# Patient Record
Sex: Male | Born: 1935
Health system: Southern US, Community
[De-identification: ages and names within clinical notes are randomized; demographics above are authoritative.]

## PROBLEM LIST (undated history)

## (undated) ENCOUNTER — Emergency Department (HOSPITAL_COMMUNITY): Admission: EM | Payer: Medicare Other

## (undated) DIAGNOSIS — F32A Depression, unspecified: Secondary | ICD-10-CM

## (undated) DIAGNOSIS — K219 Gastro-esophageal reflux disease without esophagitis: Secondary | ICD-10-CM

## (undated) DIAGNOSIS — J189 Pneumonia, unspecified organism: Secondary | ICD-10-CM

## (undated) DIAGNOSIS — Z466 Encounter for fitting and adjustment of urinary device: Secondary | ICD-10-CM

## (undated) DIAGNOSIS — Z9981 Dependence on supplemental oxygen: Secondary | ICD-10-CM

## (undated) DIAGNOSIS — F419 Anxiety disorder, unspecified: Secondary | ICD-10-CM

## (undated) DIAGNOSIS — J45909 Unspecified asthma, uncomplicated: Secondary | ICD-10-CM

## (undated) DIAGNOSIS — I4510 Unspecified right bundle-branch block: Secondary | ICD-10-CM

## (undated) DIAGNOSIS — K8020 Calculus of gallbladder without cholecystitis without obstruction: Secondary | ICD-10-CM

## (undated) DIAGNOSIS — M1990 Unspecified osteoarthritis, unspecified site: Secondary | ICD-10-CM

## (undated) DIAGNOSIS — I6529 Occlusion and stenosis of unspecified carotid artery: Secondary | ICD-10-CM

## (undated) DIAGNOSIS — K838 Other specified diseases of biliary tract: Secondary | ICD-10-CM

## (undated) DIAGNOSIS — J9610 Chronic respiratory failure, unspecified whether with hypoxia or hypercapnia: Secondary | ICD-10-CM

## (undated) DIAGNOSIS — R7989 Other specified abnormal findings of blood chemistry: Secondary | ICD-10-CM

## (undated) DIAGNOSIS — C4491 Basal cell carcinoma of skin, unspecified: Secondary | ICD-10-CM

## (undated) DIAGNOSIS — I10 Essential (primary) hypertension: Secondary | ICD-10-CM

## (undated) DIAGNOSIS — E785 Hyperlipidemia, unspecified: Secondary | ICD-10-CM

## (undated) DIAGNOSIS — J310 Chronic rhinitis: Secondary | ICD-10-CM

## (undated) DIAGNOSIS — D4959 Neoplasm of unspecified behavior of other genitourinary organ: Secondary | ICD-10-CM

## (undated) DIAGNOSIS — I2510 Atherosclerotic heart disease of native coronary artery without angina pectoris: Secondary | ICD-10-CM

## (undated) DIAGNOSIS — J449 Chronic obstructive pulmonary disease, unspecified: Secondary | ICD-10-CM

## (undated) DIAGNOSIS — I739 Peripheral vascular disease, unspecified: Secondary | ICD-10-CM

## (undated) DIAGNOSIS — I779 Disorder of arteries and arterioles, unspecified: Secondary | ICD-10-CM

## (undated) DIAGNOSIS — R55 Syncope and collapse: Secondary | ICD-10-CM

## (undated) DIAGNOSIS — Z9289 Personal history of other medical treatment: Secondary | ICD-10-CM

## (undated) DIAGNOSIS — I4891 Unspecified atrial fibrillation: Secondary | ICD-10-CM

## (undated) DIAGNOSIS — Z87442 Personal history of urinary calculi: Secondary | ICD-10-CM

## (undated) DIAGNOSIS — N400 Enlarged prostate without lower urinary tract symptoms: Secondary | ICD-10-CM

## (undated) DIAGNOSIS — I5030 Unspecified diastolic (congestive) heart failure: Secondary | ICD-10-CM

## (undated) HISTORY — DX: Chronic respiratory failure, unspecified whether with hypoxia or hypercapnia: J96.10

## (undated) HISTORY — PX: BASAL CELL CARCINOMA EXCISION: SHX1214

## (undated) HISTORY — DX: Peripheral vascular disease, unspecified: I73.9

## (undated) HISTORY — DX: Other specified diseases of biliary tract: K83.8

## (undated) HISTORY — PX: CARDIAC CATHETERIZATION: SHX172

## (undated) HISTORY — PX: LITHOTRIPSY: SUR834

## (undated) HISTORY — DX: Hyperlipidemia, unspecified: E78.5

## (undated) HISTORY — DX: Calculus of gallbladder without cholecystitis without obstruction: K80.20

## (undated) HISTORY — DX: Disorder of arteries and arterioles, unspecified: I77.9

## (undated) HISTORY — DX: Unspecified right bundle-branch block: I45.10

## (undated) HISTORY — DX: Occlusion and stenosis of unspecified carotid artery: I65.29

## (undated) HISTORY — PX: CATARACT EXTRACTION W/ INTRAOCULAR LENS  IMPLANT, BILATERAL: SHX1307

## (undated) HISTORY — DX: Chronic rhinitis: J31.0

## (undated) HISTORY — PX: TONSILLECTOMY: SUR1361

---

## 1999-12-27 ENCOUNTER — Ambulatory Visit (HOSPITAL_COMMUNITY): Admission: RE | Admit: 1999-12-27 | Discharge: 1999-12-27 | Payer: Self-pay

## 2000-05-09 ENCOUNTER — Other Ambulatory Visit: Admission: RE | Admit: 2000-05-09 | Discharge: 2000-05-09 | Payer: Self-pay | Admitting: Urology

## 2000-05-09 ENCOUNTER — Encounter (INDEPENDENT_AMBULATORY_CARE_PROVIDER_SITE_OTHER): Payer: Self-pay | Admitting: Specialist

## 2000-05-28 ENCOUNTER — Encounter: Payer: Self-pay | Admitting: Urology

## 2000-05-31 ENCOUNTER — Ambulatory Visit (HOSPITAL_COMMUNITY): Admission: RE | Admit: 2000-05-31 | Discharge: 2000-05-31 | Payer: Self-pay | Admitting: Urology

## 2000-05-31 ENCOUNTER — Encounter: Payer: Self-pay | Admitting: Urology

## 2000-11-05 ENCOUNTER — Ambulatory Visit (HOSPITAL_COMMUNITY): Admission: RE | Admit: 2000-11-05 | Discharge: 2000-11-05 | Payer: Self-pay | Admitting: Urology

## 2000-11-05 ENCOUNTER — Encounter: Payer: Self-pay | Admitting: Urology

## 2001-09-12 ENCOUNTER — Inpatient Hospital Stay (HOSPITAL_COMMUNITY): Admission: AD | Admit: 2001-09-12 | Discharge: 2001-09-23 | Payer: Self-pay | Admitting: Interventional Cardiology

## 2001-09-13 ENCOUNTER — Encounter: Payer: Self-pay | Admitting: Cardiothoracic Surgery

## 2001-09-14 ENCOUNTER — Encounter: Payer: Self-pay | Admitting: Cardiothoracic Surgery

## 2001-09-15 ENCOUNTER — Encounter: Payer: Self-pay | Admitting: Surgery

## 2001-09-17 ENCOUNTER — Encounter: Payer: Self-pay | Admitting: Cardiothoracic Surgery

## 2001-09-19 ENCOUNTER — Encounter: Payer: Self-pay | Admitting: Cardiology

## 2001-09-20 ENCOUNTER — Encounter: Payer: Self-pay | Admitting: Cardiothoracic Surgery

## 2001-09-22 ENCOUNTER — Encounter: Payer: Self-pay | Admitting: Cardiothoracic Surgery

## 2001-10-09 HISTORY — PX: CORONARY ARTERY BYPASS GRAFT: SHX141

## 2001-10-12 ENCOUNTER — Encounter (HOSPITAL_COMMUNITY): Admission: RE | Admit: 2001-10-12 | Discharge: 2002-01-10 | Payer: Self-pay | Admitting: Interventional Cardiology

## 2002-01-11 ENCOUNTER — Encounter (HOSPITAL_COMMUNITY): Admission: RE | Admit: 2002-01-11 | Discharge: 2002-01-20 | Payer: Self-pay | Admitting: Interventional Cardiology

## 2002-01-21 ENCOUNTER — Encounter (HOSPITAL_COMMUNITY): Admission: RE | Admit: 2002-01-21 | Discharge: 2002-04-21 | Payer: Self-pay | Admitting: Interventional Cardiology

## 2005-02-27 ENCOUNTER — Ambulatory Visit (HOSPITAL_COMMUNITY): Admission: RE | Admit: 2005-02-27 | Discharge: 2005-02-27 | Payer: Self-pay | Admitting: Interventional Cardiology

## 2005-03-23 ENCOUNTER — Ambulatory Visit: Payer: Self-pay | Admitting: Internal Medicine

## 2005-05-04 ENCOUNTER — Ambulatory Visit: Payer: Self-pay | Admitting: Internal Medicine

## 2005-05-22 ENCOUNTER — Ambulatory Visit: Payer: Self-pay | Admitting: Pulmonary Disease

## 2006-05-29 ENCOUNTER — Ambulatory Visit: Payer: Self-pay | Admitting: Pulmonary Disease

## 2006-05-29 ENCOUNTER — Ambulatory Visit: Payer: Self-pay | Admitting: Internal Medicine

## 2006-06-12 ENCOUNTER — Ambulatory Visit: Payer: Self-pay | Admitting: Internal Medicine

## 2006-06-27 ENCOUNTER — Ambulatory Visit: Payer: Self-pay | Admitting: Vascular Surgery

## 2007-01-28 DIAGNOSIS — E785 Hyperlipidemia, unspecified: Secondary | ICD-10-CM | POA: Insufficient documentation

## 2007-01-29 ENCOUNTER — Ambulatory Visit: Payer: Self-pay | Admitting: Internal Medicine

## 2007-01-29 DIAGNOSIS — J9612 Chronic respiratory failure with hypercapnia: Secondary | ICD-10-CM

## 2007-01-29 DIAGNOSIS — J310 Chronic rhinitis: Secondary | ICD-10-CM | POA: Insufficient documentation

## 2007-01-29 DIAGNOSIS — J449 Chronic obstructive pulmonary disease, unspecified: Secondary | ICD-10-CM | POA: Insufficient documentation

## 2007-01-29 DIAGNOSIS — J9611 Chronic respiratory failure with hypoxia: Secondary | ICD-10-CM | POA: Insufficient documentation

## 2007-07-09 ENCOUNTER — Ambulatory Visit: Payer: Self-pay | Admitting: Vascular Surgery

## 2007-08-29 ENCOUNTER — Ambulatory Visit: Payer: Self-pay | Admitting: Internal Medicine

## 2007-09-02 ENCOUNTER — Encounter: Admission: RE | Admit: 2007-09-02 | Discharge: 2007-09-02 | Payer: Self-pay | Admitting: Family Medicine

## 2007-09-30 ENCOUNTER — Encounter: Payer: Self-pay | Admitting: Internal Medicine

## 2007-10-01 ENCOUNTER — Ambulatory Visit: Payer: Self-pay | Admitting: Internal Medicine

## 2007-10-10 ENCOUNTER — Ambulatory Visit: Payer: Self-pay

## 2007-10-10 ENCOUNTER — Encounter: Payer: Self-pay | Admitting: Internal Medicine

## 2007-11-16 ENCOUNTER — Encounter: Payer: Self-pay | Admitting: Internal Medicine

## 2007-12-17 ENCOUNTER — Telehealth (INDEPENDENT_AMBULATORY_CARE_PROVIDER_SITE_OTHER): Payer: Self-pay | Admitting: *Deleted

## 2007-12-18 ENCOUNTER — Ambulatory Visit (HOSPITAL_BASED_OUTPATIENT_CLINIC_OR_DEPARTMENT_OTHER): Admission: RE | Admit: 2007-12-18 | Discharge: 2007-12-18 | Payer: Self-pay | Admitting: Internal Medicine

## 2007-12-18 ENCOUNTER — Encounter: Payer: Self-pay | Admitting: Internal Medicine

## 2007-12-24 ENCOUNTER — Ambulatory Visit: Payer: Self-pay | Admitting: Pulmonary Disease

## 2008-02-11 ENCOUNTER — Ambulatory Visit: Payer: Self-pay | Admitting: Internal Medicine

## 2008-02-11 DIAGNOSIS — G4733 Obstructive sleep apnea (adult) (pediatric): Secondary | ICD-10-CM | POA: Insufficient documentation

## 2008-02-24 ENCOUNTER — Inpatient Hospital Stay (HOSPITAL_COMMUNITY): Admission: EM | Admit: 2008-02-24 | Discharge: 2008-02-25 | Payer: Self-pay | Admitting: Emergency Medicine

## 2008-02-24 ENCOUNTER — Ambulatory Visit: Payer: Self-pay | Admitting: Internal Medicine

## 2008-03-26 ENCOUNTER — Ambulatory Visit: Payer: Self-pay | Admitting: Internal Medicine

## 2008-04-16 ENCOUNTER — Encounter: Admission: RE | Admit: 2008-04-16 | Discharge: 2008-07-08 | Payer: Self-pay | Admitting: Family Medicine

## 2008-06-05 ENCOUNTER — Ambulatory Visit: Payer: Self-pay | Admitting: Pulmonary Disease

## 2008-06-05 DIAGNOSIS — J209 Acute bronchitis, unspecified: Secondary | ICD-10-CM | POA: Insufficient documentation

## 2008-06-23 ENCOUNTER — Ambulatory Visit: Payer: Self-pay | Admitting: Vascular Surgery

## 2008-06-30 ENCOUNTER — Ambulatory Visit: Payer: Self-pay | Admitting: Internal Medicine

## 2008-10-15 ENCOUNTER — Ambulatory Visit: Payer: Self-pay | Admitting: Internal Medicine

## 2008-10-15 DIAGNOSIS — I10 Essential (primary) hypertension: Secondary | ICD-10-CM | POA: Insufficient documentation

## 2008-11-06 ENCOUNTER — Ambulatory Visit: Payer: Self-pay | Admitting: Internal Medicine

## 2008-11-18 ENCOUNTER — Telehealth (INDEPENDENT_AMBULATORY_CARE_PROVIDER_SITE_OTHER): Payer: Self-pay | Admitting: *Deleted

## 2008-11-19 ENCOUNTER — Ambulatory Visit: Payer: Self-pay | Admitting: Internal Medicine

## 2008-11-26 ENCOUNTER — Encounter (HOSPITAL_COMMUNITY): Admission: RE | Admit: 2008-11-26 | Discharge: 2009-01-08 | Payer: Self-pay | Admitting: Internal Medicine

## 2008-12-22 ENCOUNTER — Ambulatory Visit: Payer: Self-pay | Admitting: Internal Medicine

## 2008-12-23 LAB — CONVERTED CEMR LAB
BUN: 14 mg/dL (ref 6–23)
CO2: 29 meq/L (ref 19–32)
Calcium: 9.7 mg/dL (ref 8.4–10.5)
Chloride: 106 meq/L (ref 96–112)
Creatinine, Ser: 1 mg/dL (ref 0.4–1.5)
GFR calc non Af Amer: 76.93 mL/min (ref >60)
Glucose, Bld: 97 mg/dL (ref 70–99)
Potassium: 5.2 meq/L — ABNORMAL HIGH (ref 3.5–5.1)
Pro B Natriuretic peptide (BNP): 157 pg/mL — ABNORMAL HIGH (ref 0.0–100.0)
Sodium: 143 meq/L (ref 135–145)

## 2009-01-09 ENCOUNTER — Encounter (HOSPITAL_COMMUNITY): Admission: RE | Admit: 2009-01-09 | Discharge: 2009-03-15 | Payer: Self-pay | Admitting: Internal Medicine

## 2009-01-21 ENCOUNTER — Ambulatory Visit: Payer: Self-pay | Admitting: Internal Medicine

## 2009-01-21 DIAGNOSIS — R609 Edema, unspecified: Secondary | ICD-10-CM | POA: Insufficient documentation

## 2009-03-04 ENCOUNTER — Ambulatory Visit: Payer: Self-pay | Admitting: Internal Medicine

## 2009-05-03 ENCOUNTER — Ambulatory Visit: Payer: Self-pay | Admitting: Internal Medicine

## 2009-05-04 LAB — CONVERTED CEMR LAB
ALT: 20 units/L (ref 0–53)
AST: 26 units/L (ref 0–37)
Albumin: 4.6 g/dL (ref 3.5–5.2)
Alkaline Phosphatase: 80 units/L (ref 39–117)
BUN: 13 mg/dL (ref 6–23)
Bilirubin, Direct: 0.2 mg/dL (ref 0.0–0.3)
CO2: 31 meq/L (ref 19–32)
Calcium: 9.7 mg/dL (ref 8.4–10.5)
Chloride: 102 meq/L (ref 96–112)
Creatinine, Ser: 1.1 mg/dL (ref 0.4–1.5)
GFR calc non Af Amer: 68.86 mL/min (ref >60)
Glucose, Bld: 89 mg/dL (ref 70–99)
Potassium: 5.1 meq/L (ref 3.5–5.1)
Sodium: 141 meq/L (ref 135–145)
TSH: 2.76 microintl units/mL (ref 0.35–5.50)
Total Bilirubin: 1 mg/dL (ref 0.3–1.2)
Total Protein: 6.6 g/dL (ref 6.0–8.3)

## 2009-06-12 ENCOUNTER — Telehealth: Payer: Self-pay | Admitting: Internal Medicine

## 2009-06-17 ENCOUNTER — Telehealth (INDEPENDENT_AMBULATORY_CARE_PROVIDER_SITE_OTHER): Payer: Self-pay | Admitting: *Deleted

## 2009-06-24 ENCOUNTER — Ambulatory Visit: Payer: Self-pay | Admitting: Vascular Surgery

## 2009-08-19 ENCOUNTER — Telehealth (INDEPENDENT_AMBULATORY_CARE_PROVIDER_SITE_OTHER): Payer: Self-pay | Admitting: *Deleted

## 2009-08-19 ENCOUNTER — Ambulatory Visit: Payer: Self-pay | Admitting: Internal Medicine

## 2009-08-19 DIAGNOSIS — J984 Other disorders of lung: Secondary | ICD-10-CM | POA: Insufficient documentation

## 2009-09-02 ENCOUNTER — Ambulatory Visit: Payer: Self-pay | Admitting: Internal Medicine

## 2009-11-04 ENCOUNTER — Ambulatory Visit: Payer: Self-pay | Admitting: Internal Medicine

## 2009-11-08 ENCOUNTER — Telehealth: Payer: Self-pay | Admitting: Internal Medicine

## 2009-11-22 ENCOUNTER — Ambulatory Visit: Payer: Self-pay | Admitting: Internal Medicine

## 2009-11-23 ENCOUNTER — Telehealth (INDEPENDENT_AMBULATORY_CARE_PROVIDER_SITE_OTHER): Payer: Self-pay | Admitting: *Deleted

## 2009-12-15 ENCOUNTER — Telehealth: Payer: Self-pay | Admitting: Internal Medicine

## 2009-12-16 ENCOUNTER — Encounter (HOSPITAL_COMMUNITY)
Admission: RE | Admit: 2009-12-16 | Discharge: 2010-02-08 | Payer: Self-pay | Source: Home / Self Care | Attending: Internal Medicine | Admitting: Internal Medicine

## 2009-12-20 ENCOUNTER — Encounter: Payer: Self-pay | Admitting: Internal Medicine

## 2010-01-12 ENCOUNTER — Telehealth (INDEPENDENT_AMBULATORY_CARE_PROVIDER_SITE_OTHER): Payer: Self-pay | Admitting: *Deleted

## 2010-01-28 ENCOUNTER — Telehealth (INDEPENDENT_AMBULATORY_CARE_PROVIDER_SITE_OTHER): Payer: Self-pay | Admitting: *Deleted

## 2010-02-08 NOTE — Progress Notes (Signed)
Summary: neb machine rx - hold for MW to sign script  Phone Note Call from Patient Call back at Home Phone [phone]   Caller: Patient Call For: LKGM Reason for Call: Talk to Nurse Summary of Call: While patient was out of town in [place] he was started on a nebulizer (that he is renting).  Calling to ask Dr. Sherene Sires for rx for a nebulizer, so he can purchase one here. Initial call taken by: Lehman Prom,  November 08, 2009 12:12 PM  Follow-up for Phone Call        called spoke with pt who states he was [place] last month and began having SOB and wheezing while there and was started on ipratropium and xopenex while there and would like to know if MW will write an rx for this.  states he forgot to mention this at the last ov.  would like the rx for machine WRITTEN OUT rather than sent to a dme company.  refills of the nebs meds can go to the [facility] on [address].  pt stated that he could not get to his medications while i was on the phone to update the strengths.  please advise if okay for the rx's thanks!  *pt aware MW out of the office this afternoon.  okay with call back tomorrow. Follow-up by: Boone Master CNA/MA,  November 08, 2009 2:56 PM  Additional Follow-up for Phone Call Additional follow up Details #1::        the plan was to take care of all this when he returns to do med rec with tammy - if will run out before ov needs duoneb but just given enough to make it to ov, not a year's supply Additional Follow-up by: Nyoka Cowden MD,  November 08, 2009 3:16 PM    Additional Follow-up for Phone Call Additional follow up Details #2::    i'm sorry Dr. Sherene Sires, but may pt have the rx for the machine?  he stated that the rental agreement on it runs out tomorrow.  thanks!   ~ pt okay with this being done tomorrow. Boone Master CNA/MA  November 08, 2009 3:28 PM  ok Follow-up by: Nyoka Cowden MD,  November 08, 2009 3:58 PM  Additional Follow-up for Phone Call Additional follow up  Details #3:: Details for Additional Follow-up Action Taken: neb machine added to pt's med list and printed off for MW to sign.  leslie is aware.   Boone Master CNA/MA  November 08, 2009 4:33 PM   Rx for neb machine was signed by MW.  Unsure if he wants to pick this up or have it mailed.  Will place in envelope in triage.  LMOMTCB [name]  November 09, 2009 2:09 PM  pt wants to pick up rx. placed at front.pt aware.Carron Curie CMA  November 09, 2009 4:09 PM   New/Updated Medications: * NEBULIZER MACHINE OF CHOICE use as directed for breathing treatments Prescriptions: NEBULIZER MACHINE OF CHOICE use as directed for breathing treatments  #1 x 0   Entered by:   Boone Master CNA/MA   Authorized by:   Nyoka Cowden MD   Signed by:   Boone Master CNA/MA on 11/08/2009   Method used:   Print then Give to Patient   RxID:   [id number]

## 2010-02-08 NOTE — Progress Notes (Signed)
Summary: new rx / xopenex > ok  Phone Note Call from Patient Call back at [facility] Phone [phone]   Caller: Patient Call For: [name] Summary of Call: pt needs a new rx for xopenex. pt says that this was previously prescribed by another dr "[place] of state". [facility] [address] pt states that he usually gets 60 days rx.  call pt to confirm.  Initial call taken by: Tivis Ringer, CNA,  December 15, 2009 2:58 PM  Follow-up for Phone Call        called spoke with patient who verified that he is requesting that MW begin filling his xopenex 1.25mg  neb solution.  Dr. Sherene Sires, are you okay with filling this med? Boone Master CNA/MA  December 15, 2009 4:03 PM  ok  Follow-up by: Nyoka Cowden MD,  December 15, 2009 7:29 PM  Additional Follow-up for Phone Call Additional follow up Details #1::        rx sent. pt aware.Carron Curie CMA  December 16, 2009 9:07 AM     Prescriptions: XOPENEX 1.25 MG/3ML NEBU (LEVALBUTEROL HCL) inhale 1 vial via HHN two times a day  #60 x 6   Entered by:   Carron Curie CMA   Authorized by:   Nyoka Cowden MD   Signed by:   Carron Curie CMA on 12/16/2009   Method used:   Electronically to        [facility]. [phone]* (retail)       [address]       [place], [place]  [address]       Ph: [phone]       Fax: [phone]   RxID:   [id number]

## 2010-02-08 NOTE — Miscellaneous (Signed)
Summary: Orders Update pft charges  Clinical Lists Changes  Orders: Added new Service order of Carbon Monoxide diffusing w/capacity (94720) - Signed Added new Service order of Lung Volumes (94240) - Signed Added new Service order of Spirometry (Pre & Post) (94060) - Signed 

## 2010-02-08 NOTE — Assessment & Plan Note (Signed)
Summary: Pulmonary/ ext f/u ov for copd   Visit Type:  extended fu Primary Provider/Referring Provider:  Theresia [name]  CC:  3 month followup.  Pt c/o increased sob since this am.  He states that he gets sob on exertion- he states that he can get sob just walking short distances..  History of Present Illness: 77 yowm last smoked 1983  severe COPD> FEV1 of only 36% April 2007 with a 29% improvement after bronchodilators consistent with an asthmatic component.   Baseline activity level--walks 1 mile daily with oxygen on. Maintained on Symbicort 160/4.80mcg 1 puff two times a day.   October 01, 2007  doe x 300 ft slt incline without 02 walking to shed has to stop, some worse x 6 mo rec spiriva and as needed xopenox, some better with xopenex   February 11, 2008 ov never took spiriva consistently.  No variability to doe or sign cough. rec spiriva but w/in one week cp > MCH with neg cardiac wu, still having occ cp , positional, better with rubbing chest and relaxing.  March 26, 2008 doe x 30 min mile on 02, in retrospect better on spiriva.no cough. no itching sneezing or wheezing. Try spiriva every other for several weeks then take daily, think of it like high octane fuel for your car - stop spiriva if you don't feel it's helping you ex performance or if you have any side effects Please schedule a follow-up appointment in 3 months > never took   June 30, 2008 ov no longer walking on 02, does walk to shed and back without 02 and without stopping but confused with instructions on how to take rescue rx, having variable doe minimal cough no noct co'ls. Pt denies any significant sore throat, nasal congestion or excess secretions, fever, chills, sweats, unintended wt loss, pleuritic or exertional cp, orthopnea pnd or leg swelling.  Pt also denies any obvious fluctuation in symptoms with weather or environmental change or other alleviating or aggravating factors except worse in heat.  Current Medications  (verified): 1)  Toprol Xl 25 Mg  Tb24 (Metoprolol Succinate) .... Once Daily 2)  Lipitor 20 Mg  Tabs (Atorvastatin Calcium) .... Once Daily 3)  Ascriptin 325 Mg Tabs (Aspirin Buf(Alhyd-Mghyd-Cacar)) .... Once Daily 4)  Atacand 8 Mg  Tabs (Candesartan Cilexetil) .... Once Daily 5)  Multivitamins   Tabs (Multiple Vitamin) .... Once Daily 6)  Oxygen 2 Liters Qhs and Prn 7)  Symbicort 160-4.5 Mcg/act  Aero (Budesonide-Formoterol Fumarate) .Marland Kitchen.. 1-2 Puffs Every 12 Hours 8)  Xopenex Hfa 45 Mcg/act Aero (Levalbuterol Tartrate) .... 2 Every 4 Hours If Needed  Allergies (verified): 1)  ! * Omnicef 2)  Sulfa  Past History:  Past Medical History: CHRONIC RHINITIS (ICD-472.0) CHRONIC RESPIRATORY FAILURE ([id number]-782.95) COPD UNSPECIFIED (ICD-496)   - PFTs 05/04/05 FEV1 36% ratio 28% with 29% improvement after bronchodilators    DLCO 48%   - PFT's 03/26/08          30% ratio 34      with 14% improvement after bronchodilators  DLC0 38 %   - Start noct 02 at 2lpm 2008   - [facility] June 30, 2008 100%   - Rechallenge with spiriva June 30, 2008  ISCHEMIC HEART DISEASE (ICD-414.9) HYPERTENSION (ICD-401.9) HYPERLIPIDEMIA (ICD-272.4)    Vital Signs:  Patient profile:   77 year old male Weight:      160 pounds O2 Sat:      97 % on Room air Temp:  98.2 degrees F oral Pulse rate:   75 / minute BP sitting:   160 / 82  (left arm)  Vitals Entered By: [name] (June 30, 2008 11:26 AM)  O2 Flow:  Room air  Physical Exam  Additional Exam:  relatively robust  elderly white male in no acute distress but quite anxious wt  159 October 01, 2007  > wt 164 February 11, 2008 > 159 March 26, 2008 > 160 June 30, 2008  HEENT mild turbinate edema.  Oropharynx no thrush or excess pnd or cobblestoning.  No JVD or cervical adenopathy. Mild accessory muscle hypertrophy. Trachea midline, nl thryroid. Chest was hyperinflated by percussion with diminished breath sounds and marked increased exp time without wheeze.  Hoover sign positive onset of inspiration. Regular rate and rhythm without murmur gallop or rub or increase P2. Decrease s1s2  No edema Abd: no hsm, nl excursion. Ext warm without cyanosis or clubbing.   Impression & Recommendations:  Problem # 1:  COPD UNSPECIFIED (ICD-496)  the pt has severe emphysema with asthmatic component.  he has not really had a fair trial of aggressive maint rx.  I had an extended discussion with the patient today lasting 15 to 20 minutes of a 25 minute visit on the following issues:   try spiriva again. See instructions for specific recommendations   I spent extra time with the patient today explaining optimal mdi  technique.  This improved from  90-100%   Each maintenance medication was reviewed in detail including most importantly the difference between maintenance and as needed and under what circumstances the prns are to be used. See instructions for specific recommendations   Problem # 2:  HYPERTENSION (ICD-401.9) BP not great and needs more selective Beta blocker based on documented Beta agonist responsiveness  The following medications were removed from the medication list:    Toprol Xl 25 Mg Tb24 (Metoprolol succinate) ..... Once daily His updated medication list for this problem includes:    Atacand 8 Mg Tabs (Candesartan cilexetil) ..... Once daily    Bystolic 5 Mg Tabs (Nebivolol hcl) ..... One tablet daily  Medications Added to Medication List This Visit: 1)  Oxygen 2 Liters At Bedtime and With Exertion  2)  Symbicort 160-4.5 Mcg/act Aero (Budesonide-formoterol fumarate) .... 2 puffs first thing  in am and 2 puffs again in pm about 12 hours later 3)  Spiriva Handihaler 18 Mcg Caps (Tiotropium bromide monohydrate) .... Two puffs in handihaler daily 4)  Bystolic 5 Mg Tabs (Nebivolol hcl) .... One tablet daily  Complete Medication List: 1)  Lipitor 20 Mg Tabs (Atorvastatin calcium) .... Once daily 2)  Ascriptin 325 Mg Tabs (Aspirin  buf(alhyd-mghyd-cacar)) .... Once daily 3)  Atacand 8 Mg Tabs (Candesartan cilexetil) .... Once daily 4)  Multivitamins Tabs (Multiple vitamin) .... Once daily 5)  Oxygen 2 Liters At Bedtime and With Exertion  6)  Symbicort 160-4.5 Mcg/act Aero (Budesonide-formoterol fumarate) .... 2 puffs first thing  in am and 2 puffs again in pm about 12 hours later 7)  Xopenex Hfa 45 Mcg/act Aero (Levalbuterol tartrate) .... 2 every 4 hours if needed 8)  Spiriva Handihaler 18 Mcg Caps (Tiotropium bromide monohydrate) .... Two puffs in handihaler daily 9)  Bystolic 5 Mg Tabs (Nebivolol hcl) .... One tablet daily  Other Orders: Est. Patient Level IV (16109)  Patient Instructions: 1)  stop toprol and start bystolic 5 mg daily 2)  when you walk for exercise wear the oxygen 3)  continue symbicort  160 2 puffs first thing  in am and 2 puffs again in pm about 12 hours later 4)  Try spiriva every other for several weeks then take daily, think of it like high octane fuel for your car - stop spiriva if you don't feel it's helping you ex performance or if you have any side effects 5)  Please schedule a follow-up appointment in 1 month

## 2010-02-08 NOTE — Assessment & Plan Note (Signed)
Summary: Pulmonary/ fu copd/pft's/rechallenge with spiriva   Primary Provider/Referring Provider:  [name]  CC:  fu visit after pft...stopped spiriva due to complications...coughing in AM.  History of Present Illness: 77 yowm last smoked 1983  severe COPD with an FEV1 of only 36% April 2007 with a 29% improvement after bronchodilators consistent with an asthmatic component.   Baseline activity level--walks 1 mile daily with oxygen on. Maintained on Symbicort 160/4.45mcg 1 puff two times a day.   October 01, 2007  doe x 300 ft slt incline without 02 walking to shed has to stop, some worse x 6 mo rec spiriva and as needed xopenox, some better with xopenex    February 11, 2008 ov never took spiriva consistently.  No variability to doe or sign cough. rec spiriva but w/in one week cp > MCH with neg cardiac wu, still having occ cp , positional, better with rubbing chest and relaxing.  March 26, 2008 doe x 30 min mile on 02, in retrospect better on spiriva.no cough. no itching sneezing or wheezing. Pt denies any significant sore throat, nasal congestion or excess secretions, fever, chills, sweats, unintended wt loss, pleuritic or exertional cp, orthopnea pnd or leg swelling.  Pt also denies any obvious fluctuation in symptoms with weather or environmental change or other alleviating or aggravating factors.     Pt denies any increase in rescue therapy over baseline, denies waking up needing it or having early am exacerbations of coughing/wheezing/ or dyspnea   Current Medications (verified): 1)  Toprol Xl 25 Mg  Tb24 (Metoprolol Succinate) .... Once Daily 2)  Lipitor 20 Mg  Tabs (Atorvastatin Calcium) .... Once Daily 3)  Ascriptin 325 Mg Tabs (Aspirin Buf(Alhyd-Mghyd-Cacar)) .... Once Daily 4)  Atacand 8 Mg  Tabs (Candesartan Cilexetil) .... Once Daily 5)  Multivitamins   Tabs (Multiple Vitamin) .... Once Daily 6)  Oxygen 2 Liters Qhs and Prn 7)  Symbicort 160-4.5 Mcg/act  Aero  (Budesonide-Formoterol Fumarate) .Marland Kitchen.. 1-2 Puffs Every 12 Hours 8)  Xopenex Hfa 45 Mcg/act Aero (Levalbuterol Tartrate) .... 2 Every 4 Hours If Needed  Allergies (verified): 1)  Sulfa  Past History:  Past Medical History:    CHRONIC RHINITIS (ICD-472.0)    CHRONIC RESPIRATORY FAILURE (ZOX-096.04)    COPD UNSPECIFIED (ICD-496)      - PFTs 05/04/05 FEV1 36% ratio 28% with 29% improvement after bronchodilators    DLCO 48%      - PFT's 03/26/08          30% ratio 34      with 14% improvement after bronchodilators  DLC0 38 %      - Start noct 02 at 2lpm 2008    ISCHEMIC HEART DISEASE (ICD-414.9)    HYPERTENSION (ICD-401.9)    HYPERLIPIDEMIA (ICD-272.4)       Family History:    Reviewed history from 01/29/2007 and no changes required:       allergies and brother son heart disease in mother father two brothers  Social History:    Reviewed history from 01/29/2007 and no changes required:        Quit smoking 1983  Review of Systems  The patient denies shortness of breath at rest, productive cough, non-productive cough, coughing up blood, chest pain, irregular heartbeats, acid heartburn, indigestion, loss of appetite, weight change, abdominal pain, difficulty swallowing, sore throat, tooth/dental problems, headaches, nasal congestion/difficulty breathing through nose, sneezing, itching, ear ache, anxiety, depression, hand/feet swelling, joint stiffness or pain, rash, change in color of mucus, and  fever.    Vital Signs:  Patient profile:   77 year old male Height:      76 inches Weight:      159 pounds BMI:     19.42 O2 Sat:      93 % Temp:     98.1 degrees F oral Pulse rate:   75 / minute BP sitting:   110 / 60  (left arm) Cuff size:   regular  Vitals Entered By: Clarise Cruz Duncan Dull) (March 26, 2008 12:13 PM)  O2 Sat at Rest %:  93 O2 Flow:  room air  Physical Exam  Additional Exam:  relatively robust  elderly white male in no acute distress wt  159 October 01, 2007  > wt  164 February 11, 2008 > 159 March 26, 2008  HEENT mild turbinate edema.  Oropharynx no thrush or excess pnd or cobblestoning.  No JVD or cervical adenopathy. Mild accessory muscle hypertrophy. Trachea midline, nl thryroid. Chest was hyperinflated by percussion with diminished breath sounds and marked increased exp time without wheeze. Hoover sign positive onset of inspiration. Regular rate and rhythm without murmur gallop or rub or increase P2. Decrease s1s2 Abd: no hsm, nl excursion. Ext warm without C,C or E.    CXR  Procedure date:  02/24/2008  Findings:      copd only  Pulmonary Function Test Height (in.): 76 Gender: Male  Impression & Recommendations:  Problem # 1:  COPD UNSPECIFIED (ICD-496) I had an extended discussion with the patient today lasting 15 to 20 minutes of a 25 minute visit on the following issues:   I spent extra time with the patient today explaining optimal mdi  technique.  This improved from  90-100%  Pft's reviewed with pt showing minimal change since last study, classic COPD with AB and emphysema components, feel he needs  both symbicort and spiriva.  Reasonable to rechallenge with spiriva and if can't tolerate it change to combivent as "rescue"  Complete Medication List: 1)  Toprol Xl 25 Mg Tb24 (Metoprolol succinate) .... Once daily 2)  Lipitor 20 Mg Tabs (Atorvastatin calcium) .... Once daily 3)  Ascriptin 325 Mg Tabs (Aspirin buf(alhyd-mghyd-cacar)) .... Once daily 4)  Atacand 8 Mg Tabs (Candesartan cilexetil) .... Once daily 5)  Multivitamins Tabs (Multiple vitamin) .... Once daily 6)  Oxygen 2 Liters Qhs and Prn  7)  Symbicort 160-4.5 Mcg/act Aero (Budesonide-formoterol fumarate) .Marland Kitchen.. 1-2 puffs every 12 hours 8)  Xopenex Hfa 45 Mcg/act Aero (Levalbuterol tartrate) .... 2 every 4 hours if needed  Other Orders: Est. Patient Level IV (04540)  Patient Instructions: 1)  Try spiriva every other for several weeks then take daily, think of it like high  octane fuel for your car - stop spiriva if you don't feel it's helping you ex performance or if you have any side effects 2)  Please schedule a follow-up appointment in 3 months.  Appended Document: Pulmonary/ fu copd/pft's/rechallenge with spiriva copy to [facility]

## 2010-02-08 NOTE — Assessment & Plan Note (Signed)
Summary: Pulmonary/ f/u ov try short course prednisone   Primary Provider/Referring Provider:  [name]  CC:  1 month followup.  Pt states that cough has resolved.  He still increased SOB with exertion.  [name]  History of Present Illness: 35 yowm last smoked 1983  severe COPD> FEV1 of only 36% April 2007 with a 29% improvement after bronchodilators consistent with an asthmatic component.   Baseline activity level--walks 1 mile daily with oxygen on. Maintained on Symbicort 160/4.43mcg 1 puff two times a day.    February 11, 2008 ov never took spiriva consistently.  No variability to doe or sign cough. rec spiriva but w/in one week cp > MCH with neg cardiac wu, still having occ cp , positional, better with rubbing chest and relaxing.  March 26, 2008 doe x 30 min mile on 02, in retrospect better on spiriva.no cough. no itching sneezing or wheezing. Try spiriva every other for several weeks then take daily, think of it like high octane fuel for your car - stop spiriva if you don't feel it's helping you ex performance or if you have any side effects Please schedule a follow-up appointment in 3 months > never took   June 30, 2008 ov no longer walking on 02, does walk to shed and back without 02 and without stopping but confused with instructions on how to take rescue rx, having variable doe minimal cough no noct c/o's.  rec trial of spiriva.  October 15, 2008 rov follow up, about the same  did not get along well with spiriva , discont. taking caused dry mouth. still definitely benefits from as needed xopenex.    November 19, 2008--Presents for follow up. was switched from metoprolol to bystolic at last OV.  states was feeling tired, "beat up" and lethargic so began the metoprolol again 2wks ago but did not feel better.  would like to know if he may switch back to the bystolic now, and also the spiriva. to help with lung fnx.    December 22, 2008-Presents for an acute office visit. Complains of c/o  cough-clearx 1 wk increased sob, no wheezing or fcs,nasal congestion-clear. He requests to change back to metoprol , does not like bystolic b/c b/p up and down. Retaining fluids, feels more DOE.   rec doxy and mucinex dm  January 21, 2009 1 month followup.  ok on spiriva. Pt states that cough has resolved.  He still increased SOB with exertion which is reversed by xopenex but only for an hour or so and is afraid to take more due to palpitations. concerned about leg swelling but concerned furosemide might be too strong so doesn't take it often.  Pt denies any significant sore throat, dysphagia, itching, sneezing,  nasal congestion or excess secretions,  fever, chills, sweats, unintended wt loss, pleuritic or exertional cp, hempoptysis, change in activity tolerance  orthopnea pnd or leg swelling Pt also denies any obvious fluctuation in symptoms with weather or environmental change or other alleviating or aggravating factors.     Pt denies any increase in rescue therapy over baseline, denies waking up needing it or having early am exacerbations of coughing/wheezing/ or dyspnea    Current Medications (verified): 1)  Lipitor 20 Mg  Tabs (Atorvastatin Calcium) .... Once Daily 2)  Ascriptin 325 Mg Tabs (Aspirin Buf(Alhyd-Mghyd-Cacar)) .... Once Daily 3)  Atacand 8 Mg  Tabs (Candesartan Cilexetil) .... Once Daily 4)  Multivitamins   Tabs (Multiple Vitamin) .... Once Daily 5)  Oxygen 2 Liters  At Bedtime and With Exertion 6)  Symbicort 160-4.5 Mcg/act  Aero (Budesonide-Formoterol Fumarate) .... 2 Puffs First Thing  in Am and 2 Puffs Again in Pm About 12 Hours Later 7)  Xopenex Hfa 45 Mcg/act Aero (Levalbuterol Tartrate) .... 2 Every 4 Hours If Needed 8)  Spiriva Handihaler 18 Mcg Caps (Tiotropium Bromide Monohydrate) .[name].. 1 Puff Once Daily 9)  Furosemide 20 Mg Tabs (Furosemide) .[name].. 1 By Mouth Once Daily As Needed Leg Swelling 10)  Toprol Xl 25 Mg Xr24h-Tab (Metoprolol Succinate) .[name].. 1 Once  Daily  Allergies (verified): 1)  ! Sulfa 2)  ! Truman Hayward  Past History:  Past Medical History: CHRONIC RHINITIS (ICD-472.0) CHRONIC RESPIRATORY FAILURE (EAV-409.81) COPD UNSPECIFIED (ICD-496).................................................................Travis KitchenWert   - PFTs 05/04/05 FEV1 36% ratio 28% with 29% improvement after bronchodilators    DLCO 48%   - PFT's 03/26/08          30% ratio 34      with 14% improvement after bronchodilators  DLC0 38 %   - Start noct 02 at 2lpm 2008   - [facility] June 30, 2008 100%   - Rechallenge with spiriva June 30, 2008 >  rechallenged Dec 2011 ISCHEMIC HEART DISEASE (ICD-414.9) HYPERTENSION (ICD-401.9) HYPERLIPIDEMIA (ICD-272.4)    Vital Signs:  Patient profile:   77 year old male Weight:      158.50 pounds O2 Sat:      97 % on Room air Temp:     97.0 degrees F oral Pulse rate:   60 / minute BP sitting:   140 / 70  (left arm)  Vitals Entered By: [name] (January 21, 2009 11:30 AM)  O2 Flow:  Room air  Physical Exam  Additional Exam:  relatively robust  elderly white male in no acute distress wt  159 October 01, 2007  > wt 164 February 11, 2008 > 159 March 26, 2008 > 158  January 21, 2009  HEENT mild turbinate edema.  Oropharynx no thrush or excess pnd or cobblestoning.  No JVD or cervical adenopathy. Mild accessory muscle hypertrophy. Trachea midline, nl thryroid. Chest was hyperinflated by percussion with diminished breath sounds and marked increased exp time without wheeze. Hoover sign positive onset of inspiration. Regular rate and rhythm without murmur gallop or rub or increase P2. Decrease s1s2 Abd: no hsm, nl excursion. Ext warm without cyanosis or clubbing, 1+ edema    Impression & Recommendations:  Problem # 1:  COPD UNSPECIFIED (ICD-496)  DDX of  difficult airways managment all start with A and  include Adherence, Ace Inhibitors, Acid Reflux, Active Sinus Disease, Alpha 1 Antitripsin deficiency, Anxiety masquerading as Airways  dz,  ABPA,  allergy(esp in young), Aspiration (esp in elderly), Adverse effects of DPI,  Active smokers, plus one B  = Beta blocker use..    Adherence is an issue because he can't really take deep breaths on symbicort when he's air trapping and declines using xopenex more aggressively.  rec short course prednisone to see if we can achieve better "high water mark" then maintain it on symbicort with the other option : change to Brovana and Budesonide, the equivalent of Symbicort, per neb.    Each maintenance medication was reviewed in detail including most importantly the difference between maintenance and as needed and under what circumstances the prns are to be used. See instructions for specific recommendations   Problem # 2:  EDEMA (ICD-782.3)  likely element of cor pulmonale, reviewed approp rx with as needed furosemide and 02  Orders: Est. Patient Level IV ([address])  Medications Added to Medication List This Visit: 1)  Toprol Xl 25 Mg Xr24h-tab (Metoprolol succinate) .[name].. 1 once daily 2)  Prednisone 10 Mg Tabs (Prednisone) .... 4 each am x 2days, 2x2days, 1x2days and stop  Patient Instructions: 1)  Prednisone 10 mg  4 each am x 2days, 2x2days, 1x2days and stop 2)  Please schedule a follow-up appointment in 6 weeks, sooner if needed   Prescriptions: PREDNISONE 10 MG  TABS (PREDNISONE) 4 each am x 2days, 2x2days, 1x2days and stop  #14 x 0   Entered and Authorized by:   Nyoka Cowden MD   Signed by:   Nyoka Cowden MD on 01/21/2009   Method used:   Electronically to        [facility]. [phone]* (retail)       [address]       [place], [place]  [address]       Ph: [phone]       Fax: [phone]   RxID:   [id number]

## 2010-02-08 NOTE — Assessment & Plan Note (Signed)
Summary: sob/jd   Primary Provider/Referring Provider:  [name]  CC:  c/o cough-clearx 1 wk., increased sob, no wheezing or fcs, and nasal congestion-clear.  History of Present Illness: 77 yowm last smoked 1983  severe COPD> FEV1 of only 36% April 2007 with a 29% improvement after bronchodilators consistent with an asthmatic component.   Baseline activity level--walks 1 mile daily with oxygen on. Maintained on Symbicort 160/4.60mcg 1 puff two times a day.    February 11, 2008 ov never took spiriva consistently.  No variability to doe or sign cough. rec spiriva but w/in one week cp > MCH with neg cardiac wu, still having occ cp , positional, better with rubbing chest and relaxing.  March 26, 2008 doe x 30 min mile on 02, in retrospect better on spiriva.no cough. no itching sneezing or wheezing. Try spiriva every other for several weeks then take daily, think of it like high octane fuel for your car - stop spiriva if you don't feel it's helping you ex performance or if you have any side effects Please schedule a follow-up appointment in 3 months > never took   June 30, 2008 ov no longer walking on 02, does walk to shed and back without 02 and without stopping but confused with instructions on how to take rescue rx, having variable doe minimal cough no noct c/o's.  rec trial of spiriva.  October 15, 2008 rov follow up, about the same  did not get along well with spiriva , discont. taking caused dry mouth. still definitely benefits from as needed xopenex.    November 19, 2008--Presents for follow up. was switched from metoprolol to bystolic at last OV.  states was feeling tired, "beat up" and lethargic so began the metoprolol again 2wks ago but did not feel better.  would like to know if he may switch back to the bystolic now, and also the spiriva. to help with lung fnx.    December 22, 2008-Presents for an acute office visit. Complains of c/o cough-clearx 1 wk.,increased sob, no wheezing or  fcs,nasal congestion-clear. He requests to change back to metoprol , does not like bystolic b/c b/p up and down. Retaining fluids, feels more DOE. Denies chest pain, orthopnea, hemoptysis, fever, n/v/d/.  Current Medications (verified): 1)  Lipitor 20 Mg  Tabs (Atorvastatin Calcium) .... Once Daily 2)  Ascriptin 325 Mg Tabs (Aspirin Buf(Alhyd-Mghyd-Cacar)) .... Once Daily 3)  Atacand 8 Mg  Tabs (Candesartan Cilexetil) .... Once Daily 4)  Multivitamins   Tabs (Multiple Vitamin) .... Once Daily 5)  Oxygen 2 Liters At Bedtime and With Exertion 6)  Symbicort 160-4.5 Mcg/act  Aero (Budesonide-Formoterol Fumarate) .... 2 Puffs First Thing  in Am and 2 Puffs Again in Pm About 12 Hours Later 7)  Xopenex Hfa 45 Mcg/act Aero (Levalbuterol Tartrate) .... 2 Every 4 Hours If Needed 8)  Bystolic 5 Mg  Tabs (Nebivolol Hcl) .... One Tablet Daily 9)  Spiriva Handihaler 18 Mcg Caps (Tiotropium Bromide Monohydrate) .Marland Kitchen.. 1 Puff Once Daily  Allergies (verified): 1)  ! Sulfa 2)  ! Truman Hayward  Past History:  Past Medical History: Last updated: 10/15/2008 CHRONIC RHINITIS (ICD-472.0) CHRONIC RESPIRATORY FAILURE (ICD-518.83) COPD UNSPECIFIED (ICD-496)................................................................[name]   - PFTs 05/04/05 FEV1 36% ratio 28% with 29% improvement after bronchodilators    DLCO 48%   - PFT's 03/26/08          30% ratio 34      with 14% improvement after bronchodilators  DLC0 38 %   -  Start noct 02 at 2lpm 2008   - [facility] June 30, 2008 100%   - Rechallenge with spiriva June 30, 2008 > dry mouth couldn't tolerate ISCHEMIC HEART DISEASE (ICD-414.9) HYPERTENSION (ICD-401.9) HYPERLIPIDEMIA (ICD-272.4)    Family History: Last updated: 11/19/2008 heart disease - mother father two brothers cancer - mother(breast) stroke - older brother x3, still living at 9 allergies - brother  Social History: Last updated: 11/19/2008  Quit smoking 1983 - x40yrs, 1.5-2ppd. 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  Risk Factors: Smoking Status: quit (01/29/2007)  Review of Systems      See HPI  Vital Signs:  Patient profile:   77 year old male Height:      69 inches Weight:      163.13 pounds O2 Sat:      96 % on Room air Temp:     96.8 degrees F oral Pulse rate:   66 / minute BP sitting:   158 / 90  (left arm) Cuff size:   regular  Vitals Entered By: Elray Buba RN (December 22, 2008 11:46 AM)  O2 Flow:  Room air CC: c/o cough-clearx 1 wk.,increased sob, no wheezing or fcs,nasal congestion-clear Is Patient Diabetic? No Comments Medications reviewed with patient Elray Buba RN  December 22, 2008 11:47 AM    Physical Exam  Additional Exam:  relatively robust  elderly white male in no acute distress wt  159 October 01, 2007  > wt 164 February 11, 2008 > 159 March 26, 2008 > 160 June 30, 2008 > 158 October 15, 2008 >>157>>163 12/22/08 HEENT mild turbinate edema.  Oropharynx no thrush or excess pnd or cobblestoning.  No JVD or cervical adenopathy. Mild accessory muscle hypertrophy. Trachea midline, nl thryroid. Chest was hyperinflated by percussion with diminished breath sounds and marked increased exp time without wheeze. Hoover sign positive onset of inspiration. Regular rate and rhythm without murmur gallop or rub or increase P2. Decrease s1s2 Abd: no hsm, nl excursion. Ext warm without cyanosis or clubbing, 1+ edema    Impression & Recommendations:  Problem # 1:  HYPERTENSION, BENIGN (ICD-401.1) Intolerance to meds w/ labile b/p at home 120-160 systolic.  will check labs and get pt back to primary to manage b/p issues mild fluid retention today on exam, use lasix short term.  REC:  Can change back to  Metoprolol 25mg  once daily  follow up with your family doctor concerning your blood pressure.  Lasix 20mg  once daily for 2 days then daily as needed leg  swelling labs pending.    Please contact office for sooner follow up if symptoms do not improve or worsen   Problem # 2:  BRONCHITIS, ACUTE (ICD-466.0) xray pending. labs w/ bnp/bmet pending.  REC:  Doxcycline 100mg  two times a day for 7 days  Mucinex DM two times a day as needed cough/congestion  follow up 1  months Dr. Sherene Sires  Please contact office for sooner follow up if symptoms do not improve or worsen  His updated medication list for this problem includes:    Symbicort 160-4.5 Mcg/act Aero (Budesonide-formoterol fumarate) .Marland Kitchen... 2 puffs first thing  in am and 2 puffs again in pm about 12 hours later    Xopenex Hfa 45 Mcg/act Aero (Levalbuterol tartrate) .Marland Kitchen... 2 every 4 hours if needed    Spiriva Handihaler 18 Mcg Caps (Tiotropium bromide monohydrate) .Marland Kitchen... 1 puff once daily  Doxycycline Hyclate 100 Mg Caps (Doxycycline hyclate) .Marland Kitchen... 1 by mouth two times a day  Orders: Nebulizer Tx (78469) T-2 View CXR (71020TC) Est. Patient Level IV (62952)  Medications Added to Medication List This Visit: 1)  Doxycycline Hyclate 100 Mg Caps (Doxycycline hyclate) .Marland Kitchen.. 1 by mouth two times a day 2)  Furosemide 20 Mg Tabs (Furosemide) .Marland Kitchen.. 1 by mouth once daily as needed leg swelling  Complete Medication List: 1)  Lipitor 20 Mg Tabs (Atorvastatin calcium) .... Once daily 2)  Ascriptin 325 Mg Tabs (Aspirin buf(alhyd-mghyd-cacar)) .... Once daily 3)  Atacand 8 Mg Tabs (Candesartan cilexetil) .... Once daily 4)  Multivitamins Tabs (Multiple vitamin) .... Once daily 5)  Oxygen 2 Liters At Bedtime and With Exertion  6)  Symbicort 160-4.5 Mcg/act Aero (Budesonide-formoterol fumarate) .... 2 puffs first thing  in am and 2 puffs again in pm about 12 hours later 7)  Xopenex Hfa 45 Mcg/act Aero (Levalbuterol tartrate) .... 2 every 4 hours if needed 8)  Bystolic 5 Mg Tabs (Nebivolol hcl) .... One tablet daily 9)  Spiriva Handihaler 18 Mcg Caps (Tiotropium bromide monohydrate) .Marland Kitchen.. 1 puff once daily 10)   Doxycycline Hyclate 100 Mg Caps (Doxycycline hyclate) .Marland Kitchen.. 1 by mouth two times a day 11)  Furosemide 20 Mg Tabs (Furosemide) .Marland Kitchen.. 1 by mouth once daily as needed leg swelling  Other Orders: TLB-BMP (Basic Metabolic Panel-BMET) (80048-METABOL) TLB-BNP (B-Natriuretic Peptide) (83880-BNPR)  Patient Instructions: 1)   Doxcycline 100mg  two times a day for 7 days  2)  Mucinex DM two times a day as needed cough/congestion  3)  Can change back to  Metoprolol 25mg  once daily  4)  follow up with your family doctor concerning your blood pressure.  5)  Lasix 20mg  once daily for 2 days then daily as needed leg swelling 6)  I will call with labs.  7)  follow up 1  months Dr. Sherene Sires  8)  Please contact office for sooner follow up if symptoms do not improve or worsen  Prescriptions: FUROSEMIDE 20 MG TABS (FUROSEMIDE) 1 by mouth once daily as needed leg swelling  #30 x 3   Entered and Authorized by:   Rubye Oaks NP   Signed by:   Rubye Oaks NP on 12/22/2008   Method used:   Electronically to        [facility]. [phone]* (retail)       [address]       [place], [place]  [address]       Ph: [phone]       Fax: [phone]   RxID:   [id number] DOXYCYCLINE HYCLATE 100 MG CAPS (DOXYCYCLINE HYCLATE) 1 by mouth two times a day  #14 x 0   Entered and Authorized by:   Rubye Oaks NP   Signed by:   Rubye Oaks NP on 12/22/2008   Method used:   Electronically to        [facility]. [phone]* (retail)       [address]       [place], [place]  [address]       Ph: [phone]       Fax: [phone]   RxID:   [id number]      Medication Administration  Medication # 1:    Medication: Xopenex 1.25mg     Diagnosis: BRONCHITIS, ACUTE (ICD-466.0)    Dose: 1.25mg   Route: inhaled    Exp Date: 06-11    Lot #: [id number]    Mfr: SEPRACOR    Patient tolerated medication without complications    Given by: Elray Buba RN (December 22, 2008 12:19 PM)  Orders Added: 1)  Nebulizer Tx [phone] 2)  T-2 View CXR [71020TC] 3)  TLB-BMP (Basic Metabolic Panel-BMET) [80048-METABOL] 4)  TLB-BNP (B-Natriuretic Peptide) [83880-BNPR] 5)  Est. Patient Level IV [78295]

## 2010-02-08 NOTE — Progress Notes (Signed)
Summary: rx question  Phone Note Call from Patient Call back at Home Phone [phone]   Caller: Patient Call For: wert Summary of Call: pt is calling back with another question about his predisone Initial call taken by: Lacinda Axon,  August 19, 2009 2:07 PM  Follow-up for Phone Call        Rx for prednisone was not sent at [facility] so I sent to pharm for pt.  Follow-up by: [name],  August 19, 2009 2:13 PM    New/Updated Medications: PREDNISONE 10 MG TABS (PREDNISONE) 4 each am x 3 days, 3 each am x 3 days, 2 each am x 3 days, 1 each am x 3 days, then stop Prescriptions: PREDNISONE 10 MG TABS (PREDNISONE) 4 each am x 3 days, 3 each am x 3 days, 2 each am x 3 days, 1 each am x 3 days, then stop  #30 x 0   Entered by:   [name]   Authorized by:   Nyoka Cowden MD   Signed by:   [name] on 08/19/2009   Method used:   Electronically to        [facility]. [phone]* (retail)       [address]       [place], [place]  [address]       Ph: [phone]       Fax: [phone]   RxID:   [id number]

## 2010-02-08 NOTE — Assessment & Plan Note (Signed)
Summary: Pulmonary/ fu copd, mild osa   PCP:  [name]  Chief Complaint:  Followup on PSG results.  He states that his breathing is about the same no better or worse.  No new complaints.  History of Present Illness: 22 yowm last smoked 1983  severe COPD with an FEV1 of only 36% predicted documented April 2007 with a 29% improvement after bronchodilators consistent with an asthmatic component.   Baseline activity level--walks 1 mile daily with oxygen on. Maintained on Symbicort 160/4.62mcg 1 puff two times a day.    August 29, 2007 -- 6 month follow up Since last visit doing well without significant change in activity tolerance. Gets stuffy nose at night with O2.  October 01, 2007  doe x 300 ft slt incline without 02 walking shed has to stop, some worse x 6 mo rec spiriva and as needed xopenox, some better with xopenex    February 11, 2008 ov never took spiriva consistently.  No variability to doe or sign cough. Pt denies any significant sore throat, nasal congestion or excess secretions, fever, chills, sweats, unintended wt loss, pleuritic or exertional cp, orthopnea pnd or leg swelling.  Pt also denies any obvious fluctuation in symptoms with weather or environmental change or other alleviating or aggravating factors.     Pt denies any increase in rescue therapy over baseline, denies waking up needing it or having early am exacerbations of coughing/wheezing/ or dyspnea       Updated Prior Medication List: TOPROL XL 25 MG  TB24 (METOPROLOL SUCCINATE) once daily LIPITOR 20 MG  TABS (ATORVASTATIN CALCIUM) once daily ADULT ASPIRIN LOW STRENGTH 81 MG  TBDP (ASPIRIN) once daily ATACAND 8 MG  TABS (CANDESARTAN CILEXETIL) once daily MULTIVITAMINS   TABS (MULTIPLE VITAMIN) once daily * OXYGEN 2 LITERS QHS AND PRN  SYMBICORT 160-4.5 MCG/ACT  AERO (BUDESONIDE-FORMOTEROL FUMARATE) 2 puffs every 12 hours XOPENEX HFA 45 MCG/ACT AERO (LEVALBUTEROL TARTRATE) 2 every 4 hours if needed  Current  Allergies (reviewed today): SULFA  Past Medical History:    CHRONIC RHINITIS (ICD-472.0)    CHRONIC RESPIRATORY FAILURE (ZOX-096.04)    COPD UNSPECIFIED (ICD-496)      - PFTs 05/04/05 FEV1 36% ratio 28% with 29% improvement after bronchodilators and DLCO 48%      - Start noct 02 at 2lpm 2008    ISCHEMIC HEART DISEASE (ICD-414.9)    HYPERTENSION (ICD-401.9)    HYPERLIPIDEMIA (ICD-272.4)        Family History:    Reviewed history from 01/29/2007 and no changes required:       allergies and brother son heart disease in mother father two brothers  Social History:    Reviewed history from 01/29/2007 and no changes required:        Quit smoking 1983    Review of Systems  The patient denies anorexia, fever, weight loss, weight gain, vision loss, decreased hearing, hoarseness, chest pain, syncope, peripheral edema, prolonged cough, headaches, hemoptysis, abdominal pain, melena, hematochezia, severe indigestion/heartburn, hematuria, incontinence, muscle weakness, suspicious skin lesions, transient blindness, difficulty walking, depression, unusual weight change, abnormal bleeding, and enlarged lymph nodes.     Vital Signs:  Patient Profile:   77 Years Old Male Weight:      164 pounds O2 Sat:      94 % O2 treatment:    Room Air Temp:     97.3 degrees F oral Pulse rate:   79 / minute BP sitting:   130 / 70  (left arm)  Vitals Entered By: [name] (February 11, 2008 10:08 AM)                 Physical Exam  relatively robust  elderly white male in no acute distress wt 1 159 October 01, 2007  > wt 164 February 11, 2008  HEENT mild turbinate edema.  Oropharynx no thrush or excess pnd or cobblestoning.  No JVD or cervical adenopathy. Mild accessory muscle hypertrophy. Trachea midline, nl thryroid. Chest was hyperinflated by percussion with diminished breath sounds and marked increased exp time without wheeze. Hoover sign positive onset of inspiration. Regular rate and rhythm  without murmur gallop or rub or increase P2. Decrease s1s2 Abd: no hsm, nl excursion. Ext warm without C,C or E.       Problem # 1:  COPD UNSPECIFIED (ICD-496) I had an extended discussion with the patient today lasting 15 to 20 minutes of a 25 minute visit on the following issues:   Although severe, he does have evidence of reversibility by previous PFT's.    I spent extra time with the patient today explaining optimal mdi  and dpi technique.  This improved from  75 - 100%   Each maintenance medication was reviewed in detail including most importantly the difference between maintenance and as needed and under what circumstances the prns are to be used. See instructions for specific recommendations    His updated medication list for this problem includes:    Symbicort 160-4.5 Mcg/act Aero (Budesonide-formoterol fumarate) .Marland Kitchen... 1-2 puffs every 12 hours    Xopenex Hfa 45 Mcg/act Aero (Levalbuterol tartrate) .Marland Kitchen... 2 every 4 hours if needed    Spiriva Handihaler 18 Mcg Caps (Tiotropium bromide monohydrate) .Marland Kitchen..Marland Kitchen Two puffs in handihaler daily   Problem # 2:  OBSTRUCTIVE SLEEP APNEA (ICD-327.23) study reviewed, quite mild, no excess daytime hypersomnolence.  did document needs to wear 02 at hs, reinforced  Medications Added to Medication List This Visit: 1)  Spiriva Handihaler 18 Mcg Caps (Tiotropium bromide monohydrate) .... Two puffs in handihaler daily  Complete Medication List: 1)  Toprol Xl 25 Mg Tb24 (Metoprolol succinate) .... Once daily 2)  Lipitor 20 Mg Tabs (Atorvastatin calcium) .... Once daily 3)  Adult Aspirin Low Strength 81 Mg Tbdp (Aspirin) .... Once daily 4)  Atacand 8 Mg Tabs (Candesartan cilexetil) .... Once daily 5)  Multivitamins Tabs (Multiple vitamin) .... Once daily 6)  Oxygen 2 Liters Qhs and Prn  7)  Symbicort 160-4.5 Mcg/act Aero (Budesonide-formoterol fumarate) .Marland Kitchen.. 1-2 puffs every 12 hours 8)  Xopenex Hfa 45 Mcg/act Aero (Levalbuterol tartrate) .... 2 every  4 hours if needed 9)  Spiriva Handihaler 18 Mcg Caps (Tiotropium bromide monohydrate) .... Two puffs in handihaler daily   Patient Instructions: 1)  restart spiriva each am to see if it improves your activity tolerance 2)  continue symbicort 160 2 every 12 hours 3)  Please schedule a follow-up appointment in 6 weeks with PFT's

## 2010-02-08 NOTE — Progress Notes (Signed)
Summary: OXYGEN  Phone Note Call from Patient Call back at [phone]   Caller: Patient Call For: WERT Summary of Call: PT IS HAVING SLEEP STUDY DONE ON TOMORROW AND HE WANT TO KNOW IF HE SHOULD USE HIS OXYGEN DURING THE STUDY. Initial call taken by: Rickard Patience,  December 17, 2007 3:57 PM  Follow-up for Phone Call        Yes if pt wears o2 at bedtime he does need to use during sleep study.  Spoke with pt and made aware. Follow-up by: [name],  December 17, 2007 4:01 PM

## 2010-02-08 NOTE — Assessment & Plan Note (Signed)
Summary: discuss bp med/sob/lmr   Primary Provider/Referring Provider:  [name]  CC:  was switched from metoprolol to bystolic at last OV.  states was feeling tired, "beat up" and lethargic so began the metoprolol again 2wks ago but did not feel better.  would like to know if he may switch back to the bystolic now, and and also the spiriva..  History of Present Illness: 36 yowm last smoked 1983  severe COPD> FEV1 of only 36% April 2007 with a 29% improvement after bronchodilators consistent with an asthmatic component.   Baseline activity level--walks 1 mile daily with oxygen on. Maintained on Symbicort 160/4.83mcg 1 puff two times a day.   October 01, 2007  doe x 300 ft slt incline without 02 walking to shed has to stop, some worse x 6 mo rec spiriva and as needed xopenox, some better with xopenex   February 11, 2008 ov never took spiriva consistently.  No variability to doe or sign cough. rec spiriva but w/in one week cp > MCH with neg cardiac wu, still having occ cp , positional, better with rubbing chest and relaxing.  March 26, 2008 doe x 30 min mile on 02, in retrospect better on spiriva.no cough. no itching sneezing or wheezing. Try spiriva every other for several weeks then take daily, think of it like high octane fuel for your car - stop spiriva if you don't feel it's helping you ex performance or if you have any side effects Please schedule a follow-up appointment in 3 months > never took   June 30, 2008 ov no longer walking on 02, does walk to shed and back without 02 and without stopping but confused with instructions on how to take rescue rx, having variable doe minimal cough no noct c/o's.  rec trial of spiriva.  October 15, 2008 rov follow up, about the same  did not get along well with spiriva , discont. taking caused dry mouth. still definitely benefits from as needed xopenex.    November 19, 2008--Presents for follow up. was switched from metoprolol to bystolic at last OV.   states was feeling tired, "beat up" and lethargic so began the metoprolol again 2wks ago but did not feel better.  would like to know if he may switch back to the bystolic now, and also the spiriva. to help with lung fnx. Denies chest pain,  orthopnea, hemoptysis, fever, n/v/d, edema, headache.   Medications Prior to Update: 1)  Lipitor 20 Mg  Tabs (Atorvastatin Calcium) .... Once Daily 2)  Ascriptin 325 Mg Tabs (Aspirin Buf(Alhyd-Mghyd-Cacar)) .... Once Daily 3)  Atacand 8 Mg  Tabs (Candesartan Cilexetil) .... Once Daily 4)  Multivitamins   Tabs (Multiple Vitamin) .... Once Daily 5)  Oxygen 2 Liters At Bedtime and With Exertion 6)  Symbicort 160-4.5 Mcg/act  Aero (Budesonide-Formoterol Fumarate) .... 2 Puffs First Thing  in Am and 2 Puffs Again in Pm About 12 Hours Later 7)  Xopenex Hfa 45 Mcg/act Aero (Levalbuterol Tartrate) .... 2 Every 4 Hours If Needed 8)  Bystolic 5 Mg  Tabs (Nebivolol Hcl) .... One Tablet Daily  Current Medications (verified): 1)  Lipitor 20 Mg  Tabs (Atorvastatin Calcium) .... Once Daily 2)  Ascriptin 325 Mg Tabs (Aspirin Buf(Alhyd-Mghyd-Cacar)) .... Once Daily 3)  Atacand 8 Mg  Tabs (Candesartan Cilexetil) .... Once Daily 4)  Multivitamins   Tabs (Multiple Vitamin) .... Once Daily 5)  Oxygen 2 Liters At Bedtime and With Exertion 6)  Symbicort 160-4.5 Mcg/act  Aero (  Budesonide-Formoterol Fumarate) .... 2 Puffs First Thing  in Am and 2 Puffs Again in Pm About 12 Hours Later 7)  Xopenex Hfa 45 Mcg/act Aero (Levalbuterol Tartrate) .... 2 Every 4 Hours If Needed 8)  Bystolic 5 Mg  Tabs (Nebivolol Hcl) .... One Tablet Daily  Allergies (verified): 1)  ! * Omnicef 2)  Sulfa  Past History:  Past Medical History: Last updated: 10/15/2008 CHRONIC RHINITIS (ICD-472.0) CHRONIC RESPIRATORY FAILURE (ICD-518.83) COPD UNSPECIFIED (ICD-496)................................................................[name]   - PFTs 05/04/05 FEV1 36% ratio 28% with 29% improvement after  bronchodilators    DLCO 48%   - PFT's 03/26/08          30% ratio 34      with 14% improvement after bronchodilators  DLC0 38 %   - Start noct 02 at 2lpm 2008   - [facility] June 30, 2008 100%   - Rechallenge with spiriva June 30, 2008 > dry mouth couldn't tolerate ISCHEMIC HEART DISEASE (ICD-414.9) HYPERTENSION (ICD-401.9) HYPERLIPIDEMIA (ICD-272.4)    Family History: Last updated: 11/19/2008 heart disease - mother father two brothers cancer - mother(breast) stroke - older brother x3, still living at 51 allergies - brother  Social History: Last updated: 11/19/2008  Quit smoking 1983 - x72yrs, 1.5-2ppd. 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  Risk Factors: Smoking Status: quit (01/29/2007)  Family History: heart disease - mother father two brothers cancer - mother(breast) stroke - older brother x3, still living at 49 allergies - brother  Social History:  Quit smoking 1983 - x80yrs, 1.5-2ppd. 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  Vital Signs:  Patient profile:   77 year old male Height:      76 inches Weight:      157.25 pounds BMI:     19.21 O2 Sat:      97 % on Room air Temp:     97.1 degrees F oral Pulse rate:   68 / minute BP sitting:   146 / 68  (left arm) Cuff size:   regular  Vitals Entered By: Boone Master CNA (November 19, 2008 4:25 PM)  O2 Flow:  Room air CC: was switched from metoprolol to bystolic at last OV.  states was feeling tired, "beat up" and lethargic so began the metoprolol again 2wks ago but did not feel better.  would like to know if he may switch back to the bystolic now, and also the spiriva. Is Patient Diabetic? No Comments Medications reviewed with patient Daytime contact number verified with patient. Boone Master CNA  November 19, 2008 4:26 PM      Physical Exam  Additional Exam:  relatively robust  elderly white male in no acute distress wt  159 October 01, 2007  > wt 164 February 11, 2008 > 159 March 26, 2008 > 160 June 30, 2008 > 158 October 15, 2008 >>157 HEENT mild turbinate edema.  Oropharynx no thrush or excess pnd or cobblestoning.  No JVD or cervical adenopathy. Mild accessory muscle hypertrophy. Trachea midline, nl thryroid. Chest was hyperinflated by percussion with diminished breath sounds and marked increased exp time without wheeze. Hoover sign positive onset of inspiration. Regular rate and rhythm without murmur gallop or rub or increase P2. Decrease s1s2  No edema Abd: no hsm, nl excursion. Ext warm without cyanosis or clubbing.   Impression & Recommendations:  Problem # 1:  COPD UNSPECIFIED (ICD-496)  Severe COPD , will add back in spiriva in hopes to improve activity tolerance and decrease exacerbations begin pulmoanry rehab follow up 3 months Dr. Sherene Sires  His updated medication list for this problem includes:    Symbicort 160-4.5 Mcg/act Aero (Budesonide-formoterol fumarate) .Marland Kitchen... 2 puffs first thing  in am and 2 puffs again in pm about 12 hours later    Xopenex Hfa 45 Mcg/act Aero (Levalbuterol tartrate) .Marland Kitchen... 2 every 4 hours if needed    Spiriva Handihaler 18 Mcg Caps (Tiotropium bromide monohydrate) .Marland Kitchen... 1 puff once daily  Orders: Est. Patient Level III (25956)  Medications Added to Medication List This Visit: 1)  Spiriva Handihaler 18 Mcg Caps (Tiotropium bromide monohydrate) .Marland Kitchen.. 1 puff once daily  Complete Medication List: 1)  Lipitor 20 Mg Tabs (Atorvastatin calcium) .... Once daily 2)  Ascriptin 325 Mg Tabs (Aspirin buf(alhyd-mghyd-cacar)) .... Once daily 3)  Atacand 8 Mg Tabs (Candesartan cilexetil) .... Once daily 4)  Multivitamins Tabs (Multiple vitamin) .... Once daily 5)  Oxygen 2 Liters At Bedtime and With Exertion  6)  Symbicort 160-4.5 Mcg/act Aero (Budesonide-formoterol fumarate) .... 2  puffs first thing  in am and 2 puffs again in pm about 12 hours later 7)  Xopenex Hfa 45 Mcg/act Aero (Levalbuterol tartrate) .... 2 every 4 hours if needed 8)  Bystolic 5 Mg Tabs (Nebivolol hcl) .... One tablet daily 9)  Spiriva Handihaler 18 Mcg Caps (Tiotropium bromide monohydrate) .Marland Kitchen.. 1 puff once daily  Patient Instructions: 1)  Change back to bystolic 5mg  once daily  2)  Restart Spriiva once daily  3)  follow up 3 months Dr. Sherene Sires  4)  Pulmonary rehab as scheduled.  Prescriptions: SPIRIVA HANDIHALER 18 MCG CAPS (TIOTROPIUM BROMIDE MONOHYDRATE) 1 puff once daily  #1 x 5   Entered and Authorized by:   Rubye Oaks NP   Signed by:   Rubye Oaks NP on 11/19/2008   Method used:   Electronically to        [facility]. [phone]* (retail)       [address]       [place], [place]  [address]       Ph: [phone]       Fax: [phone]   RxID:   [id number] BYSTOLIC 5 MG  TABS (NEBIVOLOL HCL) One tablet daily  #30 x 5   Entered and Authorized by:   Rubye Oaks NP   Signed by:   Rubye Oaks NP on 11/19/2008   Method used:   Electronically to        [facility]. [phone]* (retail)       [address]       [place], [place]  [address]       Ph: [phone]       Fax: [phone]   RxID:   [id number]

## 2010-02-08 NOTE — Progress Notes (Signed)
Summary: Needs 02 set up for travel to [facility]  Phone Note Call from Patient   Caller: Patient Call For: [name] Summary of Call: need prescript for oxygen to have filled as needed mailed  to home Initial call taken by: Rickard Patience,  June 17, 2009 1:59 PM  Follow-up for Phone Call        pt states he is going to be traveling ti [address] and [place] and states he needs rx to take with him for his o2--he states mw gave him one back in 2006 but needs a new one--the rx should state 2lpm with exertion and 2lpm while sleeping so he can give to a home care company there if need be Follow-up by: Philipp Deputy CMA,  June 17, 2009 2:36 PM  Additional Follow-up for Phone Call Additional follow up Details #1::        last notes indicate we justified his need for 02 with ex and at hs but this was not recent. we can give the info we have but what we normally do is work out travel o2 with his company here, not there. let libby see if she can sort out what he needs Additional Follow-up by: Nyoka Cowden MD,  June 17, 2009 4:42 PM    Additional Follow-up for Phone Call Additional follow up Details #2::    dr [name] i need you to write a script just like a presription for this pts 02 he is with [facility] and they are not a national company so pt will need to take an rx with him for the dme he will have to use out there Follow-up by: Oneita Jolly,  June 17, 2009 4:53 PM  Additional Follow-up for Phone Call Additional follow up Details #3:: Details for Additional Follow-up Action Taken: Fill it out and I'll sign it, thanks Additional Follow-up by: Nyoka Cowden MD,  June 18, 2009 8:59 AM

## 2010-02-08 NOTE — Assessment & Plan Note (Signed)
Summary: Pulmonary/ copd   Visit Type:  extended summary fu PCP:  [name]  Chief Complaint:  Pt returns for followup.  He c/o increased sob x several wks.  He states he only gets sob on exertion.[name]  History of Present Illness:  77 year old white male remote smoker with a diagnosis of severe COPD with an FEV1 of only 36% predicted documented April 2007 with a 29% improvement after bronchodilators consistent with an asthmatic component.   Baseline activity level--walks 1 mile daily with oxygen on. Maintained on Symbicort 160/4.64mcg 1 puff two times a day.   05/29/06 ov with  desaturation after two less on room air and recommended oxygen with activity  August 29, 2007 --returns 6 month follow up Since last visit doing well without significant change in activity tolerance. Gets stuffy nose at night with O2.  October 01, 2007  doe x 300 ft slt incline without 02 walking shed has to stop, some worse x 6 mo. Pt denies any significant sore throat, nasal congestion or excess secretions, fever, chills, sweats, unintended wt loss, pleuritic or exertional cp, orthopnea pnd or leg swelling.  Pt also denies any obvious fluctuation in symptoms with weather or environmental change or other alleviating or aggravating factors.         Serial Vital Signs/Assessments:  Comments: Ambulatory Pulse Oximetry  Resting; HR_74____    02 Sat_96____  Lap1 (185 feet)   HR_86____   02 Sat_94____ Lap2 (185 feet)   HR_105____   02 Sat_90____    Lap3 (185 feet)   HR_88____   02 Sat__88___  ___Test Completed without Difficulty ___Test Stopped due to: Clarise Cruz Duncan Dull)  October 01, 2007 12:11 PM   By: Clarise Cruz [facility])      Current Allergies (reviewed today): SULFA  Past Medical History:    CHRONIC RESPIRATORY FAILURE (ICD-518.83)    CHRONIC RHINITIS (ICD-472.0)    CHRONIC RESPIRATORY FAILURE (ICD-518.83)    COPD UNSPECIFIED (ICD-496)      - PFTs 05/04/05 FEV1 36% ratio 28% with 29%  improvement after bronchodilators and DLCO 48%    ISCHEMIC HEART DISEASE (ICD-414.9)    HYPERTENSION (ICD-401.9)    HYPERLIPIDEMIA (ICD-272.4)        Family History:    Reviewed history from 01/29/2007 and no changes required:       allergies and brother son heart disease in mother father two brothers  Social History:    Reviewed history from 01/29/2007 and no changes required:        Quit smoking 1983    Review of Systems  The patient denies anorexia, fever, weight loss, weight gain, vision loss, decreased hearing, hoarseness, chest pain, syncope, peripheral edema, prolonged cough, headaches, hemoptysis, abdominal pain, melena, hematochezia, severe indigestion/heartburn, hematuria, incontinence, muscle weakness, suspicious skin lesions, transient blindness, difficulty walking, depression, unusual weight change, abnormal bleeding, and enlarged lymph nodes.     Vital Signs:  Patient Profile:   77 Years Old Male Weight:      159.50 pounds O2 Sat:      94 % O2 treatment:    Room Air Temp:     97.9 degrees F oral Pulse rate:   81 / minute BP sitting:   130 / 68  (left arm)  Vitals Entered By: [name] (October 01, 2007 11:23 AM)                 Physical Exam  relatively robust but elderly white male in no acute distress wt 159 ->  159  October 01, 2007  HEENT mild turbinate edema.  Oropharynx no thrush or excess pnd or cobblestoning.  No JVD or cervical adenopathy. Mild accessory muscle hypertrophy. Trachea midline, nl thryroid. Chest was hyperinflated by percussion with diminished breath sounds and marked increased exp time without wheeze. Hoover sign positive onset of inspiration. Regular rate and rhythm without murmur gallop or rub or increase P2. Decrease s1s2 Abd: no hsm, nl excursion. Ext warm without C,C or E.       Impression & Recommendations:  Problem # 1:  COPD UNSPECIFIED (ICD-496) I spent extra time with the patient today explaining optimal mdi   technique.  This improved from  90 -100%  I had an extended discussion with the patient today lasting 15 to 20 minutes of a 25 minute visit on the following issues:   previous PFTs indicate gold stage III COPD with a low enough diffusing capacity to recommend chronic oxygen therapy indefinitely.  Recent outside CT scan dated August 2009 showing no evidence of thromboembolism.  I think he would benefit therefore from a trial of cerebra and also because he shows significant reversibility I showed him how to use Xopenex immediately before activity. Echo for rv dysfunction also should be done at this point if for no other reason but to reinforce appropriate use of 02 and sleep study on 02 to ro osa   Each maintenance medication was reviewed in detail including most importantly the difference between maintenance and as needed and under what circumstances the prns are to be used.  His updated medication list for this problem includes:    Symbicort 160-4.5 Mcg/act Aero (Budesonide-formoterol fumarate) .[name]... 1-2 puffs every 12 hours    Spiriva Handihaler 18 Mcg Caps (Tiotropium bromide monohydrate) .[name]..[name] Two puffs in handihaler daily    Xopenex Hfa 45 Mcg/act Aero (Levalbuterol tartrate) .[name]... 2 every 4 hours if needed  Orders: Est. Patient Level IV (45409)   Medications Added to Medication List This Visit: 1)  Spiriva Handihaler 18 Mcg Caps (Tiotropium bromide monohydrate) .... Two puffs in handihaler daily 2)  Xopenex Hfa 45 Mcg/act Aero (Levalbuterol tartrate) .... 2 every 4 hours if needed  Complete Medication List: 1)  Toprol Xl 25 Mg Tb24 (Metoprolol succinate) .... Once daily 2)  Lipitor 20 Mg Tabs (Atorvastatin calcium) .... Once daily 3)  Adult Aspirin Low Strength 81 Mg Tbdp (Aspirin) .... Once daily 4)  Atacand 8 Mg Tabs (Candesartan cilexetil) .... Once daily 5)  Multivitamins Tabs (Multiple vitamin) .... Once daily 6)  Oxygen 2 Liters Qhs and Prn  7)  Symbicort 160-4.5 Mcg/act Aero  (Budesonide-formoterol fumarate) .[name].. 1-2 puffs every 12 hours 8)  Spiriva Handihaler 18 Mcg Caps (Tiotropium bromide monohydrate) .... Two puffs in handihaler daily 9)  Xopenex Hfa 45 Mcg/act Aero (Levalbuterol tartrate) .... 2 every 4 hours if needed   Patient Instructions: 1)  use xopenex 2 puffs immediately before activity 2)  Please schedule a follow-up appointment in 6 weeks for PFT's  Flu Vaccine Consent Questions     Do you have a history of severe allergic reactions to this vaccine? no    Any prior history of allergic reactions to egg and/or gelatin? no    Do you have a sensitivity to the preservative Thimersol? no    Do you have a past history of Guillan-Barre Syndrome? no    Do you currently have an acute febrile illness? no    Have you ever had a severe reaction to latex? no  Vaccine information given and explained to patient? yes    Are you currently pregnant? no    Lot Number:[id number]   Site Given  Left Deltoid IM [name]  October 01, 2007 12:17 PM         Prescriptions: SPIRIVA HANDIHALER 18 MCG  CAPS (TIOTROPIUM BROMIDE MONOHYDRATE) Two puffs in handihaler daily  #34 x 11   Entered and Authorized by:   Nyoka Cowden MD   Signed by:   Nyoka Cowden MD on 10/01/2007   Method used:   Print then Give to Patient   RxID:   [id number] JYNWGNF HFA 45 MCG/ACT AERO (LEVALBUTEROL TARTRATE) 2 every 4 hours if needed  #1 x 11   Entered and Authorized by:   Nyoka Cowden MD   Signed by:   Nyoka Cowden MD on 10/01/2007   Method used:   Print then Give to Patient   RxID:   [id number] SPIRIVA HANDIHALER 18 MCG  CAPS (TIOTROPIUM BROMIDE MONOHYDRATE) Two puffs in handihaler daily  #1 x 11   Entered and Authorized by:   Nyoka Cowden MD   Signed by:   Nyoka Cowden MD on 10/01/2007   Method used:   Electronically to        [facility]. [phone]* (retail)       [address]       [place], [place]  [address]        Ph: [phone]       Fax: [phone]   RxID:   [id number]  ]

## 2010-02-08 NOTE — Assessment & Plan Note (Signed)
Summary: cough,cong,hoarse/apc  Medications Added SYMBICORT 160-4.5 MCG/ACT  AERO (BUDESONIDE-FORMOTEROL FUMARATE) 1-2 puffs every 12 hours ASTELIN 137 MCG/SPRAY  SOLN (AZELASTINE HCL) 2 puffs at bedtime then brush teeth  and every 6 hours if needed        Visit Type:  Follow-up extended PCP:  [name]  Chief Complaint:  follow-up.  History of Present Illness: [id number]    77 year old white male remote smoker with a diagnosis of severe COPD with an FEV1 of only 36% predicted documented April 2007 with a 29% improvement after bronchodilators consistent with an asthmatic component.  He returns today stating that he is discouraged that he still requires oxygen when he tries to do his treadmill.  His habit is to work for about the first 15 minutes off of oxygen until his 02 sat level drops then start oxygen and continue at the same pacing grade.  He has not noticed much difference between his good days and bad days nor any nocturnal symptoms.  He is also bothered by difficulty with dyspnea whenever uses his arms for instance playing with his dogs.  He denies any exertional chest pain orthopnea PND or significant cough sinus or reflux symptoms    Current Allergies: SULFA   Family History:    allergies and brother son heart disease in mother father two brothers  Social History:     Quit smoking 1983   Risk Factors:  Tobacco use:  quit    Vital Signs:  Patient Profile:   77 Years Old Male Weight:      166 pounds O2 Sat:      95 % O2 treatment:    Room Air Temp:     98.0 degrees F oral Pulse rate:   60 / minute BP sitting:   114 / 72  (left arm)  Vitals Entered By: Michel Bickers CMA (January 29, 2007 10:13 AM)             Comments Pt states he is still SOB w/ exertion. Medications reviewed.        Problem # 1:  COPD UNSPECIFIED (ICD-496) I had an extended discussion with the patient today lasting 15 to 20 minutes of a 25 minute visit on the following issues:    he actually has COPD with asthmatic component but did not tolerate Advair well and I had been reluctant to push Symbicort to maximum levels because of previous intolerance to Advair consisting of palpitations.  However, bleeding would benefit in terms of activity tolerance if you at times Symbicort to be used 15 minutes before his main activity today.  I would also like to see him push harder to achieve aerobic capacity by wearing oxygen throughout the entire treadmill exercise and exercise always a level where he is a bit short of breath but never out of breath for 30 minutes daily.  This should help his conditioning overall and perhaps improve his ability to play with his dogs using his arms, which I believe makes him short of breath because of the use of accessory muscles in the neck and shoulders.   I spent extra time with the patient today explaining optimal mdi  technique.  This improved from  75 -90% His updated medication list for this problem includes:    Symbicort 160-4.5 Mcg/act Aero (Budesonide-formoterol fumarate) .Marland Kitchen... 1-2 puffs every 12 hours   Problem # 2:  CHRONIC RHINITIS (ICD-472.0) Previously felt to be ace vs gerd, now more likely to be nonspecific rhinitis some better with clarinex  or nasonex but still aggravating him ' Try Symbicort out through nose and astelin at bedtime and as needed  plus humidify 02   Try Astelin  Problem # 3:  CHRONIC RESPIRATORY FAILURE (ICD-518.83) Needs to wear 02 at bedtime and with any planned exertion. Otherwise not getting as much aerobic activity as desired.  Medications Added to Medication List This Visit: 1)  Symbicort 160-4.5 Mcg/act Aero (Budesonide-formoterol fumarate) .Marland Kitchen.. 1-2 puffs every 12 hours 2)  Astelin 137 Mcg/spray Soln (Azelastine hcl) .... 2 puffs at bedtime then brush teeth  and every 6 hours if needed   Patient Instructions: 1)  use astelin 2 puffs at bedtime and evey 6 hours as needed 2)  Try to time 2 puffs of  Symbicort w/in 2 hours of planned activity and breath out through nose    Prescriptions: ASTELIN 137 MCG/SPRAY  SOLN (AZELASTINE HCL) 2 puffs at bedtime then brush teeth  and every 6 hours if needed  #1 x 11   Entered and Authorized by:   Nyoka Cowden MD   Signed by:   Nyoka Cowden MD on 01/29/2007   Method used:   Electronically sent to ...       [facility] [address] [phone]*       [address]       [place], [place]  [address]       Ph: [phone]       Fax: [phone]   RxID:   [id number]  ]

## 2010-02-08 NOTE — Letter (Signed)
Summary: letter from son about condition/[facility]  letter from son about condition/[facility]   Imported By: Lester Holly Pond 10/21/2007 09:12:30  _____________________________________________________________________  External Attachment:    Type:   Image     Comment:   External Document

## 2010-02-08 NOTE — Assessment & Plan Note (Signed)
Summary: acute sick visit for acute bronchitis   Primary Provider/Referring Provider:  [name]  CC:  Pt is here for an acute sick visit.  Pt is a pt of Dr. Thurston Hole.  Pt c/o  "cold" x 8 days.  Pt c/o chest congestion but difficulty coughing up sputum.  Pt also c/o increased sob and mild chest discomfort in the center of chest.  Pt denied fever.  Marland Kitchen  History of Present Illness: the patient is a 77 year old male with severe emphysema and superimposed asthmatic component, who comes in today for an acute sick visit. He is normally followed by Dr. Sherene Sires on a consistent basis. He gives a one-week history of increasing chest congestion with rattling and increased cough. He is not able to bring up mucus currently, but feels that he is developing an acute respiratory infection. He has noticed increasing shortness of breath with some wheezing. He denies any fevers, chills, or sweats.  Medications Prior to Update: 1)  Toprol Xl 25 Mg  Tb24 (Metoprolol Succinate) .... Once Daily 2)  Lipitor 20 Mg  Tabs (Atorvastatin Calcium) .... Once Daily 3)  Ascriptin 325 Mg Tabs (Aspirin Buf(Alhyd-Mghyd-Cacar)) .... Once Daily 4)  Atacand 8 Mg  Tabs (Candesartan Cilexetil) .... Once Daily 5)  Multivitamins   Tabs (Multiple Vitamin) .... Once Daily 6)  Oxygen 2 Liters Qhs and Prn 7)  Symbicort 160-4.5 Mcg/act  Aero (Budesonide-Formoterol Fumarate) .Marland Kitchen.. 1-2 Puffs Every 12 Hours 8)  Xopenex Hfa 45 Mcg/act Aero (Levalbuterol Tartrate) .... 2 Every 4 Hours If Needed  Allergies (verified): 1)  ! * Omnicef 2)  Sulfa  Review of Systems       The patient complains of shortness of breath with activity, non-productive cough, and chest pain.  The patient denies shortness of breath at rest, productive cough, coughing up blood, irregular heartbeats, acid heartburn, indigestion, loss of appetite, weight change, abdominal pain, difficulty swallowing, sore throat, tooth/dental problems, headaches, nasal congestion/difficulty breathing  through nose, sneezing, itching, ear ache, anxiety, depression, hand/feet swelling, joint stiffness or pain, rash, change in color of mucus, and fever.    Vital Signs:  Patient profile:   77 year old male Weight:      160.25 pounds O2 Sat:      95 % on Room air Temp:     97.5 degrees F oral Pulse rate:   85 / minute BP sitting:   132 / 70  (right arm) Cuff size:   regular  Vitals Entered By: Arman Filter LPN (Jun 05, 2008 10:54 AM)  O2 Flow:  Room air CC: Pt is here for an acute sick visit.  Pt is a pt of Dr. Thurston Hole.  Pt c/o  "cold" x 8 days.  Pt c/o chest congestion but difficulty coughing up sputum.  Pt also c/o increased sob and mild chest discomfort in the center of chest.  Pt denied fever.   Comments .Medications reviewed with patient Arman Filter LPN  Jun 05, 2008 10:58 AM    Physical Exam  General:  thin male in nad Nose:  no purulence in nares Mouth:  clear  Lungs:  very decreased bs throughout, no wheezing Heart:  rrr, 2/6 sem Extremities:  no LE edema Neurologic:  alert and oriented, moves all 4   Impression & Recommendations:  Problem # 1:  BRONCHITIS, ACUTE (ICD-466.0) the patient is developing increased chest congestion and cough, and feels this is leading to an acute pulmonary infection. I suspect he is developing acute bronchitis, and that we  need to treat with a short course of an antibiotic given his severe disease and poor pulmonary reserve.  Problem # 2:  COPD UNSPECIFIED (ICD-496) the pt has severe emphysema with asthmatic component. He is not wheezing currently, but does have worsening shortness of breath. He is planning on leaving town, and therefore I will give him a prescription for prednisone to hold in the event that he develops worsening shortness of breath and bronchospasm.  Medications Added to Medication List This Visit: 1)  Prednisone 10 Mg Tabs (Prednisone) .... Take 4 each day for 2 days, then 2 each day for 2 days, then one each day for 2  days, then stop 2)  Omnicef 300 Mg Caps (Cefdinir) .... Two tablets (600 mg) by mouth daily 3)  Avelox 400 Mg Tabs (Moxifloxacin hcl) .... By mouth daily  Other Orders: Est. Patient Level IV (42595)  Patient Instructions: 1)  will treat with avelox 400mg  one each day for 5 days. 2)  will give you a prescription for prednisone to hold.  Fill and take as directed if having persistent or worsening breathing issues. 3)  mucinex dm 2 tabs in am and pm with large glass of water until better.  This will help thin mucus 4)  follow up with Dr. Sherene Sires if not improving.  Prescriptions: AVELOX 400 MG  TABS (MOXIFLOXACIN HCL) By mouth daily  #5 x 0   Entered and Authorized by:   Barbaraann Share MD   Signed by:   Barbaraann Share MD on 06/05/2008   Method used:   Print then Give to Patient   RxID:   [id number] OMNICEF 300 MG  CAPS (CEFDINIR) Two tablets (600 mg) by mouth daily  #10 x 0   Entered and Authorized by:   Barbaraann Share MD   Signed by:   Barbaraann Share MD on 06/05/2008   Method used:   Print then Give to Patient   RxID:   [id number] PREDNISONE 10 MG  TABS (PREDNISONE) take 4 each day for 2 days, then 2 each day for 2 days, then one each day for 2 days, then stop  #qs x 0   Entered and Authorized by:   Barbaraann Share MD   Signed by:   Barbaraann Share MD on 06/05/2008   Method used:   Print then Give to Patient   RxID:   [id number]

## 2010-02-08 NOTE — Assessment & Plan Note (Signed)
Summary: follow up/ [facility]   PCP:  [name]  Chief Complaint:  6 month follow up - req symbicort refill.  History of Present Illness:  77 year old white male remote smoker with a diagnosis of severe COPD with an FEV1 of only 36% predicted documented April 2007 with a 29% improvement after bronchodilators consistent with an asthmatic component.   Baseline activity level--walks 1 mile daily with oxygen on. Maintained on Symbicort 160/4.73mcg 1 puff two times a day.   August 29, 2007 --returns 6 month follow up Since last visit doing well without significant change in activity tolerance. Gets stuffy nose at night with O2. Denies chest pain,  orthopnea, hemoptysis, fever, n/v/d, edema, wt loss. 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      Updated Prior Medication List: TOPROL XL 25 MG  TB24 (METOPROLOL SUCCINATE) once daily LIPITOR 20 MG  TABS (ATORVASTATIN CALCIUM) once daily ADULT ASPIRIN LOW STRENGTH 81 MG  TBDP (ASPIRIN) once daily ATACAND 8 MG  TABS (CANDESARTAN CILEXETIL) once daily MULTIVITAMINS   TABS (MULTIPLE VITAMIN) once daily * OXYGEN 2 LITERS QHS AND PRN  SYMBICORT 160-4.5 MCG/ACT  AERO (BUDESONIDE-FORMOTEROL FUMARATE) 1-2 puffs every 12 hours  Current Allergies (reviewed today): SULFA  Past Medical History:    Reviewed history from 01/28/2007 and no changes required:       CHRONIC RESPIRATORY FAILURE (ICD-518.83)       CHRONIC RHINITIS (ICD-472.0)       CHRONIC RESPIRATORY FAILURE (ICD-518.83)       COPD UNSPECIFIED (ICD-496)       ISCHEMIC HEART DISEASE (ICD-414.9)       HYPERTENSION (ICD-401.9)       HYPERLIPIDEMIA (ICD-272.4)           Family History:    Reviewed history from 01/29/2007 and no changes required:       allergies and brother son heart disease in mother father two brothers  Social History:    Reviewed history from 01/29/2007 and no changes required:        Quit smoking 1983   Risk Factors: Tobacco use:  quit    Year quit:   1983    Pack-years:  2yrs, 1.5ppd   Review of Systems      See HPI   Vital Signs:  Patient Profile:   77 Years Old Male Weight:      160.50 pounds O2 Sat:      95 % O2 treatment:    Room Air Temp:     97.1 degrees F oral Pulse rate:   64 / minute BP sitting:   130 / 74  (left arm) Cuff size:   regular  Vitals Entered By: Boone Master CNA (August 29, 2007 3:08 PM)             Is Patient Diabetic? No Comments Medications reviewed with patient Boone Master CNA  August 29, 2007 3:08 PM      Physical Exam  GENERAL:  A/Ox3; pleasant & cooperative.NAD HEENT:  [place]/AT, EOM-wnl, PERRLA, EACs-clear, TMs-wnl, NOSE-clear, THROAT-clear & wnl. NECK:  Supple w/ fair ROM; no JVD; normal carotid impulses w/o bruits; no thyromegaly or nodules palpated; no lymphadenopathy. CHEST:  Decreased BS in bases HEART:  RRR, no m/r/g  heard ABDOMEN:  Soft & nt; nml bowel sounds; no organomegaly or masses detected. EXT: Warm bilat,  no calf pain, edema, clubbing, pulses intact Skin: no rash/lesion       Impression & Recommendations:  Problem # 1:  COPD UNSPECIFIED (ICD-496)  Severe COPD with chronic nocturnal desaturation and exertional desat.  Continue on Symbicort 1 puff two times a day (may need to increase to 2 puffs if flare of sx) O2 at bedtime and with actiivity.  follow up 6mon.   Orders: Est. Patient Level III (09811)  His updated medication list for this problem includes:    Symbicort 160-4.5 Mcg/act Aero (Budesonide-formoterol fumarate) .Marland Kitchen... 1-2 puffs every 12 hours   Complete Medication List: 1)  Toprol Xl 25 Mg Tb24 (Metoprolol succinate) .... Once daily 2)  Lipitor 20 Mg Tabs (Atorvastatin calcium) .... Once daily 3)  Adult Aspirin Low Strength 81 Mg Tbdp (Aspirin) .... Once daily 4)  Atacand 8 Mg Tabs (Candesartan cilexetil) .... Once daily 5)  Multivitamins Tabs (Multiple vitamin) .... Once daily 6)  Oxygen 2 Liters Qhs and Prn  7)  Symbicort 160-4.5 Mcg/act Aero  (Budesonide-formoterol fumarate) .Marland Kitchen.. 1-2 puffs every 12 hours   Patient Instructions: 1)  Use saline nasal spray 2 puffs / nostrils as needed  2)  Wear oxygen at bedtime, and with activity 3)  Please contact office for sooner follow up if symptoms do not improve or worsen  4)  follow up 6 months.    ]

## 2010-02-08 NOTE — Assessment & Plan Note (Signed)
Summary: Pulmonary/ aecopd with dpi teaching 90% effective   Primary Provider/Referring Provider:  [name]  CC:  Acute visit.  Pt c/o wheezing and cough x 5 days. Cough is prod with frothy white sputum.  He also c/o increased SOB.  He has been using o2 all the time since symptoms started. [name]  History of Present Illness: 77 yowm last smoked 1983  severe COPD  FEV1 of only 36% April 2007 with a 29% improvement after bronchodilators consistent with an asthmatic component.   Baseline activity level--walk 1 mile daily with oxygen on. Maintained on Symbicort 160/4.47mcg 1 puff two times a day.    February 11, 2008 ov never took spiriva consistently.  No variability to doe or sign cough. rec spiriva but w/in one week cp > MCH with neg cardiac wu, still having occ cp , positional, better with rubbing chest and relaxing.  March 26, 2008 doe x 30 min mile on 02, in retrospect better on spiriva.no cough. no itching sneezing or wheezing. Try spiriva every other for several weeks then take daily, think of it like high octane fuel for your car - stop spiriva if you don't feel it's helping you ex performance or if you have any side effects Please schedule a follow-up appointment in 3 months > never took   June 30, 2008 ov no longer walking on 02, does walk to shed and back without 02 and without stopping but confused with instructions on how to take rescue rx, having variable doe minimal cough no noct c/o's.  rec trial of spiriva.  October 15, 2008 rov follow up, about the same  did not get along well with spiriva , discont. taking caused dry mouth. still definitely benefits from as needed xopenex.    November 19, 2008--Presents for follow up. was switched from metoprolol to bystolic at last OV.  states was feeling tired, "beat up" and lethargic so began the metoprolol again  but did not feel better.  would like to know if he may switch back to the bystolic , and also the spiriva. to help with breathing. rec  resume spiriva > stopped due to urinary retention while on a trip to [facility]  May 03, 2009    c/o breathing worsening over the past 2-3 wk, gets SOB with any activity.  He also c/o leg swelling getting worse- lasix is not helping and he c/o low BP when uses lasix. no cough.. better on 02, p saba  page 2       August 19, 2009 Acute visit.  Pt c/o wheezing and cough x 5 days. Cough is prod with frothy white sputum.  He also c/o increased SOB.  He has been using o2 all the time since symptoms started.   comfortable at rest on room air and ok lying down on 2 lpm but otherwise sob.  no cp. no purulent sputum.  mod nasal congestion ? related to 02.  Pt denies any significant sore throat, dysphagia, itching, sneezing,   fever, chills, sweats, unintended wt loss, pleuritic or exertional cp, hempoptysis, change in activity tolerance  orthopnea pnd or leg swelling   Current Medications (verified): 1)  Lipitor 20 Mg  Tabs (Atorvastatin Calcium) .... Once Daily 2)  Ascriptin 325 Mg Tabs (Aspirin Buf(Alhyd-Mghyd-Cacar)) .... Once Daily 3)  Atacand 8 Mg  Tabs (Candesartan Cilexetil) .... Once Daily 4)  Multivitamins   Tabs (Multiple Vitamin) .... Once Daily 5)  Oxygen 2 Liters At Bedtime and With Exertion .... Wear  Automatically At Bedtime and With Any Activity Other Than Walking Room To Room 6)  Toprol Xl 25 Mg Xr24h-Tab (Metoprolol Succinate) .[name].. 1 Once Daily 7)  Symbicort 160-4.5 Mcg/act  Aero (Budesonide-Formoterol Fumarate) .... 2 Puffs First Thing  in Am and 2 Puffs Again in Pm About 12 Hours Later 8)  Xopenex Hfa 45 Mcg/act Aero (Levalbuterol Tartrate) .... 2 Every 4 Hours If Needed 9)  Furosemide 20 Mg Tabs (Furosemide) .[name].. 1 By Mouth Once Daily As Needed Leg Swelling 10)  Nasonex 50 Mcg/act  Susp (Mometasone Furoate) .... Two Puffs Each Nostril Daily As Needed  Allergies (verified): 1)  ! Sulfa 2)  ! Truman Hayward  Past History:  Past Medical History: CHRONIC RHINITIS (ICD-472.0) CHRONIC  RESPIRATORY FAILURE (ICD-518.83)      - 02 rx since 2008 COPD UNSPECIFIED (ICD-496)....................................................................Travis KitchenWert   - PFTs 05/04/05 FEV1 36% ratio 28% with 29% improvement after bronchodilators    DLCO 48%   - PFT's 03/26/08          30% ratio 34      with 14% improvement after bronchodilators  DLC0 38 %   - Start noct 02 at 2lpm 2008 and on 03/04/09 desat @ > 185 ft so rec wear with activtiy > rm to rm   - [employer] June 30, 2008 100% > confirmed March 04, 2009  ISCHEMIC HEART DISEASE (ICD-414.9) HYPERTENSION (ICD-401.9) HYPERLIPIDEMIA (ICD-272.4) SPN RUL by cxr August 19, 2009     Vital Signs:  Patient profile:   77 year old male Weight:      158.56 pounds BMI:     23.50 O2 Sat:      99 % on 2 L/min Temp:     97.9 degrees F Pulse rate:   80 / minute BP sitting:   146 / 82  (left arm)  Vitals Entered By: [name] (August 19, 2009 10:06 AM)  O2 Flow:  2 L/min O2 Sat on room air at rest %:  95  Physical Exam  Additional Exam:  chronically ill but ambulatory elderly white male in no acute distress wt  159 October 01, 2007  > 159 March 04, 2009>  159 May 03, 2009 > 159 May 03, 2009 > 158 August 19, 2009  HEENT mild turbinate edema.  Oropharynx no thrush or excess pnd or cobblestoning.  No JVD or cervical adenopathy. Mild accessory muscle hypertrophy. Trachea midline, nl thryroid. Chest was hyperinflated by percussion with diminished breath sounds and marked increased exp time without wheeze. Hoover sign positive onset of inspiration. Regular rate and rhythm without murmur gallop or rub or increase P2. Decrease s1s2 Abd: no hsm, nl excursion. Ext warm without cyanosis or clubbing,  trace bilateral  edema    Impression & Recommendations:  Problem # 1:  COPD UNSPECIFIED (ICD-496) GOLD IV  nearly endstage if not there already with very poor cough mechanics.    I spent extra time with the patient today explaining optimal  DIP    technique.  This improved from  75-95% and will rechallenge with spiriva at this point and look for obst symtoms  See instructions for specific recommendations   Problem # 2:  CHRONIC RHINITIS (ICD-472.0) See instructions for specific recommendations for use of Afrin acutely  Problem # 3:  PULMONARY NODULE (ICD-518.89) RUL apically, barely viz and very unlikely to add to what is already a very morbid condition so follow conservatively for now - placed in tickle file for recall by 3 months  Other Orders:  Est. Patient Level IV (16109) HFA Instruction [phone]) T-2 View CXR ([id number])  Patient Instructions: 1)  I emphasized that nasal steroids have no immediate benefit in terms of improving symptoms.  To help them reached the target tissue, the patient should use Afrin two puffs every 12 hours applied one min before using the nasal steroids.  Afrin should be stopped after no more than 5 days.  If the symptoms worsen, Afrin can be restarted after 5 days off of therapy to prevent rebound congestion from overuse of Afrin.  I also emphasized that in no way are nasal steroids a concern in terms of "addiction". 2)  Prednisone 10 mg 4 each am x 3 days,  3 x 3 days, 2x3 days, and 1x3 days 3)  Restart Spiriva  4)  Start Flutter valve 5)  See Tammy NP w/in 2 weeks with all your medications, even over the counter meds, separated in two separate bags, the ones you take no matter what vs the ones you stop once you feel better and take only as needed.  She will generate for you a new user friendly medication calendar that will put [place] all on the same page re: your medication use.  6)     Appended Document: Pulmonary/ aecopd with dpi teaching 90% effective see attached cxr report

## 2010-02-08 NOTE — Progress Notes (Signed)
Summary: medication  Phone Note Call from Patient   Caller: Patient Call For: wert Summary of Call: pt need to talk to nurse about changing medication.  Initial call taken by: Rickard Patience,  November 18, 2008 1:38 PM  Follow-up for Phone Call        Spoke with pt.  He states breathing no better. He states that bystolic made him very tired and "groggy", so he switched back to toprol and the problem still remains.  OV sched with TP for tommorrow at 4 pm. Follow-up by: [name],  November 18, 2008 2:54 PM

## 2010-02-08 NOTE — Progress Notes (Signed)
Summary: rehab referr > done  Phone Note Call from [name] Call back at Home Phone [phone]   Caller: [name] Call For: tammy Summary of Call: pt forgot to tell you something from yesterdays appt he had discussed pulm rehab at last visit he needs a new referrall to rehab Initial call taken by: Lacinda Axon,  November 23, 2009 1:40 PM  Follow-up for Phone Call        Dr Sherene Sires, is it okay to send order to [facility] for pulmonary rehab. Thanks! [name]  November 23, 2009 2:18 PM Done  Follow-up by: Nyoka Cowden MD,  November 23, 2009 2:57 PM  Additional Follow-up for Phone Call Additional follow up Details #1::        called spoke with [name], informed him that the order has been placed and that he will be contacted by rehab to set up a start date.  pt verbalized his understanding. Additional Follow-up by: Boone Master CNA/MA,  November 23, 2009 3:02 PM

## 2010-02-08 NOTE — Progress Notes (Signed)
Summary: Pulmonary Nodule reported  Phone Note From Other Clinic Call back at [phone]   Caller: Dr. Kyung Rudd - Radiology - [facility]. Call For: Wert Request: Talk with Nurse Summary of Call: Dr. Kyung Rudd needs to speak to [facility] re: this pt's cxr.  Said there was a new nodule on cxr - want to make someone aware.  No one answered and Dr. Kyung Rudd just asked that nurse call her back. Initial call taken by: Eugene Gavia,  August 19, 2009 11:26 AM  Follow-up for Phone Call        Spoke with Dr Kyung Rudd, she states that there is a new nodular density, f/u ct chest rec.  Will forward to Dr Sherene Sires. Follow-up by: [name],  August 19, 2009 11:35 AM  Additional Follow-up for Phone Call Additional follow up Details #1::        aware, placed in tickle file Additional Follow-up by: Nyoka Cowden MD,  August 19, 2009 12:07 PM

## 2010-02-08 NOTE — Assessment & Plan Note (Signed)
Summary: Pulmonary/  ext ov 100% HFA,  desat walking > 1 lap   Primary Provider/Referring Provider:  [name]  CC:  6 wk followup.  Pt states that his breathing is the same.  He states that he is unusually tired for the past 2 months.  .  History of Present Illness: 43 yowm last smoked 1983  severe COPD  FEV1 of only 36% April 2007 with a 29% improvement after bronchodilators consistent with an asthmatic component.   Baseline activity level--walk 1 mile daily with oxygen on. Maintained on Symbicort 160/4.58mcg 1 puff two times a day.    February 11, 2008 ov never took spiriva consistently.  No variability to doe or sign cough. rec spiriva but w/in one week cp > MCH with neg cardiac wu, still having occ cp , positional, better with rubbing chest and relaxing.  March 26, 2008 doe x 30 min mile on 02, in retrospect better on spiriva.no cough. no itching sneezing or wheezing. Try spiriva every other for several weeks then take daily, think of it like high octane fuel for your car - stop spiriva if you don't feel it's helping you ex performance or if you have any side effects Please schedule a follow-up appointment in 3 months > never took   June 30, 2008 ov no longer walking on 02, does walk to shed and back without 02 and without stopping but confused with instructions on how to take rescue rx, having variable doe minimal cough no noct c/o's.  rec trial of spiriva.  October 15, 2008 rov follow up, about the same  did not get along well with spiriva , discont. taking caused dry mouth. still definitely benefits from as needed xopenex.    November 19, 2008--Presents for follow up. was switched from metoprolol to bystolic at last OV.  states was feeling tired, "beat up" and lethargic so began the metoprolol again  but did not feel better.  would like to know if he may switch back to the bystolic , and also the spiriva. to help with breathing.     December 22, 2008-Presents for an acute office visit.  Complains of c/o cough-clearx 1 wk increased sob, no wheezing or fcs,nasal congestion-clear. He requests to change back to metoprol , does not like bystolic b/c b/p up and down. Retaining fluids, feels more DOE.   rec doxy and mucinex dm  January 21, 2009 1 month followup.  ok on spiriva. Pt states that cough has resolved.  He still increased SOB with exertion which is reversed by xopenex but only for an hour or so and is afraid to take more due to palpitations. concerned about leg swelling but concerned furosemide might be too strong so doesn't take it often.   rec increase xopenex before activity and use prednisone x 6 days > better   March 04, 2009 ov cc  breathing is the same.  He states that he is unusually tired for the past 2 months.  doe x more than slow walk at [employer]. minimal use of xopenex. no nocturnal excac or early am c/o's.  Pt denies any significant sore throat, dysphagia, itching, sneezing,  nasal congestion or excess secretions,  fever, chills, sweats, unintended wt loss, pleuritic or exertional cp, hempoptysis, orthopnea pnd or leg swelling. Pt also denies any obvious fluctuation in symptoms with weather or environmental change or other alleviating or aggravating factors.        Current Medications (verified): 1)  Lipitor 20 Mg  Tabs (Atorvastatin Calcium) .... Once Daily 2)  Ascriptin 325 Mg Tabs (Aspirin Buf(Alhyd-Mghyd-Cacar)) .... Once Daily 3)  Atacand 8 Mg  Tabs (Candesartan Cilexetil) .... Once Daily 4)  Multivitamins   Tabs (Multiple Vitamin) .... Once Daily 5)  Oxygen 2 Liters At Bedtime and With Exertion 6)  Symbicort 160-4.5 Mcg/act  Aero (Budesonide-Formoterol Fumarate) .... 2 Puffs First Thing  in Am and 2 Puffs Again in Pm About 12 Hours Later 7)  Xopenex Hfa 45 Mcg/act Aero (Levalbuterol Tartrate) .... 2 Every 4 Hours If Needed 8)  Spiriva Handihaler 18 Mcg Caps (Tiotropium Bromide Monohydrate) .Marland Kitchen.. 1 Puff Once Daily 9)  Furosemide 20 Mg Tabs (Furosemide)  .Marland Kitchen.. 1 By Mouth Once Daily As Needed Leg Swelling 10)  Toprol Xl 25 Mg Xr24h-Tab (Metoprolol Succinate) .Marland Kitchen.. 1 Once Daily 11)  Vitamin D (Ergocalciferol) 50000 Unit Caps (Ergocalciferol) .Marland Kitchen.. 1 Per Wk As Directed  Allergies (verified): 1)  ! Sulfa 2)  ! Truman Hayward  Past History:  Past Medical History: CHRONIC RHINITIS (ICD-472.0) CHRONIC RESPIRATORY FAILURE (ZOX-096.04) COPD UNSPECIFIED (ICD-496).................................................................[name]   - PFTs 05/04/05 FEV1 36% ratio 28% with 29% improvement after bronchodilators    DLCO 48%   - PFT's 03/26/08          30% ratio 34      with 14% improvement after bronchodilators  DLC0 38 %   - Start noct 02 at 2lpm 2008   - [facility] June 30, 2008 100% > confirmed March 04, 2009    - Rechallenge with spiriva June 30, 2008 >  rechallenged Dec 2011 ISCHEMIC HEART DISEASE (ICD-414.9) HYPERTENSION (ICD-401.9) HYPERLIPIDEMIA (ICD-272.4)    Vital Signs:  Patient profile:   77 year old male Weight:      159.38 pounds O2 Sat:      95 % on Room air Temp:     97.4 degrees F oral Pulse rate:   81 / minute BP sitting:   118 / 62  (left arm)  Vitals Entered By: [name] (March 04, 2009 10:17 AM)  O2 Flow:  Room air  Serial Vital Signs/Assessments:  Comments: 11:00 AM Ambulatory Pulse Oximetry  Resting; HR_81____    02 Sat__94___  Lap1 (185 feet)   HR_108____   02 Sat_87____ Lap2 (185 feet)   HR_____   02 Sat_____    Lap3 (185 feet)   HR_____   02 Sat_____  ___Test Completed without Difficulty x__Test Stopped due to: sat. dropped with 1st lap, returned to room sat. returned to 90%,HR 102.  Elray Buba RN  March 04, 2009 11:03 AM  By: Elray Buba RN    Physical Exam  Additional Exam:  relatively robust  elderly white male in no acute distress wt  159 October 01, 2007  > wt 164 February 11, 2008 > 159 March 26, 2008 > 158  January 21, 2009 > 159 March 04, 2009  HEENT mild turbinate edema.   Oropharynx no thrush or excess pnd or cobblestoning.  No JVD or cervical adenopathy. Mild accessory muscle hypertrophy. Trachea midline, nl thryroid. Chest was hyperinflated by percussion with diminished breath sounds and marked increased exp time without wheeze. Hoover sign positive onset of inspiration. Regular rate and rhythm without murmur gallop or rub or increase P2. Decrease s1s2 Abd: no hsm, nl excursion. Ext warm without cyanosis or clubbing,  trace bilateral  edema    CXR  Procedure date:  12/22/2008  Findings:       Comparison: February 24, 2008   Findings: The  cardiac silhouette, mediastinum, pulmonary vasculature are within normal limits.  Both lungs are hyperexpanded, consistent with an element of COPD.  No focal infiltrates or effusions are identified.  The bones are osteopenic.   IMPRESSION: Stable chest x-ray with evidence of chronic obstructive diseas  Impression & Recommendations:  Problem # 1:  COPD UNSPECIFIED (ICD-496) GOLD IV COPD with worsening ex tol but def response in past to short courses of steroids sytemically  DDX of  difficult airways managment all start with A and  include Adherence, Ace Inhibitors, Acid Reflux, Active Sinus Disease, Alpha 1 Antitripsin deficiency, Anxiety masquerading as Airways dz,  ABPA,  allergy(esp in young), Aspiration (esp in elderly), Adverse effects of DPI,  Active smokers, plus one B  = Beta blocker use..    Adherence is an issue because he can't really take deep breaths on symbicort when he's air trapping and declines using xopenex more aggressively.  rec short course prednisone to see if we can achieve better "high water mark" then maintain it on symbicort which he's doing better with in terms of optimal HFA today  Acid reflux also in ddx, try ppi and diet x 6 week trial.    Each maintenance medication was reviewed in detail including most importantly the difference between maintenance and as needed and under what  circumstances the prns are to be used. See instructions for specific recommendations   Problem # 2:  CHRONIC RESPIRATORY FAILURE (ICD-518.83)  Orders: Pulse Oximetry, Ambulatory (16109)  Does desat with exercise so will consider placing on 02 next ov if not better and checking ex tol/02 sats on 02 2lpm  Medications Added to Medication List This Visit: 1)  Vitamin D (ergocalciferol) 50000 Unit Caps (Ergocalciferol) .Marland Kitchen.. 1 per wk as directed 2)  Prednisone 10 Mg Tabs (Prednisone) .... 4 each am x 2days, 2x2days, 1x2days and stop  Other Orders: Est. Patient Level IV (60454)  Clinical Reports Reviewed:  CXR:  02/24/2008: CXR Results:  copd only   Patient Instructions: 1)  Prednsione 10 mg 4 each am x 2days, 2x2days, 1x2days and stop  2)  GERD (REFLUX)  is a common cause of respiratory symptoms. It commonly presents without heartburn and can be treated with medication, but also with lifestyle changes including avoidance of late meals, excessive alcohol, smoking cessation, and avoid fatty foods, chocolate, peppermint, colas, red wine, and acidic juices such as orange juice. NO MINT OR MENTHOL PRODUCTS SO NO COUGH DROPS  3)  USE SUGARLESS CANDY INSTEAD (jolley ranchers)  4)  NO OIL BASED VITAMINS  5)  Prilosec 20 mg Take  one 30-60 min before first meal of the day until next office visit in 6 weeks 6)  for cough mucinex 1-2 every 12 hours  Prescriptions: PREDNISONE 10 MG  TABS (PREDNISONE) 4 each am x 2days, 2x2days, 1x2days and stop  #14 x 0   Entered and Authorized by:   Nyoka Cowden MD   Signed by:   Nyoka Cowden MD on 03/04/2009   Method used:   Electronically to        [facility]. [phone]* (retail)       [address]       [place], [place]  [address]       Ph: [phone]       Fax: [phone]   RxID:   [id number]

## 2010-02-08 NOTE — Letter (Signed)
Summary: [facility] oxygen/Adv Svc  [facility] oxygen/Adv Svc   Imported By: Lester [place] 11/21/2007 10:44:01  _____________________________________________________________________  External Attachment:    Type:   Image     Comment:   External Document

## 2010-02-08 NOTE — Assessment & Plan Note (Signed)
Summary: Pulmonary/ acute ext f/u ov   Primary Provider/Referring Provider:  [name]  CC:  rov follow up, about the same  did not get along well with spiriva , and discont. taking caused dry mouth.  History of Present Illness: 9 yowm last smoked 1983  severe COPD> FEV1 of only 36% April 2007 with a 29% improvement after bronchodilators consistent with an asthmatic component.   Baseline activity level--walks 1 mile daily with oxygen on. Maintained on Symbicort 160/4.23mcg 1 puff two times a day.   October 01, 2007  doe x 300 ft slt incline without 02 walking to shed has to stop, some worse x 6 mo rec spiriva and as needed xopenox, some better with xopenex   February 11, 2008 ov never took spiriva consistently.  No variability to doe or sign cough. rec spiriva but w/in one week cp > MCH with neg cardiac wu, still having occ cp , positional, better with rubbing chest and relaxing.  March 26, 2008 doe x 30 min mile on 02, in retrospect better on spiriva.no cough. no itching sneezing or wheezing. Try spiriva every other for several weeks then take daily, think of it like high octane fuel for your car - stop spiriva if you don't feel it's helping you ex performance or if you have any side effects Please schedule a follow-up appointment in 3 months > never took   June 30, 2008 ov no longer walking on 02, does walk to shed and back without 02 and without stopping but confused with instructions on how to take rescue rx, having variable doe minimal cough no noct c/o's.  rec trial of spiriva.  October 15, 2008 rov follow up, about the same  did not get along well with spiriva , discont. taking caused dry mouth. still definitely benefits from as needed xopenex.  Pt denies any significant sore throat, dyphagia, itching, sneezing,  nasal congestion or excess secretions or cough,  fever, chills, sweats, unintended wt loss, pleuritic or exertional cp, change in activity tolerance  orthopnea pnd or leg  swelling. Pt also denies any obvious fluctuation in symptoms with weather or environmental change or other alleviating or aggravating factors.       Current Medications (verified): 1)  Lipitor 20 Mg  Tabs (Atorvastatin Calcium) .... Once Daily 2)  Ascriptin 325 Mg Tabs (Aspirin Buf(Alhyd-Mghyd-Cacar)) .... Once Daily 3)  Atacand 8 Mg  Tabs (Candesartan Cilexetil) .... Once Daily 4)  Multivitamins   Tabs (Multiple Vitamin) .... Once Daily 5)  Oxygen 2 Liters At Bedtime and With Exertion 6)  Symbicort 160-4.5 Mcg/act  Aero (Budesonide-Formoterol Fumarate) .... 2 Puffs First Thing  in Am and 2 Puffs Again in Pm About 12 Hours Later 7)  Xopenex Hfa 45 Mcg/act Aero (Levalbuterol Tartrate) .... 2 Every 4 Hours If Needed 8)  Toprol Xl 25 Mg Xr24h-Tab (Metoprolol Succinate) .Marland Kitchen.. 1 Once Daily  Allergies (verified): 1)  ! * Omnicef 2)  Sulfa  Past History:  Past Medical History: CHRONIC RHINITIS (ICD-472.0) CHRONIC RESPIRATORY FAILURE (EAV-409.81) COPD UNSPECIFIED (ICD-496)................................................................[name]   - PFTs 05/04/05 FEV1 36% ratio 28% with 29% improvement after bronchodilators    DLCO 48%   - PFT's 03/26/08          30% ratio 34      with 14% improvement after bronchodilators  DLC0 38 %   - Start noct 02 at 2lpm 2008   - [facility] June 30, 2008 100%   - Rechallenge with spiriva June 30, 2008 >  dry mouth couldn't tolerate ISCHEMIC HEART DISEASE (ICD-414.9) HYPERTENSION (ICD-401.9) HYPERLIPIDEMIA (ICD-272.4)    Family History: Reviewed history from 01/29/2007 and no changes required. allergies and brother son heart disease in mother father two brothers  Social History: Reviewed history from 01/29/2007 and no changes required.  Quit smoking 1983  Vital Signs:  Patient profile:   77 year old male Weight:      158.2 pounds O2 Sat:      95 % on Room air Temp:     97.4 degrees F oral Pulse rate:   74 / minute BP sitting:   112 / 58  (left arm) Cuff  size:   regular  Vitals Entered By: Flo Shanks, MSA  O2 Flow:  Room air  Physical Exam  Additional Exam:  relatively robust  elderly white male in no acute distress wt  159 October 01, 2007  > wt 164 February 11, 2008 > 159 March 26, 2008 > 160 June 30, 2008 > 158 October 15, 2008  HEENT mild turbinate edema.  Oropharynx no thrush or excess pnd or cobblestoning.  No JVD or cervical adenopathy. Mild accessory muscle hypertrophy. Trachea midline, nl thryroid. Chest was hyperinflated by percussion with diminished breath sounds and marked increased exp time without wheeze. Hoover sign positive onset of inspiration. Regular rate and rhythm without murmur gallop or rub or increase P2. Decrease s1s2  No edema Abd: no hsm, nl excursion. Ext warm without cyanosis or clubbing.   Impression & Recommendations:  Problem # 1:  COPD UNSPECIFIED (ICD-496) GOLD III/IV not able to tolerate spiriva but better on xopenex.  will try change toprol to bystolic though doubt this will help much, not really anything else to offer at this point other than rehab.  I spent extra time with the patient today explaining optimal mdi  technique.  This improved from  50-75%   Each maintenance medication was reviewed in detail including most importantly the difference between maintenance and as needed and under what circumstances the prns are to be used.  See instructions for specific recommendations   Problem # 2:  HYPERTENSION, BENIGN (ICD-401.1)  The following medications were removed from the medication list:    Toprol Xl 25 Mg Xr24h-tab (Metoprolol succinate) .Marland Kitchen... 1 once daily His updated medication list for this problem includes:    Atacand 8 Mg Tabs (Candesartan cilexetil) ..... Once daily    Bystolic 5 Mg Tabs (Nebivolol hcl) ..... One tablet daily  Medications Added to Medication List This Visit: 1)  Toprol Xl 25 Mg Xr24h-tab (Metoprolol succinate) .Marland Kitchen.. 1 once daily 2)  Bystolic 5 Mg Tabs (Nebivolol hcl)  .... One tablet daily  Other Orders: Rehabilitation Referral (Rehab) Est. Patient Level IV (04540)  Patient Instructions: 1)  See Patient Care Coordinator before leaving for rehab referral 2)  try off toprol and restart Bystolic 5 mg daily 3)  Please schedule a follow-up appointment in 3 months.

## 2010-02-08 NOTE — Assessment & Plan Note (Signed)
Summary: Pulmonary/ acute ext ov, not using med calendar   Primary Provider/Referring Provider:  [name]  CC:  Dyspnea- getting worse.  History of Present Illness: 7  yowm last smoked 1983  severe COPD  FEV1 of only 36% April 2007 with a 29% improvement after bronchodilators consistent with an asthmatic component.   Baseline activity level--walk 1 mile daily with oxygen on. Maintained on Symbicort 160/4.18mcg 1 puff two times a day.    February 11, 2008 ov never took spiriva consistently.  No variability to doe or sign cough. rec spiriva but w/in one week cp > MCH with neg cardiac wu, still having occ cp , positional, better with rubbing chest and relaxing.  March 26, 2008 doe x 30 min mile on 02, in retrospect better on spiriva.no cough. no itching sneezing or wheezing. Try spiriva every other for several weeks then take daily, think of it like high octane fuel for your car - stop spiriva if you don't feel it's helping you ex performance or if you have any side effects Please schedule a follow-up appointment in 3 months > never took   June 30, 2008 ov no longer walking on 02, does walk to shed and back without 02 and without stopping but confused with instructions on how to take rescue rx, having variable doe minimal cough no noct c/o's.  rec trial of spiriva.  October 15, 2008 rov follow up, about the same  did not get along well with spiriva , discont. taking caused dry mouth. still definitely benefits from as needed xopenex.    November 19, 2008--Presents for follow up. was switched from metoprolol to bystolic at last OV.  states was feeling tired, "beat up" and lethargic so began the metoprolol again  but did not feel better.  would like to know if he may switch back to the bystolic , and also the spiriva. to help with breathing. rec resume spiriva > stopped due to urinary retention while on a trip to [facility]  May 03, 2009    c/o breathing worsening over the past 2-3 wk, gets SOB with  any activity.  He also c/o leg swelling getting worse- lasix is not helping and he c/o low BP when uses lasix. no cough.. better on 02, p saba  page 2       August 19, 2009 Acute visit.  Pt c/o wheezing and cough x 5 days. Cough is prod with frothy white sputum.  He also c/o increased SOB.  He has been using o2 all the time since symptoms started.   comfortable at rest on room air and ok lying down on 2 lpm but otherwise sob.  no cp. no purulent sputum.  mod nasal congestion ? related to 02.    September 02, 2009 --Presents for follow up and med reivew. Unfortunately  pt did not bring not any meds with him today.   He was given rechallenged with Spriiva last vist. He does not have any nocticeable improvement in breathing on Spriva and it causes worsening of urinary problems/dribbling. We reviewed his med list on MAR. Continue Symbicort 2 puffs two times a day  stop Spiriva > urinary symptoms resolved    November 04, 2009 ov cc doe x 1 month rx with nebulizer by Doctor in [place] with abrupt onset 10/25 of yellow mucus and gen chest tightness with coughing but nothing lateralizing, mild nasal congestion and watery nasal discharge also.  confused with meds, did not bring calendar. Pt denies  any significant sore throat, dysphagia, itching, sneezing,     fever, chills, sweats, unintended wt loss, pleuritic or exertional cp, hempoptysis,  orthopnea pnd or leg swelling. Pt also denies any obvious fluctuation in symptoms with weather or environmental change or other alleviating or aggravating factors.       Current Medications (verified): 1)  Lipitor 20 Mg  Tabs (Atorvastatin Calcium) .... Once Daily 2)  Ascriptin 325 Mg Tabs (Aspirin Buf(Alhyd-Mghyd-Cacar)) .... Once Daily 3)  Atacand 8 Mg  Tabs (Candesartan Cilexetil) .... Once Daily 4)  Multivitamins   Tabs (Multiple Vitamin) .... Once Daily 5)  Toprol Xl 25 Mg Xr24h-Tab (Metoprolol Succinate) .Marland Kitchen.. 1 Once Daily 6)  Symbicort 160-4.5 Mcg/act  Aero  (Budesonide-Formoterol Fumarate) .... 2 Puffs First Thing  in Am and 2 Puffs Again in Pm About 12 Hours Later 7)  Vitamin D3 1000 Unit Tabs (Cholecalciferol) .... Take 1 Tablet By Mouth Once A Day 8)  Oxygen 2 Liters At Bedtime and With Exertion .... Wear Automatically At Bedtime and With Any Activity Other Than Walking Room To Room 9)  Xopenex Hfa 45 Mcg/act Aero (Levalbuterol Tartrate) .... 2 Every 4 Hours If Needed 10)  Furosemide 20 Mg Tabs (Furosemide) .Marland Kitchen.. 1 By Mouth Once Daily As Needed Leg Swelling 11)  Nasonex 50 Mcg/act  Susp (Mometasone Furoate) .... Two Puffs Each Nostril Daily As Needed 12)  Ipratropium Neb (? Strength) .Marland Kitchen.. 1 Vial in Nebulizer Two Times A Day 13)  Xopenex Neb (? Strength) .Marland Kitchen.. 1 Vial in Nebulizer Two Times A Day  Allergies (verified): 1)  ! Sulfa 2)  ! * Omnicef  Vital Signs:  Patient profile:   77 year old male Weight:      159 pounds O2 Sat:      99 % on Room air Temp:     97.6 degrees F oral Pulse rate:   80 / minute BP sitting:   138 / 70  (left arm)  Vitals Entered By: [name] (November 04, 2009 10:03 AM)  O2 Flow:  Room air  Physical Exam  Additional Exam:  chronically ill but ambulatory elderly white male in no acute distress wt  159 October 01, 2007  > 159 March 04, 2009>    > 158 August 19, 2009 >>159 November 04, 2009  HEENT mild turbinate edema.  Oropharynx no thrush or excess pnd or cobblestoning.  No JVD or cervical adenopathy. Mild accessory muscle hypertrophy. Trachea midline, nl thryroid. Chest was hyperinflated by percussion with diminished breath sounds and marked increased exp time without wheeze. Hoover sign positive onset of inspiration. Regular rate and rhythm without murmur gallop or rub or increase P2. Decrease s1s2 Abd: no hsm, nl excursion. Ext warm without cyanosis or clubbing,  trace bilateral  edema    Impression & Recommendations:  Problem # 1:  COPD UNSPECIFIED (ICD-496)   DDX of  difficult airways managment  all start with A and  include Adherence, Ace Inhibitors, Acid Reflux, Active Sinus Disease, Alpha 1 Antitripsin deficiency, Anxiety masquerading as Airways dz,  ABPA,  allergy(esp in young), Aspiration (esp in elderly), Adverse effects of DPI,  Active smokers, plus one B  = Beta blocker use..    Adherence problematic, not using med calendar. try again  ? active sinus dz:  mostly just rhinitis despite nasonex, ? try astepro next ov once regroup with meds  See instructions for specific recommendations   Problem # 2:  CHRONIC RESPIRATORY FAILURE (ICD-518.83) well comp on present rx  Medications  Added to Medication List This Visit: 1)  Ipratropium Neb (? Strength)  .Marland Kitchen.. 1 vial in nebulizer two times a day 2)  Xopenex Neb (? Strength)  .Marland Kitchen.. 1 vial in nebulizer two times a day 3)  Prednisone 10 Mg Tabs (Prednisone) .... 4 each am x 2days, 2x2days, 1x2days and stop  Other Orders: Est. Patient Level IV (16109) Prescription Created Electronically [phone])  Patient Instructions: 1)  See Tammy NP w/in 2 weeks with all your medications and your pill box, even over the counter meds, separated in two separate bags, the ones you take no matter what vs the ones you stop once you feel better and take only as needed.  She will generate for you a new user friendly medication calendar that will put [place] all on the same page re: your medication use.  2)  Finish Zpak, the mucus should turn back to clear if not call us 3)  Use nebulizer up to every 4 hours as needed for short of breath 4)  Mucinex dm 1200mg  twice daily as needed as cough  5)  Prenisone 10 mg 4 each am x 2days, 2x2days, 1x2days and stop  Prescriptions: PREDNISONE 10 MG  TABS (PREDNISONE) 4 each am x 2days, 2x2days, 1x2days and stop  #14 x 0   Entered and Authorized by:   Nyoka Cowden MD   Signed by:   Nyoka Cowden MD on 11/04/2009   Method used:   Electronically to        [facility]. [phone]* (retail)       [address]       [place], [place]  [address]       Ph: [phone]       Fax: [phone]   RxID:   [id number]

## 2010-02-08 NOTE — Assessment & Plan Note (Signed)
Summary: nneds flu shot forgot it at his appt/cb   Nurse Visit   Allergies: 1)  ! * Omnicef 2)  Sulfa  Orders Added: 1)  Flu Vaccine 7yrs + [90658] 2)  Administration Flu vaccine - MCR [G0008]  Flu Vaccine Consent Questions     Do you have a history of severe allergic reactions to this vaccine? no    Any prior history of allergic reactions to egg and/or gelatin? no    Do you have a sensitivity to the preservative Thimersol? no    Do you have a past history of Guillan-Barre Syndrome? no    Do you currently have an acute febrile illness? no    Have you ever had a severe reaction to latex? no    Vaccine information given and explained to patient? yes    Are you currently pregnant? no    Lot Number:[id number]   Exp Date:07/08/2009   Site Given  Left Deltoid IM Tammy Scott  November 06, 2008 3:46 PM

## 2010-02-08 NOTE — Assessment & Plan Note (Signed)
Summary: NP follow up - med calendar   Primary Provider/Referring Provider:  [name]  CC:  new med calendar - pt brought all meds with him today.  states breathing is doing well.  no new complaints.  History of Present Illness: 77  yowm last smoked 1983  severe COPD  FEV1 of only 36% April 2007 with a 29% improvement after bronchodilators consistent with an asthmatic component.   Baseline activity level--walk 1 mile daily with oxygen on. Maintained on Symbicort 160/4.10mcg 1 puff two times a day.    February 11, 2008 ov never took spiriva consistently.  No variability to doe or sign cough. rec spiriva but w/in one week cp > MCH with neg cardiac wu, still having occ cp , positional, better with rubbing chest and relaxing.  March 26, 2008 doe x 30 min mile on 02, in retrospect better on spiriva.no cough. no itching sneezing or wheezing. Try spiriva every other for several weeks then take daily, think of it like high octane fuel for your car - stop spiriva if you don't feel it's helping you ex performance or if you have any side effects Please schedule a follow-up appointment in 3 months > never took   June 30, 2008 ov no longer walking on 02, does walk to shed and back without 02 and without stopping but confused with instructions on how to take rescue rx, having variable doe minimal cough no noct c/o's.  rec trial of spiriva.  October 15, 2008 rov follow up, about the same  did not get along well with spiriva , discont. taking caused dry mouth. still definitely benefits from as needed xopenex.    November 19, 2008--Presents for follow up. was switched from metoprolol to bystolic at last OV.  states was feeling tired, "beat up" and lethargic so began the metoprolol again  but did not feel better.  would like to know if he may switch back to the bystolic , and also the spiriva. to help with breathing. rec resume spiriva > stopped due to urinary retention while on a trip to [facility]  May 03, 2009     c/o breathing worsening over the past 2-3 wk, gets SOB with any activity.  He also c/o leg swelling getting worse- lasix is not helping and he c/o low BP when uses lasix. no cough.. better on 02, p saba  page 2       August 19, 2009 Acute visit.  Pt c/o wheezing and cough x 5 days. Cough is prod with frothy white sputum.  He also c/o increased SOB.  He has been using o2 all the time since symptoms started.   comfortable at rest on room air and ok lying down on 2 lpm but otherwise sob.  no cp. no purulent sputum.  mod nasal congestion ? related to 02.    September 02, 2009 --Presents for follow up and med reivew. Unfortunately  pt did not bring not any meds with him today.   He was given rechallenged with Spriiva last vist. He does not have any nocticeable improvement in breathing on Spriva and it causes worsening of urinary problems/dribbling. We reviewed his med list on MAR. Continue Symbicort 2 puffs two times a day  stop Spiriva > urinary symptoms resolved   November 04, 2009 ov cc doe x 1 month rx with nebulizer by Doctor in [place] with abrupt onset 10/25 of yellow mucus and gen chest tightness with coughing but nothing lateralizing, mild nasal congestion  and watery nasal discharge also.  confused with meds, did not bring calendar. >>rec to finish zpack and take steroid pack.  11/22/09--Presents for a follow up and med review.  Had bronchitic flare last ov , was given a steroid taper. He is feeling better and is back to his baseline. We reviewed all his meds and organized them into a med calendar. Denies chest pain,  orthopnea, hemoptysis, fever, n/v/d, edema, headache.   Medications Prior to Update: 1)  Atacand 8 Mg  Tabs (Candesartan Cilexetil) .... Once Daily 2)  Toprol Xl 25 Mg Xr24h-Tab (Metoprolol Succinate) .Marland Kitchen.. 1 Once Daily 3)  Lipitor 20 Mg  Tabs (Atorvastatin Calcium) .... Once Daily 4)  Multivitamins   Tabs (Multiple Vitamin) .... Once Daily 5)  Ascriptin 325 Mg Tabs (Aspirin  Buf(Alhyd-Mghyd-Cacar)) .... Once Daily 6)  Vitamin D3 1000 Unit Tabs (Cholecalciferol) .... Take 1 Tablet By Mouth Once A Day 7)  Symbicort 160-4.5 Mcg/act  Aero (Budesonide-Formoterol Fumarate) .... 2 Puffs First Thing  in Am and 2 Puffs Again in Pm About 12 Hours Later 8)  Ipratropium Neb (? Strength) .Marland Kitchen.. 1 Vial in Nebulizer Two Times A Day 9)  Xopenex Neb (? Strength) .Marland Kitchen.. 1 Vial in Nebulizer Two Times A Day 10)  Oxygen 2 Liters At Bedtime and With Exertion .... Wear Automatically At Bedtime and With Any Activity Other Than Walking Room To Room 11)  Furosemide 20 Mg Tabs (Furosemide) .Marland Kitchen.. 1 By Mouth Once Daily As Needed Leg Swelling 12)  Xopenex Hfa 45 Mcg/act Aero (Levalbuterol Tartrate) .... 2 Every 4 Hours If Needed 13)  Nasonex 50 Mcg/act  Susp (Mometasone Furoate) .... Two Puffs Each Nostril Daily As Needed 14)  Prednisone 10 Mg  Tabs (Prednisone) .... 4 Each Am X 2days, 2x2days, 1x2days and Stop 15)  Nebulizer Machine of Choice .... Use As Directed For Breathing Treatments  Current Medications (verified): 1)  Atacand 8 Mg  Tabs (Candesartan Cilexetil) .... Once Daily 2)  Toprol Xl 25 Mg Xr24h-Tab (Metoprolol Succinate) .Marland Kitchen.. 1 Once Daily 3)  Lipitor 20 Mg  Tabs (Atorvastatin Calcium) .... Take 1 Tab By Mouth At Bedtime 4)  Multivitamins   Tabs (Multiple Vitamin) .... Once Daily 5)  Ascriptin 325 Mg Tabs (Aspirin Buf(Alhyd-Mghyd-Cacar)) .... Once Daily 6)  Vitamin D3 1000 Unit Tabs (Cholecalciferol) .... Take 1 Tablet By Mouth Once A Day 7)  Symbicort 160-4.5 Mcg/act  Aero (Budesonide-Formoterol Fumarate) .... 2 Puffs First Thing  in Am and 2 Puffs Again in Pm About 12 Hours Later 8)  Ipratropium Bromide 0.02 % Soln (Ipratropium Bromide) .... Inhale 1 Vial Via Hhn Two Times A Day 9)  Xopenex 1.25 Mg/51ml Nebu (Levalbuterol Hcl) .... Inhale 1 Vial Via Hhn Two Times A Day 10)  Oxygen 2l/min .... Wear At Bedtime 11)  Claritin 10 Mg Tabs (Loratadine) .... Take 1 Tablet By Mouth Once A Day  As Needed 12)  Furosemide 20 Mg Tabs (Furosemide) .Marland Kitchen.. 1 By Mouth Once Daily As Needed Leg Swelling 13)  Mucinex 600 Mg Xr12h-Tab (Guaifenesin) .... Take 1-2 Tablets Every 12 Hours As Needed 14)  Xopenex Hfa 45 Mcg/act Aero (Levalbuterol Tartrate) .... 2 Every 4 Hours If Needed 15)  Xopenex 1.25 Mg/41ml Nebu (Levalbuterol Hcl) .... Inhale 1 Vial Via Hhn Every 4 Hours As Needed 16)  Oxygen 2l/min .... Wear As Needed For Shortness of Breath 17)  Zantac 150 Mg Tabs (Ranitidine Hcl) .... Per Box As Needed 18)  Nebulizer Machine of Choice .... Use As Directed For  Breathing Treatments  Allergies (verified): 1)  ! Sulfa 2)  ! Truman Hayward  Past History:  Family History: Last updated: 11/19/2008 heart disease - mother father two brothers cancer - mother(breast) stroke - older brother x3, still living at 75 allergies - brother  Social History: Last updated: 11/19/2008  Quit smoking 1983 - x33yrs, 1.5-2ppd. 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  Risk Factors: Smoking Status: quit (01/29/2007)  Past Medical History: CHRONIC RHINITIS (ICD-472.0) CHRONIC RESPIRATORY FAILURE (ICD-518.83)      - 02 rx since 2008 COPD UNSPECIFIED (ICD-496)....................................................................[name]   - PFTs 05/04/05 FEV1 36% ratio 28% with 29% improvement after bronchodilators    DLCO 48%   - PFT's 03/26/08          30% ratio 34      with 14% improvement after bronchodilators  DLC0 38 %   - Start noct 02 at 2lpm 2008 and on 03/04/09 desat @ > 185 ft so rec wear with activtiy > rm to rm   - [facility] June 30, 2008 100% > confirmed March 04, 2009  ISCHEMIC HEART DISEASE (ICD-414.9) HYPERTENSION (ICD-401.9) HYPERLIPIDEMIA (ICD-272.4) SPN RUL by cxr August 19, 2009  COMPLEX MED REGIMEN-Meds reviewed with pt education and computerized med calendar 11/22/09     Review of Systems      See  HPI  Vital Signs:  Patient profile:   77 year old male Height:      69 inches Weight:      156.50 pounds BMI:     23.19 O2 Sat:      96 % on Room air Temp:     97.6 degrees F oral Pulse rate:   71 / minute BP sitting:   134 / 76  (left arm) Cuff size:   regular  Vitals Entered By: Boone Master CNA/MA (November 22, 2009 3:11 PM)  O2 Flow:  Room air CC: new med calendar - pt brought all meds with him today.  states breathing is doing well.  no new complaints Is Patient Diabetic? No Comments Medications reviewed with patient Daytime contact number verified with patient. Boone Master CNA/MA  November 22, 2009 3:11 PM    Physical Exam  Additional Exam:  chronically ill but ambulatory elderly white male in no acute distress wt  159 October 01, 2007  > 159 March 04, 2009>    > 158 August 19, 2009 >>159 November 04, 2009  HEENT mild turbinate edema.  Oropharynx no thrush or excess pnd or cobblestoning.  No JVD or cervical adenopathy. Mild accessory muscle hypertrophy. Trachea midline, nl thryroid. Chest was hyperinflated by percussion with diminished breath sounds and marked increased exp time without wheeze. Hoover sign positive onset of inspiration. Regular rate and rhythm without murmur gallop or rub or increase P2. Decrease s1s2 Abd: no hsm, nl excursion. Ext warm without cyanosis or clubbing,  trace bilateral  edema    Impression & Recommendations:  Problem # 1:  COPD UNSPECIFIED (ICD-496) Recent flare, now resolved.   Meds reviewed with pt education and computerized med calendar completed/adjusted.   Plan:   Follow med calendar closely and bring to each visit.  Change tagamet to zantac for as needed heartburn.  follow up Dr. Sherene Sires in 6-8 weeks and as needed  Please contact office for sooner follow up if symptoms do not improve or worsen   Medications Added to Medication List This Visit: 1)  Lipitor 20  Mg Tabs (Atorvastatin calcium) .... Take 1 tab by mouth at  bedtime 2)  Ipratropium Bromide 0.02 % Soln (Ipratropium bromide) .... Inhale 1 vial via hhn two times a day 3)  Xopenex 1.25 Mg/30ml Nebu (Levalbuterol hcl) .... Inhale 1 vial via hhn two times a day 4)  Oxygen 2l/min  .... Wear at bedtime 5)  Claritin 10 Mg Tabs (Loratadine) .... Take 1 tablet by mouth once a day as needed 6)  Mucinex 600 Mg Xr12h-tab (Guaifenesin) .... Take 1-2 tablets every 12 hours as needed 7)  Xopenex 1.25 Mg/27ml Nebu (Levalbuterol hcl) .... Inhale 1 vial via hhn every 4 hours as needed 8)  Oxygen 2l/min  .... Wear as needed for shortness of breath 9)  Zantac 150 Mg Tabs (Ranitidine hcl) .... Per box as needed  Complete Medication List: 1)  Atacand 8 Mg Tabs (Candesartan cilexetil) .... Once daily 2)  Toprol Xl 25 Mg Xr24h-tab (Metoprolol succinate) .Marland Kitchen.. 1 once daily 3)  Lipitor 20 Mg Tabs (Atorvastatin calcium) .... Take 1 tab by mouth at bedtime 4)  Multivitamins Tabs (Multiple vitamin) .... Once daily 5)  Ascriptin 325 Mg Tabs (Aspirin buf(alhyd-mghyd-cacar)) .... Once daily 6)  Vitamin D3 1000 Unit Tabs (Cholecalciferol) .... Take 1 tablet by mouth once a day 7)  Symbicort 160-4.5 Mcg/act Aero (Budesonide-formoterol fumarate) .... 2 puffs first thing  in am and 2 puffs again in pm about 12 hours later 8)  Ipratropium Bromide 0.02 % Soln (Ipratropium bromide) .... Inhale 1 vial via hhn two times a day 9)  Xopenex 1.25 Mg/45ml Nebu (Levalbuterol hcl) .... Inhale 1 vial via hhn two times a day 10)  Oxygen 2l/min  .... Wear at bedtime 11)  Claritin 10 Mg Tabs (Loratadine) .... Take 1 tablet by mouth once a day as needed 12)  Furosemide 20 Mg Tabs (Furosemide) .Marland Kitchen.. 1 by mouth once daily as needed leg swelling 13)  Mucinex 600 Mg Xr12h-tab (Guaifenesin) .... Take 1-2 tablets every 12 hours as needed 14)  Xopenex Hfa 45 Mcg/act Aero (Levalbuterol tartrate) .... 2 every 4 hours if needed 15)  Xopenex 1.25 Mg/45ml Nebu (Levalbuterol hcl) .... Inhale 1 vial via hhn every 4  hours as needed 16)  Oxygen 2l/min  .... Wear as needed for shortness of breath 17)  Zantac 150 Mg Tabs (Ranitidine hcl) .... Per box as needed 18)  Nebulizer Machine of Choice  .... Use as directed for breathing treatments  Other Orders: Est. Patient Level III (16109)  Patient Instructions: 1)  Glad you are feeling better.  2)  Follow med calendar closely and bring to each visit.  3)  Change tagamet to zantac for as needed heartburn.  4)  follow up Dr. Sherene Sires in 6-8 weeks and as needed  5)  Please contact office for sooner follow up if symptoms do not improve or worsen  Prescriptions: ATACAND 8 MG  TABS (CANDESARTAN CILEXETIL) once daily  #30 Each x 6   Entered and Authorized by:   Rubye Oaks NP   Signed by:   Rubye Oaks NP on 11/22/2009   Method used:   Electronically to        [facility]. [phone]* (retail)       [address]       [place], [place]  [address]       Ph: [phone]       Fax: [phone]   RxID:   [id number]

## 2010-02-08 NOTE — Assessment & Plan Note (Signed)
Summary: NP follow up - med calendar   Primary Provider/Referring Provider:  [name]  CC:  new med calendar - pt did not bring not any meds with him today.  states his breathing has improved but the cough and wheezing and SOB still linger.  History of Present Illness: 33 yowm last smoked 1983  severe COPD  FEV1 of only 36% April 2007 with a 29% improvement after bronchodilators consistent with an asthmatic component.   Baseline activity level--walk 1 mile daily with oxygen on. Maintained on Symbicort 160/4.67mcg 1 puff two times a day.    February 11, 2008 ov never took spiriva consistently.  No variability to doe or sign cough. rec spiriva but w/in one week cp > MCH with neg cardiac wu, still having occ cp , positional, better with rubbing chest and relaxing.  March 26, 2008 doe x 30 min mile on 02, in retrospect better on spiriva.no cough. no itching sneezing or wheezing. Try spiriva every other for several weeks then take daily, think of it like high octane fuel for your car - stop spiriva if you don't feel it's helping you ex performance or if you have any side effects Please schedule a follow-up appointment in 3 months > never took   June 30, 2008 ov no longer walking on 02, does walk to shed and back without 02 and without stopping but confused with instructions on how to take rescue rx, having variable doe minimal cough no noct c/o's.  rec trial of spiriva.  October 15, 2008 rov follow up, about the same  did not get along well with spiriva , discont. taking caused dry mouth. still definitely benefits from as needed xopenex.    November 19, 2008--Presents for follow up. was switched from metoprolol to bystolic at last OV.  states was feeling tired, "beat up" and lethargic so began the metoprolol again  but did not feel better.  would like to know if he may switch back to the bystolic , and also the spiriva. to help with breathing. rec resume spiriva > stopped due to urinary retention while  on a trip to [facility] [place] [facility]  May 03, 2009    c/o breathing worsening over the past 2-3 wk, gets SOB with any activity.  He also c/o leg swelling getting worse- lasix is not helping and he c/o low BP when uses lasix. no cough.. better on 02, p saba  page 2       August 19, 2009 Acute visit.  Pt c/o wheezing and cough x 5 days. Cough is prod with frothy white sputum.  He also c/o increased SOB.  He has been using o2 all the time since symptoms started.   comfortable at rest on room air and ok lying down on 2 lpm but otherwise sob.  no cp. no purulent sputum.  mod nasal congestion ? related to 02.    September 02, 2009 --Presents for follow up and med reivew. Unfortunately  pt did not bring not any meds with him today.   He was given rechallenged with Spriiva last vist. He does not have any nocticeable improvement in breathing on Spriva and it causes worsening of urinary problems/dribbling. We reviewed his med list on MAR. He is breathing is slightly better than last visit. Still wears out easily. Denies chest pain, dyspnea, orthopnea, hemoptysis, fever, n/v/d, edema, headache.  Medications Prior to Update: 1)  Lipitor 20 Mg  Tabs (Atorvastatin Calcium) .... Once Daily 2)  Ascriptin 325 Mg  Tabs (Aspirin Buf(Alhyd-Mghyd-Cacar)) .... Once Daily 3)  Atacand 8 Mg  Tabs (Candesartan Cilexetil) .... Once Daily 4)  Multivitamins   Tabs (Multiple Vitamin) .... Once Daily 5)  Toprol Xl 25 Mg Xr24h-Tab (Metoprolol Succinate) .Marland Kitchen.. 1 Once Daily 6)  Symbicort 160-4.5 Mcg/act  Aero (Budesonide-Formoterol Fumarate) .... 2 Puffs First Thing  in Am and 2 Puffs Again in Pm About 12 Hours Later 7)  Oxygen 2 Liters At Bedtime and With Exertion .... Wear Automatically At Bedtime and With Any Activity Other Than Walking Room To Room 8)  Xopenex Hfa 45 Mcg/act Aero (Levalbuterol Tartrate) .... 2 Every 4 Hours If Needed 9)  Furosemide 20 Mg Tabs (Furosemide) .Marland Kitchen.. 1 By Mouth Once Daily As Needed Leg Swelling 10)  Prednisone 10  Mg Tabs (Prednisone) .... 4 Each Am X 3 Days, 3 Each Am X 3 Days, 2 Each Am X 3 Days, 1 Each Am X 3 Days, Then Stop 11)  Nasonex 50 Mcg/act  Susp (Mometasone Furoate) .... Two Puffs Each Nostril Daily As Needed  Allergies (verified): 1)  ! Sulfa 2)  ! Truman Hayward  Past History:  Past Medical History: Last updated: 08/19/2009 CHRONIC RHINITIS (ICD-472.0) CHRONIC RESPIRATORY FAILURE (ICD-518.83)      - 02 rx since 2008 COPD UNSPECIFIED (ICD-496)....................................................................[name]   - PFTs 05/04/05 FEV1 36% ratio 28% with 29% improvement after bronchodilators    DLCO 48%   - PFT's 03/26/08          30% ratio 34      with 14% improvement after bronchodilators  DLC0 38 %   - Start noct 02 at 2lpm 2008 and on 03/04/09 desat @ > 185 ft so rec wear with activtiy > rm to rm   - [facility] June 30, 2008 100% > confirmed March 04, 2009  ISCHEMIC HEART DISEASE (ICD-414.9) HYPERTENSION (ICD-401.9) HYPERLIPIDEMIA (ICD-272.4) SPN RUL by cxr August 19, 2009     Family History: Last updated: 11/19/2008 heart disease - mother father two brothers cancer - mother(breast) stroke - older brother x3, still living at 39 allergies - brother  Social History: Last updated: 11/19/2008  Quit smoking 1983 - x10yrs, 1.5-2ppd. 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  Risk Factors: Smoking Status: quit (01/29/2007)  Vital Signs:  Patient profile:   77 year old male Height:      69 inches Weight:      158 pounds BMI:     23.42 O2 Sat:      90 % on Room air Temp:     97.3 degrees F oral Pulse rate:   79 / minute BP sitting:   130 / 78  (left arm) Cuff size:   regular  Vitals Entered By: Boone Master CNA/MA (September 02, 2009 2:11 PM)  O2 Flow:  Room air CC: new med calendar - pt did not bring not any meds with him today.  states his breathing has improved but the cough,  wheezing and SOB still linger Is Patient Diabetic? No Comments Medications reviewed with patient Daytime contact number verified with patient. Boone Master CNA/MA  September 02, 2009 2:16 PM    Physical Exam  Additional Exam:  chronically ill but ambulatory elderly white male in no acute distress wt  159 October 01, 2007  > 159 March 04, 2009>  159 May 03, 2009 > 159 May 03, 2009 > 158 August 19, 2009 >>158 8/25 HEENT mild turbinate edema.  Oropharynx no thrush or excess pnd or cobblestoning.  No JVD or cervical adenopathy. Mild accessory muscle hypertrophy. Trachea midline, nl thryroid. Chest was hyperinflated by percussion with diminished breath sounds and marked increased exp time without wheeze. Hoover sign positive onset of inspiration. Regular rate and rhythm without murmur gallop or rub or increase P2. Decrease s1s2 Abd: no hsm, nl excursion. Ext warm without cyanosis or clubbing,  trace bilateral  edema    Impression & Recommendations:  Problem # 1:  COPD UNSPECIFIED (ICD-496)  Recent flare-slowly improving.  will hold on Spiriva for now.  Plan:  Continue Symbicort 2 puffs two times a day  May stop Spiriva.  May restart pulmonary rehab.  follow up Dr. Sherene Sires in 3 months and as needed  Please contact office for sooner follow up if needed.   Orders: Est. Patient Level II (04540)  Medications Added to Medication List This Visit: 1)  Vitamin D3 1000 Unit Tabs (Cholecalciferol) .... Take 1 tablet by mouth once a day  Complete Medication List: 1)  Lipitor 20 Mg Tabs (Atorvastatin calcium) .... Once daily 2)  Ascriptin 325 Mg Tabs (Aspirin buf(alhyd-mghyd-cacar)) .... Once daily 3)  Atacand 8 Mg Tabs (Candesartan cilexetil) .... Once daily 4)  Multivitamins Tabs (Multiple vitamin) .... Once daily 5)  Toprol Xl 25 Mg Xr24h-tab (Metoprolol succinate) .Marland Kitchen.. 1 once daily 6)  Symbicort 160-4.5 Mcg/act Aero (Budesonide-formoterol fumarate) .... 2 puffs first thing  in am and 2  puffs again in pm about 12 hours later 7)  Vitamin D3 1000 Unit Tabs (Cholecalciferol) .... Take 1 tablet by mouth once a day 8)  Oxygen 2 Liters At Bedtime and With Exertion  .... Wear automatically at bedtime and with any activity other than walking room to room 9)  Xopenex Hfa 45 Mcg/act Aero (Levalbuterol tartrate) .... 2 every 4 hours if needed 10)  Furosemide 20 Mg Tabs (Furosemide) .Marland Kitchen.. 1 by mouth once daily as needed leg swelling 11)  Nasonex 50 Mcg/act Susp (Mometasone furoate) .... Two puffs each nostril daily as needed  Patient Instructions: 1)  Continue Symbicort 2 puffs two times a day  2)  May stop Spiriva.  3)  May restart pulmonary rehab.  4)  follow up Dr. Sherene Sires in 3 months and as needed  5)  Please contact office for sooner follow up if needed.

## 2010-02-08 NOTE — Assessment & Plan Note (Signed)
Summary: Pulmonary/ ext ov eval doe   Primary Provider/Referring Provider:  [name]  CC:  6 wk followup.  Pt c/o breathing worsening over the past 2-3 wks.  States that she gets SOB with any activity.  He also c/o leg swelling getting worse- lasix is not helping and he c/o low BP when uses lasix.Marland Kitchen  History of Present Illness: 70 yowm last smoked 1983  severe COPD  FEV1 of only 36% April 2007 with a 29% improvement after bronchodilators consistent with an asthmatic component.   Baseline activity level--walk 1 mile daily with oxygen on. Maintained on Symbicort 160/4.68mcg 1 puff two times a day.    February 11, 2008 ov never took spiriva consistently.  No variability to doe or sign cough. rec spiriva but w/in one week cp > MCH with neg cardiac wu, still having occ cp , positional, better with rubbing chest and relaxing.  March 26, 2008 doe x 30 min mile on 02, in retrospect better on spiriva.no cough. no itching sneezing or wheezing. Try spiriva every other for several weeks then take daily, think of it like high octane fuel for your car - stop spiriva if you don't feel it's helping you ex performance or if you have any side effects Please schedule a follow-up appointment in 3 months > never took   June 30, 2008 ov no longer walking on 02, does walk to shed and back without 02 and without stopping but confused with instructions on how to take rescue rx, having variable doe minimal cough no noct c/o's.  rec trial of spiriva.  October 15, 2008 rov follow up, about the same  did not get along well with spiriva , discont. taking caused dry mouth. still definitely benefits from as needed xopenex.    November 19, 2008--Presents for follow up. was switched from metoprolol to bystolic at last OV.  states was feeling tired, "beat up" and lethargic so began the metoprolol again  but did not feel better.  would like to know if he may switch back to the bystolic , and also the spiriva. to help with breathing.  rec resume spiriva  May 03, 2009    c/o breathing worsening over the past 2-3 wk, gets SOB with any activity.  He also c/o leg swelling getting worse- lasix is not helping and he c/o low BP when uses lasix. no cough. Pt denies any significant sore throat, dysphagia, itching, sneezing,  nasal congestion or excess secretions,  fever, chills, sweats. better on 02, p saba      Current Medications (verified): 1)  Lipitor 20 Mg  Tabs (Atorvastatin Calcium) .... Once Daily 2)  Ascriptin 325 Mg Tabs (Aspirin Buf(Alhyd-Mghyd-Cacar)) .... Once Daily 3)  Atacand 8 Mg  Tabs (Candesartan Cilexetil) .... Once Daily 4)  Multivitamins   Tabs (Multiple Vitamin) .... Once Daily 5)  Oxygen 2 Liters At Bedtime and With Exertion 6)  Toprol Xl 25 Mg Xr24h-Tab (Metoprolol Succinate) .Marland Kitchen.. 1 Once Daily 7)  Symbicort 160-4.5 Mcg/act  Aero (Budesonide-Formoterol Fumarate) .... 2 Puffs First Thing  in Am and 2 Puffs Again in Pm About 12 Hours Later 8)  Spiriva Handihaler 18 Mcg Caps (Tiotropium Bromide Monohydrate) .Marland Kitchen.. 1 Puff Once Daily 9)  Xopenex Hfa 45 Mcg/act Aero (Levalbuterol Tartrate) .... 2 Every 4 Hours If Needed 10)  Furosemide 20 Mg Tabs (Furosemide) .Marland Kitchen.. 1 By Mouth Once Daily As Needed Leg Swelling  Allergies (verified): 1)  ! Sulfa 2)  ! Truman Hayward  Past  History:  Past Medical History: CHRONIC RHINITIS (ICD-472.0) CHRONIC RESPIRATORY FAILURE (QMV-784.69) COPD UNSPECIFIED (ICD-496)..................................................................[name]   - PFTs 05/04/05 FEV1 36% ratio 28% with 29% improvement after bronchodilators    DLCO 48%   - PFT's 03/26/08          30% ratio 34      with 14% improvement after bronchodilators  DLC0 38 %   - Start noct 02 at 2lpm 2008 and on 03/04/09 desat @ > 185 ft so rec wear with activtiy > rm to rm   - [facility] June 30, 2008 100% > confirmed March 04, 2009  ISCHEMIC HEART DISEASE (ICD-414.9) HYPERTENSION (ICD-401.9) HYPERLIPIDEMIA (ICD-272.4)    Vital  Signs:  Patient profile:   77 year old male Weight:      159.50 pounds O2 Sat:      97 % on Room air Temp:     97.4 degrees F oral Pulse rate:   78 / minute BP sitting:   134 / 70  (left arm)  Vitals Entered By: [name] (May 03, 2009 2:47 PM)  O2 Flow:  Room air  Physical Exam  Additional Exam:  relatively robust  elderly white male in no acute distress wt  159 October 01, 2007  >158  January 21, 2009 > 159 March 04, 2009>  159 May 03, 2009 > 159 May 03, 2009  HEENT mild turbinate edema.  Oropharynx no thrush or excess pnd or cobblestoning.  No JVD or cervical adenopathy. Mild accessory muscle hypertrophy. Trachea midline, nl thryroid. Chest was hyperinflated by percussion with diminished breath sounds and marked increased exp time without wheeze. Hoover sign positive onset of inspiration. Regular rate and rhythm without murmur gallop or rub or increase P2. Decrease s1s2 Abd: no hsm, nl excursion. Ext warm without cyanosis or clubbing,  trace bilateral  edema    Total Bilirubin           1.0 mg/dL                   6.2-9.5   Direct Bilirubin          0.2 mg/dL                   2.8-4.1   Alkaline Phosphatase      80 U/L                      39-117   AST                       26 U/L                      0-37   ALT                       20 U/L                      0-53   Total Protein             6.6 g/dL                    3.2-4.4   Albumin                   4.6 g/dL                    0.1-0.2  Tests: (2)  TSH (TSH)   FastTSH                   2.76 uIU/mL                 0.35-5.50  Tests: (3) BMP (METABOL)   Sodium                    141 mEq/L                   135-145   Potassium                 5.1 mEq/L                   3.5-5.1   Chloride                  102 mEq/L                   96-112   Carbon Dioxide            31 mEq/L                    19-32   Glucose                   89 mg/dL                    81-19   BUN                       13 mg/dL                     1-47   Creatinine                1.1 mg/dL                   8.2-9.5   Calcium                   9.7 mg/dL                   6.2-13.0   GFR                       68.86 mL/min                >60  Impression & Recommendations:  Problem # 1:  COPD UNSPECIFIED (ICD-496) GOLD IV COPD with worsening ex tol but def response in past to short courses of steroids sytemically  DDX of  difficult airways managment all start with A and  include Adherence, Ace Inhibitors, Acid Reflux, Active Sinus Disease, Alpha 1 Antitripsin deficiency, Anxiety masquerading as Airways dz,  ABPA,  allergy(esp in young), Aspiration (esp in elderly), Adverse effects of DPI,  Active smokers, plus one B  = Beta blocker use..    Adherence is an issue because he can't really take deep breaths on symbicort when he's air trapping and declines using xopenex more aggressively.  rec short course prednisone to see if we can achieve better "high water mark" then maintain it on symbicort which he's doing better with in terms of optimal HFA today    Each maintenance medication was reviewed in detail including most importantly the difference between maintenance and as needed and under what circumstances the prns are to be used. See instructions for specific  recommendations   Problem # 2:  CHRONIC RESPIRATORY FAILURE (ICD-518.83) clearly better when uses 02   Problem # 3:  EDEMA (ICD-782.3) c/w cor pulmonale with no other obvious concern so just needs to wear 02 more.  Note K on high side on atacand so not a candidate for aldactone unless off atacand and even then might be difficult to use this, defer to Dr Katrinka Blazing.  Each maintenance medication was reviewed in detail including most importantly the difference between maintenance prns and under what circumstances the prns are to be used.  In addition, these two groups (for which the patient should keep up with refills) were distinguished from a third group :  meds that are used only short  term with the intent to complete a course of therapy and then not refill them.  The med list was then fully reconciled and reorganized to reflect this important distinction.   Medications Added to Medication List This Visit: 1)  Oxygen 2 Liters At Bedtime and With Exertion  .... Wear automatically at bedtime and with any activity other than walking room to room 2)  Nasonex 50 Mcg/act Susp (Mometasone furoate) .... Two puffs each nostril daily as needed  Complete Medication List: 1)  Lipitor 20 Mg Tabs (Atorvastatin calcium) .... Once daily 2)  Ascriptin 325 Mg Tabs (Aspirin buf(alhyd-mghyd-cacar)) .... Once daily 3)  Atacand 8 Mg Tabs (Candesartan cilexetil) .... Once daily 4)  Multivitamins Tabs (Multiple vitamin) .... Once daily 5)  Oxygen 2 Liters At Bedtime and With Exertion  .... Wear automatically at bedtime and with any activity other than walking room to room 6)  Toprol Xl 25 Mg Xr24h-tab (Metoprolol succinate) .Marland Kitchen.. 1 once daily 7)  Symbicort 160-4.5 Mcg/act Aero (Budesonide-formoterol fumarate) .... 2 puffs first thing  in am and 2 puffs again in pm about 12 hours later 8)  Spiriva Handihaler 18 Mcg Caps (Tiotropium bromide monohydrate) .Marland Kitchen.. 1 puff once daily 9)  Xopenex Hfa 45 Mcg/act Aero (Levalbuterol tartrate) .... 2 every 4 hours if needed 10)  Furosemide 20 Mg Tabs (Furosemide) .Marland Kitchen.. 1 by mouth once daily as needed leg swelling 11)  Nasonex 50 Mcg/act Susp (Mometasone furoate) .... Two puffs each nostril daily as needed  Other Orders: TLB-Hepatic/Liver Function Pnl (80076-HEPATIC) TLB-TSH (Thyroid Stimulating Hormone) (84443-TSH) TLB-BMP (Basic Metabolic Panel-BMET) (80048-METABOL) Prescription Created Electronically [phone]) Est. Patient Level IV (74259)  Patient Instructions: 1)  Place 02 on at bedtime and with any activity other than sitting or walking room to room this will help your breathing as well as your tendency to retain fluid  2)  Return to office in 3 months,  sooner if needed  3)  DEFER DIURETIC RX TO DR Katrinka Blazing AS k IS 5.1 ON ATACAND Prescriptions: NASONEX 50 MCG/ACT  SUSP (MOMETASONE FUROATE) Two puffs each nostril daily as needed  #1 x 11   Entered and Authorized by:   Nyoka Cowden MD   Signed by:   Nyoka Cowden MD on 05/03/2009   Method used:   Electronically to        [facility]. [phone]* (retail)       [address]       [place], [place]  [address]       Ph: [phone]       Fax: [phone]   RxID:   [id number]

## 2010-02-08 NOTE — Progress Notes (Signed)
Summary: Took extra spiriva  Phone Note Call from Patient   Caller: Patient Summary of Call: took extra dose of spiriva this am by accident ,  no symptoms sev hours later so reassured should be ok, call if severe blurred vz or urinary retention Initial call taken by: Nyoka Cowden MD,  June 12, 2009 2:53 PM

## 2010-02-10 ENCOUNTER — Encounter (HOSPITAL_COMMUNITY): Payer: Medicare Other | Attending: Internal Medicine

## 2010-02-10 DIAGNOSIS — J4489 Other specified chronic obstructive pulmonary disease: Secondary | ICD-10-CM | POA: Insufficient documentation

## 2010-02-10 DIAGNOSIS — G4733 Obstructive sleep apnea (adult) (pediatric): Secondary | ICD-10-CM | POA: Insufficient documentation

## 2010-02-10 DIAGNOSIS — J449 Chronic obstructive pulmonary disease, unspecified: Secondary | ICD-10-CM | POA: Insufficient documentation

## 2010-02-10 DIAGNOSIS — I10 Essential (primary) hypertension: Secondary | ICD-10-CM | POA: Insufficient documentation

## 2010-02-10 DIAGNOSIS — E785 Hyperlipidemia, unspecified: Secondary | ICD-10-CM | POA: Insufficient documentation

## 2010-02-10 DIAGNOSIS — J984 Other disorders of lung: Secondary | ICD-10-CM | POA: Insufficient documentation

## 2010-02-10 DIAGNOSIS — I259 Chronic ischemic heart disease, unspecified: Secondary | ICD-10-CM | POA: Insufficient documentation

## 2010-02-10 DIAGNOSIS — Z5189 Encounter for other specified aftercare: Secondary | ICD-10-CM | POA: Insufficient documentation

## 2010-02-10 DIAGNOSIS — J9610 Chronic respiratory failure, unspecified whether with hypoxia or hypercapnia: Secondary | ICD-10-CM | POA: Insufficient documentation

## 2010-02-10 DIAGNOSIS — J310 Chronic rhinitis: Secondary | ICD-10-CM | POA: Insufficient documentation

## 2010-02-10 NOTE — Miscellaneous (Signed)
Summary: Request for PFT record/[place]   Request for PFT record/[place]   Imported By: Sherian Rein 12/24/2009 14:58:15  _____________________________________________________________________  External Attachment:    Type:   Image     Comment:   External Document

## 2010-02-10 NOTE — Progress Notes (Signed)
Summary: Change Atacand > diovan 160-pt declined new rx prefers atacand  Phone Note Outgoing Call   Call placed by: Michel Bickers CMA,  January 28, 2010 10:15 AM Call placed to: [facility] [phone] Summary of Call: [facility] [address] requesting PA for Atacand 8mg  tabs.  Per insurance, the preferred medications are:  Diovan, Micardis or Benicar.  Would one of the alternatives be appropriate for this patient? Please advise.  [facility] [address]  [phone] Initial call taken by: Michel Bickers CMA,  January 28, 2010 10:18 AM  Follow-up for Phone Call        diovan 160 one daily Follow-up by: Nyoka Cowden MD,  January 28, 2010 1:37 PM  Additional Follow-up for Phone Call Additional follow up Details #1::        New RX phoned to pharmacy. LMOMTCB for pt.Michel Bickers CMA  January 28, 2010 2:12 PM  Pt returned call from triage. Tivis Ringer, CNA  January 28, 2010 3:24 PM    Additional Follow-up for Phone Call Additional follow up Details #2::    Spoke with pt and advised of the above.  He states that he is happy with atacand and does not want to change.  He states that he is fine with paying out of pocket for this.  I advised will call and cancel rx for diovan. Spoke with Thayer Ohm at [place] and made aware of this and he verbalized understanding Follow-up by: [name],  January 28, 2010 4:08 PM  New/Updated Medications: DIOVAN 160 MG TABS (VALSARTAN) 1 by mouth daily Prescriptions: DIOVAN 160 MG TABS (VALSARTAN) 1 by mouth daily  #30 x 6   Entered by:   Michel Bickers CMA   Authorized by:   Nyoka Cowden MD   Signed by:   Michel Bickers CMA on 01/28/2010   Method used:   Telephoned to ...       [facility] [address] [phone]* (retail)       [address]       [place], [place]  [address]       Ph: [phone]       Fax: [phone]   RxID:   [id number]

## 2010-02-10 NOTE — Progress Notes (Signed)
Summary: ipratropium refill  Phone Note Call from Patient Call back at Home Phone [phone]   Caller: Patient Call For: DR. Sherene Sires Summary of Call: Patient phoned he needs a prescription for Ipratropium 0.02 called into [facility] at [address] and [address]. Patient can be reached at [phone] Initial call taken by: Vedia Coffer,  January 12, 2010 3:24 PM  Follow-up for Phone Call        per 10.27.11 ov w/ MW, pt was prescribed this med by a physician in [place].  called spoke with patient who verified this.  med never refilled by this office.  will forward to MW to get okay to begin filling this med.  pt aware MW not in the office until tomorrow morning.  [facility] [place] / Veterinary surgeon.  Dr. Sherene Sires, can we start filling pt's ipratropium? Follow-up by: Boone Master CNA/MA,  January 12, 2010 4:49 PM  Additional Follow-up for Phone Call Additional follow up Details #1::        ok Additional Follow-up by: Nyoka Cowden MD,  January 12, 2010 4:58 PM    Additional Follow-up for Phone Call Additional follow up Details #2::    rx sent to [facility].  pt is aware. Boone Master CNA/MA  January 12, 2010 5:07 PM   Prescriptions: IPRATROPIUM BROMIDE 0.02 % SOLN (IPRATROPIUM BROMIDE) inhale 1 vial via [facility] two times a day  #60 x 5   Entered by:   Boone Master CNA/MA   Authorized by:   Nyoka Cowden MD   Signed by:   Boone Master CNA/MA on 01/12/2010   Method used:   Electronically to        [facility]. [phone]* (retail)       [address]       [place], [place]  [address]       Ph: [phone]       Fax: [phone]   RxID:   [id number]

## 2010-02-15 ENCOUNTER — Encounter (HOSPITAL_COMMUNITY): Payer: Medicare Other

## 2010-02-17 ENCOUNTER — Encounter (HOSPITAL_COMMUNITY): Payer: Medicare Other

## 2010-02-18 ENCOUNTER — Ambulatory Visit (INDEPENDENT_AMBULATORY_CARE_PROVIDER_SITE_OTHER): Payer: Medicare Other | Admitting: Adult Health

## 2010-02-18 ENCOUNTER — Encounter: Payer: Self-pay | Admitting: Adult Health

## 2010-02-18 DIAGNOSIS — J4489 Other specified chronic obstructive pulmonary disease: Secondary | ICD-10-CM

## 2010-02-18 DIAGNOSIS — J209 Acute bronchitis, unspecified: Secondary | ICD-10-CM

## 2010-02-18 DIAGNOSIS — J449 Chronic obstructive pulmonary disease, unspecified: Secondary | ICD-10-CM

## 2010-02-21 ENCOUNTER — Telehealth (INDEPENDENT_AMBULATORY_CARE_PROVIDER_SITE_OTHER): Payer: Self-pay | Admitting: *Deleted

## 2010-02-22 ENCOUNTER — Encounter (HOSPITAL_COMMUNITY): Payer: Medicare Other

## 2010-02-22 ENCOUNTER — Emergency Department (HOSPITAL_COMMUNITY): Payer: Medicare Other

## 2010-02-22 ENCOUNTER — Inpatient Hospital Stay (HOSPITAL_COMMUNITY)
Admission: EM | Admit: 2010-02-22 | Discharge: 2010-02-25 | DRG: 190 | Disposition: A | Discharge status: Home / Self Care | Payer: Medicare Other | Attending: Internal Medicine | Admitting: Internal Medicine

## 2010-02-22 DIAGNOSIS — I6529 Occlusion and stenosis of unspecified carotid artery: Secondary | ICD-10-CM | POA: Diagnosis present

## 2010-02-22 DIAGNOSIS — I2510 Atherosclerotic heart disease of native coronary artery without angina pectoris: Secondary | ICD-10-CM | POA: Diagnosis present

## 2010-02-22 DIAGNOSIS — J441 Chronic obstructive pulmonary disease with (acute) exacerbation: Principal | ICD-10-CM | POA: Diagnosis present

## 2010-02-22 DIAGNOSIS — R0789 Other chest pain: Secondary | ICD-10-CM | POA: Diagnosis present

## 2010-02-22 DIAGNOSIS — J9620 Acute and chronic respiratory failure, unspecified whether with hypoxia or hypercapnia: Secondary | ICD-10-CM | POA: Diagnosis present

## 2010-02-22 DIAGNOSIS — Z7982 Long term (current) use of aspirin: Secondary | ICD-10-CM

## 2010-02-22 DIAGNOSIS — I10 Essential (primary) hypertension: Secondary | ICD-10-CM | POA: Diagnosis present

## 2010-02-22 DIAGNOSIS — Z951 Presence of aortocoronary bypass graft: Secondary | ICD-10-CM

## 2010-02-22 DIAGNOSIS — I509 Heart failure, unspecified: Secondary | ICD-10-CM | POA: Diagnosis present

## 2010-02-22 DIAGNOSIS — I739 Peripheral vascular disease, unspecified: Secondary | ICD-10-CM | POA: Diagnosis present

## 2010-02-22 HISTORY — DX: Chronic obstructive pulmonary disease, unspecified: J44.9

## 2010-02-22 HISTORY — DX: Atherosclerotic heart disease of native coronary artery without angina pectoris: I25.10

## 2010-02-22 HISTORY — DX: Essential (primary) hypertension: I10

## 2010-02-22 LAB — DIFFERENTIAL
Basophils Absolute: 0 10*3/uL (ref 0.0–0.1)
Basophils Relative: 0 % (ref 0–1)
Eosinophils Absolute: 0 10*3/uL (ref 0.0–0.7)
Eosinophils Relative: 0 % (ref 0–5)
Lymphocytes Relative: 4 % — ABNORMAL LOW (ref 12–46)
Lymphs Abs: 0.4 10*3/uL — ABNORMAL LOW (ref 0.7–4.0)
Monocytes Absolute: 0.4 10*3/uL (ref 0.1–1.0)
Monocytes Relative: 4 % (ref 3–12)
Neutro Abs: 8.5 10*3/uL — ABNORMAL HIGH (ref 1.7–7.7)
Neutrophils Relative %: 92 % — ABNORMAL HIGH (ref 43–77)

## 2010-02-22 LAB — POCT I-STAT 3, ART BLOOD GAS (G3+)
Acid-Base Excess: 3 mmol/L — ABNORMAL HIGH (ref 0.0–2.0)
Bicarbonate: 28.4 mEq/L — ABNORMAL HIGH (ref 20.0–24.0)
O2 Saturation: 100 %
Patient temperature: 98.6
TCO2: 30 mmol/L (ref 0–100)
pCO2 arterial: 47.1 mmHg — ABNORMAL HIGH (ref 35.0–45.0)
pH, Arterial: 7.389 (ref 7.350–7.450)
pO2, Arterial: 235 mmHg — ABNORMAL HIGH (ref 80.0–100.0)

## 2010-02-22 LAB — BASIC METABOLIC PANEL
BUN: 17 mg/dL (ref 6–23)
CO2: 26 mEq/L (ref 19–32)
Calcium: 9.5 mg/dL (ref 8.4–10.5)
Chloride: 95 mEq/L — ABNORMAL LOW (ref 96–112)
Creatinine, Ser: 1.06 mg/dL (ref 0.4–1.5)
GFR calc Af Amer: >60 mL/min (ref >60)
GFR calc non Af Amer: >60 mL/min (ref >60)
Glucose, Bld: 138 mg/dL — ABNORMAL HIGH (ref 70–99)
Potassium: 4.2 mEq/L (ref 3.5–5.1)
Sodium: 130 mEq/L — ABNORMAL LOW (ref 135–145)

## 2010-02-22 LAB — CBC
HCT: 36.7 % — ABNORMAL LOW (ref 39.0–52.0)
Hemoglobin: 12.9 g/dL — ABNORMAL LOW (ref 13.0–17.0)
MCH: 32.6 pg (ref 26.0–34.0)
MCHC: 35.1 g/dL (ref 30.0–36.0)
MCV: 92.7 fL (ref 78.0–100.0)
Platelets: 202 10*3/uL (ref 150–400)
RBC: 3.96 MIL/uL — ABNORMAL LOW (ref 4.22–5.81)
RDW: 12.3 % (ref 11.5–15.5)
WBC: 9.2 10*3/uL (ref 4.0–10.5)

## 2010-02-22 LAB — D-DIMER, QUANTITATIVE: D-Dimer, Quant: 0.68 ug/mL-FEU — ABNORMAL HIGH (ref 0.00–0.48)

## 2010-02-23 ENCOUNTER — Encounter (HOSPITAL_COMMUNITY): Payer: Self-pay | Admitting: Radiology

## 2010-02-23 ENCOUNTER — Telehealth: Payer: Self-pay | Admitting: Internal Medicine

## 2010-02-23 LAB — TSH: TSH: 3.906 u[IU]/mL (ref 0.350–4.500)

## 2010-02-23 LAB — CK TOTAL AND CKMB (NOT AT ARMC)
CK, MB: 6 ng/mL — ABNORMAL HIGH (ref 0.3–4.0)
Relative Index: INVALID (ref 0.0–2.5)
Total CK: 96 U/L (ref 7–232)

## 2010-02-23 LAB — TROPONIN I: Troponin I: 0.01 ng/mL (ref 0.00–0.06)

## 2010-02-23 LAB — HEMOGLOBIN A1C
Hgb A1c MFr Bld: 5.7 % — ABNORMAL HIGH (ref <5.7)
Mean Plasma Glucose: 117 mg/dL — ABNORMAL HIGH (ref <117)

## 2010-02-23 LAB — GLUCOSE, CAPILLARY: Glucose-Capillary: 99 mg/dL (ref 70–99)

## 2010-02-23 MED ORDER — IOHEXOL 350 MG/ML SOLN
100.0000 mL | Freq: Once | INTRAVENOUS | Status: AC | PRN
  Administered 2010-02-23: 100 mL via INTRAVENOUS

## 2010-02-24 ENCOUNTER — Encounter (HOSPITAL_COMMUNITY): Payer: Medicare Other

## 2010-02-24 LAB — COMPREHENSIVE METABOLIC PANEL
ALT: 22 U/L (ref 0–53)
AST: 29 U/L (ref 0–37)
Albumin: 3.7 g/dL (ref 3.5–5.2)
Alkaline Phosphatase: 66 U/L (ref 39–117)
BUN: 9 mg/dL (ref 6–23)
CO2: 30 mEq/L (ref 19–32)
Calcium: 9.4 mg/dL (ref 8.4–10.5)
Chloride: 102 mEq/L (ref 96–112)
Creatinine, Ser: 1.07 mg/dL (ref 0.4–1.5)
GFR calc Af Amer: >60 mL/min (ref >60)
GFR calc non Af Amer: >60 mL/min (ref >60)
Glucose, Bld: 130 mg/dL — ABNORMAL HIGH (ref 70–99)
Potassium: 4.8 mEq/L (ref 3.5–5.1)
Sodium: 139 mEq/L (ref 135–145)
Total Bilirubin: 0.8 mg/dL (ref 0.3–1.2)
Total Protein: 5.9 g/dL — ABNORMAL LOW (ref 6.0–8.3)

## 2010-02-24 LAB — GLUCOSE, CAPILLARY
Glucose-Capillary: 169 mg/dL — ABNORMAL HIGH (ref 70–99)
Glucose-Capillary: 101 mg/dL — ABNORMAL HIGH (ref 70–99)
Glucose-Capillary: 108 mg/dL — ABNORMAL HIGH (ref 70–99)
Glucose-Capillary: 137 mg/dL — ABNORMAL HIGH (ref 70–99)

## 2010-02-24 LAB — CARDIAC PANEL(CRET KIN+CKTOT+MB+TROPI)
CK, MB: 7.5 ng/mL (ref 0.3–4.0)
CK, MB: 6.7 ng/mL (ref 0.3–4.0)
Relative Index: 5.7 — ABNORMAL HIGH (ref 0.0–2.5)
Relative Index: 5.9 — ABNORMAL HIGH (ref 0.0–2.5)
Total CK: 131 U/L (ref 7–232)
Total CK: 113 U/L (ref 7–232)
Troponin I: 0.01 ng/mL (ref 0.00–0.06)
Troponin I: <0.01 ng/mL (ref 0.00–0.06)

## 2010-02-24 LAB — CBC
HCT: 34.5 % — ABNORMAL LOW (ref 39.0–52.0)
Hemoglobin: 11.8 g/dL — ABNORMAL LOW (ref 13.0–17.0)
MCH: 31.7 pg (ref 26.0–34.0)
MCHC: 34.2 g/dL (ref 30.0–36.0)
MCV: 92.7 fL (ref 78.0–100.0)
Platelets: 185 10*3/uL (ref 150–400)
RBC: 3.72 MIL/uL — ABNORMAL LOW (ref 4.22–5.81)
RDW: 12.4 % (ref 11.5–15.5)
WBC: 5.9 10*3/uL (ref 4.0–10.5)

## 2010-02-25 ENCOUNTER — Telehealth (INDEPENDENT_AMBULATORY_CARE_PROVIDER_SITE_OTHER): Payer: Self-pay | Admitting: *Deleted

## 2010-02-25 LAB — GLUCOSE, CAPILLARY
Glucose-Capillary: 139 mg/dL — ABNORMAL HIGH (ref 70–99)
Glucose-Capillary: 88 mg/dL (ref 70–99)
Glucose-Capillary: 120 mg/dL — ABNORMAL HIGH (ref 70–99)

## 2010-02-25 LAB — CARDIAC PANEL(CRET KIN+CKTOT+MB+TROPI)
CK, MB: 7.1 ng/mL (ref 0.3–4.0)
CK, MB: 5.9 ng/mL — ABNORMAL HIGH (ref 0.3–4.0)
CK, MB: 7.1 ng/mL (ref 0.3–4.0)
Relative Index: 5.7 — ABNORMAL HIGH (ref 0.0–2.5)
Relative Index: 5.6 — ABNORMAL HIGH (ref 0.0–2.5)
Relative Index: 5.9 — ABNORMAL HIGH (ref 0.0–2.5)
Total CK: 125 U/L (ref 7–232)
Total CK: 106 U/L (ref 7–232)
Total CK: 121 U/L (ref 7–232)
Troponin I: <0.01 ng/mL (ref 0.00–0.06)
Troponin I: <0.01 ng/mL (ref 0.00–0.06)
Troponin I: 0.01 ng/mL (ref 0.00–0.06)

## 2010-03-01 ENCOUNTER — Encounter (HOSPITAL_COMMUNITY): Payer: Medicare Other

## 2010-03-02 ENCOUNTER — Telehealth (INDEPENDENT_AMBULATORY_CARE_PROVIDER_SITE_OTHER): Payer: Self-pay | Admitting: *Deleted

## 2010-03-02 NOTE — Progress Notes (Signed)
Summary: [facility] admitted [facility]  Phone Note Call from Patient   Caller: Daughter Alysa Call For: Julissa Browning Summary of Call: daughter wanted dr Jenasia Dolinar to know that pt [facility] - room 6735- for SOB. she was also concerned re: blood thinner meds. i advised her to address any concerns with nurse at hospital. daughter's cell [phone] Initial call taken by: Tivis Ringer, CNA,  February 23, 2010 4:08 PM  Follow-up for Phone Call        Spoke with pts daughter-aware that I will send this message to MW as an Lorain Childes and to have hospital address any concerns or questions while patient is admitted.Reynaldo Minium CMA  February 23, 2010 4:21 PM  aware Follow-up by: Nyoka Cowden MD,  February 24, 2010 9:03 AM

## 2010-03-02 NOTE — Progress Notes (Signed)
Summary: patient sob is worse and now he has diaherrheaLMTCBx1  Phone Note Call from Patient Call back at Home Phone [phone]   Caller: Patient Call For: WERT Summary of Call: Patient was seen on Friday and he doesnt think that he is any better he has now developed diarrhea this morning and his shortness of breath seems to be getting worse instead of better. Patient can be reached at [phone] Initial call taken by: Vedia Coffer,  February 21, 2010 2:58 PM  Follow-up for Phone Call        PT CALLED BACK AGAIN RE: SAME. DIDN'T WANT "TO BE FORGOTTEN TODAY". Tivis Ringer, CNA  February 21, 2010 4:50 PM  LMTCBx1. Carron Curie CMA  February 21, 2010 5:25 PM   Additional Follow-up for Phone Call Additional follow up Details #1::        Pt has 2 days ieft of Doxy and is in middle of Pred taper.  Stil c/o prod cough(green) and SOB with activity.  Diarrhea has now stopped.  Encouraged pt to finish abx, increase fluids, and get plenty of rest and if not better by end of week call back and we would re evaluate his symptoms. Abigail Miyamoto RN  February 22, 2010 12:31 PM

## 2010-03-02 NOTE — Assessment & Plan Note (Signed)
Summary: Acute NP office visit - bronchitis   Primary Provider/Referring Provider:  [name]  CC:  prod cough with green/white mucus, wheezing, and SOB x5days.  History of Present Illness: 77  yowm last smoked 1983  severe COPD  FEV1 of only 36% April 2007 with a 29% improvement after bronchodilators consistent with an asthmatic component.   Baseline activity level--walk 1 mile daily with oxygen on. Maintained on Symbicort 160/4.53mcg 1 puff two times a day.    February 11, 2008 ov never took spiriva consistently.  No variability to doe or sign cough. rec spiriva but w/in one week cp > M[facility] with neg cardiac wu, still having occ cp , positional, better with rubbing chest and relaxing.  March 26, 2008 doe x 30 min mile on 02, in retrospect better on spiriva.no cough. no itching sneezing or wheezing. Try spiriva every other for several weeks then take daily, think of it like high octane fuel for your car - stop spiriva if you don't feel it's helping you ex performance or if you have any side effects Please schedule a follow-up appointment in 3 months > never took   June 30, 2008 ov no longer walking on 02, does walk to shed and back without 02 and without stopping but confused with instructions on how to take rescue rx, having variable doe minimal cough no noct c/o's.  rec trial of spiriva.  October 15, 2008 rov follow up, about the same  did not get along well with spiriva , discont. taking caused dry mouth. still definitely benefits from as needed xopenex.    November 19, 2008--Presents for follow up. was switched from metoprolol to bystolic at last OV.  states was feeling tired, "beat up" and lethargic so began the metoprolol again  but did not feel better.  would like to know if he may switch back to the bystolic , and also the spiriva. to help with breathing. rec resume spiriva > stopped due to urinary retention while on a trip to [facility]  May 03, 2009    c/o breathing worsening over the  past 2-3 wk, gets SOB with any activity.  He also c/o leg swelling getting worse- lasix is not helping and he c/o low BP when uses lasix. no cough.. better on 02, p saba  page 2       August 19, 2009 Acute visit.  Pt c/o wheezing and cough x 5 days. Cough is prod with frothy white sputum.  He also c/o increased SOB.  He has been using o2 all the time since symptoms started.   comfortable at rest on room air and ok lying down on 2 lpm but otherwise sob.  no cp. no purulent sputum.  mod nasal congestion ? related to 02.    September 02, 2009 --Presents for follow up and med reivew. Unfortunately  pt did not bring not any meds with him today.   He was given rechallenged with Spriiva last vist. He does not have any nocticeable improvement in breathing on Spriva and it causes worsening of urinary problems/dribbling. We reviewed his med list on MAR. Continue Symbicort 2 puffs two times a day  stop Spiriva > urinary symptoms resolved   November 04, 2009 ov cc doe x 1 month rx with nebulizer by Doctor in [place] with abrupt onset 10/25 of yellow mucus and gen chest tightness with coughing but nothing lateralizing, mild nasal congestion and watery nasal discharge also.  confused with meds, did not bring  calendar. >>rec to finish zpack and take steroid pack.  11/22/09--Presents for a follow up and med review.  Had bronchitic flare last ov , was given a steroid taper. He is feeling better and is back to his baseline. We reviewed all his meds and organized them into a med calendar. Denies chest pain,  orthopnea, hemoptysis, fever, n/v/d, edema, headache.   February 18, 2010 --Presents for an acute office visit. Complains of prod cough with green/white mucus, wheezing, SOB x5days. OTC not helping. Cough is worse at night, keeping him up at times. Wheezing started yesterday. Denies chest pain, orthopnea, hemoptysis, fever, n/v/d, edema, headache.  Medications Prior to Update: 1)  Toprol Xl 25 Mg Xr24h-Tab  (Metoprolol Succinate) .Marland Kitchen.. 1 Once Daily 2)  Lipitor 20 Mg  Tabs (Atorvastatin Calcium) .... Take 1 Tab By Mouth At Bedtime 3)  Multivitamins   Tabs (Multiple Vitamin) .... Once Daily 4)  Ascriptin 325 Mg Tabs (Aspirin Buf(Alhyd-Mghyd-Cacar)) .... Once Daily 5)  Vitamin D3 1000 Unit Tabs (Cholecalciferol) .... Take 1 Tablet By Mouth Once A Day 6)  Symbicort 160-4.5 Mcg/act  Aero (Budesonide-Formoterol Fumarate) .... 2 Puffs First Thing  in Am and 2 Puffs Again in Pm About 12 Hours Later 7)  Ipratropium Bromide 0.02 % Soln (Ipratropium Bromide) .... Inhale 1 Vial Via Hhn Two Times A Day 8)  Xopenex 1.25 Mg/80ml Nebu (Levalbuterol Hcl) .... Inhale 1 Vial Via Hhn Two Times A Day 9)  Oxygen 2l/min .... Wear At Bedtime 10)  Claritin 10 Mg Tabs (Loratadine) .... Take 1 Tablet By Mouth Once A Day As Needed 11)  Furosemide 20 Mg Tabs (Furosemide) .Marland Kitchen.. 1 By Mouth Once Daily As Needed Leg Swelling 12)  Mucinex 600 Mg Xr12h-Tab (Guaifenesin) .... Take 1-2 Tablets Every 12 Hours As Needed 13)  Xopenex Hfa 45 Mcg/act Aero (Levalbuterol Tartrate) .... 2 Every 4 Hours If Needed 14)  Xopenex 1.25 Mg/61ml Nebu (Levalbuterol Hcl) .... Inhale 1 Vial Via Hhn Every 4 Hours As Needed 15)  Oxygen 2l/min .... Wear As Needed For Shortness of Breath 16)  Zantac 150 Mg Tabs (Ranitidine Hcl) .... Per Box As Needed 17)  Nebulizer Machine of Choice .... Use As Directed For Breathing Treatments 18)  Diovan 160 Mg Tabs (Valsartan) .Marland Kitchen.. 1 By Mouth Daily  Current Medications (verified): 1)  Toprol Xl 25 Mg Xr24h-Tab (Metoprolol Succinate) .Marland Kitchen.. 1 Once Daily 2)  Lipitor 20 Mg  Tabs (Atorvastatin Calcium) .... Take 1 Tab By Mouth At Bedtime 3)  Multivitamins   Tabs (Multiple Vitamin) .... Once Daily 4)  Ascriptin 325 Mg Tabs (Aspirin Buf(Alhyd-Mghyd-Cacar)) .... Once Daily 5)  Vitamin D3 1000 Unit Tabs (Cholecalciferol) .... Take 1 Tablet By Mouth Once A Day 6)  Symbicort 160-4.5 Mcg/act  Aero (Budesonide-Formoterol Fumarate)  .... 2 Puffs First Thing  in Am and 2 Puffs Again in Pm About 12 Hours Later 7)  Ipratropium Bromide 0.02 % Soln (Ipratropium Bromide) .... Inhale 1 Vial Via Hhn Two Times A Day 8)  Xopenex 1.25 Mg/73ml Nebu (Levalbuterol Hcl) .... Inhale 1 Vial Via Hhn Two Times A Day 9)  Oxygen 2l/min .... Wear At Bedtime 10)  Claritin 10 Mg Tabs (Loratadine) .... Take 1 Tablet By Mouth Once A Day As Needed 11)  Furosemide 20 Mg Tabs (Furosemide) .Marland Kitchen.. 1 By Mouth Once Daily As Needed Leg Swelling 12)  Mucinex 600 Mg Xr12h-Tab (Guaifenesin) .... Take 1-2 Tablets Every 12 Hours As Needed 13)  Xopenex Hfa 45 Mcg/act Aero (Levalbuterol Tartrate) .... 2 Every  4 Hours If Needed 14)  Xopenex 1.25 Mg/37ml Nebu (Levalbuterol Hcl) .... Inhale 1 Vial Via Hhn Every 4 Hours As Needed 15)  Oxygen 2l/min .... Wear As Needed For Shortness of Breath 16)  Zantac 150 Mg Tabs (Ranitidine Hcl) .... Per Box As Needed 17)  Nebulizer Machine of Choice .... Use As Directed For Breathing Treatments 18)  Diovan 160 Mg Tabs (Valsartan) .Marland Kitchen.. 1 By Mouth Daily 19)  Nasonex 50 Mcg/act Susp (Mometasone Furoate) .Marland Kitchen.. 1 Spray Each Nostril Two Times A Day As Needed  Allergies (verified): 1)  ! Sulfa 2)  ! Truman Hayward  Past History:  Past Medical History: Last updated: 11/22/2009 CHRONIC RHINITIS (ICD-472.0) CHRONIC RESPIRATORY FAILURE (ICD-518.83)      - 02 rx since 2008 COPD UNSPECIFIED (ICD-496)....................................................................[name]   - PFTs 05/04/05 FEV1 36% ratio 28% with 29% improvement after bronchodilators    DLCO 48%   - PFT's 03/26/08          30% ratio 34      with 14% improvement after bronchodilators  DLC0 38 %   - Start noct 02 at 2lpm 2008 and on 03/04/09 desat @ > 185 ft so rec wear with activtiy > rm to rm   - [facility] June 30, 2008 100% > confirmed March 04, 2009  ISCHEMIC HEART DISEASE (ICD-414.9) HYPERTENSION (ICD-401.9) HYPERLIPIDEMIA (ICD-272.4) SPN RUL by cxr August 19, 2009  COMPLEX  MED REGIMEN-Meds reviewed with pt education and computerized med calendar 11/22/09     Review of Systems      See HPI  Vital Signs:  Patient profile:   77 year old male Height:      69 inches Weight:      159.19 pounds BMI:     23.59 O2 Sat:      93 % on Room air Temp:     97.5 degrees F oral Pulse rate:   89 / minute BP sitting:   112 / 56  (left arm) Cuff size:   regular  Vitals Entered By: Boone Master CNA/MA (February 18, 2010 12:19 PM)  O2 Flow:  Room air  Physical Exam  Additional Exam:  chronically ill but ambulatory elderly white male in no acute distress wt  159 October 01, 2007  > 159 March 04, 2009>    > 158 August 19, 2009 >>159 November 04, 2009 >159 2/101/2 HEENT mild turbinate edema.  Oropharynx no thrush or excess pnd or cobblestoning.  No JVD or cervical adenopathy. Mild accessory muscle hypertrophy. Trachea midline, nl thryroid. Chest : coarse BS w/ few exp wheezes .  Regular rate and rhythm without murmur gallop or rub or increase P2. Decrease s1s2 Abd: no hsm, nl excursion. Ext warm without cyanosis or clubbing,  trace bilateral  edema    Impression & Recommendations:  Problem # 1:  COPD UNSPECIFIED (ICD-496)  Exacerbation Plan:  Doxcycline 100mg  two times a day for 7days with food  Mucinex DM two times a day as needed cough/congestion Prednisone taper over  next week. increase fluids and rest  Please contact office for sooner follow up if symptoms do not improve or worsen  follow up Dr. Sherene Sires in 4-6 weeks  and as needed   Orders: Est. Patient Level IV ([address]109)  Medications Added to Medication List This Visit: 1)  Nasonex 50 Mcg/act Susp (Mometasone furoate) .Marland Kitchen.. 1 spray each nostril two times a day as needed 2)  Doxycycline Hyclate 100 Mg Caps (Doxycycline hyclate) .Marland Kitchen.. 1 by mouth two times a day  3)  Prednisone 10 Mg Tabs (Prednisone) .... 4 tabs for 2 days, then 3 tabs for 2 days, 2 tabs for 2 days, then 1 tab for 2 days, then stop  Patient  Instructions: 1)  Doxcycline 100mg  two times a day for 7days with food  2)  Mucinex DM two times a day as needed cough/congestion 3)  Prednisone taper over  next week. 4)  increase fluids and rest  5)  Please contact office for sooner follow up if symptoms do not improve or worsen  6)  follow up Dr. Sherene Sires in 4-6 weeks  and as needed  Prescriptions: PREDNISONE 10 MG TABS (PREDNISONE) 4 tabs for 2 days, then 3 tabs for 2 days, 2 tabs for 2 days, then 1 tab for 2 days, then stop  #20 x 0   Entered and Authorized by:   Rubye Oaks NP   Signed by:   Rubye Oaks NP on 02/18/2010   Method used:   Electronically to        [facility]. [phone]* (retail)       [address]       [place], [place]  [address]       Ph: [phone]       Fax: [phone]   RxID:   [id number] DOXYCYCLINE HYCLATE 100 MG CAPS (DOXYCYCLINE HYCLATE) 1 by mouth two times a day  #14 x 0   Entered and Authorized by:   Rubye Oaks NP   Signed by:   Rubye Oaks NP on 02/18/2010   Method used:   Electronically to        [facility]. [phone]* (retail)       [address]       [place], [place]  [address]       Ph: [phone]       Fax: [phone]   RxID:   [id number]    Immunization History:  Influenza Immunization History:    Influenza:  historical (11/09/2009)

## 2010-03-03 ENCOUNTER — Encounter: Payer: Self-pay | Admitting: Internal Medicine

## 2010-03-03 ENCOUNTER — Encounter (HOSPITAL_COMMUNITY): Payer: Medicare Other

## 2010-03-03 ENCOUNTER — Ambulatory Visit (INDEPENDENT_AMBULATORY_CARE_PROVIDER_SITE_OTHER)
Admission: RE | Admit: 2010-03-03 | Discharge: 2010-03-03 | Disposition: A | Discharge status: Home / Self Care | Payer: Medicare Other | Source: Ambulatory Visit | Attending: Internal Medicine | Admitting: Internal Medicine

## 2010-03-03 ENCOUNTER — Ambulatory Visit (INDEPENDENT_AMBULATORY_CARE_PROVIDER_SITE_OTHER): Payer: Medicare Other | Admitting: Internal Medicine

## 2010-03-03 ENCOUNTER — Other Ambulatory Visit: Payer: Self-pay | Admitting: Internal Medicine

## 2010-03-03 DIAGNOSIS — R05 Cough: Secondary | ICD-10-CM

## 2010-03-03 DIAGNOSIS — J449 Chronic obstructive pulmonary disease, unspecified: Secondary | ICD-10-CM

## 2010-03-03 DIAGNOSIS — R059 Cough, unspecified: Secondary | ICD-10-CM

## 2010-03-07 NOTE — H&P (Signed)
NAME:  [name], [name] NO.:  [id number]  MEDICAL RECORD NO.:  [id number]           PATIENT TYPE:  E  LOCATION:  [facility]                         FACILITY:  [facility]  PHYSICIAN:  Eduard Clos, MDDATE OF BIRTH:  1929-06-28  DATE OF ADMISSION:  02/22/2010 DATE OF DISCHARGE:                             HISTORY & PHYSICAL   PRIMARY CARE PHYSICIAN:  At [place] at [facility].  PRIMARY PULMONOLOGIST:  Charlaine Dalton. Sherene Sires, MD, FCCP of [place].  CHIEF COMPLAINT:  Shortness of breath.  HISTORY OF PRESENT ILLNESS:  A 77 year old male with known history of COPD on home oxygen, coronary artery disease status post CABG, who presents with complaint of shortness of breath.  The patient has been having shortness of breath over the last 1 week which is worsening.  The patient did go to the pulmonologist who had given prednisone and antibiotics, doxycycline, despite taking which, shortness of breath had still worsened.  Denies any fever or chills.  Has been having some cough with productive sputum.  Denies any nausea, vomiting, or dizziness.  Has some pleuritic type of chest pain.  In the ER, the patient had a D-dimer which is high.  CT angio chest was done which is negative for anything acute.  The patient is admitted for COPD exacerbation.  The patient denies any abdominal pain, dysuria, discharge, dizziness, loss of conscious, or any focal deficit.  PAST MEDICAL HISTORY: 1. History of COPD on home oxygen. 2. Coronary artery disease status post CABG in 2003. 3. Carotid artery stenosis. 4. Peripheral artery disease.  ALLERGIES:  SULFA.  MEDICATIONS PRIOR TO ADMISSION:  The patient is on: 1. Aspirin 325 p.o. daily. 2. Recently started on prednisone and doxycycline. 3. Nasonex nasal spray. 4. Zantac 150 mg daily. 5. Xopenex nebulizer. 6. Atrovent nebulizer. 7. Furosemide 20 mg daily/ 8. Claritin 10 mg p.o. daily. 9. Symbicort 160/4.5 two puffs twice daily.  FAMILY  HISTORY:  Significant for coronary artery disease in his father and mother having breast cancer.  SOCIAL HISTORY:  The patient is retired, is married.  Quit smoking in 1983.  Drinks wine every day.  Denies any drug abuse.  REVIEW OF SYSTEMS:  As stated in history of present illness, nothing else significant.  PHYSICAL EXAMINATION:  GENERAL:  The patient examined at bedside, not in acute distress. VITAL SIGNS:  Blood pressure is 136/78, pulse is 69 per minute, temperature 98.1, respiration 20 per minute, O2 sat is 97%. HEENT:  Anicteric.  No pallor.  No discharge from ears, eyes, nose, or mouth. CHEST:  Bilateral entry present.  Bilaterally expiratory wheeze heard. No crepitation. HEART:  S1, S2 heard. ABDOMEN:  Soft, nontender.  Bowel sounds heard. CNS:  Alert, awake, oriented to time, place, and person.  Moves upper and lower extremities 5/5. EXTREMITIES:  Peripheral pulses felt.  No edema.  LABORATORY DATA:  Chest x-ray shows no evidence of pneumonia.  CT angio chest shows negative for pulmonary embolism, no acute disease, emphysema.  CBC; WBC is 9.2, hemoglobin is 12.9, hematocrit is 36.7, platelets 202.  D-dimer is 0.68.  Basic metabolic panel; sodium  130, potassium 4.2, chloride 95, carbon dioxide 26, glucose 138, BUN 30, creatinine 1, calcium 9.5.  ASSESSMENT: 1. Chronic obstructive pulmonary disease exacerbation. 2. Pleuritic type of chest pain. 3. History of coronary artery disease status post coronary artery     bypass graft. 4. History of carotid artery stenosis. 5. Previous history of cigarette smoking, quit in 1983. 6. History of hyperlipidemia. 7. History of hypertension.  PLAN: 1. At this time, we will admit the patient to telemetry. 2. For his COPD, the patient will be on nebulizer, antibiotics, and     steroids. 3. For his pleuritic type of chest pain, we will cycle cardiac     markers, get EKG. 4. For his hypertension, we will continue his present  medication. 5. Further recommendation as condition evolves.     Eduard Clos, MD     ANK/MEDQ  D:  02/23/2010  T:  02/23/2010  Job:  [id number]  cc:   Trude Mcburney B. Sherene Sires, MD, [facility]  Electronically Signed by Midge Minium MD on 03/07/2010 04:57:20 PM

## 2010-03-08 ENCOUNTER — Encounter (HOSPITAL_COMMUNITY): Payer: Medicare Other

## 2010-03-08 ENCOUNTER — Telehealth: Payer: Self-pay | Admitting: Internal Medicine

## 2010-03-08 NOTE — Progress Notes (Signed)
Summary: patient wants to know if he needs appt or if script can be calle  Phone Note Call from Patient Call back at Home Phone [phone]   Caller: Patient Call For: wert Summary of Call: patient phoned stated that he was just discharged on last Friday from [facility] and his mucas cleared up while he was in the hospital but now he is coughing up a yellowish mucas and also has a yellowish nasal discharge. He wants to know if he needs to be seen or if an antibiotic can be called into [facility] at [address] and [address]. He also has only one day left on his prednison taper. He just doesnt want to end up back in the hospital. Patient can be reached at [phone] Initial call taken by: Vedia Coffer,  March 02, 2010 2:53 PM  Follow-up for Phone Call        Spoke with pt.  He states that his cough never got better, now worse with large amounts of yellow sputum.  OV with [facility] sched for 1:30 pm tommorrow.  Follow-up by: [name],  March 02, 2010 4:05 PM

## 2010-03-08 NOTE — Discharge Summary (Signed)
NAME:  [name], [name] NO.:  [id number]  MEDICAL RECORD NO.:  [id number]           PATIENT TYPE:  I  LOCATION:  6736                         FACILITY:  [facility]  PHYSICIAN:  Erick Blinks, MD     DATE OF BIRTH:  August 10, 1929  DATE OF ADMISSION:  02/22/2010 DATE OF DISCHARGE:  02/25/2010                              DISCHARGE SUMMARY   PRIMARY CARDIOLOGIST:  Lyn Records, M.D.  PULMONOLOGIST:  Charlaine Dalton. Sherene Sires, MD, FCCP.  PRIMARY CARE PHYSICIAN:  Dr. Simone Curia.  DISCHARGE DIAGNOSES: 1. Acute on chronic respiratory failure, improving. 2. Exacerbation of chronic obstructive pulmonary disease. 3. Atypical chest pain. 4. History of coronary artery disease status post coronary artery     bypass graft. 5. Carotid artery stenosis. 6. Peripheral arterial disease.  DISCHARGE MEDICATIONS: 1. Prednisone taper. 2. Azithromycin 500 mg p.o. daily. 3. Nitroglycerin 0.4 mg tablets sublingual q.5 minutes p.r.n. x3 for     chest pain. 4. Toprol XL 25 mg 1 tablet p.o. daily. 5. Lipitor 20 mg 1 tablet p.o. at bedtime. 6. Multivitamins therapeutic 1 tablet p.o. daily. 7. Atacand 8 mg 2 tablets p.o. daily. 8. Aspirin 325 mg p.o. daily. 9. Vitamin D3, 1000 units 1 tablet p.o. daily. 10.Symbicort 160/4.5 mcg 2 puffs inhaled b.i.d. 11.Ipratropium nebulization one vial inhaled q.i.d. 12.Xopenex nebulization 1.25 mg one vial inhaled q.i.d. and q.4 h.     p.r.n. 13.Claritin 10 mg p.o. daily. 14.Lasix 20 mg p.o. daily p.r.n. for leg swelling. 15.Mucinex 600 mg 1-2 tablets p.o. q.12 h. p.r.n. 16.Xopenex inhaler 1 puff inhaled q.4 h. p.r.n. 17.Zantac 150 mg p.o. daily p.r.n. 18.Nasonex 50 mcg 1 puff nasal b.i.d. p.r.n.  ADMISSION HISTORY:  This is a 77 year old male with history of coronary artery disease who presented to the emergency room with complaints of shortness of breath.  The patient was having shortness of breath for 1 week prior to admission, which was progressively  worsening.  He had seen his pulmonologist and was given a course of prednisone as well as doxycycline, which did not help with symptoms.  His symptoms continued to get worse, and he presented to the ER.  He also had some pleuritic- type chest pain.  The patient was subsequently admitted for COPD exacerbation.  For further details, please refer to the history and physical dictated by Dr. Toniann Fail on February 15.  HOSPITAL COURSE: 1. Acute exacerbation of COPD.  The patient was placed on steroids as     well as scheduled bronchodilators.  He was placed on incentive     spirometry, mucolytics, and continued on oxygen therapy.  His     breathing has significantly improved.  He still desaturates on     ambulation, but maintain his saturations in the low 90s at rest.     He has already been using p.r.n. oxygen at home as well as at     night.  We will continue with continuous oxygen for now until he     sees his pulmonologist in followup next week.  He will be placed on     a prednisone taper.  He is also on azithromycin for antibiotic     coverage. 2. Chest pain.  The patient has had atypical chest pain since his     arrival.  All of his EKGs and cardiac enzymes have been negative.     He has also had a negative CT scan to rule out pulmonary embolism.     Due to his significant cardiac history, the case was discussed with     his primary cardiologist, Dr. Katrinka Blazing, and it was felt appropriate to     discharge the patient and have him follow up with Dr. Katrinka Blazing in the     office. 3. Hypertension.  The patient has had uncontrolled hypertension since     he has been here in the hospital.  We have increased his Atacand,     and further titration can be done as an outpatient.  PROCEDURES:  None.  DIAGNOSTIC IMAGING: 1. Chest x-ray from February 14 shows no evidence of pneumonia. 2. CT angio of the chest on February 15 shows negative pulmonary     embolism, no acute disease or  emphysema.  DISCHARGE INSTRUCTIONS:  The patient should continue on a low-salt diet. He should conduct his activity as tolerated and continue on continuous oxygen at 2 L/min.  He will need to follow up with Dr. Sherene Sires next week as well as Dr. Katrinka Blazing next week.  He should follow up with his primary care physician as previously scheduled.     Erick Blinks, MD     JM/MEDQ  D:  02/25/2010  T:  02/26/2010  Job:  [id number]  cc:   Lyn Records, M.D. Charlaine Dalton. Sherene Sires, MD, FCCP Simone Curia  Electronically Signed by Erick Blinks  on 03/08/2010 05:21:34 PM

## 2010-03-08 NOTE — Progress Notes (Signed)
Summary: hosp discharge pending - appt scheduled  Phone Note Call from Patient Call back at Home Phone [phone]   Caller: Patient Call For: wert Summary of Call: pt is going to be discharged from hospital today (although he hasn't been yet). he wants to know when he needs to have a HFU w/ dr wert. pt doesn't need a call back now. says someone can call him monday at his home.  Initial call taken by: Tivis Ringer, CNA,  February 25, 2010 2:34 PM  Follow-up for Phone Call        time frame for hosp discharge appt will be discussed when dc'd from hosp.  however, per echart pt has already been discharged.  was f/u discussed? (hosp discharg not dictated in echart)  LMOM TCB x1. Boone Master CNA/MA  February 25, 2010 5:25 PM   Additional Follow-up for Phone Call Additional follow up Details #1::        Pt is sch for HFU w/ MW on 03/17/2010. He was instructed to cont all meds as RX'd at discharge and to call if he has any problems or questions before appt date. Additional Follow-up by: Michel Bickers CMA,  February 28, 2010 1:59 PM

## 2010-03-08 NOTE — Assessment & Plan Note (Signed)
Summary: Pulmonary/ ext acute ov for refractory bronchitis   Primary Provider/Referring Provider:  [name]  CC:  Cough and dyspnea- worse.  History of Present Illness: 77  yowm last smoked 1983  severe COPD  FEV1 of only 36% April 2007 with a 29% improvement after bronchodilators consistent with an asthmatic component.   Baseline activity level--walk 1 mile daily with oxygen on. Maintained on Symbicort 160/4.60mcg 1 puff two times a day.    February 11, 2008 ov never took spiriva consistently.  No variability to doe or sign cough. rec spiriva but w/in one week cp > MCH with neg cardiac wu, still having occ cp , positional, better with rubbing chest and relaxing.  March 26, 2008 doe x 30 min mile on 02, in retrospect better on spiriva.no cough. no itching sneezing or wheezing. Try spiriva every other for several weeks then take daily, think of it like high octane fuel for your car - stop spiriva if you don't feel it's helping you ex performance or if you have any side effects Please schedule a follow-up appointment in 3 months > never took   June 30, 2008 ov no longer walking on 02, does walk to shed and back without 02 and without stopping but confused with instructions on how to take rescue rx, having variable doe minimal cough no noct c/o's.  rec trial of spiriva.  October 15, 2008 rov follow up, about the same  did not get along well with spiriva , discont. taking caused dry mouth. still definitely benefits from as needed xopenex.    November 19, 2008--Presents for follow up. was switched from metoprolol to bystolic at last OV.  states was feeling tired, "beat up" and lethargic so began the metoprolol again  but did not feel better.  would like to know if he may switch back to the bystolic , and also the spiriva. to help with breathing. rec resume spiriva > stopped due to urinary retention while on a trip to [facility]  May 03, 2009    c/o breathing worsening over the past 2-3 wk, gets SOB  with any activity.  He also c/o leg swelling getting worse- lasix is not helping and he c/o low BP when uses lasix. no cough.. better on 02, p saba  page 2       August 19, 2009 Acute visit.  Pt c/o wheezing and cough x 5 days. Cough is prod with frothy white sputum.  He also c/o increased SOB.  He has been using o2 all the time since symptoms started.   comfortable at rest on room air and ok lying down on 2 lpm but otherwise sob.  no cp. no purulent sputum.  mod nasal congestion ? related to 02.    September 02, 2009 --Presents for follow up and med reivew. Unfortunately  pt did not bring not any meds with him today.   He was given rechallenged with Spriiva last vist. He does not have any nocticeable improvement in breathing on Spriva and it causes worsening of urinary problems/dribbling. We reviewed his med list on MAR. Continue Symbicort 2 puffs two times a day  stop Spiriva > urinary symptoms resolved   November 04, 2009 ov cc doe x 1 month rx with nebulizer by Doctor in [place] with abrupt onset 10/25 of yellow mucus and gen chest tightness with coughing but nothing lateralizing, mild nasal congestion and watery nasal discharge also.  confused with meds, did not bring calendar. >>rec to finish  zpack and take steroid pack.  11/22/09--Presents for a follow up and med review.  Had bronchitic flare last ov , was given a steroid taper. He is feeling better and is back to his baseline. We reviewed all his meds and organized them into a med calendar. Denies chest pain,  orthopnea, hemoptysis, fever, n/v/d, edema, headache.   February 18, 2010 -- ov cc cough with green/white mucus, wheezing, SOB x5days. OTC not helping. Cough is worse at night, keeping him up at times. Wheezing started yesterday.  rex doxy, prednisone then admit 2/14 to [facility] and d/c 2/17 with ct c/w emphysema > d/c on zmax and prednisone tapered off as of March 03, 2010   March 03, 2010 ov cc minimally improved sob and cough with  green sputum and nasal congestion esp in am .. doex > slow adls  Pt denies any significant sore throat, dysphagia, itching, sneezing,  nasal congestion or excess secretions,  fever, chills, sweats, unintended wt loss, pleuritic or exertional cp, hempoptysis, change in activity tolerance  orthopnea pnd or leg swelling Pt also denies any obvious fluctuation in symptoms with weather or environmental change or other alleviating or aggravating factors.     minimally better p nebs  Current Medications (verified): 1)  Toprol Xl 25 Mg Xr24h-Tab (Metoprolol Succinate) .Marland Kitchen.. 1 Once Daily 2)  Lipitor 20 Mg  Tabs (Atorvastatin Calcium) .... Take 1 Tab By Mouth At Bedtime 3)  Multivitamins   Tabs (Multiple Vitamin) .... Once Daily 4)  Ascriptin 325 Mg Tabs (Aspirin Buf(Alhyd-Mghyd-Cacar)) .... Once Daily 5)  Vitamin D3 1000 Unit Tabs (Cholecalciferol) .... Take 1 Tablet By Mouth Once A Day 6)  Symbicort 160-4.5 Mcg/act  Aero (Budesonide-Formoterol Fumarate) .... 2 Puffs First Thing  in Am and 2 Puffs Again in Pm About 12 Hours Later 7)  Ipratropium Bromide 0.02 % Soln (Ipratropium Bromide) .... Inhale 1 Vial Via Hhn Two Times A Day 8)  Xopenex 1.25 Mg/53ml Nebu (Levalbuterol Hcl) .... Inhale 1 Vial Via Hhn Two Times A Day 9)  Oxygen 2l/min .... Wear At Bedtime 10)  Claritin 10 Mg Tabs (Loratadine) .... Take 1 Tablet By Mouth Once A Day As Needed 11)  Furosemide 20 Mg Tabs (Furosemide) .Marland Kitchen.. 1 By Mouth Once Daily As Needed Leg Swelling 12)  Mucinex 600 Mg Xr12h-Tab (Guaifenesin) .... Take 1-2 Tablets Every 12 Hours As Needed 13)  Xopenex Hfa 45 Mcg/act Aero (Levalbuterol Tartrate) .... 2 Every 4 Hours If Needed 14)  Xopenex 1.25 Mg/46ml Nebu (Levalbuterol Hcl) .... Inhale 1 Vial Via Hhn Every 4 Hours As Needed 15)  Oxygen 2l/min .... Wear As Needed For Shortness of Breath 16)  Zantac 150 Mg Tabs (Ranitidine Hcl) .... Per Box As Needed 17)  Nebulizer Machine of Choice .... Use As Directed For Breathing  Treatments 18)  Diovan 160 Mg Tabs (Valsartan) .Marland Kitchen.. 1 By Mouth Daily 19)  Nasonex 50 Mcg/act Susp (Mometasone Furoate) .Marland Kitchen.. 1 Spray Each Nostril Two Times A Day As Needed  Allergies (verified): 1)  ! Sulfa 2)  ! Truman Hayward  Past History:  Past Medical History: CHRONIC RHINITIS (ICD-472.0)     - Sinus ct March 03, 2010 >  thickening only, no acute changes CHRONIC RESPIRATORY FAILURE (ICD-518.83)      - 02 rx since 2008 COPD UNSPECIFIED (ICD-496)....................................................................[name]   - PFTs 05/04/05 FEV1 36% ratio 28% with 29% improvement after bronchodilators    DLCO 48%   - PFT's 03/26/08  30% ratio 34      with 14% improvement after bronchodilators  DLC0 38 %   - Start noct 02 at 2lpm 2008 and on 03/04/09 desat @ > 185 ft so rec wear with activtiy > rm to rm   - [facility] June 30, 2008 100% > confirmed March 04, 2009  ISCHEMIC HEART DISEASE (ICD-414.9) HYPERTENSION (ICD-401.9) HYPERLIPIDEMIA (ICD-272.4) SPN RUL by cxr August 19, 2009  COMPLEX MED REGIMEN-Meds reviewed with pt education and computerized med calendar 11/22/09     Vital Signs:  Patient profile:   77 year old male Weight:      157.38 pounds O2 Sat:      94 % on Room air Temp:     97.5 degrees F oral Pulse rate:   105 / minute BP sitting:   138 / 72  (left arm)  Vitals Entered By: [name] (March 03, 2010 1:46 PM)  O2 Flow:  Room air  Physical Exam  Additional Exam:  chronically ill but ambulatory elderly white male in no acute distress wt  159 October 01, 2007  >  159 November 04, 2009 > 157 March 03, 2010  HEENT moderate turbinate edema with mp secretions R > L .  Oropharynx no thrush or excess pnd or cobblestoning.  No JVD or cervical adenopathy. Mild accessory muscle hypertrophy. Trachea midline, nl thryroid. Chest : coarse BS w/ few exp wheezes .  Regular rate and rhythm without murmur gallop or rub or increase P2. Decrease s1s2 Abd: no hsm, nl  excursion. Ext warm without cyanosis or clubbing,  trace bilateral  edema    Impression & Recommendations:  Problem # 1:  COPD UNSPECIFIED (ICD-496)   DDX of  difficult airways managment all start with A and  include Adherence, Ace Inhibitors, Acid Reflux, Active Sinus Disease, Alpha 1 Antitripsin deficiency, Anxiety masquerading as Airways dz,  ABPA,  allergy(esp in young), Aspiration (esp in elderly), Adverse effects of DPI,  Active smokers, plus two Bs  = Bronchiectasis and Beta blocker use..and one C= CHF    Adherence seems very good    Active sinusitis:  CT shows thickening only but would benefit from at least 10 days of augmentin plus continued aggressive nasal hygiene, reviewed  Acid reflux:  See instructions for specific recommendations    Each maintenance medication was reviewed in detail including most importantly the difference between maintenance and as needed and under what circumstances the prns are to be used. See instructions for specific recommendations   Medications Added to Medication List This Visit: 1)  Augmentin 875-125 Mg Tabs (Amoxicillin-pot clavulanate) .... By mouth twice daily with large glass of water 2)  Prednisone 10 Mg Tabs (Prednisone) .... 2 each am until better then 1 daily with breakfast 3)  Prilosec Otc 20 Mg Tbec (Omeprazole magnesium) .... Take  one 30-60 min before first meal of the day  Other Orders: Prescription Created Electronically [phone]) Est. Patient Level IV (62130) Misc. Referral (Misc. Ref)  Patient Instructions: 1)  See Patient Care Coordinator before leaving for sinus CT 2)  Prilosec otc 20mg  30 min before bfast and zantac 150  at bedtime as long as coughing ( reflux is to cough what oxygen is to fire) 3)  Augmentin 875 mg twice daily with water and eat yogurt for lunch 4)  Prednisone 10mg   2 daily back to normal then 1 daily until return 5)  Please schedule a follow-up appointmentTuesday the 6th March sooner if needed  6)  Prescriptions: PREDNISONE 10 MG  TABS (PREDNISONE) 2 each am until better then 1 daily with breakfast  #30 x 0   Entered and Authorized by:   Nyoka Cowden MD   Signed by:   Nyoka Cowden MD on 03/03/2010   Method used:   Electronically to        [facility]. [phone]* (retail)       [address]       [place], [place]  [address]       Ph: [phone]       Fax: [phone]   RxID:   [id number] AUGMENTIN 875-125 MG  TABS (AMOXICILLIN-POT CLAVULANATE) By mouth twice daily with large glass of water  #20 x 0   Entered and Authorized by:   Nyoka Cowden MD   Signed by:   Nyoka Cowden MD on 03/03/2010   Method used:   Electronically to        [facility]. [phone]* (retail)       [address]       [place], [place]  [address]       Ph: [phone]       Fax: [phone]   RxID:   [id number]

## 2010-03-10 ENCOUNTER — Encounter (HOSPITAL_COMMUNITY): Payer: Medicare Other

## 2010-03-15 ENCOUNTER — Encounter (HOSPITAL_COMMUNITY): Payer: Medicare Other

## 2010-03-15 ENCOUNTER — Ambulatory Visit: Payer: Medicare Other | Admitting: Internal Medicine

## 2010-03-16 ENCOUNTER — Telehealth: Payer: Self-pay | Admitting: Internal Medicine

## 2010-03-17 ENCOUNTER — Encounter: Payer: Self-pay | Admitting: Internal Medicine

## 2010-03-17 ENCOUNTER — Ambulatory Visit (INDEPENDENT_AMBULATORY_CARE_PROVIDER_SITE_OTHER): Payer: Medicare Other | Admitting: Internal Medicine

## 2010-03-17 ENCOUNTER — Encounter (HOSPITAL_COMMUNITY): Payer: Medicare Other

## 2010-03-17 DIAGNOSIS — J449 Chronic obstructive pulmonary disease, unspecified: Secondary | ICD-10-CM

## 2010-03-17 NOTE — Progress Notes (Signed)
Summary: antibiotic is causing diaherra > try immodium  Phone Note Call from Patient   Caller: Patient Call For: [name] Summary of Call: Patient phoned stated that he is having a problem that he needs some advice on, he is taking an antibiotic called Amoxaclav and stated that Dr Sherene Sires told him to drink a large glass of water and eat yougart with this to prevent diarrhea but this hasnt worked and he wants to know if something can be prescribed or what he can take for the diarrhea. He can be reached at [phone] Initial call taken by: Vedia Coffer,  March 08, 2010 2:19 PM  Follow-up for Phone Call        [facility] Yetta Barre RN  March 08, 2010 3:57 PM  Pt returned call.  States he is taking augmentin with a full glass of water and eating yogurt but still having diarrhea with this medication.  Requesting any further recs or if med can be called in to help with this.  Advised Dr. Sherene Sires out of office until tomorrow am -- pt ok with this.  Dr. Sherene Sires, pls advise.  Thanks!  Allergies - verified:  ! SULFA ! * OMNICEF  [facility] [address] Follow-up by: Gweneth Dimitri RN,  March 08, 2010 4:44 PM  Additional Follow-up for Phone Call Additional follow up Details #1::        can use immodium otc as long as no fever or abd pain with the diarrhea, if not responding to otcs needs to stop the augmentin. avoid dairy products and fresh veggies and salads for now Additional Follow-up by: Nyoka Cowden MD,  March 08, 2010 8:39 PM    Additional Follow-up for Phone Call Additional follow up Details #2::    I informed pt of MW recs, pt denied fever and abd pain, will try OTC and call if worse. Zackery Barefoot CMA  March 09, 2010 10:23 AM

## 2010-03-22 ENCOUNTER — Encounter (HOSPITAL_COMMUNITY): Payer: Medicare Other

## 2010-03-22 NOTE — Progress Notes (Signed)
Summary: nos appt  Phone Note Call from Patient   Caller: [email or web address]  Call For: Arica Bevilacqua Summary of Call: In ref to nos from 3/6 pt has appt on 3/8. Initial call taken by: Darletta Moll,  March 16, 2010 10:41 AM

## 2010-03-22 NOTE — Assessment & Plan Note (Signed)
Summary: Pulmonary/ f/u ov try astepro for pnds   Primary Provider/Referring Provider:  [name]  CC:  Cough and Dyspnea- some better.  History of Present Illness: 77  yowm last smoked 1983  severe COPD  FEV1 of only 36% April 2007 with a 29% improvement after bronchodilators consistent with an asthmatic component.   Baseline activity level--walk 1 mile daily with oxygen on. Maintained on Symbicort 160/4.15mcg 1 puff two times a day.    February 11, 2008 ov never took spiriva consistently.  No variability to doe or sign cough. rec spiriva but w/in one week cp > MCH with neg cardiac wu, still having occ cp , positional, better with rubbing chest and relaxing.  March 26, 2008 doe x 30 min mile on 02, in retrospect better on spiriva.no cough. no itching sneezing or wheezing. Try spiriva every other for several weeks then take daily, think of it like high octane fuel for your car - stop spiriva if you don't feel it's helping you ex performance or if you have any side effects Please schedule a follow-up appointment in 3 months > never took   June 30, 2008 ov no longer walking on 02, does walk to shed and back without 02 and without stopping but confused with instructions on how to take rescue rx, having variable doe minimal cough no noct c/o's.  rec trial of spiriva.  October 15, 2008 rov follow up, about the same  did not get along well with spiriva , discont. taking caused dry mouth. still definitely benefits from as needed xopenex.    November 19, 2008--Presents for follow up. was switched from metoprolol to bystolic at last OV.  states was feeling tired, "beat up" and lethargic so began the metoprolol again  but did not feel better.  would like to know if he may switch back to the bystolic , and also the spiriva. to help with breathing. rec resume spiriva > stopped due to urinary retention while on a trip to [facility]  May 03, 2009    c/o breathing worsening over the past 2-3 wk, gets SOB with  any activity.  He also c/o leg swelling getting worse- lasix is not helping and he c/o low BP when uses lasix. no cough.. better on 02, p saba  page 2       August 19, 2009 Acute visit.  Pt c/o wheezing and cough x 75 days. Cough is prod with frothy white sputum.  He also c/o increased SOB.  He has been using o2 all the time since symptoms started.   comfortable at rest on room air and ok lying down on 2 lpm but otherwise sob.  no cp. no purulent sputum.  mod nasal congestion ? related to 02.    September 02, 2009 --Presents for follow up and med reivew. Unfortunately  pt did not bring not any meds with him today.   He was given rechallenged with Spriiva last vist. He does not have any nocticeable improvement in breathing on Spriva and it causes worsening of urinary problems/dribbling. We reviewed his med list on MAR. Continue Symbicort 2 puffs two times a day  stop Spiriva > urinary symptoms resolved   November 04, 2009 ov cc doe x 1 month rx with nebulizer by Doctor in [place] with abrupt onset 10/25 of yellow mucus and gen chest tightness with coughing but nothing lateralizing, mild nasal congestion and watery nasal discharge also.  confused with meds, did not bring calendar. >>rec to  finish zpack and take steroid pack.  11/22/09--Presents for a follow up and med review.  Had bronchitic flare last ov , was given a steroid taper. He is feeling better and is back to his baseline. We reviewed all his meds and organized them into a med calendar. Denies chest pain,  orthopnea, hemoptysis, fever, n/v/d, edema, headache.   February 18, 2010 -- ov cc cough with green/white mucus, wheezing, SOB x5days. OTC not helping. Cough is worse at night, keeping him up at times. Wheezing started yesterday.  rex doxy, prednisone then admit 2/14 to [facility] and d/c 2/17 with ct c/w emphysema > d/c on zmax and prednisone tapered off as of March 03, 2010   March 03, 2010 ov cc minimally improved sob and cough with green  sputum and nasal congestion esp in am .. doex > slow adls    minimally better p nebs  See Patient Care Coordinator before leaving for sinus CT Prilosec otc 20mg  30 min before bfast and zantac 150  at bedtime as long as coughing ( reflux is to cough what oxygen is to fire) Augmentin 875 mg twice daily with water and eat yogurt for lunch Prednisone 10mg   2 daily back to normal then 1 daily until return.  March 17, 2010 ov Cough and Dyspnea- some better,  mucus is now white and less volume worse lying down, less need for rescue. Pt denies any significant sore throat, dysphagia, itching, sneezing,  nasal congestion or excess secretions,  fever, chills, sweats, unintended wt loss, pleuritic or exertional cp, hempoptysis, change in activity tolerance  orthopnea pnd or leg swelling.  Pt also denies any obvious fluctuation in symptoms with weather or environmental change or other alleviating or aggravating factors.       Current Medications (verified): 1)  Toprol Xl 25 Mg Xr24h-Tab (Metoprolol Succinate) .Marland Kitchen.. 1 Once Daily 2)  Lipitor 20 Mg  Tabs (Atorvastatin Calcium) .... Take 1 Tab By Mouth At Bedtime 3)  Multivitamins   Tabs (Multiple Vitamin) .... Once Daily 4)  Ascriptin 325 Mg Tabs (Aspirin Buf(Alhyd-Mghyd-Cacar)) .... Once Daily 5)  Vitamin D3 1000 Unit Tabs (Cholecalciferol) .... Take 1 Tablet By Mouth Once A Day 6)  Symbicort 160-4.5 Mcg/act  Aero (Budesonide-Formoterol Fumarate) .... 2 Puffs First Thing  in Am and 2 Puffs Again in Pm About 12 Hours Later 7)  Ipratropium Bromide 0.02 % Soln (Ipratropium Bromide) .... Inhale 1 Vial Via Hhn Two Times A Day 8)  Xopenex 1.25 Mg/17ml Nebu (Levalbuterol Hcl) .... Inhale 1 Vial Via Hhn Two Times A Day 9)  Oxygen 2l/min .... Wear At Bedtime 10)  Claritin 10 Mg Tabs (Loratadine) .... Take 1 Tablet By Mouth Once A Day As Needed 11)  Furosemide 20 Mg Tabs (Furosemide) .Marland Kitchen.. 1 By Mouth Once Daily As Needed Leg Swelling 12)  Mucinex 600 Mg Xr12h-Tab  (Guaifenesin) .... Take 1-2 Tablets Every 12 Hours As Needed 13)  Xopenex Hfa 45 Mcg/act Aero (Levalbuterol Tartrate) .... 2 Every 4 Hours If Needed 14)  Xopenex 1.25 Mg/62ml Nebu (Levalbuterol Hcl) .... Inhale 1 Vial Via Hhn Every 4 Hours As Needed 15)  Oxygen 2l/min .... Wear As Needed For Shortness of Breath 16)  Zantac 150 Mg Tabs (Ranitidine Hcl) .... Per Box As Needed 17)  Nebulizer Machine of Choice .... Use As Directed For Breathing Treatments 18)  Atacand 8 Mg Tabs (Candesartan Cilexetil) .Marland Kitchen.. 1 Once Daily 19)  Nasonex 50 Mcg/act Susp (Mometasone Furoate) .Marland Kitchen.. 1 Spray Each Nostril Two Times  A Day As Needed 20)  Prednisone 10 Mg  Tabs (Prednisone) .... 2 Each Am Until Better Then 1 Daily With Breakfast-Stopped Taking 21)  Prilosec Otc 20 Mg Tbec (Omeprazole Magnesium) .... Take  One 30-60 Min Before First Meal of The Day  Allergies (verified): 1)  ! Sulfa 2)  ! * Omnicef  Vital Signs:  Patient profile:   77 year old male Weight:      153 pounds O2 Sat:      97 % on Room air Temp:     97.9 degrees F oral Pulse rate:   89 / minute BP sitting:   138 / 72  (left arm) BP standing:   134 / 70  (left arm)  Vitals Entered By: [name] (March 17, 2010 11:47 AM)  O2 Flow:  Room air   Impression & Recommendations:  Problem # 1:  COPD UNSPECIFIED (ICD-496) DDX of  difficult airways managment all start with A and  include Adherence, Ace Inhibitors, Acid Reflux, Active Sinus Disease, Alpha 1 Antitripsin deficiency, Anxiety masquerading as Airways dz,  ABPA,  allergy(esp in young), Aspiration (esp in elderly), Adverse effects of DPI,  Active smokers, plus two Bs  = Bronchiectasis and Beta blocker use..and one C= CHF    Adherence seems very good    Active sinusitis:  CT shows thickening only but   purulent drainage better p  10 days of augmentin plus continued aggressive nasal hygiene, reviewed cr rx   Acid reflux:  See instructions for specific recommendations    Each  maintenance medication was reviewed in detail including most importantly the difference between maintenance and as needed and under what circumstances the prns are to be used. See instructions for specific recommendations   Medications Added to Medication List This Visit: 1)  Atacand 8 Mg Tabs (Candesartan cilexetil) .Marland Kitchen.. 1 once daily 2)  Prednisone 10 Mg Tabs (Prednisone) .... 2 each am until better then 1 daily with breakfast-stopped taking 3)  Astepro 0.15 % Soln (Azelastine hcl) .... 2 puffs at bedtime and every 6 hours as needed  Other Orders: Est. Patient Level III (04540)  Patient Instructions: 1)  Try astepro 2 puffs at bedtime and every 6 hours if needed for nasal symptoms and try off nasonex 2)  Ok to restart prednisone at 10 mg daily if worsen off it 3)  Please schedule a follow-up appointment in 6 weeks, sooner if needed

## 2010-03-23 ENCOUNTER — Ambulatory Visit: Payer: Medicare Other | Admitting: Internal Medicine

## 2010-03-24 ENCOUNTER — Encounter (HOSPITAL_COMMUNITY): Payer: Medicare Other

## 2010-03-24 ENCOUNTER — Ambulatory Visit: Payer: Medicare Other | Admitting: Internal Medicine

## 2010-03-29 ENCOUNTER — Encounter (HOSPITAL_COMMUNITY): Payer: Medicare Other

## 2010-03-31 ENCOUNTER — Encounter (HOSPITAL_COMMUNITY): Payer: Medicare Other

## 2010-04-05 ENCOUNTER — Encounter (HOSPITAL_COMMUNITY): Payer: Medicare Other

## 2010-04-07 ENCOUNTER — Encounter (HOSPITAL_COMMUNITY): Payer: Medicare Other

## 2010-04-12 ENCOUNTER — Encounter (HOSPITAL_COMMUNITY): Payer: Medicare Other

## 2010-04-14 ENCOUNTER — Encounter (HOSPITAL_COMMUNITY): Payer: Medicare Other

## 2010-04-19 ENCOUNTER — Encounter (HOSPITAL_COMMUNITY): Payer: Medicare Other

## 2010-04-21 ENCOUNTER — Encounter (HOSPITAL_COMMUNITY): Payer: Medicare Other

## 2010-04-26 ENCOUNTER — Encounter: Payer: Self-pay | Admitting: Internal Medicine

## 2010-04-26 ENCOUNTER — Encounter (HOSPITAL_COMMUNITY): Payer: Medicare Other

## 2010-04-26 LAB — DIFFERENTIAL
Basophils Absolute: 0.1 10*3/uL (ref 0.0–0.1)
Basophils Relative: 1 % (ref 0–1)
Eosinophils Absolute: 0.1 10*3/uL (ref 0.0–0.7)
Eosinophils Relative: 2 % (ref 0–5)
Lymphocytes Relative: 33 % (ref 12–46)
Lymphs Abs: 2.3 10*3/uL (ref 0.7–4.0)
Monocytes Absolute: 0.7 10*3/uL (ref 0.1–1.0)
Monocytes Relative: 10 % (ref 3–12)
Neutro Abs: 3.8 10*3/uL (ref 1.7–7.7)
Neutrophils Relative %: 55 % (ref 43–77)

## 2010-04-26 LAB — BASIC METABOLIC PANEL
BUN: 14 mg/dL (ref 6–23)
CO2: 27 mEq/L (ref 19–32)
Calcium: 9.4 mg/dL (ref 8.4–10.5)
Chloride: 99 mEq/L (ref 96–112)
Creatinine, Ser: 1.08 mg/dL (ref 0.4–1.5)
GFR calc Af Amer: >60 mL/min (ref >60)
GFR calc non Af Amer: >60 mL/min (ref >60)
Glucose, Bld: 117 mg/dL — ABNORMAL HIGH (ref 70–99)
Potassium: 3.9 mEq/L (ref 3.5–5.1)
Sodium: 135 mEq/L (ref 135–145)

## 2010-04-26 LAB — CBC
HCT: 44.2 % (ref 39.0–52.0)
HCT: 37.3 % — ABNORMAL LOW (ref 39.0–52.0)
Hemoglobin: 15.4 g/dL (ref 13.0–17.0)
Hemoglobin: 13 g/dL (ref 13.0–17.0)
MCHC: 34.8 g/dL (ref 30.0–36.0)
MCHC: 35 g/dL (ref 30.0–36.0)
MCV: 97.6 fL (ref 78.0–100.0)
MCV: 96.9 fL (ref 78.0–100.0)
Platelets: 214 10*3/uL (ref 150–400)
Platelets: 177 10*3/uL (ref 150–400)
RBC: 4.53 MIL/uL (ref 4.22–5.81)
RBC: 3.85 MIL/uL — ABNORMAL LOW (ref 4.22–5.81)
RDW: 13 % (ref 11.5–15.5)
RDW: 12.9 % (ref 11.5–15.5)
WBC: 6.9 10*3/uL (ref 4.0–10.5)
WBC: 7.2 10*3/uL (ref 4.0–10.5)

## 2010-04-26 LAB — LIPID PANEL
Cholesterol: 130 mg/dL (ref 0–200)
HDL: 61 mg/dL (ref >39)
LDL Cholesterol: 62 mg/dL (ref 0–99)
Total CHOL/HDL Ratio: 2.1 RATIO
Triglycerides: 35 mg/dL (ref <150)
VLDL: 7 mg/dL (ref 0–40)

## 2010-04-26 LAB — HEPARIN LEVEL (UNFRACTIONATED)
Heparin Unfractionated: 0.83 IU/mL — ABNORMAL HIGH (ref 0.30–0.70)
Heparin Unfractionated: 0.87 IU/mL — ABNORMAL HIGH (ref 0.30–0.70)
Heparin Unfractionated: 0.56 IU/mL (ref 0.30–0.70)

## 2010-04-26 LAB — CARDIAC PANEL(CRET KIN+CKTOT+MB+TROPI)
CK, MB: 1.2 ng/mL (ref 0.3–4.0)
CK, MB: 1.2 ng/mL (ref 0.3–4.0)
Relative Index: INVALID (ref 0.0–2.5)
Relative Index: INVALID (ref 0.0–2.5)
Total CK: 65 U/L (ref 7–232)
Total CK: 89 U/L (ref 7–232)
Troponin I: <0.01 ng/mL (ref 0.00–0.06)
Troponin I: <0.01 ng/mL (ref 0.00–0.06)

## 2010-04-26 LAB — POCT CARDIAC MARKERS
CKMB, poc: 1.1 ng/mL (ref 1.0–8.0)
CKMB, poc: <1 ng/mL — ABNORMAL LOW (ref 1.0–8.0)
Myoglobin, poc: 39.8 ng/mL (ref 12–200)
Myoglobin, poc: 53.7 ng/mL (ref 12–200)
Troponin i, poc: <0.05 ng/mL (ref 0.00–0.09)
Troponin i, poc: <0.05 ng/mL (ref 0.00–0.09)

## 2010-04-26 LAB — CK TOTAL AND CKMB (NOT AT ARMC)
CK, MB: 1.6 ng/mL (ref 0.3–4.0)
Relative Index: 1.5 (ref 0.0–2.5)
Total CK: 106 U/L (ref 7–232)

## 2010-04-26 LAB — PROTIME-INR
INR: 1 (ref 0.00–1.49)
Prothrombin Time: 13.2 seconds (ref 11.6–15.2)

## 2010-04-26 LAB — TSH: TSH: 10.281 u[IU]/mL — ABNORMAL HIGH (ref 0.350–4.500)

## 2010-04-26 LAB — T4, FREE: Free T4: 1.14 ng/dL (ref 0.89–1.80)

## 2010-04-26 LAB — BRAIN NATRIURETIC PEPTIDE: Pro B Natriuretic peptide (BNP): 134 pg/mL — ABNORMAL HIGH (ref 0.0–100.0)

## 2010-04-26 LAB — T3: T3, Total: 84.7 ng/dl (ref 80.0–204.0)

## 2010-04-26 LAB — TROPONIN I: Troponin I: <0.01 ng/mL (ref 0.00–0.06)

## 2010-04-28 ENCOUNTER — Encounter (HOSPITAL_COMMUNITY): Payer: Medicare Other

## 2010-04-28 ENCOUNTER — Ambulatory Visit (INDEPENDENT_AMBULATORY_CARE_PROVIDER_SITE_OTHER): Payer: Medicare Other | Admitting: Internal Medicine

## 2010-04-28 ENCOUNTER — Encounter: Payer: Self-pay | Admitting: Internal Medicine

## 2010-04-28 DIAGNOSIS — J449 Chronic obstructive pulmonary disease, unspecified: Secondary | ICD-10-CM

## 2010-04-28 DIAGNOSIS — J310 Chronic rhinitis: Secondary | ICD-10-CM

## 2010-04-28 NOTE — Progress Notes (Signed)
Subjective:    Patient ID: [name], male    DOB: 1929-09-23, 77 y.o.   MRN: [id number]  HPI Summary: Pulmonary/ f/u ov try astepro for pnds   Primary Provider/Referring Provider: Threasa Heads yowm last smoked 1983 severe COPD FEV1 of only 36% April 2007 with a 29% improvement after bronchodilators consistent with an asthmatic component. Baseline activity level--walk 1 mile daily with oxygen on. Maintained on Symbicort 160/4.16mcg 1 puff two times a day.   February 11, 2008 ov never took spiriva consistently. No variability to doe or sign cough. rec spiriva but w/in one week cp > MCH with neg cardiac wu, still having occ cp , positional, better with rubbing chest and relaxing.  Retry spiriva   October 15, 2008 rov follow up, about the same did not get along well with spiriva , discont. taking caused dry mouth. still definitely benefits from as needed xopenex.    February 18, 2010 -- ov cc cough with green/white mucus, wheezing, SOB x5days. OTC not helping. Cough is worse at night, keeping him up at times. Wheezing started yesterday. rex doxy, prednisone then admit 2/14 to [facility] and d/c 2/17 with ct c/w emphysema > d/c on zmax and prednisone tapered off as of March 03, 2010   March 03, 2010 ov cc  minimally improved sob and cough with green sputum and nasal congestion esp in am .. doex > slow adls  minimally better p nebs rec  See Patient Care Coordinator before leaving for sinus CT  Thickening only  Prilosec otc 20mg  30 min before bfast and zantac 150 at bedtime as long as coughing ( reflux is to cough what oxygen is to fire)  Augmentin 875 mg twice daily with water and eat yogurt for lunch  Prednisone 10mg  2 daily back to normal then 1 daily until return.   March 17, 2010 ov Cough and Dyspnea- some better, mucus is now white and less volume worse lying down, less need for rescue.  rec taper off prednisone >  5 days off  04/28/2010 ov/[name] no worse off prednisone,  Rare astepro, no  need for rescue or even using the atrovent/xop more than twice dailly - overall much better.  Pt denies any significant sore throat, dysphagia, itching, sneezing,  nasal congestion or excess/ purulent secretions,  fever, chills, sweats, unintended wt loss, pleuritic or exertional cp, hempoptysis, orthopnea pnd or leg swelling.    Also denies any obvious fluctuation of symptoms with weather or environmental changes or other aggravating or alleviating factors.     Past Medical History:  CHRONIC RHINITIS (ICD-472.0)  - Sinus ct March 03, 2010 > thickening only, no acute changes  CHRONIC RESPIRATORY FAILURE (ICD-518.83)  - 02 rx since 2008  COPD UNSPECIFIED (ICD-496)....................................................................[name]  - PFTs 05/04/05 FEV1 36% ratio 28% with 29% improvement after bronchodilators DLCO 48%  - PFT's 03/26/08 30% ratio 34 with 14% improvement after bronchodilators DLC0 38 %  - Start noct 02 at 2lpm 2008 and on 03/04/09 desat @ > 185 ft so rec wear with activtiy > rm to rm  - [facility] June 30, 2008 100% > confirmed March 04, 2009  ISCHEMIC HEART DISEASE (ICD-414.9)  HYPERTENSION (ICD-401.9)  HYPERLIPIDEMIA (ICD-272.4)  SPN RUL by cxr August 19, 2009  COMPLEX MED REGIMEN-Meds reviewed with pt education and computerized med calendar 11/22/09               Review of Systems     Objective:   Physical Exam  amb wm nad wt 159 04/28/2010  HEENT mild turbinate edema.  Oropharynx no thrush or excess pnd or cobblestoning.  No JVD or cervical adenopathy. Mild accessory muscle hypertrophy. Trachea midline, nl thryroid. Chest was hyperinflated by percussion with diminished breath sounds and moderate increased exp time without wheeze. Hoover sign positive at mid inspiration. Regular rate and rhythm without murmur gallop or rub or increase P2 or edema.  Abd: no hsm, nl excursion. Ext warm without cyanosis or clubbing.         Assessment & Plan:

## 2010-04-28 NOTE — Assessment & Plan Note (Signed)
Optimal functional status despite GOLD IV copd.    Each maintenance medication was reviewed in detail including most importantly the difference between maintenance and as needed and under what circumstances the prns are to be used.  Please see instructions for details which were reviewed in writing and the patient given a copy.  See instructions for specific recommendations which were reviewed directly with the patient who was given a copy with highlighter outlining the key components.

## 2010-04-28 NOTE — Patient Instructions (Addendum)
Ipatropium / xopenex are twice regulary with ability to increase to 4 x daily if breathing worsens   Please schedule a follow up visit in 3 months but call sooner if needed

## 2010-05-03 ENCOUNTER — Encounter (HOSPITAL_COMMUNITY): Payer: Medicare Other

## 2010-05-04 ENCOUNTER — Telehealth: Payer: Self-pay | Admitting: Internal Medicine

## 2010-05-04 MED ORDER — DOXYCYCLINE HYCLATE 100 MG PO TABS: 100.0000 mg | ORAL_TABLET | Freq: Two times a day (BID) | ORAL | Status: AC

## 2010-05-04 NOTE — Telephone Encounter (Signed)
Pt was seen by MW on 04/28/10 and was told to : Ipatropium / xopenex are twice regulary with ability to increase to 4 x daily if breathing worsens  Please schedule a follow up visit in 3 months but call sooner if needed   Called, spoke with pt.  He c/o increased SOB, slight wheezing, scratchy throat, and increased mucus production x 2 days.  Mucus is clear.  He states he hasn't increased nebs yet but is going to do that today.  States the last time he had these sxs he ended up in the hospital with an exacerbation.  He is requesting "to head things off before they get worse" and requesting z pak and prednisone.  There are no openings tomorrow and pt stated he really did not want to come in.  MW out of office this evening - will forward message to doc of the day to address.  Dr. Shelle Iron, pls advise.  Thanks!

## 2010-05-04 NOTE — Telephone Encounter (Signed)
Tell him to increase his neb treatments to 4x a day until he is better as directed by Dr. Sherene Sires. Ok to call in doxycycline 100mg  bid for 5 days, no fills Would hold off on prednisone unless he is worsening further..consider ov with Glade Lloyd?

## 2010-05-04 NOTE — Telephone Encounter (Signed)
lmomtcb x1 

## 2010-05-05 ENCOUNTER — Encounter (HOSPITAL_COMMUNITY): Payer: Medicare Other

## 2010-05-05 NOTE — Telephone Encounter (Signed)
Pt states he will try KC's recs and see if he feels better. Pt is aware doxy was sent to pharmacy. Pt is scheduled to see MW Monday at 11:30. Nothing further was needed

## 2010-05-05 NOTE — Telephone Encounter (Signed)
lmomtcb x 2  

## 2010-05-05 NOTE — Telephone Encounter (Signed)
PT RETURNED CALL FROM FROM TRIAGE. [name]

## 2010-05-06 ENCOUNTER — Encounter: Payer: Self-pay | Admitting: Internal Medicine

## 2010-05-09 ENCOUNTER — Ambulatory Visit: Payer: Medicare Other | Admitting: Internal Medicine

## 2010-05-10 ENCOUNTER — Encounter (HOSPITAL_COMMUNITY): Payer: Medicare Other

## 2010-05-12 ENCOUNTER — Encounter (HOSPITAL_COMMUNITY): Payer: Medicare Other

## 2010-05-17 ENCOUNTER — Encounter (HOSPITAL_COMMUNITY): Payer: Medicare Other

## 2010-05-19 ENCOUNTER — Encounter (HOSPITAL_COMMUNITY): Payer: Medicare Other

## 2010-05-20 ENCOUNTER — Other Ambulatory Visit (INDEPENDENT_AMBULATORY_CARE_PROVIDER_SITE_OTHER): Payer: Medicare Other

## 2010-05-20 DIAGNOSIS — I6529 Occlusion and stenosis of unspecified carotid artery: Secondary | ICD-10-CM

## 2010-05-24 ENCOUNTER — Encounter (HOSPITAL_COMMUNITY): Payer: Medicare Other

## 2010-05-24 NOTE — Discharge Summary (Signed)
[name], Travis NO.:  [id number]   MEDICAL RECORD NO.:  [id number]          PATIENT TYPE:  INP   LOCATION:  2927                         FACILITY:  [facility]   PHYSICIAN:  Lyn Records, M.D.   DATE OF BIRTH:  03-09-1929   DATE OF ADMISSION:  02/24/2008  DATE OF DISCHARGE:  02/25/2008                               DISCHARGE SUMMARY   DISCHARGE DIAGNOSES:  1. Palpitations, resolved.  2. Chest pain, resolved.  3. Chronic obstructive pulmonary disease, on home oxygen.  4. Coronary artery disease, history of coronary artery bypass graft in      2003.  5. Carotid artery stenosis.  6. Peripheral artery disease.  7. Allergy to SULFA.  8. Long-term medication use.   [name] is a 77 year old male patient with known coronary artery  disease, who presented to the emergency room with a chief complaint of  palpitations for 1 day.  He states he felt several large stumps in his  chest and this was associated with pauses afterwards.  He thought his  heart might stop.  He also has had chest pain on and off for 2 days.  He  exercises regularly and experienced no chest pain on exertion within the  previous week.  In the emergency room, his EKG showed lateral ST-segment  depressions, and the chest pain continued.  He was admitted to the  hospital and laboratory studies showed total cholesterol 130,  triglycerides 35, HDL 61, and LDL 62.  Hemoglobin 13.0, hematocrit 37.3,  white count 7.2, and platelets 177.  Cardiac enzymes were normal.  TSH  is 10.281.  BNP 134.   He was watched for the next 24-48 hours.  He had intermittent chest pain  with occasional PVCs.  Because of the chest pain, he was put on IV  nitroglycerin, but on February 25, 2008, he wanted to go home.  He did  not want to have a heart catheterization.  We let him ambulate, he did  this without difficulty, and we are now discharging him to home with  plans to return to the office in the near future to see how  he is doing  and to hopefully schedule cardiac catheterization.   We are discharging him to home in stable and improved condition on the  following medications:  1. Enteric-coated aspirin 325 mg a day.  2. Atacand 8 mg a day.  3. Lipitor 20 mg a day.  4. Toprol-XL 25 mg a day.  5. Sublingual nitroglycerin p.r.n. chest pain.   Remain on a low-sodium heart-healthy diet.  Increase activity slowly.  Follow up with Dr. Katrinka Blazing on March 10, 2008, at 1 p.m.      Guy Franco, P.A.      Lyn Records, M.D.  Electronically Signed    LB/MEDQ  D:  02/25/2008  T:  02/25/2008  Job:  [id number]

## 2010-05-24 NOTE — Procedures (Signed)
CAROTID DUPLEX EXAM   INDICATION:  Carotid artery disease.   HISTORY:  Diabetes:  No.  Cardiac:  CABG.  Hypertension:  Yes.  Smoking:  No.  Previous Surgery:  No.  CV History:  Asymptomatic.  Amaurosis Fugax No, Paresthesias No, Hemiparesis No.                                       RIGHT             LEFT  Brachial systolic pressure:         156               158  Brachial Doppler waveforms:         Normal            Normal  Vertebral direction of flow:        Antegrade         Antegrade  DUPLEX VELOCITIES (cm/sec)  CCA peak systolic                   90                109  ECA peak systolic                   97                105  ICA peak systolic                   159               148  ICA end diastolic                   40                39  PLAQUE MORPHOLOGY:                  Mixed             Mixed  PLAQUE AMOUNT:                      Moderate          Moderate  PLAQUE LOCATION:                    ICA/ECA           ICA/ECA   IMPRESSION:  1. 40-59% stenosis of the bilateral internal carotid arteries.  2. No significant change noted when compared to the previous      examination on 07/09/07.       ___________________________________________  [name]. Edilia Bo, M.D.   CH/MEDQ  D:  06/23/2008  T:  06/23/2008  Job:  [id number]

## 2010-05-24 NOTE — Procedures (Signed)
CAROTID DUPLEX EXAM   INDICATION:  Follow up carotid artery disease.   HISTORY:  Diabetes:  No.  Cardiac:  CABG.  Hypertension:  Yes.  Smoking:  No.  Previous Surgery:  No.  CV History:  No.  Amaurosis Fugax No, Paresthesias No, Hemiparesis No.                                       RIGHT             LEFT  Brachial systolic pressure:         118               122  Brachial Doppler waveforms:         WNL               WNL  Vertebral direction of flow:        Antegrade         Antegrade  DUPLEX VELOCITIES (cm/sec)  CCA peak systolic                   83                97  ECA peak systolic                   96                153  ICA peak systolic                   199               131  ICA end diastolic                   62                42  PLAQUE MORPHOLOGY:                  Heterogenous      Calcific  PLAQUE AMOUNT:                      Moderate          Mild  PLAQUE LOCATION:                    Bulb, ICA         ICA, ECA   IMPRESSION:  1. Right internal carotid artery suggests 60% to 79% stenosis.  2. Left internal carotid artery suggests 40% to 59% stenosis.  3. Antegrade flow in bilateral vertebrals.         ___________________________________________  [name]. Edilia Bo, M.D.   CB/MEDQ  D:  06/24/2009  T:  06/24/2009  Job:  [id number]

## 2010-05-24 NOTE — H&P (Signed)
NAME:  [name], [name] NO.:  [id number]   MEDICAL RECORD NO.:  [id number]          PATIENT TYPE:  INP   LOCATION:  1825                         FACILITY:  [facility]   PHYSICIAN:  Wendi Snipes, MD DATE OF BIRTH:  04-09-1929   DATE OF ADMISSION:  02/24/2008  DATE OF DISCHARGE:                              HISTORY & PHYSICAL   CARDIOLOGIST:  Dr. Verdis Prime.   CHIEF COMPLAINTS:  Palpitations.   HISTORY OF PRESENT ILLNESS:  This is a 77 year old white male with a  history of coronary disease status post coronary artery bypass graft who  is here with chief complaint of palpitations for approximately 1 day.  He states he felt several large thumps in his chest, and this was  associated with pauses afterwards.  His was afraid his heart would stop.  Here, he endorses chest pain for 2 days.  This is on and off, though he  states that it feels like it is musculoskeletal.  He states that he  exercises regularly and experienced no chest pain on exertion in the  previous week.  He has otherwise been in good health.  However, today he  states that he took a day off of exercise because of feeling poorly.  In  the ED, his EKG showed lateral ST depressions, and his chest pain  continued.  Otherwise, he denies syncope, presyncope, increased lower  extremity edema, paroxysmal nocturnal dyspnea, orthopnea.   PAST MEDICAL HISTORY:  1. COPD on home oxygen.  2. Coronary disease status post CABG x5 September 13, 2001.  He      reportedly had a negative Cardiolite in Dr. Michaelle Copas office in May      2009.  3. Carotid artery stenosis.  4. Peripheral artery disease.   ALLERGIES:  To SULFA.   MEDICATIONS ON ADMISSION:  1. Aspirin 81 mg daily.  2. Atacand 8 mg daily.  3. Lipitor 20 mg daily.  4. Toprol-XL 25 mg daily.   SOCIAL HISTORY:  He is retired.  He is married.  He quit smoking in  1983.   FAMILY HISTORY:  Is significant for early coronary disease.   REVIEW OF SYSTEMS:   All 14 systems were reviewed and were negative  except as mentioned in detail in the HPI.   PHYSICAL EXAMINATION:  Blood pressure is 167/89, respiratory rate 16,  pulse 92, saturating 98% on 2 liters nasal cannula.  GENERAL:  He is a 77 year old white male appearing stated age in no  acute distress.  HEENT:  Moist mucous membranes.  Pupils equal, round, react to light and  accommodation.  Anicteric sclera.  NECK:  No jugular venous distention.  No thyromegaly.  CARDIOVASCULAR:  Regular rate and rhythm.  No murmurs, rubs or gallops.  LUNGS:  Clear to auscultation bilaterally.  ABDOMEN:  Nontender, nondistended.  Positive bowel sounds.  No masses.  EXTREMITIES:  2+ pulses throughout.  No clubbing, cyanosis, or edema.  NEUROLOGIC:  Alert and oriented x3.  Cranial nerves II-XII grossly  intact.  No focal neurologic deficits.   RADIOLOGY:  Chest x-ray is pending.  EKG shows normal sinus rhythm at  rate of 91 beats per minute with some very mild lateral ST depressions.   LABORATORY DATA:  White blood cell count is 7, his hematocrit is 44, his  platelets 214.  His troponin is undetectable.   ASSESSMENT:  This is a 77 year old with a history of coronary disease  here with palpitations, though endorsing chest pain on further  questioning, with EKG changes.   1. Chest pain.  Will treat this as unstable angina, though his current      symptoms are somewhat atypical.  His EKG changes suggest that he      may be having active ischemia, and he may benefit from      catheterization in the context of his recent symptoms.  We will      rule out myocardial infarction.  2. Palpitations.  He had ectopy on telemetry.  He may need further      outpatient monitoring to ascertain if there is any atrial      arrhythmias causing his symptoms.  However, there are likely      symptomatic PVCs and PVCs.  Will consider increasing his beta-      blocker if they continue to be symptomatic.      Wendi Snipes, MD  Electronically Signed     BHH/MEDQ  D:  02/24/2008  T:  02/24/2008  Job:  [id number]

## 2010-05-24 NOTE — Assessment & Plan Note (Signed)
OFFICE VISIT   [name], Travis L  DOB:  09/16/1929                                       06/27/2006  CHART#:[id number]   I saw the patient in the office today for continued followup of his  carotid disease.  Since I saw him last a year ago, he has had no history  of stroke, TIAs, expressive or receptive aphasia, or amaurosis fugax.   REVIEW OF SYSTEMS:  He does admit to dyspnea on exertion.  He has been  running oxygen when he is on the treadmill.  He also notes that his  claudication symptoms in his calves have improved since he has been on  oxygen, and he can walk further before experiencing symptoms.  He has  had no recent bronchitis, asthma, or wheezing.   PHYSICAL EXAMINATION:  Vital signs:  Blood pressure is 144/73, heart  rate is 64.  Neck:  I did not detect any carotid bruits.  Lungs:  Clear  bilaterally to auscultation.  Cardiac:  He has a regular rate and  rhythm.  Neurologic:  Nonfocal.   Carotid Duplex scan shows a 40-59% right carotid stenosis, with a less  than 39% left carotid stenosis.  These velocities on both sides have not  changed significantly compared with the study a year ago.   We will see him back in one year for follow-up duplex scan.  He  understands we would not consider carotid endarterectomy unless the  stenosis progressed to greater than 80% or he developed new neurologic  symptoms.  He knows to call sooner if he has problems.  In the  meantime, he knows to continue taking his aspirin.  We have also been  following his peripheral vascular disease, which remains stable, and he  is due for follow-up ABIs in 6 months also.   [name]. Edilia Bo, M.D.  Electronically Signed   CSD/MEDQ  D:  06/27/2006  T:  06/28/2006  Job:  [id number]

## 2010-05-24 NOTE — Assessment & Plan Note (Signed)
[facility]                             PULMONARY OFFICE NOTE   CHAUN, UEMURA                         MRN:          [id number]  DATE:06/12/2006                            DOB:          1929/03/28    EXTENDED FOLLOW UP OFFICE VISIT   A 77 year old white male remote smoker with severe COPD with a definite  improvement after bronchodilators, documented in April of 2007 for which  I recommended he be maintained on Advair at a dose of 100/50 b.i.d. He  found he could not tolerate this after a few days because it made him  too jittery and he stopped it. He only took it for a few days says he  does not really remember it helping his breathing.   He did not return for followup as requested a year ago and comes back  now having lost ground since January in terms of his best day  performance on a treadmill. He states typically he was able to walk 2  miles an hour flat, now can only walk 1.8 miles an hour for 30 minutes,  even after being initiated on oxygen.   He denies any real variability with weather, environmental change or  previous use of rescue inhalers. He denies any excessive sputum  production, fevers, chills, sweats, chest pain, or leg swelling.   On physical examination, he is a pleasant, ambulatory white male in no  acute distress. He is afebrile, stable vital signs.  HEENT: Unremarkable. Pharynx clear.  LUNG FIELDS: Reveal distant wheeze bilaterally with marked increase in  expiratory time especially with FVC maneuver and better with pursed-lip  maneuver.  There is a regular rate and rhythm without murmur, gallop, rub. No  increase in P2.  ABDOMEN: Soft and benign with no palpable organomegaly, mass, or  tenderness.  EXTREMITIES: Warm without calf tenderness, cyanosis, clubbing, or edema.   Recent chart was reviewed and it indicates the patient desaturated to  85% after walking 2 laps around the office (approximately 400 feet). He  also desaturated nocturnally for over 11 minutes at less than 89% with  the lowest level at 81%.   IMPRESSION:  New onset hypoxic respiratory failure that occurs both with  exertion and nocturnally in a patient with severe chronic obstructive  pulmonary disease. He does have a diffusing capacity of 48% already  documented in 2007 which correlates with the exercise hypoxemia. That is  the bad news. The good news is that he had a 29% improvement after  bronchodilators, thus the previous recommendation to use Advair.   In an effort to optimized the asthmatic component I recommended the  patient try on Symbicort 160/4.5 one puffs b.i.d. (realizing this is an  unconventional approach because he could not take Advair and also tells  me Spiriva locked up his bladder and so there are very limited options  here). If he can tolerate the 1 puff b.i.d. dosing, I have recommended  increasing to 2 puffs b.i.d. If he can not tolerate the 1 puff b.i.d., I  have recommended 1 puff q.a.m.  I emphasized the approach should be that the punishment needs to fit  the crime in terms of trying to get the patient back to his original  baseline on the minimum number of medicines possible. Hopefully  Symbicort will fit this bill.   In terms of oxygen therapy, I spent extra time explaining to him that he  desaturates at night asymptomatically, and therefore he can not use the  oxygen p.r.n. He should use the oxygen at night automatically and then  p.r.n. with exertion during the day (noting that his resting sats are  typically in the mid 90s and so he would only need it when he actually  goes out to exercise not for slow ADLs).   Follow up here will be in 1 month to check the response to Symbicort,  sooner if needed.     Charlaine Dalton. Sherene Sires, MD, [facility]  Electronically Signed    MBW/MedQ  DD: 06/12/2006  DT: 06/12/2006  Job #: [id number]   cc:   Vikki Ports, M.D.

## 2010-05-24 NOTE — Procedures (Signed)
LOWER EXTREMITY ARTERIAL EVALUATION-SINGLE LEVEL   INDICATION:  Followup of known peripheral vascular disease.  Patient  denies bilateral claudication and rest pain.   HISTORY:  Diabetes:  No.  Cardiac:  CABG x5 on 09/13/01.  Hypertension:  Yes.  Smoking:  No.  Previous Surgery:  Cardiac.   RESTING SYSTOLIC PRESSURES: (ABI)                          RIGHT                LEFT  Brachial:               170                  176  Anterior tibial:        114                  94  Posterior tibial:       119 (0.68)           100 (0.57)  Peroneal:  DOPPLER WAVEFORM ANALYSIS:  Anterior tibial:        Biphasic             Biphasic  Posterior tibial:       Biphasic             Biphasic  Peroneal:   PREVIOUS ABI'S:  Date: 07/14/04  RIGHT:  0.54  LEFT:  0.56   IMPRESSION:  1. Mild increase in right ankle brachial index.  2. Stable left ankle brachial index.   ___________________________________________  [name]. Edilia Bo, M.D.   PB/MEDQ  D:  07/09/2007  T:  07/09/2007  Job:  [id number]

## 2010-05-24 NOTE — Procedures (Signed)
CAROTID DUPLEX EXAM   INDICATION:  Followup of known carotid artery disease.  Patient had one  full syncopal event and several partial syncopal events upon standing  for approximately three months, which he states his doctor told him was  due to low blood pressure.   HISTORY:  Diabetes:  No.  Cardiac:  CABG x5 on 09/13/01.  Hypertension:  Yes.  Smoking:  No.  Previous Surgery:  Cardiac.  CV History:  Amaurosis Fugax No, Paresthesias No, Hemiparesis No.                                       RIGHT             LEFT  Brachial systolic pressure:         170               176  Brachial Doppler waveforms:         WNL               WNL  Vertebral direction of flow:        Antegrade         Antegrade  DUPLEX VELOCITIES (cm/sec)  CCA peak systolic                   50                60  ECA peak systolic                   92                99  ICA peak systolic                   186 (O)           115  ICA end diastolic                   50                31  PLAQUE MORPHOLOGY:                  Calcific          Calcific  PLAQUE AMOUNT:                      Moderate          Mild-moderate  PLAQUE LOCATION:                    DCCA, ICA         DCCA, ICA   IMPRESSION:  1. Right 40-59% internal carotid artery stenosis (upper end of range).  2. Left 40-59% internal carotid artery stenosis (low end of range).  3. Bilateral internal carotid artery velocities essentially unchanged      from that on 06/27/06.   ___________________________________________  [name]. Edilia Bo, M.D.   PB/MEDQ  D:  07/09/2007  T:  07/09/2007  Job:  [id number]

## 2010-05-24 NOTE — Assessment & Plan Note (Signed)
[facility]                             PULMONARY OFFICE NOTE   DABID, GODOWN                         MRN:          [id number]  DATE:05/29/2006                            DOB:          December 29, 1929    HISTORY OF PRESENT ILLNESS:  The patient is a 77 year old white male  patient of Dr. Sherene Sires who has a known history of severe emphysematous COPD  with an asthmatic component, who presents today for followup.  The  patient has not been seen in greater than a year, reporting that he  continues to have shortness of breath with activity.  The patient does  walk a mile a day.  However, reports that he has to push himself to be  able to complete this.  The patient is leaving on vacation in 2 weeks to  [place] and is concerned that he may have some difficulty with the high  altitudes.  The patient's previous FEV1 on PFTs last year showed an FEV1  of 0.97L, which was 36% of the predicted.  The patient had previously  been placed on Advair.  However, reports he did stop this due to him  feeling somewhat jittery.  The patient denies any chest pain,  palpitations, orthopnea, PND, or leg swelling.  The patient has been a  previous smoker in the past and quit in 1983.   PAST MEDICAL HISTORY:  Reviewed.   CURRENT MEDICATIONS:  Reviewed.   PHYSICAL EXAM:  The patient is a pleasant male in no acute distress.  He is afebrile with stable vital signs.  O2 saturation is 96% on room  air.  HEENT:  Unremarkable.  NECK:  Supple without cervical adenopathy.  No JVD.  LUNGS:  Sounds are diminished in the bases.  Otherwise, clear.  CARDIAC:  Regular rate.  ABDOMEN:  Soft and nontender.  EXTREMITIES:  Warm without any calf tenderness, cyanosis, clubbing, or  edema.   DATA:  In-office spirometry reveals an FEV1 of 0.98L, which is 31% of  predicted, and an ambulatory pulse ox shows O2 saturations decreased at  lap 2 at 85%.   IMPRESSION AND PLAN:  Severe emphysematous  chronic obstructive pulmonary  disease now with exertional hypoxemia.  The patient had previously been  intolerant to Spiriva due to urinary retention.  I have recommended the  patient restart Advair 100/50 twice daily.  If intolerant, may be a  candidate for Symbicort.  Optimal DPI technique was reviewed.   The chest x-ray is pending at the time of this dictation.   The patient also has been set up with oxygen to be used at 2L with  activity, and will be checked on overnight oxymetry.  He will recheck  here in 2 weeks with Dr. Sherene Sires or sooner if needed.      Rubye Oaks, NP  Electronically Signed      Charlaine Dalton. Sherene Sires, MD, [facility]  Electronically Signed   TP/MedQ  DD: 05/29/2006  DT: 05/29/2006  Job #: [id number]

## 2010-05-24 NOTE — Procedures (Signed)
NAME:  [name], [name] NO.:  [id number]   MEDICAL RECORD NO.:  [id number]          PATIENT TYPE:  OUT   LOCATION:  SLEEP CENTER                 FACILITY:  [facility]   PHYSICIAN:  Barbaraann Share, MD,FCCPDATE OF BIRTH:  10[birth year]09-1931   DATE OF STUDY:  12/18/2007                            NOCTURNAL POLYSOMNOGRAM   REFERRING PHYSICIAN:  Casimiro Needle B. Sherene Sires, MD, [facility]   REFERRING PHYSICIAN:  Charlaine Dalton. Wert, MD, FCCP   INDICATION FOR STUDY:  Hypersomnia with possible sleep apnea.   EPWORTH SLEEPINESS SCORE:  11.   MEDICATIONS:   SLEEP ARCHITECTURE:  The patient had a total sleep time of 277 minutes  with no slow wave sleep and only 55 minutes of REM.  Sleep onset latency  was mildly prolonged at 35 minutes, and REM onset was very prolonged at  178 minutes.  Sleep efficiency was poor at 70%.   RESPIRATORY DATA:  The patient was found to have 7 obstructive apneas  and 8 obstructive hypopneas for an AHI of only 3 events per hour.  However, he was also noted to have 24 respiratory effort related  arousals, giving him a RDI of 8 events per hour.  The events were  clearly worse in the supine position, and there was moderate-to-loud  snoring noted throughout.   OXYGEN DATA:  The patient had O2 desaturation as low as 81% with his  obstructive events, but only spent 2 minutes the entire night less than  88%.   CARDIAC DATA:  The patient had an occasional episode of bigeminy noted,  but no clinically significant arrhythmia seen.   MOVEMENT-PARASOMNIA:  The patient was found to have 68 periodic leg  movements with less than 1 per hour resulting in arousal or awakening.   IMPRESSIONS-RECOMMENDATIONS:  1. Very mild obstructive sleep apnea with an apnea-hypopnea index of 3      events per hour and a respiratory disturbance index of 8 events per      hour.  There was oxygen desaturation as low as 81%.  Treatment for      this degree of sleep apnea can include a trial of weight loss  alone      if applicable,  upper airway surgery, oral appliance, and also      continuous positive airway pressure.  Clinical correlation is      suggested.  However, the decision to treat this mild degree of      sleep apnea should solely be based on how symptomatic the patient      is on      presentation.  2. Occasional episodes of bigeminy with no clinically significant      arrhythmia seen.      Barbaraann Share, MD,FCCP  Diplomate, American Board of Sleep  Medicine  Electronically Signed     [facility]/MEDQ  D:  12/24/2007 16:21:49  T:  12/25/2007 09:20:13  Job:  [id number]

## 2010-05-24 NOTE — Assessment & Plan Note (Signed)
OFFICE VISIT   [name], [name]  DOB:  01-11-1929                                       07/09/2007  CHART#:[id number]   I saw the patient in the office today for continued followup of his  carotid disease and peripheral vascular disease.  Since I saw him last a  year ago he has had no history of stroke, TIAs, expressive or receptive  aphasia or amaurosis fugax.  He did have one episode where he briefly  passed out but has had no further episodes.  He is able to walk for  approximately 30-40 minutes without experiencing any significant leg  pain.  He has had no rest pain and no history of nonhealing ulcers.  The  only significant change in his medical history is he does use oxygen  when exercising some.   REVIEW OF SYSTEMS:  PULMONARY:  On review of systems he admits to  dyspnea on exertion.  He has had no recent bronchitis, asthma or  wheezing.  He quit tobacco 25 years ago.  CARDIAC:  He has had no chest pain, chest pressure, palpitations or  arrhythmias.   PHYSICAL EXAMINATION:  General:  On physical examination this is a  pleasant 77 year old gentleman who appears his stated age.  Vital signs:  Blood pressure is 156/83, heart rate is 55.  I do not detect any carotid  bruits.  Lungs:  Are clear bilaterally to auscultation.  Cardiac:  He  has a regular rate rhythm.  Neurologic:  Exam is nonfocal.  Both feet  are warm and well perfused.   Arterial Doppler study in our office today shows an ABI of 68% on the  right and 57% on the left.  These are stable.  He has biphasic Doppler  signals in both feet.  Carotid duplex scan shows a bilateral 40-59%  carotid stenoses.  Both vertebral arteries are patent with normally  directed flow and arm pressures are essentially equal.   With respect to his carotid disease I have recommend a followup duplex  scan in 1 year.  We will see him back at that time.  He understands we  generally would not consider carotid  endarterectomy unless the stenosis  progressed to greater than 80%.  With respect to his peripheral vascular  disease his ABIs are stable and as he is essentially asymptomatic we  will not obtain followup ABIs unless he develops any recurrent symptoms.   [name]. Edilia Bo, M.D.  Electronically Signed   CSD/MEDQ  D:  07/09/2007  T:  07/10/2007  Job:  [id number]

## 2010-05-26 NOTE — Procedures (Unsigned)
CAROTID DUPLEX EXAM  INDICATION:  Followup carotid disease.  HISTORY: Diabetes:  No. Cardiac:  Yes. Hypertension:  Yes. Smoking:  Previous. Previous Surgery:  No. CV History: Amaurosis Fugax No, Paresthesias No, Hemiparesis No                                      RIGHT             LEFT Brachial systolic pressure:         158               130 Brachial Doppler waveforms:         WNL               WNL Vertebral direction of flow:        Antegrade         Antegrade DUPLEX VELOCITIES (cm/sec) CCA peak systolic                   67                73 ECA peak systolic                   80                86 ICA peak systolic                   163               109 ICA end diastolic                   56                38 PLAQUE MORPHOLOGY:                  Heterogeneous     Heterogeneous PLAQUE AMOUNT:                      Moderate          Mild PLAQUE LOCATION:                    CCA/ICA           CCA/ICA  IMPRESSION: 1. 40%-59% right internal carotid artery stenosis. 2. 1%-39% left internal carotid artery stenosis. 3. Bilateral vertebral arteries are within normal limits. 4. Although this represents a downgrade in disease category     bilaterally, velocities are relatively stable compared to previous     exam. 5. Of note, a brachial blood pressure gradient was observed without     evidence of subclavian stenosis.  The blood pressure was taken     multiple times.  ___________________________________________ [name]. Edilia Bo, M.D.  LT/MEDQ  D:  05/20/2010  T:  05/20/2010  Job:  [id number]

## 2010-05-27 NOTE — Op Note (Signed)
NAME:  [name], [name]                            ACCOUNT NO.:  [id number]   MEDICAL RECORD NO.:  [id number]                   PATIENT TYPE:  INP   LOCATION:  2304                                 FACILITY:  [facility]   PHYSICIAN:  Gwenith Daily. Tyrone Sage, M.D.            DATE OF BIRTH:  January 04, 1930   DATE OF PROCEDURE:  09/13/2001  DATE OF DISCHARGE:                                 OPERATIVE REPORT   PREOPERATIVE DIAGNOSIS:  Coronary occlusive disease with unstable angina.   POSTOPERATIVE DIAGNOSIS:  Coronary occlusive disease with unstable angina.   PROCEDURES:  1. Coronary artery bypass grafting x 5 with the left internal mammary to the     left anterior descending coronary artery.  2. Sequential reverse saphenous vein graft to the first and second obtuse     marginal.  3. Sequential reverse saphenous vein graft to the distal right coronary     artery and the distal posterior descending coronary  artery using     endoscopically harvested vein.   SURGEON:  Gwenith Daily. Tyrone Sage, M.D.   FIRST ASSISTANT:  Levin Erp. Steward, P.A.   BRIEF HISTORY:  The patient is a  77 year old male with new onset of angina  and was found to have significant three vessel disease by cardiac  catheterization as outlined in the consult note. The planned coronary artery  bypass grafting was recommended to the patient who agreed and signed  informed consent.   DESCRIPTION OF PROCEDURE:  With  Swann-Ganz and arterial line monitors  placed, the patient underwent general endotracheal anesthesia without  incident. The skin of the  chest and legs was prepped with Betadine and  draped in the usual sterile manner. Using the [employer] endo vein harvesting  system, a segment of right thigh vein was harvested and was of excellent  quality and caliber.   A median  sternotomy was performed and the left internal mammary was  dissected down to its pedicle graft and this artery was divided and had good  free flow. The pericardium  was opened. The overall ventricular function  appeared preserved. The patient was systemically heparinized. The ascending  aorta and the right atrium were cannulated in the aortic root and then a  cardioplegia needle was introduced into the ascending aorta.   The patient was  placed on cardiopulmonary bypass, 2.4 liters/minute per m2.  The sites of anastomosis were selected and dissected out of the  epicardium.  The patient's body temperature was cooled to 30 degrees. The aortic cross  clamp was applied and 500 cc of cold blood potassium cardioplegia was  administered with resulting diastolic arrest of the heart.  Myocardial  septal temperature was monitored throughout the cross clamp.   Attention was turned first to the OM1 and OM2 vessels. Using a short,  natural wire, an end-to-side anastomosis was carried out with the first  obtuse marginal. Both of these vessels were  diffusely diseased and  anastomoses were made fairly far distally on the vessels. The distal extent  of the  same vein was then carried to the second obtuse marginal which was  opened, and was approximately 1.4 cm in  size and also diffusely diseased.  Using a running 7-0 Prolene a distal anastomosis was performed.   Attention was then turned to the very distal right coronary artery and the  proximal posterior descending. The distal right coronary was opened and  admitted a 1.5 mm probe proximally and distally, a short distance into the  posterior descending, but in the midportion of the  posterior descending was  obvious further plaque. Using a longitudinal side-to-side, a tight  anastomosis was carried out with the distal right coronary artery and the  distal extent of the  same vein was then carried onto the distal posterior  descending coronary artery which was opened and was a small vessel but  admitted a 1 mm probe. Using a running 7-0 Prolene, a distal  anastomosis  was performed.   Attention was then turned to  the left anterior descending coronary artery,  which was opened in the midportion of the  vessel using a running 8-0  Prolene. The left internal mammary artery was anastomosed to the left  anterior descending coronary. With the release of the bulldog on the mammary  artery there was appropriate rise in myocardial septal temperature. The  aortic cross clamp was removed.  Total cross clamp time of 61 minutes.  The  patient required electric defibrillation to return to a sinus rhythm.   A partial occlusion clamp was placed on the ascending aorta. Two punch  aortotomies were performed, each of the  two vein grafts were anastomosed to  the ascending aorta. Air was evacuated from the grafts and the partial  occlusion clamp was removed.  The sites of anastomosis were inspected and  were free of bleeding.   The patient was ventilated and weaned from the cardiopulmonary bypass  without difficulty. He remained hemodynamically stable. He was  decannulated  in the usual fashion. Protamine sulfate was administered with the operative  field hemostatic. Two atrial tube ventricular pacing wires were applied.  Graft marks were applied. The left pleural tube, two mediastinal tubes were  left in place.   The sternum was closed with #6 stainless steel wire. The fascia was closed  with interrupted 0 Vicryl and running 3-0 Vicryl for the subcutaneous tissue  for a subcuticular stitch. Dry dressings were applied.   Sponge and needle counts were reported as correct at the completion of the  procedure.  The patient tolerated the procedure without obvious complication  and was transferred to the surgical the intensive care unit for further  postoperative care.                                               Gwenith Daily Tyrone Sage, M.D.    Tyson Babinski  D:  09/15/2001  T:  09/16/2001  Job:  [id number]   cc:   Lesleigh Noe, M.D.  [address]  [address]  [place]  [place] [address]  Fax: [phone]

## 2010-05-27 NOTE — Op Note (Signed)
   NAME:  [name], [name]                            ACCOUNT NO.:  [id number]   MEDICAL RECORD NO.:  [id number]                   PATIENT TYPE:  INP   LOCATION:  2304                                 FACILITY:  [facility]   PHYSICIAN:  Gwenith Daily. Tyrone Sage, M.D.            DATE OF BIRTH:  01/08/1930   DATE OF PROCEDURE:  09/13/2001  DATE OF DISCHARGE:                                 OPERATIVE REPORT   PREOPERATIVE DIAGNOSIS:  Bleeding, status post coronary artery bypass  grafting.   POSTOPERATIVE DIAGNOSIS:  Bleeding, status post coronary artery bypass  grafting.   PROCEDURE:  Mediastinal exploration for postoperative bleeding.   SURGEON:  Gwenith Daily. Tyrone Sage, M.D.   BRIEF HISTORY:  The patient is a 76 year old male who earlier in the day had  undergone coronary artery bypass grafting.  Postoperatively he in spite of  collected coagulation studies continued to bleed approximately 200 cc/hr.  from his mediastinal tubes.  Because of the concern of continued bleeding,  early re-exploration was recommended and discussed with the patient's wife,  and he was taken back to the operating room still sedated from his initial  surgery.   DESCRIPTION OF PROCEDURE:  The skin of the chest and legs was prepped with  Betadine and draped in the usual sterile manner.  The previous incision was  opened.  A small amount of blood clot under the sternum was removed.  The  patient had diffuse oozing on bony edges and from the areas of mediastinal  fat.  There was very little blood actually in the pericardial space.  The  pericardium was reopened and the grafts were each inspected and were free of  any bleeding sites, and all the grafts appeared to be patent.  With the  operative field hemostat at the end, the pericardium was loosely  reapproximated.  A left pleural tube and two mediastinal tubes were left in  place.  The sternum was closed with #6 stainless steel wire, the fascia  closed with interrupted 0  Vicryl, running 3-0 Vicryl for the subcutaneous  tissue, 4-0 subcuticular stitch in the skin edges.  Dry dressings applied.  Sponge and needle count was reported as correct at completion of the  procedure.  The patient tolerated the procedure without obvious complication  and was transferred to the surgical intensive care unit for further  postoperative care.                                               Gwenith Daily Tyrone Sage, M.D.    Tyson Babinski  D:  09/15/2001  T:  09/16/2001  Job:  [id number]   cc:   Lesleigh Noe, M.D.  [address]  [address]  [place]  [place] [address]  Fax: [phone]

## 2010-05-27 NOTE — Cardiovascular Report (Signed)
NAME:  [name], [name] NO.:  [id number]   MEDICAL RECORD NO.:  [id number]                   PATIENT TYPE:  OIB   LOCATION:  2899                                 FACILITY:  [facility]   PHYSICIAN:  Lesleigh Noe, M.D.            DATE OF BIRTH:  May 13, 1929   DATE OF PROCEDURE:  09/12/2001  DATE OF DISCHARGE:                              CARDIAC CATHETERIZATION   INDICATION:  Recent angina, early positive stress Cardiolite showing  anterior ischemia.   PROCEDURE PERFORMED:  1. Left heart catheterization.  2. Selective coronary angiogram.  3. Left ventriculography.   DESCRIPTION OF PROCEDURE:  After informed consent, a 6 French sheath was  placed in the right femoral artery using a modified Seldinger technique. A 6  French A2 multipurpose catheter was used for hemodynamic recordings, left  ventriculography, and selective left coronary angiography. A JR4 was used  for right coronary angiography (6 [place]). At completion of the procedure,  hemostasis was achieved by sheath pull. The patient tolerated the procedure  without complications, although he did have several episodes of chest  discomfort that eventually required IV Lopressor and IV nitroglycerin for  control. He leaves the room pain-free.   RESULTS:  I:  HEMODYNAMIC DATA:  a.  Aortic pressure 169/62.  b.  Left ventricular pressure 167/15.   II:  LEFT VENTRICULOGRAPHY:  Overall LV function is normal.  Mild suggestion  of mid inferior wall hypokinesis. EF 60%.   III:  CORONARY ANGIOGRAPHY:  a.  Left main coronary artery:  Widely patent.  b.  Left anterior descending coronary:  The LAD is calcified. There is a  proximal 95-99% stenosis that I am sure is probably the culprit for most of  the patient's recent symptoms and the abnormality noted on Cardiolite. The  mid vessel beyond the first diagonal contains a segmental calcified 85%  stenosis.  c.  Circumflex artery:  Gives origin to two  dominant obtuse marginal  branches. Each limb of this bifurcation contains at least 85% stenosis. The  mid circumflex also has 85% at this bifurcation.  d.  Right coronary:  The right coronary is heavily calcified. A range of 70-  90% stenoses are noted throughout the midportion of the vessel. The PDA  contains 95% stenosis proximally and distally.   CONCLUSION:  1. Severe diffuse three-vessel coronary artery disease involving the left     anterior descending, circumflex and right coronary. The coronaries are     heavily calcified.  2. Overall normal left ventricular function.    PLAN:  1. Keep in the hospital until bypass surgery.  2. Consult CVTS for surgery to be done hopefully within the next 24 hours.  Lesleigh Noe, M.D.    HWS/MEDQ  D:  09/12/2001  T:  09/13/2001  Job:  [id number]   cc:   Kirkland Hun. Pail, M.D.   Gwenith Daily Tyrone Sage, M.D.  [address]  [place]  [place] [address]  Fax: [phone]

## 2010-05-27 NOTE — Consult Note (Signed)
NAME:  [name], [name]                            ACCOUNT NO.:  [id number]   MEDICAL RECORD NO.:  [id number]                   PATIENT TYPE:  INP   LOCATION:  2304                                 FACILITY:  [facility]   PHYSICIAN:  Gwenith Daily. Tyrone Sage, M.D.            DATE OF BIRTH:  1929/04/19   DATE OF CONSULTATION:  DATE OF DISCHARGE:                                   CONSULTATION   PHYSICIANS:  1. Requesting physician, Dr. Katrinka Blazing.  2. Primary care physician  Dr. Annice Pih.   REASON FOR CONSULTATION:  Coronary occlusive disease with new onset of  angina.   HISTORY OF PRESENT ILLNESS:  The patient is a 77 year old male with known  peripheral vascular disease, who notes approximately one month prior to  admission while watching a movie, onset of substernal pain without nausea or  radiation, spontaneously  resolved. Over this several months he has had  increasing episodes, occasionally nocturnal, associated with mild shortness  of breath and chest tightness.  Because of these symptoms  a stress test was  recommended which was positive, and the patient was referred for cardiac  catheterization.   PAST CARDIAC HISTORY:  The patient has had no previous history of myocardial  infarction and no prior history of cardiac surgery. A Cardiolite stress test  in 2000 was interpreted as normal. Cardiac risk factors, the patient has  borderline hypertension, has known hyperlipidemia. Denies diabetes. He does  have a positive family history. His father died at age 15 of MI. Mother was  known to have hypertension. The patient has a remote history of smoking, 30  to 40 pack years, having  quit in 1983. He has one brother who has bypass  surgery. He has no previous history of stroke. He does have known  peripheral vascular disease, followed by Dr. Edilia Bo. He has no renal  insufficiency.   PAST MEDICAL HISTORY:  Significant for:  1. History of kidney stones with lithotripsy x 2.  2. Benign prostatic  hypertrophy.  3. No previous surgery.   SOCIAL HISTORY:  The patient is married, has two children, one son is a  Marine scientist in [place]. The patient is retired.   MEDICATIONS:  Medications at the time of admission included:  1. Lipitor.  2. Aspirin.  3. Vitamin E.  4. Multivitamins.   ALLERGIES:  DRUG ALLERGIES  INCLUDE SULFA; APPROXIMATELY 30 YEARS AGO HE  NOTED FACE AND LIP EDEMA.   REVIEW OF SYMPTOMS:  CARDIAC:  Significant for chest pain, resting shortness  of breath, palpitations and exertion with shortness of breath. Denies lower  extremity edema, syncope, presyncope or orthopnea.  GENERAL: The patient has  had no constitutional symptoms such as fever or chills.  RESPIRATORY: Has a  history of allergic.  GASTROINTESTINAL:  Was  noted to have peptic ulcer  disease in the 1970s but no symptoms lately. He had an episode of severe  diarrhea for two months after a course of antibiotic therapy. Question of C.  difficile.  Denies any TIAs or strokes. MUSCULOSKELETAL:  Notes painful  joints, especially in his hips and low back pain. Also neck and hand  arthritis. GU: History of kidney stones and dysuria,  followed by Dr.  Aldean Ast.  IMMUNE:  No recent infections. The patient does note easy  bruising. Denies any other symptoms of endocrine.  PSYCHIATRIC:  He has a  history of mild depression.  VASCULAR:  He does have peripheral vascular disease with a history of  bilateral claudication which is improved with recent walking.   PHYSICAL EXAMINATION:  VITAL SIGNS:  Blood pressure 130/65, pulse 58,  respiratory rate 18, O2 saturations 98%.  GENERAL:  He is 5 feet, 10 inches, 156 pounds.  In general the patient  appears his stated age in no distress.  HEENT:  Pupils equally round and reactive to light.  NECK:  Without carotid bruits.  CHEST:  Slight decrease in breath sounds but without active wheezing.  CARDIAC:  Regular rate and rhythm without murmur or gallop.  ABDOMEN:  Benign  without palpable masses.  EXTREMITIES:  The patient does have evidence of distal peripheral vascular  disease with slight thinning of the  skin. He has decreased pulses with just  +1 DP and PT pulses bilaterally.  LYMPH:  He has no palpable lymph nodes in the supraclavicular, axillary or  inguinal area.   LABORATORY DATA:  Hemoglobin 16.4, hematocrit 47, platelet count 189,000.  Potassium 5.3, creatinine 0.9.   An EKG shows incomplete bundle branch block with poor R-wave progression. A  chest x-ray is consistent with COPD. A cardiac catheterization was performed  with an ejection fraction of approximately 55%. The LAD has a proximal 98%  and a mid 85%. The circumflex has greater than 80% in the first and second  obtuse marginal. The right coronary artery has diffuse disease with 70% to  90% stenosis and distal 95% stenosis in the PD.   IMPRESSION:  A 77 year old male with new onset of exertional and at times  rest angina, including on the day of admission. Because of the progressive  nature of the patient's symptoms and his pain on admission prior to  outpatient catheterization, I agree with recommendation for coronary artery  bypass grafting with the patient's significant three vessel disease. I have  discussed with the patient the risks of surgery including the risks of  death, infection,  stroke, myocardial infarction, bleeding and blood  transfusions. He is willing to proceed and will plan to proceed with  coronary artery bypass grafting on September 13, 2001.                                               Gwenith Daily Tyrone Sage, M.D.    Tyson Babinski  D:  09/15/2001  T:  09/17/2001  Job:  [id number]   cc:   Lyn Records III, M.D.  [address]  [address]  [place]  [place] [address]  Fax: [phone]   Dr. Annice Pih

## 2010-05-27 NOTE — Discharge Summary (Signed)
NAME:  [name], [name]                            ACCOUNT NO.:  [id number]   MEDICAL RECORD NO.:  [id number]                   PATIENT TYPE:  INP   LOCATION:  2033                                 FACILITY:  [facility]   PHYSICIAN:  Gwenith Daily. Tyrone Sage, M.D.            DATE OF BIRTH:  1929/07/10   DATE OF ADMISSION:  09/12/2001  DATE OF DISCHARGE:  09/23/2001                                 DISCHARGE SUMMARY   DISCHARGE DIAGNOSES:  1. Atherosclerotic coronary artery disease with unstable angina.  2. Acute blood loss anemia postoperatively requiring transfusion one pack of     platelets, 1 unit of fresh frozen plasma, and 1 unit of packed red blood     cells intraoperatively, 1 further unit of packed red blood cells on     postoperative day number two.  3. Volume overload some 15 pounds, improving with continued persistent     diuresis.  4. Mild verbal apraxia postoperatively with neurologic consult postoperative     day number seven.  Started on aspirin and Plavix.  Symptoms resolving by     postoperative days eight and nine.  A modified barium swallow was ordered     which was normal.   SECONDARY DIAGNOSES:  1. Infrainguinal arterial occlusive disease.  Ankle brachial indexes on     September 9 were on the right 0.65, on the left 0.74.  2. Extracranial cerebrovascular occlusive disease with a 50-79% right     internal carotid artery stenosis, a 50% left internal carotid artery     stenosis.  3. Hypercholesterolemia.  4. History of nephrolithiasis, lithotripsy x2.  5. Benign prostatic hypertrophy.  6. Family history of coronary artery disease.   PROCEDURE:  1. September 12, 2001:  Left heart catheterization, left ventriculogram, and     coronary angiography by Dr. Verdis Prime.  The study shows an ejection     fraction of 50%.  A left anterior descending coronary artery had a     proximal 98%, mid point 85%.  The left circumflex has a greater than 80%     stenosis in the first and  second obtuse marginal.  The right coronary     artery has diffuse disease with 70-90% stenosis and a distal 95% stenosis     in the posterior descending.  2. September 12, 2001:  Carotid duplex ultrasound showing a right 60-80%     internal carotid artery stenosis and a left 40-60% internal carotid     artery stenosis.  Ankle brachial indexes on the right 0.65, on the left     0.74.  3. Coronary artery bypass graft surgery x5 on September 13, 2001 and this     procedure by Dr. Tyrone Sage.  The left internal mammary artery was     connected in end-to-side fashion to the left anterior descending coronary     artery.  A reversed sequential saphenous vein  graft was fashioned from     the aorta to the distal right coronary artery and then to the distal     posterior descending.  A sequential reverse saphenous vein graft was     fashioned from the aorta to the first obtuse marginal, then to the second     obtuse marginal.  The patient was transferred in stable, satisfactory     condition to intensive care unit.  However, patient was brought back to     the operating room to re-explore for bleeding the same day.  No disposing     cause could be found for his bleeding.  He was returned to intensive care     unit.   The patient was extubated on postoperative day number one.  He was found to  be volume overloaded, hypotensive, maintained on intravenous dopamine.  On  postoperative day number two he was transfused with 1 units of packed red  blood cells for a hemoglobin of 8.4.  His hemoglobin subsequently rose to  9.5 and has been stable since.  Due to hypotension postoperative beta  blocker and Lasix were held until postoperative day number four with his  blood pressure normalized.  At that time diuresis was started and continued  throughout his postoperative course with accelerating diuresis and finally a  good auto diuresis occurring postoperative days number seven, eight, and  nine.   Postoperatively he did have some mild verbal apraxia.  He was unable  to pronounce clearly blood pressure, sometimes had trouble finding words.  A  neurologic evaluation was obtained.  The patient did not have any focal  deficits.  No history of TIAs.  It is suspected that he had a lacunar  infarct in the pons or the internal capsule.  There was a recommendation of  observation and starting on Plavix and aspirin which was done.  If symptoms  persisted he would undergo MRI or MRA.  He also had difficulty swallowing  postoperatively and on postoperative day number seven a speech pathology  consult was obtained as well.  A modified barium swallow was done which was  within normal limits.  He was placed, however, on a soft diet with  aspiration precautions and has tolerated that well.  Throughout the  postoperative period he has been ambulating well, although he has  desaturated.  His chest x-ray showed cephalization and a left pleural  effusion.  The cephalization has improved as diuresis has continued and he  was removed of all supplemental oxygen by postoperative day number nine.  He  does feel subjectively as if he is having some suffocating symptoms at that  time, however, his oxygen saturations are about 93%, his heart rate is in  the 80s-90s.  The patient's incisions are healing nicely in the  postoperative period.  There is no evidence of erythema or drainage.  His  pain is controlled well with Percocet which he seems to avoid taking, but  does well with this when he undertakes to maintain his pain analgesia.   DISCHARGE MEDICATIONS:  1. Percocet 5/325 one to two tablets p.o. q.4-6h. p.r.n. pain.  2. Folic acid 1 mg daily.  3. Lipitor 10 mg daily at bedtime.  4. Toprol XL 25 mg daily.  5. Lasix 40 mg daily.  6. Potassium chloride 20 mEq daily.  7. Enteric-coated aspirin 81 mg daily.  8. Plavix 75 mg daily.  9. Protonix 30 mg daily.  DISCHARGE ACTIVITY:  Walk daily to keep up  his  strength.  He is asked not to  drive nor lift any weight more than 10 pounds for the next six weeks.   DISCHARGE DIET:  Low sodium, low cholesterol diet.   WOUND CARE:  The patient may shower daily.   FOLLOW UP:  He has an office visit to follow-up with Dr. Katrinka Blazing, his  cardiologist, September 29 at 2:45.  He will see Dr. Tyrone Sage in the office  in three weeks bringing his chest x-ray with him taken at the cardiologists'  office.  This will be arranged.   BRIEF HISTORY:  The patient is a 77 year old male with known peripheral  vascular disease.  One month ago he had onset of substernal chest pain while  watching a movie.  There was no nausea or radiation and the pain  spontaneously resolved.  He has since had increasing episodes, occasionally  nocturnal.  These are associated with mild shortness of breath and chest  tightness.  A stress test was recommended.  This was positive.  The patient  was referred for cardiac catheterization.   HOSPITAL COURSE:  The patient presented to [facility] on September  4.  He underwent left heart catheterization at that time.  The study showed  severe three vessel atherosclerotic coronary artery disease with preserved  left ventricular function.  Dr. Sheliah Plane was consulted.  He  recommended revascularization as best therapy to avoid risk of sudden death  or myocardial infarction with damage to the heart.  The patient understood  the risks and benefits and elected to undergo the procedure.  Prior to  surgery carotid Doppler ultrasound was taken as well as ankle brachial  indexes.  These studies have been dictated above.  The patient underwent  surgery on September 5.  Five bypasses were placed.  He was taken back to  the operating room the same day for reexploration for persistent bleeding.  His postoperative course has lasted nine days and has been described in the  discharge disposition.  The patient is discharged improved and stable.   He  has not had any cardiac dysrhythmias postoperatively and has not really had  pulmonary decompensation.  He has been seen by neurologist, Dr. Sharene Skeans, in  consultation and was recommended to be started on Plavix as well as his  postoperative aspirin.  He also underwent an abbreviated study for sleep  apnea.  He had pulse oximetry applied overnight with hourly readings to  document if he indeed desaturated when asleep.  The results of this study  are pending and if significant they will be forwarded for further study.     Maple Mirza, P.A.                    Gwenith Daily Tyrone Sage, M.D.    GM/MEDQ  D:  09/22/2001  T:  09/23/2001  Job:  [id number]   cc:   Lesleigh Noe, MD  [address]  [address]  [place]  [place] [address]  Fax: [phone]   Kirkland Hun. Tery Sanfilippo, M.D.  [address] [place]  [place] [address]  Fax: [phone]   [name]. Edilia Bo, M.D.  [address]  [place]  [place] [address]  Fax: [phone]

## 2010-05-27 NOTE — Consult Note (Signed)
NAME:  [name], [name]                            ACCOUNT NO.:  [id number]   MEDICAL RECORD NO.:  [id number]                   PATIENT TYPE:  INP   LOCATION:  2033                                 FACILITY:  [facility]   PHYSICIAN:  Deanna Artis. Sharene Skeans, M.D.           DATE OF BIRTH:  Aug 07, 1929   DATE OF CONSULTATION:  DATE OF DISCHARGE:                                   CONSULTATION   CHIEF COMPLAINT:  Dysphagia.   HISTORY OF THE CONDITION:  The patient is a 77 year old gentleman seen with  coronary occlusive disease with new onset of angina.  The patient had a  substernal pain for a month prior to admission and several months history of  increasing nocturnal dyspnea and chest tightness.  The patient had a  positive stress test and had an abnormal cardiac catheterization.  He  underwent a coronary artery bypass graft times five and had to go back to  the operating room (because of bleeding) treated.  The patient, thus was  intubated twice.  In the aftermath, the patient's voice has been hoarse and  he has had significant dysphagia particularly for swallowing pills.   He was seen by speech therapy today who noted that he had some difficulty  with initiating his syringeal swallow and a mild residual was in his  valleculae with pudding and cracker which cleared with multiple swallows.  The patient did not show definite aspiration or penetration of laryngeal  vestibule.   This afternoon, the medical student [employer], who evaluated this patient  for me felt that the patient was having difficulty speaking certain phrases,  that he knew what he wanted to say, but could not find the right words.  He  said that he could not eat and breathe at the same time.  His soft palate  and tongue felt full.  As the day has gone on, the patient has seemed to do  better and I was unable to appreciate any evidence of an expressive language  disorder.   PAST MEDICAL HISTORY:  Remarkable for unstable  angina as noted above,  claudication of his legs from severe peripheral vascular disease,  hypercholesterolemia, benign prostatic hypertrophy, kidney stones, mild  depression, chronic tinnitus with left hearing loss, and easy bruisability.   REVIEW OF SYSTEMS:  Positive for arthritis in his neck, hands, hips, and low  back.  Review is otherwise negative except as noted above.   PAST SURGICAL HISTORY:  None.  The patient has had lithotripsy times 2 for  kidney stones.   MEDICATIONS:  Lipitor, Aspirin, Vitamin E, Multivitamins.   DRUG ALLERGIES:  SULFA WHICH CAUSED FACE AND LIP EDEMA 30 YEARS AGO.   FAMILY HISTORY:  Positive for a death in his father of myocardial infarction  at age 30, mother had hypertension.  Maternal grandfather died early of a  possible heart attack.  The patient's brother has had bypass  surgery,  another brother had a stroke in his 79's.   SOCIAL HISTORY:  The patient is married with two children.  He quit smoking  in 1983.  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  He has a total of a 30 pack-year  history.  He uses alcohol on a steady, but fairly limited intake.   PHYSICAL EXAMINATION:  GENERAL:  A well-developed, well-nourished, pleasant,  right-handed gentleman in no acute distress.  VITAL SIGNS:  Temperature 98.1, blood pressure 146/70, resting pulse 85,  respirations 21, pulse oximetry 92% on room air.  HEAD, EYES, EARS, NOSE, AND THROAT:  No signs of infection.  The patient has  soft bruits, right more so than left.  No signs of  meningismus.  LUNGS:  Clear to auscultation.  HEART:  No murmurs.  He has midline sternotomy scar that is healing quite  well.  ABDOMEN:  Soft, nontender.  Bowel sounds normal.  EXTREMITIES:  Showed slight pitting edema in the lower extremities, right  greater than left.  SKIN:  Shows increasing bruising on his arms.  He has a graft site of the  right calf that is healing well.  NEUROLOGIC:  The patient is awake, alert, and kind of  appropriate.  No  dysphasia, dyspraxia.  He does not seem to have word finding problems.  He  has no difficulty following commands, repeating phrases, naming objects.  Cranial nerves, round reactive pupils, visual fields full to double  simultaneous stimuli.  Extraocular movements full and constant.  _____________ responses equal bilaterally.  Symmetric sensation and  strength.  Good bite, tongue, and uvula.  Air conduction is greater than  bone conduction, but he does seem to hear better in the right ear than the  left.  Motor examination:  Normal strength, tone and mass.  Good fine motor  movements.  No pronator drift.  He opposes thumb and fingers quite well.  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  Stereognosis was intact.  Gait was slow,  minimally broad based.  He can get up on his toes and heels right.  Romberg  was negative.  Deep tendon reflexes are absent.   IMPRESSION:  1. Dystonia, mild dysarthria 784.5.  2. Dysphagia 781.3.  I do not see signs of aphasia.   The patient may have had a lacunar infarction in the subcortical.  It is  also possible that he has some damage to his vocal cords, although, that  would not have anything to do with his swallowing.   I do not think that we are going to find a clear path etiology with his CT  imaging.  The patient is extremely claustrophobic and will not tolerate  anything other than being unconscious during the procedure, which would  require Versed.  I think we should get further from his operation before  that is done and really cannot schedule it until Monday anyhow.  I will  discuss this with Dr. Tyrone Sage and we will set this up if the patient  continues to have difficulty.  I feel good about the speech therapy  evaluation and believe that if there is a problem, that it is a small one and nearly probably has no treatment other than continuing Aspirin and  Plavix  and speech therapy.  I appreciate the opportunity to see the patient,  if you have questions or if I can be of assistance, do not hesitate to  contact  me.                                                   Deanna Artis. Sharene Skeans, M.D.    [facility]  D:  09/20/2001  T:  09/23/2001  Job:  [id number]   cc:   Gwenith Daily. Tyrone Sage, M.D.  [address]  [place]  [place] [address]  Fax: [phone]

## 2010-05-27 NOTE — Consult Note (Signed)
NAME:  [name], [name]                            ACCOUNT NO.:  [id number]   MEDICAL RECORD NO.:  [id number]                   PATIENT TYPE:  OIB   LOCATION:  4736                                 FACILITY:  [facility]   PHYSICIAN:  [name]. Edilia Bo, M.D.        DATE OF BIRTH:  25-Jul-1929   DATE OF CONSULTATION:  09/12/2001  DATE OF DISCHARGE:                                   CONSULTATION   REASON FOR CONSULTATION:  Bilateral carotid disease and peripheral vascular  disease.   HISTORY OF PRESENT ILLNESS:  This is a pleasant 77 year old gentleman who is  well-known to me.  I had been following him in the office with claudication.  His symptoms had improved with tobacco cessation and a structured walking  program.  In addition, I had been following moderate carotid disease.  He  had been having chest pain over the last month and underwent a cardiac  catheterization today which showed multivessel coronary artery disease.  He  is admitted now for coronary revascularization.  His preoperative Doppler  showed a 50 to 79% right carotid stenosis and an approximately 50% left  carotid stenosis.  In addition, he had monophasic Doppler signals in both  feet with an ABI of 65% on the right and 74% on the left.  Vascular surgery  was consulted preoperatively for further recommendations.   The patient denies any previous history of stroke, TIAs, expressive or  receptive aphasia, or amaurosis fugax.  With respect to his claudication, he  had had a history of bilateral calf claudication, but his symptoms had  improved with a structured walking program.  He is now walking approximately  two miles a day without any significant pain.  He only experiences symptoms  when he walks up a grade.  He has had no rest pain and no history of  nonhealing ulcers.   PAST MEDICAL HISTORY:  Significant for hypercholesterolemia.  He denies any  history of hypertension or diabetes.  He has had no previous history  of  myocardial infarction or congestive heart failure.   SOCIAL HISTORY:  He is married and has two children.  He quit smoking in  1983.   FAMILY HISTORY:  His father had a heart attack at age 48.  There is no other  history of premature cardiovascular disease.   REVIEW OF SYSTEMS:  He does admit to dyspnea on exertion.  He has had no  PND.  He has had chest pain over the last month.  He has had no significant  palpitations.  He does have a history of prostatitis and a history of  arthritis.  Review of systems is otherwise negative.   PHYSICAL EXAMINATION:  VITAL SIGNS:  Blood pressure 126/80, heart rate 68.  NECK:  I do not detect any carotid bruits.  There is no cervical  lymphadenopathy.  LUNGS:  Clear bilaterally to auscultation.  HEART:  He has a regular  rate and rhythm.  ABDOMEN:  Soft and nontender.  EXTREMITIES:  He has palpable femoral pulses and a weakly palpable left  popliteal pulse.  I cannot palpate a right popliteal pulse.  He has  monophasic Doppler signals in both feet.  There are no ischemic ulcerations.  He has no significant lower extremity swelling.  NEUROLOGY:  Nonfocal.   I have explained to the patient and his family that as he has only mild  carotid disease bilaterally, I do not think he is at increased risk of  stroke around the time of his carotid surgery.  He does understand that  there is a small risk of stroke with coronary revascularization even with  normal carotid arteries.  Although, he has diminished ABIs, he should heal  his wounds well, although, I would try to avoid incisions in the distal legs  if at all possible.  I will get him back on his q.6 month carotid Duplex  scan protocol and am going to see him back in the office.  He does know to  continue taking his aspirin.   Thanks for allowing me to participate in this nice patient's care.                                               [name]. Edilia Bo, M.D.    CSD/MEDQ  D:   09/12/2001  T:  09/14/2001  Job:  [phone]

## 2010-06-27 ENCOUNTER — Telehealth: Payer: Self-pay | Admitting: Internal Medicine

## 2010-06-27 MED ORDER — CANDESARTAN CILEXETIL 8 MG PO TABS: 8.0000 mg | ORAL_TABLET | Freq: Every day | ORAL | Status: DC

## 2010-06-27 NOTE — Telephone Encounter (Signed)
Called spoke with patient who reports his insurance will not pay for his atacand - notice sent to MW, pt was unaware of this .  Pt wants to take / pay for atacand out of pocket rather than take the alternative.  Pt states that [facility] is "confused" and is having difficulty dispensing his atacand for the full 30days.  Verified dose and directions = atacand 8mg  1 po qd.  [facility], spoke with pharmacist Thayer Ohm who verifies that yes, pt was given 15days of his atacand on 6.13.12 (last refill) but was unable to explain why.  i advised Thayer Ohm that pt needs the full 30days and that pt is aware insurance will not pay for this and will pay out of pocket.  1 year's worth of refills given over the phone as well.  Thayer Ohm verbalized his understanding.  Called spoke with patient, he is aware.  Insurance will also not pay for the xopenex hfa - would like alternative for this medication.  Pt is okay to wait until Wednesday for MW to return to the office as he has enough xopenex to last until then.  Dr Sherene Sires please advise if patient may be changed to albuterol rescue inhaler, thanks.

## 2010-06-29 NOTE — Telephone Encounter (Signed)
Ok to use atacand if he wants but there should be other alternatives on his preferred list we could review at next ov  Change xopenex to proaire hfa 2 puffs every 4 hours if needed

## 2010-06-29 NOTE — Telephone Encounter (Signed)
Pt returned call.  Advised pt of MW's recs.  Pt requests that the proair not be filled by the pharmacy but placed on hold.  This has been telephoned to the pharmacy with the note as requested by patient.  Will sign off on message.

## 2010-06-29 NOTE — Telephone Encounter (Signed)
[email or web address].Jaymen Fetch, CMA  

## 2010-07-08 ENCOUNTER — Other Ambulatory Visit: Payer: Self-pay | Admitting: Internal Medicine

## 2010-07-21 ENCOUNTER — Other Ambulatory Visit: Payer: Self-pay | Admitting: Internal Medicine

## 2010-10-14 ENCOUNTER — Ambulatory Visit (INDEPENDENT_AMBULATORY_CARE_PROVIDER_SITE_OTHER): Payer: Medicare Other | Admitting: Internal Medicine

## 2010-10-14 ENCOUNTER — Encounter: Payer: Self-pay | Admitting: Internal Medicine

## 2010-10-14 VITALS — BP 140/82 | HR 80 | Temp 97.6°F | Ht 68.0 in | Wt 154.6 lb

## 2010-10-14 DIAGNOSIS — J449 Chronic obstructive pulmonary disease, unspecified: Secondary | ICD-10-CM

## 2010-10-14 MED ORDER — PREDNISONE (PAK) 10 MG PO TABS: ORAL_TABLET | ORAL | Status: AC

## 2010-10-14 NOTE — Progress Notes (Signed)
Subjective:    Patient ID: [name], male    DOB: [birth year], 77 y.o.   MRN: [id number]  HPI Summary: Pulmonary/ f/u ov try astepro for pnds   Primary Provider/Referring Provider: [name]  yowm last smoked 1983 severe COPD FEV1 of only 36% April 2007 with a 29% improvement after bronchodilators consistent with an asthmatic component. Baseline activity level--walk 1 mile daily with oxygen on. Maintained on Symbicort 160/4.29mcg 1 puff two times a day.   February 11, 2008 ov never took spiriva consistently. No variability to doe or sign cough. rec spiriva but w/in one week cp > MCH with neg cardiac wu, still having occ cp , positional, better with rubbing chest and relaxing.  Retry spiriva   October 15, 2008 rov follow up, about the same did not get along well with spiriva , discont. taking caused dry mouth. still definitely benefits from as needed xopenex.    February 18, 2010 -- ov cc cough with green/white mucus, wheezing, SOB x5days. OTC not helping. Cough is worse at night, keeping him up at times. Wheezing started yesterday. rex doxy, prednisone then admit 2/14 to [facility] and d/c 2/17 with ct c/w emphysema > d/c on zmax and prednisone tapered off as of March 03, 2010   March 03, 2010 ov cc  minimally improved sob and cough with green sputum and nasal congestion esp in am .. doex > slow adls  minimally better p nebs rec  See Patient Care Coordinator before leaving for sinus CT  Thickening only  Prilosec otc 20mg  30 min before bfast and zantac 150 at bedtime as long as coughing ( reflux is to cough what oxygen is to fire)  Augmentin 875 mg twice daily with water and eat yogurt for lunch  Prednisone 10mg  2 daily back to normal then 1 daily until return.   March 17, 2010 ov Cough and Dyspnea- some better, mucus is now white and less volume worse lying down, less need for rescue.  rec taper off prednisone >  5 days off  04/28/2010 ov/Revin Corker no worse off prednisone,  Rare astepro, no  need for rescue or even using the atrovent/xop more than twice dailly - overall much better.   rec Ipatropium / xopenex are twice regulary with ability to increase to 4 x daily if breathing worsens   10/14/2010 f/u ov/Tawyna Pellot cc Pt states breathing worse with exertion x 1 month. Still has no cough but lots of mucus esp in am with  No purulent sputum. ent wants to try ipatropium and scopalamine to dry up his drippy nose - no longer following med calendar for action plan at bottom   Sleeping ok without nocturnal  or early am exacerbation  of respiratory  c/o's or need for noct saba. Also denies any obvious fluctuation of symptoms with weather or environmental changes or other aggravating or alleviating factors except as outlined above   Pt denies any significant sore throat, dysphagia, itching, sneezing,  nasal congestion or excess/ purulent secretions,  fever, chills, sweats, unintended wt loss, pleuritic or exertional cp, hempoptysis, orthopnea pnd or leg swelling.        Past Medical History:  CHRONIC RHINITIS (ICD-472.0)  - Sinus ct March 03, 2010 > thickening only, no acute changes  CHRONIC RESPIRATORY FAILURE (ICD-518.83)  - 02 rx since 2008  COPD UNSPECIFIED (ICD-496)....................................................................[name]  - PFTs 05/04/05 FEV1 36% ratio 28% with 29% improvement after bronchodilators DLCO 48%  - PFT's 03/26/08 30% ratio 34 with 14%  improvement after bronchodilators DLC0 38 %  - Start noct 02 at 2lpm 2008 and on 03/04/09 desat @ > 185 ft so rec wear with activtiy > rm to rm  - [facility] June 30, 2008 100% > confirmed March 04, 2009  ISCHEMIC HEART DISEASE (ICD-414.9)  HYPERTENSION (ICD-401.9)  HYPERLIPIDEMIA (ICD-272.4)  SPN RUL by cxr August 19, 2009  COMPLEX MED REGIMEN-Meds reviewed with pt education and computerized med calendar 11/22/09               Review of Systems     Objective:   Physical Exam  amb wm nad wt 159 04/28/2010  >  10/14/2010  154  HEENT mild turbinate edema.  Oropharynx no thrush or excess pnd or cobblestoning.  No JVD or cervical adenopathy. Mild accessory muscle hypertrophy. Trachea midline, nl thryroid. Chest was hyperinflated by percussion with diminished breath sounds and moderate increased exp time without wheeze. Hoover sign positive at mid inspiration. Regular rate and rhythm without murmur gallop or rub or increase P2 or edema.  Abd: no hsm, nl excursion. Ext warm without cyanosis or clubbing.         Assessment & Plan:

## 2010-10-14 NOTE — Patient Instructions (Signed)
Change ipatropium to 4 x daily and just use Only use your xopenex as a rescue medication to be used if you can't catch your breath by resting or doing a relaxed purse lip breathing pattern. The less you use it, the better it will work when you need it.   Prednisone 10 mg take  4 each am x 2 days,   2 each am x 2 days,  1 each am x2days and stop   Please schedule a follow up office visit in 6 weeks, call sooner if needed but bring the medication calendar with you

## 2010-10-15 ENCOUNTER — Encounter: Payer: Self-pay | Admitting: Internal Medicine

## 2010-10-15 NOTE — Assessment & Plan Note (Addendum)
GOLD IV/ 02 dep with Symptoms  markedly disproportionate to objective findings and not clear this is a lung problem but pt does appear to have difficult airway management issues. DDX of  difficult airways managment all start with A and  include Adherence, Ace Inhibitors, Acid Reflux, Active Sinus Disease, Alpha 1 Antitripsin deficiency, Anxiety masquerading as Airways dz,  ABPA,  allergy(esp in young), Aspiration (esp in elderly), Adverse effects of DPI,  Active smokers, plus two Bs  = Bronchiectasis and Beta blocker use..and one C= CHF   Adherence is always the initial "prime suspect" and is a multilayered concern that requires a "trust but verify" approach in every patient - starting with knowing how to use medications, especially inhalers, correctly, keeping up with refills and understanding the fundamental difference between maintenance and prns vs those medications only taken for a very short course and then stopped and not refilled.   - no longer following med calendar  - The proper method of use, as well as anticipated side effects, of this metered-dose inhaler are discussed and demonstrated to the patient. Improved to 90% with coaching.  ? Active sinus dz > ro'd by ent, agree with trial of nasal ipatropium with scopalmine as last resort  He may be underusing atrovent and over using xopenex at this time so rec change the former to qid and only use the latter prn See instructions for specific recommendations which were reviewed directly with the patient who was given a copy with highlighter outlining the key components.

## 2010-10-17 ENCOUNTER — Other Ambulatory Visit: Payer: Self-pay | Admitting: Internal Medicine

## 2010-11-07 ENCOUNTER — Other Ambulatory Visit: Payer: Self-pay | Admitting: Adult Health

## 2010-11-07 ENCOUNTER — Other Ambulatory Visit: Payer: Self-pay | Admitting: Internal Medicine

## 2010-11-08 ENCOUNTER — Telehealth: Payer: Self-pay | Admitting: Internal Medicine

## 2010-11-08 NOTE — Telephone Encounter (Signed)
proventil is fine

## 2010-11-08 NOTE — Telephone Encounter (Signed)
Called the pharm to refill xopenex inhaler and was advised that med needs PA- other alternatives are proventil or ventolin. I called and let the pt know and he states does not recall having tried proventil or ventolin before. Dr Sherene Sires, are you okay with calling in one of these? Please advise, thanks!

## 2010-11-09 MED ORDER — ALBUTEROL SULFATE HFA 108 (90 BASE) MCG/ACT IN AERS: 2.0000 | INHALATION_SPRAY | RESPIRATORY_TRACT | Status: DC | PRN

## 2010-11-09 NOTE — Telephone Encounter (Signed)
Pt is aware of his xopenex inhaler being changed to proventil. Rx has been sent to the pharmacy and nothing further was needed

## 2010-11-18 ENCOUNTER — Ambulatory Visit (INDEPENDENT_AMBULATORY_CARE_PROVIDER_SITE_OTHER): Payer: Medicare Other | Admitting: Internal Medicine

## 2010-11-18 ENCOUNTER — Encounter: Payer: Self-pay | Admitting: Internal Medicine

## 2010-11-18 VITALS — BP 110/70 | HR 79 | Ht 69.0 in | Wt 155.2 lb

## 2010-11-18 DIAGNOSIS — G4710 Hypersomnia, unspecified: Secondary | ICD-10-CM

## 2010-11-18 DIAGNOSIS — G4733 Obstructive sleep apnea (adult) (pediatric): Secondary | ICD-10-CM

## 2010-11-18 DIAGNOSIS — G4711 Idiopathic hypersomnia with long sleep time: Secondary | ICD-10-CM | POA: Insufficient documentation

## 2010-11-18 NOTE — Progress Notes (Signed)
11/18/10- 77 year old male former heavy smoker referred courtesy of Dr. Tenny Craw for sleep evaluation with complaint of daytime fatigue. He complains he wakes each morning tired and falls asleep easily through the day at his computer. He has a feeling that he is not "focused".  Bedtime midnight to 1 AM, sleep latency 5-10 minutes, bathroom wakes him 2 or 3 times during the night with short return to sleep before finally getting up between 9:30 and 10 AM. He wears oxygen at 2 L per minute for sleep and with any activity. Caffeine 2 cups in the morning and 1, with dinner. He says he has no trouble staying awake driving but gets drowsy if he sits and is an active. He is not aware of sleep disturbance and has not been told that he moves his sleep. He used to snore more in the past than he does now. NPSG 12/18/07 WNL AHI 3/hr. NPSG 03/12/05- AHI 0.57/hr. Medical problems include atrial fibrillation, COPD, rhinitis, CAD/CBG 2003, kidney stones.  ROS-see HPI Constitutional:   No-   weight loss, night sweats, fevers, chills,    +fatigue, lassitude. HEENT:   No-  headaches, difficulty swallowing, tooth/dental problems, sore throat,       No-  sneezing, itching, ear ache, nasal congestion, post nasal drip,  CV:  No-   chest pain, orthopnea, PND, swelling in lower extremities, anasarca,                                  dizziness, palpitations Resp: No-   shortness of breath with exertion or at rest.              No-   productive cough,  No non-productive cough,  No- coughing up of blood.              No-   change in color of mucus.  No- wheezing.   Skin: No-   rash or lesions. GI:  No-   heartburn, indigestion, abdominal pain, nausea, vomiting, diarrhea,                 change in bowel habits, loss of appetite GU: No-   dysuria, change in color of urine, no urgency or frequency.  No- flank pain. MS:  No-   joint pain or swelling.  No- decreased range of motion.  No- back pain. Neuro-     nothing unusual Psych:  No-  change in mood or affect. No depression or anxiety.  No memory loss.  OBJ General- Alert, Oriented, Affect-appropriate, Distress- none acute; trim Skin- rash-none, lesions- none, excoriation- none Lymphadenopathy- none Head- atraumatic            Eyes- Gross vision intact, PERRLA, conjunctivae clear secretions            Ears- Hearing, canals-normal            Nose- Clear, no-Septal dev, mucus, polyps, erosion, perforation             Throat- Mallampati II , mucosa clear , drainage- none, tonsils- atrophic; hoarse Neck- flexible , trachea midline, no stridor , thyroid nl, carotid no bruit Chest - symmetrical excursion , unlabored           Heart/CV- RRR , no murmur , no gallop  , no rub, nl s1 s2                           -  JVD- none , edema- none, stasis changes- none, varices- none           Lung- clear to P&A, wheeze- none, cough- none , dullness-none, rub- none           Chest wall-  Abd- tender-no, distended-no, bowel sounds-present, HSM- no Br/ Gen/ Rectal- Not done, not indicated Extrem- cyanosis- none, clubbing, none, atrophy- none, strength- nl Neuro- grossly intact to observation

## 2010-11-18 NOTE — Patient Instructions (Signed)
Sample Nuvigil 150 mg-    Try 1/2 or 1 tab each morning as needed for alertness  Please call as needed

## 2010-11-22 NOTE — Assessment & Plan Note (Signed)
He has not demonstrated obstructive sleep apnea or other organic sleep disturbance as the basis for his sense of daytime sleepiness and lack of focus. We have discussed sleep hygiene and intervention choices. We're going to let him try samples of Nuvgil. This may address his sense of fatigue without being too aggressive for his coronary artery history. If this is not helpful, we might try theophylline as a stimulating bronchodilator. We may decide the best diagnosis is idiopathic hypersomnia. We considered getting a multiple sleep latency test, but will wait to assess medication effect.

## 2010-11-25 ENCOUNTER — Ambulatory Visit (INDEPENDENT_AMBULATORY_CARE_PROVIDER_SITE_OTHER): Payer: Medicare Other | Admitting: Internal Medicine

## 2010-11-25 ENCOUNTER — Encounter: Payer: Self-pay | Admitting: Internal Medicine

## 2010-11-25 DIAGNOSIS — J9610 Chronic respiratory failure, unspecified whether with hypoxia or hypercapnia: Secondary | ICD-10-CM

## 2010-11-25 DIAGNOSIS — J449 Chronic obstructive pulmonary disease, unspecified: Secondary | ICD-10-CM

## 2010-11-25 NOTE — Patient Instructions (Signed)

## 2010-11-25 NOTE — Progress Notes (Signed)
Subjective:    Patient ID: [name], male    DOB: Nov 29, 1929, 77 y.o.   MRN: [id number]  HPI Summary: Pulmonary/ f/u ov try astepro for pnds   Primary Provider/Referring Provider: [name]  yowm last smoked 1983 severe COPD FEV1 of only 36% April 2007 with a 29% improvement after bronchodilators consistent with an asthmatic component. Baseline activity level--walk 1 mile daily with oxygen on. Maintained on Symbicort 160/4.56mcg 1 puff two times a day.   February 11, 2008 ov never took spiriva consistently. No variability to doe or sign cough. rec spiriva but w/in one week cp > MCH with neg cardiac wu, still having occ cp , positional, better with rubbing chest and relaxing.  Retry spiriva   October 15, 2008 rov follow up, about the same did not get along well with spiriva , discont. taking caused dry mouth. still definitely benefits from as needed xopenex.    February 18, 2010 -- ov cc cough with green/white mucus, wheezing, SOB x5days. OTC not helping. Cough is worse at night, keeping him up at times. Wheezing started yesterday. rex doxy, prednisone then admit 2/14 to [facility] and d/c 2/17 with ct c/w emphysema > d/c on zmax and prednisone tapered off as of March 03, 2010   March 03, 2010 ov cc  minimally improved sob and cough with green sputum and nasal congestion esp in am .. doex > slow adls  minimally better p nebs rec  See Patient Care Coordinator before leaving for sinus CT  Thickening only  Prilosec otc 20mg  30 min before bfast and zantac 150 at bedtime as long as coughing ( reflux is to cough what oxygen is to fire)  Augmentin 875 mg twice daily with water and eat yogurt for lunch  Prednisone 10mg  2 daily back to normal then 1 daily until return.   March 17, 2010 ov Cough and Dyspnea- some better, mucus is now white and less volume worse lying down, less need for rescue.  rec taper off prednisone >  5 days off  04/28/2010 ov/Claudia Greenley no worse off prednisone,  Rare astepro, no  need for rescue or even using the atrovent/xop more than twice dailly - overall much better.   rec Ipatropium / xopenex are twice regulary with ability to increase to 4 x daily if breathing worsens   10/14/2010 f/u ov/Nadege Carriger cc Pt states breathing worse with exertion x 1 month. Still has no cough but lots of mucus esp in am with  No purulent sputum. ent wants to try ipatropium and scopalamine to dry up his drippy nose - no longer following med calendar for action plan at bottom rec Change ipatropium to 4 x daily and just use Only use your xopenex as a rescue medication to be used if you can't catch your breath by resting or doing a relaxed purse lip breathing pattern. The less you use it, the better it will work when you need it.   Prednisone 10 mg take  4 each am x 2 days,   2 each am x 2 days,  1 each am x2days and stop   Please schedule a follow up office visit in 6 weeks, call sooner if needed but bring the medication calendar with you   11/25/2010 f/u ov/Chet Greenley cc breathing better, nasal drainage better.minimal cough. Doe at bas[place].minimal use of rescue    Sleeping better on 2lpm without nocturnal  or early am exacerbation  of respiratory  c/o's or need for noct saba.  Also denies any obvious fluctuation of symptoms with weather or environmental changes or other aggravating or alleviating factors except as outlined above   Pt denies any significant sore throat, dysphagia, itching, sneezing,  nasal congestion or excess/ purulent secretions,  fever, chills, sweats, unintended wt loss, pleuritic or exertional cp, hempoptysis, orthopnea pnd or leg swelling.        Past Medical History:  CHRONIC RHINITIS (ICD-472.0)  - Sinus ct March 03, 2010 > thickening only, no acute changes  CHRONIC RESPIRATORY FAILURE (ICD-518.83)  - 02 rx since 2008  COPD UNSPECIFIED (ICD-496)....................................................................[name]  - PFTs 05/04/05 FEV1 36% ratio 28% with 29%  improvement after bronchodilators DLCO 48%  - PFT's 03/26/08 30% ratio 34 with 14% improvement after bronchodilators DLC0 38 %  - Start noct 02 at 2lpm 2008 and on 03/04/09 desat @ > 185 ft so rec wear with activtiy > rm to rm  - [facility] June 30, 2008 100% > confirmed March 04, 2009  ISCHEMIC HEART DISEASE (ICD-414.9)  HYPERTENSION (ICD-401.9)  HYPERLIPIDEMIA (ICD-272.4)  SPN RUL by cxr August 19, 2009  COMPLEX MED REGIMEN-Meds reviewed with pt education and computerized med calendar 11/22/09                    Objective:   Physical Exam  amb wm nad wt 159 04/28/2010  > 10/14/2010  154 > 11/25/2010  155 HEENT mild turbinate edema.  Oropharynx no thrush or excess pnd or cobblestoning.  No JVD or cervical adenopathy. Mild accessory muscle hypertrophy. Trachea midline, nl thryroid. Chest was hyperinflated by percussion with diminished breath sounds and moderate increased exp time without wheeze. Hoover sign positive at mid inspiration. Regular rate and rhythm without murmur gallop or rub or increase P2 or edema.  Abd: no hsm, nl excursion. Ext warm without cyanosis or clubbing.         Assessment & Plan:

## 2010-11-26 NOTE — Assessment & Plan Note (Signed)
-   02 2lpm at hs and with activity > room to room  Adequate control on present rx, reviewed

## 2010-11-26 NOTE — Assessment & Plan Note (Signed)
Better with control now of rhinitis.    Each maintenance medication was reviewed in detail including most importantly the difference between maintenance and as needed and under what circumstances the prns are to be used.  Please see instructions for details which were reviewed in writing and the patient given a copy.

## 2010-11-30 ENCOUNTER — Encounter: Payer: Self-pay | Admitting: Internal Medicine

## 2010-12-22 ENCOUNTER — Ambulatory Visit: Payer: Medicare Other | Admitting: Internal Medicine

## 2010-12-26 ENCOUNTER — Other Ambulatory Visit: Payer: Self-pay | Admitting: Internal Medicine

## 2010-12-27 ENCOUNTER — Telehealth: Payer: Self-pay | Admitting: Internal Medicine

## 2010-12-27 NOTE — Telephone Encounter (Signed)
LMOMTCB x 1 for Kim @ [facility].

## 2010-12-28 NOTE — Telephone Encounter (Signed)
Spoke with [facility]-handwritten Rx by Almyra Free was faxed without DX code; I had them fax to [place] and placed DX's on there. I faxed back-phone note complete.

## 2010-12-28 NOTE — Telephone Encounter (Signed)
Pt called back. Needs order and diagnosis faxed 'ASAP" to [place] because pt is waiting now. Pt # [id number]. [name]

## 2010-12-28 NOTE — Telephone Encounter (Signed)
Lm for kim to call back

## 2011-01-20 ENCOUNTER — Telehealth: Payer: Self-pay | Admitting: Internal Medicine

## 2011-01-20 MED ORDER — FUROSEMIDE 20 MG PO TABS: 20.0000 mg | ORAL_TABLET | Freq: Every day | ORAL | Status: DC | PRN

## 2011-01-20 NOTE — Telephone Encounter (Signed)
RX for lasix has been sent and pt is aware

## 2011-01-23 DIAGNOSIS — J449 Chronic obstructive pulmonary disease, unspecified: Secondary | ICD-10-CM | POA: Diagnosis not present

## 2011-01-23 DIAGNOSIS — R911 Solitary pulmonary nodule: Secondary | ICD-10-CM | POA: Diagnosis not present

## 2011-01-23 DIAGNOSIS — J441 Chronic obstructive pulmonary disease with (acute) exacerbation: Secondary | ICD-10-CM | POA: Diagnosis not present

## 2011-01-23 DIAGNOSIS — J438 Other emphysema: Secondary | ICD-10-CM | POA: Diagnosis not present

## 2011-01-26 ENCOUNTER — Other Ambulatory Visit: Payer: Self-pay | Admitting: Internal Medicine

## 2011-01-27 DIAGNOSIS — R05 Cough: Secondary | ICD-10-CM | POA: Diagnosis not present

## 2011-01-27 DIAGNOSIS — R059 Cough, unspecified: Secondary | ICD-10-CM | POA: Diagnosis not present

## 2011-01-27 DIAGNOSIS — R0982 Postnasal drip: Secondary | ICD-10-CM | POA: Diagnosis not present

## 2011-01-30 DIAGNOSIS — J441 Chronic obstructive pulmonary disease with (acute) exacerbation: Secondary | ICD-10-CM | POA: Diagnosis not present

## 2011-02-01 DIAGNOSIS — R05 Cough: Secondary | ICD-10-CM | POA: Diagnosis not present

## 2011-02-01 DIAGNOSIS — J329 Chronic sinusitis, unspecified: Secondary | ICD-10-CM | POA: Diagnosis not present

## 2011-02-01 DIAGNOSIS — R059 Cough, unspecified: Secondary | ICD-10-CM | POA: Diagnosis not present

## 2011-02-06 DIAGNOSIS — R059 Cough, unspecified: Secondary | ICD-10-CM | POA: Diagnosis not present

## 2011-02-06 DIAGNOSIS — K219 Gastro-esophageal reflux disease without esophagitis: Secondary | ICD-10-CM | POA: Diagnosis not present

## 2011-02-06 DIAGNOSIS — R05 Cough: Secondary | ICD-10-CM | POA: Diagnosis not present

## 2011-02-06 DIAGNOSIS — K449 Diaphragmatic hernia without obstruction or gangrene: Secondary | ICD-10-CM | POA: Diagnosis not present

## 2011-02-12 ENCOUNTER — Other Ambulatory Visit: Payer: Self-pay | Admitting: Internal Medicine

## 2011-02-15 ENCOUNTER — Encounter: Payer: Self-pay | Admitting: Internal Medicine

## 2011-02-15 ENCOUNTER — Ambulatory Visit (INDEPENDENT_AMBULATORY_CARE_PROVIDER_SITE_OTHER): Payer: Medicare Other | Admitting: Internal Medicine

## 2011-02-15 DIAGNOSIS — J9610 Chronic respiratory failure, unspecified whether with hypoxia or hypercapnia: Secondary | ICD-10-CM

## 2011-02-15 DIAGNOSIS — J449 Chronic obstructive pulmonary disease, unspecified: Secondary | ICD-10-CM

## 2011-02-15 NOTE — Progress Notes (Signed)
Subjective:    Patient ID: [name], male    DOB: [birth year], 78 y.o.   MRN: [id number]  HPI Summary: Pulmonary/ f/u ov try astepro for pnds   Primary Provider/Referring Provider: [name]  yowm last smoked 1983 severe COPD FEV1 of only 36% April 2007 with a 29% improvement after bronchodilators consistent with an asthmatic component. Baseline activity level--walk 1 mile daily with oxygen on. Maintained on Symbicort 160/4.28mcg 1 puff two times a day.   February 11, 2008 ov never took spiriva consistently. No variability to doe or sign cough. rec spiriva but w/in one week cp > MCH with neg cardiac wu, still having occ cp , positional, better with rubbing chest and relaxing.  Retry spiriva   October 15, 2008 rov follow up, about the same did not get along well with spiriva , discont. taking caused dry mouth. still definitely benefits from as needed xopenex.    February 18, 2010 -- ov cc cough with green/white mucus, wheezing, SOB x5days. OTC not helping. Cough is worse at night, keeping him up at times. Wheezing started yesterday. rex doxy, prednisone then admit 2/14 to [facility] and d/c 2/17 with ct c/w emphysema > d/c on zmax and prednisone tapered off as of March 03, 2010   March 03, 2010 ov cc  minimally improved sob and cough with green sputum and nasal congestion esp in am .. doex > slow adls  minimally better p nebs rec  See Patient Care Coordinator before leaving for sinus CT  Thickening only  Prilosec otc 20mg  30 min before bfast and zantac 150 at bedtime as long as coughing ( reflux is to cough what oxygen is to fire)  Augmentin 875 mg twice daily with water and eat yogurt for lunch  Prednisone 10mg  2 daily back to normal then 1 daily until return.   March 17, 2010 ov Cough and Dyspnea- some better, mucus is now white and less volume worse lying down, less need for rescue.  rec taper off prednisone >  5 days off  04/28/2010 ov/Aryiah Monterosso no worse off prednisone,  Rare astepro, no  need for rescue or even using the atrovent/xop more than twice dailly - overall much better.   rec Ipatropium / xopenex are twice regulary with ability to increase to 4 x daily if breathing worsens   10/14/2010 f/u ov/Crews Mccollam cc Pt states breathing worse with exertion x 1 month. Still has no cough but lots of mucus esp in am with  No purulent sputum. ent wants to try ipatropium and scopalamine to dry up his drippy nose - no longer following med calendar for action plan at bottom rec Change ipatropium to 4 x daily and just use Only use your xopenex as a rescue medication to be used if you can't catch your breath by resting or doing a relaxed purse lip breathing pattern. The less you use it, the better it will work when you need it.   Prednisone 10 mg take  4 each am x 2 days,   2 each am x 2 days,  1 each am x2days and stop   Please schedule a follow up office visit in 6 weeks, call sooner if needed but bring the medication calendar with you   11/25/2010 f/u ov/Shanterica Biehler cc breathing better, nasal drainage better.minimal cough. Doe at [place].minimal use of rescue   rec Follow med calendar  02/15/2011 f/u ov/Cherrill Scrima took last prednisone one week prior to OV (two courses from [place] pulmonary doc  since last ov here)  Chest now Feels clear no cough but still sob x 50 ft. Using xopenex neb qid instead of prn  Sleeping better on 2lpm without nocturnal  or early am exacerbation  of respiratory  c/o's or need for noct saba. Also denies any obvious fluctuation of symptoms with weather or environmental changes or other aggravating or alleviating factors except as outlined above.  Pt denies any significant sore throat, dysphagia, itching, sneezing,  nasal congestion or excess/ purulent secretions,  fever, chills, sweats, unintended wt loss, pleuritic or exertional cp, hempoptysis, orthopnea pnd or leg swelling.    ROS  At present neg for  any significant sore throat, dysphagia, itching, sneezing,  nasal  congestion or excess/ purulent secretions,  fever, chills, sweats, unintended wt loss, pleuritic or exertional cp, hempoptysis, orthopnea pnd or leg swelling.  Also denies presyncope, palpitations, heartburn, abdominal pain, nausea, vomiting, diarrhea  or change in bowel or urinary habits, dysuria,hematuria,  rash, arthralgias, visual complaints, headache, numbness weakness or ataxia.         Past Medical History:  CHRONIC RHINITIS (ICD-472.0)  - Sinus ct March 03, 2010 > thickening only, no acute changes  CHRONIC RESPIRATORY FAILURE (ICD-518.83)  - 02 rx since 2008  COPD UNSPECIFIED (ICD-496)....................................................................[name]  - PFTs 05/04/05 FEV1 36% ratio 28% with 29% improvement after bronchodilators DLCO 48%  - PFT's 03/26/08 30% ratio 34 with 14% improvement after bronchodilators DLC0 38 %  - Start noct 02 at 2lpm 2008 and on 03/04/09 desat @ > 185 ft so rec wear with activtiy > rm to rm  - [facility] June 30, 2008 100% > confirmed March 04, 2009  ISCHEMIC HEART DISEASE (ICD-414.9)  HYPERTENSION (ICD-401.9)  HYPERLIPIDEMIA (ICD-272.4)  SPN RUL by cxr August 19, 2009  COMPLEX MED REGIMEN-Meds reviewed with pt education and computerized med calendar 11/22/09                    Objective:   Physical Exam  amb wm nad wt 159 04/28/2010  > 10/14/2010  154 > 11/25/2010  155 > 02/15/2011  153 HEENT mild turbinate edema.  Oropharynx no thrush or excess pnd or cobblestoning.  No JVD or cervical adenopathy. Mild accessory muscle hypertrophy. Trachea midline, nl thryroid. Chest was hyperinflated by percussion with diminished breath sounds and moderate increased exp time without wheeze. Hoover sign positive at mid inspiration. Regular rate and rhythm without murmur gallop or rub or increase P2 or edema.  Abd: no hsm, nl excursion. Ext warm without cyanosis or clubbing.         Assessment & Plan:

## 2011-02-15 NOTE — Assessment & Plan Note (Addendum)
-   02 2lpm at hs and with activity > room to room    - 02/15/2011   [facility] RA x one lap @ 185 stopped due to  desat > corrected on 2lpm Adequate control on present rx, reviewed need to wear 02 for anything more than room to room

## 2011-02-15 NOTE — Patient Instructions (Signed)
Only use your levoalbuterol(xopenex)  as a rescue medication(plan b xepenex inhaler, plan c is the nebulizer) to be used if you can't catch your breath by resting or doing a relaxed purse lip breathing pattern lup to every 4 hours.  The less you use it, the better it will work when you need it.   Zantac 150 mg twice daily  See Tammy NP w/in 2 weeks with all your medications, even over the counter meds, separated in two separate bags, the ones you take no matter what vs the ones you stop once you feel better and take only as needed when you feel you need them.   Tammy  will generate for you a new user friendly medication calendar that will put [place] all on the same page re: your medication use.     Without this process, it simply isn't possible to assure that we are providing  your outpatient care  with  the attention to detail we feel you deserve.   If we cannot assure that you're getting that kind of care,  then we cannot manage your problem effectively from this clinic.  Once you have seen Tammy and we are sure that we're all on the same page with your medication use she will arrange follow up with me.

## 2011-02-15 NOTE — Assessment & Plan Note (Signed)
-   PFTs 05/04/05 FEV1 36% ratio 28% with 29% improvement after bronchodilators DLCO 48%  - PFT's 03/26/08 30% ratio 34 with 14% improvement after bronchodilators DLC0 38 %  - Start noct 02 at 2lpm 2008 and on 03/04/09 desat @ > 185 ft so rec wear with activtiy > rm to rm  - HFA 90% 10/14/2010   GOLD III/IV 02 dep at hs and with activity > room to room s/p two recent exac requiring steroids but off x one week  DDX of  difficult airways managment all start with A and  include Adherence, Ace Inhibitors, Acid Reflux, Active Sinus Disease, Alpha 1 Antitripsin deficiency, Anxiety masquerading as Airways dz,  ABPA,  allergy(esp in young), Aspiration (esp in elderly), Adverse effects of DPI,  Active smokers, plus two Bs  = Bronchiectasis and Beta blocker use..and one C= CHF  Adherence is always the initial "prime suspect" and is a multilayered concern that requires a "trust but verify" approach in every patient - starting with knowing how to use medications, especially inhalers, correctly, keeping up with refills and understanding the fundamental difference between maintenance and prns vs those medications only taken for a very short course and then stopped and not refilled.   Each maintenance medication was reviewed in detail including most importantly the difference between maintenance and as needed and under what circumstances the prns are to be used. This was done in the context of a medication calendar review which provided the patient with a user-friendly unambiguous mechanism for medication administration and reconciliation and provides an action plan for all active problems. It is critical that this be shown to every doctor  for modification during the office visit if necessary so the patient can use it as a working document.      ? Acid reflux > add zantac 150 bid for now (UGI done in [place] results requested may add ppi next)

## 2011-02-23 ENCOUNTER — Ambulatory Visit: Payer: Medicare Other | Admitting: Internal Medicine

## 2011-03-02 ENCOUNTER — Encounter: Payer: Medicare Other | Admitting: Adult Health

## 2011-03-10 ENCOUNTER — Ambulatory Visit (INDEPENDENT_AMBULATORY_CARE_PROVIDER_SITE_OTHER): Payer: Medicare Other | Admitting: Adult Health

## 2011-03-10 ENCOUNTER — Encounter: Payer: Self-pay | Admitting: Adult Health

## 2011-03-10 DIAGNOSIS — J449 Chronic obstructive pulmonary disease, unspecified: Secondary | ICD-10-CM | POA: Diagnosis not present

## 2011-03-10 NOTE — Patient Instructions (Signed)
Follow med calendar closely and bring to each visit.  Continue on current regimen.  Take Ipratropium NEB Four times a day   Continue on Symbicort 2 puffs Twice daily  -brush/rinse and gargle after use.  Please contact office for sooner follow up if symptoms do not improve or worsen or seek emergency care   follow up Dr. Sherene Sires  In 6-8 weeks and As needed

## 2011-03-10 NOTE — Progress Notes (Signed)
Subjective:    Patient ID: [name], male    DOB: September 23, 1929, 78 y.o.   MRN: [id number]  HPI Summary: Pulmonary/ f/u ov try astepro for pnds   Primary Provider/Referring Provider: [name]  yowm last smoked 1983 severe COPD FEV1 of only 36% April 2007 with a 29% improvement after bronchodilators consistent with an asthmatic component. Baseline activity level--walk 1 mile daily with oxygen on. Maintained on Symbicort 160/4.63mcg 1 puff two times a day.   February 11, 2008 ov never took spiriva consistently. No variability to doe or sign cough. rec spiriva but w/in one week cp > MCH with neg cardiac wu, still having occ cp , positional, better with rubbing chest and relaxing.  Retry spiriva   October 15, 2008 rov follow up, about the same did not get along well with spiriva , discont. taking caused dry mouth. still definitely benefits from as needed xopenex.    February 18, 2010 -- ov cc cough with green/white mucus, wheezing, SOB x5days. OTC not helping. Cough is worse at night, keeping him up at times. Wheezing started yesterday. rex doxy, prednisone then admit 2/14 to [facility] and d/c 2/17 with ct c/w emphysema > d/c on zmax and prednisone tapered off as of March 03, 2010   March 03, 2010 ov cc  minimally improved sob and cough with green sputum and nasal congestion esp in am .. doex > slow adls  minimally better p nebs rec  See Patient Care Coordinator before leaving for sinus CT  Thickening only  Prilosec otc 20mg  30 min before bfast and zantac 150 at bedtime as long as coughing ( reflux is to cough what oxygen is to fire)  Augmentin 875 mg twice daily with water and eat yogurt for lunch  Prednisone 10mg  2 daily back to normal then 1 daily until return.   March 17, 2010 ov Cough and Dyspnea- some better, mucus is now white and less volume worse lying down, less need for rescue.  rec taper off prednisone >  5 days off  04/28/2010 ov/Wert no worse off prednisone,  Rare astepro, no  need for rescue or even using the atrovent/xop more than twice dailly - overall much better.   rec Ipatropium / xopenex are twice regulary with ability to increase to 4 x daily if breathing worsens   10/14/2010 f/u ov/Wert cc Pt states breathing worse with exertion x 1 month. Still has no cough but lots of mucus esp in am with  No purulent sputum. ent wants to try ipatropium and scopalamine to dry up his drippy nose - no longer following med calendar for action plan at bottom rec Change ipatropium to 4 x daily and just use Only use your xopenex as a rescue medication to be used if you can't catch your breath by resting or doing a relaxed purse lip breathing pattern. The less you use it, the better it will work when you need it.   Prednisone 10 mg take  4 each am x 2 days,   2 each am x 2 days,  1 each am x2days and stop   Please schedule a follow up office visit in 6 weeks, call sooner if needed but bring the medication calendar with you   11/25/2010 f/u ov/Wert cc breathing better, nasal drainage better.minimal cough. Doe at [place].minimal use of rescue   rec Follow med calendar  02/15/2011 f/u ov/Wert took last prednisone one week prior to OV (two courses from [place] pulmonary doc  since last ov here)  Chest now Feels clear no cough but still sob x 50 ft. Using xopenex neb qid instead of prn  Sleeping better on 2lpm without nocturnal  or early am exacerbation  of respiratory  c/o's or need for noct saba. Also denies any obvious fluctuation of symptoms with weather or environmental changes or other aggravating or alleviating factors except as outlined above. >>Zantac Twice daily    03/10/2011 Follow up and Med review  Returns for follow up and med review. We reviewed his meds and organized them into a med calendar with pt educaiton.  Since last ov feels he is at his baseline with no flare in cough or dyspnea.          Past Medical History:  CHRONIC RHINITIS (ICD-472.0)  - Sinus  ct March 03, 2010 > thickening only, no acute changes  CHRONIC RESPIRATORY FAILURE (ICD-518.83)  - 02 rx since 2008  COPD UNSPECIFIED (ICD-496)....................................................................[name]  - PFTs 05/04/05 FEV1 36% ratio 28% with 29% improvement after bronchodilators DLCO 48%  - PFT's 03/26/08 30% ratio 34 with 14% improvement after bronchodilators DLC0 38 %  - Start noct 02 at 2lpm 2008 and on 03/04/09 desat @ > 185 ft so rec wear with activtiy > rm to rm  - [facility] June 30, 2008 100% > confirmed March 04, 2009  ISCHEMIC HEART DISEASE (ICD-414.9)  HYPERTENSION (ICD-401.9)  HYPERLIPIDEMIA (ICD-272.4)  SPN RUL by cxr August 19, 2009  COMPLEX MED REGIMEN-Meds reviewed with pt education and computerized med calendar 11/22/09 03/10/2011                     Objective:   Physical Exam  amb wm nad wt 159 04/28/2010  > 10/14/2010  154 > 11/25/2010  155 > 02/15/2011  153 HEENT mild turbinate edema.  Oropharynx no thrush or excess pnd or cobblestoning.  No JVD or cervical adenopathy. Mild accessory muscle hypertrophy. Trachea midline, nl thryroid. Chest was hyperinflated by percussion with diminished breath sounds and moderate increased exp time without wheeze. Hoover sign positive at mid inspiration. Regular rate and rhythm without murmur gallop or rub or increase P2 or edema.  Abd: no hsm, nl excursion. Ext warm without cyanosis or clubbing.         Assessment & Plan:

## 2011-03-14 MED ORDER — VITAMIN D3 25 MCG (1000 UT) PO CAPS: 1.0000 | ORAL_CAPSULE | Freq: Every day | ORAL | Status: DC

## 2011-03-14 MED ORDER — LEVALBUTEROL HCL 1.25 MG/3ML IN NEBU: 1.2500 mg | INHALATION_SOLUTION | RESPIRATORY_TRACT | Status: DC | PRN

## 2011-03-14 MED ORDER — LEVALBUTEROL TARTRATE 45 MCG/ACT IN AERO: 2.0000 | INHALATION_SPRAY | RESPIRATORY_TRACT | Status: DC | PRN

## 2011-03-14 MED ORDER — IPRATROPIUM BROMIDE 0.02 % IN SOLN: 500.0000 ug | Freq: Four times a day (QID) | RESPIRATORY_TRACT | Status: DC

## 2011-03-14 MED ORDER — RANITIDINE HCL 150 MG PO TABS: 150.0000 mg | ORAL_TABLET | Freq: Two times a day (BID) | ORAL | Status: DC

## 2011-03-17 DIAGNOSIS — J449 Chronic obstructive pulmonary disease, unspecified: Secondary | ICD-10-CM | POA: Diagnosis not present

## 2011-03-17 NOTE — Assessment & Plan Note (Signed)
Compensated on present regimen.  Patient's medications were reviewed today and patient education was given. Computerized medication calendar was adjusted/completed Plan:  Follow med calendar closely and bring to each visit.  Continue on current regimen.  Take Ipratropium NEB Four times a day   Continue on Symbicort 2 puffs Twice daily  -brush/rinse and gargle after use.  Please contact office for sooner follow up if symptoms do not improve or worsen or seek emergency care   follow up Dr. Sherene Sires  In 6-8 weeks and As needed

## 2011-03-31 ENCOUNTER — Other Ambulatory Visit: Payer: Self-pay | Admitting: Internal Medicine

## 2011-04-11 DIAGNOSIS — E782 Mixed hyperlipidemia: Secondary | ICD-10-CM | POA: Diagnosis not present

## 2011-04-11 DIAGNOSIS — Z79899 Other long term (current) drug therapy: Secondary | ICD-10-CM | POA: Diagnosis not present

## 2011-04-12 DIAGNOSIS — I10 Essential (primary) hypertension: Secondary | ICD-10-CM | POA: Diagnosis not present

## 2011-04-12 DIAGNOSIS — R0602 Shortness of breath: Secondary | ICD-10-CM | POA: Diagnosis not present

## 2011-04-12 DIAGNOSIS — I25810 Atherosclerosis of coronary artery bypass graft(s) without angina pectoris: Secondary | ICD-10-CM | POA: Diagnosis not present

## 2011-04-18 DIAGNOSIS — M6530 Trigger finger, unspecified finger: Secondary | ICD-10-CM | POA: Diagnosis not present

## 2011-04-18 DIAGNOSIS — R5381 Other malaise: Secondary | ICD-10-CM | POA: Diagnosis not present

## 2011-04-18 DIAGNOSIS — R5383 Other fatigue: Secondary | ICD-10-CM | POA: Diagnosis not present

## 2011-04-18 DIAGNOSIS — Z79899 Other long term (current) drug therapy: Secondary | ICD-10-CM | POA: Diagnosis not present

## 2011-04-18 DIAGNOSIS — R51 Headache: Secondary | ICD-10-CM | POA: Diagnosis not present

## 2011-04-18 DIAGNOSIS — E559 Vitamin D deficiency, unspecified: Secondary | ICD-10-CM | POA: Diagnosis not present

## 2011-04-18 DIAGNOSIS — R972 Elevated prostate specific antigen [PSA]: Secondary | ICD-10-CM | POA: Diagnosis not present

## 2011-04-25 DIAGNOSIS — N401 Enlarged prostate with lower urinary tract symptoms: Secondary | ICD-10-CM | POA: Diagnosis not present

## 2011-04-25 DIAGNOSIS — R972 Elevated prostate specific antigen [PSA]: Secondary | ICD-10-CM | POA: Diagnosis not present

## 2011-05-01 ENCOUNTER — Ambulatory Visit (INDEPENDENT_AMBULATORY_CARE_PROVIDER_SITE_OTHER): Payer: Medicare Other | Admitting: Internal Medicine

## 2011-05-01 ENCOUNTER — Encounter: Payer: Self-pay | Admitting: Internal Medicine

## 2011-05-01 VITALS — BP 140/68 | HR 88 | Temp 97.7°F | Ht 69.0 in | Wt 154.8 lb

## 2011-05-01 DIAGNOSIS — J449 Chronic obstructive pulmonary disease, unspecified: Secondary | ICD-10-CM | POA: Diagnosis not present

## 2011-05-01 DIAGNOSIS — J4489 Other specified chronic obstructive pulmonary disease: Secondary | ICD-10-CM

## 2011-05-01 DIAGNOSIS — J9610 Chronic respiratory failure, unspecified whether with hypoxia or hypercapnia: Secondary | ICD-10-CM

## 2011-05-01 MED ORDER — ACLIDINIUM BROMIDE 400 MCG/ACT IN AEPB: 1.0000 | INHALATION_SPRAY | Freq: Two times a day (BID) | RESPIRATORY_TRACT | Status: DC

## 2011-05-01 NOTE — Progress Notes (Signed)
Subjective:    Patient ID: [name], male    DOB: [birth year], 78 y.o.   MRN: [id number]  HPI Summary: Pulmonary/ f/u ov try astepro for pnds   Primary Provider/Referring Provider: [name]  yowm last smoked 1983 severe COPD FEV1 of only 36% April 2007 with a 29% improvement after bronchodilators consistent with an asthmatic component. Baseline activity level--walk 1 mile daily with oxygen on. Maintained on Symbicort 160/4.41mcg 1 puff two times a day.   February 11, 2008 ov never took spiriva consistently. No variability to doe or sign cough. rec spiriva but w/in one week cp > MCH with neg cardiac wu, still having occ cp , positional, better with rubbing chest and relaxing.  Retry spiriva   October 15, 2008 rov follow up, about the same did not get along well with spiriva , discont. taking caused dry mouth. still definitely benefits from as needed xopenex.    February 18, 2010 -- ov cc cough with green/white mucus, wheezing, SOB x5days. OTC not helping. Cough is worse at night, keeping him up at times. Wheezing started yesterday. rex doxy, prednisone then admit 2/14 to [facility] and d/c 2/17 with ct c/w emphysema > d/c on zmax and prednisone tapered off as of March 03, 2010   March 03, 2010 ov cc  minimally improved sob and cough with green sputum and nasal congestion esp in am .. doex > slow adls  minimally better p nebs rec  See Patient Care Coordinator before leaving for sinus CT  Thickening only  Prilosec otc 20mg  30 min before bfast and zantac 150 at bedtime as long as coughing ( reflux is to cough what oxygen is to fire)  Augmentin 875 mg twice daily with water and eat yogurt for lunch  Prednisone 10mg  2 daily back to normal then 1 daily until return.   March 17, 2010 ov Cough and Dyspnea- some better, mucus is now white and less volume worse lying down, less need for rescue.  rec taper off prednisone >  5 days off  04/28/2010 ov/[name] no worse off prednisone,  Rare astepro, no  need for rescue or even using the atrovent/xop more than twice dailly - overall much better.   rec Ipatropium / xopenex are twice regulary with ability to increase to 4 x daily if breathing worsens   10/14/2010 f/u ov/[name] cc Pt states breathing worse with exertion x 1 month. Still has no cough but lots of mucus esp in am with  No purulent sputum. ent wants to try ipatropium and scopalamine to dry up his drippy nose - no longer following med calendar for action plan at bottom rec Change ipatropium to 4 x daily and just use Only use your xopenex as a rescue medication to be used if you can't catch your breath by resting or doing a relaxed purse lip breathing pattern. The less you use it, the better it will work when you need it.   Prednisone 10 mg take  4 each am x 2 days,   2 each am x 2 days,  1 each am x2days and stop   Please schedule a follow up office visit in 6 weeks, call sooner if needed but bring the medication calendar with you   11/25/2010 f/u ov/[name] cc breathing better, nasal drainage better.minimal cough. Doe at [place].minimal use of rescue   rec Follow med calendar  02/15/2011 f/u ov/[name] took last prednisone one week prior to OV (two courses from [place] pulmonary doc  since last ov here)  Chest now Feels clear no cough but still sob x 50 ft. Using xopenex neb qid instead of prn  Sleeping better on 2lpm without nocturnal  or early am exacerbation  of respiratory  c/o's or need for noct saba. Also denies any obvious fluctuation of symptoms with weather or environmental changes or other aggravating or alleviating factors except as outlined above. >>Zantac Twice daily    03/10/2011 Follow up and Med review  Returns for follow up and med review. We reviewed his meds and organized them into a med calendar with pt educaiton.   rec  05/01/2011 f/u ov/Brandi Tomlinson cc 50 ft outside, one aisle at [employer], whole store with 02. No cough and minimal need for saba  Seen at [facility] rec change ipatropium  neb  to atrovent > no change doe.  Sleeping ok without nocturnal  or early am exacerbation  of respiratory  c/o's or need for noct saba. Also denies any obvious fluctuation of symptoms with weather or environmental changes or other aggravating or alleviating factors except as outlined above   ROS  At present neg for  any significant sore throat, dysphagia, dental problems, itching, sneezing,  nasal congestion or excess/ purulent secretions, ear ache,   fever, chills, sweats, unintended wt loss, pleuritic or exertional cp, hemoptysis, palpitations, orthopnea pnd or leg swelling.  Also denies presyncope, palpitations, heartburn, abdominal pain, anorexia, nausea, vomiting, diarrhea  or change in bowel or urinary habits, change in stools or urine, dysuria,hematuria,  rash, arthralgias, visual complaints, headache, numbness weakness or ataxia or problems with walking or coordination. No noted change in mood/affect or memory.                           Past Medical History:  CHRONIC RHINITIS (ICD-472.0)  - Sinus ct March 03, 2010 > thickening only, no acute changes  CHRONIC RESPIRATORY FAILURE (ICD-518.83)  - 02 rx since 2008  COPD UNSPECIFIED (ICD-496)....................................................................[name]  - PFTs 05/04/05 FEV1 36% ratio 28% with 29% improvement after bronchodilators DLCO 48%  - PFT's 03/26/08 30% ratio 34 with 14% improvement after bronchodilators DLC0 38 %  - Start noct 02 at 2lpm 2008 and on 03/04/09 desat @ > 185 ft so rec wear with activtiy > rm to rm  - [facility] June 30, 2008 100% > confirmed March 04, 2009  ISCHEMIC HEART DISEASE (ICD-414.9)  HYPERTENSION (ICD-401.9)  HYPERLIPIDEMIA (ICD-272.4)  SPN RUL by cxr August 19, 2009  COMPLEX MED REGIMEN-Meds reviewed with pt education and computerized med calendar 11/22/09 03/10/2011                     Objective:   Physical Exam  amb wm nad wt 159 04/28/2010  > 10/14/2010  154 > 11/25/2010   155 > 02/15/2011  153> 05/01/2011 154 HEENT mild turbinate edema.  Oropharynx no thrush or excess pnd or cobblestoning.  No JVD or cervical adenopathy. Mild accessory muscle hypertrophy. Trachea midline, nl thryroid. Chest was hyperinflated by percussion with diminished breath sounds and moderate increased exp time without wheeze. Hoover sign positive at mid inspiration. Regular rate and rhythm without murmur gallop or rub or increase P2 or edema.  Abd: no hsm, nl excursion. Ext warm without cyanosis or clubbing.         Assessment & Plan:

## 2011-05-01 NOTE — Patient Instructions (Signed)
Try tudorza one puff twice daily and stop atrovent and see how it effects your activity performance, if not benefit ok to change back to atrovent   Please schedule a follow up visit in 3 months but call sooner if needed

## 2011-05-02 NOTE — Assessment & Plan Note (Signed)
-   PFTs 05/04/05 FEV1 36% ratio 28% with 29% improvement after bronchodilators DLCO 48%  - PFT's 03/26/08 30% ratio 34 with 14% improvement after bronchodilators DLC0 38 %  - Start noct 02 at 2lpm 2008 and on 03/04/09 desat @ > 185 ft so rec wear with activtiy > rm to rm  - HFA 90% 05/01/11  GOLD IV and moderately symptomatic but no tendency to aecopd.  He would like to try tudorza instead of qid atrovent  The proper method of use, as well as anticipated side effects, of a metered-dose inhaler are discussed and demonstrated to the patient. Improved effectiveness after extensive coaching during this visit to a level of approximately  90%

## 2011-05-02 NOTE — Assessment & Plan Note (Signed)
-   02 2lpm at hs and with activity > room to room    - 02/15/2011   [facility] RA x one lap @ 185 stopped due to  desat > corrected on 2lpm  Adequate control on present rx, reviewed need to use 02 with more than room to room activities to maintain conditioning (presently avoiding exertion because he doesn't like to wear his 02)

## 2011-05-03 ENCOUNTER — Telehealth: Payer: Self-pay | Admitting: *Deleted

## 2011-05-03 DIAGNOSIS — M720 Palmar fascial fibromatosis [Dupuytren]: Secondary | ICD-10-CM | POA: Diagnosis not present

## 2011-05-03 DIAGNOSIS — M6530 Trigger finger, unspecified finger: Secondary | ICD-10-CM | POA: Diagnosis not present

## 2011-05-03 NOTE — Telephone Encounter (Signed)
LMTCB so can inform him of recs per MW that are stated below

## 2011-05-03 NOTE — Telephone Encounter (Signed)
Message copied by Christen Butter on Wed May 03, 2011 11:31 AM ------      Message from: Nyoka Cowden      Created: Tue May 02, 2011  6:48 AM       Call and remind him my rec was to use 02 with more than room to room walking so he can increase outside activities and maintain conditioning and let us know if his portable 02 is not working to his satisfaction and we can have Libbly troubleshoot other options

## 2011-05-03 NOTE — Telephone Encounter (Signed)
Spoke with pt and notified of recs per MW Pt verbalized understanding and denied any questions 

## 2011-05-03 NOTE — Telephone Encounter (Signed)
Pt returned call. [name]  

## 2011-05-05 ENCOUNTER — Telehealth: Payer: Self-pay | Admitting: Internal Medicine

## 2011-05-05 NOTE — Telephone Encounter (Signed)
Spoke with pt and advised to use tudorza in the am and then 12 hours later.

## 2011-05-09 ENCOUNTER — Telehealth: Payer: Self-pay | Admitting: Internal Medicine

## 2011-05-10 DIAGNOSIS — M6530 Trigger finger, unspecified finger: Secondary | ICD-10-CM | POA: Diagnosis not present

## 2011-05-10 NOTE — Telephone Encounter (Signed)
Done, and faxed .Kandice Hams

## 2011-05-30 ENCOUNTER — Encounter: Payer: Self-pay | Admitting: Neurosurgery

## 2011-05-30 ENCOUNTER — Telehealth: Payer: Self-pay | Admitting: Internal Medicine

## 2011-05-30 NOTE — Telephone Encounter (Signed)
Ok to stop [place] and resume atrovent - would use it in the neb at home qid and only use the hfa if he's away from home when the rx is due> if not better we need to see him by end of week

## 2011-05-30 NOTE — Telephone Encounter (Signed)
I spoke with pt and is aware of MW recs. He voiced his understanding and had no questions

## 2011-05-30 NOTE — Telephone Encounter (Signed)
Spoke with pt. He states that since starting tudorza, his breathing seems to be worse. He is asking if needs to start back on just atrovent, or atrovent and nebs. Please advise, thanks!

## 2011-05-31 ENCOUNTER — Ambulatory Visit (INDEPENDENT_AMBULATORY_CARE_PROVIDER_SITE_OTHER): Payer: Medicare Other | Admitting: Vascular Surgery

## 2011-05-31 ENCOUNTER — Ambulatory Visit (INDEPENDENT_AMBULATORY_CARE_PROVIDER_SITE_OTHER): Payer: Medicare Other | Admitting: Neurosurgery

## 2011-05-31 ENCOUNTER — Telehealth: Payer: Self-pay | Admitting: Internal Medicine

## 2011-05-31 ENCOUNTER — Encounter: Payer: Self-pay | Admitting: Neurosurgery

## 2011-05-31 VITALS — BP 142/70 | HR 58 | Resp 16 | Ht 69.0 in | Wt 151.6 lb

## 2011-05-31 DIAGNOSIS — I6529 Occlusion and stenosis of unspecified carotid artery: Secondary | ICD-10-CM

## 2011-05-31 NOTE — Telephone Encounter (Signed)
Form placed in MW lookat to sign

## 2011-05-31 NOTE — Progress Notes (Signed)
[facility] [place] HISTORY AND PHYSICAL   CC: Annual carotid duplex for known stenosis Referring Physician: Edilia Bo  History of Present Illness: 78 year old male patient of Dr. Adele Dan seen for annual carotid duplex for known stenosis. Patient has had no carotid intervention. Patient reports no signs or symptoms of CVA, TIA, amaurosis fugax or word finding difficulty. Patient does complain of some weakness in his arms or legs, mostly his hands and some dizziness on arising which is being investigated by his primary care.  Past Medical History  Diagnosis Date  . Hypertension   . S/P CABG x 4   . CAD (coronary artery disease)   . COPD (chronic obstructive pulmonary disease)   . Hyperlipidemia   . Chronic rhinitis   . Chronic respiratory failure   . Peripheral vascular disease     ROS: [x]  Positive   [ ]  Denies    General: [ ]  Weight loss, [ ]  Fever, [ ]  chills Neurologic: [x ] Dizziness, [ ]  Blackouts, [ ]  Seizure [ ]  Stroke, [ ]  "Mini stroke", [ ]  Slurred speech, [ ]  Temporary blindness; [x ] weakness in arms or legs, [ ]  Hoarseness Cardiac: [ ]  Chest pain/pressure, [ ]  Shortness of breath at rest [ ]  Shortness of breath with exertion, [ ]  Atrial fibrillation or irregular heartbeat Vascular: [ ]  Pain in legs with walking, [ ]  Pain in legs at rest, [ ]  Pain in legs at night,  [ ]  Non-healing ulcer, [ ]  Blood clot in vein/DVT,   Pulmonary: [ ]  Home oxygen, [ ]  Productive cough, [ ]  Coughing up blood, [ ]  Asthma,  [ ]  Wheezing Musculoskeletal:  [ ]  Arthritis, [ ]  Low back pain, [ ]  Joint pain Hematologic: [ ]  Easy Bruising, [ ]  Anemia; [ ]  Hepatitis Gastrointestinal: [ ]  Blood in stool, [ ]  Gastroesophageal Reflux/heartburn, [ ]  Trouble swallowing Urinary: [ ]  chronic Kidney disease, [ ]  on HD - [ ]  MWF or [ ]  TTHS, [ ]  Burning with urination, [ ]  Difficulty urinating Skin: [ ]  Rashes, [ ]  Wounds Psychological: [ ]  Anxiety, [ ]  Depression   Social  History History  Substance Use Topics  . Smoking status: Former Smoker -- 2.0 packs/day for 33 years    Types: Cigarettes    Quit date: 01/09/1981  . Smokeless tobacco: Not on file  . Alcohol Use: Yes     1-2 glasses of bourbon and wine daily with dinner    Family History Family History  Problem Relation Age of Onset  . Heart disease Mother   . Breast cancer Mother   . Cancer Mother     Breast cancer  . Hypertension Mother   . Other Mother     AAA  . Heart disease Father   . Heart attack Father   . Heart disease Brother   . Hyperlipidemia Brother   . Hypertension Brother   . Heart disease Brother   . Stroke Brother     x3, still living   . Peripheral vascular disease Brother   . Allergies Brother     Allergies  Allergen Reactions  . Cefdinir     REACTION: diarrhea  . Sulfonamide Derivatives     REACTION: facial/tongue swelling    Current Outpatient Prescriptions  Medication Sig Dispense Refill  . Aspirin Buf,AlHyd-MgHyd-CaCar, (ASCRIPTIN) 325 MG TABS Take 1 tablet by mouth daily.        Marland Kitchen atorvastatin (LIPITOR) 20 MG tablet Take 20 mg by mouth at  bedtime.        . candesartan (ATACAND) 8 MG tablet Take 1 tablet (8 mg total) by mouth daily.  30 tablet  11  . Cholecalciferol (VITAMIN D3) 1000 UNITS CAPS Take 1 capsule (1,000 Units total) by mouth daily.      . furosemide (LASIX) 20 MG tablet Take 1 tablet (20 mg total) by mouth daily as needed.  30 tablet  3  . guaiFENesin (MUCINEX) 600 MG 12 hr tablet 1-2 tablets every 12 hours as needed        . ipratropium (ATROVENT) 0.02 % nebulizer solution Take 2.5 mLs (500 mcg total) by nebulization 4 (four) times daily.      Marland Kitchen levalbuterol (XOPENEX HFA) 45 MCG/ACT inhaler Inhale 2 puffs into the lungs every 4 (four) hours as needed for wheezing or shortness of breath (PLAN B).      Marland Kitchen levalbuterol (XOPENEX) 1.25 MG/3ML nebulizer solution Take 1.25 mg by nebulization as needed.      . loratadine (CLARITIN) 10 MG tablet Take 10  mg by mouth daily as needed.        . metoprolol succinate (TOPROL-XL) 25 MG 24 hr tablet Take 25 mg by mouth daily.        . Multiple Vitamin (MULTIVITAMIN) tablet Take 1 tablet by mouth daily.        . ranitidine (ZANTAC) 150 MG tablet Take 1 tablet (150 mg total) by mouth 2 (two) times daily.      . SYMBICORT 160-4.5 MCG/ACT inhaler INHALE 2 PUFFS BY MOUTH TWICE DAILY  10.2 g  1  . Tamsulosin HCl (FLOMAX) 0.4 MG CAPS Take 0.4 mg by mouth Daily.      Marland Kitchen DISCONTD: levalbuterol (XOPENEX) 1.25 MG/3ML nebulizer solution Take 1.25 mg by nebulization every 4 (four) hours as needed for wheezing or shortness of breath (PLAN B).      . Aclidinium Bromide (TUDORZA PRESSAIR) 400 MCG/ACT AEPB Inhale 1 puff into the lungs 2 (two) times daily.  1 each  11  . ipratropium (ATROVENT HFA) 17 MCG/ACT inhaler Inhale 2 puffs into the lungs 4 (four) times daily.        Physical Examination  Filed Vitals:   05/31/11 1413  BP: 142/70  Pulse: 58  Resp: 16    Body mass index is 22.39 kg/(m^2).  General:  WDWN in NAD Gait: Normal HEENT: WNL Eyes: Pupils equal Pulmonary: normal non-labored breathing , without Rales, rhonchi,  wheezing Cardiac: RRR, without  Murmurs, rubs or gallops; Abdomen: soft, NT, no masses Skin: no rashes, ulcers noted  Vascular Exam Pulses: 2+ radial pulses bilaterally Carotid bruits: Carotid pulses to auscultation no bruits are heard Extremities without ischemic changes, no Gangrene , no cellulitis; no open wounds;  Musculoskeletal: no muscle wasting or atrophy   Neurologic: A&O X 3; Appropriate Affect ; SENSATION: normal; MOTOR FUNCTION:  moving all extremities equally. Speech is fluent/normal  Non-Invasive Vascular Imaging CAROTID DUPLEX 05/31/2011  Right ICA 40 - 59 % stenosis Left ICA 20 - 39 % stenosis   ASSESSMENT/PLAN: Asymptomatic carotid stenosis, we will have the patient return in one year for repeat carotid duplex. His questions were encouraged and answered, he is  in agreement with this plan.  Lauree Chandler ANP   Clinic MD: Edilia Bo

## 2011-06-01 NOTE — Telephone Encounter (Signed)
Pt aware and form mailed back to him

## 2011-06-01 NOTE — Telephone Encounter (Signed)
done

## 2011-06-03 ENCOUNTER — Other Ambulatory Visit: Payer: Self-pay | Admitting: Internal Medicine

## 2011-06-06 ENCOUNTER — Telehealth: Payer: Self-pay | Admitting: Internal Medicine

## 2011-06-06 NOTE — Telephone Encounter (Signed)
Pt aware. Ja Pistole, CMA  

## 2011-06-06 NOTE — Telephone Encounter (Signed)
Pt stated this has been happening for the past 4 months & doesn't understand what is going on.  Pt stated that he is now in need a refill as well & is completely out.  Would like to see if this can get straightened out.  Antionette Fairy

## 2011-06-06 NOTE — Telephone Encounter (Signed)
RX was sent on 06-03-11 with 3 refills- I called and confirmed with pharmacy. LMTCB to let patient know that he has RX at drugstore waiting as well as 3 refills but needs to make and keep OV with MW in July. Can get year RX if MW agrees then.

## 2011-06-07 NOTE — Procedures (Unsigned)
CAROTID DUPLEX EXAM  INDICATION:  Carotid stenosis.  HISTORY: Diabetes:  No. Cardiac:  CABG. Hypertension:  Yes. Smoking:  Previous. Previous Surgery:  No carotid intervention. CV History:  Currently experiencing overall weakness and dizziness when going from a sitting position to standing. Amaurosis Fugax No, Paresthesias Yes, Hemiparesis No                                      RIGHT             LEFT Brachial systolic pressure:         142               146 Brachial Doppler waveforms:         WNL               WNL Vertebral direction of flow:        Antegrade         Antegrade DUPLEX VELOCITIES (cm/sec) CCA peak systolic                   69                78 ECA peak systolic                   76                89 ICA peak systolic                   172               113 ICA end diastolic                   41                32 PLAQUE MORPHOLOGY:                  Heterogenous      Heterogenous PLAQUE AMOUNT:                      Moderate          Mild to moderate PLAQUE LOCATION:                    CCA/ICA           CCA/ICA  IMPRESSION: 1. Right internal carotid artery stenosis present in the 40-59% range. 2. Bilateral external carotid arteries appear patent. 3. Left internal carotid artery stenosis present in the 1-39% range     (high end of range). 4. Bilateral vertebral arteries are patent and antegrade. 5. Bilateral brachial pressure gradient could not be reproduced on     today's exam. 6. Essentially unchanged since previous study on 05/20/2010.  ___________________________________________ [name]. Edilia Bo, M.D.  SH/MEDQ  D:  05/31/2011  T:  05/31/2011  Job:  [id number]

## 2011-06-28 DIAGNOSIS — H1130 Conjunctival hemorrhage, unspecified eye: Secondary | ICD-10-CM | POA: Diagnosis not present

## 2011-06-28 DIAGNOSIS — H2510 Age-related nuclear cataract, unspecified eye: Secondary | ICD-10-CM | POA: Diagnosis not present

## 2011-06-28 DIAGNOSIS — H04129 Dry eye syndrome of unspecified lacrimal gland: Secondary | ICD-10-CM | POA: Diagnosis not present

## 2011-06-28 DIAGNOSIS — H01009 Unspecified blepharitis unspecified eye, unspecified eyelid: Secondary | ICD-10-CM | POA: Diagnosis not present

## 2011-07-05 ENCOUNTER — Other Ambulatory Visit: Payer: Self-pay | Admitting: Internal Medicine

## 2011-07-06 ENCOUNTER — Telehealth: Payer: Self-pay | Admitting: Internal Medicine

## 2011-07-06 NOTE — Telephone Encounter (Signed)
Per pharmacist, [facility] now has a generic and this is what the insurance will pay for. Verbal given to change to the generic. Called to notify the pt of this and he says he does not want the generic and will continue to pay out of pocket for the BRAND [facility]. Pharmacist made aware to fill BRAND NAME ONLY for the pt.

## 2011-09-03 ENCOUNTER — Other Ambulatory Visit: Payer: Self-pay | Admitting: Internal Medicine

## 2011-09-27 DIAGNOSIS — H0230 Blepharochalasis unspecified eye, unspecified eyelid: Secondary | ICD-10-CM | POA: Diagnosis not present

## 2011-09-27 DIAGNOSIS — H25049 Posterior subcapsular polar age-related cataract, unspecified eye: Secondary | ICD-10-CM | POA: Diagnosis not present

## 2011-09-27 DIAGNOSIS — H04129 Dry eye syndrome of unspecified lacrimal gland: Secondary | ICD-10-CM | POA: Diagnosis not present

## 2011-09-29 ENCOUNTER — Other Ambulatory Visit: Payer: Self-pay | Admitting: Internal Medicine

## 2011-10-04 ENCOUNTER — Ambulatory Visit (INDEPENDENT_AMBULATORY_CARE_PROVIDER_SITE_OTHER): Payer: Medicare Other | Admitting: Internal Medicine

## 2011-10-04 ENCOUNTER — Encounter: Payer: Self-pay | Admitting: Internal Medicine

## 2011-10-04 VITALS — BP 142/60 | HR 84 | Temp 97.8°F | Ht 69.0 in | Wt 150.0 lb

## 2011-10-04 DIAGNOSIS — J449 Chronic obstructive pulmonary disease, unspecified: Secondary | ICD-10-CM

## 2011-10-04 DIAGNOSIS — Z23 Encounter for immunization: Secondary | ICD-10-CM | POA: Diagnosis not present

## 2011-10-04 DIAGNOSIS — J310 Chronic rhinitis: Secondary | ICD-10-CM | POA: Diagnosis not present

## 2011-10-04 DIAGNOSIS — J9610 Chronic respiratory failure, unspecified whether with hypoxia or hypercapnia: Secondary | ICD-10-CM

## 2011-10-04 NOTE — Assessment & Plan Note (Signed)
-   rec ipatropium nasal spray since seemed to work well in past

## 2011-10-04 NOTE — Patient Instructions (Addendum)
Plan A = use automatically no matter what Symbicort followed by ATrovent each am and symbiocort again in 12 hours plus continue the atrovent though the day in between about every 4-6 hours  Only use your albuterol (xopenex hfa -puffer-  is Plan B,  Nebulizer is plan C) as a rescue medication to be used if you can't catch your breath by resting or doing a relaxed purse lip breathing pattern. The less you use it, the better it will work when you need it.   Please schedule a follow up visit in 3 months but call sooner if needed   Late add: Struggling a bit with implementation of action plans, need to review med calendar next ov

## 2011-10-04 NOTE — Assessment & Plan Note (Signed)
-   02 2lpm at hs and with activity > room to room    - 02/15/2011   [facility] RA x one lap @ 185 stopped due to  desat > corrected on 2lpm  Adequate control on present rx, reviewed rx

## 2011-10-04 NOTE — Assessment & Plan Note (Addendum)
-   PFTs 05/04/05 FEV1 36% ratio 28% with 29% improvement after bronchodilators DLCO 48%  - PFT's 03/26/08 30% ratio 34 with 14% improvement after bronchodilators DLC0 38 %  - Start noct 02 at 2lpm 2008 and on 03/04/09 desat @ > 185 ft so rec wear with activtiy > rm to rm  - HFA 90% 05/01/11 - Trial of tudorza 05/02/2011 > no better, changed back to atrovent neb (hfa when out)  The dpi's seem to aggravate his throat more than they help the upper airway  I had an extended discussion with the patient today lasting 15 to 20 minutes of a 25 minute visit on the following issues:     Each maintenance medication was reviewed in detail including most importantly the difference between maintenance and as needed and under what circumstances the prns are to be used.  Please see instructions for details which were reviewed in writing and the patient given a copy including a specific action plan A through C

## 2011-10-04 NOTE — Progress Notes (Signed)
Subjective:    Patient ID: [name], male    DOB: May 21, 1929, 78 y.o.   MRN: [id number]  HPI Summary: Pulmonary/ f/u ov try astepro for pnds   Primary Provider/Referring Provider: Theresia [name]   82 yowm last smoked 1983 severe COPD FEV1 of only 36% April 2007 with a 29% improvement after bronchodilators consistent with an asthmatic component. Baseline activity level--walk 1 mile daily with oxygen on. Maintained on Symbicort 160/4.13mcg 1 puff two times a day.   February 11, 2008 ov never took spiriva consistently. No variability to doe or sign cough. rec spiriva but w/in one week cp > MCH with neg cardiac wu, still having occ cp , positional, better with rubbing chest and relaxing.  Retry spiriva   October 15, 2008 rov follow up, about the same did not get along well with spiriva , discont. taking caused dry mouth. still definitely benefits from as needed xopenex.    February 18, 2010 -- ov cc cough with green/white mucus, wheezing, SOB x5days. OTC not helping. Cough is worse at night, keeping him up at times. Wheezing started yesterday. rex doxy, prednisone then admit 2/14 to [facility] and d/c 2/17 with ct c/w emphysema > d/c on zmax and prednisone tapered off as of March 03, 2010   March 03, 2010 ov cc  minimally improved sob and cough with green sputum and nasal congestion esp in am .. doex > slow adls  minimally better p nebs rec  See Patient Care Coordinator before leaving for sinus CT  Thickening only  Prilosec otc 20mg  30 min before bfast and zantac 150 at bedtime as long as coughing ( reflux is to cough what oxygen is to fire)  Augmentin 875 mg twice daily with water and eat yogurt for lunch  Prednisone 10mg  2 daily back to normal then 1 daily until return.   March 17, 2010 ov Cough and Dyspnea- some better, mucus is now white and less volume worse lying down, less need for rescue.  rec taper off prednisone >  5 days off  04/28/2010 ov/[name] no worse off prednisone,  Rare astepro, no  need for rescue or even using the atrovent/xop more than twice dailly - overall much better.   rec Ipatropium / xopenex are twice regulary with ability to increase to 4 x daily if breathing worsens   10/14/2010 f/u ov/[name] cc Pt states breathing worse with exertion x 1 month. Still has no cough but lots of mucus esp in am with  No purulent sputum. ent wants to try ipatropium and scopalamine to dry up his drippy nose - no longer following med calendar for action plan at bottom rec Change ipatropium to 4 x daily and just use Only use your xopenex as a rescue medication to be used if you can't catch your breath by resting or doing a relaxed purse lip breathing pattern. The less you use it, the better it will work when you need it.   Prednisone 10 mg take  4 each am x 2 days,   2 each am x 2 days,  1 each am x2days and stop   Please schedule a follow up office visit in 6 weeks, call sooner if needed but bring the medication calendar with you   11/25/2010 f/u ov/[name] cc breathing better, nasal drainage better.minimal cough. Doe at baselline.minimal use of rescue   rec Follow med calendar  03/10/2011 Follow up and Med review  Returns for follow up and med review. We reviewed his meds  and organized them into a med calendar with pt educaiton.   rec  05/01/2011 f/u ov/[name] cc 50 ft outside, one aisle at [facility], whole store with 02. No cough and minimal need for saba  Se[facility] at [facility] rec change ipatropium neb  to atrovent > no change doe. rec ry tudorza one puff twice daily and stop atrovent and see how it effects your activity performance, if not benefit ok to change back to atrovent   10/04/2011 f/u ov/[name] did not bring med calendar cc  Loosing ground on tudorza so went to atrovent neb and seemed to help. No obvious daytime variabilty or assoc chronic cough or cp or chest tightness, subjective wheeze overt sinus or hb symptoms. No unusual exp hx    Sleeping ok without nocturnal  or early am exacerbation   of respiratory  c/o's or need for noct saba. Also denies any obvious fluctuation of symptoms with weather or environmental changes or other aggravating or alleviating factors except as outlined above   ROS  The following are not active complaints unless bolded sore throat, dysphagia, dental problems, itching, sneezing,  nasal congestion or excess/ purulent secretions, ear ache,   fever, chills, sweats, unintended wt loss, pleuritic or exertional cp, hemoptysis,  orthopnea pnd or leg swelling, presyncope, palpitations, heartburn, abdominal pain, anorexia, nausea, vomiting, diarrhea  or change in bowel or urinary habits, change in stools or urine, dysuria,hematuria,  rash, arthralgias, visual complaints, headache, numbness weakness or ataxia or problems with walking or coordination,  change in mood/affect or memory.        Past Medical History:  CHRONIC RHINITIS (ICD-472.0)  - Sinus ct March 03, 2010 > thickening only, no acute changes  CHRONIC RESPIRATORY FAILURE (ICD-518.83)  - 02 rx since 2008  COPD UNSPECIFIED (ICD-496)....................................................................[name]  - PFTs 05/04/05 FEV1 36% ratio 28% with 29% improvement after bronchodilators DLCO 48%  - PFT's 03/26/08 30% ratio 34 with 14% improvement after bronchodilators DLC0 38 %  - Start noct 02 at 2lpm 2008 and on 03/04/09 desat @ > 185 ft so rec wear with activtiy > rm to rm  - [facility] June 30, 2008 100% > confirmed March 04, 2009  ISCHEMIC HEART DISEASE (ICD-414.9)  HYPERTENSION (ICD-401.9)  HYPERLIPIDEMIA (ICD-272.4)  SPN RUL by cxr August 19, 2009  COMPLEX MED REGIMEN-Meds reviewed with pt education and computerized med calendar 11/22/09 03/10/2011                     Objective:   Physical Exam  amb wm nad  wt 159 04/28/2010  > 10/14/2010  154 > 11/25/2010  155 > 05/01/2011 154> 10/04/2011 150 HEENT mild turbinate edema.  Oropharynx no thrush or excess pnd or cobblestoning.  No JVD or cervical  adenopathy. Mild accessory muscle hypertrophy. Trachea midline, nl thryroid. Chest was hyperinflated by percussion with diminished breath sounds and moderate increased exp time without wheeze. Hoover sign positive at mid inspiration. Regular rate and rhythm without murmur gallop or rub or increase P2 or edema.  Abd: no hsm, nl excursion. Ext warm without cyanosis or clubbing.         Assessment & Plan:

## 2011-10-10 DIAGNOSIS — N400 Enlarged prostate without lower urinary tract symptoms: Secondary | ICD-10-CM | POA: Diagnosis not present

## 2011-10-17 DIAGNOSIS — N401 Enlarged prostate with lower urinary tract symptoms: Secondary | ICD-10-CM | POA: Diagnosis not present

## 2011-10-17 DIAGNOSIS — R972 Elevated prostate specific antigen [PSA]: Secondary | ICD-10-CM | POA: Diagnosis not present

## 2011-10-20 DIAGNOSIS — R5381 Other malaise: Secondary | ICD-10-CM | POA: Diagnosis not present

## 2011-10-20 DIAGNOSIS — J309 Allergic rhinitis, unspecified: Secondary | ICD-10-CM | POA: Diagnosis not present

## 2011-10-20 DIAGNOSIS — R5383 Other fatigue: Secondary | ICD-10-CM | POA: Diagnosis not present

## 2011-10-27 DIAGNOSIS — R609 Edema, unspecified: Secondary | ICD-10-CM | POA: Diagnosis not present

## 2011-10-27 DIAGNOSIS — E782 Mixed hyperlipidemia: Secondary | ICD-10-CM | POA: Diagnosis not present

## 2011-10-27 DIAGNOSIS — J449 Chronic obstructive pulmonary disease, unspecified: Secondary | ICD-10-CM | POA: Diagnosis not present

## 2011-10-27 DIAGNOSIS — I2510 Atherosclerotic heart disease of native coronary artery without angina pectoris: Secondary | ICD-10-CM | POA: Diagnosis not present

## 2011-10-27 DIAGNOSIS — I10 Essential (primary) hypertension: Secondary | ICD-10-CM | POA: Diagnosis not present

## 2011-11-20 DIAGNOSIS — H2510 Age-related nuclear cataract, unspecified eye: Secondary | ICD-10-CM | POA: Diagnosis not present

## 2011-11-20 DIAGNOSIS — H2181 Floppy iris syndrome: Secondary | ICD-10-CM | POA: Diagnosis not present

## 2011-11-20 DIAGNOSIS — H25049 Posterior subcapsular polar age-related cataract, unspecified eye: Secondary | ICD-10-CM | POA: Diagnosis not present

## 2011-11-20 DIAGNOSIS — H18519 Endothelial corneal dystrophy, unspecified eye: Secondary | ICD-10-CM | POA: Diagnosis not present

## 2011-12-15 DIAGNOSIS — H10509 Unspecified blepharoconjunctivitis, unspecified eye: Secondary | ICD-10-CM | POA: Diagnosis not present

## 2011-12-15 DIAGNOSIS — H169 Unspecified keratitis: Secondary | ICD-10-CM | POA: Diagnosis not present

## 2011-12-15 DIAGNOSIS — Z961 Presence of intraocular lens: Secondary | ICD-10-CM | POA: Diagnosis not present

## 2011-12-18 ENCOUNTER — Ambulatory Visit: Payer: Medicare Other | Admitting: Internal Medicine

## 2011-12-18 DIAGNOSIS — H10509 Unspecified blepharoconjunctivitis, unspecified eye: Secondary | ICD-10-CM | POA: Diagnosis not present

## 2011-12-18 DIAGNOSIS — Z961 Presence of intraocular lens: Secondary | ICD-10-CM | POA: Diagnosis not present

## 2011-12-18 DIAGNOSIS — H169 Unspecified keratitis: Secondary | ICD-10-CM | POA: Diagnosis not present

## 2011-12-21 ENCOUNTER — Encounter: Payer: Self-pay | Admitting: Internal Medicine

## 2011-12-21 ENCOUNTER — Ambulatory Visit (INDEPENDENT_AMBULATORY_CARE_PROVIDER_SITE_OTHER): Payer: Medicare Other | Admitting: Internal Medicine

## 2011-12-21 VITALS — BP 154/64 | HR 67 | Temp 97.8°F | Ht 69.0 in | Wt 150.6 lb

## 2011-12-21 DIAGNOSIS — J310 Chronic rhinitis: Secondary | ICD-10-CM

## 2011-12-21 DIAGNOSIS — J449 Chronic obstructive pulmonary disease, unspecified: Secondary | ICD-10-CM | POA: Diagnosis not present

## 2011-12-21 DIAGNOSIS — J4489 Other specified chronic obstructive pulmonary disease: Secondary | ICD-10-CM

## 2011-12-21 DIAGNOSIS — J9610 Chronic respiratory failure, unspecified whether with hypoxia or hypercapnia: Secondary | ICD-10-CM | POA: Diagnosis not present

## 2011-12-21 NOTE — Progress Notes (Signed)
Subjective:    Patient ID: [name], male    DOB: 1929/03/03, 78 y.o.   MRN: [id number]  HPI Summary: Pulmonary/ f/u ov try astepro for pnds   Primary Provider/Referring Provider: [name]   82 yowm last smoked 1983 severe COPD FEV1 of only 36% April 2007 with a 29% improvement after bronchodilators consistent with an asthmatic component. Baseline activity level--walk 1 mile daily with oxygen on. Maintained on Symbicort 160/4.11mcg 1 puff two times a day.   February 11, 2008 ov never took spiriva consistently. No variability to doe or sign cough. rec spiriva but w/in one week cp > MCH with neg cardiac wu, still having occ cp , positional, better with rubbing chest and relaxing.  Retry spiriva   October 15, 2008 rov follow up, about the same did not get along well with spiriva , discont. taking caused dry mouth. still definitely benefits from as needed xopenex.    February 18, 2010 -- ov cc cough with green/white mucus, wheezing, SOB x5days. OTC not helping. Cough is worse at night, keeping him up at times. Wheezing started yesterday. rex doxy, prednisone then admit 2/14 to [facility] and d/c 2/17 with ct c/w emphysema > d/c on zmax and prednisone tapered off as of March 03, 2010   March 03, 2010 ov cc  minimally improved sob and cough with green sputum and nasal congestion esp in am .. doex > slow adls  minimally better p nebs rec  See Patient Care Coordinator before leaving for sinus CT  Thickening only  Prilosec otc 20mg  30 min before bfast and zantac 150 at bedtime as long as coughing ( reflux is to cough what oxygen is to fire)  Augmentin 875 mg twice daily with water and eat yogurt for lunch  Prednisone 10mg  2 daily back to normal then 1 daily until return.   March 17, 2010 ov Cough and Dyspnea- some better, mucus is now white and less volume worse lying down, less need for rescue.  rec taper off prednisone >  5 days off  04/28/2010 ov/Fallen Crisostomo no worse off prednisone,  Rare astepro, no  need for rescue or even using the atrovent/xop more than twice dailly - overall much better.   rec Ipatropium / xopenex are twice regulary with ability to increase to 4 x daily if breathing worsens   10/14/2010 f/u ov/Kaloni Bisaillon cc Pt states breathing worse with exertion x 1 month. Still has no cough but lots of mucus esp in am with  No purulent sputum. ent wants to try ipatropium and scopalamine to dry up his drippy nose - no longer following med calendar for action plan at bottom rec Change ipatropium to 4 x daily and just use Only use your xopenex as a rescue medication to be used if you can't catch your breath by resting or doing a relaxed purse lip breathing pattern. The less you use it, the better it will work when you need it.   Prednisone 10 mg take  4 each am x 2 days,   2 each am x 2 days,  1 each am x2days and stop   Please schedule a follow up office visit in 6 weeks, call sooner if needed but bring the medication calendar with you   11/25/2010 f/u ov/Trapper Meech cc breathing better, nasal drainage better.minimal cough. Doe at baselline.minimal use of rescue   rec Follow med calendar  03/10/2011 Follow up and Med review  Returns for follow up and med review. We reviewed his meds  and organized them into a med calendar with pt educaiton.   rec  05/01/2011 f/u ov/Chaitanya Amedee cc 50 ft outside, one aisle at [facility], whole store with 02. No cough and minimal need for saba  Seen at [facility] rec change ipatropium neb  to atrovent > no change doe. rec ry tudorza one puff twice daily and stop atrovent and see how it effects your activity performance, if not benefit ok to change back to atrovent   10/04/2011 f/u ov/Marybella Ethier did not bring med calendar cc  Loosing ground on tudorza so went to atrovent neb and seemed to help. rec Plan A = use automatically no matter what Symbicort followed by Atrovent each am and symbiocort again in 12 hours plus continue the atrovent though the day in between about every 4-6 hours  Only  use your albuterol (xopenex hfa -puffer-  is Plan B,  Nebulizer is plan C) as a rescue medication to be used if you can't catch your breath by resting or doing a relaxed purse lip breathing pattern. The less you use it, the better it will work when you need it.      Late add: Struggling a bit with implementation of action plans, need to review med calendar next ov   12/21/2011 f/u ov/Kosta Schnitzler cc doe loosing ground = one aisle slow off 02,  Does ok on 02 at grocery, but even on 02 on treadmill at usual grade (flat) and speed (1.4 mph) on 02 x 20 min where used to to 1.5 x 30 min.  qnasal helped nasal discharge but increased bp so stopped it and drainage came back.   No obvious daytime variabilty or assoc chronic cough or cp or chest tightness, subjective wheeze overt sinus or hb symptoms. No unusual exp hx    Sleeping ok without nocturnal  or early am exacerbation  of respiratory  c/o's or need for noct saba. Also denies any obvious fluctuation of symptoms with weather or environmental changes or other aggravating or alleviating factors except as outlined above   ROS  The following are not active complaints unless bolded sore throat, dysphagia, dental problems, itching, sneezing,  nasal congestion or excess/ purulent secretions, ear ache,   fever, chills, sweats, unintended wt loss, pleuritic or exertional cp, hemoptysis,  orthopnea pnd or leg swelling, presyncope, palpitations, heartburn, abdominal pain, anorexia, nausea, vomiting, diarrhea  or change in bowel or urinary habits, change in stools or urine, dysuria,hematuria,  rash, arthralgias, visual complaints, headache, numbness weakness or ataxia or problems with walking or coordination,  change in mood/affect or memory.        Past Medical History:  CHRONIC RHINITIS (ICD-472.0)  - Sinus ct March 03, 2010 > thickening only, no acute changes  CHRONIC RESPIRATORY FAILURE (ICD-518.83)  - 02 rx since 2008  COPD UNSPECIFIED  (ICD-496)....................................................................[name]  - PFTs 05/04/05 FEV1 36% ratio 28% with 29% improvement after bronchodilators DLCO 48%  - PFT's 03/26/08 30% ratio 34 with 14% improvement after bronchodilators DLC0 38 %  - Start noct 02 at 2lpm 2008 and on 03/04/09 desat @ > 185 ft so rec wear with activtiy > rm to rm  - [facility] June 30, 2008 100% > confirmed March 04, 2009  ISCHEMIC HEART DISEASE (ICD-414.9)  HYPERTENSION (ICD-401.9)  HYPERLIPIDEMIA (ICD-272.4)  SPN RUL by cxr August 19, 2009  COMPLEX MED REGIMEN-Meds reviewed with pt education and computerized med calendar 11/22/09 03/10/2011  Objective:   Physical Exam  amb wm nad  wt 159 04/28/2010  > 10/14/2010 154  > 10/04/2011 150> 12/21/2011  150 HEENT mild turbinate edema.  Oropharynx no thrush or excess pnd or cobblestoning.  No JVD or cervical adenopathy. Mild accessory muscle hypertrophy. Trachea midline, nl thryroid. Chest was hyperinflated by percussion with diminished breath sounds and moderate increased exp time without wheeze. Hoover sign positive at mid inspiration. Regular rate and rhythm without murmur gallop or rub or increase P2 or edema.  Abd: no hsm, nl excursion. Ext warm without cyanosis or clubbing.         Assessment & Plan:

## 2011-12-21 NOTE — Patient Instructions (Addendum)
Restart qnasal and monitor your blood pressure   Always wear 02 2lpm at bedtime and when walking more than room to room at home  See calendar for specific medication instructions and bring it back for each and every office visit for every healthcare provider you see.  Without it,  you may not receive the best quality medical care that we feel you deserve.  You will note that the calendar groups together  your maintenance  medications that are timed at particular times of the day.  Think of this as your checklist for what your doctor has instructed you to do until your next evaluation to see what benefit  there is  to staying on a consistent group of medications intended to keep you well.  The other group at the bottom is entirely up to you to use as you see fit  for specific symptoms that may arise between visits that require you to treat them on an as needed basis.  Think of this as your action plan or "what if" list.   Separating the top medications from the bottom group is fundamental to providing you adequate care going forward.     Please schedule a follow up office visit in 6 weeks, call sooner if needed

## 2011-12-22 NOTE — Assessment & Plan Note (Signed)
PFTs 05/04/05 FEV1 36% ratio 28% with 29% improvement after bronchodilators DLCO 48%  - PFT's 03/26/08 30% ratio 34 with 14% improvement after bronchodilators DLC0 38 %  - Start noct 02 at 2lpm 2008 and on 03/04/09 desat @ > 185 ft so rec wear with activtiy > rm to rm  - HFA 90% 05/01/11 - Trial of tudorza 05/02/2011 > no better, changed back to atrovent neb (hfa when out)   GOLD III/IV with minimal decline in activity tol on 02, much worse off 02 but explained / reviewed prev rec he wear 02 with any activity > room to room walking, which he failed to follow.    Each maintenance medication was reviewed in detail including most importantly the difference between maintenance and as needed and under what circumstances the prns are to be used.  Please see instructions for details which were reviewed in writing and the patient given a copy.

## 2011-12-22 NOTE — Assessment & Plan Note (Signed)
-   02 2lpm at hs and with activity > room to room    - 02/15/2011   Walked RA x one lap @ 185 stopped due to  desat > corrected on 2lpm  Adequate control on present rx, reviewed  

## 2011-12-22 NOTE — Assessment & Plan Note (Signed)
-   rec rechallenge with qnasal 12/21/11

## 2011-12-25 DIAGNOSIS — H169 Unspecified keratitis: Secondary | ICD-10-CM | POA: Diagnosis not present

## 2012-01-01 DIAGNOSIS — H169 Unspecified keratitis: Secondary | ICD-10-CM | POA: Diagnosis not present

## 2012-01-01 DIAGNOSIS — H04129 Dry eye syndrome of unspecified lacrimal gland: Secondary | ICD-10-CM | POA: Diagnosis not present

## 2012-01-01 DIAGNOSIS — H10509 Unspecified blepharoconjunctivitis, unspecified eye: Secondary | ICD-10-CM | POA: Diagnosis not present

## 2012-01-29 ENCOUNTER — Encounter: Payer: Self-pay | Admitting: Internal Medicine

## 2012-01-29 ENCOUNTER — Ambulatory Visit (INDEPENDENT_AMBULATORY_CARE_PROVIDER_SITE_OTHER): Payer: Medicare Other | Admitting: Internal Medicine

## 2012-01-29 VITALS — BP 120/60 | HR 84 | Temp 97.0°F | Ht 69.0 in | Wt 149.0 lb

## 2012-01-29 DIAGNOSIS — J449 Chronic obstructive pulmonary disease, unspecified: Secondary | ICD-10-CM

## 2012-01-29 DIAGNOSIS — J310 Chronic rhinitis: Secondary | ICD-10-CM | POA: Diagnosis not present

## 2012-01-29 DIAGNOSIS — J9610 Chronic respiratory failure, unspecified whether with hypoxia or hypercapnia: Secondary | ICD-10-CM | POA: Diagnosis not present

## 2012-01-29 NOTE — Progress Notes (Signed)
Subjective:    Patient ID: [name], male    DOB: May 21, 1929, 79 y.o.   MRN: [id number]  HPI Summary: Pulmonary/ f/u ov try astepro for pnds   Primary Provider/Referring Provider: [name]   82 yowm last smoked 1983 severe COPD FEV1 of only 36% April 2007 with a 29% improvement after bronchodilators consistent with an asthmatic component. Baseline activity level--walk 1 mile daily with oxygen on. Maintained on Symbicort 160/4.13mcg 1 puff two times a day.   February 11, 2008 ov never took spiriva consistently. No variability to doe or sign cough. rec spiriva but w/in one week cp > MCH with neg cardiac wu, still having occ cp , positional, better with rubbing chest and relaxing.  Retry spiriva   October 15, 2008 rov follow up, about the same did not get along well with spiriva , discont. taking caused dry mouth. still definitely benefits from as needed xopenex.    February 18, 2010 -- ov cc cough with green/white mucus, wheezing, SOB x5days. OTC not helping. Cough is worse at night, keeping him up at times. Wheezing started yesterday. rex doxy, prednisone then admit 2/14 to [facility] and d/c 2/17 with ct c/w emphysema > d/c on zmax and prednisone tapered off as of March 03, 2010   March 03, 2010 ov cc  minimally improved sob and cough with green sputum and nasal congestion esp in am .. doex > slow adls  minimally better p nebs rec  See Patient Care Coordinator before leaving for sinus CT  Thickening only  Prilosec otc 20mg  30 min before bfast and zantac 150 at bedtime as long as coughing ( reflux is to cough what oxygen is to fire)  Augmentin 875 mg twice daily with water and eat yogurt for lunch  Prednisone 10mg  2 daily back to normal then 1 daily until return.   March 17, 2010 ov Cough and Dyspnea- some better, mucus is now white and less volume worse lying down, less need for rescue.  rec taper off prednisone >  5 days off  04/28/2010 ov/[name] no worse off prednisone,  Rare astepro, no  need for rescue or even using the atrovent/xop more than twice dailly - overall much better.   rec Ipatropium / xopenex are twice regulary with ability to increase to 4 x daily if breathing worsens   10/14/2010 f/u ov/[name] cc Pt states breathing worse with exertion x 1 month. Still has no cough but lots of mucus esp in am with  No purulent sputum. ent wants to try ipatropium and scopalamine to dry up his drippy nose - no longer following med calendar for action plan at bottom rec Change ipatropium to 4 x daily and just use Only use your xopenex as a rescue medication to be used if you can't catch your breath by resting or doing a relaxed purse lip breathing pattern. The less you use it, the better it will work when you need it.   Prednisone 10 mg take  4 each am x 2 days,   2 each am x 2 days,  1 each am x2days and stop   Please schedule a follow up office visit in 6 weeks, call sooner if needed but bring the medication calendar with you   11/25/2010 f/u ov/[name] cc breathing better, nasal drainage better.minimal cough. Doe at [place].minimal use of rescue   rec Follow med calendar  03/10/2011 Follow up and Med review  Returns for follow up and med review. We reviewed his meds  and organized them into a med calendar with pt educaiton.   rec  05/01/2011 f/u ov/[name] cc 50 ft outside, one aisle at [facility], whole store with 02. No cough and minimal need for saba  Seen at [facility] rec change ipatropium neb  to atrovent > no change doe. rec ry tudorza one puff twice daily and stop atrovent and see how it effects your activity performance, if not benefit ok to change back to atrovent   10/04/2011 f/u ov/[name] did not bring med calendar cc  Loosing ground on tudorza so went to atrovent neb and seemed to help. rec Plan A = use automatically no matter what Symbicort followed by Atrovent each am and symbiocort again in 12 hours plus continue the atrovent though the day in between about every 4-6 hours  Only  use your albuterol (xopenex hfa -puffer-  is Plan B,  Nebulizer is plan C) as a rescue medication to be used if you can't catch your breath by resting or doing a relaxed purse lip breathing pattern. The less you use it, the better it will work when you need it.      Late add: Struggling a bit with implementation of action plans, need to review med calendar next ov   12/21/2011 f/u ov/[name] cc doe loosing ground = one aisle slow off 02,  Does ok on 02 at grocery, but even on 02 on treadmill at usual grade (flat) and speed (1.4 mph) on 02 x 20 min where used to to 1.5 x 30 min.  qnasal helped nasal discharge but increased bp so stopped it and drainage came back. rec Restart qnasal and monitor your blood pressure  Always wear 02 2lpm at bedtime and when walking more than room to room at home    01/29/2012 f/u ov/[name] cc slt better doe, fought off cold ok x sev weeks prior to OV  and back to rare if ever use xopenex  maint complaint is persistent x years watery rhinitis worse with cold air exp and with eating, no better on qnasal.     No obvious daytime variabilty or assoc chronic cough or cp or chest tightness, subjective wheeze overt sinus or hb symptoms. No unusual exp hx    Sleeping ok without nocturnal  or early am exacerbation  of respiratory  c/o's or need for noct saba. Also denies any obvious fluctuation of symptoms with weather or environmental changes or other aggravating or alleviating factors except as outlined above   ROS  The following are not active complaints unless bolded sore throat, dysphagia, dental problems, itching, sneezing,  nasal congestion or excess watery nasal discharg/ purulent secretions, ear ache,   fever, chills, sweats, unintended wt loss, pleuritic or exertional cp, hemoptysis,  orthopnea pnd or leg swelling, presyncope, palpitations, heartburn, abdominal pain, anorexia, nausea, vomiting, diarrhea  or change in bowel or urinary habits, change in stools or urine,  dysuria,hematuria,  rash, arthralgias, visual complaints, headache, numbness weakness or ataxia or problems with walking or coordination,  change in mood/affect or memory.        Past Medical History:  CHRONIC RHINITIS (ICD-472.0)  - Sinus ct March 03, 2010 > thickening only, no acute changes  CHRONIC RESPIRATORY FAILURE (ICD-518.83)  - 02 rx since 2008  COPD UNSPECIFIED (ICD-496)....................................................................[name]  - PFTs 05/04/05 FEV1 36% ratio 28% with 29% improvement after bronchodilators DLCO 48%  - PFT's 03/26/08 30% ratio 34 with 14% improvement after bronchodilators DLC0 38 %  - Start noct 02 at 2lpm  2008 and on 03/04/09 desat @ > 185 ft so rec wear with activtiy > rm to rm  - [facility] June 30, 2008 100% > confirmed March 04, 2009  ISCHEMIC HEART DISEASE (ICD-414.9)  HYPERTENSION (ICD-401.9)  HYPERLIPIDEMIA (ICD-272.4)  SPN RUL by cxr August 19, 2009  COMPLEX MED REGIMEN-Meds reviewed with pt education and computerized med calendar 11/22/09 03/10/2011                     Objective:   Physical Exam  amb wm nad  wt 159 04/28/2010  > 10/14/2010 154  > 10/04/2011 150> 12/21/2011  150 > 149 01/29/2012  HEENT mild turbinate edema.  Oropharynx no thrush or excess pnd or cobblestoning.  No JVD or cervical adenopathy. Mild accessory muscle hypertrophy. Trachea midline, nl thryroid. Chest was hyperinflated by percussion with diminished breath sounds and moderate increased exp time without wheeze. Hoover sign positive at mid inspiration. Regular rate and rhythm without murmur gallop or rub or increase P2 or edema.  Abd: no hsm, nl excursion. Ext warm without cyanosis or clubbing.         Assessment & Plan:

## 2012-01-29 NOTE — Patient Instructions (Addendum)
For drippy nose try chlortrimeton 4 mg every 6 hours as needed instead of clariton (though may cause drowsiness)  See calendar for specific medication instructions and bring it back for each and every office visit for every healthcare provider you see.  Without it,  you may not receive the best quality medical care that we feel you deserve.  You will note that the calendar groups together  your maintenance  medications that are timed at particular times of the day.  Think of this as your checklist for what your doctor has instructed you to do until your next evaluation to see what benefit  there is  to staying on a consistent group of medications intended to keep you well.  The other group at the bottom is entirely up to you to use as you see fit  for specific symptoms that may arise between visits that require you to treat them on an as needed basis.  Think of this as your action plan or "what if" list.   Separating the top medications from the bottom group is fundamental to providing you adequate care going forward.     Please schedule a follow up visit in 3 months but call sooner if needed

## 2012-01-30 ENCOUNTER — Other Ambulatory Visit: Payer: Self-pay | Admitting: Internal Medicine

## 2012-01-30 ENCOUNTER — Other Ambulatory Visit: Payer: Self-pay | Admitting: Adult Health

## 2012-02-04 NOTE — Assessment & Plan Note (Signed)
No better with topical rx, will try h1 per guidelines but warned about systemic side effects

## 2012-02-04 NOTE — Assessment & Plan Note (Addendum)
-   PFTs 05/04/05 FEV1 36% ratio 28% with 29% improvement after bronchodilators DLCO 48%  - PFT's 03/26/08 30% ratio 34 with 14% improvement after bronchodilators DLC0 38 %  - Start noct 02 at 2lpm 2008 and on 03/04/09 desat @ > 185 ft so rec wear with activtiy > rm to rm  - HFA 90% 05/01/11 - Trial of tudorza 05/02/2011 > no better, changed back to atrovent neb (hfa when out)   Adequate control on present rx, reviewed no other options at this point     Each maintenance medication was reviewed in detail including most importantly the difference between maintenance and as needed and under what circumstances the prns are to be used. This was done in the context of a medication calendar review which provided the patient with a user-friendly unambiguous mechanism for medication administration and reconciliation and provides an action plan for all active problems. It is critical that this be shown to every doctor  for modification during the office visit if necessary so the patient can use it as a working document.

## 2012-02-04 NOTE — Assessment & Plan Note (Signed)
-   02 2lpm at hs and with activity > room to room    - 02/15/2011   [facility] RA x one lap @ 185 stopped due to  desat > corrected on 2lpm  Adequate control on present rx, reviewed

## 2012-02-28 DIAGNOSIS — D485 Neoplasm of uncertain behavior of skin: Secondary | ICD-10-CM | POA: Diagnosis not present

## 2012-02-28 DIAGNOSIS — L570 Actinic keratosis: Secondary | ICD-10-CM | POA: Diagnosis not present

## 2012-02-28 DIAGNOSIS — L408 Other psoriasis: Secondary | ICD-10-CM | POA: Diagnosis not present

## 2012-04-01 ENCOUNTER — Ambulatory Visit: Payer: Medicare Other | Admitting: Internal Medicine

## 2012-04-04 ENCOUNTER — Ambulatory Visit: Payer: Medicare Other | Admitting: Adult Health

## 2012-04-05 ENCOUNTER — Ambulatory Visit (INDEPENDENT_AMBULATORY_CARE_PROVIDER_SITE_OTHER): Payer: Medicare Other | Admitting: Adult Health

## 2012-04-05 ENCOUNTER — Encounter: Payer: Self-pay | Admitting: Adult Health

## 2012-04-05 ENCOUNTER — Other Ambulatory Visit (INDEPENDENT_AMBULATORY_CARE_PROVIDER_SITE_OTHER): Payer: Medicare Other

## 2012-04-05 ENCOUNTER — Ambulatory Visit (INDEPENDENT_AMBULATORY_CARE_PROVIDER_SITE_OTHER)
Admission: RE | Admit: 2012-04-05 | Discharge: 2012-04-05 | Disposition: A | Discharge status: Home / Self Care | Payer: Medicare Other | Source: Ambulatory Visit | Attending: Adult Health | Admitting: Adult Health

## 2012-04-05 VITALS — BP 140/80 | HR 59 | Temp 97.8°F | Ht 69.0 in | Wt 154.6 lb

## 2012-04-05 DIAGNOSIS — R0989 Other specified symptoms and signs involving the circulatory and respiratory systems: Secondary | ICD-10-CM | POA: Diagnosis not present

## 2012-04-05 DIAGNOSIS — R0609 Other forms of dyspnea: Secondary | ICD-10-CM | POA: Diagnosis not present

## 2012-04-05 DIAGNOSIS — J449 Chronic obstructive pulmonary disease, unspecified: Secondary | ICD-10-CM

## 2012-04-05 DIAGNOSIS — R609 Edema, unspecified: Secondary | ICD-10-CM

## 2012-04-05 DIAGNOSIS — R0600 Dyspnea, unspecified: Secondary | ICD-10-CM

## 2012-04-05 LAB — BASIC METABOLIC PANEL
BUN: 21 mg/dL (ref 6–23)
CO2: 31 mEq/L (ref 19–32)
Calcium: 9 mg/dL (ref 8.4–10.5)
Chloride: 102 mEq/L (ref 96–112)
Creatinine, Ser: 0.9 mg/dL (ref 0.4–1.5)
GFR: 83.99 mL/min (ref >60.00)
Glucose, Bld: 90 mg/dL (ref 70–99)
Potassium: 4.4 mEq/L (ref 3.5–5.1)
Sodium: 138 mEq/L (ref 135–145)

## 2012-04-05 LAB — BRAIN NATRIURETIC PEPTIDE: Pro B Natriuretic peptide (BNP): 95 pg/mL (ref 0.0–100.0)

## 2012-04-05 MED ORDER — FUROSEMIDE 20 MG PO TABS: 20.0000 mg | ORAL_TABLET | Freq: Every day | ORAL | Status: DC | PRN

## 2012-04-05 NOTE — Patient Instructions (Addendum)
Lasix 20mg   2 tabs daily for 3 days then As needed  For leg swelling.  I will call with labs and xray results.  follow up Dr. Sherene Sires  In 6 weeks and As needed   Please contact office for sooner follow up if symptoms do not improve or worsen or seek emergency care

## 2012-04-05 NOTE — Assessment & Plan Note (Addendum)
Exacerbation, with suspected fluid overload.  Check xray and labs with bnp   Plan  Lasix 20mg   2 tabs daily for 3 days then As needed  For leg swelling.  I will call with labs and xray results.  follow up Dr. Sherene Sires  In 6 weeks and As needed   Please contact office for sooner follow up if symptoms do not improve or worsen or seek emergency care

## 2012-04-05 NOTE — Addendum Note (Signed)
Addended by: Boone Master E on: 04/05/2012 05:17 PM   Modules accepted: Orders

## 2012-04-05 NOTE — Progress Notes (Signed)
Subjective:    Patient ID: [name], male    DOB: 1929/05/22, 79 y.o.   MRN: [id number]  HPI Summary: Pulmonary/ f/u ov try astepro for pnds   Primary Provider/Referring Provider: [name]   82 yowm last smoked 1983 severe COPD FEV1 of only 36% April 2007 with a 29% improvement after bronchodilators consistent with an asthmatic component. Baseline activity level--walk 1 mile daily with oxygen on. Maintained on Symbicort 160/4.48mcg 1 puff two times a day.   February 11, 2008 ov never took spiriva consistently. No variability to doe or sign cough. rec spiriva but w/in one week cp > MCH with neg cardiac wu, still having occ cp , positional, better with rubbing chest and relaxing.  Retry spiriva   October 15, 2008 rov follow up, about the same did not get along well with spiriva , discont. taking caused dry mouth. still definitely benefits from as needed xopenex.    February 18, 2010 -- ov cc cough with green/white mucus, wheezing, SOB x5days. OTC not helping. Cough is worse at night, keeping him up at times. Wheezing started yesterday. rex doxy, prednisone then admit 2/14 to [facility] and d/c 2/17 with ct c/w emphysema > d/c on zmax and prednisone tapered off as of March 03, 2010   March 03, 2010 ov cc  minimally improved sob and cough with green sputum and nasal congestion esp in am .. doex > slow adls  minimally better p nebs rec  See Patient Care Coordinator before leaving for sinus CT  Thickening only  Prilosec otc 20mg  30 min before bfast and zantac 150 at bedtime as long as coughing ( reflux is to cough what oxygen is to fire)  Augmentin 875 mg twice daily with water and eat yogurt for lunch  Prednisone 10mg  2 daily back to normal then 1 daily until return.   March 17, 2010 ov Cough and Dyspnea- some better, mucus is now white and less volume worse lying down, less need for rescue.  rec taper off prednisone >  5 days off  04/28/2010 ov/Wert no worse off prednisone,  Rare astepro, no  need for rescue or even using the atrovent/xop more than twice dailly - overall much better.   rec Ipatropium / xopenex are twice regulary with ability to increase to 4 x daily if breathing worsens   10/14/2010 f/u ov/Wert cc Pt states breathing worse with exertion x 1 month. Still has no cough but lots of mucus esp in am with  No purulent sputum. ent wants to try ipatropium and scopalamine to dry up his drippy nose - no longer following med calendar for action plan at bottom rec Change ipatropium to 4 x daily and just use Only use your xopenex as a rescue medication to be used if you can't catch your breath by resting or doing a relaxed purse lip breathing pattern. The less you use it, the better it will work when you need it.   Prednisone 10 mg take  4 each am x 2 days,   2 each am x 2 days,  1 each am x2days and stop   Please schedule a follow up office visit in 6 weeks, call sooner if needed but bring the medication calendar with you   11/25/2010 f/u ov/Wert cc breathing better, nasal drainage better.minimal cough. Doe at [place].minimal use of rescue   rec Follow med calendar  03/10/2011 Follow up and Med review  Returns for follow up and med review. We reviewed his meds  and organized them into a med calendar with pt educaiton.   rec  05/01/2011 f/u ov/Wert cc 50 ft outside, one aisle at [facility], whole store with 02. No cough and minimal need for saba  Seen at [facility] rec change ipatropium neb  to atrovent > no change doe. rec ry tudorza one puff twice daily and stop atrovent and see how it effects your activity performance, if not benefit ok to change back to atrovent   10/04/2011 f/u ov/Wert did not bring med calendar cc  Loosing ground on tudorza so went to atrovent neb and seemed to help. rec Plan A = use automatically no matter what Symbicort followed by Atrovent each am and symbiocort again in 12 hours plus continue the atrovent though the day in between about every 4-6 hours  Only  use your albuterol (xopenex hfa -puffer-  is Plan B,  Nebulizer is plan C) as a rescue medication to be used if you can't catch your breath by resting or doing a relaxed purse lip breathing pattern. The less you use it, the better it will work when you need it.      Late add: Struggling a bit with implementation of action plans, need to review med calendar next ov   12/21/2011 f/u ov/Wert cc doe loosing ground = one aisle slow off 02,  Does ok on 02 at grocery, but even on 02 on treadmill at usual grade (flat) and speed (1.4 mph) on 02 x 20 min where used to to 1.5 x 30 min.  qnasal helped nasal discharge but increased bp so stopped it and drainage came back. rec Restart qnasal and monitor your blood pressure  Always wear 02 2lpm at bedtime and when walking more than room to room at home    01/29/2012 f/u ov/Wert cc slt better doe, fought off cold ok x sev weeks prior to OV  and back to rare if ever use xopenex  maint complaint is persistent x years watery rhinitis worse with cold air exp and with eating, no better on qnasal.     No obvious daytime variabilty or assoc chronic cough or cp or chest tightness, subjective wheeze overt sinus or hb symptoms. No unusual exp hx  >chlortrimeton As needed    04/05/2012 Acute OV  Complains of  increased SOB, some wheezing and tightness in chest x3 weeks. Patient denies any discolored mucus, or fever, chest pain, orthopnea. Has had increased lower extremity swelling. Has been worse over the last few weeks. Did try Lasix 20 mg daily, but did not see much improvement. Patient denies any calf pain, hemoptysis, or known injury. Has had quite a bit of nasal congestion, and drainage. He denies sinus pain or pressure.     Past Medical History:  CHRONIC RHINITIS (ICD-472.0)  - Sinus ct March 03, 2010 > thickening only, no acute changes  CHRONIC RESPIRATORY FAILURE (ICD-518.83)  - 02 rx since 2008  COPD UNSPECIFIED  (ICD-496)....................................................................[name]  - PFTs 05/04/05 FEV1 36% ratio 28% with 29% improvement after bronchodilators DLCO 48%  - PFT's 03/26/08 30% ratio 34 with 14% improvement after bronchodilators DLC0 38 %  - Start noct 02 at 2lpm 2008 and on 03/04/09 desat @ > 185 ft so rec wear with activtiy > rm to rm  - [facility] June 30, 2008 100% > confirmed March 04, 2009  ISCHEMIC HEART DISEASE (ICD-414.9)  HYPERTENSION (ICD-401.9)  HYPERLIPIDEMIA (ICD-272.4)  SPN RUL by cxr August 19, 2009  COMPLEX MED REGIMEN-Meds reviewed with pt education  and computerized med calendar 11/22/09 03/10/2011             ROS Constitutional:   No  weight loss, night sweats,  Fevers, chills, fatigue, or  lassitude.  HEENT:   No headaches,  Difficulty swallowing,  Tooth/dental problems, or  Sore throat,                No sneezing, itching, ear ache,  +nasal congestion, post nasal drip,   CV:  No chest pain,  Orthopnea, PND, swelling in lower extremities, anasarca, dizziness, palpitations, syncope.   GI  No heartburn, indigestion, abdominal pain, nausea, vomiting, diarrhea, change in bowel habits, loss of appetite, bloody stools.   Resp: no chest wall deformity  Skin: no rash or lesions.  GU: no dysuria, change in color of urine, no urgency or frequency.  No flank pain, no hematuria   MS:  No joint pain or swelling.  No decreased range of motion.  No back pain.  Psych:  No change in mood or affect. No depression or anxiety.  No memory loss.             Objective:   Physical Exam  amb wm nad  wt 159 04/28/2010  > 10/14/2010 154  > 10/04/2011 150> 12/21/2011  150 > 149 01/29/2012 >154 04/05/2012  HEENT mild turbinate edema.  Oropharynx no thrush or excess pnd or cobblestoning.  No JVD or cervical adenopathy. Mild accessory muscle hypertrophy. Trachea midline, nl thryroid. Diminished BS in bases, no wheezing Regular rate and rhythm without murmur gallop or rub ,  1+edema .  Abd: no hsm, nl excursion. Ext warm without cyanosis or clubbing.         Assessment & Plan:

## 2012-04-10 DIAGNOSIS — K3189 Other diseases of stomach and duodenum: Secondary | ICD-10-CM | POA: Diagnosis not present

## 2012-04-10 DIAGNOSIS — I951 Orthostatic hypotension: Secondary | ICD-10-CM | POA: Diagnosis not present

## 2012-04-10 DIAGNOSIS — R1013 Epigastric pain: Secondary | ICD-10-CM | POA: Diagnosis not present

## 2012-04-10 DIAGNOSIS — R609 Edema, unspecified: Secondary | ICD-10-CM | POA: Diagnosis not present

## 2012-04-10 NOTE — Progress Notes (Signed)
Quick Note:  LMOM TCB x1. ______ 

## 2012-04-10 NOTE — Progress Notes (Signed)
Quick Note:  Patient returned call. Advised of lab results / recs as stated by TP. Pt verbalized understanding and denied any questions. ______ 

## 2012-04-10 NOTE — Progress Notes (Signed)
Quick Note:  Patient returned call. Advised of cxr results / recs as stated by TP. Pt verbalized understanding and denied any questions. ______ 

## 2012-05-09 ENCOUNTER — Other Ambulatory Visit: Payer: Self-pay | Admitting: *Deleted

## 2012-05-09 DIAGNOSIS — R269 Unspecified abnormalities of gait and mobility: Secondary | ICD-10-CM | POA: Diagnosis not present

## 2012-05-10 ENCOUNTER — Other Ambulatory Visit (INDEPENDENT_AMBULATORY_CARE_PROVIDER_SITE_OTHER): Payer: Medicare Other | Admitting: Vascular Surgery

## 2012-05-10 DIAGNOSIS — I6529 Occlusion and stenosis of unspecified carotid artery: Secondary | ICD-10-CM | POA: Diagnosis not present

## 2012-05-13 ENCOUNTER — Other Ambulatory Visit: Payer: Self-pay | Admitting: *Deleted

## 2012-05-15 ENCOUNTER — Encounter: Payer: Self-pay | Admitting: Surgery

## 2012-05-15 DIAGNOSIS — I25810 Atherosclerosis of coronary artery bypass graft(s) without angina pectoris: Secondary | ICD-10-CM | POA: Diagnosis not present

## 2012-05-15 DIAGNOSIS — I959 Hypotension, unspecified: Secondary | ICD-10-CM | POA: Diagnosis not present

## 2012-05-17 ENCOUNTER — Ambulatory Visit: Payer: Medicare Other | Admitting: Internal Medicine

## 2012-05-22 ENCOUNTER — Encounter: Payer: Self-pay | Admitting: Internal Medicine

## 2012-05-22 ENCOUNTER — Ambulatory Visit (INDEPENDENT_AMBULATORY_CARE_PROVIDER_SITE_OTHER): Payer: Medicare Other | Admitting: Internal Medicine

## 2012-05-22 VITALS — BP 150/82 | HR 81 | Temp 97.9°F | Ht 67.0 in | Wt 148.0 lb

## 2012-05-22 DIAGNOSIS — J310 Chronic rhinitis: Secondary | ICD-10-CM | POA: Diagnosis not present

## 2012-05-22 DIAGNOSIS — J9610 Chronic respiratory failure, unspecified whether with hypoxia or hypercapnia: Secondary | ICD-10-CM

## 2012-05-22 DIAGNOSIS — J449 Chronic obstructive pulmonary disease, unspecified: Secondary | ICD-10-CM

## 2012-05-22 NOTE — Progress Notes (Signed)
Subjective:    Patient ID: [name], male    DOB: 1929-10-23, 79 y.o.   MRN: [id number]  HPI Summary: Pulmonary/ f/u ov try astepro for pnds   Primary Provider/Referring Provider: [name]   82 yowm last smoked 1983 severe COPD FEV1 of only 36% April 2007 with a 29% improvement after bronchodilators consistent with an asthmatic component. Baseline activity level--walk 1 mile daily with oxygen on. Maintained on Symbicort 160/4.15mcg 1 puff two times a day.   February 11, 2008 ov never took spiriva consistently. No variability to doe or sign cough. rec spiriva but w/in one week cp > MCH with neg cardiac wu, still having occ cp , positional, better with rubbing chest and relaxing.  Retry spiriva   October 15, 2008 rov follow up, about the same did not get along well with spiriva , discont. taking caused dry mouth. still definitely benefits from as needed xopenex.    February 18, 2010 -- ov cc cough with green/white mucus, wheezing, SOB x5days. OTC not helping. Cough is worse at night, keeping him up at times. Wheezing started yesterday. rex doxy, prednisone then admit 2/14 to [facility] and d/c 2/17 with ct c/w emphysema > d/c on zmax and prednisone tapered off as of March 03, 2010   March 03, 2010 ov cc  minimally improved sob and cough with green sputum and nasal congestion esp in am .. doex > slow adls  minimally better p nebs rec  See Patient Care Coordinator before leaving for sinus CT  Thickening only  Prilosec otc 20mg  30 min before bfast and zantac 150 at bedtime as long as coughing ( reflux is to cough what oxygen is to fire)  Augmentin 875 mg twice daily with water and eat yogurt for lunch  Prednisone 10mg  2 daily back to normal then 1 daily until return.   March 17, 2010 ov Cough and Dyspnea- some better, mucus is now white and less volume worse lying down, less need for rescue.  rec taper off prednisone >  5 days off  04/28/2010 ov/[name] no worse off prednisone,  Rare astepro, no  need for rescue or even using the atrovent/xop more than twice dailly - overall much better.   rec Ipatropium / xopenex are twice regulary with ability to increase to 4 x daily if breathing worsens   10/14/2010 f/u ov/[name] cc Pt states breathing worse with exertion x 1 month. Still has no cough but lots of mucus esp in am with  No purulent sputum. ent wants to try ipatropium and scopalamine to dry up his drippy nose - no longer following med calendar for action plan at bottom rec Change ipatropium to 4 x daily and just use Only use your xopenex as a rescue medication to be used if you can't catch your breath by resting or doing a relaxed purse lip breathing pattern. The less you use it, the better it will work when you need it.   Prednisone 10 mg take  4 each am x 2 days,   2 each am x 2 days,  1 each am x2days and stop   Please schedule a follow up office visit in 6 weeks, call sooner if needed but bring the medication calendar with you   11/25/2010 f/u ov/[name] cc breathing better, nasal drainage better.minimal cough. Doe at [place].minimal use of rescue   rec Follow med calendar  03/10/2011 Follow up and Med review  Returns for follow up and med review. We reviewed his meds  and organized them into a med calendar with pt educaiton.   rec  05/01/2011 f/u ov/[name] cc 50 ft outside, one aisle at [facility], whole store with 02. No cough and minimal need for saba  Seen at [facility] rec change ipatropium neb  to atrovent > no change doe. rec ry tudorza one puff twice daily and stop atrovent and see how it effects your activity performance, if not benefit ok to change back to atrovent   10/04/2011 f/u ov/[name] did not bring med calendar cc  Loosing ground on tudorza so went to atrovent neb and seemed to help. rec Plan A = use automatically no matter what Symbicort followed by Atrovent each am and symbiocort again in 12 hours plus continue the atrovent though the day in between about every 4-6 hours  Only  use your albuterol (xopenex hfa -puffer-  is Plan B,  Nebulizer is plan C) as a rescue medication to be used if you can't catch your breath by resting or doing a relaxed purse lip breathing pattern. The less you use it, the better it will work when you need it.      Late add: Struggling a bit with implementation of action plans, need to review med calendar next ov   12/21/2011 f/u ov/[name] cc doe loosing ground = one aisle slow off 02,  Does ok on 02 at grocery, but even on 02 on treadmill at usual grade (flat) and speed (1.4 mph) on 02 x 20 min where used to to 1.5 x 30 min.  qnasal helped nasal discharge but increased bp so stopped it and drainage came back. rec Restart qnasal and monitor your blood pressure  Always wear 02 2lpm at bedtime and when walking more than room to room at home    01/29/2012 f/u ov/[name] cc slt better doe, fought off cold ok x sev weeks prior to OV  and back to rare if ever use xopenex  maint complaint is persistent x years watery rhinitis worse with cold air exp and with eating, no better on qnasal.     No obvious daytime variabilty or assoc chronic cough or cp or chest tightness, subjective wheeze overt sinus or hb symptoms. No unusual exp hx  >chlortrimeton As needed  > did not try   04/05/2012 Acute OV  Complains of  increased SOB, some wheezing and tightness in chest x3 weeks. Patient denies any discolored mucus, or fever, chest pain, orthopnea. Has had increased lower extremity swelling. Has been worse over the last few weeks. Did try Lasix 20 mg daily, but did not see much improvement. Patient denies any calf pain, hemoptysis, or known injury. Has had quite a bit of nasal congestion, and drainage. He denies sinus pain or pressure. rec Lasix 20mg   2 tabs daily for 3 days then As needed  For leg swelling.      05/22/2012 f/u ov/[name] copd/ resp failure on 02 2lpm sleeping and with activity Chief Complaint  Patient presents with  . Follow-up    Breathing  improved since the last visit, not back at his baseline yet. No new co's today.     Sense of pnds worst in am never expectorates, better for only 5 min p "does his trick" to clear his throat.  Rare use of xopenex hfa daytime, never needs neb saba  No obvious daytime variabilty or assoc excess mucus production or cp or chest tightness, subjective wheeze overt sinus or hb symptoms. No unusual exp hx or h/o childhood pna/  asthma or premature birth to his knowledge.   Sleeping ok without nocturnal  or early am exacerbation  of respiratory  c/o's or need for noct saba. Also denies any obvious fluctuation of symptoms with weather or environmental changes or other aggravating or alleviating factors except as outlined above   Current Medications, Allergies, Past Medical History, Past Surgical History, Family History, and Social History were reviewed in [employer] record.  ROS  The following are not active complaints unless bolded sore throat, dysphagia, dental problems, itching, sneezing,  nasal congestion or excess/ purulent secretions, ear ache,   fever, chills, sweats, unintended wt loss, pleuritic or exertional cp, hemoptysis,  orthopnea pnd or leg swelling, presyncope, palpitations, heartburn, abdominal pain, anorexia, nausea, vomiting, diarrhea  or change in bowel or urinary habits, change in stools or urine, dysuria,hematuria,  rash, arthralgias, visual complaints, headache, numbness weakness or ataxia or problems with walking or coordination,  change in mood/affect or memory.        Past Medical History:  CHRONIC RHINITIS (ICD-472.0)  - Sinus ct March 03, 2010 > thickening only, no acute changes  CHRONIC RESPIRATORY FAILURE (ICD-518.83)  - 02 rx since 2008  COPD UNSPECIFIED (ICD-496)....................................................................[name]  - PFTs 05/04/05 FEV1 36% ratio 28% with 29% improvement after bronchodilators DLCO 48%  - PFT's 03/26/08 30%  ratio 34 with 14% improvement after bronchodilators DLC0 38 %  - Start noct 02 at 2lpm 2008 and on 03/04/09 desat @ > 185 ft so rec wear with activtiy > rm to rm  - [facility] June 30, 2008 100% > confirmed March 04, 2009  ISCHEMIC HEART DISEASE (ICD-414.9)  HYPERTENSION (ICD-401.9)  HYPERLIPIDEMIA (ICD-272.4)  SPN RUL by cxr August 19, 2009  COMPLEX MED REGIMEN-Meds reviewed with pt education and computerized med calendar 11/22/09 03/10/2011                            Objective:   Physical Exam  amb wm nad with nl vitals though bp a bit high wt 159 04/28/2010  > 10/14/2010 154  > 10/04/2011 150> 12/21/2011  150 > 149 01/29/2012 >154 04/05/2012 > 05/22/2012 148  HEENT mild turbinate edema.  Oropharynx no thrush or excess pnd or cobblestoning.  No JVD or cervical adenopathy. Mild accessory muscle hypertrophy. Trachea midline, nl thryroid. Diminished BS in bases, no wheezing Regular rate and rhythm without murmur gallop or rub , 1+edema .  Abd: no hsm, nl excursion. Ext warm without cyanosis or clubbing.       04/08/12 cxr  No active disease. No significant change. Mild hyperinflation.    Assessment & Plan:

## 2012-05-22 NOTE — Patient Instructions (Addendum)
Take zantac 150 mg and chlortrimeton 4 mg at bedtime  To see what effect if any it has on your am congestion in your throat and nose  Please schedule a follow up visit in 3 months but call sooner if needed

## 2012-05-23 NOTE — Assessment & Plan Note (Signed)
-   02/15/2011   Walked RA x one lap @ 185 stopped due to  desat > corrected on 2lpm   05/23/2012  rx =  02 2lpm at hs and with activity > room to room    Each maintenance medication was reviewed in detail including most importantly the difference between maintenance and as needed and under what circumstances the prns are to be used.  Please see instructions for details which were reviewed in writing and the patient given a copy.

## 2012-05-23 NOTE — Assessment & Plan Note (Signed)
-   rec rechallenge with qnasal 12/21/11  - Trial of 1st gen h1 and hs h2 rec 05/23/2012

## 2012-05-23 NOTE — Assessment & Plan Note (Signed)
-   PFTs 05/04/05 FEV1 36% ratio 28% with 29% improvement after bronchodilators DLCO 48%  - PFT's 03/26/08 30% ratio 34 with 14% improvement after bronchodilators DLC0 38 %  - Start noct 02 at 2lpm 2008 and on 03/04/09 desat @ > 185 ft so rec wear with activtiy > rm to rm  - HFA 90% 05/01/11 - Trial of tudorza 05/02/2011 > no better, changed back to atrovent neb (hfa when out)   Adequate control on present rx, reviewed

## 2012-05-24 DIAGNOSIS — Z961 Presence of intraocular lens: Secondary | ICD-10-CM | POA: Diagnosis not present

## 2012-05-24 DIAGNOSIS — H18599 Other hereditary corneal dystrophies, unspecified eye: Secondary | ICD-10-CM | POA: Diagnosis not present

## 2012-05-24 DIAGNOSIS — H2510 Age-related nuclear cataract, unspecified eye: Secondary | ICD-10-CM | POA: Diagnosis not present

## 2012-05-28 ENCOUNTER — Ambulatory Visit: Payer: Medicare Other | Admitting: Neurology

## 2012-05-29 DIAGNOSIS — I25810 Atherosclerosis of coronary artery bypass graft(s) without angina pectoris: Secondary | ICD-10-CM | POA: Diagnosis not present

## 2012-05-29 DIAGNOSIS — I959 Hypotension, unspecified: Secondary | ICD-10-CM | POA: Diagnosis not present

## 2012-05-29 DIAGNOSIS — I10 Essential (primary) hypertension: Secondary | ICD-10-CM | POA: Diagnosis not present

## 2012-05-30 ENCOUNTER — Other Ambulatory Visit: Payer: Medicare Other

## 2012-05-30 ENCOUNTER — Ambulatory Visit: Payer: Medicare Other | Admitting: Neurosurgery

## 2012-06-04 ENCOUNTER — Telehealth: Payer: Self-pay | Admitting: Neurology

## 2012-06-04 DIAGNOSIS — I25810 Atherosclerosis of coronary artery bypass graft(s) without angina pectoris: Secondary | ICD-10-CM | POA: Diagnosis not present

## 2012-06-07 ENCOUNTER — Ambulatory Visit: Payer: Medicare Other | Admitting: Neurology

## 2012-06-13 ENCOUNTER — Ambulatory Visit: Payer: Medicare Other | Admitting: Neurology

## 2012-06-14 DIAGNOSIS — I959 Hypotension, unspecified: Secondary | ICD-10-CM | POA: Diagnosis not present

## 2012-06-16 ENCOUNTER — Other Ambulatory Visit: Payer: Self-pay | Admitting: Internal Medicine

## 2012-06-20 DIAGNOSIS — I959 Hypotension, unspecified: Secondary | ICD-10-CM | POA: Diagnosis not present

## 2012-06-28 DIAGNOSIS — R5381 Other malaise: Secondary | ICD-10-CM | POA: Diagnosis not present

## 2012-06-28 DIAGNOSIS — D649 Anemia, unspecified: Secondary | ICD-10-CM | POA: Diagnosis not present

## 2012-06-28 DIAGNOSIS — R609 Edema, unspecified: Secondary | ICD-10-CM | POA: Diagnosis not present

## 2012-06-28 DIAGNOSIS — I951 Orthostatic hypotension: Secondary | ICD-10-CM | POA: Diagnosis not present

## 2012-06-28 DIAGNOSIS — R5383 Other fatigue: Secondary | ICD-10-CM | POA: Diagnosis not present

## 2012-07-02 ENCOUNTER — Encounter: Payer: Self-pay | Admitting: Neurology

## 2012-07-02 ENCOUNTER — Ambulatory Visit (INDEPENDENT_AMBULATORY_CARE_PROVIDER_SITE_OTHER): Payer: Medicare Other | Admitting: Neurology

## 2012-07-02 VITALS — BP 142/78 | HR 78 | Temp 97.0°F | Ht 68.0 in | Wt 150.0 lb

## 2012-07-02 DIAGNOSIS — I951 Orthostatic hypotension: Secondary | ICD-10-CM

## 2012-07-02 DIAGNOSIS — R55 Syncope and collapse: Secondary | ICD-10-CM | POA: Diagnosis not present

## 2012-07-02 DIAGNOSIS — R269 Unspecified abnormalities of gait and mobility: Secondary | ICD-10-CM

## 2012-07-02 NOTE — Patient Instructions (Addendum)
I do want to suggest a few things today:  Remember to drink plenty of fluid, eat healthy meals and do not skip any meals. Try to eat protein with a every meal and eat a healthy snack such as fruit or nuts in between meals. Try to keep a regular sleep-wake schedule and try to exercise daily, particularly in the form of walking, 20-30 minutes a day, if you can.   As far as your medications are concerned, I would like to suggest no changes.    As far as diagnostic testing: MRA and MRI brain  I would like to see you back in 3 months, sooner if we need to. Please call us with any interim questions, concerns, problems, updates or refill requests.  Please also call us for any test results so we can go over those with you on the phone. Brett Canales is my clinical assistant and will answer any of your questions and relay your messages to me and also relay most of my messages to you.  Our phone number is [phone]. We also have an after hours call service for urgent matters and there is a physician on-call for urgent questions. For any emergencies you know to call 911 or go to the nearest emergency room.

## 2012-07-02 NOTE — Progress Notes (Signed)
Subjective:    Patient ID: [name] is a 79 y.o. male.  HPI  Huston Foley, MD, PhD [facility] [address] [address] [place], [place] [address]  Dear Dr. Tenny Craw,   I saw your patient, [name], upon your kind request in my neurologic clinic today for initial consultation of his gait disorder. The patient is unaccompanied today. As you know, [name] is a very pleasant 79 year old right-handed gentleman with an underlying medical history of heart disease, status post bypass surgery in September 2003, hypertension, COPD, hyperlipidemia, peripheral vascular disease, cerebrovascular disease, bipolar affective disorder and carotid disease, who has been experiencing unsteadiness while walking for the past few months. Thankfully he has not fallen recently, but did have a black out spell some 3 years ago, falling backwards in the dining room, landing on the carpet with brief LOC, but no head injury. His current medications are Atacand, aspirin, vitamin D, Claritin, multivitamin, Symbicort, Nasonex, Toprol-XL, nitroglycerin as needed, Lipitor.  He has had a decrease in his toprol XL to 12.5 mg once daily and Atacand in 4 mg daily from 8 mg.  He reports having had quite low BP values at times first thing in the morning. He was seen by ENT, endo, and cardiology. He was told, that his adrenal function was normal. He had a sleep study in the past and was told he has no OSA.  There is no family Hx of AD, or PD, but his older brother, age 31 has had some confusion.  The patient is the youngest of 4 boys and his oldest brother disappeared at age 28, his other brother past away 18 mo ago at 66 with a brain tumor. He has been exercising regularly on the treadmill but lately has been coming back for fear of falling. He has also reduced his strengthening and weight lifting exercises d/t feeling of exhaustion.  His Past Medical History Is Significant For: Past Medical History   Diagnosis Date  . Hypertension   . S/P CABG x 4   . CAD (coronary artery disease)   . COPD (chronic obstructive pulmonary disease)   . Hyperlipidemia   . Chronic rhinitis   . Chronic respiratory failure   . Peripheral vascular disease     His Past Surgical History Is Significant For: Past Surgical History  Procedure Laterality Date  . Coronary artery bypass graft  2003  . Cataract extraction  11-20-11    His Family History Is Significant For: Family History  Problem Relation Age of Onset  . Heart disease Mother   . Breast cancer Mother   . Cancer Mother     Breast cancer  . Hypertension Mother   . Other Mother     AAA  . Heart disease Father   . Heart attack Father   . Heart disease Brother   . Hyperlipidemia Brother   . Hypertension Brother   . Heart disease Brother   . Stroke Brother     x3, still living   . Peripheral vascular disease Brother   . Allergies Brother     His Social History Is Significant For: History   Social History  . Marital Status: Married    Spouse Name: N/A    Number of Children: 2  . Years of Education: N/A   Occupational History  . 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 Smoking status: Former Smoker -- 2.00 packs/day for  33 years    Types: Cigarettes    Quit date: 01/09/1981  . Smokeless tobacco: Never Used  . Alcohol Use: Yes     Comment: 1.5-3 oz daily  . Drug Use: No  . Sexually Active: None   Other Topics Concern  . None   Social History Narrative   Pt lives at home with his spouse.          His Allergies Are:  Allergies  Allergen Reactions  . Cefdinir     REACTION: diarrhea  . Sulfonamide Derivatives     REACTION: facial/tongue swelling  :   His Current Medications Are:  Outpatient Encounter Prescriptions as of 07/02/2012  Medication Sig Dispense Refill  . Aspirin Buf,AlHyd-MgHyd-CaCar, (ASCRIPTIN) 325 MG TABS Take 1 tablet by mouth daily.        Marland Kitchen  atorvastatin (LIPITOR) 20 MG tablet Take 20 mg by mouth at bedtime.        . ATROVENT HFA 17 MCG/ACT inhaler INHALE 2 PUFFS INTO THE LUNGS 4 TIMES A DAY AS NEEDED FOR WHEEZING  12.9 g  3  . candesartan (ATACAND) 4 MG tablet Take 4 mg by mouth daily.       . Cholecalciferol (VITAMIN D3) 1000 UNITS CAPS Take 1 capsule (1,000 Units total) by mouth daily.      . furosemide (LASIX) 20 MG tablet Take 1 tablet (20 mg total) by mouth daily as needed.  30 tablet  3  . guaiFENesin (MUCINEX) 600 MG 12 hr tablet 1-2 tablets every 12 hours as needed        . ipratropium (ATROVENT) 0.02 % nebulizer solution Take 2.5 mLs (500 mcg total) by nebulization 4 (four) times daily.      Marland Kitchen levalbuterol (XOPENEX HFA) 45 MCG/ACT inhaler Inhale 2 puffs into the lungs every 4 (four) hours as needed for wheezing or shortness of breath.  15 g  11  . levalbuterol (XOPENEX) 1.25 MG/3ML nebulizer solution Take 1.25 mg by nebulization as needed.      . loratadine (CLARITIN) 10 MG tablet Take 10 mg by mouth daily as needed.        . metoprolol succinate (TOPROL-XL) 25 MG 24 hr tablet Take 25 mg by mouth daily. 1/2 tab daily      . Multiple Vitamin (MULTIVITAMIN) tablet Take 1 tablet by mouth daily.        Marland Kitchen NASONEX 50 MCG/ACT nasal spray INSTILL 2 SPRAYS IN EACH NOSTRIL DAILY AS NEEDED  17 g  5  . ranitidine (ZANTAC) 150 MG tablet Take 150 mg by mouth 2 (two) times daily as needed.      . SYMBICORT 160-4.5 MCG/ACT inhaler INHALE 2 PUFFS BY MOUTH TWICE DAILY  10.2 g  11  . Tamsulosin HCl (FLOMAX) 0.4 MG CAPS Take 0.4 mg by mouth Daily.       No facility-administered encounter medications on file as of 07/02/2012.   Review of Systems  HENT:       Ringing in ears  Eyes:       Blurred vision  Respiratory: Positive for shortness of breath.   Cardiovascular: Positive for leg swelling.  Gastrointestinal: Positive for constipation.  Endocrine: Positive for cold intolerance.  Musculoskeletal: Positive for myalgias and arthralgias.   Allergic/Immunologic: Positive for environmental allergies.  Neurological: Positive for dizziness, weakness and numbness.       Sleepiness  Hematological: Bruises/bleeds easily.  Psychiatric/Behavioral:       Anxiety, decreased energy    Objective:  Neurologic Exam  Physical Exam Physical Examination:   Filed Vitals:   07/02/12 1321  BP: 168/77  Pulse: 74  Temp: 97 F (36.1 C)   He does significant blood pressure drop upon standing.  General Examination: The patient is a very pleasant 79 y.o. male in no acute distress. He appears well-developed and well-nourished and very well groomed.   HEENT: Normocephalic, atraumatic, pupils are equal, round and reactive to light and accommodation. Funduscopic exam is normal with sharp disc margins noted. Status post cataract repair on the right is noted. I do have difficulty visualizing the left fundus to to cataract. Extraocular tracking is good without limitation to gaze excursion or nystagmus noted. Normal smooth pursuit is noted. Hearing is grossly intact. Tympanic membranes are clear bilaterally. Face is symmetric with normal facial animation and normal facial sensation. Speech is clear with no dysarthria noted. There is no hypophonia. There is no lip, neck/head, jaw or voice tremor. Neck is supple with full range of passive and active motion. There are no carotid bruits on auscultation. Oropharynx exam reveals: mild mouth dryness, adequate dental hygiene and no significant airway crowding. Mallampati is class II. Tongue protrudes centrally and palate elevates symmetrically.   Chest: Clear to auscultation without wheezing, rhonchi or crackles noted.  Heart: S1+S2+0, regular and normal without murmurs, rubs or gallops noted.   Abdomen: Soft, non-tender and non-distended with normal bowel sounds appreciated on auscultation.  Extremities: There is 1+ pitting edema in the distal lower extremities bilaterally around both ankles .   Skin: Warm  and dry without trophic changes noted. There are no varicose veins.  Musculoskeletal: exam reveals no obvious joint deformities, tenderness or joint swelling or erythema.   Neurologically:  Mental status: The patient is awake, alert and oriented in all 4 spheres. His memory, attention, language and knowledge are appropriate. There is no aphasia, agnosia, apraxia or anomia. Speech is clear with normal prosody and enunciation. Thought process is linear. Mood is congruent and affect is normal.  Cranial nerves are as described above under HEENT exam. In addition, shoulder shrug is normal with equal shoulder height noted. Motor exam: Normal bulk, strength and tone is noted. There is no drift, tremor or rebound. Romberg is negative. Reflexes are 2+ throughout, but absent in the ankles. Fine motor skills are intact with normal finger taps, normal hand movements, normal rapid alternating patting, normal foot taps and normal foot agility.  Cerebellar testing shows no dysmetria or intention tremor on finger to nose testing. There is no truncal or gait ataxia.  Sensory exam is intact to light touch, pinprick, vibration, temperature sense and proprioception in the upper and lower extremities.  Gait, station and balance are unremarkable. No veering to one side is noted. No leaning to one side is noted. Posture is age-appropriate and stance is narrow based. He walks cautiously but no limp is noted. He does have a slight giveaway initially on the right side and reports problem with his right hip. No problems turning are noted. He turns en bloc. Tandem walk is unremarkable. Intact toe and heel stance is noted.               Assessment and Plan:   Assessment and Plan:   In summary, MACY LINGENFELTER is a very pleasant 79 y.o.-year old male with a history of unsteadiness of his gait which most likely is a multifactorial problem, due to advancing age. Fluctuating blood pressure values, lower back pain which by the way he  has been reporting  for the past 4-5 months and given his history of heart disease and peripheral vascular disease there is the possibility of atherosclerotic gait disorder as well. I would like to proceed with a brain MRI, particularly also because there is a family history brain tumor. I will do the MRI without contrast for screening and also add a brain MRA. He recently had carotid Doppler studies on 05/10/2012 which showed less than 40% stenosis in the left internal carotid artery and 40-59% stenosis of the right internal carotid artery, not enough to explain his symptoms. Also this was not much changed from May 2013. I advised him of this. I would like to see him back in 3 months for recheck. We will call him with his MRI and MRA results in the interim. He has had some changes in his blood pressure medication regimen which may still take time to take full effect. He is encouraged to continue exercising but I would like for him to use a stationary bike rather than the treadmill for safety. He was in agreement. I asked him to call with any interim questions, concerns, problems or updates and test results.   Thank you very much for allowing me to participate in the care of this nice patient. If I can be of any further assistance to you please do not hesitate to call me at [phone].  Sincerely,   Huston Foley, MD, PhD

## 2012-07-04 DIAGNOSIS — R109 Unspecified abdominal pain: Secondary | ICD-10-CM | POA: Diagnosis not present

## 2012-07-05 ENCOUNTER — Other Ambulatory Visit: Payer: Self-pay | Admitting: Family Medicine

## 2012-07-05 DIAGNOSIS — R1011 Right upper quadrant pain: Secondary | ICD-10-CM

## 2012-07-09 ENCOUNTER — Ambulatory Visit
Admission: RE | Admit: 2012-07-09 | Discharge: 2012-07-09 | Disposition: A | Discharge status: Home / Self Care | Payer: Medicare Other | Source: Ambulatory Visit | Attending: Family Medicine | Admitting: Family Medicine

## 2012-07-09 DIAGNOSIS — R1011 Right upper quadrant pain: Secondary | ICD-10-CM

## 2012-07-09 DIAGNOSIS — N200 Calculus of kidney: Secondary | ICD-10-CM | POA: Diagnosis not present

## 2012-07-09 DIAGNOSIS — D649 Anemia, unspecified: Secondary | ICD-10-CM | POA: Diagnosis not present

## 2012-07-13 ENCOUNTER — Ambulatory Visit
Admission: RE | Admit: 2012-07-13 | Discharge: 2012-07-13 | Disposition: A | Discharge status: Home / Self Care | Payer: Medicare Other | Source: Ambulatory Visit | Attending: Neurology | Admitting: Neurology

## 2012-07-13 ENCOUNTER — Ambulatory Visit
Admission: RE | Admit: 2012-07-13 | Discharge: 2012-07-13 | Disposition: A | Discharge status: Home / Self Care | Payer: Managed Care, Other (non HMO) | Source: Ambulatory Visit | Attending: Neurology | Admitting: Neurology

## 2012-07-13 DIAGNOSIS — R269 Unspecified abnormalities of gait and mobility: Secondary | ICD-10-CM

## 2012-07-13 DIAGNOSIS — I951 Orthostatic hypotension: Secondary | ICD-10-CM

## 2012-07-13 DIAGNOSIS — R55 Syncope and collapse: Secondary | ICD-10-CM

## 2012-07-15 ENCOUNTER — Other Ambulatory Visit: Payer: Self-pay | Admitting: Internal Medicine

## 2012-07-16 ENCOUNTER — Telehealth: Payer: Self-pay | Admitting: Internal Medicine

## 2012-07-16 NOTE — Telephone Encounter (Signed)
Fine with me

## 2012-07-16 NOTE — Progress Notes (Signed)
Quick Note:  Please call and advise patient that his brain MRA showed no significant tightening of his intracranial arteries. No further action is required on this test at this time. Thanks, Huston Foley, MD, PhD [facility] ([facility]) ______

## 2012-07-16 NOTE — Telephone Encounter (Signed)
Called spoke with patient, advised MW Dorette Grate rx Pt would like to pick this up today and reported that he will be here before the office closes Rx hand-written on rx pad for:  Oxygen 2lpm with exertion and at bedtime Rx placed on MW's desk for signature  **will need copy of rx for scan

## 2012-07-16 NOTE — Telephone Encounter (Signed)
Done

## 2012-07-16 NOTE — Progress Notes (Signed)
Quick Note:  Please call and advise patient that there is no significant change reported of his brain MRI. Findings were compared to a MRI from 2009 and they show stable atrophy, that is some volume loss or shrinkage. Thankfully, findings have been stable over the past for 5 years. No further action is required at this time. Thanks, Huston Foley, MD, PhD [facility] ______

## 2012-07-16 NOTE — Telephone Encounter (Signed)
Called, spoke with pt.  He is leaving tomorrow am to go on a trip.  He uses o2 at 2 lpm with exertion and qhs.  Pt uses [facility], but there are no [facility] facilities near where pt will be traveling.  Pt is requesting written rx for o2 to take with him incase something happens to the equipment that [facility] has given him to travel with.  He would like to pick rx up today.  Dr. Sherene Sires, are you ok with this?  Pls advise.  Thank you.

## 2012-07-17 NOTE — Progress Notes (Signed)
Quick Note:  Spoke with patient and relayed the results of their MRI and MRA as well as Dr Teofilo Pod advise or recommendations. The patient was also reminded of any future appointments. The patient understood and had no questions.  ______

## 2012-07-30 ENCOUNTER — Telehealth: Payer: Self-pay | Admitting: Internal Medicine

## 2012-07-30 MED ORDER — PREDNISONE 10 MG PO TABS: ORAL_TABLET | ORAL | Status: DC

## 2012-07-30 NOTE — Telephone Encounter (Signed)
Prednisone 10 mg take  4 each am x 2 days,   2 each am x 2 days,  1 each am x 2 days and stop  Ok to use up to 2 puffs q 4 h prn

## 2012-07-30 NOTE — Telephone Encounter (Signed)
Last OV on 05-22-12. Pt is currently in [place] on vacation. He states over the last several day she has been having increased SOB with minimal activity such as walking room to room and this is with using 2 liters oxygen. Pt states he has not been having any congestion, cough, wheezing, fever, only SOB. Pt denies any sob at rest. He states he has not had to use his xopenex in a while but over the last 3 days he has used it once each day to "get through a rough spot." Pt is wanting recs on what to do. Please advise. Carron Curie, CMA Allergies  Allergen Reactions  . Cefdinir     REACTION: diarrhea  . Sulfonamide Derivatives     REACTION: facial/tongue swelling

## 2012-07-30 NOTE — Telephone Encounter (Signed)
Spoke with pt and notified of recs per MW He verbalized understanding and rx was sent to [facility]

## 2012-08-12 ENCOUNTER — Other Ambulatory Visit: Payer: Self-pay | Admitting: Internal Medicine

## 2012-08-13 ENCOUNTER — Telehealth: Payer: Self-pay | Admitting: Neurology

## 2012-08-15 DIAGNOSIS — Z881 Allergy status to other antibiotic agents status: Secondary | ICD-10-CM | POA: Diagnosis not present

## 2012-08-15 DIAGNOSIS — J441 Chronic obstructive pulmonary disease with (acute) exacerbation: Secondary | ICD-10-CM | POA: Diagnosis not present

## 2012-08-15 DIAGNOSIS — R0602 Shortness of breath: Secondary | ICD-10-CM | POA: Diagnosis not present

## 2012-08-15 DIAGNOSIS — R0989 Other specified symptoms and signs involving the circulatory and respiratory systems: Secondary | ICD-10-CM | POA: Diagnosis not present

## 2012-08-15 DIAGNOSIS — Z79899 Other long term (current) drug therapy: Secondary | ICD-10-CM | POA: Diagnosis not present

## 2012-08-15 DIAGNOSIS — R0609 Other forms of dyspnea: Secondary | ICD-10-CM | POA: Diagnosis not present

## 2012-08-15 DIAGNOSIS — Z951 Presence of aortocoronary bypass graft: Secondary | ICD-10-CM | POA: Diagnosis not present

## 2012-08-15 DIAGNOSIS — Z882 Allergy status to sulfonamides status: Secondary | ICD-10-CM | POA: Diagnosis not present

## 2012-08-15 DIAGNOSIS — J449 Chronic obstructive pulmonary disease, unspecified: Secondary | ICD-10-CM | POA: Diagnosis not present

## 2012-08-15 DIAGNOSIS — Z87891 Personal history of nicotine dependence: Secondary | ICD-10-CM | POA: Diagnosis not present

## 2012-08-15 DIAGNOSIS — J069 Acute upper respiratory infection, unspecified: Secondary | ICD-10-CM | POA: Diagnosis not present

## 2012-08-15 DIAGNOSIS — Z7982 Long term (current) use of aspirin: Secondary | ICD-10-CM | POA: Diagnosis not present

## 2012-08-17 ENCOUNTER — Other Ambulatory Visit: Payer: Self-pay | Admitting: Internal Medicine

## 2012-08-22 ENCOUNTER — Ambulatory Visit: Payer: Medicare Other | Admitting: Internal Medicine

## 2012-08-22 DIAGNOSIS — R059 Cough, unspecified: Secondary | ICD-10-CM | POA: Diagnosis not present

## 2012-08-22 DIAGNOSIS — J441 Chronic obstructive pulmonary disease with (acute) exacerbation: Secondary | ICD-10-CM | POA: Diagnosis not present

## 2012-08-22 DIAGNOSIS — R0989 Other specified symptoms and signs involving the circulatory and respiratory systems: Secondary | ICD-10-CM | POA: Diagnosis not present

## 2012-08-22 DIAGNOSIS — R05 Cough: Secondary | ICD-10-CM | POA: Diagnosis not present

## 2012-08-22 DIAGNOSIS — R0609 Other forms of dyspnea: Secondary | ICD-10-CM | POA: Diagnosis not present

## 2012-08-23 DIAGNOSIS — J398 Other specified diseases of upper respiratory tract: Secondary | ICD-10-CM | POA: Diagnosis not present

## 2012-08-23 DIAGNOSIS — J438 Other emphysema: Secondary | ICD-10-CM | POA: Diagnosis not present

## 2012-08-23 DIAGNOSIS — J988 Other specified respiratory disorders: Secondary | ICD-10-CM | POA: Diagnosis not present

## 2012-08-23 DIAGNOSIS — Z87891 Personal history of nicotine dependence: Secondary | ICD-10-CM | POA: Diagnosis not present

## 2012-08-23 DIAGNOSIS — R0602 Shortness of breath: Secondary | ICD-10-CM | POA: Diagnosis not present

## 2012-08-23 DIAGNOSIS — R911 Solitary pulmonary nodule: Secondary | ICD-10-CM | POA: Diagnosis not present

## 2012-09-02 ENCOUNTER — Ambulatory Visit: Payer: Medicare Other | Admitting: Internal Medicine

## 2012-09-11 DIAGNOSIS — R1310 Dysphagia, unspecified: Secondary | ICD-10-CM | POA: Diagnosis not present

## 2012-09-11 DIAGNOSIS — R5381 Other malaise: Secondary | ICD-10-CM | POA: Diagnosis not present

## 2012-09-11 DIAGNOSIS — R5383 Other fatigue: Secondary | ICD-10-CM | POA: Diagnosis not present

## 2012-09-11 DIAGNOSIS — J449 Chronic obstructive pulmonary disease, unspecified: Secondary | ICD-10-CM | POA: Diagnosis not present

## 2012-09-12 DIAGNOSIS — J449 Chronic obstructive pulmonary disease, unspecified: Secondary | ICD-10-CM | POA: Diagnosis not present

## 2012-09-12 DIAGNOSIS — J479 Bronchiectasis, uncomplicated: Secondary | ICD-10-CM | POA: Diagnosis not present

## 2012-09-12 DIAGNOSIS — R05 Cough: Secondary | ICD-10-CM | POA: Diagnosis not present

## 2012-09-12 DIAGNOSIS — F329 Major depressive disorder, single episode, unspecified: Secondary | ICD-10-CM | POA: Diagnosis not present

## 2012-09-12 DIAGNOSIS — R059 Cough, unspecified: Secondary | ICD-10-CM | POA: Diagnosis not present

## 2012-09-12 DIAGNOSIS — R0609 Other forms of dyspnea: Secondary | ICD-10-CM | POA: Diagnosis not present

## 2012-09-12 DIAGNOSIS — J40 Bronchitis, not specified as acute or chronic: Secondary | ICD-10-CM | POA: Diagnosis not present

## 2012-09-12 DIAGNOSIS — R0989 Other specified symptoms and signs involving the circulatory and respiratory systems: Secondary | ICD-10-CM | POA: Diagnosis not present

## 2012-09-12 DIAGNOSIS — R5381 Other malaise: Secondary | ICD-10-CM | POA: Diagnosis not present

## 2012-09-12 DIAGNOSIS — R5383 Other fatigue: Secondary | ICD-10-CM | POA: Diagnosis not present

## 2012-09-13 ENCOUNTER — Ambulatory Visit: Payer: Medicare Other | Admitting: Internal Medicine

## 2012-09-13 DIAGNOSIS — J449 Chronic obstructive pulmonary disease, unspecified: Secondary | ICD-10-CM | POA: Diagnosis not present

## 2012-09-16 ENCOUNTER — Ambulatory Visit: Payer: Medicare Other | Admitting: Neurology

## 2012-09-20 ENCOUNTER — Other Ambulatory Visit: Payer: Self-pay | Admitting: *Deleted

## 2012-09-20 MED ORDER — LEVALBUTEROL HCL 1.25 MG/3ML IN NEBU: INHALATION_SOLUTION | RESPIRATORY_TRACT | Status: DC

## 2012-09-20 NOTE — Telephone Encounter (Signed)
Received faxed refill request from [facility] requesting a 1 month supply of medication.

## 2012-09-24 ENCOUNTER — Ambulatory Visit: Payer: Medicare Other | Admitting: Internal Medicine

## 2012-09-30 ENCOUNTER — Other Ambulatory Visit (HOSPITAL_COMMUNITY): Payer: Self-pay | Admitting: Urology

## 2012-09-30 ENCOUNTER — Ambulatory Visit (INDEPENDENT_AMBULATORY_CARE_PROVIDER_SITE_OTHER): Payer: Medicare Other | Admitting: Internal Medicine

## 2012-09-30 ENCOUNTER — Encounter: Payer: Self-pay | Admitting: Internal Medicine

## 2012-09-30 VITALS — BP 140/90 | HR 76 | Temp 97.0°F | Ht 68.0 in | Wt 146.0 lb

## 2012-09-30 DIAGNOSIS — J449 Chronic obstructive pulmonary disease, unspecified: Secondary | ICD-10-CM | POA: Diagnosis not present

## 2012-09-30 DIAGNOSIS — J9610 Chronic respiratory failure, unspecified whether with hypoxia or hypercapnia: Secondary | ICD-10-CM | POA: Diagnosis not present

## 2012-09-30 DIAGNOSIS — N401 Enlarged prostate with lower urinary tract symptoms: Secondary | ICD-10-CM | POA: Diagnosis not present

## 2012-09-30 DIAGNOSIS — R972 Elevated prostate specific antigen [PSA]: Secondary | ICD-10-CM

## 2012-09-30 DIAGNOSIS — Z23 Encounter for immunization: Secondary | ICD-10-CM

## 2012-09-30 DIAGNOSIS — N402 Nodular prostate without lower urinary tract symptoms: Secondary | ICD-10-CM | POA: Diagnosis not present

## 2012-09-30 MED ORDER — PREDNISONE (PAK) 10 MG PO TABS: ORAL_TABLET | ORAL | Status: DC

## 2012-09-30 NOTE — Progress Notes (Signed)
Spoke with pt and notified of recs per MW He verbalized understanding and rx was sent to pharm  Subjective:    Patient ID: [name], male    DOB: December 18, 1929   MRN: [id number]     Summary: Pulmonary/ f/u ov try astepro for pnds   Primary Provider/Referring Provider: [name]    Brief patient profile:  83   yowm last smoked 1983 severe COPD FEV1 of only 36% April 2007 with a 29% improvement after bronchodilators consistent with an asthmatic component. Baseline activity level--walk 1 mile daily with oxygen on. Maintained on Symbicort 160/4.84mcg 1 puff two times a day.    History of Present Illness  February 11, 2008 ov never took spiriva consistently. No variability to doe or sign cough. rec spiriva but w/in one week cp > [facility] with neg cardiac wu, still having occ cp , positional, better with rubbing chest and relaxing.  Retry spiriva   October 15, 2008 rov follow up, about the same did not get along well with spiriva , discont. taking caused dry mouth. still definitely benefits from as needed xopenex.    February 18, 2010 -- ov cc cough with green/white mucus, wheezing, SOB x5days. OTC not helping. Cough is worse at night, keeping him up at times. Wheezing started yesterday. rex doxy, prednisone then admit 2/14 to [facility] and d/c 2/17 with ct c/w emphysema > d/c on zmax and prednisone tapered off as of March 03, 2010   March 03, 2010 ov cc  minimally improved sob and cough with green sputum and nasal congestion esp in am .. doex > slow adls  minimally better p nebs rec  See Patient Care Coordinator before leaving for sinus CT  Thickening only  Prilosec otc 20mg  30 min before bfast and zantac 150 at bedtime as long as coughing ( reflux is to cough what oxygen is to fire)  Augmentin 875 mg twice daily with water and eat yogurt for lunch  Prednisone 10mg  2 daily back to normal then 1 daily until return.   March 17, 2010 ov Cough and Dyspnea- some better, mucus is now white and less volume  worse lying down, less need for rescue.  rec taper off prednisone >  5 days off  04/28/2010 ov/[name] no worse off prednisone,  Rare astepro, no need for rescue or even using the atrovent/xop more than twice dailly - overall much better.   rec Ipatropium / xopenex are twice regulary with ability to increase to 4 x daily if breathing worsens   10/14/2010 f/u ov/Abid Bolla cc Pt states breathing worse with exertion x 1 month. Still has no cough but lots of mucus esp in am with  No purulent sputum. ent wants to try ipatropium and scopalamine to dry up his drippy nose - no longer following med calendar for action plan at bottom rec Change ipatropium to 4 x daily and just use Only use your xopenex as a rescue medication to be used if you can't catch your breath by resting or doing a relaxed purse lip breathing pattern. The less you use it, the better it will work when you need it.   Prednisone 10 mg take  4 each am x 2 days,   2 each am x 2 days,  1 each am x2days and stop   Please schedule a follow up office visit in 6 weeks, call sooner if needed but bring the medication calendar with you   11/25/2010 f/u ov/[name] cc breathing better, nasal drainage better.minimal  cough. Doe at baselline.minimal use of rescue   rec Follow med calendar  03/10/2011 Follow up and Med review  Returns for follow up and med review. We reviewed his meds and organized them into a med calendar with pt educaiton.   rec  05/01/2011 f/u ov/[name] cc 50 ft outside, one aisle at [employer], whole store with 02. No cough and minimal need for saba  Seen at [facility] rec change ipatropium neb  to atrovent > no change doe. rec ry tudorza one puff twice daily and stop atrovent and see how it effects your activity performance, if not benefit ok to change back to atrovent   10/04/2011 f/u ov/[name] did not bring med calendar cc  Loosing ground on tudorza so went to atrovent neb and seemed to help. rec Plan A = use automatically no matter what Symbicort  followed by Atrovent each am and symbiocort again in 12 hours plus continue the atrovent though the day in between about every 4-6 hours  Only use your albuterol (xopenex hfa -puffer-  is Plan B,  Nebulizer is plan C) as a rescue medication to be used if you can't catch your breath by resting or doing a relaxed purse lip breathing pattern. The less you use it, the better it will work when you need it.      Late add: Struggling a bit with implementation of action plans, need to review med calendar next ov   12/21/2011 f/u ov/[name] cc doe loosing ground = one aisle slow off 02,  Does ok on 02 at grocery, but even on 02 on treadmill at usual grade (flat) and speed (1.4 mph) on 02 x 20 min where used to to 1.5 x 30 min.  qnasal helped nasal discharge but increased bp so stopped it and drainage came back. rec Restart qnasal and monitor your blood pressure  Always wear 02 2lpm at bedtime and when walking more than room to room at home    01/29/2012 f/u ov/[name] cc slt better doe, fought off cold ok x sev weeks prior to OV  and back to rare if ever use xopenex  maint complaint is persistent x years watery rhinitis worse with cold air exp and with eating, no better on qnasal.     No obvious daytime variabilty or assoc chronic cough or cp or chest tightness, subjective wheeze overt sinus or hb symptoms. No unusual exp hx  >chlortrimeton As needed  > did not try   04/05/2012 Acute OV  Complains of  increased SOB, some wheezing and tightness in chest x3 weeks. Patient denies any discolored mucus, or fever, chest pain, orthopnea. Has had increased lower extremity swelling. Has been worse over the last few weeks. Did try Lasix 20 mg daily, but did not see much improvement. Patient denies any calf pain, hemoptysis, or known injury. Has had quite a bit of nasal congestion, and drainage. He denies sinus pain or pressure. rec Lasix 20mg   2 tabs daily for 3 days then As needed  For leg swelling.       05/22/2012 f/u ov/[name] copd/ resp failure on 02 2lpm sleeping and with activity Chief Complaint  Patient presents with  . Follow-up    Breathing improved since the last visit, not back at his baseline yet. No new co's today.   Sense of pnds worst in am never expectorates, better for only 5 min p "does his trick" to clear his throat. Rare use of xopenex hfa daytime, never needs neb saba rec  Take zantac 150 mg and chlortrimeton 4 mg at bedtime  To see what effect if any it has on your am congestion in your throat and nose   09/30/2012 f/u ov/Tekela Garguilo re: copd/02 dep  Chief Complaint  Patient presents with  . Follow-up    Pt states breathing worse for the past 5 wks. He states that he gets SOB and dizzy with minimal exertion.    dizzy ? Since started Zmax at rec of [facility]. Doe x 100 ft even on 02, less cough on rx Confused with action plan meds even though read him med caledar back to me correctly   No obvious daytime variabilty or assoc excess mucus production or cp or chest tightness, subjective wheeze overt sinus or hb symptoms. No unusual exp hx or h/o childhood pna/ asthma or premature birth to his knowledge.   Sleeping ok without nocturnal  or early am exacerbation  of respiratory  c/o's or need for noct saba. Also denies any obvious fluctuation of symptoms with weather or environmental changes or other aggravating or alleviating factors except as outlined above   Current Medications, Allergies, Past Medical History, Past Surgical History, Family History, and Social History were reviewed in [employer] record.  ROS  The following are not active complaints unless bolded sore throat, dysphagia, dental problems, itching, sneezing,  nasal congestion or excess/ purulent secretions, ear ache,   fever, chills, sweats, unintended wt loss, pleuritic or exertional cp, hemoptysis,  orthopnea pnd or leg swelling, presyncope, palpitations, heartburn, abdominal  pain, anorexia, nausea, vomiting, diarrhea  or change in bowel or urinary habits, change in stools or urine, dysuria,hematuria,  rash, arthralgias, visual complaints, headache, numbness weakness or ataxia or problems with walking or coordination,  change in mood/affect or memory.        Past Medical History:  CHRONIC RHINITIS (ICD-472.0)  - Sinus ct March 03, 2010 > thickening only, no acute changes  CHRONIC RESPIRATORY FAILURE (ICD-518.83)  - 02 rx since 2008  COPD UNSPECIFIED (ICD-496)....................................................................[name]  - PFTs 05/04/05 FEV1 36% ratio 28% with 29% improvement after bronchodilators DLCO 48%  - PFT's 03/26/08 30% ratio 34 with 14% improvement after bronchodilators DLC0 38 %  - Start noct 02 at 2lpm 2008 and on 03/04/09 desat @ > 185 ft so rec wear with activtiy > rm to rm  - [facility] June 30, 2008 100% > confirmed March 04, 2009  ISCHEMIC HEART DISEASE (ICD-414.9)  HYPERTENSION (ICD-401.9)  HYPERLIPIDEMIA (ICD-272.4)  SPN RUL by cxr August 19, 2009  COMPLEX MED REGIMEN-Meds reviewed with pt education and computerized med calendar 11/22/09 03/10/2011                            Objective:   Physical Exam  amb wm nad with nl vitals  wt 159 04/28/2010  > 10/14/2010 154  > 10/04/2011 150> 12/21/2011  150 > 149 01/29/2012 >154 04/05/2012 > 05/22/2012 148 > 09/30/2012  146  HEENT mild turbinate edema.  Oropharynx no thrush or excess pnd or cobblestoning.  No JVD or cervical adenopathy. Mild accessory muscle hypertrophy. Trachea midline, nl thryroid. Diminished BS in bases, no wheezing Regular rate and rhythm without murmur gallop or rub , 1+edema .  Abd: no hsm, nl excursion. Ext warm without cyanosis or clubbing.       04/08/12 cxr  No active disease. No significant change. Mild hyperinflation.    Assessment & Plan:

## 2012-09-30 NOTE — Patient Instructions (Addendum)
Prednisone 10 mg take  4 each am x 2 days,   2 each am x 2 days,  1 each am x 2 days and stop   Stop zithromax  Plan A symbiocort and Ipatropium  Plan B = backup > xopenex 2 every 4 if needed  Plan C = xopenex neb every 4 hours if needed  See Tammy NP w/in 2 weeks with all your medications, even over the counter meds, separated in two separate bags, the ones you take no matter what vs the ones you stop once you feel better and take only as needed when you feel you need them.   Tammy  will generate for you a new user friendly medication calendar that will put [place] all on the same page re: your medication use.     Without this process, it simply isn't possible to assure that we are providing  your outpatient care  with  the attention to detail we feel you deserve.   If we cannot assure that you're getting that kind of care,  then we cannot manage your problem effectively from this clinic.  Once you have seen Tammy and we are sure that we're all on the same page with your medication use she will arrange follow up with me.

## 2012-10-01 ENCOUNTER — Telehealth: Payer: Self-pay | Admitting: Internal Medicine

## 2012-10-01 NOTE — Telephone Encounter (Signed)
Pt seen yesterday and was given pred taper. Yesterday pt was c/o having dizziness and sob. Today he called stating the SOB is not worse but the dizziness and fatigue. I advised that he needs to continue the pred taper since SOB is not worse and to contact his PCP because other symptoms may be due to other issues. He states he has an appt with them tomorrow morning. Carron Curie, CMA

## 2012-10-01 NOTE — Assessment & Plan Note (Signed)
-   PFTs 05/04/05 FEV1 36% ratio 28% with 29% improvement after bronchodilators DLCO 48%  - PFT's 03/26/08 30% ratio 34 with 14% improvement after bronchodilators DLC0 38 %  - Start noct 02 at 2lpm 2008 and on 03/04/09 desat @ > 185 ft so rec wear with activtiy > rm to rm  - HFA 90% 05/01/11 - Trial of tudorza 05/02/2011 > no better, changed back to atrovent neb    Unfortunately no much reversible here, worse on zmax ?dizzy from it    Each maintenance medication was reviewed in detail including most importantly the difference between maintenance and as needed and under what circumstances the prns are to be used. This was done in the context of a medication calendar review which provided the patient with a user-friendly unambiguous mechanism for medication administration and reconciliation and provides an action plan for all active problems. It is critical that this be shown to every doctor  for modification during the office visit if necessary so the patient can use it as a working document.

## 2012-10-01 NOTE — Assessment & Plan Note (Addendum)
-   02/15/2011   Walked RA x one lap @ 185 stopped due to  desat > corrected on 2lpm   05/23/2012  rx =  02 2lpm at hs and with activity > room to room  Adequate control on present rx, reviewed > no change in rx needed

## 2012-10-02 ENCOUNTER — Ambulatory Visit: Payer: Medicare Other | Admitting: Neurology

## 2012-10-02 DIAGNOSIS — J309 Allergic rhinitis, unspecified: Secondary | ICD-10-CM | POA: Diagnosis not present

## 2012-10-02 DIAGNOSIS — R5381 Other malaise: Secondary | ICD-10-CM | POA: Diagnosis not present

## 2012-10-02 DIAGNOSIS — R42 Dizziness and giddiness: Secondary | ICD-10-CM | POA: Diagnosis not present

## 2012-10-02 DIAGNOSIS — R5383 Other fatigue: Secondary | ICD-10-CM | POA: Diagnosis not present

## 2012-10-02 DIAGNOSIS — J449 Chronic obstructive pulmonary disease, unspecified: Secondary | ICD-10-CM | POA: Diagnosis not present

## 2012-10-02 DIAGNOSIS — R002 Palpitations: Secondary | ICD-10-CM | POA: Diagnosis not present

## 2012-10-09 ENCOUNTER — Ambulatory Visit (HOSPITAL_COMMUNITY): Admission: RE | Admit: 2012-10-09 | Payer: Managed Care, Other (non HMO) | Source: Ambulatory Visit

## 2012-10-09 HISTORY — PX: PROSTATE BIOPSY: SHX241

## 2012-10-15 ENCOUNTER — Encounter: Payer: Self-pay | Admitting: Adult Health

## 2012-10-15 ENCOUNTER — Ambulatory Visit (INDEPENDENT_AMBULATORY_CARE_PROVIDER_SITE_OTHER): Payer: Medicare Other | Admitting: Adult Health

## 2012-10-15 VITALS — BP 144/74 | HR 74 | Temp 97.4°F | Ht 68.0 in | Wt 144.6 lb

## 2012-10-15 DIAGNOSIS — J310 Chronic rhinitis: Secondary | ICD-10-CM | POA: Diagnosis not present

## 2012-10-15 DIAGNOSIS — J449 Chronic obstructive pulmonary disease, unspecified: Secondary | ICD-10-CM | POA: Diagnosis not present

## 2012-10-15 DIAGNOSIS — J4489 Other specified chronic obstructive pulmonary disease: Secondary | ICD-10-CM

## 2012-10-15 NOTE — Progress Notes (Signed)
Spoke with pt and notified of recs per MW He verbalized understanding and rx was sent to pharm  Subjective:    Patient ID: [name], male    DOB: December 18, 1929   MRN: [id number]     Summary: Pulmonary/ f/u ov try astepro for pnds   Primary Provider/Referring Provider: [name]    Brief patient profile:  83   yowm last smoked 1983 severe COPD FEV1 of only 36% April 2007 with a 29% improvement after bronchodilators consistent with an asthmatic component. Baseline activity level--walk 1 mile daily with oxygen on. Maintained on Symbicort 160/4.84mcg 1 puff two times a day.    History of Present Illness  February 11, 2008 ov never took spiriva consistently. No variability to doe or sign cough. rec spiriva but w/in one week cp > [facility] with neg cardiac wu, still having occ cp , positional, better with rubbing chest and relaxing.  Retry spiriva   October 15, 2008 rov follow up, about the same did not get along well with spiriva , discont. taking caused dry mouth. still definitely benefits from as needed xopenex.    February 18, 2010 -- ov cc cough with green/white mucus, wheezing, SOB x5days. OTC not helping. Cough is worse at night, keeping him up at times. Wheezing started yesterday. rex doxy, prednisone then admit 2/14 to [facility] and d/c 2/17 with ct c/w emphysema > d/c on zmax and prednisone tapered off as of March 03, 2010   March 03, 2010 ov cc  minimally improved sob and cough with green sputum and nasal congestion esp in am .. doex > slow adls  minimally better p nebs rec  See Patient Care Coordinator before leaving for sinus CT  Thickening only  Prilosec otc 20mg  30 min before bfast and zantac 150 at bedtime as long as coughing ( reflux is to cough what oxygen is to fire)  Augmentin 875 mg twice daily with water and eat yogurt for lunch  Prednisone 10mg  2 daily back to normal then 1 daily until return.   March 17, 2010 ov Cough and Dyspnea- some better, mucus is now white and less volume  worse lying down, less need for rescue.  rec taper off prednisone >  5 days off  04/28/2010 ov/Wert no worse off prednisone,  Rare astepro, no need for rescue or even using the atrovent/xop more than twice dailly - overall much better.   rec Ipatropium / xopenex are twice regulary with ability to increase to 4 x daily if breathing worsens   10/14/2010 f/u ov/Wert cc Pt states breathing worse with exertion x 1 month. Still has no cough but lots of mucus esp in am with  No purulent sputum. ent wants to try ipatropium and scopalamine to dry up his drippy nose - no longer following med calendar for action plan at bottom rec Change ipatropium to 4 x daily and just use Only use your xopenex as a rescue medication to be used if you can't catch your breath by resting or doing a relaxed purse lip breathing pattern. The less you use it, the better it will work when you need it.   Prednisone 10 mg take  4 each am x 2 days,   2 each am x 2 days,  1 each am x2days and stop   Please schedule a follow up office visit in 6 weeks, call sooner if needed but bring the medication calendar with you   11/25/2010 f/u ov/Wert cc breathing better, nasal drainage better.minimal  cough. Doe at baselline.minimal use of rescue   rec Follow med calendar  03/10/2011 Follow up and Med review  Returns for follow up and med review. We reviewed his meds and organized them into a med calendar with pt educaiton.   rec  05/01/2011 f/u ov/Wert cc 50 ft outside, one aisle at [employer], whole store with 02. No cough and minimal need for saba  Seen at [facility] rec change ipatropium neb  to atrovent > no change doe. rec ry tudorza one puff twice daily and stop atrovent and see how it effects your activity performance, if not benefit ok to change back to atrovent   10/04/2011 f/u ov/Wert did not bring med calendar cc  Loosing ground on tudorza so went to atrovent neb and seemed to help. rec Plan A = use automatically no matter what Symbicort  followed by Atrovent each am and symbiocort again in 12 hours plus continue the atrovent though the day in between about every 4-6 hours  Only use your albuterol (xopenex hfa -puffer-  is Plan B,  Nebulizer is plan C) as a rescue medication to be used if you can't catch your breath by resting or doing a relaxed purse lip breathing pattern. The less you use it, the better it will work when you need it.      Late add: Struggling a bit with implementation of action plans, need to review med calendar next ov   12/21/2011 f/u ov/Wert cc doe loosing ground = one aisle slow off 02,  Does ok on 02 at grocery, but even on 02 on treadmill at usual grade (flat) and speed (1.4 mph) on 02 x 20 min where used to to 1.5 x 30 min.  qnasal helped nasal discharge but increased bp so stopped it and drainage came back. rec Restart qnasal and monitor your blood pressure  Always wear 02 2lpm at bedtime and when walking more than room to room at home    01/29/2012 f/u ov/Wert cc slt better doe, fought off cold ok x sev weeks prior to OV  and back to rare if ever use xopenex  maint complaint is persistent x years watery rhinitis worse with cold air exp and with eating, no better on qnasal.     No obvious daytime variabilty or assoc chronic cough or cp or chest tightness, subjective wheeze overt sinus or hb symptoms. No unusual exp hx  >chlortrimeton As needed  > did not try   04/05/2012 Acute OV  Complains of  increased SOB, some wheezing and tightness in chest x3 weeks. Patient denies any discolored mucus, or fever, chest pain, orthopnea. Has had increased lower extremity swelling. Has been worse over the last few weeks. Did try Lasix 20 mg daily, but did not see much improvement. Patient denies any calf pain, hemoptysis, or known injury. Has had quite a bit of nasal congestion, and drainage. He denies sinus pain or pressure. rec Lasix 20mg   2 tabs daily for 3 days then As needed  For leg swelling.       05/22/2012 f/u ov/Wert copd/ resp failure on 02 2lpm sleeping and with activity Chief Complaint  Patient presents with  . Follow-up    Breathing improved since the last visit, not back at his baseline yet. No new co's today.   Sense of pnds worst in am never expectorates, better for only 5 min p "does his trick" to clear his throat. Rare use of xopenex hfa daytime, never needs neb saba rec  Take zantac 150 mg and chlortrimeton 4 mg at bedtime  To see what effect if any it has on your am congestion in your throat and nose   09/30/2012 f/u ov/Wert re: copd/02 dep  Chief Complaint  Patient presents with  . Follow-up    Pt states breathing worse for the past 5 wks. He states that he gets SOB and dizzy with minimal exertion.    dizzy ? Since started Zmax at rec of [facility]. Doe x 100 ft even on 02, less cough on rx Confused with action plan meds even though read him med caledar back to me correctly   >>pred taper   10/15/2012 Follow up and Med review  Patient returns for a followup and medication review. Reviewed all his medications and organized them into a medication calendar with patient education. Appears the patient is taking his medications correctly. Patient requests a referral to allergist. Today. He complains of persistent nasal congestion, and drainage. We discussed using saline nasal rinses and saline gel to help with his nasal dryness. Due to oxygen. Use. Patient denies any hemoptysis, orthopnea, PND, or leg swelling. Last visit. Patient was having a flare. COPD, was treated with a prednisone taper, which is now resolved. Patient's breathing is back to baseline.  Current Medications, Allergies, Past Medical History, Past Surgical History, Family History, and Social History were reviewed in [employer] record.  ROS  The following are not active complaints unless bolded sore throat, dysphagia, dental problems, itching, sneezing,  nasal  congestion or excess/ purulent secretions, ear ache,   fever, chills, sweats, unintended wt loss, pleuritic or exertional cp, hemoptysis,  orthopnea pnd or leg swelling, presyncope, palpitations, heartburn, abdominal pain, anorexia, nausea, vomiting, diarrhea  or change in bowel or urinary habits, change in stools or urine, dysuria,hematuria,  rash, arthralgias, visual complaints, headache, numbness weakness or ataxia or problems with walking or coordination,  change in mood/affect or memory.        Past Medical History:  CHRONIC RHINITIS (ICD-472.0)  - Sinus ct March 03, 2010 > thickening only, no acute changes  CHRONIC RESPIRATORY FAILURE (ICD-518.83)  - 02 rx since 2008  COPD UNSPECIFIED (ICD-496)....................................................................[name]  - PFTs 05/04/05 FEV1 36% ratio 28% with 29% improvement after bronchodilators DLCO 48%  - PFT's 03/26/08 30% ratio 34 with 14% improvement after bronchodilators DLC0 38 %  - Start noct 02 at 2lpm 2008 and on 03/04/09 desat @ > 185 ft so rec wear with activtiy > rm to rm  - [facility] June 30, 2008 100% > confirmed March 04, 2009  ISCHEMIC HEART DISEASE (ICD-414.9)  HYPERTENSION (ICD-401.9)  HYPERLIPIDEMIA (ICD-272.4)  SPN RUL by cxr August 19, 2009  COMPLEX MED REGIMEN-Meds reviewed with pt education and computerized med calendar 11/22/09 03/10/2011  , 10/15/2012                           Objective:   Physical Exam  amb wm nad with nl vitals  wt 159 04/28/2010  > 10/14/2010 154  > 10/04/2011 150> 12/21/2011  150 > 149 01/29/2012 >154 04/05/2012 > 05/22/2012 148 > 09/30/2012  146 >>144 10/15/2012  HEENT mild turbinate edema.  Oropharynx no thrush or excess pnd or cobblestoning.  No JVD or cervical adenopathy. Mild accessory muscle hypertrophy. Trachea midline, nl thryroid. Diminished BS in bases, no wheezing Regular rate and rhythm without murmur gallop or rub , tr+edema .  Abd: no hsm, nl excursion. Ext warm without  cyanosis or clubbing.       04/08/12 cxr  No active disease. No significant change. Mild hyperinflation.    Assessment & Plan:

## 2012-10-15 NOTE — Assessment & Plan Note (Signed)
Recent flare now resolved Patient's medications were reviewed today and patient education was given. Computerized medication calendar was adjusted/completed   Plan  Follow med calendar closely and bring to each visit.  Continue on current regimen.  May use saline nasal spray and gel As needed  For congestion .   Please contact office for sooner follow up if symptoms do not improve or worsen or seek emergency care   follow up Dr. Sherene Sires  In 8 weeks and As needed

## 2012-10-15 NOTE — Patient Instructions (Addendum)
Follow med calendar closely and bring to each visit.  Continue on current regimen.  May use saline nasal spray and gel As needed  For congestion .   Please contact office for sooner follow up if symptoms do not improve or worsen or seek emergency care   follow up Dr. Sherene Sires  In 8 weeks and As needed

## 2012-10-16 DIAGNOSIS — H43819 Vitreous degeneration, unspecified eye: Secondary | ICD-10-CM | POA: Diagnosis not present

## 2012-10-16 DIAGNOSIS — R42 Dizziness and giddiness: Secondary | ICD-10-CM | POA: Diagnosis not present

## 2012-10-16 DIAGNOSIS — H04129 Dry eye syndrome of unspecified lacrimal gland: Secondary | ICD-10-CM | POA: Diagnosis not present

## 2012-10-16 DIAGNOSIS — H2589 Other age-related cataract: Secondary | ICD-10-CM | POA: Diagnosis not present

## 2012-10-16 DIAGNOSIS — H53149 Visual discomfort, unspecified: Secondary | ICD-10-CM | POA: Diagnosis not present

## 2012-10-16 DIAGNOSIS — H5330 Unspecified disorder of binocular vision: Secondary | ICD-10-CM | POA: Diagnosis not present

## 2012-10-16 DIAGNOSIS — Z961 Presence of intraocular lens: Secondary | ICD-10-CM | POA: Diagnosis not present

## 2012-10-18 ENCOUNTER — Other Ambulatory Visit: Payer: Self-pay | Admitting: Internal Medicine

## 2012-10-19 ENCOUNTER — Other Ambulatory Visit: Payer: Self-pay | Admitting: Internal Medicine

## 2012-10-22 DIAGNOSIS — K3189 Other diseases of stomach and duodenum: Secondary | ICD-10-CM | POA: Diagnosis not present

## 2012-10-22 DIAGNOSIS — I2510 Atherosclerotic heart disease of native coronary artery without angina pectoris: Secondary | ICD-10-CM | POA: Diagnosis not present

## 2012-10-22 DIAGNOSIS — R1011 Right upper quadrant pain: Secondary | ICD-10-CM | POA: Diagnosis not present

## 2012-10-23 ENCOUNTER — Other Ambulatory Visit: Payer: Self-pay | Admitting: Family Medicine

## 2012-10-23 ENCOUNTER — Telehealth: Payer: Self-pay

## 2012-10-23 DIAGNOSIS — R1011 Right upper quadrant pain: Secondary | ICD-10-CM

## 2012-10-24 MED ORDER — METOPROLOL SUCCINATE ER 25 MG PO TB24: 25.0000 mg | ORAL_TABLET | Freq: Every day | ORAL | Status: DC

## 2012-10-24 NOTE — Telephone Encounter (Signed)
Done

## 2012-10-25 ENCOUNTER — Ambulatory Visit
Admission: RE | Admit: 2012-10-25 | Discharge: 2012-10-25 | Disposition: A | Discharge status: Home / Self Care | Payer: Medicare Other | Source: Ambulatory Visit | Attending: Family Medicine | Admitting: Family Medicine

## 2012-10-25 DIAGNOSIS — R9389 Abnormal findings on diagnostic imaging of other specified body structures: Secondary | ICD-10-CM | POA: Diagnosis not present

## 2012-10-25 DIAGNOSIS — R1011 Right upper quadrant pain: Secondary | ICD-10-CM

## 2012-10-28 ENCOUNTER — Encounter: Payer: Self-pay | Admitting: Cardiology

## 2012-10-28 DIAGNOSIS — R5383 Other fatigue: Secondary | ICD-10-CM | POA: Insufficient documentation

## 2012-10-28 DIAGNOSIS — I739 Peripheral vascular disease, unspecified: Secondary | ICD-10-CM | POA: Diagnosis not present

## 2012-10-28 DIAGNOSIS — I25709 Atherosclerosis of coronary artery bypass graft(s), unspecified, with unspecified angina pectoris: Secondary | ICD-10-CM | POA: Insufficient documentation

## 2012-10-28 DIAGNOSIS — I10 Essential (primary) hypertension: Secondary | ICD-10-CM | POA: Diagnosis not present

## 2012-10-28 DIAGNOSIS — R0602 Shortness of breath: Secondary | ICD-10-CM | POA: Diagnosis not present

## 2012-10-28 DIAGNOSIS — J441 Chronic obstructive pulmonary disease with (acute) exacerbation: Secondary | ICD-10-CM | POA: Diagnosis not present

## 2012-10-28 DIAGNOSIS — I2510 Atherosclerotic heart disease of native coronary artery without angina pectoris: Secondary | ICD-10-CM | POA: Diagnosis not present

## 2012-10-28 DIAGNOSIS — E785 Hyperlipidemia, unspecified: Secondary | ICD-10-CM | POA: Diagnosis not present

## 2012-10-28 NOTE — Progress Notes (Signed)
Patient ID: [name], male   DOB: December 22, 1929, 79 y.o.   MRN: [id number]   [name], [name]    Date of visit:  10/28/2012 DOB:  Aug 26, 1929    Age:  79 yrs. Medical record number:  [id number]   Account number:  [id number] Primary Care Provider: ROSS, C. ALAN ____________________________ CURRENT DIAGNOSES  1. Fatigue/malaise  2. Dyspnea  3. CAD,Native  4. Hypertension,Essential (Benign)  5. Hyperlipidemia  6. Peripheral Vascular Disease  7. COPD ____________________________ ALLERGIES  Cefdinir, Intolerance-unknown  Sulfa (Sulfonamides), Intolerance-unknown ____________________________ MEDICATIONS  1. aspirin 325 mg tablet, 1 p.o. daily  2. atorvastatin 20 mg tablet, 1 p.o. daily  3. Vitamin D3 1,000 unit capsule, 1 p.o. daily  4. furosemide 20 mg tablet, PRN  5. guaifenesin 600 mg tablet,extended release biphasic 12 hr, PRN  6. Atrovent HFA 17 mcg/actuation aerosol inhaler, prn  7. Xopenex HFA 45 mcg/actuation aerosol inhaler, PRN  8. metoprolol succinate ER 25 mg tablet,extended release 24 hr, 1 p.o. daily  9. Nasonex 50 mcg/actuation Spray, prn  10. multivitamin tablet, 1 p.o. daily  11. ranitidine 150 mg tablet, bid prn  12. Symbicort 160 mcg-4.5 mcg/actuation HFA aerosol inhaler, 2 puff BID ____________________________ CHIEF COMPLAINTS  Exhaustion ____________________________ HISTORY OF PRESENT ILLNESS  This very nice 79 year old male is seen at the request of Dr. Tenny Craw for evaluation of excessive fatigue as well as worsening dyspnea. The patient has a heart history of coronary artery disease and because of worsening angina and bypass grafting for severe three-vessel coronary disease in 2003. He has not had angina since then but has had significant COPD and has to wear oxygen now. Less than a year ago he was in reasonably good health was able to travel and do normal activities but then has developed progressive fatigue since then. He now has become severely short of breath than 10  not do his normal activities and has complained of severe fatigue. He has some left upper chest discomfort that occurs when he is nervous that is relieved if he takes and shakes his chest with his finger. It is not necessarily exertionally related. He does not have PND or orthopnea. He has had some peripheral edema and does have varicose veins. He evidently had some episodes of dizziness as well as low blood pressure and one of his blood pressure medicines was discontinued that helped to stop this. He evidently had tried to contact Dr. Katrinka Blazing to be seen by him but was not able to get an appointment with him for evaluation and an appointment was made here. He later discovered that he had an appointment for his annual physical with Dr. Katrinka Blazing in about 2 days. The patient denies exertional angina. He does not have significant palpitations. He attributes his illness when he developed an upper respiratory infection up in a visit to the [place] when he became ill and had to take several courses of antibiotics. ____________________________ PAST HISTORY  Past Medical Illnesses:  hypertension, hyperlipidemia, COPD, gait disorder, chronic venous insufficiency, kidney stones;  Cardiovascular Illnesses:  CAD, Carotid artery disease, peripheral vascular disease;  Surgical Procedures:  CABG, cataract extraction, lithotripsy;  Cardiology Procedures-Invasive:  cardiac cath (left), CABG with LIMA to LAD, SVG to OM1-2, SVG to RCA and PDA 09/13/2001 Dr. Tyrone Sage;  Cardiology Procedures-Noninvasive:  treadmill, adenosine cardiolite;  Cardiac Cath Results:  95% stenosis LAD, 80% stenosis mid LAD, 90% stenosis proximal OM 1 OM 2, 95% stenosis mid RCA, 90% stenosis proximal PDA;  LVEF not documented,   ____________________________  CARDIO-PULMONARY TEST DATES EKG Date:  10/28/2012;  CABG: 09/13/2001;  Chest Xray Date: 04/05/2012;   ____________________________ FAMILY HISTORY Brother -- Brother dead, CVA Brother -- Coronary Artery  Disease, Hypertension Father -- Myocardial infarction, Father dead Mother -- Mother dead, Coronary Artery Disease, Hypertension ____________________________ SOCIAL HISTORY Alcohol Use:  no alcohol use;  Smoking:  used to smoke but quit 1983;  Diet:  regular diet;  Lifestyle:  married;  Exercise:  walking for approximately 15 minutes 4 days per week;  Occupation:  retired, Forensic psychologist guardian;  Residence:  lives with wife;   ____________________________ REVIEW OF SYSTEMS General:  weight loss of approximately 10 lbs, malaise and fatigue  Integumentary:no rashes or new skin lesions. Eyes: wears eye glasses/contact lenses, cataracts, cataract extraction Ears, Nose, Throat, Mouth:  denies any hearing loss, epistaxis, hoarseness or difficulty speaking. Respiratory: denies dyspnea, cough, wheezing or hemoptysis. Cardiovascular:  please review HPI Abdominal: dyspepsia Genitourinary-Male: frequency, nocturia  Musculoskeletal:  denies arthritis, venous insufficiency, or muscle weakness. Neurological:  denies headaches, stroke, or TIA,. Psychiatric:  denies depession or anxiety.  ____________________________ PHYSICAL EXAMINATION VITAL SIGNS  Blood Pressure:  154/60 Sitting, Left arm, regular cuff  , 146/60 Standing, Left arm and regular cuff   Pulse:  100/min. Weight:  144.00 lbs. Height:  68"BMI: 22  Constitutional:  pleasant white male in no acute distress wearing oxygen Skin:  warm and dry to touch, no apparent skin lesions, or masses noted. Head:  normocephalic, normal hair pattern, no masses or tenderness Eyes:  EOMS Intact, PERRLA, C and S clear, Funduscopic exam not done. ENT:  ears, nose and throat reveal no gross abnormalities.  Dentition good. Neck:  supple, without massess. No JVD, thyromegaly or carotid bruits. Carotid upstroke normal. Chest:  clear to auscaltation, healed median sternotomy scar Cardiac:  regular rhythm, normal S1 and S2, No S3 or S4, no murmurs, gallops or rubs  detected. Abdomen:  abdomen soft,non-tender, no masses, no hepatospenomegaly, or aneurysm noted Peripheral Pulses:  femoral pulse(s) 2+, pulses below the femoral arteries are diminished, bilateral femoral bruits present Extremities & Back:  well healed saphenous vein donor site RLE, bilateral lower extremity venous varicosities, 1+ edema Neurological:  no gross motor or sensory deficits noted, affect appropriate, oriented x3 ____________________________ IMPRESSIONS/PLAN  1. Progressive fatigue and malaise of uncertain cause 2. Dyspnea on exertion likely multifactorial with recent normal BNP 3. Coronary artery disease with previous bypass grafting 4. COPD with oxygen dependency 5. Hypertension 6. Hyperlipidemia 7. Chronic venous insufficiency with varicose veins  Recommendations:  Some labs from Dr. Charlott Rakes office was reviewed.  At this time he has seen Dr. Katrinka Blazing for over 11 years and is due to see him for an appointment in 2 days. His major frustration I think was with not been able to see Dr. Katrinka Blazing recently when he had some issues with fatigue, dyspnea and some orthostasis. I told him that I would be happy to do his cardiology care but suggested that he first keep his appointment with Dr. Katrinka Blazing and discuss the recent events with him as he has been satisfied with his care previously. I think his fatigue is likely multifactorial and at the minimum I think he should have a echocardiogram to assess his right heart function especially since he has some edema and he may or may not need to have a nuclear stress test to be sure his grafts are not causing ischemia.  What may be happening here may simply be the effects of aging and progression  of his underlying pulmonary disease.  In terms of his chronic venous insufficiency I recommended the use of support stockings, horse chestnut seed extract and to continue furosemide as needed. I told him that I would be happy to do the workup if he desired and he  was grateful for this but he will keep his appointment with Dr. Katrinka Blazing in 2 days and take things from there. ____________________________ TODAYS ORDERS  1. 12 Lead EKG: Today                       ____________________________ Cardiology Physician:  Darden Palmer MD [facility]

## 2012-10-30 ENCOUNTER — Ambulatory Visit (INDEPENDENT_AMBULATORY_CARE_PROVIDER_SITE_OTHER): Payer: Medicare Other | Admitting: Interventional Cardiology

## 2012-10-30 ENCOUNTER — Encounter: Payer: Self-pay | Admitting: Interventional Cardiology

## 2012-10-30 VITALS — BP 160/70 | HR 79 | Wt 145.0 lb

## 2012-10-30 DIAGNOSIS — G4733 Obstructive sleep apnea (adult) (pediatric): Secondary | ICD-10-CM

## 2012-10-30 DIAGNOSIS — J9610 Chronic respiratory failure, unspecified whether with hypoxia or hypercapnia: Secondary | ICD-10-CM

## 2012-10-30 DIAGNOSIS — I2510 Atherosclerotic heart disease of native coronary artery without angina pectoris: Secondary | ICD-10-CM | POA: Diagnosis not present

## 2012-10-30 DIAGNOSIS — I10 Essential (primary) hypertension: Secondary | ICD-10-CM | POA: Diagnosis not present

## 2012-10-30 NOTE — Progress Notes (Signed)
Patient ID: [name], male   DOB: [birth year], 79 y.o.   MRN: [id number]    [address], [address] [place], [place]  [address] Phone: [phone] Fax:  [phone]  Date:  10/30/2012   ID:  [name], DOB 1929/11/13, MRN [id number]  PCP:   Duane Lope, MD   ASSESSMENT:  1. Dizzy and lethargic , most likely multifactorial related to intracranial cerebrovascular disease, hypoxia, and age.  2.Dyspnea, multifactorial with COPD being the major contributor  3. CAD without angina  PLAN:  1. No specific cardiac evaluation is needed    SUBJECTIVE: [name] is a 79 y.o. male who has significant comorbidities of COPD (O2 requiring) and diabetes coronary and cerebrovascular atherosclerosis. His complaints are a vaguely of lethargy, weakness, weight loss, and a "foggy" sensorium. This feeling is worse in the morning than later in the day. There are no exertional complaints other than dyspnea. This is not significantly changed. He has not needed nitroglycerin. He has had no focal neurological complaints. He does have bilateral lower extremity edema slightly worse now than previous. There is no orthopnea or PND. He denies palpitations.   Wt Readings from Last 3 Encounters:  10/30/12 145 lb (65.772 kg)  10/15/12 144 lb 9.6 oz (65.59 kg)  09/30/12 146 lb (66.225 kg)     Past Medical History  Diagnosis Date  . Hypertension   . S/P CABG x 4   . CAD (coronary artery disease)   . COPD (chronic obstructive pulmonary disease)   . Hyperlipidemia   . Chronic rhinitis   . Chronic respiratory failure   . Peripheral vascular disease     Current Outpatient Prescriptions  Medication Sig Dispense Refill  . Aspirin Buf,AlHyd-MgHyd-CaCar, (ASCRIPTIN) 325 MG TABS Take 1 tablet by mouth daily.        Marland Kitchen atorvastatin (LIPITOR) 20 MG tablet Take 20 mg by mouth at bedtime.        . ATROVENT HFA 17 MCG/ACT inhaler INHALE 2 PUFFS BY MOUTH FOUR TIMES DAILY AS NEEDED FOR WHEEZING  12.9 g  1  .  Cholecalciferol (VITAMIN D3) 1000 UNITS CAPS Take 1 capsule (1,000 Units total) by mouth daily.      . furosemide (LASIX) 20 MG tablet Take 1 tablet (20 mg total) by mouth daily as needed.  30 tablet  3  . guaiFENesin (MUCINEX) 600 MG 12 hr tablet 1-2 tablets every 12 hours as needed        . ipratropium (ATROVENT) 0.02 % nebulizer solution INHALE 1 VIAL VIA NEBULIZER TWICE DAILY  180 mL  5  . levalbuterol (XOPENEX) 1.25 MG/3ML nebulizer solution Take 1.25 mg by nebulization as needed.      . loratadine (CLARITIN) 10 MG tablet Take 10 mg by mouth daily as needed.        . metoprolol succinate (TOPROL-XL) 25 MG 24 hr tablet Take 1 tablet (25 mg total) by mouth daily. 1 tab daily  30 tablet  3  . Multiple Vitamin (MULTIVITAMIN) tablet Take 1 tablet by mouth daily.        Marland Kitchen NASONEX 50 MCG/ACT nasal spray INSTILL 2 SPRAYS IN EACH NOSTRIL DAILY AS NEEDED  17 g  5  . ranitidine (ZANTAC) 150 MG tablet Take 150 mg by mouth 2 (two) times daily as needed.      . SYMBICORT 160-4.5 MCG/ACT inhaler INHALE 2 PUFFS BY MOUTH TWICE DAILY  10.2 g  11   No current facility-administered medications for  this visit.    Allergies:    Allergies  Allergen Reactions  . Cefdinir     REACTION: diarrhea  . Sulfonamide Derivatives     REACTION: facial/tongue swelling    Social History:  The patient  reports that he quit smoking about 31 years ago. His smoking use included Cigarettes. He has a 66 pack-year smoking history. He has never used smokeless tobacco. He reports that he drinks alcohol. He reports that he does not use illicit drugs.   ROS:  Please see the history of present illness.   Loosing weight and appetite is good.   All other systems reviewed and negative.   OBJECTIVE: VS:  BP 160/70  Pulse 79  Wt 145 lb (65.772 kg)  BMI 22.05 kg/m2 Well nourished, well developed, in no acute distress HEENT: normal Neck: JVD flat with the patient at 30. Carotid bruit absent  Cardiac:  normal S1, S2; RRR; no  murmur Lungs:  clear to auscultation bilaterally, no wheezing, rhonchi or rales Abd: soft, nontender, no hepatomegaly Ext: Edema 2+ bilateral ankle. Pulses palpable Skin: warm and dry Neuro:  CNs 2-12 intact, no focal abnormalities noted  EKG:  NSR with RAD and NSSTA       Signed, Darci Needle III, MD 10/30/2012 2:45 PM

## 2012-10-30 NOTE — Patient Instructions (Signed)
Your physician recommends that you continue on your current medications as directed. Please refer to the Current Medication list given to you today.  Your physician wants you to follow-up in: 1 year. You will receive a reminder letter in the mail two months in advance. If you don't receive a letter, please call our office to schedule the follow-up appointment.  

## 2012-11-04 ENCOUNTER — Encounter: Payer: Self-pay | Admitting: Neurology

## 2012-11-04 ENCOUNTER — Ambulatory Visit (INDEPENDENT_AMBULATORY_CARE_PROVIDER_SITE_OTHER): Payer: Medicare Other | Admitting: Neurology

## 2012-11-04 VITALS — BP 173/80 | HR 80 | Temp 97.7°F | Ht 68.0 in | Wt 144.0 lb

## 2012-11-04 DIAGNOSIS — F329 Major depressive disorder, single episode, unspecified: Secondary | ICD-10-CM | POA: Diagnosis not present

## 2012-11-04 DIAGNOSIS — I951 Orthostatic hypotension: Secondary | ICD-10-CM

## 2012-11-04 DIAGNOSIS — R5381 Other malaise: Secondary | ICD-10-CM

## 2012-11-04 DIAGNOSIS — R55 Syncope and collapse: Secondary | ICD-10-CM | POA: Diagnosis not present

## 2012-11-04 DIAGNOSIS — R269 Unspecified abnormalities of gait and mobility: Secondary | ICD-10-CM | POA: Diagnosis not present

## 2012-11-04 DIAGNOSIS — F32A Depression, unspecified: Secondary | ICD-10-CM

## 2012-11-04 DIAGNOSIS — R5383 Other fatigue: Secondary | ICD-10-CM

## 2012-11-04 NOTE — Progress Notes (Signed)
Subjective:    Patient ID: [name] is a 79 y.o. male.  HPI  Interim history:   [name] is a very pleasant 79 year old right-handed gentleman with an underlying medical history of heart disease, status post bypass surgery in September 2003, hypertension, COPD, hyperlipidemia, peripheral vascular disease, cerebrovascular disease, bipolar affective disorder and carotid artery disease, who presents for followup consultation of his gait disorder. He is accompanied by his wife today. I first met him on 07/02/2012, at which time I felt that he had a most likely multifactorial gait problem, due to advancing age, fluctuating blood pressure values, lower back pain, and atheroclerotic disease. I suggested a brain MRI, particularly also because there is a family history brain tumor and a brain MRA. He recently had carotid Doppler studies on 05/10/2012 which showed less than 40% stenosis in the left internal carotid artery and 40-59% stenosis of the right internal carotid artery, not enough to explain his symptoms. He feels better regarding his BP values. He has not fallen. He feels exhausted. He has lost weight. He has a prostate nodule and is scheduled for a prostate Bx. He has occasional dizziness. He has less stamina. He briefly tried zoloft for 4 days, but stopped it. He wants to try it again. This was prescribed by a geriatric specialist he saw in [place]. He will see another geriatric specialist at [facility].  He had a brain MRI and MRA on 07/13/2012 and I reviewed the test results: Abnormal MRI scan of the brain showing mild changes of generalized cerebral atrophy. Overall no significant changes compared to MRI dated 06/12/2007. Normal MRA of the brain without evidence of significant stenosis of the large and medium-sized intracranial vessels. 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   He had been experiencing unsteadiness while walking for the last year. Thankfully he has  not fallen recently, but did have a black out spell some 3 1/2 years ago, falling backwards in the dining room, landing on the carpet with brief LOC, but no head injury.  He reported having had quite low BP values at times first thing in the morning. He was seen by ENT, endo, and cardiology. He was told, that his adrenal function was normal. He had a sleep study in the past and was told he has no OSA.  There is no family Hx of AD, or PD, but his older brother, age 79 has had some issues with confusion.  The patient is the youngest of 4 boys and his oldest brother disappeared at age 67, his other brother past away 18 mo ago at 74 with a brain tumor. He has been exercising regularly on the treadmill but lately has been coming back for fear of falling. He has also reduced his strengthening and weight lifting exercises d/t feeling of exhaustion.   His Past Medical History Is Significant For: Past Medical History  Diagnosis Date  . Hypertension   . S/P CABG x 4   . CAD (coronary artery disease)   . COPD (chronic obstructive pulmonary disease)   . Hyperlipidemia   . Chronic rhinitis   . Chronic respiratory failure   . Peripheral vascular disease   . Upper respiratory infection 08/2012    His Past Surgical History Is Significant For: Past Surgical History  Procedure Laterality Date  . Coronary artery bypass graft  2003  . Cataract extraction  11-20-11    His Family History Is Significant For: Family History  Problem Relation Age of Onset  .  Heart disease Mother   . Breast cancer Mother   . Cancer Mother     Breast cancer  . Hypertension Mother   . Other Mother     AAA  . Heart disease Father   . Heart attack Father   . Heart disease Brother   . Hyperlipidemia Brother   . Hypertension Brother   . Heart disease Brother   . Stroke Brother     x3, still living   . Peripheral vascular disease Brother   . Allergies Brother     His Social History Is Significant For: History    Social History  . Marital Status: Married    Spouse Name: N/A    Number of Children: 2  . Years of Education: N/A   Occupational History  . 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 Smoking status: Former Smoker -- 2.00 packs/day for 33 years    Types: Cigarettes    Quit date: 01/09/1981  . Smokeless tobacco: Never Used  . Alcohol Use: Yes     Comment: 1.5-3 oz daily  . Drug Use: No  . Sexual Activity: None   Other Topics Concern  . None   Social History Narrative   Pt lives at home with his spouse.          His Allergies Are:  Allergies  Allergen Reactions  . Cefdinir     REACTION: diarrhea  . Sulfonamide Derivatives     REACTION: facial/tongue swelling  :   His Current Medications Are:  Outpatient Encounter Prescriptions as of 11/04/2012  Medication Sig Dispense Refill  . Aspirin Buf,AlHyd-MgHyd-CaCar, (ASCRIPTIN) 325 MG TABS Take 1 tablet by mouth daily.        Marland Kitchen atorvastatin (LIPITOR) 20 MG tablet Take 20 mg by mouth at bedtime.        . ATROVENT HFA 17 MCG/ACT inhaler INHALE 2 PUFFS BY MOUTH FOUR TIMES DAILY AS NEEDED FOR WHEEZING  12.9 g  1  . Cholecalciferol (VITAMIN D3) 1000 UNITS CAPS Take 1 capsule (1,000 Units total) by mouth daily.      . furosemide (LASIX) 20 MG tablet Take 1 tablet (20 mg total) by mouth daily as needed.  30 tablet  3  . guaiFENesin (MUCINEX) 600 MG 12 hr tablet 1-2 tablets every 12 hours as needed        . ipratropium (ATROVENT) 0.02 % nebulizer solution INHALE 1 VIAL VIA NEBULIZER FOUR TIMES DAILY      . levalbuterol (XOPENEX) 1.25 MG/3ML nebulizer solution Take 1.25 mg by nebulization as needed.      . loratadine (CLARITIN) 10 MG tablet Take 10 mg by mouth daily as needed.        . metoprolol succinate (TOPROL-XL) 25 MG 24 hr tablet Take 1 tablet (25 mg total) by mouth daily. 1 tab daily  30 tablet  3  . Multiple Vitamin (MULTIVITAMIN) tablet Take 1 tablet by mouth daily.         Marland Kitchen NASONEX 50 MCG/ACT nasal spray INSTILL 2 SPRAYS IN EACH NOSTRIL DAILY AS NEEDED  17 g  5  . ranitidine (ZANTAC) 150 MG tablet Take 150 mg by mouth 2 (two) times daily as needed.      . SYMBICORT 160-4.5 MCG/ACT inhaler INHALE 2 PUFFS BY MOUTH TWICE DAILY  10.2 g  11  . [DISCONTINUED] ipratropium (ATROVENT) 0.02 % nebulizer solution INHALE 1 VIAL VIA NEBULIZER TWICE DAILY  180 mL  5   No facility-administered encounter medications on file as of 11/04/2012.  :  Review of Systems:  Out of a complete 14 point review of systems, all are reviewed and negative with the exception of these symptoms as listed below:  Review of Systems  Constitutional: Positive for fatigue and unexpected weight change (loss).  HENT: Positive for tinnitus.   Eyes: Positive for visual disturbance (blurred).  Respiratory: Positive for shortness of breath.   Cardiovascular: Positive for leg swelling.  Gastrointestinal: Positive for constipation.  Endocrine: Positive for cold intolerance.  Genitourinary: Positive for difficulty urinating.  Musculoskeletal: Positive for joint swelling and myalgias.  Skin: Negative.   Allergic/Immunologic: Positive for environmental allergies.  Neurological: Positive for dizziness.  Hematological: Bruises/bleeds easily.  Psychiatric/Behavioral: The patient is nervous/anxious.        Sleepiness    Objective:  Neurologic Exam  Physical Exam Physical Examination:   Filed Vitals:   11/04/12 1503  BP: 173/80  Pulse: 80  Temp: 97.7 F (36.5 C)   General Examination: The patient is a very pleasant 79 y.o. male in no acute distress. He appears somewhat frail appearing.   HEENT: Normocephalic, atraumatic, pupils are equal, round and reactive to light and accommodation. Status post cataract repair on the right is noted. I do have difficulty visualizing the left fundus to to cataract. Extraocular tracking is good without limitation to gaze excursion or nystagmus noted. Normal  smooth pursuit is noted. Hearing is grossly intact. Tympanic membranes are clear bilaterally. Face is symmetric with normal facial animation and normal facial sensation. Speech is clear with no dysarthria noted. There is no hypophonia. There is no lip, neck/head, jaw or voice tremor. Neck is supple with full range of passive and active motion. There are no carotid bruits on auscultation. Oropharynx exam reveals: mild mouth dryness, adequate dental hygiene and no significant airway crowding. Mallampati is class II. Tongue protrudes centrally and palate elevates symmetrically.   Chest: Clear to auscultation without wheezing, rhonchi or crackles noted.  Heart: S1+S2+0, regular and normal without murmurs, rubs or gallops noted.   Abdomen: Soft, non-tender and non-distended with normal bowel sounds appreciated on auscultation.  Extremities: There is 2+ pitting edema in the distal lower extremities bilaterally around both ankles.  Skin: Warm and dry without trophic changes noted. There are no varicose veins.  Musculoskeletal: exam reveals no obvious joint deformities, tenderness or joint swelling or erythema.   Neurologically:  Mental status: The patient is awake, alert and oriented in all 4 spheres. His memory, attention, language and knowledge are appropriate. There is no aphasia, agnosia, apraxia or anomia. Speech is clear with normal prosody and enunciation. Thought process is linear. Mood is congruent and affect is normal.  Cranial nerves are as described above under HEENT exam. In addition, shoulder shrug is normal with equal shoulder height noted. Motor exam: Normal bulk, strength and tone is noted. There is no drift, tremor or rebound. Romberg is negative. Reflexes are 2+ throughout, but absent in the ankles. Fine motor skills are intact with normal finger taps, normal hand movements, normal rapid alternating patting, normal foot taps and normal foot agility.  Cerebellar testing shows no  dysmetria or intention tremor on finger to nose testing. There is no truncal or gait ataxia.  Sensory exam is intact to light touch, pinprick, vibration, temperature sense and proprioception in the upper and lower extremities.  Gait, station and balance are unremarkable. No veering to one side is noted. No leaning to one side is noted.  Posture is age-appropriate and stance is narrow based. He walks cautiously but no limp is noted. No problems turning are noted. He turns en bloc. He does not have a tendency to look down while walking.  Assessment and Plan:   In summary, [name] is a very pleasant 79 year old male with a history of unsteadiness of his gait which most likely is a multifactorial problem, due to advancing age, fluctuating blood pressure values, lower back pain, fatigue from severe COPD, heart disease and peripheral vascular disease. His exam has remained stable from a but he feels worse with respect to fatigue and just overall not feeling well. There may be a component of depression. I would like to refer him to physical therapy at this juncture. I've asked him to start using a cane. He has a appointments with 80 reactive specialist at [facility] in December but they are requesting a sooner appointment. To that and I have entered a referral to a local geriatric specialist. I've asked him to drink more fluid. I've asked him to change positions slowly. He is encouraged to continue exercising but I would like for him to use a stationary bike rather than the treadmill for safety. He was in agreement. I will see him back routinely in 6 months, sooner if the need arises. I asked him to call with any interim questions, concerns, problems or updates and test results.

## 2012-11-04 NOTE — Patient Instructions (Addendum)
   I believe you have a gait disorder, which likely is due to a combination of things: normal aging, excessive alcohol use, degenerative arthritis of your back, and possible atherosclerosis of the blood vessels in your brain.   Remember to drink plenty of fluid, eat healthy meals and do not skip any meals. Try to eat protein with a every meal and eat a healthy snack such as fruit or nuts in between meals. Try to keep a regular sleep-wake schedule and try to exercise daily, particularly in the form of walking, 20-30 minutes a day, if you can. Change positions slowly and you should start using a cane.   As far as your medications are concerned, I would like to suggest no new medications. We will do physical therapy. I will refer you to a geriatric specialist.    As far as diagnostic testing: no new test at this time.

## 2012-11-05 ENCOUNTER — Telehealth: Payer: Self-pay | Admitting: Internal Medicine

## 2012-11-05 DIAGNOSIS — N200 Calculus of kidney: Secondary | ICD-10-CM | POA: Diagnosis not present

## 2012-11-05 MED ORDER — IPRATROPIUM BROMIDE 0.02 % IN SOLN: RESPIRATORY_TRACT | Status: DC

## 2012-11-05 MED ORDER — LEVALBUTEROL HCL 1.25 MG/3ML IN NEBU: 1.2500 mg | INHALATION_SOLUTION | RESPIRATORY_TRACT | Status: DC | PRN

## 2012-11-05 NOTE — Telephone Encounter (Signed)
I spoke with pt. He is aware will send in RX's for his nebulizer medications. Nothing further needed

## 2012-11-06 ENCOUNTER — Telehealth: Payer: Self-pay | Admitting: Family Medicine

## 2012-11-06 ENCOUNTER — Other Ambulatory Visit (HOSPITAL_COMMUNITY): Payer: Medicare Other

## 2012-11-06 NOTE — Addendum Note (Signed)
Addended by: Boone Master E on: 11/06/2012 05:02 PM   Modules accepted: Orders

## 2012-11-11 ENCOUNTER — Ambulatory Visit: Payer: Medicare Other | Attending: Neurology

## 2012-11-11 DIAGNOSIS — R5381 Other malaise: Secondary | ICD-10-CM | POA: Diagnosis not present

## 2012-11-11 DIAGNOSIS — IMO0001 Reserved for inherently not codable concepts without codable children: Secondary | ICD-10-CM | POA: Diagnosis not present

## 2012-11-12 ENCOUNTER — Telehealth: Payer: Self-pay | Admitting: Internal Medicine

## 2012-11-12 NOTE — Telephone Encounter (Signed)
Called the number above for PA on pt's Xopenex neb soln, spoke with rep [name].  Per [name], this medication does not require a prior authorization.  It is too soon to refill >> next time to refill is 11.9.14 >> gets a 25-day supply at a time, but is a PRN medication.  [name] that we were advised that a prior auth needs to be back-dated to 9.19.14.  Per [name], she did not see where anything regarding a PA was needed on this medication.  [name] for her help; nothing further needed at this time.  Called [facility] at the number above.  Spoke with pharmacist [name] who reported that the last refill needs to be back-dated for 9.19.14.  When I advised [name] of the above conversation and he stated that he will contact the PA department and see if they can fax over the info needed for this to the triage fax machine.  Will forward to my inbasket and check on tomorrow.

## 2012-11-15 ENCOUNTER — Ambulatory Visit: Payer: Medicare Other | Admitting: Rehabilitative and Restorative Service Providers"

## 2012-11-15 NOTE — Telephone Encounter (Signed)
Called [facility], spoke with [facility] pharmacist again He stated he had called again but no one could give him any details or answers on how to back date pt's xopenex Keko stated that he will try again over the weekend and call back on Monday Will continue to hold and follow up on

## 2012-11-15 NOTE — Telephone Encounter (Signed)
I spoke [facility] and [name] does not come in until later this afternoon. They will have him give [place] a call.

## 2012-11-19 ENCOUNTER — Ambulatory Visit: Payer: Medicare Other | Admitting: Rehabilitative and Restorative Service Providers"

## 2012-11-20 ENCOUNTER — Observation Stay (HOSPITAL_COMMUNITY)
Admission: EM | Admit: 2012-11-20 | Discharge: 2012-11-22 | Disposition: A | Discharge status: Home / Self Care | Payer: Medicare Other | Attending: Urology | Admitting: Urology

## 2012-11-20 ENCOUNTER — Encounter (HOSPITAL_COMMUNITY): Payer: Self-pay | Admitting: Emergency Medicine

## 2012-11-20 DIAGNOSIS — J9610 Chronic respiratory failure, unspecified whether with hypoxia or hypercapnia: Secondary | ICD-10-CM

## 2012-11-20 DIAGNOSIS — R310 Gross hematuria: Principal | ICD-10-CM | POA: Insufficient documentation

## 2012-11-20 DIAGNOSIS — J4489 Other specified chronic obstructive pulmonary disease: Secondary | ICD-10-CM

## 2012-11-20 DIAGNOSIS — IMO0002 Reserved for concepts with insufficient information to code with codable children: Secondary | ICD-10-CM | POA: Diagnosis not present

## 2012-11-20 DIAGNOSIS — Z9889 Other specified postprocedural states: Secondary | ICD-10-CM | POA: Diagnosis not present

## 2012-11-20 DIAGNOSIS — R972 Elevated prostate specific antigen [PSA]: Secondary | ICD-10-CM | POA: Diagnosis not present

## 2012-11-20 DIAGNOSIS — N138 Other obstructive and reflux uropathy: Secondary | ICD-10-CM | POA: Diagnosis not present

## 2012-11-20 DIAGNOSIS — E785 Hyperlipidemia, unspecified: Secondary | ICD-10-CM | POA: Diagnosis not present

## 2012-11-20 DIAGNOSIS — Z951 Presence of aortocoronary bypass graft: Secondary | ICD-10-CM | POA: Diagnosis not present

## 2012-11-20 DIAGNOSIS — I2510 Atherosclerotic heart disease of native coronary artery without angina pectoris: Secondary | ICD-10-CM

## 2012-11-20 DIAGNOSIS — R42 Dizziness and giddiness: Secondary | ICD-10-CM | POA: Insufficient documentation

## 2012-11-20 DIAGNOSIS — Z79899 Other long term (current) drug therapy: Secondary | ICD-10-CM | POA: Insufficient documentation

## 2012-11-20 DIAGNOSIS — J449 Chronic obstructive pulmonary disease, unspecified: Secondary | ICD-10-CM | POA: Diagnosis not present

## 2012-11-20 DIAGNOSIS — N402 Nodular prostate without lower urinary tract symptoms: Secondary | ICD-10-CM | POA: Insufficient documentation

## 2012-11-20 DIAGNOSIS — I10 Essential (primary) hypertension: Secondary | ICD-10-CM

## 2012-11-20 DIAGNOSIS — N400 Enlarged prostate without lower urinary tract symptoms: Secondary | ICD-10-CM | POA: Diagnosis not present

## 2012-11-20 DIAGNOSIS — R0602 Shortness of breath: Secondary | ICD-10-CM | POA: Diagnosis not present

## 2012-11-20 DIAGNOSIS — N401 Enlarged prostate with lower urinary tract symptoms: Secondary | ICD-10-CM | POA: Diagnosis not present

## 2012-11-20 DIAGNOSIS — Z7982 Long term (current) use of aspirin: Secondary | ICD-10-CM | POA: Insufficient documentation

## 2012-11-20 MED ORDER — IPRATROPIUM BROMIDE 0.02 % IN SOLN
0.5000 mg | Freq: Once | RESPIRATORY_TRACT | Status: AC
  Administered 2012-11-20: 0.5 mg via RESPIRATORY_TRACT
  Filled 2012-11-20: qty 2.5

## 2012-11-20 MED ORDER — ALBUTEROL SULFATE (5 MG/ML) 0.5% IN NEBU
5.0000 mg | INHALATION_SOLUTION | Freq: Once | RESPIRATORY_TRACT | Status: DC
  Filled 2012-11-20: qty 1

## 2012-11-20 NOTE — ED Notes (Signed)
Pt had a prostate biopsy this afternoon and since he's had blood in his urine and the urge to urinate, he states that he hasn't urinated normally since the procedure

## 2012-11-20 NOTE — ED Provider Notes (Signed)
CSN: [id number]     Arrival date & time 11/20/12  2021 History   First MD Initiated Contact with Patient 11/20/12 2040     Chief Complaint  Patient presents with  . Hematuria    HPI  KHAIR CHASTEEN is a 79 y.o. male with a PMH of CAD s/p CABG, COPD, HTN, HLD, chronic respiratory failure, and PVD who presents to the ED for evaluation of hematuria.  History was provided by the patient and his wife.  Patient had a prostate biopsy this afternoon at 4:00 PM with Dr. Mena Goes.  He states he had some difficulty urinating at the office before discharge, but felt well enough to go home.  He states that he has not been able to urinate since 6:00 PM this evening.  He has had a few episodes of gross hematuria with small blood clots present.  He complains of bladder pressure and burning with urination.  He denies any abdominal pain, nausea, emesis, diarrhea, or constipation.  He denies any chest pain or change in SOB (at baseline).  He is on home O2 as needed for COPD.  He states he is very anxious about his inability to urinate and significant urge.  No fevers.  No rectal pain or bleeding.  He takes a daily aspirin but is not on anti-coagulation (warfarin/other).      Past Medical History  Diagnosis Date  . Hypertension   . S/P CABG x 4   . CAD (coronary artery disease)   . COPD (chronic obstructive pulmonary disease)   . Hyperlipidemia   . Chronic rhinitis   . Chronic respiratory failure   . Peripheral vascular disease   . Upper respiratory infection 08/2012   Past Surgical History  Procedure Laterality Date  . Coronary artery bypass graft  2003  . Cataract extraction  11-20-11   Family History  Problem Relation Age of Onset  . Heart disease Mother   . Breast cancer Mother   . Cancer Mother     Breast cancer  . Hypertension Mother   . Other Mother     AAA  . Heart disease Father   . Heart attack Father   . Heart disease Brother   . Hyperlipidemia Brother   . Hypertension Brother   .  Heart disease Brother   . Stroke Brother     x3, still living   . Peripheral vascular disease Brother   . Allergies Brother    History  Substance Use Topics  . Smoking status: Former Smoker -- 2.00 packs/day for 33 years    Types: Cigarettes    Quit date: 01/09/1981  . Smokeless tobacco: Never Used  . Alcohol Use: Yes     Comment: 1.5-3 oz daily    Review of Systems  Constitutional: Negative for fever, chills, activity change, appetite change and fatigue.  Respiratory: Positive for shortness of breath (at baseline). Negative for cough.   Cardiovascular: Negative for chest pain and leg swelling.  Gastrointestinal: Negative for nausea, vomiting, abdominal pain, diarrhea, constipation and rectal pain.  Genitourinary: Positive for dysuria, urgency, hematuria and difficulty urinating. Negative for frequency, flank pain, discharge, penile swelling, scrotal swelling, genital sores, penile pain and testicular pain.  Musculoskeletal: Negative for back pain.  Skin: Negative for color change and wound.  Neurological: Negative for dizziness, weakness, light-headedness and headaches.    Allergies  Cefdinir and Sulfonamide derivatives  Home Medications   Current Outpatient Rx  Name  Route  Sig  Dispense  Refill  .  aspirin 325 MG tablet   Oral   Take 325 mg by mouth every morning.         Marland Kitchen atorvastatin (LIPITOR) 20 MG tablet   Oral   Take 20 mg by mouth at bedtime.           . Cholecalciferol (VITAMIN D3) 1000 UNITS CAPS   Oral   Take 1 capsule (1,000 Units total) by mouth daily.         . furosemide (LASIX) 20 MG tablet   Oral   Take 1 tablet (20 mg total) by mouth daily as needed.   30 tablet   3   . guaiFENesin (MUCINEX) 600 MG 12 hr tablet      1-2 tablets every 12 hours as needed           . ipratropium (ATROVENT) 0.02 % nebulizer solution      INHALE 1 VIAL VIA NEBULIZER FOUR TIMES DAILY. DX 496   300 mL   6   . levalbuterol (XOPENEX HFA) 45 MCG/ACT  inhaler   Inhalation   Inhale 2 puffs into the lungs every 4 (four) hours as needed for wheezing or shortness of breath (((PLAN A))).         . levalbuterol (XOPENEX) 1.25 MG/3ML nebulizer solution   Nebulization   Take 1.25 mg by nebulization every 4 (four) hours as needed for wheezing or shortness of breath (((PLAN B))). Dx 496         . loratadine (CLARITIN) 10 MG tablet   Oral   Take 10 mg by mouth daily as needed.           . metoprolol succinate (TOPROL-XL) 25 MG 24 hr tablet   Oral   Take 1 tablet (25 mg total) by mouth daily. 1 tab daily   30 tablet   3   . Multiple Vitamin (MULTIVITAMIN) tablet   Oral   Take 1 tablet by mouth daily.           . ranitidine (ZANTAC) 150 MG tablet   Oral   Take 150 mg by mouth 2 (two) times daily.          . Sodium Chloride-Sodium Bicarb (AYR SALINE NASAL RINSE NA)      as needed.         . SYMBICORT 160-4.5 MCG/ACT inhaler      INHALE 2 PUFFS BY MOUTH TWICE DAILY   10.2 g   11    BP 181/104  Pulse 111  Temp(Src) 98.1 F (36.7 C) (Oral)  Resp 20  SpO2 100%  Filed Vitals:   11/20/12 2038 11/20/12 2100 11/20/12 2337  BP: 181/104 133/79   Pulse: 111 95   Temp: 98.1 F (36.7 C)    TempSrc: Oral    Resp: 20 21   SpO2: 100% 96% 98%    Physical Exam  Nursing note and vitals reviewed. Constitutional: He is oriented to person, place, and time. He appears well-developed and well-nourished. No distress.  HENT:  Head: Normocephalic and atraumatic.  Right Ear: External ear normal.  Left Ear: External ear normal.  Nose: Nose normal.  Eyes: Conjunctivae are normal. Right eye exhibits no discharge. Left eye exhibits no discharge.  Neck: Normal range of motion. Neck supple.  Cardiovascular: Normal rate, regular rhythm and normal heart sounds.  Exam reveals no gallop and no friction rub.   No murmur heard. Pulmonary/Chest: Effort normal and breath sounds normal. No respiratory distress. He has no wheezes. He has  no  rales. He exhibits no tenderness.  Abdominal: Soft. Bowel sounds are normal. He exhibits no distension and no mass. There is no tenderness. There is no rebound and no guarding.  Genitourinary:  No penile tenderness.  No penile discharge/bleeding/edema.  No testicular tenderness.    Musculoskeletal: Normal range of motion. He exhibits no edema and no tenderness.  No leg edema bilaterally  Neurological: He is alert and oriented to person, place, and time.  Skin: Skin is warm and dry. He is not diaphoretic.    ED Course  Procedures (including critical care time) Labs Review Labs Reviewed - No data to display Imaging Review No results found.  EKG Interpretation   None      Results for orders placed during the hospital encounter of 11/20/12  CBC WITH DIFFERENTIAL      Result Value Range   WBC 9.9  4.0 - 10.5 K/uL   RBC 3.67 (*) 4.22 - 5.81 MIL/uL   Hemoglobin 11.5 (*) 13.0 - 17.0 g/dL   HCT 57.8 (*) 46.9 - 62.9 %   MCV 90.2  78.0 - 100.0 fL   MCH 31.3  26.0 - 34.0 pg   MCHC 34.7  30.0 - 36.0 g/dL   RDW 52.8  41.3 - 24.4 %   Platelets 180  150 - 400 K/uL   Neutrophils Relative % 82 (*) 43 - 77 %   Neutro Abs 8.1 (*) 1.7 - 7.7 K/uL   Lymphocytes Relative 9 (*) 12 - 46 %   Lymphs Abs 0.9  0.7 - 4.0 K/uL   Monocytes Relative 9  3 - 12 %   Monocytes Absolute 0.9  0.1 - 1.0 K/uL   Eosinophils Relative 0  0 - 5 %   Eosinophils Absolute 0.0  0.0 - 0.7 K/uL   Basophils Relative 0  0 - 1 %   Basophils Absolute 0.0  0.0 - 0.1 K/uL  CBC      Result Value Range   WBC 9.9  4.0 - 10.5 K/uL   RBC 3.84 (*) 4.22 - 5.81 MIL/uL   Hemoglobin 12.2 (*) 13.0 - 17.0 g/dL   HCT 01.0 (*) 27.2 - 53.6 %   MCV 90.4  78.0 - 100.0 fL   MCH 31.8  26.0 - 34.0 pg   MCHC 35.2  30.0 - 36.0 g/dL   RDW 64.4  03.4 - 74.2 %   Platelets 188  150 - 400 K/uL  BASIC METABOLIC PANEL      Result Value Range   Sodium 132 (*) 135 - 145 mEq/L   Potassium 3.8  3.5 - 5.1 mEq/L   Chloride 97  96 - 112 mEq/L   CO2  25  19 - 32 mEq/L   Glucose, Bld 96  70 - 99 mg/dL   BUN 14  6 - 23 mg/dL   Creatinine, Ser 5.95  0.50 - 1.35 mg/dL   Calcium 9.3  8.4 - 63.8 mg/dL   GFR calc non Af Amer 76 (*) >90 mL/min   GFR calc Af Amer 88 (*) >90 mL/min  PROTIME-INR      Result Value Range   Prothrombin Time 15.0  11.6 - 15.2 seconds   INR 1.21  0.00 - 1.49  APTT      Result Value Range   aPTT 33  24 - 37 seconds   MDM   PHILLP DOLORES is a 79 y.o. male with a PMH of CAD s/p CABG, COPD, HTN, HLD, chronic respiratory  failure, and PVD who presents to the ED for evaluation of hematuria.   Rechecks  9:30 PM = Will insert foley per urology.   12:26 AM = Patient's urine was clearing until another blood clot loosened.  Gross blood since then.  Patient has been flushed with over 600 cc's of fluid this far per RN staff.  Will let sit for another 15 minutes.  If urine not clearing plan is to admit.  Patient states he has mild discomfort but does not want anything for pain right now.  Patient offered pain medications.   1:00 AM = Patient still passing large clots.  Spoke with patient about admission.  Patient is in agreement with admission overnight.    Consults  9:30 PM = Dr. Jeraldine Loots spoke with urology who states to place a foley catheter and likely discharge home after flushing.   01:15 AM = Spoke with urology Dr. Sherron Monday who states to admit under Dr. Mena Goes who will see him in the morning.  Temporary admit orders put in.  Foley flushing PRN orders for RN staff as well.      Patient seen in the ED for dysuria and hematuria s/p prostate biopsy.  A foley catheter was placed in the ED with passage of multiple clots.  Patient's foley was flushed multiple times by RN staff due to continued clot formation.  Patient had gross hematuria which did not clear after foley irritation.  Patient was admitted for continued foley care.  Temporary admit orders were placed.  Patient will be seen by urology tomorrow morning.  Patient and  family in agreement with admission and plan.   Final impressions: 1. CAD (coronary artery disease)   2. Chronic airway obstruction, not elsewhere classified   3. Essential hypertension   4. Chronic respiratory failure   5. Hematuria    Luiz Iron PA-C   This patient was discussed with Dr. Vinetta Bergamo, PA-C 11/22/12 [id number]

## 2012-11-21 DIAGNOSIS — J449 Chronic obstructive pulmonary disease, unspecified: Secondary | ICD-10-CM

## 2012-11-21 DIAGNOSIS — N138 Other obstructive and reflux uropathy: Secondary | ICD-10-CM | POA: Diagnosis not present

## 2012-11-21 DIAGNOSIS — N401 Enlarged prostate with lower urinary tract symptoms: Secondary | ICD-10-CM | POA: Diagnosis not present

## 2012-11-21 DIAGNOSIS — I2510 Atherosclerotic heart disease of native coronary artery without angina pectoris: Secondary | ICD-10-CM

## 2012-11-21 DIAGNOSIS — R310 Gross hematuria: Secondary | ICD-10-CM | POA: Diagnosis not present

## 2012-11-21 DIAGNOSIS — I10 Essential (primary) hypertension: Secondary | ICD-10-CM | POA: Diagnosis not present

## 2012-11-21 DIAGNOSIS — R972 Elevated prostate specific antigen [PSA]: Secondary | ICD-10-CM | POA: Diagnosis not present

## 2012-11-21 LAB — CBC WITH DIFFERENTIAL/PLATELET
Basophils Absolute: 0 10*3/uL (ref 0.0–0.1)
Basophils Relative: 0 % (ref 0–1)
Eosinophils Absolute: 0 10*3/uL (ref 0.0–0.7)
Eosinophils Relative: 0 % (ref 0–5)
HCT: 33.1 % — ABNORMAL LOW (ref 39.0–52.0)
Hemoglobin: 11.5 g/dL — ABNORMAL LOW (ref 13.0–17.0)
Lymphocytes Relative: 9 % — ABNORMAL LOW (ref 12–46)
Lymphs Abs: 0.9 10*3/uL (ref 0.7–4.0)
MCH: 31.3 pg (ref 26.0–34.0)
MCHC: 34.7 g/dL (ref 30.0–36.0)
MCV: 90.2 fL (ref 78.0–100.0)
Monocytes Absolute: 0.9 10*3/uL (ref 0.1–1.0)
Monocytes Relative: 9 % (ref 3–12)
Neutro Abs: 8.1 10*3/uL — ABNORMAL HIGH (ref 1.7–7.7)
Neutrophils Relative %: 82 % — ABNORMAL HIGH (ref 43–77)
Platelets: 180 10*3/uL (ref 150–400)
RBC: 3.67 MIL/uL — ABNORMAL LOW (ref 4.22–5.81)
RDW: 12.3 % (ref 11.5–15.5)
WBC: 9.9 10*3/uL (ref 4.0–10.5)

## 2012-11-21 LAB — BASIC METABOLIC PANEL
BUN: 14 mg/dL (ref 6–23)
CO2: 25 mEq/L (ref 19–32)
Calcium: 9.3 mg/dL (ref 8.4–10.5)
Chloride: 97 mEq/L (ref 96–112)
Creatinine, Ser: 0.94 mg/dL (ref 0.50–1.35)
GFR calc Af Amer: 88 mL/min — ABNORMAL LOW (ref >90)
GFR calc non Af Amer: 76 mL/min — ABNORMAL LOW (ref >90)
Glucose, Bld: 96 mg/dL (ref 70–99)
Potassium: 3.8 mEq/L (ref 3.5–5.1)
Sodium: 132 mEq/L — ABNORMAL LOW (ref 135–145)

## 2012-11-21 LAB — CBC
HCT: 34.7 % — ABNORMAL LOW (ref 39.0–52.0)
Hemoglobin: 12.2 g/dL — ABNORMAL LOW (ref 13.0–17.0)
MCH: 31.8 pg (ref 26.0–34.0)
MCHC: 35.2 g/dL (ref 30.0–36.0)
MCV: 90.4 fL (ref 78.0–100.0)
Platelets: 188 10*3/uL (ref 150–400)
RBC: 3.84 MIL/uL — ABNORMAL LOW (ref 4.22–5.81)
RDW: 12.3 % (ref 11.5–15.5)
WBC: 9.9 10*3/uL (ref 4.0–10.5)

## 2012-11-21 LAB — PROTIME-INR
INR: 1.21 (ref 0.00–1.49)
Prothrombin Time: 15 seconds (ref 11.6–15.2)

## 2012-11-21 LAB — APTT: aPTT: 33 seconds (ref 24–37)

## 2012-11-21 MED ORDER — IPRATROPIUM BROMIDE 0.02 % IN SOLN
0.5000 mg | Freq: Four times a day (QID) | RESPIRATORY_TRACT | Status: DC
  Administered 2012-11-21 – 2012-11-22 (×3): 0.5 mg via RESPIRATORY_TRACT
  Filled 2012-11-21 (×3): qty 2.5

## 2012-11-21 MED ORDER — ALBUTEROL SULFATE (5 MG/ML) 0.5% IN NEBU: 2.5000 mg | INHALATION_SOLUTION | Freq: Four times a day (QID) | RESPIRATORY_TRACT | Status: DC

## 2012-11-21 MED ORDER — TAMSULOSIN HCL 0.4 MG PO CAPS
0.4000 mg | ORAL_CAPSULE | Freq: Every day | ORAL | Status: DC
  Administered 2012-11-21: 0.4 mg via ORAL
  Filled 2012-11-21 (×2): qty 1

## 2012-11-21 MED ORDER — ACETAMINOPHEN 325 MG PO TABS: 650.0000 mg | ORAL_TABLET | ORAL | Status: DC | PRN

## 2012-11-21 MED ORDER — ATORVASTATIN CALCIUM 20 MG PO TABS
20.0000 mg | ORAL_TABLET | Freq: Every day | ORAL | Status: DC
  Administered 2012-11-21: 20 mg via ORAL
  Filled 2012-11-21 (×2): qty 1

## 2012-11-21 MED ORDER — HYDROCODONE-ACETAMINOPHEN 5-325 MG PO TABS: 1.0000 | ORAL_TABLET | ORAL | Status: DC | PRN

## 2012-11-21 MED ORDER — FAMOTIDINE 20 MG PO TABS
20.0000 mg | ORAL_TABLET | Freq: Every day | ORAL | Status: DC
  Administered 2012-11-21 – 2012-11-22 (×2): 20 mg via ORAL
  Filled 2012-11-21 (×2): qty 1

## 2012-11-21 MED ORDER — CIPROFLOXACIN HCL 500 MG PO TABS: 500.0000 mg | ORAL_TABLET | Freq: Two times a day (BID) | ORAL | Status: DC

## 2012-11-21 MED ORDER — IPRATROPIUM BROMIDE 0.02 % IN SOLN: 0.5000 mg | Freq: Four times a day (QID) | RESPIRATORY_TRACT | Status: DC

## 2012-11-21 MED ORDER — METOPROLOL SUCCINATE ER 25 MG PO TB24
25.0000 mg | ORAL_TABLET | Freq: Every day | ORAL | Status: DC
  Administered 2012-11-21: 25 mg via ORAL
  Filled 2012-11-21 (×2): qty 1

## 2012-11-21 MED ORDER — BUDESONIDE-FORMOTEROL FUMARATE 160-4.5 MCG/ACT IN AERO
2.0000 | INHALATION_SPRAY | Freq: Two times a day (BID) | RESPIRATORY_TRACT | Status: DC
  Administered 2012-11-21 – 2012-11-22 (×2): 2 via RESPIRATORY_TRACT
  Filled 2012-11-21: qty 6

## 2012-11-21 MED ORDER — ALBUTEROL SULFATE (5 MG/ML) 0.5% IN NEBU: 5.0000 mg | INHALATION_SOLUTION | Freq: Four times a day (QID) | RESPIRATORY_TRACT | Status: DC

## 2012-11-21 MED ORDER — ALBUTEROL SULFATE (5 MG/ML) 0.5% IN NEBU: 2.5000 mg | INHALATION_SOLUTION | RESPIRATORY_TRACT | Status: DC | PRN

## 2012-11-21 MED ORDER — ALBUTEROL SULFATE (5 MG/ML) 0.5% IN NEBU
5.0000 mg | INHALATION_SOLUTION | Freq: Four times a day (QID) | RESPIRATORY_TRACT | Status: DC
  Administered 2012-11-21: 2.5 mg via RESPIRATORY_TRACT
  Filled 2012-11-21: qty 1

## 2012-11-21 MED ORDER — METOPROLOL SUCCINATE ER 25 MG PO TB24
25.0000 mg | ORAL_TABLET | Freq: Every day | ORAL | Status: DC
  Filled 2012-11-21: qty 1

## 2012-11-21 MED ORDER — LORATADINE 10 MG PO TABS
10.0000 mg | ORAL_TABLET | Freq: Every day | ORAL | Status: DC | PRN
  Administered 2012-11-21: 10 mg via ORAL
  Filled 2012-11-21 (×2): qty 1

## 2012-11-21 MED ORDER — CIPROFLOXACIN HCL 500 MG PO TABS
500.0000 mg | ORAL_TABLET | Freq: Two times a day (BID) | ORAL | Status: DC
  Administered 2012-11-21 – 2012-11-22 (×3): 500 mg via ORAL
  Filled 2012-11-21 (×4): qty 1

## 2012-11-21 MED ORDER — IPRATROPIUM BROMIDE 0.02 % IN SOLN
0.5000 mg | Freq: Four times a day (QID) | RESPIRATORY_TRACT | Status: DC
  Administered 2012-11-21 (×2): 0.5 mg via RESPIRATORY_TRACT
  Filled 2012-11-21 (×2): qty 2.5

## 2012-11-21 NOTE — Progress Notes (Signed)
Irrigated foley x 2 secondary to occluded foley(leaking at insertion site) , numerous clots noted.  Foley patent at present, draining hematuria

## 2012-11-21 NOTE — Telephone Encounter (Signed)
Called spoke with [facility] at [facility] since I had not heard anything Per Maia Breslow he has had extensive dealings with [employer] and they will have to file a manual claim to have this medication covered Per Maia Breslow, nothing is needed from our standpoint and he will call if this changes Will sign off.

## 2012-11-21 NOTE — Progress Notes (Signed)
Patient ID: [name], male   DOB: May 29, 1929, 79 y.o.   MRN: [id number]   Urine much clearer this evening. Light pink in tube and one small clot. He's been ambulating in room.   PE: NAD Abd soft, ND, NT Urine as above Ext - no CCE, no calf pain or swelling  Imp - gross hematuria Abnormal dre bph Elevated psa  Plan - Restart tamsulosin Void trial/foley removal in AM and D/c to home

## 2012-11-21 NOTE — Progress Notes (Signed)
Utilization review completed.  

## 2012-11-21 NOTE — Consult Note (Signed)
[facility] Medical Consultation  KALA GASSMANN ZOX:[id number] DOB: 1929/08/04 DOA: 11/20/2012 PCP:  Duane Lope, MD   Requesting physician: DR Mena Goes Date of consultation: 11/13 Reason for consultation: shortness of breath.  Impression/Recommendations    1. COPD: - Not in exacerbation. Resume home nebs and symbicort. Continue with oxygen as needed.   2. CAD, S/P CABG: pt denies any chest  Pain, palpitations or dizziness.   3. Vertigo: possibly BPPV, in view of his history. Vestibular eval with PT.   4. Hypertension: controlled. Resume home medications.   5. Anemia; stable  6. Gross hematuria: possibly from the retained clot. S/p prostate biopsy.   I will followup again tomorrow. Please contact me if I can be of assistance in the meanwhile. Thank you for this consultation.  Chief Complaint: Came in for gross hematuria last night.   HPI:  79 year old gentleman with h/o COPD, CAD s/p cabg ,hypertension, vertigo, bph, came in for gross hematuria. As per the patient he denies, chest pain, or dizziness, or palpitations. He has a h/o vestibular neuritis, but currently denies any tinnitus, or vertigo. He was admitted by Dr Mena Goes and was starteD on IV fluids. Pt reports  Some sob this am, also reports that he missed neb treatments yesterday. His sob resolved after albuterol treatment. No other complaints.   Review of Systems: see HPI  Past Medical History  Diagnosis Date  . Hypertension   . S/P CABG x 4   . CAD (coronary artery disease)   . COPD (chronic obstructive pulmonary disease)   . Hyperlipidemia   . Chronic rhinitis   . Chronic respiratory failure   . Peripheral vascular disease   . Upper respiratory infection 08/2012   Past Surgical History  Procedure Laterality Date  . Coronary artery bypass graft  2003  . Cataract extraction  11-20-11   Social History:  reports that he quit smoking about 31 years ago. His smoking use included Cigarettes. He has a 66  pack-year smoking history. He has never used smokeless tobacco. He reports that he drinks alcohol. He reports that he does not use illicit drugs.  Allergies  Allergen Reactions  . Cefdinir     REACTION: diarrhea  . Sulfonamide Derivatives     REACTION: facial/tongue swelling   Family History  Problem Relation Age of Onset  . Heart disease Mother   . Breast cancer Mother   . Cancer Mother     Breast cancer  . Hypertension Mother   . Other Mother     AAA  . Heart disease Father   . Heart attack Father   . Heart disease Brother   . Hyperlipidemia Brother   . Hypertension Brother   . Heart disease Brother   . Stroke Brother     x3, still living   . Peripheral vascular disease Brother   . Allergies Brother     Prior to Admission medications   Medication Sig Start Date End Date Taking? Authorizing Provider  aspirin 325 MG tablet Take 325 mg by mouth every morning.    Historical Provider, MD  atorvastatin (LIPITOR) 20 MG tablet Take 20 mg by mouth at bedtime.      Historical Provider, MD  Cholecalciferol (VITAMIN D3) 1000 UNITS CAPS Take 1 capsule (1,000 Units total) by mouth daily. 03/14/11   Tammy S Parrett, NP  ciprofloxacin (CIPRO) 500 MG tablet Take 500 mg by mouth 2 (two) times daily. 11/20/12   Historical Provider, MD  furosemide (LASIX) 20 MG  tablet Take 1 tablet (20 mg total) by mouth daily as needed. 04/05/12 03/26/14  Tammy S Parrett, NP  guaiFENesin (MUCINEX) 600 MG 12 hr tablet 1-2 tablets every 12 hours as needed      Historical Provider, MD  ipratropium (ATROVENT) 0.02 % nebulizer solution INHALE 1 VIAL VIA NEBULIZER FOUR TIMES DAILY. DX 496 11/05/12   Nyoka Cowden, MD  levalbuterol [facility] HFA) 45 MCG/ACT inhaler Inhale 2 puffs into the lungs every 4 (four) hours as needed for wheezing or shortness of breath (((PLAN A))).    Historical Provider, MD  levalbuterol (XOPENEX) 1.25 MG/3ML nebulizer solution Take 1.25 mg by nebulization every 4 (four) hours as needed for  wheezing or shortness of breath (((PLAN B))). Dx 496 11/05/12 11/28/14  Nyoka Cowden, MD  loratadine (CLARITIN) 10 MG tablet Take 10 mg by mouth daily as needed.      Historical Provider, MD  metoprolol succinate (TOPROL-XL) 25 MG 24 hr tablet Take 1 tablet (25 mg total) by mouth daily. 1 tab daily 10/24/12   Lyn Records III, MD  Multiple Vitamin (MULTIVITAMIN) tablet Take 1 tablet by mouth daily.      Historical Provider, MD  ranitidine (ZANTAC) 150 MG tablet Take 150 mg by mouth 2 (two) times daily.  03/14/11   Tammy S Parrett, NP  Sodium Chloride-Sodium Bicarb (AYR SALINE NASAL RINSE NA) as needed.    Historical Provider, MD  SYMBICORT 160-4.5 MCG/ACT inhaler INHALE 2 PUFFS BY MOUTH TWICE DAILY 01/30/12   Nyoka Cowden, MD   Physical Exam: Blood pressure 142/73, pulse 77, temperature 97.8 F (36.6 C), temperature source Oral, resp. rate 18, height 5\' 9"  (1.753 m), weight 65.091 kg (143 lb 8 oz), SpO2 98.00%. Filed Vitals:   11/21/12 0600  BP: 142/73  Pulse: 77  Temp: 97.8 F (36.6 C)  Resp: 18    Constitutional: Vital signs reviewed.  Patient is a well-developed and well-nourished in no acute distress and cooperative with exam. Alert and oriented x3.  Head: Normocephalic and atraumatic Mouth: no erythema or exudates, MMM Eyes: PERRL, EOMI, conjunctivae normal, No scleral icterus.  Neck: Supple, Trachea midline normal ROM, No JVD, mass, thyromegaly, or carotid bruit present.  Cardiovascular: RRR, S1 normal, S2 normal, no MRG, pulses symmetric and intact bilaterally Pulmonary/Chest: normal respiratory effort, CTAB, no wheezes, rales, or rhonchi Abdominal: Soft. Non-tender, non-distended, bowel sounds are normal, no masses, organomegaly, or guarding present.  Musculoskeletal: No joint deformities, erythema, or stiffness, ROM full and no nontender Neurological: A&O x3, Strength is normal and symmetric bilaterally,no focal motor deficit, sensory intact to light touch bilaterally. speech  normal.  Skin: Warm, dry and intact. No rash, cyanosis, or clubbing.  Psychiatric: Normal mood and affect. speech and behavior is normal.    Labs on Admission:  Basic Metabolic Panel:  Recent Labs Lab 11/21/12 0912  NA 132*  K 3.8  CL 97  CO2 25  GLUCOSE 96  BUN 14  CREATININE 0.94  CALCIUM 9.3   Liver Function Tests: No results found for this basename: AST, ALT, ALKPHOS, BILITOT, PROT, ALBUMIN,  in the last 168 hours No results found for this basename: LIPASE, AMYLASE,  in the last 168 hours No results found for this basename: AMMONIA,  in the last 168 hours CBC:  Recent Labs Lab 11/21/12 0330 11/21/12 0912  WBC 9.9 9.9  NEUTROABS 8.1*  --   HGB 11.5* 12.2*  HCT 33.1* 34.7*  MCV 90.2 90.4  PLT 180 188  Cardiac Enzymes: No results found for this basename: CKTOTAL, CKMB, CKMBINDEX, TROPONINI,  in the last 168 hours BNP: No components found with this basename: POCBNP,  CBG: No results found for this basename: GLUCAP,  in the last 168 hours  Radiological Exams on Admission: No results found.    Time spent: 45  min  Shevy Yaney [facility] Pager [phone]  If 7PM-7AM, please contact night-coverage [email or web address] Password [email or web address] 11/21/2012, 11:26 AM

## 2012-11-21 NOTE — H&P (Addendum)
H&P:  History of Present Illness: 79 yo h/o BPH, abnormal prostate exam and PSA 23. He underwent prostate biopsy yesterday afternoon in the office to rule out advanced/high risk prostate cancer.  He tolerated the procedure well but was a bit lightheaded when he stood up.  He was observed in the office and noted to have stable vital signs.  He voided and then was allowed to go home.  He presented to the emergency room last night with gross hematuria and possible clot retention.  A Foley catheter was placed and he was admitted to the hospital.  He denies CP, but says he is mildly short of breath. He is on O2 per John Day at baseline due to COPD. He also has h/o CAD s/p CABG, COPD, HTN, HLD, chronic respiratory failure, and PVD.    Past Medical History  Diagnosis Date  . Hypertension   . S/P CABG x 4   . CAD (coronary artery disease)   . COPD (chronic obstructive pulmonary disease)   . Hyperlipidemia   . Chronic rhinitis   . Chronic respiratory failure   . Peripheral vascular disease   . Upper respiratory infection 08/2012   Past Surgical History  Procedure Laterality Date  . Coronary artery bypass graft  2003  . Cataract extraction  11-20-11    Home Medications:  Prescriptions prior to admission  Medication Sig Dispense Refill  . aspirin 325 MG tablet Take 325 mg by mouth every morning.      Marland Kitchen atorvastatin (LIPITOR) 20 MG tablet Take 20 mg by mouth at bedtime.        . Cholecalciferol (VITAMIN D3) 1000 UNITS CAPS Take 1 capsule (1,000 Units total) by mouth daily.      . ciprofloxacin (CIPRO) 500 MG tablet Take 500 mg by mouth 2 (two) times daily.      . furosemide (LASIX) 20 MG tablet Take 1 tablet (20 mg total) by mouth daily as needed.  30 tablet  3  . guaiFENesin (MUCINEX) 600 MG 12 hr tablet 1-2 tablets every 12 hours as needed        . ipratropium (ATROVENT) 0.02 % nebulizer solution INHALE 1 VIAL VIA NEBULIZER FOUR TIMES DAILY. DX 496  300 mL  6  . levalbuterol (XOPENEX HFA) 45  MCG/ACT inhaler Inhale 2 puffs into the lungs every 4 (four) hours as needed for wheezing or shortness of breath (((PLAN A))).      . levalbuterol (XOPENEX) 1.25 MG/3ML nebulizer solution Take 1.25 mg by nebulization every 4 (four) hours as needed for wheezing or shortness of breath (((PLAN B))). Dx 496      . loratadine (CLARITIN) 10 MG tablet Take 10 mg by mouth daily as needed.        . metoprolol succinate (TOPROL-XL) 25 MG 24 hr tablet Take 1 tablet (25 mg total) by mouth daily. 1 tab daily  30 tablet  3  . Multiple Vitamin (MULTIVITAMIN) tablet Take 1 tablet by mouth daily.        . ranitidine (ZANTAC) 150 MG tablet Take 150 mg by mouth 2 (two) times daily.       . Sodium Chloride-Sodium Bicarb (AYR SALINE NASAL RINSE NA) as needed.      . SYMBICORT 160-4.5 MCG/ACT inhaler INHALE 2 PUFFS BY MOUTH TWICE DAILY  10.2 g  11   Allergies:  Allergies  Allergen Reactions  . Cefdinir     REACTION: diarrhea  . Sulfonamide Derivatives     REACTION: facial/tongue swelling  Family History  Problem Relation Age of Onset  . Heart disease Mother   . Breast cancer Mother   . Cancer Mother     Breast cancer  . Hypertension Mother   . Other Mother     AAA  . Heart disease Father   . Heart attack Father   . Heart disease Brother   . Hyperlipidemia Brother   . Hypertension Brother   . Heart disease Brother   . Stroke Brother     x3, still living   . Peripheral vascular disease Brother   . Allergies Brother    Social History:  reports that he quit smoking about 31 years ago. His smoking use included Cigarettes. He has a 66 pack-year smoking history. He has never used smokeless tobacco. He reports that he drinks alcohol. He reports that he does not use illicit drugs.  ROS: A complete review of systems was performed.  All systems are negative except for pertinent findings as noted. Review of Systems  Respiratory: Positive for shortness of breath.   All other systems reviewed and are  negative.     Physical Exam:  Vital signs in last 24 hours: Temp:  [97.8 F (36.6 C)-98.1 F (36.7 C)] 97.8 F (36.6 C) (11/13 0600) Pulse Rate:  [77-111] 77 (11/13 0600) Resp:  [18-21] 18 (11/13 0600) BP: (133-181)/(69-104) 142/73 mmHg (11/13 0600) SpO2:  [96 %-100 %] 98 % (11/13 0600) Weight:  [65.091 kg (143 lb 8 oz)] 65.091 kg (143 lb 8 oz) (11/13 0215) General:  Alert and oriented, No acute distress HEENT: Normocephalic, atraumatic Neck: No JVD or lymphadenopathy Cardiovascular: Regular rate and rhythm Lungs: Regular rate and effort Abdomen: Soft, nontender, nondistended, no abdominal masses Back: No CVA tenderness Extremities: No edema Neurologic: Grossly intact GU: urine dark red with some small clots.   Procedure: foley irrigation - I irrigated the foley and got back about 60 cc of clot. I initially had equal return then it stopped irrigating. I tried to move the catheter and it did not feel mobile. I deflated the balloon, advanced and got urine return. I infalted the balloon and seated it at the bladder neck. Catheter now irrigated normally and quickly cleared with no further clots. Foley left to gravity drain. I got equal return and on final exam abd soft, NT. SP area not distended or tender.    Laboratory Data:  Results for orders placed during the hospital encounter of 11/20/12 (from the past 24 hour(s))  CBC WITH DIFFERENTIAL     Status: Abnormal   Collection Time    11/21/12  3:30 AM      Result Value Range   WBC 9.9  4.0 - 10.5 K/uL   RBC 3.67 (*) 4.22 - 5.81 MIL/uL   Hemoglobin 11.5 (*) 13.0 - 17.0 g/dL   HCT 16.1 (*) 09.6 - 04.5 %   MCV 90.2  78.0 - 100.0 fL   MCH 31.3  26.0 - 34.0 pg   MCHC 34.7  30.0 - 36.0 g/dL   RDW 40.9  81.1 - 91.4 %   Platelets 180  150 - 400 K/uL   Neutrophils Relative % 82 (*) 43 - 77 %   Neutro Abs 8.1 (*) 1.7 - 7.7 K/uL   Lymphocytes Relative 9 (*) 12 - 46 %   Lymphs Abs 0.9  0.7 - 4.0 K/uL   Monocytes Relative 9  3 - 12 %    Monocytes Absolute 0.9  0.1 - 1.0 K/uL   Eosinophils  Relative 0  0 - 5 %   Eosinophils Absolute 0.0  0.0 - 0.7 K/uL   Basophils Relative 0  0 - 1 %   Basophils Absolute 0.0  0.0 - 0.1 K/uL   No results found for this or any previous visit (from the past 240 hour(s)). Creatinine: No results found for this basename: CREATININE,  in the last 168 hours  Impression/Assessment/Plan: -Gross hematuria - bleeding seems to have subsided or is very light. Send CBC, BMP, PT/PTT. Regular diet.  -BPH poss PCa - continue foley -CAD, COPD - will have hospitalist assess and monitor - seems to be at baseline.   Antony Haste 11/21/2012, 8:28 AM

## 2012-11-21 NOTE — Progress Notes (Signed)
Patient was leaking around catheter.  Catheter irrigated, with numerous small blood clots.  Philomena Doheny RN

## 2012-11-22 DIAGNOSIS — J9610 Chronic respiratory failure, unspecified whether with hypoxia or hypercapnia: Secondary | ICD-10-CM | POA: Diagnosis not present

## 2012-11-22 DIAGNOSIS — N138 Other obstructive and reflux uropathy: Secondary | ICD-10-CM | POA: Diagnosis not present

## 2012-11-22 DIAGNOSIS — R310 Gross hematuria: Secondary | ICD-10-CM | POA: Diagnosis not present

## 2012-11-22 DIAGNOSIS — N401 Enlarged prostate with lower urinary tract symptoms: Secondary | ICD-10-CM | POA: Diagnosis not present

## 2012-11-22 DIAGNOSIS — J449 Chronic obstructive pulmonary disease, unspecified: Secondary | ICD-10-CM | POA: Diagnosis not present

## 2012-11-22 DIAGNOSIS — R972 Elevated prostate specific antigen [PSA]: Secondary | ICD-10-CM | POA: Diagnosis not present

## 2012-11-22 DIAGNOSIS — I2510 Atherosclerotic heart disease of native coronary artery without angina pectoris: Secondary | ICD-10-CM | POA: Diagnosis not present

## 2012-11-22 MED ORDER — TAMSULOSIN HCL 0.4 MG PO CAPS: 0.4000 mg | ORAL_CAPSULE | Freq: Every day | ORAL | Status: DC

## 2012-11-22 MED ORDER — ASPIRIN 325 MG PO TABS: 325.0000 mg | ORAL_TABLET | Freq: Every morning | ORAL | Status: DC

## 2012-11-22 NOTE — Progress Notes (Signed)
Patient discharged in stable medical condition.  Patient agreeable to discharge plan in place, and verbalized an understanding of all instructions.  Philomena Doheny RN

## 2012-11-22 NOTE — Progress Notes (Signed)
Patient ID: [name], male   DOB: 1929-03-24, 80 y.o.   MRN: [id number] [facility] PROGRESS NOTE  [name]:[id number] DOB: Oct 20, 1929 DOA: 11/20/2012 PCP:  Duane Lope, MD  Brief narrative: 79 yo h/o BPH, abnormal prostate exam and PSA 23. He underwent prostate biopsy 11/19/2012 to rule out advanced/high risk prostate cancer. He tolerated the procedure well but was a bit lightheaded when he stood up. He went home and presented to ED with hematuria and possible clot retention. [facility] consulted for shortness of breath.  Active Problems:  COPD - clinically stable and no wheezing on exam this AM - pt maintaining oxygen saturations at target range - continue home nebs and symbicort  CAD - S/P CABG: pt denies any chest pain, palpitations or dizziness  Vertigo - possibly BPPV, in view of his history - Vestibular eval with PT this AM, will follow up on results   Hypertension - controlled with reasonable inpatient  - continue home medical regimen with Metoprolol and Lasix    Anemia - stable with Hg and Hct within normal limits this AM  Gross hematuria - per primary team   Consultants:  [facility] is consulting team   Procedures/Studies:  prostate biopsy 11/19/2012  Antibiotics:  Ciprofloxacin 11/13  Code Status: Full Family Communication: Pt at bedside Disposition Plan: Home today per primary team   HPI/Subjective: No events overnight.   Objective: Filed Vitals:   11/21/12 2126 11/21/12 2130 11/22/12 0528 11/22/12 0739  BP: 109/60  120/71   Pulse: 80  76   Temp: 99 F (37.2 C)  97.6 F (36.4 C)   TempSrc: Oral  Oral   Resp: 18  18   Height:      Weight:      SpO2: 90% 95% 99% 100%    Intake/Output Summary (Last 24 hours) at 11/22/12 0934 Last data filed at 11/22/12 0528  Gross per 24 hour  Intake    480 ml  Output   4480 ml  Net  -4000 ml    Exam:   General:  Pt is alert, follows commands appropriately, not in acute  distress  Cardiovascular: Regular rate and rhythm, S1/S2, no murmurs, no rubs, no gallops  Respiratory: Clear to auscultation bilaterally, no wheezing, no crackles, no rhonchi  Abdomen: Soft, non tender, non distended, bowel sounds present, no guarding  Extremities: No edema, pulses DP and PT palpable bilaterally  Data Reviewed: Basic Metabolic Panel:  Recent Labs Lab 11/21/12 0912  NA 132*  K 3.8  CL 97  CO2 25  GLUCOSE 96  BUN 14  CREATININE 0.94  CALCIUM 9.3   CBC:  Recent Labs Lab 11/21/12 0330 11/21/12 0912  WBC 9.9 9.9  NEUTROABS 8.1*  --   HGB 11.5* 12.2*  HCT 33.1* 34.7*  MCV 90.2 90.4  PLT 180 188   Scheduled Meds: . atorvastatin  20 mg Oral QHS  . budesonide-formoterol  2 puff Inhalation BID  . ciprofloxacin  500 mg Oral BID  . famotidine  20 mg Oral Daily  . ipratropium  0.5 mg Nebulization QID  . metoprolol succinate  25 mg Oral Daily  . tamsulosin  0.4 mg Oral QPC supper   Continuous Infusions:    Debbora Presto, MD  [facility] Pager [phone]  If 7PM-7AM, please contact night-coverage [email or web address] Password [email or web address] 11/22/2012, 9:34 AM   LOS: 2 days

## 2012-11-22 NOTE — ED Provider Notes (Signed)
  This was a shared visit with a mid-level provided (NP or PA).  Throughout the patient's course I was available for consultation/collaboration.  This gentleman presents to family as prostate biopsy, with hematuria, decreased urine production, and concern for urinary retention.  After placement of Foley catheter, the patient had a brief period of urine production, but then again was unable to produce urine after irrigation.  The patient was admitted for further evaluation and management.     Gerhard Munch, MD 11/22/12 [phone]

## 2012-11-22 NOTE — Evaluation (Signed)
I have reviewed this note and agree with all findings. Kati Yurianna Tusing, PT, DPT Pager: [phone]   

## 2012-11-22 NOTE — Discharge Summary (Signed)
Physician Discharge Summary  Patient ID: [name] MRN: [id number] DOB/AGE: 1929/03/25 79 y.o.  Admit date: 11/20/2012 Discharge date: 11/22/2012  Admission Diagnoses: Gross hematuria Prostate hard area/nodule Elevated PSA BPH  Discharge Diagnoses:  Active Problems:   * No active hospital problems. * Prostate hard area/nodule Elevated PSA BPH  Discharged Condition: fair/stable  Hospital Course: Admit for gross hematuria following prostate biopsy Nov 20, 2012. Bleeding subsided. Urine clear this AM. Pt ambulating around room at baseline. Bladder filled with 150 ml and foley d/c'd by me. He will void prior to discharge. Home on Cipro, tamsulosin, prior home meds.  Consults: hospitalist  Significant Diagnostic Studies: none   Treatments: Foley catheter placement  Discharge Exam: Blood pressure 120/71, pulse 76, temperature 97.6 F (36.4 C), temperature source Oral, resp. rate 18, height 5\' 9"  (1.753 m), weight 65.091 kg (143 lb 8 oz), SpO2 100.00%. NAD Ambulating around room A&Ox3 Abd - soft, NT, ND Urine - clear - foley removed Ext - no calf pain or edema/swelling  Disposition: 01-Home or Self Care   Future Appointments Provider Department Dept Phone   11/26/2012 2:00 PM Berneice Heinrich, PT [facility] [phone]   11/29/2012 1:15 PM Berneice Heinrich, PT [facility] [phone]   12/02/2012 2:00 PM Berneice Heinrich, PT [facility] [phone]   12/03/2012 2:00 PM Berneice Heinrich, PT [facility] [phone]   12/03/2012 3:00 PM Waymon Budge, MD [facility] [phone]   05/05/2013 2:30 PM Huston Foley, MD [facility] [phone]   05/14/2013 1:00 PM [facility] [address] [phone]   05/14/2013 2:15 PM Chuck Hint, MD [facility] [phone]   06/06/2013 8:20 AM [facility] [phone]       Medication List         aspirin 325 MG tablet  Take 1 tablet (325 mg total) by mouth every morning.  Start taking on:  11/24/2012     atorvastatin 20 MG tablet  Commonly known as:  LIPITOR  Take 20 mg by mouth at bedtime.     budesonide-formoterol 160-4.5 MCG/ACT inhaler  Commonly known as:  SYMBICORT  Inhale 2 puffs into the lungs 2 (two) times daily.     ciprofloxacin 500 MG tablet  Commonly known as:  CIPRO  Take 500 mg by mouth 2 (two) times daily.     furosemide 20 MG tablet  Commonly known as:  LASIX  Take 1 tablet (20 mg total) by mouth daily as needed.     guaiFENesin 600 MG 12 hr tablet  Commonly known as:  MUCINEX  Take 600 mg by mouth 2 (two) times daily. As needed for chest congestion     ipratropium 0.02 % nebulizer solution  Commonly known as:  ATROVENT  INHALE 1 VIAL VIA NEBULIZER FOUR TIMES DAILY. DX 496     levalbuterol 1.25 MG/3ML nebulizer solution  Commonly known as:  XOPENEX  Take 1.25 mg by nebulization every 4 (four) hours as needed for wheezing or shortness of breath (((PLAN B))). Dx 496     loratadine 10 MG tablet  Commonly known as:  CLARITIN  Take 10 mg by mouth daily as needed for allergies.     metoprolol succinate 25 MG 24 hr tablet  Commonly known as:  TOPROL-XL  Take 1 tablet (25 mg total) by mouth daily. 1 tab daily  ranitidine 150 MG tablet  Commonly known as:  ZANTAC  Take 150 mg by mouth 2 (two) times daily.     tamsulosin 0.4 MG Caps capsule  Commonly known as:  FLOMAX  Take 1 capsule (0.4 mg total) by mouth daily after supper.     Vitamin D3 1000 UNITS Caps  Take 1 capsule (1,000 Units total) by mouth daily.     XOPENEX HFA 45 MCG/ACT inhaler  Generic drug:  levalbuterol  Inhale 2 puffs into the lungs every 4 (four) hours as needed for wheezing or shortness of  breath (((PLAN A))).           Follow-up Information   Follow up with Mena Goes Lowella Petties, MD In 1 week.   Specialty:  Urology   Contact information:   [address] [place] [address] [phone]       Signed: Antony Haste 11/22/2012, 8:43 AM

## 2012-11-22 NOTE — Evaluation (Signed)
Physical Therapy One Time Evaluation Patient Details Name: [name] MRN: [id number] DOB: May 02, 1929 Today's Date: 11/22/2012 Time: 4696-2952 PT Time Calculation (min): 29 min  PT Assessment / Plan / Recommendation History of Present Illness  Pt is an 79 y.o. male with a PMH of CAD s/p CABG, COPD, HTN, HLD, chronic respiratory failure, and PVD who presents to the ED for evaluation of hematuria.  History was provided by the patient and his wife.  Patient had a prostate biopsy this afternoon at 4:00 PM with Dr. Mena Goes.  He states he had some difficulty urinating at the office before discharge, but felt well enough to go home.  He states that he has not been able to urinate since 6:00 PM this evening.  He has had a few episodes of gross hematuria with small blood clots present.  He complains of bladder pressure and burning with urination.  He denies any abdominal pain, nausea, emesis, diarrhea, or constipation. He denies any chest pain or change in SOB (at baseline).  He is on home O2 as needed for COPD.  He states he is very anxious about his inability to urinate and significant urge.  Pt with history of dizziness upon ambulation, evaluate for BPPV.  Clinical Impression  Pt admitted for above. Pt evaluated by Physical Therapy with no further acute PT needs identified. All education has been completed and the pt has no further questions. Pt reports "disconnected" and lightheaded feeling during all movements, with difficulty focusing noted during saccades, smooth pursuits, head shake, and head thrust, however, no nystagmus observed.  Pt reports he has been having symptoms for the last 5 months, with no reports of falls or injury.  Pt reports he does not feel as though the room is spinning during "disconnected" feeling and that symptoms occur during head movements, especially during ambulation and the symptoms cease once he stops moving his head. No nystagmus noted during Dix-Hallpike or horizontal canal  testing with pt reporting "disconnected" feeling during all tests.  Vertebral artery insufficiency test negative bil.  PT educated on moving eyes first, then head, then body during all mobility to decrease symptoms, with pt reporting improvement in symptoms.  PT recommends OPPT-neuro for vestibular eval, pt seems agreeable and reports he has been to neurologist which sent him to [facility] 2 weeks for vestibular eval.  See below for PT follow-up. PT is signing off. Thank you for this referral.    PT Assessment  All further PT needs can be met in the next venue of care    Follow Up Recommendations  Outpatient PT (OPPT-neurorehab for vestibular eval)    Does the patient have the potential to tolerate intense rehabilitation      Barriers to Discharge        Equipment Recommendations  None recommended by PT    Recommendations for Other Services     Frequency      Precautions / Restrictions Precautions Precautions: Fall Restrictions Weight Bearing Restrictions: No   Pertinent Vitals/Pain No c/o pain/SOB during session.      Mobility  Bed Mobility Bed Mobility: Rolling Right;Right Sidelying to Sit;Sit to Supine;Sitting - Scoot to [place] of Bed Rolling Right: 5: Supervision Right Sidelying to Sit: 5: Supervision Sitting - Scoot to Edge of Bed: 6: Modified independent (Device/Increase time) Sit to Supine: 6: Modified independent (Device/Increase time) Details for Bed Mobility Assistance: supervision to ensure safety during rolling R and sitting up due to reported symptoms of "disconnected" and dizziness after BPPV tests.  MOD I for sit to supine as pt demonstrates safe technique with increased time. Transfers Transfers: Sit to Stand;Stand to Sit Sit to Stand: 5: Supervision;From bed;With upper extremity assist Stand to Sit: 5: Supervision;To bed;With upper extremity assist Details for Transfer Assistance: supervision to ensure safety. Ambulation/Gait Ambulation/Gait Assistance:  4: Min guard Ambulation Distance (Feet): 100 Feet Assistive device: None Ambulation/Gait Assistance Details: min guard to ensure safety as pt reports lightheadedness and "disconnected" feeling during ambulation, with increase symptoms with head turns.  pt required standing rest break halfway through ambulation due to symptoms and fatigue. Gait Pattern: Step-through pattern;Decreased trunk rotation Gait velocity: decreased General Gait Details: PT educated pt on moving eyes first in direction he is moving, then head and then body to decrease symptoms during all mobility.     Exercises     PT Diagnosis: Difficulty walking;Other (comment) (vertigo)  PT Problem List: Decreased activity tolerance;Decreased mobility;Other (comment) (vertigo with head movements) PT Treatment Interventions:       PT Goals(Current goals can be found in the care plan section) Acute Rehab PT Goals PT Goal Formulation: No goals set, d/c therapy  Visit Information  Last PT Received On: 11/22/12 Assistance Needed: +1 History of Present Illness: Pt is an 79 y.o. male with a PMH of CAD s/p CABG, COPD, HTN, HLD, chronic respiratory failure, and PVD who presents to the ED for evaluation of hematuria.  History was provided by the patient and his wife.  Patient had a prostate biopsy this afternoon at 4:00 PM with Dr. Mena Goes.  He states he had some difficulty urinating at the office before discharge, but felt well enough to go home.  He states that he has not been able to urinate since 6:00 PM this evening.  He has had a few episodes of gross hematuria with small blood clots present.  He complains of bladder pressure and burning with urination.  He denies any abdominal pain, nausea, emesis, diarrhea, or constipation. He denies any chest pain or change in SOB (at baseline).  He is on home O2 as needed for COPD.  He states he is very anxious about his inability to urinate and significant urge.  Pt with history of dizziness upon  ambulation, evaluate for BPPV.       Prior Functioning  Home Living Family/patient expects to be discharged to:: Private residence Living Arrangements: Spouse/significant other Available Help at Discharge: Family;Available 24 hours/day Type of Home: House Home Access: Stairs to enter [employer] of Steps: "a few" Home Layout: Multi-level Alternate Level Stairs-Number of Steps: 12 Alternate Level Stairs-Rails: Right Home Equipment: Cane - single point Prior Function Level of Independence: Independent Comments: pt reports he is independent with all ADLs and ambulation. Communication Communication: No difficulties    Cognition  Cognition Arousal/Alertness: Awake/alert Behavior During Therapy: WFL for tasks assessed/performed Overall Cognitive Status: Within Functional Limits for tasks assessed    Extremity/Trunk Assessment Lower Extremity Assessment Lower Extremity Assessment: Overall WFL for tasks assessed   Balance Balance Balance Assessed: Yes Static Standing Balance Static Standing - Balance Support: No upper extremity supported Static Standing - Comment/# of Minutes: pt able to perform SLS with min guard to ensure safety, 10 seconds on each LE until PT told pt to stop with no LOB episodes. Single Leg Stance - Right Leg: 10 Single Leg Stance - Left Leg: 10  End of Session PT - End of Session Equipment Utilized During Treatment: Oxygen Activity Tolerance: Other (comment) (limited by "disconnected" feeling and lightheadedness during mobility.)  Patient left: in chair;with call bell/phone within reach  GP Functional Assessment Tool Used: clinical judgement, dix hallpike testing Functional Limitation: Mobility: Walking and moving around Mobility: Walking and Moving Around Current Status (W0981): At least 1 percent but less than 20 percent impaired, limited or restricted Mobility: Walking and Moving Around Goal Status [phone]): At least 1 percent but less than 20  percent impaired, limited or restricted Mobility: Walking and Moving Around Discharge Status [phone]): At least 1 percent but less than 20 percent impaired, limited or restricted   Sol Blazing 11/22/2012, 10:48 AM

## 2012-11-26 ENCOUNTER — Ambulatory Visit: Payer: Medicare Other | Admitting: Rehabilitative and Restorative Service Providers"

## 2012-11-29 ENCOUNTER — Ambulatory Visit: Payer: Medicare Other | Admitting: Rehabilitative and Restorative Service Providers"

## 2012-12-02 ENCOUNTER — Ambulatory Visit: Payer: Medicare Other | Admitting: Rehabilitative and Restorative Service Providers"

## 2012-12-03 ENCOUNTER — Ambulatory Visit (INDEPENDENT_AMBULATORY_CARE_PROVIDER_SITE_OTHER): Payer: Medicare Other | Admitting: Internal Medicine

## 2012-12-03 ENCOUNTER — Other Ambulatory Visit: Payer: Medicare Other

## 2012-12-03 ENCOUNTER — Ambulatory Visit: Payer: Medicare Other | Admitting: Rehabilitative and Restorative Service Providers"

## 2012-12-03 ENCOUNTER — Encounter: Payer: Self-pay | Admitting: Internal Medicine

## 2012-12-03 VITALS — BP 132/68 | HR 78 | Ht 69.0 in | Wt 146.8 lb

## 2012-12-03 DIAGNOSIS — J310 Chronic rhinitis: Secondary | ICD-10-CM | POA: Diagnosis not present

## 2012-12-03 MED ORDER — MONTELUKAST SODIUM 10 MG PO TABS: 10.0000 mg | ORAL_TABLET | Freq: Every day | ORAL | Status: DC

## 2012-12-03 NOTE — Patient Instructions (Addendum)
Script for singulair- airway anti-inflammatory sent  Sample Dymista nasal spray   1-2 puffs each nostril once daily at bedtime  Place saline nasal gel in nose once or twice daily as needed  Order- lab- Allergy profile, Food IgE allergy profile

## 2012-12-03 NOTE — Progress Notes (Signed)
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 Dr Sherene Sires follows for COPD. PCP Dr Danella Maiers. Complains of chronic rhinitis and nasal congestion gradually worse over your but always present. Temporary help from nasal sprays but no lasting benefit from Nasacort, Nasonex, ipratropium or Astelin. Using daily Claritin. Feels constant running stuffy nose. Has used oxygen for 4 years. Symptoms never seasonal. Brother is similar. Has known nasal septal deviation. Past history of sinusitis but not recent. Has severe COPD, oxygen 2 L/Advanced. No history of ENT surgery.   Prior to Admission medications   Medication Sig Start Date End Date Taking? Authorizing Provider  aspirin 325 MG tablet Take 1 tablet (325 mg total) by mouth every morning. 11/24/12  Yes Antony Haste, MD  budesonide-formoterol [facility]) 160-4.5 MCG/ACT inhaler Inhale 2 puffs into the lungs 2 (two) times daily.   Yes Historical Provider, MD  Cholecalciferol (VITAMIN D3) 1000 UNITS CAPS Take 1 capsule (1,000 Units total) by mouth daily. 03/14/11  Yes Tammy S Parrett, NP  guaiFENesin (MUCINEX) 600 MG 12 hr tablet Take 600 mg by mouth 2 (two) times daily. As needed for chest congestion   Yes Historical Provider, MD  levalbuterol (XOPENEX HFA) 45 MCG/ACT inhaler Inhale 2 puffs into the lungs every 4 (four) hours as needed for wheezing or shortness of breath (((PLAN A))).   Yes Historical Provider, MD  levalbuterol (XOPENEX) 1.25 MG/3ML nebulizer solution Take 1.25 mg by nebulization every 4 (four) hours as needed for wheezing or shortness of breath (((PLAN B))). Dx 496 11/05/12 11/28/14 Yes Nyoka Cowden, MD  loratadine (CLARITIN) 10 MG tablet Take 10 mg by mouth daily as needed for allergies.   Yes Historical Provider, MD  metoprolol succinate (TOPROL-XL) 25 MG 24 hr tablet Take 1 tablet (25 mg total) by mouth daily. 1 tab daily 10/24/12  Yes Lyn Records III, MD  ranitidine  (ZANTAC) 150 MG tablet Take 150 mg by mouth 2 (two) times daily.  03/14/11  Yes Tammy S Parrett, NP  atorvastatin (LIPITOR) 20 MG tablet TAKE ONE TABLET BY MOUTH ONCE DAILY 12/10/12   Lyn Records III, MD  Azelastine-Fluticasone [facility]) 137-50 MCG/ACT SUSP Place 1-2 puffs into both nostrils at bedtime. 12/16/12   Waymon Budge, MD  finasteride (PROSCAR) 5 MG tablet Take 5 mg by mouth daily.    Historical Provider, MD  furosemide (LASIX) 20 MG tablet Take 1 tablet (20 mg total) by mouth daily as needed. 04/05/12 03/26/14  Tammy S Parrett, NP  montelukast (SINGULAIR) 10 MG tablet Take 1 tablet (10 mg total) by mouth at bedtime. 12/03/12   Waymon Budge, MD   HPI- Charted and reviewed Past Medical History  Diagnosis Date  . Hypertension   . S/P CABG x 4   . CAD (coronary artery disease)   . COPD (chronic obstructive pulmonary disease)   . Hyperlipidemia   . Chronic rhinitis   . Chronic respiratory failure   . Peripheral vascular disease   . Upper respiratory infection 08/2012   Past Surgical History  Procedure Laterality Date  . Coronary artery bypass graft  2003  . Cataract extraction  11-20-11   Family History  Problem Relation Age of Onset  . Heart disease Mother   . Breast cancer Mother   . Cancer Mother     Breast cancer  . Hypertension Mother   . Other Mother     AAA  . Heart disease Father   .  Heart attack Father   . Heart disease Brother   . Hyperlipidemia Brother   . Hypertension Brother   . Heart disease Brother   . Stroke Brother     x3, still living   . Peripheral vascular disease Brother   . Allergies Brother    History   Social History  . Marital Status: Married    Spouse Name: N/A    Number of Children: 2  . Years of Education: N/A   Occupational History  . 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 Smoking status: Former Smoker -- 2.00 packs/day for 33 years    Types: Cigarettes    Quit date:  01/09/1981  . Smokeless tobacco: Never Used  . Alcohol Use: Yes     Comment: 1.5-3 oz daily  . Drug Use: No  . Sexual Activity: Not on file   Other Topics Concern  . Not on file   Social History Narrative   Pt lives at home with his spouse.         ROS-see HPI Constitutional:   No-   weight loss, night sweats, fevers, chills, fatigue, lassitude. HEENT:   No-  headaches, difficulty swallowing, tooth/dental problems, sore throat,       + sneezing, itching, nasal congestion, post nasal drip,  CV:  No-   chest pain, orthopnea, PND, swelling in lower extremities, anasarca,  dizziness, palpitations Resp: + shortness of breath with exertion or at rest.              No-   productive cough,  No non-productive cough,  No- coughing up of blood.              No-   change in color of mucus.  No- wheezing.   Skin: No-   rash or lesions. GI:  No-   heartburn, indigestion, abdominal pain, nausea, vomiting, diarrhea,                 change in bowel habits, loss of appetite GU: No-   dysuria, change in color of urine, no urgency or frequency.  No- flank pain. MS:  No-   joint pain or swelling.  No- decreased range of motion.  No- back pain. Neuro-     nothing unusual Psych:  No- change in mood or affect. No depression or anxiety.  No memory loss.  OBJ- Physical Exam General- Alert, Oriented, Affect-appropriate, Distress- none acute, trim. O2 2L Skin- rash-none, lesions- none, excoriation- none Lymphadenopathy- none Head- atraumatic            Eyes- Gross vision intact, PERRLA, conjunctivae and secretions clear            Ears- Hearing, canals-normal            Nose- + Repeated sniffing,Clear, no-Septal dev, mucus, polyps, erosion, perforation             Throat- Mallampati II , mucosa atrophic/ pale , drainage- none, tonsils- atrophic Neck- flexible , trachea midline, no stridor , thyroid nl, carotid no bruit Chest - symmetrical excursion , unlabored           Heart/CV- RRR , no murmur , no  gallop  , no rub, nl s1 s2                           - JVD- none , edema- none, stasis changes- none, varices- none  Lung- clear to P&A, wheeze- none, cough- none , dullness-none, rub- none           Chest wall-  Abd- tender-no, distended-no, bowel sounds-present, HSM- no Br/ Gen/ Rectal- Not done, not indicated Extrem- cyanosis- none, clubbing, none, atrophy- none, strength- nl Neuro- grossly intact to observation                                 Assessment & Plan:

## 2012-12-04 LAB — ALLERGY FULL PROFILE
Allergen, D pternoyssinus,d7: <0.1 kU/L
Allergen,Goose feathers, e70: <0.1 kU/L
Alternaria Alternata: <0.1 kU/L
Aspergillus fumigatus, m3: <0.1 kU/L
Bahia Grass: <0.1 kU/L
Bermuda Grass: <0.1 kU/L
Box Elder IgE: <0.1 kU/L
Candida Albicans: <0.1 kU/L
Cat Dander: <0.1 kU/L
Common Ragweed: <0.1 kU/L
Curvularia lunata: <0.1 kU/L
D. farinae: <0.1 kU/L
Dog Dander: <0.1 kU/L
Elm IgE: <0.1 kU/L
Fescue: <0.1 kU/L
G005 Rye, Perennial: <0.1 kU/L
G009 Red Top: <0.1 kU/L
Goldenrod: <0.1 kU/L
Helminthosporium halodes: <0.1 kU/L
House Dust Hollister: <0.1 kU/L
Lamb's Quarters: <0.1 kU/L
Oak: <0.1 kU/L
Plantain: <0.1 kU/L
Stemphylium Botryosum: <0.1 kU/L
Sycamore Tree: <0.1 kU/L
Timothy Grass: <0.1 kU/L

## 2012-12-04 LAB — ALLERGEN FOOD PROFILE SPECIFIC IGE
Apple: <0.1 kU/L
Chicken IgE: <0.1 kU/L
Corn: <0.1 kU/L
Egg White IgE: <0.1 kU/L
Fish Cod: <0.1 kU/L
IgE (Immunoglobulin E), Serum: 94.8 IU/mL (ref 0.0–180.0)
Milk IgE: <0.1 kU/L
Orange: <0.1 kU/L
Peanut IgE: <0.1 kU/L
Shrimp IgE: <0.1 kU/L
Soybean IgE: <0.1 kU/L
Tomato IgE: <0.1 kU/L
Tuna IgE: <0.1 kU/L
Wheat IgE: <0.1 kU/L

## 2012-12-10 ENCOUNTER — Other Ambulatory Visit: Payer: Self-pay | Admitting: Interventional Cardiology

## 2012-12-11 ENCOUNTER — Ambulatory Visit: Payer: Medicare Other | Attending: Neurology | Admitting: Rehabilitative and Restorative Service Providers"

## 2012-12-11 DIAGNOSIS — IMO0001 Reserved for inherently not codable concepts without codable children: Secondary | ICD-10-CM | POA: Diagnosis not present

## 2012-12-11 DIAGNOSIS — J4489 Other specified chronic obstructive pulmonary disease: Secondary | ICD-10-CM | POA: Insufficient documentation

## 2012-12-11 DIAGNOSIS — R5381 Other malaise: Secondary | ICD-10-CM | POA: Insufficient documentation

## 2012-12-11 DIAGNOSIS — R269 Unspecified abnormalities of gait and mobility: Secondary | ICD-10-CM | POA: Insufficient documentation

## 2012-12-11 DIAGNOSIS — Z9981 Dependence on supplemental oxygen: Secondary | ICD-10-CM | POA: Insufficient documentation

## 2012-12-11 DIAGNOSIS — J449 Chronic obstructive pulmonary disease, unspecified: Secondary | ICD-10-CM | POA: Insufficient documentation

## 2012-12-14 ENCOUNTER — Other Ambulatory Visit: Payer: Self-pay | Admitting: Interventional Cardiology

## 2012-12-16 ENCOUNTER — Telehealth: Payer: Self-pay | Admitting: Internal Medicine

## 2012-12-16 MED ORDER — AZELASTINE-FLUTICASONE 137-50 MCG/ACT NA SUSP: 1.0000 | Freq: Every day | NASAL | Status: DC

## 2012-12-16 NOTE — Telephone Encounter (Signed)
Pt returned triage's call.  Holly D Pryor ° °

## 2012-12-16 NOTE — Telephone Encounter (Signed)
lmomtcb for pt 

## 2012-12-16 NOTE — Telephone Encounter (Signed)
Called, spoke with pt -  reports Dymista worked well for him.  He would like rx sent to [facility] Rx has been sent -- pt aware.  (ok'ed per Florentina Addison).

## 2012-12-18 ENCOUNTER — Ambulatory Visit (INDEPENDENT_AMBULATORY_CARE_PROVIDER_SITE_OTHER): Payer: Medicare Other | Admitting: Internal Medicine

## 2012-12-18 ENCOUNTER — Encounter: Payer: Self-pay | Admitting: Internal Medicine

## 2012-12-18 VITALS — BP 150/80 | HR 79 | Temp 98.0°F | Ht 69.0 in | Wt 145.4 lb

## 2012-12-18 DIAGNOSIS — J4489 Other specified chronic obstructive pulmonary disease: Secondary | ICD-10-CM

## 2012-12-18 DIAGNOSIS — J449 Chronic obstructive pulmonary disease, unspecified: Secondary | ICD-10-CM

## 2012-12-18 MED ORDER — ACLIDINIUM BROMIDE 400 MCG/ACT IN AEPB: 1.0000 | INHALATION_SPRAY | Freq: Two times a day (BID) | RESPIRATORY_TRACT | Status: DC

## 2012-12-18 NOTE — Progress Notes (Signed)
Spoke with pt and notified of recs per MW He verbalized understanding and rx was sent to pharm  Subjective:    Patient ID: [name], male    DOB: December 18, 1929   MRN: [id number]     Summary: Pulmonary/ f/u ov try astepro for pnds   Primary Provider/Referring Provider: [name]    Brief patient profile:  83   yowm last smoked 1983 severe COPD FEV1 of only 36% April 2007 with a 29% improvement after bronchodilators consistent with an asthmatic component. Baseline activity level--walk 1 mile daily with oxygen on. Maintained on Symbicort 160/4.84mcg 1 puff two times a day.    History of Present Illness  February 11, 2008 ov never took spiriva consistently. No variability to doe or sign cough. rec spiriva but w/in one week cp > [facility] with neg cardiac wu, still having occ cp , positional, better with rubbing chest and relaxing.  Retry spiriva   October 15, 2008 rov follow up, about the same did not get along well with spiriva , discont. taking caused dry mouth. still definitely benefits from as needed xopenex.    February 18, 2010 -- ov cc cough with green/white mucus, wheezing, SOB x5days. OTC not helping. Cough is worse at night, keeping him up at times. Wheezing started yesterday. rex doxy, prednisone then admit 2/14 to [facility] and d/c 2/17 with ct c/w emphysema > d/c on zmax and prednisone tapered off as of March 03, 2010   March 03, 2010 ov cc  minimally improved sob and cough with green sputum and nasal congestion esp in am .. doex > slow adls  minimally better p nebs rec  See Patient Care Coordinator before leaving for sinus CT  Thickening only  Prilosec otc 20mg  30 min before bfast and zantac 150 at bedtime as long as coughing ( reflux is to cough what oxygen is to fire)  Augmentin 875 mg twice daily with water and eat yogurt for lunch  Prednisone 10mg  2 daily back to normal then 1 daily until return.   March 17, 2010 ov Cough and Dyspnea- some better, mucus is now white and less volume  worse lying down, less need for rescue.  rec taper off prednisone >  5 days off  04/28/2010 ov/[name] no worse off prednisone,  Rare astepro, no need for rescue or even using the atrovent/xop more than twice dailly - overall much better.   rec Ipatropium / xopenex are twice regulary with ability to increase to 4 x daily if breathing worsens   10/14/2010 f/u ov/Kalib Bhagat cc Pt states breathing worse with exertion x 1 month. Still has no cough but lots of mucus esp in am with  No purulent sputum. ent wants to try ipatropium and scopalamine to dry up his drippy nose - no longer following med calendar for action plan at bottom rec Change ipatropium to 4 x daily and just use Only use your xopenex as a rescue medication to be used if you can't catch your breath by resting or doing a relaxed purse lip breathing pattern. The less you use it, the better it will work when you need it.   Prednisone 10 mg take  4 each am x 2 days,   2 each am x 2 days,  1 each am x2days and stop   Please schedule a follow up office visit in 6 weeks, call sooner if needed but bring the medication calendar with you   11/25/2010 f/u ov/Timea Breed cc breathing better, nasal drainage better.minimal  cough. Doe at [place].minimal use of rescue   rec Follow med calendar  03/10/2011 Follow up and Med review  Returns for follow up and med review. We reviewed his meds and organized them into a med calendar with pt educaiton.   rec  05/01/2011 f/u ov/[name] cc 50 ft outside, one aisle at [employer], whole store with 02. No cough and minimal need for saba  Seen at [facility] rec change ipatropium neb  to atrovent > no change doe. rec ry tudorza one puff twice daily and stop atrovent and see how it effects your activity performance, if not benefit ok to change back to atrovent   10/04/2011 f/u ov/[name] did not bring med calendar cc  Loosing ground on tudorza so went to atrovent neb and seemed to help. rec Plan A = use automatically no matter what Symbicort  followed by Atrovent each am and symbiocort again in 12 hours plus continue the atrovent though the day in between about every 4-6 hours  Only use your albuterol (xopenex hfa -puffer-  is Plan B,  Nebulizer is plan C) as a rescue medication to be used if you can't catch your breath by resting or doing a relaxed purse lip breathing pattern. The less you use it, the better it will work when you need it.      Late add: Struggling a bit with implementation of action plans, need to review med calendar next ov   12/21/2011 f/u ov/[name] cc doe loosing ground = one aisle slow off 02,  Does ok on 02 at grocery, but even on 02 on treadmill at usual grade (flat) and speed (1.4 mph) on 02 x 20 min where used to to 1.5 x 30 min.  qnasal helped nasal discharge but increased bp so stopped it and drainage came back. rec Restart qnasal and monitor your blood pressure  Always wear 02 2lpm at bedtime and when walking more than room to room at home    01/29/2012 f/u ov/[name] cc slt better doe, fought off cold ok x sev weeks prior to OV  and back to rare if ever use xopenex  maint complaint is persistent x years watery rhinitis worse with cold air exp and with eating, no better on qnasal.     No obvious daytime variabilty or assoc chronic cough or cp or chest tightness, subjective wheeze overt sinus or hb symptoms. No unusual exp hx  >chlortrimeton As needed  > did not try   04/05/2012 Acute OV  Complains of  increased SOB, some wheezing and tightness in chest x3 weeks. Patient denies any discolored mucus, or fever, chest pain, orthopnea. Has had increased lower extremity swelling. Has been worse over the last few weeks. Did try Lasix 20 mg daily, but did not see much improvement. Patient denies any calf pain, hemoptysis, or known injury. Has had quite a bit of nasal congestion, and drainage. He denies sinus pain or pressure. rec Lasix 20mg   2 tabs daily for 3 days then As needed  For leg swelling.       05/22/2012 f/u ov/[name] copd/ resp failure on 02 2lpm sleeping and with activity Chief Complaint  Patient presents with  . Follow-up    Breathing improved since the last visit, not back at his baseline yet. No new co's today.   Sense of pnds worst in am never expectorates, better for only 5 min p "does his trick" to clear his throat. Rare use of xopenex hfa daytime, never needs neb saba rec  Take zantac 150 mg and chlortrimeton 4 mg at bedtime  To see what effect if any it has on your am congestion in your throat and nose   09/30/2012 f/u ov/[name] re: copd/02 dep  Chief Complaint  Patient presents with  . Follow-up    Pt states breathing worse for the past 5 wks. He states that he gets SOB and dizzy with minimal exertion.    dizzy ? Since started Zmax at rec of [facility]. Doe x 100 ft even on 02, less cough on rx Confused with action plan meds even though read him med caledar back to me correctly   >>pred taper   10/15/2012 Follow up and Med review  Patient returns for a followup and medication review. Reviewed all his medications and organized them into a medication calendar with patient education. Appears the patient is taking his medications correctly. Patient requests a referral to allergist. Today. He complains of persistent nasal congestion, and drainage. We discussed using saline nasal rinses and saline gel to help with his nasal dryness. Due to oxygen. Use. Patient denies any hemoptysis, orthopnea, PND, or leg swelling. Last visit. Patient was having a flare. COPD, was treated with a prednisone taper, which is now resolved. Patient's breathing is back to baseline. rec Prednisone 10 mg take  4 each am x 2 days,   2 each am x 2 days,  1 each am x 2 days and stop   Stop zithromax  Plan A symbiocort and Ipatropium  Plan B = backup > xopenex 2 every 4 if needed  Plan C = xopenex neb every 4 hours if needed   12/18/2012 f/u ov/[name] re: copd/ 02  at bedtime and with  exertion  Chief Complaint  Patient presents with  . Follow-up    Increased sob at all times x1 month.  using O2 more frequently   actually no sob at rest or at hs, wears 02 with activity and when he does wear it the sats are fine even when he has to stop due to sob  No obvious day to day or daytime variabilty or assoc chronic cough or cp or chest tightness, subjective wheeze overt sinus or hb symptoms. No unusual exp hx or h/o childhood pna/ asthma or knowledge of premature birth.  Sleeping ok without nocturnal  or early am exacerbation  of respiratory  c/o's or need for noct saba. Also denies any obvious fluctuation of symptoms with weather or environmental changes or other aggravating or alleviating factors except as outlined above   Current Medications, Allergies, Complete Past Medical History, Past Surgical History, Family History, and Social History were reviewed in [employer] record.  ROS  The following are not active complaints unless bolded sore throat, dysphagia, dental problems, itching, sneezing,  nasal congestion or excess/ purulent secretions, ear ache,   fever, chills, sweats, unintended wt loss, pleuritic or exertional cp, hemoptysis,  orthopnea pnd or leg swelling, presyncope, palpitations, heartburn, abdominal pain, anorexia, nausea, vomiting, diarrhea  or change in bowel or urinary habits, change in stools or urine, dysuria,hematuria,  rash, arthralgias, visual complaints, headache, numbness weakness or ataxia or problems with walking or coordination,  change in mood/affect or memory.                Past Medical History:  CHRONIC RHINITIS (ICD-472.0)  - Sinus ct March 03, 2010 > thickening only, no acute changes  CHRONIC RESPIRATORY FAILURE (ICD-518.83)  - 02 rx since 2008  COPD UNSPECIFIED (ICD-496)....................................................................[name]  -  PFTs 05/04/05 FEV1 36% ratio 28% with 29% improvement after  bronchodilators DLCO 48%  - PFT's 03/26/08 30% ratio 34 with 14% improvement after bronchodilators DLC0 38 %  - Start noct 02 at 2lpm 2008 and on 03/04/09 desat @ > 185 ft so rec wear with activtiy > rm to rm  - [facility] June 30, 2008 100% > confirmed March 04, 2009  ISCHEMIC HEART DISEASE (ICD-414.9)  HYPERTENSION (ICD-401.9)  HYPERLIPIDEMIA (ICD-272.4)  SPN RUL by cxr August 19, 2009  COMPLEX MED REGIMEN-Meds reviewed with pt education and computerized med calendar 11/22/09 03/10/2011  , 10/15/2012                           Objective:   Physical Exam  amb wm nad with nl vitals  wt 159 04/28/2010  > 10/14/2010 154  > 10/04/2011 150> 12/21/2011  150 > 149 01/29/2012 >154 04/05/2012 > 05/22/2012 148 > 09/30/2012  146 >>144 10/15/2012 > 12/18/2012  145   HEENT mild turbinate edema.  Oropharynx no thrush or excess pnd or cobblestoning.  No JVD or cervical adenopathy. Mild accessory muscle hypertrophy. Trachea midline, nl thryroid. Chest was hyperinflated by percussion with diminished breath sounds and moderate increased exp time without wheeze. Hoover sign positive at mid inspiration. Regular rate and rhythm without murmur gallop or rub or increase P2 or edema.  Abd: no hsm, nl excursion. Ext warm without cyanosis or clubbing.       04/08/12 cxr  No active disease. No significant change. Mild hyperinflation.    Assessment & Plan:

## 2012-12-18 NOTE — Patient Instructions (Addendum)
Plan A= automatic  symbiocort and tudorza twice daily and ok to use your 02 more consistently with activity that you know makes you short of breath  Plan B = backup > xopenex 2 every 4 if needed if can't catch your breath  Plan C = xopenex neb every 4 hours if plan B not working, always try plan B first   Please see patient coordinator before you leave today  to schedule rehab  See Tammy NP  In 4  weeks with all your medications, even over the counter meds, separated in two separate bags, the ones you take no matter what vs the ones you stop once you feel better and take only as needed when you feel you need them.   Tammy  will generate for you a new user friendly medication calendar that will put [place] all on the same page re: your medication use.     Without this process, it simply isn't possible to assure that we are providing  your outpatient care  with  the attention to detail we feel you deserve.   If we cannot assure that you're getting that kind of care,  then we cannot manage your problem effectively from this clinic.  Once you have seen Tammy and we are sure that we're all on the same page with your medication use she will arrange follow up with me.

## 2012-12-19 NOTE — Assessment & Plan Note (Addendum)
-   PFTs 05/04/05 FEV1 36% ratio 28% with 29% improvement after bronchodilators DLCO 48%  - PFT's 03/26/08 30% ratio 34 with 14% improvement after bronchodilators DLC0 38 %  - Start noct 02 at 2lpm 2008 and on 03/04/09 desat @ > 185 ft so rec wear with activtiy > rm to rm   - Trial of tudorza 05/02/2011 > no better, changed back to atrovent neb  > rechallenge  The proper method of use, as well as anticipated side effects, of a dry powder inhaler are discussed and demonstrated to the patient. Improved effectiveness after extensive coaching during this visit to a level of approximately  90%   Needs rehab> referred   Next step is med reconciliation >  To keep things simple, I have asked the patient to first separate medicines that are perceived as maintenance, that is to be taken daily "no matter what", from those medicines that are taken on only on an as-needed basis and I have given the patient examples of both, and then return to see our NP to generate a  detailed  medication calendar which should be followed until the next physician sees the patient and updates it.

## 2012-12-20 ENCOUNTER — Telehealth: Payer: Self-pay | Admitting: Internal Medicine

## 2012-12-20 NOTE — Telephone Encounter (Signed)
Ok to dc and just use atrovent in neb again - when returns we'll regroup

## 2012-12-20 NOTE — Telephone Encounter (Signed)
I called and spoke with pt. He reports he started the [place]. He feels like it makes him feel dizzy, nervous, slight body discomort, and perspire more. Please advise MW thanks  Allergies  Allergen Reactions  . Cefdinir     REACTION: diarrhea  . Sulfonamide Derivatives     REACTION: facial/tongue swelling

## 2012-12-20 NOTE — Telephone Encounter (Signed)
Pt is aware. Nothing further needed 

## 2012-12-21 NOTE — Assessment & Plan Note (Signed)
This is probably atrophic rhinitis, nonallergic, and probably aggravated by his nasal oxygen. He wants to know from me, whether there is an allergy component. We discussed symptomatic management options. Plan-try Singulair plus sample Dymista. Lab for allergy profiles. Try nasal saline gel

## 2012-12-25 ENCOUNTER — Ambulatory Visit: Payer: Medicare Other | Admitting: Rehabilitative and Restorative Service Providers"

## 2012-12-26 ENCOUNTER — Encounter (HOSPITAL_COMMUNITY): Payer: Self-pay

## 2012-12-26 ENCOUNTER — Encounter (HOSPITAL_COMMUNITY)
Admission: RE | Admit: 2012-12-26 | Discharge: 2012-12-26 | Disposition: A | Discharge status: Home / Self Care | Payer: Medicare Other | Source: Ambulatory Visit | Attending: Internal Medicine | Admitting: Internal Medicine

## 2012-12-26 DIAGNOSIS — I10 Essential (primary) hypertension: Secondary | ICD-10-CM | POA: Insufficient documentation

## 2012-12-26 DIAGNOSIS — I6529 Occlusion and stenosis of unspecified carotid artery: Secondary | ICD-10-CM | POA: Insufficient documentation

## 2012-12-26 DIAGNOSIS — J9610 Chronic respiratory failure, unspecified whether with hypoxia or hypercapnia: Secondary | ICD-10-CM | POA: Insufficient documentation

## 2012-12-26 DIAGNOSIS — Z7982 Long term (current) use of aspirin: Secondary | ICD-10-CM | POA: Insufficient documentation

## 2012-12-26 DIAGNOSIS — Z951 Presence of aortocoronary bypass graft: Secondary | ICD-10-CM | POA: Insufficient documentation

## 2012-12-26 DIAGNOSIS — I509 Heart failure, unspecified: Secondary | ICD-10-CM | POA: Insufficient documentation

## 2012-12-26 DIAGNOSIS — J449 Chronic obstructive pulmonary disease, unspecified: Secondary | ICD-10-CM | POA: Insufficient documentation

## 2012-12-26 DIAGNOSIS — Z5189 Encounter for other specified aftercare: Secondary | ICD-10-CM | POA: Insufficient documentation

## 2012-12-26 DIAGNOSIS — I2510 Atherosclerotic heart disease of native coronary artery without angina pectoris: Secondary | ICD-10-CM | POA: Insufficient documentation

## 2012-12-26 DIAGNOSIS — J4489 Other specified chronic obstructive pulmonary disease: Secondary | ICD-10-CM | POA: Insufficient documentation

## 2012-12-26 DIAGNOSIS — I739 Peripheral vascular disease, unspecified: Secondary | ICD-10-CM | POA: Insufficient documentation

## 2012-12-26 DIAGNOSIS — J984 Other disorders of lung: Secondary | ICD-10-CM | POA: Insufficient documentation

## 2012-12-26 DIAGNOSIS — G4733 Obstructive sleep apnea (adult) (pediatric): Secondary | ICD-10-CM | POA: Insufficient documentation

## 2012-12-26 HISTORY — DX: Syncope and collapse: R55

## 2012-12-26 HISTORY — DX: Unspecified asthma, uncomplicated: J45.909

## 2012-12-26 NOTE — Progress Notes (Signed)
[name] arrived today for orientation to Pulmonary Rehab.  He was transported via wheelchair from [employer] entrance.  Well dressed pleasant gentleman using portable oxygen @ 2L /min.  Oriented x 3, color good, skin warm and dry.  Medications and medical history reviewed with patient.  Nurse assessment done.  Grips equal and strong, balance good.  He does have mild pitting edema in lower extremities, left greater than right.  Faint distal pulses.  He is followed by [facility] for this and also carotid arteries.  He is s/p cabg  And is followed by Dr. Katrinka Blazing.  He reports he has not had any chest pains since his surgery several years ago.  He still carries his NTG with him and keeps renewals current.  Demonstration and practice of PLB using pulse oximeter.  Patient able to return demonstration satisfactorily. Safety and hand hygiene in the exercise area reviewed with patient.  Patient voices understanding.  6 min walk test done, oxygen saturations stayed above 95% during test.  He plans to begin exercise on 12/30/12.  We look forward to working with this nice gentleman.

## 2012-12-26 NOTE — Progress Notes (Signed)
[name] 79 y.o. male  Initial Psychosocial Assessment  Pt psychosocial assessment reveals pt lives with his wife.. Pt is currently retired. Worked mostly as a Psychologist, occupational. Pt hobbies has been photography in the past and now he is organizing 14,000 pictures on his computer.  His stress level is moderate, mostly due to decreased ability to do exercise since illness in late spring and hospitalization after that for urinary and prostate problems. .  Pt does not exhibit  signs of depression. He does admit to feeling down occasionally because he knows his health is declining due to his 45 years.  His wife is very supportive and also a son and daughter that are supportive as well.  He is looking forward to attending Pulmonary Rehab in hopes of gaining strength and decreasing his shortness of breath.  He demonstrates good coping skills. Goal(s): Improved management of anxiety due to recognizing his increased age Improved coping skills Help patient work toward returning to meaningful activities that improve patient's QOL and are attainable with patient's lung disease   12/26/2012 4:02 PM

## 2012-12-31 ENCOUNTER — Other Ambulatory Visit: Payer: Self-pay

## 2012-12-31 MED ORDER — AZELASTINE HCL 0.1 % NA SOLN: 2.0000 | Freq: Every day | NASAL | Status: DC

## 2012-12-31 MED ORDER — FLUTICASONE PROPIONATE 50 MCG/ACT NA SUSP: 2.0000 | Freq: Every day | NASAL | Status: DC

## 2012-12-31 NOTE — Telephone Encounter (Signed)
[facility] pharmacy, will cb this afternoon.  Dymista was not covered for pt, per CY we are changing medications to Astelin #1 1-2 puffs each nostril at bedtime, and Flonase #1 1-2 puffs each nostril at bedtime, refill prn.

## 2012-12-31 NOTE — Telephone Encounter (Signed)
Called in meds to pharmacy, cancelled the dymista.  Spoke with pt, he is aware.  Nothing further needed at this time. [name]

## 2013-01-07 ENCOUNTER — Encounter (HOSPITAL_COMMUNITY)
Admission: RE | Admit: 2013-01-07 | Discharge: 2013-01-07 | Disposition: A | Discharge status: Home / Self Care | Payer: Medicare Other | Source: Ambulatory Visit | Attending: Internal Medicine | Admitting: Internal Medicine

## 2013-01-07 DIAGNOSIS — J9610 Chronic respiratory failure, unspecified whether with hypoxia or hypercapnia: Secondary | ICD-10-CM | POA: Diagnosis not present

## 2013-01-07 DIAGNOSIS — Z7982 Long term (current) use of aspirin: Secondary | ICD-10-CM | POA: Diagnosis not present

## 2013-01-07 DIAGNOSIS — Z5189 Encounter for other specified aftercare: Secondary | ICD-10-CM | POA: Diagnosis not present

## 2013-01-07 DIAGNOSIS — I509 Heart failure, unspecified: Secondary | ICD-10-CM | POA: Diagnosis not present

## 2013-01-07 DIAGNOSIS — Z951 Presence of aortocoronary bypass graft: Secondary | ICD-10-CM | POA: Diagnosis not present

## 2013-01-07 DIAGNOSIS — I10 Essential (primary) hypertension: Secondary | ICD-10-CM | POA: Diagnosis not present

## 2013-01-07 DIAGNOSIS — I2510 Atherosclerotic heart disease of native coronary artery without angina pectoris: Secondary | ICD-10-CM | POA: Diagnosis not present

## 2013-01-07 DIAGNOSIS — J984 Other disorders of lung: Secondary | ICD-10-CM | POA: Diagnosis not present

## 2013-01-07 DIAGNOSIS — I6529 Occlusion and stenosis of unspecified carotid artery: Secondary | ICD-10-CM | POA: Diagnosis not present

## 2013-01-07 DIAGNOSIS — J449 Chronic obstructive pulmonary disease, unspecified: Secondary | ICD-10-CM | POA: Diagnosis not present

## 2013-01-07 DIAGNOSIS — G4733 Obstructive sleep apnea (adult) (pediatric): Secondary | ICD-10-CM | POA: Diagnosis not present

## 2013-01-07 DIAGNOSIS — I739 Peripheral vascular disease, unspecified: Secondary | ICD-10-CM | POA: Diagnosis not present

## 2013-01-13 ENCOUNTER — Encounter (HOSPITAL_COMMUNITY): Payer: Self-pay

## 2013-01-13 NOTE — Progress Notes (Signed)
[facility] [place] Pulmonary Rehabilitation Baseline Outcomes Assessment   Anthropometrics:    Height (inches): 69   Weight (kg): 65.4   Grip strength was measured using a Dynamometer.  The patient's highest score was a 34.  Functional Status/Exercise Capacity:   [name] had a resting heart rate of 75 BPM, a resting blood pressure of 140/64, and an oxygen saturation of 100 % on 2 liters of O2.  [name] performed a 6-minute walk test on 12/26/12.  The patient completed 772 feet in 6 minutes with 0 rest breaks.  This quantifies 2.15 METS.   Dyspnea Measures:   The [facility] is a simple and standardized method of classifying disability in patients with COPD.  The assessment correlates disability and dyspnea.  At entrance the patient scored a 2. The scale is provided below.   0= I only get breathless with strenuous exercise. 1= I get short of breath when hurrying on level ground or walking up a slight incline. 2= On level ground, I walk slower than people of the same age because of breathlessness, or have to stop for breath when walking at my own pace. 3= I stop for breath after walking 100 yards or after a few minutes on level ground. 4=I am too breathless to leave the house or I am breathless when dressing.     The patient completed the [place] ([facility] [place]).  This questionnaire relates activities of daily living and shortness of breath.  The score ranges from 0-120, a higher score relates to severe shortness of breath during activities of daily living. The patient's score at entrance was 64.  Quality of Life:   [name] and Powers Quality of Life Index Pulmonary Version is used to assess the patients satisfaction in different domains of their life; health and functioning, socioeconomic, psychological/spiritual, and family. The overall score is recorded out of 30 points.  The patient's goal is to achieve an overall score of 21 or  higher.  [name] received a 18.36 at entrance.    The Patient Health Questionnaire (PHQ-2) is a first step approach for the screening of depression.  If the patient scores positive on the PHQ-2 the patient should be further assessed with the PHQ-9.  The Patient Health Questionnaire (PHQ-9) assesses the degree of depression.  Depression is important to monitor and track in pulmonary patients due to its prevalence in the population.  If the patient advances to the PHQ-9 the goal is to score less than 4 on this assessment.  [name] scored a 2 on the PHQ-2 and a 5 on the PHQ-9 at entrance.  Clinical Assessment Tools:   The COPD Assessment Test (CAT) is a measurement tool to quantify how much of an impact the disease has on the patient's life.  This assessment aids the Pulmonary Rehab Team in designing the patients individualized treatment plan.  A CAT score ranges from 0-40.  A score of 10 or below indicates that COPD has a low impact on the patient's life whereas a score of 30 or higher indicates a severe impact. The patient's goal is a decrease of 1 point from entrance to discharge.  [name] had a CAT score of 23 at entrance.  Nutrition:   The "Rate My Plate" is a dietary assessment that quantifies the balance of a patient's diet.  This tool allows the Pulmonary Rehab Team to key in on the areas of the patient's diet that needs improving.  The team  can then focus their nutritional education on those areas.  If the patient scores 24-40, this means there are many ways they can make their eating habits healthier, 41-57 states that there are some ways they can make their eating habits healthier and a score of 58-72 states that they are making many healthy choices.  The patient's goal is to achieve a score of 49 or higher on this assessment.  [name] scored a 16 at entrance.  Oxygen Compliance:   Patient is currently on 2 liters at rest, 2 liters at night, and 2 liters for exercise.  [name] is not currently cpap/bipap at  night.  The patient is currently compliant with oxygen use. The patient states that they do not have barriers that keep them from using their oxygen.    Education:   [name] will attend education classes during the course of Pulmonary Rehab.  Education classes that will be offered to the patient are Activities of Daily Living and Energy Conservation, Pursed Lip Breathing and Diaphragmatic Breathing, Nutrition, Exercise for the Pulmonary Patient, Warning Signs of Infection, Chronic Lung Disease, Advanced Directives, Medications, and Stress and Meditation.  The patient completed an assessment at the entrance of the program and will complete it again upon discharge to demonstrate the level of understanding provided by the educational classes.  This assessment includes 14 questions regarding all of the education topics above.  [name] achieved a score of 12/14 at entrance.  Smoking Cessation:  The patient is not currently smoking.  Exercise:   [name] will be provided with an individualized Home Exercise Prescription (HEP) at the entrance of the program.  The patient will be followed by the Pulmonary Exercise Physiologist throughout the program to assist with the progression of the frequency, intensity, time, and type of exercise. The patient's long-term goal is to be exercising 30-60 minutes, 3-5 days per week. At entrance, the patient was exercising 3 days a week for 15 minutes at home.

## 2013-01-14 ENCOUNTER — Encounter: Payer: Medicare Other | Admitting: Adult Health

## 2013-01-15 DIAGNOSIS — R5383 Other fatigue: Secondary | ICD-10-CM | POA: Diagnosis not present

## 2013-01-15 DIAGNOSIS — E785 Hyperlipidemia, unspecified: Secondary | ICD-10-CM | POA: Diagnosis not present

## 2013-01-15 DIAGNOSIS — I739 Peripheral vascular disease, unspecified: Secondary | ICD-10-CM | POA: Diagnosis not present

## 2013-01-15 DIAGNOSIS — R0989 Other specified symptoms and signs involving the circulatory and respiratory systems: Secondary | ICD-10-CM | POA: Diagnosis not present

## 2013-01-15 DIAGNOSIS — R5381 Other malaise: Secondary | ICD-10-CM | POA: Diagnosis not present

## 2013-01-15 DIAGNOSIS — R0602 Shortness of breath: Secondary | ICD-10-CM | POA: Diagnosis not present

## 2013-01-15 DIAGNOSIS — J441 Chronic obstructive pulmonary disease with (acute) exacerbation: Secondary | ICD-10-CM | POA: Diagnosis not present

## 2013-01-15 DIAGNOSIS — I10 Essential (primary) hypertension: Secondary | ICD-10-CM | POA: Diagnosis not present

## 2013-01-15 DIAGNOSIS — I2510 Atherosclerotic heart disease of native coronary artery without angina pectoris: Secondary | ICD-10-CM | POA: Diagnosis not present

## 2013-01-15 DIAGNOSIS — R0609 Other forms of dyspnea: Secondary | ICD-10-CM | POA: Diagnosis not present

## 2013-01-16 ENCOUNTER — Ambulatory Visit (INDEPENDENT_AMBULATORY_CARE_PROVIDER_SITE_OTHER): Payer: Medicare Other | Admitting: Internal Medicine

## 2013-01-16 ENCOUNTER — Encounter: Payer: Self-pay | Admitting: Internal Medicine

## 2013-01-16 VITALS — BP 110/62 | HR 77 | Ht 69.0 in | Wt 146.0 lb

## 2013-01-16 DIAGNOSIS — J310 Chronic rhinitis: Secondary | ICD-10-CM

## 2013-01-16 NOTE — Progress Notes (Signed)
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 Dr [name] follows for  GOLD IV COPD. PCP Dr Amalia Hailey. Complains of chronic rhinitis and nasal congestion gradually worse over year but always present. Temporary help from nasal sprays but no lasting benefit from Nasacort, Nasonex, ipratropium or Astelin. Using daily Claritin. Feels constant running stuffy nose. Has used oxygen for 4 years. Symptoms never seasonal. Brother is similar. Has known nasal septal deviation. Past history of sinusitis but not recent. Has severe COPD, oxygen 2 L/Advanced. No history of ENT surgery.   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 Dr [name] follows for  GOLD IV COPD. PCP Dr Amalia Hailey FOLLOWS FOR: Nasal congestion is unchanged. States that he was given a medication at his last time OV that worked well but his insurance will not cover it. Was given 2 other medications, which are working but seem to make his throat sore. He is using Astelin and Flonase instead of Dymista because of insurance coverage. Complains still of nasal stuffiness and postnasal drip. He did not try Singulair. Allergy profile 12/03/2012-negative-IgE 94.8 Food IgE profile-negative  ROS-see HPI Constitutional:   No-   weight loss, night sweats, fevers, chills, fatigue, lassitude. HEENT:   No-  headaches, difficulty swallowing, tooth/dental problems, sore throat,       + sneezing, itching, +nasal congestion, +post nasal drip,  CV:  No-   chest pain, orthopnea, PND, swelling in lower extremities, anasarca,  dizziness, palpitations Resp: + shortness of breath with exertion or at rest.              No-   productive cough,  No non-productive cough,  No- coughing up of blood.              No-   change in color of mucus.  No- wheezing.   Skin: No-   rash or lesions. GI:  No-   heartburn, indigestion, abdominal pain, nausea, vomiting,  GU:  MS:   No-   joint pain or swelling.   Neuro-     nothing unusual Psych:  No- change in mood or affect. No depression or anxiety.  No memory loss.  OBJ- Physical Exam General- Alert, Oriented, Affect-appropriate, Distress- none acute, trim. O2 2L Skin- rash-none, lesions- none, excoriation- none Lymphadenopathy- none Head- atraumatic            Eyes- Gross vision intact, PERRLA, conjunctivae and secretions clear            Ears- Hearing, canals-normal            Nose- + Repeated sniffing,Clear, no-Septal dev, mucus, polyps, erosion, perforation             Throat- Mallampati II , mucosa atrophic/ pale , drainage- none, tonsils- atrophic, +mild                         hoarseness Neck- flexible , trachea midline, no stridor , thyroid nl, carotid no bruit Chest - symmetrical excursion , unlabored           Heart/CV- RRR , no murmur , no gallop  , no rub, nl s1 s2                           - JVD- none , edema- none, stasis changes- none, varices- none  Lung- clear to P&A, wheeze- none, cough- none , dullness-none, rub- none           Chest wall-  Abd-  Br/ Gen/ Rectal- Not done, not indicated Extrem- cyanosis- none, clubbing, none, atrophy- none, strength- nl Neuro- grossly intact to observation  Assessment & Plan:

## 2013-01-16 NOTE — Patient Instructions (Signed)
Try using Azelastine/ Astelin antihistamine spray first at bedtime, 1-2 puffs each nostril   Then, 5 minutes later, use the fluticasone/ Flonase anti-inflammatory steroid spray at bedtime, 1-2 puffs each nostril  Rinse your mouth after using these sprays, with a swishy of water or mouthwash.  Please call as needed  Your lab tests did not show signs of allergy. Most likely the nasal drainage is from a non-allergic irritation called vasomotor rhinitis.

## 2013-01-17 ENCOUNTER — Encounter: Payer: Medicare Other | Admitting: Adult Health

## 2013-01-21 ENCOUNTER — Encounter (HOSPITAL_COMMUNITY)
Admission: RE | Admit: 2013-01-21 | Discharge: 2013-01-21 | Disposition: A | Discharge status: Home / Self Care | Payer: Medicare Other | Source: Ambulatory Visit | Attending: Internal Medicine | Admitting: Internal Medicine

## 2013-01-21 DIAGNOSIS — Z951 Presence of aortocoronary bypass graft: Secondary | ICD-10-CM | POA: Diagnosis not present

## 2013-01-21 DIAGNOSIS — G4733 Obstructive sleep apnea (adult) (pediatric): Secondary | ICD-10-CM | POA: Diagnosis not present

## 2013-01-21 DIAGNOSIS — I739 Peripheral vascular disease, unspecified: Secondary | ICD-10-CM | POA: Diagnosis not present

## 2013-01-21 DIAGNOSIS — I2510 Atherosclerotic heart disease of native coronary artery without angina pectoris: Secondary | ICD-10-CM | POA: Insufficient documentation

## 2013-01-21 DIAGNOSIS — I509 Heart failure, unspecified: Secondary | ICD-10-CM | POA: Insufficient documentation

## 2013-01-21 DIAGNOSIS — J9610 Chronic respiratory failure, unspecified whether with hypoxia or hypercapnia: Secondary | ICD-10-CM | POA: Diagnosis not present

## 2013-01-21 DIAGNOSIS — I10 Essential (primary) hypertension: Secondary | ICD-10-CM | POA: Diagnosis not present

## 2013-01-21 DIAGNOSIS — I6529 Occlusion and stenosis of unspecified carotid artery: Secondary | ICD-10-CM | POA: Diagnosis not present

## 2013-01-21 DIAGNOSIS — J4489 Other specified chronic obstructive pulmonary disease: Secondary | ICD-10-CM | POA: Insufficient documentation

## 2013-01-21 DIAGNOSIS — Z7982 Long term (current) use of aspirin: Secondary | ICD-10-CM | POA: Insufficient documentation

## 2013-01-21 DIAGNOSIS — J449 Chronic obstructive pulmonary disease, unspecified: Secondary | ICD-10-CM | POA: Diagnosis not present

## 2013-01-21 DIAGNOSIS — Z5189 Encounter for other specified aftercare: Secondary | ICD-10-CM | POA: Diagnosis not present

## 2013-01-21 DIAGNOSIS — J984 Other disorders of lung: Secondary | ICD-10-CM | POA: Insufficient documentation

## 2013-01-23 ENCOUNTER — Encounter (HOSPITAL_COMMUNITY): Payer: Self-pay

## 2013-01-23 ENCOUNTER — Encounter (HOSPITAL_COMMUNITY)
Admission: RE | Admit: 2013-01-23 | Discharge: 2013-01-23 | Disposition: A | Discharge status: Home / Self Care | Payer: Medicare Other | Source: Ambulatory Visit | Attending: Internal Medicine | Admitting: Internal Medicine

## 2013-01-23 DIAGNOSIS — Z5189 Encounter for other specified aftercare: Secondary | ICD-10-CM | POA: Diagnosis not present

## 2013-01-23 DIAGNOSIS — I509 Heart failure, unspecified: Secondary | ICD-10-CM | POA: Diagnosis not present

## 2013-01-23 DIAGNOSIS — G4733 Obstructive sleep apnea (adult) (pediatric): Secondary | ICD-10-CM | POA: Diagnosis not present

## 2013-01-23 DIAGNOSIS — J9610 Chronic respiratory failure, unspecified whether with hypoxia or hypercapnia: Secondary | ICD-10-CM | POA: Diagnosis not present

## 2013-01-23 DIAGNOSIS — J449 Chronic obstructive pulmonary disease, unspecified: Secondary | ICD-10-CM | POA: Diagnosis not present

## 2013-01-23 DIAGNOSIS — Z951 Presence of aortocoronary bypass graft: Secondary | ICD-10-CM | POA: Diagnosis not present

## 2013-01-23 NOTE — Progress Notes (Signed)
Nutrition Note Spoke with pt briefly. Pt reports recent h/o wt loss. Pt wt has declined 8 lbs over the past 10 months, which is a 5.2% decrease. Pt BMI 21.6. Pt reports he is trying to combat wt loss with "a snack every now and then." Supplements discussed. Pt does not want to drink Ensure or Boost due to "the chemicals that are in them." Risk of low body wt with lung disease reviewed with pt. Pt willing to consider a whey protein supplement. Pt encouraged to check out weight gain supplements at any health food store (e.g. [employer], [employer],etc.). Pt willing to entertain the idea. Continue client-centered nutrition education by RD as part of interdisciplinary care.  Monitor and evaluate progress toward nutrition goal with team.  Derek Mound, M.Ed, RD, LDN, CDE 01/23/2013 2:57 PM

## 2013-01-23 NOTE — Progress Notes (Signed)
I have reviewed a Home Exercise Prescription with [name] . [name] is currently exercising at home. He is currently performing his stretches in the morning, walking up and down his steps 4-5 times a day, doing 2 sets of 10 reps with his 3 pound hand weights, and walking on his treadmill 3-4 times a week for 15 minutes at a speed of 1.2. The patient was advised to walk at least 3 days a week for 20 minutes.  He was also asked to perform 3 sets of 12 reps with his 3 pound weights.  [name] and I discussed how to progress their exercise prescription.  The patient stated that their goals were to be able to do things around the house, walk longer distances without getting so short of breath, and live longer.  The patient stated that they understand the exercise prescription.  We reviewed exercise guidelines, target heart rate during exercise, oxygen use, weather, home pulse oximeter, endpoints for exercise, and goals.  Patient is encouraged to come to me with any questions. I will continue to follow up with the patient to assist them with progression and safety.

## 2013-01-28 ENCOUNTER — Encounter (HOSPITAL_COMMUNITY)
Admission: RE | Admit: 2013-01-28 | Discharge: 2013-01-28 | Disposition: A | Discharge status: Home / Self Care | Payer: Medicare Other | Source: Ambulatory Visit | Attending: Internal Medicine | Admitting: Internal Medicine

## 2013-01-28 DIAGNOSIS — Z5189 Encounter for other specified aftercare: Secondary | ICD-10-CM | POA: Diagnosis not present

## 2013-01-28 DIAGNOSIS — G4733 Obstructive sleep apnea (adult) (pediatric): Secondary | ICD-10-CM | POA: Diagnosis not present

## 2013-01-28 DIAGNOSIS — I509 Heart failure, unspecified: Secondary | ICD-10-CM | POA: Diagnosis not present

## 2013-01-28 DIAGNOSIS — J9610 Chronic respiratory failure, unspecified whether with hypoxia or hypercapnia: Secondary | ICD-10-CM | POA: Diagnosis not present

## 2013-01-28 DIAGNOSIS — J449 Chronic obstructive pulmonary disease, unspecified: Secondary | ICD-10-CM | POA: Diagnosis not present

## 2013-01-28 DIAGNOSIS — Z951 Presence of aortocoronary bypass graft: Secondary | ICD-10-CM | POA: Diagnosis not present

## 2013-01-28 NOTE — Psychosocial Assessment (Signed)
[name] 80 y.o. male  30 day Psychosocial Note  Patient psychosocial assessment reveals one  barrier to participation in Pulmonary Rehab. Psychosocial area that has affected patient's rehab experience in the first 30 days is his attendance.  He has only attended 4 exercise sessions as of today and his first day of exercise was 01/07/2013.  He became sick with an upper respiratory infection just as he was to begin the exercise component of the program, and due to the [employer] holiday our program was closed on another of his exercise sessions.  Because of this patient has not seen a dramatic change in his stamina and endurance.   Patient does continue to exhibit positive coping skills to deal with his psychosocial concerns. Offered emotional support and reassurance. Patient does not feel he has made profound  progress toward Pulmonary Rehab goals, again due to his attendance.  Patient did participate in the physician lecture session on January 23, 2013 and stated he learned more about his pulmonary diagnosis in that 1 hour than ever before.   Patient states family/friends have not noticed changes in his activity or mood. Patient reports feeling positive about current and projected progression in Pulmonary Rehab. After reviewing the patient's treatment plan, the patient is making progress toward Pulmonary Rehab goals. Patient's rate of progress toward rehab goals is fair. Plan of action to help patient continue to work towards rehab goals include increasing workloads as appropriate and teach purse-lip breathing. Will continue to monitor and evaluate progress toward psychosocial goal(s).  Goal(s) in progress: Continue in teaching patient purse-lip breathing Continue to improve management of anxiety due to increased age by improving endurance and stamina Improve coping skills Help patient work toward returning to meaningful activities that improve patient's QOL and are attainable with patient's lung  disease

## 2013-01-28 NOTE — Psychosocial Assessment (Signed)
reviweed  Dr. Brand Males, M.D., [facility].C.P Pulmonary and Critical Care Medicine Staff Physician [place] [facility] Pager: [phone], If no answer or between  15:00h - 7:00h: call [phone]  01/28/2013 7:32 PM

## 2013-01-30 ENCOUNTER — Encounter (HOSPITAL_COMMUNITY)
Admission: RE | Admit: 2013-01-30 | Discharge: 2013-01-30 | Disposition: A | Discharge status: Home / Self Care | Payer: Medicare Other | Source: Ambulatory Visit | Attending: Internal Medicine | Admitting: Internal Medicine

## 2013-01-30 NOTE — Progress Notes (Signed)
[name] 80 y.o. male Nutrition Note Spoke with pt. Pt is at a normal weight. Pt reports his UBW was "165 or more for years." Pt states he weighed 157 lb in 04/2012. According to EMR pt wt was 154.6 lb on 04/05/12 and is now 143.9 lb. Pt's current wt is down 10.7 lb over the past 10 months.  Pt's Rate Your Plate results reviewed with pt. Pt educated re: High Calorie, High Protein diet. Pt reports difficulty with bowel regularity, which is resolved with increasing fiber and fluids when needed. Pt states he does not regularly take any medications for his bowels. Pt avoids salty food; does not use canned/ convenience food.  Pt does not add salt to food.  The role of sodium in lung disease reviewed with pt. Pt expressed understanding of the information reviewed.   Nutrition Diagnosis   Food-and nutrition-related knowledge deficit related to lack of exposure to information as related to diagnosis of pulmonary disease   Increased energy expenditure related to increased energy requirements during COPD exacerbation as evidenced by recent h/o wt loss of 6.9% over the past 10 months.  Nutrition Rx/Est. Daily Nutrition Needs for: ? wt gain 2500-3000 Kcal  100-130 gm protein   1500 mg or less sodium      Nutrition Intervention   Pt's individual nutrition plan and goals reviewed with pt.   Benefits of adopting healthy eating habits discussed when pt's Rate Your Plate reviewed.   Handouts given for: High Calorie, High Protein diet and recipes, and Suggestions for increasing calories and protein   Pt to attend the Nutrition and Lung Disease class   Continual client-centered nutrition education by RD, as part of interdisciplinary care. Goal(s) 1. The pt will recognize symptoms that can interfere with adequate oral intake, such as shortness of breath, N/V, early satiety, fatigue, ability to secure and prepare food, taste and smell changes, chewing/swallowing difficulties, and/ or pain when eating. 2. The pt will  consume high-energy, high-nutrient dense beverages when necessary to compensate for decreased oral intake of solid foods. 3. Identify food quantities necessary to achieve wt gain of  -2# per week to a goal wt of 68.1-72.7 kg (150-160 lb) at graduation from pulmonary rehab. Monitor and Evaluate progress toward nutrition goal with team.   Derek Mound, M.Ed, RD, LDN, CDE 01/30/2013 2:47 PM

## 2013-02-04 ENCOUNTER — Encounter (HOSPITAL_COMMUNITY)
Admission: RE | Admit: 2013-02-04 | Discharge: 2013-02-04 | Disposition: A | Discharge status: Home / Self Care | Payer: Medicare Other | Source: Ambulatory Visit | Attending: Internal Medicine | Admitting: Internal Medicine

## 2013-02-04 DIAGNOSIS — I509 Heart failure, unspecified: Secondary | ICD-10-CM | POA: Diagnosis not present

## 2013-02-04 DIAGNOSIS — G4733 Obstructive sleep apnea (adult) (pediatric): Secondary | ICD-10-CM | POA: Diagnosis not present

## 2013-02-04 DIAGNOSIS — J9610 Chronic respiratory failure, unspecified whether with hypoxia or hypercapnia: Secondary | ICD-10-CM | POA: Diagnosis not present

## 2013-02-04 DIAGNOSIS — J449 Chronic obstructive pulmonary disease, unspecified: Secondary | ICD-10-CM | POA: Diagnosis not present

## 2013-02-04 DIAGNOSIS — Z951 Presence of aortocoronary bypass graft: Secondary | ICD-10-CM | POA: Diagnosis not present

## 2013-02-04 DIAGNOSIS — Z5189 Encounter for other specified aftercare: Secondary | ICD-10-CM | POA: Diagnosis not present

## 2013-02-06 ENCOUNTER — Encounter (HOSPITAL_COMMUNITY)
Admission: RE | Admit: 2013-02-06 | Discharge: 2013-02-06 | Disposition: A | Discharge status: Home / Self Care | Payer: Medicare Other | Source: Ambulatory Visit | Attending: Internal Medicine | Admitting: Internal Medicine

## 2013-02-06 DIAGNOSIS — J9610 Chronic respiratory failure, unspecified whether with hypoxia or hypercapnia: Secondary | ICD-10-CM | POA: Diagnosis not present

## 2013-02-06 DIAGNOSIS — Z5189 Encounter for other specified aftercare: Secondary | ICD-10-CM | POA: Diagnosis not present

## 2013-02-06 DIAGNOSIS — I509 Heart failure, unspecified: Secondary | ICD-10-CM | POA: Diagnosis not present

## 2013-02-06 DIAGNOSIS — J449 Chronic obstructive pulmonary disease, unspecified: Secondary | ICD-10-CM | POA: Diagnosis not present

## 2013-02-06 DIAGNOSIS — G4733 Obstructive sleep apnea (adult) (pediatric): Secondary | ICD-10-CM | POA: Diagnosis not present

## 2013-02-06 DIAGNOSIS — Z951 Presence of aortocoronary bypass graft: Secondary | ICD-10-CM | POA: Diagnosis not present

## 2013-02-09 NOTE — Assessment & Plan Note (Signed)
Allergy profile 12/03/2012-negative-IgE 94.8  Food IgE profile-negative Nonallergic rhinitis, probably vasomotor and irritant. He gets some symptomatic benefit from his nasal sprays and was educated again on proper use.

## 2013-02-11 ENCOUNTER — Encounter (HOSPITAL_COMMUNITY): Payer: Medicare Other

## 2013-02-12 ENCOUNTER — Encounter: Payer: Self-pay | Admitting: Cardiology

## 2013-02-12 DIAGNOSIS — R002 Palpitations: Secondary | ICD-10-CM | POA: Diagnosis not present

## 2013-02-12 DIAGNOSIS — R5381 Other malaise: Secondary | ICD-10-CM | POA: Diagnosis not present

## 2013-02-12 DIAGNOSIS — E785 Hyperlipidemia, unspecified: Secondary | ICD-10-CM | POA: Diagnosis not present

## 2013-02-12 DIAGNOSIS — I119 Hypertensive heart disease without heart failure: Secondary | ICD-10-CM | POA: Diagnosis not present

## 2013-02-12 DIAGNOSIS — R5383 Other fatigue: Secondary | ICD-10-CM | POA: Diagnosis not present

## 2013-02-12 DIAGNOSIS — I2510 Atherosclerotic heart disease of native coronary artery without angina pectoris: Secondary | ICD-10-CM | POA: Diagnosis not present

## 2013-02-12 DIAGNOSIS — R0989 Other specified symptoms and signs involving the circulatory and respiratory systems: Secondary | ICD-10-CM | POA: Diagnosis not present

## 2013-02-12 DIAGNOSIS — R0609 Other forms of dyspnea: Secondary | ICD-10-CM | POA: Diagnosis not present

## 2013-02-12 DIAGNOSIS — J441 Chronic obstructive pulmonary disease with (acute) exacerbation: Secondary | ICD-10-CM | POA: Diagnosis not present

## 2013-02-12 DIAGNOSIS — I739 Peripheral vascular disease, unspecified: Secondary | ICD-10-CM | POA: Diagnosis not present

## 2013-02-12 NOTE — Progress Notes (Signed)
Patient ID: [name], male   DOB: 09/12/1929, 80 y.o.   MRN: [id number]   [name], [name]    Date of visit:  02/12/2013 DOB:  05/30/1929    Age:  80 yrs. Medical record number:  [id number]     Account number:  [id number] Primary Care Provider: ROSS, C. ALAN ____________________________ CURRENT DIAGNOSES  1. Fatigue/malaise  2. Palpitations  3. Dyspnea  4. CAD,Native  5. Hypertensive Heart Disease-Benign without CHF  6. Hyperlipidemia  7. Peripheral Vascular Disease  8. COPD ____________________________ ALLERGIES  Cefdinir, Intolerance-unknown  Sulfa (Sulfonamides), Intolerance-unknown ____________________________ MEDICATIONS  1. aspirin 325 mg tablet, 1 p.o. daily  2. atorvastatin 20 mg tablet, 1 p.o. daily  3. Vitamin D3 1,000 unit capsule, 1 p.o. daily  4. furosemide 20 mg tablet, PRN  5. guaifenesin 600 mg tablet,extended release biphasic 12 hr, PRN  6. Xopenex HFA 45 mcg/actuation aerosol inhaler, PRN  7. metoprolol succinate ER 25 mg tablet,extended release 24 hr, 1 p.o. daily  8. Nasonex 50 mcg/actuation Spray, prn  9. multivitamin tablet, 1 p.o. daily  10. ranitidine 150 mg tablet, bid prn  11. Symbicort 160 mcg-4.5 mcg/actuation HFA aerosol inhaler, 2 puff BID  12. Atrovent HFA 17 mcg/actuation aerosol inhaler, qid ____________________________ CHIEF COMPLAINTS  Palpitations ____________________________ HISTORY OF PRESENT ILLNESS Patient seen earlier for evaluation of dizziness and palpitations. He has been using more of his inhalers recently and feels as if he has been more short of breath. He awoke yesterday morning and while standing in preparing breakfast he became somewhat lightheaded. This does not entirely unusual for him but he then noted some occasional skips and took his pulse and would notice that he would feel as if his pulse would miss a beat. He became anxious about this but then later took an inhaler and felt better. He later noted some dizziness as well as  skipping later on today and contemplated going to the emergency room but did not go and eventually decided to be seen here. This morning he complained of occasional dizziness. He mainly notes that his pulse is irregular when he is checking it. He has not had syncope. He in addition complains of chest pain if he turns his head to the side or lifts up his arm he describes it more as muscular pain. He does not have any anginal type pains. ____________________________ PAST HISTORY  Past Medical Illnesses:  hypertension, hyperlipidemia, COPD, gait disorder, chronic venous insufficiency, kidney stones;  Cardiovascular Illnesses:  CAD, Carotid artery disease, peripheral vascular disease;  Surgical Procedures:  CABG, cataract extraction, lithotripsy;  Cardiology Procedures-Invasive:  cardiac cath (left), CABG with LIMA to LAD, SVG to OM1-2, SVG to RCA and PDA 09/13/2001 Dr. Servando Snare;  Cardiology Procedures-Noninvasive:  treadmill, adenosine cardiolite, echocardiogram January 2015;  Cardiac Cath Results:  95% stenosis LAD, 80% stenosis mid LAD, 90% stenosis proximal OM 1 OM 2, 95% stenosis mid RCA, 90% stenosis proximal PDA;  LVEF of 55% documented via echocardiogram on 01/15/2013,   ____________________________ CARDIO-PULMONARY TEST DATES EKG Date:  02/12/2013;  CABG: 09/13/2001;  Echocardiography Date: 01/15/2013;  Chest Xray Date: 04/05/2012;   ____________________________ FAMILY HISTORY Brother -- Brother dead, CVA Brother -- Coronary Artery Disease, Hypertension Father -- Father dead, Myocardial infarction Mother -- Mother dead, Coronary Artery Disease, Hypertension ____________________________ SOCIAL HISTORY Alcohol Use:  no alcohol use;  Smoking:  used to smoke but quit 1983;  Diet:  regular diet;  Lifestyle:  married;  Exercise:  walking for approximately 15 minutes 4 days per  week;  Occupation:  retired, Higher education careers adviser guardian;  Residence:  lives with wife;   ____________________________ REVIEW OF  SYSTEMS General:  malaise and fatigue Eyes: wears eye glasses/contact lenses, cataracts, cataract extraction Respiratory: dyspnea with exertion Cardiovascular:  please review HPI Abdominal: dyspepsia Genitourinary-Male: frequency, nocturia  Musculoskeletal:  denies arthritis, venous insufficiency, or muscle weakness. Psychiatric:  denies depession or anxiety.  ____________________________ PHYSICAL EXAMINATION VITAL SIGNS  Blood Pressure:  142/60 Sitting, Left arm, regular cuff  , 140/64 Standing, Left arm and regular cuff   Pulse:  76/min. Weight:  145.00 lbs. Height:  68"BMI: 22  Constitutional:  pleasant white male in no acute distress Skin:  warm and dry to touch, no apparent skin lesions, or masses noted. Head:  normocephalic, normal hair pattern, no masses or tenderness Neck:  supple, without massess. No JVD, thyromegaly or carotid bruits. Carotid upstroke normal. Chest:  clear to auscaltation, healed median sternotomy scar Cardiac:  regular rhythm, normal S1 and S2, No S3 or S4, no murmurs, gallops or rubs detected., frequent premature beats Peripheral Pulses:  femoral pulse(s) 2+, pulses below the femoral arteries are diminished, bilateral femoral bruits present Extremities & Back:  well healed saphenous vein donor site RLE, bilateral lower extremity venous varicosities, 1+ edema Neurological:  no gross motor or sensory deficits noted, affect appropriate, oriented x3. ____________________________ MOST RECENT LIPID PANEL 06/04/12  CHOL TOTL 184 mg/dl, LDL 103 NM, HDL 66 mg/dl, TRIGLYCER 62 mg/dl and CHOL/HDL 2.8 (Calc) ____________________________ IMPRESSIONS/PLAN  1. Episodic palpitations that may be premature atrial contractions. We need to be sure he is not having atrial fibrillation. The palpitations may be worsened by more frequent use of his inhalers 2. Atypical chest pain 3. Episodic dizziness 4. Coronary artery disease with previous bypass grafting  Recommendations:  His  EKG shows sinus rhythm with PACs. He is not having any ischemic changes. I would like for him to wear a cardiac event monitor and will see him following completion of this. In addition because of worsening SOB, I would like him to see his pulmonologist again soon. ____________________________ TODAYS ORDERS  1. 12 Lead EKG: Today  2. [place]: Today  3. Return Visit: 1 month                       ____________________________ Cardiology Physician:  Kerry Hough MD [facility]

## 2013-02-13 ENCOUNTER — Encounter (HOSPITAL_COMMUNITY)
Admission: RE | Admit: 2013-02-13 | Discharge: 2013-02-13 | Disposition: A | Discharge status: Home / Self Care | Payer: Medicare Other | Source: Ambulatory Visit | Attending: Internal Medicine | Admitting: Internal Medicine

## 2013-02-13 ENCOUNTER — Other Ambulatory Visit: Payer: Self-pay | Admitting: Vascular Surgery

## 2013-02-13 DIAGNOSIS — J984 Other disorders of lung: Secondary | ICD-10-CM | POA: Insufficient documentation

## 2013-02-13 DIAGNOSIS — I509 Heart failure, unspecified: Secondary | ICD-10-CM | POA: Diagnosis not present

## 2013-02-13 DIAGNOSIS — G4733 Obstructive sleep apnea (adult) (pediatric): Secondary | ICD-10-CM | POA: Insufficient documentation

## 2013-02-13 DIAGNOSIS — Z951 Presence of aortocoronary bypass graft: Secondary | ICD-10-CM | POA: Insufficient documentation

## 2013-02-13 DIAGNOSIS — I6529 Occlusion and stenosis of unspecified carotid artery: Secondary | ICD-10-CM | POA: Diagnosis not present

## 2013-02-13 DIAGNOSIS — I2510 Atherosclerotic heart disease of native coronary artery without angina pectoris: Secondary | ICD-10-CM | POA: Insufficient documentation

## 2013-02-13 DIAGNOSIS — R002 Palpitations: Secondary | ICD-10-CM | POA: Diagnosis not present

## 2013-02-13 DIAGNOSIS — J9610 Chronic respiratory failure, unspecified whether with hypoxia or hypercapnia: Secondary | ICD-10-CM | POA: Insufficient documentation

## 2013-02-13 DIAGNOSIS — J4489 Other specified chronic obstructive pulmonary disease: Secondary | ICD-10-CM | POA: Insufficient documentation

## 2013-02-13 DIAGNOSIS — Z7982 Long term (current) use of aspirin: Secondary | ICD-10-CM | POA: Insufficient documentation

## 2013-02-13 DIAGNOSIS — Z5189 Encounter for other specified aftercare: Secondary | ICD-10-CM | POA: Diagnosis not present

## 2013-02-13 DIAGNOSIS — I119 Hypertensive heart disease without heart failure: Secondary | ICD-10-CM | POA: Diagnosis not present

## 2013-02-13 DIAGNOSIS — I739 Peripheral vascular disease, unspecified: Secondary | ICD-10-CM | POA: Insufficient documentation

## 2013-02-13 DIAGNOSIS — J449 Chronic obstructive pulmonary disease, unspecified: Secondary | ICD-10-CM | POA: Insufficient documentation

## 2013-02-13 DIAGNOSIS — I10 Essential (primary) hypertension: Secondary | ICD-10-CM | POA: Insufficient documentation

## 2013-02-13 DIAGNOSIS — R5381 Other malaise: Secondary | ICD-10-CM | POA: Diagnosis not present

## 2013-02-13 DIAGNOSIS — R0609 Other forms of dyspnea: Secondary | ICD-10-CM | POA: Diagnosis not present

## 2013-02-13 DIAGNOSIS — J441 Chronic obstructive pulmonary disease with (acute) exacerbation: Secondary | ICD-10-CM | POA: Diagnosis not present

## 2013-02-13 DIAGNOSIS — R0989 Other specified symptoms and signs involving the circulatory and respiratory systems: Secondary | ICD-10-CM | POA: Diagnosis not present

## 2013-02-13 DIAGNOSIS — E785 Hyperlipidemia, unspecified: Secondary | ICD-10-CM | POA: Diagnosis not present

## 2013-02-13 DIAGNOSIS — R5383 Other fatigue: Secondary | ICD-10-CM | POA: Diagnosis not present

## 2013-02-14 ENCOUNTER — Ambulatory Visit (INDEPENDENT_AMBULATORY_CARE_PROVIDER_SITE_OTHER): Payer: Medicare Other | Admitting: Internal Medicine

## 2013-02-14 ENCOUNTER — Encounter: Payer: Self-pay | Admitting: Internal Medicine

## 2013-02-14 VITALS — BP 128/78 | HR 73 | Temp 97.5°F | Ht 69.0 in | Wt 148.2 lb

## 2013-02-14 DIAGNOSIS — J9610 Chronic respiratory failure, unspecified whether with hypoxia or hypercapnia: Secondary | ICD-10-CM

## 2013-02-14 DIAGNOSIS — R002 Palpitations: Secondary | ICD-10-CM | POA: Diagnosis not present

## 2013-02-14 DIAGNOSIS — J449 Chronic obstructive pulmonary disease, unspecified: Secondary | ICD-10-CM

## 2013-02-14 MED ORDER — TIOTROPIUM BROMIDE MONOHYDRATE 2.5 MCG/ACT IN AERS: INHALATION_SPRAY | RESPIRATORY_TRACT | Status: DC

## 2013-02-14 NOTE — Progress Notes (Signed)
Spoke with pt and notified of recs per MW He verbalized understanding and rx was sent to pharm  Subjective:    Patient ID: [name], male    DOB: 1929/04/20   MRN: [id number]     Summary: Pulmonary/ f/u ov try astepro for pnds   Primary Provider/Referring Provider: [name]    Brief patient profile:  83   yowm last smoked 1983 severe COPD FEV1 of only 36% April 2007 with a 29% improvement after bronchodilators consistent with an asthmatic component. Baseline activity level--walk 1 mile daily with oxygen on. Maintained on Symbicort 160/4.50mcg 1 puff two times a day.    History of Present Illness  February 11, 2008 ov never took spiriva consistently. No variability to doe or sign cough. rec spiriva but w/in one week cp > M[facility] with neg cardiac wu, still having occ cp , positional, better with rubbing chest and relaxing.  Retry spiriva   October 15, 2008 rov follow up, about the same did not get along well with spiriva , discont. taking caused dry mouth. still definitely benefits from as needed xopenex.    February 18, 2010 -- ov cc cough with green/white mucus, wheezing, SOB x5days. OTC not helping. Cough is worse at night, keeping him up at times. Wheezing started yesterday. rex doxy, prednisone then admit 2/14 to [facility] and d/c 2/17 with ct c/w emphysema > d/c on zmax and prednisone tapered off as of March 03, 2010   March 03, 2010 ov cc  minimally improved sob and cough with green sputum and nasal congestion esp in am .. doex > slow adls  minimally better p nebs rec  See Patient Care Coordinator before leaving for sinus CT  Thickening only  Prilosec otc 20mg  30 min before bfast and zantac 150 at bedtime as long as coughing ( reflux is to cough what oxygen is to fire)  Augmentin 875 mg twice daily with water and eat yogurt for lunch  Prednisone 10mg  2 daily back to normal then 1 daily until return.   March 17, 2010 ov Cough and Dyspnea- some better, mucus is now white and less volume  worse lying down, less need for rescue.  rec taper off prednisone >  5 days off  04/28/2010 ov/Andriy Sherk no worse off prednisone,  Rare astepro, no need for rescue or even using the atrovent/xop more than twice dailly - overall much better.   rec Ipatropium / xopenex are twice regulary with ability to increase to 4 x daily if breathing worsens   10/14/2010 f/u ov/Shenae Bonanno cc Pt states breathing worse with exertion x 1 month. Still has no cough but lots of mucus esp in am with  No purulent sputum. ent wants to try ipatropium and scopalamine to dry up his drippy nose - no longer following med calendar for action plan at bottom rec Change ipatropium to 4 x daily and just use Only use your xopenex as a rescue medication to be used if you can't catch your breath by resting or doing a relaxed purse lip breathing pattern. The less you use it, the better it will work when you need it.   Prednisone 10 mg take  4 each am x 2 days,   2 each am x 2 days,  1 each am x2days and stop   Please schedule a follow up office visit in 6 weeks, call sooner if needed but bring the medication calendar with you   11/25/2010 f/u ov/Yaniah Thiemann cc breathing better, nasal drainage better.minimal  cough. Doe at [place].minimal use of rescue   rec Follow med calendar  03/10/2011 Follow up and Med review  Returns for follow up and med review. We reviewed his meds and organized them into a med calendar with pt educaiton.   rec  05/01/2011 f/u ov/Carder Yin cc 50 ft outside, one aisle at [employer], whole store with 02. No cough and minimal need for saba  Seen at [facility] rec change ipatropium neb  to atrovent > no change doe. rec ry tudorza one puff twice daily and stop atrovent and see how it effects your activity performance, if not benefit ok to change back to atrovent   10/04/2011 f/u ov/Dawsyn Zurn did not bring med calendar cc  Loosing ground on tudorza so went to atrovent neb and seemed to help. rec Plan A = use automatically no matter what Symbicort  followed by Atrovent each am and symbiocort again in 12 hours plus continue the atrovent though the day in between about every 4-6 hours  Only use your albuterol (xopenex hfa -puffer-  is Plan B,  Nebulizer is plan C) as a rescue medication to be used if you can't catch your breath by resting or doing a relaxed purse lip breathing pattern. The less you use it, the better it will work when you need it.      Late add: Struggling a bit with implementation of action plans, need to review med calendar next ov   12/21/2011 f/u ov/Jadian Karman cc doe loosing ground = one aisle slow off 02,  Does ok on 02 at grocery, but even on 02 on treadmill at usual grade (flat) and speed (1.4 mph) on 02 x 20 min where used to to 1.5 x 30 min.  qnasal helped nasal discharge but increased bp so stopped it and drainage came back. rec Restart qnasal and monitor your blood pressure  Always wear 02 2lpm at bedtime and when walking more than room to room at home    01/29/2012 f/u ov/Merica Prell cc slt better doe, fought off cold ok x sev weeks prior to OV  and back to rare if ever use xopenex  maint complaint is persistent x years watery rhinitis worse with cold air exp and with eating, no better on qnasal.     No obvious daytime variabilty or assoc chronic cough or cp or chest tightness, subjective wheeze overt sinus or hb symptoms. No unusual exp hx  >chlortrimeton As needed  > did not try   04/05/2012 Acute OV  Complains of  increased SOB, some wheezing and tightness in chest x3 weeks. Patient denies any discolored mucus, or fever, chest pain, orthopnea. Has had increased lower extremity swelling. Has been worse over the last few weeks. Did try Lasix 20 mg daily, but did not see much improvement. Patient denies any calf pain, hemoptysis, or known injury. Has had quite a bit of nasal congestion, and drainage. He denies sinus pain or pressure. rec Lasix 20mg   2 tabs daily for 3 days then As needed  For leg swelling.       05/22/2012 f/u ov/Eddie Payette copd/ resp failure on 02 2lpm sleeping and with activity Chief Complaint  Patient presents with  . Follow-up    Breathing improved since the last visit, not back at his baseline yet. No new co's today.   Sense of pnds worst in am never expectorates, better for only 5 min p "does his trick" to clear his throat. Rare use of xopenex hfa daytime, never needs neb saba rec  Take zantac 150 mg and chlortrimeton 4 mg at bedtime  To see what effect if any it has on your am congestion in your throat and nose   09/30/2012 f/u ov/Pauleen Goleman re: copd/02 dep  Chief Complaint  Patient presents with  . Follow-up    Pt states breathing worse for the past 5 wks. He states that he gets SOB and dizzy with minimal exertion.    dizzy ? Since started Zmax at [facility]. Doe x 100 ft even on 02, less cough on rx Confused with action plan meds even though read him med caledar back to me correctly   >>pred taper   10/15/2012 Follow up and Med review  Patient returns for a followup and medication review. Reviewed all his medications and organized them into a medication calendar with patient education. Appears the patient is taking his medications correctly. Patient requests a referral to allergist. Today. He complains of persistent nasal congestion, and drainage. We discussed using saline nasal rinses and saline gel to help with his nasal dryness. Due to oxygen. Use. Patient denies any hemoptysis, orthopnea, PND, or leg swelling. Last visit. Patient was having a flare. COPD, was treated with a prednisone taper, which is now resolved. Patient's breathing is back to baseline. rec Prednisone 10 mg take  4 each am x 2 days,   2 each am x 2 days,  1 each am x 2 days and stop  Stop zithromax Plan A symbiocort and Ipatropium Plan B = backup > xopenex 2 every 4 if needed Plan C = xopenex neb every 4 hours if needed   12/18/2012 f/u ov/Teasha Murrillo re: copd/ 02  at bedtime and with  exertion  Chief Complaint  Patient presents with  . Follow-up    Increased sob at all times x1 month.  using O2 more frequently   actually no sob at rest or at hs, wears 02 with activity and when he does wear it the sats are fine even when he has to stop due to sob rec Plan A= automatic  symbiocort and tudorza twice daily and ok to use your 02 more consistently with activity that you know makes you short of breath Plan B = backup > xopenex 2 every 4 if needed if can't catch your breath Plan C = xopenex neb every 4 hours if plan B not working, always try plan B first  Please see patient coordinator before you leave today  to schedule rehab  02/14/2013 f/u ov/Mckayla Mulcahey re:  Chief Complaint  Patient presents with  . Follow-up    Wants to make sure his medications are correct.  Pt c/o increased SOB with exertion.  easily confused with details of care, only using hfa xopenex not the neb xopenex, times right before he goes to rehab and feels he's making slow progress.   No obvious day to day or daytime variabilty or assoc chronic cough or cp or chest tightness, subjective wheeze overt sinus or hb symptoms. No unusual exp hx or h/o childhood pna/ asthma or knowledge of premature birth.  Sleeping ok without nocturnal  or early am exacerbation  of respiratory  c/o's or need for noct saba. Also denies any obvious fluctuation of symptoms with weather or environmental changes or other aggravating or alleviating factors except as outlined above   Current Medications, Allergies, Complete Past Medical History, Past Surgical History, Family History, and Social History were reviewed in [employer] record.  ROS  The following are not active  complaints unless bolded sore throat, dysphagia, dental problems, itching, sneezing,  nasal congestion or excess/ purulent secretions, ear ache,   fever, chills, sweats, unintended wt loss, pleuritic or exertional cp, hemoptysis,  orthopnea pnd or leg  swelling, presyncope, palpitations, heartburn, abdominal pain, anorexia, nausea, vomiting, diarrhea  or change in bowel or urinary habits, change in stools or urine, dysuria,hematuria,  rash, arthralgias, visual complaints, headache, numbness weakness or ataxia or problems with walking or coordination,  change in mood/affect or memory.                Past Medical History:  CHRONIC RHINITIS (ICD-472.0)  - Sinus ct March 03, 2010 > thickening only, no acute changes  CHRONIC RESPIRATORY FAILURE (ICD-518.83)  - 02 rx since 2008  COPD UNSPECIFIED (ICD-496)....................................................................[name]  - PFTs 05/04/05 FEV1 36% ratio 28% with 29% improvement after bronchodilators DLCO 48%  - PFT's 03/26/08 30% ratio 34 with 14% improvement after bronchodilators DLC0 38 %  - Start noct 02 at 2lpm 2008 and on 03/04/09 desat @ > 185 ft so rec wear with activtiy > rm to rm  - [place] June 30, 2008 100% > confirmed March 04, 2009  ISCHEMIC HEART DISEASE (ICD-414.9)  HYPERTENSION (ICD-401.9)  HYPERLIPIDEMIA (ICD-272.4)  SPN RUL by cxr August 19, 2009  COMPLEX MED REGIMEN-Meds reviewed with pt education and computerized med calendar 11/22/09 03/10/2011  , 10/15/2012                           Objective:   Physical Exam  amb wm nad with nl vitals  wt 159 04/28/2010  > 10/14/2010 154  > 10/04/2011 150> 12/21/2011  150 > 149 01/29/2012 >154 04/05/2012 > 05/22/2012 148 > 09/30/2012  146 >>144 10/15/2012 > 12/18/2012  145 > 148 02/14/2013   HEENT mild turbinate edema.  Oropharynx no thrush or excess pnd or cobblestoning.  No JVD or cervical adenopathy. Mild accessory muscle hypertrophy. Trachea midline, nl thryroid. Chest was hyperinflated by percussion with diminished breath sounds and moderate increased exp time without wheeze. Hoover sign positive at mid inspiration. Regular rate and rhythm without murmur gallop or rub or increase P2 or edema.  Abd: no hsm, nl excursion.  Ext warm without cyanosis or clubbing.       04/08/12 cxr  No active disease. No significant change. Mild hyperinflation.    Assessment & Plan:

## 2013-02-14 NOTE — Patient Instructions (Addendum)
Plan A = Dulera 200 2 puffs each  AM and spiriva each am and then dulera 200 2 puffs in pm  Plan B = Only use your levoalbuterol as a rescue medication to be used if you can't catch your breath by resting or doing a relaxed purse lip breathing pattern.  - The less you use it, the better it will work when you need it. - Ok to use up to 2 puffs  every 4 hours if you must but call for immediate appointment if use goes up over your usual need - Don't leave home without it !!  (think of it like the spare tire for your car)    Plan C = xopenex neb every 4 hours as needed  Stop ipatropium/atrovent    See Tammy NP w/in 4  weeks with all your medications, even over the counter meds, separated in two separate bags, the ones you take no matter what vs the ones you stop once you feel better and take only as needed when you feel you need them.   Tammy  will generate for you a new user friendly medication calendar that will put [place] all on the same page re: your medication use.     Without this process, it simply isn't possible to assure that we are providing  your outpatient care  with  the attention to detail we feel you deserve.   If we cannot assure that you're getting that kind of care,  then we cannot manage your problem effectively from this clinic.  Once you have seen Tammy and we are sure that we're all on the same page with your medication use she will arrange follow up with me.  If not successful consider laba/ICS neb with ipatropium bid and then prn ipatropium vs spiriva

## 2013-02-14 NOTE — Assessment & Plan Note (Addendum)
-   PFTs 05/04/05 FEV1 36% ratio 28% with 29% improvement after bronchodilators DLCO 48%  - PFT's 03/26/08 30% ratio 34 with 14% improvement after bronchodilators DLC0 38 %  - Referred to rehab 12/19/2012    I had an extended discussion with the patient today lasting 15 to 20 minutes of a 25 minute visit on the following issues:  - changed to spiriva respimat trial basis x 4 week sample plus continue dulera 200 2bid (insurance restriction on symbicort) - other option change to laba/lama next ov   The proper method of use, as well as anticipated side effects, of a metered-dose inhaler are discussed and demonstrated to the patient. Improved effectiveness after extensive coaching during this visit to a level of approximately  90%     Each maintenance medication was reviewed in detail including most importantly the difference between maintenance and as needed and under what circumstances the prns are to be used.  Please see instructions for details which were reviewed in writing and the patient given a copy.

## 2013-02-14 NOTE — Assessment & Plan Note (Signed)
-   Start noct 02 at 2lpm 2008 and on 03/04/09 desat @ > 185 ft so rec wear with activtiy > rm to rm  - 02/15/2011   Walked RA x one lap @ 185 stopped due to  desat > corrected on 2lpm   05/23/2012  rx =  02 2lpm at hs and with activity > room to room  Adequate control on present rx, reviewed > no change in rx needed   

## 2013-02-16 ENCOUNTER — Other Ambulatory Visit: Payer: Self-pay | Admitting: Adult Health

## 2013-02-16 ENCOUNTER — Other Ambulatory Visit: Payer: Self-pay | Admitting: Interventional Cardiology

## 2013-02-17 NOTE — Telephone Encounter (Signed)
Not on med list. Ok to refill? Thanks, MI 

## 2013-02-18 ENCOUNTER — Other Ambulatory Visit: Payer: Self-pay

## 2013-02-18 ENCOUNTER — Encounter (HOSPITAL_COMMUNITY)
Admission: RE | Admit: 2013-02-18 | Discharge: 2013-02-18 | Disposition: A | Discharge status: Home / Self Care | Payer: Medicare Other | Source: Ambulatory Visit | Attending: Internal Medicine | Admitting: Internal Medicine

## 2013-02-18 DIAGNOSIS — J449 Chronic obstructive pulmonary disease, unspecified: Secondary | ICD-10-CM | POA: Diagnosis not present

## 2013-02-18 DIAGNOSIS — Z951 Presence of aortocoronary bypass graft: Secondary | ICD-10-CM | POA: Diagnosis not present

## 2013-02-18 DIAGNOSIS — Z5189 Encounter for other specified aftercare: Secondary | ICD-10-CM | POA: Diagnosis not present

## 2013-02-18 DIAGNOSIS — J9610 Chronic respiratory failure, unspecified whether with hypoxia or hypercapnia: Secondary | ICD-10-CM | POA: Diagnosis not present

## 2013-02-18 DIAGNOSIS — I509 Heart failure, unspecified: Secondary | ICD-10-CM | POA: Diagnosis not present

## 2013-02-18 DIAGNOSIS — G4733 Obstructive sleep apnea (adult) (pediatric): Secondary | ICD-10-CM | POA: Diagnosis not present

## 2013-02-19 DIAGNOSIS — R1013 Epigastric pain: Secondary | ICD-10-CM | POA: Diagnosis not present

## 2013-02-19 DIAGNOSIS — I10 Essential (primary) hypertension: Secondary | ICD-10-CM | POA: Diagnosis not present

## 2013-02-19 DIAGNOSIS — J449 Chronic obstructive pulmonary disease, unspecified: Secondary | ICD-10-CM | POA: Diagnosis not present

## 2013-02-20 ENCOUNTER — Encounter (HOSPITAL_COMMUNITY)
Admission: RE | Admit: 2013-02-20 | Discharge: 2013-02-20 | Disposition: A | Discharge status: Home / Self Care | Payer: Medicare Other | Source: Ambulatory Visit | Attending: Internal Medicine | Admitting: Internal Medicine

## 2013-02-20 NOTE — Progress Notes (Signed)
[name] came to exercise in pulmonary rehab today with elevated blood pressure of 178/68.  Patient had been sitting for 30 minutes and this is not his normal blood pressure.  When questioned patient states he took all of his normal medicines.  His blood pressure normally is 120's-154/54-70.  Otherwise he feels fine, but is otherwise irritated that he is having to wear a telemetry monitor.  Patient was rechecked and blood pressure was down to 164/70.  He was not allowed to exercise and patient is to check blood pressure at home today and tomorrow and will report to his cardiologist if blood pressure remains elevated.

## 2013-02-21 DIAGNOSIS — N401 Enlarged prostate with lower urinary tract symptoms: Secondary | ICD-10-CM | POA: Diagnosis not present

## 2013-02-21 DIAGNOSIS — N139 Obstructive and reflux uropathy, unspecified: Secondary | ICD-10-CM | POA: Diagnosis not present

## 2013-02-25 ENCOUNTER — Encounter (HOSPITAL_COMMUNITY): Payer: Medicare Other

## 2013-02-27 ENCOUNTER — Encounter (HOSPITAL_COMMUNITY): Payer: Medicare Other

## 2013-02-28 ENCOUNTER — Telehealth: Payer: Self-pay | Admitting: Internal Medicine

## 2013-02-28 MED ORDER — MOMETASONE FURO-FORMOTEROL FUM 200-5 MCG/ACT IN AERO: 2.0000 | INHALATION_SPRAY | Freq: Two times a day (BID) | RESPIRATORY_TRACT | Status: DC

## 2013-02-28 MED ORDER — TIOTROPIUM BROMIDE MONOHYDRATE 2.5 MCG/ACT IN AERS: 2.0000 | INHALATION_SPRAY | Freq: Every day | RESPIRATORY_TRACT | Status: DC

## 2013-02-28 NOTE — Telephone Encounter (Signed)
Rx's have been called in. Pt is aware.

## 2013-03-04 ENCOUNTER — Encounter (HOSPITAL_COMMUNITY)
Admission: RE | Admit: 2013-03-04 | Discharge: 2013-03-04 | Disposition: A | Discharge status: Home / Self Care | Payer: Medicare Other | Source: Ambulatory Visit | Attending: Internal Medicine | Admitting: Internal Medicine

## 2013-03-04 DIAGNOSIS — J9610 Chronic respiratory failure, unspecified whether with hypoxia or hypercapnia: Secondary | ICD-10-CM | POA: Diagnosis not present

## 2013-03-04 DIAGNOSIS — G4733 Obstructive sleep apnea (adult) (pediatric): Secondary | ICD-10-CM | POA: Diagnosis not present

## 2013-03-04 DIAGNOSIS — J449 Chronic obstructive pulmonary disease, unspecified: Secondary | ICD-10-CM | POA: Diagnosis not present

## 2013-03-04 DIAGNOSIS — Z951 Presence of aortocoronary bypass graft: Secondary | ICD-10-CM | POA: Diagnosis not present

## 2013-03-04 DIAGNOSIS — Z5189 Encounter for other specified aftercare: Secondary | ICD-10-CM | POA: Diagnosis not present

## 2013-03-04 DIAGNOSIS — I509 Heart failure, unspecified: Secondary | ICD-10-CM | POA: Diagnosis not present

## 2013-03-05 NOTE — Psychosocial Assessment (Signed)
[name] 80 y.o. male  46 day  Psychosocial Note  Patient psychosocial assessment reveals no barriers to participation in Pulmonary Rehab.  Patient does continue to exhibit positive coping skills to deal with his psychosocial concerns. Offered emotional support and reassurance. Patient does feel he is making progress toward Pulmonary Rehab goals. Patient reports his health and activity level has improved in the past 30 days as evidenced by patient's report of increased ability to do more chores at home.  The weather has been very cold and has limited patient from walking outside at home, but he hopes to do this when the weather warms up.   Patient states family/friends have noticed changes in his activity or mood.  He was placed on a telemetry monitor by his physician to wear at home for a few weeks and this has worried him somewhat.   Patient reports  feeling positive about current and projected progression in Pulmonary Rehab. After reviewing the patient's treatment plan, the patient is making progress toward Pulmonary Rehab goals. Patient's rate of progress toward rehab goals is good. Plan of action to help patient continue to work towards rehab goals include increasing patient's workloads as appropriate.  Will continue to monitor and evaluate progress toward psychosocial goal(s).  Goal(s) in progress: Improved management of anxiety Improved coping skills Help patient work toward returning to meaningful activities that improve patient's QOL and are attainable with patient's lung disease

## 2013-03-06 ENCOUNTER — Encounter (HOSPITAL_COMMUNITY)
Admission: RE | Admit: 2013-03-06 | Discharge: 2013-03-06 | Disposition: A | Discharge status: Home / Self Care | Payer: Medicare Other | Source: Ambulatory Visit | Attending: Internal Medicine | Admitting: Internal Medicine

## 2013-03-10 NOTE — Psychosocial Assessment (Signed)
Staff MD, Rehab Director Note/Attestation  Reviewed pshyosocial assessment. Agree with goal  Laid out by the staff of rehab  Dr. Brand Males, M.D., [facility].C.P Pulmonary and Critical Care Medicine Staff Physician and Director of [facility] Pager: [phone], If no answer or between  15:00h - 7:00h: call [phone]  03/10/2013 1:54 PM

## 2013-03-11 ENCOUNTER — Telehealth: Payer: Self-pay | Admitting: Adult Health

## 2013-03-11 ENCOUNTER — Encounter (HOSPITAL_COMMUNITY)
Admission: RE | Admit: 2013-03-11 | Discharge: 2013-03-11 | Disposition: A | Discharge status: Home / Self Care | Payer: Medicare Other | Source: Ambulatory Visit | Attending: Internal Medicine | Admitting: Internal Medicine

## 2013-03-11 DIAGNOSIS — I10 Essential (primary) hypertension: Secondary | ICD-10-CM | POA: Diagnosis not present

## 2013-03-11 DIAGNOSIS — J984 Other disorders of lung: Secondary | ICD-10-CM | POA: Diagnosis not present

## 2013-03-11 DIAGNOSIS — G4733 Obstructive sleep apnea (adult) (pediatric): Secondary | ICD-10-CM | POA: Diagnosis not present

## 2013-03-11 DIAGNOSIS — J9610 Chronic respiratory failure, unspecified whether with hypoxia or hypercapnia: Secondary | ICD-10-CM | POA: Diagnosis not present

## 2013-03-11 DIAGNOSIS — Z7982 Long term (current) use of aspirin: Secondary | ICD-10-CM | POA: Insufficient documentation

## 2013-03-11 DIAGNOSIS — J449 Chronic obstructive pulmonary disease, unspecified: Secondary | ICD-10-CM | POA: Insufficient documentation

## 2013-03-11 DIAGNOSIS — I6529 Occlusion and stenosis of unspecified carotid artery: Secondary | ICD-10-CM | POA: Insufficient documentation

## 2013-03-11 DIAGNOSIS — I2510 Atherosclerotic heart disease of native coronary artery without angina pectoris: Secondary | ICD-10-CM | POA: Diagnosis not present

## 2013-03-11 DIAGNOSIS — J4489 Other specified chronic obstructive pulmonary disease: Secondary | ICD-10-CM | POA: Insufficient documentation

## 2013-03-11 DIAGNOSIS — I739 Peripheral vascular disease, unspecified: Secondary | ICD-10-CM | POA: Insufficient documentation

## 2013-03-11 DIAGNOSIS — Z5189 Encounter for other specified aftercare: Secondary | ICD-10-CM | POA: Diagnosis not present

## 2013-03-11 DIAGNOSIS — I509 Heart failure, unspecified: Secondary | ICD-10-CM | POA: Insufficient documentation

## 2013-03-11 DIAGNOSIS — Z951 Presence of aortocoronary bypass graft: Secondary | ICD-10-CM | POA: Diagnosis not present

## 2013-03-11 NOTE — Telephone Encounter (Signed)
Called spoke with patient who reported that his Symbicort has recently been changed to [place] ([employer]) and Spiriva Respimat added to his regimen at 2.6.15 ov.  Pt stated that within a few days of beginning the Spiriva his BP has been elevated (unable to supply specific values).  Pt stated that this was also elevated at Pulmonary Rehab and was unable to attend x2 days.  Pt is asking for any recommendations and/or thoughts regarding this prior to his ov w/ TP on 3.6.15.  Tammy please advise, thank you.

## 2013-03-12 NOTE — Telephone Encounter (Signed)
Called spoke with patient, advised of TP's recs as stated below.  Pt verbalized his understanding and reported that he is not taking any decongestants or NSAIDS.  Pt will call his PCP regarding his BP issues.  Nothing further needed at this time; will sign off.

## 2013-03-12 NOTE — Telephone Encounter (Signed)
These should not raise b/p  Make sure not decongestants ie sudafed or NSAIDS -advil, aleve, ibuprofen, etc  Would call PCP for b/p issues

## 2013-03-13 ENCOUNTER — Encounter (HOSPITAL_COMMUNITY)
Admission: RE | Admit: 2013-03-13 | Discharge: 2013-03-13 | Disposition: A | Discharge status: Home / Self Care | Payer: Medicare Other | Source: Ambulatory Visit | Attending: Internal Medicine | Admitting: Internal Medicine

## 2013-03-13 ENCOUNTER — Telehealth (HOSPITAL_COMMUNITY): Payer: Self-pay | Admitting: *Deleted

## 2013-03-14 ENCOUNTER — Ambulatory Visit (INDEPENDENT_AMBULATORY_CARE_PROVIDER_SITE_OTHER): Payer: Medicare Other | Admitting: Adult Health

## 2013-03-14 ENCOUNTER — Encounter: Payer: Self-pay | Admitting: Adult Health

## 2013-03-14 VITALS — BP 136/78 | HR 78 | Temp 97.8°F | Ht 69.0 in | Wt 148.6 lb

## 2013-03-14 DIAGNOSIS — J9610 Chronic respiratory failure, unspecified whether with hypoxia or hypercapnia: Secondary | ICD-10-CM | POA: Diagnosis not present

## 2013-03-14 DIAGNOSIS — J449 Chronic obstructive pulmonary disease, unspecified: Secondary | ICD-10-CM | POA: Diagnosis not present

## 2013-03-14 DIAGNOSIS — J4489 Other specified chronic obstructive pulmonary disease: Secondary | ICD-10-CM

## 2013-03-14 NOTE — Progress Notes (Signed)
Spoke with pt and notified of recs per MW He verbalized understanding and rx was sent to pharm  Subjective:    Patient ID: [name], male    DOB: 1929/04/20   MRN: [id number]     Summary: Pulmonary/ f/u ov try astepro for pnds   Primary Provider/Referring Provider: [name]    Brief patient profile:  83   yowm last smoked 1983 severe COPD FEV1 of only 36% April 2007 with a 29% improvement after bronchodilators consistent with an asthmatic component. Baseline activity level--walk 1 mile daily with oxygen on. Maintained on Symbicort 160/4.50mcg 1 puff two times a day.    History of Present Illness  February 11, 2008 ov never took spiriva consistently. No variability to doe or sign cough. rec spiriva but w/in one week cp > MCH with neg cardiac wu, still having occ cp , positional, better with rubbing chest and relaxing.  Retry spiriva   October 15, 2008 rov follow up, about the same did not get along well with spiriva , discont. taking caused dry mouth. still definitely benefits from as needed xopenex.    February 18, 2010 -- ov cc cough with green/white mucus, wheezing, SOB x5days. OTC not helping. Cough is worse at night, keeping him up at times. Wheezing started yesterday. rex doxy, prednisone then admit 2/14 to [facility] and d/c 2/17 with ct c/w emphysema > d/c on zmax and prednisone tapered off as of March 03, 2010   March 03, 2010 ov cc  minimally improved sob and cough with green sputum and nasal congestion esp in am .. doex > slow adls  minimally better p nebs rec  See Patient Care Coordinator before leaving for sinus CT  Thickening only  Prilosec otc 20mg  30 min before bfast and zantac 150 at bedtime as long as coughing ( reflux is to cough what oxygen is to fire)  Augmentin 875 mg twice daily with water and eat yogurt for lunch  Prednisone 10mg  2 daily back to normal then 1 daily until return.   March 17, 2010 ov Cough and Dyspnea- some better, mucus is now white and less volume  worse lying down, less need for rescue.  rec taper off prednisone >  5 days off  04/28/2010 ov/Wert no worse off prednisone,  Rare astepro, no need for rescue or even using the atrovent/xop more than twice dailly - overall much better.   rec Ipatropium / xopenex are twice regulary with ability to increase to 4 x daily if breathing worsens   10/14/2010 f/u ov/Wert cc Pt states breathing worse with exertion x 1 month. Still has no cough but lots of mucus esp in am with  No purulent sputum. ent wants to try ipatropium and scopalamine to dry up his drippy nose - no longer following med calendar for action plan at bottom rec Change ipatropium to 4 x daily and just use Only use your xopenex as a rescue medication to be used if you can't catch your breath by resting or doing a relaxed purse lip breathing pattern. The less you use it, the better it will work when you need it.   Prednisone 10 mg take  4 each am x 2 days,   2 each am x 2 days,  1 each am x2days and stop   Please schedule a follow up office visit in 6 weeks, call sooner if needed but bring the medication calendar with you   11/25/2010 f/u ov/Wert cc breathing better, nasal drainage better.minimal  cough. Doe at [place].minimal use of rescue   rec Follow med calendar  03/10/2011 Follow up and Med review  Returns for follow up and med review. We reviewed his meds and organized them into a med calendar with pt educaiton.   rec  05/01/2011 f/u ov/Wert cc 50 ft outside, one aisle at [employer], whole store with 02. No cough and minimal need for saba  Seen at [facility] rec change ipatropium neb  to atrovent > no change doe. rec ry tudorza one puff twice daily and stop atrovent and see how it effects your activity performance, if not benefit ok to change back to atrovent   10/04/2011 f/u ov/Wert did not bring med calendar cc  Loosing ground on tudorza so went to atrovent neb and seemed to help. rec Plan A = use automatically no matter what Symbicort  followed by Atrovent each am and symbiocort again in 12 hours plus continue the atrovent though the day in between about every 4-6 hours  Only use your albuterol (xopenex hfa -puffer-  is Plan B,  Nebulizer is plan C) as a rescue medication to be used if you can't catch your breath by resting or doing a relaxed purse lip breathing pattern. The less you use it, the better it will work when you need it.      Late add: Struggling a bit with implementation of action plans, need to review med calendar next ov   12/21/2011 f/u ov/Wert cc doe loosing ground = one aisle slow off 02,  Does ok on 02 at grocery, but even on 02 on treadmill at usual grade (flat) and speed (1.4 mph) on 02 x 20 min where used to to 1.5 x 30 min.  qnasal helped nasal discharge but increased bp so stopped it and drainage came back. rec Restart qnasal and monitor your blood pressure  Always wear 02 2lpm at bedtime and when walking more than room to room at home    01/29/2012 f/u ov/Wert cc slt better doe, fought off cold ok x sev weeks prior to OV  and back to rare if ever use xopenex  maint complaint is persistent x years watery rhinitis worse with cold air exp and with eating, no better on qnasal.     No obvious daytime variabilty or assoc chronic cough or cp or chest tightness, subjective wheeze overt sinus or hb symptoms. No unusual exp hx  >chlortrimeton As needed  > did not try   04/05/2012 Acute OV  Complains of  increased SOB, some wheezing and tightness in chest x3 weeks. Patient denies any discolored mucus, or fever, chest pain, orthopnea. Has had increased lower extremity swelling. Has been worse over the last few weeks. Did try Lasix 20 mg daily, but did not see much improvement. Patient denies any calf pain, hemoptysis, or known injury. Has had quite a bit of nasal congestion, and drainage. He denies sinus pain or pressure. rec Lasix 20mg   2 tabs daily for 3 days then As needed  For leg swelling.       05/22/2012 f/u ov/Wert copd/ resp failure on 02 2lpm sleeping and with activity Chief Complaint  Patient presents with  . Follow-up    Breathing improved since the last visit, not back at his baseline yet. No new co's today.   Sense of pnds worst in am never expectorates, better for only 5 min p "does his trick" to clear his throat. Rare use of xopenex hfa daytime, never needs neb saba rec  Take zantac 150 mg and chlortrimeton 4 mg at bedtime  To see what effect if any it has on your am congestion in your throat and nose   09/30/2012 f/u ov/Wert re: copd/02 dep  Chief Complaint  Patient presents with  . Follow-up    Pt states breathing worse for the past 5 wks. He states that he gets SOB and dizzy with minimal exertion.    dizzy ? Since started Zmax at [facility]. Doe x 100 ft even on 02, less cough on rx Confused with action plan meds even though read him med caledar back to me correctly   >>pred taper   10/15/2012 Follow up and Med review  Patient returns for a followup and medication review. Reviewed all his medications and organized them into a medication calendar with patient education. Appears the patient is taking his medications correctly. Patient requests a referral to allergist. Today. He complains of persistent nasal congestion, and drainage. We discussed using saline nasal rinses and saline gel to help with his nasal dryness. Due to oxygen. Use. Patient denies any hemoptysis, orthopnea, PND, or leg swelling. Last visit. Patient was having a flare. COPD, was treated with a prednisone taper, which is now resolved. Patient's breathing is back to baseline. rec Prednisone 10 mg take  4 each am x 2 days,   2 each am x 2 days,  1 each am x 2 days and stop  Stop zithromax Plan A symbiocort and Ipatropium Plan B = backup > xopenex 2 every 4 if needed Plan C = xopenex neb every 4 hours if needed   12/18/2012 f/u ov/Wert re: copd/ 02  at bedtime and with  exertion  Chief Complaint  Patient presents with  . Follow-up    Increased sob at all times x1 month.  using O2 more frequently   actually no sob at rest or at hs, wears 02 with activity and when he does wear it the sats are fine even when he has to stop due to sob rec Plan A= automatic  symbiocort and tudorza twice daily and ok to use your 02 more consistently with activity that you know makes you short of breath Plan B = backup > xopenex 2 every 4 if needed if can't catch your breath Plan C = xopenex neb every 4 hours if plan B not working, always try plan B first  Please see patient coordinator before you leave today  to schedule rehab  02/14/2013 f/u ov/Wert re:  Chief Complaint  Patient presents with  . Follow-up    Wants to make sure his medications are correct.  Pt c/o increased SOB with exertion.  easily confused with details of care, only using hfa xopenex not the neb xopenex, times right before he goes to rehab and feels he's making slow progress.  >>changed Symbicort to [facility] and atrovent neb to spiriva respimat  03/14/2013 Follow up  Returns for a followup for COPD. Last visit. Patient was changed over from Symbicort to [facility]   and he was also changed over from Atrovent nebulizers to Spiriva. Denies any wheezing, tightness, f/c/s.  Does feel that he has good and bad days.  He has started pulmonary rehabilitation classes. He denies a flare of cough or shortness of breath.   Current Medications, Allergies, Complete Past Medical History, Past Surgical History, Family History, and Social History were reviewed in [employer] record.  ROS  The following are not active complaints unless bolded sore throat,  dysphagia, dental problems, itching, sneezing,  nasal congestion or excess/ purulent secretions, ear ache,   fever, chills, sweats, unintended wt loss, pleuritic or exertional cp, hemoptysis,  orthopnea pnd or leg swelling, presyncope, palpitations,  heartburn, abdominal pain, anorexia, nausea, vomiting, diarrhea  or change in bowel or urinary habits, change in stools or urine, dysuria,hematuria,  rash, arthralgias, visual complaints, headache, numbness weakness or ataxia or problems with walking or coordination,  change in mood/affect or memory.        Past Medical History:  CHRONIC RHINITIS (ICD-472.0)  - Sinus ct March 03, 2010 > thickening only, no acute changes  CHRONIC RESPIRATORY FAILURE (ICD-518.83)  - 02 rx since 2008  COPD UNSPECIFIED (ICD-496)....................................................................[name]  - PFTs 05/04/05 FEV1 36% ratio 28% with 29% improvement after bronchodilators DLCO 48%  - PFT's 03/26/08 30% ratio 34 with 14% improvement after bronchodilators DLC0 38 %  - Start noct 02 at 2lpm 2008 and on 03/04/09 desat @ > 185 ft so rec wear with activtiy > rm to rm  - [facility] June 30, 2008 100% > confirmed March 04, 2009  ISCHEMIC HEART DISEASE (ICD-414.9)  HYPERTENSION (ICD-401.9)  HYPERLIPIDEMIA (ICD-272.4)  SPN RUL by cxr August 19, 2009  COMPLEX MED REGIMEN-Meds reviewed with pt education and computerized med calendar 11/22/09 03/10/2011  , 10/15/2012           Objective:   Physical Exam  amb wm nad with nl vitals  wt 159 04/28/2010  > 10/14/2010 154  > 10/04/2011 150> 12/21/2011  150 > 149 01/29/2012 >154 04/05/2012 > 05/22/2012 148 > 09/30/2012  146 >>144> 10/15/2012 > 12/18/2012  145 > 148 02/14/2013 >148 03/14/2013   HEENT mild turbinate edema.  Oropharynx no thrush or excess pnd or cobblestoning.  No JVD or cervical adenopathy. Mild accessory muscle hypertrophy. Trachea midline, nl thryroid. Chest was hyperinflated by percussion with diminished breath sounds and moderate increased exp time without wheeze.   Regular rate and rhythm without murmur gallop or rub or increase P2 or edema.  Abd: no hsm, nl excursion. Ext warm without cyanosis or clubbing.       04/08/12 cxr  No active disease. No significant  change. Mild hyperinflation.    Assessment & Plan:

## 2013-03-14 NOTE — Assessment & Plan Note (Signed)
Compensated on present regimen. Patient's continue with pulmonary rehab Followup in 3 months and as needed

## 2013-03-14 NOTE — Patient Instructions (Signed)
Continue on current regimen   Follow up with Dr. Wert in 3 months and as needed.       

## 2013-03-14 NOTE — Assessment & Plan Note (Signed)
Continue on oxygen 

## 2013-03-14 NOTE — Addendum Note (Signed)
Addended by: Parke Poisson E on: 03/14/2013 04:05 PM   Modules accepted: Orders

## 2013-03-18 ENCOUNTER — Encounter (HOSPITAL_COMMUNITY)
Admission: RE | Admit: 2013-03-18 | Discharge: 2013-03-18 | Disposition: A | Discharge status: Home / Self Care | Payer: Medicare Other | Source: Ambulatory Visit | Attending: Internal Medicine | Admitting: Internal Medicine

## 2013-03-20 ENCOUNTER — Encounter (HOSPITAL_COMMUNITY)
Admission: RE | Admit: 2013-03-20 | Discharge: 2013-03-20 | Disposition: A | Discharge status: Home / Self Care | Payer: Medicare Other | Source: Ambulatory Visit | Attending: Internal Medicine | Admitting: Internal Medicine

## 2013-03-21 DIAGNOSIS — R0609 Other forms of dyspnea: Secondary | ICD-10-CM | POA: Diagnosis not present

## 2013-03-21 DIAGNOSIS — E785 Hyperlipidemia, unspecified: Secondary | ICD-10-CM | POA: Diagnosis not present

## 2013-03-21 DIAGNOSIS — I119 Hypertensive heart disease without heart failure: Secondary | ICD-10-CM | POA: Diagnosis not present

## 2013-03-21 DIAGNOSIS — J441 Chronic obstructive pulmonary disease with (acute) exacerbation: Secondary | ICD-10-CM | POA: Diagnosis not present

## 2013-03-21 DIAGNOSIS — I739 Peripheral vascular disease, unspecified: Secondary | ICD-10-CM | POA: Diagnosis not present

## 2013-03-21 DIAGNOSIS — R5383 Other fatigue: Secondary | ICD-10-CM | POA: Diagnosis not present

## 2013-03-21 DIAGNOSIS — R5381 Other malaise: Secondary | ICD-10-CM | POA: Diagnosis not present

## 2013-03-21 DIAGNOSIS — R0989 Other specified symptoms and signs involving the circulatory and respiratory systems: Secondary | ICD-10-CM | POA: Diagnosis not present

## 2013-03-21 DIAGNOSIS — I4949 Other premature depolarization: Secondary | ICD-10-CM | POA: Diagnosis not present

## 2013-03-21 DIAGNOSIS — I2510 Atherosclerotic heart disease of native coronary artery without angina pectoris: Secondary | ICD-10-CM | POA: Diagnosis not present

## 2013-03-21 MED ORDER — MOMETASONE FURO-FORMOTEROL FUM 200-5 MCG/ACT IN AERO: 2.0000 | INHALATION_SPRAY | Freq: Two times a day (BID) | RESPIRATORY_TRACT | Status: DC

## 2013-03-21 MED ORDER — TIOTROPIUM BROMIDE MONOHYDRATE 2.5 MCG/ACT IN AERS: 2.0000 | INHALATION_SPRAY | Freq: Every day | RESPIRATORY_TRACT | Status: DC

## 2013-03-21 NOTE — Addendum Note (Signed)
Addended by: Parke Poisson E on: 03/21/2013 03:38 PM   Modules accepted: Orders, Medications

## 2013-03-25 ENCOUNTER — Encounter (HOSPITAL_COMMUNITY): Payer: Self-pay

## 2013-03-25 ENCOUNTER — Encounter (HOSPITAL_COMMUNITY)
Admission: RE | Admit: 2013-03-25 | Discharge: 2013-03-25 | Disposition: A | Discharge status: Home / Self Care | Payer: Medicare Other | Source: Ambulatory Visit | Attending: Internal Medicine | Admitting: Internal Medicine

## 2013-03-25 NOTE — Progress Notes (Signed)
Today, [name] exercised at [employer].  The patient exercised for more than 31 minutes on different pieces of equipment. Oxygen saturation, heart rate, blood pressure, rate of perceived exertion, and shortness of breath were all monitored before, during, and after exercise. [name] presented with no problems at today's exercise session.    Dr. Brand Males, Medical Director Dr. Dyann Kief is immediately available during today's Pulmonary Rehab session for [name].

## 2013-03-25 NOTE — Psychosocial Assessment (Signed)
[name] 80 y.o. male  48 day  Psychosocial Note  Patient psychosocial assessment reveals barriers to participation in Pulmonary Rehab.  Patient does feel he is making progress toward Pulmonary Rehab goals. Patient reports his health and activity level has improved in the past 30 days as evidenced by patient's report of increased ability to do more chores at home and he feels "stronger". Patient states family/friends have not noticed changes in his activity or mood. Patient reports feeling positive about current and projected progression in Pulmonary Rehab. After reviewing the patient's treatment plan, the patient is making progress toward Pulmonary Rehab goals. Patient's rate of progress toward rehab goals is fair. Plan of action to help patient continue to work towards rehab goals include increasing workloads as appropriate to increase strength and endurance.  Also, encourage purse-lip breathing.   Will continue to monitor and evaluate progress toward psychosocial goal(s).  Patient has attended 16 exercise sessions since beginning the program.  Goal(s) in progress: Increase workloads as appropriate Encourage purse-lip breathing Help patient work toward returning to meaningful activities that improve patient's QOL and are attainable with patient's lung disease

## 2013-03-25 NOTE — Psychosocial Assessment (Signed)
Staff MD, Rehab Director Note/Attestation  Reviewed pshyosocial assessment. Agree with goal  Laid out by the staff of rehab  Dr. Raisha Brabender, M.D., F.C.C.P Pulmonary and Critical Care Medicine Staff Physician and Director of Pulmonary Rehabilitation Services [facility] Pager: [phone], If no answer or between  15:00h - 7:00h: call [phone]  03/25/2013 6:10 PM   

## 2013-03-27 ENCOUNTER — Encounter (HOSPITAL_COMMUNITY)
Admission: RE | Admit: 2013-03-27 | Discharge: 2013-03-27 | Disposition: A | Discharge status: Home / Self Care | Payer: Medicare Other | Source: Ambulatory Visit | Attending: Internal Medicine | Admitting: Internal Medicine

## 2013-03-27 ENCOUNTER — Encounter (HOSPITAL_COMMUNITY): Payer: Self-pay | Admitting: *Deleted

## 2013-03-27 NOTE — Progress Notes (Signed)
Today, [name] exercised at [employer].  The patient exercised for more than 31 minutes on different pieces of equipment. Oxygen saturation, heart rate, blood pressure, rate of perceived exertion, and shortness of breath were all monitored before, during, and after exercise. [name] presented with no problems at today's exercise session.   Patient attended MD lecture day with Dr. Alva Garnet. Dr. Brand Males, Medical Director Dr. Aileen Fass is immediately available during today's Pulmonary Rehab session for [name].

## 2013-03-27 NOTE — Progress Notes (Signed)
Patient actually did not attend the MD lecture today.

## 2013-04-01 ENCOUNTER — Encounter (HOSPITAL_COMMUNITY)
Admission: RE | Admit: 2013-04-01 | Discharge: 2013-04-01 | Disposition: A | Discharge status: Home / Self Care | Payer: Medicare Other | Source: Ambulatory Visit | Attending: Internal Medicine | Admitting: Internal Medicine

## 2013-04-01 NOTE — Progress Notes (Signed)
Today, [name] exercised at [employer]. Service time was from 1400 to 1530.  The patient exercised for more than 31 minutes on different pieces of equipment. [name] also performed strength training for 10 minutes.  Oxygen saturation, heart rate, blood pressure, rate of perceived exertion, and shortness of breath were all monitored before, during, and after exercise. [name] presented with no problems at today's exercise session.   Pre-exercise vitals:   Weight kg: 66.7   Liters of O2: 2   SpO2: 99   HR: 64   BP: 128/58   CGB: NA  Exercise vitals:   Highest heartrate:  76   Lowest oxygen saturation: 92   Highest blood pressure: 136/60   Liters of O2: 2  Post-exercise vitals:   SpO2: 100   HR: 65   BP: 100/64   CGB: NA Dr. Brand Males, Medical Director Dr. Aileen Fass is immediately available during today's Pulmonary Rehab session for [name].

## 2013-04-03 ENCOUNTER — Encounter (HOSPITAL_COMMUNITY)
Admission: RE | Admit: 2013-04-03 | Discharge: 2013-04-03 | Disposition: A | Discharge status: Home / Self Care | Payer: Medicare Other | Source: Ambulatory Visit | Attending: Internal Medicine | Admitting: Internal Medicine

## 2013-04-03 ENCOUNTER — Encounter (HOSPITAL_COMMUNITY): Payer: Self-pay | Admitting: *Deleted

## 2013-04-03 NOTE — Progress Notes (Signed)
Today, [name] exercised at [employer]. Service time was from 1330 to 1530.  The patient exercised for more than 31 minutes on different pieces of equipment. [name] also performed strength training for 10 minutes.  Oxygen saturation, heart rate, blood pressure, rate of perceived exertion, and shortness of breath were all monitored before, during, and after exercise. [name] presented with no problems at today's exercise session. Patient attended nutrition lecture.  Pre-exercise vitals:   Weight kg: 66.5   Liters of O2: 2   SpO2: 98   HR: 74   BP: 128/52   CGB: NA  Exercise vitals:   Highest heartrate:  82   Lowest oxygen saturation: 97   Highest blood pressure: 128/52   Liters of O2: 2  Post-exercise vitals:   SpO2: 100   HR: 60   BP: 120/60   CGB: NA Dr. Brand Males, Medical Director Dr. Ree Kida is immediately available during today's Pulmonary Rehab session for [name].

## 2013-04-08 ENCOUNTER — Encounter (HOSPITAL_COMMUNITY): Payer: Self-pay

## 2013-04-08 ENCOUNTER — Encounter (HOSPITAL_COMMUNITY)
Admission: RE | Admit: 2013-04-08 | Discharge: 2013-04-08 | Disposition: A | Discharge status: Home / Self Care | Payer: Medicare Other | Source: Ambulatory Visit | Attending: Internal Medicine | Admitting: Internal Medicine

## 2013-04-08 NOTE — Progress Notes (Signed)
Today, [name] exercised at [employer]. Service time was from 1:30pm to 3:30pm.  The patient exercised for more than 31 minutes performing aerobic, strengthening, and stretching exercises. Oxygen saturation, heart rate, blood pressure, rate of perceived exertion, and shortness of breath were all monitored before, during, and after exercise. [name] presented with no problems at today's exercise session.   Pre-exercise vitals:   Weight kg: 66.8   Liters of O2: 2   SpO2: 96   HR: 72   BP: 120/52   CBG: na  Exercise vitals:   Highest heartrate:  78   Lowest oxygen saturation: 97   Highest blood pressure: 110/58   Liters of 02: 2  Post-exercise vitals:   SpO2: 97   HR: 59   BP: 116/64   Liters of O2: 2   CBG: na  Dr. Brand Males, Medical Director Dr. Ree Kida is immediately available during today's Pulmonary Rehab session for [name] on 04/08/13 at 1:30pm class time.

## 2013-04-10 ENCOUNTER — Encounter (HOSPITAL_COMMUNITY)
Admission: RE | Admit: 2013-04-10 | Discharge: 2013-04-10 | Disposition: A | Discharge status: Home / Self Care | Payer: Medicare Other | Source: Ambulatory Visit | Attending: Internal Medicine | Admitting: Internal Medicine

## 2013-04-10 ENCOUNTER — Encounter (HOSPITAL_COMMUNITY): Payer: Self-pay

## 2013-04-10 DIAGNOSIS — J449 Chronic obstructive pulmonary disease, unspecified: Secondary | ICD-10-CM | POA: Insufficient documentation

## 2013-04-10 DIAGNOSIS — I509 Heart failure, unspecified: Secondary | ICD-10-CM | POA: Diagnosis not present

## 2013-04-10 DIAGNOSIS — I739 Peripheral vascular disease, unspecified: Secondary | ICD-10-CM | POA: Insufficient documentation

## 2013-04-10 DIAGNOSIS — I2510 Atherosclerotic heart disease of native coronary artery without angina pectoris: Secondary | ICD-10-CM | POA: Diagnosis not present

## 2013-04-10 DIAGNOSIS — Z951 Presence of aortocoronary bypass graft: Secondary | ICD-10-CM | POA: Diagnosis not present

## 2013-04-10 DIAGNOSIS — Z7982 Long term (current) use of aspirin: Secondary | ICD-10-CM | POA: Diagnosis not present

## 2013-04-10 DIAGNOSIS — J4489 Other specified chronic obstructive pulmonary disease: Secondary | ICD-10-CM | POA: Insufficient documentation

## 2013-04-10 DIAGNOSIS — J9610 Chronic respiratory failure, unspecified whether with hypoxia or hypercapnia: Secondary | ICD-10-CM | POA: Insufficient documentation

## 2013-04-10 DIAGNOSIS — I6529 Occlusion and stenosis of unspecified carotid artery: Secondary | ICD-10-CM | POA: Diagnosis not present

## 2013-04-10 DIAGNOSIS — J984 Other disorders of lung: Secondary | ICD-10-CM | POA: Insufficient documentation

## 2013-04-10 DIAGNOSIS — G4733 Obstructive sleep apnea (adult) (pediatric): Secondary | ICD-10-CM | POA: Diagnosis not present

## 2013-04-10 DIAGNOSIS — I10 Essential (primary) hypertension: Secondary | ICD-10-CM | POA: Insufficient documentation

## 2013-04-10 DIAGNOSIS — Z5189 Encounter for other specified aftercare: Secondary | ICD-10-CM | POA: Diagnosis not present

## 2013-04-10 NOTE — Telephone Encounter (Signed)
Closing encounter

## 2013-04-10 NOTE — Progress Notes (Signed)
Today, [name] exercised at [employer]. Service time was from 1:30 to 3:30.  The patient exercised for more than 31 minutes performing aerobic, strengthening, and stretching exercises. Oxygen saturation, heart rate, blood pressure, rate of perceived exertion, and shortness of breath were all monitored before, during, and after exercise. [name] presented with no problems at today's exercise session.   There was no workload change during today's exercise session.  Pre-exercise vitals:   Weight kg: 67.0   Liters of O2: 2   SpO2: 98   HR: 67   BP: 124/64   CBG: na  Exercise vitals:   Highest heartrate:  80   Lowest oxygen saturation: 98   Highest blood pressure: 122/70   Liters of 02: 2  Post-exercise vitals:   SpO2: 100   HR: 62   BP: 116/56   Liters of O2: 2   CBG: na  Dr. Brand Males, Medical Director Dr. Aileen Fass is immediately available during today's Pulmonary Rehab session for [name] on 04/10/13 at 1:30pm class time.

## 2013-04-15 ENCOUNTER — Encounter (HOSPITAL_COMMUNITY)
Admission: RE | Admit: 2013-04-15 | Discharge: 2013-04-15 | Disposition: A | Discharge status: Home / Self Care | Payer: Medicare Other | Source: Ambulatory Visit | Attending: Internal Medicine | Admitting: Internal Medicine

## 2013-04-15 ENCOUNTER — Encounter (HOSPITAL_COMMUNITY): Payer: Self-pay | Admitting: *Deleted

## 2013-04-15 NOTE — Progress Notes (Signed)
Today, [name] exercised at [employer]. Service time was from 1330 to 1530.  The patient exercised for more than 31 minutes performing aerobic, strengthening, and stretching exercises. Oxygen saturation, heart rate, blood pressure, rate of perceived exertion, and shortness of breath were all monitored before, during, and after exercise. [name] presented with no problems at today's exercise session.   There was a workload change during today's exercise session.  Pre-exercise vitals:   Weight kg: 67.8   Liters of O2: 2   SpO2: 98   HR: 68   BP: 142/60   CBG: NA  Exercise vitals:   Highest heartrate:  80   Lowest oxygen saturation: 94   Highest blood pressure: 142/60   Liters of 02: 2  Post-exercise vitals:   SpO2: 98   HR: 62   BP: 140/60   Liters of O2: 2   CBG: NA Dr. Brand Males, Medical Director Dr. Aileen Fass is immediately available during today's Pulmonary Rehab session for [name] on 04/15/2013 at 1:30 pm class time.

## 2013-04-17 ENCOUNTER — Other Ambulatory Visit: Payer: Self-pay | Admitting: Interventional Cardiology

## 2013-04-17 ENCOUNTER — Encounter (HOSPITAL_COMMUNITY): Payer: Self-pay

## 2013-04-17 ENCOUNTER — Encounter (HOSPITAL_COMMUNITY)
Admission: RE | Admit: 2013-04-17 | Discharge: 2013-04-17 | Disposition: A | Discharge status: Home / Self Care | Payer: Medicare Other | Source: Ambulatory Visit | Attending: Internal Medicine | Admitting: Internal Medicine

## 2013-04-17 NOTE — Progress Notes (Signed)
Today, [name] exercised at [employer]. Service time was from 1:30 to 3:30.  The patient exercised for more than 31 minutes performing aerobic, strengthening, and stretching exercises. Oxygen saturation, heart rate, blood pressure, rate of perceived exertion, and shortness of breath were all monitored before, during, and after exercise. [name] presented with no problems at today's exercise session. The patient attended "Advanced Directives" today with [name].  There was no workload change during today's exercise session.  Pre-exercise vitals:   Weight kg: 67.3   Liters of O2: 2   SpO2: 97   HR: 79   BP: 144/60   CBG: na  Exercise vitals:   Highest heartrate:  80   Lowest oxygen saturation: 95   Highest blood pressure: 130/66   Liters of 02: 2  Post-exercise vitals:   SpO2: 98   HR: 63   BP: 109/67   Liters of O2: 2   CBG: na  Dr. Brand Males, Medical Director Dr. Dyann Kief is immediately available during today's Pulmonary Rehab session for [name] on 04/17/13 at 1:30 class time.

## 2013-04-18 DIAGNOSIS — K3189 Other diseases of stomach and duodenum: Secondary | ICD-10-CM | POA: Diagnosis not present

## 2013-04-18 DIAGNOSIS — R1013 Epigastric pain: Secondary | ICD-10-CM | POA: Diagnosis not present

## 2013-04-21 NOTE — Psychosocial Assessment (Signed)
[name] 80 y.o. male  120 day and Final Psychosocial Note  Patient psychosocial assessment reveals no barriers to participation in Pulmonary Rehab.  Patient does not feel he is and has made great progress toward Pulmonary Rehab goals.  He still has shortness of breath with activity, but the purse-lip breathing has helped him recover quicker.  His endurance and strength have not improved over the last 30 days.  We have not been able to greatly increase his workloads on the exercise equipment during the 3 1/2 months in the program due to him having bad breathing days.  When asked if he is ready to advance his workloads he states he is doing all he can tolerate.  This has hindered his advancement in the program.  He has attended many of the education sessions and he seems to have increased his knowledge of COPD and when to call the doctor. He has learned how to use a pulse oximeter and what is a normal oxygen saturation level.  Patient reports  feeling positive about his progression in Pulmonary Rehab. After reviewing the patient's treatment plan, the patient has made progress toward Pulmonary Rehab goals. Patient's rate of progress toward rehab goals is fair. Patient has 2 more exercise session and at that time he will graduate from our program.  We will give him an exercise plan for home upon graduating.    Goal(s) Accomplished: Patient has gained his strength back from a recent hospitalization Patient is unclear at this point what his exercise plans will be upon graduation More knowledge regarding COPD, when to call the doctor, purse-lip and diaphragmatic breathing

## 2013-04-22 ENCOUNTER — Encounter (HOSPITAL_COMMUNITY)
Admission: RE | Admit: 2013-04-22 | Discharge: 2013-04-22 | Disposition: A | Discharge status: Home / Self Care | Payer: Medicare Other | Source: Ambulatory Visit | Attending: Internal Medicine | Admitting: Internal Medicine

## 2013-04-22 ENCOUNTER — Encounter (HOSPITAL_COMMUNITY): Payer: Self-pay

## 2013-04-22 NOTE — Progress Notes (Signed)
Today, [name] exercised at [employer]. [facility]. Service time was from 1:30 to 3:30.  The patient exercised for more than 31 minutes performing aerobic, strengthening, and stretching exercises. Oxygen saturation, heart rate, blood pressure, rate of perceived exertion, and shortness of breath were all monitored before, during, and after exercise. [name] presented with no problems at today's exercise session.   There was no workload change during today's exercise session.  Pre-exercise vitals:   Weight kg: 67.3   Liters of O2: 2   SpO2: 97   HR: 89   BP: 130/68   CBG: na  Exercise vitals:   Highest heartrate:  77   Lowest oxygen saturation: 97   Highest blood pressure: 140/80   Liters of 02: 2  Post-exercise vitals:   SpO2: 99   HR: 63   BP: 118/78   Liters of O2: 2   CBG: na  Dr. Brand Males, Medical Director Dr. Dyann Kief is immediately available during today's Pulmonary Rehab session for [name] on 04/22/13 at 1:30pm class time.

## 2013-04-23 DIAGNOSIS — D485 Neoplasm of uncertain behavior of skin: Secondary | ICD-10-CM | POA: Diagnosis not present

## 2013-04-23 DIAGNOSIS — C4492 Squamous cell carcinoma of skin, unspecified: Secondary | ICD-10-CM

## 2013-04-23 DIAGNOSIS — L570 Actinic keratosis: Secondary | ICD-10-CM | POA: Diagnosis not present

## 2013-04-23 DIAGNOSIS — D045 Carcinoma in situ of skin of trunk: Secondary | ICD-10-CM | POA: Diagnosis not present

## 2013-04-23 HISTORY — DX: Squamous cell carcinoma of skin, unspecified: C44.92

## 2013-04-23 NOTE — Psychosocial Assessment (Signed)
Staff MD, Rehab Director Note/Attestation  Reviewed pshyosocial assessment. Agree with goal  Laid out by the staff of rehab. End of program and much improved  Dr. Brand Males, M.D., [facility].C.P Pulmonary and Critical Care Medicine Staff Physician and Director of [place] Pager: [phone], If no answer or between  15:00h - 7:00h: call [phone]  04/23/2013 8:44 AM

## 2013-04-24 ENCOUNTER — Encounter (HOSPITAL_COMMUNITY): Payer: Self-pay

## 2013-04-24 ENCOUNTER — Encounter (HOSPITAL_COMMUNITY)
Admission: RE | Admit: 2013-04-24 | Discharge: 2013-04-24 | Disposition: A | Discharge status: Home / Self Care | Payer: Medicare Other | Source: Ambulatory Visit | Attending: Internal Medicine | Admitting: Internal Medicine

## 2013-04-24 NOTE — Progress Notes (Signed)
Today, [name] exercised at [employer]. [facility]. Service time was from 1:30 to 3:30.  The patient exercised for more than 31 minutes performing aerobic, strengthening, and stretching exercises. Oxygen saturation, heart rate, blood pressure, rate of perceived exertion, and shortness of breath were all monitored before, during, and after exercise. [name] presented with no problems at today's exercise session.   There was no workload change during today's exercise session.  Pre-exercise vitals:   Weight kg: 66.7   Liters of O2: 2   SpO2: 98   HR: 77   BP: 154/68   CBG: na  Exercise vitals:   Highest heartrate:  75   Lowest oxygen saturation: 96   Highest blood pressure: 124/60   Liters of 02: 2  Post-exercise vitals:   SpO2: 100   HR: 61   BP: 112/56   Liters of O2: 2   CBG: na  Dr. Brand Males, Medical Director Dr. Aileen Fass is immediately available during today's Pulmonary Rehab session for [name] on 04/24/13 at 1:30pm class time.

## 2013-04-25 ENCOUNTER — Encounter: Payer: Self-pay | Admitting: Internal Medicine

## 2013-04-25 ENCOUNTER — Ambulatory Visit (INDEPENDENT_AMBULATORY_CARE_PROVIDER_SITE_OTHER): Payer: Medicare Other | Admitting: Internal Medicine

## 2013-04-25 ENCOUNTER — Ambulatory Visit (INDEPENDENT_AMBULATORY_CARE_PROVIDER_SITE_OTHER)
Admission: RE | Admit: 2013-04-25 | Discharge: 2013-04-25 | Disposition: A | Discharge status: Home / Self Care | Payer: Medicare Other | Source: Ambulatory Visit | Attending: Internal Medicine | Admitting: Internal Medicine

## 2013-04-25 VITALS — BP 130/62 | HR 75 | Temp 97.9°F | Ht 69.0 in | Wt 148.0 lb

## 2013-04-25 DIAGNOSIS — J9610 Chronic respiratory failure, unspecified whether with hypoxia or hypercapnia: Secondary | ICD-10-CM | POA: Diagnosis not present

## 2013-04-25 DIAGNOSIS — J449 Chronic obstructive pulmonary disease, unspecified: Secondary | ICD-10-CM

## 2013-04-25 DIAGNOSIS — I6529 Occlusion and stenosis of unspecified carotid artery: Secondary | ICD-10-CM

## 2013-04-25 DIAGNOSIS — J4489 Other specified chronic obstructive pulmonary disease: Secondary | ICD-10-CM

## 2013-04-25 MED ORDER — PREDNISONE 10 MG PO TABS: ORAL_TABLET | ORAL | Status: DC

## 2013-04-25 NOTE — Progress Notes (Addendum)
Spoke with pt and notified of recs per MW He verbalized understanding and rx was sent to pharm  Subjective:    Patient ID: [name], male    DOB: December 18, 1929   MRN: [id number]     Summary: Pulmonary/ f/u ov try astepro for pnds   Primary Provider/Referring Provider: [name]    Brief patient profile:  83   yowm last smoked 1983 severe COPD FEV1 of only 36% April 2007 with a 29% improvement after bronchodilators consistent with an asthmatic component. Baseline activity level--walk 1 mile daily with oxygen on. Maintained on Symbicort 160/4.84mcg 1 puff two times a day.    History of Present Illness  February 11, 2008 ov never took spiriva consistently. No variability to doe or sign cough. rec spiriva but w/in one week cp > [facility] with neg cardiac wu, still having occ cp , positional, better with rubbing chest and relaxing.  Retry spiriva   October 15, 2008 rov follow up, about the same did not get along well with spiriva , discont. taking caused dry mouth. still definitely benefits from as needed xopenex.    February 18, 2010 -- ov cc cough with green/white mucus, wheezing, SOB x5days. OTC not helping. Cough is worse at night, keeping him up at times. Wheezing started yesterday. rex doxy, prednisone then admit 2/14 to [facility] and d/c 2/17 with ct c/w emphysema > d/c on zmax and prednisone tapered off as of March 03, 2010   March 03, 2010 ov cc  minimally improved sob and cough with green sputum and nasal congestion esp in am .. doex > slow adls  minimally better p nebs rec  See Patient Care Coordinator before leaving for sinus CT  Thickening only  Prilosec otc 20mg  30 min before bfast and zantac 150 at bedtime as long as coughing ( reflux is to cough what oxygen is to fire)  Augmentin 875 mg twice daily with water and eat yogurt for lunch  Prednisone 10mg  2 daily back to normal then 1 daily until return.   March 17, 2010 ov Cough and Dyspnea- some better, mucus is now white and less volume  worse lying down, less need for rescue.  rec taper off prednisone >  5 days off  04/28/2010 ov/[name] no worse off prednisone,  Rare astepro, no need for rescue or even using the atrovent/xop more than twice dailly - overall much better.   rec Ipatropium / xopenex are twice regulary with ability to increase to 4 x daily if breathing worsens   10/14/2010 f/u ov/[name] cc Pt states breathing worse with exertion x 1 month. Still has no cough but lots of mucus esp in am with  No purulent sputum. ent wants to try ipatropium and scopalamine to dry up his drippy nose - no longer following med calendar for action plan at bottom rec Change ipatropium to 4 x daily and just use Only use your xopenex as a rescue medication to be used if you can't catch your breath by resting or doing a relaxed purse lip breathing pattern. The less you use it, the better it will work when you need it.   Prednisone 10 mg take  4 each am x 2 days,   2 each am x 2 days,  1 each am x2days and stop   Please schedule a follow up office visit in 6 weeks, call sooner if needed but bring the medication calendar with you   11/25/2010 f/u ov/[name] cc breathing better, nasal drainage better.minimal  cough. Doe at [place].minimal use of rescue   rec Follow med calendar  03/10/2011 Follow up and Med review  Returns for follow up and med review. We reviewed his meds and organized them into a med calendar with pt educaiton.   rec  05/01/2011 f/u ov/[name] cc 50 ft outside, one aisle at [employer], whole store with 02. No cough and minimal need for saba  Seen at [facility] rec change ipatropium neb  to atrovent > no change doe. rec ry tudorza one puff twice daily and stop atrovent and see how it effects your activity performance, if not benefit ok to change back to atrovent   10/04/2011 f/u ov/[name] did not bring med calendar cc  Loosing ground on tudorza so went to atrovent neb and seemed to help. rec Plan A = use automatically no matter what Symbicort  followed by Atrovent each am and symbiocort again in 12 hours plus continue the atrovent though the day in between about every 4-6 hours  Only use your albuterol (xopenex hfa -puffer-  is Plan B,  Nebulizer is plan C) as a rescue medication to be used if you can't catch your breath by resting or doing a relaxed purse lip breathing pattern. The less you use it, the better it will work when you need it.      Late add: Struggling a bit with implementation of action plans, need to review med calendar next ov   12/21/2011 f/u ov/[name] cc doe loosing ground = one aisle slow off 02,  Does ok on 02 at grocery, but even on 02 on treadmill at usual grade (flat) and speed (1.4 mph) on 02 x 20 min where used to to 1.5 x 30 min.  qnasal helped nasal discharge but increased bp so stopped it and drainage came back. rec Restart qnasal and monitor your blood pressure  Always wear 02 2lpm at bedtime and when walking more than room to room at home    01/29/2012 f/u ov/[name] cc slt better doe, fought off cold ok x sev weeks prior to OV  and back to rare if ever use xopenex  maint complaint is persistent x years watery rhinitis worse with cold air exp and with eating, no better on qnasal.     No obvious daytime variabilty or assoc chronic cough or cp or chest tightness, subjective wheeze overt sinus or hb symptoms. No unusual exp hx  >chlortrimeton As needed  > did not try   04/05/2012 Acute OV  Complains of  increased SOB, some wheezing and tightness in chest x3 weeks. Patient denies any discolored mucus, or fever, chest pain, orthopnea. Has had increased lower extremity swelling. Has been worse over the last few weeks. Did try Lasix 20 mg daily, but did not see much improvement. Patient denies any calf pain, hemoptysis, or known injury. Has had quite a bit of nasal congestion, and drainage. He denies sinus pain or pressure. rec Lasix 20mg   2 tabs daily for 3 days then As needed  For leg swelling.       05/22/2012 f/u ov/[name] copd/ resp failure on 02 2lpm sleeping and with activity Chief Complaint  Patient presents with  . Follow-up    Breathing improved since the last visit, not back at his baseline yet. No new co's today.   Sense of pnds worst in am never expectorates, better for only 5 min p "does his trick" to clear his throat. Rare use of xopenex hfa daytime, never needs neb saba rec  Take zantac 150 mg and chlortrimeton 4 mg at bedtime  To see what effect if any it has on your am congestion in your throat and nose   09/30/2012 f/u ov/[name] re: copd/02 dep  Chief Complaint  Patient presents with  . Follow-up    Pt states breathing worse for the past 5 wks. He states that he gets SOB and dizzy with minimal exertion.    dizzy ? Since started Zmax at [facility]. Doe x 100 ft even on 02, less cough on rx Confused with action plan meds even though read him med caledar back to me correctly   >>pred taper   10/15/2012 Follow up and Med review  Patient returns for a followup and medication review. Reviewed all his medications and organized them into a medication calendar with patient education. Appears the patient is taking his medications correctly. Patient requests a referral to allergist. Today. He complains of persistent nasal congestion, and drainage. We discussed using saline nasal rinses and saline gel to help with his nasal dryness. Due to oxygen. Use. Patient denies any hemoptysis, orthopnea, PND, or leg swelling. Last visit. Patient was having a flare. COPD, was treated with a prednisone taper, which is now resolved. Patient's breathing is back to baseline. rec Prednisone 10 mg take  4 each am x 2 days,   2 each am x 2 days,  1 each am x 2 days and stop  Stop zithromax Plan A symbiocort and Ipatropium Plan B = backup > xopenex 2 every 4 if needed Plan C = xopenex neb every 4 hours if needed   12/18/2012 f/u ov/[name] re: copd/ 02  at bedtime and with  exertion  Chief Complaint  Patient presents with  . Follow-up    Increased sob at all times x1 month.  using O2 more frequently   actually no sob at rest or at hs, wears 02 with activity and when he does wear it the sats are fine even when he has to stop due to sob rec Plan A= automatic  symbiocort and tudorza twice daily and ok to use your 02 more consistently with activity that you know makes you short of breath Plan B = backup > xopenex 2 every 4 if needed if can't catch your breath Plan C = xopenex neb every 4 hours if plan B not working, always try plan B first  Please see patient coordinator before you leave today  to schedule rehab  02/14/2013 f/u ov/[name] re:  Chief Complaint  Patient presents with  . Follow-up    Wants to make sure his medications are correct.  Pt c/o increased SOB with exertion.  easily confused with details of care, only using hfa xopenex not the neb xopenex, times right before he goes to rehab and feels he's making slow progress.  >>changed Symbicort to [facility] and atrovent neb to spiriva respimat  03/14/2013 Follow up  Returns for a followup for COPD. Last visit. Patient was changed over from Symbicort to [facility]   and he was also changed over from Atrovent nebulizers to Spiriva. Denies any wheezing, tightness, f/c/s.  Does feel that he has good and bad days.  He has started pulmonary rehabilitation classes. He denies a flare of cough or shortness of breath. rec  no change rx   04/25/2013 f/u ov/[name] re:  Chief Complaint  Patient presents with  . Acute Visit    Pt c/o increased DOE for the past 3 wks- worse for the past  4-5 days. He states that he is SOB with any exertion at all such as just raising up his arms. He also c/o nasal congestion for the past 3 months. He is using xopenex HFA at least twice per day.    incxrease doe assoc with prod of more mucus and nasal congestion  but better overnight and worse as day goes on, occ green mucus but mostly  white No desats walking on 02 at rehab 2lpm   Current Medications, Allergies, Complete Past Medical History, Past Surgical History, Family History, and Social History were reviewed in [employer] record.  ROS  The following are not active complaints unless bolded sore throat, dysphagia, dental problems, itching, sneezing,  nasal congestion or excess/ purulent secretions, ear ache,   fever, chills, sweats, unintended wt loss, pleuritic or exertional cp, hemoptysis,  orthopnea pnd or leg swelling, presyncope, palpitations, heartburn, abdominal pain, anorexia, nausea, vomiting, diarrhea  or change in bowel or urinary habits, change in stools or urine, dysuria,hematuria,  rash, arthralgias, visual complaints, headache, numbness weakness or ataxia or problems with walking or coordination,  change in mood/affect or memory.        Past Medical History:  CHRONIC RHINITIS (ICD-472.0)  - Sinus ct March 03, 2010 > thickening only, no acute changes  CHRONIC RESPIRATORY FAILURE (ICD-518.83)  - 02 rx since 2008  COPD UNSPECIFIED (ICD-496)....................................................................[name]  - PFTs 05/04/05 FEV1 36% ratio 28% with 29% improvement after bronchodilators DLCO 48%  - PFT's 03/26/08 30% ratio 34 with 14% improvement after bronchodilators DLC0 38 %  - Start noct 02 at 2lpm 2008 and on 03/04/09 desat @ > 185 ft so rec wear with activtiy > rm to rm  - [facility] June 30, 2008 100% > confirmed March 04, 2009  ISCHEMIC HEART DISEASE (ICD-414.9)  HYPERTENSION (ICD-401.9)  HYPERLIPIDEMIA (ICD-272.4)  SPN RUL by cxr August 19, 2009  COMPLEX MED REGIMEN-Meds reviewed with pt education and computerized med calendar 11/22/09 03/10/2011  , 10/15/2012           Objective:   Physical Exam  amb wm nad with nl vitals  wt 159 04/28/2010  > 10/14/2010 154  > 10/04/2011 150> 12/21/2011  150 > 149 01/29/2012 >154 04/05/2012 > 05/22/2012 148 > 09/30/2012  146 >>144> 10/15/2012  > 12/18/2012  145 > 148 02/14/2013 >148 03/14/2013 > 04/25/2013 148   HEENT mild to mod nonspecific turbinate edema.  Oropharynx no thrush or excess pnd or cobblestoning.  No JVD or cervical adenopathy. Mild accessory muscle hypertrophy. Trachea midline, nl thryroid. Chest was hyperinflated by percussion with diminished breath sounds and moderate increased exp time without wheeze.   Regular rate and rhythm without murmur gallop or rub or increase P2 or edema.  Abd: no hsm, nl excursion. Ext warm without cyanosis or clubbing.       CXR  04/25/2013 :  COPD without acute abnormality.     Assessment & Plan:

## 2013-04-25 NOTE — Assessment & Plan Note (Signed)
-   Start noct 02 at 2lpm 2008 and on 03/04/09 desat @ > 185 ft so rec wear with activtiy > rm to rm  - 02/15/2011   Walked RA x one lap @ 185 stopped due to  desat > corrected on 2lpm   05/23/2012  rx =  02 2lpm at hs and with activity > room to room  Adequate control on present rx, reviewed > no change in rx needed   

## 2013-04-25 NOTE — Assessment & Plan Note (Addendum)
-   PFTs 05/04/05 FEV1 36% ratio 28% with 29% improvement after bronchodilators DLCO 48%  - PFT's 03/26/08 30% ratio 34 with 14% improvement after bronchodilators DLC0 38 %  - Referred to rehab 12/19/2012  - changed to spiriva respimat 02/14/2013  - 02/14/2013 p extensive coaching HFA effectiveness =    90%  Adherence is always the initial "prime suspect" and is a multilayered concern that requires a "trust but verify" approach in every patient - starting with knowing how to use medications, especially inhalers, correctly, keeping up with refills and understanding the fundamental difference between maintenance and prns vs those medications only taken for a very short course and then stopped and not refilled.     Each maintenance medication was reviewed in detail including most importantly the difference between maintenance and as needed and under what circumstances the prns are to be used. This was done in the context of a medication calendar review which provided the patient with a user-friendly unambiguous mechanism for medication administration and reconciliation and provides an action plan for all active problems. It is critical that this be shown to every doctor  for modification during the office visit if necessary so the patient can use it as a working document.       ? Allergy/ rhinitis > rec humdify 02,  I reviewed both graphic and text formatted material regarding the diagnosis and management of rhinitis including how to use topical steroids effectively and why it is necessary to treat nasal obstruction and inflammation and infection all at  the same time to eradicate the cycle of inflammation causing obstruction causing an infection causing inflammation and so forth and so on....  Rx Prednisone 10 mg take  4 each am x 2 days,   2 each am x 2 days,  1 each am x 2 days and stop to see what difference if any this makes

## 2013-04-25 NOTE — Patient Instructions (Addendum)
Prednisone 10 mg take  4 each am x 2 days,   2 each am x 2 days,  1 each am x 2 days and stop   nasonex one twice daily - if nose stuffy use afrin immediately before nasonex for up to 5 days  Add humidity to your oxygen for now   Please remember to go to the  x-ray department downstairs for your tests - we will call you with the results when they are available.     See calendar for specific medication instructions and bring it back for each and every office visit for every healthcare provider you see.  Without it,  you may not receive the best quality medical care that we feel you deserve.  You will note that the calendar groups together  your maintenance  medications that are timed at particular times of the day.  Think of this as your checklist for what your doctor has instructed you to do until your next evaluation to see what benefit  there is  to staying on a consistent group of medications intended to keep you well.  The other group at the bottom is entirely up to you to use as you see fit  for specific symptoms that may arise between visits that require you to treat them on an as needed basis.  Think of this as your action plan or "what if" list.   Separating the top medications from the bottom group is fundamental to providing you adequate care going forward.    Please schedule a follow up visit in 3 months but call sooner if needed  Ok to rx Prevnar per Dr Melinda Crutch

## 2013-04-29 ENCOUNTER — Encounter (HOSPITAL_COMMUNITY)
Admission: RE | Admit: 2013-04-29 | Discharge: 2013-04-29 | Disposition: A | Discharge status: Home / Self Care | Payer: Medicare Other | Source: Ambulatory Visit | Attending: Internal Medicine | Admitting: Internal Medicine

## 2013-04-29 ENCOUNTER — Encounter (HOSPITAL_COMMUNITY): Payer: Medicare Other

## 2013-04-29 NOTE — Progress Notes (Signed)
[name] completed a Six-Minute Walk Test on 04/29/13 . [name] walked 841 feet with 0 breaks.  The patient's lowest oxygen saturation was 93 , highest heart rate was 90 , and highest blood pressure was 142/64. The patient was on 2 liters. [name] stated that shortness of breath hindered their walk test.

## 2013-05-01 ENCOUNTER — Encounter (HOSPITAL_COMMUNITY): Payer: Medicare Other

## 2013-05-02 ENCOUNTER — Encounter (HOSPITAL_COMMUNITY): Payer: Self-pay

## 2013-05-02 NOTE — Outcomes Assessment (Signed)
[facility] [place] Pulmonary Rehabilitation Final/Discharge Outcome Results  Anthropometrics:   Height (inches): 69   Weight (kg): 66.7   This is a change of +1.3kg from entrance.   Grip strength was measured using a Dynamometer.  The patient's discharge score was a 32.     This is a change of -2 from entrance.  Functional Status/Exercise Capacity:   [name] had a resting heart rate of 80 BPM, a resting blood pressure of 118/64, and an oxygen saturation of 98 % on 2 liters of O2.  [name] performed a discharge 6-minute walk test on 05/01/13.  The patient completed 841 feet in 6 minutes with 0 rest breaks.  This quantifies 2.15 METS. The patient's goal is to add 82 feet onto the baseline 6MWT.     The patient increased their 6-minute walk test distance by 69 feet.  [name]:   The [name] is a simple and standardized method of classifying disability in patients with COPD.  The assessment correlates disability and Travis.  Upon discharge the patients resting score was 2. The scale is provided below.    This is a change of +1 from entrance.  0= I only get breathless with strenuous exercise. 1= I get short of breath when hurrying on level ground or walking up a slight incline. 2= On level ground, I walk slower than people of the same age because of breathlessness, or have to stop for breath when walking at my own pace. 3= I stop for breath after walking 100 yards or after a few minutes on level ground. 4=I am too breathless to leave the house or I am breathless when dressing.     The patient completed the [place] ([facility]).  This questionnaire relates activities of daily living and shortness of breath.  The score ranges from 0-120, a higher score relates to severe shortness of breath during activities of daily living. The patient's score at discharge was 54.   This is a change of -10 from entrance.  Quality of  Life:   [name] Pulmonary Version is used to assess the patients satisfaction in different domains of their life; health and functioning, socioeconomic, psychological/spiritual, and family. The overall score is recorded out of 30 points.  The patient's goal is to achieve an overall score of 21 or higher.  [name] received a 18.69 upon discharge.    This is a change of +0.33 from entrance.  Clinical Assessment Tools:   The COPD Assessment Test (CAT) is a measurement tool to quantify how much of an impact the disease has on the patient's life.  This assessment aids the Pulmonary Rehab Team in designing the patients individualized treatment plan.  A CAT score ranges from 0-40.  A score of 10 or below indicates that COPD has a low impact on the patient's life whereas a score of 30 or higher indicates a severe impact. The patient's goal is a decrease of 1 point from entrance to discharge.  [name] had a CAT score of 19 upon discharge.     This is a change of -4 from entrance.  Nutrition:   The "Rate My Plate" is a dietary assessment that quantifies the balance of a patient's diet.  This tool allows the Pulmonary Rehab Team to key in on the areas of the patient's diet that needs improving.  The team can then focus their nutritional education on those areas.  If the patient scores 24-40, this means there are many ways they can make their eating habits healthier, 41-57 states that there are some ways they can make their eating habits healthier and a score of 58-72 states that they are making many healthy choices.  The patient's goal is to achieve a score of 49 or higher on this assessment.  [name] scored a 41 upon discharge.     This is a change of +6 from entrance.  Oxygen Compliance:   Patient is currently on 2 liters at rest, 2 liters at night, and 2 liters for exercise.  [name] is not currently using a cpap.  The patient is currently compliant.  The patient states that they do not  have barriers that keep them from using their oxygen.    This is a no change result from entrance.    Education:   [name] attended all education classes.  [name] completed a discharge educational assessment and achieved a score of 13/14.    This is a change of +1 from entrance.   Smoking Cessation:  The patient is not currently smoking.  Exercise:   [name] was provided with an individualized Home Exercise Prescription (HEP) at the entrance of the program.  The patient's goal is to be exercising 30-60 minutes, 3-5 days per week. Upon discharge the patient is exercising 4 days at home.  This is a change of 1 day from entrance.  After graduation from Pulmonary Rehab, [name] will continue exercising at home.

## 2013-05-05 ENCOUNTER — Ambulatory Visit: Payer: Medicare Other | Admitting: Neurology

## 2013-05-06 ENCOUNTER — Encounter (HOSPITAL_COMMUNITY): Payer: Medicare Other

## 2013-05-06 DIAGNOSIS — R0982 Postnasal drip: Secondary | ICD-10-CM | POA: Diagnosis not present

## 2013-05-06 DIAGNOSIS — K219 Gastro-esophageal reflux disease without esophagitis: Secondary | ICD-10-CM | POA: Diagnosis not present

## 2013-05-06 DIAGNOSIS — J449 Chronic obstructive pulmonary disease, unspecified: Secondary | ICD-10-CM | POA: Diagnosis not present

## 2013-05-13 ENCOUNTER — Encounter: Payer: Self-pay | Admitting: Family

## 2013-05-13 DIAGNOSIS — N139 Obstructive and reflux uropathy, unspecified: Secondary | ICD-10-CM | POA: Diagnosis not present

## 2013-05-13 DIAGNOSIS — R300 Dysuria: Secondary | ICD-10-CM | POA: Diagnosis not present

## 2013-05-14 ENCOUNTER — Ambulatory Visit (INDEPENDENT_AMBULATORY_CARE_PROVIDER_SITE_OTHER): Payer: Medicare Other | Admitting: Family

## 2013-05-14 ENCOUNTER — Encounter: Payer: Self-pay | Admitting: Family

## 2013-05-14 ENCOUNTER — Ambulatory Visit (HOSPITAL_COMMUNITY)
Admission: RE | Admit: 2013-05-14 | Discharge: 2013-05-14 | Disposition: A | Discharge status: Home / Self Care | Payer: Medicare Other | Source: Ambulatory Visit | Attending: Family | Admitting: Family

## 2013-05-14 VITALS — BP 110/56 | HR 71 | Resp 16 | Ht 69.0 in | Wt 147.0 lb

## 2013-05-14 DIAGNOSIS — I6529 Occlusion and stenosis of unspecified carotid artery: Secondary | ICD-10-CM | POA: Diagnosis not present

## 2013-05-14 NOTE — Patient Instructions (Signed)
Stroke Prevention Some medical conditions and behaviors are associated with an increased chance of having a stroke. You may prevent a stroke by making healthy choices and managing medical conditions. HOW CAN I REDUCE MY RISK OF HAVING A STROKE?   Stay physically active. Get at least 30 minutes of activity on most or all days.  Do not smoke. It may also be helpful to avoid exposure to secondhand smoke.  Limit alcohol use. Moderate alcohol use is considered to be:  No more than 2 drinks per day for men.  No more than 1 drink per day for nonpregnant women.  Eat healthy foods. This involves  Eating 5 or more servings of fruits and vegetables a day.  Following a diet that addresses high blood pressure (hypertension), high cholesterol, diabetes, or obesity.  Manage your cholesterol levels.  A diet low in saturated fat, trans fat, and cholesterol and high in fiber may control cholesterol levels.  Take any prescribed medicines to control cholesterol as directed by your health care provider.  Manage your diabetes.  A controlled-carbohydrate, controlled-sugar diet is recommended to manage diabetes.  Take any prescribed medicines to control diabetes as directed by your health care provider.  Control your hypertension.  A low-salt (sodium), low-saturated fat, low-trans fat, and low-cholesterol diet is recommended to manage hypertension.  Take any prescribed medicines to control hypertension as directed by your health care provider.  Maintain a healthy weight.  A reduced-calorie, low-sodium, low-saturated fat, low-trans fat, low-cholesterol diet is recommended to manage weight.  Stop drug abuse.  Avoid taking birth control pills.  Talk to your health care provider about the risks of taking birth control pills if you are over 35 years old, smoke, get migraines, or have ever had a blood clot.  Get evaluated for sleep disorders (sleep apnea).  Talk to your health care provider about  getting a sleep evaluation if you snore a lot or have excessive sleepiness.  Take medicines as directed by your health care provider.  For some people, aspirin or blood thinners (anticoagulants) are helpful in reducing the risk of forming abnormal blood clots that can lead to stroke. If you have the irregular heart rhythm of atrial fibrillation, you should be on a blood thinner unless there is a good reason you cannot take them.  Understand all your medicine instructions.  Make sure that other other conditions (such as anemia or atherosclerosis) are addressed. SEEK IMMEDIATE MEDICAL CARE IF:   You have sudden weakness or numbness of the face, arm, or leg, especially on one side of the body.  Your face or eyelid droops to one side.  You have sudden confusion.  You have trouble speaking (aphasia) or understanding.  You have sudden trouble seeing in one or both eyes.  You have sudden trouble walking.  You have dizziness.  You have a loss of balance or coordination.  You have a sudden, severe headache with no known cause.  You have new chest pain or an irregular heartbeat. Any of these symptoms may represent a serious problem that is an emergency. Do not wait to see if the symptoms will go away. Get medical help at once. Call your local emergency services  (911 in [place] Do not drive yourself to the hospital. Document Released: 02/03/2004 Document Revised: 10/16/2012 Document Reviewed: 06/28/2012 [employer] Patient Information 2014 [employer].  

## 2013-05-14 NOTE — Progress Notes (Signed)
Established Carotid Patient   History of Present Illness  [name] is a 80 y.o. male patient of Dr. Nicole Cella seen for annual carotid duplex for known stenosis. He was initially referred by his cardiologist for monitoring of his ICA's. Pt had a 2003 CABG x 4 vessels. He uses 2 liters portable O2 when walking and sleeping, has severe COPD. Pt spoke with his PCP re intermittent RUQ pain in the last 6 months or so, ranitidine helps but does not resolve this. This happens only after eating, but only occasionally; he states that he still has his gall bladder.  Patient has not had previous carotid artery intervention.  Patient has Negative history of TIA or stroke symptom.  The patient denies amaurosis fugax or monocular blindness.  The patient  denies facial drooping.  Pt. denies hemiplegia.  The patient denies receptive or expressive aphasia.  Arms feel weak, burning, and tingling in the last few weeks, more so than usual. He does not know of any c-spine issues.   Pt reports New Medical or Surgical History: benign prostate bx and UTI that was treated. Pt denies claudication symptoms in his legs with walking; he walks 20 minutes on his treadmill daily, is limited by dyspnea. He has seen a neurologist for dizziness, states he was diagnosed with paroxysmal vertigo. He has ankle swelling later in the day, left worse than right, no ankle swelling in the morning, he has knee high compression hose, advised to donn before he gets OOB.  Pt Diabetic: No Pt smoker: former smoker, quit in 1983  Pt meds include: Statin : Yes ASA: Yes Other anticoagulants/antiplatelets: no   Past Medical History  Diagnosis Date  . Hypertension   . S/P CABG x 4   . CAD (coronary artery disease)   . COPD (chronic obstructive pulmonary disease)   . Hyperlipidemia   . Chronic rhinitis   . Chronic respiratory failure   . Peripheral vascular disease   . Upper respiratory infection 08/2012  . Asthma   .  Syncope and collapse     Social History History  Substance Use Topics  . Smoking status: Former Smoker -- 2.00 packs/day for 33 years    Types: Cigarettes    Quit date: 01/09/1982  . Smokeless tobacco: Never Used  . Alcohol Use: Yes     Comment: 1.5-3 oz daily    Family History Family History  Problem Relation Age of Onset  . Heart disease Mother   . Breast cancer Mother   . Cancer Mother     Breast cancer  . Hypertension Mother   . Other Mother     AAA  . Heart disease Father   . Heart attack Father   . Heart disease Brother   . Hyperlipidemia Brother   . Hypertension Brother   . Heart disease Brother   . Stroke Brother     x3, still living   . Peripheral vascular disease Brother   . Allergies Brother     Surgical History Past Surgical History  Procedure Laterality Date  . Coronary artery bypass graft  2003  . Cataract extraction  11-20-11  . Lithotripsy      Allergies  Allergen Reactions  . Cefdinir     REACTION: diarrhea  . Sulfonamide Derivatives     REACTION: facial/tongue swelling    Current Outpatient Prescriptions  Medication Sig Dispense Refill  . aspirin 325 MG tablet Take 1 tablet (325 mg total) by mouth every morning.      Marland Kitchen  atorvastatin (LIPITOR) 20 MG tablet TAKE ONE TABLET BY MOUTH ONCE DAILY  30 tablet  6  . Cholecalciferol (VITAMIN D3) 1000 UNITS CAPS Take 1 capsule (1,000 Units total) by mouth daily.      Marland Kitchen doxycycline (VIBRAMYCIN) 100 MG capsule daily.      . furosemide (LASIX) 20 MG tablet Take 20 mg by mouth daily as needed for fluid.      Marland Kitchen guaiFENesin (MUCINEX) 600 MG 12 hr tablet 1-2 every 12 hours as needed for chest congestion      . levalbuterol (XOPENEX) 1.25 MG/3ML nebulizer solution Take 1.25 mg by nebulization every 4 (four) hours as needed for wheezing or shortness of breath (((PLAN B))). Dx 496      . loratadine (CLARITIN) 10 MG tablet Take 10 mg by mouth daily as needed for allergies.      Marland Kitchen LOSARTAN POTASSIUM PO Take 1  tablet by mouth daily. Pt unsure of strength- rx per Dr Wynonia Lawman      . mometasone-formoterol (DULERA) 200-5 MCG/ACT AERO Inhale 2 puffs into the lungs 2 (two) times daily.  13 g  5  . Multiple Vitamin (MULTIVITAMIN) tablet Take 1 tablet by mouth daily.      Marland Kitchen NASONEX 50 MCG/ACT nasal spray as needed.      Marland Kitchen NITROSTAT 0.4 MG SL tablet TAKE 1 TABLET SUBLINGUALLY AS DIRECTED AS NEEDED FOR CHEST PAIN  25 tablet  3  . predniSONE (DELTASONE) 10 MG tablet Take  4 each am x 2 days,   2 each am x 2 days,  1 each am x 2 days and stop  14 tablet  0  . ranitidine (ZANTAC) 300 MG tablet Take 1 tablet by mouth 2 (two) times daily.      . sertraline (ZOLOFT) 25 MG tablet Take 25 mg by mouth daily.      . Sodium Chloride-Sodium Bicarb (AYR SALINE NASAL RINSE) 1.57 G PACK May use rinses/gel as needed for nasal congestion      . Tiotropium Bromide Monohydrate (SPIRIVA RESPIMAT) 2.5 MCG/ACT AERS Inhale 2 puffs into the lungs daily.  1 Inhaler  5  . TOPROL XL 25 MG 24 hr tablet TAKE 1 TABLET BY MOUTH EVERY DAY  30 tablet  10  . levalbuterol (XOPENEX HFA) 45 MCG/ACT inhaler INHLAE 2 PUFFS INTO THE LUNGS EVERY 4 HOURS AS NEEDED FOR WHEEZING AND SHORTNESS OF BREATH (((PLAN A)))       No current facility-administered medications for this visit.    Review of Systems : See HPI for pertinent positives and negatives.  Physical Examination  Filed Vitals:   05/14/13 1401 05/14/13 1404  BP: 108/61 110/56  Pulse: 69 71  Resp:  16  Height:  5\' 9"  (1.753 m)  Weight:  147 lb (66.679 kg)  SpO2:  95%  Body mass index is 21.7 kg/(m^2).  General: WDWN male in NAD GAIT: normal Eyes: PERRLA Pulmonary:  Non-labored, CTAB, Negative  Rales, Negative rhonchi, & Negative wheezing.  Cardiac: regular Rhythm ,  Negative detected murmur.  VASCULAR EXAM Carotid Bruits Left Right   Negative Negative   Radial pulses are 2+ palpable and equal.  Gastrointestinal: soft, nontender, BS WNL, no r/g,  negative masses.  Musculoskeletal: Negative muscle atrophy/wasting. M/S 5/5 throughout, Extremities without ischemic changes  Neurologic: A&O X 3; Appropriate Affect ; SENSATION ;normal;  Speech is normal CN 2-12 intact except has some hearing loss, Motor exam as listed above.   Non-Invasive Vascular Imaging CAROTID DUPLEX 05/14/2013   CEREBROVASCULAR DUPLEX EVALUATION    INDICATION: Carotid stenosis    PREVIOUS INTERVENTION(S): N/A    DUPLEX EXAM:     RIGHT  LEFT  Peak Systolic Velocities (cm/s) End Diastolic Velocities (cm/s) Plaque LOCATION Peak Systolic Velocities (cm/s) End Diastolic Velocities (cm/s) Plaque  98 8  CCA PROXIMAL 142 16   85 11  CCA MID 97 9   84 9 HT CCA DISTAL 77 13 HT  98 6 HT ECA 152 13 HT  227 46 HT ICA PROXIMAL 140 25 HT  91 14  ICA MID 129 19   64 13  ICA DISTAL 70 17     2.67 ICA / CCA Ratio (PSV) 1.44  Antegrade Vertebral Flow Antegrade  998 Brachial Systolic Pressure (mmHg) 338  Triphasic Brachial Artery Waveforms Triphasic    Plaque Morphology:  HM = Homogeneous, HT = Heterogeneous, CP = Calcific Plaque, SP = Smooth Plaque, IP = Irregular Plaque     ADDITIONAL FINDINGS:     IMPRESSION: Right internal carotid artery stenosis present in the 40%-59% range. Left internal carotid artery stenosis present of less than 40%.    Compared to the previous exam:  Unchanged since previous study on 05/10/2012.    Assessment: [name] is a 80 y.o. male who is seen for annual carotid duplex for known stenosis. Right internal carotid artery stenosis present in the 40%-59% range. Left internal carotid artery stenosis present of less than 40%. Unchanged since previous study on 05/10/2012. If he has continued post prandial pain and this does not seem to be relieved by PPI's, and if he continues to have weight loss, will consider adding mesenteric artery Duplex at next  visit. He lost 4 pounds in the last year.  Plan: Follow-up in 1 year with Carotid Duplex scan.   I discussed in depth with the patient the nature of atherosclerosis, and emphasized the importance of maximal medical management including strict control of blood pressure, blood glucose, and lipid levels, obtaining regular exercise, and continued cessation of smoking.  The patient is aware that without maximal medical management the underlying atherosclerotic disease process will progress, limiting the benefit of any interventions. The patient was given information about stroke prevention and what symptoms should prompt the patient to seek immediate medical care. Thank you for allowing [place] to participate in this patient's care.  Clemon Chambers, RN, MSN, FNP-C [facility] Office: [phone]  Clinic Physician: Scot Dock  05/14/2013 1:56 PM

## 2013-05-14 NOTE — Addendum Note (Signed)
Addended by: Mena Goes on: 05/14/2013 03:24 PM   Modules accepted: Orders

## 2013-05-19 DIAGNOSIS — L570 Actinic keratosis: Secondary | ICD-10-CM | POA: Diagnosis not present

## 2013-05-19 DIAGNOSIS — D485 Neoplasm of uncertain behavior of skin: Secondary | ICD-10-CM | POA: Diagnosis not present

## 2013-05-20 DIAGNOSIS — N419 Inflammatory disease of prostate, unspecified: Secondary | ICD-10-CM | POA: Diagnosis not present

## 2013-05-20 DIAGNOSIS — N401 Enlarged prostate with lower urinary tract symptoms: Secondary | ICD-10-CM | POA: Diagnosis not present

## 2013-05-21 ENCOUNTER — Telehealth: Payer: Self-pay | Admitting: Emergency Medicine

## 2013-05-21 NOTE — Telephone Encounter (Signed)
Pt called to report that he unintentionally took his xopenex 4 puffs instead of 2 puffs. He doesn't seem to be having any significant side effects - he does experience some tachycardia normally, today is no different. Possibly some mild jitteriness, but otherwise feels fine. I reassured him that I did not think he was in any danger, as ked him to call me back if he changed in any way.

## 2013-05-27 ENCOUNTER — Other Ambulatory Visit: Payer: Self-pay | Admitting: *Deleted

## 2013-05-27 DIAGNOSIS — E782 Mixed hyperlipidemia: Secondary | ICD-10-CM

## 2013-06-03 DIAGNOSIS — N139 Obstructive and reflux uropathy, unspecified: Secondary | ICD-10-CM | POA: Diagnosis not present

## 2013-06-03 DIAGNOSIS — R109 Unspecified abdominal pain: Secondary | ICD-10-CM | POA: Diagnosis not present

## 2013-06-03 DIAGNOSIS — N400 Enlarged prostate without lower urinary tract symptoms: Secondary | ICD-10-CM | POA: Diagnosis not present

## 2013-06-03 DIAGNOSIS — N401 Enlarged prostate with lower urinary tract symptoms: Secondary | ICD-10-CM | POA: Diagnosis not present

## 2013-06-03 DIAGNOSIS — N200 Calculus of kidney: Secondary | ICD-10-CM | POA: Diagnosis not present

## 2013-06-06 ENCOUNTER — Other Ambulatory Visit (INDEPENDENT_AMBULATORY_CARE_PROVIDER_SITE_OTHER): Payer: Medicare Other

## 2013-06-06 DIAGNOSIS — E782 Mixed hyperlipidemia: Secondary | ICD-10-CM

## 2013-06-06 LAB — HEPATIC FUNCTION PANEL
ALT: 15 U/L (ref 0–53)
AST: 23 U/L (ref 0–37)
Albumin: 3.9 g/dL (ref 3.5–5.2)
Alkaline Phosphatase: 79 U/L (ref 39–117)
Bilirubin, Direct: 0.1 mg/dL (ref 0.0–0.3)
Total Bilirubin: 0.6 mg/dL (ref 0.2–1.2)
Total Protein: 6.5 g/dL (ref 6.0–8.3)

## 2013-06-06 LAB — LIPID PANEL
Cholesterol: 131 mg/dL (ref 0–200)
HDL: 62.4 mg/dL (ref >39.00)
LDL Cholesterol: 58 mg/dL (ref 0–99)
Total CHOL/HDL Ratio: 2
Triglycerides: 55 mg/dL (ref 0.0–149.0)
VLDL: 11 mg/dL (ref 0.0–40.0)

## 2013-06-16 ENCOUNTER — Telehealth: Payer: Self-pay

## 2013-06-16 NOTE — Telephone Encounter (Signed)
pt given lab results.Excellent. Recheck in 1 year. pt rqst copy be mailed.done.

## 2013-06-16 NOTE — Telephone Encounter (Signed)
Message copied by Lamar Laundry on Mon Jun 16, 2013 10:13 AM ------      Message from: Daneen Schick      Created: Wed Jun 11, 2013  5:28 PM       Excellent. Recheck in 1 year ------

## 2013-06-18 DIAGNOSIS — L905 Scar conditions and fibrosis of skin: Secondary | ICD-10-CM | POA: Diagnosis not present

## 2013-06-18 DIAGNOSIS — D485 Neoplasm of uncertain behavior of skin: Secondary | ICD-10-CM | POA: Diagnosis not present

## 2013-06-18 DIAGNOSIS — L918 Other hypertrophic disorders of the skin: Secondary | ICD-10-CM | POA: Diagnosis not present

## 2013-06-19 ENCOUNTER — Encounter: Payer: Self-pay | Admitting: Internal Medicine

## 2013-06-19 ENCOUNTER — Ambulatory Visit (INDEPENDENT_AMBULATORY_CARE_PROVIDER_SITE_OTHER): Payer: Medicare Other | Admitting: Internal Medicine

## 2013-06-19 VITALS — BP 110/58 | HR 71 | Temp 97.7°F | Ht 69.0 in | Wt 150.0 lb

## 2013-06-19 DIAGNOSIS — J449 Chronic obstructive pulmonary disease, unspecified: Secondary | ICD-10-CM

## 2013-06-19 DIAGNOSIS — J9610 Chronic respiratory failure, unspecified whether with hypoxia or hypercapnia: Secondary | ICD-10-CM

## 2013-06-19 DIAGNOSIS — I6529 Occlusion and stenosis of unspecified carotid artery: Secondary | ICD-10-CM | POA: Diagnosis not present

## 2013-06-19 MED ORDER — BUDESONIDE-FORMOTEROL FUMARATE 160-4.5 MCG/ACT IN AERO: INHALATION_SPRAY | RESPIRATORY_TRACT | Status: DC

## 2013-06-19 MED ORDER — PREDNISONE 10 MG PO TABS: ORAL_TABLET | ORAL | Status: DC

## 2013-06-19 NOTE — Progress Notes (Signed)
Spoke with pt and notified of recs per MW He verbalized understanding and rx was sent to pharm  Subjective:    Patient ID: [name], male    DOB: 08-22-1929   MRN: [id number]     Summary: Pulmonary/ f/u ov try astepro for pnds   Primary Provider/Referring Provider: [name]    Brief patient profile:  32   yowm last smoked 1983 with GOLD III COPD with some reversibility and 02 dep at hs and with activity    History of Present Illness     05/01/2011 f/u ov/Amiera Herzberg cc 50 ft outside, one aisle at HT, whole store with 02. No cough and minimal need for saba Seen at [facility] rec change ipatropium neb  to atrovent > no change doe. rec ry tudorza one puff twice daily and stop atrovent and see how it effects your activity performance, if not benefit ok to change back to atrovent   10/04/2011 f/u ov/Loribeth Katich did not bring med calendar cc  Loosing ground on tudorza so went to atrovent neb and seemed to help. rec Plan A = use automatically no matter what Symbicort followed by Atrovent each am and symbiocort again in 12 hours plus continue the atrovent though the day in between about every 4-6 hours Only use your albuterol (xopenex hfa -puffer-  is Plan B,  Nebulizer is plan C) .      12/18/2012 f/u ov/Tobby Fawcett re: copd/ 02  at bedtime and with exertion  Chief Complaint  Patient presents with  . Follow-up    Increased sob at all times x1 month.  using O2 more frequently   actually no sob at rest or at hs, wears 02 with activity and when he does wear it the sats are fine even when he has to stop due to sob rec Plan A= automatic  symbiocort and tudorza twice daily and ok to use your 02 more consistently with activity that you know makes you short of breath Plan B = backup > xopenex 2 every 4 if needed if can't catch your breath Plan C = xopenex neb every 4 hours if plan B not working, always try plan B first  Please see patient coordinator before you leave today  to schedule rehab  02/14/2013 f/u ov/Lawana Hartzell  re:  Chief Complaint  Patient presents with  . Follow-up    Wants to make sure his medications are correct.  Pt c/o increased SOB with exertion.  easily confused with details of care, only using hfa xopenex not the neb xopenex, times right before he goes to rehab and feels he's making slow progress.  >>changed Symbicort to [facility] and atrovent neb to spiriva respimat     04/25/2013 f/u ov/Laiya Wisby re:  Chief Complaint  Patient presents with  . Acute Visit    Pt c/o increased DOE for the past 3 wks- worse for the past 4-5 days. He states that he is SOB with any exertion at all such as just raising up his arms. He also c/o nasal congestion for the past 3 months. He is using xopenex HFA at least twice per day.    incxrease doe assoc with prod of more mucus and nasal congestion  but better overnight and worse as day goes on, occ green mucus but mostly white No desats walking on 02 at rehab 2lpm rec Prednisone 10 mg take  4 each am x 2 days,   2 each am x 2 days,  1 each am x 2 days and stop  Follow med calendar    06/19/2013 f/u ov/Mellanie Bejarano re: GOLD III COPD with some reversibility on dulera 200 2bid and spriva respimat  Chief Complaint  Patient presents with  . Follow-up    Pt c/o increased DOE for the past month. He states he gets SOB with walking approx 50 ft.    using xopenex qid never neb Did get def transient  benefit from prednisone    No obvious day to day or daytime variabilty or assoc chronic cough or cp or chest tightness, subjective wheeze overt sinus or hb symptoms. No unusual exp hx or h/o childhood pna/ asthma or knowledge of premature birth.  Sleeping ok without nocturnal  or early am exacerbation  of respiratory  c/o's or need for noct saba. Also denies any obvious fluctuation of symptoms with weather or environmental changes or other aggravating or alleviating factors except as outlined above   Current Medications, Allergies, Complete Past Medical History, Past Surgical History,  Family History, and Social History were reviewed in [employer] record.  ROS  The following are not active complaints unless bolded sore throat, dysphagia, dental problems, itching, sneezing,  nasal congestion or excess/ purulent secretions, ear ache,   fever, chills, sweats, unintended wt loss, pleuritic or exertional cp, hemoptysis,  orthopnea pnd or leg swelling, presyncope, palpitations, heartburn, abdominal pain, anorexia, nausea, vomiting, diarrhea  or change in bowel or urinary habits, change in stools or urine, dysuria,hematuria,  rash, arthralgias, visual complaints, headache, numbness weakness or ataxia or problems with walking or coordination,  change in mood/affect or memory.             Past Medical History:  CHRONIC RHINITIS (ICD-472.0)  - Sinus ct March 03, 2010 > thickening only, no acute changes  CHRONIC RESPIRATORY FAILURE (ICD-518.83)  - 02 rx since 2008  COPD UNSPECIFIED (ICD-496)....................................................................[name]  - PFTs 05/04/05 FEV1 36% ratio 28% with 29% improvement after bronchodilators DLCO 48%  - PFT's 03/26/08 30% ratio 34 with 14% improvement after bronchodilators DLC0 38 %  - Start noct 02 at 2lpm 2008 and on 03/04/09 desat @ > 185 ft so rec wear with activtiy > rm to rm  - [facility] June 30, 2008 100% > confirmed March 04, 2009  ISCHEMIC HEART DISEASE (ICD-414.9)  HYPERTENSION (ICD-401.9)  HYPERLIPIDEMIA (ICD-272.4)  SPN RUL by cxr August 19, 2009  COMPLEX MED REGIMEN-Meds reviewed with pt education and computerized med calendar 11/22/09 03/10/2011  , 10/15/2012           Objective:   Physical Exam  amb wm nad with nl vitals   wt 159 04/28/2010  > 10/14/2010 154  > 10/04/2011 150> 12/21/2011  150 > 149 01/29/2012 >154 04/05/2012 > 05/22/2012 148 > 09/30/2012  146 >>144> 10/15/2012 > 12/18/2012  145 > 148 02/14/2013 >148 03/14/2013 > 04/25/2013 148 > 06/19/2013 150   HEENT mild to mod nonspecific turbinate  edema.  Oropharynx no thrush or excess pnd or cobblestoning.  No JVD or cervical adenopathy. Mild accessory muscle hypertrophy. Trachea midline, nl thryroid. Chest was hyperinflated by percussion with diminished breath sounds and moderate increased exp time without wheeze.   Regular rate and rhythm without murmur gallop or rub or increase P2 or edema.  Abd: no hsm, nl excursion. Ext warm without cyanosis or clubbing.       CXR  04/25/2013 :  COPD without acute abnormality.     Assessment & Plan:

## 2013-06-19 NOTE — Patient Instructions (Addendum)
Prednisone 10 mg take 2  each am until better and need xopenex only maybe twice daily, then reduce to 10 mg one daily   See Tammy NP  2 weeks with all your medications, even over the counter meds, separated in two separate bags, the ones you take no matter what vs the ones you stop once you feel better and take only as needed when you feel you need them.   Tammy  will generate for you a new user friendly medication calendar that will put [place] all on the same page re: your medication use.     Without this process, it simply isn't possible to assure that we are providing  your outpatient care  with  the attention to detail we feel you deserve.   If we cannot assure that you're getting that kind of care,  then we cannot manage your problem effectively from this clinic.  Once you have seen Tammy and we are sure that we're all on the same page with your medication use she will arrange follow up with me.  Late add check to see if he attended rehab and if not strongly rec he do so

## 2013-06-22 NOTE — Assessment & Plan Note (Signed)
-   Start noct 02 at 2lpm 2008 and on 03/04/09 desat @ > 185 ft so rec wear with activtiy > rm to rm  - 02/15/2011   Walked RA x one lap @ 185 stopped due to  desat > corrected on 2lpm   05/23/2012  rx =  02 2lpm at hs and with activity > room to room  Adequate control on present rx, reviewed > no change in rx needed

## 2013-06-22 NOTE — Assessment & Plan Note (Signed)
-   PFTs 05/04/05 FEV1 36% ratio 28% with 29% improvement after bronchodilators DLCO 48%  - PFT's 03/26/08 30% ratio 34 with 14% improvement after bronchodilators DLC0 38 %  - Referred to rehab 12/19/2012  - changed to spiriva respimat 02/14/2013  - 02/14/2013 p extensive coaching HFA effectiveness =    90%   DDX of  difficult airways management all start with A and  include Adherence, Ace Inhibitors, Acid Reflux, Active Sinus Disease, Alpha 1 Antitripsin deficiency, Anxiety masquerading as Airways dz,  ABPA,  allergy(esp in young), Aspiration (esp in elderly), Adverse effects of DPI,  Active smokers, plus two Bs  = Bronchiectasis and Beta blocker use..and one C= CHF   Adherence is always the initial "prime suspect" and is a multilayered concern that requires a "trust but verify" approach in every patient - starting with knowing how to use medications, especially inhalers, correctly, keeping up with refills and understanding the fundamental difference between maintenance and prns vs those medications only taken for a very short course and then stopped and not refilled.  - confirmed adequate hfa technique  ? Allergy/ asthmatic component suggested by response to prednisone. The goal with a chronic steroid dependent illness is always arriving at the lowest effective dose that controls the disease/symptoms and not accepting a set "formula" which is based on statistics or guidelines that don't always take into account patient  variability or the natural hx of the dz in every individual patient, which may well vary over time.  For now therefore I recommend the patient maintain  A ceiling of 20 mg and a floor of 10 mg daily for now

## 2013-06-24 DIAGNOSIS — I4949 Other premature depolarization: Secondary | ICD-10-CM | POA: Diagnosis not present

## 2013-06-24 DIAGNOSIS — I739 Peripheral vascular disease, unspecified: Secondary | ICD-10-CM | POA: Diagnosis not present

## 2013-06-24 DIAGNOSIS — J441 Chronic obstructive pulmonary disease with (acute) exacerbation: Secondary | ICD-10-CM | POA: Diagnosis not present

## 2013-06-24 DIAGNOSIS — I119 Hypertensive heart disease without heart failure: Secondary | ICD-10-CM | POA: Diagnosis not present

## 2013-06-24 DIAGNOSIS — E785 Hyperlipidemia, unspecified: Secondary | ICD-10-CM | POA: Diagnosis not present

## 2013-06-24 DIAGNOSIS — R5381 Other malaise: Secondary | ICD-10-CM | POA: Diagnosis not present

## 2013-06-24 DIAGNOSIS — R0609 Other forms of dyspnea: Secondary | ICD-10-CM | POA: Diagnosis not present

## 2013-06-24 DIAGNOSIS — R0989 Other specified symptoms and signs involving the circulatory and respiratory systems: Secondary | ICD-10-CM | POA: Diagnosis not present

## 2013-06-24 DIAGNOSIS — I2510 Atherosclerotic heart disease of native coronary artery without angina pectoris: Secondary | ICD-10-CM | POA: Diagnosis not present

## 2013-06-24 DIAGNOSIS — R5383 Other fatigue: Secondary | ICD-10-CM | POA: Diagnosis not present

## 2013-06-29 ENCOUNTER — Other Ambulatory Visit: Payer: Self-pay | Admitting: Interventional Cardiology

## 2013-06-30 NOTE — Telephone Encounter (Signed)
Disregard message

## 2013-07-07 DIAGNOSIS — R972 Elevated prostate specific antigen [PSA]: Secondary | ICD-10-CM | POA: Diagnosis not present

## 2013-07-10 DIAGNOSIS — R972 Elevated prostate specific antigen [PSA]: Secondary | ICD-10-CM | POA: Diagnosis not present

## 2013-07-17 ENCOUNTER — Encounter: Payer: Self-pay | Admitting: Adult Health

## 2013-07-17 ENCOUNTER — Ambulatory Visit (INDEPENDENT_AMBULATORY_CARE_PROVIDER_SITE_OTHER): Payer: Medicare Other | Admitting: Adult Health

## 2013-07-17 VITALS — BP 120/68 | HR 78 | Temp 98.2°F | Ht 69.0 in | Wt 148.2 lb

## 2013-07-17 DIAGNOSIS — J449 Chronic obstructive pulmonary disease, unspecified: Secondary | ICD-10-CM

## 2013-07-17 NOTE — Patient Instructions (Addendum)
Follow med calendar closely and bring to each visit.  Decrease prednisone 10mg  1/2 daily for 1 week then 1/2 every other day for 1 week and then stop.  May use saline nasal spray and gel As needed  For congestion .  Please contact office for sooner follow up if symptoms do not improve or worsen or seek emergency care   Follow up Dr. [name]  In 6-8  weeks and As needed.

## 2013-07-17 NOTE — Assessment & Plan Note (Signed)
Recent exacerbation, now improving, near baseline. We'll try to taper off the steroids slowly.  Plan  Follow med calendar closely and bring to each visit.  Decrease prednisone 10mg  1/2 daily for 1 week then 1/2 every other day for 1 week and then stop.  May use saline nasal spray and gel As needed  For congestion .  Please contact office for sooner follow up if symptoms do not improve or worsen or seek emergency care   Follow up Dr. [name]  In 6-8  weeks and As needed.

## 2013-07-17 NOTE — Progress Notes (Signed)
Spoke with pt and notified of recs per MW He verbalized understanding and rx was sent to pharm  Subjective:    Patient ID: [name], male    DOB: September 10, 1929   MRN: [id number]     Summary: Pulmonary/ f/u ov try astepro for pnds   Primary Provider/Referring Provider: [name]    Brief patient profile:  66   yowm last smoked 1983 with GOLD III COPD with some reversibility and 02 dep at hs and with activity    History of Present Illness     05/01/2011 f/u ov/Wert cc 50 ft outside, one aisle at [facility], whole store with 02. No cough and minimal need for saba Seen at [facility] rec change ipatropium neb  to atrovent > no change doe. rec ry tudorza one puff twice daily and stop atrovent and see how it effects your activity performance, if not benefit ok to change back to atrovent   10/04/2011 f/u ov/Wert did not bring med calendar cc  Loosing ground on tudorza so went to atrovent neb and seemed to help. rec Plan A = use automatically no matter what Symbicort followed by Atrovent each am and symbiocort again in 12 hours plus continue the atrovent though the day in between about every 4-6 hours Only use your albuterol (xopenex hfa -puffer-  is Plan B,  Nebulizer is plan C) .      12/18/2012 f/u ov/Wert re: copd/ 02  at bedtime and with exertion  Chief Complaint  Patient presents with  . Follow-up    Increased sob at all times x1 month.  using O2 more frequently   actually no sob at rest or at hs, wears 02 with activity and when he does wear it the sats are fine even when he has to stop due to sob rec Plan A= automatic  symbiocort and tudorza twice daily and ok to use your 02 more consistently with activity that you know makes you short of breath Plan B = backup > xopenex 2 every 4 if needed if can't catch your breath Plan C = xopenex neb every 4 hours if plan B not working, always try plan B first  Please see patient coordinator before you leave today  to schedule rehab  02/14/2013 f/u ov/Wert  re:  Chief Complaint  Patient presents with  . Follow-up    Wants to make sure his medications are correct.  Pt c/o increased SOB with exertion.  easily confused with details of care, only using hfa xopenex not the neb xopenex, times right before he goes to rehab and feels he's making slow progress.  >>changed Symbicort to [facility] and atrovent neb to spiriva respimat     04/25/2013 f/u ov/Wert re:  Chief Complaint  Patient presents with  . Acute Visit    Pt c/o increased DOE for the past 3 wks- worse for the past 4-5 days. He states that he is SOB with any exertion at all such as just raising up his arms. He also c/o nasal congestion for the past 3 months. He is using xopenex HFA at least twice per day.    incxrease doe assoc with prod of more mucus and nasal congestion  but better overnight and worse as day goes on, occ green mucus but mostly white No desats walking on 02 at rehab 2lpm rec Prednisone 10 mg take  4 each am x 2 days,   2 each am x 2 days,  1 each am x 2 days and stop  Follow med calendar    06/19/2013 f/u ov/Wert re: GOLD III COPD with some reversibility on dulera 200 2bid and spriva respimat  Chief Complaint  Patient presents with  . Follow-up    Pt c/o increased DOE for the past month. He states he gets SOB with walking approx 50 ft.    using xopenex qid never neb Did get def transient  benefit from prednisone  >steroid challenge   07/17/2013 Follow up and Med review  Returns for follow up and med review . We reviewed  All his meds and organized them into a med calendar w/ pt education.  Last visit was started on prednisone 20 mg with taper down to 10 mg and hold. Patient says that his breathing has improved. We discussed steroid use, and he wishes to taper off of this due to potential side effects. No flare in cough , wheezing or fever.  Patient denies any hemoptysis, orthopnea, PND, or leg swelling.  Current Medications, Allergies, Complete Past Medical History,  Past Surgical History, Family History, and Social History were reviewed in [employer] record.  ROS  The following are not active complaints unless bolded sore throat, dysphagia, dental problems, itching, sneezing,  nasal congestion or excess/ purulent secretions, ear ache,   fever, chills, sweats, unintended wt loss, pleuritic or exertional cp, hemoptysis,  orthopnea pnd or leg swelling, presyncope, palpitations, heartburn, abdominal pain, anorexia, nausea, vomiting, diarrhea  or change in bowel or urinary habits, change in stools or urine, dysuria,hematuria,  rash, arthralgias, visual complaints, headache, numbness weakness or ataxia or problems with walking or coordination,  change in mood/affect or memory.             Past Medical History:  CHRONIC RHINITIS (ICD-472.0)  - Sinus ct March 03, 2010 > thickening only, no acute changes  CHRONIC RESPIRATORY FAILURE (ICD-518.83)  - 02 rx since 2008  COPD UNSPECIFIED (ICD-496)....................................................................[name]  - PFTs 05/04/05 FEV1 36% ratio 28% with 29% improvement after bronchodilators DLCO 48%  - PFT's 03/26/08 30% ratio 34 with 14% improvement after bronchodilators DLC0 38 %  - Start noct 02 at 2lpm 2008 and on 03/04/09 desat @ > 185 ft so rec wear with activtiy > rm to rm  - [facility] June 30, 2008 100% > confirmed March 04, 2009  ISCHEMIC HEART DISEASE (ICD-414.9)  HYPERTENSION (ICD-401.9)  HYPERLIPIDEMIA (ICD-272.4)  SPN RUL by cxr August 19, 2009  COMPLEX MED REGIMEN-Meds reviewed with pt education and computerized med calendar 11/22/09 03/10/2011  , 10/15/2012  07/17/2013           Objective:   Physical Exam  amb wm nad with nl vitals   wt 159 04/28/2010  > 10/14/2010 154  > 10/04/2011 150> 12/21/2011  150 > 149 01/29/2012 >154 04/05/2012 > 05/22/2012 148 > 09/30/2012  146 >>144> 10/15/2012 > 12/18/2012  145 > 148 02/14/2013 >148 03/14/2013 > 04/25/2013 148 > 06/19/2013 150 >>07/17/2013  >148  HEENT mild to mod nonspecific turbinate edema.  Oropharynx no thrush or excess pnd or cobblestoning.  No JVD or cervical adenopathy. Mild accessory muscle hypertrophy. Trachea midline, nl thryroid. Chest was hyperinflated by percussion with diminished breath sounds and moderate increased exp time without wheeze.   Regular rate and rhythm without murmur gallop or rub or increase P2 or edema.  Abd: no hsm, nl excursion. Ext warm without cyanosis or clubbing.       CXR  04/25/2013 :  COPD without acute abnormality.     Assessment & Plan:

## 2013-07-25 NOTE — Addendum Note (Signed)
Addended by: Parke Poisson E on: 07/25/2013 12:00 PM   Modules accepted: Orders

## 2013-07-27 ENCOUNTER — Other Ambulatory Visit: Payer: Self-pay | Admitting: Internal Medicine

## 2013-07-31 ENCOUNTER — Other Ambulatory Visit: Payer: Self-pay

## 2013-07-31 MED ORDER — METOPROLOL SUCCINATE ER 25 MG PO TB24: ORAL_TABLET | ORAL | Status: DC

## 2013-08-11 ENCOUNTER — Other Ambulatory Visit: Payer: Self-pay | Admitting: Internal Medicine

## 2013-08-12 DIAGNOSIS — K3189 Other diseases of stomach and duodenum: Secondary | ICD-10-CM | POA: Diagnosis not present

## 2013-08-12 DIAGNOSIS — M542 Cervicalgia: Secondary | ICD-10-CM | POA: Diagnosis not present

## 2013-08-12 DIAGNOSIS — I10 Essential (primary) hypertension: Secondary | ICD-10-CM | POA: Diagnosis not present

## 2013-08-12 DIAGNOSIS — R5383 Other fatigue: Secondary | ICD-10-CM | POA: Diagnosis not present

## 2013-08-12 DIAGNOSIS — R1011 Right upper quadrant pain: Secondary | ICD-10-CM | POA: Diagnosis not present

## 2013-08-12 DIAGNOSIS — R5381 Other malaise: Secondary | ICD-10-CM | POA: Diagnosis not present

## 2013-08-13 ENCOUNTER — Other Ambulatory Visit: Payer: Self-pay | Admitting: Family Medicine

## 2013-08-13 DIAGNOSIS — R1013 Epigastric pain: Secondary | ICD-10-CM

## 2013-08-13 DIAGNOSIS — R1011 Right upper quadrant pain: Secondary | ICD-10-CM

## 2013-08-14 ENCOUNTER — Ambulatory Visit: Payer: Medicare Other | Attending: Family Medicine

## 2013-08-14 DIAGNOSIS — IMO0001 Reserved for inherently not codable concepts without codable children: Secondary | ICD-10-CM | POA: Insufficient documentation

## 2013-08-14 DIAGNOSIS — J449 Chronic obstructive pulmonary disease, unspecified: Secondary | ICD-10-CM | POA: Diagnosis not present

## 2013-08-14 DIAGNOSIS — Z9981 Dependence on supplemental oxygen: Secondary | ICD-10-CM | POA: Insufficient documentation

## 2013-08-14 DIAGNOSIS — M2560 Stiffness of unspecified joint, not elsewhere classified: Secondary | ICD-10-CM | POA: Diagnosis not present

## 2013-08-14 DIAGNOSIS — Z951 Presence of aortocoronary bypass graft: Secondary | ICD-10-CM | POA: Insufficient documentation

## 2013-08-14 DIAGNOSIS — J4489 Other specified chronic obstructive pulmonary disease: Secondary | ICD-10-CM | POA: Insufficient documentation

## 2013-08-14 DIAGNOSIS — M542 Cervicalgia: Secondary | ICD-10-CM | POA: Insufficient documentation

## 2013-08-15 ENCOUNTER — Ambulatory Visit
Admission: RE | Admit: 2013-08-15 | Discharge: 2013-08-15 | Disposition: A | Discharge status: Home / Self Care | Payer: Medicare Other | Source: Ambulatory Visit | Attending: Family Medicine | Admitting: Family Medicine

## 2013-08-15 DIAGNOSIS — N200 Calculus of kidney: Secondary | ICD-10-CM | POA: Diagnosis not present

## 2013-08-15 DIAGNOSIS — R1011 Right upper quadrant pain: Secondary | ICD-10-CM

## 2013-08-15 DIAGNOSIS — R1013 Epigastric pain: Secondary | ICD-10-CM

## 2013-08-20 DIAGNOSIS — H2510 Age-related nuclear cataract, unspecified eye: Secondary | ICD-10-CM | POA: Diagnosis not present

## 2013-08-20 DIAGNOSIS — Z961 Presence of intraocular lens: Secondary | ICD-10-CM | POA: Diagnosis not present

## 2013-08-27 ENCOUNTER — Encounter: Payer: Self-pay | Admitting: Internal Medicine

## 2013-08-27 ENCOUNTER — Ambulatory Visit (INDEPENDENT_AMBULATORY_CARE_PROVIDER_SITE_OTHER): Payer: Medicare Other | Admitting: Internal Medicine

## 2013-08-27 VITALS — BP 140/62 | HR 74 | Ht 69.0 in | Wt 147.6 lb

## 2013-08-27 DIAGNOSIS — J449 Chronic obstructive pulmonary disease, unspecified: Secondary | ICD-10-CM | POA: Diagnosis not present

## 2013-08-27 DIAGNOSIS — J9610 Chronic respiratory failure, unspecified whether with hypoxia or hypercapnia: Secondary | ICD-10-CM

## 2013-08-27 DIAGNOSIS — J9611 Chronic respiratory failure with hypoxia: Secondary | ICD-10-CM

## 2013-08-27 DIAGNOSIS — R0902 Hypoxemia: Secondary | ICD-10-CM | POA: Diagnosis not present

## 2013-08-27 DIAGNOSIS — I6529 Occlusion and stenosis of unspecified carotid artery: Secondary | ICD-10-CM | POA: Diagnosis not present

## 2013-08-27 DIAGNOSIS — Z23 Encounter for immunization: Secondary | ICD-10-CM

## 2013-08-27 MED ORDER — PREDNISONE 10 MG PO TABS: ORAL_TABLET | ORAL | Status: DC

## 2013-08-27 MED ORDER — AZITHROMYCIN 250 MG PO TABS: ORAL_TABLET | ORAL | Status: DC

## 2013-08-27 NOTE — Patient Instructions (Addendum)
Try using your xopenex hfa  before planned walk on your treadmill to see what difference if any this makes  prevnar 13 given today  Resume prednisone 10 mg x 2 daily when xopenx is not lasting 4 hours, then 1 daily x 5 days and stop   Please schedule a follow up visit in 3 months but call sooner if needed with [facility]

## 2013-08-27 NOTE — Assessment & Plan Note (Signed)
-   PFTs 05/04/05 FEV1 36% ratio 28% with 29% improvement after bronchodilators DLCO 48%  - PFT's 03/26/08 30% ratio 34 with 14% improvement after bronchodilators DLC0 38 %  - Referred to rehab 12/19/2012 > completed March 2015  - changed to spiriva respimat 02/14/2013  - 02/14/2013 p extensive coaching HFA effectiveness =    90%  - started daily prednisone 06/19/13 > improved only a little> tapered off mid July 2015 > ok to change to prn prednisone - see instructions    Each maintenance medication was reviewed in detail including most importantly the difference between maintenance and as needed and under what circumstances the prns are to be used. This was done in the context of a medication calendar review which provided the patient with a user-friendly unambiguous mechanism for medication administration and reconciliation and provides an action plan for all active problems. It is critical that this be shown to every doctor  for modification during the office visit if necessary so the patient can use it as a working document.

## 2013-08-27 NOTE — Assessment & Plan Note (Signed)
-   Start noct 02 at 2lpm 2008 and on 03/04/09 desat @ > 185 ft so rec wear with activtiy > rm to rm  - 02/15/2011   Walked RA x one lap @ 185 stopped due to  desat > corrected on 2lpm> pt reports sats never less than 90% on this rx    05/23/2012  rx =  02 2lpm at hs and with activity > room to room  Needs to learn to pace better and use xopenex before ex  See instructions for specific recommendations which were reviewed directly with the patient who was given a copy with highlighter outlining the key components.

## 2013-08-27 NOTE — Progress Notes (Signed)
Spoke with pt and notified of recs per MW He verbalized understanding and rx was sent to pharm  Subjective:    Patient ID: [name], male    DOB: 24-Oct-1929   MRN: [id number]     Summary: Pulmonary/ f/u ov try astepro for pnds   Primary Provider/Referring Provider: [name]    Brief patient profile:  35   yowm last smoked 1983 with GOLD III COPD with some reversibility and 02 dep at hs and with activity    History of Present Illness   05/01/2011 f/u ov/[name] cc 50 ft outside, one aisle at [facility], whole store with 02. No cough and minimal need for saba Seen at [name] rec change ipatropium neb  to atrovent > no change doe. rec ry tudorza one puff twice daily and stop atrovent and see how it effects your activity performance, if not benefit ok to change back to atrovent   10/04/2011 f/u ov/[name] did not bring med calendar cc  Loosing ground on tudorza so went to atrovent neb and seemed to help. rec Plan A = use automatically no matter what Symbicort followed by Atrovent each am and symbiocort again in 12 hours plus continue the atrovent though the day in between about every 4-6 hours Only use your albuterol (xopenex hfa -puffer-  is Plan B,  Nebulizer is plan C) .      12/18/2012 f/u ov/[name] re: copd/ 02  at bedtime and with exertion  Chief Complaint  Patient presents with  . Follow-up    Increased sob at all times x1 month.  using O2 more frequently   actually no sob at rest or at hs, wears 02 with activity and when he does wear it the sats are fine even when he has to stop due to sob rec Plan A= automatic  symbiocort and tudorza twice daily and ok to use your 02 more consistently with activity that you know makes you short of breath Plan B = backup > xopenex 2 every 4 if needed if can't catch your breath Plan C = xopenex neb every 4 hours if plan B not working, always try plan B first  Please see patient coordinator before you leave today  to schedule rehab  02/14/2013 f/u ov/[name] re:   Chief Complaint  Patient presents with  . Follow-up    Wants to make sure his medications are correct.  Pt c/o increased SOB with exertion.  easily confused with details of care, only using hfa xopenex not the neb xopenex, times right before he goes to rehab and feels he's making slow progress.  >>changed Symbicort to [facility] and atrovent neb to spiriva respimat     04/25/2013 f/u ov/[name] re:  Chief Complaint  Patient presents with  . Acute Visit    Pt c/o increased DOE for the past 3 wks- worse for the past 4-5 days. He states that he is SOB with any exertion at all such as just raising up his arms. He also c/o nasal congestion for the past 3 months. He is using xopenex HFA at least twice per day.    incxrease doe assoc with prod of more mucus and nasal congestion  but better overnight and worse as day goes on, occ green mucus but mostly white No desats walking on 02 at rehab 2lpm rec Prednisone 10 mg take  4 each am x 2 days,   2 each am x 2 days,  1 each am x 2 days and stop  Follow  med calendar    06/19/2013 f/u ov/[name] re: GOLD III COPD with some reversibility on dulera 200 2bid and spriva respimat  Chief Complaint  Patient presents with  . Follow-up    Pt c/o increased DOE for the past month. He states he gets SOB with walking approx 50 ft.    using xopenex qid never neb Did get def transient  benefit from prednisone  >steroid challenge   07/17/2013 Follow up and Med review  Returns for follow up and med review . We reviewed  All his meds and organized them into a med calendar w/ pt education.  Last visit was started on prednisone 20 mg with taper down to 10 mg and hold. Patient says that his breathing has improved. We discussed steroid use, and he wishes to taper off of this due to potential side effects.  rec Follow med calendar closely and bring to each visit.  Decrease prednisone 10mg  1/2 daily for 1 week then 1/2 every other day for 1 week and then stop.  May use saline  nasal spray and gel As needed  For congestion .    08/27/2013 f/u ov/[name] re: copd/ 02 dep with sleep/ ex / on symbicort and spiriva  Chief Complaint  Patient presents with  . Follow-up    ROV : Pt c/o incr SOB and states that he has had a recent decrease in activity--does not feel like his stamina/endurance is where it needs to be.   Variable doe  seemed some better on prednisone - never sob at rest or sleeping  Wears 02 when sleeps at 2 and exercises at 2lpm on treadmill at 1.2 mph level, goes 5 min rest 1 min Has not tried pre treating with xopenex before he does the treadmill, never tried slow setting, never uses neb xopenex        No obvious day to day or daytime variabilty or assoc chronic cough or cp or chest tightness, subjective wheeze overt sinus or hb symptoms. No unusual exp hx or h/o childhood pna/ asthma or knowledge of premature birth.  Sleeping ok without nocturnal  or early am exacerbation  of respiratory  c/o's or need for noct saba. Also denies any obvious fluctuation of symptoms with weather or environmental changes or other aggravating or alleviating factors except as outlined above   Current Medications, Allergies, Complete Past Medical History, Past Surgical History, Family History, and Social History were reviewed in [employer] record.  ROS  The following are not active complaints unless bolded sore throat, dysphagia, dental problems, itching, sneezing,  nasal congestion or excess/ purulent secretions, ear ache,   fever, chills, sweats, unintended wt loss, pleuritic or exertional cp, hemoptysis,  orthopnea pnd or leg swelling, presyncope, palpitations, heartburn, abdominal pain, anorexia, nausea, vomiting, diarrhea  or change in bowel or urinary habits, change in stools or urine, dysuria,hematuria,  rash, arthralgias, visual complaints, headache, numbness weakness or ataxia or problems with walking or coordination,  change in mood/affect or  memory.             Past Medical History:  CHRONIC RHINITIS (ICD-472.0)  - Sinus ct March 03, 2010 > thickening only, no acute changes  CHRONIC RESPIRATORY FAILURE (ICD-518.83)  - 02 rx since 2008  COPD UNSPECIFIED (ICD-496)....................................................................[name]  - PFTs 05/04/05 FEV1 36% ratio 28% with 29% improvement after bronchodilators DLCO 48%  - PFT's 03/26/08 30% ratio 34 with 14% improvement after bronchodilators DLC0 38 %  - Start noct 02 at 2lpm 2008  and on 03/04/09 desat @ > 185 ft so rec wear with activtiy > rm to rm  - [facility] June 30, 2008 100% > confirmed March 04, 2009  ISCHEMIC HEART DISEASE (ICD-414.9)  HYPERTENSION (ICD-401.9)  HYPERLIPIDEMIA (ICD-272.4)  SPN RUL by cxr August 19, 2009  COMPLEX MED REGIMEN-Meds reviewed with pt education and computerized med calendar 11/22/09 03/10/2011  , 10/15/2012  07/17/2013           Objective:   Physical Exam  amb wm nad with nl vitals   wt 159 04/28/2010  > 10/14/2010 154  > 10/04/2011 150> 12/21/2011  150 > 149 01/29/2012 >154 04/05/2012 > 05/22/2012 148 > 09/30/2012  146 >>144> 10/15/2012 > 12/18/2012  145 > 148 02/14/2013 >148 03/14/2013 > 04/25/2013 148 > 06/19/2013 150 >>07/17/2013 > 08/27/2013 148   HEENT mild to mod nonspecific turbinate edema.  Oropharynx no thrush or excess pnd or cobblestoning.  No JVD or cervical adenopathy. Mild accessory muscle hypertrophy. Trachea midline, nl thryroid. Chest was hyperinflated by percussion with diminished breath sounds and moderate increased exp time without wheeze.   Regular rate and rhythm without murmur gallop or rub or increase P2 or edema.  Abd: no hsm, nl excursion. Ext warm without cyanosis or clubbing.       CXR  04/25/2013 : COPD without acute abnormality.     Assessment & Plan:

## 2013-08-29 ENCOUNTER — Telehealth: Payer: Self-pay | Admitting: Internal Medicine

## 2013-08-29 NOTE — Telephone Encounter (Signed)
I filled out this form from [place]. Pl ensure that such calls are taken care of during routine office hours

## 2013-08-29 NOTE — Telephone Encounter (Signed)
Forms have been faxed over for this pt.  MW will need to sign these asap and these will need to be faxed back.  These were not faxed to the triage fax.  Will forward to elise to follow up on. thanks

## 2013-08-30 NOTE — Telephone Encounter (Signed)
Please look into what [employer] is insisting on submitting/ processing  paperwork for chronic 02 late on a Friday afternoon and identify what the problem - this should never happen

## 2013-09-01 ENCOUNTER — Other Ambulatory Visit: Payer: Self-pay | Admitting: Emergency Medicine

## 2013-09-01 NOTE — Telephone Encounter (Signed)
lmtcb x 1 for [facility]) Need to know if anything further is needed from our office.  Need to ensure that all requested supplies given to patient. Need to follow-up on and inquire why this was addressed late on a Friday afternoon for chronic O2 issues.

## 2013-09-02 NOTE — Telephone Encounter (Signed)
Will forward to [employer] [place] to follow up on.

## 2013-09-02 NOTE — Telephone Encounter (Signed)
LMTCB for [employer]

## 2013-09-09 ENCOUNTER — Telehealth: Payer: Self-pay | Admitting: Internal Medicine

## 2013-09-09 NOTE — Telephone Encounter (Signed)
Called made pt aware of recs. Nothing further needed 

## 2013-09-09 NOTE — Telephone Encounter (Signed)
Won't hurt to use spiriva now and just resume daily dosing in am but can always use the xopenex every 4 hours prn if can't get comfortable at rest regardless of reason

## 2013-09-09 NOTE — Telephone Encounter (Signed)
Pt states he cannot remember if he took his spiriva respimat this AM or not. He states he is feeling like he is having some increased SOB this afternoon. He wanted to know can he take it now? Will it be harmful to take a double dose if he did take it this morning? Or should he use xopenex instead and restart spiriva in the morning? Please advise. [name], CMA

## 2013-09-19 ENCOUNTER — Other Ambulatory Visit: Payer: Self-pay | Admitting: Adult Health

## 2013-10-01 DIAGNOSIS — Z961 Presence of intraocular lens: Secondary | ICD-10-CM | POA: Diagnosis not present

## 2013-10-01 DIAGNOSIS — H2589 Other age-related cataract: Secondary | ICD-10-CM | POA: Diagnosis not present

## 2013-10-13 ENCOUNTER — Other Ambulatory Visit (HOSPITAL_COMMUNITY): Payer: Self-pay | Admitting: Family Medicine

## 2013-10-13 DIAGNOSIS — R109 Unspecified abdominal pain: Secondary | ICD-10-CM

## 2013-10-13 DIAGNOSIS — E559 Vitamin D deficiency, unspecified: Secondary | ICD-10-CM

## 2013-10-14 DIAGNOSIS — Z23 Encounter for immunization: Secondary | ICD-10-CM | POA: Diagnosis not present

## 2013-10-14 DIAGNOSIS — R1013 Epigastric pain: Secondary | ICD-10-CM | POA: Diagnosis not present

## 2013-10-14 DIAGNOSIS — R109 Unspecified abdominal pain: Secondary | ICD-10-CM | POA: Diagnosis not present

## 2013-10-21 DIAGNOSIS — H2512 Age-related nuclear cataract, left eye: Secondary | ICD-10-CM | POA: Diagnosis not present

## 2013-10-23 ENCOUNTER — Encounter: Payer: Self-pay | Admitting: Internal Medicine

## 2013-10-23 ENCOUNTER — Ambulatory Visit (INDEPENDENT_AMBULATORY_CARE_PROVIDER_SITE_OTHER): Payer: Medicare Other | Admitting: Internal Medicine

## 2013-10-23 VITALS — BP 128/78 | HR 68 | Temp 97.1°F | Ht 69.0 in | Wt 141.6 lb

## 2013-10-23 DIAGNOSIS — J9611 Chronic respiratory failure with hypoxia: Secondary | ICD-10-CM

## 2013-10-23 DIAGNOSIS — I6529 Occlusion and stenosis of unspecified carotid artery: Secondary | ICD-10-CM

## 2013-10-23 DIAGNOSIS — J449 Chronic obstructive pulmonary disease, unspecified: Secondary | ICD-10-CM | POA: Diagnosis not present

## 2013-10-23 NOTE — Progress Notes (Signed)
Spoke with pt and notified of recs per MW He verbalized understanding and rx was sent to pharm  Subjective:    Patient ID: [name], male    DOB: 1929/11/10   MRN: [id number]     Summary: Pulmonary/ f/u ov try astepro for pnds   Primary Provider/Referring Provider: [name]    Brief patient profile:  57   yowm last smoked 1983 with GOLD III COPD with some reversibility and 02 dep at hs and with activity    History of Present Illness   05/01/2011 f/u ov/Sherlon Nied cc 50 ft outside, one aisle at [facility], whole store with 02. No cough and minimal need for saba Seen at [facility] rec change ipatropium neb  to atrovent > no change doe. rec ry tudorza one puff twice daily and stop atrovent and see how it effects your activity performance, if not benefit ok to change back to atrovent   10/04/2011 f/u ov/Trellis Vanoverbeke did not bring med calendar cc  Loosing ground on tudorza so went to atrovent neb and seemed to help. rec Plan A = use automatically no matter what Symbicort followed by Atrovent each am and symbiocort again in 12 hours plus continue the atrovent though the day in between about every 4-6 hours Only use your albuterol (xopenex hfa -puffer-  is Plan B,  Nebulizer is plan C) .      12/18/2012 f/u ov/Lurleen Soltero re: copd/ 02  at bedtime and with exertion  Chief Complaint  Patient presents with  . Follow-up    Increased sob at all times x1 month.  using O2 more frequently   actually no sob at rest or at hs, wears 02 with activity and when he does wear it the sats are fine even when he has to stop due to sob rec Plan A= automatic  symbiocort and tudorza twice daily and ok to use your 02 more consistently with activity that you know makes you short of breath Plan B = backup > xopenex 2 every 4 if needed if can't catch your breath Plan C = xopenex neb every 4 hours if plan B not working, always try plan B first  Please see patient coordinator before you leave today  to schedule rehab  02/14/2013 f/u ov/Logyn Dedominicis re:   Chief Complaint  Patient presents with  . Follow-up    Wants to make sure his medications are correct.  Pt c/o increased SOB with exertion.  easily confused with details of care, only using hfa xopenex not the neb xopenex, times right before he goes to rehab and feels he's making slow progress.  >>changed Symbicort to [facility] and atrovent neb to spiriva respimat     04/25/2013 f/u ov/Samad Thon re:  Chief Complaint  Patient presents with  . Acute Visit    Pt c/o increased DOE for the past 3 wks- worse for the past 4-5 days. He states that he is SOB with any exertion at all such as just raising up his arms. He also c/o nasal congestion for the past 3 months. He is using xopenex HFA at least twice per day.    incxrease doe assoc with prod of more mucus and nasal congestion  but better overnight and worse as day goes on, occ green mucus but mostly white No desats walking on 02 at rehab 2lpm rec Prednisone 10 mg take  4 each am x 2 days,   2 each am x 2 days,  1 each am x 2 days and stop  Follow  med calendar    06/19/2013 f/u ov/Dianah Pruett re: GOLD III COPD with some reversibility on dulera 200 2bid and spriva respimat  Chief Complaint  Patient presents with  . Follow-up    Pt c/o increased DOE for the past month. He states he gets SOB with walking approx 50 ft.    using xopenex qid never neb Did get def transient  benefit from prednisone  >steroid challenge   07/17/2013 Follow up and Med review  Returns for follow up and med review . We reviewed  All his meds and organized them into a med calendar w/ pt education.  Last visit was started on prednisone 20 mg with taper down to 10 mg and hold. Patient says that his breathing has improved. We discussed steroid use, and he wishes to taper off of this due to potential side effects.  rec Follow med calendar closely and bring to each visit.  Decrease prednisone 10mg  1/2 daily for 1 week then 1/2 every other day for 1 week and then stop.  May use saline  nasal spray and gel As needed  For congestion .    08/27/2013 f/u ov/Chen Holzman re: copd/ 02 dep with sleep/ ex / on symbicort and spiriva  Chief Complaint  Patient presents with  . Follow-up    ROV : Pt c/o incr SOB and states that he has had a recent decrease in activity--does not feel like his stamina/endurance is where it needs to be.   Variable doe  seemed some better on prednisone - never sob at rest or sleeping  Wears 02 when sleeps at 2 and exercises at 2lpm on treadmill at 1.2 mph level, goes 5 min rest 1 min Has not tried pre treating with xopenex before he does the treadmill, never tried slow setting, never uses neb xopenex rec Try using your xopenex hfa  before planned walk on your treadmill to see what difference if any this makes Prevnar 13 given today Resume prednisone 10 mg x 2 daily when xopenx is not lasting 4 hours, then 1 daily x 5 days and stop      10/23/2013 f/u ov/Apostolos Blagg re: copd/02 dep on symb/spiriva/ xopenex prn plus back up pred short course Chief Complaint  Patient presents with  . Follow-up    SOB   feels prednisone helped a lot with sob and less need for saba Able do treadmill x 15 min x 1 mph no grade   No obvious day to day or daytime variabilty or assoc chronic cough or cp or chest tightness, subjective wheeze overt sinus or hb symptoms. No unusual exp hx or h/o childhood pna/ asthma or knowledge of premature birth.  Sleeping ok without nocturnal  or early am exacerbation  of respiratory  c/o's or need for noct saba. Also denies any obvious fluctuation of symptoms with weather or environmental changes or other aggravating or alleviating factors except as outlined above   Current Medications, Allergies, Complete Past Medical History, Past Surgical History, Family History, and Social History were reviewed in [employer] record.  ROS  The following are not active complaints unless bolded sore throat, dysphagia, dental problems, itching,  sneezing,  nasal congestion or excess/ purulent secretions, ear ache,   fever, chills, sweats, unintended wt loss, pleuritic or exertional cp, hemoptysis,  orthopnea pnd or leg swelling, presyncope, palpitations, heartburn, abdominal pain, anorexia, nausea, vomiting, diarrhea  or change in bowel or urinary habits, change in stools or urine, dysuria,hematuria,  rash, arthralgias, visual complaints, headache,  numbness weakness or ataxia or problems with walking or coordination,  change in mood/affect or memory.             Past Medical History:  CHRONIC RHINITIS (ICD-472.0)  - Sinus ct March 03, 2010 > thickening only, no acute changes  CHRONIC RESPIRATORY FAILURE (ICD-518.83)  - 02 rx since 2008  COPD UNSPECIFIED (ICD-496)....................................................................[name]  - PFTs 05/04/05 FEV1 36% ratio 28% with 29% improvement after bronchodilators DLCO 48%  - PFT's 03/26/08 30% ratio 34 with 14% improvement after bronchodilators DLC0 38 %  - Start noct 02 at 2lpm 2008 and on 03/04/09 desat @ > 185 ft so rec wear with activtiy > rm to rm  - [facility] June 30, 2008 100% > confirmed March 04, 2009  ISCHEMIC HEART DISEASE (ICD-414.9)  HYPERTENSION (ICD-401.9)  HYPERLIPIDEMIA (ICD-272.4)  SPN RUL by cxr August 19, 2009  COMPLEX MED REGIMEN-Meds reviewed with pt education and computerized med calendar 11/22/09 03/10/2011  , 10/15/2012  07/17/2013           Objective:   Physical Exam  amb wm nad with nl vitals   wt 159 04/28/2010  > 10/14/2010 154  > 10/04/2011 150> 12/21/2011  150 > 149 01/29/2012 >154 04/05/2012 > 05/22/2012 148 > 09/30/2012  146 >>144> 10/15/2012 > 12/18/2012  145 > 148 02/14/2013 >148 03/14/2013 > 04/25/2013 148 > 06/19/2013 150 >>07/17/2013 > 08/27/2013 148 > 10/23/2013  148   HEENT mild to mod nonspecific turbinate edema.  Oropharynx no thrush or excess pnd or cobblestoning.  No JVD or cervical adenopathy. Mild accessory muscle hypertrophy. Trachea midline, nl thryroid.  Chest was hyperinflated by percussion with diminished breath sounds and moderate increased exp time without wheeze.   Regular rate and rhythm without murmur gallop or rub or increase P2 or edema.  Abd: no hsm, nl excursion. Ext warm without cyanosis or clubbing.       CXR  04/25/2013 : COPD without acute abnormality.     Assessment & Plan:

## 2013-10-23 NOTE — Assessment & Plan Note (Signed)
-   PFTs 05/04/05 FEV1 36% ratio 28% with 29% improvement after bronchodilators DLCO 48%  - PFT's 03/26/08 30% ratio 34 with 14% improvement after bronchodilators DLC0 38 %  - Referred to rehab 12/19/2012 > completed March 2015  - changed to spiriva respimat 02/14/2013  - 02/14/2013 p extensive coaching HFA effectiveness =    90%  - started daily prednisone 06/19/13 > improved only a little> tapered off mid July 2015 > ok to change to prn prednisone 08/27/2013     Each maintenance medication was reviewed in detail including most importantly the difference between maintenance and as needed and under what circumstances the prns are to be used. This was done in the context of a medication calendar review which provided the patient with a user-friendly unambiguous mechanism for medication administration and reconciliation and provides an action plan for all active problems. It is critical that this be shown to every doctor  for modification during the office visit if necessary so the patient can use it as a working document.

## 2013-10-23 NOTE — Assessment & Plan Note (Signed)
-   Start noct 02 at 2lpm 2008 and on 03/04/09 desat @ > 185 ft so rec wear with activtiy > rm to rm  - 02/15/2011   Walked RA x one lap @ 185 stopped due to  desat > corrected on 2lpm   05/23/2012  rx =  02 2lpm at hs and with activity > room to room  10/23/2013 ok to self titrate to over 90%

## 2013-10-23 NOTE — Patient Instructions (Addendum)
See calendar for specific medication instructions and bring it back for each and every office visit for every healthcare provider you see.  Without it,  you may not receive the best quality medical care that we feel you deserve.  You will note that the calendar groups together  your maintenance  medications that are timed at particular times of the day.  Think of this as your checklist for what your doctor has instructed you to do until your next evaluation to see what benefit  there is  to staying on a consistent group of medications intended to keep you well.  The other group at the bottom is entirely up to you to use as you see fit  for specific symptoms that may arise between visits that require you to treat them on an as needed basis.  Think of this as your action plan or "what if" list.   Separating the top medications from the bottom group is fundamental to providing you adequate care going forward.    See Tammy NP  6 weeks with all your medications, even over the counter meds, separated in two separate bags, the ones you take no matter what vs the ones you stop once you feel better and take only as needed when you feel you need them.   Tammy  will generate for you a new user friendly medication calendar that will put [place] all on the same page re: your medication use.     Without this process, it simply isn't possible to assure that we are providing  your outpatient care  with  the attention to detail we feel you deserve.   If we cannot assure that you're getting that kind of care,  then we cannot manage your problem effectively from this clinic.  Once you have seen Tammy and we are sure that we're all on the same page with your medication use she will arrange follow up with me.

## 2013-10-27 DIAGNOSIS — H2512 Age-related nuclear cataract, left eye: Secondary | ICD-10-CM | POA: Diagnosis not present

## 2013-10-27 DIAGNOSIS — H25042 Posterior subcapsular polar age-related cataract, left eye: Secondary | ICD-10-CM | POA: Diagnosis not present

## 2013-10-27 DIAGNOSIS — H25011 Cortical age-related cataract, right eye: Secondary | ICD-10-CM | POA: Diagnosis not present

## 2013-11-04 DIAGNOSIS — R972 Elevated prostate specific antigen [PSA]: Secondary | ICD-10-CM | POA: Diagnosis not present

## 2013-11-11 DIAGNOSIS — N403 Nodular prostate with lower urinary tract symptoms: Secondary | ICD-10-CM | POA: Diagnosis not present

## 2013-11-11 DIAGNOSIS — R3915 Urgency of urination: Secondary | ICD-10-CM | POA: Diagnosis not present

## 2013-11-11 DIAGNOSIS — R972 Elevated prostate specific antigen [PSA]: Secondary | ICD-10-CM | POA: Diagnosis not present

## 2013-11-18 ENCOUNTER — Other Ambulatory Visit: Payer: Self-pay | Admitting: Interventional Cardiology

## 2013-11-18 ENCOUNTER — Other Ambulatory Visit: Payer: Self-pay | Admitting: Internal Medicine

## 2013-12-09 ENCOUNTER — Ambulatory Visit (INDEPENDENT_AMBULATORY_CARE_PROVIDER_SITE_OTHER): Payer: Medicare Other | Admitting: Adult Health

## 2013-12-09 ENCOUNTER — Encounter: Payer: Self-pay | Admitting: Adult Health

## 2013-12-09 VITALS — BP 134/74 | HR 76 | Temp 97.0°F | Ht 69.0 in | Wt 153.0 lb

## 2013-12-09 DIAGNOSIS — J310 Chronic rhinitis: Secondary | ICD-10-CM

## 2013-12-09 DIAGNOSIS — J9611 Chronic respiratory failure with hypoxia: Secondary | ICD-10-CM | POA: Diagnosis not present

## 2013-12-09 DIAGNOSIS — J449 Chronic obstructive pulmonary disease, unspecified: Secondary | ICD-10-CM

## 2013-12-09 NOTE — Progress Notes (Signed)
Spoke with pt and notified of recs per MW He verbalized understanding and rx was sent to pharm  Subjective:    Patient ID: [name], male    DOB: 05/11/1929   MRN: [id number]     Summary: Pulmonary/ f/u ov try astepro for pnds   Primary Provider/Referring Provider: [name]    Brief patient profile:  43   yowm last smoked 1983 with GOLD III COPD with some reversibility and 02 dep at hs and with activity    History of Present Illness   05/01/2011 f/u ov/Wert cc 50 ft outside, one aisle at [facility], whole store with 02. No cough and minimal need for saba Seen at [facility] rec change ipatropium neb  to atrovent > no change doe. rec ry tudorza one puff twice daily and stop atrovent and see how it effects your activity performance, if not benefit ok to change back to atrovent   10/04/2011 f/u ov/Wert did not bring med calendar cc  Loosing ground on tudorza so went to atrovent neb and seemed to help. rec Plan A = use automatically no matter what Symbicort followed by Atrovent each am and symbiocort again in 12 hours plus continue the atrovent though the day in between about every 4-6 hours Only use your albuterol (xopenex hfa -puffer-  is Plan B,  Nebulizer is plan C) .      12/18/2012 f/u ov/Wert re: copd/ 02  at bedtime and with exertion  Chief Complaint  Patient presents with  . Follow-up    Increased sob at all times x1 month.  using O2 more frequently   actually no sob at rest or at hs, wears 02 with activity and when he does wear it the sats are fine even when he has to stop due to sob rec Plan A= automatic  symbiocort and tudorza twice daily and ok to use your 02 more consistently with activity that you know makes you short of breath Plan B = backup > xopenex 2 every 4 if needed if can't catch your breath Plan C = xopenex neb every 4 hours if plan B not working, always try plan B first  Please see patient coordinator before you leave today  to schedule rehab  02/14/2013 f/u ov/Wert re:   Chief Complaint  Patient presents with  . Follow-up    Wants to make sure his medications are correct.  Pt c/o increased SOB with exertion.  easily confused with details of care, only using hfa xopenex not the neb xopenex, times right before he goes to rehab and feels he's making slow progress.  >>changed Symbicort to [facility] and atrovent neb to spiriva respimat     04/25/2013 f/u ov/Wert re:  Chief Complaint  Patient presents with  . Acute Visit    Pt c/o increased DOE for the past 3 wks- worse for the past 4-5 days. He states that he is SOB with any exertion at all such as just raising up his arms. He also c/o nasal congestion for the past 3 months. He is using xopenex HFA at least twice per day.    incxrease doe assoc with prod of more mucus and nasal congestion  but better overnight and worse as day goes on, occ green mucus but mostly white No desats walking on 02 at rehab 2lpm rec Prednisone 10 mg take  4 each am x 2 days,   2 each am x 2 days,  1 each am x 2 days and stop  Follow  med calendar    06/19/2013 f/u ov/Wert re: GOLD III COPD with some reversibility on dulera 200 2bid and spriva respimat  Chief Complaint  Patient presents with  . Follow-up    Pt c/o increased DOE for the past month. He states he gets SOB with walking approx 50 ft.    using xopenex qid never neb Did get def transient  benefit from prednisone  >steroid challenge   07/17/2013 Follow up and Med review  Returns for follow up and med review . We reviewed  All his meds and organized them into a med calendar w/ pt education.  Last visit was started on prednisone 20 mg with taper down to 10 mg and hold. Patient says that his breathing has improved. We discussed steroid use, and he wishes to taper off of this due to potential side effects.  rec Follow med calendar closely and bring to each visit.  Decrease prednisone 10mg  1/2 daily for 1 week then 1/2 every other day for 1 week and then stop.  May use saline  nasal spray and gel As needed  For congestion .    08/27/2013 f/u ov/Wert re: copd/ 02 dep with sleep/ ex / on symbicort and spiriva  Chief Complaint  Patient presents with  . Follow-up    ROV : Pt c/o incr SOB and states that he has had a recent decrease in activity--does not feel like his stamina/endurance is where it needs to be.   Variable doe  seemed some better on prednisone - never sob at rest or sleeping  Wears 02 when sleeps at 2 and exercises at 2lpm on treadmill at 1.2 mph level, goes 5 min rest 1 min Has not tried pre treating with xopenex before he does the treadmill, never tried slow setting, never uses neb xopenex rec Try using your xopenex hfa  before planned walk on your treadmill to see what difference if any this makes Prevnar 13 given today Resume prednisone 10 mg x 2 daily when xopenx is not lasting 4 hours, then 1 daily x 5 days and stop      10/23/2013 f/u ov/Wert re: copd/02 dep on symb/spiriva/ xopenex prn plus back up pred short course Chief Complaint  Patient presents with  . Follow-up    SOB   feels prednisone helped a lot with sob and less need for saba Able do treadmill x 15 min x 1 mph no gradeive wheeze overt sinus or hb symptoms. No unusual exp hx or h/o childhood pna/ asthma or knowledge of premature birth.  12/09/2013 Follow up and Med Review  Patient returns for a follow-up and medication review. We Reviewed all his medications. Organized them into a medication calendar with pt education.  Appears be taking his medications correctly.  Patient states overall he is doing okay. Has noticed some intermittent increased dyspnea on exertion over the last few weeks. He denies any chest pain, orthopnea, PND, leg swelling, wheezing, tightness or discolored mucus.   Current Medications, Allergies, Complete Past Medical History, Past Surgical History, Family History, and Social History were reviewed in [employer] record.  ROS  The  following are not active complaints unless bolded sore throat, dysphagia, dental problems, itching, sneezing,  nasal congestion or excess/ purulent secretions, ear ache,   fever, chills, sweats, unintended wt loss, pleuritic or exertional cp, hemoptysis,  orthopnea pnd or leg swelling, presyncope, palpitations, heartburn, abdominal pain, anorexia, nausea, vomiting, diarrhea  or change in bowel or urinary habits, change in  stools or urine, dysuria,hematuria,  rash, arthralgias, visual complaints, headache, numbness weakness or ataxia or problems with walking or coordination,  change in mood/affect or memory.             Past Medical History:  CHRONIC RHINITIS (ICD-472.0)  - Sinus ct March 03, 2010 > thickening only, no acute changes  CHRONIC RESPIRATORY FAILURE (ICD-518.83)  - 02 rx since 2008  COPD UNSPECIFIED (ICD-496)....................................................................[name]  - PFTs 05/04/05 FEV1 36% ratio 28% with 29% improvement after bronchodilators DLCO 48%  - PFT's 03/26/08 30% ratio 34 with 14% improvement after bronchodilators DLC0 38 %  - Start noct 02 at 2lpm 2008 and on 03/04/09 desat @ > 185 ft so rec wear with activtiy > rm to rm  - [facility] June 30, 2008 100% > confirmed March 04, 2009  ISCHEMIC HEART DISEASE (ICD-414.9)  HYPERTENSION (ICD-401.9)  HYPERLIPIDEMIA (ICD-272.4)  SPN RUL by cxr August 19, 2009  COMPLEX MED REGIMEN-Meds reviewed with pt education and computerized med calendar 11/22/09 03/10/2011  , 10/15/2012  07/17/2013 , 12/09/2013           Objective:   Physical Exam  amb wm nad with nl vitals   wt 159 04/28/2010  > 10/14/2010 154  > 10/04/2011 150> 12/21/2011  150 > 149 01/29/2012 >154 04/05/2012 > 05/22/2012 148 > 09/30/2012  146 >>144> 10/15/2012 > 12/18/2012  145 > 148 02/14/2013 >148 03/14/2013 > 04/25/2013 148 > 06/19/2013 150 >>07/17/2013 > 08/27/2013 148 > 10/23/2013  148 >153 12/09/2013  HEENT mild to mod nonspecific turbinate edema.  Oropharynx no thrush or  excess pnd or cobblestoning.  No JVD or cervical adenopathy. Mild accessory muscle hypertrophy. Trachea midline, nl thryroid. Chest was hyperinflated by percussion with diminished breath sounds and moderate increased exp time without wheeze.   Regular rate and rhythm without murmur gallop or rub or increase P2 or edema.  Abd: no hsm, nl excursion. Ext warm without cyanosis or clubbing.       CXR  04/25/2013 : COPD without acute abnormality.     Assessment & Plan:

## 2013-12-09 NOTE — Assessment & Plan Note (Signed)
Controlled on rx   

## 2013-12-09 NOTE — Assessment & Plan Note (Signed)
Cont on O2 2l/m At bedtime  And with act  Follow med calendar closely and bring to each visit.  Please contact office for sooner follow up if symptoms do not improve or worsen or seek emergency care   Follow up Dr. [name]  In 3 months and As needed.

## 2013-12-09 NOTE — Patient Instructions (Signed)
Follow med calendar closely and bring to each visit.  Please contact office for sooner follow up if symptoms do not improve or worsen or seek emergency care   Follow up Dr. Wert  In 3 months and As needed.  

## 2013-12-09 NOTE — Assessment & Plan Note (Signed)
Compensated on present regimen  Patient's medications were reviewed today and patient education was given. Computerized medication calendar was adjusted/completed   Plan  Follow med calendar closely and bring to each visit.  Please contact office for sooner follow up if symptoms do not improve or worsen or seek emergency care   Follow up Dr. [name]  In 3 months and As needed.

## 2013-12-11 NOTE — Progress Notes (Signed)
Patient ID: [name], male   DOB: 05-Jul-1929, 80 y.o.   MRN: [id number]    [address], [address], [place]  [address] Phone: [phone] Fax:  [phone]  Date:  12/11/2013   ID:  [name], DOB Mar 02, 1929, MRN [id number]  PCP:   Melinda Crutch, MD   ASSESSMENT:  1. CAD with prior coronary bypass grafting 2. COPD with hypoxia 3. Hyperlipidemia 4. Essential hypertension  5. Carotid obstructive disease   PLAN:  1. Wean metoprolol to 12.5 mg per day 2. Aerobic activity as tolerated 3. Clinical follow-up in one year 4. Wear oxygen continuously   SUBJECTIVE: [name] is a 80 y.o. male is doing relatively well. He complains of increasing lower extremity swelling. He is unable use diuretic therapy because of orthostasis. He wears oxygen intermittently. He denies angina. He has occasional palpitations. He has a history of PACs.   Wt Readings from Last 3 Encounters:  12/09/13 153 lb (69.4 kg)  10/23/13 141 lb 9.6 oz (64.229 kg)  08/27/13 147 lb 9.6 oz (66.951 kg)     Past Medical History  Diagnosis Date  . Hypertension   . S/P CABG x 4   . CAD (coronary artery disease)   . COPD (chronic obstructive pulmonary disease)   . Hyperlipidemia   . Chronic rhinitis   . Chronic respiratory failure   . Peripheral vascular disease   . Upper respiratory infection 08/2012  . Asthma   . Syncope and collapse   . Carotid artery occlusion     Current Outpatient Prescriptions  Medication Sig Dispense Refill  . aspirin 325 MG tablet Take 1 tablet (325 mg total) by mouth every morning.    Marland Kitchen atorvastatin (LIPITOR) 20 MG tablet TAKE 1 TABLET BY MOUTH DAILY 30 tablet 0  . azithromycin (ZITHROMAX) 250 MG tablet Take 2 on day one then 1 daily x 4 days 6 tablet 11  . budesonide-formoterol (SYMBICORT) 160-4.5 MCG/ACT inhaler Take 2 puffs first thing in am and then another 2 puffs about 12 hours later. 1 Inhaler 11  . Cholecalciferol (VITAMIN D3) 1000 UNITS CAPS Take 1 capsule  (1,000 Units total) by mouth daily.    . finasteride (PROSCAR) 5 MG tablet Take 5 mg by mouth every morning.    . furosemide (LASIX) 20 MG tablet Take 20 mg by mouth daily as needed for fluid.    Marland Kitchen guaiFENesin (MUCINEX) 600 MG 12 hr tablet 1-2 every 12 hours as needed for chest congestion    . levalbuterol (XOPENEX HFA) 45 MCG/ACT inhaler INHLAE 2 PUFFS INTO THE LUNGS EVERY 4 HOURS AS NEEDED FOR WHEEZING AND SHORTNESS OF BREATH (((PLAN A)))    . levalbuterol (XOPENEX) 1.25 MG/3ML nebulizer solution Take 1.25 mg by nebulization every 4 (four) hours as needed for wheezing or shortness of breath (((PLAN B))). Dx 496    . loratadine (CLARITIN) 10 MG tablet Take 10 mg by mouth daily as needed for allergies.    . metoprolol succinate (TOPROL XL) 25 MG 24 hr tablet TAKE 1 TABLET BY MOUTH EVERY DAY 30 tablet 10  . mometasone-formoterol (DULERA) 200-5 MCG/ACT AERO Inhale 2 puffs into the lungs 2 (two) times daily. 13 g 5  . Multiple Vitamin (MULTIVITAMIN) tablet Take 1 tablet by mouth daily.    Marland Kitchen NASONEX 50 MCG/ACT nasal spray INSTILL 2 SPRAYS IN EACH NOSTRIL DAILY AS NEEDED 17 g 5  . NITROSTAT 0.4 MG SL tablet TAKE 1 TABLET SUBLINGUALLY AS DIRECTED  AS NEEDED FOR CHEST PAIN 25 tablet 3  . predniSONE (DELTASONE) 10 MG tablet Take  2 each am until better then 1 each am 100 tablet 0  . ranitidine (ZANTAC) 300 MG tablet Take 1 tablet by mouth 2 (two) times daily.    . Sodium Chloride-Sodium Bicarb (AYR SALINE NASAL RINSE) 1.57 G PACK May use rinses/gel as needed for nasal congestion    . SPIRIVA RESPIMAT 2.5 MCG/ACT AERS USE 2 PUFFS EVERY DAY 4 g 5  . XOPENEX HFA 45 MCG/ACT inhaler INHALE 2 PUFFS INTO THE LUNGS EVERY 4 HOURS AS NEEDED FOR WHEEZING OR SHORTNESS OF BREATH 15 g 1   No current facility-administered medications for this visit.    Allergies:    Allergies  Allergen Reactions  . Cefdinir     REACTION: diarrhea  . Sulfonamide Derivatives     REACTION: facial/tongue swelling    Social  History:  The patient  reports that he quit smoking about 31 years ago. His smoking use included Cigarettes. He has a 66 pack-year smoking history. He has never used smokeless tobacco. He reports that he drinks alcohol. He reports that he does not use illicit drugs.   ROS:  Please see the history of present illness.   Appetite is been stable. Weight is been stable.   All other systems reviewed and negative.   OBJECTIVE: VS:  There were no vitals taken for this visit. Well nourished, well developed, in no acute distress, appears stated age 52: normal Neck: JVD flat. Carotid bruit absent  Cardiac:  normal S1, S2; RRR; no murmur Lungs:  clear to auscultation bilaterally, no wheezing, rhonchi or rales Abd: soft, nontender, no hepatomegaly Ext: Edema absent. Pulses 2+ Skin: warm and dry Neuro:  CNs 2-12 intact, no focal abnormalities noted  EKG:  Normal sinus rhythm right bundle, right axis deviation, PA-C       Signed, Illene Labrador III, MD 12/11/2013 7:16 PM

## 2013-12-12 ENCOUNTER — Ambulatory Visit (INDEPENDENT_AMBULATORY_CARE_PROVIDER_SITE_OTHER): Payer: Medicare Other | Admitting: Interventional Cardiology

## 2013-12-12 ENCOUNTER — Ambulatory Visit: Payer: Medicare Other | Admitting: Interventional Cardiology

## 2013-12-12 ENCOUNTER — Encounter: Payer: Self-pay | Admitting: Interventional Cardiology

## 2013-12-12 DIAGNOSIS — G4733 Obstructive sleep apnea (adult) (pediatric): Secondary | ICD-10-CM

## 2013-12-12 DIAGNOSIS — I10 Essential (primary) hypertension: Secondary | ICD-10-CM | POA: Diagnosis not present

## 2013-12-12 DIAGNOSIS — E785 Hyperlipidemia, unspecified: Secondary | ICD-10-CM | POA: Diagnosis not present

## 2013-12-12 DIAGNOSIS — J432 Centrilobular emphysema: Secondary | ICD-10-CM

## 2013-12-12 DIAGNOSIS — J9611 Chronic respiratory failure with hypoxia: Secondary | ICD-10-CM

## 2013-12-12 DIAGNOSIS — I25810 Atherosclerosis of coronary artery bypass graft(s) without angina pectoris: Secondary | ICD-10-CM

## 2013-12-12 DIAGNOSIS — I6529 Occlusion and stenosis of unspecified carotid artery: Secondary | ICD-10-CM | POA: Diagnosis not present

## 2013-12-12 MED ORDER — METOPROLOL SUCCINATE ER 25 MG PO TB24: 12.5000 mg | ORAL_TABLET | Freq: Every day | ORAL | Status: DC

## 2013-12-12 NOTE — Patient Instructions (Signed)
Your physician has recommended you make the following change in your medication:  1) DECREASE Metoprolol to 1/2 tablet (12.5mg ) daily  Take all other medications as described  Your physician wants you to follow-up in: 1 year with Dr.Smith You will receive a reminder letter in the mail two months in advance. If you don't receive a letter, please call our office to schedule the follow-up appointment.

## 2013-12-16 ENCOUNTER — Telehealth: Payer: Self-pay | Admitting: Internal Medicine

## 2013-12-16 NOTE — Telephone Encounter (Signed)
Dr [name], I do not see where you have prescribed lasix for him before, but it is on current MAR  Please advise if okay to refill, thanks!

## 2013-12-16 NOTE — Telephone Encounter (Signed)
Ok

## 2013-12-16 NOTE — Telephone Encounter (Signed)
ATC PT line rang numerous times but no answer and no VM WCB

## 2013-12-17 MED ORDER — FUROSEMIDE 20 MG PO TABS: 20.0000 mg | ORAL_TABLET | Freq: Every day | ORAL | Status: DC | PRN

## 2013-12-17 NOTE — Telephone Encounter (Signed)
Pt is aware of refill sent to pharmacy that was [facility] by MW. Nothing more needed at this time.

## 2013-12-23 DIAGNOSIS — I4949 Other premature depolarization: Secondary | ICD-10-CM | POA: Diagnosis not present

## 2013-12-23 DIAGNOSIS — I2781 Cor pulmonale (chronic): Secondary | ICD-10-CM | POA: Diagnosis not present

## 2013-12-23 DIAGNOSIS — I119 Hypertensive heart disease without heart failure: Secondary | ICD-10-CM | POA: Diagnosis not present

## 2013-12-23 DIAGNOSIS — R0600 Dyspnea, unspecified: Secondary | ICD-10-CM | POA: Diagnosis not present

## 2013-12-23 DIAGNOSIS — E784 Other hyperlipidemia: Secondary | ICD-10-CM | POA: Diagnosis not present

## 2013-12-23 DIAGNOSIS — I872 Venous insufficiency (chronic) (peripheral): Secondary | ICD-10-CM | POA: Diagnosis not present

## 2013-12-23 DIAGNOSIS — R5381 Other malaise: Secondary | ICD-10-CM | POA: Diagnosis not present

## 2013-12-23 DIAGNOSIS — I2510 Atherosclerotic heart disease of native coronary artery without angina pectoris: Secondary | ICD-10-CM | POA: Diagnosis not present

## 2013-12-29 DIAGNOSIS — R0609 Other forms of dyspnea: Secondary | ICD-10-CM | POA: Diagnosis not present

## 2013-12-30 ENCOUNTER — Other Ambulatory Visit: Payer: Self-pay | Admitting: Interventional Cardiology

## 2013-12-30 NOTE — Addendum Note (Signed)
Addended by: Parke Poisson E on: 12/30/2013 01:19 PM   Modules accepted: Orders, Medications

## 2014-01-15 ENCOUNTER — Other Ambulatory Visit: Payer: Self-pay | Admitting: Internal Medicine

## 2014-01-20 ENCOUNTER — Telehealth: Payer: Self-pay | Admitting: Internal Medicine

## 2014-01-20 NOTE — Telephone Encounter (Signed)
Prefer to use proair but if he declines then do the former but let him know his insurance may still not pay for it and he can always pay cash

## 2014-01-20 NOTE — Telephone Encounter (Signed)
Called and spoke to pt. Informed pt of the recs per MW. Pt verbalized understanding and stated he will pay out of pocket for the xopenex for continuity of therapy. Pt stated he will call back when he is needing a refill. Nothing further needed at this time.

## 2014-01-20 NOTE — Telephone Encounter (Signed)
Called [facility] [phone] to initiate PA Patient ID [id number] Spoke with Amy- Medical is not covered under patient insurance plan. There are 2 different things to do: (1) We will need to do a formulary exception: fax a letter if medical necessity to FAX# [phone] with all patient information(DOB, address, phone number) and MD information (NPI, phone number, fax number, address) included in letter stating that Xopenex is necessity and why pt cannot use Albuterol base product(Proair). They will then either approve medication once letter received or will fax back a form for exception and ask a list of questions for coverage.  (2) OR the medication can be changed to Proair hfa (has to be Proair specifically for coverage)   Please advise Dr [name] which you would like to do for patient. Thanks. (please reply to triage)

## 2014-02-18 DIAGNOSIS — F419 Anxiety disorder, unspecified: Secondary | ICD-10-CM | POA: Diagnosis not present

## 2014-02-18 DIAGNOSIS — R531 Weakness: Secondary | ICD-10-CM | POA: Diagnosis not present

## 2014-02-18 DIAGNOSIS — R109 Unspecified abdominal pain: Secondary | ICD-10-CM | POA: Diagnosis not present

## 2014-02-18 DIAGNOSIS — R0602 Shortness of breath: Secondary | ICD-10-CM | POA: Diagnosis not present

## 2014-02-18 DIAGNOSIS — I10 Essential (primary) hypertension: Secondary | ICD-10-CM | POA: Diagnosis not present

## 2014-02-18 DIAGNOSIS — Z0001 Encounter for general adult medical examination with abnormal findings: Secondary | ICD-10-CM | POA: Diagnosis not present

## 2014-02-18 DIAGNOSIS — E782 Mixed hyperlipidemia: Secondary | ICD-10-CM | POA: Diagnosis not present

## 2014-02-18 DIAGNOSIS — E559 Vitamin D deficiency, unspecified: Secondary | ICD-10-CM | POA: Diagnosis not present

## 2014-02-19 ENCOUNTER — Other Ambulatory Visit (HOSPITAL_COMMUNITY): Payer: Self-pay | Admitting: Family Medicine

## 2014-02-19 DIAGNOSIS — R109 Unspecified abdominal pain: Secondary | ICD-10-CM

## 2014-02-19 DIAGNOSIS — E559 Vitamin D deficiency, unspecified: Secondary | ICD-10-CM

## 2014-02-26 ENCOUNTER — Ambulatory Visit (INDEPENDENT_AMBULATORY_CARE_PROVIDER_SITE_OTHER)
Admission: RE | Admit: 2014-02-26 | Discharge: 2014-02-26 | Disposition: A | Discharge status: Home / Self Care | Payer: Medicare Other | Source: Ambulatory Visit | Attending: Internal Medicine | Admitting: Internal Medicine

## 2014-02-26 ENCOUNTER — Ambulatory Visit (INDEPENDENT_AMBULATORY_CARE_PROVIDER_SITE_OTHER): Payer: Medicare Other | Admitting: Internal Medicine

## 2014-02-26 ENCOUNTER — Encounter: Payer: Self-pay | Admitting: Internal Medicine

## 2014-02-26 VITALS — BP 172/82 | HR 91 | Ht 69.0 in | Wt 153.0 lb

## 2014-02-26 DIAGNOSIS — J449 Chronic obstructive pulmonary disease, unspecified: Secondary | ICD-10-CM

## 2014-02-26 DIAGNOSIS — J9611 Chronic respiratory failure with hypoxia: Secondary | ICD-10-CM | POA: Diagnosis not present

## 2014-02-26 NOTE — Progress Notes (Signed)
Subjective:    Patient ID: [name], male    DOB: 11/23/1929   MRN: [id number]     Summary: Pulmonary/ f/u ov try astepro for pnds   Primary Provider/Referring Provider: [name]    Brief patient profile:  71   yowm last smoked 1983 with GOLD III COPD with some reversibility and 02 dep at hs and with activity    History of Present Illness   05/01/2011 f/u ov/[name] cc 50 ft outside, one aisle at [facility] whole store with 02. No cough and minimal need for saba Seen at [facility] rec change ipatropium neb  to atrovent > no change doe. rec ry tudorza one puff twice daily and stop atrovent and see how it effects your activity performance, if not benefit ok to change back to atrovent   10/04/2011 f/u ov/[name] did not bring med calendar cc  Loosing ground on tudorza so went to atrovent neb and seemed to help. rec Plan A = use automatically no matter what Symbicort followed by Atrovent each am and symbiocort again in 12 hours plus continue the atrovent though the day in between about every 4-6 hours Only use your albuterol (xopenex hfa -puffer-  is Plan B,  Nebulizer is plan C) .      12/18/2012 f/u ov/[name] re: copd/ 02  at bedtime and with exertion  Chief Complaint  Patient presents with  . Follow-up    Increased sob at all times x1 month.  using O2 more frequently   actually no sob at rest or at hs, wears 02 with activity and when he does wear it the sats are fine even when he has to stop due to sob rec Plan A= automatic  symbiocort and tudorza twice daily and ok to use your 02 more consistently with activity that you know makes you short of breath Plan B = backup > xopenex 2 every 4 if needed if can't catch your breath Plan C = xopenex neb every 4 hours if plan B not working, always try plan B first  Please see patient coordinator before you leave today  to schedule rehab  02/14/2013 f/u ov/[name] re:  Chief Complaint  Patient presents with  . Follow-up    Wants to make sure his medications  are correct.  Pt c/o increased SOB with exertion.  easily confused with details of care, only using hfa xopenex not the neb xopenex, times right before he goes to rehab and feels he's making slow progress.  >>changed Symbicort to [facility] and atrovent neb to spiriva respimat     04/25/2013 f/u ov/[name] re:  Chief Complaint  Patient presents with  . Acute Visit    Pt c/o increased DOE for the past 3 wks- worse for the past 4-5 days. He states that he is SOB with any exertion at all such as just raising up his arms. He also c/o nasal congestion for the past 3 months. He is using xopenex HFA at least twice per day.    incxrease doe assoc with prod of more mucus and nasal congestion  but better overnight and worse as day goes on, occ green mucus but mostly white No desats walking on 02 at rehab 2lpm rec Prednisone 10 mg take  4 each am x 2 days,   2 each am x 2 days,  1 each am x 2 days and stop  Follow med calendar    06/19/2013 f/u ov/[name] re: GOLD III COPD with some reversibility on dulera 200 2bid  and spriva respimat  Chief Complaint  Patient presents with  . Follow-up    Pt c/o increased DOE for the past month. He states he gets SOB with walking approx 50 ft.    using xopenex qid never neb Did get def transient  benefit from prednisone  >steroid challenge   07/17/2013 Follow up and Med review  Returns for follow up and med review . We reviewed  All his meds and organized them into a med calendar w/ pt education.  Last visit was started on prednisone 20 mg with taper down to 10 mg and hold. Patient says that his breathing has improved. We discussed steroid use, and he wishes to taper off of this due to potential side effects.  rec Follow med calendar closely and bring to each visit.  Decrease prednisone 10mg  1/2 daily for 1 week then 1/2 every other day for 1 week and then stop.  May use saline nasal spray and gel As needed  For congestion .    08/27/2013 f/u ov/[name] re: copd/ 02 dep  with sleep/ ex / on symbicort and spiriva  Chief Complaint  Patient presents with  . Follow-up    ROV : Pt c/o incr SOB and states that he has had a recent decrease in activity--does not feel like his stamina/endurance is where it needs to be.   Variable doe  seemed some better on prednisone - never sob at rest or sleeping  Wears 02 when sleeps at 2 and exercises at 2lpm on treadmill at 1.2 mph level, goes 5 min rest 1 min Has not tried pre treating with xopenex before he does the treadmill, never tried slow setting, never uses neb xopenex rec Try using your xopenex hfa  before planned walk on your treadmill to see what difference if any this makes Prevnar 13 given today Resume prednisone 10 mg x 2 daily when xopenx is not lasting 4 hours, then 1 daily x 5 days and stop      10/23/2013 f/u ov/[name] re: copd/02 dep on symb/spiriva/ xopenex prn plus back up pred short course Chief Complaint  Patient presents with  . Follow-up    SOB   feels prednisone helped a lot with sob and less need for saba Able do treadmill x 15 min x 1 mph no grade no  wheeze overt sinus or hb symptoms.   rec Try using your xopenex hfa  before planned walk on your treadmill to see what difference if any this makes prevnar 13 given today Resume prednisone 10 mg x 2 daily when xopenx is not lasting 4 hours, then 1 daily x 5 days and stop   02/26/2014 f/u ov/[name] re: GOLD III/ confused with action plan portion of med calendar  Chief Complaint  Patient presents with  . Follow-up    Pt c/o increased cough and SOB for the past 3 wks. His cough is worse in the am and is prod with white to light green sputum. He is using xopenex inhaler about 3-4 x per day and has not used neb.    treadmill x 15 min s stopping  x 1.2 flat/ on 02 2lpm with sats no lower than 93   Cough is day > noct, not flaring in am, no purulent sputum  All symptoms improve on prednisone, he just doesn't understand to concept of using it as a  prn  No obvious day to day or daytime variabilty or assoc  cp or chest tightness, subjective wheeze overt  sinus or hb symptoms. No unusual exp hx or h/o childhood pna/ asthma or knowledge of premature birth.  Sleeping ok without nocturnal  or early am exacerbation  of respiratory  c/o's or need for noct saba. Also denies any obvious fluctuation of symptoms with weather or environmental changes or other aggravating or alleviating factors except as outlined above   Current Medications, Allergies, Complete Past Medical History, Past Surgical History, Family History, and Social History were reviewed in [employer] record.  ROS  The following are not active complaints unless bolded sore throat, dysphagia, dental problems, itching, sneezing,  nasal congestion or excess/ purulent secretions, ear ache,   fever, chills, sweats, unintended wt loss, pleuritic or exertional cp, hemoptysis,  orthopnea pnd or leg swelling, presyncope, palpitations, heartburn, abdominal pain, anorexia, nausea, vomiting, diarrhea  or change in bowel or urinary habits, change in stools or urine, dysuria,hematuria,  rash, arthralgias, visual complaints, headache, numbness weakness or ataxia or problems with walking or coordination,  change in mood/affect or memory.             Past Medical History:  CHRONIC RHINITIS (ICD-472.0)  - Sinus ct March 03, 2010 > thickening only, no acute changes  CHRONIC RESPIRATORY FAILURE (ICD-518.83)  - 02 rx since 2008  COPD UNSPECIFIED (ICD-496)....................................................................[name]  - PFTs 05/04/05 FEV1 36% ratio 28% with 29% improvement after bronchodilators DLCO 48%  - PFT's 03/26/08 30% ratio 34 with 14% improvement after bronchodilators DLC0 38 %  - Start noct 02 at 2lpm 2008 and on 03/04/09 desat @ > 185 ft so rec wear with activtiy > rm to rm  - [facility] June 30, 2008 100% > confirmed March 04, 2009  ISCHEMIC HEART DISEASE (ICD-414.9)   HYPERTENSION (ICD-401.9)  HYPERLIPIDEMIA (ICD-272.4)  SPN RUL by cxr August 19, 2009  COMPLEX MED REGIMEN-Meds reviewed with pt education and computerized med calendar 11/22/09 03/10/2011  , 10/15/2012  07/17/2013 , 12/09/2013           Objective:   Physical Exam  amb wm nad with nl vitals   wt 159 04/28/2010  > 10/14/2010 154  > 10/04/2011 150> 12/21/2011  150 > 149 01/29/2012 >154 04/05/2012 > 05/22/2012 148 > 09/30/2012  146 >>144> 10/15/2012 > 12/18/2012  145 > 148 02/14/2013 >148 03/14/2013 > 04/25/2013 148 > 06/19/2013 150 >>07/17/2013 > 08/27/2013 148 > 10/23/2013  148 >153 12/09/2013 > 02/26/2014  153  HEENT mild to mod nonspecific turbinate edema.  Oropharynx no thrush or excess pnd or cobblestoning.  No JVD or cervical adenopathy. Mild accessory muscle hypertrophy. Trachea midline, nl thryroid. Chest was hyperinflated by percussion with diminished breath sounds and moderate increased exp time without wheeze.   Regular rate and rhythm without murmur gallop or rub or increase P2 or edema.  Abd: no hsm, nl excursion. Ext warm without cyanosis or clubbing.       CXR PA and Lateral:   02/26/2014 :     I personally reviewed images and agree with radiology impression as follows:    COPD. There is no evidence of pneumonia, CHF, nor other active cardiopulmonary disease.    Assessment & Plan:

## 2014-02-26 NOTE — Patient Instructions (Signed)
Please remember to go to the  x-ray department downstairs for your tests - we will call you with the results when they are available.  Make sure you keep sats over 90% with activity    See calendar for specific medication instructions and bring it back for each and every office visit for every healthcare provider you see.  Without it,  you may not receive the best quality medical care that we feel you deserve.  You will note that the calendar groups together  your maintenance  medications that are timed at particular times of the day.  Think of this as your checklist for what your doctor has instructed you to do until your next evaluation to see what benefit  there is  to staying on a consistent group of medications intended to keep you well.  The other group at the bottom is entirely up to you to use as you see fit  for specific symptoms that may arise between visits that require you to treat them on an as needed basis.  Think of this as your action plan or "what if" list.   Separating the top medications from the bottom group is fundamental to providing you adequate care going forward.

## 2014-03-01 ENCOUNTER — Encounter: Payer: Self-pay | Admitting: Internal Medicine

## 2014-03-01 NOTE — Assessment & Plan Note (Signed)
-   PFTs 05/04/05 FEV1 36% ratio 28% with 29% improvement after bronchodilators DLCO 48%  - PFT's 03/26/08 30% ratio 34 with 14% improvement after bronchodilators DLC0 38 %  - Referred to rehab 12/19/2012 > completed March 2015  - changed to spiriva respimat 02/14/2013  - 02/14/2013 p extensive coaching HFA effectiveness =    90%  - started daily prednisone 06/19/13 > improved only a little> tapered off mid July 2015 > ok to change to prn prednisone 08/27/2013   I had an extended discussion with the patient reviewing all relevant studies completed to date and  lasting 15 to 20 minutes of a 25 minute visit on the following ongoing concerns:  See calendar for specific medication instructions and bring it back for each and every office visit for every healthcare provider you see.  Without it,  you may not receive the best quality medical care that we feel you deserve.  You will note that the calendar groups together  your maintenance  medications that are timed at particular times of the day.  Think of this as your checklist for what your doctor has instructed you to do until your next evaluation to see what benefit  there is  to staying on a consistent group of medications intended to keep you well.  The other group at the bottom is entirely up to you to use as you see fit  for specific symptoms that may arise between visits that require you to treat them on an as needed basis.  Think of this as your action plan or "what if" list.   Separating the top medications from the bottom group is fundamental to providing you adequate care going forward.    When need for xopenex goes above baseline, he is to take pred 10 x 2 until better then one daily x 5 days and stop

## 2014-03-12 ENCOUNTER — Other Ambulatory Visit: Payer: Self-pay | Admitting: Adult Health

## 2014-03-19 ENCOUNTER — Other Ambulatory Visit: Payer: Self-pay | Admitting: Internal Medicine

## 2014-03-19 ENCOUNTER — Other Ambulatory Visit: Payer: Self-pay | Admitting: Interventional Cardiology

## 2014-03-23 ENCOUNTER — Telehealth: Payer: Self-pay | Admitting: *Deleted

## 2014-03-23 MED ORDER — ALBUTEROL SULFATE HFA 108 (90 BASE) MCG/ACT IN AERS: 2.0000 | INHALATION_SPRAY | Freq: Four times a day (QID) | RESPIRATORY_TRACT | Status: DC | PRN

## 2014-03-23 NOTE — Telephone Encounter (Signed)
Ok to change to proair  

## 2014-03-23 NOTE — Telephone Encounter (Signed)
Received PA for pt xopenex HFA Called [phone] ID # [id number] Was advised pt must first try and fail proair/ventolin/proventil inhaler. Please advise MW thanks

## 2014-03-23 NOTE — Telephone Encounter (Signed)
Spoke with pt. He does not want his medication switched. He will pay out of pocket for Xopenex.

## 2014-03-30 ENCOUNTER — Other Ambulatory Visit: Payer: Self-pay | Admitting: Interventional Cardiology

## 2014-03-31 ENCOUNTER — Other Ambulatory Visit: Payer: Self-pay

## 2014-03-31 ENCOUNTER — Other Ambulatory Visit (HOSPITAL_COMMUNITY): Payer: Self-pay | Admitting: Family Medicine

## 2014-03-31 DIAGNOSIS — R109 Unspecified abdominal pain: Secondary | ICD-10-CM

## 2014-03-31 MED ORDER — METOPROLOL SUCCINATE ER 25 MG PO TB24: 12.5000 mg | ORAL_TABLET | Freq: Every day | ORAL | Status: DC

## 2014-04-10 ENCOUNTER — Other Ambulatory Visit: Payer: Self-pay | Admitting: Adult Health

## 2014-04-15 DIAGNOSIS — D485 Neoplasm of uncertain behavior of skin: Secondary | ICD-10-CM | POA: Diagnosis not present

## 2014-04-15 DIAGNOSIS — L409 Psoriasis, unspecified: Secondary | ICD-10-CM | POA: Diagnosis not present

## 2014-04-15 DIAGNOSIS — L570 Actinic keratosis: Secondary | ICD-10-CM | POA: Diagnosis not present

## 2014-04-15 DIAGNOSIS — L821 Other seborrheic keratosis: Secondary | ICD-10-CM | POA: Diagnosis not present

## 2014-04-20 ENCOUNTER — Encounter (HOSPITAL_COMMUNITY)
Admission: RE | Admit: 2014-04-20 | Discharge: 2014-04-20 | Disposition: A | Discharge status: Home / Self Care | Payer: Medicare Other | Source: Ambulatory Visit | Attending: Family Medicine | Admitting: Family Medicine

## 2014-04-20 DIAGNOSIS — R109 Unspecified abdominal pain: Secondary | ICD-10-CM | POA: Insufficient documentation

## 2014-04-20 MED ORDER — SINCALIDE 5 MCG IJ SOLR
INTRAMUSCULAR | Status: AC
  Filled 2014-04-20: qty 5

## 2014-04-20 MED ORDER — TECHNETIUM TC 99M MEBROFENIN IV KIT
5.0000 | PACK | Freq: Once | INTRAVENOUS | Status: AC | PRN
  Administered 2014-04-20: 5 via INTRAVENOUS

## 2014-04-20 MED ORDER — SINCALIDE 5 MCG IJ SOLR
0.0200 ug/kg | Freq: Once | INTRAMUSCULAR | Status: AC
  Administered 2014-04-20: 1.39 ug via INTRAVENOUS

## 2014-04-24 ENCOUNTER — Other Ambulatory Visit: Payer: Self-pay | Admitting: Gastroenterology

## 2014-04-24 ENCOUNTER — Telehealth: Payer: Self-pay | Admitting: *Deleted

## 2014-04-24 DIAGNOSIS — R1011 Right upper quadrant pain: Secondary | ICD-10-CM

## 2014-04-24 DIAGNOSIS — K5901 Slow transit constipation: Secondary | ICD-10-CM | POA: Diagnosis not present

## 2014-04-24 NOTE — Telephone Encounter (Signed)
Patient called and stated that he and Dr Tamala Julian had discussed at his last office visit that he would take the metoprolol 12.5mg  qd short term only and then go back to 25mg  qd dose. I do not see any documentation of this. He is now back on the 25mg  qd dose and would like an rx for this dose. Please advise. Thanks, MI

## 2014-04-24 NOTE — Telephone Encounter (Signed)
Please refill metoprolol succ 25 mg one daily as he is currently taking. Update med list.

## 2014-04-27 ENCOUNTER — Other Ambulatory Visit: Payer: Self-pay | Admitting: *Deleted

## 2014-04-27 MED ORDER — METOPROLOL SUCCINATE ER 25 MG PO TB24: 25.0000 mg | ORAL_TABLET | Freq: Every day | ORAL | Status: DC

## 2014-04-27 NOTE — Telephone Encounter (Signed)
Rx sent in. Med list updated. 

## 2014-04-28 ENCOUNTER — Other Ambulatory Visit: Payer: Self-pay | Admitting: Internal Medicine

## 2014-04-28 ENCOUNTER — Telehealth: Payer: Self-pay | Admitting: Internal Medicine

## 2014-04-28 ENCOUNTER — Ambulatory Visit
Admission: RE | Admit: 2014-04-28 | Discharge: 2014-04-28 | Disposition: A | Discharge status: Home / Self Care | Payer: Medicare Other | Source: Ambulatory Visit | Attending: Gastroenterology | Admitting: Gastroenterology

## 2014-04-28 DIAGNOSIS — R1011 Right upper quadrant pain: Secondary | ICD-10-CM

## 2014-04-28 DIAGNOSIS — K219 Gastro-esophageal reflux disease without esophagitis: Secondary | ICD-10-CM | POA: Diagnosis not present

## 2014-04-28 DIAGNOSIS — N200 Calculus of kidney: Secondary | ICD-10-CM | POA: Diagnosis not present

## 2014-04-28 MED ORDER — MOMETASONE FUROATE 50 MCG/ACT NA SUSP: 2.0000 | Freq: Every day | NASAL | Status: DC

## 2014-04-28 NOTE — Telephone Encounter (Signed)
Spoke with the pt  He needs refill on nasonex  Rx was sent and nothing further needed

## 2014-04-30 DIAGNOSIS — H04123 Dry eye syndrome of bilateral lacrimal glands: Secondary | ICD-10-CM | POA: Diagnosis not present

## 2014-04-30 DIAGNOSIS — H26493 Other secondary cataract, bilateral: Secondary | ICD-10-CM | POA: Diagnosis not present

## 2014-04-30 DIAGNOSIS — H1859 Other hereditary corneal dystrophies: Secondary | ICD-10-CM | POA: Diagnosis not present

## 2014-04-30 DIAGNOSIS — H10413 Chronic giant papillary conjunctivitis, bilateral: Secondary | ICD-10-CM | POA: Diagnosis not present

## 2014-04-30 DIAGNOSIS — Z961 Presence of intraocular lens: Secondary | ICD-10-CM | POA: Diagnosis not present

## 2014-05-06 DIAGNOSIS — R972 Elevated prostate specific antigen [PSA]: Secondary | ICD-10-CM | POA: Diagnosis not present

## 2014-05-11 DIAGNOSIS — R1011 Right upper quadrant pain: Secondary | ICD-10-CM | POA: Diagnosis not present

## 2014-05-13 DIAGNOSIS — N401 Enlarged prostate with lower urinary tract symptoms: Secondary | ICD-10-CM | POA: Diagnosis not present

## 2014-05-13 DIAGNOSIS — R3915 Urgency of urination: Secondary | ICD-10-CM | POA: Diagnosis not present

## 2014-05-18 ENCOUNTER — Other Ambulatory Visit: Payer: Self-pay | Admitting: Internal Medicine

## 2014-05-20 ENCOUNTER — Other Ambulatory Visit (HOSPITAL_COMMUNITY): Payer: Medicare Other

## 2014-05-20 ENCOUNTER — Ambulatory Visit: Payer: Medicare Other | Admitting: Family

## 2014-05-20 ENCOUNTER — Telehealth: Payer: Self-pay

## 2014-05-20 DIAGNOSIS — R209 Unspecified disturbances of skin sensation: Secondary | ICD-10-CM

## 2014-05-20 DIAGNOSIS — R200 Anesthesia of skin: Secondary | ICD-10-CM

## 2014-05-20 DIAGNOSIS — I70219 Atherosclerosis of native arteries of extremities with intermittent claudication, unspecified extremity: Secondary | ICD-10-CM

## 2014-05-20 NOTE — Telephone Encounter (Signed)
RE: need recommendation  Received: Today    Sharmon Leyden Nickel, NP  Denman George, RN  Cc: Sharmon Leyden Nickel, NP           Arbie Cookey,  Yes, it appears that ABI's are warranted.  Indication is intermittent claudication in lower extremities.  Thank you,  Vinnie Level

## 2014-05-20 NOTE — Telephone Encounter (Addendum)
Phone call from pt.  Is requesting to have his legs evaluated at the appt. in June, when he follows up for his Carotid Artery Disease surveillance.  Reported bilateral cramping in calves with walking on the treadmill.  Stated this eases up with rest.  Reported "a little numbness in legs and feet that has slowly progressed over last year."   Reported intermittent cold sensation in feet.  Denies open sores of lower extremities or feet.  Denies rest pain.  Will discuss with nurse practitioner and notify pt. of plan.  Agrees.

## 2014-06-01 ENCOUNTER — Ambulatory Visit (INDEPENDENT_AMBULATORY_CARE_PROVIDER_SITE_OTHER): Payer: Medicare Other | Admitting: Internal Medicine

## 2014-06-01 ENCOUNTER — Encounter: Payer: Self-pay | Admitting: Internal Medicine

## 2014-06-01 VITALS — BP 140/70 | HR 60 | Ht 69.0 in | Wt 151.8 lb

## 2014-06-01 DIAGNOSIS — J9611 Chronic respiratory failure with hypoxia: Secondary | ICD-10-CM | POA: Diagnosis not present

## 2014-06-01 DIAGNOSIS — J449 Chronic obstructive pulmonary disease, unspecified: Secondary | ICD-10-CM | POA: Diagnosis not present

## 2014-06-01 DIAGNOSIS — J310 Chronic rhinitis: Secondary | ICD-10-CM | POA: Diagnosis not present

## 2014-06-01 DIAGNOSIS — I70219 Atherosclerosis of native arteries of extremities with intermittent claudication, unspecified extremity: Secondary | ICD-10-CM | POA: Diagnosis not present

## 2014-06-01 NOTE — Patient Instructions (Addendum)
Omeprazole should be Take 30- 60 min before your first and last meals of the day   Instead of clariton > try For drainage take chlortrimeton (chlorpheniramine) 4 mg every 4 hours available over the counter (may cause drowsiness)   Try toprol 25 mg one half daily with furosemide 20 mg daily until you return   See Tammy NP  2 weeks with all your medications, even over the counter meds, separated in two separate bags, the ones you take no matter what vs the ones you stop once you feel better and take only as needed when you feel you need them.   Tammy  will generate for you a new user friendly medication calendar that will put [place] all on the same page re: your medication use.     Without this process, it simply isn't possible to assure that we are providing  your outpatient care  with  the attention to detail we feel you deserve.   If we cannot assure that you're getting that kind of care,  then we cannot manage your problem effectively from this clinic.  Once you have seen Tammy and we are sure that we're all on the same page with your medication use she will arrange follow up with me.

## 2014-06-01 NOTE — Progress Notes (Signed)
Subjective:    Patient ID: [name], male    DOB: 1930/01/02   MRN: [id number]     Summary: Pulmonary/ f/u ov try astepro for pnds   Primary Provider/Referring Provider: [name]    Brief patient profile:  68   yowm last smoked 1983 with GOLD III COPD with some reversibility and 02 dep at hs and with activity    History of Present Illness   05/01/2011 f/u ov/[name] cc 50 ft outside, one aisle at [facility], whole store with 02. No cough and minimal need for saba Seen at [facility] rec change ipatropium neb  to atrovent > no change doe. rec ry tudorza one puff twice daily and stop atrovent and see how it effects your activity performance, if not benefit ok to change back to atrovent   10/04/2011 f/u ov/[name] did not bring med calendar cc  Loosing ground on tudorza so went to atrovent neb and seemed to help. rec Plan A = use automatically no matter what Symbicort followed by Atrovent each am and symbiocort again in 12 hours plus continue the atrovent though the day in between about every 4-6 hours Only use your albuterol (xopenex hfa -puffer-  is Plan B,  Nebulizer is plan C) .      12/18/2012 f/u ov/[name] re: copd/ 02  at bedtime and with exertion  Chief Complaint  Patient presents with  . Follow-up    Increased sob at all times x1 month.  using O2 more frequently   actually no sob at rest or at hs, wears 02 with activity and when he does wear it the sats are fine even when he has to stop due to sob rec Plan A= automatic  symbiocort and tudorza twice daily and ok to use your 02 more consistently with activity that you know makes you short of breath Plan B = backup > xopenex 2 every 4 if needed if can't catch your breath Plan C = xopenex neb every 4 hours if plan B not working, always try plan B first  Please see patient coordinator before you leave today  to schedule rehab  02/14/2013 f/u ov/[name] re:  Chief Complaint  Patient presents with  . Follow-up    Wants to make sure his medications  are correct.  Pt c/o increased SOB with exertion.  easily confused with details of care, only using hfa xopenex not the neb xopenex, times right before he goes to rehab and feels he's making slow progress.  >>changed Symbicort to [facility] and atrovent neb to spiriva respimat     04/25/2013 f/u ov/[name] re:  Chief Complaint  Patient presents with  . Acute Visit    Pt c/o increased DOE for the past 3 wks- worse for the past 4-5 days. He states that he is SOB with any exertion at all such as just raising up his arms. He also c/o nasal congestion for the past 3 months. He is using xopenex HFA at least twice per day.    incxrease doe assoc with prod of more mucus and nasal congestion  but better overnight and worse as day goes on, occ green mucus but mostly white No desats walking on 02 at rehab 2lpm rec Prednisone 10 mg take  4 each am x 2 days,   2 each am x 2 days,  1 each am x 2 days and stop  Follow med calendar    06/19/2013 f/u ov/[name] re: GOLD III COPD with some reversibility on dulera 200 2bid  and spriva respimat  Chief Complaint  Patient presents with  . Follow-up    Pt c/o increased DOE for the past month. He states he gets SOB with walking approx 50 ft.    using xopenex qid never neb Did get def transient  benefit from prednisone  >steroid challenge   07/17/2013 Follow up and Med review  Returns for follow up and med review . We reviewed  All his meds and organized them into a med calendar w/ pt education.  Last visit was started on prednisone 20 mg with taper down to 10 mg and hold. Patient says that his breathing has improved. We discussed steroid use, and he wishes to taper off of this due to potential side effects.  rec Follow med calendar closely and bring to each visit.  Decrease prednisone 10mg  1/2 daily for 1 week then 1/2 every other day for 1 week and then stop.  May use saline nasal spray and gel As needed  For congestion .    08/27/2013 f/u ov/[name] re: copd/ 02 dep  with sleep/ ex / on symbicort and spiriva  Chief Complaint  Patient presents with  . Follow-up    ROV : Pt c/o incr SOB and states that he has had a recent decrease in activity--does not feel like his stamina/endurance is where it needs to be.   Variable doe  seemed some better on prednisone - never sob at rest or sleeping  Wears 02 when sleeps at 2 and exercises at 2lpm on treadmill at 1.2 mph level, goes 5 min rest 1 min Has not tried pre treating with xopenex before he does the treadmill, never tried slow setting, never uses neb xopenex rec Try using your xopenex hfa  before planned walk on your treadmill to see what difference if any this makes Prevnar 13 given today Resume prednisone 10 mg x 2 daily when xopenx is not lasting 4 hours, then 1 daily x 5 days and stop      10/23/2013 f/u ov/[name] re: copd/02 dep on symb/spiriva/ xopenex prn plus back up pred short course Chief Complaint  Patient presents with  . Follow-up    SOB   feels prednisone helped a lot with sob and less need for saba Able do treadmill x 15 min x 1 mph no grade no  wheeze overt sinus or hb symptoms.   rec Try using your xopenex hfa  before planned walk on your treadmill to see what difference if any this makes prevnar 13 given today Resume prednisone 10 mg x 2 daily when xopenx is not lasting 4 hours, then 1 daily x 5 days and stop   02/26/2014 f/u ov/[name] re: GOLD III/ confused with action plan portion of med calendar  Chief Complaint  Patient presents with  . Follow-up    Pt c/o increased cough and SOB for the past 3 wks. His cough is worse in the am and is prod with white to light green sputum. He is using xopenex inhaler about 3-4 x per day and has not used neb.    treadmill x 15 min s stopping  x 1.2 mph flat/ on 02 2lpm with sats no lower than 93  Cough is day > noct, not flaring in am, no purulent sputum rec Goal is to keep sats over 90% with activity     06/01/2014 f/u ov/[name] re: COPD GOLD III/  symb/ spiriva respimat prednsione ? Dose / did not bring med calendar Chief Complaint  Patient  presents with  . Follow-up    Pt states his breathing is progressively worse since the last visit.    increased to 18 m on 02  1.2 mph, no tilt s stopping p 10 min to rest on 2lpm with adequate sats reported    some days  Prednisone 10 mg tapered but confused with instructions Drippy nose no better with flonase / clariton > has not tried 1st gen H1  Not much change using saba before ex   No obvious patterns in day to day or daytime variabilty or assoc excess/ purulent sputum or  cp or chest tightness, subjective wheeze overt  hb symptoms. No unusual exp hx or h/o childhood pna/ asthma or knowledge of premature birth.  Sleeping ok without nocturnal  or early am exacerbation  of respiratory  c/o's or need for noct saba. Also denies any obvious fluctuation of symptoms with weather or environmental changes or other aggravating or alleviating factors except as outlined above   Current Medications, Allergies, Complete Past Medical History, Past Surgical History, Family History, and Social History were reviewed in [employer] record.  ROS  The following are not active complaints unless bolded sore throat, dysphagia, dental problems, itching, sneezing,  nasal congestion or excess/ purulent secretions, ear ache,   fever, chills, sweats, unintended wt loss, pleuritic or exertional cp, hemoptysis,  orthopnea pnd or leg swelling, presyncope, palpitations, heartburn, abdominal pain, anorexia, nausea, vomiting, diarrhea  or change in bowel or urinary habits, change in stools or urine, dysuria,hematuria,  rash, arthralgias, visual complaints, headache, numbness weakness or ataxia or problems with walking or coordination,  change in mood/affect or memory.             Past Medical History:  CHRONIC RHINITIS (ICD-472.0)  - Sinus ct March 03, 2010 > thickening only, no acute changes  CHRONIC  RESPIRATORY FAILURE (ICD-518.83)  - 02 rx since 2008  COPD UNSPECIFIED (ICD-496)....................................................................[name]  - PFTs 05/04/05 FEV1 36% ratio 28% with 29% improvement after bronchodilators DLCO 48%  - PFT's 03/26/08 30% ratio 34 with 14% improvement after bronchodilators DLC0 38 %  - Start noct 02 at 2lpm 2008 and on 03/04/09 desat @ > 185 ft so rec wear with activtiy > rm to rm  - [facility] June 30, 2008 100% > confirmed March 04, 2009  ISCHEMIC HEART DISEASE (ICD-414.9)  HYPERTENSION (ICD-401.9)  HYPERLIPIDEMIA (ICD-272.4)  SPN RUL by cxr August 19, 2009  COMPLEX MED REGIMEN-Meds reviewed with pt education and computerized med calendar 11/22/09 03/10/2011  , 10/15/2012  07/17/2013 , 12/09/2013           Objective:   Physical Exam  amb wm nad with nl vitals   wt 159 04/28/2010  > 10/14/2010 154  > 10/04/2011 150> 12/21/2011  150 > 149 01/29/2012 >154 04/05/2012 > 05/22/2012 148 > 09/30/2012  146 >>144> 10/15/2012 > 12/18/2012  145 > 148 02/14/2013 >148 03/14/2013 > 04/25/2013 148 > 06/19/2013 150 >>07/17/2013 > 08/27/2013 148 > 10/23/2013  148 >153 12/09/2013 > 02/26/2014  153 > 06/01/2014  152   HEENT mild to mod nonspecific turbinate edema.  Oropharynx no thrush or excess pnd or cobblestoning.  No JVD or cervical adenopathy. Mild accessory muscle hypertrophy. Trachea midline, nl thryroid. Chest was hyperinflated by percussion with diminished breath sounds and moderate increased exp time without wheeze.   Regular rate and rhythm without murmur gallop or rub or increase P2 - 1-2 Plus pitting bilateral lower ext edema.   Abd: no hsm, nl  excursion. Ext warm without cyanosis or clubbing.       CXR PA and Lateral:   02/26/2014 :     I personally reviewed images and agree with radiology impression as follows:    COPD. There is no evidence of pneumonia, CHF, nor other active cardiopulmonary disease.    Assessment & Plan:

## 2014-06-02 ENCOUNTER — Encounter: Payer: Self-pay | Admitting: Internal Medicine

## 2014-06-02 NOTE — Assessment & Plan Note (Signed)
-   PFTs 05/04/05 FEV1 36% ratio 28% with 29% improvement after bronchodilators DLCO 48%  - PFT's 03/26/08 30% ratio 34 with 14% improvement after bronchodilators DLC0 38 %  - Referred to rehab 12/19/2012 > completed March 2015  - changed to spiriva respimat 02/14/2013  - 02/14/2013 p extensive coaching HFA effectiveness =    90%  - started daily prednisone 06/19/13 > improved only a little> tapered off mid July 2015 > ok to change to prn prednisone 08/27/2013   I had an extended discussion with the patient reviewing all relevant studies completed to date and  lasting 15 to 20 minutes of a 25 minute visit on the following ongoing concerns:   1) confused with action plan portion of med calendar and not keeping up with revisions made at last ov - I reminded him we make these in different colors so that the changes"pop" out of the calendar and he needs to keep looking at it to take advantage of the most recent recs  2) for now plan is to stop prednisone after taper, not maintain it  3) ? Developing cor pulmonale > encouraged to use lasix as per med calendar  4)   Each maintenance medication was reviewed in detail including most importantly the difference between maintenance and as needed and under what circumstances the prns are to be used. This was done in the context of a medication calendar review which provided the patient with a user-friendly unambiguous mechanism for medication administration and reconciliation and provides an action plan for all active problems. It is critical that this be shown to every doctor  for modification during the office visit if necessary so the patient can use it as a working document.

## 2014-06-02 NOTE — Assessment & Plan Note (Signed)
-   Start noct 02 at 2lpm 2008 and on 03/04/09 desat @ > 185 ft so rec wear with activtiy > rm to rm  - 02/15/2011   Walked RA x one lap @ 185 stopped due to  desat > corrected on 2lpm - 02/26/2014  Walked 2lpm  2 laps @ 185 ft each stopped due to  Sob/ sats 88% at nl pace   Reviewed 02 rx, rec 3lpm with activity   best to monitor with 02 sat monitor and adjust 02  for target > 90% sat if needed

## 2014-06-02 NOTE — Assessment & Plan Note (Signed)
Sinus CT 03/03/10 chronic changes only  - rec rechallenge with qnasal 12/21/11  - Trial of 1st gen h1 and hs h2 rec 05/23/2012  - Allergy profile 12/03/2012-negative-IgE 94.8     Food IgE profile-negative  Note dymista worked the best but can't afford it on his plan so best option is c rechallnge with 1st gen H1 - See instructions for specific recommendations which were reviewed directly with the patient who was given a copy with highlighter outlining the key components.

## 2014-06-09 DIAGNOSIS — K5901 Slow transit constipation: Secondary | ICD-10-CM | POA: Diagnosis not present

## 2014-06-09 DIAGNOSIS — R109 Unspecified abdominal pain: Secondary | ICD-10-CM | POA: Diagnosis not present

## 2014-06-15 ENCOUNTER — Encounter: Payer: Self-pay | Admitting: Family

## 2014-06-15 ENCOUNTER — Encounter: Payer: Self-pay | Admitting: Adult Health

## 2014-06-15 ENCOUNTER — Ambulatory Visit (INDEPENDENT_AMBULATORY_CARE_PROVIDER_SITE_OTHER): Payer: Medicare Other | Admitting: Adult Health

## 2014-06-15 VITALS — BP 130/70 | HR 68 | Ht 69.0 in | Wt 157.0 lb

## 2014-06-15 DIAGNOSIS — J9611 Chronic respiratory failure with hypoxia: Secondary | ICD-10-CM | POA: Diagnosis not present

## 2014-06-15 DIAGNOSIS — J449 Chronic obstructive pulmonary disease, unspecified: Secondary | ICD-10-CM | POA: Diagnosis not present

## 2014-06-15 MED ORDER — TIOTROPIUM BROMIDE MONOHYDRATE 2.5 MCG/ACT IN AERS: 2.0000 | INHALATION_SPRAY | Freq: Every day | RESPIRATORY_TRACT | Status: DC

## 2014-06-15 NOTE — Assessment & Plan Note (Signed)
Cont w/ o2 with act and At bedtime

## 2014-06-15 NOTE — Assessment & Plan Note (Signed)
Recent flare now resolving  Patient's medications were reviewed today and patient education was given. Computerized medication calendar was adjusted/completed   Plan  Follow med calendar closely and bring to each visit.  Please contact office for sooner follow up if symptoms do not improve or worsen or seek emergency care   Follow up Dr. [name]  In 3 months and As needed.

## 2014-06-15 NOTE — Progress Notes (Signed)
Subjective:    Patient ID: [name], male    DOB: [birth year]   MRN: [id number]     Summary: Pulmonary/ f/u ov try astepro for pnds   Primary Provider/Referring Provider: [name]    Brief patient profile:  75   yowm last smoked 1983 with GOLD III COPD with some reversibility and 02 dep at hs and with activity    History of Present Illness   05/01/2011 f/u ov/Wert cc 50 ft outside, one aisle at [facility], whole store with 02. No cough and minimal need for saba Seen at [facility] rec change ipatropium neb  to atrovent > no change doe. rec ry tudorza one puff twice daily and stop atrovent and see how it effects your activity performance, if not benefit ok to change back to atrovent   10/04/2011 f/u ov/Wert did not bring med calendar cc  Loosing ground on tudorza so went to atrovent neb and seemed to help. rec Plan A = use automatically no matter what Symbicort followed by Atrovent each am and symbiocort again in 12 hours plus continue the atrovent though the day in between about every 4-6 hours Only use your albuterol (xopenex hfa -puffer-  is Plan B,  Nebulizer is plan C) .      12/18/2012 f/u ov/Wert re: copd/ 02  at bedtime and with exertion  Chief Complaint  Patient presents with  . Follow-up    Increased sob at all times x1 month.  using O2 more frequently   actually no sob at rest or at hs, wears 02 with activity and when he does wear it the sats are fine even when he has to stop due to sob rec Plan A= automatic  symbiocort and tudorza twice daily and ok to use your 02 more consistently with activity that you know makes you short of breath Plan B = backup > xopenex 2 every 4 if needed if can't catch your breath Plan C = xopenex neb every 4 hours if plan B not working, always try plan B first  Please see patient coordinator before you leave today  to schedule rehab  02/14/2013 f/u ov/Wert re:  Chief Complaint  Patient presents with  . Follow-up    Wants to make sure his medications  are correct.  Pt c/o increased SOB with exertion.  easily confused with details of care, only using hfa xopenex not the neb xopenex, times right before he goes to rehab and feels he's making slow progress.  >>changed Symbicort to [facility] and atrovent neb to spiriva respimat     04/25/2013 f/u ov/Wert re:  Chief Complaint  Patient presents with  . Acute Visit    Pt c/o increased DOE for the past 3 wks- worse for the past 4-5 days. He states that he is SOB with any exertion at all such as just raising up his arms. He also c/o nasal congestion for the past 3 months. He is using xopenex HFA at least twice per day.    incxrease doe assoc with prod of more mucus and nasal congestion  but better overnight and worse as day goes on, occ green mucus but mostly white No desats walking on 02 at rehab 2lpm rec Prednisone 10 mg take  4 each am x 2 days,   2 each am x 2 days,  1 each am x 2 days and stop  Follow med calendar    06/19/2013 f/u ov/Wert re: GOLD III COPD with some reversibility on dulera 200 2bid  and spriva respimat  Chief Complaint  Patient presents with  . Follow-up    Pt c/o increased DOE for the past month. He states he gets SOB with walking approx 50 ft.    using xopenex qid never neb Did get def transient  benefit from prednisone  >steroid challenge   07/17/2013 Follow up and Med review  Returns for follow up and med review . We reviewed  All his meds and organized them into a med calendar w/ pt education.  Last visit was started on prednisone 20 mg with taper down to 10 mg and hold. Patient says that his breathing has improved. We discussed steroid use, and he wishes to taper off of this due to potential side effects.  rec Follow med calendar closely and bring to each visit.  Decrease prednisone 10mg  1/2 daily for 1 week then 1/2 every other day for 1 week and then stop.  May use saline nasal spray and gel As needed  For congestion .    08/27/2013 f/u ov/Wert re: copd/ 02 dep  with sleep/ ex / on symbicort and spiriva  Chief Complaint  Patient presents with  . Follow-up    ROV : Pt c/o incr SOB and states that he has had a recent decrease in activity--does not feel like his stamina/endurance is where it needs to be.   Variable doe  seemed some better on prednisone - never sob at rest or sleeping  Wears 02 when sleeps at 2 and exercises at 2lpm on treadmill at 1.2 mph level, goes 5 min rest 1 min Has not tried pre treating with xopenex before he does the treadmill, never tried slow setting, never uses neb xopenex rec Try using your xopenex hfa  before planned walk on your treadmill to see what difference if any this makes Prevnar 13 given today Resume prednisone 10 mg x 2 daily when xopenx is not lasting 4 hours, then 1 daily x 5 days and stop      10/23/2013 f/u ov/Wert re: copd/02 dep on symb/spiriva/ xopenex prn plus back up pred short course Chief Complaint  Patient presents with  . Follow-up    SOB   feels prednisone helped a lot with sob and less need for saba Able do treadmill x 15 min x 1 mph no grade no  wheeze overt sinus or hb symptoms.   rec Try using your xopenex hfa  before planned walk on your treadmill to see what difference if any this makes prevnar 13 given today Resume prednisone 10 mg x 2 daily when xopenx is not lasting 4 hours, then 1 daily x 5 days and stop   02/26/2014 f/u ov/Wert re: GOLD III/ confused with action plan portion of med calendar  Chief Complaint  Patient presents with  . Follow-up    Pt c/o increased cough and SOB for the past 3 wks. His cough is worse in the am and is prod with white to light green sputum. He is using xopenex inhaler about 3-4 x per day and has not used neb.    treadmill x 15 min s stopping  x 1.2 mph flat/ on 02 2lpm with sats no lower than 93  Cough is day > noct, not flaring in am, no purulent sputum rec Goal is to keep sats over 90% with activity     06/01/2014 f/u ov/Wert re: COPD GOLD III/  symb/ spiriva respimat prednsione ? Dose / did not bring med calendar Chief Complaint  Patient  presents with  . Follow-up    Pt states his breathing is progressively worse since the last visit.    increased to 46 m on 02  1.2 mph, no tilt s stopping p 10 min to rest on 2lpm with adequate sats reported    some days  Prednisone 10 mg tapered but confused with instructions Drippy nose no better with flonase / clariton > has not tried 1st gen H1  Not much change using saba before ex  >>change claritin to chlortrimeton   06/15/2014 Follow up and Med review : COPD GOLD III/  Pt returns for follow up and med review  We reviewed all his meds and organized them into a med calendar with pt education  Appears to be taking meds correctly.  Last visit with copd flare , changed claritin to chlortabs but unable to tolerate during daytime Due to sleepiness.  Also to take 1/2 toprol for weakness, says he never had any episodes so went back on 1 whole tablet of toprol.  He denies fever, chest pain , orthopnea, increased edema or hemoptysis.  He is starting to feel better and near his baseline.   Current Medications, Allergies, Complete Past Medical History, Past Surgical History, Family History, and Social History were reviewed in [employer] record.  ROS  The following are not active complaints unless bolded sore throat, dysphagia, dental problems, itching, sneezing,  nasal congestion or excess/ purulent secretions, ear ache,   fever, chills, sweats, unintended wt loss, pleuritic or exertional cp, hemoptysis,  orthopnea pnd or leg swelling, presyncope, palpitations, heartburn, abdominal pain, anorexia, nausea, vomiting, diarrhea  or change in bowel or urinary habits, change in stools or urine, dysuria,hematuria,  rash, arthralgias, visual complaints, headache, numbness weakness or ataxia or problems with walking or coordination,  change in mood/affect or memory.             Past  Medical History:  CHRONIC RHINITIS (ICD-472.0)  - Sinus ct March 03, 2010 > thickening only, no acute changes  CHRONIC RESPIRATORY FAILURE (ICD-518.83)  - 02 rx since 2008  COPD UNSPECIFIED (ICD-496)....................................................................[name]  - PFTs 05/04/05 FEV1 36% ratio 28% with 29% improvement after bronchodilators DLCO 48%  - PFT's 03/26/08 30% ratio 34 with 14% improvement after bronchodilators DLC0 38 %  - Start noct 02 at 2lpm 2008 and on 03/04/09 desat @ > 185 ft so rec wear with activtiy > rm to rm  - [facility] June 30, 2008 100% > confirmed March 04, 2009  ISCHEMIC HEART DISEASE (ICD-414.9)  HYPERTENSION (ICD-401.9)  HYPERLIPIDEMIA (ICD-272.4)  SPN RUL by cxr August 19, 2009  COMPLEX MED REGIMEN-Meds reviewed with pt education and computerized med calendar 11/22/09 03/10/2011  , 10/15/2012  07/17/2013 , 12/09/2013 , 06/15/2014           Objective:   Physical Exam  amb wm nad with nl vitals   wt 159 04/28/2010  > 10/14/2010 154  > 10/04/2011 150> 12/21/2011  150 > 149 01/29/2012 >154 04/05/2012 > 05/22/2012 148 > 09/30/2012  146 >>144> 10/15/2012 > 12/18/2012  145 > 148 02/14/2013 >148 03/14/2013 > 04/25/2013 148 > 06/19/2013 150 >>07/17/2013 > 08/27/2013 148 > 10/23/2013  148 >153 12/09/2013 > 02/26/2014  153 > 06/01/2014  152 > 157 06/15/2014   HEENT mild to mod nonspecific turbinate edema.  Oropharynx no thrush or excess pnd or cobblestoning.  No JVD or cervical adenopathy. Mild accessory muscle hypertrophy. Trachea midline, nl thryroid. Chest was hyperinflated by percussion with diminished breath sounds  and moderate increased exp time without wheeze.   Regular rate and rhythm without murmur gallop or rub or increase P2 - 1-2 Plus pitting bilateral lower ext edema.   Abd: no hsm, nl excursion. Ext warm without cyanosis or clubbing.       CXR PA and Lateral:   02/26/2014 :       COPD. There is no evidence of pneumonia, CHF, nor other active cardiopulmonary  disease.    Assessment & Plan:

## 2014-06-15 NOTE — Patient Instructions (Signed)
Follow med calendar closely and bring to each visit.  Please contact office for sooner follow up if symptoms do not improve or worsen or seek emergency care   Follow up Dr. [name]  In 3 months and As needed.

## 2014-06-16 NOTE — Progress Notes (Signed)
Chart and office note reviewed in detail  > agree with a/p as outlined    

## 2014-06-17 ENCOUNTER — Encounter: Payer: Self-pay | Admitting: Family

## 2014-06-18 ENCOUNTER — Ambulatory Visit (INDEPENDENT_AMBULATORY_CARE_PROVIDER_SITE_OTHER): Payer: Medicare Other | Admitting: Family

## 2014-06-18 ENCOUNTER — Encounter: Payer: Self-pay | Admitting: Family

## 2014-06-18 ENCOUNTER — Ambulatory Visit (HOSPITAL_COMMUNITY)
Admission: RE | Admit: 2014-06-18 | Discharge: 2014-06-18 | Disposition: A | Discharge status: Home / Self Care | Payer: Medicare Other | Source: Ambulatory Visit | Attending: Family | Admitting: Family

## 2014-06-18 ENCOUNTER — Ambulatory Visit (INDEPENDENT_AMBULATORY_CARE_PROVIDER_SITE_OTHER)
Admission: RE | Admit: 2014-06-18 | Discharge: 2014-06-18 | Disposition: A | Discharge status: Home / Self Care | Payer: Medicare Other | Source: Ambulatory Visit | Attending: Family | Admitting: Family

## 2014-06-18 VITALS — BP 134/74 | HR 61 | Resp 16 | Ht 69.0 in | Wt 146.0 lb

## 2014-06-18 DIAGNOSIS — R209 Unspecified disturbances of skin sensation: Secondary | ICD-10-CM

## 2014-06-18 DIAGNOSIS — Z87891 Personal history of nicotine dependence: Secondary | ICD-10-CM

## 2014-06-18 DIAGNOSIS — R208 Other disturbances of skin sensation: Secondary | ICD-10-CM | POA: Diagnosis not present

## 2014-06-18 DIAGNOSIS — I779 Disorder of arteries and arterioles, unspecified: Secondary | ICD-10-CM | POA: Diagnosis not present

## 2014-06-18 DIAGNOSIS — I6523 Occlusion and stenosis of bilateral carotid arteries: Secondary | ICD-10-CM | POA: Insufficient documentation

## 2014-06-18 DIAGNOSIS — I70219 Atherosclerosis of native arteries of extremities with intermittent claudication, unspecified extremity: Secondary | ICD-10-CM | POA: Insufficient documentation

## 2014-06-18 DIAGNOSIS — M79661 Pain in right lower leg: Secondary | ICD-10-CM | POA: Insufficient documentation

## 2014-06-18 DIAGNOSIS — R200 Anesthesia of skin: Secondary | ICD-10-CM

## 2014-06-18 NOTE — Progress Notes (Signed)
Established Carotid Patient   History of Present Illness  [name] is a 81 y.o. male patient of Dr. Scot Dock seen for annual carotid duplex for known stenosis. He was initially referred by his cardiologist for monitoring of his ICA's. Pt had a 2003 CABG x 4 vessels. He uses 2 liters portable O2 when walking and sleeping, has severe COPD. Pt was evaluated by a gastroenterologist re RUQ intermittent pain, Prilosec has helped.   Patient has not had previous carotid artery intervention.  Patient has Negative history of TIA or stroke symptom. The patient denies amaurosis fugax or monocular blindness. The patient denies facial drooping. Pt. denies hemiplegia. The patient denies receptive or expressive aphasia.  Arms feel weak, burning, and tingling; this is ongoing. He does not know of any c-spine issues.  Pt reports New Medical or Surgical History: denies  Pt reports sudden onset right calf cramping only with walking on the treadmill after about 10 minutes; this started about a month ago, is actually improving to the point in which it is not noticeable; he walks 20 minutes on his treadmill daily, is limited by dyspnea. He has seen a neurologist for dizziness, states he was diagnosed with paroxysmal vertigo. He has ankle swelling later in the day, left worse than right, no ankle swelling in the morning, he has knee high compression hose, advised to donn before he gets OOB.  Weight on 05/14/13 was 147 lb, 146 lb today.  Pt Diabetic: No Pt smoker: former smoker, quit in 1983  Pt meds include: Statin : Yes ASA: Yes Other anticoagulants/antiplatelets: no   Past Medical History  Diagnosis Date  . Hypertension   . S/P CABG x 4   . CAD (coronary artery disease)   . COPD (chronic obstructive pulmonary disease)   . Hyperlipidemia   . Chronic rhinitis   . Chronic respiratory failure   . Peripheral vascular disease   . Upper respiratory infection 08/2012  . Asthma   . Syncope and  collapse   . Carotid artery occlusion     Social History History  Substance Use Topics  . Smoking status: Former Smoker -- 2.00 packs/day for 33 years    Types: Cigarettes    Quit date: 01/09/1982  . Smokeless tobacco: Never Used  . Alcohol Use: Yes     Comment: 1.5-3 oz daily    Family History Family History  Problem Relation Age of Onset  . Heart disease Mother   . Breast cancer Mother   . Cancer Mother     Breast cancer  . Hypertension Mother   . Other Mother     AAA  and   Amputation  . Heart disease Father     Before age 10  . Heart attack Father   . Heart disease Brother   . Hyperlipidemia Brother   . Hypertension Brother   . Heart disease Brother   . Stroke Brother     x3, still living   . Peripheral vascular disease Brother   . Allergies Brother     Surgical History Past Surgical History  Procedure Laterality Date  . Coronary artery bypass graft  2003  . Cataract extraction  11-20-11  . Lithotripsy    . Prostate biopsy  Oct. 2014    Allergies  Allergen Reactions  . Cefdinir     REACTION: diarrhea  . Sulfonamide Derivatives     REACTION: facial/tongue swelling    Current Outpatient Prescriptions  Medication Sig Dispense Refill  . ALPRAZolam Duanne Moron)  0.25 MG tablet Take 0.25 mg by mouth 2 (two) times daily as needed for anxiety.    Marland Kitchen aspirin 325 MG tablet Take 1 tablet (325 mg total) by mouth every morning.    Marland Kitchen atorvastatin (LIPITOR) 20 MG tablet TAKE 1 TABLET BY MOUTH EVERY DAY 30 tablet 10  . azithromycin (ZITHROMAX Z-PAK) 250 MG tablet Take as directed    . budesonide-formoterol (SYMBICORT) 160-4.5 MCG/ACT inhaler Take 2 puffs first thing in am and then another 2 puffs about 12 hours later. 1 Inhaler 11  . Cholecalciferol (VITAMIN D3) 1000 UNITS CAPS Take 1 capsule (1,000 Units total) by mouth daily.    . finasteride (PROSCAR) 5 MG tablet Take 5 mg by mouth every morning.    . furosemide (LASIX) 20 MG tablet Take 1 tablet (20 mg total) by mouth  daily as needed for fluid. 30 tablet 1  . guaiFENesin (MUCINEX) 600 MG 12 hr tablet 1-2 every 12 hours as needed for chest congestion    . levalbuterol (XOPENEX) 1.25 MG/3ML nebulizer solution Take 1.25 mg by nebulization every 4 (four) hours as needed for wheezing or shortness of breath (((PLAN B))). Dx 496    . loratadine (CLARITIN) 10 MG tablet Take 10 mg by mouth daily as needed for allergies.    . metoprolol succinate (TOPROL XL) 25 MG 24 hr tablet Take 1 tablet (25 mg total) by mouth daily. 30 tablet 6  . mometasone (NASONEX) 50 MCG/ACT nasal spray Place 2 sprays into the nose daily. 17 g 5  . Multiple Vitamin (MULTIVITAMIN) tablet Take 1 tablet by mouth daily.    Marland Kitchen NITROSTAT 0.4 MG SL tablet DISSOLVE 1 TABLET UNDER TONGUE AS DIRECTED AND AS NEEDED FOR CHEST PAIN 25 tablet 0  . omeprazole (PRILOSEC) 40 MG capsule Take 1 capsule by mouth 2 (two) times daily.    . predniSONE (DELTASONE) 10 MG tablet Take  2 each am until better then 1 each am (Patient taking differently: 1/2 each am) 100 tablet 0  . Sodium Chloride-Sodium Bicarb (AYR SALINE NASAL RINSE) 1.57 G PACK May use rinses/gel as needed for nasal congestion    . SPIRIVA RESPIMAT 2.5 MCG/ACT AERS INHALE 2 PUFFS BY MOUTH EVERY DAY 4 g 6  . Tiotropium Bromide Monohydrate (SPIRIVA RESPIMAT) 2.5 MCG/ACT AERS Inhale 2 puffs into the lungs daily. 4 g 0  . XOPENEX HFA 45 MCG/ACT inhaler INHALE 2 PUFFS BY MOUTH EVERY 4 HOURS AS NEEDED FOR WHEEZING OR SHORTNESS OF BREATH 15 g 3   No current facility-administered medications for this visit.    Review of Systems : See HPI for pertinent positives and negatives.  Physical Examination  Filed Vitals:   06/18/14 1538 06/18/14 1543  BP: 148/73 134/74  Pulse: 61 61  Resp:  16  Height:  5\' 9"  (1.753 m)  Weight:  146 lb (66.225 kg)  SpO2:  99%   Body mass index is 21.55 kg/(m^2).   General: WDWN male in NAD GAIT: normal Eyes: PERRLA Pulmonary: Non-labored, limited air movement in all  fields, no rales, no rhonchi, & no wheezing. He is using his portable liquid oxygen dispenser, 2 L/[place]  Cardiac: regular Rhythm, no detected murmur.  VASCULAR EXAM  Left Right  Carotid Bruits Negative Negative  RADIAL PULSE 2+ palpable 2+ palpable  AORTA Not palpable N/A  FEMORAL 1+ palpable Not palpable  POPLITEAL Not palpable Not palpable  POSTERIOR TIBIAL Not palpable Not palpable  DORSALIS PEDIS Not palpable Not palpable  Gastrointestinal: soft, nontender, BS WNL, no r/g, no palpable masses.  Musculoskeletal: Age appropriate muscle atrophy/wasting. M/S 4/5 throughout, Extremities without ischemic changes. Pitting edema in both ankles: right: 1+, Left: 2+  Neurologic: A&O X 3; Appropriate Affect, Speech is normal CN 2-12 intact except has some hearing loss, Motor exam as listed above.          Non-Invasive Vascular Imaging CAROTID DUPLEX 06/18/2014   CEREBROVASCULAR DUPLEX EVALUATION    INDICATION: Carotid disease    PREVIOUS INTERVENTION(S):     DUPLEX EXAM:     RIGHT  LEFT  Peak Systolic Velocities (cm/s) End Diastolic Velocities (cm/s) Plaque LOCATION Peak Systolic Velocities (cm/s) End Diastolic Velocities (cm/s) Plaque  78 15  CCA PROXIMAL 84 14   59 11  CCA MID 71 15 HT  49 9 HT CCA DISTAL 59 13 HT  87 6 HT ECA 84 0 HT  160 46 HT ICA PROXIMAL 98 25 HT  66 17  ICA MID 93 21   59 20  ICA DISTAL 57 19     3.3 ICA / CCA Ratio (PSV) 1.7  Antegrade Vertebral Flow Antegrade  086 Brachial Systolic Pressure (mmHg) 578  Multiphasic (subclavian artery) Brachial Artery Waveforms Multiphasic (subclavian artery)    Plaque Morphology:  HM = Homogeneous, HT = Heterogeneous, CP = Calcific Plaque, SP = Smooth Plaque, IP = Irregular Plaque  ADDITIONAL FINDINGS: No significant stenosis of the bilateral external or common carotid arteries.    IMPRESSION:  Doppler velocities suggest a 50-69% right proximal internal carotid artery stenosis and a 1-49% left proximal internal carotid artery stenosis.    Compared to the previous exam:  No significant change noted when compared to the previous exam on 05/14/13.      ABI (Date: 06/18/2014)  R: 0.79 (review of old records: 0.72, 12/27/1999), DP: monophasic, PT: monophasic, TBI: 0.31, absolute toe pressure: 52  L: 0.73 (0.73), DP: monophasic, PT: monophasic, TBI: 0.37, absolute toe pressure: 62    Assessment: [name] is a 81 y.o. male who has no history of stroke or TIA. Today's carotid Duplex suggests 50-69% right proximal internal carotid artery stenosis and a 1-49% left proximal internal carotid artery stenosis. No significant change noted when compared to the previous exam on 05/14/13.   He developed sudden right calf cramping while walking on his treadmill about a month ago; this has improved to almost resolved. He has no signs of ischemia in his lower legs or feet.  Bilateral ABI's today indicate moderate arterial occlusive disease, actually about the same compared to 2001 ABI's, slightly improved in right leg. His right calf cramping was therefore not likely to be related to stable lower extremity moderate arterial occlusive disease. He has occasional pain in his right hip that radiates distally at his lateral thigh which seems consistent with radiculopathy. He also takes furosemide prn for pitting edema in his ankles and does not take potassium replacement. He is encouraged to continue his graduated walking program which will encourage collateral arterial perfusion and support his lung function.    Plan: Follow-up in 1 year with Carotid Duplex and ABI's.   I discussed in depth with the patient the nature of atherosclerosis, and emphasized the importance of maximal medical management including strict control of blood pressure, blood glucose, and lipid levels, obtaining regular exercise,  and continued cessation of smoking.  The patient is aware that without maximal medical management the underlying atherosclerotic disease process will progress, limiting the benefit of any interventions.  The patient was given information about stroke prevention and what symptoms should prompt the patient to seek immediate medical care. Thank you for allowing [place] to participate in this patient's care.  Clemon Chambers, RN, MSN, FNP-C [facility] Office: [facility] Physician: Oneida Alar  06/18/2014 3:28 PM

## 2014-06-18 NOTE — Patient Instructions (Addendum)

## 2014-06-23 NOTE — Addendum Note (Signed)
Addended by: Inge Rise on: 06/23/2014 03:55 PM   Modules accepted: Orders, Medications

## 2014-06-30 DIAGNOSIS — E784 Other hyperlipidemia: Secondary | ICD-10-CM | POA: Diagnosis not present

## 2014-06-30 DIAGNOSIS — R0600 Dyspnea, unspecified: Secondary | ICD-10-CM | POA: Diagnosis not present

## 2014-06-30 DIAGNOSIS — R0602 Shortness of breath: Secondary | ICD-10-CM | POA: Diagnosis not present

## 2014-06-30 DIAGNOSIS — I872 Venous insufficiency (chronic) (peripheral): Secondary | ICD-10-CM | POA: Diagnosis not present

## 2014-06-30 DIAGNOSIS — I2510 Atherosclerotic heart disease of native coronary artery without angina pectoris: Secondary | ICD-10-CM | POA: Diagnosis not present

## 2014-06-30 DIAGNOSIS — I6523 Occlusion and stenosis of bilateral carotid arteries: Secondary | ICD-10-CM | POA: Diagnosis not present

## 2014-06-30 DIAGNOSIS — J449 Chronic obstructive pulmonary disease, unspecified: Secondary | ICD-10-CM | POA: Diagnosis not present

## 2014-06-30 DIAGNOSIS — I119 Hypertensive heart disease without heart failure: Secondary | ICD-10-CM | POA: Diagnosis not present

## 2014-06-30 DIAGNOSIS — I2781 Cor pulmonale (chronic): Secondary | ICD-10-CM | POA: Diagnosis not present

## 2014-06-30 DIAGNOSIS — I739 Peripheral vascular disease, unspecified: Secondary | ICD-10-CM | POA: Diagnosis not present

## 2014-06-30 DIAGNOSIS — I4949 Other premature depolarization: Secondary | ICD-10-CM | POA: Diagnosis not present

## 2014-06-30 DIAGNOSIS — E785 Hyperlipidemia, unspecified: Secondary | ICD-10-CM | POA: Diagnosis not present

## 2014-07-07 ENCOUNTER — Other Ambulatory Visit: Payer: Self-pay | Admitting: Internal Medicine

## 2014-07-08 ENCOUNTER — Other Ambulatory Visit: Payer: Self-pay | Admitting: Internal Medicine

## 2014-07-16 ENCOUNTER — Telehealth: Payer: Self-pay | Admitting: Internal Medicine

## 2014-07-16 DIAGNOSIS — Z961 Presence of intraocular lens: Secondary | ICD-10-CM | POA: Diagnosis not present

## 2014-07-16 DIAGNOSIS — H26491 Other secondary cataract, right eye: Secondary | ICD-10-CM | POA: Diagnosis not present

## 2014-07-16 NOTE — Telephone Encounter (Signed)
Pt aware of recs.  Nothing further needed. 

## 2014-07-16 NOTE — Telephone Encounter (Signed)
Spoke with pt, states that he is taking maintenance prednisone 20mg  since July 2nd.  Since 07/13/14 c/o feeling "hot and puffy", bp running 170's/100's.  States his bp normally runs 120's/70's.   Pt states his bp is rising at night, and this is when his s/s are worse.  Pt is concerned that this is from the prednisone.   MW please advise on recs.  Thanks!

## 2014-07-16 NOTE — Telephone Encounter (Signed)
Likely it is so would reduce the prednisone as per the med calendar instructions and let his primary know if not feeling better as her tapers

## 2014-07-20 ENCOUNTER — Telehealth: Payer: Self-pay | Admitting: Internal Medicine

## 2014-07-20 DIAGNOSIS — J449 Chronic obstructive pulmonary disease, unspecified: Secondary | ICD-10-CM

## 2014-07-20 NOTE — Telephone Encounter (Signed)
Patient needs a written rx for oxygen.  Patient will be traveling and needs a written rx to take with him. Patient using 2L at night and daily with exertion.  Patient leaving Wednesday morning. Would like rx left at front desk to pick up on his way out of town.  MW - ok to write rx?

## 2014-07-20 NOTE — Telephone Encounter (Signed)
Fine with me but needs to be sure his company knows where he's going in case runs into any problem with equipment

## 2014-07-20 NOTE — Telephone Encounter (Signed)
rx printed, stamped and placed at front for patient to take with him out of town Nothing further needed.

## 2014-07-22 ENCOUNTER — Telehealth: Payer: Self-pay | Admitting: Internal Medicine

## 2014-07-22 NOTE — Telephone Encounter (Signed)
Spoke with pt. He spoke with spouse and she did pick up paperwork. Nothing further needed

## 2014-09-09 DIAGNOSIS — R1011 Right upper quadrant pain: Secondary | ICD-10-CM | POA: Diagnosis not present

## 2014-09-19 ENCOUNTER — Emergency Department (HOSPITAL_COMMUNITY)
Admission: EM | Admit: 2014-09-19 | Discharge: 2014-09-19 | Disposition: A | Discharge status: Home / Self Care | Payer: Medicare Other | Attending: Emergency Medicine | Admitting: Emergency Medicine

## 2014-09-19 ENCOUNTER — Encounter (HOSPITAL_COMMUNITY): Payer: Self-pay | Admitting: Emergency Medicine

## 2014-09-19 DIAGNOSIS — Z7982 Long term (current) use of aspirin: Secondary | ICD-10-CM | POA: Diagnosis not present

## 2014-09-19 DIAGNOSIS — Z79899 Other long term (current) drug therapy: Secondary | ICD-10-CM | POA: Insufficient documentation

## 2014-09-19 DIAGNOSIS — R040 Epistaxis: Secondary | ICD-10-CM | POA: Insufficient documentation

## 2014-09-19 DIAGNOSIS — Z951 Presence of aortocoronary bypass graft: Secondary | ICD-10-CM | POA: Diagnosis not present

## 2014-09-19 DIAGNOSIS — I10 Essential (primary) hypertension: Secondary | ICD-10-CM | POA: Insufficient documentation

## 2014-09-19 DIAGNOSIS — E785 Hyperlipidemia, unspecified: Secondary | ICD-10-CM | POA: Insufficient documentation

## 2014-09-19 DIAGNOSIS — Z87891 Personal history of nicotine dependence: Secondary | ICD-10-CM | POA: Insufficient documentation

## 2014-09-19 DIAGNOSIS — I2510 Atherosclerotic heart disease of native coronary artery without angina pectoris: Secondary | ICD-10-CM | POA: Diagnosis not present

## 2014-09-19 DIAGNOSIS — J449 Chronic obstructive pulmonary disease, unspecified: Secondary | ICD-10-CM | POA: Insufficient documentation

## 2014-09-19 MED ORDER — METOPROLOL SUCCINATE ER 25 MG PO TB24
25.0000 mg | ORAL_TABLET | Freq: Every day | ORAL | Status: DC
  Administered 2014-09-19: 25 mg via ORAL
  Filled 2014-09-19: qty 1

## 2014-09-19 MED ORDER — OXYMETAZOLINE HCL 0.05 % NA SOLN
1.0000 | Freq: Once | NASAL | Status: AC
  Administered 2014-09-19: 1 via NASAL
  Filled 2014-09-19: qty 15

## 2014-09-19 NOTE — Discharge Instructions (Signed)

## 2014-09-19 NOTE — ED Provider Notes (Signed)
CSN: [id number]     Arrival date & time 09/19/14  1554 History   First MD Initiated Contact with Patient 09/19/14 1630     Chief Complaint  Patient presents with  . Epistaxis     (Consider location/radiation/quality/duration/timing/severity/associated sxs/prior Treatment) HPI Comments: 81 year old male with extensive past medical history including COPD on home oxygen, CAD status post CABG, hypertension, PVD, allergic rhinitis presents with epistaxis. The patient states that at 1:30 today, he began having a nosebleed that was primarily out of his right naris and was persistent despite holding pressure on his nose. He has occasionally had mild nosebleeds in the past but never this bad her for this long. He does note that he stop using nasal next approximately 2 weeks ago. He uses nasal saline once daily. He does not use a humidifier for his oxygen at night because water builds up in the line. He has not yet taken his blood pressure medication today which she normally takes around 1 PM. No trauma to his nose or history of nasal/sinus surgery.  Patient is a 81 y.o. male presenting with nosebleeds. The history is provided by the patient.  Epistaxis   Past Medical History  Diagnosis Date  . Hypertension   . S/P CABG x 4   . CAD (coronary artery disease)   . COPD (chronic obstructive pulmonary disease)   . Hyperlipidemia   . Chronic rhinitis   . Chronic respiratory failure   . Peripheral vascular disease   . Upper respiratory infection 08/2012  . Asthma   . Syncope and collapse   . Carotid artery occlusion    Past Surgical History  Procedure Laterality Date  . Coronary artery bypass graft  2003  . Cataract extraction  11-20-11  . Lithotripsy    . Prostate biopsy  Oct. 2014   Family History  Problem Relation Age of Onset  . Heart disease Mother   . Breast cancer Mother   . Cancer Mother     Breast cancer  . Hypertension Mother   . Other Mother     AAA  and   Amputation  . Heart  disease Father     Before age 26  . Heart attack Father   . Heart disease Brother   . Hyperlipidemia Brother   . Hypertension Brother   . Heart disease Brother   . Stroke Brother     x3, still living   . Peripheral vascular disease Brother   . Allergies Brother    Social History  Substance Use Topics  . Smoking status: Former Smoker -- 2.00 packs/day for 33 years    Types: Cigarettes    Quit date: 01/09/1982  . Smokeless tobacco: Never Used  . Alcohol Use: Yes     Comment: 1.5-3 oz daily    Review of Systems  HENT: Positive for nosebleeds.    10 Systems reviewed and are negative for acute change except as noted in the HPI.    Allergies  Sulfonamide derivatives and Cefdinir  Home Medications   Prior to Admission medications   Medication Sig Start Date End Date Taking? Authorizing Provider  ALPRAZolam (XANAX) 0.25 MG tablet Take 0.25 mg by mouth every 8 (eight) hours as needed for anxiety.    Yes Historical Provider, MD  aspirin 325 MG tablet Take 1 tablet (325 mg total) by mouth every morning. 11/24/12  Yes Festus Aloe, MD  atorvastatin (LIPITOR) 20 MG tablet TAKE 1 TABLET BY MOUTH EVERY DAY Patient taking differently: TAKE 20  MG  BY MOUTH EVERY DAY at bedtime 12/30/13  Yes Belva Crome, MD  Cholecalciferol (VITAMIN D3) 1000 UNITS CAPS Take 1 capsule (1,000 Units total) by mouth daily. Patient taking differently: Take 1,000 Units by mouth every morning.  03/14/11  Yes Tammy S Parrett, NP  finasteride (PROSCAR) 5 MG tablet Take 5 mg by mouth every morning.   Yes Historical Provider, MD  furosemide (LASIX) 20 MG tablet Take 1 tablet (20 mg total) by mouth daily as needed for fluid. Patient taking differently: Take 20 mg by mouth as needed for fluid.  12/17/13  Yes Tanda Rockers, MD  loratadine (CLARITIN) 10 MG tablet Take 10 mg by mouth daily as needed for allergies.   Yes Historical Provider, MD  metoprolol succinate (TOPROL XL) 25 MG 24 hr tablet Take 1 tablet (25 mg  total) by mouth daily. 04/27/14  Yes Belva Crome, MD  mometasone (NASONEX) 50 MCG/ACT nasal spray Place 2 sprays into the nose daily. Patient taking differently: Place 2 sprays into the nose every morning.  04/28/14  Yes Tanda Rockers, MD  Multiple Vitamin (MULTIVITAMIN) tablet Take 1 tablet by mouth daily.   Yes Historical Provider, MD  NITROSTAT 0.4 MG SL tablet DISSOLVE 1 TABLET UNDER TONGUE AS DIRECTED AND AS NEEDED FOR CHEST PAIN 03/20/14  Yes Belva Crome, MD  omeprazole (PRILOSEC) 40 MG capsule Take 1 capsule by mouth 2 (two) times daily before a meal.  04/24/14  Yes Historical Provider, MD  OXYGEN 2 l/m at bedtime and as needed   Yes Historical Provider, MD  predniSONE (DELTASONE) 10 MG tablet TAKE 2 TABLETS BY MOUTH EACH MORNING UNTIL BETTER, THEN TAKE 1 TABLET EACH MORNING Patient taking differently: TAKE 20 MG BY MOUTH DAILY AS NEEDED WHEN FEELING BAD 07/09/14  Yes Tanda Rockers, MD  SYMBICORT 160-4.5 MCG/ACT inhaler TAKE 2 PUFFS EVERY MORNING AND THEN TAKE ANOTHER 2 PUFFS ABOUT 12 HOURS LATER 07/07/14  Yes Tanda Rockers, MD  Tiotropium Bromide Monohydrate (SPIRIVA RESPIMAT) 2.5 MCG/ACT AERS Inhale 2 puffs into the lungs daily. 06/15/14 09/19/14 Yes Tammy S Parrett, NP  XOPENEX HFA 45 MCG/ACT inhaler INHALE 2 PUFFS BY MOUTH EVERY 4 HOURS AS NEEDED FOR WHEEZING OR SHORTNESS OF BREATH 05/18/14  Yes Tanda Rockers, MD   BP 169/83 mmHg  Pulse 83  Temp(Src) 98 F (36.7 C) (Oral)  Resp 19  SpO2 97% Physical Exam  Constitutional: He is oriented to person, place, and time. He appears well-developed and well-nourished. No distress.  HENT:  Head: Normocephalic and atraumatic.  Moist mucous membranes, small amount dark clotted blood in R naris, dried blood L naris; small amount BRB in posterior oropharynx, no active bleeding  Eyes: Conjunctivae are normal. Pupils are equal, round, and reactive to light.  Neck: Neck supple.  Pulmonary/Chest: No stridor.  Neurological: He is alert and oriented to  person, place, and time.  Skin: Skin is warm and dry. No rash noted.  Psychiatric: He has a normal mood and affect. Judgment and thought content normal.  Nursing note and vitals reviewed.   ED Course  Procedures (including critical care time) Labs Review Labs Reviewed - No data to display   MDM   Final diagnoses:  Epistaxis   81 year old male with COPD on home oxygen who presents with epistaxis that began around 1:30 and has persisted despite nose pressure. At presentation, the patient was awake, alert, and in no acute distress. Vital signs notable for hypertension at 169/83. No  respiratory distress and no active bleeding on exam. He had just recently spit out a large blood clot and his nose stopped bleeding just prior to walking into the ED. Administered Afrin in bilateral nares and patient held pressure for 15-20 minutes continuously. Gave the patient his home dose of metoprolol.  On reexamination approximately 3 hours after arrival to the ED, the patient remains well-appearing with no further episodes of epistaxis.  I instructed on supportive care at home including increasing daily nasal saline, using humidifier at night, and restarting his usual nasonex. Also instructed on care at home for epistaxis including Afrin and continuous pressure for 20 minutes. He voiced understanding of plan as well as return precautions. Patient discharged in satisfactory condition.    Sharlett Iles, MD 09/19/14 [phone]

## 2014-09-19 NOTE — ED Notes (Signed)
Pt states he has had a nose bleed since about 1330. Pt states he does have nose bleeds in the past but they normally stop but this one hasn't.

## 2014-09-21 ENCOUNTER — Encounter: Payer: Self-pay | Admitting: Internal Medicine

## 2014-09-21 ENCOUNTER — Ambulatory Visit (INDEPENDENT_AMBULATORY_CARE_PROVIDER_SITE_OTHER): Payer: Medicare Other | Admitting: Internal Medicine

## 2014-09-21 VITALS — BP 138/70 | HR 74 | Ht 69.0 in | Wt 146.0 lb

## 2014-09-21 DIAGNOSIS — I779 Disorder of arteries and arterioles, unspecified: Secondary | ICD-10-CM

## 2014-09-21 DIAGNOSIS — J449 Chronic obstructive pulmonary disease, unspecified: Secondary | ICD-10-CM | POA: Diagnosis not present

## 2014-09-21 DIAGNOSIS — J9611 Chronic respiratory failure with hypoxia: Secondary | ICD-10-CM | POA: Diagnosis not present

## 2014-09-21 MED ORDER — DYMISTA 137-50 MCG/ACT NA SUSP: 1.0000 | Freq: Two times a day (BID) | NASAL | Status: DC

## 2014-09-21 NOTE — Progress Notes (Signed)
Subjective:    Patient ID: [name], male    DOB: [birth year]   MRN: [id number]     Summary: Pulmonary/ f/u ov try astepro for pnds   Primary Provider/Referring Provider: Maylon [name]    Brief patient profile:  75   yowm last smoked 1983 with GOLD III COPD with some reversibility and 02 dep at hs and with activity    History of Present Illness   05/01/2011 f/u ov/[name] cc 50 ft outside, one aisle at [facility], whole store with 02. No cough and minimal need for saba Seen at [facility] rec change ipatropium neb  to atrovent > no change doe. rec ry tudorza one puff twice daily and stop atrovent and see how it effects your activity performance, if not benefit ok to change back to atrovent   10/04/2011 f/u ov/[name] did not bring med calendar cc  Loosing ground on tudorza so went to atrovent neb and seemed to help. rec Plan A = use automatically no matter what Symbicort followed by Atrovent each am and symbiocort again in 12 hours plus continue the atrovent though the day in between about every 4-6 hours Only use your albuterol (xopenex hfa -puffer-  is Plan B,  Nebulizer is plan C) .      12/18/2012 f/u ov/[name] re: copd/ 02  at bedtime and with exertion  Chief Complaint  Patient presents with  . Follow-up    Increased sob at all times x1 month.  using O2 more frequently   actually no sob at rest or at hs, wears 02 with activity and when he does wear it the sats are fine even when he has to stop due to sob rec Plan A= automatic  symbiocort and tudorza twice daily and ok to use your 02 more consistently with activity that you know makes you short of breath Plan B = backup > xopenex 2 every 4 if needed if can't catch your breath Plan C = xopenex neb every 4 hours if plan B not working, always try plan B first  Please see patient coordinator before you leave today  to schedule rehab  02/14/2013 f/u ov/[name] re:  Chief Complaint  Patient presents with  . Follow-up    Wants to make sure his medications  are correct.  Pt c/o increased SOB with exertion.  easily confused with details of care, only using hfa xopenex not the neb xopenex, times right before he goes to rehab and feels he's making slow progress.  >>changed Symbicort to [facility] and atrovent neb to spiriva respimat     04/25/2013 f/u ov/[name] re:  Chief Complaint  Patient presents with  . Acute Visit    Pt c/o increased DOE for the past 3 wks- worse for the past 4-5 days. He states that he is SOB with any exertion at all such as just raising up his arms. He also c/o nasal congestion for the past 3 months. He is using xopenex HFA at least twice per day.    incxrease doe assoc with prod of more mucus and nasal congestion  but better overnight and worse as day goes on, occ green mucus but mostly white No desats walking on 02 at rehab 2lpm rec Prednisone 10 mg take  4 each am x 2 days,   2 each am x 2 days,  1 each am x 2 days and stop  Follow med calendar    06/19/2013 f/u ov/[name] re: GOLD III COPD with some reversibility on dulera 200 2bid  and spriva respimat  Chief Complaint  Patient presents with  . Follow-up    Pt c/o increased DOE for the past month. He states he gets SOB with walking approx 50 ft.    using xopenex qid never neb Did get def transient  benefit from prednisone  >steroid challenge   07/17/2013 Follow up and Med review  Returns for follow up and med review . We reviewed  All his meds and organized them into a med calendar w/ pt education.  Last visit was started on prednisone 20 mg with taper down to 10 mg and hold. Patient says that his breathing has improved. We discussed steroid use, and he wishes to taper off of this due to potential side effects.  rec Follow med calendar closely and bring to each visit.  Decrease prednisone 10mg  1/2 daily for 1 week then 1/2 every other day for 1 week and then stop.  May use saline nasal spray and gel As needed  For congestion .    08/27/2013 f/u ov/[name] re: copd/ 02 dep  with sleep/ ex / on symbicort and spiriva  Chief Complaint  Patient presents with  . Follow-up    ROV : Pt c/o incr SOB and states that he has had a recent decrease in activity--does not feel like his stamina/endurance is where it needs to be.   Variable doe  seemed some better on prednisone - never sob at rest or sleeping  Wears 02 when sleeps at 2 and exercises at 2lpm on treadmill at 1.2 mph level, goes 5 min rest 1 min Has not tried pre treating with xopenex before he does the treadmill, never tried slow setting, never uses neb xopenex rec Try using your xopenex hfa  before planned walk on your treadmill to see what difference if any this makes Prevnar 13 given today Resume prednisone 10 mg x 2 daily when xopenx is not lasting 4 hours, then 1 daily x 5 days and stop      10/23/2013 f/u ov/[name] re: copd/02 dep on symb/spiriva/ xopenex prn plus back up pred short course Chief Complaint  Patient presents with  . Follow-up    SOB   feels prednisone helped a lot with sob and less need for saba Able do treadmill x 15 min x 1 mph no grade no  wheeze overt sinus or hb symptoms.   rec Try using your xopenex hfa  before planned walk on your treadmill to see what difference if any this makes prevnar 13 given today Resume prednisone 10 mg x 2 daily when xopenx is not lasting 4 hours, then 1 daily x 5 days and stop   02/26/2014 f/u ov/[name] re: GOLD III/ confused with action plan portion of med calendar  Chief Complaint  Patient presents with  . Follow-up    Pt c/o increased cough and SOB for the past 3 wks. His cough is worse in the am and is prod with white to light green sputum. He is using xopenex inhaler about 3-4 x per day and has not used neb.    treadmill x 15 min s stopping  x 1.2 mph flat/ on 02 2lpm with sats no lower than 93  Cough is day > noct, not flaring in am, no purulent sputum rec Goal is to keep sats over 90% with activity     06/01/2014 f/u ov/[name] re: COPD GOLD III/  symb/ spiriva respimat prednsione ? Dose / did not bring med calendar Chief Complaint  Patient  presents with  . Follow-up    Pt states his breathing is progressively worse since the last visit.    increased to 45 m on 02  1.2 mph, no tilt s stopping p 10 min to rest on 2lpm with adequate sats reported    some days  Prednisone 10 mg tapered but confused with instructions Drippy nose no better with flonase / clariton > has not tried 1st gen H1  Not much change using saba before ex  >>change claritin to chlortrimeton   06/15/2014 Follow up and Med review : COPD GOLD III/  Pt returns for follow up and med review  We reviewed all his meds and organized them into a med calendar with pt education  Appears to be taking meds correctly.  Last visit with copd flare , changed claritin to chlortabs but unable to tolerate during daytime Due to sleepiness.  Also to take 1/2 toprol for weakness, says he never had any episodes so went back on 1 whole tablet of toprol.  rec Follow med calendar closely and bring to each visit.  Please contact office for sooner follow up if symptoms do not improve or worsen or seek emergency care   Follow up Dr. [name]  In 3 months and As needed.    09/21/2014 f/u ov/Matej Sappenfield re:  Chief Complaint  Patient presents with  . Follow-up    Pt c/o increased SOB with activity, occ chest tightness, prod cough early mornings with white mucus. Constant sinus drainage and nose bleed on 09/19/14.     Doe better until 3 weeks prior to OV  Then worse sob assoc with nasal congestion off nasonex  - note also stopped omeprazole "I don't have reflux" - see Dg  Es below   No obvious day to day or daytime variability or assoc purulent or excess mucus or cp or chest tightness, subjective wheeze or overt   hb symptoms. No unusual exp hx or h/o childhood pna/ asthma or knowledge of premature birth.  Sleeping ok without nocturnal  or early am exacerbation  of respiratory  c/o's or need for noct saba.  Also denies any obvious fluctuation of symptoms with weather or environmental changes or other aggravating or alleviating factors except as outlined above   Current Medications, Allergies, Complete Past Medical History, Past Surgical History, Family History, and Social History were reviewed in [employer] record.  ROS  The following are not active complaints unless bolded sore throat, dysphagia, dental problems, itching, sneezing,  nasal congestion or excess/ purulent secretions, ear ache,   fever, chills, sweats, unintended wt loss, classically pleuritic or exertional cp, hemoptysis,  orthopnea pnd or leg swelling, presyncope, palpitations, abdominal pain, anorexia, nausea, vomiting, diarrhea  or change in bowel or bladder habits, change in stools or urine, dysuria,hematuria,  rash, arthralgias, visual complaints, headache, numbness, weakness or ataxia or problems with walking or coordination,  change in mood/affect or memory.              Past Medical History:  CHRONIC RHINITIS (ICD-472.0)  - Sinus ct March 03, 2010 > thickening only, no acute changes  CHRONIC RESPIRATORY FAILURE (ICD-518.83)  - 02 rx since 2008  COPD UNSPECIFIED (ICD-496)....................................................................[name]  - PFTs 05/04/05 FEV1 36% ratio 28% with 29% improvement after bronchodilators DLCO 48%  - PFT's 03/26/08 30% ratio 34 with 14% improvement after bronchodilators DLC0 38 %  - Start noct 02 at 2lpm 2008 and on 03/04/09 desat @ > 185 ft so rec wear with  activtiy > rm to rm  - [facility] June 30, 2008 100% > confirmed March 04, 2009  ISCHEMIC HEART DISEASE (ICD-414.9)  HYPERTENSION (ICD-401.9)  HYPERLIPIDEMIA (ICD-272.4)  SPN RUL by cxr August 19, 2009  COMPLEX MED REGIMEN-Meds reviewed with pt education and computerized med calendar 11/22/09 03/10/2011  , 10/15/2012  07/17/2013 , 12/09/2013 , 06/15/2014           Objective:   Physical Exam  amb wm nad with nl vitals    wt 159 04/28/2010  > 10/14/2010 154  > 10/04/2011 150> 12/21/2011  150 > 149 01/29/2012 >154 04/05/2012 > 05/22/2012 148 > 09/30/2012  146 >>144> 10/15/2012 > 12/18/2012  145 > 148 02/14/2013 >148 03/14/2013 > 04/25/2013 148 > 06/19/2013 150 >>07/17/2013 > 08/27/2013 148 > 10/23/2013  148 >153 12/09/2013 > 02/26/2014  153 > 06/01/2014  152 > 157 06/15/2014 > 09/21/2014 146   HEENT  mod nonspecific turbinate edema.  Oropharynx no thrush or excess pnd or cobblestoning.  No JVD or cervical adenopathy. Mild accessory muscle hypertrophy. Trachea midline, nl thryroid. Chest was hyperinflated by percussion with diminished breath sounds and moderate increased exp time without wheeze.   Regular rate and rhythm without murmur gallop or rub or increase P2 - 1 + pitting bilateral lower ext edema.   Abd: no hsm, nl excursion. Ext warm without cyanosis or clubbing.        I personally reviewed images and agree with radiology impression as follows:  DgEs  04/28/14  Mild gastroesophageal reflux. No hiatal hernia.        Assessment & Plan:

## 2014-09-21 NOTE — Patient Instructions (Signed)
Resume omeprazole 40 mg Take 30- 60 min before your first and last meals of the day   Restart dymista one twice daily   Continue prednisone 20 mg daily until better  then taper   See calendar for specific medication instructions and bring it back for each and every office visit for every healthcare provider you see.  Without it,  you may not receive the best quality medical care that we feel you deserve.  You will note that the calendar groups together  your maintenance  medications that are timed at particular times of the day.  Think of this as your checklist for what your doctor has instructed you to do until your next evaluation to see what benefit  there is  to staying on a consistent group of medications intended to keep you well.  The other group at the bottom is entirely up to you to use as you see fit  for specific symptoms that may arise between visits that require you to treat them on an as needed basis.  Think of this as your action plan or "what if" list.   Separating the top medications from the bottom group is fundamental to providing you adequate care going forward.    Please schedule a follow up office visit in 4 weeks, sooner if needed

## 2014-09-23 ENCOUNTER — Encounter: Payer: Self-pay | Admitting: Internal Medicine

## 2014-09-23 NOTE — Assessment & Plan Note (Signed)
-   Start noct 02 at 2lpm 2008 and on 03/04/09 desat @ > 185 ft so rec wear with activtiy > rm to rm  - 02/15/2011   Walked RA x one lap @ 185 stopped due to  desat > corrected on 2lpm - 02/26/2014  Walked 2lpm  2 laps @ 185 ft each stopped due to  Sob/ sats 88% at nl pace   rx as of 09/21/2014 = 2lpm 24/7 unless monitoring 02 sats with goal of > 90% at all times

## 2014-09-23 NOTE — Assessment & Plan Note (Signed)
-   PFTs 05/04/05 FEV1 36% ratio 28% with 29% improvement after bronchodilators DLCO 48%  - PFT's 03/26/08 30% ratio 34 with 14% improvement after bronchodilators DLC0 38 %  - Referred to rehab 12/19/2012 > completed March 2015  - changed to spiriva respimat 02/14/2013  - 02/14/2013 p extensive coaching HFA effectiveness =    90%  - started daily prednisone 06/19/13 > improved only a little> tapered off mid July 2015 > ok to change to prn prednisone 08/27/2013  - flared off nasonex and gerd rx 09/21/2014 > resumed  DDX of  difficult airways management all start with A and  include Adherence, Ace Inhibitors, Acid Reflux, Active Sinus Disease, Alpha 1 Antitripsin deficiency, Anxiety masquerading as Airways dz,  ABPA,  allergy(esp in young), Aspiration (esp in elderly), Adverse effects of meds,  Active smokers, A bunch of PE's (a small clot burden can't cause this syndrome unless there is already severe underlying pulm or vascular dz with poor reserve) plus two Bs  = Bronchiectasis and Beta blocker use..and one C= CHF  Adherence is always the initial "prime suspect" and is a multilayered concern that requires a "trust but verify" approach in every patient - starting with knowing how to use medications, especially inhalers, correctly, keeping up with refills and understanding the fundamental difference between maintenance and prns vs those medications only taken for a very short course and then stopped and not refilled.  - emphasize he should not change his maint rx without HCP input  ? Acid (or non-acid) GERD > always difficult to exclude as up to 75% of pts in some series report no assoc GI/ Heartburn symptoms> rec resume  max (24h)  acid suppression and diet restrictions/ reviewed     ? Active sinus dz/ rhinitis worse off flonase, best on dymista > resume   I had an extended discussion with the patient reviewing all relevant studies completed to date and  lasting 15 to 20 minutes of a 25 minute visit    Each  maintenance medication was reviewed in detail including most importantly the difference between maintenance and prns and under what circumstances the prns are to be triggered using an action plan format that is not reflected in the computer generated alphabetically organized AVS.    Please see instructions for details which were reviewed in writing and the patient given a copy highlighting the part that I personally wrote and discussed at today's ov.

## 2014-09-28 ENCOUNTER — Telehealth: Payer: Self-pay | Admitting: Internal Medicine

## 2014-09-28 MED ORDER — ALPRAZOLAM 0.25 MG PO TABS: 0.2500 mg | ORAL_TABLET | Freq: Three times a day (TID) | ORAL | Status: DC | PRN

## 2014-09-28 MED ORDER — OMEPRAZOLE 40 MG PO CPDR: 40.0000 mg | DELAYED_RELEASE_CAPSULE | Freq: Two times a day (BID) | ORAL | Status: DC

## 2014-09-28 NOTE — Telephone Encounter (Signed)
Spoke with pt. He needs refills on Omeprazole and Alprazolam. These have been sent in/called in. Nothing further was needed.

## 2014-10-03 ENCOUNTER — Other Ambulatory Visit: Payer: Self-pay | Admitting: Internal Medicine

## 2014-10-17 ENCOUNTER — Other Ambulatory Visit: Payer: Self-pay | Admitting: Internal Medicine

## 2014-10-19 ENCOUNTER — Encounter: Payer: Self-pay | Admitting: Internal Medicine

## 2014-10-19 ENCOUNTER — Ambulatory Visit (INDEPENDENT_AMBULATORY_CARE_PROVIDER_SITE_OTHER): Payer: Medicare Other | Admitting: Internal Medicine

## 2014-10-19 VITALS — BP 138/70 | HR 84 | Ht 69.0 in | Wt 150.6 lb

## 2014-10-19 DIAGNOSIS — J310 Chronic rhinitis: Secondary | ICD-10-CM

## 2014-10-19 DIAGNOSIS — J9611 Chronic respiratory failure with hypoxia: Secondary | ICD-10-CM | POA: Diagnosis not present

## 2014-10-19 DIAGNOSIS — I779 Disorder of arteries and arterioles, unspecified: Secondary | ICD-10-CM | POA: Diagnosis not present

## 2014-10-19 DIAGNOSIS — Z23 Encounter for immunization: Secondary | ICD-10-CM | POA: Diagnosis not present

## 2014-10-19 DIAGNOSIS — J449 Chronic obstructive pulmonary disease, unspecified: Secondary | ICD-10-CM | POA: Diagnosis not present

## 2014-10-19 NOTE — Patient Instructions (Signed)
Change the prednisone to 20 mg per day until better then 10 mg daily x 5 days then new floor of 5 mg daily   Change back to clariton as needed for nasal drainage  Use afrin x 5 days before each nasonex dose for any nasal flares  See Tammy NP in 4 weeks with all your medications, even over the counter meds, separated in two separate bags, the ones you take no matter what vs the ones you stop once you feel better and take only as needed when you feel you need them.   Tammy  will generate for you a new user friendly medication calendar that will put [place] all on the same page re: your medication use.     Without this process, it simply isn't possible to assure that we are providing  your outpatient care  with  the attention to detail we feel you deserve.   If we cannot assure that you're getting that kind of care,  then we cannot manage your problem effectively from this clinic.  Once you have seen Tammy and we are sure that we're all on the same page with your medication use she will arrange follow up with me.

## 2014-10-19 NOTE — Progress Notes (Signed)
Subjective:    Patient ID: [name], male    DOB: [birth year]   MRN: [id number]     Summary: Pulmonary/ f/u ov try astepro for pnds   Primary Provider/Referring Provider: [name]    Brief patient profile:  75   yowm last smoked 1983 with GOLD III COPD with some reversibility and 02 dep at hs and with activity    History of Present Illness   05/01/2011 f/u ov/Jentri Aye cc 50 ft outside, one aisle at [facility], whole store with 02. No cough and minimal need for saba Seen at [facility] rec change ipatropium neb  to atrovent > no change doe. rec ry tudorza one puff twice daily and stop atrovent and see how it effects your activity performance, if not benefit ok to change back to atrovent   10/04/2011 f/u ov/Ewin Rehberg did not bring med calendar cc  Loosing ground on tudorza so went to atrovent neb and seemed to help. rec Plan A = use automatically no matter what Symbicort followed by Atrovent each am and symbiocort again in 12 hours plus continue the atrovent though the day in between about every 4-6 hours Only use your albuterol (xopenex hfa -puffer-  is Plan B,  Nebulizer is plan C) .      12/18/2012 f/u ov/Tanijah Morais re: copd/ 02  at bedtime and with exertion  Chief Complaint  Patient presents with  . Follow-up    Increased sob at all times x1 month.  using O2 more frequently   actually no sob at rest or at hs, wears 02 with activity and when he does wear it the sats are fine even when he has to stop due to sob rec Plan A= automatic  symbiocort and tudorza twice daily and ok to use your 02 more consistently with activity that you know makes you short of breath Plan B = backup > xopenex 2 every 4 if needed if can't catch your breath Plan C = xopenex neb every 4 hours if plan B not working, always try plan B first  Please see patient coordinator before you leave today  to schedule rehab  02/14/2013 f/u ov/Haleemah Buckalew re:  Chief Complaint  Patient presents with  . Follow-up    Wants to make sure his medications  are correct.  Pt c/o increased SOB with exertion.  easily confused with details of care, only using hfa xopenex not the neb xopenex, times right before he goes to rehab and feels he's making slow progress.  >>changed Symbicort to [facility] and atrovent neb to spiriva respimat     04/25/2013 f/u ov/Kempton Milne re:  Chief Complaint  Patient presents with  . Acute Visit    Pt c/o increased DOE for the past 3 wks- worse for the past 4-5 days. He states that he is SOB with any exertion at all such as just raising up his arms. He also c/o nasal congestion for the past 3 months. He is using xopenex HFA at least twice per day.    incxrease doe assoc with prod of more mucus and nasal congestion  but better overnight and worse as day goes on, occ green mucus but mostly white No desats walking on 02 at rehab 2lpm rec Prednisone 10 mg take  4 each am x 2 days,   2 each am x 2 days,  1 each am x 2 days and stop  Follow med calendar    06/19/2013 f/u ov/Karver Fadden re: GOLD III COPD with some reversibility on dulera 200 2bid  and spriva respimat  Chief Complaint  Patient presents with  . Follow-up    Pt c/o increased DOE for the past month. He states he gets SOB with walking approx 50 ft.    using xopenex qid never neb Did get def transient  benefit from prednisone  >steroid challenge   07/17/2013 Follow up and Med review  Returns for follow up and med review . We reviewed  All his meds and organized them into a med calendar w/ pt education.  Last visit was started on prednisone 20 mg with taper down to 10 mg and hold. Patient says that his breathing has improved. We discussed steroid use, and he wishes to taper off of this due to potential side effects.  rec Follow med calendar closely and bring to each visit.  Decrease prednisone 10mg  1/2 daily for 1 week then 1/2 every other day for 1 week and then stop.  May use saline nasal spray and gel As needed  For congestion .    08/27/2013 f/u ov/Nijee Heatwole re: copd/ 02 dep  with sleep/ ex / on symbicort and spiriva  Chief Complaint  Patient presents with  . Follow-up    ROV : Pt c/o incr SOB and states that he has had a recent decrease in activity--does not feel like his stamina/endurance is where it needs to be.   Variable doe  seemed some better on prednisone - never sob at rest or sleeping  Wears 02 when sleeps at 2 and exercises at 2lpm on treadmill at 1.2 mph level, goes 5 min rest 1 min Has not tried pre treating with xopenex before he does the treadmill, never tried slow setting, never uses neb xopenex rec Try using your xopenex hfa  before planned walk on your treadmill to see what difference if any this makes Prevnar 13 given today Resume prednisone 10 mg x 2 daily when xopenx is not lasting 4 hours, then 1 daily x 5 days and stop      10/23/2013 f/u ov/Maaliyah Adolph re: copd/02 dep on symb/spiriva/ xopenex prn plus back up pred short course Chief Complaint  Patient presents with  . Follow-up    SOB   feels prednisone helped a lot with sob and less need for saba Able do treadmill x 15 min x 1 mph no grade no  wheeze overt sinus or hb symptoms.   rec Try using your xopenex hfa  before planned walk on your treadmill to see what difference if any this makes prevnar 13 given today Resume prednisone 10 mg x 2 daily when xopenx is not lasting 4 hours, then 1 daily x 5 days and stop   02/26/2014 f/u ov/Shaunte Weissinger re: GOLD III/ confused with action plan portion of med calendar  Chief Complaint  Patient presents with  . Follow-up    Pt c/o increased cough and SOB for the past 3 wks. His cough is worse in the am and is prod with white to light green sputum. He is using xopenex inhaler about 3-4 x per day and has not used neb.    treadmill x 15 min s stopping  x 1.2 mph flat/ on 02 2lpm with sats no lower than 93  Cough is day > noct, not flaring in am, no purulent sputum rec Goal is to keep sats over 90% with activity     06/01/2014 f/u ov/Jammy Stlouis re: COPD GOLD III/  symb/ spiriva respimat prednsione ? Dose / did not bring med calendar Chief Complaint  Patient  presents with  . Follow-up    Pt states his breathing is progressively worse since the last visit.    increased to 31 m on 02  1.2 mph, no tilt s stopping p 10 min to rest on 2lpm with adequate sats reported    some days  Prednisone 10 mg tapered but confused with instructions Drippy nose no better with flonase / clariton > has not tried 1st gen H1  Not much change using saba before ex  >>change claritin to chlortrimeton   06/15/2014 Follow up and Med review : COPD GOLD III/  Pt returns for follow up and med review  We reviewed all his meds and organized them into a med calendar with pt education  Appears to be taking meds correctly.  Last visit with copd flare , changed claritin to chlortabs but unable to tolerate during daytime Due to sleepiness.  Also to take 1/2 toprol for weakness, says he never had any episodes so went back on 1 whole tablet of toprol.  rec Follow med calendar closely and bring to each visit.  Please contact office for sooner follow up if symptoms do not improve or worsen or seek emergency care   Follow up Dr. [name]  In 3 months and As needed.    09/21/2014 f/u ov/Nadiya Pieratt re: GOLD III  Chief Complaint  Patient presents with  . Follow-up    Pt c/o increased SOB with activity, occ chest tightness, prod cough early mornings with white mucus. Constant sinus drainage and nose bleed on 09/19/14.     Doe better until 3 weeks prior to OV  Then worse sob assoc with nasal congestion off nasonex  - note also stopped omeprazole "I don't have reflux" - see Dg  Es below rec Restart dymista one twice daily  Continue prednisone 20 mg daily until better  then taper  See calendar for specific medication instructions   10/19/2014  f/u ov/Aideen Fenster re: GOLD III / maint rx symbicort/ spiriva Chief Complaint  Patient presents with  . Follow-up    Pt c/o fatigue and increased chest congestion  for the past several wks. He has am cough with white sputum. He is using xopenex 2 x per day on average.    on 10/8 started pred 20 and zpak as per med calendar action plan  gxt flat x 85mph x 15 min and zero grade on 2lpm with sats mid 90s at home   No obvious day to day or daytime variability or assoc purulent   mucus or cp or chest tightness, subjective wheeze or overt   hb symptoms. No unusual exp hx or h/o childhood pna/ asthma or knowledge of premature birth.  Sleeping ok without nocturnal  or early am exacerbation  of respiratory  c/o's or need for noct saba. Also denies any obvious fluctuation of symptoms with weather or environmental changes or other aggravating or alleviating factors except as outlined above   Current Medications, Allergies, Complete Past Medical History, Past Surgical History, Family History, and Social History were reviewed in [employer] record.  ROS  The following are not active complaints unless bolded sore throat, dysphagia, dental problems, itching, sneezing,  nasal congestion or excess/ purulent secretions, ear ache,   fever, chills, sweats, unintended wt loss, classically pleuritic or exertional cp, hemoptysis,  orthopnea pnd or leg swelling, presyncope, palpitations, abdominal pain, anorexia, nausea, vomiting, diarrhea  or change in bowel or bladder habits, change in stools or urine, dysuria,hematuria,  rash, arthralgias, visual  complaints, headache, numbness, weakness or ataxia or problems with walking or coordination,  change in mood/affect or memory.              Past Medical History:  CHRONIC RHINITIS (ICD-472.0)  - Sinus ct March 03, 2010 > thickening only, no acute changes  CHRONIC RESPIRATORY FAILURE (ICD-518.83)  - 02 rx since 2008  COPD UNSPECIFIED (ICD-496)....................................................................[name]  - PFTs 05/04/05 FEV1 36% ratio 28% with 29% improvement after bronchodilators DLCO 48%  - PFT's  03/26/08 30% ratio 34 with 14% improvement after bronchodilators DLC0 38 %  - Start noct 02 at 2lpm 2008 and on 03/04/09 desat @ > 185 ft so rec wear with activtiy > rm to rm  - [facility] June 30, 2008 100% > confirmed March 04, 2009  ISCHEMIC HEART DISEASE (ICD-414.9)  HYPERTENSION (ICD-401.9)  HYPERLIPIDEMIA (ICD-272.4)  SPN RUL by cxr August 19, 2009  COMPLEX MED REGIMEN-Meds reviewed with pt education and computerized med calendar 11/22/09 03/10/2011  , 10/15/2012  07/17/2013 , 12/09/2013 , 06/15/2014           Objective:   Physical Exam  amb wm nad with nl vitals / hopeless affect   wt 159 04/28/2010  > 10/14/2010 154  > 10/04/2011 150> 12/21/2011  150 > 149 01/29/2012 >154 04/05/2012 > 05/22/2012 148 > 09/30/2012  146 >>144> 10/15/2012 > 12/18/2012  145 > 148 02/14/2013 >148 03/14/2013 > 04/25/2013 148 > 06/19/2013 150 >>07/17/2013 > 08/27/2013 148 > 10/23/2013  148 >153 12/09/2013 > 02/26/2014  153 > 06/01/2014  152 > 157 06/15/2014 > 09/21/2014 146 > 10/19/2014 151   HEENT  mod nonspecific turbinate edema.  Oropharynx no thrush or excess pnd or cobblestoning.  No JVD or cervical adenopathy. Mild accessory muscle hypertrophy. Trachea midline, nl thryroid. Chest was hyperinflated by percussion with diminished breath sounds and moderate increased exp time without wheeze.   Regular rate and rhythm without murmur gallop or rub or increase P2 - 1 + pitting bilateral lower ext edema.   Abd: no hsm, nl excursion. Ext warm without cyanosis or clubbing.        I personally reviewed images and agree with radiology impression as follows:  DgEs  04/28/14 Mild gastroesophageal reflux. No hiatal hernia.        Assessment & Plan:

## 2014-10-20 DIAGNOSIS — M542 Cervicalgia: Secondary | ICD-10-CM | POA: Diagnosis not present

## 2014-10-20 DIAGNOSIS — F419 Anxiety disorder, unspecified: Secondary | ICD-10-CM | POA: Diagnosis not present

## 2014-10-20 DIAGNOSIS — R609 Edema, unspecified: Secondary | ICD-10-CM | POA: Diagnosis not present

## 2014-10-20 DIAGNOSIS — M545 Low back pain: Secondary | ICD-10-CM | POA: Diagnosis not present

## 2014-10-20 NOTE — Assessment & Plan Note (Addendum)
-   PFTs 05/04/05 FEV1 36% ratio 28% with 29% improvement after bronchodilators DLCO 48%  - PFT's 03/26/08 30% ratio 34 with 14% improvement after bronchodilators DLC0 38 %  - Referred to rehab 12/19/2012 > completed March 2015  - changed to spiriva respimat 02/14/2013  - 02/14/2013 p extensive coaching HFA effectiveness =    90%  - started daily prednisone 06/19/13 > improved only a little> tapered off mid July 2015 > ok to change to prn prednisone 08/27/2013  - flared off nasonex and gerd rx 09/21/2014 > resumed - changed pred to ceiling of 20 / floor of 5 mg daily as of 10/19/2014    The proper method of use, as well as anticipated side effects, of a metered-dose inhaler are discussed and demonstrated to the patient. Improved effectiveness after extensive coaching during this visit to a level of approximately  90%   Note on his last PFTs in 2010 he was actually approaching GOLD IV status and I believe he's there now. He does better while he is on prednisone and has no adverse effects clinically so I believe it would be appropriate at this point to maintain him on a small dose of prednisone using a floor of 5 mg.  I had an extended discussion with the patient reviewing all relevant studies completed to date and  lasting 15 to 20 minutes of a 25 minute visit    Each maintenance medication was reviewed in detail including most importantly the difference between maintenance and prns and under what circumstances the prns are to be triggered using an action plan format that is not reflected in the computer generated alphabetically organized AVS.    Please see instructions for details which were reviewed in writing and the patient given a copy highlighting the part that I personally wrote and discussed at today's ov.

## 2014-10-20 NOTE — Assessment & Plan Note (Signed)
-   Start noct 02 at 2lpm 2008 and on 03/04/09 desat @ > 185 ft so rec wear with activtiy > rm to rm  - 02/15/2011   Walked RA x one lap @ 185 stopped due to  desat > corrected on 2lpm - 02/26/2014  Walked 2lpm  2 laps @ 185 ft each stopped due to  Sob/ sats 88% at nl pace  - 10/19/2014   Walked RA  2 laps @ 185 ft each stopped due to  End of study, slow pace,  sob  But no  desat    rx as of 10/19/2014  = 2lpm 24/7 unless monitoring 02 sats with goal of > 90% at all times

## 2014-10-20 NOTE — Assessment & Plan Note (Signed)
Sinus CT 03/03/10 chronic changes only  - rec rechallenge with qnasal 12/21/11  - Trial of 1st gen h1 and hs h2 rec 05/23/2012  - Allergy profile 12/03/2012-negative-IgE 94.8     Food IgE profile-negative - rec rechallnge with 1st gen H1 06/01/14 > could not tolerate either 1st gen H1   nor Zyrtec so recommended go back to Claritin prn

## 2014-10-22 ENCOUNTER — Telehealth: Payer: Self-pay | Admitting: Internal Medicine

## 2014-10-22 NOTE — Telephone Encounter (Signed)
Called spoke with pt. He saw MW on 10/10. He finished the zpak yesterday. He c/o excessive nasal d/c (white phlem), breathing still labored, slight wheezing/Chest tx, slight prod cough (white phlem). Please advise MW thanks

## 2014-10-22 NOTE — Telephone Encounter (Signed)
LVM for pt to return call

## 2014-10-22 NOTE — Telephone Encounter (Signed)
Nothing else needed for now / just follow all the action plans on his med calendar as reviewed at last ov

## 2014-10-22 NOTE — Telephone Encounter (Signed)
[phone] calling back

## 2014-10-22 NOTE — Telephone Encounter (Signed)
Made pt aware of below. Nothing further needed 

## 2014-11-06 ENCOUNTER — Other Ambulatory Visit: Payer: Self-pay | Admitting: Internal Medicine

## 2014-11-10 ENCOUNTER — Encounter: Payer: Self-pay | Admitting: Physical Therapy

## 2014-11-10 ENCOUNTER — Ambulatory Visit: Payer: Medicare Other | Attending: Family Medicine | Admitting: Physical Therapy

## 2014-11-10 DIAGNOSIS — M542 Cervicalgia: Secondary | ICD-10-CM | POA: Diagnosis not present

## 2014-11-10 DIAGNOSIS — M545 Low back pain, unspecified: Secondary | ICD-10-CM

## 2014-11-10 NOTE — Therapy (Signed)
[facility] [address], [place], [place], [address] Phone: [phone]   Fax:  [phone]  Physical Therapy Evaluation  Patient Details  Name: [name] MRN: [id number] Date of Birth: 09/07/1929 Referring Provider: Dr. Myriam Jacobson  Encounter Date: 11/10/2014      PT End of Session - 11/10/14 1611    Visit Number 1   Number of Visits 10  Medicare   Date for PT Re-Evaluation 01/05/15   PT Start Time 7858   PT Stop Time 1612   PT Time Calculation (min) 42 min   Activity Tolerance Patient tolerated treatment well   Behavior During Therapy [facility] for tasks assessed/performed      Past Medical History  Diagnosis Date  . Hypertension   . S/P CABG x 4   . CAD (coronary artery disease)   . COPD (chronic obstructive pulmonary disease) ([place])   . Hyperlipidemia   . Chronic rhinitis   . Chronic respiratory failure ([place])   . Peripheral vascular disease ([place])   . Upper respiratory infection 08/2012  . Asthma   . Syncope and collapse   . Carotid artery occlusion     Past Surgical History  Procedure Laterality Date  . Coronary artery bypass graft  2003  . Cataract extraction  11-20-11  . Lithotripsy    . Prostate biopsy  Oct. 2014    There were no vitals filed for this visit.  Visit Diagnosis:  Right-sided low back pain without sciatica - Plan: PT plan of care cert/re-cert  Cervical pain - Plan: PT plan of care cert/re-cert      Subjective Assessment - 11/10/14 1537    Subjective Patient reports his chronic back and neck pain has been bothering him for 15 years.  Patient reports his back is worse than the neck. Patient reports the past 3 weeks the back and neck have been worse.    How long can you sit comfortably? 1 hour with good posture   How long can you stand comfortably? 20 min.    How long can you walk comfortably? every day walks 25 min on treadmill   Patient Stated Goals reduce pain in back  and  neck is less   Currently in Pain? Yes   Pain Score 4   can get to 9/10   Pain Location Back   Pain Orientation Right   Pain Descriptors / Indicators Dull;Sharp;Shooting;Aching   Pain Type Chronic pain   Pain Radiating Towards goes down right leg   Pain Onset More than a month ago   Pain Frequency Constant  difficult pain is intermittent   Aggravating Factors  getting up in the morning; bend over to place light object away   Pain Relieving Factors uses a thin firm exercise  mat to make back straight and knees to ches   Multiple Pain Sites Yes   Pain Score 5  wake up 8/10   Pain Location Neck   Pain Orientation Right   Pain Descriptors / Indicators Dull   Pain Type Chronic pain   Pain Onset More than a month ago   Pain Frequency Constant   Aggravating Factors  wake up with it, turning head side to side, tipping head downward   Pain Relieving Factors stretch neck   Effect of Pain on Daily Activities None            [facility] PT Assessment - 11/10/14 0001    Assessment   Medical Diagnosis neck and  lower back pain   Referring Provider Dr. Myriam Jacobson   Onset Date/Surgical Date 10/10/14   Hand Dominance Right   Precautions   Precautions Other (comment)   Balance Screen   Has the patient fallen in the past 6 months No   Has the patient had a decrease in activity level because of a fear of falling?  No   Prior Function   Level of Independence Independent   Cognition   Overall Cognitive Status Within Functional Limits for tasks assessed   Observation/Other Assessments   Focus on Therapeutic Outcomes (FOTO)  48% limitation CK  goal is 40% limitation CK   ROM / Strength   AROM / PROM / Strength AROM;PROM;Strength   AROM   Cervical Extension decreased by 25%   Cervical - Right Side Bend decreased by 50%   Cervical - Left Side Bend full   Cervical - Right Rotation decreased by 25%   Cervical - Left Rotation full   Lumbar Flexion full   Lumbar Extension decreased by 75%    Lumbar - Right Side Bend decreased by 50%   Lumbar - Left Side Bend decreased by 50%   Palpation   Palpation comment tenderness located on right cervical paraspinals and right superior iliac crest                           PT Education - 11/10/14 1610    Education provided Yes   Education Details cervical ROM; SKC, trunk rotation to left   Person(s) Educated Patient   Methods Explanation;Demonstration;Handout;Verbal cues   Comprehension Verbalized understanding;Returned demonstration          PT Short Term Goals - 11/10/14 1659    PT SHORT TERM GOAL #1   Title indpendent with initial HEP   Time 4   Period Weeks   Status New   PT SHORT TERM GOAL #2   Title cervical pain with looking downward decreased by 25%.   Time 4   Period Weeks   Status New   PT SHORT TERM GOAL #3   Title lumbar pain with daily activities decreased >/= 25%   Time 4   Period Weeks   Status New   PT SHORT TERM GOAL #4   Title understand correct body mechanics with daily tasks to decrease strain on spine   Time 4   Period Weeks   Status New           PT Long Term Goals - 11/10/14 1700    PT LONG TERM GOAL #1   Title independent with HEP and how to progress himself   Time 8   Period Weeks   Status New   PT LONG TERM GOAL #2   Title pain in cervical with looking downward or turning his head decreased >/= 50%   Time 8   Period Weeks   Status New   PT LONG TERM GOAL #3   Title lumbar pain with daily tasks decreased >/= 50%   Time 8   Period Weeks   Status New   PT LONG TERM GOAL #4   Title understand how to demonstrate correct posture    Time 8   Period Weeks   Status New               Plan - 11/10/14 1612    Clinical Impression Statement Patient is a 81 year old male with diagnosis of neck and lumbar pain for the  past 3 weeks.  FOTO score is 52% limitaiton. Patient reports cervical pain is 5/10 intermittent with turning his head and looking downward.   Patient reports lumbar pain is 4/10 and worse when he bends forward.  When patient wakes up in the morning he has increase cervical and lumbar pain.  Cervical extension and right rotation decreased by 25% and right sidebending decreased  by 50%.  Lumbar ROM for extension decreased by 25% and bilateral sidebending decreased by  50%. Tender to palpatiion on righ tcervical paraspinals and right superior iliac crest.  Patient is only able to stand for 20 min and sit 1 hour. Patient will benefit form physical therapy to reduce pain and restore function.    Pt will benefit from skilled therapeutic intervention in order to improve on the following deficits Decreased activity tolerance;Decreased strength;Decreased mobility;Pain;Increased muscle spasms;Decreased range of motion   Rehab Potential Good   Clinical Impairments Affecting Rehab Potential None   PT Frequency 2x / week   PT Duration 8 weeks   PT Treatment/Interventions Electrical Stimulation;Cryotherapy;Moist Heat;Therapeutic exercise;Therapeutic activities;Ultrasound;Neuromuscular re-education;Patient/family education;Manual techniques;Traction;Passive range of motion   PT Next Visit Plan modalities as needed, soft tissue work, core stabilzation exercises, body mechanics and posture   PT Home Exercise Plan body mechanics and posture   Recommended Other Services None   Consulted and Agree with Plan of Care Patient          G-Codes - 2014/11/11 1531    Functional Assessment Tool Used FOTO score is 48% limitation CK  goal is 40% limitation CK   Functional Limitation Changing and maintaining body position   Changing and Maintaining Body Position Current Status (E2683) At least 40 percent but less than 60 percent impaired, limited or restricted   Changing and Maintaining Body Position Goal Status (M1962) At least 40 percent but less than 60 percent impaired, limited or restricted       Problem List Patient Active Problem List   Diagnosis Date  Noted  . Right calf pain 06/18/2014  . Fatigue 10/28/2012  . CAD (coronary artery disease)   . Peripheral vascular disease ([place])   . Hypersomnia, idiopathic 11/18/2010  . Essential hypertension   . Obstructive sleep apnea   . Rhinitis, nonallergic, chronic 01/29/2007  . COPD  GOLD III 01/29/2007  . Chronic respiratory failure with hypoxia ([place]) 01/29/2007  . Hyperlipidemia     Lluvia Gwynne,PT 11-Nov-2014, 5:09 PM  [facility] [address] [place], [place], [address] Phone: [phone]   Fax:  [phone]  Name: [name] MRN: [id number] Date of Birth: November 08, 1929

## 2014-11-10 NOTE — Patient Instructions (Signed)
Knee-to-Chest Stretch: Unilateral    With hand behind right knee, pull knee in to chest until a comfortable stretch is felt in lower back and buttocks. Keep back relaxed. Hold 15____ seconds. Repeat _2___ times per set. Do _1___ sets per session. Do ____ sessions per day. 1 http://orth.exer.us/126   Copyright  VHI. All rights reserved.  Lower Trunk Rotation Stretch    Keeping back flat and feet together, rotate knees to left side only. Hold _15___ seconds. Repeat __2__ times per set. Do __1__ sets per session. Do __1__ sessions per day.  http://orth.exer.us/122   Copyright  VHI. All rights reserved.  AROM: Lateral Neck Flexion    Slowly tilt head toward one shoulder, then the other. Hold each position _5___ seconds. Repeat __2__ times per set. Do _1___ sets per session. Do ___2_ sessions per day.  http://orth.exer.us/296   Copyright  VHI. All rights reserved.  AROM: Neck Rotation    Turn head slowly to look over one shoulder, then the other. Hold each position __5__ seconds. Repeat __2__ times per set. Do ___1_ sets per session. Do __2__ sessions per day.  http://orth.exer.us/294   Copyright  VHI. All rights reserved.  [facility] [address] [place], [place] [address] Phone # [phone] Fax [phone]

## 2014-11-13 DIAGNOSIS — H353131 Nonexudative age-related macular degeneration, bilateral, early dry stage: Secondary | ICD-10-CM | POA: Diagnosis not present

## 2014-11-13 DIAGNOSIS — Z961 Presence of intraocular lens: Secondary | ICD-10-CM | POA: Diagnosis not present

## 2014-11-13 DIAGNOSIS — H35373 Puckering of macula, bilateral: Secondary | ICD-10-CM | POA: Diagnosis not present

## 2014-11-13 DIAGNOSIS — H26493 Other secondary cataract, bilateral: Secondary | ICD-10-CM | POA: Diagnosis not present

## 2014-11-17 ENCOUNTER — Ambulatory Visit: Payer: Medicare Other | Admitting: Physical Therapy

## 2014-11-17 DIAGNOSIS — M545 Low back pain, unspecified: Secondary | ICD-10-CM

## 2014-11-17 DIAGNOSIS — M542 Cervicalgia: Secondary | ICD-10-CM

## 2014-11-17 NOTE — Therapy (Signed)
[facility] [address] [place], [place], [address] Phone: [phone]   Fax:  [phone]  Physical Therapy Treatment  Patient Details  Name: [name] MRN: [id number] Date of Birth: 10/11/1929 Referring Provider: Dr. Myriam Jacobson  Encounter Date: 11/17/2014      PT End of Session - 11/17/14 1645    Visit Number 2   Number of Visits 10   Date for PT Re-Evaluation 01/05/15   PT Start Time 1618   PT Stop Time 1659   PT Time Calculation (min) 41 min   Activity Tolerance Patient tolerated treatment well   Behavior During Therapy [facility] for tasks assessed/performed      Past Medical History  Diagnosis Date  . Hypertension   . S/P CABG x 4   . CAD (coronary artery disease)   . COPD (chronic obstructive pulmonary disease) ([place])   . Hyperlipidemia   . Chronic rhinitis   . Chronic respiratory failure ([place])   . Peripheral vascular disease ([place])   . Upper respiratory infection 08/2012  . Asthma   . Syncope and collapse   . Carotid artery occlusion     Past Surgical History  Procedure Laterality Date  . Coronary artery bypass graft  2003  . Cataract extraction  11-20-11  . Lithotripsy    . Prostate biopsy  Oct. 2014    There were no vitals filed for this visit.  Visit Diagnosis:  Right-sided low back pain without sciatica  Cervical pain                       [facility] Adult PT Treatment/Exercise - 11/17/14 0001    Bed Mobility   Bed Mobility --  Pt educated in lifting techniques and sitting posture   Exercises   Exercises Lumbar;Neck   Neck Exercises: Supine   Cervical Rotation Both;Other (comment)  x3 with 20sec hold   Lumbar Exercises: Stretches   Active Hamstring Stretch 3 reps;20 seconds  in sitting and supine using strap   Single Knee to Chest Stretch 3 reps;20 seconds  each leg   Lower Trunk Rotation 3 reps;20 seconds  bolsters on each side due to flexibility limitations   Pelvic Tilt 5 reps  x 3 practiced in sitting -needs tactile and verbal cues                PT Education - 11/17/14 1642    Education provided Yes   Education Details Hsmstring stretch in supine, SKC, BKC, LTR,  lifting techniques   Person(s) Educated Patient   Methods Explanation;Demonstration;Handout   Comprehension Verbalized understanding;Returned demonstration          PT Short Term Goals - 11/17/14 1654    PT SHORT TERM GOAL #1   Title indpendent with initial HEP   Time 4   Period Weeks   Status On-going   PT SHORT TERM GOAL #2   Title cervical pain with looking downward decreased by 25%.   Time 4   Period Weeks   Status On-going   PT SHORT TERM GOAL #3   Title lumbar pain with daily activities decreased >/= 25%   Time 4   Period Weeks   Status On-going   PT SHORT TERM GOAL #4   Title understand correct body mechanics with daily tasks to decrease strain on spine   Time 4   Period Weeks   Status On-going  PT Long Term Goals - 11/17/14 1655    PT LONG TERM GOAL #1   Title independent with HEP and how to progress himself   Time 8   Period Weeks   Status On-going   PT LONG TERM GOAL #2   Title pain in cervical with looking downward or turning his head decreased >/= 50%   Time 8   Period Weeks   Status On-going   PT LONG TERM GOAL #3   Title lumbar pain with daily tasks decreased >/= 50%   Time 8   Period Weeks   Status On-going   PT LONG TERM GOAL #4   Title understand how to demonstrate correct posture    Time 8   Period Weeks   Status On-going               Plan - 11/17/14 1646    Clinical Impression Statement Patient is a 49 old male with neck and lumbar pain, and restrriction due to tight muscles. Pt was able to perform stretching exercises in supine with some modifications due to tightness and stiffness. Pt is on 2Ltr )2.   Pt will benefit from skilled therapeutic intervention in order to improve on the following  deficits Decreased activity tolerance;Decreased strength;Decreased mobility;Pain;Increased muscle spasms;Decreased range of motion   Rehab Potential Good   PT Frequency 2x / week   PT Duration 8 weeks   PT Treatment/Interventions Electrical Stimulation;Cryotherapy;Moist Heat;Therapeutic exercise;Therapeutic activities;Ultrasound;Neuromuscular re-education;Patient/family education;Manual techniques;Traction;Passive range of motion   PT Next Visit Plan modalities as needed, soft tissue work, core stabilzation exercises, add pelvic tilts in sitting and supine   PT Home Exercise Plan body mechanics and posture   Consulted and Agree with Plan of Care Patient        Problem List Patient Active Problem List   Diagnosis Date Noted  . Right calf pain 06/18/2014  . Fatigue 10/28/2012  . CAD (coronary artery disease)   . Peripheral vascular disease ([place])   . Hypersomnia, idiopathic 11/18/2010  . Essential hypertension   . Obstructive sleep apnea   . Rhinitis, nonallergic, chronic 01/29/2007  . COPD  GOLD III 01/29/2007  . Chronic respiratory failure with hypoxia ([place]) 01/29/2007  . Hyperlipidemia     NAUMANN-HOUEGNIFIO,Platon Arocho PTA 11/17/2014, 5:02 PM  [facility] [address], [place], [place], [address] Phone: [phone]   Fax:  [phone]  Name: [name] MRN: [id number] Date of Birth: 05-21-1929

## 2014-11-17 NOTE — Patient Instructions (Signed)
Double Knee to Chest (Flexion)   Gently pull both knees toward chest. Feel stretch in lower back or buttock area. Breathing deeply, Hold _20- 30_ seconds. Repeat __3__ times. Do _2___ sessions per day.  http://gt2.exer.us/227   Copyright  VHI. All rights reserved.  Lower Trunk Rotation Stretch   Keeping back flat and feet together, rotate knees to left side. Hold ___30_ seconds. Repeat __3_ times per set. . Do __2__ sessions per day.  http://orth.exer.us/122   Copyright  VHI. All rights reserved.    Hamstring Step 2   Left foot relaxed, knee straight, other leg bent, foot flat. Raise straight leg further upward to maximal range. Hold 20 to_30__ seconds. Relax leg completely down. Repeat 3  times. Repeat on other leg Have your opposite leg bend  -not shown in picture   One Knee   Slide object up one thigh, and hold close at waist level with both hands before standing up.   Copyright  VHI. All rights reserved.  Lifting Principles .Maintain proper posture and head alignment. .Slide object as close as possible before lifting. .Move obstacles out of the way. .Test before lifting; ask for help if too heavy. .Tighten stomach muscles without holding breath. .Use smooth movements; do not jerk. .Use legs to do the work, and pivot with feet. .Distribute the work load symmetrically and close to the center of trunk. .Push instead of pull whenever possible.  Copyright  VHI. All rights reserved.  Deep Squat   Squat and lift with both arms held against upper trunk. Tighten stomach muscles without holding breath. Use smooth movements to avoid jerking.  Copyright  VHI. All rights reserved.  Low Shelf   Squat down, and bring item close to lift.   Copyright  VHI. All rights reserved.

## 2014-11-18 ENCOUNTER — Telehealth: Payer: Self-pay | Admitting: Internal Medicine

## 2014-11-18 ENCOUNTER — Other Ambulatory Visit: Payer: Self-pay | Admitting: Internal Medicine

## 2014-11-18 NOTE — Telephone Encounter (Signed)
Patient calling to get refill on Zpak.   Checked chart and refill has been sent by Magda Paganini today. Patient notified that Zpak has been sent to pharmacy. Nothing further needed. Closing encounter

## 2014-11-19 ENCOUNTER — Encounter: Payer: Self-pay | Admitting: Adult Health

## 2014-11-19 ENCOUNTER — Ambulatory Visit (INDEPENDENT_AMBULATORY_CARE_PROVIDER_SITE_OTHER): Payer: Medicare Other | Admitting: Adult Health

## 2014-11-19 VITALS — BP 130/88 | HR 83 | Temp 97.6°F | Ht 69.0 in | Wt 148.0 lb

## 2014-11-19 DIAGNOSIS — J9611 Chronic respiratory failure with hypoxia: Secondary | ICD-10-CM

## 2014-11-19 DIAGNOSIS — J449 Chronic obstructive pulmonary disease, unspecified: Secondary | ICD-10-CM | POA: Diagnosis not present

## 2014-11-19 NOTE — Progress Notes (Signed)
Subjective:    Patient ID: [name], male    DOB: [birth year]   MRN: [id number]     Summary: Pulmonary/ f/u ov try astepro for pnds   Primary Provider/Referring Provider: [name]    Brief patient profile:  75   yowm last smoked 1983 with GOLD III COPD with some reversibility and 02 dep at hs and with activity    History of Present Illness   05/01/2011 f/u ov/Wert cc 50 ft outside, one aisle at [facility], whole store with 02. No cough and minimal need for saba Seen at [facility] rec change ipatropium neb  to atrovent > no change doe. rec ry tudorza one puff twice daily and stop atrovent and see how it effects your activity performance, if not benefit ok to change back to atrovent   10/04/2011 f/u ov/Wert did not bring med calendar cc  Loosing ground on tudorza so went to atrovent neb and seemed to help. rec Plan A = use automatically no matter what Symbicort followed by Atrovent each am and symbiocort again in 12 hours plus continue the atrovent though the day in between about every 4-6 hours Only use your albuterol (xopenex hfa -puffer-  is Plan B,  Nebulizer is plan C) .      12/18/2012 f/u ov/Wert re: copd/ 02  at bedtime and with exertion  Chief Complaint  Patient presents with  . Follow-up    Increased sob at all times x1 month.  using O2 more frequently   actually no sob at rest or at hs, wears 02 with activity and when he does wear it the sats are fine even when he has to stop due to sob rec Plan A= automatic  symbiocort and tudorza twice daily and ok to use your 02 more consistently with activity that you know makes you short of breath Plan B = backup > xopenex 2 every 4 if needed if can't catch your breath Plan C = xopenex neb every 4 hours if plan B not working, always try plan B first  Please see patient coordinator before you leave today  to schedule rehab  02/14/2013 f/u ov/Wert re:  Chief Complaint  Patient presents with  . Follow-up    Wants to make sure his medications  are correct.  Pt c/o increased SOB with exertion.  easily confused with details of care, only using hfa xopenex not the neb xopenex, times right before he goes to rehab and feels he's making slow progress.  >>changed Symbicort to [facility] and atrovent neb to spiriva respimat     04/25/2013 f/u ov/Wert re:  Chief Complaint  Patient presents with  . Acute Visit    Pt c/o increased DOE for the past 3 wks- worse for the past 4-5 days. He states that he is SOB with any exertion at all such as just raising up his arms. He also c/o nasal congestion for the past 3 months. He is using xopenex HFA at least twice per day.    incxrease doe assoc with prod of more mucus and nasal congestion  but better overnight and worse as day goes on, occ green mucus but mostly white No desats walking on 02 at rehab 2lpm rec Prednisone 10 mg take  4 each am x 2 days,   2 each am x 2 days,  1 each am x 2 days and stop  Follow med calendar    06/19/2013 f/u ov/Wert re: GOLD III COPD with some reversibility on dulera 200 2bid  and spriva respimat  Chief Complaint  Patient presents with  . Follow-up    Pt c/o increased DOE for the past month. He states he gets SOB with walking approx 50 ft.    using xopenex qid never neb Did get def transient  benefit from prednisone  >steroid challenge   07/17/2013 Follow up and Med review  Returns for follow up and med review . We reviewed  All his meds and organized them into a med calendar w/ pt education.  Last visit was started on prednisone 20 mg with taper down to 10 mg and hold. Patient says that his breathing has improved. We discussed steroid use, and he wishes to taper off of this due to potential side effects.  rec Follow med calendar closely and bring to each visit.  Decrease prednisone 10mg  1/2 daily for 1 week then 1/2 every other day for 1 week and then stop.  May use saline nasal spray and gel As needed  For congestion .    08/27/2013 f/u ov/Wert re: copd/ 02 dep  with sleep/ ex / on symbicort and spiriva  Chief Complaint  Patient presents with  . Follow-up    ROV : Pt c/o incr SOB and states that he has had a recent decrease in activity--does not feel like his stamina/endurance is where it needs to be.   Variable doe  seemed some better on prednisone - never sob at rest or sleeping  Wears 02 when sleeps at 2 and exercises at 2lpm on treadmill at 1.2 mph level, goes 5 min rest 1 min Has not tried pre treating with xopenex before he does the treadmill, never tried slow setting, never uses neb xopenex rec Try using your xopenex hfa  before planned walk on your treadmill to see what difference if any this makes Prevnar 13 given today Resume prednisone 10 mg x 2 daily when xopenx is not lasting 4 hours, then 1 daily x 5 days and stop      10/23/2013 f/u ov/Wert re: copd/02 dep on symb/spiriva/ xopenex prn plus back up pred short course Chief Complaint  Patient presents with  . Follow-up    SOB   feels prednisone helped a lot with sob and less need for saba Able do treadmill x 15 min x 1 mph no grade no  wheeze overt sinus or hb symptoms.   rec Try using your xopenex hfa  before planned walk on your treadmill to see what difference if any this makes prevnar 13 given today Resume prednisone 10 mg x 2 daily when xopenx is not lasting 4 hours, then 1 daily x 5 days and stop   02/26/2014 f/u ov/Wert re: GOLD III/ confused with action plan portion of med calendar  Chief Complaint  Patient presents with  . Follow-up    Pt c/o increased cough and SOB for the past 3 wks. His cough is worse in the am and is prod with white to light green sputum. He is using xopenex inhaler about 3-4 x per day and has not used neb.    treadmill x 15 min s stopping  x 1.2 mph flat/ on 02 2lpm with sats no lower than 93  Cough is day > noct, not flaring in am, no purulent sputum rec Goal is to keep sats over 90% with activity     06/01/2014 f/u ov/Wert re: COPD GOLD III/  symb/ spiriva respimat prednsione ? Dose / did not bring med calendar Chief Complaint  Patient  presents with  . Follow-up    Pt states his breathing is progressively worse since the last visit.    increased to 32 m on 02  1.2 mph, no tilt s stopping p 10 min to rest on 2lpm with adequate sats reported    some days  Prednisone 10 mg tapered but confused with instructions Drippy nose no better with flonase / clariton > has not tried 1st gen H1  Not much change using saba before ex  >>change claritin to chlortrimeton   06/15/2014 Follow up and Med review : COPD GOLD III/  Pt returns for follow up and med review  We reviewed all his meds and organized them into a med calendar with pt education  Appears to be taking meds correctly.  Last visit with copd flare , changed claritin to chlortabs but unable to tolerate during daytime Due to sleepiness.  Also to take 1/2 toprol for weakness, says he never had any episodes so went back on 1 whole tablet of toprol.  rec Follow med calendar closely and bring to each visit.  Please contact office for sooner follow up if symptoms do not improve or worsen or seek emergency care   Follow up Dr. [name]  In 3 months and As needed.    09/21/2014 f/u ov/Wert re: GOLD III  Chief Complaint  Patient presents with  . Follow-up    Pt c/o increased SOB with activity, occ chest tightness, prod cough early mornings with white mucus. Constant sinus drainage and nose bleed on 09/19/14.     Doe better until 3 weeks prior to OV  Then worse sob assoc with nasal congestion off nasonex  - note also stopped omeprazole "I don't have reflux" - see Dg  Es below rec Restart dymista one twice daily  Continue prednisone 20 mg daily until better  then taper  See calendar for specific medication instructions   10/19/2014  f/u ov/Wert re: GOLD III / maint rx symbicort/ spiriva Chief Complaint  Patient presents with  . Follow-up    Pt c/o fatigue and increased chest congestion  for the past several wks. He has am cough with white sputum. He is using xopenex 2 x per day on average.    on 10/8 started pred 20 and zpak as per med calendar action plan  gxt flat x 19mph x 15 min and zero grade on 2lpm with sats mid 90s at home  >decrease pred 5mg    11/19/2014 Follow up : COPD GOLD III  Returns for follow up .  Says he had a flare 4 days ago with increased SOB, occasional dry cough, chest tightness. Sinus pressure and drainage. Denies fever, wheezing, nausea or vomiting. Started on Zpack and prednisone 20mg  yesterday .  Feels better today than yesterday.  He denies chest pain or hemoptysis.  PVX, Prevnar , and Flu are utd.  Reviewed meds with pt education and organized them into a med calendar  Pt denies chest pain, orthopnea, hemoptysis or fever.       Current Medications, Allergies, Complete Past Medical History, Past Surgical History, Family History, and Social History were reviewed in [employer] record.  ROS  The following are not active complaints unless bolded sore throat, dysphagia, dental problems, itching, sneezing,  nasal congestion or excess/ purulent secretions, ear ache,   fever, chills, sweats, unintended wt loss, classically pleuritic or exertional cp, hemoptysis,  orthopnea pnd or leg swelling, presyncope, palpitations, abdominal pain, anorexia, nausea, vomiting, diarrhea  or  change in bowel or bladder habits, change in stools or urine, dysuria,hematuria,  rash, arthralgias, visual complaints, headache, numbness, weakness or ataxia or problems with walking or coordination,  change in mood/affect or memory.              Past Medical History:  CHRONIC RHINITIS (ICD-472.0)  - Sinus ct March 03, 2010 > thickening only, no acute changes  CHRONIC RESPIRATORY FAILURE (ICD-518.83)  - 02 rx since 2008  COPD UNSPECIFIED (ICD-496)....................................................................[name]  - PFTs 05/04/05 FEV1 36% ratio  28% with 29% improvement after bronchodilators DLCO 48%  - PFT's 03/26/08 30% ratio 34 with 14% improvement after bronchodilators DLC0 38 %  - Start noct 02 at 2lpm 2008 and on 03/04/09 desat @ > 185 ft so rec wear with activtiy > rm to rm  - [facility] June 30, 2008 100% > confirmed March 04, 2009  ISCHEMIC HEART DISEASE (ICD-414.9)  HYPERTENSION (ICD-401.9)  HYPERLIPIDEMIA (ICD-272.4)  SPN RUL by cxr August 19, 2009  COMPLEX MED REGIMEN-Meds reviewed with pt education and computerized med calendar 11/22/09 03/10/2011  , 10/15/2012  07/17/2013 , 12/09/2013 , 06/15/2014 , 11/19/14           Objective:   Physical Exam  amb wm nad with nl vitals / hopeless affect   wt 159 04/28/2010  > 10/14/2010 154  > 10/04/2011 150> 12/21/2011  150 > 149 01/29/2012 >154 04/05/2012 > 05/22/2012 148 > 09/30/2012  146 >>144> 10/15/2012 > 12/18/2012  145 > 148 02/14/2013 >148 03/14/2013 > 04/25/2013 148 > 06/19/2013 150 >>07/17/2013 > 08/27/2013 148 > 10/23/2013  148 >153 12/09/2013 > 02/26/2014  153 > 06/01/2014  152 > 157 06/15/2014 > 09/21/2014 146 > 10/19/2014 151 >>148 11/19/14  HEENT  mod nonspecific turbinate edema.  Oropharynx no thrush or excess pnd or cobblestoning.  No JVD or cervical adenopathy. Mild accessory muscle hypertrophy. Trachea midline, nl thryroid. Chest was hyperinflated by percussion with diminished breath sounds and moderate increased exp time without wheeze.   Regular rate and rhythm without murmur gallop or rub or increase P2 - 1 + pitting bilateral lower ext edema.   Abd: no hsm, nl excursion. Ext warm without cyanosis or clubbing.         DgEs  04/28/14 Mild gastroesophageal reflux. No hiatal hernia.        Assessment & Plan:

## 2014-11-19 NOTE — Patient Instructions (Signed)
Follow med calendar closely and bring to each visit.  Finish Zpack as directed.  Taper prednisone and hold at 5mg  daily  Please contact office for sooner follow up if symptoms do not improve or worsen or seek emergency care   Follow up Dr. [name]  In 3 months and As needed.

## 2014-11-20 NOTE — Assessment & Plan Note (Signed)
Flare  Patient's medications were reviewed today and patient education was given. Computerized medication calendar was adjusted/completed   Plan  Follow med calendar closely and bring to each visit.  Finish Zpack as directed.  Taper prednisone and hold at 5mg  daily  Please contact office for sooner follow up if symptoms do not improve or worsen or seek emergency care   Follow up Dr. [name]  In 3 months and As needed.

## 2014-11-20 NOTE — Assessment & Plan Note (Signed)
Cont on O2  O2

## 2014-11-20 NOTE — Progress Notes (Signed)
Chart and office note reviewed in detail  > agree with a/p as outlined    

## 2014-11-24 ENCOUNTER — Telehealth: Payer: Self-pay | Admitting: Internal Medicine

## 2014-11-24 MED ORDER — LEVOFLOXACIN 500 MG PO TABS: 500.0000 mg | ORAL_TABLET | Freq: Every day | ORAL | Status: DC

## 2014-11-24 NOTE — Telephone Encounter (Signed)
levaquin 500 mg qd x 7 days   Leave prednisone 20 mg daily until better as per prev instructions

## 2014-11-24 NOTE — Addendum Note (Signed)
Addended by: Osa Craver on: 11/24/2014 12:29 PM   Modules accepted: Orders, Medications

## 2014-11-24 NOTE — Telephone Encounter (Signed)
Spoke with pt, aware of recs.  abx sent to pharmacy.  Nothing further needed.

## 2014-11-24 NOTE — Telephone Encounter (Signed)
Called and spoke with patien t  Pt states that he saw Tammy Parrett on 11-19-14 for flare of COPD and was given antibiotic and prednisone.  Pt was instructed at the visit to call office back if not feeling better.  Pt states that he feels that he has become worse.  Pt c/o nasal and chest congestion, SOB with exertion, PND, and wheezing.  Denies coughing, fever, n/v, diarrhea  Pt would like rec of MW of what he should do.   Dr [name], please advise. Thanks

## 2014-11-25 ENCOUNTER — Telehealth: Payer: Self-pay | Admitting: Internal Medicine

## 2014-11-25 NOTE — Telephone Encounter (Signed)
Spoke with the pt He states took 1 dose of levaquin 500 mg yesterday and approx 2 hours later developed redness and itching on the palms of his hands  He states that he did not take any more med and symptoms resolved He is not having any swelling, SOB or other symptoms  He is unsure if we need to replace levaquin with a different abx, b/c he is coughing less today, but nasal drainage still pretty bad  Please advise thanks

## 2014-11-25 NOTE — Telephone Encounter (Signed)
Sorry to hear about the reaction - no more abx indicated >> remember For drainage / throat tickle try take CHLORPHENIRAMINE  4 mg - take one every 4 hours as needed - available over the counter- may cause drowsiness so best to use when can afford to be sleepy

## 2014-11-25 NOTE — Telephone Encounter (Signed)
Called and spoke with pt and informed of MW rec Pt voiced understanding of rec  Nothing further is needed at this time

## 2014-11-26 ENCOUNTER — Other Ambulatory Visit: Payer: Self-pay | Admitting: Interventional Cardiology

## 2014-11-26 ENCOUNTER — Encounter: Payer: Medicare Other | Admitting: Physical Therapy

## 2014-11-30 ENCOUNTER — Encounter: Payer: Medicare Other | Admitting: Physical Therapy

## 2014-12-01 DIAGNOSIS — L570 Actinic keratosis: Secondary | ICD-10-CM | POA: Diagnosis not present

## 2014-12-01 DIAGNOSIS — D239 Other benign neoplasm of skin, unspecified: Secondary | ICD-10-CM | POA: Diagnosis not present

## 2014-12-01 DIAGNOSIS — D485 Neoplasm of uncertain behavior of skin: Secondary | ICD-10-CM | POA: Diagnosis not present

## 2014-12-01 DIAGNOSIS — L728 Other follicular cysts of the skin and subcutaneous tissue: Secondary | ICD-10-CM | POA: Diagnosis not present

## 2014-12-07 ENCOUNTER — Encounter: Payer: Self-pay | Admitting: Physical Therapy

## 2014-12-07 ENCOUNTER — Ambulatory Visit: Payer: Medicare Other | Admitting: Physical Therapy

## 2014-12-07 DIAGNOSIS — M545 Low back pain, unspecified: Secondary | ICD-10-CM

## 2014-12-07 DIAGNOSIS — M542 Cervicalgia: Secondary | ICD-10-CM | POA: Diagnosis not present

## 2014-12-07 NOTE — Therapy (Signed)
[facility] [address] [place], [place], [address] Phone: [phone]   Fax:  [phone]  Physical Therapy Treatment  Patient Details  Name: STILES ALPERS MRN: [id number] Date of Birth: [birth year] Referring Provider: Dr. Myriam Jacobson  Encounter Date: 12/07/2014      PT End of Session - 12/07/14 1556    Visit Number 3   Number of Visits 10   Date for PT Re-Evaluation 01/05/15   PT Start Time 1529   PT Stop Time 1615   PT Time Calculation (min) 46 min   Activity Tolerance Patient tolerated treatment well   Behavior During Therapy [facility] for tasks assessed/performed      Past Medical History  Diagnosis Date  . Hypertension   . S/P CABG x 4   . CAD (coronary artery disease)   . COPD (chronic obstructive pulmonary disease) ([place])   . Hyperlipidemia   . Chronic rhinitis   . Chronic respiratory failure ([place])   . Peripheral vascular disease ([place])   . Upper respiratory infection 08/2012  . Asthma   . Syncope and collapse   . Carotid artery occlusion     Past Surgical History  Procedure Laterality Date  . Coronary artery bypass graft  2003  . Cataract extraction  11-20-11  . Lithotripsy    . Prostate biopsy  Oct. 2014    There were no vitals filed for this visit.  Visit Diagnosis:  Right-sided low back pain without sciatica  Cervical pain      Subjective Assessment - 12/07/14 1540    Subjective Patient reports pain is today located in right low back.   Pertinent History Pt with chronic back and neck pain since 15 years.    Currently in Pain? Yes   Pain Score 4   up to 7-8/10 in the early morning or with some activities   Pain Location Back   Pain Orientation Right   Pain Descriptors / Indicators Dull;Sharp;Shooting;Aching   Pain Type Chronic pain   Pain Onset More than a month ago   Pain Frequency Constant   Multiple Pain Sites No                         [facility] Adult PT  Treatment/Exercise - 12/07/14 0001    Bed Mobility   Bed Mobility --  Pt is on 2 Ltr o2   Exercises   Exercises Lumbar;Neck   Neck Exercises: Supine   Cervical Rotation Both;Other (comment)  x 3 with 20sec hold in supine   Lumbar Exercises: Stretches   Active Hamstring Stretch 3 reps;20 seconds  in sitting and supine with strap   Single Knee to Chest Stretch 3 reps;20 seconds  each leg   Lower Trunk Rotation 3 reps;20 seconds  pt improved today, does not need bolster on the side   Pelvic Tilt --  in supine 2 x 10                  PT Short Term Goals - 12/07/14 1603    PT SHORT TERM GOAL #1   Title indpendent with initial HEP   Time 4   Period Weeks   Status On-going   PT SHORT TERM GOAL #2   Title cervical pain with looking downward decreased by 25%.   Time 4   Period Weeks   Status Achieved   PT SHORT TERM GOAL #3   Title lumbar pain with daily activities  decreased >/= 25%   Time 4   Period Weeks   Status On-going   PT SHORT TERM GOAL #4   Title understand correct body mechanics with daily tasks to decrease strain on spine   Time 4   Period Weeks   Status On-going           PT Long Term Goals - 11/17/14 1655    PT LONG TERM GOAL #1   Title independent with HEP and how to progress himself   Time 8   Period Weeks   Status On-going   PT LONG TERM GOAL #2   Title pain in cervical with looking downward or turning his head decreased >/= 50%   Time 8   Period Weeks   Status On-going   PT LONG TERM GOAL #3   Title lumbar pain with daily tasks decreased >/= 50%   Time 8   Period Weeks   Status On-going   PT LONG TERM GOAL #4   Title understand how to demonstrate correct posture    Time 8   Period Weeks   Status On-going               Plan - 12/07/14 1558    Clinical Impression Statement Patient is a 81 year old with neck and lumbar pain, and restriction due to tight muscles. Pt is on 2 Ltr. O2. Pt needs verbal cues to improve  technique.    Pt will benefit from skilled therapeutic intervention in order to improve on the following deficits Decreased activity tolerance;Decreased strength;Decreased mobility;Pain;Increased muscle spasms;Decreased range of motion   Rehab Potential Good   PT Frequency 2x / week   PT Duration 8 weeks   PT Treatment/Interventions Electrical Stimulation;Cryotherapy;Moist Heat;Therapeutic exercise;Therapeutic activities;Ultrasound;Neuromuscular re-education;Patient/family education;Manual techniques;Traction;Passive range of motion   PT Next Visit Plan modalities as needed, soft tissue work, core stabilzation exercises, add pelvic tilts in sitting and supine   PT Home Exercise Plan body mechanics and posture   Consulted and Agree with Plan of Care Patient        Problem List Patient Active Problem List   Diagnosis Date Noted  . Right calf pain 06/18/2014  . Fatigue 10/28/2012  . CAD (coronary artery disease)   . Peripheral vascular disease ([place])   . Hypersomnia, idiopathic 11/18/2010  . Essential hypertension   . Obstructive sleep apnea   . Rhinitis, nonallergic, chronic 01/29/2007  . COPD  GOLD III 01/29/2007  . Chronic respiratory failure with hypoxia ([place]) 01/29/2007  . Hyperlipidemia     NAUMANN-HOUEGNIFIO,Sharvil Hoey PTA 12/07/2014, 5:08 PM  [facility] [address], [place], [place], [address] Phone: [phone]   Fax:  [phone]  Name: CORSON AYBAR MRN: [id number] Date of Birth: Dec 01, 1929

## 2014-12-10 ENCOUNTER — Encounter: Payer: Self-pay | Admitting: Physical Therapy

## 2014-12-10 ENCOUNTER — Ambulatory Visit: Payer: Medicare Other | Attending: Family Medicine | Admitting: Physical Therapy

## 2014-12-10 DIAGNOSIS — M542 Cervicalgia: Secondary | ICD-10-CM

## 2014-12-10 DIAGNOSIS — M545 Low back pain, unspecified: Secondary | ICD-10-CM

## 2014-12-10 NOTE — Therapy (Signed)
[facility] [address] [place], [place], [address] Phone: [phone]   Fax:  [phone]  Physical Therapy Treatment  Patient Details  Name: [name] MRN: [id number] Date of Birth: Jul 04, 1929 Referring Provider: Dr. Myriam Jacobson  Encounter Date: 12/10/2014      PT End of Session - 12/10/14 1559    Visit Number 4   Number of Visits 10   Date for PT Re-Evaluation 01/05/15   PT Start Time 1542  pt arrived late   PT Stop Time 1615   PT Time Calculation (min) 33 min   Activity Tolerance Patient tolerated treatment well   Behavior During Therapy [facility] for tasks assessed/performed      Past Medical History  Diagnosis Date  . Hypertension   . S/P CABG x 4   . CAD (coronary artery disease)   . COPD (chronic obstructive pulmonary disease) ([place])   . Hyperlipidemia   . Chronic rhinitis   . Chronic respiratory failure ([place])   . Peripheral vascular disease ([place])   . Upper respiratory infection 08/2012  . Asthma   . Syncope and collapse   . Carotid artery occlusion     Past Surgical History  Procedure Laterality Date  . Coronary artery bypass graft  2003  . Cataract extraction  11-20-11  . Lithotripsy    . Prostate biopsy  Oct. 2014    There were no vitals filed for this visit.  Visit Diagnosis:  Right-sided low back pain without sciatica  Cervical pain      Subjective Assessment - 12/10/14 1545    Subjective pt reports compliance with initial HEP, and the stretches in the bed in the morning seems to help   Currently in Pain? Yes   Pain Score 5    Pain Location Back   Pain Orientation Right   Pain Descriptors / Indicators Dull;Sharp;Shooting;Aching   Pain Type Chronic pain   Pain Onset More than a month ago   Multiple Pain Sites No                         [facility] Adult PT Treatment/Exercise - 12/10/14 0001    Bed Mobility   Bed Mobility --  Pt is on 2Ltr 02   Exercises   Exercises Lumbar;Neck   Neck Exercises: Supine   Cervical Rotation Both;Other (comment)  x 3 with 20sec hold in supine   Lumbar Exercises: Stretches   Active Hamstring Stretch 3 reps;20 seconds  in sitting   Single Knee to Chest Stretch 3 reps;20 seconds  each leg   Double Knee to Chest Stretch 3 reps;20 seconds   Lower Trunk Rotation 3 reps;20 seconds   Pelvic Tilt Other (comment)  in supine   Modalities   Modalities Moist Heat   Moist Heat Therapy   Number Minutes Moist Heat 15 Minutes   Moist Heat Location Lumbar Spine;Cervical  with 3 pillows in hooklying                  PT Short Term Goals - 12/07/14 1603    PT SHORT TERM GOAL #1   Title indpendent with initial HEP   Time 4   Period Weeks   Status On-going   PT SHORT TERM GOAL #2   Title cervical pain with looking downward decreased by 25%.   Time 4   Period Weeks   Status Achieved   PT SHORT TERM GOAL #3   Title  lumbar pain with daily activities decreased >/= 25%   Time 4   Period Weeks   Status On-going   PT SHORT TERM GOAL #4   Title understand correct body mechanics with daily tasks to decrease strain on spine   Time 4   Period Weeks   Status On-going           PT Long Term Goals - 11/17/14 1655    PT LONG TERM GOAL #1   Title independent with HEP and how to progress himself   Time 8   Period Weeks   Status On-going   PT LONG TERM GOAL #2   Title pain in cervical with looking downward or turning his head decreased >/= 50%   Time 8   Period Weeks   Status On-going   PT LONG TERM GOAL #3   Title lumbar pain with daily tasks decreased >/= 50%   Time 8   Period Weeks   Status On-going   PT LONG TERM GOAL #4   Title understand how to demonstrate correct posture    Time 8   Period Weeks   Status On-going               Plan - 12/10/14 1601    Clinical Impression Statement Patient is a 81 y. o. with neck and lumbar pain, and restrictions due to tight muscles. Pt is on 2Ltr  O2. Pt will continue to benefit from skilled Pt   Pt will benefit from skilled therapeutic intervention in order to improve on the following deficits Decreased activity tolerance;Decreased strength;Decreased mobility;Pain;Increased muscle spasms;Decreased range of motion   Rehab Potential Good   PT Frequency 2x / week   PT Duration 8 weeks   PT Treatment/Interventions Electrical Stimulation;Cryotherapy;Moist Heat;Therapeutic exercise;Therapeutic activities;Ultrasound;Neuromuscular re-education;Patient/family education;Manual techniques;Traction;Passive range of motion   PT Next Visit Plan modalities as needed, soft tissue work, core stabilzation exercises, add pelvic tilts in sitting and supine   PT Home Exercise Plan body mechanics and posture   Consulted and Agree with Plan of Care Patient        Problem List Patient Active Problem List   Diagnosis Date Noted  . Right calf pain 06/18/2014  . Fatigue 10/28/2012  . CAD (coronary artery disease)   . Peripheral vascular disease ([place])   . Hypersomnia, idiopathic 11/18/2010  . Essential hypertension   . Obstructive sleep apnea   . Rhinitis, nonallergic, chronic 01/29/2007  . COPD  GOLD III 01/29/2007  . Chronic respiratory failure with hypoxia ([place]) 01/29/2007  . Hyperlipidemia     NAUMANN-HOUEGNIFIO,Zuha Dejonge PTA 12/10/2014, 5:03 PM  [facility] [address] [place], [place], [address] Phone: [phone]   Fax:  [phone]  Name: [name] MRN: [id number] Date of Birth: 10-05-1929

## 2014-12-13 ENCOUNTER — Other Ambulatory Visit: Payer: Self-pay | Admitting: Interventional Cardiology

## 2014-12-14 ENCOUNTER — Encounter: Payer: Self-pay | Admitting: Physical Therapy

## 2014-12-14 ENCOUNTER — Ambulatory Visit: Payer: Medicare Other | Admitting: Physical Therapy

## 2014-12-14 DIAGNOSIS — M545 Low back pain, unspecified: Secondary | ICD-10-CM

## 2014-12-14 DIAGNOSIS — M542 Cervicalgia: Secondary | ICD-10-CM

## 2014-12-14 NOTE — Therapy (Signed)
[facility] [address] [place], [place], [address] Phone: [phone]   Fax:  [phone]  Physical Therapy Treatment  Patient Details  Name: [name] MRN: [id number] Date of Birth: [birth year] Referring Provider: Dr. Myriam Jacobson  Encounter Date: 12/14/2014      PT End of Session - 12/14/14 1555    Visit Number 5   Number of Visits 10   Date for PT Re-Evaluation 01/05/15   PT Start Time 1544  pt arrived late   PT Stop Time 1630   PT Time Calculation (min) 46 min   Activity Tolerance Patient tolerated treatment well   Behavior During Therapy [facility] for tasks assessed/performed      Past Medical History  Diagnosis Date  . Hypertension   . S/P CABG x 4   . CAD (coronary artery disease)   . COPD (chronic obstructive pulmonary disease) ([place])   . Hyperlipidemia   . Chronic rhinitis   . Chronic respiratory failure ([place])   . Peripheral vascular disease ([place])   . Upper respiratory infection 08/2012  . Asthma   . Syncope and collapse   . Carotid artery occlusion     Past Surgical History  Procedure Laterality Date  . Coronary artery bypass graft  2003  . Cataract extraction  11-20-11  . Lithotripsy    . Prostate biopsy  Oct. 2014    There were no vitals filed for this visit.  Visit Diagnosis:  Right-sided low back pain without sciatica  Cervical pain      Subjective Assessment - 12/14/14 1549    Subjective Pt reports continues to perform stretches in the morning before going outof bed, what helps to reduce the stiffness   Currently in Pain? Yes   Pain Score 6    Pain Location Back   Pain Orientation Right   Pain Descriptors / Indicators Dull;Aching;Sharp;Shooting   Pain Onset More than a month ago   Pain Frequency Constant   Multiple Pain Sites Yes   Pain Score 5   Pain Location Neck   Pain Orientation Right   Pain Descriptors / Indicators Dull   Pain Type Chronic pain   Pain Onset More  than a month ago   Pain Frequency Constant                         [facility] Adult PT Treatment/Exercise - 12/14/14 0001    Bed Mobility   Bed Mobility --  Pt is on 2ltr O2   Exercises   Exercises Lumbar;Neck   Neck Exercises: Supine   Cervical Rotation Both;Other (comment)  each side x 3 with 20 sec hold in sitting & supine   Lumbar Exercises: Stretches   Active Hamstring Stretch 3 reps;20 seconds  in sitting   Single Knee to Chest Stretch 3 reps;20 seconds  each leg   Double Knee to Chest Stretch 3 reps;20 seconds   Lower Trunk Rotation 3 reps;20 seconds   Pelvic Tilt Other (comment)  in sitting   Lumbar Exercises: Supine   Bridge 10 reps;3 seconds   Modalities   Modalities Moist Heat   Moist Heat Therapy   Number Minutes Moist Heat 15 Minutes   Moist Heat Location Lumbar Spine;Cervical                  PT Short Term Goals - 12/14/14 1559    PT SHORT TERM GOAL #1   Title indpendent with initial  HEP   Time 4   Period Weeks   Status On-going   PT SHORT TERM GOAL #2   Title cervical pain with looking downward decreased by 25%.   Time 4   Period Weeks   Status Achieved   PT SHORT TERM GOAL #3   Title lumbar pain with daily activities decreased >/= 25%   Time 4   Period Weeks   Status On-going   PT SHORT TERM GOAL #4   Title understand correct body mechanics with daily tasks to decrease strain on spine   Time 4   Period Weeks   Status On-going           PT Long Term Goals - 11/17/14 1655    PT LONG TERM GOAL #1   Title independent with HEP and how to progress himself   Time 8   Period Weeks   Status On-going   PT LONG TERM GOAL #2   Title pain in cervical with looking downward or turning his head decreased >/= 50%   Time 8   Period Weeks   Status On-going   PT LONG TERM GOAL #3   Title lumbar pain with daily tasks decreased >/= 50%   Time 8   Period Weeks   Status On-going   PT LONG TERM GOAL #4   Title understand how to  demonstrate correct posture    Time 8   Period Weeks   Status On-going               Plan - 12/14/14 1556    Clinical Impression Statement Patient is a 81 year old male with neck and lumbar pain, and restrictions due to tight muscles. Pt is on 2Ltr. O2. Pt will continue to benefit from skilled PT to increase flexibility and strength.   Pt will benefit from skilled therapeutic intervention in order to improve on the following deficits Decreased activity tolerance;Decreased strength;Decreased mobility;Pain;Increased muscle spasms;Decreased range of motion   Rehab Potential Good   Clinical Impairments Affecting Rehab Potential None   PT Frequency 2x / week   PT Duration 8 weeks   PT Treatment/Interventions Electrical Stimulation;Cryotherapy;Moist Heat;Therapeutic exercise;Therapeutic activities;Ultrasound;Neuromuscular re-education;Patient/family education;Manual techniques;Traction;Passive range of motion   PT Next Visit Plan modalities as needed, soft tissue work, core stabilzation exercises, add pelvic tilts in sitting and supine   PT Home Exercise Plan current HEP   Consulted and Agree with Plan of Care Patient        Problem List Patient Active Problem List   Diagnosis Date Noted  . Right calf pain 06/18/2014  . Fatigue 10/28/2012  . CAD (coronary artery disease)   . Peripheral vascular disease ([place])   . Hypersomnia, idiopathic 11/18/2010  . Essential hypertension   . Obstructive sleep apnea   . Rhinitis, nonallergic, chronic 01/29/2007  . COPD  GOLD III 01/29/2007  . Chronic respiratory failure with hypoxia ([place]) 01/29/2007  . Hyperlipidemia     NAUMANN-HOUEGNIFIO,Eilene Voigt PTA 12/14/2014, 4:22 PM  [facility] [address] [place], [place], [address] Phone: [phone]   Fax:  [phone]  Name: [name] MRN: [id number] Date of Birth: 03-23-1929

## 2014-12-15 ENCOUNTER — Other Ambulatory Visit: Payer: Self-pay | Admitting: *Deleted

## 2014-12-15 DIAGNOSIS — E785 Hyperlipidemia, unspecified: Secondary | ICD-10-CM | POA: Diagnosis not present

## 2014-12-15 DIAGNOSIS — I2510 Atherosclerotic heart disease of native coronary artery without angina pectoris: Secondary | ICD-10-CM | POA: Diagnosis not present

## 2014-12-15 DIAGNOSIS — I2781 Cor pulmonale (chronic): Secondary | ICD-10-CM | POA: Diagnosis not present

## 2014-12-15 DIAGNOSIS — I872 Venous insufficiency (chronic) (peripheral): Secondary | ICD-10-CM | POA: Diagnosis not present

## 2014-12-15 DIAGNOSIS — R0602 Shortness of breath: Secondary | ICD-10-CM | POA: Diagnosis not present

## 2014-12-15 DIAGNOSIS — J449 Chronic obstructive pulmonary disease, unspecified: Secondary | ICD-10-CM | POA: Diagnosis not present

## 2014-12-15 DIAGNOSIS — I4949 Other premature depolarization: Secondary | ICD-10-CM | POA: Diagnosis not present

## 2014-12-15 DIAGNOSIS — I6523 Occlusion and stenosis of bilateral carotid arteries: Secondary | ICD-10-CM | POA: Diagnosis not present

## 2014-12-15 DIAGNOSIS — I739 Peripheral vascular disease, unspecified: Secondary | ICD-10-CM | POA: Diagnosis not present

## 2014-12-15 DIAGNOSIS — R0600 Dyspnea, unspecified: Secondary | ICD-10-CM | POA: Diagnosis not present

## 2014-12-15 DIAGNOSIS — I119 Hypertensive heart disease without heart failure: Secondary | ICD-10-CM | POA: Diagnosis not present

## 2014-12-15 MED ORDER — METOPROLOL SUCCINATE ER 25 MG PO TB24
25.0000 mg | ORAL_TABLET | Freq: Every day | ORAL | Status: DC
Start: 2014-12-15 — End: 2015-04-17

## 2014-12-17 ENCOUNTER — Ambulatory Visit: Payer: Medicare Other | Admitting: Physical Therapy

## 2014-12-17 ENCOUNTER — Encounter: Payer: Self-pay | Admitting: Physical Therapy

## 2014-12-17 DIAGNOSIS — M545 Low back pain, unspecified: Secondary | ICD-10-CM

## 2014-12-17 DIAGNOSIS — M542 Cervicalgia: Secondary | ICD-10-CM

## 2014-12-17 NOTE — Therapy (Signed)
[facility] [address], [place], [place], [address] Phone: [phone]   Fax:  [phone]  Physical Therapy Treatment  Patient Details  Name: [name] MRN: [id number] Date of Birth: 12-06-1929 Referring Provider: Dr. Myriam Jacobson  Encounter Date: 12/17/2014      PT End of Session - 12/17/14 1557    Visit Number 6   Number of Visits 10   Date for PT Re-Evaluation 01/05/15   PT Start Time [id number]   PT Stop Time 1629   PT Time Calculation (min) 59 min   Activity Tolerance Patient tolerated treatment well   Behavior During Therapy [facility] for tasks assessed/performed      Past Medical History  Diagnosis Date  . Hypertension   . S/P CABG x 4   . CAD (coronary artery disease)   . COPD (chronic obstructive pulmonary disease) ([place])   . Hyperlipidemia   . Chronic rhinitis   . Chronic respiratory failure ([place])   . Peripheral vascular disease ([place])   . Upper respiratory infection 08/2012  . Asthma   . Syncope and collapse   . Carotid artery occlusion     Past Surgical History  Procedure Laterality Date  . Coronary artery bypass graft  2003  . Cataract extraction  11-20-11  . Lithotripsy    . Prostate biopsy  Oct. 2014    There were no vitals filed for this visit.  Visit Diagnosis:  Right-sided low back pain without sciatica  Cervical pain      Subjective Assessment - 12/17/14 1539    Subjective Pt reports has now more motivation with HEP due to PT sessions. Pt notices improved flexibility with cervical and lumbar movements    Currently in Pain? Yes   Pain Score 6    Pain Location Back   Pain Descriptors / Indicators Aching;Dull   Pain Type Chronic pain   Pain Onset More than a month ago   Pain Frequency Constant   Multiple Pain Sites Yes   Pain Score 3   Pain Location Neck   Pain Orientation Right   Pain Descriptors / Indicators Dull   Pain Type Chronic pain   Pain Onset More than a month ago    Pain Frequency Constant                         [facility] Adult PT Treatment/Exercise - 12/17/14 0001    Bed Mobility   Bed Mobility --  Pt is on 2Ltr O2   Exercises   Exercises Lumbar;Neck   Neck Exercises: Supine   Capital Flexion Other (comment)  3 x 20 sec   Cervical Rotation Both;Other (comment)  each side in sitting and supine   Lateral Flexion Both  3 x 20 sec   Lumbar Exercises: Stretches   Active Hamstring Stretch 3 reps;20 seconds   Single Knee to Chest Stretch 3 reps;20 seconds  each leg on HMP   Double Knee to Chest Stretch 3 reps;20 seconds  on HMP   Lower Trunk Rotation 3 reps;20 seconds  on HMP   Pelvic Tilt Other (comment)  in sitting and supine   Lumbar Exercises: Supine   Bridge 10 reps;3 seconds   Modalities   Modalities Moist Heat   Moist Heat Therapy   Number Minutes Moist Heat 15 Minutes   Moist Heat Location Lumbar Spine;Cervical  with 3 pillow in hooklying  PT Short Term Goals - 12/14/14 1559    PT SHORT TERM GOAL #1   Title indpendent with initial HEP   Time 4   Period Weeks   Status On-going   PT SHORT TERM GOAL #2   Title cervical pain with looking downward decreased by 25%.   Time 4   Period Weeks   Status Achieved   PT SHORT TERM GOAL #3   Title lumbar pain with daily activities decreased >/= 25%   Time 4   Period Weeks   Status On-going   PT SHORT TERM GOAL #4   Title understand correct body mechanics with daily tasks to decrease strain on spine   Time 4   Period Weeks   Status On-going           PT Long Term Goals - 11/17/14 1655    PT LONG TERM GOAL #1   Title independent with HEP and how to progress himself   Time 8   Period Weeks   Status On-going   PT LONG TERM GOAL #2   Title pain in cervical with looking downward or turning his head decreased >/= 50%   Time 8   Period Weeks   Status On-going   PT LONG TERM GOAL #3   Title lumbar pain with daily tasks decreased >/=  50%   Time 8   Period Weeks   Status On-going   PT LONG TERM GOAL #4   Title understand how to demonstrate correct posture    Time 8   Period Weeks   Status On-going               Plan - 12/17/14 1558    Clinical Impression Statement Patient is a 81 y.o. male with neck and lumbar pain, since start of care patients ROM cervical improved and presents with improved lumbar flexibility as shown in stretching exercises   Pt will benefit from skilled therapeutic intervention in order to improve on the following deficits Decreased activity tolerance;Decreased strength;Decreased mobility;Pain;Increased muscle spasms;Decreased range of motion   Rehab Potential Good   PT Frequency 2x / week   PT Duration 8 weeks   PT Treatment/Interventions Electrical Stimulation;Cryotherapy;Moist Heat;Therapeutic exercise;Therapeutic activities;Ultrasound;Neuromuscular re-education;Patient/family education;Manual techniques;Traction;Passive range of motion   PT Next Visit Plan modalities as needed, soft tissue work, core stabilzation exercises, add pelvic tilts in sitting and supine   PT Home Exercise Plan current HEP   Consulted and Agree with Plan of Care Patient        Problem List Patient Active Problem List   Diagnosis Date Noted  . Right calf pain 06/18/2014  . Fatigue 10/28/2012  . CAD (coronary artery disease)   . Peripheral vascular disease ([place])   . Hypersomnia, idiopathic 11/18/2010  . Essential hypertension   . Obstructive sleep apnea   . Rhinitis, nonallergic, chronic 01/29/2007  . COPD  GOLD III 01/29/2007  . Chronic respiratory failure with hypoxia ([place]) 01/29/2007  . Hyperlipidemia     NAUMANN-HOUEGNIFIO,Pixie Burgener PTA 12/17/2014, 4:08 PM  [facility] [address] [place], [place], [address] Phone: [phone]   Fax:  [phone]  Name: [name] MRN: [id number] Date of Birth: 1929-04-12

## 2014-12-21 ENCOUNTER — Encounter: Payer: Medicare Other | Admitting: Physical Therapy

## 2014-12-21 DIAGNOSIS — I2510 Atherosclerotic heart disease of native coronary artery without angina pectoris: Secondary | ICD-10-CM | POA: Diagnosis not present

## 2014-12-21 DIAGNOSIS — I6523 Occlusion and stenosis of bilateral carotid arteries: Secondary | ICD-10-CM | POA: Diagnosis not present

## 2014-12-21 DIAGNOSIS — R0600 Dyspnea, unspecified: Secondary | ICD-10-CM | POA: Diagnosis not present

## 2014-12-21 DIAGNOSIS — I2781 Cor pulmonale (chronic): Secondary | ICD-10-CM | POA: Diagnosis not present

## 2014-12-21 DIAGNOSIS — I739 Peripheral vascular disease, unspecified: Secondary | ICD-10-CM | POA: Diagnosis not present

## 2014-12-21 DIAGNOSIS — R0602 Shortness of breath: Secondary | ICD-10-CM | POA: Diagnosis not present

## 2014-12-21 DIAGNOSIS — I4949 Other premature depolarization: Secondary | ICD-10-CM | POA: Diagnosis not present

## 2014-12-21 DIAGNOSIS — R0609 Other forms of dyspnea: Secondary | ICD-10-CM | POA: Diagnosis not present

## 2014-12-21 DIAGNOSIS — E785 Hyperlipidemia, unspecified: Secondary | ICD-10-CM | POA: Diagnosis not present

## 2014-12-21 DIAGNOSIS — I872 Venous insufficiency (chronic) (peripheral): Secondary | ICD-10-CM | POA: Diagnosis not present

## 2014-12-21 DIAGNOSIS — J449 Chronic obstructive pulmonary disease, unspecified: Secondary | ICD-10-CM | POA: Diagnosis not present

## 2014-12-21 DIAGNOSIS — I119 Hypertensive heart disease without heart failure: Secondary | ICD-10-CM | POA: Diagnosis not present

## 2014-12-24 ENCOUNTER — Ambulatory Visit: Payer: Medicare Other | Admitting: Physical Therapy

## 2014-12-24 DIAGNOSIS — M542 Cervicalgia: Secondary | ICD-10-CM

## 2014-12-24 DIAGNOSIS — M545 Low back pain, unspecified: Secondary | ICD-10-CM

## 2014-12-24 NOTE — Therapy (Signed)
[facility] [address], [place], [place], [address] Phone: [phone]   Fax:  [phone]  Physical Therapy Treatment  Patient Details  Name: [name] MRN: [id number] Date of Birth: 1929-09-08 Referring Provider: Dr. Myriam Jacobson  Encounter Date: 12/24/2014      PT End of Session - 12/24/14 1553    Visit Number 7   Number of Visits 10   Date for PT Re-Evaluation 01/05/15   PT Start Time 1536   PT Stop Time [id number]   PT Time Calculation (min) 58 min   Activity Tolerance Patient tolerated treatment well   Behavior During Therapy [facility] for tasks assessed/performed      Past Medical History  Diagnosis Date  . Hypertension   . S/P CABG x 4   . CAD (coronary artery disease)   . COPD (chronic obstructive pulmonary disease) ([place])   . Hyperlipidemia   . Chronic rhinitis   . Chronic respiratory failure ([place])   . Peripheral vascular disease ([place])   . Upper respiratory infection 08/2012  . Asthma   . Syncope and collapse   . Carotid artery occlusion     Past Surgical History  Procedure Laterality Date  . Coronary artery bypass graft  2003  . Cataract extraction  11-20-11  . Lithotripsy    . Prostate biopsy  Oct. 2014    There were no vitals filed for this visit.  Visit Diagnosis:  Right-sided low back pain without sciatica  Cervical pain      Subjective Assessment - 12/24/14 1550    Subjective Pt reports did not sleept well last night may be due to cold weather. Despite he is eager to participate in PT session   Pertinent History Pt with chronic back and neck pain since 15 years.    How long can you sit comfortably? 1 hour with good posture   How long can you stand comfortably? 20 min.    How long can you walk comfortably? every day walks 25 min on treadmill   Patient Stated Goals reduce pain in back  and neck is less   Pain Score 4   Pain Location Neck   Pain Orientation Right   Pain Descriptors /  Indicators Dull   Pain Type Chronic pain   Pain Onset More than a month ago   Pain Frequency Constant   Aggravating Factors  turning head to side, tipping head down, waking up with it   Pain Relieving Factors stretching neck   Effect of Pain on Daily Activities none                         [facility] Adult PT Treatment/Exercise - 12/24/14 0001    Bed Mobility   Bed Mobility --  Pt on 2Ltr 02   Exercises   Exercises Lumbar;Neck   Neck Exercises: Seated   Other Seated Exercise chestpress 15# 1x10  challenging for patient   Neck Exercises: Supine   Capital Flexion Other (comment)  3 x 20 sec    Cervical Rotation Both;Other (comment)  in sitting and supine   Lumbar Exercises: Stretches   Active Hamstring Stretch 3 reps;20 seconds  in sitting   Single Knee to Chest Stretch 3 reps;20 seconds  each leg while on HMP   Double Knee to Chest Stretch 3 reps;20 seconds  on HMP   Lower Trunk Rotation 3 reps;20 seconds  on HMP   Pelvic Tilt Other (comment)  in sitting and supine   Lumbar Exercises: Standing   Row Strengthening  3 x10, with focus on abdominal bracing   Lumbar Exercises: Supine   Bridge 10 reps;3 seconds   Modalities   Modalities Moist Heat   Moist Heat Therapy   Number Minutes Moist Heat 15 Minutes   Moist Heat Location Lumbar Spine;Cervical  with 3 pillows in hooklying                PT Education - 12/24/14 1616    Education provided Yes   Education Details bil horizontal abduction, D2 - yellow t-band for home use    Person(s) Educated Patient   Methods Explanation;Demonstration;Handout   Comprehension Verbalized understanding;Returned demonstration          PT Short Term Goals - 12/24/14 1601    PT SHORT TERM GOAL #1   Title indpendent with initial HEP   Time 4   Period Weeks   Status On-going   PT SHORT TERM GOAL #2   Title cervical pain with looking downward decreased by 25%.   Time 4   Status Achieved   PT SHORT TERM GOAL  #3   Title lumbar pain with daily activities decreased >/= 25%   Time 4   Period Weeks   Status On-going   PT SHORT TERM GOAL #4   Title understand correct body mechanics with daily tasks to decrease strain on spine   Time 4   Period Weeks   Status On-going           PT Long Term Goals - 11/17/14 1655    PT LONG TERM GOAL #1   Title independent with HEP and how to progress himself   Time 8   Period Weeks   Status On-going   PT LONG TERM GOAL #2   Title pain in cervical with looking downward or turning his head decreased >/= 50%   Time 8   Period Weeks   Status On-going   PT LONG TERM GOAL #3   Title lumbar pain with daily tasks decreased >/= 50%   Time 8   Period Weeks   Status On-going   PT LONG TERM GOAL #4   Title understand how to demonstrate correct posture    Time 8   Period Weeks   Status On-going               Plan - 12/24/14 1558    Clinical Impression Statement Pateint is a 81 y.o. male with neck and lumbar pain, pt able to perform gentle strengthening exercises with light weights. Pt will continue to benefit from skilled PT to help control pain, increase flexibility and incr ROM   Pt will benefit from skilled therapeutic intervention in order to improve on the following deficits Decreased activity tolerance;Decreased strength;Decreased mobility;Pain;Increased muscle spasms;Decreased range of motion   Rehab Potential Good   PT Frequency 2x / week   PT Duration 8 weeks   PT Treatment/Interventions Electrical Stimulation;Cryotherapy;Moist Heat;Therapeutic exercise;Therapeutic activities;Ultrasound;Neuromuscular re-education;Patient/family education;Manual techniques;Traction;Passive range of motion   PT Next Visit Plan modalities as needed, soft tissue work, core stabilzation exercises, add pelvic tilts in sitting and supine   PT Home Exercise Plan current HEP   Consulted and Agree with Plan of Care Patient        Problem List Patient Active  Problem List   Diagnosis Date Noted  . Right calf pain 06/18/2014  . Fatigue 10/28/2012  . CAD (coronary artery disease)   . Peripheral vascular  disease ([place])   . Hypersomnia, idiopathic 11/18/2010  . Essential hypertension   . Obstructive sleep apnea   . Rhinitis, nonallergic, chronic 01/29/2007  . COPD  GOLD III 01/29/2007  . Chronic respiratory failure with hypoxia ([place]) 01/29/2007  . Hyperlipidemia     NAUMANN-HOUEGNIFIO,Cam Dauphin PTA 12/24/2014, 5:11 PM  [facility] [address], [place], [place], [address] Phone: [phone]   Fax:  [phone]  Name: [name] MRN: [id number] Date of Birth: 1929-11-17

## 2014-12-24 NOTE — Patient Instructions (Signed)
PNF Strengthening: Resisted   Standing with resistive band around each hand, bring right arm up and away, thumb back. Do both sides. Repeat 10____ times per set. Do _1-3___ sets per session. Do ___1_ sessions per day.  http://orth.exer.us/918   Copyright  VHI. All rights reserved.  Strengthening: Chest Pull - Resisted   With resistive band looped around each hand, and arms straight out in front, stretch band across chest. Repeat __10__ times per set. Do 1-3____ sets per session. Do _1___ sessions per day.  http://orth.exer.us/926   Copyright  VHI. All rights reserved.   ALSO PULL BAND APART AND THEN RAISE OVERHEAD FROM WAIST. SAME REPS. 

## 2014-12-25 ENCOUNTER — Other Ambulatory Visit: Payer: Self-pay | Admitting: Internal Medicine

## 2014-12-25 NOTE — Telephone Encounter (Signed)
MW please advise on refill. Thanks. 

## 2014-12-28 ENCOUNTER — Ambulatory Visit: Payer: Medicare Other | Admitting: Physical Therapy

## 2014-12-28 ENCOUNTER — Encounter: Payer: Self-pay | Admitting: Physical Therapy

## 2014-12-28 DIAGNOSIS — M542 Cervicalgia: Secondary | ICD-10-CM | POA: Diagnosis not present

## 2014-12-28 DIAGNOSIS — M545 Low back pain, unspecified: Secondary | ICD-10-CM

## 2014-12-28 NOTE — Therapy (Signed)
[facility] [address] [place], [place], [address] Phone: [phone]   Fax:  [phone]  Physical Therapy Treatment  Patient Details  Name: [name] MRN: [id number] Date of Birth: 06/11/1929 Referring Provider: Dr. Myriam Jacobson  Encounter Date: 12/28/2014      PT End of Session - 12/28/14 1547    Visit Number 8   Number of Visits 10   Date for PT Re-Evaluation 01/05/15   PT Start Time 1545  15 min late   PT Stop Time 1617   PT Time Calculation (min) 32 min   Activity Tolerance Patient limited by lethargy  Hard to breathe    Behavior During Therapy [facility] for tasks assessed/performed      Past Medical History  Diagnosis Date  . Hypertension   . S/P CABG x 4   . CAD (coronary artery disease)   . COPD (chronic obstructive pulmonary disease) ([place])   . Hyperlipidemia   . Chronic rhinitis   . Chronic respiratory failure ([place])   . Peripheral vascular disease ([place])   . Upper respiratory infection 08/2012  . Asthma   . Syncope and collapse   . Carotid artery occlusion     Past Surgical History  Procedure Laterality Date  . Coronary artery bypass graft  2003  . Cataract extraction  11-20-11  . Lithotripsy    . Prostate biopsy  Oct. 2014    There were no vitals filed for this visit.  Visit Diagnosis:  Right-sided low back pain without sciatica  Cervical pain      Subjective Assessment - 12/28/14 1545    Subjective When I am dilligent with my stretches I feel pretty good. Today is a harder day for my breathing. Cold weather makes it difficult.    Currently in Pain? Yes   Pain Score 3    Pain Location Back   Pain Orientation Lower   Pain Descriptors / Indicators Dull   Aggravating Factors  Doing stretches   Pain Relieving Factors In the morning is hardest   Multiple Pain Sites No                         [facility] Adult PT Treatment/Exercise - 12/28/14 0001    Neck Exercises:  Machines for Strengthening   Cybex Chest Press 1 plate 2 [id number]   Neck Exercises: Seated   Cervical Rotation Both;10 reps   Lateral Flexion Both;10 reps   Lumbar Exercises: Stretches   Active Hamstring Stretch 2 reps;20 seconds   Single Knee to Chest Stretch 2 reps;20 seconds   Double Knee to Chest Stretch 2 reps;20 seconds   Lower Trunk Rotation 2 reps;20 seconds   Pelvic Tilt 10 seconds  10x   Lumbar Exercises: Supine   Bridge 10 reps;2 seconds   Other Supine Lumbar Exercises yellow band horizontal abd 10x2  VC on technique   Moist Heat Therapy   Number Minutes Moist Heat --  Throughout treatment   Moist Heat Location Lumbar Spine;Cervical  with 3 pillows in hooklying                  PT Short Term Goals - 12/28/14 1619    PT SHORT TERM GOAL #1   Title indpendent with initial HEP   Time 4   Period Weeks   Status Achieved   PT SHORT TERM GOAL #3   Title lumbar pain with daily activities decreased >/= 25%  Time 4   Period Weeks   Status Achieved  As long as he does his stretches.   PT SHORT TERM GOAL #4   Title understand correct body mechanics with daily tasks to decrease strain on spine   Time 4   Period Weeks   Status On-going           PT Long Term Goals - 11/17/14 1655    PT LONG TERM GOAL #1   Title independent with HEP and how to progress himself   Time 8   Period Weeks   Status On-going   PT LONG TERM GOAL #2   Title pain in cervical with looking downward or turning his head decreased >/= 50%   Time 8   Period Weeks   Status On-going   PT LONG TERM GOAL #3   Title lumbar pain with daily tasks decreased >/= 50%   Time 8   Period Weeks   Status On-going   PT LONG TERM GOAL #4   Title understand how to demonstrate correct posture    Time 8   Period Weeks   Status On-going               Plan - 12/28/14 1617    Clinical Impression Statement Pt was 15 min late today and presents with difficulty breathing. He just had to  transition a bit slower due to the cold he reports. He performed all exercises well, no pain and good movement. He is considering walkiing again on his treamill for exercise. He has not done this for some time.    Pt will benefit from skilled therapeutic intervention in order to improve on the following deficits Decreased activity tolerance;Decreased strength;Decreased mobility;Pain;Increased muscle spasms;Decreased range of motion   Rehab Potential Good   Clinical Impairments Affecting Rehab Potential None   PT Frequency 2x / week   PT Duration 8 weeks   PT Treatment/Interventions Electrical Stimulation;Cryotherapy;Moist Heat;Therapeutic exercise;Therapeutic activities;Ultrasound;Neuromuscular re-education;Patient/family education;Manual techniques;Traction;Passive range of motion   PT Next Visit Plan Level One core stabs in supine   Consulted and Agree with Plan of Care Patient        Problem List Patient Active Problem List   Diagnosis Date Noted  . Right calf pain 06/18/2014  . Fatigue 10/28/2012  . CAD (coronary artery disease)   . Peripheral vascular disease ([place])   . Hypersomnia, idiopathic 11/18/2010  . Essential hypertension   . Obstructive sleep apnea   . Rhinitis, nonallergic, chronic 01/29/2007  . COPD  GOLD III 01/29/2007  . Chronic respiratory failure with hypoxia ([place]) 01/29/2007  . Hyperlipidemia     [name], PTA 12/28/2014, 4:21 PM  [facility] [address] [place], [place], [address] Phone: [phone]   Fax:  [phone]  Name: [name] MRN: [id number] Date of Birth: [birth year]

## 2014-12-30 ENCOUNTER — Other Ambulatory Visit: Payer: Self-pay | Admitting: Interventional Cardiology

## 2014-12-31 ENCOUNTER — Ambulatory Visit: Payer: Medicare Other | Admitting: Physical Therapy

## 2014-12-31 ENCOUNTER — Encounter: Payer: Self-pay | Admitting: Physical Therapy

## 2014-12-31 DIAGNOSIS — M542 Cervicalgia: Secondary | ICD-10-CM

## 2014-12-31 DIAGNOSIS — M545 Low back pain, unspecified: Secondary | ICD-10-CM

## 2014-12-31 NOTE — Therapy (Signed)
[facility] [address] [place], [place], [address] Phone: [phone]   Fax:  [phone]  Physical Therapy Treatment  Patient Details  Name: [name] MRN: [id number] Date of Birth: [birth year] Referring Provider: Dr. Myriam Jacobson  Encounter Date: 12/31/2014      PT End of Session - 12/31/14 1614    Visit Number 9   Date for PT Re-Evaluation 01/05/15   PT Start Time 1536   PT Stop Time 1615   PT Time Calculation (min) 39 min   Activity Tolerance Patient limited by lethargy  Hard to breathe   Behavior During Therapy [facility] for tasks assessed/performed      Past Medical History  Diagnosis Date  . Hypertension   . S/P CABG x 4   . CAD (coronary artery disease)   . COPD (chronic obstructive pulmonary disease) ([place])   . Hyperlipidemia   . Chronic rhinitis   . Chronic respiratory failure ([place])   . Peripheral vascular disease ([place])   . Upper respiratory infection 08/2012  . Asthma   . Syncope and collapse   . Carotid artery occlusion     Past Surgical History  Procedure Laterality Date  . Coronary artery bypass graft  2003  . Cataract extraction  11-20-11  . Lithotripsy    . Prostate biopsy  Oct. 2014    There were no vitals filed for this visit.  Visit Diagnosis:  Right-sided low back pain without sciatica  Cervical pain      Subjective Assessment - 12/31/14 1545    Subjective I can turn my head to the right with 25% greater ease.  My back is 25% better.  The morning I have the worse pain.    Pertinent History Pt with chronic back and neck pain since 15 years.    How long can you sit comfortably? 1 hour with good posture   How long can you stand comfortably? 20 min.    How long can you walk comfortably? every day walks 25 min on treadmill   Patient Stated Goals reduce pain in back  and neck is less   Currently in Pain? Yes   Pain Score 4    Pain Location Back   Pain Orientation Lower    Pain Type Chronic pain   Pain Radiating Towards goes down right leg   Pain Onset More than a month ago   Pain Frequency Constant  intensity is intermittent   Aggravating Factors  siting too long, standing and reaching   Pain Relieving Factors morning   Multiple Pain Sites No   Pain Score 4   Pain Location Neck   Pain Orientation Right   Pain Descriptors / Indicators Dull   Pain Type Chronic pain   Pain Onset More than a month ago   Pain Frequency Constant   Aggravating Factors  tipping head down, AM   Pain Relieving Factors stretching, as the day goes on            [facility] PT Assessment - 12/31/14 0001    Assessment   Medical Diagnosis neck and lower back pain   Referring Provider Dr. Myriam Jacobson   Onset Date/Surgical Date 10/10/14   Precautions   Precautions Other (comment)   Precaution Comments on oxygen   Balance Screen   Has the patient fallen in the past 6 months No   Has the patient had a decrease in activity level because of a fear of falling?  No   Is the patient reluctant to leave their home because of a fear of falling?  No   Prior Function   Level of Independence Independent   Cognition   Overall Cognitive Status Within Functional Limits for tasks assessed   Observation/Other Assessments   Focus on Therapeutic Outcomes (FOTO)  51% better   AROM   Cervical Flexion full   Cervical Extension full   Cervical - Right Side Bend decreased by 25%   Cervical - Left Side Bend full   Cervical - Right Rotation decreased by 10%   Cervical - Left Rotation full   Lumbar Flexion full   Lumbar Extension decresaed by 25%   Lumbar - Right Side Bend decreased by 25%   Lumbar - Left Side Bend full                     [facility] Adult PT Treatment/Exercise - 12/31/14 0001    Exercises   Exercises Lumbar;Neck   Lumbar Exercises: Stretches   Active Hamstring Stretch 2 reps;20 seconds   Single Knee to Chest Stretch 2 reps;20 seconds   Double Knee to Chest Stretch  2 reps;20 seconds   Lower Trunk Rotation 2 reps;20 seconds   Pelvic Tilt 10 seconds  10x   Lumbar Exercises: Supine   Bridge 10 reps;2 seconds   Other Supine Lumbar Exercises yellow band horizontal abd 10x2  VC on technique   Moist Heat Therapy   Number Minutes Moist Heat --  throughout treatment   Moist Heat Location Lumbar Spine;Cervical  with 3 pillows in hooklying                PT Education - 12/31/14 1614    Education provided No          PT Short Term Goals - 12/31/14 1542    PT SHORT TERM GOAL #1   Title indpendent with initial HEP   Time 4   Period Weeks   Status Achieved   PT SHORT TERM GOAL #2   Title cervical pain with looking downward decreased by 25%.   Time 4   Period Weeks   Status Achieved   PT SHORT TERM GOAL #3   Title lumbar pain with daily activities decreased >/= 25%   Time 4   Period Weeks   Status Achieved   PT SHORT TERM GOAL #4   Title understand correct body mechanics with daily tasks to decrease strain on spine   Time 4   Period Weeks   Status Achieved           PT Long Term Goals - 12/31/14 1543    PT LONG TERM GOAL #1   Title independent with HEP and how to progress himself   Time 8   Period Weeks   Status Achieved   PT LONG TERM GOAL #2   Title pain in cervical with looking downward or turning his head decreased >/= 50%   Time 8   Period Weeks   Status Achieved   PT LONG TERM GOAL #3   Title lumbar pain with daily tasks decreased >/= 50%   Time 8   Period Weeks   Status Not Met  20% better   PT LONG TERM GOAL #4   Title understand how to demonstrate correct posture    Time 8   Period Weeks   Status Achieved               Plan - 12/31/14 1617  Clinical Impression Statement Patietn is a 81 year old male with cervical and back pain.  Patient FOTO score is 51% limitation.  Patient has improved cervical and lumbar ROM by 25%. Patient has to go slowly with exercise due to difficulty with breathing.   Patient wanted to be discharged due to hard to leave home with breathing.  Patient has met LTG # 1 and 4.  Patient has not met LTG# 2 and 3 due to slightn improvement in pain.  Patient reports pain is worse when he gets up.     Pt will benefit from skilled therapeutic intervention in order to improve on the following deficits Decreased activity tolerance;Decreased strength;Decreased mobility;Pain;Increased muscle spasms;Decreased range of motion   Rehab Potential Good   Clinical Impairments Affecting Rehab Potential None   PT Treatment/Interventions Electrical Stimulation;Cryotherapy;Moist Heat;Therapeutic exercise;Therapeutic activities;Ultrasound;Neuromuscular re-education;Patient/family education;Manual techniques;Traction;Passive range of motion   PT Next Visit Plan Discharge to HEP   PT Home Exercise Plan current HEP   Consulted and Agree with Plan of Care Patient          G-Codes - 12/31/14 1555    Functional Assessment Tool Used FOTO score is 51% limitation   Functional Limitation Changing and maintaining body position   Changing and Maintaining Body Position Goal Status (G8982) At least 40 percent but less than 60 percent impaired, limited or restricted   Changing and Maintaining Body Position Discharge Status (G8983) At least 40 percent but less than 60 percent impaired, limited or restricted      Problem List Patient Active Problem List   Diagnosis Date Noted  . Right calf pain 06/18/2014  . Fatigue 10/28/2012  . CAD (coronary artery disease)   . Peripheral vascular disease ([facility])   . Hypersomnia, idiopathic 11/18/2010  . Essential hypertension   . Obstructive sleep apnea   . Rhinitis, nonallergic, chronic 01/29/2007  . COPD  GOLD III 01/29/2007  . Chronic respiratory failure with hypoxia ([facility]) 01/29/2007  . Hyperlipidemia      , PT 12/31/2014 4:23 PM    [facility] [address], [address] , [place], [address] Phone: [phone]   Fax:  [phone]  Name: [name] MRN: [id number] Date of Birth: 10/30/1929   PHYSICAL THERAPY DISCHARGE SUMMARY  Visits from Start of Care: 9  Current functional level related to goals / functional outcomes: See above.    Remaining deficits: See above   Education / Equipment: HEP Plan: Patient agrees to discharge.  Patient goals were partially met. Patient is being discharged due to the patient's request. Thank you for the referral.  , PT 12/31/2014 4:23 PM   ?????          

## 2015-01-01 ENCOUNTER — Other Ambulatory Visit: Payer: Self-pay | Admitting: Internal Medicine

## 2015-01-27 DIAGNOSIS — J449 Chronic obstructive pulmonary disease, unspecified: Secondary | ICD-10-CM | POA: Diagnosis not present

## 2015-01-27 DIAGNOSIS — R0609 Other forms of dyspnea: Secondary | ICD-10-CM | POA: Diagnosis not present

## 2015-01-27 DIAGNOSIS — R002 Palpitations: Secondary | ICD-10-CM | POA: Diagnosis not present

## 2015-01-27 DIAGNOSIS — I739 Peripheral vascular disease, unspecified: Secondary | ICD-10-CM | POA: Diagnosis not present

## 2015-01-27 DIAGNOSIS — I6523 Occlusion and stenosis of bilateral carotid arteries: Secondary | ICD-10-CM | POA: Diagnosis not present

## 2015-01-27 DIAGNOSIS — E785 Hyperlipidemia, unspecified: Secondary | ICD-10-CM | POA: Diagnosis not present

## 2015-01-27 DIAGNOSIS — I2510 Atherosclerotic heart disease of native coronary artery without angina pectoris: Secondary | ICD-10-CM | POA: Diagnosis not present

## 2015-01-27 DIAGNOSIS — I2781 Cor pulmonale (chronic): Secondary | ICD-10-CM | POA: Diagnosis not present

## 2015-01-27 DIAGNOSIS — R0602 Shortness of breath: Secondary | ICD-10-CM | POA: Diagnosis not present

## 2015-01-27 DIAGNOSIS — I119 Hypertensive heart disease without heart failure: Secondary | ICD-10-CM | POA: Diagnosis not present

## 2015-01-27 DIAGNOSIS — I4949 Other premature depolarization: Secondary | ICD-10-CM | POA: Diagnosis not present

## 2015-01-27 DIAGNOSIS — I872 Venous insufficiency (chronic) (peripheral): Secondary | ICD-10-CM | POA: Diagnosis not present

## 2015-02-16 DIAGNOSIS — H1859 Other hereditary corneal dystrophies: Secondary | ICD-10-CM | POA: Diagnosis not present

## 2015-02-16 DIAGNOSIS — H04123 Dry eye syndrome of bilateral lacrimal glands: Secondary | ICD-10-CM | POA: Diagnosis not present

## 2015-02-16 DIAGNOSIS — Z961 Presence of intraocular lens: Secondary | ICD-10-CM | POA: Diagnosis not present

## 2015-02-16 DIAGNOSIS — H35372 Puckering of macula, left eye: Secondary | ICD-10-CM | POA: Diagnosis not present

## 2015-02-16 DIAGNOSIS — H353131 Nonexudative age-related macular degeneration, bilateral, early dry stage: Secondary | ICD-10-CM | POA: Diagnosis not present

## 2015-02-16 DIAGNOSIS — H26493 Other secondary cataract, bilateral: Secondary | ICD-10-CM | POA: Diagnosis not present

## 2015-02-19 ENCOUNTER — Ambulatory Visit (INDEPENDENT_AMBULATORY_CARE_PROVIDER_SITE_OTHER): Payer: Medicare Other | Admitting: Internal Medicine

## 2015-02-19 ENCOUNTER — Encounter: Payer: Self-pay | Admitting: Internal Medicine

## 2015-02-19 VITALS — BP 142/78 | HR 71 | Ht 69.0 in | Wt 148.0 lb

## 2015-02-19 DIAGNOSIS — J449 Chronic obstructive pulmonary disease, unspecified: Secondary | ICD-10-CM | POA: Diagnosis not present

## 2015-02-19 DIAGNOSIS — J9611 Chronic respiratory failure with hypoxia: Secondary | ICD-10-CM | POA: Diagnosis not present

## 2015-02-19 NOTE — Patient Instructions (Addendum)
For significant swelling take furosemide 20 mg one daily as need as per your med calendar   Please schedule a follow up visit in 3 months but call sooner if needed to see Tammy NP with all medications in hand for new med calendar  Late add:  Prednisone not listed as a maint on the med calendar and needs to be added next ov

## 2015-02-19 NOTE — Progress Notes (Signed)
Subjective:    Patient ID: [name], male    DOB: 1929-09-09   MRN: [id number]     Summary: Pulmonary/ f/u ov try astepro for pnds   Primary Provider/Referring Provider: [name]    Brief patient profile:  25  yowm last smoked 1983 with GOLD III COPD with some reversibility and 02 dep at hs and with activity    History of Present Illness   05/01/2011 f/u ov/[name] cc 50 ft outside, one aisle at [facility], whole store with 02. No cough and minimal need for saba Seen at [facility] rec change ipatropium neb  to atrovent > no change doe. rec ry tudorza one puff twice daily and stop atrovent and see how it effects your activity performance, if not benefit ok to change back to atrovent   10/04/2011 f/u ov/[name] did not bring med calendar cc  Loosing ground on tudorza so went to atrovent neb and seemed to help. rec Plan A = use automatically no matter what Symbicort followed by Atrovent each am and symbiocort again in 12 hours plus continue the atrovent though the day in between about every 4-6 hours Only use your albuterol (xopenex hfa -puffer-  is Plan B,  Nebulizer is plan C) .      12/18/2012 f/u ov/[name] re: copd/ 02  at bedtime and with exertion  Chief Complaint  Patient presents with  . Follow-up    Increased sob at all times x1 month.  using O2 more frequently   actually no sob at rest or at hs, wears 02 with activity and when he does wear it the sats are fine even when he has to stop due to sob rec Plan A= automatic  symbiocort and tudorza twice daily and ok to use your 02 more consistently with activity that you know makes you short of breath Plan B = backup > xopenex 2 every 4 if needed if can't catch your breath Plan C = xopenex neb every 4 hours if plan B not working, always try plan B first  Please see patient coordinator before you leave today  to schedule rehab  02/14/2013 f/u ov/[name] re:  Chief Complaint  Patient presents with  . Follow-up    Wants to make sure his medications  are correct.  Pt c/o increased SOB with exertion.  easily confused with details of care, only using hfa xopenex not the neb xopenex, times right before he goes to rehab and feels he's making slow progress.  >>changed Symbicort to [facility] and atrovent neb to spiriva respimat     04/25/2013 f/u ov/[name] re:  Chief Complaint  Patient presents with  . Acute Visit    Pt c/o increased DOE for the past 3 wks- worse for the past 4-5 days. He states that he is SOB with any exertion at all such as just raising up his arms. He also c/o nasal congestion for the past 3 months. He is using xopenex HFA at least twice per day.    incxrease doe assoc with prod of more mucus and nasal congestion  but better overnight and worse as day goes on, occ green mucus but mostly white No desats walking on 02 at rehab 2lpm rec Prednisone 10 mg take  4 each am x 2 days,   2 each am x 2 days,  1 each am x 2 days and stop  Follow med calendar    06/19/2013 f/u ov/[name] re: GOLD III COPD with some reversibility on dulera 200 2bid and  spriva respimat  Chief Complaint  Patient presents with  . Follow-up    Pt c/o increased DOE for the past month. He states he gets SOB with walking approx 50 ft.    using xopenex qid never neb Did get def transient  benefit from prednisone  >steroid challenge   07/17/2013 Follow up and Med review  Returns for follow up and med review . We reviewed  All his meds and organized them into a med calendar w/ pt education.  Last visit was started on prednisone 20 mg with taper down to 10 mg and hold. Patient says that his breathing has improved. We discussed steroid use, and he wishes to taper off of this due to potential side effects.  rec Follow med calendar closely and bring to each visit.  Decrease prednisone 10mg  1/2 daily for 1 week then 1/2 every other day for 1 week and then stop.  May use saline nasal spray and gel As needed  For congestion .    08/27/2013 f/u ov/[name] re: copd/ 02 dep  with sleep/ ex / on symbicort and spiriva  Chief Complaint  Patient presents with  . Follow-up    ROV : Pt c/o incr SOB and states that he has had a recent decrease in activity--does not feel like his stamina/endurance is where it needs to be.   Variable doe  seemed some better on prednisone - never sob at rest or sleeping  Wears 02 when sleeps at 2 and exercises at 2lpm on treadmill at 1.2 mph level, goes 5 min rest 1 min Has not tried pre treating with xopenex before he does the treadmill, never tried slow setting, never uses neb xopenex rec Try using your xopenex hfa  before planned walk on your treadmill to see what difference if any this makes Prevnar 13 given today Resume prednisone 10 mg x 2 daily when xopenx is not lasting 4 hours, then 1 daily x 5 days and stop      10/23/2013 f/u ov/[name] re: copd/02 dep on symb/spiriva/ xopenex prn plus back up pred short course Chief Complaint  Patient presents with  . Follow-up    SOB   feels prednisone helped a lot with sob and less need for saba Able do treadmill x 15 min x 1 mph no grade no  wheeze overt sinus or hb symptoms.   rec Try using your xopenex hfa  before planned walk on your treadmill to see what difference if any this makes prevnar 13 given today Resume prednisone 10 mg x 2 daily when xopenx is not lasting 4 hours, then 1 daily x 5 days and stop   02/26/2014 f/u ov/[name] re: GOLD III/ confused with action plan portion of med calendar  Chief Complaint  Patient presents with  . Follow-up    Pt c/o increased cough and SOB for the past 3 wks. His cough is worse in the am and is prod with white to light green sputum. He is using xopenex inhaler about 3-4 x per day and has not used neb.    treadmill x 15 min s stopping  x 1.2 mph flat/ on 02 2lpm with sats no lower than 93  Cough is day > noct, not flaring in am, no purulent sputum rec Goal is to keep sats over 90% with activity     06/01/2014 f/u ov/[name] re: COPD GOLD III/  symb/ spiriva respimat prednsione ? Dose / did not bring med calendar Chief Complaint  Patient presents  with  . Follow-up    Pt states his breathing is progressively worse since the last visit.    increased to 32 m on 02  1.2 mph, no tilt s stopping p 10 min to rest on 2lpm with adequate sats reported    some days  Prednisone 10 mg tapered but confused with instructions Drippy nose no better with flonase / clariton > has not tried 1st gen H1  Not much change using saba before ex  >>change claritin to chlortrimeton   06/15/2014 Follow up and Med review : COPD GOLD III/  Pt returns for follow up and med review  We reviewed all his meds and organized them into a med calendar with pt education  Appears to be taking meds correctly.  Last visit with copd flare , changed claritin to chlortabs but unable to tolerate during daytime Due to sleepiness.  Also to take 1/2 toprol for weakness, says he never had any episodes so went back on 1 whole tablet of toprol.  rec Follow med calendar closely and bring to each visit.  Please contact office for sooner follow up if symptoms do not improve or worsen or seek emergency care   Follow up Dr. [name]  In 3 months and As needed.    09/21/2014 f/u ov/Mahesh Sizemore re: GOLD III  Chief Complaint  Patient presents with  . Follow-up    Pt c/o increased SOB with activity, occ chest tightness, prod cough early mornings with white mucus. Constant sinus drainage and nose bleed on 09/19/14.    Doe better until 3 weeks prior to OV  Then worse sob assoc with nasal congestion off nasonex  - note also stopped omeprazole "I don't have reflux" - see Dg  Es below rec Restart dymista one twice daily  Continue prednisone 20 mg daily until better  then taper  See calendar for specific medication instructions   10/19/2014  f/u ov/Omesha Bowerman re: GOLD III / maint rx symbicort/ spiriva Chief Complaint  Patient presents with  . Follow-up    Pt c/o fatigue and increased chest congestion for  the past several wks. He has am cough with white sputum. He is using xopenex 2 x per day on average.    on 10/8 started pred 20 and zpak as per med calendar action plan  gxt flat x 49mph x 15 min and zero grade on 2lpm with sats mid 90s at home  >decrease pred 5mg    11/19/2014 NP Follow up : COPD GOLD III  Returns for follow up .  Says he had a flare 4 days ago with increased SOB, occasional dry cough, chest tightness. Sinus pressure and drainage. Denies fever, wheezing, nausea or vomiting. Started on Zpack and prednisone 20mg  yesterday .  Feels better   rec Follow med calendar closely and bring to each visit.  Finish Zpack as directed.  Taper prednisone and hold at 5mg  daily  Please contact office for sooner follow up if symptoms do not improve or worsen or seek emergency care     02/19/2015  f/u ov/Charlottie Peragine re: copd GOLD III symb/ spiriva  And 02 2lpm 24/7  Chief Complaint  Patient presents with  . Follow-up    Pt c/o increasing SOB even without exertion and some occasional cough. Pt is still on O2/2L. Pt also c/o fatigue, increased sleep, loss of appetite, irregular pulse. Pt states that when he wakes up he "doesn't feel like he will make it through the day".    on gxt x  2lpm continuous x 80mph on gxt x 15 m at flat grade   .No obvious day to day or daytime variability or assoc excess/ purulent sputum or mucus plugs or hemoptysis or cp or chest tightness, subjective wheeze or overt sinus or hb symptoms. No unusual exp hx or h/o childhood pna/ asthma or knowledge of premature birth.  Sleeping ok without nocturnal  or early am exacerbation  of respiratory  c/o's or need for noct saba. Also denies any obvious fluctuation of symptoms with weather or environmental changes or other aggravating or alleviating factors except as outlined above   Current Medications, Allergies, Complete Past Medical History, Past Surgical History, Family History, and Social History were reviewed in [facility] record.  ROS  The following are not active complaints unless bolded sore throat, dysphagia, dental problems, itching, sneezing,  nasal congestion or excess/ purulent secretions, ear ache,   fever, chills, sweats, unintended wt loss, classically pleuritic or exertional cp,  orthopnea pnd or leg swelling, presyncope, palpitations, abdominal pain, anorexia, nausea, vomiting, diarrhea  or change in bowel or bladder habits, change in stools or urine, dysuria,hematuria,  rash, arthralgias, visual complaints, headache, numbness, weakness or ataxia or problems with walking or coordination,  change in mood/affect or memory.                           Past Medical History:  CHRONIC RHINITIS (ICD-472.0)  - Sinus ct March 03, 2010 > thickening only, no acute changes  CHRONIC RESPIRATORY FAILURE (ICD-518.83)  - 02 rx since 2008  COPD UNSPECIFIED (ICD-496)....................................................................[name]  - PFTs 05/04/05 FEV1 36% ratio 28% with 29% improvement after bronchodilators DLCO 48%  - PFT's 03/26/08 30% ratio 34 with 14% improvement after bronchodilators DLC0 38 %  - Start noct 02 at 2lpm 2008 and on 03/04/09 desat @ > 185 ft so rec wear with activtiy > rm to rm  - [facility] June 30, 2008 100% > confirmed March 04, 2009  ISCHEMIC HEART DISEASE (ICD-414.9)  HYPERTENSION (ICD-401.9)  HYPERLIPIDEMIA (ICD-272.4)  SPN RUL by cxr August 19, 2009  COMPLEX MED REGIMEN-Meds reviewed with pt education and computerized med calendar 11/22/09 03/10/2011  , 10/15/2012  07/17/2013 , 12/09/2013 , 06/15/2014 , 11/19/14           Objective:   Physical Exam  amb wm nad with nl vitals / hopeless affect / vital signs reviewed   wt 159 04/28/2010  > 10/14/2010 154  > 10/04/2011 150> 12/21/2011  150 > 149 01/29/2012 >154 04/05/2012 > 05/22/2012 148 > 09/30/2012  146 >>144> 10/15/2012 > 12/18/2012  145 > 148 02/14/2013 >148 03/14/2013 > 04/25/2013 148 > 06/19/2013 150 >>07/17/2013 >  08/27/2013 148 > 10/23/2013  148 >153 12/09/2013 > 02/26/2014  153 > 06/01/2014  152 > 157 06/15/2014 > 09/21/2014 146 > 10/19/2014 151 >>148 11/19/14 > 02/24/2015 148  HEENT  mod nonspecific turbinate edema.  Oropharynx no thrush or excess pnd or cobblestoning.  No JVD or cervical adenopathy. Mild accessory muscle hypertrophy. Trachea midline, nl thryroid. Chest was hyperinflated by percussion with diminished breath sounds and moderate increased exp time without wheeze.   Regular rate and rhythm without murmur gallop or rub or increase P2 -  pitting bilateral lower ext edema R > L .   Abd: no hsm, nl excursion. Ext warm without cyanosis or clubbing.         DgEs  04/28/14 Mild gastroesophageal reflux. No hiatal hernia.  Assessment & Plan:

## 2015-02-24 ENCOUNTER — Encounter: Payer: Self-pay | Admitting: Internal Medicine

## 2015-02-24 DIAGNOSIS — R5381 Other malaise: Secondary | ICD-10-CM | POA: Diagnosis not present

## 2015-02-24 DIAGNOSIS — I10 Essential (primary) hypertension: Secondary | ICD-10-CM | POA: Diagnosis not present

## 2015-02-24 DIAGNOSIS — E559 Vitamin D deficiency, unspecified: Secondary | ICD-10-CM | POA: Diagnosis not present

## 2015-02-24 DIAGNOSIS — F419 Anxiety disorder, unspecified: Secondary | ICD-10-CM | POA: Diagnosis not present

## 2015-02-24 DIAGNOSIS — R1013 Epigastric pain: Secondary | ICD-10-CM | POA: Diagnosis not present

## 2015-02-24 DIAGNOSIS — R609 Edema, unspecified: Secondary | ICD-10-CM | POA: Diagnosis not present

## 2015-02-24 NOTE — Assessment & Plan Note (Signed)
-   Start noct 02 at 2lpm 2008 and on 03/04/09 desat @ > 185 ft so rec wear with activtiy > rm to rm  - 02/15/2011   Walked RA x one lap @ 185 stopped due to  desat > corrected on 2lpm - 02/26/2014  Walked 2lpm  2 laps @ 185 ft each stopped due to  Sob/ sats 88% at nl pace  - 10/19/2014   Walked RA  2 laps @ 185 ft each stopped due to  End of study, slow pace,  sob  But no  desat    rx as of 02/19/2015  = 2lpm 24/7 unless monitoring 02 sats with goal of > 90% at all times

## 2015-02-24 NOTE — Assessment & Plan Note (Signed)
-   PFTs 05/04/05 FEV1 36% ratio 28% with 29% improvement after bronchodilators DLCO 48%  - PFT's 03/26/08 30% ratio 34 with 14% improvement after bronchodilators DLC0 38 %  - Referred to rehab 12/19/2012 > completed March 2015  - changed to spiriva respimat 02/14/2013  - started daily prednisone 06/19/13 > improved only a little> tapered off mid July 2015 > ok to change to prn prednisone 08/27/2013  - flared off nasonex and gerd rx 09/21/2014 > resumed - 10/19/2014  p extensive coaching HFA effectiveness =    90%  - changed pred to ceiling of 20 / floor of 5 mg daily as of 10/19/2014   Still able to do treadmill abeit at slow rate/ he is relatively well compensated but quite severe copd approaching end stage as even daily prednisone not helping much   I had an extended discussion with the patient reviewing all relevant studies completed to date and  lasting 15 to 20 minutes of a 25 minute visit    Each maintenance medication was reviewed in detail including most importantly the difference between maintenance and prns and under what circumstances the prns are to be triggered using an action plan format that is not reflected in the computer generated alphabetically organized AVS but trather by a customized med calendar that reflects the AVS meds with confirmed 100% correlation.   Please see instructions for details which were reviewed in writing and the patient given a copy highlighting the part that I personally wrote and discussed at today's ov.

## 2015-03-01 DIAGNOSIS — R002 Palpitations: Secondary | ICD-10-CM | POA: Diagnosis not present

## 2015-03-01 DIAGNOSIS — I4949 Other premature depolarization: Secondary | ICD-10-CM | POA: Diagnosis not present

## 2015-03-01 DIAGNOSIS — I739 Peripheral vascular disease, unspecified: Secondary | ICD-10-CM | POA: Diagnosis not present

## 2015-03-01 DIAGNOSIS — I6523 Occlusion and stenosis of bilateral carotid arteries: Secondary | ICD-10-CM | POA: Diagnosis not present

## 2015-03-01 DIAGNOSIS — E785 Hyperlipidemia, unspecified: Secondary | ICD-10-CM | POA: Diagnosis not present

## 2015-03-01 DIAGNOSIS — I872 Venous insufficiency (chronic) (peripheral): Secondary | ICD-10-CM | POA: Diagnosis not present

## 2015-03-01 DIAGNOSIS — I119 Hypertensive heart disease without heart failure: Secondary | ICD-10-CM | POA: Diagnosis not present

## 2015-03-01 DIAGNOSIS — R0602 Shortness of breath: Secondary | ICD-10-CM | POA: Diagnosis not present

## 2015-03-01 DIAGNOSIS — I2510 Atherosclerotic heart disease of native coronary artery without angina pectoris: Secondary | ICD-10-CM | POA: Diagnosis not present

## 2015-03-01 DIAGNOSIS — I2781 Cor pulmonale (chronic): Secondary | ICD-10-CM | POA: Diagnosis not present

## 2015-03-01 DIAGNOSIS — J449 Chronic obstructive pulmonary disease, unspecified: Secondary | ICD-10-CM | POA: Diagnosis not present

## 2015-03-01 DIAGNOSIS — R0609 Other forms of dyspnea: Secondary | ICD-10-CM | POA: Diagnosis not present

## 2015-03-03 DIAGNOSIS — L409 Psoriasis, unspecified: Secondary | ICD-10-CM | POA: Diagnosis not present

## 2015-03-08 ENCOUNTER — Other Ambulatory Visit: Payer: Self-pay | Admitting: Internal Medicine

## 2015-03-17 ENCOUNTER — Other Ambulatory Visit: Payer: Self-pay | Admitting: Adult Health

## 2015-03-18 ENCOUNTER — Ambulatory Visit (INDEPENDENT_AMBULATORY_CARE_PROVIDER_SITE_OTHER): Payer: Medicare Other | Admitting: Interventional Cardiology

## 2015-03-18 ENCOUNTER — Encounter: Payer: Self-pay | Admitting: Interventional Cardiology

## 2015-03-18 VITALS — BP 146/84 | HR 73 | Ht 69.0 in | Wt 145.6 lb

## 2015-03-18 DIAGNOSIS — I2609 Other pulmonary embolism with acute cor pulmonale: Secondary | ICD-10-CM

## 2015-03-18 DIAGNOSIS — I25810 Atherosclerosis of coronary artery bypass graft(s) without angina pectoris: Secondary | ICD-10-CM

## 2015-03-18 DIAGNOSIS — E785 Hyperlipidemia, unspecified: Secondary | ICD-10-CM | POA: Diagnosis not present

## 2015-03-18 DIAGNOSIS — I10 Essential (primary) hypertension: Secondary | ICD-10-CM | POA: Diagnosis not present

## 2015-03-18 DIAGNOSIS — I70213 Atherosclerosis of native arteries of extremities with intermittent claudication, bilateral legs: Secondary | ICD-10-CM | POA: Diagnosis not present

## 2015-03-18 DIAGNOSIS — G4733 Obstructive sleep apnea (adult) (pediatric): Secondary | ICD-10-CM | POA: Diagnosis not present

## 2015-03-18 DIAGNOSIS — I509 Heart failure, unspecified: Secondary | ICD-10-CM | POA: Insufficient documentation

## 2015-03-18 NOTE — Patient Instructions (Signed)
Medication Instructions:  Your physician recommends that you continue on your current medications as directed. Please refer to the Current Medication list given to you today.   Labwork: None ordered  Testing/Procedures: None ordered  Follow-Up: Your physician wants you to follow-up in: 1 year You will receive a reminder letter in the mail two months in advance. If you don't receive a letter, please call our office to schedule the follow-up appointment.   Any Other Special Instructions Will Be Listed Below (If Applicable).     If you need a refill on your cardiac medications before your next appointment, please call your pharmacy.   

## 2015-03-18 NOTE — Progress Notes (Signed)
Cardiology Office Note   Date:  03/18/2015   ID:  [name], DOB 1929-02-27, MRN [id number]  PCP:   Melinda Crutch, MD  Cardiologist:  Sinclair Grooms, MD   Chief Complaint  Patient presents with  . Coronary Artery Disease      History of Present Illness: RIVER SHOULTS is a 82 y.o. male who presents for CAD with prior coronary bypass grafting, COPD with cor pulmonale, hyperlipidemia, hypertension, PAD, and history of carotid obstructive disease.  [place]son is doing well. He is on chronic O2 therapy. He has decreased energy and dyspnea on exertion. He denies chest pain. He has had no significant episodes of tachycardia. No episodes of hemoptysis or abdominal swelling. He does have or extremity swelling. O2 therapy has been chronic now for greater than a year. He does not smoke. He has not needed to use nitroglycerin.    Past Medical History  Diagnosis Date  . Hypertension   . S/P CABG x 4   . CAD (coronary artery disease)   . COPD (chronic obstructive pulmonary disease) ([place])   . Hyperlipidemia   . Chronic rhinitis   . Chronic respiratory failure ([place])   . Peripheral vascular disease ([place])   . Upper respiratory infection 08/2012  . Asthma   . Syncope and collapse   . Carotid artery occlusion     Past Surgical History  Procedure Laterality Date  . Coronary artery bypass graft  2003  . Cataract extraction  11-20-11  . Lithotripsy    . Prostate biopsy  Oct. 2014     Current Outpatient Prescriptions  Medication Sig Dispense Refill  . ALPRAZolam (XANAX) 0.25 MG tablet TAKE 1 TABLET BY MOUTH EVERY 8 HOURS AS NEEDED FOR ANXIETY 30 tablet 0  . aspirin 325 MG tablet Take 1 tablet (325 mg total) by mouth every morning.    Marland Kitchen atorvastatin (LIPITOR) 20 MG tablet TAKE 1 TABLET BY MOUTH EVERY DAY 30 tablet 3  . Cholecalciferol (VITAMIN D3) 1000 UNITS CAPS Take 1 capsule (1,000 Units total) by mouth daily. (Patient taking differently: Take 1,000 Units by mouth every morning. )    .  finasteride (PROSCAR) 5 MG tablet Take 5 mg by mouth every morning.    . furosemide (LASIX) 20 MG tablet Take 1 tablet (20 mg total) by mouth daily as needed for fluid. (Patient taking differently: Take 20 mg by mouth as needed (leg swelling). ) 30 tablet 1  . guaiFENesin (MUCINEX) 600 MG 12 hr tablet Take 1-2 every 12 hours as needed for cough/congestion    . loratadine (CLARITIN) 10 MG tablet Take 10 mg by mouth daily as needed (drippy nose and drainage).     . metoprolol succinate (TOPROL XL) 25 MG 24 hr tablet Take 1 tablet (25 mg total) by mouth daily. 30 tablet 3  . mometasone (NASONEX) 50 MCG/ACT nasal spray Place 2 sprays into the nose daily. (Patient taking differently: Place 2 sprays into the nose every morning. ) 17 g 5  . Multiple Vitamin (MULTIVITAMIN) tablet Take 1 tablet by mouth daily.    . nitroGLYCERIN (NITROSTAT) 0.4 MG SL tablet DISSOLVE 1 TABLET UNDER THE TONGUE AS DIRECTED AND AS NEEDED FOR CHEST PAIN 25 tablet 0  . omeprazole (PRILOSEC) 40 MG capsule Take 1 capsule (40 mg total) by mouth 2 (two) times daily before a meal. 60 capsule 5  . OXYGEN 2 l/m at bedtime and as needed for shortness of breath    . predniSONE (  DELTASONE) 10 MG tablet TAKE 1/2 TABLET BY MOUTH DAILY 45 tablet 5  . SPIRIVA RESPIMAT 2.5 MCG/ACT AERS INHALE 2 PUFFS BY MOUTH EVERY DAY (Patient taking differently: INHALE 2 PUFFS BY MOUTH EVERY MORNING) 4 g 11  . SYMBICORT 160-4.5 MCG/ACT inhaler TAKE 2 PUFFS EVERY MORNING AND THEN TAKE ANOTHER 2 PUFFS ABOUT 12 HOURS LATER 10.2 g 11  . XOPENEX HFA 45 MCG/ACT inhaler INHALE 2 PUFFS BY MOUTH EVERY 4 HOURS AS NEEDED FOR WHEEZING OR SHORTNESS OF BREATH 15 g 1   No current facility-administered medications for this visit.    Allergies:   Sulfonamide derivatives; Cefdinir; and Levaquin    Social History:  The patient  reports that he quit smoking about 33 years ago. His smoking use included Cigarettes. He has a 66 pack-year smoking history. He has never used  smokeless tobacco. He reports that he drinks alcohol. He reports that he does not use illicit drugs.   Family History:  The patient's family history includes Allergies in his brother; Breast cancer in his mother; Cancer in his mother; Heart attack in his father; Heart disease in his brother, brother, father, and mother; Hyperlipidemia in his brother; Hypertension in his brother and mother; Other in his mother; Peripheral vascular disease in his brother; Stroke in his brother.    ROS:  Please see the history of present illness.   Otherwise, review of systems are positive for Depression, back discomfort, easy bruising, abdominal pain, vision disturbance, leg swelling bilaterally, exertional fatigue, and irregular heartbeat. Also has anxiety disorder and frequent urination at night..   All other systems are reviewed and negative.    PHYSICAL EXAM: VS:  BP 146/84 mmHg  Pulse 73  Ht 5\' 9"  (1.753 m)  Wt 145 lb 9.6 oz (66.044 kg)  BMI 21.49 kg/m2 , BMI Body mass index is 21.49 kg/(m^2). GEN: Well nourished, well developed, in no acute distress HEENT: normal Neck: no JVD, carotid bruits, or masses Cardiac: RRR.  There is no murmur, rub, or gallop. There is no edema. Respiratory:  clear to auscultation bilaterally, normal work of breathing. GI: soft, nontender, nondistended, + BS MS: no deformity or atrophy Skin: warm and dry, no rash Neuro:  Strength and sensation are intact Psych: euthymic mood, full affect   EKG:  EKG is ordered today. The ekg reveals normal sinus rhythm, left atrial abnormality, right bundle, right axis, otherwise unremarkable.   Recent Labs: No results found for requested labs within last 365 days.    Lipid Panel    Component Value Date/Time   CHOL 131 06/06/2013 1217   TRIG 55.0 06/06/2013 1217   HDL 62.40 06/06/2013 1217   CHOLHDL 2 06/06/2013 1217   VLDL 11.0 06/06/2013 1217   LDLCALC 58 06/06/2013 1217      Wt Readings from Last 3 Encounters:  03/18/15  145 lb 9.6 oz (66.044 kg)  02/19/15 148 lb (67.132 kg)  11/19/14 148 lb (67.132 kg)      Other studies Reviewed: Additional studies/ records that were reviewed today include: Reviewed records. He has had cardiac evaluation by Dr. Wynonia Lawman because he couldn't get in to see me in a timely fashion. Apparently an echocardiogram was performed. He was told that his heart is enlarged.. The findings include unable to find the monitor or echocardiogram done by Dr. Wynonia Lawman..    ASSESSMENT AND PLAN:  1. Coronary artery disease involving coronary bypass graft of native heart without angina pectoris Stable without angina  2. COPD with cor pulmonale  Peripheral edema and abnormal echocardiogram suggesting right heart failure. I have not been able to review the data.  3. Hyperlipidemia On therapy  4. Essential hypertension Controlled  5. Atherosclerosis of native artery of both lower extremities with intermittent claudication (HCC) Exertional lower extremity weakness possibly claudication equivalent    Current medicines are reviewed at length with the patient today.  The patient has the following concerns regarding medicines: None.  The following changes/actions have been instituted:    No active coronary symptoms and therefore no specific evaluation or change in management as needed. Labs/ tests ordered today include:  No orders of the defined types were placed in this encounter.     Disposition:   FU with HS in 1 year  Signed, Sinclair Grooms, MD  03/18/2015 5:00 PM    [address], [place], [place]  [address] Phone: [phone]; Fax: [phone]

## 2015-03-29 ENCOUNTER — Telehealth: Payer: Self-pay | Admitting: Internal Medicine

## 2015-03-29 MED ORDER — PREDNISONE 10 MG PO TABS: ORAL_TABLET | ORAL | Status: DC

## 2015-03-29 NOTE — Telephone Encounter (Signed)
Spoke with the pt  He states needing new rx for pred to read 1 daily or as directed  When rx reads take 1/2 daily he only gets 15 tablets and is charged the whole copay  I have sent new rx and he is aware to just continue 5 mg daily  Nothing further needed

## 2015-04-07 ENCOUNTER — Telehealth: Payer: Self-pay

## 2015-04-07 NOTE — Telephone Encounter (Signed)
Left message for Dr.Tilley's nurse Juliann Pulse Pt was seen by Dr.Tilley previously, Dr.Smith would like to review a copy of an echo that was ordered by Dr.Tilley. Requested echo results be faxed to our office attn: Dr.Smith. Juliann Pulse is to call the office if any questions.

## 2015-04-15 ENCOUNTER — Encounter: Payer: Self-pay | Admitting: Internal Medicine

## 2015-04-15 ENCOUNTER — Ambulatory Visit: Payer: Medicare Other | Admitting: Internal Medicine

## 2015-04-15 ENCOUNTER — Other Ambulatory Visit (INDEPENDENT_AMBULATORY_CARE_PROVIDER_SITE_OTHER): Payer: Medicare Other

## 2015-04-15 ENCOUNTER — Ambulatory Visit (INDEPENDENT_AMBULATORY_CARE_PROVIDER_SITE_OTHER): Payer: Medicare Other | Admitting: Internal Medicine

## 2015-04-15 VITALS — BP 134/68 | HR 77 | Ht 69.0 in | Wt 144.6 lb

## 2015-04-15 DIAGNOSIS — I70213 Atherosclerosis of native arteries of extremities with intermittent claudication, bilateral legs: Secondary | ICD-10-CM

## 2015-04-15 DIAGNOSIS — J449 Chronic obstructive pulmonary disease, unspecified: Secondary | ICD-10-CM | POA: Diagnosis not present

## 2015-04-15 DIAGNOSIS — J310 Chronic rhinitis: Secondary | ICD-10-CM | POA: Diagnosis not present

## 2015-04-15 DIAGNOSIS — J9612 Chronic respiratory failure with hypercapnia: Secondary | ICD-10-CM

## 2015-04-15 DIAGNOSIS — R0600 Dyspnea, unspecified: Secondary | ICD-10-CM | POA: Diagnosis not present

## 2015-04-15 DIAGNOSIS — R0609 Other forms of dyspnea: Secondary | ICD-10-CM

## 2015-04-15 DIAGNOSIS — J9611 Chronic respiratory failure with hypoxia: Secondary | ICD-10-CM | POA: Diagnosis not present

## 2015-04-15 LAB — BASIC METABOLIC PANEL
BUN: 18 mg/dL (ref 6–23)
CO2: 35 mEq/L — ABNORMAL HIGH (ref 19–32)
Calcium: 10.3 mg/dL (ref 8.4–10.5)
Chloride: 101 mEq/L (ref 96–112)
Creatinine, Ser: 1.11 mg/dL (ref 0.40–1.50)
GFR: 67.13 mL/min (ref >60.00)
Glucose, Bld: 76 mg/dL (ref 70–99)
Potassium: 4.3 mEq/L (ref 3.5–5.1)
Sodium: 140 mEq/L (ref 135–145)

## 2015-04-15 LAB — CBC WITH DIFFERENTIAL/PLATELET
Basophils Absolute: 0 10*3/uL (ref 0.0–0.1)
Basophils Relative: 0.4 % (ref 0.0–3.0)
Eosinophils Absolute: 0.1 10*3/uL (ref 0.0–0.7)
Eosinophils Relative: 1.4 % (ref 0.0–5.0)
HCT: 39.1 % (ref 39.0–52.0)
Hemoglobin: 13.2 g/dL (ref 13.0–17.0)
Lymphocytes Relative: 13.5 % (ref 12.0–46.0)
Lymphs Abs: 1.2 10*3/uL (ref 0.7–4.0)
MCHC: 33.8 g/dL (ref 30.0–36.0)
MCV: 94.8 fl (ref 78.0–100.0)
Monocytes Absolute: 1.1 10*3/uL — ABNORMAL HIGH (ref 0.1–1.0)
Monocytes Relative: 12 % (ref 3.0–12.0)
Neutro Abs: 6.4 10*3/uL (ref 1.4–7.7)
Neutrophils Relative %: 72.7 % (ref 43.0–77.0)
Platelets: 224 10*3/uL (ref 150.0–400.0)
RBC: 4.13 Mil/uL — ABNORMAL LOW (ref 4.22–5.81)
RDW: 12.7 % (ref 11.5–15.5)
WBC: 8.8 10*3/uL (ref 4.0–10.5)

## 2015-04-15 LAB — MAGNESIUM: Magnesium: 2.1 mg/dL (ref 1.5–2.5)

## 2015-04-15 LAB — TSH: TSH: 3.44 u[IU]/mL (ref 0.35–4.50)

## 2015-04-15 LAB — BRAIN NATRIURETIC PEPTIDE: Pro B Natriuretic peptide (BNP): 71 pg/mL (ref 0.0–100.0)

## 2015-04-15 NOTE — Progress Notes (Signed)
Subjective:    Patient ID: [name], male    DOB: 1929-09-09   MRN: [id number]     Summary: Pulmonary/ f/u ov try astepro for pnds   Primary Provider/Referring Provider: [name]    Brief patient profile:  25  yowm last smoked 1983 with GOLD III COPD with some reversibility and 02 dep at hs and with activity    History of Present Illness   05/01/2011 f/u ov/Traci Gafford cc 50 ft outside, one aisle at [facility], whole store with 02. No cough and minimal need for saba Seen at [facility] rec change ipatropium neb  to atrovent > no change doe. rec ry tudorza one puff twice daily and stop atrovent and see how it effects your activity performance, if not benefit ok to change back to atrovent   10/04/2011 f/u ov/Ercole Georg did not bring med calendar cc  Loosing ground on tudorza so went to atrovent neb and seemed to help. rec Plan A = use automatically no matter what Symbicort followed by Atrovent each am and symbiocort again in 12 hours plus continue the atrovent though the day in between about every 4-6 hours Only use your albuterol (xopenex hfa -puffer-  is Plan B,  Nebulizer is plan C) .      12/18/2012 f/u ov/Damontae Loppnow re: copd/ 02  at bedtime and with exertion  Chief Complaint  Patient presents with  . Follow-up    Increased sob at all times x1 month.  using O2 more frequently   actually no sob at rest or at hs, wears 02 with activity and when he does wear it the sats are fine even when he has to stop due to sob rec Plan A= automatic  symbiocort and tudorza twice daily and ok to use your 02 more consistently with activity that you know makes you short of breath Plan B = backup > xopenex 2 every 4 if needed if can't catch your breath Plan C = xopenex neb every 4 hours if plan B not working, always try plan B first  Please see patient coordinator before you leave today  to schedule rehab  02/14/2013 f/u ov/Aariah Godette re:  Chief Complaint  Patient presents with  . Follow-up    Wants to make sure his medications  are correct.  Pt c/o increased SOB with exertion.  easily confused with details of care, only using hfa xopenex not the neb xopenex, times right before he goes to rehab and feels he's making slow progress.  >>changed Symbicort to [facility] and atrovent neb to spiriva respimat     04/25/2013 f/u ov/Varnika Butz re:  Chief Complaint  Patient presents with  . Acute Visit    Pt c/o increased DOE for the past 3 wks- worse for the past 4-5 days. He states that he is SOB with any exertion at all such as just raising up his arms. He also c/o nasal congestion for the past 3 months. He is using xopenex HFA at least twice per day.    incxrease doe assoc with prod of more mucus and nasal congestion  but better overnight and worse as day goes on, occ green mucus but mostly white No desats walking on 02 at rehab 2lpm rec Prednisone 10 mg take  4 each am x 2 days,   2 each am x 2 days,  1 each am x 2 days and stop  Follow med calendar    06/19/2013 f/u ov/Lejend Dalby re: GOLD III COPD with some reversibility on dulera 200 2bid and  spriva respimat  Chief Complaint  Patient presents with  . Follow-up    Pt c/o increased DOE for the past month. He states he gets SOB with walking approx 50 ft.    using xopenex qid never neb Did get def transient  benefit from prednisone  >steroid challenge   07/17/2013 Follow up and Med review  Returns for follow up and med review . We reviewed  All his meds and organized them into a med calendar w/ pt education.  Last visit was started on prednisone 20 mg with taper down to 10 mg and hold. Patient says that his breathing has improved. We discussed steroid use, and he wishes to taper off of this due to potential side effects.  rec Follow med calendar closely and bring to each visit.  Decrease prednisone 10mg  1/2 daily for 1 week then 1/2 every other day for 1 week and then stop.  May use saline nasal spray and gel As needed  For congestion .    08/27/2013 f/u ov/Jesusmanuel Erbes re: copd/ 02 dep  with sleep/ ex / on symbicort and spiriva  Chief Complaint  Patient presents with  . Follow-up    ROV : Pt c/o incr SOB and states that he has had a recent decrease in activity--does not feel like his stamina/endurance is where it needs to be.   Variable doe  seemed some better on prednisone - never sob at rest or sleeping  Wears 02 when sleeps at 2 and exercises at 2lpm on treadmill at 1.2 mph level, goes 5 min rest 1 min Has not tried pre treating with xopenex before he does the treadmill, never tried slow setting, never uses neb xopenex rec Try using your xopenex hfa  before planned walk on your treadmill to see what difference if any this makes Prevnar 13 given today Resume prednisone 10 mg x 2 daily when xopenx is not lasting 4 hours, then 1 daily x 5 days and stop      10/23/2013 f/u ov/Abiha Lukehart re: copd/02 dep on symb/spiriva/ xopenex prn plus back up pred short course Chief Complaint  Patient presents with  . Follow-up    SOB   feels prednisone helped a lot with sob and less need for saba Able do treadmill x 15 min x 1 mph no grade no  wheeze overt sinus or hb symptoms.   rec Try using your xopenex hfa  before planned walk on your treadmill to see what difference if any this makes prevnar 13 given today Resume prednisone 10 mg x 2 daily when xopenx is not lasting 4 hours, then 1 daily x 5 days and stop   02/26/2014 f/u ov/Yadriel Kerrigan re: GOLD III/ confused with action plan portion of med calendar  Chief Complaint  Patient presents with  . Follow-up    Pt c/o increased cough and SOB for the past 3 wks. His cough is worse in the am and is prod with white to light green sputum. He is using xopenex inhaler about 3-4 x per day and has not used neb.    treadmill x 15 min s stopping  x 1.2 mph flat/ on 02 2lpm with sats no lower than 93  Cough is day > noct, not flaring in am, no purulent sputum rec Goal is to keep sats over 90% with activity     06/01/2014 f/u ov/Anabell Swint re: COPD GOLD III/  symb/ spiriva respimat prednsione ? Dose / did not bring med calendar Chief Complaint  Patient presents  with  . Follow-up    Pt states his breathing is progressively worse since the last visit.    increased to 32 m on 02  1.2 mph, no tilt s stopping p 10 min to rest on 2lpm with adequate sats reported    some days  Prednisone 10 mg tapered but confused with instructions Drippy nose no better with flonase / clariton > has not tried 1st gen H1  Not much change using saba before ex  >>change claritin to chlortrimeton   06/15/2014 Follow up and Med review : COPD GOLD III/  Pt returns for follow up and med review  We reviewed all his meds and organized them into a med calendar with pt education  Appears to be taking meds correctly.  Last visit with copd flare , changed claritin to chlortabs but unable to tolerate during daytime Due to sleepiness.  Also to take 1/2 toprol for weakness, says he never had any episodes so went back on 1 whole tablet of toprol.  rec Follow med calendar closely and bring to each visit.  Please contact office for sooner follow up if symptoms do not improve or worsen or seek emergency care   Follow up Dr. [name]  In 3 months and As needed.    09/21/2014 f/u ov/Jonanthony Nahar re: GOLD III  Chief Complaint  Patient presents with  . Follow-up    Pt c/o increased SOB with activity, occ chest tightness, prod cough early mornings with white mucus. Constant sinus drainage and nose bleed on 09/19/14.    Doe better until 3 weeks prior to OV  Then worse sob assoc with nasal congestion off nasonex  - note also stopped omeprazole "I don't have reflux" - see Dg  Es below rec Restart dymista one twice daily  Continue prednisone 20 mg daily until better  then taper  See calendar for specific medication instructions   10/19/2014  f/u ov/Dyanna Seiter re: GOLD III / maint rx symbicort/ spiriva Chief Complaint  Patient presents with  . Follow-up    Pt c/o fatigue and increased chest congestion for  the past several wks. He has am cough with white sputum. He is using xopenex 2 x per day on average.    on 10/8 started pred 20 and zpak as per med calendar action plan  gxt flat x 49mph x 15 min and zero grade on 2lpm with sats mid 90s at home  >decrease pred 5mg    11/19/2014 NP Follow up : COPD GOLD III  Returns for follow up .  Says he had a flare 4 days ago with increased SOB, occasional dry cough, chest tightness. Sinus pressure and drainage. Denies fever, wheezing, nausea or vomiting. Started on Zpack and prednisone 20mg  yesterday .  Feels better   rec Follow med calendar closely and bring to each visit.  Finish Zpack as directed.  Taper prednisone and hold at 5mg  daily  Please contact office for sooner follow up if symptoms do not improve or worsen or seek emergency care     02/19/2015  f/u ov/Zaquan Duffner re: copd GOLD III symb/ spiriva  And 02 2lpm 24/7  Chief Complaint  Patient presents with  . Follow-up    Pt c/o increasing SOB even without exertion and some occasional cough. Pt is still on O2/2L. Pt also c/o fatigue, increased sleep, loss of appetite, irregular pulse. Pt states that when he wakes up he "doesn't feel like he will make it through the day".    on gxt x  2lpm continuous x 66mph on gxt x 15 m at flat grade s stopping  rec For significant swelling take furosemide 20 mg one daily as need as per your med calendar schedule    04/15/2015  f/u ov/Laurielle Selmon re: GOLD III/ hypercarbic/ maint rx symb and spiriva and pred floor of 10 mg and 02 lpm 24/7  Chief Complaint  Patient presents with  . Follow-up    Pt c/o increased nasal congestion and SOB x 2 wks. He is coughing more and producing minmal clear to white sputum.  He is using xopenex inhaler 2-3 x per day.        Wakes up feeling really lousy, mild ha and sluggish despite neg osa eval in past Not using flutter and quite congested in am as well  Doe = MMRC3 = can't walk 100 yards even at a slow pace at a flat grade s stopping due  to sob  Even on 02/ not using treadmill any more    .No obvious day to day or daytime variability or assoc excess/ purulent sputum or mucus plugs or hemoptysis or cp or chest tightness, subjective wheeze or overt sinus or hb symptoms. No unusual exp hx or h/o childhood pna/ asthma or knowledge of premature birth.  Sleeping ok without nocturnal  or early am exacerbation  of respiratory  c/o's or need for noct saba. Also denies any obvious fluctuation of symptoms with weather or environmental changes or other aggravating or alleviating factors except as outlined above   Current Medications, Allergies, Complete Past Medical History, Past Surgical History, Family History, and Social History were reviewed in [employer] record.  ROS  The following are not active complaints unless bolded sore throat, dysphagia, dental problems, itching, sneezing,  nasal congestion or excess/ purulent secretions, ear ache,   fever, chills, sweats, unintended wt loss, classically pleuritic or exertional cp,  orthopnea pnd or leg swelling, presyncope, palpitations, abdominal pain, anorexia, nausea, vomiting, diarrhea  or change in bowel or bladder habits, change in stools or urine, dysuria,hematuria,  rash, arthralgias, visual complaints, headache, numbness, weakness or ataxia or problems with walking or coordination,  change in mood/affect or memory.                           Past Medical History:  CHRONIC RHINITIS (ICD-472.0)  - Sinus ct March 03, 2010 > thickening only, no acute changes  CHRONIC RESPIRATORY FAILURE (ICD-518.83)  - 02 rx since 2008  COPD UNSPECIFIED (ICD-496)....................................................................[name]  - PFTs 05/04/05 FEV1 36% ratio 28% with 29% improvement after bronchodilators DLCO 48%  - PFT's 03/26/08 30% ratio 34 with 14% improvement after bronchodilators DLC0 38 %  - Start noct 02 at 2lpm 2008 and on 03/04/09 desat @ > 185 ft so rec wear  with activtiy > rm to rm  - [facility] June 30, 2008 100% > confirmed March 04, 2009  ISCHEMIC HEART DISEASE (ICD-414.9)  HYPERTENSION (ICD-401.9)  HYPERLIPIDEMIA (ICD-272.4)  SPN RUL by cxr August 19, 2009  COMPLEX MED REGIMEN-Meds reviewed with pt education and computerized med calendar 11/22/09 03/10/2011  , 10/15/2012  07/17/2013 , 12/09/2013 , 06/15/2014 , 11/19/14           Objective:   Physical Exam  amb wm nad with nl vitals / hopeless affect / vital signs reviewed / freq throat clearing   wt 159 04/28/2010  > 10/14/2010 154  > 10/04/2011 150> 12/21/2011  150 > 149 01/29/2012 >154 04/05/2012 >  05/22/2012 148 > 09/30/2012  146 >>144> 10/15/2012 > 12/18/2012  145 > 148 02/14/2013 >148 03/14/2013 > 04/25/2013 148 > 06/19/2013 150 >>07/17/2013 > 08/27/2013 148 > 10/23/2013  148 >153 12/09/2013 > 02/26/2014  153 > 06/01/2014  152 > 157 06/15/2014 > 09/21/2014 146 > 10/19/2014 151 >>148 11/19/14 > 02/24/2015 148  HEENT  mod nonspecific turbinate edema.  Oropharynx no thrush or excess pnd or cobblestoning.  No JVD or cervical adenopathy. Mild accessory muscle hypertrophy. Trachea midline, nl thryroid. Chest was hyperinflated by percussion with diminished breath sounds and moderate increased exp time without wheeze.   Regular rate and rhythm without murmur gallop or rub or increase P2 -  pitting bilateral lower ext edema R > L .   Abd: no hsm, nl excursion. Ext warm without cyanosis or clubbing.         DgEs  04/28/14 Mild gastroesophageal reflux. No hiatal hernia.   Labs ordered/ reviewed:      Chemistry      Component Value Date/Time   NA 140 04/15/2015 1118   K 4.3 04/15/2015 1118   CL 101 04/15/2015 1118   CO2 35* 04/15/2015 1118   BUN 18 04/15/2015 1118   CREATININE 1.11 04/15/2015 1118      Component Value Date/Time   CALCIUM 10.3 04/15/2015 1118   ALKPHOS 79 06/06/2013 1217   AST 23 06/06/2013 1217   ALT 15 06/06/2013 1217   BILITOT 0.6 06/06/2013 1217        Lab Results  Component Value Date    WBC 8.8 04/15/2015   HGB 13.2 04/15/2015   HCT 39.1 04/15/2015   MCV 94.8 04/15/2015   PLT 224.0 04/15/2015         Lab Results  Component Value Date   TSH 3.44 04/15/2015     Lab Results  Component Value Date   PROBNP 71.0 04/15/2015                 Assessment & Plan:

## 2015-04-15 NOTE — Progress Notes (Signed)
Quick Note:  Spoke with pt and notified of results per Dr. Wert. Pt verbalized understanding and denied any questions.  ______ 

## 2015-04-15 NOTE — Patient Instructions (Signed)
Always use nasonex at least one puff at bedtime   Use the flutter valve as much as possible   GERD (REFLUX)  is an extremely common cause of respiratory symptoms just like yours , many times with no obvious heartburn at all.    It can be treated with medication, but also with lifestyle changes including elevation of the head of your bed (ideally with 6 inch  bed blocks),  Smoking cessation, avoidance of late meals, excessive alcohol, and avoid fatty foods, chocolate, peppermint, colas, red wine, and acidic juices such as orange juice.  NO MINT OR MENTHOL PRODUCTS SO NO COUGH DROPS  USE SUGARLESS CANDY INSTEAD (Jolley ranchers or Stover's or Life Savers) or even ice chips will also do - the key is to swallow to prevent all throat clearing. NO OIL BASED VITAMINS - use powdered substitutes.   Please remember to go to the lab department downstairs for your tests - we will call you with the results when they are available.    Set appt to Dr Murlean Iba re your sleep issues

## 2015-04-16 NOTE — Assessment & Plan Note (Signed)
-   Start noct 02 at 2lpm 2008 and on 03/04/09 desat @ > 185 ft so rec wear with activtiy > rm to rm  - 02/15/2011   Walked RA x one lap @ 185 stopped due to  desat > corrected on 2lpm - 02/26/2014  Walked 2lpm  2 laps @ 185 ft each stopped due to  Sob/ sats 88% at nl pace  - 10/19/2014   Walked RA  2 laps @ 185 ft each stopped due to  End of study, slow pace,  sob  But no  desat   - HCO3  04/15/2015   = 35  rx as of 04/15/2015  = 2lpm 24/7 unless monitoring 02 sats with goal of > 90% at all times   Referred to Dr DeDios

## 2015-04-16 NOTE — Assessment & Plan Note (Signed)
-   rec rechallenge with qnasal 12/21/11  - Trial of 1st gen h1 and hs h2 rec 05/23/2012  - Allergy profile 12/03/2012-negative-IgE 94.8     Food IgE profile-negative - rec rechallnge with 1st gen H1 06/01/14 > could not tolerate either 1st gen H1   nor Zyrtec so recommended go back to Claritin prn 10/19/2014    rec continuing at least one dose nasonex as per med calendar as a "floor" with option to increase to 2 bid prn flare

## 2015-04-16 NOTE — Assessment & Plan Note (Signed)
-   PFTs 05/04/05 FEV1 36% ratio 28% with 29% improvement after bronchodilators DLCO 48%  - PFT's 03/26/08 30% ratio 34 with 14% improvement after bronchodilators DLC0 38 %  - Referred to rehab 12/19/2012 > completed March 2015  - changed to spiriva respimat 02/14/2013  - started daily prednisone 06/19/13 > improved only a little> tapered off mid July 2015 > ok to change to prn prednisone 08/27/2013  - flared off nasonex and gerd rx 09/21/2014 > resumed - 10/19/2014  p extensive coaching HFA effectiveness =    90%  - changed pred to ceiling of 20 / floor of 5 mg daily as of 10/19/2014   I had an extended final summary discussion with the patient reviewing all relevant studies completed to date and  lasting 15 to 20 minutes of a 25 minute visit on the following issues:    Really nothing else to offer in terms of meds /02 and feel he's approaching palliative care vs NIV at hs and needs second opinion re latter as he does not have osa by prev criteria but is hypercarbic now > refer to Dr DeDios who has experience with NIV    Each maintenance medication was reviewed in detail including most importantly the difference between maintenance and as needed and under what circumstances the prns are to be used. This was done in the context of a medication calendar review which provided the patient with a user-friendly unambiguous mechanism for medication administration and reconciliation and provides an action plan for all active problems. It is critical that this be shown to every doctor  for modification during the office visit if necessary so the patient can use it as a working document.

## 2015-04-16 NOTE — Assessment & Plan Note (Signed)
See copd/ no evidence chf/anemia/ thyroid dz but suspect anxiety may be also playing a role here

## 2015-04-17 ENCOUNTER — Other Ambulatory Visit: Payer: Self-pay | Admitting: Interventional Cardiology

## 2015-04-20 DIAGNOSIS — F419 Anxiety disorder, unspecified: Secondary | ICD-10-CM | POA: Diagnosis not present

## 2015-04-20 DIAGNOSIS — K30 Functional dyspepsia: Secondary | ICD-10-CM | POA: Diagnosis not present

## 2015-04-20 DIAGNOSIS — R079 Chest pain, unspecified: Secondary | ICD-10-CM | POA: Diagnosis not present

## 2015-04-22 ENCOUNTER — Telehealth: Payer: Self-pay | Admitting: Internal Medicine

## 2015-04-22 NOTE — Telephone Encounter (Signed)
[phone], pt cb

## 2015-04-22 NOTE — Telephone Encounter (Signed)
LVM for pt to return call

## 2015-04-22 NOTE — Telephone Encounter (Signed)
SPOKE WITH MW AND HE RECOMMENDS PT GO DIRECTLY TO ED FOR EVALUATION AND TREATMENT. PLEASE ADVISE CALLER.

## 2015-04-22 NOTE — Telephone Encounter (Signed)
Pt called back and i gave him the message to go directly to er and he said he would.[name]

## 2015-04-22 NOTE — Telephone Encounter (Signed)
LMTCB

## 2015-04-29 ENCOUNTER — Other Ambulatory Visit: Payer: Self-pay | Admitting: Interventional Cardiology

## 2015-04-29 ENCOUNTER — Other Ambulatory Visit: Payer: Self-pay | Admitting: Internal Medicine

## 2015-05-12 ENCOUNTER — Other Ambulatory Visit: Payer: Self-pay | Admitting: Interventional Cardiology

## 2015-05-20 ENCOUNTER — Encounter: Payer: Self-pay | Admitting: Adult Health

## 2015-05-20 ENCOUNTER — Ambulatory Visit (INDEPENDENT_AMBULATORY_CARE_PROVIDER_SITE_OTHER): Payer: Medicare Other | Admitting: Adult Health

## 2015-05-20 VITALS — BP 148/66 | HR 79 | Temp 97.9°F | Ht 69.0 in | Wt 143.0 lb

## 2015-05-20 DIAGNOSIS — Z23 Encounter for immunization: Secondary | ICD-10-CM | POA: Diagnosis not present

## 2015-05-20 DIAGNOSIS — J9611 Chronic respiratory failure with hypoxia: Secondary | ICD-10-CM

## 2015-05-20 DIAGNOSIS — J9612 Chronic respiratory failure with hypercapnia: Secondary | ICD-10-CM | POA: Diagnosis not present

## 2015-05-20 DIAGNOSIS — J449 Chronic obstructive pulmonary disease, unspecified: Secondary | ICD-10-CM

## 2015-05-20 NOTE — Patient Instructions (Addendum)
Follow med calendar closely and bring to each visit.  Please contact office for sooner follow up if symptoms do not improve or worsen or seek emergency care   Saline nasal rinses as needed.  Saline nasal gel At bedtime  As needed   Follow up Dr. [name]  In 3-4 months and As needed.  Pneumovax vaccine today .

## 2015-05-21 ENCOUNTER — Encounter: Payer: Self-pay | Admitting: Pulmonary Disease

## 2015-05-21 ENCOUNTER — Ambulatory Visit (INDEPENDENT_AMBULATORY_CARE_PROVIDER_SITE_OTHER): Payer: Medicare Other | Admitting: Pulmonary Disease

## 2015-05-21 VITALS — BP 158/72 | HR 86 | Ht 69.0 in | Wt 142.0 lb

## 2015-05-21 DIAGNOSIS — J9612 Chronic respiratory failure with hypercapnia: Secondary | ICD-10-CM

## 2015-05-21 DIAGNOSIS — J449 Chronic obstructive pulmonary disease, unspecified: Secondary | ICD-10-CM

## 2015-05-21 DIAGNOSIS — I70213 Atherosclerosis of native arteries of extremities with intermittent claudication, bilateral legs: Secondary | ICD-10-CM | POA: Diagnosis not present

## 2015-05-21 DIAGNOSIS — I25810 Atherosclerosis of coronary artery bypass graft(s) without angina pectoris: Secondary | ICD-10-CM

## 2015-05-21 DIAGNOSIS — J9611 Chronic respiratory failure with hypoxia: Secondary | ICD-10-CM | POA: Diagnosis not present

## 2015-05-21 NOTE — Patient Instructions (Signed)
1. We will schedule you for a breathing test and blood gas. 2. We will call you next week regarding setting up for a non invasive ventilator. 3. We will schedule follow-up after you receive her machine.

## 2015-05-21 NOTE — Assessment & Plan Note (Signed)
Patient has CAD. Sees Dr. Tamala Julian. Relatively stable.

## 2015-05-21 NOTE — Assessment & Plan Note (Signed)
Patient has severe/very severe emphysema. Last PFT with an FEV1 of 36%. Diffusion capacity of 48%. (05/04/2005) Patient currently is now on oxygen 2 L 24 7. The last 6 months, patient's COPD has been very difficult to control with frequent exacerbation requiring frequent office visits, prednisone, antibiotics. He is also now on oxygen 2 L 24/7. Prior to this, he was just on oxygen 2 L with exertion.  Patient has had unplanned office visits the last 6 months or so. Patient has had at least 2 or more exacerbations requiring increased medication the last 6 months or so.  Patient with more dyspnea, difficulty sleeping and staying asleep at night, dyspnea at night, waking up unrefreshed in the morning.  He has had 2 sleep studies in the past which were both negative for sleep apnea.  The patient has a history of life-threatening emergent events resulting from his severe COPD. This patient requires access to  ventilator therapy 24 hours a day. The patient will require daily use at night and for portions of the day. The severity of this patient's COPD is such that failure to provide noninvasive ventilator therapy on a daily basis will quickly lead to severe injury or death from the resulting CO2 retention without this level of respiratory support.  Plan for NIV. Likely Astral.  We will facilitate acquisition of astral.  Patient will need repeat PFTs and ABG prior to obtaining astral. We will touch base with the patient a week or so from obtaining the astral. Patient will also give [place] a call if he has in her back within the next week or so.

## 2015-05-21 NOTE — Assessment & Plan Note (Signed)
Patient's medications were reviewed today and patient education was given. Computerized medication calendar was adjusted/completed   Plan  follow med calendar closely and bring to each visit.  Please contact office for sooner follow up if symptoms do not improve or worsen or seek emergency care   Saline nasal rinses as needed.  Saline nasal gel At bedtime  As needed   Follow up Dr. [name]  In 3-4 months and As needed.  Pneumovax vaccine today .

## 2015-05-21 NOTE — Assessment & Plan Note (Signed)
Patient on oxygen 2 L 24 7. Prior to 6 months ago, he was just on 2 L when necessary. We'll need repeat ABG to qualify for astral. ABG done in 2012 showed CO2 of 48, unknown FiO2.

## 2015-05-21 NOTE — Assessment & Plan Note (Signed)
Continue on O2 

## 2015-05-21 NOTE — Progress Notes (Signed)
Subjective:    Patient ID: [name], male    DOB: May 08, 1929, 82 y.o.   MRN: [id number]  HPI  Pt is being seen regularly by MW for very severe/severe COPD, chronic hypoxemic resp failure.  Recently seen by MW abd was treated for aecopd. Was on higher pred but tapered down to usual dose.   Pt has had 2 sleep studies. Last study was 03/2005 and AHI was <1.   The last 6 mos, pt's COPD has been difficult to control. Pt with more frequent flare ups requiring higher prednisone +/- Abx. Pt has also been seeing/calling Dr. [name].   Also on 2L 24/7 the last 6 mos.   Gets 8-9hrs/night. No  No witnessed apneas. , gasping, choking.  Wakes up very tired.  Naps every now and then.  Occasional jerking of legs at sleep.   Follows with Cardiology. Sees Dr. Tamala Julian for CAD. CAD has been stable.    Review of Systems  Constitutional: Positive for unexpected weight change. Negative for fever.  HENT: Positive for congestion, nosebleeds, postnasal drip and sinus pressure. Negative for dental problem, ear pain, rhinorrhea, sneezing, sore throat and trouble swallowing.   Eyes: Negative.  Negative for redness and itching.  Respiratory: Positive for cough, chest tightness and shortness of breath. Negative for wheezing.   Cardiovascular: Positive for leg swelling. Negative for palpitations.  Gastrointestinal: Positive for nausea. Negative for vomiting.  Endocrine: Negative.   Genitourinary: Negative.  Negative for dysuria.  Musculoskeletal: Positive for myalgias and arthralgias. Negative for joint swelling.  Skin: Negative.  Negative for rash.  Allergic/Immunologic: Negative.   Neurological: Positive for light-headedness. Negative for headaches.  Hematological: Bruises/bleeds easily.  Psychiatric/Behavioral: Negative.  Negative for dysphoric mood. The patient is not nervous/anxious.    Past Medical History  Diagnosis Date  . Hypertension   . S/P CABG x 4   . CAD (coronary artery disease)   . COPD  (chronic obstructive pulmonary disease) ([place])   . Hyperlipidemia   . Chronic rhinitis   . Chronic respiratory failure ([place])   . Peripheral vascular disease ([place])   . Upper respiratory infection 08/2012  . Asthma   . Syncope and collapse   . Carotid artery occlusion    (-) CA, PVD   Family History  Problem Relation Age of Onset  . Heart disease Mother   . Breast cancer Mother   . Cancer Mother     Breast cancer  . Hypertension Mother   . Other Mother     AAA  and   Amputation  . Heart disease Father     Before age 27  . Heart attack Father   . Heart disease Brother   . Hyperlipidemia Brother   . Hypertension Brother   . Heart disease Brother   . Stroke Brother     x3, still living   . Peripheral vascular disease Brother   . Allergies Brother      Past Surgical History  Procedure Laterality Date  . Coronary artery bypass graft  2003  . Cataract extraction  11-20-11  . Lithotripsy    . Prostate biopsy  Oct. 2014    Social History   Social History  . Marital Status: Married    Spouse Name: N/A  . Number of Children: 2  . Years of Education: N/A   Occupational History  . 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 Smoking status: Former Smoker --  2.00 packs/day for 33 years    Types: Cigarettes    Quit date: 01/09/1982  . Smokeless tobacco: Never Used  . Alcohol Use: Yes     Comment: 1.5-3 oz daily  . Drug Use: No  . Sexual Activity: Not on file   Other Topics Concern  . Not on file   Social History Narrative   Pt lives at home with his spouse.           Allergies  Allergen Reactions  . Sulfonamide Derivatives Swelling    REACTION: facial/tongue swelling  . Cefdinir Diarrhea    REACTION: diarrhea  . Levaquin [Levofloxacin] Itching and Rash     Outpatient Prescriptions Prior to Visit  Medication Sig Dispense Refill  . ALPRAZolam (XANAX) 0.25 MG tablet TAKE 1 TABLET BY MOUTH EVERY 8 HOURS AS  NEEDED FOR ANXIETY 30 tablet 0  . aspirin 325 MG tablet Take 1 tablet (325 mg total) by mouth every morning.    Marland Kitchen atorvastatin (LIPITOR) 20 MG tablet TAKE 1 TABLET BY MOUTH EVERY DAY 90 tablet 3  . azithromycin (ZITHROMAX Z-PAK) 250 MG tablet Take 250 mg by mouth as directed. Reported on 04/15/2015    . azithromycin (ZITHROMAX) 250 MG tablet TAKE 2 TABLETS BY MOUTH ON DAY 1, THEN TAKE 1 TABLET DAILY FOR 4 DAYS 6 tablet 1  . Cholecalciferol (VITAMIN D3) 1000 UNITS CAPS Take 1 capsule (1,000 Units total) by mouth daily. (Patient taking differently: Take 1,000 Units by mouth every morning. )    . finasteride (PROSCAR) 5 MG tablet Take 5 mg by mouth every morning.    . furosemide (LASIX) 20 MG tablet Take 1 tablet (20 mg total) by mouth daily as needed for fluid. (Patient taking differently: Take 20 mg by mouth as needed (leg swelling). ) 30 tablet 1  . guaiFENesin (MUCINEX) 600 MG 12 hr tablet Take 1-2 every 12 hours as needed for cough/congestion    . loratadine (CLARITIN) 10 MG tablet Take 10 mg by mouth daily as needed (drippy nose and drainage).     . mometasone (NASONEX) 50 MCG/ACT nasal spray Place 2 sprays into the nose daily. (Patient taking differently: Place 2 sprays into the nose every morning. ) 17 g 5  . Multiple Vitamin (MULTIVITAMIN) tablet Take 1 tablet by mouth daily.    Marland Kitchen NITROSTAT 0.4 MG SL tablet DISSOLVE 1 TABLET UNDER THE TONGUE AS DIRECTED AND AS NEEDED FOR CHEST PAIN 25 tablet 5  . omeprazole (PRILOSEC) 40 MG capsule Take 1 capsule (40 mg total) by mouth 2 (two) times daily before a meal. 60 capsule 5  . OXYGEN 2 l/m at bedtime and as needed for shortness of breath    . predniSONE (DELTASONE) 10 MG tablet 1/2 to 1 tablet daily as directed 30 tablet 2  . SPIRIVA RESPIMAT 2.5 MCG/ACT AERS INHALE 2 PUFFS BY MOUTH EVERY DAY (Patient taking differently: INHALE 2 PUFFS BY MOUTH EVERY MORNING) 4 g 11  . SYMBICORT 160-4.5 MCG/ACT inhaler TAKE 2 PUFFS EVERY MORNING AND THEN TAKE ANOTHER 2  PUFFS ABOUT 12 HOURS LATER 10.2 g 11  . TOPROL XL 25 MG 24 hr tablet TAKE 1 TABLET(25 MG) BY MOUTH DAILY 30 tablet 10  . XOPENEX HFA 45 MCG/ACT inhaler INHALE 2 PUFFS BY MOUTH EVERY 4 HOURS AS NEEDED FOR WHEEZING OR SHORTNESS OF BREATH 15 g 1   No facility-administered medications prior to visit.   No orders of the defined types were placed in this encounter.  Objective:   Physical Exam   Vitals:  Filed Vitals:   05/21/15 1101  BP: 158/72  Pulse: 86  Height: 5\' 9"  (1.753 m)  Weight: 142 lb (64.411 kg)  SpO2: 95%    Constitutional/General:  Pleasant, well-nourished, well-developed, not in any distress,  Comfortably seating.  Well kempt chronically ill.   Body mass index is 20.96 kg/(m^2). Wt Readings from Last 3 Encounters:  05/21/15 142 lb (64.411 kg)  05/20/15 143 lb (64.864 kg)  04/15/15 144 lb 9.6 oz (65.59 kg)    Neck circumference: 15 in  HEENT: Pupils equal and reactive to light and accommodation. Anicteric sclerae. Normal nasal mucosa.   No oral  lesions,  mouth clear,  oropharynx clear, no postnasal drip. (-) Oral thrush. No dental caries.  Airway - Mallampati class III  Neck: No masses. Midline trachea. No JVD, (-) LAD. (-) bruits appreciated.  Respiratory/Chest: Grossly normal chest. (-) deformity. (-) Accessory muscle use.  Symmetric expansion. (-) Tenderness on palpation.  Resonant on percussion.  Diminished BS on both lower lung zones. (-) wheezing, crackles, rhonchi (-) egophony  Cardiovascular: Regular rate and  rhythm, heart sounds normal, no murmur or gallops, no peripheral edema  Gastrointestinal:  Normal bowel sounds. Soft, non-tender. No hepatosplenomegaly.  (-) masses.   Musculoskeletal:  Normal muscle tone. Normal gait.   Extremities: Grossly normal. (-) clubbing, cyanosis.  (-) edema  Skin: (-) rash,lesions seen.   Neurological/Psychiatric : alert, oriented to time, place, person. Normal mood and  affect           Assessment & Plan:  COPD  GOLD III Patient has severe/very severe emphysema. Last PFT with an FEV1 of 36%. Diffusion capacity of 48%. (05/04/2005) Patient currently is now on oxygen 2 L 24 7. The last 6 months, patient's COPD has been very difficult to control with frequent exacerbation requiring frequent office visits, prednisone, antibiotics. He is also now on oxygen 2 L 24/7. Prior to this, he was just on oxygen 2 L with exertion.  Patient has had unplanned office visits the last 6 months or so. Patient has had at least 2 or more exacerbations requiring increased medication the last 6 months or so.  Patient with more dyspnea, difficulty sleeping and staying asleep at night, dyspnea at night, waking up unrefreshed in the morning.  He has had 2 sleep studies in the past which were both negative for sleep apnea.  The patient has a history of life-threatening emergent events resulting from his severe COPD. This patient requires access to  ventilator therapy 24 hours a day. The patient will require daily use at night and for portions of the day. The severity of this patient's COPD is such that failure to provide noninvasive ventilator therapy on a daily basis will quickly lead to severe injury or death from the resulting CO2 retention without this level of respiratory support.  Plan for NIV. Likely Astral.  We will facilitate acquisition of astral.  Patient will need repeat PFTs and ABG prior to obtaining astral. We will touch base with the patient a week or so from obtaining the astral. Patient will also give [place] a call if he has in her back within the next week or so.  Chronic respiratory failure with hypoxia and hypercapnia (HCC) Patient on oxygen 2 L 24 7. Prior to 6 months ago, he was just on 2 L when necessary. We'll need repeat ABG to qualify for astral. ABG done in 2012 showed CO2 of 48, unknown FiO2.  CAD (coronary artery disease) Patient has CAD. Sees Dr.  Tamala Julian. Relatively stable.  Return to clinic in 3 mos >  He is scheduled to see Dr. [name] then. We'll touch base with him after he receives his noninvasive ventilator.     Monica Becton, MD 05/21/2015, 1:20 PM [facility] Pager [phone] After 3 pm or if no answer, call [phone]

## 2015-05-21 NOTE — Progress Notes (Signed)
Subjective:    Patient ID: [name], male    DOB: 07-18-1929   MRN: [id number]     Summary: Pulmonary/ f/u ov try astepro for pnds   Primary Provider/Referring Provider: [name]    Brief patient profile:  22  yowm last smoked 1983 with GOLD III COPD with some reversibility and 02 dep at hs and with activity    History of Present Illness   05/01/2011 f/u ov/Wert cc 50 ft outside, one aisle at [facility], whole store with 02. No cough and minimal need for saba Seen at [facility] rec change ipatropium neb  to atrovent > no change doe. rec ry tudorza one puff twice daily and stop atrovent and see how it effects your activity performance, if not benefit ok to change back to atrovent   10/04/2011 f/u ov/Wert did not bring med calendar cc  Loosing ground on tudorza so went to atrovent neb and seemed to help. rec Plan A = use automatically no matter what Symbicort followed by Atrovent each am and symbiocort again in 12 hours plus continue the atrovent though the day in between about every 4-6 hours Only use your albuterol (xopenex hfa -puffer-  is Plan B,  Nebulizer is plan C) .      12/18/2012 f/u ov/Wert re: copd/ 02  at bedtime and with exertion  Chief Complaint  Patient presents with  . Follow-up    Increased sob at all times x1 month.  using O2 more frequently   actually no sob at rest or at hs, wears 02 with activity and when he does wear it the sats are fine even when he has to stop due to sob rec Plan A= automatic  symbiocort and tudorza twice daily and ok to use your 02 more consistently with activity that you know makes you short of breath Plan B = backup > xopenex 2 every 4 if needed if can't catch your breath Plan C = xopenex neb every 4 hours if plan B not working, always try plan B first  Please see patient coordinator before you leave today  to schedule rehab  02/14/2013 f/u ov/Wert re:  Chief Complaint  Patient presents with  . Follow-up    Wants to make sure his medications  are correct.  Pt c/o increased SOB with exertion.  easily confused with details of care, only using hfa xopenex not the neb xopenex, times right before he goes to rehab and feels he's making slow progress.  >>changed Symbicort to [facility] and atrovent neb to spiriva respimat     04/25/2013 f/u ov/Wert re:  Chief Complaint  Patient presents with  . Acute Visit    Pt c/o increased DOE for the past 3 wks- worse for the past 4-5 days. He states that he is SOB with any exertion at all such as just raising up his arms. He also c/o nasal congestion for the past 3 months. He is using xopenex HFA at least twice per day.    incxrease doe assoc with prod of more mucus and nasal congestion  but better overnight and worse as day goes on, occ green mucus but mostly white No desats walking on 02 at rehab 2lpm rec Prednisone 10 mg take  4 each am x 2 days,   2 each am x 2 days,  1 each am x 2 days and stop  Follow med calendar    06/19/2013 f/u ov/Wert re: GOLD III COPD with some reversibility on dulera 200 2bid and  spriva respimat  Chief Complaint  Patient presents with  . Follow-up    Pt c/o increased DOE for the past month. He states he gets SOB with walking approx 50 ft.    using xopenex qid never neb Did get def transient  benefit from prednisone  >steroid challenge   07/17/2013 Follow up and Med review  Returns for follow up and med review . We reviewed  All his meds and organized them into a med calendar w/ pt education.  Last visit was started on prednisone 20 mg with taper down to 10 mg and hold. Patient says that his breathing has improved. We discussed steroid use, and he wishes to taper off of this due to potential side effects.  rec Follow med calendar closely and bring to each visit.  Decrease prednisone 10mg  1/2 daily for 1 week then 1/2 every other day for 1 week and then stop.  May use saline nasal spray and gel As needed  For congestion .    08/27/2013 f/u ov/Wert re: copd/ 02 dep  with sleep/ ex / on symbicort and spiriva  Chief Complaint  Patient presents with  . Follow-up    ROV : Pt c/o incr SOB and states that he has had a recent decrease in activity--does not feel like his stamina/endurance is where it needs to be.   Variable doe  seemed some better on prednisone - never sob at rest or sleeping  Wears 02 when sleeps at 2 and exercises at 2lpm on treadmill at 1.2 mph level, goes 5 min rest 1 min Has not tried pre treating with xopenex before he does the treadmill, never tried slow setting, never uses neb xopenex rec Try using your xopenex hfa  before planned walk on your treadmill to see what difference if any this makes Prevnar 13 given today Resume prednisone 10 mg x 2 daily when xopenx is not lasting 4 hours, then 1 daily x 5 days and stop      10/23/2013 f/u ov/Wert re: copd/02 dep on symb/spiriva/ xopenex prn plus back up pred short course Chief Complaint  Patient presents with  . Follow-up    SOB   feels prednisone helped a lot with sob and less need for saba Able do treadmill x 15 min x 1 mph no grade no  wheeze overt sinus or hb symptoms.   rec Try using your xopenex hfa  before planned walk on your treadmill to see what difference if any this makes prevnar 13 given today Resume prednisone 10 mg x 2 daily when xopenx is not lasting 4 hours, then 1 daily x 5 days and stop   02/26/2014 f/u ov/Wert re: GOLD III/ confused with action plan portion of med calendar  Chief Complaint  Patient presents with  . Follow-up    Pt c/o increased cough and SOB for the past 3 wks. His cough is worse in the am and is prod with white to light green sputum. He is using xopenex inhaler about 3-4 x per day and has not used neb.    treadmill x 15 min s stopping  x 1.2 mph flat/ on 02 2lpm with sats no lower than 93  Cough is day > noct, not flaring in am, no purulent sputum rec Goal is to keep sats over 90% with activity     06/01/2014 f/u ov/Wert re: COPD GOLD III/  symb/ spiriva respimat prednsione ? Dose / did not bring med calendar Chief Complaint  Patient presents  with  . Follow-up    Pt states his breathing is progressively worse since the last visit.    increased to 70 m on 02  1.2 mph, no tilt s stopping p 10 min to rest on 2lpm with adequate sats reported    some days  Prednisone 10 mg tapered but confused with instructions Drippy nose no better with flonase / clariton > has not tried 1st gen H1  Not much change using saba before ex  >>change claritin to chlortrimeton   06/15/2014 Follow up and Med review : COPD GOLD III/  Pt returns for follow up and med review  We reviewed all his meds and organized them into a med calendar with pt education  Appears to be taking meds correctly.  Last visit with copd flare , changed claritin to chlortabs but unable to tolerate during daytime Due to sleepiness.  Also to take 1/2 toprol for weakness, says he never had any episodes so went back on 1 whole tablet of toprol.  rec Follow med calendar closely and bring to each visit.  Please contact office for sooner follow up if symptoms do not improve or worsen or seek emergency care   Follow up Dr. [name]  In 3 months and As needed.    09/21/2014 f/u ov/Wert re: GOLD III  Chief Complaint  Patient presents with  . Follow-up    Pt c/o increased SOB with activity, occ chest tightness, prod cough early mornings with white mucus. Constant sinus drainage and nose bleed on 09/19/14.    Doe better until 3 weeks prior to OV  Then worse sob assoc with nasal congestion off nasonex  - note also stopped omeprazole "I don't have reflux" - see Dg  Es below rec Restart dymista one twice daily  Continue prednisone 20 mg daily until better  then taper  See calendar for specific medication instructions   10/19/2014  f/u ov/Wert re: GOLD III / maint rx symbicort/ spiriva Chief Complaint  Patient presents with  . Follow-up    Pt c/o fatigue and increased chest congestion for  the past several wks. He has am cough with white sputum. He is using xopenex 2 x per day on average.    on 10/8 started pred 20 and zpak as per med calendar action plan  gxt flat x 32mph x 15 min and zero grade on 2lpm with sats mid 90s at home  >decrease pred 5mg    11/19/2014 NP Follow up : COPD GOLD III  Returns for follow up .  Says he had a flare 4 days ago with increased SOB, occasional dry cough, chest tightness. Sinus pressure and drainage. Denies fever, wheezing, nausea or vomiting. Started on Zpack and prednisone 20mg  yesterday .  Feels better   rec Follow med calendar closely and bring to each visit.  Finish Zpack as directed.  Taper prednisone and hold at 5mg  daily  Please contact office for sooner follow up if symptoms do not improve or worsen or seek emergency care     02/19/2015  f/u ov/Wert re: copd GOLD III symb/ spiriva  And 02 2lpm 24/7  Chief Complaint  Patient presents with  . Follow-up    Pt c/o increasing SOB even without exertion and some occasional cough. Pt is still on O2/2L. Pt also c/o fatigue, increased sleep, loss of appetite, irregular pulse. Pt states that when he wakes up he "doesn't feel like he will make it through the day".    on gxt x  2lpm continuous x 91mph on gxt x 15 m at flat grade s stopping  rec For significant swelling take furosemide 20 mg one daily as need as per your med calendar schedule    04/15/2015  f/u ov/Wert re: GOLD III/ hypercarbic/ maint rx symb and spiriva and pred floor of 10 mg and 02 lpm 24/7  Chief Complaint  Patient presents with  . Follow-up    Pt c/o increased nasal congestion and SOB x 2 wks. He is coughing more and producing minmal clear to white sputum.  He is using xopenex inhaler 2-3 x per day.        Wakes up feeling really lousy, mild ha and sluggish despite neg osa eval in past Not using flutter and quite congested in am as well  Doe = MMRC3 = can't walk 100 yards even at a slow pace at a flat grade s stopping due  to sob  Even on 02/ not using treadmill any more  >>cont current regimen   05/20/15 Follow up : GOLD III COPD /Hypercarbic RF on O2, chronic steroids at pred 10mg   Patient returns for a one-month follow-up. He is accompanied by his wife. Last visit. Patient continued to have progressive symptoms of shortness of breath, weakness and fatigue. Lab work showed a normal TSH, chronic hypercarbia with bicarbonate on the left) with 35. Normal BNP. And CBC . We reviewed all his medications and organized them into a medication calendar with patient education  appears to be taking his medications correctly. Complains of nasal stuffiness and postnasal drip. He denies any hemoptysis, chest pain, orthopnea, PND, increased leg swelling or discolored mucus.     Current Medications, Allergies, Complete Past Medical History, Past Surgical History, Family History, and Social History were reviewed in [employer] record.  ROS  The following are not active complaints unless bolded sore throat, dysphagia, dental problems, itching, sneezing,  nasal congestion or excess/ purulent secretions, ear ache,   fever, chills, sweats, unintended wt loss, classically pleuritic or exertional cp,  orthopnea pnd or leg swelling, presyncope, palpitations, abdominal pain, anorexia, nausea, vomiting, diarrhea  or change in bowel or bladder habits, change in stools or urine, dysuria,hematuria,  rash, arthralgias, visual complaints, headache, numbness, weakness or ataxia or problems with walking or coordination,  change in mood/affect or memory.                           Past Medical History:  CHRONIC RHINITIS (ICD-472.0)  - Sinus ct March 03, 2010 > thickening only, no acute changes  CHRONIC RESPIRATORY FAILURE (ICD-518.83)  - 02 rx since 2008  COPD UNSPECIFIED (ICD-496)....................................................................[name]  - PFTs 05/04/05 FEV1 36% ratio 28% with 29% improvement after  bronchodilators DLCO 48%  - PFT's 03/26/08 30% ratio 34 with 14% improvement after bronchodilators DLC0 38 %  - Start noct 02 at 2lpm 2008 and on 03/04/09 desat @ > 185 ft so rec wear with activtiy > rm to rm  - [facility] June 30, 2008 100% > confirmed March 04, 2009  ISCHEMIC HEART DISEASE (ICD-414.9)  HYPERTENSION (ICD-401.9)  HYPERLIPIDEMIA (ICD-272.4)  SPN RUL by cxr August 19, 2009  COMPLEX MED REGIMEN-Meds reviewed with pt education and computerized med calendar 11/22/09 03/10/2011  , 10/15/2012  07/17/2013 , 12/09/2013 , 06/15/2014 , 11/19/14 , 5.11.17          Objective:   Physical Exam  amb wm nad with nl vitals / vital signs reviewed  Filed Vitals:   05/20/15 1625  BP: 148/66  Pulse: 79  Temp: 97.9 F (36.6 C)  TempSrc: Oral  Height: 5\' 9"  (1.753 m)  Weight: 143 lb (64.864 kg)  SpO2: 98%     wt 159 04/28/2010  > 10/14/2010 154  > 10/04/2011 150> 12/21/2011  150 > 149 01/29/2012 >154 04/05/2012 > 05/22/2012 148 > 09/30/2012  146 >>144> 10/15/2012 > 12/18/2012  145 > 148 02/14/2013 >148 03/14/2013 > 04/25/2013 148 > 06/19/2013 150 >>07/17/2013 > 08/27/2013 148 > 10/23/2013  148 >153 12/09/2013 > 02/26/2014  153 > 06/01/2014  152 > 157 06/15/2014 > 09/21/2014 146 > 10/19/2014 151 >>148 11/19/14 > 02/24/2015 148  HEENT  mod nonspecific turbinate edema.  Oropharynx no thrush or excess pnd or cobblestoning.  No JVD or cervical adenopathy. Mild accessory muscle hypertrophy. Trachea midline, nl thryroid. Chest was hyperinflated by percussion with diminished breath sounds and moderate increased exp time without wheeze.   Regular rate and rhythm without murmur gallop or rub or increase P2 -  pitting bilateral lower ext edema R > L .   Abd: no hsm, nl excursion. Ext warm without cyanosis or clubbing.         DgEs  04/28/14 Mild gastroesophageal reflux. No hiatal hernia.   Labs ordered/ reviewed:      Chemistry      Component Value Date/Time   NA 140 04/15/2015 1118   K 4.3 04/15/2015 1118   CL 101  04/15/2015 1118   CO2 35* 04/15/2015 1118   BUN 18 04/15/2015 1118   CREATININE 1.11 04/15/2015 1118      Component Value Date/Time   CALCIUM 10.3 04/15/2015 1118   ALKPHOS 79 06/06/2013 1217   AST 23 06/06/2013 1217   ALT 15 06/06/2013 1217   BILITOT 0.6 06/06/2013 1217        Lab Results  Component Value Date   WBC 8.8 04/15/2015   HGB 13.2 04/15/2015   HCT 39.1 04/15/2015   MCV 94.8 04/15/2015   PLT 224.0 04/15/2015         Lab Results  Component Value Date   TSH 3.44 04/15/2015     Lab Results  Component Value Date   PROBNP 71.0 04/15/2015               Travis Derasmo NP-C  [facility]  05/20/15

## 2015-05-22 NOTE — Progress Notes (Signed)
Chart and office note reviewed in detail  > agree with a/p as outlined    

## 2015-05-24 NOTE — Addendum Note (Signed)
Addended by: Osa Craver on: 05/24/2015 03:56 PM   Modules accepted: Orders, Medications

## 2015-05-25 ENCOUNTER — Telehealth: Payer: Self-pay | Admitting: Pulmonary Disease

## 2015-05-25 NOTE — Telephone Encounter (Signed)
Sheena --  Any update on NIV/Astral on this pt we ordered from [facility] in [place]?  Thanks   AD

## 2015-05-26 NOTE — Telephone Encounter (Signed)
Per pt's referral note:  Referral Notes     Type Date User   General 05/21/2015 12:05 PM MCMICHAEL, SHERRY P        Note   Order will need to be faxed to [facility] at [phone] once we get results from ABG & PFT. Left vm for Baxter Flattery to call & sched with pt.       Pt has not scheduled yet. Will follow up with PCC's.

## 2015-05-26 NOTE — Telephone Encounter (Signed)
Ok. pls let the pt know that NIV is contingent on the PFTs. I was not able to stress that when I saw him. The sooner we get pfts, the sooner he gets his NIV.   Thanks!  AD

## 2015-05-27 ENCOUNTER — Other Ambulatory Visit: Payer: Self-pay | Admitting: Internal Medicine

## 2015-06-02 ENCOUNTER — Encounter (HOSPITAL_COMMUNITY): Payer: Medicare Other

## 2015-06-08 ENCOUNTER — Ambulatory Visit (HOSPITAL_COMMUNITY)
Admission: RE | Admit: 2015-06-08 | Discharge: 2015-06-08 | Disposition: A | Discharge status: Home / Self Care | Payer: Medicare Other | Source: Ambulatory Visit | Attending: Pulmonary Disease | Admitting: Pulmonary Disease

## 2015-06-08 DIAGNOSIS — J449 Chronic obstructive pulmonary disease, unspecified: Secondary | ICD-10-CM | POA: Diagnosis not present

## 2015-06-08 LAB — PULMONARY FUNCTION TEST
DL/VA % pred: 27 %
DL/VA: 1.24 ml/min/mmHg/L
DLCO cor % pred: 9 %
DLCO cor: 2.94 ml/min/mmHg
DLCO unc % pred: 9 %
DLCO unc: 2.79 ml/min/mmHg
FEF 25-75 Post: 0.2 L/sec
FEF 25-75 Pre: 0.17 L/sec
FEF2575-%Change-Post: 17 %
FEF2575-%Pred-Post: 11 %
FEF2575-%Pred-Pre: 10 %
FEV1-%Change-Post: 8 %
FEV1-%Pred-Post: 22 %
FEV1-%Pred-Pre: 20 %
FEV1-Post: 0.58 L
FEV1-Pre: 0.54 L
FEV1FVC-%Change-Post: -4 %
FEV1FVC-%Pred-Pre: 40 %
FEV6-%Change-Post: 9 %
FEV6-%Pred-Post: 44 %
FEV6-%Pred-Pre: 40 %
FEV6-Post: 1.52 L
FEV6-Pre: 1.39 L
FEV6FVC-%Change-Post: -3 %
FEV6FVC-%Pred-Post: 76 %
FEV6FVC-%Pred-Pre: 79 %
FVC-%Change-Post: 13 %
FVC-%Pred-Post: 58 %
FVC-%Pred-Pre: 51 %
FVC-Post: 2.15 L
FVC-Pre: 1.9 L
Post FEV1/FVC ratio: 27 %
Post FEV6/FVC ratio: 71 %
Pre FEV1/FVC ratio: 28 %
Pre FEV6/FVC Ratio: 73 %
RV % pred: 171 %
RV: 4.59 L
TLC % pred: 99 %
TLC: 6.87 L

## 2015-06-08 LAB — BLOOD GAS, ARTERIAL
Acid-Base Excess: 4.7 mmol/L — ABNORMAL HIGH (ref 0.0–2.0)
Bicarbonate: 29.9 mEq/L — ABNORMAL HIGH (ref 20.0–24.0)
Drawn by: 276051
O2 Content: 2 L/min
O2 Saturation: 96.4 %
Patient temperature: 98.6
TCO2: 26.7 mmol/L (ref 0–100)
pCO2 arterial: 48.6 mmHg — ABNORMAL HIGH (ref 35.0–45.0)
pH, Arterial: 7.406 (ref 7.350–7.450)
pO2, Arterial: 85.3 mmHg (ref 80.0–100.0)

## 2015-06-08 MED ORDER — ALBUTEROL SULFATE (2.5 MG/3ML) 0.083% IN NEBU
2.5000 mg | INHALATION_SOLUTION | Freq: Once | RESPIRATORY_TRACT | Status: AC
  Administered 2015-06-08: 2.5 mg via RESPIRATORY_TRACT

## 2015-06-10 DIAGNOSIS — R972 Elevated prostate specific antigen [PSA]: Secondary | ICD-10-CM | POA: Diagnosis not present

## 2015-06-10 DIAGNOSIS — R310 Gross hematuria: Secondary | ICD-10-CM | POA: Diagnosis not present

## 2015-06-16 ENCOUNTER — Other Ambulatory Visit: Payer: Self-pay | Admitting: *Deleted

## 2015-06-16 DIAGNOSIS — R310 Gross hematuria: Secondary | ICD-10-CM | POA: Diagnosis not present

## 2015-06-16 DIAGNOSIS — N200 Calculus of kidney: Secondary | ICD-10-CM | POA: Diagnosis not present

## 2015-06-16 DIAGNOSIS — I739 Peripheral vascular disease, unspecified: Secondary | ICD-10-CM

## 2015-06-16 DIAGNOSIS — I6523 Occlusion and stenosis of bilateral carotid arteries: Secondary | ICD-10-CM

## 2015-06-17 ENCOUNTER — Institutional Professional Consult (permissible substitution): Payer: Medicare Other | Admitting: Pulmonary Disease

## 2015-06-19 ENCOUNTER — Other Ambulatory Visit: Payer: Self-pay | Admitting: Internal Medicine

## 2015-06-21 ENCOUNTER — Encounter: Payer: Self-pay | Admitting: Family

## 2015-06-23 ENCOUNTER — Encounter: Payer: Self-pay | Admitting: Family

## 2015-06-23 ENCOUNTER — Ambulatory Visit (INDEPENDENT_AMBULATORY_CARE_PROVIDER_SITE_OTHER): Payer: Medicare Other | Admitting: Family

## 2015-06-23 ENCOUNTER — Ambulatory Visit (HOSPITAL_COMMUNITY)
Admission: RE | Admit: 2015-06-23 | Discharge: 2015-06-23 | Disposition: A | Discharge status: Home / Self Care | Payer: Medicare Other | Source: Ambulatory Visit | Attending: Family | Admitting: Family

## 2015-06-23 ENCOUNTER — Ambulatory Visit (INDEPENDENT_AMBULATORY_CARE_PROVIDER_SITE_OTHER)
Admission: RE | Admit: 2015-06-23 | Discharge: 2015-06-23 | Disposition: A | Discharge status: Home / Self Care | Payer: Medicare Other | Source: Ambulatory Visit | Attending: Family | Admitting: Family

## 2015-06-23 VITALS — BP 124/67 | HR 81 | Temp 98.3°F | Resp 18 | Ht 69.0 in | Wt 139.0 lb

## 2015-06-23 DIAGNOSIS — R938 Abnormal findings on diagnostic imaging of other specified body structures: Secondary | ICD-10-CM | POA: Diagnosis not present

## 2015-06-23 DIAGNOSIS — I6523 Occlusion and stenosis of bilateral carotid arteries: Secondary | ICD-10-CM

## 2015-06-23 DIAGNOSIS — I2510 Atherosclerotic heart disease of native coronary artery without angina pectoris: Secondary | ICD-10-CM | POA: Insufficient documentation

## 2015-06-23 DIAGNOSIS — I739 Peripheral vascular disease, unspecified: Secondary | ICD-10-CM

## 2015-06-23 DIAGNOSIS — Z87891 Personal history of nicotine dependence: Secondary | ICD-10-CM | POA: Diagnosis not present

## 2015-06-23 DIAGNOSIS — I10 Essential (primary) hypertension: Secondary | ICD-10-CM | POA: Insufficient documentation

## 2015-06-23 DIAGNOSIS — E785 Hyperlipidemia, unspecified: Secondary | ICD-10-CM | POA: Diagnosis not present

## 2015-06-23 DIAGNOSIS — R0989 Other specified symptoms and signs involving the circulatory and respiratory systems: Secondary | ICD-10-CM | POA: Diagnosis present

## 2015-06-23 DIAGNOSIS — I779 Disorder of arteries and arterioles, unspecified: Secondary | ICD-10-CM | POA: Diagnosis not present

## 2015-06-23 NOTE — Patient Instructions (Signed)
Peripheral Vascular Disease Peripheral vascular disease (PVD) is a disease of the blood vessels that are not part of your heart and brain. A simple term for PVD is poor circulation. In most cases, PVD narrows the blood vessels that carry blood from your heart to the rest of your body. This can result in a decreased supply of blood to your arms, legs, and internal organs, like your stomach or kidneys. However, it most often affects a person's lower legs and feet. There are two types of PVD.  Organic PVD. This is the more common type. It is caused by damage to the structure of blood vessels.  Functional PVD. This is caused by conditions that make blood vessels contract and tighten (spasm). Without treatment, PVD tends to get worse over time. PVD can also lead to acute ischemic limb. This is when an arm or limb suddenly has trouble getting enough blood. This is a medical emergency. CAUSES Each type of PVD has many different causes. The most common cause of PVD is buildup of a fatty material (plaque) inside of your arteries (atherosclerosis). Small amounts of plaque can break off from the walls of the blood vessels and become lodged in a smaller artery. This blocks blood flow and can cause acute ischemic limb. Other common causes of PVD include:  Blood clots that form inside of blood vessels.  Injuries to blood vessels.  Diseases that cause inflammation of blood vessels or cause blood vessel spasms.  Health behaviors and health history that increase your risk of developing PVD. RISK FACTORS  You may have a greater risk of PVD if you:  Have a family history of PVD.  Have certain medical conditions, including:  High cholesterol.  Diabetes.  High blood pressure (hypertension).  Coronary heart disease.  Past problems with blood clots.  Past injury, such as burns or a broken bone. These may have damaged blood vessels in your limbs.  Buerger disease. This is caused by inflamed blood  vessels in your hands and feet.  Some forms of arthritis.  Rare birth defects that affect the arteries in your legs.  Use tobacco.  Do not get enough exercise.  Are obese.  Are age 50 or older. SIGNS AND SYMPTOMS  PVD may cause many different symptoms. Your symptoms depend on what part of your body is not getting enough blood. Some common signs and symptoms include:  Cramps in your lower legs. This may be a symptom of poor leg circulation (claudication).  Pain and weakness in your legs while you are physically active that goes away when you rest (intermittent claudication).  Leg pain when at rest.  Leg numbness, tingling, or weakness.  Coldness in a leg or foot, especially when compared with the other leg.  Skin or hair changes. These can include:  Hair loss.  Shiny skin.  Pale or bluish skin.  Thick toenails.  Inability to get or maintain an erection (erectile dysfunction). People with PVD are more prone to developing ulcers and sores on their toes, feet, or legs. These may take longer than normal to heal. DIAGNOSIS Your health care provider may diagnose PVD from your signs and symptoms. The health care provider will also do a physical exam. You may have tests to find out what is causing your PVD and determine its severity. Tests may include:  Blood pressure recordings from your arms and legs and measurements of the strength of your pulses (pulse volume recordings).  Imaging studies using sound waves to take pictures of   the blood flow through your blood vessels (Doppler ultrasound).  Injecting a dye into your blood vessels before having imaging studies using:  X-rays (angiogram or arteriogram).  Computer-generated X-rays (CT angiogram).  A powerful electromagnetic field and a computer (magnetic resonance angiogram or MRA). TREATMENT Treatment for PVD depends on the cause of your condition and the severity of your symptoms. It also depends on your age. Underlying  causes need to be treated and controlled. These include long-lasting (chronic) conditions, such as diabetes, high cholesterol, and high blood pressure. You may need to first try making lifestyle changes and taking medicines. Surgery may be needed if these do not work. Lifestyle changes may include:  Quitting smoking.  Exercising regularly.  Following a low-fat, low-cholesterol diet. Medicines may include:  Blood thinners to prevent blood clots.  Medicines to improve blood flow.  Medicines to improve your blood cholesterol levels. Surgical procedures may include:  A procedure that uses an inflated balloon to open a blocked artery and improve blood flow (angioplasty).  A procedure to put in a tube (stent) to keep a blocked artery open (stent implant).  Surgery to reroute blood flow around a blocked artery (peripheral bypass surgery).  Surgery to remove dead tissue from an infected wound on the affected limb.  Amputation. This is surgical removal of the affected limb. This may be necessary in cases of acute ischemic limb that are not improved through medical or surgical treatments. HOME CARE INSTRUCTIONS  Take medicines only as directed by your health care provider.  Do not use any tobacco products, including cigarettes, chewing tobacco, or electronic cigarettes. If you need help quitting, ask your health care provider.  Lose weight if you are overweight, and maintain a healthy weight as directed by your health care provider.  Eat a diet that is low in fat and cholesterol. If you need help, ask your health care provider.  Exercise regularly. Ask your health care provider to suggest some good activities for you.  Use compression stockings or other mechanical devices as directed by your health care provider.  Take good care of your feet.  Wear comfortable shoes that fit well.  Check your feet often for any cuts or sores. SEEK MEDICAL CARE IF:  You have cramps in your legs  while walking.  You have leg pain when you are at rest.  You have coldness in a leg or foot.  Your skin changes.  You have erectile dysfunction.  You have cuts or sores on your feet that are not healing. SEEK IMMEDIATE MEDICAL CARE IF:  Your arm or leg turns cold and blue.  Your arms or legs become red, warm, swollen, painful, or numb.  You have chest pain or trouble breathing.  You suddenly have weakness in your face, arm, or leg.  You become very confused or lose the ability to speak.  You suddenly have a very bad headache or lose your vision.   This information is not intended to replace advice given to you by your health care provider. Make sure you discuss any questions you have with your health care provider.   Document Released: 02/03/2004 Document Revised: 01/16/2014 Document Reviewed: 06/05/2013 Elsevier Interactive Patient Education 2016 [employer]  

## 2015-06-23 NOTE — Progress Notes (Signed)
[facility] [place] HISTORY AND PHYSICAL   MRN : [id number]  History of Present Illness:   [name] is a 82 y.o. male patient of Dr. Scot Dock seen for annual carotid duplex for known stenosis. He was initially referred by his cardiologist for monitoring of his ICA's. Pt had a 2003 CABG x 4 vessels. He uses 2 liters portable O2 when walking and sleeping, has severe COPD. Pt was evaluated by a gastroenterologist re RUQ intermittent pain, Prilosec has helped.   Patient has not had previous carotid artery intervention.  Patient has Negative history of TIA or stroke symptom. The patient denies amaurosis fugax or monocular blindness. The patient denies facial drooping. Pt. denies hemiplegia. The patient denies receptive or expressive aphasia.  Arms feel weak, burning, and tingling; this is ongoing. He does not know of any c-spine issues.  Pt reports New Medical or Surgical History: denies  He walks most days of the week on his treadmill for 18 minutes, then his whole body feels tired, his legs do not feel any more tired than the rest of his body. Pt reports sudden onset right calf cramping only with walking on the treadmill after about 10 minutes; this started about a month ago, is actually improving to the point in which it is not noticeable; he walks 20 minutes on his treadmill daily, is limited by dyspnea. He has seen a neurologist for dizziness, states he was diagnosed with paroxysmal vertigo. He has ankle swelling later in the day, left worse than right, no ankle swelling in the morning, he has knee high compression hose, advised to donn before he gets OOB.  Weight on 05/14/13 was 147 lb, 139 lb today.  Pt Diabetic: No Pt smoker: former smoker, quit in 1983  Pt meds include: Statin : Yes ASA: Yes Other anticoagulants/antiplatelets: no   Current Outpatient Prescriptions  Medication Sig Dispense Refill  . ALPRAZolam (XANAX) 0.25 MG tablet TAKE 1  TABLET BY MOUTH EVERY 8 HOURS AS NEEDED FOR ANXIETY 30 tablet 0  . aspirin 81 MG tablet Take 81 mg by mouth daily.    Marland Kitchen atorvastatin (LIPITOR) 20 MG tablet TAKE 1 TABLET BY MOUTH EVERY DAY (Patient taking differently: Take 1 tablet by mouth at bedtime) 90 tablet 3  . azithromycin (ZITHROMAX Z-PAK) 250 MG tablet Take 250 mg by mouth as directed. Reported on 04/15/2015    . Cholecalciferol (VITAMIN D3) 1000 UNITS CAPS Take 1 capsule (1,000 Units total) by mouth daily. (Patient taking differently: Take 1,000 Units by mouth every morning. )    . finasteride (PROSCAR) 5 MG tablet Take 5 mg by mouth every morning.    . furosemide (LASIX) 20 MG tablet Take 1 tablet (20 mg total) by mouth daily as needed for fluid. (Patient taking differently: Take 20 mg by mouth as needed (leg swelling). ) 30 tablet 1  . loratadine (CLARITIN) 10 MG tablet Take 10 mg by mouth daily as needed (drippy nose and drainage). Reported on 05/24/2015    . Multiple Vitamin (MULTIVITAMIN) tablet Take 1 tablet by mouth daily.    Marland Kitchen NASONEX 50 MCG/ACT nasal spray USE 2 SPRAYS NASALLY DAILY 17 g 11  . NITROSTAT 0.4 MG SL tablet DISSOLVE 1 TABLET UNDER THE TONGUE AS DIRECTED AND AS NEEDED FOR CHEST PAIN 25 tablet 5  . omeprazole (PRILOSEC) 40 MG capsule TAKE 1 CAPSULE(40 MG) BY MOUTH TWICE DAILY BEFORE A MEAL 60 capsule 5  . OXYGEN 2 l/m at bedtime and as needed  for shortness of breath    . predniSONE (DELTASONE) 10 MG tablet 1/2 to 1 tablet daily as directed (Patient taking differently: Take 2 daily until better. 1tab for 5 days, then 1/2 tab daily for flare of cough and wheezing) 30 tablet 2  . SPIRIVA RESPIMAT 2.5 MCG/ACT AERS INHALE 2 PUFFS BY MOUTH EVERY DAY (Patient taking differently: INHALE 2 PUFFS BY MOUTH EVERY MORNING) 4 g 11  . SYMBICORT 160-4.5 MCG/ACT inhaler INHALE 2 PUFFS BY MOUTH EVERY MORNING AND THEN ANOTHER 2 PUFFS ABOUT 12 HOURS LATER 10.2 g 5  . TOPROL XL 25 MG 24 hr tablet TAKE 1 TABLET(25 MG) BY MOUTH DAILY 30 tablet 10   . XOPENEX HFA 45 MCG/ACT inhaler INHALE 2 PUFFS BY MOUTH EVERY 4 HOURS AS NEEDED FOR WHEEZING OR SHORTNESS OF BREATH (Patient taking differently: INHALE 2 PUFFS BY MOUTH EVERY 4 HOURS AS NEEDED FOR WHEEZING OR SHORTNESS OF BREATH ((PLAN B))) 15 g 1  . XOPENEX HFA 45 MCG/ACT inhaler INHALE 2 PUFFS BY MOUTH EVERY 4 HOURS AS NEEDED FOR WHEEZING OR SHORTNESS OF BREATH 15 g 2   No current facility-administered medications for this visit.    Past Medical History  Diagnosis Date  . Hypertension   . S/P CABG x 4   . CAD (coronary artery disease)   . COPD (chronic obstructive pulmonary disease) ([place])   . Hyperlipidemia   . Chronic rhinitis   . Chronic respiratory failure ([place])   . Peripheral vascular disease ([place])   . Upper respiratory infection 08/2012  . Asthma   . Syncope and collapse   . Carotid artery occlusion     Social History Social History  Substance Use Topics  . Smoking status: Former Smoker -- 2.00 packs/day for 33 years    Types: Cigarettes    Quit date: 01/09/1982  . Smokeless tobacco: Never Used  . Alcohol Use: 0.0 oz/week    0 Standard drinks or equivalent per week     Comment: 1.5-3 oz daily    Family History Family History  Problem Relation Age of Onset  . Heart disease Mother   . Breast cancer Mother   . Cancer Mother     Breast cancer  . Hypertension Mother   . Other Mother     AAA  and   Amputation  . Heart disease Father     Before age 82  . Heart attack Father   . Heart disease Brother   . Hyperlipidemia Brother   . Hypertension Brother   . Heart disease Brother   . Stroke Brother     x3, still living   . Peripheral vascular disease Brother   . Allergies Brother     Surgical History Past Surgical History  Procedure Laterality Date  . Coronary artery bypass graft  2003  . Cataract extraction  11-20-11  . Lithotripsy    . Prostate biopsy  Oct. 2014    Allergies  Allergen Reactions  . Sulfonamide Derivatives Swelling    REACTION:  facial/tongue swelling  . Cefdinir Diarrhea    REACTION: diarrhea  . Levaquin [Levofloxacin] Itching and Rash    Current Outpatient Prescriptions  Medication Sig Dispense Refill  . ALPRAZolam (XANAX) 0.25 MG tablet TAKE 1 TABLET BY MOUTH EVERY 8 HOURS AS NEEDED FOR ANXIETY 30 tablet 0  . aspirin 81 MG tablet Take 81 mg by mouth daily.    Marland Kitchen atorvastatin (LIPITOR) 20 MG tablet TAKE 1 TABLET BY MOUTH EVERY DAY (Patient taking differently: Take  1 tablet by mouth at bedtime) 90 tablet 3  . azithromycin (ZITHROMAX Z-PAK) 250 MG tablet Take 250 mg by mouth as directed. Reported on 04/15/2015    . Cholecalciferol (VITAMIN D3) 1000 UNITS CAPS Take 1 capsule (1,000 Units total) by mouth daily. (Patient taking differently: Take 1,000 Units by mouth every morning. )    . finasteride (PROSCAR) 5 MG tablet Take 5 mg by mouth every morning.    . furosemide (LASIX) 20 MG tablet Take 1 tablet (20 mg total) by mouth daily as needed for fluid. (Patient taking differently: Take 20 mg by mouth as needed (leg swelling). ) 30 tablet 1  . loratadine (CLARITIN) 10 MG tablet Take 10 mg by mouth daily as needed (drippy nose and drainage). Reported on 05/24/2015    . Multiple Vitamin (MULTIVITAMIN) tablet Take 1 tablet by mouth daily.    Marland Kitchen NASONEX 50 MCG/ACT nasal spray USE 2 SPRAYS NASALLY DAILY 17 g 11  . NITROSTAT 0.4 MG SL tablet DISSOLVE 1 TABLET UNDER THE TONGUE AS DIRECTED AND AS NEEDED FOR CHEST PAIN 25 tablet 5  . omeprazole (PRILOSEC) 40 MG capsule TAKE 1 CAPSULE(40 MG) BY MOUTH TWICE DAILY BEFORE A MEAL 60 capsule 5  . OXYGEN 2 l/m at bedtime and as needed for shortness of breath    . predniSONE (DELTASONE) 10 MG tablet 1/2 to 1 tablet daily as directed (Patient taking differently: Take 2 daily until better. 1tab for 5 days, then 1/2 tab daily for flare of cough and wheezing) 30 tablet 2  . SPIRIVA RESPIMAT 2.5 MCG/ACT AERS INHALE 2 PUFFS BY MOUTH EVERY DAY (Patient taking differently: INHALE 2 PUFFS BY MOUTH  EVERY MORNING) 4 g 11  . SYMBICORT 160-4.5 MCG/ACT inhaler INHALE 2 PUFFS BY MOUTH EVERY MORNING AND THEN ANOTHER 2 PUFFS ABOUT 12 HOURS LATER 10.2 g 5  . TOPROL XL 25 MG 24 hr tablet TAKE 1 TABLET(25 MG) BY MOUTH DAILY 30 tablet 10  . XOPENEX HFA 45 MCG/ACT inhaler INHALE 2 PUFFS BY MOUTH EVERY 4 HOURS AS NEEDED FOR WHEEZING OR SHORTNESS OF BREATH (Patient taking differently: INHALE 2 PUFFS BY MOUTH EVERY 4 HOURS AS NEEDED FOR WHEEZING OR SHORTNESS OF BREATH ((PLAN B))) 15 g 1  . XOPENEX HFA 45 MCG/ACT inhaler INHALE 2 PUFFS BY MOUTH EVERY 4 HOURS AS NEEDED FOR WHEEZING OR SHORTNESS OF BREATH 15 g 2   No current facility-administered medications for this visit.     REVIEW OF SYSTEMS: See HPI for pertinent positives and negatives.  Physical Examination Filed Vitals:   06/23/15 1508 06/23/15 1510  BP: 131/62 124/67  Pulse: 81 81  Temp: 98.3 F (36.8 C)   Resp: 18   Height: 5\' 9"  (1.753 m)   Weight: 139 lb (63.05 kg)   SpO2: 97%    Body mass index is 20.52 kg/(m^2).  General: WDWN thin male in NAD GAIT: normal Eyes: PERRLA Pulmonary: Mildly-labored at rest, limited air movement in all fields, no rales, no rhonchi, & no wheezing. He is using his portable oxygen dispenser, 2 L/[place]  Cardiac: regular rhythm, no detected murmur.  VASCULAR EXAM  Left Right  Carotid Bruits Negative Negative  RADIAL PULSE 2+ palpable 2+ palpable  AORTA Not palpable N/A  FEMORAL 1+ palpable 1+ palpable  POPLITEAL Not palpable Not palpable  POSTERIOR TIBIAL Not palpable Not palpable  DORSALIS PEDIS Not palpable Not palpable     Gastrointestinal: soft, nontender, BS WNL, no r/g, no palpable masses.  Musculoskeletal: Age appropriate muscle  atrophy/wasting. M/S 4/5 throughout, Extremities without ischemic changes. 1+pitting edema in left ankle.  Neurologic: A&O X 3;  Appropriate Affect, Speech is normal CN 2-12 intact except has some hearing loss, Motor exam as listed above.               Non-Invasive Vascular Imaging (06/23/2015):  CEREBROVASCULAR DUPLEX EVALUATION    INDICATION: Carotid artery disease    PREVIOUS INTERVENTION(S): None    DUPLEX EXAM: Carotid duplex    RIGHT  LEFT  Peak Systolic Velocities (cm/s) End Diastolic Velocities (cm/s) Plaque LOCATION Peak Systolic Velocities (cm/s) End Diastolic Velocities (cm/s) Plaque  96 12  CCA PROXIMAL 130 15   77 9  CCA MID 107 16   71 12  CCA DISTAL 97 13   124 5  ECA 158 4   221 51  ICA PROXIMAL 141 30   149 17  ICA MID 105 14   62 19  ICA DISTAL 75 22     2.8 ICA / CCA Ratio (PSV) 1.3  Antegrade Vertebral Flow Antegrade  - Brachial Systolic Pressure (mmHg) -  Triphasic Brachial Artery Waveforms Triphasic    Plaque Morphology:  HM = Homogeneous, HT = Heterogeneous, CP = Calcific Plaque, SP = Smooth Plaque, IP = Irregular Plaque     ADDITIONAL FINDINGS: Multiphasic subclavian arteries    IMPRESSION: 1. 40 - 59% right internal carotid artery stenosis, higher end of range 2. Less than 40% left internal carotid artery stenosis    Compared to the previous exam:  No significant change since exam of 06/18/2014    ABI (Date: 06/23/2015)  R: [place] (0.79, 06/18/14), DP: monophasic, PT: mono, TBI: 0.25 (0.31  L: 0.61 (0.73), DP: mono, PT: mono, TBI: 0.28 (0.37)   ASSESSMENT:  [name] is a 82 y.o. male who has no history of stroke or TIA.  Today's carotid Duplex suggests 40 - 59% right internal carotid artery stenosis, higher end of range Less than 40% left internal carotid artery stenosis. No significant change since exam of 06/18/2014.  He has known peripheral artery occlusive disease. His walking seems limited by his dyspnea and overall lowered functional status. He does not seem to have claudication symptoms per se, he develops generalized weakness after walking 18 minutes on  his treadmill. He uses supplemental oxygen via nasal canula continuously. He has no signs of ischemia in his feet/legs. ABI's today indicate non compressible vessels in right ankle, left ankle with evidence of moderate arterial occlusive disease, all monophasic waveforms, both TBI's are decreased from a year ago.  Face to face time with patient was 25 minutes. Over 50% of this time was spent on counseling and coordination of care.   PLAN:   Based on today's exam and non-invasive vascular lab results, the patient will follow up in 6 months with the following tests: ABI's. Carotid duplex in a year. Daily seated leg exercises advised and demonstrated.  Referral to podiatrist as pt has trouble trimming his toenails and he has severe PAD. I discussed in depth with the patient the nature of atherosclerosis, and emphasized the importance of maximal medical management including strict control of blood pressure, blood glucose, and lipid levels, obtaining regular exercise, and cessation of smoking.  The patient is aware that without maximal medical management the underlying atherosclerotic disease process will progress, limiting the benefit of any interventions.  The patient was given information about stroke prevention and what symptoms should prompt the patient to seek immediate medical care.  The  patient was given information about PAD including signs, symptoms, treatment, what symptoms should prompt the patient to seek immediate medical care, and risk reduction measures to take. Thank you for allowing [place] to participate in this patient's care.  Clemon Chambers, RN, MSN, FNP-C [facility] Office: [phone]  Clinic MD: Scot Dock  06/23/2015 3:20 PM

## 2015-06-24 ENCOUNTER — Telehealth: Payer: Self-pay | Admitting: Family

## 2015-06-24 NOTE — Telephone Encounter (Signed)
Spoke with patient - podiatrist appt [facility] [phone] on 07/09/15 at 10:45 am with Dr. Prudence Davidson. Patient verbalized understanding.

## 2015-06-25 DIAGNOSIS — R310 Gross hematuria: Secondary | ICD-10-CM | POA: Diagnosis not present

## 2015-06-25 DIAGNOSIS — N200 Calculus of kidney: Secondary | ICD-10-CM | POA: Diagnosis not present

## 2015-06-25 DIAGNOSIS — R972 Elevated prostate specific antigen [PSA]: Secondary | ICD-10-CM | POA: Diagnosis not present

## 2015-06-28 ENCOUNTER — Telehealth: Payer: Self-pay | Admitting: Internal Medicine

## 2015-06-28 NOTE — Telephone Encounter (Signed)
Received call from [facility] at Dr. Lyndal Rainbow office - [facility] requesting surgical clearance. Left message on Pam's voicemail to call back.

## 2015-06-28 NOTE — Telephone Encounter (Signed)
Form is in AD's look-at folder. He will not be in office until 7/6. LM for [facility] @ [facility] to make her aware.

## 2015-06-28 NOTE — Telephone Encounter (Signed)
I received and am placing in Dr Tennis Must Dios's look at per [facility] request since he is seeing the pt now  Thanks

## 2015-06-28 NOTE — Telephone Encounter (Signed)
Pam-[phone] ext [phone] calling back

## 2015-06-28 NOTE — Telephone Encounter (Signed)
Pam will be faxing the clearance form she has not scheduled the surgery yet pt will also need clearance from Cardiology so she will fax the form [phone] Ext [phone]

## 2015-06-28 NOTE — Telephone Encounter (Signed)
Left message for Pam to call back.

## 2015-06-28 NOTE — Telephone Encounter (Signed)
Routing to [place] to follow up/make sure form received.

## 2015-06-29 DIAGNOSIS — R202 Paresthesia of skin: Secondary | ICD-10-CM | POA: Diagnosis not present

## 2015-06-29 DIAGNOSIS — R531 Weakness: Secondary | ICD-10-CM | POA: Diagnosis not present

## 2015-06-30 ENCOUNTER — Encounter: Payer: Self-pay | Admitting: Internal Medicine

## 2015-06-30 ENCOUNTER — Ambulatory Visit (INDEPENDENT_AMBULATORY_CARE_PROVIDER_SITE_OTHER): Payer: Medicare Other | Admitting: Internal Medicine

## 2015-06-30 VITALS — BP 124/76 | HR 87 | Ht 69.0 in | Wt 139.0 lb

## 2015-06-30 DIAGNOSIS — J9612 Chronic respiratory failure with hypercapnia: Secondary | ICD-10-CM | POA: Diagnosis not present

## 2015-06-30 DIAGNOSIS — J9611 Chronic respiratory failure with hypoxia: Secondary | ICD-10-CM

## 2015-06-30 DIAGNOSIS — J449 Chronic obstructive pulmonary disease, unspecified: Secondary | ICD-10-CM | POA: Diagnosis not present

## 2015-06-30 DIAGNOSIS — I779 Disorder of arteries and arterioles, unspecified: Secondary | ICD-10-CM | POA: Diagnosis not present

## 2015-06-30 NOTE — Progress Notes (Signed)
Subjective:    Patient ID: [name], male    DOB: 07-01-1929   MRN: [id number]     Summary: Pulmonary/ f/u ov try astepro for pnds   Primary Provider/Referring Provider: [name]    Brief patient profile:  53  yowm last smoked 1983 with GOLD III COPD with some reversibility and 02 dep at hs and with activity    History of Present Illness   05/01/2011 f/u ov/[name] cc 50 ft outside, one aisle at [facility], whole store with 02. No cough and minimal need for saba Seen at [facility] rec change ipatropium neb  to atrovent > no change doe. rec ry tudorza one puff twice daily and stop atrovent and see how it effects your activity performance, if not benefit ok to change back to atrovent   10/04/2011 f/u ov/[name] did not bring med calendar cc  Loosing ground on tudorza so went to atrovent neb and seemed to help. rec Plan A = use automatically no matter what Symbicort followed by Atrovent each am and symbiocort again in 12 hours plus continue the atrovent though the day in between about every 4-6 hours Only use your albuterol (xopenex hfa -puffer-  is Plan B,  Nebulizer is plan C) .      12/18/2012 f/u ov/[name] re: copd/ 02  at bedtime and with exertion  Chief Complaint  Patient presents with  . Follow-up    Increased sob at all times x1 month.  using O2 more frequently   actually no sob at rest or at hs, wears 02 with activity and when he does wear it the sats are fine even when he has to stop due to sob rec Plan A= automatic  symbiocort and tudorza twice daily and ok to use your 02 more consistently with activity that you know makes you short of breath Plan B = backup > xopenex 2 every 4 if needed if can't catch your breath Plan C = xopenex neb every 4 hours if plan B not working, always try plan B first  Please see patient coordinator before you leave today  to schedule rehab  02/14/2013 f/u ov/[name] re:  Chief Complaint  Patient presents with  . Follow-up    Wants to make sure his medications  are correct.  Pt c/o increased SOB with exertion.  easily confused with details of care, only using hfa xopenex not the neb xopenex, times right before he goes to rehab and feels he's making slow progress.  >>changed Symbicort to [facility] and atrovent neb to spiriva respimat     04/25/2013 f/u ov/[name] re:  Chief Complaint  Patient presents with  . Acute Visit    Pt c/o increased DOE for the past 3 wks- worse for the past 4-5 days. He states that he is SOB with any exertion at all such as just raising up his arms. He also c/o nasal congestion for the past 3 months. He is using xopenex HFA at least twice per day.    incxrease doe assoc with prod of more mucus and nasal congestion  but better overnight and worse as day goes on, occ green mucus but mostly white No desats walking on 02 at rehab 2lpm rec Prednisone 10 mg take  4 each am x 2 days,   2 each am x 2 days,  1 each am x 2 days and stop  Follow med calendar    06/19/2013 f/u ov/[name] re: GOLD III COPD with some reversibility on dulera 200 2bid and  spriva respimat  Chief Complaint  Patient presents with  . Follow-up    Pt c/o increased DOE for the past month. He states he gets SOB with walking approx 50 ft.    using xopenex qid never neb Did get def transient  benefit from prednisone  >steroid challenge   07/17/2013 Follow up and Med review  Returns for follow up and med review . We reviewed  All his meds and organized them into a med calendar w/ pt education.  Last visit was started on prednisone 20 mg with taper down to 10 mg and hold. Patient says that his breathing has improved. We discussed steroid use, and he wishes to taper off of this due to potential side effects.  rec Follow med calendar closely and bring to each visit.  Decrease prednisone 10mg  1/2 daily for 1 week then 1/2 every other day for 1 week and then stop.  May use saline nasal spray and gel As needed  For congestion .    08/27/2013 f/u ov/[name] re: copd/ 02 dep  with sleep/ ex / on symbicort and spiriva  Chief Complaint  Patient presents with  . Follow-up    ROV : Pt c/o incr SOB and states that he has had a recent decrease in activity--does not feel like his stamina/endurance is where it needs to be.   Variable doe  seemed some better on prednisone - never sob at rest or sleeping  Wears 02 when sleeps at 2 and exercises at 2lpm on treadmill at 1.2 mph level, goes 5 min rest 1 min Has not tried pre treating with xopenex before he does the treadmill, never tried slow setting, never uses neb xopenex rec Try using your xopenex hfa  before planned walk on your treadmill to see what difference if any this makes Prevnar 13 given today Resume prednisone 10 mg x 2 daily when xopenx is not lasting 4 hours, then 1 daily x 5 days and stop      10/23/2013 f/u ov/[name] re: copd/02 dep on symb/spiriva/ xopenex prn plus back up pred short course Chief Complaint  Patient presents with  . Follow-up    SOB   feels prednisone helped a lot with sob and less need for saba Able do treadmill x 15 min x 1 mph no grade no  wheeze overt sinus or hb symptoms.   rec Try using your xopenex hfa  before planned walk on your treadmill to see what difference if any this makes prevnar 13 given today Resume prednisone 10 mg x 2 daily when xopenx is not lasting 4 hours, then 1 daily x 5 days and stop   02/26/2014 f/u ov/[name] re: GOLD III/ confused with action plan portion of med calendar  Chief Complaint  Patient presents with  . Follow-up    Pt c/o increased cough and SOB for the past 3 wks. His cough is worse in the am and is prod with white to light green sputum. He is using xopenex inhaler about 3-4 x per day and has not used neb.    treadmill x 15 min s stopping  x 1.2 mph flat/ on 02 2lpm with sats no lower than 93  Cough is day > noct, not flaring in am, no purulent sputum rec Goal is to keep sats over 90% with activity     06/01/2014 f/u ov/[name] re: COPD GOLD III/  symb/ spiriva respimat prednsione ? Dose / did not bring med calendar Chief Complaint  Patient presents  with  . Follow-up    Pt states his breathing is progressively worse since the last visit.    increased to 32 m on 02  1.2 mph, no tilt s stopping p 10 min to rest on 2lpm with adequate sats reported    some days  Prednisone 10 mg tapered but confused with instructions Drippy nose no better with flonase / clariton > has not tried 1st gen H1  Not much change using saba before ex  >>change claritin to chlortrimeton   06/15/2014 Follow up and Med review : COPD GOLD III/  Pt returns for follow up and med review  We reviewed all his meds and organized them into a med calendar with pt education  Appears to be taking meds correctly.  Last visit with copd flare , changed claritin to chlortabs but unable to tolerate during daytime Due to sleepiness.  Also to take 1/2 toprol for weakness, says he never had any episodes so went back on 1 whole tablet of toprol.  rec Follow med calendar closely and bring to each visit.  Please contact office for sooner follow up if symptoms do not improve or worsen or seek emergency care   Follow up Dr. [name]  In 3 months and As needed.    09/21/2014 f/u ov/[place] Kestner re: GOLD III  Chief Complaint  Patient presents with  . Follow-up    Pt c/o increased SOB with activity, occ chest tightness, prod cough early mornings with white mucus. Constant sinus drainage and nose bleed on 09/19/14.    Doe better until 3 weeks prior to OV  Then worse sob assoc with nasal congestion off nasonex  - note also stopped omeprazole "I don't have reflux" - see Dg  Es below rec Restart dymista one twice daily  Continue prednisone 20 mg daily until better  then taper  See calendar for specific medication instructions   10/19/2014  f/u ov/[place] Derks re: GOLD III / maint rx symbicort/ spiriva Chief Complaint  Patient presents with  . Follow-up    Pt c/o fatigue and increased chest congestion for  the past several wks. He has am cough with white sputum. He is using xopenex 2 x per day on average.    on 10/8 started pred 20 and zpak as per med calendar action plan  gxt flat x 49mph x 15 min and zero grade on 2lpm with sats mid 90s at home  >decrease pred 5mg    11/19/2014 NP Follow up : COPD GOLD III  Returns for follow up .  Says he had a flare 4 days ago with increased SOB, occasional dry cough, chest tightness. Sinus pressure and drainage. Denies fever, wheezing, nausea or vomiting. Started on Zpack and prednisone 20mg  yesterday .  Feels better   rec Follow med calendar closely and bring to each visit.  Finish Zpack as directed.  Taper prednisone and hold at 5mg  daily  Please contact office for sooner follow up if symptoms do not improve or worsen or seek emergency care     02/19/2015  f/u ov/[name] re: copd GOLD III symb/ spiriva  And 02 2lpm 24/7  Chief Complaint  Patient presents with  . Follow-up    Pt c/o increasing SOB even without exertion and some occasional cough. Pt is still on O2/2L. Pt also c/o fatigue, increased sleep, loss of appetite, irregular pulse. Pt states that when he wakes up he "doesn't feel like he will make it through the day".    on gxt x  2lpm continuous x 68mph on gxt x 15 m at flat grade s stopping  rec For significant swelling take furosemide 20 mg one daily as need as per your med calendar schedule    04/15/2015  f/u ov/[name] re: GOLD III/ hypercarbic/ maint rx symb and spiriva and pred floor of 10 mg and 02 lpm 24/7  Chief Complaint  Patient presents with  . Follow-up    Pt c/o increased nasal congestion and SOB x 2 wks. He is coughing more and producing minmal clear to white sputum.  He is using xopenex inhaler 2-3 x per day.        Wakes up feeling really lousy, mild ha and sluggish despite neg osa eval in past Not using flutter and quite congested in am as well  Doe = MMRC3 = can't walk 100 yards even at a slow pace at a flat grade s stopping due  to sob  Even on 02/ not using treadmill any more  >>cont current regimen   05/20/15 NPFollow up : GOLD III COPD /Hypercarbic RF on O2, chronic steroids at pred 10mg   Patient returns for a one-month follow-up. He is accompanied by his wife. Last visit. Patient continued to have progressive symptoms of shortness of breath, weakness and fatigue. Lab work showed a normal TSH, chronic hypercarbia with bicarbonate on the left) with 35. Normal BNP. And CBC . We reviewed all his medications and organized them into a medication calendar with patient education  appears to be taking his medications correctly. Complains of nasal stuffiness and postnasal drip.   rec Always use nasonex at least one puff at bedtime  use the flutter valve as much as possible  GERD diet  05/21/15 Eval by Dr Dedio/ rec trial of NIV     06/30/2015  f/u ov/[name] re: GOLD IV copd/ 02 dep hypercarbic/hypoxemic on symb 160/spiriva Chief Complaint  Patient presents with  . Acute Visit    Pt c/o increased DOE for the past month. He is also c/o unintended wt loss over the past 3-4 months.    most of his problem is fatigue, not limiting sob, and has yet to be able to use NIV for more than a few min to maybe an hour/ very inactive  No obvious day to day or daytime variability or assoc excess/ purulent sputum or mucus plugs   or cp or chest tightness, subjective wheeze or overt sinus or hb symptoms. No unusual exp hx or h/o childhood pna/ asthma or knowledge of premature birth.  Sleeping ok without nocturnal  or early am exacerbation  of respiratory  c/o's or need for noct saba. Also denies any obvious fluctuation of symptoms with weather or environmental changes or other aggravating or alleviating factors except as outlined above   Current Medications, Allergies, Complete Past Medical History, Past Surgical History, Family History, and Social History were reviewed in [employer] record.  ROS  The following are  not active complaints unless bolded sore throat, dysphagia, dental problems, itching, sneezing,  nasal congestion or excess/ purulent secretions, ear ache,   fever, chills, sweats, unintended wt loss, classically pleuritic or exertional cp, hemoptysis,  orthopnea pnd or leg swelling, presyncope, palpitations, abdominal pain, anorexia, nausea, vomiting, diarrhea  or change in bowel or bladder habits, change in stools or urine, dysuria,hematuria,  rash, arthralgias, visual complaints, headache, numbness, weakness or ataxia or problems with walking or coordination,  change in mood/affect or memory.  Past Medical History:  CHRONIC RHINITIS (ICD-472.0)  - Sinus ct March 03, 2010 > thickening only, no acute changes  CHRONIC RESPIRATORY FAILURE (ICD-518.83)  - 02 rx since 2008  COPD UNSPECIFIED (ICD-496)....................................................................[name]  - PFTs 05/04/05 FEV1 36% ratio 28% with 29% improvement after bronchodilators DLCO 48%  - PFT's 03/26/08 30% ratio 34 with 14% improvement after bronchodilators DLC0 38 %  - Start noct 02 at 2lpm 2008 and on 03/04/09 desat @ > 185 ft so rec wear with activtiy > rm to rm  - [facility] June 30, 2008 100% > confirmed March 04, 2009  ISCHEMIC HEART DISEASE (ICD-414.9)  HYPERTENSION (ICD-401.9)  HYPERLIPIDEMIA (ICD-272.4)  SPN RUL by cxr August 19, 2009  COMPLEX MED REGIMEN-Meds reviewed with pt education and computerized med calendar 11/22/09 03/10/2011  , 10/15/2012  07/17/2013 , 12/09/2013 , 06/15/2014 , 11/19/14 , 5.11.17          Objective:   Physical Exam  amb wm nad  /vital signs reviewed    wt 159 04/28/2010  > 10/14/2010 154  > 10/04/2011 150> 12/21/2011  150 > 149 01/29/2012 >154 04/05/2012 > 05/22/2012 148 > 09/30/2012  146 >>144> 10/15/2012 > 12/18/2012  145 > 148 02/14/2013 >148 03/14/2013 > 04/25/2013 148 > 06/19/2013 150 >>07/17/2013 > 08/27/2013 148 > 10/23/2013  148 >153 12/09/2013 > 02/26/2014  153 > 06/01/2014  152  > 157 06/15/2014 > 09/21/2014 146 > 10/19/2014 151 >>148 11/19/14 > 02/24/2015 148 > 06/30/2015    140   HEENT  mod nonspecific turbinate edema.  Oropharynx no thrush or excess pnd or cobblestoning.  No JVD or cervical adenopathy. Mild accessory muscle hypertrophy. Trachea midline, nl thryroid. Chest was hyperinflated by percussion with diminished breath sounds and moderate increased exp time without wheeze.   Regular rate and rhythm without murmur gallop or rub or increase P2 -  trace pitting bilateral lower ext edema R > L .   Abd: no hsm, nl excursion. Ext warm without cyanosis or clubbing.       DgEs  04/28/14 Mild gastroesophageal reflux. No hiatal hernia.   Labs ordered/ reviewed:    Chemistry      Component Value Date/Time   NA 140 04/15/2015 1118   K 4.3 04/15/2015 1118   CL 101 04/15/2015 1118   CO2 35* 04/15/2015 1118   BUN 18 04/15/2015 1118   CREATININE 1.11 04/15/2015 1118      Component Value Date/Time   CALCIUM 10.3 04/15/2015 1118   ALKPHOS 79 06/06/2013 1217   AST 23 06/06/2013 1217   ALT 15 06/06/2013 1217   BILITOT 0.6 06/06/2013 1217        Lab Results  Component Value Date   WBC 8.8 04/15/2015   HGB 13.2 04/15/2015   HCT 39.1 04/15/2015   MCV 94.8 04/15/2015   PLT 224.0 04/15/2015         Lab Results  Component Value Date   TSH 3.44 04/15/2015     Lab Results  Component Value Date   PROBNP 71.0 04/15/2015     ABGs 06/08/15  2lpm >  7.41/49/85

## 2015-06-30 NOTE — Patient Instructions (Addendum)
Goal with the 02 is keep sats over 90%   When breathing worse go ahead and double prednisone dose as per the med calendar  See Dr DeDios w/in 6 weeks instead of me (cancel my visit and see him instead)

## 2015-07-01 ENCOUNTER — Encounter: Payer: Self-pay | Admitting: Internal Medicine

## 2015-07-01 NOTE — Assessment & Plan Note (Signed)
-   Start noct 02 at 2lpm 2008 and on 03/04/09 desat @ > 185 ft so rec wear with activtiy > rm to rm  - 02/15/2011   Walked RA x one lap @ 185 stopped due to  desat > corrected on 2lpm - 02/26/2014  Walked 2lpm  2 laps @ 185 ft each stopped due to  Sob/ sats 88% at nl pace  - 10/19/2014   Walked RA  2 laps @ 185 ft each stopped due to  End of study, slow pace,  sob  But no  desat   - HCO3  04/15/2015   = 35  rx as of 04/15/2015  = 2lpm 24/7  / NIV rec 06/2015 by Dr Corrie Dandy

## 2015-07-01 NOTE — Assessment & Plan Note (Signed)
-   PFTs 05/04/05 FEV1 36% ratio 28% with 29% improvement after bronchodilators DLCO 48%  - PFT's 03/26/08 30% ratio 34 with 14% improvement after bronchodilators DLC0 38 %  - Referred to rehab 12/19/2012 > completed March 2015  - changed to spiriva respimat 02/14/2013  - started daily prednisone 06/19/13 > improved only a little> tapered off mid July 2015 > ok to change to prn prednisone 08/27/2013  - flared off nasonex and gerd rx 09/21/2014 > resumed - 10/19/2014  p extensive coaching HFA effectiveness =    90%  - changed pred to ceiling of 20 / floor of 5 mg daily as of 10/19/2014  - PFT's  06/08/2015  FEV1 0.58 (22 % ) ratio 27  p No sign % improvement from saba p symb/spiriva prior to study with DLCO  9/9 % corrects to 27 % for alv volume     I had an extended discussion with the patient reviewing all relevant studies completed to date and  lasting 15 to 20 minutes of a 25 minute visit on the following ongoing concerns:   1) approaching endstage dz and if NIV doesn't help he needs to consider a palliative care/ hospice approach  2) in this setting he is really not a candidate for any form of surgery unless performed on a compassionate basis to relieve symptoms that can't be otherwise controlled  3) referred back to Dr DeDios so sort out NIV issues as not tol well at all    I had an extended discussion with the patient reviewing all relevant studies completed to date and  lasting 15 to 20 minutes of a 25 minute visit    Each maintenance medication was reviewed in detail including most importantly the difference between maintenance and prns and under what circumstances the prns are to be triggered using an action plan format that is not reflected in the computer generated alphabetically organized AVS but trather by a customized med calendar that reflects the AVS meds with confirmed 100% correlation.   Please see instructions for details which were reviewed in writing and the patient given a copy  highlighting the part that I personally wrote and discussed at today's ov.

## 2015-07-02 ENCOUNTER — Telehealth: Payer: Self-pay | Admitting: Pulmonary Disease

## 2015-07-02 DIAGNOSIS — J9611 Chronic respiratory failure with hypoxia: Secondary | ICD-10-CM

## 2015-07-02 NOTE — Telephone Encounter (Signed)
Pt needs an oxygen concentrator for travel purposes - pt states that he only needs a Rx stating that he needs gas/liquid O2 concentrator at 2 Liters/min to use at night and for exertion. Pt states that he will be taking the Rx to [facility] in [place] where he is traveling to. Pt states that he needs this Rx by Tuesday at the latest. Please advise Dr [name]. Thanks.

## 2015-07-03 NOTE — Telephone Encounter (Signed)
Fine with me unless traveling by air

## 2015-07-05 NOTE — Telephone Encounter (Signed)
Patients wife came by for prescription for oxygen.  Rx printed, signed and given to wife. Nothing further needed.

## 2015-07-05 NOTE — Telephone Encounter (Signed)
Called and spoke with Pam at [facility].  Advised her that Dr. Corrie Dandy will not be back until 7/6.  She said that it would be fine to wait until Dr. Corrie Dandy returns. Will send to [facility] for follow up.

## 2015-07-05 NOTE — Telephone Encounter (Signed)
Attempted to contact pt. No answer, no option to leave a message. Will try back.  

## 2015-07-05 NOTE — Telephone Encounter (Signed)
Patient wife in LOBBY to speak to nurse

## 2015-07-09 ENCOUNTER — Ambulatory Visit: Payer: Medicare Other | Admitting: Podiatry

## 2015-07-15 NOTE — Telephone Encounter (Signed)
I'm ok with this as long as Tennis Must DeDios is as will need likely  NIV periop

## 2015-07-15 NOTE — Telephone Encounter (Signed)
We have only seen this pt for sleep. He sees MW for COPD. Will give to MW to complete.   MW Juluis Rainier

## 2015-07-15 NOTE — Telephone Encounter (Signed)
[name] can we make sure that AD addresses this today? Thanks.

## 2015-07-19 NOTE — Telephone Encounter (Signed)
lmtcb x 1  Okay to schedule for 07/10/15 at 10:30 am.

## 2015-07-19 NOTE — Telephone Encounter (Signed)
  Can I see the pt either today, tues, or wed as an add on ? Thanks.  If pt cant make it, pls let me know.  AD

## 2015-07-19 NOTE — Telephone Encounter (Signed)
AD please advise. Thanks. 

## 2015-07-19 NOTE — Telephone Encounter (Signed)
Pt has already came in on 06/30/15

## 2015-07-20 ENCOUNTER — Telehealth: Payer: Self-pay | Admitting: Internal Medicine

## 2015-07-20 NOTE — Telephone Encounter (Signed)
LM for Pam at [facility] x 1

## 2015-07-20 NOTE — Telephone Encounter (Signed)
Spoke with Pam at [facility], clarified questions about Dr. [name] and Dr. Corrie Dandy that she had about patient's care.  Nothing further needed.

## 2015-07-28 ENCOUNTER — Other Ambulatory Visit: Payer: Self-pay | Admitting: Internal Medicine

## 2015-07-31 DIAGNOSIS — N200 Calculus of kidney: Secondary | ICD-10-CM | POA: Diagnosis not present

## 2015-07-31 DIAGNOSIS — I700 Atherosclerosis of aorta: Secondary | ICD-10-CM | POA: Diagnosis not present

## 2015-07-31 DIAGNOSIS — R609 Edema, unspecified: Secondary | ICD-10-CM | POA: Diagnosis not present

## 2015-07-31 DIAGNOSIS — J9811 Atelectasis: Secondary | ICD-10-CM | POA: Diagnosis not present

## 2015-07-31 DIAGNOSIS — J984 Other disorders of lung: Secondary | ICD-10-CM | POA: Diagnosis not present

## 2015-07-31 DIAGNOSIS — J449 Chronic obstructive pulmonary disease, unspecified: Secondary | ICD-10-CM | POA: Diagnosis not present

## 2015-07-31 DIAGNOSIS — R918 Other nonspecific abnormal finding of lung field: Secondary | ICD-10-CM | POA: Diagnosis not present

## 2015-07-31 DIAGNOSIS — J439 Emphysema, unspecified: Secondary | ICD-10-CM | POA: Diagnosis not present

## 2015-08-03 ENCOUNTER — Other Ambulatory Visit: Payer: Self-pay | Admitting: Urology

## 2015-08-03 DIAGNOSIS — R0981 Nasal congestion: Secondary | ICD-10-CM | POA: Diagnosis not present

## 2015-08-03 DIAGNOSIS — F419 Anxiety disorder, unspecified: Secondary | ICD-10-CM | POA: Diagnosis not present

## 2015-08-03 DIAGNOSIS — I10 Essential (primary) hypertension: Secondary | ICD-10-CM | POA: Diagnosis not present

## 2015-08-03 DIAGNOSIS — J449 Chronic obstructive pulmonary disease, unspecified: Secondary | ICD-10-CM | POA: Diagnosis not present

## 2015-08-03 DIAGNOSIS — F329 Major depressive disorder, single episode, unspecified: Secondary | ICD-10-CM | POA: Diagnosis not present

## 2015-08-03 DIAGNOSIS — N401 Enlarged prostate with lower urinary tract symptoms: Secondary | ICD-10-CM | POA: Diagnosis not present

## 2015-08-03 DIAGNOSIS — R3129 Other microscopic hematuria: Secondary | ICD-10-CM | POA: Diagnosis not present

## 2015-08-05 NOTE — Addendum Note (Signed)
Addended by: Reola Calkins on: 08/05/2015 03:27 PM   Modules accepted: Orders

## 2015-08-06 DIAGNOSIS — J9612 Chronic respiratory failure with hypercapnia: Secondary | ICD-10-CM | POA: Diagnosis not present

## 2015-08-06 DIAGNOSIS — J449 Chronic obstructive pulmonary disease, unspecified: Secondary | ICD-10-CM | POA: Diagnosis not present

## 2015-08-07 ENCOUNTER — Other Ambulatory Visit: Payer: Self-pay | Admitting: Internal Medicine

## 2015-08-09 ENCOUNTER — Encounter: Payer: Self-pay | Admitting: Internal Medicine

## 2015-08-09 DIAGNOSIS — D4959 Neoplasm of unspecified behavior of other genitourinary organ: Secondary | ICD-10-CM | POA: Diagnosis not present

## 2015-08-10 DIAGNOSIS — J449 Chronic obstructive pulmonary disease, unspecified: Secondary | ICD-10-CM | POA: Diagnosis not present

## 2015-08-10 DIAGNOSIS — D4959 Neoplasm of unspecified behavior of other genitourinary organ: Secondary | ICD-10-CM

## 2015-08-10 DIAGNOSIS — R319 Hematuria, unspecified: Secondary | ICD-10-CM | POA: Diagnosis not present

## 2015-08-10 DIAGNOSIS — F419 Anxiety disorder, unspecified: Secondary | ICD-10-CM | POA: Diagnosis not present

## 2015-08-10 DIAGNOSIS — I10 Essential (primary) hypertension: Secondary | ICD-10-CM | POA: Diagnosis not present

## 2015-08-10 DIAGNOSIS — H539 Unspecified visual disturbance: Secondary | ICD-10-CM | POA: Diagnosis not present

## 2015-08-10 HISTORY — DX: Neoplasm of unspecified behavior of other genitourinary organ: D49.59

## 2015-08-12 ENCOUNTER — Telehealth: Payer: Self-pay | Admitting: Interventional Cardiology

## 2015-08-12 DIAGNOSIS — H6123 Impacted cerumen, bilateral: Secondary | ICD-10-CM | POA: Diagnosis not present

## 2015-08-12 DIAGNOSIS — K228 Other specified diseases of esophagus: Secondary | ICD-10-CM | POA: Diagnosis not present

## 2015-08-12 DIAGNOSIS — R51 Headache: Secondary | ICD-10-CM | POA: Diagnosis not present

## 2015-08-12 NOTE — Telephone Encounter (Signed)
New message     Pt would like to know if he was dx w/aortic stenosis? Pt would also like to know if he can have general anethesia for an upcoming procedure? Please call.

## 2015-08-12 NOTE — Telephone Encounter (Signed)
F/u Message  Pt wife returning Rn call. Please call back to discuss

## 2015-08-12 NOTE — Telephone Encounter (Signed)
Left message for patient to call back  

## 2015-08-12 NOTE — Telephone Encounter (Signed)
Left message for patient that clearance was already sent to [facility]. Instructed him to call back if he has further questions or concerns.

## 2015-08-13 NOTE — Telephone Encounter (Signed)
Called patient back. Patient wanted to know what clearance note stated. Informed patient that it looks like he needs appointment with Dr. Tamala Julian. Patient thanked me for my time. Will forward to Dr. Thompson Caul nurse so she is aware.

## 2015-08-13 NOTE — Telephone Encounter (Signed)
Follow Up: ° ° ° °Returning your call from yesterday. °

## 2015-08-15 DIAGNOSIS — J320 Chronic maxillary sinusitis: Secondary | ICD-10-CM | POA: Diagnosis not present

## 2015-08-15 DIAGNOSIS — J342 Deviated nasal septum: Secondary | ICD-10-CM | POA: Diagnosis not present

## 2015-08-16 DIAGNOSIS — N135 Crossing vessel and stricture of ureter without hydronephrosis: Secondary | ICD-10-CM | POA: Diagnosis not present

## 2015-08-16 DIAGNOSIS — Z7982 Long term (current) use of aspirin: Secondary | ICD-10-CM | POA: Diagnosis not present

## 2015-08-16 DIAGNOSIS — Z951 Presence of aortocoronary bypass graft: Secondary | ICD-10-CM | POA: Diagnosis not present

## 2015-08-16 DIAGNOSIS — Z882 Allergy status to sulfonamides status: Secondary | ICD-10-CM | POA: Diagnosis not present

## 2015-08-16 DIAGNOSIS — N400 Enlarged prostate without lower urinary tract symptoms: Secondary | ICD-10-CM | POA: Diagnosis not present

## 2015-08-16 DIAGNOSIS — J439 Emphysema, unspecified: Secondary | ICD-10-CM | POA: Diagnosis not present

## 2015-08-16 DIAGNOSIS — R9341 Abnormal radiologic findings on diagnostic imaging of renal pelvis, ureter, or bladder: Secondary | ICD-10-CM | POA: Diagnosis not present

## 2015-08-16 DIAGNOSIS — I10 Essential (primary) hypertension: Secondary | ICD-10-CM | POA: Diagnosis not present

## 2015-08-16 DIAGNOSIS — D4959 Neoplasm of unspecified behavior of other genitourinary organ: Secondary | ICD-10-CM | POA: Diagnosis not present

## 2015-08-16 DIAGNOSIS — Z87891 Personal history of nicotine dependence: Secondary | ICD-10-CM | POA: Diagnosis not present

## 2015-08-16 DIAGNOSIS — N3289 Other specified disorders of bladder: Secondary | ICD-10-CM | POA: Diagnosis not present

## 2015-08-16 DIAGNOSIS — E7800 Pure hypercholesterolemia, unspecified: Secondary | ICD-10-CM | POA: Diagnosis not present

## 2015-08-16 DIAGNOSIS — Z883 Allergy status to other anti-infective agents status: Secondary | ICD-10-CM | POA: Diagnosis not present

## 2015-08-16 DIAGNOSIS — Z9981 Dependence on supplemental oxygen: Secondary | ICD-10-CM | POA: Diagnosis not present

## 2015-08-16 DIAGNOSIS — Z79899 Other long term (current) drug therapy: Secondary | ICD-10-CM | POA: Diagnosis not present

## 2015-08-17 DIAGNOSIS — N3289 Other specified disorders of bladder: Secondary | ICD-10-CM | POA: Diagnosis not present

## 2015-08-17 DIAGNOSIS — R9341 Abnormal radiologic findings on diagnostic imaging of renal pelvis, ureter, or bladder: Secondary | ICD-10-CM | POA: Diagnosis not present

## 2015-08-17 DIAGNOSIS — N400 Enlarged prostate without lower urinary tract symptoms: Secondary | ICD-10-CM | POA: Diagnosis not present

## 2015-08-17 DIAGNOSIS — I10 Essential (primary) hypertension: Secondary | ICD-10-CM | POA: Diagnosis not present

## 2015-08-17 DIAGNOSIS — J439 Emphysema, unspecified: Secondary | ICD-10-CM | POA: Diagnosis not present

## 2015-08-17 DIAGNOSIS — E7800 Pure hypercholesterolemia, unspecified: Secondary | ICD-10-CM | POA: Diagnosis not present

## 2015-08-18 NOTE — Telephone Encounter (Signed)
Lmom. Clearance request form scanned into patients chart is for pulmonology.  We have not received a cardiac clearance request. It looks like the pts sx is already scheduled for 8/18. He should contact the surgeons office asap to verify if the are needing cardiac clearance, if so the request should be fwd to our office asap.  Pt is to call back if any questions or if assistance is needed.

## 2015-08-19 ENCOUNTER — Other Ambulatory Visit: Payer: Self-pay | Admitting: Urology

## 2015-08-19 DIAGNOSIS — D4122 Neoplasm of uncertain behavior of left ureter: Secondary | ICD-10-CM

## 2015-08-20 ENCOUNTER — Ambulatory Visit: Payer: Medicare Other | Admitting: Internal Medicine

## 2015-08-23 NOTE — Progress Notes (Signed)
Ekg, lov dr h smith 3/17 epic, lov dr wert 6/17 EPIC

## 2015-08-23 NOTE — Patient Instructions (Signed)
[name]  08/23/2015   Your procedure is scheduled on: 09/10/15  Report to [facility] Main  Entrance take [facility]  elevators to 3rd floor to  [place] at 0900  AM.  Call this number if you have problems the morning of surgery [phone]   Remember: ONLY 1 PERSON MAY GO WITH YOU TO SHORT STAY TO GET  READY MORNING OF [place].  Do not eat food or drink liquids :After Midnight.     Take these medicines the morning of surgery with A SIP OF WATER: TOPROL, Finasteride, Omeprazole, Prednisone, Use SPIRIVIA, Nasonex May take Alproaolam, Claritin, Nitroglycerin, Use Xopenex if needed DO NOT TAKE ANY DIABETIC MEDICATIONS DAY OF [place] may not have any metal on your body including hair pins and              piercings  Do not wear jewelry, make-up, lotions, powders or perfumes, deodorant             Do not wear nail polish.  Do not shave  48 hours prior to surgery.              Men may shave face and neck.   Do not bring valuables to the hospital. [place].  Contacts, dentures or bridgework may not be worn into surgery.  Leave suitcase in the car. After surgery it may be brought to your room.     Patients discharged the day of surgery will not be allowed to drive home.  Name and phone number of your driver:  Special Instructions: N/A              Please read over the following fact sheets you were given: _____________________________________________________________________             [facility] - Preparing for Surgery Before surgery, you can play an important role.  Because skin is not sterile, your skin needs to be as free of germs as possible.  You can reduce the number of germs on your skin by washing with CHG (chlorahexidine gluconate) soap before surgery.  CHG is an antiseptic cleaner which kills germs and bonds with the skin to continue killing germs even  after washing. Please DO NOT use if you have an allergy to CHG or antibacterial soaps.  If your skin becomes reddened/irritated stop using the CHG and inform your nurse when you arrive at Short Stay. Do not shave (including legs and underarms) for at least 48 hours prior to the first CHG shower.  You may shave your face/neck. Please follow these instructions carefully:  1.  Shower with CHG Soap the night before surgery and the  morning of Surgery.  2.  If you choose to wash your hair, wash your hair first as usual with your  normal  shampoo.  3.  After you shampoo, rinse your hair and body thoroughly to remove the  shampoo.                           4.  Use CHG as you would any other  liquid soap.  You can apply chg directly  to the skin and wash                       Gently with a scrungie or clean washcloth.  5.  Apply the CHG Soap to your body ONLY FROM THE NECK DOWN.   Do not use on face/ open                           Wound or open sores. Avoid contact with eyes, ears mouth and genitals (private parts).                       Wash face,  Genitals (private parts) with your normal soap.             6.  Wash thoroughly, paying special attention to the area where your surgery  will be performed.  7.  Thoroughly rinse your body with warm water from the neck down.  8.  DO NOT shower/wash with your normal soap after using and rinsing off  the CHG Soap.                9.  Pat yourself dry with a clean towel.            10.  Wear clean pajamas.            11.  Place clean sheets on your bed the night of your first shower and do not  sleep with pets. Day of Surgery : Do not apply any lotions/deodorants the morning of surgery.  Please wear clean clothes to the hospital/surgery center.  FAILURE TO FOLLOW THESE INSTRUCTIONS MAY RESULT IN THE CANCELLATION OF YOUR SURGERY PATIENT SIGNATURE_________________________________  NURSE  SIGNATURE__________________________________  ________________________________________________________________________

## 2015-08-24 ENCOUNTER — Encounter (HOSPITAL_COMMUNITY)
Admission: RE | Admit: 2015-08-24 | Discharge: 2015-08-24 | Disposition: A | Discharge status: Home / Self Care | Payer: Medicare Other | Source: Ambulatory Visit | Attending: Urology | Admitting: Urology

## 2015-08-26 DIAGNOSIS — N400 Enlarged prostate without lower urinary tract symptoms: Secondary | ICD-10-CM | POA: Diagnosis not present

## 2015-08-26 DIAGNOSIS — N289 Disorder of kidney and ureter, unspecified: Secondary | ICD-10-CM | POA: Diagnosis not present

## 2015-08-30 ENCOUNTER — Telehealth (HOSPITAL_COMMUNITY): Payer: Self-pay | Admitting: Radiology

## 2015-08-30 NOTE — Telephone Encounter (Signed)
Received phone call from [facility] to cancel appointment for nephrostomy placement on 09/10/15.  Patient is not feeling well.

## 2015-09-06 DIAGNOSIS — R0600 Dyspnea, unspecified: Secondary | ICD-10-CM | POA: Diagnosis not present

## 2015-09-06 DIAGNOSIS — I119 Hypertensive heart disease without heart failure: Secondary | ICD-10-CM | POA: Diagnosis not present

## 2015-09-06 DIAGNOSIS — I739 Peripheral vascular disease, unspecified: Secondary | ICD-10-CM | POA: Diagnosis not present

## 2015-09-06 DIAGNOSIS — I2510 Atherosclerotic heart disease of native coronary artery without angina pectoris: Secondary | ICD-10-CM | POA: Diagnosis not present

## 2015-09-06 DIAGNOSIS — J449 Chronic obstructive pulmonary disease, unspecified: Secondary | ICD-10-CM | POA: Diagnosis not present

## 2015-09-06 DIAGNOSIS — I4949 Other premature depolarization: Secondary | ICD-10-CM | POA: Diagnosis not present

## 2015-09-06 DIAGNOSIS — R002 Palpitations: Secondary | ICD-10-CM | POA: Diagnosis not present

## 2015-09-06 DIAGNOSIS — I872 Venous insufficiency (chronic) (peripheral): Secondary | ICD-10-CM | POA: Diagnosis not present

## 2015-09-06 DIAGNOSIS — I6523 Occlusion and stenosis of bilateral carotid arteries: Secondary | ICD-10-CM | POA: Diagnosis not present

## 2015-09-06 DIAGNOSIS — R0609 Other forms of dyspnea: Secondary | ICD-10-CM | POA: Diagnosis not present

## 2015-09-06 DIAGNOSIS — R0602 Shortness of breath: Secondary | ICD-10-CM | POA: Diagnosis not present

## 2015-09-06 DIAGNOSIS — I2781 Cor pulmonale (chronic): Secondary | ICD-10-CM | POA: Diagnosis not present

## 2015-09-06 DIAGNOSIS — E785 Hyperlipidemia, unspecified: Secondary | ICD-10-CM | POA: Diagnosis not present

## 2015-09-07 ENCOUNTER — Other Ambulatory Visit: Payer: Self-pay | Admitting: Family Medicine

## 2015-09-07 DIAGNOSIS — Z23 Encounter for immunization: Secondary | ICD-10-CM | POA: Diagnosis not present

## 2015-09-07 DIAGNOSIS — F324 Major depressive disorder, single episode, in partial remission: Secondary | ICD-10-CM | POA: Diagnosis not present

## 2015-09-07 DIAGNOSIS — R109 Unspecified abdominal pain: Secondary | ICD-10-CM | POA: Diagnosis not present

## 2015-09-07 DIAGNOSIS — R634 Abnormal weight loss: Secondary | ICD-10-CM | POA: Diagnosis not present

## 2015-09-08 ENCOUNTER — Other Ambulatory Visit (HOSPITAL_COMMUNITY): Payer: Medicare Other

## 2015-09-09 ENCOUNTER — Encounter: Payer: Self-pay | Admitting: Pulmonary Disease

## 2015-09-09 ENCOUNTER — Ambulatory Visit (INDEPENDENT_AMBULATORY_CARE_PROVIDER_SITE_OTHER): Payer: Medicare Other | Admitting: Pulmonary Disease

## 2015-09-09 DIAGNOSIS — J449 Chronic obstructive pulmonary disease, unspecified: Secondary | ICD-10-CM | POA: Diagnosis not present

## 2015-09-09 DIAGNOSIS — J329 Chronic sinusitis, unspecified: Secondary | ICD-10-CM | POA: Insufficient documentation

## 2015-09-09 DIAGNOSIS — J9612 Chronic respiratory failure with hypercapnia: Secondary | ICD-10-CM

## 2015-09-09 DIAGNOSIS — J9611 Chronic respiratory failure with hypoxia: Secondary | ICD-10-CM | POA: Diagnosis not present

## 2015-09-09 DIAGNOSIS — J328 Other chronic sinusitis: Secondary | ICD-10-CM

## 2015-09-09 DIAGNOSIS — I779 Disorder of arteries and arterioles, unspecified: Secondary | ICD-10-CM

## 2015-09-09 NOTE — Assessment & Plan Note (Signed)
Start flonase. D/c nasonex.

## 2015-09-09 NOTE — Assessment & Plan Note (Signed)
Patient on oxygen 2 L 24 7. Prior to 8  months ago, he was just on 2 L when necessary. ABG done in 2012 showed CO2 of 48, unknown FiO2. ABG on 2L  (05/2015)  7.4/49/89 on 2L.  We checked his oxygen level on room air at sitting: 94%.  Suggested to try to keep his O2 saturation 88-90 percent at least. When sitting, he can turn off the oxygen as long as sats are acceptable. On walking and doing the treadmill, he can use oxygen 1 or 2 L to keep his saturation more than 88%.

## 2015-09-09 NOTE — Assessment & Plan Note (Addendum)
Patient has severe/very severe emphysema. Last PFT with an FEV1 of 36%. Diffusion capacity of 48%. (05/04/2005) Patient currently is now on oxygen 2 L 24 7. The last 8 months, patient's COPD has been very difficult to control with frequent exacerbation requiring frequent office visits, prednisone, antibiotics. He is also now on oxygen 2 L 24/7. Prior to this, he was just on oxygen 2 L with exertion.  We started NIV Astral on this patient back in June. Since starting it, he has had issues tolerating it because the pressures too much. He usually wakes up early morning with too much pressure, unable to breathe, suffocating, uncomfortable.  Plan : 1. [facility] will visit him in am. I will call the DME and mention regarding maybe trying Trilogy instead of astral and try a different mask. We also need to decrease the pressure to make it more tolerable for the patient. 2. Cont NIV for now with 2L o2. 3. If he does not tolerate NIV, plan to d/c NIV alltogether.  4. Currently he is on Symbicort, 160/4.5, 2 puffs twice a day and Spiriva respimat. I suggested maybe try Pulmicort nebulizer 0.5 twice a day and DuoNeb 4 times a day and see if the breathing gets better with that. I will need to touch base with Dr. [name] regarding this. 5. They also asked about daliresp. I don't think there is any indication for that at this point. Will reconsider in the future. 6. UTD with vaccines. Got flu vaccine.

## 2015-09-09 NOTE — Progress Notes (Signed)
Subjective:    Patient ID: [name], male    DOB: 07/13/1929, 82 y.o.   MRN: [id number]  HPI  Pt is being seen regularly by MW for very severe/severe COPD, chronic hypoxemic resp failure.  Recently seen by MW abd was treated for aecopd. Was on higher pred but tapered down to usual dose.   Pt has had 2 sleep studies. Last study was 03/2005 and AHI was <1.   The last 6 mos, pt's COPD has been difficult to control. Pt with more frequent flare ups requiring higher prednisone +/- Abx. Pt has also been seeing/calling Dr. [name].   Also on 2L 24/7 the last 6 mos.   Gets 8-9hrs/night. No  No witnessed apneas. , gasping, choking.  Wakes up very tired.  Naps every now and then.  Occasional jerking of legs at sleep.   Follows with Cardiology. Sees Dr. Tamala Julian for CAD. CAD has been stable.    ROV 09/09/15 Patient returns to the office as follow-up on his severe COPD, chronic hypoxemic respiratory failure, NIV use. He usually follows with Dr. [name]. Last time he saw her was back in June. Since I last saw him, he has been busy with another diagnosis. He was found to have urinary bladder tumor. At that time, he was visiting his son in [place]. He ended up getting a biopsy. It was a limited biopsy because of his comorbidities. The thought of doing surgery for the procedure but it was too risky and that has been held off. He subsequently also followed up with [facility] for the urinary bladder tumor. With regards to his breathing, its overall the same. He states he is able to use his inhalers. Regarding the ventilator, he has had issues since starting to use it. He feels the pressure is too much. He ends up waking up early morning, very comfortable, suffocating, unable to tolerate the pressure. He has called his DME [facility] and they're supposed to follow up on him tomorrow. He uses oxygen with exertion, O2 saturation runs mid 90s. Has not been on antibiotics since last seen. Has nasal congestion and  postnasal drip.   Review of Systems  Constitutional: Negative for fever and unexpected weight change.  HENT: Positive for congestion, postnasal drip and sinus pressure. Negative for dental problem, ear pain, nosebleeds, rhinorrhea, sneezing, sore throat and trouble swallowing.   Eyes: Negative.  Negative for redness and itching.  Respiratory: Positive for shortness of breath. Negative for cough, chest tightness and wheezing.   Cardiovascular: Negative for palpitations and leg swelling.  Gastrointestinal: Negative for nausea and vomiting.  Endocrine: Negative.   Genitourinary: Negative.  Negative for dysuria.  Musculoskeletal: Positive for arthralgias and myalgias. Negative for joint swelling.  Skin: Negative.  Negative for rash.  Allergic/Immunologic: Negative.   Neurological: Positive for light-headedness. Negative for headaches.  Hematological: Bruises/bleeds easily.  Psychiatric/Behavioral: Negative.  Negative for dysphoric mood. The patient is not nervous/anxious.      Objective:   Physical Exam   Vitals:  Vitals:   09/09/15 1129  BP: (!) 142/72  Pulse: 89  SpO2: 97%  Weight: 134 lb (60.8 kg)  Height: 5\' 9"  (1.753 m)  We checked his O2 saturation on room air sitting: 94%.  Constitutional/General:  Pleasant, well-nourished, well-developed, not in any distress,  Comfortably seating.  Well kempt chronically ill.   Body mass index is 19.79 kg/m. Wt Readings from Last 3 Encounters:  09/09/15 134 lb (60.8 kg)  06/30/15 139 lb (63 kg)  06/23/15 139 lb (63 kg)    Neck circumference: 15 in  HEENT: Pupils equal and reactive to light and accommodation. Anicteric sclerae. Normal nasal mucosa.   No oral  lesions,  mouth clear,  oropharynx clear, no postnasal drip. (-) Oral thrush. No dental caries.  Airway - Mallampati class III  Neck: No masses. Midline trachea. No JVD, (-) LAD. (-) bruits appreciated.  Respiratory/Chest: Grossly normal chest. (-) deformity. (-) Accessory  muscle use.  Symmetric expansion. (-) Tenderness on palpation.  Resonant on percussion.  Diminished BS on both lower lung zones. (-) wheezing, crackles, rhonchi (-) egophony  Cardiovascular: Regular rate and  rhythm, heart sounds normal, no murmur or gallops, no peripheral edema  Gastrointestinal:  Normal bowel sounds. Soft, non-tender. No hepatosplenomegaly.  (-) masses.   Musculoskeletal:  Normal muscle tone. Normal gait.   Extremities: Grossly normal. (-) clubbing, cyanosis.  (-) edema  Skin: (-) rash,lesions seen.   Neurological/Psychiatric : alert, oriented to time, place, person. Normal mood and affect           Assessment & Plan:  Chronic respiratory failure with hypoxia and hypercapnia (HCC) Patient on oxygen 2 L 24 7. Prior to 8  months ago, he was just on 2 L when necessary. ABG done in 2012 showed CO2 of 48, unknown FiO2. ABG on 2L  (05/2015)  7.4/49/89 on 2L.  We checked his oxygen level on room air at sitting: 94%.  Suggested to try to keep his O2 saturation 88-90 percent at least. When sitting, he can turn off the oxygen as long as sats are acceptable. On walking and doing the treadmill, he can use oxygen 1 or 2 L to keep his saturation more than 88%.  COPD GOLD IV/ 02 dep /steroid dep Patient has severe/very severe emphysema. Last PFT with an FEV1 of 36%. Diffusion capacity of 48%. (05/04/2005) Patient currently is now on oxygen 2 L 24 7. The last 8 months, patient's COPD has been very difficult to control with frequent exacerbation requiring frequent office visits, prednisone, antibiotics. He is also now on oxygen 2 L 24/7. Prior to this, he was just on oxygen 2 L with exertion.  We started NIV Astral on this patient back in June. Since starting it, he has had issues tolerating it because the pressures too much. He usually wakes up early morning with too much pressure, unable to breathe, suffocating, uncomfortable.  Plan : 1. [facility] will visit him  in am. I will call the DME and mention regarding maybe trying Trilogy instead of astral and try a different mask. We also need to decrease the pressure to make it more tolerable for the patient. 2. Cont NIV for now with 2L o2. 3. If he does not tolerate NIV, plan to d/c NIV alltogether.  4. Currently he is on Symbicort, 160/4.5, 2 puffs twice a day and Spiriva respimat. I suggested maybe try Pulmicort nebulizer 0.5 twice a day and DuoNeb 4 times a day and see if the breathing gets better with that. I will need to touch base with Dr. [name] regarding this. 5. They also asked about daliresp. I don't think there is any indication for that at this point. Will reconsider in the future. 6. UTD with vaccines. Got flu vaccine.     Sinusitis, chronic Start flonase. D/c nasonex.     Return to clinic in 3-4 mos.   I spent at least 25 minutes with the patient today and more than 50% was spent counseling the  patient regarding disease and management and facilitating labs and medications.         Monica Becton, MD 09/09/2015, 1:57 PM [facility] Pager [phone] After 3 pm or if no answer, call [phone]

## 2015-09-09 NOTE — Patient Instructions (Addendum)
It was a pleasure taking care of you today!  You are diagnosed with Chronic Obstructive Pulmonary Disease or COPD.  COPD is a preventable and treatable disease that makes it difficult to empty air out of the lungs (airflow obstruction).  This can lead to shortness of breath.   Sometimes, when you have a lung infection, this can make your breathing worse, and will cause you to have a COPD flare-up or an acute exacerbation of COPD. Please call your primary care doctor or the office if you are having a COPD flare-up.   Smoking makes COPD worse.   Continue using her noninvasive ventilator for now. We will touch base with Dr. [name] regarding medication suggestion.   Return to clinic in  3-4 months.

## 2015-09-10 ENCOUNTER — Other Ambulatory Visit (HOSPITAL_COMMUNITY): Payer: Medicare Other

## 2015-09-10 ENCOUNTER — Telehealth: Payer: Self-pay | Admitting: Pulmonary Disease

## 2015-09-10 ENCOUNTER — Encounter (HOSPITAL_COMMUNITY): Admission: RE | Payer: Self-pay | Source: Ambulatory Visit

## 2015-09-10 ENCOUNTER — Ambulatory Visit (HOSPITAL_COMMUNITY): Admission: RE | Admit: 2015-09-10 | Payer: Medicare Other | Source: Ambulatory Visit | Admitting: Urology

## 2015-09-10 ENCOUNTER — Ambulatory Visit (HOSPITAL_COMMUNITY): Payer: Medicare Other

## 2015-09-10 SURGERY — CYSTOURETEROSCOPY, WITH STENT INSERTION
Anesthesia: General | Laterality: Left

## 2015-09-10 MED ORDER — BUDESONIDE 0.5 MG/2ML IN SUSP: 0.5000 mg | Freq: Two times a day (BID) | RESPIRATORY_TRACT | 3 refills | Status: DC

## 2015-09-10 MED ORDER — IPRATROPIUM-ALBUTEROL 0.5-2.5 (3) MG/3ML IN SOLN: 3.0000 mL | Freq: Four times a day (QID) | RESPIRATORY_TRACT | 3 refills | Status: DC | PRN

## 2015-09-10 NOTE — Telephone Encounter (Signed)
Ok with me 

## 2015-09-10 NOTE — Telephone Encounter (Signed)
Spoke with Juliann Pulse at [facility]-states that she and AD spoke yesterday about needing OV notes for patient about a Trilogy-AD informed her that he had left the office and that he would have his nurse fax the [place] notes today. I have confirmed in the [place] note that AD requested [facility] to visit patient today about this.   Faxed OV notes to [phone]. Nothing more needed at this time.

## 2015-09-10 NOTE — Telephone Encounter (Signed)
   I spoke with Dr. [name] re: trying pt on neb meds rather than MDIs. Spoke to wife and updated her of plan.  Can we pls order: 1. Pulmicort neb 0.5 mg 2x/day # 60 with 3 refills. Rinse mouth with each use 2. Duoneb neb 4x/d # 120 with 3 refills?   Thanks. Pls order through [facility]. They get o2 from that DME.  I will be around til 3pm today at the office.   Monica Becton, MD 09/10/2015, 1:56 PM [facility] Pager [phone] After 3 pm or if no answer, call [phone]

## 2015-09-10 NOTE — Telephone Encounter (Signed)
Dr [name] is gone and is not here to sign Rx's - Pulmicort and Duoneb [facility] spoke with answering service. There is no one available to take the call and there is no way to reach the [employer]. The on call person at [facility] stated that I would have to speak to the on call pharmacist but there is no way to get in touch with them because they do not have a direct number to be reached and they cannot return my call d/t our phones being turned off.  Spoke with patient, aware of the issue and that we are going to have to send this his local pharmacy.  Pt requests [facility] on [address]. These have been sent. Nothing further needed.  Will send to Dr Corrie Dandy as Juluis Rainier of the issue.

## 2015-09-10 NOTE — Telephone Encounter (Signed)
AD is no longer in the office today. Will route message to DOD in his absence.   MW please advise if you would sign rx for nebs, thanks

## 2015-09-12 ENCOUNTER — Inpatient Hospital Stay (HOSPITAL_COMMUNITY)
Admission: EM | Admit: 2015-09-12 | Discharge: 2015-09-16 | DRG: 313 | Disposition: A | Discharge status: Home / Self Care | Payer: Medicare Other | Attending: Internal Medicine | Admitting: Internal Medicine

## 2015-09-12 ENCOUNTER — Emergency Department (HOSPITAL_COMMUNITY): Payer: Medicare Other

## 2015-09-12 ENCOUNTER — Encounter (HOSPITAL_COMMUNITY): Payer: Self-pay

## 2015-09-12 DIAGNOSIS — I2510 Atherosclerotic heart disease of native coronary artery without angina pectoris: Secondary | ICD-10-CM | POA: Diagnosis present

## 2015-09-12 DIAGNOSIS — J9611 Chronic respiratory failure with hypoxia: Secondary | ICD-10-CM | POA: Diagnosis present

## 2015-09-12 DIAGNOSIS — Z7952 Long term (current) use of systemic steroids: Secondary | ICD-10-CM

## 2015-09-12 DIAGNOSIS — I2781 Cor pulmonale (chronic): Secondary | ICD-10-CM | POA: Diagnosis not present

## 2015-09-12 DIAGNOSIS — Z8249 Family history of ischemic heart disease and other diseases of the circulatory system: Secondary | ICD-10-CM

## 2015-09-12 DIAGNOSIS — R002 Palpitations: Secondary | ICD-10-CM

## 2015-09-12 DIAGNOSIS — I25709 Atherosclerosis of coronary artery bypass graft(s), unspecified, with unspecified angina pectoris: Secondary | ICD-10-CM | POA: Diagnosis present

## 2015-09-12 DIAGNOSIS — Z7951 Long term (current) use of inhaled steroids: Secondary | ICD-10-CM

## 2015-09-12 DIAGNOSIS — R079 Chest pain, unspecified: Principal | ICD-10-CM | POA: Diagnosis present

## 2015-09-12 DIAGNOSIS — R42 Dizziness and giddiness: Secondary | ICD-10-CM | POA: Diagnosis not present

## 2015-09-12 DIAGNOSIS — Z9981 Dependence on supplemental oxygen: Secondary | ICD-10-CM | POA: Diagnosis not present

## 2015-09-12 DIAGNOSIS — I4510 Unspecified right bundle-branch block: Secondary | ICD-10-CM | POA: Diagnosis present

## 2015-09-12 DIAGNOSIS — N400 Enlarged prostate without lower urinary tract symptoms: Secondary | ICD-10-CM | POA: Diagnosis present

## 2015-09-12 DIAGNOSIS — J9612 Chronic respiratory failure with hypercapnia: Secondary | ICD-10-CM | POA: Diagnosis present

## 2015-09-12 DIAGNOSIS — R404 Transient alteration of awareness: Secondary | ICD-10-CM | POA: Diagnosis not present

## 2015-09-12 DIAGNOSIS — F419 Anxiety disorder, unspecified: Secondary | ICD-10-CM | POA: Diagnosis present

## 2015-09-12 DIAGNOSIS — R109 Unspecified abdominal pain: Secondary | ICD-10-CM

## 2015-09-12 DIAGNOSIS — I739 Peripheral vascular disease, unspecified: Secondary | ICD-10-CM | POA: Diagnosis present

## 2015-09-12 DIAGNOSIS — I951 Orthostatic hypotension: Secondary | ICD-10-CM | POA: Diagnosis present

## 2015-09-12 DIAGNOSIS — J449 Chronic obstructive pulmonary disease, unspecified: Secondary | ICD-10-CM | POA: Diagnosis present

## 2015-09-12 DIAGNOSIS — E785 Hyperlipidemia, unspecified: Secondary | ICD-10-CM | POA: Diagnosis present

## 2015-09-12 DIAGNOSIS — Z7982 Long term (current) use of aspirin: Secondary | ICD-10-CM

## 2015-09-12 DIAGNOSIS — I10 Essential (primary) hypertension: Secondary | ICD-10-CM | POA: Diagnosis present

## 2015-09-12 DIAGNOSIS — K219 Gastro-esophageal reflux disease without esophagitis: Secondary | ICD-10-CM | POA: Diagnosis present

## 2015-09-12 DIAGNOSIS — R0789 Other chest pain: Secondary | ICD-10-CM | POA: Diagnosis not present

## 2015-09-12 DIAGNOSIS — Z87891 Personal history of nicotine dependence: Secondary | ICD-10-CM

## 2015-09-12 DIAGNOSIS — Z951 Presence of aortocoronary bypass graft: Secondary | ICD-10-CM

## 2015-09-12 HISTORY — DX: Benign prostatic hyperplasia without lower urinary tract symptoms: N40.0

## 2015-09-12 HISTORY — DX: Neoplasm of unspecified behavior of other genitourinary organ: D49.59

## 2015-09-12 LAB — CBC
HCT: 40.6 % (ref 39.0–52.0)
Hemoglobin: 13.5 g/dL (ref 13.0–17.0)
MCH: 32.1 pg (ref 26.0–34.0)
MCHC: 33.3 g/dL (ref 30.0–36.0)
MCV: 96.4 fL (ref 78.0–100.0)
Platelets: 198 10*3/uL (ref 150–400)
RBC: 4.21 MIL/uL — ABNORMAL LOW (ref 4.22–5.81)
RDW: 12.3 % (ref 11.5–15.5)
WBC: 7.6 10*3/uL (ref 4.0–10.5)

## 2015-09-12 LAB — URINALYSIS, ROUTINE W REFLEX MICROSCOPIC
Bilirubin Urine: NEGATIVE
Glucose, UA: NEGATIVE mg/dL
Hgb urine dipstick: NEGATIVE
Ketones, ur: NEGATIVE mg/dL
Leukocytes, UA: NEGATIVE
Nitrite: NEGATIVE
Protein, ur: NEGATIVE mg/dL
Specific Gravity, Urine: 1.009 (ref 1.005–1.030)
pH: 7 (ref 5.0–8.0)

## 2015-09-12 LAB — COMPREHENSIVE METABOLIC PANEL
ALT: 18 U/L (ref 17–63)
AST: 24 U/L (ref 15–41)
Albumin: 4.3 g/dL (ref 3.5–5.0)
Alkaline Phosphatase: 60 U/L (ref 38–126)
Anion gap: 8 (ref 5–15)
BUN: 12 mg/dL (ref 6–20)
CO2: 29 mmol/L (ref 22–32)
Calcium: 9.8 mg/dL (ref 8.9–10.3)
Chloride: 98 mmol/L — ABNORMAL LOW (ref 101–111)
Creatinine, Ser: 0.95 mg/dL (ref 0.61–1.24)
GFR calc Af Amer: >60 mL/min (ref >60)
GFR calc non Af Amer: >60 mL/min (ref >60)
Glucose, Bld: 113 mg/dL — ABNORMAL HIGH (ref 65–99)
Potassium: 4.3 mmol/L (ref 3.5–5.1)
Sodium: 135 mmol/L (ref 135–145)
Total Bilirubin: 0.6 mg/dL (ref 0.3–1.2)
Total Protein: 6.4 g/dL — ABNORMAL LOW (ref 6.5–8.1)

## 2015-09-12 LAB — I-STAT TROPONIN, ED: Troponin i, poc: 0.02 ng/mL (ref 0.00–0.08)

## 2015-09-12 LAB — TROPONIN I: Troponin I: <0.03 ng/mL (ref <0.03)

## 2015-09-12 LAB — PROTIME-INR
INR: 1.11
Prothrombin Time: 14.3 seconds (ref 11.4–15.2)

## 2015-09-12 LAB — MAGNESIUM: Magnesium: 1.9 mg/dL (ref 1.7–2.4)

## 2015-09-12 MED ORDER — NITROGLYCERIN 0.4 MG SL SUBL: 0.4000 mg | SUBLINGUAL_TABLET | SUBLINGUAL | Status: DC | PRN

## 2015-09-12 MED ORDER — MOMETASONE FURO-FORMOTEROL FUM 200-5 MCG/ACT IN AERO
2.0000 | INHALATION_SPRAY | Freq: Two times a day (BID) | RESPIRATORY_TRACT | Status: DC
  Filled 2015-09-12 (×2): qty 8.8

## 2015-09-12 NOTE — ED Provider Notes (Signed)
[facility] DEPT Provider Note   CSN: [id number] Arrival date & time: 09/12/15  2052     History   Chief Complaint Chief Complaint  Patient presents with  . Irregular Heart Beat    HPI [name] is a 82 y.o. male.  Pt presents from home because of elevated BP and CP.  Last night, pt had low BP, with systolic in the [id number].  He did not take his Toprol XL because of that.  Today, his BP has been elevated with systolics in the [id number] range.  Pt has a hx of CAD and is s/p CABG.  He has not had any recent cardiac stress or cath.  He does follow with Dr. Tamala Julian for cardiology.  En route, he did have some episodes of NSVT.       Past Medical History:  Diagnosis Date  . Asthma   . CAD (coronary artery disease)   . Carotid artery occlusion   . Chronic respiratory failure ([place])   . Chronic rhinitis   . COPD (chronic obstructive pulmonary disease) ([place])   . Hyperlipidemia   . Hypertension   . Peripheral vascular disease ([place])   . S/P CABG x 4   . Syncope and collapse   . Upper respiratory infection 08/2012    Patient Active Problem List   Diagnosis Date Noted  . Sinusitis, chronic 09/09/2015  . Chronic hypoxemic respiratory failure ([place]) 05/21/2015  . Dyspnea 04/15/2015  . Acute cor pulmonale ([place]) 03/18/2015  . Right calf pain 06/18/2014  . Fatigue 10/28/2012  . CAD (coronary artery disease)   . Peripheral vascular disease ([place])   . Hypersomnia, idiopathic 11/18/2010  . Essential hypertension   . Obstructive sleep apnea   . Rhinitis, nonallergic, chronic 01/29/2007  . COPD GOLD IV/ 02 dep /steroid dep 01/29/2007  . Chronic respiratory failure with hypoxia and hypercapnia ([place]) 01/29/2007  . Hyperlipidemia     Past Surgical History:  Procedure Laterality Date  . CATARACT EXTRACTION  11-20-11  . CORONARY ARTERY BYPASS GRAFT  2003  . LITHOTRIPSY    . PROSTATE BIOPSY  Oct. 2014       Home Medications    Prior to Admission medications   Medication Sig Start Date  End Date Taking? Authorizing Provider  ALPRAZolam (XANAX) 0.25 MG tablet TAKE 1 TABLET BY MOUTH EVERY 8 HOURS AS NEEDED FOR ANXIETY Patient taking differently: take 0.5 tablet twice daily 12/25/14  Yes Tanda Rockers, MD  aspirin 81 MG tablet Take 81 mg by mouth daily.   Yes Historical Provider, MD  atorvastatin (LIPITOR) 20 MG tablet TAKE 1 TABLET BY MOUTH EVERY DAY Patient taking differently: Take 1 tablet by mouth at bedtime 04/29/15  Yes Belva Crome, MD  budesonide (PULMICORT) 0.5 MG/2ML nebulizer solution Take 2 mLs (0.5 mg total) by nebulization 2 (two) times daily. 09/10/15  Yes Jose Shirl Harris, MD  Cholecalciferol (VITAMIN D3) 1000 UNITS CAPS Take 1 capsule (1,000 Units total) by mouth daily. Patient taking differently: Take 4,000 Units by mouth every morning.  03/14/11  Yes Tammy S Parrett, NP  finasteride (PROSCAR) 5 MG tablet Take 5 mg by mouth every morning.   Yes Historical Provider, MD  fluticasone (FLONASE) 50 MCG/ACT nasal spray Place 2 sprays into both nostrils daily.   Yes Historical Provider, MD  furosemide (LASIX) 20 MG tablet Take 1 tablet (20 mg total) by mouth daily as needed for fluid. Patient taking differently: Take 20 mg by mouth as needed (leg  swelling).  12/17/13  Yes Tanda Rockers, MD  loratadine (CLARITIN) 10 MG tablet Take 10 mg by mouth daily as needed (drippy nose and drainage). Reported on 05/24/2015   Yes Historical Provider, MD  Multiple Vitamin (MULTIVITAMIN) tablet Take 1 tablet by mouth daily.   Yes Historical Provider, MD  NITROSTAT 0.4 MG SL tablet DISSOLVE 1 TABLET UNDER THE TONGUE AS DIRECTED AND AS NEEDED FOR CHEST PAIN 05/13/15  Yes Belva Crome, MD  omeprazole (PRILOSEC) 40 MG capsule TAKE 1 CAPSULE(40 MG) BY MOUTH TWICE DAILY BEFORE A MEAL 06/21/15  Yes Tanda Rockers, MD  OXYGEN 2 l/m at bedtime and as needed for shortness of breath   Yes Historical Provider, MD  predniSONE (DELTASONE) 10 MG tablet TAKE 1 TABLET BY MOUTH DAILY OR AS DIRECTED Patient  taking differently: take 0.5 tablet daily 08/09/15  Yes Tanda Rockers, MD  SPIRIVA RESPIMAT 2.5 MCG/ACT AERS INHALE 2 PUFFS BY MOUTH EVERY DAY Patient taking differently: INHALE 2 PUFFS BY MOUTH EVERY MORNING 11/06/14  Yes Tanda Rockers, MD  SYMBICORT 160-4.5 MCG/ACT inhaler INHALE 2 PUFFS BY MOUTH EVERY MORNING AND THEN ANOTHER 2 PUFFS ABOUT 12 HOURS LATER 06/21/15  Yes Tanda Rockers, MD  TOPROL XL 25 MG 24 hr tablet TAKE 1 TABLET(25 MG) BY MOUTH DAILY 04/20/15  Yes Belva Crome, MD  XOPENEX HFA 45 MCG/ACT inhaler INHALE 2 PUFFS BY MOUTH EVERY 4 HOURS AS NEEDED FOR WHEEZING OR SHORTNESS OF BREATH 05/28/15  Yes Tanda Rockers, MD    Family History Family History  Problem Relation Age of Onset  . Heart disease Mother   . Breast cancer Mother   . Cancer Mother     Breast cancer  . Hypertension Mother   . Other Mother     AAA  and   Amputation  . Heart disease Father     Before age 66  . Heart attack Father   . Stroke Brother     x3, still living   . Peripheral vascular disease Brother   . Heart disease Brother   . Hyperlipidemia Brother   . Hypertension Brother   . Heart disease Brother   . Allergies Brother     Social History Social History  Substance Use Topics  . Smoking status: Former Smoker    Packs/day: 2.00    Years: 33.00    Types: Cigarettes    Quit date: 01/09/1982  . Smokeless tobacco: Never Used  . Alcohol use 0.0 oz/week     Comment: 1.5-3 oz daily     Allergies   Sulfonamide derivatives; Cefdinir; Ciprofloxacin; Nitrofurantoin; and Levaquin [levofloxacin]   Review of Systems Review of Systems  Cardiovascular: Positive for chest pain.  All other systems reviewed and are negative.    Physical Exam Updated Vital Signs BP 155/85   Pulse 67   Resp 20   Ht 5\' 9"  (1.753 m)   Wt 135 lb (61.2 kg)   SpO2 (!) 78%   BMI 19.94 kg/m   Physical Exam  Constitutional: He is oriented to person, place, and time. He appears well-developed and well-nourished.    HENT:  Head: Normocephalic and atraumatic.  Right Ear: External ear normal.  Left Ear: External ear normal.  Nose: Nose normal.  Mouth/Throat: Oropharynx is clear and moist.  Eyes: Conjunctivae and EOM are normal. Pupils are equal, round, and reactive to light.  Neck: Normal range of motion. Neck supple.  Cardiovascular: Normal rate, regular rhythm, normal heart sounds  and intact distal pulses.   Pulmonary/Chest: Effort normal and breath sounds normal.  Abdominal: Soft. Bowel sounds are normal.  Musculoskeletal: Normal range of motion.  Neurological: He is alert and oriented to person, place, and time.  Skin: Skin is warm and dry.  Psychiatric: He has a normal mood and affect. His behavior is normal. Judgment and thought content normal.  Nursing note and vitals reviewed.    ED Treatments / Results  Labs (all labs ordered are listed, but only abnormal results are displayed) Labs Reviewed  CBC - Abnormal; Notable for the following:       Result Value   RBC 4.21 (*)    All other components within normal limits  COMPREHENSIVE METABOLIC PANEL - Abnormal; Notable for the following:    Chloride 98 (*)    Glucose, Bld 113 (*)    Total Protein 6.4 (*)    All other components within normal limits  PROTIME-INR  TROPONIN I  URINALYSIS, ROUTINE W REFLEX MICROSCOPIC (NOT AT [facility])  MAGNESIUM  TROPONIN I  TROPONIN I  I-STAT TROPOININ, ED    EKG  EKG Interpretation  Date/Time:  Sunday September 12 2015 22:17:52 EDT Ventricular Rate:  91 PR Interval:    QRS Duration: 133 QT Interval:  387 QTC Calculation: 477 R Axis:   150 Text Interpretation:  Sinus rhythm Right bundle branch block Confirmed by Mikyah Alamo MD, Ciara Kagan ([id number]) on 09/12/2015 10:31:20 PM       Radiology Dg Chest 2 View  Result Date: 09/12/2015 CLINICAL DATA:  Acute onset of intermittent hypertension and hypotension. Irregular heartbeat. Central chest pressure. Initial encounter. EXAM: CHEST  2 VIEW COMPARISON:  Chest  radiograph performed 02/26/2014 FINDINGS: The lungs are hyperexpanded, with flattening of the hemidiaphragms, compatible with COPD. There is no evidence of focal opacification, pleural effusion or pneumothorax. The heart is normal in size; the patient is status post median sternotomy. No acute osseous abnormalities are seen. IMPRESSION: Findings of COPD.  Lungs remain otherwise clear. Electronically Signed   By: Garald Balding M.D.   On: 09/12/2015 22:12    Procedures Procedures (including critical care time)  Medications Ordered in ED Medications  nitroGLYCERIN (NITROSTAT) SL tablet 0.4 mg (not administered)  mometasone-formoterol (DULERA) 200-5 MCG/ACT inhaler 2 puff (not administered)     Initial Impression / Assessment and Plan / ED Course  I have reviewed the triage vital signs and the nursing notes.  Pertinent labs & imaging results that were available during my care of the patient were reviewed by me and considered in my medical decision making (see chart for details).  Clinical Course   Due to the abnormal EKG strips by EMS, I spoke with Dr. Radford Pax (cardiology).  She will come and evaluate patient.  She does not think the EKG shows NSVT.  She said it was PACs with aberrancy and 1 ventricular couplet.  She recommends observation stay with hospitalist admit.  Pt d/w Dr. Lorin Mercy (triad) who will admit pt for obs.   Final Clinical Impressions(s) / ED Diagnoses   Final diagnoses:  Palpitations  Chest pain, unspecified chest pain type    New Prescriptions New Prescriptions   No medications on file     Isla Pence, MD 09/13/15 [phone]

## 2015-09-12 NOTE — ED Triage Notes (Signed)
Brought from home via EMS for c/o elevated BP.  Per patient blood pressure was in the 70's yesterday so he held his toprol XL.  Reports BP has been elevated today at 200/100 and his heartbeat has been irregular.  C/O chest pressure across center of chest.  Wears oxygen all the time at home for COPD.

## 2015-09-13 ENCOUNTER — Encounter (HOSPITAL_COMMUNITY): Payer: Self-pay | Admitting: Internal Medicine

## 2015-09-13 DIAGNOSIS — J9611 Chronic respiratory failure with hypoxia: Secondary | ICD-10-CM

## 2015-09-13 DIAGNOSIS — K219 Gastro-esophageal reflux disease without esophagitis: Secondary | ICD-10-CM | POA: Diagnosis present

## 2015-09-13 DIAGNOSIS — I10 Essential (primary) hypertension: Secondary | ICD-10-CM

## 2015-09-13 DIAGNOSIS — I25810 Atherosclerosis of coronary artery bypass graft(s) without angina pectoris: Secondary | ICD-10-CM | POA: Diagnosis not present

## 2015-09-13 DIAGNOSIS — J9612 Chronic respiratory failure with hypercapnia: Secondary | ICD-10-CM | POA: Diagnosis not present

## 2015-09-13 DIAGNOSIS — E785 Hyperlipidemia, unspecified: Secondary | ICD-10-CM | POA: Diagnosis not present

## 2015-09-13 DIAGNOSIS — N400 Enlarged prostate without lower urinary tract symptoms: Secondary | ICD-10-CM | POA: Diagnosis present

## 2015-09-13 DIAGNOSIS — F419 Anxiety disorder, unspecified: Secondary | ICD-10-CM

## 2015-09-13 DIAGNOSIS — Z8249 Family history of ischemic heart disease and other diseases of the circulatory system: Secondary | ICD-10-CM | POA: Diagnosis not present

## 2015-09-13 DIAGNOSIS — I2781 Cor pulmonale (chronic): Secondary | ICD-10-CM | POA: Diagnosis not present

## 2015-09-13 DIAGNOSIS — I2510 Atherosclerotic heart disease of native coronary artery without angina pectoris: Secondary | ICD-10-CM | POA: Diagnosis not present

## 2015-09-13 DIAGNOSIS — Z9981 Dependence on supplemental oxygen: Secondary | ICD-10-CM | POA: Diagnosis not present

## 2015-09-13 DIAGNOSIS — Z951 Presence of aortocoronary bypass graft: Secondary | ICD-10-CM | POA: Diagnosis not present

## 2015-09-13 DIAGNOSIS — R0789 Other chest pain: Secondary | ICD-10-CM | POA: Diagnosis not present

## 2015-09-13 DIAGNOSIS — J449 Chronic obstructive pulmonary disease, unspecified: Secondary | ICD-10-CM | POA: Diagnosis not present

## 2015-09-13 DIAGNOSIS — R079 Chest pain, unspecified: Principal | ICD-10-CM

## 2015-09-13 DIAGNOSIS — I25708 Atherosclerosis of coronary artery bypass graft(s), unspecified, with other forms of angina pectoris: Secondary | ICD-10-CM

## 2015-09-13 DIAGNOSIS — I739 Peripheral vascular disease, unspecified: Secondary | ICD-10-CM | POA: Diagnosis not present

## 2015-09-13 LAB — TSH: TSH: 3.067 u[IU]/mL (ref 0.350–4.500)

## 2015-09-13 LAB — LIPID PANEL
Cholesterol: 135 mg/dL (ref 0–200)
HDL: 74 mg/dL (ref >40)
LDL Cholesterol: 52 mg/dL (ref 0–99)
Total CHOL/HDL Ratio: 1.8 RATIO
Triglycerides: 45 mg/dL (ref <150)
VLDL: 9 mg/dL (ref 0–40)

## 2015-09-13 LAB — TROPONIN I
Troponin I: 0.05 ng/mL (ref <0.03)
Troponin I: 0.05 ng/mL (ref <0.03)
Troponin I: 0.03 ng/mL (ref <0.03)

## 2015-09-13 MED ORDER — ENOXAPARIN SODIUM 40 MG/0.4ML ~~LOC~~ SOLN
40.0000 mg | SUBCUTANEOUS | Status: DC
  Filled 2015-09-13 (×4): qty 0.4

## 2015-09-13 MED ORDER — HYDRALAZINE HCL 25 MG PO TABS
25.0000 mg | ORAL_TABLET | Freq: Three times a day (TID) | ORAL | Status: DC
  Filled 2015-09-13 (×3): qty 1

## 2015-09-13 MED ORDER — BUDESONIDE 0.5 MG/2ML IN SUSP
0.5000 mg | Freq: Two times a day (BID) | RESPIRATORY_TRACT | Status: DC
  Administered 2015-09-13: 0.5 mg via RESPIRATORY_TRACT
  Filled 2015-09-13 (×4): qty 2

## 2015-09-13 MED ORDER — GI COCKTAIL ~~LOC~~: 30.0000 mL | Freq: Four times a day (QID) | ORAL | Status: DC | PRN

## 2015-09-13 MED ORDER — HYDRALAZINE HCL 20 MG/ML IJ SOLN
10.0000 mg | Freq: Four times a day (QID) | INTRAMUSCULAR | Status: DC | PRN
  Filled 2015-09-13: qty 1

## 2015-09-13 MED ORDER — PREDNISONE 5 MG PO TABS
5.0000 mg | ORAL_TABLET | Freq: Every day | ORAL | Status: DC
  Administered 2015-09-13: 5 mg via ORAL
  Filled 2015-09-13: qty 1

## 2015-09-13 MED ORDER — ASPIRIN EC 325 MG PO TBEC
325.0000 mg | DELAYED_RELEASE_TABLET | Freq: Every day | ORAL | Status: DC
  Administered 2015-09-13 – 2015-09-14 (×2): 325 mg via ORAL
  Filled 2015-09-13 (×2): qty 1

## 2015-09-13 MED ORDER — METOPROLOL SUCCINATE ER 25 MG PO TB24
25.0000 mg | ORAL_TABLET | Freq: Every day | ORAL | Status: DC
  Administered 2015-09-13 – 2015-09-16 (×4): 25 mg via ORAL
  Filled 2015-09-13 (×4): qty 1

## 2015-09-13 MED ORDER — ACETAMINOPHEN 325 MG PO TABS: 650.0000 mg | ORAL_TABLET | ORAL | Status: DC | PRN

## 2015-09-13 MED ORDER — HYDRALAZINE HCL 50 MG PO TABS: 50.0000 mg | ORAL_TABLET | Freq: Three times a day (TID) | ORAL | Status: DC

## 2015-09-13 MED ORDER — FLUTICASONE PROPIONATE 50 MCG/ACT NA SUSP
2.0000 | Freq: Every day | NASAL | Status: DC
  Administered 2015-09-15: 2 via NASAL
  Filled 2015-09-13: qty 16

## 2015-09-13 MED ORDER — MORPHINE SULFATE (PF) 2 MG/ML IV SOLN: 2.0000 mg | INTRAVENOUS | Status: DC | PRN

## 2015-09-13 MED ORDER — HYDRALAZINE HCL 20 MG/ML IJ SOLN: 10.0000 mg | INTRAMUSCULAR | Status: DC | PRN

## 2015-09-13 MED ORDER — SUCRALFATE 1 GM/10ML PO SUSP
1.0000 g | Freq: Three times a day (TID) | ORAL | Status: DC
Start: 2015-09-13 — End: 2015-09-16
  Administered 2015-09-13 – 2015-09-16 (×11): 1 g via ORAL
  Filled 2015-09-13 (×13): qty 10

## 2015-09-13 MED ORDER — LEVALBUTEROL HCL 0.63 MG/3ML IN NEBU
0.6300 mg | INHALATION_SOLUTION | RESPIRATORY_TRACT | Status: DC | PRN
  Filled 2015-09-13: qty 3

## 2015-09-13 MED ORDER — LEVALBUTEROL HCL 1.25 MG/0.5ML IN NEBU
1.2500 mg | INHALATION_SOLUTION | Freq: Once | RESPIRATORY_TRACT | Status: DC
  Filled 2015-09-13: qty 0.5

## 2015-09-13 MED ORDER — TIOTROPIUM BROMIDE MONOHYDRATE 18 MCG IN CAPS
18.0000 ug | ORAL_CAPSULE | Freq: Every day | RESPIRATORY_TRACT | Status: DC
  Administered 2015-09-13 – 2015-09-15 (×3): 18 ug via RESPIRATORY_TRACT
  Filled 2015-09-13: qty 5

## 2015-09-13 MED ORDER — VITAMIN B-1 100 MG PO TABS
100.0000 mg | ORAL_TABLET | Freq: Every day | ORAL | Status: DC
  Administered 2015-09-13 – 2015-09-16 (×4): 100 mg via ORAL
  Filled 2015-09-13 (×3): qty 1

## 2015-09-13 MED ORDER — ATORVASTATIN CALCIUM 40 MG PO TABS
40.0000 mg | ORAL_TABLET | Freq: Every day | ORAL | Status: DC
  Administered 2015-09-13 – 2015-09-15 (×3): 40 mg via ORAL
  Filled 2015-09-13 (×3): qty 1

## 2015-09-13 MED ORDER — LORATADINE 10 MG PO TABS: 10.0000 mg | ORAL_TABLET | Freq: Every day | ORAL | Status: DC | PRN

## 2015-09-13 MED ORDER — AMLODIPINE BESYLATE 10 MG PO TABS
10.0000 mg | ORAL_TABLET | Freq: Every day | ORAL | Status: DC
  Administered 2015-09-13: 10 mg via ORAL
  Filled 2015-09-13: qty 1

## 2015-09-13 MED ORDER — PANTOPRAZOLE SODIUM 40 MG PO TBEC
40.0000 mg | DELAYED_RELEASE_TABLET | Freq: Two times a day (BID) | ORAL | Status: DC
  Administered 2015-09-13 – 2015-09-16 (×7): 40 mg via ORAL
  Filled 2015-09-13 (×7): qty 1

## 2015-09-13 MED ORDER — ALPRAZOLAM 0.5 MG PO TABS
0.5000 mg | ORAL_TABLET | Freq: Three times a day (TID) | ORAL | Status: DC | PRN
  Administered 2015-09-13: 0.5 mg via ORAL
  Administered 2015-09-14 (×2): 0.25 mg via ORAL
  Administered 2015-09-15: 0.5 mg via ORAL
  Administered 2015-09-15: 0.25 mg via ORAL
  Filled 2015-09-13 (×5): qty 1

## 2015-09-13 MED ORDER — FOLIC ACID 1 MG PO TABS
1.0000 mg | ORAL_TABLET | Freq: Every day | ORAL | Status: DC
  Administered 2015-09-13 – 2015-09-16 (×4): 1 mg via ORAL
  Filled 2015-09-13 (×4): qty 1

## 2015-09-13 MED ORDER — FINASTERIDE 5 MG PO TABS
5.0000 mg | ORAL_TABLET | Freq: Every morning | ORAL | Status: DC
  Administered 2015-09-13 – 2015-09-16 (×4): 5 mg via ORAL
  Filled 2015-09-13 (×4): qty 1

## 2015-09-13 MED ORDER — ONDANSETRON HCL 4 MG/2ML IJ SOLN: 4.0000 mg | Freq: Four times a day (QID) | INTRAMUSCULAR | Status: DC | PRN

## 2015-09-13 NOTE — Consult Note (Signed)
CARDIOLOGY CONSULT NOTE   Patient ID: [name] MRN: [id number], DOB/AGE: 1931/80/06   Admit date: 09/12/2015 Date of Consult: 09/13/2015   Primary Physician:  Melinda Crutch, MD Primary Cardiologist: Dr. Smith/Dr. Debara Pickett  Pt. Profile  HTN, HLD, PAD, carotid artery disease, COPD w/ cor pulmonale and CAD s/p 5v CABG 09/13/2001 with LIMA to LAD, reverse sequential SVG to OM1/OM 2, sequential SVG to distal RCA and PDA by Dr. Servando Snare presented with episodic hypotension, fatigue and bilateral chest/shoulder discomfort.   Problem List  Past Medical History:  Diagnosis Date  . Asthma   . BPH (benign prostatic hyperplasia)    severe; s/p multiple biopsies  . CAD (coronary artery disease)   . Carotid artery occlusion   . Chronic rhinitis   . COPD (chronic obstructive pulmonary disease) ([facility])    2L [place] O2  . Hyperlipidemia   . Hypertension   . Peripheral vascular disease ([place])   . 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 Syncope and collapse   . Ureteral tumor 08/2015   had endoscopic procedure for evaluation, unable to reach for biopsy    Past Surgical History:  Procedure Laterality Date  . CATARACT EXTRACTION  11-20-11  . CORONARY ARTERY BYPASS GRAFT  2003  . LITHOTRIPSY    . PROSTATE BIOPSY  Oct. 2014     Allergies  Allergies  Allergen Reactions  . Sulfonamide Derivatives Swelling    REACTION: facial/tongue swelling  . Cefdinir Diarrhea    REACTION: diarrhea  . Ciprofloxacin Itching    Red itchy hands  . Nitrofurantoin Swelling    Swollen hands  . Levaquin [Levofloxacin] Itching and Rash    HPI   [name] is a pleasant HTN, HLD, PAD, carotid artery disease, COPD w/ cor pulmonale and CAD s/p 5v CABG 09/13/2001 with LIMA to LAD, reverse sequential SVG to OM1/OM 2, sequential SVG to distal RCA and PDA by Dr. Servando Snare. Post CABG procedure was complicated by bleeding into the mediastinum require repeat exploration. Since then, he has not had any further angina and has not had another cardiac  catheterization. His previous angina was described as substernal chest pain radiating down the left arm. He continued to exercise on treadmill every other day. He says he does still walk only at 1 mile per hour since he has severe COPD requiring oxygen. He has not experienced any exertional symptom recently. His last echocardiogram obtained in 12/2014 showed mild concentric LVH, EF 50%, mild left atrial enlargement, mild MR/TR. He has severe uncontrolled COPD require essentially 24 7 home O2. He was also started on Astral noninvasive ventilator in the recent month. For the past 3-4 months, he has been placed on scheduled steroid for recurrent flare. He says his breathing has been fairly stable in the past several month. For the past 3 weeks, he has been complaining of significant fatigue. He denies any recent fever, chill, cough, lower extremity edema, orthopnea or PND.  On Saturday, he noticed his blood pressure was low in the 70s. He did take his Toprol-XL around 10:50 AM the day before. He did feel somewhat clammy, so he held his Toprol-XL on Saturday. When he woke up on Sunday, he noticed his blood pressure was very high around 216/110. He also noticed burning sensation near bilateral shoulder/upper chest area. He says the location of the burning sensation is different from the previous angina. It is also more noticeable on deep inspiration. Due to significantly elevated blood pressure, he eventually sought medical attention at  [facility]. Some mentions his heart rate was around 116 despite the fact that he was resting. Denies significant palpitation episodes. On arrival to [facility], his blood pressure was still high, however has came down since. He has been restarted on Toprol-XL 25 mg daily. Per report, he had nonsustained VT, this was reviewed by overnight fellow Dr. Radford Pax, it was felt was in fact PACs with aberrancy. Initial EKG on arrival showed right bundle branch block with heart  rate in the 90s, however repeat EKG on the following morning on 09/13/2015 shows narrrow QRS. I have reviewed this with Dr. Caryl Comes, who felt the right bundle branch block is likely driven by changing the heart rate. Cardiology has been consulted for chest pain.    Inpatient Medications  . aspirin EC  325 mg Oral Daily  . atorvastatin  40 mg Oral q1800  . budesonide  0.5 mg Nebulization BID  . enoxaparin (LOVENOX) injection  40 mg Subcutaneous Q24H  . finasteride  5 mg Oral q morning - 10a  . fluticasone  2 spray Each Nare Daily  . metoprolol succinate  25 mg Oral Daily  . mometasone-formoterol  2 puff Inhalation BID  . pantoprazole  40 mg Oral BID  . predniSONE  5 mg Oral Q breakfast  . sucralfate  1 g Oral TID WC & HS  . tiotropium  18 mcg Inhalation Daily    Family History Family History  Problem Relation Age of Onset  . Heart disease Mother   . Breast cancer Mother   . Cancer Mother     Breast cancer  . Hypertension Mother   . Other Mother     AAA  and   Amputation  . Heart disease Father     Before age 22  . Heart attack Father   . Stroke Brother     x3, still living   . Peripheral vascular disease Brother   . Heart disease Brother   . Hyperlipidemia Brother   . Hypertension Brother   . Heart disease Brother   . Allergies Brother      Social History Social History   Social History  . Marital status: Married    Spouse name: N/A  . Number of children: 2  . Years of education: N/A   Occupational History  . 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 Smoking status: Former Smoker    Packs/day: 2.00    Years: 33.00    Types: Cigarettes    Quit date: 01/09/1982  . Smokeless tobacco: Never Used  . Alcohol use 0.0 oz/week     Comment: 1.5-3 oz daily in the past, minimal use in the last few months  . Drug use: No  . Sexual activity: Not on file   Other Topics Concern  . Not on file   Social History Narrative     Pt lives at home with his spouse.           Review of Systems  General:  No chills, fever, night sweats or weight changes.  Cardiovascular:  No edema, orthopnea, palpitations, paroxysmal nocturnal dyspnea. +chest pain, chronic dyspnea Dermatological: No rash, lesions/masses Respiratory: No cough Urologic: No hematuria, dysuria Abdominal:   No nausea, vomiting, diarrhea, bright red blood per rectum, melena, or hematemesis Neurologic:  No visual changes, wkns, changes in mental status. All other systems reviewed and are otherwise negative except as noted above.  Physical Exam  Blood pressure 109/64, pulse 76, temperature 97.9 F (36.6 C), temperature source Oral, resp. rate 18, height 5\' 7"  (1.702 m), weight 130 lb 3.2 oz (59.1 kg), SpO2 98 %.  General: Pleasant, NAD Psych: Normal affect. Neuro: Alert and oriented X 3. Moves all extremities spontaneously. HEENT: Normal  Neck: Supple without bruits or JVD. Lungs:  Resp regular and unlabored. Decreased breath sound on bilaterally Heart: RRR no s3, s4. 1/6 systolic murmurs. Abdomen: Soft, non-tender, non-distended, BS + x 4.  Extremities: No clubbing, cyanosis or edema. DP/PT/Radials 2+ and equal bilaterally.  Labs   Recent Labs  09/12/15 2122 09/13/15 0133 09/13/15 0320  TROPONINI <0.03 0.05* 0.05*   Lab Results  Component Value Date   WBC 7.6 09/12/2015   HGB 13.5 09/12/2015   HCT 40.6 09/12/2015   MCV 96.4 09/12/2015   PLT 198 09/12/2015     Recent Labs Lab 09/12/15 2122  NA 135  K 4.3  CL 98*  CO2 29  BUN 12  CREATININE 0.95  CALCIUM 9.8  PROT 6.4*  BILITOT 0.6  ALKPHOS 60  ALT 18  AST 24  GLUCOSE 113*   Lab Results  Component Value Date   CHOL 135 09/13/2015   HDL 74 09/13/2015   LDLCALC 52 09/13/2015   TRIG 45 09/13/2015   Lab Results  Component Value Date   DDIMER (H) 02/22/2010    0.68        AT THE INHOUSE ESTABLISHED CUTOFF VALUE OF 0.48 ug/mL FEU, THIS ASSAY HAS BEEN  DOCUMENTED IN THE LITERATURE TO HAVE A SENSITIVITY AND NEGATIVE PREDICTIVE VALUE OF AT LEAST 98 TO 99%.  THE TEST RESULT SHOULD BE CORRELATED WITH AN ASSESSMENT OF THE CLINICAL PROBABILITY OF DVT / VTE.    Radiology/Studies  Dg Chest 2 View  Result Date: 09/12/2015 CLINICAL DATA:  Acute onset of intermittent hypertension and hypotension. Irregular heartbeat. Central chest pressure. Initial encounter. EXAM: CHEST  2 VIEW COMPARISON:  Chest radiograph performed 02/26/2014 FINDINGS: The lungs are hyperexpanded, with flattening of the hemidiaphragms, compatible with COPD. There is no evidence of focal opacification, pleural effusion or pneumothorax. The heart is normal in size; the patient is status post median sternotomy. No acute osseous abnormalities are seen. IMPRESSION: Findings of COPD.  Lungs remain otherwise clear. Electronically Signed   By: Garald Balding M.D.   On: 09/12/2015 22:12    ECG  2 EKG, EKG obtained yesterday showed sinus rhythm with right bundle-branch block, repeat EKG today showed sinus rhythm without significant bundle-branch block. No significant ST-T wave changes noted.  ASSESSMENT AND PLAN  1. Atypical chest pain: Serial troponin minimally elevated at 0.05, no clear-cut and was observed. Symptom very atypical. We'll discuss with M.D., likely consider outpatient Myoview or inpatient  2. Episodic hypotension with fatigue: Unclear if hypertension is truly related to fatigue, per patient he has felt fatigued for the past 3 weeks and only noted hypotension episodes since Saturday. May need to cut back his Toprol-XL to half the previous dose. Check TSH given severe fatigue for 3 weeks.  3. CAD with 5 vessel CABG, no obvious exertional symptoms recently. This echocardiogram obtained in December 2016 showed EF 50%. We'll not repeat echo at this time.  4. COPD w/ cor pulmonale: On chronic steroid at home 5 mg daily for the past 4 months. Also home O2, per pulmonology note,  started on Astral NIV. No obvious flare noted on physical exam  5. HTN: With episodic hypertension as mentioned above  6. HLD:  Fasting lipid panel this morning showed well-controlled cholesterol, triglyceride, HDL and LDL.  7. PAD: Last ultrasound June 2017, left ABI 0.61, monophasic waveform noted bilaterally, abnormal bilateral TBI as well. Followed by vascular surgery.  8. carotid artery disease: 40-59% right ICA, less than 40% left ICA stenosis on carotid ultrasound in June 2017.   Signed, Almyra Deforest, PA-C 09/13/2015, 9:11 AM   The patient was seen and examined, and I agree with the history, physical exam, assessment and plan as documented above which has been discussed with [name], with modifications as noted below. Pt with h/o CABG and severe COPD with aforementioned presentation (chest pain, variable BP's, fatigue). Troponins unremarkable and TSH normal. ECG as described above. Echo with relatively normal LVEF in 12/2014. Would recommend outpatient nuclear stress test. Patient and his wife in agreement with plan.  Kate Sable, MD, [facility]  09/13/2015 9:28 AM

## 2015-09-13 NOTE — ED Notes (Signed)
Patient stable for transport at this time.  Denies any pain.  EMT will transport on telemetry

## 2015-09-13 NOTE — Care Management Obs Status (Signed)
[facility] NOTIFICATION   Patient Details  Name: [name] MRN: [id number] Date of Birth: [birth year]   Medicare Observation Status Notification Given:  Yes    Bethena Roys, RN 09/13/2015, 12:48 PM

## 2015-09-13 NOTE — Progress Notes (Signed)
PROGRESS NOTE                                                                                                                                                                                                             Patient Demographics:    [name], is a 82 y.o. male, DOB - 11-13-1929, [id number]  Admit date - 09/12/2015   Admitting Physician Karmen Bongo, MD  Outpatient Primary MD for the patient is  Melinda Crutch, MD  LOS - 0  Chief Complaint  Patient presents with  . Irregular Heart Beat       Brief Narrative    [name] is a 82 y.o. male with medical history significant of CAD with remote h/o CABG, O2-dependent COPD, HTN, and HLD presenting with BP issues and chest tightness.     Subjective:    [name] today has, No headache, No chest pain, No abdominal pain - No Nausea, No new weakness tingling or numbness, No Cough - SOB.  Tonic abdominal fullness and bilateral shoulder pressure.   Assessment  & Plan :     1.Atypical chest pain likely due to uncontrolled hypertension. Chest pain resolved, ruled out for MI, troponin flat trend non-ACS pattern and borderline, EKG nonacute, seen by cardiology, blood pressure medications adjusted, increase activity if stable and symptom-free discharge in the morning.  2. Uncontrolled hypertension. Blood pressure medications adjusted will monitor, in the past has had issues with orthostasis as well so we will monitor closely.  3. Dyslipidemia. Continue home dose statin.  4. COPD with chronic hypoxic respiratory failure. Stable continue supportive care with home oxygen and nebulizer treatments as needed.  5. History of BPH and ureteral tremor. Follow with primary urologist outpatient as before.  6. GERD. Placed on Protonix and Carafate. Follow with GI outpatient if symptoms continue.  7. History of anxiety. Continue home dose Xanax.  8. HX alcohol abuse quit  several months ago. Counseled to continue to abstain. We will place on folic acid and thiamine.    Family Communication  :  Wife  Code Status :  Full  Diet : Heart Healthy  Disposition Plan  :  Home in am  Consults  :  Cards  Procedures  :    DVT Prophylaxis  :  Lovenox    Lab Results  Component Value  Date   PLT 198 09/12/2015    Inpatient Medications  Scheduled Meds: . amLODipine  10 mg Oral Daily  . aspirin EC  325 mg Oral Daily  . atorvastatin  40 mg Oral q1800  . budesonide  0.5 mg Nebulization BID  . enoxaparin (LOVENOX) injection  40 mg Subcutaneous Q24H  . finasteride  5 mg Oral q morning - 10a  . fluticasone  2 spray Each Nare Daily  . hydrALAZINE  25 mg Oral Q8H  . metoprolol succinate  25 mg Oral Daily  . mometasone-formoterol  2 puff Inhalation BID  . pantoprazole  40 mg Oral BID  . predniSONE  5 mg Oral Q breakfast  . sucralfate  1 g Oral TID WC & HS  . tiotropium  18 mcg Inhalation Daily   Continuous Infusions:  PRN Meds:.acetaminophen, ALPRAZolam, gi cocktail, hydrALAZINE, levalbuterol, loratadine, morphine injection, ondansetron (ZOFRAN) IV  Antibiotics  :    Anti-infectives    None         Objective:   Vitals:   09/13/15 0156 09/13/15 0521 09/13/15 0914 09/13/15 1048  BP: 130/79 109/64 (!) 161/87 (!) 180/86  Pulse: 76 76 63 71  Resp: 18 18 18    Temp: 97.6 F (36.4 C) 97.9 F (36.6 C) 97.6 F (36.4 C)   TempSrc: Oral Oral Oral   SpO2: 98% 98% 99% 100%  Weight: 59.1 kg (130 lb 3.2 oz)     Height: 5\' 7"  (1.702 m)       Wt Readings from Last 3 Encounters:  09/13/15 59.1 kg (130 lb 3.2 oz)  09/09/15 60.8 kg (134 lb)  06/30/15 63 kg (139 lb)     Intake/Output Summary (Last 24 hours) at 09/13/15 1108 Last data filed at 09/13/15 0930  Gross per 24 hour  Intake              360 ml  Output              300 ml  Net               60 ml     Physical Exam  Awake Alert, Oriented X 3, No new F.N deficits, Normal  affect [place].AT,PERRAL Supple Neck,No JVD, No cervical lymphadenopathy appriciated.  Symmetrical Chest wall movement, Good air movement bilaterally, CTAB RRR,No Gallops,Rubs or new Murmurs, No Parasternal Heave +ve B.Sounds, Abd Soft, No tenderness, No organomegaly appriciated, No rebound - guarding or rigidity. No Cyanosis, Clubbing or edema, No new Rash or bruise       Data Review:    CBC  Recent Labs Lab 09/12/15 2122  WBC 7.6  HGB 13.5  HCT 40.6  PLT 198  MCV 96.4  MCH 32.1  MCHC 33.3  RDW 12.3    Chemistries   Recent Labs Lab 09/12/15 2122  NA 135  K 4.3  CL 98*  CO2 29  GLUCOSE 113*  BUN 12  CREATININE 0.95  CALCIUM 9.8  MG 1.9  AST 24  ALT 18  ALKPHOS 60  BILITOT 0.6   ------------------------------------------------------------------------------------------------------------------  Recent Labs  09/13/15 0320  CHOL 135  HDL 74  LDLCALC 52  TRIG 45  CHOLHDL 1.8    ------------------------------------------------------------------------------------------------------------------  Recent Labs  09/13/15 0320  TSH 3.067   ------------------------------------------------------------------------------------------------------------------ No results for input(s): VITAMINB12, FOLATE, FERRITIN, TIBC, IRON, RETICCTPCT in the last 72 hours.  Coagulation profile  Recent Labs Lab 09/12/15 2122  INR 1.11    No results for input(s): DDIMER in the last  72 hours.  Cardiac Enzymes  Recent Labs Lab 09/13/15 0133 09/13/15 0320 09/13/15 0849  TROPONINI 0.05* 0.05* 0.03*   ------------------------------------------------------------------------------------------------------------------ No results found for: BNP  Micro Results No results found for this or any previous visit (from the past 240 hour(s)).  Radiology Reports Dg Chest 2 View  Result Date: 09/12/2015 CLINICAL DATA:  Acute onset of intermittent hypertension and hypotension. Irregular  heartbeat. Central chest pressure. Initial encounter. EXAM: CHEST  2 VIEW COMPARISON:  Chest radiograph performed 02/26/2014 FINDINGS: The lungs are hyperexpanded, with flattening of the hemidiaphragms, compatible with COPD. There is no evidence of focal opacification, pleural effusion or pneumothorax. The heart is normal in size; the patient is status post median sternotomy. No acute osseous abnormalities are seen. IMPRESSION: Findings of COPD.  Lungs remain otherwise clear. Electronically Signed   By: Garald Balding M.D.   On: 09/12/2015 22:12    Time Spent in minutes  30   Neko Boyajian K M.D on 09/13/2015 at 11:08 AM  Between 7am to 7pm - Pager - [phone]  After 7pm go to [email or web address] - password [facility]  [facility] -  Office  [phone]

## 2015-09-13 NOTE — H&P (Signed)
History and Physical    [name] [id number] DOB: Jun 06, 1929 DOA: 09/12/2015  PCP:  Melinda Crutch, MD Consultants:  [name] - pulmonology; Mallie Mussel Smith/[place] - cardiology; urology - [place] Patient coming from: home; lives with wife  Chief Complaint: BP derangement  HPI: [name] is a 82 y.o. male with medical history significant of CAD with remote h/o CABG, O2-dependent COPD, HTN, and HLD presenting with BP issues and chest tightness.   Patient reports that he has been feeling bad for 2 weeks - complained to both pulm and Dr. Wynonia Lawman recently.  Friday afternoon, he started having unexplained periods of low BP - as low as 80/40 (usually 130/60).  BP even worse yesterday - 77/45 when he got up Saturday AM.  Drank lots of water and checked BP throughout the day without improvement.  Significant orthostatic hypotension, as well - held his Toprol.  This AM, he woke up with BP in reasonable range but as the day progressed it went higher and higher - up to 216/100.  If sitting still and breathing carefully, it would improve.  He did experience chest tightness which he thinks might have been related to nerves.  The higher the BP, the more nervous he was and the more chest tightness he had.  He did have worsening SOB with chest tightness.  No diaphoresis.  Has severe COPD which is getting worse annually.  Wears 2L home O2.  Pulmonary thinks maybe he has CO2 retention in the mornings from overnight O2.  Sometimes SOB despite normal O2 sats.  Has had GI upset intermittently with occasional bouts of constipation for a month or more.  Walks daily on treadmill until 2 years ago.  Last stress test was about 2005.  No catheterization either.    Last echo was in 12/16.  ED Course: Per Dr. Gilford Raid: Due to the abnormal EKG strips by EMS, I spoke with Dr. Radford Pax (cardiology).  She will come and evaluate patient.  She does not think the EKG shows NSVT.  She said it was PACs with aberrancy and 1 ventricular  couplet.  She recommends observation stay with hospitalist admit.  Pt d/w Dr. Lorin Mercy ([facility]) who will admit pt for obs.  Review of Systems: As per HPI; otherwise 10 point review of systems reviewed and negative.   Ambulatory Status:  Ambulates without assistance  Past Medical History:  Diagnosis Date  . Asthma   . BPH (benign prostatic hyperplasia)    severe; s/p multiple biopsies  . CAD (coronary artery disease)   . Carotid artery occlusion   . Chronic rhinitis   . COPD (chronic obstructive pulmonary disease) (HCC)    2L [place] O2  . Hyperlipidemia   . Hypertension   . Peripheral vascular disease ([place])   . 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 Syncope and collapse   . Ureteral tumor 08/2015   had endoscopic procedure for evaluation, unable to reach for biopsy    Past Surgical History:  Procedure Laterality Date  . CATARACT EXTRACTION  11-20-11  . CORONARY ARTERY BYPASS GRAFT  2003  . LITHOTRIPSY    . PROSTATE BIOPSY  Oct. 2014    Social History   Social History  . Marital status: Married    Spouse name: N/A  . Number of children: 2  . Years of education: N/A   Occupational History  . 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 Smoking status: Former Smoker  Packs/day: 2.00    Years: 33.00    Types: Cigarettes    Quit date: 01/09/1982  . Smokeless tobacco: Never Used  . Alcohol use 0.0 oz/week     Comment: 1.5-3 oz daily in the past, minimal use in the last few months  . Drug use: No  . Sexual activity: Not on file   Other Topics Concern  . Not on file   Social History Narrative   Pt lives at home with his spouse.          Allergies  Allergen Reactions  . Sulfonamide Derivatives Swelling    REACTION: facial/tongue swelling  . Cefdinir Diarrhea    REACTION: diarrhea  . Ciprofloxacin Itching    Red itchy hands  . Nitrofurantoin Swelling    Swollen hands  . Levaquin [Levofloxacin] Itching and Rash    Family  History  Problem Relation Age of Onset  . Heart disease Mother   . Breast cancer Mother   . Cancer Mother     Breast cancer  . Hypertension Mother   . Other Mother     AAA  and   Amputation  . Heart disease Father     Before age 40  . Heart attack Father   . Stroke Brother     x3, still living   . Peripheral vascular disease Brother   . Heart disease Brother   . Hyperlipidemia Brother   . Hypertension Brother   . Heart disease Brother   . Allergies Brother     Prior to Admission medications   Medication Sig Start Date End Date Taking? Authorizing Provider  ALPRAZolam (XANAX) 0.25 MG tablet TAKE 1 TABLET BY MOUTH EVERY 8 HOURS AS NEEDED FOR ANXIETY Patient taking differently: take 0.5 tablet twice daily 12/25/14  Yes Tanda Rockers, MD  aspirin 81 MG tablet Take 81 mg by mouth daily.   Yes Historical Provider, MD  atorvastatin (LIPITOR) 20 MG tablet TAKE 1 TABLET BY MOUTH EVERY DAY Patient taking differently: Take 1 tablet by mouth at bedtime 04/29/15  Yes Belva Crome, MD  budesonide (PULMICORT) 0.5 MG/2ML nebulizer solution Take 2 mLs (0.5 mg total) by nebulization 2 (two) times daily. 09/10/15  Yes Jose Shirl Harris, MD  Cholecalciferol (VITAMIN D3) 1000 UNITS CAPS Take 1 capsule (1,000 Units total) by mouth daily. Patient taking differently: Take 4,000 Units by mouth every morning.  03/14/11  Yes Tammy S Parrett, NP  finasteride (PROSCAR) 5 MG tablet Take 5 mg by mouth every morning.   Yes Historical Provider, MD  fluticasone (FLONASE) 50 MCG/ACT nasal spray Place 2 sprays into both nostrils daily.   Yes Historical Provider, MD  furosemide (LASIX) 20 MG tablet Take 1 tablet (20 mg total) by mouth daily as needed for fluid. Patient taking differently: Take 20 mg by mouth as needed (leg swelling).  12/17/13  Yes Tanda Rockers, MD  loratadine (CLARITIN) 10 MG tablet Take 10 mg by mouth daily as needed (drippy nose and drainage). Reported on 05/24/2015   Yes Historical Provider, MD    Multiple Vitamin (MULTIVITAMIN) tablet Take 1 tablet by mouth daily.   Yes Historical Provider, MD  NITROSTAT 0.4 MG SL tablet DISSOLVE 1 TABLET UNDER THE TONGUE AS DIRECTED AND AS NEEDED FOR CHEST PAIN 05/13/15  Yes Belva Crome, MD  omeprazole (PRILOSEC) 40 MG capsule TAKE 1 CAPSULE(40 MG) BY MOUTH TWICE DAILY BEFORE A MEAL 06/21/15  Yes Tanda Rockers, MD  OXYGEN 2 l/m at  bedtime and as needed for shortness of breath   Yes Historical Provider, MD  predniSONE (DELTASONE) 10 MG tablet TAKE 1 TABLET BY MOUTH DAILY OR AS DIRECTED Patient taking differently: take 0.5 tablet daily 08/09/15  Yes Tanda Rockers, MD  SPIRIVA RESPIMAT 2.5 MCG/ACT AERS INHALE 2 PUFFS BY MOUTH EVERY DAY Patient taking differently: INHALE 2 PUFFS BY MOUTH EVERY MORNING 11/06/14  Yes Tanda Rockers, MD  SYMBICORT 160-4.5 MCG/ACT inhaler INHALE 2 PUFFS BY MOUTH EVERY MORNING AND THEN ANOTHER 2 PUFFS ABOUT 12 HOURS LATER 06/21/15  Yes Tanda Rockers, MD  TOPROL XL 25 MG 24 hr tablet TAKE 1 TABLET(25 MG) BY MOUTH DAILY 04/20/15  Yes Belva Crome, MD  XOPENEX HFA 45 MCG/ACT inhaler INHALE 2 PUFFS BY MOUTH EVERY 4 HOURS AS NEEDED FOR WHEEZING OR SHORTNESS OF BREATH 05/28/15  Yes Tanda Rockers, MD    Physical Exam: Vitals:   09/13/15 0015 09/13/15 0030 09/13/15 0045 09/13/15 0100  BP: 152/96 (!) 155/103 169/92 143/83  Pulse: 77 74 79 72  Resp: 15 20 17    SpO2: 98% 98% 97% 95%  Weight:      Height:         General: Appears calm and comfortable and is NAD; he is somewhat anxious appearing Eyes:  PERRL, EOMI, normal lids, iris ENT: extremely hard of hearing, normal lips & tongue, mmm Neck:  no LAD, masses or thyromegaly Cardiovascular:  RRR with occasional PVCs, no m/r/g. No LE edema.  Respiratory:  CTA bilaterally, no w/r/r. Normal respiratory effort. Abdomen:  soft, ntnd, NABS Skin:  no rash or induration seen on limited exam Musculoskeletal:  grossly normal tone BUE/BLE, good ROM, no bony abnormality Psychiatric:   grossly normal mood and affect, speech fluent and appropriate, AOx3 Neurologic:  CN 2-12 grossly intact, moves all extremities in coordinated fashion, sensation intact  Labs on Admission: I have personally reviewed following labs and imaging studies  CBC:  Recent Labs Lab 09/12/15 2122  WBC 7.6  HGB 13.5  HCT 40.6  MCV 96.4  PLT 99991111   Basic Metabolic Panel:  Recent Labs Lab 09/12/15 2122  NA 135  K 4.3  CL 98*  CO2 29  GLUCOSE 113*  BUN 12  CREATININE 0.95  CALCIUM 9.8  MG 1.9   GFR: Estimated Creatinine Clearance: 50.1 mL/min (by C-G formula based on SCr of 0.95 mg/dL). Liver Function Tests:  Recent Labs Lab 09/12/15 2122  AST 24  ALT 18  ALKPHOS 60  BILITOT 0.6  PROT 6.4*  ALBUMIN 4.3   No results for input(s): LIPASE, AMYLASE in the last 168 hours. No results for input(s): AMMONIA in the last 168 hours. Coagulation Profile:  Recent Labs Lab 09/12/15 2122  INR 1.11   Cardiac Enzymes:  Recent Labs Lab 09/12/15 2122  TROPONINI <0.03   BNP (last 3 results)  Recent Labs  04/15/15 1118  PROBNP 71.0   HbA1C: No results for input(s): HGBA1C in the last 72 hours. CBG: No results for input(s): GLUCAP in the last 168 hours. Lipid Profile: No results for input(s): CHOL, HDL, LDLCALC, TRIG, CHOLHDL, LDLDIRECT in the last 72 hours. Thyroid Function Tests: No results for input(s): TSH, T4TOTAL, FREET4, T3FREE, THYROIDAB in the last 72 hours. Anemia Panel: No results for input(s): VITAMINB12, FOLATE, FERRITIN, TIBC, IRON, RETICCTPCT in the last 72 hours. Urine analysis:    Component Value Date/Time   COLORURINE YELLOW 09/12/2015 2246   APPEARANCEUR CLEAR 09/12/2015 2246   LABSPEC 1.009 09/12/2015  [place] 7.0 09/12/2015 2246   GLUCOSEU NEGATIVE 09/12/2015 2246   HGBUR NEGATIVE 09/12/2015 2246   BILIRUBINUR NEGATIVE 09/12/2015 2246   KETONESUR NEGATIVE 09/12/2015 2246   PROTEINUR NEGATIVE 09/12/2015 2246   NITRITE NEGATIVE 09/12/2015  2246   LEUKOCYTESUR NEGATIVE 09/12/2015 2246    Creatinine Clearance: Estimated Creatinine Clearance: 50.1 mL/min (by C-G formula based on SCr of 0.95 mg/dL).  Sepsis Labs: @LABRCNTIP (procalcitonin:4,lacticidven:4) )No results found for this or any previous visit (from the past 240 hour(s)).   Radiological Exams on Admission: Dg Chest 2 View  Result Date: 09/12/2015 CLINICAL DATA:  Acute onset of intermittent hypertension and hypotension. Irregular heartbeat. Central chest pressure. Initial encounter. EXAM: CHEST  2 VIEW COMPARISON:  Chest radiograph performed 02/26/2014 FINDINGS: The lungs are hyperexpanded, with flattening of the hemidiaphragms, compatible with COPD. There is no evidence of focal opacification, pleural effusion or pneumothorax. The heart is normal in size; the patient is status post median sternotomy. No acute osseous abnormalities are seen. IMPRESSION: Findings of COPD.  Lungs remain otherwise clear. Electronically Signed   By: Garald Balding M.D.   On: 09/12/2015 22:12    EKG: Independently reviewed.  NSR with rate 91; RBBB with no evidence of acute ischemia  Assessment/Plan Principal Problem:   Chest pain Active Problems:   Hyperlipidemia   Essential hypertension   Chronic respiratory failure with hypoxia and hypercapnia (HCC)   CAD (coronary artery disease)   BPH (benign prostatic hyperplasia)   GERD (gastroesophageal reflux disease)   Anxiety   Chest pain -Patient with substernal chest tightness that worsened with elevated BP; he thought it was related to anxiety. -En route, EMS thought he has NSVT.  Cardiology reviewed strips and thinks it was an aberrant atrial rhythm. -CXR unremarkable.   -Initial cardiac enzymes negative.   -EKG not indicative of acute ischemia.   -TIMI risk score is 3; which predicts a 14 days risk of death, recurrent MI, or urgent revascularization of 13.2%.  -Will plan to place in observation status on telemetry to rule out ACS by  overnight observation.  -cycle cardiac enzymes q6h x 3 and repeat EKG in AM -ASA 325 mg PO daily -morphine given -On home Lipitor 20mg ; will increase to 40 mg for now and check FLP in AM -Will also check TSH -Cardiology consultation in AM - Dr. Tamala Julian or Wynonia Lawman notified via Inbox message to the [place] -Patient without stress test in years, likely to benefit from repeat.  Will defer to cardiology.  HLD -As above, increase Lipitor dose for now and recheck FLP  HTN -Uncertain why patient had initial low BP and orthostasis, but suspect that patient overcompensated by skipping Toprol, which resulted in current suboptimal control -Will continue home Toprol  -Add prn IV hydralazine for SBP >180  Chronic respiratory failure -Appears to be stable but chronically worsening over time -Followed by pulm -Continue home meds and [place] O2  BPH with urologic tumor -Has significantly enlarged prostate by pt report -Also with ureteral tumor -Undergoing evaluation, possibly malignant but COPD limits ability to biopsy/treat  GERD -Patient with c/o midepigastric pain on an ongoing basis -Take Prilosec BID currently -Will change to Protonix 40 mg BID -Will also add Carafate -May need outpatient GI evaluation for possible endoscopy   Anxiety -Acknowledges anxiety and does appear to be mildly anxious -Will continue home Xanax -May benefit from SSRI   DVT prophylaxis: Lovenox  Code Status:  Full - confirmed with patient Family Communication: None present  Disposition Plan:  Home once clinically improved Consults called: Cardiology in AM via cardsmaster inbox message  Admission status: Observation, telemetry    Karmen Bongo MD [facility]  If 7PM-7AM, please contact night-coverage [email or web address] Password [email or web address]  09/13/2015, 1:18 AM

## 2015-09-13 NOTE — Progress Notes (Signed)
Patient complains of a radiating warmth or glow from his shoulders down to his knees  Syd Newsome A Archivist, RN

## 2015-09-13 NOTE — Progress Notes (Signed)
Pt refuses neb treatments and MDI treatments. Pt states that he doesn't wish to take them. Pt is stable at this time no wheezing or increase WOB/SOB noted. Breath sounds clear and diminished

## 2015-09-14 ENCOUNTER — Observation Stay (HOSPITAL_COMMUNITY): Payer: Medicare Other

## 2015-09-14 DIAGNOSIS — N400 Enlarged prostate without lower urinary tract symptoms: Secondary | ICD-10-CM

## 2015-09-14 DIAGNOSIS — Z7951 Long term (current) use of inhaled steroids: Secondary | ICD-10-CM | POA: Diagnosis not present

## 2015-09-14 DIAGNOSIS — J9611 Chronic respiratory failure with hypoxia: Secondary | ICD-10-CM | POA: Diagnosis not present

## 2015-09-14 DIAGNOSIS — Z87891 Personal history of nicotine dependence: Secondary | ICD-10-CM | POA: Diagnosis not present

## 2015-09-14 DIAGNOSIS — F419 Anxiety disorder, unspecified: Secondary | ICD-10-CM | POA: Diagnosis present

## 2015-09-14 DIAGNOSIS — I10 Essential (primary) hypertension: Secondary | ICD-10-CM | POA: Diagnosis not present

## 2015-09-14 DIAGNOSIS — Z951 Presence of aortocoronary bypass graft: Secondary | ICD-10-CM | POA: Diagnosis not present

## 2015-09-14 DIAGNOSIS — Z8249 Family history of ischemic heart disease and other diseases of the circulatory system: Secondary | ICD-10-CM | POA: Diagnosis not present

## 2015-09-14 DIAGNOSIS — I2510 Atherosclerotic heart disease of native coronary artery without angina pectoris: Secondary | ICD-10-CM | POA: Diagnosis not present

## 2015-09-14 DIAGNOSIS — I951 Orthostatic hypotension: Secondary | ICD-10-CM | POA: Diagnosis present

## 2015-09-14 DIAGNOSIS — E785 Hyperlipidemia, unspecified: Secondary | ICD-10-CM | POA: Diagnosis present

## 2015-09-14 DIAGNOSIS — R079 Chest pain, unspecified: Secondary | ICD-10-CM | POA: Diagnosis present

## 2015-09-14 DIAGNOSIS — J9612 Chronic respiratory failure with hypercapnia: Secondary | ICD-10-CM | POA: Diagnosis present

## 2015-09-14 DIAGNOSIS — I4510 Unspecified right bundle-branch block: Secondary | ICD-10-CM | POA: Diagnosis present

## 2015-09-14 DIAGNOSIS — K219 Gastro-esophageal reflux disease without esophagitis: Secondary | ICD-10-CM | POA: Diagnosis not present

## 2015-09-14 DIAGNOSIS — I2781 Cor pulmonale (chronic): Secondary | ICD-10-CM | POA: Diagnosis present

## 2015-09-14 DIAGNOSIS — Z7952 Long term (current) use of systemic steroids: Secondary | ICD-10-CM | POA: Diagnosis not present

## 2015-09-14 DIAGNOSIS — Z7982 Long term (current) use of aspirin: Secondary | ICD-10-CM | POA: Diagnosis not present

## 2015-09-14 DIAGNOSIS — Z9981 Dependence on supplemental oxygen: Secondary | ICD-10-CM | POA: Diagnosis not present

## 2015-09-14 DIAGNOSIS — J449 Chronic obstructive pulmonary disease, unspecified: Secondary | ICD-10-CM | POA: Diagnosis present

## 2015-09-14 DIAGNOSIS — I739 Peripheral vascular disease, unspecified: Secondary | ICD-10-CM | POA: Diagnosis present

## 2015-09-14 DIAGNOSIS — R109 Unspecified abdominal pain: Secondary | ICD-10-CM | POA: Diagnosis not present

## 2015-09-14 MED ORDER — HYDROCORTISONE NA SUCCINATE PF 100 MG IJ SOLR
100.0000 mg | Freq: Once | INTRAMUSCULAR | Status: DC
  Filled 2015-09-14: qty 2

## 2015-09-14 MED ORDER — ASPIRIN EC 81 MG PO TBEC
81.0000 mg | DELAYED_RELEASE_TABLET | Freq: Every day | ORAL | Status: DC
  Administered 2015-09-15 – 2015-09-16 (×2): 81 mg via ORAL
  Filled 2015-09-14 (×2): qty 1

## 2015-09-14 MED ORDER — FLUDROCORTISONE 0.1 MG/ML ORAL SUSPENSION: 0.1000 mg | ORAL | Status: DC

## 2015-09-14 MED ORDER — FLUDROCORTISONE ACETATE 0.1 MG PO TABS
0.1000 mg | ORAL_TABLET | ORAL | Status: DC
  Administered 2015-09-15: 0.1 mg via ORAL
  Filled 2015-09-14 (×2): qty 1

## 2015-09-14 MED ORDER — SODIUM CHLORIDE 0.9 % IV BOLUS (SEPSIS)
500.0000 mL | Freq: Once | INTRAVENOUS | Status: AC
  Administered 2015-09-14: 500 mL via INTRAVENOUS

## 2015-09-14 MED ORDER — PREDNISONE 20 MG PO TABS
20.0000 mg | ORAL_TABLET | Freq: Every day | ORAL | Status: DC
  Administered 2015-09-15 – 2015-09-16 (×2): 20 mg via ORAL
  Filled 2015-09-14 (×2): qty 1

## 2015-09-14 MED ORDER — AMLODIPINE BESYLATE 5 MG PO TABS: 5.0000 mg | ORAL_TABLET | Freq: Every day | ORAL | Status: DC

## 2015-09-14 MED ORDER — HYDROCORTISONE NA SUCCINATE PF 100 MG IJ SOLR
100.0000 mg | Freq: Once | INTRAMUSCULAR | Status: AC
  Administered 2015-09-14: 100 mg via INTRAVENOUS
  Filled 2015-09-14: qty 2

## 2015-09-14 MED ORDER — COSYNTROPIN 0.25 MG IJ SOLR: 0.2500 mg | Freq: Once | INTRAMUSCULAR | Status: DC

## 2015-09-14 NOTE — Progress Notes (Signed)
PROGRESS NOTE                                                                                                                                                                                                             Patient Demographics:    [name], is a 82 y.o. male, DOB - May 20, 1929, [id number]  Admit date - 09/12/2015   Admitting Physician Karmen Bongo, MD  Outpatient Primary MD for the patient is  Melinda Crutch, MD  LOS - 0  Chief Complaint  Patient presents with  . Irregular Heart Beat       Brief Narrative    [name] is a 82 y.o. male with medical history significant of CAD with remote h/o CABG, O2-dependent COPD, HTN, and HLD presenting with BP issues and chest tightness.     Subjective:    [name] today has, No headache, No chest pain, No abdominal pain - No Nausea, No new weakness tingling or numbness, No Cough - SOB.  Tonic abdominal fullness and bilateral shoulder pressure.   Assessment  & Plan :     1.Atypical chest pain likely due to uncontrolled hypertension. Chest pain resolved, ruled out for MI, troponin flat trend non-ACS pattern and borderline, EKG nonacute, seen by cardiology, blood pressure medications adjusted, Further workup only if wanted by cardiology.  2. His supine blood pressures with history of orthostasis. Blood pressure medications adjusted , Norvasc was added to home regimen however he did get orthostatic again which she has done in the outpatient setting as well, since he is chronically on prednisone I think he has developed some adrenal insufficiency, discussed his case with endocrinologist Dr. Loanne Drilling, one dose of IV hydrocortisone, increase home dose prednisone, and Florinef, IV fluid bolus and monitor orthostatics. With chronic prednisone use no point in checking cosyntropin stim test per Dr. Loanne Drilling which I concur with.  3. Dyslipidemia. Continue home dose  statin.  4. COPD with chronic hypoxic respiratory failure. Stable continue supportive care with home oxygen and nebulizer treatments as needed.  5. History of BPH and ureteral tremor. Follow with primary urologist outpatient as before.  6. GERD. Placed on Protonix and Carafate. Follow with GI outpatient if symptoms continue.  7. History of anxiety. Continue home dose Xanax.  8. HX alcohol abuse quit several months ago. Counseled to continue to  abstain. Have placed him on folic acid and thiamine.    Family Communication  :  Wife  Code Status :  Full  Diet : Heart Healthy  Disposition Plan  :  Home in am  Consults  :  Cards  Procedures  :    DVT Prophylaxis  :  Lovenox    Lab Results  Component Value Date   PLT 198 09/12/2015    Inpatient Medications  Scheduled Meds: . amLODipine  10 mg Oral Daily  . aspirin EC  325 mg Oral Daily  . atorvastatin  40 mg Oral q1800  . budesonide  0.5 mg Nebulization BID  . enoxaparin (LOVENOX) injection  40 mg Subcutaneous Q24H  . finasteride  5 mg Oral q morning - 10a  . [START ON 09/15/2015] fludrocortisone  0.1 mg Oral Q M,W,F-HD  . fluticasone  2 spray Each Nare Daily  . folic acid  1 mg Oral Daily  . hydrocortisone sod succinate (SOLU-CORTEF) inj  100 mg Intravenous Once  . metoprolol succinate  25 mg Oral Daily  . mometasone-formoterol  2 puff Inhalation BID  . pantoprazole  40 mg Oral BID  . [START ON 09/15/2015] predniSONE  20 mg Oral Q breakfast  . sodium chloride  500 mL Intravenous Once  . sucralfate  1 g Oral TID WC & HS  . thiamine  100 mg Oral Daily  . tiotropium  18 mcg Inhalation Daily   Continuous Infusions:  PRN Meds:.acetaminophen, ALPRAZolam, gi cocktail, hydrALAZINE, levalbuterol, loratadine, morphine injection, ondansetron (ZOFRAN) IV  Antibiotics  :    Anti-infectives    None         Objective:   Vitals:   09/13/15 1954 09/13/15 2356 09/14/15 0533 09/14/15 1022  BP: 128/63 (!) 106/59 (!) 120/55  121/62  Pulse: 65 65 (!) 58 71  Resp: 20 16 18    Temp: 97.9 F (36.6 C) 97.9 F (36.6 C) 97.9 F (36.6 C)   TempSrc: Oral Oral Oral   SpO2: 97% 98% 99%   Weight:   57.3 kg (126 lb 6.4 oz)   Height:        Wt Readings from Last 3 Encounters:  09/14/15 57.3 kg (126 lb 6.4 oz)  09/09/15 60.8 kg (134 lb)  06/30/15 63 kg (139 lb)     Intake/Output Summary (Last 24 hours) at 09/14/15 1027 Last data filed at 09/14/15 0930  Gross per 24 hour  Intake              920 ml  Output             1575 ml  Net             -655 ml     Physical Exam  Awake Alert, Oriented X 3, No new F.N deficits, Normal affect [place].AT,PERRAL Supple Neck,No JVD, No cervical lymphadenopathy appriciated.  Symmetrical Chest wall movement, Good air movement bilaterally, CTAB RRR,No Gallops,Rubs or new Murmurs, No Parasternal Heave +ve B.Sounds, Abd Soft, No tenderness, No organomegaly appriciated, No rebound - guarding or rigidity. No Cyanosis, Clubbing or edema, No new Rash or bruise       Data Review:    CBC  Recent Labs Lab 09/12/15 2122  WBC 7.6  HGB 13.5  HCT 40.6  PLT 198  MCV 96.4  MCH 32.1  MCHC 33.3  RDW 12.3    Chemistries   Recent Labs Lab 09/12/15 2122  NA 135  K 4.3  CL 98*  CO2 29  GLUCOSE 113*  BUN 12  CREATININE 0.95  CALCIUM 9.8  MG 1.9  AST 24  ALT 18  ALKPHOS 60  BILITOT 0.6   ------------------------------------------------------------------------------------------------------------------  Recent Labs  09/13/15 0320  CHOL 135  HDL 74  LDLCALC 52  TRIG 45  CHOLHDL 1.8    ------------------------------------------------------------------------------------------------------------------  Recent Labs  09/13/15 0320  TSH 3.067   ------------------------------------------------------------------------------------------------------------------ No results for input(s): VITAMINB12, FOLATE, FERRITIN, TIBC, IRON, RETICCTPCT in the last 72  hours.  Coagulation profile  Recent Labs Lab 09/12/15 2122  INR 1.11    No results for input(s): DDIMER in the last 72 hours.  Cardiac Enzymes  Recent Labs Lab 09/13/15 0133 09/13/15 0320 09/13/15 0849  TROPONINI 0.05* 0.05* 0.03*   ------------------------------------------------------------------------------------------------------------------ No results found for: BNP  Micro Results No results found for this or any previous visit (from the past 240 hour(s)).  Radiology Reports Dg Chest 2 View  Result Date: 09/12/2015 CLINICAL DATA:  Acute onset of intermittent hypertension and hypotension. Irregular heartbeat. Central chest pressure. Initial encounter. EXAM: CHEST  2 VIEW COMPARISON:  Chest radiograph performed 02/26/2014 FINDINGS: The lungs are hyperexpanded, with flattening of the hemidiaphragms, compatible with COPD. There is no evidence of focal opacification, pleural effusion or pneumothorax. The heart is normal in size; the patient is status post median sternotomy. No acute osseous abnormalities are seen. IMPRESSION: Findings of COPD.  Lungs remain otherwise clear. Electronically Signed   By: Garald Balding M.D.   On: 09/12/2015 22:12   Dg Abd Portable 1v  Result Date: 09/14/2015 CLINICAL DATA:  Abdominal pain EXAM: PORTABLE ABDOMEN - 1 VIEW COMPARISON:  06/16/2015 FINDINGS: Nonobstructive bowel gas pattern. No free air organomegaly. Diffuse vascular calcifications. No acute bony abnormality. Visualized lung bases clear. IMPRESSION: No acute findings. Electronically Signed   By: Rolm Baptise M.D.   On: 09/14/2015 08:50    Time Spent in minutes  30   Yashika Mask K M.D on 09/14/2015 at 10:27 AM  Between 7am to 7pm - Pager - [phone]  After 7pm go to [email or web address] - password [facility]  [facility] -  Office  [phone]

## 2015-09-14 NOTE — Progress Notes (Deleted)
   09/14/15 1240 09/14/15 1243  Orthostatic Lying   BP- Lying 124/57 --   Pulse- Lying 58 --   Orthostatic Sitting  BP- Sitting 110/68 --   Pulse- Sitting 68 --   Orthostatic Standing at 0 minutes  BP- Standing at 0 minutes (!) 83/48 --   Pulse- Standing at 0 minutes 71 --   Orthostatic Standing at 3 minutes  BP- Standing at 3 minutes --  (!) 83/47  Pulse- Standing at 3 minutes --  74

## 2015-09-14 NOTE — Progress Notes (Signed)
Subjective:  Patient well-known to me and switched care over to me from his previous doctor.  He is quite anxious and is very concerned about his health.  He has significant COPD and limiting activities because of his COPD.  Normally not much in the way of chest pain.  Was seen last week with some orthostasis.  He then became concerned over the weekend and had some labile blood pressures and was admitted.  He had atypical chest pain.  Continues with some orthostasis.  Objective:  Vital Signs in the last 24 hours: BP 121/62 (BP Location: Left Arm)   Pulse 71   Temp 97.9 F (36.6 C) (Oral)   Resp 18   Ht 5\' 7"  (1.702 m)   Wt 57.3 kg (126 lb 6.4 oz) Comment: scale b  SpO2 99%   BMI 19.80 kg/m   Physical Exam: Orthostatic VS for the past 24 hrs:  BP- Lying Pulse- Lying BP- Sitting Pulse- Sitting BP- Standing at 0 minutes Pulse- Standing at 0 minutes  09/14/15 1240 124/57 58 110/68 68 (!) 83/48 71  09/14/15 0829 148/68 78 124/68 75 98/51 82   Pleasant male in no acute distress sitting up at bedside wearing oxygen Lungs:  Clear Cardiac:  Regular rhythm, normal S1 and S2, no S3 Extremities:  No edema present  Intake/Output from previous day: 09/04 0701 - 09/05 0700 In: 680 [P.O.:680] Out: 1575 [Urine:1575]  Weight Filed Weights   09/12/15 2123 09/13/15 0156 09/14/15 0533  Weight: 61.2 kg (135 lb) 59.1 kg (130 lb 3.2 oz) 57.3 kg (126 lb 6.4 oz)    Lab Results: Basic Metabolic Panel:  Recent Labs  09/12/15 2122  NA 135  K 4.3  CL 98*  CO2 29  GLUCOSE 113*  BUN 12  CREATININE 0.95   CBC:  Recent Labs  09/12/15 2122  WBC 7.6  HGB 13.5  HCT 40.6  MCV 96.4  PLT 198   Cardiac Enzymes: Troponin (Point of Care Test)  Recent Labs  09/12/15 2121  TROPIPOC 0.02   Cardiac Panel (last 3 results)  Recent Labs  09/13/15 0133 09/13/15 0320 09/13/15 0849  TROPONINI 0.05* 0.05* 0.03*    Telemetry: Sinus with right bundle branch block and occasional aberrant  beats  Assessment/Plan:  1.  His chest pain is atypical for ischemia-I don't see much value in a stress test and the patient agrees also 2.  Severe COPD with some element of cor pulmonale 3.  Significant orthostasis-I would recommend that his blood pressure medicines be dosed based on his standing blood pressures.  He also would benefit from wearing thigh-high support hose     W. Doristine Church  MD [facility]  09/14/2015, 1:02 PM

## 2015-09-14 NOTE — Progress Notes (Addendum)
   09/14/15 1240 09/14/15 1243  Orthostatic Lying   BP- Lying 124/57 --   Pulse- Lying 58 --   Orthostatic Sitting  BP- Sitting 110/68 --   Pulse- Sitting 68 --   Orthostatic Standing at 0 minutes  BP- Standing at 0 minutes (!) 83/48 --   Pulse- Standing at 0 minutes 71 --   Orthostatic Standing at 3 minutes  BP- Standing at 3 minutes --  (!) 83/47  Pulse- Standing at 3 minutes --  74   Dr. Candiss Norse informed of orthostatic vital signs above via text page

## 2015-09-14 NOTE — Progress Notes (Signed)
Patient and spouse asked for explanation of POC, MD Educated on new medication as well as Pharmacy Consult completed and Son which is a Tax adviser was updated per Md. Patient has decided to not to take medication at this time. D/t to improvement in blood pressure after 500 cc bolus.

## 2015-09-14 NOTE — Progress Notes (Addendum)
   09/14/15 0829  Orthostatic Lying   BP- Lying 148/68  Pulse- Lying 78  Orthostatic Sitting  BP- Sitting 124/68  Pulse- Sitting 75  Orthostatic Standing at 0 minutes  BP- Standing at 0 minutes 98/51  Pulse- Standing at 0 minutes 82  Orthostatic Standing at 3 minutes  BP- Standing at 3 minutes (!) 89/51  Pulse- Standing at 3 minutes 82   Dr. Candiss Norse informed of above orthostatic vital signs at 0830 and orders in progress

## 2015-09-15 ENCOUNTER — Telehealth: Payer: Self-pay | Admitting: Pulmonary Disease

## 2015-09-15 DIAGNOSIS — K219 Gastro-esophageal reflux disease without esophagitis: Secondary | ICD-10-CM

## 2015-09-15 MED ORDER — SODIUM CHLORIDE 0.9 % IV BOLUS (SEPSIS)
500.0000 mL | Freq: Once | INTRAVENOUS | Status: AC
  Administered 2015-09-15: 500 mL via INTRAVENOUS

## 2015-09-15 MED ORDER — HYDROCORTISONE NA SUCCINATE PF 100 MG IJ SOLR
100.0000 mg | Freq: Three times a day (TID) | INTRAMUSCULAR | Status: DC
  Administered 2015-09-15: 100 mg via INTRAVENOUS
  Filled 2015-09-15 (×2): qty 2

## 2015-09-15 MED ORDER — SODIUM CHLORIDE 0.9 % IV SOLN
INTRAVENOUS | Status: DC
  Administered 2015-09-15 (×2): via INTRAVENOUS

## 2015-09-15 MED ORDER — SALINE SPRAY 0.65 % NA SOLN
1.0000 | NASAL | Status: DC | PRN
  Filled 2015-09-15: qty 44

## 2015-09-15 NOTE — Final Progress Note (Signed)
Patient alert and oriented 2 l o2 dep at home with spouse at the bedside, Improvement in blood pressures walked in hallway with 1 assist. IV fluids to run for 24 hours then ortho b/p at [place] discharge in the am.

## 2015-09-15 NOTE — Telephone Encounter (Signed)
Patient calling to let Dr. Corrie Dandy know that he is in the hospital. He has been in the hospital for a couple of days due to his blood pressure.  His blood pressure was as low as 74/50 so he was taken to ER and admitted.  He was going to be released today, but they decided to keep him another night.  He is not sure if he will be released tomorrow or not.  He stated that he is not having any Pulmonary related issues but that he would contact Dr. Corrie Dandy if he has any pulmonary problems.  I informed patient that we have doctors from our office that do rounds at the hospital if he were to have any pulmonary related issues.  He was not aware and was grateful to know that our providers are at the hospital.  Advised patient to contact our office to let us know how he is doing when he is released.  FYI to Dr. Corrie Dandy

## 2015-09-15 NOTE — Progress Notes (Signed)
PROGRESS NOTE                                                                                                                                                                                                             Patient Demographics:    [name], is a 82 y.o. male, DOB - February 01, 1929, [id number]  Admit date - 09/12/2015   Admitting Physician Karmen Bongo, MD  Outpatient Primary MD for the patient is  Melinda Crutch, MD  LOS - 1  Chief Complaint  Patient presents with  . Irregular Heart Beat       Brief Narrative    [name] is a 82 y.o. male with medical history significant of CAD with remote h/o CABG, O2-dependent COPD, HTN, and HLD presenting with BP issues and chest tightness.     Subjective:    [name] today has, No headache, No chest pain, No abdominal pain - No Nausea, No new weakness tingling or numbness, No Cough - SOB.  Tonic abdominal fullness and bilateral shoulder pressure.   Assessment  & Plan :     1.Atypical chest pain likely due to uncontrolled hypertension. Chest pain resolved, ruled out for MI, troponin flat trend non-ACS pattern and borderline, EKG nonacute, seen by cardiology, blood pressure medications adjusted, Further workup only if wanted by cardiology.  2. His supine blood pressures with history of orthostasis. Blood pressure medications adjusted , Norvasc was added to home regimen however he did get orthostatic again which she has done in the outpatient setting as well, since he is chronically on prednisone I think he has developed some adrenal insufficiency, discussed his case with endocrinologist Dr. Loanne Drilling, place on IV hydrocortisone, increase home dose prednisone, add Florinef, IVF and monitor orthostatics. With chronic prednisone use no point in checking cosyntropin stim test per Dr. Loanne Drilling which I concur with.  3. Dyslipidemia. Continue home dose statin.  4. COPD with  chronic hypoxic respiratory failure. Stable continue supportive care with home oxygen and nebulizer treatments as needed.  5. History of BPH and ureteral tremor. Follow with primary urologist outpatient as before.  6. GERD. Placed on Protonix and Carafate. Follow with GI outpatient if symptoms continue.  7. History of anxiety. Continue home dose Xanax.  8. HX alcohol abuse quit several months ago. Counseled to continue to abstain. Have placed  him on folic acid and thiamine.    Family Communication  :  Wife  Code Status :  Full  Diet : Heart Healthy  Disposition Plan  :  Home in am  Consults  :  Cards  Procedures  :    DVT Prophylaxis  :  Lovenox    Lab Results  Component Value Date   PLT 198 09/12/2015    Inpatient Medications  Scheduled Meds: . aspirin EC  81 mg Oral Daily  . atorvastatin  40 mg Oral q1800  . budesonide  0.5 mg Nebulization BID  . enoxaparin (LOVENOX) injection  40 mg Subcutaneous Q24H  . finasteride  5 mg Oral q morning - 10a  . fludrocortisone  0.1 mg Oral Q M,W,F-HD  . fluticasone  2 spray Each Nare Daily  . folic acid  1 mg Oral Daily  . hydrocortisone sod succinate (SOLU-CORTEF) inj  100 mg Intravenous Q8H  . metoprolol succinate  25 mg Oral Daily  . mometasone-formoterol  2 puff Inhalation BID  . pantoprazole  40 mg Oral BID  . predniSONE  20 mg Oral Q breakfast  . sodium chloride  500 mL Intravenous Once  . sodium chloride  500 mL Intravenous Once  . sucralfate  1 g Oral TID WC & HS  . thiamine  100 mg Oral Daily  . tiotropium  18 mcg Inhalation Daily   Continuous Infusions:  PRN Meds:.acetaminophen, ALPRAZolam, gi cocktail, levalbuterol, loratadine, morphine injection, ondansetron (ZOFRAN) IV  Antibiotics  :    Anti-infectives    None         Objective:   Vitals:   09/14/15 2009 09/14/15 2100 09/15/15 0616 09/15/15 0826  BP: (!) 161/80 (!) 147/64 123/71   Pulse: 82 66 68   Resp: 18  18 20   Temp: 98.2 F (36.8 C)  97.8  F (36.6 C) 97.7 F (36.5 C)  TempSrc: Oral  Oral Oral  SpO2: 100%  99% 98%  Weight:   58.2 kg (128 lb 4.8 oz)   Height:        Wt Readings from Last 3 Encounters:  09/15/15 58.2 kg (128 lb 4.8 oz)  09/09/15 60.8 kg (134 lb)  06/30/15 63 kg (139 lb)     Intake/Output Summary (Last 24 hours) at 09/15/15 1000 Last data filed at 09/15/15 0600  Gross per 24 hour  Intake             1080 ml  Output             2200 ml  Net            -1120 ml     Physical Exam  Awake Alert, Oriented X 3, No new F.N deficits, Normal affect [place].AT,PERRAL Supple Neck,No JVD, No cervical lymphadenopathy appriciated.  Symmetrical Chest wall movement, Good air movement bilaterally, CTAB RRR,No Gallops,Rubs or new Murmurs, No Parasternal Heave +ve B.Sounds, Abd Soft, No tenderness, No organomegaly appriciated, No rebound - guarding or rigidity. No Cyanosis, Clubbing or edema, No new Rash or bruise       Data Review:    CBC  Recent Labs Lab 09/12/15 2122  WBC 7.6  HGB 13.5  HCT 40.6  PLT 198  MCV 96.4  MCH 32.1  MCHC 33.3  RDW 12.3    Chemistries   Recent Labs Lab 09/12/15 2122  NA 135  K 4.3  CL 98*  CO2 29  GLUCOSE 113*  BUN 12  CREATININE 0.95  CALCIUM 9.8  MG 1.9  AST 24  ALT 18  ALKPHOS 60  BILITOT 0.6   ------------------------------------------------------------------------------------------------------------------  Recent Labs  09/13/15 0320  CHOL 135  HDL 74  LDLCALC 52  TRIG 45  CHOLHDL 1.8    ------------------------------------------------------------------------------------------------------------------  Recent Labs  09/13/15 0320  TSH 3.067   ------------------------------------------------------------------------------------------------------------------ No results for input(s): VITAMINB12, FOLATE, FERRITIN, TIBC, IRON, RETICCTPCT in the last 72 hours.  Coagulation profile  Recent Labs Lab 09/12/15 2122  INR 1.11    No results for  input(s): DDIMER in the last 72 hours.  Cardiac Enzymes  Recent Labs Lab 09/13/15 0133 09/13/15 0320 09/13/15 0849  TROPONINI 0.05* 0.05* 0.03*   ------------------------------------------------------------------------------------------------------------------ No results found for: BNP  Micro Results No results found for this or any previous visit (from the past 240 hour(s)).  Radiology Reports Dg Chest 2 View  Result Date: 09/12/2015 CLINICAL DATA:  Acute onset of intermittent hypertension and hypotension. Irregular heartbeat. Central chest pressure. Initial encounter. EXAM: CHEST  2 VIEW COMPARISON:  Chest radiograph performed 02/26/2014 FINDINGS: The lungs are hyperexpanded, with flattening of the hemidiaphragms, compatible with COPD. There is no evidence of focal opacification, pleural effusion or pneumothorax. The heart is normal in size; the patient is status post median sternotomy. No acute osseous abnormalities are seen. IMPRESSION: Findings of COPD.  Lungs remain otherwise clear. Electronically Signed   By: Garald Balding M.D.   On: 09/12/2015 22:12   Dg Abd Portable 1v  Result Date: 09/14/2015 CLINICAL DATA:  Abdominal pain EXAM: PORTABLE ABDOMEN - 1 VIEW COMPARISON:  06/16/2015 FINDINGS: Nonobstructive bowel gas pattern. No free air organomegaly. Diffuse vascular calcifications. No acute bony abnormality. Visualized lung bases clear. IMPRESSION: No acute findings. Electronically Signed   By: Rolm Baptise M.D.   On: 09/14/2015 08:50    Time Spent in minutes  30   Rhyse Skowron K M.D on 09/15/2015 at 10:00 AM  Between 7am to 7pm - Pager - [phone]  After 7pm go to [email or web address] - password [facility]  [facility] -  Office  [phone]

## 2015-09-15 NOTE — Progress Notes (Signed)
Subjective:  Still has orthostatic drop this morning but standing blood pressure was 104 this morning.  Still quite anxious about his overall medical condition and situation.  No recurrent chest pain.  No shortness of breath.  Objective:  Vital Signs in the last 24 hours: BP 123/71 (BP Location: Right Arm)   Pulse 68   Temp 97.7 F (36.5 C) (Oral)   Resp 20   Ht 5\' 7"  (1.702 m)   Wt 58.2 kg (128 lb 4.8 oz) Comment: scale b  SpO2 98%   BMI 20.09 kg/m   Physical Exam: Orthostatic VS for the past 24 hrs:  BP- Lying Pulse- Lying BP- Sitting Pulse- Sitting BP- Standing at 0 minutes Pulse- Standing at 0 minutes  09/15/15 0826 129/62 74 116/58 75 104/55 74  09/14/15 1627 - - - - 121/61 83  09/14/15 1626 - - 121/67 68 - -  09/14/15 1624 132/68 66 - - - -  09/14/15 1240 124/57 58 110/68 68 (!) 83/48 69   Pleasant male in no acute distress sitting up at bedside wearing oxygen Lungs:  Clear Cardiac:  Regular rhythm, normal S1 and S2, no S3 Extremities:  No edema present  Intake/Output from previous day: 09/05 0701 - 09/06 0700 In: 1320 [P.O.:1320] Out: 2200 [Urine:2200]  Weight Filed Weights   09/13/15 0156 09/14/15 0533 09/15/15 0616  Weight: 59.1 kg (130 lb 3.2 oz) 57.3 kg (126 lb 6.4 oz) 58.2 kg (128 lb 4.8 oz)    Lab Results: Basic Metabolic Panel:  Recent Labs  09/12/15 2122  NA 135  K 4.3  CL 98*  CO2 29  GLUCOSE 113*  BUN 12  CREATININE 0.95   CBC:  Recent Labs  09/12/15 2122  WBC 7.6  HGB 13.5  HCT 40.6  MCV 96.4  PLT 198   Cardiac Enzymes: Troponin (Point of Care Test)  Recent Labs  09/12/15 2121  TROPIPOC 0.02   Cardiac Panel (last 3 results)  Recent Labs  09/13/15 0133 09/13/15 0320 09/13/15 0849  TROPONINI 0.05* 0.05* 0.03*    Telemetry: Sinus with right bundle branch block and occasional aberrant beats  Assessment/Plan:  1.  Severe COPD with some element of cor pulmonale 2.  Significant orthostasis-I would recommend that his  blood pressure medicines be dosed based on his standing blood pressures.  He also would benefit from wearing thigh-high support hose -he currently only has knee-high support hose on.  Recommendations:  1.  Important when he goes home that he drank about 16 ounces of fluid in the morning first thing 2.  Sleep with head of bed elevated at night 3.  Thigh-high moderate pressure support stockings to put on first thing in the morning and take when off when he goes to bed 4.  Florinef to help maintain his standing blood pressure greater than [id number] systolic 5.  Avoid additional antihypertensive medicines based on a supine blood pressure but instead use blood pressure medicines based on his standing blood pressure.  If he has significant supine hypertension we can address this later.      Kerry Hough  MD [facility]  09/15/2015, 8:50 AM  .

## 2015-09-16 ENCOUNTER — Other Ambulatory Visit: Payer: Self-pay | Admitting: Family Medicine

## 2015-09-16 DIAGNOSIS — R109 Unspecified abdominal pain: Secondary | ICD-10-CM

## 2015-09-16 MED ORDER — THIAMINE HCL 100 MG PO TABS: 100.0000 mg | ORAL_TABLET | Freq: Every day | ORAL | 0 refills | Status: DC

## 2015-09-16 MED ORDER — HYDRALAZINE HCL 25 MG PO TABS
25.0000 mg | ORAL_TABLET | Freq: Two times a day (BID) | ORAL | Status: DC
  Filled 2015-09-16: qty 1

## 2015-09-16 MED ORDER — FLUDROCORTISONE ACETATE 0.1 MG PO TABS: 0.1000 mg | ORAL_TABLET | Freq: Two times a day (BID) | ORAL | 0 refills | Status: DC

## 2015-09-16 MED ORDER — PREDNISONE 20 MG PO TABS: 20.0000 mg | ORAL_TABLET | Freq: Every day | ORAL | 0 refills | Status: DC

## 2015-09-16 MED ORDER — FOLIC ACID 1 MG PO TABS: 1.0000 mg | ORAL_TABLET | Freq: Every day | ORAL | 0 refills | Status: DC

## 2015-09-16 MED ORDER — HYDRALAZINE HCL 25 MG PO TABS: 25.0000 mg | ORAL_TABLET | Freq: Two times a day (BID) | ORAL | 0 refills | Status: DC

## 2015-09-16 NOTE — Care Management Note (Signed)
Case Management Note  Patient Details  Name: [name] MRN: [id number] Date of Birth: [birth year]  Subjective/Objective:     Chest pain, HTN, COPD               Action/Plan: Discharge Planning: AVS reviewed:  NCM spoke to pt and wife, [name] at bedside. Pt states he did bring portable oxygen for dc. Pt has home oxygen. States he was ambulatory with no DME needed at home. Wife at home to assist with his care. Can afford his medications. PT did not recommend [facility] PT.   PCP-Travis Ross MD  Expected Discharge Date:  09/16/2015            Expected Discharge Plan:  Home/Self Care  In-House Referral:  NA  Discharge planning Services  CM Consult  Post Acute Care Choice:  NA Choice offered to:  NA  DME Arranged:  N/A DME Agency:  NA  HH Arranged:  NA HH Agency:  NA  Status of Service:  Completed, signed off  If discussed at [place] of Stay Meetings, dates discussed:    Additional Comments:  Erenest Rasher, RN 09/16/2015, 11:07 AM

## 2015-09-16 NOTE — Progress Notes (Signed)
Pt has orders to be discharged. Discharge instructions given and pt has no additional questions at this time. Medication regimen reviewed and pt educated. Pt verbalized understanding and has no additional questions. Telemetry box removed. IV removed and site in good condition. Pt stable and waiting for transportation.  Allah Reason RN 

## 2015-09-16 NOTE — Discharge Summary (Signed)
[name] [id number] DOB: 05-17-1929 DOA: 09/12/2015  PCP:  Travis Crutch, MD  Admit date: 09/12/2015  Discharge date: 09/16/2015  Admitted From: Home   Disposition:  Home   Recommendations for Outpatient Follow-up:   Follow up with PCP in 1-2 weeks  PCP Please obtain BMP/CBC, 2 view CXR in 1week,  (see Discharge instructions)   PCP Please follow up on the following pending results: none   Home Health: None   Equipment/Devices: None  Consultations: Cards Discharge Condition: Stable   CODE STATUS: Full   Diet Recommendation: Heart Healthy   Chief Complaint  Patient presents with  . Irregular Heart Beat     Brief history of present illness from the day of admission and additional interim summary    [name] a 82 y.o.malewith medical history significant of CAD with remote h/o CABG, O2-dependent COPD, HTN, and HLD presenting with BP issues and chest tightness. He was seen by cardiology, chest pain was unremarkable and atypical, no further workup per cardiology, he was actually found to have severe orthostatic hypotension due to most likely prednisone induced adrenal insufficiency, he was started on higher than home dose prednisone along with Florinef, IV fluids and initially IV steroids with resolution of his orthostatic blood pressure.    Hospital issues addressed     1.Atypical chest pain likely due to uncontrolled hypertension. Chest pain resolved, ruled out for MI, troponin flat trend non-ACS pattern and borderline, EKG nonacute, seen by cardiology, blood pressure medications adjusted,  no further workup per cardiology.  2. Orthostatic hypotension with at times high supine blood pressures. He is chronically on prednisone 5 mg daily for COPD which I think had caused adrenal insufficiency, With  chronic prednisone use no point in checking cosyntropin stim test per Dr. Loanne [name] which I concur with. He was hydrated with IV fluids, placed on IV hydrocortisone along with oral Florinef with complete resolution of orthostatic blood pressure, for his supine blood pressures he will continue his beta blocker and we have added low-dose hydralazine. He is also been given thigh-high TED stockings and requested to wear it when he is out of the bed at all times, note patient and wife are severely noncompliant and frequently refuse medications and instructions.  3. Dyslipidemia. Continue home dose statin.  4. COPD with chronic hypoxic respiratory failure. Stable continue supportive care with home oxygen and nebulizer treatments as needed.  5. History of BPH and ureteral tremor. Follow with primary urologist outpatient as before.  6. GERD. Continue home regimen. Follow with GI outpatient if symptoms continue.  7. History of anxiety. Continue home dose Xanax.  8. HX alcohol abuse quit several months ago. Counseled to continue to abstain. Have placed him on folic acid and thiamine.     Discharge diagnosis     Principal Problem:   Chest pain Active Problems:   Hyperlipidemia   Essential hypertension   Chronic respiratory failure with hypoxia and hypercapnia (HCC)   CAD (coronary artery disease)   BPH (benign prostatic hyperplasia)  GERD (gastroesophageal reflux disease)   Anxiety    Discharge instructions    Discharge Instructions    Diet - low sodium heart healthy    Complete by:  As directed   Discharge instructions    Complete by:  As directed   Follow with Primary MD  Travis Crutch, MD in 3 days   Get CBC, CMP, 2 view Chest X ray checked  by Primary MD or SNF MD in 5-7 days ( we routinely change or add medications that can affect your baseline labs and fluid status, therefore we recommend that you get the mentioned basic workup next visit with your PCP, your PCP may decide not  to get them or add new tests based on their clinical decision)   Activity: As tolerated with Full fall precautions use walker/cane & assistance as needed   Disposition Home     Diet:   Heart Healthy     On your next visit with your primary care physician please Get Medicines reviewed and adjusted.   Please request your Prim.MD to go over all Hospital Tests and Procedure/Radiological results at the follow up, please get all Hospital records sent to your Prim MD by signing hospital release before you go home.   If you experience worsening of your admission symptoms, develop shortness of breath, life threatening emergency, suicidal or homicidal thoughts you must seek medical attention immediately by calling 911 or calling your MD immediately  if symptoms less severe.  You Must read complete instructions/literature along with all the possible adverse reactions/side effects for all the Medicines you take and that have been prescribed to you. Take any new Medicines after you have completely understood and accpet all the possible adverse reactions/side effects.   Do not drive, operate heavy machinery, perform activities at heights, swimming or participation in water activities or provide baby sitting services if your were admitted for syncope or siezures until you have seen by Primary MD or a Neurologist and advised to do so again.  Do not drive when taking Pain medications.    Do not take more than prescribed Pain, Sleep and Anxiety Medications  Special Instructions: If you have smoked or chewed Tobacco  in the last 2 yrs please stop smoking, stop any regular Alcohol  and or any Recreational drug use.  Wear Seat belts while driving.   Please note  You were cared for by a hospitalist during your hospital stay. If you have any questions about your discharge medications or the care you received while you were in the hospital after you are discharged, you can call the unit and asked to  speak with the hospitalist on call if the hospitalist that took care of you is not available. Once you are discharged, your primary care physician will handle any further medical issues. Please note that NO REFILLS for any discharge medications will be authorized once you are discharged, as it is imperative that you return to your primary care physician (or establish a relationship with a primary care physician if you do not have one) for your aftercare needs so that they can reassess your need for medications and monitor your lab values.   Increase activity slowly    Complete by:  As directed      Discharge Medications     Medication List    TAKE these medications   ALPRAZolam 0.25 MG tablet Commonly known as:  XANAX TAKE 1 TABLET BY MOUTH EVERY 8 HOURS AS NEEDED FOR ANXIETY What changed:  See the new instructions.   aspirin 81 MG tablet Take 81 mg by mouth daily.   atorvastatin 20 MG tablet Commonly known as:  LIPITOR TAKE 1 TABLET BY MOUTH EVERY DAY What changed:  See the new instructions.   budesonide 0.5 MG/2ML nebulizer solution Commonly known as:  PULMICORT Take 2 mLs (0.5 mg total) by nebulization 2 (two) times daily.   finasteride 5 MG tablet Commonly known as:  PROSCAR Take 5 mg by mouth every morning.   fludrocortisone 0.1 MG tablet Commonly known as:  FLORINEF Take 1 tablet (0.1 mg total) by mouth 2 (two) times daily.   fluticasone 50 MCG/ACT nasal spray Commonly known as:  FLONASE Place 2 sprays into both nostrils daily.   folic acid 1 MG tablet Commonly known as:  FOLVITE Take 1 tablet (1 mg total) by mouth daily.   furosemide 20 MG tablet Commonly known as:  LASIX Take 1 tablet (20 mg total) by mouth daily as needed for fluid. What changed:  when to take this  reasons to take this   hydrALAZINE 25 MG tablet Commonly known as:  APRESOLINE Take 1 tablet (25 mg total) by mouth 2 (two) times daily.   loratadine 10 MG tablet Commonly known as:   CLARITIN Take 10 mg by mouth daily as needed (drippy nose and drainage). Reported on 05/24/2015   multivitamin tablet Take 1 tablet by mouth daily.   NITROSTAT 0.4 MG SL tablet Generic drug:  nitroGLYCERIN DISSOLVE 1 TABLET UNDER THE TONGUE AS DIRECTED AND AS NEEDED FOR CHEST PAIN   omeprazole 40 MG capsule Commonly known as:  PRILOSEC TAKE 1 CAPSULE(40 MG) BY MOUTH TWICE DAILY BEFORE A MEAL   OXYGEN 2 l/m at bedtime and as needed for shortness of breath   predniSONE 20 MG tablet Commonly known as:  DELTASONE Take 1 tablet (20 mg total) by mouth daily with breakfast. What changed:  See the new instructions.   SPIRIVA RESPIMAT 2.5 MCG/ACT Aers Generic drug:  Tiotropium Bromide Monohydrate INHALE 2 PUFFS BY MOUTH EVERY DAY What changed:  See the new instructions.   SYMBICORT 160-4.5 MCG/ACT inhaler Generic drug:  budesonide-formoterol INHALE 2 PUFFS BY MOUTH EVERY MORNING AND THEN ANOTHER 2 PUFFS ABOUT 12 HOURS LATER   thiamine 100 MG tablet Take 1 tablet (100 mg total) by mouth daily.   TOPROL XL 25 MG 24 hr tablet Generic drug:  metoprolol succinate TAKE 1 TABLET(25 MG) BY MOUTH DAILY   Vitamin D3 1000 units Caps Take 1 capsule (1,000 Units total) by mouth daily. What changed:  how much to take  when to take this   XOPENEX HFA 45 MCG/ACT inhaler Generic drug:  levalbuterol INHALE 2 PUFFS BY MOUTH EVERY 4 HOURS AS NEEDED FOR WHEEZING OR SHORTNESS OF BREATH       Follow-up Information     Travis Crutch, MD. Schedule an appointment as soon as possible for a visit in 3 day(s).   Specialty:  Family Medicine Contact information: [place] [address] [phone]        Renato Shin, MD. Schedule an appointment as soon as possible for a visit in 2 day(s).   Specialty:  Endocrinology Why:  adrenal insufficiency Contact information: [address] [place] [address] [phone]           Major procedures and  Radiology Reports - PLEASE review detailed and final reports thoroughly  -        Dg Chest 2  View  Result Date: 09/12/2015 CLINICAL DATA:  Acute onset of intermittent hypertension and hypotension. Irregular heartbeat. Central chest pressure. Initial encounter. EXAM: CHEST  2 VIEW COMPARISON:  Chest radiograph performed 02/26/2014 FINDINGS: The lungs are hyperexpanded, with flattening of the hemidiaphragms, compatible with COPD. There is no evidence of focal opacification, pleural effusion or pneumothorax. The heart is normal in size; the patient is status post median sternotomy. No acute osseous abnormalities are seen. IMPRESSION: Findings of COPD.  Lungs remain otherwise clear. Electronically Signed   By: Garald Balding M.D.   On: 09/12/2015 22:12   Dg Abd Portable 1v  Result Date: 09/14/2015 CLINICAL DATA:  Abdominal pain EXAM: PORTABLE ABDOMEN - 1 VIEW COMPARISON:  06/16/2015 FINDINGS: Nonobstructive bowel gas pattern. No free air organomegaly. Diffuse vascular calcifications. No acute bony abnormality. Visualized lung bases clear. IMPRESSION: No acute findings. Electronically Signed   By: Rolm Baptise M.D.   On: 09/14/2015 08:50    Micro Results    No results found for this or any previous visit (from the past 240 hour(s)).  Today   Subjective    [name] today has no headache,no chest abdominal pain,no new weakness tingling or numbness, feels much better wants to go home today.     Objective   Blood pressure 137/73, pulse 67, temperature 98.3 F (36.8 C), temperature source Oral, resp. rate 18, height 5\' 7"  (1.702 m), weight 60.4 kg (133 lb 1.6 oz), SpO2 100 %.   Intake/Output Summary (Last 24 hours) at 09/16/15 0844 Last data filed at 09/16/15 0500  Gross per 24 hour  Intake             1460 ml  Output             1601 ml  Net             -141 ml    Exam Awake Alert, Oriented x 3, No new F.N deficits, Normal affect [place].AT,PERRAL Supple Neck,No JVD, No cervical  lymphadenopathy appriciated.  Symmetrical Chest wall movement, Good air movement bilaterally, CTAB RRR,No Gallops,Rubs or new Murmurs, No Parasternal Heave +ve B.Sounds, Abd Soft, Non tender, No organomegaly appriciated, No rebound -guarding or rigidity. No Cyanosis, Clubbing or edema, No new Rash or bruise   Data Review   CBC w Diff: Lab Results  Component Value Date   WBC 7.6 09/12/2015   HGB 13.5 09/12/2015   HCT 40.6 09/12/2015   PLT 198 09/12/2015   LYMPHOPCT 13.5 04/15/2015   MONOPCT 12.0 04/15/2015   EOSPCT 1.4 04/15/2015   BASOPCT 0.4 04/15/2015    CMP: Lab Results  Component Value Date   NA 135 09/12/2015   K 4.3 09/12/2015   CL 98 (L) 09/12/2015   CO2 29 09/12/2015   BUN 12 09/12/2015   CREATININE 0.95 09/12/2015   PROT 6.4 (L) 09/12/2015   ALBUMIN 4.3 09/12/2015   BILITOT 0.6 09/12/2015   ALKPHOS 60 09/12/2015   AST 24 09/12/2015   ALT 18 09/12/2015  .   Total Time in preparing paper work, data evaluation and todays exam - 35 minutes  Thurnell Lose M.D on 09/16/2015 at 8:44 AM  [facility]   Office  [phone]

## 2015-09-16 NOTE — Evaluation (Signed)
Physical Therapy Evaluation Patient Details Name: [name] MRN: [id number] DOB: 1929-02-08 Today's Date: 09/16/2015   History of Present Illness  82 y.o. male with medical history significant of CAD with remote h/o CABG, O2-dependent COPD, HTN, and HLD presenting with BP issues and chest tightness.    Clinical Impression  Pt demo mild deconditioning. He is mod I with bed mobility and transfers. Supervision provided for ambulation to assist with IV pole/port O2 management. No physical assist needed and no LOB noted during ambulation 150 feet without A.D. O2 sats at 88% after ambulation but returned to 96% following < 30 second seated rest break. Pt educated on energy conservation techniques and need for rest breaks when SOB. No f/u PT services indicated. PT signing off.    Follow Up Recommendations No PT follow up;Supervision - Intermittent    Equipment Recommendations  None recommended by PT    Recommendations for Other Services       Precautions / Restrictions Precautions Precautions: Other (comment) Precaution Comments: O2 Restrictions Weight Bearing Restrictions: No      Mobility  Bed Mobility Overal bed mobility: Modified Independent                Transfers Overall transfer level: Modified independent Equipment used: None                Ambulation/Gait Ambulation/Gait assistance: Supervision Ambulation Distance (Feet): 150 Feet Assistive device: None Gait Pattern/deviations: Step-through pattern;Decreased stride length Gait velocity: decreased Gait velocity interpretation: Below normal speed for age/gender General Gait Details: 2 L O2 during ambulation  Stairs            Wheelchair Mobility    Modified Rankin (Stroke Patients Only)       Balance Overall balance assessment: No apparent balance deficits (not formally assessed)                                           Pertinent Vitals/Pain Pain Assessment: No/denies  pain    Home Living Family/patient expects to be discharged to:: Private residence Living Arrangements: Spouse/significant other Available Help at Discharge: Family;Available 24 hours/day Type of Home: House Home Access: Stairs to enter Entrance Stairs-Rails: Psychiatric nurse of Steps: 4 Home Layout: Multi-level Home Equipment: Other (comment) (home O2)      Prior Function Level of Independence: Independent         Comments: Pt is on 2 L continuous O2 at home.     Hand Dominance        Extremity/Trunk Assessment   Upper Extremity Assessment: Generalized weakness           Lower Extremity Assessment: Generalized weakness      Cervical / Trunk Assessment: Kyphotic  Communication   Communication: No difficulties  Cognition Arousal/Alertness: Awake/alert Behavior During Therapy: WFL for tasks assessed/performed Overall Cognitive Status: Within Functional Limits for tasks assessed                      General Comments      Exercises        Assessment/Plan    PT Assessment Patent does not need any further PT services  PT Diagnosis Generalized weakness   PT Problem List    PT Treatment Interventions     PT Goals (Current goals can be found in the Care Plan section) Acute Rehab PT Goals Patient Stated  Goal: return to walking on the treadmill PT Goal Formulation: All assessment and education complete, DC therapy    Frequency     Barriers to discharge        Co-evaluation               End of Session Equipment Utilized During Treatment: Gait belt;Oxygen Activity Tolerance: Patient tolerated treatment well Patient left: in bed;with family/visitor present;with call bell/phone within reach Nurse Communication: Mobility status         Time: [id number] PT Time Calculation (min) (ACUTE ONLY): 24 min   Charges:   PT Evaluation $PT Eval Moderate Complexity: 1 Procedure PT Treatments $Gait Training: 8-22 mins    PT G Codes:        Lorriane Shire 09/16/2015, 10:46 AM

## 2015-09-16 NOTE — Discharge Instructions (Signed)
Follow with Primary MD  Melinda Crutch, MD in 3 days   Get CBC, CMP, 2 view Chest X ray checked  by Primary MD or SNF MD in 5-7 days ( we routinely change or add medications that can affect your baseline labs and fluid status, therefore we recommend that you get the mentioned basic workup next visit with your PCP, your PCP may decide not to get them or add new tests based on their clinical decision)   Activity: As tolerated with Full fall precautions use walker/cane & assistance as needed   Disposition Home     Diet:   Heart Healthy     On your next visit with your primary care physician please Get Medicines reviewed and adjusted.   Please request your Prim.MD to go over all Hospital Tests and Procedure/Radiological results at the follow up, please get all Hospital records sent to your Prim MD by signing hospital release before you go home.   If you experience worsening of your admission symptoms, develop shortness of breath, life threatening emergency, suicidal or homicidal thoughts you must seek medical attention immediately by calling 911 or calling your MD immediately  if symptoms less severe.  You Must read complete instructions/literature along with all the possible adverse reactions/side effects for all the Medicines you take and that have been prescribed to you. Take any new Medicines after you have completely understood and accpet all the possible adverse reactions/side effects.   Do not drive, operate heavy machinery, perform activities at heights, swimming or participation in water activities or provide baby sitting services if your were admitted for syncope or siezures until you have seen by Primary MD or a Neurologist and advised to do so again.  Do not drive when taking Pain medications.    Do not take more than prescribed Pain, Sleep and Anxiety Medications  Special Instructions: If you have smoked or chewed Tobacco  in the last 2 yrs please stop smoking, stop any regular  Alcohol  and or any Recreational drug use.  Wear Seat belts while driving.   Please note  You were cared for by a hospitalist during your hospital stay. If you have any questions about your discharge medications or the care you received while you were in the hospital after you are discharged, you can call the unit and asked to speak with the hospitalist on call if the hospitalist that took care of you is not available. Once you are discharged, your primary care physician will handle any further medical issues. Please note that NO REFILLS for any discharge medications will be authorized once you are discharged, as it is imperative that you return to your primary care physician (or establish a relationship with a primary care physician if you do not have one) for your aftercare needs so that they can reassess your need for medications and monitor your lab values.

## 2015-09-17 DIAGNOSIS — R609 Edema, unspecified: Secondary | ICD-10-CM | POA: Diagnosis not present

## 2015-09-17 DIAGNOSIS — J449 Chronic obstructive pulmonary disease, unspecified: Secondary | ICD-10-CM | POA: Diagnosis not present

## 2015-09-17 DIAGNOSIS — I10 Essential (primary) hypertension: Secondary | ICD-10-CM | POA: Diagnosis not present

## 2015-09-17 DIAGNOSIS — I951 Orthostatic hypotension: Secondary | ICD-10-CM | POA: Diagnosis not present

## 2015-09-21 ENCOUNTER — Telehealth: Payer: Self-pay | Admitting: Interventional Cardiology

## 2015-09-21 NOTE — Telephone Encounter (Signed)
Pt was in the hospital for BP issues. His BP   fluctuate up and down. Pt was D/C on 09/16/15. BP on D/C was 137/73. This morning's pt's BP prior taking his BP medicine was 182/73. An hour after taking Metoprolol Succinate 25 mg and Hydralazine 25 mg BP was 137/73.    Pt has an appointment with Dr. Tamala Julian on 9/19 pt would like to be seen sooner . An appointment was made with [name] NP on 09/22/15 at 11:30 AM. Pt is aware.

## 2015-09-21 NOTE — Telephone Encounter (Signed)
New Message:     Pt's blood pressure is up much,much higher than usual.Pt is very concerned,please call to advise.

## 2015-09-22 ENCOUNTER — Ambulatory Visit (INDEPENDENT_AMBULATORY_CARE_PROVIDER_SITE_OTHER): Payer: Medicare Other | Admitting: Nurse Practitioner

## 2015-09-22 ENCOUNTER — Encounter: Payer: Self-pay | Admitting: Nurse Practitioner

## 2015-09-22 VITALS — BP 170/81 | HR 87 | Ht 68.0 in | Wt 137.1 lb

## 2015-09-22 DIAGNOSIS — I10 Essential (primary) hypertension: Secondary | ICD-10-CM | POA: Diagnosis not present

## 2015-09-22 DIAGNOSIS — I779 Disorder of arteries and arterioles, unspecified: Secondary | ICD-10-CM

## 2015-09-22 MED ORDER — METOPROLOL SUCCINATE ER 25 MG PO TB24: ORAL_TABLET | ORAL | 10 refills | Status: DC

## 2015-09-22 NOTE — Progress Notes (Signed)
CARDIOLOGY OFFICE NOTE  Date:  09/22/2015    [name] Date of Birth: 1929/05/15 Medical Record [id number]  PCP:   Melinda Crutch, MD  Cardiologist:  Tamala Julian    Chief Complaint  Patient presents with  . Follow-up    Post hospital visit - seen for Dr. Tamala Julian    History of Present Illness: [name] is a 82 y.o. male who presents today for a work in visit. Seen for Dr. Tamala Julian.  He has a history of CAD with prior coronary bypass grafting x 5 in 2003 per [facility], COPD with cor pulmonale, hyperlipidemia, hypertension, PAD with carotid disease (followed by [facility]), alcohol abuse and history of carotid obstructive disease.  He was last seen back in March - felt to be doing ok. Was on chronic O2 therapy.   Phone call earlier this week - "Pt was in the hospital for BP issues. His BP   fluctuate up and down. Pt was D/C on 09/16/15. BP on D/C was 137/73. This morning's pt's BP prior taking his BP medicine was 182/73. An hour after taking Metoprolol Succinate 25 mg and Hydralazine 25 mg BP was 137/73.    Pt has an appointment with Dr. Tamala Julian on 9/19 pt would like to be seen sooner . An appointment was made with [name] NP on 09/22/15 at 11:30 AM. Pt is aware."  Noted that he was admitted earlier this month with chest tightness and BP issues. Chest pain was felt to be atypical and initially an outpatient Myovew was recommended but then this was felt that it would not give much value and so no further work up was recommended by cardiology (Dr. Wynonia Lawman). He was noted to have severe orthostatic hypotension felt to be due to prednisone induced adrenal insufficiency and was treated with steroids, Florinef and IVF. The record was noted that the patient and his wife are "severely noncompliant and frequently refuse medications and instructions".   Thus added to my schedule.   Comes in today. Here with his wife. Very complex situation. He is on oxygen.[name] Has been home for almost a week. He has a multitude of  complaints. He has lost weight. Still with chest tightness. This seems to come and go. Still thinks he does not need a stress test.  His legs are swelling. He remains short of breath with activity. He feels like he is depressed. Dizzy. Very tired. Thinks the Hydralazine makes him confused and has affected his urinary flow/frequency. He is not taking the Florinef. Notes that his BP has been up to 180 and 190 since he has been home. BP readings from today are ok - but he took 12 readings today so far. No other readings brought in but woke up with 174/92 yesterday morning. Notes he still feels fatigued and fuzzy. Feels "like crap" - seems this is a chronic issue - especially over the past 3 months - feels "frightful" especially in the mornings. He understands he is old but would like to get back to how he was feeling a few months ago. Has only been on steroid therapy for about 8 months. Dr. Harrington Challenger has cut his dose of Prednisone back to 10 mg and he has an appointment to see Dr. Dwyane Dee later this month. To see Dr. Tamala Julian next week.   Past Medical History:  Diagnosis Date  . Asthma   . BPH (benign prostatic hyperplasia)    severe; s/p multiple biopsies  . CAD (coronary artery disease)   .  Carotid artery occlusion   . Chronic rhinitis   . COPD (chronic obstructive pulmonary disease) (HCC)    2L [place] O2  . Hyperlipidemia   . Hypertension   . Peripheral vascular disease ([place])   . 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 Syncope and collapse   . Ureteral tumor 08/2015   had endoscopic procedure for evaluation, unable to reach for biopsy    Past Surgical History:  Procedure Laterality Date  . CATARACT EXTRACTION  11-20-11  . CORONARY ARTERY BYPASS GRAFT  2003  . LITHOTRIPSY    . PROSTATE BIOPSY  Oct. 2014     Medications: Current Outpatient Prescriptions  Medication Sig Dispense Refill  . ALPRAZolam (XANAX) 0.25 MG tablet Take 0.25 mg by mouth 2 (two) times daily as needed for anxiety.    [name] aspirin 81 MG tablet  Take 81 mg by mouth daily.    [name] atorvastatin (LIPITOR) 20 MG tablet Take 20 mg by mouth daily at 6 PM.    . Cholecalciferol (VITAMIN D3) 2000 units TABS Take 2,000 Units by mouth daily.    . finasteride (PROSCAR) 5 MG tablet Take 5 mg by mouth every morning.    . fluticasone (FLONASE) 50 MCG/ACT nasal spray Place 2 sprays into both nostrils daily.    . folic acid (FOLVITE) 1 MG tablet Take 1 tablet (1 mg total) by mouth daily. 30 tablet 0  . furosemide (LASIX) 20 MG tablet Take 20 mg by mouth as needed for edema.    . hydrALAZINE (APRESOLINE) 25 MG tablet Take 1 tablet (25 mg total) by mouth 2 (two) times daily. 60 tablet 0  . metoprolol succinate (TOPROL XL) 25 MG 24 hr tablet TAKE 1 TABLET(25 MG) BY MOUTH DAILY AT NIGHT 30 tablet 10  . Multiple Vitamin (MULTIVITAMIN) tablet Take 1 tablet by mouth daily.    [name] NITROSTAT 0.4 MG SL tablet DISSOLVE 1 TABLET UNDER THE TONGUE AS DIRECTED AND AS NEEDED FOR CHEST PAIN 25 tablet 5  . omeprazole (PRILOSEC) 40 MG capsule TAKE 1 CAPSULE(40 MG) BY MOUTH TWICE DAILY BEFORE A MEAL 60 capsule 5  . OXYGEN 2 l/m at bedtime and as needed for shortness of breath    . predniSONE (DELTASONE) 10 MG tablet Take 10 mg by mouth daily with breakfast.    . SYMBICORT 160-4.5 MCG/ACT inhaler INHALE 2 PUFFS BY MOUTH EVERY MORNING AND THEN ANOTHER 2 PUFFS ABOUT 12 HOURS LATER 10.2 g 5  . thiamine 100 MG tablet Take 1 tablet (100 mg total) by mouth daily. 30 tablet 0  . Tiotropium Bromide Monohydrate (SPIRIVA RESPIMAT) 2.5 MCG/ACT AERS Inhale 2 puffs into the lungs daily.    [name] HFA 45 MCG/ACT inhaler INHALE 2 PUFFS BY MOUTH EVERY 4 HOURS AS NEEDED FOR WHEEZING OR SHORTNESS OF BREATH 15 g 2  . budesonide (PULMICORT) 0.5 MG/2ML nebulizer solution Take 2 mLs (0.5 mg total) by nebulization 2 (two) times daily. (Patient not taking: Reported on 09/22/2015) 60 mL 3  . loratadine (CLARITIN) 10 MG tablet Take 10 mg by mouth daily as needed (drippy nose and drainage). Reported on  05/24/2015     No current facility-administered medications for this visit.     Allergies: Allergies  Allergen Reactions  . Sulfonamide Derivatives Swelling    REACTION: facial/tongue swelling  . Cefdinir Diarrhea    REACTION: diarrhea  . Ciprofloxacin Itching    Red itchy hands  . Nitrofurantoin Swelling    Swollen hands  . Levaquin [Levofloxacin] Itching  and Rash    Social History: The patient  reports that he quit smoking about 33 years ago. His smoking use included Cigarettes. He has a 66.00 pack-year smoking history. He has never used smokeless tobacco. He reports that he drinks alcohol. He reports that he does not use drugs.   Family History: The patient's family history includes Allergies in his brother; Breast cancer in his mother; Cancer in his mother; Heart attack in his father; Heart disease in his brother, brother, father, and mother; Hyperlipidemia in his brother; Hypertension in his brother and mother; Other in his mother; Peripheral vascular disease in his brother; Stroke in his brother.   Review of Systems: Please see the history of present illness.   Otherwise, the review of systems is positive for none.   All other systems are reviewed and negative.   Physical Exam: VS:  BP (!) 170/81 (BP Location: Left Arm, Cuff Size: Large)   Pulse 87   Ht 5\' 8"  (1.727 m)   Wt 137 lb 1.9 oz (62.2 kg)   SpO2 99% Comment: on 2 liters of 02  BMI 20.85 kg/m  .  BMI Body mass index is 20.85 kg/m.  Wt Readings from Last 3 Encounters:  09/22/15 137 lb 1.9 oz (62.2 kg)  09/16/15 133 lb 1.6 oz (60.4 kg)  09/09/15 134 lb (60.8 kg)    Orthostatics checked - he feels "fuzzy" at rest Lying BP today is 170/81 with HR of 87 Sitting BP is 148/70 with HR of 83 Standing BP is 132/73 with HR of 91   General: Elderly male who appears chronically ill but alert and in no acute distress.  Seems frail. HEENT: Normal but very hard of hearing.  Neck: Supple, no JVD, carotid bruits, or  masses noted.  Cardiac: Regular rate and rhythm. No murmurs, rubs, or gallops. No edema.  Respiratory:  Lungs are with decreased breath sounds but with normal work of breathing.  GI: Soft and nontender.  MS: No deformity or atrophy. Gait not tested.  Skin: Warm and dry. Color is normal.  Neuro:  Strength and sensation are intact and no gross focal deficits noted.  Psych: Alert, appropriate and with normal affect.   LABORATORY DATA:  EKG:  EKG is not ordered today.  Lab Results  Component Value Date   WBC 7.6 09/12/2015   HGB 13.5 09/12/2015   HCT 40.6 09/12/2015   PLT 198 09/12/2015   GLUCOSE 113 (H) 09/12/2015   CHOL 135 09/13/2015   TRIG 45 09/13/2015   HDL 74 09/13/2015   LDLCALC 52 09/13/2015   ALT 18 09/12/2015   AST 24 09/12/2015   NA 135 09/12/2015   K 4.3 09/12/2015   CL 98 (L) 09/12/2015   CREATININE 0.95 09/12/2015   BUN 12 09/12/2015   CO2 29 09/12/2015   TSH 3.067 09/13/2015   INR 1.11 09/12/2015   HGBA1C (H) 02/23/2010    5.7 (NOTE)                                                                       According to the ADA Clinical Practice Recommendations for 2011, when HbA1c is used as a screening test:   >=6.5%   Diagnostic of Diabetes Mellitus           (  if abnormal result  is confirmed)  5.7-6.4%   Increased risk of developing Diabetes Mellitus  References:Diagnosis and Classification of Diabetes Mellitus,Diabetes [id number] 1):S62-S69 and Standards of Medical Care in         Diabetes - 2011,Diabetes [id number]  (Suppl 1):S11-S61.    BNP (last 3 results) No results for input(s): BNP in the last 8760 hours.  ProBNP (last 3 results)  Recent Labs  04/15/15 1118  PROBNP 71.0     Other Studies Reviewed Today:   Assessment/Plan: 1.Atypical chest pain likely due to uncontrolled hypertension. Chest pain has continued but he does not wish to have any further testing at this time. He already ruled out for MI, troponin flat trend non-ACS  pattern and borderline, EKG nonacute - will continue with the current management.   2. Orthostatic hypotension with at times high supine blood pressures. He is not taking the Florinef due to his high AM readings. He remains orthostatic - dropping 50 points but to an acceptable range. Explained that we will treat his standing BPs. Check renal duplex to make sure he does not have RAS. He has only been on Prednisone for 8 months. He is agreeable to staying on his current regimen for now - but may want to get off Hydralazine. Will change dosing time of his beta blocker to night time. He will check his BP only 3 times a day - sitting and standing.   3. Dyslipidemia. Continue home dose statin.  4. COPD with chronic hypoxic respiratory failure. Stable continue supportive care with home oxygen and nebulizer treatments as needed.  5. History of BPH and ureteral tremor. Sees urology - has even been to [facility]. No actual kidney tumors reported.   6. GERD. Continue home regimen.   7. History of anxiety. Continue home dose Xanax.  8. HX alcohol abuse quit several months ago. Not discussed today  Current medicines are reviewed with the patient today.  The patient does not have concerns regarding medicines other than what has been noted above.  The following changes have been made:  See above.  Labs/ tests ordered today include:   No orders of the defined types were placed in this encounter.    Disposition:   FU with Dr. Tamala Julian next week as planned. Will try to get his renal duplex prior to that visit.    Patient is agreeable to this plan and will call if any problems develop in the interim.   Signed: Burtis Junes, RN, ANP-C 09/22/2015 12:31 PM  [facility] [address] [place], [place]  [address] Phone: [phone] Fax: [phone]

## 2015-09-22 NOTE — Patient Instructions (Addendum)
We will be checking the following labs today - NONE   Medication Instructions:    Continue with your current medicines. BUT  Change the Toprol to nighttime with the night time dose of Hydralazine    Testing/Procedures To Be Arranged:  Renal duplex  Follow-Up:   See Dr. Tamala Julian as planned next week   Other Special Instructions:   Continue to wear your support stockings  Do not take your diuretic - Lasix  Check your BP only 3 times a day - sitting and standing and keep a record    If you need a refill on your cardiac medications before your next appointment, please call your pharmacy.   Call the [place] office at [phone] if you have any questions, problems or concerns.

## 2015-09-23 ENCOUNTER — Inpatient Hospital Stay (HOSPITAL_COMMUNITY): Admission: RE | Admit: 2015-09-23 | Payer: Medicare Other | Source: Ambulatory Visit

## 2015-09-24 ENCOUNTER — Inpatient Hospital Stay (HOSPITAL_COMMUNITY)
Admission: EM | Admit: 2015-09-24 | Discharge: 2015-09-26 | DRG: 303 | Disposition: A | Discharge status: Home / Self Care | Payer: Medicare Other | Attending: Interventional Cardiology | Admitting: Interventional Cardiology

## 2015-09-24 ENCOUNTER — Inpatient Hospital Stay (HOSPITAL_COMMUNITY): Payer: Medicare Other

## 2015-09-24 ENCOUNTER — Encounter (HOSPITAL_COMMUNITY): Payer: Self-pay

## 2015-09-24 ENCOUNTER — Emergency Department (HOSPITAL_COMMUNITY): Payer: Medicare Other

## 2015-09-24 ENCOUNTER — Telehealth: Payer: Self-pay | Admitting: Nurse Practitioner

## 2015-09-24 DIAGNOSIS — I2089 Other forms of angina pectoris: Secondary | ICD-10-CM | POA: Diagnosis present

## 2015-09-24 DIAGNOSIS — Z888 Allergy status to other drugs, medicaments and biological substances status: Secondary | ICD-10-CM

## 2015-09-24 DIAGNOSIS — I471 Supraventricular tachycardia: Secondary | ICD-10-CM

## 2015-09-24 DIAGNOSIS — I739 Peripheral vascular disease, unspecified: Secondary | ICD-10-CM | POA: Diagnosis present

## 2015-09-24 DIAGNOSIS — N400 Enlarged prostate without lower urinary tract symptoms: Secondary | ICD-10-CM | POA: Diagnosis present

## 2015-09-24 DIAGNOSIS — I25118 Atherosclerotic heart disease of native coronary artery with other forms of angina pectoris: Secondary | ICD-10-CM | POA: Diagnosis not present

## 2015-09-24 DIAGNOSIS — I4719 Other supraventricular tachycardia: Secondary | ICD-10-CM

## 2015-09-24 DIAGNOSIS — G4733 Obstructive sleep apnea (adult) (pediatric): Secondary | ICD-10-CM | POA: Diagnosis present

## 2015-09-24 DIAGNOSIS — I208 Other forms of angina pectoris: Secondary | ICD-10-CM | POA: Diagnosis present

## 2015-09-24 DIAGNOSIS — I4510 Unspecified right bundle-branch block: Secondary | ICD-10-CM | POA: Diagnosis not present

## 2015-09-24 DIAGNOSIS — I951 Orthostatic hypotension: Secondary | ICD-10-CM | POA: Diagnosis present

## 2015-09-24 DIAGNOSIS — Z87891 Personal history of nicotine dependence: Secondary | ICD-10-CM | POA: Diagnosis not present

## 2015-09-24 DIAGNOSIS — I10 Essential (primary) hypertension: Secondary | ICD-10-CM | POA: Diagnosis not present

## 2015-09-24 DIAGNOSIS — J449 Chronic obstructive pulmonary disease, unspecified: Secondary | ICD-10-CM | POA: Diagnosis present

## 2015-09-24 DIAGNOSIS — I25119 Atherosclerotic heart disease of native coronary artery with unspecified angina pectoris: Secondary | ICD-10-CM | POA: Diagnosis not present

## 2015-09-24 DIAGNOSIS — R000 Tachycardia, unspecified: Secondary | ICD-10-CM | POA: Diagnosis not present

## 2015-09-24 DIAGNOSIS — Z823 Family history of stroke: Secondary | ICD-10-CM | POA: Diagnosis not present

## 2015-09-24 DIAGNOSIS — R079 Chest pain, unspecified: Secondary | ICD-10-CM

## 2015-09-24 DIAGNOSIS — R109 Unspecified abdominal pain: Secondary | ICD-10-CM | POA: Diagnosis not present

## 2015-09-24 DIAGNOSIS — Z881 Allergy status to other antibiotic agents status: Secondary | ICD-10-CM | POA: Diagnosis not present

## 2015-09-24 DIAGNOSIS — Z882 Allergy status to sulfonamides status: Secondary | ICD-10-CM | POA: Diagnosis not present

## 2015-09-24 DIAGNOSIS — E785 Hyperlipidemia, unspecified: Secondary | ICD-10-CM | POA: Diagnosis present

## 2015-09-24 DIAGNOSIS — Z7952 Long term (current) use of systemic steroids: Secondary | ICD-10-CM

## 2015-09-24 DIAGNOSIS — Z9981 Dependence on supplemental oxygen: Secondary | ICD-10-CM | POA: Diagnosis not present

## 2015-09-24 DIAGNOSIS — Z951 Presence of aortocoronary bypass graft: Secondary | ICD-10-CM

## 2015-09-24 DIAGNOSIS — R0602 Shortness of breath: Secondary | ICD-10-CM | POA: Diagnosis not present

## 2015-09-24 DIAGNOSIS — E2740 Unspecified adrenocortical insufficiency: Secondary | ICD-10-CM | POA: Diagnosis not present

## 2015-09-24 DIAGNOSIS — J9611 Chronic respiratory failure with hypoxia: Secondary | ICD-10-CM | POA: Diagnosis present

## 2015-09-24 DIAGNOSIS — Z7951 Long term (current) use of inhaled steroids: Secondary | ICD-10-CM | POA: Diagnosis not present

## 2015-09-24 DIAGNOSIS — R0989 Other specified symptoms and signs involving the circulatory and respiratory systems: Secondary | ICD-10-CM

## 2015-09-24 DIAGNOSIS — Z7982 Long term (current) use of aspirin: Secondary | ICD-10-CM

## 2015-09-24 DIAGNOSIS — J9612 Chronic respiratory failure with hypercapnia: Secondary | ICD-10-CM | POA: Diagnosis present

## 2015-09-24 DIAGNOSIS — J310 Chronic rhinitis: Secondary | ICD-10-CM | POA: Diagnosis present

## 2015-09-24 DIAGNOSIS — E271 Primary adrenocortical insufficiency: Secondary | ICD-10-CM

## 2015-09-24 DIAGNOSIS — I25709 Atherosclerosis of coronary artery bypass graft(s), unspecified, with unspecified angina pectoris: Secondary | ICD-10-CM | POA: Diagnosis present

## 2015-09-24 DIAGNOSIS — F419 Anxiety disorder, unspecified: Secondary | ICD-10-CM | POA: Diagnosis present

## 2015-09-24 DIAGNOSIS — Z803 Family history of malignant neoplasm of breast: Secondary | ICD-10-CM

## 2015-09-24 DIAGNOSIS — Z8249 Family history of ischemic heart disease and other diseases of the circulatory system: Secondary | ICD-10-CM | POA: Diagnosis not present

## 2015-09-24 LAB — BASIC METABOLIC PANEL
Anion gap: 8 (ref 5–15)
BUN: 12 mg/dL (ref 6–20)
CO2: 28 mmol/L (ref 22–32)
Calcium: 9.9 mg/dL (ref 8.9–10.3)
Chloride: 102 mmol/L (ref 101–111)
Creatinine, Ser: 1.07 mg/dL (ref 0.61–1.24)
GFR calc Af Amer: >60 mL/min (ref >60)
GFR calc non Af Amer: >60 mL/min (ref >60)
Glucose, Bld: 150 mg/dL — ABNORMAL HIGH (ref 65–99)
Potassium: 4.4 mmol/L (ref 3.5–5.1)
Sodium: 138 mmol/L (ref 135–145)

## 2015-09-24 LAB — CBC
HCT: 43.8 % (ref 39.0–52.0)
HCT: 41.3 % (ref 39.0–52.0)
Hemoglobin: 13.9 g/dL (ref 13.0–17.0)
Hemoglobin: 13.4 g/dL (ref 13.0–17.0)
MCH: 31 pg (ref 26.0–34.0)
MCH: 31.3 pg (ref 26.0–34.0)
MCHC: 31.7 g/dL (ref 30.0–36.0)
MCHC: 32.4 g/dL (ref 30.0–36.0)
MCV: 97.8 fL (ref 78.0–100.0)
MCV: 96.5 fL (ref 78.0–100.0)
Platelets: 220 10*3/uL (ref 150–400)
Platelets: 205 10*3/uL (ref 150–400)
RBC: 4.48 MIL/uL (ref 4.22–5.81)
RBC: 4.28 MIL/uL (ref 4.22–5.81)
RDW: 12.5 % (ref 11.5–15.5)
RDW: 12.5 % (ref 11.5–15.5)
WBC: 9.3 10*3/uL (ref 4.0–10.5)
WBC: 8.7 10*3/uL (ref 4.0–10.5)

## 2015-09-24 LAB — CREATININE, SERUM
Creatinine, Ser: 1.05 mg/dL (ref 0.61–1.24)
GFR calc Af Amer: >60 mL/min (ref >60)
GFR calc non Af Amer: >60 mL/min (ref >60)

## 2015-09-24 LAB — MAGNESIUM: Magnesium: 2 mg/dL (ref 1.7–2.4)

## 2015-09-24 LAB — I-STAT TROPONIN, ED: Troponin i, poc: 0.01 ng/mL (ref 0.00–0.08)

## 2015-09-24 LAB — TROPONIN I: Troponin I: <0.03 ng/mL (ref <0.03)

## 2015-09-24 MED ORDER — SODIUM CHLORIDE 0.9% FLUSH: 3.0000 mL | INTRAVENOUS | Status: DC | PRN

## 2015-09-24 MED ORDER — ALPRAZOLAM 0.25 MG PO TABS
0.2500 mg | ORAL_TABLET | Freq: Once | ORAL | Status: AC
  Administered 2015-09-24: 0.25 mg via ORAL
  Filled 2015-09-24: qty 1

## 2015-09-24 MED ORDER — MOMETASONE FURO-FORMOTEROL FUM 200-5 MCG/ACT IN AERO
2.0000 | INHALATION_SPRAY | Freq: Two times a day (BID) | RESPIRATORY_TRACT | Status: DC
  Filled 2015-09-24: qty 8.8

## 2015-09-24 MED ORDER — FLUTICASONE PROPIONATE 50 MCG/ACT NA SUSP
2.0000 | Freq: Every day | NASAL | Status: DC
  Filled 2015-09-24: qty 16

## 2015-09-24 MED ORDER — SODIUM CHLORIDE 0.9% FLUSH: 3.0000 mL | Freq: Two times a day (BID) | INTRAVENOUS | Status: DC

## 2015-09-24 MED ORDER — SODIUM CHLORIDE 0.9 % IV SOLN: 250.0000 mL | INTRAVENOUS | Status: DC | PRN

## 2015-09-24 MED ORDER — LORATADINE 10 MG PO TABS: 10.0000 mg | ORAL_TABLET | Freq: Every day | ORAL | Status: DC | PRN

## 2015-09-24 MED ORDER — VITAMIN D 1000 UNITS PO TABS
2000.0000 [IU] | ORAL_TABLET | Freq: Every day | ORAL | Status: DC
  Administered 2015-09-25 – 2015-09-26 (×2): 2000 [IU] via ORAL
  Filled 2015-09-24 (×2): qty 2

## 2015-09-24 MED ORDER — NITROGLYCERIN 0.4 MG SL SUBL: 0.4000 mg | SUBLINGUAL_TABLET | SUBLINGUAL | Status: DC | PRN

## 2015-09-24 MED ORDER — METOPROLOL SUCCINATE ER 25 MG PO TB24
25.0000 mg | ORAL_TABLET | Freq: Every day | ORAL | Status: DC
  Administered 2015-09-24 – 2015-09-25 (×2): 25 mg via ORAL
  Filled 2015-09-24 (×2): qty 1

## 2015-09-24 MED ORDER — ASPIRIN 81 MG PO CHEW
324.0000 mg | CHEWABLE_TABLET | Freq: Once | ORAL | Status: AC
  Administered 2015-09-24: 324 mg via ORAL
  Filled 2015-09-24: qty 4

## 2015-09-24 MED ORDER — AMLODIPINE BESYLATE 5 MG PO TABS
5.0000 mg | ORAL_TABLET | Freq: Every day | ORAL | Status: DC
  Administered 2015-09-24 – 2015-09-26 (×3): 5 mg via ORAL
  Filled 2015-09-24 (×3): qty 1

## 2015-09-24 MED ORDER — ONE-DAILY MULTI VITAMINS PO TABS: 1.0000 | ORAL_TABLET | Freq: Every day | ORAL | Status: DC

## 2015-09-24 MED ORDER — FINASTERIDE 5 MG PO TABS
5.0000 mg | ORAL_TABLET | Freq: Every morning | ORAL | Status: DC
  Administered 2015-09-25 – 2015-09-26 (×2): 5 mg via ORAL
  Filled 2015-09-24 (×2): qty 1

## 2015-09-24 MED ORDER — PANTOPRAZOLE SODIUM 40 MG PO TBEC
40.0000 mg | DELAYED_RELEASE_TABLET | Freq: Every day | ORAL | Status: DC
  Administered 2015-09-24 – 2015-09-26 (×3): 40 mg via ORAL
  Filled 2015-09-24 (×3): qty 1

## 2015-09-24 MED ORDER — TIOTROPIUM BROMIDE MONOHYDRATE 18 MCG IN CAPS
18.0000 ug | ORAL_CAPSULE | Freq: Every day | RESPIRATORY_TRACT | Status: DC
  Filled 2015-09-24: qty 5

## 2015-09-24 MED ORDER — LEVALBUTEROL HCL 0.63 MG/3ML IN NEBU: 0.6300 mg | INHALATION_SOLUTION | Freq: Four times a day (QID) | RESPIRATORY_TRACT | Status: DC | PRN

## 2015-09-24 MED ORDER — HYDRALAZINE HCL 25 MG PO TABS: 25.0000 mg | ORAL_TABLET | Freq: Three times a day (TID) | ORAL | Status: DC | PRN

## 2015-09-24 MED ORDER — VITAMIN D3 50 MCG (2000 UT) PO TABS: 2000.0000 [IU] | ORAL_TABLET | Freq: Every day | ORAL | Status: DC

## 2015-09-24 MED ORDER — VITAMIN B-1 100 MG PO TABS
100.0000 mg | ORAL_TABLET | Freq: Every day | ORAL | Status: DC
  Administered 2015-09-25 – 2015-09-26 (×2): 100 mg via ORAL
  Filled 2015-09-24 (×2): qty 1

## 2015-09-24 MED ORDER — LEVALBUTEROL TARTRATE 45 MCG/ACT IN AERO: 2.0000 | INHALATION_SPRAY | Freq: Four times a day (QID) | RESPIRATORY_TRACT | Status: DC | PRN

## 2015-09-24 MED ORDER — ADULT MULTIVITAMIN W/MINERALS CH
1.0000 | ORAL_TABLET | Freq: Every day | ORAL | Status: DC
  Administered 2015-09-25 – 2015-09-26 (×2): 1 via ORAL
  Filled 2015-09-24 (×2): qty 1

## 2015-09-24 MED ORDER — TIOTROPIUM BROMIDE MONOHYDRATE 2.5 MCG/ACT IN AERS: 2.0000 | INHALATION_SPRAY | Freq: Every day | RESPIRATORY_TRACT | Status: DC

## 2015-09-24 MED ORDER — ASPIRIN 81 MG PO TABS: 81.0000 mg | ORAL_TABLET | Freq: Every day | ORAL | Status: DC

## 2015-09-24 MED ORDER — METOPROLOL TARTRATE 5 MG/5ML IV SOLN
5.0000 mg | Freq: Once | INTRAVENOUS | Status: AC
  Administered 2015-09-24: 5 mg via INTRAVENOUS
  Filled 2015-09-24: qty 5

## 2015-09-24 MED ORDER — ALPRAZOLAM 0.25 MG PO TABS
0.2500 mg | ORAL_TABLET | Freq: Two times a day (BID) | ORAL | Status: DC | PRN
  Administered 2015-09-24 – 2015-09-25 (×3): 0.25 mg via ORAL
  Filled 2015-09-24 (×3): qty 1

## 2015-09-24 MED ORDER — FOLIC ACID 1 MG PO TABS
1.0000 mg | ORAL_TABLET | Freq: Every day | ORAL | Status: DC
  Administered 2015-09-25 – 2015-09-26 (×2): 1 mg via ORAL
  Filled 2015-09-24 (×2): qty 1

## 2015-09-24 MED ORDER — SODIUM CHLORIDE 0.9% FLUSH
3.0000 mL | Freq: Two times a day (BID) | INTRAVENOUS | Status: DC
Start: 2015-09-24 — End: 2015-09-26
  Administered 2015-09-24: 10 mL via INTRAVENOUS
  Administered 2015-09-25 (×2): 3 mL via INTRAVENOUS

## 2015-09-24 MED ORDER — ATORVASTATIN CALCIUM 20 MG PO TABS
20.0000 mg | ORAL_TABLET | Freq: Every day | ORAL | Status: DC
  Administered 2015-09-24 – 2015-09-25 (×2): 20 mg via ORAL
  Filled 2015-09-24 (×2): qty 1

## 2015-09-24 MED ORDER — PREDNISONE 20 MG PO TABS
20.0000 mg | ORAL_TABLET | Freq: Every day | ORAL | Status: DC
  Administered 2015-09-25 – 2015-09-26 (×2): 20 mg via ORAL
  Filled 2015-09-24 (×2): qty 1

## 2015-09-24 MED ORDER — BUDESONIDE 0.5 MG/2ML IN SUSP: 0.5000 mg | Freq: Two times a day (BID) | RESPIRATORY_TRACT | Status: DC | PRN

## 2015-09-24 MED ORDER — ASPIRIN EC 81 MG PO TBEC
81.0000 mg | DELAYED_RELEASE_TABLET | Freq: Every day | ORAL | Status: DC
  Administered 2015-09-25 – 2015-09-26 (×2): 81 mg via ORAL
  Filled 2015-09-24 (×2): qty 1

## 2015-09-24 MED ORDER — THIAMINE HCL 100 MG PO TABS: 100.0000 mg | ORAL_TABLET | Freq: Every day | ORAL | Status: DC

## 2015-09-24 MED ORDER — HEPARIN SODIUM (PORCINE) 5000 UNIT/ML IJ SOLN
5000.0000 [IU] | Freq: Three times a day (TID) | INTRAMUSCULAR | Status: DC
  Filled 2015-09-24: qty 1

## 2015-09-24 NOTE — ED Notes (Signed)
Patient states that left sided chest pain has returned, states it increased while using urinal, patient heart rate note to increase to 110s while using restroom.

## 2015-09-24 NOTE — ED Notes (Signed)
Attempted to call report

## 2015-09-24 NOTE — ED Notes (Addendum)
Patient using urinal

## 2015-09-24 NOTE — ED Provider Notes (Signed)
[facility] DEPT Provider Note   CSN: [id number] Arrival date & time: 09/24/15  1529     History   Chief Complaint Chief Complaint  Patient presents with  . Chest Pain    HPI [name] is a 82 y.o. male.  HPI Patient had a hospitalization approximately one week ago for chest pain and shortness of breath. He states that today his chest pain returned and his heart rate has been irregular. He has felt increasing shortness of breath. He has been experiencing weakness throughout the course of the week since his hospitalization. Patient reports he has been compliant with his prescribed medications. He reports that despite taking his medications, he is having increasing swelling in his legs. He reports since starting hydralazine he has been urinating a lot. He reports he had been instructed to take Florinef but that medication he is not taking. He has been having episodes of dizziness and weakness that been variable in intensity. No syncopal episodes. Past Medical History:  Diagnosis Date  . Asthma   . BPH (benign prostatic hyperplasia)    severe; s/p multiple biopsies  . CAD (coronary artery disease)   . Carotid artery occlusion   . Chronic rhinitis   . COPD (chronic obstructive pulmonary disease) (HCC)    2L [place] O2  . Hyperlipidemia   . Hypertension   . Peripheral vascular disease ([place])   . 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 Syncope and collapse   . Ureteral tumor 08/2015   had endoscopic procedure for evaluation, unable to reach for biopsy    Patient Active Problem List   Diagnosis Date Noted  . Chest pain 09/13/2015  . BPH (benign prostatic hyperplasia) 09/13/2015  . GERD (gastroesophageal reflux disease) 09/13/2015  . Anxiety 09/13/2015  . Sinusitis, chronic 09/09/2015  . Chronic hypoxemic respiratory failure ([place]) 05/21/2015  . Dyspnea 04/15/2015  . Acute cor pulmonale ([place]) 03/18/2015  . Right calf pain 06/18/2014  . Fatigue 10/28/2012  . CAD (coronary artery disease)     . Peripheral vascular disease ([place])   . Hypersomnia, idiopathic 11/18/2010  . Essential hypertension   . Obstructive sleep apnea   . Rhinitis, nonallergic, chronic 01/29/2007  . COPD GOLD IV/ 02 dep /steroid dep 01/29/2007  . Chronic respiratory failure with hypoxia and hypercapnia ([place]) 01/29/2007  . Hyperlipidemia     Past Surgical History:  Procedure Laterality Date  . CATARACT EXTRACTION  11-20-11  . CORONARY ARTERY BYPASS GRAFT  2003  . LITHOTRIPSY    . PROSTATE BIOPSY  Oct. 2014       Home Medications    Prior to Admission medications   Medication Sig Start Date End Date Taking? Authorizing Provider  ALPRAZolam (XANAX) 0.25 MG tablet Take 0.25 mg by mouth 2 (two) times daily as needed for anxiety.   Yes Historical Provider, MD  aspirin 81 MG tablet Take 81 mg by mouth daily.   Yes Historical Provider, MD  atorvastatin (LIPITOR) 20 MG tablet Take 20 mg by mouth daily at 6 PM.   Yes Historical Provider, MD  Cholecalciferol (VITAMIN D3) 2000 units TABS Take 2,000 Units by mouth daily.   Yes Historical Provider, MD  finasteride (PROSCAR) 5 MG tablet Take 5 mg by mouth every morning.   Yes Historical Provider, MD  fluticasone (FLONASE) 50 MCG/ACT nasal spray Place 2 sprays into both nostrils daily.   Yes Historical Provider, MD  folic acid (FOLVITE) 1 MG tablet Take 1 tablet (1 mg total) by mouth daily. 09/16/15  Yes Thurnell Lose, MD  furosemide (LASIX) 20 MG tablet Take 20 mg by mouth as needed for edema.   Yes Historical Provider, MD  hydrALAZINE (APRESOLINE) 25 MG tablet Take 1 tablet (25 mg total) by mouth 2 (two) times daily. 09/16/15  Yes Thurnell Lose, MD  loratadine (CLARITIN) 10 MG tablet Take 10 mg by mouth daily as needed (drippy nose and drainage). Reported on 05/24/2015   Yes Historical Provider, MD  metoprolol succinate (TOPROL XL) 25 MG 24 hr tablet TAKE 1 TABLET(25 MG) BY MOUTH DAILY AT NIGHT 09/22/15  Yes Burtis Junes, NP  Multiple Vitamin (MULTIVITAMIN)  tablet Take 1 tablet by mouth daily.   Yes Historical Provider, MD  NITROSTAT 0.4 MG SL tablet DISSOLVE 1 TABLET UNDER THE TONGUE AS DIRECTED AND AS NEEDED FOR CHEST PAIN 05/13/15  Yes Belva Crome, MD  omeprazole (PRILOSEC) 40 MG capsule TAKE 1 CAPSULE(40 MG) BY MOUTH TWICE DAILY BEFORE A MEAL 06/21/15  Yes Tanda Rockers, MD  OXYGEN Take 2 L by mouth daily as needed. 2 l/m at bedtime and as needed for shortness of breath   Yes Historical Provider, MD  predniSONE (DELTASONE) 10 MG tablet Take 10 mg by mouth daily with breakfast.   Yes Historical Provider, MD  SYMBICORT 160-4.5 MCG/ACT inhaler INHALE 2 PUFFS BY MOUTH EVERY MORNING AND THEN ANOTHER 2 PUFFS ABOUT 12 HOURS LATER 06/21/15  Yes Tanda Rockers, MD  thiamine 100 MG tablet Take 1 tablet (100 mg total) by mouth daily. 09/16/15  Yes Thurnell Lose, MD  Tiotropium Bromide Monohydrate (SPIRIVA RESPIMAT) 2.5 MCG/ACT AERS Inhale 2 puffs into the lungs daily.   Yes Historical Provider, MD  XOPENEX HFA 45 MCG/ACT inhaler INHALE 2 PUFFS BY MOUTH EVERY 4 HOURS AS NEEDED FOR WHEEZING OR SHORTNESS OF BREATH 05/28/15  Yes Tanda Rockers, MD  budesonide (PULMICORT) 0.5 MG/2ML nebulizer solution Take 2 mLs (0.5 mg total) by nebulization 2 (two) times daily. Patient not taking: Reported on 09/22/2015 09/10/15   Rush Landmark, MD    Family History Family History  Problem Relation Age of Onset  . Heart disease Mother   . Breast cancer Mother   . Cancer Mother     Breast cancer  . Hypertension Mother   . Other Mother     AAA  and   Amputation  . Heart disease Father     Before age 79  . Heart attack Father   . Stroke Brother     x3, still living   . Peripheral vascular disease Brother   . Heart disease Brother   . Hyperlipidemia Brother   . Hypertension Brother   . Heart disease Brother   . Allergies Brother     Social History Social History  Substance Use Topics  . Smoking status: Former Smoker    Packs/day: 2.00    Years: 33.00     Types: Cigarettes    Quit date: 01/09/1982  . Smokeless tobacco: Never Used  . Alcohol use 0.0 oz/week     Comment: 1.5-3 oz daily in the past, minimal use in the last few months     Allergies   Sulfonamide derivatives; Cefdinir; Ciprofloxacin; Nitrofurantoin; and Levaquin [levofloxacin]   Review of Systems Review of Systems 10 Systems reviewed and are negative for acute change except as noted in the HPI.   Physical Exam Updated Vital Signs BP (!) 169/111   Pulse 87   Temp 98 F (36.7 C) (  Oral)   Resp 15   Ht 5\' 8"  (1.727 m)   Wt 137 lb (62.1 kg)   SpO2 97%   BMI 20.83 kg/m   Physical Exam  Constitutional: He is oriented to person, place, and time. He appears well-developed and well-nourished.  Patient is in no acute distress. He is alert and appropriate.  HENT:  Head: Normocephalic and atraumatic.  Nose: Nose normal.  Mouth/Throat: Oropharynx is clear and moist.  Eyes: EOM are normal.  Neck: Neck supple.  Cardiovascular:  Tachycardia with occasional ectopic beats. No gross murmur.  Pulmonary/Chest: Effort normal.  Mild Rales at bases. Good air flow throughout.  Abdominal: Soft. He exhibits no distension. There is no tenderness.  Musculoskeletal: Normal range of motion.  Patient has approximately 1+ pitting bilateral lower extremities no calf tenderness.  Neurological: He is alert and oriented to person, place, and time. No cranial nerve deficit. He exhibits normal muscle tone. Coordination normal.  Skin: Skin is warm and dry.  Psychiatric: He has a normal mood and affect.     ED Treatments / Results  Labs (all labs ordered are listed, but only abnormal results are displayed) Labs Reviewed  BASIC METABOLIC PANEL - Abnormal; Notable for the following:       Result Value   Glucose, Bld 150 (*)    All other components within normal limits  CBC  MAGNESIUM  I-STAT TROPOININ, ED    EKG  EKG Interpretation  Date/Time:  Friday September 24 2015 15:35:46  EDT Ventricular Rate:  112 PR Interval:  144 QRS Duration: 124 QT Interval:  340 QTC Calculation: 464 R Axis:   162 Text Interpretation:  Sinus tachycardia with Premature atrial complexes Right atrial enlargement Right bundle branch block Abnormal ECG agree. new RBBB compared to old. Confirmed by Johnney Killian, MD, Jeannie Done [phone]) on 09/24/2015 3:42:26 PM       Radiology Dg Chest 2 View  Result Date: 09/24/2015 CLINICAL DATA:  82 year old male chest pain shortness of breath and abdominal pain. Recent hospitalization for similar symptoms. Initial encounter. EXAM: CHEST  2 VIEW COMPARISON:  09/12/2015 and earlier FINDINGS: Stable large lung volumes. Sequelae of CABG. Stable cardiac size and mediastinal contours. No cardiomegaly. Calcified aortic atherosclerosis. No pneumothorax, pulmonary edema, pleural effusion or confluent pulmonary opacity. Osteopenia. No acute osseous abnormality identified. Negative visible bowel gas pattern. IMPRESSION: Chronic pulmonary hyperinflation and Calcified aortic atherosclerosis. No acute cardiopulmonary abnormality. Electronically Signed   By: Genevie Ann M.D.   On: 09/24/2015 16:07    Procedures Procedures (including critical care time)  Medications Ordered in ED Medications  metoprolol (LOPRESSOR) injection 5 mg (5 mg Intravenous Given 09/24/15 1840)  aspirin chewable tablet 324 mg (324 mg Oral Given 09/24/15 1841)  ALPRAZolam (XANAX) tablet 0.25 mg (0.25 mg Oral Given 09/24/15 1841)     Initial Impression / Assessment and Plan / ED Course  I have reviewed the triage vital signs and the nursing notes.  Pertinent labs & imaging results that were available during my care of the patient were reviewed by me and considered in my medical decision making (see chart for details).  Clinical Course   Consult: (18:45) reviewed with cardiology, will see in the emergency department.  Final Clinical Impressions(s) / ED Diagnoses   Final diagnoses:  New right bundle  branch block  Chest pain, unspecified chest pain type  Labile hypertension  Atrial tachycardia (HCC)    New Prescriptions New Prescriptions   No medications on file     Charlesetta Shanks,  MD 09/28/15 2000

## 2015-09-24 NOTE — ED Notes (Signed)
Lab called to add on Magnesium  

## 2015-09-24 NOTE — ED Notes (Signed)
EDP to bedside, rhythm strip shown to provider, multifocal PVCs and bigimeny PVCs noted, no prior EKG with same rhythm noted

## 2015-09-24 NOTE — ED Notes (Addendum)
EDP at bedside, cardiology will come to see patient

## 2015-09-24 NOTE — ED Triage Notes (Signed)
Pt reports feeling bad for around a week, he reports he began to experience chest discomfort X2 hours. He denies cardiac hx but he has COPD and wears 2L of o2 at home. Pt reports some increased shortness of breath.

## 2015-09-24 NOTE — Consult Note (Deleted)
Reason for Consult: chest pain  Referring Physician:ER MD Dr. Johnney Killian  PCP:   Melinda Crutch, MD  Primary Cardiologist:Dr. Krithik Mapel is an 82 y.o. male.    Chief Complaint: Chest pain  HPI: 82 yr old male presents with chest pain.  He has a history of CAD with prior coronary bypass grafting x 5 in 2003 per [facility], COPD with cor pulmonale, hyperlipidemia, hypertension, PAD with carotid disease (followed by [facility]), alcohol abuse and history of carotid obstructive disease.  He was last seen back in March - felt to be doing ok. Was on chronic O2 therapy.   Phone call earlier this week - "Pt was in the hospital for BP issues. His BP fluctuate up and down. Pt was D/C on 09/16/15. BP on D/C was 137/73. This morning's pt's BP prior taking his BP medicine was 182/73. An hour after taking Metoprolol Succinate 25 mg and Hydralazine 25 mg BP was 137/73. Pt has an appointment with Dr. Tamala Julian on 9/19 pt would like to be seen sooner . An appointment was made with [name] NP on 09/22/15 at 11:30 AM. Pt is aware."  Noted that he was admitted earlier this month with chest tightness and BP issues. Chest pain was felt to be atypical and initially an outpatient Myovew was recommended but then this was felt that it would not give much value and so no further work up was recommended by cardiology (Dr. Wynonia Lawman). He was noted to have severe orthostatic hypotension felt to be due to prednisone induced adrenal insufficiency and was treated with steroids, Florinef and IVF. The record was noted that the patient and his wife are "severely noncompliant and frequently refuse medications and instructions".   Was seen in the office 09/22/15 has home O2.  He has ongoing chest pain and when seen in office did not wish to have more testing.  Current med therapy continued.   Ortho BPs on the 13th : Orthostatics checked - he feels "fuzzy" at rest Lying BP today is 170/81 with HR of 87 Sitting BP is  148/70 with HR of 83 Standing BP is 132/73 with HR of 91  Pt not on florinef due to elevated AM BP readings.  ? Hydralazine decrease   Lasix held, toprol changed to nighttime.  Now presented with "glowing discomfort in mid chest"  Pressing on chest sometimes help.  Today and yesterday occurred after BK.  None after lunch or dinner.  BP at those times usually good.  Later today BP was stable at 785 systolic,  Then after some time felt cloudy 160/90 BP at that time.  Then BP remained high.  These are more recent BP fluctuations.  The pressure comes and goes.  He is putting out more urine on hydralazine.  Lasix held.  Currently no chest pain.  Overall feels worse with higher BP.   Past Medical History:  Diagnosis Date  . Asthma   . BPH (benign prostatic hyperplasia)    severe; s/p multiple biopsies  . CAD (coronary artery disease)   . Carotid artery occlusion   . Chronic rhinitis   . COPD (chronic obstructive pulmonary disease) (HCC)    2L [place] O2  . Hyperlipidemia   . Hypertension   . Peripheral vascular disease ([place])   . 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 Syncope and collapse   . Ureteral tumor 08/2015   had endoscopic procedure for evaluation, unable to reach for biopsy  Past Surgical History:  Procedure Laterality Date  . CATARACT EXTRACTION  11-20-11  . CORONARY ARTERY BYPASS GRAFT  2003  . LITHOTRIPSY    . PROSTATE BIOPSY  Oct. 2014    Family History  Problem Relation Age of Onset  . Heart disease Mother   . Breast cancer Mother   . Cancer Mother     Breast cancer  . Hypertension Mother   . Other Mother     AAA  and   Amputation  . Heart disease Father     Before age 45  . Heart attack Father   . Stroke Brother     x3, still living   . Peripheral vascular disease Brother   . Heart disease Brother   . Hyperlipidemia Brother   . Hypertension Brother   . Heart disease Brother   . Allergies Brother    Social History:  reports that he quit smoking about 33 years ago.  His smoking use included Cigarettes. He has a 66.00 pack-year smoking history. He has never used smokeless tobacco. He reports that he drinks alcohol. He reports that he does not use drugs.  Allergies:  Allergies  Allergen Reactions  . Sulfonamide Derivatives Swelling    REACTION: facial/tongue swelling  . Cefdinir Diarrhea    REACTION: diarrhea  . Ciprofloxacin Itching and Rash    Red itchy hands  . Nitrofurantoin Swelling    Swollen hands  . Levaquin [Levofloxacin] Itching and Rash    OUTPATIENT MEDICATIONS: No current facility-administered medications on file prior to encounter.    Current Outpatient Prescriptions on File Prior to Encounter  Medication Sig Dispense Refill  . ALPRAZolam (XANAX) 0.25 MG tablet Take 0.25 mg by mouth 2 (two) times daily as needed for anxiety.    Marland Kitchen aspirin 81 MG tablet Take 81 mg by mouth daily.    Marland Kitchen atorvastatin (LIPITOR) 20 MG tablet Take 20 mg by mouth daily at 6 PM.    . Cholecalciferol (VITAMIN D3) 2000 units TABS Take 2,000 Units by mouth daily.    . finasteride (PROSCAR) 5 MG tablet Take 5 mg by mouth every morning.    . fluticasone (FLONASE) 50 MCG/ACT nasal spray Place 2 sprays into both nostrils daily.    . folic acid (FOLVITE) 1 MG tablet Take 1 tablet (1 mg total) by mouth daily. 30 tablet 0  . furosemide (LASIX) 20 MG tablet Take 20 mg by mouth as needed for edema.    . hydrALAZINE (APRESOLINE) 25 MG tablet Take 1 tablet (25 mg total) by mouth 2 (two) times daily. 60 tablet 0  . loratadine (CLARITIN) 10 MG tablet Take 10 mg by mouth daily as needed (drippy nose and drainage). Reported on 05/24/2015    . metoprolol succinate (TOPROL XL) 25 MG 24 hr tablet TAKE 1 TABLET(25 MG) BY MOUTH DAILY AT NIGHT 30 tablet 10  . Multiple Vitamin (MULTIVITAMIN) tablet Take 1 tablet by mouth daily.    Marland Kitchen NITROSTAT 0.4 MG SL tablet DISSOLVE 1 TABLET UNDER THE TONGUE AS DIRECTED AND AS NEEDED FOR CHEST PAIN 25 tablet 5  . omeprazole (PRILOSEC) 40 MG capsule  TAKE 1 CAPSULE(40 MG) BY MOUTH TWICE DAILY BEFORE A MEAL 60 capsule 5  . OXYGEN Take 2 L by mouth daily as needed. 2 l/m at bedtime and as needed for shortness of breath    . predniSONE (DELTASONE) 10 MG tablet Take 10 mg by mouth daily with breakfast.    . SYMBICORT 160-4.5 MCG/ACT inhaler INHALE 2  PUFFS BY MOUTH EVERY MORNING AND THEN ANOTHER 2 PUFFS ABOUT 12 HOURS LATER 10.2 g 5  . thiamine 100 MG tablet Take 1 tablet (100 mg total) by mouth daily. 30 tablet 0  . Tiotropium Bromide Monohydrate (SPIRIVA RESPIMAT) 2.5 MCG/ACT AERS Inhale 2 puffs into the lungs daily.    [name] HFA 45 MCG/ACT inhaler INHALE 2 PUFFS BY MOUTH EVERY 4 HOURS AS NEEDED FOR WHEEZING OR SHORTNESS OF BREATH 15 g 2  . budesonide (PULMICORT) 0.5 MG/2ML nebulizer solution Take 2 mLs (0.5 mg total) by nebulization 2 (two) times daily. (Patient not taking: Reported on 09/22/2015) 60 mL 3     Results for orders placed or performed during the hospital encounter of 09/24/15 (from the past 48 hour(s))  Basic metabolic panel     Status: Abnormal   Collection Time: 09/24/15  3:41 PM  Result Value Ref Range   Sodium 138 135 - 145 mmol/L   Potassium 4.4 3.5 - 5.1 mmol/L   Chloride 102 101 - 111 mmol/L   CO2 28 22 - 32 mmol/L   Glucose, Bld 150 (H) 65 - 99 mg/dL   BUN 12 6 - 20 mg/dL   Creatinine, Ser 1.07 0.61 - 1.24 mg/dL   Calcium 9.9 8.9 - 10.3 mg/dL   GFR calc non Af Amer >60 >60 mL/min   GFR calc Af Amer >60 >60 mL/min    Comment: (NOTE) The eGFR has been calculated using the CKD EPI equation. This calculation has not been validated in all clinical situations. eGFR's persistently <60 mL/min signify possible Chronic Kidney Disease.    Anion gap 8 5 - 15  CBC     Status: None   Collection Time: 09/24/15  3:41 PM  Result Value Ref Range   WBC 9.3 4.0 - 10.5 K/uL   RBC 4.48 4.22 - 5.81 MIL/uL   Hemoglobin 13.9 13.0 - 17.0 g/dL   HCT 43.8 39.0 - 52.0 %   MCV 97.8 78.0 - 100.0 fL   MCH 31.0 26.0 - 34.0 pg   MCHC  31.7 30.0 - 36.0 g/dL   RDW 12.5 11.5 - 15.5 %   Platelets 220 150 - 400 K/uL  Magnesium     Status: None   Collection Time: 09/24/15  3:41 PM  Result Value Ref Range   Magnesium 2.0 1.7 - 2.4 mg/dL  I-stat troponin, ED     Status: None   Collection Time: 09/24/15  3:52 PM  Result Value Ref Range   Troponin i, poc 0.01 0.00 - 0.08 ng/mL   Comment 3            Comment: Due to the release kinetics of cTnI, a negative result within the first hours of the onset of symptoms does not rule out myocardial infarction with certainty. If myocardial infarction is still suspected, repeat the test at appropriate intervals.    Dg Chest 2 View  Result Date: 09/24/2015 CLINICAL DATA:  82 year old male chest pain shortness of breath and abdominal pain. Recent hospitalization for similar symptoms. Initial encounter. EXAM: CHEST  2 VIEW COMPARISON:  09/12/2015 and earlier FINDINGS: Stable large lung volumes. Sequelae of CABG. Stable cardiac size and mediastinal contours. No cardiomegaly. Calcified aortic atherosclerosis. No pneumothorax, pulmonary edema, pleural effusion or confluent pulmonary opacity. Osteopenia. No acute osseous abnormality identified. Negative visible bowel gas pattern. IMPRESSION: Chronic pulmonary hyperinflation and Calcified aortic atherosclerosis. No acute cardiopulmonary abnormality. Electronically Signed   By: Genevie Ann M.D.   On: 09/24/2015 16:07  ROS: General:no colds or fevers, mild weight gain Skin:no rashes or ulcers HEENT:no blurred vision, no congestion CV:see HPI PUL:see HPI GI:no diarrhea constipation or melena, no indigestion GU:no hematuria, no dysuria MS:no joint pain, no claudication Neuro:no syncope, no lightheadedness Endo:no diabetes, no thyroid disease   Blood pressure (!) 169/111, pulse 87, temperature 98 F (36.7 C), temperature source Oral, resp. rate 15, height 5' 8"  (1.727 m), weight 137 lb (62.1 kg), SpO2 97 %.  Wt Readings from Last 3 Encounters:    09/24/15 137 lb (62.1 kg)  09/22/15 137 lb 1.9 oz (62.2 kg)  09/16/15 133 lb 1.6 oz (60.4 kg)    UG:QBVQXIH:WTUUEKCM affect, NAD Skin:Warm and dry, brisk capillary refill HEENT:normocephalic, sclera clear, mucus membranes moist Neck:supple, no JVD, no bruits  Heart:S1S2 RRR without murmur, gallup, rub or click Lungs:clear without rales, rhonchi, or wheezes KLK:JZPH, non tender, + BS, do not palpate liver spleen or masses Ext:no lower ext edema, 2+ pedal pulses, 2+ radial pulses Neuro:alert and oriented X 3, MAE, follows commands, + facial symmetry    Assessment/Plan Chest pain troponin neg,   HTN   Cecilie Kicks  Nurse Practitioner Certified [place] Pager [phone] or after 5pm or weekends call [phone] 09/24/2015, 7:18 PM

## 2015-09-24 NOTE — H&P (Signed)
HISTORY AND PHYSICAL     Referring Physician:ER MD: Dr. Johnney Killian  PCP: Melinda Crutch, MD  Primary Cardiologist: Dr. Tamala Julian   Chief Complaint: Chest pain and blood pressure fluctuation  HPI: 82 yr old male presents with chest pain and elevated blood pressure.  He has a history of CAD with prior coronary bypass grafting x 5 in 2003 per [facility], COPD with cor pulmonale, hyperlipidemia, hypertension, PAD with carotid disease (followed by [facility]), alcohol abuse and history of carotid obstructive disease.  Pt was in the hospital last week for BP issues. His BPfluctuate up and down. Pt was D/C on 09/16/15. BP on D/C was 137/73. Felt to be component of adrenal insufficiency with orthostasis, placed on higher dose oral steroids and antihypertensive regimen was adjusted. He did have atypical sounding chest pain at that time, was seen by cardiology, initially an outpatient Myovew was recommended but then this was felt that it would not give much value and so no further work up was recommended by cardiology (Dr. Wynonia Lawman). The record notes that the patient and his wife are "severely noncompliant and frequently refuse medications and instructions," but he reports taking his medications as instructed over the last week. Saw his PCP a day after getting out of the hospital at which time prednisone was decreased from 20 mg to 10 mg daily. He has been taking hydralazine and Toprol-XL regularly.  Was seen in the office 09/22/15 has home O2.  He has ongoing chest pain and when seen in office did not wish to have more testing.  Current med therapy continued.   Ortho BPs on the 13th : Orthostatics checked - he feels "fuzzy" at rest Lying BP today is 170/81 with HR of 87 Sitting BP is 148/70 with HR of 83 Standing BP is 132/73 with HR of 91  Pt not on florinef due to elevated AM BP readings but taking oral prednisone.   Lasix held, Toprol XL changed to nighttime.  Now presented with "glowing  discomfort in mid chest"  Pressing on chest sometimes help.  Today and yesterday occurred after breakfast.  None after lunch or dinner.  BP at those times usually good.  Later today BP was stable at 453 systolic, Then after some time felt cloudy 160/90 BP at that time.  Then BP remained high.  These are more recent BP fluctuations.  The pressure comes and goes.  He is putting out more urine on hydralazine. BP in ER 173/106.  Currently no chest pain.  Overall feels worse with higher BP.       Past Medical History:  Diagnosis Date  . Asthma   . BPH (benign prostatic hyperplasia)    severe; s/p multiple biopsies  . CAD (coronary artery disease)   . Carotid artery occlusion   . Chronic rhinitis   . COPD (chronic obstructive pulmonary disease) (HCC)    2L [place] O2  . Hyperlipidemia   . Hypertension   . Peripheral vascular disease ([place])   . 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 Syncope and collapse   . Ureteral tumor 08/2015   had endoscopic procedure for evaluation, unable to reach for biopsy         Past Surgical History:  Procedure Laterality Date  . CATARACT EXTRACTION  11-20-11  . CORONARY ARTERY BYPASS GRAFT  2003  . LITHOTRIPSY    . PROSTATE BIOPSY  Oct. 2014          Family History  Problem Relation Age  of Onset  . Heart disease Mother   . Breast cancer Mother   . Cancer Mother     Breast cancer  . Hypertension Mother   . Other Mother     AAA  and   Amputation  . Heart disease Father     Before age 48  . Heart attack Father   . Stroke Brother     x3, still living   . Peripheral vascular disease Brother   . Heart disease Brother   . Hyperlipidemia Brother   . Hypertension Brother   . Heart disease Brother   . Allergies Brother    Social History:  reports that he quit smoking about 33 years ago. His smoking use included Cigarettes. He has a 66.00 pack-year smoking history. He has never used smokeless tobacco. He reports that he  drinks alcohol. He reports that he does not use drugs.  Allergies:       Allergies  Allergen Reactions  . Sulfonamide Derivatives Swelling    REACTION: facial/tongue swelling  . Cefdinir Diarrhea    REACTION: diarrhea  . Ciprofloxacin Itching and Rash    Red itchy hands  . Nitrofurantoin Swelling    Swollen hands  . Levaquin [Levofloxacin] Itching and Rash    OUTPATIENT MEDICATIONS: No current facility-administered medications on file prior to encounter.          Current Outpatient Prescriptions on File Prior to Encounter  Medication Sig Dispense Refill  . ALPRAZolam (XANAX) 0.25 MG tablet Take 0.25 mg by mouth 2 (two) times daily as needed for anxiety.    Marland Kitchen aspirin 81 MG tablet Take 81 mg by mouth daily.    Marland Kitchen atorvastatin (LIPITOR) 20 MG tablet Take 20 mg by mouth daily at 6 PM.    . Cholecalciferol (VITAMIN D3) 2000 units TABS Take 2,000 Units by mouth daily.    . finasteride (PROSCAR) 5 MG tablet Take 5 mg by mouth every morning.    . fluticasone (FLONASE) 50 MCG/ACT nasal spray Place 2 sprays into both nostrils daily.    . folic acid (FOLVITE) 1 MG tablet Take 1 tablet (1 mg total) by mouth daily. 30 tablet 0  . furosemide (LASIX) 20 MG tablet Take 20 mg by mouth as needed for edema.    . hydrALAZINE (APRESOLINE) 25 MG tablet Take 1 tablet (25 mg total) by mouth 2 (two) times daily. 60 tablet 0  . loratadine (CLARITIN) 10 MG tablet Take 10 mg by mouth daily as needed (drippy nose and drainage). Reported on 05/24/2015    . metoprolol succinate (TOPROL XL) 25 MG 24 hr tablet TAKE 1 TABLET(25 MG) BY MOUTH DAILY AT NIGHT 30 tablet 10  . Multiple Vitamin (MULTIVITAMIN) tablet Take 1 tablet by mouth daily.    Marland Kitchen NITROSTAT 0.4 MG SL tablet DISSOLVE 1 TABLET UNDER THE TONGUE AS DIRECTED AND AS NEEDED FOR CHEST PAIN 25 tablet 5  . omeprazole (PRILOSEC) 40 MG capsule TAKE 1 CAPSULE(40 MG) BY MOUTH TWICE DAILY BEFORE A MEAL 60 capsule 5  . OXYGEN Take 2 L by  mouth daily as needed. 2 l/m at bedtime and as needed for shortness of breath    . predniSONE (DELTASONE) 10 MG tablet Take 10 mg by mouth daily with breakfast.    . SYMBICORT 160-4.5 MCG/ACT inhaler INHALE 2 PUFFS BY MOUTH EVERY MORNING AND THEN ANOTHER 2 PUFFS ABOUT 12 HOURS LATER 10.2 g 5  . thiamine 100 MG tablet Take 1 tablet (100 mg total) by mouth  daily. 30 tablet 0  . Tiotropium Bromide Monohydrate (SPIRIVA RESPIMAT) 2.5 MCG/ACT AERS Inhale 2 puffs into the lungs daily.    [name] HFA 45 MCG/ACT inhaler INHALE 2 PUFFS BY MOUTH EVERY 4 HOURS AS NEEDED FOR WHEEZING OR SHORTNESS OF BREATH 15 g 2  . budesonide (PULMICORT) 0.5 MG/2ML nebulizer solution Take 2 mLs (0.5 mg total) by nebulization 2 (two) times daily. (Patient not taking: Reported on 09/22/2015) 60 mL 3     Lab Results Last 48 Hours        Results for orders placed or performed during the hospital encounter of 09/24/15 (from the past 48 hour(s))  Basic metabolic panel     Status: Abnormal   Collection Time: 09/24/15  3:41 PM  Result Value Ref Range   Sodium 138 135 - 145 mmol/L   Potassium 4.4 3.5 - 5.1 mmol/L   Chloride 102 101 - 111 mmol/L   CO2 28 22 - 32 mmol/L   Glucose, Bld 150 (H) 65 - 99 mg/dL   BUN 12 6 - 20 mg/dL   Creatinine, Ser 1.07 0.61 - 1.24 mg/dL   Calcium 9.9 8.9 - 10.3 mg/dL   GFR calc non Af Amer >60 >60 mL/min   GFR calc Af Amer >60 >60 mL/min    Comment: (NOTE) The eGFR has been calculated using the CKD EPI equation. This calculation has not been validated in all clinical situations. eGFR's persistently <60 mL/min signify possible Chronic Kidney Disease.   Anion gap 8 5 - 15  CBC     Status: None   Collection Time: 09/24/15  3:41 PM  Result Value Ref Range   WBC 9.3 4.0 - 10.5 K/uL   RBC 4.48 4.22 - 5.81 MIL/uL   Hemoglobin 13.9 13.0 - 17.0 g/dL   HCT 43.8 39.0 - 52.0 %   MCV 97.8 78.0 - 100.0 fL   MCH 31.0 26.0 - 34.0 pg   MCHC 31.7 30.0 - 36.0 g/dL    RDW 12.5 11.5 - 15.5 %   Platelets 220 150 - 400 K/uL  Magnesium     Status: None   Collection Time: 09/24/15  3:41 PM  Result Value Ref Range   Magnesium 2.0 1.7 - 2.4 mg/dL  I-stat troponin, ED     Status: None   Collection Time: 09/24/15  3:52 PM  Result Value Ref Range   Troponin i, poc 0.01 0.00 - 0.08 ng/mL   Comment 3            Comment: Due to the release kinetics of cTnI, a negative result within the first hours of the onset of symptoms does not rule out myocardial infarction with certainty. If myocardial infarction is still suspected, repeat the test at appropriate intervals.      Imaging Results (Last 48 hours)  Dg Chest 2 View  Result Date: 09/24/2015 CLINICAL DATA:  82 year old male chest pain shortness of breath and abdominal pain. Recent hospitalization for similar symptoms. Initial encounter. EXAM: CHEST  2 VIEW COMPARISON:  09/12/2015 and earlier FINDINGS: Stable large lung volumes. Sequelae of CABG. Stable cardiac size and mediastinal contours. No cardiomegaly. Calcified aortic atherosclerosis. No pneumothorax, pulmonary edema, pleural effusion or confluent pulmonary opacity. Osteopenia. No acute osseous abnormality identified. Negative visible bowel gas pattern. IMPRESSION: Chronic pulmonary hyperinflation and Calcified aortic atherosclerosis. No acute cardiopulmonary abnormality. Electronically Signed   By: Genevie Ann M.D.   On: 09/24/2015 16:07     ROS: General:no colds or fevers, mild weight gain  Skin:no rashes or ulcers HEENT:no blurred vision, no congestion CV:see HPI PUL:see HPI GI:no diarrhea constipation or melena, no indigestion GU:no hematuria, no dysuria MS:no joint pain, no claudication Neuro:no syncope, no lightheadedness Endo:no diabetes, no thyroid disease   Blood pressure (!) 169/111, pulse 87, temperature 98 F (36.7 C), temperature source Oral, resp. rate 15, height '5\' 8"'$  (1.727 m), weight 137 lb (62.1 kg), SpO2 97 %.     Wt  Readings from Last 3 Encounters:  09/24/15 137 lb (62.1 kg)  09/22/15 137 lb 1.9 oz (62.2 kg)  09/16/15 133 lb 1.6 oz (60.4 kg)    GH:WEXHBZJ:IRCVELFY affect, NAD Skin:Warm and dry, brisk capillary refill HEENT:normocephalic, sclera clear, mucus membranes moist Neck:supple, no JVD, no bruits  Heart:S1S2 RRR without murmur, gallup, rub or click Lungs:clear without rales, rhonchi, or wheezes BOF:BPZW, non tender, + BS, do not palpate liver spleen or masses Ext:no lower ext edema, 2+ pedal pulses, 2+ radial pulses Neuro:alert and oriented X 3, MAE, follows commands, + facial symmetry    Assessment/Plan Chest pain, initial troponin neg. ECG with more complete right bundle-branch block pattern. No further hypoxia on standard supplemental oxygen. Chest x-ray without acute findings.  Fluctuating hypertension with recently documented orthostasis and concerns about adrenal insufficiency.  Cecilie Kicks  Nurse Practitioner Certified [facility] Pager [phone] or after 5pm or weekends call [phone] 09/24/2015, 7:18 PM   Attending note:  Patient seen and examined. Reviewed records including recent hospital course. [name] is followed by Dr. Tamala Julian, was seen recently in the office by Ms. Gerhardt NP. His history includes oxygen-dependent COPD (on prednisone 5 mg daily previously per Dr. [name]), CAD status post CABG in 2003, carotid artery disease, anxiety on Xanax, and hyperlipidemia. He has had fairly recent difficulties with significant orthostasis alternating with blood pressure elevations that are associated with a feeling of fullness in his head and intermittent chest "pressure." Chest pain not always described consistently, sometimes feels like more of a muscle tightness, improves when he rubs his left upper chest. During most recent hospital stay there were concerns about potential recurrent adrenal insufficiency and he was placed on prednisone 20 mg daily, continued  on Toprol-XL, and also started on hydralazine. This regimen at least per discharge summary did lead to improvement in blood pressure, however patient states that ever since discharge he continues to feel completely fatigued, lightheaded when he stands, and reports intermittent chest pain as outlined. He did see his PCP following discharge, prednisone was reduced to 10 mg daily, he was never started on Florinef. Patient states that he has been taking hydralazine and Toprol-XL as directed each day. Does not have therapeutic compression stockings, bought some over-the-counter.  On examination the ER blood pressure elevated in the 170s over 100s, heart rate in 80s to 90s in sinus rhythm with frequent PACs. Relatively thin elderly male in no distress. Lungs exhibit decreased breath sounds but no active wheezing. Cardiac exam with RRR and frequent ectopy, soft systolic murmur without gallop. He does not have any peripheral edema. No abdominal bruit. Lab work shows potassium 4.4, creatinine 1.0, troponin I 0.01, hemoglobin 13.9. Initial ECG showed sinus tachycardia with complete right bundle-branch block which was progressed compared to the prior tracing, possible right atrial enlargement and diffuse nonspecific ST-T wave abnormalities.  Patient presents with complaints of chest pain having both typical and atypical features in the setting of uncontrolled hypertension and recent problems with orthostasis that were felt as of last admission to be related  to adrenal insufficiency due to chronic low-dose steroids. He has felt symptomatically worse over the last week. Plan is admission to the hospital for further medication adjustments, cycle cardiac markers, but at this point not entirely clear what direction to take in terms of ischemic evaluation. He has not had any formal testing recently, and a Lexiscan Myoview might be reasonable to exclude any high degree of ischemic burden that could be contributing to an increased  stress state. Would continue Toprol-XL, stop hydralazine (can use IV doses as needed if systolic blood pressure greater than 707 and diastolic greater than 615), try Norvasc at 5 mg daily, and increase prednisone dose back to 20 mg daily for now. Cycle cardiac markers and follow-up ECG in the morning. We will also obtain an abdominal CT, he has actually already been scheduled for a renal artery duplex based on his most recent outpatient visit. He needs to have therapeutic compression stockings that are measured and fitted.  Satira Sark, M.D., F.A.C.C.

## 2015-09-24 NOTE — Telephone Encounter (Signed)
Received a call from patient.He stated he feels awful.Stated he is dizzy,chest pressure.Rates chest pressure # 4.Advised he needs to go to ER.Trish called.

## 2015-09-24 NOTE — Telephone Encounter (Signed)
New Message   Pt c/o BP issue: STAT if pt c/o blurred vision, one-sided weakness or slurred speech  1. What are your last 5 BP readings? 9/15 845am 106-59...11am 98/59...... 2pm 168/81  2. Are you having any other symptoms (ex. Dizziness, headache, blurred vision, passed out)? lethargic   3. What is your BP issue?  Pt call requesting  To speak with RN. Pt states he is concerned with making it through the weekend. Pt states at 8:45am this morning 9/15 his bp   106/59 higher in the morning.. And drops low when standing up. Please call back to discus   Feeling bad after eating breakfast  Standing it didn't drop much dizzy chest discomfort   New med hydrolozine

## 2015-09-24 NOTE — Progress Notes (Signed)
Attempted to receive report.  RN unavailable at this time.  Will call back.

## 2015-09-25 DIAGNOSIS — I208 Other forms of angina pectoris: Secondary | ICD-10-CM

## 2015-09-25 LAB — TROPONIN I
Troponin I: 0.03 ng/mL (ref <0.03)
Troponin I: <0.03 ng/mL (ref <0.03)

## 2015-09-25 LAB — BASIC METABOLIC PANEL
Anion gap: 7 (ref 5–15)
BUN: 10 mg/dL (ref 6–20)
CO2: 29 mmol/L (ref 22–32)
Calcium: 9.1 mg/dL (ref 8.9–10.3)
Chloride: 104 mmol/L (ref 101–111)
Creatinine, Ser: 0.95 mg/dL (ref 0.61–1.24)
GFR calc Af Amer: >60 mL/min (ref >60)
GFR calc non Af Amer: >60 mL/min (ref >60)
Glucose, Bld: 100 mg/dL — ABNORMAL HIGH (ref 65–99)
Potassium: 3.6 mmol/L (ref 3.5–5.1)
Sodium: 140 mmol/L (ref 135–145)

## 2015-09-25 LAB — MAGNESIUM: Magnesium: 2 mg/dL (ref 1.7–2.4)

## 2015-09-25 MED ORDER — BUDESONIDE-FORMOTEROL FUMARATE 160-4.5 MCG/ACT IN AERO
2.0000 | INHALATION_SPRAY | Freq: Two times a day (BID) | RESPIRATORY_TRACT | Status: DC
  Administered 2015-09-25 – 2015-09-26 (×4): 2 via RESPIRATORY_TRACT
  Filled 2015-09-25 (×2): qty 6

## 2015-09-25 MED ORDER — ALUM & MAG HYDROXIDE-SIMETH 200-200-20 MG/5ML PO SUSP: 30.0000 mL | Freq: Once | ORAL | Status: DC

## 2015-09-25 MED ORDER — TIOTROPIUM BROMIDE MONOHYDRATE 2.5 MCG/ACT IN AERS: 2.0000 | INHALATION_SPRAY | Freq: Every day | RESPIRATORY_TRACT | Status: DC

## 2015-09-25 MED ORDER — LEVALBUTEROL TARTRATE 45 MCG/ACT IN AERO: 2.0000 | INHALATION_SPRAY | Freq: Four times a day (QID) | RESPIRATORY_TRACT | Status: DC | PRN

## 2015-09-25 NOTE — Plan of Care (Signed)
Problem: Consults Goal: Chest Pain Patient Education (See Patient Education module for education specifics.) Outcome: Progressing Patient admitted with accelerated hypertension, orthostasis and chest pressure. Suspected adrenal mass ruled-out by CT imaging. BP on admission to floor 180/101; first dose of norvasc 5 mg PO and patient's home dose of  toprol XL 25 mg PO given per orders. Mild anxiety was also noted; medicated with alprazolam 0.25 mg PO with good response. Other vital signs were WDL for patient at the time.  BP was reassessed for response to medication at 04:27, showing significant reduction in both systolic and diastolic BP ([id number]). No changes noted in mental status; patient alert, oriented and symptom-free.  Will recheck BP and mental status again during patient hand-off this AM at 07:00.  Continuing to monitor.

## 2015-09-25 NOTE — Progress Notes (Addendum)
Patient Name: [name] Date of Encounter: 09/25/2015  Primary Cardiologist: Dr. Laurena Bering Problem List     Active Problems:   Angina at rest [facility])     Subjective   No significant chest pain. Has sensation of feeling worse with elevated blood pressure.  Previously described a glowing discomfort and mid chest when sometimes pressing on chest this seemed to help. He felt cloudy with blood pressure 160/90.  In the office setting on 09/22/15 orthostatics were abnormal with blood pressure 170 laying, 132 standing.   Previous hospitalization discharge on 9/7  felt to be component of adrenal insufficiency.  Inpatient Medications    . amLODipine  5 mg Oral Daily  . aspirin EC  81 mg Oral Daily  . atorvastatin  20 mg Oral q1800  . budesonide-formoterol  2 puff Inhalation BID  . cholecalciferol  2,000 Units Oral Daily  . finasteride  5 mg Oral q morning - 10a  . fluticasone  2 spray Each Nare Daily  . folic acid  1 mg Oral Daily  . heparin  5,000 Units Subcutaneous Q8H  . metoprolol succinate  25 mg Oral QHS  . multivitamin with minerals  1 tablet Oral Daily  . pantoprazole  40 mg Oral Daily  . predniSONE  20 mg Oral Q breakfast  . sodium chloride flush  3 mL Intravenous Q12H  . sodium chloride flush  3 mL Intravenous Q12H  . thiamine  100 mg Oral Daily  . Tiotropium Bromide Monohydrate  2 puff Inhalation Daily    Vital Signs    Vitals:   09/24/15 2237 09/25/15 0427 09/25/15 0714 09/25/15 0743  BP: (!) 180/101 115/60 110/60   Pulse: 64 75    Resp: 18 16    Temp: 98.2 F (36.8 C) 97.7 F (36.5 C)    TempSrc: Oral Oral    SpO2: 100% 100%  98%  Weight: 130 lb 9 oz (59.2 kg) 130 lb (59 kg)    Height: 5\' 9"  (1.753 m)       Intake/Output Summary (Last 24 hours) at 09/25/15 0810 Last data filed at 09/25/15 0800  Gross per 24 hour  Intake                0 ml  Output              900 ml  Net             -900 ml   Filed Weights   09/24/15 1535 09/24/15 2237  09/25/15 0427  Weight: 137 lb (62.1 kg) 130 lb 9 oz (59.2 kg) 130 lb (59 kg)    Physical Exam    GEN: Elderly Well nourished, well developed, in no acute distress.  HEENT: Grossly normal.  Neck: Supple, no JVD, carotid bruits, or masses. Cardiac: RRR, no murmurs, rubs, or gallops. No clubbing, cyanosis, edema.  Radials/DP/PT 2+ and equal bilaterally.  Respiratory:  Respirations regular and unlabored, clear to auscultation bilaterally. GI: Soft, nontender, nondistended, BS + x 4. MS: no deformity or atrophy. Skin: warm and dry, no rash. Neuro:  Strength and sensation are intact. Psych: AAOx3.  Normal affect.  Labs    CBC  Recent Labs  09/24/15 1541 09/24/15 2252  WBC 9.3 8.7  HGB 13.9 13.4  HCT 43.8 41.3  MCV 97.8 96.5  PLT 220 [id number]   Basic Metabolic Panel  Recent Labs  09/24/15 1541 09/24/15 2252 09/25/15 0433  NA 138  --  140  K 4.4  --  3.6  CL 102  --  104  CO2 28  --  29  GLUCOSE 150*  --  100*  BUN 12  --  10  CREATININE 1.07 1.05 0.95  CALCIUM 9.9  --  9.1  MG 2.0  --  2.0   Liver Function Tests No results for input(s): AST, ALT, ALKPHOS, BILITOT, PROT, ALBUMIN in the last 72 hours. No results for input(s): LIPASE, AMYLASE in the last 72 hours. Cardiac Enzymes  Recent Labs  09/24/15 2252 09/25/15 0433  TROPONINI <0.03 0.03*   BNP Invalid input(s): POCBNP D-Dimer No results for input(s): DDIMER in the last 72 hours. Hemoglobin A1C No results for input(s): HGBA1C in the last 72 hours. Fasting Lipid Panel No results for input(s): CHOL, HDL, LDLCALC, TRIG, CHOLHDL, LDLDIRECT in the last 72 hours. Thyroid Function Tests No results for input(s): TSH, T4TOTAL, T3FREE, THYROIDAB in the last 72 hours.  Invalid input(s): FREET3  Telemetry    No adverse arrhythmias, intermittent right bundle branch block noted, personally viewed  ECG    09/24/15-sinus tachycardia rate 115 with right bundle branch block morphology personally viewed  Radiology     Ct Abdomen Wo Contrast  Result Date: 09/24/2015 CLINICAL DATA:  Adrenal insufficiency. Right mid and abdominal pain. EXAM: CT ABDOMEN WITHOUT CONTRAST TECHNIQUE: Multidetector CT imaging of the abdomen was performed following the standard protocol without IV contrast. COMPARISON:  Heme anterior protocol CT 06/16/2015 FINDINGS: Lower chest: Emphysema.  Right basilar scarring.  No pleural fluid. Hepatobiliary: No evidence of focal hepatic lesion allowing for lack contrast. Gallbladder minimally distended, no calcified stone. No biliary dilatation. Pancreas: Grossly negative, suboptimally assessed without contrast and paucity of intra-abdominal fat. Spleen: Normal in size. Adrenals/Urinary Tract: No adrenal nodule allowing for motion. No adrenal thickening or hypertrophy. Nonobstructing stones in the right kidney largest in the lower pole measures 13 mm. Low-density left renal lesions on prior CT are not currently identified given lack of contrast. There is no hydronephrosis or perinephric edema. Stomach/Bowel: The stomach is decompressed. Air and stool throughout the colon were visualized. No small bowel dilatation were visualized. Vascular/Lymphatic: Aortic atherosclerosis without aneurysm. Atherosclerosis at the origin of both renal arteries. No evidence of adenopathy allowing for lack contrast and paucity of intra-abdominal fat. Other: No free fluid or free air. Musculoskeletal: Degenerative change in the visualized spine. IMPRESSION: 1. Normal CT appearance of the adrenal glands. No nodule or thickening. 2. Nonobstructing right nephrolithiasis.  No hydronephrosis. 3. Aortic atherosclerosis with atherosclerotic calcifications at the origin of the right and left renal artery. Electronically Signed   By: Jeb Levering M.D.   On: 09/24/2015 23:20   Dg Chest 2 View  Result Date: 09/24/2015 CLINICAL DATA:  82 year old male chest pain shortness of breath and abdominal pain. Recent hospitalization for similar  symptoms. Initial encounter. EXAM: CHEST  2 VIEW COMPARISON:  09/12/2015 and earlier FINDINGS: Stable large lung volumes. Sequelae of CABG. Stable cardiac size and mediastinal contours. No cardiomegaly. Calcified aortic atherosclerosis. No pneumothorax, pulmonary edema, pleural effusion or confluent pulmonary opacity. Osteopenia. No acute osseous abnormality identified. Negative visible bowel gas pattern. IMPRESSION: Chronic pulmonary hyperinflation and Calcified aortic atherosclerosis. No acute cardiopulmonary abnormality. Electronically Signed   By: Genevie Ann M.D.   On: 09/24/2015 16:07    Patient Profile     Echocardiogram 12/21/14 showed EF 50% with no wall motion abnormalities  Assessment & Plan    Chest pain  - Troponins have been normal. Reassuring.  -  Sometimes improves with rubbing chest. Difficult to describe. Intermittent chest pressure.  - ? COPD playing a role  - Nonetheless, he is feeling better today.  Orthostatic hypotension  - There have been concerns about potential adrenal insufficiency. Patient is seeking care from primary doctor regarding this.  - He was placed at last admission on prednisone 20 which did lead to improvement in blood pressure. Prednisone was reduced to 10 mg by PCP. He was never started on Florinef. He has been taking his hydralazine and Toprol-XL as directed with compression stockings but over-the-counter.  - Hydralazine was stopped during this hospitalization, using IV doses when necessary rated and [id number] systolic or diastolic greater than [id number].  - He is trying Norvasc 5 mg.  - Prednisone back up to 20 mg.  - Abdominal CT shows aortic atherosclerosis as well as atherosclerosis of the renal arteries bilaterally at the origins.  - Since he has been on chronic prednisone, Dr. Loanne Drilling stated that there was no utility in checking cosyntropin stim test according to previous discharge summary.  COPD  - Dr. [name], previously on prednisone, oxygen dependent  CAD  post CABG  - 2003  Carotid artery disease  -Stable  Anxiety  - Xanax  Let's watch again today and see how he does. He is under admission status because he is high risk for adverse events such as falls as we adjust his medications. Complex medical situation.  Signed, Candee Furbish, MD  09/25/2015, 8:10 AM

## 2015-09-25 NOTE — Progress Notes (Signed)
Offered assistance with bath. Pt stated he will take care of bath, but feels as he didn't need one as of now. Wife assisted Pt with applying fresh grown.

## 2015-09-26 DIAGNOSIS — I951 Orthostatic hypotension: Secondary | ICD-10-CM | POA: Diagnosis present

## 2015-09-26 MED ORDER — HYDRALAZINE HCL 25 MG PO TABS: 25.0000 mg | ORAL_TABLET | Freq: Two times a day (BID) | ORAL | Status: DC | PRN

## 2015-09-26 MED ORDER — PREDNISONE 20 MG PO TABS: 20.0000 mg | ORAL_TABLET | Freq: Every day | ORAL | 6 refills | Status: DC

## 2015-09-26 MED ORDER — AMLODIPINE BESYLATE 5 MG PO TABS: 5.0000 mg | ORAL_TABLET | Freq: Every day | ORAL | 6 refills | Status: DC

## 2015-09-26 NOTE — Progress Notes (Signed)
Patient Name: [name] Date of Encounter: 09/26/2015  Primary Cardiologist: Dr. Laurena Bering Problem List     Active Problems:   Angina at rest [facility])     Subjective   No significant chest pain. Has sensation of feeling worse with elevated blood pressure.Feels a hot flushed feeling when getting up to go to the bathroom for instance at home. He will take his blood pressure that time and it will be increased.  Yesterday however he had a good day. Felt comfortable with his blood pressures.  Previously described a growing discomfort and mid chest when sometimes pressing on chest this seemed to help. He felt cloudy with blood pressure 160/90.  In the office setting on 09/22/15 orthostatics were abnormal with blood pressure 170 laying, 132 standing.   Previous hospitalization discharge on 9/7  felt to be component of adrenal insufficiency.  Inpatient Medications    . alum & mag hydroxide-simeth  30 mL Oral Once  . amLODipine  5 mg Oral Daily  . aspirin EC  81 mg Oral Daily  . atorvastatin  20 mg Oral q1800  . budesonide-formoterol  2 puff Inhalation BID  . cholecalciferol  2,000 Units Oral Daily  . finasteride  5 mg Oral q morning - 10a  . fluticasone  2 spray Each Nare Daily  . folic acid  1 mg Oral Daily  . heparin  5,000 Units Subcutaneous Q8H  . metoprolol succinate  25 mg Oral QHS  . multivitamin with minerals  1 tablet Oral Daily  . pantoprazole  40 mg Oral Daily  . predniSONE  20 mg Oral Q breakfast  . sodium chloride flush  3 mL Intravenous Q12H  . sodium chloride flush  3 mL Intravenous Q12H  . thiamine  100 mg Oral Daily  . Tiotropium Bromide Monohydrate  2 puff Inhalation Daily    Vital Signs    Vitals:   09/25/15 1407 09/25/15 2028 09/25/15 2249 09/26/15 0449  BP: 139/67 (!) 143/73  (!) 117/47  Pulse: 83 81  65  Resp: 18 18  16   Temp: 98.2 F (36.8 C) 98.1 F (36.7 C)  97.9 F (36.6 C)  TempSrc: Oral Oral  Oral  SpO2: 100% 99% 98% 100%  Weight:     128 lb 14.4 oz (58.5 kg)  Height:        Intake/Output Summary (Last 24 hours) at 09/26/15 0818 Last data filed at 09/26/15 0450  Gross per 24 hour  Intake              660 ml  Output             1450 ml  Net             -790 ml   Filed Weights   09/24/15 2237 09/25/15 0427 09/26/15 0449  Weight: 130 lb 9 oz (59.2 kg) 130 lb (59 kg) 128 lb 14.4 oz (58.5 kg)    Physical Exam    GEN: Elderly Well nourished, well developed, in no acute distress.  HEENT: Grossly normal.  Neck: Supple, no JVD, carotid bruits, or masses. Cardiac: RRR, no murmurs, rubs, or gallops. No clubbing, cyanosis, edema.  Radials/DP/PT 2+ and equal bilaterally.  Respiratory:  Respirations regular and unlabored, clear to auscultation bilaterally. GI: Soft, nontender, nondistended, BS + x 4. MS: no deformity or atrophy. Skin: warm and dry, no rash. Neuro:  Strength and sensation are intact. Psych: AAOx3.  Normal affect.  Labs    CBC  Recent Labs  09/24/15 1541 09/24/15 2252  WBC 9.3 8.7  HGB 13.9 13.4  HCT 43.8 41.3  MCV 97.8 96.5  PLT 220 99991111   Basic Metabolic Panel  Recent Labs  09/24/15 1541 09/24/15 2252 09/25/15 0433  NA 138  --  140  K 4.4  --  3.6  CL 102  --  104  CO2 28  --  29  GLUCOSE 150*  --  100*  BUN 12  --  10  CREATININE 1.07 1.05 0.95  CALCIUM 9.9  --  9.1  MG 2.0  --  2.0   Liver Function Tests No results for input(s): AST, ALT, ALKPHOS, BILITOT, PROT, ALBUMIN in the last 72 hours. No results for input(s): LIPASE, AMYLASE in the last 72 hours. Cardiac Enzymes  Recent Labs  09/24/15 2252 09/25/15 0433 09/25/15 1027  TROPONINI <0.03 0.03* <0.03   BNP Invalid input(s): POCBNP D-Dimer No results for input(s): DDIMER in the last 72 hours. Hemoglobin A1C No results for input(s): HGBA1C in the last 72 hours. Fasting Lipid Panel No results for input(s): CHOL, HDL, LDLCALC, TRIG, CHOLHDL, LDLDIRECT in the last 72 hours. Thyroid Function Tests No results for  input(s): TSH, T4TOTAL, T3FREE, THYROIDAB in the last 72 hours.  Invalid input(s): FREET3  Telemetry    No adverse arrhythmias, intermittent right bundle branch block noted, personally viewed  ECG    09/24/15-sinus tachycardia rate 115 with right bundle branch block morphology personally viewed. Heart rate currently between 60-80  Radiology    Ct Abdomen Wo Contrast  Result Date: 09/24/2015 CLINICAL DATA:  Adrenal insufficiency. Right mid and abdominal pain. EXAM: CT ABDOMEN WITHOUT CONTRAST TECHNIQUE: Multidetector CT imaging of the abdomen was performed following the standard protocol without IV contrast. COMPARISON:  Heme anterior protocol CT 06/16/2015 FINDINGS: Lower chest: Emphysema.  Right basilar scarring.  No pleural fluid. Hepatobiliary: No evidence of focal hepatic lesion allowing for lack contrast. Gallbladder minimally distended, no calcified stone. No biliary dilatation. Pancreas: Grossly negative, suboptimally assessed without contrast and paucity of intra-abdominal fat. Spleen: Normal in size. Adrenals/Urinary Tract: No adrenal nodule allowing for motion. No adrenal thickening or hypertrophy. Nonobstructing stones in the right kidney largest in the lower pole measures 13 mm. Low-density left renal lesions on prior CT are not currently identified given lack of contrast. There is no hydronephrosis or perinephric edema. Stomach/Bowel: The stomach is decompressed. Air and stool throughout the colon were visualized. No small bowel dilatation were visualized. Vascular/Lymphatic: Aortic atherosclerosis without aneurysm. Atherosclerosis at the origin of both renal arteries. No evidence of adenopathy allowing for lack contrast and paucity of intra-abdominal fat. Other: No free fluid or free air. Musculoskeletal: Degenerative change in the visualized spine. IMPRESSION: 1. Normal CT appearance of the adrenal glands. No nodule or thickening. 2. Nonobstructing right nephrolithiasis.  No  hydronephrosis. 3. Aortic atherosclerosis with atherosclerotic calcifications at the origin of the right and left renal artery. Electronically Signed   By: Jeb Levering M.D.   On: 09/24/2015 23:20   Dg Chest 2 View  Result Date: 09/24/2015 CLINICAL DATA:  82 year old male chest pain shortness of breath and abdominal pain. Recent hospitalization for similar symptoms. Initial encounter. EXAM: CHEST  2 VIEW COMPARISON:  09/12/2015 and earlier FINDINGS: Stable large lung volumes. Sequelae of CABG. Stable cardiac size and mediastinal contours. No cardiomegaly. Calcified aortic atherosclerosis. No pneumothorax, pulmonary edema, pleural effusion or confluent pulmonary opacity. Osteopenia. No acute osseous abnormality identified. Negative visible bowel gas pattern. IMPRESSION: Chronic pulmonary hyperinflation and  Calcified aortic atherosclerosis. No acute cardiopulmonary abnormality. Electronically Signed   By: Genevie Ann M.D.   On: 09/24/2015 16:07    Patient Profile     Echocardiogram 12/21/14 showed EF 50% with no wall motion abnormalities  Assessment & Plan    Chest pain  - Troponins have been normal. Reassuring.  - Sometimes improves with rubbing chest. Difficult to describe. Intermittent chest pressure.  - ? COPD playing a role  - Nonetheless, he is feeling better today.  Orthostatic hypotension  - Challenging situation. He demonstrates significantly elevated blood pressures at 99991111 systolic for instance but then upon standing can severely decrease. Yesterday however he had a better day, smoother.  - There have been concerns about potential adrenal insufficiency. Patient is seeking care from primary doctor regarding this and will see Dr. Loanne Drilling of endocrinology later.  - He was placed at last admission on prednisone 20 which did lead to improvement in blood pressure. This has been resumed, increased back to 20 mg. Prednisone was reduced to 10 mg Recently as outpatient. He was never started on  Florinef. He has been taking his hydralazine and Toprol-XL as directed with compression stockings but over-the-counter.  - Hydralazine was stopped during this hospitalization, using IV doses when necessary rated and 123XX123 systolic or diastolic greater than A999333. At home, it would not be unreasonable if his blood pressures were in the 180 and above range to take one of his hydralazine tablets.  - He is trying Norvasc 5 mg. continue this for now.  - Prednisone back up to 20 mg.  - Abdominal CT shows aortic atherosclerosis as well as atherosclerosis of the renal arteries bilaterally at the origins.  - Since he has been on chronic prednisone, Dr. Loanne Drilling stated that there was no utility in checking cosyntropin stim test according to previous discharge summary.  COPD  - Dr. [name], previously on prednisone, oxygen dependent  CAD post CABG  - 2003  Carotid artery disease  -Stable  Anxiety  - Xanax   He is under admission status because he is high risk for adverse events such as falls as we adjust his medications. Complex medical situation.  His wife asked for a copy of a hip CT scan, perhaps abdominal CT scan on CD for doctors at [facility] as well as her son who is a Stage manager. Please help facilitate.  I'm comfortable with discharge today.  Signed, Candee Furbish, MD  09/26/2015, 8:18 AM

## 2015-09-26 NOTE — Discharge Summary (Signed)
Physician Discharge Summary       Patient ID: [name] MRN: [id number] DOB/AGE: 1929/08/27 82 y.o.  Admit date: 09/24/2015 Discharge date: 09/26/2015 Primary Cardiologist:Dr. Tamala Julian   Discharge Diagnoses:  Principal Problem:   Angina at rest [facility]) Active Problems:   Essential hypertension   COPD GOLD IV/ 02 dep /steroid dep   Chronic respiratory failure with hypoxia and hypercapnia (HCC)   CAD (coronary artery disease)   Chest pain   Orthostatic hypotension   Discharged Condition: good  Procedures: none  Hospital Course: 82 yr old male with hx of CAD with prior coronary bypass grafting x 5 in 2003 per [facility], COPD with cor pulmonale, hyperlipidemia, hypertension, PAD with carotid disease (followed by [facility]), alcohol abuse and history of carotid obstructive disease. Also recently with adrenal insuff. during a hospitalization.  To be seen by endocrinology as outpt.  His history includes oxygen-dependent COPD (on prednisone 5 mg daily previously per Dr. [name]), He has had fairly recent difficulties with significant orthostasis alternating with blood pressure elevations that are associated with a feeling of fullness in his head and intermittent chest "pressure." Chest pain not always described consistently, sometimes feels like more of a muscle tightness, improves when he rubs his left upper chest. During most recent hospital stay there were concerns about potential recurrent adrenal insufficiency and he was placed on prednisone 20 mg daily, continued on Toprol-XL, and also started on hydralazine. This regimen at least per discharge summary did lead to improvement in blood pressure, however patient states that ever since discharge he continues to feel completely fatigued, lightheaded when he stands, and reports intermittent chest pain as outlined. He did see his PCP following discharge, prednisone was reduced to 10 mg daily, he was never started on Florinef. Patient states that he has been  taking hydralazine and Toprol-XL as directed each day. Does not have therapeutic compression stockings, bought some over-the-counter.  On examination the ER blood pressure elevated in the 170s over 100s, heart rate in 80s to 90s in sinus rhythm with frequent PACs. Lab work shows potassium 4.4, creatinine 1.0, troponin I 0.01, hemoglobin 13.9. Initial ECG showed sinus tachycardia with complete right bundle-branch block which was progressed compared to the prior tracing, possible right atrial enlargement and diffuse nonspecific ST-T wave abnormalities.  Patient presented with complaints of chest pain having both typical and atypical features in the setting of uncontrolled hypertension and recent problems with orthostasis that were felt as of last admission to be related to adrenal insufficiency due to chronic low-dose steroids. He has felt symptomatically worse over the last week. Plan is admission to the hospital for further medication adjustments, cycle cardiac markers, but at this point not entirely clear what direction to take in terms of ischemic evaluation. He has not had any formal testing recently, and a Lexiscan Myoview might be reasonable to exclude any high degree of ischemic burden that could be contributing to an increased stress state. Would continue Toprol-XL, stop hydralazine (can use IV doses as needed if systolic blood pressure greater than 123XX123 and diastolic greater than A999333- but not needed this admit), try Norvasc at 5 mg daily, and increase prednisone dose back to 20 mg daily for now.   Cardiac markers are negative and and follow-up ECG on the 16th without change.  We obtained an abdominal CT, he has actually already been scheduled for a renal artery duplex based on his most recent outpatient visit. He needs to have therapeutic compression stockings that are measured and  fitted- we were not able to arrange this in the hospital- will do as outpt.    With medication changes pt felt better.   Orthostatic BPs on 09/25/15: Lying 158/74  P 72 Sitting 157/89  P 82 Standing  132/73  P 91 Standing at 3 min  99/65 P 89  BP not spiking in the AM as it was.  Will continue on current meds.  Pt seen and evaluated by Dr. Marlou Porch and found stable for discharge.  We obtained CD of CT of abd per wife's request for son who is radiologist and for [place].  Abdominal CT shows aortic atherosclerosis as well as atherosclerosis of the renal arteries bilaterally at the origins. Pt is for renal dopplers.   Neg MI, unsure if chest discomfort is from HTN, GI or COPD.  He are discharging on 20 mg of prednisone.  No florinef for now.  Since he has been on chronic prednisone, Dr. Loanne Drilling stated that there was no utility in checking cosyntropin stim test according to previous discharge summary  Consults: None  Significant Diagnostic Studies:  BMP Latest Ref Rng & Units 09/25/2015 09/24/2015 09/24/2015  Glucose 65 - 99 mg/dL 100(H) - 150(H)  BUN 6 - 20 mg/dL 10 - 12  Creatinine 0.61 - 1.24 mg/dL 0.95 1.05 1.07  Sodium 135 - 145 mmol/L 140 - 138  Potassium 3.5 - 5.1 mmol/L 3.6 - 4.4  Chloride 101 - 111 mmol/L 104 - 102  CO2 22 - 32 mmol/L 29 - 28  Calcium 8.9 - 10.3 mg/dL 9.1 - 9.9   CBC Latest Ref Rng & Units 09/24/2015 09/24/2015 09/12/2015  WBC 4.0 - 10.5 K/uL 8.7 9.3 7.6  Hemoglobin 13.0 - 17.0 g/dL 13.4 13.9 13.5  Hematocrit 39.0 - 52.0 % 41.3 43.8 40.6  Platelets 150 - 400 K/uL 205 220 198    Troponin 0.01; <0.03; 0.03; <0.03  CHEST  2 VIEW COMPARISON:  09/12/2015 and earlier FINDINGS: Stable large lung volumes. Sequelae of CABG. Stable cardiac size and mediastinal contours. No cardiomegaly. Calcified aortic atherosclerosis. No pneumothorax, pulmonary edema, pleural effusion or confluent pulmonary opacity. Osteopenia. No acute osseous abnormality identified. Negative visible bowel gas pattern. IMPRESSION: Chronic pulmonary hyperinflation and Calcified aortic atherosclerosis. No acute  cardiopulmonary abnormality.   CT ABDOMEN WITHOUT CONTRAST  TECHNIQUE: Multidetector CT imaging of the abdomen was performed following the standard protocol without IV contrast.  COMPARISON:  Heme anterior protocol CT 06/16/2015  FINDINGS: Lower chest: Emphysema.  Right basilar scarring.  No pleural fluid.  Hepatobiliary: No evidence of focal hepatic lesion allowing for lack contrast. Gallbladder minimally distended, no calcified stone. No biliary dilatation.  Pancreas: Grossly negative, suboptimally assessed without contrast and paucity of intra-abdominal fat.  Spleen: Normal in size.  Adrenals/Urinary Tract: No adrenal nodule allowing for motion. No adrenal thickening or hypertrophy. Nonobstructing stones in the right kidney largest in the lower pole measures 13 mm. Low-density left renal lesions on prior CT are not currently identified given lack of contrast. There is no hydronephrosis or perinephric edema.  Stomach/Bowel: The stomach is decompressed. Air and stool throughout the colon were visualized. No small bowel dilatation were visualized.  Vascular/Lymphatic: Aortic atherosclerosis without aneurysm. Atherosclerosis at the origin of both renal arteries. No evidence of adenopathy allowing for lack contrast and paucity of intra-abdominal fat.  Other: No free fluid or free air.  Musculoskeletal: Degenerative change in the visualized spine.  IMPRESSION: 1. Normal CT appearance of the adrenal glands. No nodule or thickening. 2. Nonobstructing right nephrolithiasis.  No hydronephrosis. 3. Aortic atherosclerosis with atherosclerotic calcifications at the origin of the right and left renal artery.   Discharge Exam: Blood pressure (!) 117/47, pulse 74, temperature 97.9 F (36.6 C), temperature source Oral, resp. rate 18, height 5\' 9"  (1.753 m), weight 128 lb 14.4 oz (58.5 kg), SpO2 96 %.    Disposition: 01-Home or Self Care     Medication List      STOP taking these medications   furosemide 20 MG tablet Commonly known as:  LASIX     TAKE these medications   ALPRAZolam 0.25 MG tablet Commonly known as:  XANAX Take 0.25 mg by mouth 2 (two) times daily as needed for anxiety.   amLODipine 5 MG tablet Commonly known as:  NORVASC Take 1 tablet (5 mg total) by mouth daily. Start taking on:  09/27/2015   aspirin 81 MG tablet Take 81 mg by mouth daily.   atorvastatin 20 MG tablet Commonly known as:  LIPITOR Take 20 mg by mouth daily at 6 PM.   budesonide 0.5 MG/2ML nebulizer solution Commonly known as:  PULMICORT Take 2 mLs (0.5 mg total) by nebulization 2 (two) times daily.   finasteride 5 MG tablet Commonly known as:  PROSCAR Take 5 mg by mouth every morning.   fluticasone 50 MCG/ACT nasal spray Commonly known as:  FLONASE Place 2 sprays into both nostrils daily.   folic acid 1 MG tablet Commonly known as:  FOLVITE Take 1 tablet (1 mg total) by mouth daily.   hydrALAZINE 25 MG tablet Commonly known as:  APRESOLINE Take 1 tablet (25 mg total) by mouth every 12 (twelve) hours as needed (for BP [id number] systolic or > 123XX123 diastolic). If your BP is 99991111 systolic you may take one dose of the hydralazine that you have at home.  But would not do this more than twice a day. What changed:  when to take this  reasons to take this  additional instructions   loratadine 10 MG tablet Commonly known as:  CLARITIN Take 10 mg by mouth daily as needed (drippy nose and drainage). Reported on 05/24/2015   metoprolol succinate 25 MG 24 hr tablet Commonly known as:  TOPROL XL TAKE 1 TABLET(25 MG) BY MOUTH DAILY AT NIGHT   multivitamin tablet Take 1 tablet by mouth daily.   NITROSTAT 0.4 MG SL tablet Generic drug:  nitroGLYCERIN DISSOLVE 1 TABLET UNDER THE TONGUE AS DIRECTED AND AS NEEDED FOR CHEST PAIN   omeprazole 40 MG capsule Commonly known as:  PRILOSEC TAKE 1 CAPSULE(40 MG) BY MOUTH TWICE DAILY BEFORE A MEAL    OXYGEN Take 2 L by mouth daily as needed. 2 l/m at bedtime and as needed for shortness of breath   predniSONE 20 MG tablet Commonly known as:  DELTASONE Take 1 tablet (20 mg total) by mouth daily with breakfast. Start taking on:  09/27/2015 What changed:  medication strength  how much to take   SPIRIVA RESPIMAT 2.5 MCG/ACT Aers Generic drug:  Tiotropium Bromide Monohydrate Inhale 2 puffs into the lungs daily.   SYMBICORT 160-4.5 MCG/ACT inhaler Generic drug:  budesonide-formoterol INHALE 2 PUFFS BY MOUTH EVERY MORNING AND THEN ANOTHER 2 PUFFS ABOUT 12 HOURS LATER   thiamine 100 MG tablet Take 1 tablet (100 mg total) by mouth daily.   Vitamin D3 2000 units Tabs Take 2,000 Units by mouth daily.   XOPENEX HFA 45 MCG/ACT inhaler Generic drug:  levalbuterol INHALE 2 PUFFS BY MOUTH EVERY 4 HOURS AS NEEDED FOR WHEEZING  OR SHORTNESS OF BREATH      Follow-up Information    Sinclair Grooms, MD Follow up on 09/28/2015.   Specialty:  Cardiology Why:  at 9:30 AM  please keep this  Contact information: [address] [address] [phone]            Discharge Instructions: Call the office if any questions.  We added amlodipine (Norvasc) and stopped the hydralazine.  IF your BP is greater than 99991111 systolic you may take one of your 25 mg of hydralazine up to every 12 hours.   But only if needed.  We increased the prednisone back to 20 mg daily.    Keep appointment with Dr. Tamala Julian Tuesday to evaluate your BP and if any issues.   Heart Healthy diet.  Keep appointment with Endocrinology  And for renal dopplers tomorrow if possible.     Signed: Cecilie Kicks Nurse Practitioner-Certified [facility]: [facility] 09/26/2015, 12:01 PM  Time spent on discharge : > 30 minutes.    Personally seen and examined. Agree with above.  82 year old male with markedly labile blood pressures, orthostatic hypotension, presumed adrenal  insufficiency from long-standing prednisone use secondary to COPD admitted with chest discomfort, strange sensation in the setting of hypertension.  Hydralazine was discontinued. Low-dose amlodipine was started. Prednisone was increased from 10 mg back up to 20 mg as was prescribed during previous hospitalization. No Florinef was added.  He does have an appointment upcoming with Dr. Loanne Drilling of endocrinology.  There is calcification/atherosclerosis noted at the origin of the renal arteries.  No edema, lungs are clear, heart without ectopy.  Explained to him autonomic dysfunction. Continue with compression hose. His condition can be challenging to control. Reassurance has been given with regards to troponin, EKG etc.  Candee Furbish, MD

## 2015-09-26 NOTE — Discharge Instructions (Signed)
Call the office if any questions.  We added amlodipine (Norvasc) and stopped the hydralazine.  IF your BP is greater than 99991111 systolic you may take one of your 25 mg of hydralazine up to every 12 hours.   But only if needed.  We increased the prednisone back to 20 mg daily.    Keep appointment with Dr. Tamala Julian Tuesday to evaluate your BP and if any issues.   Heart Healthy diet.   Keep appointment with Endocrinology  And for renal dopplers tomorrow if possible.

## 2015-09-26 NOTE — Care Management Note (Signed)
Case Management Note  Patient Details  Name: [name] MRN: [id number] Date of Birth: September 04, 1929  Subjective/Objective:                  Angina at rest [facility]); COPD  Action/Plan: CM spoke to patient and spouse at the bedside and they both deny any HH or DME needs. CM did refer to [facility] -  and explained program to patient and to anticipate call after discharge. They are planning to move into a once story home to prepare for possible future needs and changes with mobility. Patient has [place] oxygen already with advanced home care and has a portable tank with him. His spouse will be taking him home today. No further CM needs communicated at this time.  Expected Discharge Date:  09/26/15              Expected Discharge Plan:  Home/Self Care  In-House Referral:     Discharge planning Services  CM Consult  Post Acute Care Choice:    Choice offered to:  Patient  DME Arranged:    DME Agency:     HH Arranged:    [place] Agency:     Status of Service:  Completed, signed off  If discussed at [employer] of Stay Meetings, dates discussed:    Additional Comments:  Guido Sander, RN 09/26/2015, 12:38 PM

## 2015-09-27 ENCOUNTER — Ambulatory Visit (HOSPITAL_COMMUNITY)
Admission: RE | Admit: 2015-09-27 | Discharge: 2015-09-27 | Disposition: A | Discharge status: Home / Self Care | Payer: Medicare Other | Source: Ambulatory Visit | Attending: Nurse Practitioner | Admitting: Nurse Practitioner

## 2015-09-27 DIAGNOSIS — I10 Essential (primary) hypertension: Secondary | ICD-10-CM | POA: Insufficient documentation

## 2015-09-27 DIAGNOSIS — Z87891 Personal history of nicotine dependence: Secondary | ICD-10-CM | POA: Insufficient documentation

## 2015-09-27 DIAGNOSIS — I2510 Atherosclerotic heart disease of native coronary artery without angina pectoris: Secondary | ICD-10-CM | POA: Insufficient documentation

## 2015-09-27 DIAGNOSIS — I700 Atherosclerosis of aorta: Secondary | ICD-10-CM | POA: Diagnosis not present

## 2015-09-27 DIAGNOSIS — I739 Peripheral vascular disease, unspecified: Secondary | ICD-10-CM | POA: Diagnosis not present

## 2015-09-27 DIAGNOSIS — E785 Hyperlipidemia, unspecified: Secondary | ICD-10-CM | POA: Insufficient documentation

## 2015-09-28 ENCOUNTER — Ambulatory Visit (INDEPENDENT_AMBULATORY_CARE_PROVIDER_SITE_OTHER): Payer: Medicare Other | Admitting: Interventional Cardiology

## 2015-09-28 ENCOUNTER — Other Ambulatory Visit: Payer: Self-pay | Admitting: *Deleted

## 2015-09-28 ENCOUNTER — Encounter: Payer: Self-pay | Admitting: Interventional Cardiology

## 2015-09-28 VITALS — BP 122/68 | HR 78 | Ht 69.0 in | Wt 135.2 lb

## 2015-09-28 DIAGNOSIS — I951 Orthostatic hypotension: Secondary | ICD-10-CM

## 2015-09-28 DIAGNOSIS — J449 Chronic obstructive pulmonary disease, unspecified: Secondary | ICD-10-CM

## 2015-09-28 DIAGNOSIS — I10 Essential (primary) hypertension: Secondary | ICD-10-CM | POA: Diagnosis not present

## 2015-09-28 DIAGNOSIS — E785 Hyperlipidemia, unspecified: Secondary | ICD-10-CM

## 2015-09-28 DIAGNOSIS — I25118 Atherosclerotic heart disease of native coronary artery with other forms of angina pectoris: Secondary | ICD-10-CM

## 2015-09-28 DIAGNOSIS — I779 Disorder of arteries and arterioles, unspecified: Secondary | ICD-10-CM

## 2015-09-28 NOTE — Progress Notes (Signed)
Cardiology Office Note    Date:  09/28/2015   ID:  [name], DOB 1929/08/01, MRN [id number]  PCP:   Melinda Crutch, MD  Cardiologist: Sinclair Grooms, MD   Chief Complaint  Patient presents with  . Coronary Artery Disease    History of Present Illness:  [name] is a 82 y.o. male who presents for CAD with prior coronary bypass grafting, COPD with cor pulmonale, hyperlipidemia, hypertension, PAD, and history of carotid obstructive disease  Drug induced adrenal insufficiency. Responding to therapy. Has support stockings. Will see endocrine soon. Despite spells of severe orthostasis, no cardiac events have occurred. He has chronic SOB. There is no orthopnea.   Past Medical History:  Diagnosis Date  . Asthma   . BPH (benign prostatic hyperplasia)    severe; s/p multiple biopsies  . CAD (coronary artery disease)   . Carotid artery occlusion   . Chronic rhinitis   . COPD (chronic obstructive pulmonary disease) (HCC)    2L [place] O2  . Hyperlipidemia   . Hypertension   . Peripheral vascular disease ([place])   . 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 Syncope and collapse   . Ureteral tumor 08/2015   had endoscopic procedure for evaluation, unable to reach for biopsy    Past Surgical History:  Procedure Laterality Date  . CATARACT EXTRACTION  11-20-11  . CORONARY ARTERY BYPASS GRAFT  2003  . LITHOTRIPSY    . PROSTATE BIOPSY  Oct. 2014    Current Medications: Outpatient Medications Prior to Visit  Medication Sig Dispense Refill  . ALPRAZolam (XANAX) 0.25 MG tablet Take 0.25 mg by mouth 2 (two) times daily as needed for anxiety.    Marland Kitchen amLODipine (NORVASC) 5 MG tablet Take 1 tablet (5 mg total) by mouth daily. 30 tablet 6  . aspirin 81 MG tablet Take 81 mg by mouth daily.    Marland Kitchen atorvastatin (LIPITOR) 20 MG tablet Take 20 mg by mouth daily at 6 PM.    . Cholecalciferol (VITAMIN D3) 2000 units TABS Take 2,000 Units by mouth daily.    . finasteride (PROSCAR) 5 MG tablet Take 5 mg by mouth  every morning.    . fluticasone (FLONASE) 50 MCG/ACT nasal spray Place 2 sprays into both nostrils daily.    . folic acid (FOLVITE) 1 MG tablet Take 1 tablet (1 mg total) by mouth daily. 30 tablet 0  . hydrALAZINE (APRESOLINE) 25 MG tablet Take 1 tablet (25 mg total) by mouth every 12 (twelve) hours as needed (for BP [id number] systolic or > 123XX123 diastolic). If your BP is 99991111 systolic you may take one dose of the hydralazine that you have at home.  But would not do this more than twice a day.    . metoprolol succinate (TOPROL XL) 25 MG 24 hr tablet TAKE 1 TABLET(25 MG) BY MOUTH DAILY AT NIGHT 30 tablet 10  . Multiple Vitamin (MULTIVITAMIN) tablet Take 1 tablet by mouth daily.    Marland Kitchen NITROSTAT 0.4 MG SL tablet DISSOLVE 1 TABLET UNDER THE TONGUE AS DIRECTED AND AS NEEDED FOR CHEST PAIN 25 tablet 5  . omeprazole (PRILOSEC) 40 MG capsule TAKE 1 CAPSULE(40 MG) BY MOUTH TWICE DAILY BEFORE A MEAL 60 capsule 5  . OXYGEN Take 2 L by mouth daily as needed. 2 l/m at bedtime and as needed for shortness of breath    . predniSONE (DELTASONE) 20 MG tablet Take 1 tablet (20 mg total) by mouth daily with breakfast.  30 tablet 6  . SYMBICORT 160-4.5 MCG/ACT inhaler INHALE 2 PUFFS BY MOUTH EVERY MORNING AND THEN ANOTHER 2 PUFFS ABOUT 12 HOURS LATER 10.2 g 5  . thiamine 100 MG tablet Take 1 tablet (100 mg total) by mouth daily. 30 tablet 0  . Tiotropium Bromide Monohydrate (SPIRIVA RESPIMAT) 2.5 MCG/ACT AERS Inhale 2 puffs into the lungs daily.    [name] HFA 45 MCG/ACT inhaler INHALE 2 PUFFS BY MOUTH EVERY 4 HOURS AS NEEDED FOR WHEEZING OR SHORTNESS OF BREATH 15 g 2  . budesonide (PULMICORT) 0.5 MG/2ML nebulizer solution Take 2 mLs (0.5 mg total) by nebulization 2 (two) times daily. (Patient not taking: Reported on 09/28/2015) 60 mL 3  . loratadine (CLARITIN) 10 MG tablet Take 10 mg by mouth daily as needed (drippy nose and drainage). Reported on 05/24/2015     No facility-administered medications prior to visit.       Allergies:   Sulfonamide derivatives; Cefdinir; Ciprofloxacin; Nitrofurantoin; and Levaquin [levofloxacin]   Social History   Social History  . Marital status: Married    Spouse name: N/A  . Number of children: 2  . Years of education: N/A   Occupational History  . 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 Smoking status: Former Smoker    Packs/day: 2.00    Years: 33.00    Types: Cigarettes    Quit date: 01/09/1982  . Smokeless tobacco: Never Used  . Alcohol use 0.0 oz/week     Comment: 1.5-3 oz daily in the past, minimal use in the last few months  . Drug use: No  . Sexual activity: Not Asked   Other Topics Concern  . None   Social History Narrative   Pt lives at home with his spouse.           Family History:  The patient's family history includes Allergies in his brother; Breast cancer in his mother; Cancer in his mother; Heart attack in his father; Heart disease in his brother, brother, father, and mother; Hyperlipidemia in his brother; Hypertension in his brother and mother; Other in his mother; Peripheral vascular disease in his brother; Stroke in his brother.   ROS:   Please see the history of present illness.    No edema. Concerned about left kidney/ureteral mass that has not been characterized, No blood in urine or stool.  All other systems reviewed and are negative.   PHYSICAL EXAM:   VS:  BP 122/68   Pulse 78   Ht 5\' 9"  (1.753 m)   Wt 135 lb 3.2 oz (61.3 kg)   BMI 19.97 kg/m    GEN: Well nourished, well developed, in no acute distress  HEENT: normal  Neck: no JVD, carotid bruits, or masses Cardiac: RRR; no murmurs, rubs, or gallops,no edema  Respiratory:  clear to auscultation bilaterally, normal work of breathing GI: soft, nontender, nondistended, + BS MS: no deformity or atrophy  Skin: warm and dry, no rash Neuro:  Alert and Oriented x 3, Strength and sensation are intact Psych: euthymic mood,  full affect  Wt Readings from Last 3 Encounters:  09/28/15 135 lb 3.2 oz (61.3 kg)  09/26/15 128 lb 14.4 oz (58.5 kg)  09/22/15 137 lb 1.9 oz (62.2 kg)      Studies/Labs Reviewed:   EKG:  EKG  Not repeated  Recent Labs: 04/15/2015: Pro B Natriuretic peptide (BNP) 71.0 09/12/2015: ALT 18 09/13/2015: TSH 3.067 09/24/2015: Hemoglobin 13.4; Platelets  205 09/25/2015: BUN 10; Creatinine, Ser 0.95; Magnesium 2.0; Potassium 3.6; Sodium 140   Lipid Panel    Component Value Date/Time   CHOL 135 09/13/2015 0320   TRIG 45 09/13/2015 0320   HDL 74 09/13/2015 0320   CHOLHDL 1.8 09/13/2015 0320   VLDL 9 09/13/2015 0320   LDLCALC 52 09/13/2015 0320    Additional studies/ records that were reviewed today include:  No new data. Spent considerable time reviewing recent hospital stay, findings and recommendations.     ASSESSMENT:    1. Essential hypertension   2. Coronary artery disease involving native coronary artery with other forms of angina pectoris ([place])   3. Peripheral vascular disease ([place])   4. Orthostatic hypotension   5. Acute cor pulmonale (HCC)   6. Hyperlipidemia   7. COPD GOLD IV/ 02 dep /steroid dep      PLAN:  In order of problems listed above:  1. Has had difficulty with orthostasis after developing adrenal insufficiency due to exogenous corticosteroid use. He will be seeing endocrinology. 2. Stable without new complaints. 3. Support stockings and added salt diet. 4. Not addressed. 5. Stable. Continue chronic O2 therapy    Medication Adjustments/Labs and Tests Ordered: Current medicines are reviewed at length with the patient today.  Concerns regarding medicines are outlined above.  Medication changes, Labs and Tests ordered today are listed in the Patient Instructions below. There are no Patient Instructions on file for this visit.   Signed, Sinclair Grooms, MD  09/28/2015 9:56 AM    [facility] [address], [place], [place]   [id number] Phone: [phone]; Fax: [phone]

## 2015-09-28 NOTE — Patient Instructions (Signed)
Medication Instructions:  None  Labwork: None  Testing/Procedures: None  Follow-Up: Your physician wants you to follow-up in: 6 months with Dr. Tamala Julian. You will receive a reminder letter in the mail two months in advance. If you don't receive a letter, please call our office to schedule the follow-up appointment.   Any Other Special Instructions Will Be Listed Below (If Applicable).  .Your physician would like for you to wear support stockings as discussed at your appointment today.  Please call our of office if your systolic blood pressure is less than or equal to 100 when standing.   If you need a refill on your cardiac medications before your next appointment, please call your pharmacy.

## 2015-09-28 NOTE — Patient Outreach (Signed)
[name] [facility]) Care Management  09/28/2015  [name] [birth year] [id number]   Referral received from inpatient case manager, Marcy Panning, for additional outpatient care management services.  Member was admitted on 9/15 for unstable angina, discharged 9/17.  According to chart, member also has history of hypertension, coronary artery disease, COPD with home O2, peripheral vascular disease, hyperlipidemia, and BPH.  Call placed to member to initiate transition of care program.  Member state that this is not a good time to talk as he is preparing for a follow up with his cardiologist.  He state that he will return call.  Will await call back, if no call back, will make another attempt to contact within the next 2 days.  Valente Kani, [place], MSN [place] [phone]

## 2015-09-29 DIAGNOSIS — R310 Gross hematuria: Secondary | ICD-10-CM | POA: Diagnosis not present

## 2015-09-29 DIAGNOSIS — C669 Malignant neoplasm of unspecified ureter: Secondary | ICD-10-CM | POA: Diagnosis not present

## 2015-09-30 ENCOUNTER — Other Ambulatory Visit: Payer: Self-pay | Admitting: *Deleted

## 2015-09-30 ENCOUNTER — Other Ambulatory Visit: Payer: Self-pay | Admitting: Internal Medicine

## 2015-09-30 ENCOUNTER — Telehealth: Payer: Self-pay

## 2015-09-30 ENCOUNTER — Telehealth: Payer: Self-pay | Admitting: Interventional Cardiology

## 2015-09-30 NOTE — Telephone Encounter (Signed)
New Message  Pt call requesting to speak with RN. Pt states he spoke with Dr. Tamala Julian about what med he should take when bp increasing. Pt states he would like a call back to see what he would need to take if bp got to low. Pt states at 12 noon today 9/21 he stood up to check his bp and it was 86/55. Please call back to discuss

## 2015-09-30 NOTE — Telephone Encounter (Signed)
Per 09/09/15 OV w/ Dr. Tennis Must Dios: 4. Currently he is on Symbicort, 160/4.5, 2 puffs twice a day and Spiriva respimat. I suggested maybe try Pulmicort nebulizer 0.5 twice a day and DuoNeb 4 times a day and see if the breathing gets better with that. I will need to touch base with Dr. [name] regarding this ---  Called spoke with pt. He reports he has not started the neb medications just yet. He is very confused on what he is suppose to take and when to take this. Pt is scheduled to come in and see MW on Monday (per pt request) to discuss his medications. Pt is aware to bring all medications in hand with him. Nothing further needed

## 2015-09-30 NOTE — Telephone Encounter (Signed)
Returned pt call. Spoke with pt and gave him Dr.Smith's recommendation. When his BP is low he should remain sited, or try lying down, he should also push fluids.  Adv pt to let us know if no improvement.  Pt verbalized understanding and voiced appreciation for the call back.

## 2015-09-30 NOTE — Patient Outreach (Signed)
[facility]) Care Management  09/30/2015  BONHAM MASSARO [birth year] [id number]   Call placed member again to initiate transition of care program.  Identity verified, this care manager introduces self and purpose of call.  Member state that he feels he is "doing ok" independently, but prefer his wife make final decision.  He places wife on phone, she report that the member has been doing "fine" since his discharge, however she is concerned about his orthostatic hypotension.  He denies chest pain/discomfort at this time.  She state that they have received parameters for medication if his blood pressure is elevated, but not if he is experiencing hypotension.  She state that the member's blood pressure today was 80s/60s and that he has been diagnosed with adrenal insufficiency.  She report that she has placed a call to his cardiologist for further instructions, waiting for a call back.  Advised to have member rest as much as possible, drink fluids, and use cane for stabilization when mobile.  Member has follow up appointments next week with endocrinologist and pulmonologist.  Follow up with cardiologist already complete.  Patient was recently discharged from hospital and all medications have been reviewed.    Wife request not to have a home visit at this time, but does agree to weekly transition of care calls.  She discusses interest in finding assisted/senior living as their home has multiple stairs and she is concerned with their ability to remain in the current home.  She is made aware that social worker referral has been placed.   Will follow up next week with weekly transition of care calls.  Valente Tyree, [place], MSN [place] [phone]

## 2015-10-04 ENCOUNTER — Encounter: Payer: Self-pay | Admitting: Internal Medicine

## 2015-10-04 ENCOUNTER — Ambulatory Visit (INDEPENDENT_AMBULATORY_CARE_PROVIDER_SITE_OTHER): Payer: Medicare Other | Admitting: Internal Medicine

## 2015-10-04 ENCOUNTER — Encounter: Payer: Self-pay | Admitting: *Deleted

## 2015-10-04 VITALS — BP 144/84 | HR 77 | Ht 69.0 in | Wt 136.2 lb

## 2015-10-04 DIAGNOSIS — J449 Chronic obstructive pulmonary disease, unspecified: Secondary | ICD-10-CM

## 2015-10-04 DIAGNOSIS — I779 Disorder of arteries and arterioles, unspecified: Secondary | ICD-10-CM

## 2015-10-04 NOTE — Progress Notes (Signed)
Subjective:    Patient ID: [name], male    DOB: [birth year]   MRN: [id number]     Summary: Pulmonary/ f/u ov try astepro for pnds   Primary Provider/Referring Provider: Maylon [name]    Brief patient profile:  7  yowm last smoked 1983 with GOLD III COPD with some reversibility and 02 dep at hs and with activity    History of Present Illness   05/01/2011 f/u ov/[name] cc 50 ft outside, one aisle at [facility], whole store with 02. No cough and minimal need for saba Seen at [facility] rec change ipatropium neb  to atrovent > no change doe. rec ry tudorza one puff twice daily and stop atrovent and see how it effects your activity performance, if not benefit ok to change back to atrovent   10/04/2011 f/u ov/[name] did not bring med calendar cc  Loosing ground on tudorza so went to atrovent neb and seemed to help. rec Plan A = use automatically no matter what Symbicort followed by Atrovent each am and symbiocort again in 12 hours plus continue the atrovent though the day in between about every 4-6 hours Only use your albuterol (xopenex hfa -puffer-  is Plan B,  Nebulizer is plan C) .      12/18/2012 f/u ov/[name] re: copd/ 02  at bedtime and with exertion  Chief Complaint  Patient presents with  . Follow-up    Increased sob at all times x1 month.  using O2 more frequently   actually no sob at rest or at hs, wears 02 with activity and when he does wear it the sats are fine even when he has to stop due to sob rec Plan A= automatic  symbiocort and tudorza twice daily and ok to use your 02 more consistently with activity that you know makes you short of breath Plan B = backup > xopenex 2 every 4 if needed if can't catch your breath Plan C = xopenex neb every 4 hours if plan B not working, always try plan B first  Please see patient coordinator before you leave today  to schedule rehab  02/14/2013 f/u ov/[name] re:  Chief Complaint  Patient presents with  . Follow-up    Wants to make sure his medications  are correct.  Pt c/o increased SOB with exertion.  easily confused with details of care, only using hfa xopenex not the neb xopenex, times right before he goes to rehab and feels he's making slow progress.  >>changed Symbicort to [facility] and atrovent neb to spiriva respimat     04/25/2013 f/u ov/[name] re:  Chief Complaint  Patient presents with  . Acute Visit    Pt c/o increased DOE for the past 3 wks- worse for the past 4-5 days. He states that he is SOB with any exertion at all such as just raising up his arms. He also c/o nasal congestion for the past 3 months. He is using xopenex HFA at least twice per day.    incxrease doe assoc with prod of more mucus and nasal congestion  but better overnight and worse as day goes on, occ green mucus but mostly white No desats walking on 02 at rehab 2lpm rec Prednisone 10 mg take  4 each am x 2 days,   2 each am x 2 days,  1 each am x 2 days and stop  Follow med calendar    06/19/2013 f/u ov/[name] re: GOLD III COPD with some reversibility on dulera 200 2bid and  spriva respimat  Chief Complaint  Patient presents with  . Follow-up    Pt c/o increased DOE for the past month. He states he gets SOB with walking approx 50 ft.    using xopenex qid never neb Did get def transient  benefit from prednisone  >steroid challenge   07/17/2013 Follow up and Med review  Returns for follow up and med review . We reviewed  All his meds and organized them into a med calendar w/ pt education.  Last visit was started on prednisone 20 mg with taper down to 10 mg and hold. Patient says that his breathing has improved. We discussed steroid use, and he wishes to taper off of this due to potential side effects.  rec Follow med calendar closely and bring to each visit.  Decrease prednisone 10mg  1/2 daily for 1 week then 1/2 every other day for 1 week and then stop.  May use saline nasal spray and gel As needed  For congestion .    08/27/2013 f/u ov/[name] re: copd/ 02 dep  with sleep/ ex / on symbicort and spiriva  Chief Complaint  Patient presents with  . Follow-up    ROV : Pt c/o incr SOB and states that he has had a recent decrease in activity--does not feel like his stamina/endurance is where it needs to be.   Variable doe  seemed some better on prednisone - never sob at rest or sleeping  Wears 02 when sleeps at 2 and exercises at 2lpm on treadmill at 1.2 mph level, goes 5 min rest 1 min Has not tried pre treating with xopenex before he does the treadmill, never tried slow setting, never uses neb xopenex rec Try using your xopenex hfa  before planned walk on your treadmill to see what difference if any this makes Prevnar 13 given today Resume prednisone 10 mg x 2 daily when xopenx is not lasting 4 hours, then 1 daily x 5 days and stop      10/23/2013 f/u ov/[name] re: copd/02 dep on symb/spiriva/ xopenex prn plus back up pred short course Chief Complaint  Patient presents with  . Follow-up    SOB   feels prednisone helped a lot with sob and less need for saba Able do treadmill x 15 min x 1 mph no grade no  wheeze overt sinus or hb symptoms.   rec Try using your xopenex hfa  before planned walk on your treadmill to see what difference if any this makes prevnar 13 given today Resume prednisone 10 mg x 2 daily when xopenx is not lasting 4 hours, then 1 daily x 5 days and stop   02/26/2014 f/u ov/[name] re: GOLD III/ confused with action plan portion of med calendar  Chief Complaint  Patient presents with  . Follow-up    Pt c/o increased cough and SOB for the past 3 wks. His cough is worse in the am and is prod with white to light green sputum. He is using xopenex inhaler about 3-4 x per day and has not used neb.    treadmill x 15 min s stopping  x 1.2 mph flat/ on 02 2lpm with sats no lower than 93  Cough is day > noct, not flaring in am, no purulent sputum rec Goal is to keep sats over 90% with activity     06/01/2014 f/u ov/[name] re: COPD GOLD III/  symb/ spiriva respimat prednsione ? Dose / did not bring med calendar Chief Complaint  Patient presents  with  . Follow-up    Pt states his breathing is progressively worse since the last visit.    increased to 37 m on 02  1.2 mph, no tilt s stopping p 10 min to rest on 2lpm with adequate sats reported    some days  Prednisone 10 mg tapered but confused with instructions Drippy nose no better with flonase / clariton > has not tried 1st gen H1  Not much change using saba before ex  >>change claritin to chlortrimeton   06/15/2014 Follow up and Med review : COPD GOLD III/  Pt returns for follow up and med review  We reviewed all his meds and organized them into a med calendar with pt education  Appears to be taking meds correctly.  Last visit with copd flare , changed claritin to chlortabs but unable to tolerate during daytime Due to sleepiness.  Also to take 1/2 toprol for weakness, says he never had any episodes so went back on 1 whole tablet of toprol.  rec Follow med calendar closely and bring to each visit.  Please contact office for sooner follow up if symptoms do not improve or worsen or seek emergency care   Follow up Dr. [name]  In 3 months and As needed.    09/21/2014 f/u ov/[name] re: GOLD III  Chief Complaint  Patient presents with  . Follow-up    Pt c/o increased SOB with activity, occ chest tightness, prod cough early mornings with white mucus. Constant sinus drainage and nose bleed on 09/19/14.    Doe better until 3 weeks prior to OV  Then worse sob assoc with nasal congestion off nasonex  - note also stopped omeprazole "I don't have reflux" - see Dg  Es below rec Restart dymista one twice daily  Continue prednisone 20 mg daily until better  then taper  See calendar for specific medication instructions   10/19/2014  f/u ov/[name] re: GOLD III / maint rx symbicort/ spiriva Chief Complaint  Patient presents with  . Follow-up    Pt c/o fatigue and increased chest congestion for  the past several wks. He has am cough with white sputum. He is using xopenex 2 x per day on average.    on 10/8 started pred 20 and zpak as per med calendar action plan  gxt flat x 60mph x 15 min and zero grade on 2lpm with sats mid 90s at home  >decrease pred 5mg    11/19/2014 NP Follow up : COPD GOLD III  Returns for follow up .  Says he had a flare 4 days ago with increased SOB, occasional dry cough, chest tightness. Sinus pressure and drainage. Denies fever, wheezing, nausea or vomiting. Started on Zpack and prednisone 20mg  yesterday .  Feels better   rec Follow med calendar closely and bring to each visit.  Finish Zpack as directed.  Taper prednisone and hold at 5mg  daily  Please contact office for sooner follow up if symptoms do not improve or worsen or seek emergency care     02/19/2015  f/u ov/[name] re: copd GOLD III symb/ spiriva  And 02 2lpm 24/7  Chief Complaint  Patient presents with  . Follow-up    Pt c/o increasing SOB even without exertion and some occasional cough. Pt is still on O2/2L. Pt also c/o fatigue, increased sleep, loss of appetite, irregular pulse. Pt states that when he wakes up he "doesn't feel like he will make it through the day".    on gxt x  2lpm continuous x 66mph on gxt x 15 m at flat grade s stopping  rec For significant swelling take furosemide 20 mg one daily as need as per your med calendar schedule    04/15/2015  f/u ov/[name] re: GOLD III/ hypercarbic/ maint rx symb and spiriva and pred floor of 10 mg and 02 lpm 24/7  Chief Complaint  Patient presents with  . Follow-up    Pt c/o increased nasal congestion and SOB x 2 wks. He is coughing more and producing minmal clear to white sputum.  He is using xopenex inhaler 2-3 x per day.        Wakes up feeling really lousy, mild ha and sluggish despite neg osa eval in past Not using flutter and quite congested in am as well  Doe = MMRC3 = can't walk 100 yards even at a slow pace at a flat grade s stopping due  to sob  Even on 02/ not using treadmill any more  >>cont current regimen   05/20/15 NPFollow up : GOLD III COPD /Hypercarbic RF on O2, chronic steroids at pred 10mg   Patient returns for a one-month follow-up. He is accompanied by his wife. Last visit. Patient continued to have progressive symptoms of shortness of breath, weakness and fatigue. Lab work showed a normal TSH, chronic hypercarbia with bicarbonate on the left) with 35. Normal BNP. And CBC . We reviewed all his medications and organized them into a medication calendar with patient education  appears to be taking his medications correctly. Complains of nasal stuffiness and postnasal drip.   rec Always use nasonex at least one puff at bedtime  use the flutter valve as much as possible  GERD diet  05/21/15 Eval by Dr Dedio/ rec trial of NIV     06/30/2015  f/u ov/[name] re: GOLD IV copd/ 02 dep hypercarbic/hypoxemic on symb 160/spiriva Chief Complaint  Patient presents with  . Acute Visit    Pt c/o increased DOE for the past month. He is also c/o unintended wt loss over the past 3-4 months.    most of his problem is fatigue, not limiting sob, and has yet to be able to use NIV for more than a few min to maybe an hour/ very inactive rec Goal with the 02 is keep sats over 90%  When breathing worse go ahead and double prednisone dose as per the med calendar    Admit date: 09/24/2015 Discharge date: 09/26/2015 Primary Cardiologist:Dr. Tamala Julian   Discharge Diagnoses:  Principal Problem:   Angina at rest [facility]) Active Problems:   Essential hypertension   COPD GOLD IV/ 02 dep /steroid dep   Chronic respiratory failure with hypoxia and hypercapnia (HCC)   CAD (coronary artery disease)   Chest pain   Orthostatic hypotension not corrected on prednisone 20 mg daily    10/04/2015  Post hosp f/u ov/[name] re:  Prednisone 20 mg daily  Plus symb 160 2bid/ spiriva respimat / flutter valve/ has still not started astral noct vent / 02 2lpm 24/7   Chief Complaint  Patient presents with  . Follow-up    Pt. wants to discuss being on Tiotropium and Pulmicort, breathing has not been better,been in the hospital twice, some coughing in the morning   main complaint is feeling lousy when stands up, not sob/ while  maintaining per cards  on pred 20 mg daily while still on multiple bp meds for which he is getting confused in terms of how to use.  No improvement in  orthostasis on 20 mg per day (looks like also tried florinef prior to the being maint on higher "floor" prednisone dose.   Was waking up feeling flushed with high BP but apparently never happened on NIV as never used it "too much else going on"    No obvious day to day or daytime variability or assoc excess/ purulent sputum or mucus plugs or hemoptysis or cp or chest tightness, subjective wheeze or overt sinus or hb symptoms. No unusual exp hx or h/o childhood pna/ asthma or knowledge of premature birth.  Sleeping ok without nocturnal  or early am exacerbation  of respiratory  c/o's or need for noct saba. Also denies any obvious fluctuation of symptoms with weather or environmental changes or other aggravating or alleviating factors except as outlined above   Current Medications, Allergies, Complete Past Medical History, Past Surgical History, Family History, and Social History were reviewed in [employer] record.  ROS  The following are not active complaints unless bolded sore throat, dysphagia, dental problems, itching, sneezing,  nasal congestion or excess/ purulent secretions, ear ache,   fever, chills, sweats, unintended wt loss, classically pleuritic or exertional cp,  orthopnea pnd or leg swelling, presyncope, palpitations, abdominal pain, anorexia, nausea, vomiting, diarrhea  or change in bowel or bladder habits, change in stools or urine, dysuria,hematuria,  rash, arthralgias, visual complaints, headache, numbness, weakness or ataxia or problems with walking  when stands up tto quickly ) or coordination,  change in mood/affect or memory.                             Past Medical History:  CHRONIC RHINITIS (ICD-472.0)  - Sinus ct March 03, 2010 > thickening only, no acute changes  CHRONIC RESPIRATORY FAILURE (ICD-518.83)  - 02 rx since 2008  COPD UNSPECIFIED (ICD-496)....................................................................[name]  - PFTs 05/04/05 FEV1 36% ratio 28% with 29% improvement after bronchodilators DLCO 48%  - PFT's 03/26/08 30% ratio 34 with 14% improvement after bronchodilators DLC0 38 %  - Start noct 02 at 2lpm 2008 and on 03/04/09 desat @ > 185 ft so rec wear with activtiy > rm to rm  - [facility] June 30, 2008 100% > confirmed March 04, 2009  ISCHEMIC HEART DISEASE (ICD-414.9)  HYPERTENSION (ICD-401.9)  HYPERLIPIDEMIA (ICD-272.4)  SPN RUL by cxr August 19, 2009  COMPLEX MED REGIMEN-Meds reviewed with pt education and computerized med calendar 11/22/09 03/10/2011  , 10/15/2012  07/17/2013 , 12/09/2013 , 06/15/2014 , 11/19/14 , 5.11.17          Objective:   Physical Exam  amb wm nad - frail and baseline hoarse, dry cough   / vital signs reviewed    Note sats 93% on 2lpm on arrival   wt 159 04/28/2010  > 10/14/2010 154  > 10/04/2011 150> 12/21/2011  150 > 149 01/29/2012 >154 04/05/2012 > 05/22/2012 148 > 09/30/2012  146 >>144> 10/15/2012 > 12/18/2012  145 > 148 02/14/2013 >148 03/14/2013 > 04/25/2013 148 > 06/19/2013 150 >>07/17/2013 > 08/27/2013 148 > 10/23/2013  148 >153 12/09/2013 > 02/26/2014  153 > 06/01/2014  152 > 157 06/15/2014 > 09/21/2014 146 > 10/19/2014 151 >>148 11/19/14 > 02/24/2015 148 > 06/30/2015    140 >  10/04/2015  136    HEENT  mod nonspecific turbinate edema.  Oropharynx no thrush or excess pnd or cobblestoning.  No JVD or cervical adenopathy. Mild accessory muscle hypertrophy. Trachea midline, nl thryroid. Chest was hyperinflated by percussion  with diminished breath sounds and moderate increased exp time without wheeze.    Regular rate and rhythm without murmur gallop or rub or increase P2 -  trace  bilateral lower ext pitting edema R > L .   Abd: no hsm, nl excursion. Ext warm without cyanosis or clubbing.       DgEs  04/28/14 Mild gastroesophageal reflux. No hiatal hernia.

## 2015-10-04 NOTE — Patient Instructions (Signed)
Continue prednisone 20mg  daily until breathing better then 10 mg daily as per your med calendar   Put the new blood pressure medicines in pencil on your med calendar  Go ahead and use the Astral as much as you can until see Dr DeDios   See Lynelle Smoke NP in 2 weeks with all your medications, even over the counter meds, separated in two separate bags, the ones you take no matter what vs the ones you stop once you feel better and take only as needed when you feel you need them.   Tammy  will generate for you a new user friendly medication calendar that will put [place] all on the same page re: your medication use.     Without this process, it simply isn't possible to assure that we are providing  your outpatient care  with  the attention to detail we feel you deserve.   If we cannot assure that you're getting that kind of care,  then we cannot manage your problem effectively from this clinic.  Once you have seen Tammy and we are sure that we're all on the same page with your medication use she will arrange follow up with me.

## 2015-10-06 ENCOUNTER — Encounter: Payer: Self-pay | Admitting: *Deleted

## 2015-10-06 ENCOUNTER — Other Ambulatory Visit: Payer: Self-pay | Admitting: *Deleted

## 2015-10-06 ENCOUNTER — Encounter: Payer: Self-pay | Admitting: Internal Medicine

## 2015-10-06 ENCOUNTER — Ambulatory Visit (INDEPENDENT_AMBULATORY_CARE_PROVIDER_SITE_OTHER): Payer: Medicare Other | Admitting: Endocrinology

## 2015-10-06 ENCOUNTER — Encounter: Payer: Self-pay | Admitting: Endocrinology

## 2015-10-06 VITALS — BP 130/82 | HR 77 | Temp 98.0°F | Resp 16 | Ht 69.0 in | Wt 136.2 lb

## 2015-10-06 DIAGNOSIS — I779 Disorder of arteries and arterioles, unspecified: Secondary | ICD-10-CM | POA: Diagnosis not present

## 2015-10-06 DIAGNOSIS — I951 Orthostatic hypotension: Secondary | ICD-10-CM

## 2015-10-06 DIAGNOSIS — R5383 Other fatigue: Secondary | ICD-10-CM

## 2015-10-06 LAB — BASIC METABOLIC PANEL
BUN: 20 mg/dL (ref 6–23)
CO2: 36 mEq/L — ABNORMAL HIGH (ref 19–32)
Calcium: 9.5 mg/dL (ref 8.4–10.5)
Chloride: 98 mEq/L (ref 96–112)
Creatinine, Ser: 0.99 mg/dL (ref 0.40–1.50)
GFR: 76.51 mL/min (ref >60.00)
Glucose, Bld: 87 mg/dL (ref 70–99)
Potassium: 4.1 mEq/L (ref 3.5–5.1)
Sodium: 138 mEq/L (ref 135–145)

## 2015-10-06 LAB — T4, FREE: Free T4: 0.68 ng/dL (ref 0.60–1.60)

## 2015-10-06 MED ORDER — FLUDROCORTISONE ACETATE 0.1 MG PO TABS: ORAL_TABLET | ORAL | 3 refills | Status: DC

## 2015-10-06 NOTE — Assessment & Plan Note (Signed)
-   PFTs 05/04/05 FEV1 36% ratio 28% with 29% improvement after bronchodilators DLCO 48%  - PFT's 03/26/08 30% ratio 34 with 14% improvement after bronchodilators DLC0 38 %  - Referred to rehab 12/19/2012 > completed March 2015  - changed to spiriva respimat 02/14/2013  - started daily prednisone 06/19/13 > improved only a little> tapered off mid July 2015 > ok to change to prn prednisone 08/27/2013  - flared off nasonex and gerd rx 09/21/2014 > resumed - 10/19/2014  p extensive coaching HFA effectiveness =    90%  - changed pred to ceiling of 20 / floor of 5 mg daily as of 10/19/2014 > changed to floor of 10 mg daily 10/04/2015  - PFT's  06/08/2015  FEV1 0.58 (22 % ) ratio 27  p No sign % improvement from saba p symb/spiriva prior to study with DLCO  9/9 % corrects to 27 % for alv volume     On max rx with symb/ spiriva/ daily prednisone  The goal with a chronic steroid dependent illness is always arriving at the lowest effective dose that controls the disease/symptoms and not accepting a set "formula" which is based on statistics or guidelines that don't always take into account patient  variability or the natural hx of the dz in every individual patient, which may well vary over time.  For now therefore I recommend the patient maintain  A ceiling of 20 and a floor of 10 mg daily    I had an extended discussion with the patient reviewing all relevant studies completed to date and  lasting 15 to 20 minutes of a 25 minute visit    Each maintenance medication was reviewed in detail including most importantly the difference between maintenance and prns and under what circumstances the prns are to be triggered using an action plan format that is not reflected in the computer generated alphabetically organized AVS but trather by a customized med calendar that reflects the AVS meds with confirmed 100% correlation.   Please see instructions for details which were reviewed in writing and the patient given a copy  highlighting the part that I personally wrote and discussed at today's ov.

## 2015-10-06 NOTE — Patient Instructions (Addendum)
PREDNISONE dose:  Start alternating 20 mg with 15 mg every other day You can use your 10 mg tablets to make 15 mg On October 2: Start taking 15 mg daily On October 9: Start taking 15 mg alternating with 10 mg On October 16: Start taking 10 mg daily  On October 23: Start taking 7.5 mg daily, will need to give you 5 mg tablets for this  FLUDROCORTISONE: Take a half a tablet only on the mornings when the blood pressure is below 100 standing

## 2015-10-06 NOTE — Progress Notes (Signed)
Patient ID: [name], male   DOB: [birth year], 82 y.o.   MRN: [id number]              Chief complaint: ? Adrenal insufficiency   History of Present Illness:   The patient was felt to have adrenal insufficiency when he was admitted with orthostatic hypotension on 09/12/15. He thinks he had tendency to lightheadedness on standing up and not feeling good for about 2 weeks prior to this    Had no symptoms of  nausea,  decreased appetite or known electrolyte disturbance  Patient had been on 5 mg of prednisone for about 8 months prior to this admission for his COPD and had been regular with taking this. He also has had some difficulties with about 10-15 pounds of weight loss over the last few months for no apparent reason  During his hospitalization he was treated with prednisone 20 mg and discharged on this. However he had another admission in the hospital for various reasons and blood pressure was significantly higher at bedtime At that time he was taking hydralazine for hypertension He was again discharged on 20 mg of prednisone along with amlodipine 5 attention  Patient monitors the blood pressure at home with an Omron meter and he thinks that his blood pressure drops  30-40 mm when he stands up and he feels  lightheaded, groggy and disoriented when he stands up especially in the morning  He has also not been noticed to have any electrolyte changes recently  Currently has change in appetite, nausea, has stabilized his weight recently  Wt Readings from Last 3 Encounters:  10/06/15 136 lb 3.2 oz (61.8 kg)  10/04/15 136 lb 3.2 oz (61.8 kg)  09/28/15 135 lb 3.2 oz (61.3 kg)    Lab Results  Component Value Date   CREATININE 0.99 10/06/2015   BUN 20 10/06/2015   NA 138 10/06/2015   K 4.1 10/06/2015   CL 98 10/06/2015   CO2 36 (H) 10/06/2015      Past Medical History:  Diagnosis Date  . Asthma   . BPH (benign prostatic hyperplasia)    severe; s/p multiple biopsies  . CAD  (coronary artery disease)   . Carotid artery occlusion   . Chronic rhinitis   . COPD (chronic obstructive pulmonary disease) (HCC)    2L [place] O2  . Hyperlipidemia   . Hypertension   . Peripheral vascular disease ([facility])   . 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 Syncope and collapse   . Ureteral tumor 08/2015   had endoscopic procedure for evaluation, unable to reach for biopsy    Past Surgical History:  Procedure Laterality Date  . CATARACT EXTRACTION  11-20-11  . CORONARY ARTERY BYPASS GRAFT  2003  . LITHOTRIPSY    . PROSTATE BIOPSY  Oct. 2014    Family History  Problem Relation Age of Onset  . Heart disease Mother   . Breast cancer Mother   . Cancer Mother     Breast cancer  . Hypertension Mother   . Other Mother     AAA  and   Amputation  . Heart disease Father     Before age 79  . Heart attack Father   . Stroke Brother     x3, still living   . Peripheral vascular disease Brother   . Heart disease Brother   . Hyperlipidemia Brother   . Hypertension Brother   . Heart disease Brother   . Allergies Brother  Social History:  reports that he quit smoking about 33 years ago. His smoking use included Cigarettes. He has a 66.00 pack-year smoking history. He has never used smokeless tobacco. He reports that he drinks alcohol. He reports that he does not use drugs.  Allergies:  Allergies  Allergen Reactions  . Sulfonamide Derivatives Swelling    REACTION: facial/tongue swelling  . Cefdinir Diarrhea    REACTION: diarrhea  . Ciprofloxacin Itching and Rash    Red itchy hands  . Nitrofurantoin Swelling    Swollen hands  . Levaquin [Levofloxacin] Itching and Rash      Medication List       Accurate as of 10/06/15 11:36 AM. Always use your most recent med list.          ALPRAZolam 0.25 MG tablet Commonly known as:  XANAX Take 0.25 mg by mouth 2 (two) times daily as needed for anxiety.   amLODipine 5 MG tablet Commonly known as:  NORVASC Take 1 tablet (5 mg total) by  mouth daily.   aspirin 81 MG tablet Take 81 mg by mouth daily.   atorvastatin 20 MG tablet Commonly known as:  LIPITOR Take 20 mg by mouth daily at 6 PM.   finasteride 5 MG tablet Commonly known as:  PROSCAR Take 5 mg by mouth every morning.   fluticasone 50 MCG/ACT nasal spray Commonly known as:  FLONASE Place 2 sprays into both nostrils daily.   folic acid 1 MG tablet Commonly known as:  FOLVITE Take 1 tablet (1 mg total) by mouth daily.   hydrALAZINE 25 MG tablet Commonly known as:  APRESOLINE Take 1 tablet (25 mg total) by mouth every 12 (twelve) hours as needed (for BP >193 systolic or > 790 diastolic). If your BP is 240 systolic you may take one dose of the hydralazine that you have at home.  But would not do this more than twice a day.   metoprolol succinate 25 MG 24 hr tablet Commonly known as:  TOPROL XL TAKE 1 TABLET(25 MG) BY MOUTH DAILY AT NIGHT   multivitamin tablet Take 1 tablet by mouth daily.   NASONEX 50 MCG/ACT nasal spray Generic drug:  mometasone   NITROSTAT 0.4 MG SL tablet Generic drug:  nitroGLYCERIN DISSOLVE 1 TABLET UNDER THE TONGUE AS DIRECTED AND AS NEEDED FOR CHEST PAIN   omeprazole 40 MG capsule Commonly known as:  PRILOSEC TAKE 1 CAPSULE(40 MG) BY MOUTH TWICE DAILY BEFORE A MEAL   OXYGEN Take 2 L by mouth daily as needed. 2 l/m at bedtime and as needed for shortness of breath   predniSONE 20 MG tablet Commonly known as:  DELTASONE Take 1 tablet (20 mg total) by mouth daily with breakfast.   SPIRIVA RESPIMAT 2.5 MCG/ACT Aers Generic drug:  Tiotropium Bromide Monohydrate INHALE 2 PUFFS BY MOUTH EVERY DAY   SYMBICORT 160-4.5 MCG/ACT inhaler Generic drug:  budesonide-formoterol INHALE 2 PUFFS BY MOUTH EVERY MORNING AND THEN ANOTHER 2 PUFFS ABOUT 12 HOURS LATER   thiamine 100 MG tablet Take 1 tablet (100 mg total) by mouth daily.   Vitamin D3 2000 units Tabs Take 2,000 Units by mouth daily.   XOPENEX HFA 45 MCG/ACT  inhaler Generic drug:  levalbuterol INHALE 2 PUFFS BY MOUTH EVERY 4 HOURS AS NEEDED FOR WHEEZING OR SHORTNESS OF BREATH       LABS:  No visits with results within 1 Week(s) from this visit.  Latest known visit with results is:  Admission on 09/24/2015, Discharged on 09/26/2015  Component Date Value Ref Range Status  . Sodium 09/24/2015 138  135 - 145 mmol/L Final  . Potassium 09/24/2015 4.4  3.5 - 5.1 mmol/L Final  . Chloride 09/24/2015 102  101 - 111 mmol/L Final  . CO2 09/24/2015 28  22 - 32 mmol/L Final  . Glucose, Bld 09/24/2015 150* 65 - 99 mg/dL Final  . BUN 09/24/2015 12  6 - 20 mg/dL Final  . Creatinine, Ser 09/24/2015 1.07  0.61 - 1.24 mg/dL Final  . Calcium 09/24/2015 9.9  8.9 - 10.3 mg/dL Final  . GFR calc non Af Amer 09/24/2015 >60  >60 mL/min Final  . GFR calc Af Amer 09/24/2015 >60  >60 mL/min Final   Comment: (NOTE) The eGFR has been calculated using the CKD EPI equation. This calculation has not been validated in all clinical situations. eGFR's persistently <60 mL/min signify possible Chronic Kidney Disease.   . Anion gap 09/24/2015 8  5 - 15 Final  . WBC 09/24/2015 9.3  4.0 - 10.5 K/uL Final  . RBC 09/24/2015 4.48  4.22 - 5.81 MIL/uL Final  . Hemoglobin 09/24/2015 13.9  13.0 - 17.0 g/dL Final  . HCT 09/24/2015 43.8  39.0 - 52.0 % Final  . MCV 09/24/2015 97.8  78.0 - 100.0 fL Final  . MCH 09/24/2015 31.0  26.0 - 34.0 pg Final  . MCHC 09/24/2015 31.7  30.0 - 36.0 g/dL Final  . RDW 09/24/2015 12.5  11.5 - 15.5 % Final  . Platelets 09/24/2015 220  150 - 400 K/uL Final  . Troponin i, poc 09/24/2015 0.01  0.00 - 0.08 ng/mL Final  . Comment 3 09/24/2015          Final   Comment: Due to the release kinetics of cTnI, a negative result within the first hours of the onset of symptoms does not rule out myocardial infarction with certainty. If myocardial infarction is still suspected, repeat the test at appropriate intervals.   . Magnesium 09/24/2015 2.0  1.7 - 2.4  mg/dL Final  . WBC 09/24/2015 8.7  4.0 - 10.5 K/uL Final  . RBC 09/24/2015 4.28  4.22 - 5.81 MIL/uL Final  . Hemoglobin 09/24/2015 13.4  13.0 - 17.0 g/dL Final  . HCT 09/24/2015 41.3  39.0 - 52.0 % Final  . MCV 09/24/2015 96.5  78.0 - 100.0 fL Final  . MCH 09/24/2015 31.3  26.0 - 34.0 pg Final  . MCHC 09/24/2015 32.4  30.0 - 36.0 g/dL Final  . RDW 09/24/2015 12.5  11.5 - 15.5 % Final  . Platelets 09/24/2015 205  150 - 400 K/uL Final  . Creatinine, Ser 09/24/2015 1.05  0.61 - 1.24 mg/dL Final  . GFR calc non Af Amer 09/24/2015 >60  >60 mL/min Final  . GFR calc Af Amer 09/24/2015 >60  >60 mL/min Final   Comment: (NOTE) The eGFR has been calculated using the CKD EPI equation. This calculation has not been validated in all clinical situations. eGFR's persistently <60 mL/min signify possible Chronic Kidney Disease.   . Magnesium 09/25/2015 2.0  1.7 - 2.4 mg/dL Final  . Sodium 09/25/2015 140  135 - 145 mmol/L Final  . Potassium 09/25/2015 3.6  3.5 - 5.1 mmol/L Final  . Chloride 09/25/2015 104  101 - 111 mmol/L Final  . CO2 09/25/2015 29  22 - 32 mmol/L Final  . Glucose, Bld 09/25/2015 100* 65 - 99 mg/dL Final  . BUN 09/25/2015 10  6 - 20 mg/dL Final  . Creatinine, Ser 09/25/2015 0.95  0.61 - 1.24 mg/dL Final  . Calcium 09/25/2015 9.1  8.9 - 10.3 mg/dL Final  . GFR calc non Af Amer 09/25/2015 >60  >60 mL/min Final  . GFR calc Af Amer 09/25/2015 >60  >60 mL/min Final   Comment: (NOTE) The eGFR has been calculated using the CKD EPI equation. This calculation has not been validated in all clinical situations. eGFR's persistently <60 mL/min signify possible Chronic Kidney Disease.   . Anion gap 09/25/2015 7  5 - 15 Final  . Troponin I 09/24/2015 <0.03  <0.03 ng/mL Final  . Troponin I 09/25/2015 0.03* <0.03 ng/mL Final   Comment: CRITICAL RESULT CALLED TO, READ BACK BY AND VERIFIED WITH: RALPH C,RN 09/25/15 0543 WAYK   . Troponin I 09/25/2015 <0.03  <0.03 ng/mL Final     Review of  Systems  Constitutional: Positive for weight loss. Negative for reduced appetite.       10-15 lbs  Respiratory: Positive for shortness of breath.   Cardiovascular: Negative for palpitations and leg swelling.  Gastrointestinal: Negative for constipation.  Endocrine: Positive for fatigue and light-headedness.  Genitourinary: Negative for frequency.  Musculoskeletal: Negative for joint pain.  Neurological: Negative for weakness and numbness.     PHYSICAL EXAM:  BP 130/82   Pulse 77   Temp 98 F (36.7 C)   Resp 16   Ht _0  (1.753 m)   Wt 136 lb 3.2 oz (61.8 kg)   SpO2 98%   BMI 20.11 kg/m   GENERAL:  He is thinly built.  Using continuous oxygen He is very alert and conversing normally but very hard of hearing   No pallor, clubbing, lymphadenopathy or edema.   Skin:  no rash or pigmentation.  EYES:  Externally normal.  Fundii:  Exam not indicated  ENT: Oral mucosa and tongue normal.  THYROID:  Not palpable.  HEART:  Normal  S1 and S2; no murmur or click.  CHEST:  Hyperinflated especially lower part .  Lungs: Vescicular breath sounds heard equally.  No crepitations/ wheeze.  ABDOMEN:  No distention.  Liver is palpable about 4 cm below costal margin, smooth and firm and spleen not palpable.  No other mass or tenderness.  NEUROLOGICAL: .Reflexes are bilaterally normal at biceps.  No tremor present  JOINTS:  Normal.   ASSESSMENT:    History of orthostatic hypotension which is recent.  Etiology of this is unclear  Clinically not clear if she has had any adrenal insufficiency as he has not had any typical symptoms except orthostatic hypotension.  Also baseline evaluation has not been done with a Cortrosyn stimulation test  He probably has some degree of autonomic dysregulation causing his orthostasis although does not appear to have any other neurological syndrome  He has not been treated with any specific drug for his orthostasis; is on AMLODIPINE for  hypertension  His blood pressure appears to be well controlled today and does not have any orthostasis at all  He still reportedly tends to have orthostatic hypotension at home even with 20 mg prednisone although not sure if his home monitor is reliable or not  Currently getting excessive steroids with 20 mg prednisone anyway.  Fatigue and malaise, relatively chronic and unclear of the etiology  He has had long-standing severe COPD but currently does not appear to be symptomatic and his pulmonologist is not wanting to continue high-dose prednisone    PLAN:    Trial of Florinef for orthostatic hypotension.  Since his orthostatic drop in blood pressure  is appearing to be inconsistent and not present today he can take this only as needed on the days he is having  standing systolic blood pressure below 100.  He is fairly compliant with checking his blood pressure regularly with his Omron monitor  He will also be given a gradual tapering dose of his prednisone to final dose of 7.5 mg daily which is equivalent to  30 mg of hydrocortisone.  Further adjustment of prednisone dose will be dependent on his clinical response  Since he may possibly have HYPOPITUITARISM especially with his continued symptoms of fatigue and some cold intolerance will need to check his free T4 level also   Patient Instructions  PREDNISONE dose:  Start alternating 20 mg with 15 mg every other day You can use your 10 mg tablets to make 15 mg On October 2: Start taking 15 mg daily On October 9: Start taking 15 mg alternating with 10 mg On October 16: Start taking 10 mg daily  On October 23: Start taking 7.5 mg daily, will need to give you 5 mg tablets for this  FLUDROCORTISONE: Take a half a tablet only on the mornings when the blood pressure is below 100 standing       [name] 10/06/2015, 11:36 AM

## 2015-10-06 NOTE — Patient Outreach (Signed)
[name]) Care Management  10/06/2015  [name] [birth year] [id number]   CSW was able to make initial contact with patient today to perform phone assessment, as well as assess and assist with social work needs and services.  CSW introduced self, explained role and types of services provided through [facility] ([facility]).  CSW further explained to patient that CSW works with patient's RNCM, also with [facility], [name]. CSW then explained the reason for the call, indicating that [name] thought that patient would benefit from social work services and resources to assist with obtaining assisted living resources, as well as possible placement.  CSW obtained two HIPAA compliant identifiers from patient, which included patient's name and date of birth. CSW was able to speak with patient at length about various assisted living facilities that offer long-term care services.  Patient answered questions that were asked pertaining to cost, services provided, activities performed, etc.  CSW agreed to mail patient and patient's wife, [name] a complete listing of all assisted living facilities in [place].  CSW also agreed to mail patient and [name] a Tourist information centre manager so that they are able to view all assisted living facilities of interest to them.  CSW was able to ensure that [name] has the correct contact information for CSW, encouraging patient to contact CSW directly if additional assistance is needed with regard to placement, or if patient and [name] have additional questions.  Patient voiced understanding and was agreeable to this plan. CSW will perform a case closure on patient, as all goals of treatment have been met from social work standpoint and no additional social work needs have been identified at this time.  CSW will notify patient's RNCM with [facility], [name] of CSW's plans to close patient's case.  CSW will fax an update to patient's Primary Care Physician, [name]  to ensure that they are aware of CSW's involvement with patient's plan of care.  CSW will submit a case closure request to [name], Care Management Assistant with [facility], in the form of an [employer].  CSW will ensure that [name] is aware of [name], RNCM with [facility], continued involvement with patient's care. [name], BSW, MSW, LCSW  Licensed Education officer, [facility]  Mailing [place] [address] [place], [place] [address] Physical Address-[address], [place], [place] [address] Toll Free Main # [phone] Fax # [phone] Cell # [phone]  Fax # [phone]  [name]

## 2015-10-08 ENCOUNTER — Telehealth: Payer: Self-pay | Admitting: Endocrinology

## 2015-10-08 ENCOUNTER — Other Ambulatory Visit: Payer: Self-pay | Admitting: *Deleted

## 2015-10-08 NOTE — Patient Outreach (Signed)
[facility]) Care Management   10/08/2015  [name] June 22, 1929 [id number]  [name] is an 82 y.o. male  Subjective:   Member reports that his health has remained up and down since discharge, particularly speaking of his blood pressure.  He state that once he was up today, his blood pressure dropped to 79/56.  He report that he has been "taking it easy" and has felt a little better throughout the day.  He state that the endocrinologist couldn't perform extensive testing on his adrenal system due to the amount of steroids he has been taking.  He state that he has not started a taper, but was also provided with Florinef to take as needed for BP less than 100.  He state that he has not taken it today as he does not want to make his BP increase too much.  He is advised to take as instructed.  He denies complaints otherwise.  Objective:   ROS  Physical Exam  Encounter Medications:   Outpatient Encounter Prescriptions as of 10/08/2015  Medication Sig Note  . ALPRAZolam (XANAX) 0.25 MG tablet Take 0.25 mg by mouth 2 (two) times daily as needed for anxiety.   Marland Kitchen amLODipine (NORVASC) 5 MG tablet Take 1 tablet (5 mg total) by mouth daily.   Marland Kitchen aspirin 81 MG tablet Take 81 mg by mouth daily.   Marland Kitchen atorvastatin (LIPITOR) 20 MG tablet Take 20 mg by mouth daily at 6 PM.   . Cholecalciferol (VITAMIN D3) 2000 units TABS Take 2,000 Units by mouth daily.   . finasteride (PROSCAR) 5 MG tablet Take 5 mg by mouth every morning.   . fludrocortisone (FLORINEF) 0.1 MG tablet Take half a tablet when blood pressure is below 100 standing in the morning   . fluticasone (FLONASE) 50 MCG/ACT nasal spray Place 2 sprays into both nostrils daily.   . folic acid (FOLVITE) 1 MG tablet Take 1 tablet (1 mg total) by mouth daily.   . hydrALAZINE (APRESOLINE) 25 MG tablet Take 1 tablet (25 mg total) by mouth every 12 (twelve) hours as needed (for BP >643 systolic or > 329 diastolic). If your BP is 518  systolic you may take one dose of the hydralazine that you have at home.  But would not do this more than twice a day.   . metoprolol succinate (TOPROL XL) 25 MG 24 hr tablet TAKE 1 TABLET(25 MG) BY MOUTH DAILY AT NIGHT 09/24/2015: toprol was switched to nightime dose, started 9/14  . Multiple Vitamin (MULTIVITAMIN) tablet Take 1 tablet by mouth daily.   Marland Kitchen NASONEX 50 MCG/ACT nasal spray  10/06/2015: Received from: External Pharmacy  . NITROSTAT 0.4 MG SL tablet DISSOLVE 1 TABLET UNDER THE TONGUE AS DIRECTED AND AS NEEDED FOR CHEST PAIN   . omeprazole (PRILOSEC) 40 MG capsule TAKE 1 CAPSULE(40 MG) BY MOUTH TWICE DAILY BEFORE A MEAL   . OXYGEN Take 2 L by mouth daily as needed. 2 l/m at bedtime and as needed for shortness of breath   . predniSONE (DELTASONE) 20 MG tablet Take 1 tablet (20 mg total) by mouth daily with breakfast.   . SPIRIVA RESPIMAT 2.5 MCG/ACT AERS INHALE 2 PUFFS BY MOUTH EVERY DAY   . SYMBICORT 160-4.5 MCG/ACT inhaler INHALE 2 PUFFS BY MOUTH EVERY MORNING AND THEN ANOTHER 2 PUFFS ABOUT 12 HOURS LATER   . thiamine 100 MG tablet Take 1 tablet (100 mg total) by mouth daily.   [name] HFA 45 MCG/ACT  inhaler INHALE 2 PUFFS BY MOUTH EVERY 4 HOURS AS NEEDED FOR WHEEZING OR SHORTNESS OF BREATH    No facility-administered encounter medications on file as of 10/08/2015.     Functional Status:   In your present state of health, do you have any difficulty performing the following activities: 10/06/2015 09/24/2015  Hearing? [name]  Vision? N N  Difficulty concentrating or making decisions? [name]  Walking or climbing stairs? N N  Dressing or bathing? N N  Doing errands, shopping? N N  Preparing Food and eating ? Y -  Using the Toilet? N -  In the past six months, have you accidently leaked urine? Y -  Do you have problems with loss of bowel control? N -  Managing your Medications? Y -  Managing your Finances? Y -  Housekeeping or managing your Housekeeping? Y -  Some recent data might be  hidden    Fall/Depression Screening:    PHQ 2/9 Scores 10/06/2015 12/26/2012  PHQ - 2 Score 1 2  PHQ- 9 Score - 5   Fall Risk  10/06/2015 12/26/2012  Falls in the past year? Yes No  Number falls in past yr: 2 or more -  Injury with Fall? Yes -  Risk Factor Category  High Fall Risk -  Risk for fall due to : Impaired balance/gait;Impaired mobility;History of fall(s) Impaired balance/gait;Medication side effect  Follow up Education provided;Falls prevention discussed -    Assessment:    Member continue to deny home visit, stating all concerns can be addressed telephonically.  He and his wife has a good relationship with physicians and are aware of when to contact with concerns/questions.  The deny any further needs at this time.  Plan:   Will follow up with member next week.  [facility] CM Care Plan Problem One   Flowsheet Row Most Recent Value  Care Plan Problem One  Risk for readmission related to chest pain and hypotension as evidenced by recent hospitalization  Role Documenting the Problem One  Care Management [place] for Problem One  Active  [facility] Long Term Goal (31-90 days)  Member will not be readmitted to hospital within 31 days of hospital discharge  [place] Term Goal Start Date  09/30/15  Interventions for Problem One Long Term Goal  Reviewed with wife discharge instructions.  Discussed with wife the importance of following all discharge instructions as it aids in decreasing risk of readmission  [facility] CM Short Term Goal #1 (0-30 days)  Member/wife will check blood pressure daily and record readings over the next 4 weeks  [facility] CM Short Term Goal #1 Start Date  09/30/15  Interventions for Short Term Goal #1  Discussed with wife the importance of monitoring blood pressure, monitoring for hypotension.  Advised wife of interventions for hypotension, wife verbalizes understanding  [facility] CM Short Term Goal #2 (0-30 days)  Member will report keeping/attending follow up appointment  with endocrionologist next week  [facility] CM Short Term Goal #2 Start Date  09/30/15  [facility] CM Short Term Goal #2 Met Date  10/08/15  Interventions for Short Term Goal #2  Discussed with wife the importance of endocrinologist appointment.  Advised of relation between adrenal insufficiency and hypotension.     Valente Robert, [place], MSN [place] [phone]

## 2015-10-08 NOTE — Telephone Encounter (Signed)
Please see below and advise.

## 2015-10-08 NOTE — Telephone Encounter (Signed)
He can take them both together if the standing blood pressure is below 100.  If it is over 100 he takes only amlodipine

## 2015-10-08 NOTE — Telephone Encounter (Signed)
fluticortisone to take in AM when BP is below 100  He normally also takes med for high bp amlodipine  How is he supposed to alternate these meds or take these meds. He just wants to know how to manage them according to his BP readings.

## 2015-10-08 NOTE — Telephone Encounter (Signed)
I contacted the patient and advised of instructions. Patient voiced understanding and had no further questions at this time.  

## 2015-10-15 ENCOUNTER — Other Ambulatory Visit: Payer: Self-pay | Admitting: *Deleted

## 2015-10-15 NOTE — Patient Outreach (Signed)
[facility]) Care Management  10/15/2015  [name] 06-02-1929 [id number]   Weekly transition of care call placed to member.  He report that he is feeling "better" but still remain with low blood pressure often.  He report that today's reading was much better than previous days, unable to state exact numbers (wife assist with monitoring).  He state that he did have to take dose of Florinef yesterday, which helped, but he report that he feels confused after taking it.  He state that he will contact his cardiologist regarding the side effects, but will continue to take as needed.  He denies any chest pain or other discomfort.  He continues to deny the need of a home visit, stating that he and his wife manages his care well.  He denies any questions at this time, will follow up next week.  [name], [place], MSN [place] [phone]

## 2015-10-18 ENCOUNTER — Ambulatory Visit (INDEPENDENT_AMBULATORY_CARE_PROVIDER_SITE_OTHER): Payer: Medicare Other | Admitting: Adult Health

## 2015-10-18 ENCOUNTER — Encounter: Payer: Self-pay | Admitting: Adult Health

## 2015-10-18 DIAGNOSIS — J449 Chronic obstructive pulmonary disease, unspecified: Secondary | ICD-10-CM | POA: Diagnosis not present

## 2015-10-18 MED ORDER — AZITHROMYCIN 250 MG PO TABS: ORAL_TABLET | ORAL | 0 refills | Status: AC

## 2015-10-18 NOTE — Patient Instructions (Signed)
Zpack take as directed.  Mucinex DM Twice daily  As needed  Cough/congestion  Saline nasal rinses As needed   Taper prednisone as directed.  Follow up with Dr. [name]  In 2-3 months and As needed   Please contact office for sooner follow up if symptoms do not improve or worsen or seek emergency care

## 2015-10-18 NOTE — Progress Notes (Signed)
Subjective:    Patient ID: [name], male    DOB: 1929-09-09   MRN: [id number]     Summary: Pulmonary/ f/u ov try astepro for pnds   Primary Provider/Referring Provider: [name]    Brief patient profile:  25  yowm last smoked 1983 with GOLD III COPD with some reversibility and 02 dep at hs and with activity    History of Present Illness   05/01/2011 f/u ov/Wert cc 50 ft outside, one aisle at [facility], whole store with 02. No cough and minimal need for saba Seen at [facility] rec change ipatropium neb  to atrovent > no change doe. rec ry tudorza one puff twice daily and stop atrovent and see how it effects your activity performance, if not benefit ok to change back to atrovent   10/04/2011 f/u ov/Wert did not bring med calendar cc  Loosing ground on tudorza so went to atrovent neb and seemed to help. rec Plan A = use automatically no matter what Symbicort followed by Atrovent each am and symbiocort again in 12 hours plus continue the atrovent though the day in between about every 4-6 hours Only use your albuterol (xopenex hfa -puffer-  is Plan B,  Nebulizer is plan C) .      12/18/2012 f/u ov/Wert re: copd/ 02  at bedtime and with exertion  Chief Complaint  Patient presents with  . Follow-up    Increased sob at all times x1 month.  using O2 more frequently   actually no sob at rest or at hs, wears 02 with activity and when he does wear it the sats are fine even when he has to stop due to sob rec Plan A= automatic  symbiocort and tudorza twice daily and ok to use your 02 more consistently with activity that you know makes you short of breath Plan B = backup > xopenex 2 every 4 if needed if can't catch your breath Plan C = xopenex neb every 4 hours if plan B not working, always try plan B first  Please see patient coordinator before you leave today  to schedule rehab  02/14/2013 f/u ov/Wert re:  Chief Complaint  Patient presents with  . Follow-up    Wants to make sure his medications  are correct.  Pt c/o increased SOB with exertion.  easily confused with details of care, only using hfa xopenex not the neb xopenex, times right before he goes to rehab and feels he's making slow progress.  >>changed Symbicort to [facility] and atrovent neb to spiriva respimat     04/25/2013 f/u ov/Wert re:  Chief Complaint  Patient presents with  . Acute Visit    Pt c/o increased DOE for the past 3 wks- worse for the past 4-5 days. He states that he is SOB with any exertion at all such as just raising up his arms. He also c/o nasal congestion for the past 3 months. He is using xopenex HFA at least twice per day.    incxrease doe assoc with prod of more mucus and nasal congestion  but better overnight and worse as day goes on, occ green mucus but mostly white No desats walking on 02 at rehab 2lpm rec Prednisone 10 mg take  4 each am x 2 days,   2 each am x 2 days,  1 each am x 2 days and stop  Follow med calendar    06/19/2013 f/u ov/Wert re: GOLD III COPD with some reversibility on dulera 200 2bid and  spriva respimat  Chief Complaint  Patient presents with  . Follow-up    Pt c/o increased DOE for the past month. He states he gets SOB with walking approx 50 ft.    using xopenex qid never neb Did get def transient  benefit from prednisone  >steroid challenge   07/17/2013 Follow up and Med review  Returns for follow up and med review . We reviewed  All his meds and organized them into a med calendar w/ pt education.  Last visit was started on prednisone 20 mg with taper down to 10 mg and hold. Patient says that his breathing has improved. We discussed steroid use, and he wishes to taper off of this due to potential side effects.  rec Follow med calendar closely and bring to each visit.  Decrease prednisone 10mg  1/2 daily for 1 week then 1/2 every other day for 1 week and then stop.  May use saline nasal spray and gel As needed  For congestion .    08/27/2013 f/u ov/Wert re: copd/ 02 dep  with sleep/ ex / on symbicort and spiriva  Chief Complaint  Patient presents with  . Follow-up    ROV : Pt c/o incr SOB and states that he has had a recent decrease in activity--does not feel like his stamina/endurance is where it needs to be.   Variable doe  seemed some better on prednisone - never sob at rest or sleeping  Wears 02 when sleeps at 2 and exercises at 2lpm on treadmill at 1.2 mph level, goes 5 min rest 1 min Has not tried pre treating with xopenex before he does the treadmill, never tried slow setting, never uses neb xopenex rec Try using your xopenex hfa  before planned walk on your treadmill to see what difference if any this makes Prevnar 13 given today Resume prednisone 10 mg x 2 daily when xopenx is not lasting 4 hours, then 1 daily x 5 days and stop      10/23/2013 f/u ov/Wert re: copd/02 dep on symb/spiriva/ xopenex prn plus back up pred short course Chief Complaint  Patient presents with  . Follow-up    SOB   feels prednisone helped a lot with sob and less need for saba Able do treadmill x 15 min x 1 mph no grade no  wheeze overt sinus or hb symptoms.   rec Try using your xopenex hfa  before planned walk on your treadmill to see what difference if any this makes prevnar 13 given today Resume prednisone 10 mg x 2 daily when xopenx is not lasting 4 hours, then 1 daily x 5 days and stop   02/26/2014 f/u ov/Wert re: GOLD III/ confused with action plan portion of med calendar  Chief Complaint  Patient presents with  . Follow-up    Pt c/o increased cough and SOB for the past 3 wks. His cough is worse in the am and is prod with white to light green sputum. He is using xopenex inhaler about 3-4 x per day and has not used neb.    treadmill x 15 min s stopping  x 1.2 mph flat/ on 02 2lpm with sats no lower than 93  Cough is day > noct, not flaring in am, no purulent sputum rec Goal is to keep sats over 90% with activity     06/01/2014 f/u ov/Wert re: COPD GOLD III/  symb/ spiriva respimat prednsione ? Dose / did not bring med calendar Chief Complaint  Patient presents  with  . Follow-up    Pt states his breathing is progressively worse since the last visit.    increased to 30 m on 02  1.2 mph, no tilt s stopping p 10 min to rest on 2lpm with adequate sats reported    some days  Prednisone 10 mg tapered but confused with instructions Drippy nose no better with flonase / clariton > has not tried 1st gen H1  Not much change using saba before ex  >>change claritin to chlortrimeton   06/15/2014 Follow up and Med review : COPD GOLD III/  Pt returns for follow up and med review  We reviewed all his meds and organized them into a med calendar with pt education  Appears to be taking meds correctly.  Last visit with copd flare , changed claritin to chlortabs but unable to tolerate during daytime Due to sleepiness.  Also to take 1/2 toprol for weakness, says he never had any episodes so went back on 1 whole tablet of toprol.  rec Follow med calendar closely and bring to each visit.  Please contact office for sooner follow up if symptoms do not improve or worsen or seek emergency care   Follow up Dr. [name]  In 3 months and As needed.    09/21/2014 f/u ov/Wert re: GOLD III  Chief Complaint  Patient presents with  . Follow-up    Pt c/o increased SOB with activity, occ chest tightness, prod cough early mornings with white mucus. Constant sinus drainage and nose bleed on 09/19/14.    Doe better until 3 weeks prior to OV  Then worse sob assoc with nasal congestion off nasonex  - note also stopped omeprazole "I don't have reflux" - see Dg  Es below rec Restart dymista one twice daily  Continue prednisone 20 mg daily until better  then taper  See calendar for specific medication instructions   10/19/2014  f/u ov/Wert re: GOLD III / maint rx symbicort/ spiriva Chief Complaint  Patient presents with  . Follow-up    Pt c/o fatigue and increased chest congestion for  the past several wks. He has am cough with white sputum. He is using xopenex 2 x per day on average.    on 10/8 started pred 20 and zpak as per med calendar action plan  gxt flat x 35mph x 15 min and zero grade on 2lpm with sats mid 90s at home  >decrease pred 5mg    11/19/2014 NP Follow up : COPD GOLD III  Returns for follow up .  Says he had a flare 4 days ago with increased SOB, occasional dry cough, chest tightness. Sinus pressure and drainage. Denies fever, wheezing, nausea or vomiting. Started on Zpack and prednisone 20mg  yesterday .  Feels better   rec Follow med calendar closely and bring to each visit.  Finish Zpack as directed.  Taper prednisone and hold at 5mg  daily  Please contact office for sooner follow up if symptoms do not improve or worsen or seek emergency care     02/19/2015  f/u ov/Wert re: copd GOLD III symb/ spiriva  And 02 2lpm 24/7  Chief Complaint  Patient presents with  . Follow-up    Pt c/o increasing SOB even without exertion and some occasional cough. Pt is still on O2/2L. Pt also c/o fatigue, increased sleep, loss of appetite, irregular pulse. Pt states that when he wakes up he "doesn't feel like he will make it through the day".    on gxt x  2lpm continuous x 61mph on gxt x 15 m at flat grade s stopping  rec For significant swelling take furosemide 20 mg one daily as need as per your med calendar schedule    04/15/2015  f/u ov/Wert re: GOLD III/ hypercarbic/ maint rx symb and spiriva and pred floor of 10 mg and 02 lpm 24/7  Chief Complaint  Patient presents with  . Follow-up    Pt c/o increased nasal congestion and SOB x 2 wks. He is coughing more and producing minmal clear to white sputum.  He is using xopenex inhaler 2-3 x per day.        Wakes up feeling really lousy, mild ha and sluggish despite neg osa eval in past Not using flutter and quite congested in am as well  Doe = MMRC3 = can't walk 100 yards even at a slow pace at a flat grade s stopping due  to sob  Even on 02/ not using treadmill any more  >>cont current regimen   05/20/15 NPFollow up : GOLD III COPD /Hypercarbic RF on O2, chronic steroids at pred 10mg   Patient returns for a one-month follow-up. He is accompanied by his wife. Last visit. Patient continued to have progressive symptoms of shortness of breath, weakness and fatigue. Lab work showed a normal TSH, chronic hypercarbia with bicarbonate on the left) with 35. Normal BNP. And CBC . We reviewed all his medications and organized them into a medication calendar with patient education  appears to be taking his medications correctly. Complains of nasal stuffiness and postnasal drip.   rec Always use nasonex at least one puff at bedtime  use the flutter valve as much as possible  GERD diet  05/21/15 Eval by Dr Dedio/ rec trial of NIV     06/30/2015  f/u ov/Wert re: GOLD IV copd/ 02 dep hypercarbic/hypoxemic on symb 160/spiriva Chief Complaint  Patient presents with  . Acute Visit    Pt c/o increased DOE for the past month. He is also c/o unintended wt loss over the past 3-4 months.    most of his problem is fatigue, not limiting sob, and has yet to be able to use NIV for more than a few min to maybe an hour/ very inactive rec Goal with the 02 is keep sats over 90%  When breathing worse go ahead and double prednisone dose as per the med calendar    Admit date: 09/24/2015 Discharge date: 09/26/2015 Primary Cardiologist:Dr. Tamala Julian   Discharge Diagnoses:  Principal Problem:   Angina at rest [facility]) Active Problems:   Essential hypertension   COPD GOLD IV/ 02 dep /steroid dep   Chronic respiratory failure with hypoxia and hypercapnia (HCC)   CAD (coronary artery disease)   Chest pain   Orthostatic hypotension not corrected on prednisone 20 mg daily    10/04/2015  Post hosp f/u ov/Wert re:  Prednisone 20 mg daily  Plus symb 160 2bid/ spiriva respimat / flutter valve/ has still not started astral noct vent / 02 2lpm 24/7   Chief Complaint  Patient presents with  . Follow-up    Pt. wants to discuss being on Tiotropium and Pulmicort, breathing has not been better,been in the hospital twice, some coughing in the morning   main complaint is feeling lousy when stands up, not sob/ while  maintaining per cards  on pred 20 mg daily while still on multiple bp meds for which he is getting confused in terms of how to use.  No improvement in  orthostasis on 20 mg per day (looks like also tried florinef prior to the being maint on higher "floor" prednisone dose.   Was waking up feeling flushed with high BP but apparently never happened on NIV as never used it "too much else goingtlined above  >Prednisone taper .   10/18/2015 Acute OV :  GOLD IV copd/ 02 dep hypercarbic/hypoxemic Pt presents for an acute office visit. Complains of 1 week of sinus congestion, drainage , and sinus pressure .  No significant increased cough or wheezing . Is using flonase and sinus rinses As needed  .  Remains on Symbicort and Spiriva . On O2 at 2l/m .  Denies increased edema, chest pain, orthopnea, or fever.   He continues to have low energy and flucuations in b/p.  Seeing endocrinology , undergoing workup for possible  adrenal insufficieny . Has a very slow prednisone taper. Currently on Prednisone 15mg  alternating 10mg  daily  And florinef .     Current Medications, Allergies, Complete Past Medical History, Past Surgical History, Family History, and Social History were reviewed in [employer] record.  ROS  The following are not active complaints unless bolded sore throat, dysphagia, dental problems, itching, sneezing,  nasal congestion or excess/ purulent secretions, ear ache,   fever, chills, sweats, unintended wt loss, classically pleuritic or exertional cp,  orthopnea pnd or leg swelling, presyncope, palpitations, abdominal pain, anorexia, nausea, vomiting, diarrhea  or change in bowel or bladder habits, change in  stools or urine, dysuria,hematuria,  rash, arthralgias, visual complaints, headache, numbness, weakness ,  change in mood/affect or memory.                    Past Medical History:  CHRONIC RHINITIS (ICD-472.0)  - Sinus ct March 03, 2010 > thickening only, no acute changes  CHRONIC RESPIRATORY FAILURE (ICD-518.83)  - 02 rx since 2008  COPD UNSPECIFIED (ICD-496)....................................................................[name]  - PFTs 05/04/05 FEV1 36% ratio 28% with 29% improvement after bronchodilators DLCO 48%  - PFT's 03/26/08 30% ratio 34 with 14% improvement after bronchodilators DLC0 38 %  - Start noct 02 at 2lpm 2008 and on 03/04/09 desat @ > 185 ft so rec wear with activtiy > rm to rm  - [facility] June 30, 2008 100% > confirmed March 04, 2009  ISCHEMIC HEART DISEASE (ICD-414.9)  HYPERTENSION (ICD-401.9)  HYPERLIPIDEMIA (ICD-272.4)  SPN RUL by cxr August 19, 2009  COMPLEX MED REGIMEN-Meds reviewed with pt education and computerized med calendar 11/22/09 03/10/2011  , 10/15/2012  07/17/2013 , 12/09/2013 , 06/15/2014 , 11/19/14 , 5.11.17          Objective:   Physical Exam  amb wm nad - frail / vital signs reviewed    Vitals:   10/18/15 1408  BP: (!) 150/72  Pulse: 69  Temp: 97.7 F (36.5 C)  TempSrc: Oral  SpO2: 98%  Weight: 136 lb (61.7 kg)  Height: 5\' 9"  (1.753 m)     wt 159 04/28/2010  > 10/14/2010 154  > 10/04/2011 150> 12/21/2011  150 > 149 01/29/2012 >154 04/05/2012 > 05/22/2012 148 > 09/30/2012  146 >>144> 10/15/2012 > 12/18/2012  145 > 148 02/14/2013 >148 03/14/2013 > 04/25/2013 148 > 06/19/2013 150 >>07/17/2013 > 08/27/2013 148 > 10/23/2013  148 >153 12/09/2013 > 02/26/2014  153 > 06/01/2014  152 > 157 06/15/2014 > 09/21/2014 146 > 10/19/2014 151 >>148 11/19/14 > 02/24/2015 148 > 06/30/2015    140 >  10/04/2015  136    HEENT  mod  nonspecific turbinate edema.  Oropharynx no thrush or excess pnd or cobblestoning.  No JVD or cervical adenopathy. Mild accessory muscle hypertrophy.  Trachea midline, nl thryroid. Chest was hyperinflated by percussion with diminished breath sounds and moderate increased exp time without wheeze.   Regular rate and rhythm without murmur gallop or rub or increase P2 -  trace  bilateral lower ext pitting edema R > L .   Abd: no hsm, nl excursion. Ext warm without cyanosis or clubbing.       DgEs  04/28/14 Mild gastroesophageal reflux. No hiatal hernia.

## 2015-10-18 NOTE — Assessment & Plan Note (Signed)
Mild flare with URI/sinusitis   Plan  Patient Instructions  Zpack take as directed.  Mucinex DM Twice daily  As needed  Cough/congestion  Saline nasal rinses As needed   Taper prednisone as directed.  Follow up with Dr. [name]  In 2-3 months and As needed   Please contact office for sooner follow up if symptoms do not improve or worsen or seek emergency care

## 2015-10-19 ENCOUNTER — Telehealth: Payer: Self-pay | Admitting: Interventional Cardiology

## 2015-10-19 NOTE — Telephone Encounter (Signed)
Pt c/o BP issue: STAT if pt c/o blurred vision, one-sided weakness or slurred speech  1. What are your last 5 BP readings? 86/53 HR68  2. Are you having any other symptoms (ex. Dizziness, headache, blurred vision, passed out)?  yes  3. What is your BP issue? Below 100   Pt wants to know how he should take his medications now that's its falling below 100

## 2015-10-19 NOTE — Telephone Encounter (Signed)
Spoke with patient who called to ask if Dr. Tamala Julian is in agreement that he start Florinef 0.1 mg - take 1/2 tab when BP is below 123XX123 mmHg systolic.  These instructions were given by Dr. Dwyane Dee on 9/27.  He states when he saw Dr. Tamala Julian on 9/19 he just told him to call if BP was <100 mmHg.  He states he feels "horrible" - feels confused, light-headed, and fatigued.  He states he is staying well hydrated and has a great appetite and is eating sensible foods.  He states BP is usually low, around 123XX123 mmHg systolic; states he has only had one episode of elevated BP recently.  He thinks the amlodipine might be contributing to his lower BP.  I advised that I will discuss with Dr. Tamala Julian and call him back with his advice.

## 2015-10-19 NOTE — Progress Notes (Signed)
Chart and office note reviewed in detail  > agree with a/p as outlined Note pt is steroid dep at this point for his copd  regardless of adrenal status

## 2015-10-19 NOTE — Telephone Encounter (Signed)
I reviewed with Dr. Tamala Julian who advised patient to stop Amlodipine.  Dr. Tamala Julian states he is in agreement with Dr. Ronnie Derby instructions regarding taking florinef for BP < 123XX123 mmHg systolic.  He advises patient follow-up with Dr. Dwyane Dee for continued hypotension.  I reviewed Dr. Thompson Caul advice with patient who verbalized understanding.  He asked me what he should do if BP is elevated.  I reviewed the instructions for hydralazine use and advised him to be cautious in taking medication too quickly.  I advised him not to take the hydralazine for 1 episode of hypertension but rather for a pattern of high BP.  He verbalized understanding and agreement with plan and thanked me for the call.

## 2015-10-21 DIAGNOSIS — I998 Other disorder of circulatory system: Secondary | ICD-10-CM | POA: Diagnosis not present

## 2015-10-21 DIAGNOSIS — R109 Unspecified abdominal pain: Secondary | ICD-10-CM | POA: Diagnosis not present

## 2015-10-21 DIAGNOSIS — F419 Anxiety disorder, unspecified: Secondary | ICD-10-CM | POA: Diagnosis not present

## 2015-10-21 DIAGNOSIS — D494 Neoplasm of unspecified behavior of bladder: Secondary | ICD-10-CM | POA: Diagnosis not present

## 2015-10-22 ENCOUNTER — Other Ambulatory Visit: Payer: Self-pay | Admitting: *Deleted

## 2015-10-22 NOTE — Patient Outreach (Signed)
[facility]) Care Management  10/22/2015  [name] [birth year] [id number]   Weekly transition of care call placed to member.  He state that he is beginning to feel better, but still has some intermittent lightheadedness and dizziness.  He report that his blood pressure is slightly higher, systolic around 123XX123.  He has been told to stop taking Amlodipine, which he contributes to his more stable blood pressure.  He denies any chest pain or discomfort.  His next follow up is next week with the endocrinologist to further test his adrenal function.  He denies any questions or concerns at this time.  Will follow up next week.  [name], [place], MSN [name] [phone]

## 2015-10-27 ENCOUNTER — Encounter: Payer: Self-pay | Admitting: Endocrinology

## 2015-10-27 ENCOUNTER — Other Ambulatory Visit: Payer: Self-pay | Admitting: *Deleted

## 2015-10-27 ENCOUNTER — Ambulatory Visit (INDEPENDENT_AMBULATORY_CARE_PROVIDER_SITE_OTHER): Payer: Medicare Other | Admitting: Endocrinology

## 2015-10-27 VITALS — BP 112/58 | HR 77 | Temp 98.2°F | Resp 16 | Ht 67.0 in | Wt 137.0 lb

## 2015-10-27 DIAGNOSIS — R5383 Other fatigue: Secondary | ICD-10-CM

## 2015-10-27 DIAGNOSIS — I951 Orthostatic hypotension: Secondary | ICD-10-CM | POA: Diagnosis not present

## 2015-10-27 DIAGNOSIS — I779 Disorder of arteries and arterioles, unspecified: Secondary | ICD-10-CM | POA: Diagnosis not present

## 2015-10-27 LAB — BASIC METABOLIC PANEL
BUN: 19 mg/dL (ref 6–23)
CO2: 34 mEq/L — ABNORMAL HIGH (ref 19–32)
Calcium: 9.8 mg/dL (ref 8.4–10.5)
Chloride: 99 mEq/L (ref 96–112)
Creatinine, Ser: 0.95 mg/dL (ref 0.40–1.50)
GFR: 80.23 mL/min (ref >60.00)
Glucose, Bld: 101 mg/dL — ABNORMAL HIGH (ref 70–99)
Potassium: 4.2 mEq/L (ref 3.5–5.1)
Sodium: 139 mEq/L (ref 135–145)

## 2015-10-27 LAB — T4, FREE: Free T4: 0.8 ng/dL (ref 0.60–1.60)

## 2015-10-27 MED ORDER — PREDNISONE 5 MG PO TABS: ORAL_TABLET | ORAL | 3 refills | Status: DC

## 2015-10-27 NOTE — Patient Instructions (Signed)
Florinef 1/2 tab twice a week at nite on wednesdays and saturdays

## 2015-10-27 NOTE — Progress Notes (Signed)
Patient ID: [name], male   DOB: 1930-01-04, 82 y.o.   MRN: [id number]              Chief complaint: Follow-up of low blood pressure  History of Present Illness:   The patient was felt to have adrenal insufficiency when he was admitted with orthostatic hypotension on 09/12/15. Previous symptoms were lightheadedness on standing up and not feeling good for about 2 weeks prior to this  Had no symptoms of  nausea,  decreased appetite or known electrolyte disturbance but had lost about 10-15 pounds of weight loss over the last few months for no apparent reason Patient had been on 5 mg of prednisone for about 8 months prior to this admission for his COPD   During his hospitalization he was treated with fluids and IV steroids and then discharged on prednisone 20 mg without any specific evaluation for cortisol levels or a Cortrosyn test.  Recent history: On his initial consultation he was told to try Florinef and blood pressure was below 123XX123 systolic standing up Patient monitors the blood pressure at home with an [employer] meter  He says he does feel a little lightheaded and somewhat fuzzy headed especially when he is getting up out of bed in the morning Lowest blood pressure has been 88/45 but usually blood pressure is over 123XX123 systolic after he is up and about  Usual blood pressure is about 136/70 including when he is checking it in bed and highest blood pressure has been about 99991111 systolic Blood pressure has not been dropping lower which had been the problem previously  No recent weight loss or change in appetite He does complain of fatigue  Wt Readings from Last 3 Encounters:  10/27/15 137 lb (62.1 kg)  10/18/15 136 lb (61.7 kg)  10/06/15 136 lb 3.2 oz (61.8 kg)    Lab Results  Component Value Date   CREATININE 0.95 10/27/2015   BUN 19 10/27/2015   NA 139 10/27/2015   K 4.2 10/27/2015   CL 99 10/27/2015   CO2 34 (H) 10/27/2015      Past Medical History:  Diagnosis Date  .  Asthma   . BPH (benign prostatic hyperplasia)    severe; s/p multiple biopsies  . CAD (coronary artery disease)   . Carotid artery occlusion   . Chronic rhinitis   . COPD (chronic obstructive pulmonary disease) (HCC)    2L [place] O2  . Hyperlipidemia   . Hypertension   . Peripheral vascular disease ([place])   . 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 Syncope and collapse   . Ureteral tumor 08/2015   had endoscopic procedure for evaluation, unable to reach for biopsy    Past Surgical History:  Procedure Laterality Date  . CATARACT EXTRACTION  11-20-11  . CORONARY ARTERY BYPASS GRAFT  2003  . LITHOTRIPSY    . PROSTATE BIOPSY  Oct. 2014    Family History  Problem Relation Age of Onset  . Heart disease Mother   . Breast cancer Mother   . Cancer Mother     Breast cancer  . Hypertension Mother   . Other Mother     AAA  and   Amputation  . Heart disease Father     Before age 5  . Heart attack Father   . Stroke Brother     x3, still living   . Peripheral vascular disease Brother   . Heart disease Brother   . Hyperlipidemia Brother   .  Hypertension Brother   . Heart disease Brother   . Allergies Brother     Social History:  reports that he quit smoking about 33 years ago. His smoking use included Cigarettes. He has a 66.00 pack-year smoking history. He has never used smokeless tobacco. He reports that he drinks alcohol. He reports that he does not use drugs.  Allergies:  Allergies  Allergen Reactions  . Sulfonamide Derivatives Swelling    REACTION: facial/tongue swelling  . Cefdinir Diarrhea    REACTION: diarrhea  . Ciprofloxacin Itching and Rash    Red itchy hands  . Nitrofurantoin Swelling    Swollen hands  . Levaquin [Levofloxacin] Itching and Rash      Medication List       Accurate as of 10/27/15  9:33 PM. Always use your most recent med list.          ALPRAZolam 0.25 MG tablet Commonly known as:  XANAX Take 0.25 mg by mouth 2 (two) times daily as needed for anxiety.    aspirin 81 MG tablet Take 81 mg by mouth daily.   atorvastatin 20 MG tablet Commonly known as:  LIPITOR Take 20 mg by mouth daily at 6 PM.   finasteride 5 MG tablet Commonly known as:  PROSCAR Take 5 mg by mouth every morning.   fludrocortisone 0.1 MG tablet Commonly known as:  FLORINEF Take half a tablet when blood pressure is below 100 standing in the morning   fluticasone 50 MCG/ACT nasal spray Commonly known as:  FLONASE Place 2 sprays into both nostrils daily.   folic acid 1 MG tablet Commonly known as:  FOLVITE Take 1 tablet (1 mg total) by mouth daily.   hydrALAZINE 25 MG tablet Commonly known as:  APRESOLINE Take 1 tablet (25 mg total) by mouth every 12 (twelve) hours as needed (for BP XX123456 systolic or > 123XX123 diastolic). If your BP is 99991111 systolic you may take one dose of the hydralazine that you have at home.  But would not do this more than twice a day.   metoprolol succinate 25 MG 24 hr tablet Commonly known as:  TOPROL XL TAKE 1 TABLET(25 MG) BY MOUTH DAILY AT NIGHT   multivitamin tablet Take 1 tablet by mouth daily.   NASONEX 50 MCG/ACT nasal spray Generic drug:  mometasone   NITROSTAT 0.4 MG SL tablet Generic drug:  nitroGLYCERIN DISSOLVE 1 TABLET UNDER THE TONGUE AS DIRECTED AND AS NEEDED FOR CHEST PAIN   omeprazole 40 MG capsule Commonly known as:  PRILOSEC TAKE 1 CAPSULE(40 MG) BY MOUTH TWICE DAILY BEFORE A MEAL   OXYGEN Take 2 L by mouth daily as needed. 2 l/m at bedtime and as needed for shortness of breath   predniSONE 5 MG tablet Commonly known as:  DELTASONE Take 1 1/2 tablets daily   SPIRIVA RESPIMAT 2.5 MCG/ACT Aers Generic drug:  Tiotropium Bromide Monohydrate INHALE 2 PUFFS BY MOUTH EVERY DAY   SYMBICORT 160-4.5 MCG/ACT inhaler Generic drug:  budesonide-formoterol INHALE 2 PUFFS BY MOUTH EVERY MORNING AND THEN ANOTHER 2 PUFFS ABOUT 12 HOURS LATER   thiamine 100 MG tablet Take 1 tablet (100 mg total) by mouth daily.   Vitamin D3  2000 units Tabs Take 2,000 Units by mouth daily.   XOPENEX HFA 45 MCG/ACT inhaler Generic drug:  levalbuterol INHALE 2 PUFFS BY MOUTH EVERY 4 HOURS AS NEEDED FOR WHEEZING OR SHORTNESS OF BREATH       LABS:  Office Visit on 10/27/2015  Component Date  Value Ref Range Status  . Sodium 10/27/2015 139  135 - 145 mEq/L Final  . Potassium 10/27/2015 4.2  3.5 - 5.1 mEq/L Final  . Chloride 10/27/2015 99  96 - 112 mEq/L Final  . CO2 10/27/2015 34* 19 - 32 mEq/L Final  . Glucose, Bld 10/27/2015 101* 70 - 99 mg/dL Final  . BUN 10/27/2015 19  6 - 23 mg/dL Final  . Creatinine, Ser 10/27/2015 0.95  0.40 - 1.50 mg/dL Final  . Calcium 10/27/2015 9.8  8.4 - 10.5 mg/dL Final  . GFR 10/27/2015 80.23  >60.00 mL/min Final  . Free T4 10/27/2015 0.80  0.60 - 1.60 ng/dL Final   Comment: Specimens from patients who are undergoing biotin therapy and /or ingesting biotin supplements may contain high levels of biotin.  The higher biotin concentration in these specimens interferes with this Free T4 assay.  Specimens that contain high levels  of biotin may cause false high results for this Free T4 assay.  Please interpret results in light of the total clinical presentation of the patient.       Review of Systems  He feels fatigued and easily tired, he thinks his muscles are week  Lab Results  Component Value Date   TSH 3.067 09/13/2015     PHYSICAL EXAM:  BP (!) 112/58 (BP Location: Left Arm)   Pulse 77   Temp 98.2 F (36.8 C)   Resp 16   Ht 5\' 7"  (1.702 m)   Wt 137 lb (62.1 kg)   SpO2 98%   BMI 21.46 kg/m   No orthostatic hypotension found today   ASSESSMENT:   History of orthostatic hypotension  Etiology of this is unclear Clinically did not have any symptoms suggestive of adrenal insufficiency and no objective evidence of this was found. With tapering his prednisone down to 10 mg he appears to be doing clinically fairly well However he tends to have low normal blood pressure readings  at home especially in the mornings and nonspecific feeling of lightheadedness frequently He has not taken any Florinef that was recommended with his systolic blood pressure not staying persistently under 100   Fatigue and malaise, relatively chronic and unclear of the etiology.  He had a low normal free T4 level, previously normal TSH but this was soon after his hospitalization     PLAN:    Trial of Florinef low-dose with 0.05 mg twice a week for orthostatic hypotension. If he feels subjectively better this may be continued and recently dose increased  He will continue taper his prednisone down and will go to 7.5 mg in about a week  Will also check his free T4 again   Patient Instructions  Florinef 1/2 tab twice a week at nite on wednesdays and [place] 10/27/2015, 9:33 PM

## 2015-10-28 NOTE — Progress Notes (Signed)
Please let patient know that the lab result is normal and no further action needed

## 2015-10-29 ENCOUNTER — Other Ambulatory Visit: Payer: Self-pay | Admitting: *Deleted

## 2015-10-29 NOTE — Patient Outreach (Signed)
[facility]) Care Management  10/29/2015  [name] Dec 06, 1929 [id number]   Weekly transition of care call placed to member, no answer.  HIPAA compliant voice message left.  Will await call back.  If no call back, will follow up next week.  Valente Jaleal, [place], MSN [place] [phone]

## 2015-11-03 ENCOUNTER — Other Ambulatory Visit: Payer: Self-pay | Admitting: *Deleted

## 2015-11-03 NOTE — Patient Outreach (Signed)
[place] [facility]) Care Management  11/03/2015  TAELIN SCATENA 05/28/1929 [id number]   Call placed to member in attempt to complete transition of care program, no answer.  HIPAA compliant voice message left, will await call back.  If no call back, will follow up next week.  Valente Haywood, [place], MSN [place] [phone]

## 2015-11-04 ENCOUNTER — Other Ambulatory Visit: Payer: Self-pay

## 2015-11-04 NOTE — Patient Outreach (Signed)
Late entry   10/28/15  QUESHAWN [name] January 02, 1930 [id number]  Subjective: [name] is an 40 YOM referred to [facility] for complete medication review due to polypharmacy via [name] Lottie, RN. Quay [name], Travis Student interviewed the patient over the phone.  Patient reports taking BP daily (sometimes more) and BPs range in the 130s. [name] reports complete adherence with his medications and marks it down in his phone so he remembers. He reports his pulse being in the 80s, and used to be in the 70s. He does not report financial difficulties with his medications at this time and I informed him that we could look for patient assistance programs if the situation arose. He reports not taking his hydralazine since his hospitalization in September.    Objective: Outpatient Encounter Prescriptions as of 11/04/2015  Medication Sig Note  . ALPRAZolam (XANAX) 0.25 MG tablet Take 0.25 mg by mouth 2 (two) times daily as needed for anxiety.   Marland Kitchen aspirin 81 MG tablet Take 81 mg by mouth daily.   Marland Kitchen atorvastatin (LIPITOR) 20 MG tablet Take 20 mg by mouth daily at 6 PM.   . Cholecalciferol (VITAMIN D3) 2000 units TABS Take 2,000 Units by mouth daily.   . finasteride (PROSCAR) 5 MG tablet Take 5 mg by mouth every morning.   . fludrocortisone (FLORINEF) 0.1 MG tablet Take half a tablet when blood pressure is below 100 standing in the morning   . fluticasone (FLONASE) 50 MCG/ACT nasal spray Place 2 sprays into both nostrils daily.   . folic acid (FOLVITE) 1 MG tablet Take 1 tablet (1 mg total) by mouth daily.   . hydrALAZINE (APRESOLINE) 25 MG tablet Take 1 tablet (25 mg total) by mouth every 12 (twelve) hours as needed (for BP XX123456 systolic or > 123XX123 diastolic). If your BP is 99991111 systolic you may take one dose of the hydralazine that you have at home.  But would not do this more than twice a day.   . metoprolol succinate (TOPROL XL) 25 MG 24 hr tablet TAKE 1 TABLET(25 MG) BY MOUTH DAILY AT NIGHT  09/24/2015: toprol was switched to nightime dose, started 9/14  . Multiple Vitamin (MULTIVITAMIN) tablet Take 1 tablet by mouth daily.   Marland Kitchen NASONEX 50 MCG/ACT nasal spray  10/06/2015: Received from: External Pharmacy  . NITROSTAT 0.4 MG SL tablet DISSOLVE 1 TABLET UNDER THE TONGUE AS DIRECTED AND AS NEEDED FOR CHEST PAIN   . omeprazole (PRILOSEC) 40 MG capsule TAKE 1 CAPSULE(40 MG) BY MOUTH TWICE DAILY BEFORE A MEAL   . OXYGEN Take 2 L by mouth daily as needed. 2 l/m at bedtime and as needed for shortness of breath   . predniSONE (DELTASONE) 5 MG tablet Take 1 1/2 tablets daily   . SPIRIVA RESPIMAT 2.5 MCG/ACT AERS INHALE 2 PUFFS BY MOUTH EVERY DAY   . SYMBICORT 160-4.5 MCG/ACT inhaler INHALE 2 PUFFS BY MOUTH EVERY MORNING AND THEN ANOTHER 2 PUFFS ABOUT 12 HOURS LATER   . thiamine 100 MG tablet Take 1 tablet (100 mg total) by mouth daily.   [name] HFA 45 MCG/ACT inhaler INHALE 2 PUFFS BY MOUTH EVERY 4 HOURS AS NEEDED FOR WHEEZING OR SHORTNESS OF BREATH    No facility-administered encounter medications on file as of 11/04/2015.    Assessment:  Drugs sorted by system:  Neurologic/Psychologic: Alprazolam  Cardiovascular: Aspirin, Atorvastatin,Metoprolol succinate, Nitrostat, Fludrocortisone  Pulmonary/Allergy: Fluticasone, Nasonex, Spiriva, Symbicort, Xopenex, Prednisone  Gastrointestinal: Omeprazole   Genitourinary:  Finasteride  Endocrine: none reported  Renal: none reported  Topical: none reported  Pain/musculoskeletal: none reported  Ocular: none reported  Miscellaneous: Vit D3, B complex supplement with folic acid and thiamine  Plan: 1.  No problems or drug interactions noted.   2.  I will close his case to pharmacy since the medication reconciliation is completed and no issues were found.  I am happy to assist in the future if any pharmacy issues arise.   Deanne Coffer, PharmD, [place] [phone]

## 2015-11-08 ENCOUNTER — Other Ambulatory Visit: Payer: Self-pay | Admitting: *Deleted

## 2015-11-08 ENCOUNTER — Encounter: Payer: Self-pay | Admitting: *Deleted

## 2015-11-08 NOTE — Patient Outreach (Signed)
[place]) Care Management  11/08/2015  MORROW FISS Apr 27, 1929 [id number]   Call placed in attempt to complete transition of care program, no answer.  HIPAA compliant voice message left.  This is the 3rd consecutive unsuccessful attempt to contact member.  Will send outreach letter.  If no response in 10 days will close case.  Valente Tyjai, [place], MSN [place] [phone]

## 2015-11-11 ENCOUNTER — Other Ambulatory Visit: Payer: Self-pay | Admitting: Internal Medicine

## 2015-11-15 DIAGNOSIS — L570 Actinic keratosis: Secondary | ICD-10-CM | POA: Diagnosis not present

## 2015-11-15 DIAGNOSIS — L219 Seborrheic dermatitis, unspecified: Secondary | ICD-10-CM | POA: Diagnosis not present

## 2015-11-22 ENCOUNTER — Encounter: Payer: Self-pay | Admitting: *Deleted

## 2015-11-22 ENCOUNTER — Other Ambulatory Visit: Payer: Self-pay | Admitting: *Deleted

## 2015-11-22 DIAGNOSIS — Z029 Encounter for administrative examinations, unspecified: Secondary | ICD-10-CM | POA: Diagnosis not present

## 2015-11-22 NOTE — Patient Outreach (Signed)
[facility]) Care Management  11/22/2015  [name] 20-Jun-1929 [id number]   Outreach letter sent on 10/30, no response.  Will send member and primary MD case closure letters.  Will notify care management assistant of care closure.  Valente Terri, [place], MSN [place] [phone]

## 2015-11-26 ENCOUNTER — Telehealth: Payer: Self-pay | Admitting: Endocrinology

## 2015-11-26 ENCOUNTER — Ambulatory Visit: Payer: Medicare Other | Admitting: Endocrinology

## 2015-11-26 NOTE — Telephone Encounter (Signed)
Pt would like clarification on how to take his meds now that he will be off the prednisone taper tomorrow

## 2015-11-29 NOTE — Telephone Encounter (Signed)
Please advise thanks.

## 2015-11-30 NOTE — Telephone Encounter (Signed)
Continue 5 mg prednisone in the morning and half tablet in the late afternoon as before until next visit

## 2015-11-30 NOTE — Telephone Encounter (Signed)
I contacted the patient and advised of message via voicemail. Requested a call back if the pateint would like to discuss further.

## 2015-12-09 ENCOUNTER — Encounter: Payer: Self-pay | Admitting: Endocrinology

## 2015-12-09 ENCOUNTER — Telehealth: Payer: Self-pay | Admitting: Pulmonary Disease

## 2015-12-09 ENCOUNTER — Encounter: Payer: Self-pay | Admitting: Pulmonary Disease

## 2015-12-09 ENCOUNTER — Ambulatory Visit (INDEPENDENT_AMBULATORY_CARE_PROVIDER_SITE_OTHER): Payer: Medicare Other | Admitting: Pulmonary Disease

## 2015-12-09 ENCOUNTER — Ambulatory Visit (INDEPENDENT_AMBULATORY_CARE_PROVIDER_SITE_OTHER): Payer: Medicare Other | Admitting: Endocrinology

## 2015-12-09 VITALS — BP 152/70 | HR 83 | Wt 138.8 lb

## 2015-12-09 DIAGNOSIS — I779 Disorder of arteries and arterioles, unspecified: Secondary | ICD-10-CM

## 2015-12-09 DIAGNOSIS — J9611 Chronic respiratory failure with hypoxia: Secondary | ICD-10-CM

## 2015-12-09 DIAGNOSIS — J449 Chronic obstructive pulmonary disease, unspecified: Secondary | ICD-10-CM | POA: Diagnosis not present

## 2015-12-09 DIAGNOSIS — I951 Orthostatic hypotension: Secondary | ICD-10-CM

## 2015-12-09 DIAGNOSIS — J9612 Chronic respiratory failure with hypercapnia: Secondary | ICD-10-CM

## 2015-12-09 NOTE — Assessment & Plan Note (Signed)
Patient has severe/very severe emphysema. Last PFT with an FEV1 of 36%. Diffusion capacity of 48%. (05/04/2005) Patient currently is on oxygen 2 L 24 7. In 2017, patient's COPD has been very difficult to control with frequent exacerbation requiring frequent office visits, prednisone, antibiotics. He is also now on oxygen 2 L 24/7. Prior to this, he was just on oxygen 2 L with exertion.  We started NIV Astral on this patient back in June. Since starting it, he has had issues tolerating it because the pressures too much. He usually wakes up early morning with too much pressure, unable to breathe, suffocating, uncomfortable.   We switched his noninvasive ventilator from astral to Trilogy and he is tolerating that better. Pressures are better tolerated. He feels some better using it but not completely better as he would want to. He is having mask issues making him wake up several times during the night. He has a full face mask. There are nights that he is not able to use the Trilogy and he is able to sleep throughout the night without waking up.  Plan : 1. I called up his [place]. I suggested to the patient that we try FitLife Total Face mask (Respironics) or Amara mask. Switching to nasal pillows or nasal mask might also be another option but if he does so, we might need to cut down the pressure so that the pressure  will not be too much. If he is able to tolerate the Trilogy with a new mask,  plan to continue using it. I will also plan on getting a repeat ABG if he is tolerating the new mask so we know if we still need to tweak the NIV pressure or not. 2. Cont NIV for now with 2L o2. 3. If he does not tolerate NIV, plan to d/c NIV alltogether.  4. Currently he is on Symbicort, 160/4.5, 2 puffs twice a day and Spiriva respimat. He uses prn Duoneb.  5. UTD with vaccines. Got flu vaccine.

## 2015-12-09 NOTE — Patient Instructions (Signed)
It was a pleasure taking care of you today!  You are diagnosed with Chronic Obstructive Pulmonary Disease or COPD and chronic hypoxemic respiratory failure.  Continue using your  noninvasive ventilator (Trilogy). We will contact your DME company for you to have another mask fitting session. Please let us know if you continue to have issues with your Trilogy.    Return to clinic in February 2018.

## 2015-12-09 NOTE — Assessment & Plan Note (Signed)
Patient on oxygen 2 L 24 7. At the start of 2017, he was just on 2 L when necessary. ABG done in 2012 showed CO2 of 48, unknown FiO2. ABG on 2L  (05/2015)  7.4/49/89 on 2L.   Plan to keep O2 sats > 88%.  If he ends up keeping the NIV (trilogy), I want to get a noc ox on 2L and NIV.

## 2015-12-09 NOTE — Progress Notes (Signed)
Subjective:    Patient ID: [name], male    DOB: 19-Dec-1929, 83 y.o.   MRN: [id number]  HPI  Pt is being seen regularly by MW for very severe/severe COPD, chronic hypoxemic resp failure.  Recently seen by MW abd was treated for aecopd. Was on higher pred but tapered down to usual dose.   Pt has had 2 sleep studies. Last study was 03/2005 and AHI was <1.   The last 6 mos, pt's COPD has been difficult to control. Pt with more frequent flare ups requiring higher prednisone +/- Abx. Pt has also been seeing/calling Dr. [name].   Also on 2L 24/7 the last 6 mos.   Gets 8-9hrs/night. No  No witnessed apneas. , gasping, choking.  Wakes up very tired.  Naps every now and then.  Occasional jerking of legs at sleep.   Follows with Cardiology. Sees Dr. Tamala Julian for CAD. CAD has been stable.    ROV 09/09/15 Patient returns to the office as follow-up on his severe COPD, chronic hypoxemic respiratory failure, NIV use. He usually follows with Dr. [name]. Last time he saw her was back in June. Since I last saw him, he has been busy with another diagnosis. He was found to have urinary bladder tumor. At that time, he was visiting his son in [place]. He ended up getting a biopsy. It was a limited biopsy because of his comorbidities. The thought of doing surgery for the procedure but it was too risky and that has been held off. He subsequently also followed up with [facility] for the urinary bladder tumor. With regards to his breathing, its overall the same. He states he is able to use his inhalers. Regarding the ventilator, he has had issues since starting to use it. He feels the pressure is too much. He ends up waking up early morning, very comfortable, suffocating, unable to tolerate the pressure. He has called his DME [facility] and they're supposed to follow up on him tomorrow. He uses oxygen with exertion, O2 saturation runs mid 90s. Has not been on antibiotics since last seen. Has nasal congestion and  postnasal drip.  ROV  12/09/15 Patient returns to the office as follow-up on his chronic hypoxemic respiratory failure and COPD. He usually sees Dr. [name] for his end-stage COPD. Last seen by Dr. [name] on September 25. Prior to that, I saw him and suggested trying nebulizer medicines. Nebulizer machine did not work for him. We switched him from an astral to a trilogy and he started using trilogy sometime in October. He feels the pressure is adequate but the mask is not comfortable. He has a full face mask. The mask gets dislodged frequently in the middle of the night causing him to wake up. Because of that, he is not able to sleep straight through the night. There are nights that he does not use that trilogy and he sleeps straight. He probably is more winded and sleepy during the days . He did not use the trilogy. Has not been admitted nor been on antibiotics since last seen. He is having issues with his blood pressure. Cardiology and endocrinology are managing that. He states his blood pressure is low at night and elevated during the daytime.   Review of Systems  Constitutional: Negative for fever and unexpected weight change.  HENT: Positive for congestion, postnasal drip and sinus pressure. Negative for dental problem, ear pain, nosebleeds, rhinorrhea, sneezing, sore throat and trouble swallowing.   Eyes: Negative.  Negative for  redness and itching.  Respiratory: Positive for shortness of breath. Negative for cough, chest tightness and wheezing.   Cardiovascular: Negative for palpitations and leg swelling.  Gastrointestinal: Negative for nausea and vomiting.  Endocrine: Negative.   Genitourinary: Negative.  Negative for dysuria.  Musculoskeletal: Positive for arthralgias and myalgias. Negative for joint swelling.  Skin: Negative.  Negative for rash.  Allergic/Immunologic: Negative.   Neurological: Positive for light-headedness. Negative for headaches.  Hematological: Bruises/bleeds easily.    Psychiatric/Behavioral: Negative.  Negative for dysphoric mood. The patient is not nervous/anxious.      Objective:   Physical Exam   Vitals:  Vitals:   12/09/15 1145  BP: 124/76  Pulse: 75  SpO2: 96%  Weight: 138 lb 3.2 oz (62.7 kg)  Height: 5\' 9"  (1.753 m)  We checked his O2 saturation on room air sitting: 94%.  Constitutional/General:  Pleasant, well-nourished, well-developed, not in any distress,  Comfortably seating.  Well kempt chronically ill. With his 2 L o2.   Body mass index is 20.41 kg/m. Wt Readings from Last 3 Encounters:  12/09/15 138 lb 3.2 oz (62.7 kg)  10/27/15 137 lb (62.1 kg)  10/18/15 136 lb (61.7 kg)    Neck circumference: 15 in  HEENT: Pupils equal and reactive to light and accommodation. Anicteric sclerae. Normal nasal mucosa.   No oral  lesions,  mouth clear,  oropharynx clear, no postnasal drip. (-) Oral thrush. No dental caries.  Airway - Mallampati class III  Neck: No masses. Midline trachea. No JVD, (-) LAD. (-) bruits appreciated.  Respiratory/Chest: Grossly normal chest. (-) deformity. (-) Accessory muscle use.  Symmetric expansion. (-) Tenderness on palpation.  Resonant on percussion.  Diminished BS on both lung fields. Clear to auscultation. (-) wheezing, crackles, rhonchi (-) egophony  Cardiovascular: Regular rate and  rhythm, heart sounds normal, no murmur or gallops, no peripheral edema  Gastrointestinal:  Normal bowel sounds. Soft, non-tender. No hepatosplenomegaly.  (-) masses.   Musculoskeletal:  Normal muscle tone. Normal gait.   Extremities: Grossly normal. (-) clubbing, cyanosis.  (-) edema  Skin: (-) rash,lesions seen.   Neurological/Psychiatric : alert, oriented to time, place, person. Normal mood and affect           Assessment & Plan:  Chronic hypoxemic respiratory failure ([place]) Patient on oxygen 2 L 24 7. At the start of 2017, he was just on 2 L when necessary. ABG done in 2012 showed CO2 of 48,  unknown FiO2. ABG on 2L  (05/2015)  7.4/49/89 on 2L.   Plan to keep O2 sats > 88%.  If he ends up keeping the NIV (trilogy), I want to get a noc ox on 2L and NIV.  Chronic respiratory failure with hypoxia and hypercapnia (HCC) Patient has severe/very severe emphysema. Last PFT with an FEV1 of 36%. Diffusion capacity of 48%. (05/04/2005) Patient currently is on oxygen 2 L 24 7. In 2017, patient's COPD has been very difficult to control with frequent exacerbation requiring frequent office visits, prednisone, antibiotics. He is also now on oxygen 2 L 24/7. Prior to this, he was just on oxygen 2 L with exertion.  We started NIV Astral on this patient back in June. Since starting it, he has had issues tolerating it because the pressures too much. He usually wakes up early morning with too much pressure, unable to breathe, suffocating, uncomfortable.   We switched his noninvasive ventilator from astral to Trilogy and he is tolerating that better. Pressures are better tolerated. He feels some better  using it but not completely better as he would want to. He is having mask issues making him wake up several times during the night. He has a full face mask. There are nights that he is not able to use the Trilogy and he is able to sleep throughout the night without waking up.  Plan : 1. I called up his [facility]. I suggested to the patient that we try [employer] Total Face mask (Respironics) or Amara mask. Switching to nasal pillows or nasal mask might also be another option but if he does so, we might need to cut down the pressure so that the pressure  will not be too much. If he is able to tolerate the Trilogy with a new mask,  plan to continue using it. I will also plan on getting a repeat ABG if he is tolerating the new mask so we know if we still need to tweak the NIV pressure or not. 2. Cont NIV for now with 2L o2. 3. If he does not tolerate NIV, plan to d/c NIV alltogether.  4. Currently he  is on Symbicort, 160/4.5, 2 puffs twice a day and Spiriva respimat. He uses prn Duoneb.  5. UTD with vaccines. Got flu vaccine.     COPD GOLD IV/ 02 dep /steroid dep Patient has severe/very severe emphysema. Last PFT with an FEV1 of 36%. Diffusion capacity of 48%. (05/04/2005) Patient currently is now on oxygen 2 L 24 7.  Cont symbicort 160/4.5 2P BID, spiriva daily, duoneb prn.  Cont O2 2L 24/7. Cont NIV (Trilogy)  Return to clinic in February.   I spent at least 25 minutes with the patient today and more than 50% was spent counseling the patient regarding disease and management and facilitating labs and medications.         Monica Becton, MD 12/09/2015, 12:39 PM [facility] Pager [phone] After 3 pm or if no answer, call [phone]

## 2015-12-09 NOTE — Telephone Encounter (Signed)
   Jasmine, can you pls fax my note from today to pt's [facility] in [place]?   Fax number is [phone].   We are trying on a different mask.   Thanks.  Monica Becton, MD 12/09/2015, 12:43 PM [facility] Pager [phone] After 3 pm or if no answer, call [phone]

## 2015-12-09 NOTE — Patient Instructions (Signed)
Take [name] 1/2 tab twice a week  On 12/5 stop taking 1/2 prednisone in pm

## 2015-12-09 NOTE — Assessment & Plan Note (Signed)
Patient has severe/very severe emphysema. Last PFT with an FEV1 of 36%. Diffusion capacity of 48%. (05/04/2005) Patient currently is now on oxygen 2 L 24 7.  Cont symbicort 160/4.5 2P BID, spiriva daily, duoneb prn.  Cont O2 2L 24/7. Cont NIV (Trilogy)

## 2015-12-09 NOTE — Progress Notes (Signed)
Patient ID: [name], male   DOB: October 11, 1929, 82 y.o.   MRN: [id number]              Chief complaint: Follow-up of low blood pressure  History of Present Illness:   The patient was felt to have adrenal insufficiency when he was admitted with orthostatic hypotension on 09/12/15. Previous symptoms were lightheadedness on standing up and not feeling good for about 2 weeks prior to this  Had no symptoms of  nausea,  decreased appetite or known electrolyte disturbance but had lost about 10-15 pounds of weight loss over the last few months for no apparent reason Patient had been on 5 mg of prednisone for about 8 months prior to this admission for his COPD   During his hospitalization he was treated with fluids and IV steroids and then discharged on prednisone 20 mg without any specific evaluation for cortisol levels or a Cortrosyn test.  Recent history: Patient monitors the blood pressure at home daily with an Omron meter   On his initial consultation he was told to try Florinef and blood pressure was below 123XX123 systolic standing up He says he does feel a little lightheaded and somewhat fuzzy headed especially when he is getting up out of bed in the morning  Usual blood pressure is about 138/70 when he is checking it in bed and usually not higher than this otherwise Blood pressure may tend to drop relatively more in the mornings when he is trying to get up but mostly over 123XX123 systolic and only about 4 times has been around 88 systolic with transient lightheadedness Twice he has taken Florinef when he felt blood pressure was relatively low and not coming out right away, this improved his lightheadedness and blood pressure  PREDNISONE dose: He is currently on 5 mg the morning and 2. 5 in the evening He had COPD flare early November and took 20 mg until 11/16 He does complain of fatigue and sleepiness   Wt Readings from Last 3 Encounters:  12/09/15 138 lb 12.8 oz (63 kg)  12/09/15 138 lb 3.2  oz (62.7 kg)  10/27/15 137 lb (62.1 kg)    Lab Results  Component Value Date   CREATININE 0.95 10/27/2015   BUN 19 10/27/2015   NA 139 10/27/2015   K 4.2 10/27/2015   CL 99 10/27/2015   CO2 34 (H) 10/27/2015      Past Medical History:  Diagnosis Date  . Asthma   . BPH (benign prostatic hyperplasia)    severe; s/p multiple biopsies  . CAD (coronary artery disease)   . Carotid artery occlusion   . Chronic rhinitis   . COPD (chronic obstructive pulmonary disease) (HCC)    2L [place] O2  . Hyperlipidemia   . Hypertension   . Peripheral vascular disease ([place])   . 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 Syncope and collapse   . Ureteral tumor 08/2015   had endoscopic procedure for evaluation, unable to reach for biopsy    Past Surgical History:  Procedure Laterality Date  . CATARACT EXTRACTION  11-20-11  . CORONARY ARTERY BYPASS GRAFT  2003  . LITHOTRIPSY    . PROSTATE BIOPSY  Oct. 2014    Family History  Problem Relation Age of Onset  . Heart disease Mother   . Breast cancer Mother   . Cancer Mother     Breast cancer  . Hypertension Mother   . Other Mother     AAA  and  Amputation  . Heart disease Father     Before age 5  . Heart attack Father   . Stroke Brother     x3, still living   . Peripheral vascular disease Brother   . Heart disease Brother   . Hyperlipidemia Brother   . Hypertension Brother   . Heart disease Brother   . Allergies Brother     Social History:  reports that he quit smoking about 33 years ago. His smoking use included Cigarettes. He has a 66.00 pack-year smoking history. He has never used smokeless tobacco. He reports that he drinks alcohol. He reports that he does not use drugs.  Allergies:  Allergies  Allergen Reactions  . Sulfonamide Derivatives Swelling    REACTION: facial/tongue swelling  . Cefdinir Diarrhea    REACTION: diarrhea  . Ciprofloxacin Itching and Rash    Red itchy hands  . Nitrofurantoin Swelling    Swollen hands  . Levaquin  [Levofloxacin] Itching and Rash      Medication List       Accurate as of 12/09/15  2:48 PM. Always use your most recent med list.          ALPRAZolam 0.25 MG tablet Commonly known as:  XANAX Take 0.25 mg by mouth 2 (two) times daily as needed for anxiety.   aspirin 81 MG tablet Take 81 mg by mouth daily.   atorvastatin 20 MG tablet Commonly known as:  LIPITOR Take 20 mg by mouth daily at 6 PM.   finasteride 5 MG tablet Commonly known as:  PROSCAR Take 5 mg by mouth every morning.   fludrocortisone 0.1 MG tablet Commonly known as:  FLORINEF Take half a tablet when blood pressure is below 100 standing in the morning   fluticasone 50 MCG/ACT nasal spray Commonly known as:  FLONASE Place 2 sprays into both nostrils daily.   folic acid 1 MG tablet Commonly known as:  FOLVITE Take 1 tablet (1 mg total) by mouth daily.   hydrALAZINE 25 MG tablet Commonly known as:  APRESOLINE Take 1 tablet (25 mg total) by mouth every 12 (twelve) hours as needed (for BP [id number] systolic or > 123XX123 diastolic). If your BP is 99991111 systolic you may take one dose of the hydralazine that you have at home.  But would not do this more than twice a day.   metoprolol succinate 25 MG 24 hr tablet Commonly known as:  TOPROL XL TAKE 1 TABLET(25 MG) BY MOUTH DAILY AT NIGHT   multivitamin tablet Take 1 tablet by mouth daily.   NASONEX 50 MCG/ACT nasal spray Generic drug:  mometasone   NITROSTAT 0.4 MG SL tablet Generic drug:  nitroGLYCERIN DISSOLVE 1 TABLET UNDER THE TONGUE AS DIRECTED AND AS NEEDED FOR CHEST PAIN   omeprazole 40 MG capsule Commonly known as:  PRILOSEC TAKE 1 CAPSULE(40 MG) BY MOUTH TWICE DAILY BEFORE A MEAL   OXYGEN Take 2 L by mouth daily as needed. 2 l/m at bedtime and as needed for shortness of breath   predniSONE 5 MG tablet Commonly known as:  DELTASONE Take 1 1/2 tablets daily   SPIRIVA RESPIMAT 2.5 MCG/ACT Aers Generic drug:  Tiotropium Bromide Monohydrate INHALE 2  PUFFS BY MOUTH EVERY DAY   SYMBICORT 160-4.5 MCG/ACT inhaler Generic drug:  budesonide-formoterol INHALE 2 PUFFS BY MOUTH EVERY MORNING AND THEN ANOTHER 2 PUFFS ABOUT 12 HOURS LATER   thiamine 100 MG tablet Take 1 tablet (100 mg total) by mouth daily.   Vitamin D3  2000 units Tabs Take 2,000 Units by mouth daily.   XOPENEX HFA 45 MCG/ACT inhaler Generic drug:  levalbuterol INHALE 2 PUFFS BY MOUTH EVERY 4 HOURS AS NEEDED FOR WHEEZING OR SHORTNESS OF BREATH       LABS:  No visits with results within 1 Week(s) from this visit.  Latest known visit with results is:  Office Visit on 10/27/2015  Component Date Value Ref Range Status  . Sodium 10/27/2015 139  135 - 145 mEq/L Final  . Potassium 10/27/2015 4.2  3.5 - 5.1 mEq/L Final  . Chloride 10/27/2015 99  96 - 112 mEq/L Final  . CO2 10/27/2015 34* 19 - 32 mEq/L Final  . Glucose, Bld 10/27/2015 101* 70 - 99 mg/dL Final  . BUN 10/27/2015 19  6 - 23 mg/dL Final  . Creatinine, Ser 10/27/2015 0.95  0.40 - 1.50 mg/dL Final  . Calcium 10/27/2015 9.8  8.4 - 10.5 mg/dL Final  . GFR 10/27/2015 80.23  >60.00 mL/min Final  . Free T4 10/27/2015 0.80  0.60 - 1.60 ng/dL Final   Comment: Specimens from patients who are undergoing biotin therapy and /or ingesting biotin supplements may contain high levels of biotin.  The higher biotin concentration in these specimens interferes with this Free T4 assay.  Specimens that contain high levels  of biotin may cause false high results for this Free T4 assay.  Please interpret results in light of the total clinical presentation of the patient.       Review of Systems  He feels fatigued and easily tired, he thinks his muscles are week Has had normal thyroid levels  Lab Results  Component Value Date   TSH 3.067 09/13/2015   TSH 3.44 04/15/2015   TSH 3.906 02/23/2010   FREET4 0.80 10/27/2015   FREET4 0.68 10/06/2015   FREET4 1.14 02/25/2008      PHYSICAL EXAM:  BP (!) 152/70   Pulse 83   Wt  138 lb 12.8 oz (63 kg)   SpO2 (!) 89%   BMI 20.50 kg/m   Repeat standing blood pressure 132/72  No edema  ASSESSMENT:   History of orthostatic hypotension  Etiology of this is unclear  He has had some symptomatic lightheadedness although he thinks his blood pressure is only transiently low normal at times when he stands up  He has not taken  Florinef that was recommended except on 2 occasions  Fatigue and Daytime somnolence, relatively chronic and unclear of the etiology.   He will need to discuss with PCP and pulmonologist    PLAN:    Trial of Florinef low-dose with 0.05 mg twice a week for orthostatic hypotension.  He will take this regardless of his blood pressure   He will taper his prednisone down and will go to 5 mg in about a week with stopping the evening dosage   There are no Patient Instructions on file for this visit.   Jenefer Woerner 12/09/2015, 2:48 PM

## 2015-12-11 ENCOUNTER — Other Ambulatory Visit: Payer: Self-pay | Admitting: Internal Medicine

## 2015-12-13 NOTE — Telephone Encounter (Signed)
Notes faxed 12/13/2015. Nothing further needed at this time.

## 2015-12-20 ENCOUNTER — Ambulatory Visit (INDEPENDENT_AMBULATORY_CARE_PROVIDER_SITE_OTHER): Payer: Medicare Other | Admitting: Internal Medicine

## 2015-12-20 ENCOUNTER — Encounter: Payer: Self-pay | Admitting: Internal Medicine

## 2015-12-20 VITALS — BP 120/68 | HR 74 | Ht 69.0 in | Wt 141.6 lb

## 2015-12-20 DIAGNOSIS — J9612 Chronic respiratory failure with hypercapnia: Secondary | ICD-10-CM | POA: Diagnosis not present

## 2015-12-20 DIAGNOSIS — J449 Chronic obstructive pulmonary disease, unspecified: Secondary | ICD-10-CM | POA: Diagnosis not present

## 2015-12-20 DIAGNOSIS — J9611 Chronic respiratory failure with hypoxia: Secondary | ICD-10-CM | POA: Diagnosis not present

## 2015-12-20 DIAGNOSIS — I779 Disorder of arteries and arterioles, unspecified: Secondary | ICD-10-CM

## 2015-12-20 NOTE — Assessment & Plan Note (Signed)
-   Start noct 02 at 2lpm 2008 and on 03/04/09 desat @ > 185 ft so rec wear with activtiy > rm to rm  - 02/15/2011   Walked RA x one lap @ 185 stopped due to  desat > corrected on 2lpm - 02/26/2014  Walked 2lpm  2 laps @ 185 ft each stopped due to  Sob/ sats 88% at nl pace  - 10/19/2014   Walked RA  2 laps @ 185 ft each stopped due to  End of study, slow pace,  sob  But no  desat   - HCO3  10/27/15 = 34   rx as of 12/20/2015  = 2lpm 24/7  / NIV per  by Dr Corrie Dandy  See comments under copd re approp use of xanax prn as per action plan

## 2015-12-20 NOTE — Patient Instructions (Addendum)
No change in medications  - ok to increase alprazolam to 0.25 mg three times a day as per med calendar     See Tammy NP w/in 4 weeks with all your medications, even over the counter meds, separated in two separate bags, the ones you take no matter what vs the ones you stop once you feel better and take only as needed when you feel you need them.   Tammy  will generate for you a new user friendly medication calendar that will put [place] all on the same page re: your medication use.     Without this process, it simply isn't possible to assure that we are providing  your outpatient care  with  the attention to detail we feel you deserve.   If we cannot assure that you're getting that kind of care,  then we cannot manage your problem effectively from this clinic.  Once you have seen Tammy and we are sure that we're all on the same page with your medication use she will arrange follow up with me.

## 2015-12-20 NOTE — Assessment & Plan Note (Signed)
-   PFTs 05/04/05 FEV1 36% ratio 28% with 29% improvement after bronchodilators DLCO 48%  - PFT's 03/26/08 30% ratio 34 with 14% improvement after bronchodilators DLC0 38 %  - Referred to rehab 12/19/2012 > completed March 2015  - changed to spiriva respimat 02/14/2013  - started daily prednisone 06/19/13 > improved only a little> tapered off mid July 2015 > ok to change to prn prednisone 08/27/2013  - flared off nasonex and gerd rx 09/21/2014 > resumed - 10/19/2014  p extensive coaching HFA effectiveness =    90%  - changed pred to ceiling of 20 / floor of 5 mg daily as of 10/19/2014 > changed to floor of 10 mg daily 10/04/2015  - PFT's  06/08/2015  FEV1 0.58 (22 % ) ratio 27  p No sign % improvement from saba p symb/spiriva prior to study with DLCO  9/9 % corrects to 27 % for alv volume    He has near endstage dz and yet is fully ambulatory- not clear at all to me that he's benefiting from NIV but may not be sleeping as well on it and fine with me to use xanax prn day and noct - half a dose daytime and full dose noct  In terms of prednisone, The goal with a chronic steroid dependent illness is always arriving at the lowest effective dose that controls the disease/symptoms and not accepting a set "formula" which is based on statistics or guidelines that don't always take into account patient  variability or the natural hx of the dz in every individual patient, which may well vary over time.  For now therefore I recommend the patient maintain  20 mg ceiling and 10 mg per day floor.  I had an extended discussion with the patient reviewing all relevant studies completed to date and  lasting 15 to 20 minutes of a 25 minute visit    Each maintenance medication was reviewed in detail including most importantly the difference between maintenance and prns and under what circumstances the prns are to be triggered using an action plan format that is not reflected in the computer generated alphabetically organized AVS  but trather by a customized med calendar that reflects the AVS meds with confirmed 100% correlation.   In addition, Please see AVS for unique instructions that I personally wrote and verbalized to the the pt in detail and then reviewed with pt  by my nurse highlighting any  changes in therapy recommended at today's visit to their plan of care.

## 2015-12-20 NOTE — Progress Notes (Signed)
Subjective:    Patient ID: [name], male    DOB: 11-12-1929   MRN: [id number]     Summary: Pulmonary/ f/u ov try astepro for pnds   Primary Provider/Referring Provider: [name]    Brief patient profile:  38  yowm last smoked 1983 with GOLD III COPD with some reversibility and 02 dep at hs and with activity    History of Present Illness   05/01/2011 f/u ov/[name] cc 50 ft outside, one aisle at [facility], whole store with 02. No cough and minimal need for saba Seen at [facility] rec change ipatropium neb  to atrovent > no change doe. rec ry tudorza one puff twice daily and stop atrovent and see how it effects your activity performance, if not benefit ok to change back to atrovent   10/04/2011 f/u ov/[name] did not bring med calendar cc  Loosing ground on tudorza so went to atrovent neb and seemed to help. rec Plan A = use automatically no matter what Symbicort followed by Atrovent each am and symbiocort again in 12 hours plus continue the atrovent though the day in between about every 4-6 hours Only use your albuterol (xopenex hfa -puffer-  is Plan B,  Nebulizer is plan C) .      12/18/2012 f/u ov/[name] re: copd/ 02  at bedtime and with exertion  Chief Complaint  Patient presents with  . Follow-up    Increased sob at all times x1 month.  using O2 more frequently   actually no sob at rest or at hs, wears 02 with activity and when he does wear it the sats are fine even when he has to stop due to sob rec Plan A= automatic  symbiocort and tudorza twice daily and ok to use your 02 more consistently with activity that you know makes you short of breath Plan B = backup > xopenex 2 every 4 if needed if can't catch your breath Plan C = xopenex neb every 4 hours if plan B not working, always try plan B first  Please see patient coordinator before you leave today  to schedule rehab  02/14/2013 f/u ov/[name] re:  Chief Complaint  Patient presents with  . Follow-up    Wants to make sure his medications  are correct.  Pt c/o increased SOB with exertion.  easily confused with details of care, only using hfa xopenex not the neb xopenex, times right before he goes to rehab and feels he's making slow progress.  >>changed Symbicort to [facility] and atrovent neb to spiriva respimat     04/25/2013 f/u ov/[name] re:  Chief Complaint  Patient presents with  . Acute Visit    Pt c/o increased DOE for the past 3 wks- worse for the past 4-5 days. He states that he is SOB with any exertion at all such as just raising up his arms. He also c/o nasal congestion for the past 3 months. He is using xopenex HFA at least twice per day.    incxrease doe assoc with prod of more mucus and nasal congestion  but better overnight and worse as day goes on, occ green mucus but mostly white No desats walking on 02 at rehab 2lpm rec Prednisone 10 mg take  4 each am x 2 days,   2 each am x 2 days,  1 each am x 2 days and stop  Follow med calendar    06/19/2013 f/u ov/[name] re: GOLD III COPD with some reversibility on dulera 200 2bid and  spriva respimat  Chief Complaint  Patient presents with  . Follow-up    Pt c/o increased DOE for the past month. He states he gets SOB with walking approx 50 ft.    using xopenex qid never neb Did get def transient  benefit from prednisone  >steroid challenge   07/17/2013 Follow up and Med review  Returns for follow up and med review . We reviewed  All his meds and organized them into a med calendar w/ pt education.  Last visit was started on prednisone 20 mg with taper down to 10 mg and hold. Patient says that his breathing has improved. We discussed steroid use, and he wishes to taper off of this due to potential side effects.  rec Follow med calendar closely and bring to each visit.  Decrease prednisone 10mg  1/2 daily for 1 week then 1/2 every other day for 1 week and then stop.  May use saline nasal spray and gel As needed  For congestion .    08/27/2013 f/u ov/[name] re: copd/ 02 dep  with sleep/ ex / on symbicort and spiriva  Chief Complaint  Patient presents with  . Follow-up    ROV : Pt c/o incr SOB and states that he has had a recent decrease in activity--does not feel like his stamina/endurance is where it needs to be.   Variable doe  seemed some better on prednisone - never sob at rest or sleeping  Wears 02 when sleeps at 2 and exercises at 2lpm on treadmill at 1.2 mph level, goes 5 min rest 1 min Has not tried pre treating with xopenex before he does the treadmill, never tried slow setting, never uses neb xopenex rec Try using your xopenex hfa  before planned walk on your treadmill to see what difference if any this makes Prevnar 13 given today Resume prednisone 10 mg x 2 daily when xopenx is not lasting 4 hours, then 1 daily x 5 days and stop      10/23/2013 f/u ov/[name] re: copd/02 dep on symb/spiriva/ xopenex prn plus back up pred short course Chief Complaint  Patient presents with  . Follow-up    SOB   feels prednisone helped a lot with sob and less need for saba Able do treadmill x 15 min x 1 mph no grade no  wheeze overt sinus or hb symptoms.   rec Try using your xopenex hfa  before planned walk on your treadmill to see what difference if any this makes prevnar 13 given today Resume prednisone 10 mg x 2 daily when xopenx is not lasting 4 hours, then 1 daily x 5 days and stop   02/26/2014 f/u ov/[name] re: GOLD III/ confused with action plan portion of med calendar  Chief Complaint  Patient presents with  . Follow-up    Pt c/o increased cough and SOB for the past 3 wks. His cough is worse in the am and is prod with white to light green sputum. He is using xopenex inhaler about 3-4 x per day and has not used neb.    treadmill x 15 min s stopping  x 1.2 mph flat/ on 02 2lpm with sats no lower than 93  Cough is day > noct, not flaring in am, no purulent sputum rec Goal is to keep sats over 90% with activity     06/01/2014 f/u ov/[name] re: COPD GOLD III/  symb/ spiriva respimat prednsione ? Dose / did not bring med calendar Chief Complaint  Patient presents  with  . Follow-up    Pt states his breathing is progressively worse since the last visit.    increased to 35 m on 02  1.2 mph, no tilt s stopping p 10 min to rest on 2lpm with adequate sats reported    some days  Prednisone 10 mg tapered but confused with instructions Drippy nose no better with flonase / clariton > has not tried 1st gen H1  Not much change using saba before ex  >>change claritin to chlortrimeton   06/15/2014 Follow up and Med review : COPD GOLD III/  Pt returns for follow up and med review  We reviewed all his meds and organized them into a med calendar with pt education  Appears to be taking meds correctly.  Last visit with copd flare , changed claritin to chlortabs but unable to tolerate during daytime Due to sleepiness.  Also to take 1/2 toprol for weakness, says he never had any episodes so went back on 1 whole tablet of toprol.  rec Follow med calendar closely and bring to each visit.  Please contact office for sooner follow up if symptoms do not improve or worsen or seek emergency care   Follow up Dr. [name]  In 3 months and As needed.    09/21/2014 f/u ov/Zamarian Scarano re: GOLD III  Chief Complaint  Patient presents with  . Follow-up    Pt c/o increased SOB with activity, occ chest tightness, prod cough early mornings with white mucus. Constant sinus drainage and nose bleed on 09/19/14.    Doe better until 3 weeks prior to OV  Then worse sob assoc with nasal congestion off nasonex  - note also stopped omeprazole "I don't have reflux" - see Dg  Es below rec Restart dymista one twice daily  Continue prednisone 20 mg daily until better  then taper  See calendar for specific medication instructions   10/19/2014  f/u ov/Maniyah Moller re: GOLD III / maint rx symbicort/ spiriva Chief Complaint  Patient presents with  . Follow-up    Pt c/o fatigue and increased chest congestion for  the past several wks. He has am cough with white sputum. He is using xopenex 2 x per day on average.    on 10/8 started pred 20 and zpak as per med calendar action plan  gxt flat x 53mph x 15 min and zero grade on 2lpm with sats mid 90s at home  >decrease pred 5mg    11/19/2014 NP Follow up : COPD GOLD III  Returns for follow up .  Says he had a flare 4 days ago with increased SOB, occasional dry cough, chest tightness. Sinus pressure and drainage. Denies fever, wheezing, nausea or vomiting. Started on Zpack and prednisone 20mg  yesterday .  Feels better   rec Follow med calendar closely and bring to each visit.  Finish Zpack as directed.  Taper prednisone and hold at 5mg  daily  Please contact office for sooner follow up if symptoms do not improve or worsen or seek emergency care     02/19/2015  f/u ov/[name] re: copd GOLD III symb/ spiriva  And 02 2lpm 24/7  Chief Complaint  Patient presents with  . Follow-up    Pt c/o increasing SOB even without exertion and some occasional cough. Pt is still on O2/2L. Pt also c/o fatigue, increased sleep, loss of appetite, irregular pulse. Pt states that when he wakes up he "doesn't feel like he will make it through the day".    on gxt x  2lpm continuous x 66mph on gxt x 15 m at flat grade s stopping  rec For significant swelling take furosemide 20 mg one daily as need as per your med calendar schedule    04/15/2015  f/u ov/[name] re: GOLD III/ hypercarbic/ maint rx symb and spiriva and pred floor of 10 mg and 02 lpm 24/7  Chief Complaint  Patient presents with  . Follow-up    Pt c/o increased nasal congestion and SOB x 2 wks. He is coughing more and producing minmal clear to white sputum.  He is using xopenex inhaler 2-3 x per day.        Wakes up feeling really lousy, mild ha and sluggish despite neg osa eval in past Not using flutter and quite congested in am as well  Doe = MMRC3 = can't walk 100 yards even at a slow pace at a flat grade s stopping due  to sob  Even on 02/ not using treadmill any more  >>cont current regimen   05/20/15 NPFollow up : GOLD III COPD /Hypercarbic RF on O2, chronic steroids at pred 10mg   Patient returns for a one-month follow-up. He is accompanied by his wife. Last visit. Patient continued to have progressive symptoms of shortness of breath, weakness and fatigue. Lab work showed a normal TSH, chronic hypercarbia with bicarbonate on the left) with 35. Normal BNP. And CBC . We reviewed all his medications and organized them into a medication calendar with patient education  appears to be taking his medications correctly. Complains of nasal stuffiness and postnasal drip.   rec Always use nasonex at least one puff at bedtime  use the flutter valve as much as possible  GERD diet  05/21/15 Eval by Dr Dedio/ rec trial of NIV     06/30/2015  f/u ov/[name] re: GOLD IV copd/ 02 dep hypercarbic/hypoxemic on symb 160/spiriva Chief Complaint  Patient presents with  . Acute Visit    Pt c/o increased DOE for the past month. He is also c/o unintended wt loss over the past 3-4 months.    most of his problem is fatigue, not limiting sob, and has yet to be able to use NIV for more than a few min to maybe an hour/ very inactive rec Goal with the 02 is keep sats over 90%  When breathing worse go ahead and double prednisone dose as per the med calendar    Admit date: 09/24/2015 Discharge date: 09/26/2015 Primary Cardiologist:Dr. Tamala Julian   Discharge Diagnoses:  Principal Problem:   Angina at rest [facility]) Active Problems:   Essential hypertension   COPD GOLD IV/ 02 dep /steroid dep   Chronic respiratory failure with hypoxia and hypercapnia (HCC)   CAD (coronary artery disease)   Chest pain   Orthostatic hypotension not corrected on prednisone 20 mg daily    10/04/2015  Post hosp f/u ov/[name] re:  Prednisone 20 mg daily  Plus symb 160 2bid/ spiriva respimat / flutter valve/ has still not started astral noct vent / 02 2lpm 24/7   Chief Complaint  Patient presents with  . Follow-up    Pt. wants to discuss being on Tiotropium and Pulmicort, breathing has not been better,been in the hospital twice, some coughing in the morning   main complaint is feeling lousy when stands up, not sob/ while  maintaining per cards  on pred 20 mg daily while still on multiple bp meds for which he is getting confused in terms of how to use.  No improvement in  orthostasis on 20 mg per day (looks like also tried florinef prior to the being maint on higher "floor" prednisone dose.  Was waking up feeling flushed with high BP but apparently never happened on NIV as never used it "too much else goingtlined above  rec Continue prednisone 20mg  daily until breathing better then 10 mg daily as per your med calendar  Put the new blood pressure medicines in pencil on your med calendar Go ahead and use the Astral as much as you can until see Dr Murlean Iba    12/20/2015  f/u ov/[name] re: copd IV/ 02 dep / hypercarbic/hypoxemic/ not using med calendar/ maint rx symb/spiriva Chief Complaint  Patient presents with  . Follow-up    Having trouble with tolerating NIV. He has been able to use the machine 5 nights in a row. He feels that his breathing is progressively getting worse.    worse as tapered off xanax "afraid of getting hooked"  Note at most was on 0.25 mg bid  Not much better with saba Admits sleeps very poorly on NIV and wakes up just as foggy headed with it as without it  No obvious day to day or daytime variability or assoc excess/ purulent sputum or mucus plugs or hemoptysis or cp or chest tightness, subjective wheeze or overt sinus or hb symptoms. No unusual exp hx or h/o childhood pna/ asthma or knowledge of premature birth.    Also denies any obvious fluctuation of symptoms with weather or environmental changes or other aggravating or alleviating factors except as outlined above   Current Medications, Allergies, Complete Past Medical History,  Past Surgical History, Family History, and Social History were reviewed in [employer] record.  ROS  The following are not active complaints unless bolded sore throat, dysphagia, dental problems, itching, sneezing,  nasal congestion or excess/ purulent secretions, ear ache,   fever, chills, sweats, unintended wt loss, classically pleuritic or exertional cp,  orthopnea pnd or leg swelling, presyncope, palpitations, abdominal pain, anorexia, nausea, vomiting, diarrhea  or change in bowel or bladder habits, change in stools or urine, dysuria,hematuria,  rash, arthralgias, visual complaints, headache, numbness, weakness or ataxia or problems with walking or coordination,  change in mood/affect or memory.                                 Past Medical History:  CHRONIC RHINITIS (ICD-472.0)  - Sinus ct March 03, 2010 > thickening only, no acute changes  CHRONIC RESPIRATORY FAILURE (ICD-518.83)  - 02 rx since 2008  COPD UNSPECIFIED (ICD-496)....................................................................[name]  - PFTs 05/04/05 FEV1 36% ratio 28% with 29% improvement after bronchodilators DLCO 48%  - PFT's 03/26/08 30% ratio 34 with 14% improvement after bronchodilators DLC0 38 %  - Start noct 02 at 2lpm 2008 and on 03/04/09 desat @ > 185 ft so rec wear with activtiy > rm to rm  - [facility] June 30, 2008 100% > confirmed March 04, 2009  ISCHEMIC HEART DISEASE (ICD-414.9)  HYPERTENSION (ICD-401.9)  HYPERLIPIDEMIA (ICD-272.4)  SPN RUL by cxr August 19, 2009  COMPLEX MED REGIMEN-Meds reviewed with pt education and computerized med calendar 11/22/09 03/10/2011  , 10/15/2012  07/17/2013 , 12/09/2013 , 06/15/2014 , 11/19/14 , 5.11.17          Objective:   Physical Exam  amb wm nad - frail / vital signs reviewed  - Note on arrival 02 sats  96% on   2lpm  wt 159 04/28/2010  > 10/14/2010 154  > 10/04/2011 150> 12/21/2011  150 > 149 01/29/2012 >154 04/05/2012 > 05/22/2012 148  > 09/30/2012  146 >>144> 10/15/2012 > 12/18/2012  145 > 148 02/14/2013 >148 03/14/2013 > 04/25/2013 148 > 06/19/2013 150 >>07/17/2013 > 08/27/2013 148 > 10/23/2013  148 >153 12/09/2013 > 02/26/2014  153 > 06/01/2014  152 > 157 06/15/2014 > 09/21/2014 146 > 10/19/2014 151 >>148 11/19/14 > 02/24/2015 148 > 06/30/2015    140 >  10/04/2015  136 > 12/20/2015   142    HEENT  mod nonspecific turbinate edema.  Oropharynx no thrush or excess pnd or cobblestoning.  No JVD or cervical adenopathy. Mild accessory muscle hypertrophy. Trachea midline, nl thryroid. Chest was hyperinflated by percussion with diminished breath sounds and moderate increased exp time without wheeze bilaterally .   Regular rate and rhythm without murmur gallop or rub or increase P2 -  trace  bilateral lower ext pitting edema R > L .   Abd: no hsm, nl excursion. Ext warm without cyanosis or clubbing.          DgEs  04/28/14 Mild gastroesophageal reflux. No hiatal hernia.

## 2015-12-23 ENCOUNTER — Encounter: Payer: Self-pay | Admitting: Family

## 2015-12-29 ENCOUNTER — Encounter: Payer: Self-pay | Admitting: Family

## 2015-12-29 ENCOUNTER — Ambulatory Visit (HOSPITAL_COMMUNITY)
Admission: RE | Admit: 2015-12-29 | Discharge: 2015-12-29 | Disposition: A | Discharge status: Home / Self Care | Payer: Medicare Other | Source: Ambulatory Visit | Attending: Family | Admitting: Family

## 2015-12-29 ENCOUNTER — Ambulatory Visit (INDEPENDENT_AMBULATORY_CARE_PROVIDER_SITE_OTHER): Payer: Medicare Other | Admitting: Family

## 2015-12-29 VITALS — BP 114/62 | HR 72 | Temp 97.6°F | Resp 18 | Ht 69.0 in | Wt 138.2 lb

## 2015-12-29 DIAGNOSIS — J449 Chronic obstructive pulmonary disease, unspecified: Secondary | ICD-10-CM | POA: Diagnosis not present

## 2015-12-29 DIAGNOSIS — I779 Disorder of arteries and arterioles, unspecified: Secondary | ICD-10-CM

## 2015-12-29 DIAGNOSIS — Z87891 Personal history of nicotine dependence: Secondary | ICD-10-CM | POA: Diagnosis not present

## 2015-12-29 DIAGNOSIS — R252 Cramp and spasm: Secondary | ICD-10-CM | POA: Diagnosis not present

## 2015-12-29 DIAGNOSIS — R0981 Nasal congestion: Secondary | ICD-10-CM | POA: Diagnosis not present

## 2015-12-29 DIAGNOSIS — R1084 Generalized abdominal pain: Secondary | ICD-10-CM

## 2015-12-29 DIAGNOSIS — I7789 Other specified disorders of arteries and arterioles: Secondary | ICD-10-CM | POA: Insufficient documentation

## 2015-12-29 DIAGNOSIS — F419 Anxiety disorder, unspecified: Secondary | ICD-10-CM | POA: Diagnosis not present

## 2015-12-29 DIAGNOSIS — I6523 Occlusion and stenosis of bilateral carotid arteries: Secondary | ICD-10-CM

## 2015-12-29 NOTE — Progress Notes (Signed)
[facility] [place] HISTORY AND PHYSICAL   MRN : [id number]  History of Present Illness:   [name] is a 82 y.o. male patient of Dr. Scot Dock seen for follow up of known extracranial carotid artery stenosis and PAOD.  He was initially referred by his cardiologist for monitoring of his ICA's. Pt had a 2003 CABG x 4 vessels. He uses 2 liters portable O2 when walking and sleeping, has severe COPD. Pt was evaluated by a gastroenterologist re RUQ intermittent pain, Prilosec has helped.   He has not had previous carotid artery intervention.  He denies any history of TIA or stroke symptom. Specifically he denies a history of amaurosis fugax or monocular blindness, unilateral facial drooping, hemiplegia, or receptive or expressive aphasia.   Arms feel weak, burning, and tingling; this is ongoing. He does not know of any c-spine issues.  He feels tired and weak, this is getting worse, but his pulmonologist and cardiologist told him that his cardiac and lung status are stable.  He also adds that he has decreased his alprazolam use, and will then start Zoloft.   He walks most days of the week on his treadmill for 22 minutes, is limited by dyspnea. He has seen a neurologist for dizziness, states he was diagnosed with paroxysmal vertigo. He has ankle swelling later in the day, left worse than right, no ankle swelling in the morning, he has knee high compression hose, advised to donn before he gets OOB.  Weight on 05/14/13 was 147 lb, 138 lb today. He reports occasional post prandial abdominal pain.   Pt Diabetic: No Pt smoker: former smoker, quit in 1983  Pt meds include: Statin : Yes ASA: Yes Other anticoagulants/antiplatelets: no   Current Outpatient Prescriptions  Medication Sig Dispense Refill  . ALPRAZolam (XANAX) 0.25 MG tablet Take 0.25 mg by mouth 2 (two) times daily as needed for anxiety. Takes 1/2 tablet two times daily.    Marland Kitchen aspirin 81 MG tablet  Take 81 mg by mouth daily.    Marland Kitchen atorvastatin (LIPITOR) 20 MG tablet Take 20 mg by mouth daily at 6 PM.    . Cholecalciferol (VITAMIN D3) 2000 units TABS Take 2,000 Units by mouth daily.    . finasteride (PROSCAR) 5 MG tablet Take 5 mg by mouth every morning.    . fludrocortisone (FLORINEF) 0.1 MG tablet Take half a tablet when blood pressure is below 100 standing in the morning 15 tablet 3  . fluticasone (FLONASE) 50 MCG/ACT nasal spray Place 2 sprays into both nostrils daily.    . metoprolol succinate (TOPROL XL) 25 MG 24 hr tablet TAKE 1 TABLET(25 MG) BY MOUTH DAILY AT NIGHT 30 tablet 10  . Multiple Vitamin (MULTIVITAMIN) tablet Take 1 tablet by mouth daily.    Marland Kitchen NITROSTAT 0.4 MG SL tablet DISSOLVE 1 TABLET UNDER THE TONGUE AS DIRECTED AND AS NEEDED FOR CHEST PAIN 25 tablet 5  . omeprazole (PRILOSEC) 40 MG capsule TAKE 1 CAPSULE(40 MG) BY MOUTH TWICE DAILY BEFORE A MEAL (Patient taking differently: takes on prn basis) 60 capsule 5  . OXYGEN Take 2 L by mouth daily as needed. 2 l/m at bedtime and as needed for shortness of breath    . predniSONE (DELTASONE) 10 MG tablet Take 5 mg by mouth daily with breakfast.    . SPIRIVA RESPIMAT 2.5 MCG/ACT AERS INHALE 2 PUFFS BY MOUTH EVERY DAY 1 Inhaler 11  . SYMBICORT 160-4.5 MCG/ACT inhaler INHALE 2 PUFFS BY MOUTH EVERY  MORNING AND THEN ANOTHER 2 PUFFS ABOUT 12 HOURS LATER 10.2 g 11  . XOPENEX HFA 45 MCG/ACT inhaler INHALE 2 PUFFS BY MOUTH EVERY 4 HOURS AS NEEDED FOR WHEEZING OR SHORTNESS OF BREATH 15 g 2  . folic acid (FOLVITE) 1 MG tablet Take 1 tablet (1 mg total) by mouth daily. (Patient not taking: Reported on 12/29/2015) 30 tablet 0  . hydrALAZINE (APRESOLINE) 25 MG tablet Take 1 tablet (25 mg total) by mouth every 12 (twelve) hours as needed (for BP XX123456 systolic or > 123XX123 diastolic). If your BP is 99991111 systolic you may take one dose of the hydralazine that you have at home.  But would not do this more than twice a day. (Patient not taking: Reported on  12/29/2015)    . thiamine 100 MG tablet Take 1 tablet (100 mg total) by mouth daily. (Patient not taking: Reported on 12/29/2015) 30 tablet 0   No current facility-administered medications for this visit.     Past Medical History:  Diagnosis Date  . Asthma   . BPH (benign prostatic hyperplasia)    severe; s/p multiple biopsies  . CAD (coronary artery disease)   . Carotid artery occlusion   . Chronic rhinitis   . COPD (chronic obstructive pulmonary disease) (HCC)    2L [place] O2  . Hyperlipidemia   . Hypertension   . Peripheral vascular disease ([place])   . 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 Syncope and collapse   . Ureteral tumor 08/2015   had endoscopic procedure for evaluation, unable to reach for biopsy    Social History Social History  Substance Use Topics  . Smoking status: Former Smoker    Packs/day: 2.00    Years: 33.00    Types: Cigarettes    Quit date: 01/09/1982  . Smokeless tobacco: Never Used  . Alcohol use 0.0 oz/week     Comment: 1.5-3 oz daily in the past, minimal use in the last few months    Family History Family History  Problem Relation Age of Onset  . Heart disease Mother   . Breast cancer Mother   . Cancer Mother     Breast cancer  . Hypertension Mother   . Other Mother     AAA  and   Amputation  . Heart disease Father     Before age 46  . Heart attack Father   . Stroke Brother     x3, still living   . Peripheral vascular disease Brother   . Heart disease Brother   . Hyperlipidemia Brother   . Hypertension Brother   . Heart disease Brother   . Allergies Brother     Surgical History Past Surgical History:  Procedure Laterality Date  . CATARACT EXTRACTION  11-20-11  . CORONARY ARTERY BYPASS GRAFT  2003  . LITHOTRIPSY    . PROSTATE BIOPSY  Oct. 2014    Allergies  Allergen Reactions  . Sulfonamide Derivatives Swelling    REACTION: facial/tongue swelling  . Cefdinir Diarrhea    REACTION: diarrhea  . Ciprofloxacin Itching and Rash    Red itchy  hands  . Nitrofurantoin Swelling    Swollen hands  . Levaquin [Levofloxacin] Itching and Rash    Current Outpatient Prescriptions  Medication Sig Dispense Refill  . ALPRAZolam (XANAX) 0.25 MG tablet Take 0.25 mg by mouth 2 (two) times daily as needed for anxiety. Takes 1/2 tablet two times daily.    Marland Kitchen aspirin 81 MG tablet Take 81 mg by mouth  daily.    . atorvastatin (LIPITOR) 20 MG tablet Take 20 mg by mouth daily at 6 PM.    . Cholecalciferol (VITAMIN D3) 2000 units TABS Take 2,000 Units by mouth daily.    . finasteride (PROSCAR) 5 MG tablet Take 5 mg by mouth every morning.    . fludrocortisone (FLORINEF) 0.1 MG tablet Take half a tablet when blood pressure is below 100 standing in the morning 15 tablet 3  . fluticasone (FLONASE) 50 MCG/ACT nasal spray Place 2 sprays into both nostrils daily.    . metoprolol succinate (TOPROL XL) 25 MG 24 hr tablet TAKE 1 TABLET(25 MG) BY MOUTH DAILY AT NIGHT 30 tablet 10  . Multiple Vitamin (MULTIVITAMIN) tablet Take 1 tablet by mouth daily.    Marland Kitchen NITROSTAT 0.4 MG SL tablet DISSOLVE 1 TABLET UNDER THE TONGUE AS DIRECTED AND AS NEEDED FOR CHEST PAIN 25 tablet 5  . omeprazole (PRILOSEC) 40 MG capsule TAKE 1 CAPSULE(40 MG) BY MOUTH TWICE DAILY BEFORE A MEAL (Patient taking differently: takes on prn basis) 60 capsule 5  . OXYGEN Take 2 L by mouth daily as needed. 2 l/m at bedtime and as needed for shortness of breath    . predniSONE (DELTASONE) 10 MG tablet Take 5 mg by mouth daily with breakfast.    . SPIRIVA RESPIMAT 2.5 MCG/ACT AERS INHALE 2 PUFFS BY MOUTH EVERY DAY 1 Inhaler 11  . SYMBICORT 160-4.5 MCG/ACT inhaler INHALE 2 PUFFS BY MOUTH EVERY MORNING AND THEN ANOTHER 2 PUFFS ABOUT 12 HOURS LATER 10.2 g 11  . XOPENEX HFA 45 MCG/ACT inhaler INHALE 2 PUFFS BY MOUTH EVERY 4 HOURS AS NEEDED FOR WHEEZING OR SHORTNESS OF BREATH 15 g 2  . folic acid (FOLVITE) 1 MG tablet Take 1 tablet (1 mg total) by mouth daily. (Patient not taking: Reported on 12/29/2015) 30  tablet 0  . hydrALAZINE (APRESOLINE) 25 MG tablet Take 1 tablet (25 mg total) by mouth every 12 (twelve) hours as needed (for BP XX123456 systolic or > 123XX123 diastolic). If your BP is 99991111 systolic you may take one dose of the hydralazine that you have at home.  But would not do this more than twice a day. (Patient not taking: Reported on 12/29/2015)    . thiamine 100 MG tablet Take 1 tablet (100 mg total) by mouth daily. (Patient not taking: Reported on 12/29/2015) 30 tablet 0   No current facility-administered medications for this visit.      REVIEW OF SYSTEMS: See HPI for pertinent positives and negatives.  Physical Examination Vitals:   12/29/15 1225  BP: 114/62  Pulse: 72  Resp: 18  Temp: 97.6 F (36.4 C)  TempSrc: Oral  SpO2: 98%  Weight: 138 lb 3.2 oz (62.7 kg)  Height: 5\' 9"  (1.753 m)   Body mass index is 20.41 kg/m.  General: WDWN thin male in NAD GAIT: normal Eyes: PERRLA Pulmonary: Mildly-labored at rest, limited air movement in all fields, no rales, no rhonchi, & no wheezing. He is using his portable oxygen dispenser, 2 L/[place]  Cardiac: regular rhythm, no detected murmur.  VASCULAR EXAM  Left Right  Carotid Bruits Negative Negative  RADIAL PULSE 2+ palpable 2+ palpable  AORTA Not palpable N/A  FEMORAL 1+ palpable 1+ palpable  POPLITEAL Not palpable Not palpable  POSTERIOR TIBIAL Not palpable Not palpable  DORSALIS PEDIS Not palpable Not palpable     Gastrointestinal: soft, nontender, BS WNL, no r/g, no palpable masses.  Musculoskeletal: Age appropriate muscle atrophy/wasting, bu is  also thin. M/S 4/5 throughout, Extremities without ischemic changes. 1+pitting edema in right ankle, 2+ in left ankle.  Neurologic: A&O X 3; Appropriate Affect, Speech is normal CN 2-12 intact except has some hearing loss, Motor exam as listed  above     ASSESSMENT:  [name] is a 82 y.o. male who has no history of stroke or TIA.  He has known peripheral artery occlusive disease. His walking seems limited by his dyspnea and overall lowered functional status. He does not seem to have claudication symptoms per se, he develops generalized weakness after walking 18 minutes on his treadmill. He uses supplemental oxygen via nasal canula continuously. He has no signs of ischemia in his feet/legs.  He has had gradual weight loss over the last couple of years, 138# today, 147 # on 05/14/13. He has post prandial abdominal pain, see Plan.  DATA  ABI's today indicate non compressible vessels in right ankle with monophasic waveforms, TBI is 0.32, improved from 0.25 on 06-23-15. Left ankle with evidence of moderate arterial occlusive disease at 0.55 compared to 0.61 on 06-23-15, monophasic waveforms, TBI is 0.43 which has improved from 0.28 on 06-23-15.  06-23-15 carotid Duplex suggests 40 - 59% right internal carotid artery stenosis, higher end of range. Less than 40% left internal carotid artery stenosis. No significant change since exam of 06/18/2014.   PLAN:   Based on today's exam and non-invasive vascular lab results, the patient will follow up in 6 months with the following tests: ABI's, carotid duplex, and mesenteric artery duplex. I discussed in depth with the patient the nature of atherosclerosis, and emphasized the importance of maximal medical management including strict control of blood pressure, blood glucose, and lipid levels, obtaining regular exercise, and cessation of smoking.  The patient is aware that without maximal medical management the underlying atherosclerotic disease process will progress, limiting the benefit of any interventions.  The patient was given information about stroke prevention and what symptoms should prompt the patient to seek immediate medical care.  The patient was given information about PAD  including signs, symptoms, treatment, what symptoms should prompt the patient to seek immediate medical care, and risk reduction measures to take. Thank you for allowing [place] to participate in this patient's care.  Clemon Chambers, RN, MSN, FNP-C [facility] Office: [phone]  Clinic MD: [facility] 12/29/2015 12:48 PM

## 2015-12-29 NOTE — Patient Instructions (Addendum)
Peripheral Vascular Disease Peripheral vascular disease (PVD) is a disease of the blood vessels that are not part of your heart and brain. A simple term for PVD is poor circulation. In most cases, PVD narrows the blood vessels that carry blood from your heart to the rest of your body. This can result in a decreased supply of blood to your arms, legs, and internal organs, like your stomach or kidneys. However, it most often affects a person's lower legs and feet. There are two types of PVD.  Organic PVD. This is the more common type. It is caused by damage to the structure of blood vessels.  Functional PVD. This is caused by conditions that make blood vessels contract and tighten (spasm). Without treatment, PVD tends to get worse over time. PVD can also lead to acute ischemic limb. This is when an arm or limb suddenly has trouble getting enough blood. This is a medical emergency. Follow these instructions at home:  Take medicines only as told by your doctor.  Do not use any tobacco products, including cigarettes, chewing tobacco, or electronic cigarettes. If you need help quitting, ask your doctor.  Lose weight if you are overweight, and maintain a healthy weight as told by your doctor.  Eat a diet that is low in fat and cholesterol. If you need help, ask your doctor.  Exercise regularly. Ask your doctor for some good activities for you.  Take good care of your feet.  Wear comfortable shoes that fit well.  Check your feet often for any cuts or sores. Contact a doctor if:  You have cramps in your legs while walking.  You have leg pain when you are at rest.  You have coldness in a leg or foot.  Your skin changes.  You are unable to get or have an erection (erectile dysfunction).  You have cuts or sores on your feet that are not healing. Get help right away if:  Your arm or leg turns cold and blue.  Your arms or legs become red, warm, swollen, painful, or numb.  You have  chest pain or trouble breathing.  You suddenly have weakness in your face, arm, or leg.  You become very confused or you cannot speak.  You suddenly have a very bad headache.  You suddenly cannot see. This information is not intended to replace advice given to you by your health care provider. Make sure you discuss any questions you have with your health care provider. Document Released: 03/22/2009 Document Revised: 06/03/2015 Document Reviewed: 06/05/2013 Elsevier Interactive Patient Education  2017 [employer].      Stroke Prevention Some medical conditions and behaviors are associated with an increased chance of having a stroke. You may prevent a stroke by making healthy choices and managing medical conditions. How can I reduce my risk of having a stroke?  Stay physically active. Get at least 30 minutes of activity on most or all days.  Do not smoke. It may also be helpful to avoid exposure to secondhand smoke.  Limit alcohol use. Moderate alcohol use is considered to be:  No more than 2 drinks per day for men.  No more than 1 drink per day for nonpregnant women.  Eat healthy foods. This involves:  Eating 5 or more servings of fruits and vegetables a day.  Making dietary changes that address high blood pressure (hypertension), high cholesterol, diabetes, or obesity.  Manage your cholesterol levels.  Making food choices that are high in fiber and low in  saturated fat, trans fat, and cholesterol may control cholesterol levels.  Take any prescribed medicines to control cholesterol as directed by your health care provider.  Manage your diabetes.  Controlling your carbohydrate and sugar intake is recommended to manage diabetes.  Take any prescribed medicines to control diabetes as directed by your health care provider.  Control your hypertension.  Making food choices that are low in salt (sodium), saturated fat, trans fat, and cholesterol is recommended to manage  hypertension.  Ask your health care provider if you need treatment to lower your blood pressure. Take any prescribed medicines to control hypertension as directed by your health care provider.  If you are 55-48 years of age, have your blood pressure checked every 3-5 years. If you are 52 years of age or older, have your blood pressure checked every year.  Maintain a healthy weight.  Reducing calorie intake and making food choices that are low in sodium, saturated fat, trans fat, and cholesterol are recommended to manage weight.  Stop drug abuse.  Avoid taking birth control pills.  Talk to your health care provider about the risks of taking birth control pills if you are over 10 years old, smoke, get migraines, or have ever had a blood clot.  Get evaluated for sleep disorders (sleep apnea).  Talk to your health care provider about getting a sleep evaluation if you snore a lot or have excessive sleepiness.  Take medicines only as directed by your health care provider.  For some people, aspirin or blood thinners (anticoagulants) are helpful in reducing the risk of forming abnormal blood clots that can lead to stroke. If you have the irregular heart rhythm of atrial fibrillation, you should be on a blood thinner unless there is a good reason you cannot take them.  Understand all your medicine instructions.  Make sure that other conditions (such as anemia or atherosclerosis) are addressed. Get help right away if:  You have sudden weakness or numbness of the face, arm, or leg, especially on one side of the body.  Your face or eyelid droops to one side.  You have sudden confusion.  You have trouble speaking (aphasia) or understanding.  You have sudden trouble seeing in one or both eyes.  You have sudden trouble walking.  You have dizziness.  You have a loss of balance or coordination.  You have a sudden, severe headache with no known cause.  You have new chest pain or an  irregular heartbeat. Any of these symptoms may represent a serious problem that is an emergency. Do not wait to see if the symptoms will go away. Get medical help at once. Call your local emergency services (911 in [place] Do not drive yourself to the hospital.  This information is not intended to replace advice given to you by your health care provider. Make sure you discuss any questions you have with your health care provider. Document Released: 02/03/2004 Document Revised: 06/03/2015 Document Reviewed: 06/28/2012 Elsevier Interactive Patient Education  2017 [place].      Before your next abdominal ultrasound:  Take two Extra-Strength Gas-X capsules at bedtime the night before the test. Take another two Extra-Strength Gas-X capsules 3 hours before the test.

## 2016-01-07 DIAGNOSIS — H00021 Hordeolum internum right upper eyelid: Secondary | ICD-10-CM | POA: Diagnosis not present

## 2016-01-17 ENCOUNTER — Ambulatory Visit (INDEPENDENT_AMBULATORY_CARE_PROVIDER_SITE_OTHER): Payer: Medicare Other | Admitting: Adult Health

## 2016-01-17 ENCOUNTER — Encounter: Payer: Self-pay | Admitting: Adult Health

## 2016-01-17 DIAGNOSIS — J9611 Chronic respiratory failure with hypoxia: Secondary | ICD-10-CM

## 2016-01-17 DIAGNOSIS — J9612 Chronic respiratory failure with hypercapnia: Secondary | ICD-10-CM | POA: Diagnosis not present

## 2016-01-17 DIAGNOSIS — J449 Chronic obstructive pulmonary disease, unspecified: Secondary | ICD-10-CM | POA: Diagnosis not present

## 2016-01-17 NOTE — Progress Notes (Signed)
Chart and office note reviewed in detail  > agree with a/p as outlined    

## 2016-01-17 NOTE — Patient Instructions (Addendum)
Zpack take as directed.  Prednisone taper over next week.  Mucinex DM Twice daily  As needed  Cough/congestion  Saline nasal rinses As needed   Continue on Trilogy At bedtime  .  Follow med calendar closely and bring to each visit.  Follow up with Dr. [name]  In 3 months and As needed   Please contact office for sooner follow up if symptoms do not improve or worsen or seek emergency care

## 2016-01-17 NOTE — Assessment & Plan Note (Signed)
Cont on o2 2l/m  Cont on Trilogy At bedtime   follow up Dr. Corrie Dandy in 1 month and As needed

## 2016-01-17 NOTE — Progress Notes (Signed)
@Patient  ID: [name], male    DOB: November 28, 1929, 83 y.o.   MRN: [id number]  Chief Complaint  Patient presents with  . Follow-up    COPD /Med review     Referring provider: [name], MD  HPI: 83 yo male former smoker (1983) followed for GOLD III COPD w/ asthma component , O2 dependent RF with act and At bedtime     TEST /Events  PFTs 05/04/05 FEV1 36% ratio 28% with 29% improvement after bronchodilators DLCO 48%  - PFT's 03/26/08 30% ratio 34 with 14% improvement after bronchodilators DLC0 38 %  - Referred to rehab 12/19/2012 > completed March 2015  - changed to spiriva respimat 02/14/2013  - started daily prednisone 06/19/13 > improved only a little> tapered off mid July 2015 > ok to change to prn prednisone 08/27/2013  - flared off nasonex and gerd rx 09/21/2014 > resumed - 10/19/2014  p extensive coaching HFA effectiveness =    90%  - changed pred to ceiling of 20 / floor of 5 mg daily as of 10/19/2014 > changed to floor of 10 mg daily 10/04/2015  - PFT's  06/08/2015  FEV1 0.58 (22 % ) ratio 27  p No sign % improvement from saba p symb/spiriva prior to study with DLCO  9/9 % corrects to 27 % for alv volume    Start noct 02 at 2lpm 2008 and on 03/04/09 desat @ > 185 ft so rec wear with activtiy > rm to rm  - 02/15/2011   Walked RA x one lap @ 185 stopped due to  desat > corrected on 2lpm - 02/26/2014  Walked 2lpm  2 laps @ 185 ft each stopped due to  Sob/ sats 88% at nl pace  - 10/19/2014   Walked RA  2 laps @ 185 ft each stopped due to  End of study, slow pace,  sob  But no  desat   - HCO3  10/27/15 = 34  01/17/2016 Follow up : COPD /O2 dependent RF  Pt returns for 1 month follow up .  Says he has been having more sinus congestion , drainage , cough and congestion for last week.  Denies chest pain , orthopnea, fever.   We reviewed all his meds and organized them into a med calendar with pt education . Appears to be taking correctly .   Has anxiety , taking xanax 0.25mg  Twice  daily  . Did not increase dose last ov, did not want to take more.  Also has not started zoloft yet (started by PCP but wants to wait )   Has hypercarbic RF on Trilogy At bedtime  . Followed by Dr. Corrie Dandy . Says he wears it all night .  Has not noticed much change in breathing.   Allergies  Allergen Reactions  . Sulfonamide Derivatives Swelling    REACTION: facial/tongue swelling  . Cefdinir Diarrhea    REACTION: diarrhea  . Ciprofloxacin Itching and Rash    Red itchy hands  . Nitrofurantoin Swelling    Swollen hands  . Levaquin [Levofloxacin] Itching and Rash    Immunization History  Administered Date(s) Administered  . Influenza Split 10/10/2010, 10/04/2011, 10/14/2013  . Influenza Whole 10/01/2007, 11/06/2008, 11/09/2009  . Influenza,inj,Quad PF,36+ Mos 09/30/2012, 10/19/2014  . Influenza-Unspecified 09/07/2015  . Pneumococcal Conjugate-13 08/27/2013  . Pneumococcal Polysaccharide-23 12/11/2001, 05/20/2015    Past Medical History:  Diagnosis Date  . Asthma   . BPH (benign prostatic hyperplasia)  severe; s/p multiple biopsies  . CAD (coronary artery disease)   . Carotid artery occlusion   . Chronic rhinitis   . COPD (chronic obstructive pulmonary disease) (HCC)    2L [place] O2  . Hyperlipidemia   . Hypertension   . Peripheral vascular disease ([place])   . 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 Syncope and collapse   . Ureteral tumor 08/2015   had endoscopic procedure for evaluation, unable to reach for biopsy    Tobacco History: History  Smoking Status  . Former Smoker  . Packs/day: 2.00  . Years: 33.00  . Types: Cigarettes  . Quit date: 01/09/1982  Smokeless Tobacco  . Never Used   Counseling given: Not Answered   Outpatient Encounter Prescriptions as of 01/17/2016  Medication Sig  . ALPRAZolam (XANAX) 0.25 MG tablet Take 0.25 mg by mouth 2 (two) times daily as needed for anxiety. Takes 1/2 tablet two times daily.  Marland Kitchen aspirin 81 MG tablet Take 81 mg by mouth daily.  Marland Kitchen  atorvastatin (LIPITOR) 20 MG tablet Take 20 mg by mouth daily at 6 PM.  . Cholecalciferol (VITAMIN D3) 2000 units TABS Take 2,000 Units by mouth daily.  . finasteride (PROSCAR) 5 MG tablet Take 5 mg by mouth every morning.  . fludrocortisone (FLORINEF) 0.1 MG tablet Take half a tablet when blood pressure is below 100 standing in the morning  . fluticasone (FLONASE) 50 MCG/ACT nasal spray Place 2 sprays into both nostrils daily.  . folic acid (FOLVITE) 1 MG tablet Take 1 tablet (1 mg total) by mouth daily. (Patient not taking: Reported on 12/29/2015)  . hydrALAZINE (APRESOLINE) 25 MG tablet Take 1 tablet (25 mg total) by mouth every 12 (twelve) hours as needed (for BP XX123456 systolic or > 123XX123 diastolic). If your BP is 99991111 systolic you may take one dose of the hydralazine that you have at home.  But would not do this more than twice a day. (Patient not taking: Reported on 12/29/2015)  . metoprolol succinate (TOPROL XL) 25 MG 24 hr tablet TAKE 1 TABLET(25 MG) BY MOUTH DAILY AT NIGHT  . Multiple Vitamin (MULTIVITAMIN) tablet Take 1 tablet by mouth daily.  Marland Kitchen NITROSTAT 0.4 MG SL tablet DISSOLVE 1 TABLET UNDER THE TONGUE AS DIRECTED AND AS NEEDED FOR CHEST PAIN  . omeprazole (PRILOSEC) 40 MG capsule TAKE 1 CAPSULE(40 MG) BY MOUTH TWICE DAILY BEFORE A MEAL (Patient taking differently: takes on prn basis)  . OXYGEN Take 2 L by mouth daily as needed. 2 l/m at bedtime and as needed for shortness of breath  . predniSONE (DELTASONE) 10 MG tablet Take 5 mg by mouth daily with breakfast.  . SPIRIVA RESPIMAT 2.5 MCG/ACT AERS INHALE 2 PUFFS BY MOUTH EVERY DAY  . SYMBICORT 160-4.5 MCG/ACT inhaler INHALE 2 PUFFS BY MOUTH EVERY MORNING AND THEN ANOTHER 2 PUFFS ABOUT 12 HOURS LATER  . thiamine 100 MG tablet Take 1 tablet (100 mg total) by mouth daily. (Patient not taking: Reported on 12/29/2015)  . XOPENEX HFA 45 MCG/ACT inhaler INHALE 2 PUFFS BY MOUTH EVERY 4 HOURS AS NEEDED FOR WHEEZING OR SHORTNESS OF BREATH   No  facility-administered encounter medications on file as of 01/17/2016.      Review of Systems  Constitutional:   No  weight loss, night sweats,  Fevers, chills,  +fatigue, or  lassitude.  HEENT:   No headaches,  Difficulty swallowing,  Tooth/dental problems, or  Sore throat,  No sneezing, itching, ear ache,  +nasal congestion, post nasal drip,   CV:  No chest pain,  Orthopnea, PND, swelling in lower extremities, anasarca, dizziness, palpitations, syncope.   GI  No heartburn, indigestion, abdominal pain, nausea, vomiting, diarrhea, change in bowel habits, loss of appetite, bloody stools.   Resp:   No chest wall deformity  Skin: no rash or lesions.  GU: no dysuria, change in color of urine, no urgency or frequency.  No flank pain, no hematuria   MS:  No joint pain or swelling.  No decreased range of motion.  No back pain.    Physical Exam  BP 110/60   Pulse 94   Temp 97.7 F (36.5 C) (Oral)   Ht 5\' 9"  (1.753 m)   Wt 140 lb 3.2 oz (63.6 kg)   SpO2 96%   BMI 20.70 kg/m   GEN: A/Ox3; pleasant , NAD, elderly , chronically ill appearing on O2    HEENT:  [place]/AT,  EACs-clear, TMs-wnl, NOSE-clear drainage THROAT-clear, no lesions, no postnasal drip or exudate noted.   NECK:  Supple w/ fair ROM; no JVD; normal carotid impulses w/o bruits; no thyromegaly or nodules palpated; no lymphadenopathy.    RESP  Decreased BS in bases  w/o, wheezes/ rales/ or rhonchi. no accessory muscle use, no dullness to percussion  CARD:  RRR, no m/r/g, tr  peripheral edema, pulses intact, no cyanosis or clubbing.  GI:   Soft & nt; nml bowel sounds; no organomegaly or masses detected.   Musco: Warm bil, no deformities or joint swelling noted.   Neuro: alert, no focal deficits noted.    Skin: Warm, no lesions or rashes  Psych:  No change in mood or affect. No depression or anxiety.  No memory loss.  Lab Results:  CBC    Component Value Date/Time   WBC 8.7 09/24/2015 2252   RBC  4.28 09/24/2015 2252   HGB 13.4 09/24/2015 2252   HCT 41.3 09/24/2015 2252   PLT 205 09/24/2015 2252   MCV 96.5 09/24/2015 2252   MCH 31.3 09/24/2015 2252   MCHC 32.4 09/24/2015 2252   RDW 12.5 09/24/2015 2252   LYMPHSABS 1.2 04/15/2015 1118   MONOABS 1.1 (H) 04/15/2015 1118   EOSABS 0.1 04/15/2015 1118   BASOSABS 0.0 04/15/2015 1118    BMET    Component Value Date/Time   NA 139 10/27/2015 1415   K 4.2 10/27/2015 1415   CL 99 10/27/2015 1415   CO2 34 (H) 10/27/2015 1415   GLUCOSE 101 (H) 10/27/2015 1415   BUN 19 10/27/2015 1415   CREATININE 0.95 10/27/2015 1415   CALCIUM 9.8 10/27/2015 1415   GFRNONAA >60 09/25/2015 0433   GFRAA >60 09/25/2015 0433    BNP No results found for: BNP  ProBNP    Component Value Date/Time   PROBNP 71.0 04/15/2015 1118    Imaging: No results found.   Assessment & Plan:   No problem-specific Assessment & Plan notes found for this encounter.     Rexene Edison, NP 01/17/2016

## 2016-01-17 NOTE — Assessment & Plan Note (Signed)
Mild flare with URI/Bronchitis  Patient's medications were reviewed today and patient education was given. Computerized medication calendar was adjusted/completed   Plan  Patient Instructions  Zpack take as directed.  Prednisone taper over next week.  Mucinex DM Twice daily  As needed  Cough/congestion  Saline nasal rinses As needed   Continue on Trilogy At bedtime  .  Follow med calendar closely and bring to each visit.  Follow up with Dr. [name]  In 3 months and As needed   Please contact office for sooner follow up if symptoms do not improve or worsen or seek emergency care

## 2016-01-19 DIAGNOSIS — F419 Anxiety disorder, unspecified: Secondary | ICD-10-CM | POA: Diagnosis not present

## 2016-01-19 DIAGNOSIS — I10 Essential (primary) hypertension: Secondary | ICD-10-CM | POA: Diagnosis not present

## 2016-01-25 DIAGNOSIS — D494 Neoplasm of unspecified behavior of bladder: Secondary | ICD-10-CM | POA: Diagnosis not present

## 2016-01-25 DIAGNOSIS — H539 Unspecified visual disturbance: Secondary | ICD-10-CM | POA: Diagnosis not present

## 2016-01-25 DIAGNOSIS — R51 Headache: Secondary | ICD-10-CM | POA: Diagnosis not present

## 2016-01-25 DIAGNOSIS — R7309 Other abnormal glucose: Secondary | ICD-10-CM | POA: Diagnosis not present

## 2016-01-25 NOTE — Addendum Note (Signed)
Addended by: Doroteo Glassman D on: 01/25/2016 05:21 PM   Modules accepted: Orders

## 2016-01-28 ENCOUNTER — Other Ambulatory Visit: Payer: Self-pay | Admitting: Family Medicine

## 2016-01-28 DIAGNOSIS — G4489 Other headache syndrome: Secondary | ICD-10-CM

## 2016-02-01 DIAGNOSIS — Z79899 Other long term (current) drug therapy: Secondary | ICD-10-CM | POA: Diagnosis not present

## 2016-02-02 ENCOUNTER — Ambulatory Visit
Admission: RE | Admit: 2016-02-02 | Discharge: 2016-02-02 | Disposition: A | Discharge status: Home / Self Care | Payer: Medicare Other | Source: Ambulatory Visit | Attending: Family Medicine | Admitting: Family Medicine

## 2016-02-02 DIAGNOSIS — R51 Headache: Secondary | ICD-10-CM | POA: Diagnosis not present

## 2016-02-02 DIAGNOSIS — G4489 Other headache syndrome: Secondary | ICD-10-CM

## 2016-02-08 ENCOUNTER — Ambulatory Visit (INDEPENDENT_AMBULATORY_CARE_PROVIDER_SITE_OTHER): Payer: Medicare Other | Admitting: Endocrinology

## 2016-02-08 ENCOUNTER — Encounter: Payer: Self-pay | Admitting: Endocrinology

## 2016-02-08 VITALS — BP 160/68 | HR 70 | Ht 67.0 in | Wt 136.0 lb

## 2016-02-08 DIAGNOSIS — I951 Orthostatic hypotension: Secondary | ICD-10-CM | POA: Diagnosis not present

## 2016-02-08 MED ORDER — PREDNISONE 5 MG PO TABS: ORAL_TABLET | ORAL | 3 refills | Status: DC

## 2016-02-08 MED ORDER — FLUDROCORTISONE ACETATE 0.1 MG PO TABS: ORAL_TABLET | ORAL | 3 refills | Status: DC

## 2016-02-08 NOTE — Progress Notes (Signed)
Patient ID: [name], male   DOB: 10-25-1929, 83 y.o.   MRN: [id number]              Chief complaint: Follow-up of low blood pressure  History of Present Illness:   The patient was felt to have adrenal insufficiency when he was admitted with orthostatic hypotension on 09/12/15. Previous symptoms were lightheadedness on standing up and not feeling good for about 2 weeks prior to this  Had no symptoms of  nausea,  decreased appetite or known electrolyte disturbance but had lost about 10-15 pounds of weight loss over the last few months for no apparent reason Patient had been on 5 mg of prednisone for about 8 months prior to this admission for his COPD   During his hospitalization he was treated with fluids and IV steroids and then discharged on prednisone 20 mg without any specific evaluation for cortisol levels or a Cortrosyn test.  Recent history:  On his initial consultation he was told to try Florinef and blood pressure was below 123XX123 systolic standing up Patient monitors the blood pressure at home with an [employer] meter but he has not brought any record   On his last visit because of periodic low blood pressure episodes and symptoms of lightheadedness especially in the morning he was told to try taking Florinef regularly twice a week with half tablet However he did not look at the instructions and has not taken Florinef except on 2 occasions when he felt lightheaded and his systolic blood pressure was around 88 one time and 98 another time.  Last episode was this past Sunday After this he did not have any further episodes  He says his blood pressure is generally lower in the morning and better later in the day However regardless of all the blood pressure is a always feels difficult to get going in the morning and feels a little weak and fuzzy headed and getting out of bed   PREDNISONE dose: He is currently on 5 mg and the dose has been gradually tapered off He still has severe COPD but  not needing any extra doses of steroids  He does complain as before of fatigue, lethargy and sleepiness Apparently has not had any cause identified by his PCP   Wt Readings from Last 3 Encounters:  02/08/16 136 lb (61.7 kg)  01/17/16 140 lb 3.2 oz (63.6 kg)  12/29/15 138 lb 3.2 oz (62.7 kg)    Lab Results  Component Value Date   CREATININE 0.95 10/27/2015   BUN 19 10/27/2015   NA 139 10/27/2015   K 4.2 10/27/2015   CL 99 10/27/2015   CO2 34 (H) 10/27/2015      Past Medical History:  Diagnosis Date  . Asthma   . BPH (benign prostatic hyperplasia)    severe; s/p multiple biopsies  . CAD (coronary artery disease)   . Carotid artery occlusion   . Chronic rhinitis   . COPD (chronic obstructive pulmonary disease) (HCC)    2L [place] O2  . Hyperlipidemia   . Hypertension   . Peripheral vascular disease ([place])   . 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 Syncope and collapse   . Ureteral tumor 08/2015   had endoscopic procedure for evaluation, unable to reach for biopsy    Past Surgical History:  Procedure Laterality Date  . CATARACT EXTRACTION  11-20-11  . CORONARY ARTERY BYPASS GRAFT  2003  . LITHOTRIPSY    . PROSTATE BIOPSY  Oct. 2014  Family History  Problem Relation Age of Onset  . Heart disease Mother   . Breast cancer Mother   . Cancer Mother     Breast cancer  . Hypertension Mother   . Other Mother     AAA  and   Amputation  . Heart disease Father     Before age 86  . Heart attack Father   . Stroke Brother     x3, still living   . Peripheral vascular disease Brother   . Heart disease Brother   . Hyperlipidemia Brother   . Hypertension Brother   . Heart disease Brother   . Allergies Brother     Social History:  reports that he quit smoking about 34 years ago. His smoking use included Cigarettes. He has a 66.00 pack-year smoking history. He has never used smokeless tobacco. He reports that he drinks alcohol. He reports that he does not use drugs.  Allergies:    Allergies  Allergen Reactions  . Sulfonamide Derivatives Swelling    REACTION: facial/tongue swelling  . Cefdinir Diarrhea    REACTION: diarrhea  . Ciprofloxacin Itching and Rash    Red itchy hands  . Nitrofurantoin Swelling    Swollen hands  . Levaquin [Levofloxacin] Itching and Rash    Allergies as of 02/08/2016      Reactions   Sulfonamide Derivatives Swelling   REACTION: facial/tongue swelling   Cefdinir Diarrhea   REACTION: diarrhea   Ciprofloxacin Itching, Rash   Red itchy hands   Nitrofurantoin Swelling   Swollen hands   Levaquin [levofloxacin] Itching, Rash      Medication List       Accurate as of 02/08/16  3:06 PM. Always use your most recent med list.          ALPRAZolam 0.25 MG tablet Commonly known as:  XANAX Take 0.25 mg by mouth 2 (two) times daily as needed for anxiety. Takes 1/2 tablet two times daily.   aspirin 81 MG tablet Take 81 mg by mouth daily.   atorvastatin 20 MG tablet Commonly known as:  LIPITOR Take 20 mg by mouth daily at 6 PM.   azithromycin 250 MG tablet Commonly known as:  ZITHROMAX Take as directed for flare up   b complex vitamins tablet Take 1 tablet by mouth daily.   finasteride 5 MG tablet Commonly known as:  PROSCAR Take 5 mg by mouth every morning.   fludrocortisone 0.1 MG tablet Commonly known as:  FLORINEF Take half a tablet when blood pressure is below 100 standing in the morning   furosemide 20 MG tablet Commonly known as:  LASIX Take 20 mg by mouth daily as needed.   guaiFENesin 600 MG 12 hr tablet Commonly known as:  MUCINEX Take by mouth 2 (two) times daily as needed.   hydrALAZINE 25 MG tablet Commonly known as:  APRESOLINE Take 1 tablet (25 mg total) by mouth every 12 (twelve) hours as needed (for BP XX123456 systolic or > 123XX123 diastolic). If your BP is 99991111 systolic you may take one dose of the hydralazine that you have at home.  But would not do this more than twice a day.   loratadine 10 MG  tablet Commonly known as:  CLARITIN Take 10 mg by mouth daily as needed for allergies.   metoprolol succinate 25 MG 24 hr tablet Commonly known as:  TOPROL XL TAKE 1 TABLET(25 MG) BY MOUTH DAILY AT NIGHT   mometasone 50 MCG/ACT nasal spray Commonly known as:  NASONEX Place 2 sprays into the nose daily.   multivitamin tablet Take 1 tablet by mouth daily.   NITROSTAT 0.4 MG SL tablet Generic drug:  nitroGLYCERIN DISSOLVE 1 TABLET UNDER THE TONGUE AS DIRECTED AND AS NEEDED FOR CHEST PAIN   omeprazole 40 MG capsule Commonly known as:  PRILOSEC TAKE 1 CAPSULE(40 MG) BY MOUTH TWICE DAILY BEFORE A MEAL   OXYGEN Take 2 L by mouth daily as needed. 2 l/m at bedtime and as needed for shortness of breath   predniSONE 10 MG tablet Commonly known as:  DELTASONE Take 5 mg by mouth daily with breakfast.   SPIRIVA RESPIMAT 2.5 MCG/ACT Aers Generic drug:  Tiotropium Bromide Monohydrate INHALE 2 PUFFS BY MOUTH EVERY DAY   SYMBICORT 160-4.5 MCG/ACT inhaler Generic drug:  budesonide-formoterol INHALE 2 PUFFS BY MOUTH EVERY MORNING AND THEN ANOTHER 2 PUFFS ABOUT 12 HOURS LATER   Vitamin D3 2000 units Tabs Take 2,000 Units by mouth daily.   XOPENEX HFA 45 MCG/ACT inhaler Generic drug:  levalbuterol INHALE 2 PUFFS BY MOUTH EVERY 4 HOURS AS NEEDED FOR WHEEZING OR SHORTNESS OF BREATH       LABS:  No visits with results within 1 Week(s) from this visit.  Latest known visit with results is:  Office Visit on 10/27/2015  Component Date Value Ref Range Status  . Sodium 10/27/2015 139  135 - 145 mEq/L Final  . Potassium 10/27/2015 4.2  3.5 - 5.1 mEq/L Final  . Chloride 10/27/2015 99  96 - 112 mEq/L Final  . CO2 10/27/2015 34* 19 - 32 mEq/L Final  . Glucose, Bld 10/27/2015 101* 70 - 99 mg/dL Final  . BUN 10/27/2015 19  6 - 23 mg/dL Final  . Creatinine, Ser 10/27/2015 0.95  0.40 - 1.50 mg/dL Final  . Calcium 10/27/2015 9.8  8.4 - 10.5 mg/dL Final  . GFR 10/27/2015 80.23  >60.00 mL/min  Final  . Free T4 10/27/2015 0.80  0.60 - 1.60 ng/dL Final   Comment: Specimens from patients who are undergoing biotin therapy and /or ingesting biotin supplements may contain high levels of biotin.  The higher biotin concentration in these specimens interferes with this Free T4 assay.  Specimens that contain high levels  of biotin may cause false high results for this Free T4 assay.  Please interpret results in light of the total clinical presentation of the patient.       Review of Systems   PHYSICAL EXAM:  BP (!) 160/68   Pulse 70   Ht 5\' 7"  (1.702 m)   Wt 136 lb (61.7 kg)   SpO2 94%   BMI 21.30 kg/m   Repeat standing blood pressure left arm 144/66  No edema  ASSESSMENT:   History of orthostatic hypotension, likely to be some autonomic dysfunction He has had only occasional episodes more recently He has not taken Florinef that was recommended except on 2 occasions and does appear to be responding to even 0.05 mg doses  Clinically no evidence of adrenal insufficiency even with  tapering down his prednisone down to 5 mg  Fatigue and lethargy, relatively chronic and unclear of the etiology. He is asking about trial of Zoloft and will deferred this to his PCP      PLAN:    Trial of Florinef low-dose with 0.05 mg every Sunday.  He will try to take prednisone half tablet twice a day since he appears to be feeling more lethargic in the morning with lower blood pressure readings and better  after taking prednisone in the morning  Medication list was updated  Continue follow-up with PCP for various other issues   There are no Patient Instructions on file for this visit.   [name] 02/08/2016, 3:06 PM

## 2016-02-08 NOTE — Patient Instructions (Signed)
Prednisone 2.5mg  twice daily

## 2016-02-09 ENCOUNTER — Ambulatory Visit (INDEPENDENT_AMBULATORY_CARE_PROVIDER_SITE_OTHER): Payer: Medicare Other | Admitting: Adult Health

## 2016-02-09 ENCOUNTER — Ambulatory Visit (INDEPENDENT_AMBULATORY_CARE_PROVIDER_SITE_OTHER)
Admission: RE | Admit: 2016-02-09 | Discharge: 2016-02-09 | Disposition: A | Discharge status: Home / Self Care | Payer: Medicare Other | Source: Ambulatory Visit | Attending: Adult Health | Admitting: Adult Health

## 2016-02-09 ENCOUNTER — Encounter: Payer: Self-pay | Admitting: Adult Health

## 2016-02-09 VITALS — BP 136/60 | HR 89 | Temp 98.2°F | Ht 69.0 in | Wt 137.4 lb

## 2016-02-09 DIAGNOSIS — J441 Chronic obstructive pulmonary disease with (acute) exacerbation: Secondary | ICD-10-CM

## 2016-02-09 DIAGNOSIS — J449 Chronic obstructive pulmonary disease, unspecified: Secondary | ICD-10-CM

## 2016-02-09 DIAGNOSIS — J9612 Chronic respiratory failure with hypercapnia: Secondary | ICD-10-CM

## 2016-02-09 DIAGNOSIS — J9611 Chronic respiratory failure with hypoxia: Secondary | ICD-10-CM | POA: Diagnosis not present

## 2016-02-09 MED ORDER — PREDNISONE 10 MG PO TABS: 5.0000 mg | ORAL_TABLET | Freq: Two times a day (BID) | ORAL | 5 refills | Status: DC

## 2016-02-09 MED ORDER — LEVALBUTEROL HCL 0.63 MG/3ML IN NEBU: 0.6300 mg | INHALATION_SOLUTION | RESPIRATORY_TRACT | 12 refills | Status: DC | PRN

## 2016-02-09 MED ORDER — AZITHROMYCIN 250 MG PO TABS: ORAL_TABLET | ORAL | 0 refills | Status: AC

## 2016-02-09 NOTE — Patient Instructions (Signed)
Zpack take as directed.  Increase Prednisone 5mg  Twice daily  .  Mucinex DM Twice daily  As needed  Cough/congestion  Saline nasal rinses As needed   Continue on Trilogy At bedtime  .  May use Xopenex every 4hr as needed.  Follow up with Dr. [name]  In 2 months and As needed   Please contact office for sooner follow up if symptoms do not improve or worsen or seek emergency care

## 2016-02-09 NOTE — Progress Notes (Signed)
@Patient  ID: [name], male    DOB: February 27, 1929, 83 y.o.   MRN: [id number]  Chief Complaint  Patient presents with  . Acute Visit    COPD     Referring provider: [name], MD  HPI: 83 yo male former smoker (1983) followed for GOLD III COPD w/ asthma component , O2 dependent RF with act and At bedtime     TEST /Events  PFTs 05/04/05 FEV1 36% ratio 28% with 29% improvement after bronchodilators DLCO 48%  - PFT's 03/26/08 30% ratio 34 with 14% improvement after bronchodilators DLC0 38 %  - Referred to rehab 12/19/2012 >completed March 2015  - changed to spiriva respimat 02/14/2013  - started daily prednisone 06/19/13 >improved only a little>tapered off mid July 2015 >ok to change to prn prednisone 08/27/2013  - flared off nasonex and gerd rx 09/21/2014 > resumed - 10/19/2014 p extensive coaching HFA effectiveness = 90%  - changed pred to ceiling of 20 / floor of 5 mg daily as of 10/19/2014 >changed to floor of 10 mg daily 10/04/2015  - PFT's 06/08/2015 FEV1 0.58 (22 % ) ratio 27 p No sign % improvement from saba p symb/spiriva prior to study with DLCO 9/9 % corrects to 27 % for alv volume  Start noct 02 at 2lpm 2008 and on 03/04/09 desat @ >185 ft so rec wear with activtiy >rm to rm  - 2/6/2013Walked RA x one lap @ 185 stopped due to desat >corrected on 2lpm - 02/26/2014 Walked 2lpm 2 laps @ 185 ft each stopped due to Sob/ sats 88% at nl pace  - 10/10/2016Walked RA 2 laps @ 185 ft each stopped due to End of study, slow pace, sob But no desat  - HCO3 10/27/15 = 34  02/10/2016 Acute OV  : COPD/O2 dependent RF  Pt presents for an acute office visit. Complains that cough , congestion and dyspnea are worse for last 2 weeks. He was treated for flare 3-4 weeks ago, says he got better . But went back down hill 2 weeks ago. Says his mucus is very thick and hard to get up at times. Has a lot of nasal stuffiness and drainage. He denies fever or body aches. No  n/v/d.   He remains on Trilogy at bedtime for hypercarbic RF.  Has not seen much improvement in breathing since starting.     Allergies  Allergen Reactions  . Sulfonamide Derivatives Swelling    REACTION: facial/tongue swelling  . Cefdinir Diarrhea    REACTION: diarrhea  . Ciprofloxacin Itching and Rash    Red itchy hands  . Nitrofurantoin Swelling    Swollen hands  . Levaquin [Levofloxacin] Itching and Rash    Immunization History  Administered Date(s) Administered  . Influenza Split 10/10/2010, 10/04/2011, 10/14/2013  . Influenza Whole 10/01/2007, 11/06/2008, 11/09/2009  . Influenza,inj,Quad PF,36+ Mos 09/30/2012, 10/19/2014  . Influenza-Unspecified 09/07/2015  . Pneumococcal Conjugate-13 08/27/2013  . Pneumococcal Polysaccharide-23 12/11/2001, 05/20/2015    Past Medical History:  Diagnosis Date  . Asthma   . BPH (benign prostatic hyperplasia)    severe; s/p multiple biopsies  . CAD (coronary artery disease)   . Carotid artery occlusion   . Chronic rhinitis   . COPD (chronic obstructive pulmonary disease) (HCC)    2L [place] O2  . Hyperlipidemia   . Hypertension   . Peripheral vascular disease ([place])   . 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 Syncope and collapse   . Ureteral tumor 08/2015  had endoscopic procedure for evaluation, unable to reach for biopsy    Tobacco History: History  Smoking Status  . Former Smoker  . Packs/day: 2.00  . Years: 33.00  . Types: Cigarettes  . Quit date: 01/09/1982  Smokeless Tobacco  . Never Used   Counseling given: Not Answered   Outpatient Encounter Prescriptions as of 02/09/2016  Medication Sig  . ALPRAZolam (XANAX) 0.25 MG tablet Take 0.25 mg by mouth 2 (two) times daily as needed for anxiety. Takes 1/2 tablet two times daily.  Marland Kitchen aspirin 81 MG tablet Take 81 mg by mouth daily.  Marland Kitchen atorvastatin (LIPITOR) 20 MG tablet Take 20 mg by mouth daily at 6 PM.  . b complex vitamins tablet Take 1 tablet by mouth daily.  . Cholecalciferol  (VITAMIN D3) 2000 units TABS Take 2,000 Units by mouth daily.  . finasteride (PROSCAR) 5 MG tablet Take 5 mg by mouth every morning.  . fludrocortisone (FLORINEF) 0.1 MG tablet Take half a tablet on Sundays  . furosemide (LASIX) 20 MG tablet Take 20 mg by mouth daily as needed.  . hydrALAZINE (APRESOLINE) 25 MG tablet Take 1 tablet (25 mg total) by mouth every 12 (twelve) hours as needed (for BP [id number] systolic or > [id number] diastolic). If your BP is 99991111 systolic you may take one dose of the hydralazine that you have at home.  But would not do this more than twice a day.  . loratadine (CLARITIN) 10 MG tablet Take 10 mg by mouth daily as needed for allergies.  . metoprolol succinate (TOPROL XL) 25 MG 24 hr tablet TAKE 1 TABLET(25 MG) BY MOUTH DAILY AT NIGHT  . mometasone (NASONEX) 50 MCG/ACT nasal spray Place 2 sprays into the nose daily.  . Multiple Vitamin (MULTIVITAMIN) tablet Take 1 tablet by mouth daily.  Marland Kitchen NITROSTAT 0.4 MG SL tablet DISSOLVE 1 TABLET UNDER THE TONGUE AS DIRECTED AND AS NEEDED FOR CHEST PAIN  . omeprazole (PRILOSEC) 40 MG capsule TAKE 1 CAPSULE(40 MG) BY MOUTH TWICE DAILY BEFORE A MEAL (Patient taking differently: takes on prn basis)  . OXYGEN Take 2 L by mouth daily as needed. 2 l/m at bedtime and as needed for shortness of breath  . predniSONE (DELTASONE) 5 MG tablet 1/2 tab at Bfst and dinner  . SPIRIVA RESPIMAT 2.5 MCG/ACT AERS INHALE 2 PUFFS BY MOUTH EVERY DAY  . SYMBICORT 160-4.5 MCG/ACT inhaler INHALE 2 PUFFS BY MOUTH EVERY MORNING AND THEN ANOTHER 2 PUFFS ABOUT 12 HOURS LATER  . XOPENEX HFA 45 MCG/ACT inhaler INHALE 2 PUFFS BY MOUTH EVERY 4 HOURS AS NEEDED FOR WHEEZING OR SHORTNESS OF BREATH  . azithromycin (ZITHROMAX Z-PAK) 250 MG tablet Take 2 tablets (500 mg) on  Day 1,  followed by 1 tablet (250 mg) once daily on Days 2 through 5.  . azithromycin (ZITHROMAX) 250 MG tablet Take as directed for flare up  . guaiFENesin (MUCINEX) 600 MG 12 hr tablet Take by mouth 2 (two) times  daily as needed.  . levalbuterol (XOPENEX) 0.63 MG/3ML nebulizer solution Take 3 mLs (0.63 mg total) by nebulization every 4 (four) hours as needed for wheezing or shortness of breath.  . predniSONE (DELTASONE) 10 MG tablet Take 0.5 tablets (5 mg total) by mouth 2 (two) times daily with a meal.   No facility-administered encounter medications on file as of 02/09/2016.      Review of Systems  Constitutional:   No  weight loss, night sweats,  Fevers, chills, +fatigue, or  lassitude.  HEENT:   No headaches,  Difficulty swallowing,  Tooth/dental problems, or  Sore throat,                No sneezing, itching, ear ache, + nasal congestion, post nasal drip,   CV:  No chest pain,  Orthopnea, PND, swelling in lower extremities, anasarca, dizziness, palpitations, syncope.   GI  No heartburn, indigestion, abdominal pain, nausea, vomiting, diarrhea, change in bowel habits, loss of appetite, bloody stools.   Resp:    No wheezing.  No chest wall deformity  Skin: no rash or lesions.  GU: no dysuria, change in color of urine, no urgency or frequency.  No flank pain, no hematuria   MS:  No joint pain or swelling.  No decreased range of motion.  No back pain.    Physical Exam  BP 136/60 (BP Location: Left Arm, Cuff Size: Normal)   Pulse 89   Temp 98.2 F (36.8 C) (Oral)   Ht 5\' 9"  (1.753 m)   Wt 137 lb 6.4 oz (62.3 kg)   SpO2 98%   BMI 20.29 kg/m   GEN: A/Ox3; pleasant , NAD, elderly and chronically ill appearing on O2    HEENT:  Venturia/AT,  EACs-clear, TMs-wnl, NOSE-clear drainage , THROAT-clear, no lesions, no postnasal drip or exudate noted.   NECK:  Supple w/ fair ROM; no JVD; normal carotid impulses w/o bruits; no thyromegaly or nodules palpated; no lymphadenopathy.    RESP  Decreased BS in bases ,  no accessory muscle use, no dullness to percussion  CARD:  RRR, no m/r/g, tr peripheral edema, pulses intact, no cyanosis or clubbing.  GI:   Soft & nt; nml bowel sounds; no  organomegaly or masses detected.   Musco: Warm bil, no deformities or joint swelling noted.   Neuro: alert, no focal deficits noted.    Skin: Warm, no lesions or rashes  Psych:  No change in mood or affect. No depression or anxiety.  No memory loss.  Lab Results:  CBC    Component Value Date/Time   WBC 8.7 09/24/2015 2252   RBC 4.28 09/24/2015 2252   HGB 13.4 09/24/2015 2252   HCT 41.3 09/24/2015 2252   PLT 205 09/24/2015 2252   MCV 96.5 09/24/2015 2252   MCH 31.3 09/24/2015 2252   MCHC 32.4 09/24/2015 2252   RDW 12.5 09/24/2015 2252   LYMPHSABS 1.2 04/15/2015 1118   MONOABS 1.1 (H) 04/15/2015 1118   EOSABS 0.1 04/15/2015 1118   BASOSABS 0.0 04/15/2015 1118    BMET    Component Value Date/Time   NA 139 10/27/2015 1415   K 4.2 10/27/2015 1415   CL 99 10/27/2015 1415   CO2 34 (H) 10/27/2015 1415   GLUCOSE 101 (H) 10/27/2015 1415   BUN 19 10/27/2015 1415   CREATININE 0.95 10/27/2015 1415   CALCIUM 9.8 10/27/2015 1415   GFRNONAA >60 09/25/2015 0433   GFRAA >60 09/25/2015 0433    BNP No results found for: BNP  ProBNP    Component Value Date/Time   PROBNP 71.0 04/15/2015 1118    Imaging: Dg Chest 2 View  Result Date: 02/09/2016 CLINICAL DATA:  83 y/o M; history of COPD with worsening shortness of breath. EXAM: CHEST  2 VIEW COMPARISON:  09/24/2015 chest radiograph. FINDINGS: Stable cardiac silhouette within normal limits. Aortic atherosclerosis with calcification. Post median sternotomy with stable wires. No acute osseous abnormality. Hyperinflated lungs with flattened diaphragms compatible with COPD. Chronic blunted costal diaphragmatic angles. No focal consolidation. IMPRESSION:  No active cardiopulmonary disease.  COPD.  Aortic atherosclerosis. Electronically Signed   By: Kristine Garbe M.D.   On: 02/09/2016 15:15   Ct Head Wo Contrast  Result Date: 02/02/2016 CLINICAL DATA:  Headaches, dizziness, blurred vision. EXAM: CT HEAD WITHOUT CONTRAST  TECHNIQUE: Contiguous axial images were obtained from the base of the skull through the vertex without intravenous contrast. COMPARISON:  None. FINDINGS: Brain: Mild diffuse cortical atrophy is noted. No mass effect or midline shift is noted. Ventricular size is within normal limits. There is no evidence of mass lesion, hemorrhage or acute infarction. Vascular: Atherosclerosis of carotid siphons is noted. Skull: Normal. Negative for fracture or focal lesion. Sinuses/Orbits: No acute finding. Other: None. IMPRESSION: Mild diffuse cortical atrophy. No acute intracranial abnormality seen. Electronically Signed   By: Marijo Conception, M.D.   On: 02/02/2016 16:11     Assessment & Plan:   COPD GOLD IV/ 02 dep /steroid dep Recurrent flare   Plan  Patient Instructions  Zpack take as directed.  Increase Prednisone 5mg  Twice daily  .  Mucinex DM Twice daily  As needed  Cough/congestion  Saline nasal rinses As needed   Continue on Trilogy At bedtime  .  May use Xopenex every 4hr as needed.  Follow up with Dr. [name]  In 2 months and As needed   Please contact office for sooner follow up if symptoms do not improve or worsen or seek emergency care      Chronic respiratory failure with hypoxia and hypercapnia ([place]) Cont on O2  Continue on Trilogy At bedtime       Rexene Edison, NP 02/10/2016

## 2016-02-10 NOTE — Assessment & Plan Note (Signed)
Cont on O2  Continue on Trilogy At bedtime

## 2016-02-10 NOTE — Progress Notes (Signed)
Chart and office note reviewed in detail  > agree with a/p as outlined    

## 2016-02-10 NOTE — Assessment & Plan Note (Signed)
Recurrent flare   Plan  Patient Instructions  Zpack take as directed.  Increase Prednisone 5mg  Twice daily  .  Mucinex DM Twice daily  As needed  Cough/congestion  Saline nasal rinses As needed   Continue on Trilogy At bedtime  .  May use Xopenex every 4hr as needed.  Follow up with Dr. [name]  In 2 months and As needed   Please contact office for sooner follow up if symptoms do not improve or worsen or seek emergency care

## 2016-02-14 ENCOUNTER — Encounter: Payer: Self-pay | Admitting: Pulmonary Disease

## 2016-02-14 ENCOUNTER — Ambulatory Visit (INDEPENDENT_AMBULATORY_CARE_PROVIDER_SITE_OTHER): Payer: Medicare Other | Admitting: Pulmonary Disease

## 2016-02-14 ENCOUNTER — Telehealth: Payer: Self-pay | Admitting: Internal Medicine

## 2016-02-14 DIAGNOSIS — J9612 Chronic respiratory failure with hypercapnia: Secondary | ICD-10-CM

## 2016-02-14 DIAGNOSIS — J9611 Chronic respiratory failure with hypoxia: Secondary | ICD-10-CM | POA: Diagnosis not present

## 2016-02-14 DIAGNOSIS — J449 Chronic obstructive pulmonary disease, unspecified: Secondary | ICD-10-CM

## 2016-02-14 DIAGNOSIS — J328 Other chronic sinusitis: Secondary | ICD-10-CM | POA: Diagnosis not present

## 2016-02-14 MED ORDER — IPRATROPIUM BROMIDE 0.03 % NA SOLN: 2.0000 | Freq: Four times a day (QID) | NASAL | 1 refills | Status: DC

## 2016-02-14 NOTE — Patient Instructions (Signed)
  It was a pleasure taking care of you today!  You are diagnosed with chronic hypoxemic respiratory failure. Continue using your oxygen as instructed. 2 L, 24/7. Continue using your Trilogy machine with 2 L o2.   We will get an oxygen test to determine if you will need more O2 with your Trilogy.  We will get a blood test.   Try atrovent nasal spray, 2 squirts/nostril 4x/day.    Return to clinic in 3 mos with Dr. Corrie Dandy

## 2016-02-14 NOTE — Assessment & Plan Note (Signed)
Patient has severe/very severe emphysema. Last PFT with an FEV1 of 36%. Diffusion capacity of 48%. (05/04/2005) Patient currently is now on oxygen 2 L 24 7.  Cont symbicort 160/4.5 2P BID, spiriva daily, duoneb prn.  Cont O2 2L 24/7. Cont NIV (Trilogy)

## 2016-02-14 NOTE — Assessment & Plan Note (Addendum)
Patient on oxygen 2 L 24 7. At the start of 2017, he was just on 2 L when necessary. ABG done in 2012 showed CO2 of 48, unknown FiO2. ABG on 2L  (05/2015)  7.4/49/89 on 2L.   Plan to keep O2 sats > 88%.  Needs ONO on 2L and NIV. Needs ABG on 2L. Need to determine if we need to tweak his NIV settings.

## 2016-02-14 NOTE — Assessment & Plan Note (Addendum)
Patient has severe/very severe emphysema. Last PFT with an FEV1 of 36%. Diffusion capacity of 48%. (05/04/2005) Patient currently is on oxygen 2 L 24 7. In 2017, patient's COPD has been very difficult to control with frequent exacerbation requiring frequent office visits, prednisone, antibiotics. He is also now on oxygen 2 L 24/7. Prior to this, he was just on oxygen 2 L with exertion.  We started NIV Astral on this patient back in June 2017. Since starting it, he has had issues tolerating it because the pressures too much. He usually wakes up early morning with too much pressure, unable to breathe, suffocating, uncomfortable.   We switched his noninvasive ventilator from astral to Trilogy and he is tolerating that better. Pressures are better tolerated. His mask leak issues are better.  He is sleeping through the night with Trilogy.   He remains "fatigued" when he wakes up in the morning.   Plan : 1. Cont NIV for now with 2L o2. He feels better over all with its use.  Not as many COPD exacerbations since using it. Feels benefit of NIV.  2. Needs ONO on 2L and NIV.  Need to determine if he needs more o2 with his NIV.  3. Currently he is on Symbicort, 160/4.5, 2 puffs twice a day and Spiriva respimat. He uses prn Duoneb.  4. UTD with vaccines (PNA and Flu).

## 2016-02-14 NOTE — Telephone Encounter (Signed)
Error.Stanley A Dalton ° °

## 2016-02-14 NOTE — Progress Notes (Signed)
Subjective:    Patient ID: [name], male    DOB: 10-10-1929, 83 y.o.   MRN: [id number]  HPI  Pt is being seen regularly by MW for very severe/severe COPD, chronic hypoxemic resp failure.   Pt has had 2 sleep studies. Last study was 03/2005 and AHI was <1.   Pt's COPD has been difficult to control. Pt with more frequent flare ups requiring higher prednisone +/- Abx. Pt has also been seeing/calling Dr. [name].   Also on 2L 24/7 the last 6 mos.   Gets 8-9hrs/night. No  No witnessed apneas. , gasping, choking.  Wakes up very tired.  Naps every now and then.  Occasional jerking of legs at sleep.   Follows with Cardiology. Sees Dr. Tamala Julian for CAD. CAD has been stable.   NIV (trilogy) started and is helping some.    ROV 09/09/15 Patient returns to the office as follow-up on his severe COPD, chronic hypoxemic respiratory failure, NIV use. He usually follows with Dr. [name]. Last time he saw her was back in June. Since I last saw him, he has been busy with another diagnosis. He was found to have urinary bladder tumor. At that time, he was visiting his son in [place]. He ended up getting a biopsy. It was a limited biopsy because of his comorbidities. The thought of doing surgery for the procedure but it was too risky and that has been held off. He subsequently also followed up with [facility] for the urinary bladder tumor. With regards to his breathing, its overall the same. He states he is able to use his inhalers. Regarding the ventilator, he has had issues since starting to use it. He feels the pressure is too much. He ends up waking up early morning, very comfortable, suffocating, unable to tolerate the pressure. He has called his DME [facility] and they're supposed to follow up on him tomorrow. He uses oxygen with exertion, O2 saturation runs mid 90s. Has not been on antibiotics since last seen. Has nasal congestion and postnasal drip.  ROV  12/09/15 Patient returns to the office as follow-up on  his chronic hypoxemic respiratory failure and COPD. He usually sees Dr. [name] for his end-stage COPD. Last seen by Dr. [name] on September 25. Prior to that, I saw him and suggested trying nebulizer medicines. Nebulizer machine did not work for him. We switched him from an astral to a trilogy and he started using trilogy sometime in October. He feels the pressure is adequate but the mask is not comfortable. He has a full face mask. The mask gets dislodged frequently in the middle of the night causing him to wake up. Because of that, he is not able to sleep straight through the night. There are nights that he does not use that trilogy and he sleeps straight. He probably is more winded and sleepy during the days . He did not use the trilogy. Has not been admitted nor been on antibiotics since last seen. He is having issues with his blood pressure. Cardiology and endocrinology are managing that. He states his blood pressure is low at night and elevated during the daytime.  ROV 02/14/2016  Pt returns to the office as f/u on his chronic resp fx.  He was recently seen by TP for cough, congestion, wheezing, SOB.  He got Abx and prednisone. Sx are better except for almost continuous nasal drainage.  His NIV was switched from Astral to Trilogy and he loves his Trilogy!  Very quiet,  mask with minimal leak. Tolerating it well.  No issues with it.  He sleeps through the night.  His "fatigue" remains though when he wakes up.  It takes him several hours before he has the energy to start the day.  Remains on the same o2 requirement.    Review of Systems  Constitutional: Negative for fever and unexpected weight change.  HENT: Positive for congestion, postnasal drip and sinus pressure. Negative for dental problem, ear pain, nosebleeds, rhinorrhea, sneezing, sore throat and trouble swallowing.   Eyes: Negative.  Negative for redness and itching.  Respiratory: Positive for shortness of breath. Negative for cough, chest tightness  and wheezing.   Cardiovascular: Negative for palpitations and leg swelling.  Gastrointestinal: Negative for nausea and vomiting.  Endocrine: Negative.   Genitourinary: Negative.  Negative for dysuria.  Musculoskeletal: Positive for arthralgias and myalgias. Negative for joint swelling.  Skin: Negative.  Negative for rash.  Allergic/Immunologic: Negative.   Neurological: Positive for light-headedness. Negative for headaches.  Hematological: Bruises/bleeds easily.  Psychiatric/Behavioral: Negative.  Negative for dysphoric mood. The patient is not nervous/anxious.         Objective:   Physical Exam   Vitals:  Vitals:   02/14/16 1348  BP: (!) 156/84  Pulse: 96  SpO2: 93%  Weight: 138 lb (62.6 kg)  Height: 5\' 9"  (1.753 m)   Constitutional/General:  Pleasant, well-nourished, well-developed, not in any distress,  Comfortably seating.  Well kempt chronically ill. With his 2 L o2.   Body mass index is 20.38 kg/m. Wt Readings from Last 3 Encounters:  02/14/16 138 lb (62.6 kg)  02/09/16 137 lb 6.4 oz (62.3 kg)  02/08/16 136 lb (61.7 kg)    Neck circumference: 15 in  HEENT: Pupils equal and reactive to light and accommodation. Anicteric sclerae. Normal nasal mucosa.   No oral  lesions,  mouth clear,  oropharynx clear, no postnasal drip. (-) Oral thrush. No dental caries.  Airway - Mallampati class III  Neck: No masses. Midline trachea. No JVD, (-) LAD. (-) bruits appreciated.  Respiratory/Chest: Grossly normal chest. (-) deformity. (-) Accessory muscle use.  Symmetric expansion. (-) Tenderness on palpation.  Resonant on percussion.  Diminished BS on both lung fields. Clear to auscultation. (-) wheezing,  Rhonchi. Some crackles at bases.  (-) egophony  Cardiovascular: Regular rate and  rhythm, heart sounds normal, no murmur or gallops, trace  peripheral edema  Gastrointestinal:  Normal bowel sounds. Soft, non-tender. No hepatosplenomegaly.  (-) masses.    Musculoskeletal:  Normal muscle tone. Normal gait.   Extremities: Grossly normal. (-) clubbing, cyanosis.  trace edema  Skin: (-) rash,lesions seen.   Neurological/Psychiatric : alert, oriented to time, place, person. Normal mood and affect           Assessment & Plan:  COPD GOLD IV/ 02 dep /steroid dep Patient has severe/very severe emphysema. Last PFT with an FEV1 of 36%. Diffusion capacity of 48%. (05/04/2005) Patient currently is now on oxygen 2 L 24 7.  Cont symbicort 160/4.5 2P BID, spiriva daily, duoneb prn.  Cont O2 2L 24/7. Cont NIV (Trilogy)  Chronic respiratory failure with hypoxia and hypercapnia (HCC) Patient has severe/very severe emphysema. Last PFT with an FEV1 of 36%. Diffusion capacity of 48%. (05/04/2005) Patient currently is on oxygen 2 L 24 7. In 2017, patient's COPD has been very difficult to control with frequent exacerbation requiring frequent office visits, prednisone, antibiotics. He is also now on oxygen 2 L 24/7. Prior to this, he was just  on oxygen 2 L with exertion.  We started NIV Astral on this patient back in June 2017. Since starting it, he has had issues tolerating it because the pressures too much. He usually wakes up early morning with too much pressure, unable to breathe, suffocating, uncomfortable.   We switched his noninvasive ventilator from astral to Trilogy and he is tolerating that better. Pressures are better tolerated. His mask leak issues are better.  He is sleeping through the night with Trilogy.   He remains "fatigued" when he wakes up in the morning.   Plan : 1. Cont NIV for now with 2L o2. He feels better over all with its use.  Not as many COPD exacerbations since using it. Feels benefit of NIV.  2. Needs ONO on 2L and NIV.  Need to determine if he needs more o2 with his NIV.  3. Currently he is on Symbicort, 160/4.5, 2 puffs twice a day and Spiriva respimat. He uses prn Duoneb.  4. UTD with vaccines (PNA and Flu).      Chronic hypoxemic respiratory failure (HCC) Patient on oxygen 2 L 24 7. At the start of 2017, he was just on 2 L when necessary. ABG done in 2012 showed CO2 of 48, unknown FiO2. ABG on 2L  (05/2015)  7.4/49/89 on 2L.   Plan to keep O2 sats > 88%.  Needs ONO on 2L and NIV. Needs ABG on 2L. Need to determine if we need to tweak his NIV settings.   Sinusitis, chronic Increased nasal drip and congestion.  On flonase.  Trial with atrovent nasal spray.   Return to clinic in May  I spent at least 25 minutes with the patient today and more than 50% was spent counseling the patient regarding disease and management and facilitating labs and medications.         Monica Becton, MD 02/15/2016, 6:16 AM [facility] Pager [phone] After 3 pm or if no answer, call [phone]

## 2016-02-15 NOTE — Assessment & Plan Note (Signed)
Increased nasal drip and congestion.  On flonase.  Trial with atrovent nasal spray.

## 2016-02-18 ENCOUNTER — Ambulatory Visit (HOSPITAL_COMMUNITY)
Admission: RE | Admit: 2016-02-18 | Discharge: 2016-02-18 | Disposition: A | Discharge status: Home / Self Care | Payer: Medicare Other | Source: Ambulatory Visit | Attending: Pulmonary Disease | Admitting: Pulmonary Disease

## 2016-02-18 DIAGNOSIS — J449 Chronic obstructive pulmonary disease, unspecified: Secondary | ICD-10-CM | POA: Diagnosis not present

## 2016-02-18 LAB — BLOOD GAS, ARTERIAL
Acid-Base Excess: 2.2 mmol/L — ABNORMAL HIGH (ref 0.0–2.0)
Bicarbonate: 27.4 mmol/L (ref 20.0–28.0)
O2 Content: 2 L/min
O2 Saturation: 97.9 %
Patient temperature: 98.6
pCO2 arterial: 47.4 mmHg (ref 32.0–48.0)
pH, Arterial: 7.381 (ref 7.350–7.450)
pO2, Arterial: 110 mmHg — ABNORMAL HIGH (ref 83.0–108.0)

## 2016-02-23 DIAGNOSIS — H26493 Other secondary cataract, bilateral: Secondary | ICD-10-CM | POA: Diagnosis not present

## 2016-02-23 DIAGNOSIS — H1859 Other hereditary corneal dystrophies: Secondary | ICD-10-CM | POA: Diagnosis not present

## 2016-02-23 DIAGNOSIS — H353131 Nonexudative age-related macular degeneration, bilateral, early dry stage: Secondary | ICD-10-CM | POA: Diagnosis not present

## 2016-02-23 DIAGNOSIS — Z961 Presence of intraocular lens: Secondary | ICD-10-CM | POA: Diagnosis not present

## 2016-02-23 DIAGNOSIS — H35373 Puckering of macula, bilateral: Secondary | ICD-10-CM | POA: Diagnosis not present

## 2016-02-24 ENCOUNTER — Telehealth: Payer: Self-pay | Admitting: Pulmonary Disease

## 2016-02-24 NOTE — Telephone Encounter (Signed)
   ABG with CO2 better than baseline but still elevated.  From 91 prior to trilogy, his CO2 is now 23 on astral  I called [facility] and asked them to tweak settings to increase minute ventilation a little bit.   They will get back at me to discuss settings.    Monica Becton, MD 02/24/2016, 5:19 PM  [facility] Pager [phone] After 3 pm or if no answer, call [phone]

## 2016-02-25 NOTE — Progress Notes (Signed)
Spoke with patient and informed him of results and DME adjusting his setting if possible. Pt did not have any additional questions. Nothing further is needed.

## 2016-03-06 ENCOUNTER — Telehealth: Payer: Self-pay | Admitting: Internal Medicine

## 2016-03-06 NOTE — Telephone Encounter (Signed)
Spoke with pt. Advised him that he can use his Xopenex inhaler when not using his nebulizer. Nothing further was needed.

## 2016-03-09 DIAGNOSIS — I119 Hypertensive heart disease without heart failure: Secondary | ICD-10-CM | POA: Diagnosis not present

## 2016-03-09 DIAGNOSIS — I2510 Atherosclerotic heart disease of native coronary artery without angina pectoris: Secondary | ICD-10-CM | POA: Diagnosis not present

## 2016-03-09 DIAGNOSIS — I2781 Cor pulmonale (chronic): Secondary | ICD-10-CM | POA: Diagnosis not present

## 2016-03-09 DIAGNOSIS — R002 Palpitations: Secondary | ICD-10-CM | POA: Diagnosis not present

## 2016-03-09 DIAGNOSIS — J449 Chronic obstructive pulmonary disease, unspecified: Secondary | ICD-10-CM | POA: Diagnosis not present

## 2016-03-09 DIAGNOSIS — R0609 Other forms of dyspnea: Secondary | ICD-10-CM | POA: Diagnosis not present

## 2016-03-09 DIAGNOSIS — E785 Hyperlipidemia, unspecified: Secondary | ICD-10-CM | POA: Diagnosis not present

## 2016-03-09 DIAGNOSIS — I4949 Other premature depolarization: Secondary | ICD-10-CM | POA: Diagnosis not present

## 2016-03-09 DIAGNOSIS — I872 Venous insufficiency (chronic) (peripheral): Secondary | ICD-10-CM | POA: Diagnosis not present

## 2016-03-09 DIAGNOSIS — I6523 Occlusion and stenosis of bilateral carotid arteries: Secondary | ICD-10-CM | POA: Diagnosis not present

## 2016-03-09 DIAGNOSIS — I951 Orthostatic hypotension: Secondary | ICD-10-CM | POA: Diagnosis not present

## 2016-03-09 DIAGNOSIS — I739 Peripheral vascular disease, unspecified: Secondary | ICD-10-CM | POA: Diagnosis not present

## 2016-03-10 ENCOUNTER — Encounter: Payer: Self-pay | Admitting: Pulmonary Disease

## 2016-03-10 DIAGNOSIS — J9620 Acute and chronic respiratory failure, unspecified whether with hypoxia or hypercapnia: Secondary | ICD-10-CM | POA: Diagnosis not present

## 2016-03-10 DIAGNOSIS — J449 Chronic obstructive pulmonary disease, unspecified: Secondary | ICD-10-CM | POA: Diagnosis not present

## 2016-03-11 ENCOUNTER — Other Ambulatory Visit: Payer: Self-pay | Admitting: Internal Medicine

## 2016-03-13 DIAGNOSIS — R0609 Other forms of dyspnea: Secondary | ICD-10-CM | POA: Diagnosis not present

## 2016-03-16 ENCOUNTER — Telehealth: Payer: Self-pay | Admitting: Pulmonary Disease

## 2016-03-16 NOTE — Telephone Encounter (Signed)
   DME [facility]  visited patient to adjust his NIV machine.  RT increased his TV to 450 from 425 mls.  Tolerating OK.  His ONO on NIV and 2 L was acceptable.   Plan : 1. Cont NIV 2. Cont 2L o2 with NIV.   Jasmine : pls tell pt 2L O2 with NIV is OK.  Thanks.   Monica Becton, MD 03/16/2016, 1:06 PM [facility] Pager [phone] After 3 pm or if no answer, call [phone]

## 2016-03-20 NOTE — Telephone Encounter (Signed)
Spoke with pt and informed him of ADs message pt understood and had no further questions.Nothing further is needed at this time.

## 2016-03-21 ENCOUNTER — Encounter: Payer: Self-pay | Admitting: Interventional Cardiology

## 2016-03-26 NOTE — Progress Notes (Signed)
Cardiology Office Note    Date:  03/27/2016   ID:  [name], DOB [birth year], MRN [id number]  PCP:  Melinda Crutch, MD  Cardiologist: Sinclair Grooms, MD   Chief Complaint  Patient presents with  . Coronary Artery Disease    History of Present Illness:  [name] is a 83 y.o. male who presents for CAD with prior coronary bypass grafting, COPD with cor pulmonale, hyperlipidemia, hypertension, PAD, and history of carotid obstructive disease  Multiple nonspecific complaints. Feels groggy. Feels apprehensive and worried about his overall condition. He denies chest pain. No sustained tachycardia arrhythmias. Trace left greater than right ankle edema. No nitroglycerin use. Wears oxygen 24/7. Feels depressed.  Past Medical History:  Diagnosis Date  . Asthma   . BPH (benign prostatic hyperplasia)    severe; s/p multiple biopsies  . CAD (coronary artery disease)   . Carotid artery occlusion   . Chronic rhinitis   . COPD (chronic obstructive pulmonary disease) (HCC)    2L [place] O2  . Hyperlipidemia   . Hypertension   . Peripheral vascular disease ([place])   . 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 Syncope and collapse   . Ureteral tumor 08/2015   had endoscopic procedure for evaluation, unable to reach for biopsy    Past Surgical History:  Procedure Laterality Date  . CATARACT EXTRACTION  11-20-11  . CORONARY ARTERY BYPASS GRAFT  2003  . LITHOTRIPSY    . PROSTATE BIOPSY  Oct. 2014    Current Medications: Outpatient Medications Prior to Visit  Medication Sig Dispense Refill  . ALPRAZolam (XANAX) 0.25 MG tablet Take 0.25 mg by mouth 2 (two) times daily as needed for anxiety. Takes 1/2 tablet two times daily.    Marland Kitchen aspirin 81 MG tablet Take 81 mg by mouth daily.    Marland Kitchen atorvastatin (LIPITOR) 20 MG tablet Take 20 mg by mouth daily at 6 PM.    . azithromycin (ZITHROMAX) 250 MG tablet Take as directed for flare up    . b complex vitamins tablet Take 1 tablet by mouth daily.    . Cholecalciferol  (VITAMIN D3) 2000 units TABS Take 2,000 Units by mouth daily.    . finasteride (PROSCAR) 5 MG tablet Take 5 mg by mouth every morning.    . furosemide (LASIX) 20 MG tablet Take 20 mg by mouth daily as needed.    Marland Kitchen guaiFENesin (MUCINEX) 600 MG 12 hr tablet Take by mouth 2 (two) times daily as needed.    . hydrALAZINE (APRESOLINE) 25 MG tablet Take 1 tablet (25 mg total) by mouth every 12 (twelve) hours as needed (for BP >170 systolic or > 017 diastolic). If your BP is 494 systolic you may take one dose of the hydralazine that you have at home.  But would not do this more than twice a day.    . levalbuterol (XOPENEX) 0.63 MG/3ML nebulizer solution Take 3 mLs (0.63 mg total) by nebulization every 4 (four) hours as needed for wheezing or shortness of breath. 300 mL 12  . loratadine (CLARITIN) 10 MG tablet Take 10 mg by mouth daily as needed for allergies.    . metoprolol succinate (TOPROL XL) 25 MG 24 hr tablet TAKE 1 TABLET(25 MG) BY MOUTH DAILY AT NIGHT 30 tablet 10  . mometasone (NASONEX) 50 MCG/ACT nasal spray Place 2 sprays into the nose daily.    . Multiple Vitamin (MULTIVITAMIN) tablet Take 1 tablet by mouth daily.    Marland Kitchen  NITROSTAT 0.4 MG SL tablet DISSOLVE 1 TABLET UNDER THE TONGUE AS DIRECTED AND AS NEEDED FOR CHEST PAIN 25 tablet 5  . OXYGEN Take 2 L by mouth daily as needed. 2 l/m at bedtime and as needed for shortness of breath    . SPIRIVA RESPIMAT 2.5 MCG/ACT AERS INHALE 2 PUFFS BY MOUTH EVERY DAY 1 Inhaler 11  . SYMBICORT 160-4.5 MCG/ACT inhaler INHALE 2 PUFFS BY MOUTH EVERY MORNING AND THEN ANOTHER 2 PUFFS ABOUT 12 HOURS LATER 10.2 g 11  . XOPENEX HFA 45 MCG/ACT inhaler INHALE 2 PUFFS BY MOUTH EVERY 4 HOURS AS NEEDED FOR WHEEZING OR SHORTNESS OF BREATH 15 g 2  . fludrocortisone (FLORINEF) 0.1 MG tablet Take half a tablet on Sundays (Patient not taking: Reported on 03/27/2016) 15 tablet 3  . ipratropium (ATROVENT) 0.03 % nasal spray Place 2 sprays into both nostrils 4 (four) times daily.  (Patient not taking: Reported on 03/27/2016) 30 mL 1  . omeprazole (PRILOSEC) 40 MG capsule TAKE 1 CAPSULE(40 MG) BY MOUTH TWICE DAILY BEFORE A MEAL (Patient not taking: Reported on 03/27/2016) 60 capsule 5  . predniSONE (DELTASONE) 10 MG tablet Take 0.5 tablets (5 mg total) by mouth 2 (two) times daily with a meal. (Patient not taking: Reported on 02/14/2016) 30 tablet 5  . predniSONE (DELTASONE) 5 MG tablet 1/2 tab at Bfst and dinner (Patient not taking: Reported on 03/27/2016) 30 tablet 3  . XOPENEX HFA 45 MCG/ACT inhaler INHALE 2 PUFFS BY MOUTH EVERY 4 HOURS AS NEEDED FOR WHEEZING OR SHORTNESS OF BREATH (Patient not taking: Reported on 03/27/2016) 15 g 2   No facility-administered medications prior to visit.      Allergies:   Sulfonamide derivatives; Cefdinir; Ciprofloxacin; Nitrofurantoin; and Levaquin [levofloxacin]   Social History   Social History  . Marital status: Married    Spouse name: N/A  . Number of children: 2  . Years of education: N/A   Occupational History  . 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 Smoking status: Former Smoker    Packs/day: 2.00    Years: 33.00    Types: Cigarettes    Quit date: 01/09/1982  . Smokeless tobacco: Never Used  . Alcohol use 0.0 oz/week     Comment: 1.5-3 oz daily in the past, minimal use in the last few months  . Drug use: No  . Sexual activity: Not Asked   Other Topics Concern  . None   Social History Narrative   Pt lives at home with his spouse.           Family History:  The patient's family history includes Allergies in his brother; Breast cancer in his mother; Cancer in his mother; Heart attack in his father; Heart disease in his brother, brother, father, and mother; Hyperlipidemia in his brother; Hypertension in his brother and mother; Other in his mother; Peripheral vascular disease in his brother; Stroke in his brother.   ROS:   Please see the history of present illness.     Multiple complaints including chills, vision disturbance, abdominal discomfort, depression, back discomfort, dizziness, anxiety, chest pressure, and fatigue. He wears a Trilogy 100 to help his breathing.  All other systems reviewed and are negative.   PHYSICAL EXAM:   VS:  BP (!) 154/76 (BP Location: Left Arm)   Pulse 96   Ht 5\' 9"  (1.753 m)   Wt 134 lb 3.2 oz (60.9 kg)   BMI 19.82  kg/m    GEN: Well nourished, well developed, in no acute distress  HEENT: normal  Neck: no JVD, carotid bruits, or masses Cardiac: RRR; no murmurs, rubs, or gallops. Absent distal pulses. Respiratory:  clear to auscultation bilaterally, normal work of breathing GI: soft, nontender, nondistended, + BS MS: no deformity or atrophy  Skin: warm and dry, no rash Neuro:  Alert and Oriented x 3, Strength and sensation are intact Psych: euthymic mood, full affect  Wt Readings from Last 3 Encounters:  03/27/16 134 lb 3.2 oz (60.9 kg)  02/14/16 138 lb (62.6 kg)  02/09/16 137 lb 6.4 oz (62.3 kg)      Studies/Labs Reviewed:   EKG:  EKG  Not repeated.  Recent Labs: 04/15/2015: Pro B Natriuretic peptide (BNP) 71.0 09/12/2015: ALT 18 09/13/2015: TSH 3.067 09/24/2015: Hemoglobin 13.4; Platelets 205 09/25/2015: Magnesium 2.0 10/27/2015: BUN 19; Creatinine, Ser 0.95; Potassium 4.2; Sodium 139   Lipid Panel    Component Value Date/Time   CHOL 135 09/13/2015 0320   TRIG 45 09/13/2015 0320   HDL 74 09/13/2015 0320   CHOLHDL 1.8 09/13/2015 0320   VLDL 9 09/13/2015 0320   LDLCALC 52 09/13/2015 0320    Additional studies/ records that were reviewed today include:  Stable    ASSESSMENT:    1. Peripheral vascular disease ([place])   2. Essential hypertension   3. Coronary artery disease involving coronary bypass graft of native heart with angina pectoris ([place])   4. Chronic hypoxemic respiratory failure (HCC)   5. Chronic respiratory failure with hypoxia and hypercapnia (HCC)   6. Other hyperlipidemia       PLAN:  In order of problems listed above:  1. No complaints compatible with claudication. 2. Well-controlled blood pressure. 3. CAD stable with stable angina. Rare nitroglycerin use. 4. This is his major chronic problem. He is on 24 7 oxygen. 5. When last evaluated, lipids were adequate. Need to be repeated along with a liver panel in September 2018.  Overall, chronically ill with severe respiratory failure. Cardiac status is stable. Clinical follow-up 1 year.  Medication Adjustments/Labs and Tests Ordered: Current medicines are reviewed at length with the patient today.  Concerns regarding medicines are outlined above.  Medication changes, Labs and Tests ordered today are listed in the Patient Instructions below. There are no Patient Instructions on file for this visit.   Signed, Sinclair Grooms, MD  03/27/2016 3:30 PM    [address], [place], [place]  [address] Phone: [phone]; Fax: [phone]

## 2016-03-27 ENCOUNTER — Encounter: Payer: Self-pay | Admitting: Interventional Cardiology

## 2016-03-27 ENCOUNTER — Ambulatory Visit (INDEPENDENT_AMBULATORY_CARE_PROVIDER_SITE_OTHER): Payer: Medicare Other | Admitting: Interventional Cardiology

## 2016-03-27 VITALS — BP 154/76 | HR 96 | Ht 69.0 in | Wt 134.2 lb

## 2016-03-27 DIAGNOSIS — E784 Other hyperlipidemia: Secondary | ICD-10-CM

## 2016-03-27 DIAGNOSIS — I739 Peripheral vascular disease, unspecified: Secondary | ICD-10-CM | POA: Diagnosis not present

## 2016-03-27 DIAGNOSIS — I10 Essential (primary) hypertension: Secondary | ICD-10-CM

## 2016-03-27 DIAGNOSIS — J9611 Chronic respiratory failure with hypoxia: Secondary | ICD-10-CM | POA: Diagnosis not present

## 2016-03-27 DIAGNOSIS — I25709 Atherosclerosis of coronary artery bypass graft(s), unspecified, with unspecified angina pectoris: Secondary | ICD-10-CM

## 2016-03-27 DIAGNOSIS — E7849 Other hyperlipidemia: Secondary | ICD-10-CM

## 2016-03-27 DIAGNOSIS — I209 Angina pectoris, unspecified: Secondary | ICD-10-CM | POA: Diagnosis not present

## 2016-03-27 NOTE — Patient Instructions (Signed)
Medication Instructions:  None  Labwork: None  Testing/Procedures: None  Follow-Up: Your physician wants you to follow-up in: 1 year with Dr. Smith.  You will receive a reminder letter in the mail two months in advance. If you don't receive a letter, please call our office to schedule the follow-up appointment.   Any Other Special Instructions Will Be Listed Below (If Applicable).     If you need a refill on your cardiac medications before your next appointment, please call your pharmacy.   

## 2016-03-31 ENCOUNTER — Other Ambulatory Visit (INDEPENDENT_AMBULATORY_CARE_PROVIDER_SITE_OTHER): Payer: Medicare Other

## 2016-03-31 DIAGNOSIS — I951 Orthostatic hypotension: Secondary | ICD-10-CM

## 2016-03-31 LAB — BASIC METABOLIC PANEL
BUN: 16 mg/dL (ref 6–23)
CO2: 32 mEq/L (ref 19–32)
Calcium: 9.6 mg/dL (ref 8.4–10.5)
Chloride: 102 mEq/L (ref 96–112)
Creatinine, Ser: 1.02 mg/dL (ref 0.40–1.50)
GFR: 73.84 mL/min (ref >60.00)
Glucose, Bld: 94 mg/dL (ref 70–99)
Potassium: 4.7 mEq/L (ref 3.5–5.1)
Sodium: 140 mEq/L (ref 135–145)

## 2016-04-05 ENCOUNTER — Ambulatory Visit (INDEPENDENT_AMBULATORY_CARE_PROVIDER_SITE_OTHER): Payer: Medicare Other | Admitting: Endocrinology

## 2016-04-05 ENCOUNTER — Encounter: Payer: Self-pay | Admitting: Endocrinology

## 2016-04-05 VITALS — BP 118/58 | HR 81 | Ht 69.0 in | Wt 134.0 lb

## 2016-04-05 DIAGNOSIS — I25709 Atherosclerosis of coronary artery bypass graft(s), unspecified, with unspecified angina pectoris: Secondary | ICD-10-CM

## 2016-04-05 DIAGNOSIS — I951 Orthostatic hypotension: Secondary | ICD-10-CM

## 2016-04-05 NOTE — Patient Instructions (Signed)
Prednisone early am and 12 hrs later

## 2016-04-05 NOTE — Progress Notes (Signed)
Patient ID: [name], male   DOB: November 04, 1929, 83 y.o.   MRN: [id number]                  Chief complaint: Follow-up of low blood pressure  History of Present Illness:   The patient was felt to have adrenal insufficiency when he was admitted with orthostatic hypotension on 09/12/15. Previous symptoms were lightheadedness on standing up and not feeling good for about 2 weeks prior to this  Had no symptoms of  nausea,  decreased appetite or known electrolyte disturbance but had lost about 10-15 pounds of weight loss over a few months Patient had been on 5 mg of prednisone for about 8 months prior to this admission for his COPD   During his hospitalization he was treated with fluids and IV steroids and then discharged on prednisone 20 mg without any specific evaluation for cortisol levels or a Cortrosyn test.  Recent history:  On his initial consultation he was told to try Florinef and blood pressure was below 332 systolic standing up Patient monitors the blood pressure at home with an [employer] meter  On his last visit because of periodic low blood pressure episodes and symptoms of lightheadedness especially in the morning he was told to try taking Florinef regularly once a week with half tablet He has done this almost every week However he was also told to take prednisone 2.5 mg twice a day instead of 5 mg in the morning only He says that his blood pressure has not been low and the lowest reading he has seen his 99 standing Also has not had higher readings than normal also Again usually the blood pressures are relatively lower in the morning than later in the day   PREDNISONE dose: He is currently on 5 mg in divided doses He still has severe COPD but not needing any extra doses of steroids  He does complain as before of fatigue, lethargy and sleepiness and has a hard time getting started in the mornings but usually feels relatively better after breakfast Apparently has not had any  cause identified by his PCP He is asking about checking his blood sugar in the morning when he wakes up, has not had any documented hyperglycemia in the past   Wt Readings from Last 3 Encounters:  04/05/16 134 lb (60.8 kg)  03/27/16 134 lb 3.2 oz (60.9 kg)  02/14/16 138 lb (62.6 kg)    Lab Results  Component Value Date   CREATININE 1.02 03/31/2016   BUN 16 03/31/2016   NA 140 03/31/2016   K 4.7 03/31/2016   CL 102 03/31/2016   CO2 32 03/31/2016      Past Medical History:  Diagnosis Date  . Asthma   . BPH (benign prostatic hyperplasia)    severe; s/p multiple biopsies  . CAD (coronary artery disease)   . Carotid artery occlusion   . Chronic rhinitis   . COPD (chronic obstructive pulmonary disease) (HCC)    2L  O2  . Hyperlipidemia   . Hypertension   . Peripheral vascular disease ([place])   . 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 Syncope and collapse   . Ureteral tumor 08/2015   had endoscopic procedure for evaluation, unable to reach for biopsy    Past Surgical History:  Procedure Laterality Date  . CATARACT EXTRACTION  11-20-11  . CORONARY ARTERY BYPASS GRAFT  2003  . LITHOTRIPSY    . PROSTATE BIOPSY  Oct. 2014  Family History  Problem Relation Age of Onset  . Heart disease Mother   . Breast cancer Mother   . Cancer Mother     Breast cancer  . Hypertension Mother   . Other Mother     AAA  and   Amputation  . Heart disease Father     Before age 5  . Heart attack Father   . Stroke Brother     x3, still living   . Peripheral vascular disease Brother   . Heart disease Brother   . Hyperlipidemia Brother   . Hypertension Brother   . Heart disease Brother   . Allergies Brother     Social History:  reports that he quit smoking about 34 years ago. His smoking use included Cigarettes. He has a 66.00 pack-year smoking history. He has never used smokeless tobacco. He reports that he drinks alcohol. He reports that he does not use drugs.  Allergies:  Allergies    Allergen Reactions  . Sulfonamide Derivatives Swelling    REACTION: facial/tongue swelling  . Cefdinir Diarrhea    REACTION: diarrhea  . Ciprofloxacin Itching and Rash    Red itchy hands  . Nitrofurantoin Swelling    Swollen hands  . Levaquin [Levofloxacin] Itching and Rash    Allergies as of 04/05/2016      Reactions   Sulfonamide Derivatives Swelling   REACTION: facial/tongue swelling   Cefdinir Diarrhea   REACTION: diarrhea   Ciprofloxacin Itching, Rash   Red itchy hands   Nitrofurantoin Swelling   Swollen hands   Levaquin [levofloxacin] Itching, Rash      Medication List       Accurate as of 04/05/16  3:26 PM. Always use your most recent med list.          ALPRAZolam 0.25 MG tablet Commonly known as:  XANAX Take 0.25 mg by mouth 2 (two) times daily as needed for anxiety. Takes 1/2 tablet two times daily.   aspirin 81 MG tablet Take 81 mg by mouth daily.   atorvastatin 20 MG tablet Commonly known as:  LIPITOR Take 20 mg by mouth daily at 6 PM.   azithromycin 250 MG tablet Commonly known as:  ZITHROMAX Take as directed for flare up   b complex vitamins tablet Take 1 tablet by mouth daily.   finasteride 5 MG tablet Commonly known as:  PROSCAR Take 5 mg by mouth every morning.   fludrocortisone 0.1 MG tablet Commonly known as:  FLORINEF Take 0.1 mg by mouth daily as needed (BP < 100 STANDING).   furosemide 20 MG tablet Commonly known as:  LASIX Take 20 mg by mouth daily as needed.   guaiFENesin 600 MG 12 hr tablet Commonly known as:  MUCINEX Take by mouth 2 (two) times daily as needed.   hydrALAZINE 25 MG tablet Commonly known as:  APRESOLINE Take 1 tablet (25 mg total) by mouth every 12 (twelve) hours as needed (for BP >979 systolic or > 892 diastolic). If your BP is 119 systolic you may take one dose of the hydralazine that you have at home.  But would not do this more than twice a day.   levalbuterol 0.63 MG/3ML nebulizer solution Commonly  known as:  XOPENEX Take 3 mLs (0.63 mg total) by nebulization every 4 (four) hours as needed for wheezing or shortness of breath.   loratadine 10 MG tablet Commonly known as:  CLARITIN Take 10 mg by mouth daily as needed for allergies.   metoprolol succinate  25 MG 24 hr tablet Commonly known as:  TOPROL XL TAKE 1 TABLET(25 MG) BY MOUTH DAILY AT NIGHT   mometasone 50 MCG/ACT nasal spray Commonly known as:  NASONEX Place 2 sprays into the nose daily.   multivitamin tablet Take 1 tablet by mouth daily.   NITROSTAT 0.4 MG SL tablet Generic drug:  nitroGLYCERIN DISSOLVE 1 TABLET UNDER THE TONGUE AS DIRECTED AND AS NEEDED FOR CHEST PAIN   OXYGEN Take 2 L by mouth daily as needed. 2 l/m at bedtime and as needed for shortness of breath   predniSONE 5 MG tablet Commonly known as:  DELTASONE Take 2.5 mg by mouth 2 (two) times daily.   SPIRIVA RESPIMAT 2.5 MCG/ACT Aers Generic drug:  Tiotropium Bromide Monohydrate INHALE 2 PUFFS BY MOUTH EVERY DAY   SYMBICORT 160-4.5 MCG/ACT inhaler Generic drug:  budesonide-formoterol INHALE 2 PUFFS BY MOUTH EVERY MORNING AND THEN ANOTHER 2 PUFFS ABOUT 12 HOURS LATER   Vitamin D3 2000 units Tabs Take 2,000 Units by mouth daily.   XOPENEX HFA 45 MCG/ACT inhaler Generic drug:  levalbuterol INHALE 2 PUFFS BY MOUTH EVERY 4 HOURS AS NEEDED FOR WHEEZING OR SHORTNESS OF BREATH       LABS:  Lab on 03/31/2016  Component Date Value Ref Range Status  . Sodium 03/31/2016 140  135 - 145 mEq/L Final  . Potassium 03/31/2016 4.7  3.5 - 5.1 mEq/L Final  . Chloride 03/31/2016 102  96 - 112 mEq/L Final  . CO2 03/31/2016 32  19 - 32 mEq/L Final  . Glucose, Bld 03/31/2016 94  70 - 99 mg/dL Final  . BUN 03/31/2016 16  6 - 23 mg/dL Final  . Creatinine, Ser 03/31/2016 1.02  0.40 - 1.50 mg/dL Final  . Calcium 03/31/2016 9.6  8.4 - 10.5 mg/dL Final  . GFR 03/31/2016 73.84  >60.00 mL/min Final     Review of Systems   PHYSICAL EXAM:  BP 140/62   Pulse  81   Ht 5\' 9"  (1.753 m)   Wt 134 lb (60.8 kg)   SpO2 94%   BMI 19.79 kg/m   Repeat standing blood pressure left arm 144/66  No edema  ASSESSMENT:   History of orthostatic hypotension, likely to be some autonomic dysfunction He has had No episodes of low blood pressure Not clear this is related to splitting the prednisone to 2.5 mg twice a day instead of 5 mg in the morning only He is taking only minimal doses of Florinef and last Sunday did not even take it  Although he is concerned about possible low blood sugar when he feels sluggish and tired in the morning this is unlikely   PLAN:    Continue  Florinef low-dose with 0.05 mg every Sunday.  He will try to take a blood sugar in the morning when he feels lethargic and weak, he was given the contour meter with 10 test strips.  His wife is a . help her monitor this  He is still continue prednisone half tablet twice a day   Continue follow-up with PCP for various other issues and also discuss why he feels lethargic and weak   There are no Patient Instructions on file for this visit.   Krystn Dermody 04/05/2016, 3:26 PM

## 2016-04-17 ENCOUNTER — Ambulatory Visit (INDEPENDENT_AMBULATORY_CARE_PROVIDER_SITE_OTHER): Payer: Medicare Other | Admitting: Internal Medicine

## 2016-04-17 ENCOUNTER — Encounter: Payer: Self-pay | Admitting: Internal Medicine

## 2016-04-17 VITALS — BP 136/62 | HR 95 | Ht 69.0 in | Wt 135.4 lb

## 2016-04-17 DIAGNOSIS — J449 Chronic obstructive pulmonary disease, unspecified: Secondary | ICD-10-CM

## 2016-04-17 DIAGNOSIS — J9611 Chronic respiratory failure with hypoxia: Secondary | ICD-10-CM

## 2016-04-17 DIAGNOSIS — I25709 Atherosclerosis of coronary artery bypass graft(s), unspecified, with unspecified angina pectoris: Secondary | ICD-10-CM

## 2016-04-17 DIAGNOSIS — J9612 Chronic respiratory failure with hypercapnia: Secondary | ICD-10-CM | POA: Diagnosis not present

## 2016-04-17 DIAGNOSIS — J302 Other seasonal allergic rhinitis: Secondary | ICD-10-CM | POA: Diagnosis not present

## 2016-04-17 NOTE — Patient Instructions (Signed)
Nasal steroids like nasonex have no immediate benefit in terms of improving symptoms.  To help them reached the target tissue, you  should use Afrin two puffs every 12 hours applied one min before using the nasal steroids.  Afrin should be stopped after no more than 5 days.  If the symptoms worsen, Afrin can be restarted after 5 days off of therapy to prevent rebound congestion from overuse of Afrin.  I also emphasized that in no way are nasal steroids a concern in terms of "addiction".    See calendar for specific medication instructions and bring it back for each and every office visit for every healthcare provider you see.  Without it,  you may not receive the best quality medical care that we feel you deserve.  You will note that the calendar groups together  your maintenance  medications that are timed at particular times of the day.  Think of this as your checklist for what your doctor has instructed you to do until your next evaluation to see what benefit  there is  to staying on a consistent group of medications intended to keep you well.  The other group at the bottom is entirely up to you to use as you see fit  for specific symptoms that may arise between visits that require you to treat them on an as needed basis.  Think of this as your action plan or "what if" list.   Separating the top medications from the bottom group is fundamental to providing you adequate care going forward.    Please schedule a follow up visit in 3 months but call sooner if needed

## 2016-04-17 NOTE — Progress Notes (Signed)
Subjective:    Patient ID: KINDRED REIDINGER, male    DOB: 11/27/1929   MRN: [id number]     Summary: Pulmonary/ f/u ov try astepro for pnds   Primary Provider/Referring Provider: [name]    Brief patient profile:  56  yowm last smoked 1983 with GOLD III COPD with some reversibility and 02 dep at hs and with activity    History of Present Illness   10/19/2014  f/u ov/Zoha Spranger re: GOLD III / maint rx symbicort/ spiriva Chief Complaint  Patient presents with  . Follow-up    Pt c/o fatigue and increased chest congestion for the past several wks. He has am cough with white sputum. He is using xopenex 2 x per day on average.    on 10/8 started pred 20 and zpak as per med calendar action plan  gxt flat x 19mph x 15 min and zero grade on 2lpm with sats mid 90s at home  >decrease pred 5mg    11/19/2014 NP Follow up : COPD GOLD III  Returns for follow up .  Says he had a flare 4 days ago with increased SOB, occasional dry cough, chest tightness. Sinus pressure and drainage. Denies fever, wheezing, nausea or vomiting. Started on Zpack and prednisone 20mg  yesterday .  Feels better   rec Follow med calendar closely and bring to each visit.  Finish Zpack as directed.  Taper prednisone and hold at 5mg  daily  Please contact office for sooner follow up if symptoms do not improve or worsen or seek emergency care        04/15/2015  f/u ov/Tashona Calk re: GOLD III/ hypercarbic/ maint rx symb and spiriva and pred floor of 10 mg and 02 lpm 24/7  Chief Complaint  Patient presents with  . Follow-up    Pt c/o increased nasal congestion and SOB x 2 wks. He is coughing more and producing minmal clear to white sputum.  He is using xopenex inhaler 2-3 x per day.    Wakes up feeling really lousy, mild ha and sluggish despite neg osa eval in past Not using flutter and quite congested in am as well  Doe = MMRC3 = can't walk 100 yards even at a slow pace at a flat grade s stopping due to sob  Even on 02/ not using  treadmill any more  >>cont current regimen     05/21/15 Eval by Dr Dedio/ rec trial of NIV   06/30/2015  f/u ov/Lacreasha Hinds re: GOLD IV copd/ 02 dep hypercarbic/hypoxemic on symb 160/spiriva Chief Complaint  Patient presents with  . Acute Visit    Pt c/o increased DOE for the past month. He is also c/o unintended wt loss over the past 3-4 months.    most of his problem is fatigue, not limiting sob, and has yet to be able to use NIV for more than a few min to maybe an hour/ very inactive rec Goal with the 02 is keep sats over 90%  When breathing worse go ahead and double prednisone dose as per the med calendar      Admit date: 09/24/2015 Discharge date: 09/26/2015 Primary Cardiologist:Dr. Tamala Julian   Discharge Diagnoses:  Principal Problem:   Angina at rest [facility]) Active Problems:   Essential hypertension   COPD GOLD IV/ 02 dep /steroid dep   Chronic respiratory failure with hypoxia and hypercapnia (HCC)   CAD (coronary artery disease)   Chest pain   Orthostatic hypotension not corrected on prednisone 20 mg daily    10/04/2015  Post hosp f/u ov/Claire Bridge re:  Prednisone 20 mg daily  Plus symb 160 2bid/ spiriva respimat / flutter valve/ has still not started astral noct vent / 02 2lpm 24/7  Chief Complaint  Patient presents with  . Follow-up    Pt. wants to discuss being on Tiotropium and Pulmicort, breathing has not been better,been in the hospital twice, some coughing in the morning   main complaint is feeling lousy when stands up, not sob/ while  maintaining per cards  on pred 20 mg daily while still on multiple bp meds for which he is getting confused in terms of how to use.  No improvement in orthostasis on 20 mg per day (looks like also tried florinef prior to the being maint on higher "floor" prednisone dose.  Was waking up feeling flushed with high BP but apparently never happened on NIV as never used it "too much else goingtlined above  rec Continue prednisone 20mg  daily until breathing  better then 10 mg daily as per your med calendar  Put the new blood pressure medicines in pencil on your med calendar Go ahead and use the Astral as much as you can until see Dr Murlean Iba        04/17/2016  f/u ov/Corey Laski re:   COPD IV/ 02 dep /  maint  on symb/spiriva  Chief Complaint  Patient presents with  . Follow-up    COPD, more mucus production in his nasal passenges, hard to get air through the cannula, constantly out of breathe, he cant tell if the nebulizer medications ae helping him   still exercising daily x 10 min s stopping at 34mph flat but can't walk at [place] "I guess I walk too fast" sats are tu[oca;;u pl pm 2 lpm  Lots of nasal congestion, no using afrin or increase nasonex per action plan   No obvious day to day or daytime variability or assoc excess/ purulent sputum or mucus plugs or hemoptysis or cp or chest tightness, subjective wheeze or overt sinus or hb symptoms. No unusual exp hx or h/o childhood pna/ asthma or knowledge of premature birth.  Sleeping ok without nocturnal  or early am exacerbation  of respiratory  c/o's or need for noct saba. Also denies any obvious fluctuation of symptoms with weather or environmental changes or other aggravating or alleviating factors except as outlined above   Current Medications, Allergies, Complete Past Medical History, Past Surgical History, Family History, and Social History were reviewed in [employer] record.  ROS  The following are not active complaints unless bolded sore throat, dysphagia, dental problems, itching, sneezing,  nasal congestion or excess/ purulent secretions, ear ache,   fever, chills, sweats, unintended wt loss, classically pleuritic or exertional cp,  orthopnea pnd or leg swelling controlled on lasix prn, presyncope, palpitations, abdominal pain, anorexia, nausea, vomiting, diarrhea  or change in bowel or bladder habits, change in stools or urine, dysuria,hematuria,  rash, arthralgias, visual  complaints, headache, numbness, weakness or ataxia or problems with walking or coordination,  change in mood/affect or memory.                    Past Medical History:  CHRONIC RHINITIS (ICD-472.0)  - Sinus ct March 03, 2010 > thickening only, no acute changes  CHRONIC RESPIRATORY FAILURE (ICD-518.83)  - 02 rx since 2008  COPD UNSPECIFIED (ICD-496)....................................................................[name]  - PFTs 05/04/05 FEV1 36% ratio 28% with 29% improvement after bronchodilators DLCO 48%  - PFT's 03/26/08 30% ratio 34  with 14% improvement after bronchodilators DLC0 38 %  - Start noct 02 at 2lpm 2008 and on 03/04/09 desat @ > 185 ft so rec wear with activtiy > rm to rm  - [facility] June 30, 2008 100% > confirmed March 04, 2009  ISCHEMIC HEART DISEASE (ICD-414.9)  HYPERTENSION (ICD-401.9)  HYPERLIPIDEMIA (ICD-272.4)  SPN RUL by cxr August 19, 2009  COMPLEX MED REGIMEN-Meds reviewed with pt education and computerized med calendar 11/22/09 03/10/2011  , 10/15/2012  07/17/2013 , 12/09/2013 , 06/15/2014 , 11/19/14 , 5.11.17          Objective:   Physical Exam  amb wm nad - frail / vital signs reviewed  - Note on arrival 02 sats  97% on 2lpm       wt 159 04/28/2010  > 10/14/2010 154  > 10/04/2011 150> 12/21/2011  150 > 149 01/29/2012 >154 04/05/2012 > 05/22/2012 148 > 09/30/2012  146 >>144> 10/15/2012 > 12/18/2012  145 > 148 02/14/2013 >148 03/14/2013 > 04/25/2013 148 > 06/19/2013 150 >>07/17/2013 > 08/27/2013 148 > 10/23/2013  148 >153 12/09/2013 > 02/26/2014  153 > 06/01/2014  152 > 157 06/15/2014 > 09/21/2014 146 > 10/19/2014 151 >>148 11/19/14 > 02/24/2015 148 > 06/30/2015    140 >  10/04/2015  136 > 12/20/2015   142    HEENT  mod nonspecific turbinate edema.  Oropharynx no thrush or excess pnd or cobblestoning.  No JVD or cervical adenopathy. Mild accessory muscle hypertrophy. Trachea midline, nl thryroid. Chest was hyperinflated by percussion with diminished breath sounds and moderate increased  exp time without wheeze bilaterally .   Regular rate and rhythm without murmur gallop or rub or increase P2 -  trace  bilateral lower ext pitting edema R > L .   Abd: no hsm, nl excursion. Ext warm without cyanosis or clubbing.

## 2016-04-18 NOTE — Assessment & Plan Note (Signed)
-   Start noct 02 at 2lpm 2008 and on 03/04/09 desat @ > 185 ft so rec wear with activtiy > rm to rm  - 02/15/2011   Walked RA x one lap @ 185 stopped due to  desat > corrected on 2lpm - 02/26/2014  Walked 2lpm  2 laps @ 185 ft each stopped due to  Sob/ sats 88% at nl pace  - 10/19/2014   Walked RA  2 laps @ 185 ft each stopped due to  End of study, slow pace,  sob  But no  desat   - HCO3  10/27/15 = 34   rx as of 04/17/2016   = 2lpm 24/7  / NIV per  by Dr Corrie Dandy  There has been minimal discernible change on noct vent vs off so rec he try off x one week prior to next ov with Dr Corrie Dandy to decide whether change in rx needed

## 2016-04-18 NOTE — Assessment & Plan Note (Signed)
Sinus CT 03/03/10 chronic changes only  - rec rechallenge with qnasal 12/21/11  - Trial of 1st gen h1 and hs h2 rec 05/23/2012  - Allergy profile 12/03/2012-negative-IgE 94.8     Food IgE profile-negative - rec rechallnge with 1st gen H1 06/01/14 > could not tolerate either 1st gen H1   nor Zyrtec so recommended go back to Claritin prn 10/19/2014   I emphasized that nasal steroids have no immediate benefit in terms of improving symptoms.  To help them reached the target tissue, the patient should use Afrin two puffs every 12 hours applied one min before using the nasal steroids.  Afrin should be stopped after no more than 5 days.  If the symptoms worsen, Afrin can be restarted after 5 days off of therapy to prevent rebound congestion from overuse of Afrin.  I also emphasized that in no way are nasal steroids a concern in terms of "addiction".

## 2016-04-18 NOTE — Assessment & Plan Note (Signed)
-   PFTs 05/04/05 FEV1 36% ratio 28% with 29% improvement after bronchodilators DLCO 48%  - PFT's 03/26/08 30% ratio 34 with 14% improvement after bronchodilators DLC0 38 %  - Referred to rehab 12/19/2012 > completed March 2015  - changed to spiriva respimat 02/14/2013  - started daily prednisone 06/19/13 > improved only a little> tapered off mid July 2015 > ok to change to prn prednisone 08/27/2013  - flared off nasonex and gerd rx 09/21/2014 > resumed - 10/19/2014  p extensive coaching HFA effectiveness =    90%  - changed pred to ceiling of 20 / floor of 5 mg daily as of 10/19/2014 > changed to floor of 10 mg daily 10/04/2015  - PFT's  06/08/2015  FEV1 0.58 (22 % ) ratio 27  p No sign % improvement from saba p symb/spiriva prior to study with DLCO  9/9 % corrects to 27 % for alv volume     Main new problem is nasal congestion not a copd exac so rec no change in rx   I had an extended discussion with the patient reviewing all relevant studies completed to date and  lasting 15 to 20 minutes of a 25 minute visit    Each maintenance medication was reviewed in detail including most importantly the difference between maintenance and prns and under what circumstances the prns are to be triggered using an action plan format that is not reflected in the computer generated alphabetically organized AVS but trather by a customized med calendar that reflects the AVS meds with confirmed 100% correlation.   In addition, Please see AVS for unique instructions that I personally wrote and verbalized to the the pt in detail and then reviewed with pt  by my nurse highlighting any  changes in therapy recommended at today's visit to their plan of care.

## 2016-04-25 ENCOUNTER — Other Ambulatory Visit: Payer: Self-pay | Admitting: Interventional Cardiology

## 2016-04-26 DIAGNOSIS — J342 Deviated nasal septum: Secondary | ICD-10-CM | POA: Diagnosis not present

## 2016-04-26 DIAGNOSIS — J310 Chronic rhinitis: Secondary | ICD-10-CM | POA: Diagnosis not present

## 2016-04-26 DIAGNOSIS — H903 Sensorineural hearing loss, bilateral: Secondary | ICD-10-CM | POA: Diagnosis not present

## 2016-04-26 DIAGNOSIS — J343 Hypertrophy of nasal turbinates: Secondary | ICD-10-CM | POA: Diagnosis not present

## 2016-04-26 DIAGNOSIS — H838X3 Other specified diseases of inner ear, bilateral: Secondary | ICD-10-CM | POA: Diagnosis not present

## 2016-05-01 DIAGNOSIS — F419 Anxiety disorder, unspecified: Secondary | ICD-10-CM | POA: Diagnosis not present

## 2016-05-01 DIAGNOSIS — R531 Weakness: Secondary | ICD-10-CM | POA: Diagnosis not present

## 2016-05-03 ENCOUNTER — Ambulatory Visit: Payer: Medicare Other | Admitting: Internal Medicine

## 2016-05-03 DIAGNOSIS — C669 Malignant neoplasm of unspecified ureter: Secondary | ICD-10-CM | POA: Diagnosis not present

## 2016-05-03 DIAGNOSIS — R3915 Urgency of urination: Secondary | ICD-10-CM | POA: Diagnosis not present

## 2016-05-03 DIAGNOSIS — N401 Enlarged prostate with lower urinary tract symptoms: Secondary | ICD-10-CM | POA: Diagnosis not present

## 2016-05-10 DIAGNOSIS — C669 Malignant neoplasm of unspecified ureter: Secondary | ICD-10-CM | POA: Diagnosis not present

## 2016-05-10 DIAGNOSIS — C662 Malignant neoplasm of left ureter: Secondary | ICD-10-CM | POA: Diagnosis not present

## 2016-05-10 DIAGNOSIS — N200 Calculus of kidney: Secondary | ICD-10-CM | POA: Diagnosis not present

## 2016-05-22 DIAGNOSIS — R531 Weakness: Secondary | ICD-10-CM | POA: Diagnosis not present

## 2016-05-22 DIAGNOSIS — R634 Abnormal weight loss: Secondary | ICD-10-CM | POA: Diagnosis not present

## 2016-05-23 ENCOUNTER — Other Ambulatory Visit: Payer: Self-pay | Admitting: Interventional Cardiology

## 2016-05-30 ENCOUNTER — Ambulatory Visit (INDEPENDENT_AMBULATORY_CARE_PROVIDER_SITE_OTHER): Payer: Medicare Other | Admitting: Pulmonary Disease

## 2016-05-30 ENCOUNTER — Encounter: Payer: Self-pay | Admitting: Pulmonary Disease

## 2016-05-30 DIAGNOSIS — J9611 Chronic respiratory failure with hypoxia: Secondary | ICD-10-CM | POA: Diagnosis not present

## 2016-05-30 DIAGNOSIS — J449 Chronic obstructive pulmonary disease, unspecified: Secondary | ICD-10-CM | POA: Diagnosis not present

## 2016-05-30 DIAGNOSIS — I25709 Atherosclerosis of coronary artery bypass graft(s), unspecified, with unspecified angina pectoris: Secondary | ICD-10-CM | POA: Diagnosis not present

## 2016-05-30 DIAGNOSIS — J9612 Chronic respiratory failure with hypercapnia: Secondary | ICD-10-CM

## 2016-05-30 NOTE — Assessment & Plan Note (Addendum)
Patient has severe/very severe emphysema. Last PFT with an FEV1 of 36%. Diffusion capacity of 48%. (05/04/2005) Patient currently is on oxygen 2 L 24 7. In 2017, patient's COPD has been very difficult to control with frequent exacerbation requiring frequent office visits, prednisone, antibiotics. He is also now on oxygen 2 L 24/7. Prior to this, he was just on oxygen 2 L with exertion.  We started NIV Astral on this patient back in June 2017. Since starting it, he has had issues tolerating it because the pressures too much. He usually wakes up early morning with too much pressure, unable to breathe, suffocating, uncomfortable.   We switched his noninvasive ventilator from astral to Trilogy and he is tolerating that better. Pressures are better tolerated. His mask leak issues are better.  He is sleeping through the night with Trilogy.   He is now tolerating Trilogy well. He sleeps through the night.  We recently tweaked the machine. No issues with it.   Plan : 1. Cont NIV for now with 2L o2. He feels better over all with its use.  Not as many COPD exacerbations since using it. Feels benefit of NIV.  2. ONO on 2 L and NIV >> OK.  3. Currently he is on Symbicort, 160/4.5, 2 puffs twice a day and Spiriva respimat. He uses prn Duoneb.  4. UTD with vaccines (PNA and Flu).

## 2016-05-30 NOTE — Progress Notes (Signed)
Subjective:    Patient ID: [name], male    DOB: 10-10-1929, 83 y.o.   MRN: [id number]  HPI  Pt is being seen regularly by MW for very severe/severe COPD, chronic hypoxemic resp failure.   Pt has had 2 sleep studies. Last study was 03/2005 and AHI was <1.   Pt's COPD has been difficult to control. Pt with more frequent flare ups requiring higher prednisone +/- Abx. Pt has also been seeing/calling Dr. [name].   Also on 2L 24/7 the last 6 mos.   Gets 8-9hrs/night. No  No witnessed apneas. , gasping, choking.  Wakes up very tired.  Naps every now and then.  Occasional jerking of legs at sleep.   Follows with Cardiology. Sees Dr. Tamala Julian for CAD. CAD has been stable.   NIV (trilogy) started and is helping some.    ROV 09/09/15 Patient returns to the office as follow-up on his severe COPD, chronic hypoxemic respiratory failure, NIV use. He usually follows with Dr. [name]. Last time he saw her was back in June. Since I last saw him, he has been busy with another diagnosis. He was found to have urinary bladder tumor. At that time, he was visiting his son in [place]. He ended up getting a biopsy. It was a limited biopsy because of his comorbidities. The thought of doing surgery for the procedure but it was too risky and that has been held off. He subsequently also followed up with [facility] for the urinary bladder tumor. With regards to his breathing, its overall the same. He states he is able to use his inhalers. Regarding the ventilator, he has had issues since starting to use it. He feels the pressure is too much. He ends up waking up early morning, very comfortable, suffocating, unable to tolerate the pressure. He has called his DME med [facility] and they're supposed to follow up on him tomorrow. He uses oxygen with exertion, O2 saturation runs mid 90s. Has not been on antibiotics since last seen. Has nasal congestion and postnasal drip.  ROV  12/09/15 Patient returns to the office as follow-up on  his chronic hypoxemic respiratory failure and COPD. He usually sees Dr. [name] for his end-stage COPD. Last seen by Dr. [name] on September 25. Prior to that, I saw him and suggested trying nebulizer medicines. Nebulizer machine did not work for him. We switched him from an astral to a trilogy and he started using trilogy sometime in October. He feels the pressure is adequate but the mask is not comfortable. He has a full face mask. The mask gets dislodged frequently in the middle of the night causing him to wake up. Because of that, he is not able to sleep straight through the night. There are nights that he does not use that trilogy and he sleeps straight. He probably is more winded and sleepy during the days . He did not use the trilogy. Has not been admitted nor been on antibiotics since last seen. He is having issues with his blood pressure. Cardiology and endocrinology are managing that. He states his blood pressure is low at night and elevated during the daytime.  ROV 02/14/2016  Pt returns to the office as f/u on his chronic resp fx.  He was recently seen by TP for cough, congestion, wheezing, SOB.  He got Abx and prednisone. Sx are better except for almost continuous nasal drainage.  His NIV was switched from Astral to Trilogy and he loves his Trilogy!  Very quiet,  mask with minimal leak. Tolerating it well.  No issues with it.  He sleeps through the night.  His "fatigue" remains though when he wakes up.  It takes him several hours before he has the energy to start the day.  Remains on the same o2 requirement.   ROV 05/30/2016 patient returns to the office as follow-up on his chronic hypoxemic respiratory failure secondary to severe COPD. Since last seen, he has become tolerant to his Trilogy machine. He uses it every night. He is no longer aggravated by it. He is able to sleep with it throughout the night. Uses oxygen 2 L with his Trilogy. We recently adjusted the settings to make it more comfortable for  him.  Review of Systems  Constitutional: Negative for fever and unexpected weight change.  HENT: Positive for congestion, postnasal drip and sinus pressure. Negative for dental problem, ear pain, nosebleeds, rhinorrhea, sneezing, sore throat and trouble swallowing.   Eyes: Negative.  Negative for redness and itching.  Respiratory: Positive for shortness of breath. Negative for cough, chest tightness and wheezing.   Cardiovascular: Negative for palpitations and leg swelling.  Gastrointestinal: Negative for nausea and vomiting.  Endocrine: Negative.   Genitourinary: Negative.  Negative for dysuria.  Musculoskeletal: Positive for arthralgias and myalgias. Negative for joint swelling.  Skin: Negative.  Negative for rash.  Allergic/Immunologic: Negative.   Neurological: Positive for light-headedness. Negative for headaches.  Hematological: Bruises/bleeds easily.  Psychiatric/Behavioral: Negative.  Negative for dysphoric mood. The patient is not nervous/anxious.         Objective:   Physical Exam   Vitals:  Vitals:   05/30/16 1507  BP: 126/82  Pulse: 91  SpO2: 97%  Weight: 132 lb 6.4 oz (60.1 kg)  Height: 5\' 9"  (1.753 m)   Constitutional/General:  Pleasant, well-nourished, well-developed, not in any distress,  Comfortably seating.  Well kempt chronically ill. With his 2 L o2.   Body mass index is 19.55 kg/m. Wt Readings from Last 3 Encounters:  05/30/16 132 lb 6.4 oz (60.1 kg)  04/17/16 135 lb 6.4 oz (61.4 kg)  04/05/16 134 lb (60.8 kg)    Neck circumference: 15 in  HEENT: Pupils equal and reactive to light and accommodation. Anicteric sclerae. Normal nasal mucosa.   No oral  lesions,  mouth clear,  oropharynx clear, no postnasal drip. (-) Oral thrush. No dental caries.  Airway - Mallampati class III  Neck: No masses. Midline trachea. No JVD, (-) LAD. (-) bruits appreciated.  Respiratory/Chest: Grossly normal chest. (-) deformity. (-) Accessory muscle use.  Symmetric  expansion. (-) Tenderness on palpation.  Resonant on percussion.  Diminished BS on both lung fields. Clear to auscultation. (-) wheezing,  Rhonchi. Some crackles at bases.  (-) egophony  Cardiovascular: Regular rate and  rhythm, heart sounds normal, no murmur or gallops, trace  peripheral edema  Gastrointestinal:  Normal bowel sounds. Soft, non-tender. No hepatosplenomegaly.  (-) masses.   Musculoskeletal:  Normal muscle tone. Normal gait.   Extremities: Grossly normal. (-) clubbing, cyanosis.  trace edema  Skin: (-) rash,lesions seen.   Neurological/Psychiatric : alert, oriented to time, place, person. Normal mood and affect           Assessment & Plan:  Chronic respiratory failure with hypoxia and hypercapnia (HCC) Patient has severe/very severe emphysema. Last PFT with an FEV1 of 36%. Diffusion capacity of 48%. (05/04/2005) Patient currently is on oxygen 2 L 24 7. In 2017, patient's COPD has been very difficult to control with frequent exacerbation  requiring frequent office visits, prednisone, antibiotics. He is also now on oxygen 2 L 24/7. Prior to this, he was just on oxygen 2 L with exertion.  We started NIV Astral on this patient back in June 2017. Since starting it, he has had issues tolerating it because the pressures too much. He usually wakes up early morning with too much pressure, unable to breathe, suffocating, uncomfortable.   We switched his noninvasive ventilator from astral to Trilogy and he is tolerating that better. Pressures are better tolerated. His mask leak issues are better.  He is sleeping through the night with Trilogy.   He is now tolerating Trilogy well. He sleeps through the night.  We recently tweaked the machine. No issues with it.   Plan : 1. Cont NIV for now with 2L o2. He feels better over all with its use.  Not as many COPD exacerbations since using it. Feels benefit of NIV.  2. ONO on 2 L and NIV >> OK.  3. Currently he is on Symbicort,  160/4.5, 2 puffs twice a day and Spiriva respimat. He uses prn Duoneb.  4. UTD with vaccines (PNA and Flu).     COPD GOLD IV/ 02 dep /steroid dep Patient has severe/very severe emphysema. Last PFT with an FEV1 of 36%. Diffusion capacity of 48%. (05/04/2005) Patient currently is  on oxygen 2 L 24 7.  Cont symbicort 160/4.5 2P BID, spiriva daily, duoneb prn.  Cont O2 2L 24/7. Cont NIV (Trilogy)  Return to clinic to follow up with Dr. [name].        Monica Becton, MD 05/30/2016, 3:36 PM [facility] Pager [phone] After 3 pm or if no answer, call [phone]

## 2016-05-30 NOTE — Assessment & Plan Note (Addendum)
Patient has severe/very severe emphysema. Last PFT with an FEV1 of 36%. Diffusion capacity of 48%. (05/04/2005) Patient currently is  on oxygen 2 L 24 7.  Cont symbicort 160/4.5 2P BID, spiriva daily, duoneb prn.  Cont O2 2L 24/7. Cont NIV (Trilogy)

## 2016-05-30 NOTE — Patient Instructions (Signed)
It was a pleasure taking care of you today!  You are diagnosed with Chronic Obstructive Pulmonary Disease or COPD and chronic hypoxemic respiratory failure.  Continue using your trilogy with 2 L O2.    Return to clinic to see Dr. [name] as scheduled

## 2016-06-05 ENCOUNTER — Other Ambulatory Visit: Payer: Self-pay | Admitting: Internal Medicine

## 2016-06-07 DIAGNOSIS — J342 Deviated nasal septum: Secondary | ICD-10-CM | POA: Diagnosis not present

## 2016-06-07 DIAGNOSIS — J310 Chronic rhinitis: Secondary | ICD-10-CM | POA: Diagnosis not present

## 2016-06-07 DIAGNOSIS — J343 Hypertrophy of nasal turbinates: Secondary | ICD-10-CM | POA: Diagnosis not present

## 2016-06-13 ENCOUNTER — Ambulatory Visit (INDEPENDENT_AMBULATORY_CARE_PROVIDER_SITE_OTHER): Payer: Medicare Other | Admitting: Internal Medicine

## 2016-06-13 ENCOUNTER — Encounter: Payer: Self-pay | Admitting: Internal Medicine

## 2016-06-13 VITALS — BP 158/90 | HR 95 | Ht 69.0 in | Wt 132.0 lb

## 2016-06-13 DIAGNOSIS — I25709 Atherosclerosis of coronary artery bypass graft(s), unspecified, with unspecified angina pectoris: Secondary | ICD-10-CM

## 2016-06-13 DIAGNOSIS — J9612 Chronic respiratory failure with hypercapnia: Secondary | ICD-10-CM

## 2016-06-13 DIAGNOSIS — J449 Chronic obstructive pulmonary disease, unspecified: Secondary | ICD-10-CM | POA: Diagnosis not present

## 2016-06-13 DIAGNOSIS — J310 Chronic rhinitis: Secondary | ICD-10-CM

## 2016-06-13 DIAGNOSIS — J9611 Chronic respiratory failure with hypoxia: Secondary | ICD-10-CM

## 2016-06-13 NOTE — Patient Instructions (Signed)
As per instructions > prednisone is 20 mg daily until better then taper down to 5 mg daily   Change the afrin to where you take it 2 min before the nasonex but use it twice daily   See calendar for specific medication instructions and bring it back for each and every office visit for every healthcare provider you see.  Without it,  you may not receive the best quality medical care that we feel you deserve.  You will note that the calendar groups together  your maintenance  medications that are timed at particular times of the day.  Think of this as your checklist for what your doctor has instructed you to do until your next evaluation to see what benefit  there is  to staying on a consistent group of medications intended to keep you well.  The other group at the bottom is entirely up to you to use as you see fit  for specific symptoms that may arise between visits that require you to treat them on an as needed basis.  Think of this as your action plan or "what if" list.   Separating the top medications from the bottom group is fundamental to providing you adequate care going forward.     Please schedule a follow up visit in 3 months but call sooner if needed

## 2016-06-13 NOTE — Progress Notes (Signed)
Subjective:    Patient ID: [name], male    DOB: 11/27/1929   MRN: [id number]     Summary: Pulmonary/ f/u ov try astepro for pnds   Primary Provider/Referring Provider: [name]    Brief patient profile:  56  yowm last smoked 1983 with GOLD III COPD with some reversibility and 02 dep at hs and with activity    History of Present Illness   10/19/2014  f/u ov/Jihan Rudy re: GOLD III / maint rx symbicort/ spiriva Chief Complaint  Patient presents with  . Follow-up    Pt c/o fatigue and increased chest congestion for the past several wks. He has am cough with white sputum. He is using xopenex 2 x per day on average.    on 10/8 started pred 20 and zpak as per med calendar action plan  gxt flat x 19mph x 15 min and zero grade on 2lpm with sats mid 90s at home  >decrease pred 5mg    11/19/2014 NP Follow up : COPD GOLD III  Returns for follow up .  Says he had a flare 4 days ago with increased SOB, occasional dry cough, chest tightness. Sinus pressure and drainage. Denies fever, wheezing, nausea or vomiting. Started on Zpack and prednisone 20mg  yesterday .  Feels better   rec Follow med calendar closely and bring to each visit.  Finish Zpack as directed.  Taper prednisone and hold at 5mg  daily  Please contact office for sooner follow up if symptoms do not improve or worsen or seek emergency care        04/15/2015  f/u ov/Verley Pariseau re: GOLD III/ hypercarbic/ maint rx symb and spiriva and pred floor of 10 mg and 02 lpm 24/7  Chief Complaint  Patient presents with  . Follow-up    Pt c/o increased nasal congestion and SOB x 2 wks. He is coughing more and producing minmal clear to white sputum.  He is using xopenex inhaler 2-3 x per day.    Wakes up feeling really lousy, mild ha and sluggish despite neg osa eval in past Not using flutter and quite congested in am as well  Doe = MMRC3 = can't walk 100 yards even at a slow pace at a flat grade s stopping due to sob  Even on 02/ not using  treadmill any more  >>cont current regimen     05/21/15 Eval by Dr Dedio/ rec trial of NIV   06/30/2015  f/u ov/Brihany Butch re: GOLD IV copd/ 02 dep hypercarbic/hypoxemic on symb 160/spiriva Chief Complaint  Patient presents with  . Acute Visit    Pt c/o increased DOE for the past month. He is also c/o unintended wt loss over the past 3-4 months.    most of his problem is fatigue, not limiting sob, and has yet to be able to use NIV for more than a few min to maybe an hour/ very inactive rec Goal with the 02 is keep sats over 90%  When breathing worse go ahead and double prednisone dose as per the med calendar      Admit date: 09/24/2015 Discharge date: 09/26/2015 Primary Cardiologist:Dr. Tamala Julian   Discharge Diagnoses:  Principal Problem:   Angina at rest [facility]) Active Problems:   Essential hypertension   COPD GOLD IV/ 02 dep /steroid dep   Chronic respiratory failure with hypoxia and hypercapnia (HCC)   CAD (coronary artery disease)   Chest pain   Orthostatic hypotension not corrected on prednisone 20 mg daily    10/04/2015  Post hosp f/u ov/Farhan Jean re:  Prednisone 20 mg daily  Plus symb 160 2bid/ spiriva respimat / flutter valve/ has still not started astral noct vent / 02 2lpm 24/7  Chief Complaint  Patient presents with  . Follow-up    Pt. wants to discuss being on Tiotropium and Pulmicort, breathing has not been better,been in the hospital twice, some coughing in the morning   main complaint is feeling lousy when stands up, not sob/ while  maintaining per cards  on pred 20 mg daily while still on multiple bp meds for which he is getting confused in terms of how to use.  No improvement in orthostasis on 20 mg per day (looks like also tried florinef prior to the being maint on higher "floor" prednisone dose.  Was waking up feeling flushed with high BP but apparently never happened on NIV as never used it "too much else goingtlined above  rec Continue prednisone 20mg  daily until breathing  better then 10 mg daily as per your med calendar  Put the new blood pressure medicines in pencil on your med calendar Go ahead and use the Astral as much as you can until see Dr Murlean Iba        04/17/2016  f/u ov/Kassidy Frankson re:   COPD IV/ 02 dep /  maint  on symb/spiriva  Chief Complaint  Patient presents with  . Follow-up    COPD, more mucus production in his nasal passenges, hard to get air through the cannula, constantly out of breathe, he cant tell if the nebulizer medications ae helping him   still exercising daily x 10 min s stopping at 32mph flat but can't walk at [place] "I guess I walk too fast" sats are tu[oca;;u pl pm 2 lpm  Lots of nasal congestion, no using afrin or increase nasonex per action plan  rec Use afrin/ flonase to control rhinitis    06/13/2016  f/u ov/Jamonte Curfman re:   COPD GOLD IV/ 02 dep/ maint symb/ spiriva/ 02 2lpm and NIV / trelegy per [facility] Chief Complaint  Patient presents with  . Follow-up    Pt c/o feeling groggy and tired all of the time. He states having alot of nasal and sinus congestion.    walking on 2lpm on treadmill continuous flow x up to 10  min x 1 mph x no grade  Using afrin x 3 days chased but only using hs nasonex No correlation with daytime grogginess and time of day or whether uses NIV or not/ no am ha either way   No obvious day to day or daytime variability or assoc excess/ purulent sputum or mucus plugs or hemoptysis or cp or chest tightness, subjective wheeze or overt sinus or hb symptoms. No unusual exp hx or h/o childhood pna/ asthma or knowledge of premature birth.  Sleeping ok on or off NIV without nocturnal  or early am exacerbation  of respiratory  c/o's or need for noct saba. Also denies any obvious fluctuation of symptoms with weather or environmental changes or other aggravating or alleviating factors except as outlined above   Current Medications, Allergies, Complete Past Medical History, Past Surgical History, Family History, and Social  History were reviewed in [employer] record.  ROS  The following are not active complaints unless bolded sore throat, dysphagia, dental problems, itching, sneezing,  nasal congestion or excess/ purulent secretions, ear ache,   fever, chills, sweats, unintended wt loss, classically pleuritic or exertional cp,  orthopnea pnd or leg swelling, presyncope,  palpitations, abdominal pain, anorexia, nausea, vomiting, diarrhea  or change in bowel or bladder habits, change in stools or urine, dysuria,hematuria,  rash, arthralgias, visual complaints, headache, numbness, weakness or ataxia or problems with walking or coordination,  change in mood/affect or memory.                         Past Medical History:  CHRONIC RHINITIS (ICD-472.0)  - Sinus ct March 03, 2010 > thickening only, no acute changes  CHRONIC RESPIRATORY FAILURE (ICD-518.83)  - 02 rx since 2008  COPD UNSPECIFIED (ICD-496)....................................................................[name]  - PFTs 05/04/05 FEV1 36% ratio 28% with 29% improvement after bronchodilators DLCO 48%  - PFT's 03/26/08 30% ratio 34 with 14% improvement after bronchodilators DLC0 38 %  - Start noct 02 at 2lpm 2008 and on 03/04/09 desat @ > 185 ft so rec wear with activtiy > rm to rm  - [facility] June 30, 2008 100% > confirmed March 04, 2009  ISCHEMIC HEART DISEASE (ICD-414.9)  HYPERTENSION (ICD-401.9)  HYPERLIPIDEMIA (ICD-272.4)  SPN RUL by cxr August 19, 2009  COMPLEX MED REGIMEN-Meds reviewed with pt education and computerized med calendar 11/22/09 03/10/2011  , 10/15/2012  07/17/2013 , 12/09/2013 , 06/15/2014 , 11/19/14 , 5.11.17          Objective:   Physical Exam  amb wm nad - frail and both mildly  anxious and helpless/hopeless / vital signs reviewed   - - Note on arrival 02 sats  98% on 2.5 lpm pulsed         wt 159 04/28/2010  > 10/14/2010 154  > 10/04/2011 150> 12/21/2011  150 > 149 01/29/2012 >154 04/05/2012 > 05/22/2012 148 >  09/30/2012  146 >>144> 10/15/2012 > 12/18/2012  145 > 148 02/14/2013 >148 03/14/2013 > 04/25/2013 148 > 06/19/2013 150 >>07/17/2013 > 08/27/2013 148 > 10/23/2013  148 >153 12/09/2013 > 02/26/2014  153 > 06/01/2014  152 > 157 06/15/2014 > 09/21/2014 146 > 10/19/2014 151 >>148 11/19/14 > 02/24/2015 148 > 06/30/2015    140 >  10/04/2015  136 > 12/20/2015   142 > 06/13/2016  132    HEENT: nl dentition,   and oropharynx. Nl external ear canals without cough reflex - moderate bilateral non-specific turbinate edema     NECK :  without JVD/Nodes/TM/ nl carotid upstrokes bilaterally   LUNGS: no acc muscle use,  Barrel contour chest with  distant bs/ hyperresonant to percussion bilaterally   CV:  RRR  no s3 or murmur or increase in P2, and trace pitting ankle edema bilaterally   ABD:  soft and nontender with nl inspiratory excursion in the supine position. No bruits or organomegaly appreciated, bowel sounds nl  MS:  Nl gait/ ext warm without deformities, calf tenderness, cyanosis or clubbing No obvious joint restrictions   SKIN: warm and dry with bright red ring around R eye where his mask has been rubbing    NEURO:  Alert,  nl sensorium with  no motor or cerebellar deficits apparent.

## 2016-06-14 NOTE — Assessment & Plan Note (Signed)
-   PFTs 05/04/05 FEV1 36% ratio 28% with 29% improvement after bronchodilators DLCO 48%  - PFT's 03/26/08 30% ratio 34 with 14% improvement after bronchodilators DLC0 38 %  - Referred to rehab 12/19/2012 > completed March 2015  - changed to spiriva respimat 02/14/2013  - started daily prednisone 06/19/13 > improved only a little> tapered off mid July 2015 > ok to change to prn prednisone 08/27/2013  - flared off nasonex and gerd rx 09/21/2014 > resumed - 10/19/2014  p extensive coaching HFA effectiveness =    90%  - changed pred to ceiling of 20 / floor of 5 mg daily as of 10/19/2014 > changed to floor of 10 mg daily 10/04/2015  - PFT's  06/08/2015  FEV1 0.58 (22 % ) ratio 27  p No sign % improvement from saba p symb/spiriva prior to study with DLCO  9/9 % corrects to 27 % for alv volume    He has very severe dz and I'm not convinced the NIV has really helped him and fine with me to try several weeks off vs on.  Though somewhat paradoxic, when the lung fails to clear C02 properly and pC02 rises the lung then becomes a more efficient scavenger of C02 allowing lower work of breathing and  better C02 clearance albeit at a higher serum pC02 level - this is why pts can look a lot better than their ABG's would suggest and why it's so difficult to prognosticate endstage dz.  It's also why I strongly rec DNI status (ventilating pts down to a nl pC02 adversely affects this compensatory mechanism)   Otherwise nothing to add to rx other than to walk him thru approp use of action plan including increasing the pred to 20 mg when worse cough/ wheeeze/sob or need for saba over baseline  I had an extended discussion with the patient reviewing all relevant studies completed to date and  lasting 15 to 20 minutes of a 25 minute visit    Each maintenance medication was reviewed in detail including most importantly the difference between maintenance and prns and under what circumstances the prns are to be triggered using an  action plan format that is not reflected in the computer generated alphabetically organized AVS but trather by a customized med calendar that reflects the AVS meds with confirmed 100% correlation.   In addition, Please see AVS for unique instructions that I personally wrote and verbalized to the the pt in detail and then reviewed with pt  by my nurse highlighting any  changes in therapy recommended at today's visit to their plan of care.

## 2016-06-14 NOTE — Assessment & Plan Note (Signed)
Sinus CT 03/03/10 chronic changes only  - rec rechallenge with qnasal 12/21/11  - Trial of 1st gen h1 and hs h2 rec 05/23/2012  - Allergy profile 12/03/2012-negative-IgE 94.8     Food IgE profile-negative - rec rechallnge with 1st gen H1 06/01/14 > could not tolerate either 1st gen H1   nor Zyrtec so recommended go back to Claritin prn 10/19/2014   - reviewed approp use of nasal steroids/ afrin 06/13/2016

## 2016-06-14 NOTE — Assessment & Plan Note (Signed)
-   Start noct 02 at 2lpm 2008 and on 03/04/09 desat @ > 185 ft so rec wear with activtiy > rm to rm  - 02/15/2011   Walked RA x one lap @ 185 stopped due to  desat > corrected on 2lpm - 02/26/2014  Walked 2lpm  2 laps @ 185 ft each stopped due to  Sob/ sats 88% at nl pace  - 10/19/2014   Walked RA  2 laps @ 185 ft each stopped due to  End of study, slow pace,  sob  But no  desat   - HCO3  10/27/15 = 34   rx as of 06/13/2016  = 2lpm 24/7  / NIV trial off for up to 2 weeks to see what difference it makes

## 2016-06-19 DIAGNOSIS — Z681 Body mass index (BMI) 19 or less, adult: Secondary | ICD-10-CM | POA: Diagnosis not present

## 2016-06-19 DIAGNOSIS — R5383 Other fatigue: Secondary | ICD-10-CM | POA: Diagnosis not present

## 2016-06-19 DIAGNOSIS — R5381 Other malaise: Secondary | ICD-10-CM | POA: Diagnosis not present

## 2016-06-21 ENCOUNTER — Encounter: Payer: Self-pay | Admitting: Family

## 2016-06-22 ENCOUNTER — Other Ambulatory Visit: Payer: Self-pay | Admitting: Internal Medicine

## 2016-06-28 ENCOUNTER — Encounter (HOSPITAL_COMMUNITY): Payer: Medicare Other

## 2016-06-28 ENCOUNTER — Ambulatory Visit: Payer: Medicare Other | Admitting: Family

## 2016-07-01 ENCOUNTER — Other Ambulatory Visit: Payer: Self-pay | Admitting: Endocrinology

## 2016-07-01 DIAGNOSIS — I951 Orthostatic hypotension: Secondary | ICD-10-CM

## 2016-07-03 ENCOUNTER — Ambulatory Visit (INDEPENDENT_AMBULATORY_CARE_PROVIDER_SITE_OTHER)
Admission: RE | Admit: 2016-07-03 | Discharge: 2016-07-03 | Disposition: A | Discharge status: Home / Self Care | Payer: Medicare Other | Source: Ambulatory Visit | Attending: Family | Admitting: Family

## 2016-07-03 ENCOUNTER — Encounter (HOSPITAL_COMMUNITY): Payer: Medicare Other

## 2016-07-03 ENCOUNTER — Ambulatory Visit (HOSPITAL_COMMUNITY)
Admission: RE | Admit: 2016-07-03 | Discharge: 2016-07-03 | Disposition: A | Discharge status: Home / Self Care | Payer: Medicare Other | Source: Ambulatory Visit | Attending: Family | Admitting: Family

## 2016-07-03 ENCOUNTER — Ambulatory Visit: Payer: Medicare Other | Admitting: Family

## 2016-07-03 ENCOUNTER — Encounter: Payer: Self-pay | Admitting: Family

## 2016-07-03 ENCOUNTER — Ambulatory Visit (INDEPENDENT_AMBULATORY_CARE_PROVIDER_SITE_OTHER): Payer: Medicare Other | Admitting: Family

## 2016-07-03 ENCOUNTER — Ambulatory Visit: Payer: Medicare Other | Admitting: Internal Medicine

## 2016-07-03 VITALS — BP 169/79 | HR 72 | Temp 96.3°F | Resp 20 | Ht 69.0 in | Wt 136.0 lb

## 2016-07-03 DIAGNOSIS — I779 Disorder of arteries and arterioles, unspecified: Secondary | ICD-10-CM

## 2016-07-03 DIAGNOSIS — R0989 Other specified symptoms and signs involving the circulatory and respiratory systems: Secondary | ICD-10-CM | POA: Diagnosis present

## 2016-07-03 DIAGNOSIS — R938 Abnormal findings on diagnostic imaging of other specified body structures: Secondary | ICD-10-CM | POA: Insufficient documentation

## 2016-07-03 DIAGNOSIS — Z87891 Personal history of nicotine dependence: Secondary | ICD-10-CM | POA: Diagnosis not present

## 2016-07-03 DIAGNOSIS — I6523 Occlusion and stenosis of bilateral carotid arteries: Secondary | ICD-10-CM

## 2016-07-03 DIAGNOSIS — I739 Peripheral vascular disease, unspecified: Secondary | ICD-10-CM | POA: Insufficient documentation

## 2016-07-03 DIAGNOSIS — E785 Hyperlipidemia, unspecified: Secondary | ICD-10-CM | POA: Insufficient documentation

## 2016-07-03 DIAGNOSIS — R1084 Generalized abdominal pain: Secondary | ICD-10-CM | POA: Diagnosis not present

## 2016-07-03 NOTE — Patient Instructions (Signed)
Stroke Prevention Some medical conditions and behaviors are associated with an increased chance of having a stroke. You may prevent a stroke by making healthy choices and managing medical conditions. How can I reduce my risk of having a stroke?  Stay physically active. Get at least 30 minutes of activity on most or all days.  Do not smoke. It may also be helpful to avoid exposure to secondhand smoke.  Limit alcohol use. Moderate alcohol use is considered to be:  No more than 2 drinks per day for men.  No more than 1 drink per day for nonpregnant women.  Eat healthy foods. This involves:  Eating 5 or more servings of fruits and vegetables a day.  Making dietary changes that address high blood pressure (hypertension), high cholesterol, diabetes, or obesity.  Manage your cholesterol levels.  Making food choices that are high in fiber and low in saturated fat, trans fat, and cholesterol may control cholesterol levels.  Take any prescribed medicines to control cholesterol as directed by your health care provider.  Manage your diabetes.  Controlling your carbohydrate and sugar intake is recommended to manage diabetes.  Take any prescribed medicines to control diabetes as directed by your health care provider.  Control your hypertension.  Making food choices that are low in salt (sodium), saturated fat, trans fat, and cholesterol is recommended to manage hypertension.  Ask your health care provider if you need treatment to lower your blood pressure. Take any prescribed medicines to control hypertension as directed by your health care provider.  If you are 18-39 years of age, have your blood pressure checked every 3-5 years. If you are 40 years of age or older, have your blood pressure checked every year.  Maintain a healthy weight.  Reducing calorie intake and making food choices that are low in sodium, saturated fat, trans fat, and cholesterol are recommended to manage  weight.  Stop drug abuse.  Avoid taking birth control pills.  Talk to your health care provider about the risks of taking birth control pills if you are over 35 years old, smoke, get migraines, or have ever had a blood clot.  Get evaluated for sleep disorders (sleep apnea).  Talk to your health care provider about getting a sleep evaluation if you snore a lot or have excessive sleepiness.  Take medicines only as directed by your health care provider.  For some people, aspirin or blood thinners (anticoagulants) are helpful in reducing the risk of forming abnormal blood clots that can lead to stroke. If you have the irregular heart rhythm of atrial fibrillation, you should be on a blood thinner unless there is a good reason you cannot take them.  Understand all your medicine instructions.  Make sure that other conditions (such as anemia or atherosclerosis) are addressed. Get help right away if:  You have sudden weakness or numbness of the face, arm, or leg, especially on one side of the body.  Your face or eyelid droops to one side.  You have sudden confusion.  You have trouble speaking (aphasia) or understanding.  You have sudden trouble seeing in one or both eyes.  You have sudden trouble walking.  You have dizziness.  You have a loss of balance or coordination.  You have a sudden, severe headache with no known cause.  You have new chest pain or an irregular heartbeat. Any of these symptoms may represent a serious problem that is an emergency. Do not wait to see if the symptoms will go away.   Get medical help at once. Call your local emergency services (911 in [place] Do not drive yourself to the hospital.  This information is not intended to replace advice given to you by your health care provider. Make sure you discuss any questions you have with your health care provider. Document Released: 02/03/2004 Document Revised: 06/03/2015 Document Reviewed: 06/28/2012 Elsevier  Interactive Patient Education  2017 [employer] Inc.      Peripheral Vascular Disease Peripheral vascular disease (PVD) is a disease of the blood vessels that are not part of your heart and brain. A simple term for PVD is poor circulation. In most cases, PVD narrows the blood vessels that carry blood from your heart to the rest of your body. This can result in a decreased supply of blood to your arms, legs, and internal organs, like your stomach or kidneys. However, it most often affects a person's lower legs and feet. There are two types of PVD.  Organic PVD. This is the more common type. It is caused by damage to the structure of blood vessels.  Functional PVD. This is caused by conditions that make blood vessels contract and tighten (spasm). Without treatment, PVD tends to get worse over time. PVD can also lead to acute ischemic limb. This is when an arm or limb suddenly has trouble getting enough blood. This is a medical emergency. Follow these instructions at home:  Take medicines only as told by your doctor.  Do not use any tobacco products, including cigarettes, chewing tobacco, or electronic cigarettes. If you need help quitting, ask your doctor.  Lose weight if you are overweight, and maintain a healthy weight as told by your doctor.  Eat a diet that is low in fat and cholesterol. If you need help, ask your doctor.  Exercise regularly. Ask your doctor for some good activities for you.  Take good care of your feet.  Wear comfortable shoes that fit well.  Check your feet often for any cuts or sores. Contact a doctor if:  You have cramps in your legs while walking.  You have leg pain when you are at rest.  You have coldness in a leg or foot.  Your skin changes.  You are unable to get or have an erection (erectile dysfunction).  You have cuts or sores on your feet that are not healing. Get help right away if:  Your arm or leg turns cold and blue.  Your arms or legs  become red, warm, swollen, painful, or numb.  You have chest pain or trouble breathing.  You suddenly have weakness in your face, arm, or leg.  You become very confused or you cannot speak.  You suddenly have a very bad headache.  You suddenly cannot see. This information is not intended to replace advice given to you by your health care provider. Make sure you discuss any questions you have with your health care provider. Document Released: 03/22/2009 Document Revised: 06/03/2015 Document Reviewed: 06/05/2013 Elsevier Interactive Patient Education  2017 [employer] Inc.  

## 2016-07-03 NOTE — Progress Notes (Signed)
[facility] [place] HISTORY AND PHYSICAL   MRN : [id number]  History of Present Illness:   [name] is a 83 y.o. male patient of Dr. Scot Dock seen for follow up of known extracranial carotid artery stenosis and PAOD.  He was initially referred by his cardiologist for monitoring of his ICA's. Pt had a 2003 CABG x 4 vessels. He uses 2 liters portable O2 when walking and sleeping, has severe COPD. Pt was evaluated by a gastroenterologist re RUQ intermittent pain, Prilosec has helped.   He has not had previous carotid artery intervention.  He denies any history of TIA or stroke symptom. Specifically he denies a history of amaurosis fugax or monocular blindness, unilateral facial drooping, hemiplegia, or receptive or expressive aphasia.   Arms feel weak, burning, and tingling; this is ongoing. He does not know of any c-spine issues.  He feels tired and weak, but he reports that his pulmonologist and cardiologist told him that his cardiac and lung status are stable.  He also adds that he has decreased his alprazolam use, and will then start Zoloft.   He walks most days of the week on his treadmill for 15 minutes, is limited by dyspnea. He has seen a neurologist for dizziness, states he was diagnosed with paroxysmal vertigo. He has ankle swelling later in the day, left worse than right, no ankle swelling in the morning, he has knee high compression hose, advised to donn before he gets OOB.  He reports a hx of his blood pressure being labile since September 7681: from 75 systolic to 157; he states he was evaluated for 3 days in hospital, consulted by endocrinologist. He was placed on prednisone, just weaned off.  His renal arteries were evaluated by Dr. Rockey Situ in September 2017, renal duplex result on file shows normal bilateral renal arteries.   Weight on 05/14/13 was 147 lb, 138 lb on 12-29-15, 136 #today. He reports occasional post prandial abdominal pain.    Pt Diabetic: No Pt smoker: former smoker, quit in 1983  Pt meds include: Statin : Yes ASA: Yes Other anticoagulants/antiplatelets: no    Current Outpatient Prescriptions  Medication Sig Dispense Refill  . ALPRAZolam (XANAX) 0.25 MG tablet Take 0.25 mg by mouth 2 (two) times daily as needed for anxiety. Takes 1/2 tablet two times daily.    Marland Kitchen aspirin 81 MG tablet Take 81 mg by mouth daily.    Marland Kitchen atorvastatin (LIPITOR) 20 MG tablet TAKE 1 TABLET BY MOUTH EVERY DAY 90 tablet 2  . azithromycin (ZITHROMAX) 250 MG tablet Take as directed for flare up    . b complex vitamins tablet Take 1 tablet by mouth daily.    . Cholecalciferol (VITAMIN D3) 2000 units TABS Take 2,000 Units by mouth daily.    . finasteride (PROSCAR) 5 MG tablet Take 5 mg by mouth every morning.    . fludrocortisone (FLORINEF) 0.1 MG tablet Take 0.1 mg by mouth daily as needed (BP < 100 STANDING).    . furosemide (LASIX) 20 MG tablet Take 20 mg by mouth daily as needed.    Marland Kitchen guaiFENesin (MUCINEX) 600 MG 12 hr tablet Take by mouth 2 (two) times daily as needed.    . hydrALAZINE (APRESOLINE) 25 MG tablet Take 1 tablet (25 mg total) by mouth every 12 (twelve) hours as needed (for BP >262 systolic or > 035 diastolic). If your BP is 597 systolic you may take one dose of the hydralazine that you have at home.  But would not do this more than twice a day.    . levalbuterol (XOPENEX) 0.63 MG/3ML nebulizer solution Take 3 mLs (0.63 mg total) by nebulization every 4 (four) hours as needed for wheezing or shortness of breath. 300 mL 12  . loratadine (CLARITIN) 10 MG tablet Take 10 mg by mouth daily as needed for allergies.    . mometasone (NASONEX) 50 MCG/ACT nasal spray Place 2 sprays into the nose daily.    . mometasone (NASONEX) 50 MCG/ACT nasal spray SHAKE LIQUID AND USE 2 SPRAYS IN EACH NOSTRIL DAILY 17 g 5  . Multiple Vitamin (MULTIVITAMIN) tablet Take 1 tablet by mouth daily.    Marland Kitchen NITROSTAT 0.4 MG SL tablet DISSOLVE 1 TABLET  UNDER THE TONGUE AS DIRECTED AND AS NEEDED FOR CHEST PAIN 25 tablet 5  . OXYGEN Take 2 L by mouth daily as needed. 2 l/m at bedtime and as needed for shortness of breath    . predniSONE (DELTASONE) 5 MG tablet Take 2.5 mg by mouth 2 (two) times daily.  3  . SPIRIVA RESPIMAT 2.5 MCG/ACT AERS INHALE 2 PUFFS BY MOUTH EVERY DAY 1 Inhaler 11  . SYMBICORT 160-4.5 MCG/ACT inhaler INHALE 2 PUFFS BY MOUTH EVERY MORNING AND THEN ANOTHER 2 PUFFS ABOUT 12 HOURS LATER 10.2 g 11  . TOPROL XL 25 MG 24 hr tablet TAKE 1 TABLET BY MOUTH DAILY 30 tablet 11  . XOPENEX HFA 45 MCG/ACT inhaler INHALE 2 PUFFS BY MOUTH EVERY 4 HOURS AS NEEDED FOR WHEEZING OR SHORTNESS OF BREATH 15 g 2   No current facility-administered medications for this visit.     Past Medical History:  Diagnosis Date  . Asthma   . BPH (benign prostatic hyperplasia)    severe; s/p multiple biopsies  . CAD (coronary artery disease)   . Carotid artery occlusion   . Chronic rhinitis   . COPD (chronic obstructive pulmonary disease) (HCC)    2L [place] O2  . Hyperlipidemia   . Hypertension   . Peripheral vascular disease ([place])   . 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 Syncope and collapse   . Ureteral tumor 08/2015   had endoscopic procedure for evaluation, unable to reach for biopsy    Social History Social History  Substance Use Topics  . Smoking status: Former Smoker    Packs/day: 2.00    Years: 33.00    Types: Cigarettes    Quit date: 01/09/1982  . Smokeless tobacco: Never Used  . Alcohol use 0.0 oz/week     Comment: 1.5-3 oz daily in the past, minimal use in the last few months    Family History Family History  Problem Relation Age of Onset  . Heart disease Mother   . Breast cancer Mother   . Cancer Mother        Breast cancer  . Hypertension Mother   . Other Mother        AAA  and   Amputation  . Heart disease Father        Before age 59  . Heart attack Father   . Stroke Brother        x3, still living   . Peripheral vascular disease  Brother   . Heart disease Brother   . Hyperlipidemia Brother   . Hypertension Brother   . Heart disease Brother   . Allergies Brother     Surgical History Past Surgical History:  Procedure Laterality Date  . CATARACT EXTRACTION  11-20-11  . CORONARY  ARTERY BYPASS GRAFT  2003  . LITHOTRIPSY    . PROSTATE BIOPSY  Oct. 2014    Allergies  Allergen Reactions  . Sulfonamide Derivatives Swelling    REACTION: facial/tongue swelling  . Cefdinir Diarrhea    REACTION: diarrhea  . Ciprofloxacin Itching and Rash    Red itchy hands  . Nitrofurantoin Swelling    Swollen hands  . Levaquin [Levofloxacin] Itching and Rash    Current Outpatient Prescriptions  Medication Sig Dispense Refill  . ALPRAZolam (XANAX) 0.25 MG tablet Take 0.25 mg by mouth 2 (two) times daily as needed for anxiety. Takes 1/2 tablet two times daily.    Marland Kitchen aspirin 81 MG tablet Take 81 mg by mouth daily.    Marland Kitchen atorvastatin (LIPITOR) 20 MG tablet TAKE 1 TABLET BY MOUTH EVERY DAY 90 tablet 2  . azithromycin (ZITHROMAX) 250 MG tablet Take as directed for flare up    . b complex vitamins tablet Take 1 tablet by mouth daily.    . Cholecalciferol (VITAMIN D3) 2000 units TABS Take 2,000 Units by mouth daily.    . finasteride (PROSCAR) 5 MG tablet Take 5 mg by mouth every morning.    . fludrocortisone (FLORINEF) 0.1 MG tablet Take 0.1 mg by mouth daily as needed (BP < 100 STANDING).    . furosemide (LASIX) 20 MG tablet Take 20 mg by mouth daily as needed.    Marland Kitchen guaiFENesin (MUCINEX) 600 MG 12 hr tablet Take by mouth 2 (two) times daily as needed.    . hydrALAZINE (APRESOLINE) 25 MG tablet Take 1 tablet (25 mg total) by mouth every 12 (twelve) hours as needed (for BP >761 systolic or > 607 diastolic). If your BP is 371 systolic you may take one dose of the hydralazine that you have at home.  But would not do this more than twice a day.    . levalbuterol (XOPENEX) 0.63 MG/3ML nebulizer solution Take 3 mLs (0.63 mg total) by  nebulization every 4 (four) hours as needed for wheezing or shortness of breath. 300 mL 12  . loratadine (CLARITIN) 10 MG tablet Take 10 mg by mouth daily as needed for allergies.    . mometasone (NASONEX) 50 MCG/ACT nasal spray Place 2 sprays into the nose daily.    . mometasone (NASONEX) 50 MCG/ACT nasal spray SHAKE LIQUID AND USE 2 SPRAYS IN EACH NOSTRIL DAILY 17 g 5  . Multiple Vitamin (MULTIVITAMIN) tablet Take 1 tablet by mouth daily.    Marland Kitchen NITROSTAT 0.4 MG SL tablet DISSOLVE 1 TABLET UNDER THE TONGUE AS DIRECTED AND AS NEEDED FOR CHEST PAIN 25 tablet 5  . OXYGEN Take 2 L by mouth daily as needed. 2 l/m at bedtime and as needed for shortness of breath    . predniSONE (DELTASONE) 5 MG tablet Take 2.5 mg by mouth 2 (two) times daily.  3  . SPIRIVA RESPIMAT 2.5 MCG/ACT AERS INHALE 2 PUFFS BY MOUTH EVERY DAY 1 Inhaler 11  . SYMBICORT 160-4.5 MCG/ACT inhaler INHALE 2 PUFFS BY MOUTH EVERY MORNING AND THEN ANOTHER 2 PUFFS ABOUT 12 HOURS LATER 10.2 g 11  . TOPROL XL 25 MG 24 hr tablet TAKE 1 TABLET BY MOUTH DAILY 30 tablet 11  . XOPENEX HFA 45 MCG/ACT inhaler INHALE 2 PUFFS BY MOUTH EVERY 4 HOURS AS NEEDED FOR WHEEZING OR SHORTNESS OF BREATH 15 g 2   No current facility-administered medications for this visit.      REVIEW OF SYSTEMS: See HPI for pertinent positives and  negatives.  Physical Examination Vitals:   07/03/16 1119 07/03/16 1124 07/03/16 1125  BP: (!) 171/80 (!) 145/79 (!) 169/79  Pulse: 72    Resp: 20    Temp: (!) 96.3 F (35.7 C)    TempSrc: Oral    SpO2: 99%    Weight: 136 lb (61.7 kg)    Height: 5\' 9"  (1.753 m)     Body mass index is 20.08 kg/m.  General:  WDWN thin male in NAD GAIT:normal Eyes: PERRLA Pulmonary: Mildly-labored at rest, limited air movement in all fields, no rales, no rhonchi, &no wheezing. He is using his portable oxygen dispenser, 2 L/[place]  Cardiac: regular rhythm, no detected murmur.  VASCULAR EXAM  Left Right  Carotid Bruits  Negative Negative  RADIAL PULSE 2+ palpable 2+ palpable  AORTA Not palpable N/A  FEMORAL 1+ palpable 1+ palpable  POPLITEAL Not palpable Not palpable  POSTERIOR TIBIAL Not palpable Not palpable  DORSALIS PEDIS Not palpable Not palpable     Gastrointestinal: soft, nontender, BS WNL, no r/g, no palpable masses.  Musculoskeletal: Age appropriate muscle atrophy/wasting, bu is also thin. M/S 4/5 throughout, Extremities without ischemic changes. 1+pitting edema in right ankle, 2+ in left ankle.Thick long toenails.   Neurologic: A&O X 3; Appropriate Affect, Speech is normal CN 2-12 intact except has some hearing loss, Motor exam as listed above       ASSESSMENT:  [name] is a 83 y.o. male who has no history of stroke or TIA.  He has known peripheral artery occlusive disease. His walking seems limited by his dyspnea and overall lowered functional status. He does not seem to have claudication symptoms per se, he develops generalized weakness after walking 18 minutes on his treadmill. He uses supplemental oxygen via nasal canula continuously. He has no signs of ischemia in his feet/legs.  He has had gradual weight loss over the last couple of years: Weight on 05/14/13 was 147 lb, 138 lb on 12-29-15, 136 #today. He has post prandial abdominal pain; no significant stenosis on mesenteric artery duplex today.  DATA   Carotid Duplex (07/03/16): 40 -59% right internal carotid artery stenosis. Less than 40% left internal carotid artery stenosis.  Bilateral vertebral artery flow is antegrade.  Bilateral subclavian artery waveforms are normal.  No significant change since exams of 06/18/2014 and 06/23/15.    Mesenteric Duplex (07/03/16): Patent celiac and SMA with no increased velocity or plaque visualized; appears WNL.  No previous for comparison.    ABI  (Date: 07/03/2016)  R:   ABI: 0.85 (was [place] on 12-29-15),   PT: mono  DP: mono  TBI:  0.30 (was 0.32)  L:   ABI: 0.63 (was 0.55),   PT: mono  DP: mono  TBI: 0.38 (was 0.43)  Right ABI remains falsely elevated as monophasic waveforms do not correlate with 0.85. Left ABI remains stable with moderate arterial occlusive disease.  All monophasic waveforms.   PLAN:   Referral to podiatrist for regular trimming of toenails: indications are thick long toenails and PAD.  Continue graduated walking program as best he can due to dyspnea limitations.  Based on today's exam and non-invasive vascular lab results, the patient will follow up in 6 months with the following tests: ABI's, carotid duplex.  I discussed in depth with the patient the nature of atherosclerosis, and emphasized the importance of maximal medical management including strict control of blood pressure, blood glucose, and lipid levels, obtaining regular exercise, and cessation of smoking.  The patient  is aware that without maximal medical management the underlying atherosclerotic disease process will progress, limiting the benefit of any interventions.  The patient was given information about stroke prevention and what symptoms should prompt the patient to seek immediate medical care.  The patient was given information about PAD including signs, symptoms, treatment, what symptoms should prompt the patient to seek immediate medical care, and risk reduction measures to take.  Thank you for allowing [place] to participate in this patient's care.  Clemon Chambers, RN, MSN, FNP-C [facility] Office: [phone]  Clinic MD: Trula Slade 07/03/2016 11:26 AM

## 2016-07-04 NOTE — Addendum Note (Signed)
Addended by: Lianne Cure A on: 07/04/2016 02:15 PM   Modules accepted: Orders

## 2016-07-06 ENCOUNTER — Telehealth: Payer: Self-pay | Admitting: Family

## 2016-07-06 ENCOUNTER — Other Ambulatory Visit (INDEPENDENT_AMBULATORY_CARE_PROVIDER_SITE_OTHER): Payer: Medicare Other

## 2016-07-06 DIAGNOSIS — I951 Orthostatic hypotension: Secondary | ICD-10-CM | POA: Diagnosis not present

## 2016-07-06 LAB — COMPREHENSIVE METABOLIC PANEL
ALT: 20 U/L (ref 0–53)
AST: 23 U/L (ref 0–37)
Albumin: 4.2 g/dL (ref 3.5–5.2)
Alkaline Phosphatase: 58 U/L (ref 39–117)
BUN: 13 mg/dL (ref 6–23)
CO2: 32 mEq/L (ref 19–32)
Calcium: 9.5 mg/dL (ref 8.4–10.5)
Chloride: 101 mEq/L (ref 96–112)
Creatinine, Ser: 1.03 mg/dL (ref 0.40–1.50)
GFR: 72.96 mL/min (ref >60.00)
Glucose, Bld: 140 mg/dL — ABNORMAL HIGH (ref 70–99)
Potassium: 4.1 mEq/L (ref 3.5–5.1)
Sodium: 139 mEq/L (ref 135–145)
Total Bilirubin: 0.7 mg/dL (ref 0.2–1.2)
Total Protein: 6.2 g/dL (ref 6.0–8.3)

## 2016-07-06 NOTE — Telephone Encounter (Signed)
Per Ysidro Evert instructions on 07/03/16 I referred this patient to [facility] for trimming of his toenails. I faxed the referral and their office will contact the patient for an appt. [phone] [phone] I also left a message regarding the above info with the pt's wife. awt

## 2016-07-07 DIAGNOSIS — N401 Enlarged prostate with lower urinary tract symptoms: Secondary | ICD-10-CM | POA: Diagnosis not present

## 2016-07-07 DIAGNOSIS — R350 Frequency of micturition: Secondary | ICD-10-CM | POA: Diagnosis not present

## 2016-07-07 DIAGNOSIS — R3912 Poor urinary stream: Secondary | ICD-10-CM | POA: Diagnosis not present

## 2016-07-07 DIAGNOSIS — R3915 Urgency of urination: Secondary | ICD-10-CM | POA: Diagnosis not present

## 2016-07-10 ENCOUNTER — Ambulatory Visit: Payer: Medicare Other | Admitting: Endocrinology

## 2016-07-10 DIAGNOSIS — Z0289 Encounter for other administrative examinations: Secondary | ICD-10-CM

## 2016-07-10 DIAGNOSIS — F324 Major depressive disorder, single episode, in partial remission: Secondary | ICD-10-CM | POA: Diagnosis not present

## 2016-07-10 DIAGNOSIS — M549 Dorsalgia, unspecified: Secondary | ICD-10-CM | POA: Diagnosis not present

## 2016-07-10 DIAGNOSIS — I10 Essential (primary) hypertension: Secondary | ICD-10-CM | POA: Diagnosis not present

## 2016-07-10 DIAGNOSIS — F419 Anxiety disorder, unspecified: Secondary | ICD-10-CM | POA: Diagnosis not present

## 2016-07-13 DIAGNOSIS — E785 Hyperlipidemia, unspecified: Secondary | ICD-10-CM | POA: Diagnosis not present

## 2016-07-13 DIAGNOSIS — I2781 Cor pulmonale (chronic): Secondary | ICD-10-CM | POA: Diagnosis not present

## 2016-07-13 DIAGNOSIS — R0600 Dyspnea, unspecified: Secondary | ICD-10-CM | POA: Diagnosis not present

## 2016-07-13 DIAGNOSIS — I739 Peripheral vascular disease, unspecified: Secondary | ICD-10-CM | POA: Diagnosis not present

## 2016-07-13 DIAGNOSIS — I2510 Atherosclerotic heart disease of native coronary artery without angina pectoris: Secondary | ICD-10-CM | POA: Diagnosis not present

## 2016-07-13 DIAGNOSIS — I872 Venous insufficiency (chronic) (peripheral): Secondary | ICD-10-CM | POA: Diagnosis not present

## 2016-07-13 DIAGNOSIS — I6523 Occlusion and stenosis of bilateral carotid arteries: Secondary | ICD-10-CM | POA: Diagnosis not present

## 2016-07-13 DIAGNOSIS — R0609 Other forms of dyspnea: Secondary | ICD-10-CM | POA: Diagnosis not present

## 2016-07-13 DIAGNOSIS — I951 Orthostatic hypotension: Secondary | ICD-10-CM | POA: Diagnosis not present

## 2016-07-13 DIAGNOSIS — I119 Hypertensive heart disease without heart failure: Secondary | ICD-10-CM | POA: Diagnosis not present

## 2016-07-13 DIAGNOSIS — I4949 Other premature depolarization: Secondary | ICD-10-CM | POA: Diagnosis not present

## 2016-07-13 DIAGNOSIS — J449 Chronic obstructive pulmonary disease, unspecified: Secondary | ICD-10-CM | POA: Diagnosis not present

## 2016-07-19 ENCOUNTER — Telehealth: Payer: Self-pay | Admitting: Internal Medicine

## 2016-07-19 NOTE — Telephone Encounter (Signed)
Spoke with patient. He was seen on 06/13/16 by MW and was advised to continue prednisone 20mg  until he felt better. He states that he is not feeling better. Still has nasal congestion and increased SOB. These symptoms have gotten worse over the past 3 weeks.   He also wanted to know if being on prednisone for a long time would cause him to feel dizzy. He has been dizzy for the past week.    He wishes to use [facility] on [address]   MW, please advise. Thanks!

## 2016-07-19 NOTE — Telephone Encounter (Signed)
Pt aware of rec's per Dr [name].  Expressed understanding.  Nothing further needed.

## 2016-07-19 NOTE — Telephone Encounter (Signed)
Sorry to hear that but nothing else to offer, would reduce the prednisone back to 10 mg daily until seen

## 2016-07-21 ENCOUNTER — Telehealth: Payer: Self-pay | Admitting: Internal Medicine

## 2016-07-21 MED ORDER — AZITHROMYCIN 250 MG PO TABS: ORAL_TABLET | ORAL | 0 refills | Status: DC

## 2016-07-21 NOTE — Telephone Encounter (Signed)
[id number] 

## 2016-07-21 NOTE — Telephone Encounter (Signed)
MW  Please Advise-  Pt called in requesting azithromycin to be called into his pharmacy. Pt states he is experiencing sinus pressure,sinsus congestion,sinus drainage,slight chest tightness,states it is difficult to breath through his nasal cannula due to the congestion, Denies cough.   Pharmacy [facility] [address]   Allergies  Allergen Reactions  . Sulfonamide Derivatives Swelling    REACTION: facial/tongue swelling  . Cefdinir Diarrhea    REACTION: diarrhea  . Ciprofloxacin Itching and Rash    Red itchy hands  . Nitrofurantoin Swelling    Swollen hands  . Levaquin [Levofloxacin] Itching and Rash     Patient Instructions by Tanda Rockers, MD at 06/13/2016 11:45 AM   Author: Tanda Rockers, MD Author Type: Physician Filed: 06/13/2016 12:19 PM  Note Status: Signed Cosign: Cosign Not Required Encounter Date: 06/13/2016  Editor: Tanda Rockers, MD (Physician)    As per instructions > prednisone is 20 mg daily until better then taper down to 5 mg daily   Change the afrin to where you take it 2 min before the nasonex but use it twice daily   See calendar for specific medication instructions and bring it back for each and every office visit for every healthcare provider you see.  Without it,  you may not receive the best quality medical care that we feel you deserve.  You will note that the calendar groups together  your maintenance  medications that are timed at particular times of the day.  Think of this as your checklist for what your doctor has instructed you to do until your next evaluation to see what benefit  there is  to staying on a consistent group of medications intended to keep you well.  The other group at the bottom is entirely up to you to use as you see fit  for specific symptoms that may arise between visits that require you to treat them on an as needed basis.  Think of this as your action plan or "what if" list.   Separating the top medications from the  bottom group is fundamental to providing you adequate care going forward.     Please schedule a follow up visit in 3 months but call sooner if needed

## 2016-07-21 NOTE — Telephone Encounter (Signed)
Spoke with the pt and notified of recs per MW  He verbalized understanding  Rx was sent to pharm   

## 2016-07-24 DIAGNOSIS — R0981 Nasal congestion: Secondary | ICD-10-CM | POA: Diagnosis not present

## 2016-07-24 DIAGNOSIS — R609 Edema, unspecified: Secondary | ICD-10-CM | POA: Diagnosis not present

## 2016-07-24 DIAGNOSIS — F419 Anxiety disorder, unspecified: Secondary | ICD-10-CM | POA: Diagnosis not present

## 2016-07-25 DIAGNOSIS — M5441 Lumbago with sciatica, right side: Secondary | ICD-10-CM | POA: Diagnosis not present

## 2016-07-25 DIAGNOSIS — G8929 Other chronic pain: Secondary | ICD-10-CM | POA: Diagnosis not present

## 2016-07-25 DIAGNOSIS — M542 Cervicalgia: Secondary | ICD-10-CM | POA: Diagnosis not present

## 2016-07-30 ENCOUNTER — Other Ambulatory Visit: Payer: Self-pay | Admitting: Internal Medicine

## 2016-07-31 ENCOUNTER — Telehealth: Payer: Self-pay | Admitting: Internal Medicine

## 2016-07-31 DIAGNOSIS — J449 Chronic obstructive pulmonary disease, unspecified: Secondary | ICD-10-CM

## 2016-07-31 NOTE — Telephone Encounter (Signed)
Left message for patient to call back  

## 2016-08-01 NOTE — Telephone Encounter (Signed)
Fine with me

## 2016-08-01 NOTE — Telephone Encounter (Signed)
Pt is requesting an order for LIQUID O2 to be sent to [facility]- verified fax # listed below.  Pt states that he is traveling out of town and requests specifically liquid O2 for exertion and qhs while he is traveling.    MW ok to order?  Thanks!  06/13/16 AVS:   Patient Instructions   As per instructions > prednisone is 20 mg daily until better then taper down to 5 mg daily    Change the afrin to where you take it 2 min before the nasonex but use it twice daily    See calendar for specific medication instructions and bring it back for each and every office visit for every healthcare provider you see.  Without it,  you may not receive the best quality medical care that we feel you deserve.   You will note that the calendar groups together  your maintenance  medications that are timed at particular times of the day.  Think of this as your checklist for what your doctor has instructed you to do until your next evaluation to see what benefit  there is  to staying on a consistent group of medications intended to keep you well.  The other group at the bottom is entirely up to you to use as you see fit  for specific symptoms that may arise between visits that require you to treat them on an as needed basis.  Think of this as your action plan or "what if" list.    Separating the top medications from the bottom group is fundamental to providing you adequate care going forward.       Please schedule a follow up visit in 3 months but call sooner if needed

## 2016-08-01 NOTE — Telephone Encounter (Signed)
Spoke with pt. He is aware of MW's response. Order has been placed. Nothing further was needed.

## 2016-08-01 NOTE — Telephone Encounter (Signed)
Patient is returning phone call.  [phone]

## 2016-08-01 NOTE — Telephone Encounter (Signed)
lmtcb x2 for pt. 

## 2016-08-06 DIAGNOSIS — R0789 Other chest pain: Secondary | ICD-10-CM | POA: Diagnosis not present

## 2016-08-06 DIAGNOSIS — Z882 Allergy status to sulfonamides status: Secondary | ICD-10-CM | POA: Diagnosis not present

## 2016-08-06 DIAGNOSIS — Z79899 Other long term (current) drug therapy: Secondary | ICD-10-CM | POA: Diagnosis not present

## 2016-08-06 DIAGNOSIS — I10 Essential (primary) hypertension: Secondary | ICD-10-CM | POA: Diagnosis not present

## 2016-08-06 DIAGNOSIS — R2242 Localized swelling, mass and lump, left lower limb: Secondary | ICD-10-CM | POA: Diagnosis not present

## 2016-08-06 DIAGNOSIS — N400 Enlarged prostate without lower urinary tract symptoms: Secondary | ICD-10-CM | POA: Diagnosis not present

## 2016-08-06 DIAGNOSIS — J8410 Pulmonary fibrosis, unspecified: Secondary | ICD-10-CM | POA: Diagnosis not present

## 2016-08-06 DIAGNOSIS — J439 Emphysema, unspecified: Secondary | ICD-10-CM | POA: Diagnosis not present

## 2016-08-06 DIAGNOSIS — E7800 Pure hypercholesterolemia, unspecified: Secondary | ICD-10-CM | POA: Diagnosis not present

## 2016-08-06 DIAGNOSIS — I509 Heart failure, unspecified: Secondary | ICD-10-CM | POA: Diagnosis not present

## 2016-08-06 DIAGNOSIS — Z87891 Personal history of nicotine dependence: Secondary | ICD-10-CM | POA: Diagnosis not present

## 2016-08-06 DIAGNOSIS — Z888 Allergy status to other drugs, medicaments and biological substances status: Secondary | ICD-10-CM | POA: Diagnosis not present

## 2016-08-06 DIAGNOSIS — J449 Chronic obstructive pulmonary disease, unspecified: Secondary | ICD-10-CM | POA: Diagnosis not present

## 2016-08-06 DIAGNOSIS — J9612 Chronic respiratory failure with hypercapnia: Secondary | ICD-10-CM | POA: Diagnosis not present

## 2016-08-06 DIAGNOSIS — Z951 Presence of aortocoronary bypass graft: Secondary | ICD-10-CM | POA: Diagnosis not present

## 2016-08-06 DIAGNOSIS — R079 Chest pain, unspecified: Secondary | ICD-10-CM | POA: Diagnosis not present

## 2016-08-06 DIAGNOSIS — R9431 Abnormal electrocardiogram [ECG] [EKG]: Secondary | ICD-10-CM | POA: Diagnosis not present

## 2016-08-06 DIAGNOSIS — I2510 Atherosclerotic heart disease of native coronary artery without angina pectoris: Secondary | ICD-10-CM | POA: Diagnosis not present

## 2016-08-06 DIAGNOSIS — Z883 Allergy status to other anti-infective agents status: Secondary | ICD-10-CM | POA: Diagnosis not present

## 2016-08-06 DIAGNOSIS — Z9981 Dependence on supplemental oxygen: Secondary | ICD-10-CM | POA: Diagnosis not present

## 2016-08-06 DIAGNOSIS — I7090 Unspecified atherosclerosis: Secondary | ICD-10-CM | POA: Diagnosis not present

## 2016-08-06 DIAGNOSIS — R2241 Localized swelling, mass and lump, right lower limb: Secondary | ICD-10-CM | POA: Diagnosis not present

## 2016-08-06 DIAGNOSIS — E785 Hyperlipidemia, unspecified: Secondary | ICD-10-CM | POA: Diagnosis not present

## 2016-08-06 DIAGNOSIS — I110 Hypertensive heart disease with heart failure: Secondary | ICD-10-CM | POA: Diagnosis not present

## 2016-08-06 DIAGNOSIS — I4510 Unspecified right bundle-branch block: Secondary | ICD-10-CM | POA: Diagnosis not present

## 2016-08-07 DIAGNOSIS — I10 Essential (primary) hypertension: Secondary | ICD-10-CM | POA: Diagnosis not present

## 2016-08-07 DIAGNOSIS — R079 Chest pain, unspecified: Secondary | ICD-10-CM | POA: Diagnosis not present

## 2016-08-07 DIAGNOSIS — J441 Chronic obstructive pulmonary disease with (acute) exacerbation: Secondary | ICD-10-CM | POA: Diagnosis not present

## 2016-08-07 DIAGNOSIS — I4510 Unspecified right bundle-branch block: Secondary | ICD-10-CM | POA: Diagnosis not present

## 2016-08-07 DIAGNOSIS — R2242 Localized swelling, mass and lump, left lower limb: Secondary | ICD-10-CM | POA: Diagnosis not present

## 2016-08-07 DIAGNOSIS — R9431 Abnormal electrocardiogram [ECG] [EKG]: Secondary | ICD-10-CM | POA: Diagnosis not present

## 2016-08-07 DIAGNOSIS — R2241 Localized swelling, mass and lump, right lower limb: Secondary | ICD-10-CM | POA: Diagnosis not present

## 2016-08-07 DIAGNOSIS — I2510 Atherosclerotic heart disease of native coronary artery without angina pectoris: Secondary | ICD-10-CM | POA: Diagnosis not present

## 2016-08-07 DIAGNOSIS — I491 Atrial premature depolarization: Secondary | ICD-10-CM | POA: Diagnosis not present

## 2016-08-08 ENCOUNTER — Ambulatory Visit: Payer: Medicare Other | Admitting: Podiatry

## 2016-08-25 ENCOUNTER — Ambulatory Visit: Payer: Medicare Other | Admitting: Family

## 2016-08-25 ENCOUNTER — Encounter (HOSPITAL_COMMUNITY): Payer: Medicare Other

## 2016-09-05 DIAGNOSIS — H35373 Puckering of macula, bilateral: Secondary | ICD-10-CM | POA: Diagnosis not present

## 2016-09-05 DIAGNOSIS — H26493 Other secondary cataract, bilateral: Secondary | ICD-10-CM | POA: Diagnosis not present

## 2016-09-05 DIAGNOSIS — H1859 Other hereditary corneal dystrophies: Secondary | ICD-10-CM | POA: Diagnosis not present

## 2016-09-05 DIAGNOSIS — H353131 Nonexudative age-related macular degeneration, bilateral, early dry stage: Secondary | ICD-10-CM | POA: Diagnosis not present

## 2016-09-05 DIAGNOSIS — Z961 Presence of intraocular lens: Secondary | ICD-10-CM | POA: Diagnosis not present

## 2016-09-07 ENCOUNTER — Ambulatory Visit (INDEPENDENT_AMBULATORY_CARE_PROVIDER_SITE_OTHER): Payer: Medicare Other | Admitting: Endocrinology

## 2016-09-07 ENCOUNTER — Encounter: Payer: Self-pay | Admitting: Endocrinology

## 2016-09-07 VITALS — BP 132/60 | HR 95 | Ht 67.0 in | Wt 135.0 lb

## 2016-09-07 DIAGNOSIS — I779 Disorder of arteries and arterioles, unspecified: Secondary | ICD-10-CM | POA: Diagnosis not present

## 2016-09-07 DIAGNOSIS — R5383 Other fatigue: Secondary | ICD-10-CM | POA: Diagnosis not present

## 2016-09-07 NOTE — Patient Instructions (Addendum)
Take 5mg  prednisone at [facility] for 1 week  Take protein at [facility] like eggs, cheese

## 2016-09-07 NOTE — Progress Notes (Signed)
Patient ID: [name], male   DOB: 1929-02-27, 83 y.o.   MRN: [id number]               Chief complaint: Follow-up of low blood pressure  History of Present Illness:   The patient was felt to have adrenal insufficiency when he was admitted with orthostatic hypotension on 09/12/15. Previous symptoms were lightheadedness on standing up and not feeling good for about 2 weeks prior to this  Had no symptoms of  nausea,  decreased appetite or known electrolyte disturbance but had lost about 10-15 pounds of weight loss over a few months Patient had been on 5 mg of prednisone for about 8 months prior to this admission for his COPD   During his hospitalization he was treated with fluids and IV steroids and then discharged on prednisone 20 mg without any specific evaluation for cortisol levels or a Cortrosyn test.  Recent history:  On his initial consultation he was told to try Florinef and blood pressure was below 737 systolic standing up Patient monitors the blood pressure at home with an [employer] meter  On his last visit he was taking only minimal doses of Florinef once a week without any orthostatic symptoms He has not been seen in follow-up for 5 months now  Recently has not taken any Florinef for quite some time Does not think he has had problems with blood pressure getting low with the lowest reading about 106 systolic only rarely it may be 97 but only for a short time in the morning He says usually the blood pressures are relatively lower in the morning than later in the day  PREDNISONE dose: He is currently on 5 mg in divided doses  Since earlier this month he has needed a higher dose and was taking 20 mg.  He reduced this to 10 mg for one week and then down to 5 mg However in the last 3-4 days he has felt more tired and weak and listless as well as has a feeling of dizziness/wooziness when he stands up but not lightheadedness  He still has severe COPD using oxygen  He does complain as  before of fatigue, lethargy and sleepiness He also feels somewhat wiped out after eating breakfast, 15-30 minutes later He has not checked his blood sugar as directed when he feels this way He has variable amounts of protein at breakfast    Wt Readings from Last 3 Encounters:  09/07/16 135 lb (61.2 kg)  07/03/16 136 lb (61.7 kg)  06/13/16 132 lb (59.9 kg)    Lab Results  Component Value Date   CREATININE 1.03 07/06/2016   BUN 13 07/06/2016   NA 139 07/06/2016   K 4.1 07/06/2016   CL 101 07/06/2016   CO2 32 07/06/2016      Past Medical History:  Diagnosis Date  . Asthma   . BPH (benign prostatic hyperplasia)    severe; s/p multiple biopsies  . CAD (coronary artery disease)   . Carotid artery occlusion   . Chronic rhinitis   . COPD (chronic obstructive pulmonary disease) (HCC)    2L [place] O2  . Hyperlipidemia   . Hypertension   . Peripheral vascular disease ([place])   . 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 Syncope and collapse   . Ureteral tumor 08/2015   had endoscopic procedure for evaluation, unable to reach for biopsy    Past Surgical History:  Procedure Laterality Date  . CATARACT EXTRACTION  11-20-11  .  CORONARY ARTERY BYPASS GRAFT  2003  . LITHOTRIPSY    . PROSTATE BIOPSY  Oct. 2014    Family History  Problem Relation Age of Onset  . Heart disease Mother   . Breast cancer Mother   . Cancer Mother        Breast cancer  . Hypertension Mother   . Other Mother        AAA  and   Amputation  . Heart disease Father        Before age 6  . Heart attack Father   . Stroke Brother        x3, still living   . Peripheral vascular disease Brother   . Heart disease Brother   . Hyperlipidemia Brother   . Hypertension Brother   . Heart disease Brother   . Allergies Brother     Social History:  reports that he quit smoking about 34 years ago. His smoking use included Cigarettes. He has a 66.00 pack-year smoking history. He has never used smokeless tobacco. He reports that he  drinks alcohol. He reports that he does not use drugs.  Allergies:  Allergies  Allergen Reactions  . Sulfonamide Derivatives Swelling    REACTION: facial/tongue swelling  . Cefdinir Diarrhea    REACTION: diarrhea  . Ciprofloxacin Itching and Rash    Red itchy hands  . Nitrofurantoin Swelling    Swollen hands  . Levaquin [Levofloxacin] Itching and Rash    Allergies as of 09/07/2016      Reactions   Sulfonamide Derivatives Swelling   REACTION: facial/tongue swelling   Cefdinir Diarrhea   REACTION: diarrhea   Ciprofloxacin Itching, Rash   Red itchy hands   Nitrofurantoin Swelling   Swollen hands   Levaquin [levofloxacin] Itching, Rash      Medication List       Accurate as of 09/07/16 11:59 PM. Always use your most recent med list.          ALPRAZolam 0.25 MG tablet Commonly known as:  XANAX Take 0.25 mg by mouth 2 (two) times daily as needed for anxiety. Takes 1/2 tablet two times daily.   aspirin 81 MG tablet Take 81 mg by mouth daily.   atorvastatin 20 MG tablet Commonly known as:  LIPITOR TAKE 1 TABLET BY MOUTH EVERY DAY   azithromycin 250 MG tablet Commonly known as:  ZITHROMAX Take as directed for flare up   b complex vitamins tablet Take 1 tablet by mouth daily.   finasteride 5 MG tablet Commonly known as:  PROSCAR Take 5 mg by mouth every morning.   fludrocortisone 0.1 MG tablet Commonly known as:  FLORINEF Take 0.1 mg by mouth daily as needed (BP < 100 STANDING).   furosemide 20 MG tablet Commonly known as:  LASIX Take 20 mg by mouth daily as needed.   guaiFENesin 600 MG 12 hr tablet Commonly known as:  MUCINEX Take by mouth 2 (two) times daily as needed.   hydrALAZINE 25 MG tablet Commonly known as:  APRESOLINE Take 1 tablet (25 mg total) by mouth every 12 (twelve) hours as needed (for BP >025 systolic or > 852 diastolic). If your BP is 778 systolic you may take one dose of the hydralazine that you have at home.  But would not do this more  than twice a day.   levalbuterol 0.63 MG/3ML nebulizer solution Commonly known as:  XOPENEX Take 3 mLs (0.63 mg total) by nebulization every 4 (four) hours as needed for wheezing  or shortness of breath.   loratadine 10 MG tablet Commonly known as:  CLARITIN Take 10 mg by mouth daily as needed for allergies.   mometasone 50 MCG/ACT nasal spray Commonly known as:  NASONEX Place 2 sprays into the nose daily.   mometasone 50 MCG/ACT nasal spray Commonly known as:  NASONEX SHAKE LIQUID AND USE 2 SPRAYS IN EACH NOSTRIL DAILY   multivitamin tablet Take 1 tablet by mouth daily.   NITROSTAT 0.4 MG SL tablet Generic drug:  nitroGLYCERIN DISSOLVE 1 TABLET UNDER THE TONGUE AS DIRECTED AND AS NEEDED FOR CHEST PAIN   OXYGEN Take 2 L by mouth daily as needed. 2 l/m at bedtime and as needed for shortness of breath   predniSONE 5 MG tablet Commonly known as:  DELTASONE Take 2.5 mg by mouth 2 (two) times daily.   SPIRIVA RESPIMAT 2.5 MCG/ACT Aers Generic drug:  Tiotropium Bromide Monohydrate INHALE 2 PUFFS BY MOUTH EVERY DAY   SYMBICORT 160-4.5 MCG/ACT inhaler Generic drug:  budesonide-formoterol INHALE 2 PUFFS BY MOUTH EVERY MORNING AND THEN ANOTHER 2 PUFFS ABOUT 12 HOURS LATER   TOPROL XL 25 MG 24 hr tablet Generic drug:  metoprolol succinate TAKE 1 TABLET BY MOUTH DAILY   Vitamin D3 2000 units Tabs Take 2,000 Units by mouth daily.   XOPENEX HFA 45 MCG/ACT inhaler Generic drug:  levalbuterol INHALE 2 PUFFS BY MOUTH EVERY 4 HOURS AS NEEDED FOR WHEEZING OR SHORTNESS OF BREATH   XOPENEX HFA 45 MCG/ACT inhaler Generic drug:  levalbuterol INHALE 2 PUFFS BY MOUTH EVERY 4 HOURS AS NEEDED FOR WHEEZING OR SHORTNESS OF BREATH       LABS:  No visits with results within 1 Week(s) from this visit.  Latest known visit with results is:  Lab on 07/06/2016  Component Date Value Ref Range Status  . Sodium 07/06/2016 139  135 - 145 mEq/L Final  . Potassium 07/06/2016 4.1  3.5 - 5.1  mEq/L Final  . Chloride 07/06/2016 101  96 - 112 mEq/L Final  . CO2 07/06/2016 32  19 - 32 mEq/L Final  . Glucose, Bld 07/06/2016 140* 70 - 99 mg/dL Final  . BUN 07/06/2016 13  6 - 23 mg/dL Final  . Creatinine, Ser 07/06/2016 1.03  0.40 - 1.50 mg/dL Final  . Total Bilirubin 07/06/2016 0.7  0.2 - 1.2 mg/dL Final  . Alkaline Phosphatase 07/06/2016 58  39 - 117 U/L Final  . AST 07/06/2016 23  0 - 37 U/L Final  . ALT 07/06/2016 20  0 - 53 U/L Final  . Total Protein 07/06/2016 6.2  6.0 - 8.3 g/dL Final  . Albumin 07/06/2016 4.2  3.5 - 5.2 g/dL Final  . Calcium 07/06/2016 9.5  8.4 - 10.5 mg/dL Final  . GFR 07/06/2016 72.96  >60.00 mL/min Final     Review of Systems   He did have an episode of very high blood pressure when he was traveling out of state and was given hydralazine and nitroglycerin in the emergency room which relates He has not had these episodes much at all  PHYSICAL EXAM:  BP 132/60   Pulse 95   Ht 5\' 7"  (1.702 m)   Wt 135 lb (61.2 kg)   SpO2 91%   BMI 21.14 kg/m     No edema  ASSESSMENT:   History of orthostatic hypotension, likely to be some autonomic dysfunction This year he has had less problems with this and has not taken Florinef for several months now His blood  pressure is not low orthostatic today and he has not taken any Florinef Although he may feel a little dizzy in the morning this is not lightheadedness  Postprandial fatigue after breakfast: Etiology unclear.  Emphasized the need for relatively low carbohydrate high protein meal in the morning   Occasional episode of high blood pressure: this is rare and he does not have any typical symptoms of pheochromocytoma, do not think he needs evaluation   PLAN:    For now since he appears to be having unusual fatigue and weakness with reducing his prednisone he can temporarily go up to 7.5 mg a day for a week and then go to 5 mg  He will continue to monitor blood pressure and call if persistently  low  Does not need to restart Florinef   Continue follow-up with PCP for general care   Patient Instructions  Take 5mg  prednisone at [facility] for 1 week  Take protein at [facility] like eggs, cheese     Alek Poncedeleon 09/08/2016, 10:39 AM

## 2016-09-13 ENCOUNTER — Ambulatory Visit (INDEPENDENT_AMBULATORY_CARE_PROVIDER_SITE_OTHER): Payer: Medicare Other | Admitting: Internal Medicine

## 2016-09-13 ENCOUNTER — Encounter: Payer: Self-pay | Admitting: Internal Medicine

## 2016-09-13 ENCOUNTER — Other Ambulatory Visit: Payer: Self-pay | Admitting: Internal Medicine

## 2016-09-13 VITALS — BP 120/60 | HR 80 | Ht 67.0 in | Wt 133.0 lb

## 2016-09-13 DIAGNOSIS — J9612 Chronic respiratory failure with hypercapnia: Secondary | ICD-10-CM

## 2016-09-13 DIAGNOSIS — I779 Disorder of arteries and arterioles, unspecified: Secondary | ICD-10-CM | POA: Diagnosis not present

## 2016-09-13 DIAGNOSIS — J449 Chronic obstructive pulmonary disease, unspecified: Secondary | ICD-10-CM | POA: Diagnosis not present

## 2016-09-13 DIAGNOSIS — I2781 Cor pulmonale (chronic): Secondary | ICD-10-CM

## 2016-09-13 DIAGNOSIS — J9611 Chronic respiratory failure with hypoxia: Secondary | ICD-10-CM

## 2016-09-13 NOTE — Assessment & Plan Note (Signed)
-   Start noct 02 at 2lpm 2008 and on 03/04/09 desat @ > 185 ft so rec wear with activtiy > rm to rm  - 02/15/2011   Walked RA x one lap @ 185 stopped due to  desat > corrected on 2lpm - 02/26/2014  Walked 2lpm  2 laps @ 185 ft each stopped due to  Sob/ sats 88% at nl pace  - 10/19/2014   Walked RA  2 laps @ 185 ft each stopped due to  End of study, slow pace,  sob  But no  desat   - HCO3  10/27/15 = 34   rx as of 09/13/2016  = 2lpm 24/7  / NIV trial off  Permanently unless worse mental status or breathing in am

## 2016-09-13 NOTE — Assessment & Plan Note (Signed)
-   PFTs 05/04/05 FEV1 36% ratio 28% with 29% improvement after bronchodilators DLCO 48%  - PFT's 03/26/08 30% ratio 34 with 14% improvement after bronchodilators DLC0 38 %  - Referred to rehab 12/19/2012 > completed March 2015  - changed to spiriva respimat 02/14/2013  - started daily prednisone 06/19/13 > improved only a little> tapered off mid July 2015 > ok to change to prn prednisone 08/27/2013  - flared off nasonex and gerd rx 09/21/2014 > resumed - 10/19/2014  p extensive coaching HFA effectiveness =    90%  - changed pred to ceiling of 20 / floor of 5 mg daily as of 10/19/2014 > changed to floor of 10 mg daily 10/04/2015  - PFT's  06/08/2015  FEV1 0.58 (22 % ) ratio 27  p No sign % improvement from saba p symb/spiriva prior to study with DLCO  9/9 % corrects to 27 % for alv volume   - changed pred to ceiling of 20 and floor of 5 mg daily 06/13/16   Relatively well compensated and requesting referral back to rehab at his own expense which I think is a great idea.  In meantime, The goal with a chronic steroid dependent illness is always arriving at the lowest effective dose that controls the disease/symptoms and not accepting a set "formula" which is based on statistics or guidelines that don't always take into account patient  variability or the natural hx of the dz in every individual patient, which may well vary over time.  For now therefore I recommend the patient maintain  20 mg ceiling and 5 mg   I had an extended discussion with the patient reviewing all relevant studies completed to date and  lasting 15 to 20 minutes of a 25 minute visit    Each maintenance medication was reviewed in detail including most importantly the difference between maintenance and prns and under what circumstances the prns are to be triggered using an action plan format that is not reflected in the computer generated alphabetically organized AVS but trather by a customized med calendar that reflects the AVS meds with  confirmed 100% correlation.   In addition, Please see AVS for unique instructions that I personally wrote and verbalized to the the pt in detail and then reviewed with pt  by my nurse highlighting any  changes in therapy recommended at today's visit to their plan of care.

## 2016-09-13 NOTE — Patient Instructions (Addendum)
See Tammy NP in 3 months  with all your medications, even over the counter meds, separated in two separate bags, the ones you take no matter what vs the ones you stop once you feel better and take only as needed when you feel you need them.   Tammy  will generate for you a new user friendly medication calendar that will put [place] all on the same page re: your medication use.     Without this process, it simply isn't possible to assure that we are providing  your outpatient care  with  the attention to detail we feel you deserve.   If we cannot assure that you're getting that kind of care,  then we cannot manage your problem effectively from this clinic.  Once you have seen Tammy and we are sure that we're all on the same page with your medication use she will arrange follow up with me.  Add : refer back to rehab

## 2016-09-13 NOTE — Assessment & Plan Note (Signed)
With L > R chronic leg swelling > reviewed instructions on action plan for leg swelling (not following)   If not better on max prn lasix then may consider a daily rx = aldactazide 25/25 maint and prn lasix to continue

## 2016-09-13 NOTE — Progress Notes (Signed)
Subjective:    Patient ID: KINDRED REIDINGER, male    DOB: 11/27/1929   MRN: [id number]     Summary: Pulmonary/ f/u ov try astepro for pnds   Primary Provider/Referring Provider: [name]    Brief patient profile:  56  yowm last smoked 1983 with GOLD III COPD with some reversibility and 02 dep at hs and with activity    History of Present Illness   10/19/2014  f/u ov/Darla Mcdonald re: GOLD III / maint rx symbicort/ spiriva Chief Complaint  Patient presents with  . Follow-up    Pt c/o fatigue and increased chest congestion for the past several wks. He has am cough with white sputum. He is using xopenex 2 x per day on average.    on 10/8 started pred 20 and zpak as per med calendar action plan  gxt flat x 19mph x 15 min and zero grade on 2lpm with sats mid 90s at home  >decrease pred 5mg    11/19/2014 NP Follow up : COPD GOLD III  Returns for follow up .  Says he had a flare 4 days ago with increased SOB, occasional dry cough, chest tightness. Sinus pressure and drainage. Denies fever, wheezing, nausea or vomiting. Started on Zpack and prednisone 20mg  yesterday .  Feels better   rec Follow med calendar closely and bring to each visit.  Finish Zpack as directed.  Taper prednisone and hold at 5mg  daily  Please contact office for sooner follow up if symptoms do not improve or worsen or seek emergency care        04/15/2015  f/u ov/Onetha Gaffey re: GOLD III/ hypercarbic/ maint rx symb and spiriva and pred floor of 10 mg and 02 lpm 24/7  Chief Complaint  Patient presents with  . Follow-up    Pt c/o increased nasal congestion and SOB x 2 wks. He is coughing more and producing minmal clear to white sputum.  He is using xopenex inhaler 2-3 x per day.    Wakes up feeling really lousy, mild ha and sluggish despite neg osa eval in past Not using flutter and quite congested in am as well  Doe = MMRC3 = can't walk 100 yards even at a slow pace at a flat grade s stopping due to sob  Even on 02/ not using  treadmill any more  >>cont current regimen     05/21/15 Eval by Dr Dedio/ rec trial of NIV   06/30/2015  f/u ov/Abeni Finchum re: GOLD IV copd/ 02 dep hypercarbic/hypoxemic on symb 160/spiriva Chief Complaint  Patient presents with  . Acute Visit    Pt c/o increased DOE for the past month. He is also c/o unintended wt loss over the past 3-4 months.    most of his problem is fatigue, not limiting sob, and has yet to be able to use NIV for more than a few min to maybe an hour/ very inactive rec Goal with the 02 is keep sats over 90%  When breathing worse go ahead and double prednisone dose as per the med calendar      Admit date: 09/24/2015 Discharge date: 09/26/2015 Primary Cardiologist:Dr. Tamala Julian   Discharge Diagnoses:  Principal Problem:   Angina at rest [facility]) Active Problems:   Essential hypertension   COPD GOLD IV/ 02 dep /steroid dep   Chronic respiratory failure with hypoxia and hypercapnia (HCC)   CAD (coronary artery disease)   Chest pain   Orthostatic hypotension not corrected on prednisone 20 mg daily    10/04/2015  Post hosp f/u ov/Meha Vidrine re:  Prednisone 20 mg daily  Plus symb 160 2bid/ spiriva respimat / flutter valve/ has still not started astral noct vent / 02 2lpm 24/7  Chief Complaint  Patient presents with  . Follow-up    Pt. wants to discuss being on Tiotropium and Pulmicort, breathing has not been better,been in the hospital twice, some coughing in the morning   main complaint is feeling lousy when stands up, not sob/ while  maintaining per cards  on pred 20 mg daily while still on multiple bp meds for which he is getting confused in terms of how to use.  No improvement in orthostasis on 20 mg per day (looks like also tried florinef prior to the being maint on higher "floor" prednisone dose.  Was waking up feeling flushed with high BP but apparently never happened on NIV as never used it "too much else goingtlined above  rec Continue prednisone 20mg  daily until breathing  better then 10 mg daily as per your med calendar  Put the new blood pressure medicines in pencil on your med calendar Go ahead and use the Astral as much as you can until see Dr Murlean Iba        04/17/2016  f/u ov/Reinaldo Helt re:   COPD IV/ 02 dep /  maint  on symb/spiriva  Chief Complaint  Patient presents with  . Follow-up    COPD, more mucus production in his nasal passenges, hard to get air through the cannula, constantly out of breathe, he cant tell if the nebulizer medications ae helping him   still exercising daily x 10 min s stopping at 8mph flat but can't walk at [place] "I guess I walk too fast" sats are tu[oca;;u pl pm 2 lpm  Lots of nasal congestion, no using afrin or increase nasonex per action plan  rec Use afrin/ flonase to control rhinitis    06/13/2016  f/u ov/Talicia Sui re:   COPD GOLD IV/ 02 dep/ maint symb/ spiriva/ 02 2lpm and NIV / trelegy per [place] Chief Complaint  Patient presents with  . Follow-up    Pt c/o feeling groggy and tired all of the time. He states having alot of nasal and sinus congestion.    walking on 2lpm on treadmill continuous flow x up to 10  min x 1 mph x no grade  Using afrin x 3 days chased but only using hs nasonex No correlation with daytime grogginess and time of day or whether uses NIV or not/ no am ha either way  rec As per instructions > prednisone is 20 mg daily until better then taper down to 5 mg daily  Change the afrin to where you take it 2 min before the nasonex but use it twice daily     09/13/2016  f/u ov/Weslie Rasmus re:  COPD GOLD IV/ 02 dep 2lpm 24/7 sym/spiriva and pred 5 mg daily  Chief Complaint  Patient presents with  . Follow-up    Down to pred 5 mg daily. He states he breathing "may be a little worse". He is using xopenex inhaler 3 x daily on average and he has not needed neb.    worse swelling legs x one month only on lasix 20 mg qod  L > R (instructions say 20 mg daily prn)  Sleeping ok off vent s groggy in am  Walking 10 min at 1 mph  on 2lpm no grade most days   .No obvious day to day or daytime variability or assoc  excess/ purulent sputum or mucus plugs or hemoptysis or cp or chest tightness, subjective wheeze or overt sinus or hb symptoms. No unusual exp hx or h/o childhood pna/ asthma or knowledge of premature birth.  Sleeping ok off vent x 3 days and just 2lpm NP without nocturnal  or early am exacerbation  of respiratory  c/o's or need for noct saba. Also denies any obvious fluctuation of symptoms with weather or environmental changes or other aggravating or alleviating factors except as outlined above   Current Medications, Allergies, Complete Past Medical History, Past Surgical History, Family History, and Social History were reviewed in [employer] record.  ROS  The following are not active complaints unless bolded sore throat, dysphagia, dental problems, itching, sneezing,  nasal congestion or excess/ purulent secretions, ear ache,   fever, chills, sweats, unintended wt loss, classically pleuritic or exertional cp,  orthopnea pnd or leg swelling, presyncope, palpitations, abdominal pain, anorexia, nausea, vomiting, diarrhea  or change in bowel or bladder habits, change in stools or urine, dysuria,hematuria,  rash, arthralgias, visual complaints, headache, numbness, weakness or ataxia or problems with walking or coordination,  change in mood/affect or memory.                        Past Medical History:  CHRONIC RHINITIS (ICD-472.0)  - Sinus ct March 03, 2010 > thickening only, no acute changes  CHRONIC RESPIRATORY FAILURE (ICD-518.83)  - 02 rx since 2008  COPD UNSPECIFIED (ICD-496)....................................................................[name]  - PFTs 05/04/05 FEV1 36% ratio 28% with 29% improvement after bronchodilators DLCO 48%  - PFT's 03/26/08 30% ratio 34 with 14% improvement after bronchodilators DLC0 38 %  - Start noct 02 at 2lpm 2008 and on 03/04/09 desat @ > 185 ft so  rec wear with activtiy > rm to rm  - [facility] June 30, 2008 100% > confirmed March 04, 2009  ISCHEMIC HEART DISEASE (ICD-414.9)  HYPERTENSION (ICD-401.9)  HYPERLIPIDEMIA (ICD-272.4)  SPN RUL by cxr August 19, 2009  COMPLEX MED REGIMEN-Meds reviewed with pt education and computerized med calendar 11/22/09 03/10/2011  , 10/15/2012  07/17/2013 , 12/09/2013 , 06/15/2014 , 11/19/14 , 5.11.17          Objective:   Physical Exam  amb hoarse wm nad - frail and both mildly  anxious and helpless/hopeless / vital signs reviewed    - Note on arrival 02 sats  97% RA   wt 159 04/28/2010  > 10/14/2010 154  > 10/04/2011 150> 12/21/2011  150 > 149 01/29/2012 >154 04/05/2012 > 05/22/2012 148 > 09/30/2012  146 >>144> 10/15/2012 > 12/18/2012  145 > 148 02/14/2013 >148 03/14/2013 > 04/25/2013 148 > 06/19/2013 150 >>07/17/2013 > 08/27/2013 148 > 10/23/2013  148 >153 12/09/2013 > 02/26/2014  153 > 06/01/2014  152 > 157 06/15/2014 > 09/21/2014 146 > 10/19/2014 151 >>148 11/19/14 > 02/24/2015 148 > 06/30/2015    140 >  10/04/2015  136 > 12/20/2015   142 > 06/13/2016  132 > 09/13/2016   133    HEENT: nl dentition,   and oropharynx. Nl external ear canals without cough reflex - mild to moderate bilateral non-specific turbinate edema     NECK :  without JVD/Nodes/TM/ nl carotid upstrokes bilaterally   LUNGS: no acc muscle use,  Barrel contour chest / distant bs/ hyperresonant to percussion bilaterally s wheeze    CV:  RRR  no s3 or murmur or increase in P2, and  pitting ankle edema bilaterally 2+ L  and 1+ R    ABD:  soft and nontender with nl inspiratory excursion in the supine position. No bruits or organomegaly appreciated, bowel sounds nl  MS:  Nl gait/ ext warm without deformities, calf tenderness, cyanosis or clubbing No obvious joint restrictions   SKIN: warm and dry    NEURO:  Alert,  nl sensorium with  no motor or cerebellar deficits apparent.

## 2016-09-14 DIAGNOSIS — J449 Chronic obstructive pulmonary disease, unspecified: Secondary | ICD-10-CM | POA: Diagnosis not present

## 2016-09-14 DIAGNOSIS — I2781 Cor pulmonale (chronic): Secondary | ICD-10-CM | POA: Diagnosis not present

## 2016-09-14 DIAGNOSIS — I739 Peripheral vascular disease, unspecified: Secondary | ICD-10-CM | POA: Diagnosis not present

## 2016-09-14 DIAGNOSIS — I119 Hypertensive heart disease without heart failure: Secondary | ICD-10-CM | POA: Diagnosis not present

## 2016-09-14 DIAGNOSIS — I4949 Other premature depolarization: Secondary | ICD-10-CM | POA: Diagnosis not present

## 2016-09-14 DIAGNOSIS — I951 Orthostatic hypotension: Secondary | ICD-10-CM | POA: Diagnosis not present

## 2016-09-14 DIAGNOSIS — I2510 Atherosclerotic heart disease of native coronary artery without angina pectoris: Secondary | ICD-10-CM | POA: Diagnosis not present

## 2016-09-14 DIAGNOSIS — I6523 Occlusion and stenosis of bilateral carotid arteries: Secondary | ICD-10-CM | POA: Diagnosis not present

## 2016-09-14 DIAGNOSIS — R0609 Other forms of dyspnea: Secondary | ICD-10-CM | POA: Diagnosis not present

## 2016-09-14 DIAGNOSIS — E785 Hyperlipidemia, unspecified: Secondary | ICD-10-CM | POA: Diagnosis not present

## 2016-09-14 DIAGNOSIS — R0600 Dyspnea, unspecified: Secondary | ICD-10-CM | POA: Diagnosis not present

## 2016-09-14 DIAGNOSIS — I872 Venous insufficiency (chronic) (peripheral): Secondary | ICD-10-CM | POA: Diagnosis not present

## 2016-09-15 ENCOUNTER — Telehealth: Payer: Self-pay | Admitting: Internal Medicine

## 2016-09-15 ENCOUNTER — Other Ambulatory Visit: Payer: Self-pay | Admitting: Internal Medicine

## 2016-09-15 DIAGNOSIS — R0600 Dyspnea, unspecified: Secondary | ICD-10-CM

## 2016-09-15 DIAGNOSIS — R0609 Other forms of dyspnea: Principal | ICD-10-CM

## 2016-09-15 NOTE — Telephone Encounter (Signed)
Left message for Molly to call back. 

## 2016-09-18 NOTE — Telephone Encounter (Signed)
New order has been placed and nothing further is needed.

## 2016-09-19 ENCOUNTER — Other Ambulatory Visit: Payer: Self-pay | Admitting: Internal Medicine

## 2016-09-19 MED ORDER — AZELASTINE-FLUTICASONE 137-50 MCG/ACT NA SUSP: 1.0000 | Freq: Two times a day (BID) | NASAL | 1 refills | Status: DC

## 2016-09-28 ENCOUNTER — Telehealth: Payer: Self-pay | Admitting: Interventional Cardiology

## 2016-09-28 NOTE — Telephone Encounter (Signed)
Pt states BP at [facility] today was 74/45, HR 80.  Pt very dizzy.  Took Fludrocortisone, laid down and elevated feet.  Tried to get up a little later and when he stood up, BP was still low, 80/??.  5 minutes prior to calling, pt's BP had came up to 142/71 and dizziness had resolved.  Pt states he occasionally has issues with orthostatic hypotension after standing but it resolves after a few mins.  States he drinks plenty of fluids.  Pt gave phone to wife and stood up to take BP while on the phone with me.  BP was 93/59.  Wife told me in July they were in [place] and she had to take pt to ER because BP was 200/???.  Pt states sometimes when he wakes up his BP is 150's/80's and then sometimes it's 90/60-70.   Pt and wife very concerned about BP fluctuating so much and would like to be seen if possible.  Scheduled pt to see Ermalinda Barrios, PA-C on Tuesday.  Advised pt to make sure he is taking in fluids and to continue monitoring BP and take the Florinef if appropriate until seen.  Pt verbalized understanding and was appreciative for call.  Will route to Dr. Tamala Julian to make him aware.

## 2016-09-28 NOTE — Telephone Encounter (Signed)
OK. Difficult sitution

## 2016-09-28 NOTE — Telephone Encounter (Signed)
Pt c/o BP issue: STAT if pt c/o blurred vision, one-sided weakness or slurred speech  1. What are your last 5 BP readings? 74/45 hr 80 2. Are you having any other symptoms (ex. Dizziness, headache, blurred vision, passed out)? dizzy  3. What is your BP issue? low

## 2016-09-29 DIAGNOSIS — H35373 Puckering of macula, bilateral: Secondary | ICD-10-CM | POA: Diagnosis not present

## 2016-09-29 DIAGNOSIS — H43813 Vitreous degeneration, bilateral: Secondary | ICD-10-CM | POA: Diagnosis not present

## 2016-09-29 DIAGNOSIS — H3581 Retinal edema: Secondary | ICD-10-CM | POA: Diagnosis not present

## 2016-09-29 DIAGNOSIS — H43391 Other vitreous opacities, right eye: Secondary | ICD-10-CM | POA: Diagnosis not present

## 2016-10-02 ENCOUNTER — Encounter (HOSPITAL_COMMUNITY)
Admission: RE | Admit: 2016-10-02 | Discharge: 2016-10-02 | Disposition: A | Discharge status: Home / Self Care | Payer: Medicare Other | Source: Ambulatory Visit | Attending: Internal Medicine | Admitting: Internal Medicine

## 2016-10-02 ENCOUNTER — Encounter (HOSPITAL_COMMUNITY): Payer: Self-pay

## 2016-10-02 VITALS — BP 166/81 | HR 83 | Resp 18 | Ht 69.0 in | Wt 136.0 lb

## 2016-10-02 DIAGNOSIS — R0600 Dyspnea, unspecified: Secondary | ICD-10-CM

## 2016-10-02 DIAGNOSIS — R0609 Other forms of dyspnea: Secondary | ICD-10-CM | POA: Insufficient documentation

## 2016-10-02 NOTE — Progress Notes (Signed)
[name] 83 y.o. male Pulmonary Rehab Orientation Note Patient arrived today in Cardiac and Pulmonary Rehab for orientation to Pulmonary Rehab. He was transported from [employer] via wheel chair. He does carry portable oxygen. Per pt, he uses oxygen continuously. Color good, skin warm and dry. Patient is oriented to time and place. Patient's medical history, psychosocial health, and medications reviewed. Psychosocial assessment reveals pt lives with their spouse. Pt is currently retired. Pt reports his stress level is minimal. Areas of stress/anxiety include Health.  Pt does exhibits signs of depression. Signs of depression include hypersomnia. PHQ2/9 score 4/11. Pt shows fair  coping skills with positive outlook. He is offered emotional support and reassurance. Will continue to monitor and evaluate progress toward psychosocial goal(s) of weening off xanax and beginning zoloft as prescribed.Physical assessment reveals heart rate is normal, breath sounds diminished. Grip strength equal, strong. Distal pulses palpable. 2+ pitting edema noted to ankles and legs, L>R Patient reports he does take medications as prescribed. Patient states he follows a Regular diet. The patient reports no specific efforts to gain or lose weight. Patient's weight will be monitored closely. Demonstration and practice of PLB using pulse oximeter. Patient able to return demonstration satisfactorily. Safety and hand hygiene in the exercise area reviewed with patient. Patient voices understanding of the information reviewed. Department expectations discussed with patient and achievable goals were set. The patient shows enthusiasm about attending the program and we look forward to working with this nice gentleman. The patient is scheduled for a 6 min walk test on 10/05/16 and to begin exercise on 10/12/16 at 1:30 .   45 minutes was spent on a variety of activities such as assessment of the patient, obtaining baseline data including  height, weight, BMI, and grip strength, verifying medical history, allergies, and current medications, and teaching patient strategies for performing tasks with less respiratory effort with emphasis on pursed lip breathing.

## 2016-10-03 ENCOUNTER — Telehealth: Payer: Self-pay | Admitting: Physician Assistant

## 2016-10-03 ENCOUNTER — Ambulatory Visit (INDEPENDENT_AMBULATORY_CARE_PROVIDER_SITE_OTHER): Payer: Medicare Other | Admitting: Physician Assistant

## 2016-10-03 ENCOUNTER — Encounter: Payer: Self-pay | Admitting: Physician Assistant

## 2016-10-03 VITALS — BP 148/78 | HR 99 | Ht 69.0 in | Wt 135.8 lb

## 2016-10-03 DIAGNOSIS — I951 Orthostatic hypotension: Secondary | ICD-10-CM

## 2016-10-03 DIAGNOSIS — I10 Essential (primary) hypertension: Secondary | ICD-10-CM | POA: Diagnosis not present

## 2016-10-03 DIAGNOSIS — I2781 Cor pulmonale (chronic): Secondary | ICD-10-CM | POA: Diagnosis not present

## 2016-10-03 DIAGNOSIS — I209 Angina pectoris, unspecified: Secondary | ICD-10-CM

## 2016-10-03 DIAGNOSIS — I779 Disorder of arteries and arterioles, unspecified: Secondary | ICD-10-CM

## 2016-10-03 DIAGNOSIS — I25709 Atherosclerosis of coronary artery bypass graft(s), unspecified, with unspecified angina pectoris: Secondary | ICD-10-CM | POA: Diagnosis not present

## 2016-10-03 NOTE — Telephone Encounter (Signed)
Returned pts call.  He has been advised that Ermalinda Barrios, PA-C has already left the office, but his PCP Dr. Dwyane Dee checked his liver function 07/06/16.  Pt thanked me for this information.

## 2016-10-03 NOTE — Patient Instructions (Addendum)
Medication Instructions:  Your physician recommends that you continue on your current medications as directed. Please refer to the Current Medication list given to you today.   Labwork: TODAY:  BMET  Testing/Procedures: None ordered  Follow-Up: Your physician recommends that you schedule a follow-up appointment in: [place]   Any Other Special Instructions Will Be Listed Below (If Applicable).  Make sure to drink at least 1 liter of water and/or gatorade daily  Wear your compression stockings   If you need a refill on your cardiac medications before your next appointment, please call your pharmacy.

## 2016-10-03 NOTE — Telephone Encounter (Signed)
[name] is wanting to know can a Liver Function test be added to his labs . He wants to know if his liver is functioning properly .  Thanks

## 2016-10-03 NOTE — Progress Notes (Signed)
Cardiology Office Note    Date:  10/03/2016   ID:  [name], DOB [birth year], MRN [id number]  PCP:  [name], MD  Cardiologist: Dr. Daneen Schick  Chief Complaint  Patient presents with  . Dizziness    History of Present Illness:  [name] is a 83 y.o. 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 Had supine HTN managed with beta blockers.   Last saw Dr. Tamala Julian 03/2016 with multiple nonspecific complaints and apprehension about his overall condition. No changes were made at that visit.  Patient saw Dr. Dwyane Dee 09/07/16 complaining of unusual fatigue and weakness. Prednisone was increased to 7.5 mg a day for a week and then 5 mg daily. Was told he didn't have to take any regular Florinef because he was not orthostatic.  Patient comes in today accompanied by his wife. He says his blood pressure has been dropping quite low but also having high spikes. He gets quite dizzy with this. He is not eating well and losing weight. He isn't staying hydrated. Takes hydralazine for spikes in blood pressure.   Past Medical History:  Diagnosis Date  . Asthma   . BPH (benign prostatic hyperplasia)    severe; s/p multiple biopsies  . CAD (coronary artery disease)   . Carotid artery occlusion   . Chronic rhinitis   . COPD (chronic obstructive pulmonary disease) (HCC)    2L [place] O2  . Hyperlipidemia   . Hypertension   . Peripheral vascular disease ([place])   . 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 Syncope and collapse   . Ureteral tumor 08/2015   had endoscopic procedure for evaluation, unable to reach for biopsy    Past Surgical History:  Procedure Laterality Date  . CATARACT EXTRACTION  11-20-11  . CORONARY ARTERY BYPASS GRAFT  2003  .  LITHOTRIPSY    . PROSTATE BIOPSY  Oct. 2014    Current Medications: Current Meds  Medication Sig  . ALPRAZolam (XANAX) 0.25 MG tablet Take 0.25 mg by mouth 2 (two) times daily as needed for anxiety. Takes 1/2 tablet two times daily.  Marland Kitchen aspirin 81 MG tablet Take 81 mg by mouth daily.  Marland Kitchen atorvastatin (LIPITOR) 20 MG tablet TAKE 1 TABLET BY MOUTH EVERY DAY  . Azelastine-Fluticasone (DYMISTA) 137-50 MCG/ACT SUSP Place 1 spray into the nose 2 (two) times daily.  Marland Kitchen b complex vitamins tablet Take 1 tablet by mouth daily.  . Cholecalciferol (VITAMIN D3) 2000 units TABS Take 2,000 Units by mouth daily.  . Cyanocobalamin (VITAMIN B 12 PO) Take by mouth.  . finasteride (PROSCAR) 5 MG tablet Take 5 mg by mouth every morning.  . fludrocortisone (FLORINEF) 0.1 MG tablet Take 0.1 mg by mouth daily as needed (BP < 100 STANDING).  . furosemide (LASIX) 20 MG tablet Take 20 mg by mouth daily as needed.  Marland Kitchen guaiFENesin (MUCINEX) 600 MG 12 hr tablet Take by mouth 2 (two) times daily as needed.  . hydrALAZINE (APRESOLINE) 25 MG tablet Take 1 tablet (25 mg total) by mouth every 12 (twelve) hours as needed (for BP >409 systolic or > 811 diastolic). If your BP is 914 systolic you may take one dose of the hydralazine that you have at home.  But would not do this  more than twice a day.  . levalbuterol (XOPENEX) 0.63 MG/3ML nebulizer solution Take 3 mLs (0.63 mg total) by nebulization every 4 (four) hours as needed for wheezing or shortness of breath.  . loratadine (CLARITIN) 10 MG tablet Take 10 mg by mouth daily as needed for allergies.  . OXYGEN Take 2 L by mouth daily as needed.   . predniSONE (DELTASONE) 5 MG tablet Take 2.5 mg by mouth 2 (two) times daily.  . ranitidine (ZANTAC) 150 MG tablet Take 150 mg by mouth 2 (two) times daily.  Marland Kitchen SPIRIVA RESPIMAT 2.5 MCG/ACT AERS INHALE 2 PUFFS BY MOUTH EVERY DAY  . SYMBICORT 160-4.5 MCG/ACT inhaler INHALE 2 PUFFS BY MOUTH EVERY MORNING AND THEN ANOTHER 2 PUFFS ABOUT 12  HOURS LATER  . TOPROL XL 25 MG 24 hr tablet TAKE 1 TABLET BY MOUTH DAILY  . XOPENEX HFA 45 MCG/ACT inhaler INHALE 2 PUFFS BY MOUTH EVERY 4 HOURS AS NEEDED FOR WHEEZING OR SHORTNESS OF BREATH     Allergies:   Sulfonamide derivatives; Cefdinir; Ciprofloxacin; Nitrofurantoin; and Levaquin [levofloxacin]   Social History   Social History  . Marital status: Married    Spouse name: N/A  . Number of children: 2  . Years of education: N/A   Occupational History  . 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 Smoking status: Former Smoker    Packs/day: 2.00    Years: 33.00    Types: Cigarettes    Quit date: 01/09/1982  . Smokeless tobacco: Never Used  . Alcohol use 0.0 oz/week     Comment: 1.5-3 oz daily in the past, minimal use in the last few months  . Drug use: No  . Sexual activity: Not on file   Other Topics Concern  . Not on file   Social History Narrative   Pt lives at home with his spouse.           Family History:  The patient's family history includes Allergies in his brother; Breast cancer in his mother; Cancer in his mother; Heart attack in his father; Heart disease in his brother, brother, father, and mother; Hyperlipidemia in his brother; Hypertension in his brother and mother; Other in his mother; Peripheral vascular disease in his brother; Stroke in his brother.   ROS:   Please see the history of present illness.    Review of Systems  Constitution: Positive for malaise/fatigue.  HENT: Negative.   Cardiovascular: Positive for leg swelling.  Respiratory: Negative.   Endocrine: Negative.   Hematologic/Lymphatic: Negative.   Musculoskeletal: Negative.   Gastrointestinal: Negative.   Genitourinary: Negative.   Neurological: Negative.    All other systems reviewed and are negative.   PHYSICAL EXAM:   VS:  BP (!) 148/78   Pulse 99   Ht 5\' 9"  (1.753 m)   Wt 135 lb 12.8 oz (61.6 kg)   SpO2 95% Comment: O2 tank   BMI 20.05 kg/m   Physical Exam  GEN: Elderly, on oxygen, in no acute distress  Neck: no JVD, carotid bruits, or masses Cardiac:RRR; no murmurs, rubs, or gallops  Respiratory:  clear to auscultation bilaterally, normal work of breathing GI: soft, nontender, nondistended, + BS Ext: Trace of edema left greater than right, decreased distal pulses MS: no deformity or atrophy  Skin: warm and dry, no rash Neuro:  Alert and Oriented x 3 Psych: euthymic mood, full affect  Wt Readings from Last 3 Encounters:  10/03/16 135  lb 12.8 oz (61.6 kg)  10/02/16 136 lb 0.4 oz (61.7 kg)  09/13/16 133 lb (60.3 kg)      Studies/Labs Reviewed:   EKG:  EKG is not ordered today.    Recent Labs: 07/06/2016: ALT 20; BUN 13; Creatinine, Ser 1.03; Potassium 4.1; Sodium 139   Lipid Panel    Component Value Date/Time   CHOL 135 09/13/2015 0320   TRIG 45 09/13/2015 0320   HDL 74 09/13/2015 0320   CHOLHDL 1.8 09/13/2015 0320   VLDL 9 09/13/2015 0320   LDLCALC 52 09/13/2015 0320    Additional studies/ records that were reviewed today include:   No recent cardiac studies   ASSESSMENT:    1. Orthostatic hypotension   2. Essential hypertension   3. Coronary artery disease involving coronary bypass graft of native heart with angina pectoris ([place])   4. Cor pulmonale, chronic (HCC)/ clinical dx       PLAN:  In order of problems listed above:  Orthostatic hypotension patient is not orthostatic in the office today but has had a long-standing problem secondary to autonomic dysfunction. He appears dehydrated today. He only drinks 24 ounces of water daily. Increase hydration to 1 L per day, compression stockings, check renal function, Florinef as needed. Follow-up with Dr. Tamala Julian  Essential hypertension continue with hydralazine as needed for spikes in blood pressure  CAD without angina  Cor pulmonale on O2 followed by Dr. [name]    Medication Adjustments/Labs and Tests Ordered: Current medicines  are reviewed at length with the patient today.  Concerns regarding medicines are outlined above.  Medication changes, Labs and Tests ordered today are listed in the Patient Instructions below. Patient Instructions  Medication Instructions:  Your physician recommends that you continue on your current medications as directed. Please refer to the Current Medication list given to you today.   Labwork: TODAY:  BMET  Testing/Procedures: None ordered  Follow-Up: Your physician recommends that you schedule a follow-up appointment in: [place]   Any Other Special Instructions Will Be Listed Below (If Applicable).  Make sure to drink at least 1 liter of water and/or gatorade daily  Wear your compression stockings   If you need a refill on your cardiac medications before your next appointment, please call your pharmacy.      Signed, Ermalinda Barrios, PA-C  10/03/2016 3:10 PM    [facility] [place], [place], [place]  [address] Phone: [phone]; Fax: [phone]

## 2016-10-04 ENCOUNTER — Other Ambulatory Visit: Payer: Self-pay | Admitting: Internal Medicine

## 2016-10-04 ENCOUNTER — Telehealth: Payer: Self-pay | Admitting: Endocrinology

## 2016-10-04 LAB — BASIC METABOLIC PANEL
BUN/Creatinine Ratio: 18 (ref 10–24)
BUN: 16 mg/dL (ref 8–27)
CO2: 28 mmol/L (ref 20–29)
Calcium: 9.5 mg/dL (ref 8.6–10.2)
Chloride: 98 mmol/L (ref 96–106)
Creatinine, Ser: 0.89 mg/dL (ref 0.76–1.27)
GFR calc Af Amer: 90 mL/min/{1.73_m2} (ref >59)
GFR calc non Af Amer: 78 mL/min/{1.73_m2} (ref >59)
Glucose: 105 mg/dL — ABNORMAL HIGH (ref 65–99)
Potassium: 4.1 mmol/L (ref 3.5–5.2)
Sodium: 142 mmol/L (ref 134–144)

## 2016-10-05 ENCOUNTER — Telehealth: Payer: Self-pay | Admitting: Physician Assistant

## 2016-10-05 ENCOUNTER — Encounter (HOSPITAL_COMMUNITY)
Admission: RE | Admit: 2016-10-05 | Discharge: 2016-10-05 | Disposition: A | Discharge status: Home / Self Care | Payer: Medicare Other | Source: Ambulatory Visit | Attending: Internal Medicine | Admitting: Internal Medicine

## 2016-10-05 ENCOUNTER — Inpatient Hospital Stay (HOSPITAL_COMMUNITY)
Admission: RE | Admit: 2016-10-05 | Discharge: 2016-10-05 | Disposition: A | Discharge status: Home / Self Care | Payer: Medicare Other | Source: Ambulatory Visit

## 2016-10-05 DIAGNOSIS — R0609 Other forms of dyspnea: Principal | ICD-10-CM

## 2016-10-05 DIAGNOSIS — R0600 Dyspnea, unspecified: Secondary | ICD-10-CM

## 2016-10-05 NOTE — Telephone Encounter (Signed)
-----   Message from Imogene Burn, PA-C sent at 10/04/2016  7:55 AM EDT ----- Labs look good

## 2016-10-05 NOTE — Telephone Encounter (Signed)
New message    Pt is calling about his labs results.

## 2016-10-05 NOTE — Telephone Encounter (Signed)
Returned pts call and we discussed his lab results. See result note. 

## 2016-10-05 NOTE — Progress Notes (Signed)
Pulmonary Individual Treatment Plan  Patient Details  Name: [name] MRN: [id number] Date of Birth: [birth year] Referring Provider:     Pulmonary Rehab Walk Test from 10/05/2016 in [place]  Referring Provider  Dr. [name]      Initial Encounter Date:    Pulmonary Rehab Walk Test from 10/05/2016 in [place]  Date  10/05/16  Referring Provider  Dr. [name]      Visit Diagnosis: Dyspnea on exertion  Patient's Home Medications on Admission:   Current Outpatient Prescriptions:  .  ALPRAZolam (XANAX) 0.25 MG tablet, Take 0.25 mg by mouth 2 (two) times daily as needed for anxiety. Takes 1/2 tablet two times daily., Disp: , Rfl:  .  aspirin 81 MG tablet, Take 81 mg by mouth daily., Disp: , Rfl:  .  atorvastatin (LIPITOR) 20 MG tablet, TAKE 1 TABLET BY MOUTH EVERY DAY, Disp: 90 tablet, Rfl: 2 .  Azelastine-Fluticasone (DYMISTA) 137-50 MCG/ACT SUSP, Place 1 spray into the nose 2 (two) times daily., Disp: 1 Bottle, Rfl: 1 .  b complex vitamins tablet, Take 1 tablet by mouth daily., Disp: , Rfl:  .  Cholecalciferol (VITAMIN D3) 2000 units TABS, Take 2,000 Units by mouth daily., Disp: , Rfl:  .  Cyanocobalamin (VITAMIN B 12 PO), Take by mouth., Disp: , Rfl:  .  finasteride (PROSCAR) 5 MG tablet, Take 5 mg by mouth every morning., Disp: , Rfl:  .  fludrocortisone (FLORINEF) 0.1 MG tablet, Take 0.1 mg by mouth daily as needed (BP < 100 STANDING)., Disp: , Rfl:  .  furosemide (LASIX) 20 MG tablet, Take 20 mg by mouth daily as needed., Disp: , Rfl:  .  guaiFENesin (MUCINEX) 600 MG 12 hr tablet, Take by mouth 2 (two) times daily as needed., Disp: , Rfl:  .  hydrALAZINE (APRESOLINE) 25 MG tablet, Take 1 tablet (25 mg total) by mouth every 12 (twelve) hours as needed (for BP >096 systolic or > 283 diastolic). If your BP is 662 systolic you may take one dose of the hydralazine that you have at home.  But would not do this more than twice a  day., Disp: , Rfl:  .  levalbuterol (XOPENEX) 0.63 MG/3ML nebulizer solution, Take 3 mLs (0.63 mg total) by nebulization every 4 (four) hours as needed for wheezing or shortness of breath., Disp: 300 mL, Rfl: 12 .  loratadine (CLARITIN) 10 MG tablet, Take 10 mg by mouth daily as needed for allergies., Disp: , Rfl:  .  NITROSTAT 0.4 MG SL tablet, DISSOLVE 1 TABLET UNDER THE TONGUE AS DIRECTED AND AS NEEDED FOR CHEST PAIN (Patient not taking: Reported on 10/02/2016), Disp: 25 tablet, Rfl: 5 .  OXYGEN, Take 2 L by mouth daily as needed. , Disp: , Rfl:  .  predniSONE (DELTASONE) 5 MG tablet, Take 2.5 mg by mouth 2 (two) times daily., Disp: , Rfl: 3 .  ranitidine (ZANTAC) 150 MG tablet, Take 150 mg by mouth 2 (two) times daily., Disp: , Rfl:  .  sertraline (ZOLOFT) 25 MG tablet, TK SS T PO FOR 1 WEEK THEN ONE T PO QD STARTING AFTER Merrilee Jansky, Disp: , Rfl: 6 .  SPIRIVA RESPIMAT 2.5 MCG/ACT AERS, INHALE 2 PUFFS BY MOUTH EVERY DAY, Disp: 4 g, Rfl: 11 .  SYMBICORT 160-4.5 MCG/ACT inhaler, INHALE 2 PUFFS BY MOUTH EVERY MORNING AND THEN ANOTHER 2 PUFFS ABOUT 12 HOURS LATER, Disp: 10.2 g, Rfl: 11 .  TOPROL XL 25  MG 24 hr tablet, TAKE 1 TABLET BY MOUTH DAILY, Disp: 30 tablet, Rfl: 11 .  XOPENEX HFA 45 MCG/ACT inhaler, INHALE 2 PUFFS BY MOUTH EVERY 4 HOURS AS NEEDED FOR WHEEZING OR SHORTNESS OF BREATH, Disp: 15 g, Rfl: 2 .  XOPENEX HFA 45 MCG/ACT inhaler, INHALE 2 PUFFS BY MOUTH EVERY 4 HOURS AS NEEDED FOR WHEEZING OR SHORTNESS OF BREATH, Disp: 15 g, Rfl: 11  Past Medical History: Past Medical History:  Diagnosis Date  . Asthma   . BPH (benign prostatic hyperplasia)    severe; s/p multiple biopsies  . CAD (coronary artery disease)   . Carotid artery occlusion   . Chronic rhinitis   . COPD (chronic obstructive pulmonary disease) (HCC)    2L [place] O2  . Hyperlipidemia   . Hypertension   . Peripheral vascular disease ([place])   . 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 Syncope and collapse   . Ureteral tumor 08/2015    had endoscopic procedure for evaluation, unable to reach for biopsy    Tobacco Use: History  Smoking Status  . Former Smoker  . Packs/day: 2.00  . Years: 33.00  . Types: Cigarettes  . Quit date: 01/09/1982  Smokeless Tobacco  . Never Used    Labs: Recent Review Flowsheet Data    Labs for ITP Cardiac and Pulmonary Rehab Latest Ref Rng & Units 02/23/2010 06/06/2013 06/08/2015 09/13/2015 02/18/2016   Cholestrol 0 - 200 mg/dL - 131 - 135 -   LDLCALC 0 - 99 mg/dL - 58 - 52 -   HDL >40 mg/dL - 62.40 - 74 -   Trlycerides <150 mg/dL - 55.0 - 45 -   Hemoglobin A1c <5.7 % 5.7 (NOTE)                                                                       According to the ADA Clinical Practice Recommendations for 2011, when HbA1c is used as a screening test:   >=6.5%   Diagnostic of Diabetes Mellitus           (if abnormal result is confirmed)  5.7-6.4%   Increased risk of developing Diabetes Mellitus  References:Diagnosis and Classification of Diabetes Mellitus,Diabetes ZOXW,9604,54(UJWJX 1):S62-S69 and Standards of Medical Care in         Diabetes - 2011,Diabetes Care,2011,34 (Suppl 1):S11-S61.(H) - - - -   PHART 7.350 - 7.450 - - 7.406 - 7.381   PCO2ART 32.0 - 48.0 mmHg - - 48.6(H) - 47.4   HCO3 20.0 - 28.0 mmol/L - - 29.9(H) - 27.4   TCO2 0 - 100 mmol/L - - 26.7 - -   O2SAT % - - 96.4 - 97.9      Capillary Blood Glucose: Lab Results  Component Value Date   GLUCAP 120 (H) 02/25/2010   GLUCAP 88 02/25/2010   GLUCAP 139 (H) 02/24/2010   GLUCAP 137 (H) 02/24/2010   GLUCAP 108 (H) 02/24/2010     Pulmonary Assessment Scores:     Pulmonary Assessment Scores    Row Name 10/04/16 1238 10/05/16 1644       ADL UCSD   ADL Phase Entry Entry    SOB Score total 74  -      CAT Score  CAT Score 20  Entry  -      mMRC Score   mMRC Score  - 3       Pulmonary Function Assessment:     Pulmonary Function Assessment - 10/02/16 1504      Breath   Bilateral Breath Sounds Decreased    Shortness of Breath Yes;Limiting activity      Exercise Target Goals: Date: 10/05/16  Exercise Program Goal: Individual exercise prescription set with THRR, safety & activity barriers. Participant demonstrates ability to understand and report RPE using BORG scale, to self-measure pulse accurately, and to acknowledge the importance of the exercise prescription.  Exercise Prescription Goal: Starting with aerobic activity 30 plus minutes a day, 3 days per week for initial exercise prescription. Provide home exercise prescription and guidelines that participant acknowledges understanding prior to discharge.  Activity Barriers & Risk Stratification:     Activity Barriers & Cardiac Risk Stratification - 10/02/16 1431      Activity Barriers & Cardiac Risk Stratification   Activity Barriers Deconditioning;Muscular Weakness;Shortness of Breath;Arthritis  left shoulder      6 Minute Walk:     6 Minute Walk    Row Name 10/05/16 1637         6 Minute Walk   Phase Initial     Distance 800 feet     Walk Time 6 minutes     # of Rest Breaks 0     MPH 1.51     METS 2.15     RPE 13     Perceived Dyspnea  3     Symptoms Yes (comment)     Comments leg fatigue     Resting HR 106 bpm     Resting BP 138/69     Resting Oxygen Saturation  95 %     Exercise Oxygen Saturation  during 6 min walk 91 %     Max Ex. HR 106 bpm     Max Ex. BP 162/69     2 Minute Post BP 152/71       Interval HR   1 Minute HR 96     2 Minute HR 101     3 Minute HR 101     4 Minute HR 100     5 Minute HR 102     6 Minute HR 102     2 Minute Post HR 98     Interval Heart Rate? Yes       Interval Oxygen   Interval Oxygen? Yes     Baseline Oxygen Saturation % 95 %     1 Minute Oxygen Saturation % 96 %     1 Minute Liters of Oxygen 2 L     2 Minute Oxygen Saturation % 93 %     2 Minute Liters of Oxygen 2 L     3 Minute Oxygen Saturation % 93 %     3 Minute Liters of Oxygen 2 L     4 Minute Oxygen  Saturation % 92 %     4 Minute Liters of Oxygen 2 L     5 Minute Oxygen Saturation % 91 %     5 Minute Liters of Oxygen 2 L     6 Minute Oxygen Saturation % 91 %     6 Minute Liters of Oxygen 2 L     2 Minute Post Oxygen Saturation % 93 %     2 Minute Post Liters of Oxygen 2 L  Oxygen Initial Assessment:     Oxygen Initial Assessment - 10/05/16 1643      Initial 6 min Walk   Oxygen Used Continuous;E-Tanks   Liters per minute 2     Program Oxygen Prescription   Program Oxygen Prescription Continuous;E-Tanks   Liters per minute 2      Oxygen Re-Evaluation:   Oxygen Discharge (Final Oxygen Re-Evaluation):   Initial Exercise Prescription:     Initial Exercise Prescription - 10/05/16 1600      Date of Initial Exercise RX and Referring Provider   Date 10/05/16   Referring Provider Dr. [name]     Oxygen   Oxygen Continuous   Liters 2     Recumbant Bike   Level 1   Minutes 17     NuStep   Level 1   Minutes 17   METs 1.2     Track   Laps 3   Minutes 17     Prescription Details   Frequency (times per week) 2   Duration Progress to 45 minutes of aerobic exercise without signs/symptoms of physical distress     Intensity   THRR 40-80% of Max Heartrate 54-108   Ratings of Perceived Exertion 11-13   Perceived Dyspnea 0-4     Progression   Progression Continue progressive overload as per policy without signs/symptoms or physical distress.     Resistance Training   Training Prescription Yes   Weight orange bands   Reps 10-15      Perform Capillary Blood Glucose checks as needed.  Exercise Prescription Changes:   Exercise Comments:   Exercise Goals and Review:     Exercise Goals    Row Name 10/02/16 1435             Exercise Goals   Increase Physical Activity Yes       Intervention Provide advice, education, support and counseling about physical activity/exercise needs.;Develop an individualized exercise prescription for aerobic and  resistive training based on initial evaluation findings, risk stratification, comorbidities and participant's personal goals.       Expected Outcomes Achievement of increased cardiorespiratory fitness and enhanced flexibility, muscular endurance and strength shown through measurements of functional capacity and personal statement of participant.       Increase Strength and Stamina Yes       Intervention Provide advice, education, support and counseling about physical activity/exercise needs.;Develop an individualized exercise prescription for aerobic and resistive training based on initial evaluation findings, risk stratification, comorbidities and participant's personal goals.       Expected Outcomes Achievement of increased cardiorespiratory fitness and enhanced flexibility, muscular endurance and strength shown through measurements of functional capacity and personal statement of participant.       Able to understand and use rate of perceived exertion (RPE) scale Yes       Intervention Provide education and explanation on how to use RPE scale       Expected Outcomes Short Term: Able to use RPE daily in rehab to express subjective intensity level;Long Term:  Able to use RPE to guide intensity level when exercising independently       Able to understand and use Dyspnea scale Yes       Intervention Provide education and explanation on how to use Dyspnea scale       Expected Outcomes Short Term: Able to use Dyspnea scale daily in rehab to express subjective sense of shortness of breath during exertion;Long Term: Able to use Dyspnea scale to guide intensity  level when exercising independently       Knowledge and understanding of Target Heart Rate Range (THRR) Yes       Intervention Provide education and explanation of THRR including how the numbers were predicted and where they are located for reference       Expected Outcomes Short Term: Able to state/look up THRR;Long Term: Able to use THRR to govern  intensity when exercising independently;Short Term: Able to use daily as guideline for intensity in rehab       Understanding of Exercise Prescription Yes       Intervention Provide education, explanation, and written materials on patient's individual exercise prescription       Expected Outcomes Short Term: Able to explain program exercise prescription;Long Term: Able to explain home exercise prescription to exercise independently          Exercise Goals Re-Evaluation :   Discharge Exercise Prescription (Final Exercise Prescription Changes):   Nutrition:  Target Goals: Understanding of nutrition guidelines, daily intake of sodium 1500mg , cholesterol 200mg , calories 30% from fat and 7% or less from saturated fats, daily to have 5 or more servings of fruits and vegetables.  Biometrics:    Nutrition Therapy Plan and Nutrition Goals:   Nutrition Discharge: Rate Your Plate Scores:   Nutrition Goals Re-Evaluation:   Nutrition Goals Discharge (Final Nutrition Goals Re-Evaluation):   Psychosocial: Target Goals: Acknowledge presence or absence of significant depression and/or stress, maximize coping skills, provide positive support system. Participant is able to verbalize types and ability to use techniques and skills needed for reducing stress and depression.  Initial Review & Psychosocial Screening:     Initial Psych Review & Screening - 10/02/16 1505      Initial Review   Current issues with Current Sleep Concerns;Current Depression     Family Dynamics   Good Support System? Yes     Barriers   Psychosocial barriers to participate in program There are no identifiable barriers or psychosocial needs.     Screening Interventions   Interventions Encouraged to exercise      Quality of Life Scores:   PHQ-9: Recent Review Flowsheet Data    Depression screen [facility] 2/9 10/02/2016 10/06/2015 12/26/2012   Decreased Interest 1 1 1    Down, Depressed, Hopeless 3 0 1   PHQ - 2  Score 4 1 2    Altered sleeping 3 - 0   Tired, decreased energy 3 - 3   Change in appetite 0 - -   Feeling bad or failure about yourself  1 - 0   Trouble concentrating 0 - 0   Moving slowly or fidgety/restless 0 - 0   Suicidal thoughts 0 - 0   PHQ-9 Score 11 - 5   Difficult doing work/chores Somewhat difficult - -     Interpretation of Total Score  Total Score Depression Severity:  1-4 = Minimal depression, 5-9 = Mild depression, 10-14 = Moderate depression, 15-19 = Moderately severe depression, 20-27 = Severe depression   Psychosocial Evaluation and Intervention:     Psychosocial Evaluation - 10/02/16 1505      Psychosocial Evaluation & Interventions   Interventions Encouraged to exercise with the program and follow exercise prescription   Expected Outcomes patient will remain free from psychosocial barriers to participation.   Continue Psychosocial Services  Follow up required by staff      Psychosocial Re-Evaluation:   Psychosocial Discharge (Final Psychosocial Re-Evaluation):   Education: Education Goals: Education classes will be provided on a weekly  basis, covering required topics. Participant will state understanding/return demonstration of topics presented.  Learning Barriers/Preferences:     Learning Barriers/Preferences - 10/02/16 1504      Learning Barriers/Preferences   Learning Barriers Hearing   Learning Preferences Written Material;Group Instruction;Individual Instruction;Verbal Instruction      Education Topics: Risk Factor Reduction:  -Group instruction that is supported by a PowerPoint presentation. Instructor discusses the definition of a risk factor, different risk factors for pulmonary disease, and how the heart and lungs work together.     Nutrition for Pulmonary Patient:  -Group instruction provided by PowerPoint slides, verbal discussion, and written materials to support subject matter. The instructor gives an explanation and review of  healthy diet recommendations, which includes a discussion on weight management, recommendations for fruit and vegetable consumption, as well as protein, fluid, caffeine, fiber, sodium, sugar, and alcohol. Tips for eating when patients are short of breath are discussed.   Pursed Lip Breathing:  -Group instruction that is supported by demonstration and informational handouts. Instructor discusses the benefits of pursed lip and diaphragmatic breathing and detailed demonstration on how to preform both.     Oxygen Safety:  -Group instruction provided by PowerPoint, verbal discussion, and written material to support subject matter. There is an overview of "What is Oxygen" and "Why do we need it".  Instructor also reviews how to create a safe environment for oxygen use, the importance of using oxygen as prescribed, and the risks of noncompliance. There is a brief discussion on traveling with oxygen and resources the patient may utilize.   Oxygen Equipment:  -Group instruction provided by [facility] Staff utilizing handouts, written materials, and equipment demonstrations.   Signs and Symptoms:  -Group instruction provided by written material and verbal discussion to support subject matter. Warning signs and symptoms of infection, stroke, and heart attack are reviewed and when to call the physician/911 reinforced. Tips for preventing the spread of infection discussed.   Advanced Directives:  -Group instruction provided by verbal instruction and written material to support subject matter. Instructor reviews Advanced Directive laws and proper instruction for filling out document.   Pulmonary Video:  -Group video education that reviews the importance of medication and oxygen compliance, exercise, good nutrition, pulmonary hygiene, and pursed lip and diaphragmatic breathing for the pulmonary patient.   Exercise for the Pulmonary Patient:  -Group instruction that is supported by a PowerPoint  presentation. Instructor discusses benefits of exercise, core components of exercise, frequency, duration, and intensity of an exercise routine, importance of utilizing pulse oximetry during exercise, safety while exercising, and options of places to exercise outside of rehab.     Pulmonary Medications:  -Verbally interactive group education provided by instructor with focus on inhaled medications and proper administration.   Anatomy and Physiology of the Respiratory System and Intimacy:  -Group instruction provided by PowerPoint, verbal discussion, and written material to support subject matter. Instructor reviews respiratory cycle and anatomical components of the respiratory system and their functions. Instructor also reviews differences in obstructive and restrictive respiratory diseases with examples of each. Intimacy, Sex, and Sexuality differences are reviewed with a discussion on how relationships can change when diagnosed with pulmonary disease. Common sexual concerns are reviewed.   MD DAY -A group question and answer session with a medical doctor that allows participants to ask questions that relate to their pulmonary disease state.   OTHER EDUCATION -Group or individual verbal, written, or video instructions that support the educational goals of the pulmonary rehab program.  Knowledge Questionnaire Score:     Knowledge Questionnaire Score - 10/04/16 1238      Knowledge Questionnaire Score   Pre Score 10/13      Core Components/Risk Factors/Patient Goals at Admission:     Personal Goals and Risk Factors at Admission - 10/02/16 1504      Core Components/Risk Factors/Patient Goals on Admission   Improve shortness of breath with ADL's Yes   Intervention Provide education, individualized exercise plan and daily activity instruction to help decrease symptoms of SOB with activities of daily living.   Expected Outcomes Short Term: Achieves a reduction of symptoms when  performing activities of daily living.   Develop more efficient breathing techniques such as purse lipped breathing and diaphragmatic breathing; and practicing self-pacing with activity Yes   Intervention Provide education, demonstration and support about specific breathing techniuqes utilized for more efficient breathing. Include techniques such as pursed lipped breathing, diaphragmatic breathing and self-pacing activity.   Expected Outcomes Short Term: Participant will be able to demonstrate and use breathing techniques as needed throughout daily activities.   Increase knowledge of respiratory medications and ability to use respiratory devices properly  Yes   Intervention Provide education and demonstration as needed of appropriate use of medications, inhalers, and oxygen therapy.   Expected Outcomes Short Term: Achieves understanding of medications use. Understands that oxygen is a medication prescribed by physician. Demonstrates appropriate use of inhaler and oxygen therapy.   Hypertension Yes   Intervention Provide education on lifestyle modifcations including regular physical activity/exercise, weight management, moderate sodium restriction and increased consumption of fresh fruit, vegetables, and low fat dairy, alcohol moderation, and smoking cessation.;Monitor prescription use compliance.   Expected Outcomes Short Term: Continued assessment and intervention until BP is < 140/48mm HG in hypertensive participants. < 130/16mm HG in hypertensive participants with diabetes, heart failure or chronic kidney disease.;Long Term: Maintenance of blood pressure at goal levels.      Core Components/Risk Factors/Patient Goals Review:    Core Components/Risk Factors/Patient Goals at Discharge (Final Review):    ITP Comments:   Comments:

## 2016-10-06 NOTE — Telephone Encounter (Signed)
I have refused this medication per Dr. Dwyane Dee.

## 2016-10-06 NOTE — Telephone Encounter (Signed)
This has been prescribed from his historical provider. Please advise.

## 2016-10-06 NOTE — Telephone Encounter (Signed)
This needs to be prescribed by his PCP, he is not coming back now

## 2016-10-12 ENCOUNTER — Encounter (HOSPITAL_COMMUNITY)
Admission: RE | Admit: 2016-10-12 | Discharge: 2016-10-12 | Disposition: A | Discharge status: Home / Self Care | Payer: Medicare Other | Source: Ambulatory Visit | Attending: Internal Medicine | Admitting: Internal Medicine

## 2016-10-12 VITALS — Wt 138.0 lb

## 2016-10-12 DIAGNOSIS — R0609 Other forms of dyspnea: Secondary | ICD-10-CM | POA: Diagnosis not present

## 2016-10-12 DIAGNOSIS — R0600 Dyspnea, unspecified: Secondary | ICD-10-CM

## 2016-10-12 NOTE — Progress Notes (Signed)
Daily Session Note  Patient Details  Name: [name] MRN: [id number] Date of Birth: 10/31/1929 Referring Provider:     Pulmonary Rehab Walk Test from 10/05/2016 in [place]  Referring Provider  Dr. [name]      Encounter Date: 10/12/2016  Check In:     Session Check In - 10/12/16 1341      Check-In   Location [facility]   Staff Present Rodney Langton, RN;Molly diVincenzo, MS, ACSM RCEP, Exercise Physiologist   Supervising physician immediately available to respond to emergencies [facility] immediately available   Physician(s) Dr. Zigmund Daniel   Medication changes reported     No   Fall or balance concerns reported    No   Tobacco Cessation No Change   Warm-up and Cool-down Performed as group-led instruction   Resistance Training Performed Yes   VAD Patient? No     Pain Assessment   Currently in Pain? No/denies   Multiple Pain Sites No      Capillary Blood Glucose: No results found for this or any previous visit (from the past 24 hour(s)).      Exercise Prescription Changes - 10/12/16 1600      Response to Exercise   Blood Pressure (Admit) 152/74   Blood Pressure (Exercise) 170/72   Blood Pressure (Exit) 158/80   Heart Rate (Admit) 85 bpm   Heart Rate (Exercise) 94 bpm   Heart Rate (Exit) 82 bpm   Oxygen Saturation (Admit) 97 %   Oxygen Saturation (Exercise) 94 %   Oxygen Saturation (Exit) 99 %   Rating of Perceived Exertion (Exercise) 13   Perceived Dyspnea (Exercise) 4   Duration Progress to 45 minutes of aerobic exercise without signs/symptoms of physical distress   Intensity Other (comment)  40-80% of HRR     Progression   Progression Continue to progress workloads to maintain intensity without signs/symptoms of physical distress.     Resistance Training   Training Prescription Yes   Weight orange bands   Reps 10-15   Time 10 Minutes     Oxygen   Oxygen Continuous   Liters 2     Recumbant Bike    Level 1   Minutes 17     Track   Laps 8   Minutes 17      History  Smoking Status  . Former Smoker  . Packs/day: 2.00  . Years: 33.00  . Types: Cigarettes  . Quit date: 01/09/1982  Smokeless Tobacco  . Never Used    Goals Met:  No report of cardiac concerns or symptoms Strength training completed today  Goals Unmet:  Not Applicable  Comments: Service time is from 1330 to 1515    Dr. Rush Farmer is Medical Director for Pulmonary Rehab at [facility].

## 2016-10-17 ENCOUNTER — Encounter (HOSPITAL_COMMUNITY)
Admission: RE | Admit: 2016-10-17 | Discharge: 2016-10-17 | Disposition: A | Discharge status: Home / Self Care | Payer: Medicare Other | Source: Ambulatory Visit | Attending: Internal Medicine | Admitting: Internal Medicine

## 2016-10-17 VITALS — Wt 137.8 lb

## 2016-10-17 DIAGNOSIS — R0600 Dyspnea, unspecified: Secondary | ICD-10-CM

## 2016-10-17 DIAGNOSIS — R0609 Other forms of dyspnea: Principal | ICD-10-CM

## 2016-10-17 NOTE — Progress Notes (Signed)
Daily Session Note  Patient Details  Name: [name] MRN: [id number] Date of Birth: 1929/05/28 Referring Provider:     Pulmonary Rehab Walk Test from 10/05/2016 in [place]  Referring Provider  Dr. [name]      Encounter Date: 10/17/2016  Check In:     Session Check In - 10/17/16 1353      Check-In   Location [facility]   Staff Present Su Hilt, MS, ACSM RCEP, Exercise Physiologist;Portia Rollene Rotunda, RN, BSN   Supervising physician immediately available to respond to emergencies [facility] immediately available   Physician(s) Dr. Zigmund Daniel   Medication changes reported     No   Fall or balance concerns reported    No   Tobacco Cessation No Change   Warm-up and Cool-down Performed as group-led instruction   Resistance Training Performed Yes   VAD Patient? No     Pain Assessment   Currently in Pain? No/denies   Multiple Pain Sites No      Capillary Blood Glucose: No results found for this or any previous visit (from the past 24 hour(s)).      Exercise Prescription Changes - 10/17/16 1600      Response to Exercise   Blood Pressure (Admit) 134/76   Blood Pressure (Exercise) 154/72   Blood Pressure (Exit) 120/70   Heart Rate (Admit) 92 bpm   Heart Rate (Exercise) 94 bpm   Heart Rate (Exit) 76 bpm   Oxygen Saturation (Admit) 91 %   Oxygen Saturation (Exercise) 94 %   Oxygen Saturation (Exit) 99 %   Rating of Perceived Exertion (Exercise) 15   Perceived Dyspnea (Exercise) 3   Duration Progress to 45 minutes of aerobic exercise without signs/symptoms of physical distress   Intensity THRR unchanged     Progression   Progression Continue to progress workloads to maintain intensity without signs/symptoms of physical distress.     Resistance Training   Training Prescription Yes   Weight orange bands   Reps 10-15   Time 10 Minutes     Oxygen   Oxygen Continuous   Liters 2     Recumbant Bike   Level 1    Minutes 17     Track   Laps 6   Minutes 17      History  Smoking Status  . Former Smoker  . Packs/day: 2.00  . Years: 33.00  . Types: Cigarettes  . Quit date: 01/09/1982  Smokeless Tobacco  . Never Used    Goals Met:  No report of cardiac concerns or symptoms Strength training completed today  Goals Unmet:  Not Applicable  Comments: Service time is from 1330 to 1530    Dr. Rush Farmer is Medical Director for Pulmonary Rehab at [facility].

## 2016-10-19 ENCOUNTER — Encounter (HOSPITAL_COMMUNITY)
Admission: RE | Admit: 2016-10-19 | Discharge: 2016-10-19 | Disposition: A | Discharge status: Home / Self Care | Payer: Medicare Other | Source: Ambulatory Visit | Attending: Internal Medicine | Admitting: Internal Medicine

## 2016-10-19 DIAGNOSIS — R0600 Dyspnea, unspecified: Secondary | ICD-10-CM

## 2016-10-19 DIAGNOSIS — R0609 Other forms of dyspnea: Principal | ICD-10-CM

## 2016-10-19 NOTE — Progress Notes (Signed)
[name] 83 y.o. male  DOB: 1929/06/29 MRN: [id number]           Nutrition Brief Note Dx: Dyspnea on Exertion Past Medical History:  Diagnosis Date  . Asthma   . BPH (benign prostatic hyperplasia)    severe; s/p multiple biopsies  . CAD (coronary artery disease)   . Carotid artery occlusion   . Chronic rhinitis   . COPD (chronic obstructive pulmonary disease) (HCC)    2L [place] O2  . Hyperlipidemia   . Hypertension   . Peripheral vascular disease (Minor)   . 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 Syncope and collapse   . Ureteral tumor 08/2015   had endoscopic procedure for evaluation, unable to reach for biopsy   Meds reviewed. Deltasone noted  Ht: Ht Readings from Last 1 Encounters:  10/03/16 5\' 9"  (1.753 m)     Wt:  Wt Readings from Last 3 Encounters:  10/17/16 137 lb 12.6 oz (62.5 kg)  10/12/16 138 lb 0.1 oz (62.6 kg)  10/03/16 135 lb 12.8 oz (61.6 kg)     BMI: 20.4    Current tobacco use? No  Labs:  Lipid Panel     Component Value Date/Time   CHOL 135 09/13/2015 0320   TRIG 45 09/13/2015 0320   HDL 74 09/13/2015 0320   CHOLHDL 1.8 09/13/2015 0320   VLDL 9 09/13/2015 0320   LDLCALC 52 09/13/2015 0320   Nutrition Diagnosis ? Food-and nutrition-related knowledge deficit related to lack of exposure to information as related to diagnosis of pulmonary disease ? Increased energy expenditure related to increased energy requirements to breath as evidenced by slow, chronic wt loss. (Pt has maintained his wt around 132-141 lb over the past year. The previous year pt wt maintained around 146-150 lb. Pt wt is down 13 lb over the past 2 years; 8.7% decrease).  Goal(s) 1. The pt will recognize symptoms that can interfere with adequate oral intake, such as shortness of breath, N/V, early satiety, fatigue, ability to secure and prepare food, taste and smell changes, chewing/swallowing difficulties, and/ or pain when eating. 2. Identify food quantities necessary to prevent further wt loss/  promote wt gain at graduation from pulmonary rehab. Plan:  Pt to attend Pulmonary Nutrition class Will provide client-centered nutrition education as part of interdisciplinary care.   Monitor and evaluate progress toward nutrition goal with team.  Monitor and Evaluate progress toward nutrition goal with team.   Derek Mound, M.Ed, RD, LDN, CDE 10/19/2016 2:24 PM

## 2016-10-24 ENCOUNTER — Encounter (HOSPITAL_COMMUNITY): Payer: Medicare Other

## 2016-10-24 DIAGNOSIS — R5382 Chronic fatigue, unspecified: Secondary | ICD-10-CM | POA: Diagnosis not present

## 2016-10-24 DIAGNOSIS — R229 Localized swelling, mass and lump, unspecified: Secondary | ICD-10-CM | POA: Diagnosis not present

## 2016-10-24 DIAGNOSIS — Z9189 Other specified personal risk factors, not elsewhere classified: Secondary | ICD-10-CM | POA: Diagnosis not present

## 2016-10-24 DIAGNOSIS — I509 Heart failure, unspecified: Secondary | ICD-10-CM | POA: Diagnosis not present

## 2016-10-24 DIAGNOSIS — R6889 Other general symptoms and signs: Secondary | ICD-10-CM | POA: Diagnosis not present

## 2016-10-24 DIAGNOSIS — J441 Chronic obstructive pulmonary disease with (acute) exacerbation: Secondary | ICD-10-CM | POA: Diagnosis not present

## 2016-10-24 DIAGNOSIS — M7989 Other specified soft tissue disorders: Secondary | ICD-10-CM | POA: Diagnosis not present

## 2016-10-24 DIAGNOSIS — M791 Myalgia, unspecified site: Secondary | ICD-10-CM | POA: Diagnosis not present

## 2016-10-26 ENCOUNTER — Encounter (HOSPITAL_COMMUNITY): Admission: RE | Admit: 2016-10-26 | Payer: Medicare Other | Source: Ambulatory Visit

## 2016-10-30 NOTE — Progress Notes (Signed)
Pulmonary Individual Treatment Plan  Patient Details  Name: [name] MRN: [id number] Date of Birth: [birth year] Referring Provider:     Pulmonary Rehab Walk Test from 10/05/2016 in [place]  Referring Provider  Dr. [name]      Initial Encounter Date:    Pulmonary Rehab Walk Test from 10/05/2016 in [place]  Date  10/05/16  Referring Provider  Dr. [name]      Visit Diagnosis: Dyspnea on exertion  Patient's Home Medications on Admission:   Current Outpatient Prescriptions:  .  ALPRAZolam (XANAX) 0.25 MG tablet, Take 0.25 mg by mouth 2 (two) times daily as needed for anxiety. Takes 1/2 tablet two times daily., Disp: , Rfl:  .  aspirin 81 MG tablet, Take 81 mg by mouth daily., Disp: , Rfl:  .  atorvastatin (LIPITOR) 20 MG tablet, TAKE 1 TABLET BY MOUTH EVERY DAY, Disp: 90 tablet, Rfl: 2 .  Azelastine-Fluticasone (DYMISTA) 137-50 MCG/ACT SUSP, Place 1 spray into the nose 2 (two) times daily., Disp: 1 Bottle, Rfl: 1 .  b complex vitamins tablet, Take 1 tablet by mouth daily., Disp: , Rfl:  .  Cholecalciferol (VITAMIN D3) 2000 units TABS, Take 2,000 Units by mouth daily., Disp: , Rfl:  .  Cyanocobalamin (VITAMIN B 12 PO), Take by mouth., Disp: , Rfl:  .  finasteride (PROSCAR) 5 MG tablet, Take 5 mg by mouth every morning., Disp: , Rfl:  .  fludrocortisone (FLORINEF) 0.1 MG tablet, Take 0.1 mg by mouth daily as needed (BP < 100 STANDING)., Disp: , Rfl:  .  furosemide (LASIX) 20 MG tablet, Take 20 mg by mouth daily as needed., Disp: , Rfl:  .  guaiFENesin (MUCINEX) 600 MG 12 hr tablet, Take by mouth 2 (two) times daily as needed., Disp: , Rfl:  .  hydrALAZINE (APRESOLINE) 25 MG tablet, Take 1 tablet (25 mg total) by mouth every 12 (twelve) hours as needed (for BP >096 systolic or > 283 diastolic). If your BP is 662 systolic you may take one dose of the hydralazine that you have at home.  But would not do this more than twice a  day., Disp: , Rfl:  .  levalbuterol (XOPENEX) 0.63 MG/3ML nebulizer solution, Take 3 mLs (0.63 mg total) by nebulization every 4 (four) hours as needed for wheezing or shortness of breath., Disp: 300 mL, Rfl: 12 .  loratadine (CLARITIN) 10 MG tablet, Take 10 mg by mouth daily as needed for allergies., Disp: , Rfl:  .  NITROSTAT 0.4 MG SL tablet, DISSOLVE 1 TABLET UNDER THE TONGUE AS DIRECTED AND AS NEEDED FOR CHEST PAIN (Patient not taking: Reported on 10/02/2016), Disp: 25 tablet, Rfl: 5 .  OXYGEN, Take 2 L by mouth daily as needed. , Disp: , Rfl:  .  predniSONE (DELTASONE) 5 MG tablet, Take 2.5 mg by mouth 2 (two) times daily., Disp: , Rfl: 3 .  ranitidine (ZANTAC) 150 MG tablet, Take 150 mg by mouth 2 (two) times daily., Disp: , Rfl:  .  sertraline (ZOLOFT) 25 MG tablet, TK SS T PO FOR 1 WEEK THEN ONE T PO QD STARTING AFTER Merrilee Jansky, Disp: , Rfl: 6 .  SPIRIVA RESPIMAT 2.5 MCG/ACT AERS, INHALE 2 PUFFS BY MOUTH EVERY DAY, Disp: 4 g, Rfl: 11 .  SYMBICORT 160-4.5 MCG/ACT inhaler, INHALE 2 PUFFS BY MOUTH EVERY MORNING AND THEN ANOTHER 2 PUFFS ABOUT 12 HOURS LATER, Disp: 10.2 g, Rfl: 11 .  TOPROL XL 25  MG 24 hr tablet, TAKE 1 TABLET BY MOUTH DAILY, Disp: 30 tablet, Rfl: 11 .  XOPENEX HFA 45 MCG/ACT inhaler, INHALE 2 PUFFS BY MOUTH EVERY 4 HOURS AS NEEDED FOR WHEEZING OR SHORTNESS OF BREATH, Disp: 15 g, Rfl: 2 .  XOPENEX HFA 45 MCG/ACT inhaler, INHALE 2 PUFFS BY MOUTH EVERY 4 HOURS AS NEEDED FOR WHEEZING OR SHORTNESS OF BREATH, Disp: 15 g, Rfl: 11  Past Medical History: Past Medical History:  Diagnosis Date  . Asthma   . BPH (benign prostatic hyperplasia)    severe; s/p multiple biopsies  . CAD (coronary artery disease)   . Carotid artery occlusion   . Chronic rhinitis   . COPD (chronic obstructive pulmonary disease) (HCC)    2L [place] O2  . Hyperlipidemia   . Hypertension   . Peripheral vascular disease ([place])   . 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 Syncope and collapse   . Ureteral tumor 08/2015    had endoscopic procedure for evaluation, unable to reach for biopsy    Tobacco Use: History  Smoking Status  . Former Smoker  . Packs/day: 2.00  . Years: 33.00  . Types: Cigarettes  . Quit date: 01/09/1982  Smokeless Tobacco  . Never Used    Labs: Recent Review Flowsheet Data    Labs for ITP Cardiac and Pulmonary Rehab Latest Ref Rng & Units 02/23/2010 06/06/2013 06/08/2015 09/13/2015 02/18/2016   Cholestrol 0 - 200 mg/dL - 131 - 135 -   LDLCALC 0 - 99 mg/dL - 58 - 52 -   HDL >40 mg/dL - 62.40 - 74 -   Trlycerides <150 mg/dL - 55.0 - 45 -   Hemoglobin A1c <5.7 % 5.7 (NOTE)                                                                       According to the ADA Clinical Practice Recommendations for 2011, when HbA1c is used as a screening test:   >=6.5%   Diagnostic of Diabetes Mellitus           (if abnormal result is confirmed)  5.7-6.4%   Increased risk of developing Diabetes Mellitus  References:Diagnosis and Classification of Diabetes Mellitus,Diabetes ZOXW,9604,54(UJWJX 1):S62-S69 and Standards of Medical Care in         Diabetes - 2011,Diabetes Care,2011,34 (Suppl 1):S11-S61.(H) - - - -   PHART 7.350 - 7.450 - - 7.406 - 7.381   PCO2ART 32.0 - 48.0 mmHg - - 48.6(H) - 47.4   HCO3 20.0 - 28.0 mmol/L - - 29.9(H) - 27.4   TCO2 0 - 100 mmol/L - - 26.7 - -   O2SAT % - - 96.4 - 97.9      Capillary Blood Glucose: Lab Results  Component Value Date   GLUCAP 120 (H) 02/25/2010   GLUCAP 88 02/25/2010   GLUCAP 139 (H) 02/24/2010   GLUCAP 137 (H) 02/24/2010   GLUCAP 108 (H) 02/24/2010     Pulmonary Assessment Scores:     Pulmonary Assessment Scores    Row Name 10/04/16 1238 10/05/16 1644       ADL UCSD   ADL Phase Entry Entry    SOB Score total 74  -      CAT Score  CAT Score 20  Entry  -      mMRC Score   mMRC Score  - 3       Pulmonary Function Assessment:     Pulmonary Function Assessment - 10/02/16 1504      Breath   Bilateral Breath Sounds Decreased    Shortness of Breath Yes;Limiting activity      Exercise Target Goals:    Exercise Program Goal: Individual exercise prescription set with THRR, safety & activity barriers. Participant demonstrates ability to understand and report RPE using BORG scale, to self-measure pulse accurately, and to acknowledge the importance of the exercise prescription.  Exercise Prescription Goal: Starting with aerobic activity 30 plus minutes a day, 3 days per week for initial exercise prescription. Provide home exercise prescription and guidelines that participant acknowledges understanding prior to discharge.  Activity Barriers & Risk Stratification:     Activity Barriers & Cardiac Risk Stratification - 10/02/16 1431      Activity Barriers & Cardiac Risk Stratification   Activity Barriers Deconditioning;Muscular Weakness;Shortness of Breath;Arthritis  left shoulder      6 Minute Walk:     6 Minute Walk    Row Name 10/05/16 1637         6 Minute Walk   Phase Initial     Distance 800 feet     Walk Time 6 minutes     # of Rest Breaks 0     MPH 1.51     METS 2.15     RPE 13     Perceived Dyspnea  3     Symptoms Yes (comment)     Comments leg fatigue     Resting HR 106 bpm     Resting BP 138/69     Resting Oxygen Saturation  95 %     Exercise Oxygen Saturation  during 6 min walk 91 %     Max Ex. HR 106 bpm     Max Ex. BP 162/69     2 Minute Post BP 152/71       Interval HR   1 Minute HR 96     2 Minute HR 101     3 Minute HR 101     4 Minute HR 100     5 Minute HR 102     6 Minute HR 102     2 Minute Post HR 98     Interval Heart Rate? Yes       Interval Oxygen   Interval Oxygen? Yes     Baseline Oxygen Saturation % 95 %     1 Minute Oxygen Saturation % 96 %     1 Minute Liters of Oxygen 2 L     2 Minute Oxygen Saturation % 93 %     2 Minute Liters of Oxygen 2 L     3 Minute Oxygen Saturation % 93 %     3 Minute Liters of Oxygen 2 L     4 Minute Oxygen Saturation % 92 %      4 Minute Liters of Oxygen 2 L     5 Minute Oxygen Saturation % 91 %     5 Minute Liters of Oxygen 2 L     6 Minute Oxygen Saturation % 91 %     6 Minute Liters of Oxygen 2 L     2 Minute Post Oxygen Saturation % 93 %     2 Minute Post Liters of Oxygen 2 L  Oxygen Initial Assessment:     Oxygen Initial Assessment - 10/05/16 1643      Initial 6 min Walk   Oxygen Used Continuous;E-Tanks   Liters per minute 2     Program Oxygen Prescription   Program Oxygen Prescription Continuous;E-Tanks   Liters per minute 2      Oxygen Re-Evaluation:     Oxygen Re-Evaluation    Row Name 10/30/16 0755             Program Oxygen Prescription   Program Oxygen Prescription Continuous;E-Tanks       Liters per minute 2         Home Oxygen   Sleep Oxygen Prescription Continuous       Liters per minute 2       Home Exercise Oxygen Prescription Continuous       Liters per minute 2       Home at Rest Exercise Oxygen Prescription Continuous       Liters per minute 2       Compliance with Home Oxygen Use Yes         Goals/Expected Outcomes   Short Term Goals To learn and exhibit compliance with exercise, home and travel O2 prescription;To learn and understand importance of monitoring SPO2 with pulse oximeter and demonstrate accurate use of the pulse oximeter.;To learn and demonstrate proper pursed lip breathing techniques or other breathing techniques.;To learn and understand importance of maintaining oxygen saturations>88%;To learn and demonstrate proper use of respiratory medications       Long  Term Goals Exhibits compliance with exercise, home and travel O2 prescription;Maintenance of O2 saturations>88%;Compliance with respiratory medication;Verbalizes importance of monitoring SPO2 with pulse oximeter and return demonstration;Exhibits proper breathing techniques, such as pursed lip breathing or other method taught during program session;Demonstrates proper use of MDI's        Comments patient compliant with home O2 use. observed using his pulse oximeter to monitor his SPO2. He uses PLB without cueing          Oxygen Discharge (Final Oxygen Re-Evaluation):     Oxygen Re-Evaluation - 10/30/16 0755      Program Oxygen Prescription   Program Oxygen Prescription Continuous;E-Tanks   Liters per minute 2     Home Oxygen   Sleep Oxygen Prescription Continuous   Liters per minute 2   Home Exercise Oxygen Prescription Continuous   Liters per minute 2   Home at Rest Exercise Oxygen Prescription Continuous   Liters per minute 2   Compliance with Home Oxygen Use Yes     Goals/Expected Outcomes   Short Term Goals To learn and exhibit compliance with exercise, home and travel O2 prescription;To learn and understand importance of monitoring SPO2 with pulse oximeter and demonstrate accurate use of the pulse oximeter.;To learn and demonstrate proper pursed lip breathing techniques or other breathing techniques.;To learn and understand importance of maintaining oxygen saturations>88%;To learn and demonstrate proper use of respiratory medications   Long  Term Goals Exhibits compliance with exercise, home and travel O2 prescription;Maintenance of O2 saturations>88%;Compliance with respiratory medication;Verbalizes importance of monitoring SPO2 with pulse oximeter and return demonstration;Exhibits proper breathing techniques, such as pursed lip breathing or other method taught during program session;Demonstrates proper use of MDI's   Comments patient compliant with home O2 use. observed using his pulse oximeter to monitor his SPO2. He uses PLB without cueing      Initial Exercise Prescription:     Initial Exercise Prescription - 10/05/16 1600  Date of Initial Exercise RX and Referring Provider   Date 10/05/16   Referring Provider Dr. [name]     Oxygen   Oxygen Continuous   Liters 2     Recumbant Bike   Level 1   Minutes 17     NuStep   Level 1   Minutes 17    METs 1.2     Track   Laps 3   Minutes 17     Prescription Details   Frequency (times per week) 2   Duration Progress to 45 minutes of aerobic exercise without signs/symptoms of physical distress     Intensity   THRR 40-80% of Max Heartrate 54-108   Ratings of Perceived Exertion 11-13   Perceived Dyspnea 0-4     Progression   Progression Continue progressive overload as per policy without signs/symptoms or physical distress.     Resistance Training   Training Prescription Yes   Weight orange bands   Reps 10-15      Perform Capillary Blood Glucose checks as needed.  Exercise Prescription Changes:     Exercise Prescription Changes    Row Name 10/12/16 1600 10/17/16 1600           Response to Exercise   Blood Pressure (Admit) 152/74 134/76      Blood Pressure (Exercise) 170/72 154/72      Blood Pressure (Exit) 158/80 120/70      Heart Rate (Admit) 85 bpm 92 bpm      Heart Rate (Exercise) 94 bpm 94 bpm      Heart Rate (Exit) 82 bpm 76 bpm      Oxygen Saturation (Admit) 97 % 91 %      Oxygen Saturation (Exercise) 94 % 94 %      Oxygen Saturation (Exit) 99 % 99 %      Rating of Perceived Exertion (Exercise) 13 15      Perceived Dyspnea (Exercise) 4 3      Duration Progress to 45 minutes of aerobic exercise without signs/symptoms of physical distress Progress to 45 minutes of aerobic exercise without signs/symptoms of physical distress      Intensity Other (comment)  40-80% of HRR THRR unchanged        Progression   Progression Continue to progress workloads to maintain intensity without signs/symptoms of physical distress. Continue to progress workloads to maintain intensity without signs/symptoms of physical distress.        Resistance Training   Training Prescription Yes Yes      Weight orange bands orange bands      Reps 10-15 10-15      Time 10 Minutes 10 Minutes        Oxygen   Oxygen Continuous Continuous      Liters 2 2        Recumbant Bike    Level 1 1      Minutes 17 17        Track   Laps 8 6      Minutes 17 17         Exercise Comments:   Exercise Goals and Review:     Exercise Goals    Row Name 10/02/16 1435             Exercise Goals   Increase Physical Activity Yes       Intervention Provide advice, education, support and counseling about physical activity/exercise needs.;Develop an individualized exercise prescription for aerobic and resistive training based on initial evaluation  findings, risk stratification, comorbidities and participant's personal goals.       Expected Outcomes Achievement of increased cardiorespiratory fitness and enhanced flexibility, muscular endurance and strength shown through measurements of functional capacity and personal statement of participant.       Increase Strength and Stamina Yes       Intervention Provide advice, education, support and counseling about physical activity/exercise needs.;Develop an individualized exercise prescription for aerobic and resistive training based on initial evaluation findings, risk stratification, comorbidities and participant's personal goals.       Expected Outcomes Achievement of increased cardiorespiratory fitness and enhanced flexibility, muscular endurance and strength shown through measurements of functional capacity and personal statement of participant.       Able to understand and use rate of perceived exertion (RPE) scale Yes       Intervention Provide education and explanation on how to use RPE scale       Expected Outcomes Short Term: Able to use RPE daily in rehab to express subjective intensity level;Long Term:  Able to use RPE to guide intensity level when exercising independently       Able to understand and use Dyspnea scale Yes       Intervention Provide education and explanation on how to use Dyspnea scale       Expected Outcomes Short Term: Able to use Dyspnea scale daily in rehab to express subjective sense of shortness of breath  during exertion;Long Term: Able to use Dyspnea scale to guide intensity level when exercising independently       Knowledge and understanding of Target Heart Rate Range (THRR) Yes       Intervention Provide education and explanation of THRR including how the numbers were predicted and where they are located for reference       Expected Outcomes Short Term: Able to state/look up THRR;Long Term: Able to use THRR to govern intensity when exercising independently;Short Term: Able to use daily as guideline for intensity in rehab       Understanding of Exercise Prescription Yes       Intervention Provide education, explanation, and written materials on patient's individual exercise prescription       Expected Outcomes Short Term: Able to explain program exercise prescription;Long Term: Able to explain home exercise prescription to exercise independently          Exercise Goals Re-Evaluation :     Exercise Goals Re-Evaluation    Row Name 10/28/16 1340             Exercise Goal Re-Evaluation   Exercise Goals Review Increase Physical Activity;Increase Strength and Stamina;Able to understand and use rate of perceived exertion (RPE) scale;Knowledge and understanding of Target Heart Rate Range (THRR);Understanding of Exercise Prescription;Able to understand and use Dyspnea scale       Comments Patient has only attended two exercise sessions. Will cont. to monitor and progress as able.        Expected Outcomes Through exercising at rehab and at home, patient will increase physical strength and stamina and find ADL's easier to perform.           Discharge Exercise Prescription (Final Exercise Prescription Changes):     Exercise Prescription Changes - 10/17/16 1600      Response to Exercise   Blood Pressure (Admit) 134/76   Blood Pressure (Exercise) 154/72   Blood Pressure (Exit) 120/70   Heart Rate (Admit) 92 bpm   Heart Rate (Exercise) 94 bpm   Heart Rate (Exit) 76  bpm   Oxygen Saturation  (Admit) 91 %   Oxygen Saturation (Exercise) 94 %   Oxygen Saturation (Exit) 99 %   Rating of Perceived Exertion (Exercise) 15   Perceived Dyspnea (Exercise) 3   Duration Progress to 45 minutes of aerobic exercise without signs/symptoms of physical distress   Intensity THRR unchanged     Progression   Progression Continue to progress workloads to maintain intensity without signs/symptoms of physical distress.     Resistance Training   Training Prescription Yes   Weight orange bands   Reps 10-15   Time 10 Minutes     Oxygen   Oxygen Continuous   Liters 2     Recumbant Bike   Level 1   Minutes 17     Track   Laps 6   Minutes 17      Nutrition:  Target Goals: Understanding of nutrition guidelines, daily intake of sodium <1577m, cholesterol <2074m calories 30% from fat and 7% or less from saturated fats, daily to have 5 or more servings of fruits and vegetables.  Biometrics:    Nutrition Therapy Plan and Nutrition Goals:     Nutrition Therapy & Goals - 10/19/16 1435      Nutrition Therapy   Diet High Calorie, High Protein      Personal Nutrition Goals   Nutrition Goal The pt will recognize symptoms that can interfere with adequate oral intake, such as shortness of breath, N/V, early satiety, fatigue, ability to secure and prepare food, taste and smell changes, chewing/swallowing difficulties, and/ or pain when eating.   Personal Goal #2 Identify food quantities necessary to prevent further wt loss/ promote wt gain at graduation from pulmonary rehab.     Intervention Plan   Intervention Prescribe, educate and counsel regarding individualized specific dietary modifications aiming towards targeted core components such as weight, hypertension, lipid management, diabetes, heart failure and other comorbidities.   Expected Outcomes Short Term Goal: Understand basic principles of dietary content, such as calories, fat, sodium, cholesterol and nutrients.;Long Term Goal:  Adherence to prescribed nutrition plan.      Nutrition Discharge: Rate Your Plate Scores:     Nutrition Assessments - 10/06/16 1124      Rate Your Plate Scores   Post Score 58      Nutrition Goals Re-Evaluation:   Nutrition Goals Discharge (Final Nutrition Goals Re-Evaluation):   Psychosocial: Target Goals: Acknowledge presence or absence of significant depression and/or stress, maximize coping skills, provide positive support system. Participant is able to verbalize types and ability to use techniques and skills needed for reducing stress and depression.  Initial Review & Psychosocial Screening:     Initial Psych Review & Screening - 10/02/16 1505      Initial Review   Current issues with Current Sleep Concerns;Current Depression     Family Dynamics   Good Support System? Yes     Barriers   Psychosocial barriers to participate in program There are no identifiable barriers or psychosocial needs.     Screening Interventions   Interventions Encouraged to exercise      Quality of Life Scores:   PHQ-9: Recent Review Flowsheet Data    [facility]2016 10/06/2015 12/26/2012   Decreased Interest 1 1 1    Down, Depressed, Hopeless 3 0 1   PHQ - 2 Score 4 1 2    Altered sleeping 3 - 0   Tired, decreased energy 3 - 3   Change in appetite 0 - -  Feeling bad or failure about yourself  1 - 0   Trouble concentrating 0 - 0   Moving slowly or fidgety/restless 0 - 0   Suicidal thoughts 0 - 0   PHQ-9 Score 11 - 5   Difficult doing work/chores Somewhat difficult - -     Interpretation of Total Score  Total Score Depression Severity:  1-4 = Minimal depression, 5-9 = Mild depression, 10-14 = Moderate depression, 15-19 = Moderately severe depression, 20-27 = Severe depression   Psychosocial Evaluation and Intervention:     Psychosocial Evaluation - 10/02/16 1505      Psychosocial Evaluation & Interventions   Interventions Encouraged to exercise with  the program and follow exercise prescription   Expected Outcomes patient will remain free from psychosocial barriers to participation.   Continue Psychosocial Services  Follow up required by staff      Psychosocial Re-Evaluation:     Psychosocial Re-Evaluation    [place] Name 10/30/16 0804             Psychosocial Re-Evaluation   Current issues with Current Sleep Concerns;Current Depression       Comments patient continues to struggle with depression over his current illness       Expected Outcomes psychosocial barriers will not effect patients ability to participate in pulmonary rehab       Continue Psychosocial Services  Follow up required by staff          Psychosocial Discharge (Final Psychosocial Re-Evaluation):     Psychosocial Re-Evaluation - 10/30/16 0804      Psychosocial Re-Evaluation   Current issues with Current Sleep Concerns;Current Depression   Comments patient continues to struggle with depression over his current illness   Expected Outcomes psychosocial barriers will not effect patients ability to participate in pulmonary rehab   Continue Psychosocial Services  Follow up required by staff      Education: Education Goals: Education classes will be provided on a weekly basis, covering required topics. Participant will state understanding/return demonstration of topics presented.  Learning Barriers/Preferences:     Learning Barriers/Preferences - 10/02/16 1504      Learning Barriers/Preferences   Learning Barriers Hearing   Learning Preferences Written Material;Group Instruction;Individual Instruction;Verbal Instruction      Education Topics: Risk Factor Reduction:  -Group instruction that is supported by a PowerPoint presentation. Instructor discusses the definition of a risk factor, different risk factors for pulmonary disease, and how the heart and lungs work together.     Nutrition for Pulmonary Patient:  -Group instruction provided by PowerPoint  slides, verbal discussion, and written materials to support subject matter. The instructor gives an explanation and review of healthy diet recommendations, which includes a discussion on weight management, recommendations for fruit and vegetable consumption, as well as protein, fluid, caffeine, fiber, sodium, sugar, and alcohol. Tips for eating when patients are short of breath are discussed.   Pursed Lip Breathing:  -Group instruction that is supported by demonstration and informational handouts. Instructor discusses the benefits of pursed lip and diaphragmatic breathing and detailed demonstration on how to preform both.     Oxygen Safety:  -Group instruction provided by PowerPoint, verbal discussion, and written material to support subject matter. There is an overview of "What is Oxygen" and "Why do we need it".  Instructor also reviews how to create a safe environment for oxygen use, the importance of using oxygen as prescribed, and the risks of noncompliance. There is a brief discussion on traveling with oxygen and resources the  patient may utilize.   Oxygen Equipment:  -Group instruction provided by [facility] Staff utilizing handouts, written materials, and equipment demonstrations.   Signs and Symptoms:  -Group instruction provided by written material and verbal discussion to support subject matter. Warning signs and symptoms of infection, stroke, and heart attack are reviewed and when to call the physician/911 reinforced. Tips for preventing the spread of infection discussed.   Advanced Directives:  -Group instruction provided by verbal instruction and written material to support subject matter. Instructor reviews Advanced Directive laws and proper instruction for filling out document.   Pulmonary Video:  -Group video education that reviews the importance of medication and oxygen compliance, exercise, good nutrition, pulmonary hygiene, and pursed lip and diaphragmatic breathing for  the pulmonary patient.   Exercise for the Pulmonary Patient:  -Group instruction that is supported by a PowerPoint presentation. Instructor discusses benefits of exercise, core components of exercise, frequency, duration, and intensity of an exercise routine, importance of utilizing pulse oximetry during exercise, safety while exercising, and options of places to exercise outside of rehab.     Pulmonary Medications:  -Verbally interactive group education provided by instructor with focus on inhaled medications and proper administration.   Anatomy and Physiology of the Respiratory System and Intimacy:  -Group instruction provided by PowerPoint, verbal discussion, and written material to support subject matter. Instructor reviews respiratory cycle and anatomical components of the respiratory system and their functions. Instructor also reviews differences in obstructive and restrictive respiratory diseases with examples of each. Intimacy, Sex, and Sexuality differences are reviewed with a discussion on how relationships can change when diagnosed with pulmonary disease. Common sexual concerns are reviewed.   MD DAY -A group question and answer session with a medical doctor that allows participants to ask questions that relate to their pulmonary disease state.   PULMONARY REHAB OTHER RESPIRATORY from 10/17/2016 in [place]  Date  10/17/16  Educator  yacoub  Instruction Review Code  2- meets goals/outcomes      OTHER EDUCATION -Group or individual verbal, written, or video instructions that support the educational goals of the pulmonary rehab program.   Knowledge Questionnaire Score:     Knowledge Questionnaire Score - 10/04/16 1238      Knowledge Questionnaire Score   Pre Score 10/13      Core Components/Risk Factors/Patient Goals at Admission:     Personal Goals and Risk Factors at Admission - 10/02/16 1504      Core Components/Risk  Factors/Patient Goals on Admission   Improve shortness of breath with ADL's Yes   Intervention Provide education, individualized exercise plan and daily activity instruction to help decrease symptoms of SOB with activities of daily living.   Expected Outcomes Short Term: Achieves a reduction of symptoms when performing activities of daily living.   Develop more efficient breathing techniques such as purse lipped breathing and diaphragmatic breathing; and practicing self-pacing with activity Yes   Intervention Provide education, demonstration and support about specific breathing techniuqes utilized for more efficient breathing. Include techniques such as pursed lipped breathing, diaphragmatic breathing and self-pacing activity.   Expected Outcomes Short Term: Participant will be able to demonstrate and use breathing techniques as needed throughout daily activities.   Increase knowledge of respiratory medications and ability to use respiratory devices properly  Yes   Intervention Provide education and demonstration as needed of appropriate use of medications, inhalers, and oxygen therapy.   Expected Outcomes Short Term: Achieves understanding of medications use. Understands that  oxygen is a medication prescribed by physician. Demonstrates appropriate use of inhaler and oxygen therapy.   Hypertension Yes   Intervention Provide education on lifestyle modifcations including regular physical activity/exercise, weight management, moderate sodium restriction and increased consumption of fresh fruit, vegetables, and low fat dairy, alcohol moderation, and smoking cessation.;Monitor prescription use compliance.   Expected Outcomes Short Term: Continued assessment and intervention until BP is < 140/50m HG in hypertensive participants. < 130/893mHG in hypertensive participants with diabetes, heart failure or chronic kidney disease.;Long Term: Maintenance of blood pressure at goal levels.      Core  Components/Risk Factors/Patient Goals Review:      Goals and Risk Factor Review    Row Name 10/30/16 0756             Core Components/Risk Factors/Patient Goals Review   Personal Goals Review Develop more efficient breathing techniques such as purse lipped breathing and diaphragmatic breathing and practicing self-pacing with activity.;Improve shortness of breath with ADL's;Increase knowledge of respiratory medications and ability to use respiratory devices properly.;Hypertension       Review patient has only attended 2 exercise session since admission. at his last appointment he was extremely hypertensive. he voiced that he forgot to take his PRN hypertensive medicine. reinforced the importance of medication compliance. it is too soon to evaluate progress towards other pulmonary rehab goals          Core Components/Risk Factors/Patient Goals at Discharge (Final Review):      Goals and Risk Factor Review - 10/30/16 0756      Core Components/Risk Factors/Patient Goals Review   Personal Goals Review Develop more efficient breathing techniques such as purse lipped breathing and diaphragmatic breathing and practicing self-pacing with activity.;Improve shortness of breath with ADL's;Increase knowledge of respiratory medications and ability to use respiratory devices properly.;Hypertension   Review patient has only attended 2 exercise session since admission. at his last appointment he was extremely hypertensive. he voiced that he forgot to take his PRN hypertensive medicine. reinforced the importance of medication compliance. it is too soon to evaluate progress towards other pulmonary rehab goals      ITP Comments:   Comments: ITP REVIEW  Recommend continued exercise, life style modification, education, and utilization of breathing techniques to increase stamina and strength and decrease shortness of breath with exertion. Expect to see progress towards pulmonary rehab goals over the next 30  days.

## 2016-10-31 ENCOUNTER — Ambulatory Visit
Admission: RE | Admit: 2016-10-31 | Discharge: 2016-10-31 | Disposition: A | Discharge status: Home / Self Care | Payer: Medicare Other | Source: Ambulatory Visit | Attending: Cardiology | Admitting: Cardiology

## 2016-10-31 ENCOUNTER — Other Ambulatory Visit: Payer: Self-pay | Admitting: Cardiology

## 2016-10-31 ENCOUNTER — Encounter (HOSPITAL_COMMUNITY)
Admission: RE | Admit: 2016-10-31 | Discharge: 2016-10-31 | Disposition: A | Discharge status: Home / Self Care | Payer: Medicare Other | Source: Ambulatory Visit | Attending: Internal Medicine | Admitting: Internal Medicine

## 2016-10-31 DIAGNOSIS — R0600 Dyspnea, unspecified: Secondary | ICD-10-CM | POA: Diagnosis not present

## 2016-10-31 DIAGNOSIS — I4949 Other premature depolarization: Secondary | ICD-10-CM | POA: Diagnosis not present

## 2016-10-31 DIAGNOSIS — I6523 Occlusion and stenosis of bilateral carotid arteries: Secondary | ICD-10-CM | POA: Diagnosis not present

## 2016-10-31 DIAGNOSIS — R0609 Other forms of dyspnea: Principal | ICD-10-CM

## 2016-10-31 DIAGNOSIS — E785 Hyperlipidemia, unspecified: Secondary | ICD-10-CM | POA: Diagnosis not present

## 2016-10-31 DIAGNOSIS — J449 Chronic obstructive pulmonary disease, unspecified: Secondary | ICD-10-CM | POA: Diagnosis not present

## 2016-10-31 DIAGNOSIS — I119 Hypertensive heart disease without heart failure: Secondary | ICD-10-CM | POA: Diagnosis not present

## 2016-10-31 DIAGNOSIS — I872 Venous insufficiency (chronic) (peripheral): Secondary | ICD-10-CM | POA: Diagnosis not present

## 2016-10-31 DIAGNOSIS — I951 Orthostatic hypotension: Secondary | ICD-10-CM | POA: Diagnosis not present

## 2016-10-31 DIAGNOSIS — I2510 Atherosclerotic heart disease of native coronary artery without angina pectoris: Secondary | ICD-10-CM | POA: Diagnosis not present

## 2016-10-31 DIAGNOSIS — I2781 Cor pulmonale (chronic): Secondary | ICD-10-CM | POA: Diagnosis not present

## 2016-10-31 DIAGNOSIS — I739 Peripheral vascular disease, unspecified: Secondary | ICD-10-CM | POA: Diagnosis not present

## 2016-11-02 ENCOUNTER — Encounter (HOSPITAL_COMMUNITY)
Admission: RE | Admit: 2016-11-02 | Discharge: 2016-11-02 | Disposition: A | Discharge status: Home / Self Care | Payer: Medicare Other | Source: Ambulatory Visit | Attending: Internal Medicine | Admitting: Internal Medicine

## 2016-11-02 VITALS — Wt 136.0 lb

## 2016-11-02 DIAGNOSIS — R0609 Other forms of dyspnea: Secondary | ICD-10-CM | POA: Diagnosis not present

## 2016-11-02 DIAGNOSIS — R0600 Dyspnea, unspecified: Secondary | ICD-10-CM

## 2016-11-02 NOTE — Progress Notes (Signed)
Daily Session Note  Patient Details  Name: PAYTON PRINSEN MRN: [id number] Date of Birth: 1929/12/23 Referring Provider:     Pulmonary Rehab Walk Test from 10/05/2016 in [facility]  Referring Provider  Dr. [name]      Encounter Date: 11/02/2016  Check In:     Session Check In - 11/02/16 1330      Check-In   Location [facility]   Staff Present Rosebud Poles, RN, BSN;Josejuan Hoaglin, MS, ACSM RCEP, Exercise Physiologist;Portia Rollene Rotunda, RN, Roque Cash, RN   Supervising physician immediately available to respond to emergencies [facility] immediately available   Physician(s) Dr. Wendee Beavers   Medication changes reported     No   Fall or balance concerns reported    No   Tobacco Cessation No Change   Warm-up and Cool-down Performed as group-led instruction   Resistance Training Performed Yes   VAD Patient? No     Pain Assessment   Currently in Pain? No/denies   Multiple Pain Sites No      Capillary Blood Glucose: No results found for this or any previous visit (from the past 24 hour(s)).      Exercise Prescription Changes - 11/02/16 1500      Response to Exercise   Blood Pressure (Admit) 128/56   Blood Pressure (Exercise) 142/76   Blood Pressure (Exit) 126/64   Heart Rate (Admit) 75 bpm   Heart Rate (Exercise) 88 bpm   Heart Rate (Exit) 85 bpm   Oxygen Saturation (Admit) 94 %   Oxygen Saturation (Exercise) 97 %   Oxygen Saturation (Exit) 99 %   Rating of Perceived Exertion (Exercise) 13   Perceived Dyspnea (Exercise) 3   Duration Progress to 45 minutes of aerobic exercise without signs/symptoms of physical distress   Intensity THRR unchanged     Progression   Progression Continue to progress workloads to maintain intensity without signs/symptoms of physical distress.     Resistance Training   Training Prescription Yes   Weight orange bands   Reps 10-15   Time 10 Minutes     Oxygen   Oxygen Continuous   Liters  2     NuStep   Level 1   Minutes 17   METs 1.4     Track   Laps 7   Minutes 17      History  Smoking Status  . Former Smoker  . Packs/day: 2.00  . Years: 33.00  . Types: Cigarettes  . Quit date: 01/09/1982  Smokeless Tobacco  . Never Used    Goals Met:  Exercise tolerated well No report of cardiac concerns or symptoms Strength training completed today  Goals Unmet:  Not Applicable  Comments: Service time is from 1:30p to 3:15p    Dr. Rush Farmer is Medical Director for Pulmonary Rehab at [facility]

## 2016-11-06 ENCOUNTER — Telehealth: Payer: Self-pay | Admitting: Internal Medicine

## 2016-11-06 ENCOUNTER — Other Ambulatory Visit: Payer: Self-pay | Admitting: Adult Health

## 2016-11-06 ENCOUNTER — Ambulatory Visit: Payer: Medicare Other

## 2016-11-06 DIAGNOSIS — R350 Frequency of micturition: Secondary | ICD-10-CM | POA: Diagnosis not present

## 2016-11-06 DIAGNOSIS — C669 Malignant neoplasm of unspecified ureter: Secondary | ICD-10-CM | POA: Diagnosis not present

## 2016-11-06 DIAGNOSIS — N401 Enlarged prostate with lower urinary tract symptoms: Secondary | ICD-10-CM | POA: Diagnosis not present

## 2016-11-06 NOTE — Telephone Encounter (Signed)
Spoke with the pt  He is c/o weakness and increased SOB  OV with MW at 1:45 pm tomorrow  ED sooner If needed

## 2016-11-07 ENCOUNTER — Ambulatory Visit: Payer: Medicare Other

## 2016-11-07 ENCOUNTER — Encounter: Payer: Self-pay | Admitting: Internal Medicine

## 2016-11-07 ENCOUNTER — Ambulatory Visit (INDEPENDENT_AMBULATORY_CARE_PROVIDER_SITE_OTHER): Payer: Medicare Other | Admitting: Internal Medicine

## 2016-11-07 ENCOUNTER — Encounter (HOSPITAL_COMMUNITY): Payer: Medicare Other

## 2016-11-07 VITALS — BP 122/68 | HR 80 | Ht 69.0 in | Wt 139.0 lb

## 2016-11-07 DIAGNOSIS — I2781 Cor pulmonale (chronic): Secondary | ICD-10-CM | POA: Diagnosis not present

## 2016-11-07 DIAGNOSIS — J9612 Chronic respiratory failure with hypercapnia: Secondary | ICD-10-CM

## 2016-11-07 DIAGNOSIS — J9611 Chronic respiratory failure with hypoxia: Secondary | ICD-10-CM

## 2016-11-07 DIAGNOSIS — I779 Disorder of arteries and arterioles, unspecified: Secondary | ICD-10-CM

## 2016-11-07 DIAGNOSIS — J449 Chronic obstructive pulmonary disease, unspecified: Secondary | ICD-10-CM | POA: Diagnosis not present

## 2016-11-07 NOTE — Progress Notes (Signed)
Subjective:    Patient ID: JOH RAO, male    DOB: 05-31-1929   MRN: [id number]     Summary: Pulmonary/ f/u ov try astepro for pnds   Primary Provider/Referring Provider: [name]    Brief patient profile:  39  yowm last smoked 1983 with GOLD III COPD with some reversibility and 02 dep at hs and with activity    History of Present Illness   10/19/2014  f/u ov/Raudel Bazen re: GOLD III / maint rx symbicort/ spiriva Chief Complaint  Patient presents with  . Follow-up    Pt c/o fatigue and increased chest congestion for the past several wks. He has am cough with white sputum. He is using xopenex 2 x per day on average.    on 10/8 started pred 20 and zpak as per med calendar action plan  gxt flat x 62mph x 15 min and zero grade on 2lpm with sats mid 90s at home  >decrease pred 5mg    11/19/2014 NP Follow up : COPD GOLD III  Returns for follow up .  Says he had a flare 4 days ago with increased SOB, occasional dry cough, chest tightness. Sinus pressure and drainage. Denies fever, wheezing, nausea or vomiting. Started on Zpack and prednisone 20mg  yesterday .  Feels better   rec Follow med calendar closely and bring to each visit.  Finish Zpack as directed.  Taper prednisone and hold at 5mg  daily  Please contact office for sooner follow up if symptoms do not improve or worsen or seek emergency care        04/15/2015  f/u ov/Kori Goins re: GOLD III/ hypercarbic/ maint rx symb and spiriva and pred floor of 10 mg and 02 lpm 24/7  Chief Complaint  Patient presents with  . Follow-up    Pt c/o increased nasal congestion and SOB x 2 wks. He is coughing more and producing minmal clear to white sputum.  He is using xopenex inhaler 2-3 x per day.    Wakes up feeling really lousy, mild ha and sluggish despite neg osa eval in past Not using flutter and quite congested in am as well  Doe = MMRC3 = can't walk 100 yards even at a slow pace at a flat grade s stopping due to sob  Even on 02/ not using  treadmill any more  >>cont current regimen     05/21/15 Eval by Dr Dedio/ rec trial of NIV   06/30/2015  f/u ov/Jaelan Rasheed re: GOLD IV copd/ 02 dep hypercarbic/hypoxemic on symb 160/spiriva Chief Complaint  Patient presents with  . Acute Visit    Pt c/o increased DOE for the past month. He is also c/o unintended wt loss over the past 3-4 months.    most of his problem is fatigue, not limiting sob, and has yet to be able to use NIV for more than a few min to maybe an hour/ very inactive rec Goal with the 02 is keep sats over 90%  When breathing worse go ahead and double prednisone dose as per the med calendar      Admit date: 09/24/2015 Discharge date: 09/26/2015 Primary Cardiologist:Dr. Tamala Julian   Discharge Diagnoses:  Principal Problem:   Angina at rest [facility]) Active Problems:   Essential hypertension   COPD GOLD IV/ 02 dep /steroid dep   Chronic respiratory failure with hypoxia and hypercapnia (HCC)   CAD (coronary artery disease)   Chest pain   Orthostatic hypotension not corrected on prednisone 20 mg daily    10/04/2015  Post hosp f/u ov/Keneshia Tena re:  Prednisone 20 mg daily  Plus symb 160 2bid/ spiriva respimat / flutter valve/ has still not started astral noct vent / 02 2lpm 24/7  Chief Complaint  Patient presents with  . Follow-up    Pt. wants to discuss being on Tiotropium and Pulmicort, breathing has not been better,been in the hospital twice, some coughing in the morning   main complaint is feeling lousy when stands up, not sob/ while  maintaining per cards  on pred 20 mg daily while still on multiple bp meds for which he is getting confused in terms of how to use.  No improvement in orthostasis on 20 mg per day (looks like also tried florinef prior to the being maint on higher "floor" prednisone dose.  Was waking up feeling flushed with high BP but apparently never happened on NIV as never used it "too much else goingtlined above  rec Continue prednisone 20mg  daily until breathing  better then 10 mg daily as per your med calendar  Put the new blood pressure medicines in pencil on your med calendar Go ahead and use the Astral as much as you can until see Dr Murlean Iba        04/17/2016  f/u ov/Mai Longnecker re:   COPD IV/ 02 dep /  maint  on symb/spiriva  Chief Complaint  Patient presents with  . Follow-up    COPD, more mucus production in his nasal passenges, hard to get air through the cannula, constantly out of breathe, he cant tell if the nebulizer medications ae helping him   still exercising daily x 10 min s stopping at 64mph flat but can't walk at [place] "I guess I walk too fast" sats are tu[oca;;u pl pm 2 lpm  Lots of nasal congestion, no using afrin or increase nasonex per action plan  rec Use afrin/ flonase to control rhinitis    06/13/2016  f/u ov/Kelcee Bjorn re:   COPD GOLD IV/ 02 dep/ maint symb/ spiriva/ 02 2lpm and NIV / trelegy per [place] Chief Complaint  Patient presents with  . Follow-up    Pt c/o feeling groggy and tired all of the time. He states having alot of nasal and sinus congestion.    walking on 2lpm on treadmill continuous flow x up to 10  min x 1 mph x no grade  Using afrin x 3 days chased but only using hs nasonex No correlation with daytime grogginess and time of day or whether uses NIV or not/ no am ha either way  rec As per instructions > prednisone is 20 mg daily until better then taper down to 5 mg daily  Change the afrin to where you take it 2 min before the nasonex but use it twice daily     09/13/2016  f/u ov/Katrell Milhorn re:  COPD GOLD IV/ 02 dep 2lpm 24/7 sym/spiriva and pred 5 mg daily  Chief Complaint  Patient presents with  . Follow-up    Down to pred 5 mg daily. He states he breathing "may be a little worse". He is using xopenex inhaler 3 x daily on average and he has not needed neb.    worse swelling legs x one month only on lasix 20 mg qod  L > R (instructions say 20 mg daily prn)  Sleeping ok off vent s groggy in am  Walking 10 min at 1 mph  on 2lpm no grade most days  rec No change rx    11/07/2016 acute extended ov/Jacoria Keiffer  re: COPD GOLD IV/ 02  Chief Complaint  Patient presents with  . Acute Visit    Pt is having SOB with exertion, not feeling well. Pt is feeling cold all the time, possible chills, he is more tired than normal. Pt not breathing as well as normal, symptoms has worsened.  worse x 3 weeks prior to OV  When power went off things went downhill and never improved No vent x 4 days no change in am loc or daytime doe on vs off vent Sob x 10 ft now Not using med cal action plan correctly/ never tried doubling the pred dose as prev instructed for worse breathing but didn't use the saba correctly either and  hfa now poor.  No obvious day to day or daytime variability or assoc excess/ purulent sputum or mucus plugs or hemoptysis or cp or chest tightness, subjective wheeze or overt sinus or hb symptoms. No unusual exp hx or h/o childhood pna/ asthma or knowledge of premature birth.  Sleeping ok flat without nocturnal  or early am exacerbation  of respiratory  c/o's or need for noct saba. Also denies any obvious fluctuation of symptoms with weather or environmental changes or other aggravating or alleviating factors except as outlined above   Current Allergies, Complete Past Medical History, Past Surgical History, Family History, and Social History were reviewed in [employer] record.  ROS  The following are not active complaints unless bolded Hoarseness, sore throat, dysphagia, dental problems, itching, sneezing,  nasal congestion or discharge of excess mucus or purulent secretions, ear ache,   fever, chills, sweats, unintended wt loss or wt gain, classically pleuritic or exertional cp,  orthopnea pnd or leg swelling, presyncope, palpitations, abdominal pain, anorexia, nausea, vomiting, diarrhea  or change in bowel habits or change in bladder habits, change in stools or change in urine, dysuria, hematuria,   rash, arthralgias, visual complaints, headache, numbness, weakness or ataxia or problems with walking or coordination,  change in mood/affect or memory.        Current Meds  Medication Sig  . ALPRAZolam (XANAX) 0.25 MG tablet Take 0.25 mg by mouth 2 (two) times daily as needed for anxiety. Takes 1/2 tablet two times daily.  Marland Kitchen aspirin 81 MG tablet Take 81 mg by mouth daily.  Marland Kitchen atorvastatin (LIPITOR) 20 MG tablet TAKE 1 TABLET BY MOUTH EVERY DAY  . Azelastine-Fluticasone (DYMISTA) 137-50 MCG/ACT SUSP Place 1 spray into the nose 2 (two) times daily.  Marland Kitchen b complex vitamins tablet Take 1 tablet by mouth daily.  . Cholecalciferol (VITAMIN D3) 2000 units TABS Take 2,000 Units by mouth daily.  . Cyanocobalamin (VITAMIN B 12 PO) Take by mouth.  . finasteride (PROSCAR) 5 MG tablet Take 5 mg by mouth every morning.  . fludrocortisone (FLORINEF) 0.1 MG tablet Take 0.1 mg by mouth daily as needed (BP < 100 STANDING).  . furosemide (LASIX) 20 MG tablet Take 20 mg by mouth daily as needed.  Marland Kitchen guaiFENesin (MUCINEX) 600 MG 12 hr tablet Take by mouth 2 (two) times daily as needed.  . hydrALAZINE (APRESOLINE) 25 MG tablet Take 1 tablet (25 mg total) by mouth every 12 (twelve) hours as needed (for BP >413 systolic or > 244 diastolic). If your BP is 010 systolic you may take one dose of the hydralazine that you have at home.  But would not do this more than twice a day.  . levalbuterol (XOPENEX) 0.63 MG/3ML nebulizer solution Take 3 mLs (0.63 mg total) by  nebulization every 4 (four) hours as needed for wheezing or shortness of breath.  . loratadine (CLARITIN) 10 MG tablet Take 10 mg by mouth daily as needed for allergies.  Marland Kitchen NITROSTAT 0.4 MG SL tablet DISSOLVE 1 TABLET UNDER THE TONGUE AS DIRECTED AND AS NEEDED FOR CHEST PAIN  . OXYGEN Take 2 L by mouth daily as needed.   . predniSONE (DELTASONE) 5 MG tablet Take 2.5 mg by mouth 2 (two) times daily.  . ranitidine (ZANTAC) 150 MG tablet Take 150 mg by mouth 2 (two)  times daily.  . sertraline (ZOLOFT) 25 MG tablet TK SS T PO FOR 1 WEEK THEN ONE T PO QD STARTING AFTER Merrilee Jansky  . SPIRIVA RESPIMAT 2.5 MCG/ACT AERS INHALE 2 PUFFS BY MOUTH EVERY DAY  . SYMBICORT 160-4.5 MCG/ACT inhaler INHALE 2 PUFFS BY MOUTH EVERY MORNING AND THEN ANOTHER 2 PUFFS ABOUT 12 HOURS LATER  . TOPROL XL 25 MG 24 hr tablet TAKE 1 TABLET BY MOUTH DAILY  . XOPENEX HFA 45 MCG/ACT inhaler INHALE 2 PUFFS BY MOUTH EVERY 4 HOURS AS NEEDED FOR WHEEZING OR SHORTNESS OF BREATH  . XOPENEX HFA 45 MCG/ACT inhaler INHALE 2 PUFFS BY MOUTH EVERY 4 HOURS AS NEEDED FOR WHEEZING OR SHORTNESS OF BREATH                            Past Medical History:  CHRONIC RHINITIS (ICD-472.0)  - Sinus ct March 03, 2010 > thickening only, no acute changes  CHRONIC RESPIRATORY FAILURE (ICD-518.83)  - 02 rx since 2008  COPD UNSPECIFIED (ICD-496)....................................................................[name]  - PFTs 05/04/05 FEV1 36% ratio 28% with 29% improvement after bronchodilators DLCO 48%  - PFT's 03/26/08 30% ratio 34 with 14% improvement after bronchodilators DLC0 38 %  - Start noct 02 at 2lpm 2008 and on 03/04/09 desat @ > 185 ft so rec wear with activtiy > rm to rm  - [facility] June 30, 2008 100% > confirmed March 04, 2009  ISCHEMIC HEART DISEASE (ICD-414.9)  HYPERTENSION (ICD-401.9)  HYPERLIPIDEMIA (ICD-272.4)  SPN RUL by cxr August 19, 2009  COMPLEX MED REGIMEN-Meds reviewed with pt education and computerized med calendar 11/22/09 03/10/2011  , 10/15/2012  07/17/2013 , 12/09/2013 , 06/15/2014 , 11/19/14 , 5.11.17          Objective:   Physical Exam  amb hoarse wm nad -  vital signs reviewed    - Note on arrival 02 sats  97% RA   wt 159 04/28/2010  > 10/14/2010 154  > 10/04/2011 150> 12/21/2011  150 > 149 01/29/2012 >154 04/05/2012 > 05/22/2012 148 > 09/30/2012  146 >>144> 10/15/2012 > 12/18/2012  145 > 148 02/14/2013 >148 03/14/2013 > 04/25/2013 148 > 06/19/2013 150 >>07/17/2013 > 08/27/2013 148  > 10/23/2013  148 >153 12/09/2013 > 02/26/2014  153 > 06/01/2014  152 > 157 06/15/2014 > 09/21/2014 146 > 10/19/2014 151 >>148 11/19/14 > 02/24/2015 148 > 06/30/2015    140 >  10/04/2015  136 > 12/20/2015   142 > 06/13/2016  132 > 09/13/2016   133 >  11/07/2016  139    HEENT: nl dentition, and oropharynx. Nl external ear canals without cough reflex - mild to moderate bilateral non-specific turbinate edema     NECK :  without JVD/Nodes/TM/ nl carotid upstrokes bilaterally   LUNGS: no acc muscle use,  Barrel contour chest / distant bs/ hyperresonant to percussion bilaterally s wheeze    CV:  RRR  no s3  or murmur or increase in P2, and  pitting ankle edema bilaterally 2+ L and 1+ R    ABD:  soft and nontender with nl inspiratory excursion in the supine position. No bruits or organomegaly appreciated, bowel sounds nl  MS:  Nl gait/ ext warm without deformities, calf tenderness, cyanosis or clubbing No obvious joint restrictions   SKIN: warm and dry    NEURO:  Alert,  nl sensorium with  no motor or cerebellar deficits apparent.      I personally reviewed images and agree with radiology impression as follows:  CXR:   10/31/16 COPD without acute abnormality.

## 2016-11-07 NOTE — Patient Instructions (Addendum)
Plan A = Automatic = symbicort 160 x2 pffs and spiriva 2 pffus first thing in am then 12 hours later take the symb 160   Work on inhaler technique:  relax and gently blow all the way out then take a nice smooth deep breath back in, triggering the inhaler at same time you start breathing in.  Hold for up to 5 seconds if you can. Blow out thru nose. Rinse and gargle with water when done      Plan B = Backup Only use your albuterol as a rescue medication to be used if you can't catch your breath by resting or doing a relaxed purse lip breathing pattern.  - The less you use it, the better it will work when you need it. - Ok to use the inhaler up to 2 puffs  every 4 hours if you must but call for appointment if use goes up over your usual need - Don't leave home without it !!  (think of it like the spare tire for your car)   Plan C = Crisis - only use your albuterol nebulizer if you first try Plan B and it fails to help > ok to use the nebulizer up to every 4 hours but if start needing it regularly call for immediate appointment   Plan D = Deltasone = prednisone > take 2 x 10 mg pill daily until  Breathing  bettern then 1 daily thereafter = 10 mg daily    See Tammy NP 4  weeks with all your medications, even over the counter meds, separated in two separate bags, the ones you take no matter what vs the ones you stop once you feel better and take only as needed when you feel you need them.   Tammy  will generate for you a new user friendly medication calendar that will put [place] all on the same page re: your medication use.     Without this process, it simply isn't possible to assure that we are providing  your outpatient care  with  the attention to detail we feel you deserve.   If we cannot assure that you're getting that kind of care,  then we cannot manage your problem effectively from this clinic.  Once you have seen Tammy and we are sure that we're all on the same page with your medication use  she will arrange follow up with me.

## 2016-11-08 NOTE — Assessment & Plan Note (Addendum)
- PFTs 05/04/05 FEV1 36% ratio 28% with 29% improvement after bronchodilators DLCO 48%  - PFT's 03/26/08 30% ratio 34 with 14% improvement after bronchodilators DLC0 38 %  - Referred to rehab 12/19/2012 > completed March 2015  - changed to spiriva respimat 02/14/2013  - started daily prednisone 06/19/13 > improved only a little> tapered off mid July 2015 > ok to change to prn prednisone 08/27/2013  - flared off nasonex and gerd rx 09/21/2014 > resumed - 10/19/2014  p extensive coaching HFA effectiveness =    90%  - changed pred to ceiling of 20 / floor of 5 mg daily as of 10/19/2014 > changed to floor of 10 mg daily 10/04/2015  - PFT's  06/08/2015  FEV1 0.58 (22 % ) ratio 27  p No sign % improvement from saba p symb/spiriva prior to study with DLCO  9/9 % corrects to 27 % for alv volume   - changed pred to ceiling of 20 and floor of 5 mg daily 06/13/16  - referred back to rehab 09/13/2016 >>>   Gradually worse x sev weeks ? Why   DDX of  difficult airways management almost all start with A and  include Adherence, Ace Inhibitors, Acid Reflux, Active Sinus Disease, Alpha 1 Antitripsin deficiency, Anxiety masquerading as Airways dz,  ABPA,  Allergy(esp in young), Aspiration (esp in elderly), Adverse effects of meds,  Active smokers, A bunch of PE's (a small clot burden can't cause this syndrome unless there is already severe underlying pulm or vascular dz with poor reserve) plus two Bs  = Bronchiectasis and Beta blocker use..and one C= CHF  Adherence is always the initial "prime suspect" and is a multilayered concern that requires a "trust but verify" approach in every patient - starting with knowing how to use medications, especially inhalers, correctly, keeping up with refills and understanding the fundamental difference between maintenance and prns vs those medications only taken for a very short course and then stopped and not refilled.  - 11/07/2016  After extensive coaching HFA effectiveness =    75% from a  baseline of 25% so will need to trust/ verify approp use or change to neb laba/ics next ov - Each maintenance medication was reviewed in detail including most importantly the difference between maintenance and as needed and under what circumstances the prns are to be used. This was done in the context of a medication calendar review which provided the patient with a user-friendly unambiguous mechanism for medication administration and reconciliation and provides an action plan for all active problems. It is critical that this be shown to every doctor  for modification during the office visit if necessary so the patient can use it as a working document.   ? Anxiety > usually at the bottom of this list of usual suspects but should be much higher on this pt's based on H and P and note already on psychotropics .      ? Allergy/ asthma > pred 20 mg until better then back to 10 mg daily as baseline   ? A bunch of PE's > neg venous dopplers reported neg on the L (the worse swollen) by PCP 10/24/16 per care everwhere   ? Beta blocker effects > very unlikely on such low doses of toprol  ? chf > appears to have cor pulmonale clinically/ rx reviewed (see separate a/p)    I had an extended discussion with the patient /wife reviewing all relevant studies completed to date and  lasting 25 minutes of a 40  minute acute office visit addressing   severe non-specific but potentially very serious refractory respiratory symptoms of uncertain and potentially multiple  etiologies.   Each maintenance medication was reviewed in detail including most importantly the difference between maintenance and prns and under what circumstances the prns are to be triggered using an action plan format that is not reflected in the computer generated alphabetically organized AVS.    Please see AVS for specific instructions unique to this office visit that I personally wrote and verbalized to the the pt in detail and then reviewed with  pt  by my nurse highlighting any changes in therapy/plan of care  recommended at today's visit.      Marland Kitchen

## 2016-11-08 NOTE — Assessment & Plan Note (Addendum)
-   Start noct 02 at 2lpm 2008 and on 03/04/09 desat @ > 185 ft so rec wear with activtiy > rm to rm  - 02/15/2011   Walked RA x one lap @ 185 stopped due to  desat > corrected on 2lpm - 02/26/2014  Walked 2lpm  2 laps @ 185 ft each stopped due to  Sob/ sats 88% at nl pace  - 10/19/2014   Walked RA  2 laps @ 185 ft each stopped due to  End of study, slow pace,  sob  But no  desat   - HCO3  10/27/15 = 34  - HC03  10/06/16  = 28 ? On noct vent (non compliant   rx as of 11/07/2016  = rec noct vent x at least 3 straight nights to see what if any benefit to daytime symptoms  My strong sense is that he has not really benefited from nocturnal ventilation.  Though somewhat paradoxic, when the lung fails to clear C02 properly and pC02 rises the lung then becomes a more efficient scavenger of C02 allowing lower work of breathing and  better C02 clearance albeit at a higher serum pC02 level - this is why pts can look a lot better than their ABG's would suggest and why it's so difficult to prognosticate endstage dz.  It's also why I strongly rec DNI status (ventilating pts down to a nl pC02 adversely affects this compensatory mechanism)   It may well be therefore he would be better off just to leave off the ventilator completely at night and let his bicarbonate reequilibrated a higher level which would reduce his work of breathing and anxiety related to dyspnea (rather than to use it intermittently which I think is the feeding the purpose of nocturnal ventilation)

## 2016-11-08 NOTE — Assessment & Plan Note (Signed)
Already on diuretics both maint and prns/ needs to follow the med calendar instructions and use the compression hose when up

## 2016-11-09 ENCOUNTER — Encounter (HOSPITAL_COMMUNITY): Payer: Medicare Other

## 2016-11-13 ENCOUNTER — Other Ambulatory Visit: Payer: Self-pay | Admitting: Adult Health

## 2016-11-14 ENCOUNTER — Encounter (HOSPITAL_COMMUNITY): Payer: Medicare Other

## 2016-11-14 ENCOUNTER — Encounter: Payer: Self-pay | Admitting: Interventional Cardiology

## 2016-11-14 ENCOUNTER — Ambulatory Visit (INDEPENDENT_AMBULATORY_CARE_PROVIDER_SITE_OTHER): Payer: Medicare Other | Admitting: Interventional Cardiology

## 2016-11-14 VITALS — BP 160/82 | HR 93 | Ht 69.0 in | Wt 135.8 lb

## 2016-11-14 DIAGNOSIS — I25709 Atherosclerosis of coronary artery bypass graft(s), unspecified, with unspecified angina pectoris: Secondary | ICD-10-CM

## 2016-11-14 DIAGNOSIS — I2781 Cor pulmonale (chronic): Secondary | ICD-10-CM | POA: Diagnosis not present

## 2016-11-14 DIAGNOSIS — I209 Angina pectoris, unspecified: Secondary | ICD-10-CM

## 2016-11-14 DIAGNOSIS — I779 Disorder of arteries and arterioles, unspecified: Secondary | ICD-10-CM | POA: Diagnosis not present

## 2016-11-14 DIAGNOSIS — E7849 Other hyperlipidemia: Secondary | ICD-10-CM

## 2016-11-14 DIAGNOSIS — I951 Orthostatic hypotension: Secondary | ICD-10-CM | POA: Diagnosis not present

## 2016-11-14 DIAGNOSIS — I10 Essential (primary) hypertension: Secondary | ICD-10-CM

## 2016-11-14 DIAGNOSIS — I739 Peripheral vascular disease, unspecified: Secondary | ICD-10-CM

## 2016-11-14 NOTE — Progress Notes (Signed)
Cardiology Office Note    Date:  11/14/2016   ID:  [name], DOB 05-20-1929, MRN [id number]  PCP:  [name], MD  Cardiologist: Sinclair Grooms, MD   Chief Complaint  Patient presents with  . Coronary Artery Disease    History of Present Illness:  [name] is a 83 y.o. male who presents for CAD with prior coronary bypass grafting, COPD with cor pulmonale, hyperlipidemia, hypertension, PAD, and history of carotid obstructive disease   He is doing well.  He saw a geriatrician at [place] is told to discontinue atorvastatin as diagnosed with "heart failure".  He denies angina.  He has some lower extremity swelling.  Appetite is poor.  Oxygen is 24 7.  He has not had syncope.  He does have fluctuations in blood pressure that are significant.   Past Medical History:  Diagnosis Date  . Asthma   . BPH (benign prostatic hyperplasia)    severe; s/p multiple biopsies  . CAD (coronary artery disease)   . Carotid artery occlusion   . Chronic rhinitis   . COPD (chronic obstructive pulmonary disease) (HCC)    2L [place] O2  . Hyperlipidemia   . Hypertension   . Peripheral vascular disease ([place])   . 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 Syncope and collapse   . Ureteral tumor 08/2015   had endoscopic procedure for evaluation, unable to reach for biopsy    Past Surgical History:  Procedure Laterality Date  . CATARACT EXTRACTION  11-20-11  . CORONARY ARTERY BYPASS GRAFT  2003  . LITHOTRIPSY    . PROSTATE BIOPSY  Oct. 2014    Current Medications: Outpatient Medications Prior to Visit  Medication Sig Dispense Refill  . ALPRAZolam (XANAX) 0.25 MG tablet Take 0.25 mg by mouth 2 (two) times daily as needed for anxiety. Takes 1/2 tablet two times daily.    Marland Kitchen aspirin 81 MG tablet Take 81 mg by mouth daily.    Marland Kitchen atorvastatin (LIPITOR) 20 MG tablet TAKE 1 TABLET BY MOUTH EVERY DAY 90 tablet 2  . b complex vitamins tablet Take 1 tablet by mouth daily.    . Cholecalciferol (VITAMIN D3) 2000  units TABS Take 2,000 Units by mouth daily.    . Cyanocobalamin (VITAMIN B 12 PO) Take 1 tablet daily by mouth.     . finasteride (PROSCAR) 5 MG tablet Take 5 mg by mouth every morning.    . fludrocortisone (FLORINEF) 0.1 MG tablet Take 0.1 mg by mouth daily as needed (BP < 100 STANDING).    . furosemide (LASIX) 20 MG tablet Take 20 mg by mouth daily as needed.    Marland Kitchen guaiFENesin (MUCINEX) 600 MG 12 hr tablet Take by mouth 2 (two) times daily as needed.    . hydrALAZINE (APRESOLINE) 25 MG tablet Take 1 tablet (25 mg total) by mouth every 12 (twelve) hours as needed (for BP >062 systolic or > 694 diastolic). If your BP is 854 systolic you may take one dose of the hydralazine that you have at home.  But would not do this more than twice a day.    . levalbuterol (XOPENEX) 0.63 MG/3ML nebulizer solution Take 3 mLs (0.63 mg total) by nebulization every 4 (four) hours as needed for wheezing or shortness of breath. 300 mL 12  . loratadine (CLARITIN) 10 MG tablet Take 10 mg by mouth daily as needed for allergies.    . mometasone (NASONEX) 50 MCG/ACT nasal spray Place 1  spray 2 (two) times daily into the nose.  2  . NITROSTAT 0.4 MG SL tablet DISSOLVE 1 TABLET UNDER THE TONGUE AS DIRECTED AND AS NEEDED FOR CHEST PAIN 25 tablet 5  . OXYGEN Inhale 2 L continuous into the lungs.     . predniSONE (DELTASONE) 10 MG tablet TAKE 1/2 TABLET(5 MG) BY MOUTH TWICE DAILY WITH A MEAL 30 tablet 0  . ranitidine (ZANTAC) 150 MG tablet Take 150 mg by mouth 2 (two) times daily.    . Sodium Chloride, Hypertonic, (MURO 128 OP) Place 1 drop 4 (four) times daily into both eyes.    Marland Kitchen SPIRIVA RESPIMAT 2.5 MCG/ACT AERS INHALE 2 PUFFS BY MOUTH EVERY DAY 4 g 11  . SYMBICORT 160-4.5 MCG/ACT inhaler INHALE 2 PUFFS BY MOUTH EVERY MORNING AND THEN ANOTHER 2 PUFFS ABOUT 12 HOURS LATER 10.2 g 11  . TOPROL XL 25 MG 24 hr tablet TAKE 1 TABLET BY MOUTH DAILY 30 tablet 11  . XOPENEX HFA 45 MCG/ACT inhaler INHALE 2 PUFFS BY MOUTH EVERY 4 HOURS AS  NEEDED FOR WHEEZING OR SHORTNESS OF BREATH 15 g 11  . Azelastine-Fluticasone (DYMISTA) 137-50 MCG/ACT SUSP Place 1 spray into the nose 2 (two) times daily. (Patient not taking: Reported on 11/14/2016) 1 Bottle 1  . predniSONE (DELTASONE) 5 MG tablet Take 2.5 mg by mouth 2 (two) times daily.  3  . sertraline (ZOLOFT) 25 MG tablet TK SS T PO FOR 1 WEEK THEN ONE T PO QD STARTING AFTER FINISHING XANAX  6  . XOPENEX HFA 45 MCG/ACT inhaler INHALE 2 PUFFS BY MOUTH EVERY 4 HOURS AS NEEDED FOR WHEEZING OR SHORTNESS OF BREATH (Patient not taking: Reported on 11/14/2016) 15 g 2   No facility-administered medications prior to visit.      Allergies:   Sulfonamide derivatives; Cefdinir; Ciprofloxacin; Nitrofurantoin; and Levaquin [levofloxacin]   Social History   Socioeconomic History  . Marital status: Married    Spouse name: None  . Number of children: 2  . Years of education: None  . Highest education level: None  Social Needs  . Financial resource strain: None  . Food insecurity - worry: None  . Food insecurity - inability: None  . Transportation needs - medical: None  . Transportation needs - non-medical: None  Occupational History  . 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 Smoking status: Former Smoker    Packs/day: 2.00    Years: 33.00    Pack years: 66.00    Types: Cigarettes    Last attempt to quit: 01/09/1982    Years since quitting: 34.8  . Smokeless tobacco: Never Used  Substance and Sexual Activity  . Alcohol use: Yes    Alcohol/week: 0.0 oz    Comment: 1.5-3 oz daily in the past, minimal use in the last few months  . Drug use: No  . Sexual activity: None  Other Topics Concern  . None  Social History Narrative   Pt lives at home with his spouse.           Family History:  The patient's family history includes Allergies in his brother; Breast cancer in his mother; Cancer in his mother; Heart attack in his father; Heart disease  in his brother, brother, father, and mother; Hyperlipidemia in his brother; Hypertension in his brother and mother; Other in his mother; Peripheral vascular disease in his brother; Stroke in his brother.   ROS:   Please see the history of  present illness.    Recent chills, leg swelling, hearing loss, vision disturbance, shortness of breath, abdominal pain, depression, back pain, dizziness, easy bruising, anxiety, irregular heartbeat, chest pressure, and excessive fatigue. All other systems reviewed and are negative.   PHYSICAL EXAM:   VS:  BP (!) 160/82   Pulse 93   Ht 5\' 9"  (1.753 m)   Wt 135 lb 12.8 oz (61.6 kg)   BMI 20.05 kg/m    GEN: Well nourished, well developed, in no acute distress.  Frail-appearing. HEENT: normal  Neck: no JVD, carotid bruits, or masses Cardiac: RRR; no murmurs, rubs, or gallops,no edema  Respiratory:  clear to auscultation bilaterally, normal work of breathing GI: soft, nontender, nondistended, + BS MS: no deformity or atrophy  Skin: warm and dry, no rash Neuro:  Alert and Oriented x 3, Strength and sensation are intact Psych: euthymic mood, full affect  Wt Readings from Last 3 Encounters:  11/14/16 135 lb 12.8 oz (61.6 kg)  11/07/16 139 lb (63 kg)  11/02/16 136 lb 0.4 oz (61.7 kg)      Studies/Labs Reviewed:   EKG:  EKG right bundle branch block, sinus rhythm with PACs, when compared to prior tracings, no significant changes are noted.  Recent Labs: 07/06/2016: ALT 20 10/03/2016: BUN 16; Creatinine, Ser 0.89; Potassium 4.1; Sodium 142   Lipid Panel    Component Value Date/Time   CHOL 135 09/13/2015 0320   TRIG 45 09/13/2015 0320   HDL 74 09/13/2015 0320   CHOLHDL 1.8 09/13/2015 0320   VLDL 9 09/13/2015 0320   LDLCALC 52 09/13/2015 0320    Additional studies/ records that were reviewed today include:  None    ASSESSMENT:    1. Coronary artery disease involving coronary bypass graft of native heart with angina pectoris ([place])   2.  Cor pulmonale, chronic (HCC)/ clinical dx    3. Essential hypertension   4. Orthostatic hypotension   5. Peripheral vascular disease ([place])   6. Other hyperlipidemia      PLAN:  In order of problems listed above:  1. Stable without angina 2. Stable without gross fluid overload.  Discussed the importance of continuous oxygen therapy. 3. Labile blood pressures.  The initial 160/82 had decreased to 115/70 after resting.  No change in therapy. 4. Still a significant problem.  I have advised against taking either Florinef or hydralazine to frequently. 5. Not addressed 6. Not addressed other than I have given him permission to discontinue atorvastatin for 2-3 months to see if lethargy/cognitive impairment improve.    Medication Adjustments/Labs and Tests Ordered: Current medicines are reviewed at length with the patient today.  Concerns regarding medicines are outlined above.  Medication changes, Labs and Tests ordered today are listed in the Patient Instructions below. There are no Patient Instructions on file for this visit.   Signed, Sinclair Grooms, MD  11/14/2016 4:02 PM    [facility] [address], [place], [place]  [address] Phone: [phone]; Fax: [phone]

## 2016-11-14 NOTE — Patient Instructions (Signed)
Medication Instructions:  1) You may hold your Atorvastatin for a month to see if symptoms improve.  Please contact our office and let us know.    Labwork: None  Testing/Procedures: None  Follow-Up: Your physician wants you to follow-up in: 1 year with Dr. Tamala Julian.  You will receive a reminder letter in the mail two months in advance. If you don't receive a letter, please call our office to schedule the follow-up appointment.   Any Other Special Instructions Will Be Listed Below (If Applicable).     If you need a refill on your cardiac medications before your next appointment, please call your pharmacy.

## 2016-11-15 ENCOUNTER — Other Ambulatory Visit: Payer: Self-pay | Admitting: Internal Medicine

## 2016-11-16 ENCOUNTER — Encounter (HOSPITAL_COMMUNITY)
Admission: RE | Admit: 2016-11-16 | Discharge: 2016-11-16 | Disposition: A | Discharge status: Home / Self Care | Payer: Medicare Other | Source: Ambulatory Visit | Attending: Internal Medicine | Admitting: Internal Medicine

## 2016-11-16 VITALS — Wt 135.4 lb

## 2016-11-16 DIAGNOSIS — R0600 Dyspnea, unspecified: Secondary | ICD-10-CM

## 2016-11-16 DIAGNOSIS — R0609 Other forms of dyspnea: Secondary | ICD-10-CM | POA: Insufficient documentation

## 2016-11-16 NOTE — Progress Notes (Signed)
Daily Session Note  Patient Details  Name: [name] MRN: [id number] Date of Birth: 11-13-1929 Referring Provider:     Pulmonary Rehab Walk Test from 10/05/2016 in [place]  Referring Provider  Dr. [name]      Encounter Date: 11/16/2016  Check In: Session Check In - 11/16/16 1340      Check-In   Location  [facility]    Staff Present  Rosebud Poles, RN, BSN;Molly diVincenzo, MS, ACSM RCEP, Exercise Physiologist;Vladislav Axelson Rollene Rotunda, RN, BSN    Supervising physician immediately available to respond to emergencies  [facility] immediately available    Physician(s)  Dr. Quincy Simmonds    Medication changes reported      No    Fall or balance concerns reported     No    Tobacco Cessation  No Change    Warm-up and Cool-down  Performed as group-led instruction    Resistance Training Performed  Yes    VAD Patient?  No      Pain Assessment   Currently in Pain?  No/denies    Multiple Pain Sites  No       Capillary Blood Glucose: No results found for this or any previous visit (from the past 24 hour(s)).  Exercise Prescription Changes - 11/16/16 1552      Response to Exercise   Blood Pressure (Admit)  124/60    Blood Pressure (Exercise)  140/70    Blood Pressure (Exit)  122/60    Heart Rate (Admit)  74 bpm    Heart Rate (Exercise)  92 bpm    Heart Rate (Exit)  79 bpm    Oxygen Saturation (Admit)  94 %    Oxygen Saturation (Exercise)  94 %    Oxygen Saturation (Exit)  100 %    Rating of Perceived Exertion (Exercise)  15    Perceived Dyspnea (Exercise)  3    Duration  Progress to 45 minutes of aerobic exercise without signs/symptoms of physical distress    Intensity  THRR unchanged      Progression   Progression  Continue to progress workloads to maintain intensity without signs/symptoms of physical distress.      Resistance Training   Training Prescription  Yes    Weight  orange bands    Reps  10-15    Time  10 Minutes      Oxygen   Oxygen  Continuous    Liters  2      Recumbant Bike   Level  1    Minutes  17      Track   Laps  5    Minutes  17       Social History   Tobacco Use  Smoking Status Former Smoker  . Packs/day: 2.00  . Years: 33.00  . Pack years: 66.00  . Types: Cigarettes  . Last attempt to quit: 01/09/1982  . Years since quitting: 34.8  Smokeless Tobacco Never Used    Goals Met:  Exercise tolerated well Queuing for purse lip breathing No report of cardiac concerns or symptoms Strength training completed today  Goals Unmet:  Not Applicable  Comments: Service time is from 1330 to 1530    Dr. Rush Farmer is Medical Director for Pulmonary Rehab at [facility].

## 2016-11-21 ENCOUNTER — Encounter (HOSPITAL_COMMUNITY)
Admission: RE | Admit: 2016-11-21 | Discharge: 2016-11-21 | Disposition: A | Discharge status: Home / Self Care | Payer: Medicare Other | Source: Ambulatory Visit | Attending: Internal Medicine | Admitting: Internal Medicine

## 2016-11-21 VITALS — Wt 135.4 lb

## 2016-11-21 DIAGNOSIS — R0609 Other forms of dyspnea: Secondary | ICD-10-CM | POA: Diagnosis not present

## 2016-11-21 DIAGNOSIS — R0600 Dyspnea, unspecified: Secondary | ICD-10-CM

## 2016-11-21 NOTE — Progress Notes (Signed)
Daily Session Note  Patient Details  Name: [name] MRN: [id number] Date of Birth: [birth year] Referring Provider:     Pulmonary Rehab Walk Test from 10/05/2016 in [place]  Referring Provider  Dr. [name]      Encounter Date: 11/21/2016  Check In: Session Check In - 11/21/16 1330      Check-In   Location  [facility]    Staff Present  Rosebud Poles, RN, BSN;Molly diVincenzo, MS, ACSM RCEP, Exercise Physiologist;Yamilette Garretson Ysidro Evert, RN;Portia Rollene Rotunda, RN, BSN    Supervising physician immediately available to respond to emergencies  [facility] immediately available    Physician(s)  Dr. Maylene Roes    Medication changes reported      No    Fall or balance concerns reported     No    Tobacco Cessation  No Change    Warm-up and Cool-down  Performed as group-led instruction    Resistance Training Performed  Yes    VAD Patient?  No      Pain Assessment   Currently in Pain?  No/denies    Multiple Pain Sites  Yes       Capillary Blood Glucose: No results found for this or any previous visit (from the past 24 hour(s)).  Exercise Prescription Changes - 11/21/16 1500      Response to Exercise   Blood Pressure (Admit)  148/78    Blood Pressure (Exercise)  142/80    Blood Pressure (Exit)  140/70    Heart Rate (Admit)  83 bpm    Heart Rate (Exercise)  91 bpm    Heart Rate (Exit)  65 bpm    Oxygen Saturation (Admit)  93 %    Oxygen Saturation (Exercise)  89 %    Oxygen Saturation (Exit)  99 %    Rating of Perceived Exertion (Exercise)  15    Perceived Dyspnea (Exercise)  4    Duration  Progress to 45 minutes of aerobic exercise without signs/symptoms of physical distress    Intensity  THRR unchanged      Progression   Progression  Continue to progress workloads to maintain intensity without signs/symptoms of physical distress.      Resistance Training   Training Prescription  Yes    Weight  orange bands    Reps  10-15    Time  10  Minutes      Oxygen   Oxygen  Continuous    Liters  2      NuStep   Level  1    Minutes  34    METs  1.8      Track   Laps  4    Minutes  17       Social History   Tobacco Use  Smoking Status Former Smoker  . Packs/day: 2.00  . Years: 33.00  . Pack years: 66.00  . Types: Cigarettes  . Last attempt to quit: 01/09/1982  . Years since quitting: 34.8  Smokeless Tobacco Never Used    Goals Met:  No report of cardiac concerns or symptoms Strength training completed today  Goals Unmet:  Not Applicable  Comments: Service time is from 1330 to 1500    Dr. Rush Farmer is Medical Director for Pulmonary Rehab at [facility].

## 2016-11-23 ENCOUNTER — Encounter (HOSPITAL_COMMUNITY)
Admission: RE | Admit: 2016-11-23 | Discharge: 2016-11-23 | Disposition: A | Discharge status: Home / Self Care | Payer: Medicare Other | Source: Ambulatory Visit | Attending: Internal Medicine | Admitting: Internal Medicine

## 2016-11-23 ENCOUNTER — Encounter (HOSPITAL_COMMUNITY)
Admission: RE | Admit: 2016-11-23 | Discharge: 2016-11-23 | Disposition: A | Discharge status: Home / Self Care | Payer: Medicare Other | Source: Ambulatory Visit | Attending: Family Medicine | Admitting: Family Medicine

## 2016-11-23 DIAGNOSIS — R0609 Other forms of dyspnea: Secondary | ICD-10-CM | POA: Diagnosis not present

## 2016-11-23 DIAGNOSIS — R0600 Dyspnea, unspecified: Secondary | ICD-10-CM

## 2016-11-23 NOTE — Progress Notes (Signed)
Daily Session Note  Patient Details  Name: [name] MRN: [id number] Date of Birth: May 04, 1929 Referring Provider:     Pulmonary Rehab Walk Test from 10/05/2016 in [place]  Referring Provider  Dr. [name]      Encounter Date: 11/23/2016  Check In: Session Check In - 11/23/16 1350      Check-In   Location  [facility]    Staff Present  Rosebud Poles, RN, BSN;Molly diVincenzo, MS, ACSM RCEP, Exercise Physiologist    Supervising physician immediately available to respond to emergencies  [facility] immediately available    Physician(s)  Dr. Broadus John    Medication changes reported      No    Fall or balance concerns reported     No    Tobacco Cessation  No Change    Warm-up and Cool-down  Performed as group-led instruction    Resistance Training Performed  Yes    VAD Patient?  No      Pain Assessment   Currently in Pain?  No/denies    Multiple Pain Sites  No       Capillary Blood Glucose: No results found for this or any previous visit (from the past 24 hour(s)).  Exercise Prescription Changes - 11/23/16 1500      Response to Exercise   Blood Pressure (Admit)  144/70    Blood Pressure (Exercise)  128/54    Blood Pressure (Exit)  130/72    Heart Rate (Admit)  98 bpm    Heart Rate (Exercise)  95 bpm    Heart Rate (Exit)  79 bpm    Oxygen Saturation (Admit)  94 %    Oxygen Saturation (Exercise)  92 %    Oxygen Saturation (Exit)  100 %    Rating of Perceived Exertion (Exercise)  15    Perceived Dyspnea (Exercise)  2    Duration  Progress to 45 minutes of aerobic exercise without signs/symptoms of physical distress    Intensity  THRR unchanged      Progression   Progression  Continue to progress workloads to maintain intensity without signs/symptoms of physical distress.      Resistance Training   Training Prescription  Yes    Weight  orange bands    Reps  10-15    Time  10 Minutes      Oxygen   Oxygen   Continuous    Liters  2      NuStep   Level  1    Minutes  15    METs  1.1      Track   Laps  5    Minutes  17      Home Exercise Plan   Plans to continue exercise at  Home (comment)    Frequency  Add 2 additional days to program exercise sessions.       Social History   Tobacco Use  Smoking Status Former Smoker  . Packs/day: 2.00  . Years: 33.00  . Pack years: 66.00  . Types: Cigarettes  . Last attempt to quit: 01/09/1982  . Years since quitting: 34.8  Smokeless Tobacco Never Used    Goals Met:  Using PLB without cueing & demonstrates good technique Exercise tolerated well No report of cardiac concerns or symptoms Strength training completed today  Goals Unmet:  Not Applicable  Comments: Service time is from 1330 to 1500   Dr. Rush Farmer is Medical Director for Pulmonary  Rehab at [facility].

## 2016-11-23 NOTE — Progress Notes (Signed)
I have reviewed a Home Exercise Prescription with [name] . [name] is currently exercising at home.  The patient was advised to walk 2-3 days a week for 30 minutes.  [name] and I discussed how to progress their exercise prescription. The patient is mainly sedentary at home. We discussed how to more active around the house.  The patient stated that their goals were to increase strength, stamina, and endurance. [name] would also like to gain more independence. Currently, his wife keeps up the household.  The patient stated that they understand the exercise prescription.  We reviewed exercise guidelines, target heart rate during exercise, oxygen use, weather, home pulse oximeter, endpoints for exercise, and goals.  Patient is encouraged to come to me with any questions. I will continue to follow up with the patient to assist them with progression and safety.

## 2016-11-23 NOTE — Progress Notes (Signed)
Pulmonary Individual Treatment Plan  Patient Details  Name: [name] MRN: [id number] Date of Birth: 12/27/1929 Referring Provider:     Pulmonary Rehab Walk Test from 10/05/2016 in [facility]  Referring Provider  Dr. [name]      Initial Encounter Date:    Pulmonary Rehab Walk Test from 10/05/2016 in [facility]  Date  10/05/16  Referring Provider  Dr. [name]      Visit Diagnosis: Dyspnea on exertion  Patient's Home Medications on Admission:   Current Outpatient Medications:  .  ALPRAZolam (XANAX) 0.25 MG tablet, Take 0.25 mg by mouth 2 (two) times daily as needed for anxiety. Takes 1/2 tablet two times daily., Disp: , Rfl:  .  aspirin 81 MG tablet, Take 81 mg by mouth daily., Disp: , Rfl:  .  atorvastatin (LIPITOR) 20 MG tablet, TAKE 1 TABLET BY MOUTH EVERY DAY, Disp: 90 tablet, Rfl: 2 .  b complex vitamins tablet, Take 1 tablet by mouth daily., Disp: , Rfl:  .  Cholecalciferol (VITAMIN D3) 2000 units TABS, Take 2,000 Units by mouth daily., Disp: , Rfl:  .  Cyanocobalamin (VITAMIN B 12 PO), Take 1 tablet daily by mouth. , Disp: , Rfl:  .  finasteride (PROSCAR) 5 MG tablet, Take 5 mg by mouth every morning., Disp: , Rfl:  .  fludrocortisone (FLORINEF) 0.1 MG tablet, Take 0.1 mg by mouth daily as needed (BP < 100 STANDING)., Disp: , Rfl:  .  furosemide (LASIX) 20 MG tablet, Take 20 mg by mouth daily as needed., Disp: , Rfl:  .  guaiFENesin (MUCINEX) 600 MG 12 hr tablet, Take by mouth 2 (two) times daily as needed., Disp: , Rfl:  .  hydrALAZINE (APRESOLINE) 25 MG tablet, Take 1 tablet (25 mg total) by mouth every 12 (twelve) hours as needed (for BP >654 systolic or > 650 diastolic). If your BP is 354 systolic you may take one dose of the hydralazine that you have at home.  But would not do this more than twice a day., Disp: , Rfl:  .  levalbuterol (XOPENEX) 0.63 MG/3ML nebulizer solution, Take 3 mLs (0.63 mg total) by  nebulization every 4 (four) hours as needed for wheezing or shortness of breath., Disp: 300 mL, Rfl: 12 .  loratadine (CLARITIN) 10 MG tablet, Take 10 mg by mouth daily as needed for allergies., Disp: , Rfl:  .  mometasone (NASONEX) 50 MCG/ACT nasal spray, Place 1 spray 2 (two) times daily into the nose., Disp: , Rfl: 2 .  NITROSTAT 0.4 MG SL tablet, DISSOLVE 1 TABLET UNDER THE TONGUE AS DIRECTED AND AS NEEDED FOR CHEST PAIN, Disp: 25 tablet, Rfl: 5 .  OXYGEN, Inhale 2 L continuous into the lungs. , Disp: , Rfl:  .  predniSONE (DELTASONE) 10 MG tablet, TAKE 1/2 TABLET(5 MG) BY MOUTH TWICE DAILY WITH A MEAL, Disp: 30 tablet, Rfl: 0 .  ranitidine (ZANTAC) 150 MG tablet, Take 150 mg by mouth 2 (two) times daily., Disp: , Rfl:  .  Sodium Chloride, Hypertonic, (MURO 128 OP), Place 1 drop 4 (four) times daily into both eyes., Disp: , Rfl:  .  SPIRIVA RESPIMAT 2.5 MCG/ACT AERS, INHALE 2 PUFFS BY MOUTH EVERY DAY, Disp: 4 g, Rfl: 11 .  SYMBICORT 160-4.5 MCG/ACT inhaler, INHALE 2 PUFFS BY MOUTH EVERY MORNING AND THEN ANOTHER 2 PUFFS ABOUT 12 HOURS LATER, Disp: 1 Inhaler, Rfl: 6 .  TOPROL XL 25 MG 24 hr tablet, TAKE  1 TABLET BY MOUTH DAILY, Disp: 30 tablet, Rfl: 11 .  XOPENEX HFA 45 MCG/ACT inhaler, INHALE 2 PUFFS BY MOUTH EVERY 4 HOURS AS NEEDED FOR WHEEZING OR SHORTNESS OF BREATH, Disp: 15 g, Rfl: 11  Past Medical History: Past Medical History:  Diagnosis Date  . Asthma   . BPH (benign prostatic hyperplasia)    severe; s/p multiple biopsies  . CAD (coronary artery disease)   . Carotid artery occlusion   . Chronic rhinitis   . COPD (chronic obstructive pulmonary disease) (HCC)    2L  O2  . Hyperlipidemia   . Hypertension   . Peripheral vascular disease ([place])   . 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 Syncope and collapse   . Ureteral tumor 08/2015   had endoscopic procedure for evaluation, unable to reach for biopsy    Tobacco Use: Social History   Tobacco Use  Smoking Status Former Smoker  .  Packs/day: 2.00  . Years: 33.00  . Pack years: 66.00  . Types: Cigarettes  . Last attempt to quit: 01/09/1982  . Years since quitting: 34.8  Smokeless Tobacco Never Used    Labs: Recent Chemical engineer    Labs for ITP Cardiac and Pulmonary Rehab Latest Ref Rng & Units 02/23/2010 06/06/2013 06/08/2015 09/13/2015 02/18/2016   Cholestrol 0 - 200 mg/dL - 131 - 135 -   LDLCALC 0 - 99 mg/dL - 58 - 52 -   HDL >40 mg/dL - 62.40 - 74 -   Trlycerides <150 mg/dL - 55.0 - 45 -   Hemoglobin A1c <5.7 % 5.7 (NOTE)                                                                       According to the ADA Clinical Practice Recommendations for 2011, when HbA1c is used as a screening test:   >=6.5%   Diagnostic of Diabetes Mellitus           (if abnormal result is confirmed)  5.7-6.4%   Increased risk of developing Diabetes Mellitus  References:Diagnosis and Classification of Diabetes Mellitus,Diabetes GYBW,3893,73(SKAJG 1):S62-S69 and Standards of Medical Care in         Diabetes - 2011,Diabetes Care,2011,34 (Suppl 1):S11-S61.(H) - - - -   PHART 7.350 - 7.450 - - 7.406 - 7.381   PCO2ART 32.0 - 48.0 mmHg - - 48.6(H) - 47.4   HCO3 20.0 - 28.0 mmol/L - - 29.9(H) - 27.4   TCO2 0 - 100 mmol/L - - 26.7 - -   O2SAT % - - 96.4 - 97.9      Capillary Blood Glucose: Lab Results  Component Value Date   GLUCAP 120 (H) 02/25/2010   GLUCAP 88 02/25/2010   GLUCAP 139 (H) 02/24/2010   GLUCAP 137 (H) 02/24/2010   GLUCAP 108 (H) 02/24/2010     Pulmonary Assessment Scores: Pulmonary Assessment Scores    Row Name 10/04/16 1238 10/05/16 1644       ADL UCSD   ADL Phase  Entry  Entry    SOB Score total  74  -      CAT Score   CAT Score  20 Entry  -      mMRC Score   mMRC Score  -  3       Pulmonary Function Assessment: Pulmonary Function Assessment - 10/02/16 1504      Breath   Bilateral Breath Sounds  Decreased    Shortness of Breath  Yes;Limiting activity       Exercise Target Goals:     Exercise Program Goal: Individual exercise prescription set with THRR, safety & activity barriers. Participant demonstrates ability to understand and report RPE using BORG scale, to self-measure pulse accurately, and to acknowledge the importance of the exercise prescription.  Exercise Prescription Goal: Starting with aerobic activity 30 plus minutes a day, 3 days per week for initial exercise prescription. Provide home exercise prescription and guidelines that participant acknowledges understanding prior to discharge.  Activity Barriers & Risk Stratification: Activity Barriers & Cardiac Risk Stratification - 10/02/16 1431      Activity Barriers & Cardiac Risk Stratification   Activity Barriers  Deconditioning;Muscular Weakness;Shortness of Breath;Arthritis left shoulder       6 Minute Walk: 6 Minute Walk    Row Name 10/05/16 1637         6 Minute Walk   Phase  Initial     Distance  800 feet     Walk Time  6 minutes     # of Rest Breaks  0     MPH  1.51     METS  2.15     RPE  13     Perceived Dyspnea   3     Symptoms  Yes (comment)     Comments  leg fatigue     Resting HR  106 bpm     Resting BP  138/69     Resting Oxygen Saturation   95 %     Exercise Oxygen Saturation  during 6 min walk  91 %     Max Ex. HR  106 bpm     Max Ex. BP  162/69     2 Minute Post BP  152/71       Interval HR   1 Minute HR  96     2 Minute HR  101     3 Minute HR  101     4 Minute HR  100     5 Minute HR  102     6 Minute HR  102     2 Minute Post HR  98     Interval Heart Rate?  Yes       Interval Oxygen   Interval Oxygen?  Yes     Baseline Oxygen Saturation %  95 %     1 Minute Oxygen Saturation %  96 %     1 Minute Liters of Oxygen  2 L     2 Minute Oxygen Saturation %  93 %     2 Minute Liters of Oxygen  2 L     3 Minute Oxygen Saturation %  93 %     3 Minute Liters of Oxygen  2 L     4 Minute Oxygen Saturation %  92 %     4 Minute Liters of Oxygen  2 L     5 Minute  Oxygen Saturation %  91 %     5 Minute Liters of Oxygen  2 L     6 Minute Oxygen Saturation %  91 %     6 Minute Liters of Oxygen  2 L     2 Minute Post Oxygen Saturation %  93 %  2 Minute Post Liters of Oxygen  2 L        Oxygen Initial Assessment: Oxygen Initial Assessment - 10/05/16 1643      Initial 6 min Walk   Oxygen Used  Continuous;E-Tanks    Liters per minute  2      Program Oxygen Prescription   Program Oxygen Prescription  Continuous;E-Tanks    Liters per minute  2       Oxygen Re-Evaluation: Oxygen Re-Evaluation    Row Name 10/30/16 0755 11/21/16 1010           Program Oxygen Prescription   Program Oxygen Prescription  Continuous;E-Tanks  Continuous;E-Tanks      Liters per minute  2  2        Home Oxygen   Home Oxygen Device  -  Liquid Oxygen;Home Concentrator;Portable Concentrator      Sleep Oxygen Prescription  Continuous  Continuous      Liters per minute  2  2      Home Exercise Oxygen Prescription  Continuous  Continuous      Liters per minute  2  2      Home at Rest Exercise Oxygen Prescription  Continuous  Continuous      Liters per minute  2  2      Compliance with Home Oxygen Use  Yes  Yes        Goals/Expected Outcomes   Short Term Goals  To learn and exhibit compliance with exercise, home and travel O2 prescription;To learn and understand importance of monitoring SPO2 with pulse oximeter and demonstrate accurate use of the pulse oximeter.;To learn and demonstrate proper pursed lip breathing techniques or other breathing techniques.;To learn and understand importance of maintaining oxygen saturations>88%;To learn and demonstrate proper use of respiratory medications  To learn and exhibit compliance with exercise, home and travel O2 prescription;To learn and understand importance of monitoring SPO2 with pulse oximeter and demonstrate accurate use of the pulse oximeter.;To learn and demonstrate proper pursed lip breathing techniques or other  breathing techniques.;To learn and understand importance of maintaining oxygen saturations>88%;To learn and demonstrate proper use of respiratory medications      Long  Term Goals  Exhibits compliance with exercise, home and travel O2 prescription;Maintenance of O2 saturations>88%;Compliance with respiratory medication;Verbalizes importance of monitoring SPO2 with pulse oximeter and return demonstration;Exhibits proper breathing techniques, such as pursed lip breathing or other method taught during program session;Demonstrates proper use of MDI's  Exhibits compliance with exercise, home and travel O2 prescription;Maintenance of O2 saturations>88%;Compliance with respiratory medication;Verbalizes importance of monitoring SPO2 with pulse oximeter and return demonstration;Exhibits proper breathing techniques, such as pursed lip breathing or other method taught during program session;Demonstrates proper use of MDI's      Comments  patient compliant with home O2 use. observed using his pulse oximeter to monitor his SPO2. He uses PLB without cueing  patient compliant with home O2 use. observed using his pulse oximeter to monitor his SPO2. He uses PLB without cueing      Goals/Expected Outcomes  -  continued compliance with oxygen useage         Oxygen Discharge (Final Oxygen Re-Evaluation): Oxygen Re-Evaluation - 11/21/16 1010      Program Oxygen Prescription   Program Oxygen Prescription  Continuous;E-Tanks    Liters per minute  2      Home Oxygen   Home Oxygen Device  Liquid Oxygen;Home Concentrator;Portable Concentrator    Sleep Oxygen Prescription  Continuous    Liters  per minute  2    Home Exercise Oxygen Prescription  Continuous    Liters per minute  2    Home at Rest Exercise Oxygen Prescription  Continuous    Liters per minute  2    Compliance with Home Oxygen Use  Yes      Goals/Expected Outcomes   Short Term Goals  To learn and exhibit compliance with exercise, home and travel O2  prescription;To learn and understand importance of monitoring SPO2 with pulse oximeter and demonstrate accurate use of the pulse oximeter.;To learn and demonstrate proper pursed lip breathing techniques or other breathing techniques.;To learn and understand importance of maintaining oxygen saturations>88%;To learn and demonstrate proper use of respiratory medications    Long  Term Goals  Exhibits compliance with exercise, home and travel O2 prescription;Maintenance of O2 saturations>88%;Compliance with respiratory medication;Verbalizes importance of monitoring SPO2 with pulse oximeter and return demonstration;Exhibits proper breathing techniques, such as pursed lip breathing or other method taught during program session;Demonstrates proper use of MDI's    Comments  patient compliant with home O2 use. observed using his pulse oximeter to monitor his SPO2. He uses PLB without cueing    Goals/Expected Outcomes  continued compliance with oxygen useage       Initial Exercise Prescription: Initial Exercise Prescription - 10/05/16 1600      Date of Initial Exercise RX and Referring Provider   Date  10/05/16    Referring Provider  Dr. [name]      Oxygen   Oxygen  Continuous    Liters  2      Recumbant Bike   Level  1    Minutes  17      NuStep   Level  1    Minutes  17    METs  1.2      Track   Laps  3    Minutes  17      Prescription Details   Frequency (times per week)  2    Duration  Progress to 45 minutes of aerobic exercise without signs/symptoms of physical distress      Intensity   THRR 40-80% of Max Heartrate  54-108    Ratings of Perceived Exertion  11-13    Perceived Dyspnea  0-4      Progression   Progression  Continue progressive overload as per policy without signs/symptoms or physical distress.      Resistance Training   Training Prescription  Yes    Weight  orange bands    Reps  10-15       Perform Capillary Blood Glucose checks as needed.  Exercise  Prescription Changes: Exercise Prescription Changes    Row Name 10/12/16 1600 10/17/16 1600 11/02/16 1500 11/16/16 1552 11/21/16 1500     Response to Exercise   Blood Pressure (Admit)  152/74  134/76  128/56  124/60  148/78   Blood Pressure (Exercise)  170/72  154/72  142/76  140/70  142/80   Blood Pressure (Exit)  158/80  120/70  126/64  122/60  140/70   Heart Rate (Admit)  85 bpm  92 bpm  75 bpm  74 bpm  83 bpm   Heart Rate (Exercise)  94 bpm  94 bpm  88 bpm  92 bpm  91 bpm   Heart Rate (Exit)  82 bpm  76 bpm  85 bpm  79 bpm  65 bpm   Oxygen Saturation (Admit)  97 %  91 %  94 %  94 %  93 %   Oxygen Saturation (Exercise)  94 %  94 %  97 %  94 %  89 %   Oxygen Saturation (Exit)  99 %  99 %  99 %  100 %  99 %   Rating of Perceived Exertion (Exercise)  _0 Perceived Dyspnea (Exercise)  _1 Duration  Progress to 45 minutes of aerobic exercise without signs/symptoms of physical distress  Progress to 45 minutes of aerobic exercise without signs/symptoms of physical distress  Progress to 45 minutes of aerobic exercise without signs/symptoms of physical distress  Progress to 45 minutes of aerobic exercise without signs/symptoms of physical distress  Progress to 45 minutes of aerobic exercise without signs/symptoms of physical distress   Intensity  Other (comment) 40-80% of HRR  THRR unchanged  THRR unchanged  THRR unchanged  THRR unchanged     Progression   Progression  Continue to progress workloads to maintain intensity without signs/symptoms of physical distress.  Continue to progress workloads to maintain intensity without signs/symptoms of physical distress.  Continue to progress workloads to maintain intensity without signs/symptoms of physical distress.  Continue to progress workloads to maintain intensity without signs/symptoms of physical distress.  Continue to progress workloads to maintain intensity without signs/symptoms of physical distress.     Resistance  Training   Training Prescription  Yes  Yes  Yes  Yes  Yes   Weight  orange bands  orange bands  orange bands  orange bands  orange bands   Reps  10-15  10-15  10-15  10-15  10-15   Time  10 Minutes  10 Minutes  10 Minutes  10 Minutes  10 Minutes     Oxygen   Oxygen  Continuous  Continuous  Continuous  Continuous  Continuous   Liters  _2 Recumbant Bike   Level  1  1  -  1  -   Minutes  17  17  -  17  -     NuStep   Level  -  -  1  -  1   Minutes  -  -  17  -  34   METs  -  -  1.4  -  1.8     Track   Laps  _3 Minutes  _4 Home Exercise Plan   Plans to continue exercise at  -  -  -  -  Home (comment)   Frequency  -  -  -  -  Add 2 additional days to program exercise sessions.      Exercise Comments: Exercise Comments    Row Name 11/23/16 [phone]           Exercise Comments  Home exercise completed          Exercise Goals and Review: Exercise Goals    Row Name 10/02/16 1435             Exercise Goals   Increase Physical Activity  Yes       Intervention  Provide advice, education, support and counseling about physical activity/exercise needs.;Develop an individualized exercise prescription for aerobic and resistive training based on initial evaluation findings, risk stratification, comorbidities and  participant's personal goals.       Expected Outcomes  Achievement of increased cardiorespiratory fitness and enhanced flexibility, muscular endurance and strength shown through measurements of functional capacity and personal statement of participant.       Increase Strength and Stamina  Yes       Intervention  Provide advice, education, support and counseling about physical activity/exercise needs.;Develop an individualized exercise prescription for aerobic and resistive training based on initial evaluation findings, risk stratification, comorbidities and participant's personal goals.       Expected Outcomes  Achievement of  increased cardiorespiratory fitness and enhanced flexibility, muscular endurance and strength shown through measurements of functional capacity and personal statement of participant.       Able to understand and use rate of perceived exertion (RPE) scale  Yes       Intervention  Provide education and explanation on how to use RPE scale       Expected Outcomes  Short Term: Able to use RPE daily in rehab to express subjective intensity level;Long Term:  Able to use RPE to guide intensity level when exercising independently       Able to understand and use Dyspnea scale  Yes       Intervention  Provide education and explanation on how to use Dyspnea scale       Expected Outcomes  Short Term: Able to use Dyspnea scale daily in rehab to express subjective sense of shortness of breath during exertion;Long Term: Able to use Dyspnea scale to guide intensity level when exercising independently       Knowledge and understanding of Target Heart Rate Range (THRR)  Yes       Intervention  Provide education and explanation of THRR including how the numbers were predicted and where they are located for reference       Expected Outcomes  Short Term: Able to state/look up THRR;Long Term: Able to use THRR to govern intensity when exercising independently;Short Term: Able to use daily as guideline for intensity in rehab       Understanding of Exercise Prescription  Yes       Intervention  Provide education, explanation, and written materials on patient's individual exercise prescription       Expected Outcomes  Short Term: Able to explain program exercise prescription;Long Term: Able to explain home exercise prescription to exercise independently          Exercise Goals Re-Evaluation : Exercise Goals Re-Evaluation    Row Name 10/28/16 1340 11/20/16 1647           Exercise Goal Re-Evaluation   Exercise Goals Review  Increase Physical Activity;Increase Strength and Stamina;Able to understand and use rate of  perceived exertion (RPE) scale;Knowledge and understanding of Target Heart Rate Range (THRR);Understanding of Exercise Prescription;Able to understand and use Dyspnea scale  Increase Strength and Stamina;Increase Physical Activity;Able to understand and use Dyspnea scale;Able to understand and use rate of perceived exertion (RPE) scale;Knowledge and understanding of Target Heart Rate Range (THRR);Understanding of Exercise Prescription      Comments  Patient has only attended two exercise sessions. Will cont. to monitor and progress as able.   Patient is very deconditioned. Has a hard time getting to rehab. Has been out with URI. Has only attended 4 sessions. Will cont. to monitor and progress as able.       Expected Outcomes  Through exercising at rehab and at home, patient will increase physical strength and stamina and find ADL's easier to  perform.   Through exercise at rehab and at home, patient will increase strength and stamina and will find that ADL's are easier to preform.          Discharge Exercise Prescription (Final Exercise Prescription Changes): Exercise Prescription Changes - 11/21/16 1500      Response to Exercise   Blood Pressure (Admit)  148/78    Blood Pressure (Exercise)  142/80    Blood Pressure (Exit)  140/70    Heart Rate (Admit)  83 bpm    Heart Rate (Exercise)  91 bpm    Heart Rate (Exit)  65 bpm    Oxygen Saturation (Admit)  93 %    Oxygen Saturation (Exercise)  89 %    Oxygen Saturation (Exit)  99 %    Rating of Perceived Exertion (Exercise)  15    Perceived Dyspnea (Exercise)  4    Duration  Progress to 45 minutes of aerobic exercise without signs/symptoms of physical distress    Intensity  THRR unchanged      Progression   Progression  Continue to progress workloads to maintain intensity without signs/symptoms of physical distress.      Resistance Training   Training Prescription  Yes    Weight  orange bands    Reps  10-15    Time  10 Minutes      Oxygen    Oxygen  Continuous    Liters  2      NuStep   Level  1    Minutes  34    METs  1.8      Track   Laps  4    Minutes  17      Home Exercise Plan   Plans to continue exercise at  Home (comment)    Frequency  Add 2 additional days to program exercise sessions.       Nutrition:  Target Goals: Understanding of nutrition guidelines, daily intake of sodium <1573m, cholesterol <2076m calories 30% from fat and 7% or less from saturated fats, daily to have 5 or more servings of fruits and vegetables.  Biometrics:    Nutrition Therapy Plan and Nutrition Goals: Nutrition Therapy & Goals - 10/19/16 1435      Nutrition Therapy   Diet  High Calorie, High Protein       Personal Nutrition Goals   Nutrition Goal  The pt will recognize symptoms that can interfere with adequate oral intake, such as shortness of breath, N/V, early satiety, fatigue, ability to secure and prepare food, taste and smell changes, chewing/swallowing difficulties, and/ or pain when eating.    Personal Goal #2  Identify food quantities necessary to prevent further wt loss/ promote wt gain at graduation from pulmonary rehab.      Intervention Plan   Intervention  Prescribe, educate and counsel regarding individualized specific dietary modifications aiming towards targeted core components such as weight, hypertension, lipid management, diabetes, heart failure and other comorbidities.    Expected Outcomes  Short Term Goal: Understand basic principles of dietary content, such as calories, fat, sodium, cholesterol and nutrients.;Long Term Goal: Adherence to prescribed nutrition plan.       Nutrition Discharge: Rate Your Plate Scores: Nutrition Assessments - 10/06/16 1124      Rate Your Plate Scores   Post Score  58       Nutrition Goals Re-Evaluation:   Nutrition Goals Discharge (Final Nutrition Goals Re-Evaluation):   Psychosocial: Target Goals: Acknowledge presence or absence of significant depression  and/or stress, maximize coping skills, provide positive support system. Participant is able to verbalize types and ability to use techniques and skills needed for reducing stress and depression.  Initial Review & Psychosocial Screening: Initial Psych Review & Screening - 10/02/16 1505      Initial Review   Current issues with  Current Sleep Concerns;Current Depression      Family Dynamics   Good Support System?  Yes      Barriers   Psychosocial barriers to participate in program  There are no identifiable barriers or psychosocial needs.      Screening Interventions   Interventions  Encouraged to exercise       Quality of Life Scores:   PHQ-9: Recent Review Flowsheet Data    Depression screen [facility] 2/9 10/02/2016 10/06/2015 12/26/2012   Decreased Interest _0 Down, Depressed, Hopeless 3 0 1   PHQ - 2 Score _1 Altered sleeping 3 - 0   Tired, decreased energy 3 - 3   Change in appetite 0 - -   Feeling bad or failure about yourself  1 - 0   Trouble concentrating 0 - 0   Moving slowly or fidgety/restless 0 - 0   Suicidal thoughts 0 - 0   PHQ-9 Score 11 - 5   Difficult doing work/chores Somewhat difficult - -     Interpretation of Total Score  Total Score Depression Severity:  1-4 = Minimal depression, 5-9 = Mild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

## 2016-11-24 ENCOUNTER — Other Ambulatory Visit: Payer: Self-pay | Admitting: Family Medicine

## 2016-11-24 DIAGNOSIS — F324 Major depressive disorder, single episode, in partial remission: Secondary | ICD-10-CM | POA: Diagnosis not present

## 2016-11-24 DIAGNOSIS — R5383 Other fatigue: Secondary | ICD-10-CM | POA: Diagnosis not present

## 2016-11-24 DIAGNOSIS — R350 Frequency of micturition: Secondary | ICD-10-CM | POA: Diagnosis not present

## 2016-11-24 DIAGNOSIS — I10 Essential (primary) hypertension: Secondary | ICD-10-CM | POA: Diagnosis not present

## 2016-11-24 DIAGNOSIS — E559 Vitamin D deficiency, unspecified: Secondary | ICD-10-CM | POA: Diagnosis not present

## 2016-11-24 DIAGNOSIS — R1011 Right upper quadrant pain: Secondary | ICD-10-CM | POA: Diagnosis not present

## 2016-11-24 DIAGNOSIS — J449 Chronic obstructive pulmonary disease, unspecified: Secondary | ICD-10-CM | POA: Diagnosis not present

## 2016-11-28 ENCOUNTER — Encounter (HOSPITAL_COMMUNITY)
Admission: RE | Admit: 2016-11-28 | Discharge: 2016-11-28 | Disposition: A | Discharge status: Home / Self Care | Payer: Medicare Other | Source: Ambulatory Visit | Attending: Internal Medicine | Admitting: Internal Medicine

## 2016-11-28 VITALS — Wt 136.7 lb

## 2016-11-28 DIAGNOSIS — R0609 Other forms of dyspnea: Secondary | ICD-10-CM | POA: Diagnosis not present

## 2016-11-28 DIAGNOSIS — R0600 Dyspnea, unspecified: Secondary | ICD-10-CM

## 2016-11-28 NOTE — Progress Notes (Signed)
Daily Session Note  Patient Details  Name: [name] MRN: [id number] Date of Birth: 10/19/1929 Referring Provider:     Pulmonary Rehab Walk Test from 10/05/2016 in [place]  Referring Provider  Dr. [name]      Encounter Date: 11/28/2016  Check In: Session Check In - 11/28/16 1330      Check-In   Location  [facility]    Staff Present  Rosebud Poles, RN, BSN;Molly diVincenzo, MS, ACSM RCEP, Exercise Physiologist;Lisa Ysidro Evert, RN;Portia Rollene Rotunda, RN, BSN    Supervising physician immediately available to respond to emergencies  [facility] immediately available    Physician(s)  Dr. Maylene Roes    Medication changes reported      No    Fall or balance concerns reported     No    Tobacco Cessation  No Change    Warm-up and Cool-down  Performed as group-led instruction    Resistance Training Performed  Yes    VAD Patient?  No      Pain Assessment   Currently in Pain?  No/denies    Multiple Pain Sites  No       Capillary Blood Glucose: No results found for this or any previous visit (from the past 24 hour(s)).  Exercise Prescription Changes - 11/28/16 1500      Response to Exercise   Blood Pressure (Admit)  134/72    Blood Pressure (Exercise)  150/80    Blood Pressure (Exit)  132/70    Heart Rate (Admit)  79 bpm    Heart Rate (Exercise)  91 bpm    Heart Rate (Exit)  73 bpm    Oxygen Saturation (Admit)  97 %    Oxygen Saturation (Exercise)  96 %    Oxygen Saturation (Exit)  98 %    Rating of Perceived Exertion (Exercise)  13    Perceived Dyspnea (Exercise)  3    Duration  Progress to 45 minutes of aerobic exercise without signs/symptoms of physical distress    Intensity  THRR unchanged      Progression   Progression  Continue to progress workloads to maintain intensity without signs/symptoms of physical distress.      Resistance Training   Training Prescription  Yes    Weight  orange bands    Reps  10-15    Time  10  Minutes      Oxygen   Oxygen  Continuous    Liters  2      Recumbant Bike   Level  4    Minutes  17      NuStep   Level  2    Minutes  17    METs  1.2      Track   Laps  6    Minutes  17       Social History   Tobacco Use  Smoking Status Former Smoker  . Packs/day: 2.00  . Years: 33.00  . Pack years: 66.00  . Types: Cigarettes  . Last attempt to quit: 01/09/1982  . Years since quitting: 34.9  Smokeless Tobacco Never Used    Goals Met:  Exercise tolerated well Strength training completed today  Goals Unmet:  Not Applicable  Comments: Service time is from 1330 to 1500    Dr. Rush Farmer is Medical Director for Pulmonary Rehab at [facility].

## 2016-11-29 ENCOUNTER — Telehealth: Payer: Self-pay | Admitting: Internal Medicine

## 2016-11-29 DIAGNOSIS — J9611 Chronic respiratory failure with hypoxia: Secondary | ICD-10-CM

## 2016-11-29 DIAGNOSIS — G4733 Obstructive sleep apnea (adult) (pediatric): Secondary | ICD-10-CM

## 2016-11-29 DIAGNOSIS — J9612 Chronic respiratory failure with hypercapnia: Secondary | ICD-10-CM

## 2016-11-29 NOTE — Telephone Encounter (Signed)
Order has been placed to [facility] and requested for order to be faxed to [facility] at number provided.

## 2016-11-29 NOTE — Telephone Encounter (Signed)
Spoke with [employer]. She needs MW last note to state the pt is using and benefiting from NIV. I looked at his last note and it looks like MW wanted him to be completely off the vent. If so, can we place an order to discontinue? Then fax the d/c order to the number below, Attn[facility] [place]   Ph: [phone] Fax: [phone]

## 2016-11-29 NOTE — Telephone Encounter (Signed)
Pt was to try off vs on for a week at a time to see if he noted any improvement and if not then d/c it so if he says he could not tell a difference then ok to d/c

## 2016-12-01 ENCOUNTER — Other Ambulatory Visit: Payer: Self-pay | Admitting: Internal Medicine

## 2016-12-05 ENCOUNTER — Encounter (HOSPITAL_COMMUNITY)
Admission: RE | Admit: 2016-12-05 | Discharge: 2016-12-05 | Disposition: A | Discharge status: Home / Self Care | Payer: Medicare Other | Source: Ambulatory Visit | Attending: Internal Medicine | Admitting: Internal Medicine

## 2016-12-05 VITALS — Wt 136.9 lb

## 2016-12-05 DIAGNOSIS — R0600 Dyspnea, unspecified: Secondary | ICD-10-CM

## 2016-12-05 DIAGNOSIS — R0609 Other forms of dyspnea: Principal | ICD-10-CM

## 2016-12-05 NOTE — Progress Notes (Signed)
Daily Session Note  Patient Details  Name: [name] MRN: [id number] Date of Birth: 1930-01-01 Referring Provider:     Pulmonary Rehab Walk Test from 10/05/2016 in [place]  Referring Provider  Dr. [name]      Encounter Date: 12/05/2016  Check In: Session Check In - 12/05/16 1547      Check-In   Location  [facility]    Staff Present  Rosebud Poles, RN, BSN;Molly diVincenzo, MS, ACSM RCEP, Exercise Physiologist;Valene Villa Ysidro Evert, RN;Portia Rollene Rotunda, RN, BSN    Supervising physician immediately available to respond to emergencies  [facility] immediately available    Physician(s)  Dr. Maylene Roes    Medication changes reported      No    Fall or balance concerns reported     No    Tobacco Cessation  No Change    Warm-up and Cool-down  Performed as group-led instruction    Resistance Training Performed  Yes    VAD Patient?  No      Pain Assessment   Currently in Pain?  No/denies    Multiple Pain Sites  No       Capillary Blood Glucose: No results found for this or any previous visit (from the past 24 hour(s)).  Exercise Prescription Changes - 12/05/16 1500      Response to Exercise   Blood Pressure (Admit)  144/70    Blood Pressure (Exercise)  136/54    Blood Pressure (Exit)  110/66    Heart Rate (Admit)  86 bpm    Heart Rate (Exercise)  94 bpm    Heart Rate (Exit)  83 bpm    Oxygen Saturation (Admit)  96 %    Oxygen Saturation (Exercise)  93 %    Oxygen Saturation (Exit)  98 %    Rating of Perceived Exertion (Exercise)  17    Perceived Dyspnea (Exercise)  3    Duration  Progress to 45 minutes of aerobic exercise without signs/symptoms of physical distress    Intensity  THRR unchanged      Progression   Progression  Continue to progress workloads to maintain intensity without signs/symptoms of physical distress.      Resistance Training   Training Prescription  Yes    Weight  orange bands    Reps  10-15    Time  10  Minutes      Oxygen   Oxygen  Continuous    Liters  2      Recumbant Bike   Level  1    Minutes  17      NuStep   Level  2    Minutes  17    METs  1.3      Track   Laps  5    Minutes  17       Social History   Tobacco Use  Smoking Status Former Smoker  . Packs/day: 2.00  . Years: 33.00  . Pack years: 66.00  . Types: Cigarettes  . Last attempt to quit: 01/09/1982  . Years since quitting: 34.9  Smokeless Tobacco Never Used    Goals Met:  Exercise tolerated well No report of cardiac concerns or symptoms Strength training completed today  Goals Unmet:  Not Applicable  Comments: Service time is from 1330 to 1515    Dr. Rush Farmer is Medical Director for Pulmonary Rehab at [facility].

## 2016-12-06 ENCOUNTER — Ambulatory Visit
Admission: RE | Admit: 2016-12-06 | Discharge: 2016-12-06 | Disposition: A | Discharge status: Home / Self Care | Payer: Medicare Other | Source: Ambulatory Visit | Attending: Family Medicine | Admitting: Family Medicine

## 2016-12-06 ENCOUNTER — Telehealth: Payer: Self-pay | Admitting: Internal Medicine

## 2016-12-06 DIAGNOSIS — I10 Essential (primary) hypertension: Secondary | ICD-10-CM

## 2016-12-06 DIAGNOSIS — N200 Calculus of kidney: Secondary | ICD-10-CM | POA: Diagnosis not present

## 2016-12-06 MED ORDER — AZITHROMYCIN 250 MG PO TABS: ORAL_TABLET | ORAL | 11 refills | Status: DC

## 2016-12-06 NOTE — Telephone Encounter (Signed)
ATC pt, no answer. Left message for pt to call back. Left a detailed message letting him know I sent this in.

## 2016-12-06 NOTE — Addendum Note (Signed)
Addended by: Jannette Spanner on: 12/06/2016 05:30 PM   Modules accepted: Orders

## 2016-12-06 NOTE — Telephone Encounter (Signed)
Called and spoke with pt who states that he is having an infection or a cold and feels terrible. Stated his secretions have increased and are mostly white to yellow in color and states that he does not want this to go into a full exacerbation.  Pt requesting an Rx of Zpak.  Dr. [name], please advise.

## 2016-12-06 NOTE — Telephone Encounter (Signed)
Ok to refill x 11 as it's part of his action plan on his med calendar  ie if  he looks at his last rx he might find out the one he has still has refills

## 2016-12-07 ENCOUNTER — Encounter: Payer: Medicare Other | Admitting: Adult Health

## 2016-12-07 ENCOUNTER — Telehealth (HOSPITAL_COMMUNITY): Payer: Self-pay | Admitting: Family Medicine

## 2016-12-07 ENCOUNTER — Encounter (HOSPITAL_COMMUNITY): Payer: Medicare Other

## 2016-12-12 ENCOUNTER — Encounter (HOSPITAL_COMMUNITY)
Admission: RE | Admit: 2016-12-12 | Discharge: 2016-12-12 | Disposition: A | Discharge status: Home / Self Care | Payer: Medicare Other | Source: Ambulatory Visit | Attending: Internal Medicine | Admitting: Internal Medicine

## 2016-12-12 ENCOUNTER — Telehealth (HOSPITAL_COMMUNITY): Payer: Self-pay | Admitting: Family Medicine

## 2016-12-12 DIAGNOSIS — R0609 Other forms of dyspnea: Secondary | ICD-10-CM | POA: Insufficient documentation

## 2016-12-14 ENCOUNTER — Encounter (HOSPITAL_COMMUNITY)
Admission: RE | Admit: 2016-12-14 | Discharge: 2016-12-14 | Disposition: A | Discharge status: Home / Self Care | Payer: Medicare Other | Source: Ambulatory Visit | Attending: Internal Medicine | Admitting: Internal Medicine

## 2016-12-14 DIAGNOSIS — R0600 Dyspnea, unspecified: Secondary | ICD-10-CM

## 2016-12-14 DIAGNOSIS — R0609 Other forms of dyspnea: Principal | ICD-10-CM

## 2016-12-14 NOTE — Progress Notes (Signed)
Pulmonary Individual Treatment Plan  Patient Details  Name: [name] MRN: [id number] Date of Birth: 1929-02-04 Referring Provider:     Pulmonary Rehab Walk Test from 10/05/2016 in [place]  Referring Provider  Dr. [name]      Initial Encounter Date:    Pulmonary Rehab Walk Test from 10/05/2016 in [place]  Date  10/05/16  Referring Provider  Dr. [name]      Visit Diagnosis: Dyspnea on exertion  Patient's Home Medications on Admission:   Current Outpatient Medications:  .  ALPRAZolam (XANAX) 0.25 MG tablet, Take 0.25 mg by mouth 2 (two) times daily as needed for anxiety. Takes 1/2 tablet two times daily., Disp: , Rfl:  .  aspirin 81 MG tablet, Take 81 mg by mouth daily., Disp: , Rfl:  .  atorvastatin (LIPITOR) 20 MG tablet, TAKE 1 TABLET BY MOUTH EVERY DAY, Disp: 90 tablet, Rfl: 2 .  azithromycin (ZITHROMAX) 250 MG tablet, Take 2 pills today then one a day for 4 additional days, Disp: 6 tablet, Rfl: 11 .  b complex vitamins tablet, Take 1 tablet by mouth daily., Disp: , Rfl:  .  Cholecalciferol (VITAMIN D3) 2000 units TABS, Take 2,000 Units by mouth daily., Disp: , Rfl:  .  Cyanocobalamin (VITAMIN B 12 PO), Take 1 tablet daily by mouth. , Disp: , Rfl:  .  finasteride (PROSCAR) 5 MG tablet, Take 5 mg by mouth every morning., Disp: , Rfl:  .  fludrocortisone (FLORINEF) 0.1 MG tablet, Take 0.1 mg by mouth daily as needed (BP < 100 STANDING)., Disp: , Rfl:  .  furosemide (LASIX) 20 MG tablet, Take 20 mg by mouth daily as needed., Disp: , Rfl:  .  guaiFENesin (MUCINEX) 600 MG 12 hr tablet, Take by mouth 2 (two) times daily as needed., Disp: , Rfl:  .  hydrALAZINE (APRESOLINE) 25 MG tablet, Take 1 tablet (25 mg total) by mouth every 12 (twelve) hours as needed (for BP >956 systolic or > 213 diastolic). If your BP is 086 systolic you may take one dose of the hydralazine that you have at home.  But would not do this more than  twice a day., Disp: , Rfl:  .  levalbuterol (XOPENEX) 0.63 MG/3ML nebulizer solution, Take 3 mLs (0.63 mg total) by nebulization every 4 (four) hours as needed for wheezing or shortness of breath., Disp: 300 mL, Rfl: 12 .  loratadine (CLARITIN) 10 MG tablet, Take 10 mg by mouth daily as needed for allergies., Disp: , Rfl:  .  mometasone (NASONEX) 50 MCG/ACT nasal spray, Place 1 spray 2 (two) times daily into the nose., Disp: , Rfl: 2 .  NITROSTAT 0.4 MG SL tablet, DISSOLVE 1 TABLET UNDER THE TONGUE AS DIRECTED AND AS NEEDED FOR CHEST PAIN, Disp: 25 tablet, Rfl: 5 .  OXYGEN, Inhale 2 L continuous into the lungs. , Disp: , Rfl:  .  predniSONE (DELTASONE) 10 MG tablet, TAKE 1/2 TABLET(5 MG) BY MOUTH TWICE DAILY WITH A MEAL, Disp: 30 tablet, Rfl: 2 .  ranitidine (ZANTAC) 150 MG tablet, Take 150 mg by mouth 2 (two) times daily., Disp: , Rfl:  .  Sodium Chloride, Hypertonic, (MURO 128 OP), Place 1 drop 4 (four) times daily into both eyes., Disp: , Rfl:  .  SPIRIVA RESPIMAT 2.5 MCG/ACT AERS, INHALE 2 PUFFS BY MOUTH EVERY DAY, Disp: 4 g, Rfl: 11 .  SYMBICORT 160-4.5 MCG/ACT inhaler, INHALE 2 PUFFS BY MOUTH EVERY MORNING  AND THEN ANOTHER 2 PUFFS ABOUT 12 HOURS LATER, Disp: 1 Inhaler, Rfl: 6 .  TOPROL XL 25 MG 24 hr tablet, TAKE 1 TABLET BY MOUTH DAILY, Disp: 30 tablet, Rfl: 11 .  XOPENEX HFA 45 MCG/ACT inhaler, INHALE 2 PUFFS BY MOUTH EVERY 4 HOURS AS NEEDED FOR WHEEZING OR SHORTNESS OF BREATH, Disp: 15 g, Rfl: 11  Past Medical History: Past Medical History:  Diagnosis Date  . Asthma   . BPH (benign prostatic hyperplasia)    severe; s/p multiple biopsies  . CAD (coronary artery disease)   . Carotid artery occlusion   . Chronic rhinitis   . COPD (chronic obstructive pulmonary disease) (HCC)    2L  O2  . Hyperlipidemia   . Hypertension   . Peripheral vascular disease ([place])   . 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 Syncope and collapse   . Ureteral tumor 08/2015   had endoscopic procedure for evaluation,  unable to reach for biopsy    Tobacco Use: Social History   Tobacco Use  Smoking Status Former Smoker  . Packs/day: 2.00  . Years: 33.00  . Pack years: 66.00  . Types: Cigarettes  . Last attempt to quit: 01/09/1982  . Years since quitting: 34.9  Smokeless Tobacco Never Used    Labs: Recent Chemical engineer    Labs for ITP Cardiac and Pulmonary Rehab Latest Ref Rng & Units 02/23/2010 06/06/2013 06/08/2015 09/13/2015 02/18/2016   Cholestrol 0 - 200 mg/dL - 131 - 135 -   LDLCALC 0 - 99 mg/dL - 58 - 52 -   HDL >40 mg/dL - 62.40 - 74 -   Trlycerides <150 mg/dL - 55.0 - 45 -   Hemoglobin A1c <5.7 % 5.7 (NOTE)                                                                       According to the ADA Clinical Practice Recommendations for 2011, when HbA1c is used as a screening test:   >=6.5%   Diagnostic of Diabetes Mellitus           (if abnormal result is confirmed)  5.7-6.4%   Increased risk of developing Diabetes Mellitus  References:Diagnosis and Classification of Diabetes Mellitus,Diabetes HLKT,6256,38(LHTDS 1):S62-S69 and Standards of Medical Care in         Diabetes - 2011,Diabetes Care,2011,34 (Suppl 1):S11-S61.(H) - - - -   PHART 7.350 - 7.450 - - 7.406 - 7.381   PCO2ART 32.0 - 48.0 mmHg - - 48.6(H) - 47.4   HCO3 20.0 - 28.0 mmol/L - - 29.9(H) - 27.4   TCO2 0 - 100 mmol/L - - 26.7 - -   O2SAT % - - 96.4 - 97.9      Capillary Blood Glucose: Lab Results  Component Value Date   GLUCAP 120 (H) 02/25/2010   GLUCAP 88 02/25/2010   GLUCAP 139 (H) 02/24/2010   GLUCAP 137 (H) 02/24/2010   GLUCAP 108 (H) 02/24/2010     Pulmonary Assessment Scores: Pulmonary Assessment Scores    Row Name 10/04/16 1238 10/05/16 1644       ADL UCSD   ADL Phase  Entry  Entry    SOB Score total  74  -  CAT Score   CAT Score  20 Entry  -      mMRC Score   mMRC Score  -  3       Pulmonary Function Assessment: Pulmonary Function Assessment - 10/02/16 1504      Breath   Bilateral  Breath Sounds  Decreased    Shortness of Breath  Yes;Limiting activity       Exercise Target Goals:    Exercise Program Goal: Individual exercise prescription set with THRR, safety & activity barriers. Participant demonstrates ability to understand and report RPE using BORG scale, to self-measure pulse accurately, and to acknowledge the importance of the exercise prescription.  Exercise Prescription Goal: Starting with aerobic activity 30 plus minutes a day, 3 days per week for initial exercise prescription. Provide home exercise prescription and guidelines that participant acknowledges understanding prior to discharge.  Activity Barriers & Risk Stratification: Activity Barriers & Cardiac Risk Stratification - 10/02/16 1431      Activity Barriers & Cardiac Risk Stratification   Activity Barriers  Deconditioning;Muscular Weakness;Shortness of Breath;Arthritis left shoulder       6 Minute Walk: 6 Minute Walk    Row Name 10/05/16 1637         6 Minute Walk   Phase  Initial     Distance  800 feet     Walk Time  6 minutes     # of Rest Breaks  0     MPH  1.51     METS  2.15     RPE  13     Perceived Dyspnea   3     Symptoms  Yes (comment)     Comments  leg fatigue     Resting HR  106 bpm     Resting BP  138/69     Resting Oxygen Saturation   95 %     Exercise Oxygen Saturation  during 6 min walk  91 %     Max Ex. HR  106 bpm     Max Ex. BP  162/69     2 Minute Post BP  152/71       Interval HR   1 Minute HR  96     2 Minute HR  101     3 Minute HR  101     4 Minute HR  100     5 Minute HR  102     6 Minute HR  102     2 Minute Post HR  98     Interval Heart Rate?  Yes       Interval Oxygen   Interval Oxygen?  Yes     Baseline Oxygen Saturation %  95 %     1 Minute Oxygen Saturation %  96 %     1 Minute Liters of Oxygen  2 L     2 Minute Oxygen Saturation %  93 %     2 Minute Liters of Oxygen  2 L     3 Minute Oxygen Saturation %  93 %     3 Minute Liters of  Oxygen  2 L     4 Minute Oxygen Saturation %  92 %     4 Minute Liters of Oxygen  2 L     5 Minute Oxygen Saturation %  91 %     5 Minute Liters of Oxygen  2 L     6 Minute Oxygen Saturation %  91 %  6 Minute Liters of Oxygen  2 L     2 Minute Post Oxygen Saturation %  93 %     2 Minute Post Liters of Oxygen  2 L        Oxygen Initial Assessment: Oxygen Initial Assessment - 10/05/16 1643      Initial 6 min Walk   Oxygen Used  Continuous;E-Tanks    Liters per minute  2      Program Oxygen Prescription   Program Oxygen Prescription  Continuous;E-Tanks    Liters per minute  2       Oxygen Re-Evaluation: Oxygen Re-Evaluation    Row Name 10/30/16 0755 11/21/16 1010 12/14/16 0929         Program Oxygen Prescription   Program Oxygen Prescription  Continuous;E-Tanks  Continuous;E-Tanks  Continuous;E-Tanks     Liters per minute  2  2  2        Home Oxygen   Home Oxygen Device  -  Liquid Oxygen;Home Concentrator;Portable Concentrator  Liquid Oxygen;Home Concentrator;Portable Concentrator     Sleep Oxygen Prescription  Continuous  Continuous  Continuous     Liters per minute  2  2  2      Home Exercise Oxygen Prescription  Continuous  Continuous  Continuous     Liters per minute  2  2  2      Home at Rest Exercise Oxygen Prescription  Continuous  Continuous  Continuous     Liters per minute  2  2  2      Compliance with Home Oxygen Use  Yes  Yes  Yes       Goals/Expected Outcomes   Short Term Goals  To learn and exhibit compliance with exercise, home and travel O2 prescription;To learn and understand importance of monitoring SPO2 with pulse oximeter and demonstrate accurate use of the pulse oximeter.;To learn and demonstrate proper pursed lip breathing techniques or other breathing techniques.;To learn and understand importance of maintaining oxygen saturations>88%;To learn and demonstrate proper use of respiratory medications  To learn and exhibit compliance with exercise, home  and travel O2 prescription;To learn and understand importance of monitoring SPO2 with pulse oximeter and demonstrate accurate use of the pulse oximeter.;To learn and demonstrate proper pursed lip breathing techniques or other breathing techniques.;To learn and understand importance of maintaining oxygen saturations>88%;To learn and demonstrate proper use of respiratory medications  To learn and exhibit compliance with exercise, home and travel O2 prescription     Long  Term Goals  Exhibits compliance with exercise, home and travel O2 prescription;Maintenance of O2 saturations>88%;Compliance with respiratory medication;Verbalizes importance of monitoring SPO2 with pulse oximeter and return demonstration;Exhibits proper breathing techniques, such as pursed lip breathing or other method taught during program session;Demonstrates proper use of MDI's  Exhibits compliance with exercise, home and travel O2 prescription;Maintenance of O2 saturations>88%;Compliance with respiratory medication;Verbalizes importance of monitoring SPO2 with pulse oximeter and return demonstration;Exhibits proper breathing techniques, such as pursed lip breathing or other method taught during program session;Demonstrates proper use of MDI's  Exhibits compliance with exercise, home and travel O2 prescription     Comments  patient compliant with home O2 use. observed using his pulse oximeter to monitor his SPO2. He uses PLB without cueing  patient compliant with home O2 use. observed using his pulse oximeter to monitor his SPO2. He uses PLB without cueing  continues to be compliant with oxygen use     Goals/Expected Outcomes  -  continued compliance with oxygen useage  continued compliance  Oxygen Discharge (Final Oxygen Re-Evaluation): Oxygen Re-Evaluation - 12/14/16 0929      Program Oxygen Prescription   Program Oxygen Prescription  Continuous;E-Tanks    Liters per minute  2      Home Oxygen   Home Oxygen Device  Liquid  Oxygen;Home Concentrator;Portable Concentrator    Sleep Oxygen Prescription  Continuous    Liters per minute  2    Home Exercise Oxygen Prescription  Continuous    Liters per minute  2    Home at Rest Exercise Oxygen Prescription  Continuous    Liters per minute  2    Compliance with Home Oxygen Use  Yes      Goals/Expected Outcomes   Short Term Goals  To learn and exhibit compliance with exercise, home and travel O2 prescription    Long  Term Goals  Exhibits compliance with exercise, home and travel O2 prescription    Comments  continues to be compliant with oxygen use    Goals/Expected Outcomes  continued compliance       Initial Exercise Prescription: Initial Exercise Prescription - 10/05/16 1600      Date of Initial Exercise RX and Referring Provider   Date  10/05/16    Referring Provider  Dr. [name]      Oxygen   Oxygen  Continuous    Liters  2      Recumbant Bike   Level  1    Minutes  17      NuStep   Level  1    Minutes  17    METs  1.2      Track   Laps  3    Minutes  17      Prescription Details   Frequency (times per week)  2    Duration  Progress to 45 minutes of aerobic exercise without signs/symptoms of physical distress      Intensity   THRR 40-80% of Max Heartrate  54-108    Ratings of Perceived Exertion  11-13    Perceived Dyspnea  0-4      Progression   Progression  Continue progressive overload as per policy without signs/symptoms or physical distress.      Resistance Training   Training Prescription  Yes    Weight  orange bands    Reps  10-15       Perform Capillary Blood Glucose checks as needed.  Exercise Prescription Changes: Exercise Prescription Changes    Row Name 10/12/16 1600 10/17/16 1600 11/02/16 1500 11/16/16 1552 11/21/16 1500     Response to Exercise   Blood Pressure (Admit)  152/74  134/76  128/56  124/60  148/78   Blood Pressure (Exercise)  170/72  154/72  142/76  140/70  142/80   Blood Pressure (Exit)  158/80   120/70  126/64  122/60  140/70   Heart Rate (Admit)  85 bpm  92 bpm  75 bpm  74 bpm  83 bpm   Heart Rate (Exercise)  94 bpm  94 bpm  88 bpm  92 bpm  91 bpm   Heart Rate (Exit)  82 bpm  76 bpm  85 bpm  79 bpm  65 bpm   Oxygen Saturation (Admit)  97 %  91 %  94 %  94 %  93 %   Oxygen Saturation (Exercise)  94 %  94 %  97 %  94 %  89 %   Oxygen Saturation (Exit)  99 %  99 %  99 %  100 %  99 %   Rating of Perceived Exertion (Exercise)  13  15  13  15  15    Perceived Dyspnea (Exercise)  4  3  3  3  4    Duration  Progress to 45 minutes of aerobic exercise without signs/symptoms of physical distress  Progress to 45 minutes of aerobic exercise without signs/symptoms of physical distress  Progress to 45 minutes of aerobic exercise without signs/symptoms of physical distress  Progress to 45 minutes of aerobic exercise without signs/symptoms of physical distress  Progress to 45 minutes of aerobic exercise without signs/symptoms of physical distress   Intensity  Other (comment) 40-80% of HRR  THRR unchanged  THRR unchanged  THRR unchanged  THRR unchanged     Progression   Progression  Continue to progress workloads to maintain intensity without signs/symptoms of physical distress.  Continue to progress workloads to maintain intensity without signs/symptoms of physical distress.  Continue to progress workloads to maintain intensity without signs/symptoms of physical distress.  Continue to progress workloads to maintain intensity without signs/symptoms of physical distress.  Continue to progress workloads to maintain intensity without signs/symptoms of physical distress.     Resistance Training   Training Prescription  Yes  Yes  Yes  Yes  Yes   Weight  orange bands  orange bands  orange bands  orange bands  orange bands   Reps  10-15  10-15  10-15  10-15  10-15   Time  10 Minutes  10 Minutes  10 Minutes  10 Minutes  10 Minutes     Oxygen   Oxygen  Continuous  Continuous  Continuous  Continuous  Continuous    Liters  2  2  2  2  2      Recumbant Bike   Level  1  1  -  1  -   Minutes  17  17  -  17  -     NuStep   Level  -  -  1  -  1   Minutes  -  -  17  -  34   METs  -  -  1.4  -  1.8     Track   Laps  8  6  7  5  4    Minutes  17  17  17  17  17      Home Exercise Plan   Plans to continue exercise at  -  -  -  -  Home (comment)   Frequency  -  -  -  -  Add 2 additional days to program exercise sessions.   Row Name 11/23/16 1500 11/28/16 1500 12/05/16 1500         Response to Exercise   Blood Pressure (Admit)  144/70  134/72  144/70     Blood Pressure (Exercise)  128/54  150/80  136/54     Blood Pressure (Exit)  130/72  132/70  110/66     Heart Rate (Admit)  98 bpm  79 bpm  86 bpm     Heart Rate (Exercise)  95 bpm  91 bpm  94 bpm     Heart Rate (Exit)  79 bpm  73 bpm  83 bpm     Oxygen Saturation (Admit)  94 %  97 %  96 %     Oxygen Saturation (Exercise)  92 %  96 %  93 %     Oxygen Saturation (Exit)  100 %  98 %  98 %     Rating of Perceived Exertion (Exercise)  15  13  17      Perceived Dyspnea (Exercise)  2  3  3      Duration  Progress to 45 minutes of aerobic exercise without signs/symptoms of physical distress  Progress to 45 minutes of aerobic exercise without signs/symptoms of physical distress  Progress to 45 minutes of aerobic exercise without signs/symptoms of physical distress     Intensity  THRR unchanged  THRR unchanged  THRR unchanged       Progression   Progression  Continue to progress workloads to maintain intensity without signs/symptoms of physical distress.  Continue to progress workloads to maintain intensity without signs/symptoms of physical distress.  Continue to progress workloads to maintain intensity without signs/symptoms of physical distress.       Resistance Training   Training Prescription  Yes  Yes  Yes     Weight  orange bands  orange bands  orange bands     Reps  10-15  10-15  10-15     Time  10 Minutes  10 Minutes  10 Minutes       Oxygen    Oxygen  Continuous  Continuous  Continuous     Liters  2  2  2        Recumbant Bike   Level  -  4  1     Minutes  -  17  17       NuStep   Level  1  2  2      Minutes  15  17  17      METs  1.1  1.2  1.3       Track   Laps  5  6  5      Minutes  17  17  17        Home Exercise Plan   Plans to continue exercise at  Home (comment)  -  -     Frequency  Add 2 additional days to program exercise sessions.  -  -        Exercise Comments: Exercise Comments    Row Name 11/23/16 [phone]           Exercise Comments  Home exercise completed          Exercise Goals and Review: Exercise Goals    Row Name 10/02/16 1435             Exercise Goals   Increase Physical Activity  Yes       Intervention  Provide advice, education, support and counseling about physical activity/exercise needs.;Develop an individualized exercise prescription for aerobic and resistive training based on initial evaluation findings, risk stratification, comorbidities and participant's personal goals.       Expected Outcomes  Achievement of increased cardiorespiratory fitness and enhanced flexibility, muscular endurance and strength shown through measurements of functional capacity and personal statement of participant.       Increase Strength and Stamina  Yes       Intervention  Provide advice, education, support and counseling about physical activity/exercise needs.;Develop an individualized exercise prescription for aerobic and resistive training based on initial evaluation findings, risk stratification, comorbidities and participant's personal goals.       Expected Outcomes  Achievement of increased cardiorespiratory fitness and enhanced flexibility, muscular endurance and strength shown through measurements of functional capacity and personal statement of participant.       Able  to understand and use rate of perceived exertion (RPE) scale  Yes       Intervention  Provide education and explanation on how to use  RPE scale       Expected Outcomes  Short Term: Able to use RPE daily in rehab to express subjective intensity level;Long Term:  Able to use RPE to guide intensity level when exercising independently       Able to understand and use Dyspnea scale  Yes       Intervention  Provide education and explanation on how to use Dyspnea scale       Expected Outcomes  Short Term: Able to use Dyspnea scale daily in rehab to express subjective sense of shortness of breath during exertion;Long Term: Able to use Dyspnea scale to guide intensity level when exercising independently       Knowledge and understanding of Target Heart Rate Range (THRR)  Yes       Intervention  Provide education and explanation of THRR including how the numbers were predicted and where they are located for reference       Expected Outcomes  Short Term: Able to state/look up THRR;Long Term: Able to use THRR to govern intensity when exercising independently;Short Term: Able to use daily as guideline for intensity in rehab       Understanding of Exercise Prescription  Yes       Intervention  Provide education, explanation, and written materials on patient's individual exercise prescription       Expected Outcomes  Short Term: Able to explain program exercise prescription;Long Term: Able to explain home exercise prescription to exercise independently          Exercise Goals Re-Evaluation : Exercise Goals Re-Evaluation    Row Name 10/28/16 1340 11/20/16 1647 12/11/16 1526         Exercise Goal Re-Evaluation   Exercise Goals Review  Increase Physical Activity;Increase Strength and Stamina;Able to understand and use rate of perceived exertion (RPE) scale;Knowledge and understanding of Target Heart Rate Range (THRR);Understanding of Exercise Prescription;Able to understand and use Dyspnea scale  Increase Strength and Stamina;Increase Physical Activity;Able to understand and use Dyspnea scale;Able to understand and use rate of perceived  exertion (RPE) scale;Knowledge and understanding of Target Heart Rate Range (THRR);Understanding of Exercise Prescription  Increase Strength and Stamina;Increase Physical Activity;Able to understand and use Dyspnea scale;Able to understand and use rate of perceived exertion (RPE) scale;Knowledge and understanding of Target Heart Rate Range (THRR);Understanding of Exercise Prescription     Comments  Patient has only attended two exercise sessions. Will cont. to monitor and progress as able.   Patient is very deconditioned. Has a hard time getting to rehab. Has been out with URI. Has only attended 4 sessions. Will cont. to monitor and progress as able.   Patient is very deconditioned. Has a hard time getting to rehab. We have discussed his sedentary life. Wife preforms most of the ADL's for him. He states he understands the importance of staying active. Will cont. to progress and motivate as able.      Expected Outcomes  Through exercising at rehab and at home, patient will increase physical strength and stamina and find ADL's easier to perform.   Through exercise at rehab and at home, patient will increase strength and stamina and will find that ADL's are easier to preform.   Through exercise at rehab and at home, patient will increase strength and stamina and find that ADL's are easier to preform.  Discharge Exercise Prescription (Final Exercise Prescription Changes): Exercise Prescription Changes - 12/05/16 1500      Response to Exercise   Blood Pressure (Admit)  144/70    Blood Pressure (Exercise)  136/54    Blood Pressure (Exit)  110/66    Heart Rate (Admit)  86 bpm    Heart Rate (Exercise)  94 bpm    Heart Rate (Exit)  83 bpm    Oxygen Saturation (Admit)  96 %    Oxygen Saturation (Exercise)  93 %    Oxygen Saturation (Exit)  98 %    Rating of Perceived Exertion (Exercise)  17    Perceived Dyspnea (Exercise)  3    Duration  Progress to 45 minutes of aerobic exercise without  signs/symptoms of physical distress    Intensity  THRR unchanged      Progression   Progression  Continue to progress workloads to maintain intensity without signs/symptoms of physical distress.      Resistance Training   Training Prescription  Yes    Weight  orange bands    Reps  10-15    Time  10 Minutes      Oxygen   Oxygen  Continuous    Liters  2      Recumbant Bike   Level  1    Minutes  17      NuStep   Level  2    Minutes  17    METs  1.3      Track   Laps  5    Minutes  17       Nutrition:  Target Goals: Understanding of nutrition guidelines, daily intake of sodium <1585m, cholesterol <2062m calories 30% from fat and 7% or less from saturated fats, daily to have 5 or more servings of fruits and vegetables.  Biometrics:    Nutrition Therapy Plan and Nutrition Goals: Nutrition Therapy & Goals - 10/19/16 1435      Nutrition Therapy   Diet  High Calorie, High Protein       Personal Nutrition Goals   Nutrition Goal  The pt will recognize symptoms that can interfere with adequate oral intake, such as shortness of breath, N/V, early satiety, fatigue, ability to secure and prepare food,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

## 2016-12-15 ENCOUNTER — Ambulatory Visit (INDEPENDENT_AMBULATORY_CARE_PROVIDER_SITE_OTHER): Payer: Medicare Other | Admitting: Adult Health

## 2016-12-15 ENCOUNTER — Encounter: Payer: Self-pay | Admitting: Adult Health

## 2016-12-15 DIAGNOSIS — J9611 Chronic respiratory failure with hypoxia: Secondary | ICD-10-CM | POA: Diagnosis not present

## 2016-12-15 DIAGNOSIS — J449 Chronic obstructive pulmonary disease, unspecified: Secondary | ICD-10-CM

## 2016-12-15 DIAGNOSIS — J9612 Chronic respiratory failure with hypercapnia: Secondary | ICD-10-CM | POA: Diagnosis not present

## 2016-12-15 MED ORDER — TIOTROPIUM BROMIDE MONOHYDRATE 2.5 MCG/ACT IN AERS: 2.0000 | INHALATION_SPRAY | Freq: Every day | RESPIRATORY_TRACT | 0 refills | Status: DC

## 2016-12-15 MED ORDER — DM-GUAIFENESIN ER 30-600 MG PO TB12: 1.0000 | ORAL_TABLET | Freq: Two times a day (BID) | ORAL | 0 refills | Status: DC

## 2016-12-15 NOTE — Patient Instructions (Signed)
Mucinex DM Twice daily  As needed  Cough/congestion with flutter valve.  Saline nasal rinses As needed   Continue on Trilogy At bedtime.  May use Xopenex every 4hr as needed.  Follow up with Dr. [name]  In 2-3  months and As needed   Please contact office for sooner follow up if symptoms do not improve or worsen or seek emergency care

## 2016-12-15 NOTE — Progress Notes (Signed)
@Patient  ID: [name], male    DOB: November 10, 1929, 83 y.o.   MRN: [id number]  Chief Complaint  Patient presents with  . Follow-up    COPD     Referring provider: [name], MD  HPI: 83 yo male former smoker (1983) followed for GOLD III COPD w/ asthma component , O2 dependent RF with act and At bedtime    TEST /Events  PFTs 05/04/05 FEV1 36% ratio 28% with 29% improvement after bronchodilators DLCO 48%  - PFT's 03/26/08 30% ratio 34 with 14% improvement after bronchodilators DLC0 38 %  - Referred to rehab 12/19/2012 >completed March 2015  - changed to spiriva respimat 02/14/2013  - started daily prednisone 06/19/13 >improved only a little>tapered off mid July 2015 >ok to change to prn prednisone 08/27/2013  - flared off nasonex and gerd rx 09/21/2014 > resumed - 10/19/2014 p extensive coaching HFA effectiveness = 90%  - changed pred to ceiling of 20 / floor of 5 mg daily as of 10/19/2014 >changed to floor of 10 mg daily 10/04/2015  - PFT's 06/08/2015 FEV1 0.58 (22 % ) ratio 27 p No sign % improvement from saba p symb/spiriva prior to study with DLCO 9/9 % corrects to 27 % for alv volume  Start noct 02 at 2lpm 2008 and on 03/04/09 desat @ >185 ft so rec wear with activtiy >rm to rm  - 2/6/2013Walked RA x one lap @ 185 stopped due to desat >corrected on 2lpm - 02/26/2014 Walked 2lpm 2 laps @ 185 ft each stopped due to Sob/ sats 88% at nl pace  - 10/10/2016Walked RA 2 laps @ 185 ft each stopped due to End of study, slow pace, sob But no desat  - HCO3 10/27/15 = 34   12/15/2016 Follow up : COPD/O2 RF  Patient returns for a 6-week follow-up since last visit patient says he feels he is doing some better.  Last visit patient was having increased cough and shortness of breath.  Prednisone was increased to 20 mg for a brief time but then back to baseline at 10 mg.  Patient says he has some lingering congestion and sinus drainage but overall is better.   Patient had stopped using his trilogy machine at night.  But felt that it did help and so he has restarted this. He remains on oxygen 2 L.  He denies any hemoptysis chest pain orthopnea PND or increased leg swelling   Allergies  Allergen Reactions  . Sulfonamide Derivatives Swelling    REACTION: facial/tongue swelling  . Cefdinir Diarrhea    REACTION: diarrhea  . Ciprofloxacin Itching and Rash    Red itchy hands  . Nitrofurantoin Swelling    Swollen hands  . Levaquin [Levofloxacin] Itching and Rash    Immunization History  Administered Date(s) Administered  . Influenza Split 10/10/2010, 10/04/2011, 10/14/2013  . Influenza Whole 10/01/2007, 11/06/2008, 11/09/2009  . Influenza, High Dose Seasonal PF 11/07/2016  . Influenza,inj,Quad PF,6+ Mos 09/30/2012, 10/19/2014  . Influenza-Unspecified 09/07/2015  . Pneumococcal Conjugate-13 08/27/2013  . Pneumococcal Polysaccharide-23 12/11/2001, 05/20/2015    Past Medical History:  Diagnosis Date  . Asthma   . BPH (benign prostatic hyperplasia)    severe; s/p multiple biopsies  . CAD (coronary artery disease)   . Carotid artery occlusion   . Chronic rhinitis   . COPD (chronic obstructive pulmonary disease) (HCC)    2L [place] O2  . Hyperlipidemia   . Hypertension   . Peripheral vascular disease ([place])   .  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 Syncope and collapse   . Ureteral tumor 08/2015   had endoscopic procedure for evaluation, unable to reach for biopsy    Tobacco History: Social History   Tobacco Use  Smoking Status Former Smoker  . Packs/day: 2.00  . Years: 33.00  . Pack years: 66.00  . Types: Cigarettes  . Last attempt to quit: 01/09/1982  . Years since quitting: 34.9  Smokeless Tobacco Never Used   Counseling given: Not Answered   Outpatient Encounter Medications as of 12/15/2016  Medication Sig  . ALPRAZolam (XANAX) 0.25 MG tablet Take 0.25 mg by mouth 2 (two) times daily as needed for anxiety. Takes 1/2 tablet two times daily.    Marland Kitchen aspirin 81 MG tablet Take 81 mg by mouth daily.  Marland Kitchen atorvastatin (LIPITOR) 20 MG tablet TAKE 1 TABLET BY MOUTH EVERY DAY  . azithromycin (ZITHROMAX) 250 MG tablet Take 2 pills today then one a day for 4 additional days  . b complex vitamins tablet Take 1 tablet by mouth daily.  . Cholecalciferol (VITAMIN D3) 2000 units TABS Take 2,000 Units by mouth daily.  . Cyanocobalamin (VITAMIN B 12 PO) Take 1 tablet daily by mouth.   . finasteride (PROSCAR) 5 MG tablet Take 5 mg by mouth every morning.  . fludrocortisone (FLORINEF) 0.1 MG tablet Take 0.1 mg by mouth daily as needed (BP < 100 STANDING).  . furosemide (LASIX) 20 MG tablet Take 20 mg by mouth daily as needed.  Marland Kitchen guaiFENesin (MUCINEX) 600 MG 12 hr tablet Take by mouth 2 (two) times daily as needed.  . hydrALAZINE (APRESOLINE) 25 MG tablet Take 1 tablet (25 mg total) by mouth every 12 (twelve) hours as needed (for BP >782 systolic or > 956 diastolic). If your BP is 213 systolic you may take one dose of the hydralazine that you have at home.  But would not do this more than twice a day.  . levalbuterol (XOPENEX) 0.63 MG/3ML nebulizer solution Take 3 mLs (0.63 mg total) by nebulization every 4 (four) hours as needed for wheezing or shortness of breath.  . loratadine (CLARITIN) 10 MG tablet Take 10 mg by mouth daily as needed for allergies.  . mometasone (NASONEX) 50 MCG/ACT nasal spray Place 1 spray 2 (two) times daily into the nose.  Marland Kitchen NITROSTAT 0.4 MG SL tablet DISSOLVE 1 TABLET UNDER THE TONGUE AS DIRECTED AND AS NEEDED FOR CHEST PAIN  . OXYGEN Inhale 2 L continuous into the lungs.   . predniSONE (DELTASONE) 10 MG tablet TAKE 1/2 TABLET(5 MG) BY MOUTH TWICE DAILY WITH A MEAL  . ranitidine (ZANTAC) 150 MG tablet Take 150 mg by mouth 2 (two) times daily.  . Sodium Chloride, Hypertonic, (MURO 128 OP) Place 1 drop 4 (four) times daily into both eyes.  Marland Kitchen SPIRIVA RESPIMAT 2.5 MCG/ACT AERS INHALE 2 PUFFS BY MOUTH EVERY DAY  . SYMBICORT 160-4.5  MCG/ACT inhaler INHALE 2 PUFFS BY MOUTH EVERY MORNING AND THEN ANOTHER 2 PUFFS ABOUT 12 HOURS LATER  . TOPROL XL 25 MG 24 hr tablet TAKE 1 TABLET BY MOUTH DAILY  . XOPENEX HFA 45 MCG/ACT inhaler INHALE 2 PUFFS BY MOUTH EVERY 4 HOURS AS NEEDED FOR WHEEZING OR SHORTNESS OF BREATH   No facility-administered encounter medications on file as of 12/15/2016.      Review of Systems  Constitutional:   No  weight loss, night sweats,  Fevers, chills,  +fatigue, or  lassitude.  HEENT:   No headaches,  Difficulty swallowing,  Tooth/dental problems, or  Sore throat,                No sneezing, itching, ear ache, nasal congestion, post nasal drip,   CV:  No chest pain,  Orthopnea, PND, swelling in lower extremities, anasarca, dizziness, palpitations, syncope.   GI  No heartburn, indigestion, abdominal pain, nausea, vomiting, diarrhea, change in bowel habits, loss of appetite, bloody stools.   Resp:   No chest wall deformity  Skin: no rash or lesions.  GU: no dysuria, change in color of urine, no urgency or frequency.  No flank pain, no hematuria   MS:  No joint pain or swelling.  No decreased range of motion.  No back pain.    Physical Exam  BP 138/84 (BP Location: Left Arm, Cuff Size: Normal)   Pulse 95   Ht 5\' 9"  (1.753 m)   Wt 134 lb 3.2 oz (60.9 kg)   SpO2 92%   BMI 19.82 kg/m   GEN: A/Ox3; pleasant , NAD,  elderly on oxygen   HEENT:  [place]/AT,  EACs-clear, TMs-wnl, NOSE-clear, THROAT-clear, no lesions, no postnasal drip or exudate noted.   NECK:  Supple w/ fair ROM; no JVD; normal carotid impulses w/o bruits; no thyromegaly or nodules palpated; no lymphadenopathy.    RESP decreased breath sounds in the bases  no accessory muscle use, no dullness to percussion  CARD:  RRR, no m/r/g, no peripheral edema, pulses intact, no cyanosis or clubbing.  GI:   Soft & nt; nml bowel sounds; no organomegaly or masses detected.   Musco: Warm bil, no deformities or joint swelling noted.    Neuro: alert, no focal deficits noted.    Skin: Warm, no lesions or rashes    Lab Results:  CBC     Imaging: US Abdomen Complete  Result Date: 12/06/2016 CLINICAL DATA:  Right upper quadrant pain EXAM: ABDOMEN ULTRASOUND COMPLETE COMPARISON:  05/10/2016 CT FINDINGS: Gallbladder: No gallstones or wall thickening visualized. No sonographic Murphy sign noted by sonographer. Common bile duct: Diameter: 4.8 mm distally Liver: No focal lesion identified. Within normal limits in parenchymal echogenicity. Portal vein is patent on color Doppler imaging with normal direction of blood flow towards the liver. IVC: No abnormality visualized. Pancreas: Visualized portion unremarkable. Spleen: Size and appearance within normal limits. Right Kidney: Length: 10.2 cm. Normal echogenicity. No mass or hydronephrosis. Echogenic shadowing nonobstructing calculus in the lower pole measuring 1.4 cm Left Kidney: Length: 10.1 cm. Echogenicity within normal limits. No mass or hydronephrosis visualized. Abdominal aorta: Negative for aneurysm.  Atherosclerosis noted. Other findings: No ascites IMPRESSION: No acute finding by abdominal ultrasound Negative for gallstones or biliary dilatation 1.4 cm right kidney lower pole intrarenal calculus without obstruction Aortic atherosclerosis Electronically Signed   By: [name].  Shick M.D.   On: 12/06/2016 15:37     Assessment & Plan:   No problem-specific Assessment & Plan notes found for this encounter.     Rexene Edison, NP 12/15/2016

## 2016-12-19 ENCOUNTER — Encounter (HOSPITAL_COMMUNITY): Payer: Medicare Other

## 2016-12-19 NOTE — Assessment & Plan Note (Signed)
Continue on oxygen Continue on trilogy vent at bedtime.

## 2016-12-19 NOTE — Assessment & Plan Note (Signed)
Improved symptom control and appears to be back to his baseline   Plan  Patient Instructions  Mucinex DM Twice daily  As needed  Cough/congestion with flutter valve.  Saline nasal rinses As needed   Continue on Trilogy At bedtime.  May use Xopenex every 4hr as needed.  Follow up with Dr. [name]  In 2-3  months and As needed   Please contact office for sooner follow up if symptoms do not improve or worsen or seek emergency care

## 2016-12-20 DIAGNOSIS — H40053 Ocular hypertension, bilateral: Secondary | ICD-10-CM | POA: Diagnosis not present

## 2016-12-20 DIAGNOSIS — H1851 Endothelial corneal dystrophy: Secondary | ICD-10-CM | POA: Diagnosis not present

## 2016-12-20 DIAGNOSIS — H26493 Other secondary cataract, bilateral: Secondary | ICD-10-CM | POA: Diagnosis not present

## 2016-12-20 DIAGNOSIS — Z961 Presence of intraocular lens: Secondary | ICD-10-CM | POA: Diagnosis not present

## 2016-12-20 DIAGNOSIS — H17823 Peripheral opacity of cornea, bilateral: Secondary | ICD-10-CM | POA: Diagnosis not present

## 2016-12-21 ENCOUNTER — Encounter (HOSPITAL_COMMUNITY): Payer: Medicare Other

## 2016-12-21 NOTE — Addendum Note (Signed)
Addended by: Parke Poisson E on: 12/21/2016 04:43 PM   Modules accepted: Orders

## 2016-12-22 ENCOUNTER — Other Ambulatory Visit: Payer: Self-pay | Admitting: Internal Medicine

## 2016-12-26 ENCOUNTER — Encounter (HOSPITAL_COMMUNITY): Payer: Medicare Other

## 2016-12-28 ENCOUNTER — Encounter (HOSPITAL_COMMUNITY)
Admission: RE | Admit: 2016-12-28 | Discharge: 2016-12-28 | Disposition: A | Discharge status: Home / Self Care | Payer: Medicare Other | Source: Ambulatory Visit | Attending: Internal Medicine | Admitting: Internal Medicine

## 2016-12-29 ENCOUNTER — Telehealth (HOSPITAL_COMMUNITY): Payer: Self-pay | Admitting: Family Medicine

## 2017-01-04 ENCOUNTER — Telehealth (HOSPITAL_COMMUNITY): Payer: Self-pay | Admitting: *Deleted

## 2017-01-04 ENCOUNTER — Encounter (HOSPITAL_COMMUNITY)
Admission: RE | Admit: 2017-01-04 | Discharge: 2017-01-04 | Disposition: A | Discharge status: Home / Self Care | Payer: Medicare Other | Source: Ambulatory Visit | Attending: Internal Medicine | Admitting: Internal Medicine

## 2017-01-04 DIAGNOSIS — R0609 Other forms of dyspnea: Principal | ICD-10-CM

## 2017-01-04 DIAGNOSIS — R0600 Dyspnea, unspecified: Secondary | ICD-10-CM

## 2017-01-04 NOTE — Progress Notes (Signed)
Pulmonary Individual Treatment Plan  Patient Details  Name: [name] MRN: [id number] Date of Birth: 06/02/1929 Referring Provider:     Pulmonary Rehab Walk Test from 10/05/2016 in [place] [facility]  Referring Provider  Dr. [name]      Initial Encounter Date:    Pulmonary Rehab Walk Test from 10/05/2016 in [facility]  Date  10/05/16  Referring Provider  Dr. [name]      Visit Diagnosis: Dyspnea on exertion  Patient's Home Medications on Admission:   Current Outpatient Medications:  .  ALPRAZolam (XANAX) 0.25 MG tablet, Takes 1/2 tablet two times daily., Disp: , Rfl:  .  aspirin 81 MG tablet, Take 81 mg by mouth daily., Disp: , Rfl:  .  atorvastatin (LIPITOR) 20 MG tablet, TAKE 1 TABLET BY MOUTH EVERY DAY, Disp: 90 tablet, Rfl: 2 .  azithromycin (ZITHROMAX) 250 MG tablet, Take 2 pills today then one a day for 4 additional days, Disp: 6 tablet, Rfl: 11 .  b complex vitamins tablet, Take 1 tablet by mouth daily., Disp: , Rfl:  .  cetirizine (ZYRTEC) 10 MG tablet, Take 10 mg by mouth daily as needed for allergies., Disp: , Rfl:  .  Cholecalciferol (VITAMIN D3) 2000 units TABS, Take 2,000 Units by mouth daily., Disp: , Rfl:  .  Cyanocobalamin (VITAMIN B 12 PO), Take 1 tablet daily by mouth. , Disp: , Rfl:  .  finasteride (PROSCAR) 5 MG tablet, Take 5 mg by mouth every morning., Disp: , Rfl:  .  fludrocortisone (FLORINEF) 0.1 MG tablet, 1/2 tab by mouth as needed for low blood pressure <100, Disp: , Rfl:  .  furosemide (LASIX) 20 MG tablet, Take 20 mg by mouth daily as needed., Disp: , Rfl:  .  guaiFENesin (MUCINEX) 600 MG 12 hr tablet, Take by mouth 2 (two) times daily as needed., Disp: , Rfl:  .  hydrALAZINE (APRESOLINE) 25 MG tablet, 1 every 6 hours as needed for high blood pressure > 180, Disp: , Rfl:  .  mometasone (NASONEX) 50 MCG/ACT nasal spray, 2 puffs into the nose in the morning and 1-2 puffs at bedtime, Disp: , Rfl: 2 .   mometasone (NASONEX) 50 MCG/ACT nasal spray, SHAKE LIQUID AND USE 2 SPRAYS IN EACH NOSTRIL DAILY, Disp: 17 g, Rfl: 11 .  Multiple Vitamins-Minerals (PRESERVISION AREDS 2) CAPS, Take 1 capsule by mouth daily., Disp: , Rfl:  .  NITROSTAT 0.4 MG SL tablet, DISSOLVE 1 TABLET UNDER THE TONGUE AS DIRECTED AND AS NEEDED FOR CHEST PAIN, Disp: 25 tablet, Rfl: 5 .  OXYGEN, Inhale 2 L continuous into the lungs. , Disp: , Rfl:  .  predniSONE (DELTASONE) 10 MG tablet, Take 10 mg by mouth daily with breakfast. If having flare of cough/wheezing: May take 2 daily until better, then 1 daily, Disp: , Rfl:  .  ranitidine (ZANTAC) 150 MG tablet, Take 150 mg by mouth 2 (two) times daily., Disp: , Rfl:  .  Sodium Chloride, Hypertonic, (MURO 128 OP), Place 1 drop 4 (four) times daily into both eyes., Disp: , Rfl:  .  SPIRIVA RESPIMAT 2.5 MCG/ACT AERS, INHALE 2 PUFFS BY MOUTH EVERY DAY, Disp: 4 g, Rfl: 11 .  SYMBICORT 160-4.5 MCG/ACT inhaler, INHALE 2 PUFFS BY MOUTH EVERY MORNING AND THEN ANOTHER 2 PUFFS ABOUT 12 HOURS LATER, Disp: 1 Inhaler, Rfl: 6 .  TOPROL XL 25 MG 24 hr tablet, TAKE 1 TABLET BY MOUTH DAILY, Disp: 30 tablet, Rfl:  11 .  XOPENEX HFA 45 MCG/ACT inhaler, INHALE 2 PUFFS BY MOUTH EVERY 4 HOURS AS NEEDED FOR WHEEZING OR SHORTNESS OF BREATH, Disp: 15 g, Rfl: 11  Past Medical History: Past Medical History:  Diagnosis Date  . Asthma   . BPH (benign prostatic hyperplasia)    severe; s/p multiple biopsies  . CAD (coronary artery disease)   . Carotid artery occlusion   . Chronic rhinitis   . COPD (chronic obstructive pulmonary disease) (HCC)    2L [place] O2  . Hyperlipidemia   . Hypertension   . Peripheral vascular disease ([facility])   . 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 Syncope and collapse   . Ureteral tumor 08/2015   had endoscopic procedure for evaluation, unable to reach for biopsy    Tobacco Use: Social History   Tobacco Use  Smoking Status Former Smoker  . Packs/day: 2.00  . Years: 33.00  . Pack years:  66.00  . Types: Cigarettes  . Last attempt to quit: 01/09/1982  . Years since quitting: 35.0  Smokeless Tobacco Never Used    Labs: Recent Review Scientist, physiological    Labs for ITP Cardiac and Pulmonary Rehab Latest Ref Rng & Units 02/23/2010 06/06/2013 06/08/2015 09/13/2015 02/18/2016   Cholestrol 0 - 200 mg/dL - 131 - 135 -   LDLCALC 0 - 99 mg/dL - 58 - 52 -   HDL >40 mg/dL - 62.40 - 74 -   Trlycerides <150 mg/dL - 55.0 - 45 -   Hemoglobin A1c <5.7 % 5.7 (NOTE)                                                                       According to the ADA Clinical Practice Recommendations for 2011, when HbA1c is used as a screening test:   >=6.5%   Diagnostic of Diabetes Mellitus           (if abnormal result is confirmed)  5.7-6.4%   Increased risk of developing Diabetes Mellitus  References:Diagnosis and Classification of Diabetes Mellitus,Diabetes NWGN,5621,30(QMVHQ 1):S62-S69 and Standards of Medical Care in         Diabetes - 2011,Diabetes Care,2011,34 (Suppl 1):S11-S61.(H) - - - -   PHART 7.350 - 7.450 - - 7.406 - 7.381   PCO2ART 32.0 - 48.0 mmHg - - 48.6(H) - 47.4   HCO3 20.0 - 28.0 mmol/L - - 29.9(H) - 27.4   TCO2 0 - 100 mmol/L - - 26.7 - -   O2SAT % - - 96.4 - 97.9      Capillary Blood Glucose: Lab Results  Component Value Date   GLUCAP 120 (H) 02/25/2010   GLUCAP 88 02/25/2010   GLUCAP 139 (H) 02/24/2010   GLUCAP 137 (H) 02/24/2010   GLUCAP 108 (H) 02/24/2010     Pulmonary Assessment Scores: Pulmonary Assessment Scores    Row Name 10/04/16 1238 10/05/16 1644       ADL UCSD   ADL Phase  Entry  Entry    SOB Score total  74  -      CAT Score   CAT Score  20 Entry  -      mMRC Score   mMRC Score  -  3  Pulmonary Function Assessment: Pulmonary Function Assessment - 10/02/16 1504      Breath   Bilateral Breath Sounds  Decreased    Shortness of Breath  Yes;Limiting activity       Exercise Target Goals:    Exercise Program Goal: Individual exercise  prescription set with THRR, safety & activity barriers. Participant demonstrates ability to understand and report RPE using BORG scale, to self-measure pulse accurately, and to acknowledge the importance of the exercise prescription.  Exercise Prescription Goal: Starting with aerobic activity 30 plus minutes a day, 3 days per week for initial exercise prescription. Provide home exercise prescription and guidelines that participant acknowledges understanding prior to discharge.  Activity Barriers & Risk Stratification: Activity Barriers & Cardiac Risk Stratification - 10/02/16 1431      Activity Barriers & Cardiac Risk Stratification   Activity Barriers  Deconditioning;Muscular Weakness;Shortness of Breath;Arthritis left shoulder       6 Minute Walk: 6 Minute Walk    Row Name 10/05/16 1637         6 Minute Walk   Phase  Initial     Distance  800 feet     Walk Time  6 minutes     # of Rest Breaks  0     MPH  1.51     METS  2.15     RPE  13     Perceived Dyspnea   3     Symptoms  Yes (comment)     Comments  leg fatigue     Resting HR  106 bpm     Resting BP  138/69     Resting Oxygen Saturation   95 %     Exercise Oxygen Saturation  during 6 min walk  91 %     Max Ex. HR  106 bpm     Max Ex. BP  162/69     2 Minute Post BP  152/71       Interval HR   1 Minute HR  96     2 Minute HR  101     3 Minute HR  101     4 Minute HR  100     5 Minute HR  102     6 Minute HR  102     2 Minute Post HR  98     Interval Heart Rate?  Yes       Interval Oxygen   Interval Oxygen?  Yes     Baseline Oxygen Saturation %  95 %     1 Minute Oxygen Saturation %  96 %     1 Minute Liters of Oxygen  2 L     2 Minute Oxygen Saturation %  93 %     2 Minute Liters of Oxygen  2 L     3 Minute Oxygen Saturation %  93 %     3 Minute Liters of Oxygen  2 L     4 Minute Oxygen Saturation %  92 %     4 Minute Liters of Oxygen  2 L     5 Minute Oxygen Saturation %  91 %     5 Minute Liters of  Oxygen  2 L     6 Minute Oxygen Saturation %  91 %     6 Minute Liters of Oxygen  2 L     2 Minute Post Oxygen Saturation %  93 %     2 Minute  Post Liters of Oxygen  2 L        Oxygen Initial Assessment: Oxygen Initial Assessment - 10/05/16 1643      Initial 6 min Walk   Oxygen Used  Continuous;E-Tanks    Liters per minute  2      Program Oxygen Prescription   Program Oxygen Prescription  Continuous;E-Tanks    Liters per minute  2       Oxygen Re-Evaluation: Oxygen Re-Evaluation    Row Name 10/30/16 0755 11/21/16 1010 12/14/16 0929 12/29/16 0917       Program Oxygen Prescription   Program Oxygen Prescription  Continuous;E-Tanks  Continuous;E-Tanks  Continuous;E-Tanks  Continuous;E-Tanks    Liters per minute  2  2  2  2       Home Oxygen   Home Oxygen Device  -  Liquid Oxygen;Home Concentrator;Portable Concentrator  Liquid Oxygen;Home Concentrator;Portable Concentrator  Liquid Oxygen;Home Concentrator;Portable Concentrator    Sleep Oxygen Prescription  Continuous  Continuous  Continuous  Continuous    Liters per minute  2  2  2  2     Home Exercise Oxygen Prescription  Continuous  Continuous  Continuous  Continuous    Liters per minute  2  2  2  2     Home at Rest Exercise Oxygen Prescription  Continuous  Continuous  Continuous  Continuous    Liters per minute  2  2  2  2     Compliance with Home Oxygen Use  Yes  Yes  Yes  -      Goals/Expected Outcomes   Short Term Goals  To learn and exhibit compliance with exercise, home and travel O2 prescription;To learn and understand importance of monitoring SPO2 with pulse oximeter and demonstrate accurate use of the pulse oximeter.;To learn and demonstrate proper pursed lip breathing techniques or other breathing techniques.;To learn and understand importance of maintaining oxygen saturations>88%;To learn and demonstrate proper use of respiratory medications  To learn and exhibit compliance with exercise, home and travel O2  prescription;To learn and understand importance of monitoring SPO2 with pulse oximeter and demonstrate accurate use of the pulse oximeter.;To learn and demonstrate proper pursed lip breathing techniques or other breathing techniques.;To learn and understand importance of maintaining oxygen saturations>88%;To learn and demonstrate proper use of respiratory medications  To learn and exhibit compliance with exercise, home and travel O2 prescription  To learn and exhibit compliance with exercise, home and travel O2 prescription;To learn and understand importance of monitoring SPO2 with pulse oximeter and demonstrate accurate use of the pulse oximeter.;To learn and understand importance of maintaining oxygen saturations>88%;To learn and demonstrate proper pursed lip breathing techniques or other breathing techniques.;To learn and demonstrate proper use of respiratory medications    Long  Term Goals  Exhibits compliance with exercise, home and travel O2 prescription;Maintenance of O2 saturations>88%;Compliance with respiratory medication;Verbalizes importance of monitoring SPO2 with pulse oximeter and return demonstration;Exhibits proper breathing techniques, such as pursed lip breathing or other method taught during program session;Demonstrates proper use of MDI's  Exhibits compliance with exercise, home and travel O2 prescription;Maintenance of O2 saturations>88%;Compliance with respiratory medication;Verbalizes importance of monitoring SPO2 with pulse oximeter and return demonstration;Exhibits proper breathing techniques, such as pursed lip breathing or other method taught during program session;Demonstrates proper use of MDI's  Exhibits compliance with exercise, home and travel O2 prescription  Exhibits compliance with exercise, home and travel O2 prescription;Verbalizes importance of monitoring SPO2 with pulse oximeter and return demonstration;Maintenance of O2 saturations>88%;Exhibits proper breathing  techniques, such as  pursed lip breathing or other method taught during program session;Compliance with respiratory medication;Demonstrates proper use of MDI's    Comments  patient compliant with home O2 use. observed using his pulse oximeter to monitor his SPO2. He uses PLB without cueing  patient compliant with home O2 use. observed using his pulse oximeter to monitor his SPO2. He uses PLB without cueing  continues to be compliant with oxygen use  continues to be compliant with oxygen use    Goals/Expected Outcomes  -  continued compliance with oxygen useage  continued compliance  compliance       Oxygen Discharge (Final Oxygen Re-Evaluation): Oxygen Re-Evaluation - 12/29/16 0917      Program Oxygen Prescription   Program Oxygen Prescription  Continuous;E-Tanks    Liters per minute  2      Home Oxygen   Home Oxygen Device  Liquid Oxygen;Home Concentrator;Portable Concentrator    Sleep Oxygen Prescription  Continuous    Liters per minute  2    Home Exercise Oxygen Prescription  Continuous    Liters per minute  2    Home at Rest Exercise Oxygen Prescription  Continuous    Liters per minute  2      Goals/Expected Outcomes   Short Term Goals  To learn and exhibit compliance with exercise, home and travel O2 prescription;To learn and understand importance of monitoring SPO2 with pulse oximeter and demonstrate accurate use of the pulse oximeter.;To learn and understand importance of maintaining oxygen saturations>88%;To learn and demonstrate proper pursed lip breathing techniques or other breathing techniques.;To learn and demonstrate proper use of respiratory medications    Long  Term Goals  Exhibits compliance with exercise, home and travel O2 prescription;Verbalizes importance of monitoring SPO2 with pulse oximeter and return demonstration;Maintenance of O2 saturations>88%;Exhibits proper breathing techniques, such as pursed lip breathing or other method taught during program  session;Compliance with respiratory medication;Demonstrates proper use of MDI's    Comments  continues to be compliant with oxygen use    Goals/Expected Outcomes  compliance       Initial Exercise Prescription: Initial Exercise Prescription - 10/05/16 1600      Date of Initial Exercise RX and Referring Provider   Date  10/05/16    Referring Provider  Dr. [name]      Oxygen   Oxygen  Continuous    Liters  2      Recumbant Bike   Level  1    Minutes  17      NuStep   Level  1    Minutes  17    METs  1.2      Track   Laps  3    Minutes  17      Prescription Details   Frequency (times per week)  2    Duration  Progress to 45 minutes of aerobic exercise without signs/symptoms of physical distress      Intensity   THRR 40-80% of Max Heartrate  54-108    Ratings of Perceived Exertion  11-13    Perceived Dyspnea  0-4      Progression   Progression  Continue progressive overload as per policy without signs/symptoms or physical distress.      Resistance Training   Training Prescription  Yes    Weight  orange bands    Reps  10-15       Perform Capillary Blood Glucose checks as needed.  Exercise Prescription Changes: Exercise Prescription Changes    Row Name 10/12/16  1600 10/17/16 1600 11/02/16 1500 11/16/16 1552 11/21/16 1500     Response to Exercise   Blood Pressure (Admit)  152/74  134/76  128/56  124/60  148/78   Blood Pressure (Exercise)  170/72  154/72  142/76  140/70  142/80   Blood Pressure (Exit)  158/80  120/70  126/64  122/60  140/70   Heart Rate (Admit)  85 bpm  92 bpm  75 bpm  74 bpm  83 bpm   Heart Rate (Exercise)  94 bpm  94 bpm  88 bpm  92 bpm  91 bpm   Heart Rate (Exit)  82 bpm  76 bpm  85 bpm  79 bpm  65 bpm   Oxygen Saturation (Admit)  97 %  91 %  94 %  94 %  93 %   Oxygen Saturation (Exercise)  94 %  94 %  97 %  94 %  89 %   Oxygen Saturation (Exit)  99 %  99 %  99 %  100 %  99 %   Rating of Perceived Exertion (Exercise)  13  15  13  15  15     Perceived Dyspnea (Exercise)  4  3  3  3  4    Duration  Progress to 45 minutes of aerobic exercise without signs/symptoms of physical distress  Progress to 45 minutes of aerobic exercise without signs/symptoms of physical distress  Progress to 45 minutes of aerobic exercise without signs/symptoms of physical distress  Progress to 45 minutes of aerobic exercise without signs/symptoms of physical distress  Progress to 45 minutes of aerobic exercise without signs/symptoms of physical distress   Intensity  Other (comment) 40-80% of HRR  THRR unchanged  THRR unchanged  THRR unchanged  THRR unchanged     Progression   Progression  Continue to progress workloads to maintain intensity without signs/symptoms of physical distress.  Continue to progress workloads to maintain intensity without signs/symptoms of physical distress.  Continue to progress workloads to maintain intensity without signs/symptoms of physical distress.  Continue to progress workloads to maintain intensity without signs/symptoms of physical distress.  Continue to progress workloads to maintain intensity without signs/symptoms of physical distress.     Resistance Training   Training Prescription  Yes  Yes  Yes  Yes  Yes   Weight  orange bands  orange bands  orange bands  orange bands  orange bands   Reps  10-15  10-15  10-15  10-15  10-15   Time  10 Minutes  10 Minutes  10 Minutes  10 Minutes  10 Minutes     Oxygen   Oxygen  Continuous  Continuous  Continuous  Continuous  Continuous   Liters  2  2  2  2  2      Recumbant Bike   Level  1  1  -  1  -   Minutes  17  17  -  17  -     NuStep   Level  -  -  1  -  1   Minutes  -  -  17  -  34   METs  -  -  1.4  -  1.8     Track   Laps  8  6  7  5  4    Minutes  17  17  17  17  17      Home Exercise Plan   Plans to continue exercise at  -  -  -  -  Home (comment)   Frequency  -  -  -  -  Add 2 additional days to program exercise sessions.   Row Name 11/23/16 1500 11/28/16 1500  12/05/16 1500         Response to Exercise   Blood Pressure (Admit)  144/70  134/72  144/70     Blood Pressure (Exercise)  128/54  150/80  136/54     Blood Pressure (Exit)  130/72  132/70  110/66     Heart Rate (Admit)  98 bpm  79 bpm  86 bpm     Heart Rate (Exercise)  95 bpm  91 bpm  94 bpm     Heart Rate (Exit)  79 bpm  73 bpm  83 bpm     Oxygen Saturation (Admit)  94 %  97 %  96 %     Oxygen Saturation (Exercise)  92 %  96 %  93 %     Oxygen Saturation (Exit)  100 %  98 %  98 %     Rating of Perceived Exertion (Exercise)  15  13  17      Perceived Dyspnea (Exercise)  2  3  3      Duration  Progress to 45 minutes of aerobic exercise without signs/symptoms of physical distress  Progress to 45 minutes of aerobic exercise without signs/symptoms of physical distress  Progress to 45 minutes of aerobic exercise without signs/symptoms of physical distress     Intensity  THRR unchanged  THRR unchanged  THRR unchanged       Progression   Progression  Continue to progress workloads to maintain intensity without signs/symptoms of physical distress.  Continue to progress workloads to maintain intensity without signs/symptoms of physical distress.  Continue to progress workloads to maintain intensity without signs/symptoms of physical distress.       Resistance Training   Training Prescription  Yes  Yes  Yes     Weight  orange bands  orange bands  orange bands     Reps  10-15  10-15  10-15     Time  10 Minutes  10 Minutes  10 Minutes       Oxygen   Oxygen  Continuous  Continuous  Continuous     Liters  2  2  2        Recumbant Bike   Level  -  4  1     Minutes  -  17  17       NuStep   Level  1  2  2      Minutes  15  17  17      METs  1.1  1.2  1.3       Track   Laps  5  6  5      Minutes  17  17  17        Home Exercise Plan   Plans to continue exercise at  Home (comment)  -  -     Frequency  Add 2 additional days to program exercise sessions.  -  -        Exercise  Comments: Exercise Comments    Row Name 11/23/16 [phone]           Exercise Comments  Home exercise completed          Exercise Goals and Review: Exercise Goals    Row Name 10/02/16 1435             Exercise  Goals   Increase Physical Activity  Yes       Intervention  Provide advice, education, support and counseling about physical activity/exercise needs.;Develop an individualized exercise prescription for aerobic and resistive training based on initial evaluation findings, risk stratification, comorbidities and participant's personal goals.       Expected Outcomes  Achievement of increased cardiorespiratory fitness and enhanced flexibility, muscular endurance and strength shown through measurements of functional capacity and personal statement of participant.       Increase Strength and Stamina  Yes       Intervention  Provide advice, education, support and counseling about physical activity/exercise needs.;Develop an individualized exercise prescription for aerobic and resistive training based on initial evaluation findings, risk stratification, comorbidities and participant's personal goals.       Expected Outcomes  Achievement of increased cardiorespiratory fitness and enhanced flexibility, muscular endurance and strength shown through measurements of functional capacity and personal statement of participant.       Able to understand and use rate of perceived exertion (RPE) scale  Yes       Intervention  Provide education and explanation on how to use RPE scale       Expected Outcomes  Short Term: Able to use RPE daily in rehab to express subjective intensity level;Long Term:  Able to use RPE to guide intensity level when exercising independently       Able to understand and use Dyspnea scale  Yes       Intervention  Provide education and explanation on how to use Dyspnea scale       Expected Outcomes  Short Term: Able to use Dyspnea scale daily in rehab to express subjective sense of  shortness of breath during exertion;Long Term: Able to use Dyspnea scale to guide intensity level when exercising independently       Knowledge and understanding of Target Heart Rate Range (THRR)  Yes       Intervention  Provide education and explanation of THRR including how the numbers were predicted and where they are located for reference       Expected Outcomes  Short Term: Able to state/look up THRR;Long Term: Able to use THRR to govern intensity when exercising independently;Short Term: Able to use daily as guideline for intensity in rehab       Understanding of Exercise Prescription  Yes       Intervention  Provide education, explanation, and written materials on patient's individual exercise prescription       Expected Outcomes  Short Term: Able to explain program exercise prescription;Long Term: Able to explain home exercise prescription to exercise independently          Exercise Goals Re-Evaluation : Exercise Goals Re-Evaluation    Row Name 10/28/16 1340 11/20/16 1647 12/11/16 1526 12/28/16 0956       Exercise Goal Re-Evaluation   Exercise Goals Review  Increase Physical Activity;Increase Strength and Stamina;Able to understand and use rate of perceived exertion (RPE) scale;Knowledge and understanding of Target Heart Rate Range (THRR);Understanding of Exercise Prescription;Able to understand and use Dyspnea scale  Increase Strength and Stamina;Increase Physical Activity;Able to understand and use Dyspnea scale;Able to understand and use rate of perceived exertion (RPE) scale;Knowledge and understanding of Target Heart Rate Range (THRR);Understanding of Exercise Prescription  Increase Strength and Stamina;Increase Physical Activity;Able to understand and use Dyspnea scale;Able to understand and use rate of perceived exertion (RPE) scale;Knowledge and understanding of Target Heart Rate Range (THRR);Understanding of Exercise Prescription  Increase Strength and  Stamina;Able to understand and  use Dyspnea scale;Increase Physical Activity;Understanding of Exercise Prescription;Knowledge and understanding of Target Heart Rate Range (THRR)    Comments  Patient has only attended two exercise sessions. Will cont. to monitor and progress as able.   Patient is very deconditioned. Has a hard time getting to rehab. Has been out with URI. Has only attended 4 sessions. Will cont. to monitor and progress as able.   Patient is very deconditioned. Has a hard time getting to rehab. We have discussed his sedentary life. Wife preforms most of the ADL's for him. He states he understands the importance of staying active. Will cont. to progress and motivate as able.   Patient is very deconditioned. Has a hard time getting to rehab. We have discussed his sedentary life. Wife preforms most of the ADL's for him. He states he understands the importance of staying active. Will cont. to progress and motivate as able.     Expected Outcomes  Through exercising at rehab and at home, patient will increase physical strength and stamina and find ADL's easier to perform.   Through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

## 2017-01-08 DIAGNOSIS — C669 Malignant neoplasm of unspecified ureter: Secondary | ICD-10-CM | POA: Diagnosis not present

## 2017-01-08 DIAGNOSIS — R102 Pelvic and perineal pain: Secondary | ICD-10-CM | POA: Diagnosis not present

## 2017-01-08 DIAGNOSIS — N401 Enlarged prostate with lower urinary tract symptoms: Secondary | ICD-10-CM | POA: Diagnosis not present

## 2017-01-08 DIAGNOSIS — R3912 Poor urinary stream: Secondary | ICD-10-CM | POA: Diagnosis not present

## 2017-01-09 DIAGNOSIS — I5030 Unspecified diastolic (congestive) heart failure: Secondary | ICD-10-CM

## 2017-01-09 DIAGNOSIS — I5032 Chronic diastolic (congestive) heart failure: Secondary | ICD-10-CM

## 2017-01-09 DIAGNOSIS — J189 Pneumonia, unspecified organism: Secondary | ICD-10-CM

## 2017-01-09 HISTORY — DX: Unspecified diastolic (congestive) heart failure: I50.30

## 2017-01-09 HISTORY — DX: Pneumonia, unspecified organism: J18.9

## 2017-01-09 HISTORY — DX: Chronic diastolic (congestive) heart failure: I50.32

## 2017-01-10 ENCOUNTER — Encounter (HOSPITAL_COMMUNITY): Payer: Medicare Other

## 2017-01-10 ENCOUNTER — Ambulatory Visit: Payer: Medicare Other | Admitting: Family

## 2017-01-11 ENCOUNTER — Encounter: Payer: Self-pay | Admitting: Internal Medicine

## 2017-01-11 ENCOUNTER — Ambulatory Visit (INDEPENDENT_AMBULATORY_CARE_PROVIDER_SITE_OTHER): Payer: Medicare Other | Admitting: Internal Medicine

## 2017-01-11 ENCOUNTER — Encounter (HOSPITAL_COMMUNITY): Admission: RE | Admit: 2017-01-11 | Payer: Medicare Other | Source: Ambulatory Visit

## 2017-01-11 ENCOUNTER — Telehealth (HOSPITAL_COMMUNITY): Payer: Self-pay | Admitting: *Deleted

## 2017-01-11 ENCOUNTER — Ambulatory Visit (INDEPENDENT_AMBULATORY_CARE_PROVIDER_SITE_OTHER)
Admission: RE | Admit: 2017-01-11 | Discharge: 2017-01-11 | Disposition: A | Discharge status: Home / Self Care | Payer: Medicare Other | Source: Ambulatory Visit | Attending: Internal Medicine | Admitting: Internal Medicine

## 2017-01-11 VITALS — BP 114/70 | HR 94 | Temp 97.9°F | Ht 69.0 in | Wt 135.2 lb

## 2017-01-11 DIAGNOSIS — J9612 Chronic respiratory failure with hypercapnia: Secondary | ICD-10-CM | POA: Diagnosis not present

## 2017-01-11 DIAGNOSIS — J449 Chronic obstructive pulmonary disease, unspecified: Secondary | ICD-10-CM

## 2017-01-11 DIAGNOSIS — J9611 Chronic respiratory failure with hypoxia: Secondary | ICD-10-CM | POA: Diagnosis not present

## 2017-01-11 DIAGNOSIS — R0602 Shortness of breath: Secondary | ICD-10-CM | POA: Diagnosis not present

## 2017-01-11 MED ORDER — ALPRAZOLAM 0.25 MG PO TABS: 0.2500 mg | ORAL_TABLET | Freq: Three times a day (TID) | ORAL | 2 refills | Status: DC

## 2017-01-11 NOTE — Progress Notes (Signed)
Subjective:    Patient ID: [name], male    DOB: 1929-03-08   MRN: [id number]     Summary: Pulmonary/ f/u ov try astepro for pnds   Primary Provider/Referring Provider: Maylon [name]    Brief patient profile:  73  yowm last smoked 1983 with GOLD III COPD with some reversibility and 02 dep at hs and with activity    History of Present Illness   10/19/2014  f/u ov/[name] re: GOLD III / maint rx symbicort/ spiriva Chief Complaint  Patient presents with  . Follow-up    Pt c/o fatigue and increased chest congestion for the past several wks. He has am cough with white sputum. He is using xopenex 2 x per day on average.    on 10/8 started pred 20 and zpak as per med calendar action plan  gxt flat x 105mph x 15 min and zero grade on 2lpm with sats mid 90s at home  >decrease pred 5mg    11/19/2014 NP Follow up : COPD GOLD III  Returns for follow up .  Says he had a flare 4 days ago with increased SOB, occasional dry cough, chest tightness. Sinus pressure and drainage. Denies fever, wheezing, nausea or vomiting. Started on Zpack and prednisone 20mg  yesterday .  Feels better   rec Follow med calendar closely and bring to each visit.  Finish Zpack as directed.  Taper prednisone and hold at 5mg  daily  Please contact office for sooner follow up if symptoms do not improve or worsen or seek emergency care        04/15/2015  f/u ov/[name] re: GOLD III/ hypercarbic/ maint rx symb and spiriva and pred floor of 10 mg and 02 lpm 24/7  Chief Complaint  Patient presents with  . Follow-up    Pt c/o increased nasal congestion and SOB x 2 wks. He is coughing more and producing minmal clear to white sputum.  He is using xopenex inhaler 2-3 x per day.    Wakes up feeling really lousy, mild ha and sluggish despite neg osa eval in past Not using flutter and quite congested in am as well  Doe = MMRC3 = can't walk 100 yards even at a slow pace at a flat grade s stopping due to sob  Even on 02/ not using  treadmill any more  >>cont current regimen     05/21/15 Eval by [name]/ rec trial of NIV         Admit date: 09/24/2015 Discharge date: 09/26/2015 Primary Cardiologist:[name]   Discharge Diagnoses:  Principal Problem:   Angina at rest [facility]) Active Problems:   Essential hypertension   COPD GOLD IV/ 02 dep /steroid dep   Chronic respiratory failure with hypoxia and hypercapnia (HCC)   CAD (coronary artery disease)   Chest pain   Orthostatic hypotension not corrected on prednisone 20 mg daily    10/04/2015  Post hosp f/u ov/[name] re:  Prednisone 20 mg daily  Plus symb 160 2bid/ spiriva respimat / flutter valve/ has still not started astral noct vent / 02 2lpm 24/7  Chief Complaint  Patient presents with  . Follow-up    Pt. wants to discuss being on Tiotropium and Pulmicort, breathing has not been better,been in the hospital twice, some coughing in the morning   main complaint is feeling lousy when stands up, not sob/ while  maintaining per cards  on pred 20 mg daily while still on multiple bp meds for which he is getting confused in  terms of how to use.  No improvement in orthostasis on 20 mg per day (looks like also tried florinef prior to the being maint on higher "floor" prednisone dose.  Was waking up feeling flushed with high BP but apparently never happened on NIV as never used it "too much else goingtlined above  rec Continue prednisone 20mg  daily until breathing better then 10 mg daily as per your med calendar  Put the new blood pressure medicines in pencil on your med calendar Go ahead and use the Astral as much as you can until see [name]        04/17/2016  f/u ov/[name] re:   COPD IV/ 02 dep /  maint  on symb/spiriva  Chief Complaint  Patient presents with  . Follow-up    COPD, more mucus production in his nasal passenges, hard to get air through the cannula, constantly out of breathe, he cant tell if the nebulizer medications ae helping him   still exercising  daily x 10 min s stopping at 21mph flat but can't walk at [place] "I guess I walk too fast" sats are tu[oca;;u pl pm 2 lpm  Lots of nasal congestion, no using afrin or increase nasonex per action plan  rec Use afrin/ flonase to control rhinitis    06/13/2016  f/u ov/[name] re:   COPD GOLD IV/ 02 dep/ maint symb/ spiriva/ 02 2lpm and NIV / trelegy per [facility] Chief Complaint  Patient presents with  . Follow-up    Pt c/o feeling groggy and tired all of the time. He states having alot of nasal and sinus congestion.    walking on 2lpm on treadmill continuous flow x up to 10  min x 1 mph x no grade  Using afrin x 3 days chased but only using hs nasonex No correlation with daytime grogginess and time of day or whether uses NIV or not/ no am ha either way  rec As per instructions > prednisone is 20 mg daily until better then taper down to 5 mg daily  Change the afrin to where you take it 2 min before the nasonex but use it twice daily     09/13/2016  f/u ov/[name] re:  COPD GOLD IV/ 02 dep 2lpm 24/7 sym/spiriva and pred 5 mg daily  Chief Complaint  Patient presents with  . Follow-up    Down to pred 5 mg daily. He states he breathing "may be a little worse". He is using xopenex inhaler 3 x daily on average and he has not needed neb.    worse swelling legs x one month only on lasix 20 mg qod  L > R (instructions say 20 mg daily prn)  Sleeping ok off vent s groggy in am  Walking 10 min at 1 mph on 2lpm no grade most days  rec No change rx    11/07/2016 acute extended ov/[name] re: COPD GOLD IV/ 02  Chief Complaint  Patient presents with  . Acute Visit    Pt is having SOB with exertion, not feeling well. Pt is feeling cold all the time, possible chills, he is more tired than normal. Pt not breathing as well as normal, symptoms has worsened.  worse x 3 weeks prior to OV  When power went off things went downhill and never improved No vent x 4 days no change in am loc or daytime doe on vs off vent Sob  x 10 ft now Not using med cal action plan correctly/ never tried  doubling the pred dose as prev instructed for worse breathing but didn't use the saba correctly either and  hfa now poor. rec Plan A = Automatic = symbicort 160 x2 pffs and spiriva 2 pffus first thing in am then 12 hours later take the symb 160  Work on inhaler technique:  relax and gently blow all the way out then take a nice smooth deep breath back in, triggering the inhaler at same time you start breathing in.  Hold for up to 5 seconds if you can. Blow out thru nose. Rinse and gargle with water when done Plan B = Backup Only use your albuterol as a rescue medication  Plan C = Crisis - only use your albuterol nebulizer if you first try Plan B and it fails to help > ok to use the nebulizer up to every 4 hours but if start needing it regularly call for immediate appointment Plan D = Deltasone = prednisone > take 2 x 10 mg pill daily until  Breathing  bettern then 1 daily thereafter = 10 mg daily     01/11/2017  f/u ov/[name] re:  Chief Complaint  Patient presents with  . Acute Visit    Pt states that his breathing is worse and the slightest movement is making him more SOB. Pt is using his flutter valve which is helping to get mucus out. Also has complaints of CP.  DME: [facility], 2L pulse   on pred 20 mg per day x one week  02 2lpm 24/7 and with walking on gxt x 5 min still low 90's  Stopped rehab 4 weeks prior to OV  After "hit a wall" > labs ok per pt    No obvious day to day or daytime variability or assoc excess/ purulent sputum or mucus plugs or hemoptysis or cp or chest tightness, subjective wheeze or overt sinus or hb symptoms. No unusual exposure hx or h/o childhood pna/ asthma or knowledge of premature birth.  Sleeping ok flat with or without vent out nocturnal  or early am exacerbation  of respiratory  c/o's or need for noct saba. Also denies any obvious fluctuation of symptoms with weather or environmental changes or other  aggravating or alleviating factors except as outlined above   Current Allergies, Complete Past Medical History, Past Surgical History, Family History, and Social History were reviewed in [employer] record.  ROS  The following are not active complaints unless bolded Hoarseness, sore throat, dysphagia, dental problems, itching, sneezing,  nasal congestion or discharge of excess mucus or purulent secretions, ear ache,   fever, chills, sweats, unintended wt loss or wt gain, classically pleuritic or exertional cp,  orthopnea pnd or leg swelling, presyncope, palpitations, abdominal pain, anorexia, nausea, vomiting, diarrhea  or change in bowel habits or change in bladder habits, change in stools or change in urine, dysuria, hematuria,  rash, arthralgias, visual complaints, headache, numbness, weakness or ataxia or problems with walking or coordination,  change in mood/affect or memory.        Current Meds  Medication Sig  . ALPRAZolam (XANAX) 0.25 MG tablet Take 1 tablet (0.25 mg total) by mouth 3 (three) times daily. Takes 1/2 tablet two times daily.  Marland Kitchen aspirin 81 MG tablet Take 81 mg by mouth daily.  Marland Kitchen atorvastatin (LIPITOR) 20 MG tablet TAKE 1 TABLET BY MOUTH EVERY DAY  . azithromycin (ZITHROMAX) 250 MG tablet Take 2 pills today then one a day for 4 additional days  . b complex  vitamins tablet Take 1 tablet by mouth daily.  . Cholecalciferol (VITAMIN D3) 2000 units TABS Take 2,000 Units by mouth daily.  . Cyanocobalamin (VITAMIN B 12 PO) Take 1 tablet daily by mouth.   . finasteride (PROSCAR) 5 MG tablet Take 5 mg by mouth every morning.  . fludrocortisone (FLORINEF) 0.1 MG tablet 1/2 tab by mouth as needed for low blood pressure <100  . furosemide (LASIX) 20 MG tablet Take 20 mg by mouth daily as needed.  Marland Kitchen guaiFENesin (MUCINEX) 600 MG 12 hr tablet Take by mouth 2 (two) times daily as needed.  . hydrALAZINE (APRESOLINE) 25 MG tablet 1 every 6 hours as needed for high blood  pressure > 180  . mometasone (NASONEX) 50 MCG/ACT nasal spray 2 puffs into the nose in the morning and 1-2 puffs at bedtime  . mometasone (NASONEX) 50 MCG/ACT nasal spray SHAKE LIQUID AND USE 2 SPRAYS IN EACH NOSTRIL DAILY  . Multiple Vitamins-Minerals (PRESERVISION AREDS 2) CAPS Take 1 capsule by mouth daily.  Marland Kitchen NITROSTAT 0.4 MG SL tablet DISSOLVE 1 TABLET UNDER THE TONGUE AS DIRECTED AND AS NEEDED FOR CHEST PAIN  . OXYGEN Inhale 2 L continuous into the lungs.   . predniSONE (DELTASONE) 10 MG tablet Take 10 mg by mouth daily with breakfast. If having flare of cough/wheezing: May take 2 daily until better, then 1 daily  . ranitidine (ZANTAC) 150 MG tablet Take 150 mg by mouth 2 (two) times daily.  . Sodium Chloride, Hypertonic, (MURO 128 OP) Place 1 drop 4 (four) times daily into both eyes.  Marland Kitchen SPIRIVA RESPIMAT 2.5 MCG/ACT AERS INHALE 2 PUFFS BY MOUTH EVERY DAY  . SYMBICORT 160-4.5 MCG/ACT inhaler INHALE 2 PUFFS BY MOUTH EVERY MORNING AND THEN ANOTHER 2 PUFFS ABOUT 12 HOURS LATER  . TOPROL XL 25 MG 24 hr tablet TAKE 1 TABLET BY MOUTH DAILY  . XOPENEX HFA 45 MCG/ACT inhaler INHALE 2 PUFFS BY MOUTH EVERY 4 HOURS AS NEEDED FOR WHEEZING OR SHORTNESS OF BREATH  .   ALPRAZolam (XANAX) 0.25 MG tablet Takes 1/2 tablet two times daily.                                                                                                                                            Past Medical History:  CHRONIC RHINITIS (ICD-472.0)  - Sinus ct March 03, 2010 > thickening only, no acute changes  CHRONIC RESPIRATORY FAILURE (ICD-518.83)  - 02 rx since 2008  COPD UNSPECIFIED (ICD-496)....................................................................[name]  - PFTs 05/04/05 FEV1 36% ratio 28% with 29% improvement after bronchodilators DLCO 48%  - PFT's 03/26/08 30% ratio 34 with 14% improvement after bronchodilators DLC0 38 %  - Start noct 02 at 2lpm 2008 and on 03/04/09  desat @ > 185 ft so rec wear with activtiy > rm to rm  - [facility] June 30, 2008 100% > confirmed March 04, 2009  ISCHEMIC HEART DISEASE (ICD-414.9)  HYPERTENSION (ICD-401.9)  HYPERLIPIDEMIA (ICD-272.4)  SPN RUL by cxr August 19, 2009  COMPLEX MED REGIMEN-Meds reviewed with pt education and computerized med calendar 11/22/09 03/10/2011  , 10/15/2012  07/17/2013 , 12/09/2013 , 06/15/2014 , 11/19/14 , 5.11.17          Objective:   Physical Exam   hoarse amb thin wm nad/ Vital signs reviewed - Note on arrival 02 sats  94% on RA     wt 159 04/28/2010  > 10/14/2010 154  > 10/04/2011 150> 12/21/2011  150 > 149 01/29/2012 >154 04/05/2012 > 05/22/2012 148 > 09/30/2012  146 >>144> 10/15/2012 > 12/18/2012  145 > 148 02/14/2013 >148 03/14/2013 > 04/25/2013 148 > 06/19/2013 150 >>07/17/2013 > 08/27/2013 148 > 10/23/2013  148 >153 12/09/2013 > 02/26/2014  153 > 06/01/2014  152 > 157 06/15/2014 > 09/21/2014 146 > 10/19/2014 151 >>148 11/19/14 > 02/24/2015 148 > 06/30/2015    140 >  10/04/2015  136 > 12/20/2015   142 > 06/13/2016  132 > 09/13/2016   133 >  11/07/2016  139 > 01/11/2017   135    HEENT: nl dentition, turbinates bilaterally, and oropharynx. Nl external ear canals without cough reflex   NECK :  without JVD/Nodes/TM/ nl carotid upstrokes bilaterally   LUNGS: no acc muscle use,  Barrel chest, distant bs bilaterally with hyper - resonance to percussion s audible wheezes   CV:  RRR  no s3 or murmur or increase in P2, and  1+ pitting edema both ankles   ABD:  soft and nontender with nl inspiratory excursion in the supine position. No bruits or organomegaly appreciated, bowel sounds nl  MS:  Nl gait/ ext warm without deformities, calf tenderness, cyanosis or clubbing No obvious joint restrictions   SKIN: warm and dry without lesions    NEURO:  alert, approp, nl sensorium with  no motor or cerebellar deficits apparent.   CXR PA and Lateral:   01/11/2017 :    I personally reviewed images and agree with radiology impression as  follows:    Post CABG.  COPD changes without acute infiltrate

## 2017-01-11 NOTE — Patient Instructions (Addendum)
Stop the nocturnal ventilator  We will make sure your 02 is humidified 02  Change xanax to .25 mg on whole pill  three times a day as per the med calendar   Please remember to go to the  x-ray department downstairs in the basement  for your tests - we will call you with the results when they are available.   Please schedule a follow up visit in 3 months but call sooner if needed

## 2017-01-14 ENCOUNTER — Encounter: Payer: Self-pay | Admitting: Internal Medicine

## 2017-01-14 NOTE — Assessment & Plan Note (Signed)
-   Start noct 02 at 2lpm 2008 and on 03/04/09 desat @ > 185 ft so rec wear with activtiy > rm to rm  - 02/15/2011   Walked RA x one lap @ 185 stopped due to  desat > corrected on 2lpm - 02/26/2014  Walked 2lpm  2 laps @ 185 ft each stopped due to  Sob/ sats 88% at nl pace  - 10/19/2014   Walked RA  2 laps @ 185 ft each stopped due to  End of study, slow pace,  sob  But no  desat   05/21/15 Eval by Dr Dedio/ rec trial of NIV  - HCO3  10/27/15 = 34  - HC03  10/06/16  = 28 ? On noct vent (non compliant)  rx as of 01/11/2017  = rec d/c noct vent     Though somewhat paradoxic, when the lung fails to clear C02 properly and pC02 rises the lung then becomes a more efficient scavenger of C02 allowing lower work of breathing and  better C02 clearance albeit at a higher serum pC02 level - this is why pts can look a lot better than their ABG's would suggest and why it's so difficult to prognosticate endstage dz.  It's also why I strongly rec DNI status (ventilating pts down to a nl pC02 adversely affects this compensatory mechanism)   If worse am ha/ ams or daytime sob then ok to resume noct vent

## 2017-01-14 NOTE — Assessment & Plan Note (Signed)
-   PFTs  05/04/05 FEV1 36% ratio 28% with 29% improvement after bronchodilators DLCO 48%  - PFT's 03/26/08 30% ratio 34 with 14% improvement after bronchodilators DLC0 38 %  - Referred to rehab 12/19/2012 > completed March 2015  - changed to spiriva respimat 02/14/2013  - started daily prednisone 06/19/13 > improved only a little> tapered off mid July 2015 > ok to change to prn prednisone 08/27/2013  - flared off nasonex and gerd rx 09/21/2014 > resumed - 10/19/2014  p extensive coaching HFA effectiveness =    90%  - changed pred to ceiling of 20 / floor of 5 mg daily as of 10/19/2014 > changed to floor of 10 mg daily 10/04/2015  - PFT's  06/08/2015  FEV1 0.58 (22 % ) ratio 27  p No sign % improvement from saba p symb/spiriva prior to study with DLCO  9/9 % corrects to 27 % for alv volume   - changed pred to ceiling of 20 and floor of 5 mg daily 06/13/16  - referred back to rehab 09/13/2016    Very severe dz and only option left at this point is to gradually phase in palliative measures starting with titrating up xanax to max of 0.5 qid then consider clonazepam   Noct vent only serving to reducing his HC03 buffer at this point > try off if tolerates   I had an extended discussion with the patient and his wife reviewing all relevant studies completed to date and  lasting 15 to 20 minutes of a 25 minute visit    Each maintenance medication was reviewed in detail including most importantly the difference between maintenance and prns and under what circumstances the prns are to be triggered using an action plan format that is not reflected in the computer generated alphabetically organized AVS.    Please see AVS for specific instructions unique to this visit that I personally wrote and verbalized to the the pt in detail and then reviewed with pt  by my nurse highlighting any  changes in therapy recommended at today's visit to their plan of care.

## 2017-01-15 ENCOUNTER — Encounter (HOSPITAL_COMMUNITY): Payer: Medicare Other

## 2017-01-15 ENCOUNTER — Ambulatory Visit: Payer: Medicare Other | Admitting: Family

## 2017-01-16 ENCOUNTER — Encounter (HOSPITAL_COMMUNITY): Payer: Medicare Other

## 2017-01-16 DIAGNOSIS — J310 Chronic rhinitis: Secondary | ICD-10-CM | POA: Diagnosis not present

## 2017-01-16 DIAGNOSIS — H838X3 Other specified diseases of inner ear, bilateral: Secondary | ICD-10-CM | POA: Diagnosis not present

## 2017-01-16 DIAGNOSIS — H903 Sensorineural hearing loss, bilateral: Secondary | ICD-10-CM | POA: Diagnosis not present

## 2017-01-16 DIAGNOSIS — J342 Deviated nasal septum: Secondary | ICD-10-CM | POA: Diagnosis not present

## 2017-01-16 DIAGNOSIS — J343 Hypertrophy of nasal turbinates: Secondary | ICD-10-CM | POA: Diagnosis not present

## 2017-01-18 ENCOUNTER — Encounter (HOSPITAL_COMMUNITY): Payer: Medicare Other

## 2017-01-23 ENCOUNTER — Encounter (HOSPITAL_COMMUNITY): Payer: Medicare Other

## 2017-01-23 DIAGNOSIS — D0422 Carcinoma in situ of skin of left ear and external auricular canal: Secondary | ICD-10-CM | POA: Diagnosis not present

## 2017-01-23 DIAGNOSIS — L409 Psoriasis, unspecified: Secondary | ICD-10-CM | POA: Diagnosis not present

## 2017-01-25 ENCOUNTER — Encounter (HOSPITAL_COMMUNITY): Payer: Medicare Other

## 2017-01-30 ENCOUNTER — Encounter (HOSPITAL_COMMUNITY): Payer: Medicare Other

## 2017-02-02 DIAGNOSIS — I872 Venous insufficiency (chronic) (peripheral): Secondary | ICD-10-CM | POA: Diagnosis not present

## 2017-02-02 DIAGNOSIS — I4949 Other premature depolarization: Secondary | ICD-10-CM | POA: Diagnosis not present

## 2017-02-02 DIAGNOSIS — I119 Hypertensive heart disease without heart failure: Secondary | ICD-10-CM | POA: Diagnosis not present

## 2017-02-02 DIAGNOSIS — I739 Peripheral vascular disease, unspecified: Secondary | ICD-10-CM | POA: Diagnosis not present

## 2017-02-02 DIAGNOSIS — I951 Orthostatic hypotension: Secondary | ICD-10-CM | POA: Diagnosis not present

## 2017-02-02 DIAGNOSIS — E785 Hyperlipidemia, unspecified: Secondary | ICD-10-CM | POA: Diagnosis not present

## 2017-02-02 DIAGNOSIS — R0600 Dyspnea, unspecified: Secondary | ICD-10-CM | POA: Diagnosis not present

## 2017-02-02 DIAGNOSIS — I2781 Cor pulmonale (chronic): Secondary | ICD-10-CM | POA: Diagnosis not present

## 2017-02-02 DIAGNOSIS — J449 Chronic obstructive pulmonary disease, unspecified: Secondary | ICD-10-CM | POA: Diagnosis not present

## 2017-02-02 DIAGNOSIS — I2510 Atherosclerotic heart disease of native coronary artery without angina pectoris: Secondary | ICD-10-CM | POA: Diagnosis not present

## 2017-02-02 DIAGNOSIS — I6523 Occlusion and stenosis of bilateral carotid arteries: Secondary | ICD-10-CM | POA: Diagnosis not present

## 2017-02-02 DIAGNOSIS — R0609 Other forms of dyspnea: Secondary | ICD-10-CM | POA: Diagnosis not present

## 2017-02-02 NOTE — Addendum Note (Signed)
Encounter addended by: Jewel Baize, RD on: 02/02/2017 11:18 AM  Actions taken: Flowsheet data copied forward, Visit Navigator Flowsheet section accepted

## 2017-02-04 ENCOUNTER — Emergency Department (HOSPITAL_COMMUNITY): Payer: Medicare Other

## 2017-02-04 ENCOUNTER — Encounter (HOSPITAL_COMMUNITY): Payer: Self-pay | Admitting: *Deleted

## 2017-02-04 ENCOUNTER — Other Ambulatory Visit: Payer: Self-pay

## 2017-02-04 ENCOUNTER — Inpatient Hospital Stay (HOSPITAL_COMMUNITY)
Admission: EM | Admit: 2017-02-04 | Discharge: 2017-02-09 | DRG: 193 | Disposition: A | Discharge status: Home Health | Payer: Medicare Other | Attending: Internal Medicine | Admitting: Internal Medicine

## 2017-02-04 DIAGNOSIS — R1310 Dysphagia, unspecified: Secondary | ICD-10-CM | POA: Diagnosis present

## 2017-02-04 DIAGNOSIS — Z881 Allergy status to other antibiotic agents status: Secondary | ICD-10-CM

## 2017-02-04 DIAGNOSIS — E860 Dehydration: Secondary | ICD-10-CM | POA: Diagnosis not present

## 2017-02-04 DIAGNOSIS — J101 Influenza due to other identified influenza virus with other respiratory manifestations: Secondary | ICD-10-CM | POA: Diagnosis present

## 2017-02-04 DIAGNOSIS — J9621 Acute and chronic respiratory failure with hypoxia: Secondary | ICD-10-CM | POA: Diagnosis present

## 2017-02-04 DIAGNOSIS — E861 Hypovolemia: Secondary | ICD-10-CM | POA: Diagnosis not present

## 2017-02-04 DIAGNOSIS — I10 Essential (primary) hypertension: Secondary | ICD-10-CM | POA: Diagnosis present

## 2017-02-04 DIAGNOSIS — R059 Cough, unspecified: Secondary | ICD-10-CM

## 2017-02-04 DIAGNOSIS — J111 Influenza due to unidentified influenza virus with other respiratory manifestations: Secondary | ICD-10-CM

## 2017-02-04 DIAGNOSIS — J9612 Chronic respiratory failure with hypercapnia: Secondary | ICD-10-CM | POA: Diagnosis present

## 2017-02-04 DIAGNOSIS — I9589 Other hypotension: Secondary | ICD-10-CM | POA: Diagnosis not present

## 2017-02-04 DIAGNOSIS — I739 Peripheral vascular disease, unspecified: Secondary | ICD-10-CM | POA: Diagnosis present

## 2017-02-04 DIAGNOSIS — Z7982 Long term (current) use of aspirin: Secondary | ICD-10-CM

## 2017-02-04 DIAGNOSIS — R110 Nausea: Secondary | ICD-10-CM | POA: Diagnosis not present

## 2017-02-04 DIAGNOSIS — R42 Dizziness and giddiness: Secondary | ICD-10-CM | POA: Diagnosis not present

## 2017-02-04 DIAGNOSIS — J449 Chronic obstructive pulmonary disease, unspecified: Secondary | ICD-10-CM | POA: Diagnosis not present

## 2017-02-04 DIAGNOSIS — I25810 Atherosclerosis of coronary artery bypass graft(s) without angina pectoris: Secondary | ICD-10-CM

## 2017-02-04 DIAGNOSIS — N400 Enlarged prostate without lower urinary tract symptoms: Secondary | ICD-10-CM | POA: Diagnosis present

## 2017-02-04 DIAGNOSIS — J441 Chronic obstructive pulmonary disease with (acute) exacerbation: Secondary | ICD-10-CM | POA: Diagnosis present

## 2017-02-04 DIAGNOSIS — Z79899 Other long term (current) drug therapy: Secondary | ICD-10-CM

## 2017-02-04 DIAGNOSIS — R531 Weakness: Secondary | ICD-10-CM | POA: Diagnosis not present

## 2017-02-04 DIAGNOSIS — K219 Gastro-esophageal reflux disease without esophagitis: Secondary | ICD-10-CM | POA: Diagnosis present

## 2017-02-04 DIAGNOSIS — F419 Anxiety disorder, unspecified: Secondary | ICD-10-CM | POA: Diagnosis present

## 2017-02-04 DIAGNOSIS — E785 Hyperlipidemia, unspecified: Secondary | ICD-10-CM | POA: Diagnosis present

## 2017-02-04 DIAGNOSIS — Z888 Allergy status to other drugs, medicaments and biological substances status: Secondary | ICD-10-CM

## 2017-02-04 DIAGNOSIS — R0602 Shortness of breath: Secondary | ICD-10-CM | POA: Diagnosis not present

## 2017-02-04 DIAGNOSIS — R600 Localized edema: Secondary | ICD-10-CM | POA: Diagnosis not present

## 2017-02-04 DIAGNOSIS — Z882 Allergy status to sulfonamides status: Secondary | ICD-10-CM

## 2017-02-04 DIAGNOSIS — Z87891 Personal history of nicotine dependence: Secondary | ICD-10-CM

## 2017-02-04 DIAGNOSIS — Z951 Presence of aortocoronary bypass graft: Secondary | ICD-10-CM

## 2017-02-04 DIAGNOSIS — R05 Cough: Secondary | ICD-10-CM

## 2017-02-04 DIAGNOSIS — R54 Age-related physical debility: Secondary | ICD-10-CM | POA: Diagnosis present

## 2017-02-04 DIAGNOSIS — Z9981 Dependence on supplemental oxygen: Secondary | ICD-10-CM

## 2017-02-04 DIAGNOSIS — I2510 Atherosclerotic heart disease of native coronary artery without angina pectoris: Secondary | ICD-10-CM | POA: Diagnosis present

## 2017-02-04 DIAGNOSIS — Z7952 Long term (current) use of systemic steroids: Secondary | ICD-10-CM

## 2017-02-04 HISTORY — DX: Dependence on supplemental oxygen: Z99.81

## 2017-02-04 LAB — INFLUENZA PANEL BY PCR (TYPE A & B)
Influenza A By PCR: POSITIVE — AB
Influenza B By PCR: NEGATIVE

## 2017-02-04 LAB — CBC
HCT: 39.9 % (ref 39.0–52.0)
Hemoglobin: 13.2 g/dL (ref 13.0–17.0)
MCH: 32.6 pg (ref 26.0–34.0)
MCHC: 33.1 g/dL (ref 30.0–36.0)
MCV: 98.5 fL (ref 78.0–100.0)
Platelets: 160 10*3/uL (ref 150–400)
RBC: 4.05 MIL/uL — ABNORMAL LOW (ref 4.22–5.81)
RDW: 13.1 % (ref 11.5–15.5)
WBC: 9.9 10*3/uL (ref 4.0–10.5)

## 2017-02-04 LAB — I-STAT CG4 LACTIC ACID, ED
Lactic Acid, Venous: 1.12 mmol/L (ref 0.5–1.9)
Lactic Acid, Venous: 0.99 mmol/L (ref 0.5–1.9)

## 2017-02-04 LAB — CBC WITH DIFFERENTIAL/PLATELET
Basophils Absolute: 0 10*3/uL (ref 0.0–0.1)
Basophils Relative: 0 %
Eosinophils Absolute: 0 10*3/uL (ref 0.0–0.7)
Eosinophils Relative: 0 %
HCT: 41.2 % (ref 39.0–52.0)
Hemoglobin: 13.6 g/dL (ref 13.0–17.0)
Lymphocytes Relative: 7 %
Lymphs Abs: 0.7 10*3/uL (ref 0.7–4.0)
MCH: 32.5 pg (ref 26.0–34.0)
MCHC: 33 g/dL (ref 30.0–36.0)
MCV: 98.3 fL (ref 78.0–100.0)
Monocytes Absolute: 0.8 10*3/uL (ref 0.1–1.0)
Monocytes Relative: 8 %
Neutro Abs: 8.6 10*3/uL — ABNORMAL HIGH (ref 1.7–7.7)
Neutrophils Relative %: 85 %
Platelets: 158 10*3/uL (ref 150–400)
RBC: 4.19 MIL/uL — ABNORMAL LOW (ref 4.22–5.81)
RDW: 13.1 % (ref 11.5–15.5)
WBC: 10.1 10*3/uL (ref 4.0–10.5)

## 2017-02-04 LAB — BASIC METABOLIC PANEL
Anion gap: 11 (ref 5–15)
BUN: 15 mg/dL (ref 6–20)
CO2: 28 mmol/L (ref 22–32)
Calcium: 9.4 mg/dL (ref 8.9–10.3)
Chloride: 98 mmol/L — ABNORMAL LOW (ref 101–111)
Creatinine, Ser: 1.11 mg/dL (ref 0.61–1.24)
GFR calc Af Amer: >60 mL/min (ref >60)
GFR calc non Af Amer: 59 mL/min — ABNORMAL LOW (ref >60)
Glucose, Bld: 102 mg/dL — ABNORMAL HIGH (ref 65–99)
Potassium: 4.1 mmol/L (ref 3.5–5.1)
Sodium: 137 mmol/L (ref 135–145)

## 2017-02-04 LAB — I-STAT TROPONIN, ED: Troponin i, poc: 0.06 ng/mL (ref 0.00–0.08)

## 2017-02-04 MED ORDER — SODIUM CHLORIDE 0.9 % IV BOLUS (SEPSIS)
1000.0000 mL | Freq: Once | INTRAVENOUS | Status: AC
  Administered 2017-02-04: 1000 mL via INTRAVENOUS

## 2017-02-04 MED ORDER — SENNOSIDES-DOCUSATE SODIUM 8.6-50 MG PO TABS: 1.0000 | ORAL_TABLET | Freq: Every evening | ORAL | Status: DC | PRN

## 2017-02-04 MED ORDER — ATORVASTATIN CALCIUM 20 MG PO TABS
20.0000 mg | ORAL_TABLET | Freq: Every day | ORAL | Status: DC
  Administered 2017-02-05 – 2017-02-09 (×5): 20 mg via ORAL
  Filled 2017-02-04 (×5): qty 1

## 2017-02-04 MED ORDER — SODIUM CHLORIDE 0.9% FLUSH
3.0000 mL | Freq: Two times a day (BID) | INTRAVENOUS | Status: DC
  Administered 2017-02-04 – 2017-02-09 (×9): 3 mL via INTRAVENOUS

## 2017-02-04 MED ORDER — ASPIRIN 81 MG PO CHEW
81.0000 mg | CHEWABLE_TABLET | Freq: Every day | ORAL | Status: DC
  Administered 2017-02-05 – 2017-02-09 (×5): 81 mg via ORAL
  Filled 2017-02-04 (×5): qty 1

## 2017-02-04 MED ORDER — ACETAMINOPHEN 650 MG RE SUPP: 650.0000 mg | Freq: Four times a day (QID) | RECTAL | Status: DC | PRN

## 2017-02-04 MED ORDER — SODIUM CHLORIDE 0.9 % IV SOLN
INTRAVENOUS | Status: DC
  Administered 2017-02-04: 1 mL via INTRAVENOUS

## 2017-02-04 MED ORDER — ARFORMOTEROL TARTRATE 15 MCG/2ML IN NEBU
15.0000 ug | INHALATION_SOLUTION | Freq: Two times a day (BID) | RESPIRATORY_TRACT | Status: DC
  Filled 2017-02-04: qty 2

## 2017-02-04 MED ORDER — LEVALBUTEROL HCL 1.25 MG/0.5ML IN NEBU
1.2500 mg | INHALATION_SOLUTION | Freq: Four times a day (QID) | RESPIRATORY_TRACT | Status: DC | PRN
  Administered 2017-02-06: 1.25 mg via RESPIRATORY_TRACT
  Filled 2017-02-04 (×2): qty 0.5

## 2017-02-04 MED ORDER — DEXTROSE 5 % IV SOLN
500.0000 mg | Freq: Once | INTRAVENOUS | Status: AC
  Administered 2017-02-04: 500 mg via INTRAVENOUS
  Filled 2017-02-04: qty 500

## 2017-02-04 MED ORDER — ONDANSETRON HCL 4 MG/2ML IJ SOLN: 4.0000 mg | Freq: Four times a day (QID) | INTRAMUSCULAR | Status: DC | PRN

## 2017-02-04 MED ORDER — VITAMIN D3 50 MCG (2000 UT) PO TABS: 2000.0000 [IU] | ORAL_TABLET | Freq: Every day | ORAL | Status: DC

## 2017-02-04 MED ORDER — B COMPLEX-C PO TABS
1.0000 | ORAL_TABLET | Freq: Every day | ORAL | Status: DC
  Administered 2017-02-05 – 2017-02-09 (×5): 1 via ORAL
  Filled 2017-02-04 (×5): qty 1

## 2017-02-04 MED ORDER — ALPRAZOLAM 0.25 MG PO TABS
0.2500 mg | ORAL_TABLET | Freq: Three times a day (TID) | ORAL | Status: DC
  Administered 2017-02-05 – 2017-02-06 (×3): 0.25 mg via ORAL
  Filled 2017-02-04 (×6): qty 1

## 2017-02-04 MED ORDER — METHYLPREDNISOLONE SODIUM SUCC 125 MG IJ SOLR
125.0000 mg | Freq: Once | INTRAMUSCULAR | Status: AC
  Administered 2017-02-04: 125 mg via INTRAVENOUS
  Filled 2017-02-04: qty 2

## 2017-02-04 MED ORDER — FAMOTIDINE 20 MG PO TABS
20.0000 mg | ORAL_TABLET | Freq: Two times a day (BID) | ORAL | Status: DC
  Administered 2017-02-04 – 2017-02-09 (×10): 20 mg via ORAL
  Filled 2017-02-04 (×10): qty 1

## 2017-02-04 MED ORDER — METOPROLOL SUCCINATE ER 25 MG PO TB24: 25.0000 mg | ORAL_TABLET | Freq: Every day | ORAL | Status: DC

## 2017-02-04 MED ORDER — TIOTROPIUM BROMIDE MONOHYDRATE 18 MCG IN CAPS
18.0000 ug | ORAL_CAPSULE | Freq: Every day | RESPIRATORY_TRACT | Status: DC
  Administered 2017-02-05 – 2017-02-09 (×5): 18 ug via RESPIRATORY_TRACT
  Filled 2017-02-04: qty 5

## 2017-02-04 MED ORDER — VITAMIN D 1000 UNITS PO TABS
2000.0000 [IU] | ORAL_TABLET | Freq: Every day | ORAL | Status: DC
  Administered 2017-02-05 – 2017-02-09 (×5): 2000 [IU] via ORAL
  Filled 2017-02-04 (×5): qty 2

## 2017-02-04 MED ORDER — PREDNISONE 20 MG PO TABS
20.0000 mg | ORAL_TABLET | Freq: Every day | ORAL | Status: DC
  Administered 2017-02-05: 20 mg via ORAL
  Filled 2017-02-04 (×2): qty 1

## 2017-02-04 MED ORDER — BUDESONIDE 0.5 MG/2ML IN SUSP
0.5000 mg | Freq: Two times a day (BID) | RESPIRATORY_TRACT | Status: DC
  Filled 2017-02-04: qty 2

## 2017-02-04 MED ORDER — OCUVITE-LUTEIN PO CAPS
1.0000 | ORAL_CAPSULE | Freq: Every day | ORAL | Status: DC
  Filled 2017-02-04: qty 1

## 2017-02-04 MED ORDER — FLUTICASONE PROPIONATE 50 MCG/ACT NA SUSP
2.0000 | Freq: Two times a day (BID) | NASAL | Status: DC
  Administered 2017-02-05 – 2017-02-09 (×8): 2 via NASAL
  Filled 2017-02-04: qty 16

## 2017-02-04 MED ORDER — NITROGLYCERIN 0.4 MG SL SUBL: 0.4000 mg | SUBLINGUAL_TABLET | SUBLINGUAL | Status: DC | PRN

## 2017-02-04 MED ORDER — FINASTERIDE 5 MG PO TABS
5.0000 mg | ORAL_TABLET | Freq: Every morning | ORAL | Status: DC
  Administered 2017-02-05 – 2017-02-09 (×5): 5 mg via ORAL
  Filled 2017-02-04 (×5): qty 1

## 2017-02-04 MED ORDER — ACETAMINOPHEN 325 MG PO TABS
650.0000 mg | ORAL_TABLET | Freq: Once | ORAL | Status: AC
  Administered 2017-02-04: 650 mg via ORAL
  Filled 2017-02-04: qty 2

## 2017-02-04 MED ORDER — ACETAMINOPHEN 325 MG PO TABS: 650.0000 mg | ORAL_TABLET | Freq: Four times a day (QID) | ORAL | Status: DC | PRN

## 2017-02-04 MED ORDER — ASPIRIN 81 MG PO TABS: 81.0000 mg | ORAL_TABLET | Freq: Every day | ORAL | Status: DC

## 2017-02-04 MED ORDER — GUAIFENESIN ER 600 MG PO TB12
600.0000 mg | ORAL_TABLET | Freq: Two times a day (BID) | ORAL | Status: DC | PRN
  Administered 2017-02-06 – 2017-02-09 (×6): 600 mg via ORAL
  Filled 2017-02-04 (×6): qty 1

## 2017-02-04 MED ORDER — OSELTAMIVIR PHOSPHATE 75 MG PO CAPS
75.0000 mg | ORAL_CAPSULE | Freq: Once | ORAL | Status: AC
Start: 2017-02-04 — End: 2017-02-04
  Administered 2017-02-04: 75 mg via ORAL
  Filled 2017-02-04: qty 1

## 2017-02-04 MED ORDER — CEFTRIAXONE SODIUM 1 G IJ SOLR
1.0000 g | Freq: Once | INTRAMUSCULAR | Status: AC
  Administered 2017-02-04: 1 g via INTRAVENOUS
  Filled 2017-02-04: qty 10

## 2017-02-04 MED ORDER — ENOXAPARIN SODIUM 40 MG/0.4ML ~~LOC~~ SOLN
40.0000 mg | SUBCUTANEOUS | Status: DC
  Filled 2017-02-04: qty 0.4

## 2017-02-04 MED ORDER — ONDANSETRON HCL 4 MG PO TABS
4.0000 mg | ORAL_TABLET | Freq: Four times a day (QID) | ORAL | Status: DC | PRN
Start: 2017-02-04 — End: 2017-02-09

## 2017-02-04 MED ORDER — METOCLOPRAMIDE HCL 5 MG/ML IJ SOLN
5.0000 mg | Freq: Once | INTRAMUSCULAR | Status: AC
  Administered 2017-02-04: 5 mg via INTRAVENOUS
  Filled 2017-02-04: qty 2

## 2017-02-04 NOTE — ED Notes (Signed)
ED Provider at bedside. 

## 2017-02-04 NOTE — H&P (Signed)
History and Physical    [name] XNA:[id number] DOB: March 17, 1929  DOA: 02/04/2017 PCP: [name], MD  Patient coming from: Home   Chief Complaint: Weakness and SOB   HPI: [name] is a 84 y.o. male with medical history significant of COPD oxygen dependent, CAD status post CABG, BPH, hypertension and GERD presented to the emergency department complaining of generalized weakness and increase in short of breath.  Associated symptoms include headache, body aches, nonproductive cough and fever.  Patient reported that for the past 2 days he has progressively feeling weak and increasing difficulty breathing with exertion.  He notes some discomfort in his chest and use his inhaler without any significant relief.  Patient denies chest pain, palpitations, dizziness, nausea and vomiting.  ED Course: Found to be influenza A+, blood pressure was down to 90/50, fever of 100.7 and tachypneic up to 25.  Normal lactic acid, chest x-ray with no acute abnormalities. Breathing on nasal cannula to 100%. [facility] asked to admit for further observation  Review of Systems:   General:  See HPI HEENT: no blurry vision, hearing changes or sore throat Respiratory:  See HPI CV: no chest pain, no palpitations GI: no nausea, vomiting, abdominal pain, diarrhea, constipation GU: no dysuria, burning on urination, increased urinary frequency, hematuria  Ext:. No deformities,  Neuro: no unilateral weakness, numbness, or tingling. Positive for dysphagia  Skin: No rashes, lesions or wounds. MSK: No muscle spasm, no deformity, no limitation of range of movement in spin Heme: No easy bruising.  Travel history: No recent long distant travel.   Past Medical History:  Diagnosis Date  . Asthma   . BPH (benign prostatic hyperplasia)    severe; s/p multiple biopsies  . CAD (coronary artery disease)   . Carotid artery occlusion   . Chronic rhinitis   . COPD (chronic obstructive pulmonary disease) (HCC)    2L  [place] O2  . Hyperlipidemia   . Hypertension   . Peripheral vascular disease ([place])   . 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 Syncope and collapse   . Ureteral tumor 08/2015   had endoscopic procedure for evaluation, unable to reach for biopsy    Past Surgical History:  Procedure Laterality Date  . CATARACT EXTRACTION  11-20-11  . CORONARY ARTERY BYPASS GRAFT  2003  . LITHOTRIPSY    . PROSTATE BIOPSY  Oct. 2014     reports that he quit smoking about 35 years ago. His smoking use included cigarettes. He has a 66.00 pack-year smoking history. he has never used smokeless tobacco. He reports that he drinks alcohol. He reports that he does not use drugs.  Allergies  Allergen Reactions  . Sulfonamide Derivatives Swelling    REACTION: facial/tongue swelling  . Cefdinir Diarrhea    REACTION: diarrhea  . Ciprofloxacin Itching and Rash    Red itchy hands  . Nitrofurantoin Swelling    Swollen hands  . Levaquin [Levofloxacin] Itching and Rash    Family History  Problem Relation Age of Onset  . Heart disease Mother   . Breast cancer Mother   . Cancer Mother        Breast cancer  . Hypertension Mother   . Other Mother        AAA  and   Amputation  . Heart disease Father        Before age 53  . Heart attack Father   . Stroke Brother        x3, still  living   . Peripheral vascular disease Brother   . Heart disease Brother   . Hyperlipidemia Brother   . Hypertension Brother   . Heart disease Brother   . Allergies Brother    Family history reviewed and nonpertinent  Prior to Admission medications   Medication Sig Start Date End Date Taking? Authorizing Provider  ALPRAZolam (XANAX) 0.25 MG tablet Take 1 tablet (0.25 mg total) by mouth 3 (three) times daily. Takes 1/2 tablet two times daily. 01/11/17  Yes Tanda Rockers, MD  aspirin 81 MG tablet Take 81 mg by mouth daily.   Yes [provider]  atorvastatin (LIPITOR) 20 MG tablet TAKE 1 TABLET BY MOUTH EVERY DAY 05/24/16  Yes Belva Crome, MD  b complex vitamins tablet Take 1 tablet by mouth daily.   Yes [provider]  Cholecalciferol (VITAMIN D3) 2000 units TABS Take 2,000 Units by mouth daily.   Yes [provider]  Cyanocobalamin (VITAMIN B 12 PO) Take 1 tablet daily by mouth.    Yes [provider]  finasteride (PROSCAR) 5 MG tablet Take 5 mg by mouth every morning.   Yes [provider]  guaiFENesin (MUCINEX) 600 MG 12 hr tablet Take by mouth 2 (two) times daily as needed.   Yes [provider]  mometasone (NASONEX) 50 MCG/ACT nasal spray SHAKE LIQUID AND USE 2 SPRAYS IN EACH NOSTRIL DAILY 12/25/16  Yes Tanda Rockers, MD  Multiple Vitamins-Minerals (PRESERVISION AREDS 2) CAPS Take 1 capsule by mouth daily.   Yes [provider]  NITROSTAT 0.4 MG SL tablet DISSOLVE 1 TABLET UNDER THE TONGUE AS DIRECTED AND AS NEEDED FOR CHEST PAIN 05/13/15  Yes Belva Crome, MD  OXYGEN Inhale 2 L continuous into the lungs.    Yes [provider]  predniSONE (DELTASONE) 10 MG tablet Take 10 mg by mouth daily with breakfast. If having flare of cough/wheezing: May take 2 daily until better, then 1 daily   Yes [provider]  ranitidine (ZANTAC) 150 MG tablet Take 150 mg by mouth 2 (two) times daily.   Yes [provider]  Sodium Chloride, Hypertonic, (MURO 128 OP) Place 1 drop 4 (four) times daily into both eyes.   Yes [provider]  SPIRIVA RESPIMAT 2.5 MCG/ACT AERS INHALE 2 PUFFS BY MOUTH EVERY DAY 10/05/16  Yes Tanda Rockers, MD  SYMBICORT 160-4.5 MCG/ACT inhaler INHALE 2 PUFFS BY MOUTH EVERY MORNING AND THEN ANOTHER 2 PUFFS ABOUT 12 HOURS LATER 11/15/16  Yes Tanda Rockers, MD  TOPROL XL 25 MG 24 hr tablet TAKE 1 TABLET BY MOUTH DAILY 04/25/16  Yes Belva Crome, MD  XOPENEX HFA 45 MCG/ACT inhaler INHALE 2 PUFFS BY MOUTH EVERY 4 HOURS AS NEEDED FOR WHEEZING OR SHORTNESS OF BREATH 10/05/16  Yes Tanda Rockers, MD  azithromycin (ZITHROMAX) 250 MG  tablet Take 2 pills today then one a day for 4 additional days 12/06/16   Tanda Rockers, MD    Physical Exam: Vitals:   02/04/17 1659 02/04/17 1700 02/04/17 1730 02/04/17 1800  BP:  97/60 101/66 112/72  Pulse:  86 87 80  Resp:  15 17 (!) 24  Temp: 98.2 F (36.8 C)     TempSrc: Oral     SpO2:  95% 98% 98%     Constitutional: NAD, calm, comfortable Eyes: PERRL, lids and conjunctivae normal ENMT: Mucous membranes are dry. Posterior pharynx clear of any exudate or lesions. Neck: normal, supple, no  masses, no thyromegaly Respiratory: Good air entry, slight expiratory wheezing diffuse. O2 2 L nasal cannula Cardiovascular: Regular rate and rhythm, no murmurs / rubs / gallops. No extremity edema. 2+ pedal pulses. No carotid bruits.  Abdomen: no tenderness, no masses palpated. No hepatosplenomegaly. Bowel sounds positive.  Musculoskeletal: no clubbing / cyanosis. No joint deformity upper and lower extremities. Good ROM Skin: no rashes, lesions, ulcers. No induration Neurologic: CN 2-12 grossly intact. Strength 5/5 in all 4.  Psychiatric: Normal judgment and insight. Alert and oriented x 3. Normal mood.    Labs on Admission: I have personally reviewed following labs and imaging studies  CBC: Recent Labs  Lab 02/04/17 1429 02/04/17 1513  WBC 9.9 10.1  NEUTROABS  --  8.6*  HGB 13.2 13.6  HCT 39.9 41.2  MCV 98.5 98.3  PLT 160 846   Basic Metabolic Panel: Recent Labs  Lab 02/04/17 1429  NA 137  K 4.1  CL 98*  CO2 28  GLUCOSE 102*  BUN 15  CREATININE 1.11  CALCIUM 9.4   GFR: CrCl cannot be calculated (Unknown ideal weight.). Liver Function Tests: No results for input(s): AST, ALT, ALKPHOS, BILITOT, PROT, ALBUMIN in the last 168 hours. No results for input(s): LIPASE, AMYLASE in the last 168 hours. No results for input(s): AMMONIA in the last 168 hours. Coagulation Profile: No results for input(s): INR, PROTIME in the last 168 hours. Cardiac Enzymes: No results for  input(s): CKTOTAL, CKMB, CKMBINDEX, TROPONINI in the last 168 hours. BNP (last 3 results) No results for input(s): PROBNP in the last 8760 hours. HbA1C: No results for input(s): HGBA1C in the last 72 hours. CBG: No results for input(s): GLUCAP in the last 168 hours. Lipid Profile: No results for input(s): CHOL, HDL, LDLCALC, TRIG, CHOLHDL, LDLDIRECT in the last 72 hours. Thyroid Function Tests: No results for input(s): TSH, T4TOTAL, FREET4, T3FREE, THYROIDAB in the last 72 hours. Anemia Panel: No results for input(s): VITAMINB12, FOLATE, FERRITIN, TIBC, IRON, RETICCTPCT in the last 72 hours. Urine analysis:    Component Value Date/Time   COLORURINE YELLOW 09/12/2015 2246   APPEARANCEUR CLEAR 09/12/2015 2246   LABSPEC 1.009 09/12/2015 2246   PHURINE 7.0 09/12/2015 2246   GLUCOSEU NEGATIVE 09/12/2015 2246   HGBUR NEGATIVE 09/12/2015 2246   BILIRUBINUR NEGATIVE 09/12/2015 2246   KETONESUR NEGATIVE 09/12/2015 2246   PROTEINUR NEGATIVE 09/12/2015 2246   NITRITE NEGATIVE 09/12/2015 2246   LEUKOCYTESUR NEGATIVE 09/12/2015 2246   Sepsis Labs: !!!!!!!!!!!!!!!!!!!!!!!!!!!!!!!!!!!!!!!!!!!! @LABRCNTIP (procalcitonin:4,lacticidven:4) )No results found for this or any previous visit (from the past 240 hour(s)).   Radiological Exams on Admission: Dg Chest 2 View  Result Date: 02/04/2017 CLINICAL DATA:  Patient with history of COPD. EXAM: CHEST  2 VIEW COMPARISON:  Chest radiograph 01/11/2017 FINDINGS: Monitoring leads overlie the patient. Stable cardiac and mediastinal contours. No consolidative pulmonary opacities. Pulmonary hyperinflation. No pleural effusion or pneumothorax. Thoracic spine degenerative changes. Lateral view nondiagnostic due to overlapping soft tissues superiorly. IMPRESSION: No acute osseous abnormality. Electronically Signed   By: Lovey Newcomer M.D.   On: 02/04/2017 16:31    EKG: Ordered   Assessment/Plan Influenza A leading to mild COPD exacerbation  Will place in  Observation, given commodities and BP. Patient hemodynamically stable  Will start oseltamivir, prescription for prophylaxis called in for his wife Continue gentle hydration - monitor for fluid overload Was given Rocephin and Azithromycin in the ED, will d/c as etiology is viral.  Continue O2 supplementation, keep O2 sat between 88-92%  Will increase Prednisone to  20 mg during acute process, then reduce to home dose  Nebulizer treatment, Brovana, Pulmicor and Xopenex  Continue Spiriva    COPD oxygen dependent with mild exacerbation  See Above   Hypotension in setting of viral illness and dehydration  Hold BP medications  Patient received 2 NS bolus, with good response, will continue gentle hydration Monitor   Hx of HTN  See Above   CAD s/p CABG  Currently chest pain free Continue ASA  Continue Nitro SLG PRN  Monitor   BPH - stable  Continue Finasteride   Off note - patient report that has some chocking episode (Dysphagia) Will get SLP eval, to rule out micro or silent aspiration  DVT prophylaxis: Lovenox  Code Status: Full Code - discussed in details with patient and wife  Family Communication: Wife at bedside  Disposition Plan: Anticipate discharge to previous home environment.  Consults called: None  Admission status: Med-surg/Obs    Chipper Oman MD [facility] Pager: Text Page via [email or web address]  [phone]  If 7PM-7AM, please contact night-coverage [email or web address] Password [facility]  02/04/2017, 6:14 PM

## 2017-02-04 NOTE — ED Notes (Signed)
Patient transported to X-ray 

## 2017-02-04 NOTE — ED Notes (Signed)
Dr. Winfred Leeds notified of pt BP. Per EDP, will assess pt shortly

## 2017-02-04 NOTE — Progress Notes (Signed)
Patient refused Pulmicort and Brovana breathing treatments. Patient took his home med Symbicort instead. RN is aware. RT will monitor as needed.

## 2017-02-04 NOTE — ED Triage Notes (Addendum)
Pt reports hx of copd, wears home o2.reports chest pain and increase in sob since last night. Has increase in cough with white sputum. ekg done at triage. Skin w/d. Also reports recent abd pain.

## 2017-02-04 NOTE — ED Provider Notes (Signed)
[facility] EMERGENCY DEPARTMENT Provider Note   CSN: [id number] Arrival date & time: 02/04/17  1353     History   Chief Complaint Chief Complaint  Patient presents with  . Shortness of Breath  . Chest Pain    HPI [name] is a 84 y.o. male.  HPI complains of cough productive of white sputum and shortness of breath accompanied by nausea onset 24 hours ago.  He denies any chest pain he does admit to diffuse body aches.  Treated himself with Xopenex yesterday, without relief.  States he felt worse after treatment with Xopenex.  Other symptoms include generalized weakness no other associated symptoms.  Nothing patient does make symptoms better or worse.  Past Medical History:  Diagnosis Date  . Asthma   . BPH (benign prostatic hyperplasia)    severe; s/p multiple biopsies  . CAD (coronary artery disease)   . Carotid artery occlusion   . Chronic rhinitis   . COPD (chronic obstructive pulmonary disease) (HCC)    2L [place] O2  . Hyperlipidemia   . Hypertension   . Peripheral vascular disease ([place])   . 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 Syncope and collapse   . Ureteral tumor 08/2015   had endoscopic procedure for evaluation, unable to reach for biopsy    Patient Active Problem List   Diagnosis Date Noted  . Cor pulmonale, chronic (HCC)/ clinical dx  09/13/2016  . Orthostatic hypotension 09/26/2015  . Angina at rest [facility]) 09/24/2015  . BPH (benign prostatic hyperplasia) 09/13/2015  . GERD (gastroesophageal reflux disease) 09/13/2015  . Anxiety 09/13/2015  . Sinusitis, chronic 09/09/2015  . Dyspnea 04/15/2015  . Acute cor pulmonale ([place]) 03/18/2015  . Right calf pain 06/18/2014  . Coronary artery disease involving coronary bypass graft of native heart with angina pectoris ([place])   . Peripheral vascular disease ([place])   . Essential hypertension   . Obstructive sleep apnea   . Rhinitis, chronic 01/29/2007  . COPD GOLD IV/ 02 dep /steroid dep 01/29/2007  . Chronic  respiratory failure with hypoxia and hypercapnia ([place]) 01/29/2007  . Hyperlipidemia     Past Surgical History:  Procedure Laterality Date  . CATARACT EXTRACTION  11-20-11  . CORONARY ARTERY BYPASS GRAFT  2003  . LITHOTRIPSY    . PROSTATE BIOPSY  Oct. 2014       Home Medications    Prior to Admission medications   Medication Sig Start Date End Date Taking? Authorizing Provider  ALPRAZolam (XANAX) 0.25 MG tablet Take 1 tablet (0.25 mg total) by mouth 3 (three) times daily. Takes 1/2 tablet two times daily. 01/11/17   Tanda Rockers, MD  aspirin 81 MG tablet Take 81 mg by mouth daily.    [provider]  atorvastatin (LIPITOR) 20 MG tablet TAKE 1 TABLET BY MOUTH EVERY DAY 05/24/16   Belva Crome, MD  azithromycin [facility]) 250 MG tablet Take 2 pills today then one a day for 4 additional days 12/06/16   Tanda Rockers, MD  b complex vitamins tablet Take 1 tablet by mouth daily.    [provider]  Cholecalciferol (VITAMIN D3) 2000 units TABS Take 2,000 Units by mouth daily.    [provider]  Cyanocobalamin (VITAMIN B 12 PO) Take 1 tablet daily by mouth.     [provider]  finasteride (PROSCAR) 5 MG tablet Take 5 mg by mouth every morning.    [provider]  fludrocortisone (FLORINEF) 0.1 MG tablet  1/2 tab by mouth as needed for low blood pressure <100    [provider]  furosemide (LASIX) 20 MG tablet Take 20 mg by mouth daily as needed.    [provider]  guaiFENesin (MUCINEX) 600 MG 12 hr tablet Take by mouth 2 (two) times daily as needed.    [provider]  hydrALAZINE (APRESOLINE) 25 MG tablet 1 every 6 hours as needed for high blood pressure > 180    [provider]  mometasone (NASONEX) 50 MCG/ACT nasal spray 2 puffs into the nose in the morning and 1-2 puffs at bedtime 11/04/16   [provider]  mometasone (NASONEX) 50 MCG/ACT nasal spray SHAKE LIQUID AND USE 2 SPRAYS IN EACH  NOSTRIL DAILY 12/25/16   Tanda Rockers, MD  Multiple Vitamins-Minerals (PRESERVISION AREDS 2) CAPS Take 1 capsule by mouth daily.    [provider]  NITROSTAT 0.4 MG SL tablet DISSOLVE 1 TABLET UNDER THE TONGUE AS DIRECTED AND AS NEEDED FOR CHEST PAIN 05/13/15   Belva Crome, MD  OXYGEN Inhale 2 L continuous into the lungs.     [provider]  predniSONE (DELTASONE) 10 MG tablet Take 10 mg by mouth daily with breakfast. If having flare of cough/wheezing: May take 2 daily until better, then 1 daily    [provider]  ranitidine (ZANTAC) 150 MG tablet Take 150 mg by mouth 2 (two) times daily.    [provider]  Sodium Chloride, Hypertonic, (MURO 128 OP) Place 1 drop 4 (four) times daily into both eyes.    [provider]  SPIRIVA RESPIMAT 2.5 MCG/ACT AERS INHALE 2 PUFFS BY MOUTH EVERY DAY 10/05/16   Tanda Rockers, MD  SYMBICORT 160-4.5 MCG/ACT inhaler INHALE 2 PUFFS BY MOUTH EVERY MORNING AND THEN ANOTHER 2 PUFFS ABOUT 12 HOURS LATER 11/15/16   Tanda Rockers, MD  TOPROL XL 25 MG 24 hr tablet TAKE 1 TABLET BY MOUTH DAILY 04/25/16   Belva Crome, MD  XOPENEX HFA 45 MCG/ACT inhaler INHALE 2 PUFFS BY MOUTH EVERY 4 HOURS AS NEEDED FOR WHEEZING OR SHORTNESS OF BREATH 10/05/16   Tanda Rockers, MD  Oxygen 3 L nasal cannula Family History Family History  Problem Relation Age of Onset  . Heart disease Mother   . Breast cancer Mother   . Cancer Mother        Breast cancer  . Hypertension Mother   . Other Mother        AAA  and   Amputation  . Heart disease Father        Before age 3  . Heart attack Father   . Stroke Brother        x3, still living   . Peripheral vascular disease Brother   . Heart disease Brother   . Hyperlipidemia Brother   . Hypertension Brother   . Heart disease Brother   . Allergies Brother     Social History Social History   Tobacco Use  . Smoking status: Former Smoker    Packs/day: 2.00    Years: 33.00    Pack  years: 66.00    Types: Cigarettes    Last attempt to quit: 01/09/1982    Years since quitting: 35.0  . Smokeless tobacco: Never Used  Substance Use Topics  . Alcohol use: Yes    Alcohol/week: 0.0 oz    Comment: 1.5-3 oz daily in the past, minimal use in the last few months  .  Drug use: No     Allergies   Sulfonamide derivatives; Cefdinir; Ciprofloxacin; Nitrofurantoin; and Levaquin [levofloxacin]   Review of Systems Review of Systems  Respiratory: Positive for cough and shortness of breath.   Cardiovascular: Positive for leg swelling.       Chronic leg swelling, greater than 1 year  Gastrointestinal: Positive for nausea.  Musculoskeletal: Positive for myalgias.  Allergic/Immunologic: Positive for immunocompromised state.       Is chronically steroid-dependent  Neurological: Positive for weakness.  All other systems reviewed and are negative.    Physical Exam Updated Vital Signs BP 139/67 (BP Location: Right Arm)   Pulse 96   Temp (!) 100.7 F (38.2 C) (Oral)   Resp (!) 24   SpO2 97%   Physical Exam  Constitutional: He appears well-developed and well-nourished.  HENT:  Head: Normocephalic and atraumatic.  Eyes: Conjunctivae are normal. Pupils are equal, round, and reactive to light.  Neck: Neck supple. No tracheal deviation present. No thyromegaly present.  Cardiovascular: Regular rhythm.  No murmur heard. Tachycardic mildly  Pulmonary/Chest: Effort normal and breath sounds normal.  Abdominal: Soft. Bowel sounds are normal. He exhibits no distension. There is no tenderness.  Musculoskeletal: Normal range of motion. He exhibits edema. He exhibits no tenderness.  2+ leg edema bilaterally  Neurological: He is alert. Coordination normal.  Skin: Skin is warm and dry. No rash noted.  Psychiatric: He has a normal mood and affect.  Nursing note and vitals reviewed.    ED Treatments / Results  Labs (all labs ordered are listed, but only abnormal results are  displayed) Labs Reviewed  CBC - Abnormal; Notable for the following components:      Result Value   RBC 4.05 (*)    All other components within normal limits  CULTURE, BLOOD (ROUTINE X 2)  CULTURE, BLOOD (ROUTINE X 2)  BASIC METABOLIC PANEL  CBC WITH DIFFERENTIAL/PLATELET  INFLUENZA PANEL BY PCR (TYPE A & B)  I-STAT TROPONIN, ED  I-STAT CG4 LACTIC ACID, ED    EKG  EKG Interpretation  Date/Time:  Sunday February 04 2017 14:03:27 EST Ventricular Rate:  109 PR Interval:  146 QRS Duration: 126 QT Interval:  336 QTC Calculation: 452 R Axis:   106 Text Interpretation:  Sinus tachycardia Biatrial enlargement Right bundle branch block Septal infarct , age undetermined Abnormal ECG No significant change since last tracing Confirmed by Orlie Dakin [phone]) on 02/04/2017 3:11:10 PM       Radiology No results found.  Procedures Procedures (including critical care time)  Medications Ordered in ED Medications - No data to display  Chest x-ray viewed by me Results for orders placed or performed during the hospital encounter of 27/25/36  Basic metabolic panel  Result Value Ref Range   Sodium 137 135 - 145 mmol/L   Potassium 4.1 3.5 - 5.1 mmol/L   Chloride 98 (L) 101 - 111 mmol/L   CO2 28 22 - 32 mmol/L   Glucose, Bld 102 (H) 65 - 99 mg/dL   BUN 15 6 - 20 mg/dL   Creatinine, Ser 1.11 0.61 - 1.24 mg/dL   Calcium 9.4 8.9 - 10.3 mg/dL   GFR calc non Af Amer 59 (L) >60 mL/min   GFR calc Af Amer >60 >60 mL/min   Anion gap 11 5 - 15  CBC  Result Value Ref Range   WBC 9.9 4.0 - 10.5 K/uL   RBC 4.05 (L) 4.22 - 5.81 MIL/uL   Hemoglobin 13.2 13.0 - 17.0  g/dL   HCT 39.9 39.0 - 52.0 %   MCV 98.5 78.0 - 100.0 fL   MCH 32.6 26.0 - 34.0 pg   MCHC 33.1 30.0 - 36.0 g/dL   RDW 13.1 11.5 - 15.5 %   Platelets 160 150 - 400 K/uL  CBC WITH DIFFERENTIAL  Result Value Ref Range   WBC 10.1 4.0 - 10.5 K/uL   RBC 4.19 (L) 4.22 - 5.81 MIL/uL   Hemoglobin 13.6 13.0 - 17.0 g/dL   HCT 41.2  39.0 - 52.0 %   MCV 98.3 78.0 - 100.0 fL   MCH 32.5 26.0 - 34.0 pg   MCHC 33.0 30.0 - 36.0 g/dL   RDW 13.1 11.5 - 15.5 %   Platelets 158 150 - 400 K/uL   Neutrophils Relative % 85 %   Neutro Abs 8.6 (H) 1.7 - 7.7 K/uL   Lymphocytes Relative 7 %   Lymphs Abs 0.7 0.7 - 4.0 K/uL   Monocytes Relative 8 %   Monocytes Absolute 0.8 0.1 - 1.0 K/uL   Eosinophils Relative 0 %   Eosinophils Absolute 0.0 0.0 - 0.7 K/uL   Basophils Relative 0 %   Basophils Absolute 0.0 0.0 - 0.1 K/uL  Influenza panel by PCR (type A & B)  Result Value Ref Range   Influenza A By PCR POSITIVE (A) NEGATIVE   Influenza B By PCR NEGATIVE NEGATIVE  I-stat troponin, ED  Result Value Ref Range   Troponin i, poc 0.06 0.00 - 0.08 ng/mL   Comment 3          I-Stat CG4 Lactic Acid, ED  (not at  [facility])  Result Value Ref Range   Lactic Acid, Venous 1.12 0.5 - 1.9 mmol/L   Dg Chest 2 View  Result Date: 02/04/2017 CLINICAL DATA:  Patient with history of COPD. EXAM: CHEST  2 VIEW COMPARISON:  Chest radiograph 01/11/2017 FINDINGS: Monitoring leads overlie the patient. Stable cardiac and mediastinal contours. No consolidative pulmonary opacities. Pulmonary hyperinflation. No pleural effusion or pneumothorax. Thoracic spine degenerative changes. Lateral view nondiagnostic due to overlapping soft tissues superiorly. IMPRESSION: No acute osseous abnormality. Electronically Signed   By: Lovey Newcomer M.D.   On: 02/04/2017 16:31   Dg Chest 2 View  Result Date: 01/11/2017 CLINICAL DATA:  COPD, increased shortness of breath, history COPD, CABG, hypertension, former smoker, asthma EXAM: CHEST  2 VIEW COMPARISON:  10/31/2016 FINDINGS: Normal heart size post CABG. Mediastinal contours and pulmonary vascularity normal. Atherosclerotic calcification aorta. Emphysematous and bronchitic changes consistent with COPD. No acute infiltrate, pleural effusion, or pneumothorax. Bones demineralized. IMPRESSION: Post CABG. COPD changes without acute  infiltrate Electronically Signed   By: Lavonia Dana M.D.   On: 01/11/2017 16:44   Initial Impression / Assessment and Plan / ED Course  I have reviewed the triage vital signs and the nursing notes.  Pertinent labs & imaging results that were available during my care of the patient were reviewed by me and considered in my medical decision making (see chart for details).     Code sepsis called based on sirs criteria of fever, tachypnea, tachycardia.  Source of infection felt to be respiratory 4:05 PM nausea has resolved after treatment with intravenous Reglan  Chest x-ray viewed by me.  Patient is elderly, immunocompromised with hypotension.  I feel that his he should be observed overnight in light of chronic lung disease and mortality risk.  I consulted hospitalist who will arrange for overnight night stay Tamiflu ordered Final Clinical Impressions(s) /  ED Diagnoses  Diagnosis #1 influenza #2 hypotension Final diagnoses:  None    ED Discharge Orders    None       Orlie Dakin, MD 02/04/17 1740

## 2017-02-04 NOTE — Progress Notes (Signed)
Pt new admit from ED Dx Influenza on droplet precaution, alert and oriented x4, with 02nc 2 liters also he is using o2 from home, wife at the bedside.

## 2017-02-04 NOTE — ED Notes (Signed)
Dinner tray ordered.

## 2017-02-05 ENCOUNTER — Telehealth: Payer: Self-pay | Admitting: Internal Medicine

## 2017-02-05 DIAGNOSIS — R54 Age-related physical debility: Secondary | ICD-10-CM | POA: Diagnosis present

## 2017-02-05 DIAGNOSIS — J441 Chronic obstructive pulmonary disease with (acute) exacerbation: Secondary | ICD-10-CM | POA: Diagnosis not present

## 2017-02-05 DIAGNOSIS — I739 Peripheral vascular disease, unspecified: Secondary | ICD-10-CM | POA: Diagnosis present

## 2017-02-05 DIAGNOSIS — R1310 Dysphagia, unspecified: Secondary | ICD-10-CM | POA: Diagnosis present

## 2017-02-05 DIAGNOSIS — K219 Gastro-esophageal reflux disease without esophagitis: Secondary | ICD-10-CM | POA: Diagnosis present

## 2017-02-05 DIAGNOSIS — J9621 Acute and chronic respiratory failure with hypoxia: Secondary | ICD-10-CM | POA: Diagnosis not present

## 2017-02-05 DIAGNOSIS — J9611 Chronic respiratory failure with hypoxia: Secondary | ICD-10-CM | POA: Diagnosis not present

## 2017-02-05 DIAGNOSIS — Z888 Allergy status to other drugs, medicaments and biological substances status: Secondary | ICD-10-CM | POA: Diagnosis not present

## 2017-02-05 DIAGNOSIS — J9612 Chronic respiratory failure with hypercapnia: Secondary | ICD-10-CM | POA: Diagnosis not present

## 2017-02-05 DIAGNOSIS — E785 Hyperlipidemia, unspecified: Secondary | ICD-10-CM | POA: Diagnosis present

## 2017-02-05 DIAGNOSIS — I10 Essential (primary) hypertension: Secondary | ICD-10-CM | POA: Diagnosis present

## 2017-02-05 DIAGNOSIS — J189 Pneumonia, unspecified organism: Secondary | ICD-10-CM | POA: Diagnosis not present

## 2017-02-05 DIAGNOSIS — Z7982 Long term (current) use of aspirin: Secondary | ICD-10-CM | POA: Diagnosis not present

## 2017-02-05 DIAGNOSIS — Z79899 Other long term (current) drug therapy: Secondary | ICD-10-CM | POA: Diagnosis not present

## 2017-02-05 DIAGNOSIS — Z87891 Personal history of nicotine dependence: Secondary | ICD-10-CM | POA: Diagnosis not present

## 2017-02-05 DIAGNOSIS — I159 Secondary hypertension, unspecified: Secondary | ICD-10-CM | POA: Diagnosis not present

## 2017-02-05 DIAGNOSIS — J449 Chronic obstructive pulmonary disease, unspecified: Secondary | ICD-10-CM | POA: Diagnosis not present

## 2017-02-05 DIAGNOSIS — J101 Influenza due to other identified influenza virus with other respiratory manifestations: Secondary | ICD-10-CM | POA: Diagnosis not present

## 2017-02-05 DIAGNOSIS — Z881 Allergy status to other antibiotic agents status: Secondary | ICD-10-CM | POA: Diagnosis not present

## 2017-02-05 DIAGNOSIS — I2510 Atherosclerotic heart disease of native coronary artery without angina pectoris: Secondary | ICD-10-CM | POA: Diagnosis present

## 2017-02-05 DIAGNOSIS — F419 Anxiety disorder, unspecified: Secondary | ICD-10-CM | POA: Diagnosis present

## 2017-02-05 DIAGNOSIS — R600 Localized edema: Secondary | ICD-10-CM | POA: Diagnosis not present

## 2017-02-05 DIAGNOSIS — Z9981 Dependence on supplemental oxygen: Secondary | ICD-10-CM | POA: Diagnosis not present

## 2017-02-05 DIAGNOSIS — R05 Cough: Secondary | ICD-10-CM | POA: Diagnosis not present

## 2017-02-05 DIAGNOSIS — R42 Dizziness and giddiness: Secondary | ICD-10-CM | POA: Diagnosis not present

## 2017-02-05 DIAGNOSIS — I9589 Other hypotension: Secondary | ICD-10-CM | POA: Diagnosis present

## 2017-02-05 DIAGNOSIS — N400 Enlarged prostate without lower urinary tract symptoms: Secondary | ICD-10-CM | POA: Diagnosis present

## 2017-02-05 DIAGNOSIS — Z882 Allergy status to sulfonamides status: Secondary | ICD-10-CM | POA: Diagnosis not present

## 2017-02-05 DIAGNOSIS — E860 Dehydration: Secondary | ICD-10-CM | POA: Diagnosis present

## 2017-02-05 LAB — COMPREHENSIVE METABOLIC PANEL
ALT: 19 U/L (ref 17–63)
AST: 27 U/L (ref 15–41)
Albumin: 3 g/dL — ABNORMAL LOW (ref 3.5–5.0)
Alkaline Phosphatase: 87 U/L (ref 38–126)
Anion gap: 9 (ref 5–15)
BUN: 16 mg/dL (ref 6–20)
CO2: 26 mmol/L (ref 22–32)
Calcium: 8.5 mg/dL — ABNORMAL LOW (ref 8.9–10.3)
Chloride: 102 mmol/L (ref 101–111)
Creatinine, Ser: 0.89 mg/dL (ref 0.61–1.24)
GFR calc Af Amer: >60 mL/min (ref >60)
GFR calc non Af Amer: >60 mL/min (ref >60)
Glucose, Bld: 135 mg/dL — ABNORMAL HIGH (ref 65–99)
Potassium: 4.1 mmol/L (ref 3.5–5.1)
Sodium: 137 mmol/L (ref 135–145)
Total Bilirubin: 0.7 mg/dL (ref 0.3–1.2)
Total Protein: 4.9 g/dL — ABNORMAL LOW (ref 6.5–8.1)

## 2017-02-05 LAB — CBC
HCT: 35.6 % — ABNORMAL LOW (ref 39.0–52.0)
Hemoglobin: 11.7 g/dL — ABNORMAL LOW (ref 13.0–17.0)
MCH: 32.1 pg (ref 26.0–34.0)
MCHC: 32.9 g/dL (ref 30.0–36.0)
MCV: 97.5 fL (ref 78.0–100.0)
Platelets: 153 10*3/uL (ref 150–400)
RBC: 3.65 MIL/uL — ABNORMAL LOW (ref 4.22–5.81)
RDW: 13 % (ref 11.5–15.5)
WBC: 5.3 10*3/uL (ref 4.0–10.5)

## 2017-02-05 MED ORDER — OSELTAMIVIR PHOSPHATE 30 MG PO CAPS
30.0000 mg | ORAL_CAPSULE | Freq: Two times a day (BID) | ORAL | Status: AC
  Administered 2017-02-05 – 2017-02-08 (×8): 30 mg via ORAL
  Filled 2017-02-05 (×10): qty 1

## 2017-02-05 MED ORDER — ALPRAZOLAM 0.25 MG PO TABS
0.2500 mg | ORAL_TABLET | Freq: Once | ORAL | Status: AC
  Administered 2017-02-05: 0.25 mg via ORAL
  Filled 2017-02-05: qty 1

## 2017-02-05 MED ORDER — ORAL CARE MOUTH RINSE
15.0000 mL | Freq: Two times a day (BID) | OROMUCOSAL | Status: DC
  Administered 2017-02-05 – 2017-02-09 (×9): 15 mL via OROMUCOSAL

## 2017-02-05 MED ORDER — ALUM & MAG HYDROXIDE-SIMETH 200-200-20 MG/5ML PO SUSP
15.0000 mL | ORAL | Status: DC | PRN
  Administered 2017-02-05: 15 mL via ORAL
  Filled 2017-02-05: qty 30

## 2017-02-05 MED ORDER — PROSIGHT PO TABS
1.0000 | ORAL_TABLET | Freq: Every day | ORAL | Status: DC
  Administered 2017-02-05 – 2017-02-09 (×5): 1 via ORAL
  Filled 2017-02-05 (×5): qty 1

## 2017-02-05 MED ORDER — METOPROLOL SUCCINATE ER 25 MG PO TB24
25.0000 mg | ORAL_TABLET | Freq: Every day | ORAL | Status: DC
  Administered 2017-02-05 – 2017-02-09 (×5): 25 mg via ORAL
  Filled 2017-02-05 (×5): qty 1

## 2017-02-05 NOTE — Progress Notes (Signed)
PROGRESS NOTE    [name]  PJK:[id number] DOB: [birth year] DOA: 02/04/2017 PCP: [name], MD   Outpatient Specialists:     Brief Narrative:  [name] is a 84 y.o. male with medical history significant of COPD oxygen dependent, CAD status post CABG, BPH, hypertension and GERD presented to the emergency department complaining of generalized weakness and increase in short of breath.  Associated symptoms include headache, body aches, nonproductive cough and fever.  Patient reported that for the past 2 days he has progressively feeling weak and increasing difficulty breathing with exertion.  He notes some discomfort in his chest and use his inhaler without any significant relief.  Patient denies chest pain, palpitations, dizziness, nausea and vomiting.  ED Course: Found to be influenza A+, blood pressure was down to 90/50, fever of 100.7 and tachypneic up to 25.  Normal lactic acid, chest x-ray with no acute abnormalities. Breathing on nasal cannula to 100%. [facility] asked to admit for further observation     Assessment & Plan:   Active Problems:   Influenza A   Influenza A leading to mild COPD exacerbation  - oseltamivir, prescription for prophylaxis called in for his wife from the ER -Was given Rocephin and Azithromycin in the ED, will d/c as etiology is viral.  -Continue O2 supplementation-- wears chronically at home - increase Prednisone to 20 mg during acute process, then reduce to home dose  -continue home regimen  -not eating well-- still looks ill so will continue to watch today  COPD oxygen dependent with mild exacerbation  See Above   Hypotension in setting of viral illness and dehydration  Improved with IVF -resume home meds  Hx of HTN  -resume home meds   CAD s/p CABG  No chest pain -follow with Dr. Wynonia Lawman   BPH - stable  Continue Finasteride      DVT prophylaxis:  Lovenox   Code Status: Full Code   Family Communication: Wife at  bedside  Disposition Plan:  Home in AM if taking in PO better     Subjective: Worried about going home.  Not eating nor getting around well  Objective: Vitals:   02/04/17 2012 02/05/17 0413 02/05/17 1044 02/05/17 1045  BP: (!) 141/80 117/66 (!) 160/81 (!) 170/94  Pulse: 96 86 (!) 108   Resp: 17 17 18    Temp: 98.1 F (36.7 C) (!) 97.5 F (36.4 C) (!) 97.5 F (36.4 C)   TempSrc: Oral Oral Oral   SpO2: 97% 93% 98%   Weight: 57.4 kg (126 lb 8.7 oz)     Height: 5\' 9"  (1.753 m)       Intake/Output Summary (Last 24 hours) at 02/05/2017 1301 Last data filed at 02/05/2017 1100 Gross per 24 hour  Intake 3617.17 ml  Output -  Net 3617.17 ml   Filed Weights   02/04/17 2012  Weight: 57.4 kg (126 lb 8.7 oz)    Examination:  General exam: frail/ill appearing Respiratory system: diminished, not moving much air Cardiovascular system: mildy tachy Gastrointestinal system: +BS, soft Central nervous system: Alert- hard of hearing Skin: areas of bruising     Data Reviewed: I have personally reviewed following labs and imaging studies  CBC: Recent Labs  Lab 02/04/17 1429 02/04/17 1513 02/05/17 0732  WBC 9.9 10.1 5.3  NEUTROABS  --  8.6*  --   HGB 13.2 13.6 11.7*  HCT 39.9 41.2 35.6*  MCV 98.5 98.3 97.5  PLT 160 158 153   Basic  Metabolic Panel: Recent Labs  Lab 02/04/17 1429 02/05/17 0732  NA 137 137  K 4.1 4.1  CL 98* 102  CO2 28 26  GLUCOSE 102* 135*  BUN 15 16  CREATININE 1.11 0.89  CALCIUM 9.4 8.5*   GFR: Estimated Creatinine Clearance: 49.3 mL/min (by C-G formula based on SCr of 0.89 mg/dL). Liver Function Tests: Recent Labs  Lab 02/05/17 0732  AST 27  ALT 19  ALKPHOS 87  BILITOT 0.7  PROT 4.9*  ALBUMIN 3.0*   No results for input(s): LIPASE, AMYLASE in the last 168 hours. No results for input(s): AMMONIA in the last 168 hours. Coagulation Profile: No results for input(s): INR, PROTIME in the last 168 hours. Cardiac Enzymes: No results for  input(s): CKTOTAL, CKMB, CKMBINDEX, TROPONINI in the last 168 hours. BNP (last 3 results) No results for input(s): PROBNP in the last 8760 hours. HbA1C: No results for input(s): HGBA1C in the last 72 hours. CBG: No results for input(s): GLUCAP in the last 168 hours. Lipid Profile: No results for input(s): CHOL, HDL, LDLCALC, TRIG, CHOLHDL, LDLDIRECT in the last 72 hours. Thyroid Function Tests: No results for input(s): TSH, T4TOTAL, FREET4, T3FREE, THYROIDAB in the last 72 hours. Anemia Panel: No results for input(s): VITAMINB12, FOLATE, FERRITIN, TIBC, IRON, RETICCTPCT in the last 72 hours. Urine analysis:    Component Value Date/Time   COLORURINE YELLOW 09/12/2015 2246   APPEARANCEUR CLEAR 09/12/2015 2246   LABSPEC 1.009 09/12/2015 2246   PHURINE 7.0 09/12/2015 2246   GLUCOSEU NEGATIVE 09/12/2015 2246   HGBUR NEGATIVE 09/12/2015 2246   BILIRUBINUR NEGATIVE 09/12/2015 2246   KETONESUR NEGATIVE 09/12/2015 2246   PROTEINUR NEGATIVE 09/12/2015 2246   NITRITE NEGATIVE 09/12/2015 2246   LEUKOCYTESUR NEGATIVE 09/12/2015 2246     )No results found for this or any previous visit (from the past 240 hour(s)).    Anti-infectives (From admission, onward)   Start     Dose/Rate Route Frequency Ordered Stop   02/05/17 1100  oseltamivir (TAMIFLU) capsule 30 mg     30 mg Oral 2 times daily 02/05/17 1059 03/11/20 0959   02/04/17 1745  oseltamivir (TAMIFLU) capsule 75 mg     75 mg Oral  Once 02/04/17 1737 02/04/17 1832   02/04/17 1530  cefTRIAXone (ROCEPHIN) 1 g in dextrose 5 % 50 mL IVPB     1 g 100 mL/hr over 30 Minutes Intravenous  Once 02/04/17 1516 02/04/17 1659   02/04/17 1530  azithromycin (ZITHROMAX) 500 mg in dextrose 5 % 250 mL IVPB     500 mg 250 mL/hr over 60 Minutes Intravenous  Once 02/04/17 1516 02/04/17 1703       Radiology Studies: Dg Chest 2 View  Result Date: 02/04/2017 CLINICAL DATA:  Patient with history of COPD. EXAM: CHEST  2 VIEW COMPARISON:  Chest radiograph  01/11/2017 FINDINGS: Monitoring leads overlie the patient. Stable cardiac and mediastinal contours. No consolidative pulmonary opacities. Pulmonary hyperinflation. No pleural effusion or pneumothorax. Thoracic spine degenerative changes. Lateral view nondiagnostic due to overlapping soft tissues superiorly. IMPRESSION: No acute osseous abnormality. Electronically Signed   By: Lovey Newcomer M.D.   On: 02/04/2017 16:31        Scheduled Meds: . ALPRAZolam  0.25 mg Oral TID  . aspirin  81 mg Oral Daily  . atorvastatin  20 mg Oral Daily  . B-complex with vitamin C  1 tablet Oral Daily  . cholecalciferol  2,000 Units Oral Daily  . enoxaparin (LOVENOX) injection  40 mg Subcutaneous  Q24H  . famotidine  20 mg Oral BID  . finasteride  5 mg Oral q morning - 10a  . fluticasone  2 spray Each Nare BID  . mouth rinse  15 mL Mouth Rinse q12n4p  . metoprolol succinate  25 mg Oral Daily  . multivitamin  1 tablet Oral Daily  . oseltamivir  30 mg Oral BID  . predniSONE  20 mg Oral Q breakfast  . sodium chloride flush  3 mL Intravenous Q12H  . tiotropium  18 mcg Inhalation Daily   Continuous Infusions:   LOS: 0 days    Time spent: 25 min    Geradine Girt, DO [facility] Pager [phone]  If 7PM-7AM, please contact night-coverage [email or web address] Password [email or web address] 02/05/2017, 1:01 PM
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
## 2017-02-05 NOTE — Telephone Encounter (Signed)
lmtcb x1 for pt. 

## 2017-02-05 NOTE — Addendum Note (Signed)
Encounter addended by: Lance Morin, RN on: 02/05/2017 9:42 AM  Actions taken: Sign clinical note, Episode resolved

## 2017-02-05 NOTE — Evaluation (Signed)
Clinical/Bedside Swallow Evaluation Patient Details  Name: Travis TESCHNER MRN: [id number] Date of Birth: [birth year]  Today's Date: 02/05/2017 Time: SLP Start Time (ACUTE ONLY): 1029 SLP Stop Time (ACUTE ONLY): 1105 SLP Time Calculation (min) (ACUTE ONLY): 36 min  Past Medical History:  Past Medical History:  Diagnosis Date  . Asthma   . BPH (benign prostatic hyperplasia)    severe; s/p multiple biopsies  . CAD (coronary artery disease)   . Carotid artery occlusion   . Chronic rhinitis   . COPD (chronic obstructive pulmonary disease) (HCC)    2L [place] O2  . Hyperlipidemia   . Hypertension   . Peripheral vascular disease ([place])   . 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 Syncope and collapse   . Ureteral tumor 08/2015   had endoscopic procedure for evaluation, unable to reach for biopsy   Past Surgical History:  Past Surgical History:  Procedure Laterality Date  . CATARACT EXTRACTION  11-20-11  . CORONARY ARTERY BYPASS GRAFT  2003  . LITHOTRIPSY    . PROSTATE BIOPSY  Oct. 2014   HPI:  Pt is a 84 y.o.male who presented to ED with shortness of breath and chest pain. PMHx includes COPD, CAD, GERD, hypertension, chronic rhinitis, and hyperlipidemia. CXR (1/27) was clear for consolidative pulmonary opacities. Previous MBS completed post extubation in 2003 revealed functional swallow w/ no aspiration/penentration. Similar unremarkable swallowing mechanisim indicated in Upper GI imaging completed in 2016.   Assessment / Plan / Recommendation Clinical Impression   Pt noted to have one slight cough following 1/4 thin liquid trials; however, pt reported the cough was related to baseline congestion, and no other signs of aspiration or overt difficulty swallowing. Pt reports globus sensation with solids and infrequent delayed coughing after meals over the past months, possibly related to history of reflux. Recommend GI/esophageal work up if pt's symptoms persist/worsen. Recommend continued regular solid and thin  liquid diet with aspiration and esophageal precautions including small, slow bites, alternating liquids and solids, softer food options. SLP will briefly follow pt to monitor for increased coughing/signs of aspiration and diet tollerance.        SLP Visit Diagnosis: Dysphagia, unspecified (R13.10)    Aspiration Risk  Mild aspiration risk    Diet Recommendation Regular;Thin liquid   Liquid Administration via: Cup Medication Administration: Whole meds with liquid(as tolerated) Supervision: Patient able to self feed Compensations: Small sips/bites;Follow solids with liquid Postural Changes: Remain upright for at least 30 minutes after po intake    Other  Recommendations Recommended Consults: Consider GI evaluation;Consider esophageal assessment Oral Care Recommendations: Oral care BID   Follow up Recommendations None      Frequency and Duration min 2x/week  1 week       Prognosis        Swallow Study   General HPI: Pt is a 84 y.o.male who presented to ED with shortness of breath and chest pain. PMHx includes COPD, CAD, GERD, hypertension, chronic rhinitis, and hyperlipidemia. CXR (1/27) was clear for consolidative pulmonary opacities. Previous MBS completed post extubation in 2003 revealed functional swallow w/ no aspiration/penentration. Similar unremarkable swallowing mechanisim indicated in Upper GI imaging completed in 2016. Type of Study: Bedside Swallow Evaluation Previous Swallow Assessment: Refer to HPI Diet Prior to this Study: Regular;Thin liquids Temperature Spikes Noted: No Respiratory Status: Nasal cannula History of Recent Intubation: No Behavior/Cognition: Alert;Cooperative;Pleasant mood Oral Cavity Assessment: Within Functional Limits Oral Care Completed by SLP: No Oral Cavity - Dentition: Adequate natural dentition Vision: Functional  for self-feeding Self-Feeding Abilities: Able to feed self Patient Positioning: Upright in bed Baseline Vocal Quality:  Normal Volitional Cough: Congested;Strong Volitional Swallow: Able to elicit    Oral/Motor/Sensory Function Overall Oral Motor/Sensory Function: Within functional limits   Ice Chips Ice chips: Not tested   Thin Liquid Thin Liquid: Within functional limits Presentation: Cup(pt reports not liking straw)    Nectar Thick Nectar Thick Liquid: Not tested   Honey Thick Honey Thick Liquid: Not tested   Puree Puree: Within functional limits Presentation: Spoon;Self Fed   Solid   GO   Solid: Within functional limits Presentation: Self Fed        [name] 02/05/2017,11:48 AM   [name] SLP Student Clinician

## 2017-02-05 NOTE — Progress Notes (Signed)
Patient refused pulmicort and brovana treatments. Said he feels uncomfortable changing medications without speaking with his doctor. Patient's nasal cannula was also hooked up to room air and sats were 91%. Placed patient on oxygen, sats now 97%.

## 2017-02-06 ENCOUNTER — Inpatient Hospital Stay (HOSPITAL_COMMUNITY): Payer: Medicare Other

## 2017-02-06 DIAGNOSIS — J449 Chronic obstructive pulmonary disease, unspecified: Secondary | ICD-10-CM

## 2017-02-06 DIAGNOSIS — I159 Secondary hypertension, unspecified: Secondary | ICD-10-CM

## 2017-02-06 DIAGNOSIS — R42 Dizziness and giddiness: Secondary | ICD-10-CM

## 2017-02-06 MED ORDER — PREDNISONE 20 MG PO TABS
40.0000 mg | ORAL_TABLET | Freq: Every day | ORAL | Status: DC
  Administered 2017-02-06: 40 mg via ORAL
  Filled 2017-02-06 (×2): qty 2

## 2017-02-06 MED ORDER — ALPRAZOLAM 0.25 MG PO TABS
0.1250 mg | ORAL_TABLET | Freq: Three times a day (TID) | ORAL | Status: DC
  Administered 2017-02-06 – 2017-02-09 (×9): 0.125 mg via ORAL
  Filled 2017-02-06 (×9): qty 1

## 2017-02-06 NOTE — Telephone Encounter (Signed)
Called pt and advised message from the provider. Pt understood and verbalized understanding. He is still in the hospital and will call back if he is released today. Will hold open until he calls back.

## 2017-02-06 NOTE — Telephone Encounter (Signed)
Spoke with pt, he states he was placed on Pulmicort and Brovana twice a day by nebulizer instead of the Symbicort. He wanted to see if MW is ok with this. He wants to come in for a hospital follow up in the next week but there aren't any openings available. MW please advise.   Patient Instructions by Tanda Rockers, MD at 01/11/2017 11:00 AM   Author: Tanda Rockers, MD Author Type: Physician Filed: 01/11/2017 11:59 AM  Note Status: Addendum Cosign: Cosign Not Required Encounter Date: 01/11/2017  Editor: Tanda Rockers, MD (Physician)  Prior Versions: 1. Tanda Rockers, MD (Physician) at 01/11/2017 11:58 AM - Addendum   2. Tanda Rockers, MD (Physician) at 01/11/2017 11:54 AM - Signed    Stop the nocturnal ventilator  We will make sure your 02 is humidified 02  Change xanax to .25 mg on whole pill  three times a day as per the med calendar   Please remember to go to the  x-ray department downstairs in the basement  for your tests - we will call you with the results when they are available.   Please schedule a follow up visit in 3 months but call sooner if needed

## 2017-02-06 NOTE — Progress Notes (Signed)
I called x-ray dept said within 45 min they will do the chest x-ray.

## 2017-02-06 NOTE — Care Management Note (Addendum)
Case Management Note  Patient Details  Name: [name] MRN: [id number] Date of Birth: [birth year]  Subjective/Objective:                    Action/Plan:  Patient from home with home oxygen through [place] . Patient has portable tank at bedside, however it is empty, he will need one at discharge from [facility]     Expected Discharge Date:  02/06/17               Expected Discharge Plan:  Home/Self Care  In-House Referral:     Discharge planning Services     Post Acute Care Choice:  Durable Medical Equipment Choice offered to:  Patient, Spouse  DME Arranged:  N/A DME Agency:  NA  HH Arranged:  NA HH Agency:  NA  Status of Service:  Completed, signed off  If discussed at [place] of Stay Meetings, dates discussed:    Additional Comments:  Marilu Favre, RN 02/06/2017, 11:37 AM

## 2017-02-06 NOTE — Telephone Encounter (Signed)
Ok to add on

## 2017-02-06 NOTE — Progress Notes (Signed)
RT placed pt on home CPAP unit and is tolerating well at this time. RT will continue to monitor as needed.

## 2017-02-06 NOTE — Progress Notes (Addendum)
PROGRESS NOTE    [name]  ZTI:[id number] DOB: 08-16-1929 DOA: 02/04/2017 PCP: [name], MD   Outpatient Specialists:     Brief Narrative:  [name] is a 84 y.o. male with medical history significant of COPD oxygen dependent, CAD status post CABG, BPH, hypertension and GERD presented to the emergency department complaining of generalized weakness and increase in short of breath.  Associated symptoms include headache, body aches, nonproductive cough and fever.  Patient reported that for the past 2 days he has progressively feeling weak and increasing difficulty breathing with exertion.  He notes some discomfort in his chest and use his inhaler without any significant relief.  Patient denies chest pain, palpitations, dizziness, nausea and vomiting.  ED Course: Found to be influenza A+, blood pressure was down to 90/50, fever of 100.7 and tachypneic up to 25.  Normal lactic acid, chest x-ray with no acute abnormalities. Breathing on nasal cannula to 100%. [facility] asked to admit for further observation     Assessment & Plan:   Active Problems:   Influenza A   Influenza A leading to COPD exacerbation (end stage COPD) - oseltamivir, prescription for prophylaxis called in for his wife from the ER -Was given Rocephin and Azithromycin in the ED, will d/c as etiology is viral.  -Continue O2 supplementation-- wears chronically at home - increase Prednisone -- wean to home regimen -recheck x ray today after rehydration to r/o PNA  Dizziness -orthostatics -discussed with pharmacy- doubt tamiflu -suspect related to xanax (pateint was instructed by Pulm to increase xanax but did not, here we have been giving him the higher dose) -PT eval for BPPV  COPD oxygen dependent (end stage per pulm)/Acute on Chronic Hypoxic Respiratory Failure See Above   Hypotension in setting of viral illness and dehydration  Improved with IVF -resume home meds  Hx of HTN  -resume home meds     CAD s/p CABG  No chest pain -follow with Dr. Wynonia Lawman   BPH - stable  Continue Finasteride      DVT prophylaxis:  Lovenox   Code Status: Full Code   Family Communication: Wife at bedside  Disposition Plan:  ? D/c in AM     Subjective: Now c/o dizziness Seems anxious about going home  Objective: Vitals:   02/05/17 1300 02/05/17 2125 02/06/17 0413 02/06/17 0857  BP: 119/61 (!) 145/87 128/76   Pulse: 90 81 (!) 59   Resp: 18 16 17    Temp: 98 F (36.7 C) 97.6 F (36.4 C) 98.1 F (36.7 C)   TempSrc: Oral Oral    SpO2: 96% 98% 98% 96%  Weight:      Height:        Intake/Output Summary (Last 24 hours) at 02/06/2017 1319 Last data filed at 02/06/2017 1100 Gross per 24 hour  Intake 603 ml  Output 900 ml  Net -297 ml   Filed Weights   02/04/17 2012  Weight: 57.4 kg (126 lb 8.7 oz)    Examination:  General exam: frail appearing Respiratory system: forced expiratory wheezing Cardiovascular system: rrr, min LE edema Gastrointestinal system: +Bs, soft Central nervous system: alert- hard of hearing      Data Reviewed: I have personally reviewed following labs and imaging studies  CBC: Recent Labs  Lab 02/04/17 1429 02/04/17 1513 02/05/17 0732  WBC 9.9 10.1 5.3  NEUTROABS  --  8.6*  --   HGB 13.2 13.6 11.7*  HCT 39.9 41.2 35.6*  MCV 98.5 98.3 97.5  PLT 160 158 376   Basic Metabolic Panel: Recent Labs  Lab 02/04/17 1429 02/05/17 0732  NA 137 137  K 4.1 4.1  CL 98* 102  CO2 28 26  GLUCOSE 102* 135*  BUN 15 16  CREATININE 1.11 0.89  CALCIUM 9.4 8.5*   GFR: Estimated Creatinine Clearance: 49.3 mL/min (by C-G formula based on SCr of 0.89 mg/dL). Liver Function Tests: Recent Labs  Lab 02/05/17 0732  AST 27  ALT 19  ALKPHOS 87  BILITOT 0.7  PROT 4.9*  ALBUMIN 3.0*   No results for input(s): LIPASE, AMYLASE in the last 168 hours. No results for input(s): AMMONIA in the last 168 hours. Coagulation Profile: No results for  input(s): INR, PROTIME in the last 168 hours. Cardiac Enzymes: No results for input(s): CKTOTAL, CKMB, CKMBINDEX, TROPONINI in the last 168 hours. BNP (last 3 results) No results for input(s): PROBNP in the last 8760 hours. HbA1C: No results for input(s): HGBA1C in the last 72 hours. CBG: No results for input(s): GLUCAP in the last 168 hours. Lipid Profile: No results for input(s): CHOL, HDL, LDLCALC, TRIG, CHOLHDL, LDLDIRECT in the last 72 hours. Thyroid Function Tests: No results for input(s): TSH, T4TOTAL, FREET4, T3FREE, THYROIDAB in the last 72 hours. Anemia Panel: No results for input(s): VITAMINB12, FOLATE, FERRITIN, TIBC, IRON, RETICCTPCT in the last 72 hours. Urine analysis:    Component Value Date/Time   COLORURINE YELLOW 09/12/2015 2246   APPEARANCEUR CLEAR 09/12/2015 2246   LABSPEC 1.009 09/12/2015 2246   PHURINE 7.0 09/12/2015 2246   GLUCOSEU NEGATIVE 09/12/2015 2246   HGBUR NEGATIVE 09/12/2015 2246   BILIRUBINUR NEGATIVE 09/12/2015 2246   KETONESUR NEGATIVE 09/12/2015 2246   PROTEINUR NEGATIVE 09/12/2015 2246   NITRITE NEGATIVE 09/12/2015 2246   LEUKOCYTESUR NEGATIVE 09/12/2015 2246     ) Recent Results (from the past 240 hour(s))  Blood Culture (routine x 2)     Status: None (Preliminary result)   Collection Time: 02/04/17  3:22 PM  Result Value Ref Range Status   Specimen Description BLOOD RIGHT FOREARM  Final   Special Requests   Final    BOTTLES DRAWN AEROBIC AND ANAEROBIC Blood Culture adequate volume   Culture NO GROWTH 2 DAYS  Final   Report Status PENDING  Incomplete  Blood Culture (routine x 2)     Status: None (Preliminary result)   Collection Time: 02/04/17  3:31 PM  Result Value Ref Range Status   Specimen Description BLOOD LEFT WRIST  Final   Special Requests   Final    BOTTLES DRAWN AEROBIC AND ANAEROBIC Blood Culture adequate volume   Culture NO GROWTH 2 DAYS  Final   Report Status PENDING  Incomplete      Anti-infectives (From  admission, onward)   Start     Dose/Rate Route Frequency Ordered Stop   02/05/17 1100  oseltamivir (TAMIFLU) capsule 30 mg     30 mg Oral 2 times daily 02/05/17 1059 03/11/20 0959   02/04/17 1745  oseltamivir (TAMIFLU) capsule 75 mg     75 mg Oral  Once 02/04/17 1737 02/04/17 1832   02/04/17 1530  cefTRIAXone (ROCEPHIN) 1 g in dextrose 5 % 50 mL IVPB     1 g 100 mL/hr over 30 Minutes Intravenous  Once 02/04/17 1516 02/04/17 1659   02/04/17 1530  azithromycin (ZITHROMAX) 500 mg in dextrose 5 % 250 mL IVPB     500 mg 250 mL/hr over 60 Minutes Intravenous  Once 02/04/17 1516 02/04/17 1703  Radiology Studies: Dg Chest 2 View  Result Date: 02/04/2017 CLINICAL DATA:  Patient with history of COPD. EXAM: CHEST  2 VIEW COMPARISON:  Chest radiograph 01/11/2017 FINDINGS: Monitoring leads overlie the patient. Stable cardiac and mediastinal contours. No consolidative pulmonary opacities. Pulmonary hyperinflation. No pleural effusion or pneumothorax. Thoracic spine degenerative changes. Lateral view nondiagnostic due to overlapping soft tissues superiorly. IMPRESSION: No acute osseous abnormality. Electronically Signed   By: Lovey Newcomer M.D.   On: 02/04/2017 16:31        Scheduled Meds: . ALPRAZolam  0.125 mg Oral TID  . aspirin  81 mg Oral Daily  . atorvastatin  20 mg Oral Daily  . B-complex with vitamin C  1 tablet Oral Daily  . cholecalciferol  2,000 Units Oral Daily  . enoxaparin (LOVENOX) injection  40 mg Subcutaneous Q24H  . famotidine  20 mg Oral BID  . finasteride  5 mg Oral q morning - 10a  . fluticasone  2 spray Each Nare BID  . mouth rinse  15 mL Mouth Rinse q12n4p  . metoprolol succinate  25 mg Oral Daily  . multivitamin  1 tablet Oral Daily  . oseltamivir  30 mg Oral BID  . predniSONE  40 mg Oral Q breakfast  . sodium chloride flush  3 mL Intravenous Q12H  . tiotropium  18 mcg Inhalation Daily   Continuous Infusions:   LOS: 1 day    Time spent: 25  min    Geradine Girt, DO [facility] Pager [phone]  If 7PM-7AM, please contact night-coverage [email or web address] Password [email or web address] 02/06/2017, 1:19 PM

## 2017-02-07 ENCOUNTER — Telehealth: Payer: Self-pay | Admitting: Internal Medicine

## 2017-02-07 DIAGNOSIS — J101 Influenza due to other identified influenza virus with other respiratory manifestations: Principal | ICD-10-CM

## 2017-02-07 DIAGNOSIS — J441 Chronic obstructive pulmonary disease with (acute) exacerbation: Secondary | ICD-10-CM

## 2017-02-07 MED ORDER — AZITHROMYCIN 250 MG PO TABS: 500.0000 mg | ORAL_TABLET | Freq: Every day | ORAL | Status: DC

## 2017-02-07 MED ORDER — METHYLPREDNISOLONE SODIUM SUCC 40 MG IJ SOLR
40.0000 mg | Freq: Once | INTRAMUSCULAR | Status: AC
  Administered 2017-02-07: 40 mg via INTRAVENOUS
  Filled 2017-02-07: qty 1

## 2017-02-07 MED ORDER — DEXTROSE 5 % IV SOLN
1.0000 g | INTRAVENOUS | Status: DC
  Administered 2017-02-08: 1 g via INTRAVENOUS
  Filled 2017-02-07 (×3): qty 10

## 2017-02-07 MED ORDER — PREDNISONE 20 MG PO TABS
20.0000 mg | ORAL_TABLET | Freq: Every day | ORAL | Status: DC
  Administered 2017-02-07 – 2017-02-08 (×2): 20 mg via ORAL
  Filled 2017-02-07: qty 1

## 2017-02-07 NOTE — Care Management (Signed)
Ordered portable oxygen tank through [facility] to be delivered to room , in case discharge is today.   Magdalen Spatz RN BSN [phone]

## 2017-02-07 NOTE — Progress Notes (Signed)
Pt stated he was able to place self on CPAP tonight. RT made pt aware if he needed help to let nurse know and they will call RT. Rt will continue to monitor as needed.

## 2017-02-07 NOTE — Progress Notes (Signed)
  Speech Language Pathology Treatment: Dysphagia  Patient Details Name: [name] MRN: [id number] DOB: 1929-11-26 Today's Date: 02/07/2017 Time: 8264-1583 SLP Time Calculation (min) (ACUTE ONLY): 16 min  Assessment / Plan / Recommendation Clinical Impression  Pt subjectively reports feeling better about his swallowing, denying any difficulties with his meals which included a "deli sandwich" at lunch today. He has a baseline congested cough, but did not appear to cough immediately after any PO trials. He and his wife agree that although he has not noticed any increase in coughing during meals, sometimes he will have coughing that starts after he has finished a meal. Again, this along with his other concerns about solids getting "stuck" may be more representative of esophageal issues. Recommend continuing with current diet - SLP reviewed aspiration and esophageal precautions with pt and his wife. No further acute SLP needs identified, but encouraged pt to f/u with his PCP post-discharge should he notice any changes or persistent issues with his swallowing.   HPI HPI: Pt is a 84 y.o.male who presented to ED with shortness of breath and chest pain. PMHx includes COPD, CAD, GERD, hypertension, chronic rhinitis, and hyperlipidemia. CXR (1/27) was clear for consolidative pulmonary opacities. Previous MBS completed post extubation in 2003 revealed functional swallow w/ no aspiration/penentration. Similar unremarkable swallowing mechanisim indicated in Upper GI imaging completed in 2016.      SLP Plan  All goals met       Recommendations  Diet recommendations: Regular;Thin liquid Liquids provided via: Cup;Straw Medication Administration: Whole meds with liquid Supervision: Patient able to self feed;Intermittent supervision to cue for compensatory strategies Compensations: Small sips/bites;Follow solids with liquid Postural Changes and/or Swallow Maneuvers: Seated upright 90 degrees;Upright 30-60  min after meal                Oral Care Recommendations: Oral care BID Follow up Recommendations: None SLP Visit Diagnosis: Dysphagia, unspecified (R13.10) Plan: All goals met       GO                Germain Osgood 02/07/2017, 3:09 PM  Germain Osgood, M.A. CCC-SLP [phone]

## 2017-02-07 NOTE — Telephone Encounter (Signed)
Looked at pt's chart and as of today, pt is still in the hospital.

## 2017-02-07 NOTE — Telephone Encounter (Signed)
Attempted to contact [facility]. I did not get an answer and I could not leave a message. We do not have pt on Trelegy per his medication sheet.

## 2017-02-07 NOTE — Telephone Encounter (Signed)
Called and spoke with pt. Pt states he his currently admitted.  Pt is concerned about two medications that the physician has mentioned starting pt on.  Pt states the two medications are Rocephim & solu-medrol. Pt wanted to make sure that MW agreed with this treatment.  MW please advise. Thanks.

## 2017-02-07 NOTE — Evaluation (Signed)
Physical Therapy Evaluation Patient Details Name: [name] MRN: [id number] DOB: December 13, 1929 Today's Date: 02/07/2017   History of Present Illness  84 y.o. male admitted on 02/04/17 for weakness and SOB found to have the flu.  Pt with significant PMH of syncope, CABG, PVD, HTN, COPD, carotid artery occlusion, CAD, asthma, and chronic rhinitis.  Clinical Impression  Evaluation complete.  Pt does not show signs of vestibular dysfunction, orthostatic vitals were taken and were negative, he does have increased WOB (DOE 3/4 with gait) despite O2 sats 99-100% on 2 L O2 [place] during short, hallway ambulation.  He is generally weak and deconditioned with less activity tolerance than normal.  He was able to walk 50' down the hallway, sit and rest, and return back.  I would recommend HH therapies at discharge with transition to OP pulmonary rehab when he has gotten back to his baseline.   PT to follow acutely for deficits listed below.     Follow Up Recommendations Home health PT;Supervision for mobility/OOB    Equipment Recommendations  None recommended by PT    Recommendations for Other Services OT consult     Precautions / Restrictions Precautions Precautions: Other (comment) Precaution Comments: monitor O2 sats and DOE with gait      Mobility  Bed Mobility Overal bed mobility: Modified Independent                Transfers Overall transfer level: Needs assistance Equipment used: None Transfers: Sit to/from Stand Sit to Stand: Supervision         General transfer comment: supervision for safety.  Took orthostatics EOB  Ambulation/Gait Ambulation/Gait assistance: Supervision Ambulation Distance (Feet): 50 Feet(x2 with 5 min seated rest break ) Assistive device: None Gait Pattern/deviations: Step-through pattern;Staggering left;Staggering right Gait velocity: decreased Gait velocity interpretation: Below normal speed for age/gender General Gait Details: very mildly  staggering gait pattern, pt reports not much walking since being here in the hospital, and that he does better with his shoes on.  Supervision for safety and monitoring of vitals.       Balance Overall balance assessment: Needs assistance Sitting-balance support: Feet supported;No upper extremity supported Sitting balance-Leahy Scale: Good     Standing balance support: No upper extremity supported Standing balance-Leahy Scale: Good                               Pertinent Vitals/Pain Pain Assessment: No/denies pain    Home Living Family/patient expects to be discharged to:: Private residence Living Arrangements: Spouse/significant other Available Help at Discharge: Family;Available 24 hours/day Type of Home: House       Home Layout: Multi-level Home Equipment: Other (comment)(home oxygen, 2 L O2 [place] at baseline) Additional Comments: Pt reports he has a BP monitor and O2 sat reader at home.     Prior Function Level of Independence: Independent         Comments: Per pt report he walks on the treadmill daily for 5 mins at a time at 1.0 mph with a seated rest break every 5 mins until he works up to 15 mins.       Hand Dominance   Dominant Hand: Right    Extremity/Trunk Assessment   Upper Extremity Assessment Upper Extremity Assessment: Generalized weakness    Lower Extremity Assessment Lower Extremity Assessment: Generalized weakness(with chronic LE edema)    Cervical / Trunk Assessment Cervical / Trunk Assessment: Kyphotic  Communication   Communication: [facility]  Cognition Arousal/Alertness: Awake/alert Behavior During Therapy: WFL for tasks assessed/performed Overall Cognitive Status: Within Functional Limits for tasks assessed                                               Assessment/Plan    PT Assessment Patient needs continued PT services  PT Problem List Decreased strength;Decreased activity tolerance;Decreased  balance;Decreased mobility;Decreased knowledge of use of DME;Cardiopulmonary status limiting activity       PT Treatment Interventions DME instruction;Gait training;Stair training;Functional mobility training;Therapeutic activities;Therapeutic exercise;Patient/family education    PT Goals (Current goals can be found in the Care Plan section)  Acute Rehab PT Goals Patient Stated Goal: to get back to his baseline and try to return to OP pulmonary rehab when he can PT Goal Formulation: With patient/family Time For Goal Achievement: 02/21/17 Potential to Achieve Goals: Good    Frequency Min 3X/week   Barriers to discharge Inaccessible home environment Pt lives in a three level home.       AM-PAC PT "6 Clicks" Daily Activity  Outcome Measure Difficulty turning over in bed (including adjusting bedclothes, sheets and blankets)?: None Difficulty moving from lying on back to sitting on the side of the bed? : None Difficulty sitting down on and standing up from a chair with arms (e.g., wheelchair, bedside commode, etc,.)?: None Help needed moving to and from a bed to chair (including a wheelchair)?: None Help needed walking in hospital room?: None Help needed climbing 3-5 steps with a railing? : A Little 6 Click Score: 23    End of Session Equipment Utilized During Treatment: Oxygen(2 L O2 [place]) Activity Tolerance: Patient limited by fatigue;Other (comment)(limited by DOE) Patient left: in bed;Other (comment);with family/visitor present(seated EOB) Nurse Communication: Mobility status PT Visit Diagnosis: Unsteadiness on feet (R26.81);Muscle weakness (generalized) (M62.81)    Time: 5329-9242 PT Time Calculation (min) (ACUTE ONLY): 56 min   Charges:            Wells Guiles B. Addisen Chappelle, PT, DPT [phone]   PT Evaluation $PT Eval Moderate Complexity: 1 Mod PT Treatments $Gait Training: 8-22 mins $Self Care/Home Management: 8-22   02/07/2017, 1:18 PM

## 2017-02-07 NOTE — Care Management Note (Addendum)
Case Management Note  Patient Details  Name: [name] MRN: [id number] Date of Birth: 11-Sep-1929  Subjective/Objective:                    Action/Plan:   Expected Discharge Date:  02/06/17               Expected Discharge Plan:  [place]  In-House Referral:     Discharge planning Services  CM Consult  Post Acute Care Choice:  Durable Medical Equipment, Home Health Choice offered to:  Patient, Spouse  DME Arranged:  N/A DME Agency:  NA  HH Arranged:  RN, PT HH Agency:  [facility]  Status of Service:  Completed, signed off  If discussed at [place] of Stay Meetings, dates discussed:    Additional Comments:  Marilu Favre, RN 02/07/2017, 2:20 PM

## 2017-02-07 NOTE — Telephone Encounter (Signed)
Called pt letting him know the meds the physician at the hospital wanted to start him on would be fine for him to take along with the current meds he is taking.  Pt expressed understanding. Nothing further needed at this current time.

## 2017-02-07 NOTE — Progress Notes (Signed)
PROGRESS NOTE    [name]  OVF:[id number] DOB: [birth year] DOA: 02/04/2017 PCP: [name], MD   Outpatient Specialists:     Brief Narrative:  [name] is a 84 y.o. male with medical history significant of COPD oxygen dependent, CAD status post CABG, BPH, hypertension and GERD presented to the emergency department complaining of generalized weakness and increase in short of breath.  Associated symptoms include headache, body aches, nonproductive cough and fever.  Patient reported that for the past 2 days he has progressively feeling weak and increasing difficulty breathing with exertion.  He notes some discomfort in his chest and use his inhaler without any significant relief.  Patient denies chest pain, palpitations, dizziness, nausea and vomiting.  ED Course: Found to be influenza A+, blood pressure was down to 90/50, fever of 100.7 and tachypneic up to 25.  Normal lactic acid, chest x-ray with no acute abnormalities. Breathing on nasal cannula to 100%. [facility] asked to admit for further observation     Assessment & Plan:   Active Problems:   Influenza A   Influenza A leading to COPD exacerbation (end stage COPD) - oseltamivir, prescription for prophylaxis called in for his wife from the ER -Was given Rocephin and Azithromycin in the ED, will d/c as etiology is viral and no PNA on x ray -Continue O2 supplementation-- wears chronically at home - increase Prednisone -- wean to home regimen as tolerated (patient refused over 20 mg PO) -pulm consult per patient request and feelings of not improving.... Discussed with the state of his lung, would be weeks before he felt better  Dizziness -orthostatics negative -discussed with pharmacy- doubt tamiflu causing -suspect related to xanax (pateint was instructed by Pulm to increase xanax but did not, here we have been giving him the higher dose) -PT eval- home health  COPD oxygen dependent (end stage per pulm)/Acute on  Chronic Hypoxic Respiratory Failure See Above   Hypotension in setting of viral illness and dehydration  Improved with IVF -resume home meds  Hx of HTN  -resume home meds   CAD s/p CABG  No chest pain -follow with Dr. Wynonia Lawman   BPH - stable  Continue Finasteride      DVT prophylaxis:  Lovenox   Code Status: Full Code   Family Communication: Wife at bedside  Disposition Plan:       Subjective: C/o "foggy head"  Able to lift knee to chest while standing  Objective: Vitals:   02/06/17 2220 02/07/17 0546 02/07/17 0936 02/07/17 1219  BP:  (!) 106/57 126/75 137/75  Pulse: 90 64 80   Resp: 20 20 18    Temp:  97.6 F (36.4 C) 97.9 F (36.6 C)   TempSrc:  Oral Oral   SpO2: 99% 97% 98% 100%  Weight:      Height:        Intake/Output Summary (Last 24 hours) at 02/07/2017 1350 Last data filed at 02/07/2017 1020 Gross per 24 hour  Intake 600 ml  Output 625 ml  Net -25 ml   Filed Weights   02/04/17 2012  Weight: 57.4 kg (126 lb 8.7 oz)    Examination:  General exam: up walking in room-- on 2L (same as home) Respiratory system: diminished but no inspiratory wheezing Cardiovascular system: rrr, LE edema Gastrointestinal system: +BS, soft Central nervous system: hard of hearing      Data Reviewed: I have personally reviewed following labs and imaging studies  CBC: Recent Labs  Lab 02/04/17 1429  02/04/17 1513 02/05/17 0732  WBC 9.9 10.1 5.3  NEUTROABS  --  8.6*  --   HGB 13.2 13.6 11.7*  HCT 39.9 41.2 35.6*  MCV 98.5 98.3 97.5  PLT 160 158 737   Basic Metabolic Panel: Recent Labs  Lab 02/04/17 1429 02/05/17 0732  NA 137 137  K 4.1 4.1  CL 98* 102  CO2 28 26  GLUCOSE 102* 135*  BUN 15 16  CREATININE 1.11 0.89  CALCIUM 9.4 8.5*   GFR: Estimated Creatinine Clearance: 49.3 mL/min (by C-G formula based on SCr of 0.89 mg/dL). Liver Function Tests: Recent Labs  Lab 02/05/17 0732  AST 27  ALT 19  ALKPHOS 87  BILITOT 0.7  PROT  4.9*  ALBUMIN 3.0*   No results for input(s): LIPASE, AMYLASE in the last 168 hours. No results for input(s): AMMONIA in the last 168 hours. Coagulation Profile: No results for input(s): INR, PROTIME in the last 168 hours. Cardiac Enzymes: No results for input(s): CKTOTAL, CKMB, CKMBINDEX, TROPONINI in the last 168 hours. BNP (last 3 results) No results for input(s): PROBNP in the last 8760 hours. HbA1C: No results for input(s): HGBA1C in the last 72 hours. CBG: No results for input(s): GLUCAP in the last 168 hours. Lipid Profile: No results for input(s): CHOL, HDL, LDLCALC, TRIG, CHOLHDL, LDLDIRECT in the last 72 hours. Thyroid Function Tests: No results for input(s): TSH, T4TOTAL, FREET4, T3FREE, THYROIDAB in the last 72 hours. Anemia Panel: No results for input(s): VITAMINB12, FOLATE, FERRITIN, TIBC, IRON, RETICCTPCT in the last 72 hours. Urine analysis:    Component Value Date/Time   COLORURINE YELLOW 09/12/2015 2246   APPEARANCEUR CLEAR 09/12/2015 2246   LABSPEC 1.009 09/12/2015 2246   PHURINE 7.0 09/12/2015 2246   GLUCOSEU NEGATIVE 09/12/2015 2246   HGBUR NEGATIVE 09/12/2015 2246   [place] NEGATIVE 09/12/2015 2246   KETONESUR NEGATIVE 09/12/2015 2246   PROTEINUR NEGATIVE 09/12/2015 2246   NITRITE NEGATIVE 09/12/2015 2246   LEUKOCYTESUR NEGATIVE 09/12/2015 2246     ) Recent Results (from the past 240 hour(s))  Blood Culture (routine x 2)     Status: None (Preliminary result)   Collection Time: 02/04/17  3:22 PM  Result Value Ref Range Status   Specimen Description BLOOD RIGHT FOREARM  Final   Special Requests   Final    BOTTLES DRAWN AEROBIC AND ANAEROBIC Blood Culture adequate volume   Culture NO GROWTH 2 DAYS  Final   Report Status PENDING  Incomplete  Blood Culture (routine x 2)     Status: None (Preliminary result)   Collection Time: 02/04/17  3:31 PM  Result Value Ref Range Status   Specimen Description BLOOD LEFT WRIST  Final   Special Requests    Final    BOTTLES DRAWN AEROBIC AND ANAEROBIC Blood Culture adequate volume   Culture NO GROWTH 2 DAYS  Final   Report Status PENDING  Incomplete      Anti-infectives (From admission, onward)   Start     Dose/Rate Route Frequency Ordered Stop   02/07/17 1400  azithromycin (ZITHROMAX) tablet 500 mg     500 mg Oral Daily 02/07/17 1349 02/12/17 0959   02/05/17 1100  oseltamivir (TAMIFLU) capsule 30 mg     30 mg Oral 2 times daily 02/05/17 1059 03/11/20 0959   02/04/17 1745  oseltamivir (TAMIFLU) capsule 75 mg     75 mg Oral  Once 02/04/17 1737 02/04/17 1832   02/04/17 1530  cefTRIAXone (ROCEPHIN) 1 g in dextrose 5 %  50 mL IVPB     1 g 100 mL/hr over 30 Minutes Intravenous  Once 02/04/17 1516 02/04/17 1659   02/04/17 1530  azithromycin (ZITHROMAX) 500 mg in dextrose 5 % 250 mL IVPB     500 mg 250 mL/hr over 60 Minutes Intravenous  Once 02/04/17 1516 02/04/17 1703       Radiology Studies: Dg Chest 2 View  Result Date: 02/06/2017 CLINICAL DATA:  Cough. Diagnosed with flu on Saturday 02/03/2017. Fever back to normal on Sunday. Former smoker. EXAM: CHEST  2 VIEW COMPARISON:  02/04/2017 FINDINGS: Postoperative changes in the mediastinum. Normal heart size and pulmonary vascularity. Prominent emphysematous changes in the lungs. Peribronchial thickening with central interstitial changes consistent with chronic bronchitis. No airspace disease or consolidation in the lungs. No blunting of costophrenic angles. No pneumothorax. Mediastinal contours appear intact. Calcification of the aorta. Degenerative changes in the spine. IMPRESSION: Prominent emphysematous changes and chronic bronchitic changes in the lungs. No evidence of active pulmonary disease. Electronically Signed   By: Lucienne Capers M.D.   On: 02/06/2017 23:00        Scheduled Meds: . ALPRAZolam  0.125 mg Oral TID  . aspirin  81 mg Oral Daily  . atorvastatin  20 mg Oral Daily  . azithromycin  500 mg Oral Daily  . B-complex with  vitamin C  1 tablet Oral Daily  . cholecalciferol  2,000 Units Oral Daily  . enoxaparin (LOVENOX) injection  40 mg Subcutaneous Q24H  . famotidine  20 mg Oral BID  . finasteride  5 mg Oral q morning - 10a  . fluticasone  2 spray Each Nare BID  . mouth rinse  15 mL Mouth Rinse q12n4p  . metoprolol succinate  25 mg Oral Daily  . multivitamin  1 tablet Oral Daily  . oseltamivir  30 mg Oral BID  . predniSONE  20 mg Oral Q breakfast  . sodium chloride flush  3 mL Intravenous Q12H  . tiotropium  18 mcg Inhalation Daily   Continuous Infusions:   LOS: 2 days    Time spent: 25 min    Geradine Girt, DO [facility] Pager [phone]  If 7PM-7AM, please contact night-coverage [email or web address] Password [email or web address] 02/07/2017, 1:50 PM

## 2017-02-07 NOTE — Telephone Encounter (Signed)
They are fine in combination with each other and his other meds

## 2017-02-07 NOTE — Telephone Encounter (Signed)
Pt is still in the hospital and is concerned about the medication they are trying to give him. Would like to speak to a nurse about this. Cb is [phone].

## 2017-02-07 NOTE — Consult Note (Signed)
Name: [name] MRN: [id number] DOB: [birth year]    ADMISSION DATE:  02/04/2017 CONSULTATION DATE:  02/07/17  REFERRING MD :  Dr. Eliseo Squires / [facility]   CHIEF COMPLAINT:  Weakness, SOB   HISTORY OF PRESENT ILLNESS:  84 y/o M with CAD s/p CABG, chronic hypercapnic / hypoxic respiratory failure (baseline pCO2 48, last sleep study 03/2005 with AHI <1), COPD on 2L O2 and 10 mg prednisone at baseline who presented to [facility] on 1/27 with reports of malaise, weakness & SOB.  He reports at baseline, he normally can walk around without difficulty despite "advanced COPD".  He lives at home with his wife independently.  Work up was notable for influenza A+ and mild hypotension.  He was treated with tamiflu, rocephin and azithromycin.   Prednisone was increased to 40 mg but blood pressure became elevated and this was reduced.  Initially, felt as though he was getting better but got up to walk with PT 1/30 and felt his dyspnea was increased from normal.  He continues to cough up thick yellow sputum.    PCCM consulted for evaluation of shortness of breath.   Labs 1/28 - WBC 5.3, Hgb 11.7, platelets 153, Na 137, K 4.1, CO2 26, BUN 16, Sr Cr 0.89.  CXR 1/29 showed emphysematous changes without evidence of infiltrate / edema.    PAST MEDICAL HISTORY :   has a past medical history of Asthma, BPH (benign prostatic hyperplasia), CAD (coronary artery disease), Carotid artery occlusion, Chronic rhinitis, COPD (chronic obstructive pulmonary disease) ([place]), Hyperlipidemia, Hypertension, Peripheral vascular disease ([place]), S/P CABG x 4 (10/2001), Syncope and collapse, and Ureteral tumor (08/2015).   has a past surgical history that includes Coronary artery bypass graft (2003); Cataract extraction (11-20-11); Lithotripsy; and Prostate biopsy (Oct. 2014).  Prior to Admission medications   Medication Sig Start Date End Date Taking? Authorizing Provider  ALPRAZolam (XANAX) 0.25 MG tablet Take 1 tablet (0.25 mg total) by mouth 3  (three) times daily. Takes 1/2 tablet two times daily. 01/11/17  Yes Tanda Rockers, MD  aspirin 81 MG tablet Take 81 mg by mouth daily.   Yes [provider]  atorvastatin (LIPITOR) 20 MG tablet TAKE 1 TABLET BY MOUTH EVERY DAY 05/24/16  Yes Belva Crome, MD  b complex vitamins tablet Take 1 tablet by mouth daily.   Yes [provider]  Cholecalciferol (VITAMIN D3) 2000 units TABS Take 2,000 Units by mouth daily.   Yes [provider]  Cyanocobalamin (VITAMIN B 12 PO) Take 1 tablet daily by mouth.    Yes [provider]  finasteride (PROSCAR) 5 MG tablet Take 5 mg by mouth every morning.   Yes [provider]  guaiFENesin (MUCINEX) 600 MG 12 hr tablet Take by mouth 2 (two) times daily as needed.   Yes [provider]  mometasone (NASONEX) 50 MCG/ACT nasal spray SHAKE LIQUID AND USE 2 SPRAYS IN EACH NOSTRIL DAILY 12/25/16  Yes Tanda Rockers, MD  Multiple Vitamins-Minerals (PRESERVISION AREDS 2) CAPS Take 1 capsule by mouth daily.   Yes [provider]  NITROSTAT 0.4 MG SL tablet DISSOLVE 1 TABLET UNDER THE TONGUE AS DIRECTED AND AS NEEDED FOR CHEST PAIN 05/13/15  Yes Belva Crome, MD  OXYGEN Inhale 2 L continuous into the lungs.    Yes [provider]  predniSONE (DELTASONE) 10 MG tablet Take 10 mg by mouth daily with breakfast. If having flare of cough/wheezing: May take 2 daily until better, then 1  daily   Yes [provider]  ranitidine (ZANTAC) 150 MG tablet Take 150 mg by mouth 2 (two) times daily.   Yes [provider]  Sodium Chloride, Hypertonic, (MURO 128 OP) Place 1 drop 4 (four) times daily into both eyes.   Yes [provider]  SPIRIVA RESPIMAT 2.5 MCG/ACT AERS INHALE 2 PUFFS BY MOUTH EVERY DAY 10/05/16  Yes Tanda Rockers, MD  SYMBICORT 160-4.5 MCG/ACT inhaler INHALE 2 PUFFS BY MOUTH EVERY MORNING AND THEN ANOTHER 2 PUFFS ABOUT 12 HOURS LATER 11/15/16  Yes Tanda Rockers, MD  TOPROL XL  25 MG 24 hr tablet TAKE 1 TABLET BY MOUTH DAILY 04/25/16  Yes Belva Crome, MD  XOPENEX HFA 45 MCG/ACT inhaler INHALE 2 PUFFS BY MOUTH EVERY 4 HOURS AS NEEDED FOR WHEEZING OR SHORTNESS OF BREATH 10/05/16  Yes Tanda Rockers, MD  azithromycin (ZITHROMAX) 250 MG tablet Take 2 pills today then one a day for 4 additional days 12/06/16   Tanda Rockers, MD    Allergies  Allergen Reactions  . Sulfonamide Derivatives Swelling    REACTION: facial/tongue swelling  . Cefdinir Diarrhea    REACTION: diarrhea  . Ciprofloxacin Itching and Rash    Red itchy hands  . Nitrofurantoin Swelling    Swollen hands  . Levaquin [Levofloxacin] Itching and Rash    FAMILY HISTORY:  family history includes Allergies in his brother; Breast cancer in his mother; Cancer in his mother; Heart attack in his father; Heart disease in his brother, brother, father, and mother; Hyperlipidemia in his brother; Hypertension in his brother and mother; Other in his mother; Peripheral vascular disease in his brother; Stroke in his brother.  SOCIAL HISTORY:  reports that he quit smoking about 35 years ago. His smoking use included cigarettes. He has a 66.00 pack-year smoking history. he has never used smokeless tobacco. He reports that he drinks alcohol. He reports that he does not use drugs.  REVIEW OF SYSTEMS:  POSITIVES IN BOLD Constitutional: Negative for fever, chills, weight loss, malaise/fatigue and diaphoresis.  HENT: Negative for hearing loss, ear pain, nosebleeds, congestion, sore throat, neck pain, tinnitus and ear discharge.   Eyes: Negative for blurred vision, double vision, photophobia, pain, discharge and redness.  Respiratory: Negative for cough, hemoptysis, sputum production, shortness of breath, wheezing and stridor.   Cardiovascular: Negative for chest pain, palpitations, orthopnea, claudication, leg swelling and PND.  Gastrointestinal: Negative for heartburn, nausea, vomiting, abdominal pain, diarrhea,  constipation, blood in stool and melena.  Genitourinary: Negative for dysuria, urgency, frequency, hematuria and flank pain.  Musculoskeletal: Negative for myalgias, back pain, joint pain and falls.  Skin: Negative for itching and rash.  Neurological: Negative for dizziness, tingling, tremors, sensory change, speech change, focal weakness, seizures, loss of consciousness, weakness and headaches.  Endo/Heme/Allergies: Negative for environmental allergies and polydipsia. Does not bruise/bleed easily.  SUBJECTIVE:   VITAL SIGNS: Temp:  [97.6 F (36.4 C)-98.2 F (36.8 C)] 97.9 F (36.6 C) (01/30 0936) Pulse Rate:  [64-91] 80 (01/30 0936) Resp:  [18-22] 18 (01/30 0936) BP: (106-170)/(57-88) 137/75 (01/30 1219) SpO2:  [97 %-100 %] 100 % (01/30 1219)  PHYSICAL EXAMINATION: General: elderly male in NAD, sitting up at bedside  HEENT: MM pink/moist PSY: calm /appropriate  Neuro: AAOx4, speech clear, MAE CV: s1s2 rrr, no m/r/g PULM: even/non-labored, lungs bilaterally with end expiratory wheezing  DG:LOVF, non-tender, bsx4 active  Extremities: warm/dry, 1+ BLE edema up to knees  Skin: no rashes or lesions   Recent  Labs  Lab 02/04/17 1429 02/05/17 0732  NA 137 137  K 4.1 4.1  CL 98* 102  CO2 28 26  BUN 15 16  CREATININE 1.11 0.89  GLUCOSE 102* 135*    Recent Labs  Lab 02/04/17 1429 02/04/17 1513 02/05/17 0732  HGB 13.2 13.6 11.7*  HCT 39.9 41.2 35.6*  WBC 9.9 10.1 5.3  PLT 160 158 153    Dg Chest 2 View  Result Date: 02/06/2017 CLINICAL DATA:  Cough. Diagnosed with flu on Saturday 02/03/2017. Fever back to normal on Sunday. Former smoker. EXAM: CHEST  2 VIEW COMPARISON:  02/04/2017 FINDINGS: Postoperative changes in the mediastinum. Normal heart size and pulmonary vascularity. Prominent emphysematous changes in the lungs. Peribronchial thickening with central interstitial changes consistent with chronic bronchitis. No airspace disease or consolidation in the lungs. No  blunting of costophrenic angles. No pneumothorax. Mediastinal contours appear intact. Calcification of the aorta. Degenerative changes in the spine. IMPRESSION: Prominent emphysematous changes and chronic bronchitic changes in the lungs. No evidence of active pulmonary disease. Electronically Signed   By: Lucienne Capers M.D.   On: 02/06/2017 23:00      SIGNIFICANT EVENTS  1/27  Admit with Flu A+ 1/30  PCCM consulted for evaluation of dyspnea   STUDIES:    ASSESSMENT / PLAN:  Discussion:  84 y/o M admitted with dyspnea, malaise in the setting of influenza A+.  Advanced COPD on home O2, prednisone and Trilogy for hypercapnia.  Expect he will be slow to resolve given his advanced lung disease.  Concern will be monitoring for further decompensation / development of post-viral bacterial infection.    Influenza A Positive Acute on Chronic Hypoxemic / Hypercapnic Respiratory Failure  End-Stage COPD - on 2L O2, trilogy, 10 mg prednisone at baseline   Plan: Continue tamiflu  Cover with rocephin while inpatient and transition to doxycycline at home.  Plan for 7 days total abx for empiric coverage of post viral bacterial infection  Pulmonanry hygiene - IS, mobilize Follow intermittent CXR  Follow up with Pulmonary arranged > see discharge section  Continue prednisone with slow taper back to baseline 10 mg QD One time dose solumedrol now given bronchospasm  Continue PT efforts > likely will need at home as well Home Trilogy QHS & PRN daytime sleep  Return to baseline home pulmonary medications at discharge   Noe Gens, NP-C [place] Pgr: [phone] or if no answer [phone] 02/07/2017, 1:36 PM

## 2017-02-08 ENCOUNTER — Inpatient Hospital Stay (HOSPITAL_COMMUNITY): Payer: Medicare Other

## 2017-02-08 ENCOUNTER — Encounter (HOSPITAL_COMMUNITY): Payer: Self-pay | Admitting: General Practice

## 2017-02-08 DIAGNOSIS — R600 Localized edema: Secondary | ICD-10-CM

## 2017-02-08 MED ORDER — OXYMETAZOLINE HCL 0.05 % NA SOLN: 1.0000 | Freq: Two times a day (BID) | NASAL | Status: DC

## 2017-02-08 MED ORDER — PREDNISONE 20 MG PO TABS
40.0000 mg | ORAL_TABLET | Freq: Every day | ORAL | Status: DC
  Administered 2017-02-09: 40 mg via ORAL
  Filled 2017-02-08: qty 2

## 2017-02-08 MED ORDER — OXYMETAZOLINE HCL 0.05 % NA SOLN
1.0000 | Freq: Two times a day (BID) | NASAL | Status: DC
  Administered 2017-02-08 – 2017-02-09 (×2): 1 via NASAL
  Filled 2017-02-08 (×2): qty 15

## 2017-02-08 NOTE — Progress Notes (Signed)
Physical Therapy Treatment Patient Details Name: [name] MRN: [id number] DOB: November 30, 1929 Today's Date: 02/08/2017    History of Present Illness 84 y.o. male admitted on 02/04/17 for weakness and SOB found to have the flu.  Pt with significant PMH of syncope, CABG, PVD, HTN, COPD, carotid artery occlusion, CAD, asthma, and chronic rhinitis.    PT Comments    Pt able to ambulate further in the hallway today compared to PT session yesterday.  He was discouraged, because he reports he did better this morning walking without stopping.  Pt remains appropriate for home therapy at discharge.    Follow Up Recommendations  Home health PT;Supervision for mobility/OOB     Equipment Recommendations  None recommended by PT    Recommendations for Other Services   NA     Precautions / Restrictions Precautions Precautions: Other (comment) Precaution Comments: monitor O2 sats and DOE with gait Restrictions Weight Bearing Restrictions: No    Mobility  Bed Mobility Overal bed mobility: Modified Independent                Transfers Overall transfer level: Needs assistance Equipment used: None Transfers: Sit to/from Stand Sit to Stand: Supervision         General transfer comment: supervision for safety  Ambulation/Gait Ambulation/Gait assistance: Supervision Ambulation Distance (Feet): 115 Feet(with one seated rest break at 50') Assistive device: Rolling walker (2 wheeled) Gait Pattern/deviations: Step-through pattern;Staggering left;Staggering right Gait velocity: decreased Gait velocity interpretation: Below normal speed for age/gender General Gait Details: mildly staggering gait pattern, but able to catch himself.  He reports he walked this AM and did better then, not having to stop and rest.           Balance Overall balance assessment: Needs assistance Sitting-balance support: Feet supported;No upper extremity supported Sitting balance-Leahy Scale: Good      Standing balance support: No upper extremity supported Standing balance-Leahy Scale: Good                              Cognition Arousal/Alertness: Awake/alert Behavior During Therapy: WFL for tasks assessed/performed Overall Cognitive Status: Within Functional Limits for tasks assessed                                 General Comments: Very HOH      Exercises Other Exercises Other Exercises: flutter valve x 5    General Comments General comments (skin integrity, edema, etc.): Pt with 2-3/4 DOE during gait.  HR and O2 sats stable on 2 L O2 [place] during gait.       Pertinent Vitals/Pain Pain Assessment: No/denies pain    Home Living Family/patient expects to be discharged to:: Private residence Living Arrangements: Spouse/significant other Available Help at Discharge: Family;Available 24 hours/day Type of Home: House Home Access: Stairs to enter   Home Layout: Multi-level Home Equipment: Other (comment)(home O2, 2L) Additional Comments: Pt reports he has a BP monitor and O2 sat reader at home.     Prior Function Level of Independence: Independent      Comments: Per pt report he walks on the treadmill daily for 5 mins at a time at 1.0 mph with a seated rest break every 5 mins until he works up to 15 mins.     PT Goals (current goals can now be found in the care plan section) Acute Rehab PT Goals Patient  Stated Goal: get back to baseline Progress towards PT goals: Progressing toward goals    Frequency    Min 3X/week      PT Plan Current plan remains appropriate       AM-PAC PT "6 Clicks" Daily Activity  Outcome Measure  Difficulty turning over in bed (including adjusting bedclothes, sheets and blankets)?: None Difficulty moving from lying on back to sitting on the side of the bed? : None Difficulty sitting down on and standing up from a chair with arms (e.g., wheelchair, bedside commode, etc,.)?: None Help needed moving to and from a  bed to chair (including a wheelchair)?: None Help needed walking in hospital room?: None Help needed climbing 3-5 steps with a railing? : A Little 6 Click Score: 23    End of Session Equipment Utilized During Treatment: Oxygen(2 L O2 [place]) Activity Tolerance: Patient limited by fatigue Patient left: in bed;Other (comment)(seated EOB)   PT Visit Diagnosis: Unsteadiness on feet (R26.81);Muscle weakness (generalized) (M62.81)     Time: 3664-4034 PT Time Calculation (min) (ACUTE ONLY): 22 min  Charges:  $Gait Training: 8-22 mins          Azarion Hove B. [place], [place], DPT [phone]            02/08/2017, 5:28 PM

## 2017-02-08 NOTE — Progress Notes (Signed)
PROGRESS NOTE    [name]  BDZ:[id number] DOB: 07-04-1929 DOA: 02/04/2017 PCP: [name], MD   Outpatient Specialists:     Brief Narrative:  [name] is a 84 y.o. male with medical history significant of COPD oxygen dependent, CAD status post CABG, BPH, hypertension and GERD presented to the emergency department complaining of generalized weakness and increase in short of breath.  Associated symptoms include headache, body aches, nonproductive cough and fever.  Patient reported that for the past 2 days he has progressively feeling weak and increasing difficulty breathing with exertion.  He notes some discomfort in his chest and use his inhaler without any significant relief.  Patient denies chest pain, palpitations, dizziness, nausea and vomiting.  ED Course: Found to be influenza A+, blood pressure was down to 90/50, fever of 100.7 and tachypneic up to 25.  Normal lactic acid, chest x-ray with no acute abnormalities. Breathing on nasal cannula to 100%. [facility] asked to admit for further observation     Assessment & Plan:   Active Problems:   Influenza A   Influenza A leading to COPD exacerbation (end stage COPD) - oseltamivir x 5 days -Was given Rocephin and Azithromycin in the ED-- resume rocephin per pulm-- then d/c on doxy -Continue O2 supplementation-- wears chronically at home - increase Prednisone- per pulm-- patient had initially refused but now I think is agreeable -pulm consult apprecaited  Dizziness Seems resolved -discussed with pharmacy- doubt tamiflu causing -suspect related to xanax (pateint was instructed by Pulm to increase xanax but did not, here we have been giving him the higher dose) -PT eval- home health  COPD oxygen dependent (end stage per pulm)/Acute on Chronic Hypoxic Respiratory Failure See Above   Hypotension in setting of viral illness and dehydration  Improved with IVF -resume home meds  Hx of HTN  -resume home meds   CAD  s/p CABG  No chest pain -follow with Dr. Wynonia Lawman   BPH - stable  Continue Finasteride   LE edema -elevate legs -TED hose    DVT prophylaxis:  Lovenox   Code Status: Full Code   Family Communication: Wife at bedside  Disposition Plan:       Subjective: Now he thinks the abx are making him "foggy"  Objective: Vitals:   02/07/17 1219 02/07/17 2056 02/08/17 0500 02/08/17 0928  BP: 137/75 132/89 128/65   Pulse:  90 78   Resp:  19 16   Temp:  98.4 F (36.9 C) 98.1 F (36.7 C)   TempSrc:  Oral    SpO2: 100% 96% 95% 98%  Weight:      Height:        Intake/Output Summary (Last 24 hours) at 02/08/2017 1249 Last data filed at 02/08/2017 3419 Gross per 24 hour  Intake 463 ml  Output 200 ml  Net 263 ml   Filed Weights   02/04/17 2012  Weight: 57.4 kg (126 lb 8.7 oz)    Examination:  General exam: on 2L at 98% walking in room-- appears anxious Respiratory system: no wheezing, diminished but air moving Cardiovascular system: +LE edema Central nervous system: hard of hearing      Data Reviewed: I have personally reviewed following labs and imaging studies  CBC: Recent Labs  Lab 02/04/17 1429 02/04/17 1513 02/05/17 0732  WBC 9.9 10.1 5.3  NEUTROABS  --  8.6*  --   HGB 13.2 13.6 11.7*  HCT 39.9 41.2 35.6*  MCV 98.5 98.3 97.5  PLT 160  158 182   Basic Metabolic Panel: Recent Labs  Lab 02/04/17 1429 02/05/17 0732  NA 137 137  K 4.1 4.1  CL 98* 102  CO2 28 26  GLUCOSE 102* 135*  BUN 15 16  CREATININE 1.11 0.89  CALCIUM 9.4 8.5*   GFR: Estimated Creatinine Clearance: 49.3 mL/min (by C-G formula based on SCr of 0.89 mg/dL). Liver Function Tests: Recent Labs  Lab 02/05/17 0732  AST 27  ALT 19  ALKPHOS 87  BILITOT 0.7  PROT 4.9*  ALBUMIN 3.0*   No results for input(s): LIPASE, AMYLASE in the last 168 hours. No results for input(s): AMMONIA in the last 168 hours. Coagulation Profile: No results for input(s): INR, PROTIME in the  last 168 hours. Cardiac Enzymes: No results for input(s): CKTOTAL, CKMB, CKMBINDEX, TROPONINI in the last 168 hours. BNP (last 3 results) No results for input(s): PROBNP in the last 8760 hours. HbA1C: No results for input(s): HGBA1C in the last 72 hours. CBG: No results for input(s): GLUCAP in the last 168 hours. Lipid Profile: No results for input(s): CHOL, HDL, LDLCALC, TRIG, CHOLHDL, LDLDIRECT in the last 72 hours. Thyroid Function Tests: No results for input(s): TSH, T4TOTAL, FREET4, T3FREE, THYROIDAB in the last 72 hours. Anemia Panel: No results for input(s): VITAMINB12, FOLATE, FERRITIN, TIBC, IRON, RETICCTPCT in the last 72 hours. Urine analysis:    Component Value Date/Time   COLORURINE YELLOW 09/12/2015 2246   APPEARANCEUR CLEAR 09/12/2015 2246   LABSPEC 1.009 09/12/2015 2246   PHURINE 7.0 09/12/2015 2246   GLUCOSEU NEGATIVE 09/12/2015 2246   HGBUR NEGATIVE 09/12/2015 2246   BILIRUBINUR NEGATIVE 09/12/2015 2246   KETONESUR NEGATIVE 09/12/2015 2246   PROTEINUR NEGATIVE 09/12/2015 2246   NITRITE NEGATIVE 09/12/2015 2246   LEUKOCYTESUR NEGATIVE 09/12/2015 2246     ) Recent Results (from the past 240 hour(s))  Blood Culture (routine x 2)     Status: None (Preliminary result)   Collection Time: 02/04/17  3:22 PM  Result Value Ref Range Status   Specimen Description BLOOD RIGHT FOREARM  Final   Special Requests   Final    BOTTLES DRAWN AEROBIC AND ANAEROBIC Blood Culture adequate volume   Culture NO GROWTH 4 DAYS  Final   Report Status PENDING  Incomplete  Blood Culture (routine x 2)     Status: None (Preliminary result)   Collection Time: 02/04/17  3:31 PM  Result Value Ref Range Status   Specimen Description BLOOD LEFT WRIST  Final   Special Requests   Final    BOTTLES DRAWN AEROBIC AND ANAEROBIC Blood Culture adequate volume   Culture NO GROWTH 4 DAYS  Final   Report Status PENDING  Incomplete      Anti-infectives (From admission, onward)   Start      Dose/Rate Route Frequency Ordered Stop   02/07/17 1500  cefTRIAXone (ROCEPHIN) 1 g in dextrose 5 % 50 mL IVPB     1 g 100 mL/hr over 30 Minutes Intravenous Every 24 hours 02/07/17 1351     02/07/17 1400  azithromycin (ZITHROMAX) tablet 500 mg  Status:  Discontinued     500 mg Oral Daily 02/07/17 1349 02/07/17 1351   02/05/17 1100  oseltamivir (TAMIFLU) capsule 30 mg     30 mg Oral 2 times daily 02/05/17 1059 02/08/17 2359   02/04/17 1745  oseltamivir (TAMIFLU) capsule 75 mg     75 mg Oral  Once 02/04/17 1737 02/04/17 1832   02/04/17 1530  cefTRIAXone (ROCEPHIN) 1 g in  dextrose 5 % 50 mL IVPB     1 g 100 mL/hr over 30 Minutes Intravenous  Once 02/04/17 1516 02/04/17 1659   02/04/17 1530  azithromycin (ZITHROMAX) 500 mg in dextrose 5 % 250 mL IVPB     500 mg 250 mL/hr over 60 Minutes Intravenous  Once 02/04/17 1516 02/04/17 1703       Radiology Studies: Dg Chest 2 View  Result Date: 02/08/2017 CLINICAL DATA:  Influenza.  Productive cough.  Ex-smoker. EXAM: CHEST  2 VIEW COMPARISON:  02/06/2017 FINDINGS: Hyperinflation. Mild pectus excavatum deformity. Midline trachea. Normal heart size. Atherosclerosis in the transverse aorta. Prior median sternotomy. Left costophrenic angle blunting on the frontal is chronic and likely due to pleural thickening. There is also mild left apical pleural thickening. No lobar consolidation. IMPRESSION: Hyperinflation, without acute disease. Aortic Atherosclerosis (ICD10-I70.0). Electronically Signed   By: Abigail Miyamoto M.D.   On: 02/08/2017 09:06   Dg Chest 2 View  Result Date: 02/06/2017 CLINICAL DATA:  Cough. Diagnosed with flu on Saturday 02/03/2017. Fever back to normal on Sunday. Former smoker. EXAM: CHEST  2 VIEW COMPARISON:  02/04/2017 FINDINGS: Postoperative changes in the mediastinum. Normal heart size and pulmonary vascularity. Prominent emphysematous changes in the lungs. Peribronchial thickening with central interstitial changes consistent with  chronic bronchitis. No airspace disease or consolidation in the lungs. No blunting of costophrenic angles. No pneumothorax. Mediastinal contours appear intact. Calcification of the aorta. Degenerative changes in the spine. IMPRESSION: Prominent emphysematous changes and chronic bronchitic changes in the lungs. No evidence of active pulmonary disease. Electronically Signed   By: Lucienne Capers M.D.   On: 02/06/2017 23:00        Scheduled Meds: . ALPRAZolam  0.125 mg Oral TID  . aspirin  81 mg Oral Daily  . atorvastatin  20 mg Oral Daily  . B-complex with vitamin C  1 tablet Oral Daily  . cholecalciferol  2,000 Units Oral Daily  . enoxaparin (LOVENOX) injection  40 mg Subcutaneous Q24H  . famotidine  20 mg Oral BID  . finasteride  5 mg Oral q morning - 10a  . fluticasone  2 spray Each Nare BID  . mouth rinse  15 mL Mouth Rinse q12n4p  . metoprolol succinate  25 mg Oral Daily  . multivitamin  1 tablet Oral Daily  . oseltamivir  30 mg Oral BID  . [START ON 02/09/2017] predniSONE  40 mg Oral Q breakfast  . sodium chloride flush  3 mL Intravenous Q12H  . tiotropium  18 mcg Inhalation Daily   Continuous Infusions: . cefTRIAXone (ROCEPHIN)  IV Stopped (02/07/17 1512)     LOS: 3 days    Time spent: 25 min    Geradine Girt, DO [facility] Pager [phone]  If 7PM-7AM, please contact night-coverage [email or web address] Password [facility] 02/08/2017, 12:49 PM

## 2017-02-08 NOTE — Telephone Encounter (Signed)
Called and spoke with Tye [place] at [employer] - she supplies the vent and supplies and was instructed vent needs to be picked up as pt is not in need of it at this time She advised that she is needing last OV from [place] stating if pt is to con't vent or not And needs a letter stating pt is needing to remain on the vent treatment if needing to con't Per MW note on 01/11/17, pt needs to remain off the noc vent  Pt is currently in hospital, was advised by RA to remain on noc vent Called Cathy back to advise not pick up the noc vent until pt is seen in office on 02/14/2017 for hospital f/u Pt is scheduled with [facility] for hospital f/u on 02/14/2017  Need to know from this appt on 02/14/2017 if pt needs to remain on vent or not  Routing message to [place] for review

## 2017-02-08 NOTE — Telephone Encounter (Signed)
This will have to be determined at the [facility]. Dr. [name], any suggestions? Thanks

## 2017-02-08 NOTE — Telephone Encounter (Signed)
[facility] returning call. Cb is [phone]

## 2017-02-08 NOTE — Evaluation (Signed)
Occupational Therapy Evaluation Patient Details Name: [name] MRN: [id number] DOB: 10-09-1929 Today's Date: 02/08/2017    History of Present Illness 84 y.o. male admitted on 02/04/17 for weakness and SOB found to have the flu.  Pt with significant PMH of syncope, CABG, PVD, HTN, COPD, carotid artery occlusion, CAD, asthma, and chronic rhinitis.   Clinical Impression   Pt with decreased strength and endurance (generalized) for ADLs, ADL mobility and functional tasks. Pt moving well, however fatigues easily maintaining O2 SATs 97-100%. Pt educated on energy conservation techniques and provided handout    Follow Up Recommendations  No OT follow up;Supervision - Intermittent    Equipment Recommendations  None recommended by OT    Recommendations for Other Services       Precautions / Restrictions Precautions Precaution Comments: monitor O2 sats and DOE with gait Restrictions Weight Bearing Restrictions: No      Mobility Bed Mobility Overal bed mobility: Modified Independent                Transfers Overall transfer level: Needs assistance Equipment used: None Transfers: Sit to/from Stand Sit to Stand: Supervision         General transfer comment: sup for safety, O2 SATs remained 97-100% throughout on 2L O2    Balance Overall balance assessment: Needs assistance Sitting-balance support: Feet supported;No upper extremity supported Sitting balance-Leahy Scale: Good     Standing balance support: No upper extremity supported;During functional activity Standing balance-Leahy Scale: Good                             ADL either performed or assessed with clinical judgement   ADL Overall ADL's : Needs assistance/impaired Eating/Feeding: Sitting;Independent   Grooming: Standing;Supervision/safety;Set up;Wash/dry face;Wash/dry hands;Brushing hair   Upper Body Bathing: Set up;Supervision/ safety   Lower Body Bathing: Set up;Supervison/ safety    Upper Body Dressing : Set up;Supervision/safety   Lower Body Dressing: Set up;Supervision/safety   Toilet Transfer: Supervision/safety;Ambulation;Regular Museum/gallery exhibitions officer and Hygiene: Supervision/safety   Tub/ Banker: Supervision/safety;3 in 1   Functional mobility during ADLs: Supervision/safety       Vision Baseline Vision/History: Wears glasses Patient Visual Report: No change from baseline       Perception     Praxis      Pertinent Vitals/Pain Pain Assessment: No/denies pain     Hand Dominance Right   Extremity/Trunk Assessment Upper Extremity Assessment Upper Extremity Assessment: Generalized weakness   Lower Extremity Assessment Lower Extremity Assessment: Defer to PT evaluation   Cervical / Trunk Assessment Cervical / Trunk Assessment: Kyphotic   Communication Communication Communication: HOH(has hearing aid)   Cognition Arousal/Alertness: Awake/alert Behavior During Therapy: WFL for tasks assessed/performed Overall Cognitive Status: Within Functional Limits for tasks assessed                                     General Comments       Exercises     Shoulder Instructions      Home Living Family/patient expects to be discharged to:: Private residence Living Arrangements: Spouse/significant other Available Help at Discharge: Family;Available 24 hours/day Type of Home: House Home Access: Stairs to enter [employer] of Steps: 4   Home Layout: Multi-level Alternate Level Stairs-Number of Steps: 7 Alternate Level Stairs-Rails: Right Bathroom Shower/Tub: Teacher, early years/pre: Standard  Home Equipment: Other (comment)(home O2, 2L)   Additional Comments: Pt reports he has a BP monitor and O2 sat reader at home.       Prior Functioning/Environment Level of Independence: Independent        Comments: Per pt report he walks on the treadmill daily for 5 mins at a time  at 1.0 mph with a seated rest break every 5 mins until he works up to 15 mins.          OT Problem List: Decreased activity tolerance;Decreased strength;Decreased knowledge of use of DME or AE      OT Treatment/Interventions: Self-care/ADL training;DME and/or AE instruction;Therapeutic activities;Energy conservation;Patient/family education    OT Goals(Current goals can be found in the care plan section) Acute Rehab OT Goals Patient Stated Goal: get back to baseline OT Goal Formulation: With patient Time For Goal Achievement: 02/22/17 Potential to Achieve Goals: Good ADL Goals Pt Will Perform Grooming: with modified independence Pt Will Perform Lower Body Bathing: with modified independence Pt Will Perform Lower Body Dressing: with modified independence Pt Will Transfer to Toilet: ambulating;regular height toilet;grab bars Pt Will Perform Toileting - Clothing Manipulation and hygiene: with modified independence Pt Will Perform Tub/Shower Transfer: ambulating;shower seat;3 in 1;grab bars Additional ADL Goal #1: Pt will verbalize 3 energy conservation techniques for ADLs and ADL mobility  OT Frequency: Min 2X/week   Barriers to D/C:    no barriers       Co-evaluation              AM-PAC PT "6 Clicks" Daily Activity     Outcome Measure Help from another person eating meals?: None Help from another person taking care of personal grooming?: A Little Help from another person toileting, which includes using toliet, bedpan, or urinal?: A Little Help from another person bathing (including washing, rinsing, drying)?: A Little Help from another person to put on and taking off regular upper body clothing?: None Help from another person to put on and taking off regular lower body clothing?: A Little 6 Click Score: 20   End of Session Equipment Utilized During Treatment: Gait belt  Activity Tolerance: Patient tolerated treatment well Patient left: in bed(sitting EOB eating  lunch)  OT Visit Diagnosis: Muscle weakness (generalized) (M62.81)                Time: 1093-2355 OT Time Calculation (min): 26 min Charges:  OT General Charges $OT Visit: 1 Visit OT Evaluation $OT Eval Moderate Complexity: 1 Mod OT Treatments $Therapeutic Activity: 8-22 mins G-Codes: OT G-codes **NOT FOR INPATIENT CLASS** Functional Assessment Tool Used: AM-PAC 6 Clicks Daily Activity     Britt Bottom 02/08/2017, 2:21 PM

## 2017-02-08 NOTE — Progress Notes (Signed)
Pt wears home Trilogy.  Trilogy at bedside within patient reach no assistance needed.

## 2017-02-09 DIAGNOSIS — J9621 Acute and chronic respiratory failure with hypoxia: Secondary | ICD-10-CM

## 2017-02-09 DIAGNOSIS — J189 Pneumonia, unspecified organism: Secondary | ICD-10-CM

## 2017-02-09 DIAGNOSIS — J9611 Chronic respiratory failure with hypoxia: Secondary | ICD-10-CM

## 2017-02-09 DIAGNOSIS — J9612 Chronic respiratory failure with hypercapnia: Secondary | ICD-10-CM

## 2017-02-09 LAB — CULTURE, BLOOD (ROUTINE X 2)
Culture: NO GROWTH
Culture: NO GROWTH
Special Requests: ADEQUATE
Special Requests: ADEQUATE

## 2017-02-09 MED ORDER — DOXYCYCLINE HYCLATE 100 MG PO TABS: 100.0000 mg | ORAL_TABLET | Freq: Two times a day (BID) | ORAL | 0 refills | Status: DC

## 2017-02-09 MED ORDER — PREDNISONE 10 MG PO TABS: ORAL_TABLET | ORAL | 0 refills | Status: DC

## 2017-02-09 NOTE — Telephone Encounter (Signed)
Would leave on the trelegy for now based on recs of pulmonolgists most recent recs (Dr Elsworth Soho) and I will sort out on return

## 2017-02-09 NOTE — Progress Notes (Signed)
Name: [name] MRN: [id number] DOB: [birth year]    ADMISSION DATE:  02/04/2017 CONSULTATION DATE:  02/07/17  REFERRING MD :  Dr. Eliseo Squires / [facility]   CHIEF COMPLAINT:  Weakness, SOB   HISTORY OF PRESENT ILLNESS:  84 y/o M with CAD s/p CABG, chronic hypercapnic / hypoxic respiratory failure (baseline pCO2 48, last sleep study 03/2005 with AHI <1), COPD on 2L O2 and 10 mg prednisone at baseline who presented to [facility] on 1[facility] with reports of malaise, weakness & SOB.  He reports at baseline, he normally can walk around without difficulty despite "advanced COPD".  He lives at home with his wife independently.  Work up was notable for influenza A+ and mild hypotension.  He was treated with tamiflu, rocephin and azithromycin.   Prednisone was increased to 40 mg but blood pressure became elevated and this was reduced.  Initially, felt as though he was getting better but got up to walk with PT 1/30 and felt his dyspnea was increased from normal.  He continues to cough up thick yellow sputum.    PCCM consulted for evaluation of shortness of breath.   Labs 1/28 - WBC 5.3, Hgb 11.7, platelets 153, Na 137, K 4.1, CO2 26, BUN 16, Sr Cr 0.89.  CXR 1/29 showed emphysematous changes without evidence of infiltrate / edema.   SUBJECTIVE: Patient is very concerned about going home and PCCM was called to reassure patient  VITAL SIGNS: Temp:  [98 F (36.7 C)] 98 F (36.7 C) (02/01 0553) Pulse Rate:  [66-69] 66 (02/01 0553) Resp:  [17] 17 (02/01 0553) BP: (136)/(53-81) 136/53 (02/01 0553) SpO2:  [91 %-97 %] 97 % (02/01 1019)  PHYSICAL EXAMINATION: General: elderly male, nervous appearing HEENT: Patrick/AT, PERRL, EOM-I and MMM PSY: Anxious Neuro: Alert and oriented, moving all ext to command CV: RRR, Nl S1/S2 and -M/R/G. PULM: Decreased BS diffusely GI: Soft, NT, ND and +BS Extremities: -edema Skin: no rashes or lesions  Recent Labs  Lab 02/04/17 1429 02/05/17 0732  NA 137 137  K 4.1 4.1  CL 98* 102    CO2 28 26  BUN 15 16  CREATININE 1.11 0.89  GLUCOSE 102* 135*   Recent Labs  Lab 02/04/17 1429 02/04/17 1513 02/05/17 0732  HGB 13.2 13.6 11.7*  HCT 39.9 41.2 35.6*  WBC 9.9 10.1 5.3  PLT 160 158 153   Dg Chest 2 View  Result Date: 02/08/2017 CLINICAL DATA:  Influenza.  Productive cough.  Ex-smoker. EXAM: CHEST  2 VIEW COMPARISON:  02/06/2017 FINDINGS: Hyperinflation. Mild pectus excavatum deformity. Midline trachea. Normal heart size. Atherosclerosis in the transverse aorta. Prior median sternotomy. Left costophrenic angle blunting on the frontal is chronic and likely due to pleural thickening. There is also mild left apical pleural thickening. No lobar consolidation. IMPRESSION: Hyperinflation, without acute disease. Aortic Atherosclerosis (ICD10-I70.0). Electronically Signed   By: Abigail Miyamoto M.D.   On: 02/08/2017 09:06   SIGNIFICANT EVENTS  1/27  Admit with Flu A+ 1/30  PCCM consulted for evaluation of dyspnea   STUDIES:   I reviewed CXR myself, hyperinflation noted  ASSESSMENT / PLAN:  Discussion:  84 y/o M admitted with dyspnea, malaise in the setting of influenza A+.  Advanced COPD on home O2, prednisone and Trilogy for hypercapnia.  Expect he will be slow to resolve given his advanced lung disease.  Concern will be monitoring for further decompensation / development of post-viral bacterial infection.  Discussed with PCCM-NP and [facility]-MD.  Influenza A Positive:  -  Completed course of tamiflu  - Transition to doxy and discharge with 4 more days of doxy  Acute on Chronic Hypoxemic / Hypercapnic Respiratory Failure   - Pulmonary hygienes  - Recommend decrease prednisone dose to 10 mg daily (home dose) via taper 30, 20 then 10  End-Stage COPD - on 2L O2, trilogy, 10 mg prednisone at baseline   - Trelegy O2 as above  - Titrate O2 for sat of 88-92%  - Ambulated patient and sat dropped to 95% on 2L (home dose)  COPD:  - Prednisone to 10 mg daily as above  -  Bronchodilator as ordered   Anxiety:  - Per primary.  Discharge F/U with PCCM as arranged prior.  Rush Farmer, M.D. [facility] Pulmonary/Critical Care Medicine. Pager: [phone]. After hours pager: [phone].

## 2017-02-09 NOTE — Progress Notes (Signed)
1605 After additional assessment of Dr. Nelda Marseille, patient is cleared to be discharged.   Patient discharged to home. Verbalizes understanding of all discharge instructions including discharge medications and follow up MD visits. Wife present during discharge instructions. Patient transferred home, by wife, with portable O2 from [place].

## 2017-02-09 NOTE — Social Work (Signed)
CSW consulted by MD regarding pt wife having questions about Medicare guidelines.   CSW spoke with pt wife about the need for 3 night inpatient stay in the past 30 days (this stay is qualifying) should they elect to pursue SNF in the future. SNF is not recommended at the current time as pt is ambulating with supervision only and nursing needs can be met through [facility].   CSW also touched on Medicaid as a potential thing to look into should pt require more assistance long term.  CSW encouraged pt and pt wife to speak together and to plan for the future as a pair upon discharge. CSW also provided the SNF packet should pt and pt wife ever decide that may be something they need for pt support.   CSW signing off. Please consult if any additional needs arise.  Alexander Mt, [place] Work [phone]

## 2017-02-09 NOTE — Care Management Important Message (Signed)
Important Message  Patient Details  Name: [name] MRN: [id number] Date of Birth: Feb 22, 1929   Medicare Important Message Given:  Yes    Carles Collet, RN 02/09/2017, 10:51 AM

## 2017-02-09 NOTE — Discharge Instructions (Signed)
Please get your medications reviewed and adjusted by your Primary MD. ° °Please request your Primary MD to go over all Hospital Tests and Procedure/Radiological results at the follow up, please get all Hospital records sent to your Prim MD by signing hospital release before you go home. ° °If you had Pneumonia of Lung problems at the Hospital: °Please get a 2 view Chest X ray done in 6-8 weeks after hospital discharge or sooner if instructed by your Primary MD. ° °If you have Congestive Heart Failure: °Please call your Cardiologist or Primary MD anytime you have any of the following symptoms:  °1) 3 pound weight gain in 24 hours or 5 pounds in 1 week  °2) shortness of breath, with or without a dry hacking cough  °3) swelling in the hands, feet or stomach  °4) if you have to sleep on extra pillows at night in order to breathe ° °Follow cardiac low salt diet and 1.5 lit/day fluid restriction. ° °If you have diabetes °Accuchecks 4 times/day, Once in AM empty stomach and then before each meal. °Log in all results and show them to your primary doctor at your next visit. °If any glucose reading is under 80 or above 300 call your primary MD immediately. ° °If you have Seizure/Convulsions/Epilepsy: °Please do not drive, operate heavy machinery, participate in activities at heights or participate in high speed sports until you have seen by Primary MD or a Neurologist and advised to do so again. ° °If you had Gastrointestinal Bleeding: °Please ask your Primary MD to check a complete blood count within one week of discharge or at your next visit. Your endoscopic/colonoscopic biopsies that are pending at the time of discharge, will also need to followed by your Primary MD. ° °Get Medicines reviewed and adjusted. °Please take all your medications with you for your next visit with your Primary MD ° °Please request your Primary MD to go over all hospital tests and procedure/radiological results at the follow up, please ask your  Primary MD to get all Hospital records sent to his/her office. ° °If you experience worsening of your admission symptoms, develop shortness of breath, life threatening emergency, suicidal or homicidal thoughts you must seek medical attention immediately by calling 911 or calling your MD immediately  if symptoms less severe. ° °You must read complete instructions/literature along with all the possible adverse reactions/side effects for all the Medicines you take and that have been prescribed to you. Take any new Medicines after you have completely understood and accpet all the possible adverse reactions/side effects.  ° °Do not drive or operate heavy machinery when taking Pain medications.  ° °Do not take more than prescribed Pain, Sleep and Anxiety Medications ° °Special Instructions: If you have smoked or chewed Tobacco  in the last 2 yrs please stop smoking, stop any regular Alcohol  and or any Recreational drug use. ° °Wear Seat belts while driving. ° °Please note °You were cared for by a hospitalist during your hospital stay. If you have any questions about your discharge medications or the care you received while you were in the hospital after you are discharged, you can call the unit and asked to speak with the hospitalist on call if the hospitalist that took care of you is not available. Once you are discharged, your primary care physician will handle any further medical issues. Please note that NO REFILLS for any discharge medications will be authorized once you are discharged, as it is imperative that you   return to your primary care physician (or establish a relationship with a primary care physician if you do not have one) for your aftercare needs so that they can reassess your need for medications and monitor your lab values.  You can reach the hospitalist office at phone [phone] or fax [phone]   If you do not have a primary care physician, you can call [phone] for a physician  referral.   Influenza, Adult Influenza, more commonly known as the flu, is a viral infection that primarily affects the respiratory tract. The respiratory tract includes organs that help you breathe, such as the lungs, nose, and throat. The flu causes many common cold symptoms, as well as a high fever and body aches. The flu spreads easily from person to person (is contagious). Getting a flu shot (influenza vaccination) every year is the best way to prevent influenza. What are the causes? Influenza is caused by a virus. You can catch the virus by:  Breathing in droplets from an infected person's cough or sneeze.  Touching something that was recently contaminated with the virus and then touching your mouth, nose, or eyes.  What increases the risk? The following factors may make you more likely to get the flu:  Not cleaning your hands frequently with soap and water or alcohol-based hand sanitizer.  Having close contact with many people during cold and flu season.  Touching your mouth, eyes, or nose without washing or sanitizing your hands first.  Not drinking enough fluids or not eating a healthy diet.  Not getting enough sleep or exercise.  Being under a high amount of stress.  Not getting a yearly (annual) flu shot.  You may be at a higher risk of complications from the flu, such as a severe lung infection (pneumonia), if you:  Are over the age of 23.  Are pregnant.  Have a weakened disease-fighting system (immune system). You may have a weakened immune system if you: ? Have HIV or AIDS. ? Are undergoing chemotherapy. ? Aretaking medicines that reduce the activity of (suppress) the immune system.  Have a long-term (chronic) illness, such as heart disease, kidney disease, diabetes, or lung disease.  Have a liver disorder.  Are obese.  Have anemia.  What are the signs or symptoms? Symptoms of this condition typically last 4-10 days and may  include:  Fever.  Chills.  Headache, body aches, or muscle aches.  Sore throat.  Cough.  Runny or congested nose.  Chest discomfort and cough.  Poor appetite.  Weakness or tiredness (fatigue).  Dizziness.  Nausea or vomiting.  How is this diagnosed? This condition may be diagnosed based on your medical history and a physical exam. Your health care provider may do a nose or throat swab test to confirm the diagnosis. How is this treated? If influenza is detected early, you can be treated with antiviral medicine that can reduce the length of your illness and the severity of your symptoms. This medicine may be given by mouth (orally) or through an IV tube that is inserted in one of your veins. The goal of treatment is to relieve symptoms by taking care of yourself at home. This may include taking over-the-counter medicines, drinking plenty of fluids, and adding humidity to the air in your home. In some cases, influenza goes away on its own. Severe influenza or complications from influenza may be treated in a hospital. Follow these instructions at home:  Take over-the-counter and prescription medicines only as told by your health  care provider.  Use a cool mist humidifier to add humidity to the air in your home. This can make breathing easier.  Rest as needed.  Drink enough fluid to keep your urine clear or pale yellow.  Cover your mouth and nose when you cough or sneeze.  Wash your hands with soap and water often, especially after you cough or sneeze. If soap and water are not available, use hand sanitizer.  Stay home from work or school as told by your health care provider. Unless you are visiting your health care provider, try to avoid leaving home until your fever has been gone for 24 hours without the use of medicine.  Keep all follow-up visits as told by your health care provider. This is important. How is this prevented?  Getting an annual flu shot is the best way  to avoid getting the flu. You may get the flu shot in late summer, fall, or winter. Ask your health care provider when you should get your flu shot.  Wash your hands often or use hand sanitizer often.  Avoid contact with people who are sick during cold and flu season.  Eat a healthy diet, drink plenty of fluids, get enough sleep, and exercise regularly. Contact a health care provider if:  You develop new symptoms.  You have: ? Chest pain. ? Diarrhea. ? A fever.  Your cough gets worse.  You produce more mucus.  You feel nauseous or you vomit. Get help right away if:  You develop shortness of breath or difficulty breathing.  Your skin or nails turn a bluish color.  You have severe pain or stiffness in your neck.  You develop a sudden headache or sudden pain in your face or ear.  You cannot stop vomiting. This information is not intended to replace advice given to you by your health care provider. Make sure you discuss any questions you have with your health care provider. Document Released: 12/24/1999 Document Revised: 06/03/2015 Document Reviewed: 10/20/2014 Elsevier Interactive Patient Education  2017 [employer]  Chronic Obstructive Pulmonary Disease Exacerbation Chronic obstructive pulmonary disease (COPD) is a common lung problem. In COPD, the flow of air from the lungs is limited. COPD exacerbations are times that breathing gets worse and you need extra treatment. Without treatment they can be life threatening. If they happen often, your lungs can become more damaged. If your COPD gets worse, your doctor may treat you with:  Medicines.  Oxygen.  Different ways to clear your airway, such as using a mask.  Follow these instructions at home:  Do not smoke.  Avoid tobacco smoke and other things that bother your lungs.  If given, take your antibiotic medicine as told. Finish the medicine even if you start to feel better.  Only take medicines as told by your  doctor.  Drink enough fluids to keep your pee (urine) clear or pale yellow (unless your doctor has told you not to).  Use a cool mist machine (vaporizer).  If you use oxygen or a machine that turns liquid medicine into a mist (nebulizer), continue to use them as told.  Keep up with shots (vaccinations) as told by your doctor.  Exercise regularly.  Eat healthy foods.  Keep all doctor visits as told. Get help right away if:  You are very short of breath and it gets worse.  You have trouble talking.  You have bad chest pain.  You have blood in your spit (sputum).  You have a fever.  You  keep throwing up (vomiting).  You feel weak, or you pass out (faint).  You feel confused.  You keep getting worse. This information is not intended to replace advice given to you by your health care provider. Make sure you discuss any questions you have with your health care provider. Document Released: 12/15/2010 Document Revised: 06/03/2015 Document Reviewed: 08/30/2012 Elsevier Interactive Patient Education  2017 [employer].

## 2017-02-09 NOTE — Progress Notes (Signed)
Occupational Therapy Treatment Patient Details Name: KEELYN FJELSTAD MRN: [id number] DOB: 11/17/1929 Today's Date: 02/09/2017    History of present illness 84 y.o. male admitted on 02/04/17 for weakness and SOB found to have the flu.  Pt with significant PMH of syncope, CABG, PVD, HTN, COPD, carotid artery occlusion, CAD, asthma, and chronic rhinitis.   OT comments  Pt able to perform ADL and functional mobility today with supervision. DOE 3/4 with SpO2 in mid 90s throughout on supplemental O2. SpO2 dropped to 84% donning socks at EOB but quickly rebounded to mid 90s. Educated pt on energy conservation strategies and provided handout, also educated on use of flutter valve and pursed lip breathing. D/c plan remains appropriate. Will continued to follow acutely.    Follow Up Recommendations  No OT follow up;Supervision - Intermittent    Equipment Recommendations  None recommended by OT    Recommendations for Other Services      Precautions / Restrictions Precautions Precautions: Other (comment) Precaution Comments: monitor O2 sats and DOE with gait Restrictions Weight Bearing Restrictions: No       Mobility Bed Mobility               General bed mobility comments: Pt sitting EOB upon arrival  Transfers Overall transfer level: Needs assistance Equipment used: None Transfers: Sit to/from Stand Sit to Stand: Supervision         General transfer comment: supervision for safety    Balance Overall balance assessment: Needs assistance Sitting-balance support: Feet supported;No upper extremity supported Sitting balance-Leahy Scale: Normal     Standing balance support: No upper extremity supported;During functional activity Standing balance-Leahy Scale: Good                             ADL either performed or assessed with clinical judgement   ADL Overall ADL's : Needs assistance/impaired                     Lower Body Dressing:  Supervision/safety;Sit to/from stand   Toilet Transfer: Supervision/safety;Ambulation           Functional mobility during ADLs: Supervision/safety General ADL Comments: DOE 3/4 with mobiltiy in room. SpO2 in mid 90s throughout. Dropped to 84% on supplemental O2 with donning socks at EOB. Educated pt on energy conservation strategies, pursed lip breathing, and use of flutter valve. Provided pt with energy conservation handout.     Vision       Perception     Praxis      Cognition Arousal/Alertness: Awake/alert Behavior During Therapy: WFL for tasks assessed/performed Overall Cognitive Status: Within Functional Limits for tasks assessed                                          Exercises     Shoulder Instructions       General Comments      Pertinent Vitals/ Pain       Pain Assessment: No/denies pain  Home Living                                          Prior Functioning/Environment              Frequency  Min 2X/week  Progress Toward Goals  OT Goals(current goals can now be found in the care plan section)  Progress towards OT goals: Progressing toward goals  Acute Rehab OT Goals Patient Stated Goal: get back to baseline OT Goal Formulation: With patient  Plan Discharge plan remains appropriate    Co-evaluation                 AM-PAC PT "6 Clicks" Daily Activity     Outcome Measure   Help from another person eating meals?: None Help from another person taking care of personal grooming?: A Little Help from another person toileting, which includes using toliet, bedpan, or urinal?: A Little Help from another person bathing (including washing, rinsing, drying)?: A Little Help from another person to put on and taking off regular upper body clothing?: A Little Help from another person to put on and taking off regular lower body clothing?: None 6 Click Score: 20    End of Session Equipment Utilized  During Treatment: Oxygen  OT Visit Diagnosis: Muscle weakness (generalized) (M62.81)   Activity Tolerance Patient limited by fatigue   Patient Left with family/visitor present;with call bell/phone within reach;Other (comment)(sitting EOB)   Nurse Communication Mobility status;Other (comment)(SpO2 drops with activity at EOB, DOE 3/4)        Time: 0630-1601 OT Time Calculation (min): 19 min  Charges: OT General Charges $OT Visit: 1 Visit OT Treatments $Therapeutic Activity: 8-22 mins  Mabell Esguerra A. Ulice Brilliant, M.S., OTR/L Pager: [place] 02/09/2017, 3:19 PM

## 2017-02-09 NOTE — Telephone Encounter (Signed)
Spoke with Tye [place] and advised message from [facility]. She agreed and will follow up with [place] after his appt.

## 2017-02-09 NOTE — Consult Note (Signed)
[facility] CM Primary Care Navigator  02/09/2017  [name] 02/13/1929 [id number]   Met with patientand wife [name]) at the bedside to identify possible discharge needs. Patientreportshaving "shortness of breath, vertigo, extreme tiredness, chest and abdominal discomfort" which all led to this admission.  Patient endorses thatDr.Alan Harrington Challenger with [place] at [facility] as his primary care provider.Patient reports that his pulmonologist is Dr. Selinda Orion.   [facility] [address] [place] [facility] without difficulty.  Patientreports that he has beenmanaging hisown medications at home with use of "pill box" system filled once a week.  Patient's wifehas been providingtransportation to hisdoctors'appointments.  His wife is the primary caregiver at home as stated.  Discharge plan is home with home health services (PT) per therapy recommendation.   Patientand wife voiced understanding to call primary care provider's officewhenhereturns home,for a post discharge follow-upappointmentwithin1-2weeksor sooner if needs arise.Patient letter (with PCP's contact number) was provided asareminder.  Discussed withpatientand wife regardingTH[facility] CM services available for health managementat home and both expressed interest with it. COPD action plan explained to patient and wife. Patient voiced preference of opting andverbally agreeingtoEMMICOPD callsforfollow-up of hisrecovery at home.   Referral was made for [facility] COPDcalls after discharge.  Patientand wife voicedunderstanding of needto seekreferral from primary care provider to [facility] care management if deemed necessaryand appropriatefor further services in the near future.  [facility] care management information provided for future needs thathemay have.  . For additional questions please contact:  Kylen Schliep A. Gypsy Kellogg,  BSN, RN-BC [facility] PRIMARY CARE Navigator Cell: [phone]

## 2017-02-09 NOTE — Discharge Summary (Signed)
Physician Discharge Summary  [name] [name] DOB: March 21, 1929  PCP: Travis Cruel, MD  Admit date: 02/04/2017 Discharge date: 02/09/2017  Recommendations for Outpatient Follow-up:  1. Travis Form, NP/[name] on 02/14/17 at 3 PM. 2. Dr. Lawerance [name], PCP in 1 week with repeat labs (CBC & BMP).  Home Health: PT Equipment/Devices: None  Discharge Condition: Improved and stable CODE STATUS: Full Diet recommendation: Heart healthy diet.  Discharge Diagnoses:  Active Problems:   Influenza A   Brief Summary: 84 year old married male with PMH of severe COPD, oxygen dependent 2 L/min and on nocturnal ventilation, steroid dependent (prednisone 10 mg daily), CAD status post CABG, BPH, HTN, GERD, presented to ED with generalized weakness, worsening dyspnea, headache, body aches, nonproductive cough and fevers.  In ED found to be influenza A +, fever of 100.7, tachypneic, soft blood pressures and chest x-ray without acute findings.  Admitted for influenza A acute bronchitis and COPD exacerbation.  Assessment and plan:  1. Influenza A acute bronchitis with COPD exacerbation/end-stage COPD: Completed 5 days of Tamiflu.  Empirically treated with IV ceftriaxone and azithromycin given productive sputum.  As per [name], transitioned to oral doxycycline for additional 7 days at discharge.  Treated with oxygen, bronchodilator nebulizations, IV Solu-Medrol >prednisone and flutter valve.  Slowly improved.  As per [name], he was slow to resolve given his advanced lung disease. [name] has seen him again today, agree with current management and discharge home.  Has close outpatient follow-up with [name].  Improved. 2. Acute on chronic hypoxic respiratory failure: Back on home oxygen 2 L/min.  Continue Trelegy.  Outpatient follow-up with [name]. 3. Chronic steroids: As per patient and spouse's report, patient used to be on prednisone 5 mg daily which was  increased at 3-4 weeks ago to 10 mg daily.  They have been instructed regarding prednisone taper and eventually go back to prior dose of prednisone.  They verbalized understanding. 4. Dizziness: Resolved.  Unclear etiology.  Ambulated with [name] and saturating in the 90s on 2 L oxygen.  Therapies recommended home health PT, arranged. 5. Hypotension: In the setting of acute viral illness and dehydration.  Resolved. 6. Essential hypertension: Controlled. 7. CAD status post CABG: No report of chest pain.  Follows with Dr. Wynonia [name], Cardiology. 8. BPH: Continue finasteride. 9. Chronic bilateral leg edema: Continue TED hose. 10. Anxiety: As per prior home regimen. 11. Deconditioning: Secondary to advanced age, frail and multiple significant comorbidities.  Home health PT.   Consultations:  [name]  Procedures:  None   Discharge Instructions  Discharge Instructions    Call MD for:  difficulty breathing, headache or visual disturbances   Complete by:  As directed    Call MD for:  extreme fatigue   Complete by:  As directed    Call MD for:  persistant dizziness or light-headedness   Complete by:  As directed    Call MD for:  temperature >100.4   Complete by:  As directed    Diet - low sodium heart healthy   Complete by:  As directed    Increase activity slowly   Complete by:  As directed        Medication List    STOP taking these medications   azithromycin 250 MG tablet Commonly known as:  ZITHROMAX     TAKE these medications   ALPRAZolam 0.25 MG tablet Commonly known as:  XANAX Take 1 tablet (0.25 mg total) by mouth 3 (three) times daily. Takes 1/2 tablet two  times daily.   aspirin 81 MG tablet Take 81 mg by mouth daily.   atorvastatin 20 MG tablet Commonly known as:  LIPITOR TAKE 1 TABLET BY MOUTH EVERY DAY   b complex vitamins tablet Take 1 tablet by mouth daily.   doxycycline 100 MG tablet Commonly known as:  VIBRA-TABS Take 1 tablet (100 mg total)  by mouth 2 (two) times daily.   finasteride 5 MG tablet Commonly known as:  PROSCAR Take 5 mg by mouth every morning.   guaiFENesin 600 MG 12 hr tablet Commonly known as:  MUCINEX Take by mouth 2 (two) times daily as needed.   mometasone 50 MCG/ACT nasal spray Commonly known as:  NASONEX SHAKE LIQUID AND USE 2 SPRAYS IN EACH NOSTRIL DAILY   MURO 128 OP Place 1 drop 4 (four) times daily into both eyes.   NITROSTAT 0.4 MG SL tablet Generic drug:  nitroGLYCERIN DISSOLVE 1 TABLET UNDER THE TONGUE AS DIRECTED AND AS NEEDED FOR CHEST PAIN   OXYGEN Inhale 2 L continuous into the lungs.   predniSONE 10 MG tablet Commonly known as:  DELTASONE Take 4 tabs daily for 3 days, then 3 tabs daily for 3 days, then 2 tabs daily for 3 days, then continue prior dose of 1 tab daily which is your prior maintenance dose. Start taking on:  02/10/2017 What changed:    how much to take  how to take this  when to take this  additional instructions   PRESERVISION AREDS 2 Caps Take 1 capsule by mouth daily.   ranitidine 150 MG tablet Commonly known as:  ZANTAC Take 150 mg by mouth 2 (two) times daily.   SPIRIVA RESPIMAT 2.5 MCG/ACT Aers Generic drug:  Tiotropium Bromide Monohydrate INHALE 2 PUFFS BY MOUTH EVERY DAY   SYMBICORT 160-4.5 MCG/ACT inhaler Generic drug:  budesonide-formoterol INHALE 2 PUFFS BY MOUTH EVERY MORNING AND THEN ANOTHER 2 PUFFS ABOUT 12 HOURS LATER   TOPROL XL 25 MG 24 hr tablet Generic drug:  metoprolol succinate TAKE 1 TABLET BY MOUTH DAILY   VITAMIN B 12 PO Take 1 tablet daily by mouth.   Vitamin D3 2000 units Tabs Take 2,000 Units by mouth daily.   XOPENEX HFA 45 MCG/ACT inhaler Generic drug:  levalbuterol INHALE 2 PUFFS BY MOUTH EVERY 4 HOURS AS NEEDED FOR WHEEZING OR SHORTNESS OF BREATH      Follow-up Information    Travis Spatz, NP Follow up on 02/14/2017.   Specialty:  Pulmonary Disease Why:  Appt at 3:00 PM.  Please arrive at 2:45 PM for check  in.  Contact information: [address] [place] [address] [phone]        [facility] - Dme Follow up.   Why:  Provide home health services Contact information: [address] [place] [address] [phone]        Travis Cruel, MD. Schedule an appointment as soon as possible for a visit in 1 week(s).   Specialty:  Family Medicine Why:  To be seen with repeat labs (CBC & BMP). Contact information: [address] [place] [address] [phone]          Allergies  Allergen Reactions  . Sulfonamide Derivatives Swelling    REACTION: facial/tongue swelling  . Cefdinir Diarrhea    REACTION: diarrhea  . Ciprofloxacin Itching and Rash    Red itchy hands  . Nitrofurantoin Swelling    Swollen hands  . Levaquin [Levofloxacin] Itching and  Rash      Procedures/Studies: Dg Chest 2 View  Result Date: 02/08/2017 CLINICAL DATA:  Influenza.  Productive cough.  Ex-smoker. EXAM: CHEST  2 VIEW COMPARISON:  02/06/2017 FINDINGS: Hyperinflation. Mild pectus excavatum deformity. Midline trachea. Normal heart size. Atherosclerosis in the transverse aorta. Prior median sternotomy. Left costophrenic angle blunting on the frontal is chronic and likely due to pleural thickening. There is also mild left apical pleural thickening. No lobar consolidation. IMPRESSION: Hyperinflation, without acute disease. Aortic Atherosclerosis (ICD10-I70.0). Electronically Signed   By: Abigail Miyamoto M.D.   On: 02/08/2017 09:06   Dg Chest 2 View  Result Date: 02/06/2017 CLINICAL DATA:  Cough. Diagnosed with flu on Saturday 02/03/2017. Fever back to normal on Sunday. Former smoker. EXAM: CHEST  2 VIEW COMPARISON:  02/04/2017 FINDINGS: Postoperative changes in the mediastinum. Normal heart size and pulmonary vascularity. Prominent emphysematous changes in the lungs. Peribronchial thickening with central interstitial changes consistent with chronic bronchitis. No  airspace disease or consolidation in the lungs. No blunting of costophrenic angles. No pneumothorax. Mediastinal contours appear intact. Calcification of the aorta. Degenerative changes in the spine. IMPRESSION: Prominent emphysematous changes and chronic bronchitic changes in the lungs. No evidence of active pulmonary disease. Electronically Signed   By: Lucienne Capers M.D.   On: 02/06/2017 23:00   Dg Chest 2 View  Result Date: 02/04/2017 CLINICAL DATA:  Patient with history of COPD. EXAM: CHEST  2 VIEW COMPARISON:  Chest radiograph 01/11/2017 FINDINGS: Monitoring leads overlie the patient. Stable cardiac and mediastinal contours. No consolidative pulmonary opacities. Pulmonary hyperinflation. No pleural effusion or pneumothorax. Thoracic spine degenerative changes. Lateral view nondiagnostic due to overlapping soft tissues superiorly. IMPRESSION: No acute osseous abnormality. Electronically Signed   By: Lovey Newcomer M.D.   On: 02/04/2017 16:31     Subjective: Overall feels much better than on admission.  Still has some chest congestion and is unable to bring up sputum.  Has been using his flutter valve.  Intermittent mild wheezing.  Dyspnea significantly improved.  No chest pain or pain elsewhere.  No nausea, vomiting or diarrhea.  Discharge Exam:  Vitals:   02/08/17 1423 02/08/17 2221 02/09/17 0553 02/09/17 1019  BP: (!) 165/87 136/81 (!) 136/53   Pulse: 85 69 66   Resp: 18 17 17    Temp: 98.3 F (36.8 C) 98 F (36.7 C) 98 F (36.7 C)   TempSrc: Oral Oral    SpO2: 99% 91% 96% 97%  Weight:      Height:        General: Elderly male, moderately built, frail, chronically ill looking, sitting up comfortably in bed.  Did not appear in any distress. Cardiovascular: S1 & S2 heard, RRR, S1/S2 +. No murmurs, rubs, gallops or clicks. No JVD.  1+ pitting bilateral leg edema with bilateral leg compression stockings. Respiratory: Clear to auscultation posteriorly.  Occasional rhonchi left lower  anterior chest but otherwise clear anteriorly too.  No crackles heard.  No increased work of breathing.  Able to speak in full sentences. Abdominal:  Non distended, non tender & soft. No organomegaly or masses appreciated. Normal bowel sounds heard. CNS: Alert and oriented. No focal deficits. Extremities: no edema, no cyanosis    The results of significant diagnostics from this hospitalization (including imaging, microbiology, ancillary and laboratory) are listed below for reference.     Microbiology: Recent Results (from the past 240 hour(s))  Blood Culture (routine x 2)     Status: None (Preliminary result)   Collection Time: 02/04/17  3:22 PM  Result Value Ref Range Status   Specimen Description BLOOD RIGHT FOREARM  Final   Special Requests   Final    BOTTLES DRAWN AEROBIC AND ANAEROBIC Blood Culture adequate volume   Culture NO GROWTH 4 DAYS  Final   Report Status PENDING  Incomplete  Blood Culture (routine x 2)     Status: None (Preliminary result)   Collection Time: 02/04/17  3:31 PM  Result Value Ref Range Status   Specimen Description BLOOD LEFT WRIST  Final   Special Requests   Final    BOTTLES DRAWN AEROBIC AND ANAEROBIC Blood Culture adequate volume   Culture NO GROWTH 4 DAYS  Final   Report Status PENDING  Incomplete     Labs: CBC: Recent Labs  Lab 02/04/17 1429 02/04/17 1513 02/05/17 0732  WBC 9.9 10.1 5.3  NEUTROABS  --  8.6*  --   HGB 13.2 13.6 11.7*  HCT 39.9 41.2 35.6*  MCV 98.5 98.3 97.5  PLT 160 158 412   Basic Metabolic Panel: Recent Labs  Lab 02/04/17 1429 02/05/17 0732  NA 137 137  K 4.1 4.1  CL 98* 102  CO2 28 26  GLUCOSE 102* 135*  BUN 15 16  CREATININE 1.11 0.89  CALCIUM 9.4 8.5*   Liver Function Tests: Recent Labs  Lab 02/05/17 0732  AST 27  ALT 19  ALKPHOS 87  BILITOT 0.7  PROT 4.9*  ALBUMIN 3.0*    Discussed in detail with patient and spouse.  Updated care and answered questions.  Time coordinating discharge: Over 30  minutes  SIGNED:  Vernell Leep, MD, FACP, [facility]. [facility] Pager [phone]  If 7PM-7AM, please contact night-coverage [email or web address] Password [email or web address] 02/09/2017, 1:58 PM

## 2017-02-12 DIAGNOSIS — R609 Edema, unspecified: Secondary | ICD-10-CM | POA: Diagnosis not present

## 2017-02-12 DIAGNOSIS — J329 Chronic sinusitis, unspecified: Secondary | ICD-10-CM | POA: Diagnosis not present

## 2017-02-12 DIAGNOSIS — R899 Unspecified abnormal finding in specimens from other organs, systems and tissues: Secondary | ICD-10-CM | POA: Diagnosis not present

## 2017-02-13 DIAGNOSIS — N400 Enlarged prostate without lower urinary tract symptoms: Secondary | ICD-10-CM | POA: Diagnosis not present

## 2017-02-13 DIAGNOSIS — Z7952 Long term (current) use of systemic steroids: Secondary | ICD-10-CM | POA: Diagnosis not present

## 2017-02-13 DIAGNOSIS — J441 Chronic obstructive pulmonary disease with (acute) exacerbation: Secondary | ICD-10-CM | POA: Diagnosis not present

## 2017-02-13 DIAGNOSIS — Z7982 Long term (current) use of aspirin: Secondary | ICD-10-CM | POA: Diagnosis not present

## 2017-02-13 DIAGNOSIS — Z951 Presence of aortocoronary bypass graft: Secondary | ICD-10-CM | POA: Diagnosis not present

## 2017-02-13 DIAGNOSIS — I739 Peripheral vascular disease, unspecified: Secondary | ICD-10-CM | POA: Diagnosis not present

## 2017-02-13 DIAGNOSIS — I2510 Atherosclerotic heart disease of native coronary artery without angina pectoris: Secondary | ICD-10-CM | POA: Diagnosis not present

## 2017-02-13 DIAGNOSIS — Z7951 Long term (current) use of inhaled steroids: Secondary | ICD-10-CM | POA: Diagnosis not present

## 2017-02-13 DIAGNOSIS — J09X2 Influenza due to identified novel influenza A virus with other respiratory manifestations: Secondary | ICD-10-CM | POA: Diagnosis not present

## 2017-02-13 DIAGNOSIS — Z87891 Personal history of nicotine dependence: Secondary | ICD-10-CM | POA: Diagnosis not present

## 2017-02-13 DIAGNOSIS — E785 Hyperlipidemia, unspecified: Secondary | ICD-10-CM | POA: Diagnosis not present

## 2017-02-13 DIAGNOSIS — K219 Gastro-esophageal reflux disease without esophagitis: Secondary | ICD-10-CM | POA: Diagnosis not present

## 2017-02-13 DIAGNOSIS — J9611 Chronic respiratory failure with hypoxia: Secondary | ICD-10-CM | POA: Diagnosis not present

## 2017-02-13 DIAGNOSIS — J9612 Chronic respiratory failure with hypercapnia: Secondary | ICD-10-CM | POA: Diagnosis not present

## 2017-02-13 DIAGNOSIS — I10 Essential (primary) hypertension: Secondary | ICD-10-CM | POA: Diagnosis not present

## 2017-02-14 ENCOUNTER — Ambulatory Visit (INDEPENDENT_AMBULATORY_CARE_PROVIDER_SITE_OTHER): Payer: Medicare Other | Admitting: Acute Care

## 2017-02-14 ENCOUNTER — Other Ambulatory Visit: Payer: Self-pay | Admitting: *Deleted

## 2017-02-14 ENCOUNTER — Ambulatory Visit (INDEPENDENT_AMBULATORY_CARE_PROVIDER_SITE_OTHER)
Admission: RE | Admit: 2017-02-14 | Discharge: 2017-02-14 | Disposition: A | Discharge status: Home / Self Care | Payer: Medicare Other | Source: Ambulatory Visit | Attending: Acute Care | Admitting: Acute Care

## 2017-02-14 ENCOUNTER — Encounter: Payer: Self-pay | Admitting: Acute Care

## 2017-02-14 VITALS — BP 154/70 | HR 85 | Ht 69.0 in | Wt 138.0 lb

## 2017-02-14 DIAGNOSIS — R0602 Shortness of breath: Secondary | ICD-10-CM | POA: Diagnosis not present

## 2017-02-14 DIAGNOSIS — J9611 Chronic respiratory failure with hypoxia: Secondary | ICD-10-CM

## 2017-02-14 DIAGNOSIS — J328 Other chronic sinusitis: Secondary | ICD-10-CM | POA: Diagnosis not present

## 2017-02-14 DIAGNOSIS — J9612 Chronic respiratory failure with hypercapnia: Secondary | ICD-10-CM

## 2017-02-14 DIAGNOSIS — J449 Chronic obstructive pulmonary disease, unspecified: Secondary | ICD-10-CM

## 2017-02-14 NOTE — Assessment & Plan Note (Signed)
Continue Flonase and Nasonex as needed

## 2017-02-14 NOTE — Assessment & Plan Note (Addendum)
Recent flare triggered by positive influenza requiring hospitalization Plan CXR today We will continue Doxycycline until completed. Continue Prednisone taper as you have been doing, resume 10 mg daily once completed Continue your Symbicort abd Spiriva as you have been doing Try Biotene for mouth dryness. Work with physical therapy on conditioning. Follow up with Dr. [name] in 2 weeks If your leg swelling does not improve, follow up with Dr.Tilley Follow up re blood pressure which was a bit high today. Please contact office for sooner follow up if symptoms do not improve or worsen or seek emergency care  Continue wearing Trelegy at night until you discuss this with Dr. [name].

## 2017-02-14 NOTE — Progress Notes (Signed)
**Note DeTravis SparksIdentified via Obfuscation** History of Present Illness [name] is a 84 y.o. male with GOLD III COPD with some reversibility and 02 dep at rest and with activity . He is followed by Dr. [name].   Maintenance medication: GOLD III / maint rx symbicort/ spiriva Per Dr. Gustavus Bryant last assessment: Very severe dz and only option left at this point is to gradually phase in palliative measures starting with titrating up xanax to max of 0.5 qid then consider clonazepam   Hospital Admission Details:02/04/2017- 02/09/2017 84 year old married male with PMH of severe COPD, oxygen dependent 2 L/min and on nocturnal ventilation, steroid dependent (prednisone 10 mg daily), CAD status post CABG, BPH, HTN, GERD, presented to ED with generalized weakness, worsening dyspnea, headache, body aches, nonproductive cough and fevers.  In ED found to be influenza A +, fever of 100.7, tachypneic, soft blood pressures and chest xTravis Sparksray without acute findings.  Admitted for influenza A acute bronchitis and COPD exacerbation. He was treated with IV ceftriaxone and azithromycin, transitioned to oral doxycycline for an additional 7 days. He was also treated with oxygen, bronchodilator nebulizations, IV SoluTravis SparksMedrol >prednisone and flutter valve. He was slow to recover due to his advanced lung disease.He was discharged home on  home oxygen 2 L/min,  Trelegy ventilation, and home dose of prednisone.  He is very frail, and deconditioned.     02/14/2017 Hospital Follow  Pt. Presents for follow up. He states she is still weak. He has dry mouth, fatigue, edema in his lower extremities,  And nasal congestion. He was seen by his PCP 2 days ago. He had a CBC at that time, WBC was within normal limits at 5.3. He is on the prednisone taper prescribed at hospital discharge.  He is also completing his oral doxycycline dosing prescribed at discharge.  He is very deconditioned. He does not have fever. He is complaining of nasal congestion.  He states he does not have discolored  secretions coming from his nose.  He is taking Afrin and Nasonex to treat symptoms.  He is also using sinus irrigations.  He is concerned about how long it is taking him to recover from his hospitalization.  He will have physical therapy starting at home this next week to address the deconditioning.  He was evaluated by physical therapy at home this past week.  He is complaining of some visual changes, blood pressure is elevated today in the office.  He denies fever, chest pain, orthopnea, or hemoptysis.  He is using his trilogy bilevel machine at bedtime.  He states that comforts him at night.  We discussed the rationale behind using the machine.  He is complaining of some lower extremity edema.  He has been advised by his PCP to increase his Lasix dosage.  Patient is followed by Dr. Wynonia Lawman cardiology, and has not had a 2D echo in approximately 2 years per our records.  Patient did have an EKG recently, which the patient states was unremarkable   Test Results: 02/08/2017>> chest xTravis Sparksray  IMPRESSION: Hyperinflation, without acute disease.   TwelveTravis Sparkslead EKG 02/05/2017 Sinus tach, biatrial enlargement, right bundle branch block, septal infarct age undetermined, abnormal EKG    - PFTs  05/04/05 FEV1 36% ratio 28% with 29% improvement after bronchodilators DLCO 48%  - PFT's 03/26/08 30% ratio 34 with 14% improvement after bronchodilators DLC0 38 %  - Referred to rehab 12/19/2012 > completed March 2015  - changed to spiriva respimat 02/14/2013  - started daily prednisone 06/19/13 > improved only a little>  tapered off mid July 2015 > ok to change to prn prednisone 08/27/2013  - flared off nasonex and gerd rx 09/21/2014 > resumed - 10/19/2014  p extensive coaching HFA effectiveness =    90%  - changed pred to ceiling of 20 / floor of 5 mg daily as of 10/19/2014 > changed to floor of 10 mg daily 10/04/2015  - PFT's  06/08/2015  FEV1 0.58 (22 % ) ratio 27  p No sign % improvement from saba p symb/spiriva prior to  study with DLCO  9/9 % corrects to 27 % for alv volume   - changed pred to ceiling of 20 and floor of 5 mg daily 06/13/16  - referred back to rehab 09/13/2016   Start noct 02 at 2lpm 2008 and on 03/04/09 desat @ > 185 ft so rec wear with activtiy > rm to rm  - 02/15/2011   Walked RA x one lap @ 185 stopped due to  desat > corrected on 2lpm - 02/26/2014  Walked 2lpm  2 laps @ 185 ft each stopped due to  Sob/ sats 88% at nl pace  - 10/19/2014   Walked RA  2 laps @ 185 ft each stopped due to  End of study, slow pace,  sob  But no  desat   05/21/15 Eval by Dr Dedio/ rec trial of NIV  - HCO3  10/27/15 = 34  - HC03  10/06/16  = 28 ? On noct vent (non compliant)  rx as of 01/11/2017  = rec d/c noct vent    >> was resumed upon discharge from hospital 02/09/2017    CBC Latest Ref Rng & Units 02/05/2017 02/04/2017 02/04/2017  WBC 4.0 - 10.5 K/uL 5.3 10.1 9.9  Hemoglobin 13.0 - 17.0 g/dL 11.7(L) 13.6 13.2  Hematocrit 39.0 - 52.0 % 35.6(L) 41.2 39.9  Platelets 150 - 400 K/uL 153 158 160    BMP Latest Ref Rng & Units 02/05/2017 02/04/2017 10/03/2016  Glucose 65 - 99 mg/dL 135(H) 102(H) 105(H)  BUN 6 - 20 mg/dL 16 15 16   Creatinine 0.61 - 1.24 mg/dL 0.89 1.11 0.89  BUN/Creat Ratio 10 - 24 - - 18  Sodium 135 - 145 mmol/L 137 137 142  Potassium 3.5 - 5.1 mmol/L 4.1 4.1 4.1  Chloride 101 - 111 mmol/L 102 98(L) 98  CO2 22 - 32 mmol/L 26 28 28   Calcium 8.9 - 10.3 mg/dL 8.5(L) 9.4 9.5    BNP No results found for: BNP  ProBNP    Component Value Date/Time   PROBNP 71.0 04/15/2015 1118    PFT    Component Value Date/Time   FEV1PRE 0.54 06/08/2015 1448   FEV1POST 0.58 06/08/2015 1448   FVCPRE 1.90 06/08/2015 1448   FVCPOST 2.15 06/08/2015 1448   TLC 6.87 06/08/2015 1448   DLCOUNC 2.79 06/08/2015 1448   PREFEV1FVCRT 28 06/08/2015 1448   PSTFEV1FVCRT 27 06/08/2015 1448    Dg Chest 2 View  Result Date: 02/14/2017 CLINICAL DATA:  Shortness of breath.  COPD. EXAM: CHEST  2 VIEW COMPARISON:  02/08/2017  FINDINGS: The heart size and pulmonary vascularity are normal. CABG. Hyperinflated lungs with flattening of the diaphragm. No infiltrates or effusions. No acute bone abnormality. Aortic atherosclerosis IMPRESSION: No acute abnormality. Aortic Atherosclerosis (ICD10Travis SparksI70.0) and Emphysema (ICD10Travis SparksJ43.9). Electronically Signed   By: Lorriane Shire M.D.   On: 02/14/2017 16:12   Dg Chest 2 View  Result Date: 02/08/2017 CLINICAL DATA:  Influenza.  Productive cough.  ExTravis Sparkssmoker. EXAM: CHEST  2 VIEW COMPARISON:  02/06/2017 FINDINGS: Hyperinflation. Mild pectus excavatum deformity. Midline trachea. Normal heart size. Atherosclerosis in the transverse aorta. Prior median sternotomy. Left costophrenic angle blunting on the frontal is chronic and likely due to pleural thickening. There is also mild left apical pleural thickening. No lobar consolidation. IMPRESSION: Hyperinflation, without acute disease. Aortic Atherosclerosis (ICD10Travis SparksI70.0). Electronically Signed   By: Abigail Miyamoto M.D.   On: 02/08/2017 09:06   Dg Chest 2 View  Result Date: 02/06/2017 CLINICAL DATA:  Cough. Diagnosed with flu on Saturday 02/03/2017. Fever back to normal on Sunday. Former smoker. EXAM: CHEST  2 VIEW COMPARISON:  02/04/2017 FINDINGS: Postoperative changes in the mediastinum. Normal heart size and pulmonary vascularity. Prominent emphysematous changes in the lungs. Peribronchial thickening with central interstitial changes consistent with chronic bronchitis. No airspace disease or consolidation in the lungs. No blunting of costophrenic angles. No pneumothorax. Mediastinal contours appear intact. Calcification of the aorta. Degenerative changes in the spine. IMPRESSION: Prominent emphysematous changes and chronic bronchitic changes in the lungs. No evidence of active pulmonary disease. Electronically Signed   By: Lucienne Capers M.D.   On: 02/06/2017 23:00   Dg Chest 2 View  Result Date: 02/04/2017 CLINICAL DATA:  Patient with history of  COPD. EXAM: CHEST  2 VIEW COMPARISON:  Chest radiograph 01/11/2017 FINDINGS: Monitoring leads overlie the patient. Stable cardiac and mediastinal contours. No consolidative pulmonary opacities. Pulmonary hyperinflation. No pleural effusion or pneumothorax. Thoracic spine degenerative changes. Lateral view nondiagnostic due to overlapping soft tissues superiorly. IMPRESSION: No acute osseous abnormality. Electronically Signed   By: Lovey Newcomer M.D.   On: 02/04/2017 16:31     Past medical hx Past Medical History:  Diagnosis Date  . Asthma   . BPH (benign prostatic hyperplasia)    severe; s/p multiple biopsies  . CAD (coronary artery disease)   . Carotid artery occlusion   . Chronic rhinitis   . COPD (chronic obstructive pulmonary disease) (HCC)    2L [place] O2  . Hyperlipidemia   . Hypertension   . On home oxygen therapy    "~ 24/7" (02/08/2017)  . Peripheral vascular disease ([place])   . Syncope and collapse   . Ureteral tumor 08/2015   had endoscopic procedure for evaluation, unable to reach for biopsy     Social History   Tobacco Use  . Smoking status: Former Smoker    Packs/day: 2.00    Years: 33.00    Pack years: 66.00    Types: Cigarettes    Last attempt to quit: 01/09/1982    Years since quitting: 35.1  . Smokeless tobacco: Never Used  Substance Use Topics  . Alcohol use: Yes    Alcohol/week: 0.0 oz    Comment: 1.5[name]3 oz daily in the past, minimal use in the last few months  . Drug use: No    Mr.Valiente reports that he quit smoking about 35 years ago. His smoking use included cigarettes. He has a 66.00 packTravis Sparksyear smoking history. he has never used smokeless tobacco. He reports that he drinks alcohol. He reports that he does not use drugs.  Tobacco Cessation: Former smoker with a 66Travis SparkspackTravis Sparksyear smoking history quit January 1984  Past surgical hx, Family hx, Social hx all reviewed.  Current Outpatient Medications on File Prior to Visit  Medication Sig  . ALPRAZolam (XANAX) 0.25  MG tablet Take 1 tablet (0.25 mg total) by mouth 3 (three) times daily. Takes 1/2 tablet two times daily.  Marland Kitchen aspirin 81 MG tablet Take 81 mg  by mouth daily.  Marland Kitchen atorvastatin (LIPITOR) 20 MG tablet TAKE 1 TABLET BY MOUTH EVERY DAY  . b complex vitamins tablet Take 1 tablet by mouth daily.  . Cholecalciferol (VITAMIN D3) 2000 units TABS Take 2,000 Units by mouth daily.  . Cyanocobalamin (VITAMIN B 12 PO) Take 1 tablet daily by mouth.   . doxycycline (VIBRATravis SparksTABS) 100 MG tablet Take 1 tablet (100 mg total) by mouth 2 (two) times daily.  . finasteride (PROSCAR) 5 MG tablet Take 5 mg by mouth every morning.  Marland Kitchen guaiFENesin (MUCINEX) 600 MG 12 hr tablet Take by mouth 2 (two) times daily as needed.  . mometasone (NASONEX) 50 MCG/ACT nasal spray SHAKE LIQUID AND USE 2 SPRAYS IN EACH NOSTRIL DAILY  . Multiple VitaminsTravis SparksMinerals (PRESERVISION AREDS 2) CAPS Take 1 capsule by mouth daily.  Marland Kitchen NITROSTAT 0.4 MG SL tablet DISSOLVE 1 TABLET UNDER THE TONGUE AS DIRECTED AND AS NEEDED FOR CHEST PAIN  . OXYGEN Inhale 2 L continuous into the lungs.   . predniSONE (DELTASONE) 10 MG tablet Take 4 tabs daily for 3 days, then 3 tabs daily for 3 days, then 2 tabs daily for 3 days, then continue prior dose of 1 tab daily which is your prior maintenance dose.  . ranitidine (ZANTAC) 150 MG tablet Take 150 mg by mouth 2 (two) times daily.  . Sodium Chloride, Hypertonic, (MURO 128 OP) Place 1 drop 4 (four) times daily into both eyes.  Marland Kitchen SPIRIVA RESPIMAT 2.5 MCG/ACT AERS INHALE 2 PUFFS BY MOUTH EVERY DAY  . SYMBICORT 160[name]4.5 MCG/ACT inhaler INHALE 2 PUFFS BY MOUTH EVERY MORNING AND THEN ANOTHER 2 PUFFS ABOUT 12 HOURS LATER  . TOPROL XL 25 MG 24 hr tablet TAKE 1 TABLET BY MOUTH DAILY  . XOPENEX HFA 45 MCG/ACT inhaler INHALE 2 PUFFS BY MOUTH EVERY 4 HOURS AS NEEDED FOR WHEEZING OR SHORTNESS OF BREATH   No current facilityTravis Sparksadministered medications on file prior to visit.      Allergies  Allergen Reactions  . Sulfonamide  Derivatives Swelling    REACTION: facial/tongue swelling  . Cefdinir Diarrhea    REACTION: diarrhea  . Ciprofloxacin Itching and Rash    Red itchy hands  . Nitrofurantoin Swelling    Swollen hands  . Levaquin [Levofloxacin] Itching and Rash    Review Of Systems:  Constitutional:   No  weight loss, night sweats,  Fevers, chills, +fatigue, or  lassitude.  HEENT:   No headaches,  Difficulty swallowing,  Tooth/dental problems, or  Sore throat,                No sneezing, itching, ear ache,+ nasal congestion, +post nasal drip,+ dry mouth  CV:  No chest pain,  Orthopnea, PND, +swelling in lower extremities, anasarca, +dizziness, palpitations, syncope.   GI  No heartburn, indigestion, abdominal pain, nausea, vomiting, diarrhea, change in bowel habits, loss of appetite, bloody stools.   Resp: + Baseline shortness of breath with exertion or at rest.  + excess mucus, no productive cough,  No nonTravis Sparksproductive cough,  No coughing up of blood.  No change in color of mucus.  No wheezing.  No chest wall deformity  Skin: no rash or lesions.  GU: no dysuria, change in color of urine, no urgency or frequency.  No flank pain, no hematuria   MS:  No joint pain or swelling.  + decreased range of motion.  No back pain.  Psych:  No change in mood or affect. No depression or anxiety.  No memory  loss.   Vital Signs BP (!) 154/70 (BP Location: Left Arm, Cuff Size: Normal)   Pulse 85   Ht 5\' 9"  (1.753 m)   Wt 138 lb (62.6 kg)   SpO2 92%   BMI 20.38 kg/m    Physical Exam:  General- No distress,  A&Ox3, anxious elderly male wearing nasal cannula ENT: No sinus tenderness, TM edematous, pale nasal mucosa, no oral exudate,+ post nasal drip, no LAN Cardiac: S1, S2, regular rate and rhythm, no murmur Chest: No wheeze/ rales/ dullness; no accessory muscle use, no nasal flaring, no sternal retractions, prolonged expiratory phase, diminished per bases Abd.: Soft NonTravis Sparkstender, nondistended, bowel sounds  positive Ext: No clubbing cyanosis, , bilateral lower extremity 1+ edema Neuro: Very deconditioned at baseline, moving all extremities x4, alert and oriented x3, appropriate Skin: No rashes, warm and dry, thin and frail Psych: Anxious, does not understand why he is not getting better more quickly despite serious chronic health comorbidities and recent hospitalization   Assessment/Plan  COPD GOLD IV/ 02 dep /steroid dep Recent flare triggered by positive influenza requiring hospitalization Plan CXR today We will continue Doxycycline until completed. Continue Prednisone taper as you have been doing, resume 10 mg daily once completed Continue your Symbicort abd Spiriva as you have been doing Try Biotene for mouth dryness. Work with physical therapy on conditioning. Follow up with Dr. [name] in 2 weeks If your leg swelling does not improve, follow up with Dr.Tilley Follow up re blood pressure which was a bit high today. Please contact office for sooner follow up if symptoms do not improve or worsen or seek emergency care  Continue wearing Trelegy at night until you discuss this with Dr. [name].   Chronic respiratory failure with hypoxia and hypercapnia (HCC) Continue wearing oxygen at 2 L at rest, 3 L with exertion Oxygen saturation goal is 88[name]92%  Sinusitis, chronic Continue Flonase and Nasonex as needed    Magdalen Spatz, NP 02/14/2017  5:58 PM

## 2017-02-14 NOTE — Assessment & Plan Note (Signed)
Continue wearing oxygen at 2 L at rest, 3 L with exertion Oxygen saturation goal is 88-92%

## 2017-02-14 NOTE — Patient Instructions (Addendum)
It is nice to see you today. CXR today We will continue Doxycycline until completed. Continue Prednisone taper as you have been doing Continue your Symbicort abd Spiriva as you have been doing Try Biotene for mouth dryness. Work with physical therapy on conditioning. Follow up with Dr. [name] as scheduled If your leg swelling does not improve, follow up with Dr.Tilley Follow up re blood pressure which was a bit high today. Please contact office for sooner follow up if symptoms do not improve or worsen or seek emergency care  Continue wearing Trelegy at night until you discuss this with Dr. [name].

## 2017-02-14 NOTE — Patient Outreach (Addendum)
[facility]) Care Management  02/14/2017  EDDI HYMES 1929-02-16 [id number]  EMMI- COPD RED ON EMMI ALERT DAY#: 1 DATE: 02/13/17 RED ALERT: COPD  Outreach attempt #1 to patient. Family member requested a return phone call at a later time. Patient was unavailable. Family member stated, patient is in the MD's office and he is not doing well.   Plan: RN CM will contact patient within one week.    Lake Bells, RN, BSN, MHA/MSL, [place] Telephonic Care Manager Coordinator [facility] Direct Phone: [phone] Toll Free: [phone] Fax: [phone]

## 2017-02-15 ENCOUNTER — Ambulatory Visit: Payer: Self-pay | Admitting: *Deleted

## 2017-02-16 ENCOUNTER — Telehealth: Payer: Self-pay | Admitting: Internal Medicine

## 2017-02-16 ENCOUNTER — Ambulatory Visit: Payer: Medicare Other | Admitting: Internal Medicine

## 2017-02-16 NOTE — Telephone Encounter (Signed)
Called Manus Gunning to give her a verbal, nothing further is needed.

## 2017-02-16 NOTE — Telephone Encounter (Signed)
Ok

## 2017-02-16 NOTE — Telephone Encounter (Signed)
OV notes have been faxed. Nothing else needed at time of call.

## 2017-02-16 NOTE — Telephone Encounter (Signed)
Spoke with [name], she just needs an order for pallative care. MW please advise.

## 2017-02-16 NOTE — Telephone Encounter (Signed)
[name] is [phone] with [facility] calling stating she needs OV note and not telephone note from patient's visit where trelegy was discussed faxed to [phone].

## 2017-02-16 NOTE — Telephone Encounter (Signed)
Spoke with [employer]. Advised her that the patient was seen by SG on 02/14/17. Patient was advised to continue Triology until his follow up visit with MW. Patient has an appt with MW on 02/28/17.   I faxed over a telephone note from 02/08/17 that stated that the patient needed to use the machine until he is seen by MW to [place]. She will be sending over a RX for the machine so that patient will be able to keep the machine if he really needs it. Fax to triage was given.

## 2017-02-17 ENCOUNTER — Telehealth: Payer: Self-pay | Admitting: Pulmonary Disease

## 2017-02-17 MED ORDER — AZITHROMYCIN 250 MG PO TABS: ORAL_TABLET | ORAL | 0 refills | Status: DC

## 2017-02-17 NOTE — Telephone Encounter (Signed)
0He was in hospital recently for flu.  He finished doxycycline and is on tapering of prednisone.  Now with increased cough, yellow/green sputum, wheeze, more short of breath.  Advised him to increase prednisone from 20 to 30 mg daily.  Will send in script for Zpak.  Advised him to call office on 02/19/17 if not improving.

## 2017-02-19 ENCOUNTER — Other Ambulatory Visit: Payer: Self-pay | Admitting: *Deleted

## 2017-02-19 DIAGNOSIS — I10 Essential (primary) hypertension: Secondary | ICD-10-CM | POA: Diagnosis not present

## 2017-02-19 DIAGNOSIS — J9612 Chronic respiratory failure with hypercapnia: Secondary | ICD-10-CM | POA: Diagnosis not present

## 2017-02-19 DIAGNOSIS — J9611 Chronic respiratory failure with hypoxia: Secondary | ICD-10-CM | POA: Diagnosis not present

## 2017-02-19 DIAGNOSIS — I2510 Atherosclerotic heart disease of native coronary artery without angina pectoris: Secondary | ICD-10-CM | POA: Diagnosis not present

## 2017-02-19 DIAGNOSIS — J441 Chronic obstructive pulmonary disease with (acute) exacerbation: Secondary | ICD-10-CM | POA: Diagnosis not present

## 2017-02-19 DIAGNOSIS — J09X2 Influenza due to identified novel influenza A virus with other respiratory manifestations: Secondary | ICD-10-CM | POA: Diagnosis not present

## 2017-02-19 NOTE — Telephone Encounter (Signed)
He needs to come in for an ROV.

## 2017-02-19 NOTE — Telephone Encounter (Signed)
Beth from [facility], [place] assistant, [phone], calling to report patient is still not feeling well, has taken increased prednisone and furosemide 40 mg a day and zpack as Dr. Halford Chessman advised.  States ankles are still very swollen.  Patient complains of head congestion.  States vitals are good.  States a couple of months ago patient took RX nasal spray and he took that this am and it made him really dizzy.  States has done all of his med treatments except for nebulizer.  States if needs to see patient in the office, call patient's home number for wife to bring him in.   Vitals - BP sitting 140/81, P 104, O2 sats 98%, T 98.3

## 2017-02-19 NOTE — Telephone Encounter (Signed)
Spoke with Beth at [facility], states that pt is not improving since increasing prednisone and starting zpak.  C/o b/l ankle swelling, sinus congestion, sob, prod cough with yellow mucus.  Requesting additional recs.    VS please advise.  Thanks.

## 2017-02-19 NOTE — Telephone Encounter (Signed)
Spoke with Beth who is aware of recs. Called pt, scheduled to see MW tomorrow at 12:00.  Nothing further needed.

## 2017-02-19 NOTE — Patient Outreach (Signed)
[facility]) Care Management  02/16/2017  [name] [birth year] [id number]  Outgoing telephone call to patient. HIPAA identifiers X's 2 verified. Patient reported, he is not doing well. Patient verbalized having a history of chronic COPD. He uses a Trilogy machine at night, continuous Oxygen, and nebulizer. Patient reported, he was recently in the hospital for 5 days for the flu. After being discharged from the hospital, he has continued to be weak, tired, and not breathing good. He has copious amounts of congestion and productive secretions. He experiences shortness of breath with minimum exertion, which causes his head to as if it's going to "explode". He reported his Oxygen level has been in the upper 80's to low 90's. He would like to have his Oxygen level remain in the 90's. Patient stated, he's been prescribed several medications to help relieve the congestion and nothing seems to help. He had a follow-up discharge hospital appointment on 02/14/17. He takes his medications as prescribed, including Prednisone and Doxycycline. Patient stated, [place] (PT) will start services next week. Patient verbalized having a COPD action plan, which includes pursed lip breathing, Oxygen, Inhalers, and Nebulizer. [facility] services and benefits explained to patient. Patient declined a Charity fundraiser and agreed to a Consulting civil engineer.  Plan: RM CM will send successful outreach letter to patient.. RN CM advised patient to contact RNCM for any needs or concerns. RN CM advised patient to alert MD for any changes in conditions.  RN CM provided patient with [facility] 24hr Nurse Line contact info. RN CM will contact patient within one week.  RN CM will send patient EMMI educational materials.   Lake Bells, RN, BSN, MHA/MSL, [place] Telephonic Care Manager Coordinator [facility] Direct Phone: [phone] Cell Phone: [phone] Toll Free: [phone] Fax: [phone]

## 2017-02-20 ENCOUNTER — Telehealth: Payer: Self-pay | Admitting: Internal Medicine

## 2017-02-20 ENCOUNTER — Ambulatory Visit (INDEPENDENT_AMBULATORY_CARE_PROVIDER_SITE_OTHER): Payer: Medicare Other | Admitting: Internal Medicine

## 2017-02-20 ENCOUNTER — Encounter: Payer: Self-pay | Admitting: Internal Medicine

## 2017-02-20 VITALS — BP 120/84 | HR 94 | Ht 69.0 in | Wt 135.0 lb

## 2017-02-20 DIAGNOSIS — I2781 Cor pulmonale (chronic): Secondary | ICD-10-CM | POA: Diagnosis not present

## 2017-02-20 DIAGNOSIS — J449 Chronic obstructive pulmonary disease, unspecified: Secondary | ICD-10-CM | POA: Diagnosis not present

## 2017-02-20 DIAGNOSIS — J9612 Chronic respiratory failure with hypercapnia: Secondary | ICD-10-CM | POA: Diagnosis not present

## 2017-02-20 DIAGNOSIS — J9611 Chronic respiratory failure with hypoxia: Secondary | ICD-10-CM

## 2017-02-20 MED ORDER — SPIRONOLACTONE 50 MG PO TABS: 50.0000 mg | ORAL_TABLET | Freq: Every day | ORAL | 2 refills | Status: DC

## 2017-02-20 MED ORDER — PANTOPRAZOLE SODIUM 40 MG PO TBEC
40.0000 mg | DELAYED_RELEASE_TABLET | Freq: Every day | ORAL | 2 refills | Status: DC
Start: 2017-02-20 — End: 2017-03-08

## 2017-02-20 NOTE — Assessment & Plan Note (Signed)
-   Start noct 02 at 2lpm 2008 and on 03/04/09 desat @ > 185 ft so rec wear with activtiy > rm to rm  - 02/15/2011   Walked RA x one lap @ 185 stopped due to  desat > corrected on 2lpm - 02/26/2014  Walked 2lpm  2 laps @ 185 ft each stopped due to  Sob/ sats 88% at nl pace  - 10/19/2014   Walked RA  2 laps @ 185 ft each stopped due to  End of study, slow pace,  sob  But no  desat   05/21/15 Eval by Dr Dedio/ rec trial of NIV  - HCO3  10/27/15 = 34  - HC03  10/06/16  = 28 ? On noct vent (non compliant)  As of admit  02/04/17 back on noct vent/ 02 2lpm 24/7

## 2017-02-20 NOTE — Assessment & Plan Note (Signed)
Worse leg swelling/ cxr s chf so likely all R heart failure > rec aldactone 50/ furosemid 20 qd and another dose of furosemide prn

## 2017-02-20 NOTE — Assessment & Plan Note (Addendum)
- PFTs  05/04/05 FEV1 36% ratio 28% with 29% improvement after bronchodilators DLCO 48%  - PFT's 03/26/08 30% ratio 34 with 14% improvement after bronchodilators DLC0 38 %  - Referred to rehab 12/19/2012 > completed March 2015  - changed to spiriva respimat 02/14/2013  - started daily prednisone 06/19/13 > improved only a little> tapered off mid July 2015 > ok to change to prn prednisone 08/27/2013  - flared off nasonex and gerd rx 09/21/2014 > resumed - 10/19/2014  p extensive coaching HFA effectiveness =    90%  - changed pred to ceiling of 20 / floor of 5 mg daily as of 10/19/2014 > changed to floor of 10 mg daily 10/04/2015  - PFT's  06/08/2015  FEV1 0.58 (22 % ) ratio 27  p No sign % improvement from saba p symb/spiriva prior to study with DLCO  9/9 % corrects to 27 % for alv volume   - changed pred to ceiling of 20 and floor of 5 mg daily 06/13/16 > increased floor to 10 mg daily effective 02/20/2017  - referred back to rehab 09/13/2016  - try off noct vent and start titrating up xanax 01/11/2017 > no change but then admitted with flu and placed back on vent    DDX of  difficult airways management almost all start with A and  include Adherence, Ace Inhibitors, Acid Reflux, Active Sinus Disease, Alpha 1 Antitripsin deficiency, Anxiety masquerading as Airways dz,  ABPA,  Allergy(esp in young), Aspiration (esp in elderly), Adverse effects of meds,  Active smokers, A bunch of PE's (a small clot burden can't cause this syndrome unless there is already severe underlying pulm or vascular dz with poor reserve) plus two Bs  = Bronchiectasis and Beta blocker use..and one C= CHF  Adherence is always the initial "prime suspect" and is a multilayered concern that requires a "trust but verify" approach in every patient - starting with knowing how to use medications, especially inhalers, correctly, keeping up with refills and understanding the fundamental difference between maintenance and prns vs those medications only  taken for a very short course and then stopped and not refilled.  - - The proper method of use, as well as anticipated side effects, of a metered-dose inhaler are discussed and demonstrated to the patient. Improved effectiveness after extensive coaching during this visit to a level of approximately 90 % from a baseline of 75 % > continue hfa/smi - Each maintenance medication was reviewed in detail including most importantly the difference between maintenance and as needed and under what circumstances the prns are to be used. This was done in the context of a medication calendar review which provided the patient with a user-friendly unambiguous mechanism for medication administration and reconciliation and provides an action plan for all active problems. It is critical that this be shown to every doctor  for modification during the office visit if necessary so the patient can use it as a working document.     ? Acid (or non-acid) GERD > always difficult to exclude as up to 75% of pts in some series report no assoc GI/ Heartburn symptoms> rec max (24h)  acid suppression : change to pantoprazole 40 mg ac and zantac 150 mg hs   ? Allergy/asthma component >  The goal with a chronic steroid dependent illness is always arriving at the lowest effective dose that controls the disease/symptoms and not accepting a set "formula" which is based on statistics or guidelines that don't always take  into account patient  variability or the natural hx of the dz in every individual patient, which may well vary over time.  For now therefore I recommend the patient maintain  20 mg as ceiling and 10 mg as floor      ? Alpha one AT def > not checked in Epic > check next ov    ? Anxiety/depression > usually at the bottom of this list of usual suspects but should be much higher on this pt's based on H and P and note already on psychotropics .   ? Active sinus dz ?> reviewed afrin prior to nasal steroids strategy   ?  Bronchiectasis component >  Continue flutter valve as much as possible   ? chf > most likely cor pulmoale so rec change to  aldactone  50 mg each am/ lasix 20 mg each am and extra lasix prn    I had an extended discussion with the patient/wife  reviewing all relevant studies completed to date and  lasting 25 minutes of a 40  minute acute office visit     re  severe non-specific but potentially very serious refractory respiratory symptoms of uncertain and potentially multiple  etiologies.   Each maintenance medication was reviewed in detail including most importantly the difference between maintenance and prns and under what circumstances the prns are to be triggered using an action plan format that is not reflected in the computer generated alphabetically organized AVS.    Please see AVS for specific instructions unique to this office visit that I personally wrote and verbalized to the the pt in detail and then reviewed with pt  by my nurse highlighting any changes in therapy/plan of care  recommended at today's visit.

## 2017-02-20 NOTE — Telephone Encounter (Signed)
Patient is scheduled with TP on 2.21.19 nothing further needed.

## 2017-02-20 NOTE — Patient Instructions (Addendum)
Always chase the afrin with either nasonex or flonase  Change furosemide to where you  20 mg one daily automatically and then one extra as needed for leg swelling   Add aldactone (spironolactone) 50 mg one daily as per med calendar  Plan A = Automatic = symbicort 160 Take 2 puffs first thing in am and then another 2 puffs about 12 hours later and spiriva 2 pffs each am   Plan B = Backup Only use your levoalbuterol as a rescue medication to be used if you can't catch your breath by resting or doing a relaxed purse lip breathing pattern.  - The less you use it, the better it will work when you need it. - Ok to use the inhaler up to 2 puffs  every 4 hours if you must but call for appointment if use goes up over your usual need - Don't leave home without it !!  (think of it like the spare tire for your car)   Plan C = Crisis - only use your levoalbuterol nebulizer if you first try Plan B and it fails to help > ok to use the nebulizer up to every 4 hours but if start needing it regularly call for immediate appointment   Plan D = Deltasone 10 mg double it until better then one daily thereafter   Plan E = ER - go to ER or call 911 if all else fails    Change next appt to see tammy with all meds in hand  Add:   Check alpha one phenotype/ bmet next ov

## 2017-02-20 NOTE — Telephone Encounter (Signed)
Called patient, unable to reach LMTCB 

## 2017-02-20 NOTE — Progress Notes (Signed)
Subjective:    Patient ID: [name], male    DOB: 1929/05/02   MRN: [id number]     Summary: Pulmonary/ f/u ov try astepro for pnds   Primary Provider/Referring Provider: [name]    Brief patient profile:  75  yowm last smoked 1983 with GOLD III COPD with some reversibility and 02 dep at hs and with activity    History of Present Illness   10/19/2014  f/u ov/[name] re: GOLD III / maint rx symbicort/ spiriva Chief Complaint  Patient presents with  . Follow-up    Pt c/o fatigue and increased chest congestion for the past several wks. He has am cough with white sputum. He is using xopenex 2 x per day on average.    on 10/8 started pred 20 and zpak as per med calendar action plan  gxt flat x 5mph x 15 min and zero grade on 2lpm with sats mid 90s at home  >decrease pred 5mg    11/19/2014 NP Follow up : COPD GOLD III  Returns for follow up .  Says he had a flare 4 days ago with increased SOB, occasional dry cough, chest tightness. Sinus pressure and drainage. Denies fever, wheezing, nausea or vomiting. Started on Zpack and prednisone 20mg  yesterday .  Feels better   rec Follow med calendar closely and bring to each visit.  Finish Zpack as directed.  Taper prednisone and hold at 5mg  daily  Please contact office for sooner follow up if symptoms do not improve or worsen or seek emergency care        04/15/2015  f/u ov/[name] re: GOLD III/ hypercarbic/ maint rx symb and spiriva and pred floor of 10 mg and 02 lpm 24/7  Chief Complaint  Patient presents with  . Follow-up    Pt c/o increased nasal congestion and SOB x 2 wks. He is coughing more and producing minmal clear to white sputum.  He is using xopenex inhaler 2-3 x per day.    Wakes up feeling really lousy, mild ha and sluggish despite neg osa eval in past Not using flutter and quite congested in am as well  Doe = MMRC3 = can't walk 100 yards even at a slow pace at a flat grade s stopping due to sob  Even on 02/ not using  treadmill any more  >>cont current regimen     05/21/15 Eval by Dr Dedio/ rec trial of NIV         Admit date: 09/24/2015 Discharge date: 09/26/2015 Primary Cardiologist:Dr. Tamala Julian   Discharge Diagnoses:  Principal Problem:   Angina at rest [facility]) Active Problems:   Essential hypertension   COPD GOLD IV/ 02 dep /steroid dep   Chronic respiratory failure with hypoxia and hypercapnia (HCC)   CAD (coronary artery disease)   Chest pain   Orthostatic hypotension not corrected on prednisone 20 mg daily    10/04/2015  Post hosp f/u ov/[name] re:  Prednisone 20 mg daily  Plus symb 160 2bid/ spiriva respimat / flutter valve/ has still not started astral noct vent / 02 2lpm 24/7  Chief Complaint  Patient presents with  . Follow-up    Pt. wants to discuss being on Tiotropium and Pulmicort, breathing has not been better,been in the hospital twice, some coughing in the morning   main complaint is feeling lousy when stands up, not sob/ while  maintaining per cards  on pred 20 mg daily while still on multiple bp meds for which he is getting confused in  terms of how to use.  No improvement in orthostasis on 20 mg per day (looks like also tried florinef prior to the being maint on higher "floor" prednisone dose.  Was waking up feeling flushed with high BP but apparently never happened on NIV as never used it "too much else goingtlined above  rec Continue prednisone 20mg  daily until breathing better then 10 mg daily as per your med calendar  Put the new blood pressure medicines in pencil on your med calendar Go ahead and use the Astral as much as you can until see Dr Murlean Iba        04/17/2016  f/u ov/[name] re:   COPD IV/ 02 dep /  maint  on symb/spiriva  Chief Complaint  Patient presents with  . Follow-up    COPD, more mucus production in his nasal passenges, hard to get air through the cannula, constantly out of breathe, he cant tell if the nebulizer medications ae helping him   still exercising  daily x 10 min s stopping at 17mph flat but can't walk at [place] "I guess I walk too fast" sats are tu[oca;;u pl pm 2 lpm  Lots of nasal congestion, no using afrin or increase nasonex per action plan  rec Use afrin/ flonase to control rhinitis    06/13/2016  f/u ov/[name] re:   COPD GOLD IV/ 02 dep/ maint symb/ spiriva/ 02 2lpm and NIV / trelegy per [place] Chief Complaint  Patient presents with  . Follow-up    Pt c/o feeling groggy and tired all of the time. He states having alot of nasal and sinus congestion.    walking on 2lpm on treadmill continuous flow x up to 10  min x 1 mph x no grade  Using afrin x 3 days chased but only using hs nasonex No correlation with daytime grogginess and time of day or whether uses NIV or not/ no am ha either way  rec As per instructions > prednisone is 20 mg daily until better then taper down to 5 mg daily  Change the afrin to where you take it 2 min before the nasonex but use it twice daily     09/13/2016  f/u ov/[name] re:  COPD GOLD IV/ 02 dep 2lpm 24/7 sym/spiriva and pred 5 mg daily  Chief Complaint  Patient presents with  . Follow-up    Down to pred 5 mg daily. He states he breathing "may be a little worse". He is using xopenex inhaler 3 x daily on average and he has not needed neb.    worse swelling legs x one month only on lasix 20 mg qod  L > R (instructions say 20 mg daily prn)  Sleeping ok off vent s groggy in am  Walking 10 min at 1 mph on 2lpm no grade most days  rec No change rx    11/07/2016 acute extended ov/[name] re: COPD GOLD IV/ 02  Chief Complaint  Patient presents with  . Acute Visit    Pt is having SOB with exertion, not feeling well. Pt is feeling cold all the time, possible chills, he is more tired than normal. Pt not breathing as well as normal, symptoms has worsened.  worse x 3 weeks prior to OV  When power went off things went downhill and never improved No vent x 4 days no change in am loc or daytime doe on vs off vent Sob  x 10 ft now Not using med cal action plan correctly/ never tried  doubling the pred dose as prev instructed for worse breathing but didn't use the saba correctly either and  hfa now poor. rec Plan A = Automatic = symbicort 160 x2 pffs and spiriva 2 pffus first thing in am then 12 hours later take the symb 160  Work on inhaler technique:  relax and gently blow all the way out then take a nice smooth deep breath back in, triggering the inhaler at same time you start breathing in.  Hold for up to 5 seconds if you can. Blow out thru nose. Rinse and gargle with water when done Plan B = Backup Only use your albuterol as a rescue medication  Plan C = Crisis - only use your albuterol nebulizer if you first try Plan B and it fails to help > ok to use the nebulizer up to every 4 hours but if start needing it regularly call for immediate appointment Plan D = Deltasone = prednisone > take 2 x 10 mg pill daily until  Breathing  bettern then 1 daily thereafter = 10 mg daily     01/11/2017  f/u ov/Arvella Massingale re:  Chief Complaint  Patient presents with  . Acute Visit    Pt states that his breathing is worse and the slightest movement is making him more SOB. Pt is using his flutter valve which is helping to get mucus out. Also has complaints of CP.  DME: [facility], 2L pulse   on pred 20 mg per day x one week  02 2lpm 24/7 and with walking on gxt x 5 min still low 90's  Stopped rehab 4 weeks prior to OV  After "hit a wall" > labs ok per pt  rec Stop the nocturnal ventilator We will make sure your 02 is humidified 02 Change xanax to .25 mg on whole pill  three times a day as per the med calendar   02/20/2017 acute extended ov/Norm Wray re: transition of care/ re-establish re copd/ chronic resp failure/cor pulmonale Chief Complaint  Patient presents with  . Acute Visit    swelling in his legs has been worse since his last hospital stay.    doe = MMRC4  = sob if tries to leave home or while getting dressed   Congested  rattling cough not using flutter valve much//mucus dark > zpak turning white  xopenex use up so double prednisone no better sob so now on 30 mg daily vs floor of pred 10 g daily  Says sleeps better on trelegy but still wakes up feeling lousy   No obvious day to day or daytime variability or assoc ongoing  purulent sputum or mucus plugs or hemoptysis or cp or chest tightness, subjective wheeze or overt sinus or hb symptoms. No unusual exposure hx or h/o childhood pna/ asthma or knowledge of premature birth.  Sleeping ok on Vent/ 02 2lpm  without nocturnal  or early am exacerbation  of respiratory  c/o's or need for noct saba. Also denies any obvious fluctuation of symptoms with weather or environmental changes or other aggravating or alleviating factors except as outlined above   Current Allergies, Complete Past Medical History, Past Surgical History, Family History, and Social History were reviewed in [employer] record.  ROS  The following are not active complaints unless bolded Hoarseness, sore throat, dysphagia, dental problems, itching, sneezing,  nasal congestion or discharge of excess mucus or purulent secretions, ear ache,   fever, chills, sweats, unintended wt loss or wt gain, classically pleuritic or exertional  cp,  orthopnea pnd or leg swelling, presyncope, palpitations, abdominal pain, anorexia, nausea, vomiting, diarrhea  or change in bowel habits or change in bladder habits, change in stools or change in urine, dysuria, hematuria,  rash, arthralgias, visual complaints, headache, numbness, weakness or ataxia or problems with walking or coordination,  change in mood/affect or memory.        Current Meds  Medication Sig  . ALPRAZolam (XANAX) 0.25 MG tablet Take 1 tablet (0.25 mg total) by mouth 3 (three) times daily. Takes 1/2 tablet two times daily.  Marland Kitchen aspirin 81 MG tablet Take 81 mg by mouth daily.  Marland Kitchen atorvastatin (LIPITOR) 20 MG tablet TAKE 1 TABLET BY MOUTH  EVERY DAY  . azithromycin (ZITHROMAX) 250 MG tablet 2 pills on day 1, then 1 pill daily for next 4 days.  Marland Kitchen b complex vitamins tablet Take 1 tablet by mouth daily.  . Cholecalciferol (VITAMIN D3) 2000 units TABS Take 2,000 Units by mouth daily.  . Cyanocobalamin (VITAMIN B 12 PO) Take 1 tablet daily by mouth.   . finasteride (PROSCAR) 5 MG tablet Take 5 mg by mouth every morning.  . furosemide (LASIX) 20 MG tablet Take 20 mg by mouth daily.  Marland Kitchen guaiFENesin (MUCINEX) 600 MG 12 hr tablet Take by mouth 2 (two) times daily as needed.  . mometasone (NASONEX) 50 MCG/ACT nasal spray SHAKE LIQUID AND USE 2 SPRAYS IN EACH NOSTRIL DAILY  . Multiple Vitamins-Minerals (PRESERVISION AREDS 2) CAPS Take 1 capsule by mouth daily.  Marland Kitchen NITROSTAT 0.4 MG SL tablet DISSOLVE 1 TABLET UNDER THE TONGUE AS DIRECTED AND AS NEEDED FOR CHEST PAIN  . OXYGEN Inhale 2 L continuous into the lungs.   . predniSONE (DELTASONE) 10 MG tablet Take 4 tabs daily for 3 days, then 3 tabs daily for 3 days, then 2 tabs daily for 3 days, then continue prior dose of 1 tab daily which is your prior maintenance dose.  . ranitidine (ZANTAC) 150 MG tablet Take 150 mg by mouth 2 (two) times daily.  . Sodium Chloride, Hypertonic, (MURO 128 OP) Place 1 drop 4 (four) times daily into both eyes.  Marland Kitchen SPIRIVA RESPIMAT 2.5 MCG/ACT AERS INHALE 2 PUFFS BY MOUTH EVERY DAY  . SYMBICORT 160-4.5 MCG/ACT inhaler INHALE 2 PUFFS BY MOUTH EVERY MORNING AND THEN ANOTHER 2 PUFFS ABOUT 12 HOURS LATER  . TOPROL XL 25 MG 24 hr tablet TAKE 1 TABLET BY MOUTH DAILY  . XOPENEX HFA 45 MCG/ACT inhaler INHALE 2 PUFFS BY MOUTH EVERY 4 HOURS AS NEEDED FOR WHEEZING OR SHORTNESS OF BREATH                                                                                                                                                    Past Medical History:  CHRONIC RHINITIS (ICD-472.0)  - Sinus ct March 03, 2010 > thickening only,  no acute changes  CHRONIC RESPIRATORY FAILURE (ICD-518.83)  - 02 rx since 2008  COPD UNSPECIFIED (ICD-496)....................................................................[name]  - PFTs 05/04/05 FEV1 36% ratio 28% with 29% improvement after bronchodilators DLCO 48%  - PFT's 03/26/08 30% ratio 34 with 14% improvement after bronchodilators DLC0 38 %  - Start noct 02 at 2lpm 2008 and on 03/04/09 desat @ > 185 ft so rec wear with activtiy > rm to rm  - [facility] June 30, 2008 100% > confirmed March 04, 2009  ISCHEMIC HEART DISEASE (ICD-414.9)  HYPERTENSION (ICD-401.9)  HYPERLIPIDEMIA (ICD-272.4)  SPN RUL by cxr August 19, 2009  COMPLEX MED REGIMEN-Meds reviewed with pt education and computerized med calendar 11/22/09 03/10/2011  , 10/15/2012  07/17/2013 , 12/09/2013 , 06/15/2014 , 11/19/14 , 5.11.17          Objective:   Physical Exam  Chronically ill amb wm rattling cough hopeless helpless affect   Vital signs reviewed - Note on arrival 02 sats  95% on 2lpm pulsed       wt 159 04/28/2010  > 10/14/2010 154  > 10/04/2011 150> 12/21/2011  150 > 149 01/29/2012 >154 04/05/2012 > 05/22/2012 148 > 09/30/2012  146 >>144> 10/15/2012 > 12/18/2012  145 > 148 02/14/2013 >148 03/14/2013 > 04/25/2013 148 > 06/19/2013 150 >>07/17/2013 > 08/27/2013 148 > 10/23/2013  148 >153 12/09/2013 > 02/26/2014  153 > 06/01/2014  152 > 157 06/15/2014 > 09/21/2014 146 > 10/19/2014 151 >>148 11/19/14 > 02/24/2015 148 > 06/30/2015    140 >  10/04/2015  136 > 12/20/2015   142 > 06/13/2016  132 > 09/13/2016   133 >  11/07/2016  139 > 01/11/2017   135 > 02/20/2017  135    HEENT: nl dentition, turbinates bilaterally, and oropharynx. Nl external ear canals without cough reflex   NECK :  without JVD/Nodes/TM/ nl carotid upstrokes bilaterally   LUNGS: no acc muscle use, barrel  contour chest with junky mostly upper airway rhonchi no true wheeze and hyper resonant bilaterally to percussion   CV:  RRR  no s3 or murmur or increase in P2, and  2+ pitting  sym both  lower ext despite elastic hose   ABD:  soft and nontender with nl inspiratory excursion in the supine position. No bruits or organomegaly appreciated, bowel sounds nl  MS:  Nl gait/ ext warm without deformities, calf tenderness, cyanosis or clubbing No obvious joint restrictions   SKIN: warm and dry without lesions    NEURO:  alert, approp, nl sensorium with  no motor or cerebellar deficits apparent.          CXR PA and Lateral:   02/20/2017 :    I personally reviewed images and agree with radiology impression as follows:    No acute abnormality.

## 2017-02-20 NOTE — Telephone Encounter (Signed)
Left message for Roxanne, RN from [place] at phone [phone]

## 2017-02-21 ENCOUNTER — Telehealth: Payer: Self-pay | Admitting: Pharmacist

## 2017-02-21 NOTE — Telephone Encounter (Signed)
lmtcb x1 for [employer]

## 2017-02-21 NOTE — Telephone Encounter (Signed)
Called and spoke with Roxanne regarding instructions on AVS regarding furosemide and spironolactone.  Roxanne expressed understanding and stated to me she would call pt and verify the information with him.  Stated to Roxanne to call us if they had anymore questions.  Nothing further needed at this current time.

## 2017-02-21 NOTE — Telephone Encounter (Signed)
[name] returning call.

## 2017-02-21 NOTE — Patient Outreach (Signed)
[facility]) Care Management  [place]   02/21/2017  [name] [birth year] [id number]  84 y.o. year old male referred to [place] for Medication Management (Pharmacy telephone outreach)  PMH s/f: severe COPD (GOLD III vs IV) with some reversibility and steroid dependent, oxygen dependent 2 L/min and on nocturnal ventilation, CAD status post CABG, BPH, HTN, GERD, peripheral vascular disease, orthostatic hypotension, anxiety, LVEF 50%. Noted that patient was recently hospitalized for acute respiratory decompensation in setting of influenza A. Patient was treated with antibiotics and steroids were increased. Since discharge, he experienced worsening of sx and was again treated with antibiotics (zpak) and prednisone 20 mg daily.     It is noted that patient demonstrated poor inhaler technique and was found to be noncompliant with "med cal action plan" and previous instructions to double prednisone dose in pulmonology office visit yesterday. Yesterday, patient was instructed to finish zpak, taper prednisone to 5 mg daily, start spironolactone 50 mg daily, and increase lasix to 20 mg daily + extra 20 mg tab if edematous.   Patient with [place] medicare advantage plan.    Patient confirms identity with HIPAA-identifiers x2 and gives verbal consent to speak over the phone about medications. Patient does however deny need for pharmacy services from [facility] After discussion about benefits of multidisciplinary approach to medication management, he continues to deny offer for home visit or over-the-phone comprehensive pharmacotherapy assessment.   Subjective: Reports he has not started spironolactone yet but has the prescription filled. Inquires about safety of taking two diuretics together. After discussion, patient agreeable to taking spironolactone. Reports "no issues remembering to take" oral or inhaled medications. States that he has a pill box and phone app for adherence.  Denies any issues with affordability. Denies comprehensive medication review despite multiple offers. Reports that Dr. Oran Rein took ECHO more recently than 2016 and told him his heart function was "not much different". Patient unaware of objective data.   Objective:  02/05/17 labs: K 4.1, Scr 0.89 stable Last EF in chart : 50% (2016) 02/05/2016 eosinophils: 0   Encounter Medications: Outpatient Encounter Medications as of 02/21/2017  Medication Sig  . ALPRAZolam (XANAX) 0.25 MG tablet Take 1 tablet (0.25 mg total) by mouth 3 (three) times daily. Takes 1/2 tablet two times daily.  Marland Kitchen aspirin 81 MG tablet Take 81 mg by mouth daily.  Marland Kitchen atorvastatin (LIPITOR) 20 MG tablet TAKE 1 TABLET BY MOUTH EVERY DAY  . azithromycin (ZITHROMAX) 250 MG tablet 2 pills on day 1, then 1 pill daily for next 4 days.  Marland Kitchen b complex vitamins tablet Take 1 tablet by mouth daily.  . Cholecalciferol (VITAMIN D3) 2000 units TABS Take 2,000 Units by mouth daily.  . Cyanocobalamin (VITAMIN B 12 PO) Take 1 tablet daily by mouth.   . finasteride (PROSCAR) 5 MG tablet Take 5 mg by mouth every morning.  . furosemide (LASIX) 20 MG tablet Take 20 mg by mouth daily.  Marland Kitchen guaiFENesin (MUCINEX) 600 MG 12 hr tablet Take by mouth 2 (two) times daily as needed.  . mometasone (NASONEX) 50 MCG/ACT nasal spray SHAKE LIQUID AND USE 2 SPRAYS IN EACH NOSTRIL DAILY  . Multiple Vitamins-Minerals (PRESERVISION AREDS 2) CAPS Take 1 capsule by mouth daily.  Marland Kitchen NITROSTAT 0.4 MG SL tablet DISSOLVE 1 TABLET UNDER THE TONGUE AS DIRECTED AND AS NEEDED FOR CHEST PAIN  . OXYGEN Inhale 2 L continuous into the lungs.   . pantoprazole (PROTONIX) 40 MG tablet Take 1 tablet (40  mg total) by mouth daily. Take 30-60 min before first meal of the day  . predniSONE (DELTASONE) 10 MG tablet Take 4 tabs daily for 3 days, then 3 tabs daily for 3 days, then 2 tabs daily for 3 days, then continue prior dose of 1 tab daily which is your prior maintenance dose.  . ranitidine  (ZANTAC) 150 MG tablet Take 150 mg by mouth 2 (two) times daily.  . Sodium Chloride, Hypertonic, (MURO 128 OP) Place 1 drop 4 (four) times daily into both eyes.  Marland Kitchen SPIRIVA RESPIMAT 2.5 MCG/ACT AERS INHALE 2 PUFFS BY MOUTH EVERY DAY  . spironolactone (ALDACTONE) 50 MG tablet Take 1 tablet (50 mg total) by mouth daily.  . SYMBICORT 160-4.5 MCG/ACT inhaler INHALE 2 PUFFS BY MOUTH EVERY MORNING AND THEN ANOTHER 2 PUFFS ABOUT 12 HOURS LATER  . TOPROL XL 25 MG 24 hr tablet TAKE 1 TABLET BY MOUTH DAILY  . XOPENEX HFA 45 MCG/ACT inhaler INHALE 2 PUFFS BY MOUTH EVERY 4 HOURS AS NEEDED FOR WHEEZING OR SHORTNESS OF BREATH   No facility-administered encounter medications on file as of 02/21/2017.     Functional Status: In your present state of health, do you have any difficulty performing the following activities: 02/04/2017  Hearing? Y  Vision? Y  Difficulty concentrating or making decisions? N  Walking or climbing stairs? N  Dressing or bathing? N  Doing errands, shopping? N  Some recent data might be hidden    Fall/Depression Screening: Fall Risk  10/02/2016 10/06/2015 12/26/2012  Falls in the past year? No Yes No  Number falls in past yr: - 2 or more -  Injury with Fall? - Yes -  Risk Factor Category  - High Fall Risk -  Risk for fall due to : - Impaired balance/gait;Impaired mobility;History of fall(s) Impaired balance/gait;Medication side effect  Follow up - Education provided;Falls prevention discussed -   PHQ 2/9 Scores 10/02/2016 10/06/2015 12/26/2012  PHQ - 2 Score 4 1 2   PHQ- 9 Score 11 - 5    Assessment:  Unable to do comprehensive medication assessment due to patient's polite refusal. From med list currently in Epic, patient appears to be on guideline-directed medication therapy for COPD with LABA/LAMA/ICS. May be candidate for chronic macrolide therapy (azithromycin 250 mg daily x 1 year) given smoking history and frequency of exacerbations.  No issues with affordability at this  time. Denies medication adherence assessment.  Plan:  -Marked "taking" on medications that patient mentioned over the phone -Counseled on MOA of spironolactone and assured patient that medications are all safe in combination -Encouraged adherence to medications -Offered home visit to review all medications, take vitals, reinforce inhaler technique - pt declines -Offered comprehensive medication review over the phone - patient declines  -Will route this note and send inbasket message to Dr. [name] and Eric Form, NP to suggest chronic azithromycin therapy in a patient phasing in palliative care (02/14/2017 note) to help reduce risk of COPD exacerbation -Pharmacy signing off, happy to help in future if needed  [name], Pharm.D., BCPS PGY2 Ambulatory Care Pharmacy Resident Phone: [phone]

## 2017-02-21 NOTE — Telephone Encounter (Signed)
Roxanne returned call, CB [phone].  Asked to call and leave a message with any order changes and advise if we have contacted the patient directly.

## 2017-02-21 NOTE — Telephone Encounter (Signed)
Left a message for Roxanne to return call.   Always chase the afrin with either nasonex or flonase  Change furosemide to where you  20 mg one daily automatically and then one extra as needed for leg swelling   Add aldactone (spironolactone) 50 mg one daily as per med calendar  Plan A = Automatic = symbicort 160 Take 2 puffs first thing in am and then another 2 puffs about 12 hours later and spiriva 2 pffs each am   Plan B = Backup Only use your levoalbuterol as a rescue medication to be used if you can't catch your breath by resting or doing a relaxed purse lip breathing pattern.  - The less you use it, the better it will work when you need it. - Ok to use the inhaler up to 2 puffs  every 4 hours if you must but call for appointment if use goes up over your usual need - Don't leave home without it !!  (think of it like the spare tire for your car)   Plan C = Crisis - only use your levoalbuterol nebulizer if you first try Plan B and it fails to help > ok to use the nebulizer up to every 4 hours but if start needing it regularly call for immediate appointment

## 2017-02-22 ENCOUNTER — Ambulatory Visit: Payer: Self-pay | Admitting: *Deleted

## 2017-02-22 DIAGNOSIS — J9612 Chronic respiratory failure with hypercapnia: Secondary | ICD-10-CM | POA: Diagnosis not present

## 2017-02-22 DIAGNOSIS — J9611 Chronic respiratory failure with hypoxia: Secondary | ICD-10-CM | POA: Diagnosis not present

## 2017-02-22 DIAGNOSIS — J441 Chronic obstructive pulmonary disease with (acute) exacerbation: Secondary | ICD-10-CM | POA: Diagnosis not present

## 2017-02-22 DIAGNOSIS — I10 Essential (primary) hypertension: Secondary | ICD-10-CM | POA: Diagnosis not present

## 2017-02-22 DIAGNOSIS — J09X2 Influenza due to identified novel influenza A virus with other respiratory manifestations: Secondary | ICD-10-CM | POA: Diagnosis not present

## 2017-02-22 DIAGNOSIS — I2510 Atherosclerotic heart disease of native coronary artery without angina pectoris: Secondary | ICD-10-CM | POA: Diagnosis not present

## 2017-02-23 ENCOUNTER — Other Ambulatory Visit: Payer: Self-pay | Admitting: *Deleted

## 2017-02-23 NOTE — Patient Outreach (Signed)
[facility]) Care Management  02/23/2017  CONALL VANGORDER 30-Jan-1929 [id number]   EMMI- COPD RED ON EMMI ALERT DAY#: 1 DATE: 02/13/17 RED ALERT: Feeling worse overall? Yes More chest pain than yesterday? Yes Mucus change color? Yes # of times rescue inhaler used in past 24 hours? 4   Outreach attempt #1 to patient. Patient requested a return phone call at a later time. Patient was unavailable. He was just getting out of bed.  Plan: RN CM will contact patient within one week.    Lake Bells, RN, BSN, MHA/MSL, [place] Telephonic Care Manager Coordinator [facility] Direct Phone: [phone] Toll Free: [phone] Fax: [phone]

## 2017-02-26 ENCOUNTER — Other Ambulatory Visit: Payer: Self-pay | Admitting: *Deleted

## 2017-02-26 ENCOUNTER — Ambulatory Visit: Payer: Self-pay | Admitting: *Deleted

## 2017-02-26 NOTE — Patient Outreach (Signed)
[facility]) Care Management  02/26/2017  [name] 02-11-1929 [id number]  EMMI- COPD RED ON EMMI ALERT DAY#: 1 DATE: 02/13/17 RED ALERT: Feeling worse overall? Yes More chest pain than yesterday? Yes Mucus change color? Yes # of times rescue inhaler used in past 24 hours? 4  Outgoing telephone call to patient. HIPAA identifiers x's 2 verified. Patient reported, "I absolutely feel terrible". He continues to have copious amounts of secretions. He spoke about having congestion in his nasal area, various places, and having sinus complications. Patient verbalized feeling groggy and something about being extracted from his chair. He felt as if he was getting better a couple of weeks ago, however things have changed, per patient. Patient stated every medication the doctor has prescribed doesn't seem to work. Patient reported being prescribed Afrin for 3 days. He continued to have congestion and his doctor told him to use Afrin for 2 more days. Patient believes he is beginning to experience rebound congestion due to taking the Afrin longer than recommended. He spoke about a using cool mist vaporizer, which decreases him symptoms about "5. 5 minutes after he uses the vaporizer". Patient stated, he has tried everything. He is using oxygen continuous and a Trelegy machine at night.   Patient was recently in the hospital diagnosed with the Influenza A. Per EMR, patient was diagnosed with Influenza A, acute bronchitis with COPD exacerbation/end stage COPD.   Patient asked about an Allergist. RN CM plans to assist patient with researching Allergist within the area. Patient stated, he is prescribed Furosemide. He discussed having an allergy to Sulfa. Patient stated, he read somewhere that if you have an allergy to Sulfa; don't take Furosemide. RN CM advised patient to speak with [facility] Pharmacist about his question/concerns. Patient stated he had some paperwork from [employer] and he  hasn't had the opportunity to read the paperwork. Patient stated, he thought [facility] was a part of [place]. [facility] services and benefits explained to patient. Patient stated, he was busy in the beginning, however his spouse convinced patient to allow [facility] to make a home visit.   Plan: RN CM advised patient to contact RNCM for any needs or concerns. RN CM advised patient to alert MD for any changes in conditions.  RN CM will send referral to [facility] RN for further assessment of care needs and management of chronic conditions. RN CM plans to send patient information about Allergist   Lake Bells, RN, BSN, MHA/MSL, [place] Direct Phone: [phone] Cell Phone: [phone] Toll Free: [phone] Fax: [phone]

## 2017-02-27 ENCOUNTER — Telehealth: Payer: Self-pay | Admitting: Internal Medicine

## 2017-02-27 DIAGNOSIS — J09X2 Influenza due to identified novel influenza A virus with other respiratory manifestations: Secondary | ICD-10-CM | POA: Diagnosis not present

## 2017-02-27 DIAGNOSIS — J9612 Chronic respiratory failure with hypercapnia: Secondary | ICD-10-CM | POA: Diagnosis not present

## 2017-02-27 DIAGNOSIS — J441 Chronic obstructive pulmonary disease with (acute) exacerbation: Secondary | ICD-10-CM | POA: Diagnosis not present

## 2017-02-27 DIAGNOSIS — J9611 Chronic respiratory failure with hypoxia: Secondary | ICD-10-CM | POA: Diagnosis not present

## 2017-02-27 DIAGNOSIS — I10 Essential (primary) hypertension: Secondary | ICD-10-CM | POA: Diagnosis not present

## 2017-02-27 DIAGNOSIS — I2510 Atherosclerotic heart disease of native coronary artery without angina pectoris: Secondary | ICD-10-CM | POA: Diagnosis not present

## 2017-02-27 MED ORDER — FUROSEMIDE 20 MG PO TABS: 20.0000 mg | ORAL_TABLET | Freq: Every day | ORAL | 1 refills | Status: DC

## 2017-02-27 NOTE — Telephone Encounter (Signed)
Sent refill into pharmacy, per MW patient is to take one pill daily and one extra if needed for leg swelling.

## 2017-02-28 ENCOUNTER — Ambulatory Visit (HOSPITAL_COMMUNITY)
Admission: RE | Admit: 2017-02-28 | Discharge: 2017-02-28 | Disposition: A | Discharge status: Home / Self Care | Payer: Medicare Other | Source: Ambulatory Visit | Attending: Family | Admitting: Family

## 2017-02-28 ENCOUNTER — Ambulatory Visit (INDEPENDENT_AMBULATORY_CARE_PROVIDER_SITE_OTHER)
Admission: RE | Admit: 2017-02-28 | Discharge: 2017-02-28 | Disposition: A | Discharge status: Home / Self Care | Payer: Medicare Other | Source: Ambulatory Visit | Attending: Family | Admitting: Family

## 2017-02-28 ENCOUNTER — Other Ambulatory Visit: Payer: Self-pay | Admitting: *Deleted

## 2017-02-28 ENCOUNTER — Ambulatory Visit: Payer: Medicare Other | Admitting: Internal Medicine

## 2017-02-28 DIAGNOSIS — I779 Disorder of arteries and arterioles, unspecified: Secondary | ICD-10-CM | POA: Insufficient documentation

## 2017-02-28 DIAGNOSIS — I6523 Occlusion and stenosis of bilateral carotid arteries: Secondary | ICD-10-CM

## 2017-02-28 LAB — VAS US CAROTID
LEFT VERTEBRAL DIAS: 6 cm/s
Left CCA dist dias: 6 cm/s
Left CCA dist sys: 51 cm/s
Left CCA prox dias: 7 cm/s
Left CCA prox sys: 76 cm/s
Left ICA dist dias: -14 cm/s
Left ICA dist sys: -52 cm/s
Left ICA prox dias: 12 cm/s
Left ICA prox sys: 65 cm/s
RIGHT CCA MID DIAS: 9 cm/s
RIGHT VERTEBRAL DIAS: 6 cm/s
Right CCA prox dias: 10 cm/s
Right CCA prox sys: 80 cm/s
Right cca dist sys: -65 cm/s

## 2017-02-28 NOTE — Patient Outreach (Signed)
[facility]) Care Management  02/28/2017  GIL INGWERSEN [birth year] [id number]    Initial Outreach   RN spoke with pt briefly and introduced [facility] however pt on his way to a provider's appointment and requested a call back. Will attempt to call back over the next 1-2 days to inquire further on possible needs.  [name], RN Care Management Coordinator [place] Office [phone]

## 2017-03-01 ENCOUNTER — Other Ambulatory Visit (INDEPENDENT_AMBULATORY_CARE_PROVIDER_SITE_OTHER): Payer: Medicare Other

## 2017-03-01 ENCOUNTER — Other Ambulatory Visit: Payer: Self-pay | Admitting: Interventional Cardiology

## 2017-03-01 ENCOUNTER — Ambulatory Visit (INDEPENDENT_AMBULATORY_CARE_PROVIDER_SITE_OTHER): Payer: Medicare Other | Admitting: Adult Health

## 2017-03-01 ENCOUNTER — Encounter: Payer: Self-pay | Admitting: Adult Health

## 2017-03-01 ENCOUNTER — Other Ambulatory Visit: Payer: Self-pay | Admitting: *Deleted

## 2017-03-01 ENCOUNTER — Ambulatory Visit: Payer: Medicare Other | Admitting: Family

## 2017-03-01 ENCOUNTER — Encounter: Payer: Self-pay | Admitting: *Deleted

## 2017-03-01 VITALS — BP 122/78 | HR 98 | Ht 68.0 in | Wt 134.0 lb

## 2017-03-01 DIAGNOSIS — J9612 Chronic respiratory failure with hypercapnia: Secondary | ICD-10-CM | POA: Diagnosis not present

## 2017-03-01 DIAGNOSIS — J328 Other chronic sinusitis: Secondary | ICD-10-CM | POA: Diagnosis not present

## 2017-03-01 DIAGNOSIS — I779 Disorder of arteries and arterioles, unspecified: Secondary | ICD-10-CM | POA: Diagnosis not present

## 2017-03-01 DIAGNOSIS — J9611 Chronic respiratory failure with hypoxia: Secondary | ICD-10-CM | POA: Diagnosis not present

## 2017-03-01 DIAGNOSIS — J449 Chronic obstructive pulmonary disease, unspecified: Secondary | ICD-10-CM | POA: Diagnosis not present

## 2017-03-01 LAB — BASIC METABOLIC PANEL
BUN: 27 mg/dL — ABNORMAL HIGH (ref 6–23)
CO2: 34 mEq/L — ABNORMAL HIGH (ref 19–32)
Calcium: 9.6 mg/dL (ref 8.4–10.5)
Chloride: 97 mEq/L (ref 96–112)
Creatinine, Ser: 1.16 mg/dL (ref 0.40–1.50)
GFR: 63.51 mL/min (ref >60.00)
Glucose, Bld: 121 mg/dL — ABNORMAL HIGH (ref 70–99)
Potassium: 3.8 mEq/L (ref 3.5–5.1)
Sodium: 137 mEq/L (ref 135–145)

## 2017-03-01 MED ORDER — AZITHROMYCIN 250 MG PO TABS: ORAL_TABLET | ORAL | 0 refills | Status: AC

## 2017-03-01 NOTE — Assessment & Plan Note (Signed)
Continue on current regimen .   

## 2017-03-01 NOTE — Assessment & Plan Note (Signed)
Recurrent COPD exacerbation in patient with very severe COPD that is oxygen and steroid-dependent.  Patient is prone to frequent exacerbations.  Despite using Trilogy vent at bedtime for his chronic hypercarbia patient continues to have significant deconditioning and decreased activity tolerance. We will treat for an acute exacerbation with a Z-Pak today.  Could consider adding daily azithromycin however patient is on chronic steroids as well which may increase his risk for drug resistance going forward. His condition is complicated by diastolic heart failure and cor pulmonale.  We will try to add in Aldactone but at a lower dose along with his furosemide.  Labs including be met and alpha-1 testing is pending today  Plan  Patient Instructions  Zpack take as directed.  Mucinex DM Twice daily  As needed  Cough/congestion with flutter valve.  Taper prednisone to 43m daily as discussed.  Saline nasal rinses As needed   Continue on Furosemide 250mdaily .  Retry Aldactone but at 508m/2 daily .  Labs today .  Continue on Trilogy At bedtime.  May use Xopenex every 4hr as needed.  Follow up with Dr. WerMelvyn Novasn 3-4 weeks  and As needed   Please contact office for sooner follow up if symptoms do not improve or worsen or seek emergency care

## 2017-03-01 NOTE — Assessment & Plan Note (Signed)
Continue on oxygen to keep O2 saturations greater than 88-90%.  Continue on Trilogy vent At bedtime  .

## 2017-03-01 NOTE — Patient Instructions (Addendum)
Zpack take as directed.  Mucinex DM Twice daily  As needed  Cough/congestion with flutter valve.  Taper prednisone to 10mg  daily as discussed.  Saline nasal rinses As needed   Continue on Furosemide 20mg  daily .  Retry Aldactone but at 50mg  1/2 daily .  Labs today .  Continue on Trilogy At bedtime.  May use Xopenex every 4hr as needed.  Follow up with Dr. [name]  In 3-4 weeks  and As needed   Please contact office for sooner follow up if symptoms do not improve or worsen or seek emergency care

## 2017-03-01 NOTE — Progress Notes (Signed)
Patient called with results. I asked how his feet and legs are doing since bilateral TBI are 0.00, were better at his previous visit. He has not been walking much lately as he has been hospitalized with the flu. He has no discoloration of his toes or feet, no ulcers. He is taking prednisone for his breathing. His feet are more swollen.  We talked about feet elevation above his heart overnight and for 20 minutes 3-4x/day. This is a difficult position for him.  Since he has no rest pain and no signs of ischemia in his feet or legs, will schedule him to return in 3 months with ABI's and see Dr. Scot Dock (since I am not working on Wednesdays and that is when Dr. Scot Dock is usually in the office). I advised pt to notify our office if he develops rest pain or sores that don't heal in his feet or legs.   Schedulers: Please call pt to schedule him for ABI's and see Dr. Scot Dock in 3 months.   Dr. Scot Dock, If you want to see pt sooner or have different diagnostic studies for his legs, please let schedulers know.  Thank you, Vinnie Level

## 2017-03-01 NOTE — Patient Outreach (Signed)
[facility] [facility]) Care Management  03/01/2017  [name] 03/16/1929 [id number]    Transition of care D/C/ 02/09/2017 (Initial)  RN spoke with pt today and introduced the [facility] services and purpose for today's call. Pt receptive and was able to have this conversation. Pt states he is doing well and has had several appointments with his providers. Pt mentioned his primary. Pulmonologist and a FNP. Pt also confirms he is involved with [facility] for PT services twice weekly. States his only problem right now is his ongoing weakness and being very tired. States the therapy is helping "a lot". RN discussed some of his ongoing medical issues and inquired on pt's COPD. Pt states he uses his ongoing home O2 on liters and continues to have off and on some SOB which is expected during his activities. States his wife is very supportive and pt has permitted RN to speak with her in the future if he is not available concerning his ongoing his medical conditions. Reports some bilateral leg swelling but aware that this maybe due to his ongoing Prednisone therapy. RN encouraged pt to elevate throughout the day and if swelling continues to consult his provider. Pt states he has Lasix and permission to take an extra dose if needed. Pt also states he has orthostatic HTN  Since 2016 and is very careful using precautionary measures when this occurs. RN was able to completed the Transition of care template and further offered a home visit to possible address his needs further. Pt states he feels he is managing his medical conditions very well with support from his wife and opt to decline however appreciative but does not feel he needs direct home visits at this time. RN office to continue transition of care over the next several weeks and discussed a plan of care with goals and interventions related to hospital readmission prevention, post discharge medications and medical appointments adherence. Pt receptive  and agreed to this plan of care and RN scheduled the next call from next week.   Patient was recently discharged from hospital and all medications have been reviewed.  [name], RN Care Management Coordinator [place] Office [phone]

## 2017-03-01 NOTE — Progress Notes (Signed)
_0  ID: [name], male    DOB: 1929/09/06, 84 y.o.   MRN: [id number]  Chief Complaint  Patient presents with  . Follow-up    COPD     Referring provider: [name], MD  HPI: 84 yo male former smoker (1983) followed for GOLD III COPD w/ asthma component , O2 dependent RF with act and At bedtime    TEST /Events  PFTs 05/04/05 FEV1 36% ratio 28% with 29% improvement after bronchodilators DLCO 48%  - PFT's 03/26/08 30% ratio 34 with 14% improvement after bronchodilators DLC0 38 %  - Referred to rehab 12/19/2012 >completed March 2015  - changed to spiriva respimat 02/14/2013  - started daily prednisone 06/19/13 >improved only a little>tapered off mid July 2015 >ok to change to prn prednisone 08/27/2013  - flared off nasonex and gerd rx 09/21/2014 > resumed - 10/19/2014 p extensive coaching HFA effectiveness = 90%  - changed pred to ceiling of 20 / floor of 5 mg daily as of 10/19/2014 >changed to floor of 10 mg daily 10/04/2015  - PFT's 06/08/2015 FEV1 0.58 (22 % ) ratio 27 p No sign % improvement from saba p symb/spiriva prior to study with DLCO 9/9 % corrects to 27 % for alv volume  Start noct 02 at 2lpm 2008 and on 03/04/09 desat @ >185 ft so rec wear with activtiy >rm to rm  - 2/6/2013Walked RA x one lap @ 185 stopped due to desat >corrected on 2lpm - 02/26/2014 Walked 2lpm 2 laps @ 185 ft each stopped due to Sob/ sats 88% at nl pace  - 10/10/2016Walked RA 2 laps @ 185 ft each stopped due to End of study, slow pace, sob But no desat  - HCO3 10/27/15 = 34   03/01/2017 Follow up : COPD , O2RF  Patient returns for a week follow-up.  Patient was recently admitted earlier this month for influenza A with a COPD exacerbation.  Patient was seen last visit with suspected fluid overload.  He was started on Lasix daily and Aldactone daily.  His last visit patient is feeling only slightly better.  Says that cough is starting to increase with thick  yellow to green mucus.  Is been unable to decrease his prednisone back to baseline at 10 mg.  Patient only took Aldactone for a couple days said that it felt like that he made his dizziness worse.  He does feel that his leg swelling has gotten better on Lasix 20 mg daily. He remains on Symbicort and Spiriva.   He is on oxygen 2 L.  He uses trilogy vent at bedtime. PT is coming to home does feel that this is helping him some. His wife is with him today.  During last hospitalization palliative care was recommended for patient however he has declined. Denies any hemoptysis chest pain orthopnea PND     Allergies  Allergen Reactions  . Sulfonamide Derivatives Swelling    REACTION: facial/tongue swelling  . Cefdinir Diarrhea    REACTION: diarrhea  . Ciprofloxacin Itching and Rash    Red itchy hands  . Nitrofurantoin Swelling    Swollen hands  . Levaquin [Levofloxacin] Itching and Rash    Immunization History  Administered Date(s) Administered  . Influenza Split 10/10/2010, 10/04/2011, 10/14/2013  . Influenza Whole 10/01/2007, 11/06/2008, 11/09/2009  . Influenza, High Dose Seasonal PF 11/07/2016  . Influenza,inj,Quad PF,6+ Mos 09/30/2012, 10/19/2014  . Influenza-Unspecified 09/07/2015  . Pneumococcal Conjugate-13 08/27/2013  . Pneumococcal Polysaccharide-23 12/11/2001, 05/20/2015  Past Medical History:  Diagnosis Date  . Asthma   . BPH (benign prostatic hyperplasia)    severe; s/p multiple biopsies  . CAD (coronary artery disease)   . Carotid artery occlusion   . Chronic rhinitis   . COPD (chronic obstructive pulmonary disease) (HCC)    2L [place] O2  . Hyperlipidemia   . Hypertension   . On home oxygen therapy    "~ 24/7" (02/08/2017)  . Peripheral vascular disease ([place])   . Syncope and collapse   . Ureteral tumor 08/2015   had endoscopic procedure for evaluation, unable to reach for biopsy    Tobacco History: Social History   Tobacco Use  Smoking Status Former Smoker    . Packs/day: 2.00  . Years: 33.00  . Pack years: 66.00  . Types: Cigarettes  . Last attempt to quit: 01/09/1982  . Years since quitting: 35.1  Smokeless Tobacco Never Used   Counseling given: Not Answered   Outpatient Encounter Medications as of 03/01/2017  Medication Sig  . ALPRAZolam (XANAX) 0.25 MG tablet Take 1 tablet (0.25 mg total) by mouth 3 (three) times daily. Takes 1/2 tablet two times daily.  Marland Kitchen aspirin 81 MG tablet Take 81 mg by mouth daily.  Marland Kitchen atorvastatin (LIPITOR) 20 MG tablet TAKE 1 TABLET BY MOUTH EVERY DAY  . azithromycin (ZITHROMAX) 250 MG tablet 2 pills on day 1, then 1 pill daily for next 4 days.  Marland Kitchen b complex vitamins tablet Take 1 tablet by mouth daily.  . Cholecalciferol (VITAMIN D3) 2000 units TABS Take 2,000 Units by mouth daily.  . Cyanocobalamin (VITAMIN B 12 PO) Take 1 tablet daily by mouth.   . finasteride (PROSCAR) 5 MG tablet Take 5 mg by mouth every morning.  . furosemide (LASIX) 20 MG tablet Take 1 tablet (20 mg total) by mouth daily. Can take one additional pill if needed for swelling.  Marland Kitchen guaiFENesin (MUCINEX) 600 MG 12 hr tablet Take by mouth 2 (two) times daily as needed.  . mometasone (NASONEX) 50 MCG/ACT nasal spray SHAKE LIQUID AND USE 2 SPRAYS IN EACH NOSTRIL DAILY  . Multiple Vitamins-Minerals (PRESERVISION AREDS 2) CAPS Take 1 capsule by mouth daily.  Marland Kitchen NITROSTAT 0.4 MG SL tablet DISSOLVE 1 TABLET UNDER THE TONGUE AS DIRECTED AND AS NEEDED FOR CHEST PAIN  . OXYGEN Inhale 2 L continuous into the lungs.   . pantoprazole (PROTONIX) 40 MG tablet Take 1 tablet (40 mg total) by mouth daily. Take 30-60 min before first meal of the day  . predniSONE (DELTASONE) 10 MG tablet Take 4 tabs daily for 3 days, then 3 tabs daily for 3 days, then 2 tabs daily for 3 days, then continue prior dose of 1 tab daily which is your prior maintenance dose.  . ranitidine (ZANTAC) 150 MG tablet Take 150 mg by mouth 2 (two) times daily.  . Sodium Chloride, Hypertonic, (MURO  128 OP) Place 1 drop 4 (four) times daily into both eyes.  Marland Kitchen SPIRIVA RESPIMAT 2.5 MCG/ACT AERS INHALE 2 PUFFS BY MOUTH EVERY DAY  . spironolactone (ALDACTONE) 50 MG tablet Take 1 tablet (50 mg total) by mouth daily.  . SYMBICORT 160-4.5 MCG/ACT inhaler INHALE 2 PUFFS BY MOUTH EVERY MORNING AND THEN ANOTHER 2 PUFFS ABOUT 12 HOURS LATER  . TOPROL XL 25 MG 24 hr tablet TAKE 1 TABLET BY MOUTH DAILY  . XOPENEX HFA 45 MCG/ACT inhaler INHALE 2 PUFFS BY MOUTH EVERY 4 HOURS AS NEEDED FOR WHEEZING OR SHORTNESS OF BREATH  .  azithromycin (ZITHROMAX Z-PAK) 250 MG tablet Take 2 tablets (500 mg) on  Day 1,  followed by 1 tablet (250 mg) once daily on Days 2 through 5.   No facility-administered encounter medications on file as of 03/01/2017.      Review of Systems  Constitutional:   No  weight loss, night sweats,  Fevers, chills, + fatigue, or  lassitude.  HEENT:   No headaches,  Difficulty swallowing,  Tooth/dental problems, or  Sore throat,                No sneezing, itching, ear ache, nasal congestion, post nasal drip,   CV:  No chest pain,  Orthopnea, PND,   anasarca, dizziness, palpitations, syncope.   GI  No heartburn, indigestion, abdominal pain, nausea, vomiting, diarrhea, change in bowel habits, loss of appetite, bloody stools.   Resp:  No chest wall deformity  Skin: no rash or lesions.  GU: no dysuria, change in color of urine, no urgency or frequency.  No flank pain, no hematuria   MS:  No joint pain or swelling.  No decreased range of motion.  No back pain.    Physical Exam  BP 122/78 (BP Location: Left Arm, Cuff Size: Normal)   Pulse 98   Ht _0  (1.727 m)   Wt 134 lb (60.8 kg)   SpO2 99%   BMI 20.37 kg/m   GEN: A/Ox3; pleasant , NAD, frail and elderly on oxygen   HEENT:  [place]/AT,  EACs-clear, TMs-wnl, NOSE-clear, THROAT-clear, no lesions, no postnasal drip or exudate noted.   NECK:  Supple w/ fair ROM; no JVD; normal carotid impulses w/o bruits; no thyromegaly or nodules  palpated; no lymphadenopathy.    RESP scattered rhonchi  no accessory muscle use, no dullness to percussion  CARD:  RRR, no m/r/g, 1+ left greater than right peripheral edema, pulses intact, no cyanosis or clubbing.  GI:   Soft & nt; nml bowel sounds; no organomegaly or masses detected.   Musco: Warm bil, no deformities or joint swelling noted.   Neuro: alert, no focal deficits noted.    Skin: Warm, no lesions or rashes    Lab Results:  CBC    Component Value Date/Time   WBC 5.3 02/05/2017 0732   RBC 3.65 (L) 02/05/2017 0732   HGB 11.7 (L) 02/05/2017 0732   HCT 35.6 (L) 02/05/2017 0732   PLT 153 02/05/2017 0732   MCV 97.5 02/05/2017 0732   MCH 32.1 02/05/2017 0732   MCHC 32.9 02/05/2017 0732   RDW 13.0 02/05/2017 0732   LYMPHSABS 0.7 02/04/2017 1513   MONOABS 0.8 02/04/2017 1513   EOSABS 0.0 02/04/2017 1513   BASOSABS 0.0 02/04/2017 1513    BMET    Component Value Date/Time   NA 137 03/01/2017 1253   NA 142 10/03/2016 1417   K 3.8 03/01/2017 1253   CL 97 03/01/2017 1253   CO2 34 (H) 03/01/2017 1253   GLUCOSE 121 (H) 03/01/2017 1253   BUN 27 (H) 03/01/2017 1253   BUN 16 10/03/2016 1417   CREATININE 1.16 03/01/2017 1253   CALCIUM 9.6 03/01/2017 1253   GFRNONAA >60 02/05/2017 0732   GFRAA >60 02/05/2017 0732    BNP No results found for: BNP  ProBNP    Component Value Date/Time   PROBNP 71.0 04/15/2015 1118    Imaging: Dg Chest 2 View  Result Date: 02/14/2017 CLINICAL DATA:  Shortness of breath.  COPD. EXAM: CHEST  2 VIEW COMPARISON:  02/08/2017 FINDINGS: The  heart size and pulmonary vascularity are normal. CABG. Hyperinflated lungs with flattening of the diaphragm. No infiltrates or effusions. No acute bone abnormality. Aortic atherosclerosis IMPRESSION: No acute abnormality. Aortic Atherosclerosis (ICD10-I70.0) and Emphysema (ICD10-J43.9). Electronically Signed   By: Lorriane Shire M.D.   On: 02/14/2017 16:12   Dg Chest 2 View  Result Date:  02/08/2017 CLINICAL DATA:  Influenza.  Productive cough.  Ex-smoker. EXAM: CHEST  2 VIEW COMPARISON:  02/06/2017 FINDINGS: Hyperinflation. Mild pectus excavatum deformity. Midline trachea. Normal heart size. Atherosclerosis in the transverse aorta. Prior median sternotomy. Left costophrenic angle blunting on the frontal is chronic and likely due to pleural thickening. There is also mild left apical pleural thickening. No lobar consolidation. IMPRESSION: Hyperinflation, without acute disease. Aortic Atherosclerosis (ICD10-I70.0). Electronically Signed   By: Abigail Miyamoto M.D.   On: 02/08/2017 09:06   Dg Chest 2 View  Result Date: 02/06/2017 CLINICAL DATA:  Cough. Diagnosed with flu on Saturday 02/03/2017. Fever back to normal on Sunday. Former smoker. EXAM: CHEST  2 VIEW COMPARISON:  02/04/2017 FINDINGS: Postoperative changes in the mediastinum. Normal heart size and pulmonary vascularity. Prominent emphysematous changes in the lungs. Peribronchial thickening with central interstitial changes consistent with chronic bronchitis. No airspace disease or consolidation in the lungs. No blunting of costophrenic angles. No pneumothorax. Mediastinal contours appear intact. Calcification of the aorta. Degenerative changes in the spine. IMPRESSION: Prominent emphysematous changes and chronic bronchitic changes in the lungs. No evidence of active pulmonary disease. Electronically Signed   By: Lucienne Capers M.D.   On: 02/06/2017 23:00   Dg Chest 2 View  Result Date: 02/04/2017 CLINICAL DATA:  Patient with history of COPD. EXAM: CHEST  2 VIEW COMPARISON:  Chest radiograph 01/11/2017 FINDINGS: Monitoring leads overlie the patient. Stable cardiac and mediastinal contours. No consolidative pulmonary opacities. Pulmonary hyperinflation. No pleural effusion or pneumothorax. Thoracic spine degenerative changes. Lateral view nondiagnostic due to overlapping soft tissues superiorly. IMPRESSION: No acute osseous abnormality.  Electronically Signed   By: Lovey Newcomer M.D.   On: 02/04/2017 16:31     Assessment & Plan:   COPD GOLD IV/ 02 dep /steroid dep Recurrent COPD exacerbation in patient with very severe COPD that is oxygen and steroid-dependent.  Patient is prone to frequent exacerbations.  Despite using Trilogy vent at bedtime for his chronic hypercarbia patient continues to have significant deconditioning and decreased activity tolerance. We will treat for an acute exacerbation with a Z-Pak today.  Could consider adding daily azithromycin however patient is on chronic steroids as well which may increase his risk for drug resistance going forward. His condition is complicated by diastolic heart failure and cor pulmonale.  We will try to add in Aldactone but at a lower dose along with his furosemide.  Labs including be met and alpha-1 testing is pending today  Plan  Patient Instructions  Zpack take as directed.  Mucinex DM Twice daily  As needed  Cough/congestion with flutter valve.  Taper prednisone to 23m daily as discussed.  Saline nasal rinses As needed   Continue on Furosemide 275mdaily .  Retry Aldactone but at 5031m/2 daily .  Labs today .  Continue on Trilogy At bedtime.  May use Xopenex every 4hr as needed.  Follow up with Dr. WerMelvyn Novasn 3-4 weeks  and As needed   Please contact office for sooner follow up if symptoms do not improve or worsen or seek emergency care      Chronic respiratory failure with hypoxia and hypercapnia (  [place]) Continue on oxygen to keep O2 saturations greater than 88-90%.  Continue on Trilogy vent At bedtime  .    Sinusitis, chronic Continue on current regimen     Rexene Edison, NP 03/01/2017

## 2017-03-02 ENCOUNTER — Telehealth: Payer: Self-pay | Admitting: Vascular Surgery

## 2017-03-02 DIAGNOSIS — I10 Essential (primary) hypertension: Secondary | ICD-10-CM | POA: Diagnosis not present

## 2017-03-02 DIAGNOSIS — J09X2 Influenza due to identified novel influenza A virus with other respiratory manifestations: Secondary | ICD-10-CM | POA: Diagnosis not present

## 2017-03-02 DIAGNOSIS — J9612 Chronic respiratory failure with hypercapnia: Secondary | ICD-10-CM | POA: Diagnosis not present

## 2017-03-02 DIAGNOSIS — J9611 Chronic respiratory failure with hypoxia: Secondary | ICD-10-CM | POA: Diagnosis not present

## 2017-03-02 DIAGNOSIS — I2510 Atherosclerotic heart disease of native coronary artery without angina pectoris: Secondary | ICD-10-CM | POA: Diagnosis not present

## 2017-03-02 DIAGNOSIS — J441 Chronic obstructive pulmonary disease with (acute) exacerbation: Secondary | ICD-10-CM | POA: Diagnosis not present

## 2017-03-02 NOTE — Telephone Encounter (Signed)
Sched appt 05/30/17; lab at 2:00 and MD at 2:45. Lm on hm# to inform pt of appt.

## 2017-03-02 NOTE — Telephone Encounter (Signed)
-----   Message from Viann Fish, NP sent at 03/01/2017  6:18 PM EST ----- Regarding: bilateral TBI are 0.00, but asymptomatic Patient called with results. I asked how his feet and legs are doing since bilateral TBI are 0.00, were better at his previous visit. He has not been walking much lately as he has been hospitalized with the flu. He has no discoloration of his toes or feet, no ulcers. He is taking prednisone for his breathing. His feet are more swollen.  We talked about feet elevation above his heart overnight and for 20 minutes 3-4x/day. This is a difficult position for him.  Since he has no rest pain and no signs of ischemia in his feet or legs, will schedule him to return in 3 months with ABI's and see Dr. Scot Dock (since I am not working on Wednesdays and that is when Dr. Scot Dock is usually in the office). I advised pt to notify our office if he develops rest pain or sores that don't heal in his feet or legs.   Schedulers: Please call pt to schedule him for ABI's and see Dr. Scot Dock in 3 months.   Dr. Scot Dock, If you want to see pt sooner or have different diagnostic studies for his legs, please let schedulers know.  Thank you, Vinnie Level

## 2017-03-02 NOTE — Progress Notes (Signed)
Chart and office note reviewed in detail  > agree with a/p as outlined    

## 2017-03-05 ENCOUNTER — Other Ambulatory Visit: Payer: Self-pay

## 2017-03-05 DIAGNOSIS — J9611 Chronic respiratory failure with hypoxia: Secondary | ICD-10-CM | POA: Diagnosis not present

## 2017-03-05 DIAGNOSIS — I739 Peripheral vascular disease, unspecified: Secondary | ICD-10-CM

## 2017-03-05 DIAGNOSIS — I10 Essential (primary) hypertension: Secondary | ICD-10-CM | POA: Diagnosis not present

## 2017-03-05 DIAGNOSIS — J09X2 Influenza due to identified novel influenza A virus with other respiratory manifestations: Secondary | ICD-10-CM | POA: Diagnosis not present

## 2017-03-05 DIAGNOSIS — J9612 Chronic respiratory failure with hypercapnia: Secondary | ICD-10-CM | POA: Diagnosis not present

## 2017-03-05 DIAGNOSIS — J441 Chronic obstructive pulmonary disease with (acute) exacerbation: Secondary | ICD-10-CM | POA: Diagnosis not present

## 2017-03-05 DIAGNOSIS — I2510 Atherosclerotic heart disease of native coronary artery without angina pectoris: Secondary | ICD-10-CM | POA: Diagnosis not present

## 2017-03-06 ENCOUNTER — Telehealth: Payer: Self-pay | Admitting: Internal Medicine

## 2017-03-06 LAB — ALPHA-1 ANTITRYPSIN PHENOTYPE: A-1 Antitrypsin, Ser: 124 mg/dL (ref 83–199)

## 2017-03-06 NOTE — Telephone Encounter (Signed)
Left message for patient to call back  

## 2017-03-06 NOTE — Telephone Encounter (Signed)
Should hold if bp low standing (light headed) or < 95 systolic sitting or if no detectable fluid in legs

## 2017-03-06 NOTE — Telephone Encounter (Signed)
Pt was seen by TP 03/01/17.  I called pt to let him know the results of his recent labwork he had done and while speaking with pt, he stated to me he was concerned about the 20mg  lasix due to BP dropping and wanted to make sure it was okay for him to still take this due to his BP dropping.  Dr. [name], please advise if pt is still okay to take the 20mg  lasix with his BP dropping.  Thanks!

## 2017-03-07 ENCOUNTER — Telehealth: Payer: Self-pay | Admitting: Interventional Cardiology

## 2017-03-07 ENCOUNTER — Other Ambulatory Visit: Payer: Self-pay | Admitting: *Deleted

## 2017-03-07 NOTE — Telephone Encounter (Signed)
Pt states he had the flu last month and continues to have issues with weakness, fatigue and leg swelling.  No weight available.  Pt states he has Furosemide at home as needed for swelling.  States he took this for 3 straight days, last dose was Sunday.  Pt also c/o irregular heartbeat.  States he can feel 1-2-3 beats and then a pause.  States he will have 10 regular beats and then have the irregularity.  Has lightheadedness and dizziness off and on, usually associated with position changes.  Pt checked BP this AM and it was 136/80.  Took it after standing up and it dropped to 95/70. Denies near syncope.  Scheduled pt to see Cecilie Kicks, NP tomorrow.  Pt verbalized understanding and was in agreement with this plan.

## 2017-03-07 NOTE — Telephone Encounter (Signed)
Spoke with pt. He is aware of MW's message. Nothing further was needed.

## 2017-03-07 NOTE — Patient Outreach (Signed)
[facility]) Care Management  03/07/2017  [name] 1929/06/24 [id number]    Transition of care (2nd week)  RN spoke with the pt's primary caregiver today introduced the [facility] program and services. Spouse indicated the pt was sleeping but was very emotional in stating she felt "Nobody is helping me and he's getting worse". States [facility] is involved and pt has an appointment tomorrow with his CAD provider. Caregiver states pt getting worse and he has been lethargic. RN inquired if the pt's providers were aware. Caregiver indicated all his providers are aware but she feels no one is addressing his needs. She states I need a case manager to report to and communicate with her provider on a regular based with making appointments when needed. RN attempted to explained in detail that this can be done however [facility] services was not for urgent care however case manager can intervention and report findings to the pt's provider with home visits but this would not be long term and limited to how often pt would require such services that may require a different type of case management services for possible facility placement if pt needs such attention on a regular based or more in dept services with the involved [place] agency. Since caregiver did not wish to contact the provider concerning pt's needs. RN offered to alert the on call nursing staff with [facility] who could address his needs after hours if pt was in any distress. Caregiver spouse refused and indicated it was nothing pressing that could not wait until tomorrow and declined assist from this RN case manager. RN explained the to pt that a conversation has already taken place with the pt last week and he declined home visits with this case manager (she was not aware) however RN offered to completed a home visit based upon pt's appointment and scheduling. Caregiver very frustrated and requested a call back tomorrow. Therefore  the care plan was not addressed along with the interventions and goal that were are place. Will reiterated on the plan of care tomorrow however will attempt to speak with the pt to confirm [facility] services accordingly to his wishes for [facility] services. No other request or inquires at this time as the call ended. RN will scheduled a call back tomorrow.  [name], RN Care Management Coordinator [place] Office [phone]

## 2017-03-07 NOTE — Telephone Encounter (Signed)
New Message    Pt c/o swelling: STAT is pt has developed SOB within 24 hours  1) How much weight have you gained and in what time span? unknown  2) If swelling, where is the swelling located? legs  3) Are you currently taking a fluid pill? No, doctor stopped the lasix  4) Are you currently SOB? Yes, but he has severe copd   5) Do you have a log of your daily weights (if so, list)? No   6) Have you gained 3 pounds in a day or 5 pounds in a week? unknown  7) Have you traveled recently? No    STAT if HR is under 50 or over 120 (normal HR is 60-100 beats per minute)  1) What is your heart rate? 79-94, patient states that it varies.  2) Do you have a log of your heart rate readings (document readings)? No   3) Do you have any other symptoms? extermely tired and fatigue. Feels like his heart skips beats

## 2017-03-08 ENCOUNTER — Emergency Department (HOSPITAL_COMMUNITY): Payer: Medicare Other

## 2017-03-08 ENCOUNTER — Other Ambulatory Visit: Payer: Self-pay | Admitting: *Deleted

## 2017-03-08 ENCOUNTER — Inpatient Hospital Stay (HOSPITAL_COMMUNITY)
Admission: EM | Admit: 2017-03-08 | Discharge: 2017-03-12 | DRG: 281 | Disposition: A | Discharge status: Home Health | Payer: Medicare Other | Attending: Internal Medicine | Admitting: Internal Medicine

## 2017-03-08 ENCOUNTER — Encounter: Payer: Self-pay | Admitting: Cardiology

## 2017-03-08 ENCOUNTER — Other Ambulatory Visit: Payer: Self-pay

## 2017-03-08 ENCOUNTER — Ambulatory Visit (INDEPENDENT_AMBULATORY_CARE_PROVIDER_SITE_OTHER): Payer: Medicare Other | Admitting: Cardiology

## 2017-03-08 ENCOUNTER — Encounter: Payer: Self-pay | Admitting: *Deleted

## 2017-03-08 ENCOUNTER — Encounter (HOSPITAL_COMMUNITY): Payer: Self-pay | Admitting: Emergency Medicine

## 2017-03-08 VITALS — BP 122/70 | HR 102 | Ht 68.0 in | Wt 138.1 lb

## 2017-03-08 DIAGNOSIS — Z8249 Family history of ischemic heart disease and other diseases of the circulatory system: Secondary | ICD-10-CM

## 2017-03-08 DIAGNOSIS — I214 Non-ST elevation (NSTEMI) myocardial infarction: Secondary | ICD-10-CM | POA: Diagnosis not present

## 2017-03-08 DIAGNOSIS — I219 Acute myocardial infarction, unspecified: Secondary | ICD-10-CM

## 2017-03-08 DIAGNOSIS — I25119 Atherosclerotic heart disease of native coronary artery with unspecified angina pectoris: Secondary | ICD-10-CM | POA: Diagnosis not present

## 2017-03-08 DIAGNOSIS — J9611 Chronic respiratory failure with hypoxia: Secondary | ICD-10-CM | POA: Diagnosis not present

## 2017-03-08 DIAGNOSIS — R002 Palpitations: Secondary | ICD-10-CM | POA: Diagnosis not present

## 2017-03-08 DIAGNOSIS — I2781 Cor pulmonale (chronic): Secondary | ICD-10-CM | POA: Diagnosis present

## 2017-03-08 DIAGNOSIS — I248 Other forms of acute ischemic heart disease: Secondary | ICD-10-CM | POA: Diagnosis not present

## 2017-03-08 DIAGNOSIS — Z7951 Long term (current) use of inhaled steroids: Secondary | ICD-10-CM

## 2017-03-08 DIAGNOSIS — I951 Orthostatic hypotension: Secondary | ICD-10-CM

## 2017-03-08 DIAGNOSIS — K219 Gastro-esophageal reflux disease without esophagitis: Secondary | ICD-10-CM

## 2017-03-08 DIAGNOSIS — Z951 Presence of aortocoronary bypass graft: Secondary | ICD-10-CM

## 2017-03-08 DIAGNOSIS — I471 Supraventricular tachycardia: Secondary | ICD-10-CM | POA: Diagnosis present

## 2017-03-08 DIAGNOSIS — I110 Hypertensive heart disease with heart failure: Secondary | ICD-10-CM | POA: Diagnosis not present

## 2017-03-08 DIAGNOSIS — R000 Tachycardia, unspecified: Secondary | ICD-10-CM | POA: Diagnosis not present

## 2017-03-08 DIAGNOSIS — I700 Atherosclerosis of aorta: Secondary | ICD-10-CM | POA: Diagnosis not present

## 2017-03-08 DIAGNOSIS — R778 Other specified abnormalities of plasma proteins: Secondary | ICD-10-CM

## 2017-03-08 DIAGNOSIS — Z79899 Other long term (current) drug therapy: Secondary | ICD-10-CM

## 2017-03-08 DIAGNOSIS — J449 Chronic obstructive pulmonary disease, unspecified: Secondary | ICD-10-CM | POA: Diagnosis not present

## 2017-03-08 DIAGNOSIS — I509 Heart failure, unspecified: Secondary | ICD-10-CM

## 2017-03-08 DIAGNOSIS — Z7982 Long term (current) use of aspirin: Secondary | ICD-10-CM

## 2017-03-08 DIAGNOSIS — R531 Weakness: Secondary | ICD-10-CM

## 2017-03-08 DIAGNOSIS — I6529 Occlusion and stenosis of unspecified carotid artery: Secondary | ICD-10-CM | POA: Diagnosis present

## 2017-03-08 DIAGNOSIS — I071 Rheumatic tricuspid insufficiency: Secondary | ICD-10-CM | POA: Diagnosis present

## 2017-03-08 DIAGNOSIS — I2583 Coronary atherosclerosis due to lipid rich plaque: Secondary | ICD-10-CM | POA: Diagnosis present

## 2017-03-08 DIAGNOSIS — Z888 Allergy status to other drugs, medicaments and biological substances status: Secondary | ICD-10-CM

## 2017-03-08 DIAGNOSIS — R600 Localized edema: Secondary | ICD-10-CM | POA: Diagnosis present

## 2017-03-08 DIAGNOSIS — Z881 Allergy status to other antibiotic agents status: Secondary | ICD-10-CM

## 2017-03-08 DIAGNOSIS — I4510 Unspecified right bundle-branch block: Secondary | ICD-10-CM | POA: Diagnosis present

## 2017-03-08 DIAGNOSIS — T502X5A Adverse effect of carbonic-anhydrase inhibitors, benzothiadiazides and other diuretics, initial encounter: Secondary | ICD-10-CM | POA: Diagnosis present

## 2017-03-08 DIAGNOSIS — Z87891 Personal history of nicotine dependence: Secondary | ICD-10-CM

## 2017-03-08 DIAGNOSIS — I952 Hypotension due to drugs: Secondary | ICD-10-CM | POA: Diagnosis present

## 2017-03-08 DIAGNOSIS — Z9981 Dependence on supplemental oxygen: Secondary | ICD-10-CM

## 2017-03-08 DIAGNOSIS — R079 Chest pain, unspecified: Secondary | ICD-10-CM | POA: Diagnosis not present

## 2017-03-08 DIAGNOSIS — N1330 Unspecified hydronephrosis: Secondary | ICD-10-CM | POA: Diagnosis present

## 2017-03-08 DIAGNOSIS — I10 Essential (primary) hypertension: Secondary | ICD-10-CM

## 2017-03-08 DIAGNOSIS — F419 Anxiety disorder, unspecified: Secondary | ICD-10-CM | POA: Diagnosis present

## 2017-03-08 DIAGNOSIS — G4733 Obstructive sleep apnea (adult) (pediatric): Secondary | ICD-10-CM

## 2017-03-08 DIAGNOSIS — R0602 Shortness of breath: Secondary | ICD-10-CM

## 2017-03-08 DIAGNOSIS — Z882 Allergy status to sulfonamides status: Secondary | ICD-10-CM

## 2017-03-08 DIAGNOSIS — I25709 Atherosclerosis of coronary artery bypass graft(s), unspecified, with unspecified angina pectoris: Secondary | ICD-10-CM

## 2017-03-08 DIAGNOSIS — I2510 Atherosclerotic heart disease of native coronary artery without angina pectoris: Secondary | ICD-10-CM | POA: Diagnosis present

## 2017-03-08 DIAGNOSIS — R7989 Other specified abnormal findings of blood chemistry: Secondary | ICD-10-CM

## 2017-03-08 DIAGNOSIS — I739 Peripheral vascular disease, unspecified: Secondary | ICD-10-CM | POA: Diagnosis present

## 2017-03-08 DIAGNOSIS — Z7952 Long term (current) use of systemic steroids: Secondary | ICD-10-CM

## 2017-03-08 DIAGNOSIS — N400 Enlarged prostate without lower urinary tract symptoms: Secondary | ICD-10-CM | POA: Diagnosis present

## 2017-03-08 DIAGNOSIS — I5033 Acute on chronic diastolic (congestive) heart failure: Secondary | ICD-10-CM | POA: Diagnosis present

## 2017-03-08 DIAGNOSIS — E785 Hyperlipidemia, unspecified: Secondary | ICD-10-CM | POA: Diagnosis present

## 2017-03-08 HISTORY — DX: Other specified abnormal findings of blood chemistry: R79.89

## 2017-03-08 LAB — BASIC METABOLIC PANEL
Anion gap: 10 (ref 5–15)
BUN: 17 mg/dL (ref 6–20)
CO2: 28 mmol/L (ref 22–32)
Calcium: 9.2 mg/dL (ref 8.9–10.3)
Chloride: 97 mmol/L — ABNORMAL LOW (ref 101–111)
Creatinine, Ser: 1.13 mg/dL (ref 0.61–1.24)
GFR calc Af Amer: >60 mL/min (ref >60)
GFR calc non Af Amer: 57 mL/min — ABNORMAL LOW (ref >60)
Glucose, Bld: 193 mg/dL — ABNORMAL HIGH (ref 65–99)
Potassium: 4.5 mmol/L (ref 3.5–5.1)
Sodium: 135 mmol/L (ref 135–145)

## 2017-03-08 LAB — TSH: TSH: 1.851 u[IU]/mL (ref 0.350–4.500)

## 2017-03-08 LAB — CBC
HCT: 41.7 % (ref 39.0–52.0)
Hemoglobin: 13.7 g/dL (ref 13.0–17.0)
MCH: 32.2 pg (ref 26.0–34.0)
MCHC: 32.9 g/dL (ref 30.0–36.0)
MCV: 98.1 fL (ref 78.0–100.0)
Platelets: 199 10*3/uL (ref 150–400)
RBC: 4.25 MIL/uL (ref 4.22–5.81)
RDW: 12.9 % (ref 11.5–15.5)
WBC: 13.8 10*3/uL — ABNORMAL HIGH (ref 4.0–10.5)

## 2017-03-08 LAB — URINALYSIS, ROUTINE W REFLEX MICROSCOPIC
Bilirubin Urine: NEGATIVE
Glucose, UA: NEGATIVE mg/dL
Hgb urine dipstick: NEGATIVE
Ketones, ur: NEGATIVE mg/dL
Leukocytes, UA: NEGATIVE
Nitrite: NEGATIVE
Protein, ur: NEGATIVE mg/dL
Specific Gravity, Urine: 1.008 (ref 1.005–1.030)
pH: 8 (ref 5.0–8.0)

## 2017-03-08 LAB — I-STAT TROPONIN, ED: Troponin i, poc: 0.29 ng/mL (ref 0.00–0.08)

## 2017-03-08 LAB — TROPONIN I: Troponin I: 0.33 ng/mL (ref <0.03)

## 2017-03-08 LAB — BRAIN NATRIURETIC PEPTIDE: B Natriuretic Peptide: 237.5 pg/mL — ABNORMAL HIGH (ref 0.0–100.0)

## 2017-03-08 MED ORDER — ALPRAZOLAM 0.25 MG PO TABS
0.2500 mg | ORAL_TABLET | Freq: Three times a day (TID) | ORAL | Status: DC
  Administered 2017-03-08 – 2017-03-11 (×10): 0.25 mg via ORAL
  Administered 2017-03-12: 0.125 mg via ORAL
  Filled 2017-03-08 (×11): qty 1

## 2017-03-08 MED ORDER — LEVALBUTEROL HCL 1.25 MG/0.5ML IN NEBU: 1.2500 mg | INHALATION_SOLUTION | Freq: Four times a day (QID) | RESPIRATORY_TRACT | Status: DC | PRN

## 2017-03-08 MED ORDER — FLUTICASONE PROPIONATE 50 MCG/ACT NA SUSP
2.0000 | Freq: Every day | NASAL | Status: DC
  Administered 2017-03-08 – 2017-03-12 (×4): 2 via NASAL
  Filled 2017-03-08: qty 16

## 2017-03-08 MED ORDER — B COMPLEX-C PO TABS
1.0000 | ORAL_TABLET | Freq: Every day | ORAL | Status: DC
Start: 2017-03-09 — End: 2017-03-12
  Administered 2017-03-09 – 2017-03-12 (×4): 1 via ORAL
  Filled 2017-03-08 (×4): qty 1

## 2017-03-08 MED ORDER — ASPIRIN 81 MG PO CHEW: 324.0000 mg | CHEWABLE_TABLET | ORAL | Status: AC

## 2017-03-08 MED ORDER — ASPIRIN 300 MG RE SUPP
300.0000 mg | RECTAL | Status: AC
  Filled 2017-03-08: qty 1

## 2017-03-08 MED ORDER — ACETAMINOPHEN 325 MG PO TABS: 650.0000 mg | ORAL_TABLET | ORAL | Status: DC | PRN

## 2017-03-08 MED ORDER — FINASTERIDE 5 MG PO TABS
5.0000 mg | ORAL_TABLET | Freq: Every morning | ORAL | Status: DC
  Administered 2017-03-09 – 2017-03-12 (×4): 5 mg via ORAL
  Filled 2017-03-08 (×4): qty 1

## 2017-03-08 MED ORDER — VITAMIN D3 25 MCG (1000 UNIT) PO TABS
2000.0000 [IU] | ORAL_TABLET | Freq: Every day | ORAL | Status: DC
  Administered 2017-03-09 – 2017-03-12 (×4): 2000 [IU] via ORAL
  Filled 2017-03-08 (×8): qty 2

## 2017-03-08 MED ORDER — VITAMIN B 12 100 MCG PO LOZG: 1.0000 | LOZENGE | Freq: Every day | ORAL | Status: DC

## 2017-03-08 MED ORDER — HEPARIN (PORCINE) IN NACL 100-0.45 UNIT/ML-% IJ SOLN
750.0000 [IU]/h | INTRAMUSCULAR | Status: DC
  Administered 2017-03-08: 750 [IU]/h via INTRAVENOUS
  Administered 2017-03-11: 900 [IU]/h via INTRAVENOUS
  Filled 2017-03-08 (×3): qty 250

## 2017-03-08 MED ORDER — FUROSEMIDE 10 MG/ML IJ SOLN
40.0000 mg | Freq: Two times a day (BID) | INTRAMUSCULAR | Status: DC
  Administered 2017-03-08 – 2017-03-09 (×2): 40 mg via INTRAVENOUS
  Filled 2017-03-08 (×4): qty 4

## 2017-03-08 MED ORDER — NITROGLYCERIN 0.4 MG SL SUBL: 0.4000 mg | SUBLINGUAL_TABLET | SUBLINGUAL | Status: DC | PRN

## 2017-03-08 MED ORDER — IOPAMIDOL (ISOVUE-370) INJECTION 76%
INTRAVENOUS | Status: AC
  Administered 2017-03-08: 100 mL
  Filled 2017-03-08: qty 100

## 2017-03-08 MED ORDER — ASPIRIN 81 MG PO CHEW
324.0000 mg | CHEWABLE_TABLET | Freq: Once | ORAL | Status: AC
  Administered 2017-03-08: 324 mg via ORAL
  Filled 2017-03-08: qty 4

## 2017-03-08 MED ORDER — TIOTROPIUM BROMIDE MONOHYDRATE 18 MCG IN CAPS
1.0000 | ORAL_CAPSULE | Freq: Every morning | RESPIRATORY_TRACT | Status: DC
  Filled 2017-03-08 (×2): qty 5

## 2017-03-08 MED ORDER — ONDANSETRON HCL 4 MG/2ML IJ SOLN: 4.0000 mg | Freq: Four times a day (QID) | INTRAMUSCULAR | Status: DC | PRN

## 2017-03-08 MED ORDER — ASPIRIN EC 81 MG PO TBEC
81.0000 mg | DELAYED_RELEASE_TABLET | Freq: Every day | ORAL | Status: DC
  Administered 2017-03-09 – 2017-03-12 (×4): 81 mg via ORAL
  Filled 2017-03-08 (×4): qty 1

## 2017-03-08 MED ORDER — ASPIRIN EC 81 MG PO TBEC: 81.0000 mg | DELAYED_RELEASE_TABLET | Freq: Every day | ORAL | Status: DC

## 2017-03-08 MED ORDER — PREDNISONE 20 MG PO TABS
10.0000 mg | ORAL_TABLET | Freq: Every day | ORAL | Status: DC
  Administered 2017-03-09 – 2017-03-12 (×4): 10 mg via ORAL
  Filled 2017-03-08 (×4): qty 1

## 2017-03-08 MED ORDER — OCUVITE-LUTEIN PO CAPS
1.0000 | ORAL_CAPSULE | Freq: Every day | ORAL | Status: DC
  Filled 2017-03-08: qty 1

## 2017-03-08 MED ORDER — METOPROLOL SUCCINATE ER 25 MG PO TB24
25.0000 mg | ORAL_TABLET | Freq: Every day | ORAL | Status: DC
  Administered 2017-03-09 – 2017-03-12 (×4): 25 mg via ORAL
  Filled 2017-03-08 (×4): qty 1

## 2017-03-08 MED ORDER — MOMETASONE FURO-FORMOTEROL FUM 200-5 MCG/ACT IN AERO
2.0000 | INHALATION_SPRAY | Freq: Two times a day (BID) | RESPIRATORY_TRACT | Status: DC
  Filled 2017-03-08 (×2): qty 8.8

## 2017-03-08 MED ORDER — VITAMIN B-12 100 MCG PO TABS
100.0000 ug | ORAL_TABLET | Freq: Every day | ORAL | Status: DC
  Administered 2017-03-09 – 2017-03-12 (×4): 100 ug via ORAL
  Filled 2017-03-08 (×4): qty 1

## 2017-03-08 MED ORDER — LEVALBUTEROL TARTRATE 45 MCG/ACT IN AERO: 2.0000 | INHALATION_SPRAY | Freq: Four times a day (QID) | RESPIRATORY_TRACT | Status: DC | PRN

## 2017-03-08 MED ORDER — SODIUM CHLORIDE 0.9 % IV SOLN
INTRAVENOUS | Status: DC
  Administered 2017-03-08: 19:00:00 via INTRAVENOUS

## 2017-03-08 MED ORDER — LEVALBUTEROL HCL 0.63 MG/3ML IN NEBU
0.6300 mg | INHALATION_SOLUTION | Freq: Once | RESPIRATORY_TRACT | Status: AC
  Administered 2017-03-08: 0.63 mg via RESPIRATORY_TRACT
  Filled 2017-03-08: qty 3

## 2017-03-08 MED ORDER — ATORVASTATIN CALCIUM 20 MG PO TABS
20.0000 mg | ORAL_TABLET | Freq: Every day | ORAL | Status: DC
  Administered 2017-03-08 – 2017-03-12 (×5): 20 mg via ORAL
  Filled 2017-03-08 (×5): qty 1

## 2017-03-08 MED ORDER — FAMOTIDINE 20 MG PO TABS
20.0000 mg | ORAL_TABLET | Freq: Every day | ORAL | Status: DC
  Administered 2017-03-09 – 2017-03-12 (×4): 20 mg via ORAL
  Filled 2017-03-08 (×4): qty 1

## 2017-03-08 MED ORDER — HEPARIN BOLUS VIA INFUSION
3750.0000 [IU] | Freq: Once | INTRAVENOUS | Status: AC
  Administered 2017-03-08: 3750 [IU] via INTRAVENOUS
  Filled 2017-03-08: qty 3750

## 2017-03-08 MED ORDER — B COMPLEX PO TABS: 1.0000 | ORAL_TABLET | Freq: Every day | ORAL | Status: DC

## 2017-03-08 NOTE — Patient Instructions (Signed)
Medication Instructions:  Your physician recommends that you continue on your current medications as directed. Please refer to the Current Medication list given to you today.  Labwork: None  Testing/Procedures: None  Follow-Up: Your follow up will be based on your hospital stay.   Any Other Special Instructions Will Be Listed Below (If Applicable).    If you need a refill on your cardiac medications before your next appointment, please call your pharmacy.

## 2017-03-08 NOTE — ED Notes (Signed)
Critical lab value noted; troponin 0.33 called from lab Heparin infusing

## 2017-03-08 NOTE — Progress Notes (Signed)
Cardiology Office Note   Date:  03/08/2017   ID:  [name], DOB Sep 16, 1929, MRN [id number]  PCP:  [name], MD  Cardiologist:  Dr. Tamala Julian     Chief Complaint  Patient presents with  . Leg Swelling    weakness, palpatatins      History of Present Illness: [name] is a 84 y.o. male who presents for edema, after flu last month.  Also palpitations.   Also orthostatic hypotension.  After 3 days of lasix --though he has had this in the past.   Last seen by Dr. Tamala Julian 11/14/16    CAD with prior coronary bypass grafting, severe COPD with cor pulmonale, FEV1 0.58 in 2017, on 2 L oxygen and nocturnal ventilation, steroid dependent with home oxygen,uses trilogy machine,  hyperlipidemia, hypertension, PAD, and history of carotid obstructive disease, hx RBBB   Pt was hospitalized with influenza A,  ST with RBBB on EKG and troponin poc was neg.  When he came home he was doing well but this week he has felt very weak.  Can hardly get around.  He was taking his lasix but then BP would drop to 80 systolic, so he has not had since the 23rd.  His wt is up 3 lbs today.   Unsure why sudden change in condition.  His HR is rapid and with premature beats vs. PACs.  Some chest discomfort but with deep breaths and turning head back and forth it resolves.   He is not afraid to go home but is concerned but his wife is more afraid.    He is now off antibiotics, and did not tolerate the spironolactone.  Made him feel bad.  On oxygen 24/7 and triology at night.  He was on higher dose of steroids but now back to baseline.  Past Medical History:  Diagnosis Date  . Asthma   . BPH (benign prostatic hyperplasia)    severe; s/p multiple biopsies  . CAD (coronary artery disease)   . Carotid artery occlusion   . Chronic rhinitis   . COPD (chronic obstructive pulmonary disease) (HCC)    2L [place] O2  . Hyperlipidemia   . Hypertension   . On home oxygen therapy    "~ 24/7" (02/08/2017)  . Peripheral  vascular disease ([facility])   . Syncope and collapse   . Ureteral tumor 08/2015   had endoscopic procedure for evaluation, unable to reach for biopsy    Past Surgical History:  Procedure Laterality Date  . CATARACT EXTRACTION  11-20-11  . CORONARY ARTERY BYPASS GRAFT  10/2001   "CABG X4"  . LITHOTRIPSY    . PROSTATE BIOPSY  Oct. 2014     Current Outpatient Medications  Medication Sig Dispense Refill  . ALPRAZolam (XANAX) 0.25 MG tablet Take 1 tablet (0.25 mg total) by mouth 3 (three) times daily. Takes 1/2 tablet two times daily. 90 tablet 2  . aspirin 81 MG tablet Take 81 mg by mouth daily.    Marland Kitchen atorvastatin (LIPITOR) 20 MG tablet TAKE 1 TABLET BY MOUTH EVERY DAY 90 tablet 2  . b complex vitamins tablet Take 1 tablet by mouth daily.    . Cholecalciferol (VITAMIN D3) 2000 units TABS Take 2,000 Units by mouth daily.    . Cyanocobalamin (VITAMIN B 12 PO) Take 1 tablet daily by mouth.     . finasteride (PROSCAR) 5 MG tablet Take 5 mg by mouth every morning.    Marland Kitchen guaiFENesin (MUCINEX) 600 MG  12 hr tablet Take by mouth 2 (two) times daily as needed.    . mometasone (NASONEX) 50 MCG/ACT nasal spray SHAKE LIQUID AND USE 2 SPRAYS IN EACH NOSTRIL DAILY 17 g 11  . Multiple Vitamins-Minerals (PRESERVISION AREDS 2) CAPS Take 1 capsule by mouth daily.    . nitroGLYCERIN (NITROSTAT) 0.4 MG SL tablet DISSOLVE 1 TABLET UNDER THE TONGUE AS DIRECTED AND AS NEEDED FOR CHEST PAIN 25 tablet 4  . OXYGEN Inhale 2 L continuous into the lungs.     . predniSONE (DELTASONE) 10 MG tablet Take 10 mg by mouth daily with breakfast.    . ranitidine (ZANTAC) 150 MG tablet Take 150 mg by mouth 2 (two) times daily.    . Sodium Chloride, Hypertonic, (MURO 128 OP) Place 1 drop 4 (four) times daily into both eyes.    Marland Kitchen SPIRIVA RESPIMAT 2.5 MCG/ACT AERS INHALE 2 PUFFS BY MOUTH EVERY DAY 4 g 11  . SYMBICORT 160-4.5 MCG/ACT inhaler INHALE 2 PUFFS BY MOUTH EVERY MORNING AND THEN ANOTHER 2 PUFFS ABOUT 12 HOURS LATER 1 Inhaler 6    . TOPROL XL 25 MG 24 hr tablet TAKE 1 TABLET BY MOUTH DAILY 30 tablet 11  . XOPENEX HFA 45 MCG/ACT inhaler INHALE 2 PUFFS BY MOUTH EVERY 4 HOURS AS NEEDED FOR WHEEZING OR SHORTNESS OF BREATH 15 g 11  . furosemide (LASIX) 20 MG tablet Take 1 tablet (20 mg total) by mouth daily. Can take one additional pill if needed for swelling. (Patient not taking: Reported on 03/08/2017) 45 tablet 1  . spironolactone (ALDACTONE) 50 MG tablet Take 1 tablet (50 mg total) by mouth daily. (Patient not taking: Reported on 03/08/2017) 30 tablet 2   No current facility-administered medications for this visit.     Allergies:   Sulfonamide derivatives; Cefdinir; Ciprofloxacin; Nitrofurantoin; and Levaquin [levofloxacin]    Social History:  The patient  reports that he quit smoking about 35 years ago. His smoking use included cigarettes. He has a 66.00 pack-year smoking history. he has never used smokeless tobacco. He reports that he drinks alcohol. He reports that he does not use drugs.   Family History:  The patient's family history includes Allergies in his brother; Breast cancer in his mother; Cancer in his mother; Heart attack in his father; Heart disease in his brother, brother, father, and mother; Hyperlipidemia in his brother; Hypertension in his brother and mother; Other in his mother; Peripheral vascular disease in his brother; Stroke in his brother.    ROS:  General:no colds or fevers, + weight gain Skin:no rashes or ulcers HEENT:no blurred vision, no congestion, stable SOB CV:see HPI PUL:see HPI GI:no diarrhea constipation or melena, no indigestion GU:no hematuria, no dysuria MS:no joint pain, no claudication Neuro:no syncope, no lightheadedness, though so weak he feels he could pass out Endo:no diabetes, no thyroid disease  Wt Readings from Last 3 Encounters:  03/08/17 138 lb 1.9 oz (62.7 kg)  03/01/17 134 lb (60.8 kg)  02/20/17 135 lb (61.2 kg)     PHYSICAL EXAM: VS:  BP 122/70   Pulse (!)  102   Ht 5\' 8"  (1.727 m)   Wt 138 lb 1.9 oz (62.7 kg)   BMI 21.00 kg/m  , BMI Body mass index is 21 kg/m. General:Pleasant affect, NAD Skin:Warm and dry, brisk capillary refill HEENT:normocephalic, sclera clear, mucus membranes moist Neck:supple, no JVD, no bruits  Heart:S1S2 RRR and premature beats, without murmur, gallup, rub or click Lungs:clear to diminished breath sounds without rales, rhonchi,  or wheezes LTJ:QZES, non tender, + BS, do not palpate liver spleen or masses Ext:+ 2+ lower ext edema Lt> Rt ,  Greatest to above ankles then to knees 2+ radial pulses Neuro:alert and oriented X 3, MAE, follows commands, + facial symmetry    EKG:  EKG is ordered today. The ekg ordered today demonstrates ST with PACs runs of them or multifocal Atrial tach.     Recent Labs: 02/05/2017: ALT 19; Hemoglobin 11.7; Platelets 153 03/01/2017: BUN 27; Creatinine, Ser 1.16; Potassium 3.8; Sodium 137    Lipid Panel    Component Value Date/Time   CHOL 135 09/13/2015 0320   TRIG 45 09/13/2015 0320   HDL 74 09/13/2015 0320   CHOLHDL 1.8 09/13/2015 0320   VLDL 9 09/13/2015 0320   LDLCALC 52 09/13/2015 0320       Other studies Reviewed: Additional studies/ records that were reviewed today include:  Echo 2016 with Ef 50% mild MR, mild concentric LVH, mild TR.   ASSESSMENT AND PLAN:  1.  Volume overload - difficulty taking lasix with hypotension.  Will admit to OBS, and add IV lasix monitoring BP, check BNP as well.  Will check echo.  -  And eval for PE.    2.  Mild chest discomfort will check serial troponins   3.  CAD with hx CABG 2003 last echo 2016 with Ef 50%  4.   Severe COPD with home oxygen and trilogy at night.  5.   Chronic hypoxic respiratory failure on chronic steroids  6.  Chronic bil. Lower ext edema - increased today  7.   Anxiety chronic    Current medicines are reviewed with the patient today.  The patient Has no concerns regarding medicines.  The following  changes have been made:  See above Labs/ tests ordered today include:see above  Disposition:   FU:  see above  Signed, Cecilie Kicks, NP  03/08/2017 2:53 PM    [facility] [place], [place], [place], [place] Phone: [phone]; Fax: [phone]

## 2017-03-08 NOTE — ED Triage Notes (Signed)
Pt sent by cardiologist for evaluation of increased shortness of breath, and bilateral lower extremity swelling. Hx CHF, stopped taking lasix on Sunday due to decreased BP.

## 2017-03-08 NOTE — ED Notes (Signed)
Meal provided 

## 2017-03-08 NOTE — ED Notes (Signed)
Pt declines to have heparin started until he speaks to  EDP.  Dr. Ralene Bathe notified and is at bedside.

## 2017-03-08 NOTE — Patient Outreach (Signed)
[name]  03/08/2017  [name] January 31, 1929 [id number]    Transition of care (2nd week)  RN spoke with pt today and received permission to speak with his spouse [name]. RN again introduced the [facility] services and available programs. RN informed the spouse that RN has had a conversation with pt last week and the purpose for today's call related to ongoing transition of care contacts that pt agreed to participate in over the next few weeks. Spouse inquired if my if I was the nurse that is available 24hr for assistance for this pt with the [facility] agency. RN response I was not the available nurse but would be able to contact the agency if she needed them immediately to respond to pt's needs. Spouse indicated on yesterday she had called the agency to request a nurse and several hours had passed so when I call she thought I was from that agency and was upset with RN due to the delay. Once RN confirmed I was not from that agency just happen to call back during that wait period, she apologized. RN explained [facility] services once again and offered home visits if pt needs more of a one-on-one consult for his ongoing medical issues. Wife feels the ongoing transition of care calls are sufficient at this time. RN discussed the plan of care generated with the pt last week and reviewed the goals and interventions were adjusted according to pt's progress. Will continue to monitor pt on his progress and offered to continue with a follow up next week on pt's progress. Wife very appreciative and grateful for the call and RN provided a direct contact name and number if resources or issue arise prior to the next transition of care call.   [name], RN [name] Coordinator [place] Office [phone]

## 2017-03-08 NOTE — ED Notes (Signed)
Patient transported to X-ray 

## 2017-03-08 NOTE — ED Provider Notes (Signed)
[facility] Provider Note   CSN: [id number] Arrival date & time: 03/08/17  1535     History   Chief Complaint Chief Complaint  Patient presents with  . Shortness of Breath    HPI MANAS HICKLING is a 84 y.o. male.  The history is provided by the patient. No language interpreter was used.  Shortness of Breath    MARIOS GAISER is a 85 y.o. male who presents to the Emergency Department complaining of sob.  Resents for evaluation of shortness of breath and fatigue.  He was admitted to the hospital at the end of January for influenza.  Following discharge he was doing okay but over the last 3 days has not experienced increased palpitations with chest tightness with deep breaths as well as fatigue and malaise.  He noticed blood pressure was in the 80s on Sunday and he stopped taking his Lasix.  He does have associated increased lower extremity edema.  He has chronic bilateral lower extremity edema, left greater than right.  Symptoms are moderate, constant, worsening. Past Medical History:  Diagnosis Date  . Asthma   . BPH (benign prostatic hyperplasia)    severe; s/p multiple biopsies  . CAD (coronary artery disease)   . Carotid artery occlusion   . Chronic rhinitis   . COPD (chronic obstructive pulmonary disease) (HCC)    2L [place] O2  . Hyperlipidemia   . Hypertension   . On home oxygen therapy    "~ 24/7" (02/08/2017)  . Peripheral vascular disease ([place])   . Syncope and collapse   . Ureteral tumor 08/2015   had endoscopic procedure for evaluation, unable to reach for biopsy    Patient Active Problem List   Diagnosis Date Noted  . Tachycardia 03/08/2017  . Coronary artery disease involving native heart with angina pectoris ([place])   . Influenza A 02/04/2017  . Cor pulmonale, chronic (HCC)/ clinical dx  09/13/2016  . Orthostatic hypotension 09/26/2015  . Angina at rest [facility]) 09/24/2015  . BPH (benign prostatic hyperplasia) 09/13/2015  . GERD  (gastroesophageal reflux disease) 09/13/2015  . Anxiety 09/13/2015  . Sinusitis, chronic 09/09/2015  . Dyspnea 04/15/2015  . Acute heart failure ([place]) 03/18/2015  . Right calf pain 06/18/2014  . Coronary artery disease involving coronary bypass graft of native heart with angina pectoris ([place])   . Peripheral vascular disease ([place])   . Essential hypertension   . Obstructive sleep apnea   . Rhinitis, chronic 01/29/2007  . Chronic obstructive pulmonary disease ([place]) 01/29/2007  . Chronic respiratory failure with hypoxia and hypercapnia ([place]) 01/29/2007  . Hyperlipidemia     Past Surgical History:  Procedure Laterality Date  . CATARACT EXTRACTION  11-20-11  . CORONARY ARTERY BYPASS GRAFT  10/2001   "CABG X4"  . LITHOTRIPSY    . PROSTATE BIOPSY  Oct. 2014       Home Medications    Prior to Admission medications   Medication Sig Start Date End Date Taking? Authorizing Provider  ALPRAZolam (XANAX) 0.25 MG tablet Take 1 tablet (0.25 mg total) by mouth 3 (three) times daily. Takes 1/2 tablet two times daily. Patient taking differently: Take 0.25 mg by mouth 2 (two) times daily. Takes 1/2 tablet two times daily. 01/11/17  Yes Tanda Rockers, MD  aspirin 81 MG tablet Take 81 mg by mouth daily.   Yes [provider]  atorvastatin (LIPITOR) 20 MG tablet TAKE 1 TABLET BY MOUTH EVERY DAY Patient taking differently: TAKE  1 TABLET (20mg ) BY MOUTH EVERY DAY 05/24/16  Yes Belva Crome, MD  b complex vitamins tablet Take 1 tablet by mouth daily.   Yes [provider]  Cholecalciferol (VITAMIN D3) 2000 units TABS Take 2,000 Units by mouth daily.   Yes [provider]  Cyanocobalamin (VITAMIN B 12 PO) Take 1 tablet daily by mouth.    Yes [provider]  finasteride (PROSCAR) 5 MG tablet Take 5 mg by mouth every morning.   Yes [provider]  furosemide (LASIX) 20 MG tablet Take 1 tablet (20 mg total) by mouth daily. Can take one additional pill if  needed for swelling. 02/27/17  Yes Tanda Rockers, MD  guaiFENesin (MUCINEX) 600 MG 12 hr tablet Take 600 mg by mouth 2 (two) times daily as needed for cough.    Yes [provider]  mometasone (NASONEX) 50 MCG/ACT nasal spray SHAKE LIQUID AND USE 2 SPRAYS IN EACH NOSTRIL DAILY 12/25/16  Yes Tanda Rockers, MD  Multiple Vitamins-Minerals (PRESERVISION AREDS 2) CAPS Take 1 capsule by mouth daily.   Yes [provider]  nitroGLYCERIN (NITROSTAT) 0.4 MG SL tablet DISSOLVE 1 TABLET UNDER THE TONGUE AS DIRECTED AND AS NEEDED FOR CHEST PAIN 03/02/17  Yes Belva Crome, MD  OXYGEN Inhale 2 L continuous into the lungs.    Yes [provider]  predniSONE (DELTASONE) 10 MG tablet Take 10 mg by mouth daily with breakfast.   Yes [provider]  ranitidine (ZANTAC) 150 MG tablet Take 150 mg by mouth 2 (two) times daily.   Yes [provider]  Sodium Chloride, Hypertonic, (MURO 128 OP) Place 1 drop 4 (four) times daily into both eyes.   Yes [provider]  SPIRIVA RESPIMAT 2.5 MCG/ACT AERS INHALE 2 PUFFS BY MOUTH EVERY DAY 10/05/16  Yes Tanda Rockers, MD  SYMBICORT 160-4.5 MCG/ACT inhaler INHALE 2 PUFFS BY MOUTH EVERY MORNING AND THEN ANOTHER 2 PUFFS ABOUT 12 HOURS LATER 11/15/16  Yes Tanda Rockers, MD  TOPROL XL 25 MG 24 hr tablet TAKE 1 TABLET BY MOUTH DAILY Patient taking differently: TAKE 1 TABLET (25mg ) BY MOUTH DAILY 04/25/16  Yes Belva Crome, MD  XOPENEX HFA 45 MCG/ACT inhaler INHALE 2 PUFFS BY MOUTH EVERY 4 HOURS AS NEEDED FOR WHEEZING OR SHORTNESS OF BREATH 10/05/16  Yes Tanda Rockers, MD    Family History Family History  Problem Relation Age of Onset  . Heart disease Mother   . Breast cancer Mother   . Cancer Mother        Breast cancer  . Hypertension Mother   . Other Mother        AAA  and   Amputation  . Heart disease Father        Before age 66  . Heart attack Father   . Stroke Brother        x3, still living   . Peripheral  vascular disease Brother   . Heart disease Brother   . Hyperlipidemia Brother   . Hypertension Brother   . Heart disease Brother   . Allergies Brother     Social History Social History   Tobacco Use  . Smoking status: Former Smoker    Packs/day: 2.00    Years: 33.00    Pack years: 66.00    Types: Cigarettes    Last attempt to quit: 01/09/1982    Years since quitting: 35.1  . Smokeless tobacco: Never Used  Substance Use  Topics  . Alcohol use: Yes    Alcohol/week: 0.0 oz    Comment: 1.5-3 oz daily in the past, minimal use in the last few months  . Drug use: No     Allergies   Sulfonamide derivatives; Cefdinir; Ciprofloxacin; Nitrofurantoin; and Levaquin [levofloxacin]   Review of Systems Review of Systems  Respiratory: Positive for shortness of breath.   All other systems reviewed and are negative.    Physical Exam Updated Vital Signs BP 107/67 (BP Location: Left Arm)   Pulse 78   Temp 98.5 F (36.9 C) (Oral)   Resp 20   Ht 5\' 8"  (1.727 m)   Wt 59.1 kg (130 lb 6.4 oz)   SpO2 99%   BMI 19.83 kg/m   Physical Exam  Constitutional: He is oriented to person, place, and time. He appears well-developed and well-nourished.  HENT:  Head: Normocephalic and atraumatic.  Cardiovascular: Normal rate and regular rhythm.  No murmur heard. Pulmonary/Chest: Effort normal. No respiratory distress.  Decreased air movement bilaterally  Abdominal: Soft. There is no tenderness. There is no rebound and no guarding.  Musculoskeletal: He exhibits no tenderness.  3+ Pitting edema to LLE, 2+ pitting edema to RLE  Neurological: He is alert and oriented to person, place, and time.  Skin: Skin is warm and dry.  Psychiatric: He has a normal mood and affect. His behavior is normal.  Nursing note and vitals reviewed.    ED Treatments / Results  Labs (all labs ordered are listed, but only abnormal results are displayed) Labs Reviewed  BASIC METABOLIC PANEL - Abnormal; Notable for  the following components:      Result Value   Chloride 97 (*)    Glucose, Bld 193 (*)    GFR calc non Af Amer 57 (*)    All other components within normal limits  CBC - Abnormal; Notable for the following components:   WBC 13.8 (*)    All other components within normal limits  TROPONIN I - Abnormal; Notable for the following components:   Troponin I 0.33 (*)    All other components within normal limits  BRAIN NATRIURETIC PEPTIDE - Abnormal; Notable for the following components:   B Natriuretic Peptide 237.5 (*)    All other components within normal limits  URINALYSIS, ROUTINE W REFLEX MICROSCOPIC - Abnormal; Notable for the following components:   Color, Urine COLORLESS (*)    All other components within normal limits  I-STAT TROPONIN, ED - Abnormal; Notable for the following components:   Troponin i, poc 0.29 (*)    All other components within normal limits  TSH  TROPONIN I  TROPONIN I  TROPONIN I  MAGNESIUM  LIPID PANEL  PROTIME-INR  HEPARIN LEVEL (UNFRACTIONATED)  CBC  BASIC METABOLIC PANEL    EKG  EKG Interpretation  Date/Time:  Thursday March 08 2017 15:41:44 EST Ventricular Rate:  109 PR Interval:  154 QRS Duration: 142 QT Interval:  358 QTC Calculation: 482 R Axis:   154 Text Interpretation:  Sinus tachycardia with Premature atrial complexes Right bundle branch block Abnormal ECG Confirmed by Quintella Reichert [phone]) on 03/08/2017 4:26:26 PM       Radiology Dg Chest 2 View  Result Date: 03/08/2017 CLINICAL DATA:  Chest pain and shortness of breath. EXAM: CHEST  2 VIEW COMPARISON:  Chest x-ray dated February 14, 2017. FINDINGS: The heart size and mediastinal contours are within normal limits. Prior CABG. Normal pulmonary vascularity. Atherosclerotic calcification of the aortic arch. The lungs remain  hyperinflated. No focal consolidation, pleural effusion, or pneumothorax. No acute osseous abnormality. IMPRESSION: 1. COPD.  No active cardiopulmonary disease.  Electronically Signed   By: Titus Dubin M.D.   On: 03/08/2017 17:14   Ct Angio Chest Pe W/cm &/or Wo Cm  Result Date: 03/08/2017 CLINICAL DATA:  Intermittent chest pain. Palpitations. History of COPD, hypertension, hyperlipidemia. EXAM: CT ANGIOGRAPHY CHEST WITH CONTRAST TECHNIQUE: Multidetector CT imaging of the chest was performed using the standard protocol during bolus administration of intravenous contrast. Multiplanar CT image reconstructions and MIPs were obtained to evaluate the vascular anatomy. CONTRAST:  <See Chart> ISOVUE-370 IOPAMIDOL (ISOVUE-370) INJECTION 76% COMPARISON:  Chest radiograph March 08, 2017 FINDINGS: CARDIOVASCULAR: Adequate contrast opacification of the pulmonary artery's. Main pulmonary artery is not enlarged. Mild stenosis versus respiratory motion through pulmonary arterial origin. No pulmonary arterial filling defects to the level of the subsegmental branches. Heart size is normal, no right heart strain. No pericardial effusion. Thoracic aorta is normal course and caliber, moderate calcific atherosclerosis aortic arch. Severe coronary artery calcifications. MEDIASTINUM/NODES: No lymphadenopathy by CT size criteria. LUNGS/PLEURA: Tracheobronchial tree is patent, no pneumothorax. No pleural effusions, focal consolidations, pulmonary nodules or masses. Punctate calcified granulomas. Biapical pleuroparenchymal scarring. Mild RIGHT apical bullous changes. Moderate to severe centrilobular emphysema. Bibasilar atelectasis/scarring. UPPER ABDOMEN: Mild LEFT hydronephrosis. RIGHT extra renal pelvis. Punctate RIGHT nephrolithiasis. Mildly thickened adrenal glands associated with hyperplasia. MUSCULOSKELETAL: Old median sternotomy. Severe LEFT greater than RIGHT glenohumeral osteoarthrosis. Osteopenia. Multilevel Schmorl's nodes. Review of the MIP images confirms the above findings. IMPRESSION: 1. No acute pulmonary embolism. 2. Emphysema with bibasilar atelectasis/scarring. 3. Mild  LEFT hydronephrosis. Aortic Atherosclerosis (ICD10-I70.0) and Emphysema (ICD10-J43.9). Electronically Signed   By: Elon Alas M.D.   On: 03/08/2017 18:42    Procedures Procedures (including critical care time)  Medications Ordered in ED Medications  ALPRAZolam Duanne Moron) tablet 0.25 mg (0.25 mg Oral Given 03/08/17 2131)  aspirin EC tablet 81 mg (not administered)  atorvastatin (LIPITOR) tablet 20 mg (20 mg Oral Given 03/08/17 2131)  cholecalciferol (VITAMIN D) tablet 2,000 Units (not administered)  finasteride (PROSCAR) tablet 5 mg (not administered)  fluticasone (FLONASE) 50 MCG/ACT nasal spray 2 spray (2 sprays Each Nare Given 03/08/17 2131)  multivitamin-lutein (OCUVITE-LUTEIN) capsule 1 capsule (not administered)  nitroGLYCERIN (NITROSTAT) SL tablet 0.4 mg (not administered)  predniSONE (DELTASONE) tablet 10 mg (not administered)  famotidine (PEPCID) tablet 20 mg (not administered)  tiotropium (SPIRIVA) inhalation capsule 18 mcg (not administered)  mometasone-formoterol (DULERA) 200-5 MCG/ACT inhaler 2 puff (2 puffs Inhalation Not Given 03/08/17 2132)  metoprolol succinate (TOPROL-XL) 24 hr tablet 25 mg (not administered)  aspirin chewable tablet 324 mg (324 mg Oral Not Given 03/08/17 2056)    Or  aspirin suppository 300 mg ( Rectal See Alternative 03/08/17 2056)  acetaminophen (TYLENOL) tablet 650 mg (not administered)  ondansetron (ZOFRAN) injection 4 mg (not administered)  0.9 %  sodium chloride infusion ( Intravenous New Bag/Given 03/08/17 1848)  furosemide (LASIX) injection 40 mg (40 mg Intravenous Given 03/08/17 1736)  heparin ADULT infusion 100 units/mL (25000 units/257mL sodium chloride 0.45%) (750 Units/hr Intravenous New Bag/Given 03/08/17 1900)  B-complex with vitamin C tablet 1 tablet (not administered)  vitamin B-12 (CYANOCOBALAMIN) tablet 100 mcg (not administered)  levalbuterol (XOPENEX) nebulizer solution 1.25 mg (not administered)  aspirin chewable tablet 324 mg (324  mg Oral Given 03/08/17 1737)  levalbuterol (XOPENEX) nebulizer solution 0.63 mg (0.63 mg Nebulization Given 03/08/17 1736)  iopamidol (ISOVUE-370) 76 % injection (100 mLs  Contrast Given 03/08/17 1753)  heparin bolus via infusion 3,750 Units (3,750 Units Intravenous Bolus from Bag 03/08/17 1900)     Initial Impression / Assessment and Plan / ED Course  I have reviewed the triage vital signs and the nursing notes.  Pertinent labs & imaging results that were available during my care of the patient were reviewed by me and considered in my medical decision making (see chart for details).     Patient with history of COPD and coronary artery disease here for evaluation of pleuritic chest pain, shortness of breath.  Given asymmetric lower extremity edema, recent hospitalization CT PE study was obtained that was negative for acute PE.  Troponin is elevated compared to priors.  Cardiology will admit for ongoing management.    Final Clinical Impressions(s) / ED Diagnoses   Final diagnoses:  None    ED Discharge Orders    None       Quintella Reichert, MD 03/09/17 [phone]

## 2017-03-08 NOTE — H&P (Addendum)
HISTORY AND PHYSICAL    Date:  03/08/2017   ID:  [name], DOB 18-May-1929, MRN [id number]  PCP:  [name], MD           Cardiologist:  Dr. Tamala Julian         Chief Complaint  Patient presents with  . Leg Swelling    weakness, palpatatins      History of Present Illness: [name] is a 84 y.o. male who presents for edema, after flu last month.  Also palpitations.   Also orthostatic hypotension.  After 3 days of lasix --though he has had this in the past.   Last seen by Dr. Tamala Julian 11/14/16    CAD with prior coronary bypass grafting, severe COPD with cor pulmonale, FEV1 0.58 in 2017, on 2 L oxygen and nocturnal ventilation, steroid dependent with home oxygen,uses trilogy machine,  hyperlipidemia, hypertension, PAD, and history of carotid obstructive disease, hx RBBB  Pt was hospitalized with influenza A,  ST with RBBB on EKG and troponin poc was neg.  When he came home he was doing well but this week he has felt very weak.  Can hardly get around.  He was taking his lasix but then BP would drop to 80 systolic, so he has not had since the 23rd.  His wt is up 3 lbs today.   Unsure why sudden change in condition.  His HR is rapid and with premature beats vs. PACs.  Some chest discomfort but with deep breaths and turning head back and forth it resolves.   He is not afraid to go home but is concerned but his wife is more afraid.    He is now off antibiotics, and did not tolerate the spironolactone.  Made him feel bad.  On oxygen 24/7 and triology at night.  He was on higher dose of steroids but now back to baseline.      Past Medical History:  Diagnosis Date  . Asthma   . BPH (benign prostatic hyperplasia)    severe; s/p multiple biopsies  . CAD (coronary artery disease)   . Carotid artery occlusion   . Chronic rhinitis   . COPD (chronic obstructive pulmonary disease) (HCC)    2L [place] O2  . Hyperlipidemia   . Hypertension   . On home oxygen therapy     "~ 24/7" (02/08/2017)  . Peripheral vascular disease ([place])   . Syncope and collapse   . Ureteral tumor 08/2015   had endoscopic procedure for evaluation, unable to reach for biopsy         Past Surgical History:  Procedure Laterality Date  . CATARACT EXTRACTION  11-20-11  . CORONARY ARTERY BYPASS GRAFT  10/2001   "CABG X4"  . LITHOTRIPSY    . PROSTATE BIOPSY  Oct. 2014     Current Outpatient Medications  Medication Sig Dispense Refill  . ALPRAZolam (XANAX) 0.25 MG tablet Take 1 tablet (0.25 mg total) by mouth 3 (three) times daily. Takes 1/2 tablet two times daily. 90 tablet 2  . aspirin 81 MG tablet Take 81 mg by mouth daily.    Marland Kitchen atorvastatin (LIPITOR) 20 MG tablet TAKE 1 TABLET BY MOUTH EVERY DAY 90 tablet 2  . b complex vitamins tablet Take 1 tablet by mouth daily.    . Cholecalciferol (VITAMIN D3) 2000 units TABS Take 2,000 Units by mouth daily.    . Cyanocobalamin (VITAMIN B 12 PO) Take 1 tablet daily by mouth.     Marland Kitchen  finasteride (PROSCAR) 5 MG tablet Take 5 mg by mouth every morning.    Marland Kitchen guaiFENesin (MUCINEX) 600 MG 12 hr tablet Take by mouth 2 (two) times daily as needed.    . mometasone (NASONEX) 50 MCG/ACT nasal spray SHAKE LIQUID AND USE 2 SPRAYS IN EACH NOSTRIL DAILY 17 g 11  . Multiple Vitamins-Minerals (PRESERVISION AREDS 2) CAPS Take 1 capsule by mouth daily.    . nitroGLYCERIN (NITROSTAT) 0.4 MG SL tablet DISSOLVE 1 TABLET UNDER THE TONGUE AS DIRECTED AND AS NEEDED FOR CHEST PAIN 25 tablet 4  . OXYGEN Inhale 2 L continuous into the lungs.     . predniSONE (DELTASONE) 10 MG tablet Take 10 mg by mouth daily with breakfast.    . ranitidine (ZANTAC) 150 MG tablet Take 150 mg by mouth 2 (two) times daily.    . Sodium Chloride, Hypertonic, (MURO 128 OP) Place 1 drop 4 (four) times daily into both eyes.    Marland Kitchen SPIRIVA RESPIMAT 2.5 MCG/ACT AERS INHALE 2 PUFFS BY MOUTH EVERY DAY 4 g 11  . SYMBICORT 160-4.5 MCG/ACT inhaler INHALE 2 PUFFS  BY MOUTH EVERY MORNING AND THEN ANOTHER 2 PUFFS ABOUT 12 HOURS LATER 1 Inhaler 6  . TOPROL XL 25 MG 24 hr tablet TAKE 1 TABLET BY MOUTH DAILY 30 tablet 11  . XOPENEX HFA 45 MCG/ACT inhaler INHALE 2 PUFFS BY MOUTH EVERY 4 HOURS AS NEEDED FOR WHEEZING OR SHORTNESS OF BREATH 15 g 11  . furosemide (LASIX) 20 MG tablet Take 1 tablet (20 mg total) by mouth daily. Can take one additional pill if needed for swelling. (Patient not taking: Reported on 03/08/2017) 45 tablet 1  . spironolactone (ALDACTONE) 50 MG tablet Take 1 tablet (50 mg total) by mouth daily. (Patient not taking: Reported on 03/08/2017) 30 tablet 2   No current facility-administered medications for this visit.     Allergies:   Sulfonamide derivatives; Cefdinir; Ciprofloxacin; Nitrofurantoin; and Levaquin [levofloxacin]    Social History:  The patient  reports that he quit smoking about 35 years ago. His smoking use included cigarettes. He has a 66.00 pack-year smoking history. he has never used smokeless tobacco. He reports that he drinks alcohol. He reports that he does not use drugs.   Family History:  The patient's family history includes Allergies in his brother; Breast cancer in his mother; Cancer in his mother; Heart attack in his father; Heart disease in his brother, brother, father, and mother; Hyperlipidemia in his brother; Hypertension in his brother and mother; Other in his mother; Peripheral vascular disease in his brother; Stroke in his brother.    ROS:  General:no colds or fevers, + weight gain Skin:no rashes or ulcers HEENT:no blurred vision, no congestion, stable SOB CV:see HPI PUL:see HPI GI:no diarrhea constipation or melena, no indigestion GU:no hematuria, no dysuria MS:no joint pain, no claudication Neuro:no syncope, no lightheadedness, though so weak he feels he could pass out Endo:no diabetes, no thyroid disease     Wt Readings from Last 3 Encounters:  03/08/17 138 lb 1.9 oz (62.7 kg)  03/01/17 134  lb (60.8 kg)  02/20/17 135 lb (61.2 kg)     PHYSICAL EXAM: VS:  BP 122/70   Pulse (!) 102   Ht 5\' 8"  (1.727 m)   Wt 138 lb 1.9 oz (62.7 kg)   BMI 21.00 kg/m  , BMI Body mass index is 21 kg/m. General:Pleasant affect, NAD Skin:Warm and dry, brisk capillary refill HEENT:normocephalic, sclera clear, mucus membranes moist Neck:supple, no JVD,  no bruits  Heart:S1S2 RRR and premature beats, without murmur, gallup, rub or click Lungs:clear to diminished breath sounds without rales, rhonchi, or wheezes WUJ:WJXB, non tender, + BS, do not palpate liver spleen or masses Ext:+ 2+ lower ext edema Lt> Rt ,  Greatest to above ankles then to knees 2+ radial pulses Neuro:alert and oriented X 3, MAE, follows commands, + facial symmetry    EKG:  EKG is ordered today. The ekg ordered today demonstrates ST with PACs runs of them or multifocal Atrial tach.     Recent Labs: 02/05/2017: ALT 19; Hemoglobin 11.7; Platelets 153 03/01/2017: BUN 27; Creatinine, Ser 1.16; Potassium 3.8; Sodium 137    Lipid Panel Labs(Brief)          Component Value Date/Time   CHOL 135 09/13/2015 0320   TRIG 45 09/13/2015 0320   HDL 74 09/13/2015 0320   CHOLHDL 1.8 09/13/2015 0320   VLDL 9 09/13/2015 0320   LDLCALC 52 09/13/2015 0320         Other studies Reviewed: Additional studies/ records that were reviewed today include:  Echo 2016 with Ef 50% mild MR, mild concentric LVH, mild TR.   ASSESSMENT AND PLAN:  1.  Volume overload - difficulty taking lasix with hypotension.  Will admit to OBS, and add IV lasix monitoring BP, check BNP as well.  Will check echo.  -  And eval for PE.    2.  Mild chest discomfort will check serial troponins   3.  CAD with hx CABG 2003 last echo 2016 with Ef 50%  4.   Severe COPD with home oxygen and trilogy at night.  5.   Chronic hypoxic respiratory failure on chronic steroids  6.  Chronic bil. Lower ext edema - increased today  7.    Anxiety chronic    Current medicines are reviewed with the patient today.  The patient Has no concerns regarding medicines.  The following changes have been made:  See above Labs/ tests ordered today include:see above  Disposition:   FU:  see above  Signed, Cecilie Kicks, NP  03/08/2017 2:53 PM    [facility] [address] [place], [place] Phone: [phone]; Fax: [phone]  [phone]           I have seen and examined the patient and agree with the findings and plan, as documented in the PA/NP's note, with the following additions/changes.  Mr. Sedgwick is an 84 year old man with history of coronary artery disease status post remote CABG, advanced COPD on chronic oxygen, and recent hospitalization for influenza last month. He reports that he was feeling much better and gradually improving in strength for about 10 days after hospital discharge. He then abruptly noted increasing leg swelling, dyspnea, and fatigue. He also feels vague discomfort across his chest. He is concerned that his symptoms are related to stress. However, his wife, who accompanies him today, reports that Mr. Paxton has been progressively declining over the last week to 10 days. Of note, he discontinued furosemide 5 days ago due to hypotension with systolic readings in the 44W.  Physical exam is notable for a thin elderly man lying on the exam table. He is wearing supplemental oxygen. Breath sounds are diffusely diminished without wheezes or crackles. Heart sounds are irregularly irregular without murmurs. Abdomen is soft. There is 2+ pitting edema in both lower extremities despite compression stockings be an in place. There is  no significant JVD.  EKG, personally reviewed, demonstrates likely multifocal atrial tachycardia and right bundle branch block.  In summary, Mr. Cardinal is an 84 year old man with recent hospitalization for  influenza, presenting with progressive decline including increasing shortness of breath, edema, and atypical chest pain. There are several potential etiologies for his decline, including worsening heart failure in the setting of recent discontinuation of furosemide, acute coronary syndrome, and pulmonary embolism. Given the patient's age, frailty, and significant symptoms, he has been admitted for further workup. We will trend troponins and also check a CTA of the chest to exclude pulmonary embolism. Gentle diuresis, as blood pressure allows, should be undertaken. Repeat echocardiogram will be ordered; last LVEF was 50% in 2016.  Nelva Bush, MD [facility] Pager: [phone]

## 2017-03-08 NOTE — ED Notes (Signed)
Patient transported to CT 

## 2017-03-08 NOTE — ED Notes (Signed)
Heart healthy carb modified dinner tray ordered 

## 2017-03-08 NOTE — Progress Notes (Signed)
ANTICOAGULATION CONSULT NOTE - Initial Consult  Pharmacy Consult for heparin Indication: chest pain/ACS  Allergies  Allergen Reactions  . Sulfonamide Derivatives Swelling    REACTION: facial/tongue swelling  . Cefdinir Diarrhea    REACTION: diarrhea  . Ciprofloxacin Itching and Rash    Red itchy hands  . Nitrofurantoin Swelling    Swollen hands  . Levaquin [Levofloxacin] Itching and Rash    Patient Measurements: Weight: 138 lb (62.6 kg) Heparin Dosing Weight: 62.7 kg  Vital Signs: Temp: 97.7 F (36.5 C) (02/28 1543) Temp Source: Oral (02/28 1543) BP: 141/94 (02/28 1700) Pulse Rate: 85 (02/28 1700)  Labs: Recent Labs    03/08/17 1545  HGB 13.7  HCT 41.7  PLT 199  CREATININE 1.13    Estimated Creatinine Clearance: 42.3 mL/min (by C-G formula based on SCr of 1.13 mg/dL).   Medical History: Past Medical History:  Diagnosis Date  . Asthma   . BPH (benign prostatic hyperplasia)    severe; s/p multiple biopsies  . CAD (coronary artery disease)   . Carotid artery occlusion   . Chronic rhinitis   . COPD (chronic obstructive pulmonary disease) (HCC)    2L [place] O2  . Hyperlipidemia   . Hypertension   . On home oxygen therapy    "~ 24/7" (02/08/2017)  . Peripheral vascular disease ([place])   . Syncope and collapse   . Ureteral tumor 08/2015   had endoscopic procedure for evaluation, unable to reach for biopsy    Medications:  Scheduled:  . atorvastatin  20 mg Oral Daily  . b complex vitamins  1 tablet Oral Daily  . [START ON 03/09/2017] finasteride  5 mg Oral q morning - 10a  . furosemide  40 mg Intravenous BID  . heparin  3,750 Units Intravenous Once   Assessment: 75 yom presenting with increased SOB and bilateral lower extremity swelling with hx of EF 50%.  Pharmacy consulted to start heparin for ACS/STEMI.  No anticoagulants PTA. Hgb is 13.7, plt WNL. Trop is 0.29. No signs/symptoms of bleeding.   Goal of Therapy:  Heparin level 0.3-0.7 units/ml Monitor  platelets by anticoagulation protocol: Yes   Plan:  Give 3750 units bolus x 1 Start heparin infusion at 750 units/hr Check anti-Xa level in 8 hours and daily while on heparin Continue to monitor H&H and platelets  Doylene Canard, PharmD Clinical Pharmacist  Pager: [phone] Phone: [phone] 03/08/2017,6:27 PM

## 2017-03-09 ENCOUNTER — Encounter (HOSPITAL_COMMUNITY): Payer: Self-pay | Admitting: Physician Assistant

## 2017-03-09 ENCOUNTER — Observation Stay (HOSPITAL_BASED_OUTPATIENT_CLINIC_OR_DEPARTMENT_OTHER): Payer: Medicare Other

## 2017-03-09 DIAGNOSIS — R7989 Other specified abnormal findings of blood chemistry: Secondary | ICD-10-CM

## 2017-03-09 DIAGNOSIS — I214 Non-ST elevation (NSTEMI) myocardial infarction: Secondary | ICD-10-CM | POA: Diagnosis not present

## 2017-03-09 DIAGNOSIS — T502X5A Adverse effect of carbonic-anhydrase inhibitors, benzothiadiazides and other diuretics, initial encounter: Secondary | ICD-10-CM | POA: Diagnosis present

## 2017-03-09 DIAGNOSIS — I4510 Unspecified right bundle-branch block: Secondary | ICD-10-CM | POA: Diagnosis present

## 2017-03-09 DIAGNOSIS — I2583 Coronary atherosclerosis due to lipid rich plaque: Secondary | ICD-10-CM

## 2017-03-09 DIAGNOSIS — N1330 Unspecified hydronephrosis: Secondary | ICD-10-CM | POA: Diagnosis present

## 2017-03-09 DIAGNOSIS — E785 Hyperlipidemia, unspecified: Secondary | ICD-10-CM | POA: Diagnosis present

## 2017-03-09 DIAGNOSIS — Z9981 Dependence on supplemental oxygen: Secondary | ICD-10-CM | POA: Diagnosis not present

## 2017-03-09 DIAGNOSIS — I5041 Acute combined systolic (congestive) and diastolic (congestive) heart failure: Secondary | ICD-10-CM | POA: Diagnosis not present

## 2017-03-09 DIAGNOSIS — F419 Anxiety disorder, unspecified: Secondary | ICD-10-CM | POA: Diagnosis present

## 2017-03-09 DIAGNOSIS — I471 Supraventricular tachycardia: Secondary | ICD-10-CM | POA: Diagnosis present

## 2017-03-09 DIAGNOSIS — I2510 Atherosclerotic heart disease of native coronary artery without angina pectoris: Secondary | ICD-10-CM | POA: Diagnosis not present

## 2017-03-09 DIAGNOSIS — I2781 Cor pulmonale (chronic): Secondary | ICD-10-CM | POA: Diagnosis present

## 2017-03-09 DIAGNOSIS — R748 Abnormal levels of other serum enzymes: Secondary | ICD-10-CM | POA: Diagnosis not present

## 2017-03-09 DIAGNOSIS — R600 Localized edema: Secondary | ICD-10-CM | POA: Diagnosis present

## 2017-03-09 DIAGNOSIS — I248 Other forms of acute ischemic heart disease: Secondary | ICD-10-CM | POA: Diagnosis present

## 2017-03-09 DIAGNOSIS — I071 Rheumatic tricuspid insufficiency: Secondary | ICD-10-CM | POA: Diagnosis present

## 2017-03-09 DIAGNOSIS — J9611 Chronic respiratory failure with hypoxia: Secondary | ICD-10-CM | POA: Diagnosis not present

## 2017-03-09 DIAGNOSIS — I110 Hypertensive heart disease with heart failure: Secondary | ICD-10-CM | POA: Diagnosis not present

## 2017-03-09 DIAGNOSIS — I361 Nonrheumatic tricuspid (valve) insufficiency: Secondary | ICD-10-CM

## 2017-03-09 DIAGNOSIS — R079 Chest pain, unspecified: Secondary | ICD-10-CM | POA: Diagnosis not present

## 2017-03-09 DIAGNOSIS — I951 Orthostatic hypotension: Secondary | ICD-10-CM | POA: Diagnosis not present

## 2017-03-09 DIAGNOSIS — N400 Enlarged prostate without lower urinary tract symptoms: Secondary | ICD-10-CM | POA: Diagnosis present

## 2017-03-09 DIAGNOSIS — R778 Other specified abnormalities of plasma proteins: Secondary | ICD-10-CM

## 2017-03-09 DIAGNOSIS — Z7952 Long term (current) use of systemic steroids: Secondary | ICD-10-CM | POA: Diagnosis not present

## 2017-03-09 DIAGNOSIS — J438 Other emphysema: Secondary | ICD-10-CM | POA: Diagnosis not present

## 2017-03-09 DIAGNOSIS — Z7982 Long term (current) use of aspirin: Secondary | ICD-10-CM | POA: Diagnosis not present

## 2017-03-09 DIAGNOSIS — I6529 Occlusion and stenosis of unspecified carotid artery: Secondary | ICD-10-CM | POA: Diagnosis present

## 2017-03-09 DIAGNOSIS — I5033 Acute on chronic diastolic (congestive) heart failure: Secondary | ICD-10-CM

## 2017-03-09 DIAGNOSIS — J449 Chronic obstructive pulmonary disease, unspecified: Secondary | ICD-10-CM | POA: Diagnosis not present

## 2017-03-09 DIAGNOSIS — R000 Tachycardia, unspecified: Secondary | ICD-10-CM | POA: Diagnosis not present

## 2017-03-09 DIAGNOSIS — I739 Peripheral vascular disease, unspecified: Secondary | ICD-10-CM | POA: Diagnosis present

## 2017-03-09 DIAGNOSIS — I952 Hypotension due to drugs: Secondary | ICD-10-CM | POA: Diagnosis present

## 2017-03-09 HISTORY — DX: Other specified abnormalities of plasma proteins: R77.8

## 2017-03-09 HISTORY — DX: Other specified abnormal findings of blood chemistry: R79.89

## 2017-03-09 LAB — ECHOCARDIOGRAM COMPLETE
Area-P 1/2: 3.61 cm2
E decel time: 208 msec
E/e' ratio: 9.06
FS: 31 % (ref 28–44)
Height: 68 in
IVS/LV PW RATIO, ED: 0.97
LA ID, A-P, ES: 23 mm
LA diam end sys: 23 mm
LA diam index: 1.39 cm/m2
LV E/e' medial: 9.06
LV E/e'average: 9.06
LV PW d: 9.86 mm — AB (ref 0.6–1.1)
LV e' LATERAL: 6.31 cm/s
LVOT area: 3.46 cm2
LVOT diameter: 21 mm
MV Dec: 208
MV pk A vel: 65.5 m/s
MV pk E vel: 57.2 m/s
P 1/2 time: 61 ms
PV Reg grad dias: 5 mmHg
PV Reg vel dias: 115 cm/s
Reg peak vel: 202 cm/s
TDI e' lateral: 6.31
TDI e' medial: 7.07
TR max vel: 202 cm/s
Weight: 2041.6 oz

## 2017-03-09 LAB — MAGNESIUM: Magnesium: 2.2 mg/dL (ref 1.7–2.4)

## 2017-03-09 LAB — HEPARIN LEVEL (UNFRACTIONATED)
Heparin Unfractionated: 0.36 IU/mL (ref 0.30–0.70)
Heparin Unfractionated: 0.31 IU/mL (ref 0.30–0.70)

## 2017-03-09 LAB — CBC
HCT: 37.1 % — ABNORMAL LOW (ref 39.0–52.0)
Hemoglobin: 12.3 g/dL — ABNORMAL LOW (ref 13.0–17.0)
MCH: 32 pg (ref 26.0–34.0)
MCHC: 33.2 g/dL (ref 30.0–36.0)
MCV: 96.6 fL (ref 78.0–100.0)
Platelets: 180 10*3/uL (ref 150–400)
RBC: 3.84 MIL/uL — ABNORMAL LOW (ref 4.22–5.81)
RDW: 12.9 % (ref 11.5–15.5)
WBC: 9.2 10*3/uL (ref 4.0–10.5)

## 2017-03-09 LAB — PROTIME-INR
INR: 1.16
Prothrombin Time: 14.7 seconds (ref 11.4–15.2)

## 2017-03-09 LAB — LIPID PANEL
Cholesterol: 173 mg/dL (ref 0–200)
HDL: 81 mg/dL (ref >40)
LDL Cholesterol: 86 mg/dL (ref 0–99)
Total CHOL/HDL Ratio: 2.1 RATIO
Triglycerides: 32 mg/dL (ref <150)
VLDL: 6 mg/dL (ref 0–40)

## 2017-03-09 LAB — TROPONIN I
Troponin I: 0.52 ng/mL (ref <0.03)
Troponin I: 0.54 ng/mL (ref <0.03)

## 2017-03-09 LAB — GLUCOSE, CAPILLARY
Glucose-Capillary: 74 mg/dL (ref 65–99)
Glucose-Capillary: 116 mg/dL — ABNORMAL HIGH (ref 65–99)
Glucose-Capillary: 90 mg/dL (ref 65–99)

## 2017-03-09 LAB — BASIC METABOLIC PANEL
Anion gap: 10 (ref 5–15)
BUN: 17 mg/dL (ref 6–20)
CO2: 30 mmol/L (ref 22–32)
Calcium: 8.8 mg/dL — ABNORMAL LOW (ref 8.9–10.3)
Chloride: 98 mmol/L — ABNORMAL LOW (ref 101–111)
Creatinine, Ser: 1.15 mg/dL (ref 0.61–1.24)
GFR calc Af Amer: >60 mL/min (ref >60)
GFR calc non Af Amer: 56 mL/min — ABNORMAL LOW (ref >60)
Glucose, Bld: 106 mg/dL — ABNORMAL HIGH (ref 65–99)
Potassium: 3.9 mmol/L (ref 3.5–5.1)
Sodium: 138 mmol/L (ref 135–145)

## 2017-03-09 MED ORDER — PROSIGHT PO TABS
1.0000 | ORAL_TABLET | Freq: Every day | ORAL | Status: DC
  Administered 2017-03-09 – 2017-03-12 (×4): 1 via ORAL
  Filled 2017-03-09 (×4): qty 1

## 2017-03-09 NOTE — Consult Note (Signed)
   [facility] CM Inpatient Consult   03/09/2017  [name] 1929-06-04 [id number]  Patient is currently active with [facility] Care Management for chronic disease management services.  Patient has been engaged by a [employer] and CSW.  Our community based plan of care has focused on disease management and community resource support.  Patient will receive a post discharge transition of care call and will be evaluated for monthly home visits for assessments and disease process education.  Came by to see patient and patient is in the middle of a procedure. Made Inpatient Case Manager aware that [name] following. Of note, [facility] Care Management services does not replace or interfere with any services that are needed or arranged by inpatient case management or social work.  For additional questions or referrals please contact:  Natividad Brood, RN BSN [facility] Liaison  [phone] business mobile phone Toll free office [phone]

## 2017-03-09 NOTE — Progress Notes (Signed)
[place] for heparin Indication: chest pain/ACS  Allergies  Allergen Reactions  . Sulfonamide Derivatives Swelling    REACTION: facial/tongue swelling  . Cefdinir Diarrhea    REACTION: diarrhea  . Ciprofloxacin Itching and Rash    Red itchy hands  . Nitrofurantoin Swelling    Swollen hands  . Levaquin [Levofloxacin] Itching and Rash    Patient Measurements: Height: 5\' 8"  (172.7 cm) Weight: 130 lb 6.4 oz (59.1 kg) IBW/kg (Calculated) : 68.4 Heparin Dosing Weight: 62.7 kg  Vital Signs: Temp: 98.5 F (36.9 C) (03/01 0032) Temp Source: Oral (03/01 0032) BP: 107/67 (03/01 0032) Pulse Rate: 78 (03/01 0032)  Labs: Recent Labs    03/08/17 1545 03/08/17 1727 03/09/17 0139  HGB 13.7  --  12.3*  HCT 41.7  --  37.1*  PLT 199  --  180  LABPROT  --   --  14.7  INR  --   --  1.16  HEPARINUNFRC  --   --  0.36  CREATININE 1.13  --   --   TROPONINI  --  0.33*  --     Estimated Creatinine Clearance: 40 mL/min (by C-G formula based on SCr of 1.13 mg/dL).   Medical History: Past Medical History:  Diagnosis Date  . Asthma   . BPH (benign prostatic hyperplasia)    severe; s/p multiple biopsies  . CAD (coronary artery disease)   . Carotid artery occlusion   . Chronic rhinitis   . COPD (chronic obstructive pulmonary disease) (HCC)    2L [place] O2  . Hyperlipidemia   . Hypertension   . On home oxygen therapy    "~ 24/7" (02/08/2017)  . Peripheral vascular disease ([place])   . Syncope and collapse   . Ureteral tumor 08/2015   had endoscopic procedure for evaluation, unable to reach for biopsy    Medications:  Scheduled:  . ALPRAZolam  0.25 mg Oral TID  . aspirin  324 mg Oral NOW   Or  . aspirin  300 mg Rectal NOW  . aspirin EC  81 mg Oral Daily  . atorvastatin  20 mg Oral Daily  . B-complex with vitamin C  1 tablet Oral Daily  . cholecalciferol  2,000 Units Oral Daily  . famotidine  20 mg Oral Daily  . finasteride  5 mg Oral q  morning - 10a  . fluticasone  2 spray Each Nare Daily  . furosemide  40 mg Intravenous BID  . metoprolol succinate  25 mg Oral Daily  . mometasone-formoterol  2 puff Inhalation BID  . multivitamin-lutein  1 capsule Oral Daily  . predniSONE  10 mg Oral Q breakfast  . tiotropium  1 capsule Inhalation q morning - 10a  . vitamin B-12  100 mcg Oral Daily   Assessment: 71 yom presenting with increased SOB and bilateral lower extremity swelling with hx of EF 50%.  Pharmacy consulted to start heparin for ACS/STEMI.  No anticoagulants PTA. Hgb is 13.7, plt WNL. Trop is 0.29. No signs/symptoms of bleeding.   Initial heparin level at goal, hgb down slightly this am. Will check confirmatory heparin level later in am.  Goal of Therapy:  Heparin level 0.3-0.7 units/ml Monitor platelets by anticoagulation protocol: Yes   Plan:  Continue heparin infusion at 750 units/hr Check anti-Xa level in 8 hours and daily while on heparin Continue to monitor H&H and platelets  Erin Hearing PharmD., BCPS Clinical Pharmacist 03/09/2017 2:41 AM

## 2017-03-09 NOTE — Progress Notes (Signed)
Pt has his personal spirava and symbacot from home, don't want to use what is order here,  according to pt wife, she spoke to the MD this AM and she said it was okay for pt to use, wife wants to keep with her at bed side, RN called pharmacist and explain the situation. This RN did see the pt use his personal spiriva today.

## 2017-03-09 NOTE — Progress Notes (Signed)
[place] for heparin Indication: chest pain/ACS  Allergies  Allergen Reactions  . Sulfonamide Derivatives Swelling    REACTION: facial/tongue swelling  . Cefdinir Diarrhea    REACTION: diarrhea  . Ciprofloxacin Itching and Rash    Red itchy hands  . Nitrofurantoin Swelling    Swollen hands  . Levaquin [Levofloxacin] Itching and Rash    Patient Measurements: Height: 5\' 8"  (172.7 cm) Weight: 127 lb 9.6 oz (57.9 kg) IBW/kg (Calculated) : 68.4 Heparin Dosing Weight: 62.7 kg  Vital Signs: Temp: 97.8 F (36.6 C) (03/01 0807) Temp Source: Oral (03/01 0807) BP: 123/71 (03/01 0828) Pulse Rate: 85 (03/01 0828)  Labs: Recent Labs    03/08/17 1545 03/08/17 1727 03/09/17 0139 03/09/17 0832  HGB 13.7  --  12.3*  --   HCT 41.7  --  37.1*  --   PLT 199  --  180  --   LABPROT  --   --  14.7  --   INR  --   --  1.16  --   HEPARINUNFRC  --   --  0.36 0.31  CREATININE 1.13  --  1.15  --   TROPONINI  --  0.33* 0.52* 0.54*    Estimated Creatinine Clearance: 38.5 mL/min (by C-G formula based on SCr of 1.15 mg/dL).   Medical History: Past Medical History:  Diagnosis Date  . Asthma   . BPH (benign prostatic hyperplasia)    severe; s/p multiple biopsies  . CAD (coronary artery disease)   . Carotid artery occlusion   . Chronic rhinitis   . COPD (chronic obstructive pulmonary disease) (HCC)    2L [place] O2  . Elevated troponin 03/09/2017  . Hyperlipidemia   . Hypertension   . On home oxygen therapy    "~ 24/7" (02/08/2017)  . Peripheral vascular disease ([place])   . Syncope and collapse   . Ureteral tumor 08/2015   had endoscopic procedure for evaluation, unable to reach for biopsy    Medications:  Scheduled:  . ALPRAZolam  0.25 mg Oral TID  . aspirin  324 mg Oral NOW   Or  . aspirin  300 mg Rectal NOW  . aspirin EC  81 mg Oral Daily  . atorvastatin  20 mg Oral Daily  . B-complex with vitamin C  1 tablet Oral Daily  . cholecalciferol   2,000 Units Oral Daily  . famotidine  20 mg Oral Daily  . finasteride  5 mg Oral q morning - 10a  . fluticasone  2 spray Each Nare Daily  . furosemide  40 mg Intravenous BID  . metoprolol succinate  25 mg Oral Daily  . mometasone-formoterol  2 puff Inhalation BID  . multivitamin  1 tablet Oral Daily  . predniSONE  10 mg Oral Q breakfast  . tiotropium  1 capsule Inhalation q morning - 10a  . vitamin B-12  100 mcg Oral Daily   Assessment: 31 yom presenting with increased SOB and bilateral lower extremity swelling with hx of EF 50%.  Pharmacy to dose heparin for ACS. Echo pending -heparin level is at goal .  Goal of Therapy:  Heparin level 0.3-0.7 units/ml Monitor platelets by anticoagulation protocol: Yes   Plan:  -Continue heparin infusion at 750 units/hr -Heparin level in 6 hours and daily wth CBC daily  Hildred Laser, PharmD Clinical Pharmacist Clinical phone from 8:30-4:00 is [phone] After 4pm, please call Main Rx [phone]) for assistance. 03/09/2017 11:06 AM

## 2017-03-09 NOTE — Progress Notes (Signed)
Progress Note  Patient Name: [name] Date of Encounter: 03/09/2017  Primary Cardiologist:  Belva Crome III, MD  Subjective   No CP overnight, lots of urine output last pm, not that much overnight. Breathing better. Has problems w/ orthostatic hypotension, is concerned about BP being low-nl.  Inpatient Medications    Scheduled Meds: . ALPRAZolam  0.25 mg Oral TID  . aspirin  324 mg Oral NOW   Or  . aspirin  300 mg Rectal NOW  . aspirin EC  81 mg Oral Daily  . atorvastatin  20 mg Oral Daily  . B-complex with vitamin C  1 tablet Oral Daily  . cholecalciferol  2,000 Units Oral Daily  . famotidine  20 mg Oral Daily  . finasteride  5 mg Oral q morning - 10a  . fluticasone  2 spray Each Nare Daily  . furosemide  40 mg Intravenous BID  . metoprolol succinate  25 mg Oral Daily  . mometasone-formoterol  2 puff Inhalation BID  . multivitamin  1 tablet Oral Daily  . predniSONE  10 mg Oral Q breakfast  . tiotropium  1 capsule Inhalation q morning - 10a  . vitamin B-12  100 mcg Oral Daily   Continuous Infusions: . sodium chloride 10 mL/hr at 03/08/17 1848  . heparin 750 Units/hr (03/08/17 1900)   PRN Meds: acetaminophen, levalbuterol, nitroGLYCERIN, ondansetron (ZOFRAN) IV   Vital Signs    Vitals:   03/09/17 0500 03/09/17 0606 03/09/17 0807 03/09/17 0828  BP:  131/66 104/67 123/71  Pulse:   98 85  Resp:   20   Temp:   97.8 F (36.6 C)   TempSrc:   Oral   SpO2:   97%   Weight: 127 lb 9.6 oz (57.9 kg)     Height:        Intake/Output Summary (Last 24 hours) at 03/09/2017 0831 Last data filed at 03/09/2017 0554 Gross per 24 hour  Intake 415 ml  Output 2525 ml  Net -2110 ml   Filed Weights   03/08/17 1858 03/08/17 2010 03/09/17 0500  Weight: 138 lb (62.6 kg) 130 lb 6.4 oz (59.1 kg) 127 lb 9.6 oz (57.9 kg)    Telemetry    SR, PVCs and PACs - Personally Reviewed  ECG    03/01 SR, PACs, poor tracing, RBBB is old - Personally Reviewed  Physical Exam    General: Well developed, well nourished, male appearing in no acute distress. Head: Normocephalic, atraumatic.  Neck: Supple without bruits, JVD not elevated. Lungs:  Resp regular and unlabored, decreased BS bases, some fine rales Heart: slightly irregular R&R, S1, S2, no S3, S4, or murmur; no rub. Abdomen: Soft, non-tender, non-distended with normoactive bowel sounds. No hepatomegaly. No rebound/guarding. No obvious abdominal masses. Extremities: No clubbing, cyanosis, 1+ pedal edema. Distal pedal pulses are 2+ bilaterally. Neuro: Alert and oriented X 3. Moves all extremities spontaneously. Psych: Normal affect.  Labs    Hematology Recent Labs  Lab 03/08/17 1545 03/09/17 0139  WBC 13.8* 9.2  RBC 4.25 3.84*  HGB 13.7 12.3*  HCT 41.7 37.1*  MCV 98.1 96.6  MCH 32.2 32.0  MCHC 32.9 33.2  RDW 12.9 12.9  PLT 199 180    Chemistry Recent Labs  Lab 03/08/17 1545 03/09/17 0139  NA 135 138  K 4.5 3.9  CL 97* 98*  CO2 28 30  GLUCOSE 193* 106*  BUN 17 17  CREATININE 1.13 1.15  CALCIUM 9.2 8.8*  GFRNONAA 57*  56*  GFRAA >60 >60  ANIONGAP 10 10     Cardiac Enzymes Recent Labs  Lab 03/08/17 1727 03/09/17 0139  TROPONINI 0.33* 0.52*    Recent Labs  Lab 03/08/17 1608  TROPIPOC 0.29*     BNP Recent Labs  Lab 03/08/17 1727  BNP 237.5*     Radiology    Dg Chest 2 View  Result Date: 03/08/2017 CLINICAL DATA:  Chest pain and shortness of breath. EXAM: CHEST  2 VIEW COMPARISON:  Chest x-ray dated February 14, 2017. FINDINGS: The heart size and mediastinal contours are within normal limits. Prior CABG. Normal pulmonary vascularity. Atherosclerotic calcification of the aortic arch. The lungs remain hyperinflated. No focal consolidation, pleural effusion, or pneumothorax. No acute osseous abnormality. IMPRESSION: 1. COPD.  No active cardiopulmonary disease. Electronically Signed   By: Titus Dubin M.D.   On: 03/08/2017 17:14   Ct Angio Chest Pe W/cm &/or Wo  Cm  Result Date: 03/08/2017 CLINICAL DATA:  Intermittent chest pain. Palpitations. History of COPD, hypertension, hyperlipidemia. EXAM: CT ANGIOGRAPHY CHEST WITH CONTRAST TECHNIQUE: Multidetector CT imaging of the chest was performed using the standard protocol during bolus administration of intravenous contrast. Multiplanar CT image reconstructions and MIPs were obtained to evaluate the vascular anatomy. CONTRAST:  <See Chart> ISOVUE-370 IOPAMIDOL (ISOVUE-370) INJECTION 76% COMPARISON:  Chest radiograph March 08, 2017 FINDINGS: CARDIOVASCULAR: Adequate contrast opacification of the pulmonary artery's. Main pulmonary artery is not enlarged. Mild stenosis versus respiratory motion through pulmonary arterial origin. No pulmonary arterial filling defects to the level of the subsegmental branches. Heart size is normal, no right heart strain. No pericardial effusion. Thoracic aorta is normal course and caliber, moderate calcific atherosclerosis aortic arch. Severe coronary artery calcifications. MEDIASTINUM/NODES: No lymphadenopathy by CT size criteria. LUNGS/PLEURA: Tracheobronchial tree is patent, no pneumothorax. No pleural effusions, focal consolidations, pulmonary nodules or masses. Punctate calcified granulomas. Biapical pleuroparenchymal scarring. Mild RIGHT apical bullous changes. Moderate to severe centrilobular emphysema. Bibasilar atelectasis/scarring. UPPER ABDOMEN: Mild LEFT hydronephrosis. RIGHT extra renal pelvis. Punctate RIGHT nephrolithiasis. Mildly thickened adrenal glands associated with hyperplasia. MUSCULOSKELETAL: Old median sternotomy. Severe LEFT greater than RIGHT glenohumeral osteoarthrosis. Osteopenia. Multilevel Schmorl's nodes. Review of the MIP images confirms the above findings. IMPRESSION: 1. No acute pulmonary embolism. 2. Emphysema with bibasilar atelectasis/scarring. 3. Mild LEFT hydronephrosis. Aortic Atherosclerosis (ICD10-I70.0) and Emphysema (ICD10-J43.9). Electronically  Signed   By: Elon Alas M.D.   On: 03/08/2017 18:42     Cardiac Studies   ECHO: ordered    Patient Profile     84 y.o. male w/ hx CABG 2003,severe COPD with cor pulmonale, FEV1 0.58 in 2017, on 2 L oxygen and nocturnal ventilation, steroid dependent,uses trilogy machine,  HTN, HLD, PAD, and history of carotid obstructive disease, RBBB, was admitted 02/28 from the office with CHF.   Assessment & Plan     Principal Problem: 1.  Acute heart failure (HCC) - good UOP overnight, minimal volume overload on exam today - ck'd orthostatics, they were positive>>hold Lasix for now - CT did not show fluid, just emphysema, may not need more IV Lasix - f/u on echo, that may show pulm HTN  Active Problems: 2.  Chronic obstructive pulmonary disease (HCC) - home O2 at 2lpm and nocturnal ventilation - no wheezing  3.  Tachycardia - SR, frequent ectopy w/ PACs and PVCs, underlying RBBB - continue low-dose metoprolol  4. Elevated troponin - minor crescendo/decrescendo trend - no CP - f/u on echo, EF was 50% in 2016 - hx CABG 2003  LIMA to LAD, SVG to OM1-2, SVG to RCA and PDA, negative cardiolite 2009 & ?2015   Jonetta Speak , PA-C 8:31 AM 03/09/2017 Pager: [phone]

## 2017-03-09 NOTE — Progress Notes (Signed)
  Echocardiogram 2D Echocardiogram has been performed.  [name] [name] 03/09/2017, 12:22 PM

## 2017-03-10 DIAGNOSIS — R000 Tachycardia, unspecified: Secondary | ICD-10-CM

## 2017-03-10 DIAGNOSIS — I214 Non-ST elevation (NSTEMI) myocardial infarction: Secondary | ICD-10-CM

## 2017-03-10 DIAGNOSIS — I219 Acute myocardial infarction, unspecified: Secondary | ICD-10-CM

## 2017-03-10 LAB — CBC
HCT: 34.6 % — ABNORMAL LOW (ref 39.0–52.0)
Hemoglobin: 11.7 g/dL — ABNORMAL LOW (ref 13.0–17.0)
MCH: 33.3 pg (ref 26.0–34.0)
MCHC: 33.8 g/dL (ref 30.0–36.0)
MCV: 98.6 fL (ref 78.0–100.0)
Platelets: 177 10*3/uL (ref 150–400)
RBC: 3.51 MIL/uL — ABNORMAL LOW (ref 4.22–5.81)
RDW: 13.2 % (ref 11.5–15.5)
WBC: 8 10*3/uL (ref 4.0–10.5)

## 2017-03-10 LAB — HEPARIN LEVEL (UNFRACTIONATED)
Heparin Unfractionated: 0.26 IU/mL — ABNORMAL LOW (ref 0.30–0.70)
Heparin Unfractionated: 0.23 IU/mL — ABNORMAL LOW (ref 0.30–0.70)

## 2017-03-10 LAB — BASIC METABOLIC PANEL
Anion gap: 10 (ref 5–15)
BUN: 17 mg/dL (ref 6–20)
CO2: 30 mmol/L (ref 22–32)
Calcium: 9 mg/dL (ref 8.9–10.3)
Chloride: 94 mmol/L — ABNORMAL LOW (ref 101–111)
Creatinine, Ser: 1.13 mg/dL (ref 0.61–1.24)
GFR calc Af Amer: >60 mL/min (ref >60)
GFR calc non Af Amer: 57 mL/min — ABNORMAL LOW (ref >60)
Glucose, Bld: 121 mg/dL — ABNORMAL HIGH (ref 65–99)
Potassium: 3.6 mmol/L (ref 3.5–5.1)
Sodium: 134 mmol/L — ABNORMAL LOW (ref 135–145)

## 2017-03-10 MED ORDER — FUROSEMIDE 10 MG/ML IJ SOLN
20.0000 mg | Freq: Two times a day (BID) | INTRAMUSCULAR | Status: DC
  Administered 2017-03-10 – 2017-03-11 (×2): 20 mg via INTRAVENOUS
  Filled 2017-03-10 (×2): qty 2

## 2017-03-10 MED ORDER — ALUM & MAG HYDROXIDE-SIMETH 200-200-20 MG/5ML PO SUSP
30.0000 mL | ORAL | Status: DC | PRN
  Administered 2017-03-10 (×2): 30 mL via ORAL
  Filled 2017-03-10 (×2): qty 30

## 2017-03-10 NOTE — Progress Notes (Addendum)
[place] for heparin Indication: chest pain/ACS  Allergies  Allergen Reactions  . Sulfonamide Derivatives Swelling    REACTION: facial/tongue swelling  . Cefdinir Diarrhea    REACTION: diarrhea  . Ciprofloxacin Itching and Rash    Red itchy hands  . Nitrofurantoin Swelling    Swollen hands  . Levaquin [Levofloxacin] Itching and Rash    Patient Measurements: Height: 5\' 8"  (172.7 cm) Weight: 128 lb 12.8 oz (58.4 kg) IBW/kg (Calculated) : 68.4 Heparin Dosing Weight: 58.4 kg  Vital Signs: Temp: 97.6 F (36.4 C) (03/02 1300) Temp Source: Oral (03/02 1300) BP: 119/59 (03/02 1300) Pulse Rate: 78 (03/02 1300)  Labs: Recent Labs    03/08/17 1545 03/08/17 1727  03/09/17 0139 03/09/17 0832 03/10/17 0312 03/10/17 0951 03/10/17 1216  HGB 13.7  --   --  12.3*  --  11.7*  --   --   HCT 41.7  --   --  37.1*  --  34.6*  --   --   PLT 199  --   --  180  --  177  --   --   LABPROT  --   --   --  14.7  --   --   --   --   INR  --   --   --  1.16  --   --   --   --   HEPARINUNFRC  --   --    < > 0.36 0.31 0.26*  --  0.23*  CREATININE 1.13  --   --  1.15  --   --  1.13  --   TROPONINI  --  0.33*  --  0.52* 0.54*  --   --   --    < > = values in this interval not displayed.    Estimated Creatinine Clearance: 39.5 mL/min (by C-G formula based on SCr of 1.13 mg/dL).   Medical History: Past Medical History:  Diagnosis Date  . Asthma   . BPH (benign prostatic hyperplasia)    severe; s/p multiple biopsies  . CAD (coronary artery disease)   . Carotid artery occlusion   . Chronic rhinitis   . COPD (chronic obstructive pulmonary disease) (HCC)    2L [place] O2  . Elevated troponin 03/09/2017  . Hyperlipidemia   . Hypertension   . On home oxygen therapy    "~ 24/7" (02/08/2017)  . Peripheral vascular disease ([place])   . Syncope and collapse   . Ureteral tumor 08/2015   had endoscopic procedure for evaluation, unable to reach for biopsy     Assessment: 26 yom presenting with increased SOB and bilateral lower extremity swelling with hx of EF 50%.  Pharmacy to dose heparin for ACS. CTA negative for PE.  Heparin level remains below goal this morning at 0.23. CBC stable. No issues with drip or bleeding per RN.  Goal of Therapy:  Heparin level 0.3-0.7 units/ml Monitor platelets by anticoagulation protocol: Yes   Plan:  -Increase heparin infusion to 1000 units/hr -8h heparin level -Heparin level daily with CBC daily -F/u Cardiology plans for heparin  Elicia Lamp, PharmD, BCPS Clinical Pharmacist Clinical phone for 03/10/2017 until 3:30pm: [phone] If after 3:30pm, please call main pharmacy at: [phone] 03/10/2017 2:14 PM

## 2017-03-10 NOTE — Progress Notes (Signed)
Progress Note  Patient Name: [name] Date of Encounter: 03/10/2017  Primary Cardiologist:  Belva Crome III, MD  Subjective   No CP overnight, he urinated a lot after 5 pm lasix dose yesterday, hypotensive at 3 am.  Inpatient Medications    Scheduled Meds: . ALPRAZolam  0.25 mg Oral TID  . aspirin EC  81 mg Oral Daily  . atorvastatin  20 mg Oral Daily  . B-complex with vitamin C  1 tablet Oral Daily  . cholecalciferol  2,000 Units Oral Daily  . famotidine  20 mg Oral Daily  . finasteride  5 mg Oral q morning - 10a  . fluticasone  2 spray Each Nare Daily  . furosemide  40 mg Intravenous BID  . metoprolol succinate  25 mg Oral Daily  . mometasone-formoterol  2 puff Inhalation BID  . multivitamin  1 tablet Oral Daily  . predniSONE  10 mg Oral Q breakfast  . tiotropium  1 capsule Inhalation q morning - 10a  . vitamin B-12  100 mcg Oral Daily   Continuous Infusions: . sodium chloride 10 mL/hr at 03/08/17 1848  . heparin 850 Units/hr (03/10/17 0518)   PRN Meds: acetaminophen, levalbuterol, nitroGLYCERIN, ondansetron (ZOFRAN) IV   Vital Signs    Vitals:   03/10/17 0140 03/10/17 0330 03/10/17 0515 03/10/17 0901  BP: (!) 110/53 (!) 110/54 116/67 121/61  Pulse:   82 89  Resp:    18  Temp:   97.6 F (36.4 C)   TempSrc:   Oral   SpO2:   98%   Weight:   128 lb 12.8 oz (58.4 kg)   Height:        Intake/Output Summary (Last 24 hours) at 03/10/2017 0918 Last data filed at 03/10/2017 5188 Gross per 24 hour  Intake 480 ml  Output 1900 ml  Net -1420 ml   Filed Weights   03/08/17 2010 03/09/17 0500 03/10/17 0515  Weight: 130 lb 6.4 oz (59.1 kg) 127 lb 9.6 oz (57.9 kg) 128 lb 12.8 oz (58.4 kg)    Telemetry    SR, PVCs and PACs - Personally Reviewed  ECG    03/01 SR, PACs, poor tracing, RBBB is old - Personally Reviewed  Physical Exam   General: Well developed, well nourished, male appearing in no acute distress. Head: Normocephalic, atraumatic.  Neck: Supple  without bruits, JVD not elevated. Lungs:  Resp regular and unlabored, decreased BS bases, some fine rales Heart: slightly irregular R&R, S1, S2, no S3, S4, or murmur; no rub. Abdomen: Soft, non-tender, non-distended with normoactive bowel sounds. No hepatomegaly. No rebound/guarding. No obvious abdominal masses. Extremities: No clubbing, cyanosis, 1+ pedal edema. Distal pedal pulses are 2+ bilaterally. Neuro: Alert and oriented X 3. Moves all extremities spontaneously. Psych: Normal affect.  Labs    Hematology Recent Labs  Lab 03/08/17 1545 03/09/17 0139 03/10/17 0312  WBC 13.8* 9.2 8.0  RBC 4.25 3.84* 3.51*  HGB 13.7 12.3* 11.7*  HCT 41.7 37.1* 34.6*  MCV 98.1 96.6 98.6  MCH 32.2 32.0 33.3  MCHC 32.9 33.2 33.8  RDW 12.9 12.9 13.2  PLT 199 180 177    Chemistry Recent Labs  Lab 03/08/17 1545 03/09/17 0139  NA 135 138  K 4.5 3.9  CL 97* 98*  CO2 28 30  GLUCOSE 193* 106*  BUN 17 17  CREATININE 1.13 1.15  CALCIUM 9.2 8.8*  GFRNONAA 57* 56*  GFRAA >60 >60  ANIONGAP 10 10  Cardiac Enzymes Recent Labs  Lab 03/08/17 1727 03/09/17 0139 03/09/17 0832  TROPONINI 0.33* 0.52* 0.54*    Recent Labs  Lab 03/08/17 1608  TROPIPOC 0.29*     BNP Recent Labs  Lab 03/08/17 1727  BNP 237.5*     Radiology    Dg Chest 2 View  Result Date: 03/08/2017 CLINICAL DATA:  Chest pain and shortness of breath. EXAM: CHEST  2 VIEW COMPARISON:  Chest x-ray dated February 14, 2017. FINDINGS: The heart size and mediastinal contours are within normal limits. Prior CABG. Normal pulmonary vascularity. Atherosclerotic calcification of the aortic arch. The lungs remain hyperinflated. No focal consolidation, pleural effusion, or pneumothorax. No acute osseous abnormality. IMPRESSION: 1. COPD.  No active cardiopulmonary disease. Electronically Signed   By: Titus Dubin M.D.   On: 03/08/2017 17:14   Ct Angio Chest Pe W/cm &/or Wo Cm  Result Date: 03/08/2017 CLINICAL DATA:   Intermittent chest pain. Palpitations. History of COPD, hypertension, hyperlipidemia. EXAM: CT ANGIOGRAPHY CHEST WITH CONTRAST TECHNIQUE: Multidetector CT imaging of the chest was performed using the standard protocol during bolus administration of intravenous contrast. Multiplanar CT image reconstructions and MIPs were obtained to evaluate the vascular anatomy. CONTRAST:  <See Chart> ISOVUE-370 IOPAMIDOL (ISOVUE-370) INJECTION 76% COMPARISON:  Chest radiograph March 08, 2017 FINDINGS: CARDIOVASCULAR: Adequate contrast opacification of the pulmonary artery's. Main pulmonary artery is not enlarged. Mild stenosis versus respiratory motion through pulmonary arterial origin. No pulmonary arterial filling defects to the level of the subsegmental branches. Heart size is normal, no right heart strain. No pericardial effusion. Thoracic aorta is normal course and caliber, moderate calcific atherosclerosis aortic arch. Severe coronary artery calcifications. MEDIASTINUM/NODES: No lymphadenopathy by CT size criteria. LUNGS/PLEURA: Tracheobronchial tree is patent, no pneumothorax. No pleural effusions, focal consolidations, pulmonary nodules or masses. Punctate calcified granulomas. Biapical pleuroparenchymal scarring. Mild RIGHT apical bullous changes. Moderate to severe centrilobular emphysema. Bibasilar atelectasis/scarring. UPPER ABDOMEN: Mild LEFT hydronephrosis. RIGHT extra renal pelvis. Punctate RIGHT nephrolithiasis. Mildly thickened adrenal glands associated with hyperplasia. MUSCULOSKELETAL: Old median sternotomy. Severe LEFT greater than RIGHT glenohumeral osteoarthrosis. Osteopenia. Multilevel Schmorl's nodes. Review of the MIP images confirms the above findings. IMPRESSION: 1. No acute pulmonary embolism. 2. Emphysema with bibasilar atelectasis/scarring. 3. Mild LEFT hydronephrosis. Aortic Atherosclerosis (ICD10-I70.0) and Emphysema (ICD10-J43.9). Electronically Signed   By: Elon Alas M.D.   On:  03/08/2017 18:42     Cardiac Studies   ECHO: 03/09/2017  - Left ventricle: The cavity size was normal. Systolic function was   normal. The estimated ejection fraction was in the range of 60%   to 65%. Wall motion was normal; there were no regional wall   motion abnormalities. Doppler parameters are consistent with   abnormal left ventricular relaxation (grade 1 diastolic   dysfunction). - Aortic valve: Transvalvular velocity was within the normal range.   There was no stenosis. There was no regurgitation. - Mitral valve: Transvalvular velocity was within the normal range.   There was no evidence for stenosis. There was trivial   regurgitation. - Right ventricle: The cavity size was normal. Wall thickness was   normal. Systolic function was reduced. - Tricuspid valve: There was mild regurgitation. - Pulmonary arteries: Systolic pressure was within the normal   range. PA peak pressure: 24 mm Hg (S). - Pericardium, extracardiac: A trivial pericardial effusion was   identified.     Patient Profile     84 y.o. male w/ hx CABG 2003,severe COPD with cor pulmonale, FEV1 0.58 in 2017, on 2 L  oxygen and nocturnal ventilation, steroid dependent,uses trilogy machine,  HTN, HLD, PAD, and history of carotid obstructive disease, RBBB, was admitted 02/28 from the office with CHF.   Assessment & Plan     Principal Problem: 1.  Acute heart failure (HCC) - good UOP overnight, residual LE swelling on exam today - restart lasix 20 mg iv BID, check BMP this am - CT did not show fluid, just emphysema, may not need more IV Lasix - f/u on echo, that may show pulm HTN  Active Problems: 2.  Chronic obstructive pulmonary disease (HCC) - home O2 at 2lpm and nocturnal ventilation - no wheezing  3.  Tachycardia - SR, frequent ectopy w/ PACs and PVCs, underlying RBBB, now almost no ectopy - continue low-dose metoprolol  4. Elevated troponin - minor crescendo/decrescendo trend - no CP - f/u  echo showed LVEF 60-65% - hx CABG 2003 LIMA to LAD, SVG to OM1-2, SVG to RCA and PDA, negative cardiolite 2009 & ?[id number] , PA-C 9:18 AM 03/10/2017 Pager: [phone]

## 2017-03-10 NOTE — Progress Notes (Signed)
[facility] for heparin Indication: chest pain/ACS  Allergies  Allergen Reactions  . Sulfonamide Derivatives Swelling    REACTION: facial/tongue swelling  . Cefdinir Diarrhea    REACTION: diarrhea  . Ciprofloxacin Itching and Rash    Red itchy hands  . Nitrofurantoin Swelling    Swollen hands  . Levaquin [Levofloxacin] Itching and Rash    Patient Measurements: Height: 5\' 8"  (172.7 cm) Weight: 127 lb 9.6 oz (57.9 kg) IBW/kg (Calculated) : 68.4 Heparin Dosing Weight: 62.7 kg  Vital Signs: Temp: 97.7 F (36.5 C) (03/02 0129) Temp Source: Oral (03/02 0129) BP: 110/54 (03/02 0330) Pulse Rate: 63 (03/02 0129)  Labs: Recent Labs    03/08/17 1545 03/08/17 1727 03/09/17 0139 03/09/17 0832 03/10/17 0312  HGB 13.7  --  12.3*  --   --   HCT 41.7  --  37.1*  --   --   PLT 199  --  180  --   --   LABPROT  --   --  14.7  --   --   INR  --   --  1.16  --   --   HEPARINUNFRC  --   --  0.36 0.31 0.26*  CREATININE 1.13  --  1.15  --   --   TROPONINI  --  0.33* 0.52* 0.54*  --     Estimated Creatinine Clearance: 38.5 mL/min (by C-G formula based on SCr of 1.15 mg/dL).   Medical History: Past Medical History:  Diagnosis Date  . Asthma   . BPH (benign prostatic hyperplasia)    severe; s/p multiple biopsies  . CAD (coronary artery disease)   . Carotid artery occlusion   . Chronic rhinitis   . COPD (chronic obstructive pulmonary disease) (HCC)    2L [place] O2  . Elevated troponin 03/09/2017  . Hyperlipidemia   . Hypertension   . On home oxygen therapy    "~ 24/7" (02/08/2017)  . Peripheral vascular disease ([place])   . Syncope and collapse   . Ureteral tumor 08/2015   had endoscopic procedure for evaluation, unable to reach for biopsy    Assessment: 64 yom presenting with increased SOB and bilateral lower extremity swelling with hx of EF 50%.  Pharmacy to dose heparin for ACS.  Heparin level just below goal this morning at 0.26. CBC in  process. No issues noted overnight.   Goal of Therapy:  Heparin level 0.3-0.7 units/ml Monitor platelets by anticoagulation protocol: Yes   Plan:  -Increase heparin infusion to 850 units/hr -Heparin level daily wth CBC daily  Erin Hearing PharmD., BCPS Clinical Pharmacist 03/10/2017 4:59 AM

## 2017-03-10 NOTE — Progress Notes (Signed)
Pt family refusing IV lasix. Pt wife states she wants a BMP draw to see pt electrolytes, another chest xray and MD assessment. Pt wife states pt is back at his baseline weight and does not want lasix. Education provided to pt and pt family

## 2017-03-11 DIAGNOSIS — I5041 Acute combined systolic (congestive) and diastolic (congestive) heart failure: Secondary | ICD-10-CM

## 2017-03-11 LAB — CBC
HCT: 41.7 % (ref 39.0–52.0)
Hemoglobin: 13.7 g/dL (ref 13.0–17.0)
MCH: 32.6 pg (ref 26.0–34.0)
MCHC: 32.9 g/dL (ref 30.0–36.0)
MCV: 99.3 fL (ref 78.0–100.0)
Platelets: 197 10*3/uL (ref 150–400)
RBC: 4.2 MIL/uL — ABNORMAL LOW (ref 4.22–5.81)
RDW: 13 % (ref 11.5–15.5)
WBC: 8.3 10*3/uL (ref 4.0–10.5)

## 2017-03-11 LAB — BASIC METABOLIC PANEL
Anion gap: 10 (ref 5–15)
BUN: 19 mg/dL (ref 6–20)
CO2: 32 mmol/L (ref 22–32)
Calcium: 9.6 mg/dL (ref 8.9–10.3)
Chloride: 96 mmol/L — ABNORMAL LOW (ref 101–111)
Creatinine, Ser: 1.13 mg/dL (ref 0.61–1.24)
GFR calc Af Amer: >60 mL/min (ref >60)
GFR calc non Af Amer: 57 mL/min — ABNORMAL LOW (ref >60)
Glucose, Bld: 112 mg/dL — ABNORMAL HIGH (ref 65–99)
Potassium: 4.2 mmol/L (ref 3.5–5.1)
Sodium: 138 mmol/L (ref 135–145)

## 2017-03-11 LAB — HEPARIN LEVEL (UNFRACTIONATED)
Heparin Unfractionated: 0.58 IU/mL (ref 0.30–0.70)
Heparin Unfractionated: 0.71 IU/mL — ABNORMAL HIGH (ref 0.30–0.70)

## 2017-03-11 MED ORDER — POLYETHYLENE GLYCOL 3350 17 G PO PACK
17.0000 g | PACK | Freq: Every day | ORAL | Status: DC
  Administered 2017-03-11 – 2017-03-12 (×2): 17 g via ORAL
  Filled 2017-03-11 (×2): qty 1

## 2017-03-11 MED ORDER — FUROSEMIDE 10 MG/ML IJ SOLN
20.0000 mg | Freq: Three times a day (TID) | INTRAMUSCULAR | Status: DC
  Administered 2017-03-11 – 2017-03-12 (×3): 20 mg via INTRAVENOUS
  Filled 2017-03-11 (×3): qty 2

## 2017-03-11 NOTE — Progress Notes (Signed)
Pt sleeping quietly no signs distress to report.  Heparin @10  per order.  Pt stable

## 2017-03-11 NOTE — Progress Notes (Signed)
[facility] for heparin Indication: chest pain/ACS  Allergies  Allergen Reactions  . Sulfonamide Derivatives Swelling    REACTION: facial/tongue swelling  . Cefdinir Diarrhea    REACTION: diarrhea  . Ciprofloxacin Itching and Rash    Red itchy hands  . Nitrofurantoin Swelling    Swollen hands  . Levaquin [Levofloxacin] Itching and Rash    Patient Measurements: Height: 5\' 8"  (172.7 cm) Weight: 128 lb 12.8 oz (58.4 kg) IBW/kg (Calculated) : 68.4 Heparin Dosing Weight: 58.4 kg  Vital Signs: Temp: 98.2 F (36.8 C) (03/02 2020) Temp Source: Oral (03/02 2020) BP: 120/62 (03/02 2020) Pulse Rate: 89 (03/02 2020)  Labs: Recent Labs    03/08/17 1545 03/08/17 1727  03/09/17 0139 03/09/17 0832 03/10/17 0312 03/10/17 0951 03/10/17 1216 03/10/17 2358  HGB 13.7  --   --  12.3*  --  11.7*  --   --   --   HCT 41.7  --   --  37.1*  --  34.6*  --   --   --   PLT 199  --   --  180  --  177  --   --   --   LABPROT  --   --   --  14.7  --   --   --   --   --   INR  --   --   --  1.16  --   --   --   --   --   HEPARINUNFRC  --   --    < > 0.36 0.31 0.26*  --  0.23* 0.58  CREATININE 1.13  --   --  1.15  --   --  1.13  --   --   TROPONINI  --  0.33*  --  0.52* 0.54*  --   --   --   --    < > = values in this interval not displayed.    Estimated Creatinine Clearance: 39.5 mL/min (by C-G formula based on SCr of 1.13 mg/dL).   Medical History: Past Medical History:  Diagnosis Date  . Asthma   . BPH (benign prostatic hyperplasia)    severe; s/p multiple biopsies  . CAD (coronary artery disease)   . Carotid artery occlusion   . Chronic rhinitis   . COPD (chronic obstructive pulmonary disease) (HCC)    2L [place] O2  . Elevated troponin 03/09/2017  . Hyperlipidemia   . Hypertension   . On home oxygen therapy    "~ 24/7" (02/08/2017)  . Peripheral vascular disease ([place])   . Syncope and collapse   . Ureteral tumor 08/2015   had endoscopic procedure  for evaluation, unable to reach for biopsy    Assessment: 25 yom presenting with increased SOB and bilateral lower extremity swelling with hx of EF 50%.  Pharmacy to dose heparin for ACS. CTA negative for PE.  Heparin level at goal this morning at 0.5. CBC stable. No issues with drip or bleeding per RN.  Goal of Therapy:  Heparin level 0.3-0.7 units/ml Monitor platelets by anticoagulation protocol: Yes   Plan:  -Continue heparin infusion at 1000 units/hr -Heparin level daily with CBC daily -F/u Cardiology plans for heparin  Erin Hearing PharmD., BCPS Clinical Pharmacist 03/11/2017 1:23 AM

## 2017-03-11 NOTE — Progress Notes (Addendum)
Progress Note  Patient Name: [name] Date of Encounter: 03/11/2017  Primary Cardiologist:  Sinclair Grooms, MD  Subjective   No CP overnight, he feels more tired today. Worsening LE edema.   Inpatient Medications    Scheduled Meds: . ALPRAZolam  0.25 mg Oral TID  . aspirin EC  81 mg Oral Daily  . atorvastatin  20 mg Oral Daily  . B-complex with vitamin C  1 tablet Oral Daily  . cholecalciferol  2,000 Units Oral Daily  . famotidine  20 mg Oral Daily  . finasteride  5 mg Oral q morning - 10a  . fluticasone  2 spray Each Nare Daily  . furosemide  20 mg Intravenous BID  . metoprolol succinate  25 mg Oral Daily  . mometasone-formoterol  2 puff Inhalation BID  . multivitamin  1 tablet Oral Daily  . predniSONE  10 mg Oral Q breakfast  . tiotropium  1 capsule Inhalation q morning - 10a  . vitamin B-12  100 mcg Oral Daily   Continuous Infusions: . sodium chloride 10 mL/hr at 03/08/17 1848  . heparin 1,000 Units/hr (03/10/17 1431)   PRN Meds: acetaminophen, alum & mag hydroxide-simeth, levalbuterol, nitroGLYCERIN, ondansetron (ZOFRAN) IV   Vital Signs    Vitals:   03/10/17 0901 03/10/17 1300 03/10/17 2020 03/11/17 0501  BP: 121/61 (!) 119/59 120/62 119/71  Pulse: 89 78 89 70  Resp: 18 18 18 18   Temp:  97.6 F (36.4 C) 98.2 F (36.8 C) (!) 97.4 F (36.3 C)  TempSrc:  Oral Oral Oral  SpO2:  95% 93% 100%  Weight:    129 lb 3.2 oz (58.6 kg)  Height:        Intake/Output Summary (Last 24 hours) at 03/11/2017 1045 Last data filed at 03/11/2017 0900 Gross per 24 hour  Intake 1228.12 ml  Output 1600 ml  Net -371.88 ml   Filed Weights   03/09/17 0500 03/10/17 0515 03/11/17 0501  Weight: 127 lb 9.6 oz (57.9 kg) 128 lb 12.8 oz (58.4 kg) 129 lb 3.2 oz (58.6 kg)    Telemetry    SR, PVCs and PACs - Personally Reviewed  ECG    03/01 SR, PACs, poor tracing, RBBB is old - Personally Reviewed  Physical Exam   General: Well developed, well nourished, male appearing  in no acute distress. Head: Normocephalic, atraumatic.  Neck: Supple without bruits, JVD not elevated. Lungs:  Resp regular and unlabored, decreased BS bases, some fine rales Heart: slightly irregular R&R, S1, S2, no S3, S4, or murmur; no rub. Abdomen: Soft, non-tender, non-distended with normoactive bowel sounds. No hepatomegaly. No rebound/guarding. No obvious abdominal masses. Extremities: No clubbing, cyanosis, 1+ pedal edema. Distal pedal pulses are 2+ bilaterally. Neuro: Alert and oriented X 3. Moves all extremities spontaneously. Psych: Normal affect.  Labs    Hematology Recent Labs  Lab 03/09/17 0139 03/10/17 0312 03/11/17 0854  WBC 9.2 8.0 8.3  RBC 3.84* 3.51* 4.20*  HGB 12.3* 11.7* 13.7  HCT 37.1* 34.6* 41.7  MCV 96.6 98.6 99.3  MCH 32.0 33.3 32.6  MCHC 33.2 33.8 32.9  RDW 12.9 13.2 13.0  PLT 180 177 197    Chemistry Recent Labs  Lab 03/08/17 1545 03/09/17 0139 03/10/17 0951  NA 135 138 134*  K 4.5 3.9 3.6  CL 97* 98* 94*  CO2 28 30 30   GLUCOSE 193* 106* 121*  BUN 17 17 17   CREATININE 1.13 1.15 1.13  CALCIUM 9.2 8.8* 9.0  GFRNONAA 57* 56* 57*  GFRAA >60 >60 >60  ANIONGAP 10 10 10      Cardiac Enzymes Recent Labs  Lab 03/08/17 1727 03/09/17 0139 03/09/17 0832  TROPONINI 0.33* 0.52* 0.54*    Recent Labs  Lab 03/08/17 1608  TROPIPOC 0.29*     BNP Recent Labs  Lab 03/08/17 1727  BNP 237.5*     Radiology    Dg Chest 2 View  Result Date: 03/08/2017 CLINICAL DATA:  Chest pain and shortness of breath. EXAM: CHEST  2 VIEW COMPARISON:  Chest x-ray dated February 14, 2017. FINDINGS: The heart size and mediastinal contours are within normal limits. Prior CABG. Normal pulmonary vascularity. Atherosclerotic calcification of the aortic arch. The lungs remain hyperinflated. No focal consolidation, pleural effusion, or pneumothorax. No acute osseous abnormality. IMPRESSION: 1. COPD.  No active cardiopulmonary disease. Electronically Signed   By: Titus Dubin M.D.   On: 03/08/2017 17:14   Ct Angio Chest Pe W/cm &/or Wo Cm  Result Date: 03/08/2017 CLINICAL DATA:  Intermittent chest pain. Palpitations. History of COPD, hypertension, hyperlipidemia. EXAM: CT ANGIOGRAPHY CHEST WITH CONTRAST TECHNIQUE: Multidetector CT imaging of the chest was performed using the standard protocol during bolus administration of intravenous contrast. Multiplanar CT image reconstructions and MIPs were obtained to evaluate the vascular anatomy. CONTRAST:  <See Chart> ISOVUE-370 IOPAMIDOL (ISOVUE-370) INJECTION 76% COMPARISON:  Chest radiograph March 08, 2017 FINDINGS: CARDIOVASCULAR: Adequate contrast opacification of the pulmonary artery's. Main pulmonary artery is not enlarged. Mild stenosis versus respiratory motion through pulmonary arterial origin. No pulmonary arterial filling defects to the level of the subsegmental branches. Heart size is normal, no right heart strain. No pericardial effusion. Thoracic aorta is normal course and caliber, moderate calcific atherosclerosis aortic arch. Severe coronary artery calcifications. MEDIASTINUM/NODES: No lymphadenopathy by CT size criteria. LUNGS/PLEURA: Tracheobronchial tree is patent, no pneumothorax. No pleural effusions, focal consolidations, pulmonary nodules or masses. Punctate calcified granulomas. Biapical pleuroparenchymal scarring. Mild RIGHT apical bullous changes. Moderate to severe centrilobular emphysema. Bibasilar atelectasis/scarring. UPPER ABDOMEN: Mild LEFT hydronephrosis. RIGHT extra renal pelvis. Punctate RIGHT nephrolithiasis. Mildly thickened adrenal glands associated with hyperplasia. MUSCULOSKELETAL: Old median sternotomy. Severe LEFT greater than RIGHT glenohumeral osteoarthrosis. Osteopenia. Multilevel Schmorl's nodes. Review of the MIP images confirms the above findings. IMPRESSION: 1. No acute pulmonary embolism. 2. Emphysema with bibasilar atelectasis/scarring. 3. Mild LEFT hydronephrosis. Aortic  Atherosclerosis (ICD10-I70.0) and Emphysema (ICD10-J43.9). Electronically Signed   By: Elon Alas M.D.   On: 03/08/2017 18:42   Cardiac Studies   ECHO: 03/09/2017  - Left ventricle: The cavity size was normal. Systolic function was   normal. The estimated ejection fraction was in the range of 60%   to 65%. Wall motion was normal; there were no regional wall   motion abnormalities. Doppler parameters are consistent with   abnormal left ventricular relaxation (grade 1 diastolic   dysfunction). - Aortic valve: Transvalvular velocity was within the normal range.   There was no stenosis. There was no regurgitation. - Mitral valve: Transvalvular velocity was within the normal range.   There was no evidence for stenosis. There was trivial   regurgitation. - Right ventricle: The cavity size was normal. Wall thickness was   normal. Systolic function was reduced. - Tricuspid valve: There was mild regurgitation. - Pulmonary arteries: Systolic pressure was within the normal   range. PA peak pressure: 24 mm Hg (S). - Pericardium, extracardiac: A trivial pericardial effusion was   identified.    Patient Profile     84 y.o. male  w/ hx CABG 2003,severe COPD with cor pulmonale, FEV1 0.58 in 2017, on 2 L oxygen and nocturnal ventilation, steroid dependent,uses trilogy machine,  HTN, HLD, PAD, and history of carotid obstructive disease, RBBB, was admitted 02/28 from the office with CHF.   Assessment & Plan    Principal Problem: 1.  Acute heart failure (HCC) - good UOP overnight, residual LE swelling on exam today - restarted lasix 20 mg iv BID, stable Crea, missed 2 doses of lasix because of hypotension, now 2 lbs up and more LE edema despite compression socks, increase lasix to 20 mg iv Q8H - Echo showed LVEF 60-65%, grade 1 DD, no pulmonary hypertension  2. NSTEMI - 0.33->0.52->0.54 with profound fatigue as a presentation - no CP - f/u echo showed LVEF 60-65% - hx CABG 2003 [place] to  LAD, SVG to OM1-2, SVG to RCA and PDA, negative cardiolite 2009 & ?2015 - the patient is not feeling better, previously active and exercising, now no energy, I offered a cardiac cath, the patient is very reluctant, alternative would be Lexiscan nuclear stress test, he wants to talk to his son Dr Jessie Foot, I tried to reach him at [phone] but he didn't answer.  Active Problems: 3.  Chronic obstructive pulmonary disease (HCC) - home O2 at 2lpm and nocturnal ventilation - no wheezing  4.  Tachycardia - SR, frequent ectopy w/ PACs and PVCs, underlying RBBB, now almost no ectopy - continue low-dose metoprolol  Signed, Ena Dawley, MD  10:45 AM 03/11/2017 Pager: [phone]

## 2017-03-11 NOTE — Progress Notes (Signed)
    Discussed options with the patient, his wife, and daughter at the bedside regarding further ischemic evaluation as outlined by Dr. Meda Coffee earlier today. He is in agreement to undergo a [place] for initial assessment. Will schedule for tomorrow morning. NPO after midnight.  Signed, Erma Heritage, PA-C 03/11/2017, 3:34 PM Pager: [phone]

## 2017-03-11 NOTE — Plan of Care (Signed)
  Progressing Safety: Ability to remain free from injury will improve 03/11/2017 0352 - Progressing by Ardine Eng, RN

## 2017-03-11 NOTE — Progress Notes (Signed)
[place] for heparin Indication: chest pain/ACS  Allergies  Allergen Reactions  . Sulfonamide Derivatives Swelling    REACTION: facial/tongue swelling  . Cefdinir Diarrhea    REACTION: diarrhea  . Ciprofloxacin Itching and Rash    Red itchy hands  . Nitrofurantoin Swelling    Swollen hands  . Levaquin [Levofloxacin] Itching and Rash    Patient Measurements: Height: 5\' 8"  (172.7 cm) Weight: 129 lb 3.2 oz (58.6 kg) IBW/kg (Calculated) : 68.4 Heparin Dosing Weight: 58.4 kg  Vital Signs: Temp: 97.4 F (36.3 C) (03/03 0501) Temp Source: Oral (03/03 0501) BP: 119/71 (03/03 0501) Pulse Rate: 70 (03/03 0501)  Labs: Recent Labs    03/08/17 1545 03/08/17 1727  03/09/17 0139 03/09/17 0832 03/10/17 0312 03/10/17 0951 03/10/17 1216 03/10/17 2358 03/11/17 0854  HGB 13.7  --   --  12.3*  --  11.7*  --   --   --  13.7  HCT 41.7  --   --  37.1*  --  34.6*  --   --   --  41.7  PLT 199  --   --  180  --  177  --   --   --  197  LABPROT  --   --   --  14.7  --   --   --   --   --   --   INR  --   --   --  1.16  --   --   --   --   --   --   HEPARINUNFRC  --   --    < > 0.36 0.31 0.26*  --  0.23* 0.58 0.71*  CREATININE 1.13  --   --  1.15  --   --  1.13  --   --   --   TROPONINI  --  0.33*  --  0.52* 0.54*  --   --   --   --   --    < > = values in this interval not displayed.    Estimated Creatinine Clearance: 39.6 mL/min (by C-G formula based on SCr of 1.13 mg/dL).   Medical History: Past Medical History:  Diagnosis Date  . Asthma   . BPH (benign prostatic hyperplasia)    severe; s/p multiple biopsies  . CAD (coronary artery disease)   . Carotid artery occlusion   . Chronic rhinitis   . COPD (chronic obstructive pulmonary disease) (HCC)    2L [place] O2  . Elevated troponin 03/09/2017  . Hyperlipidemia   . Hypertension   . On home oxygen therapy    "~ 24/7" (02/08/2017)  . Peripheral vascular disease ([place])   . Syncope and collapse    . Ureteral tumor 08/2015   had endoscopic procedure for evaluation, unable to reach for biopsy    Assessment: 70 yom presenting with increased SOB and bilateral lower extremity swelling with hx of EF 50%.  Pharmacy to dose heparin for ACS. CTA negative for PE.  Heparin level slightly supratherapeutic this morning at 0.71. CBC stable. No issues with drip or bleeding per RN.  Goal of Therapy:  Heparin level 0.3-0.7 units/ml Monitor platelets by anticoagulation protocol: Yes   Plan:  -Decrease heparin infusion to 900 units/hr -Heparin level daily with CBC daily -F/u Cardiology plans  Elicia Lamp, PharmD, BCPS Clinical Pharmacist Clinical phone for 03/11/2017 until 3:30pm: [phone] If after 3:30pm, please call main pharmacy at: [phone] 03/11/2017 10:32 AM

## 2017-03-12 ENCOUNTER — Inpatient Hospital Stay (HOSPITAL_COMMUNITY): Payer: Medicare Other

## 2017-03-12 DIAGNOSIS — J438 Other emphysema: Secondary | ICD-10-CM

## 2017-03-12 DIAGNOSIS — R079 Chest pain, unspecified: Secondary | ICD-10-CM

## 2017-03-12 LAB — NM MYOCAR MULTI W/SPECT W/WALL MOTION / EF
Estimated workload: 1 METS
Exercise duration (min): 0 min
Exercise duration (sec): 0 s
MPHR: 135 {beats}/min
Peak HR: 118 {beats}/min
Percent HR: 87 %
RPE: 0
Rest HR: 88 {beats}/min

## 2017-03-12 LAB — CBC
HCT: 37.5 % — ABNORMAL LOW (ref 39.0–52.0)
Hemoglobin: 12.1 g/dL — ABNORMAL LOW (ref 13.0–17.0)
MCH: 31.8 pg (ref 26.0–34.0)
MCHC: 32.3 g/dL (ref 30.0–36.0)
MCV: 98.7 fL (ref 78.0–100.0)
Platelets: 184 10*3/uL (ref 150–400)
RBC: 3.8 MIL/uL — ABNORMAL LOW (ref 4.22–5.81)
RDW: 13.4 % (ref 11.5–15.5)
WBC: 8.3 10*3/uL (ref 4.0–10.5)

## 2017-03-12 LAB — BASIC METABOLIC PANEL
Anion gap: 11 (ref 5–15)
BUN: 24 mg/dL — ABNORMAL HIGH (ref 6–20)
CO2: 32 mmol/L (ref 22–32)
Calcium: 9.3 mg/dL (ref 8.9–10.3)
Chloride: 96 mmol/L — ABNORMAL LOW (ref 101–111)
Creatinine, Ser: 1.18 mg/dL (ref 0.61–1.24)
GFR calc Af Amer: >60 mL/min (ref >60)
GFR calc non Af Amer: 54 mL/min — ABNORMAL LOW (ref >60)
Glucose, Bld: 88 mg/dL (ref 65–99)
Potassium: 4 mmol/L (ref 3.5–5.1)
Sodium: 139 mmol/L (ref 135–145)

## 2017-03-12 LAB — HEPARIN LEVEL (UNFRACTIONATED)
Heparin Unfractionated: 0.75 IU/mL — ABNORMAL HIGH (ref 0.30–0.70)
Heparin Unfractionated: 0.47 IU/mL (ref 0.30–0.70)

## 2017-03-12 MED ORDER — TECHNETIUM TC 99M TETROFOSMIN IV KIT
10.0000 | PACK | Freq: Once | INTRAVENOUS | Status: AC | PRN
  Administered 2017-03-12: 10 via INTRAVENOUS

## 2017-03-12 MED ORDER — AMINOPHYLLINE 25 MG/ML IV (NUC MED)
75.0000 mg | Freq: Once | INTRAVENOUS | Status: AC
  Administered 2017-03-12: 75 mg via INTRAVENOUS

## 2017-03-12 MED ORDER — REGADENOSON 0.4 MG/5ML IV SOLN
INTRAVENOUS | Status: AC
  Administered 2017-03-12: 0.4 mg via INTRAVENOUS
  Filled 2017-03-12: qty 5

## 2017-03-12 MED ORDER — REGADENOSON 0.4 MG/5ML IV SOLN
0.4000 mg | Freq: Once | INTRAVENOUS | Status: AC
  Administered 2017-03-12: 0.4 mg via INTRAVENOUS
  Filled 2017-03-12: qty 5

## 2017-03-12 MED ORDER — TECHNETIUM TC 99M TETROFOSMIN IV KIT
30.0000 | PACK | Freq: Once | INTRAVENOUS | Status: AC | PRN
  Administered 2017-03-12: 30 via INTRAVENOUS

## 2017-03-12 MED ORDER — POLYETHYLENE GLYCOL 3350 17 G PO PACK: 17.0000 g | PACK | Freq: Every day | ORAL | 0 refills | Status: DC | PRN

## 2017-03-12 MED ORDER — HEPARIN (PORCINE) IN NACL 100-0.45 UNIT/ML-% IJ SOLN
850.0000 [IU]/h | INTRAMUSCULAR | Status: DC
  Administered 2017-03-12: 850 [IU]/h via INTRAVENOUS
  Filled 2017-03-12: qty 250

## 2017-03-12 MED ORDER — AMINOPHYLLINE 25 MG/ML IV SOLN
INTRAVENOUS | Status: AC
  Administered 2017-03-12: 75 mg via INTRAVENOUS
  Filled 2017-03-12: qty 10

## 2017-03-12 NOTE — Discharge Summary (Addendum)
Discharge Summary    Patient ID: [name],  MRN: [id number], DOB/AGE: 09/02/1929 84 y.o.  Admit date: 03/08/2017 Discharge date: 03/12/2017  Primary Care Provider: [name] Primary Cardiologist: Sinclair Grooms, MD  Discharge Diagnoses    Principal Problem:   Acute heart failure [facility]) Active Problems:   Chronic obstructive pulmonary disease ([place])   Tachycardia   Elevated troponin   Acute on chronic diastolic heart failure ([place])   Coronary artery disease due to lipid rich plaque   NSTEMI (non-ST elevated myocardial infarction) ([place])   Allergies Allergies  Allergen Reactions  . Sulfonamide Derivatives Swelling    REACTION: facial/tongue swelling  . Cefdinir Diarrhea    REACTION: diarrhea  . Ciprofloxacin Itching and Rash    Red itchy hands  . Nitrofurantoin Swelling    Swollen hands  . Levaquin [Levofloxacin] Itching and Rash    Diagnostic Studies/Procedures   ECHO 03/10/27  Study Conclusions  - Left ventricle: The cavity size was normal. Systolic function was   normal. The estimated ejection fraction was in the range of 60%   to 65%. Wall motion was normal; there were no regional wall   motion abnormalities. Doppler parameters are consistent with   abnormal left ventricular relaxation (grade 1 diastolic   dysfunction). - Aortic valve: Transvalvular velocity was within the normal range.   There was no stenosis. There was no regurgitation. - Mitral valve: Transvalvular velocity was within the normal range.   There was no evidence for stenosis. There was trivial   regurgitation. - Right ventricle: The cavity size was normal. Wall thickness was   normal. Systolic function was reduced. - Tricuspid valve: There was mild regurgitation. - Pulmonary arteries: Systolic pressure was within the normal   range. PA peak pressure: 24 mm Hg (S). - Pericardium, extracardiac: A trivial pericardial effusion was   identified.   03/12/17 nuc study    IMPRESSION: 1. Large infarct involving the inferior wall including the inferior apex. Difficult to exclude peri-infarct ischemia at the apex and recommend correlation with ECG testing.  2. Abnormal wall motion along the inferior wall base and septal wall as described.  3. Left ventricular ejection fraction is 48%.  4. Non invasive risk stratification*: Intermediate  _____________   History of Present Illness     84 y.o.malewho presents for edema, after flu last month. Also palpitations. Also orthostatic hypotension. After 3 days of lasix --though he has had this in the past.   Last seen by Dr. Tamala Julian 11/14/16  CAD with prior coronary bypass grafting,severeCOPD with cor pulmonale,FEV1 0.58 in 2017, on 2 L oxygen and nocturnal ventilation, steroid dependentwith home oxygen,uses trilogy machine,hyperlipidemia, hypertension, PAD, and history of carotid obstructive disease, hx RBBB  Pt was hospitalized with influenza A, ST with RBBB on EKG and troponin poc was neg. When he came home he was doing well but this week he has felt very weak. Can hardly get around. He was taking his lasix but then BP would drop to 80 systolic,so he has not had since the 23rd. His wt is up 3 lbs 03/08/17. Unsure why sudden change in condition. His HR was rapid and with premature beats vs. PACs. Some chest discomfort but with deep breaths and turning head back and forth it resolves. He is not afraid to go home but is concerned but his wife is more afraid. He is on oxygen 24/7 and triology at night.  Back on usual dose of steroids.  With his  lower ext edema and weakness Dr. Saunders Revel saw him with me in the office and was admitted.  His troponins were elevated so IV heparin given.  He has chronic RBBB.  Dr. Marlou Porch has seen and evaluated pt and found him stable for discharge.    Hospital Course     Consultants: None  He was placed on IV lasix with good diuresis, though BP lower so lasix  dose decreased.   His CTA was neg for PE.  He continued on oxygen.as at home.  With treatment for his HF his PACs improved.  Troponin pk at 0.54.    With more fatigue on the 3rd pt was scheduled for lexiscan myoview.  Pt and family did not wish for cath.  Echo 60-65% G1DD, no pulmonary HTN.  He is still weak, but wishes to go home. Will arrange home health PT.   He will remain on BB.    He is neg 5,418 and wt is 126 lbs at discharge.  He will be on lasix 20 mg daily but if wt > 3 lbs he will increase lasix.  To 20 mg BID for 3 days.  Close follow up with Dr. Tamala Julian and Dr. [name].  Dr. Marlou Porch talked to pt's son a radiologist.  If angina or anginal equivalent increases -not much room to add meds with BP, then cath may be possibility.       _____________  Discharge Vitals Blood pressure 112/68, pulse 91, temperature 97.8 F (36.6 C), temperature source Oral, resp. rate 20, height 5\' 8"  (1.727 m), weight 126 lb 9.6 oz (57.4 kg), SpO2 100 %.  Filed Weights   03/10/17 0515 03/11/17 0501 03/12/17 0504  Weight: 128 lb 12.8 oz (58.4 kg) 129 lb 3.2 oz (58.6 kg) 126 lb 9.6 oz (57.4 kg)    Labs & Radiologic Studies    CBC Recent Labs    03/11/17 0854 03/12/17 0421  WBC 8.3 8.3  HGB 13.7 12.1*  HCT 41.7 37.5*  MCV 99.3 98.7  PLT 197 354   Basic Metabolic Panel Recent Labs    03/11/17 0854 03/12/17 0421  NA 138 139  K 4.2 4.0  CL 96* 96*  CO2 32 32  GLUCOSE 112* 88  BUN 19 24*  CREATININE 1.13 1.18  CALCIUM 9.6 9.3   Liver Function Tests No results for input(s): AST, ALT, ALKPHOS, BILITOT, PROT, ALBUMIN in the last 72 hours. No results for input(s): LIPASE, AMYLASE in the last 72 hours. Cardiac Enzymes No results for input(s): CKTOTAL, CKMB, CKMBINDEX, TROPONINI in the last 72 hours. BNP Invalid input(s): POCBNP D-Dimer No results for input(s): DDIMER in the last 72 hours. Hemoglobin A1C No results for input(s): HGBA1C in the last 72 hours. Fasting Lipid Panel No results for  input(s): CHOL, HDL, LDLCALC, TRIG, CHOLHDL, LDLDIRECT in the last 72 hours. Thyroid Function Tests No results for input(s): TSH, T4TOTAL, T3FREE, THYROIDAB in the last 72 hours.  Invalid input(s): FREET3 _____________  Dg Chest 2 View  Result Date: 03/08/2017 CLINICAL DATA:  Chest pain and shortness of breath. EXAM: CHEST  2 VIEW COMPARISON:  Chest x-ray dated February 14, 2017. FINDINGS: The heart size and mediastinal contours are within normal limits. Prior CABG. Normal pulmonary vascularity. Atherosclerotic calcification of the aortic arch. The lungs remain hyperinflated. No focal consolidation, pleural effusion, or pneumothorax. No acute osseous abnormality. IMPRESSION: 1. COPD.  No active cardiopulmonary disease. Electronically Signed   By: Titus Dubin M.D.   On: 03/08/2017 17:14   Dg  Chest 2 View  Result Date: 02/14/2017 CLINICAL DATA:  Shortness of breath.  COPD. EXAM: CHEST  2 VIEW COMPARISON:  02/08/2017 FINDINGS: The heart size and pulmonary vascularity are normal. CABG. Hyperinflated lungs with flattening of the diaphragm. No infiltrates or effusions. No acute bone abnormality. Aortic atherosclerosis IMPRESSION: No acute abnormality. Aortic Atherosclerosis (ICD10-I70.0) and Emphysema (ICD10-J43.9). Electronically Signed   By: Lorriane Shire M.D.   On: 02/14/2017 16:12   Ct Angio Chest Pe W/cm &/or Wo Cm  Result Date: 03/08/2017 CLINICAL DATA:  Intermittent chest pain. Palpitations. History of COPD, hypertension, hyperlipidemia. EXAM: CT ANGIOGRAPHY CHEST WITH CONTRAST TECHNIQUE: Multidetector CT imaging of the chest was performed using the standard protocol during bolus administration of intravenous contrast. Multiplanar CT image reconstructions and MIPs were obtained to evaluate the vascular anatomy. CONTRAST:  <See Chart> ISOVUE-370 IOPAMIDOL (ISOVUE-370) INJECTION 76% COMPARISON:  Chest radiograph March 08, 2017 FINDINGS: CARDIOVASCULAR: Adequate contrast opacification of the  pulmonary artery's. Main pulmonary artery is not enlarged. Mild stenosis versus respiratory motion through pulmonary arterial origin. No pulmonary arterial filling defects to the level of the subsegmental branches. Heart size is normal, no right heart strain. No pericardial effusion. Thoracic aorta is normal course and caliber, moderate calcific atherosclerosis aortic arch. Severe coronary artery calcifications. MEDIASTINUM/NODES: No lymphadenopathy by CT size criteria. LUNGS/PLEURA: Tracheobronchial tree is patent, no pneumothorax. No pleural effusions, focal consolidations, pulmonary nodules or masses. Punctate calcified granulomas. Biapical pleuroparenchymal scarring. Mild RIGHT apical bullous changes. Moderate to severe centrilobular emphysema. Bibasilar atelectasis/scarring. UPPER ABDOMEN: Mild LEFT hydronephrosis. RIGHT extra renal pelvis. Punctate RIGHT nephrolithiasis. Mildly thickened adrenal glands associated with hyperplasia. MUSCULOSKELETAL: Old median sternotomy. Severe LEFT greater than RIGHT glenohumeral osteoarthrosis. Osteopenia. Multilevel Schmorl's nodes. Review of the MIP images confirms the above findings. IMPRESSION: 1. No acute pulmonary embolism. 2. Emphysema with bibasilar atelectasis/scarring. 3. Mild LEFT hydronephrosis. Aortic Atherosclerosis (ICD10-I70.0) and Emphysema (ICD10-J43.9). Electronically Signed   By: Elon Alas M.D.   On: 03/08/2017 18:42   Nm Myocar Multi W/spect W/wall Motion / Ef  Result Date: 03/12/2017 CLINICAL DATA:  84 year old with coronary artery disease evaluation, post revascularization. Chest pain. Prior CABG procedure. EXAM: MYOCARDIAL IMAGING WITH SPECT (REST AND PHARMACOLOGIC-STRESS) GATED LEFT VENTRICULAR WALL MOTION STUDY LEFT VENTRICULAR EJECTION FRACTION TECHNIQUE: Standard myocardial SPECT imaging was performed after resting intravenous injection of 10 mCi Tc-48m tetrofosmin. Subsequently, intravenous infusion of Lexiscan was performed under the  supervision of the Cardiology staff. At peak effect of the drug, 30 mCi Tc-38m tetrofosmin was injected intravenously and standard myocardial SPECT imaging was performed. Quantitative gated imaging was also performed to evaluate left ventricular wall motion, and estimate left ventricular ejection fraction. COMPARISON:  None. FINDINGS: Perfusion: Large infarct involving the inferior wall including the inferior apex. Difficult to exclude peri-infarct ischemia along the apex. No other areas are concerning for reversible ischemia. Wall Motion: Paradoxical motion along the base of the inferior wall. Hypokinesia along the septal wall which may be secondary to the CABG procedure. Left Ventricular Ejection Fraction: 46 % End diastolic volume 78 ml End systolic volume 43 ml IMPRESSION: 1. Large infarct involving the inferior wall including the inferior apex. Difficult to exclude peri-infarct ischemia at the apex and recommend correlation with ECG testing. 2. Abnormal wall motion along the inferior wall base and septal wall as described. 3. Left ventricular ejection fraction is 48%. 4. Non invasive risk stratification*: Intermediate *2012 Appropriate Use Criteria for Coronary Revascularization Focused Update: J Am Coll Cardiol. 8295;62(1):308-657. http://content.[email or web address].aspx?articleid=[email or web address] Electronically Signed   By: Quita Skye  Anselm Pancoast M.D.   On: 03/12/2017 16:21   Disposition   Pt is being discharged home today in improved condition.  Follow-up Plans & Appointments  Weigh daily and increase lasix to 20 mg twice a day for 3 days then back to 20 mg daily, if wt goes up 3 lbs do not do more than once a week, if weight continues to increase call Dr. Thompson Caul office.    We have ordered home physical therapy.    Heart Healthy low salt diet.  Wear support stockings.  Best to put on first thing in the morning before getting out of bed.    Keep follow up with Dr. [name].     Follow-up Information    Belva Crome, MD Follow up on 03/21/2017.   Specialty:  Cardiology Why:  at 2:00PM  Contact information: [address] [place] [address] [phone]        Tanda Rockers, MD Follow up on 03/22/2017.   Specialty:  Pulmonary Disease Why:  at 11:30 AM Contact information: [address] [place] [address] [phone]            Discharge Medications   Allergies as of 03/12/2017      Reactions   Sulfonamide Derivatives Swelling   REACTION: facial/tongue swelling   Cefdinir Diarrhea   REACTION: diarrhea   Ciprofloxacin Itching, Rash   Red itchy hands   Nitrofurantoin Swelling   Swollen hands   Levaquin [levofloxacin] Itching, Rash      Medication List    TAKE these medications   ALPRAZolam 0.25 MG tablet Commonly known as:  XANAX Take 1 tablet (0.25 mg total) by mouth 3 (three) times daily. Takes 1/2 tablet two times daily. What changed:    when to take this  additional instructions   aspirin 81 MG tablet Take 81 mg by mouth daily.   atorvastatin 20 MG tablet Commonly known as:  LIPITOR TAKE 1 TABLET BY MOUTH EVERY DAY What changed:    how much to take  how to take this  when to take this   b complex vitamins tablet Take 1 tablet by mouth daily.   finasteride 5 MG tablet Commonly known as:  PROSCAR Take 5 mg by mouth every morning.   furosemide 20 MG tablet Commonly known as:  LASIX Take 1 tablet (20 mg total) by mouth daily. Can take one additional pill if needed for swelling.   guaiFENesin 600 MG 12 hr tablet Commonly known as:  MUCINEX Take 600 mg by mouth 2 (two) times daily as needed for cough.   mometasone 50 MCG/ACT nasal spray Commonly known as:  NASONEX SHAKE LIQUID AND USE 2 SPRAYS IN EACH NOSTRIL DAILY   MURO 128 OP Place 1 drop 4 (four) times daily into both eyes.   nitroGLYCERIN 0.4 MG SL tablet Commonly known as:  NITROSTAT DISSOLVE 1 TABLET UNDER THE TONGUE AS DIRECTED AND AS NEEDED FOR CHEST  PAIN   OXYGEN Inhale 2 L continuous into the lungs.   polyethylene glycol packet Commonly known as:  MIRALAX / GLYCOLAX Take 17 g by mouth daily as needed.   predniSONE 10 MG tablet Commonly known as:  DELTASONE Take 10 mg by mouth daily with breakfast.   PRESERVISION AREDS 2 Caps Take 1 capsule by mouth daily.   ranitidine 150 MG tablet Commonly known as:  ZANTAC Take 150 mg by mouth 2 (two) times daily.   SPIRIVA RESPIMAT 2.5 MCG/ACT Aers Generic drug:  Tiotropium Bromide Monohydrate INHALE 2 PUFFS BY MOUTH EVERY DAY   SYMBICORT 160-4.5 MCG/ACT inhaler Generic drug:  budesonide-formoterol INHALE 2 PUFFS BY MOUTH EVERY MORNING AND THEN ANOTHER 2 PUFFS ABOUT 12 HOURS LATER   TOPROL XL 25 MG 24 hr tablet Generic drug:  metoprolol succinate TAKE 1 TABLET BY MOUTH DAILY What changed:    how much to take  how to take this  when to take this   VITAMIN B 12 PO Take 1 tablet daily by mouth.   Vitamin D3 2000 units Tabs Take 2,000 Units by mouth daily.   XOPENEX HFA 45 MCG/ACT inhaler Generic drug:  levalbuterol INHALE 2 PUFFS BY MOUTH EVERY 4 HOURS AS NEEDED FOR WHEEZING OR SHORTNESS OF BREATH          Outstanding Labs/Studies   BMP   Duration of Discharge Encounter   Greater than 30 minutes including physician time.  Signed, Cecilie Kicks NP 03/12/2017, 6:01 PM  Personally seen and examined. Agree with above.  Discussed care plan. NUC stress with inferior infarct pattern (mild peri-infarct ischemia),EF by NUC 48%. No active CP, has baseline SOB. Trying to get his feet under him after stress test he states. ECHO EF 60%.   Spoke with his Son, Stage manager, at length.   OK with DC home with home PT  - lasix 20 QD  - low dose metoprolol 25 QD  COPD tx  - daily pred  CAD with angina  - not much room for Imdur from BP perspective.   - could consider cath in future if angina is unrelenting. He would prefer not to.   Acute diastolic heart  failure  - lasix 20 PO QD at home with daily weights  - If > 3 pounds, increase lasix to 20 BID for 3 days  Close follow up.   Candee Furbish, MD

## 2017-03-12 NOTE — Progress Notes (Signed)
Pt is in bedside with peep breathing  device from home. Blood pressure low stable, NPO for stress in the am.

## 2017-03-12 NOTE — Consult Note (Signed)
   [facility] CM Inpatient Consult   03/12/2017  IVO MOGA June 19, 1929 [id number]  Follow up:  Met with the patient, wife [name], and daughter Yetta Flock at the bedside regarding ongoing [facility] services.  Patient states that the COPD EMMI calls were, "amusing and helpful to some degree but I don't think I need them going forward."  Wife states, "I was getting confused with all of the 'cooks' in the kitchen and didn't know who did what. We had a knock on the door with Palliative Care [[facility]] who also came out and left their information. We didn't sign anything but we have their folder."  Patient states, "This [[facility]] was too pushy for me, I had just signed up for my physical therapy and having palliative show up, was for me,  something I am definitely not ready for, yet."   Daughter states, "We have the information and I will be trying to convince them it is a good program to consider."  Wife states she needs resource information for podiatry and for osteopathic medicine.  Encouraged wife to speak with her primary care provider and that [facility] can assist her with the list of specialist in these areas and she would have to make the decision, as resources are given. They consent to ongoing [facility] follow up.  Desires no EMMI calls.  For questions, please contact:  Natividad Brood, RN BSN [facility] Liaison  [phone] business mobile phone Toll free office [phone]

## 2017-03-12 NOTE — Progress Notes (Addendum)
Progress Note  Patient Name: [name] Date of Encounter: 03/12/2017  Primary Cardiologist: Sinclair Grooms, MD   Subjective   No chest pain, SOB is stable, anxious with stress test  Inpatient Medications    Scheduled Meds: . ALPRAZolam  0.25 mg Oral TID  . aspirin EC  81 mg Oral Daily  . atorvastatin  20 mg Oral Daily  . B-complex with vitamin C  1 tablet Oral Daily  . cholecalciferol  2,000 Units Oral Daily  . famotidine  20 mg Oral Daily  . finasteride  5 mg Oral q morning - 10a  . fluticasone  2 spray Each Nare Daily  . furosemide  20 mg Intravenous Q8H  . metoprolol succinate  25 mg Oral Daily  . mometasone-formoterol  2 puff Inhalation BID  . multivitamin  1 tablet Oral Daily  . polyethylene glycol  17 g Oral Daily  . predniSONE  10 mg Oral Q breakfast  . regadenoson  0.4 mg Intravenous Once  . tiotropium  1 capsule Inhalation q morning - 10a  . vitamin B-12  100 mcg Oral Daily   Continuous Infusions: . sodium chloride 10 mL/hr at 03/08/17 1848  . heparin 900 Units/hr (03/11/17 1630)   PRN Meds: acetaminophen, alum & mag hydroxide-simeth, levalbuterol, nitroGLYCERIN, ondansetron (ZOFRAN) IV   Vital Signs    Vitals:   03/11/17 2110 03/12/17 0052 03/12/17 0227 03/12/17 0504  BP: 118/60 90/60 124/63 110/76  Pulse:    81  Resp:    20  Temp:    97.8 F (36.6 C)  TempSrc:    Oral  SpO2:    98%  Weight:    126 lb 9.6 oz (57.4 kg)  Height:        Intake/Output Summary (Last 24 hours) at 03/12/2017 0856 Last data filed at 03/12/2017 0836 Gross per 24 hour  Intake 871.43 ml  Output 2450 ml  Net -1578.57 ml   Filed Weights   03/10/17 0515 03/11/17 0501 03/12/17 0504  Weight: 128 lb 12.8 oz (58.4 kg) 129 lb 3.2 oz (58.6 kg) 126 lb 9.6 oz (57.4 kg)    Telemetry    SR with PACs  - Personally Reviewed  ECG    No new - Personally Reviewed  Physical Exam   GEN: No acute distress.   Neck: No JVD Cardiac: RRR with premature beats, no murmurs, rubs,  or gallops.  Respiratory: Clear to auscultation bilaterally. Though diminished  GI: Soft, nontender, non-distended  MS: No edema of lower ext except of feet.; No deformity. Neuro:  Nonfocal  Psych: Normal affect   Labs    Chemistry Recent Labs  Lab 03/10/17 0951 03/11/17 0854 03/12/17 0421  NA 134* 138 139  K 3.6 4.2 4.0  CL 94* 96* 96*  CO2 30 32 32  GLUCOSE 121* 112* 88  BUN 17 19 24*  CREATININE 1.13 1.13 1.18  CALCIUM 9.0 9.6 9.3  GFRNONAA 57* 57* 54*  GFRAA >60 >60 >60  ANIONGAP 10 10 11      Hematology Recent Labs  Lab 03/10/17 0312 03/11/17 0854 03/12/17 0421  WBC 8.0 8.3 8.3  RBC 3.51* 4.20* 3.80*  HGB 11.7* 13.7 12.1*  HCT 34.6* 41.7 37.5*  MCV 98.6 99.3 98.7  MCH 33.3 32.6 31.8  MCHC 33.8 32.9 32.3  RDW 13.2 13.0 13.4  PLT 177 197 184    Cardiac Enzymes Recent Labs  Lab 03/08/17 1727 03/09/17 0139 03/09/17 0832  TROPONINI 0.33* 0.52* 0.54*  Recent Labs  Lab 03/08/17 1608  TROPIPOC 0.29*     BNP Recent Labs  Lab 03/08/17 1727  BNP 237.5*     DDimer No results for input(s): DDIMER in the last 168 hours.   Radiology    No results found.  Cardiac Studies  Echo  03/09/17 ECHO: 03/09/2017  - Left ventricle: The cavity size was normal. Systolic function was normal. The estimated ejection fraction was in the range of 60% to 65%. Wall motion was normal; there were no regional wall motion abnormalities. Doppler parameters are consistent with abnormal left ventricular relaxation (grade 1 diastolic dysfunction). - Aortic valve: Transvalvular velocity was within the normal range. There was no stenosis. There was no regurgitation. - Mitral valve: Transvalvular velocity was within the normal range. There was no evidence for stenosis. There was trivial regurgitation. - Right ventricle: The cavity size was normal. Wall thickness was normal. Systolic function was reduced. - Tricuspid valve: There was mild  regurgitation. - Pulmonary arteries: Systolic pressure was within the normal range. PA peak pressure: 24 mm Hg (S). - Pericardium, extracardiac: A trivial pericardial effusion was identified.     Patient Profile     84 y.o. male w/ hx CABG 2003,severeCOPD with cor pulmonale,FEV1 0.58 in 2017, on 2 L oxygen and nocturnal ventilation, steroid dependent,uses trilogy machine,HTN, HLD, PAD, and history of carotid obstructive disease, RBBB, was admitted 02/28 from the office with CHF/fatigue and multifocal atrial tach vs. SR with freq PACs.      Assessment & Plan    Acute Heart Failure Echo showed LVEF 60-65%, grade 1 DD, no pulmonary hypertension --with lasix BP drops  --neg 4918 since admit and wt down from 130 lbs to 126 lbs. --Cr is stable at 1.13  NSTEMI--hx CABG 2003 [place] to LAD, SVG to OM1-2, SVG to RCA and PDA, negative cardiolite 2009 & ?2015 --troponin pk 0.54 with profound fatigue --offered cardiac cath by Dr. Meda Coffee but pt is reluctant - lexiscan myoview today  --no chest pain --on IV Heparin --neg CTA for PE  SR with freq PACs- most on admit  Underlying RBBB on low dose BB  COPD  --home 02 24/7 and nocturnal ventilation   On CTA of chest- mild Lt hydronephrosis   FLU/ PNA in Jan, when first at home energy increased felt well then increased fatigue.   Hypotension at times with diuretics        For questions or updates, please contact [place] Please consult [email or web address] for contact info under Cardiology/STEMI.      Signed, Cecilie Kicks, NP  03/12/2017, 8:56 AM    Personally seen and examined. Agree with above.  Discussed care plan. NUC stress with inferior infarct pattern (mild peri-infarct ischemia),EF by NUC 48%. No active CP, has baseline SOB. Trying to get his feet under him after stress test he states. ECHO EF 60%.   Spoke with his Son, Stage manager, at length.   OK with DC home with home PT  - lasix 20 QD  - low dose metoprolol 25  QD  COPD tx  - daily pred  CAD with angina  - not much room for Imdur from BP perspective.   - could consider cath in future if angina is unrelenting. He would prefer not to.   Acute diastolic heart failure  - lasix 20 PO QD at home with daily weights  - If > 3 pounds, increase lasix to 20 BID for 3 days  Close follow up.   Candee Furbish, MD

## 2017-03-12 NOTE — Progress Notes (Signed)
Reviewed discharge instructions/medication with pt and pt's wife. Answered their questions. Case manager came up and set up PT for patient. Pt is stable and ready for discharge.

## 2017-03-12 NOTE — Progress Notes (Signed)
ED CM met with patient and spouse at bedside to discuss recommendations for [facility] services. Patient is currently active with [facility] receiving PT.,patient may benefit from nursing services as well patient and family agrees. RN has contacted discharging provider to add nursing to the order. CM will fax in the the referral to [facility]  once orders have been updated

## 2017-03-12 NOTE — Progress Notes (Signed)
[place] for Heparin Indication: chest pain/ACS  Allergies  Allergen Reactions  . Sulfonamide Derivatives Swelling    REACTION: facial/tongue swelling  . Cefdinir Diarrhea    REACTION: diarrhea  . Ciprofloxacin Itching and Rash    Red itchy hands  . Nitrofurantoin Swelling    Swollen hands  . Levaquin [Levofloxacin] Itching and Rash    Patient Measurements: Height: 5\' 8"  (172.7 cm) Weight: 126 lb 9.6 oz (57.4 kg) IBW/kg (Calculated) : 68.4 Heparin Dosing Weight: 58.4 kg  Vital Signs: Temp: 97.8 F (36.6 C) (03/04 0504) Temp Source: Oral (03/04 0504) BP: 110/76 (03/04 0504) Pulse Rate: 81 (03/04 0504)  Labs: Recent Labs    03/09/17 4166  03/10/17 0630 03/10/17 0951  03/10/17 2358 03/11/17 0854 03/12/17 0421  HGB  --    < > 11.7*  --   --   --  13.7 12.1*  HCT  --   --  34.6*  --   --   --  41.7 37.5*  PLT  --   --  177  --   --   --  197 184  HEPARINUNFRC 0.31  --  0.26*  --    < > 0.58 0.71* 0.75*  CREATININE  --   --   --  1.13  --   --  1.13 1.18  TROPONINI 0.54*  --   --   --   --   --   --   --    < > = values in this interval not displayed.    Estimated Creatinine Clearance: 37.2 mL/min (by C-G formula based on SCr of 1.18 mg/dL).   Medical History: Past Medical History:  Diagnosis Date  . Asthma   . BPH (benign prostatic hyperplasia)    severe; s/p multiple biopsies  . CAD (coronary artery disease)   . Carotid artery occlusion   . Chronic rhinitis   . COPD (chronic obstructive pulmonary disease) (HCC)    2L [place] O2  . Elevated troponin 03/09/2017  . Hyperlipidemia   . Hypertension   . On home oxygen therapy    "~ 24/7" (02/08/2017)  . Peripheral vascular disease ([place])   . Syncope and collapse   . Ureteral tumor 08/2015   had endoscopic procedure for evaluation, unable to reach for biopsy    Assessment: 40 yom presenting with increased SOB and bilateral lower extremity swelling with hx of EF 50%.   Pharmacy to dose heparin for ACS. CTA negative for PE.  Heparin level slightly supratherapeutic this morning at 0.75  Goal of Therapy:  Heparin level 0.3-0.7 units/ml Monitor platelets by anticoagulation protocol: Yes   Plan:  -Decrease heparin infusion to 750 units/hr -1500 HL -Heparin level daily with CBC daily -F/u Cardiology plans  Narda Bonds, PharmD, [place] Pharmacist Phone: [phone]

## 2017-03-12 NOTE — Discharge Instructions (Signed)
Weigh daily and increase lasix to 20 mg twice a day for 3 days then back to 20 mg daily, if wt goes up 3 lbs do not do more than once a week, if weight continues to increase call Dr. Thompson Caul office.    We have ordered home physical therapy.    Heart Healthy low salt diet.  Wear support stockings.  Best to put on first thing in the morning before getting out of bed.    Keep follow up with Dr. [name].    Use device at night as well Triology as before

## 2017-03-12 NOTE — Progress Notes (Signed)
[place] for Heparin Indication: chest pain/ACS  Allergies  Allergen Reactions  . Sulfonamide Derivatives Swelling    REACTION: facial/tongue swelling  . Cefdinir Diarrhea    REACTION: diarrhea  . Ciprofloxacin Itching and Rash    Red itchy hands  . Nitrofurantoin Swelling    Swollen hands  . Levaquin [Levofloxacin] Itching and Rash    Patient Measurements: Height: 5\' 8"  (172.7 cm) Weight: 126 lb 9.6 oz (57.4 kg) IBW/kg (Calculated) : 68.4 Heparin Dosing Weight: 58.4 kg  Vital Signs: Temp: 97.8 F (36.6 C) (03/04 0504) Temp Source: Oral (03/04 0504) BP: 112/68 (03/04 1208) Pulse Rate: 91 (03/04 1208)  Labs: Recent Labs    03/10/17 0312 03/10/17 0951  03/11/17 0854 03/12/17 0421 03/12/17 1454  HGB 11.7*  --   --  13.7 12.1*  --   HCT 34.6*  --   --  41.7 37.5*  --   PLT 177  --   --  197 184  --   HEPARINUNFRC 0.26*  --    < > 0.71* 0.75* 0.47  CREATININE  --  1.13  --  1.13 1.18  --    < > = values in this interval not displayed.    Estimated Creatinine Clearance: 37.2 mL/min (by C-G formula based on SCr of 1.18 mg/dL).   Medical History: Past Medical History:  Diagnosis Date  . Asthma   . BPH (benign prostatic hyperplasia)    severe; s/p multiple biopsies  . CAD (coronary artery disease)   . Carotid artery occlusion   . Chronic rhinitis   . COPD (chronic obstructive pulmonary disease) (HCC)    2L [place] O2  . Elevated troponin 03/09/2017  . Hyperlipidemia   . Hypertension   . On home oxygen therapy    "~ 24/7" (02/08/2017)  . Peripheral vascular disease ([place])   . Syncope and collapse   . Ureteral tumor 08/2015   had endoscopic procedure for evaluation, unable to reach for biopsy    Assessment: 35 yom presented with increased SOB and bilateral lower extremity swelling with hx of EF 50%.  Pharmacy dosing IV heparin for ACS. CTA negative for PE.  Heparin level therapeutic at 0.47 but drawn only 2 hr after heparin  rate decreased to 750 units/h when patient returned to 3E from nuclear stress test this AM. Heparin was still infusing at 900 units/hr during stress test until 13:00 today. Therapeutic level reflects mix of both rates. No bleeding notted per RN's report.   Goal of Therapy:  Heparin level 0.3-0.7 units/ml Monitor platelets by anticoagulation protocol: Yes   Plan:  -Increase heparin infusion to 850 units/hr -6hour  HL -Heparin level daily with CBC daily -F/u Cardiology plans  Nicole Cella, RPh Clinical Pharmacist Pager: [phone] 03/12/2017 4:49 PM

## 2017-03-12 NOTE — Progress Notes (Signed)
PT Cancellation Note  Patient Details Name: [name] MRN: [id number] DOB: 12/06/1929   Cancelled Treatment:    Reason Eval/Treat Not Completed: Patient at procedure or test/unavailable Patient at procedure (nuclear medicine for stress test) this morning, also note recently high troponins without downtrend per review of labs. Awaiting response from attending MD regarding PT in face of high troponins as well. Hold for now, plan to attempt to return as/if able and when patient is able to safely participate in [place] PT, DPT, CBIS  Supplemental Physical Therapist [facility]   Pager [phone]

## 2017-03-13 ENCOUNTER — Encounter: Payer: Self-pay | Admitting: *Deleted

## 2017-03-13 ENCOUNTER — Other Ambulatory Visit: Payer: Self-pay | Admitting: *Deleted

## 2017-03-13 NOTE — Patient Outreach (Addendum)
[facility]) Care Management  03/13/2017  [name] August 15, 1929 [id number]    Transition of care  (Initial d/c 3/4)  RN spoke with the pt today and reintroduced the [facility] services and purpose for today's call. Pt very receptive and discussed his upcoming appointments and indicated he has all his medications but did not wish to review. Pt states he has his medication listed and organized for daily administration and takes his list of medications to his providers on every appointment (no issues and no changes upon his discharge). RN able to completed the transition of care template and discussed the previous plan of care along with the add plan with HF education goals and adjusted the interventions accordingly based upon his recent hospitalization. The plan of care would apply and pt receptive to this plan of care along with prevention measures for both the Flu and HF. Verified pt has [facility] that will resume with PT services. RN offered to assist further with possible home visits to address any of his medical issues more directly however pt indicates he is doing much better and would like to the opportunity to possible consider this option within the next week if he feels he needs a one-on-one. RN offered to continue transition of care weekly calls until he feels he need further engagement with managing his care. Pt very appreciative and agreed to the weekly contacts at this time. Will scheduled another follow up call next week and reiterated on the plan of care for pt's understanding. No further inquires or request at this time. Again pt declined the option to review his medications but indicates he is taking all that is ordered/prescribed.  [name], RN Care Management Coordinator [place] Office [phone]

## 2017-03-14 ENCOUNTER — Other Ambulatory Visit: Payer: Self-pay | Admitting: *Deleted

## 2017-03-14 DIAGNOSIS — J441 Chronic obstructive pulmonary disease with (acute) exacerbation: Secondary | ICD-10-CM | POA: Diagnosis not present

## 2017-03-14 DIAGNOSIS — I10 Essential (primary) hypertension: Secondary | ICD-10-CM | POA: Diagnosis not present

## 2017-03-14 DIAGNOSIS — J9612 Chronic respiratory failure with hypercapnia: Secondary | ICD-10-CM | POA: Diagnosis not present

## 2017-03-14 DIAGNOSIS — I2510 Atherosclerotic heart disease of native coronary artery without angina pectoris: Secondary | ICD-10-CM | POA: Diagnosis not present

## 2017-03-14 DIAGNOSIS — J9611 Chronic respiratory failure with hypoxia: Secondary | ICD-10-CM | POA: Diagnosis not present

## 2017-03-14 DIAGNOSIS — J09X2 Influenza due to identified novel influenza A virus with other respiratory manifestations: Secondary | ICD-10-CM | POA: Diagnosis not present

## 2017-03-14 NOTE — Patient Outreach (Signed)
[facility]) Care Management  03/14/2017  [name] 12/16/1929 [id number]   Care Coordination Call   RN received a voice message yesterday evening from the pt's wife requested RN to assist with finding her a podiatrist and a osteopathic provider. RN contacted pt's primary provider (Dr. Harrington Challenger) today and spoke with [name] with this information and informed the office that RN would contact the pt directly on providers who need to be in network with pt's insurance. RN also requested to provider's office to contact the pt and caregiver for details on this request.   RN contacted the pt who in turn provided RN permission to speak with his wife concerning this request. RN informed wife she needed to find a provider in network to avoid additional out of network fees if pt was referred to a provider not in the rim of networking providers. Wife indicated they would be a [facility] provider. RN requested wife to contact that provider's office and verify coverage with the pt's insurance prior to engaging in the referral process and verify this with Dr. Alan Ripper office prior to the official referral.  Wife verbalized an understanding and will follow up accordingly. No additional request as wife very grateful for the assistance provided today.  [name], RN Care Management Coordinator [place] Office [phone]

## 2017-03-15 ENCOUNTER — Telehealth: Payer: Self-pay | Admitting: Interventional Cardiology

## 2017-03-15 NOTE — Telephone Encounter (Addendum)
Dr. Tamala Julian recommended pt try a Nitro.  Called pt and he states that chest tightness has resolved for now but he was more concerned about his irregular heartbeat.  Pt states when he checks it manually it feels like there is a pause.  Spoke with pt about PAC's.  Advised for pt to try a Nitro if chest tightness returns.  Pt denies any issues at the current moment and feels better after information given.  Advised I would send message to Dr. Tamala Julian and if he has further recommendations I would call back, otherwise we would see pt next Wednesday as planned.

## 2017-03-15 NOTE — Telephone Encounter (Signed)
Spoke with patient who is reporting he has been having some chest tightness that comes and goes, off and on all day today since he got up this AM.  The tightness is in his left upper chest with no radiation. Nothing makes it any worse or better.  He is also having some SOB with activity. He is c/o an irregular heartbeat with a rate of around 92.  He denies any HX of At FI[facility] in the past, only some PACs.  He has been taking his medications as instructed since D/C and his wts have been stable but may be up a pound today.  Advised I will have Dr Tamala Julian review this information and c/b with further instructions.  Pt states understanding.

## 2017-03-15 NOTE — Telephone Encounter (Signed)
New message  Patient calling concerns of dizziness and irregular heartbeat. HR 92 today  Pt c/o of Chest Pain: STAT if CP now or developed within 24 hours  1. Are you having CP right now? YES  2. Are you experiencing any other symptoms (ex. SOB, nausea, vomiting, sweating)? DIZZINESS  3. How long have you been experiencing CP? 1 DAY  4. Is your CP continuous or coming and going? COMING AND GOING  5. Have you taken Nitroglycerin? NO     ?

## 2017-03-16 ENCOUNTER — Ambulatory Visit: Payer: Self-pay | Admitting: *Deleted

## 2017-03-16 DIAGNOSIS — J9612 Chronic respiratory failure with hypercapnia: Secondary | ICD-10-CM | POA: Diagnosis not present

## 2017-03-16 DIAGNOSIS — I10 Essential (primary) hypertension: Secondary | ICD-10-CM | POA: Diagnosis not present

## 2017-03-16 DIAGNOSIS — J9611 Chronic respiratory failure with hypoxia: Secondary | ICD-10-CM | POA: Diagnosis not present

## 2017-03-16 DIAGNOSIS — I2510 Atherosclerotic heart disease of native coronary artery without angina pectoris: Secondary | ICD-10-CM | POA: Diagnosis not present

## 2017-03-16 DIAGNOSIS — J441 Chronic obstructive pulmonary disease with (acute) exacerbation: Secondary | ICD-10-CM | POA: Diagnosis not present

## 2017-03-16 DIAGNOSIS — J09X2 Influenza due to identified novel influenza A virus with other respiratory manifestations: Secondary | ICD-10-CM | POA: Diagnosis not present

## 2017-03-19 ENCOUNTER — Other Ambulatory Visit: Payer: Self-pay | Admitting: *Deleted

## 2017-03-19 DIAGNOSIS — J9611 Chronic respiratory failure with hypoxia: Secondary | ICD-10-CM | POA: Diagnosis not present

## 2017-03-19 DIAGNOSIS — I10 Essential (primary) hypertension: Secondary | ICD-10-CM | POA: Diagnosis not present

## 2017-03-19 DIAGNOSIS — J09X2 Influenza due to identified novel influenza A virus with other respiratory manifestations: Secondary | ICD-10-CM | POA: Diagnosis not present

## 2017-03-19 DIAGNOSIS — J441 Chronic obstructive pulmonary disease with (acute) exacerbation: Secondary | ICD-10-CM | POA: Diagnosis not present

## 2017-03-19 DIAGNOSIS — J9612 Chronic respiratory failure with hypercapnia: Secondary | ICD-10-CM | POA: Diagnosis not present

## 2017-03-19 DIAGNOSIS — I2510 Atherosclerotic heart disease of native coronary artery without angina pectoris: Secondary | ICD-10-CM | POA: Diagnosis not present

## 2017-03-19 NOTE — Patient Outreach (Signed)
[facility]) Care Management  03/19/2017  [name] [birth year] [id number]    Transition of care (unsuccessful)  RN attempted to contact pt however unsuccessful and unable to leave a voice message. Will attempt another call back tomorrow for ongoing transition of care.   [name], RN Care Management Coordinator [place] Office [phone]

## 2017-03-20 ENCOUNTER — Other Ambulatory Visit: Payer: Self-pay | Admitting: *Deleted

## 2017-03-20 ENCOUNTER — Ambulatory Visit: Payer: Medicare Other | Admitting: *Deleted

## 2017-03-20 ENCOUNTER — Encounter: Payer: Self-pay | Admitting: *Deleted

## 2017-03-20 NOTE — Progress Notes (Signed)
Cardiology Office Note    Date:  03/21/2017   ID:  [name], DOB [birth year], MRN [id number]  PCP:  [name], MD  Cardiologist: Sinclair Grooms, MD   Chief Complaint  Patient presents with  . Congestive Heart Failure  . COPD    History of Present Illness:  [name] is a 84 y.o. male who presents for CAD with prior coronary bypass grafting, COPD with cor pulmonale, hyperlipidemia, hypertension, PAD, and history of carotid obstructive disease   Feels better since diuresis.  He is concerned about the size of the heart attack that he had.  They are concerned about whether he needs Lasix every day.  Generally speaking, his breathing is been better since diuresis.  He does feel weak and dizzy.  He feels weaker and more dizzy with standing.  He denies chest pain.  He has not needed nitroglycerin.   Past Medical History:  Diagnosis Date  . Asthma   . BPH (benign prostatic hyperplasia)    severe; s/p multiple biopsies  . CAD (coronary artery disease)   . Carotid artery occlusion   . Chronic rhinitis   . COPD (chronic obstructive pulmonary disease) (HCC)    2L [place] O2  . Elevated troponin 03/09/2017  . Hyperlipidemia   . Hypertension   . On home oxygen therapy    "~ 24/7" (02/08/2017)  . Peripheral vascular disease ([place])   . Syncope and collapse   . Ureteral tumor 08/2015   had endoscopic procedure for evaluation, unable to reach for biopsy    Past Surgical History:  Procedure Laterality Date  . CATARACT EXTRACTION  11-20-11  . CORONARY ARTERY BYPASS GRAFT  10/2001   "CABG X4"  . LITHOTRIPSY    . PROSTATE BIOPSY  Oct. 2014    Current Medications: Outpatient Medications Prior to Visit  Medication Sig Dispense Refill  . ALPRAZolam (XANAX) 0.25 MG tablet Take half (1/2) tablet by mouth three (3) times daily.    Marland Kitchen aspirin 81 MG tablet Take 81 mg by mouth daily.    Marland Kitchen atorvastatin (LIPITOR) 20 MG tablet TAKE 1 TABLET BY MOUTH EVERY DAY (Patient taking  differently: TAKE 1 TABLET (20mg ) BY MOUTH EVERY DAY) 90 tablet 2  . Cholecalciferol (VITAMIN D3) 2000 units TABS Take 2,000 Units by mouth daily.    . Cyanocobalamin (VITAMIN B 12 PO) Take 1 tablet daily by mouth.     . finasteride (PROSCAR) 5 MG tablet Take 5 mg by mouth every morning.    . furosemide (LASIX) 20 MG tablet Take 1 tablet (20 mg total) by mouth daily. Can take one additional pill if needed for swelling. 45 tablet 1  . guaiFENesin (MUCINEX) 600 MG 12 hr tablet Take 600 mg by mouth 2 (two) times daily as needed for cough.     . mometasone (NASONEX) 50 MCG/ACT nasal spray SHAKE LIQUID AND USE 2 SPRAYS IN EACH NOSTRIL DAILY 17 g 11  . Multiple Vitamins-Minerals (PRESERVISION AREDS 2) CAPS Take 1 capsule by mouth daily.    . nitroGLYCERIN (NITROSTAT) 0.4 MG SL tablet DISSOLVE 1 TABLET UNDER THE TONGUE AS DIRECTED AND AS NEEDED FOR CHEST PAIN 25 tablet 4  . OXYGEN Inhale 2 L continuous into the lungs.     . polyethylene glycol (MIRALAX / GLYCOLAX) packet Take 17 g by mouth daily as needed for mild constipation.    . predniSONE (DELTASONE) 10 MG tablet Take 10 mg by mouth daily with breakfast.    .  ranitidine (ZANTAC) 150 MG tablet Take 150 mg by mouth 2 (two) times daily.    . Sodium Chloride, Hypertonic, (MURO 128 OP) Place 1 drop 4 (four) times daily into both eyes.    Marland Kitchen SPIRIVA RESPIMAT 2.5 MCG/ACT AERS INHALE 2 PUFFS BY MOUTH EVERY DAY 4 g 11  . SYMBICORT 160-4.5 MCG/ACT inhaler INHALE 2 PUFFS BY MOUTH EVERY MORNING AND THEN ANOTHER 2 PUFFS ABOUT 12 HOURS LATER 1 Inhaler 6  . TOPROL XL 25 MG 24 hr tablet TAKE 1 TABLET BY MOUTH DAILY (Patient taking differently: TAKE 1 TABLET (25mg ) BY MOUTH DAILY) 30 tablet 11  . XOPENEX HFA 45 MCG/ACT inhaler INHALE 2 PUFFS BY MOUTH EVERY 4 HOURS AS NEEDED FOR WHEEZING OR SHORTNESS OF BREATH 15 g 11  . ALPRAZolam (XANAX) 0.25 MG tablet Take 1 tablet (0.25 mg total) by mouth 3 (three) times daily. Takes 1/2 tablet two times daily. (Patient not taking:  Reported on 03/21/2017) 90 tablet 2  . b complex vitamins tablet Take 1 tablet by mouth daily.    . polyethylene glycol (MIRALAX / GLYCOLAX) packet Take 17 g by mouth daily as needed. (Patient not taking: Reported on 03/21/2017) 14 each 0   No facility-administered medications prior to visit.      Allergies:   Sulfonamide derivatives; Cefdinir; Ciprofloxacin; Nitrofurantoin; and Levaquin [levofloxacin]   Social History   Socioeconomic History  . Marital status: Married    Spouse name: None  . Number of children: 2  . Years of education: None  . Highest education level: None  Social Needs  . Financial resource strain: None  . Food insecurity - worry: None  . Food insecurity - inability: None  . Transportation needs - medical: None  . Transportation needs - non-medical: None  Occupational History  . 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 Smoking status: Former Smoker    Packs/day: 2.00    Years: 33.00    Pack years: 66.00    Types: Cigarettes    Last attempt to quit: 01/09/1982    Years since quitting: 35.2  . Smokeless tobacco: Never Used  Substance and Sexual Activity  . Alcohol use: Yes    Alcohol/week: 0.0 oz    Comment: 1.5-3 oz daily in the past, minimal use in the last few months  . Drug use: No  . Sexual activity: None  Other Topics Concern  . None  Social History Narrative   Pt lives at home with his spouse.           Family History:  The patient's family history includes Allergies in his brother; Breast cancer in his mother; Cancer in his mother; Heart attack in his father; Heart disease in his brother, brother, father, and mother; Hyperlipidemia in his brother; Hypertension in his brother and mother; Other in his mother; Peripheral vascular disease in his brother; Stroke in his brother.   ROS:   Please see the history of present illness.    Multisystem positivity: Chest pain, leg swelling, vision disturbance,  shortness of breath, abdominal pain, depression, muscle pain, dizziness, easy bruising, difficulty with balance and walking, anxiety, irregular and skipped heartbeats but no documentation of atrial fibrillation, and excessive fatigue.  Chronic oxygen therapy. All other systems reviewed and are negative.   PHYSICAL EXAM:   VS:  BP 118/68   Pulse 92   Ht 5\' 9"  (1.753 m)   Wt 133 lb 1.9 oz (60.4 kg)   BMI 19.66  kg/m    GEN: Well nourished, well developed, in no acute distress.  Frail appearing.  Oxygen cannula in place. HEENT: normal  Neck: no JVD, carotid bruits, or masses Cardiac: RRR; no murmurs, rubs, or gallops,no edema  Respiratory:  clear to auscultation bilaterally, normal work of breathing GI: soft, nontender, nondistended, + BS MS: no deformity or atrophy  Skin: warm and dry, no rash Neuro:  Alert and Oriented x 3, Strength and sensation are intact Psych: euthymic mood, full affect  Orthostatic blood pressure 135/70 sitting decrease in the 120/68 mmHg.  Heart rate rose from 92 bpm to 105 bpm.  Wt Readings from Last 3 Encounters:  03/21/17 133 lb 1.9 oz (60.4 kg)  03/12/17 126 lb 9.6 oz (57.4 kg)  03/08/17 138 lb 1.9 oz (62.7 kg)      Studies/Labs Reviewed:   EKG:  EKG  None  Recent Labs: 02/05/2017: ALT 19 03/08/2017: B Natriuretic Peptide 237.5; TSH 1.851 03/09/2017: Magnesium 2.2 03/12/2017: BUN 24; Creatinine, Ser 1.18; Hemoglobin 12.1; Platelets 184; Potassium 4.0; Sodium 139   Lipid Panel    Component Value Date/Time   CHOL 173 03/09/2017 0139   TRIG 32 03/09/2017 0139   HDL 81 03/09/2017 0139   CHOLHDL 2.1 03/09/2017 0139   VLDL 6 03/09/2017 0139   LDLCALC 86 03/09/2017 0139    Additional studies/ records that were reviewed today include:  2D Doppler echocardiogram 03/09/2017:  Study Conclusions   - Left ventricle: The cavity size was normal. Systolic function was   normal. The estimated ejection fraction was in the range of 60%   to 65%. Wall motion  was normal; there were no regional wall   motion abnormalities. Doppler parameters are consistent with   abnormal left ventricular relaxation (grade 1 diastolic   dysfunction). - Aortic valve: Transvalvular velocity was within the normal range.   There was no stenosis. There was no regurgitation. - Mitral valve: Transvalvular velocity was within the normal range.   There was no evidence for stenosis. There was trivial   regurgitation. - Right ventricle: The cavity size was normal. Wall thickness was   normal. Systolic function was reduced. - Tricuspid valve: There was mild regurgitation. - Pulmonary arteries: Systolic pressure was within the normal   range. PA peak pressure: 24 mm Hg (S). - Pericardium, extracardiac: A trivial pericardial effusion was   identified.   ASSESSMENT:    1. Coronary artery disease involving coronary bypass graft of native heart with angina pectoris ([place])   2. Cor pulmonale, chronic (HCC)/ clinical dx    3. Acute on chronic diastolic heart failure ([place])   4. Chronic respiratory failure with hypoxia and hypercapnia (HCC)   5. Anxiety   6. Elevated troponin      PLAN:  In order of problems listed above:  1. The patient did not have a significant infarction during his recent hospital stay.  This was discussed with the patient.  His EF is greater than 60%.  He had enzyme leak due to stress. 2. This is his major cardiovascular/medical comorbidity. 3. Acute on chronic diastolic heart failure contributed to the recent admission for respiratory failure.  There was improvement in breathing with diuresis.  We will check a basic metabolic panel today to determine renal function and ascertain whether over diuresis is occurring.  For the time being we should continue furosemide as recommended. 4. Appropriate problem.  This is been an issue over time. 5. Not related to myocardial infarction but rather due to demand  ischemia in the setting of underlying coronary  disease and stress of hypoxia and hypercapnia when admitted.    Medication Adjustments/Labs and Tests Ordered: Current medicines are reviewed at length with the patient today.  Concerns regarding medicines are outlined above.  Medication changes, Labs and Tests ordered today are listed in the Patient Instructions below. Patient Instructions  Medication Instructions:  Your physician recommends that you continue on your current medications as directed. Please refer to the Current Medication list given to you today.  Labwork: BMET and Pro BNP today  Testing/Procedures: None  Follow-Up: Your physician recommends that you schedule a follow-up appointment in: 6 weeks with PA or NP.  Your physician wants you to follow-up in: 6 months with Dr. Tamala Julian.  You will receive a reminder letter in the mail two months in advance. If you don't receive a letter, please call our office to schedule the follow-up appointment.    Any Other Special Instructions Will Be Listed Below (If Applicable).     If you need a refill on your cardiac medications before your next appointment, please call your pharmacy.      Signed, Sinclair Grooms, MD  03/21/2017 2:44 PM    [facility] [address], [place], [place]  [address] Phone: [phone]; Fax: [phone]

## 2017-03-20 NOTE — Patient Outreach (Signed)
[facility] [facility]) Care Management  03/20/2017  [name] Nov 25, 1929 [id number]    Transition of care  (2nd outreach Unsuccessful)  RN attempted outreach call today however not pt not available but RN able to leave a HIPAA approved voice message requesting a call back for ongoing services. Will send outreach letter and follow up in 10 days for a response.  [name], RN Care Management Coordinator [place] Office [phone]

## 2017-03-21 ENCOUNTER — Encounter: Payer: Self-pay | Admitting: Interventional Cardiology

## 2017-03-21 ENCOUNTER — Ambulatory Visit (INDEPENDENT_AMBULATORY_CARE_PROVIDER_SITE_OTHER): Payer: Medicare Other | Admitting: Interventional Cardiology

## 2017-03-21 VITALS — BP 118/68 | HR 92 | Ht 69.0 in | Wt 133.1 lb

## 2017-03-21 DIAGNOSIS — I779 Disorder of arteries and arterioles, unspecified: Secondary | ICD-10-CM

## 2017-03-21 DIAGNOSIS — F419 Anxiety disorder, unspecified: Secondary | ICD-10-CM | POA: Diagnosis not present

## 2017-03-21 DIAGNOSIS — I25709 Atherosclerosis of coronary artery bypass graft(s), unspecified, with unspecified angina pectoris: Secondary | ICD-10-CM | POA: Diagnosis not present

## 2017-03-21 DIAGNOSIS — I2781 Cor pulmonale (chronic): Secondary | ICD-10-CM

## 2017-03-21 DIAGNOSIS — I5033 Acute on chronic diastolic (congestive) heart failure: Secondary | ICD-10-CM | POA: Diagnosis not present

## 2017-03-21 DIAGNOSIS — R748 Abnormal levels of other serum enzymes: Secondary | ICD-10-CM

## 2017-03-21 DIAGNOSIS — R7989 Other specified abnormal findings of blood chemistry: Secondary | ICD-10-CM

## 2017-03-21 DIAGNOSIS — R778 Other specified abnormalities of plasma proteins: Secondary | ICD-10-CM

## 2017-03-21 NOTE — Patient Instructions (Signed)
Medication Instructions:  Your physician recommends that you continue on your current medications as directed. Please refer to the Current Medication list given to you today.  Labwork: BMET and Pro BNP today  Testing/Procedures: None  Follow-Up: Your physician recommends that you schedule a follow-up appointment in: 6 weeks with PA or NP.  Your physician wants you to follow-up in: 6 months with Dr. Tamala Julian.  You will receive a reminder letter in the mail two months in advance. If you don't receive a letter, please call our office to schedule the follow-up appointment.    Any Other Special Instructions Will Be Listed Below (If Applicable).     If you need a refill on your cardiac medications before your next appointment, please call your pharmacy.

## 2017-03-22 ENCOUNTER — Telehealth: Payer: Self-pay | Admitting: *Deleted

## 2017-03-22 ENCOUNTER — Ambulatory Visit (INDEPENDENT_AMBULATORY_CARE_PROVIDER_SITE_OTHER): Payer: Medicare Other | Admitting: Internal Medicine

## 2017-03-22 ENCOUNTER — Encounter: Payer: Self-pay | Admitting: Internal Medicine

## 2017-03-22 VITALS — BP 106/60 | HR 88 | Ht 69.0 in | Wt 132.8 lb

## 2017-03-22 DIAGNOSIS — J9611 Chronic respiratory failure with hypoxia: Secondary | ICD-10-CM | POA: Diagnosis not present

## 2017-03-22 DIAGNOSIS — J9612 Chronic respiratory failure with hypercapnia: Secondary | ICD-10-CM

## 2017-03-22 DIAGNOSIS — J310 Chronic rhinitis: Secondary | ICD-10-CM | POA: Diagnosis not present

## 2017-03-22 DIAGNOSIS — J431 Panlobular emphysema: Secondary | ICD-10-CM | POA: Diagnosis not present

## 2017-03-22 DIAGNOSIS — I779 Disorder of arteries and arterioles, unspecified: Secondary | ICD-10-CM | POA: Diagnosis not present

## 2017-03-22 LAB — BASIC METABOLIC PANEL
BUN/Creatinine Ratio: 15 (ref 10–24)
BUN: 16 mg/dL (ref 8–27)
CO2: 31 mmol/L — ABNORMAL HIGH (ref 20–29)
Calcium: 9.4 mg/dL (ref 8.6–10.2)
Chloride: 96 mmol/L (ref 96–106)
Creatinine, Ser: 1.04 mg/dL (ref 0.76–1.27)
GFR calc Af Amer: 75 mL/min/{1.73_m2} (ref >59)
GFR calc non Af Amer: 65 mL/min/{1.73_m2} (ref >59)
Glucose: 100 mg/dL — ABNORMAL HIGH (ref 65–99)
Potassium: 4.1 mmol/L (ref 3.5–5.2)
Sodium: 141 mmol/L (ref 134–144)

## 2017-03-22 LAB — PRO B NATRIURETIC PEPTIDE: NT-Pro BNP: 1372 pg/mL — ABNORMAL HIGH (ref 0–486)

## 2017-03-22 NOTE — Progress Notes (Signed)
Subjective:    Patient ID: [name], male    DOB: 08-16-1929   MRN: [id number]     Summary: Pulmonary/ f/u ov try astepro for pnds   Primary Provider/Referring Provider: [name]    Brief patient profile:  15  yowm last smoked 1983 with GOLD III COPD with some reversibility and 02 dep at hs and with activity    History of Present Illness   10/19/2014  f/u ov/[name] re: GOLD III / maint rx symbicort/ spiriva Chief Complaint  Patient presents with  . Follow-up    Pt c/o fatigue and increased chest congestion for the past several wks. He has am cough with white sputum. He is using xopenex 2 x per day on average.    on 10/8 started pred 20 and zpak as per med calendar action plan  gxt flat x 79mph x 15 min and zero grade on 2lpm with sats mid 90s at home  >decrease pred 5mg           05/21/15 Eval by Dr Dedio/ rec trial of NIV         Admit date: 09/24/2015 Discharge date: 09/26/2015 Primary Cardiologist:Dr. Tamala Julian   Discharge Diagnoses:  Principal Problem:   Angina at rest [facility]) Active Problems:   Essential hypertension   COPD GOLD IV/ 02 dep /steroid dep   Chronic respiratory failure with hypoxia and hypercapnia (HCC)   CAD (coronary artery disease)   Chest pain   Orthostatic hypotension not corrected on prednisone 20 mg daily          02/20/2017 acute extended ov/[name] re: transition of care/ re-establish re copd/ chronic resp failure/cor pulmonale Chief Complaint  Patient presents with  . Acute Visit    swelling in his legs has been worse since his last hospital stay.    doe = MMRC4  = sob if tries to leave home or while getting dressed   Congested rattling cough not using flutter valve much//mucus dark > zpak turning white  xopenex use up so double prednisone no better sob so now on 30 mg daily vs floor of pred 10 g daily  Says sleeps better on trelegy but still wakes up feeling lousy  rec Always chase the afrin with either nasonex or flonase  Change  furosemide to where you  20 mg one daily automatically and then one extra as needed for leg swelling   Add aldactone (spironolactone) 50 mg one daily as per med calendar Plan A = Automatic = symbicort 160 Take 2 puffs first thing in am and then another 2 puffs about 12 hours later and spiriva 2 pffs each am  Plan B = Backup Only use your levoalbuterol as a rescue medication  Plan C = Crisis - only use your levoalbuterol nebulizer if you first try Plan B  Plan D = Deltasone 10 mg double it until better then one daily thereafter  Plan E = ER - go to ER or call 911 if all else fails      03/22/2017  f/u ov/[name] re:  Chief Complaint  Patient presents with  . Follow-up    admitted to [facility] 03/08/17-03/12/17 with heart failure. He states that he feels exhausted all the time and he is having alot of chest congestion.    Dyspnea:  MMRC4  = sob if tries to leave home or while getting dressed   Cough: rattling/ no purulent sputum assoc with lots of nasal congestion does improve with afrin  Sleep: on NIV ok  SABA use:  Doesn't seem to help much    No obvious day to day or daytime variability or assoc excess/ purulent sputum or mucus plugs or hemoptysis or cp or chest tightness, subjective wheeze or overt sinus or hb symptoms. No unusual exposure hx or h/o childhood pna/ asthma or knowledge of premature birth.  Sleeping ok flat without nocturnal  or early am exacerbation  of respiratory  c/o's or need for noct saba. Also denies any obvious fluctuation of symptoms with weather or environmental changes or other aggravating or alleviating factors except as outlined above   Current Allergies, Complete Past Medical History, Past Surgical History, Family History, and Social History were reviewed in [employer] record.  ROS  The following are not active complaints unless bolded Hoarseness, sore throat, dysphagia, dental problems, itching, sneezing,  nasal congestion or discharge of  excess mucus or purulent secretions, ear ache,   fever, chills, sweats, unintended wt loss or wt gain, classically pleuritic or exertional cp,  orthopnea pnd or leg swelling, presyncope, palpitations, abdominal pain, anorexia, nausea, vomiting, diarrhea  or change in bowel habits or change in bladder habits, change in stools or change in urine, dysuria, hematuria,  rash, arthralgias, visual complaints, headache, numbness, weakness or ataxia or problems with walking or coordination,  change in mood/affect or memory.        Current Meds  Medication Sig  . ALPRAZolam (XANAX) 0.25 MG tablet Take half (1/2) tablet by mouth three (3) times daily.  Marland Kitchen aspirin 81 MG tablet Take 81 mg by mouth daily.  Marland Kitchen atorvastatin (LIPITOR) 20 MG tablet TAKE 1 TABLET BY MOUTH EVERY DAY (Patient taking differently: TAKE 1 TABLET (20mg ) BY MOUTH EVERY DAY)  . Cholecalciferol (VITAMIN D3) 2000 units TABS Take 2,000 Units by mouth daily.  . Cyanocobalamin (VITAMIN B 12 PO) Take 1 tablet daily by mouth.   . finasteride (PROSCAR) 5 MG tablet Take 5 mg by mouth every morning.  . furosemide (LASIX) 20 MG tablet Take 1 tablet (20 mg total) by mouth daily. Can take one additional pill if needed for swelling.  Marland Kitchen guaiFENesin (MUCINEX) 600 MG 12 hr tablet Take 600 mg by mouth 2 (two) times daily as needed for cough.   . mometasone (NASONEX) 50 MCG/ACT nasal spray SHAKE LIQUID AND USE 2 SPRAYS IN EACH NOSTRIL DAILY  . Multiple Vitamins-Minerals (PRESERVISION AREDS 2) CAPS Take 1 capsule by mouth daily.  . nitroGLYCERIN (NITROSTAT) 0.4 MG SL tablet DISSOLVE 1 TABLET UNDER THE TONGUE AS DIRECTED AND AS NEEDED FOR CHEST PAIN  . OXYGEN Inhale 2 L continuous into the lungs.   . polyethylene glycol (MIRALAX / GLYCOLAX) packet Take 17 g by mouth daily as needed for mild constipation.  . predniSONE (DELTASONE) 10 MG tablet Take 10 mg by mouth daily with breakfast.  . ranitidine (ZANTAC) 150 MG tablet Take 150 mg by mouth 2 (two) times daily.   . Sodium Chloride, Hypertonic, (MURO 128 OP) Place 1 drop 4 (four) times daily into both eyes.  Marland Kitchen SPIRIVA RESPIMAT 2.5 MCG/ACT AERS INHALE 2 PUFFS BY MOUTH EVERY DAY  . SYMBICORT 160-4.5 MCG/ACT inhaler INHALE 2 PUFFS BY MOUTH EVERY MORNING AND THEN ANOTHER 2 PUFFS ABOUT 12 HOURS LATER  . TOPROL XL 25 MG 24 hr tablet TAKE 1 TABLET BY MOUTH DAILY (Patient taking differently: TAKE 1 TABLET (25mg ) BY MOUTH DAILY)  . XOPENEX HFA 45 MCG/ACT inhaler INHALE 2 PUFFS BY MOUTH EVERY 4 HOURS AS NEEDED FOR WHEEZING OR SHORTNESS  OF BREATH                        Past Medical History:  CHRONIC RHINITIS (ICD-472.0)  - Sinus ct March 03, 2010 > thickening only, no acute changes  CHRONIC RESPIRATORY FAILURE (ICD-518.83)  - 02 rx since 2008  COPD UNSPECIFIED (ICD-496)....................................................................[name]  - PFTs 05/04/05 FEV1 36% ratio 28% with 29% improvement after bronchodilators DLCO 48%  - PFT's 03/26/08 30% ratio 34 with 14% improvement after bronchodilators DLC0 38 %  - Start noct 02 at 2lpm 2008 and on 03/04/09 desat @ > 185 ft so rec wear with activtiy > rm to rm  - [facility] June 30, 2008 100% > confirmed March 04, 2009  ISCHEMIC HEART DISEASE (ICD-414.9)  HYPERTENSION (ICD-401.9)  HYPERLIPIDEMIA (ICD-272.4)  SPN RUL by cxr August 19, 2009  COMPLEX MED REGIMEN-Meds reviewed with pt education and computerized med calendar 11/22/09 03/10/2011  , 10/15/2012  07/17/2013 , 12/09/2013 , 06/15/2014 , 11/19/14 , 5.11.17          Objective:   Physical Exam    wt 159 04/28/2010  > 10/14/2010 154  > 10/04/2011 150> 12/21/2011  150 > 149 01/29/2012 >154 04/05/2012 > 05/22/2012 148 > 09/30/2012  146 >>144> 10/15/2012 > 12/18/2012  145 > 148 02/14/2013 >148 03/14/2013 > 04/25/2013 148 > 06/19/2013 150 >>07/17/2013 > 08/27/2013 148 > 10/23/2013  148 >153 12/09/2013 > 02/26/2014  153 > 06/01/2014  152 > 157 06/15/2014 > 09/21/2014 146 > 10/19/2014 151 >>148 11/19/14 > 02/24/2015 148 > 06/30/2015     140 >  10/04/2015  136 > 12/20/2015   142 > 06/13/2016  132 > 09/13/2016   133 >  11/07/2016  139 > 01/11/2017   135 > 02/20/2017  135 >  03/22/2017  132    Vital signs reviewed - Note on arrival 02 sats  92% on 2lpm pulsed     amb thin anxious wm nad    HEENT: nl dentition, turbinates bilaterally, and oropharynx. Nl external ear canals without cough reflex   NECK :  without JVD/Nodes/TM/ nl carotid upstrokes bilaterally   LUNGS: no acc muscle use,  barrel contour chest wall with bilateral  Distant bs w/o wheeze and  without cough on insp or exp maneuver and   Hyperresonant  to  percussion bilaterally    CV:  RRR  no s3 or murmur or increase in P2, and  1+ pitting both ankles    ABD:  soft and nontender with nl inspiratory excursion in the supine position. No bruits or organomegaly appreciated, bowel sounds nl  MS:  Nl gait/ ext warm without deformities, calf tenderness, cyanosis or clubbing No obvious joint restrictions   SKIN: warm and dry without lesions    NEURO:  alert, approp, nl sensorium with  no motor or cerebellar deficits apparent.         CXR PA and Lateral:   02/20/2017 :    I personally reviewed images and agree with radiology impression as follows:    No acute abnormality.

## 2017-03-22 NOTE — Patient Instructions (Signed)
Afrin x 3 days each side then switch sides always chase with nasonex  See Tammy NP w/in 4 weeks with all your medications, even over the counter meds, separated in two separate bags, the ones you take no matter what vs the ones you stop once you feel better and take only as needed when you feel you need them.   Tammy  will generate for you a new user friendly medication calendar that will put [place] all on the same page re: your medication use.     Without this process, it simply isn't possible to assure that we are providing  your outpatient care  with  the attention to detail we feel you deserve.   If we cannot assure that you're getting that kind of care,  then we cannot manage your problem effectively from this clinic.  Once you have seen Tammy and we are sure that we're all on the same page with your medication use she will arrange follow up with me.

## 2017-03-23 DIAGNOSIS — J441 Chronic obstructive pulmonary disease with (acute) exacerbation: Secondary | ICD-10-CM | POA: Diagnosis not present

## 2017-03-23 DIAGNOSIS — J09X2 Influenza due to identified novel influenza A virus with other respiratory manifestations: Secondary | ICD-10-CM | POA: Diagnosis not present

## 2017-03-23 DIAGNOSIS — I2510 Atherosclerotic heart disease of native coronary artery without angina pectoris: Secondary | ICD-10-CM | POA: Diagnosis not present

## 2017-03-23 DIAGNOSIS — J9612 Chronic respiratory failure with hypercapnia: Secondary | ICD-10-CM | POA: Diagnosis not present

## 2017-03-23 DIAGNOSIS — I10 Essential (primary) hypertension: Secondary | ICD-10-CM | POA: Diagnosis not present

## 2017-03-23 DIAGNOSIS — J9611 Chronic respiratory failure with hypoxia: Secondary | ICD-10-CM | POA: Diagnosis not present

## 2017-03-24 ENCOUNTER — Observation Stay (HOSPITAL_COMMUNITY)
Admission: EM | Admit: 2017-03-24 | Discharge: 2017-03-26 | Disposition: A | Discharge status: Home / Self Care | Payer: Medicare Other | Attending: Internal Medicine | Admitting: Internal Medicine

## 2017-03-24 ENCOUNTER — Encounter (HOSPITAL_COMMUNITY): Payer: Self-pay | Admitting: Family Medicine

## 2017-03-24 ENCOUNTER — Emergency Department (HOSPITAL_COMMUNITY): Payer: Medicare Other

## 2017-03-24 DIAGNOSIS — I5042 Chronic combined systolic (congestive) and diastolic (congestive) heart failure: Secondary | ICD-10-CM | POA: Diagnosis not present

## 2017-03-24 DIAGNOSIS — I10 Essential (primary) hypertension: Secondary | ICD-10-CM | POA: Diagnosis not present

## 2017-03-24 DIAGNOSIS — I5032 Chronic diastolic (congestive) heart failure: Secondary | ICD-10-CM | POA: Diagnosis present

## 2017-03-24 DIAGNOSIS — I2583 Coronary atherosclerosis due to lipid rich plaque: Secondary | ICD-10-CM | POA: Diagnosis not present

## 2017-03-24 DIAGNOSIS — I441 Atrioventricular block, second degree: Secondary | ICD-10-CM | POA: Diagnosis not present

## 2017-03-24 DIAGNOSIS — I200 Unstable angina: Secondary | ICD-10-CM | POA: Diagnosis not present

## 2017-03-24 DIAGNOSIS — J449 Chronic obstructive pulmonary disease, unspecified: Secondary | ICD-10-CM | POA: Diagnosis not present

## 2017-03-24 DIAGNOSIS — Z7982 Long term (current) use of aspirin: Secondary | ICD-10-CM | POA: Diagnosis not present

## 2017-03-24 DIAGNOSIS — I2510 Atherosclerotic heart disease of native coronary artery without angina pectoris: Secondary | ICD-10-CM | POA: Diagnosis not present

## 2017-03-24 DIAGNOSIS — R079 Chest pain, unspecified: Principal | ICD-10-CM | POA: Diagnosis present

## 2017-03-24 DIAGNOSIS — J9611 Chronic respiratory failure with hypoxia: Secondary | ICD-10-CM | POA: Diagnosis present

## 2017-03-24 DIAGNOSIS — Z87891 Personal history of nicotine dependence: Secondary | ICD-10-CM | POA: Insufficient documentation

## 2017-03-24 DIAGNOSIS — I110 Hypertensive heart disease with heart failure: Secondary | ICD-10-CM | POA: Diagnosis not present

## 2017-03-24 DIAGNOSIS — R0602 Shortness of breath: Secondary | ICD-10-CM | POA: Diagnosis not present

## 2017-03-24 DIAGNOSIS — R000 Tachycardia, unspecified: Secondary | ICD-10-CM | POA: Diagnosis not present

## 2017-03-24 LAB — CBC
HCT: 42.1 % (ref 39.0–52.0)
Hemoglobin: 13.5 g/dL (ref 13.0–17.0)
MCH: 31.8 pg (ref 26.0–34.0)
MCHC: 32.1 g/dL (ref 30.0–36.0)
MCV: 99.3 fL (ref 78.0–100.0)
Platelets: 261 10*3/uL (ref 150–400)
RBC: 4.24 MIL/uL (ref 4.22–5.81)
RDW: 13.1 % (ref 11.5–15.5)
WBC: 10.5 10*3/uL (ref 4.0–10.5)

## 2017-03-24 LAB — BASIC METABOLIC PANEL
Anion gap: 13 (ref 5–15)
BUN: 18 mg/dL (ref 6–20)
CO2: 28 mmol/L (ref 22–32)
Calcium: 9.3 mg/dL (ref 8.9–10.3)
Chloride: 92 mmol/L — ABNORMAL LOW (ref 101–111)
Creatinine, Ser: 0.99 mg/dL (ref 0.61–1.24)
GFR calc Af Amer: >60 mL/min (ref >60)
GFR calc non Af Amer: >60 mL/min (ref >60)
Glucose, Bld: 161 mg/dL — ABNORMAL HIGH (ref 65–99)
Potassium: 4.2 mmol/L (ref 3.5–5.1)
Sodium: 133 mmol/L — ABNORMAL LOW (ref 135–145)

## 2017-03-24 LAB — I-STAT TROPONIN, ED: Troponin i, poc: 0.03 ng/mL (ref 0.00–0.08)

## 2017-03-24 LAB — BRAIN NATRIURETIC PEPTIDE: B Natriuretic Peptide: 190.3 pg/mL — ABNORMAL HIGH (ref 0.0–100.0)

## 2017-03-24 MED ORDER — NITROGLYCERIN 2 % TD OINT
0.5000 [in_us] | TOPICAL_OINTMENT | Freq: Once | TRANSDERMAL | Status: AC
  Administered 2017-03-24: 0.5 [in_us] via TOPICAL
  Filled 2017-03-24: qty 1

## 2017-03-24 MED ORDER — ZOLPIDEM TARTRATE 5 MG PO TABS: 5.0000 mg | ORAL_TABLET | Freq: Every evening | ORAL | Status: DC | PRN

## 2017-03-24 MED ORDER — INSULIN ASPART 100 UNIT/ML ~~LOC~~ SOLN: 0.0000 [IU] | Freq: Three times a day (TID) | SUBCUTANEOUS | Status: DC

## 2017-03-24 MED ORDER — PREDNISONE 10 MG PO TABS
10.0000 mg | ORAL_TABLET | Freq: Every day | ORAL | Status: DC
  Administered 2017-03-25 – 2017-03-26 (×2): 10 mg via ORAL
  Filled 2017-03-24 (×2): qty 1

## 2017-03-24 MED ORDER — NITROGLYCERIN 0.4 MG SL SUBL
0.4000 mg | SUBLINGUAL_TABLET | SUBLINGUAL | Status: DC | PRN
  Administered 2017-03-24 (×2): 0.4 mg via SUBLINGUAL
  Filled 2017-03-24: qty 1

## 2017-03-24 MED ORDER — FAMOTIDINE 20 MG PO TABS
20.0000 mg | ORAL_TABLET | Freq: Two times a day (BID) | ORAL | Status: DC
  Administered 2017-03-25 – 2017-03-26 (×4): 20 mg via ORAL
  Filled 2017-03-24 (×4): qty 1

## 2017-03-24 MED ORDER — ONDANSETRON HCL 4 MG/2ML IJ SOLN: 4.0000 mg | Freq: Four times a day (QID) | INTRAMUSCULAR | Status: DC | PRN

## 2017-03-24 MED ORDER — POLYETHYLENE GLYCOL 3350 17 G PO PACK: 17.0000 g | PACK | Freq: Two times a day (BID) | ORAL | Status: DC | PRN

## 2017-03-24 MED ORDER — ALBUTEROL SULFATE (2.5 MG/3ML) 0.083% IN NEBU: 2.5000 mg | INHALATION_SOLUTION | RESPIRATORY_TRACT | Status: DC | PRN

## 2017-03-24 MED ORDER — FUROSEMIDE 20 MG PO TABS
20.0000 mg | ORAL_TABLET | Freq: Every day | ORAL | Status: DC
Start: 2017-03-25 — End: 2017-03-26
  Administered 2017-03-25 – 2017-03-26 (×2): 20 mg via ORAL
  Filled 2017-03-24 (×2): qty 1

## 2017-03-24 MED ORDER — ACETAMINOPHEN 325 MG PO TABS: 650.0000 mg | ORAL_TABLET | ORAL | Status: DC | PRN

## 2017-03-24 MED ORDER — MOMETASONE FURO-FORMOTEROL FUM 200-5 MCG/ACT IN AERO
2.0000 | INHALATION_SPRAY | Freq: Two times a day (BID) | RESPIRATORY_TRACT | Status: DC
  Filled 2017-03-24: qty 8.8

## 2017-03-24 MED ORDER — ASPIRIN EC 81 MG PO TBEC
81.0000 mg | DELAYED_RELEASE_TABLET | Freq: Every day | ORAL | Status: DC
  Administered 2017-03-25 – 2017-03-26 (×2): 81 mg via ORAL
  Filled 2017-03-24 (×2): qty 1

## 2017-03-24 MED ORDER — ALPRAZOLAM 0.25 MG PO TABS: 0.1250 mg | ORAL_TABLET | ORAL | Status: DC

## 2017-03-24 MED ORDER — TIOTROPIUM BROMIDE MONOHYDRATE 18 MCG IN CAPS
18.0000 ug | ORAL_CAPSULE | Freq: Every day | RESPIRATORY_TRACT | Status: DC
  Filled 2017-03-24: qty 5

## 2017-03-24 MED ORDER — FINASTERIDE 5 MG PO TABS
5.0000 mg | ORAL_TABLET | Freq: Every day | ORAL | Status: DC
  Administered 2017-03-25 – 2017-03-26 (×2): 5 mg via ORAL
  Filled 2017-03-24 (×3): qty 1

## 2017-03-24 MED ORDER — SODIUM CHLORIDE (HYPERTONIC) 2 % OP SOLN: 1.0000 [drp] | OPHTHALMIC | Status: DC | PRN

## 2017-03-24 MED ORDER — B COMPLEX-C PO TABS
1.0000 | ORAL_TABLET | Freq: Every day | ORAL | Status: DC
  Administered 2017-03-25 – 2017-03-26 (×2): 1 via ORAL
  Filled 2017-03-24 (×3): qty 1

## 2017-03-24 MED ORDER — NITROGLYCERIN 2 % TD OINT: 0.5000 [in_us] | TOPICAL_OINTMENT | Freq: Four times a day (QID) | TRANSDERMAL | Status: DC

## 2017-03-24 MED ORDER — ENOXAPARIN SODIUM 40 MG/0.4ML ~~LOC~~ SOLN: 40.0000 mg | SUBCUTANEOUS | Status: DC

## 2017-03-24 MED ORDER — FLUTICASONE PROPIONATE 50 MCG/ACT NA SUSP
2.0000 | Freq: Every day | NASAL | Status: DC
  Filled 2017-03-24: qty 16

## 2017-03-24 MED ORDER — ATORVASTATIN CALCIUM 20 MG PO TABS
20.0000 mg | ORAL_TABLET | Freq: Every day | ORAL | Status: DC
  Administered 2017-03-25: 20 mg via ORAL
  Filled 2017-03-24 (×2): qty 1

## 2017-03-24 MED ORDER — METOPROLOL SUCCINATE ER 25 MG PO TB24
25.0000 mg | ORAL_TABLET | Freq: Every day | ORAL | Status: DC
  Administered 2017-03-25 – 2017-03-26 (×2): 25 mg via ORAL
  Filled 2017-03-24 (×3): qty 1

## 2017-03-24 MED ORDER — PANTOPRAZOLE SODIUM 40 MG PO TBEC
40.0000 mg | DELAYED_RELEASE_TABLET | Freq: Every day | ORAL | Status: DC
  Filled 2017-03-24 (×2): qty 1

## 2017-03-24 NOTE — ED Triage Notes (Signed)
Patient presents to ED for assessment of generalized chest pain starting 1-2 hours ago, with SOB, light-headedness, dizziness.  Pt is on 2L  [place] at home for COPD, states he increased it to 2.5, 97% at triage

## 2017-03-24 NOTE — ED Provider Notes (Signed)
[place] EMERGENCY DEPARTMENT Provider Note   CSN: [id number] Arrival date & time: 03/24/17  2013     History   Chief Complaint Chief Complaint  Patient presents with  . Chest Pain    HPI [name] is a 84 y.o. male.  The history is provided by the patient.  Chest Pain   This is a recurrent problem. The current episode started more than 1 week ago. The problem occurs every several days. The problem has been gradually worsening. The pain is present in the substernal region. The pain is at a severity of 8/10. The pain is severe. The quality of the pain is described as heavy. The pain does not radiate. Duration of episode(s) is 7 hours. Associated symptoms include exertional chest pressure, nausea and shortness of breath. Pertinent negatives include no abdominal pain, no back pain, no cough, no diaphoresis, no fever, no headaches, no leg pain, no lower extremity edema, no palpitations, no syncope and no vomiting. He has tried nitroglycerin (aspirin) for the symptoms. The treatment provided mild relief.  His past medical history is significant for CAD, COPD and hypertension.    Past Medical History:  Diagnosis Date  . CAD (coronary artery disease)   . COPD (chronic obstructive pulmonary disease) ([place])   . HTN (hypertension)     Patient Active Problem List   Diagnosis Date Noted  . Chest pain 03/24/2017  . AV block, Mobitz 1 03/24/2017  . CAD (coronary artery disease) 03/24/2017  . Chronic diastolic CHF (congestive heart failure) ([place]) 03/24/2017  . COPD (chronic obstructive pulmonary disease) ([place]) 03/24/2017  . Chronic respiratory failure with hypoxia ([place]) 03/24/2017    History reviewed. No pertinent surgical history.     Home Medications    Prior to Admission medications   Medication Sig Start Date End Date Taking? Authorizing Provider  ALPRAZolam (XANAX) 0.25 MG tablet Take 0.125-0.25 mg by mouth See admin instructions. Take one tablet  (0.25mg ) three times daily and take one half tablet (0.125mg ) twice daily.   Yes [provider]  aspirin EC 81 MG tablet Take 81 mg by mouth daily.   Yes [provider]  atorvastatin (LIPITOR) 20 MG tablet Take 20 mg by mouth daily at 6 PM. 03/21/17  Yes [provider]  b complex vitamins capsule Take 1 capsule by mouth daily.   Yes [provider]  Cyanocobalamin (VITAMIN B-12 PO) Take 1 tablet by mouth daily.   Yes [provider]  finasteride (PROSCAR) 5 MG tablet Take 5 mg by mouth daily. 03/05/17  Yes [provider]  furosemide (LASIX) 20 MG tablet Take 20-40 mg by mouth See admin instructions. Take one tablet (20mg ) daily, may take one additional tablet (20mg ) as needed for leg swelling 02/27/17  Yes [provider]  mometasone (NASONEX) 50 MCG/ACT nasal spray Place 2 sprays into the nose daily. 03/05/17  Yes [provider]  Multiple Vitamins-Minerals (PRESERVISION AREDS 2 PO) Take 1 capsule by mouth daily.   Yes [provider]  nitroGLYCERIN (NITROSTAT) 0.4 MG SL tablet Place 0.4 mg under the tongue as needed for chest pain. 03/02/17  Yes [provider]  OXYGEN Inhale 2 L into the lungs continuous.   Yes [provider]  polyethylene glycol (MIRALAX / GLYCOLAX) packet Take 17 g by mouth 2 (two) times daily as needed for mild constipation.   Yes [provider]  predniSONE (DELTASONE) 10 MG tablet Take 10 mg by mouth every morning. 03/21/17  Yes [provider]  ranitidine (ZANTAC) 150 MG tablet Take 150 mg by mouth 2 (two) times daily. 03/21/17  Yes [provider]  sodium chloride (MURO 128) 2 % ophthalmic solution Place 1 drop into both eyes every 4 (four) hours as needed for eye irritation.   Yes [provider]  SPIRIVA RESPIMAT 2.5 MCG/ACT AERS Inhale 2 puffs into the lungs daily. 03/05/17  Yes [provider]  SYMBICORT 160-4.5 MCG/ACT inhaler  Inhale 2 puffs into the lungs every 12 (twelve) hours. 03/05/17  Yes [provider]  TOPROL XL 25 MG 24 hr tablet Take 25 mg by mouth daily. 03/22/17  Yes [provider]  XOPENEX HFA 45 MCG/ACT inhaler Inhale 2 puffs into the lungs every 4 (four) hours as needed for shortness of breath or wheezing. 03/21/17  Yes [provider]  pantoprazole (PROTONIX) 40 MG tablet Take 40 mg by mouth daily. 02/20/17   [provider]    Family History Family History  Problem Relation Age of Onset  . Heart disease Mother   . Heart disease Father     Social History Social History   Tobacco Use  . Smoking status: Former Smoker    Packs/day: 2.00    Years: 33.00    Pack years: 66.00    Types: Cigarettes    Last attempt to quit: 03/25/1982    Years since quitting: 35.0  . Smokeless tobacco: Former Network engineer Use Topics  . Alcohol use: Not on file  . Drug use: Not on file     Allergies   Sulfa antibiotics; Cefdinir; Nitrofurantoin; Ciprofloxacin; and Levaquin [levofloxacin]   Review of Systems Review of Systems  Constitutional: Negative for chills, diaphoresis and fever.  Eyes: Negative for pain and visual disturbance.  Respiratory: Positive for shortness of breath. Negative for cough and chest tightness.   Cardiovascular: Positive for chest pain. Negative for palpitations, leg swelling and syncope.  Gastrointestinal: Positive for nausea. Negative for abdominal pain, diarrhea and vomiting.  Genitourinary: Negative for dysuria.  Musculoskeletal: Negative for back pain.  Skin: Negative for rash.  Neurological: Positive for light-headedness. Negative for syncope and headaches.  All other systems reviewed and are negative.    Physical Exam Updated Vital Signs BP 108/60   Pulse 64   Temp 98 F (36.7 C) (Oral)   Resp 12   SpO2 99%   Physical Exam  Constitutional: He appears well-developed and well-nourished. No distress.  HENT:  Head:  Normocephalic and atraumatic.  Eyes: Conjunctivae are normal.  Neck: Neck supple.  Cardiovascular: Normal rate and regular rhythm.  Pulses:      Radial pulses are 1+ on the right side, and 1+ on the left side.  Pulmonary/Chest: Effort normal and breath sounds normal. No respiratory distress. He has no decreased breath sounds. He has no wheezes. He has no rales.  Abdominal: Soft. There is no tenderness.  Musculoskeletal: He exhibits no edema.       Right lower leg: He exhibits no edema.       Left lower leg: He exhibits no edema.  Neurological: He is alert.  Skin: Skin is warm and dry.  Psychiatric: He has a normal mood and affect.  Nursing note and vitals reviewed.    ED Treatments / Results  Labs (all labs ordered are listed, but only abnormal results are displayed) Labs Reviewed  BASIC METABOLIC PANEL - Abnormal; Notable for the following components:      Result Value   Sodium  133 (*)    Chloride 92 (*)    Glucose, Bld 161 (*)    All other components within normal limits  BRAIN NATRIURETIC PEPTIDE - Abnormal; Notable for the following components:   B Natriuretic Peptide 190.3 (*)    All other components within normal limits  CBC  TROPONIN I  TROPONIN I  TROPONIN I  I-STAT TROPONIN, ED     Radiology Dg Chest 2 View  Result Date: 03/24/2017 CLINICAL DATA:  Chest pain. EXAM: CHEST - 2 VIEW COMPARISON:  Radiographs and CT 03/08/2017 FINDINGS: Post median sternotomy. Chronic hyperinflation and emphysema. Normal heart size and mediastinal contours. No pulmonary edema, pleural effusion or pneumothorax. No focal airspace disease. Degenerative change of both shoulders. IMPRESSION: 1. No acute abnormality. 2. Emphysema and chronic hyperinflation, unchanged. Electronically Signed   By: Jeb Levering M.D.   On: 03/24/2017 21:46    Procedures Procedures (including critical care time)  Medications Ordered in ED Medications  ALPRAZolam (XANAX) tablet 0.125-0.25 mg (not  administered)  aspirin EC tablet 81 mg (not administered)  atorvastatin (LIPITOR) tablet 20 mg (not administered)  b complex vitamins capsule 1 capsule (not administered)  finasteride (PROSCAR) tablet 5 mg (not administered)  furosemide (LASIX) tablet 20 mg (not administered)  fluticasone (FLONASE) 50 MCG/ACT nasal spray 2 spray (not administered)  predniSONE (DELTASONE) tablet 10 mg (not administered)  polyethylene glycol (MIRALAX / GLYCOLAX) packet 17 g (not administered)  pantoprazole (PROTONIX) EC tablet 40 mg (not administered)  famotidine (PEPCID) tablet 20 mg (not administered)  sodium chloride (MURO 128) 2 % ophthalmic solution 1 drop (not administered)  Tiotropium Bromide Monohydrate AERS 2 puff (not administered)  mometasone-formoterol (DULERA) 200-5 MCG/ACT inhaler 2 puff (2 puffs Inhalation Not Given 03/24/17 2315)  metoprolol succinate (TOPROL-XL) 24 hr tablet 25 mg (not administered)  levalbuterol (XOPENEX HFA) inhaler 2 puff (not administered)  acetaminophen (TYLENOL) tablet 650 mg (not administered)  ondansetron (ZOFRAN) injection 4 mg (not administered)  enoxaparin (LOVENOX) injection 40 mg (not administered)  nitroGLYCERIN (NITROGLYN) 2 % ointment 0.5 inch (0 inches Topical Hold 03/25/17 0003)  zolpidem (AMBIEN) tablet 5 mg (not administered)  nitroGLYCERIN (NITROGLYN) 2 % ointment 0.5 inch (0.5 inches Topical Given 03/24/17 2159)     Initial Impression / Assessment and Plan / ED Course  I have reviewed the triage vital signs and the nursing notes.  Pertinent labs & imaging results that were available during my care of the patient were reviewed by me and considered in my medical decision making (see chart for details).     Patient is a 84 year old male with history of CAD status post CABG, COPD, hypertension who presents with worsening chest pain consistent with unstable angina.  Doubt dissection, PE, pneumonia.  He has had on and off chest pain over the past couple  weeks but today it became more more frequent and then constant.  He had associated lightheadedness and nausea.  Patient took a full dose aspirin prior to arrival and took 1 nitroglycerin with mild improvement.  EKG here shows sinus tachycardia, with a second-degree AV block Mobitz 1 with a incomplete right bundle branch block.  No significant ischemic changes.  First troponin is 0.03 remainder of his labs are grossly unremarkable.  Patient given 2 more doses of nitroglycerin with moderate improvement in pain.  Patient will be admitted to hospitalist for further management of unstable angina.  Final Clinical Impressions(s) / ED Diagnoses   Final diagnoses:  Unstable angina [facility])    ED Discharge Orders  None       Tobie Poet, DO 03/25/17 0013    Carmin Muskrat, MD 03/25/17 [phone]

## 2017-03-24 NOTE — H&P (Signed)
History and Physical    [name] JKD:[id number] DOB: [birth year] DOA: 03/24/2017  PCP: [name], MD   Patient coming from: Home  Chief Complaint: Chest pain   HPI: [name] is a 84 y.o. male with medical history significant for hypertension, coronary artery disease status post remote CABG, and COPD with chronic hypoxic respiratory failure and steroid dependence, now presenting to the emergency department for evaluation of chest pain.  Patient reports that he had not experienced any chest pain remote CABG until late last month when, after suffering from COPD exacerbation secondary to influenza, he developed chest pain and had elevated troponin that was attributed to demand ischemia.  He underwent stress test on 03/12/2017 with a large infarct in the inferior wall, and also underwent echocardiogram at that time with preserved EF and normal wall motion.  He had returned to his usual state and was doing fairly well at home until developing a recurrence in his chest discomfort recently.  This resolved spontaneously, but he has had increasingly frequent and more severe recurrences now, particularly for the past 1-2 hours prior to his arrival in the ED.  Denies any increase in his chronic dyspnea, reports that his bilateral lower extremity swelling has been improving, and denies fevers or chills.  He took 324 mg of aspirin and 1 nitroglycerin at home prior to coming in.  He reports some improvement with the nitroglycerin.  ED Course: Upon arrival to the ED, patient is found to be afebrile, saturating well on room air, and with vitals otherwise normal.  EKG features a sinus rhythm with possible AV nodal block and incomplete RBBB.  Chest x-ray features stable COPD changes.  Chemistry panel is notable for a mild hyponatremia.  CBC is unremarkable.  Initial troponin is within normal limits.  Patient was treated with additional nitroglycerin in the emergency department and reports significant  improvement with this.  He remains hemodynamically stable and will be observed in the stepdown unit for ongoing evaluation and management of chest pain with history of CAD.  Review of Systems:  All other systems reviewed and apart from HPI, are negative.  Past Medical History:  Diagnosis Date  . CAD (coronary artery disease)   . COPD (chronic obstructive pulmonary disease) ([place])   . HTN (hypertension)     History reviewed. No pertinent surgical history.   reports that he quit smoking about 35 years ago. His smoking use included cigarettes. He has a 66.00 pack-year smoking history. He has quit using smokeless tobacco. His alcohol and drug histories are not on file.  Allergies  Allergen Reactions  . Sulfa Antibiotics Anaphylaxis and Swelling  . Cefdinir Other (See Comments)    Severe Diarrhea  . Nitrofurantoin Swelling    Hand Swelling  . Ciprofloxacin Itching and Rash  . Levaquin [Levofloxacin] Itching and Rash    Family History  Problem Relation Age of Onset  . Heart disease Mother   . Heart disease Father      Prior to Admission medications   Medication Sig Start Date End Date Taking? Authorizing Provider  ALPRAZolam (XANAX) 0.25 MG tablet Take 0.125-0.25 mg by mouth See admin instructions. Take one tablet (0.25mg ) three times daily and take one half tablet (0.125mg ) twice daily.   Yes [provider]  aspirin EC 81 MG tablet Take 81 mg by mouth daily.   Yes [provider]  atorvastatin (LIPITOR) 20 MG tablet Take 20 mg by mouth daily at 6 PM. 03/21/17  Yes  [provider]  b complex vitamins capsule Take 1 capsule by mouth daily.   Yes [provider]  Cyanocobalamin (VITAMIN B-12 PO) Take 1 tablet by mouth daily.   Yes [provider]  finasteride (PROSCAR) 5 MG tablet Take 5 mg by mouth daily. 03/05/17  Yes [provider]  furosemide (LASIX) 20 MG tablet Take 20-40 mg by mouth See admin instructions. Take one tablet  (20mg ) daily, may take one additional tablet (20mg ) as needed for leg swelling 02/27/17  Yes [provider]  mometasone (NASONEX) 50 MCG/ACT nasal spray Place 2 sprays into the nose daily. 03/05/17  Yes [provider]  Multiple Vitamins-Minerals (PRESERVISION AREDS 2 PO) Take 1 capsule by mouth daily.   Yes [provider]  nitroGLYCERIN (NITROSTAT) 0.4 MG SL tablet Place 0.4 mg under the tongue as needed for chest pain. 03/02/17  Yes [provider]  OXYGEN Inhale 2 L into the lungs continuous.   Yes [provider]  polyethylene glycol (MIRALAX / GLYCOLAX) packet Take 17 g by mouth 2 (two) times daily as needed for mild constipation.   Yes [provider]  predniSONE (DELTASONE) 10 MG tablet Take 10 mg by mouth every morning. 03/21/17  Yes [provider]  ranitidine (ZANTAC) 150 MG tablet Take 150 mg by mouth 2 (two) times daily. 03/21/17  Yes [provider]  sodium chloride (MURO 128) 2 % ophthalmic solution Place 1 drop into both eyes every 4 (four) hours as needed for eye irritation.   Yes [provider]  SPIRIVA RESPIMAT 2.5 MCG/ACT AERS Inhale 2 puffs into the lungs daily. 03/05/17  Yes [provider]  SYMBICORT 160-4.5 MCG/ACT inhaler Inhale 2 puffs into the lungs every 12 (twelve) hours. 03/05/17  Yes [provider]  TOPROL XL 25 MG 24 hr tablet Take 25 mg by mouth daily. 03/22/17  Yes [provider]  XOPENEX HFA 45 MCG/ACT inhaler Inhale 2 puffs into the lungs every 4 (four) hours as needed for shortness of breath or wheezing. 03/21/17  Yes [provider]  pantoprazole (PROTONIX) 40 MG tablet Take 40 mg by mouth daily. 02/20/17   [provider]    Physical Exam: Vitals:   03/24/17 2100 03/24/17 2200 03/24/17 2230 03/24/17 2300  BP: 112/84 118/85 (!) 147/69 109/66  Pulse: 78 72 79 63  Resp: 17 18 (!) 23 19  Temp:      TempSrc:      SpO2: 100% 100% 100% 100%       Constitutional: NAD, calm, frail Eyes: PERTLA, lids and conjunctivae normal ENMT: Mucous membranes are moist. Posterior pharynx clear of any exudate or lesions.   Neck: normal, supple, no masses, no thyromegaly Respiratory: Breath sounds diminished bilaterally. Pursed-lip breathing. No wheezes. No rhonchi. No accessory muscle use.  Cardiovascular: S1 & S2 heard, regular rate and rhythm. 2+ pretibial edema bilaterally. Abdomen: No distension, no tenderness, no masses palpated. Bowel sounds normal.  Musculoskeletal: no clubbing / cyanosis. No joint deformity upper and lower extremities.   Skin: no significant rashes, lesions, ulcers. Warm, dry, well-perfused. Neurologic: CN 2-12 grossly intact. Sensation intact . Strength 5/5 in all 4 limbs.  Psychiatric: Alert and oriented x 3. Pleasant and cooperative.     Labs on Admission: I have personally reviewed following labs and imaging studies  CBC: Recent Labs  Lab 03/24/17 2029  WBC 10.5  HGB 13.5  HCT 42.1  MCV 99.3  PLT 573   Basic Metabolic Panel: Recent  Labs  Lab 03/24/17 2029  NA 133*  K 4.2  CL 92*  CO2 28  GLUCOSE 161*  BUN 18  CREATININE 0.99  CALCIUM 9.3   GFR: CrCl cannot be calculated (Unknown ideal weight.). Liver Function Tests: No results for input(s): AST, ALT, ALKPHOS, BILITOT, PROT, ALBUMIN in the last 168 hours. No results for input(s): LIPASE, AMYLASE in the last 168 hours. No results for input(s): AMMONIA in the last 168 hours. Coagulation Profile: No results for input(s): INR, PROTIME in the last 168 hours. Cardiac Enzymes: No results for input(s): CKTOTAL, CKMB, CKMBINDEX, TROPONINI in the last 168 hours. BNP (last 3 results) No results for input(s): PROBNP in the last 8760 hours. HbA1C: No results for input(s): HGBA1C in the last 72 hours. CBG: No results for input(s): GLUCAP in the last 168 hours. Lipid Profile: No results for input(s): CHOL, HDL, LDLCALC, TRIG, CHOLHDL, LDLDIRECT in  the last 72 hours. Thyroid Function Tests: No results for input(s): TSH, T4TOTAL, FREET4, T3FREE, THYROIDAB in the last 72 hours. Anemia Panel: No results for input(s): VITAMINB12, FOLATE, FERRITIN, TIBC, IRON, RETICCTPCT in the last 72 hours. Urine analysis: No results found for: COLORURINE, APPEARANCEUR, LABSPEC, PHURINE, GLUCOSEU, HGBUR, BILIRUBINUR, KETONESUR, PROTEINUR, UROBILINOGEN, NITRITE, LEUKOCYTESUR Sepsis Labs: @LABRCNTIP (procalcitonin:4,lacticidven:4) )No results found for this or any previous visit (from the past 240 hour(s)).   Radiological Exams on Admission: Dg Chest 2 View  Result Date: 03/24/2017 CLINICAL DATA:  Chest pain. EXAM: CHEST - 2 VIEW COMPARISON:  Radiographs and CT 03/08/2017 FINDINGS: Post median sternotomy. Chronic hyperinflation and emphysema. Normal heart size and mediastinal contours. No pulmonary edema, pleural effusion or pneumothorax. No focal airspace disease. Degenerative change of both shoulders. IMPRESSION: 1. No acute abnormality. 2. Emphysema and chronic hyperinflation, unchanged. Electronically Signed   By: Jeb Levering M.D.   On: 03/24/2017 21:46    EKG: Independently reviewed. Sinus rhythm with questionable AV block, incomplete RBBB.   Assessment/Plan   1. Chest pain; CAD  - Presents with chest pain, becoming increasingly frequent and severe over past couple days, particularly just prior to arrival  - Had remote CABG and reports this is first time he has had chest pain since  - Took ASA 324 and SL NTG x1 at home; improved some with NTG  - Pain improved further with 2 more NTG in ED  - EKG with non-specific changes similar to prior, CXR stable, initial troponin wnl  - He had stress test on 03/12/17 with large infarct involving inferior wall; echo earlier this month with preserved EF and normal wall-motion  - Patient hopes to avoid cath if at all possible  - Continue cardiac monitoring, obtain serial troponin measurements, repeat EKG,  continue ASA, beta-blocker, and statin, continue prn NTG    2. Steroid-dependent COPD; chronic hypoxic respiratory failure  - Stable on admission; baseline 2 Lpm supplemental O2 requirement - Continue supplemental O2, Spiriva, ICS/LABA, daily prednisone, prn Xoponex   3. Chronic diastolic CHF  - Appears roughly euvolemic; no JVD; peripheral edema has improved recently per patient report  - Echo earlier this month with EF 60-65%, normal wall-motion, grade 1 diastolic dysfunction, and mild TR  - Follow daily wts, continue Lasix, beta-blocker     DVT prophylaxis: Lovenox Code Status: Full  Family Communication: Wife updated at bedside Consults called: None Admission status: Observation    Vianne Bulls, MD [facility] Pager [phone]  If 7PM-7AM, please contact night-coverage [email or web address] Password [facility]  03/24/2017, 11:16 PM

## 2017-03-24 NOTE — ED Notes (Signed)
Nurse will draw labs. 

## 2017-03-24 NOTE — ED Notes (Signed)
Delay in lab draw,  Admitting MD at bedside. 

## 2017-03-25 ENCOUNTER — Encounter (HOSPITAL_COMMUNITY): Payer: Self-pay | Admitting: Physician Assistant

## 2017-03-25 ENCOUNTER — Encounter: Payer: Self-pay | Admitting: Internal Medicine

## 2017-03-25 DIAGNOSIS — R079 Chest pain, unspecified: Secondary | ICD-10-CM | POA: Diagnosis not present

## 2017-03-25 DIAGNOSIS — R072 Precordial pain: Secondary | ICD-10-CM

## 2017-03-25 DIAGNOSIS — I5042 Chronic combined systolic (congestive) and diastolic (congestive) heart failure: Secondary | ICD-10-CM

## 2017-03-25 DIAGNOSIS — J449 Chronic obstructive pulmonary disease, unspecified: Secondary | ICD-10-CM | POA: Diagnosis not present

## 2017-03-25 DIAGNOSIS — J9611 Chronic respiratory failure with hypoxia: Secondary | ICD-10-CM | POA: Diagnosis not present

## 2017-03-25 LAB — HEPARIN LEVEL (UNFRACTIONATED)
Heparin Unfractionated: 0.64 IU/mL (ref 0.30–0.70)
Heparin Unfractionated: 0.46 IU/mL (ref 0.30–0.70)

## 2017-03-25 LAB — BASIC METABOLIC PANEL
Anion gap: 7 (ref 5–15)
BUN: 13 mg/dL (ref 6–20)
CO2: 30 mmol/L (ref 22–32)
Calcium: 8.8 mg/dL — ABNORMAL LOW (ref 8.9–10.3)
Chloride: 99 mmol/L — ABNORMAL LOW (ref 101–111)
Creatinine, Ser: 0.91 mg/dL (ref 0.61–1.24)
GFR calc Af Amer: >60 mL/min (ref >60)
GFR calc non Af Amer: >60 mL/min (ref >60)
Glucose, Bld: 79 mg/dL (ref 65–99)
Potassium: 3.9 mmol/L (ref 3.5–5.1)
Sodium: 136 mmol/L (ref 135–145)

## 2017-03-25 LAB — TROPONIN I
Troponin I: 0.05 ng/mL (ref <0.03)
Troponin I: 0.05 ng/mL (ref <0.03)
Troponin I: 0.06 ng/mL (ref <0.03)

## 2017-03-25 LAB — MAGNESIUM: Magnesium: 2.2 mg/dL (ref 1.7–2.4)

## 2017-03-25 MED ORDER — ALPRAZOLAM 0.25 MG PO TABS
0.2500 mg | ORAL_TABLET | Freq: Three times a day (TID) | ORAL | Status: DC
  Filled 2017-03-25 (×3): qty 1

## 2017-03-25 MED ORDER — SODIUM CHLORIDE 0.9 % IV BOLUS (SEPSIS): 250.0000 mL | Freq: Once | INTRAVENOUS | Status: DC

## 2017-03-25 MED ORDER — HEPARIN (PORCINE) IN NACL 100-0.45 UNIT/ML-% IJ SOLN
800.0000 [IU]/h | INTRAMUSCULAR | Status: DC
  Administered 2017-03-25 – 2017-03-26 (×2): 800 [IU]/h via INTRAVENOUS
  Filled 2017-03-25 (×2): qty 250

## 2017-03-25 MED ORDER — OXYMETAZOLINE HCL 0.05 % NA SOLN
1.0000 | Freq: Two times a day (BID) | NASAL | Status: DC | PRN
  Administered 2017-03-26: 1 via NASAL
  Filled 2017-03-25: qty 15

## 2017-03-25 MED ORDER — ALUM & MAG HYDROXIDE-SIMETH 200-200-20 MG/5ML PO SUSP
30.0000 mL | ORAL | Status: DC | PRN
  Administered 2017-03-25: 30 mL via ORAL
  Filled 2017-03-25: qty 30

## 2017-03-25 MED ORDER — SODIUM CHLORIDE 0.9 % IV BOLUS (SEPSIS)
500.0000 mL | Freq: Once | INTRAVENOUS | Status: AC
  Administered 2017-03-25: 500 mL via INTRAVENOUS

## 2017-03-25 MED ORDER — ALPRAZOLAM 0.25 MG PO TABS
0.1250 mg | ORAL_TABLET | Freq: Two times a day (BID) | ORAL | Status: DC
  Administered 2017-03-25 – 2017-03-26 (×3): 0.125 mg via ORAL
  Filled 2017-03-25 (×3): qty 1

## 2017-03-25 MED ORDER — HEPARIN BOLUS VIA INFUSION
3500.0000 [IU] | Freq: Once | INTRAVENOUS | Status: AC
Start: 2017-03-25 — End: 2017-03-25
  Administered 2017-03-25: 3500 [IU] via INTRAVENOUS
  Filled 2017-03-25: qty 3500

## 2017-03-25 MED ORDER — MORPHINE SULFATE (PF) 4 MG/ML IV SOLN: 1.0000 mg | INTRAVENOUS | Status: DC | PRN

## 2017-03-25 MED ORDER — GI COCKTAIL ~~LOC~~
30.0000 mL | Freq: Three times a day (TID) | ORAL | Status: DC | PRN
  Administered 2017-03-25: 30 mL via ORAL
  Filled 2017-03-25 (×2): qty 30

## 2017-03-25 NOTE — Assessment & Plan Note (Signed)
-   PFTs  05/04/05 FEV1 36% ratio 28% with 29% improvement after bronchodilators DLCO 48%  - PFT's 03/26/08 30% ratio 34 with 14% improvement after bronchodilators DLC0 38 %  - Referred to rehab 12/19/2012 > completed March 2015  - changed to spiriva respimat 02/14/2013  - started daily prednisone 06/19/13 > improved only a little> tapered off mid July 2015 > ok to change to prn prednisone 08/27/2013  - flared off nasonex and gerd rx 09/21/2014 > resumed - 10/19/2014  p extensive coaching HFA effectiveness =    90%  - changed pred to ceiling of 20 / floor of 5 mg daily as of 10/19/2014 > changed to floor of 10 mg daily 10/04/2015  - PFT's  06/08/2015  FEV1 0.58 (22 % ) ratio 27  p No sign % improvement from saba p symb/spiriva prior to study with DLCO  9/9 % corrects to 27 % for alv volume   - changed pred to ceiling of 20 and floor of 5 mg daily 06/13/16 > increased floor to 10 mg daily effective 02/20/2017  - referred back to rehab 09/13/2016  - try off noct vent and start titrating up xanax 01/11/2017 > no change but then admitted with flu and placed back on vent  - alpha one AT 03/01/17  MM      Continues to fail to thrive with multiple non-specific complaints and remains pred dependent  The goal with a chronic steroid dependent illness is always arriving at the lowest effective dose that controls the disease/symptoms and not accepting a set "formula" which is based on statistics or guidelines that don't always take into account patient  variability or the natural hx of the dz in every individual patient, which may well vary over time.  For now therefore I recommend the patient maintain  10 mg floor and 20 mg ceiling / review.   I had an extended discussion with the patient reviewing all relevant studies completed to date and  lasting 15 to 20 minutes of a 25 minute visit    Each maintenance medication was reviewed in detail including most importantly the difference between maintenance and prns and under what  circumstances the prns are to be triggered using an action plan format that is not reflected in the computer generated alphabetically organized AVS but trather by a customized med calendar that reflects the AVS meds with confirmed 100% correlation.   In addition, Please see AVS for unique instructions that I personally wrote and verbalized to the the pt in detail and then reviewed with pt  by my nurse highlighting any  changes in therapy recommended at today's visit to their plan of care.

## 2017-03-25 NOTE — Progress Notes (Signed)
ANTICOAGULATION CONSULT NOTE - Initial Consult  Pharmacy Consult for Heparin  Indication: chest pain/ACS  Allergies  Allergen Reactions  . Sulfa Antibiotics Anaphylaxis and Swelling  . Cefdinir Other (See Comments)    Severe Diarrhea  . Nitrofurantoin Swelling    Hand Swelling  . Ciprofloxacin Itching and Rash  . Levaquin [Levofloxacin] Itching and Rash    Patient Measurements: ~60 kg  Vital Signs: Temp: 98 F (36.7 C) (03/16 2018) Temp Source: Oral (03/16 2018) BP: 123/63 (03/17 0030) Pulse Rate: 74 (03/17 0030)  Labs: Recent Labs    03/24/17 2029 03/24/17 2335  HGB 13.5  --   HCT 42.1  --   PLT 261  --   CREATININE 0.99  --   TROPONINI  --  0.05*    CrCl cannot be calculated (Unknown ideal weight.).   Medical History: Past Medical History:  Diagnosis Date  . CAD (coronary artery disease)   . COPD (chronic obstructive pulmonary disease) ([place])   . HTN (hypertension)    Assessment: 84 y/o M with progressive CP over the past few days, troponin 0.05, to start heparin, CBC good, renal function good, PTA meds reviewed.   Goal of Therapy:  Heparin level 0.3-0.7 units/ml Monitor platelets by anticoagulation protocol: Yes   Plan:  Heparin 3500 units BOLUS Start heparin drip at 800 units/hr 0900 HL Daily CBC/HL Monitor for bleeding   Narda Bonds 03/25/2017,12:49 AM

## 2017-03-25 NOTE — ED Notes (Signed)
Cardiologist at bedside.  

## 2017-03-25 NOTE — Consult Note (Addendum)
Cardiology Consultation:   Patient ID: YOUSUF [name]; [id number]; [birth year]   Admit date: 03/24/2017 Date of Consult: 03/25/2017  Primary Care Provider: [name], MD Primary Cardiologist: Dr Tamala Julian, 03/21/2017 Primary Electrophysiologist:  n/a   Patient Profile:   DETAVIOUS [name] is a 84 y.o. male with a hx of CABG 2003, HTN, COPD on steroids w/ chronic resp failure, cor pulmonale,FEV1 0.58 in 2017, on 2 L oxygen and nocturnal ventilation, uses trilogy machine,hyperlipidemia, PAD, history of carotid obstructive disease, hx RBBB, who is being seen today for the evaluation of chest pain at the request of Dr Myna Hidalgo.  History of Present Illness:   [name] was admitted 01/27-02/01/2017 for Flu A and respiratory issues. Admitted 02/28-03/04 with CHF exacerbation, COPD also a little worse, palpitations w/ PACs on telemetry, weakness. MV intermediate risk, med rx, wt 126 lbs.  Travis. [name] was seen by Dr Tamala Julian 03/13 and was doing better from a respiratory standpoint. Wt 133 lbs.  Yesterday, [name] developed chest pain, a tightness and tingling. It went all the way across his chest but settled on the L side. A little worse with deep inspiration. 6-7/10. His SOB was a little worse. No N&V, started sweating a little later on.   He took SL NTG x 1 at home, no relief, came to the ER and got 2 more NTG and got pain relief. He was put on nitro, but his BP dropped and the nitro was d/c'd. He got more discomfort but was given a GI cocktail and his sx resolved. They have not returned.  He gets a twinge of pain upper L chest with head movement in that direction, but it resolves with movements.  His breathing is ok, no worse than usual, although he has not fully recovered from the Flu in January.   No LE edema, no new orthopnea or PND.     **for additional information on PMH, PSH, FH, Soc Hx, Meds, etc, see MRN:  [id number]**  Past Medical History:  Diagnosis Date  . CAD (coronary artery disease)     . Chronic respiratory failure ([place])   . COPD (chronic obstructive pulmonary disease) ([place])   . HTN (hypertension)     Past Surgical History:  Procedure Laterality Date  . CORONARY ARTERY BYPASS GRAFT       Prior to Admission medications   Medication Sig Start Date End Date Taking? Authorizing Provider  ALPRAZolam (XANAX) 0.25 MG tablet Take 0.125-0.25 mg by mouth See admin instructions. Take one tablet (0.25mg ) three times daily and take one half tablet (0.125mg ) twice daily.   Yes [provider]  aspirin EC 81 MG tablet Take 81 mg by mouth daily.   Yes [provider]  atorvastatin (LIPITOR) 20 MG tablet Take 20 mg by mouth daily at 6 PM. 03/21/17  Yes [provider]  b complex vitamins capsule Take 1 capsule by mouth daily.   Yes [provider]  Cyanocobalamin (VITAMIN B-12 PO) Take 1 tablet by mouth daily.   Yes [provider]  finasteride (PROSCAR) 5 MG tablet Take 5 mg by mouth daily. 03/05/17  Yes [provider]  furosemide (LASIX) 20 MG tablet Take 20-40 mg by mouth See admin instructions. Take one tablet (20mg ) daily, may take one additional tablet (20mg ) as needed for leg swelling 02/27/17  Yes [provider]  mometasone (NASONEX) 50 MCG/ACT nasal spray Place 2 sprays into the nose daily. 03/05/17  Yes [provider]  Multiple Vitamins-Minerals (PRESERVISION  AREDS 2 PO) Take 1 capsule by mouth daily.   Yes [provider]  nitroGLYCERIN (NITROSTAT) 0.4 MG SL tablet Place 0.4 mg under the tongue as needed for chest pain. 03/02/17  Yes [provider]  OXYGEN Inhale 2 L into the lungs continuous.   Yes [provider]  polyethylene glycol (MIRALAX / GLYCOLAX) packet Take 17 g by mouth 2 (two) times daily as needed for mild constipation.   Yes [provider]  predniSONE (DELTASONE) 10 MG tablet Take 10 mg by mouth every morning. 03/21/17  Yes [provider]   ranitidine (ZANTAC) 150 MG tablet Take 150 mg by mouth 2 (two) times daily. 03/21/17  Yes [provider]  sodium chloride (MURO 128) 2 % ophthalmic solution Place 1 drop into both eyes every 4 (four) hours as needed for eye irritation.   Yes [provider]  SPIRIVA RESPIMAT 2.5 MCG/ACT AERS Inhale 2 puffs into the lungs daily. 03/05/17  Yes [provider]  SYMBICORT 160-4.5 MCG/ACT inhaler Inhale 2 puffs into the lungs every 12 (twelve) hours. 03/05/17  Yes [provider]  TOPROL XL 25 MG 24 hr tablet Take 25 mg by mouth daily. 03/22/17  Yes [provider]  XOPENEX HFA 45 MCG/ACT inhaler Inhale 2 puffs into the lungs every 4 (four) hours as needed for shortness of breath or wheezing. 03/21/17  Yes [provider]  pantoprazole (PROTONIX) 40 MG tablet Take 40 mg by mouth daily. 02/20/17   [provider]    Inpatient Medications: Scheduled Meds: . ALPRAZolam  0.25 mg Oral TID   And  . ALPRAZolam  0.125 mg Oral BID  . aspirin EC  81 mg Oral Daily  . atorvastatin  20 mg Oral q1800  . B-complex with vitamin C  1 tablet Oral Daily  . famotidine  20 mg Oral BID  . finasteride  5 mg Oral Daily  . fluticasone  2 spray Each Nare Daily  . furosemide  20 mg Oral Daily  . metoprolol succinate  25 mg Oral Daily  . mometasone-formoterol  2 puff Inhalation BID  . pantoprazole  40 mg Oral Daily  . predniSONE  10 mg Oral Q breakfast  . tiotropium  18 mcg Inhalation Daily   Continuous Infusions: . heparin 800 Units/hr (03/25/17 0133)   PRN Meds: acetaminophen, albuterol, gi cocktail, morphine injection, ondansetron (ZOFRAN) IV, polyethylene glycol, sodium chloride, zolpidem  Allergies:    Allergies  Allergen Reactions  . Sulfa Antibiotics Anaphylaxis and Swelling  . Cefdinir Other (See Comments)    Severe Diarrhea  . Nitrofurantoin Swelling    Hand Swelling  . Ciprofloxacin Itching and Rash  . Levaquin [Levofloxacin] Itching and  Rash    Social History:   Social History   Socioeconomic History  . Marital status: Married    Spouse name: Not on file  . Number of children: Not on file  . Years of education: Not on file  . Highest education level: Not on file  Social Needs  . Financial resource strain: Not on file  . Food insecurity - worry: Not on file  . Food insecurity - inability: Not on file  . Transportation needs - medical: Not on file  . Transportation needs - non-medical: Not on file  Occupational History  . Not on file  Tobacco Use  . Smoking status: Former Smoker    Packs/day: 2.00    Years: 33.00    Pack years: 66.00    Types:  Cigarettes    Last attempt to quit: 03/25/1982    Years since quitting: 35.0  . Smokeless tobacco: Former Network engineer and Sexual Activity  . Alcohol use: Not on file  . Drug use: Not on file  . Sexual activity: Not on file  Other Topics Concern  . Not on file  Social History Narrative  . Not on file    Family History:   Family History  Problem Relation Age of Onset  . Heart disease Mother   . Heart disease Father    Family Status:  Family Status  Relation Name Status  . Mother  (Not Specified)  . Father  (Not Specified)    ROS:  Please see the history of present illness.  All other ROS reviewed and negative.     Physical Exam/Data:   Vitals:   03/25/17 1200 03/25/17 1242 03/25/17 1245 03/25/17 1300  BP: (!) 156/104 (!) 155/95 132/80 116/71  Pulse: 88 88 88 79  Resp: 20  19 17   Temp:      TempSrc:      SpO2: 97%  97% 99%    Intake/Output Summary (Last 24 hours) at 03/25/2017 1440 Last data filed at 03/25/2017 0211 Gross per 24 hour  Intake 500 ml  Output -  Net 500 ml   General:  Frail, elderly male, in no acute distress HEENT: normal for age Lymph: no adenopathy Neck: minimal JVD Endocrine:  No thryomegaly Vascular: No carotid bruits; 4/4 extremity pulses 2+, without bruits  Cardiac:  normal S1, S2; RRR (Irregular at times due to  PVCs); no murmur  Lungs:  clear to auscultation bilaterally, no wheezing, rhonchi or rales  Abd: soft, nontender, no hepatomegaly  Ext: no edema Musculoskeletal:  No deformities, BUE and BLE strength normal and equal Skin: warm and dry  Neuro:  CNs 2-12 intact, no focal abnormalities noted Psych:  Normal affect   EKG:  The EKG was personally reviewed and demonstrates:  03/16, SR, HR 93, RBBB is old, PVCs seen Telemetry:  Telemetry was personally reviewed and demonstrates:  SR w/ frequent PVCs and PACs  Relevant CV Studies:  ECHO 03/10/27  Study Conclusions - Left ventricle: The cavity size was normal. Systolic function was normal. The estimated ejection fraction was in the range of 60% to 65%. Wall motion was normal; there were no regional wall motion abnormalities. Doppler parameters are consistent with abnormal left ventricular relaxation (grade 1 diastolic dysfunction). - Aortic valve: Transvalvular velocity was within the normal range. There was no stenosis. There was no regurgitation. - Mitral valve: Transvalvular velocity was within the normal range. There was no evidence for stenosis. There was trivial regurgitation. - Right ventricle: The cavity size was normal. Wall thickness was normal. Systolic function was reduced. - Tricuspid valve: There was mild regurgitation. - Pulmonary arteries: Systolic pressure was within the normal range. PA peak pressure: 24 mm Hg (S). - Pericardium, extracardiac: A trivial pericardial effusion was identified.  CABG: 2003 LIMA to LAD, SVG to OM1-2, SVG to RCA and PDA  MYOVIEW: 03/13/2017 IMPRESSION: 1. Large infarct involving the inferior wall including the inferior apex. Difficult to exclude peri-infarct ischemia at the apex and recommend correlation with ECG testing. 2. Abnormal wall motion along the inferior wall base and septal wall as described. 3. Left ventricular ejection fraction is 48%. 4. Non  invasive risk stratification*: Intermediate  Laboratory Data:  Chemistry Recent Labs  Lab 03/24/17 2029 03/25/17 0921  NA 133* 136  K 4.2 3.9  CL 92* 99*  CO2 28 30  GLUCOSE 161* 79  BUN 18 13  CREATININE 0.99 0.91  CALCIUM 9.3 8.8*  GFRNONAA >60 >60  GFRAA >60 >60  ANIONGAP 13 7     Hematology Recent Labs  Lab 03/24/17 2029  WBC 10.5  RBC 4.24  HGB 13.5  HCT 42.1  MCV 99.3  MCH 31.8  MCHC 32.1  RDW 13.1  PLT 261   Cardiac Enzymes Recent Labs  Lab 03/24/17 2335 03/25/17 0500 03/25/17 0915  TROPONINI 0.05* 0.05* 0.06*    Recent Labs  Lab 03/24/17 2041  TROPIPOC 0.03    BNP Recent Labs  Lab 03/24/17 2131  BNP 190.3*    Magnesium:  Magnesium  Date Value Ref Range Status  03/25/2017 2.2 1.7 - 2.4 mg/dL Final    Comment:    Performed at [facility], [place] [address], [place], [place] [address]     Radiology/Studies:  Dg Chest 2 View  Result Date: 03/24/2017 CLINICAL DATA:  Chest pain. EXAM: CHEST - 2 VIEW COMPARISON:  Radiographs and CT 03/08/2017 FINDINGS: Post median sternotomy. Chronic hyperinflation and emphysema. Normal heart size and mediastinal contours. No pulmonary edema, pleural effusion or pneumothorax. No focal airspace disease. Degenerative change of both shoulders. IMPRESSION: 1. No acute abnormality. 2. Emphysema and chronic hyperinflation, unchanged. Electronically Signed   By: Jeb Levering M.D.   On: 03/24/2017 21:46    Assessment and Plan:   Principal Problem: 1.  Chest pain - int risk MV 03/13/2017 - however, pt w/ multiple comorbidities and cath would be at increased risk. - add nitrates.  Active Problems:   CAD (coronary artery disease)   Chronic diastolic CHF (congestive heart failure) (H[facility])   COPD (chronic obstructive pulmonary disease) (H[facility])   Chronic respiratory failure with hypoxia ([place])     For questions or updates, please contact [facility] Please consult [email or web address] for contact info under  Cardiology/STEMI.   Signed, Rosaria Ferries, PA-C  03/25/2017 2:40 PM   History and all data above reviewed.  Patient examined.  I agree with the findings as above.  The patient is well known to Dr. Tamala Julian.  He has a long history of cardiac disease as above.  He presents with chest pain.  This was sharp and atypical.  He had a perfusions study earlier this month with a perfusion defect.  EKG demonstrated no acute changes.  His pain did not go away with NTG although he became hypotensive.  He did have improvement after a GI cocktail.   The patient exam reveals COR:RRR  ,  Lungs: Clear  ,  Abd: Positive bowel sounds, no rebound no guarding, Ext No edema  .  All available labs, radiology testing, previous records reviewed. Agree with documented assessment and plan. Chest pain:  No objective evidence of ischemia.  I would not suggest further work up given the recent stress test.  However, I would suggest increased antianginal meds as far as his BP allows.  We will add Imdur.  This and his beta blocker could possibly be increased over time as an out patient.    Jeneen Rinks Daymon Hora  2:52 PM  03/25/2017

## 2017-03-25 NOTE — Progress Notes (Signed)
PROGRESS NOTE    [name]  IOX:[id number] DOB: [birth year] DOA: 03/24/2017 PCP: [name], MD   Brief Narrative:  84 y.o. male PMHx HTN, CAD S/P remote CABG, and COPD with chronic hypoxic respiratory failure and steroid dependence,   Presenting to the emergency department for evaluation of chest pain.  Patient reports that he had not experienced any chest pain remote CABG until late last month when, after suffering from COPD exacerbation secondary to influenza, he developed chest pain and had elevated troponin that was attributed to demand ischemia.  He underwent stress test on 03/12/2017 Large infarct involving the inferior wall including the inferior apex..  He had returned to his usual state and was doing fairly well at home until developing a recurrence in his chest discomfort recently.  This resolved spontaneously, but he has had increasingly frequent and more severe recurrences now, particularly for the past 1-2 hours prior to his arrival in the ED.  Denies any increase in his chronic dyspnea, reports that his bilateral lower extremity swelling has been improving, and denies fevers or chills.  He took 324 mg of aspirin and 1 nitroglycerin at home prior to coming in.  He reports some improvement with the nitroglycerin.   ED Course: Upon arrival to the ED, patient is found to be afebrile, saturating well on room air, and with vitals otherwise normal.  EKG features a sinus rhythm with possible AV nodal block and incomplete RBBB.  Chest x-ray features stable COPD changes.  Chemistry panel is notable for a mild hyponatremia.  CBC is unremarkable.  Initial troponin is within normal limits.  Patient was treated with additional nitroglycerin in the emergency department and reports significant improvement with this.  He remains hemodynamically stable and will be observed in the stepdown unit for ongoing evaluation and management of chest pain with history of CAD.    Subjective: 3/17 A/O  4,negative abdominal pain, chronic SOB. Positive residual chest pain described as pain in his left shoulder and neck when he rotates his head. Substernal chest pain resolved.   Assessment & Plan:   Principal Problem:   Chest pain Active Problems:   CAD (coronary artery disease)   Chronic diastolic CHF (congestive heart failure) (HCC)   COPD (chronic obstructive pulmonary disease) ([facility])   Chronic respiratory failure with hypoxia ([facility])    Chronic Systolic and Diastolic CHF  -remote history of CABG -although earlier this month echocardiogram showed normal EF, nuclear med stress test showed reduced EF= 48% see results below -strict in and out -Daily weight -appears euvolemic -lipid panel pending -Lipitor 20 mg daily -transfusion hemoglobin<8  -Unstable angina -HEART score= 6 -patient with unprovoked substernal chest pain with radiation to his left shoulder and neck relieved with nitroglycerin -NTG discontinue secondary to hypotension -cardiology notified patient admitted. -trend cardiac enzymes Recent Labs  Lab 03/24/17 2335 03/25/17 0500 03/25/17 0915  TROPONINI 0.05* 0.05* 0.06*  -mildly positive but given patient's history will most likely require cardiac catheterization -EKG from 3/16 compared to 2/28 both abnormal changes. -echocardiogram pending  COPD steroid-dependent/Chronic Respiratory Failure with Hypoxia  -at baseline 2 L O2 via [place] -prednisone 10 mg daily -Spiriva daily -Symbicort 160-45 micrograms BID(Dulera 200-5 micrograms instead of Symbicort) -Titrate to maintain SPO2 89-93%          DVT prophylaxis:heparin drip Code Status: full Family Communication: wife at bedside for discussion of plan of care Disposition Plan: TBD   Consultants:  Cardiology     Procedures/Significant Events:  3/1 Echocardiogram;-  LVEF= 60%-65%. -Grade 1 diastolic dysfunction 3/4 nuclear med stress test: -Large infarct involving the inferior wall including the  inferior apex. Difficult to exclude peri-infarct ischemia at the apex  - Abnormal wall motion along the inferior wall base and septal wall -Left ventricular ejection fraction is 48%. -Non invasive risk stratification*: Intermediate    I have personally reviewed and interpreted all radiology studies and my findings are as above.  VENTILATOR SETTINGS:    Cultures   Antimicrobials: Anti-infectives (From admission, onward)   None       Devices    LINES / TUBES:      Continuous Infusions: . heparin 800 Units/hr (03/25/17 0133)     Objective: Vitals:   03/25/17 0616 03/25/17 0630 03/25/17 0700 03/25/17 0800  BP: 125/75 137/73 127/75 106/64  Pulse: 70 80 63 64  Resp: 18 20 12 13   Temp:      TempSrc:      SpO2: 95% 97% 100% 100%    Intake/Output Summary (Last 24 hours) at 03/25/2017 0837 Last data filed at 03/25/2017 0211 Gross per 24 hour  Intake 500 ml  Output -  Net 500 ml   There were no vitals filed for this visit.  Examination:  General: A/O 4, positive chronic respiratory distress, cachectic Neck:  Negative scars, masses, torticollis, lymphadenopathy, JVD Lungs: diffuse poor air movement, without wheezes or crackles Cardiovascular: Regular rate and rhythm without murmur gallop or rub normal S1 and S2 Abdomen: negative abdominal pain, nondistended, positive soft, bowel sounds, no rebound, no ascites, no appreciable mass Extremities: No significant cyanosis, clubbing, possible bilateral lower extremity 1+ pedal edema (chronic per patient).o Skin: Negative rashes, lesions, ulcers Psychiatric:  Negative depression, negative anxiety, negative fatigue, negative mania  Central nervous system:  Cranial nerves II through XII intact, tongue/uvula midline, all extremities muscle strength 5/5, sensation intact throughout, negative dysarthria, negative expressive aphasia, negative receptive aphasia.  .     Data Reviewed: Care during the described time  interval was provided by me .  I have reviewed this patient's available data, including medical history, events of note, physical examination, and all test results as part of my evaluation.   CBC: Recent Labs  Lab 03/24/17 2029  WBC 10.5  HGB 13.5  HCT 42.1  MCV 99.3  PLT 673   Basic Metabolic Panel: Recent Labs  Lab 03/24/17 2029  NA 133*  K 4.2  CL 92*  CO2 28  GLUCOSE 161*  BUN 18  CREATININE 0.99  CALCIUM 9.3   GFR: CrCl cannot be calculated (Unknown ideal weight.). Liver Function Tests: No results for input(s): AST, ALT, ALKPHOS, BILITOT, PROT, ALBUMIN in the last 168 hours. No results for input(s): LIPASE, AMYLASE in the last 168 hours. No results for input(s): AMMONIA in the last 168 hours. Coagulation Profile: No results for input(s): INR, PROTIME in the last 168 hours. Cardiac Enzymes: Recent Labs  Lab 03/24/17 2335 03/25/17 0500  TROPONINI 0.05* 0.05*   BNP (last 3 results) No results for input(s): PROBNP in the last 8760 hours. HbA1C: No results for input(s): HGBA1C in the last 72 hours. CBG: No results for input(s): GLUCAP in the last 168 hours. Lipid Profile: No results for input(s): CHOL, HDL, LDLCALC, TRIG, CHOLHDL, LDLDIRECT in the last 72 hours. Thyroid Function Tests: No results for input(s): TSH, T4TOTAL, FREET4, T3FREE, THYROIDAB in the last 72 hours. Anemia Panel: No results for input(s): VITAMINB12, FOLATE, FERRITIN, TIBC, IRON, RETICCTPCT in the last 72 hours. Urine analysis: No results found  for: COLORURINE, APPEARANCEUR, LABSPEC, PHURINE, GLUCOSEU, HGBUR, BILIRUBINUR, KETONESUR, PROTEINUR, UROBILINOGEN, NITRITE, LEUKOCYTESUR Sepsis Labs: @LABRCNTIP (procalcitonin:4,lacticidven:4)  )No results found for this or any previous visit (from the past 240 hour(s)).       Radiology Studies: Dg Chest 2 View  Result Date: 03/24/2017 CLINICAL DATA:  Chest pain. EXAM: CHEST - 2 VIEW COMPARISON:  Radiographs and CT 03/08/2017 FINDINGS: Post  median sternotomy. Chronic hyperinflation and emphysema. Normal heart size and mediastinal contours. No pulmonary edema, pleural effusion or pneumothorax. No focal airspace disease. Degenerative change of both shoulders. IMPRESSION: 1. No acute abnormality. 2. Emphysema and chronic hyperinflation, unchanged. Electronically Signed   By: Jeb Levering M.D.   On: 03/24/2017 21:46        Scheduled Meds: . ALPRAZolam  0.25 mg Oral TID   And  . ALPRAZolam  0.125 mg Oral BID  . aspirin EC  81 mg Oral Daily  . atorvastatin  20 mg Oral q1800  . B-complex with vitamin C  1 tablet Oral Daily  . famotidine  20 mg Oral BID  . finasteride  5 mg Oral Daily  . fluticasone  2 spray Each Nare Daily  . furosemide  20 mg Oral Daily  . metoprolol succinate  25 mg Oral Daily  . mometasone-formoterol  2 puff Inhalation BID  . pantoprazole  40 mg Oral Daily  . predniSONE  10 mg Oral Q breakfast  . tiotropium  18 mcg Inhalation Daily   Continuous Infusions: . heparin 800 Units/hr (03/25/17 0133)     LOS: 0 days    Time spent: 40 minutes   Myeasha Ballowe, Geraldo Docker, MD [facility] Pager [phone]   If 7PM-7AM, please contact night-coverage [email or web address] Password [facility] 03/25/2017, 8:37 AM

## 2017-03-25 NOTE — ED Notes (Signed)
Doctor at bedside.

## 2017-03-25 NOTE — ED Notes (Signed)
Ordered hosp bed

## 2017-03-25 NOTE — Plan of Care (Signed)
Oriented to unit. Heparin gtt continued. Continuous cardiac monitoring, BP and pulse ox. Encouraged to call for needs oob etc.

## 2017-03-25 NOTE — Progress Notes (Signed)
ANTICOAGULATION CONSULT NOTE - Follow Up Consult  Pharmacy Consult for Heparin Indication: chest pain/ACS  Allergies  Allergen Reactions  . Sulfa Antibiotics Anaphylaxis and Swelling  . Cefdinir Other (See Comments)    Severe Diarrhea  . Nitrofurantoin Swelling    Hand Swelling  . Ciprofloxacin Itching and Rash  . Levaquin [Levofloxacin] Itching and Rash    Patient Measurements: Wt ~60 kg  Vital Signs: Temp: 97.9 F (36.6 C) (03/17 1914) Temp Source: Oral (03/17 1914) BP: 87/50 (03/17 1914) Pulse Rate: 73 (03/17 1914)  Labs: Recent Labs    03/24/17 2029 03/24/17 2335 03/25/17 0500 03/25/17 0915 03/25/17 0921 03/25/17 0922 03/25/17 1859  HGB 13.5  --   --   --   --   --   --   HCT 42.1  --   --   --   --   --   --   PLT 261  --   --   --   --   --   --   HEPARINUNFRC  --   --   --   --   --  0.64 0.46  CREATININE 0.99  --   --   --  0.91  --   --   TROPONINI  --  0.05* 0.05* 0.06*  --   --   --     Estimated Creatinine Clearance: 48.8 mL/min (by C-G formula based on SCr of 0.91 mg/dL).   Medications:  Infusions:  . heparin 800 Units/hr (03/25/17 0133)    Assessment: 84 year old male on IV Heparin for chest pain.  Heparin level therapeutic: 0.46. No overt bleeding reported  Goal of Therapy:  Heparin level 0.3-0.7 units/ml Monitor platelets by anticoagulation protocol: Yes   Plan:  Continue Heparin at 800 units/hr.  Daily Heparin level and CBC.   Georga Bora, PharmD Clinical Pharmacist 03/25/2017 8:23 PM

## 2017-03-25 NOTE — Progress Notes (Signed)
ANTICOAGULATION CONSULT NOTE - Follow Up Consult  Pharmacy Consult for Heparin Indication: chest pain/ACS  Allergies  Allergen Reactions  . Sulfa Antibiotics Anaphylaxis and Swelling  . Cefdinir Other (See Comments)    Severe Diarrhea  . Nitrofurantoin Swelling    Hand Swelling  . Ciprofloxacin Itching and Rash  . Levaquin [Levofloxacin] Itching and Rash    Patient Measurements: Wt ~60 kg  Vital Signs: BP: 106/64 (03/17 0800) Pulse Rate: 64 (03/17 0800)  Labs: Recent Labs    03/24/17 2029 03/24/17 2335 03/25/17 0500 03/25/17 0922  HGB 13.5  --   --   --   HCT 42.1  --   --   --   PLT 261  --   --   --   HEPARINUNFRC  --   --   --  0.64  CREATININE 0.99  --   --   --   TROPONINI  --  0.05* 0.05*  --     CrCl cannot be calculated (Unknown ideal weight.).   Medications:  Infusions:  . heparin 800 Units/hr (03/25/17 0133)    Assessment: 84 year old male on IV Heparin for chest pain.   Initial Heparin level is therapeutic at 0.64 after bolus and rate of 800 units/hr. CBC is within normal limits. No bleeding reported.   Goal of Therapy:  Heparin level 0.3-0.7 units/ml Monitor platelets by anticoagulation protocol: Yes   Plan:  Continue Heparin at 800 units/hr.  Repeat Heparin level in 8 hours to confirm.  Daily Heparin level and CBC.   Sloan Leiter, PharmD, BCPS, BCCCP Clinical Pharmacist Clinical phone 03/25/2017 until 3:[phone] After hours, please call #[phone] 03/25/2017,10:08 AM

## 2017-03-25 NOTE — Assessment & Plan Note (Signed)
-   Start noct 02 at 2lpm 2008 and on 03/04/09 desat @ > 185 ft so rec wear with activtiy > rm to rm  - 02/15/2011   Walked RA x one lap @ 185 stopped due to  desat > corrected on 2lpm - 02/26/2014  Walked 2lpm  2 laps @ 185 ft each stopped due to  Sob/ sats 88% at nl pace  - 10/19/2014   Walked RA  2 laps @ 185 ft each stopped due to  End of study, slow pace,  sob  But no  desat   05/21/15 Eval by Dr Dedio/ rec trial of NIV  - HCO3  10/27/15 = 34  - HC03  10/06/16  = 28 ? On noct vent (non compliant)  As of admit  02/04/17 back on noct vent/ 02 2lpm 24/7   Adequate control on present rx, reviewed in detail with pt > no change in rx needed

## 2017-03-25 NOTE — Assessment & Plan Note (Signed)
Sinus CT 03/03/10 chronic changes only  - rec rechallenge with qnasal 12/21/11  - Trial of 1st gen h1 and hs h2 rec 05/23/2012  - Allergy profile 12/03/2012-negative-IgE 94.8     Food IgE profile-negative - rec rechallnge with 1st gen H1 06/01/14 > could not tolerate either 1st gen H1   nor Zyrtec so recommended go back to Claritin prn 10/19/2014  - reviewed approp use of nasal steroids/ afrin 06/13/2016    Becoming afrin dependent which he can probably minimize by just treating one side at a time, always chasing the afrin with Nasal steroids, and changing sides every 3 days

## 2017-03-25 NOTE — ED Notes (Signed)
Pt tooke his home Symbicort, sts he needs it.

## 2017-03-25 NOTE — ED Notes (Signed)
Pts spouse came to nursing desk and stated that her husband was having bad chest pain. RN went to evaluate patient and notified MD. MD aware and orders received.

## 2017-03-26 ENCOUNTER — Encounter: Payer: Self-pay | Admitting: Internal Medicine

## 2017-03-26 DIAGNOSIS — J449 Chronic obstructive pulmonary disease, unspecified: Secondary | ICD-10-CM | POA: Diagnosis not present

## 2017-03-26 DIAGNOSIS — J9611 Chronic respiratory failure with hypoxia: Secondary | ICD-10-CM

## 2017-03-26 DIAGNOSIS — I5032 Chronic diastolic (congestive) heart failure: Secondary | ICD-10-CM

## 2017-03-26 DIAGNOSIS — R079 Chest pain, unspecified: Secondary | ICD-10-CM

## 2017-03-26 LAB — BASIC METABOLIC PANEL
Anion gap: 8 (ref 5–15)
BUN: 13 mg/dL (ref 6–20)
CO2: 32 mmol/L (ref 22–32)
Calcium: 9 mg/dL (ref 8.9–10.3)
Chloride: 99 mmol/L — ABNORMAL LOW (ref 101–111)
Creatinine, Ser: 0.97 mg/dL (ref 0.61–1.24)
GFR calc Af Amer: >60 mL/min (ref >60)
GFR calc non Af Amer: >60 mL/min (ref >60)
Glucose, Bld: 86 mg/dL (ref 65–99)
Potassium: 4.3 mmol/L (ref 3.5–5.1)
Sodium: 139 mmol/L (ref 135–145)

## 2017-03-26 LAB — CBC
HCT: 35.7 % — ABNORMAL LOW (ref 39.0–52.0)
Hemoglobin: 11.9 g/dL — ABNORMAL LOW (ref 13.0–17.0)
MCH: 33.1 pg (ref 26.0–34.0)
MCHC: 33.3 g/dL (ref 30.0–36.0)
MCV: 99.2 fL (ref 78.0–100.0)
Platelets: 198 10*3/uL (ref 150–400)
RBC: 3.6 MIL/uL — ABNORMAL LOW (ref 4.22–5.81)
RDW: 13.3 % (ref 11.5–15.5)
WBC: 7 10*3/uL (ref 4.0–10.5)

## 2017-03-26 LAB — HEPARIN LEVEL (UNFRACTIONATED): Heparin Unfractionated: 0.69 IU/mL (ref 0.30–0.70)

## 2017-03-26 LAB — LIPID PANEL
Cholesterol: 146 mg/dL (ref 0–200)
HDL: 79 mg/dL (ref >40)
LDL Cholesterol: 60 mg/dL (ref 0–99)
Total CHOL/HDL Ratio: 1.8 RATIO
Triglycerides: 34 mg/dL (ref <150)
VLDL: 7 mg/dL (ref 0–40)

## 2017-03-26 LAB — MAGNESIUM: Magnesium: 2.1 mg/dL (ref 1.7–2.4)

## 2017-03-26 MED ORDER — GUAIFENESIN ER 600 MG PO TB12: 600.0000 mg | ORAL_TABLET | Freq: Two times a day (BID) | ORAL | 2 refills | Status: DC

## 2017-03-26 MED ORDER — OXYMETAZOLINE HCL 0.05 % NA SOLN: 1.0000 | Freq: Two times a day (BID) | NASAL | 0 refills | Status: DC | PRN

## 2017-03-26 MED ORDER — FUROSEMIDE 20 MG PO TABS: 20.0000 mg | ORAL_TABLET | Freq: Every day | ORAL | 0 refills | Status: DC

## 2017-03-26 MED ORDER — RANOLAZINE ER 1000 MG PO TB12: 1000.0000 mg | ORAL_TABLET | Freq: Two times a day (BID) | ORAL | 0 refills | Status: DC

## 2017-03-26 MED ORDER — RANOLAZINE ER 500 MG PO TB12
1000.0000 mg | ORAL_TABLET | Freq: Two times a day (BID) | ORAL | Status: DC
  Administered 2017-03-26: 1000 mg via ORAL
  Filled 2017-03-26: qty 2

## 2017-03-26 NOTE — Progress Notes (Signed)
Progress Note  Note: Pt has another chart, marked for merge. see MRN:  [id number]  Patient Name: [name] Date of Encounter: 03/26/2017  Primary Cardiologist: Dr. Tamala Julian   Subjective   He is currently CP free. Has chronic dyspnea at baseline. Uses home O2.   Inpatient Medications    Scheduled Meds: . ALPRAZolam  0.25 mg Oral TID   And  . ALPRAZolam  0.125 mg Oral BID  . aspirin EC  81 mg Oral Daily  . atorvastatin  20 mg Oral q1800  . B-complex with vitamin C  1 tablet Oral Daily  . famotidine  20 mg Oral BID  . finasteride  5 mg Oral Daily  . furosemide  20 mg Oral Daily  . metoprolol succinate  25 mg Oral Daily  . mometasone-formoterol  2 puff Inhalation BID  . pantoprazole  40 mg Oral Daily  . predniSONE  10 mg Oral Q breakfast  . tiotropium  18 mcg Inhalation Daily   Continuous Infusions: . heparin 800 Units/hr (03/26/17 0153)   PRN Meds: acetaminophen, albuterol, alum & mag hydroxide-simeth, morphine injection, ondansetron (ZOFRAN) IV, oxymetazoline, polyethylene glycol, sodium chloride, zolpidem   Vital Signs    Vitals:   03/25/17 1513 03/25/17 1914 03/26/17 0349 03/26/17 0800  BP: 112/68 (!) 87/50 (!) 146/94 115/65  Pulse: 71 73 85   Resp: 18 15 16    Temp: 98 F (36.7 C) 97.9 F (36.6 C) 97.9 F (36.6 C) (!) 97.5 F (36.4 C)  TempSrc: Oral Oral Oral Oral  SpO2: 96% 99% 95% 96%  Weight: 128 lb (58.1 kg)  126 lb 1.6 oz (57.2 kg)   Height: 5\' 9"  (1.753 m)       Intake/Output Summary (Last 24 hours) at 03/26/2017 0915 Last data filed at 03/26/2017 0900 Gross per 24 hour  Intake 475.6 ml  Output 330 ml  Net 145.6 ml   Filed Weights   03/25/17 1513 03/26/17 0349  Weight: 128 lb (58.1 kg) 126 lb 1.6 oz (57.2 kg)    Telemetry    NSR/ sinus tach - Personally Reviewed  ECG    NSR w/ PVCs - Personally Reviewed  Physical Exam   GEN: No acute distress.   Neck: No JVD Cardiac: RRR, no murmurs, rubs, or gallops.  Respiratory: Clear to  auscultation bilaterally. GI: Soft, nontender, non-distended  MS: No edema; No deformity. Neuro:  Nonfocal  Psych: Normal affect   Labs    Chemistry Recent Labs  Lab 03/24/17 2029 03/25/17 0921 03/26/17 0621  NA 133* 136 139  K 4.2 3.9 4.3  CL 92* 99* 99*  CO2 28 30 32  GLUCOSE 161* 79 86  BUN 18 13 13   CREATININE 0.99 0.91 0.97  CALCIUM 9.3 8.8* 9.0  GFRNONAA >60 >60 >60  GFRAA >60 >60 >60  ANIONGAP 13 7 8      Hematology Recent Labs  Lab 03/24/17 2029 03/26/17 0621  WBC 10.5 7.0  RBC 4.24 3.60*  HGB 13.5 11.9*  HCT 42.1 35.7*  MCV 99.3 99.2  MCH 31.8 33.1  MCHC 32.1 33.3  RDW 13.1 13.3  PLT 261 198    Cardiac Enzymes Recent Labs  Lab 03/24/17 2335 03/25/17 0500 03/25/17 0915  TROPONINI 0.05* 0.05* 0.06*    Recent Labs  Lab 03/24/17 2041  TROPIPOC 0.03     BNP Recent Labs  Lab 03/24/17 2131  BNP 190.3*     DDimer No results for input(s): DDIMER in the last 168 hours.  Radiology    Dg Chest 2 View  Result Date: 03/24/2017 CLINICAL DATA:  Chest pain. EXAM: CHEST - 2 VIEW COMPARISON:  Radiographs and CT 03/08/2017 FINDINGS: Post median sternotomy. Chronic hyperinflation and emphysema. Normal heart size and mediastinal contours. No pulmonary edema, pleural effusion or pneumothorax. No focal airspace disease. Degenerative change of both shoulders. IMPRESSION: 1. No acute abnormality. 2. Emphysema and chronic hyperinflation, unchanged. Electronically Signed   By: Jeb Levering M.D.   On: 03/24/2017 21:46    Cardiac Studies    2D echo 03/09/17 Study Conclusions  - Left ventricle: The cavity size was normal. Systolic function was   normal. The estimated ejection fraction was in the range of 60%   to 65%. Wall motion was normal; there were no regional wall   motion abnormalities. Doppler parameters are consistent with   abnormal left ventricular relaxation (grade 1 diastolic   dysfunction). - Aortic valve: Transvalvular velocity was  within the normal range.   There was no stenosis. There was no regurgitation. - Mitral valve: Transvalvular velocity was within the normal range.   There was no evidence for stenosis. There was trivial   regurgitation. - Right ventricle: The cavity size was normal. Wall thickness was   normal. Systolic function was reduced. - Tricuspid valve: There was mild regurgitation. - Pulmonary arteries: Systolic pressure was within the normal   range. PA peak pressure: 24 mm Hg (S). - Pericardium, extracardiac: A trivial pericardial effusion was   identified.  NST 03/12/17  FINDINGS: Perfusion: Large infarct involving the inferior wall including the inferior apex. Difficult to exclude peri-infarct ischemia along the apex. No other areas are concerning for reversible ischemia.  Wall Motion: Paradoxical motion along the base of the inferior wall. Hypokinesia along the septal wall which may be secondary to the CABG procedure.  Left Ventricular Ejection Fraction: 46 %  End diastolic volume 78 ml  End systolic volume 43 ml  IMPRESSION: 1. Large infarct involving the inferior wall including the inferior apex. Difficult to exclude peri-infarct ischemia at the apex and recommend correlation with ECG testing.  2. Abnormal wall motion along the inferior wall base and septal wall as described.  3. Left ventricular ejection fraction is 48%.  4. Non invasive risk stratification*: Intermediate  Patient Profile   [name] is an 84 year old man with history of coronary artery disease status post remote CABG, advanced COPD on chronic oxygen, and recent hospitalization for influenza in January. He was readmitted from the office 03/08/17 for progressive decline including increasing shortness of breath, edema, and atypical chest pain. Troponins were elevated 0.33->0.52->0.54. Echo showed normal LVEF and wall motion. He was treated for acute on chronic diastolic HF>> diuresed with IV Lasix. NST was  ordered and showed a large infarct involving the inferior wall including the inferior apex, however pt declined LHC and was treated medically. He presented back to the ED on 03/24/17 with recurrent CP, noted in consult to be sharp and atypical. See by Dr. Percival Spanish in consult, who recommended adjustment of antianginals. No further w/u planned given recent stress test previous admission, 03/12/17.    Assessment & Plan    1. Chest Pain/ CAD:  history of coronary artery disease status post remote CABG. Recent admission earlier this month w/ elevated troponins 0.33->0.52->0.54. Echo showed normal LVEF and wall motion. NST was ordered and showed a large infarct involving the inferior wall including the inferior apex, however pt declined LHC and was treated medically. He was seen by Dr. Tamala Julian  for post hospital f/u on 03/21/17 and was doing well. Dr. Tamala Julian agreed on medical management. Pt now back with recurrent CP/ angina. Now CP free. Still declining cath. Wants to treat conservatively. He is currently on ASA, statin and BB therapy, however BP a limiting factor. Dr. Percival Spanish recommended addition of Imdur, however doubt he has enough room in BP to support this. His BP prior to morning meds was 115/65. He is due to get 20 mg of Lasix and 25 mg of metoprolol now. Recommend adding Ranexa. Will discuss this with MD.   2. Chronic Diastolic HF: 2+ ankle edema on exam. Breathing at baseline. Uses supplemental O2. Continue Lasix.   3. Lipids: FLP checked this am. LDL is at goal at 60 mg/dL. Continue Lipitor.     For questions or updates, please contact [place] Please consult [email or web address] for contact info under Cardiology/STEMI.      Signed, Lyda Jester, PA-C  03/26/2017, 9:15 AM

## 2017-03-26 NOTE — Evaluation (Signed)
Physical Therapy Evaluation Patient Details Name: [name] MRN: [id number] DOB: 05-18-1929 Today's Date: 03/26/2017   History of Present Illness  84yo male presenting with chest pain, also recent COPD exacerbation secondary to flu and a stress test on 03/12/17 with large infarct inferior wall. He went home but began to have chest pain again at increasing frequency and returned to the ED. PMH CAD, COPD, HTN, hx CABG   Clinical Impression   Patient received standing at sink combing hair with wife present and assisting in line management; patient is very pleasant but requests to stay in room for PT today due to general fatigue. He is able to complete functional transfers and gait with no device with S, but is easily fatigued even walking a distance as short as 56f in the room today. SpO2 remained between 95-100% on supplemental oxygen this session. He was left sitting up in a chair with all needs met, family present this morning. He will continue to benefit from skilled PT services in the acute setting, and then will also benefit from return to skilled HHPT services to further address functional deficits moving forward.     Follow Up Recommendations Home health PT    Equipment Recommendations  Other (comment)(bedside commode )    Recommendations for Other Services       Precautions / Restrictions Precautions Precautions: Other (comment) Precaution Comments: watch SpO2  Restrictions Weight Bearing Restrictions: No      Mobility  Bed Mobility               General bed mobility comments: DNT, received standing at sink   Transfers Overall transfer level: Needs assistance Equipment used: None Transfers: Sit to/from Stand Sit to Stand: Supervision         General transfer comment: S for safety, line management   Ambulation/Gait Ambulation/Gait assistance: Supervision Ambulation Distance (Feet): 16 Feet Assistive device: None Gait Pattern/deviations: Step-through  pattern;Decreased step length - right;Decreased step length - left;Decreased stride length;Trunk flexed     General Gait Details: S for safety, line management; gait distance limited by fatigue, SpO2 remained between 95-100% on supplemental O2   Stairs            Wheelchair Mobility    Modified Rankin (Stroke Patients Only)       Balance Overall balance assessment: Independent                                           Pertinent Vitals/Pain Pain Assessment: No/denies pain    Home Living Family/patient expects to be discharged to:: Private residence Living Arrangements: Spouse/significant other Available Help at Discharge: Family;Available 24 hours/day Type of Home: House Home Access: Level entry     Home Layout: Multi-level Home Equipment: None      Prior Function Level of Independence: Independent         Comments: reports he had been doing well outside of COPD symptoms      Hand Dominance        Extremity/Trunk Assessment        Lower Extremity Assessment Lower Extremity Assessment: Overall WFL for tasks assessed    Cervical / Trunk Assessment Cervical / Trunk Assessment: Kyphotic  Communication   Communication: No difficulties  Cognition Arousal/Alertness: Awake/alert Behavior During Therapy: WFL for tasks assessed/performed Overall Cognitive Status: Within Functional Limits for tasks assessed  General Comments General comments (skin integrity, edema, etc.): able to perform SLS for 20 seconds B     Exercises     Assessment/Plan    PT Assessment Patient needs continued PT services  PT Problem List Cardiopulmonary status limiting activity;Decreased activity tolerance       PT Treatment Interventions Therapeutic activities;Gait training;Therapeutic exercise;Patient/family education;Stair training;Balance training    PT Goals (Current goals can be found in the  Care Plan section)  Acute Rehab PT Goals Patient Stated Goal: to feel better  PT Goal Formulation: With patient/family Time For Goal Achievement: 04/09/17 Potential to Achieve Goals: Fair    Frequency Min 4X/week   Barriers to discharge        Co-evaluation               AM-PAC PT "6 Clicks" Daily Activity  Outcome Measure Difficulty turning over in bed (including adjusting bedclothes, sheets and blankets)?: None Difficulty moving from lying on back to sitting on the side of the bed? : None Difficulty sitting down on and standing up from a chair with arms (e.g., wheelchair, bedside commode, etc,.)?: None Help needed moving to and from a bed to chair (including a wheelchair)?: None Help needed walking in hospital room?: A Little Help needed climbing 3-5 steps with a railing? : A Little 6 Click Score: 22    End of Session Equipment Utilized During Treatment: Oxygen Activity Tolerance: Patient limited by fatigue Patient left: in chair;with call bell/phone within reach;with family/visitor present   PT Visit Diagnosis: Difficulty in walking, not elsewhere classified (R26.2)    Time: 4715-8063 PT Time Calculation (min) (ACUTE ONLY): 12 min   Charges:   PT Evaluation $PT Eval Moderate Complexity: 1 Mod     PT G Codes:        Deniece Ree PT, DPT, CBIS  Supplemental Physical Therapist [place]   Pager [phone]

## 2017-03-26 NOTE — Progress Notes (Signed)
Patient and wife received discharge information and acknowledged understanding of it. Patient IVs were removed.  

## 2017-03-26 NOTE — Care Management Obs Status (Signed)
[place] NOTIFICATION   Patient Details  Name: [name] MRN: [id number] Date of Birth: [birth year]   Medicare Observation Status Notification Given:  Yes    Bethena Roys, RN 03/26/2017, 12:25 PM

## 2017-03-26 NOTE — Discharge Summary (Signed)
Physician Discharge Summary  [name]:[id number] DOB: 01/24/1929 DOA: 03/24/2017  PCP: [name], MD  Admit date: 03/24/2017 Discharge date: 03/26/2017  Admitted From: Home Disposition: Home Recommendations for Outpatient Follow-up:  1. Follow up with PCP in 1-2 weeks 2. Please obtain BMP/CBC in one week  Home Health: Yes Equipment/Devices: Oxygen  Discharge Condition stable CODE STATUS: Full code Diet recommendation: Cardiac diet Brief/Interim Summary:84 y.o.malePMHx HTN, CAD S/P remote CABG, and COPD with chronic hypoxic respiratory failure and steroid dependence,   Presenting to the emergency department for evaluation of chest pain. Patient reports that he had not experienced any chest pain remote CABG until late last month when, after suffering from COPD exacerbation secondary to influenza, he developed chest pain and had elevated troponin that was attributed to demand ischemia. He underwent stress test on 03/12/2017 Large infarct involving the inferior wall including the inferior apex.. He had returned to his usual state and was doing fairly well at home until developing a recurrence in his chest discomfort recently. This resolved spontaneously, but he has had increasingly frequent and more severe recurrences now, particularly for the past 1-2 hours prior to his arrival in the ED. Denies any increase in his chronic dyspnea, reports that his bilateral lower extremity swelling has been improving, and denies fevers or chills. He took 324 mg of aspirin and 1 nitroglycerin at home prior to coming in. He reports some improvement with the nitroglycerin.  ED Course:Upon arrival to the ED, patient is found to be afebrile, saturating well on room air, and with vitals otherwise normal. EKG features a sinus rhythm with possible AV nodal block and incomplete RBBB. Chest x-ray features stable COPD changes. Chemistry panel is notable for a mild hyponatremia. CBC is  unremarkable. Initial troponin is within normal limits. Patient was treated with additional nitroglycerin in the emergency department and reports significant improvement with this. He remains hemodynamically stable and will be observed in the stepdown unit for ongoing evaluation and management of chest pain with history ofCAD.    Discharge Diagnoses:  Principal Problem:   Chest pain Active Problems:   CAD (coronary artery disease)   Chronic diastolic CHF (congestive heart failure) (HCC)   COPD (chronic obstructive pulmonary disease) (HCC)   Chronic respiratory failure with hypoxia (HCC) Chronic Systolic and Diastolic CHF -remote history of CABG -although earlier this month echocardiogram showed normal EF, nuclear med stress test showed reduced EF= 48% see results below. -Unstable angina -HEART score= 6 -patient with unprovoked substernal chest pain with radiation to his left shoulder and neck relieved with nitroglycerin -NTG discontinue secondary to hypotension -cardiology recommends starting Ranexa thousand milligrams twice a day. [facility]       Recent Labs  Lab 03/24/17 2335 03/25/17 0500 03/25/17 0915  TROPONINI 0.05* 0.05* 0.06*       COPD steroid-dependent/Chronic Respiratory Failure with Hypoxia -at baseline 2 L O2 via [place] -prednisone 10 mg daily -Spiriva daily -Symbicort 160-45 micrograms BID(Dulera 200-5 micrograms instead of Symbicort) -Titrate to maintain SPO2 89-93%        Discharge Instructions  Discharge Instructions    Diet - low sodium heart healthy   Complete by:  As directed    Increase activity slowly   Complete by:  As directed      Allergies as of 03/26/2017      Reactions   Sulfa Antibiotics Anaphylaxis, Swelling   Cefdinir Other (See Comments)   Severe Diarrhea   Nitrofurantoin Swelling   Hand Swelling   Ciprofloxacin  Itching, Rash   Levaquin [levofloxacin] Itching, Rash      Medication List    TAKE these medications    ALPRAZolam 0.25 MG tablet Commonly known as:  XANAX Take 0.125-0.25 mg by mouth See admin instructions. Take one tablet (0.25mg ) three times daily and take one half tablet (0.125mg ) twice daily.   aspirin EC 81 MG tablet Take 81 mg by mouth daily.   atorvastatin 20 MG tablet Commonly known as:  LIPITOR Take 20 mg by mouth daily at 6 PM.   b complex vitamins capsule Take 1 capsule by mouth daily.   finasteride 5 MG tablet Commonly known as:  PROSCAR Take 5 mg by mouth daily.   furosemide 20 MG tablet Commonly known as:  LASIX Take 1 tablet (20 mg total) by mouth daily. Start taking on:  03/27/2017 What changed:    how much to take  when to take this  additional instructions   guaiFENesin 600 MG 12 hr tablet Commonly known as:  MUCINEX Take 1 tablet (600 mg total) by mouth 2 (two) times daily.   mometasone 50 MCG/ACT nasal spray Commonly known as:  NASONEX Place 2 sprays into the nose daily.   nitroGLYCERIN 0.4 MG SL tablet Commonly known as:  NITROSTAT Place 0.4 mg under the tongue as needed for chest pain.   OXYGEN Inhale 2 L into the lungs continuous.   oxymetazoline 0.05 % nasal spray Commonly known as:  AFRIN Place 1 spray into both nostrils 2 (two) times daily as needed for congestion.   pantoprazole 40 MG tablet Commonly known as:  PROTONIX Take 40 mg by mouth daily.   polyethylene glycol packet Commonly known as:  MIRALAX / GLYCOLAX Take 17 g by mouth 2 (two) times daily as needed for mild constipation.   predniSONE 10 MG tablet Commonly known as:  DELTASONE Take 10 mg by mouth every morning.   PRESERVISION AREDS 2 PO Take 1 capsule by mouth daily.   ranitidine 150 MG tablet Commonly known as:  ZANTAC Take 150 mg by mouth 2 (two) times daily.   ranolazine 1000 MG SR tablet Commonly known as:  RANEXA Take 1 tablet (1,000 mg total) by mouth 2 (two) times daily.   sodium chloride 2 % ophthalmic solution Commonly known as:  MURO 128 Place 1  drop into both eyes every 4 (four) hours as needed for eye irritation.   SPIRIVA RESPIMAT 2.5 MCG/ACT Aers Generic drug:  Tiotropium Bromide Monohydrate Inhale 2 puffs into the lungs daily.   SYMBICORT 160-4.5 MCG/ACT inhaler Generic drug:  budesonide-formoterol Inhale 2 puffs into the lungs every 12 (twelve) hours.   TOPROL XL 25 MG 24 hr tablet Generic drug:  metoprolol succinate Take 25 mg by mouth daily.   VITAMIN B-12 PO Take 1 tablet by mouth daily.   XOPENEX HFA 45 MCG/ACT inhaler Generic drug:  levalbuterol Inhale 2 puffs into the lungs every 4 (four) hours as needed for shortness of breath or wheezing.       Allergies  Allergen Reactions  . Sulfa Antibiotics Anaphylaxis and Swelling  . Cefdinir Other (See Comments)    Severe Diarrhea  . Nitrofurantoin Swelling    Hand Swelling  . Ciprofloxacin Itching and Rash  . Levaquin [Levofloxacin] Itching and Rash    Consultations:  cardiology   Procedures/Studies: Dg Chest 2 View  Result Date: 03/24/2017 CLINICAL DATA:  Chest pain. EXAM: CHEST - 2 VIEW COMPARISON:  Radiographs and CT 03/08/2017 FINDINGS: Post median sternotomy. Chronic hyperinflation and  emphysema. Normal heart size and mediastinal contours. No pulmonary edema, pleural effusion or pneumothorax. No focal airspace disease. Degenerative change of both shoulders. IMPRESSION: 1. No acute abnormality. 2. Emphysema and chronic hyperinflation, unchanged. Electronically Signed   By: Jeb Levering M.D.   On: 03/24/2017 21:46    (Echo, Carotid, EGD, Colonoscopy, ERCP)    Subjective:   Discharge Exam: Vitals:   03/26/17 0800 03/26/17 0900  BP: 115/65 115/65  Pulse:  85  Resp: 16   Temp: (!) 97.5 F (36.4 C)   SpO2: 96% 98%   Vitals:   03/25/17 1914 03/26/17 0349 03/26/17 0800 03/26/17 0900  BP: (!) 87/50 (!) 146/94 115/65 115/65  Pulse: 73 85  85  Resp: 15 16 16    Temp: 97.9 F (36.6 C) 97.9 F (36.6 C) (!) 97.5 F (36.4 C)   TempSrc: Oral  Oral Oral   SpO2: 99% 95% 96% 98%  Weight:  57.2 kg (126 lb 1.6 oz)    Height:        General: Pt is alert, awake, not in acute distress Cardiovascular: RRR, S1/S2 +, no rubs, no gallops Respiratory: CTA bilaterally, no wheezing, no rhonchi Abdominal: Soft, NT, ND, bowel sounds + Extremities: no edema, no cyanosis    The results of significant diagnostics from this hospitalization (including imaging, microbiology, ancillary and laboratory) are listed below for reference.     Microbiology: No results found for this or any previous visit (from the past 240 hour(s)).   Labs: BNP (last 3 results) Recent Labs    03/24/17 2131  BNP 263.7*   Basic Metabolic Panel: Recent Labs  Lab 03/24/17 2029 03/25/17 0921 03/26/17 0621  NA 133* 136 139  K 4.2 3.9 4.3  CL 92* 99* 99*  CO2 28 30 32  GLUCOSE 161* 79 86  BUN 18 13 13   CREATININE 0.99 0.91 0.97  CALCIUM 9.3 8.8* 9.0  MG  --  2.2 2.1   Liver Function Tests: No results for input(s): AST, ALT, ALKPHOS, BILITOT, PROT, ALBUMIN in the last 168 hours. No results for input(s): LIPASE, AMYLASE in the last 168 hours. No results for input(s): AMMONIA in the last 168 hours. CBC: Recent Labs  Lab 03/24/17 2029 03/26/17 0621  WBC 10.5 7.0  HGB 13.5 11.9*  HCT 42.1 35.7*  MCV 99.3 99.2  PLT 261 198   Cardiac Enzymes: Recent Labs  Lab 03/24/17 2335 03/25/17 0500 03/25/17 0915  TROPONINI 0.05* 0.05* 0.06*   BNP: Invalid input(s): POCBNP CBG: No results for input(s): GLUCAP in the last 168 hours. D-Dimer No results for input(s): DDIMER in the last 72 hours. Hgb A1c No results for input(s): HGBA1C in the last 72 hours. Lipid Profile Recent Labs    03/26/17 0621  CHOL 146  HDL 79  LDLCALC 60  TRIG 34  CHOLHDL 1.8   Thyroid function studies No results for input(s): TSH, T4TOTAL, T3FREE, THYROIDAB in the last 72 hours.  Invalid input(s): FREET3 Anemia work up No results for input(s): VITAMINB12, FOLATE,  FERRITIN, TIBC, IRON, RETICCTPCT in the last 72 hours. Urinalysis No results found for: COLORURINE, APPEARANCEUR, LABSPEC, [place], GLUCOSEU, HGBUR, BILIRUBINUR, KETONESUR, PROTEINUR, UROBILINOGEN, NITRITE, LEUKOCYTESUR Sepsis Labs Invalid input(s): PROCALCITONIN,  WBC,  LACTICIDVEN Microbiology No results found for this or any previous visit (from the past 240 hour(s)).   Time coordinating discharge: Over 30 minutes  SIGNED:   Georgette Shell, MD  [facility] 03/26/2017, 12:04 PM  If 7PM-7AM, please contact night-coverage [email or web address] Password [email or web address]

## 2017-03-26 NOTE — Evaluation (Addendum)
Occupational Therapy Evaluation Patient Details Name: [name] MRN: [id number] DOB: [birth year] Today's Date: 03/26/2017    History of Present Illness 84yo male presenting with chest pain, also recent COPD exacerbation secondary to flu and a stress test on 03/12/17 with large infarct inferior wall. He went home but began to have chest pain again at increasing frequency and returned to the ED. PMH CAD, COPD, HTN, hx CABG    Clinical Impression   PTA, pt was living with his wife and was independent with BADLs. Currently, pt performing  ADLs and functional mobility at Mod I level with increased time. Pt presenting with decreased activity tolerance with SpO2 dropping to low 90s on 2L during LB ADLs. Discussed HH therapy options and benefits of PT and OT; pt declining HHOT at this time. Answered all pt questions. Recommend dc home once medically stable per physician. All acute OT needs met and will sign off. Thank you.     Follow Up Recommendations  No OT follow up;Supervision/Assistance - 24 hour    Equipment Recommendations  None recommended by OT    Recommendations for Other Services PT consult     Precautions / Restrictions Precautions Precautions: Other (comment) Precaution Comments: watch SpO2  Restrictions Weight Bearing Restrictions: No      Mobility Bed Mobility               General bed mobility comments: At EOB upon arrival  Transfers Overall transfer level: Needs assistance Equipment used: None Transfers: Sit to/from Stand Sit to Stand: Supervision         General transfer comment: S for safety    Balance Overall balance assessment: Independent                                         ADL either performed or assessed with clinical judgement   ADL Overall ADL's : Modified independent                                       General ADL Comments: Performing ADLs and functional mobility at Mod I level with increased  time. Pt SpO2 dropping to low 90s during LB dressing. VCs for purse lip breathing; elevating to 95% on 2L     Vision Baseline Vision/History: Wears glasses Wears Glasses: At all times Patient Visual Report: No change from baseline       Perception     Praxis      Pertinent Vitals/Pain Pain Assessment: No/denies pain     Hand Dominance Right   Extremity/Trunk Assessment Upper Extremity Assessment Upper Extremity Assessment: Overall WFL for tasks assessed   Lower Extremity Assessment Lower Extremity Assessment: Overall WFL for tasks assessed   Cervical / Trunk Assessment Cervical / Trunk Assessment: Kyphotic   Communication Communication Communication: No difficulties   Cognition Arousal/Alertness: Awake/alert Behavior During Therapy: WFL for tasks assessed/performed Overall Cognitive Status: Within Functional Limits for tasks assessed                                     General Comments  Wife present throughout session. Discussed HHOT options.    Exercises     Shoulder Instructions      Home Living Family/patient expects to be  discharged to:: Private residence Living Arrangements: Spouse/significant other Available Help at Discharge: Family;Available 24 hours/day Type of Home: House Home Access: Level entry     Home Layout: Multi-level Alternate Level Stairs-Number of Steps: 3 level with 6 steps each  Alternate Level Stairs-Rails: Right Bathroom Shower/Tub: Tub/shower unit;Walk-in Psychologist, prison and probation services: Standard     Home Equipment: Shower seat   Additional Comments: O2 2L      Prior Functioning/Environment Level of Independence: Independent        Comments: reports he had been doing well outside of COPD symptoms         OT Problem List: Decreased activity tolerance;Impaired balance (sitting and/or standing);Decreased knowledge of use of DME or AE;Decreased knowledge of precautions      OT Treatment/Interventions:       OT Goals(Current goals can be found in the care plan section) Acute Rehab OT Goals Patient Stated Goal: to feel better  OT Goal Formulation: All assessment and education complete, DC therapy  OT Frequency:     Barriers to D/C:            Co-evaluation              AM-PAC PT "6 Clicks" Daily Activity     Outcome Measure Help from another person eating meals?: None Help from another person taking care of personal grooming?: None Help from another person toileting, which includes using toliet, bedpan, or urinal?: None Help from another person bathing (including washing, rinsing, drying)?: None Help from another person to put on and taking off regular upper body clothing?: None Help from another person to put on and taking off regular lower body clothing?: None 6 Click Score: 24   End of Session Equipment Utilized During Treatment: Oxygen Nurse Communication: Mobility status  Activity Tolerance: Patient tolerated treatment well Patient left: in bed;with call bell/phone within reach;with family/visitor present;with nursing/sitter in room  OT Visit Diagnosis: Unsteadiness on feet (R26.81)                Time: 8022-1798 OT Time Calculation (min): 13 min Charges:  OT General Charges $OT Visit: 1 Visit OT Evaluation $OT Eval Low Complexity: 1 Low G-Codes:     [place], OTR/L Acute Rehab Pager: [phone] Office: [place] 03/26/2017, 4:49 PM

## 2017-03-27 ENCOUNTER — Telehealth: Payer: Self-pay | Admitting: Interventional Cardiology

## 2017-03-27 ENCOUNTER — Ambulatory Visit: Payer: Self-pay | Admitting: *Deleted

## 2017-03-27 ENCOUNTER — Telehealth: Payer: Self-pay | Admitting: Internal Medicine

## 2017-03-27 DIAGNOSIS — I10 Essential (primary) hypertension: Secondary | ICD-10-CM | POA: Diagnosis not present

## 2017-03-27 DIAGNOSIS — J9612 Chronic respiratory failure with hypercapnia: Secondary | ICD-10-CM | POA: Diagnosis not present

## 2017-03-27 DIAGNOSIS — J441 Chronic obstructive pulmonary disease with (acute) exacerbation: Secondary | ICD-10-CM | POA: Diagnosis not present

## 2017-03-27 DIAGNOSIS — J9611 Chronic respiratory failure with hypoxia: Secondary | ICD-10-CM | POA: Diagnosis not present

## 2017-03-27 DIAGNOSIS — J09X2 Influenza due to identified novel influenza A virus with other respiratory manifestations: Secondary | ICD-10-CM | POA: Diagnosis not present

## 2017-03-27 DIAGNOSIS — I2510 Atherosclerotic heart disease of native coronary artery without angina pectoris: Secondary | ICD-10-CM | POA: Diagnosis not present

## 2017-03-27 MED ORDER — AMOXICILLIN 250 MG PO CAPS: 250.0000 mg | ORAL_CAPSULE | Freq: Three times a day (TID) | ORAL | 0 refills | Status: DC

## 2017-03-27 NOTE — Telephone Encounter (Signed)
Amoxicillin 250 tid x 5 days

## 2017-03-27 NOTE — Telephone Encounter (Signed)
Pt c/o BP issue: STAT if pt c/o blurred vision, one-sided weakness or slurred speech  1. What are your last 5 BP readings? 73/45, 87/48,120/65,87/54, 75/46  2. Are you having any other symptoms (ex. Dizziness, headache, blurred vision, passed out)?Mild Chest Discomfort ,   3. What is your BP issue? Low bp reading

## 2017-03-27 NOTE — Telephone Encounter (Signed)
Spoke with Chong Sicilian, [facility] nurse.  She was calling to see if Dr. Tamala Julian was agreeable to sign orders for pt to have nursing to come in 2x this week, 1x next week and then 1x a week, every other week after that.  They will be doing general nursing care.  Advised I would send message to Dr. Tamala Julian for review.

## 2017-03-27 NOTE — Telephone Encounter (Signed)
Spoke with Patty and she advised that pt may need an abx because he is coughing up green mucus. She denies fever and states he has been in and out of the ER. She needs orders to keep seeing him because she is trying to keep him out of the hospital. MW please advise. DOD please advise for MW.       Patient Instructions by Tanda Rockers, MD at 03/22/2017 11:30 AM   Author: Tanda Rockers, MD Author Type: Physician Filed: 03/22/2017 12:09 PM  Note Status: Signed Cosign: Cosign Not Required Encounter Date: 03/22/2017  Editor: Tanda Rockers, MD (Physician)    Afrin x 3 days each side then switch sides always chase with nasonex  See Tammy NP w/in 4 weeks with all your medications, even over the counter meds, separated in two separate bags, the ones you take no matter what vs the ones you stop once you feel better and take only as needed when you feel you need them.   Tammy  will generate for you a new user friendly medication calendar that will put [place] all on the same page re: your medication use.     Without this process, it simply isn't possible to assure that we are providing  your outpatient care  with  the attention to detail we feel you deserve.   If we cannot assure that you're getting that kind of care,  then we cannot manage your problem effectively from this clinic.  Once you have seen Tammy and we are sure that we're all on the same page with your medication use she will arrange follow up with me.

## 2017-03-27 NOTE — Telephone Encounter (Signed)
This should be approved by primary care

## 2017-03-27 NOTE — Telephone Encounter (Signed)
New Message:   Patty calling from [facility] to get some orders for this patient.

## 2017-03-27 NOTE — Telephone Encounter (Signed)
Decrease metoprolol to 12.5 mg daily.  If blood pressure remains low, the medication should be discontinued.

## 2017-03-27 NOTE — Telephone Encounter (Signed)
Pt went to ED Saturday and was there until Sunday d/t low BP and chest discomfort.  Pt came home yesterday.  BP's are as follows: 03/26/17 7:30pm- 73/45 (dizzy and gait difficulty) 8pm- 120/65 (mild chest discomfort, laying down with feet up) 9pm- 87/54  03/27/17 7am- 75/46 9am- 122/67 (in bed, laying down)  Pt has not taken any medications today.  Having mild dizziness right now.  Advised pt to lay down and prop feet up when BP low.  Pt currently scheduled to see [name], NP 4/24.  Pt asked about possibly having Fluconazole for when BP is low.  Advised I will send message to Dr. Tamala Julian for review and advisement on pt's medications.  Hospital d/c has pt on Lasix 20 QD, Metoprolol Succ 25 QD, and Ranexa 1000 BID.

## 2017-03-27 NOTE — Telephone Encounter (Signed)
Called pt and advised message from the provider. Pt understood and verbalized understanding. Nothing further is needed.    Rx sent in. 

## 2017-03-28 ENCOUNTER — Other Ambulatory Visit: Payer: Self-pay | Admitting: *Deleted

## 2017-03-28 DIAGNOSIS — J9611 Chronic respiratory failure with hypoxia: Secondary | ICD-10-CM | POA: Diagnosis not present

## 2017-03-28 DIAGNOSIS — J9612 Chronic respiratory failure with hypercapnia: Secondary | ICD-10-CM | POA: Diagnosis not present

## 2017-03-28 DIAGNOSIS — I2510 Atherosclerotic heart disease of native coronary artery without angina pectoris: Secondary | ICD-10-CM | POA: Diagnosis not present

## 2017-03-28 DIAGNOSIS — I10 Essential (primary) hypertension: Secondary | ICD-10-CM | POA: Diagnosis not present

## 2017-03-28 DIAGNOSIS — J441 Chronic obstructive pulmonary disease with (acute) exacerbation: Secondary | ICD-10-CM | POA: Diagnosis not present

## 2017-03-28 DIAGNOSIS — J09X2 Influenza due to identified novel influenza A virus with other respiratory manifestations: Secondary | ICD-10-CM | POA: Diagnosis not present

## 2017-03-28 MED ORDER — METOPROLOL SUCCINATE ER 25 MG PO TB24: 12.5000 mg | ORAL_TABLET | Freq: Every day | ORAL | 3 refills | Status: DC

## 2017-03-28 NOTE — Telephone Encounter (Signed)
Spoke with Patty and made her aware.  She then asked if Dr. Tamala Julian would like for them to implement CHF protocol with pt.  With this they send a kit of IV Lasix, po Lasix and po Metolazone and have a protocol that they follow if pt were to gain wt.  They use these items based off of their particular protocol.  They also need to know if pt has any fluid restrictions?  Will route to Dr. Tamala Julian for review and advisement.

## 2017-03-28 NOTE — Telephone Encounter (Signed)
Follow up     Patient is returning [place] call

## 2017-03-28 NOTE — Telephone Encounter (Signed)
F/U Call:  Patty states patient HR is 95. Patient BP is 156/80. Patient has a little more swelling in LE.  Patient did not take metoprolol or lasix yesterday, patient take 1/2 tablet of metoprolol today. Patient has not picked up Ranexa.

## 2017-03-28 NOTE — Telephone Encounter (Signed)
Spoke with pt and he wanted to make sure he was to take the Ranexa.  Advised this is correct.  Advised if BP remains low to contact the office and let us know.  Pt verbalized understanding and was in agreement with this plan.

## 2017-03-28 NOTE — Telephone Encounter (Signed)
Spoke with wife, DPR on file.  Advised her of recommendations per Dr. Tamala Julian.  She verbalized understanding.  Wife states pt is still in bed and had another question but she couldn't remember what it was.  She will have pt contact me once he gets up.

## 2017-03-28 NOTE — Telephone Encounter (Signed)
Pt's call returned.  Please see other phone note.  This note is in regards to [place] questions for Dr. Tamala Julian.

## 2017-03-28 NOTE — Patient Outreach (Addendum)
[facility]) Care Management  03/28/2017  [name] 15-Jun-1929 [id number]    Transition of care (Initial-d/c 3/18)  RN completed a transition of care call today and template based upon an admission from 3/16-3/18 for unstable angina (no additional pain or discomfort related to his recent event have occurred since returning home). Pt home in the care of her wife Alben Deeds) and as resumed [facility] for PT services however pt states he has been to weak "not strong enough" to work with the agency this week on top of a conflict one day where he had an appointment. Pt states he will resume services next week with gentle exercises and continue to build his strength. Pt confirms he is attending all his appointments and has spoken with his provider (CAD) office who made some to his medications. RN review all medications and confirmed pt's understanding. Plan of care discussed and goals adjusted accordingly with noted interventions under the current diagnosis.Note last two admission related to CAD issues with NSTEMMI and chest pain. Care plan will reflect education and prevention measures related.  Will offered again further engagement however pt feels he will continue to progress with [facility] PT next week and continue to be careful with his mobility with no assisted devices uses at this time. Pt refuses indicating he does use anything at this time.  RN inquired if equipment was needed or suggested by the PT encouraged pt to use however pt declined indicating he uses very safe techniques to prevent falls and injuries from occurring. Will continue transition of calls and follow up next week.  Patient was recently discharged from hospital and all medications have been reviewed.  [name], RN Care Management Coordinator [place] Office [phone]

## 2017-03-29 DIAGNOSIS — F419 Anxiety disorder, unspecified: Secondary | ICD-10-CM | POA: Diagnosis not present

## 2017-03-29 DIAGNOSIS — I998 Other disorder of circulatory system: Secondary | ICD-10-CM | POA: Diagnosis not present

## 2017-03-29 DIAGNOSIS — J449 Chronic obstructive pulmonary disease, unspecified: Secondary | ICD-10-CM | POA: Diagnosis not present

## 2017-03-29 DIAGNOSIS — F324 Major depressive disorder, single episode, in partial remission: Secondary | ICD-10-CM | POA: Diagnosis not present

## 2017-03-29 DIAGNOSIS — I10 Essential (primary) hypertension: Secondary | ICD-10-CM | POA: Diagnosis not present

## 2017-03-29 NOTE — Telephone Encounter (Signed)
Spoke with Dr. Tamala Julian yesterday and he said No CHF protocol and 1.5L/day of fluids. In regards to last call about vitals/swelling from nurse, Pt needs to get back on his medications and see if swelling improves since he did miss medications for a day.  Advised Patty of information.  She verbalized understanding.  She asked that I speak with pt as well.  Spoke with pt and went over recommendations.  Pt was able to pick up Ranexa yesterday and will start that today.  Pt appreciative for call.

## 2017-03-30 ENCOUNTER — Telehealth: Payer: Self-pay | Admitting: Internal Medicine

## 2017-03-30 NOTE — Telephone Encounter (Signed)
atc Patty at [facility] X2, line rang to fast busy signal.  Wcb.

## 2017-04-02 ENCOUNTER — Other Ambulatory Visit: Payer: Self-pay | Admitting: *Deleted

## 2017-04-02 DIAGNOSIS — J9612 Chronic respiratory failure with hypercapnia: Secondary | ICD-10-CM | POA: Diagnosis not present

## 2017-04-02 DIAGNOSIS — I10 Essential (primary) hypertension: Secondary | ICD-10-CM | POA: Diagnosis not present

## 2017-04-02 DIAGNOSIS — J441 Chronic obstructive pulmonary disease with (acute) exacerbation: Secondary | ICD-10-CM | POA: Diagnosis not present

## 2017-04-02 DIAGNOSIS — I2510 Atherosclerotic heart disease of native coronary artery without angina pectoris: Secondary | ICD-10-CM | POA: Diagnosis not present

## 2017-04-02 DIAGNOSIS — J9611 Chronic respiratory failure with hypoxia: Secondary | ICD-10-CM | POA: Diagnosis not present

## 2017-04-02 DIAGNOSIS — J09X2 Influenza due to identified novel influenza A virus with other respiratory manifestations: Secondary | ICD-10-CM | POA: Diagnosis not present

## 2017-04-02 NOTE — Telephone Encounter (Signed)
ATC Patty. Received a message stating that the "party you are trying to reach is unavailable". Will close this message since this was the 3rd attempt.

## 2017-04-02 NOTE — Patient Outreach (Signed)
[facility]) Care Management  04/02/2017  [name] May 23, 1929 [id number]    Transition of care  RN spoke with pt today and inquired on pt's ongoing management of care. Pt states he is taking all there requested medications ordered with no delays. States some side affects from the antibiotics but tolerable and he feels much better with no additional symptoms. Pt also states his weight remains the same with added fluid but he continue and has always had a history of lower legs swelling but states "it's much better since his left he hospital". States he is moving slowly but will work with the PT from [facility] this week as he continues to make progress. Reports a visit with his primary provider last week and will not follow up again until 3 months.  Plan of care discussed with updated noted along with interventions adjusted according to pt's progress. Will continue to offer community resources to assist with his ongoing progress however none needed at this time. Pt continues to be receptive to ongoing transition of care with the next scheduled call from next week. No other request or inquires at this time. Will follow up accordingly.  [name], RN Care Management Coordinator [place] Office [phone]

## 2017-04-03 DIAGNOSIS — I10 Essential (primary) hypertension: Secondary | ICD-10-CM | POA: Diagnosis not present

## 2017-04-03 DIAGNOSIS — J9611 Chronic respiratory failure with hypoxia: Secondary | ICD-10-CM | POA: Diagnosis not present

## 2017-04-03 DIAGNOSIS — J441 Chronic obstructive pulmonary disease with (acute) exacerbation: Secondary | ICD-10-CM | POA: Diagnosis not present

## 2017-04-03 DIAGNOSIS — J09X2 Influenza due to identified novel influenza A virus with other respiratory manifestations: Secondary | ICD-10-CM | POA: Diagnosis not present

## 2017-04-03 DIAGNOSIS — I6789 Other cerebrovascular disease: Secondary | ICD-10-CM | POA: Diagnosis not present

## 2017-04-03 DIAGNOSIS — R269 Unspecified abnormalities of gait and mobility: Secondary | ICD-10-CM | POA: Diagnosis not present

## 2017-04-03 DIAGNOSIS — I2510 Atherosclerotic heart disease of native coronary artery without angina pectoris: Secondary | ICD-10-CM | POA: Diagnosis not present

## 2017-04-03 DIAGNOSIS — J9612 Chronic respiratory failure with hypercapnia: Secondary | ICD-10-CM | POA: Diagnosis not present

## 2017-04-04 ENCOUNTER — Other Ambulatory Visit: Payer: Self-pay

## 2017-04-04 ENCOUNTER — Observation Stay (HOSPITAL_COMMUNITY)
Admission: EM | Admit: 2017-04-04 | Discharge: 2017-04-05 | Disposition: A | Discharge status: Home / Self Care | Payer: Medicare Other | Attending: Internal Medicine | Admitting: Internal Medicine

## 2017-04-04 ENCOUNTER — Observation Stay (HOSPITAL_COMMUNITY): Payer: Medicare Other

## 2017-04-04 ENCOUNTER — Emergency Department (HOSPITAL_COMMUNITY): Payer: Medicare Other

## 2017-04-04 ENCOUNTER — Encounter (HOSPITAL_COMMUNITY): Payer: Self-pay | Admitting: Emergency Medicine

## 2017-04-04 DIAGNOSIS — R531 Weakness: Secondary | ICD-10-CM | POA: Diagnosis not present

## 2017-04-04 DIAGNOSIS — I25119 Atherosclerotic heart disease of native coronary artery with unspecified angina pectoris: Secondary | ICD-10-CM | POA: Insufficient documentation

## 2017-04-04 DIAGNOSIS — I25709 Atherosclerosis of coronary artery bypass graft(s), unspecified, with unspecified angina pectoris: Secondary | ICD-10-CM | POA: Diagnosis present

## 2017-04-04 DIAGNOSIS — Z951 Presence of aortocoronary bypass graft: Secondary | ICD-10-CM | POA: Diagnosis not present

## 2017-04-04 DIAGNOSIS — Y658 Other specified misadventures during surgical and medical care: Secondary | ICD-10-CM | POA: Diagnosis not present

## 2017-04-04 DIAGNOSIS — R269 Unspecified abnormalities of gait and mobility: Secondary | ICD-10-CM | POA: Diagnosis not present

## 2017-04-04 DIAGNOSIS — T50905A Adverse effect of unspecified drugs, medicaments and biological substances, initial encounter: Secondary | ICD-10-CM | POA: Diagnosis not present

## 2017-04-04 DIAGNOSIS — K219 Gastro-esophageal reflux disease without esophagitis: Secondary | ICD-10-CM | POA: Diagnosis present

## 2017-04-04 DIAGNOSIS — I110 Hypertensive heart disease with heart failure: Secondary | ICD-10-CM | POA: Insufficient documentation

## 2017-04-04 DIAGNOSIS — R278 Other lack of coordination: Secondary | ICD-10-CM | POA: Diagnosis not present

## 2017-04-04 DIAGNOSIS — T50995A Adverse effect of other drugs, medicaments and biological substances, initial encounter: Secondary | ICD-10-CM | POA: Diagnosis not present

## 2017-04-04 DIAGNOSIS — J45909 Unspecified asthma, uncomplicated: Secondary | ICD-10-CM | POA: Diagnosis not present

## 2017-04-04 DIAGNOSIS — I10 Essential (primary) hypertension: Secondary | ICD-10-CM | POA: Diagnosis present

## 2017-04-04 DIAGNOSIS — I5032 Chronic diastolic (congestive) heart failure: Secondary | ICD-10-CM | POA: Insufficient documentation

## 2017-04-04 DIAGNOSIS — J9611 Chronic respiratory failure with hypoxia: Secondary | ICD-10-CM | POA: Diagnosis present

## 2017-04-04 DIAGNOSIS — R2681 Unsteadiness on feet: Secondary | ICD-10-CM | POA: Diagnosis not present

## 2017-04-04 DIAGNOSIS — Z79899 Other long term (current) drug therapy: Secondary | ICD-10-CM | POA: Diagnosis not present

## 2017-04-04 DIAGNOSIS — I739 Peripheral vascular disease, unspecified: Secondary | ICD-10-CM | POA: Diagnosis present

## 2017-04-04 DIAGNOSIS — E785 Hyperlipidemia, unspecified: Secondary | ICD-10-CM | POA: Insufficient documentation

## 2017-04-04 DIAGNOSIS — R4781 Slurred speech: Secondary | ICD-10-CM | POA: Diagnosis not present

## 2017-04-04 DIAGNOSIS — T887XXA Unspecified adverse effect of drug or medicament, initial encounter: Secondary | ICD-10-CM | POA: Insufficient documentation

## 2017-04-04 DIAGNOSIS — R479 Unspecified speech disturbances: Secondary | ICD-10-CM | POA: Diagnosis not present

## 2017-04-04 DIAGNOSIS — F419 Anxiety disorder, unspecified: Secondary | ICD-10-CM | POA: Diagnosis present

## 2017-04-04 DIAGNOSIS — J449 Chronic obstructive pulmonary disease, unspecified: Secondary | ICD-10-CM | POA: Diagnosis not present

## 2017-04-04 DIAGNOSIS — I2510 Atherosclerotic heart disease of native coronary artery without angina pectoris: Secondary | ICD-10-CM | POA: Diagnosis present

## 2017-04-04 DIAGNOSIS — R0602 Shortness of breath: Secondary | ICD-10-CM | POA: Diagnosis not present

## 2017-04-04 DIAGNOSIS — R251 Tremor, unspecified: Secondary | ICD-10-CM | POA: Diagnosis not present

## 2017-04-04 HISTORY — DX: Gastro-esophageal reflux disease without esophagitis: K21.9

## 2017-04-04 HISTORY — DX: Unspecified osteoarthritis, unspecified site: M19.90

## 2017-04-04 HISTORY — DX: Pneumonia, unspecified organism: J18.9

## 2017-04-04 HISTORY — DX: Personal history of other medical treatment: Z92.89

## 2017-04-04 HISTORY — DX: Unspecified diastolic (congestive) heart failure: I50.30

## 2017-04-04 HISTORY — DX: Basal cell carcinoma of skin, unspecified: C44.91

## 2017-04-04 HISTORY — DX: Personal history of urinary calculi: Z87.442

## 2017-04-04 LAB — I-STAT CHEM 8, ED
BUN: 20 mg/dL (ref 6–20)
Calcium, Ion: 1.09 mmol/L — ABNORMAL LOW (ref 1.15–1.40)
Chloride: 93 mmol/L — ABNORMAL LOW (ref 101–111)
Creatinine, Ser: 1.2 mg/dL (ref 0.61–1.24)
Glucose, Bld: 121 mg/dL — ABNORMAL HIGH (ref 65–99)
HCT: 37 % — ABNORMAL LOW (ref 39.0–52.0)
Hemoglobin: 12.6 g/dL — ABNORMAL LOW (ref 13.0–17.0)
Potassium: 4.3 mmol/L (ref 3.5–5.1)
Sodium: 134 mmol/L — ABNORMAL LOW (ref 135–145)
TCO2: 32 mmol/L (ref 22–32)

## 2017-04-04 LAB — CBC
HCT: 38.4 % — ABNORMAL LOW (ref 39.0–52.0)
HCT: 35.6 % — ABNORMAL LOW (ref 39.0–52.0)
Hemoglobin: 12.7 g/dL — ABNORMAL LOW (ref 13.0–17.0)
Hemoglobin: 11.9 g/dL — ABNORMAL LOW (ref 13.0–17.0)
MCH: 32 pg (ref 26.0–34.0)
MCH: 32.6 pg (ref 26.0–34.0)
MCHC: 33.1 g/dL (ref 30.0–36.0)
MCHC: 33.4 g/dL (ref 30.0–36.0)
MCV: 96.7 fL (ref 78.0–100.0)
MCV: 97.5 fL (ref 78.0–100.0)
Platelets: 212 10*3/uL (ref 150–400)
Platelets: 195 10*3/uL (ref 150–400)
RBC: 3.97 MIL/uL — ABNORMAL LOW (ref 4.22–5.81)
RBC: 3.65 MIL/uL — ABNORMAL LOW (ref 4.22–5.81)
RDW: 12.5 % (ref 11.5–15.5)
RDW: 13 % (ref 11.5–15.5)
WBC: 9.4 10*3/uL (ref 4.0–10.5)
WBC: 10.4 10*3/uL (ref 4.0–10.5)

## 2017-04-04 LAB — COMPREHENSIVE METABOLIC PANEL
ALT: 20 U/L (ref 17–63)
AST: 27 U/L (ref 15–41)
Albumin: 4 g/dL (ref 3.5–5.0)
Alkaline Phosphatase: 61 U/L (ref 38–126)
Anion gap: 10 (ref 5–15)
BUN: 16 mg/dL (ref 6–20)
CO2: 31 mmol/L (ref 22–32)
Calcium: 9.4 mg/dL (ref 8.9–10.3)
Chloride: 94 mmol/L — ABNORMAL LOW (ref 101–111)
Creatinine, Ser: 1.21 mg/dL (ref 0.61–1.24)
GFR calc Af Amer: >60 mL/min (ref >60)
GFR calc non Af Amer: 53 mL/min — ABNORMAL LOW (ref >60)
Glucose, Bld: 120 mg/dL — ABNORMAL HIGH (ref 65–99)
Potassium: 4.3 mmol/L (ref 3.5–5.1)
Sodium: 135 mmol/L (ref 135–145)
Total Bilirubin: 0.8 mg/dL (ref 0.3–1.2)
Total Protein: 6 g/dL — ABNORMAL LOW (ref 6.5–8.1)

## 2017-04-04 LAB — DIFFERENTIAL
Basophils Absolute: 0 10*3/uL (ref 0.0–0.1)
Basophils Relative: 0 %
Eosinophils Absolute: 0 10*3/uL (ref 0.0–0.7)
Eosinophils Relative: 0 %
Lymphocytes Relative: 5 %
Lymphs Abs: 0.5 10*3/uL — ABNORMAL LOW (ref 0.7–4.0)
Monocytes Absolute: 0.7 10*3/uL (ref 0.1–1.0)
Monocytes Relative: 7 %
Neutro Abs: 8.2 10*3/uL — ABNORMAL HIGH (ref 1.7–7.7)
Neutrophils Relative %: 88 %

## 2017-04-04 LAB — RAPID URINE DRUG SCREEN, HOSP PERFORMED
Amphetamines: NOT DETECTED
Barbiturates: NOT DETECTED
Benzodiazepines: POSITIVE — AB
Cocaine: NOT DETECTED
Opiates: NOT DETECTED
Tetrahydrocannabinol: NOT DETECTED

## 2017-04-04 LAB — BLOOD GAS, ARTERIAL
Acid-Base Excess: 6.3 mmol/L — ABNORMAL HIGH (ref 0.0–2.0)
Bicarbonate: 30.9 mmol/L — ABNORMAL HIGH (ref 20.0–28.0)
Drawn by: 511471
O2 Content: 2 L/min
O2 Saturation: 97.6 %
Patient temperature: 98.6
pCO2 arterial: 49.1 mmHg — ABNORMAL HIGH (ref 32.0–48.0)
pH, Arterial: 7.415 (ref 7.350–7.450)
pO2, Arterial: 93.3 mmHg (ref 83.0–108.0)

## 2017-04-04 LAB — AMMONIA: Ammonia: 24 umol/L (ref 9–35)

## 2017-04-04 LAB — URINALYSIS, ROUTINE W REFLEX MICROSCOPIC
Bilirubin Urine: NEGATIVE
Glucose, UA: NEGATIVE mg/dL
Hgb urine dipstick: NEGATIVE
Ketones, ur: NEGATIVE mg/dL
Leukocytes, UA: NEGATIVE
Nitrite: NEGATIVE
Protein, ur: NEGATIVE mg/dL
Specific Gravity, Urine: 1.012 (ref 1.005–1.030)
pH: 6 (ref 5.0–8.0)

## 2017-04-04 LAB — APTT: aPTT: 29 seconds (ref 24–36)

## 2017-04-04 LAB — PROTIME-INR
INR: 1.06
Prothrombin Time: 13.8 seconds (ref 11.4–15.2)

## 2017-04-04 LAB — I-STAT TROPONIN, ED: Troponin i, poc: 0.03 ng/mL (ref 0.00–0.08)

## 2017-04-04 LAB — MAGNESIUM: Magnesium: 2.4 mg/dL (ref 1.7–2.4)

## 2017-04-04 LAB — CREATININE, SERUM
Creatinine, Ser: 1.23 mg/dL (ref 0.61–1.24)
GFR calc Af Amer: 60 mL/min — ABNORMAL LOW (ref >60)
GFR calc non Af Amer: 52 mL/min — ABNORMAL LOW (ref >60)

## 2017-04-04 LAB — GLUCOSE, CAPILLARY: Glucose-Capillary: 117 mg/dL — ABNORMAL HIGH (ref 65–99)

## 2017-04-04 LAB — TROPONIN I
Troponin I: 0.03 ng/mL (ref <0.03)
Troponin I: 0.03 ng/mL (ref <0.03)

## 2017-04-04 LAB — ETHANOL: Alcohol, Ethyl (B): <10 mg/dL (ref <10)

## 2017-04-04 LAB — CBG MONITORING, ED: Glucose-Capillary: 117 mg/dL — ABNORMAL HIGH (ref 65–99)

## 2017-04-04 MED ORDER — ENSURE ENLIVE PO LIQD
237.0000 mL | Freq: Two times a day (BID) | ORAL | Status: DC
  Administered 2017-04-05 (×2): 237 mL via ORAL

## 2017-04-04 MED ORDER — MOMETASONE FURO-FORMOTEROL FUM 200-5 MCG/ACT IN AERO
2.0000 | INHALATION_SPRAY | Freq: Two times a day (BID) | RESPIRATORY_TRACT | Status: DC
Start: 2017-04-04 — End: 2017-04-05
  Administered 2017-04-04: 2 via RESPIRATORY_TRACT
  Filled 2017-04-04: qty 8.8

## 2017-04-04 MED ORDER — ACETAMINOPHEN 650 MG RE SUPP
650.0000 mg | Freq: Four times a day (QID) | RECTAL | Status: DC | PRN
Start: 2017-04-04 — End: 2017-04-05

## 2017-04-04 MED ORDER — LORAZEPAM 2 MG/ML IJ SOLN: 1.0000 mg | Freq: Once | INTRAMUSCULAR | Status: DC

## 2017-04-04 MED ORDER — FUROSEMIDE 20 MG PO TABS
20.0000 mg | ORAL_TABLET | Freq: Every day | ORAL | Status: DC
  Administered 2017-04-04 – 2017-04-05 (×2): 20 mg via ORAL
  Filled 2017-04-04 (×2): qty 1

## 2017-04-04 MED ORDER — POLYETHYLENE GLYCOL 3350 17 G PO PACK: 17.0000 g | PACK | Freq: Every day | ORAL | Status: DC | PRN

## 2017-04-04 MED ORDER — PREDNISONE 5 MG PO TABS
10.0000 mg | ORAL_TABLET | Freq: Every day | ORAL | Status: DC
  Administered 2017-04-04 – 2017-04-05 (×2): 10 mg via ORAL
  Filled 2017-04-04: qty 1
  Filled 2017-04-04: qty 2

## 2017-04-04 MED ORDER — FLUTICASONE PROPIONATE 50 MCG/ACT NA SUSP: 1.0000 | Freq: Every day | NASAL | Status: DC

## 2017-04-04 MED ORDER — SODIUM CHLORIDE 0.9 % IV SOLN
INTRAVENOUS | Status: DC
  Administered 2017-04-04: 17:00:00 via INTRAVENOUS

## 2017-04-04 MED ORDER — ONDANSETRON HCL 4 MG/2ML IJ SOLN: 4.0000 mg | Freq: Four times a day (QID) | INTRAMUSCULAR | Status: DC | PRN

## 2017-04-04 MED ORDER — ONDANSETRON HCL 4 MG PO TABS: 4.0000 mg | ORAL_TABLET | Freq: Four times a day (QID) | ORAL | Status: DC | PRN

## 2017-04-04 MED ORDER — ATORVASTATIN CALCIUM 10 MG PO TABS
20.0000 mg | ORAL_TABLET | Freq: Every day | ORAL | Status: DC
  Administered 2017-04-04: 20 mg via ORAL
  Filled 2017-04-04: qty 2
  Filled 2017-04-04: qty 1

## 2017-04-04 MED ORDER — METOPROLOL SUCCINATE ER 25 MG PO TB24
12.5000 mg | ORAL_TABLET | Freq: Every day | ORAL | Status: DC
  Administered 2017-04-04 – 2017-04-05 (×2): 12.5 mg via ORAL
  Filled 2017-04-04 (×2): qty 1

## 2017-04-04 MED ORDER — ACETAMINOPHEN 325 MG PO TABS: 650.0000 mg | ORAL_TABLET | Freq: Four times a day (QID) | ORAL | Status: DC | PRN

## 2017-04-04 MED ORDER — ASPIRIN 81 MG PO CHEW
81.0000 mg | CHEWABLE_TABLET | Freq: Every day | ORAL | Status: DC
  Administered 2017-04-04 – 2017-04-05 (×2): 81 mg via ORAL
  Filled 2017-04-04 (×2): qty 1

## 2017-04-04 MED ORDER — FAMOTIDINE 10 MG PO TABS
10.0000 mg | ORAL_TABLET | Freq: Two times a day (BID) | ORAL | Status: DC
  Administered 2017-04-04 – 2017-04-05 (×2): 10 mg via ORAL
  Filled 2017-04-04 (×2): qty 1

## 2017-04-04 MED ORDER — FINASTERIDE 5 MG PO TABS
5.0000 mg | ORAL_TABLET | Freq: Every morning | ORAL | Status: DC
  Administered 2017-04-04 – 2017-04-05 (×2): 5 mg via ORAL
  Filled 2017-04-04 (×2): qty 1

## 2017-04-04 MED ORDER — TIOTROPIUM BROMIDE MONOHYDRATE 18 MCG IN CAPS
1.0000 | ORAL_CAPSULE | Freq: Every day | RESPIRATORY_TRACT | Status: DC
  Administered 2017-04-04: 18 ug via RESPIRATORY_TRACT
  Filled 2017-04-04: qty 5

## 2017-04-04 MED ORDER — PANTOPRAZOLE SODIUM 40 MG PO TBEC: 40.0000 mg | DELAYED_RELEASE_TABLET | Freq: Every day | ORAL | Status: DC

## 2017-04-04 MED ORDER — ALPRAZOLAM 0.25 MG PO TABS
0.1250 mg | ORAL_TABLET | Freq: Three times a day (TID) | ORAL | Status: DC
  Administered 2017-04-04 – 2017-04-05 (×4): 0.125 mg via ORAL
  Filled 2017-04-04 (×4): qty 1

## 2017-04-04 MED ORDER — ENOXAPARIN SODIUM 40 MG/0.4ML ~~LOC~~ SOLN
40.0000 mg | SUBCUTANEOUS | Status: DC
Start: 2017-04-04 — End: 2017-04-05
  Administered 2017-04-04: 40 mg via SUBCUTANEOUS
  Filled 2017-04-04 (×2): qty 0.4

## 2017-04-04 NOTE — ED Notes (Signed)
Dr. Cheral Marker cancelled Code Stroke status on pt.

## 2017-04-04 NOTE — Progress Notes (Signed)
EEG completed; results pending.    

## 2017-04-04 NOTE — Progress Notes (Signed)
Patient seen and examine. Please see Dr. Yvetta Coder note from this morning. Weakness and tremulousness are concerning for orthostasis, deconditioning in the setting of recent influenza infection, and possible hypercarbia with history of COPD. No focal deficits on exam, CT head negative. CVA unlikely. Recommend checking orthostatics, PT/OT. Will follow.   Velna Ochs, M.D. - PGY2 04/04/2017, 10:55 AM

## 2017-04-04 NOTE — ED Notes (Addendum)
MRI to get patient and EEG to get after lunch

## 2017-04-04 NOTE — ED Notes (Signed)
EEG being done at bedside at this time

## 2017-04-04 NOTE — H&P (Signed)
History and Physical    [name] JWJ:[id number] DOB: October 22, 1929 DOA: 04/04/2017  PCP: [name], MD Consultants:  Neurology Patient coming from: Home - lives with wife ; NOK: wife [name] [phone]  Chief Complaint: weakness, trembling  HPI: [name] is a 84 y.o. male with medical history significant of COPD with chronic hypoxic respiratory failure on home O2 and chronic steroids, CAD with remote CABG, carotid artery occlusion, hyperlipidemia, htn, PVD. Chronic systolic and diastolic HF per nuc med stress test 03/2017 (EF 48%) Pt was admitted 03/24/17 through 03/26/17 for work up of chest pain felt secondary to unstable angina. He was put on Ranexa per cardiology recs. Chart review shows cardiology (Dr Tamala Julian) reduced his metoprolol dose to 12.5mg  daily on 03/27/17 because pt called office due to dizziness and low BPs. The same day pulmonologist put pt on Amoxicillin bc [facility] nurse called office stating pt coughing up green sputum. Pt states symptoms have resolved and he completed antibiotic course yesterday. He did have some loose stool with antibiotic, but that has since resolved.   Pt presents today after waking up with bilateral LE weakness and inability to walk.  He went to bed at 2300 in normal state and awoke at 2350 with symptoms. Wife states pt unable to stand due to "uncontrolled trembling" of both legs. She denies slurred speech, but says speech is "thick because his mouth is so dry". He was ambulating without assistance prior to arrival and improving with PT. She also states several episodes of low BP yesterday ("80's, 90's") as they check it at home frequently.Pt denies recent headache, dizziness, chest pain, change in breathing, abdominal pain, n/v. LE swelling is improved on Lasix.    ED Course: Pt initially arrived as code stroke in the ED, which was canceled by neurology. Lab workup including CBC, CMET, NH3, Mg, trop poc unremarkable. VSS. CT head shows age appropriate  findings with nothing acute.   Review of Systems: As per HPI; otherwise review of systems reviewed and negative.   Ambulatory Status: Ambulates without assistance prior to admit  Past Medical History:  Diagnosis Date  . Asthma   . BPH (benign prostatic hyperplasia)    severe; s/p multiple biopsies  . CAD (coronary artery disease)   . Carotid artery occlusion   . Chronic respiratory failure ([place])   . Chronic rhinitis   . COPD (chronic obstructive pulmonary disease) (HCC)    2L [place] O2  . COPD (chronic obstructive pulmonary disease) ([place])   . Elevated troponin 03/09/2017  . HTN (hypertension)   . Hyperlipidemia   . Hypertension   . On home oxygen therapy    "~ 24/7" (02/08/2017)  . Peripheral vascular disease ([place])   . Syncope and collapse   . Ureteral tumor 08/2015   had endoscopic procedure for evaluation, unable to reach for biopsy    Past Surgical History:  Procedure Laterality Date  . CATARACT EXTRACTION  11-20-11  . CORONARY ARTERY BYPASS GRAFT  10/2001   LIMA to LAD, SVG to OM1-2, SVG to RCA and PDA  . CORONARY ARTERY BYPASS GRAFT    . LITHOTRIPSY    . PROSTATE BIOPSY  Oct. 2014    Social History   Socioeconomic History  . Marital status: Married    Spouse name: Not on file  . Number of children: 2  . Years of education: Not on file  . Highest education level: Not on file  Occupational History  . 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 Financial resource strain: Not on file  . Food insecurity:    Worry: Not on file    Inability: Not on file  . Transportation needs:    Medical: Not on file    Non-medical: Not on file  Tobacco Use  . Smoking status: Former Smoker    Packs/day: 2.00    Years: 33.00    Pack years: 66.00    Types: Cigarettes    Last attempt to quit: 03/25/1982    Years since quitting: 35.0  . Smokeless tobacco: Former Network engineer and Sexual Activity  . Alcohol use: Yes    Alcohol/week:  0.0 oz    Comment: 1.5-3 oz daily in the past, minimal use in the last few months  . Drug use: No  . Sexual activity: Not on file  Lifestyle  . Physical activity:    Days per week: Not on file    Minutes per session: Not on file  . Stress: Not on file  Relationships  . Social connections:    Talks on phone: Not on file    Gets together: Not on file    Attends religious service: Not on file    Active member of club or organization: Not on file    Attends meetings of clubs or organizations: Not on file    Relationship status: Not on file  . Intimate partner violence:    Fear of current or ex partner: Not on file    Emotionally abused: Not on file    Physically abused: Not on file    Forced sexual activity: Not on file  Other Topics Concern  . Not on file  Social History Narrative   ** Merged History Encounter **       Pt lives at home with his spouse.          Allergies  Allergen Reactions  . Sulfa Antibiotics Anaphylaxis and Swelling  . Sulfonamide Derivatives Swelling    REACTION: facial/tongue swelling  . Cefdinir Diarrhea    REACTION: diarrhea  . Ciprofloxacin Itching and Rash    Red itchy hands  . Nitrofurantoin Swelling    Swollen hands  . Cefdinir Other (See Comments)    Severe Diarrhea  . Nitrofurantoin Swelling    Hand Swelling  . Ciprofloxacin Itching and Rash  . Levaquin [Levofloxacin] Itching and Rash  . Levaquin [Levofloxacin] Itching and Rash    Family History  Problem Relation Age of Onset  . Heart disease Mother   . Heart disease Father        Before age 19  . Breast cancer Mother   . Cancer Mother        Breast cancer  . Hypertension Mother   . Other Mother        AAA  and   Amputation  . Heart attack Father   . Stroke Brother        x3, still living   . Peripheral vascular disease Brother   . Heart disease Brother   . Hyperlipidemia Brother   . Hypertension Brother   . Heart disease Brother   . Allergies Brother     Prior to  Admission medications   Medication Sig Start Date End Date Taking? Authorizing Provider  ALPRAZolam (XANAX) 0.25 MG tablet Take half (1/2) tablet by mouth three (3) times daily.    [provider]  ALPRAZolam Duanne Moron) 0.25 MG tablet Take 0.125-0.25 mg by mouth See admin instructions.  Take one tablet (0.25mg ) three times daily and take one half tablet (0.125mg ) twice daily.    [provider]  amoxicillin (AMOXIL) 250 MG capsule Take 1 capsule (250 mg total) by mouth 3 (three) times daily. 03/27/17   Juanito Doom, MD  aspirin 81 MG tablet Take 81 mg by mouth daily.    [provider]  aspirin EC 81 MG tablet Take 81 mg by mouth daily.    [provider]  atorvastatin (LIPITOR) 20 MG tablet TAKE 1 TABLET BY MOUTH EVERY DAY Patient taking differently: TAKE 1 TABLET (20mg ) BY MOUTH EVERY DAY 05/24/16   Belva Crome, MD  atorvastatin (LIPITOR) 20 MG tablet Take 20 mg by mouth daily at 6 PM. 03/21/17   [provider]  b complex vitamins capsule Take 1 capsule by mouth daily.    [provider]  Cholecalciferol (VITAMIN D3) 2000 units TABS Take 2,000 Units by mouth daily.    [provider]  Cyanocobalamin (VITAMIN B 12 PO) Take 1 tablet daily by mouth.     [provider]  Cyanocobalamin (VITAMIN B-12 PO) Take 1 tablet by mouth daily.    [provider]  finasteride (PROSCAR) 5 MG tablet Take 5 mg by mouth every morning.    [provider]  finasteride (PROSCAR) 5 MG tablet Take 5 mg by mouth daily. 03/05/17   [provider]  furosemide (LASIX) 20 MG tablet Take 1 tablet (20 mg total) by mouth daily. Can take one additional pill if needed for swelling. Patient not taking: Reported on 03/28/2017 02/27/17   Tanda Rockers, MD  furosemide (LASIX) 20 MG tablet Take 1 tablet (20 mg total) by mouth daily. 03/27/17   Georgette Shell, MD  guaiFENesin (MUCINEX) 600 MG 12 hr tablet Take 600 mg by mouth 2  (two) times daily as needed for cough.     [provider]  guaiFENesin (MUCINEX) 600 MG 12 hr tablet Take 1 tablet (600 mg total) by mouth 2 (two) times daily. 03/26/17 03/26/18  Georgette Shell, MD  metoprolol succinate (TOPROL-XL) 25 MG 24 hr tablet Take 0.5 tablets (12.5 mg total) by mouth daily. 03/28/17   Belva Crome, MD  mometasone (NASONEX) 50 MCG/ACT nasal spray SHAKE LIQUID AND USE 2 SPRAYS IN [facility] NOSTRIL DAILY 12/25/16   Tanda Rockers, MD  mometasone (NASONEX) 50 MCG/ACT nasal spray Place 2 sprays into the nose daily. 03/05/17   [provider]  Multiple Vitamins-Minerals (PRESERVISION AREDS 2 PO) Take 1 capsule by mouth daily.    [provider]  Multiple Vitamins-Minerals (PRESERVISION AREDS 2) CAPS Take 1 capsule by mouth daily.    [provider]  nitroGLYCERIN (NITROSTAT) 0.4 MG SL tablet DISSOLVE 1 TABLET UNDER THE TONGUE AS DIRECTED AND AS NEEDED FOR CHEST PAIN Patient not taking: Reported on 03/28/2017 03/02/17   Belva Crome, MD  nitroGLYCERIN (NITROSTAT) 0.4 MG SL tablet Place 0.4 mg under the tongue as needed for chest pain. 03/02/17   [provider]  OXYGEN Inhale 2 L continuous into the lungs.     [provider]  OXYGEN Inhale 2 L into the lungs continuous.    [provider]  oxymetazoline (AFRIN) 0.05 % nasal spray Place 1 spray into both nostrils 2 (two) times daily as needed for congestion. 03/26/17   Georgette Shell, MD  pantoprazole (PROTONIX) 40 MG tablet Take 40 mg by mouth daily. 02/20/17   [provider]  polyethylene glycol (MIRALAX / GLYCOLAX) packet Take 17 g by mouth daily as needed for mild constipation.    [provider]  polyethylene glycol (MIRALAX / GLYCOLAX) packet Take 17 g by mouth 2 (two) times daily as needed for mild constipation.    [provider]  predniSONE (DELTASONE) 10 MG tablet Take 10 mg by mouth daily with breakfast.    [provider]  predniSONE (DELTASONE) 10 MG tablet Take 10 mg by mouth every morning. 03/21/17   [provider]  ranitidine (ZANTAC) 150 MG tablet Take 150 mg by mouth 2 (two) times daily.    [provider]  ranitidine (ZANTAC) 150 MG tablet Take 150 mg by mouth 2 (two) times daily. 03/21/17   [provider]  ranolazine (RANEXA) 1000 MG SR tablet Take 1 tablet (1,000 mg total) by mouth 2 (two) times daily. 03/26/17   Georgette Shell, MD  sodium chloride (MURO 128) 2 % ophthalmic solution Place 1 drop into both eyes every 4 (four) hours as needed for eye irritation.    [provider]  Sodium Chloride, Hypertonic, (MURO 128 OP) Place 1 drop 4 (four) times daily into both eyes.    [provider]  SPIRIVA RESPIMAT 2.5 MCG/ACT AERS INHALE 2 PUFFS BY MOUTH EVERY DAY 10/05/16   Tanda Rockers, MD  SPIRIVA RESPIMAT 2.5 MCG/ACT AERS Inhale 2 puffs into the lungs daily. 03/05/17   [provider]  SYMBICORT 160-4.5 MCG/ACT inhaler INHALE 2 PUFFS BY MOUTH EVERY MORNING AND THEN ANOTHER 2 PUFFS ABOUT 12 HOURS LATER 11/15/16   Tanda Rockers, MD  SYMBICORT 160-4.5 MCG/ACT inhaler Inhale 2 puffs into the lungs every 12 (twelve) hours. 03/05/17   [provider]  XOPENEX HFA 45 MCG/ACT inhaler INHALE 2 PUFFS BY MOUTH EVERY 4 HOURS AS NEEDED FOR WHEEZING OR SHORTNESS OF BREATH 10/05/16   Tanda Rockers, MD  XOPENEX HFA 45 MCG/ACT inhaler Inhale 2 puffs into the lungs every 4 (four) hours as needed for shortness of breath or wheezing. 03/21/17   [provider]    Physical Exam: Vitals:   04/04/17 0545 04/04/17 0600 04/04/17 0700 04/04/17 0715  BP: (!) 163/81 (!) 159/81 (!) 151/82 129/71  Pulse: 72 70 67 68  Resp: 19 14 13 19   Temp:      TempSrc:      SpO2: 100% 100% 100% 98%  Weight:      Height:         General: frail appearing, lying supine in bed in NAD Eyes: PERRL, EOMI, normal lids, iris ENT: HOH, lips & tongue, mmm; appropriate  dentition Neck: no LAD, masses or thyromegaly; no carotid bruits Cardiovascular: S1S2 RRR, no m/r/g. 1+ ankle and pedal edema bilaterally  Respiratory:  Diminished throughout, but CTA bilaterally with no wheezes/rales/rhonchi.  Normal respiratory effort. Abdomen: soft, NT, ND, NABS Back:   normal alignment, no CVAT Skin: no rash or induration seen on limited exam Musculoskeletal:  grossly normal tone BUE/BLE, good ROM, no bony abnormality Lower extremity: Limited foot exam with no ulcerations.  2+ distal pulses. Psychiatric: grossly normal mood and affect, speech fluent and appropriate, AOx3 Neurologic: CN 2-12 grossly intact, moves all extremities in coordinated fashion, sensation intact. 4/5 strength in upper and lower extremities. On my exam, tremor did not become more pronounced with resistance. Speech does seem garbled. DTR's symmetric throughout. Gait was not tested.     Radiological Exams on Admission: Ct Head Code Stroke Wo Contrast  Result Date: 04/04/2017 CLINICAL DATA:  Code stroke. 84 year old male with speech deficit and decreased coordination. Last seen normal 2300 hours. EXAM: CT HEAD WITHOUT CONTRAST TECHNIQUE: Contiguous axial images were obtained from the base of the skull through the vertex without intravenous contrast. COMPARISON:   Imaging head CT without contrast 02/02/2016. Brain MRI and intracranial MRA 07/13/2012. FINDINGS: Brain: No acute intracranial hemorrhage identified. No midline shift, mass effect, or evidence of intracranial mass lesion. No ventriculomegaly. Cerebral volume and gray-white matter differentiation are stable and within normal limits for age. No cortically based acute infarct identified. No cortical encephalomalacia identified. Vascular: Calcified atherosclerosis at the skull base. No suspicious intracranial vascular hyperdensity. Skull: No acute osseous abnormality identified. Sinuses/Orbits: Trace fluid in the sphenoid sinuses, otherwise  stable and well pneumatized. Other: No acute orbit or scalp soft tissue finding. ASPECTS ([place] Stroke Program Early CT Score) - Ganglionic level infarction (caudate, lentiform nuclei, internal capsule, insula, M1-M3 cortex): 7 - Supraganglionic infarction (M4-M6 cortex): 3 Total score (0-10 with 10 being normal): 10 IMPRESSION: 1. Stable and normal for age noncontrast CT appearance of the brain. 2. ASPECTS is 10. 3. These results were communicated to Dr. Cheral Marker at 1:17 amon 3/27/2019by text page via the [facility] messaging system. Electronically Signed   By: Genevie Ann M.D.   On: 04/04/2017 01:18    EKG: Independently reviewed.  NSR with rate 76; nonspecific ST changes with no evidence of acute ischemia, RBBB. No significant change from previous ekg.    Labs on Admission: I have personally reviewed the available labs and imaging studies at the time of the admission.  Pertinent labs:  WBC 9.4 (ANC 8.2) Hgb 12.7 (MCV normal) CMET wnl Mg 2.4 NH3 24 U/a pending PT/INR pending TSH 1.8 on 03/08/17   Assessment/Plan Principal Problem:   Weakness Active Problems:   Hyperlipidemia   Essential hypertension   Chronic obstructive pulmonary disease (HCC)   Coronary artery disease involving coronary bypass graft of native heart with angina pectoris (HCC)   Peripheral vascular disease (HCC)   GERD (gastroesophageal reflux disease)   Anxiety   CAD (coronary artery disease)   Chronic respiratory failure with hypoxia (HCC)    Weakness and Tremors -unclear etiology. Neurology with concern for asterixis. No metabolic or toxic source identified as w/u normal thus far including CBC, CMET, NH3, Mg. Pt afebrile, VSS. ABG is pending. TSH normal earlier this month. Tremor actually seems improved on admission than on arrival to ED based on notes though gait not tested.  -.CT head unremarkable. Speech is somewhat garbled on exam. He did not pass bedside swallow eval though wife things this was positional. Will  ask RN to repeat to see if SLP necessary. -check MRI, check EEG -Consider medication side effect vs transient hypotension based on recent home readings. BB dose was decreased by cardiology last week due to low home readings.  -Monitor on telemetry, check orthostatics, u/a, cycle cardiac enzymes given hx, chest xray, PT eval  Hx CAD, remote CABG -no cardiac complaints -s/p nuc med stress test on 03/10/17 showing large inferior wall infarct  -put on Ranexa during recent hospitalization d/t angina. Tremors is not listed as a side effect  Hx hypertension -normal BP readings in ED. Wife reports low readings (80's, 90's) at home prompting Metoprolol dose to be decreased to 12.5mg  last week. -monitor inpt. Check orthostatics. -continue BB for now as no evidence of hypotension on admit  Anemia, mild -chronic dz -Monitor  O2 dep COPD with chronic respiratory failure -  stable on exam. Checking ABG with above issues -continue chronic prednisone, Spiriva, Symbicort  Chronic diastolic and systolic HF -EF 00% per nuc med stress test on 03/12/17 -appears stable on exam.  -continue home Lasix  Anxiety -stable on admit -continue Xanax     DVT prophylaxis: Lovenox  Code Status: Full - confirmed with patient/family Family Communication: wife at bedside on admit Disposition Plan: Home once clinically improved Consults called: neurology Admission status: observation  Patrici Ranks, NP-C [place]  pgr [phone]   If note is complete, please contact covering daytime or nighttime physician. [email or web address] Password [facility]  04/04/2017, 8:44 AM

## 2017-04-04 NOTE — ED Notes (Signed)
Patient unable to ambulate , unsteady with legs tremors/shaking when he tried to stand up.

## 2017-04-04 NOTE — ED Triage Notes (Addendum)
Patient arrived with EMS from home went to bed at 2300 , spouse reported that he woke up 2350 with slurred speech/stuttering and unsteady gait with arms and legs tremors , cleared by EDP at arrival and transported to CT scan with neurologist/RN .

## 2017-04-04 NOTE — Progress Notes (Signed)
[facility]  Patient Status: Active (receiving services up to time of hospitalization)  [facility] is providing the following services: RN and PT  If patient discharges after hours, please call [phone].   [name] 04/04/2017, 5:16 PM

## 2017-04-04 NOTE — ED Notes (Signed)
Pt remains at studies

## 2017-04-04 NOTE — ED Provider Notes (Signed)
[facility] EMERGENCY DEPARTMENT Provider Note   CSN: [id number] Arrival date & time: 04/04/17  4665   An emergency department physician performed an initial assessment on this suspected stroke patient at 0050.  History   Chief Complaint Chief Complaint  Patient presents with  . Slurred Speech / Unsteady Gait    HPI [name] is a 84 y.o. male.  HPI  84 year old male comes in with chief complaint of weakness and unsteady gait. Patient arrives as code stroke.  Patient has history of CAD, COPD, and a recent admission for influenza. Patient was having angina and was started on Ranexa.  Patient states that yesterday he started noticing some tremors.  Overnight when he tried to get up he had "jumping" of both of his legs and he was unable to get up at all.  Patient also had left-sided twitching.  EMS was called because of balance issues.  Patient arrived as code stroke which was canceled by neurology.  Patient denies any associated numbness, tingling, vision changes, dizziness or lightheadedness at rest.  Patient's twitching returns when he tries to get up or when he is pushing with his upper extremities.  Past Medical History:  Diagnosis Date  . Asthma   . BPH (benign prostatic hyperplasia)    severe; s/p multiple biopsies  . CAD (coronary artery disease)   . Carotid artery occlusion   . Chronic respiratory failure ([place])   . Chronic rhinitis   . COPD (chronic obstructive pulmonary disease) (HCC)    2L [place] O2  . COPD (chronic obstructive pulmonary disease) ([place])   . Elevated troponin 03/09/2017  . HTN (hypertension)   . Hyperlipidemia   . Hypertension   . On home oxygen therapy    "~ 24/7" (02/08/2017)  . Peripheral vascular disease ([place])   . Syncope and collapse   . Ureteral tumor 08/2015   had endoscopic procedure for evaluation, unable to reach for biopsy    Patient Active Problem List   Diagnosis Date Noted  . Weakness 04/04/2017  . Chest pain  03/24/2017  . AV block, [place] 1 03/24/2017  . CAD (coronary artery disease) 03/24/2017  . Chronic diastolic CHF (congestive heart failure) ([place]) 03/24/2017  . COPD (chronic obstructive pulmonary disease) ([place]) 03/24/2017  . Chronic respiratory failure with hypoxia ([place]) 03/24/2017  . NSTEMI (non-ST elevated myocardial infarction) ([place])   . Elevated troponin 03/09/2017  . Acute on chronic diastolic heart failure ([place])   . Tachycardia 03/08/2017  . Influenza A 02/04/2017  . Cor pulmonale, chronic (HCC)/ clinical dx  09/13/2016  . Orthostatic hypotension 09/26/2015  . Angina at rest [facility]) 09/24/2015  . BPH (benign prostatic hyperplasia) 09/13/2015  . GERD (gastroesophageal reflux disease) 09/13/2015  . Anxiety 09/13/2015  . Sinusitis, chronic 09/09/2015  . Dyspnea 04/15/2015  . Acute heart failure ([place]) 03/18/2015  . Right calf pain 06/18/2014  . Coronary artery disease involving coronary bypass graft of native heart with angina pectoris ([name])   . Peripheral vascular disease ([place])   . Essential hypertension   . Obstructive sleep apnea   . Rhinitis, chronic 01/29/2007  . Chronic obstructive pulmonary disease ([place]) 01/29/2007  . Chronic respiratory failure with hypoxia and hypercapnia ([place]) 01/29/2007  . Hyperlipidemia     Past Surgical History:  Procedure Laterality Date  . CATARACT EXTRACTION  11-20-11  . CORONARY ARTERY BYPASS GRAFT  10/2001   LIMA to LAD, SVG to OM1-2, SVG to RCA and PDA  . CORONARY ARTERY BYPASS GRAFT    .  LITHOTRIPSY    . PROSTATE BIOPSY  Oct. 2014        Home Medications    Prior to Admission medications   Medication Sig Start Date End Date Taking? Authorizing Provider  ALPRAZolam (XANAX) 0.25 MG tablet Take half (1/2) tablet by mouth three (3) times daily.    [provider]  ALPRAZolam Duanne Moron) 0.25 MG tablet Take 0.125-0.25 mg by mouth See admin instructions. Take one tablet (0.25mg ) three times daily and take one half tablet (0.125mg )  twice daily.    [provider]  amoxicillin (AMOXIL) 250 MG capsule Take 1 capsule (250 mg total) by mouth 3 (three) times daily. 03/27/17   Juanito Doom, MD  aspirin 81 MG tablet Take 81 mg by mouth daily.    [provider]  aspirin EC 81 MG tablet Take 81 mg by mouth daily.    [provider]  atorvastatin (LIPITOR) 20 MG tablet TAKE 1 TABLET BY MOUTH EVERY DAY Patient taking differently: TAKE 1 TABLET (20mg ) BY MOUTH EVERY DAY 05/24/16   Belva Crome, MD  atorvastatin (LIPITOR) 20 MG tablet Take 20 mg by mouth daily at 6 PM. 03/21/17   [provider]  b complex vitamins capsule Take 1 capsule by mouth daily.    [provider]  Cholecalciferol (VITAMIN D3) 2000 units TABS Take 2,000 Units by mouth daily.    [provider]  Cyanocobalamin (VITAMIN B 12 PO) Take 1 tablet daily by mouth.     [provider]  Cyanocobalamin (VITAMIN B-12 PO) Take 1 tablet by mouth daily.    [provider]  finasteride (PROSCAR) 5 MG tablet Take 5 mg by mouth every morning.    [provider]  finasteride (PROSCAR) 5 MG tablet Take 5 mg by mouth daily. 03/05/17   [provider]  furosemide (LASIX) 20 MG tablet Take 1 tablet (20 mg total) by mouth daily. Can take one additional pill if needed for swelling. Patient not taking: Reported on 03/28/2017 02/27/17   Tanda Rockers, MD  furosemide (LASIX) 20 MG tablet Take 1 tablet (20 mg total) by mouth daily. 03/27/17   Georgette Shell, MD  guaiFENesin (MUCINEX) 600 MG 12 hr tablet Take 600 mg by mouth 2 (two) times daily as needed for cough.     [provider]  guaiFENesin (MUCINEX) 600 MG 12 hr tablet Take 1 tablet (600 mg total) by mouth 2 (two) times daily. 03/26/17 03/26/18  Georgette Shell, MD  metoprolol succinate (TOPROL-XL) 25 MG 24 hr tablet Take 0.5 tablets (12.5 mg total) by mouth daily. 03/28/17   Belva Crome, MD  mometasone (NASONEX) 50  MCG/ACT nasal spray SHAKE LIQUID AND USE 2 SPRAYS IN [facility] NOSTRIL DAILY 12/25/16   Tanda Rockers, MD  mometasone (NASONEX) 50 MCG/ACT nasal spray Place 2 sprays into the nose daily. 03/05/17   [provider]  Multiple Vitamins-Minerals (PRESERVISION AREDS 2 PO) Take 1 capsule by mouth daily.    [provider]  Multiple Vitamins-Minerals (PRESERVISION AREDS 2) CAPS Take 1 capsule by mouth daily.    [provider]  nitroGLYCERIN (NITROSTAT) 0.4 MG SL tablet DISSOLVE 1 TABLET UNDER THE TONGUE AS DIRECTED AND AS NEEDED FOR CHEST PAIN Patient not taking: Reported on 03/28/2017 03/02/17   Belva Crome, MD  nitroGLYCERIN (NITROSTAT) 0.4 MG SL tablet Place 0.4 mg under the tongue as needed for chest pain. 03/02/17   [provider]  OXYGEN Inhale 2  L continuous into the lungs.     [provider]  OXYGEN Inhale 2 L into the lungs continuous.    [provider]  oxymetazoline (AFRIN) 0.05 % nasal spray Place 1 spray into both nostrils 2 (two) times daily as needed for congestion. 03/26/17   Georgette Shell, MD  pantoprazole (PROTONIX) 40 MG tablet Take 40 mg by mouth daily. 02/20/17   [provider]  polyethylene glycol (MIRALAX / GLYCOLAX) packet Take 17 g by mouth daily as needed for mild constipation.    [provider]  polyethylene glycol (MIRALAX / GLYCOLAX) packet Take 17 g by mouth 2 (two) times daily as needed for mild constipation.    [provider]  predniSONE (DELTASONE) 10 MG tablet Take 10 mg by mouth daily with breakfast.    [provider]  predniSONE (DELTASONE) 10 MG tablet Take 10 mg by mouth every morning. 03/21/17   [provider]  ranitidine (ZANTAC) 150 MG tablet Take 150 mg by mouth 2 (two) times daily.    [provider]  ranitidine (ZANTAC) 150 MG tablet Take 150 mg by mouth 2 (two) times daily. 03/21/17   [provider]  ranolazine (RANEXA) 1000 MG SR tablet  Take 1 tablet (1,000 mg total) by mouth 2 (two) times daily. 03/26/17   Georgette Shell, MD  sodium chloride (MURO 128) 2 % ophthalmic solution Place 1 drop into both eyes every 4 (four) hours as needed for eye irritation.    [provider]  Sodium Chloride, Hypertonic, (MURO 128 OP) Place 1 drop 4 (four) times daily into both eyes.    [provider]  SPIRIVA RESPIMAT 2.5 MCG/ACT AERS INHALE 2 PUFFS BY MOUTH EVERY DAY 10/05/16   Tanda Rockers, MD  SPIRIVA RESPIMAT 2.5 MCG/ACT AERS Inhale 2 puffs into the lungs daily. 03/05/17   [provider]  SYMBICORT 160-4.5 MCG/ACT inhaler INHALE 2 PUFFS BY MOUTH EVERY MORNING AND THEN ANOTHER 2 PUFFS ABOUT 12 HOURS LATER 11/15/16   Tanda Rockers, MD  SYMBICORT 160-4.5 MCG/ACT inhaler Inhale 2 puffs into the lungs every 12 (twelve) hours. 03/05/17   [provider]  XOPENEX HFA 45 MCG/ACT inhaler INHALE 2 PUFFS BY MOUTH EVERY 4 HOURS AS NEEDED FOR WHEEZING OR SHORTNESS OF BREATH 10/05/16   Tanda Rockers, MD  XOPENEX HFA 45 MCG/ACT inhaler Inhale 2 puffs into the lungs every 4 (four) hours as needed for shortness of breath or wheezing. 03/21/17   [provider]    Family History Family History  Problem Relation Age of Onset  . Heart disease Mother   . Heart disease Father        Before age 58  . Breast cancer Mother   . Cancer Mother        Breast cancer  . Hypertension Mother   . Other Mother        AAA  and   Amputation  . Heart attack Father   . Stroke Brother        x3, still living   . Peripheral vascular disease Brother   . Heart disease Brother   . Hyperlipidemia Brother   . Hypertension Brother   . Heart disease Brother   . Allergies Brother     Social History Social History   Tobacco Use  . Smoking status: Former Smoker    Packs/day: 2.00    Years: 33.00    Pack years: 66.00    Types: Cigarettes  Last attempt to quit: 03/25/1982    Years since quitting: 35.0  . Smokeless  tobacco: Former Network engineer Use Topics  . Alcohol use: Yes    Alcohol/week: 0.0 oz    Comment: 1.5-3 oz daily in the past, minimal use in the last few months  . Drug use: No     Allergies   Sulfa antibiotics; Sulfonamide derivatives; Cefdinir; Ciprofloxacin; Nitrofurantoin; Cefdinir; Nitrofurantoin; Ciprofloxacin; Levaquin [levofloxacin]; and Levaquin [levofloxacin]   Review of Systems Review of Systems  Constitutional: Positive for activity change.  Allergic/Immunologic: Negative for immunocompromised state.  Neurological: Positive for tremors. Negative for seizures and headaches.  Hematological: Does not bruise/bleed easily.  All other systems reviewed and are negative.    Physical Exam Updated Vital Signs BP (!) 159/81   Pulse 70   Temp 98.4 F (36.9 C)   Resp 14   Ht 5\' 9"  (1.753 m)   Wt 59 kg (130 lb 1.1 oz)   SpO2 100%   BMI 19.21 kg/m   Physical Exam  Constitutional: He is oriented to person, place, and time. He appears well-developed.  HENT:  Head: Atraumatic.  Neck: Neck supple.  Cardiovascular: Normal rate.  Pulmonary/Chest: Effort normal.  Abdominal: Soft.  Neurological: He is alert and oriented to person, place, and time. No cranial nerve deficit. Coordination normal.  Cerebellar exam is normal (finger to nose) Sensory exam normal for bilateral upper and lower extremities - and patient is able to discriminate between sharp and dull. Motor exam is 4+/5  Unable to ambulate, unsteady  Skin: Skin is warm.  Nursing note and vitals reviewed.    ED Treatments / Results  Labs (all labs ordered are listed, but only abnormal results are displayed) Labs Reviewed  CBC - Abnormal; Notable for the following components:      Result Value   RBC 3.97 (*)    Hemoglobin 12.7 (*)    HCT 38.4 (*)    All other components within normal limits  DIFFERENTIAL - Abnormal; Notable for the following components:   Neutro Abs 8.2 (*)    Lymphs Abs 0.5 (*)    All  other components within normal limits  COMPREHENSIVE METABOLIC PANEL - Abnormal; Notable for the following components:   Chloride 94 (*)    Glucose, Bld 120 (*)    Total Protein 6.0 (*)    GFR calc non Af Amer 53 (*)    All other components within normal limits  I-STAT CHEM 8, ED - Abnormal; Notable for the following components:   Sodium 134 (*)    Chloride 93 (*)    Glucose, Bld 121 (*)    Calcium, Ion 1.09 (*)    Hemoglobin 12.6 (*)    HCT 37.0 (*)    All other components within normal limits  CBG MONITORING, ED - Abnormal; Notable for the following components:   Glucose-Capillary 117 (*)    All other components within normal limits  ETHANOL  PROTIME-INR  APTT  MAGNESIUM  AMMONIA  RAPID URINE DRUG SCREEN, HOSP PERFORMED  URINALYSIS, ROUTINE W REFLEX MICROSCOPIC  I-STAT TROPONIN, ED    EKG EKG Interpretation  Date/Time:  Wednesday April 04 2017 01:17:28 EDT Ventricular Rate:  76 PR Interval:    QRS Duration: 166 QT Interval:  441 QTC Calculation: 496 R Axis:   170 Text Interpretation:  Sinus rhythm Right bundle branch block No acute changes Nonspecific ST and T wave abnormality No significant change since last tracing Confirmed by Kathrynn Humble, Myran Arcia [phone]) on  04/04/2017 2:48:21 AM   Radiology Ct Head Code Stroke Wo Contrast  Result Date: 04/04/2017 CLINICAL DATA:  Code stroke. 84 year old male with speech deficit and decreased coordination. Last seen normal 2300 hours. EXAM: CT HEAD WITHOUT CONTRAST TECHNIQUE: Contiguous axial images were obtained from the base of the skull through the vertex without intravenous contrast. COMPARISON:  [facility] head CT without contrast 02/02/2016. Brain MRI and intracranial MRA 07/13/2012. FINDINGS: Brain: No acute intracranial hemorrhage identified. No midline shift, mass effect, or evidence of intracranial mass lesion. No ventriculomegaly. Cerebral volume and gray-white matter differentiation are stable and within normal limits  for age. No cortically based acute infarct identified. No cortical encephalomalacia identified. Vascular: Calcified atherosclerosis at the skull base. No suspicious intracranial vascular hyperdensity. Skull: No acute osseous abnormality identified. Sinuses/Orbits: Trace fluid in the sphenoid sinuses, otherwise stable and well pneumatized. Other: No acute orbit or scalp soft tissue finding. ASPECTS ([place] Stroke Program Early CT Score) - Ganglionic level infarction (caudate, lentiform nuclei, internal capsule, insula, M1-M3 cortex): 7 - Supraganglionic infarction (M4-M6 cortex): 3 Total score (0-10 with 10 being normal): 10 IMPRESSION: 1. Stable and normal for age noncontrast CT appearance of the brain. 2. ASPECTS is 10. 3. These results were communicated to Dr. Cheral Marker at 1:17 amon 3/27/2019by text page via the [facility] messaging system. Electronically Signed   By: Genevie Ann M.D.   On: 04/04/2017 01:18    Procedures Procedures (including critical care time)  Medications Ordered in ED Medications - No data to display   Initial Impression / Assessment and Plan / ED Course  I have reviewed the triage vital signs and the nursing notes.  Pertinent labs & imaging results that were available during my care of the patient were reviewed by me and considered in my medical decision making (see chart for details).     84 year old male comes in with chief complaint of tremors and inability to ambulate.  Patient has history of CAD/COPD and was recently admitted for influenza.  I agree with the neurology assessment that this is unlikely to be stroke given bilateral lower extremity shaking.  Patient denies any back pain.  His neurologic exam is nonfocal.  Patient's tremor is reproduced when he is trying to push away with his upper extremities against resistance.  The same thing occurs when he tries to get up.  Neurology thinks that patient might have asterixis, however this does not seem clinically asterixis to me.   Metabolic workup in the ER is normal.  During his admission he developed angina and was started on Ranexa.  Side effect profile for this medication reveals tremors as a side effect.  I suspect that the medication is likely contributing.  I spoke with Dr. Odis Hollingshead, cardiology fellow who does not have significant knowledge about the side effect profile, however states that it is safe to discontinue the medication as it is used mostly for symptom control than adjuvant therapy only.  We are unable to ambulate the patient.  He will be admitted for PT OT and social work evaluation.   Final Clinical Impressions(s) / ED Diagnoses   Final diagnoses:  Medication side effect, initial encounter    ED Discharge Orders    None       Varney Biles, MD 04/04/17 [phone]

## 2017-04-04 NOTE — ED Notes (Addendum)
Patient arrived at CT scan with RN and neurologist.

## 2017-04-04 NOTE — ED Notes (Signed)
Report given to floor

## 2017-04-04 NOTE — ED Notes (Signed)
Speech to see patient.

## 2017-04-04 NOTE — Procedures (Signed)
ELECTROENCEPHALOGRAM REPORT  Date of Study: 04/04/2017  Patient's Name: [name] MRN: [id number] Date of Birth: March 08, 1929  Referring Provider: Elmo Putt, PA-C  Clinical History: This is an 84 year old man with slurred speech, leg weakness.  Medications: Xanax  Technical Summary: A multichannel digital EEG recording measured by the international 10-20 system with electrodes applied with paste and impedances below 5000 ohms performed in our laboratory with EKG monitoring in an awake and asleep patient.  Hyperventilation and photic stimulation were not performed.  The digital EEG was referentially recorded, reformatted, and digitally filtered in a variety of bipolar and referential montages for optimal display.    Description: The patient is awake and asleep during the recording.  During maximal wakefulness, there is a symmetric, medium voltage 8 Hz posterior dominant rhythm that attenuates with eye opening.  The record is symmetric.  During drowsiness and stage I sleep, there is an increase in theta slowing of the background with occasional vertex seen. Hyperventilation and photic stimulation were not performed.  There were no epileptiform discharges or electrographic seizures seen.    EKG lead was unremarkable.  Impression: This awake and asleep EEG is normal.    Clinical Correlation: A normal EEG does not exclude a clinical diagnosis of epilepsy.  Clinical correlation is advised.   [name], M.D.

## 2017-04-04 NOTE — Evaluation (Signed)
Clinical/Bedside Swallow Evaluation Patient Details  Name: [name] MRN: [id number] Date of Birth: 1929/06/07  Today's Date: 04/04/2017 Time: SLP Start Time (ACUTE ONLY): 4174 SLP Stop Time (ACUTE ONLY): 1538 SLP Time Calculation (min) (ACUTE ONLY): 8 min  Past Medical History:  Past Medical History:  Diagnosis Date  . Asthma   . BPH (benign prostatic hyperplasia)    severe; s/p multiple biopsies  . CAD (coronary artery disease)   . Carotid artery occlusion   . Chronic respiratory failure ([place])   . Chronic rhinitis   . COPD (chronic obstructive pulmonary disease) (HCC)    2L [place] O2  . Elevated troponin 03/09/2017  . Hyperlipidemia   . Hypertension   . On home oxygen therapy    "~ 24/7" (02/08/2017)  . Peripheral vascular disease ([place])   . Syncope and collapse   . Ureteral tumor 08/2015   had endoscopic procedure for evaluation, unable to reach for biopsy   Past Surgical History:  Past Surgical History:  Procedure Laterality Date  . CATARACT EXTRACTION  11-20-11  . CORONARY ARTERY BYPASS GRAFT  10/2001   LIMA to LAD, SVG to OM1-2, SVG to RCA and PDA  . CORONARY ARTERY BYPASS GRAFT    . LITHOTRIPSY    . PROSTATE BIOPSY  Oct. 2014   HPI:  [name] is a 84 y.o. male with a Past Medical History of PVD; HTN; HLD; COPD on 2L home O2; and CAD who presents with R arm tremor and B leg weakness.  He did have "excellent" day of PT yesterday but an otherwise unremarkable day and he was able to ambulate yesterday.  Admitted with question of slurred speech, increased leg weakness. MRI negative.     Assessment / Plan / Recommendation Clinical Impression  Pt demonstrates normal swallow function. No SLP f/u or diet modification needed. Will sign off.       Aspiration Risk       Diet Recommendation Regular;Thin liquid        Other  Recommendations     Follow up Recommendations None      Frequency and Duration            Prognosis        Swallow Study   General  HPI: [name] is a 83 y.o. male with a Past Medical History of PVD; HTN; HLD; COPD on 2L home O2; and CAD who presents with R arm tremor and B leg weakness.  He did have "excellent" day of PT yesterday but an otherwise unremarkable day and he was able to ambulate yesterday.  Admitted with question of slurred speech, increased leg weakness. MRI negative.   Type of Study: Bedside Swallow Evaluation Previous Swallow Assessment: none Diet Prior to this Study: Regular;Thin liquids Temperature Spikes Noted: No Respiratory Status: Nasal cannula History of Recent Intubation: No Behavior/Cognition: Alert;Cooperative;Pleasant mood Oral Cavity Assessment: Within Functional Limits Oral Care Completed by SLP: No Oral Cavity - Dentition: Adequate natural dentition Vision: Functional for self-feeding Self-Feeding Abilities: Able to feed self Patient Positioning: Upright in bed Baseline Vocal Quality: Normal Volitional Cough: Strong Volitional Swallow: Able to elicit    Oral/Motor/Sensory Function Overall Oral Motor/Sensory Function: Within functional limits   Ice Chips     Thin Liquid Thin Liquid: Within functional limits    Nectar Thick Nectar Thick Liquid: Not tested   Honey Thick Honey Thick Liquid: Not tested   Puree Puree: Within functional limits   Solid   GO  Solid: Within functional limits       [facility], MA CCC-SLP [phone]  Lynann Beaver 04/04/2017,3:45 PM

## 2017-04-04 NOTE — ED Notes (Signed)
Pt returned from MRI °

## 2017-04-04 NOTE — Consult Note (Signed)
NEURO HOSPITALIST CONSULT NOTE   Requesting physician: Dr. Kathrynn Humble  Reason for Consult: Acute onset of trembling in legs with weakness  History obtained from:   Patient, EMS and Chart     HPI:                                                                                                                                          [name] is an 84 y.o. male who presents to the ED via EMS after they were called to his home for sudden onset of bilateral lower extremity weakness and slurred speech. LKN was 2300 when he went to bed. On awakening at 2350 he tried to get out of bed, but was unable to fully stand up due to bilateral lower extremity weakness with "uncontrolled trembling" of the legs when exerting force. Both legs were equally affected. His wife noticed this as well as slurred speech and called EMS.   He has COPD with chronic hypoxic respiratory failure and steroid dependence; he is on home O2 for this. PMHx also includes CAD, carotid artery occlusion, HLD, PVD, HTN and remote CABG. He was recently discharged from the hospital after a chest pain evaluation. He states that he has never had a stroke or TIA in the past.   He denies unilateral weakness. The only new neurological symptoms consist of the dysarthria and the bilaterally symmetric leg weakness described above.   Home meds include ASA and atorvastatin.   Past Medical History:  Diagnosis Date  . Asthma   . BPH (benign prostatic hyperplasia)    severe; s/p multiple biopsies  . CAD (coronary artery disease)   . Carotid artery occlusion   . Chronic respiratory failure ([place])   . Chronic rhinitis   . COPD (chronic obstructive pulmonary disease) (H[facility])    2L [place] O2  . COPD (chronic obstructive pulmonary disease) ([place])   . Elevated troponin 03/09/2017  . HTN (hypertension)   . Hyperlipidemia   . Hypertension   . On home oxygen therapy    "~ 24/7" (02/08/2017)  . Peripheral vascular disease ([place])   .  Syncope and collapse   . Ureteral tumor 08/2015   had endoscopic procedure for evaluation, unable to reach for biopsy    Past Surgical History:  Procedure Laterality Date  . CATARACT EXTRACTION  11-20-11  . CORONARY ARTERY BYPASS GRAFT  10/2001   LIMA to LAD, SVG to OM1-2, SVG to RCA and PDA  . CORONARY ARTERY BYPASS GRAFT    . LITHOTRIPSY    . PROSTATE BIOPSY  Oct. 2014    Family History  Problem Relation Age of Onset  . Heart disease Mother   . Heart disease Father        Before age 13  .  Breast cancer Mother   . Cancer Mother        Breast cancer  . Hypertension Mother   . Other Mother        AAA  and   Amputation  . Heart attack Father   . Stroke Brother        x3, still living   . Peripheral vascular disease Brother   . Heart disease Brother   . Hyperlipidemia Brother   . Hypertension Brother   . Heart disease Brother   . Allergies Brother          Social History:  reports that he quit smoking about 35 years ago. His smoking use included cigarettes. He has a 66.00 pack-year smoking history. He has quit using smokeless tobacco. He reports that he drinks alcohol. He reports that he does not use drugs.  Allergies  Allergen Reactions  . Sulfa Antibiotics Anaphylaxis and Swelling  . Sulfonamide Derivatives Swelling    REACTION: facial/tongue swelling  . Cefdinir Diarrhea    REACTION: diarrhea  . Ciprofloxacin Itching and Rash    Red itchy hands  . Nitrofurantoin Swelling    Swollen hands  . Cefdinir Other (See Comments)    Severe Diarrhea  . Nitrofurantoin Swelling    Hand Swelling  . Ciprofloxacin Itching and Rash  . Levaquin [Levofloxacin] Itching and Rash  . Levaquin [Levofloxacin] Itching and Rash    HOME MEDICATIONS:                                                                                                                        ROS:                                                                                                                                        As per HPI.  There were no vitals taken for this visit.   General Examination:  Physical Exam  HEENT-  [place]/AT   Lungs-Respirations unlabored Extremities- Pretibial and pedal edema present bilaterally  Neurological Examination Mental Status: Awake and alert. Mild dysarthria. Able to speak in fluent, full sentences. Able to follow all commands and answer all questions. Non-agitated. Hard of hearing requires loud questions.  Cranial Nerves: II: Visual fields intact with no extinction to DSS III,IV, VI: Ptosis not present, EOMI without nystagmus.  V,VII: No facial droop. Facial temp sensation intact bilaterally VIII: HOH IX,X: Mild hypophonia XI: Head at midline XII: midline tongue extension Motor: 4+/5 bilateral upper extremities proximal and distal without asymmetry. Asterixis is noted bilaterally. 4/5 bilateral lower extremities proximally and distally without asymmetry. Asterixis of feet noted while at rest. When raising legs antigravity and resisting examiner, asterixis of each leg becomes more pronounced.  Sensory: Temp and light touch intact bilateral upper and lower ext without asymmetry. No extinction.  Deep Tendon Reflexes:  1+ bilateral brachioradialis and biceps. Trace patellar reflexes.  0 achilles reflexes.  Plantars: Right: downgoing  Left: downgoing Cerebellar: No ataxia with FNF bilaterally. Prominent dyssinergia with heel-shin bilaterally.  Gait: Deferred due to falls risk concerns   Lab Results: Basic Metabolic Panel: Recent Labs  Lab 04/04/17 0056  NA 134*  K 4.3  CL 93*  GLUCOSE 121*  BUN 20  CREATININE 1.20    CBC: Recent Labs  Lab 04/04/17 0056  HGB 12.6*  HCT 37.0*    Cardiac Enzymes: No results for input(s): CKTOTAL, CKMB, CKMBINDEX, TROPONINI in the last 168 hours.  Lipid Panel: No results for input(s): CHOL, TRIG, HDL,  CHOLHDL, VLDL, LDLCALC in the last 168 hours.  Imaging: CT head normal for age and stable since prior CT.   Assessment: 84 year old male with acute onset of lower extremity tremulousness and weakness. 1. Exam findings most consistent with asterixis, most likely toxic or metabolic. High on the DDx would be a hypercarbic or hypoxic etiology.  2. No lateralizing findings, dysphasia, neglect or field cut to suggest an acute stroke.  3. CT head is stable.   Recommendations: 1. ABG 2. LFTs, ammonia level and electrolytes. Include Mg level. 3. Urinalysis and CXR.  4. Close monitoring. Will likely need to be admitted.   Electronically signed: Dr. Kerney Elbe 04/04/2017, 1:14 AM

## 2017-04-04 NOTE — ED Notes (Signed)
Call to 3W about pt being taken to room

## 2017-04-04 NOTE — Progress Notes (Signed)
ED report received at 1410 and pt arrived to the unit at 1625. Pt A&O x4; remains on baseline 2liters oxygen; pt oriented to the unit and room; fall safety precaution/prevention education completed with pt and spouse at bedside; both voices understanding and denied any questions. Telemetry applied and verified with CCMD; NT called to second verify. Pt skin dry and intact with no pressure ulcer or opened wounds noted; pt BLE ankles and feet edematous. MAE x4; call light within reach and will closely monitor pt. Delia Heady RN

## 2017-04-04 NOTE — ED Notes (Signed)
Patient transported to MRI 

## 2017-04-04 NOTE — ED Notes (Signed)
Secretary to message MD about + troponin

## 2017-04-04 NOTE — ED Notes (Signed)
Returned from CT scan , placed on a monitor and pulse oximetry , IV sites intact .

## 2017-04-05 DIAGNOSIS — J9611 Chronic respiratory failure with hypoxia: Secondary | ICD-10-CM | POA: Diagnosis not present

## 2017-04-05 DIAGNOSIS — T50905A Adverse effect of unspecified drugs, medicaments and biological substances, initial encounter: Secondary | ICD-10-CM | POA: Diagnosis not present

## 2017-04-05 DIAGNOSIS — R278 Other lack of coordination: Secondary | ICD-10-CM | POA: Diagnosis not present

## 2017-04-05 DIAGNOSIS — R531 Weakness: Secondary | ICD-10-CM | POA: Diagnosis not present

## 2017-04-05 LAB — CBC
HCT: 35.2 % — ABNORMAL LOW (ref 39.0–52.0)
Hemoglobin: 11.5 g/dL — ABNORMAL LOW (ref 13.0–17.0)
MCH: 31.7 pg (ref 26.0–34.0)
MCHC: 32.7 g/dL (ref 30.0–36.0)
MCV: 97 fL (ref 78.0–100.0)
Platelets: 193 10*3/uL (ref 150–400)
RBC: 3.63 MIL/uL — ABNORMAL LOW (ref 4.22–5.81)
RDW: 12.7 % (ref 11.5–15.5)
WBC: 7.1 10*3/uL (ref 4.0–10.5)

## 2017-04-05 LAB — BASIC METABOLIC PANEL
Anion gap: 9 (ref 5–15)
BUN: 14 mg/dL (ref 6–20)
CO2: 29 mmol/L (ref 22–32)
Calcium: 8.8 mg/dL — ABNORMAL LOW (ref 8.9–10.3)
Chloride: 100 mmol/L — ABNORMAL LOW (ref 101–111)
Creatinine, Ser: 1.01 mg/dL (ref 0.61–1.24)
GFR calc Af Amer: >60 mL/min (ref >60)
GFR calc non Af Amer: >60 mL/min (ref >60)
Glucose, Bld: 88 mg/dL (ref 65–99)
Potassium: 4 mmol/L (ref 3.5–5.1)
Sodium: 138 mmol/L (ref 135–145)

## 2017-04-05 LAB — TROPONIN I: Troponin I: 0.03 ng/mL (ref <0.03)

## 2017-04-05 MED ORDER — ENSURE ENLIVE PO LIQD: 237.0000 mL | Freq: Two times a day (BID) | ORAL | 12 refills | Status: DC

## 2017-04-05 NOTE — Progress Notes (Addendum)
Reason for consult:   Subjective:   Doing well this morning. Feels his strength has improved. Inquiring about discharge.   Patient reports he has been hospitalized 3 times since February, this admission being his 4th. He reports his health began to decline with his initial hospitalization in February for influenza A. He also reports chronic orthostatic hypotension ongoing x 1 year. Continues to have dizziness with standing. Orthostatics have not been checked.   ROS: negative except above  Examination  Vital signs in last 24 hours: Temp:  [97.4 F (36.3 C)-98 F (36.7 C)] 97.9 F (36.6 C) (03/28 0737) Pulse Rate:  [65-85] 85 (03/28 0737) Resp:  [17-24] 18 (03/28 0737) BP: (116-160)/(60-78) 125/69 (03/28 0737) SpO2:  [84 %-100 %] 96 % (03/28 0737)  General: Not in distress, cooperative CVS: pulse-normal rate and rhythm RS: breathing comfortably Extremities: normal   Neuro: MS: Alert, oriented, follows commands CN: pupils equal and reactive,  EOMI, face symmetric, tongue midline, normal sensation over face, Motor: 5/5 strength in all 4 extremities Sensation: intact to light touch  Coordination: normal Gait: Normal, steady on feet  Basic Metabolic Panel: Recent Labs  Lab 04/04/17 0056 04/04/17 0252 04/04/17 1114  NA 135  134*  --   --   K 4.3  4.3  --   --   CL 94*  93*  --   --   CO2 31  --   --   GLUCOSE 120*  121*  --   --   BUN 16  20  --   --   CREATININE 1.21  1.20  --  1.23  CALCIUM 9.4  --   --   MG  --  2.4  --     CBC: Recent Labs  Lab 04/04/17 0056 04/04/17 1114 04/05/17 0612  WBC 9.4 10.4 7.1  NEUTROABS 8.2*  --   --   HGB 12.7*  12.6* 11.9* 11.5*  HCT 38.4*  37.0* 35.6* 35.2*  MCV 96.7 97.5 97.0  PLT 212 195 193     Coagulation Studies: Recent Labs    04/04/17 0056  LABPROT 13.8  INR 1.06    Imaging Reviewed:   3/27 CT Head Code Stroke: IMPRESSION: 1. Stable and normal for age noncontrast CT appearance of the brain. 2.  ASPECTS is 10. 3. These results were communicated to Dr. Cheral Marker at 1:17 amon 3/27/2019by text page via the [facility] messaging system.  3/27 MRI Brain:  IMPRESSION: No acute abnormality. Atrophy and mild chronic microvascular ischemia.  ASSESSMENT: 84 yo M with pmhx of COPD with chronic respiratory failure on 2L [place] during the day & BiPAP at night, HTN, HLD, CAD, and CHF presenting with acute onset lower extremity tremulousness and weakness after getting out of bed in the middle of the night to use to bathroom. This is in the setting severe deconditioning after multiple recent hospitalization and low BP readings at home. CT head and MRI brain negative.   IMPRESSION: -- Tremors & weakness likely due to orthostatic hypotension, deconditioning. -- Asterixis likely secondary to transient hypercarbia. CO2 mildly elevated yesterday on ABG but not checked on admission.  Not present today. -- Overall improved  PLAN: -- Continue PT/OT -- Check orthostatics, avoid medications that can worsen orthostatic hypotension -- Neurology will sign off, please call with any questions  Velna Ochs, M.D. - PGY2 04/05/2017, 9:13 AM    NEUROHOSPITALIST ADDENDUM Seen and examined the patient today. I have reviewed and  agree with the above history, exam findings  and formulated plan as documented above by PAC/Resident. Few changes made to above note.    Will sign off. Thanks for consult.    Karena Addison Subrina Vecchiarelli MD [facility] [phone]  If 7pm to 7am, please call on call as listed on AMION.

## 2017-04-05 NOTE — Care Management Obs Status (Signed)
[facility] NOTIFICATION   Patient Details  Name: [name] MRN: [id number] Date of Birth: 1929/03/05   Medicare Observation Status Notification Given:  Yes    Pollie Friar, RN 04/05/2017, 2:21 PM

## 2017-04-05 NOTE — Care Management Note (Signed)
Case Management Note  Patient Details  Name: BONNIE ROIG MRN: [id number] Date of Birth: 09/16/1929  Subjective/Objective:    Pt in with weakness. He is from home with his wife. He is active with [facility] prior to hospitalization for PT/RN.   Action/Plan: Pt discharging home with resumption of [facility] services with the addition of CSW. Butch Penny with [facility] made aware of d/c and CSW. Pts son with a lot of questions about assistance at home. CM encouraged him to call and try and set up some aide services in the home to assist his parents.  Pt with orders for a walker. Butch Penny with [facility] made aware and delivered to the room. Son to provide transportation home.   Expected Discharge Date:  04/05/17               Expected Discharge Plan:  [place]  In-House Referral:     Discharge planning Services  CM Consult  Post Acute Care Choice:  Home Health, Durable Medical Equipment, Resumption of Svcs/PTA Provider Choice offered to:  Patient, Spouse, Adult Children  DME Arranged:  Walker rolling DME Agency:  [place]:  Social Work(already has [place] [facility], sports and PT) [place]:  [place]  Status of Service:  Completed, signed off  If discussed at [employer], dates discussed:    Additional Comments:  Pollie Friar, RN 04/05/2017, 4:23 PM

## 2017-04-05 NOTE — Evaluation (Signed)
Physical Therapy Evaluation Patient Details Name: [name] MRN: [id number] DOB: 1929/02/12 Today's Date: 04/05/2017   History of Present Illness  84yo male presenting with chest pain, also recent COPD exacerbation secondary to flu and a stress test on 03/12/17 with large infarct inferior wall. He went home but began to have chest pain again at increasing frequency and returned to the ED. PMH CAD, COPD, HTN, hx CABG   Clinical Impression  Patient presents with decreased mobility due to general weakness and deconditioning.  Feel he is safe for d/c home with family support and to resume HHPT.  Would like a walker for home as well.  Will follow if not d/c today.     Follow Up Recommendations Home health PT(resume)    Equipment Recommendations  Rolling walker with 5" wheels    Recommendations for Other Services       Precautions / Restrictions Precautions Precaution Comments: oxygen dependent, h/o ortostatic hypotension      Mobility  Bed Mobility               General bed mobility comments: At EOB upon arrival  Transfers Overall transfer level: Needs assistance Equipment used: Rolling walker (2 wheeled) Transfers: Sit to/from Stand Sit to Stand: Supervision         General transfer comment: S for safety  Ambulation/Gait Ambulation/Gait assistance: Supervision;Min guard Ambulation Distance (Feet): 90 Feet Assistive device: Rolling walker (2 wheeled) Gait Pattern/deviations: Step-through pattern;Decreased stride length;Shuffle Gait velocity: slow pace   General Gait Details: cues for using walker and safety with turns, SpO2 95% on 2L O2 throughout  Stairs            Wheelchair Mobility    Modified Rankin (Stroke Patients Only)       Balance Overall balance assessment: Needs assistance Sitting-balance support: No upper extremity supported Sitting balance-Leahy Scale: Good       Standing balance-Leahy Scale: Fair Standing balance comment: feels  more confident with UE support for ambulation                             Pertinent Vitals/Pain Pain Assessment: No/denies pain    Home Living Family/patient expects to be discharged to:: Private residence Living Arrangements: Spouse/significant other Available Help at Discharge: Family;Available 24 hours/day   Home Access: Level entry     Home Layout: Multi-level Home Equipment: Cane - single point;Shower seat;Transport chair Additional Comments: O2 2L    Prior Function           Comments: reports he had been doing well outside of COPD symptoms      Hand Dominance        Extremity/Trunk Assessment        Lower Extremity Assessment Lower Extremity Assessment: Overall WFL for tasks assessed(bilat pedal edema w/ compression stockings)    Cervical / Trunk Assessment Cervical / Trunk Assessment: Kyphotic  Communication   Communication: No difficulties  Cognition Arousal/Alertness: Awake/alert Behavior During Therapy: WFL for tasks assessed/performed Overall Cognitive Status: Within Functional Limits for tasks assessed                                        General Comments General comments (skin integrity, edema, etc.): wife and son in room, already getting HHPT 2 x a week, interested in regular walker    Exercises  Assessment/Plan    PT Assessment Patient needs continued PT services  PT Problem List Decreased strength;Decreased mobility;Decreased safety awareness;Decreased knowledge of use of DME;Decreased balance;Decreased activity tolerance       PT Treatment Interventions DME instruction;Therapeutic activities;Gait training;Therapeutic exercise;Patient/family education;Stair training;Balance training;Functional mobility training    PT Goals (Current goals can be found in the Care Plan section)  Acute Rehab PT Goals Patient Stated Goal: Return home today PT Goal Formulation: With patient/family Time For Goal  Achievement: 04/12/17 Potential to Achieve Goals: Good    Frequency Min 3X/week   Barriers to discharge        Co-evaluation               AM-PAC PT "6 Clicks" Daily Activity  Outcome Measure Difficulty turning over in bed (including adjusting bedclothes, sheets and blankets)?: None Difficulty moving from lying on back to sitting on the side of the bed? : A Little Difficulty sitting down on and standing up from a chair with arms (e.g., wheelchair, bedside commode, etc,.)?: A Little Help needed moving to and from a bed to chair (including a wheelchair)?: A Little Help needed walking in hospital room?: A Little Help needed climbing 3-5 steps with a railing? : A Little 6 Click Score: 19    End of Session Equipment Utilized During Treatment: Gait belt;Oxygen Activity Tolerance: Patient limited by fatigue Patient left: in bed;with family/visitor present;with call bell/phone within reach   PT Visit Diagnosis: Other abnormalities of gait and mobility (R26.89);Muscle weakness (generalized) (M62.81)    Time: 1610-9604 PT Time Calculation (min) (ACUTE ONLY): 31 min   Charges:   PT Evaluation $PT Eval Moderate Complexity: 1 Mod PT Treatments $Gait Training: 8-22 mins   PT G CodesMagda Kiel, [place] [phone] 04/05/2017   Reginia Naas 04/05/2017, 1:57 PM

## 2017-04-05 NOTE — Discharge Summary (Signed)
Physician Discharge Summary  [name] GUY:[id number] DOB: 1929/05/07 DOA: 04/04/2017  PCP: [name], MD  Admit date: 04/04/2017 Discharge date: 04/05/2017   Recommendations for Outpatient Follow-Up:   1. Consider introducing palliative care??  4 admissions in last 6 months... 2. Close follow up with Dr. Tamala Julian 3. TED hose 4. Resumed home health   Discharge Diagnosis:   Principal Problem:   Weakness Active Problems:   Hyperlipidemia   Essential hypertension   Chronic obstructive pulmonary disease (HCC)   Coronary artery disease involving coronary bypass graft of native heart with angina pectoris (HCC)   Peripheral vascular disease (HCC)   GERD (gastroesophageal reflux disease)   Anxiety   CAD (coronary artery disease)   Chronic respiratory failure with hypoxia [facility])   Discharge disposition:  Home  Discharge Condition: Improved.  Diet recommendation: Low sodium, heart healthy  Wound care: None.   History of Present Illness:   [name] is a 84 y.o. male with a Past Medical History of PVD; HTN; HLD; COPD on 2L home O2; and CAD who presents with R arm tremor and B leg weakness.  He did have "excellent" day of PT yesterday but an otherwise unremarkable day and he was able to ambulate yesterday.  ?slurred speech.  He initially failed his bedside swallow evaluation but family was concerned that this was related to positioning; he did pass repeat attempt.  At the time of my evaluation, he appeared fatigued and weak with weak speech and mild slurring (which family attributed to dry mouth).  He has 3+ LE edema, L>R, which wife reports is chronic and actually improved.  He had a small amount of lingering LUE > RUE asterixis.   Hospital Course by Problem:   Weakness, tremor -? From medication-- ranexa vs amoxicillin or hypercapnia (CO2 49 on ABG) -MRI/EEG unremarkable -PT-- resume home health -neuro has seen and signed off -plan to d/c home  H/o  orthostatics -encouraged TED hose -? stim test if continues--- on steroids-- no pattern to low BP    Medical Consultants:    Neuro   Discharge Exam:   Vitals:   04/05/17 0737 04/05/17 1100  BP: 125/69 (!) 158/79  Pulse: 85   Resp: 18   Temp: 97.9 F (36.6 C)   SpO2: 96%    Vitals:   04/04/17 2352 04/05/17 0433 04/05/17 0737 04/05/17 1100  BP: 118/69 116/60 125/69 (!) 158/79  Pulse: 79 84 85   Resp: 18 18 18    Temp: 97.8 F (36.6 C) 98 F (36.7 C) 97.9 F (36.6 C)   TempSrc: Oral Oral Oral   SpO2: 93% 98% 96%   Weight:      Height:        Gen:  NAD-feeling better- wants to go home    The results of significant diagnostics from this hospitalization (including imaging, microbiology, ancillary and laboratory) are listed below for reference.     Procedures and Diagnostic Studies:   Mr Brain Wo Contrast  Result Date: 04/04/2017 CLINICAL DATA:  TIA.  Lower extremity weakness and slurred speech EXAM: MRI HEAD WITHOUT CONTRAST TECHNIQUE: Multiplanar, multiecho pulse sequences of the brain and surrounding structures were obtained without intravenous contrast. COMPARISON:  CT head 04/04/2017 FINDINGS: Brain: Negative for acute infarct. Mild chronic microvascular ischemic change in the white matter. Generalized cortical atrophy. Negative for hydrocephalus. Negative for hemorrhage or mass. No edema or midline shift. Vascular: Normal arterial flow voids Skull and upper cervical spine: Negative Sinuses/Orbits: Mild mucosal  edema paranasal sinuses. Bilateral cataract removal Other: None IMPRESSION: No acute abnormality. Atrophy and mild chronic microvascular ischemia. Electronically Signed   By: Franchot Gallo M.D.   On: 04/04/2017 13:20   Portable Chest 1 View  Result Date: 04/04/2017 CLINICAL DATA:  Shortness of Breath EXAM: PORTABLE CHEST 1 VIEW COMPARISON:  March 24, 2017 FINDINGS: Lungs are hyperexpanded. There is no edema or consolidation. There is mild bibasilar scarring.  The heart size and pulmonary vascularity are normal. No adenopathy. There is aortic atherosclerosis. Patient is status post coronary artery bypass grafting. There is degenerative change in each shoulder. IMPRESSION: Lungs hyperexpanded without edema or consolidation. Mild bibasilar scarring. Heart size normal. There is aortic atherosclerosis. Aortic Atherosclerosis (ICD10-I70.0). Electronically Signed   By: Lowella Grip III M.D.   On: 04/04/2017 11:26   Ct Head Code Stroke Wo Contrast  Result Date: 04/04/2017 CLINICAL DATA:  Code stroke. 84 year old male with speech deficit and decreased coordination. Last seen normal 2300 hours. EXAM: CT HEAD WITHOUT CONTRAST TECHNIQUE: Contiguous axial images were obtained from the base of the skull through the vertex without intravenous contrast. COMPARISON:  [facility] head CT without contrast 02/02/2016. Brain MRI and intracranial MRA 07/13/2012. FINDINGS: Brain: No acute intracranial hemorrhage identified. No midline shift, mass effect, or evidence of intracranial mass lesion. No ventriculomegaly. Cerebral volume and gray-white matter differentiation are stable and within normal limits for age. No cortically based acute infarct identified. No cortical encephalomalacia identified. Vascular: Calcified atherosclerosis at the skull base. No suspicious intracranial vascular hyperdensity. Skull: No acute osseous abnormality identified. Sinuses/Orbits: Trace fluid in the sphenoid sinuses, otherwise stable and well pneumatized. Other: No acute orbit or scalp soft tissue finding. ASPECTS ([place] Stroke Program Early CT Score) - Ganglionic level infarction (caudate, lentiform nuclei, internal capsule, insula, M1-M3 cortex): 7 - Supraganglionic infarction (M4-M6 cortex): 3 Total score (0-10 with 10 being normal): 10 IMPRESSION: 1. Stable and normal for age noncontrast CT appearance of the brain. 2. ASPECTS is 10. 3. These results were communicated to Dr. Cheral Marker at 1:17  amon 3/27/2019by text page via the [facility] messaging system. Electronically Signed   By: Genevie Ann M.D.   On: 04/04/2017 01:18     Labs:   Basic Metabolic Panel: Recent Labs  Lab 04/04/17 0056 04/04/17 0252 04/04/17 1114 04/05/17 0612  NA 135  134*  --   --  138  K 4.3  4.3  --   --  4.0  CL 94*  93*  --   --  100*  CO2 31  --   --  29  GLUCOSE 120*  121*  --   --  88  BUN 16  20  --   --  14  CREATININE 1.21  1.20  --  1.23 1.01  CALCIUM 9.4  --   --  8.8*  MG  --  2.4  --   --    GFR Estimated Creatinine Clearance: 44.6 mL/min (by C-G formula based on SCr of 1.01 mg/dL). Liver Function Tests: Recent Labs  Lab 04/04/17 0056  AST 27  ALT 20  ALKPHOS 61  BILITOT 0.8  PROT 6.0*  ALBUMIN 4.0   No results for input(s): LIPASE, AMYLASE in the last 168 hours. Recent Labs  Lab 04/04/17 0252  AMMONIA 24   Coagulation profile Recent Labs  Lab 04/04/17 0056  INR 1.06    CBC: Recent Labs  Lab 04/04/17 0056 04/04/17 1114 04/05/17 0612  WBC 9.4 10.4 7.1  NEUTROABS 8.2*  --   --  HGB 12.7*  12.6* 11.9* 11.5*  HCT 38.4*  37.0* 35.6* 35.2*  MCV 96.7 97.5 97.0  PLT 212 195 193   Cardiac Enzymes: Recent Labs  Lab 04/04/17 1114 04/04/17 1616 04/04/17 2323  TROPONINI 0.03* 0.03* 0.03*   BNP: Invalid input(s): POCBNP CBG: Recent Labs  Lab 04/04/17 0114 04/04/17 2149  GLUCAP 117* 117*   D-Dimer No results for input(s): DDIMER in the last 72 hours. Hgb A1c No results for input(s): HGBA1C in the last 72 hours. Lipid Profile No results for input(s): CHOL, HDL, LDLCALC, TRIG, CHOLHDL, LDLDIRECT in the last 72 hours. Thyroid function studies No results for input(s): TSH, T4TOTAL, T3FREE, THYROIDAB in the last 72 hours.  Invalid input(s): FREET3 Anemia work up No results for input(s): VITAMINB12, FOLATE, FERRITIN, TIBC, IRON, RETICCTPCT in the last 72 hours. Microbiology No results found for this or any previous visit (from the past 240  hour(s)).   Discharge Instructions:   Discharge Instructions    Diet - low sodium heart healthy   Complete by:  As directed    Discharge instructions   Complete by:  As directed    Close follow up with Dr. Tamala Julian (cardiology) -resumed home health -wear TED hose   Increase activity slowly   Complete by:  As directed      Allergies as of 04/05/2017      Reactions   Sulfa Antibiotics Anaphylaxis, Swelling   Sulfonamide Derivatives Swelling   REACTION: facial/tongue swelling   Cefdinir Diarrhea   REACTION: diarrhea   Ciprofloxacin Itching, Rash   Red itchy hands   Nitrofurantoin Swelling   Swollen hands   Cefdinir Other (See Comments)   Severe Diarrhea   Nitrofurantoin Swelling   Hand Swelling   Ciprofloxacin Itching, Rash   Levaquin [levofloxacin] Itching, Rash   Levaquin [levofloxacin] Itching, Rash      Medication List    STOP taking these medications   amoxicillin 250 MG capsule Commonly known as:  AMOXIL   nitroGLYCERIN 0.4 MG SL tablet Commonly known as:  NITROSTAT   pantoprazole 40 MG tablet Commonly known as:  PROTONIX   ranolazine 1000 MG SR tablet Commonly known as:  RANEXA     TAKE these medications   ALPRAZolam 0.25 MG tablet Commonly known as:  XANAX Take half (1/2) tablet by mouth three (3) times daily. What changed:  Another medication with the same name was removed. Continue taking this medication, and follow the directions you see here.   aspirin 81 MG tablet Take 81 mg by mouth daily. What changed:  Another medication with the same name was removed. Continue taking this medication, and follow the directions you see here.   atorvastatin 20 MG tablet Commonly known as:  LIPITOR TAKE 1 TABLET BY MOUTH EVERY DAY What changed:    how much to take  how to take this  when to take this  Another medication with the same name was removed. Continue taking this medication, and follow the directions you see here.   b complex vitamins  capsule Take 1 capsule by mouth daily.   feeding supplement (ENSURE ENLIVE) Liqd Take 237 mLs by mouth 2 (two) times daily between meals.   finasteride 5 MG tablet Commonly known as:  PROSCAR Take 5 mg by mouth every morning. What changed:  Another medication with the same name was removed. Continue taking this medication, and follow the directions you see here.   furosemide 20 MG tablet Commonly known as:  LASIX Take 1 tablet (20  mg total) by mouth daily. Can take one additional pill if needed for swelling. What changed:  Another medication with the same name was removed. Continue taking this medication, and follow the directions you see here.   guaiFENesin 600 MG 12 hr tablet Commonly known as:  MUCINEX Take 600 mg by mouth 2 (two) times daily as needed for cough. What changed:  Another medication with the same name was removed. Continue taking this medication, and follow the directions you see here.   metoprolol succinate 25 MG 24 hr tablet Commonly known as:  TOPROL-XL Take 0.5 tablets (12.5 mg total) by mouth daily.   mometasone 50 MCG/ACT nasal spray Commonly known as:  NASONEX SHAKE LIQUID AND USE 2 SPRAYS IN EACH NOSTRIL DAILY What changed:  Another medication with the same name was removed. Continue taking this medication, and follow the directions you see here.   MURO 128 OP Place 1 drop 4 (four) times daily into both eyes.   sodium chloride 2 % ophthalmic solution Commonly known as:  MURO 128 Place 1 drop into both eyes every 4 (four) hours as needed for eye irritation.   OXYGEN Inhale 2 L continuous into the lungs. What changed:  Another medication with the same name was removed. Continue taking this medication, and follow the directions you see here.   oxymetazoline 0.05 % nasal spray Commonly known as:  AFRIN Place 1 spray into both nostrils 2 (two) times daily as needed for congestion.   polyethylene glycol packet Commonly known as:  MIRALAX /  GLYCOLAX Take 17 g by mouth 2 (two) times daily as needed for mild constipation. What changed:  Another medication with the same name was removed. Continue taking this medication, and follow the directions you see here.   predniSONE 10 MG tablet Commonly known as:  DELTASONE Take 10 mg by mouth daily with breakfast. What changed:  Another medication with the same name was removed. Continue taking this medication, and follow the directions you see here.   PRESERVISION AREDS 2 Caps Take 1 capsule by mouth daily.   PRESERVISION AREDS 2 PO Take 1 capsule by mouth daily.   ranitidine 150 MG tablet Commonly known as:  ZANTAC Take 150 mg by mouth 2 (two) times daily. What changed:  Another medication with the same name was removed. Continue taking this medication, and follow the directions you see here.   SPIRIVA RESPIMAT 2.5 MCG/ACT Aers Generic drug:  Tiotropium Bromide Monohydrate Inhale 2 puffs into the lungs daily. What changed:  Another medication with the same name was removed. Continue taking this medication, and follow the directions you see here.   SYMBICORT 160-4.5 MCG/ACT inhaler Generic drug:  budesonide-formoterol INHALE 2 PUFFS BY MOUTH EVERY MORNING AND THEN ANOTHER 2 PUFFS ABOUT 12 HOURS LATER What changed:  Another medication with the same name was removed. Continue taking this medication, and follow the directions you see here.   VITAMIN B 12 PO Take 1 tablet daily by mouth. What changed:  Another medication with the same name was removed. Continue taking this medication, and follow the directions you see here.   Vitamin D3 2000 units Tabs Take 2,000 Units by mouth daily.   XOPENEX HFA 45 MCG/ACT inhaler Generic drug:  levalbuterol INHALE 2 PUFFS BY MOUTH EVERY 4 HOURS AS NEEDED FOR WHEEZING OR SHORTNESS OF BREATH What changed:  Another medication with the same name was removed. Continue taking this medication, and follow the directions you see here.  Durable Medical Equipment  (From admission, onward)        Start     Ordered   04/05/17 1123  For home use only DME Walker rolling  Once    Question:  Patient needs a walker to treat with the following condition  Answer:  Unsteady   04/05/17 1122     Follow-up Information    [name], MD Follow up in 1 week(s).   Specialty:  Family Medicine Contact information: [place] [address] [phone]        Belva Crome, MD Follow up.   Specialty:  Cardiology Contact information: [address] [place] [address] [phone]            Time coordinating discharge: 35 min  Signed:  Geradine Girt   [facility] 04/05/2017, 1:29 PM

## 2017-04-06 ENCOUNTER — Other Ambulatory Visit: Payer: Self-pay | Admitting: *Deleted

## 2017-04-06 ENCOUNTER — Telehealth: Payer: Self-pay | Admitting: Interventional Cardiology

## 2017-04-06 DIAGNOSIS — J9612 Chronic respiratory failure with hypercapnia: Secondary | ICD-10-CM | POA: Diagnosis not present

## 2017-04-06 DIAGNOSIS — I10 Essential (primary) hypertension: Secondary | ICD-10-CM | POA: Diagnosis not present

## 2017-04-06 DIAGNOSIS — J09X2 Influenza due to identified novel influenza A virus with other respiratory manifestations: Secondary | ICD-10-CM | POA: Diagnosis not present

## 2017-04-06 DIAGNOSIS — J441 Chronic obstructive pulmonary disease with (acute) exacerbation: Secondary | ICD-10-CM | POA: Diagnosis not present

## 2017-04-06 DIAGNOSIS — I2510 Atherosclerotic heart disease of native coronary artery without angina pectoris: Secondary | ICD-10-CM | POA: Diagnosis not present

## 2017-04-06 DIAGNOSIS — J9611 Chronic respiratory failure with hypoxia: Secondary | ICD-10-CM | POA: Diagnosis not present

## 2017-04-06 NOTE — Telephone Encounter (Signed)
Spoke with Beth with [facility].  She was in pt's home to see him today and HR was 100-110.  HR was regular, just elevated.  Pt normally runs in the 80's.  BP was 122/78 sitting and when pt stood it dropped to 90/60 and pt had some dizziness.  Pt recently in hospital for medication side effect.  ER physician believed this was related to the Ranexa so it has been stopped.  Advised Beth pt does have some history of decreased BP.  Metoprolol dose recently changed d/t hypotension.  Advised Beth to have pt be careful with position changes and continue monitoring vitals.  Advised to have pt contact office if sx continue.  Will route to Dr. Tamala Julian for further review.

## 2017-04-06 NOTE — Telephone Encounter (Signed)
Left message to call back  

## 2017-04-06 NOTE — Patient Outreach (Signed)
[facility]) Care Management  04/06/2017  [name] 1929-04-05 [id number]    Telephone Assessment (ED 3/28)  RN spoke with pt today and confirmed a recent ED visit related to medication side effects. Pt states he remains very weak but will resume working with [employer] [facility] [employer] for PT services. Pt aware of all his medications with the recent changes and continues to have a supportive spouse to assist. RN review his plan of care once again and adjusted interventions accordingly reflecting his recent ED visit. RN also reintroduced [facility] for ongoing preventative care and the availability for other services if enrolled into there program. Pt with clear understanding and will discuss this information with his spouse to possible consider. RN inquired on any current community needs at this time (none). Will scheduled ongoing transition of care calls next week concerning pt's progress.   [name], RN Care Management Coordinator [place] Office [phone]

## 2017-04-06 NOTE — Telephone Encounter (Signed)
New Message   Pt c/o medication issue:  1. Name of Medication: Renexa  2. How are you currently taking this medication (dosage and times per day)?   3. Are you having a reaction (difficulty breathing--STAT)?   4. What is your medication issue? Beth at [facility] physical therapy states that the pt was in the hospital and they took him off his Renexa due to having a reaction and today he has a bp of 122/78 siting and 90/60 standing, states he didn't feel dizzy but felt like his "head was draining". HR elevated, ranging from 99-110 which is abnormal for him. Please call

## 2017-04-08 NOTE — Telephone Encounter (Signed)
HR up due to beta blocker withdrawal. Okay with current recommendation.

## 2017-04-09 ENCOUNTER — Ambulatory Visit: Payer: Self-pay | Admitting: *Deleted

## 2017-04-09 ENCOUNTER — Telehealth: Payer: Self-pay | Admitting: Internal Medicine

## 2017-04-09 DIAGNOSIS — I2510 Atherosclerotic heart disease of native coronary artery without angina pectoris: Secondary | ICD-10-CM | POA: Diagnosis not present

## 2017-04-09 DIAGNOSIS — I10 Essential (primary) hypertension: Secondary | ICD-10-CM | POA: Diagnosis not present

## 2017-04-09 DIAGNOSIS — J441 Chronic obstructive pulmonary disease with (acute) exacerbation: Secondary | ICD-10-CM | POA: Diagnosis not present

## 2017-04-09 DIAGNOSIS — J9611 Chronic respiratory failure with hypoxia: Secondary | ICD-10-CM | POA: Diagnosis not present

## 2017-04-09 DIAGNOSIS — J09X2 Influenza due to identified novel influenza A virus with other respiratory manifestations: Secondary | ICD-10-CM | POA: Diagnosis not present

## 2017-04-09 DIAGNOSIS — J9612 Chronic respiratory failure with hypercapnia: Secondary | ICD-10-CM | POA: Diagnosis not present

## 2017-04-09 NOTE — Telephone Encounter (Signed)
Called and spoke with patient. Advised patient that Dr. [name] said to see Rexene Edison, NP on 04/19/17. Cancelled appointment for 04/11/17.  Patient verbalized understanding.

## 2017-04-09 NOTE — Telephone Encounter (Signed)
Diag was profound asterixis with neurological symtpoms.  States is much better now but has green mucous and started z-pack today.  Considering this, would Dr. [name] want him to keep his appt on 04/03 with MW or cancel and  follow up with TP on 04/11 for med cal as scheduled.

## 2017-04-09 NOTE — Telephone Encounter (Signed)
Spoke with patient. He stated that he was in the hospital from 04/04/17 to 04/05/17 for weakness and tremors. He has an appt with you on 04/11/17 and an appt with TP on 04/19/17.   Per his chart, he last saw you on 03/22/17 and was advised to follow up with TP on 04/19/17.   He wants to know if he needs to cancel his appointment with you on 04/11/17 and keep the appt with TP.   MW, please advise. Thanks!

## 2017-04-09 NOTE — Telephone Encounter (Signed)
Yes just see Tammy NP

## 2017-04-10 DIAGNOSIS — I2510 Atherosclerotic heart disease of native coronary artery without angina pectoris: Secondary | ICD-10-CM | POA: Diagnosis not present

## 2017-04-10 DIAGNOSIS — J09X2 Influenza due to identified novel influenza A virus with other respiratory manifestations: Secondary | ICD-10-CM | POA: Diagnosis not present

## 2017-04-10 DIAGNOSIS — J9612 Chronic respiratory failure with hypercapnia: Secondary | ICD-10-CM | POA: Diagnosis not present

## 2017-04-10 DIAGNOSIS — I10 Essential (primary) hypertension: Secondary | ICD-10-CM | POA: Diagnosis not present

## 2017-04-10 DIAGNOSIS — J441 Chronic obstructive pulmonary disease with (acute) exacerbation: Secondary | ICD-10-CM | POA: Diagnosis not present

## 2017-04-10 DIAGNOSIS — J9611 Chronic respiratory failure with hypoxia: Secondary | ICD-10-CM | POA: Diagnosis not present

## 2017-04-11 ENCOUNTER — Other Ambulatory Visit: Payer: Self-pay | Admitting: Adult Health

## 2017-04-11 ENCOUNTER — Ambulatory Visit: Payer: Medicare Other | Admitting: Internal Medicine

## 2017-04-11 DIAGNOSIS — J449 Chronic obstructive pulmonary disease, unspecified: Secondary | ICD-10-CM | POA: Diagnosis not present

## 2017-04-11 DIAGNOSIS — J3489 Other specified disorders of nose and nasal sinuses: Secondary | ICD-10-CM | POA: Diagnosis not present

## 2017-04-11 DIAGNOSIS — F419 Anxiety disorder, unspecified: Secondary | ICD-10-CM | POA: Diagnosis not present

## 2017-04-11 DIAGNOSIS — R278 Other lack of coordination: Secondary | ICD-10-CM | POA: Diagnosis not present

## 2017-04-11 DIAGNOSIS — E46 Unspecified protein-calorie malnutrition: Secondary | ICD-10-CM | POA: Diagnosis not present

## 2017-04-12 ENCOUNTER — Encounter: Payer: Self-pay | Admitting: Physician Assistant

## 2017-04-12 ENCOUNTER — Ambulatory Visit (INDEPENDENT_AMBULATORY_CARE_PROVIDER_SITE_OTHER): Payer: Medicare Other | Admitting: Physician Assistant

## 2017-04-12 VITALS — Ht 69.0 in | Wt 133.8 lb

## 2017-04-12 DIAGNOSIS — I10 Essential (primary) hypertension: Secondary | ICD-10-CM

## 2017-04-12 DIAGNOSIS — E7849 Other hyperlipidemia: Secondary | ICD-10-CM | POA: Diagnosis not present

## 2017-04-12 DIAGNOSIS — I2781 Cor pulmonale (chronic): Secondary | ICD-10-CM

## 2017-04-12 DIAGNOSIS — I200 Unstable angina: Secondary | ICD-10-CM

## 2017-04-12 DIAGNOSIS — I25709 Atherosclerosis of coronary artery bypass graft(s), unspecified, with unspecified angina pectoris: Secondary | ICD-10-CM | POA: Diagnosis not present

## 2017-04-12 DIAGNOSIS — I951 Orthostatic hypotension: Secondary | ICD-10-CM

## 2017-04-12 DIAGNOSIS — Z029 Encounter for administrative examinations, unspecified: Secondary | ICD-10-CM | POA: Diagnosis not present

## 2017-04-12 NOTE — Telephone Encounter (Signed)
Verbal okay by Dr [name] 04/12/17

## 2017-04-12 NOTE — Patient Instructions (Addendum)
Medication Instructions:  Your physician recommends that you continue on your current medications as directed. Please refer to the Current Medication list given to you today.   Labwork: none  Testing/Procedures: none  Follow-Up:  Your physician recommends that you schedule a follow-up appointment in: September with Dr. Tamala Julian   Any Other Special Instructions Will Be Listed Below (If Applicable).     If you need a refill on your cardiac medications before your next appointment, please call your pharmacy.

## 2017-04-12 NOTE — Progress Notes (Signed)
Cardiology Office Note    Date:  04/12/2017   ID:  [name], DOB [birth year], MRN [id number]  PCP:  [name], MD  Cardiologist:  Dr. Tamala Julian   Chief Complaint: Hospital follow up   History of Present Illness:   [name] is a 84 y.o. male with a hx of CABG 2003, HTN, COPD on steroids w/ chronic resp failure, cor pulmonale on 2 L oxygen and nocturnal ventilation, uses trilogy machine,hyperlipidemia, PAD, history of carotid obstructive disease and RBBB presents for hospital follow up.   [name] was admitted 01/27-02/01/2017 for Flu A and respiratory issues. Admitted 02/28-03/04 with CHF exacerbation, COPD also a little worse, palpitations w/ PACs on telemetry, weakness. MV intermediate risk, med rx, wt 126 lbs.  [name] was last by Dr Tamala Julian 03/21/17. He was doing better from a cardiac & respiratory standpoint. Wt 133 lbs.  Admitted 3/16-3/18 for chest pain. Felt like COPD flare. Added Ranexa.  Per telephone note 03/27/17, metoprolol reduced due to hypotension.  Admitted 3/27-3/28 for weakness and ? Tremors. MRI/EEG unremarkable. Stopped Ranexa as this might be contributing to his symptoms.   Here today for follow up.  Compliant with low-sodium diet.  His weight has been stable since last office visit.  He continues to have intermittent dizziness while standing.  Prior history of orthostatic.  He has burning chest pain that radiates left to right.  Started PPI 3 days ago but has not noticed any improvement.  He has chronic lower extremity edema and compliant with his compression stocking.  He does complains of intermittent fluttering sensation.  Past Medical History:  Diagnosis Date  . Arthritis    "generalized" (04/04/2017)  . Asthma   . Basal cell carcinoma    "left ear"  . BPH (benign prostatic hyperplasia)    severe; s/p multiple biopsies  . CAD (coronary artery disease)   . Carotid artery occlusion   . Chronic respiratory failure ([place])   . Chronic rhinitis   .  COPD (chronic obstructive pulmonary disease) ([facility])    2L [place] O2  . Diastolic heart failure ([place]) 2019  . Elevated troponin 03/09/2017  . GERD (gastroesophageal reflux disease)   . History of blood transfusion    "w/his CABG" (04/04/2017)  . History of kidney stones   . Hyperlipidemia   . Hypertension   . On home oxygen therapy    "~ 24/7" (04/04/2017)  . Peripheral vascular disease ([place])   . Pneumonia 2019  . Syncope and collapse   . Ureteral tumor 08/2015   had endoscopic procedure for evaluation, unable to reach for biopsy    Past Surgical History:  Procedure Laterality Date  . BASAL CELL CARCINOMA EXCISION Left    ear  . CARDIAC CATHETERIZATION     "prior to bypass"  . CATARACT EXTRACTION W/ INTRAOCULAR LENS  IMPLANT, BILATERAL Bilateral   . CORONARY ARTERY BYPASS GRAFT  10/2001   LIMA to LAD, SVG to OM1-2, SVG to RCA and PDA  . LITHOTRIPSY    . PROSTATE BIOPSY  Oct. 2014  . TONSILLECTOMY      Current Medications: Prior to Admission medications   Medication Sig Start Date End Date Taking? Authorizing Provider  ALPRAZolam (XANAX) 0.25 MG tablet Take half (1/2) tablet by mouth three (3) times daily.    [provider]  aspirin 81 MG tablet Take 81 mg by mouth daily.    [provider]  atorvastatin (LIPITOR) 20 MG tablet TAKE 1 TABLET  BY MOUTH EVERY DAY Patient taking differently: TAKE 1 TABLET (20mg ) BY MOUTH EVERY DAY 05/24/16   Belva Crome, MD  b complex vitamins capsule Take 1 capsule by mouth daily.    [provider]  Cholecalciferol (VITAMIN D3) 2000 units TABS Take 2,000 Units by mouth daily.    [provider]  Cyanocobalamin (VITAMIN B 12 PO) Take 1 tablet daily by mouth.     [provider]  feeding supplement, ENSURE ENLIVE, (ENSURE ENLIVE) LIQD Take 237 mLs by mouth 2 (two) times daily between meals. 04/05/17   Geradine Girt, DO  finasteride (PROSCAR) 5 MG tablet Take 5 mg by mouth every morning.    [provider]  furosemide (LASIX) 20 MG tablet Take 1 tablet (20 mg total) by mouth daily. Can take one additional pill if needed for swelling. 02/27/17   Tanda Rockers, MD  guaiFENesin (MUCINEX) 600 MG 12 hr tablet Take 600 mg by mouth 2 (two) times daily as needed for cough.     [provider]  metoprolol succinate (TOPROL-XL) 25 MG 24 hr tablet Take 0.5 tablets (12.5 mg total) by mouth daily. 03/28/17   Belva Crome, MD  mometasone (NASONEX) 50 MCG/ACT nasal spray SHAKE LIQUID AND USE 2 SPRAYS IN [facility] NOSTRIL DAILY 12/25/16   Tanda Rockers, MD  Multiple Vitamins-Minerals (PRESERVISION AREDS 2 PO) Take 1 capsule by mouth daily.    [provider]  Multiple Vitamins-Minerals (PRESERVISION AREDS 2) CAPS Take 1 capsule by mouth daily.    [provider]  OXYGEN Inhale 2 L continuous into the lungs.     [provider]  oxymetazoline (AFRIN) 0.05 % nasal spray Place 1 spray into both nostrils 2 (two) times daily as needed for congestion. 03/26/17   Georgette Shell, MD  polyethylene glycol [facility] / Floria Raveling) packet Take 17 g by mouth 2 (two) times daily as needed for mild constipation.    [provider]  predniSONE (DELTASONE) 10 MG tablet Take 10 mg by mouth daily with breakfast.    [provider]  ranitidine (ZANTAC) 150 MG tablet Take 150 mg by mouth 2 (two) times daily. 03/21/17   [provider]  sodium chloride (MURO 128) 2 % ophthalmic solution Place 1 drop into both eyes every 4 (four) hours as needed for eye irritation.    [provider]  Sodium Chloride, Hypertonic, (MURO 128 OP) Place 1 drop 4 (four) times daily into both eyes.    [provider]  SPIRIVA RESPIMAT 2.5 MCG/ACT AERS Inhale 2 puffs into the lungs daily. 03/05/17   [provider]  SYMBICORT 160-4.5 MCG/ACT inhaler INHALE 2 PUFFS BY MOUTH EVERY MORNING AND THEN ANOTHER 2 PUFFS ABOUT 12 HOURS LATER 11/15/16   Tanda Rockers, MD    XOPENEX HFA 45 MCG/ACT inhaler INHALE 2 PUFFS BY MOUTH EVERY 4 HOURS AS NEEDED FOR WHEEZING OR SHORTNESS OF BREATH 10/05/16   Tanda Rockers, MD    Allergies:   Cefdinir; Nitrofurantoin; Sulfa antibiotics; Sulfonamide derivatives; Cefdinir; Ciprofloxacin; Nitrofurantoin; Ciprofloxacin; Levaquin [levofloxacin]; and Levaquin [levofloxacin]   Social History   Socioeconomic History  . Marital status: Married    Spouse name: Not on file  . Number of children: 2  . Years of education: Not on file  . Highest education level: Not on file  Occupational History  . 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 Financial resource strain: Not on  file  . Food insecurity:    Worry: Not on file    Inability: Not on file  . Transportation needs:    Medical: Not on file    Non-medical: Not on file  Tobacco Use  . Smoking status: Former Smoker    Packs/day: 2.00    Years: 33.00    Pack years: 66.00    Types: Cigarettes    Last attempt to quit: 03/25/1982    Years since quitting: 35.0  . Smokeless tobacco: Never Used  Substance and Sexual Activity  . Alcohol use: Yes    Alcohol/week: 0.0 oz    Comment: 1.5-3 oz daily in the past, minimal use in the last few months  . Drug use: No  . Sexual activity: Not on file  Lifestyle  . Physical activity:    Days per week: Not on file    Minutes per session: Not on file  . Stress: Not on file  Relationships  . Social connections:    Talks on phone: Not on file    Gets together: Not on file    Attends religious service: Not on file    Active member of club or organization: Not on file    Attends meetings of clubs or organizations: Not on file    Relationship status: Not on file  Other Topics Concern  . Not on file  Social History Narrative   ** Merged History Encounter **       Pt lives at home with his spouse.           Family History:  The patient's family history includes Allergies in his  brother; Breast cancer in his mother; Cancer in his mother; Heart attack in his father; Heart disease in his brother, brother, father, and mother; Hyperlipidemia in his brother; Hypertension in his brother and mother; Other in his mother; Peripheral vascular disease in his brother; Stroke in his brother.   ROS:   Please see the history of present illness.    ROS All other systems reviewed and are negative.   PHYSICAL EXAM:   VS:  Ht 5\' 9"  (1.753 m)   Wt 133 lb 12.8 oz (60.7 kg)   SpO2 97%   BMI 19.76 kg/m    GEN: Thin frail elderly male on supplemental oxygen. HEENT: normal  Neck: no JVD, carotid bruits, or masses Cardiac: RRR; no murmurs, rubs, or gallops, 1+ bilateral lower extremity edema with compression stocking Respiratory:  clear to auscultation bilaterally, normal work of breathing GI: soft, nontender, nondistended, + BS MS: no deformity or atrophy  Skin: warm and dry, no rash Neuro:  Alert and Oriented x 3, Strength and sensation are intact Psych: euthymic mood, full affect  Wt Readings from Last 3 Encounters:  04/12/17 133 lb 12.8 oz (60.7 kg)  04/04/17 130 lb 1.1 oz (59 kg)  03/26/17 126 lb 1.6 oz (57.2 kg)      Studies/Labs Reviewed:   EKG:  EKG is not ordered today.    Recent Labs: 03/08/2017: TSH 1.851 03/21/2017: NT-Pro BNP 1,372 03/24/2017: B Natriuretic Peptide 190.3 04/04/2017: ALT 20; Magnesium 2.4 04/05/2017: BUN 14; Creatinine, Ser 1.01; Hemoglobin 11.5; Platelets 193; Potassium 4.0; Sodium 138   Lipid Panel    Component Value Date/Time   CHOL 146 03/26/2017 0621   TRIG 34 03/26/2017 0621   HDL 79 03/26/2017 0621   CHOLHDL 1.8 03/26/2017 0621   VLDL 7 03/26/2017 0621   LDLCALC 60 03/26/2017 0621    Additional studies/ records  that were reviewed today include:   Echocardiogram: 03/09/17 Study Conclusions  - Left ventricle: The cavity size was normal. Systolic function was   normal. The estimated ejection fraction was in the range of 60%   to  65%. Wall motion was normal; there were no regional wall   motion abnormalities. Doppler parameters are consistent with   abnormal left ventricular relaxation (grade 1 diastolic   dysfunction). - Aortic valve: Transvalvular velocity was within the normal range.   There was no stenosis. There was no regurgitation. - Mitral valve: Transvalvular velocity was within the normal range.   There was no evidence for stenosis. There was trivial   regurgitation. - Right ventricle: The cavity size was normal. Wall thickness was   normal. Systolic function was reduced. - Tricuspid valve: There was mild regurgitation. - Pulmonary arteries: Systolic pressure was within the normal   range. PA peak pressure: 24 mm Hg (S). - Pericardium, extracardiac: A trivial pericardial effusion was   identified.   Myoview 03/12/17 IMPRESSION: 1. Large infarct involving the inferior wall including the inferior apex. Difficult to exclude peri-infarct ischemia at the apex and recommend correlation with ECG testing.  2. Abnormal wall motion along the inferior wall base and septal wall as described.  3. Left ventricular ejection fraction is 48%.  4. Non invasive risk stratification*: Intermediate  ASSESSMENT & PLAN:    1. Chronic diastolic CHF - he has chronic lower extremity edema.  He is compliant with low-sodium diet and compression stocking.  Weight has been stable.  Continue Lasix at current dose.  2. CAD s/p CABG - Did not tolerated Ranexa.  Echocardiogram and stress test as above.  mixed type pain. Unable to recall his prior angina pain. Did not requiring any nitro. Reviewed with Dr. Tamala Julian.  Some of his symptoms might be coming from ischemia.  However, he is not a candidate of for any invasive evaluation given end-stage lung disease. -Continue aspirin 81 mg, Lipitor 20 mg and Toprol-XL 12.5 mg daily. - Continue PPI. A  3.  Orthostatic hypotension with hx of HTN Orthostatic VS for the past 24 hrs:   BP- Lying Pulse- Lying BP- Sitting Pulse- Sitting BP- Standing at 0 minutes Pulse- Standing at 0 minutes  04/12/17 1200 144/72 94 133/68 101 109/62 100   -Difficult situation.  Continue compression stocking.  Advised to get balance before movement.  Encouraged to use walker at home (not using).  We will continue low-dose beta-blocker.  4.  Cor pulmonale with COPD -Compliant with oxygen use.  Encourage exercise as he can tolerate.  5. HLD - 03/26/2017: Cholesterol 146; HDL 79; LDL Cholesterol 60; Triglycerides 34; VLDL 7  - Continue stain.    Medication Adjustments/Labs and Tests Ordered: Current medicines are reviewed at length with the patient today.  Concerns regarding medicines are outlined above.  Medication changes, Labs and Tests ordered today are listed in the Patient Instructions below. Patient Instructions  Medication Instructions:  Your physician recommends that you continue on your current medications as directed. Please refer to the Current Medication list given to you today.   Labwork: none  Testing/Procedures: none  Follow-Up:  Your physician recommends that you schedule a follow-up appointment in: September with Dr. Tamala Julian   Any Other Special Instructions Will Be Listed Below (If Applicable).     If you need a refill on your cardiac medications before your next appointment, please call your pharmacy.      Jarrett Soho, [place]  04/12/2017 12:29 PM  [facility] [place], [place], [place]  [address] Phone: [phone]; Fax: [phone]

## 2017-04-13 ENCOUNTER — Other Ambulatory Visit: Payer: Self-pay | Admitting: Internal Medicine

## 2017-04-13 ENCOUNTER — Telehealth: Payer: Self-pay | Admitting: Internal Medicine

## 2017-04-13 ENCOUNTER — Other Ambulatory Visit: Payer: Self-pay | Admitting: *Deleted

## 2017-04-13 MED ORDER — DOXYCYCLINE HYCLATE 100 MG PO TABS: 100.0000 mg | ORAL_TABLET | Freq: Two times a day (BID) | ORAL | 0 refills | Status: DC

## 2017-04-13 NOTE — Patient Outreach (Signed)
[facility]) Care Management  04/13/2017  [name] 09-Jan-1930 [id number]    Transition of care (Successful)   Rn spoke with pt today with transition of care update. Pt reports recent infection with "greenish" color sputum and contacted his provider who prescribed a Z-pack followed by doxycycline. Pt reports he was using his nebs and inhalers for his COPD but when he had a flare up he quickly contacted his provider for medications. Pt state he is trying to avoid going back to the hospital. RN stress the importance of acting quickly with is COPD symptoms and praised pt for his efforts. Pt states today his sputum is clear and he only has some SOB upon exertion and activities but can confirm he is in the GREEN zone when review of the COPD action zone. Pt confirms he is taking all his prescribed medication and attending all medical appointments with sufficient transportation. RN discussed his current plan of care, goal and adjusted interventions accordingly. Will also address pt's COPD and reiterated on prevention measures. RN continue to offer home visits to address his needs further however pt remains receptive to the ongoing transition of care call. RN also reiterated on Palliative care for further consideration if interested and offered a personal consult with a representative from that agency to detail services and benefits. Pt with a clear understanding and again does not wish to consider this at this time. Will scheduled the next follow up next week and continue transition of care calls as agreed.  [name], RN Care Management Coordinator [place] Office [phone]

## 2017-04-13 NOTE — Telephone Encounter (Signed)
That's fine - it's a very low dose and might benefit from the 0.5 strength anyway with sig:  Take one half to one every 6 hours as needed   # 120 fine to give with new rx when it's time to refill it anyway

## 2017-04-13 NOTE — Telephone Encounter (Signed)
Follow the instructions at bottom of med calendar and try doxy 100 mg twice daily x 10 days (allergies to most other abx) .  As long as can get comfortable at rest or on vent at hs nothing else to consider for now

## 2017-04-13 NOTE — Telephone Encounter (Signed)
Called and spoke with patient regarding MW response and recommendations Pt verbalized understanding and had no further questions or concerns Nothing further needed at this time

## 2017-04-13 NOTE — Telephone Encounter (Signed)
Called and spoke with pt letting him know we were going to send a script of doxy in to his pharmacy.  Pt asked me if it would be fine for him to take an extra xanax, if that would do anything to his breathing.  Dr. [name], please advise if you think it would be fine for pt to take an extra xanax?

## 2017-04-13 NOTE — Telephone Encounter (Signed)
Called and spoke with pt who stated he is on day 5 of his zpak abx and he states he is not feeling any better.  Pt states when he walks about 6-8 steps while using his POC, he becomes very SOB and he also develops dizziness.  Pt also has c/o cough with occ thick yellow mucus.  Dr. [name], please advise on recommendations for pt. Thanks!

## 2017-04-13 NOTE — Telephone Encounter (Signed)
rx has been sent in for doxy for pt.  Called pt letting him know this was taken care of.  Pt expressed understanding. Nothing further needed at this time.

## 2017-04-14 DIAGNOSIS — Z87891 Personal history of nicotine dependence: Secondary | ICD-10-CM | POA: Diagnosis not present

## 2017-04-14 DIAGNOSIS — E785 Hyperlipidemia, unspecified: Secondary | ICD-10-CM | POA: Diagnosis not present

## 2017-04-14 DIAGNOSIS — I739 Peripheral vascular disease, unspecified: Secondary | ICD-10-CM | POA: Diagnosis not present

## 2017-04-14 DIAGNOSIS — J9612 Chronic respiratory failure with hypercapnia: Secondary | ICD-10-CM | POA: Diagnosis not present

## 2017-04-14 DIAGNOSIS — I2510 Atherosclerotic heart disease of native coronary artery without angina pectoris: Secondary | ICD-10-CM | POA: Diagnosis not present

## 2017-04-14 DIAGNOSIS — K219 Gastro-esophageal reflux disease without esophagitis: Secondary | ICD-10-CM | POA: Diagnosis not present

## 2017-04-14 DIAGNOSIS — J441 Chronic obstructive pulmonary disease with (acute) exacerbation: Secondary | ICD-10-CM | POA: Diagnosis not present

## 2017-04-14 DIAGNOSIS — Z951 Presence of aortocoronary bypass graft: Secondary | ICD-10-CM | POA: Diagnosis not present

## 2017-04-14 DIAGNOSIS — I10 Essential (primary) hypertension: Secondary | ICD-10-CM | POA: Diagnosis not present

## 2017-04-14 DIAGNOSIS — Z7952 Long term (current) use of systemic steroids: Secondary | ICD-10-CM | POA: Diagnosis not present

## 2017-04-14 DIAGNOSIS — Z7951 Long term (current) use of inhaled steroids: Secondary | ICD-10-CM | POA: Diagnosis not present

## 2017-04-14 DIAGNOSIS — N400 Enlarged prostate without lower urinary tract symptoms: Secondary | ICD-10-CM | POA: Diagnosis not present

## 2017-04-14 DIAGNOSIS — Z7982 Long term (current) use of aspirin: Secondary | ICD-10-CM | POA: Diagnosis not present

## 2017-04-14 DIAGNOSIS — J9611 Chronic respiratory failure with hypoxia: Secondary | ICD-10-CM | POA: Diagnosis not present

## 2017-04-17 DIAGNOSIS — H6122 Impacted cerumen, left ear: Secondary | ICD-10-CM | POA: Diagnosis not present

## 2017-04-17 DIAGNOSIS — J320 Chronic maxillary sinusitis: Secondary | ICD-10-CM | POA: Diagnosis not present

## 2017-04-17 DIAGNOSIS — J40 Bronchitis, not specified as acute or chronic: Secondary | ICD-10-CM | POA: Diagnosis not present

## 2017-04-17 DIAGNOSIS — J322 Chronic ethmoidal sinusitis: Secondary | ICD-10-CM | POA: Diagnosis not present

## 2017-04-18 DIAGNOSIS — J9612 Chronic respiratory failure with hypercapnia: Secondary | ICD-10-CM | POA: Diagnosis not present

## 2017-04-18 DIAGNOSIS — J441 Chronic obstructive pulmonary disease with (acute) exacerbation: Secondary | ICD-10-CM | POA: Diagnosis not present

## 2017-04-18 DIAGNOSIS — I2510 Atherosclerotic heart disease of native coronary artery without angina pectoris: Secondary | ICD-10-CM | POA: Diagnosis not present

## 2017-04-18 DIAGNOSIS — I10 Essential (primary) hypertension: Secondary | ICD-10-CM | POA: Diagnosis not present

## 2017-04-18 DIAGNOSIS — I739 Peripheral vascular disease, unspecified: Secondary | ICD-10-CM | POA: Diagnosis not present

## 2017-04-18 DIAGNOSIS — J9611 Chronic respiratory failure with hypoxia: Secondary | ICD-10-CM | POA: Diagnosis not present

## 2017-04-19 ENCOUNTER — Encounter: Payer: Self-pay | Admitting: Adult Health

## 2017-04-19 ENCOUNTER — Other Ambulatory Visit: Payer: Self-pay | Admitting: *Deleted

## 2017-04-19 ENCOUNTER — Ambulatory Visit (INDEPENDENT_AMBULATORY_CARE_PROVIDER_SITE_OTHER): Payer: Medicare Other | Admitting: Adult Health

## 2017-04-19 DIAGNOSIS — J9612 Chronic respiratory failure with hypercapnia: Secondary | ICD-10-CM

## 2017-04-19 DIAGNOSIS — J9611 Chronic respiratory failure with hypoxia: Secondary | ICD-10-CM | POA: Diagnosis not present

## 2017-04-19 DIAGNOSIS — J431 Panlobular emphysema: Secondary | ICD-10-CM

## 2017-04-19 DIAGNOSIS — I5032 Chronic diastolic (congestive) heart failure: Secondary | ICD-10-CM | POA: Diagnosis not present

## 2017-04-19 NOTE — Progress Notes (Signed)
@Patient  ID: [name], male    DOB: 08/31/1929, 84 y.o.   MRN: [id number]  Chief Complaint  Patient presents with  . Follow-up    COPD     Referring provider: [name], MD  HPI: 84yo male former smoker (1983) followed for GOLD III COPD w/ asthma component , O2 dependent RF with act and At bedtime    TEST /Events  PFTs 05/04/05 FEV1 36% ratio 28% with 29% improvement after bronchodilators DLCO 48%  - PFT's 03/26/08 30% ratio 34 with 14% improvement after bronchodilators DLC0 38 %  - Referred to rehab 12/19/2012 >completed March 2015  - changed to spiriva respimat 02/14/2013  - started daily prednisone 06/19/13 >improved only a little>tapered off mid July 2015 >ok to change to prn prednisone 08/27/2013  - flared off nasonex and gerd rx 09/21/2014 > resumed - 10/19/2014 p extensive coaching HFA effectiveness = 90%  - changed pred to ceiling of 20 / floor of 5 mg daily as of 10/19/2014 >changed to floor of 10 mg daily 10/04/2015  - PFT's 06/08/2015 FEV1 0.58 (22 % ) ratio 27 p No sign % improvement from saba p symb/spiriva prior to study with DLCO 9/9 % corrects to 27 % for alv volume  Start noct 02 at 2lpm 2008 and on 03/04/09 desat @ >185 ft so rec wear with activtiy >rm to rm  - 2/6/2013Walked RA x one lap @ 185 stopped due to desat >corrected on 2lpm - 02/26/2014 Walked 2lpm 2 laps @ 185 ft each stopped due to Sob/ sats 88% at nl pace  - 10/10/2016Walked RA 2 laps @ 185 ft each stopped due to End of study, slow pace, sob But no desat  - HCO3 10/27/15 = 34   04/19/2017 Follow up ; COPD , O2 RF  Patient returns for a 39-month follow-up. Says he has been doing down hill over last 2-3 months after several admissions to hospital . Says he is very weak, wears out easily with dyspnea. PT is coming to house. Trying to get stronger.   Had increased cough and congestion with green mucus last week , started Doxycycline and this has helped. Has 3 days  left of this.  Patient has been admitted 4 times over the last 2 months.  For Influenza , unstable angina,  heart failure and medication side effects. We discussed end of life discussion , pt wishes for full maximum care . He declines palliative care/hospice referral . Discussed severity of his COPD in detail.  He denies any chest pain, abd pain , n/v/d, or fever.  Has chronic leg swelling , no change .      Allergies  Allergen Reactions  . Cefdinir Other (See Comments)    Severe Diarrhea  . Nitrofurantoin Swelling    Hand Swelling  . Sulfa Antibiotics Anaphylaxis and Swelling  . Sulfonamide Derivatives Swelling    REACTION: facial/tongue swelling  . Cefdinir Diarrhea    REACTION: diarrhea  . Ciprofloxacin Itching and Rash    Red itchy hands  . Nitrofurantoin Swelling    Swollen hands  . Ciprofloxacin Itching and Rash  . Levaquin [Levofloxacin] Itching and Rash  . Levaquin [Levofloxacin] Itching and Rash    Immunization History  Administered Date(s) Administered  . Influenza Split 10/10/2010, 10/04/2011, 10/14/2013  . Influenza Whole 10/01/2007, 11/06/2008, 11/09/2009  . Influenza, High Dose Seasonal PF 11/07/2016  . Influenza,inj,Quad PF,6+ Mos 09/30/2012, 10/19/2014  . Influenza-Unspecified 09/07/2015  . Pneumococcal Conjugate-13 08/27/2013  . Pneumococcal  Polysaccharide-23 12/11/2001, 05/20/2015    Past Medical History:  Diagnosis Date  . Arthritis    "generalized" (04/04/2017)  . Asthma   . Basal cell carcinoma    "left ear"  . BPH (benign prostatic hyperplasia)    severe; s/p multiple biopsies  . CAD (coronary artery disease)   . Carotid artery occlusion   . Chronic respiratory failure ([place])   . Chronic rhinitis   . COPD (chronic obstructive pulmonary disease) (HCC)    2L [place] O2  . Diastolic heart failure ([place]) 2019  . Elevated troponin 03/09/2017  . GERD (gastroesophageal reflux disease)   . History of blood transfusion    "w/his CABG" (04/04/2017)  .  History of kidney stones   . Hyperlipidemia   . Hypertension   . On home oxygen therapy    "~ 24/7" (04/04/2017)  . Peripheral vascular disease ([place])   . Pneumonia 2019  . Syncope and collapse   . Ureteral tumor 08/2015   had endoscopic procedure for evaluation, unable to reach for biopsy    Tobacco History: Social History   Tobacco Use  Smoking Status Former Smoker  . Packs/day: 2.00  . Years: 33.00  . Pack years: 66.00  . Types: Cigarettes  . Last attempt to quit: 03/25/1982  . Years since quitting: 35.0  Smokeless Tobacco Never Used   Counseling given: Not Answered   Outpatient Encounter Medications as of 04/19/2017  Medication Sig  . ALPRAZolam (XANAX) 0.25 MG tablet Take half (1/2) tablet by mouth three (3) times daily.  Marland Kitchen aspirin 81 MG tablet Take 81 mg by mouth daily.  Marland Kitchen atorvastatin (LIPITOR) 20 MG tablet TAKE 1 TABLET BY MOUTH EVERY DAY  . Cholecalciferol (VITAMIN D3) 2000 units TABS Take 2,000 Units by mouth daily.  . Cyanocobalamin (VITAMIN B 12 PO) Take 1 tablet daily by mouth.   . doxycycline (VIBRA-TABS) 100 MG tablet Take 1 tablet (100 mg total) by mouth 2 (two) times daily.  . finasteride (PROSCAR) 5 MG tablet Take 5 mg by mouth every morning.  . furosemide (LASIX) 20 MG tablet Take 1 tablet (20 mg total) by mouth daily. Can take one additional pill if needed for swelling.  Marland Kitchen guaiFENesin (MUCINEX) 600 MG 12 hr tablet Take 600 mg by mouth 2 (two) times daily as needed for cough.   . levalbuterol (XOPENEX) 0.63 MG/3ML nebulizer solution USE ONE VIAL VIA NEBULIZER EVERY 4 HOURS AS NEEDED FOR WHEEZING OR SHORTNESS OF BREATH  . metoprolol succinate (TOPROL-XL) 25 MG 24 hr tablet Take 0.5 tablets (12.5 mg total) by mouth daily.  . mometasone (NASONEX) 50 MCG/ACT nasal spray SHAKE LIQUID AND USE 2 SPRAYS IN EACH NOSTRIL DAILY  . Multiple Vitamins-Minerals (PRESERVISION AREDS 2) CAPS Take 1 capsule by mouth daily.  . OXYGEN Inhale 2 L continuous into the lungs.   Marland Kitchen  oxymetazoline (AFRIN) 0.05 % nasal spray Place 1 spray into both nostrils 2 (two) times daily as needed for congestion.  . predniSONE (DELTASONE) 10 MG tablet Take 10 mg by mouth daily with breakfast.  . predniSONE (DELTASONE) 10 MG tablet Take 1-2 tablets (10-20 mg total) by mouth daily with breakfast.  . ranitidine (ZANTAC) 150 MG tablet Take 150 mg by mouth 2 (two) times daily.  . sodium chloride (MURO 128) 2 % ophthalmic solution Place 1 drop into both eyes every 4 (four) hours as needed for eye irritation.  Marland Kitchen SPIRIVA RESPIMAT 2.5 MCG/ACT AERS Inhale 2 puffs into the lungs daily.  . SYMBICORT 160-4.5  MCG/ACT inhaler INHALE 2 PUFFS BY MOUTH EVERY MORNING AND THEN ANOTHER 2 PUFFS ABOUT 12 HOURS LATER  . XOPENEX HFA 45 MCG/ACT inhaler INHALE 2 PUFFS BY MOUTH EVERY 4 HOURS AS NEEDED FOR WHEEZING OR SHORTNESS OF BREATH   No facility-administered encounter medications on file as of 04/19/2017.      Review of Systems  Constitutional:   No  weight loss, night sweats,  Fevers, chills,  +fatigue, or  lassitude.  HEENT:   No headaches,  Difficulty swallowing,  Tooth/dental problems, or  Sore throat,                No sneezing, itching, ear ache, + nasal congestion, post nasal drip,   CV:  No chest pain,  Orthopnea, PND, swelling in lower extremities, anasarca, dizziness, palpitations, syncope.   GI  No heartburn, indigestion, abdominal pain, nausea, vomiting, diarrhea, change in bowel habits, loss of appetite, bloody stools.   Resp:    No chest wall deformity  Skin: no rash or lesions.  GU: no dysuria, change in color of urine, no urgency or frequency.  No flank pain, no hematuria   MS:  No joint pain or swelling.  No decreased range of motion.  No back pain.    Physical Exam  BP 124/64 (BP Location: Left Arm, Cuff Size: Normal)   Pulse 100   Ht 5\' 9"  (1.753 m)   Wt 132 lb 3.2 oz (60 kg)   SpO2 95%   BMI 19.52 kg/m   GEN: A/Ox3; pleasant , NAD, chronically ill-appearing, on  oxygen, thin and cachectic   HEENT:  [place]/AT,  EACs-clear, TMs-wnl, NOSE-clear, THROAT-clear, no lesions, no postnasal drip or exudate noted.   NECK:  Supple w/ fair ROM; no JVD; normal carotid impulses w/o bruits; no thyromegaly or nodules palpated; no lymphadenopathy.    RESP decreased breath sounds in the bases  no accessory muscle use, no dullness to percussion  CARD:  RRR, no m/r/g, 1-2 +  peripheral edema, pulses intact, no cyanosis or clubbing.  GI:   Soft & nt; nml bowel sounds; no organomegaly or masses detected.   Musco: Warm bil, no deformities or joint swelling noted.   Neuro: alert, no focal deficits noted.    Skin: Warm, no lesions or rashes    Lab Results:  CBC   BNP  Imaging: Dg Chest 2 View  Result Date: 03/24/2017 CLINICAL DATA:  Chest pain. EXAM: CHEST - 2 VIEW COMPARISON:  Radiographs and CT 03/08/2017 FINDINGS: Post median sternotomy. Chronic hyperinflation and emphysema. Normal heart size and mediastinal contours. No pulmonary edema, pleural effusion or pneumothorax. No focal airspace disease. Degenerative change of both shoulders. IMPRESSION: 1. No acute abnormality. 2. Emphysema and chronic hyperinflation, unchanged. Electronically Signed   By: Jeb Levering M.D.   On: 03/24/2017 21:46   Mr Brain Wo Contrast  Result Date: 04/04/2017 CLINICAL DATA:  TIA.  Lower extremity weakness and slurred speech EXAM: MRI HEAD WITHOUT CONTRAST TECHNIQUE: Multiplanar, multiecho pulse sequences of the brain and surrounding structures were obtained without intravenous contrast. COMPARISON:  CT head 04/04/2017 FINDINGS: Brain: Negative for acute infarct. Mild chronic microvascular ischemic change in the white matter. Generalized cortical atrophy. Negative for hydrocephalus. Negative for hemorrhage or mass. No edema or midline shift. Vascular: Normal arterial flow voids Skull and upper cervical spine: Negative Sinuses/Orbits: Mild mucosal edema paranasal sinuses. Bilateral  cataract removal Other: None IMPRESSION: No acute abnormality. Atrophy and mild chronic microvascular ischemia. Electronically Signed   By: Juanda Crumble  Carlis Abbott M.D.   On: 04/04/2017 13:20   Portable Chest 1 View  Result Date: 04/04/2017 CLINICAL DATA:  Shortness of Breath EXAM: PORTABLE CHEST 1 VIEW COMPARISON:  March 24, 2017 FINDINGS: Lungs are hyperexpanded. There is no edema or consolidation. There is mild bibasilar scarring. The heart size and pulmonary vascularity are normal. No adenopathy. There is aortic atherosclerosis. Patient is status post coronary artery bypass grafting. There is degenerative change in each shoulder. IMPRESSION: Lungs hyperexpanded without edema or consolidation. Mild bibasilar scarring. Heart size normal. There is aortic atherosclerosis. Aortic Atherosclerosis (ICD10-I70.0). Electronically Signed   By: Lowella Grip III M.D.   On: 04/04/2017 11:26   Ct Head Code Stroke Wo Contrast  Result Date: 04/04/2017 CLINICAL DATA:  Code stroke. 84 year old male with speech deficit and decreased coordination. Last seen normal 2300 hours. EXAM: CT HEAD WITHOUT CONTRAST TECHNIQUE: Contiguous axial images were obtained from the base of the skull through the vertex without intravenous contrast. COMPARISON:  [facility] head CT without contrast 02/02/2016. Brain MRI and intracranial MRA 07/13/2012. FINDINGS: Brain: No acute intracranial hemorrhage identified. No midline shift, mass effect, or evidence of intracranial mass lesion. No ventriculomegaly. Cerebral volume and gray-white matter differentiation are stable and within normal limits for age. No cortically based acute infarct identified. No cortical encephalomalacia identified. Vascular: Calcified atherosclerosis at the skull base. No suspicious intracranial vascular hyperdensity. Skull: No acute osseous abnormality identified. Sinuses/Orbits: Trace fluid in the sphenoid sinuses, otherwise stable and well pneumatized. Other: No acute  orbit or scalp soft tissue finding. ASPECTS ([place] Stroke Program Early CT Score) - Ganglionic level infarction (caudate, lentiform nuclei, internal capsule, insula, M1-M3 cortex): 7 - Supraganglionic infarction (M4-M6 cortex): 3 Total score (0-10 with 10 being normal): 10 IMPRESSION: 1. Stable and normal for age noncontrast CT appearance of the brain. 2. ASPECTS is 10. 3. These results were communicated to Dr. Cheral Marker at 1:17 amon 3/27/2019by text page via the [facility] messaging system. Electronically Signed   By: Genevie Ann M.D.   On: 04/04/2017 01:18     Assessment & Plan:   Chronic obstructive pulmonary disease ([place]) Very severe COPD with frequent exacerbations Now resolving from recent flare.  Patient is to finish antibiotics and taper prednisone down to baseline steroids at prednisone 10 mg daily.  Plan  Patient Instructions  Follow med calendar closely and bring to each visit.  Finish Doxycycline as directed.  Taper prednisone and hold at 10mg  daily  Continue on Oxygen 2l/m  Continue on Trilogy Vent .At bedtime   Please contact office for sooner follow up if symptoms do not improve or worsen or seek emergency care   Follow up Dr. [name]  In 6 weeks  and As needed.      Chronic respiratory failure with hypoxia and hypercapnia (HCC) Cont on O2  Cont on Trilogy vent At bedtime    Chronic diastolic CHF (congestive heart failure) ([place]) Compensated on present regimen .      Rexene Edison, NP 04/19/2017

## 2017-04-19 NOTE — Assessment & Plan Note (Signed)
Cont on O2  Cont on Trilogy vent At bedtime

## 2017-04-19 NOTE — Assessment & Plan Note (Signed)
Very severe COPD with frequent exacerbations Now resolving from recent flare.  Patient is to finish antibiotics and taper prednisone down to baseline steroids at prednisone 10 mg daily.  Plan  Patient Instructions  Follow med calendar closely and bring to each visit.  Finish Doxycycline as directed.  Taper prednisone and hold at 10mg  daily  Continue on Oxygen 2l/m  Continue on Trilogy Vent .At bedtime   Please contact office for sooner follow up if symptoms do not improve or worsen or seek emergency care   Follow up Dr. [name]  In 6 weeks  and As needed.

## 2017-04-19 NOTE — Patient Outreach (Signed)
[facility]) Care Management  04/19/2017  RAMEZ ARRONA 07-23-1929 [id number]    Transition of care (successful)  RN spoke with pt today and inquired on his ongoing management of care. Pt reports he continues his antiboitic regimen currently on doxycycline and will follow up today with Dr. [name] (pulmonologist) to review this regimen and an exam of his ongoing symptoms (congestion). Discussed both pt's HF and COPD related to his plan of care along with interventions that were adjusted accordingly to allow pt ahderence. Pt reports his weights are stable weighing around 126-127.3 lbs over the last week however he continues to have some swelling better in the morning related to his HF no other symptoms at this time Pt inquired on COPD symptoms verses the symptoms he is currently experiencing with his ongoing congestion. RN discussed the seasonal allergies (pollen) with some of the same symptoms that may be exhibited. RN discussed OTC Mucinex and Claritin/Zyrtecs as medication that can be used to assist with some of his symptoms however pt will be visiting his pulmonologist today and RN has requested pt to check with his provider for using any medication base upon his symptoms. No other inquires or request as pt very grateful for the information that was provider. Will follow up next week with the ongoing transition of care contact.  [name], RN Care Management Coordinator [place] Office [phone]

## 2017-04-19 NOTE — Progress Notes (Signed)
Chart and office note reviewed in detail  > agree with a/p as outlined    

## 2017-04-19 NOTE — Patient Instructions (Signed)
Follow med calendar closely and bring to each visit.  Finish Doxycycline as directed.  Taper prednisone and hold at 10mg  daily  Continue on Oxygen 2l/m  Continue on Trilogy Vent .At bedtime   Please contact office for sooner follow up if symptoms do not improve or worsen or seek emergency care   Follow up Dr. [name]  In 6 weeks  and As needed.

## 2017-04-19 NOTE — Assessment & Plan Note (Signed)
Compensated on present regimen.   

## 2017-04-20 DIAGNOSIS — J9611 Chronic respiratory failure with hypoxia: Secondary | ICD-10-CM | POA: Diagnosis not present

## 2017-04-20 DIAGNOSIS — J441 Chronic obstructive pulmonary disease with (acute) exacerbation: Secondary | ICD-10-CM | POA: Diagnosis not present

## 2017-04-20 DIAGNOSIS — I10 Essential (primary) hypertension: Secondary | ICD-10-CM | POA: Diagnosis not present

## 2017-04-20 DIAGNOSIS — I739 Peripheral vascular disease, unspecified: Secondary | ICD-10-CM | POA: Diagnosis not present

## 2017-04-20 DIAGNOSIS — J9612 Chronic respiratory failure with hypercapnia: Secondary | ICD-10-CM | POA: Diagnosis not present

## 2017-04-20 DIAGNOSIS — I2510 Atherosclerotic heart disease of native coronary artery without angina pectoris: Secondary | ICD-10-CM | POA: Diagnosis not present

## 2017-04-23 DIAGNOSIS — I739 Peripheral vascular disease, unspecified: Secondary | ICD-10-CM | POA: Diagnosis not present

## 2017-04-23 DIAGNOSIS — J441 Chronic obstructive pulmonary disease with (acute) exacerbation: Secondary | ICD-10-CM | POA: Diagnosis not present

## 2017-04-23 DIAGNOSIS — I10 Essential (primary) hypertension: Secondary | ICD-10-CM | POA: Diagnosis not present

## 2017-04-23 DIAGNOSIS — I2510 Atherosclerotic heart disease of native coronary artery without angina pectoris: Secondary | ICD-10-CM | POA: Diagnosis not present

## 2017-04-23 DIAGNOSIS — J9611 Chronic respiratory failure with hypoxia: Secondary | ICD-10-CM | POA: Diagnosis not present

## 2017-04-23 DIAGNOSIS — J9612 Chronic respiratory failure with hypercapnia: Secondary | ICD-10-CM | POA: Diagnosis not present

## 2017-04-26 ENCOUNTER — Other Ambulatory Visit: Payer: Self-pay | Admitting: *Deleted

## 2017-04-26 DIAGNOSIS — J9611 Chronic respiratory failure with hypoxia: Secondary | ICD-10-CM | POA: Diagnosis not present

## 2017-04-26 DIAGNOSIS — I2510 Atherosclerotic heart disease of native coronary artery without angina pectoris: Secondary | ICD-10-CM | POA: Diagnosis not present

## 2017-04-26 DIAGNOSIS — I10 Essential (primary) hypertension: Secondary | ICD-10-CM | POA: Diagnosis not present

## 2017-04-26 DIAGNOSIS — J9612 Chronic respiratory failure with hypercapnia: Secondary | ICD-10-CM | POA: Diagnosis not present

## 2017-04-26 DIAGNOSIS — I739 Peripheral vascular disease, unspecified: Secondary | ICD-10-CM | POA: Diagnosis not present

## 2017-04-26 DIAGNOSIS — J441 Chronic obstructive pulmonary disease with (acute) exacerbation: Secondary | ICD-10-CM | POA: Diagnosis not present

## 2017-04-26 NOTE — Patient Outreach (Addendum)
[name] Care Management  04/26/2017  [name] 1929-08-29 [id number]    Transition of care successful  RN spoke with pt today and received an update that pt is making slight improvement with PT([facility] PO/RN remains involved) however remains with his SOB and limited mobility. Pt continue to have his bilateral swelling but states it has improved. Pt condition is making slow progress and currently with seasonal allergies but pt attempts to avoid such weather and stays inside if possible. Pt taking all his prescribed medications and attends all scheduled appointments with no delays if possible. Plan of care discussed and RN offered to further discussion the [facility] program and [facility] due to his current condition and limitation RN has with transition of care calls only telephonic assessment that pt has agreed upon at this time. Not the next telephone assessment scheduled for late May and RN strongly encourage pt to at least speak with one of the representative. Pt agreed however requested a name and contact to call at his convenience. RN provided this information and also called the individuals to make aware this pt maybe calling to obtain additional information about the program and services. Care plan updated based upon today's assessment and will be review later next month on the next follow up call. Pt is aware to contact this RN case manager if additional information is needed. RN continue to encouraged adherence to the interventions generated today. Will re-evaluate later next month with a telephone assessment.   Travis Mina, RN Care Management Coordinator [place] Office [phone]

## 2017-04-30 DIAGNOSIS — J9612 Chronic respiratory failure with hypercapnia: Secondary | ICD-10-CM | POA: Diagnosis not present

## 2017-04-30 DIAGNOSIS — I2510 Atherosclerotic heart disease of native coronary artery without angina pectoris: Secondary | ICD-10-CM | POA: Diagnosis not present

## 2017-04-30 DIAGNOSIS — I739 Peripheral vascular disease, unspecified: Secondary | ICD-10-CM | POA: Diagnosis not present

## 2017-04-30 DIAGNOSIS — J441 Chronic obstructive pulmonary disease with (acute) exacerbation: Secondary | ICD-10-CM | POA: Diagnosis not present

## 2017-04-30 DIAGNOSIS — I10 Essential (primary) hypertension: Secondary | ICD-10-CM | POA: Diagnosis not present

## 2017-04-30 DIAGNOSIS — J9611 Chronic respiratory failure with hypoxia: Secondary | ICD-10-CM | POA: Diagnosis not present

## 2017-04-30 NOTE — Addendum Note (Signed)
Addended by: Della Goo C on: 04/30/2017 02:25 PM   Modules accepted: Orders

## 2017-05-01 DIAGNOSIS — I739 Peripheral vascular disease, unspecified: Secondary | ICD-10-CM | POA: Diagnosis not present

## 2017-05-01 DIAGNOSIS — J9612 Chronic respiratory failure with hypercapnia: Secondary | ICD-10-CM | POA: Diagnosis not present

## 2017-05-01 DIAGNOSIS — I10 Essential (primary) hypertension: Secondary | ICD-10-CM | POA: Diagnosis not present

## 2017-05-01 DIAGNOSIS — J9611 Chronic respiratory failure with hypoxia: Secondary | ICD-10-CM | POA: Diagnosis not present

## 2017-05-01 DIAGNOSIS — J441 Chronic obstructive pulmonary disease with (acute) exacerbation: Secondary | ICD-10-CM | POA: Diagnosis not present

## 2017-05-01 DIAGNOSIS — I2510 Atherosclerotic heart disease of native coronary artery without angina pectoris: Secondary | ICD-10-CM | POA: Diagnosis not present

## 2017-05-02 ENCOUNTER — Ambulatory Visit: Payer: Medicare Other | Admitting: Nurse Practitioner

## 2017-05-03 DIAGNOSIS — I2510 Atherosclerotic heart disease of native coronary artery without angina pectoris: Secondary | ICD-10-CM | POA: Diagnosis not present

## 2017-05-03 DIAGNOSIS — J9611 Chronic respiratory failure with hypoxia: Secondary | ICD-10-CM | POA: Diagnosis not present

## 2017-05-03 DIAGNOSIS — J441 Chronic obstructive pulmonary disease with (acute) exacerbation: Secondary | ICD-10-CM | POA: Diagnosis not present

## 2017-05-03 DIAGNOSIS — I10 Essential (primary) hypertension: Secondary | ICD-10-CM | POA: Diagnosis not present

## 2017-05-03 DIAGNOSIS — J9612 Chronic respiratory failure with hypercapnia: Secondary | ICD-10-CM | POA: Diagnosis not present

## 2017-05-03 DIAGNOSIS — I739 Peripheral vascular disease, unspecified: Secondary | ICD-10-CM | POA: Diagnosis not present

## 2017-05-04 ENCOUNTER — Telehealth: Payer: Self-pay

## 2017-05-04 DIAGNOSIS — I739 Peripheral vascular disease, unspecified: Secondary | ICD-10-CM | POA: Diagnosis not present

## 2017-05-04 DIAGNOSIS — J441 Chronic obstructive pulmonary disease with (acute) exacerbation: Secondary | ICD-10-CM | POA: Diagnosis not present

## 2017-05-04 DIAGNOSIS — I2510 Atherosclerotic heart disease of native coronary artery without angina pectoris: Secondary | ICD-10-CM | POA: Diagnosis not present

## 2017-05-04 DIAGNOSIS — J9612 Chronic respiratory failure with hypercapnia: Secondary | ICD-10-CM | POA: Diagnosis not present

## 2017-05-04 DIAGNOSIS — J9611 Chronic respiratory failure with hypoxia: Secondary | ICD-10-CM | POA: Diagnosis not present

## 2017-05-04 DIAGNOSIS — I10 Essential (primary) hypertension: Secondary | ICD-10-CM | POA: Diagnosis not present

## 2017-05-04 NOTE — Telephone Encounter (Signed)
Phone call placed to patient's wife to introduce palliative care and to offer to schedule a visit with NP. Wife unsure if she wants to sign her husband up for palliative care. Wife made aware that we can schedule initial visit and if she requests palliative care to not continue, palliative care can sign off. Wife receptive to scheduling visit. Scheduled for Monday 05/07/17 @ 1:00pm

## 2017-05-07 ENCOUNTER — Other Ambulatory Visit: Payer: Medicare HMO | Admitting: Hospice and Palliative Medicine

## 2017-05-08 DIAGNOSIS — J441 Chronic obstructive pulmonary disease with (acute) exacerbation: Secondary | ICD-10-CM | POA: Diagnosis not present

## 2017-05-08 DIAGNOSIS — I2510 Atherosclerotic heart disease of native coronary artery without angina pectoris: Secondary | ICD-10-CM | POA: Diagnosis not present

## 2017-05-08 DIAGNOSIS — J9612 Chronic respiratory failure with hypercapnia: Secondary | ICD-10-CM | POA: Diagnosis not present

## 2017-05-08 DIAGNOSIS — I739 Peripheral vascular disease, unspecified: Secondary | ICD-10-CM | POA: Diagnosis not present

## 2017-05-08 DIAGNOSIS — J9611 Chronic respiratory failure with hypoxia: Secondary | ICD-10-CM | POA: Diagnosis not present

## 2017-05-08 DIAGNOSIS — I10 Essential (primary) hypertension: Secondary | ICD-10-CM | POA: Diagnosis not present

## 2017-05-09 ENCOUNTER — Other Ambulatory Visit: Payer: Medicare HMO | Admitting: Hospice and Palliative Medicine

## 2017-05-09 DIAGNOSIS — Z515 Encounter for palliative care: Secondary | ICD-10-CM

## 2017-05-10 ENCOUNTER — Encounter: Payer: Self-pay | Admitting: Hospice and Palliative Medicine

## 2017-05-10 DIAGNOSIS — I959 Hypotension, unspecified: Secondary | ICD-10-CM | POA: Diagnosis not present

## 2017-05-10 DIAGNOSIS — M791 Myalgia, unspecified site: Secondary | ICD-10-CM | POA: Diagnosis not present

## 2017-05-10 DIAGNOSIS — R531 Weakness: Secondary | ICD-10-CM | POA: Diagnosis not present

## 2017-05-10 DIAGNOSIS — Z515 Encounter for palliative care: Secondary | ICD-10-CM | POA: Insufficient documentation

## 2017-05-10 DIAGNOSIS — Z7189 Other specified counseling: Secondary | ICD-10-CM | POA: Insufficient documentation

## 2017-05-10 DIAGNOSIS — E782 Mixed hyperlipidemia: Secondary | ICD-10-CM | POA: Diagnosis not present

## 2017-05-10 DIAGNOSIS — Z01810 Encounter for preprocedural cardiovascular examination: Secondary | ICD-10-CM | POA: Insufficient documentation

## 2017-05-10 DIAGNOSIS — Z79899 Other long term (current) drug therapy: Secondary | ICD-10-CM | POA: Diagnosis not present

## 2017-05-10 NOTE — Progress Notes (Signed)
PALLIATIVE CARE CONSULT VISIT   PATIENT NAME: [name] DOB: Jun 27, 1929 MRN: [id number]  PRIMARY CARE PROVIDER: [name], MD  REFERRING PROVIDER: [name], MD [place], [place] [address]  RESPONSIBLE PARTY:   self   RECOMMENDATIONS and PLAN:  1. Shortness of breath: recommend using nebulizer (xopenex) vs inhaler. Continue Z pack. Continue prednisone. Possible endocrinologist visit for thyroid/dizziness problems? Immunologist to discover possible allergens. Hospital bed or similar mattress to sleep with head elevated. 2. Nasal congestion: allergist as above. Zyrtec at HS vs the am. Continue Mucinex. Flonase BID vs q day 3. Irregular heartbeat: notify Dr. Tamala Julian, as well as Dr. Harrington Challenger. Call 911 with any chest pain. Continue Metoprolol as ordered. Anticoagulation? Draw labs to check electrolytes.  4. Leg weakness: discuss need for statin with PCP/cardiologist. Perhaps HHC PT? 5. ACP: Introduced the idea of DNR/MOST form. Wants to continue this discussion next visit.   I spent 60 minutes providing this consultation,  from 1pm to 2:00 pm. More than 50% of the time in this consultation was spent coordinating communication.   HISTORY OF PRESENT ILLNESS:  [name] is a 84 y.o.  male with multiple medical problems including 4 hospitalizations since January for influenza A, acute CHF exacerbation, acute leg weakness and angina and ongoing respiratory failure. Palliative Care was asked to help address symptom management and  goals of care.   CODE STATUS: DNR  PPS: 50% HOSPICE ELIGIBILITY/DIAGNOSIS: TBD  PAST MEDICAL HISTORY:  Past Medical History:  Diagnosis Date  . Arthritis    "generalized" (04/04/2017)  . Asthma   . Basal cell carcinoma    "left ear"  . BPH (benign prostatic hyperplasia)    severe; s/p multiple biopsies  . CAD (coronary artery disease)   . Carotid artery occlusion   . Chronic respiratory failure ([place])   . Chronic rhinitis     . COPD (chronic obstructive pulmonary disease) (HCC)    2L [place] O2  . Diastolic heart failure ([place]) 2019  . Elevated troponin 03/09/2017  . GERD (gastroesophageal reflux disease)   . History of blood transfusion    "w/his CABG" (04/04/2017)  . History of kidney stones   . Hyperlipidemia   . Hypertension   . On home oxygen therapy    "~ 24/7" (04/04/2017)  . Peripheral vascular disease ([place])   . Pneumonia 2019  . Syncope and collapse   . Ureteral tumor 08/2015   had endoscopic procedure for evaluation, unable to reach for biopsy    SOCIAL HX:  Social History   Tobacco Use  . Smoking status: Former Smoker    Packs/day: 2.00    Years: 33.00    Pack years: 66.00    Types: Cigarettes    Last attempt to quit: 03/25/1982    Years since quitting: 35.1  . Smokeless tobacco: Never Used  Substance Use Topics  . Alcohol use: Yes    Alcohol/week: 0.0 oz    Comment: 1.5-3 oz daily in the past, minimal use in the last few months    ALLERGIES:  Allergies  Allergen Reactions  . Cefdinir Other (See Comments)    Severe Diarrhea  . Nitrofurantoin Swelling    Hand Swelling  . Sulfa Antibiotics Anaphylaxis and Swelling  . Sulfonamide Derivatives Swelling    REACTION: facial/tongue swelling  . Cefdinir Diarrhea    REACTION: diarrhea  . Ciprofloxacin Itching and Rash    Red itchy hands  . Nitrofurantoin Swelling  Swollen hands  . Ciprofloxacin Itching and Rash  . Levaquin [Levofloxacin] Itching and Rash  . Levaquin [Levofloxacin] Itching and Rash     PERTINENT MEDICATIONS:  Outpatient Encounter Medications as of 05/09/2017  Medication Sig  . ALPRAZolam (XANAX) 0.25 MG tablet Take half (1/2) tablet by mouth three (3) times daily.  Marland Kitchen aspirin 81 MG tablet Take 81 mg by mouth daily.  Marland Kitchen atorvastatin (LIPITOR) 20 MG tablet TAKE 1 TABLET BY MOUTH EVERY DAY  . cetirizine (ZYRTEC) 10 MG tablet Take 10 mg by mouth daily.  . Cholecalciferol (VITAMIN D3) 2000 units TABS Take 2,000 Units by  mouth daily.  . Cyanocobalamin (VITAMIN B 12 PO) Take 1 tablet daily by mouth.   . finasteride (PROSCAR) 5 MG tablet Take 5 mg by mouth every morning.  . furosemide (LASIX) 20 MG tablet Take 1 tablet (20 mg total) by mouth daily. Can take one additional pill if needed for swelling.  Marland Kitchen guaiFENesin (MUCINEX) 600 MG 12 hr tablet Take 600 mg by mouth 2 (two) times daily as needed for cough.   . hydrALAZINE (APRESOLINE) 25 MG tablet Take 25 mg by mouth every 6 (six) hours as needed.  . levalbuterol (XOPENEX) 0.63 MG/3ML nebulizer solution USE ONE VIAL VIA NEBULIZER EVERY 4 HOURS AS NEEDED FOR WHEEZING OR SHORTNESS OF BREATH  . metoprolol succinate (TOPROL-XL) 25 MG 24 hr tablet Take 0.5 tablets (12.5 mg total) by mouth daily.  . mometasone (NASONEX) 50 MCG/ACT nasal spray SHAKE LIQUID AND USE 2 SPRAYS IN EACH NOSTRIL DAILY  . Multiple Vitamins-Minerals (PRESERVISION AREDS 2) CAPS Take 1 capsule by mouth daily.  . OXYGEN Inhale 2 L continuous into the lungs.   . pantoprazole (PROTONIX) 40 MG tablet Take 40 mg by mouth daily.  . predniSONE (DELTASONE) 10 MG tablet Take 1-2 tablets (10-20 mg total) by mouth daily with breakfast.  . ranitidine (ZANTAC) 150 MG tablet Take 150 mg by mouth 2 (two) times daily.  Marland Kitchen SPIRIVA RESPIMAT 2.5 MCG/ACT AERS Inhale 2 puffs into the lungs daily.  . SYMBICORT 160-4.5 MCG/ACT inhaler INHALE 2 PUFFS BY MOUTH EVERY MORNING AND THEN ANOTHER 2 PUFFS ABOUT 12 HOURS LATER  . XOPENEX HFA 45 MCG/ACT inhaler INHALE 2 PUFFS BY MOUTH EVERY 4 HOURS AS NEEDED FOR WHEEZING OR SHORTNESS OF BREATH   No facility-administered encounter medications on file as of 05/09/2017.     PHYSICAL EXAM:   General: Chronically ill...difficult time catching his breath.  Cardiovascular: very irregular but rate controlled Pulmonary: diminished throughout.  Abdomen: soft, active BS Extremities: uses all 4 but is weak and unsteady Skin: thin, easily bruised Neurological: alert and oriented x 3.  +generalized weakness; [place], NP

## 2017-05-11 ENCOUNTER — Telehealth: Payer: Self-pay | Admitting: Physician Assistant

## 2017-05-11 DIAGNOSIS — I2510 Atherosclerotic heart disease of native coronary artery without angina pectoris: Secondary | ICD-10-CM | POA: Diagnosis not present

## 2017-05-11 DIAGNOSIS — J9612 Chronic respiratory failure with hypercapnia: Secondary | ICD-10-CM | POA: Diagnosis not present

## 2017-05-11 DIAGNOSIS — I739 Peripheral vascular disease, unspecified: Secondary | ICD-10-CM | POA: Diagnosis not present

## 2017-05-11 DIAGNOSIS — I10 Essential (primary) hypertension: Secondary | ICD-10-CM | POA: Diagnosis not present

## 2017-05-11 DIAGNOSIS — J441 Chronic obstructive pulmonary disease with (acute) exacerbation: Secondary | ICD-10-CM | POA: Diagnosis not present

## 2017-05-11 DIAGNOSIS — J9611 Chronic respiratory failure with hypoxia: Secondary | ICD-10-CM | POA: Diagnosis not present

## 2017-05-11 NOTE — Telephone Encounter (Signed)
Spoke with patient concerned about hypotension.  His blood pressures have been 84/52 and 93/60 most recently.  His history shows Bps 106/60, 118/68.  I told him to hydrate over the weekend, take his Bps bid and follow up on Monday.  He verbalized understanding.

## 2017-05-11 NOTE — Telephone Encounter (Signed)
New Message  Pt c/o BP issue: STAT if pt c/o blurred vision, one-sided weakness or slurred speech  1. What are your last 5 BP readings? 82/50  2. Are you having any other symptoms (ex. Dizziness, headache, blurred vision, passed out)? Woozy and tired  3. What is your BP issue? Pt states that his blood pressure has been running low. Please call

## 2017-05-11 NOTE — Telephone Encounter (Signed)
Agree. Thanks

## 2017-05-11 NOTE — Telephone Encounter (Signed)
THIS CALL SHOULD BE ROUTED TO TRIAGE FOR FURTHER EVALUATION.

## 2017-05-15 DIAGNOSIS — R300 Dysuria: Secondary | ICD-10-CM | POA: Diagnosis not present

## 2017-05-15 DIAGNOSIS — N401 Enlarged prostate with lower urinary tract symptoms: Secondary | ICD-10-CM | POA: Diagnosis not present

## 2017-05-15 DIAGNOSIS — C669 Malignant neoplasm of unspecified ureter: Secondary | ICD-10-CM | POA: Diagnosis not present

## 2017-05-16 DIAGNOSIS — I2510 Atherosclerotic heart disease of native coronary artery without angina pectoris: Secondary | ICD-10-CM | POA: Diagnosis not present

## 2017-05-16 DIAGNOSIS — J9611 Chronic respiratory failure with hypoxia: Secondary | ICD-10-CM | POA: Diagnosis not present

## 2017-05-16 DIAGNOSIS — I10 Essential (primary) hypertension: Secondary | ICD-10-CM | POA: Diagnosis not present

## 2017-05-16 DIAGNOSIS — I739 Peripheral vascular disease, unspecified: Secondary | ICD-10-CM | POA: Diagnosis not present

## 2017-05-16 DIAGNOSIS — J9612 Chronic respiratory failure with hypercapnia: Secondary | ICD-10-CM | POA: Diagnosis not present

## 2017-05-16 DIAGNOSIS — J441 Chronic obstructive pulmonary disease with (acute) exacerbation: Secondary | ICD-10-CM | POA: Diagnosis not present

## 2017-05-17 ENCOUNTER — Ambulatory Visit: Payer: Medicare Other | Admitting: Interventional Cardiology

## 2017-05-17 ENCOUNTER — Encounter

## 2017-05-17 DIAGNOSIS — D0422 Carcinoma in situ of skin of left ear and external auricular canal: Secondary | ICD-10-CM | POA: Diagnosis not present

## 2017-05-18 ENCOUNTER — Telehealth: Payer: Self-pay

## 2017-05-18 NOTE — Telephone Encounter (Signed)
Phone call placed to patient's wife to offer to schedule a follow up visit with Palliative Care. Scheduled for Wednesday 05/23/17 @ 1pm

## 2017-05-22 NOTE — Telephone Encounter (Signed)
error 

## 2017-05-23 ENCOUNTER — Other Ambulatory Visit: Payer: Medicare HMO | Admitting: Hospice and Palliative Medicine

## 2017-05-24 ENCOUNTER — Other Ambulatory Visit: Payer: Self-pay | Admitting: *Deleted

## 2017-05-24 ENCOUNTER — Encounter: Payer: Self-pay | Admitting: *Deleted

## 2017-05-24 ENCOUNTER — Telehealth: Payer: Self-pay | Admitting: Licensed Clinical Social Worker

## 2017-05-24 DIAGNOSIS — I10 Essential (primary) hypertension: Secondary | ICD-10-CM | POA: Diagnosis not present

## 2017-05-24 DIAGNOSIS — I2510 Atherosclerotic heart disease of native coronary artery without angina pectoris: Secondary | ICD-10-CM | POA: Diagnosis not present

## 2017-05-24 DIAGNOSIS — I739 Peripheral vascular disease, unspecified: Secondary | ICD-10-CM | POA: Diagnosis not present

## 2017-05-24 DIAGNOSIS — J441 Chronic obstructive pulmonary disease with (acute) exacerbation: Secondary | ICD-10-CM | POA: Diagnosis not present

## 2017-05-24 DIAGNOSIS — J9611 Chronic respiratory failure with hypoxia: Secondary | ICD-10-CM | POA: Diagnosis not present

## 2017-05-24 DIAGNOSIS — J9612 Chronic respiratory failure with hypercapnia: Secondary | ICD-10-CM | POA: Diagnosis not present

## 2017-05-24 NOTE — Telephone Encounter (Signed)
Palliative Care SW spoke with patient to set up a home visit.  He stated he will call back with dates he is available.

## 2017-05-24 NOTE — Progress Notes (Signed)
PALLIATIVE CARE CONSULT VISIT   PATIENT NAME: [name] DOB: October 02, 1929 MRN: [id number]  PRIMARY CARE PROVIDER: [name], MD  REFERRING PROVIDER: [name], MD [place], [place] [address]  RESPONSIBLE PARTY:   self   RECOMMENDATIONS and PLAN:  1. Shortness of breath: seems somewhat improved today. He continues with oxygen, prednisone, using nebulizer and inhaler. He completed the Z pack. Continues on the Trilogy at night but not certain that it is helping. Oxygen sats are in low 90's on oxygen today. It improves with deep breaths. Incentive spirometry would help.  2. Nasal congestion:This affects his quality of life more than any other symptom. He thinks increasing his Flonase to BID has helped some. He is going to follow up with an allergist. Will try Xyzal as an antihistamine. Continues on Mucinex...suggested either mucinex D at HS or perhaps a decongestant at night.  Going to try acupuncture.  3. Irregular heartbeat: Has now seen his PCP. Recommend following up with his cardiologist to see if medication could assist in his tachycardia, irregular rhythm. Feel that this arhythmia is greatly affecting his shortness of breath. Doubt he is candidate for anything invasive? Perhaps medication for rate controller?  4. Leg weakness: he has been taken off his statin x 2 weeks. He does not see any improvement. I think he is walking at a much faster pace than I previously saw him. He is thinking that he wants to restart the statin. He is currently getting HHC.  5. ACP: discussion held about CPR. He wants CPR initiated. He does not want a feeding tube. He does not want mechanical ventilation but does want positive pressure. Desires hospitalization if needed for IV therapy.   I spent 60 minutes providing this consultation,  from 1pm to 2:00 pm. More than 50% of the time in this consultation was spent interviewing patient and wife, assessing patient and  coordinating communication. Discussed goals of care with MOST form for approximately 20 minutes.   HISTORY OF PRESENT ILLNESS:  [name] is an 84 y.o.  male with multiple medical problems with 4 hospitalizations since January for influenza A, acute CHF exacerbation, acute leg weakness and angina and ongoing respiratory failure. Palliative Care was asked to help address symptom management and  goals of care. He has now been moved into the [facility] group and has accepted that an RN/SW will be visiting him perhaps weekly with goal of keeping him out of the hospital.   CODE STATUS: full code  PPS: 50% HOSPICE ELIGIBILITY/DIAGNOSIS: TBD.Marland Kitchennot currently in alignment with hospice philosophy    Last Vitals:  05/23/17 130/70, 100 irreg, 92% on oxygen, 24    PAST MEDICAL HISTORY:      Past Medical History:  Diagnosis Date  . Arthritis    "generalized" (04/04/2017)  . Asthma   . Basal cell carcinoma    "left ear"  . BPH (benign prostatic hyperplasia)    severe; s/p multiple biopsies  . CAD (coronary artery disease)   . Carotid artery occlusion   . Chronic respiratory failure ([place])   . Chronic rhinitis   . COPD (chronic obstructive pulmonary disease) ([facility])    2L [place] O2  . Diastolic heart failure ([place]) 2019  . Elevated troponin 03/09/2017  . GERD (gastroesophageal reflux disease)   . History of blood transfusion    "w/his CABG" (04/04/2017)  . History of kidney stones   . Hyperlipidemia   . Hypertension   .  On home oxygen therapy    "~ 24/7" (04/04/2017)  . Peripheral vascular disease ([place])   . Pneumonia 2019  . Syncope and collapse   . Ureteral tumor 08/2015   had endoscopic procedure for evaluation, unable to reach for biopsy    SOCIAL HX:  Social History        Tobacco Use  . Smoking status: Former Smoker    Packs/day: 2.00    Years: 33.00    Pack years: 66.00    Types: Cigarettes    Last attempt to quit: 03/25/1982    Years since  quitting: 35.1  . Smokeless tobacco: Never Used  Substance Use Topics  . Alcohol use: Yes    Alcohol/week: 0.0 oz    Comment: 1.5-3 oz daily in the past, minimal use in the last few months    ALLERGIES:       Allergies  Allergen Reactions  . Cefdinir Other (See Comments)    Severe Diarrhea  . Nitrofurantoin Swelling    Hand Swelling  . Sulfa Antibiotics Anaphylaxis and Swelling  . Sulfonamide Derivatives Swelling    REACTION: facial/tongue swelling  . Cefdinir Diarrhea    REACTION: diarrhea  . Ciprofloxacin Itching and Rash    Red itchy hands  . Nitrofurantoin Swelling    Swollen hands  . Ciprofloxacin Itching and Rash  . Levaquin [Levofloxacin] Itching and Rash  . Levaquin [Levofloxacin] Itching and Rash     PERTINENT MEDICATIONS:      Outpatient Encounter Medications as of 05/09/2017  Medication Sig  . ALPRAZolam (XANAX) 0.25 MG tablet Take half (1/2) tablet by mouth three (3) times daily.  Marland Kitchen aspirin 81 MG tablet Take 81 mg by mouth daily.  Marland Kitchen atorvastatin (LIPITOR) 20 MG tablet TAKE 1 TABLET BY MOUTH EVERY DAY  . cetirizine (ZYRTEC) 10 MG tablet Take 10 mg by mouth daily.  . Cholecalciferol (VITAMIN D3) 2000 units TABS Take 2,000 Units by mouth daily.  . Cyanocobalamin (VITAMIN B 12 PO) Take 1 tablet daily by mouth.   . finasteride (PROSCAR) 5 MG tablet Take 5 mg by mouth every morning.  . furosemide (LASIX) 20 MG tablet Take 1 tablet (20 mg total) by mouth daily. Can take one additional pill if needed for swelling.  Marland Kitchen guaiFENesin (MUCINEX) 600 MG 12 hr tablet Take 600 mg by mouth 2 (two) times daily as needed for cough.   . hydrALAZINE (APRESOLINE) 25 MG tablet Take 25 mg by mouth every 6 (six) hours as needed.  . levalbuterol (XOPENEX) 0.63 MG/3ML nebulizer solution USE ONE VIAL VIA NEBULIZER EVERY 4 HOURS AS NEEDED FOR WHEEZING OR SHORTNESS OF BREATH  . metoprolol succinate (TOPROL-XL) 25 MG 24 hr tablet Take 0.5 tablets (12.5 mg total) by mouth daily.   . mometasone (NASONEX) 50 MCG/ACT nasal spray SHAKE LIQUID AND USE 2 SPRAYS IN EACH NOSTRIL DAILY  . Multiple Vitamins-Minerals (PRESERVISION AREDS 2) CAPS Take 1 capsule by mouth daily.  . OXYGEN Inhale 2 L continuous into the lungs.   . pantoprazole (PROTONIX) 40 MG tablet Take 40 mg by mouth daily.  . predniSONE (DELTASONE) 10 MG tablet Take 1-2 tablets (10-20 mg total) by mouth daily with breakfast.  . ranitidine (ZANTAC) 150 MG tablet Take 150 mg by mouth 2 (two) times daily.  Marland Kitchen SPIRIVA RESPIMAT 2.5 MCG/ACT AERS Inhale 2 puffs into the lungs daily.  . SYMBICORT 160-4.5 MCG/ACT inhaler INHALE 2 PUFFS BY MOUTH EVERY MORNING AND THEN ANOTHER 2 PUFFS ABOUT 12 HOURS LATER  .  XOPENEX HFA 45 MCG/ACT inhaler INHALE 2 PUFFS BY MOUTH EVERY 4 HOURS AS NEEDED FOR WHEEZING OR SHORTNESS OF BREATH       PHYSICAL EXAM:   General: Chronically ill...thin; short of breath Cardiovascular: very irregular; at 100 bpm Pulmonary: diminished throughout.  Abdomen: soft, active BS Extremities: uses all 4 but is weak and unsteady Skin: thin, easily bruised Neurological: alert and oriented x 3. +generalized weakness; [place], NP

## 2017-05-24 NOTE — Patient Outreach (Signed)
[facility]) Care Management  05/24/2017  [name] [birth year] [id number]    Case Closure  RN spoke with pt and spouse today concerning last discussion on possible services with Palliative care. Information was provided if pt was seeking more information on these services. Pt indicated he decided to go with the Palliative care services and verified they have made the initial home visit already. RN discussed the services and plan of care over the last several months and informed pt that the current services he has with [facility] would be able to provide more services directly toward his current medical condition. Therefore due to this availability of services for case management needs he would no longer need [facility] services for ongoing telephone assessments.   RN contacted [facility] and verified pt is active with this services.   Pt discharged from [facility] but aware if services are needed in the further please contact the [facility] office directly to inquire further. No additional needs at this time.  [name], RN Care Management Coordinator [place] Office [phone]

## 2017-05-25 ENCOUNTER — Encounter: Payer: Self-pay | Admitting: Internal Medicine

## 2017-05-25 ENCOUNTER — Ambulatory Visit (INDEPENDENT_AMBULATORY_CARE_PROVIDER_SITE_OTHER): Payer: Medicare Other | Admitting: Internal Medicine

## 2017-05-25 ENCOUNTER — Ambulatory Visit (INDEPENDENT_AMBULATORY_CARE_PROVIDER_SITE_OTHER)
Admission: RE | Admit: 2017-05-25 | Discharge: 2017-05-25 | Disposition: A | Discharge status: Home / Self Care | Payer: Medicare Other | Source: Ambulatory Visit | Attending: Internal Medicine | Admitting: Internal Medicine

## 2017-05-25 ENCOUNTER — Other Ambulatory Visit: Payer: Self-pay | Admitting: Interventional Cardiology

## 2017-05-25 VITALS — BP 124/60 | HR 110 | Ht 69.0 in | Wt 134.0 lb

## 2017-05-25 DIAGNOSIS — J9611 Chronic respiratory failure with hypoxia: Secondary | ICD-10-CM

## 2017-05-25 DIAGNOSIS — I2781 Cor pulmonale (chronic): Secondary | ICD-10-CM

## 2017-05-25 DIAGNOSIS — J449 Chronic obstructive pulmonary disease, unspecified: Secondary | ICD-10-CM

## 2017-05-25 DIAGNOSIS — I200 Unstable angina: Secondary | ICD-10-CM | POA: Diagnosis not present

## 2017-05-25 DIAGNOSIS — J9612 Chronic respiratory failure with hypercapnia: Secondary | ICD-10-CM

## 2017-05-25 DIAGNOSIS — R0602 Shortness of breath: Secondary | ICD-10-CM | POA: Diagnosis not present

## 2017-05-25 MED ORDER — SPIRONOLACTONE 50 MG PO TABS: 50.0000 mg | ORAL_TABLET | Freq: Every day | ORAL | 11 refills | Status: DC

## 2017-05-25 NOTE — Progress Notes (Signed)
Subjective:    Patient ID: [name], male    DOB: [birth year]   MRN: [id number]   87 yowm quit smoking in 1984 with GOLD IV/ 02 dep and steroid dep copd place on NIV by Dr Larkin Ina with profound chronic FTT not improved on NIV    05/25/2017  f/u ov/[name] re:  Chief Complaint  Patient presents with  . Follow-up    Increased SOB and  fatigue x last week, 2.5L pulsed, compliant with spiriva, symbicort, and xopenex.    Dyspnea:  At rest/ worse walking 10 ft  Cough: none Sleep: on trilogy ok SABA use:  Confused with saba hfa vs neb   No obvious day to day or daytime variability or assoc excess/ purulent sputum or mucus plugs or hemoptysis or cp or chest tightness, subjective wheeze or overt   hb symptoms. No unusual exposure hx or h/o childhood pna/ asthma or knowledge of premature birth.  Sleeping on trilogy ok   without nocturnal  or early am exacerbation  of respiratory  c/o's or need for noct saba. Also denies any obvious fluctuation of symptoms with weather or environmental changes or other aggravating or alleviating factors except as outlined above   Current Allergies, Complete Past Medical History, Past Surgical History, Family History, and Social History were reviewed in [employer] record.  ROS  The following are not active complaints unless bolded Hoarseness, sore throat, dysphagia, dental problems, itching, sneezing,  nasal congestion or discharge of excess mucus or purulent secretions, ear ache,   fever, chills, sweats, unintended wt loss or wt gain, classically pleuritic or exertional cp,  orthopnea pnd or arm/hand swelling  or leg swelling, presyncope, palpitations, abdominal pain, anorexia, nausea, vomiting, diarrhea  or change in bowel habits or change in bladder habits, change in stools or change in urine, dysuria, hematuria,  rash, arthralgias, visual complaints, headache, numbness, weakness or ataxia or problems with walking or coordination,  change in  mood or  Memory " head feels fuzzy"        Current Meds  Medication Sig  . ALPRAZolam (XANAX) 0.25 MG tablet Take half (1/2) tablet by mouth three (3) times daily.  Marland Kitchen aspirin 81 MG tablet Take 81 mg by mouth daily.  . Cholecalciferol (VITAMIN D3) 2000 units TABS Take 2,000 Units by mouth daily.  . Cyanocobalamin (VITAMIN B 12 PO) Take 1 tablet daily by mouth.   . finasteride (PROSCAR) 5 MG tablet Take 5 mg by mouth every morning.  . furosemide (LASIX) 20 MG tablet Take 1 tablet (20 mg total) by mouth daily. Can take one additional pill if needed for swelling.  Marland Kitchen guaiFENesin (MUCINEX) 600 MG 12 hr tablet Take 600 mg by mouth 2 (two) times daily as needed for cough.   . hydrALAZINE (APRESOLINE) 25 MG tablet Take 25 mg by mouth every 6 (six) hours as needed.  . levalbuterol (XOPENEX) 0.63 MG/3ML nebulizer solution USE ONE VIAL VIA NEBULIZER EVERY 4 HOURS AS NEEDED FOR WHEEZING OR SHORTNESS OF BREATH  . metoprolol succinate (TOPROL-XL) 25 MG 24 hr tablet Take 0.5 tablets (12.5 mg total) by mouth daily.  . mometasone (NASONEX) 50 MCG/ACT nasal spray SHAKE LIQUID AND USE 2 SPRAYS IN EACH NOSTRIL DAILY  . Multiple Vitamins-Minerals (PRESERVISION AREDS 2) CAPS Take 1 capsule by mouth daily.  . OXYGEN Inhale 2 L continuous into the lungs.   . pantoprazole (PROTONIX) 40 MG tablet Take 40 mg by mouth daily.  . predniSONE (DELTASONE) 10 MG tablet  Take 1-2 tablets (10-20 mg total) by mouth daily with breakfast.  . ranitidine (ZANTAC) 150 MG tablet Take 150 mg by mouth 2 (two) times daily.  Marland Kitchen SPIRIVA RESPIMAT 2.5 MCG/ACT AERS Inhale 2 puffs into the lungs daily.  . SYMBICORT 160-4.5 MCG/ACT inhaler INHALE 2 PUFFS BY MOUTH EVERY MORNING AND THEN ANOTHER 2 PUFFS ABOUT 12 HOURS LATER  . XOPENEX HFA 45 MCG/ACT inhaler INHALE 2 PUFFS BY MOUTH EVERY 4 HOURS AS NEEDED FOR WHEEZING OR SHORTNESS OF BREATH            Objective:   Physical Exam    wt 159 04/28/2010  > 10/14/2010 154  > 10/04/2011 150> 12/21/2011   150 > 149 01/29/2012 >154 04/05/2012 > 05/22/2012 148 > 09/30/2012  146 >>144> 10/15/2012 > 12/18/2012  145 > 148 02/14/2013 >148 03/14/2013 > 04/25/2013 148 > 06/19/2013 150 >>07/17/2013 > 08/27/2013 148 > 10/23/2013  148 >153 12/09/2013 > 02/26/2014  153 > 06/01/2014  152 > 157 06/15/2014 > 09/21/2014 146 > 10/19/2014 151 >>148 11/19/14 > 02/24/2015 148 > 06/30/2015    140 >  10/04/2015  136 > 12/20/2015   142 > 06/13/2016  132 > 09/13/2016   133 >  11/07/2016  139 > 01/11/2017   135 > 02/20/2017  135 >  03/22/2017  132 > 05/25/2017 134      amb thin anxious wm nad  Vital signs reviewed - Note on arrival 02 sats  97% on  2lpm pulsed         HEENT: nl dentition / oropharynx. Nl external ear canals without cough reflex - moderate bilateral non-specific turbinate edema     NECK :  without JVD/Nodes/TM/ nl carotid upstrokes bilaterally   LUNGS: no acc muscle use,  Mod barrel  contour chest wall with bilateral  Distant bs s audible wheeze and  without cough on insp or exp maneuver and mod   Hyperresonant  to  percussion bilaterally     CV:  RRR  no s3 or murmur or increase in P2, and 2+ sym lower ext pitting   ABD:  soft and nontender with pos mid insp Hoover's  in the supine position. No bruits or organomegaly appreciated, bowel sounds nl  MS:   Nl gait/  ext warm without deformities, calf tenderness, cyanosis or clubbing No obvious joint restrictions   SKIN: warm and dry without lesions    NEURO:  alert, approp, nl sensorium with  no motor or cerebellar deficits apparent.       CXR PA and Lateral:   05/25/2017 :    I personally reviewed images and   impression as follows:   Severe hyperinflation / copd changes only

## 2017-05-25 NOTE — Patient Instructions (Addendum)
Add aldactone 50 mg daily   Continue triegy at your volition  Ok to adjust xanax (alprazolam) from one-half to one three times a day to calm your breathing down and relieve anxiety related to breathlessness   See calendar for specific medication instructions and bring it back for each and every office visit for every healthcare provider you see.  Without it,  you may not receive the best quality medical care that we feel you deserve.  You will note that the calendar groups together  your maintenance  medications that are timed at particular times of the day.  Think of this as your checklist for what your doctor has instructed you to do until your next evaluation to see what benefit  there is  to staying on a consistent group of medications intended to keep you well.  The other group at the bottom is entirely up to you to use as you see fit  for specific symptoms that may arise between visits that require you to treat them on an as needed basis.  Think of this as your action plan or "what if" list.   Separating the top medications from the bottom group is fundamental to providing you adequate care going forward.     Keep your scheduled follow up appt

## 2017-05-26 ENCOUNTER — Encounter: Payer: Self-pay | Admitting: Internal Medicine

## 2017-05-26 NOTE — Assessment & Plan Note (Signed)
-   Start noct 02 at 2lpm 2008 and on 03/04/09 desat @ > 185 ft so rec wear with activtiy > rm to rm  - 02/15/2011   Walked RA x one lap @ 185 stopped due to  desat > corrected on 2lpm - 02/26/2014  Walked 2lpm  2 laps @ 185 ft each stopped due to  Sob/ sats 88% at nl pace  - 10/19/2014   Walked RA  2 laps @ 185 ft each stopped due to  End of study, slow pace,  sob  But no  desat   05/21/15 Eval by Dr Dedio/ rec trial of NIV  - HCO3  10/27/15 = 34  - HC03  10/06/16  = 28 ? On noct vent (non compliant)  As of admit  02/04/17 back on noct vent/ 02 2lpm 24/7  See copd comments, need to taper off noct vent and use more xanax to palliate his sob

## 2017-05-26 NOTE — Assessment & Plan Note (Addendum)
-   PFT's  06/08/2015  FEV1 0.58 (22 % ) ratio 27  p No sign % improvement from saba p symb/spiriva prior to study with DLCO  9/9 % corrects to 27 % for alv volume   - changed pred to ceiling of 20 and floor of 5 mg daily 06/13/16 > increased floor to 10 mg daily effective 02/20/2017  - referred back to rehab 09/13/2016  - try off noct vent and start titrating up xanax 01/11/2017 > no change but then admitted with flu and placed back on vent  - alpha one AT 03/01/17  MM     - 05/25/2017  After extensive coaching inhaler device  effectiveness =  75% (short Ti)       He has typical endstage dz and I don't believe the ventilator is helping here. In fact,  Though somewhat paradoxic, when the lung fails to clear C02 properly and pC02 rises the lung then becomes a more efficient scavenger of C02 allowing lower work of breathing and  better C02 clearance albeit at a higher serum pC02 level - this is why pts can look a lot better than their ABG's would suggest and why it's so difficult to prognosticate endstage dz.  It's also why I strongly rec DNI status (ventilating pts down to a nl pC02 adversely affects this compensatory mechanism) and rec pt start to cut down on vent hours and consider full hospice rx at this point and increase xanax  To 0.25 mg tid if tol or change to low dose clonazepam (defer to palliative care)

## 2017-05-26 NOTE — Assessment & Plan Note (Signed)
Added back aldactone 50 mg daily, no change lasix rx   I had an extended discussion with the patient reviewing all relevant studies completed to date and  lasting 15 to 20 minutes of a 25 minute visit    Each maintenance medication was reviewed in detail including most importantly the difference between maintenance and prns and under what circumstances the prns are to be triggered using an action plan format that is not reflected in the computer generated alphabetically organized AVS but trather by a customized med calendar that reflects the AVS meds with confirmed 100% correlation.   In addition, Please see AVS for unique instructions that I personally wrote and verbalized to the the pt in detail and then reviewed with pt  by my nurse highlighting any  changes in therapy recommended at today's visit to their plan of care.

## 2017-05-28 NOTE — Progress Notes (Signed)
Spoke with pt and notified of results per Dr. Wert. Pt verbalized understanding and denied any questions. 

## 2017-05-29 ENCOUNTER — Other Ambulatory Visit: Payer: Self-pay | Admitting: Internal Medicine

## 2017-05-30 ENCOUNTER — Telehealth: Payer: Self-pay | Admitting: Interventional Cardiology

## 2017-05-30 ENCOUNTER — Ambulatory Visit: Payer: Medicare Other | Admitting: Vascular Surgery

## 2017-05-30 ENCOUNTER — Encounter (HOSPITAL_COMMUNITY): Payer: Medicare Other

## 2017-05-30 DIAGNOSIS — J441 Chronic obstructive pulmonary disease with (acute) exacerbation: Secondary | ICD-10-CM | POA: Diagnosis not present

## 2017-05-30 DIAGNOSIS — J9612 Chronic respiratory failure with hypercapnia: Secondary | ICD-10-CM | POA: Diagnosis not present

## 2017-05-30 DIAGNOSIS — J9611 Chronic respiratory failure with hypoxia: Secondary | ICD-10-CM | POA: Diagnosis not present

## 2017-05-30 DIAGNOSIS — I10 Essential (primary) hypertension: Secondary | ICD-10-CM | POA: Diagnosis not present

## 2017-05-30 DIAGNOSIS — I2510 Atherosclerotic heart disease of native coronary artery without angina pectoris: Secondary | ICD-10-CM | POA: Diagnosis not present

## 2017-05-30 DIAGNOSIS — I739 Peripheral vascular disease, unspecified: Secondary | ICD-10-CM | POA: Diagnosis not present

## 2017-05-30 NOTE — Telephone Encounter (Signed)
Pt states he had been doing fairly well since he was last seen in our office until about 4-5 days ago.  Pt states he has been very fatigued and weak.  States SOB has worsened slightly with exertion but resting SOB is unchanged.  Also had some issues with hypotension again a few times.  BP has dropped to 80's/40's but recovers after a few hours.  Denies fever, congestion, cough or other sx to point towards respiratory issue (I.e. URI, PNA, etc).  Scheduled pt to come in first available on 5/28 at 11AM with Lyda Jester, PA-C.  Advised if any changes to let us know, contact on call or head to ER.  Pt verbalized understanding and was in agreement with this plan.

## 2017-05-30 NOTE — Telephone Encounter (Signed)
New Message:      Pt states he is needing a sooner appt than 6/17 due to him feeling really weak and fatigue over the last 4-5 days.

## 2017-05-30 NOTE — Telephone Encounter (Signed)
Baker Janus, [facility] nurse, calling to update on pt's condition as she just seen him.  States vitals upon arrival were 140/70, 92.  After standing dropped to 100/64, 104.  Pt checked BP again while I was on phone and it was 87/54.  Baker Janus states that pt is c/o of chest pain on both sides of his sternum, unsure if it is his heart or musculoskeletal as he uses accessory muscles to breath.  Pt also extremely fatigued and having pain in both arms from shoulder to wrists.  Pt did not mention CP or arm pain when we spoke the first time.  Advised I have given pt soonest appt I have in the office and if sx have been persistent and she feels pt needs to go to ER then have him proceed there for evaluation.  Baker Janus verbalized understanding and is going to advise pt to go to ER.

## 2017-06-01 ENCOUNTER — Ambulatory Visit (INDEPENDENT_AMBULATORY_CARE_PROVIDER_SITE_OTHER): Payer: Medicare Other | Admitting: Cardiology

## 2017-06-01 ENCOUNTER — Encounter: Payer: Self-pay | Admitting: Cardiology

## 2017-06-01 VITALS — BP 162/74 | HR 109 | Ht 69.0 in | Wt 128.8 lb

## 2017-06-01 DIAGNOSIS — R079 Chest pain, unspecified: Secondary | ICD-10-CM | POA: Diagnosis not present

## 2017-06-01 DIAGNOSIS — I2781 Cor pulmonale (chronic): Secondary | ICD-10-CM | POA: Diagnosis not present

## 2017-06-01 DIAGNOSIS — I951 Orthostatic hypotension: Secondary | ICD-10-CM | POA: Diagnosis not present

## 2017-06-01 DIAGNOSIS — E875 Hyperkalemia: Secondary | ICD-10-CM

## 2017-06-01 DIAGNOSIS — I10 Essential (primary) hypertension: Secondary | ICD-10-CM

## 2017-06-01 DIAGNOSIS — R002 Palpitations: Secondary | ICD-10-CM

## 2017-06-01 DIAGNOSIS — I25709 Atherosclerosis of coronary artery bypass graft(s), unspecified, with unspecified angina pectoris: Secondary | ICD-10-CM | POA: Diagnosis not present

## 2017-06-01 DIAGNOSIS — I200 Unstable angina: Secondary | ICD-10-CM

## 2017-06-01 LAB — BASIC METABOLIC PANEL
BUN/Creatinine Ratio: 19 (ref 10–24)
BUN: 19 mg/dL (ref 8–27)
CO2: 30 mmol/L — ABNORMAL HIGH (ref 20–29)
Calcium: 9.7 mg/dL (ref 8.6–10.2)
Chloride: 94 mmol/L — ABNORMAL LOW (ref 96–106)
Creatinine, Ser: 0.99 mg/dL (ref 0.76–1.27)
GFR calc Af Amer: 80 mL/min/{1.73_m2} (ref >59)
GFR calc non Af Amer: 69 mL/min/{1.73_m2} (ref >59)
Glucose: 96 mg/dL (ref 65–99)
Potassium: 3.7 mmol/L (ref 3.5–5.2)
Sodium: 139 mmol/L (ref 134–144)

## 2017-06-01 LAB — MAGNESIUM: Magnesium: 2.4 mg/dL — ABNORMAL HIGH (ref 1.6–2.3)

## 2017-06-01 NOTE — Patient Instructions (Addendum)
Medication Instructions:  Your physician recommends that you continue on your current medications as directed. Please refer to the Current Medication list given to you today.   Labwork: TODAY:  BMET & MAGNESIUM   Testing/Procedures: Your physician has recommended that you wear an event monitor FOR 2 WEEKS ONLY. Event monitors are medical devices that record the heart's electrical activity. Doctors most often [place] these monitors to diagnose arrhythmias. Arrhythmias are problems with the speed or rhythm of the heartbeat. The monitor is a small, portable device. You can wear one while you do your normal daily activities. This is usually used to diagnose what is causing palpitations/syncope (passing out).    Follow-Up: Your physician recommends that you schedule a follow-up appointment in: [place], NP   Any Other Special Instructions Will Be Listed Below (If Applicable).  Cardiac Event Monitoring A cardiac event monitor is a small recording device that is used to detect abnormal heart rhythms (arrhythmias). The monitor is used to record your heart rhythm when you have symptoms, such as:  Fast heartbeats (palpitations), such as heart racing or fluttering.  Dizziness.  Fainting or light-headedness.  Unexplained weakness.  Some monitors are wired to electrodes placed on your chest. Electrodes are flat, sticky disks that attach to your skin. Other monitors may be hand-held or worn on the wrist. The monitor can be worn for up to 30 days. If the monitor is attached to your chest, a technician will prepare your chest for the electrode placement and show you how to work the monitor. Take time to practice using the monitor before you leave the office. Make sure you understand how to send the information from the monitor to your health care provider. In some cases, you may need to use a landline telephone instead of a cell phone. What are the risks? Generally, this device is  safe to use, but it possible that the skin under the electrodes will become irritated. How to use your cardiac event monitor  Wear your monitor at all times, except when you are in water: ? Do not let the monitor get wet. ? Take the monitor off when you bathe. Do not swim or use a hot tub with it on.  Keep your skin clean. Do not put body lotion or moisturizer on your chest.  Change the electrodes as told by your health care provider or any time they stop sticking to your skin. You may need to use medical tape to keep them on.  Try to put the electrodes in slightly different places on your chest to help prevent skin irritation. They must remain in the area under your left breast and in the upper right section of your chest.  Make sure the monitor is safely clipped to your clothing or in a location close to your body that your health care provider recommends.  Press the button to record as soon as you feel heart-related symptoms, such as: ? Dizziness. ? Weakness. ? Light-headedness. ? Palpitations. ? Thumping or pounding in your chest. ? Shortness of breath. ? Unexplained weakness.  Keep a diary of your activities, such as walking, doing chores, and taking medicine. It is very important to note what you were doing when you pushed the button to record your symptoms. This will help your health care provider determine what might be contributing to your symptoms.  Send the recorded information as recommended by your health care provider. It may take some time for your health care provider  to process the results.  Change the batteries as told by your health care provider.  Keep electronic devices away from your monitor. This includes: ? Tablets. ? MP3 players. ? Cell phones.  While wearing your monitor you should avoid: ? Electric blankets. ? Armed forces operational officer. ? Electric toothbrushes. ? Microwave ovens. ? Magnets. ? Metal detectors. Get help right away if:  You have chest  pain.  You have extreme difficulty breathing or shortness of breath.  You develop a very fast heartbeat that persists.  You develop dizziness that does not go away.  You faint or constantly feel like you are about to faint. Summary  A cardiac event monitor is a small recording device that is used to help detect abnormal heart rhythms (arrhythmias).  The monitor is used to record your heart rhythm when you have heart-related symptoms.  Make sure you understand how to send the information from the monitor to your health care provider.  It is important to press the button on the monitor when you have any heart-related symptoms.  Keep a diary of your activities, such as walking, doing chores, and taking medicine. It is very important to note what you were doing when you pushed the button to record your symptoms. This will help your health care provider learn what might be causing your symptoms. This information is not intended to replace advice given to you by your health care provider. Make sure you discuss any questions you have with your health care provider. Document Released: 10/05/2007 Document Revised: 12/11/2015 Document Reviewed: 12/11/2015 Elsevier Interactive Patient Education  2017 [employer].     If you need a refill on your cardiac medications before your next appointment, please call your pharmacy.

## 2017-06-01 NOTE — Progress Notes (Signed)
Cardiology Office Note   Date:  06/01/2017   ID:  [name], DOB Oct 23, 1929, MRN [id number]  PCP:  [name], MD  Cardiologist:  Dr. Tamala Julian    Chief Complaint  Patient presents with  . Hypotension      History of Present Illness: [name] is a 84 y.o. male who presents for orthostatic hypotension.    He has a hx of CABG2003, HTN, COPD on steroids w/ chronic resp failure,cor pulmonale on 2 L oxygen and nocturnal ventilation, uses trilogy machine,hyperlipidemia, PAD, history of carotid obstructive disease and RBBB  Has had several hospitalizations most recent in March twice once with COPD flare.  BB reduced due to hypotension.  For chest pain ranexa added.  Later he had weakness and tremors and Ranexa was stopped.  Pt with chronic Lower ext edema, on last visit 04/12/17 he had chest burning.  It was unclear if this was cardiac or GI, but with discussion with Dr. Tamala Julian pt is not a candidate for any invasive eval given end stage lung disease.  On PPI.  Hx of orthostatic hypotension.  Did not tolerate spironolactone.    He is followed by palliative care at home.  He is having leg weakness, he did hold statin for 2 weeks and had no improvement so he has resumed.    BP has been lower, + orthostatic hypotension.    HR is irregular.  Saw Dr. [name] last week.  Pt with arm pain bil. Along with chest pain.  Unsure if muscular skeletal or cardiac.    Pt has also had low BP before he is out of bed in AM in 80s.  His BB is down to 12.5 daily.    Today he actually looks pretty good.  His SOB is stable and occ chest pain but more of a glowing tingling across chest , occ sharp pain, only last a couple of minutes.  He does have pain in both arms he believes to be muscular skeletal.    BP here today is elevated and HR 105.  He has hydralazine only PRN if needed for HTN. Dr. [name] added spironolactone at 50 mg for lower ext edema.  Pt has not taken due to feeling ill with it before.  Pt  wears support stockings,  He would like to try to continue to exercise.  He is frustrated with his weakness.  Most recent CXR was stable.    Pt read an article on [facility] discussed he did not have cardiomyopathy.   He also has skipped beats and HH RN was concerned - pt is not aware of them until he checks his pulse.   Past Medical History:  Diagnosis Date  . Arthritis    "generalized" (04/04/2017)  . Asthma   . Basal cell carcinoma    "left ear"  . BPH (benign prostatic hyperplasia)    severe; s/p multiple biopsies  . CAD (coronary artery disease)   . Carotid artery occlusion   . Chronic respiratory failure ([place])   . Chronic rhinitis   . COPD (chronic obstructive pulmonary disease) ([facility])    2L [place] O2  . Diastolic heart failure ([place]) 2019  . Elevated troponin 03/09/2017  . GERD (gastroesophageal reflux disease)   . History of blood transfusion    "w/his CABG" (04/04/2017)  . History of kidney stones   . Hyperlipidemia   . Hypertension   . On home oxygen therapy    "~ 24/7" (04/04/2017)  .  Peripheral vascular disease ([place] [facility])   . Pneumonia 2019  . Syncope and collapse   . Ureteral tumor 08/2015   had endoscopic procedure for evaluation, unable to reach for biopsy    Past Surgical History:  Procedure Laterality Date  . BASAL CELL CARCINOMA EXCISION Left    ear  . CARDIAC CATHETERIZATION     "prior to bypass"  . CATARACT EXTRACTION W/ INTRAOCULAR LENS  IMPLANT, BILATERAL Bilateral   . CORONARY ARTERY BYPASS GRAFT  10/2001   LIMA to LAD, SVG to OM1-2, SVG to RCA and PDA  . LITHOTRIPSY    . PROSTATE BIOPSY  Oct. 2014  . TONSILLECTOMY       Current Outpatient Medications  Medication Sig Dispense Refill  . ALPRAZolam (XANAX) 0.25 MG tablet Take half (1/2) tablet by mouth three (3) times daily.    Marland Kitchen aspirin 81 MG tablet Take 81 mg by mouth daily.    Marland Kitchen atorvastatin (LIPITOR) 20 MG tablet TAKE 1 TABLET BY MOUTH EVERY DAY 90 tablet 2  . Cholecalciferol (VITAMIN D3) 2000 units TABS  Take 2,000 Units by mouth daily.    . clobetasol cream (TEMOVATE) 1.44 % Apply 1 application topically as directed.    . Cyanocobalamin (VITAMIN B 12 PO) Take 1 tablet daily by mouth.     . finasteride (PROSCAR) 5 MG tablet Take 5 mg by mouth every morning.    . furosemide (LASIX) 20 MG tablet Take 1 tablet (20 mg total) by mouth daily. Can take one additional pill if needed for swelling. 45 tablet 1  . guaiFENesin (MUCINEX) 600 MG 12 hr tablet Take 600 mg by mouth 2 (two) times daily as needed for cough.     . hydrALAZINE (APRESOLINE) 25 MG tablet Take 25 mg by mouth every 6 (six) hours as needed.    . levalbuterol (XOPENEX) 0.63 MG/3ML nebulizer solution USE ONE VIAL VIA NEBULIZER EVERY 4 HOURS AS NEEDED FOR WHEEZING OR SHORTNESS OF BREATH 300 mL 3  . metoprolol succinate (TOPROL-XL) 25 MG 24 hr tablet Take 0.5 tablets (12.5 mg total) by mouth daily. 45 tablet 3  . mometasone (NASONEX) 50 MCG/ACT nasal spray SHAKE LIQUID AND USE 2 SPRAYS IN EACH NOSTRIL DAILY 17 g 11  . Multiple Vitamins-Minerals (PRESERVISION AREDS 2) CAPS Take 1 capsule by mouth daily.    . OXYGEN Inhale 2 L continuous into the lungs.     . pantoprazole (PROTONIX) 40 MG tablet Take 40 mg by mouth daily.    . predniSONE (DELTASONE) 10 MG tablet Take 1-2 tablets (10-20 mg total) by mouth daily with breakfast. 60 tablet 2  . ranitidine (ZANTAC) 150 MG tablet Take 150 mg by mouth 2 (two) times daily.  0  . SPIRIVA RESPIMAT 2.5 MCG/ACT AERS Inhale 2 puffs into the lungs daily.  11  . spironolactone (ALDACTONE) 50 MG tablet Take 1 tablet (50 mg total) by mouth daily. 30 tablet 11  . SYMBICORT 160-4.5 MCG/ACT inhaler INHALE 2 PUFFS INTO THE LUNGS EVERY MORNING AND ANOTHER 2 PUFFS ABOUT 12 HOURS LATER 10.2 g 11  . XOPENEX HFA 45 MCG/ACT inhaler INHALE 2 PUFFS BY MOUTH EVERY 4 HOURS AS NEEDED FOR WHEEZING OR SHORTNESS OF BREATH 15 g 11   No current facility-administered medications for this visit.     Allergies:   Cefdinir;  Nitrofurantoin; Sulfa antibiotics; Sulfonamide derivatives; Cefdinir; Ciprofloxacin; Nitrofurantoin; Ciprofloxacin; Levaquin [levofloxacin]; and Levaquin [levofloxacin]    Social History:  The patient  reports that he quit smoking about 35  years ago. His smoking use included cigarettes. He has a 66.00 pack-year smoking history. He has never used smokeless tobacco. He reports that he drinks alcohol. He reports that he does not use drugs.   Family History:  The patient's family history includes Allergies in his brother; Breast cancer in his mother; Cancer in his mother; Heart attack in his father; Heart disease in his brother, brother, father, and mother; Hyperlipidemia in his brother; Hypertension in his brother and mother; Other in his mother; Peripheral vascular disease in his brother; Stroke in his brother.    ROS:  General:no colds or fevers, no weight changes, + general wekness Skin:no rashes or ulcers HEENT:no blurred vision, no congestion CV:see HPI PUL:see HPI GI:no diarrhea constipation or melena, no indigestion GU:no hematuria, no dysuria MS:no joint pain, no claudication Neuro:no syncope, no lightheadedness Endo:no diabetes, no thyroid disease  Wt Readings from Last 3 Encounters:  06/01/17 128 lb 12 oz (58.4 kg)  05/25/17 134 lb (60.8 kg)  05/23/17 128 lb 11.2 oz (58.4 kg)     PHYSICAL EXAM: VS:  BP (!) 162/74   Pulse (!) 109   Ht 5\' 9"  (1.753 m)   Wt 128 lb 12 oz (58.4 kg)   SpO2 96%   BMI 19.01 kg/m  , BMI Body mass index is 19.01 kg/m. General:Pleasant affect, NAD Skin:Warm and dry, brisk capillary refill HEENT:normocephalic, sclera clear, mucus membranes moist Neck:supple, no JVD, no bruits  Heart:S1S2 RRR without murmur, gallup, rub or click Lungs:clear without rales, rhonchi, or wheezes QQI:WLNL, non tender, + BS, do not palpate liver spleen or masses Ext:2-3+ lower ext edema,  2+ radial pulses Neuro:alert and oriented X 3, MAE, follows commands, + facial  symmetry    EKG:  EKG is ordered today. The ekg ordered today demonstrates ST at 105 with RBBB which is chronic.  No acute changes.     Recent Labs: 03/08/2017: TSH 1.851 03/21/2017: NT-Pro BNP 1,372 03/24/2017: B Natriuretic Peptide 190.3 04/04/2017: ALT 20 04/05/2017: Hemoglobin 11.5; Platelets 193 06/01/2017: BUN 19; Creatinine, Ser 0.99; Magnesium 2.4; Potassium 3.7; Sodium 139    Lipid Panel    Component Value Date/Time   CHOL 146 03/26/2017 0621   TRIG 34 03/26/2017 0621   HDL 79 03/26/2017 0621   CHOLHDL 1.8 03/26/2017 0621   VLDL 7 03/26/2017 0621   LDLCALC 60 03/26/2017 0621       Other studies Reviewed: Additional studies/ records that were reviewed today include:  Echo 03/09/17. Study Conclusions  - Left ventricle: The cavity size was normal. Systolic function was   normal. The estimated ejection fraction was in the range of 60%   to 65%. Wall motion was normal; there were no regional wall   motion abnormalities. Doppler parameters are consistent with   abnormal left ventricular relaxation (grade 1 diastolic   dysfunction). - Aortic valve: Transvalvular velocity was within the normal range.   There was no stenosis. There was no regurgitation. - Mitral valve: Transvalvular velocity was within the normal range.   There was no evidence for stenosis. There was trivial   regurgitation. - Right ventricle: The cavity size was normal. Wall thickness was   normal. Systolic function was reduced. - Tricuspid valve: There was mild regurgitation. - Pulmonary arteries: Systolic pressure was within the normal   range. PA peak pressure: 24 mm Hg (S). - Pericardium, extracardiac: A trivial pericardial effusion was   identified.  nuc study  03/12/17  FINDINGS: Perfusion: Large infarct involving the inferior wall including  the inferior apex. Difficult to exclude peri-infarct ischemia along the apex. No other areas are concerning for reversible ischemia.  Wall Motion:  Paradoxical motion along the base of the inferior wall. Hypokinesia along the septal wall which may be secondary to the CABG procedure.  Left Ventricular Ejection Fraction: 46 %  End diastolic volume 78 ml  End systolic volume 43 ml  IMPRESSION: 1. Large infarct involving the inferior wall including the inferior apex. Difficult to exclude peri-infarct ischemia at the apex and recommend correlation with ECG testing.  2. Abnormal wall motion along the inferior wall base and septal wall as described.  3. Left ventricular ejection fraction is 48%.  4. Non invasive risk stratification*: Intermediate   ASSESSMENT AND PLAN:  1.  Chest pain, this is most likely muscular skeletal.  His BP while up here at home runs low. So I asked him not to take NTG.   He cannot take ranexa.  He is not candidate for intervention.  2.  Orthostatic hypotension.  Even without getting out of bed his BP is in the 80s at times.  He is on low dose BB only.  BP here is elevated.  Would allow some hypertension to prevent hypo.  He is on lasix and continues with lower ext edema.    3.   Chronic diastolic HF with 2+ lower ext edema.  Dr. [name] added aldactone at 50 mg daily, pt has not taken he did not feel well on this medication previously.  It would help lower ext edema, he is wearing support stockings. Will check with Dr. Tamala Julian may be able to begin at low dose.  For now he will take extra lasix if needed.  4.  CAD s/p CABG see above  5.  Cor pulmonale with COPD is followed by Dr. [name].  Uses oxygen.  He will continue to exercise  6.  HLD weakness so he held statin for 2 weeks without change.    7.  Irregular HR, will have him wear 2 week event monitor - these may be PACs but would like to rule out a fib.   Will have him follow up with Dr. Tamala Julian or myself pt frustrated with no energy.      Current medicines are reviewed with the patient today.  The patient Has no concerns regarding medicines.  The  following changes have been made:  See above Labs/ tests ordered today include:see above  Disposition:   FU:  see above  Signed, Cecilie Kicks, NP  06/01/2017 5:28 PM    [facility] [address], [place], [place], [place] Phone: [phone]; Fax: [phone]

## 2017-06-02 ENCOUNTER — Other Ambulatory Visit: Payer: Self-pay | Admitting: Internal Medicine

## 2017-06-02 NOTE — Telephone Encounter (Signed)
Agree with recommendation

## 2017-06-05 ENCOUNTER — Ambulatory Visit: Payer: Medicare Other | Admitting: Internal Medicine

## 2017-06-05 ENCOUNTER — Ambulatory Visit: Payer: Medicare Other | Admitting: Cardiology

## 2017-06-05 ENCOUNTER — Telehealth: Payer: Self-pay | Admitting: Cardiology

## 2017-06-05 NOTE — Progress Notes (Signed)
Pt has been made aware of normal result and verbalized understanding.  jw 06/05/17

## 2017-06-05 NOTE — Telephone Encounter (Signed)
Returned pts call and discussed his lab results. See result note. 

## 2017-06-05 NOTE — Telephone Encounter (Signed)
New Message ° ° ° °Patient is returning call in reference to lab results. Please call.  °

## 2017-06-06 DIAGNOSIS — I2510 Atherosclerotic heart disease of native coronary artery without angina pectoris: Secondary | ICD-10-CM | POA: Diagnosis not present

## 2017-06-06 DIAGNOSIS — I739 Peripheral vascular disease, unspecified: Secondary | ICD-10-CM | POA: Diagnosis not present

## 2017-06-06 DIAGNOSIS — J441 Chronic obstructive pulmonary disease with (acute) exacerbation: Secondary | ICD-10-CM | POA: Diagnosis not present

## 2017-06-06 DIAGNOSIS — J9611 Chronic respiratory failure with hypoxia: Secondary | ICD-10-CM | POA: Diagnosis not present

## 2017-06-06 DIAGNOSIS — J9612 Chronic respiratory failure with hypercapnia: Secondary | ICD-10-CM | POA: Diagnosis not present

## 2017-06-06 DIAGNOSIS — I10 Essential (primary) hypertension: Secondary | ICD-10-CM | POA: Diagnosis not present

## 2017-06-07 ENCOUNTER — Other Ambulatory Visit: Payer: Medicare HMO | Admitting: Licensed Clinical Social Worker

## 2017-06-07 ENCOUNTER — Other Ambulatory Visit: Payer: Medicare HMO | Admitting: *Deleted

## 2017-06-07 DIAGNOSIS — Z515 Encounter for palliative care: Secondary | ICD-10-CM

## 2017-06-07 NOTE — Progress Notes (Signed)
COMMUNITY PALLIATIVE CARE SW NOTE  PATIENT NAME: [name] DOB: 30-Mar-1929 MRN: [id number]  PRIMARY CARE PROVIDER: [name], MD  RESPONSIBLE PARTY:  Acct ID - Guarantor Home Phone Work Phone Relationship Acct Type  [id number] - [name],[name] [phone]  Self P/F     [address], [name], [place] [address]     PLAN OF CARE and INTERVENTIONS:             1. GOALS OF CARE/ ADVANCE CARE PLANNING:  Patient wishes to remain as independent as possible.  He would also like to avoid hospitalizations.  Patient is a full code. 2. SOCIAL/EMOTIONAL/SPIRITUAL ASSESSMENT/ INTERVENTIONS:  Patient is an 84 y/o caucasian married male who resides at home with his wife.  They have lived in [place] their entire lives and moved to [place] about 15 years ago.  They have a daughter who used to live across the street, but is now in [place].  Patient's son is a Stage manager and lives in [place], [place].  Patient retired from [employer].  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  Patient's wife stated he was very fulfilled doing this.  SW and Palliative Care RN, [name], met with patient and his wife.  Patient appeared visibly out of breath during visit.  He used pursed breathing.  He stated he gets tired just combing his hair.  They are Episcopalian but do not belong to a church.  Patient's wife stated patient is very social and spiritual.  SW provided active listening and supportive counseling.  Patient's wife stated she would like visits every week.  The couple live in a three level home and the stairs are becoming challenging.  Their daughter still owns the home across the street which is one level.  They have considered moving there and have also explored continuous care communities. 3. PATIENT/CAREGIVER EDUCATION/ COPING:  Patient copes by expressing his feelings appropriately.  SW and RN provided education regarding Palliative Care and discipline  roles.  Patient and his wife stated they understood. 4. PERSONAL EMERGENCY PLAN:  Patient stated he uses his inhaler and nebulizer for shortness of breath.  Suggested patient [place] a fan also.  He stated he does not like wind on his face, but that he would try a fan if it would help him. 5. COMMUNITY RESOURCES COORDINATION/ HEALTH CARE NAVIGATION:  [facility] is currently seeing patient. 6. FINANCIAL/LEGAL CONCERNS/INTERVENTIONS:  None per patient's wife.     SOCIAL HX:  Social History   Tobacco Use  . Smoking status: Former Smoker    Packs/day: 2.00    Years: 33.00    Pack years: 66.00    Types: Cigarettes    Last attempt to quit: 03/25/1982    Years since quitting: 35.2  . Smokeless tobacco: Never Used  Substance Use Topics  . Alcohol use: Yes    Alcohol/week: 0.0 oz    Comment: 1.5-3 oz daily in the past, minimal use in the last few months    CODE STATUS:  Full Code  ADVANCED DIRECTIVES: N MOST FORM COMPLETE:  MOST form was discussed with the NP, but patient has chosen to not sign it per his wife. HOSPICE EDUCATION PROVIDED:  SW and RN provided Hospice education to patient's wife per her request.  She stated patient is not ready for Hospice.  PPS:  Patient is able to stand and ambulate independently.  His intake is currently normal. Duration of visit and documentation:  110 minutes.      Creola Corn Westlee Devita, LCSW

## 2017-06-07 NOTE — Progress Notes (Signed)
COMMUNITY PALLIATIVE CARE RN NOTE  PATIENT NAME: SHAMEL GERMOND DOB: [birth year] MRN: [id number]  PRIMARY CARE PROVIDER: [name], MD  RESPONSIBLE PARTY:  Acct ID - Guarantor Home Phone Work Phone Relationship Acct Type  [id number] - [place],Charleton L [phone]  Self P/F     [name], [place] [address]    PLAN OF CARE and INTERVENTION:  1. ADVANCE CARE PLANNING/GOALS OF CARE: Goal is to stay at home for as long as possible. Considered nursing home when condition worsens but expense is too high. 2. PATIENT/CAREGIVER EDUCATION: Explained Palliative Services, Reinforced Safety/Fall Precautions 3. DISEASE STATUS: Joint visit made with [place]. Wife present during visit. Wife explained how she feels that patient's condition continues to deteriorate overall. Wife concerned about patient's low BPs in the mornings when patient wakes up. Averaging 90s/60s to 80s/50s. Cardiologist recently prescribed Aldactone for patient, however patient has not taken as of yet due to low BP readings. Often feels very weak and tired. Also reports intermittent dizziness. Patient is scheduled to see Cardiologist next week for a Holter monitor. Patient reports that this am that he was experiencing increased dyspnea requiring PRN Xopenex. Mainly utilizing the inhaler, but when dyspnea isn't relieved by inhaler, will then use nebulizer treatment which is helpful. Patient remains on continuous Oxygen at 2L/min via [place]. Dyspnea noted during conversation and minimal exertion. Often purse-lip breathing noticed. Continues with Trilogy at bedtime. Reports horrible, persistent nasal congestion. Patient states that he has tried several allergy medications, however they have not been very effective. Also takes Nasonex. Patient feels Flonase contributes to his "dizzy" feeling. Cough somewhat productive. Sputum appears white and frothy. Intake has been picking up over the past few weeks. Weight has been  stable. Weighs daily. Patient used to work with PT, however he has been discharged since he is no longer progressing with program. Feels he is losing significant amount of muscle mass, but is performing exercises given to him by PT regularly. Patient remains ambulatory and able to perform ADLs independently at this time. Wife states patient is a very independent person. Starting to have some difficulty walking up the stairs in his home and wife unsure as to how much longer patient will be able to manage this. Also having more difficulty hearing. Has bilateral hearing aids but states that he has been too ill to have them evaluated, which is usually done at [facility]. Starting the past 3 weeks, patient has not left his home unless going to his doctor's appointments.     HISTORY OF PRESENT ILLNESS:  This is a 84 yo male currently being followed by Palliative Care Services to assist with symptom management needs.  CODE STATUS: FULL CODE ADVANCED DIRECTIVES: Y MOST FORM: no PPS: 50%   PHYSICAL EXAM:   VITALS: BP 108/60, P 96, R 20, O2 97% 2L/min via [place]  LUNGS: decreased breath sounds CARDIAC: Cor irreg, irreg RRR EXTREMITIES: Edema noted bilaterally, L >R, non-pitting SKIN: Exposed skin dry and intact, frail, easily bruised  NEURO: Alert and oriented x 3, pleasant mood and engaging, ambulates independently, generalized weakness   (Duration of visit and documentation 1 hr 45 minutes)    [name], RN, BSN

## 2017-06-08 ENCOUNTER — Encounter: Payer: Self-pay | Admitting: *Deleted

## 2017-06-08 DIAGNOSIS — I739 Peripheral vascular disease, unspecified: Secondary | ICD-10-CM | POA: Diagnosis not present

## 2017-06-08 DIAGNOSIS — I2510 Atherosclerotic heart disease of native coronary artery without angina pectoris: Secondary | ICD-10-CM | POA: Diagnosis not present

## 2017-06-08 DIAGNOSIS — I10 Essential (primary) hypertension: Secondary | ICD-10-CM | POA: Diagnosis not present

## 2017-06-08 DIAGNOSIS — J441 Chronic obstructive pulmonary disease with (acute) exacerbation: Secondary | ICD-10-CM | POA: Diagnosis not present

## 2017-06-08 DIAGNOSIS — J9611 Chronic respiratory failure with hypoxia: Secondary | ICD-10-CM | POA: Diagnosis not present

## 2017-06-08 DIAGNOSIS — J9612 Chronic respiratory failure with hypercapnia: Secondary | ICD-10-CM | POA: Diagnosis not present

## 2017-06-12 ENCOUNTER — Ambulatory Visit (INDEPENDENT_AMBULATORY_CARE_PROVIDER_SITE_OTHER): Payer: Medicare Other

## 2017-06-12 DIAGNOSIS — R002 Palpitations: Secondary | ICD-10-CM | POA: Diagnosis not present

## 2017-06-13 DIAGNOSIS — J9611 Chronic respiratory failure with hypoxia: Secondary | ICD-10-CM | POA: Diagnosis not present

## 2017-06-13 DIAGNOSIS — I739 Peripheral vascular disease, unspecified: Secondary | ICD-10-CM | POA: Diagnosis not present

## 2017-06-13 DIAGNOSIS — K219 Gastro-esophageal reflux disease without esophagitis: Secondary | ICD-10-CM | POA: Diagnosis not present

## 2017-06-13 DIAGNOSIS — I25110 Atherosclerotic heart disease of native coronary artery with unstable angina pectoris: Secondary | ICD-10-CM | POA: Diagnosis not present

## 2017-06-13 DIAGNOSIS — Z87891 Personal history of nicotine dependence: Secondary | ICD-10-CM | POA: Diagnosis not present

## 2017-06-13 DIAGNOSIS — Z7982 Long term (current) use of aspirin: Secondary | ICD-10-CM | POA: Diagnosis not present

## 2017-06-13 DIAGNOSIS — I110 Hypertensive heart disease with heart failure: Secondary | ICD-10-CM | POA: Diagnosis not present

## 2017-06-13 DIAGNOSIS — J9612 Chronic respiratory failure with hypercapnia: Secondary | ICD-10-CM | POA: Diagnosis not present

## 2017-06-13 DIAGNOSIS — Z7952 Long term (current) use of systemic steroids: Secondary | ICD-10-CM | POA: Diagnosis not present

## 2017-06-13 DIAGNOSIS — J441 Chronic obstructive pulmonary disease with (acute) exacerbation: Secondary | ICD-10-CM | POA: Diagnosis not present

## 2017-06-13 DIAGNOSIS — E785 Hyperlipidemia, unspecified: Secondary | ICD-10-CM | POA: Diagnosis not present

## 2017-06-13 DIAGNOSIS — I5042 Chronic combined systolic (congestive) and diastolic (congestive) heart failure: Secondary | ICD-10-CM | POA: Diagnosis not present

## 2017-06-13 DIAGNOSIS — Z951 Presence of aortocoronary bypass graft: Secondary | ICD-10-CM | POA: Diagnosis not present

## 2017-06-13 DIAGNOSIS — Z7951 Long term (current) use of inhaled steroids: Secondary | ICD-10-CM | POA: Diagnosis not present

## 2017-06-13 DIAGNOSIS — N400 Enlarged prostate without lower urinary tract symptoms: Secondary | ICD-10-CM | POA: Diagnosis not present

## 2017-06-14 DIAGNOSIS — F322 Major depressive disorder, single episode, severe without psychotic features: Secondary | ICD-10-CM | POA: Diagnosis not present

## 2017-06-19 DIAGNOSIS — I739 Peripheral vascular disease, unspecified: Secondary | ICD-10-CM | POA: Diagnosis not present

## 2017-06-19 DIAGNOSIS — J441 Chronic obstructive pulmonary disease with (acute) exacerbation: Secondary | ICD-10-CM | POA: Diagnosis not present

## 2017-06-19 DIAGNOSIS — J9611 Chronic respiratory failure with hypoxia: Secondary | ICD-10-CM | POA: Diagnosis not present

## 2017-06-19 DIAGNOSIS — I110 Hypertensive heart disease with heart failure: Secondary | ICD-10-CM | POA: Diagnosis not present

## 2017-06-19 DIAGNOSIS — I5042 Chronic combined systolic (congestive) and diastolic (congestive) heart failure: Secondary | ICD-10-CM | POA: Diagnosis not present

## 2017-06-19 DIAGNOSIS — I25110 Atherosclerotic heart disease of native coronary artery with unstable angina pectoris: Secondary | ICD-10-CM | POA: Diagnosis not present

## 2017-06-20 NOTE — Progress Notes (Signed)
@Patient  ID: [name], male    DOB: December 02, 1929, 84 y.o.   MRN: [id number]  Chief Complaint  Patient presents with  . Acute Visit    Increased SOB, generalized weakness, increased green mucous production, Started zpack yesterday. Chest tightness and discomfort. BLE edema worse than usual.     Referring provider: [name], MD  HPI: 51 yowm quit smoking in 1984 with GOLD IV/ 02 dep and steroid dep copd place on NIV by Dr Larkin Ina with profound chronic FTT not improved on NIV   Recent [facility] Encounters:     05/25/2017  f/u ov/[place] re:  Chief Complaint  Patient presents with  . Follow-up    Increased SOB and  fatigue x last week, 2.5L pulsed, compliant with spiriva, symbicort, and xopenex.    Dyspnea:  At rest/ worse walking 10 ft  Cough: none Sleep: on trilogy ok SABA use:  Confused with saba hfa vs neb   No obvious day to day or daytime variability or assoc excess/ purulent sputum or mucus plugs or hemoptysis or cp or chest tightness, subjective wheeze or overt   hb symptoms. No unusual exposure hx or h/o childhood pna/ asthma or knowledge of premature birth.  Sleeping on trilogy ok   without nocturnal  or early am exacerbation  of respiratory  c/o's or need for noct saba. Also denies any obvious fluctuation of symptoms with weather or environmental changes or other aggravating or alleviating factors except as outlined above     Tests:  05/25/2017-chest x-ray-no acute abnormality seen 04/04/2017-EEG- EEG is normal 03/09/2017-echocardiogram-LV ejection fraction 60 to 29%, grade 1 diastolic dysfunction  Imaging:   Cardiac:   Labs:   Micro:   Chart Review:    06/21/17  84 year old patient presenting today with 4 to 5 days of fatigue, sinus congestion, sinus pain, headache.  Patient reporting still using 10 mg daily of prednisone.  Unfortunately patient still with lower extremity swelling bilaterally and has not started Aldactone as cardiology has not  followed up with the patient to see if they can start.  The cardiologist has been called multiple times per spouse and patient.  They have not heard back if it is okay to start the Aldactone so the patient has not been started on this.   Allergies  Allergen Reactions  . Cefdinir Other (See Comments)    Severe Diarrhea  . Nitrofurantoin Swelling    Hand Swelling  . Sulfa Antibiotics Anaphylaxis and Swelling  . Sulfonamide Derivatives Swelling    REACTION: facial/tongue swelling  . Cefdinir Diarrhea    REACTION: diarrhea  . Ciprofloxacin Itching and Rash    Red itchy hands  . Nitrofurantoin Swelling    Swollen hands  . Ciprofloxacin Itching and Rash  . Levaquin [Levofloxacin] Itching and Rash  . Levaquin [Levofloxacin] Itching and Rash    Immunization History  Administered Date(s) Administered  . Influenza Split 10/10/2010, 10/04/2011, 10/14/2013  . Influenza Whole 10/01/2007, 11/06/2008, 11/09/2009  . Influenza, High Dose Seasonal PF 11/07/2016  . Influenza,inj,Quad PF,6+ Mos 09/30/2012, 10/19/2014  . Influenza-Unspecified 09/07/2015  . Pneumococcal Conjugate-13 08/27/2013  . Pneumococcal Polysaccharide-23 12/11/2001, 05/20/2015    Past Medical History:  Diagnosis Date  . Arthritis    "generalized" (04/04/2017)  . Asthma   . Basal cell carcinoma    "left ear"  . BPH (benign prostatic hyperplasia)    severe; s/p multiple biopsies  . CAD (coronary artery disease)   . Carotid artery occlusion   . Chronic  respiratory failure ([place])   . Chronic rhinitis   . COPD (chronic obstructive pulmonary disease) (HCC)    2L [place] O2  . Diastolic heart failure ([place]) 2019  . Elevated troponin 03/09/2017  . GERD (gastroesophageal reflux disease)   . History of blood transfusion    "w/his CABG" (04/04/2017)  . History of kidney stones   . Hyperlipidemia   . Hypertension   . On home oxygen therapy    "~ 24/7" (04/04/2017)  . Peripheral vascular disease ([place])   . Pneumonia 2019  .  Syncope and collapse   . Ureteral tumor 08/2015   had endoscopic procedure for evaluation, unable to reach for biopsy    Tobacco History: Social History   Tobacco Use  Smoking Status Former Smoker  . Packs/day: 2.00  . Years: 33.00  . Pack years: 66.00  . Types: Cigarettes  . Last attempt to quit: 03/25/1982  . Years since quitting: 35.2  Smokeless Tobacco Never Used   Counseling given: Yes Continue not smoking.  Outpatient Encounter Medications as of 06/21/2017  Medication Sig  . ALPRAZolam (XANAX) 0.25 MG tablet Take half (1/2) tablet by mouth three (3) times daily.  Marland Kitchen aspirin 81 MG tablet Take 81 mg by mouth daily.  Marland Kitchen atorvastatin (LIPITOR) 20 MG tablet TAKE 1 TABLET BY MOUTH EVERY DAY  . Cholecalciferol (VITAMIN D3) 2000 units TABS Take 2,000 Units by mouth daily.  . clobetasol cream (TEMOVATE) 4.48 % Apply 1 application topically as directed.  . Cyanocobalamin (VITAMIN B 12 PO) Take 1 tablet daily by mouth.   . finasteride (PROSCAR) 5 MG tablet Take 5 mg by mouth every morning.  . furosemide (LASIX) 20 MG tablet TAKE 1 TABLET BY MOUTH DAILY. CAN TAKE ADDITIONAL PILL IF NEEDED FOR SWELLING  . guaiFENesin (MUCINEX) 600 MG 12 hr tablet Take 600 mg by mouth 2 (two) times daily as needed for cough.   . hydrALAZINE (APRESOLINE) 25 MG tablet Take 25 mg by mouth every 6 (six) hours as needed.  . levalbuterol (XOPENEX) 0.63 MG/3ML nebulizer solution USE ONE VIAL VIA NEBULIZER EVERY 4 HOURS AS NEEDED FOR WHEEZING OR SHORTNESS OF BREATH  . metoprolol succinate (TOPROL-XL) 25 MG 24 hr tablet Take 0.5 tablets (12.5 mg total) by mouth daily.  . mometasone (NASONEX) 50 MCG/ACT nasal spray SHAKE LIQUID AND USE 2 SPRAYS IN EACH NOSTRIL DAILY  . Multiple Vitamins-Minerals (PRESERVISION AREDS 2) CAPS Take 1 capsule by mouth daily.  . OXYGEN Inhale 2 L continuous into the lungs.   . pantoprazole (PROTONIX) 40 MG tablet Take 40 mg by mouth daily.  . predniSONE (DELTASONE) 10 MG tablet Take 1-2  tablets (10-20 mg total) by mouth daily with breakfast.  . ranitidine (ZANTAC) 150 MG tablet Take 150 mg by mouth 2 (two) times daily.  Marland Kitchen SPIRIVA RESPIMAT 2.5 MCG/ACT AERS Inhale 2 puffs into the lungs daily.  . SYMBICORT 160-4.5 MCG/ACT inhaler INHALE 2 PUFFS INTO THE LUNGS EVERY MORNING AND ANOTHER 2 PUFFS ABOUT 12 HOURS LATER  . XOPENEX HFA 45 MCG/ACT inhaler INHALE 2 PUFFS BY MOUTH EVERY 4 HOURS AS NEEDED FOR WHEEZING OR SHORTNESS OF BREATH  . doxycycline (VIBRA-TABS) 100 MG tablet Take 1 tablet (100 mg total) by mouth 2 (two) times daily.  Marland Kitchen spironolactone (ALDACTONE) 50 MG tablet Take 1 tablet (50 mg total) by mouth daily. (Patient not taking: Reported on 06/21/2017)   No facility-administered encounter medications on file as of 06/21/2017.      Review of Systems  Constitutional: +  fatigue, body aches    No  weight loss, night sweats,  fevers, chills HEENT:  +nasal congestion, sinus pain, occasional HA    No headaches,  Difficulty swallowing,  Tooth/dental problems, or  Sore throat, No sneezing, itching, ear ache, nasal congestion, post nasal drip  CV: +orthostatic hypotension  No chest pain,  orthopnea, PND, swelling in lower extremities, anasarca, dizziness, palpitations, syncope  GI: No heartburn, indigestion, abdominal pain, nausea, vomiting, diarrhea, change in bowel habits, loss of appetite, bloody stools Resp: +sob with exertion, occasionally when talking, cough with occasional green/white mucous  No shortness of breath at rest.  No coughing up of blood.  No wheezing.  No chest wall deformity Skin: no rash, lesions, no skin changes. GU: no dysuria, change in color of urine, no urgency or frequency.  No flank pain, no hematuria  MS:  +weakness   No joint pain or swelling.  No decreased range of motion.  No back pain. Psych:  No change in mood or affect. No depression or anxiety.  No memory loss.   Physical Exam  BP 128/70   Pulse 85   Temp (!) 97.5 F (36.4 C) (Oral)   Ht 5'  9" (1.753 m)   Wt 133 lb (60.3 kg)   SpO2 95%   BMI 19.64 kg/m     Wt Readings from Last 3 Encounters:  06/21/17 133 lb (60.3 kg)  06/07/17 129 lb 9.6 oz (58.8 kg)  06/01/17 128 lb 12 oz (58.4 kg)     GEN: A/Ox3; pleasant , NAD, well nourished, chronically ill    HEENT:  [place]/AT,  EACs-clear, TMs-wnl, NOSE-clear, THROAT- +post nasal drip, Sinus - Tender to palpation left maxillary pain    NECK:  Supple w/ fair ROM; no JVD; normal carotid impulses w/o bruits; no thyromegaly or nodules palpated; no lymphadenopathy.    RESP:  + exp wheeze, slight left lobe rale, diminished breath sounds throughout, barrel chest  no accessory muscle use, no dullness to percussion  CARD:  +appears to have 1+ edema bilaterally in LE, pt wearing compression stockings so difficult to fully assess,  RRR, no m/r/g, pulses intact, no cyanosis or clubbing.  GI:   Soft & nt; hyperactive bs; no organomegaly or masses detected.   Musco: Warm bil, no deformities or joint swelling noted.   Neuro: alert, no focal deficits noted.    Skin: Warm, no lesions or rashes    Lab Results:  CBC    Component Value Date/Time   WBC 7.1 04/05/2017 0612   RBC 3.63 (L) 04/05/2017 0612   HGB 11.5 (L) 04/05/2017 0612   HCT 35.2 (L) 04/05/2017 0612   PLT 193 04/05/2017 0612   MCV 97.0 04/05/2017 0612   MCH 31.7 04/05/2017 0612   MCHC 32.7 04/05/2017 0612   RDW 12.7 04/05/2017 0612   LYMPHSABS 0.5 (L) 04/04/2017 0056   MONOABS 0.7 04/04/2017 0056   EOSABS 0.0 04/04/2017 0056   BASOSABS 0.0 04/04/2017 0056    BMET    Component Value Date/Time   NA 139 06/01/2017 1232   K 3.7 06/01/2017 1232   CL 94 (L) 06/01/2017 1232   CO2 30 (H) 06/01/2017 1232   GLUCOSE 96 06/01/2017 1232   GLUCOSE 88 04/05/2017 0612   BUN 19 06/01/2017 1232   CREATININE 0.99 06/01/2017 1232   CALCIUM 9.7 06/01/2017 1232   GFRNONAA 69 06/01/2017 1232   GFRAA 80 06/01/2017 1232    BNP    Component Value Date/Time   BNP  190.3 (H)  03/24/2017 2131    ProBNP    Component Value Date/Time   PROBNP 1,372 (H) 03/21/2017 1532   PROBNP 71.0 04/15/2015 1118    Imaging: Dg Chest 2 View  Result Date: 06/21/2017 CLINICAL DATA:  COPD. Chronic respiratory failure. Dyspnea and cough for 1 month. Former smoker. EXAM: CHEST - 2 VIEW COMPARISON:  Insert dated 05/25/2017 FINDINGS: Intact sternotomy wires. Metallic device overlies medial ventral left chest. Stable cardiomediastinal silhouette with normal heart size. No pneumothorax. No pleural effusion. Hyperinflated lungs. Emphysema. No pulmonary edema. No acute consolidative airspace disease. Stable minimal scarring at the costophrenic angles. IMPRESSION: 1. No acute cardiopulmonary disease. 2. Hyperinflated lungs and emphysema, compatible with the provided history of COPD. Stable minimal scarring at the costophrenic angles. Electronically Signed   By: Ilona Sorrel M.D.   On: 06/21/2017 11:46   Dg Chest 2 View  Result Date: 05/26/2017 CLINICAL DATA:  Acute shortness of breath 5 days. EXAM: CHEST - 2 VIEW COMPARISON:  04/04/2017 and prior radiographs FINDINGS: The cardiomediastinal silhouette is unremarkable. CABG changes again noted. COPD/emphysema changes identified. There is no evidence of focal airspace disease, pulmonary edema, suspicious pulmonary nodule/mass, pleural effusion, or pneumothorax. No acute bony abnormalities are identified. IMPRESSION: COPD/emphysema without evidence of acute cardiopulmonary disease. Electronically Signed   By: Margarette Canada M.D.   On: 05/26/2017 18:28     Assessment & Plan:   Pleasant 84 year old patient seen in office today.  Acute sinusitis.  Patient has a past medical history for chronic sinusitis with extensive ENT work-up.  Will treat with doxycycline today as well as increase prednisone to 20 mg daily for the next week and then come back down to 10 mg which she is on chronically.  We will have patient follow-up with Dr. [name] next week as  previously scheduled.  Patient and spouse will follow up with cardiology to discuss Aldactone dose as well as lower extremity swelling.  Patient to call office if they are running low on prednisone.  As of right now they feel that they can handle going up to the 20 mg and then going back down to the 10 mg without needing a refill.    Rhinitis, chronic Continue intranasal glucocorticoids  Chronic diastolic CHF (congestive heart failure) (HCC) Follow-up with cardiology regarding Aldactone and lower extremity swelling  Sinusitis, chronic Doxycycline 100mg   >>> Take 1 tablet twice a day for 7 days >>> Take with food >>> Can start yogurt or probiotic to help with good gut bacteria  Prednisone 10mg    >>> Take 2 tablets (20mg  total) daily for the next week >>> Take with food    COPD   GOLD IV/ 02/steroid dep  Doxycycline 100mg   >>> Take 1 tablet twice a day for 7 days >>> Take with food >>> Can start yogurt or probiotic to help with good gut bacteria  Prednisone 10mg    >>> Take 2 tablets (20mg  total) daily for the next week >>> Take with food  Chest XRAY  >>> Will complete today and call with results  Follow-up with cardiology regarding Aldactone and lower extremity swelling.  Keep follow-up with Dr. Uvaldo Rising, NP 06/21/2017

## 2017-06-21 ENCOUNTER — Encounter: Payer: Self-pay | Admitting: Pulmonary Disease

## 2017-06-21 ENCOUNTER — Other Ambulatory Visit: Payer: Self-pay | Admitting: Internal Medicine

## 2017-06-21 ENCOUNTER — Ambulatory Visit (INDEPENDENT_AMBULATORY_CARE_PROVIDER_SITE_OTHER): Payer: Medicare Other | Admitting: Pulmonary Disease

## 2017-06-21 ENCOUNTER — Ambulatory Visit (INDEPENDENT_AMBULATORY_CARE_PROVIDER_SITE_OTHER)
Admission: RE | Admit: 2017-06-21 | Discharge: 2017-06-21 | Disposition: A | Discharge status: Home / Self Care | Payer: Medicare Other | Source: Ambulatory Visit | Attending: Pulmonary Disease | Admitting: Pulmonary Disease

## 2017-06-21 VITALS — BP 128/70 | HR 85 | Temp 97.5°F | Ht 69.0 in | Wt 133.0 lb

## 2017-06-21 DIAGNOSIS — J328 Other chronic sinusitis: Secondary | ICD-10-CM | POA: Diagnosis not present

## 2017-06-21 DIAGNOSIS — J9612 Chronic respiratory failure with hypercapnia: Secondary | ICD-10-CM

## 2017-06-21 DIAGNOSIS — J310 Chronic rhinitis: Secondary | ICD-10-CM | POA: Diagnosis not present

## 2017-06-21 DIAGNOSIS — J9611 Chronic respiratory failure with hypoxia: Secondary | ICD-10-CM

## 2017-06-21 DIAGNOSIS — J449 Chronic obstructive pulmonary disease, unspecified: Secondary | ICD-10-CM

## 2017-06-21 DIAGNOSIS — I5032 Chronic diastolic (congestive) heart failure: Secondary | ICD-10-CM

## 2017-06-21 DIAGNOSIS — G4733 Obstructive sleep apnea (adult) (pediatric): Secondary | ICD-10-CM | POA: Diagnosis not present

## 2017-06-21 DIAGNOSIS — R05 Cough: Secondary | ICD-10-CM | POA: Diagnosis not present

## 2017-06-21 MED ORDER — DOXYCYCLINE HYCLATE 100 MG PO TABS: 100.0000 mg | ORAL_TABLET | Freq: Two times a day (BID) | ORAL | 0 refills | Status: DC

## 2017-06-21 MED ORDER — LEVALBUTEROL TARTRATE 45 MCG/ACT IN AERO: INHALATION_SPRAY | RESPIRATORY_TRACT | 3 refills | Status: DC

## 2017-06-21 NOTE — Progress Notes (Signed)
Chart and office note reviewed in detail  > agree with a/p as outlined    

## 2017-06-21 NOTE — Assessment & Plan Note (Signed)
Follow-up with cardiology regarding Aldactone and lower extremity swelling

## 2017-06-21 NOTE — Progress Notes (Signed)
Your chest x-ray results have come back.  Showing no acute findings.  Is seen.  No changes in plan of care at this time.  Continue doxycycline.  And continue follow-up with Dr. [name] next week.  Wyn Quaker FNP

## 2017-06-21 NOTE — Assessment & Plan Note (Signed)
Continue intranasal glucocorticoids

## 2017-06-21 NOTE — Assessment & Plan Note (Signed)
Doxycycline 100mg   >>> Take 1 tablet twice a day for 7 days >>> Take with food >>> Can start yogurt or probiotic to help with good gut bacteria  Prednisone 10mg    >>> Take 2 tablets (20mg  total) daily for the next week >>> Take with food

## 2017-06-21 NOTE — Progress Notes (Signed)
Spoke with pt and notified of results per Brian. Pt verbalized understanding and denied any questions. °

## 2017-06-21 NOTE — Patient Instructions (Addendum)
Doxycycline 100mg   >>> Take 1 tablet twice a day for 7 days >>> Take with food >>> Can start yogurt or probiotic to help with good gut bacteria  Prednisone 10mg    >>> Take 2 tablets (20mg  total) daily for the next week >>> Take with food  Chest XRAY  >>> Will complete today and call with results  Follow-up with cardiology regarding Aldactone and lower extremity swelling.  Keep follow-up with Dr. [name]      Please contact the office if your symptoms worsen or you have concerns that you are not improving.   Thank you for choosing [facility] for your healthcare, and for allowing [place] to partner with you on your healthcare journey. I am thankful to be able to provide care to you today.   Wyn Quaker FNP-C

## 2017-06-21 NOTE — Assessment & Plan Note (Signed)
Doxycycline 100mg   >>> Take 1 tablet twice a day for 7 days >>> Take with food >>> Can start yogurt or probiotic to help with good gut bacteria  Prednisone 10mg    >>> Take 2 tablets (20mg  total) daily for the next week >>> Take with food  Chest XRAY  >>> Will complete today and call with results  Follow-up with cardiology regarding Aldactone and lower extremity swelling.  Keep follow-up with Dr. [name]

## 2017-06-22 DIAGNOSIS — I25110 Atherosclerotic heart disease of native coronary artery with unstable angina pectoris: Secondary | ICD-10-CM | POA: Diagnosis not present

## 2017-06-22 DIAGNOSIS — J441 Chronic obstructive pulmonary disease with (acute) exacerbation: Secondary | ICD-10-CM | POA: Diagnosis not present

## 2017-06-22 DIAGNOSIS — J9611 Chronic respiratory failure with hypoxia: Secondary | ICD-10-CM | POA: Diagnosis not present

## 2017-06-22 DIAGNOSIS — I110 Hypertensive heart disease with heart failure: Secondary | ICD-10-CM | POA: Diagnosis not present

## 2017-06-22 DIAGNOSIS — I5042 Chronic combined systolic (congestive) and diastolic (congestive) heart failure: Secondary | ICD-10-CM | POA: Diagnosis not present

## 2017-06-22 DIAGNOSIS — I739 Peripheral vascular disease, unspecified: Secondary | ICD-10-CM | POA: Diagnosis not present

## 2017-06-25 ENCOUNTER — Encounter: Payer: Self-pay | Admitting: Cardiology

## 2017-06-25 ENCOUNTER — Ambulatory Visit: Payer: Medicare Other | Admitting: Cardiology

## 2017-06-25 DIAGNOSIS — R531 Weakness: Secondary | ICD-10-CM | POA: Diagnosis not present

## 2017-06-25 DIAGNOSIS — R609 Edema, unspecified: Secondary | ICD-10-CM | POA: Diagnosis not present

## 2017-06-25 DIAGNOSIS — R51 Headache: Secondary | ICD-10-CM | POA: Diagnosis not present

## 2017-06-25 DIAGNOSIS — R0981 Nasal congestion: Secondary | ICD-10-CM | POA: Diagnosis not present

## 2017-06-25 DIAGNOSIS — R05 Cough: Secondary | ICD-10-CM | POA: Diagnosis not present

## 2017-06-26 DIAGNOSIS — I739 Peripheral vascular disease, unspecified: Secondary | ICD-10-CM | POA: Diagnosis not present

## 2017-06-26 DIAGNOSIS — I25110 Atherosclerotic heart disease of native coronary artery with unstable angina pectoris: Secondary | ICD-10-CM | POA: Diagnosis not present

## 2017-06-26 DIAGNOSIS — J441 Chronic obstructive pulmonary disease with (acute) exacerbation: Secondary | ICD-10-CM | POA: Diagnosis not present

## 2017-06-26 DIAGNOSIS — J9611 Chronic respiratory failure with hypoxia: Secondary | ICD-10-CM | POA: Diagnosis not present

## 2017-06-26 DIAGNOSIS — I5042 Chronic combined systolic (congestive) and diastolic (congestive) heart failure: Secondary | ICD-10-CM | POA: Diagnosis not present

## 2017-06-26 DIAGNOSIS — I110 Hypertensive heart disease with heart failure: Secondary | ICD-10-CM | POA: Diagnosis not present

## 2017-06-27 ENCOUNTER — Telehealth: Payer: Self-pay | Admitting: Pharmacist

## 2017-06-27 ENCOUNTER — Ambulatory Visit (INDEPENDENT_AMBULATORY_CARE_PROVIDER_SITE_OTHER): Payer: Medicare Other | Admitting: Internal Medicine

## 2017-06-27 ENCOUNTER — Encounter: Payer: Self-pay | Admitting: Internal Medicine

## 2017-06-27 VITALS — BP 132/62 | HR 90 | Ht 69.0 in | Wt 135.0 lb

## 2017-06-27 DIAGNOSIS — J449 Chronic obstructive pulmonary disease, unspecified: Secondary | ICD-10-CM | POA: Diagnosis not present

## 2017-06-27 DIAGNOSIS — I200 Unstable angina: Secondary | ICD-10-CM | POA: Diagnosis not present

## 2017-06-27 DIAGNOSIS — J9612 Chronic respiratory failure with hypercapnia: Secondary | ICD-10-CM | POA: Diagnosis not present

## 2017-06-27 DIAGNOSIS — I2781 Cor pulmonale (chronic): Secondary | ICD-10-CM

## 2017-06-27 DIAGNOSIS — J9611 Chronic respiratory failure with hypoxia: Secondary | ICD-10-CM

## 2017-06-27 NOTE — Patient Instructions (Addendum)
See calendar for specific medication instructions and bring it back for each and every office visit for every healthcare provider you see.  Without it,  you may not receive the best quality medical care that we feel you deserve.  You will note that the calendar groups together  your maintenance  medications that are timed at particular times of the day.  Think of this as your checklist for what your doctor has instructed you to do until your next evaluation to see what benefit  there is  to staying on a consistent group of medications intended to keep you well.  The other group at the bottom is entirely up to you to use as you see fit  for specific symptoms that may arise between visits that require you to treat them on an as needed basis.  Think of this as your action plan or "what if" list.   Separating the top medications from the bottom group is fundamental to providing you adequate care going forward.     See Tammy NP in 6 weeks with all your medications, even over the counter meds, separated in two separate bags, the ones you take no matter what vs the ones you stop once you feel better and take only as needed when you feel you need them.   Tammy  will generate for you a new user friendly medication calendar that will put [place] all on the same page re: your medication use.       We will request [facility] again

## 2017-06-27 NOTE — Progress Notes (Addendum)
Subjective:     Patient ID: [name], male    DOB: May 08, 1929   MRN: [id number]  Brief patient profile:  87 yowm quit smoking in 1984 with GOLD IV/ 02 dep and steroid dep copd place on NIV by Dr Larkin Ina with profound chronic FTT not improved on NIV   History of Present Illness  05/25/2017  f/u ov/[name] re:  [name] iv copd/ 02 dep  Chief Complaint  Patient presents with  . Follow-up    Increased SOB and  fatigue x last week, 2.5L pulsed, compliant with spiriva, symbicort, and xopenex.   Dyspnea:  At rest/ worse walking 10 ft  Cough: none Sleep: on trilogy ok SABA use:  Confused with saba hfa vs neb  rec Add aldactone 50 mg daily  Continue triegy at your volition Ok to adjust xanax (alprazolam) from one-half to one three times a day to calm your breathing down and relieve anxiety related to breathlessness    06/27/2017  f/u ov/[name] re:  GOLD IV on prednisone 20mg  daily while on doxy Chief Complaint  Patient presents with  . Follow-up    he states he has had a change in his skin color for the past 4 days- face looks flushed.  He states he feels a burning sensation from his shoulders to his knees.  He states his breathing has improved some since his last visit here.   Dyspnea:  MMRC4  = sob if tries to leave home or while getting dressed  But some better since last ov  Cough: improved  Sleep: flat on 2lpm on 2 pillows    SABA use:  Just xopenex hfa, no longer needing neb saba at all since rx doxy and pred to 20 mg daily   No obvious day to day or daytime variability or assoc excess/ purulent sputum or mucus plugs or hemoptysis or cp or chest tightness, subjective wheeze or overt sinus or hb symptoms. No unusual exposure hx or h/o childhood pna/ asthma or knowledge of premature birth.  Sleeping  On NIV/ 2lpm flat   without nocturnal  or early am exacerbation  of respiratory  c/o's or need for noct saba. Also denies any obvious fluctuation of symptoms with weather or environmental  changes or other aggravating or alleviating factors except as outlined above   Current Allergies, Complete Past Medical History, Past Surgical History, Family History, and Social History were reviewed in [employer] record.  ROS  The following are not active complaints unless bolded Hoarseness, sore throat, dysphagia, dental problems, itching, sneezing,  nasal congestion or discharge of excess mucus or purulent secretions, ear ache,   fever, chills, sweats, unintended wt loss or wt gain, classically pleuritic or exertional cp,  orthopnea pnd or arm/hand swelling  or leg swelling, presyncope, palpitations, abdominal pain, anorexia, nausea, vomiting, diarrhea  or change in bowel habits or change in bladder habits, change in stools or change in urine, dysuria, hematuria,  rash, arthralgias, visual complaints, headache, numbness, weakness or ataxia or problems with walking or coordination,  change in mood or  memory.        Current Meds  Medication Sig  . ALPRAZolam (XANAX) 0.25 MG tablet Take half (1/2) tablet by mouth three (3) times daily.  Marland Kitchen aspirin 81 MG tablet Take 81 mg by mouth daily.  Marland Kitchen atorvastatin (LIPITOR) 20 MG tablet TAKE 1 TABLET BY MOUTH EVERY DAY  . Cholecalciferol (VITAMIN D3) 2000 units TABS Take 2,000 Units by mouth daily.  Marland Kitchen  clobetasol cream (TEMOVATE) 1.61 % Apply 1 application topically as directed.  . Cyanocobalamin (VITAMIN B 12 PO) Take 1 tablet daily by mouth.   . doxycycline (VIBRA-TABS) 100 MG tablet Take 1 tablet (100 mg total) by mouth 2 (two) times daily.  . finasteride (PROSCAR) 5 MG tablet Take 5 mg by mouth every morning.  . furosemide (LASIX) 20 MG tablet TAKE 1 TABLET BY MOUTH DAILY. CAN TAKE ADDITIONAL PILL IF NEEDED FOR SWELLING  . guaiFENesin (MUCINEX) 600 MG 12 hr tablet Take 600 mg by mouth 2 (two) times daily as needed for cough.   . hydrALAZINE (APRESOLINE) 25 MG tablet Take 25 mg by mouth every 6 (six) hours as needed.  .  levalbuterol (XOPENEX HFA) 45 MCG/ACT inhaler INHALE 2 PUFFS BY MOUTH EVERY 4 HOURS AS NEEDED FOR WHEEZING OR SHORTNESS OF BREATH  . levalbuterol (XOPENEX) 0.63 MG/3ML nebulizer solution USE ONE VIAL VIA NEBULIZER EVERY 4 HOURS AS NEEDED FOR WHEEZING OR SHORTNESS OF BREATH  . metoprolol succinate (TOPROL-XL) 25 MG 24 hr tablet Take 0.5 tablets (12.5 mg total) by mouth daily.  . mometasone (NASONEX) 50 MCG/ACT nasal spray SHAKE LIQUID AND USE 2 SPRAYS IN EACH NOSTRIL DAILY  . Multiple Vitamins-Minerals (PRESERVISION AREDS 2) CAPS Take 1 capsule by mouth daily.  . OXYGEN Inhale 2 L continuous into the lungs.   . pantoprazole (PROTONIX) 40 MG tablet Take 40 mg by mouth daily.  . predniSONE (DELTASONE) 10 MG tablet Take 1-2 tablets (10-20 mg total) by mouth daily with breakfast.  . ranitidine (ZANTAC) 150 MG tablet Take 150 mg by mouth 2 (two) times daily.  . sertraline (ZOLOFT) 50 MG tablet Take 25 mg by mouth daily.  Marland Kitchen SPIRIVA RESPIMAT 2.5 MCG/ACT AERS Inhale 2 puffs into the lungs daily.  Marland Kitchen spironolactone (ALDACTONE) 50 MG tablet Take 1 tablet (50 mg total) by mouth daily.  . SYMBICORT 160-4.5 MCG/ACT inhaler INHALE 2 PUFFS INTO THE LUNGS EVERY MORNING AND ANOTHER 2 PUFFS ABOUT 12 HOURS LATER                      Objective:   Physical Exam    wt 159 04/28/2010  > 10/14/2010 154  > 10/04/2011 150> 12/21/2011  150 > 149 01/29/2012 >154 04/05/2012 > 05/22/2012 148 > 09/30/2012  146 >>144> 10/15/2012 > 12/18/2012  145 > 148 02/14/2013 >148 03/14/2013 > 04/25/2013 148 > 06/19/2013 150 >>07/17/2013 > 08/27/2013 148 > 10/23/2013  148 >153 12/09/2013 > 02/26/2014  153 > 06/01/2014  152 > 157 06/15/2014 > 09/21/2014 146 > 10/19/2014 151 >>148 11/19/14 > 02/24/2015 148 > 06/30/2015    140 >  10/04/2015  136 > 12/20/2015   142 > 06/13/2016  132 > 09/13/2016   133 >  11/07/2016  139 > 01/11/2017   135 > 02/20/2017  135 >  03/22/2017  132 > 05/25/2017 134  > 06/27/2017  135   W/c bound/ nad at rest   .Vital signs reviewed - Note on  arrival 02 sats  97% on 2lpm POC      HEENT: nl dentition / oropharynx. Nl external ear canals without cough reflex - moderate bilateral non-specific turbinate edema     NECK :  without JVD/Nodes/TM/ nl carotid upstrokes bilaterally   LUNGS: no acc muscle use,  Mod barrel  contour chest wall with bilateral  Distant bs s audible wheeze and  without cough on insp or exp maneuver and mod   Hyperresonant  to  percussion bilaterally  CV:  RRR  no s3 or murmur or increase in P2, and 1-2+ pitting both LE's   ABD:  soft and nontender with pos mid insp Hoover's  in the supine position. No bruits or organomegaly appreciated, bowel sounds nl  MS:    ext warm without deformities, calf tenderness, cyanosis or clubbing No obvious joint restrictions   SKIN: warm and dry without lesions viz    NEURO:  alert, approp, nl sensorium with  no motor or cerebellar deficits apparent.

## 2017-06-27 NOTE — Patient Outreach (Signed)
[facility]) Care Management  06/27/2017  DENNIS HEGEMAN Oct 02, 1929 [id number]  Patient's and his wife call with concern over a medication reaction. Was started on Doxycycline and increased prednisone from 10 mg to 20 mg daily on 6/13 and started on sertraline 6/18. Patient reports "ruddy face", brown colored skin, malaise, "feels like I'm glowing all over" with burning sensation on surface of skin from shoulders to ankles. Breathing is a little worse than usual but doesn't feel like throat is closing. Reports weakness, denies muscle rigidity. Denies hives or itching, fevers, sweating, or diarrhea. Denies recent sun exposure. Sx started Monday and have amplified since starting sertraline. Takes BP in the home with me on the phone 161/89 mmHg, HR 85.  Has appointment with Dr. [name] (pulm) this afternoon. Advised patient to ask Dr. Harrington Challenger for advice on level of care this will require as I do not feel comfortable. 3-way called Dr. Alan Ripper office and left message with male at the office. She states that she will route a note to Dr. Harrington Challenger but this usually takes 24 hours before responding. Asked her to escalate the process as this is an acute reaction.   Advised patient to go to ED if breathing or other sx worsen. Otherwise, asked him to f/u with Dr. [name] as planned today. Advised him to hold sertraline until Dr. Harrington Challenger returned call.  [name], Pharm.D., BCPS PGY2 [facility] Pharmacy Resident Phone: [phone]

## 2017-06-28 DIAGNOSIS — J9611 Chronic respiratory failure with hypoxia: Secondary | ICD-10-CM | POA: Diagnosis not present

## 2017-06-28 DIAGNOSIS — I110 Hypertensive heart disease with heart failure: Secondary | ICD-10-CM | POA: Diagnosis not present

## 2017-06-28 DIAGNOSIS — I25110 Atherosclerotic heart disease of native coronary artery with unstable angina pectoris: Secondary | ICD-10-CM | POA: Diagnosis not present

## 2017-06-28 DIAGNOSIS — I739 Peripheral vascular disease, unspecified: Secondary | ICD-10-CM | POA: Diagnosis not present

## 2017-06-28 DIAGNOSIS — J441 Chronic obstructive pulmonary disease with (acute) exacerbation: Secondary | ICD-10-CM | POA: Diagnosis not present

## 2017-06-28 DIAGNOSIS — I5042 Chronic combined systolic (congestive) and diastolic (congestive) heart failure: Secondary | ICD-10-CM | POA: Diagnosis not present

## 2017-06-30 DIAGNOSIS — J441 Chronic obstructive pulmonary disease with (acute) exacerbation: Secondary | ICD-10-CM | POA: Diagnosis not present

## 2017-06-30 DIAGNOSIS — I5042 Chronic combined systolic (congestive) and diastolic (congestive) heart failure: Secondary | ICD-10-CM | POA: Diagnosis not present

## 2017-06-30 DIAGNOSIS — I110 Hypertensive heart disease with heart failure: Secondary | ICD-10-CM | POA: Diagnosis not present

## 2017-06-30 DIAGNOSIS — J9611 Chronic respiratory failure with hypoxia: Secondary | ICD-10-CM | POA: Diagnosis not present

## 2017-06-30 DIAGNOSIS — I25110 Atherosclerotic heart disease of native coronary artery with unstable angina pectoris: Secondary | ICD-10-CM | POA: Diagnosis not present

## 2017-06-30 DIAGNOSIS — I739 Peripheral vascular disease, unspecified: Secondary | ICD-10-CM | POA: Diagnosis not present

## 2017-07-01 ENCOUNTER — Encounter: Payer: Self-pay | Admitting: Internal Medicine

## 2017-07-01 NOTE — Assessment & Plan Note (Signed)
-   Start noct 02 at 2lpm 2008 and on 03/04/09 desat @ > 185 ft so rec wear with activtiy > rm to rm  - 02/15/2011   Walked RA x one lap @ 185 stopped due to  desat > corrected on 2lpm - 02/26/2014  Walked 2lpm  2 laps @ 185 ft each stopped due to  Sob/ sats 88% at nl pace  - 10/19/2014   Walked RA  2 laps @ 185 ft each stopped due to  End of study, slow pace,  sob  But no  desat   05/21/15 Eval by Dr Dedio/ rec trial of NIV  - HCO3  10/27/15 = 34  - HC03  10/06/16  = 28 ? On noct vent (non compliant) - As of admit  02/04/17 back on noct vent/ 02 2lpm 24/  Seems better when on NIV vs off but really hard to tell at this point  Will refer for Home pulmonary rehab

## 2017-07-01 NOTE — Progress Notes (Signed)
Cardiology Office Note    Date:  07/02/2017   ID:  [name], DOB 17-Nov-1929, MRN [id number]  PCP:  [name], MD  Cardiologist: Sinclair Grooms, MD   Chief Complaint  Patient presents with  . Coronary Artery Disease  . Shortness of Breath    COPD    History of Present Illness:  [name] is a 84 y.o. male who presents for CAD with prior coronary bypass grafting, COPD with cor pulmonale, hyperlipidemia, hypertension, PAD, and history of carotid obstructive disease .  Chronic diastolic heart failure with EF with EF 55 to 65% by most recent echo (HFp EF).  The patient is doing poorly.  He has spells of weakness.  He is worried that his heart has deteriorated.  This was identified on the last hospital stay.  Vague chest discomfort.  Does not use nitroglycerin for it.  Blood pressure varies significantly.  He has spells of found weakness.  Activities as mild as combing his hair can cause weakness to occur.  He queries why this occurs.  He is concerned about swelling in the lower extremities.   Past Medical History:  Diagnosis Date  . Arthritis    "generalized" (04/04/2017)  . Asthma   . Basal cell carcinoma    "left ear"  . BPH (benign prostatic hyperplasia)    severe; s/p multiple biopsies  . CAD (coronary artery disease)   . Carotid artery occlusion   . Chronic respiratory failure ([place])   . Chronic rhinitis   . COPD (chronic obstructive pulmonary disease) (HCC)    2L [place] O2  . Diastolic heart failure ([place]) 2019  . Elevated troponin 03/09/2017  . GERD (gastroesophageal reflux disease)   . History of blood transfusion    "w/his CABG" (04/04/2017)  . History of kidney stones   . Hyperlipidemia   . Hypertension   . On home oxygen therapy    "~ 24/7" (04/04/2017)  . Peripheral vascular disease ([place])   . Pneumonia 2019  . Syncope and collapse   . Ureteral tumor 08/2015   had endoscopic procedure for evaluation, unable to reach for biopsy    Past Surgical  History:  Procedure Laterality Date  . BASAL CELL CARCINOMA EXCISION Left    ear  . CARDIAC CATHETERIZATION     "prior to bypass"  . CATARACT EXTRACTION W/ INTRAOCULAR LENS  IMPLANT, BILATERAL Bilateral   . CORONARY ARTERY BYPASS GRAFT  10/2001   LIMA to LAD, SVG to OM1-2, SVG to RCA and PDA  . LITHOTRIPSY    . PROSTATE BIOPSY  Oct. 2014  . TONSILLECTOMY      Current Medications: Outpatient Medications Prior to Visit  Medication Sig Dispense Refill  . ALPRAZolam (XANAX) 0.25 MG tablet Take half (1/2) tablet by mouth three (3) times daily.    Marland Kitchen aspirin 81 MG tablet Take 81 mg by mouth daily.    Marland Kitchen atorvastatin (LIPITOR) 20 MG tablet TAKE 1 TABLET BY MOUTH EVERY DAY 90 tablet 2  . Cholecalciferol (VITAMIN D3) 2000 units TABS Take 2,000 Units by mouth daily.    . clobetasol cream (TEMOVATE) 4.16 % Apply 1 application topically as directed.    . Cyanocobalamin (VITAMIN B 12 PO) Take 1 tablet daily by mouth.     . finasteride (PROSCAR) 5 MG tablet Take 5 mg by mouth every morning.    . furosemide (LASIX) 20 MG tablet TAKE 1 TABLET BY MOUTH DAILY. CAN TAKE ADDITIONAL PILL IF NEEDED  FOR SWELLING 45 tablet 2  . guaiFENesin (MUCINEX) 600 MG 12 hr tablet Take 600 mg by mouth 2 (two) times daily as needed for cough.     . levalbuterol (XOPENEX HFA) 45 MCG/ACT inhaler INHALE 2 PUFFS BY MOUTH EVERY 4 HOURS AS NEEDED FOR WHEEZING OR SHORTNESS OF BREATH 15 g 3  . levalbuterol (XOPENEX) 0.63 MG/3ML nebulizer solution USE ONE VIAL VIA NEBULIZER EVERY 4 HOURS AS NEEDED FOR WHEEZING OR SHORTNESS OF BREATH 300 mL 3  . metoprolol succinate (TOPROL-XL) 25 MG 24 hr tablet Take 0.5 tablets (12.5 mg total) by mouth daily. 45 tablet 3  . mometasone (NASONEX) 50 MCG/ACT nasal spray SHAKE LIQUID AND USE 2 SPRAYS IN EACH NOSTRIL DAILY 17 g 11  . Multiple Vitamins-Minerals (PRESERVISION AREDS 2) CAPS Take 1 capsule by mouth daily.    . OXYGEN Inhale 2 L continuous into the lungs.     . pantoprazole (PROTONIX) 40 MG  tablet Take 40 mg by mouth daily.    . predniSONE (DELTASONE) 10 MG tablet Take 1-2 tablets (10-20 mg total) by mouth daily with breakfast. 60 tablet 2  . ranitidine (ZANTAC) 150 MG tablet Take 150 mg by mouth 2 (two) times daily.  0  . sertraline (ZOLOFT) 50 MG tablet Take 25 mg by mouth daily.    Marland Kitchen SPIRIVA RESPIMAT 2.5 MCG/ACT AERS Inhale 2 puffs into the lungs daily.  11  . SYMBICORT 160-4.5 MCG/ACT inhaler INHALE 2 PUFFS INTO THE LUNGS EVERY MORNING AND ANOTHER 2 PUFFS ABOUT 12 HOURS LATER 10.2 g 11  . spironolactone (ALDACTONE) 50 MG tablet Take 1 tablet (50 mg total) by mouth daily. 30 tablet 11  . hydrALAZINE (APRESOLINE) 25 MG tablet Take 25 mg by mouth every 6 (six) hours as needed.    . doxycycline (VIBRA-TABS) 100 MG tablet Take 1 tablet (100 mg total) by mouth 2 (two) times daily. (Patient not taking: Reported on 07/02/2017) 14 tablet 0   No facility-administered medications prior to visit.      Allergies:   Cefdinir; Nitrofurantoin; Sulfa antibiotics; Sulfonamide derivatives; Cefdinir; Ciprofloxacin; Nitrofurantoin; Ciprofloxacin; Levaquin [levofloxacin]; and Levaquin [levofloxacin]   Social History   Socioeconomic History  . Marital status: Married    Spouse name: Not on file  . Number of children: 2  . Years of education: Not on file  . Highest education level: Not on file  Occupational History  . 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 Financial resource strain: Not on file  . Food insecurity:    Worry: Not on file    Inability: Not on file  . Transportation needs:    Medical: Not on file    Non-medical: Not on file  Tobacco Use  . Smoking status: Former Smoker    Packs/day: 2.00    Years: 33.00    Pack years: 66.00    Types: Cigarettes    Last attempt to quit: 03/25/1982    Years since quitting: 35.2  . Smokeless tobacco: Never Used  Substance and Sexual Activity  . Alcohol use: Yes    Alcohol/week: 0.0 oz     Comment: 1.5-3 oz daily in the past, minimal use in the last few months  . Drug use: No  . Sexual activity: Not on file  Lifestyle  . Physical activity:    Days per week: Not on file    Minutes per session: Not on file  . Stress: Not on file  Relationships  .  Social connections:    Talks on phone: Not on file    Gets together: Not on file    Attends religious service: Not on file    Active member of club or organization: Not on file    Attends meetings of clubs or organizations: Not on file    Relationship status: Not on file  Other Topics Concern  . Not on file  Social History Narrative   ** Merged History Encounter **       Pt lives at home with his spouse.           Family History:  The patient's family history includes Allergies in his brother; Breast cancer in his mother; Cancer in his mother; Heart attack in his father; Heart disease in his brother, brother, father, and mother; Hyperlipidemia in his brother; Hypertension in his brother and mother; Other in his mother; Peripheral vascular disease in his brother; Stroke in his brother.   ROS:   Please see the history of present illness.    Cough, weakness, arm heaviness, vague chest discomfort, muscle pain, back pain, anxiety, irregular heartbeat and skipping.  Decreased hearing, vision disturbance, depression, back discomfort, easy bruising. All other systems reviewed and are negative.   PHYSICAL EXAM:   VS:  BP 116/68   Pulse 95   Ht _0  (1.702 m)   Wt 132 lb 6.4 oz (60.1 kg)   BMI 20.74 kg/m    GEN: Well nourished, well developed, in no acute distress  HEENT: normal  Neck: no JVD, carotid bruits, or masses Cardiac: Irregular RR; no murmurs, rubs, or gallops.  3+ bilateral ankle edema. Respiratory:  clear to auscultation bilaterally, normal work of breathing GI: soft, nontender, nondistended, + BS MS: no deformity or atrophy  Skin: warm and dry, no rash Neuro:  Alert and Oriented x 3, Strength and sensation  are intact Psych: euthymic mood, full affect  Wt Readings from Last 3 Encounters:  07/02/17 132 lb 6.4 oz (60.1 kg)  06/27/17 135 lb (61.2 kg)  06/21/17 133 lb (60.3 kg)      Studies/Labs Reviewed:   EKG:  EKG not repeated.  Recent Labs: 03/08/2017: TSH 1.851 03/21/2017: NT-Pro BNP 1,372 03/24/2017: B Natriuretic Peptide 190.3 04/04/2017: ALT 20 04/05/2017: Hemoglobin 11.5; Platelets 193 06/01/2017: BUN 19; Creatinine, Ser 0.99; Magnesium 2.4; Potassium 3.7; Sodium 139   Lipid Panel    Component Value Date/Time   CHOL 146 03/26/2017 0621   TRIG 34 03/26/2017 0621   HDL 79 03/26/2017 0621   CHOLHDL 1.8 03/26/2017 0621   VLDL 7 03/26/2017 0621   LDLCALC 60 03/26/2017 0621    Additional studies/ records that were reviewed today include:  2D Doppler echocardiogram March 2019:  Study Conclusions   - Left ventricle: The cavity size was normal. Systolic function was   normal. The estimated ejection fraction was in the range of 60%   to 65%. Wall motion was normal; there were no regional wall   motion abnormalities. Doppler parameters are consistent with   abnormal left ventricular relaxation (grade 1 diastolic   dysfunction). - Aortic valve: Transvalvular velocity was within the normal range.   There was no stenosis. There was no regurgitation. - Mitral valve: Transvalvular velocity was within the normal range.   There was no evidence for stenosis. There was trivial   regurgitation. - Right ventricle: The cavity size was normal. Wall thickness was   normal. Systolic function was reduced. - Tricuspid valve: There was mild regurgitation. - Pulmonary  arteries: Systolic pressure was within the normal   range. PA peak pressure: 24 mm Hg (S). - Pericardium, extracardiac: A trivial pericardial effusion was   identified.   Continuous ambulatory monitor June 2019:  Sinus rhythm and sinus tachycardia  PACs and PVCs  No atrial fibrillation or sustained tachycardia      ASSESSMENT:    1. Chest pain, unspecified type   2. Peripheral vascular disease ([place])   3. Obstructive sleep apnea   4. Other hyperlipidemia   5. Essential hypertension   6. Coronary artery disease involving coronary bypass graft of native heart with angina pectoris ([place])   7. Cor pulmonale, chronic (HCC)/ clinical dx    8. Chronic diastolic CHF (congestive heart failure) (HCC)      PLAN:  In order of problems listed above:  1. Uncertain etiology, probably multifactorial. 2. Stable without rest pain. 3. Not addressed 4. Being treated with most recent LDL of 60 5. Excellent control 6. Nitroglycerin can be used if episodes of chest discomfort are concerning. 7. Aldactone 12.5 mg/day added to his current medical regimen.  Can titrate up to 25 or 50 mg as tolerated.  Was initially given a prescription for 50 mg of Aldactone by Dr. Shyrl Numbers but I believe his blood pressure would not tolerate that dose.  Be met in 1 week.  Overall chronically ill.  No simple explanation for any of his complaints.  COPD with chronic lung failure is his major comorbidity which is producing cor pulmonale and low output syndrome.  We will try to mobilize edema fluid to help "dry out" lungs with low-dose Aldactone added to furosemide.  Blood pressure will need to be monitored closely.  Laboratory work in 1 week after starting therapy.  Prolonged office visit with greater than 50% of the time spent in counseling related to coordination of care and understanding of disease process.  Medication Adjustments/Labs and Tests Ordered: Current medicines are reviewed at length with the patient today.  Concerns regarding medicines are outlined above.  Medication changes, Labs and Tests ordered today are listed in the Patient Instructions below. Patient Instructions  Medication Instructions:  1) DECREASE Spironolactone to 12.10m once daily  Labwork: Your physician recommends that you return for lab work in: 1 week  (BMET)   Testing/Procedures: None  Follow-Up: Your physician recommends that you schedule a follow-up appointment in: 6-8 weeks with a PA or NP.     Any Other Special Instructions Will Be Listed Below (If Applicable).  Make sure you are weighing daily under the same circumstances.    If you need a refill on your cardiac medications before your next appointment, please call your pharmacy.      Signed, HSinclair Grooms MD  07/02/2017 6:13 PM    [facility] [address]  [id number]: [phone] Fax: [phone]

## 2017-07-01 NOTE — Assessment & Plan Note (Signed)
rx reviewed - has extra lasix to take prn per med calendar as part of action plan

## 2017-07-01 NOTE — Assessment & Plan Note (Signed)
-   PFTs  05/04/05 FEV1 36% ratio 28% with 29% improvement after bronchodilators DLCO 48%  - PFT's 03/26/08 30% ratio 34 with 14% improvement after bronchodilators DLC0 38 %  - Referred to rehab 12/19/2012 > completed March 2015  - changed to spiriva respimat 02/14/2013  - started daily prednisone 06/19/13 > improved only a little> tapered off mid July 2015 > ok to change to prn prednisone 08/27/2013  - flared off nasonex and gerd rx 09/21/2014 > resumed - 10/19/2014  p extensive coaching HFA effectiveness =    90%  - changed pred to ceiling of 20 / floor of 5 mg daily as of 10/19/2014 > changed to floor of 10 mg daily 10/04/2015  - PFT's  06/08/2015  FEV1 0.58 (22 % ) ratio 27  p No sign % improvement from saba p symb/spiriva prior to study with DLCO  9/9 % corrects to 27 % for alv volume   - changed pred to ceiling of 20 and floor of 5 mg daily 06/13/16 > increased floor to 10 mg daily effective 02/20/2017  - referred back to rehab 09/13/2016  - try off noct vent and start titrating up xanax 01/11/2017 > no change but then admitted with flu and placed back on vent  - alpha one AT 03/01/17  MM    - 05/25/2017  After extensive coaching inhaler device  effectiveness =  75% (short Ti)   Improved on higher dose prednisone - The goal with a chronic steroid dependent illness is always arriving at the lowest effective dose that controls the disease/symptoms and not accepting a set "formula" which is based on statistics or guidelines that don't always take into account patient  variability or the natural hx of the dz in every individual patient, which may well vary over time.  For now therefore I recommend the patient maintain  20 mg per day until better then 10 mg daily    I had an extended discussion with the patient reviewing all relevant studies completed to date and  lasting 15 to 20 minutes of a 25 minute visit    Each maintenance medication was reviewed in detail including most importantly the difference between  maintenance and prns and under what circumstances the prns are to be triggered using an action plan format that is not reflected in the computer generated alphabetically organized AVS but trather by a customized med calendar that reflects the AVS meds with confirmed 100% correlation.   In addition, Please see AVS for unique instructions that I personally wrote and verbalized to the the pt in detail and then reviewed with pt  by my nurse highlighting any  changes in therapy recommended at today's visit to their plan of care.

## 2017-07-02 ENCOUNTER — Ambulatory Visit (INDEPENDENT_AMBULATORY_CARE_PROVIDER_SITE_OTHER): Payer: Medicare Other | Admitting: Interventional Cardiology

## 2017-07-02 ENCOUNTER — Encounter: Payer: Self-pay | Admitting: Interventional Cardiology

## 2017-07-02 DIAGNOSIS — I25709 Atherosclerosis of coronary artery bypass graft(s), unspecified, with unspecified angina pectoris: Secondary | ICD-10-CM | POA: Diagnosis not present

## 2017-07-02 DIAGNOSIS — I5032 Chronic diastolic (congestive) heart failure: Secondary | ICD-10-CM

## 2017-07-02 DIAGNOSIS — I739 Peripheral vascular disease, unspecified: Secondary | ICD-10-CM | POA: Diagnosis not present

## 2017-07-02 DIAGNOSIS — E7849 Other hyperlipidemia: Secondary | ICD-10-CM | POA: Diagnosis not present

## 2017-07-02 DIAGNOSIS — G4733 Obstructive sleep apnea (adult) (pediatric): Secondary | ICD-10-CM

## 2017-07-02 DIAGNOSIS — I10 Essential (primary) hypertension: Secondary | ICD-10-CM | POA: Diagnosis not present

## 2017-07-02 DIAGNOSIS — I200 Unstable angina: Secondary | ICD-10-CM | POA: Diagnosis not present

## 2017-07-02 DIAGNOSIS — R079 Chest pain, unspecified: Secondary | ICD-10-CM | POA: Diagnosis not present

## 2017-07-02 DIAGNOSIS — I2781 Cor pulmonale (chronic): Secondary | ICD-10-CM | POA: Diagnosis not present

## 2017-07-02 MED ORDER — SPIRONOLACTONE 25 MG PO TABS: 12.5000 mg | ORAL_TABLET | Freq: Every day | ORAL | 3 refills | Status: DC

## 2017-07-02 NOTE — Patient Instructions (Signed)
Medication Instructions:  1) DECREASE Spironolactone to 12.5mg  once daily  Labwork: Your physician recommends that you return for lab work in: 1 week (BMET)   Testing/Procedures: None  Follow-Up: Your physician recommends that you schedule a follow-up appointment in: 6-8 weeks with a PA or NP.     Any Other Special Instructions Will Be Listed Below (If Applicable).  Make sure you are weighing daily under the same circumstances.    If you need a refill on your cardiac medications before your next appointment, please call your pharmacy.

## 2017-07-03 ENCOUNTER — Telehealth: Payer: Self-pay | Admitting: Licensed Clinical Social Worker

## 2017-07-03 NOTE — Telephone Encounter (Signed)
Palliative Care SW phoned patient and scheduled a home visit for Thursday, 07/05/17, at 1:30pm.

## 2017-07-04 ENCOUNTER — Ambulatory Visit (INDEPENDENT_AMBULATORY_CARE_PROVIDER_SITE_OTHER): Payer: Medicare Other | Admitting: Vascular Surgery

## 2017-07-04 ENCOUNTER — Telehealth: Payer: Self-pay | Admitting: Internal Medicine

## 2017-07-04 ENCOUNTER — Ambulatory Visit (HOSPITAL_COMMUNITY)
Admission: RE | Admit: 2017-07-04 | Discharge: 2017-07-04 | Disposition: A | Discharge status: Home / Self Care | Payer: Medicare Other | Source: Ambulatory Visit | Attending: Vascular Surgery | Admitting: Vascular Surgery

## 2017-07-04 ENCOUNTER — Encounter: Payer: Self-pay | Admitting: Vascular Surgery

## 2017-07-04 ENCOUNTER — Other Ambulatory Visit: Payer: Self-pay

## 2017-07-04 VITALS — BP 123/68 | HR 83 | Temp 97.3°F | Resp 16 | Ht 69.0 in | Wt 127.6 lb

## 2017-07-04 DIAGNOSIS — I200 Unstable angina: Secondary | ICD-10-CM | POA: Diagnosis not present

## 2017-07-04 DIAGNOSIS — I739 Peripheral vascular disease, unspecified: Secondary | ICD-10-CM

## 2017-07-04 NOTE — Progress Notes (Signed)
Patient name: [name] MRN: [id number] DOB: 1929/09/30 Sex: male  REASON FOR VISIT:   Follow-up of peripheral vascular disease and carotid disease.  HPI:   [name] is a pleasant 84 y.o. male who was seen by our nurse practitioner on 07/03/2016.  The patient was seen with multiple vascular issues and was then apparently set up to see me for a 46-month follow-up visit.  I have not seen him in many years.  At the time of his last visit, he had a carotid duplex scan which showed a 40 to 59% right carotid stenosis with no significant stenosis on the left.  A mesenteric duplex at that time showed no evidence of celiac or SMA disease.  Lower extremity arterial Doppler studies showed an ABI of 85% however this was calcified and likely not reliable.  There are monophasic Doppler signals in the right foot.  On the left side ABI was 63% with monophasic Doppler signals in the left foot.  With respect to his peripheral vascular disease, the patient's activity is very limited.  He is now on O2 at home.  He was hospitalized in January with the flu and since this time his activity has been significantly diminished.  He had been walking on the treadmill for 10 to 15 minutes a day.  Thus I do not get any history of claudication.  He denies any history of rest pain.  He has mild bilateral lower extremity swelling.  He denies any history of nonhealing ulcers.  With respect to his mild carotid disease he denies any history of stroke, TIAs, expressive or receptive aphasia, or amaurosis fugax.  He does admit to some occasional dizziness.  He does describe some occasional postprandial abdominal pain.  I believe this is why he previously had a duplex scan which showed no significant mesenteric artery occlusive disease.  His pain does not always occur after eating.  He has gone from 150 pounds 227 pounds over the last year.  Past Medical History:  Diagnosis Date  . Arthritis    "generalized" (04/04/2017)  .  Asthma   . Basal cell carcinoma    "left ear"  . BPH (benign prostatic hyperplasia)    severe; s/p multiple biopsies  . CAD (coronary artery disease)   . Carotid artery occlusion   . Chronic respiratory failure ([place])   . Chronic rhinitis   . COPD (chronic obstructive pulmonary disease) (H[facility])    2L [place] O2  . Diastolic heart failure ([place]) 2019  . Elevated troponin 03/09/2017  . GERD (gastroesophageal reflux disease)   . History of blood transfusion    "w/his CABG" (04/04/2017)  . History of kidney stones   . Hyperlipidemia   . Hypertension   . On home oxygen therapy    "~ 24/7" (04/04/2017)  . Peripheral vascular disease ([place])   . Pneumonia 2019  . Syncope and collapse   . Ureteral tumor 08/2015   had endoscopic procedure for evaluation, unable to reach for biopsy    Family History  Problem Relation Age of Onset  . Heart disease Mother   . Heart disease Father        Before age 66  . Breast cancer Mother   . Cancer Mother        Breast cancer  . Hypertension Mother   . Other Mother        AAA  and   Amputation  . Heart attack Father   . Stroke Brother  x3, still living   . Peripheral vascular disease Brother   . Heart disease Brother   . Hyperlipidemia Brother   . Hypertension Brother   . Heart disease Brother   . Allergies Brother     SOCIAL HISTORY: He is not a smoker. Social History   Tobacco Use  . Smoking status: Former Smoker    Packs/day: 2.00    Years: 33.00    Pack years: 66.00    Types: Cigarettes    Last attempt to quit: 03/25/1982    Years since quitting: 35.3  . Smokeless tobacco: Never Used  Substance Use Topics  . Alcohol use: Yes    Alcohol/week: 0.0 oz    Comment: 1.5-3 oz daily in the past, minimal use in the last few months    Allergies  Allergen Reactions  . Cefdinir Other (See Comments)    Severe Diarrhea  . Nitrofurantoin Swelling    Hand Swelling  . Sulfa Antibiotics Anaphylaxis and Swelling  . Sulfonamide Derivatives  Swelling    REACTION: facial/tongue swelling  . Cefdinir Diarrhea    REACTION: diarrhea  . Ciprofloxacin Itching and Rash    Red itchy hands  . Nitrofurantoin Swelling    Swollen hands  . Ciprofloxacin Itching and Rash  . Levaquin [Levofloxacin] Itching and Rash  . Levaquin [Levofloxacin] Itching and Rash    Current Outpatient Medications  Medication Sig Dispense Refill  . ALPRAZolam (XANAX) 0.25 MG tablet Take half (1/2) tablet by mouth three (3) times daily.    Marland Kitchen aspirin 81 MG tablet Take 81 mg by mouth daily.    Marland Kitchen atorvastatin (LIPITOR) 20 MG tablet TAKE 1 TABLET BY MOUTH EVERY DAY 90 tablet 2  . Cholecalciferol (VITAMIN D3) 2000 units TABS Take 2,000 Units by mouth daily.    . clobetasol cream (TEMOVATE) 2.69 % Apply 1 application topically as directed.    . finasteride (PROSCAR) 5 MG tablet Take 5 mg by mouth every morning.    . furosemide (LASIX) 20 MG tablet TAKE 1 TABLET BY MOUTH DAILY. CAN TAKE ADDITIONAL PILL IF NEEDED FOR SWELLING 45 tablet 2  . levalbuterol (XOPENEX HFA) 45 MCG/ACT inhaler INHALE 2 PUFFS BY MOUTH EVERY 4 HOURS AS NEEDED FOR WHEEZING OR SHORTNESS OF BREATH 15 g 3  . levalbuterol (XOPENEX) 0.63 MG/3ML nebulizer solution USE ONE VIAL VIA NEBULIZER EVERY 4 HOURS AS NEEDED FOR WHEEZING OR SHORTNESS OF BREATH 300 mL 3  . metoprolol succinate (TOPROL-XL) 25 MG 24 hr tablet Take 0.5 tablets (12.5 mg total) by mouth daily. 45 tablet 3  . mometasone (NASONEX) 50 MCG/ACT nasal spray SHAKE LIQUID AND USE 2 SPRAYS IN EACH NOSTRIL DAILY 17 g 11  . Multiple Vitamins-Minerals (PRESERVISION AREDS 2) CAPS Take 1 capsule by mouth daily.    . OXYGEN Inhale 2 L continuous into the lungs.     . pantoprazole (PROTONIX) 40 MG tablet Take 40 mg by mouth daily.    . predniSONE (DELTASONE) 10 MG tablet Take 1-2 tablets (10-20 mg total) by mouth daily with breakfast. 60 tablet 2  . ranitidine (ZANTAC) 150 MG tablet Take 150 mg by mouth 2 (two) times daily.  0  . sertraline (ZOLOFT) 50  MG tablet Take 25 mg by mouth daily.    Marland Kitchen SPIRIVA RESPIMAT 2.5 MCG/ACT AERS Inhale 2 puffs into the lungs daily.  11  . spironolactone (ALDACTONE) 25 MG tablet Take 0.5 tablets (12.5 mg total) by mouth daily. (Patient taking differently: Take 0.125 mg by mouth daily. )  45 tablet 3  . SYMBICORT 160-4.5 MCG/ACT inhaler INHALE 2 PUFFS INTO THE LUNGS EVERY MORNING AND ANOTHER 2 PUFFS ABOUT 12 HOURS LATER 10.2 g 11  . Cyanocobalamin (VITAMIN B 12 PO) Take 1 tablet daily by mouth.     Marland Kitchen guaiFENesin (MUCINEX) 600 MG 12 hr tablet Take 600 mg by mouth 2 (two) times daily as needed for cough.     . hydrALAZINE (APRESOLINE) 25 MG tablet Take 25 mg by mouth every 6 (six) hours as needed.     No current facility-administered medications for this visit.     REVIEW OF SYSTEMS:  [X]  denotes positive finding, [ ]  denotes negative finding Cardiac  Comments:  Chest pain or chest pressure: x   Shortness of breath upon exertion: x   Short of breath when lying flat:    Irregular heart rhythm: x       Vascular    Pain in calf, thigh, or hip brought on by ambulation:    Pain in feet at night that wakes you up from your sleep:     Blood clot in your veins:    Leg swelling:  x       Pulmonary    Oxygen at home: x   Productive cough:     Wheezing:         Neurologic    Sudden weakness in arms or legs:     Sudden numbness in arms or legs:     Sudden onset of difficulty speaking or slurred speech:    Temporary loss of vision in one eye:     Problems with dizziness:  x       Gastrointestinal    Blood in stool:     Vomited blood:         Genitourinary    Burning when urinating:     Blood in urine:        Psychiatric    Major depression:         Hematologic    Bleeding problems:    Problems with blood clotting too easily:        Skin    Rashes or ulcers:        Constitutional    Fever or chills:     PHYSICAL EXAM:   Vitals:   07/04/17 1608  BP: 123/68  Pulse: 83  Resp: 16  Temp: (!)  97.3 F (36.3 C)  TempSrc: Oral  SpO2: 97%  Weight: 127 lb 9.6 oz (57.9 kg)  Height: 5\' 9"  (1.753 m)    GENERAL: The patient is a well-nourished male, in no acute distress. The vital signs are documented above. CARDIAC: There is a regular rate and rhythm.  VASCULAR: I do not detect carotid bruits. He has diminished femoral pulses bilaterally. I cannot palpate pedal pulses. He has bilateral lower extremity swelling. PULMONARY: There is good air exchange bilaterally without wheezing or rales. ABDOMEN: Soft and non-tender with normal pitched bowel sounds.  MUSCULOSKELETAL: There are no major deformities or cyanosis. NEUROLOGIC: No focal weakness or paresthesias are detected. SKIN: There are no ulcers or rashes noted. PSYCHIATRIC: The patient has a normal affect.  DATA:    LOWER EXTREMITY ARTERIAL DOPPLER STUDY: I have been interpreted his lower extremity arterial Doppler study today.  On the right side, there are monophasic Doppler signals in the dorsalis pedis and posterior tibial positions.  ABI could not be obtained as the vessels are not compressible.  Toe pressure on the right is 37  mmHg.  On the left side there is a monophasic dorsalis pedis and posterior tibial signal.  ABI is 81% although this is likely falsely elevated.  Toe pressure on the left is 44 mmHg.  CAROTID DUPLEX: I did review the carotid duplex scan that was done on 02/28/2017.  This showed a less than 39% carotid stenosis bilaterally.  Both vertebral arteries were patent with antegrade flow.  MEDICAL ISSUES:   PERIPHERAL VASCULAR DISEASE: He has evidence of multilevel arterial occlusive disease.  However he has minimal symptoms as his activity is very limited related to his pulmonary issues.  He is on oxygen at home now.  His activity is very limited.  I have encouraged him to try to stay as active as possible.  Fortunately he is not a smoker.  He is on aspirin and is on a statin.  I have ordered follow-up ABIs in  March and I will see him back at that time.  He knows to call sooner if he has problems.  CAROTID DISEASE: Previous duplex scan showed a 40 to 59% right carotid stenosis.  This was in June 2018.  Subsequent study in February 2019 did not show any significant carotid disease on either side.  He has had no recent neurologic symptoms.  He does have some dizziness but is carotid duplex scan in February showed no evidence of vertebrobasilar insufficiency.  I have ordered a follow-up carotid duplex scan in March 2020 and I will see him at that time.  He is on aspirin and is on a statin.  Deitra Mayo [facility] [phone]

## 2017-07-04 NOTE — Telephone Encounter (Signed)
Spoke with Milbert Coulter with [facility]. I have given a verbal for this nursing visit. Nothing further was needed.

## 2017-07-05 ENCOUNTER — Other Ambulatory Visit: Payer: Medicare Other | Admitting: *Deleted

## 2017-07-05 ENCOUNTER — Other Ambulatory Visit: Payer: Medicare Other | Admitting: Licensed Clinical Social Worker

## 2017-07-05 DIAGNOSIS — I110 Hypertensive heart disease with heart failure: Secondary | ICD-10-CM | POA: Diagnosis not present

## 2017-07-05 DIAGNOSIS — J9611 Chronic respiratory failure with hypoxia: Secondary | ICD-10-CM | POA: Diagnosis not present

## 2017-07-05 DIAGNOSIS — Z515 Encounter for palliative care: Secondary | ICD-10-CM

## 2017-07-05 DIAGNOSIS — I5042 Chronic combined systolic (congestive) and diastolic (congestive) heart failure: Secondary | ICD-10-CM | POA: Diagnosis not present

## 2017-07-05 DIAGNOSIS — I739 Peripheral vascular disease, unspecified: Secondary | ICD-10-CM | POA: Diagnosis not present

## 2017-07-05 DIAGNOSIS — J441 Chronic obstructive pulmonary disease with (acute) exacerbation: Secondary | ICD-10-CM | POA: Diagnosis not present

## 2017-07-05 DIAGNOSIS — I25110 Atherosclerotic heart disease of native coronary artery with unstable angina pectoris: Secondary | ICD-10-CM | POA: Diagnosis not present

## 2017-07-05 NOTE — Progress Notes (Signed)
COMMUNITY PALLIATIVE CARE SW NOTE  PATIENT NAME: [name] DOB: 02-16-1929 MRN: [id number]  PRIMARY CARE PROVIDER: [name], MD  RESPONSIBLE PARTY:  Acct ID - Guarantor Home Phone Work Phone Relationship Acct Type  [id number] - [name] [phone]  Self P/F     [name], [name],  [id number]     PLAN OF CARE and INTERVENTIONS:             1. GOALS OF CARE/ ADVANCE CARE PLANNING:  Patient stated his goal is to remain in his home, but realizes that may not be possible.  He does not wish to be hospitalized.He remains a full code. 2. SOCIAL/EMOTIONAL/SPIRITUAL ASSESSMENT/ INTERVENTIONS:  SW and Palliative Care RN, [name], met with patient and his wife, [name].  Patient was sitting in the living room with his O2 on.  He denied pain, but said he is weaker.  SW explored his feelings around this and his attitude towards aging.  SW provided active listening and supportive counseling.  They said moving across the street to their daughter's home is not longer and option because their daughter feels the home is too large.  Although they would both like to remain in their home, they said the steps are a challenge.  Patient appeared to respond positively to the visit and [name] said patient is a very social person.  They welcomed visits from the team in the future. 3. PATIENT/CAREGIVER EDUCATION/ COPING:  Patient copes by expressing his feelings openly.  He takes breaks when his breathing becomes short.  Patient's wife also expresses her feelings openly. 4. PERSONAL EMERGENCY PLAN:  Patient and his wife will contact family members in times of need. 5. COMMUNITY RESOURCES COORDINATION/ HEALTH CARE NAVIGATION:  Patient will begin receiving physical therapy again beginning today. 6. FINANCIAL/LEGAL CONCERNS/INTERVENTIONS:  None per couple.     SOCIAL HX:  Social History   Tobacco Use  . Smoking status: Former Smoker    Packs/day: 2.00    Years: 33.00    Pack years:  66.00    Types: Cigarettes    Last attempt to quit: 03/25/1982    Years since quitting: 35.3  . Smokeless tobacco: Never Used  Substance Use Topics  . Alcohol use: Yes    Alcohol/week: 0.0 oz    Comment: 1.5-3 oz daily in the past, minimal use in the last few months    CODE STATUS:  Full Code  ADVANCED DIRECTIVES: N MOST FORM COMPLETE:  N HOSPICE EDUCATION PROVIDED:  Not during current visit. PPS:  Patient's appetite is normal.  He is able to stand and ambulate independently. Duration of visit and documentation:  60 minutes.      Travis Corn Mckaylin Bastien, LCSW

## 2017-07-05 NOTE — Progress Notes (Signed)
COMMUNITY PALLIATIVE CARE RN NOTE  PATIENT NAME: [name] DOB: 1929-08-17 MRN: [id number]  PRIMARY CARE PROVIDER: [name], MD  RESPONSIBLE PARTY:  Acct ID - Guarantor Home Phone Work Phone Relationship Acct Type  [id number] - Buchmann,Shooter L [phone]  Self P/F     [place], [name], [place] [address]    PLAN OF CARE and INTERVENTION:  1. ADVANCE CARE PLANNING/GOALS OF CARE: Remain at home with wife for as long as possible and avoid hospitalizations 2. PATIENT/CAREGIVER EDUCATION:  Reinforced Safety/Fall Precautions and Importance of Energy Conservation 3. DISEASE STATUS: Joint visit made with [facility]. Patient reports that he is having an "off" day. Patient feels that his overall generalized weakness is progressing, especially in his bilateral legs. Also reports a tingling sensation in both arms, which he states is not a new symptoms, but seems to occur more often. Continued dyspnea with conversation and with exertion. Remains on Oxygen at 2L/min via [place]. Has chronic issues with sinusitis and had tried several types of nasal sprays which are helpful, but effects do not last very long. Reports that it is taking longer for him to bathe and dress himself. Also ambulates at a slower pace and feels more unsteady. Often holds onto furniture. Walking up stairs also becoming more problematic. Wife says patient continues to steadily decline and is markedly different since our visit 1 month ago. Patient states that he performs his leg exercises daily but denies any improvements. Patient is restarting PT, 1st visit being today at 4 pm. States that he has started two new medications recently, Zoloft and Aldactone that he has been taking for about 3 days. Patient noticing that he is urinating more since start of Aldactone. Patient unsure if Zoloft has made any difference in his mood. Explained to patient that the effects of this medication can take up to several weeks to  optimize. Patient recognizes his overall decline and knows that in the future he will have to make a decision whether to hire caregivers to come into his home or look into a retirement community. He speaks of his frustrations regarding not being able to perform activities at the same level he used to and the only time he travels outside of his home being when he has a MD appointment.    HISTORY OF PRESENT ILLNESS:  This is a 84 yo male who resides at home with his wife. Palliative Care continues to follow patient to assess overall condition and assist with symptom management needs as necessary. Next visit scheduled in 1 week. Wife requests weekly RN visits.  CODE STATUS: FULL CODE  ADVANCED DIRECTIVES: Y MOST FORM: no PPS: 50%   PHYSICAL EXAM:   VITALS: Today's Vitals   07/05/17 1359  BP: 138/82  Pulse: 88  Resp: 18  SpO2: 97%  PainSc: 0-No pain    LUNGS: decreased breath sounds CARDIAC: Cor irreg, irreg RRR EXTREMITIES: non-pitting edema to bilateral lower extremities, hands slightly swollen SKIN: Exposed skin dry and intact, bruising noted to bilateral hands, thin/frail skin  NEURO: Alert and oriented x 3, pleasant mood and engaging, HOH, increased generalized weakness   (Duration of visit and documentation 75 minutes)    [name], RN, BSN

## 2017-07-06 ENCOUNTER — Telehealth: Payer: Self-pay | Admitting: *Deleted

## 2017-07-06 ENCOUNTER — Telehealth: Payer: Self-pay | Admitting: Internal Medicine

## 2017-07-06 ENCOUNTER — Ambulatory Visit: Payer: Medicare Other | Admitting: Internal Medicine

## 2017-07-06 DIAGNOSIS — I779 Disorder of arteries and arterioles, unspecified: Secondary | ICD-10-CM

## 2017-07-06 DIAGNOSIS — I110 Hypertensive heart disease with heart failure: Secondary | ICD-10-CM | POA: Diagnosis not present

## 2017-07-06 DIAGNOSIS — J441 Chronic obstructive pulmonary disease with (acute) exacerbation: Secondary | ICD-10-CM | POA: Diagnosis not present

## 2017-07-06 DIAGNOSIS — I5042 Chronic combined systolic (congestive) and diastolic (congestive) heart failure: Secondary | ICD-10-CM | POA: Diagnosis not present

## 2017-07-06 DIAGNOSIS — I25110 Atherosclerotic heart disease of native coronary artery with unstable angina pectoris: Secondary | ICD-10-CM | POA: Diagnosis not present

## 2017-07-06 DIAGNOSIS — J9611 Chronic respiratory failure with hypoxia: Secondary | ICD-10-CM | POA: Diagnosis not present

## 2017-07-06 DIAGNOSIS — I739 Peripheral vascular disease, unspecified: Secondary | ICD-10-CM | POA: Diagnosis not present

## 2017-07-06 MED ORDER — FUROSEMIDE 20 MG PO TABS: ORAL_TABLET | ORAL | 2 refills | Status: DC

## 2017-07-06 NOTE — Telephone Encounter (Signed)
Pt is aware that Rx has been sent to pharmacy. Nothing more needed at this time.

## 2017-07-06 NOTE — Telephone Encounter (Signed)
Patient called for a referral to podiatry, Dr. Scot Dock discussed this with him recently and he is now ready to see someone. I told him that we would refer him to [facility]. Our appt staff will arrange this referral asap.

## 2017-07-09 ENCOUNTER — Other Ambulatory Visit: Payer: Medicare Other | Admitting: *Deleted

## 2017-07-09 DIAGNOSIS — I110 Hypertensive heart disease with heart failure: Secondary | ICD-10-CM | POA: Diagnosis not present

## 2017-07-09 DIAGNOSIS — R079 Chest pain, unspecified: Secondary | ICD-10-CM | POA: Diagnosis not present

## 2017-07-09 DIAGNOSIS — I5032 Chronic diastolic (congestive) heart failure: Secondary | ICD-10-CM

## 2017-07-09 DIAGNOSIS — I10 Essential (primary) hypertension: Secondary | ICD-10-CM | POA: Diagnosis not present

## 2017-07-09 DIAGNOSIS — J441 Chronic obstructive pulmonary disease with (acute) exacerbation: Secondary | ICD-10-CM | POA: Diagnosis not present

## 2017-07-09 DIAGNOSIS — I25110 Atherosclerotic heart disease of native coronary artery with unstable angina pectoris: Secondary | ICD-10-CM | POA: Diagnosis not present

## 2017-07-09 DIAGNOSIS — I739 Peripheral vascular disease, unspecified: Secondary | ICD-10-CM | POA: Diagnosis not present

## 2017-07-09 DIAGNOSIS — I5042 Chronic combined systolic (congestive) and diastolic (congestive) heart failure: Secondary | ICD-10-CM | POA: Diagnosis not present

## 2017-07-09 DIAGNOSIS — J9611 Chronic respiratory failure with hypoxia: Secondary | ICD-10-CM | POA: Diagnosis not present

## 2017-07-10 DIAGNOSIS — H26493 Other secondary cataract, bilateral: Secondary | ICD-10-CM | POA: Diagnosis not present

## 2017-07-10 DIAGNOSIS — Z961 Presence of intraocular lens: Secondary | ICD-10-CM | POA: Diagnosis not present

## 2017-07-10 DIAGNOSIS — H353131 Nonexudative age-related macular degeneration, bilateral, early dry stage: Secondary | ICD-10-CM | POA: Diagnosis not present

## 2017-07-10 DIAGNOSIS — H35373 Puckering of macula, bilateral: Secondary | ICD-10-CM | POA: Diagnosis not present

## 2017-07-10 DIAGNOSIS — H40053 Ocular hypertension, bilateral: Secondary | ICD-10-CM | POA: Diagnosis not present

## 2017-07-10 DIAGNOSIS — H1851 Endothelial corneal dystrophy: Secondary | ICD-10-CM | POA: Diagnosis not present

## 2017-07-10 DIAGNOSIS — H17823 Peripheral opacity of cornea, bilateral: Secondary | ICD-10-CM | POA: Diagnosis not present

## 2017-07-10 LAB — BASIC METABOLIC PANEL
BUN/Creatinine Ratio: 15 (ref 10–24)
BUN: 15 mg/dL (ref 8–27)
CO2: 28 mmol/L (ref 20–29)
Calcium: 9.6 mg/dL (ref 8.6–10.2)
Chloride: 95 mmol/L — ABNORMAL LOW (ref 96–106)
Creatinine, Ser: 1.01 mg/dL (ref 0.76–1.27)
GFR calc Af Amer: 78 mL/min/{1.73_m2} (ref >59)
GFR calc non Af Amer: 68 mL/min/{1.73_m2} (ref >59)
Glucose: 139 mg/dL — ABNORMAL HIGH (ref 65–99)
Potassium: 4 mmol/L (ref 3.5–5.2)
Sodium: 139 mmol/L (ref 134–144)

## 2017-07-11 DIAGNOSIS — I5042 Chronic combined systolic (congestive) and diastolic (congestive) heart failure: Secondary | ICD-10-CM | POA: Diagnosis not present

## 2017-07-11 DIAGNOSIS — J441 Chronic obstructive pulmonary disease with (acute) exacerbation: Secondary | ICD-10-CM | POA: Diagnosis not present

## 2017-07-11 DIAGNOSIS — I25110 Atherosclerotic heart disease of native coronary artery with unstable angina pectoris: Secondary | ICD-10-CM | POA: Diagnosis not present

## 2017-07-11 DIAGNOSIS — J9611 Chronic respiratory failure with hypoxia: Secondary | ICD-10-CM | POA: Diagnosis not present

## 2017-07-11 DIAGNOSIS — I110 Hypertensive heart disease with heart failure: Secondary | ICD-10-CM | POA: Diagnosis not present

## 2017-07-11 DIAGNOSIS — I739 Peripheral vascular disease, unspecified: Secondary | ICD-10-CM | POA: Diagnosis not present

## 2017-07-16 ENCOUNTER — Telehealth: Payer: Self-pay | Admitting: *Deleted

## 2017-07-16 DIAGNOSIS — I110 Hypertensive heart disease with heart failure: Secondary | ICD-10-CM | POA: Diagnosis not present

## 2017-07-16 DIAGNOSIS — J441 Chronic obstructive pulmonary disease with (acute) exacerbation: Secondary | ICD-10-CM | POA: Diagnosis not present

## 2017-07-16 DIAGNOSIS — I739 Peripheral vascular disease, unspecified: Secondary | ICD-10-CM | POA: Diagnosis not present

## 2017-07-16 DIAGNOSIS — I25110 Atherosclerotic heart disease of native coronary artery with unstable angina pectoris: Secondary | ICD-10-CM | POA: Diagnosis not present

## 2017-07-16 DIAGNOSIS — I5042 Chronic combined systolic (congestive) and diastolic (congestive) heart failure: Secondary | ICD-10-CM | POA: Diagnosis not present

## 2017-07-16 DIAGNOSIS — J9611 Chronic respiratory failure with hypoxia: Secondary | ICD-10-CM | POA: Diagnosis not present

## 2017-07-16 NOTE — Telephone Encounter (Signed)
Contacted and spoke with Mrs. Alligood to arrange home visit. Visit scheduled for 07/19/17 at 2 pm.

## 2017-07-18 DIAGNOSIS — I5042 Chronic combined systolic (congestive) and diastolic (congestive) heart failure: Secondary | ICD-10-CM | POA: Diagnosis not present

## 2017-07-18 DIAGNOSIS — I110 Hypertensive heart disease with heart failure: Secondary | ICD-10-CM | POA: Diagnosis not present

## 2017-07-18 DIAGNOSIS — J441 Chronic obstructive pulmonary disease with (acute) exacerbation: Secondary | ICD-10-CM | POA: Diagnosis not present

## 2017-07-18 DIAGNOSIS — I739 Peripheral vascular disease, unspecified: Secondary | ICD-10-CM | POA: Diagnosis not present

## 2017-07-18 DIAGNOSIS — I25110 Atherosclerotic heart disease of native coronary artery with unstable angina pectoris: Secondary | ICD-10-CM | POA: Diagnosis not present

## 2017-07-18 DIAGNOSIS — J9611 Chronic respiratory failure with hypoxia: Secondary | ICD-10-CM | POA: Diagnosis not present

## 2017-07-19 ENCOUNTER — Other Ambulatory Visit: Payer: Medicare Other | Admitting: *Deleted

## 2017-07-19 ENCOUNTER — Other Ambulatory Visit: Payer: Medicare Other | Admitting: Licensed Clinical Social Worker

## 2017-07-19 DIAGNOSIS — Z515 Encounter for palliative care: Secondary | ICD-10-CM

## 2017-07-19 NOTE — Progress Notes (Signed)
COMMUNITY PALLIATIVE CARE RN NOTE  PATIENT NAME: [name] DOB: [birth year] MRN: [id number]  PRIMARY CARE PROVIDER: [name], MD  RESPONSIBLE PARTY:  Acct ID - Guarantor Home Phone Work Phone Relationship Acct Type  [id number] - Pargas,Gervis L [phone]  Self P/F     [place], [name], [place] [address]    PLAN OF CARE and INTERVENTION:  1. ADVANCE CARE PLANNING/GOALS OF CARE: Remain at home with his wife for as long as possible, avoid hospitalizations 2. PATIENT/CAREGIVER EDUCATION: Reinforced Safety/Fall Precautions and Energy Conservation 3. DISEASE STATUS: Joint visit made with [place]. Wife reports that patient has not been feeling well today and was lying in bed upon our arrival. Patient did come downstairs to join [place]. Patient denies pain, but is dyspneic upon exertion. Oxygen on at 2L/min via [place]. Purse lip breathing noted throughout much of visit, but patient was able to engage in conversation. Continues to wear Trilogy at night. Reports for the past few days increased dizziness and worsening "brain fog." Advised that he no longer is taking Zoloft. Tried for about 6-7 days but caused patient to feel nervous and jittery. Also reporting increased sinus congestion causing head to feel tight, along with ears feeling more clogged. Feels that it is affecting his vision and hearing. Patient has a long history of sinus issues, but says typical medications used for his sinus congestion does not appear to be working. Sinus drainage was green and thick in appearance this am. Patient states that he has a standing order for a Z-pak and is going to contact PCP to request. Wife reports that 2 weeks ago, patient required the use of his walker due to increased generalized weakness. Patient has been working [employer] [facility] for the past 2 weeks (2x/week) following this occurrence and feels that it is helping and he has not had to use his walker since. Intake is fair. Wife states  patient is gradually losing weight but unsure if it is due to fluid reduction or not. Current wt 125 lbs which is a 4 lb weight loss over the past month. Continues taking Aldactone for edema and feels that it has been helpful. BPs have been more stable and less swelling noted. Patient remains able to dress and bathe himself, but requires frequent rest periods. Patient travelled outside of his home with his daughter and wife to the Fourth of July Celebration in [place] and really enjoyed this. Patient wants a referral to see an Allergist and Neurologist. Advised that PCP will have to give referral. Patient/wife verbalized understanding. Patient and wife requesting weekly visits to check on patient.  HISTORY OF PRESENT ILLNESS:  This is a 84 yo male who continues to be followed by Palliative Care Services to assess overall condition and assist with any symptom management needs. Next visit scheduled in 1 week.  CODE STATUS: FULL CODE ADVANCED DIRECTIVES: Y MOST FORM: no PPS: 50%   PHYSICAL EXAM:   VITALS: Today's Vitals   07/19/17 1513  BP: 120/68  Pulse: 89  Resp: 20  SpO2: 96%  Weight: 125 lb (56.7 kg)  Height: 5\' 8"  (1.727 m)  PainSc: 0-No pain    LUNGS: decreased breath sounds CARDIAC: Cor RRR EXTREMITIES: Trace edema in R foot/ankle, 1+ in L foot/ankle SKIN: Thin/frail skin, bruising noted to bilateral hands  NEURO: Alert and oriented x 3, pleasant mood, generalized weakness, ambulatory    (Duration of visit and documentation 60 minutes)   [name], RN,BSN

## 2017-07-20 NOTE — Progress Notes (Signed)
COMMUNITY PALLIATIVE CARE SW NOTE  PATIENT NAME: [name] DOB: 1929-11-08 MRN: [id number]  PRIMARY CARE PROVIDER: [name], MD  RESPONSIBLE PARTY:  Acct ID - Guarantor Home Phone Work Phone Relationship Acct Type  [id number] - Hribar,Carel L [phone]  Self P/F     [place], [name], [place] [address]     PLAN OF CARE and INTERVENTIONS:             1. GOALS OF CARE/ ADVANCE CARE PLANNING:  Patient wants to remain at home with his wife.  He is a full code. 2. SOCIAL/EMOTIONAL/SPIRITUAL ASSESSMENT/ INTERVENTIONS:  SW and Palliative Care RN, [name], met with patient and his wife, [name].  Patient reported having a "bad day" with increased sinus pressure.  RN addressed with patient and his wife.  SW provided active listening and supportive counseling while patient discussed his physical therapy.  He discontinued using his Zoloft due to increased anxiety.  He said his niece is a Teacher, music and has discussed this with her also.  SW provided education regarding depression.  Patient and his wife appear more relaxed and open during visits. 3. PATIENT/CAREGIVER EDUCATION/ COPING:  Patient copes by expressing his feelings openly.  Patient's wife provides additional information regarding patient's status and current concerns. 4. PERSONAL EMERGENCY PLAN:  Patient rests when he becomes short of breath. 5. COMMUNITY RESOURCES COORDINATION/ HEALTH CARE NAVIGATION:  Patient receives physical therapy two times per week.  He said he enjoys this and makes him more accountable. 6. FINANCIAL/LEGAL CONCERNS/INTERVENTIONS:  None     SOCIAL HX:  Social History   Tobacco Use  . Smoking status: Former Smoker    Packs/day: 2.00    Years: 33.00    Pack years: 66.00    Types: Cigarettes    Last attempt to quit: 03/25/1982    Years since quitting: 35.3  . Smokeless tobacco: Never Used  Substance Use Topics  . Alcohol use: Yes    Alcohol/week: 0.0 oz    Comment: 1.5-3 oz daily in the  past, minimal use in the last few months    CODE STATUS:  Full Code  ADVANCED DIRECTIVES: N MOST FORM COMPLETE:  N HOSPICE EDUCATION PROVIDED:  Not during current visit. PPS:  Patient's appetite is normal.  Patient said he has lost four pounds. Duration of visit and documentation:  75 minutes.      Creola Corn Kindell Strada, LCSW

## 2017-07-23 ENCOUNTER — Telehealth: Payer: Self-pay | Admitting: *Deleted

## 2017-07-23 NOTE — Telephone Encounter (Signed)
Contacted and spoke with patient's wife to arrange visit for this week. Visit scheduled for 07/26/17 at 3:30p.

## 2017-07-24 ENCOUNTER — Telehealth: Payer: Self-pay | Admitting: Internal Medicine

## 2017-07-24 DIAGNOSIS — I5042 Chronic combined systolic (congestive) and diastolic (congestive) heart failure: Secondary | ICD-10-CM | POA: Diagnosis not present

## 2017-07-24 DIAGNOSIS — F322 Major depressive disorder, single episode, severe without psychotic features: Secondary | ICD-10-CM | POA: Diagnosis not present

## 2017-07-24 DIAGNOSIS — I110 Hypertensive heart disease with heart failure: Secondary | ICD-10-CM | POA: Diagnosis not present

## 2017-07-24 DIAGNOSIS — J441 Chronic obstructive pulmonary disease with (acute) exacerbation: Secondary | ICD-10-CM | POA: Diagnosis not present

## 2017-07-24 DIAGNOSIS — I739 Peripheral vascular disease, unspecified: Secondary | ICD-10-CM | POA: Diagnosis not present

## 2017-07-24 DIAGNOSIS — I25110 Atherosclerotic heart disease of native coronary artery with unstable angina pectoris: Secondary | ICD-10-CM | POA: Diagnosis not present

## 2017-07-24 DIAGNOSIS — J9611 Chronic respiratory failure with hypoxia: Secondary | ICD-10-CM | POA: Diagnosis not present

## 2017-07-24 NOTE — Telephone Encounter (Signed)
LMTCB for [employer] at [facility].  I see where patient was last seen by [facility] for nursing visit prior to Hospice taking over.

## 2017-07-25 NOTE — Telephone Encounter (Signed)
Attempted to call [facility] with [facility]. I did not receive an answer. I have left a message for Claiborne [name] to return our call.

## 2017-07-25 NOTE — Telephone Encounter (Signed)
Claiborne [name] calling back from [facility] - she can be reached at [phone]

## 2017-07-25 NOTE — Telephone Encounter (Signed)
Attempted to call Claiborne [name] with [facility] at phone [phone]. I did not receive an answer at time of call. I have left a voicemail message for pt to return call. X1

## 2017-07-26 ENCOUNTER — Other Ambulatory Visit: Payer: Medicare Other | Admitting: *Deleted

## 2017-07-26 ENCOUNTER — Other Ambulatory Visit: Payer: Medicare Other | Admitting: Licensed Clinical Social Worker

## 2017-07-26 DIAGNOSIS — Z515 Encounter for palliative care: Secondary | ICD-10-CM

## 2017-07-26 NOTE — Progress Notes (Signed)
COMMUNITY PALLIATIVE CARE SW NOTE  PATIENT NAME: [name] DOB: 03/17/1929 MRN: [id number]  PRIMARY CARE PROVIDER: Ross, Charles Alan, MD  RESPONSIBLE PARTY:  Acct ID - Guarantor Home Phone Work Phone Relationship Acct Type  [id number] - Belt,Hyde L [phone]  Self P/F     [address], [place], [place] [address]     PLAN OF CARE and INTERVENTIONS:             1. GOALS OF CARE/ ADVANCE CARE PLANNING:  Patient wishes to remain in his home with his wife.  He is a full code. 2. SOCIAL/EMOTIONAL/SPIRITUAL ASSESSMENT/ INTERVENTIONS:  SW and Palliative Care RN, Monishia Howard, met with patient and his wife, [name].  [name] stated she slept wrong last night and took som aspirin.  Patient complained of not feeling good at all.  He stated his sinuses were still clogged.  Patient has an appointment with his primary MD tomorrow.  The couple discussed not being able to go on their annual trip to [place] this year.  They both appeared sad about this realization.  SW provided active listening and supportive counseling. 3. PATIENT/CAREGIVER EDUCATION/ COPING:  Continue providing Palliative Care education.  Patient and his wife express their feelings openly. 4. PERSONAL EMERGENCY PLAN:  Patient will rest or take a breathing treatment when needed.  Wife will call 911 if appropriate. 5. COMMUNITY RESOURCES COORDINATION/ HEALTH CARE NAVIGATION:  None 6. FINANCIAL/LEGAL CONCERNS/INTERVENTIONS:  None     SOCIAL HX:  Social History   Tobacco Use  . Smoking status: Former Smoker    Packs/day: 2.00    Years: 33.00    Pack years: 66.00    Types: Cigarettes    Last attempt to quit: 03/25/1982    Years since quitting: 35.3  . Smokeless tobacco: Never Used  Substance Use Topics  . Alcohol use: Yes    Alcohol/week: 0.0 oz    Comment: 1.5-3 oz daily in the past, minimal use in the last few months    CODE STATUS:  Full Code ADVANCED DIRECTIVES: N MOST FORM COMPLETE:  N HOSPICE EDUCATION PROVIDED:  No PPS:  Patient's appetite is normal and his weight has remained stable.  He can stand and ambulate independently. Duration of visit and documentation:  75 minutes.       Z , LCSW 

## 2017-07-26 NOTE — Telephone Encounter (Signed)
Attempted to call [facility] with [facility]. I did not receive an answer. I have left a message for Claiborne [name] to return our call.

## 2017-07-27 ENCOUNTER — Telehealth: Payer: Self-pay | Admitting: *Deleted

## 2017-07-27 DIAGNOSIS — J329 Chronic sinusitis, unspecified: Secondary | ICD-10-CM | POA: Diagnosis not present

## 2017-07-27 DIAGNOSIS — R0981 Nasal congestion: Secondary | ICD-10-CM | POA: Diagnosis not present

## 2017-07-27 DIAGNOSIS — R251 Tremor, unspecified: Secondary | ICD-10-CM | POA: Diagnosis not present

## 2017-07-27 NOTE — Telephone Encounter (Signed)
Attempted to call [facility]. Left message to call back.

## 2017-07-30 DIAGNOSIS — I110 Hypertensive heart disease with heart failure: Secondary | ICD-10-CM | POA: Diagnosis not present

## 2017-07-30 DIAGNOSIS — I25110 Atherosclerotic heart disease of native coronary artery with unstable angina pectoris: Secondary | ICD-10-CM | POA: Diagnosis not present

## 2017-07-30 DIAGNOSIS — J441 Chronic obstructive pulmonary disease with (acute) exacerbation: Secondary | ICD-10-CM | POA: Diagnosis not present

## 2017-07-30 DIAGNOSIS — I5042 Chronic combined systolic (congestive) and diastolic (congestive) heart failure: Secondary | ICD-10-CM | POA: Diagnosis not present

## 2017-07-30 DIAGNOSIS — J9611 Chronic respiratory failure with hypoxia: Secondary | ICD-10-CM | POA: Diagnosis not present

## 2017-07-30 DIAGNOSIS — I739 Peripheral vascular disease, unspecified: Secondary | ICD-10-CM | POA: Diagnosis not present

## 2017-07-30 NOTE — Telephone Encounter (Signed)
MW please advise if ok to extend PT as requested.  Thanks!

## 2017-07-30 NOTE — Telephone Encounter (Signed)
Left detailed message for [facility] at [facility] to give approval for PT extension.  Nothing further needed.

## 2017-07-30 NOTE — Telephone Encounter (Signed)
Claiborne [name], [facility], calling back for verbal orders to extend physical therapy for 2-3 times a week for two weeks and then D/C to outpatient therapy. Ok to leave vm. Cb is [phone].

## 2017-07-30 NOTE — Telephone Encounter (Signed)
Fine with me

## 2017-07-30 NOTE — Progress Notes (Signed)
COMMUNITY PALLIATIVE CARE RN NOTE  PATIENT NAME: [name] DOB: Sep 25, 1929 MRN: [id number]  PRIMARY CARE PROVIDER: [name], MD  RESPONSIBLE PARTY:  Acct ID - Guarantor Home Phone Work Phone Relationship Acct Type  [id number] - [name] [phone]  Self P/F     [name], [name] [address]    PLAN OF CARE and INTERVENTION:  1. ADVANCE CARE PLANNING/GOALS OF CARE: Remain at home with his wife as long as possible, avoid hospitalizations 2. PATIENT/CAREGIVER EDUCATION: Reinforced Safety/Fall Precautions, Energy Conservation 3. DISEASE STATUS: Joint visit made with Palliative SW [name]. Patient reports not feeling well today. Wife is noticing some patient confusion. Continues to c/o dizziness and increasing sinus congestion despite recent course of antibiotics (Z-pak). Patient states that head/sinus congestion is causing increased issues with his hearing and vision. States that he is in a constant "brain fog" and "eye focus is off." Patient went to get hearing aids adjusted yesterday, but does not feel that this was helpful. Continues with Flonase and saline nose rinses. Had recent visit with Psychiatrist and the plan is to restart [name], but at a lower dose this time. 12.5 mg x 10 days then will increase to 25mg . Patient has not restarted [name] as of yet. Plans to start tomorrow. Patient wants to see a Neurologist, but wife wants patient to see an Allergist. Patient has an appointment with Dr. Harrington [name] tomorrow, so will discuss issues with MD. Patient also reports having increased issues with breathing. Mainly using inhalers, but states that he had to do a nebulizer treatment following since inhaler was ineffective. Suggested using the nebulizer more regularly vs inhalers, as they can be more effective when breathing is more difficult. Patient to discuss with Pulmonologist. Will continue to visit patient on a weekly basis per wife's request.   HISTORY OF PRESENT ILLNESS:  This is a 84 yo male who resides at home with his wife. Palliative Care Services continue to follow. Next visit scheduled in 1 week.    CODE STATUS: FULL CODE  ADVANCED DIRECTIVES: N MOST FORM: no PPS: 50%   PHYSICAL EXAM:   VITALS: Today's Vitals   07/26/17 1514  BP: 112/64  Pulse: 86  Resp: 20  SpO2: 97%  Weight: 125 lb 9.6 oz (57 kg)  PainSc: 0-No pain    LUNGS: decreased breath sounds CARDIAC: Cor RRR EXTREMITIES: Trace edema to R foot/ankle, 1+ in [name] foot/ankle SKIN: Exposed skin dry and intact  NEURO: Alert and oriented x 3, generalized weakness, ambulatory   (Duration of visit and documentation 60 minutes)    [name], RN, BSN

## 2017-08-01 DIAGNOSIS — F322 Major depressive disorder, single episode, severe without psychotic features: Secondary | ICD-10-CM | POA: Diagnosis not present

## 2017-08-02 DIAGNOSIS — I739 Peripheral vascular disease, unspecified: Secondary | ICD-10-CM | POA: Diagnosis not present

## 2017-08-02 DIAGNOSIS — I110 Hypertensive heart disease with heart failure: Secondary | ICD-10-CM | POA: Diagnosis not present

## 2017-08-02 DIAGNOSIS — J441 Chronic obstructive pulmonary disease with (acute) exacerbation: Secondary | ICD-10-CM | POA: Diagnosis not present

## 2017-08-02 DIAGNOSIS — I25110 Atherosclerotic heart disease of native coronary artery with unstable angina pectoris: Secondary | ICD-10-CM | POA: Diagnosis not present

## 2017-08-02 DIAGNOSIS — I5042 Chronic combined systolic (congestive) and diastolic (congestive) heart failure: Secondary | ICD-10-CM | POA: Diagnosis not present

## 2017-08-02 DIAGNOSIS — J9611 Chronic respiratory failure with hypoxia: Secondary | ICD-10-CM | POA: Diagnosis not present

## 2017-08-03 ENCOUNTER — Other Ambulatory Visit: Payer: Medicare Other | Admitting: *Deleted

## 2017-08-03 DIAGNOSIS — I110 Hypertensive heart disease with heart failure: Secondary | ICD-10-CM | POA: Diagnosis not present

## 2017-08-03 DIAGNOSIS — Z515 Encounter for palliative care: Secondary | ICD-10-CM

## 2017-08-03 DIAGNOSIS — I25110 Atherosclerotic heart disease of native coronary artery with unstable angina pectoris: Secondary | ICD-10-CM | POA: Diagnosis not present

## 2017-08-03 DIAGNOSIS — J441 Chronic obstructive pulmonary disease with (acute) exacerbation: Secondary | ICD-10-CM | POA: Diagnosis not present

## 2017-08-03 DIAGNOSIS — I5042 Chronic combined systolic (congestive) and diastolic (congestive) heart failure: Secondary | ICD-10-CM | POA: Diagnosis not present

## 2017-08-03 DIAGNOSIS — I739 Peripheral vascular disease, unspecified: Secondary | ICD-10-CM | POA: Diagnosis not present

## 2017-08-03 DIAGNOSIS — J9611 Chronic respiratory failure with hypoxia: Secondary | ICD-10-CM | POA: Diagnosis not present

## 2017-08-04 ENCOUNTER — Other Ambulatory Visit: Payer: Self-pay | Admitting: Internal Medicine

## 2017-08-07 ENCOUNTER — Ambulatory Visit (INDEPENDENT_AMBULATORY_CARE_PROVIDER_SITE_OTHER): Payer: Medicare Other | Admitting: Adult Health

## 2017-08-07 ENCOUNTER — Encounter: Payer: Self-pay | Admitting: Adult Health

## 2017-08-07 DIAGNOSIS — J449 Chronic obstructive pulmonary disease, unspecified: Secondary | ICD-10-CM | POA: Diagnosis not present

## 2017-08-07 DIAGNOSIS — I5032 Chronic diastolic (congestive) heart failure: Secondary | ICD-10-CM

## 2017-08-07 DIAGNOSIS — J9611 Chronic respiratory failure with hypoxia: Secondary | ICD-10-CM | POA: Diagnosis not present

## 2017-08-07 DIAGNOSIS — J9612 Chronic respiratory failure with hypercapnia: Secondary | ICD-10-CM | POA: Diagnosis not present

## 2017-08-07 NOTE — Progress Notes (Signed)
@Patient  ID: [name], male    DOB: November 09, 1929, 84 y.o.   MRN: [id number]  Chief Complaint  Patient presents with  . Follow-up    COPD     Referring provider: [name], MD HPI: 84yo male former smoker (1983) followed for GOLD III COPD w/ asthma component , O2 dependent RF with act and At bedtime    TEST /Events  PFTs 05/04/05 FEV1 36% ratio 28% with 29% improvement after bronchodilators DLCO 48%  - PFT's 03/26/08 30% ratio 34 with 14% improvement after bronchodilators DLC0 38 %  - Referred to rehab 12/19/2012 >completed March 2015  - changed to spiriva respimat 02/14/2013  - started daily prednisone 06/19/13 >improved only a little>tapered off mid July 2015 >ok to change to prn prednisone 08/27/2013  - flared off nasonex and gerd rx 09/21/2014 > resumed - 10/19/2014 p extensive coaching HFA effectiveness = 90%  - changed pred to ceiling of 20 / floor of 5 mg daily as of 10/19/2014 >changed to floor of 10 mg daily 10/04/2015  - PFT's 06/08/2015 FEV1 0.58 (22 % ) ratio 27 p No sign % improvement from saba p symb/spiriva prior to study with DLCO 9/9 % corrects to 27 % for alv volume  Start noct 02 at 2lpm 2008 and on 03/04/09 desat @ >185 ft so rec wear with activtiy >rm to rm  - 2/6/2013Walked RA x one lap @ 185 stopped due to desat >corrected on 2lpm - 02/26/2014 Walked 2lpm 2 laps @ 185 ft each stopped due to Sob/ sats 88% at nl pace  - 10/10/2016Walked RA 2 laps @ 185 ft each stopped due to End of study, slow pace, sob But no desat  - HCO3 10/27/15 = 34  08/07/2017 Follow up: COPD , O2 RF  Patient returns for a one-month follow-up.  Patient says since last visit he is actually feeling slightly better.  Feels like that he is made some slight progress.  He is not as short of breath and fatigue level is  improved slightly. Aldactone 50mg  added last office visit. Cardiology decreased dose of aldactone (12.5mg  ). And D/c Advil cold and sinus .   Does feel that his breathing is better since starting Aldactone and leg swelling has decreased. He is receiving physical therapy at home and feels like that he has gotten slightly stronger.  He is actually been able to walk slowly on the treadmill for a few minutes. He denies any chest pain, orthopnea, abdominal pain or vomiting. Says he does plan on seeing an allergist in the upcoming future.  He brought in all his medications and organized them into a medication encounter with patient education.  He appears to be taking his medications correctly.    Allergies  Allergen Reactions  . Cefdinir Other (See Comments)    Severe Diarrhea  . Nitrofurantoin Swelling    Hand Swelling  . Sulfa Antibiotics Anaphylaxis and Swelling  . Sulfonamide Derivatives Swelling    REACTION: facial/tongue swelling  . Cefdinir Diarrhea    REACTION: diarrhea  . Ciprofloxacin Itching and Rash    Red itchy hands  . Nitrofurantoin Swelling    Swollen hands  . Ciprofloxacin Itching and Rash  . Levaquin [Levofloxacin] Itching and Rash  . Levaquin [Levofloxacin] Itching and Rash    Immunization History  Administered Date(s) Administered  . Influenza Split 10/10/2010, 10/04/2011, 10/14/2013  . Influenza Whole 10/01/2007, 11/06/2008, 11/09/2009  . Influenza, High Dose Seasonal PF 11/07/2016  . Influenza,inj,Quad PF,6+ Mos  09/30/2012, 10/19/2014  . Influenza-Unspecified 09/07/2015  . Pneumococcal Conjugate-13 08/27/2013  . Pneumococcal Polysaccharide-23 12/11/2001, 05/20/2015    Past Medical History:  Diagnosis Date  . Arthritis    "generalized" (04/04/2017)  . Asthma   . Basal cell carcinoma    "left ear"  . BPH (benign prostatic hyperplasia)    severe; s/p multiple biopsies  . CAD (coronary artery disease)   . Carotid artery occlusion   . Chronic respiratory failure ([place])   . Chronic rhinitis   . COPD (chronic obstructive pulmonary disease) (HCC)    2L [place] O2  . Diastolic heart failure ([place]) 2019   . Elevated troponin 03/09/2017  . GERD (gastroesophageal reflux disease)   . History of blood transfusion    "w/his CABG" (04/04/2017)  . History of kidney stones   . Hyperlipidemia   . Hypertension   . On home oxygen therapy    "~ 24/7" (04/04/2017)  . Peripheral vascular disease ([place])   . Pneumonia 2019  . Syncope and collapse   . Ureteral tumor 08/2015   had endoscopic procedure for evaluation, unable to reach for biopsy    Tobacco History: Social History   Tobacco Use  Smoking Status Former Smoker  . Packs/day: 2.00  . Years: 33.00  . Pack years: 66.00  . Types: Cigarettes  . Last attempt to quit: 03/25/1982  . Years since quitting: 35.3  Smokeless Tobacco Never Used   Counseling given: Not Answered   Outpatient Medications Prior to Visit  Medication Sig Dispense Refill  . ALPRAZolam (XANAX) 0.25 MG tablet Take half (1/2) tablet by mouth three (3) times daily.    Marland Kitchen aspirin 81 MG tablet Take 81 mg by mouth daily.    Marland Kitchen atorvastatin (LIPITOR) 20 MG tablet TAKE 1 TABLET BY MOUTH EVERY DAY 90 tablet 2  . Cholecalciferol (VITAMIN D3) 2000 units TABS Take 2,000 Units by mouth daily.    . clobetasol cream (TEMOVATE) 1.47 % Apply 1 application topically as directed.    . Cyanocobalamin (VITAMIN B 12 PO) Take 1 tablet daily by mouth.     . finasteride (PROSCAR) 5 MG tablet Take 5 mg by mouth every morning.    . furosemide (LASIX) 20 MG tablet TAKE 1 TABLET BY MOUTH DAILY. CAN TAKE ADDITIONAL PILL IF NEEDED FOR SWELLING 45 tablet 2  . guaiFENesin (MUCINEX) 600 MG 12 hr tablet Take 600 mg by mouth 2 (two) times daily as needed for cough.     . hydrALAZINE (APRESOLINE) 25 MG tablet Take 25 mg by mouth every 6 (six) hours as needed.    . levalbuterol (XOPENEX HFA) 45 MCG/ACT inhaler INHALE 2 PUFFS BY MOUTH EVERY 4 HOURS AS NEEDED FOR WHEEZING OR SHORTNESS OF BREATH 15 g 3  . levalbuterol (XOPENEX) 0.63 MG/3ML nebulizer solution USE ONE VIAL VIA NEBULIZER EVERY 4 HOURS AS NEEDED FOR  WHEEZING OR SHORTNESS OF BREATH 300 mL 3  . metoprolol succinate (TOPROL-XL) 25 MG 24 hr tablet Take 0.5 tablets (12.5 mg total) by mouth daily. 45 tablet 3  . mometasone (NASONEX) 50 MCG/ACT nasal spray SHAKE LIQUID AND USE 2 SPRAYS IN EACH NOSTRIL DAILY 17 g 11  . Multiple Vitamins-Minerals (PRESERVISION AREDS 2) CAPS Take 1 capsule by mouth daily.    . OXYGEN Inhale 2 L continuous into the lungs.     . pantoprazole (PROTONIX) 40 MG tablet Take 40 mg by mouth daily.    . predniSONE (DELTASONE) 10 MG tablet Take 1-2 tablets (10-20 mg total) by  mouth daily with breakfast. 60 tablet 2  . ranitidine (ZANTAC) 150 MG tablet Take 150 mg by mouth 2 (two) times daily.  0  . sertraline (ZOLOFT) 50 MG tablet Take 25 mg by mouth daily.    Marland Kitchen SPIRIVA RESPIMAT 2.5 MCG/ACT AERS Inhale 2 puffs into the lungs daily.  11  . spironolactone (ALDACTONE) 25 MG tablet Take 0.5 tablets (12.5 mg total) by mouth daily. (Patient taking differently: Take 0.125 mg by mouth daily. ) 45 tablet 3  . SYMBICORT 160-4.5 MCG/ACT inhaler INHALE 2 PUFFS INTO THE LUNGS EVERY MORNING AND ANOTHER 2 PUFFS ABOUT 12 HOURS LATER 10.2 g 11   No facility-administered medications prior to visit.      Review of Systems  Constitutional:   No  weight loss, night sweats,  Fevers, chills,  +fatigue, or  lassitude.  HEENT:   No headaches,  Difficulty swallowing,  Tooth/dental problems, or  Sore throat,                No sneezing, itching, ear ache, nasal congestion, post nasal drip,   CV:  No chest pain,  Orthopnea, PND, swelling in lower extremities, anasarca, dizziness, palpitations, syncope.   GI  No heartburn, indigestion, abdominal pain, nausea, vomiting, diarrhea, change in bowel habits, loss of appetite, bloody stools.   Resp:  .  No chest wall deformity  Skin: no rash or lesions.  GU: no dysuria, change in color of urine, no urgency or frequency.  No flank pain, no hematuria   MS:  No joint pain or swelling.  No decreased  range of motion.  No back pain.    Physical Exam  BP 138/80 (BP Location: Left Arm, Cuff Size: Normal)   Pulse 88   Ht 5\' 9"  (1.753 m)   Wt 129 lb 12.8 oz (58.9 kg)   SpO2 93% Comment: on 2 liters of O2  BMI 19.17 kg/m   GEN: A/Ox3; pleasant , NAD, chronically ill-appearing on oxygen   HEENT:  [place]/AT,  EACs-clear, TMs-wnl, NOSE-clear, THROAT-clear, no lesions, no postnasal drip or exudate noted.   NECK:  Supple w/ fair ROM; no JVD; normal carotid impulses w/o bruits; no thyromegaly or nodules palpated; no lymphadenopathy.    RESP few trace rhonchi  no accessory muscle use, no dullness to percussion  CARD:  RRR, no m/r/g, 1+ peripheral edema, pulses intact, no cyanosis or clubbing.  GI:   Soft & nt; nml bowel sounds; no organomegaly or masses detected.   Musco: Warm bil, no deformities or joint swelling noted.   Neuro: alert, no focal deficits noted.    Skin: Warm, no lesions or rashes    Lab Results:  CBC  BNP  Imaging: No results found.   Assessment & Plan:   COPD   GOLD IV/ 02/steroid dep  Currently stable, prone to exacerbations  Patient's medications were reviewed today and patient education was given. Computerized medication calendar was adjusted/completed   Plan  Patient Instructions  Follow med calendar closely and bring to each visit.  Continue on Oxygen 2l/m  Continue on Trilogy Vent .At bedtime   Please contact office for sooner follow up if symptoms do not improve or worsen or seek emergency care   Follow up Dr. [name]  In 6- weeks and and As needed.      Chronic respiratory failure with hypoxia and hypercapnia (HCC) Cont on O2   Chronic diastolic CHF (congestive heart failure) (HCC) Improved on aldactone , cont on current diuretics  Plan  Patient Instructions  Follow med calendar closely and bring to each visit.  Continue on Oxygen 2l/m  Continue on Trilogy Vent .At bedtime   Please contact office for sooner follow up if symptoms do not  improve or worsen or seek emergency care   Follow up Dr. [name]  In 6- weeks and and As needed.         Rexene Edison, NP 08/07/2017

## 2017-08-07 NOTE — Assessment & Plan Note (Signed)
Currently stable, prone to exacerbations  Patient's medications were reviewed today and patient education was given. Computerized medication calendar was adjusted/completed   Plan  Patient Instructions  Follow med calendar closely and bring to each visit.  Continue on Oxygen 2l/m  Continue on Trilogy Vent .At bedtime   Please contact office for sooner follow up if symptoms do not improve or worsen or seek emergency care   Follow up Dr. [name]  In 6- weeks and and As needed.

## 2017-08-07 NOTE — Assessment & Plan Note (Signed)
Cont on O2 .  

## 2017-08-07 NOTE — Addendum Note (Signed)
Addended by: Parke Poisson E on: 08/07/2017 05:29 PM   Modules accepted: Orders

## 2017-08-07 NOTE — Progress Notes (Signed)
COMMUNITY PALLIATIVE CARE RN NOTE  PATIENT NAME: ERICSON NAFZIGER DOB: [birth year] MRN: [id number]  PRIMARY CARE PROVIDER: [name], MD  RESPONSIBLE PARTY:  Acct ID - Guarantor Home Phone Work Phone Relationship Acct Type  [id number] - Leger,Zubin L [phone]  Self P/F     [place], [name], [place] [address]    PLAN OF CARE and INTERVENTION:  1. ADVANCE CARE PLANNING/GOALS OF CARE: Remain at home for as long as possible with wife, avoid hospitalizations 2. PATIENT/CAREGIVER EDUCATION: Reinforced Safety/Fall Precautions, Energy Conservation 3. DISEASE STATUS: Patient reports not feeling well today. Patient states that he did not sleep well last pm so he did not wake up today until late and is just finishing breakfast.  Patient currently on Amoxicillin 1000 mg BID for sinus infection. Reports continued dizziness with standing/ambulating and reports an upset stomach. He does admit that since taking Amoxicillin that he is not as congested and the mucus is loosening up. Notice patient clearing throat more often throughout visit today and says he is able to expectorate more. Also using Nasonex for his sinus congestion which he states is helpful. A referral was made to an Allergist during his last appointment. Patient to call to set up appointment. Patient utilizing his nebulizer more often after use of inhalers to help with dyspnea. Patient has Pulmonology appointment next week. Patient reports that he is hearing better since he had his hearing aids adjusted, however his own voice is very loud and "annoying" so he does not wear all the time. He has not started his Zoloft as of yet at the lower dose of 12.5 mg, but will start once he has completed his current course of antibiotics. Patient worked with PT yesterday and walked for 3 minutes vs his usual 2 minutes. He feels that he is progressing, but continues to c/o weakness to bilateral arms. Will continue to visit patient weekly as requested by  patient/wife.  HISTORY OF PRESENT ILLNESS:  This is a 84 yo male who resides at home with his wife. Palliative Care to continue to follow patient. Next visit scheduled in 1 week.   CODE STATUS: FULL CODE ADVANCED DIRECTIVES: N MOST FORM: no PPS: 50%   PHYSICAL EXAM:   VITALS: Today's Vitals   08/03/17 1344  BP: 122/68  Pulse: 81  Resp: 19  SpO2: 96%  PainSc: 0-No pain    LUNGS: decreased breath sounds CARDIAC: HR irregular EXTREMITIES: Trace edema noted to bilateral ankles/feet SKIN: Exposed skin dry and intact, scattered bruising to bilateral hands/arms  NEURO: Alert and oriented x 3, pleasant mood, generalized weakness, ambulatory   (Duration of visit and documentation 60 minutes)    [name], RN, BSN

## 2017-08-07 NOTE — Assessment & Plan Note (Signed)
Improved on aldactone , cont on current diuretics  Plan  Patient Instructions  Follow med calendar closely and bring to each visit.  Continue on Oxygen 2l/m  Continue on Trilogy Vent .At bedtime   Please contact office for sooner follow up if symptoms do not improve or worsen or seek emergency care   Follow up Dr. [name]  In 6- weeks and and As needed.

## 2017-08-07 NOTE — Patient Instructions (Addendum)
Follow med calendar closely and bring to each visit.  Continue on Oxygen 2l/m  Continue on Trilogy Vent .At bedtime   Please contact office for sooner follow up if symptoms do not improve or worsen or seek emergency care   Follow up Dr. [name]  In 6- weeks and and As needed.

## 2017-08-08 ENCOUNTER — Encounter: Payer: Medicare Other | Admitting: Internal Medicine

## 2017-08-09 ENCOUNTER — Telehealth: Payer: Self-pay | Admitting: Internal Medicine

## 2017-08-09 ENCOUNTER — Other Ambulatory Visit: Payer: Self-pay | Admitting: Internal Medicine

## 2017-08-09 DIAGNOSIS — J449 Chronic obstructive pulmonary disease, unspecified: Secondary | ICD-10-CM | POA: Diagnosis not present

## 2017-08-09 DIAGNOSIS — J3089 Other allergic rhinitis: Secondary | ICD-10-CM | POA: Diagnosis not present

## 2017-08-09 MED ORDER — ALPRAZOLAM 0.25 MG PO TABS: ORAL_TABLET | ORAL | 0 refills | Status: DC

## 2017-08-09 NOTE — Telephone Encounter (Signed)
Spoke with the pt  He is out of alprazolam and requesting refill asap  MW out of the office

## 2017-08-10 ENCOUNTER — Telehealth: Payer: Self-pay | Admitting: *Deleted

## 2017-08-10 DIAGNOSIS — I5042 Chronic combined systolic (congestive) and diastolic (congestive) heart failure: Secondary | ICD-10-CM | POA: Diagnosis not present

## 2017-08-10 DIAGNOSIS — I25110 Atherosclerotic heart disease of native coronary artery with unstable angina pectoris: Secondary | ICD-10-CM | POA: Diagnosis not present

## 2017-08-10 DIAGNOSIS — I110 Hypertensive heart disease with heart failure: Secondary | ICD-10-CM | POA: Diagnosis not present

## 2017-08-10 DIAGNOSIS — J441 Chronic obstructive pulmonary disease with (acute) exacerbation: Secondary | ICD-10-CM | POA: Diagnosis not present

## 2017-08-10 DIAGNOSIS — J9611 Chronic respiratory failure with hypoxia: Secondary | ICD-10-CM | POA: Diagnosis not present

## 2017-08-10 DIAGNOSIS — I739 Peripheral vascular disease, unspecified: Secondary | ICD-10-CM | POA: Diagnosis not present

## 2017-08-10 NOTE — Telephone Encounter (Signed)
Refill for Alprazolam 0.25mg . MW please advise.

## 2017-08-10 NOTE — Telephone Encounter (Signed)
Contacted and spoke with Mrs. Brickel to inquire about a good time to visit today. This RN has been seeing patient on a weekly basis. Wife states patient was seen by the Allergist yesterday and she feels he does not need a visit today. Wife requests call next week to schedule next visit.

## 2017-08-10 NOTE — Telephone Encounter (Signed)
Called [facility] to cancel Rx for Alprazolam because it was sent in yesterday. Please disregard. Will close encounter.

## 2017-08-13 NOTE — Progress Notes (Signed)
Chart and office note reviewed in detail  > agree with a/p as outlined    

## 2017-08-15 DIAGNOSIS — J3089 Other allergic rhinitis: Secondary | ICD-10-CM | POA: Diagnosis not present

## 2017-08-17 DIAGNOSIS — J449 Chronic obstructive pulmonary disease, unspecified: Secondary | ICD-10-CM | POA: Diagnosis not present

## 2017-08-17 DIAGNOSIS — R531 Weakness: Secondary | ICD-10-CM | POA: Diagnosis not present

## 2017-08-17 DIAGNOSIS — E46 Unspecified protein-calorie malnutrition: Secondary | ICD-10-CM | POA: Diagnosis not present

## 2017-08-20 ENCOUNTER — Other Ambulatory Visit: Payer: Medicare Other | Admitting: *Deleted

## 2017-08-20 ENCOUNTER — Telehealth: Payer: Self-pay | Admitting: *Deleted

## 2017-08-20 DIAGNOSIS — Z515 Encounter for palliative care: Secondary | ICD-10-CM

## 2017-08-20 NOTE — Telephone Encounter (Signed)
Contacted and spoke with patient's wife to schedule home visit for this week. Patient is requesting a visit today. Visit scheduled for today 08/20/17 at 3:30p.

## 2017-08-21 ENCOUNTER — Ambulatory Visit: Payer: Medicare Other | Admitting: Podiatry

## 2017-08-21 ENCOUNTER — Ambulatory Visit (INDEPENDENT_AMBULATORY_CARE_PROVIDER_SITE_OTHER): Payer: Medicare Other | Admitting: Podiatry

## 2017-08-21 ENCOUNTER — Encounter: Payer: Self-pay | Admitting: Podiatry

## 2017-08-21 VITALS — BP 187/98 | HR 109

## 2017-08-21 DIAGNOSIS — M79674 Pain in right toe(s): Secondary | ICD-10-CM | POA: Diagnosis not present

## 2017-08-21 DIAGNOSIS — M79675 Pain in left toe(s): Secondary | ICD-10-CM | POA: Diagnosis not present

## 2017-08-21 DIAGNOSIS — B351 Tinea unguium: Secondary | ICD-10-CM

## 2017-08-21 DIAGNOSIS — I739 Peripheral vascular disease, unspecified: Secondary | ICD-10-CM | POA: Diagnosis not present

## 2017-08-21 DIAGNOSIS — I200 Unstable angina: Secondary | ICD-10-CM | POA: Diagnosis not present

## 2017-08-21 NOTE — Progress Notes (Signed)
This patient presents to the office with thick toenails both feet.  He says the toenail on right big toe is black and he thinks it is fungus.  He says his nails are painful  walking and wearing his shoes.   Patient states he is unable to self treat.  He presents the office today with his wife for treatment. Marland Kitchen He desires nail care for his long thick nails.  He says they have not been done in over 6 months.  General Appearance  Alert, conversant and in no acute stress.  Vascular  Dorsalis pedis and posterior tibial  pulses are not  palpable  Bilaterally due to swelling .  Capillary return is within normal limits  bilaterally. Temperature is within normal limits  bilaterally.  Neurologic  Senn-Weinstein monofilament wire test within normal limits  bilaterally. Muscle power within normal limits bilaterally.  Nails Thick disfigured discolored nails with subungual debris  from hallux to fifth toes bilaterally. No evidence of bacterial infection or drainage bilaterally. Right hallux toenail has self avulsed with no evidence of drainage or infection.  Orthopedic  No limitations of motion of motion feet .  No crepitus or effusions noted.  No bony pathology or digital deformities noted.  Skin  normotropic skin with no porokeratosis noted bilaterally.  No signs of infections or ulcers noted.    Onychomycosis  PVD  IE  Debride nails  X 10.  Removed the unattached nail plate right hallux.  No evidence of drainage noted.   Gardiner Barefoot DPM

## 2017-08-22 DIAGNOSIS — H26493 Other secondary cataract, bilateral: Secondary | ICD-10-CM | POA: Diagnosis not present

## 2017-08-22 DIAGNOSIS — H1851 Endothelial corneal dystrophy: Secondary | ICD-10-CM | POA: Diagnosis not present

## 2017-08-22 DIAGNOSIS — H17823 Peripheral opacity of cornea, bilateral: Secondary | ICD-10-CM | POA: Diagnosis not present

## 2017-08-22 DIAGNOSIS — Z961 Presence of intraocular lens: Secondary | ICD-10-CM | POA: Diagnosis not present

## 2017-08-22 DIAGNOSIS — H18831 Recurrent erosion of cornea, right eye: Secondary | ICD-10-CM | POA: Diagnosis not present

## 2017-08-22 DIAGNOSIS — H35373 Puckering of macula, bilateral: Secondary | ICD-10-CM | POA: Diagnosis not present

## 2017-08-22 DIAGNOSIS — H40053 Ocular hypertension, bilateral: Secondary | ICD-10-CM | POA: Diagnosis not present

## 2017-08-22 DIAGNOSIS — H353131 Nonexudative age-related macular degeneration, bilateral, early dry stage: Secondary | ICD-10-CM | POA: Diagnosis not present

## 2017-08-27 NOTE — Progress Notes (Signed)
COMMUNITY PALLIATIVE CARE RN NOTE  PATIENT NAME: [name] DOB: 1930/01/07 MRN: [id number]  PRIMARY CARE PROVIDER: [name], MD  RESPONSIBLE PARTY:  Acct ID - Guarantor Home Phone Work Phone Relationship Acct Type  [id number] - Reasons,[name] [phone]  Self P/F     [name], [place] [address]    PLAN OF CARE and INTERVENTION:  1. ADVANCE CARE PLANNING/GOALS OF CARE: Remain in his home for as long as possible, avoid hospitalizations 2. PATIENT/CAREGIVER EDUCATION: Reinforced Safety Precautions and Importance of Energy Conservation 3. DISEASE STATUS:  Patient requesting Palliative Care visit today. He reports that he is having a more difficult day today. Continues to report dizziness, head/sinus congestion and states that his vision is more blurry today making it more difficult to focus. He reports starting his Zoloft 3 days ago at 12.5 mg daily and is not sure if this is contributing to his increased fatigue. He states that he is having difficulty keeping his eyes open and feels like going to sleep at any time of day. He had a recent appointment with an Allergist, who performed a skin test. It was negative, but they also did lab work and the results are pending. Nasalcrom spray was suggested for sinus congestion, however he states that it causes too much sinus irritation when using so he stopped. He has been doing sinus rinses each morning, which helps briefly. Also c/o increased overall dyspnea during conversation and any exertion. He is having to utilize his nebulizers every other day. Recommended that since his breathing is becoming more difficult, that he utilize his nebulizers daily vs inhalers as they would most likely be more effective at this point. His Prednisone was recently increased to 20 mg from 10 mg for the last 4-5 days d/t dyspnea. He has completed PT as of last week. He was given exercises to continue with. He tries but tires very easily. He is very concerned  about the tingling in both of his arms and states that he is developing hand tremors. His appetite is slowly decreasing and Pulmicare nutritional supplement was recommended. Will continue to monitor overall patient condition.  HISTORY OF PRESENT ILLNESS: This is a 84 yo male who lives at home with his wife. Palliative Care continues to follow.   CODE STATUS: FULL CODE  ADVANCED DIRECTIVES: N MOST FORM: no PPS: 50%   PHYSICAL EXAM:   VITALS: Today's Vitals   08/20/17 1558  BP: 108/60  Pulse: 94  Resp: 20  SpO2: 97%  PainSc: 0-No pain    LUNGS: decreased breath sounds CARDIAC: Cor RRR EXTREMITIES: Trace bilateral lower extremity edema SKIN: Skin is very thin/frail and bruises easily  NEURO: Alert and oriented x 4, forgetful, ambulatory   (Duration of visit and documentation 75 minutes)    [name], RN, BSN

## 2017-08-28 ENCOUNTER — Ambulatory Visit (INDEPENDENT_AMBULATORY_CARE_PROVIDER_SITE_OTHER): Payer: Medicare Other | Admitting: Internal Medicine

## 2017-08-28 ENCOUNTER — Encounter: Payer: Self-pay | Admitting: Internal Medicine

## 2017-08-28 ENCOUNTER — Other Ambulatory Visit (INDEPENDENT_AMBULATORY_CARE_PROVIDER_SITE_OTHER): Payer: Medicare Other

## 2017-08-28 VITALS — BP 158/78 | HR 98 | Ht 69.0 in | Wt 135.0 lb

## 2017-08-28 DIAGNOSIS — J449 Chronic obstructive pulmonary disease, unspecified: Secondary | ICD-10-CM

## 2017-08-28 DIAGNOSIS — R0609 Other forms of dyspnea: Secondary | ICD-10-CM

## 2017-08-28 DIAGNOSIS — J9612 Chronic respiratory failure with hypercapnia: Secondary | ICD-10-CM

## 2017-08-28 DIAGNOSIS — I200 Unstable angina: Secondary | ICD-10-CM

## 2017-08-28 DIAGNOSIS — J9611 Chronic respiratory failure with hypoxia: Secondary | ICD-10-CM | POA: Diagnosis not present

## 2017-08-28 DIAGNOSIS — R0600 Dyspnea, unspecified: Secondary | ICD-10-CM

## 2017-08-28 LAB — BASIC METABOLIC PANEL
BUN: 23 mg/dL (ref 6–23)
CO2: 36 mEq/L — ABNORMAL HIGH (ref 19–32)
Calcium: 9.9 mg/dL (ref 8.4–10.5)
Chloride: 97 mEq/L (ref 96–112)
Creatinine, Ser: 1.1 mg/dL (ref 0.40–1.50)
GFR: 67.45 mL/min (ref >60.00)
Glucose, Bld: 112 mg/dL — ABNORMAL HIGH (ref 70–99)
Potassium: 4.3 mEq/L (ref 3.5–5.1)
Sodium: 138 mEq/L (ref 135–145)

## 2017-08-28 LAB — BRAIN NATRIURETIC PEPTIDE: Pro B Natriuretic peptide (BNP): 127 pg/mL — ABNORMAL HIGH (ref 0.0–100.0)

## 2017-08-28 MED ORDER — ALPRAZOLAM 0.25 MG PO TABS: ORAL_TABLET | ORAL | 2 refills | Status: DC

## 2017-08-28 NOTE — Patient Instructions (Addendum)
Please remember to go to the lab department downstairs in the basement  for your tests - we will call you with the results when they are available.      No change in recommendations as reflected on med calendar except ok to add an extra xanax 0.25 mg daily if needed    See calendar for specific medication instructions and bring it back for each and every office visit for every healthcare provider you see.  Without it,  you may not receive the best quality medical care that we feel you deserve.  You will note that the calendar groups together  your maintenance  medications that are timed at particular times of the day.  Think of this as your checklist for what your doctor has instructed you to do until your next evaluation to see what benefit  there is  to staying on a consistent group of medications intended to keep you well.  The other group at the bottom is entirely up to you to use as you see fit  for specific symptoms that may arise between visits that require you to treat them on an as needed basis.  Think of this as your action plan or "what if" list.   Separating the top medications from the bottom group is fundamental to providing you adequate care going forward.

## 2017-08-28 NOTE — Progress Notes (Signed)
Subjective:     Patient ID: [name], male    DOB: [birth year]   MRN: [id number]  Brief patient profile:  65   yowm MM quit smoking in 1984 with GOLD IV/ 02 dep and steroid dep copd place on NIV by [name] with profound chronic FTT not improved on NIV      History of Present Illness  05/25/2017  f/u ov/[name] re:  [name] iv copd/ 02 dep  Chief Complaint  Patient presents with  . Follow-up    Increased SOB and  fatigue x last week, 2.5L pulsed, compliant with spiriva, symbicort, and xopenex.   Dyspnea:  At rest/ worse walking 10 ft  Cough: none Sleep: on trilogy ok SABA use:  Confused with saba hfa vs neb  rec Add aldactone 50 mg daily  Continue triegy at your volition Ok to adjust xanax (alprazolam) from one-half to one three times a day to calm your breathing down and relieve anxiety related to breathlessness     08/28/2017 acute extended ov/[name] re: GOLD IV copd/ cc sob/fatigue [name]  Chief Complaint  Patient presents with  . Acute Visit    For the last 5 days he has felt tired, and coughing. He start 20 mg of  pred. and Zpack   doe = MMRC4  = sob if tries to leave home or while getting dressed  Even on 02 titrated to keep sats > 90% Cough >  Min mucoid Sleeping on NIV flat comfortably   No obvious day to day or daytime variability or assoc excess/ purulent sputum or mucus plugs or hemoptysis or cp or chest tightness, subjective wheeze or overt sinus or hb symptoms.   Sleeping as above  without nocturnal  or early am exacerbation  of respiratory  c/o's or need for noct saba. Also denies any obvious fluctuation of symptoms with weather or environmental changes or other aggravating or alleviating factors except as outlined above   No unusual exposure hx or h/o childhood pna/ asthma or knowledge of premature birth.  Current Allergies, Complete Past Medical History, Past Surgical History, Family History, and Social History were reviewed in [employer]  record.  ROS  The following are not active complaints unless bolded Hoarseness, sore throat, dysphagia, dental problems, itching, sneezing,  nasal congestion or discharge of excess mucus or purulent secretions, ear ache,   fever, chills, sweats, unintended wt loss or wt gain, classically pleuritic or exertional cp,  orthopnea pnd or arm/hand swelling  or leg swelling, presyncope, palpitations, abdominal pain, anorexia, nausea, vomiting, diarrhea  or change in bowel habits or change in bladder habits, change in stools or change in urine, dysuria, hematuria,  rash, arthralgias, visual complaints, headache, numbness, weakness or ataxia or problems with walking or coordination,  change in mood or  memory.        Current Meds  Medication Sig  . ALPRAZolam (XANAX) 0.25 MG tablet Use up to 0.25 mg three times daily as needed  . aspirin 81 MG tablet Take 81 mg by mouth daily.  Marland Kitchen atorvastatin (LIPITOR) 20 MG tablet TAKE 1 TABLET BY MOUTH EVERY DAY  . azithromycin (ZITHROMAX) 250 MG tablet ZPAK take as directed for nasty mucus  . cetirizine (ZYRTEC) 10 MG tablet Take 10 mg by mouth daily as needed for allergies.  . Cholecalciferol (VITAMIN D3) 2000 units TABS Take 2,000 Units by mouth daily.  Marland Kitchen dextromethorphan-guaiFENesin (MUCINEX DM) 30-600 MG 12hr tablet Take 1-2 tablets by mouth every 12 (twelve)  hours as needed for cough (w/ flutter valve).  . finasteride (PROSCAR) 5 MG tablet Take 5 mg by mouth every morning.  . furosemide (LASIX) 20 MG tablet TAKE 1 TABLET BY MOUTH DAILY. CAN TAKE ADDITIONAL PILL IF NEEDED FOR SWELLING  . hydrALAZINE (APRESOLINE) 25 MG tablet Take 25 mg by mouth every 6 (six) hours as needed.  . levalbuterol (XOPENEX HFA) 45 MCG/ACT inhaler INHALE 2 PUFFS BY MOUTH EVERY 4 HOURS AS NEEDED FOR WHEEZING OR SHORTNESS OF BREATH  . levalbuterol (XOPENEX) 0.63 MG/3ML nebulizer solution USE ONE VIAL VIA NEBULIZER EVERY 4 HOURS AS NEEDED FOR WHEEZING OR SHORTNESS OF BREATH  . metoprolol  succinate (TOPROL-XL) 25 MG 24 hr tablet Take 0.5 tablets (12.5 mg total) by mouth daily.  . mometasone (NASONEX) 50 MCG/ACT nasal spray SHAKE LIQUID AND USE 2 SPRAYS IN EACH NOSTRIL DAILY  . Multiple Vitamins-Minerals (PRESERVISION AREDS 2) CAPS Take 1 capsule by mouth daily.  . nitroGLYCERIN (NITROSTAT) 0.4 MG SL tablet Place 0.4 mg under the tongue every 5 (five) minutes as needed for chest pain.  . OXYGEN Inhale 2 L continuous into the lungs.   . pantoprazole (PROTONIX) 40 MG tablet Take 40 mg by mouth daily.  . predniSONE (DELTASONE) 10 MG tablet Take 10 mg by mouth daily with breakfast. For flare of cough/wheezing may increase to 2 daily until better, then 1 daily  . ranitidine (ZANTAC) 150 MG tablet Take 150 mg by mouth at bedtime.   Marland Kitchen SPIRIVA RESPIMAT 2.5 MCG/ACT AERS Inhale 2 puffs into the lungs daily.  Marland Kitchen spironolactone (ALDACTONE) 25 MG tablet Take 0.5 tablets (12.5 mg total) by mouth daily.  . SYMBICORT 160-4.5 MCG/ACT inhaler INHALE 2 PUFFS INTO THE LUNGS EVERY MORNING AND ANOTHER 2 PUFFS ABOUT 12 HOURS LATER  . [DISCONTINUED] ALPRAZolam (XANAX) 0.25 MG tablet TAKE 1 TABLET BY MOUTH THREE TIMES DAILY. TAKE 1/2 TABLET TWICE DAILY                         Objective:   Physical Exam    wt 159 04/28/2010  > 10/14/2010 154  > 10/04/2011 150> 12/21/2011  150 > 149 01/29/2012 >154 04/05/2012 > 05/22/2012 148 > 09/30/2012  146 >>144> 10/15/2012 > 12/18/2012  145 > 148 02/14/2013 >148 03/14/2013 > 04/25/2013 148 > 06/19/2013 150 >>07/17/2013 > 08/27/2013 148 > 10/23/2013  148 >153 12/09/2013 > 02/26/2014  153 > 06/01/2014  152 > 157 06/15/2014 > 09/21/2014 146 > 10/19/2014 151 >>148 11/19/14 > 02/24/2015 148 > 06/30/2015    140 >  10/04/2015  136 > 12/20/2015   142 > 06/13/2016  132 > 09/13/2016   133 >  11/07/2016  139 > 01/11/2017   135 > 02/20/2017  135 >  03/22/2017  132 > 05/25/2017 134  > 06/27/2017  135 > 08/28/2017   135    amb thin frail wm nad   Vital signs reviewed - Note on arrival 02 sats  94% on 2lpm  pulsed       HEENT: nl dentition / oropharynx. Nl external ear canals without cough reflex -  Mild bilateral non-specific turbinate edema     NECK :  without JVD/Nodes/TM/ nl carotid upstrokes bilaterally   LUNGS: no acc muscle use,  Mod barrel  contour chest wall with bilateral  Distant bs s audible wheeze and  without cough on insp or exp maneuver and mod  Hyperresonant  to  percussion bilaterally     CV:  RRR  no s3 or murmur or increase in P2, and 1+ pitting both le's  ABD:  soft and nontender with pos mid insp Hoover's  in the supine position. No bruits or organomegaly appreciated, bowel sounds nl  MS:   Slow steady  gait/  ext warm without deformities, calf tenderness, cyanosis or clubbing No obvious joint restrictions   SKIN: warm and dry without lesions    NEURO:  alert, approp, nl sensorium with  no motor  deficits apparent - mild resting tremor s rigidity noted         I personally reviewed images and agree with radiology impression as follows:  CXR:    06/21/17  1. No acute cardiopulmonary disease. 2. Hyperinflated lungs and emphysema, compatible with the provided history of COPD. Stable minimal scarring at the costophrenic angles.   Labs ordered/ reviewed:      Chemistry      Component Value Date/Time   NA 138 08/28/2017 1658   NA 139 07/09/2017 1325   K 4.3 08/28/2017 1658   CL 97 08/28/2017 1658   CO2 36 (H) 08/28/2017 1658   BUN 23 08/28/2017 1658   BUN 15 07/09/2017 1325   CREATININE 1.10 08/28/2017 1658      Component Value Date/Time   CALCIUM 9.9 08/28/2017 1658   ALKPHOS 61 04/04/2017 0056   AST 27 04/04/2017 0056   ALT 20 04/04/2017 0056   BILITOT 0.8 04/04/2017 0056        Lab Results  Component Value Date   WBC 13.7 (H) 08/28/2017   HGB 13.9 08/28/2017   HCT 42.3 08/28/2017   MCV 92.4 08/28/2017   PLT 246.0 08/28/2017       EOS                                                               0.1                                     08/28/2017      Lab Results  Component Value Date   PROBNP 127.0 (H) 08/28/2017

## 2017-08-29 LAB — CBC WITH DIFFERENTIAL/PLATELET
Basophils Absolute: 0.1 10*3/uL (ref 0.0–0.1)
Basophils Relative: 0.4 % (ref 0.0–3.0)
Eosinophils Absolute: 0.1 10*3/uL (ref 0.0–0.7)
Eosinophils Relative: 0.4 % (ref 0.0–5.0)
HCT: 42.3 % (ref 39.0–52.0)
Hemoglobin: 13.9 g/dL (ref 13.0–17.0)
Lymphocytes Relative: 4 % — ABNORMAL LOW (ref 12.0–46.0)
Lymphs Abs: 0.5 10*3/uL — ABNORMAL LOW (ref 0.7–4.0)
MCHC: 32.9 g/dL (ref 30.0–36.0)
MCV: 92.4 fl (ref 78.0–100.0)
Monocytes Absolute: 1 10*3/uL (ref 0.1–1.0)
Monocytes Relative: 7.3 % (ref 3.0–12.0)
Neutro Abs: 12.1 10*3/uL — ABNORMAL HIGH (ref 1.4–7.7)
Neutrophils Relative %: 87.9 % — ABNORMAL HIGH (ref 43.0–77.0)
Platelets: 246 10*3/uL (ref 150.0–400.0)
RBC: 4.58 Mil/uL (ref 4.22–5.81)
RDW: 13.4 % (ref 11.5–15.5)
WBC: 13.7 10*3/uL — ABNORMAL HIGH (ref 4.0–10.5)

## 2017-08-30 ENCOUNTER — Ambulatory Visit (INDEPENDENT_AMBULATORY_CARE_PROVIDER_SITE_OTHER): Payer: Medicare Other | Admitting: Cardiology

## 2017-08-30 ENCOUNTER — Encounter: Payer: Self-pay | Admitting: Cardiology

## 2017-08-30 VITALS — BP 110/58 | HR 88 | Ht 69.0 in | Wt 130.0 lb

## 2017-08-30 DIAGNOSIS — I200 Unstable angina: Secondary | ICD-10-CM | POA: Diagnosis not present

## 2017-08-30 DIAGNOSIS — R079 Chest pain, unspecified: Secondary | ICD-10-CM | POA: Diagnosis not present

## 2017-08-30 DIAGNOSIS — R251 Tremor, unspecified: Secondary | ICD-10-CM

## 2017-08-30 DIAGNOSIS — J449 Chronic obstructive pulmonary disease, unspecified: Secondary | ICD-10-CM | POA: Diagnosis not present

## 2017-08-30 DIAGNOSIS — I25709 Atherosclerosis of coronary artery bypass graft(s), unspecified, with unspecified angina pectoris: Secondary | ICD-10-CM | POA: Diagnosis not present

## 2017-08-30 DIAGNOSIS — R000 Tachycardia, unspecified: Secondary | ICD-10-CM

## 2017-08-30 DIAGNOSIS — E782 Mixed hyperlipidemia: Secondary | ICD-10-CM | POA: Diagnosis not present

## 2017-08-30 DIAGNOSIS — I10 Essential (primary) hypertension: Secondary | ICD-10-CM | POA: Diagnosis not present

## 2017-08-30 DIAGNOSIS — R531 Weakness: Secondary | ICD-10-CM

## 2017-08-30 MED ORDER — METOPROLOL SUCCINATE ER 25 MG PO TB24: ORAL_TABLET | ORAL | 3 refills | Status: DC

## 2017-08-30 NOTE — Patient Instructions (Addendum)
Medication Instructions:  1. CHANGE TAKING THE METOPROLOL 12.5 MG DAILY AT 12 NOON TIME  Labwork: NONE ORDERED TODAY  Testing/Procedures: NONE ORDERED TODAY  Follow-Up: DR. Tamala Julian ON 10/03/17  YOU ARE BEING REFERRED TO [facility] FOR TREMORS; THEIR OFFICE WILL CALL YOU WITH AN A APPT   Any Other Special Instructions Will Be Listed Below (If Applicable).     If you need a refill on your cardiac medications before your next appointment, please call your pharmacy.

## 2017-08-30 NOTE — Assessment & Plan Note (Addendum)
- PFTs  05/04/05 FEV1 36% ratio 28% with 29% improvement after bronchodilators DLCO 48%  - PFT's 03/26/08 30% ratio 34 with 14% improvement after bronchodilators DLC0 38 %  - Referred to rehab 12/19/2012 > completed March 2015  - changed to spiriva respimat 02/14/2013  - started daily prednisone 06/19/13 > improved only a little> tapered off mid July 2015 > ok to change to prn prednisone 08/27/2013  - flared off nasonex and gerd rx 09/21/2014 > resumed - 10/19/2014  p extensive coaching HFA effectiveness =    90%  - changed pred to ceiling of 20 / floor of 5 mg daily as of 10/19/2014 > changed to floor of 10 mg daily 10/04/2015  - PFT's  06/08/2015  FEV1 0.58 (22 % ) ratio 27  p No sign % improvement from saba p symb/spiriva prior to study with DLCO  9/9 % corrects to 27 % for alv volume   - changed pred to ceiling of 20 and floor of 5 mg daily 06/13/16 > increased floor to 10 mg daily effective 02/20/2017  - referred back to rehab 09/13/2016  - try off noct vent and start titrating up xanax 01/11/2017 > no change but then admitted with flu and placed back on vent  - alpha one AT 03/01/17  MM     - 08/28/2017  After extensive coaching inhaler device,  effectiveness =    75% (short Ti)   DDX of  difficult airways management almost all start with A and  include Adherence, Ace Inhibitors, Acid Reflux, Active Sinus Disease, Alpha 1 Antitripsin deficiency, Anxiety masquerading as Airways dz,  ABPA,  Allergy(esp in young), Aspiration (esp in elderly), Adverse effects of meds,  Active smokers, A bunch of PE's (a small clot burden can't cause this syndrome unless there is already severe underlying pulm or vascular dz with poor reserve) plus two Bs  = Bronchiectasis and Beta blocker use..and one C= CHF   Adherence is always the initial "prime suspect" and is a multilayered concern that requires a "trust but verify" approach in every patient - starting with knowing how to use medications, especially inhalers, correctly,  keeping up with refills and understanding the fundamental difference between maintenance and prns vs those medications only taken for a very short course and then stopped and not refilled.  - using med calendar  - see hfa training   ? Acid (or non-acid) GERD > always difficult to exclude as up to 75% of pts in some series report no assoc GI/ Heartburn symptoms> rec max (24h)  acid suppression and diet restrictions/ reviewed and instructions given in writing.   ? Allergy/ asthma > The goal with a chronic steroid dependent illness is always arriving at the lowest effective dose that controls the disease/symptoms and not accepting a set "formula" which is based on statistics or guidelines that don't always take into account patient  variability or the natural hx of the dz in every individual patient, which may well vary over time.  For now therefore I recommend the patient maintain  20 mg as ceiling and work back to floor of 10 mg daily   ? Anxiety > usually at the bottom of this list of usual suspects but should be much higher on this pt's based on H and P and note already on psychotropics and may interfere with adherence and also interpretation of response or lack thereof to symptom management which can be quite subjective.   ? Anemia > ruled out today,  tsh ok 03/08/17     ? BB effects > unlikely on such low doses of toprol  ? chf > echo 03/09/17 reviewed, showed mild decrease in RV function and grade 1 diastolic dysfunction but nl LAE and  BNP min elevated so doubt contributing to sob    I had an extended discussion with the patient reviewing all relevant studies completed to date and  lasting 25 minutes of a 40  minute acute office  visit addressing    severe non-specific but potentially very serious refractory respiratory symptoms of uncertain and potentially multiple  Etiologies.  Really nothing else to offer here but palliative approach >  Increase xanax as needed.  See device teaching which  extended face to face time for this visit    Each maintenance medication was reviewed in detail including most importantly the difference between maintenance and prns and under what circumstances the prns are to be triggered using an action plan format that is not reflected in the computer generated alphabetically organized AVS.    Please see AVS for specific instructions unique to this office visit that I personally wrote and verbalized to the the pt in detail and then reviewed with pt  by my nurse highlighting any changes in therapy/plan of care  recommended at today's visit.

## 2017-08-30 NOTE — Progress Notes (Signed)
Cardiology Office Note   Date:  08/30/2017   ID:  [name], DOB Oct 10, 1929, MRN [id number]  PCP:  [name], MD  Cardiologist:  Dr. Tamala Julian     Chief Complaint  Patient presents with  . Coronary Artery Disease      History of Present Illness: [name] is a 84 y.o. male who presents for  CAD and edema.  He has a hx of CABG2003, HTN, COPD on steroids w/ chronic resp failure,cor pulmonale on 2 L oxygen and nocturnal ventilation, uses trilogy machine,hyperlipidemia, PAD, history of carotid obstructive disease andRBBB  Has had several hospitalizations most recent in March twice once with COPD flare.  BB reduced due to hypotension.  For chest pain ranexa added.  Later he had weakness and tremors and Ranexa was stopped.  Pt with chronic Lower ext edema, on last visit 04/12/17 he had chest burning.  It was unclear if this was cardiac or GI, but with discussion with Dr. Tamala Julian pt is not a candidate for any invasive eval given end stage lung disease.  On PPI.  Hx of orthostatic hypotension.  Did not tolerate spironolactone but was at higher dose.     Pt on last visit with chest pain, encouraged to try NTG.  Aldactone 12.5 mg/day added to his current meds. On last visit.  Recent labs with K+ 4.3 and Cr 1.10 On palliative care per Dr. Harrington Challenger.   Saw Dr. [name] on 08/28/17 leg pitting at 1+ he has home oxygen. 24/7.    His lower ext edema is improved.  He had been feeling well but in last week more weak and fatigued. His appetite is good.  He saw Dr. [name] on the 20th and his BP was 158/78 today 110/58.   He has had 2 episodes this week of standing after Breakfast and became lightheaded. One episode before he saw Dr. [name].  We discussed and will have him take toprol at noon instead of in AM   - he is agreeable.  He has noted increased HR to 100 and he does have PACs on EKG, this is most ikely to lung disease.  Reassured but if HR is much higher and does not slow down with deep breathing  then he could take an extra half toprol if his BP is stable. > 099 systolic.   He is concerned about his tremor, has been ongoing since March after more severe episode.  At that time thought to be due to orthostatic hypotension and deconditioning.  Also with Asterixis which he no longer has.  It was thought hypercarb caused this.   Now trembling in both hands and at times has trouble dressing - buttoning his shirt --he would like to see neurologist.   No chest pain.  No syncope though 2 episodes of lightheadedness, this is first occurrence in some time.    Past Medical History:  Diagnosis Date  . Arthritis    "generalized" (04/04/2017)  . Asthma   . Basal cell carcinoma    "left ear"  . BPH (benign prostatic hyperplasia)    severe; s/p multiple biopsies  . CAD (coronary artery disease)   . Carotid artery occlusion   . Chronic respiratory failure ([facility])   . Chronic rhinitis   . COPD (chronic obstructive pulmonary disease) ([facility])    2L [place] O2  . Diastolic heart failure ([place]) 2019  . Elevated troponin 03/09/2017  . GERD (gastroesophageal reflux disease)   . History of blood transfusion    "  w/his CABG" (04/04/2017)  . History of kidney stones   . Hyperlipidemia   . Hypertension   . On home oxygen therapy    "~ 24/7" (04/04/2017)  . Peripheral vascular disease ([place])   . Pneumonia 2019  . Syncope and collapse   . Ureteral tumor 08/2015   had endoscopic procedure for evaluation, unable to reach for biopsy    Past Surgical History:  Procedure Laterality Date  . BASAL CELL CARCINOMA EXCISION Left    ear  . CARDIAC CATHETERIZATION     "prior to bypass"  . CATARACT EXTRACTION W/ INTRAOCULAR LENS  IMPLANT, BILATERAL Bilateral   . CORONARY ARTERY BYPASS GRAFT  10/2001   LIMA to LAD, SVG to OM1-2, SVG to RCA and PDA  . LITHOTRIPSY    . PROSTATE BIOPSY  Oct. 2014  . TONSILLECTOMY       Current Outpatient Medications  Medication Sig Dispense Refill  . ALPRAZolam (XANAX) 0.25 MG tablet  Use up to 0.25 mg three times daily as needed 90 tablet 2  . aspirin 81 MG tablet Take 81 mg by mouth daily.    Marland Kitchen atorvastatin (LIPITOR) 20 MG tablet TAKE 1 TABLET BY MOUTH EVERY DAY 90 tablet 2  . azithromycin (ZITHROMAX) 250 MG tablet ZPAK take as directed for nasty mucus    . Cholecalciferol (VITAMIN D3) 2000 units TABS Take 2,000 Units by mouth daily.    Marland Kitchen dextromethorphan-guaiFENesin (MUCINEX DM) 30-600 MG 12hr tablet Take 1-2 tablets by mouth every 12 (twelve) hours as needed for cough (w/ flutter valve).    . finasteride (PROSCAR) 5 MG tablet Take 5 mg by mouth every morning.    . furosemide (LASIX) 20 MG tablet TAKE 1 TABLET BY MOUTH DAILY. CAN TAKE ADDITIONAL PILL IF NEEDED FOR SWELLING 45 tablet 2  . hydrALAZINE (APRESOLINE) 25 MG tablet Take 25 mg by mouth every 6 (six) hours as needed.    . levalbuterol (XOPENEX HFA) 45 MCG/ACT inhaler INHALE 2 PUFFS BY MOUTH EVERY 4 HOURS AS NEEDED FOR WHEEZING OR SHORTNESS OF BREATH 15 g 3  . levalbuterol (XOPENEX) 0.63 MG/3ML nebulizer solution USE ONE VIAL VIA NEBULIZER EVERY 4 HOURS AS NEEDED FOR WHEEZING OR SHORTNESS OF BREATH 300 mL 3  . metoprolol succinate (TOPROL-XL) 25 MG 24 hr tablet TAKE AT 12 NOON TIME 45 tablet 3  . mometasone (NASONEX) 50 MCG/ACT nasal spray SHAKE LIQUID AND USE 2 SPRAYS IN EACH NOSTRIL DAILY 17 g 11  . Multiple Vitamins-Minerals (PRESERVISION AREDS 2) CAPS Take 1 capsule by mouth daily.    . nitroGLYCERIN (NITROSTAT) 0.4 MG SL tablet Place 0.4 mg under the tongue every 5 (five) minutes as needed for chest pain.    . OXYGEN Inhale 2 L continuous into the lungs.     . pantoprazole (PROTONIX) 40 MG tablet Take 40 mg by mouth daily.    . predniSONE (DELTASONE) 10 MG tablet Take 10 mg by mouth daily with breakfast. For flare of cough/wheezing may increase to 2 daily until better, then 1 daily    . ranitidine (ZANTAC) 150 MG tablet Take 150 mg by mouth at bedtime.   0  . SPIRIVA RESPIMAT 2.5 MCG/ACT AERS Inhale 2 puffs into  the lungs daily.  11  . spironolactone (ALDACTONE) 25 MG tablet Take 0.5 tablets (12.5 mg total) by mouth daily. 45 tablet 3  . SYMBICORT 160-4.5 MCG/ACT inhaler INHALE 2 PUFFS INTO THE LUNGS EVERY MORNING AND ANOTHER 2 PUFFS ABOUT 12 HOURS LATER 10.2 g 11  No current facility-administered medications for this visit.     Allergies:   Cefdinir; Nitrofurantoin; Sulfa antibiotics; Sulfonamide derivatives; Cefdinir; Ciprofloxacin; Nitrofurantoin; Ciprofloxacin; Levaquin [levofloxacin]; and Levaquin [levofloxacin]    Social History:  The patient  reports that he quit smoking about 35 years ago. His smoking use included cigarettes. He has a 66.00 pack-year smoking history. He has never used smokeless tobacco. He reports that he drinks alcohol. He reports that he does not use drugs.   Family History:  The patient's family history includes Allergies in his brother; Breast cancer in his mother; Cancer in his mother; Heart attack in his father; Heart disease in his brother, brother, father, and mother; Hyperlipidemia in his brother; Hypertension in his brother and mother; Other in his mother; Peripheral vascular disease in his brother; Stroke in his brother.    ROS:  General:no colds or fevers, no weight changes Skin:no rashes or ulcers HEENT:no blurred vision, no congestion CV:see HPI PUL:see HPI GI:no diarrhea constipation or melena, no indigestion GU:no hematuria, no dysuria MS:no joint pain, no claudication Neuro:no syncope, +lightheadedness, + tremor of hands Endo:no diabetes, no thyroid disease  Wt Readings from Last 3 Encounters:  08/30/17 130 lb (59 kg)  08/28/17 135 lb (61.2 kg)  08/07/17 129 lb 12.8 oz (58.9 kg)     PHYSICAL EXAM: VS:  BP (!) 110/58   Pulse 88   Ht 5\' 9"  (1.753 m)   Wt 130 lb (59 kg)   SpO2 97%   BMI 19.20 kg/m  , BMI Body mass index is 19.2 kg/m. General:Pleasant affect, NAD, frail with oxygen in place.  Skin:Warm and dry, brisk capillary  refill HEENT:normocephalic, sclera clear, mucus membranes moist Neck:supple, no JVD, no bruits  Heart:S1S2 RRR without murmur, gallup, rub or click Lungs:diminished throughout  without rales, rhonchi, or wheezes ZDG:UYQI, non tender, + BS, do not palpate liver spleen or masses Ext:no lower ext edema on Rt 1+ on lt.,  2+ radial pulses Neuro:alert and oriented X 3, MAE, follows commands, + facial symmetry    EKG:  EKG is ordered today. The ekg ordered today demonstrates SR  At 88 with occ PAC and RBBB no acute changes.   Recent Labs: 03/08/2017: TSH 1.851 03/24/2017: B Natriuretic Peptide 190.3 04/04/2017: ALT 20 06/01/2017: Magnesium 2.4 08/28/2017: BUN 23; Creatinine, Ser 1.10; Hemoglobin 13.9; Platelets 246.0; Potassium 4.3; Pro B Natriuretic peptide (BNP) 127.0; Sodium 138    Lipid Panel    Component Value Date/Time   CHOL 146 03/26/2017 0621   TRIG 34 03/26/2017 0621   HDL 79 03/26/2017 0621   CHOLHDL 1.8 03/26/2017 0621   VLDL 7 03/26/2017 0621   LDLCALC 60 03/26/2017 0621       Other studies Reviewed: Additional studies/ records that were reviewed today include:Marland Kitchen  Monitor 06/2017 SR and ST PACs and PVCs.  No a fib and no sustained tachycardia  Echo 03/09/17  Study Conclusions  - Left ventricle: The cavity size was normal. Systolic function was   normal. The estimated ejection fraction was in the range of 60%   to 65%. Wall motion was normal; there were no regional wall   motion abnormalities. Doppler parameters are consistent with   abnormal left ventricular relaxation (grade 1 diastolic   dysfunction). - Aortic valve: Transvalvular velocity was within the normal range.   There was no stenosis. There was no regurgitation. - Mitral valve: Transvalvular velocity was within the normal range.   There was no evidence for stenosis. There was trivial  regurgitation. - Right ventricle: The cavity size was normal. Wall thickness was   normal. Systolic function was  reduced. - Tricuspid valve: There was mild regurgitation. - Pulmonary arteries: Systolic pressure was within the normal   range. PA peak pressure: 24 mm Hg (S). - Pericardium, extracardiac: A trivial pericardial effusion was   identified.  ABI  Final Interpretation: Right: Resting right ankle-brachial index indicates noncompressible right lower extremity arteries. The right toe-brachial index is abnormal. RT great toe pressure = 37 mmHg.  Left: Resting left ankle-brachial index indicates mild left lower extremity arterial disease. The left toe-brachial index is abnormal. LT Great toe pressure = 44 mmHg.  ASSESSMENT AND PLAN:  1.  Weakness with lower BP, wt is similar to recent visits.  Will change time on toprol to noon instead of  AM, if continues would stop lasix or use PRN only.  Continue aldactone.    2.   Tachycardia brief and no a fib reassured - but if HR stays elevated to 110 he could try extra dose of 12.5 of toprol XL if BP > 062 systolic.  3.    CAD occ chest pain , no EKG changes, he tried NTG once could not tell if really helped.  He realizes some may be from work of breathing.   4.    Tremor of hands.  I will refer to neuro, he is concerned that it will increase and he would like to maintain independence  5.     COPD per Dr. [name].  On oxygen 24/7 --he is on palliative care.   6.     HLD on statin lipids stable  7.     Lower ext edema much improved with spironolactone.    8.      HTN  Lower today was elevated on the 20th, monitor.  He will keep follow up with Dr. Tamala Julian in Sept.   Current medicines are reviewed with the patient today.  The patient Has no concerns regarding medicines.  The following changes have been made:  See above Labs/ tests ordered today include:see above  Disposition:   FU:  see above  Signed, Cecilie Kicks, NP  08/30/2017 4:43 PM    [facility], [place], [place], [place] Phone: [phone]; Fax: [phone]

## 2017-08-31 ENCOUNTER — Encounter: Payer: Self-pay | Admitting: Internal Medicine

## 2017-08-31 NOTE — Assessment & Plan Note (Signed)
-   Start noct 02 at 2lpm 2008 and on 03/04/09 desat @ > 185 ft so rec wear with activtiy > rm to rm  - 02/15/2011   Walked RA x one lap @ 185 stopped due to  desat > corrected on 2lpm - 02/26/2014  Walked 2lpm  2 laps @ 185 ft each stopped due to  Sob/ sats 88% at nl pace  - 10/19/2014   Walked RA  2 laps @ 185 ft each stopped due to  End of study, slow pace,  sob  But no  desat   05/21/15 Eval by Dr Dedio/ rec trial of NIV  - HCO3  10/27/15 = 34  - HC03  10/06/16  = 28 ? On noct vent (non compliant) - As of admit  02/04/17 back on noct vent/ 02 2lpm 24/7  - HC03 36  08/28/2017 despite reporting using noct vent >  This is def a sign of endstage dz and nothing else to offer

## 2017-08-31 NOTE — Assessment & Plan Note (Signed)
Labs reviewed,  Nothing new to offer. Suffering from deconditioning as well   Consider palliative care/ hospice sooner rather than later

## 2017-09-03 DIAGNOSIS — F322 Major depressive disorder, single episode, severe without psychotic features: Secondary | ICD-10-CM | POA: Diagnosis not present

## 2017-09-06 ENCOUNTER — Encounter: Payer: Self-pay | Admitting: Neurology

## 2017-09-06 DIAGNOSIS — H18831 Recurrent erosion of cornea, right eye: Secondary | ICD-10-CM | POA: Diagnosis not present

## 2017-09-06 NOTE — Progress Notes (Signed)
I got a fax from Ms. Cecilie Kicks in Cardiology for a referral of this pt to see neurology for tremor (possible orthostatic tremor) follow up discharged in 03/2017. Since I am not doing outpt anymore, I would refer this pt to [facility] for follow up. I looked at his chart and found that Dr. Rexene Alberts has been seeing him since 2014 with last visit in 10/2012. She will be the best provider in [facility] to see this pt. Katrina, could you please let Dr. Guadelupe Sabin nurse to make appointment with Dr. Rexene Alberts? Thanks for your help.  Rosalin Hawking, MD PhD Stroke Neurology 09/06/2017 6:20 PM

## 2017-09-07 ENCOUNTER — Telehealth: Payer: Self-pay | Admitting: Neurology

## 2017-09-07 NOTE — Telephone Encounter (Signed)
Yes, okay to switch.

## 2017-09-07 NOTE — Telephone Encounter (Signed)
Okay with me, I will be happy to see the patient.

## 2017-09-07 NOTE — Telephone Encounter (Signed)
Dr Rexene Alberts last saw this patient in 2014. He is being referred back and would like to see another doctor. Would you be willing to see this patient? dg

## 2017-09-07 NOTE — Progress Notes (Signed)
[name] in referrals notified of this message. Message was sent to her.

## 2017-09-07 NOTE — Telephone Encounter (Signed)
Patient last saw you 2014. He is now being referred back for tremor he would like to see another doctor. Is it ok to switch? dg

## 2017-09-07 NOTE — Progress Notes (Signed)
Please make pt appt with Dr. Rexene Alberts for neurology for tremor.  thanks

## 2017-09-07 NOTE — Progress Notes (Signed)
Message sent to new pt referral coordinators. Pt last seen 2014. Referral is needed.

## 2017-09-13 ENCOUNTER — Encounter: Payer: Self-pay | Admitting: Internal Medicine

## 2017-09-13 ENCOUNTER — Ambulatory Visit (INDEPENDENT_AMBULATORY_CARE_PROVIDER_SITE_OTHER): Payer: Medicare Other | Admitting: Internal Medicine

## 2017-09-13 VITALS — BP 130/60 | HR 96 | Temp 97.8°F | Ht 69.0 in | Wt 128.0 lb

## 2017-09-13 DIAGNOSIS — J310 Chronic rhinitis: Secondary | ICD-10-CM | POA: Diagnosis not present

## 2017-09-13 DIAGNOSIS — J9611 Chronic respiratory failure with hypoxia: Secondary | ICD-10-CM | POA: Diagnosis not present

## 2017-09-13 DIAGNOSIS — I200 Unstable angina: Secondary | ICD-10-CM

## 2017-09-13 DIAGNOSIS — J9612 Chronic respiratory failure with hypercapnia: Secondary | ICD-10-CM | POA: Diagnosis not present

## 2017-09-13 DIAGNOSIS — I2781 Cor pulmonale (chronic): Secondary | ICD-10-CM

## 2017-09-13 DIAGNOSIS — J449 Chronic obstructive pulmonary disease, unspecified: Secondary | ICD-10-CM | POA: Diagnosis not present

## 2017-09-13 MED ORDER — PREDNISONE 10 MG PO TABS: ORAL_TABLET | ORAL | 2 refills | Status: DC

## 2017-09-13 NOTE — Progress Notes (Signed)
Subjective:     Patient ID: [name], male    DOB: 1929/03/04   MRN: [id number]   Brief patient profile:  83   yowm MM quit smoking in 1984 with GOLD IV/ 02 dep and steroid dep copd place on NIV by Dr Larkin Ina with profound chronic FTT not improved on NIV      History of Present Illness  05/25/2017  f/u ov/[name] re:  [name] iv copd/ 02 dep  Chief Complaint  Patient presents with  . Follow-up    Increased SOB and  fatigue x last week, 2.5L pulsed, compliant with spiriva, symbicort, and xopenex.   Dyspnea:  At rest/ worse walking 10 ft  Cough: none Sleep: on trilogy ok SABA use:  Confused with saba hfa vs neb  rec Add aldactone 50 mg daily  Continue triegy at your volition Ok to adjust xanax (alprazolam) from one-half to one three times a day to calm your breathing down and relieve anxiety related to breathlessness     08/28/2017 acute extended ov/[name] re: GOLD IV copd/ cc sob/fatigue [name]  Chief Complaint  Patient presents with  . Acute Visit    For the last 5 days he has felt tired, and coughing. He start 20 mg of  pred. and Zpack   doe = MMRC4  = sob if tries to leave home or while getting dressed  Even on 02 titrated to keep sats > 90% Cough >  Min mucoid Sleeping on NIV flat comfortably rec No change medications    09/13/2017 acute extended ov/[name] re: ? Aecopd/ new nasal complaints/ chronic resp failure ? niv dep  Chief Complaint  Patient presents with  . Acute Visit    green nasal d/c x 1 day.  Prednisone 10 mg one daily is floor  No change doe / 02 req and no purulent chest secretions  Still sleeping on vent/ not sure it's helping at all at this point    No obvious day to day or daytime variability or assoc excess/ purulent sputum or mucus plugs or hemoptysis or cp or chest tightness, subjective wheeze or overt sinus or hb symptoms.   Sleeping as above without nocturnal  or early am exacerbation  of respiratory  c/o's or need for noct saba. Also denies any  obvious fluctuation of symptoms with weather or environmental changes or other aggravating or alleviating factors except as outlined above   No unusual exposure hx or h/o childhood pna/ asthma or knowledge of premature birth.  Current Allergies, Complete Past Medical History, Past Surgical History, Family History, and Social History were reviewed in [employer] record.  ROS  The following are not active complaints unless bolded Hoarseness, sore throat, dysphagia, dental problems, itching, sneezing,  nasal congestion or discharge of excess mucus or purulent secretions, ear ache,   fever, chills, sweats, unintended wt loss or wt gain, classically pleuritic or exertional cp,  orthopnea pnd or arm/hand swelling  or leg swelling, presyncope, palpitations, abdominal pain, anorexia, nausea, vomiting, diarrhea  or change in bowel habits or change in bladder habits, change in stools or change in urine, dysuria, hematuria,  rash, arthralgias, visual complaints, headache, numbness, weakness or ataxia or problems with walking or coordination,  change in mood= anxious or  memory.        Current Meds  Medication Sig  . ALPRAZolam (XANAX) 0.25 MG tablet Use up to 0.25 mg three times daily as needed  . aspirin 81 MG tablet Take 81 mg  by mouth daily.  Marland Kitchen atorvastatin (LIPITOR) 20 MG tablet TAKE 1 TABLET BY MOUTH EVERY DAY  . azithromycin (ZITHROMAX) 250 MG tablet ZPAK take as directed for nasty mucus  . Cholecalciferol (VITAMIN D3) 2000 units TABS Take 2,000 Units by mouth daily.  Marland Kitchen dextromethorphan-guaiFENesin (MUCINEX DM) 30-600 MG 12hr tablet Take 1-2 tablets by mouth every 12 (twelve) hours as needed for cough (w/ flutter valve).  . finasteride (PROSCAR) 5 MG tablet Take 5 mg by mouth every morning.  . furosemide (LASIX) 20 MG tablet TAKE 1 TABLET BY MOUTH DAILY. CAN TAKE ADDITIONAL PILL IF NEEDED FOR SWELLING  . hydrALAZINE (APRESOLINE) 25 MG tablet Take 25 mg by mouth every 6 (six)  hours as needed.  . levalbuterol (XOPENEX HFA) 45 MCG/ACT inhaler INHALE 2 PUFFS BY MOUTH EVERY 4 HOURS AS NEEDED FOR WHEEZING OR SHORTNESS OF BREATH  . levalbuterol (XOPENEX) 0.63 MG/3ML nebulizer solution USE ONE VIAL VIA NEBULIZER EVERY 4 HOURS AS NEEDED FOR WHEEZING OR SHORTNESS OF BREATH  . metoprolol succinate (TOPROL-XL) 25 MG 24 hr tablet TAKE AT 12 NOON TIME  . mometasone (NASONEX) 50 MCG/ACT nasal spray SHAKE LIQUID AND USE 2 SPRAYS IN EACH NOSTRIL DAILY  . Multiple Vitamins-Minerals (PRESERVISION AREDS 2) CAPS Take 1 capsule by mouth daily.  . nitroGLYCERIN (NITROSTAT) 0.4 MG SL tablet Place 0.4 mg under the tongue every 5 (five) minutes as needed for chest pain.  . OXYGEN Inhale 2 L continuous into the lungs.   . pantoprazole (PROTONIX) 40 MG tablet Take 40 mg by mouth daily.  . predniSONE (DELTASONE) 10 MG tablet For flare of cough/wheezing may increase to 2 daily until better, then 1 daily  . ranitidine (ZANTAC) 150 MG tablet Take 150 mg by mouth at bedtime.   Marland Kitchen SPIRIVA RESPIMAT 2.5 MCG/ACT AERS Inhale 2 puffs into the lungs daily.  Marland Kitchen spironolactone (ALDACTONE) 25 MG tablet Take 0.5 tablets (12.5 mg total) by mouth daily.  . SYMBICORT 160-4.5 MCG/ACT inhaler INHALE 2 PUFFS INTO THE LUNGS EVERY MORNING AND ANOTHER 2 PUFFS ABOUT 12 HOURS LATER  .                      Objective:   Physical Exam    wt 159 04/28/2010  > 10/14/2010 154  > 10/04/2011 150> 12/21/2011  150 > 149 01/29/2012 >154 04/05/2012 > 05/22/2012 148 > 09/30/2012  146 >>144> 10/15/2012 > 12/18/2012  145 > 148 02/14/2013 >148 03/14/2013 > 04/25/2013 148 > 06/19/2013 150 >>07/17/2013 > 08/27/2013 148 > 10/23/2013  148 >153 12/09/2013 > 02/26/2014  153 > 06/01/2014  152 > 157 06/15/2014 > 09/21/2014 146 > 10/19/2014 151 >>148 11/19/14 > 02/24/2015 148 > 06/30/2015    140 >  10/04/2015  136 > 12/20/2015   142 > 06/13/2016  132 > 09/13/2016   133 >  11/07/2016  139 > 01/11/2017   135 > 02/20/2017  135 >  03/22/2017  132 > 05/25/2017 134  > 06/27/2017   135 > 08/28/2017   135 > 09/13/2017   128    w/c bound elderly wm nad      Vital signs reviewed - Note on arrival 02 sats  92% on 2lpm poc        HEENT: nl dentition / oropharynx. Nl external ear canals without cough reflex -  Mod/severe bilateral non-specific turbinate edema     NECK :  without JVD/Nodes/TM/ nl carotid upstrokes bilaterally   LUNGS: no acc muscle use,  Mod barrel  contour chest wall with bilateral  Distant bs s audible wheeze and  without cough on insp or exp maneuver and mod  Hyperresonant  to  percussion bilaterally     CV:  RRR  no s3 or murmur or increase in P2, and no edema   ABD:  soft and nontender with pos mid  insp Hoover's  in the supine position. No bruits or organomegaly appreciated, bowel sounds nl  MS:   Nl gait/  ext warm without deformities, calf tenderness, cyanosis or clubbing No obvious joint restrictions   SKIN: warm and dry without lesions    NEURO:  alert, approp, nl sensorium with  no motor or cerebellar deficits apparent.

## 2017-09-13 NOTE — Telephone Encounter (Signed)
Error

## 2017-09-13 NOTE — Patient Instructions (Signed)
Go ahead and take zpak as per med calendar and call if mucus not turning back to clear and we call you in augmentin x 10 days   Change the follow up appt to 6 weeks - call sooner if needed

## 2017-09-15 ENCOUNTER — Encounter: Payer: Self-pay | Admitting: Internal Medicine

## 2017-09-15 NOTE — Assessment & Plan Note (Signed)
-   PFTs  05/04/05 FEV1 36% ratio 28% with 29% improvement after bronchodilators DLCO 48%  - PFT's 03/26/08 30% ratio 34 with 14% improvement after bronchodilators DLC0 38 %  - Referred to rehab 12/19/2012 > completed March 2015  - changed to spiriva respimat 02/14/2013  - started daily prednisone 06/19/13 > improved only a little> tapered off mid July 2015 > ok to change to prn prednisone 08/27/2013  - flared off nasonex and gerd rx 09/21/2014 > resumed - 10/19/2014  p extensive coaching HFA effectiveness =    90%  - changed pred to ceiling of 20 / floor of 5 mg daily as of 10/19/2014 > changed to floor of 10 mg daily 10/04/2015  - PFT's  06/08/2015  FEV1 0.58 (22 % ) ratio 27  p No sign % improvement from saba p symb/spiriva prior to study with DLCO  9/9 % corrects to 27 % for alv volume   - changed pred to ceiling of 20 and floor of 5 mg daily 06/13/16 > increased floor to 10 mg daily effective 02/20/2017  - referred back to rehab 09/13/2016  - try off noct vent and start titrating up xanax 01/11/2017 > no change but then admitted with flu and placed back on vent  - alpha one AT 03/01/17  MM    - 08/28/2017  After extensive coaching inhaler device,  effectiveness =    75% (short Ti)   Despite purulent rhinitis no evidence of aecopd> no change rx

## 2017-09-15 NOTE — Assessment & Plan Note (Signed)
-   Start noct 02 at 2lpm 2008 and on 03/04/09 desat @ > 185 ft so rec wear with activtiy > rm to rm  - 02/15/2011   Walked RA x one lap @ 185 stopped due to  desat > corrected on 2lpm - 02/26/2014  Walked 2lpm  2 laps @ 185 ft each stopped due to  Sob/ sats 88% at nl pace  - 10/19/2014   Walked RA  2 laps @ 185 ft each stopped due to  End of study, slow pace,  sob  But no  desat   05/21/15 Eval by Dr Dedio/ rec trial of NIV  - HCO3  10/27/15 = 34  - HC03  10/06/16  = 28 ? On noct vent (non compliant) - As of admit  02/04/17 back on noct vent/ 02 2lpm 24/7 - HC03 36  08/28/2017 despite reporting using noct vent   Again not sure the NIV is helping > ok to try on vs off to see what difference if any this makes

## 2017-09-15 NOTE — Assessment & Plan Note (Addendum)
Sinus CT 03/03/10 chronic changes only  - rec rechallenge with qnasal 12/21/11  - Trial of 1st gen h1 and hs h2 rec 05/23/2012  - Allergy profile 12/03/2012-negative-IgE 94.8     Food IgE profile-negative - rec rechallnge with 1st gen H1 06/01/14 > could not tolerate either 1st gen H1   nor Zyrtec so recommended go back to Claritin prn 10/19/2014  - reviewed approp use of nasal steroids/ afrin 06/13/2016    Reviewed again approp use of nasal steroids/ afrin today and contingency to try zpak for nasty secretions including purulent nasal d/c given multiple abx intol/allergies with limited options/ alternatives and call back if not cleared- it does look like allergy to omnicef was diarrhea so ok to use augmentin as backup    I had an extended discussion with the patient and wife  reviewing all relevant studies completed to date and  lasting 15 to 20 minutes of a 25 minute acute office visit    Each maintenance medication was reviewed in detail including most importantly the difference between maintenance and prns and under what circumstances the prns are to be triggered using an action plan format that is not reflected in the computer generated alphabetically organized AVS but trather by a customized med calendar that reflects the AVS meds with confirmed 100% correlation.   In addition, Please see AVS for unique instructions that I personally wrote and verbalized to the the pt in detail and then reviewed with pt  by my nurse highlighting any  changes in therapy recommended at today's visit to their plan of care.

## 2017-09-15 NOTE — Assessment & Plan Note (Signed)
Well compensated on present rx/ reviewed using med calendar for teach back

## 2017-09-17 ENCOUNTER — Telehealth: Payer: Self-pay | Admitting: *Deleted

## 2017-09-17 DIAGNOSIS — L570 Actinic keratosis: Secondary | ICD-10-CM | POA: Diagnosis not present

## 2017-09-17 DIAGNOSIS — L720 Epidermal cyst: Secondary | ICD-10-CM | POA: Diagnosis not present

## 2017-09-17 NOTE — Telephone Encounter (Signed)
Contacted and spoke with patient's wife Mackey Varricchio to schedule a home visit for this week. Visit scheduled for Friday 09/21/17 at 1pm.

## 2017-09-19 ENCOUNTER — Ambulatory Visit: Payer: Medicare Other | Admitting: Internal Medicine

## 2017-09-21 ENCOUNTER — Other Ambulatory Visit: Payer: Private Health Insurance - Indemnity | Admitting: *Deleted

## 2017-09-21 DIAGNOSIS — J0100 Acute maxillary sinusitis, unspecified: Secondary | ICD-10-CM | POA: Diagnosis not present

## 2017-09-21 DIAGNOSIS — R3912 Poor urinary stream: Secondary | ICD-10-CM | POA: Diagnosis not present

## 2017-09-22 DIAGNOSIS — F322 Major depressive disorder, single episode, severe without psychotic features: Secondary | ICD-10-CM

## 2017-09-22 DIAGNOSIS — F411 Generalized anxiety disorder: Secondary | ICD-10-CM

## 2017-09-24 ENCOUNTER — Other Ambulatory Visit: Payer: Self-pay | Admitting: Internal Medicine

## 2017-09-26 DIAGNOSIS — J019 Acute sinusitis, unspecified: Secondary | ICD-10-CM | POA: Diagnosis not present

## 2017-09-26 DIAGNOSIS — J3089 Other allergic rhinitis: Secondary | ICD-10-CM | POA: Diagnosis not present

## 2017-09-26 DIAGNOSIS — J449 Chronic obstructive pulmonary disease, unspecified: Secondary | ICD-10-CM | POA: Diagnosis not present

## 2017-09-28 ENCOUNTER — Telehealth: Payer: Self-pay | Admitting: *Deleted

## 2017-09-28 NOTE — Telephone Encounter (Signed)
Contacted residence and spoke with wife [name] to arrange a home visit for next week. Visit scheduled for Thursday 9/26 @ 3:15p

## 2017-10-02 NOTE — Progress Notes (Signed)
Cardiology Office Note:    Date:  10/03/2017   ID:  [name], DOB 1929/04/18, MRN [id number]  PCP:  [name], MD  Cardiologist:  Sinclair Grooms, MD   Referring MD: [name], MD   Chief Complaint  Patient presents with  . Coronary Artery Disease  . Congestive Heart Failure     History of Present Illness:    [name] is a 84 y.o. male with a hx of CAD with prior coronary bypass grafting, COPD with cor pulmonale, hyperlipidemia, hypertension, PAD, and history of carotid obstructive disease.  Chronic diastolic heart failure with EF with EF 55 to 65% by most recent echo (HFp EF).  Doing okay.  Feels foggy all the time.  Medical therapy has been gradually decreased over time.  Only current cardiovascular medications are furosemide 20 mg daily Aldactone 12.5 mg daily and Toprol-XL 25 mg 1/2 tablet/day.  Occasionally he gets blood pressures other lower than 700 mmHg systolic.  He is not sleeping well.    Past Medical History:  Diagnosis Date  . Arthritis    "generalized" (04/04/2017)  . Asthma   . Basal cell carcinoma    "left ear"  . BPH (benign prostatic hyperplasia)    severe; s/p multiple biopsies  . CAD (coronary artery disease)   . Carotid artery occlusion   . Chronic respiratory failure ([place])   . Chronic rhinitis   . COPD (chronic obstructive pulmonary disease) ([facility])    2L [place] O2  . Diastolic heart failure ([place]) 2019  . Elevated troponin 03/09/2017  . GERD (gastroesophageal reflux disease)   . History of blood transfusion    "w/his CABG" (04/04/2017)  . History of kidney stones   . Hyperlipidemia   . Hypertension   . On home oxygen therapy    "~ 24/7" (04/04/2017)  . Peripheral vascular disease ([place])   . Pneumonia 2019  . Syncope and collapse   . Ureteral tumor 08/2015   had endoscopic procedure for evaluation, unable to reach for biopsy    Past Surgical History:  Procedure Laterality Date  . BASAL CELL CARCINOMA EXCISION Left    ear  . CARDIAC CATHETERIZATION     "prior to bypass"  . CATARACT EXTRACTION W/ INTRAOCULAR LENS  IMPLANT, BILATERAL Bilateral   . CORONARY ARTERY BYPASS GRAFT  10/2001   LIMA to LAD, SVG to OM1-2, SVG to RCA and PDA  . LITHOTRIPSY    . PROSTATE BIOPSY  Oct. 2014  . TONSILLECTOMY      Current Medications: Current Meds  Medication Sig  . ALPRAZolam (XANAX) 0.25 MG tablet Use up to 0.25 mg three times daily as needed  . amoxicillin-clavulanate (AUGMENTIN) 875-125 MG tablet Take 1 tablet by mouth 2 (two) times daily.  Marland Kitchen aspirin 81 MG tablet Take 81 mg by mouth daily.  Marland Kitchen atorvastatin (LIPITOR) 20 MG tablet TAKE 1 TABLET BY MOUTH EVERY DAY  . Cholecalciferol (VITAMIN D3) 2000 units TABS Take 2,000 Units by mouth daily.  Marland Kitchen dextromethorphan-guaiFENesin (MUCINEX DM) 30-600 MG 12hr tablet Take 1-2 tablets by mouth every 12 (twelve) hours as needed for cough (w/ flutter valve).  . finasteride (PROSCAR) 5 MG tablet Take 5 mg by mouth every morning.  . furosemide (LASIX) 20 MG tablet TAKE 1 TABLET BY MOUTH DAILY. CAN TAKE ADDITIONAL PILL IF NEEDED FOR SWELLING  . hydrALAZINE (APRESOLINE) 25 MG tablet Take 25 mg by mouth every 6 (six) hours as needed.  . levalbuterol [facility] HFA)  45 MCG/ACT inhaler INHALE 2 PUFFS FOR PAIN EVERY 4 HOURS AS NEEDED FOR WHEEZING OR SHORTNESS OF BREATH  . levalbuterol (XOPENEX) 0.63 MG/3ML nebulizer solution USE ONE VIAL VIA NEBULIZER EVERY 4 HOURS AS NEEDED FOR WHEEZING OR SHORTNESS OF BREATH  . metoprolol succinate (TOPROL-XL) 25 MG 24 hr tablet TAKE AT 12 NOON TIME  . mometasone (NASONEX) 50 MCG/ACT nasal spray SHAKE LIQUID AND USE 2 SPRAYS IN EACH NOSTRIL DAILY  . Multiple Vitamins-Minerals (PRESERVISION AREDS 2) CAPS Take 1 capsule by mouth daily.  . nitroGLYCERIN (NITROSTAT) 0.4 MG SL tablet Place 0.4 mg under the tongue every 5 (five) minutes as needed for chest pain.  . OXYGEN Inhale 2 L continuous into the lungs.   . pantoprazole (PROTONIX) 40 MG tablet Take 40  mg by mouth daily.  . predniSONE (DELTASONE) 10 MG tablet For flare of cough/wheezing may increase to 2 daily until better, then 1 daily  . ranitidine (ZANTAC) 150 MG tablet Take 150 mg by mouth at bedtime.   Marland Kitchen SPIRIVA RESPIMAT 2.5 MCG/ACT AERS Inhale 2 puffs into the lungs daily.  Marland Kitchen spironolactone (ALDACTONE) 25 MG tablet Take 0.5 tablets (12.5 mg total) by mouth daily.  . SYMBICORT 160-4.5 MCG/ACT inhaler INHALE 2 PUFFS INTO THE LUNGS EVERY MORNING AND ANOTHER 2 PUFFS ABOUT 12 HOURS LATER     Allergies:   Cefdinir; Nitrofurantoin; Sulfa antibiotics; Sulfonamide derivatives; Cefdinir; Ciprofloxacin; Nitrofurantoin; Ciprofloxacin; Levaquin [levofloxacin]; and Levaquin [levofloxacin]   Social History   Socioeconomic History  . Marital status: Married    Spouse name: Not on file  . Number of children: 2  . Years of education: Not on file  . Highest education level: Not on file  Occupational History  . 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 Financial resource strain: Not on file  . Food insecurity:    Worry: Not on file    Inability: Not on file  . Transportation needs:    Medical: Not on file    Non-medical: Not on file  Tobacco Use  . Smoking status: Former Smoker    Packs/day: 2.00    Years: 33.00    Pack years: 66.00    Types: Cigarettes    Last attempt to quit: 03/25/1982    Years since quitting: 35.5  . Smokeless tobacco: Never Used  Substance and Sexual Activity  . Alcohol use: Yes    Alcohol/week: 0.0 standard drinks    Comment: 1.5-3 oz daily in the past, minimal use in the last few months  . Drug use: No  . Sexual activity: Not on file  Lifestyle  . Physical activity:    Days per week: Not on file    Minutes per session: Not on file  . Stress: Not on file  Relationships  . Social connections:    Talks on phone: Not on file    Gets together: Not on file    Attends religious service: Not on file    Active member  of club or organization: Not on file    Attends meetings of clubs or organizations: Not on file    Relationship status: Not on file  Other Topics Concern  . Not on file  Social History Narrative   ** Merged History Encounter **       Pt lives at home with his spouse.           Family History: The patient's family history includes Allergies in his brother; Breast cancer  in his mother; Cancer in his mother; Heart attack in his father; Heart disease in his brother, brother, father, and mother; Hyperlipidemia in his brother; Hypertension in his brother and mother; Other in his mother; Peripheral vascular disease in his brother; Stroke in his brother.  ROS:   Please see the history of present illness.    Vision disturbance, shortness of breath, abdominal pain, depression and anxiety.  Back pain, muscle pain, dizziness, and easy bruising.  Headaches, leg pain, and fatigue.  All other systems reviewed and are negative.  EKGs/Labs/Other Studies Reviewed:    The following studies were reviewed today: None  EKG:  EKG is not ordered today.   Recent Labs: 03/08/2017: TSH 1.851 03/24/2017: B Natriuretic Peptide 190.3 04/04/2017: ALT 20 06/01/2017: Magnesium 2.4 08/28/2017: BUN 23; Creatinine, Ser 1.10; Hemoglobin 13.9; Platelets 246.0; Potassium 4.3; Pro B Natriuretic peptide (BNP) 127.0; Sodium 138  Recent Lipid Panel    Component Value Date/Time   CHOL 146 03/26/2017 0621   TRIG 34 03/26/2017 0621   HDL 79 03/26/2017 0621   CHOLHDL 1.8 03/26/2017 0621   VLDL 7 03/26/2017 0621   LDLCALC 60 03/26/2017 0621    Physical Exam:    VS:  BP 136/74   Pulse 97   Ht 5\' 9"  (1.753 m)   Wt 129 lb 6.4 oz (58.7 kg)   BMI 19.11 kg/m     Wt Readings from Last 3 Encounters:  10/03/17 129 lb 6.4 oz (58.7 kg)  09/13/17 128 lb (58.1 kg)  08/30/17 130 lb (59 kg)     GEN: Elderly and frail, well developed in no acute distress HEENT: Normal NECK: No JVD. LYMPHATICS: No lymphadenopathy CARDIAC:  RRR, no murmur, S4 gallop, 1-2+ bilateral ankle edema. VASCULAR: 2+ bilateral radial pulses.  No bruits. RESPIRATORY:  Clear to auscultation without rales, wheezing or rhonchi  ABDOMEN: Soft, non-tender, non-distended, No pulsatile mass, MUSCULOSKELETAL: No deformity  SKIN: Warm and dry NEUROLOGIC:  Alert and oriented x 3 PSYCHIATRIC:  Normal affect   ASSESSMENT:    1. Coronary artery disease involving coronary bypass graft of native heart with angina pectoris ([place])   2. Essential hypertension   3. Other hyperlipidemia   4. Chronic diastolic CHF (congestive heart failure) ([place])   5. Anxiety   6. COPD GOLD IV/ 02 dep /steroid dep    PLAN:    In order of problems listed above:  1. Stable without angina.  LDL, blood pressure, A1c all at target. 2. Blood pressure is adequately controlled. 3. Last LDL 60 in March 2019. 4. No significant evidence of volume overload.  Neck veins are flat.  If blood pressure becomes low we should cut back on diuretic regimen.  Clinic follow-up in 6 months.  Team member in 3 months.  Reassurance.  Unfortunately he is on a downward and states bilateral related to COPD.   Medication Adjustments/Labs and Tests Ordered: Current medicines are reviewed at length with the patient today.  Concerns regarding medicines are outlined above.  No orders of the defined types were placed in this encounter.  No orders of the defined types were placed in this encounter.   There are no Patient Instructions on file for this visit.   Signed, Sinclair Grooms, MD  10/03/2017 1:27 PM    [facility]

## 2017-10-03 ENCOUNTER — Encounter: Payer: Self-pay | Admitting: Interventional Cardiology

## 2017-10-03 ENCOUNTER — Ambulatory Visit (INDEPENDENT_AMBULATORY_CARE_PROVIDER_SITE_OTHER): Payer: Medicare Other | Admitting: Interventional Cardiology

## 2017-10-03 VITALS — BP 136/74 | HR 97 | Ht 69.0 in | Wt 129.4 lb

## 2017-10-03 DIAGNOSIS — F419 Anxiety disorder, unspecified: Secondary | ICD-10-CM

## 2017-10-03 DIAGNOSIS — I10 Essential (primary) hypertension: Secondary | ICD-10-CM | POA: Diagnosis not present

## 2017-10-03 DIAGNOSIS — I25709 Atherosclerosis of coronary artery bypass graft(s), unspecified, with unspecified angina pectoris: Secondary | ICD-10-CM

## 2017-10-03 DIAGNOSIS — I5032 Chronic diastolic (congestive) heart failure: Secondary | ICD-10-CM | POA: Diagnosis not present

## 2017-10-03 DIAGNOSIS — I200 Unstable angina: Secondary | ICD-10-CM

## 2017-10-03 DIAGNOSIS — J449 Chronic obstructive pulmonary disease, unspecified: Secondary | ICD-10-CM

## 2017-10-03 DIAGNOSIS — E7849 Other hyperlipidemia: Secondary | ICD-10-CM | POA: Diagnosis not present

## 2017-10-03 NOTE — Patient Instructions (Addendum)
Medication Instructions:  Your physician recommends that you continue on your current medications as directed. Please refer to the Current Medication list given to you today.   Labwork: None ordered  Testing/Procedures: None ordered  Follow-Up: Your physician recommends that you schedule a follow-up appointment in: 3 months with APP on Dr. Thompson Caul team   Your physician wants you to follow-up in: 6 months with Dr. Tamala Julian. You will receive a reminder letter in the mail two months in advance. If you don't receive a letter, please call our office to schedule the follow-up appointment.   Any Other Special Instructions Will Be Listed Below (If Applicable).     If you need a refill on your cardiac medications before your next appointment, please call your pharmacy.

## 2017-10-04 ENCOUNTER — Other Ambulatory Visit: Payer: Medicare Other | Admitting: *Deleted

## 2017-10-04 DIAGNOSIS — Z515 Encounter for palliative care: Secondary | ICD-10-CM

## 2017-10-08 NOTE — Progress Notes (Signed)
COMMUNITY PALLIATIVE CARE RN NOTE  PATIENT NAME: [name] DOB: 03-15-1929 MRN: [id number]  PRIMARY CARE PROVIDER: [name], MD  RESPONSIBLE PARTY:  Acct ID - Guarantor Home Phone Work Phone Relationship Acct Type  [id number] - [name],[name] [phone]  Self P/F     [place], [name], [place] [address]    PLAN OF CARE and INTERVENTION:  1. ADVANCE CARE PLANNING/GOALS OF CARE: He wants to remain at home wife his wife and hopes for his sinus congestion to improve 2. PATIENT/CAREGIVER EDUCATION: Reinforced Safe Mobility and Energy Conservation 3. DISEASE STATUS: Met with patient and his wife [name] in their home. Patient is awake and alert, however reports that he continues to feel bad overall. Continued sinus congestion, which he says contributes to his dizziness and blurred vision. He also reports feeling tired and drowsy all of the time, wanting to sleep. He is currently on antibiotics. Today is day 7 out of the 10 day course. He states initially that he felt better, but then sinus congestion returned. He has visited his Allergist who placed him on Nasalcrom and a [place] moisturizing spray. He does not feel that this is effective. He has returned to using nasal saline rinses and Nasonex, but states that effects only last for about 20 minutes. He has had a skin test and blood work done at [facility] office, and results came back negative. He remains on Oxygen at 2L/min via [place] and is dyspneic with conversation and any exertion. Feels that sinus congestion is also interfering with his breathing. Continues to use the Trilogy machine during the night. Assisted patient in placing humidifier bottle onto concentrator as he requested. He recently had an appointment with his Cardiologist. No changes made to current heart related medications. Intake is poor. Early satiety noted. Will continue to monitor.  HISTORY OF PRESENT ILLNESS: This is a 84 yo male who resides at home with his  wife. Palliative Care Team continues to follow patient. Will contact wife next week to schedule next visit.   CODE STATUS: FULL CODE  ADVANCED DIRECTIVES: N MOST FORM: no PPS: 50%   PHYSICAL EXAM:   VITALS: Today's Vitals   10/04/17 1506  BP: 122/72  Pulse: 89  Resp: 20  SpO2: 98%  PainSc: 0-No pain    LUNGS: decreased breath sounds CARDIAC: Cor RRR EXTREMITIES: Trace edema to bilateral lower extremities SKIN: Thin/frail skin, Exposed skin dry and intact  NEURO: Alert and oriented x 3, HOH, generalized weakness, ambulatory   (Duration of visit and documentation 75 minutes)    [name], RN, BSN

## 2017-10-09 ENCOUNTER — Ambulatory Visit: Payer: Medicare Other | Admitting: Psychiatry

## 2017-10-15 DIAGNOSIS — R52 Pain, unspecified: Secondary | ICD-10-CM | POA: Diagnosis not present

## 2017-10-15 DIAGNOSIS — R5383 Other fatigue: Secondary | ICD-10-CM | POA: Diagnosis not present

## 2017-10-15 DIAGNOSIS — R509 Fever, unspecified: Secondary | ICD-10-CM | POA: Diagnosis not present

## 2017-10-15 DIAGNOSIS — J449 Chronic obstructive pulmonary disease, unspecified: Secondary | ICD-10-CM | POA: Diagnosis not present

## 2017-10-16 ENCOUNTER — Telehealth: Payer: Self-pay | Admitting: *Deleted

## 2017-10-16 NOTE — Telephone Encounter (Signed)
Contacted and spoke with patient's wife [name] to schedule a home visit. Visit scheduled for Thursday 10/18/17 at 1 pm.

## 2017-10-17 ENCOUNTER — Telehealth: Payer: Self-pay | Admitting: Internal Medicine

## 2017-10-17 ENCOUNTER — Encounter: Payer: Self-pay | Admitting: Internal Medicine

## 2017-10-17 DIAGNOSIS — J449 Chronic obstructive pulmonary disease, unspecified: Secondary | ICD-10-CM | POA: Diagnosis not present

## 2017-10-17 DIAGNOSIS — K219 Gastro-esophageal reflux disease without esophagitis: Secondary | ICD-10-CM | POA: Diagnosis not present

## 2017-10-17 DIAGNOSIS — J3089 Other allergic rhinitis: Secondary | ICD-10-CM | POA: Diagnosis not present

## 2017-10-17 NOTE — Telephone Encounter (Signed)
Spoke with the pt  He is asking what type of flu shot he should get  I advised that over the age of 45 he should get the high dose vaccine  He verbalized understanding  Nothing further needed

## 2017-10-18 ENCOUNTER — Other Ambulatory Visit: Payer: Medicare Other | Admitting: Licensed Clinical Social Worker

## 2017-10-18 ENCOUNTER — Ambulatory Visit (INDEPENDENT_AMBULATORY_CARE_PROVIDER_SITE_OTHER): Payer: Medicare Other

## 2017-10-18 DIAGNOSIS — Z23 Encounter for immunization: Secondary | ICD-10-CM | POA: Diagnosis not present

## 2017-10-23 ENCOUNTER — Other Ambulatory Visit: Payer: Self-pay | Admitting: Internal Medicine

## 2017-10-23 ENCOUNTER — Institutional Professional Consult (permissible substitution): Payer: Medicare Other | Admitting: Neurology

## 2017-10-24 ENCOUNTER — Encounter: Payer: Self-pay | Admitting: Emergency Medicine

## 2017-10-24 ENCOUNTER — Other Ambulatory Visit: Payer: Self-pay | Admitting: Urology

## 2017-10-24 DIAGNOSIS — R310 Gross hematuria: Secondary | ICD-10-CM

## 2017-10-24 DIAGNOSIS — C669 Malignant neoplasm of unspecified ureter: Secondary | ICD-10-CM | POA: Diagnosis not present

## 2017-10-24 DIAGNOSIS — R3912 Poor urinary stream: Secondary | ICD-10-CM | POA: Diagnosis not present

## 2017-10-24 DIAGNOSIS — N401 Enlarged prostate with lower urinary tract symptoms: Secondary | ICD-10-CM | POA: Diagnosis not present

## 2017-10-25 ENCOUNTER — Other Ambulatory Visit: Payer: Medicare Other | Admitting: Licensed Clinical Social Worker

## 2017-10-25 ENCOUNTER — Other Ambulatory Visit: Payer: Medicare Other | Admitting: *Deleted

## 2017-10-25 DIAGNOSIS — Z515 Encounter for palliative care: Secondary | ICD-10-CM

## 2017-10-28 NOTE — Progress Notes (Signed)
COMMUNITY PALLIATIVE CARE SW NOTE  PATIENT NAME: [name] DOB: 09/03/1929 MRN: [id number]  PRIMARY CARE PROVIDER: Ross, Charles Alan, MD  RESPONSIBLE PARTY:  Acct ID - Guarantor Home Phone Work Phone Relationship Acct Type  [id number] - Riehle,Levester L [phone]  Self P/F     [address], [place], [place] [address]     PLAN OF CARE and INTERVENTIONS:             1. GOALS OF CARE/ ADVANCE CARE PLANNING:  Patient wants to remain in his home with his wife.  He is a full code. 2. SOCIAL/EMOTIONAL/SPIRITUAL ASSESSMENT/ INTERVENTIONS:  SW and Palliative Care RN, Monishia Howard met with patient and his wife, [name], in their home.  He stated the steps in the home continue to be a challenge.  He denied pain.  He reports still being congested and is seeking medical consultation.  SW provided active listening and supportive counseling. 3. PATIENT/CAREGIVER EDUCATION/ COPING:  Patient copes by expressing his feelings openly. 4. PERSONAL EMERGENCY PLAN:  He will inform his wife with any medical concerns. 5. COMMUNITY RESOURCES COORDINATION/ HEALTH CARE NAVIGATION:  Patient utilizes the medical community to address his concerns. 6. FINANCIAL/LEGAL CONCERNS/INTERVENTIONS:  None.     SOCIAL HX:  Social History   Tobacco Use  . Smoking status: Former Smoker    Packs/day: 2.00    Years: 33.00    Pack years: 66.00    Types: Cigarettes    Last attempt to quit: 03/25/1982    Years since quitting: 35.6  . Smokeless tobacco: Never Used  Substance Use Topics  . Alcohol use: Yes    Alcohol/week: 0.0 standard drinks    Comment: 1.5-3 oz daily in the past, minimal use in the last few months    CODE STATUS:  Full Code ADVANCED DIRECTIVES: N MOST FORM COMPLETE:  N HOSPICE EDUCATION PROVIDED: N PPS:  Patient's appetite is normal.  He stands and walks independently. Duration of visit and documentation:  75 minutes.      Z , LCSW 

## 2017-10-29 ENCOUNTER — Encounter: Payer: Self-pay | Admitting: Internal Medicine

## 2017-10-29 ENCOUNTER — Ambulatory Visit: Payer: Self-pay | Admitting: Psychiatry

## 2017-10-29 ENCOUNTER — Ambulatory Visit (INDEPENDENT_AMBULATORY_CARE_PROVIDER_SITE_OTHER): Payer: Medicare Other | Admitting: Internal Medicine

## 2017-10-29 VITALS — BP 138/82 | HR 101 | Ht 69.0 in | Wt 129.8 lb

## 2017-10-29 DIAGNOSIS — J449 Chronic obstructive pulmonary disease, unspecified: Secondary | ICD-10-CM

## 2017-10-29 DIAGNOSIS — I200 Unstable angina: Secondary | ICD-10-CM | POA: Diagnosis not present

## 2017-10-29 DIAGNOSIS — J9611 Chronic respiratory failure with hypoxia: Secondary | ICD-10-CM

## 2017-10-29 DIAGNOSIS — J9612 Chronic respiratory failure with hypercapnia: Secondary | ICD-10-CM

## 2017-10-29 DIAGNOSIS — J310 Chronic rhinitis: Secondary | ICD-10-CM | POA: Diagnosis not present

## 2017-10-29 MED ORDER — ALPRAZOLAM 0.25 MG PO TABS: ORAL_TABLET | ORAL | 2 refills | Status: DC

## 2017-10-29 NOTE — Progress Notes (Signed)
Subjective:     Patient ID: HELMUT HENNON, male    DOB: 04/06/1929   MRN: [id number]   Brief patient profile:  30   yowm MM quit smoking in 1984 with GOLD IV/ 02 dep and steroid dep copd place on NIV by Dr Larkin Ina with profound chronic FTT not improved on NIV      History of Present Illness  05/25/2017  f/u ov/Manami Tutor re:  [name] iv copd/ 02 dep  Chief Complaint  Patient presents with  . Follow-up    Increased SOB and  fatigue x last week, 2.5L pulsed, compliant with spiriva, symbicort, and xopenex.   Dyspnea:  At rest/ worse walking 10 ft  Cough: none Sleep: on trilogy ok SABA use:  Confused with saba hfa vs neb  rec Add aldactone 50 mg daily  Continue triegy at your volition Ok to adjust xanax (alprazolam) from one-half to one three times a day to calm your breathing down and relieve anxiety related to breathlessness     08/28/2017 acute extended ov/Ryland Tungate re: GOLD IV copd/ cc sob/fatigue [name]  Chief Complaint  Patient presents with  . Acute Visit    For the last 5 days he has felt tired, and coughing. He start 20 mg of  pred. and Zpack   doe = MMRC4  = sob if tries to leave home or while getting dressed  Even on 02 titrated to keep sats > 90% Cough >  Min mucoid Sleeping on NIV flat comfortably rec No change medications    09/13/2017 acute extended ov/Daliana Leverett re: ? Aecopd/ new nasal complaints/ chronic resp failure ? niv dep  Chief Complaint  Patient presents with  . Acute Visit    green nasal d/c x 1 day.  Prednisone 10 mg one daily is floor  No change doe / 02 req and no purulent chest secretions  Still sleeping on vent/ not sure it's helping at all at this point  rec Go ahead and take zpak as per med calendar and call if mucus not turning back to clear and we call you in augmentin x 10 days  Change the follow up appt to 6 weeks - call sooner if needed      10/29/2017  f/u ov/Kaja Jackowski re:  Copd /resp failure  Dyspnea:  MMRC4  = sob if tries to leave home or while getting  dressed   Cough: mucus white / mostly in am  Sleeping: on NIV SABA use: 3x daily hfa/ neb qod  02: 2lpm   No obvious day to day or daytime variability or assoc   purulent sputum or mucus plugs or hemoptysis or cp or chest tightness, subjective wheeze or overt sinus or hb symptoms.   Sleeping as above  without nocturnal  or early am exacerbation  of respiratory  c/o's or need for noct saba. Also denies any obvious fluctuation of symptoms with weather or environmental changes or other aggravating or alleviating factors except as outlined above   No unusual exposure hx or h/o childhood pna/ asthma or knowledge of premature birth.  Current Allergies, Complete Past Medical History, Past Surgical History, Family History, and Social History were reviewed in [employer] record.  ROS  The following are not active complaints unless bolded Hoarseness, sore throat, dysphagia, dental problems, itching, sneezing,  nasal congestion or discharge of excess mucus or purulent secretions, ear ache,   fever, chills, sweats, unintended wt loss or wt gain, classically pleuritic or exertional cp,  orthopnea pnd  or arm/hand swelling  or leg swelling, presyncope, palpitations, abdominal pain, anorexia, nausea, vomiting, diarrhea  or change in bowel habits or change in bladder habits, change in stools or change in urine, dysuria, hematuria,  rash, arthralgias, visual complaints, headache, numbness, weakness or ataxia or problems with walking or coordination,  change in mood or  memory.        Current Meds  Medication Sig  . ALPRAZolam (XANAX) 0.25 MG tablet One four times daily  . aspirin 81 MG tablet Take 81 mg by mouth daily.  Marland Kitchen atorvastatin (LIPITOR) 20 MG tablet TAKE 1 TABLET BY MOUTH EVERY DAY  . Cholecalciferol (VITAMIN D3) 2000 units TABS Take 2,000 Units by mouth daily.  Marland Kitchen dextromethorphan-guaiFENesin (MUCINEX DM) 30-600 MG 12hr tablet Take 1-2 tablets by mouth every 12 (twelve) hours as  needed for cough (w/ flutter valve).  . finasteride (PROSCAR) 5 MG tablet Take 5 mg by mouth every morning.  . furosemide (LASIX) 20 MG tablet TAKE 1 TABLET BY MOUTH EVERY DAY. MAY TAKE ADDITIONAL TABLET IF NEEDED FOR SWELLING  . levalbuterol (XOPENEX HFA) 45 MCG/ACT inhaler INHALE 2 PUFFS FOR PAIN EVERY 4 HOURS AS NEEDED FOR WHEEZING OR SHORTNESS OF BREATH  . levalbuterol (XOPENEX) 0.63 MG/3ML nebulizer solution USE ONE VIAL VIA NEBULIZER EVERY 4 HOURS AS NEEDED FOR WHEEZING OR SHORTNESS OF BREATH  . metoprolol succinate (TOPROL-XL) 25 MG 24 hr tablet TAKE AT 12 NOON TIME  . mometasone (NASONEX) 50 MCG/ACT nasal spray SHAKE LIQUID AND USE 2 SPRAYS IN EACH NOSTRIL DAILY  . Multiple Vitamins-Minerals (PRESERVISION AREDS 2) CAPS Take 1 capsule by mouth daily.  . nitroGLYCERIN (NITROSTAT) 0.4 MG SL tablet Place 0.4 mg under the tongue every 5 (five) minutes as needed for chest pain.  . OXYGEN Inhale 2 L continuous into the lungs.   . pantoprazole (PROTONIX) 40 MG tablet Take 40 mg by mouth daily.  . predniSONE (DELTASONE) 10 MG tablet For flare of cough/wheezing may increase to 2 daily until better, then 1 daily  . ranitidine (ZANTAC) 150 MG tablet Take 150 mg by mouth at bedtime.   Marland Kitchen SPIRIVA RESPIMAT 2.5 MCG/ACT AERS Inhale 2 puffs into the lungs daily.  Marland Kitchen spironolactone (ALDACTONE) 25 MG tablet Take 0.5 tablets (12.5 mg total) by mouth daily.  . SYMBICORT 160-4.5 MCG/ACT inhaler INHALE 2 PUFFS INTO THE LUNGS EVERY MORNING AND ANOTHER 2 PUFFS ABOUT 12 HOURS LATER  .   ALPRAZolam (XANAX) 0.25 MG tablet Use up to 0.25 mg three times daily as needed  .     .     .                    Objective:   Physical Exam    wt 159 04/28/2010  > 10/14/2010 154  > 10/04/2011 150> 12/21/2011  150 > 149 01/29/2012 >154 04/05/2012 > 05/22/2012 148 > 09/30/2012  146 >>144> 10/15/2012 > 12/18/2012  145 > 148 02/14/2013 >148 03/14/2013 > 04/25/2013 148 > 06/19/2013 150 >>07/17/2013 > 08/27/2013 148 > 10/23/2013  148 >153  12/09/2013 > 02/26/2014  153 > 06/01/2014  152 > 157 06/15/2014 > 09/21/2014 146 > 10/19/2014 151 >>148 11/19/14 > 02/24/2015 148 > 06/30/2015    140 >  10/04/2015  136 > 12/20/2015   142 > 06/13/2016  132 > 09/13/2016   133 >  11/07/2016  139 > 01/11/2017   135 > 02/20/2017  135 >  03/22/2017  132 > 05/25/2017 134  > 06/27/2017  135 >  08/28/2017   135 > 09/13/2017   128  > 10/29/2017  129     amb hoarse wm quite frail but nad = baseline speaks in phrases, not full sentences due to wob      Vital signs reviewed - Note on arrival 02 sats  98% on 2lpm  poc       HEENT: nl dentition / oropharynx. Nl external ear canals without cough reflex -  Severe  bilateral non-specific turbinate edema     NECK :  without JVD/Nodes/TM/ nl carotid upstrokes bilaterally   LUNGS: no acc muscle use,  Mod barrel  contour chest wall with bilateral  Distant bs s audible wheeze and  without cough on insp or exp maneuver and mod  Hyperresonant  to  percussion bilaterally     CV:  RRR  no s3 or murmur or increase in P2, and no edema   ABD:  soft and nontender with pos mid  insp Hoover's  in the supine position. No bruits or organomegaly appreciated, bowel sounds nl  MS:   Nl gait/  ext warm without deformities, calf tenderness, cyanosis or clubbing No obvious joint restrictions   SKIN: warm and dry without lesions    NEURO:  alert, approp, nl sensorium with  no motor or cerebellar deficits apparent.

## 2017-10-29 NOTE — Patient Instructions (Addendum)
Increase xanax to 1 mg four times daliy with one extra if needed   Go ahead and try the ipatropium nasal spray as per Dr VanWinkle's recs and let him know if not helping  Follow the medication calendar    Please schedule a follow up office visit in 6 weeks, call sooner if needed  Late add:  Error in xanax instruction, rx is correct as written on prescription:  Xanax 0.25 mg take 1 four times a day

## 2017-10-30 NOTE — Progress Notes (Signed)
COMMUNITY PALLIATIVE CARE RN NOTE  PATIENT NAME: [name] DOB: 1929-12-25 MRN: [id number]  PRIMARY CARE PROVIDER: [name], MD  RESPONSIBLE PARTY:  Acct ID - Guarantor Home Phone Work Phone Relationship Acct Type  [id number] - Stansbery,Demetrus L [phone]  Self P/F     [place], [name], [place] [address]    PLAN OF CARE and INTERVENTION:  1. ADVANCE CARE PLANNING/GOALS OF CARE: Remain at home with his wife and avoid hospitalizations 2. PATIENT/CAREGIVER EDUCATION: Reinforced Safe Mobility and Energy Conservation 3. DISEASE STATUS: Joint visit made with Palliative Care SW, Lynn Duffy. Patient answered the door upon arrival. He reports continued sinus congestion. Also reports feeling dizzy often and vision is more blurred. He is using saline rinses to try and help with congestion. Continued dyspnea on exertion and during conversation. Remains on continuous Oxygen at 2L/min via  and uses a Trilogy machine throughout the night. He reports not sleeping well during the night, so ended up sleeping later into the day today. He reports recently experiencing some hematuria. Only one episode. 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 He was referred to a Urologist. He also has a referral to a Neurologist due to dizziness, leg weakness and increased shakiness. His appetite is poor to fair. Feels that his clothes are fitting more loosely. He remains able to perform ADLs independently. More often, he is unable to perform his daily exercises given to him by his home health PT about  2 months ago due to fatigue. Slight edema noted to bilateral lower extremities. Continues on Aldactone daily. Denies pain. Will continue to monitor.   HISTORY OF PRESENT ILLNESS:    CODE STATUS: FULL CODE ADVANCED DIRECTIVES: Y (HCPOA/Living Will) MOST FORM: no PPS: 50%   PHYSICAL EXAM:   VITALS: Today's Vitals   10/25/17 1340  BP: 122/60  Pulse: 91  Resp: 20  SpO2: 97%   PainSc: 0-No pain    LUNGS: clear to auscultation , decreased breath sounds CARDIAC: Cor irreg EXTREMITIES: 1+ and non-pitting edema bilateral lower extremities SKIN: Large bruise noted to R hand (unsure of cause), thin/frail skin  NEURO: Alert and oriented x 3, pleasant mood, generalized weakness, ambulatory   (Duration of visit and documentation 75 minutes)    [name], RN, BSN

## 2017-11-01 ENCOUNTER — Ambulatory Visit (HOSPITAL_COMMUNITY)
Admission: RE | Admit: 2017-11-01 | Discharge: 2017-11-01 | Disposition: A | Discharge status: Home / Self Care | Payer: Medicare Other | Source: Ambulatory Visit | Attending: Urology | Admitting: Urology

## 2017-11-01 ENCOUNTER — Encounter (HOSPITAL_COMMUNITY): Payer: Self-pay

## 2017-11-01 DIAGNOSIS — R310 Gross hematuria: Secondary | ICD-10-CM

## 2017-11-01 NOTE — Progress Notes (Signed)
Pt left without being seen. Tech called office and and had [name] paged to clarify order and have creatinine labs ordered around 3pm. Pt states he cannot wait all day at 1530.

## 2017-11-01 NOTE — Progress Notes (Signed)
[name] returned page at 1540, clarified order and informed tech he had labs at office (unable to view in Epic) and they were in normal range. Called pts mobile and offered for pt to return and have scan. Pt requested to re schedule and was given centralized scheduling phone number.

## 2017-11-02 ENCOUNTER — Telehealth: Payer: Self-pay | Admitting: *Deleted

## 2017-11-02 ENCOUNTER — Encounter: Payer: Self-pay | Admitting: Internal Medicine

## 2017-11-02 NOTE — Telephone Encounter (Signed)
-----   Message from Tanda Rockers, MD sent at 11/02/2017  6:05 AM EDT -----  Error in xanax instruction, rx is correct as written on prescription:  Xanax 0.25 mg take 1 four times a day

## 2017-11-02 NOTE — Assessment & Plan Note (Signed)
-   Start noct 02 at 2lpm 2008 and on 03/04/09 desat @ > 185 ft so rec wear with activtiy > rm to rm  - 02/15/2011   Walked RA x one lap @ 185 stopped due to  desat > corrected on 2lpm - 02/26/2014  Walked 2lpm  2 laps @ 185 ft each stopped due to  Sob/ sats 88% at nl pace  - 10/19/2014   Walked RA  2 laps @ 185 ft each stopped due to  End of study, slow pace,  sob  But no  desat   05/21/15 Eval by Dr Dedio/ rec trial of NIV  - HCO3  10/27/15 = 34  - HC03  10/06/16  = 28 ? On noct vent (non compliant) - As of admit  02/04/17 back on noct vent/ 02 2lpm 24/7 - HC03 36  08/28/2017 despite reporting using noct vent    Continue noct vent for now/ 02 daytime as he is

## 2017-11-02 NOTE — Assessment & Plan Note (Signed)
-   PFTs  05/04/05 FEV1 36% ratio 28% with 29% improvement after bronchodilators DLCO 48%  - PFT's 03/26/08 30% ratio 34 with 14% improvement after bronchodilators DLC0 38 %  - Referred to rehab 12/19/2012 > completed March 2015  - changed to spiriva respimat 02/14/2013  - started daily prednisone 06/19/13 > improved only a little> tapered off mid July 2015 > ok to change to prn prednisone 08/27/2013  - flared off nasonex and gerd rx 09/21/2014 > resumed - 10/19/2014  p extensive coaching HFA effectiveness =    90%  - changed pred to ceiling of 20 / floor of 5 mg daily as of 10/19/2014 > changed to floor of 10 mg daily 10/04/2015  - PFT's  06/08/2015  FEV1 0.58 (22 % ) ratio 27  p No sign % improvement from saba p symb/spiriva prior to study with DLCO  9/9 % corrects to 27 % for alv volume   - changed pred to ceiling of 20 and floor of 5 mg daily 06/13/16 > increased floor to 10 mg daily effective 02/20/2017  - referred back to rehab 09/13/2016  - try off noct vent and start titrating up xanax 01/11/2017 > no change but then admitted with flu and placed back on vent  - alpha one AT 03/01/17  MM     - 10/29/2017  After extensive coaching inhaler device,  effectiveness =    75% (Ti short)    Really nothing left to offer at this point other than palliative rx - anxiety is driving many of his symptoms so rec try xanax 0.25 mg qid and low threshold to change to clonazepam if xanax not effective

## 2017-11-02 NOTE — Assessment & Plan Note (Addendum)
Sinus CT 03/03/10 chronic changes only  - rec rechallenge with qnasal 12/21/11  - Trial of 1st gen h1 and hs h2 rec 05/23/2012  - Allergy profile 12/03/2012-negative-IgE 94.8     Food IgE profile-negative - rec rechallnge with 1st gen H1 06/01/14 > could not tolerate either 1st gen H1   nor Zyrtec so recommended go back to Claritin prn 10/19/2014  - reviewed approp use of nasal steroids/ afrin 06/13/2016    - trial of atrovent per allergy rec but had not started as of 10/29/2017 > rec try it and f/u with allergy prn    I had an extended discussion with the patient and wife reviewing all relevant studies completed to date and  lasting 15 to 20 minutes of a 25 minute visit    See device teaching which extended face to face time for this visit   Each maintenance medication was reviewed in detail including most importantly the difference between maintenance and prns and under what circumstances the prns are to be triggered using an action plan format that is not reflected in the computer generated alphabetically organized AVS but trather by a customized med calendar that reflects the AVS meds with confirmed 100% correlation.   In addition, Please see AVS for unique instructions that I personally wrote and verbalized to the the pt in detail and then reviewed with pt  by my nurse highlighting any  changes in therapy recommended at today's visit to their plan of care.

## 2017-11-02 NOTE — Telephone Encounter (Signed)
Spoke with the pt and notified of recs per MW  He verbalized understanding and is clear on instructions

## 2017-11-08 ENCOUNTER — Telehealth: Payer: Self-pay | Admitting: Neurology

## 2017-11-08 ENCOUNTER — Institutional Professional Consult (permissible substitution): Payer: Medicare Other | Admitting: Neurology

## 2017-11-08 NOTE — Telephone Encounter (Signed)
This patient canceled the same day of a new patient appointment.

## 2017-11-09 ENCOUNTER — Ambulatory Visit (INDEPENDENT_AMBULATORY_CARE_PROVIDER_SITE_OTHER)
Admission: RE | Admit: 2017-11-09 | Discharge: 2017-11-09 | Disposition: A | Discharge status: Home / Self Care | Payer: Medicare Other | Source: Ambulatory Visit | Attending: Primary Care | Admitting: Primary Care

## 2017-11-09 ENCOUNTER — Telehealth: Payer: Self-pay | Admitting: Internal Medicine

## 2017-11-09 ENCOUNTER — Ambulatory Visit (INDEPENDENT_AMBULATORY_CARE_PROVIDER_SITE_OTHER): Payer: Medicare Other | Admitting: Primary Care

## 2017-11-09 ENCOUNTER — Encounter: Payer: Self-pay | Admitting: Primary Care

## 2017-11-09 ENCOUNTER — Encounter: Payer: Self-pay | Admitting: Neurology

## 2017-11-09 ENCOUNTER — Other Ambulatory Visit (INDEPENDENT_AMBULATORY_CARE_PROVIDER_SITE_OTHER): Payer: Medicare Other

## 2017-11-09 VITALS — BP 126/78 | HR 90 | Ht 69.0 in | Wt 129.0 lb

## 2017-11-09 DIAGNOSIS — I5032 Chronic diastolic (congestive) heart failure: Secondary | ICD-10-CM | POA: Diagnosis not present

## 2017-11-09 DIAGNOSIS — J449 Chronic obstructive pulmonary disease, unspecified: Secondary | ICD-10-CM

## 2017-11-09 DIAGNOSIS — R0602 Shortness of breath: Secondary | ICD-10-CM | POA: Diagnosis not present

## 2017-11-09 DIAGNOSIS — R05 Cough: Secondary | ICD-10-CM | POA: Diagnosis not present

## 2017-11-09 LAB — CBC
HCT: 41.4 % (ref 39.0–52.0)
Hemoglobin: 13.9 g/dL (ref 13.0–17.0)
MCHC: 33.5 g/dL (ref 30.0–36.0)
MCV: 93.6 fl (ref 78.0–100.0)
Platelets: 258 10*3/uL (ref 150.0–400.0)
RBC: 4.42 Mil/uL (ref 4.22–5.81)
RDW: 14 % (ref 11.5–15.5)
WBC: 12.7 10*3/uL — ABNORMAL HIGH (ref 4.0–10.5)

## 2017-11-09 LAB — BASIC METABOLIC PANEL
BUN: 23 mg/dL (ref 6–23)
CO2: 36 mEq/L — ABNORMAL HIGH (ref 19–32)
Calcium: 9.8 mg/dL (ref 8.4–10.5)
Chloride: 96 mEq/L (ref 96–112)
Creatinine, Ser: 1.14 mg/dL (ref 0.40–1.50)
GFR: 64.7 mL/min (ref >60.00)
Glucose, Bld: 114 mg/dL — ABNORMAL HIGH (ref 70–99)
Potassium: 4.3 mEq/L (ref 3.5–5.1)
Sodium: 139 mEq/L (ref 135–145)

## 2017-11-09 LAB — BRAIN NATRIURETIC PEPTIDE: Pro B Natriuretic peptide (BNP): 100 pg/mL (ref 0.0–100.0)

## 2017-11-09 MED ORDER — DOXYCYCLINE HYCLATE 100 MG PO TABS: 100.0000 mg | ORAL_TABLET | Freq: Two times a day (BID) | ORAL | 0 refills | Status: DC

## 2017-11-09 NOTE — Patient Instructions (Addendum)
Take prednisone 4 tabs x 3 days; 3 tabs x 3 days; 2 tabs x 3 days then return to baseline dose    CXR and labs today (Will call with results this afternoon or Monday)   DME referral for respiratory therapist eval

## 2017-11-09 NOTE — Progress Notes (Signed)
@Patient  ID: [name], male    DOB: 1929/02/16, 84 y.o.   MRN: [id number]  Chief Complaint  Patient presents with  . Acute Visit    States he is having increased SOB worse than his usual and chest tightness, cough with green mucous x 1 week. Took a Zpack and finished wednesday.     Referring provider: [name], MD  HPI: 84 year old male, former smoker. PMH COPD IV, chronic resp failure with hypoxia. Saw Dr. [name] on 10/21, doing well.   11/10/2017  Patient presents today with acute complains of sob, chest tightness and mucus x1 week. States that he is much more short of breath than usual. Breathing is controlled when at rest, however, easily out of breath with talking or on exertion. Has cough with green sputum. Continues prednisone 10mg  daily. Completed prn zpack 2 days ago with no improvement. Uses Trilogy at night, unsure if it helps. Needs some help with his respiratory supplies at home. Up to date with influenza vaccine.    Allergies  Allergen Reactions  . Cefdinir Other (See Comments)    Severe Diarrhea  . Nitrofurantoin Swelling    Hand Swelling  . Sulfa Antibiotics Anaphylaxis and Swelling  . Sulfonamide Derivatives Swelling    REACTION: facial/tongue swelling  . Cefdinir Diarrhea    REACTION: diarrhea  . Ciprofloxacin Itching and Rash    Red itchy hands  . Nitrofurantoin Swelling    Swollen hands  . Ciprofloxacin Itching and Rash  . Levaquin [Levofloxacin] Itching and Rash  . Levaquin [Levofloxacin] Itching and Rash    Immunization History  Administered Date(s) Administered  . Influenza Split 10/10/2010, 10/04/2011, 10/14/2013  . Influenza Whole 10/01/2007, 11/06/2008, 11/09/2009  . Influenza, High Dose Seasonal PF 11/07/2016, 10/18/2017  . Influenza,inj,Quad PF,6+ Mos 09/30/2012, 10/19/2014  . Influenza-Unspecified 09/07/2015  . Pneumococcal Conjugate-13 08/27/2013  . Pneumococcal Polysaccharide-23 12/11/2001, 05/20/2015    Past Medical  History:  Diagnosis Date  . Arthritis    "generalized" (04/04/2017)  . Asthma   . Basal cell carcinoma    "left ear"  . BPH (benign prostatic hyperplasia)    severe; s/p multiple biopsies  . CAD (coronary artery disease)   . Carotid artery occlusion   . Chronic respiratory failure ([facility])   . Chronic rhinitis   . COPD (chronic obstructive pulmonary disease) (HCC)    2L [place] O2  . Diastolic heart failure ([facility]) 2019  . Elevated troponin 03/09/2017  . GERD (gastroesophageal reflux disease)   . History of blood transfusion    "w/his CABG" (04/04/2017)  . History of kidney stones   . Hyperlipidemia   . Hypertension   . On home oxygen therapy    "~ 24/7" (04/04/2017)  . Peripheral vascular disease ([facility])   . Pneumonia 2019  . Syncope and collapse   . Ureteral tumor 08/2015   had endoscopic procedure for evaluation, unable to reach for biopsy    Tobacco History: Social History   Tobacco Use  Smoking Status Former Smoker  . Packs/day: 2.00  . Years: 33.00  . Pack years: 66.00  . Types: Cigarettes  . Last attempt to quit: 03/25/1982  . Years since quitting: 35.6  Smokeless Tobacco Never Used   Counseling given: Not Answered   Outpatient Medications Prior to Visit  Medication Sig Dispense Refill  . ALPRAZolam (XANAX) 0.25 MG tablet One four times daily 120 tablet 2  . aspirin 81 MG tablet Take 81 mg by mouth daily.    Marland Kitchen  atorvastatin (LIPITOR) 20 MG tablet TAKE 1 TABLET BY MOUTH EVERY DAY 90 tablet 2  . Cholecalciferol (VITAMIN D3) 2000 units TABS Take 2,000 Units by mouth daily.    Marland Kitchen dextromethorphan-guaiFENesin (MUCINEX DM) 30-600 MG 12hr tablet Take 1-2 tablets by mouth every 12 (twelve) hours as needed for cough (w/ flutter valve).    . finasteride (PROSCAR) 5 MG tablet Take 5 mg by mouth every morning.    . furosemide (LASIX) 20 MG tablet TAKE 1 TABLET BY MOUTH EVERY DAY. MAY TAKE ADDITIONAL TABLET IF NEEDED FOR SWELLING 45 tablet 2  . levalbuterol (XOPENEX HFA) 45 MCG/ACT  inhaler INHALE 2 PUFFS FOR PAIN EVERY 4 HOURS AS NEEDED FOR WHEEZING OR SHORTNESS OF BREATH 15 g 3  . levalbuterol (XOPENEX) 0.63 MG/3ML nebulizer solution USE ONE VIAL VIA NEBULIZER EVERY 4 HOURS AS NEEDED FOR WHEEZING OR SHORTNESS OF BREATH 300 mL 3  . metoprolol succinate (TOPROL-XL) 25 MG 24 hr tablet TAKE AT 12 NOON TIME 45 tablet 3  . mometasone (NASONEX) 50 MCG/ACT nasal spray SHAKE LIQUID AND USE 2 SPRAYS IN EACH NOSTRIL DAILY 17 g 11  . Multiple Vitamins-Minerals (PRESERVISION AREDS 2) CAPS Take 1 capsule by mouth daily.    . nitroGLYCERIN (NITROSTAT) 0.4 MG SL tablet Place 0.4 mg under the tongue every 5 (five) minutes as needed for chest pain.    . OXYGEN Inhale 2 L continuous into the lungs.     . pantoprazole (PROTONIX) 40 MG tablet Take 40 mg by mouth daily.    . predniSONE (DELTASONE) 10 MG tablet For flare of cough/wheezing may increase to 2 daily until better, then 1 daily 100 tablet 2  . ranitidine (ZANTAC) 150 MG tablet Take 150 mg by mouth at bedtime.   0  . SPIRIVA RESPIMAT 2.5 MCG/ACT AERS Inhale 2 puffs into the lungs daily.  11  . spironolactone (ALDACTONE) 25 MG tablet Take 0.5 tablets (12.5 mg total) by mouth daily. 45 tablet 3  . SYMBICORT 160-4.5 MCG/ACT inhaler INHALE 2 PUFFS INTO THE LUNGS EVERY MORNING AND ANOTHER 2 PUFFS ABOUT 12 HOURS LATER 10.2 g 11   No facility-administered medications prior to visit.     Review of Systems  Review of Systems  Constitutional: Negative.   HENT: Negative.   Respiratory: Positive for cough and shortness of breath. Negative for wheezing.   Cardiovascular: Positive for leg swelling.   Physical Exam  BP 126/78   Pulse 90   Ht 5\' 9"  (1.753 m)   Wt 129 lb (58.5 kg)   SpO2 94%   BMI 19.05 kg/m  Physical Exam  Constitutional: He is oriented to person, place, and time. He appears well-developed and well-nourished.  HENT:  Head: Normocephalic and atraumatic.  Eyes: Pupils are equal, round, and reactive to light. EOM are  normal.  Neck: Normal range of motion. Neck supple.  Cardiovascular: Normal rate and regular rhythm.  +1 BLE edema   Pulmonary/Chest: Effort normal. No respiratory distress. He has no wheezes.  CTA On home oxygen   Musculoskeletal:  In [facility]  Neurological: He is alert and oriented to person, place, and time.  Skin: Skin is warm and dry.  Psychiatric: He has a normal mood and affect. His behavior is normal. Judgment and thought content normal.     Lab Results:  CBC    Component Value Date/Time   WBC 12.7 (H) 11/09/2017 1618   RBC 4.42 11/09/2017 1618   HGB 13.9 11/09/2017 1618   HCT 41.4 11/09/2017 1618  PLT 258.0 11/09/2017 1618   MCV 93.6 11/09/2017 1618   MCH 31.7 04/05/2017 0612   MCHC 33.5 11/09/2017 1618   RDW 14.0 11/09/2017 1618   LYMPHSABS 0.5 (L) 08/28/2017 1658   MONOABS 1.0 08/28/2017 1658   EOSABS 0.1 08/28/2017 1658   BASOSABS 0.1 08/28/2017 1658    BMET    Component Value Date/Time   NA 139 11/09/2017 1618   NA 139 07/09/2017 1325   K 4.3 11/09/2017 1618   CL 96 11/09/2017 1618   CO2 36 (H) 11/09/2017 1618   GLUCOSE 114 (H) 11/09/2017 1618   BUN 23 11/09/2017 1618   BUN 15 07/09/2017 1325   CREATININE 1.14 11/09/2017 1618   CALCIUM 9.8 11/09/2017 1618   GFRNONAA 68 07/09/2017 1325   GFRAA 78 07/09/2017 1325    BNP    Component Value Date/Time   BNP 190.3 (H) 03/24/2017 2131    ProBNP    Component Value Date/Time   PROBNP 100.0 11/09/2017 1618    Imaging: Dg Chest 2 View  Result Date: 11/09/2017 CLINICAL DATA:  Chronic worsening shortness of breath, cough with green mucus, hypertension, former smoker, asthma, COPD, coronary artery disease EXAM: CHEST - 2 VIEW COMPARISON:  06/21/2017 FINDINGS: Normal heart size post CABG. None Atherosclerotic calcification aorta. Mediastinal contours and pulmonary vascularity normal. Emphysematous and bronchitic changes consistent with COPD. Chronic blunting of the posterior costophrenic angles. No acute  infiltrate, pleural effusion or pneumothorax. Bones demineralized. IMPRESSION: Post CABG. COPD changes with chronic basilar scarring. No acute abnormalities. Electronically Signed   By: Lavonia Dana M.D.   On: 11/09/2017 16:22     Assessment & Plan:   COPD   GOLD IV/ 02/steroid dep  - COPD exacerbation d/t URI symptoms - No improvement with zpack, given RX doxy and prednisone taper - CXR with no acute abnormality - Respiratory therapy home eval/treat   Chronic diastolic CHF (congestive heart failure) (HCC) - BNP 100 - +1 BLE - Continues Lasix 20mg  daily and Spirnolactone 12.5mg       Martyn Ehrich, NP 11/10/2017

## 2017-11-09 NOTE — Telephone Encounter (Signed)
Spoke with the pt  He is c/o cough with green sputum  Just finished zpack without any relief  OV with Beth at 3 pm today

## 2017-11-10 ENCOUNTER — Encounter: Payer: Self-pay | Admitting: Primary Care

## 2017-11-10 NOTE — Assessment & Plan Note (Addendum)
-   COPD exacerbation d/t URI symptoms - No improvement with zpack - RX doxycycline x 7 days and prednisone taper as prescribed  - CXR with no acute abnormality - Respiratory therapy home eval/treat  - Return if no improvement or if symptoms worsen

## 2017-11-10 NOTE — Assessment & Plan Note (Addendum)
-   BNP 100 - Continues Lasix 20mg  daily and Spirnolactone 12.5mg

## 2017-11-12 NOTE — Progress Notes (Signed)
Chart and office note reviewed in detail  > agree with a/p as outlined    

## 2017-11-13 ENCOUNTER — Telehealth: Payer: Self-pay | Admitting: Primary Care

## 2017-11-13 DIAGNOSIS — J449 Chronic obstructive pulmonary disease, unspecified: Secondary | ICD-10-CM

## 2017-11-13 NOTE — Telephone Encounter (Signed)
lmtcb x1 for [facility] w/[facility]

## 2017-11-15 NOTE — Telephone Encounter (Signed)
Spoke with Gordy Councilman did you want to write an order for this? Please advise  Message received from [place] -    We don't provide in home RT for pts. We do have a COPD Program that we can do through Skilled Nursing. Would Beth, be interested in this type of service? They do med management and disease education. Just let me know.  -I sent him a message back & asked him to put in a triage message & they can check with [facility] & place a new order if needed.

## 2017-11-15 NOTE — Telephone Encounter (Signed)
Ok referral placed. Nothing further is needed.

## 2017-11-15 NOTE — Telephone Encounter (Signed)
Yes please. Thanks. 

## 2017-11-19 NOTE — Addendum Note (Signed)
Addended by: Jannette Spanner on: 11/19/2017 09:07 AM   Modules accepted: Orders

## 2017-11-20 ENCOUNTER — Ambulatory Visit: Payer: Medicare Other | Admitting: Podiatry

## 2017-11-20 DIAGNOSIS — R5381 Other malaise: Secondary | ICD-10-CM | POA: Diagnosis not present

## 2017-11-20 DIAGNOSIS — E559 Vitamin D deficiency, unspecified: Secondary | ICD-10-CM | POA: Diagnosis not present

## 2017-11-20 DIAGNOSIS — R0981 Nasal congestion: Secondary | ICD-10-CM | POA: Diagnosis not present

## 2017-11-23 ENCOUNTER — Ambulatory Visit (INDEPENDENT_AMBULATORY_CARE_PROVIDER_SITE_OTHER): Payer: Medicare Other | Admitting: Podiatry

## 2017-11-23 DIAGNOSIS — M79675 Pain in left toe(s): Secondary | ICD-10-CM

## 2017-11-23 DIAGNOSIS — B351 Tinea unguium: Secondary | ICD-10-CM

## 2017-11-23 DIAGNOSIS — M79674 Pain in right toe(s): Secondary | ICD-10-CM

## 2017-11-23 DIAGNOSIS — I739 Peripheral vascular disease, unspecified: Secondary | ICD-10-CM

## 2017-11-23 NOTE — Patient Instructions (Signed)

## 2017-11-27 ENCOUNTER — Other Ambulatory Visit: Payer: Medicare Other | Admitting: *Deleted

## 2017-11-27 DIAGNOSIS — Z515 Encounter for palliative care: Secondary | ICD-10-CM

## 2017-11-29 NOTE — Progress Notes (Signed)
COMMUNITY PALLIATIVE CARE RN NOTE  PATIENT NAME: [name] DOB: 01/08/1930 MRN: [id number]  PRIMARY CARE PROVIDER: Ross, Charles Alan, MD  RESPONSIBLE PARTY:  Acct ID - Guarantor Home Phone Work Phone Relationship Acct Type  [id number] - Hindley,Axcel L [phone]  Self P/F     [address], [place], [place] [address]    PLAN OF CARE and INTERVENTION:  1. ADVANCE CARE PLANNING/GOALS OF CARE: Remain in his home for as long as possible and wants to get stronger 2. PATIENT/CAREGIVER EDUCATION: Reinforced Safe Mobility and Energy Conservation 3. DISEASE STATUS: Met with patient and his wife in their home. Received a call from patient's wife yesterday who states that patient has not been feeling well and requested a visit. He reports increased drowsiness, making it difficult to keep his eyes open. He also reports continued increase in leg weakness. He says he has not been able to walk any on his treadmill due to increased shortness of breath with any exertion, even conversation. He was recently started on Dymista for his nasal congestion. He feels that it helps with his congestion, but thinks that it may be causing the increased drowsiness. I notice that patient has significant edema (much more than usual) in his left lower extremity. His weight today was 128.4 lbs. He usually weighs around 125 lbs on previous visits. He may also have some fluid build up around his lungs, causing the increased breathlessness. He has orders from his Cardiologist to take an additional Lasix tablet with leg swelling. He advised he forgot about this order and will take an additional tablet today, and subsequent days if swelling persists. Will continue to monitor.  HISTORY OF PRESENT ILLNESS: This is a 84 yo male who resides at home with his wife. Palliative Care Team continues to follow patient.    CODE STATUS: FULL CODE ADVANCED DIRECTIVES: N MOST FORM: no PPS: 50%   PHYSICAL EXAM:   VITALS: Today's Vitals   11/27/17 1625  BP: 110/62  Pulse: 95  Resp: (!) 22  SpO2: 96%  PainSc: 0-No pain    LUNGS: decreased breath sounds CARDIAC: Cor RRR EXTREMITIES: 3+ edema L lower extremity SKIN: Scattered bruising noted to bilateral arms/legs, thin/frail skin  NEURO: Alert and oriented x 3, HOH, increased generalized weakness, ambulatory   (Duration of visit and documentation 75 minutes)     , RN, BSN 

## 2017-11-30 ENCOUNTER — Encounter: Payer: Self-pay | Admitting: *Deleted

## 2017-12-01 DIAGNOSIS — J9611 Chronic respiratory failure with hypoxia: Secondary | ICD-10-CM | POA: Diagnosis not present

## 2017-12-01 DIAGNOSIS — I5032 Chronic diastolic (congestive) heart failure: Secondary | ICD-10-CM | POA: Diagnosis not present

## 2017-12-01 DIAGNOSIS — I2510 Atherosclerotic heart disease of native coronary artery without angina pectoris: Secondary | ICD-10-CM | POA: Diagnosis not present

## 2017-12-01 DIAGNOSIS — I110 Hypertensive heart disease with heart failure: Secondary | ICD-10-CM | POA: Diagnosis not present

## 2017-12-01 DIAGNOSIS — Z9981 Dependence on supplemental oxygen: Secondary | ICD-10-CM | POA: Diagnosis not present

## 2017-12-01 DIAGNOSIS — Z7982 Long term (current) use of aspirin: Secondary | ICD-10-CM | POA: Diagnosis not present

## 2017-12-01 DIAGNOSIS — I739 Peripheral vascular disease, unspecified: Secondary | ICD-10-CM | POA: Diagnosis not present

## 2017-12-01 DIAGNOSIS — Z87891 Personal history of nicotine dependence: Secondary | ICD-10-CM | POA: Diagnosis not present

## 2017-12-01 DIAGNOSIS — E785 Hyperlipidemia, unspecified: Secondary | ICD-10-CM | POA: Diagnosis not present

## 2017-12-01 DIAGNOSIS — J441 Chronic obstructive pulmonary disease with (acute) exacerbation: Secondary | ICD-10-CM | POA: Diagnosis not present

## 2017-12-03 ENCOUNTER — Encounter (HOSPITAL_COMMUNITY): Payer: Self-pay | Admitting: Emergency Medicine

## 2017-12-03 ENCOUNTER — Ambulatory Visit: Payer: Medicare Other | Admitting: Internal Medicine

## 2017-12-03 ENCOUNTER — Other Ambulatory Visit: Payer: Self-pay

## 2017-12-03 ENCOUNTER — Observation Stay (HOSPITAL_COMMUNITY)
Admission: EM | Admit: 2017-12-03 | Discharge: 2017-12-04 | Disposition: A | Discharge status: Home / Self Care | Payer: Medicare Other | Attending: Internal Medicine | Admitting: Internal Medicine

## 2017-12-03 ENCOUNTER — Telehealth: Payer: Self-pay | Admitting: Internal Medicine

## 2017-12-03 ENCOUNTER — Observation Stay (HOSPITAL_COMMUNITY): Payer: Medicare Other

## 2017-12-03 ENCOUNTER — Emergency Department (HOSPITAL_COMMUNITY): Payer: Medicare Other

## 2017-12-03 DIAGNOSIS — E785 Hyperlipidemia, unspecified: Secondary | ICD-10-CM | POA: Diagnosis not present

## 2017-12-03 DIAGNOSIS — I2583 Coronary atherosclerosis due to lipid rich plaque: Secondary | ICD-10-CM | POA: Diagnosis not present

## 2017-12-03 DIAGNOSIS — R627 Adult failure to thrive: Secondary | ICD-10-CM | POA: Diagnosis not present

## 2017-12-03 DIAGNOSIS — R079 Chest pain, unspecified: Secondary | ICD-10-CM | POA: Diagnosis not present

## 2017-12-03 DIAGNOSIS — Z7982 Long term (current) use of aspirin: Secondary | ICD-10-CM | POA: Diagnosis not present

## 2017-12-03 DIAGNOSIS — I2510 Atherosclerotic heart disease of native coronary artery without angina pectoris: Secondary | ICD-10-CM | POA: Diagnosis not present

## 2017-12-03 DIAGNOSIS — I110 Hypertensive heart disease with heart failure: Secondary | ICD-10-CM | POA: Diagnosis not present

## 2017-12-03 DIAGNOSIS — Z87442 Personal history of urinary calculi: Secondary | ICD-10-CM | POA: Insufficient documentation

## 2017-12-03 DIAGNOSIS — Z882 Allergy status to sulfonamides status: Secondary | ICD-10-CM | POA: Insufficient documentation

## 2017-12-03 DIAGNOSIS — Z9981 Dependence on supplemental oxygen: Secondary | ICD-10-CM | POA: Diagnosis not present

## 2017-12-03 DIAGNOSIS — R072 Precordial pain: Secondary | ICD-10-CM

## 2017-12-03 DIAGNOSIS — K219 Gastro-esophageal reflux disease without esophagitis: Secondary | ICD-10-CM | POA: Insufficient documentation

## 2017-12-03 DIAGNOSIS — Z79899 Other long term (current) drug therapy: Secondary | ICD-10-CM | POA: Insufficient documentation

## 2017-12-03 DIAGNOSIS — R531 Weakness: Secondary | ICD-10-CM

## 2017-12-03 DIAGNOSIS — J449 Chronic obstructive pulmonary disease, unspecified: Secondary | ICD-10-CM | POA: Diagnosis present

## 2017-12-03 DIAGNOSIS — R42 Dizziness and giddiness: Secondary | ICD-10-CM | POA: Diagnosis present

## 2017-12-03 DIAGNOSIS — E1151 Type 2 diabetes mellitus with diabetic peripheral angiopathy without gangrene: Secondary | ICD-10-CM | POA: Diagnosis not present

## 2017-12-03 DIAGNOSIS — I700 Atherosclerosis of aorta: Secondary | ICD-10-CM | POA: Diagnosis not present

## 2017-12-03 DIAGNOSIS — N400 Enlarged prostate without lower urinary tract symptoms: Secondary | ICD-10-CM | POA: Diagnosis not present

## 2017-12-03 DIAGNOSIS — Z85828 Personal history of other malignant neoplasm of skin: Secondary | ICD-10-CM | POA: Diagnosis not present

## 2017-12-03 DIAGNOSIS — Z951 Presence of aortocoronary bypass graft: Secondary | ICD-10-CM | POA: Diagnosis not present

## 2017-12-03 DIAGNOSIS — M6281 Muscle weakness (generalized): Secondary | ICD-10-CM | POA: Insufficient documentation

## 2017-12-03 DIAGNOSIS — I951 Orthostatic hypotension: Secondary | ICD-10-CM | POA: Diagnosis not present

## 2017-12-03 DIAGNOSIS — F419 Anxiety disorder, unspecified: Secondary | ICD-10-CM | POA: Diagnosis present

## 2017-12-03 DIAGNOSIS — K5730 Diverticulosis of large intestine without perforation or abscess without bleeding: Secondary | ICD-10-CM | POA: Diagnosis not present

## 2017-12-03 DIAGNOSIS — Z87891 Personal history of nicotine dependence: Secondary | ICD-10-CM | POA: Insufficient documentation

## 2017-12-03 DIAGNOSIS — Z881 Allergy status to other antibiotic agents status: Secondary | ICD-10-CM | POA: Insufficient documentation

## 2017-12-03 DIAGNOSIS — I5032 Chronic diastolic (congestive) heart failure: Secondary | ICD-10-CM | POA: Diagnosis not present

## 2017-12-03 DIAGNOSIS — R296 Repeated falls: Secondary | ICD-10-CM | POA: Insufficient documentation

## 2017-12-03 DIAGNOSIS — R0789 Other chest pain: Secondary | ICD-10-CM | POA: Diagnosis not present

## 2017-12-03 LAB — INFLUENZA PANEL BY PCR (TYPE A & B)
Influenza A By PCR: NEGATIVE
Influenza B By PCR: NEGATIVE

## 2017-12-03 LAB — CBC
HCT: 42.1 % (ref 39.0–52.0)
Hemoglobin: 13.1 g/dL (ref 13.0–17.0)
MCH: 30.6 pg (ref 26.0–34.0)
MCHC: 31.1 g/dL (ref 30.0–36.0)
MCV: 98.4 fL (ref 80.0–100.0)
Platelets: 241 10*3/uL (ref 150–400)
RBC: 4.28 MIL/uL (ref 4.22–5.81)
RDW: 12.5 % (ref 11.5–15.5)
WBC: 12.9 10*3/uL — ABNORMAL HIGH (ref 4.0–10.5)
nRBC: 0 % (ref 0.0–0.2)

## 2017-12-03 LAB — URINALYSIS, ROUTINE W REFLEX MICROSCOPIC
Bilirubin Urine: NEGATIVE
Bilirubin Urine: NEGATIVE
Glucose, UA: NEGATIVE mg/dL
Glucose, UA: NEGATIVE mg/dL
Hgb urine dipstick: NEGATIVE
Ketones, ur: NEGATIVE mg/dL
Ketones, ur: NEGATIVE mg/dL
Leukocytes, UA: NEGATIVE
Leukocytes, UA: NEGATIVE
Nitrite: NEGATIVE
Nitrite: NEGATIVE
Protein, ur: NEGATIVE mg/dL
Protein, ur: NEGATIVE mg/dL
Specific Gravity, Urine: 1.006 (ref 1.005–1.030)
Specific Gravity, Urine: 1.012 (ref 1.005–1.030)
pH: 7 (ref 5.0–8.0)
pH: 8 (ref 5.0–8.0)

## 2017-12-03 LAB — BRAIN NATRIURETIC PEPTIDE
B Natriuretic Peptide: 73.3 pg/mL (ref 0.0–100.0)
B Natriuretic Peptide: 104.6 pg/mL — ABNORMAL HIGH (ref 0.0–100.0)

## 2017-12-03 LAB — BASIC METABOLIC PANEL
Anion gap: 8 (ref 5–15)
BUN: 21 mg/dL (ref 8–23)
CO2: 32 mmol/L (ref 22–32)
Calcium: 9.5 mg/dL (ref 8.9–10.3)
Chloride: 98 mmol/L (ref 98–111)
Creatinine, Ser: 1.15 mg/dL (ref 0.61–1.24)
GFR calc Af Amer: >60 mL/min (ref >60)
GFR calc non Af Amer: 56 mL/min — ABNORMAL LOW (ref >60)
Glucose, Bld: 124 mg/dL — ABNORMAL HIGH (ref 70–99)
Potassium: 4.3 mmol/L (ref 3.5–5.1)
Sodium: 138 mmol/L (ref 135–145)

## 2017-12-03 LAB — TROPONIN I: Troponin I: 0.05 ng/mL (ref <0.03)

## 2017-12-03 LAB — TSH: TSH: 2.212 u[IU]/mL (ref 0.350–4.500)

## 2017-12-03 LAB — I-STAT TROPONIN, ED: Troponin i, poc: 0.04 ng/mL (ref 0.00–0.08)

## 2017-12-03 LAB — VITAMIN B12: Vitamin B-12: 2932 pg/mL — ABNORMAL HIGH (ref 180–914)

## 2017-12-03 LAB — FOLATE: Folate: 14.9 ng/mL (ref >5.9)

## 2017-12-03 MED ORDER — UMECLIDINIUM BROMIDE 62.5 MCG/INH IN AEPB
1.0000 | INHALATION_SPRAY | Freq: Every day | RESPIRATORY_TRACT | Status: DC
  Filled 2017-12-03: qty 7

## 2017-12-03 MED ORDER — FLUTICASONE PROPIONATE 50 MCG/ACT NA SUSP
2.0000 | Freq: Every day | NASAL | Status: DC
  Filled 2017-12-03: qty 16

## 2017-12-03 MED ORDER — ALPRAZOLAM 0.25 MG PO TABS: 0.2500 mg | ORAL_TABLET | Freq: Two times a day (BID) | ORAL | Status: DC | PRN

## 2017-12-03 MED ORDER — ASPIRIN EC 81 MG PO TBEC
81.0000 mg | DELAYED_RELEASE_TABLET | Freq: Every day | ORAL | Status: DC
  Administered 2017-12-03 – 2017-12-04 (×2): 81 mg via ORAL
  Filled 2017-12-03 (×2): qty 1

## 2017-12-03 MED ORDER — ATORVASTATIN CALCIUM 10 MG PO TABS
20.0000 mg | ORAL_TABLET | Freq: Every day | ORAL | Status: DC
  Filled 2017-12-03: qty 2

## 2017-12-03 MED ORDER — SODIUM CHLORIDE 0.9 % IV BOLUS
500.0000 mL | Freq: Once | INTRAVENOUS | Status: AC
  Administered 2017-12-03: 500 mL via INTRAVENOUS

## 2017-12-03 MED ORDER — MOMETASONE FURO-FORMOTEROL FUM 200-5 MCG/ACT IN AERO
2.0000 | INHALATION_SPRAY | Freq: Two times a day (BID) | RESPIRATORY_TRACT | Status: DC
  Filled 2017-12-03: qty 8.8

## 2017-12-03 MED ORDER — FUROSEMIDE 20 MG PO TABS
20.0000 mg | ORAL_TABLET | Freq: Every day | ORAL | Status: DC
  Administered 2017-12-04: 20 mg via ORAL
  Filled 2017-12-03: qty 1

## 2017-12-03 MED ORDER — FINASTERIDE 5 MG PO TABS
5.0000 mg | ORAL_TABLET | Freq: Every morning | ORAL | Status: DC
  Administered 2017-12-04: 5 mg via ORAL
  Filled 2017-12-03: qty 1

## 2017-12-03 MED ORDER — DM-GUAIFENESIN ER 30-600 MG PO TB12: 1.0000 | ORAL_TABLET | Freq: Two times a day (BID) | ORAL | Status: DC | PRN

## 2017-12-03 MED ORDER — NITROGLYCERIN 0.4 MG SL SUBL: 0.4000 mg | SUBLINGUAL_TABLET | SUBLINGUAL | Status: DC | PRN

## 2017-12-03 MED ORDER — FAMOTIDINE 20 MG PO TABS
20.0000 mg | ORAL_TABLET | Freq: Two times a day (BID) | ORAL | Status: DC
  Administered 2017-12-03 – 2017-12-04 (×2): 20 mg via ORAL
  Filled 2017-12-03 (×2): qty 1

## 2017-12-03 MED ORDER — IOHEXOL 300 MG/ML  SOLN
100.0000 mL | Freq: Once | INTRAMUSCULAR | Status: AC | PRN
  Administered 2017-12-03: 100 mL via INTRAVENOUS

## 2017-12-03 MED ORDER — PANTOPRAZOLE SODIUM 40 MG PO TBEC
40.0000 mg | DELAYED_RELEASE_TABLET | Freq: Every day | ORAL | Status: DC
  Filled 2017-12-03: qty 1

## 2017-12-03 MED ORDER — PROSIGHT PO TABS
1.0000 | ORAL_TABLET | Freq: Every day | ORAL | Status: DC
  Administered 2017-12-03 – 2017-12-04 (×2): 1 via ORAL
  Filled 2017-12-03 (×2): qty 1

## 2017-12-03 MED ORDER — VITAMIN D 25 MCG (1000 UNIT) PO TABS
2000.0000 [IU] | ORAL_TABLET | Freq: Every day | ORAL | Status: DC
  Administered 2017-12-04: 2000 [IU] via ORAL
  Filled 2017-12-03: qty 2

## 2017-12-03 MED ORDER — ONDANSETRON HCL 4 MG/2ML IJ SOLN: 4.0000 mg | Freq: Four times a day (QID) | INTRAMUSCULAR | Status: DC | PRN

## 2017-12-03 MED ORDER — ACETAMINOPHEN 325 MG PO TABS: 650.0000 mg | ORAL_TABLET | ORAL | Status: DC | PRN

## 2017-12-03 MED ORDER — SPIRONOLACTONE 12.5 MG HALF TABLET
12.5000 mg | ORAL_TABLET | Freq: Every day | ORAL | Status: DC
  Administered 2017-12-04: 12.5 mg via ORAL
  Filled 2017-12-03: qty 1

## 2017-12-03 MED ORDER — HEPARIN SODIUM (PORCINE) 5000 UNIT/ML IJ SOLN
5000.0000 [IU] | Freq: Three times a day (TID) | INTRAMUSCULAR | Status: DC
  Filled 2017-12-03: qty 1

## 2017-12-03 MED ORDER — IPRATROPIUM-ALBUTEROL 0.5-2.5 (3) MG/3ML IN SOLN
3.0000 mL | Freq: Once | RESPIRATORY_TRACT | Status: AC
  Administered 2017-12-03: 3 mL via RESPIRATORY_TRACT
  Filled 2017-12-03: qty 3

## 2017-12-03 MED ORDER — METOPROLOL SUCCINATE 12.5 MG HALF TABLET
12.5000 mg | ORAL_TABLET | Freq: Every day | ORAL | Status: DC
  Administered 2017-12-04: 12.5 mg via ORAL
  Filled 2017-12-03 (×2): qty 1

## 2017-12-03 NOTE — H&P (Signed)
TRH H&P   Patient Demographics:    [name], is a 84 y.o. male  MRN: [id number]   DOB - Oct 23, 1929  Admit Date - 12/03/2017  Outpatient Primary MD for the patient is [name], MD  Referring MD/NP/PA: Dr Laverta Baltimore  Outpatient Specialists: cardiology Dr Daneen Schick, pulm Dr [name]  Patient coming from: Home  Chief Complaint  Patient presents with  . Chest Pain  . Shortness of Breath      HPI:    [name]  is a 84 y.o. male, hx of CABG2003, HTN, COPD on steroids w/ chronic resp failure,cor pulmonale on 2 L oxygen and nocturnal ventilation, uses trilogy machine,hyperlipidemia, PAD, history of carotid obstructive disease andRBBB. -She presents due to complaints of chest pain, midsternal, nonradiating, reports provoked today by high blood pressure, resolved with nitro, upon further questioning reports he has been having this chest pain for month, ports no nausea, no vomiting, he does report some dyspnea at baseline with no change, hemoptysis, reports his PCP increased his Lasix dose recently for worsening lower extremity edema. -Further question, patient reports that main reason he presents to ED progressive weakness, loss of appetite, weight loss, and report this has been going on for few weeks, when he has multiple falls, he has been seen by PT at home - in ED EKG was nonacute, he had negative troponins, currently he is chest pain-free, no significant lab abnormalities, I was called to admit for further evaluation    Review of systems:    In addition to the HPI above,  No Fever-chills, poor generalized weakness, fatigue, loss of appetite and energy No Headache, No changes with Vision or hearing, No problems swallowing food or Liquids, Ports chest pain, chronic, recurrent, denies cough, reports baseline shortness of breath on exertion due to COPD No Abdominal pain, No  Nausea or Vommitting, Bowel movements are regular, No Blood in stool or Urine, No dysuria, No new skin rashes or bruises, No new joints pains-aches,  No new weakness, tingling, numbness in any extremity, No recent weight gain or loss, No polyuria, polydypsia or polyphagia, No significant Mental Stressors.  A full 10 point Review of Systems was done, except as stated above, all other Review of Systems were negative.   With Past History of the following :    Past Medical History:  Diagnosis Date  . Arthritis    "generalized" (04/04/2017)  . Asthma   . Basal cell carcinoma    "left ear"  . BPH (benign prostatic hyperplasia)    severe; s/p multiple biopsies  . CAD (coronary artery disease)   . Carotid artery occlusion   . Chronic respiratory failure ([place])   . Chronic rhinitis   . COPD (chronic obstructive pulmonary disease) (HCC)    2L [place] O2  . Diastolic heart failure ([place]) 2019  . Dilation of biliary tract   . Elevated troponin  03/09/2017  . Gallstones   . GERD (gastroesophageal reflux disease)   . History of blood transfusion    "w/his CABG" (04/04/2017)  . History of kidney stones   . Hyperlipidemia   . Hypertension   . On home oxygen therapy    "~ 24/7" (04/04/2017)  . Peripheral vascular disease ([place])   . Pneumonia 2019  . Syncope and collapse   . Ureteral tumor 08/2015   had endoscopic procedure for evaluation, unable to reach for biopsy      Past Surgical History:  Procedure Laterality Date  . BASAL CELL CARCINOMA EXCISION Left    ear  . CARDIAC CATHETERIZATION     "prior to bypass"  . CATARACT EXTRACTION W/ INTRAOCULAR LENS  IMPLANT, BILATERAL Bilateral   . CORONARY ARTERY BYPASS GRAFT  10/2001   LIMA to LAD, SVG to OM1-2, SVG to RCA and PDA  . LITHOTRIPSY    . PROSTATE BIOPSY  Oct. 2014  . TONSILLECTOMY        Social History:     Social History   Tobacco Use  . Smoking status: Former Smoker    Packs/day: 2.00    Years: 33.00    Pack years:  66.00    Types: Cigarettes    Last attempt to quit: 03/25/1982    Years since quitting: 35.7  . Smokeless tobacco: Never Used  Substance Use Topics  . Alcohol use: Yes    Alcohol/week: 0.0 standard drinks    Comment: 1.5-3 oz daily in the past, minimal use in the last few months     Lives - at home  Mobility - independent    Family History :     Family History  Problem Relation Age of Onset  . Heart disease Mother   . Heart disease Father        Before age 54  . Breast cancer Mother   . Cancer Mother        Breast cancer  . Hypertension Mother   . Other Mother        AAA  and   Amputation  . Heart attack Father   . Stroke Brother        x3, still living   . Peripheral vascular disease Brother   . Heart disease Brother   . Hyperlipidemia Brother   . Hypertension Brother   . Heart disease Brother   . Allergies Brother       Home Medications:   Prior to Admission medications   Medication Sig Start Date End Date Taking? Authorizing Provider  ALPRAZolam Duanne Moron) 0.25 MG tablet One four times daily 10/29/17  Yes Tanda Rockers, MD  aspirin 81 MG tablet Take 81 mg by mouth daily.   Yes [provider]  atorvastatin (LIPITOR) 20 MG tablet TAKE 1 TABLET BY MOUTH EVERY DAY 05/24/16  Yes Belva Crome, MD  Cholecalciferol (VITAMIN D3) 2000 units TABS Take 2,000 Units by mouth daily.   Yes [provider]  dextromethorphan-guaiFENesin (MUCINEX DM) 30-600 MG 12hr tablet Take 1-2 tablets by mouth every 12 (twelve) hours as needed for cough (w/ flutter valve).   Yes [provider]  finasteride (PROSCAR) 5 MG tablet Take 5 mg by mouth every morning.   Yes [provider]  furosemide (LASIX) 20 MG tablet TAKE 1 TABLET BY MOUTH EVERY DAY. MAY TAKE ADDITIONAL TABLET IF NEEDED FOR SWELLING Patient taking differently: Take 20 mg by mouth daily. MAY TAKE ADDITIONAL TABLET IF NEEDED FOR SWELLING 10/23/17  Yes Tanda Rockers, MD  levalbuterol  (XOPENEX HFA) 45 MCG/ACT inhaler INHALE 2 PUFFS FOR PAIN EVERY 4 HOURS AS NEEDED FOR WHEEZING OR SHORTNESS OF BREATH Patient taking differently: Inhale 2 puffs into the lungs every 4 (four) hours as needed for wheezing or shortness of breath.  09/25/17  Yes Tanda Rockers, MD  metoprolol succinate (TOPROL-XL) 25 MG 24 hr tablet TAKE AT 12 NOON TIME Patient taking differently: Take 12.5 mg by mouth daily. TAKE AT 12 NOON TIME 08/30/17  Yes Isaiah Serge, NP  mometasone (NASONEX) 50 MCG/ACT nasal spray SHAKE LIQUID AND USE 2 SPRAYS IN EACH NOSTRIL DAILY Patient taking differently: Place 2 sprays into the nose daily.  12/25/16  Yes Tanda Rockers, MD  Multiple Vitamins-Minerals (PRESERVISION AREDS 2) CAPS Take 1 capsule by mouth daily.   Yes [provider]  nitroGLYCERIN (NITROSTAT) 0.4 MG SL tablet Place 0.4 mg under the tongue every 5 (five) minutes as needed for chest pain.   Yes [provider]  OXYGEN Inhale 2 L continuous into the lungs.    Yes [provider]  pantoprazole (PROTONIX) 40 MG tablet Take 40 mg by mouth daily.   Yes [provider]  predniSONE (DELTASONE) 10 MG tablet For flare of cough/wheezing may increase to 2 daily until better, then 1 daily 09/13/17  Yes Tanda Rockers, MD  ranitidine (ZANTAC) 150 MG tablet Take 150 mg by mouth at bedtime.  03/21/17  Yes [provider]  SPIRIVA RESPIMAT 2.5 MCG/ACT AERS Inhale 2 puffs into the lungs daily. 03/05/17  Yes [provider]  spironolactone (ALDACTONE) 25 MG tablet Take 0.5 tablets (12.5 mg total) by mouth daily. 07/02/17 06/27/18 Yes Belva Crome, MD  SYMBICORT 160-4.5 MCG/ACT inhaler INHALE 2 PUFFS INTO THE LUNGS EVERY MORNING AND ANOTHER 2 PUFFS ABOUT 12 HOURS LATER Patient taking differently: Inhale 2 puffs into the lungs 2 (two) times daily.  05/29/17  Yes Tanda Rockers, MD  doxycycline (VIBRA-TABS) 100 MG tablet Take 1 tablet (100 mg total) by mouth 2 (two) times  daily. Patient not taking: Reported on 12/03/2017 11/09/17   Martyn Ehrich, NP  levalbuterol [facility]) 0.63 MG/3ML nebulizer solution USE ONE VIAL VIA NEBULIZER EVERY 4 HOURS AS NEEDED FOR WHEEZING OR SHORTNESS OF BREATH Patient not taking: Reported on 12/03/2017 04/12/17   Tanda Rockers, MD     Allergies:     Allergies  Allergen Reactions  . Cefdinir Other (See Comments)    Severe Diarrhea  . Nitrofurantoin Swelling    Hand Swelling  . Sulfa Antibiotics Anaphylaxis and Swelling  . Sulfonamide Derivatives Swelling    REACTION: facial/tongue swelling  . Cefdinir Diarrhea    REACTION: diarrhea  . Ciprofloxacin Itching and Rash    Red itchy hands  . Nitrofurantoin Swelling    Swollen hands  . Doxycycline Other (See Comments)    Felt terrible  . Sertraline Other (See Comments)    jittery  . Ciprofloxacin Itching and Rash  . Levaquin [Levofloxacin] Itching and Rash  . Levaquin [Levofloxacin] Itching and Rash     Physical Exam:   Vitals  Blood pressure (!) 141/74, pulse 81, temperature 98 F (36.7 C), temperature source Oral, resp. rate 12, SpO2 97 %.   1. General  lying in bed in NAD,    2. Normal affect and insight, Not Suicidal or Homicidal, Awake Alert, Oriented X 3.  3. No F.N deficits, ALL C.Nerves Intact, Strength 5/5 all 4 extremities, Sensation  intact all 4 extremities, Plantars down going.  4. Ears and Eyes appear Normal, Conjunctivae clear, PERRLA. Moist Oral Mucosa.  5. Supple Neck, No JVD, No cervical lymphadenopathy appriciated, No Carotid Bruits.  6. Symmetrical Chest wall movement, Good air movement bilaterally, CTAB.  7. RRR, No Gallops, Rubs or Murmurs, No Parasternal Heave.+2 edema  8. Positive Bowel Sounds, Abdomen Soft, No tenderness, No organomegaly appriciated,No rebound -guarding or rigidity.  9.  No Cyanosis, Normal Skin Turgor, No Skin Rash or Bruise.  10. Good muscle tone,  joints appear normal , no effusions, Normal ROM.  11. No  Palpable Lymph Nodes in Neck or Axillae    Data Review:    CBC Recent Labs  Lab 12/03/17 1511  WBC 12.9*  HGB 13.1  HCT 42.1  PLT 241  MCV 98.4  MCH 30.6  MCHC 31.1  RDW 12.5   ------------------------------------------------------------------------------------------------------------------  Chemistries  Recent Labs  Lab 12/03/17 1511  NA 138  K 4.3  CL 98  CO2 32  GLUCOSE 124*  BUN 21  CREATININE 1.15  CALCIUM 9.5   ------------------------------------------------------------------------------------------------------------------ CrCl cannot be calculated (Unknown ideal weight.). ------------------------------------------------------------------------------------------------------------------ No results for input(s): TSH, T4TOTAL, T3FREE, THYROIDAB in the last 72 hours.  Invalid input(s): FREET3  Coagulation profile No results for input(s): INR, PROTIME in the last 168 hours. ------------------------------------------------------------------------------------------------------------------- No results for input(s): DDIMER in the last 72 hours. -------------------------------------------------------------------------------------------------------------------  Cardiac Enzymes No results for input(s): CKMB, TROPONINI, MYOGLOBIN in the last 168 hours.  Invalid input(s): CK ------------------------------------------------------------------------------------------------------------------    Component Value Date/Time   BNP 73.3 12/03/2017 1628     ---------------------------------------------------------------------------------------------------------------  Urinalysis    Component Value Date/Time   COLORURINE YELLOW 04/04/2017 0816   APPEARANCEUR CLEAR 04/04/2017 0816   LABSPEC 1.012 04/04/2017 0816   PHURINE 6.0 04/04/2017 0816   GLUCOSEU NEGATIVE 04/04/2017 0816   HGBUR NEGATIVE 04/04/2017 0816   BILIRUBINUR NEGATIVE 04/04/2017 0816   KETONESUR NEGATIVE  04/04/2017 0816   PROTEINUR NEGATIVE 04/04/2017 0816   NITRITE NEGATIVE 04/04/2017 0816   LEUKOCYTESUR NEGATIVE 04/04/2017 0816    ----------------------------------------------------------------------------------------------------------------   Imaging Results:    Dg Chest 2 View  Result Date: 12/03/2017 CLINICAL DATA:  2-3 days of severe lethargy and increase shortness of breath. History of previous CABG, COPD, peripheral vascular disease, former smoker. EXAM: CHEST - 2 VIEW COMPARISON:  PA and lateral chest x-ray of November 09, 2017 FINDINGS: The lungs are hyperinflated with hemidiaphragm flattening. There is no focal infiltrate. There is no pleural effusion. The heart and pulmonary vascularity are normal. The mediastinum is normal in width. There are post CABG changes. There is calcification in the wall of the aortic arch. The bony thorax exhibits no acute abnormality. There are degenerative changes of the left shoulder. IMPRESSION: COPD. No pneumonia, CHF, nor other acute cardiopulmonary abnormality. Previous CABG Electronically Signed   By: [place]  [place] M.D.   On: 12/03/2017 16:02    My personal review of EKG: Rhythm NSR, Rate  94 /min, QTc 462, no Acute ST changes   Assessment & Plan:    Active Problems:   BPH (benign prostatic hyperplasia)   Anxiety   Chest pain   CAD (coronary artery disease)   Chronic diastolic CHF (congestive heart failure) (HCC)   COPD (chronic obstructive pulmonary disease) (HCC)  Chest pain -patientreports is chronic, recurrent, been going on for few months, urologist aware of it, most likely related to stable angina is not typical chest pain, EKG nonacute, has negative troponins, and will be admitted on  telemetry, cycle cardiac enzymes, he had recent echo December with a preserved EF no regional wall motion abnormality, as well nuclear stress test with intermediate risk. -Follow on troponins, if he rules out, he can be discharged in a.m. with  recommendation to follow with cardiology as an outpatient. -Continue with statin, aspirin and beta-blockers -Chest pain most likely provoked by elevated blood pressure 198/120 at home per patient  Failure to thrive/generalized weakness -Does appear patient's main concern, and main reason for presentation to ED as his generalized weakness , which has been chronic, progressive over last few month . -Likely acutely worsened due to increased Lasix dose by his PCP . -We will obtain TSH, R74, folic acid , consult PT . -Patient report he was supposed to have CT abdomen pelvis done last month by urology to follow-up on her mass, he is to follow with [name], I will obtain CT abdomen pelvis during hospital stay .  Chronic diastolic CHF -Has +2 lower extremity edema, but appears to be at baseline, continue with home dose Lasix 20 mg oral daily  History of CAD -Reviewing cardiology note, he is with known occasional chest pain. -Continue with aspirin, statin, beta-blockers, and PRN nitro  COPD -Following with Dr. Marlene Lard, continue with home meds, no wheezing  Hyperlipidemia -Continue with statin  Hypertension -He is orthostatic, this is most likely due to overdiuresis, continue with home dose metoprolol  DVT Prophylaxis Heparin -  SCDs   AM Labs Ordered, also please review Full Orders  Family Communication: Admission, patients condition and plan of care including tests being ordered have been discussed with the patient and wifewho indicate understanding and agree with the plan and Code Status.  Code Status Full  Likely DC to  Home  Condition GUARDED    Consults called: none  Admission status: observation  Time spent in minutes : 55 minutes   Phillips Climes M.D on 12/03/2017 at 6:37 PM  Between 7am to 7pm - Pager - [phone]. After 7pm go to [email or web address] - password [facility]  [facility] - Office  [phone]

## 2017-12-03 NOTE — ED Notes (Signed)
Pt aware of need for urine, urinal at bedside

## 2017-12-03 NOTE — ED Triage Notes (Addendum)
Cp   About 215 took a nitro and he has been more sob and he felt his bp was high  Has been dizzy, weak and tired , worsening leg edema, pt is on home o2   2 l [place]

## 2017-12-03 NOTE — ED Provider Notes (Signed)
Emergency Department Provider Note   I have reviewed the triage vital signs and the nursing notes.   HISTORY  Chief Complaint Chest Pain and Shortness of Breath   HPI [name] is a 84 y.o. male with PMH of COPD, CAD, DM, HLD, HTN, and GERD presents to the emergency department for evaluation of generalized weakness progressively worsening over the past 4 days.  The patient's had some associated right shoulder discomfort radiating to the chest which she assumed was musculoskeletal.  That discomfort stopped at this morning.  He has noticed that his fatigue is worsening significantly and later in the morning developed some central chest heaviness with shortness of breath.  Family took blood pressure noticed it to be with systolic in the 497W.  He denies any vertigo.  He has had worsening swelling in his lower extremities despite increasing his Lasix to 40 mg daily over the past several days.  No fevers or chills.  Patient took nitroglycerin prior to EMS arrival which improved the patient's chest discomfort.  He denies any unilateral weakness or numbness.   Past Medical History:  Diagnosis Date  . Arthritis    "generalized" (04/04/2017)  . Asthma   . Basal cell carcinoma    "left ear"  . BPH (benign prostatic hyperplasia)    severe; s/p multiple biopsies  . CAD (coronary artery disease)   . Carotid artery occlusion   . Chronic respiratory failure ([place])   . Chronic rhinitis   . COPD (chronic obstructive pulmonary disease) (HCC)    2L [place] O2  . Diastolic heart failure ([place]) 2019  . Dilation of biliary tract   . Elevated troponin 03/09/2017  . Gallstones   . GERD (gastroesophageal reflux disease)   . History of blood transfusion    "w/his CABG" (04/04/2017)  . History of kidney stones   . Hyperlipidemia   . Hypertension   . On home oxygen therapy    "~ 24/7" (04/04/2017)  . Peripheral vascular disease ([place])   . Pneumonia 2019  . Syncope and collapse   . Ureteral tumor 08/2015     had endoscopic procedure for evaluation, unable to reach for biopsy    Patient Active Problem List   Diagnosis Date Noted  . Severe major depression without psychotic features ([place]) 09/22/2017  . Generalized anxiety disorder 09/22/2017  . Palliative care encounter 05/10/2017  . Weakness 04/04/2017  . Chest pain 03/24/2017  . AV block, [place] 1 03/24/2017  . CAD (coronary artery disease) 03/24/2017  . Chronic diastolic CHF (congestive heart failure) ([place]) 03/24/2017  . COPD (chronic obstructive pulmonary disease) ([place]) 03/24/2017  . NSTEMI (non-ST elevated myocardial infarction) ([place])   . Elevated troponin 03/09/2017  . Acute on chronic diastolic heart failure ([place])   . Tachycardia 03/08/2017  . Influenza A 02/04/2017  . Cor pulmonale, chronic (HCC)/ clinical dx  09/13/2016  . Orthostatic hypotension 09/26/2015  . Angina at rest [facility]) 09/24/2015  . BPH (benign prostatic hyperplasia) 09/13/2015  . GERD (gastroesophageal reflux disease) 09/13/2015  . Anxiety 09/13/2015  . Sinusitis, chronic 09/09/2015  . DOE (dyspnea on exertion) 04/15/2015  . Acute heart failure ([place]) 03/18/2015  . Right calf pain 06/18/2014  . Coronary artery disease involving coronary bypass graft of native heart with angina pectoris ([place])   . Peripheral vascular disease ([place])   . Essential hypertension   . Obstructive sleep apnea   . Rhinitis, chronic 01/29/2007  . COPD   GOLD IV/ 02/steroid dep  01/29/2007  .  Chronic respiratory failure with hypoxia and hypercapnia ([place]) 01/29/2007  . Hyperlipidemia     Past Surgical History:  Procedure Laterality Date  . BASAL CELL CARCINOMA EXCISION Left    ear  . CARDIAC CATHETERIZATION     "prior to bypass"  . CATARACT EXTRACTION W/ INTRAOCULAR LENS  IMPLANT, BILATERAL Bilateral   . CORONARY ARTERY BYPASS GRAFT  10/2001   LIMA to LAD, SVG to OM1-2, SVG to RCA and PDA  . LITHOTRIPSY    . PROSTATE BIOPSY  Oct. 2014  . TONSILLECTOMY      Allergies Cefdinir;  Nitrofurantoin; Sulfa antibiotics; Sulfonamide derivatives; Cefdinir; Ciprofloxacin; Nitrofurantoin; Doxycycline; Sertraline; Ciprofloxacin; Levaquin [levofloxacin]; and Levaquin [levofloxacin]  Family History  Problem Relation Age of Onset  . Heart disease Mother   . Heart disease Father        Before age 31  . Breast cancer Mother   . Cancer Mother        Breast cancer  . Hypertension Mother   . Other Mother        AAA  and   Amputation  . Heart attack Father   . Stroke Brother        x3, still living   . Peripheral vascular disease Brother   . Heart disease Brother   . Hyperlipidemia Brother   . Hypertension Brother   . Heart disease Brother   . Allergies Brother     Social History Social History   Tobacco Use  . Smoking status: Former Smoker    Packs/day: 2.00    Years: 33.00    Pack years: 66.00    Types: Cigarettes    Last attempt to quit: 03/25/1982    Years since quitting: 35.7  . Smokeless tobacco: Never Used  Substance Use Topics  . Alcohol use: Yes    Alcohol/week: 0.0 standard drinks    Comment: 1.5-3 oz daily in the past, minimal use in the last few months  . Drug use: No    Review of Systems  Constitutional: No fever/chills. Positive generalized weakness.  Eyes: No visual changes. ENT: No sore throat. Cardiovascular: Positive chest pain and worsening LE edema.  Respiratory: Positive shortness of breath. Gastrointestinal: No abdominal pain. No nausea, no vomiting.  No diarrhea.  No constipation. Genitourinary: Negative for dysuria. Musculoskeletal: Negative for back pain. Skin: Negative for rash. Neurological: Negative for headaches, focal weakness or numbness.  10-point ROS otherwise negative.  ____________________________________________   PHYSICAL EXAM:  VITAL SIGNS: ED Triage Vitals  Enc Vitals Group     BP 12/03/17 1500 140/75     Pulse Rate 12/03/17 1500 95     Resp 12/03/17 1500 20     Temp 12/03/17 1500 98 F (36.7 C)     Temp  Source 12/03/17 1500 Oral     SpO2 12/03/17 1500 97 %     Pain Score 12/03/17 1457 5   Constitutional: Alert and oriented. Well appearing and in no acute distress. Eyes: Conjunctivae are normal.  Head: Atraumatic. Nose: No congestion/rhinnorhea. Mouth/Throat: Mucous membranes are dry.  Neck: No stridor.  Cardiovascular: Normal rate, regular rhythm. Good peripheral circulation. Grossly normal heart sounds.   Respiratory: Normal respiratory effort.  No retractions. Lungs with faint end-expiratory wheezing.  Gastrointestinal: Soft and nontender. No distention.  Musculoskeletal: No lower extremity tenderness with trace pitting edema bilaterally. No gross deformities of extremities. Neurologic:  Normal speech and language. No gross focal neurologic deficits are appreciated.  Skin:  Skin is warm, dry and intact.  No rash noted.  ____________________________________________   LABS (all labs ordered are listed, but only abnormal results are displayed)  Labs Reviewed  BASIC METABOLIC PANEL - Abnormal; Notable for the following components:      Result Value   Glucose, Bld 124 (*)    GFR calc non Af Amer 56 (*)    All other components within normal limits  CBC - Abnormal; Notable for the following components:   WBC 12.9 (*)    All other components within normal limits  BRAIN NATRIURETIC PEPTIDE  URINALYSIS, ROUTINE W REFLEX MICROSCOPIC  INFLUENZA PANEL BY PCR (TYPE A & B)  BRAIN NATRIURETIC PEPTIDE  I-STAT TROPONIN, ED   ____________________________________________  EKG   EKG Interpretation  Date/Time:  Monday December 03 2017 16:00:11 EST Ventricular Rate:  94 PR Interval:  144 QRS Duration: 128 QT Interval:  370 QTC Calculation: 462 R Axis:   106 Text Interpretation:  Normal sinus rhythm Right bundle branch block Abnormal ECG No STEMI. Similar to March 2019 tracing.  Confirmed by Nanda Quinton [id number]) on 12/03/2017 4:52:44 PM        ____________________________________________  RADIOLOGY  Dg Chest 2 View  Result Date: 12/03/2017 CLINICAL DATA:  2-3 days of severe lethargy and increase shortness of breath. History of previous CABG, COPD, peripheral vascular disease, former smoker. EXAM: CHEST - 2 VIEW COMPARISON:  PA and lateral chest x-ray of November 09, 2017 FINDINGS: The lungs are hyperinflated with hemidiaphragm flattening. There is no focal infiltrate. There is no pleural effusion. The heart and pulmonary vascularity are normal. The mediastinum is normal in width. There are post CABG changes. There is calcification in the wall of the aortic arch. The bony thorax exhibits no acute abnormality. There are degenerative changes of the left shoulder. IMPRESSION: COPD. No pneumonia, CHF, nor other acute cardiopulmonary abnormality. Previous CABG Electronically Signed   By: Chrisangel  [place] M.D.   On: 12/03/2017 16:02    ____________________________________________   PROCEDURES  Procedure(s) performed:   Procedures  None  ____________________________________________   INITIAL IMPRESSION / ASSESSMENT AND PLAN / ED COURSE  Pertinent labs & imaging results that were available during my care of the patient were reviewed by me and considered in my medical decision making (see chart for details).  Patient presents to the emergency department for evaluation of generalized fatigue progressively worsening over the past 3 to 4 days with associated shortness of breath, chest pressure.  Patient has had more persistent right shoulder and chest pain which does sound somewhat musculoskeletal but considered ACS.  No chest pressure or pain at this time.  No fevers or chills.  Labs from triage reviewed which showed normal troponin and mild leukocytosis.  Patient has had some nasal drainage and fatigue.  Will send flu and add BNP. CP did improve with nitro.   05:10 PM Patient with positive orthostatic hypotension. No evidence of  overt volume overload. Possibly over-diuresis. Plan for gentle IVF bolus. May contribute to patient's weakness. No anemia. Mild leukocytosis. No clear source for infection. No AKI. Flu and BNP pending.   Discussed patient's case with Hospitalist to request admission. Patient and family (if present) updated with plan. Care transferred to Hospitalist service.  I reviewed all nursing notes, vitals, pertinent old records, EKGs, labs, imaging (as available).  ____________________________________________  FINAL CLINICAL IMPRESSION(S) / ED DIAGNOSES  Final diagnoses:  Precordial chest pain  Orthostatic dizziness  Generalized weakness    MEDICATIONS GIVEN DURING THIS VISIT:  Medications  sodium chloride 0.9 %  bolus 500 mL (500 mLs Intravenous New Bag/Given 12/03/17 1721)  ipratropium-albuterol (DUONEB) 0.5-2.5 (3) MG/3ML nebulizer solution 3 mL (3 mLs Nebulization Given 12/03/17 1704)    Note:  This document was prepared using Dragon voice recognition software and may include unintentional dictation errors.  Nanda Quinton, MD Emergency Medicine    Beldon Nowling, [facility], MD 12/03/17 [phone]

## 2017-12-03 NOTE — Telephone Encounter (Signed)
Called patient, unable to reach left message to give [place] a call back. Called Wells Guiles from [facility]. She stated that they started the care for him over the weekend. Patient has increased edema in his lower extremities. Patients BP was 130/65 with a pulse of 90. Patient is on 2 liters of o2 but unable to check sats due to losing his pulse ox. Patient has increased SOB and congestion. Patient is very weak.    Called and spoke with patients wife she stated that patient looks over all terrible and he has trouble getting his breath. Patients wife stated that she is worried that he cannot function. Patients wife stated that she will be taking patient over to the ER for evaluation. I will send this to MW as an [place].

## 2017-12-04 DIAGNOSIS — R0789 Other chest pain: Secondary | ICD-10-CM | POA: Diagnosis not present

## 2017-12-04 DIAGNOSIS — R42 Dizziness and giddiness: Secondary | ICD-10-CM | POA: Diagnosis not present

## 2017-12-04 DIAGNOSIS — R531 Weakness: Secondary | ICD-10-CM | POA: Diagnosis not present

## 2017-12-04 DIAGNOSIS — R079 Chest pain, unspecified: Secondary | ICD-10-CM | POA: Diagnosis not present

## 2017-12-04 LAB — TROPONIN I
Troponin I: 0.05 ng/mL (ref <0.03)
Troponin I: 0.05 ng/mL (ref <0.03)

## 2017-12-04 LAB — MAGNESIUM: Magnesium: 2.2 mg/dL (ref 1.7–2.4)

## 2017-12-04 MED ORDER — DICLOFENAC SODIUM 1 % TD GEL: 2.0000 g | Freq: Four times a day (QID) | TRANSDERMAL | 0 refills | Status: DC

## 2017-12-04 MED ORDER — DICLOFENAC SODIUM 1 % TD GEL
2.0000 g | Freq: Four times a day (QID) | TRANSDERMAL | Status: DC
  Administered 2017-12-04: 2 g via TOPICAL
  Filled 2017-12-04: qty 100

## 2017-12-04 MED ORDER — PREDNISONE 10 MG PO TABS
10.0000 mg | ORAL_TABLET | Freq: Two times a day (BID) | ORAL | Status: DC
  Administered 2017-12-04: 10 mg via ORAL
  Filled 2017-12-04: qty 1

## 2017-12-04 NOTE — Evaluation (Signed)
Physical Therapy Evaluation Patient Details Name: [name] MRN: [id number] DOB: Oct 27, 1929 Today's Date: 12/04/2017   History of Present Illness  Pt is an 84 y.o. male presenting with chest pain and an episode of weakness. CT no acute findings, CXR no acute findings. PMH: COPD, PVD, HTN, Syncope, HLD, CAD, Pneumonia, Arthritis, CABG 03', orthostatic hypotension.  Clinical Impression  Pt admitted with above diagnosis. Pt presents with cognitive impairments, generalized weakness and lightheadedness limiting his ability to transfer and amb independently and safely. Pt amb min guard 50 ft with RW and 2L [place], increased lightheadedness, SpO2 90% and BP 134/80 s/p session. Pt educated on safe mobility with RW. Pt will benefit from skilled PT to increase his independence and safety with mobility to allow discharge to home with home health and decreased caregiver burden.      Follow Up Recommendations Home health PT;Supervision for mobility/OOB    Equipment Recommendations  None recommended by PT    Recommendations for Other Services       Precautions / Restrictions Precautions Precautions: Fall Restrictions Weight Bearing Restrictions: No      Mobility  Bed Mobility Overal bed mobility: Needs Assistance Bed Mobility: Supine to Sit     Supine to sit: HOB elevated;Supervision     General bed mobility comments: don/doff socks in bed, lightheadedness that decreased with more time in sitting. EOB SpO2 91% 2L [place]   Transfers Overall transfer level: Needs assistance Equipment used: None Transfers: Sit to/from Stand Sit to Stand: Min guard         General transfer comment: lightheadedness in standing, BP 115/73, 2L [place]  Ambulation/Gait Ambulation/Gait assistance: Min guard Gait Distance (Feet): 50 Feet Assistive device: Rolling walker (2 wheeled) Gait Pattern/deviations: Decreased stride length;Step-through pattern Gait velocity: decreased from baseline   General Gait  Details: VC to stay close to walker, increased lightheadedness, BP in sitting after amb 134/80 SpO2 90% 2L [place]  Stairs            Wheelchair Mobility    Modified Rankin (Stroke Patients Only)       Balance Overall balance assessment: Needs assistance Sitting-balance support: Feet supported;No upper extremity supported Sitting balance-Leahy Scale: Good Sitting balance - Comments: Don/doff socks with both hands in sitting   Standing balance support: No upper extremity supported Standing balance-Leahy Scale: Fair Standing balance comment: Maintain static balance with no UE support, accepts mild challenge, dynamic balance required RW                             Pertinent Vitals/Pain Pain Assessment: No/denies pain    Home Living Family/patient expects to be discharged to:: Private residence Living Arrangements: Spouse/significant other Available Help at Discharge: Family;Available 24 hours/day Type of Home: House Home Access: Level entry     Home Layout: Multi-level Home Equipment: Cane - single point;Shower Engineer, civil (consulting) - 2 wheels Additional Comments: O2 2L    Prior Function Level of Independence: Independent         Comments: Has been falling at home - HHPT coming out. Wife reports he has been steadily declining for the past 3 weeks. She states that immediately before coming in pt was able to bathe and dress himself but he was doing it "poorly" and refused her help or anyone else's help.      Hand Dominance   Dominant Hand: Right    Extremity/Trunk Assessment   Upper Extremity Assessment Upper Extremity Assessment: Overall [facility]  for tasks assessed    Lower Extremity Assessment Lower Extremity Assessment: RLE deficits/detail;LLE deficits/detail RLE Deficits / Details: Hip flexion 4/5, quad 5/5, hamstring 4/5, DF 5/5, PF 5/5 LLE Deficits / Details: Hip flexion 4/5, quad 5/5, hamstring 4/5, DF 5/5, PF 5/5, decreased sensation on  outside of L foot (edema) LLE Sensation: decreased light touch       Communication   Communication: HOH  Cognition Arousal/Alertness: Awake/alert Behavior During Therapy: WFL for tasks assessed/performed Overall Cognitive Status: Impaired/Different from baseline Area of Impairment: Awareness;Following commands;Safety/judgement;Memory                     Memory: Decreased short-term memory Following Commands: Follows one step commands consistently;Follows multi-step commands inconsistently Safety/Judgement: Decreased awareness of safety Awareness: Anticipatory   General Comments: A&Ox3, when asked why he was in the hospital stated it was because he made a mistake listening to the nurse who told him to come here. Could not elaborate on episode of weakness. Later, demonstrated anticipatory awareness stating he needed to wait for the lightheadedness to go away before he starts walking because his BP drops      General Comments General comments (skin integrity, edema, etc.): 1+ pedal edema in L>R; Wife present throughout session     Exercises     Assessment/Plan    PT Assessment Patient needs continued PT services  PT Problem List Decreased strength;Decreased mobility;Decreased safety awareness;Decreased activity tolerance;Decreased cognition;Cardiopulmonary status limiting activity;Decreased balance;Decreased knowledge of use of DME       PT Treatment Interventions DME instruction;Therapeutic activities;Cognitive remediation;Gait training;Therapeutic exercise;Patient/family education;Stair training;Balance training;Functional mobility training;Neuromuscular re-education    PT Goals (Current goals can be found in the Care Plan section)  Acute Rehab PT Goals Patient Stated Goal: return home PT Goal Formulation: With patient Time For Goal Achievement: 12/18/17    Frequency Min 3X/week   Barriers to discharge        Co-evaluation               AM-PAC PT "6  Clicks" Mobility  Outcome Measure Help needed turning from your back to your side while in a flat bed without using bedrails?: None Help needed moving from lying on your back to sitting on the side of a flat bed without using bedrails?: None Help needed moving to and from a bed to a chair (including a wheelchair)?: A Little Help needed standing up from a chair using your arms (e.g., wheelchair or bedside chair)?: A Little Help needed to walk in hospital room?: A Little Help needed climbing 3-5 steps with a railing? : A Lot 6 Click Score: 19    End of Session Equipment Utilized During Treatment: Gait belt;Oxygen Activity Tolerance: Patient tolerated treatment well Patient left: in bed;with bed alarm set;with call bell/phone within reach;with family/visitor present Nurse Communication: Mobility status PT Visit Diagnosis: Unsteadiness on feet (R26.81);History of falling (Z91.81);Muscle weakness (generalized) (M62.81)    Time: 4696-2952 PT Time Calculation (min) (ACUTE ONLY): 29 min   Charges:   PT Evaluation $PT Eval Moderate Complexity: 1 Mod PT Treatments $Gait Training: 8-22 mins   Gilda Crease, SPT Acute Rehab Services [place] 12/04/2017, 10:22 AM

## 2017-12-04 NOTE — Care Management Obs Status (Deleted)
[place] NOTIFICATION   Patient Details  Name: FAUST THORINGTON MRN: [id number] Date of Birth: 03/26/1929   Medicare Observation Status Notification Given:  Yes    Georgeanna Lea, RN 12/04/2017, 3:17 PM

## 2017-12-04 NOTE — Care Management Note (Signed)
Case Management Note  Patient Details  Name: [name] MRN: [id number] Date of Birth: [birth year]  Subjective/Objective:  84 yo male presented with chest pain/SOB.                  Action/Plan: CM met with patient/spouse to discuss dispositional needs. Patient lives at home, independent with ADLs, has continuous home O2 @ 2LPM, walker, cane. PCP verified as: [name]; pharmacy of choice: [facility], [place]. HHPT recommended with HH preference provided, [facility] selected. [place] referral given to [facility] liaison; AVS updated. Patient requesting portable O2 tank for transport home, with [facility] liaison to deliver prior to discharge. Patients spouse will provide transportation home. No further needs from CM.   Expected Discharge Date:  12/04/17               Expected Discharge Plan:  [place]  In-House Referral:  NA  Discharge planning Services  CM Consult  Post Acute Care Choice:  Home Health Choice offered to:  Patient, Spouse  DME Arranged:  N/A DME Agency:  NA  HH Arranged:  RN, PT, Disease Management, OT [place] Agency:  [place]  Status of Service:  Completed, signed off  If discussed at [place] of Stay Meetings, dates discussed:    Additional Comments:  Midge Minium RN, BSN, NCM-BC, ACM-RN [phone] 12/04/2017, 3:46 PM

## 2017-12-04 NOTE — Discharge Summary (Addendum)
Physician Discharge Summary  [name] MWN:[id number] DOB: 1929-12-02 DOA: 12/03/2017  PCP: [name], MD  Admit date: 12/03/2017 Discharge date: 12/04/2017  Admitted From: home Discharge disposition: home   Recommendations for Outpatient Follow-Up:   1. Home health 2. Humidify O2 3. Continue talks with palliative care 4. protonix in AM And zantac in AM     Discharge Diagnosis:   Active Problems:   BPH (benign prostatic hyperplasia)   Anxiety   Chest pain   CAD (coronary artery disease)   Chronic diastolic CHF (congestive heart failure) (HCC)   COPD (chronic obstructive pulmonary disease) ([place])    Discharge Condition: Improved.  Diet recommendation: Low sodium, heart healthy  Wound care: None.  Code status: Full.   History of Present Illness:   [name]  is a 84 y.o. male, hx of CABG2003, HTN, COPD on steroids w/ chronic resp failure,cor pulmonale on 2 L oxygen and nocturnal ventilation, uses trilogy machine,hyperlipidemia, PAD, history of carotid obstructive disease andRBBB. -She presents due to complaints of chest pain, midsternal, nonradiating, reports provoked today by high blood pressure, resolved with nitro, upon further questioning reports he has been having this chest pain for month, ports no nausea, no vomiting, he does report some dyspnea at baseline with no change, hemoptysis, reports his PCP increased his Lasix dose recently for worsening lower extremity edema. -Further question, patient reports that main reason he presents to ED progressive weakness, loss of appetite, weight loss, and report this has been going on for few weeks, when he has multiple falls, he has been seen by PT at home - in ED EKG was nonacute, he had negative troponins, currently he is chest pain-free, no significant lab abnormalities, I was called to admit for further evaluation   Hospital Course by Problem:    Chest pain -actually shoulder and back  muscle pain -improved with voltaren gel  Failure to thrive/generalized weakness -Likely acutely worsened due to increased Lasix dose by his PCP . -We will obtain TSH, Q03, folic acid , consult PT . -CT scan abd/pelvis- stable  Chronic diastolic CHF -Has +2 lower extremity edema, but appears to be at baseline, continue with home dose Lasix 20 mg oral daily  History of CAD -Reviewing cardiology note, he is with known occasional chest pain. -Continue with aspirin, statin, beta-blockers, and PRN nitro  COPD -Following with Pulm, continue with home meds, no wheezing  Hyperlipidemia -Continue with statin  Hypertension -He is orthostatic, this is most likely due to overdiuresis, continue with home dose metoprolol   Medical Consultants:      Discharge Exam:   Vitals:   12/04/17 1256 12/04/17 1641  BP: 114/72 120/72  Pulse: 93 92  Resp:    Temp:  98 F (36.7 C)  SpO2: 91% 97%   Vitals:   12/04/17 0924 12/04/17 1154 12/04/17 1256 12/04/17 1641  BP:  (!) 154/100 114/72 120/72  Pulse:  92 93 92  Resp:  (!) 24    Temp:  97.6 F (36.4 C)  98 F (36.7 C)  TempSrc:  Oral  Oral  SpO2: 90% 98% 91% 97%  Weight:      Height:        General exam: Appears calm and comfortable. Chronically ill appearing  The results of significant diagnostics from this hospitalization (including imaging, microbiology, ancillary and laboratory) are listed below for reference.     Procedures and Diagnostic Studies:   Dg Chest 2 View  Result Date:  12/03/2017 CLINICAL DATA:  2-3 days of severe lethargy and increase shortness of breath. History of previous CABG, COPD, peripheral vascular disease, former smoker. EXAM: CHEST - 2 VIEW COMPARISON:  PA and lateral chest x-ray of November 09, 2017 FINDINGS: The lungs are hyperinflated with hemidiaphragm flattening. There is no focal infiltrate. There is no pleural effusion. The heart and pulmonary vascularity are normal. The mediastinum is normal  in width. There are post CABG changes. There is calcification in the wall of the aortic arch. The bony thorax exhibits no acute abnormality. There are degenerative changes of the left shoulder. IMPRESSION: COPD. No pneumonia, CHF, nor other acute cardiopulmonary abnormality. Previous CABG Electronically Signed   By: R[place]  Martinique M.D.   On: 12/03/2017 16:02   Ct Abdomen Pelvis W Contrast  Result Date: 12/03/2017 CLINICAL DATA:  Abdominal pain. Weight loss. Anorexia. Ureteral tumor. EXAM: CT ABDOMEN AND PELVIS WITH CONTRAST TECHNIQUE: Multidetector CT imaging of the abdomen and pelvis was performed using the standard protocol following bolus administration of intravenous contrast. CONTRAST:  151mL OMNIPAQUE IOHEXOL 300 MG/ML  SOLN COMPARISON:  05/10/2016 FINDINGS: Lower Chest: No acute findings. Hepatobiliary: No hepatic masses identified. Gallbladder is unremarkable. Pancreas:  No mass or inflammatory changes. Spleen: Within normal limits in size and appearance. Adrenals/Urinary Tract: Adrenal glands and kidneys are normal in appearance. No evidence of hydronephrosis. Abnormal enhancing soft tissue density is again seen in the distal left ureter, without significant change since previous study, consistent with ureteral tumor. Bladder is unremarkable in its in appearance except for extrinsic mass effect by enlarged prostate. Stomach/Bowel: No evidence of obstruction, inflammatory process or abnormal fluid collections. Diverticulosis is seen mainly involving the sigmoid colon, however there is no evidence of diverticulitis. Vascular/Lymphatic: No pathologically enlarged lymph nodes. No abdominal aortic aneurysm. Aortic atherosclerosis. Reproductive: Stable moderately enlarged prostate gland, with mass effect on bladder base. Other:  None. Musculoskeletal:  No suspicious bone lesions identified. IMPRESSION: No acute findings. Stable enhancing soft tissue density in the distal left ureter, consistent with known  ureteral tumor. No metastatic disease identified. Stable moderately enlarged prostate gland, with mass effect on bladder base. Colonic diverticulosis, without radiographic evidence of diverticulitis. Electronically Signed   By: Earle Gell M.D.   On: 12/03/2017 22:14     Labs:   Basic Metabolic Panel: Recent Labs  Lab 12/03/17 1511  NA 138  K 4.3  CL 98  CO2 32  GLUCOSE 124*  BUN 21  CREATININE 1.15  CALCIUM 9.5   GFR Estimated Creatinine Clearance: 37.6 mL/min (by C-G formula based on SCr of 1.15 mg/dL). Liver Function Tests: No results for input(s): AST, ALT, ALKPHOS, BILITOT, PROT, ALBUMIN in the last 168 hours. No results for input(s): LIPASE, AMYLASE in the last 168 hours. No results for input(s): AMMONIA in the last 168 hours. Coagulation profile No results for input(s): INR, PROTIME in the last 168 hours.  CBC: Recent Labs  Lab 12/03/17 1511  WBC 12.9*  HGB 13.1  HCT 42.1  MCV 98.4  PLT 241   Cardiac Enzymes: Recent Labs  Lab 12/03/17 2029 12/04/17 0237 12/04/17 0658  TROPONINI 0.05* 0.05* 0.05*   BNP: Invalid input(s): POCBNP CBG: No results for input(s): GLUCAP in the last 168 hours. D-Dimer No results for input(s): DDIMER in the last 72 hours. Hgb A1c No results for input(s): HGBA1C in the last 72 hours. Lipid Profile No results for input(s): CHOL, HDL, LDLCALC, TRIG, CHOLHDL, LDLDIRECT in the last 72 hours. Thyroid function studies Recent Labs  12/03/17 2029  TSH 2.212   Anemia work up Recent Labs    12/03/17 2029  VITAMINB12 2,932*  FOLATE 14.9   Microbiology No results found for this or any previous visit (from the past 240 hour(s)).   Discharge Instructions:   Discharge Instructions    Diet - low sodium heart healthy   Complete by:  As directed    Discharge instructions   Complete by:  As directed    Home health RN, respiratory therapy for humidified O2, PT Would take protonix in AM and zantac in PM   Increase activity  slowly   Complete by:  As directed      Allergies as of 12/04/2017      Reactions   Cefdinir Other (See Comments)   Severe Diarrhea   Nitrofurantoin Swelling   Hand Swelling   Sulfa Antibiotics Anaphylaxis, Swelling   Sulfonamide Derivatives Swelling   REACTION: facial/tongue swelling   Cefdinir Diarrhea   REACTION: diarrhea   Ciprofloxacin Itching, Rash   Red itchy hands   Nitrofurantoin Swelling   Swollen hands   Doxycycline Other (See Comments)   Felt terrible   Sertraline Other (See Comments)   jittery   Ciprofloxacin Itching, Rash   Levaquin [levofloxacin] Itching, Rash   Levaquin [levofloxacin] Itching, Rash      Medication List    STOP taking these medications   doxycycline 100 MG tablet Commonly known as:  VIBRA-TABS   levalbuterol 0.63 MG/3ML nebulizer solution Commonly known as:  XOPENEX     TAKE these medications   ALPRAZolam 0.25 MG tablet Commonly known as:  XANAX One four times daily   aspirin 81 MG tablet Take 81 mg by mouth daily.   atorvastatin 20 MG tablet Commonly known as:  LIPITOR TAKE 1 TABLET BY MOUTH EVERY DAY   dextromethorphan-guaiFENesin 30-600 MG 12hr tablet Commonly known as:  MUCINEX DM Take 1-2 tablets by mouth every 12 (twelve) hours as needed for cough (w/ flutter valve).   diclofenac sodium 1 % Gel Commonly known as:  VOLTAREN Apply 2 g topically 4 (four) times daily.   finasteride 5 MG tablet Commonly known as:  PROSCAR Take 5 mg by mouth every morning.   furosemide 20 MG tablet Commonly known as:  LASIX TAKE 1 TABLET BY MOUTH EVERY DAY. MAY TAKE ADDITIONAL TABLET IF NEEDED FOR SWELLING What changed:    how much to take  how to take this  when to take this  additional instructions   levalbuterol 45 MCG/ACT inhaler Commonly known as:  XOPENEX HFA INHALE 2 PUFFS FOR PAIN EVERY 4 HOURS AS NEEDED FOR WHEEZING OR SHORTNESS OF BREATH What changed:    how much to take  how to take this  when to take  this  reasons to take this  additional instructions   metoprolol succinate 25 MG 24 hr tablet Commonly known as:  TOPROL-XL TAKE AT 12 NOON TIME What changed:    how much to take  how to take this  when to take this   mometasone 50 MCG/ACT nasal spray Commonly known as:  NASONEX SHAKE LIQUID AND USE 2 SPRAYS IN EACH NOSTRIL DAILY What changed:  See the new instructions.   nitroGLYCERIN 0.4 MG SL tablet Commonly known as:  NITROSTAT Place 0.4 mg under the tongue every 5 (five) minutes as needed for chest pain.   OXYGEN Inhale 2 L continuous into the lungs.   pantoprazole 40 MG tablet Commonly known as:  PROTONIX Take 40 mg by  mouth daily.   predniSONE 10 MG tablet Commonly known as:  DELTASONE For flare of cough/wheezing may increase to 2 daily until better, then 1 daily   PRESERVISION AREDS 2 Caps Take 1 capsule by mouth daily.   ranitidine 150 MG tablet Commonly known as:  ZANTAC Take 150 mg by mouth at bedtime.   SPIRIVA RESPIMAT 2.5 MCG/ACT Aers Generic drug:  Tiotropium Bromide Monohydrate Inhale 2 puffs into the lungs daily.   spironolactone 25 MG tablet Commonly known as:  ALDACTONE Take 0.5 tablets (12.5 mg total) by mouth daily.   SYMBICORT 160-4.5 MCG/ACT inhaler Generic drug:  budesonide-formoterol INHALE 2 PUFFS INTO THE LUNGS EVERY MORNING AND ANOTHER 2 PUFFS ABOUT 12 HOURS LATER What changed:  See the new instructions.   Vitamin D3 50 MCG (2000 UT) Tabs Take 2,000 Units by mouth daily.      Follow-up Information    [facility].   Specialty:  [place]:  Home Health Registered Nurse, Physical Therapy and Occupational Therapy Contact information: [address] [place] [address] [phone]            Time coordinating discharge: 25 min  Signed:  Geradine Girt DO  [facility] 12/04/2017, 4:56 PM

## 2017-12-07 DIAGNOSIS — I110 Hypertensive heart disease with heart failure: Secondary | ICD-10-CM | POA: Diagnosis not present

## 2017-12-07 DIAGNOSIS — I2510 Atherosclerotic heart disease of native coronary artery without angina pectoris: Secondary | ICD-10-CM | POA: Diagnosis not present

## 2017-12-07 DIAGNOSIS — J9611 Chronic respiratory failure with hypoxia: Secondary | ICD-10-CM | POA: Diagnosis not present

## 2017-12-07 DIAGNOSIS — I739 Peripheral vascular disease, unspecified: Secondary | ICD-10-CM | POA: Diagnosis not present

## 2017-12-07 DIAGNOSIS — J441 Chronic obstructive pulmonary disease with (acute) exacerbation: Secondary | ICD-10-CM | POA: Diagnosis not present

## 2017-12-07 DIAGNOSIS — I5032 Chronic diastolic (congestive) heart failure: Secondary | ICD-10-CM | POA: Diagnosis not present

## 2017-12-10 ENCOUNTER — Encounter: Payer: Self-pay | Admitting: Podiatry

## 2017-12-10 ENCOUNTER — Telehealth: Payer: Self-pay | Admitting: Internal Medicine

## 2017-12-10 ENCOUNTER — Encounter: Payer: Self-pay | Admitting: Internal Medicine

## 2017-12-10 ENCOUNTER — Ambulatory Visit (INDEPENDENT_AMBULATORY_CARE_PROVIDER_SITE_OTHER): Payer: Medicare Other | Admitting: Internal Medicine

## 2017-12-10 VITALS — BP 102/60 | HR 95 | Temp 98.0°F | Ht 69.0 in | Wt 133.0 lb

## 2017-12-10 DIAGNOSIS — J9611 Chronic respiratory failure with hypoxia: Secondary | ICD-10-CM | POA: Diagnosis not present

## 2017-12-10 DIAGNOSIS — J9612 Chronic respiratory failure with hypercapnia: Secondary | ICD-10-CM

## 2017-12-10 DIAGNOSIS — J449 Chronic obstructive pulmonary disease, unspecified: Secondary | ICD-10-CM | POA: Diagnosis not present

## 2017-12-10 DIAGNOSIS — I200 Unstable angina: Secondary | ICD-10-CM

## 2017-12-10 DIAGNOSIS — I2781 Cor pulmonale (chronic): Secondary | ICD-10-CM | POA: Diagnosis not present

## 2017-12-10 NOTE — Assessment & Plan Note (Addendum)
-   Start noct 02 at 2lpm 2008 and on 03/04/09 desat @ > 185 ft so rec wear with activtiy > rm to rm  - 02/15/2011   Walked RA x one lap @ 185 stopped due to  desat > corrected on 2lpm - 02/26/2014  Walked 2lpm  2 laps @ 185 ft each stopped due to  Sob/ sats 88% at nl pace  - 10/19/2014   Walked RA  2 laps @ 185 ft each stopped due to  End of study, slow pace,  sob  But no  desat   05/21/15 Eval by Dr Dedio/ rec trial of NIV  - HCO3  10/27/15 = 34  - HC03  10/06/16  = 28 ? On noct vent (non compliant) - As of admit  02/04/17 back on noct vent/ 02 2lpm 24/7 - HC03 36  08/28/2017 despite reporting using noct vent    Though somewhat paradoxic, when the lung fails to clear C02 properly and pC02 rises the lung then becomes a more efficient scavenger of C02 allowing lower work of breathing and  better C02 clearance albeit at a higher serum pC02 level - this is why pts can look a lot better than their ABG's would suggest and why it's so difficult to prognosticate endstage dz.  It's also why I strongly rec DNI status (ventilating pts down to a nl pC02 adversely affects this compensatory mechanism)   At this point he is already receiving palliative care but needs to strongly consider signing up for hospice as well.  He has full DNI status but is still getting the trilogy vent at night which I believe is simply leading to daytime anxiety and breathlessness because he is not retaining CO2 which would in the setting actually be physiologic as noted above.   >>>   I did not recommend any change in his oxygen therapy but did recommend he stop the trilogy at this point and use Xanax 0.25 mg 4 times daily for air hunger and strongly again recommended he accept hospice with the goal of minimizing suffering at the end of this chapter of his  life - however long this is.   Marland Kitchen

## 2017-12-10 NOTE — Telephone Encounter (Signed)
Spoke with pt, he states he left a message for [employer]. He states he wanted to see if he could increase his O2 to 2.5 liters or should he just stay on 2L at all times. I asked him if he had a pulse oximetry at home to check is oxygen, he stated he did but its always normal but still is very SOB.   He also stated he wanted to let Dr. [name] that he did not want to go to the hospital but when he called for medical advice, they told him to go to the hospital. Dr. [name] please advise.

## 2017-12-10 NOTE — Assessment & Plan Note (Addendum)
-   PFTs  05/04/05 FEV1 36% ratio 28% with 29% improvement after bronchodilators DLCO 48%  - PFT's 03/26/08 30% ratio 34 with 14% improvement after bronchodilators DLC0 38 %  - Referred to rehab 12/19/2012 > completed March 2015  - changed to spiriva respimat 02/14/2013  - started daily prednisone 06/19/13 > improved only a little> tapered off mid July 2015 > ok to change to prn prednisone 08/27/2013  - flared off nasonex and gerd rx 09/21/2014 > resumed - 10/19/2014  p extensive coaching HFA effectiveness =    90%  - changed pred to ceiling of 20 / floor of 5 mg daily as of 10/19/2014 > changed to floor of 10 mg daily 10/04/2015  - PFT's  06/08/2015  FEV1 0.58 (22 % ) ratio 27  p No sign % improvement from saba p symb/spiriva prior to study with DLCO  9/9 % corrects to 27 % for alv volume   - changed pred to ceiling of 20 and floor of 5 mg daily 06/13/16 > increased floor to 10 mg daily effective 02/20/2017  - referred back to rehab 09/13/2016  - try off noct vent and start titrating up xanax 01/11/2017 > no change but then admitted with flu and placed back on vent  - alpha one AT 03/01/17  MM    - 10/29/2017  After extensive coaching inhaler device,  effectiveness =    75% (Ti short)  - 11/09/2017 COPD exacerbation. No improvement with prn zpack. CXR clear. Given course doxy and pred taper. Resp therapy home eval/treat   12/10/2017  After extensive coaching inhaler device,  effectiveness =    75% (short Ti)   At this point very little to offer this patient other than palliative care which he has so far declined to accept to the extent that I think it would benefit him.  See chronic respiratory failure.  The goal with a chronic steroid dependent illness is always arriving at the lowest effective dose that controls the disease/symptoms and not accepting a set "formula" which is based on statistics or guidelines that don't always take into account patient  variability or the natural hx of the dz in every individual  patient, which may well vary over time.  For now therefore I recommend the patient maintain  20 mg ceiling and 10 mg per day floor

## 2017-12-10 NOTE — Telephone Encounter (Signed)
If his O2 saturations are normal as they were at the office then the treatment is to use the Xanax as I recommended and not to increase the oxygen or go to the emergency room.  However it is fine with me if he ever does drop below 90% just the oxygen upward until it stays > 90%

## 2017-12-10 NOTE — Patient Instructions (Signed)
rec try off trilogy  Call for a different antibiotic if mucus stays green for 5 days after you finish the zpak   Discuss hospice with palliative care nurse   Increase the xanax to 0.25 mg four times daily   Please schedule a follow up office visit in 6 weeks, call sooner if needed

## 2017-12-10 NOTE — Assessment & Plan Note (Addendum)
Not well controlled but not using action plan per med calendar correctly re prn lasix >>> rec Continue elevation/ lasix rx as it is presently listed on med calendar     I had an extended discussion with the patient and wife  reviewing all relevant studies completed to date and  lasting 25   minutes of a 40 minute acute office  visit     See device teaching which extended face to face time for this visit     Each maintenance medication was reviewed in detail including most importantly the difference between maintenance and as needed and under what circumstances the prns are to be used. This was done in the context of a medication calendar review which provided the patient with a user-friendly unambiguous mechanism for medication administration and reconciliation and provides an action plan for all active problems. It is critical that this be shown to every doctor  for modification during the office visit if necessary so the patient can use it as a working document.

## 2017-12-10 NOTE — Progress Notes (Addendum)
Subjective:     Patient ID: [name], male    DOB: 1930-01-06   MRN: [id number]   Brief patient profile:  42   yowm MM quit smoking in 1984 with GOLD IV/ 02 dep and steroid dep copd place on NIV by Dr Larkin Ina with profound chronic FTT not improved on NIV      History of Present Illness  05/25/2017  f/u ov/[name] re:  [name] iv copd/ 02 dep  Chief Complaint  Patient presents with  . Follow-up    Increased SOB and  fatigue x last week, 2.5L pulsed, compliant with spiriva, symbicort, and xopenex.   Dyspnea:  At rest/ worse walking 10 ft  Cough: none Sleep: on trilogy ok SABA use:  Confused with saba hfa vs neb  rec Add aldactone 50 mg daily  Continue triegy at your volition Ok to adjust xanax (alprazolam) from one-half to one three times a day to calm your breathing down and relieve anxiety related to breathlessness     08/28/2017 acute extended ov/[name] re: GOLD IV copd/ cc sob/fatigue [name]  Chief Complaint  Patient presents with  . Acute Visit    For the last 5 days he has felt tired, and coughing. He start 20 mg of  pred. and Zpack   doe = MMRC4  = sob if tries to leave home or while getting dressed  Even on 02 titrated to keep sats > 90% Cough >  Min mucoid Sleeping on NIV flat comfortably rec No change medications    09/13/2017 acute extended ov/[name] re: ? Aecopd/ new nasal complaints/ chronic resp failure ? niv dep  Chief Complaint  Patient presents with  . Acute Visit    green nasal d/c x 1 day.  Prednisone 10 mg one daily is floor  No change doe / 02 req and no purulent chest secretions  Still sleeping on vent/ not sure it's helping at all at this point  rec Go ahead and take zpak as per med calendar and call if mucus not turning back to clear and we call you in augmentin x 10 days  Change the follow up appt to 6 weeks - call sooner if needed      10/29/2017  f/u ov/[name] re:  Copd /resp failure  Dyspnea:  MMRC4  = sob if tries to leave home or while getting  dressed   Cough: mucus white / mostly in am  Sleeping: on NIV SABA use: 3x daily hfa/ neb qod  02: 2lpm rec Increase xanax to 1 mg four times daliy with one extra if needed  Go ahead and try the ipatropium nasal spray as per Dr VanWinkle's recs and let him know if not helping Follow the medication calendar   Late add:  Error in xanax instruction, rx is correct as written on prescription:  Xanax 0.25 mg take 1 four times a day     12/10/2017  Acute extended ov/[name] re:  Copd / respiratory failure ON 02 and Trilogy / has med calendar, not following action plans Did not increase xanax as rec to 0.25 mg qid / does better when takes xanax 0.25 mg prn  Chief Complaint  Patient presents with  . Acute Visit    Breathing has been worse x 10 days. He has been feeling weak.  He produced some green sputum today.  Dyspnea:  Doe across the room on 2lpm  Cough: rattling, min discolored mucus Sleeping: on trilogy - can't tell any difference on vs  off x for nasal congestion seems better on it  SABA use: xopenex tid hfa or neb / did not increase prednisone as rec for flare  02: 2lpm 24/7     No obvious day to day or daytime variability or assoc excess/ purulent sputum or mucus plugs or hemoptysis or cp or chest tightness, subjective wheeze or overt sinus or hb symptoms.   Sleeping  without nocturnal  or early am exacerbation  of respiratory  c/o's or need for noct saba. Also denies any obvious fluctuation of symptoms with weather or environmental changes or other aggravating or alleviating factors except as outlined above   No unusual exposure hx or h/o childhood pna/ asthma or knowledge of premature birth.  Current Allergies, Complete Past Medical History, Past Surgical History, Family History, and Social History were reviewed in [employer] record.  ROS  The following are not active complaints unless bolded Hoarseness, sore throat, dysphagia, dental problems, itching,  sneezing,  nasal congestion or discharge of excess mucus or purulent secretions, ear ache,   fever, chills, sweats, unintended wt loss or wt gain, classically pleuritic or exertional cp,  orthopnea pnd or arm/hand swelling  or leg swelling, presyncope, palpitations, abdominal pain, anorexia, nausea, vomiting, diarrhea  or change in bowel habits or change in bladder habits, change in stools or change in urine, dysuria, hematuria,  rash, arthralgias, visual complaints, headache, numbness, weakness or ataxia or problems with walking or coordination,  change in mood or  memory.        Current Meds  Medication Sig  . ALPRAZolam (XANAX) 0.25 MG tablet One four times daily  . aspirin 81 MG tablet Take 81 mg by mouth daily.  Marland Kitchen atorvastatin (LIPITOR) 20 MG tablet TAKE 1 TABLET BY MOUTH EVERY DAY  . Cholecalciferol (VITAMIN D3) 2000 units TABS Take 2,000 Units by mouth daily.  Marland Kitchen dextromethorphan-guaiFENesin (MUCINEX DM) 30-600 MG 12hr tablet Take 1-2 tablets by mouth every 12 (twelve) hours as needed for cough (w/ flutter valve).  . diclofenac sodium (VOLTAREN) 1 % GEL Apply 2 g topically 4 (four) times daily.  . finasteride (PROSCAR) 5 MG tablet Take 5 mg by mouth every morning.  . furosemide (LASIX) 20 MG tablet TAKE 1 TABLET BY MOUTH EVERY DAY. MAY TAKE ADDITIONAL TABLET IF NEEDED FOR SWELLING (Patient taking differently: Take 20 mg by mouth daily. MAY TAKE ADDITIONAL TABLET IF NEEDED FOR SWELLING)  . levalbuterol (XOPENEX HFA) 45 MCG/ACT inhaler INHALE 2 PUFFS FOR PAIN EVERY 4 HOURS AS NEEDED FOR WHEEZING OR SHORTNESS OF BREATH (Patient taking differently: Inhale 2 puffs into the lungs every 4 (four) hours as needed for wheezing or shortness of breath. )  . metoprolol succinate (TOPROL-XL) 25 MG 24 hr tablet TAKE AT 12 NOON TIME (Patient taking differently: Take 12.5 mg by mouth daily. TAKE AT 12 NOON TIME)  . mometasone (NASONEX) 50 MCG/ACT nasal spray SHAKE LIQUID AND USE 2 SPRAYS IN EACH NOSTRIL DAILY  (Patient taking differently: Place 2 sprays into the nose daily. )  . Multiple Vitamins-Minerals (PRESERVISION AREDS 2) CAPS Take 1 capsule by mouth daily.  . nitroGLYCERIN (NITROSTAT) 0.4 MG SL tablet Place 0.4 mg under the tongue every 5 (five) minutes as needed for chest pain.  . OXYGEN Inhale 2 L continuous into the lungs.   . pantoprazole (PROTONIX) 40 MG tablet Take 40 mg by mouth daily.  . predniSONE (DELTASONE) 10 MG tablet For flare of cough/wheezing may increase to 2 daily until better, then 1  daily  . ranitidine (ZANTAC) 150 MG tablet Take 150 mg by mouth at bedtime.   Marland Kitchen SPIRIVA RESPIMAT 2.5 MCG/ACT AERS Inhale 2 puffs into the lungs daily.  Marland Kitchen spironolactone (ALDACTONE) 25 MG tablet Take 0.5 tablets (12.5 mg total) by mouth daily.  . SYMBICORT 160-4.5 MCG/ACT inhaler INHALE 2 PUFFS INTO THE LUNGS EVERY MORNING AND ANOTHER 2 PUFFS ABOUT 12 HOURS LATER (Patient taking differently: Inhale 2 puffs into the lungs 2 (two) times daily. )            Objective:   Physical Exam  Frail elderly wm able to stand on his own and walk to exam table s assistance but sob on arrival    wt 159 04/28/2010  > 10/14/2010 154  > 10/04/2011 150> 12/21/2011  150 > 149 01/29/2012 >154 04/05/2012 > 05/22/2012 148 > 09/30/2012  146 >>144> 10/15/2012 > 12/18/2012  145 > 148 02/14/2013 >148 03/14/2013 > 04/25/2013 148 > 06/19/2013 150 >>07/17/2013 > 08/27/2013 148 > 10/23/2013  148 >153 12/09/2013 > 02/26/2014  153 > 06/01/2014  152 > 157 06/15/2014 > 09/21/2014 146 > 10/19/2014 151 >>148 11/19/14 > 02/24/2015 148 > 06/30/2015    140 >  10/04/2015  136 > 12/20/2015   142 > 06/13/2016  132 > 09/13/2016   133 >  11/07/2016  139 > 01/11/2017   135 > 02/20/2017  135 >  03/22/2017  132 > 05/25/2017 134  > 06/27/2017  135 > 08/28/2017   135 > 09/13/2017   128  > 10/29/2017  129 > 12/10/2017  133    Vital signs reviewed - Note on arrival 02 sats  97% on 2lpm POC        HEENT: nl dentition / oropharynx. Nl external ear canals without cough reflex -  Mod  bilateral non-specific turbinate edema     NECK :  without JVD/Nodes/TM/ nl carotid upstrokes bilaterally   LUNGS: no acc muscle use,  Mod barrel  contour chest wall with bilateral  Distant bs s audible wheeze and  without cough on insp or exp maneuver and mod  Hyperresonant  to  percussion bilaterally     CV:  RRR  no s3 or murmur or increase in P2, and   1+ pitting on R, 2+ on L despite elastic hose   ABD:  soft and nontender with pos mid insp Hoover's  in the supine position. No bruits or organomegaly appreciated, bowel sounds nl  MS:   Nl gait/  ext warm without deformities, calf tenderness, cyanosis or clubbing No obvious joint restrictions   SKIN: warm and dry without lesions    NEURO:  alert, approp, nl sensorium with  no motor or cerebellar deficits apparent.

## 2017-12-10 NOTE — Progress Notes (Signed)
Subjective: [name] presents today peripheral arterial disease and cc of painful, discolored, thick toenails which interfere with daily activities and routine tasks. Pain is aggravated when wearing enclosed shoe gear. Pain is getting progressively worse and relieved with periodic professional debridement.   Objective: Vascular Examination: Capillary refill time <3 seconds x 10 digits Dorsalis pedis pulses absent b/l Posterior tibial pulses absent b/l No digital hair x 10 digits Skin temperature gradient WNL b/l Edema BLE  Dermatological Examination: Skin thin, shiny and atrophic b/l  Toenails 1-5 b/l discolored, thick, dystrophic with subungual debris and pain with palpation to nailbeds due to thickness of nails.  Musculoskeletal: Muscle strength 5/5 to all LE muscle groups  Neurological: Sensation intact with 10 gram monofilament b/l Vibratory sensation intact b/l  Assessment: 1. Painful onychomycosis toenails 1-5 b/l 2. Peripheral arterial disease  Plan: 1. Toenails 1-5 b/l were debrided in length and girth without iatrogenic bleeding. 2. Patient to continue soft, supportive shoe gear 3. Patient to report any pedal injuries to medical professional  4. Follow up 3 months.  5. Patient/POA to call should there be a concern in the interim.

## 2017-12-11 ENCOUNTER — Encounter: Payer: Self-pay | Admitting: Internal Medicine

## 2017-12-11 DIAGNOSIS — I2510 Atherosclerotic heart disease of native coronary artery without angina pectoris: Secondary | ICD-10-CM | POA: Diagnosis not present

## 2017-12-11 DIAGNOSIS — J9611 Chronic respiratory failure with hypoxia: Secondary | ICD-10-CM | POA: Diagnosis not present

## 2017-12-11 DIAGNOSIS — I739 Peripheral vascular disease, unspecified: Secondary | ICD-10-CM | POA: Diagnosis not present

## 2017-12-11 DIAGNOSIS — J441 Chronic obstructive pulmonary disease with (acute) exacerbation: Secondary | ICD-10-CM | POA: Diagnosis not present

## 2017-12-11 DIAGNOSIS — I110 Hypertensive heart disease with heart failure: Secondary | ICD-10-CM | POA: Diagnosis not present

## 2017-12-11 DIAGNOSIS — I5032 Chronic diastolic (congestive) heart failure: Secondary | ICD-10-CM | POA: Diagnosis not present

## 2017-12-11 NOTE — Telephone Encounter (Signed)
Patient returned call, CB is [phone]

## 2017-12-11 NOTE — Telephone Encounter (Signed)
Attempted to contact pt. I did not receive an answer. I have left a message for pt to return our call.  

## 2017-12-11 NOTE — Telephone Encounter (Signed)
Pt is aware of recs from MW. Nothing more needed at this time.

## 2017-12-13 DIAGNOSIS — H1851 Endothelial corneal dystrophy: Secondary | ICD-10-CM | POA: Diagnosis not present

## 2017-12-13 DIAGNOSIS — I5032 Chronic diastolic (congestive) heart failure: Secondary | ICD-10-CM | POA: Diagnosis not present

## 2017-12-13 DIAGNOSIS — I2510 Atherosclerotic heart disease of native coronary artery without angina pectoris: Secondary | ICD-10-CM | POA: Diagnosis not present

## 2017-12-13 DIAGNOSIS — I739 Peripheral vascular disease, unspecified: Secondary | ICD-10-CM | POA: Diagnosis not present

## 2017-12-13 DIAGNOSIS — J441 Chronic obstructive pulmonary disease with (acute) exacerbation: Secondary | ICD-10-CM | POA: Diagnosis not present

## 2017-12-13 DIAGNOSIS — I110 Hypertensive heart disease with heart failure: Secondary | ICD-10-CM | POA: Diagnosis not present

## 2017-12-13 DIAGNOSIS — J9611 Chronic respiratory failure with hypoxia: Secondary | ICD-10-CM | POA: Diagnosis not present

## 2017-12-14 ENCOUNTER — Telehealth: Payer: Self-pay | Admitting: Internal Medicine

## 2017-12-14 MED ORDER — AZITHROMYCIN 250 MG PO TABS: ORAL_TABLET | ORAL | 11 refills | Status: AC

## 2017-12-14 NOTE — Telephone Encounter (Signed)
Rx for Zpak with 11 refills has been sent to preferred pharmacy. Pt is aware and voiced his understanding.  Nothing further is needed.       Tanda Rockers, MD    12/14/17 5:01 PM  Note    Ok x 11 refills

## 2017-12-14 NOTE — Telephone Encounter (Signed)
Ok x 11 refills

## 2017-12-14 NOTE — Telephone Encounter (Signed)
Called and spoke with pt letting him know that MW said to send in Rx zpak to see if that would help him out. Pt expressed understanding and asked me if we were going to place any refills as he stated MW usually had refills on the Rx so he could get one fast when he needed it.  Dr. Alfredia Client advise if we can add any refills per pt's request. Thanks!

## 2017-12-14 NOTE — Telephone Encounter (Signed)
[id number] 

## 2017-12-14 NOTE — Telephone Encounter (Signed)
Called and spoke with pt who states in the mornings, he is still getting up green mucus from a little productive cough and also nasal drainage.  Pt states he is still a little more SOB than usual. Pt also states that he does have a sinus headache.  Pt denies any fever.  Pt states he saw MW Monday, 12/10/17 that if he continued to have green mucus after 5 days to call the office, so he is doing just that due to still having it.  Dr. [name], please advise on recs for pt. Thanks!

## 2017-12-19 DIAGNOSIS — I739 Peripheral vascular disease, unspecified: Secondary | ICD-10-CM | POA: Diagnosis not present

## 2017-12-19 DIAGNOSIS — I110 Hypertensive heart disease with heart failure: Secondary | ICD-10-CM | POA: Diagnosis not present

## 2017-12-19 DIAGNOSIS — I2510 Atherosclerotic heart disease of native coronary artery without angina pectoris: Secondary | ICD-10-CM | POA: Diagnosis not present

## 2017-12-19 DIAGNOSIS — J9611 Chronic respiratory failure with hypoxia: Secondary | ICD-10-CM | POA: Diagnosis not present

## 2017-12-19 DIAGNOSIS — J441 Chronic obstructive pulmonary disease with (acute) exacerbation: Secondary | ICD-10-CM | POA: Diagnosis not present

## 2017-12-19 DIAGNOSIS — I5032 Chronic diastolic (congestive) heart failure: Secondary | ICD-10-CM | POA: Diagnosis not present

## 2017-12-24 ENCOUNTER — Telehealth: Payer: Self-pay | Admitting: *Deleted

## 2017-12-24 NOTE — Telephone Encounter (Signed)
Contacted and spoke with patient's wife [name] to arrange a home visit. Visit scheduled for 12/25/17 at 1p

## 2017-12-25 ENCOUNTER — Telehealth: Payer: Self-pay | Admitting: Internal Medicine

## 2017-12-25 ENCOUNTER — Other Ambulatory Visit: Payer: 59 | Admitting: Licensed Clinical Social Worker

## 2017-12-25 ENCOUNTER — Other Ambulatory Visit: Payer: Medicare Other | Admitting: *Deleted

## 2017-12-25 DIAGNOSIS — Z515 Encounter for palliative care: Secondary | ICD-10-CM

## 2017-12-25 NOTE — Telephone Encounter (Signed)
Called the patient he has had more shortness of breath the last three day. Palliative care nurse said he was not moving very much air but felt he was wheezing or congested at this time.  He has completed the xpack at this time. He has taken the Xopenex 2 puffs tid but this has not helped much and he has also started taking the nebulizer solution once a day as well. He is has a running nose but it is clear in color at this time.  Denies a fever and a cough at this time.   He would like to have Mw advise before making a apt.   Mw please advise

## 2017-12-25 NOTE — Telephone Encounter (Signed)
Called and spoke with pt letting him know all stated info per MW. Pt expressed understanding. Stated to pt if he felt like he needed to come in for an OV to call our office or go to ER, or accept hospice recs. Pt expressed understanding. Nothing further needed.

## 2017-12-25 NOTE — Telephone Encounter (Signed)
The med calendar action plan has max doses for all his symptoms and he should follow it and the nebulizer max is up to every 4 hours, not once a day, and the prednisone is double the dose until better  If not improving then ov with all meds/ med calendar in hand to see NP to help him tweak his meds - if worse prior to ov go to er or accept hospice rec that I previously made but he did not agree to.

## 2017-12-26 NOTE — Progress Notes (Signed)
COMMUNITY PALLIATIVE CARE RN NOTE  PATIENT NAME: [name] DOB: August 18, 1929 MRN: [id number]  PRIMARY CARE PROVIDER: [name], MD  RESPONSIBLE PARTY: [name] (wfe) Acct ID - Guarantor Home Phone Work Phone Relationship Acct Type  [id number] - Stella,Chales L [phone]  Self P/F     [place], [name], [place] [address]    PLAN OF CARE and INTERVENTION:  1. ADVANCE CARE PLANNING/GOALS OF CARE: Remain in his home for as long as possible and avoid going to the hospital 2. PATIENT/CAREGIVER EDUCATION: Reinforced Safe Mobility, Energy Conservation and use of nebulizers 3. DISEASE STATUS: Joint visit made with Palliative Care SW, Lynn Duffy. Met with patient and his wife in their home. Patient reports that he has not been feeling well and he feels that his overall condition continues to worsen. He is experiencing increased dyspnea with conversation and any exertion. He reports that yesterday he was experiencing "bronchospasms," and could not catch his breath. He took his rescue inhaler with no effect, then immediately took a nebulizer treatment which was helpful. Advised that he most likely should be primarily utilizing his nebulizers vs inhalers, as his capacity to inhale is complicated by his shortness of breath. He remains on Oxygen at 2L/min via [place]. Continues with Trilogy machine throughout the night. He continues with dizziness upon standing in the am. Occasional drops in BP with standing occurs. He continues with sinus congestion. He is no longer using his Dymista for his sinuses, as he feels that this is causing him to be too drowsy. He eats 3 meals per day of small portions. His weights have been stable between 126-127 lbs. 2+ edema noted to lower L extremity. He reports low energy most days. It is taking much longer for patient to bathe and dress himself. He is requesting that his wife help to wash his hair today. This is a sign per wife of decline, as patient never asks for help  with anything. Wife is looking into using their [place] insurance for a home health aide. He was hospitalized x 24 hours from 12/03/17 to 12/04/17 due to chest pain and progressive weakness. Upon discharge a home, a referral was made to [facility] for Home health RN, Physical therapy and Occupational therapy. They have been seen several times by RN, but is awaiting PT/OT evaluation to be set up. Provided patient and wife information regarding hospice. Both want to wait until after PT/OT evaluation and treatments prior to making this decision. Will continue to monitor.    HISTORY OF PRESENT ILLNESS: This is a 84 yo male who resides at home with his wife. Palliative Care Team continues to follow patient. Next visit scheduled in 2 weeks.   CODE STATUS: FULL CODE ADVANCED DIRECTIVES: N MOST FORM: no PPS: 50%   PHYSICAL EXAM:   VITALS: Today's Vitals   12/25/17 1611  BP: 112/64  Pulse: 99  Resp: 20  Temp: 98.4 F (36.9 C)  TempSrc: Temporal  SpO2: 95%  PainSc: 0-No pain    LUNGS: decreased breath sounds CARDIAC: Cor RRR EXTREMITIES: 2+ edema to L lower extremity SKIN: Bruising noted to bilateral hands  NEURO: Alert and oriented x 3, HOH, increased generalized weakness, ambulatory   (Duration of visit and documentation 75 minutes)    [name], RN, BSN

## 2017-12-26 NOTE — Progress Notes (Signed)
COMMUNITY PALLIATIVE CARE SW NOTE  PATIENT NAME: [name] DOB: 17-Nov-1929 MRN: [id number]  PRIMARY CARE PROVIDER: [name], MD  RESPONSIBLE PARTY:  Acct ID - Guarantor Home Phone Work Phone Relationship Acct Type  [id number] - Schindel,Zander L [phone]  Self P/F     [place], [name], [place] [address]    PLAN OF CARE and INTERVENTIONS:             1. GOALS OF CARE/ ADVANCE CARE PLANNING:  Patient wishes to remain with his wife in their home.  Patient remains a full code. 2. SOCIAL/EMOTIONAL/SPIRITUAL ASSESSMENT/ INTERVENTIONS:  SW and Palliative Care RN, [name] met with patient and his wife, [name], in their home.  Patient asked [name] to help him wash his hair.  This is a new request indicating patient's increased fatigue.  SW provided active listening and supportive counseling while patient talked about his recent visit to the ED due to difficulty breathing.  He denied pain.  [name] requested SW review her newsletter from her [employer].  Recommended she contact the company and request specific details of covered services.  Aiden took several breaks in the conversation due to visible shortness of breath. 3. PATIENT/CAREGIVER EDUCATION/ COPING:  Patient copes by expressing his feelings openly.  [name] said she had no questions regarding Hospice and wishes for patient to attempt PT before making that decision. 4. PERSONAL EMERGENCY PLAN:  He will inform his wife with any medical concerns. 5. COMMUNITY RESOURCES COORDINATION/ HEALTH CARE NAVIGATION:  Patient utilizes the medical community to address his concerns. 6. FINANCIAL/LEGAL CONCERNS/INTERVENTIONS:  None.     SOCIAL HX:  Social History   Tobacco Use  . Smoking status: Former Smoker    Packs/day: 2.00    Years: 33.00    Pack years: 66.00    Types: Cigarettes    Last attempt to quit: 03/25/1982    Years since quitting: 35.7  . Smokeless tobacco: Never Used  Substance Use Topics  . Alcohol use:  Yes    Alcohol/week: 0.0 standard drinks    Comment: 1.5-3 oz daily in the past, minimal use in the last few months   CODE STATUS:  Full Code ADVANCED DIRECTIVES: N MOST FORM COMPLETE:  N HOSPICE EDUCATION PROVIDED: N PPS:  Patient said his appetite is normal.  He stands and walks independently. Duration of visit and documentation:  75 minutes.      Creola Corn Jasun Gasparini, LCSW

## 2017-12-27 DIAGNOSIS — I110 Hypertensive heart disease with heart failure: Secondary | ICD-10-CM | POA: Diagnosis not present

## 2017-12-27 DIAGNOSIS — I5032 Chronic diastolic (congestive) heart failure: Secondary | ICD-10-CM | POA: Diagnosis not present

## 2017-12-27 DIAGNOSIS — I2510 Atherosclerotic heart disease of native coronary artery without angina pectoris: Secondary | ICD-10-CM | POA: Diagnosis not present

## 2017-12-27 DIAGNOSIS — J9611 Chronic respiratory failure with hypoxia: Secondary | ICD-10-CM | POA: Diagnosis not present

## 2017-12-27 DIAGNOSIS — I739 Peripheral vascular disease, unspecified: Secondary | ICD-10-CM | POA: Diagnosis not present

## 2017-12-27 DIAGNOSIS — J441 Chronic obstructive pulmonary disease with (acute) exacerbation: Secondary | ICD-10-CM | POA: Diagnosis not present

## 2017-12-28 DIAGNOSIS — I110 Hypertensive heart disease with heart failure: Secondary | ICD-10-CM | POA: Diagnosis not present

## 2017-12-28 DIAGNOSIS — I739 Peripheral vascular disease, unspecified: Secondary | ICD-10-CM | POA: Diagnosis not present

## 2017-12-28 DIAGNOSIS — J441 Chronic obstructive pulmonary disease with (acute) exacerbation: Secondary | ICD-10-CM | POA: Diagnosis not present

## 2017-12-28 DIAGNOSIS — I2510 Atherosclerotic heart disease of native coronary artery without angina pectoris: Secondary | ICD-10-CM | POA: Diagnosis not present

## 2017-12-28 DIAGNOSIS — J9611 Chronic respiratory failure with hypoxia: Secondary | ICD-10-CM | POA: Diagnosis not present

## 2017-12-28 DIAGNOSIS — I5032 Chronic diastolic (congestive) heart failure: Secondary | ICD-10-CM | POA: Diagnosis not present

## 2018-01-02 ENCOUNTER — Other Ambulatory Visit: Payer: Self-pay | Admitting: Internal Medicine

## 2018-01-03 DIAGNOSIS — J9611 Chronic respiratory failure with hypoxia: Secondary | ICD-10-CM | POA: Diagnosis not present

## 2018-01-03 DIAGNOSIS — J441 Chronic obstructive pulmonary disease with (acute) exacerbation: Secondary | ICD-10-CM | POA: Diagnosis not present

## 2018-01-03 DIAGNOSIS — I739 Peripheral vascular disease, unspecified: Secondary | ICD-10-CM | POA: Diagnosis not present

## 2018-01-03 DIAGNOSIS — I2510 Atherosclerotic heart disease of native coronary artery without angina pectoris: Secondary | ICD-10-CM | POA: Diagnosis not present

## 2018-01-03 DIAGNOSIS — I5032 Chronic diastolic (congestive) heart failure: Secondary | ICD-10-CM | POA: Diagnosis not present

## 2018-01-03 DIAGNOSIS — I110 Hypertensive heart disease with heart failure: Secondary | ICD-10-CM | POA: Diagnosis not present

## 2018-01-04 ENCOUNTER — Encounter: Payer: Self-pay | Admitting: Cardiology

## 2018-01-04 ENCOUNTER — Ambulatory Visit (INDEPENDENT_AMBULATORY_CARE_PROVIDER_SITE_OTHER): Payer: Medicare Other | Admitting: Cardiology

## 2018-01-04 VITALS — BP 140/60 | HR 103 | Ht 69.0 in | Wt 134.0 lb

## 2018-01-04 DIAGNOSIS — E782 Mixed hyperlipidemia: Secondary | ICD-10-CM | POA: Diagnosis not present

## 2018-01-04 DIAGNOSIS — R0602 Shortness of breath: Secondary | ICD-10-CM

## 2018-01-04 DIAGNOSIS — I2510 Atherosclerotic heart disease of native coronary artery without angina pectoris: Secondary | ICD-10-CM

## 2018-01-04 DIAGNOSIS — Z79899 Other long term (current) drug therapy: Secondary | ICD-10-CM | POA: Diagnosis not present

## 2018-01-04 DIAGNOSIS — I2781 Cor pulmonale (chronic): Secondary | ICD-10-CM | POA: Diagnosis not present

## 2018-01-04 DIAGNOSIS — I10 Essential (primary) hypertension: Secondary | ICD-10-CM

## 2018-01-04 DIAGNOSIS — I200 Unstable angina: Secondary | ICD-10-CM | POA: Diagnosis not present

## 2018-01-04 DIAGNOSIS — F419 Anxiety disorder, unspecified: Secondary | ICD-10-CM | POA: Diagnosis not present

## 2018-01-04 DIAGNOSIS — I5032 Chronic diastolic (congestive) heart failure: Secondary | ICD-10-CM | POA: Diagnosis not present

## 2018-01-04 MED ORDER — NITROGLYCERIN 0.4 MG SL SUBL: 0.4000 mg | SUBLINGUAL_TABLET | SUBLINGUAL | 3 refills | Status: DC | PRN

## 2018-01-04 NOTE — Progress Notes (Signed)
Cardiology Office Note   Date:  01/04/2018   ID:  [name], DOB 12-02-1929, MRN [id number]  PCP:  [name], MD  Cardiologist:  Dr. Tamala Julian     Chief Complaint  Patient presents with  . Coronary Artery Disease      History of Present Illness: [name] is a 84 y.o. male who presents for CAD and edema.  He has a hx of CABG2003, HTN, COPD on steroids w/ chronic resp failure,cor pulmonale on 2 L oxygen and nocturnal ventilation, uses trilogy machine,hyperlipidemia, PAD, history of carotid obstructive disease andRBBB admitted for chest pain and weakness, loss of appetite.  12/04/17.  troponins were neg.   Last echo 03/09/17 with EF 60-65%, G1DD, telemetry monitor in June 2019 with SR to ST with PACs and PVCs. No a fib or tachycardia, sustained.  Hx of tremor.  He does have palliative care but not hospice and he is Full Code.  Dr. [name] has asked him to stop Trilogy at night and to use xanax for air hunger but pt is reluctant.  He also does not wish to take too much xanax and be sedated and not have input in his care.  He is aware that he does not have much time, he told his son if he died over Christmas not to tell the grandchildren until later he did not wish to upset their holiday.  Fortunately he is still with [place].        Last cath 2003 leading to CABG.    Today he does not have chest pain, but does have edema, he took extra lasix for last 5 days.  He held off yesterday and today.  He does wear support stockings.  He is on oxygen 24/7.  He would like brand name nitro stat.  No chest pain though on last hospital visit he was told to go to ER and with knowing he had to go to hospital he became anxious and chest pain occurred.  Troponin was flat. no MI.       Past Medical History:  Diagnosis Date  . Arthritis    "generalized" (04/04/2017)  . Asthma   . Basal cell carcinoma    "left ear"  . BPH (benign prostatic hyperplasia)    severe; s/p multiple biopsies  . CAD  (coronary artery disease)   . Carotid artery occlusion   . Chronic respiratory failure ([place])   . Chronic rhinitis   . COPD (chronic obstructive pulmonary disease) ([facility])    2L [place] O2  . Diastolic heart failure ([place]) 2019  . Dilation of biliary tract   . Elevated troponin 03/09/2017  . Gallstones   . GERD (gastroesophageal reflux disease)   . History of blood transfusion    "w/his CABG" (04/04/2017)  . History of kidney stones   . Hyperlipidemia   . Hypertension   . On home oxygen therapy    "~ 24/7" (04/04/2017)  . Peripheral vascular disease ([place])   . Pneumonia 2019  . Syncope and collapse   . Ureteral tumor 08/2015   had endoscopic procedure for evaluation, unable to reach for biopsy    Past Surgical History:  Procedure Laterality Date  . BASAL CELL CARCINOMA EXCISION Left    ear  . CARDIAC CATHETERIZATION     "prior to bypass"  . CATARACT EXTRACTION W/ INTRAOCULAR LENS  IMPLANT, BILATERAL Bilateral   . CORONARY ARTERY BYPASS GRAFT  10/2001   LIMA to LAD, SVG to OM1-2, SVG  to RCA and PDA  . LITHOTRIPSY    . PROSTATE BIOPSY  Oct. 2014  . TONSILLECTOMY       Current Outpatient Medications  Medication Sig Dispense Refill  . ALPRAZolam (XANAX) 0.25 MG tablet One four times daily 120 tablet 2  . aspirin 81 MG tablet Take 81 mg by mouth daily.    Marland Kitchen atorvastatin (LIPITOR) 20 MG tablet TAKE 1 TABLET BY MOUTH EVERY DAY 90 tablet 2  . Cholecalciferol (VITAMIN D3) 2000 units TABS Take 2,000 Units by mouth daily.    Marland Kitchen dextromethorphan-guaiFENesin (MUCINEX DM) 30-600 MG 12hr tablet Take 1-2 tablets by mouth every 12 (twelve) hours as needed for cough (w/ flutter valve).    . diclofenac sodium (VOLTAREN) 1 % GEL Apply 2 g topically 4 (four) times daily. 1 Tube 0  . finasteride (PROSCAR) 5 MG tablet Take 5 mg by mouth every morning.    . furosemide (LASIX) 20 MG tablet TAKE 1 TABLET BY MOUTH EVERY DAY. MAY TAKE ADDITIONAL TABLET IF NEEDED FOR SWELLING 45 tablet 2  . levalbuterol  (XOPENEX HFA) 45 MCG/ACT inhaler Inhale 2 puffs into the lungs every 4 (four) hours as needed for wheezing or shortness of breath. 15 g 4  . metoprolol succinate (TOPROL-XL) 25 MG 24 hr tablet Take 12.5 mg by mouth daily.    . mometasone (NASONEX) 50 MCG/ACT nasal spray SHAKE LIQUID AND USE 2 SPRAYS IN EACH NOSTRIL DAILY 17 g 11  . Multiple Vitamins-Minerals (PRESERVISION AREDS 2) CAPS Take 1 capsule by mouth daily.    . nitroGLYCERIN (NITROSTAT) 0.4 MG SL tablet Place 0.4 mg under the tongue every 5 (five) minutes as needed for chest pain.    . OXYGEN Inhale 2 L continuous into the lungs.     . pantoprazole (PROTONIX) 40 MG tablet Take 40 mg by mouth daily.    . predniSONE (DELTASONE) 10 MG tablet For flare of cough/wheezing may increase to 2 daily until better, then 1 daily 100 tablet 2  . ranitidine (ZANTAC) 150 MG tablet Take 150 mg by mouth at bedtime.   0  . SPIRIVA RESPIMAT 2.5 MCG/ACT AERS Inhale 2 puffs into the lungs daily.  11  . spironolactone (ALDACTONE) 25 MG tablet Take 0.5 tablets (12.5 mg total) by mouth daily. 45 tablet 3  . SYMBICORT 160-4.5 MCG/ACT inhaler INHALE 2 PUFFS INTO THE LUNGS EVERY MORNING AND ANOTHER 2 PUFFS ABOUT 12 HOURS LATER 10.2 g 11   No current facility-administered medications for this visit.     Allergies:   Cefdinir; Nitrofurantoin; Sulfa antibiotics; Sulfonamide derivatives; Cefdinir; Ciprofloxacin; Nitrofurantoin; Doxycycline; Sertraline; Ciprofloxacin; Levaquin [levofloxacin]; and Levaquin [levofloxacin]    Social History:  The patient  reports that he quit smoking about 35 years ago. His smoking use included cigarettes. He has a 66.00 pack-year smoking history. He has never used smokeless tobacco. He reports current alcohol use. He reports that he does not use drugs.   Family History:  The patient's family history includes Allergies in his brother; Breast cancer in his mother; Cancer in his mother; Heart attack in his father; Heart disease in his  brother, brother, father, and mother; Hyperlipidemia in his brother; Hypertension in his brother and mother; Other in his mother; Peripheral vascular disease in his brother; Stroke in his brother.    ROS:  General:no colds or fevers, no weight changes Skin:no rashes or ulcers HEENT:no blurred vision, no congestion CV:see HPI PUL:see HPI GI:no diarrhea constipation or melena, no indigestion GU:no hematuria, no dysuria  MS:no joint pain, no claudication Neuro:no syncope, no lightheadedness Endo:no diabetes, no thyroid disease  Wt Readings from Last 3 Encounters:  01/04/18 134 lb (60.8 kg)  12/10/17 133 lb (60.3 kg)  12/03/17 127 lb (57.6 kg)     PHYSICAL EXAM: VS:  BP 140/60   Pulse (!) 103   Ht 5\' 9"  (1.753 m)   Wt 134 lb (60.8 kg)   SpO2 92%   BMI 19.79 kg/m  , BMI Body mass index is 19.79 kg/m. General:Pleasant affect, NAD Skin:Warm and dry, brisk capillary refill HEENT:normocephalic, sclera clear, mucus membranes moist Neck:supple, no JVD sitting up, no bruits  Heart:S1S2 RRR without murmur, gallup, rub or click Lungs:diminished throughout without rales, rhonchi, or wheezes oxygen in place. ZJQ:BHAL, non tender, + BS, do not palpate liver spleen or masses Ext:1-2+ lower ext edema, support stockings in place., 2+ radial pulses Neuro:alert and oriented X 3, MAE, follows commands, + facial symmetry    EKG:  EKG is NOT ordered today.   Recent Labs: 04/04/2017: ALT 20 11/09/2017: Pro B Natriuretic peptide (BNP) 100.0 12/03/2017: B Natriuretic Peptide 104.6; BUN 21; Creatinine, Ser 1.15; Hemoglobin 13.1; Platelets 241; Potassium 4.3; Sodium 138; TSH 2.212 12/04/2017: Magnesium 2.2    Lipid Panel    Component Value Date/Time   CHOL 146 03/26/2017 0621   TRIG 34 03/26/2017 0621   HDL 79 03/26/2017 0621   CHOLHDL 1.8 03/26/2017 0621   VLDL 7 03/26/2017 0621   LDLCALC 60 03/26/2017 0621       Other studies Reviewed: Additional studies/ records that were reviewed  today include:  Echo 03/09/17. Study Conclusions  - Left ventricle: The cavity size was normal. Systolic function was   normal. The estimated ejection fraction was in the range of 60%   to 65%. Wall motion was normal; there were no regional wall   motion abnormalities. Doppler parameters are consistent with   abnormal left ventricular relaxation (grade 1 diastolic   dysfunction). - Aortic valve: Transvalvular velocity was within the normal range.   There was no stenosis. There was no regurgitation. - Mitral valve: Transvalvular velocity was within the normal range.   There was no evidence for stenosis. There was trivial   regurgitation. - Right ventricle: The cavity size was normal. Wall thickness was   normal. Systolic function was reduced. - Tricuspid valve: There was mild regurgitation. - Pulmonary arteries: Systolic pressure was within the normal   range. PA peak pressure: 24 mm Hg (S). - Pericardium, extracardiac: A trivial pericardial effusion was   identified.   ASSESSMENT AND PLAN:  1.  CAD without angina stable disease.  Refill NTG prn  Follow up with Dr. Tamala Julian in March  2.  Lower ext edema, will take an extra lasix once over weekend, will check BMP , pro bnp today and Mg+ at this point slow diuresis, at times he does become dehydrated.   3.  HTN up some today but it is labile no changes.  4.  HLD continue statin  5.  Chronic diastolic HF.  Increased edema, but wt stable.  6.  Anxiety but wants to remain active participant in his care.  palative care does visit but he is reluctant to go with hospice.  7.  COPD Gold per Dr. [name], on oxygen, steroids     Current medicines are reviewed with the patient today.  The patient Has no concerns regarding medicines.  The following changes have been made:  See above Labs/ tests ordered today include:see above  Disposition:   FU:  see above  Signed, Cecilie Kicks, NP  01/04/2018 3:26 PM    [facility] [place], [place], [place], [place] Phone: [phone]; Fax: [phone]

## 2018-01-04 NOTE — Patient Instructions (Signed)
Medication Instructions:  Your physician recommends that you continue on your current medications as directed. Please refer to the Current Medication list given to you today.  If you need a refill on your cardiac medications before your next appointment, please call your pharmacy.   Lab work: TODAY:  MAG, BMET, & PRO BNP  If you have labs (blood work) drawn today and your tests are completely normal, you will receive your results only by: Marland Kitchen MyChart Message (if you have MyChart) OR . A paper copy in the mail If you have any lab test that is abnormal or we need to change your treatment, we will call you to review the results.  Testing/Procedures: None ordered  Follow-Up: Your physician recommends that you schedule a follow-up appointment in: [place]. Tamala Julian

## 2018-01-05 LAB — BASIC METABOLIC PANEL
BUN/Creatinine Ratio: 19 (ref 10–24)
BUN: 22 mg/dL (ref 8–27)
CO2: 31 mmol/L — ABNORMAL HIGH (ref 20–29)
Calcium: 9.8 mg/dL (ref 8.6–10.2)
Chloride: 96 mmol/L (ref 96–106)
Creatinine, Ser: 1.17 mg/dL (ref 0.76–1.27)
GFR calc Af Amer: 65 mL/min/{1.73_m2} (ref >59)
GFR calc non Af Amer: 56 mL/min/{1.73_m2} — ABNORMAL LOW (ref >59)
Glucose: 138 mg/dL — ABNORMAL HIGH (ref 65–99)
Potassium: 4.5 mmol/L (ref 3.5–5.2)
Sodium: 141 mmol/L (ref 134–144)

## 2018-01-05 LAB — MAGNESIUM: Magnesium: 2.6 mg/dL — ABNORMAL HIGH (ref 1.6–2.3)

## 2018-01-05 LAB — PRO B NATRIURETIC PEPTIDE: NT-Pro BNP: 460 pg/mL (ref 0–486)

## 2018-01-07 DIAGNOSIS — J9611 Chronic respiratory failure with hypoxia: Secondary | ICD-10-CM | POA: Diagnosis not present

## 2018-01-07 DIAGNOSIS — I110 Hypertensive heart disease with heart failure: Secondary | ICD-10-CM | POA: Diagnosis not present

## 2018-01-07 DIAGNOSIS — I739 Peripheral vascular disease, unspecified: Secondary | ICD-10-CM | POA: Diagnosis not present

## 2018-01-07 DIAGNOSIS — I2510 Atherosclerotic heart disease of native coronary artery without angina pectoris: Secondary | ICD-10-CM | POA: Diagnosis not present

## 2018-01-07 DIAGNOSIS — I5032 Chronic diastolic (congestive) heart failure: Secondary | ICD-10-CM | POA: Diagnosis not present

## 2018-01-07 DIAGNOSIS — J441 Chronic obstructive pulmonary disease with (acute) exacerbation: Secondary | ICD-10-CM | POA: Diagnosis not present

## 2018-01-08 ENCOUNTER — Other Ambulatory Visit: Payer: Medicare Other | Admitting: *Deleted

## 2018-01-08 DIAGNOSIS — Z515 Encounter for palliative care: Secondary | ICD-10-CM

## 2018-01-08 NOTE — Progress Notes (Signed)
COMMUNITY PALLIATIVE CARE RN NOTE  PATIENT NAME: [name] DOB: Jan 25, 1929 MRN: [id number]  PRIMARY CARE PROVIDER: [name], MD  RESPONSIBLE PARTY: Delvecchio Madole (wife) Acct ID - Guarantor Home Phone Work Phone Relationship Acct Type  [id number] - Nucci,Jarone L [phone]  Self P/F     [place], [name], [place] [address]    PLAN OF CARE and INTERVENTION:  1. ADVANCE CARE PLANNING/GOALS OF CARE: He wants to remain at home with his wife. He is a FULL CODE 2. PATIENT/CAREGIVER EDUCATION: Reinforced Safe Mobility and Energy Saving Techniques 3. DISEASE STATUS: Met with patient and his wife in their home. Patient woke up late today and was just finishing breakfast at 1p. He denies pain, but does experience pain in his shoulders and lower back at times, especially when first getting up in the mornings. He c/o feeling more dizzy today than usual as well as an increased lack of energy. He remains on Oxygen at 2L/min during conversation. He required use of his inhaler towards beginning of visit after ambulating into the living room from the kitchen. He has been using his nebulizer treatments more regularly, which has been helpful. He has also increased his mid day dose of Xanax. He is taking 1/2 of a 0.48m tablet in the am and pm and whole tablet mid day. He says he is thinking about taking a full tablet 3x/day as recommended by his Pulmonologist to help with anxiety and ease breathing. Continues with sinus, pressure and congestion that he feels interferes with his vision. He is using Cromolyn nasal spray daily. He is back working with Physical Therapy. He had a visit yesterday and reports despite feeling well, he was able to complete exercises and walk 4 laps within his home. No skin issues. 2+ edema noted to L lower extremity. He states that he has been having more swelling in his legs over past 2 days, however when taking an additional Lasix dose, his BP tends to drop too low. He wants to  try seeing a Neurologist for hand tremors, which was noted today. He was unable to make his first scheduled visit there, as he was not feeling well that day. Will continue to monitor.   HISTORY OF PRESENT ILLNESS:  This is a 84yo male who resides at home with his wife. Palliative Care Team continues to follow patient. Next visit scheduled in 2 weeks.   CODE STATUS: FULL CODE  ADVANCED DIRECTIVES: N MOST FORM: no PPS: 50%   PHYSICAL EXAM:   VITALS: Today's Vitals   01/08/18 1325  BP: 90/60  Pulse: 81  Resp: 20  Temp: 98.6 F (37 C)  TempSrc: Temporal  SpO2: 99%  PainSc: 0-No pain    LUNGS: clear to auscultation , decreased breath sounds CARDIAC: Cor RRR EXTREMITIES: 2+ and non-pitting edema in left lower extremity SKIN: Thin/frail skin, bruises easily  NEURO: Alert and oriented x 3, pleasant mood, generalized weakness, ambulatory with walker   (Duration of visit and documentation 60 minutes)    MDaryl Eastern RN, BSN

## 2018-01-10 DIAGNOSIS — H353131 Nonexudative age-related macular degeneration, bilateral, early dry stage: Secondary | ICD-10-CM | POA: Diagnosis not present

## 2018-01-10 DIAGNOSIS — H40053 Ocular hypertension, bilateral: Secondary | ICD-10-CM | POA: Diagnosis not present

## 2018-01-10 DIAGNOSIS — H17823 Peripheral opacity of cornea, bilateral: Secondary | ICD-10-CM | POA: Diagnosis not present

## 2018-01-10 DIAGNOSIS — H43813 Vitreous degeneration, bilateral: Secondary | ICD-10-CM | POA: Diagnosis not present

## 2018-01-10 DIAGNOSIS — H18831 Recurrent erosion of cornea, right eye: Secondary | ICD-10-CM | POA: Diagnosis not present

## 2018-01-10 DIAGNOSIS — Z961 Presence of intraocular lens: Secondary | ICD-10-CM | POA: Diagnosis not present

## 2018-01-10 DIAGNOSIS — H1851 Endothelial corneal dystrophy: Secondary | ICD-10-CM | POA: Diagnosis not present

## 2018-01-10 DIAGNOSIS — H35373 Puckering of macula, bilateral: Secondary | ICD-10-CM | POA: Diagnosis not present

## 2018-01-11 DIAGNOSIS — I2510 Atherosclerotic heart disease of native coronary artery without angina pectoris: Secondary | ICD-10-CM | POA: Diagnosis not present

## 2018-01-11 DIAGNOSIS — J9611 Chronic respiratory failure with hypoxia: Secondary | ICD-10-CM | POA: Diagnosis not present

## 2018-01-11 DIAGNOSIS — I110 Hypertensive heart disease with heart failure: Secondary | ICD-10-CM | POA: Diagnosis not present

## 2018-01-11 DIAGNOSIS — I5032 Chronic diastolic (congestive) heart failure: Secondary | ICD-10-CM | POA: Diagnosis not present

## 2018-01-11 DIAGNOSIS — J441 Chronic obstructive pulmonary disease with (acute) exacerbation: Secondary | ICD-10-CM | POA: Diagnosis not present

## 2018-01-11 DIAGNOSIS — I739 Peripheral vascular disease, unspecified: Secondary | ICD-10-CM | POA: Diagnosis not present

## 2018-01-15 DIAGNOSIS — J019 Acute sinusitis, unspecified: Secondary | ICD-10-CM | POA: Diagnosis not present

## 2018-01-15 DIAGNOSIS — K219 Gastro-esophageal reflux disease without esophagitis: Secondary | ICD-10-CM | POA: Diagnosis not present

## 2018-01-15 DIAGNOSIS — J3089 Other allergic rhinitis: Secondary | ICD-10-CM | POA: Diagnosis not present

## 2018-01-15 DIAGNOSIS — J449 Chronic obstructive pulmonary disease, unspecified: Secondary | ICD-10-CM | POA: Diagnosis not present

## 2018-01-16 DIAGNOSIS — I739 Peripheral vascular disease, unspecified: Secondary | ICD-10-CM | POA: Diagnosis not present

## 2018-01-16 DIAGNOSIS — I110 Hypertensive heart disease with heart failure: Secondary | ICD-10-CM | POA: Diagnosis not present

## 2018-01-16 DIAGNOSIS — J9611 Chronic respiratory failure with hypoxia: Secondary | ICD-10-CM | POA: Diagnosis not present

## 2018-01-16 DIAGNOSIS — I5032 Chronic diastolic (congestive) heart failure: Secondary | ICD-10-CM | POA: Diagnosis not present

## 2018-01-16 DIAGNOSIS — I2510 Atherosclerotic heart disease of native coronary artery without angina pectoris: Secondary | ICD-10-CM | POA: Diagnosis not present

## 2018-01-16 DIAGNOSIS — J441 Chronic obstructive pulmonary disease with (acute) exacerbation: Secondary | ICD-10-CM | POA: Diagnosis not present

## 2018-01-21 ENCOUNTER — Encounter: Payer: Self-pay | Admitting: Internal Medicine

## 2018-01-21 ENCOUNTER — Ambulatory Visit (INDEPENDENT_AMBULATORY_CARE_PROVIDER_SITE_OTHER): Payer: Medicare Other | Admitting: Internal Medicine

## 2018-01-21 ENCOUNTER — Other Ambulatory Visit: Payer: Self-pay | Admitting: Internal Medicine

## 2018-01-21 VITALS — BP 152/74 | HR 109

## 2018-01-21 DIAGNOSIS — J9611 Chronic respiratory failure with hypoxia: Secondary | ICD-10-CM | POA: Diagnosis not present

## 2018-01-21 DIAGNOSIS — J449 Chronic obstructive pulmonary disease, unspecified: Secondary | ICD-10-CM

## 2018-01-21 DIAGNOSIS — J9612 Chronic respiratory failure with hypercapnia: Secondary | ICD-10-CM

## 2018-01-21 NOTE — Progress Notes (Signed)
Subjective:     Patient ID: [name], male    DOB: August 29, 1929   MRN: [id number]   Brief patient profile:  70   yowm MM quit smoking in 1984 with GOLD IV/ 02 dep and steroid dep copd place on NIV by [name] with profound chronic FTT not improved on NIV    History of Present Illness  05/25/2017  f/u ov/[name] re:  [name] iv copd/ 02 dep  Chief Complaint  Patient presents with  . Follow-up    Increased SOB and  fatigue x last week, 2.5L pulsed, compliant with spiriva, symbicort, and xopenex.   Dyspnea:  At rest/ worse walking 10 ft  Cough: none Sleep: on trilogy ok SABA use:  Confused with saba hfa vs neb  rec Add aldactone 50 mg daily  Continue triegy at your volition Ok to adjust xanax (alprazolam) from one-half to one three times a day to calm your breathing down and relieve anxiety related to breathlessness      08/28/2017 acute extended ov/[name] re: GOLD IV copd/ cc sob/fatigue [name]  Chief Complaint  Patient presents with  . Acute Visit    For the last 5 days he has felt tired, and coughing. He start 20 mg of  pred. and Zpack   doe = MMRC4  = sob if tries to leave home or while getting dressed  Even on 02 titrated to keep sats > 90% Cough >  Min mucoid Sleeping on NIV flat comfortably rec No change medications    09/13/2017 acute extended ov/[name] re: ? Aecopd/ new nasal complaints/ chronic resp failure ? niv dep  Chief Complaint  Patient presents with  . Acute Visit    green nasal d/c x 1 day.  Prednisone 10 mg one daily is floor  No change doe / 02 req and no purulent chest secretions  Still sleeping on vent/ not sure it's helping at all at this point  rec Go ahead and take zpak as per med calendar and call if mucus not turning back to clear and we call you in augmentin x 10 days  Change the follow up appt to 6 weeks - call sooner if needed      10/29/2017  f/u ov/[name] re:  Copd /resp failure  Dyspnea:  MMRC4  = sob if tries to leave home or while getting  dressed   Cough: mucus white / mostly in am  Sleeping: on NIV SABA use: 3x daily hfa/ neb qod  02: 2lpm rec Increase xanax to 1 mg four times daliy with one extra if needed  Go ahead and try the ipatropium nasal spray as per [name]'s recs and let him know if not helping Follow the medication calendar   Late add:  Error in xanax instruction, rx is correct as written on prescription:  Xanax 0.25 mg take 1 four times a day     12/10/2017  Acute extended ov/[name] re:  Copd / respiratory failure ON 02 and Trilogy / has med calendar, not following action plans Did not increase xanax as rec to 0.25 mg qid / does better when takes xanax 0.25 mg prn  Chief Complaint  Patient presents with  . Acute Visit    Breathing has been worse x 10 days. He has been feeling weak.  He produced some green sputum today.  Dyspnea:  Doe across the room on 2lpm  Cough: rattling, min discolored mucus Sleeping: on trilogy - can't tell any difference on vs off  x for nasal congestion seems better on it  SABA use: xopenex tid hfa or neb / did not increase prednisone as rec for flare  02: 2lpm 24/7   rec rec try off trilogy Call for a different antibiotic if mucus stays green for 5 days after you finish the zpak  Discuss hospice with palliative care nurse  Increase the xanax to 0.25 mg four times daily   12/14/17 rx for zpak to take prn purulent sputum    01/21/2018  f/u ov/[name] re: severe copd / 02 and pred dep  Chief Complaint  Patient presents with  . Follow-up    Breathing has been worse for the past 5-6 days. He is using his xopenex inhaler 3 x daily on average. He is taking pred 10 mg daily.   Dyspnea:  Says Goes up 8 steps to go to bathroom s stopping  Cough: no spontaneous cough but "constant throat drainage"  Sleeping: bed flat on vent and off about the same amt of time  but seems to sleep easier, feel better when on it. SABA use: as above  02: 2lpm sats always ok    No obvious day to day or  daytime variability or assoc   purulent sputum or mucus plugs or hemoptysis or cp or chest tightness, subjective wheeze or overt sinus or hb symptoms.   Sleeping as above  without nocturnal  or early am exacerbation  of respiratory  c/o's or need for noct saba. Also denies any obvious fluctuation of symptoms with weather or environmental changes or other aggravating or alleviating factors except as outlined above   No unusual exposure hx or h/o childhood pna/ asthma or knowledge of premature birth.  Current Allergies, Complete Past Medical History, Past Surgical History, Family History, and Social History were reviewed in [employer] record.  ROS  The following are not active complaints unless bolded Hoarseness, sore throat, dysphagia, dental problems, itching, sneezing,  nasal congestion or discharge of excess mucus or purulent secretions, ear ache,   fever, chills, sweats, unintended wt loss or wt gain, classically pleuritic or exertional cp,  orthopnea pnd or arm/hand swelling  or leg swelling, presyncope, palpitations, abdominal pain, anorexia, nausea, vomiting, diarrhea  or change in bowel habits or change in bladder habits, change in stools or change in urine, dysuria, hematuria,  rash, arthralgias, visual complaints, headache, numbness, weakness or ataxia or problems with walking or coordination,  change in mood or  memory.        Current Meds  Medication Sig  . ALPRAZolam (XANAX) 0.25 MG tablet One four times daily  . aspirin 81 MG tablet Take 81 mg by mouth daily.  Marland Kitchen atorvastatin (LIPITOR) 20 MG tablet TAKE 1 TABLET BY MOUTH EVERY DAY  . Cholecalciferol (VITAMIN D3) 2000 units TABS Take 2,000 Units by mouth daily.  Marland Kitchen dextromethorphan-guaiFENesin (MUCINEX DM) 30-600 MG 12hr tablet Take 1-2 tablets by mouth every 12 (twelve) hours as needed for cough (w/ flutter valve).  . diclofenac sodium (VOLTAREN) 1 % GEL Apply 2 g topically 4 (four) times daily.  . finasteride  (PROSCAR) 5 MG tablet Take 5 mg by mouth every morning.  . furosemide (LASIX) 20 MG tablet TAKE 1 TABLET BY MOUTH EVERY DAY. MAY TAKE ADDITIONAL TABLET IF NEEDED FOR SWELLING  . levalbuterol (XOPENEX HFA) 45 MCG/ACT inhaler Inhale 2 puffs into the lungs every 4 (four) hours as needed for wheezing or shortness of breath.  . metoprolol succinate (TOPROL-XL) 25 MG 24 hr  tablet Take 12.5 mg by mouth daily.  . mometasone (NASONEX) 50 MCG/ACT nasal spray SHAKE LIQUID AND USE 2 SPRAYS IN EACH NOSTRIL DAILY  . Multiple Vitamins-Minerals (PRESERVISION AREDS 2) CAPS Take 1 capsule by mouth daily.  . nitroGLYCERIN (NITROSTAT) 0.4 MG SL tablet Place 1 tablet (0.4 mg total) under the tongue every 5 (five) minutes as needed for chest pain.  . OXYGEN Inhale 2 L continuous into the lungs.   . pantoprazole (PROTONIX) 40 MG tablet Take 40 mg by mouth daily.  . predniSONE (DELTASONE) 10 MG tablet For flare of cough/wheezing may increase to 2 daily until better, then 1 daily  . ranitidine (ZANTAC) 150 MG tablet Take 150 mg by mouth at bedtime.   Marland Kitchen SPIRIVA RESPIMAT 2.5 MCG/ACT AERS Inhale 2 puffs into the lungs daily.  Marland Kitchen spironolactone (ALDACTONE) 25 MG tablet Take 0.5 tablets (12.5 mg total) by mouth daily.  . SYMBICORT 160-4.5 MCG/ACT inhaler INHALE 2 PUFFS INTO THE LUNGS EVERY MORNING AND ANOTHER 2 PUFFS ABOUT 12 HOURS LATER            Objective:   Physical Exam  Frail elderly wm c/w bound quite hoarse and anxious    wt 159 04/28/2010  > 10/14/2010 154  > 10/04/2011 150> 12/21/2011  150 > 149 01/29/2012 >154 04/05/2012 > 05/22/2012 148 > 09/30/2012  146 >>144> 10/15/2012 > 12/18/2012  145 > 148 02/14/2013 >148 03/14/2013 > 04/25/2013 148 > 06/19/2013 150 >>07/17/2013 > 08/27/2013 148 > 10/23/2013  148 >153 12/09/2013 > 02/26/2014  153 > 06/01/2014  152 > 157 06/15/2014 > 09/21/2014 146 > 10/19/2014 151 >>148 11/19/14 > 02/24/2015 148 > 06/30/2015    140 >  10/04/2015  136 > 12/20/2015   142 > 06/13/2016  132 > 09/13/2016   133 >   11/07/2016  139 > 01/11/2017   135 > 02/20/2017  135 >  03/22/2017  132 > 05/25/2017 134  > 06/27/2017  135 > 08/28/2017   135 > 09/13/2017   128  > 10/29/2017  129 > 12/10/2017  133 > 01/21/2018 did not weigh   Vital signs reviewed - Note on arrival 02 sats  91% on 2lpm       HEENT: nl dentition / oropharynx. Nl external ear canals without cough reflex -  Mild/mod bilateral non-specific turbinate edema     NECK :  without JVD/Nodes/TM/ nl carotid upstrokes bilaterally   LUNGS: no acc muscle use,  Mod barrel  contour chest wall with bilateral  Distant bs s audible wheeze and  without cough on insp or exp maneuver and mod  Hyperresonant  to  percussion bilaterally     CV:  RRR  no s3 or murmur or increase in P2, and 1+ pitting on R, 2+ on L despite elastic support hose   ABD:  soft and nontender with pos mid insp Hoover's  in the supine position. No bruits or organomegaly appreciated, bowel sounds nl  MS:   Nl gait/  ext warm without deformities, calf tenderness, cyanosis or clubbing No obvious joint restrictions   SKIN: warm and dry without lesions    NEURO:  alert, approp, nl sensorium with  no motor or cerebellar deficits apparent.

## 2018-01-21 NOTE — Patient Instructions (Signed)
See calendar for specific medication instructions and bring it back for each and every office visit for every healthcare provider you see.  Without it,  you may not receive the best quality medical care that we feel you deserve.  You will note that the calendar groups together  your maintenance  medications that are timed at particular times of the day.  Think of this as your checklist for what your doctor has instructed you to do until your next evaluation to see what benefit  there is  to staying on a consistent group of medications intended to keep you well.  The other group at the bottom is entirely up to you to use as you see fit  for specific symptoms that may arise between visits that require you to treat them on an as needed basis.  Think of this as your action plan or "what if" list.   Separating the top medications from the bottom group is fundamental to providing you adequate care going forward.      See Tammy NP in 4 weeks with all your medications, even over the counter meds, separated in two separate bags, the ones you take no matter what vs the ones you stop once you feel better and take only as needed when you feel you need them.   Tammy  will generate for you a new user friendly medication calendar that will put [place] all on the same page re: your medication use.     Without this process, it simply isn't possible to assure that we are providing  your outpatient care  with  the attention to detail we feel you deserve.   If we cannot assure that you're getting that kind of care,  then we cannot manage your problem effectively from this clinic.  Once you have seen Tammy and we are sure that we're all on the same page with your medication use she will arrange follow up with me.

## 2018-01-22 ENCOUNTER — Encounter: Payer: Self-pay | Admitting: Internal Medicine

## 2018-01-22 ENCOUNTER — Other Ambulatory Visit: Payer: Medicare Other | Admitting: *Deleted

## 2018-01-22 DIAGNOSIS — I110 Hypertensive heart disease with heart failure: Secondary | ICD-10-CM | POA: Diagnosis not present

## 2018-01-22 DIAGNOSIS — Z515 Encounter for palliative care: Secondary | ICD-10-CM

## 2018-01-22 DIAGNOSIS — I739 Peripheral vascular disease, unspecified: Secondary | ICD-10-CM | POA: Diagnosis not present

## 2018-01-22 DIAGNOSIS — I5032 Chronic diastolic (congestive) heart failure: Secondary | ICD-10-CM | POA: Diagnosis not present

## 2018-01-22 DIAGNOSIS — J9611 Chronic respiratory failure with hypoxia: Secondary | ICD-10-CM | POA: Diagnosis not present

## 2018-01-22 DIAGNOSIS — J441 Chronic obstructive pulmonary disease with (acute) exacerbation: Secondary | ICD-10-CM | POA: Diagnosis not present

## 2018-01-22 DIAGNOSIS — I2510 Atherosclerotic heart disease of native coronary artery without angina pectoris: Secondary | ICD-10-CM | POA: Diagnosis not present

## 2018-01-22 NOTE — Assessment & Plan Note (Addendum)
Quit smoking 1984/ MM - PFTs  05/04/05 FEV1 36% ratio 28% with 29% improvement after bronchodilators DLCO 48%  - PFT's 03/26/08 30% ratio 34 with 14% improvement after bronchodilators DLC0 38 %  - Referred to rehab 12/19/2012 > completed March 2015  - changed to spiriva respimat 02/14/2013  - started daily prednisone 06/19/13 > improved only a little> tapered off mid July 2015 > ok to change to prn prednisone 08/27/2013  - flared off nasonex and gerd rx 09/21/2014 > resumed - 10/19/2014  p extensive coaching HFA effectiveness =    90%  - changed pred to ceiling of 20 / floor of 5 mg daily as of 10/19/2014 > changed to floor of 10 mg daily 10/04/2015  - PFT's  06/08/2015  FEV1 0.58 (22 % ) ratio 27  p No sign % improvement from saba p symb/spiriva prior to study with DLCO  9/9 % corrects to 27 % for alv volume   - changed pred to ceiling of 20 and floor of 5 mg daily 06/13/16 > increased floor to 10 mg daily effective 02/20/2017  - referred back to rehab 09/13/2016  - try off noct vent and start titrating up xanax 01/11/2017 > no change but then admitted with flu and placed back on vent  - alpha one AT 03/01/17  MM        01/21/2018  After extensive coaching inhaler device,  effectiveness =    90% from a baseline of 75%    Reports today he can go up a flight of steps s stopping but comes in w/c as can't get across the lobby suggesting a strong anxiety component to his complaints which is difficult to sort out from his emphysema as anxiety raises RR which make the air trapping problem worse which make the pt more anxious in a chicken and egg format.  Nothing else to offer other than to go over his meds on action plan once again line by line where all of his complaints have been addressed and emphasized use of extra xanax if he can't get comfortable plus reconsider hospice / palliative care approach.

## 2018-01-22 NOTE — Assessment & Plan Note (Signed)
-   Start noct 02 at 2lpm 2008 and on 03/04/09 desat @ > 185 ft so rec wear with activtiy > rm to rm  - 02/15/2011   Walked RA x one lap @ 185 stopped due to  desat > corrected on 2lpm - 02/26/2014  Walked 2lpm  2 laps @ 185 ft each stopped due to  Sob/ sats 88% at nl pace  - 10/19/2014   Walked RA  2 laps @ 185 ft each stopped due to  End of study, slow pace,  sob  But no  desat   05/21/15 Eval by Dr Dedio/ rec trial of NIV  - HCO3  10/27/15 = 34  - HC03  10/06/16  = 28 ? On noct vent (non compliant) - As of admit  02/04/17 back on noct vent/ 02 2lpm 24/7 - HC03 36  08/28/2017 despite reporting using noct vent  - HC03 31  01/04/18    Adequate control on present rx, reviewed in detail with pt > no change in rx needed     I had an extended discussion with the patient reviewing all relevant studies completed to date and  lasting 15 to 20 minutes of a 25 minute visit    See device teaching which extended face to face time for this visit   Each maintenance medication was reviewed in detail including most importantly the difference between maintenance and prns and under what circumstances the prns are to be triggered using an action plan format that is not reflected in the computer generated alphabetically organized AVS but trather by a customized med calendar that reflects the AVS meds with confirmed 100% correlation.   In addition, Please see AVS for unique instructions that I personally wrote and verbalized to the the pt in detail and then reviewed with pt  by my nurse highlighting any  changes in therapy recommended at today's visit to their plan of care.

## 2018-01-26 DIAGNOSIS — I700 Atherosclerosis of aorta: Secondary | ICD-10-CM | POA: Diagnosis not present

## 2018-01-26 DIAGNOSIS — R0781 Pleurodynia: Secondary | ICD-10-CM | POA: Diagnosis not present

## 2018-01-26 DIAGNOSIS — R918 Other nonspecific abnormal finding of lung field: Secondary | ICD-10-CM | POA: Diagnosis not present

## 2018-01-26 DIAGNOSIS — R54 Age-related physical debility: Secondary | ICD-10-CM | POA: Diagnosis not present

## 2018-01-26 DIAGNOSIS — I951 Orthostatic hypotension: Secondary | ICD-10-CM | POA: Diagnosis not present

## 2018-01-26 DIAGNOSIS — S51811A Laceration without foreign body of right forearm, initial encounter: Secondary | ICD-10-CM | POA: Diagnosis not present

## 2018-01-26 DIAGNOSIS — Z951 Presence of aortocoronary bypass graft: Secondary | ICD-10-CM | POA: Diagnosis not present

## 2018-01-28 NOTE — Progress Notes (Signed)
COMMUNITY PALLIATIVE CARE RN NOTE  PATIENT NAME: [name] DOB: [birth year] MRN: [id number]  PRIMARY CARE PROVIDER: [name], MD  RESPONSIBLE PARTY:  Acct ID - Guarantor Home Phone Work Phone Relationship Acct Type  [id number] - [name] [phone]  Self P/F     [place], [name], [place] [address]    PLAN OF CARE and INTERVENTION:  1. ADVANCE CARE PLANNING/GOALS OF CARE: He wants to remain in his home with his wife. He is a Full Code 2. PATIENT/CAREGIVER EDUCATION: Reinforced Safe Mobility, Energy Conservation and Breathing Techniques 3. DISEASE STATUS: Met with patient and his wife in their home. Patient is currently receiving a Physical Therapy session, to improve strength and endurance from Therapist with [facility]. He was able to walk for about 5 minutes in his home, but then c/o bilateral leg weakness and had to sit down. He is dyspneic with any exertion and at times during conversation. He continues on O2 at 2L and uses a Trilogy machine during the night. He was seen by his Allergist last week and d/t increased mucus production he was placed on Amoxicillin. Patient states that he was only able to tolerate this antibiotic for 3 days then he noticed swelling in his hands and stopped taking this. He also recommended that patient start Nasol which is a homeopathic nasal spray that has Capsaicin as one of the ingredients. Patient has not tried this as of yet, in fear that it will cause his nose to burn. He is currently using a Saline Solution and Flonase or Nasonex to help with his chronic sinus congestion. He was seen by his Pulmonologist yesterday. No changes made to this medication regimen. His intake remains normal. He does report his dizziness being worse than normal along with sinus pressure and "fogginess." Will continue to monitor.   HISTORY OF PRESENT ILLNESS:  This is a 85 yo male who resides at home with his wife. Palliative Care Team continues to follow  patient. Next visit scheduled in 2 weeks.   CODE STATUS: FULL CODE ADVANCED DIRECTIVES: N MOST FORM: no PPS: 50%   PHYSICAL EXAM:   VITALS: Today's Vitals   01/22/18 1548  BP: 102/60  Pulse: 69  Resp: 20  Temp: 97.6 F (36.4 C)  TempSrc: Temporal  SpO2: 94%  PainSc: 0-No pain    LUNGS: decreased breath sounds CARDIAC: Cor RRR EXTREMITIES: 1+ edema in [name] lower extremity and Trace edema in R lower extremity SKIN: Thin/frail skin, bruising noted to bilateral hands  NEURO: Alert and oriented x 3, HOH, pleasant mood, ambulatory   (Duration of visit and documentation)    [name], RN, BSN

## 2018-01-29 ENCOUNTER — Encounter (HOSPITAL_BASED_OUTPATIENT_CLINIC_OR_DEPARTMENT_OTHER): Payer: Medicare Other | Attending: Internal Medicine

## 2018-01-29 DIAGNOSIS — H838X3 Other specified diseases of inner ear, bilateral: Secondary | ICD-10-CM | POA: Diagnosis not present

## 2018-01-29 DIAGNOSIS — I10 Essential (primary) hypertension: Secondary | ICD-10-CM | POA: Insufficient documentation

## 2018-01-29 DIAGNOSIS — H903 Sensorineural hearing loss, bilateral: Secondary | ICD-10-CM | POA: Diagnosis not present

## 2018-01-29 DIAGNOSIS — Z87891 Personal history of nicotine dependence: Secondary | ICD-10-CM | POA: Insufficient documentation

## 2018-01-29 DIAGNOSIS — S41101A Unspecified open wound of right upper arm, initial encounter: Secondary | ICD-10-CM | POA: Insufficient documentation

## 2018-01-29 DIAGNOSIS — J342 Deviated nasal septum: Secondary | ICD-10-CM | POA: Diagnosis not present

## 2018-01-29 DIAGNOSIS — X58XXXA Exposure to other specified factors, initial encounter: Secondary | ICD-10-CM | POA: Insufficient documentation

## 2018-01-29 DIAGNOSIS — J310 Chronic rhinitis: Secondary | ICD-10-CM | POA: Diagnosis not present

## 2018-01-29 DIAGNOSIS — J449 Chronic obstructive pulmonary disease, unspecified: Secondary | ICD-10-CM | POA: Insufficient documentation

## 2018-01-29 DIAGNOSIS — I7389 Other specified peripheral vascular diseases: Secondary | ICD-10-CM | POA: Insufficient documentation

## 2018-01-29 DIAGNOSIS — Z9981 Dependence on supplemental oxygen: Secondary | ICD-10-CM | POA: Insufficient documentation

## 2018-01-30 DIAGNOSIS — X58XXXA Exposure to other specified factors, initial encounter: Secondary | ICD-10-CM | POA: Diagnosis not present

## 2018-01-30 DIAGNOSIS — I7389 Other specified peripheral vascular diseases: Secondary | ICD-10-CM | POA: Diagnosis not present

## 2018-01-30 DIAGNOSIS — I10 Essential (primary) hypertension: Secondary | ICD-10-CM | POA: Diagnosis not present

## 2018-01-30 DIAGNOSIS — Z87891 Personal history of nicotine dependence: Secondary | ICD-10-CM | POA: Diagnosis not present

## 2018-01-30 DIAGNOSIS — S41101A Unspecified open wound of right upper arm, initial encounter: Secondary | ICD-10-CM | POA: Diagnosis not present

## 2018-01-30 DIAGNOSIS — Z9981 Dependence on supplemental oxygen: Secondary | ICD-10-CM | POA: Diagnosis not present

## 2018-01-30 DIAGNOSIS — S51811A Laceration without foreign body of right forearm, initial encounter: Secondary | ICD-10-CM | POA: Diagnosis not present

## 2018-01-30 DIAGNOSIS — J449 Chronic obstructive pulmonary disease, unspecified: Secondary | ICD-10-CM | POA: Diagnosis not present

## 2018-02-01 ENCOUNTER — Other Ambulatory Visit: Payer: Medicare Other | Admitting: *Deleted

## 2018-02-01 DIAGNOSIS — Z515 Encounter for palliative care: Secondary | ICD-10-CM

## 2018-02-01 NOTE — Progress Notes (Signed)
COMMUNITY PALLIATIVE CARE RN NOTE  PATIENT NAME: [name] DOB: 06/20/1929 MRN: [id number]  PRIMARY CARE PROVIDER: [name], MD  RESPONSIBLE PARTY: Kaileb Monsanto (wife) Acct ID - Guarantor Home Phone Work Phone Relationship Acct Type  [id number] - Barnhardt,Nahuel L [phone]  Self P/F     [place], [name], [place] [address]    PLAN OF CARE and INTERVENTION:  1. ADVANCE CARE PLANNING/GOALS OF CARE: Remain at home with his wife. He is a Full Code. 2. PATIENT/CAREGIVER EDUCATION: Reinforced Safe Mobility, Fall Prevention and Energy Conservation 3. DISEASE STATUS: Met with patient and his wife in their home. Patient had a fall last week. He stood up around 7:30a to go to the bathroom, became dizzy and fell to the floor. He sustained a large skin tear to his R forearm. Also began having pain in his R side underneath his rib cage. He was seen at an Urgent Care. X-ray obtained which was negative for fractures. He went to a Wound Specialist yesterday. Dressing to be changed every other day. He recently had an appointment with his ENT. His scan was negative for infection or polyps. ENT feels sinus irritation may be due to continuous oxygen use. He continues with sinus pressure and is using saline rinses, Flonase or Nasonex, with minimal effect. He is no longer receiving in home PT. He states that he has reached his level of coverage. He has attempted to continue leg exercises, but has not been able to walk on the treadmill due to weakness. He reports increased L leg swelling and took an extra Furosemide today to help. He has been unable to weigh daily, due to his scale being broken. Last MD visit weight 134 lbs, fully clothed. Weight is usually around 127-129 lbs at home. Intake is normal. Will continue to monitor.  HISTORY OF PRESENT ILLNESS:  This is a 85 yo male who resides at home with his wife. Palliative Care Team continues to follow patient. Next visit scheduled in 2 weeks.   CODE  STATUS: FULL CODE ADVANCED DIRECTIVES: N MOST FORM: no PPS: 50%   PHYSICAL EXAM:   VITALS: Today's Vitals   02/01/18 1545  BP: 118/62  Pulse: 84  Resp: 20  Temp: 98.6 F (37 C)  TempSrc: Temporal  SpO2: 99%  PainSc: 5     LUNGS: decreased breath sounds CARDIAC: Cor RRR EXTREMITIES: 2+ edema in left lower extremity SKIN: Skin tear to R forearm, dressing dry and intact; Thin/frail skin  NEURO: Alert and oriented x 3, pleasant mood, generalized weakness, ambulatory   (Duration of visit and documentation 75 minutes)    [name], RN, BSN

## 2018-02-04 DIAGNOSIS — M7918 Myalgia, other site: Secondary | ICD-10-CM | POA: Diagnosis not present

## 2018-02-04 DIAGNOSIS — S51811D Laceration without foreign body of right forearm, subsequent encounter: Secondary | ICD-10-CM | POA: Diagnosis not present

## 2018-02-04 DIAGNOSIS — R5383 Other fatigue: Secondary | ICD-10-CM | POA: Diagnosis not present

## 2018-02-14 ENCOUNTER — Other Ambulatory Visit: Payer: Medicare Other | Admitting: *Deleted

## 2018-02-15 ENCOUNTER — Other Ambulatory Visit: Payer: Medicare Other | Admitting: *Deleted

## 2018-02-15 DIAGNOSIS — Z515 Encounter for palliative care: Secondary | ICD-10-CM

## 2018-02-16 ENCOUNTER — Other Ambulatory Visit: Payer: Self-pay | Admitting: Internal Medicine

## 2018-02-18 ENCOUNTER — Ambulatory Visit: Payer: Medicare Other | Admitting: Adult Health

## 2018-02-20 NOTE — Progress Notes (Signed)
COMMUNITY PALLIATIVE CARE RN NOTE  PATIENT NAME: [name] DOB: Oct 11, 1929 MRN: [id number]  PRIMARY CARE PROVIDER: [name], MD  RESPONSIBLE PARTY: [name] Acct ID - Guarantor Home Phone Work Phone Relationship Acct Type  [id number] - [name] [phone]  Self P/F     [place], [name], [place] [address]    PLAN OF CARE and INTERVENTION:  1. ADVANCE CARE PLANNING/GOALS OF CARE: He wants to remain at home with his wife. He is a Full Code. 2. PATIENT/CAREGIVER EDUCATION: Reinforced Safe Mobility and Breathing Techniques 3. DISEASE STATUS: Met with patient and wife in their home. He is sitting up in the chair in his living room awake and alert. Denies pain at this time while at rest. Does experience pain at times in his lower back, shoulders and neck, especially when waking up in the morning. He continues with sinus congestion. He has tried using a new nasal spray, Nasol. He feels that it may have helped the first 1-2 days, but no longer seems effective. He is back to doing his saline nose rinses and using Flonase or Nasonex. Remains on Oxygen at 2L/min continuously. Wears his Trilogy machine during the night. Dyspnea present at times during conversation and with exertion. He has tried increasing dose of Xanax to 1 tablet 3x/day, however this made him too drowsy. He is back taking 1/2 tablet in the am and pm and 1 tablet mid-day. His intake is normal. Continued bilateral leg weakness. He still tries to perform leg exercises shown to him by PT, while he is sitting down. He is not started back walking on his treadmill as of yet. He spoke of recent death of his brother, which made him tearful. Skin tear to R forearm from recent fall is healing well. Wife continues to change dressing every other day. He has a bandaid noted to his R pointer finger where he cut it using a bottle opener. Left ankle swelling increased. He has taken an additional dose of Lasix to see if this will help.  He has an appointment with his Cardiologist next month. Will continue to monitor.  HISTORY OF PRESENT ILLNESS:  This is a 85 yo male who resides at home with his wife. Palliative Care Team continues to follow patient. Will visit again within next 2-3 weeks.   CODE STATUS: Full Code ADVANCED DIRECTIVES: N MOST FORM: no PPS: 50%   PHYSICAL EXAM:   VITALS: Today's Vitals   02/15/18 1554  BP: 122/60  Pulse: 89  Resp: 18  Temp: (!) 97.5 F (36.4 C)  TempSrc: Temporal  SpO2: 93%  PainSc: 0-No pain    LUNGS: clear to auscultation  CARDIAC: Cor RRR EXTREMITIES: 2+ edema [name] ankle/foot SKIN: Skin tear to R forearm healing well, New skin tear on R pointer finger covered with bandaid  NEURO: Alert and oriented x 3, generalized weakness, HOH, ambulatory   (Duration of visit and documentation 60 minutes)    [name], RN, BSN

## 2018-02-21 ENCOUNTER — Ambulatory Visit (INDEPENDENT_AMBULATORY_CARE_PROVIDER_SITE_OTHER): Payer: Medicare Other | Admitting: Podiatry

## 2018-02-21 DIAGNOSIS — B351 Tinea unguium: Secondary | ICD-10-CM | POA: Diagnosis not present

## 2018-02-21 DIAGNOSIS — M79675 Pain in left toe(s): Secondary | ICD-10-CM | POA: Diagnosis not present

## 2018-02-21 DIAGNOSIS — M79674 Pain in right toe(s): Secondary | ICD-10-CM | POA: Diagnosis not present

## 2018-02-21 NOTE — Patient Instructions (Signed)

## 2018-02-22 ENCOUNTER — Encounter: Payer: Self-pay | Admitting: Adult Health

## 2018-02-22 ENCOUNTER — Ambulatory Visit (INDEPENDENT_AMBULATORY_CARE_PROVIDER_SITE_OTHER): Payer: Medicare Other | Admitting: Adult Health

## 2018-02-22 VITALS — HR 61 | Ht 69.0 in | Wt 136.4 lb

## 2018-02-22 DIAGNOSIS — J9611 Chronic respiratory failure with hypoxia: Secondary | ICD-10-CM | POA: Diagnosis not present

## 2018-02-22 DIAGNOSIS — J449 Chronic obstructive pulmonary disease, unspecified: Secondary | ICD-10-CM

## 2018-02-22 DIAGNOSIS — I509 Heart failure, unspecified: Secondary | ICD-10-CM | POA: Diagnosis not present

## 2018-02-22 DIAGNOSIS — I5032 Chronic diastolic (congestive) heart failure: Secondary | ICD-10-CM | POA: Diagnosis not present

## 2018-02-22 DIAGNOSIS — J9612 Chronic respiratory failure with hypercapnia: Secondary | ICD-10-CM | POA: Diagnosis not present

## 2018-02-22 LAB — BASIC METABOLIC PANEL
BUN: 21 mg/dL (ref 6–23)
CO2: 36 mEq/L — ABNORMAL HIGH (ref 19–32)
Calcium: 10.4 mg/dL (ref 8.4–10.5)
Chloride: 97 mEq/L (ref 96–112)
Creatinine, Ser: 1.06 mg/dL (ref 0.40–1.50)
GFR: 66.16 mL/min (ref >60.00)
Glucose, Bld: 128 mg/dL — ABNORMAL HIGH (ref 70–99)
Potassium: 4 mEq/L (ref 3.5–5.1)
Sodium: 139 mEq/L (ref 135–145)

## 2018-02-22 NOTE — Patient Instructions (Addendum)
Follow med calendar closely and bring to each visit.  Continue on Oxygen 2l/m  Extra lasix 20mg  for 3 days then daily as needed for increased swelling  Increase Aldactone 25mg  daily .  Labs today .  Low salt diet, keep legs elevated.  Continue on Trilogy Vent .At bedtime  . May use during daytime with naps .  Please contact office for sooner follow up if symptoms do not improve or worsen or seek emergency care   Follow up with Orianna Biskup NP in 1 week with labs (bmet)  Follow up Dr. [name]  In 3 months and and As needed.  Please contact office for sooner follow up if symptoms do not improve or worsen or seek emergency care

## 2018-02-22 NOTE — Assessment & Plan Note (Signed)
Very severe COPD - appears stable without flare . Does appear to have component of decompensated D CHF  Patient's medications were reviewed today and patient education was given. Computerized medication calendar was adjusted/completed   Plan  Patient Instructions  Follow med calendar closely and bring to each visit.  Continue on Oxygen 2l/m  Extra lasix 20mg  for 3 days then daily as needed for increased swelling  Increase Aldactone 25mg  daily .  Labs today .  Low salt diet, keep legs elevated.  Continue on Trilogy Vent .At bedtime  . May use during daytime with naps .  Please contact office for sooner follow up if symptoms do not improve or worsen or seek emergency care   Follow up with Samuele Storey NP in 1 week with labs (bmet)  Follow up Dr. [name]  In 3 months and and As needed.  Please contact office for sooner follow up if symptoms do not improve or worsen or seek emergency care

## 2018-02-22 NOTE — Assessment & Plan Note (Signed)
Cont on O2   cont on TRILOGY At bedtime  And naps.

## 2018-02-22 NOTE — Assessment & Plan Note (Signed)
Appears mildly decompensated , adjust diuretics gently  Check bmet today and on return in 1 week   Plan  Patient Instructions  Follow med calendar closely and bring to each visit.  Continue on Oxygen 2l/m  Extra lasix 20mg  for 3 days then daily as needed for increased swelling  Increase Aldactone 25mg  daily .  Labs today .  Low salt diet, keep legs elevated.  Continue on Trilogy Vent .At bedtime  . May use during daytime with naps .  Please contact office for sooner follow up if symptoms do not improve or worsen or seek emergency care   Follow up with Daiveon Markman NP in 1 week with labs (bmet)  Follow up Dr. [name]  In 3 months and and As needed.  Please contact office for sooner follow up if symptoms do not improve or worsen or seek emergency care

## 2018-02-22 NOTE — Progress Notes (Signed)
@Patient  ID: [name], male    DOB: 1929-02-14, 85 y.o.   MRN: [id number]  Chief Complaint  Patient presents with  . Follow-up    COPD     Referring provider: [name], MD  HPI: 85yo male former smoker (1983) followed for GOLD III COPD w/ asthma component , O2 dependent RF with act and At bedtime    TEST /Events  PFTs 05/04/05 FEV1 36% ratio 28% with 29% improvement after bronchodilators DLCO 48%  - PFT's 03/26/08 30% ratio 34 with 14% improvement after bronchodilators DLC0 38 %  - Referred to rehab 12/19/2012 >completed March 2015  - changed to spiriva respimat 02/14/2013  - started daily prednisone 06/19/13 >improved only a little>tapered off mid July 2015 >ok to change to prn prednisone 08/27/2013  - flared off nasonex and gerd rx 09/21/2014 > resumed - 10/19/2014 p extensive coaching HFA effectiveness = 90%  - changed pred to ceiling of 20 / floor of 5 mg daily as of 10/19/2014 >changed to floor of 10 mg daily 10/04/2015  - PFT's 06/08/2015 FEV1 0.58 (22 % ) ratio 27 p No sign % improvement from saba p symb/spiriva prior to study with DLCO 9/9 % corrects to 27 % for alv volume  Start noct 02 at 2lpm 2008 and on 03/04/09 desat @ >185 ft so rec wear with activtiy >rm to rm  - 2/6/2013Walked RA x one lap @ 185 stopped due to desat >corrected on 2lpm - 02/26/2014 Walked 2lpm 2 laps @ 185 ft each stopped due to Sob/ sats 88% at nl pace  - 10/10/2016Walked RA 2 laps @ 185 ft each stopped due to End of study, slow pace, sob But no desat  - HCO3 10/27/15 = 34   02/22/2018 Follow up : COPD , O2 RF  Patient presents for a one-month follow-up. Says over last week breathing has been worse, feels weaker, increased ankle edema, more short of breath with activity , decreased activity tolerance.  No increased cough, congestion  Or fever. No discolored mucus  Has very severe COPD , using symbicort and spiriva . On chronic steroid with prednisone 10mg   daily .   Patient is followed by palliative care at home. Wife says they are helpful .   He remains on oxygen 2 L.. no change in oxygen demands.   Uses Trilogy vent at bedtime. Feels this helps.   Has chronic diastolic heart failure.  Is on Lasix 20mg  daily  and Aldactone 25mg  daily .  Weight slowly trending up , 8-9lbs.   Getting weaker , fell at home and went to urgent care . Had skin tear on right arm. It is healing up.  No loc.   Allergies  Allergen Reactions  . Cefdinir Other (See Comments)    Severe Diarrhea  . Nitrofurantoin Swelling    Hand Swelling  . Sulfa Antibiotics Anaphylaxis and Swelling  . Sulfonamide Derivatives Swelling    REACTION: facial/tongue swelling  . Cefdinir Diarrhea    REACTION: diarrhea  . Ciprofloxacin Itching and Rash    Red itchy hands  . Nitrofurantoin Swelling    Swollen hands  . Doxycycline Other (See Comments)    Felt terrible  . Sertraline Other (See Comments)    jittery  . Ciprofloxacin Itching and Rash  . Levaquin [Levofloxacin] Itching and Rash  . Levaquin [Levofloxacin] Itching and Rash    Immunization History  Administered Date(s) Administered  . Influenza Split 10/10/2010, 10/04/2011, 10/14/2013  . Influenza Whole 10/01/2007,  11/06/2008, 11/09/2009  . Influenza, High Dose Seasonal PF 11/07/2016, 10/18/2017  . Influenza,inj,Quad PF,6+ Mos 09/30/2012, 10/19/2014  . Influenza-Unspecified 09/07/2015  . Pneumococcal Conjugate-13 08/27/2013  . Pneumococcal Polysaccharide-23 12/11/2001, 05/20/2015    Past Medical History:  Diagnosis Date  . Arthritis    "generalized" (04/04/2017)  . Asthma   . Basal cell carcinoma    "left ear"  . BPH (benign prostatic hyperplasia)    severe; s/p multiple biopsies  . CAD (coronary artery disease)   . Carotid artery occlusion   . Chronic respiratory failure ([place])   . Chronic rhinitis   . COPD (chronic obstructive pulmonary disease) (HCC)    2L [place] O2  . Diastolic heart failure ([place]) 2019   . Dilation of biliary tract   . Elevated troponin 03/09/2017  . Gallstones   . GERD (gastroesophageal reflux disease)   . History of blood transfusion    "w/his CABG" (04/04/2017)  . History of kidney stones   . Hyperlipidemia   . Hypertension   . On home oxygen therapy    "~ 24/7" (04/04/2017)  . Peripheral vascular disease ([place])   . Pneumonia 2019  . Syncope and collapse   . Ureteral tumor 08/2015   had endoscopic procedure for evaluation, unable to reach for biopsy    Tobacco History: Social History   Tobacco Use  Smoking Status Former Smoker  . Packs/day: 2.00  . Years: 33.00  . Pack years: 66.00  . Types: Cigarettes  . Last attempt to quit: 03/25/1982  . Years since quitting: 35.9  Smokeless Tobacco Never Used   Counseling given: Not Answered   Outpatient Medications Prior to Visit  Medication Sig Dispense Refill  . ALPRAZolam (XANAX) 0.25 MG tablet TAKE 1 TABLET BY MOUTH THREE TIMES DAILY AS NEEDED 90 tablet 2  . aspirin 81 MG tablet Take 81 mg by mouth daily.    Marland Kitchen atorvastatin (LIPITOR) 20 MG tablet TAKE 1 TABLET BY MOUTH EVERY DAY 90 tablet 2  . Cholecalciferol (VITAMIN D3) 2000 units TABS Take 2,000 Units by mouth daily.    Marland Kitchen dextromethorphan-guaiFENesin (MUCINEX DM) 30-600 MG 12hr tablet Take 1-2 tablets by mouth every 12 (twelve) hours as needed for cough (w/ flutter valve).    . diclofenac sodium (VOLTAREN) 1 % GEL Apply 2 g topically 4 (four) times daily. 1 Tube 0  . finasteride (PROSCAR) 5 MG tablet Take 5 mg by mouth every morning.    . furosemide (LASIX) 20 MG tablet TAKE 1 TABLET BY MOUTH EVERY DAY. MAY TAKE ADDITIONAL TABLET IF NEEDED FOR SWELLING 45 tablet 2  . levalbuterol (XOPENEX HFA) 45 MCG/ACT inhaler Inhale 2 puffs into the lungs every 4 (four) hours as needed for wheezing or shortness of breath. 15 g 4  . metoprolol succinate (TOPROL-XL) 25 MG 24 hr tablet Take 12.5 mg by mouth daily.    . mometasone (NASONEX) 50 MCG/ACT nasal spray SHAKE LIQUID  AND USE 2 SPRAYS IN EACH NOSTRIL DAILY 17 g 11  . Multiple Vitamins-Minerals (PRESERVISION AREDS 2) CAPS Take 1 capsule by mouth daily.    . nitroGLYCERIN (NITROSTAT) 0.4 MG SL tablet Place 1 tablet (0.4 mg total) under the tongue every 5 (five) minutes as needed for chest pain. 25 tablet 3  . OXYGEN Inhale 2 L continuous into the lungs.     . pantoprazole (PROTONIX) 40 MG tablet Take 40 mg by mouth daily.    . predniSONE (DELTASONE) 10 MG tablet For flare of cough/wheezing may increase to 2  daily until better, then 1 daily 100 tablet 2  . SPIRIVA RESPIMAT 2.5 MCG/ACT AERS Inhale 2 puffs into the lungs daily.  11  . spironolactone (ALDACTONE) 25 MG tablet Take 0.5 tablets (12.5 mg total) by mouth daily. 45 tablet 3  . SYMBICORT 160-4.5 MCG/ACT inhaler INHALE 2 PUFFS INTO THE LUNGS EVERY MORNING AND ANOTHER 2 PUFFS ABOUT 12 HOURS LATER 10.2 g 11  . ranitidine (ZANTAC) 150 MG tablet Take 150 mg by mouth at bedtime.   0   No facility-administered medications prior to visit.      Review of Systems:   Constitutional:   No  weight loss, night sweats,  Fevers, chills,  +fatigue, or  lassitude.  HEENT:   No headaches,  Difficulty swallowing,  Tooth/dental problems, or  Sore throat,                No sneezing, itching, ear ache, nasal congestion, post nasal drip,   CV:  No chest pain,  Orthopnea, PND, ++swelling in lower extremities,  No anasarca, dizziness, palpitations, syncope.   GI  No heartburn, indigestion, abdominal pain, nausea, vomiting, diarrhea, change in bowel habits, loss of appetite, bloody stools.   Resp:    No chest wall deformity  Skin: no rash or lesions.  GU: no dysuria, change in color of urine, no urgency or frequency.  No flank pain, no hematuria   MS:  No joint pain or swelling.  No decreased range of motion.  No back pain.    Physical Exam  Pulse 61   Ht 5\' 9"  (1.753 m)   Wt 136 lb 6.4 oz (61.9 kg)   SpO2 97%   BMI 20.14 kg/m   GEN: A/Ox3; pleasant , NAD,  thin and frail .    HEENT:  [place]/AT,  EACs-clear, TMs-wnl, NOSE-clear, THROAT-clear, no lesions, no postnasal drip or exudate noted.   NECK:  Supple w/ fair ROM; no JVD; normal carotid impulses w/o bruits; no thyromegaly or nodules palpated; no lymphadenopathy.    RESP  Diminished BS in bases   no accessory muscle use, no dullness to percussion  CARD:  RRR, no m/r/g, 2+ peripheral edema, pulses intact, no cyanosis or clubbing.  GI:   Soft & nt; nml bowel sounds; no organomegaly or masses detected.   Musco: Warm bil, no deformities or joint swelling noted.   Neuro: alert, no focal deficits noted.    Skin: Warm, no lesions or rashes    Lab Results:  CBC   BNP  ProBNP  Imaging: No results found.    PFT Results Latest Ref Rng & Units 06/08/2015  FVC-Pre L 1.90  FVC-Predicted Pre % 51  FVC-Post L 2.15  FVC-Predicted Post % 58  Pre FEV1/FVC % % 28  Post FEV1/FCV % % 27  FEV1-Pre L 0.54  FEV1-Predicted Pre % 20  FEV1-Post L 0.58  DLCO UNC% % 9  DLCO COR %Predicted % 27  TLC L 6.87  TLC % Predicted % 99  RV % Predicted % 171    No results found for: NITRICOXIDE      Assessment & Plan:   COPD   GOLD IV/ 02/steroid dep  Very severe COPD - appears stable without flare . Does appear to have component of decompensated D CHF  Patient's medications were reviewed today and patient education was given. Computerized medication calendar was adjusted/completed   Plan  Patient Instructions  Follow med calendar closely and bring to each visit.  Continue on Oxygen 2l/m  Extra lasix 20mg  for 3 days then daily as needed for increased swelling  Increase Aldactone 25mg  daily .  Labs today .  Low salt diet, keep legs elevated.  Continue on Trilogy Vent .At bedtime  . May use during daytime with naps .  Please contact office for sooner follow up if symptoms do not improve or worsen or seek emergency care   Follow up with Felicia Bloomquist NP in 1 week with labs (bmet)  Follow up Dr.  [name]  In 3 months and and As needed.  Please contact office for sooner follow up if symptoms do not improve or worsen or seek emergency care       Chronic respiratory failure with hypoxia and hypercapnia (HCC) Cont on O2   cont on TRILOGY At bedtime  And naps.   Chronic diastolic CHF (congestive heart failure) ([place]) Appears mildly decompensated , adjust diuretics gently  Check bmet today and on return in 1 week   Plan  Patient Instructions  Follow med calendar closely and bring to each visit.  Continue on Oxygen 2l/m  Extra lasix 20mg  for 3 days then daily as needed for increased swelling  Increase Aldactone 25mg  daily .  Labs today .  Low salt diet, keep legs elevated.  Continue on Trilogy Vent .At bedtime  . May use during daytime with naps .  Please contact office for sooner follow up if symptoms do not improve or worsen or seek emergency care   Follow up with Terrez Ander NP in 1 week with labs (bmet)  Follow up Dr. [name]  In 3 months and and As needed.  Please contact office for sooner follow up if symptoms do not improve or worsen or seek emergency care          Rexene Edison, NP 02/22/2018

## 2018-02-25 ENCOUNTER — Telehealth: Payer: Self-pay | Admitting: Primary Care

## 2018-02-25 ENCOUNTER — Encounter: Payer: Self-pay | Admitting: Podiatry

## 2018-02-25 NOTE — Progress Notes (Signed)
Subjective: [name] presents today with painful, thick toenails 1-5 b/l that he cannot cut and which interfere with daily activities.  Pain is aggravated when wearing enclosed shoe gear.  He is accompanied by his wife on today.  [name], MD is his PCP.   Current Outpatient Medications:  .  ALPRAZolam (XANAX) 0.25 MG tablet, TAKE 1 TABLET BY MOUTH THREE TIMES DAILY AS NEEDED, Disp: 90 tablet, Rfl: 2 .  aspirin 81 MG tablet, Take 81 mg by mouth daily., Disp: , Rfl:  .  atorvastatin (LIPITOR) 20 MG tablet, TAKE 1 TABLET BY MOUTH EVERY DAY, Disp: 90 tablet, Rfl: 2 .  Cholecalciferol (VITAMIN D3) 2000 units TABS, Take 2,000 Units by mouth daily., Disp: , Rfl:  .  dextromethorphan-guaiFENesin (MUCINEX DM) 30-600 MG 12hr tablet, Take 1-2 tablets by mouth every 12 (twelve) hours as needed for cough (w/ flutter valve)., Disp: , Rfl:  .  diclofenac sodium (VOLTAREN) 1 % GEL, Apply 2 g topically 4 (four) times daily., Disp: 1 Tube, Rfl: 0 .  finasteride (PROSCAR) 5 MG tablet, Take 5 mg by mouth every morning., Disp: , Rfl:  .  furosemide (LASIX) 20 MG tablet, TAKE 1 TABLET BY MOUTH EVERY DAY. MAY TAKE ADDITIONAL TABLET IF NEEDED FOR SWELLING, Disp: 45 tablet, Rfl: 2 .  levalbuterol (XOPENEX HFA) 45 MCG/ACT inhaler, Inhale 2 puffs into the lungs every 4 (four) hours as needed for wheezing or shortness of breath., Disp: 15 g, Rfl: 4 .  metoprolol succinate (TOPROL-XL) 25 MG 24 hr tablet, Take 12.5 mg by mouth daily., Disp: , Rfl:  .  mometasone (NASONEX) 50 MCG/ACT nasal spray, SHAKE LIQUID AND USE 2 SPRAYS IN EACH NOSTRIL DAILY, Disp: 17 g, Rfl: 11 .  Multiple Vitamins-Minerals (PRESERVISION AREDS 2) CAPS, Take 1 capsule by mouth daily., Disp: , Rfl:  .  nitroGLYCERIN (NITROSTAT) 0.4 MG SL tablet, Place 1 tablet (0.4 mg total) under the tongue every 5 (five) minutes as needed for chest pain., Disp: 25 tablet, Rfl: 3 .  OXYGEN, Inhale 2 L continuous into the lungs. , Disp: , Rfl:  .   pantoprazole (PROTONIX) 40 MG tablet, Take 40 mg by mouth daily., Disp: , Rfl:  .  predniSONE (DELTASONE) 10 MG tablet, For flare of cough/wheezing may increase to 2 daily until better, then 1 daily, Disp: 100 tablet, Rfl: 2 .  ranitidine (ZANTAC) 150 MG tablet, Take 150 mg by mouth at bedtime. , Disp: , Rfl: 0 .  SPIRIVA RESPIMAT 2.5 MCG/ACT AERS, Inhale 2 puffs into the lungs daily., Disp: , Rfl: 11 .  spironolactone (ALDACTONE) 25 MG tablet, Take 0.5 tablets (12.5 mg total) by mouth daily., Disp: 45 tablet, Rfl: 3 .  SYMBICORT 160-4.5 MCG/ACT inhaler, INHALE 2 PUFFS INTO THE LUNGS EVERY MORNING AND ANOTHER 2 PUFFS ABOUT 12 HOURS LATER, Disp: 10.2 g, Rfl: 11  Allergies  Allergen Reactions  . Cefdinir Other (See Comments)    Severe Diarrhea  . Nitrofurantoin Swelling    Hand Swelling  . Sulfa Antibiotics Anaphylaxis and Swelling  . Sulfonamide Derivatives Swelling    REACTION: facial/tongue swelling  . Cefdinir Diarrhea    REACTION: diarrhea  . Ciprofloxacin Itching and Rash    Red itchy hands  . Nitrofurantoin Swelling    Swollen hands  . Doxycycline Other (See Comments)    Felt terrible  . Sertraline Other (See Comments)    jittery  . Ciprofloxacin Itching and Rash  . Levaquin [Levofloxacin] Itching and Rash  .  Levaquin [Levofloxacin] Itching and Rash    Objective:  Vascular Examination: Capillary refill time less than 3 seconds x 10 digits  Dorsalis pedis absent bilaterally  Posterior tibial pulses absent bilaterally  No digital hair present x 10 digits  Skin temperature gradient WNL b/l  Chronic lower extremity edema noted bilateral with no open wounds.  No erythema, no warmth, no pain with calf compression.  Dermatological Examination: Skin is thin, shiny, and atrophic bilaterally.  Toenails 1-5 b/l discolored, thick, dystrophic with subungual debris and pain with palpation to nailbeds due to thickness of nails.  Musculoskeletal: Muscle strength 5/5 to all LE  muscle groups  Neurological: Sensation intact with 10 gram monofilament.  Vibratory sensation intact.  Assessment: 1.  Painful onychomycosis toenails 1-5 b/l  2.  Peripheral arterial disease  Plan: 1. Toenails 1-5 b/l were debrided in length and girth without iatrogenic bleeding. 2. Patient to continue soft, supportive shoe gear. 3. Patient to report any pedal injuries to medical professional immediately. 4. Follow up 3 months.  5. Patient/POA to call should there be a concern in the interim.

## 2018-02-25 NOTE — Progress Notes (Signed)
Called spoke with patient, advised of lab results / recs as stated by TP.  Pt verbalized understanding and denied any questions. 

## 2018-02-25 NOTE — Telephone Encounter (Signed)
Opened in error

## 2018-03-01 ENCOUNTER — Ambulatory Visit (INDEPENDENT_AMBULATORY_CARE_PROVIDER_SITE_OTHER): Payer: Medicare Other | Admitting: Adult Health

## 2018-03-01 ENCOUNTER — Encounter: Payer: Self-pay | Admitting: Adult Health

## 2018-03-01 VITALS — BP 126/70 | Ht 69.0 in

## 2018-03-01 DIAGNOSIS — I5032 Chronic diastolic (congestive) heart failure: Secondary | ICD-10-CM

## 2018-03-01 DIAGNOSIS — J9612 Chronic respiratory failure with hypercapnia: Secondary | ICD-10-CM | POA: Diagnosis not present

## 2018-03-01 DIAGNOSIS — J449 Chronic obstructive pulmonary disease, unspecified: Secondary | ICD-10-CM | POA: Diagnosis not present

## 2018-03-01 DIAGNOSIS — J9611 Chronic respiratory failure with hypoxia: Secondary | ICD-10-CM | POA: Diagnosis not present

## 2018-03-01 LAB — BASIC METABOLIC PANEL
BUN: 22 mg/dL (ref 6–23)
CO2: 37 mEq/L — ABNORMAL HIGH (ref 19–32)
Calcium: 9.5 mg/dL (ref 8.4–10.5)
Chloride: 96 mEq/L (ref 96–112)
Creatinine, Ser: 1.15 mg/dL (ref 0.40–1.50)
GFR: 60.22 mL/min (ref >60.00)
Glucose, Bld: 112 mg/dL — ABNORMAL HIGH (ref 70–99)
Potassium: 4.1 mEq/L (ref 3.5–5.1)
Sodium: 139 mEq/L (ref 135–145)

## 2018-03-01 MED ORDER — AZITHROMYCIN 250 MG PO TABS: ORAL_TABLET | ORAL | 0 refills | Status: DC

## 2018-03-01 NOTE — Progress Notes (Signed)
Called spoke with patient, advised of lab results / recs as stated by TP.  Pt verbalized understanding and denied any questions. 

## 2018-03-01 NOTE — Assessment & Plan Note (Signed)
Appears stable , was not able to tolerate increased diuresis  Will check bmet and decrease aldactone 12.5mg 

## 2018-03-01 NOTE — Assessment & Plan Note (Signed)
Hypercarbic and hypoxic respiratory failure with very severe COPD.  Patient is continue on oxygen at current levels. Continue on use Trilogy At bedtime  And with naps and if having trouble breathing   Plan  Patient Instructions  Increase Prednisone 10mg  2 daily for 1 week and then 10mg  daily.  Zithromax  for 7 days. Take with food.  Continue on Oxygen 2l/m . Decrease Aldactone 12.5 mg daily .  Low salt diet, keep legs elevated.  Continue on Trilogy Vent .At bedtime  . May use during daytime with naps and if having trouble breathing .  Please contact office for sooner follow up if symptoms do not improve or worsen or seek emergency care   Follow up [name] or Travis Whittenberg NP in 4 weeks  and and As needed.  Please contact office for sooner follow up if symptoms do not improve or worsen or seek emergency care

## 2018-03-01 NOTE — Assessment & Plan Note (Signed)
Mild flare with sinusitis. We will treat with Zithromax x7 days.  Prednisone burst up to 20 mg for 1 week then resume 10 mg daily dosing. Continue on pulmonary hygiene regimen with flutter valve  Plan  Patient Instructions  Increase Prednisone 10mg  2 daily for 1 week and then 10mg  daily.  Zithromax  for 7 days. Take with food.  Continue on Oxygen 2l/m . Decrease Aldactone 12.5 mg daily .  Low salt diet, keep legs elevated.  Continue on Trilogy Vent .At bedtime  . May use during daytime with naps and if having trouble breathing .  Please contact office for sooner follow up if symptoms do not improve or worsen or seek emergency care   Follow up Dr. [name] or Adalai Perl NP in 4 weeks  and and As needed.  Please contact office for sooner follow up if symptoms do not improve or worsen or seek emergency care

## 2018-03-01 NOTE — Patient Instructions (Addendum)
Increase Prednisone 10mg  2 daily for 1 week and then 10mg  daily.  Zithromax  for 7 days. Take with food.  Continue on Oxygen 2l/m . Decrease Aldactone 12.5 mg daily .  Low salt diet, keep legs elevated.  Continue on Trilogy Vent .At bedtime  . May use during daytime with naps and if having trouble breathing .  Please contact office for sooner follow up if symptoms do not improve or worsen or seek emergency care   Follow up Dr. [name] or Zymier Rodgers NP in 4 weeks  and and As needed.  Please contact office for sooner follow up if symptoms do not improve or worsen or seek emergency care

## 2018-03-01 NOTE — Progress Notes (Signed)
@Patient  ID: [name], male    DOB: [birth year], 85 y.o.   MRN: [id number]  Chief Complaint  Patient presents with  . Follow-up    COPD     Referring provider: [name], MD  HPI: 85yo male former smoker (1983) followed for GOLD III COPD w/ asthma component , O2 dependent RF with act and At bedtime    TEST /Events  PFTs 05/04/05 FEV1 36% ratio 28% with 29% improvement after bronchodilators DLCO 48%  - PFT's 03/26/08 30% ratio 34 with 14% improvement after bronchodilators DLC0 38 %  - Referred to rehab 12/19/2012 >completed March 2015  - changed to spiriva respimat 02/14/2013  - started daily prednisone 06/19/13 >improved only a little>tapered off mid July 2015 >ok to change to prn prednisone 08/27/2013  - flared off nasonex and gerd rx 09/21/2014 > resumed - 10/19/2014 p extensive coaching HFA effectiveness = 90%  - changed pred to ceiling of 20 / floor of 5 mg daily as of 10/19/2014 >changed to floor of 10 mg daily 10/04/2015  - PFT's 06/08/2015 FEV1 0.58 (22 % ) ratio 27 p No sign % improvement from saba p symb/spiriva prior to study with DLCO 9/9 % corrects to 27 % for alv volume  Start noct 02 at 2lpm 2008 and on 03/04/09 desat @ >185 ft so rec wear with activtiy >rm to rm  - 2/6/2013Walked RA x one lap @ 185 stopped due to desat >corrected on 2lpm - 02/26/2014 Walked 2lpm 2 laps @ 185 ft each stopped due to Sob/ sats 88% at nl pace  - 10/10/2016Walked RA 2 laps @ 185 ft each stopped due to End of study, slow pace, sob But no desat  - HCO3 10/27/15 = 34   03/01/2018 Follow up : COPD , O2 RF , D CHF  Patient presents for a one-week follow-up.  Last visit patient was having increased symptom burden from COPD exacerbation with suspected volume overload.  Lasix was increased to 40 mg for 3 days and Aldactone was increased to 25 mg.  Patient says shortness of breath might have improved slightly.  However this has made him more weak.  Did have  some lower blood pressures when he was on the extra dose of Lasix. Lower extremity swelling is improved.  He does complain of increased sinus congestion sinus pressure and pain.  Ears are stopped up.  Feels that he has a sinus infection.  Has tried Mucinex flutter valve and saline nasal rinses without any improvement.  He denies any fever, visual changes, headache  He remains on oxygen at 2 L.  O2 saturations are 99 to 100% on 2 L.  No increased oxygen demands.  He remains on Symbicort and Spiriva.  He is on chronic steroids with prednisone 10 mg daily he is on nocturnal trilogy.  We discussed that he may use his trilogy during naps and if he has increased trouble breathing during the daytime.  Palliative care is following patient at home.   Allergies  Allergen Reactions  . Cefdinir Other (See Comments)    Severe Diarrhea  . Nitrofurantoin Swelling    Hand Swelling  . Sulfa Antibiotics Anaphylaxis and Swelling  . Sulfonamide Derivatives Swelling    REACTION: facial/tongue swelling  . Cefdinir Diarrhea    REACTION: diarrhea  . Ciprofloxacin Itching and Rash    Red itchy hands  . Nitrofurantoin Swelling    Swollen hands  . Doxycycline Other (See Comments)    Felt terrible  .  Sertraline Other (See Comments)    jittery  . Ciprofloxacin Itching and Rash  . Levaquin [Levofloxacin] Itching and Rash  . Levaquin [Levofloxacin] Itching and Rash    Immunization History  Administered Date(s) Administered  . Influenza Split 10/10/2010, 10/04/2011, 10/14/2013  . Influenza Whole 10/01/2007, 11/06/2008, 11/09/2009  . Influenza, High Dose Seasonal PF 11/07/2016, 10/18/2017  . Influenza,inj,Quad PF,6+ Mos 09/30/2012, 10/19/2014  . Influenza-Unspecified 09/07/2015  . Pneumococcal Conjugate-13 08/27/2013  . Pneumococcal Polysaccharide-23 12/11/2001, 05/20/2015    Past Medical History:  Diagnosis Date  . Arthritis    "generalized" (04/04/2017)  . Asthma   . Basal cell carcinoma     "left ear"  . BPH (benign prostatic hyperplasia)    severe; s/p multiple biopsies  . CAD (coronary artery disease)   . Carotid artery occlusion   . Chronic respiratory failure ([place])   . Chronic rhinitis   . COPD (chronic obstructive pulmonary disease) (HCC)    2L [place] O2  . Diastolic heart failure ([place]) 2019  . Dilation of biliary tract   . Elevated troponin 03/09/2017  . Gallstones   . GERD (gastroesophageal reflux disease)   . History of blood transfusion    "w/his CABG" (04/04/2017)  . History of kidney stones   . Hyperlipidemia   . Hypertension   . On home oxygen therapy    "~ 24/7" (04/04/2017)  . Peripheral vascular disease ([place])   . Pneumonia 2019  . Syncope and collapse   . Ureteral tumor 08/2015   had endoscopic procedure for evaluation, unable to reach for biopsy    Tobacco History: Social History   Tobacco Use  Smoking Status Former Smoker  . Packs/day: 2.00  . Years: 33.00  . Pack years: 66.00  . Types: Cigarettes  . Last attempt to quit: 03/25/1982  . Years since quitting: 35.9  Smokeless Tobacco Never Used   Counseling given: Not Answered   Outpatient Medications Prior to Visit  Medication Sig Dispense Refill  . ALPRAZolam (XANAX) 0.25 MG tablet TAKE 1 TABLET BY MOUTH THREE TIMES DAILY AS NEEDED 90 tablet 2  . aspirin 81 MG tablet Take 81 mg by mouth daily.    Marland Kitchen atorvastatin (LIPITOR) 20 MG tablet TAKE 1 TABLET BY MOUTH EVERY DAY 90 tablet 2  . Cholecalciferol (VITAMIN D3) 2000 units TABS Take 2,000 Units by mouth daily.    Marland Kitchen dextromethorphan-guaiFENesin (MUCINEX DM) 30-600 MG 12hr tablet Take 1-2 tablets by mouth every 12 (twelve) hours as needed for cough (w/ flutter valve).    . diclofenac sodium (VOLTAREN) 1 % GEL Apply 2 g topically 4 (four) times daily. 1 Tube 0  . finasteride (PROSCAR) 5 MG tablet Take 5 mg by mouth every morning.    . furosemide (LASIX) 20 MG tablet TAKE 1 TABLET BY MOUTH EVERY DAY. MAY TAKE ADDITIONAL TABLET IF NEEDED FOR SWELLING  45 tablet 2  . levalbuterol (XOPENEX HFA) 45 MCG/ACT inhaler Inhale 2 puffs into the lungs every 4 (four) hours as needed for wheezing or shortness of breath. 15 g 4  . metoprolol succinate (TOPROL-XL) 25 MG 24 hr tablet Take 12.5 mg by mouth daily.    . mometasone (NASONEX) 50 MCG/ACT nasal spray SHAKE LIQUID AND USE 2 SPRAYS IN EACH NOSTRIL DAILY 17 g 11  . Multiple Vitamins-Minerals (PRESERVISION AREDS 2) CAPS Take 1 capsule by mouth daily.    . nitroGLYCERIN (NITROSTAT) 0.4 MG SL tablet Place 1 tablet (0.4 mg total) under the tongue every 5 (five) minutes as needed  for chest pain. 25 tablet 3  . OXYGEN Inhale 2 L continuous into the lungs.     . pantoprazole (PROTONIX) 40 MG tablet Take 40 mg by mouth daily.    . predniSONE (DELTASONE) 10 MG tablet For flare of cough/wheezing may increase to 2 daily until better, then 1 daily 100 tablet 2  . ranitidine (ZANTAC) 150 MG tablet Take 150 mg by mouth at bedtime.   0  . SPIRIVA RESPIMAT 2.5 MCG/ACT AERS Inhale 2 puffs into the lungs daily.  11  . spironolactone (ALDACTONE) 25 MG tablet Take 0.5 tablets (12.5 mg total) by mouth daily. 45 tablet 3  . SYMBICORT 160-4.5 MCG/ACT inhaler INHALE 2 PUFFS INTO THE LUNGS EVERY MORNING AND ANOTHER 2 PUFFS ABOUT 12 HOURS LATER 10.2 g 11   No facility-administered medications prior to visit.      Review of Systems:   Constitutional:   No  weight loss, night sweats,  Fevers, chills,  +fatigue, or  lassitude.  HEENT:   No headaches,  Difficulty swallowing,  Tooth/dental problems, or  Sore throat,                No sneezing, itching, ear ache, nasal congestion, post nasal drip,   CV:  No chest pain,  Orthopnea, PND,   anasarca, dizziness, palpitations, syncope.   GI  No heartburn, indigestion, abdominal pain, nausea, vomiting, diarrhea, change in bowel habits, loss of appetite, bloody stools.   Resp:  No chest wall deformity  Skin: no rash or lesions.  GU: no dysuria, change in color of urine, no  urgency or frequency.  No flank pain, no hematuria   MS:  No joint pain or swelling.  No decreased range of motion.  No back pain.    Physical Exam  BP 126/70 (BP Location: Left Arm, Cuff Size: Normal)   Ht 5\' 9"  (1.753 m)   SpO2 100%   BMI 20.14 kg/m   GEN: A/Ox3; pleasant , NAD, frail elderly on oxygen  HEENT:  [place]/AT,  EACs-clear, TMs-wnl, NOSE-clear drainage, THROAT-clear, no lesions, no postnasal drip or exudate noted.  Maxillary sinus tenderness  NECK:  Supple w/ fair ROM; no JVD; normal carotid impulses w/o bruits; no thyromegaly or nodules palpated; no lymphadenopathy.    RESP diminished breath sounds in the bases . no accessory muscle use, no dullness to percussion  CARD:  RRR, no m/r/g, 1+ edema  peripheral edema, pulses intact, no cyanosis or clubbing.  GI:   Soft & nt; nml bowel sounds; no organomegaly or masses detected.   Musco: Warm bil, no deformities or joint swelling noted.   Neuro: alert, no focal deficits noted.    Skin: Warm, no lesions or rashes    Lab Results:  CBC    Component Value Date/Time   WBC 12.9 (H) 12/03/2017 1511   RBC 4.28 12/03/2017 1511   HGB 13.1 12/03/2017 1511   HCT 42.1 12/03/2017 1511   PLT 241 12/03/2017 1511   MCV 98.4 12/03/2017 1511   MCH 30.6 12/03/2017 1511   MCHC 31.1 12/03/2017 1511   RDW 12.5 12/03/2017 1511   LYMPHSABS 0.5 (L) 08/28/2017 1658   MONOABS 1.0 08/28/2017 1658   EOSABS 0.1 08/28/2017 1658   BASOSABS 0.1 08/28/2017 1658    BMET    Component Value Date/Time   NA 139 02/22/2018 1507   NA 141 01/04/2018 1547   K 4.0 02/22/2018 1507   CL 97 02/22/2018 1507   CO2 36 (H) 02/22/2018 1507  GLUCOSE 128 (H) 02/22/2018 1507   BUN 21 02/22/2018 1507   BUN 22 01/04/2018 1547   CREATININE 1.06 02/22/2018 1507   CALCIUM 10.4 02/22/2018 1507   GFRNONAA 56 (L) 01/04/2018 1547   GFRAA 65 01/04/2018 1547    BNP    Component Value Date/Time   BNP 104.6 (H) 12/03/2017 2029    ProBNP    Component  Value Date/Time   PROBNP 460 01/04/2018 1547   PROBNP 100.0 11/09/2017 1618    Imaging: No results found.    PFT Results Latest Ref Rng & Units 06/08/2015  FVC-Pre L 1.90  FVC-Predicted Pre % 51  FVC-Post L 2.15  FVC-Predicted Post % 58  Pre FEV1/FVC % % 28  Post FEV1/FCV % % 27  FEV1-Pre L 0.54  FEV1-Predicted Pre % 20  FEV1-Post L 0.58  DLCO UNC% % 9  DLCO COR %Predicted % 27  TLC L 6.87  TLC % Predicted % 99  RV % Predicted % 171    No results found for: NITRICOXIDE      Assessment & Plan:   COPD   GOLD IV/ 02/steroid dep  Mild flare with sinusitis. We will treat with Zithromax x7 days.  Prednisone burst up to 20 mg for 1 week then resume 10 mg daily dosing. Continue on pulmonary hygiene regimen with flutter valve  Plan  Patient Instructions  Increase Prednisone 10mg  2 daily for 1 week and then 10mg  daily.  Zithromax  for 7 days. Take with food.  Continue on Oxygen 2l/m . Decrease Aldactone 12.5 mg daily .  Low salt diet, keep legs elevated.  Continue on Trilogy Vent .At bedtime  . May use during daytime with naps and if having trouble breathing .  Please contact office for sooner follow up if symptoms do not improve or worsen or seek emergency care   Follow up Dr. [name] or Song Myre NP in 4 weeks  and and As needed.  Please contact office for sooner follow up if symptoms do not improve or worsen or seek emergency care       Chronic respiratory failure with hypoxia and hypercapnia (HCC) Hypercarbic and hypoxic respiratory failure with very severe COPD.  Patient is continue on oxygen at current levels. Continue on use Trilogy At bedtime  And with naps and if having trouble breathing   Plan  Patient Instructions  Increase Prednisone 10mg  2 daily for 1 week and then 10mg  daily.  Zithromax  for 7 days. Take with food.  Continue on Oxygen 2l/m . Decrease Aldactone 12.5 mg daily .  Low salt diet, keep legs elevated.  Continue on Trilogy Vent .At bedtime  .  May use during daytime with naps and if having trouble breathing .  Please contact office for sooner follow up if symptoms do not improve or worsen or seek emergency care   Follow up Dr. [name] or Karter Haire NP in 4 weeks  and and As needed.  Please contact office for sooner follow up if symptoms do not improve or worsen or seek emergency care       Chronic diastolic CHF (congestive heart failure) (HCC) Appears stable , was not able to tolerate increased diuresis  Will check bmet and decrease aldactone 12.5mg        Rexene Edison, NP 03/01/2018

## 2018-03-04 NOTE — Progress Notes (Signed)
Chart and office note reviewed in detail  > agree with a/p as outlined    

## 2018-03-05 ENCOUNTER — Encounter: Payer: Self-pay | Admitting: Nurse Practitioner

## 2018-03-05 ENCOUNTER — Telehealth: Payer: Self-pay | Admitting: *Deleted

## 2018-03-05 ENCOUNTER — Ambulatory Visit (INDEPENDENT_AMBULATORY_CARE_PROVIDER_SITE_OTHER): Payer: Medicare Other | Admitting: Nurse Practitioner

## 2018-03-05 VITALS — BP 160/70 | HR 99 | Ht 69.0 in | Wt 136.0 lb

## 2018-03-05 DIAGNOSIS — R0602 Shortness of breath: Secondary | ICD-10-CM | POA: Diagnosis not present

## 2018-03-05 DIAGNOSIS — I5032 Chronic diastolic (congestive) heart failure: Secondary | ICD-10-CM | POA: Diagnosis not present

## 2018-03-05 DIAGNOSIS — I2781 Cor pulmonale (chronic): Secondary | ICD-10-CM

## 2018-03-05 NOTE — Telephone Encounter (Signed)
S/w grace at pharmacy pt is requesting only brand name nitro.  Was not in stock but will be ordered and be back in stock tomorrow. Pt is aware.

## 2018-03-05 NOTE — Patient Instructions (Addendum)
We will be checking the following labs today - NONE   Medication Instructions:    Continue with your current medicines. BUT  I would like you to increase the Lasix to 2 pills for a week - then back to one a day  We will call your pharmacy about the name brand NTG   If you need a refill on your cardiac medications before your next appointment, please call your pharmacy.     Testing/Procedures To Be Arranged:  N/A  Follow-Up:   See Dr. Tamala Julian as planned next month.    At [facility], you and your health needs are our priority.  As part of our continuing mission to provide you with exceptional heart care, we have created designated Provider Care Teams.  These Care Teams include your primary Cardiologist (physician) and Advanced Practice Providers (APPs -  Physician Assistants and Nurse Practitioners) who all work together to provide you with the care you need, when you need it.  Special Instructions:  . None  Call the [place] office at [phone] if you have any questions, problems or concerns.

## 2018-03-05 NOTE — Progress Notes (Signed)
CARDIOLOGY OFFICE NOTE  Date:  03/05/2018    [name] Date of Birth: 1929/02/18 Medical Record #[id number]  PCP:  [name], MD  Cardiologist:  Tamala Julian    Chief Complaint  Patient presents with  . Fatigue    Work in visit - seen for Dr. Tamala Julian    History of Present Illness: [name] is a 85 y.o. male who presents today for a work in visit. Seen for Dr. Tamala Julian..   He has a history of CAD and edema. He had CABGin 2003, HTN, COPD on steroids w/ chronic respiratory failure,cor pulmonale on 2 L oxygen and nocturnal ventilation, HLD, PAD, history of carotid obstructive disease andRBBB.   He was admitted in November with with chest pain and weakness & loss of appetite. His troponin was negative.   Last echo 03/09/17 with EF 60-65%, G1DD, telemetry monitor in June 2019 with SR to ST with PACs and PVCs. No a fib or tachycardia, sustained.  Hx of tremor.  He does have palliative care but not hospice and he is Full Code.  Dr. [name] has asked him to stop using Trilogy at night and to use xanax for air hunger but pt has been reluctant to do so.   He also did not wish to take too much xanax and be sedated and not have input in his care.  He is aware that he does not have much time apparently.   Last seen here in December by Cecilie Kicks, NP for some edema. Remained on his oxygen.   Seen in the pulmonary office last week - more shortness of breath - some component of diastolic heart failure - diuretics were increased - this made him feel weak and have lower BP. Palliative care still following. He was felt to have some component of sinusitis and was given antibiotics along with Prednisone burst. Aldactone was cut back. He was continued on trilogy Vent (volume/pressure support with BiPap) at bedtime.   Comes in today. Here with his wife. He looks poorly. He has oxygen in place. He notes a decline over the past 10 to 12 days. His medicine list needed clarification.  His ankles are  swelling - he is sitting with his legs down most of the time. He needs to be upright to breath better. He notes his legs are weak before even getting out of bed. It is taking longer to do simple ADL's. He is wondering if his heart has gotten worse. Not having any chest pain. Has not used NTG in over 6 months - still wanting a name brand NTG called in - talks about how "Dr. Reece Levy has all that contaminated in [place]".   Past Medical History:  Diagnosis Date  . Arthritis    "generalized" (04/04/2017)  . Asthma   . Basal cell carcinoma    "left ear"  . BPH (benign prostatic hyperplasia)    severe; s/p multiple biopsies  . CAD (coronary artery disease)   . Carotid artery occlusion   . Chronic respiratory failure ([employer])   . Chronic rhinitis   . COPD (chronic obstructive pulmonary disease) ([facility])    2L [place] O2  . Diastolic heart failure ([place]) 2019  . Dilation of biliary tract   . Elevated troponin 03/09/2017  . Gallstones   . GERD (gastroesophageal reflux disease)   . History of blood transfusion    "w/his CABG" (04/04/2017)  . History of kidney stones   . Hyperlipidemia   . Hypertension   .  On home oxygen therapy    "~ 24/7" (04/04/2017)  . Peripheral vascular disease ([place])   . Pneumonia 2019  . Syncope and collapse   . Ureteral tumor 08/2015   had endoscopic procedure for evaluation, unable to reach for biopsy    Past Surgical History:  Procedure Laterality Date  . BASAL CELL CARCINOMA EXCISION Left    ear  . CARDIAC CATHETERIZATION     "prior to bypass"  . CATARACT EXTRACTION W/ INTRAOCULAR LENS  IMPLANT, BILATERAL Bilateral   . CORONARY ARTERY BYPASS GRAFT  10/2001   LIMA to LAD, SVG to OM1-2, SVG to RCA and PDA  . LITHOTRIPSY    . PROSTATE BIOPSY  Oct. 2014  . TONSILLECTOMY       Medications: Current Meds  Medication Sig  . ALPRAZolam (XANAX) 0.25 MG tablet TAKE 1 TABLET BY MOUTH THREE TIMES DAILY AS NEEDED  . aspirin 81 MG tablet Take 81 mg by mouth daily.  Marland Kitchen atorvastatin  (LIPITOR) 20 MG tablet TAKE 1 TABLET BY MOUTH EVERY DAY  . azithromycin (ZITHROMAX) 250 MG tablet 2 tabs first day then daily till gone  . cetirizine (ZYRTEC) 10 MG tablet Take 10 mg by mouth as needed for allergies.  . Cholecalciferol (VITAMIN D3) 2000 units TABS Take 2,000 Units by mouth daily.  Marland Kitchen dextromethorphan-guaiFENesin (MUCINEX DM) 30-600 MG 12hr tablet Take 1-2 tablets by mouth every 12 (twelve) hours as needed for cough (w/ flutter valve).  . diclofenac sodium (VOLTAREN) 1 % GEL Apply 2 g topically 4 (four) times daily.  . finasteride (PROSCAR) 5 MG tablet Take 5 mg by mouth every morning.  . furosemide (LASIX) 20 MG tablet TAKE 1 TABLET BY MOUTH EVERY DAY. MAY TAKE ADDITIONAL TABLET IF NEEDED FOR SWELLING  . hydrALAZINE (APRESOLINE) 25 MG tablet Take 25 mg by mouth as needed (every 6 hours).  . levalbuterol (XOPENEX HFA) 45 MCG/ACT inhaler Inhale 2 puffs into the lungs every 4 (four) hours as needed for wheezing or shortness of breath.  . metoprolol succinate (TOPROL-XL) 25 MG 24 hr tablet Take 12.5 mg by mouth daily.  . mometasone (NASONEX) 50 MCG/ACT nasal spray SHAKE LIQUID AND USE 2 SPRAYS IN EACH NOSTRIL DAILY  . Multiple Vitamins-Minerals (PRESERVISION AREDS 2) CAPS Take 1 capsule by mouth daily.  . nitroGLYCERIN (NITROSTAT) 0.4 MG SL tablet Place 1 tablet (0.4 mg total) under the tongue every 5 (five) minutes as needed for chest pain.  . OXYGEN Inhale 2 L continuous into the lungs.   . pantoprazole (PROTONIX) 40 MG tablet Take 40 mg by mouth daily.  . predniSONE (DELTASONE) 10 MG tablet For flare of cough/wheezing may increase to 2 daily until better, then 1 daily  . SPIRIVA RESPIMAT 2.5 MCG/ACT AERS Inhale 2 puffs into the lungs daily.  Marland Kitchen spironolactone (ALDACTONE) 25 MG tablet Take 0.5 tablets (12.5 mg total) by mouth daily.  . SYMBICORT 160-4.5 MCG/ACT inhaler INHALE 2 PUFFS INTO THE LUNGS EVERY MORNING AND ANOTHER 2 PUFFS ABOUT 12 HOURS LATER  . vitamin B-12  (CYANOCOBALAMIN) 100 MCG tablet Take 100 mcg by mouth daily.     Allergies: Allergies  Allergen Reactions  . Cefdinir Other (See Comments)    Severe Diarrhea  . Nitrofurantoin Swelling    Hand Swelling  . Sulfa Antibiotics Anaphylaxis and Swelling  . Sulfonamide Derivatives Swelling    REACTION: facial/tongue swelling  . Cefdinir Diarrhea    REACTION: diarrhea  . Ciprofloxacin Itching and Rash    Red itchy hands  .  Nitrofurantoin Swelling    Swollen hands  . Doxycycline Other (See Comments)    Felt terrible  . Sertraline Other (See Comments)    jittery  . Ciprofloxacin Itching and Rash  . Levaquin [Levofloxacin] Itching and Rash  . Levaquin [Levofloxacin] Itching and Rash    Social History: The patient  reports that he quit smoking about 35 years ago. His smoking use included cigarettes. He has a 66.00 pack-year smoking history. He has never used smokeless tobacco. He reports current alcohol use. He reports that he does not use drugs.   Family History: The patient's family history includes Allergies in his brother; Breast cancer in his mother; Cancer in his mother; Heart attack in his father; Heart disease in his brother, brother, father, and mother; Hyperlipidemia in his brother; Hypertension in his brother and mother; Other in his mother; Peripheral vascular disease in his brother; Stroke in his brother.   Review of Systems: Please see the history of present illness.   Otherwise, the review of systems is positive for none.   All other systems are reviewed and negative.   Physical Exam: VS:  BP (!) 160/70 (BP Location: Left Arm, Patient Position: Sitting, Cuff Size: Normal)   Pulse 99   Ht 5\' 9"  (1.753 m)   Wt 136 lb (61.7 kg)   SpO2 99% Comment: 2 liter of 02  BMI 20.08 kg/m  .  BMI Body mass index is 20.08 kg/m.  Wt Readings from Last 3 Encounters:  03/05/18 136 lb (61.7 kg)  02/22/18 136 lb 6.4 oz (61.9 kg)  01/04/18 134 lb (60.8 kg)    General: Elderly.  Looks chronically ill. Alert and in no acute distress.  Thin. Has oxygen in place.  HEENT: Normal.  Neck: Supple, no JVD, carotid bruits, or masses noted.  Cardiac: Regular rate and rhythm. +1 edema. Worse on the left.  Respiratory:  He has no air movement noted on auscultation. He has oxygen in place.  GI: Soft and nontender.  MS: No deformity or atrophy. Gait not tested.  Skin: Warm and dry. Color is sallow. Neuro:  Strength and sensation are intact and no gross focal deficits noted.  Psych: Alert, appropriate and with normal affect.   LABORATORY DATA:  EKG:  EKG is not ordered today.   Lab Results  Component Value Date   WBC 12.9 (H) 12/03/2017   HGB 13.1 12/03/2017   HCT 42.1 12/03/2017   PLT 241 12/03/2017   GLUCOSE 112 (H) 03/01/2018   CHOL 146 03/26/2017   TRIG 34 03/26/2017   HDL 79 03/26/2017   LDLCALC 60 03/26/2017   ALT 20 04/04/2017   AST 27 04/04/2017   NA 139 03/01/2018   K 4.1 03/01/2018   CL 96 03/01/2018   CREATININE 1.15 03/01/2018   BUN 22 03/01/2018   CO2 37 (H) 03/01/2018   TSH 2.212 12/03/2017   INR 1.06 04/04/2017   HGBA1C (H) 02/23/2010    5.7 (NOTE)                                                                       According to the ADA Clinical Practice Recommendations for 2011, when HbA1c is used as a screening test:   >=6.5%  Diagnostic of Diabetes Mellitus           (if abnormal result  is confirmed)  5.7-6.4%   Increased risk of developing Diabetes Mellitus  References:Diagnosis and Classification of Diabetes Mellitus,Diabetes TGYB,6389,37(DSKAJ 1):S62-S69 and Standards of Medical Care in         Diabetes - 2011,Diabetes Care,2011,34  (Suppl 1):S11-S61.     BNP (last 3 results) Recent Labs    03/24/17 2131 12/03/17 1628 12/03/17 2029  BNP 190.3* 73.3 104.6*    ProBNP (last 3 results) Recent Labs    08/28/17 1658 11/09/17 1618 01/04/18 1547  PROBNP 127.0* 100.0 460     Other Studies Reviewed Today:  Monitor Study  Highlights 06/2017  Addendum by Belva Crome, MD on Mon Jul 02, 2017 6:18 PM   Sinus rhythm and sinus tachycardia  PACs and PVCs  No atrial fibrillation or sustained tachycardia    Finalized by Belva Crome, MD on Mon Jul 02, 2017 3:20 PM   Notes recorded by Belva Crome, MD on 07/04/2017 at 12:34 PM EDT Let the patient know I discussed this with the patient. No actionable items. A copy will be sent to [name], MD   Echo 03/09/17. Study Conclusions  - Left ventricle: The cavity size was normal. Systolic function was normal. The estimated ejection fraction was in the range of 60% to 65%. Wall motion was normal; there were no regional wall motion abnormalities. Doppler parameters are consistent with abnormal left ventricular relaxation (grade 1 diastolic dysfunction). - Aortic valve: Transvalvular velocity was within the normal range. There was no stenosis. There was no regurgitation. - Mitral valve: Transvalvular velocity was within the normal range. There was no evidence for stenosis. There was trivial regurgitation. - Right ventricle: The cavity size was normal. Wall thickness was normal. Systolic function was reduced. - Tricuspid valve: There was mild regurgitation. - Pulmonary arteries: Systolic pressure was within the normal range. PA peak pressure: 24 mm Hg (S). - Pericardium, extracardiac: A trivial pericardial effusion was identified.   ASSESSMENT AND PLAN:  1.  Generalized weakness - most likely multifactorial - I suspect this is mostly due to end stage COPD. I have told him that I did not have much to offer. This current state may be his "new normal going forward" - I do not know. He is quite resistant to Hospice due to executives making 6 figures and that "they just want to kill you". His CO2 levels are rising - I suspect this is also playing a role - he endorses problems trying to concentrate, being more sleepy, etc. I  told him that I do not feel further cardiac testing is warranted. His rhythm is regular on exam. He had an echo 11 months ago. He had normal pumping function.  I did talk about resuscitation - he did not wish to have intubation but talked about using mechanical ventilation that did not require an endotracheal tube - I am not familiar with this and in the event of a resuscitation it would be an endotracheal tube that would be placed. My feeling is that he is quite aware that he is dying and has yet to accept this.   2. End stage COPD/gold status/chronic oxygen therapy  - see above.   3. CAD - managed medically - no active chest pain. Will call the pharmacy and ask for name brand NTG per his request.   4. Chronic diastolic HF - ok to increase his Lasix to 40 mg  for a week - cautioned that this may make him weaker and that overall, our options are very limited.   5. HTN - BP is elevated but not at a dangerous level.  No changes were made today. His medicine list is a little unclear.   6. Anxiety - probably driving lots of issues.    Current medicines are reviewed with the patient today.  The patient does not have concerns regarding medicines other than what has been noted above.  The following changes have been made:  See above.  Labs/ tests ordered today include:   No orders of the defined types were placed in this encounter.    Disposition:   FU with Dr. Tamala Julian as planned.    Patient is agreeable to this plan and will call if any problems develop in the interim.   SignedTruitt Merle, NP  03/05/2018 12:50 PM  [facility] [address] [place], [place]  [address] Phone: [phone] Fax: [phone]

## 2018-03-11 ENCOUNTER — Other Ambulatory Visit: Payer: Medicare Other | Admitting: *Deleted

## 2018-03-11 DIAGNOSIS — Z515 Encounter for palliative care: Secondary | ICD-10-CM

## 2018-03-15 NOTE — Progress Notes (Signed)
COMMUNITY PALLIATIVE CARE RN NOTE  PATIENT NAME: [name] DOB: 19-Apr-1929 MRN: [id number]  PRIMARY CARE PROVIDER: [name], MD  RESPONSIBLE PARTY:  Acct ID - Guarantor Home Phone Work Phone Relationship Acct Type  [id number] - Vidrio,Kyvon L [phone]  Self P/F     [place], [name], [place] [address]    PLAN OF CARE and INTERVENTION:  1. ADVANCE CARE PLANNING/GOALS OF CARE: He wants to remain at home with his wife and avoid hospitalizations. He is a Full Code 2. PATIENT/CAREGIVER EDUCATION: Reinforced Safe Mobility/Energy Conservation 3. DISEASE STATUS: Met with patient and wife in their home. Patient sitting on the couch in his living room with his legs elevated. He is alert and oriented x 4. He denies pain at this time. Continues to report worsening sinus congestion. He is now using a high volume saline by [facility] which helps for about 30 minutes. He is also using a sinus heat pack daily along with Flonase. He also c/o dizziness at times and ocular disturbance. His legs feel weaker and more tired. Edema noted to bilateral lower extremities L>R. His Lasix was recently increased to BID x 1 week to help reduce edema. He is urinating more frequently. He has been performing leg exercises daily while in a seated position to help with strengthening. He continues with dyspnea with exertion, but is usually able to recover at rest within 30 seconds. Remains on Oxygen at 2L and uses his Trilogy machine during the night. He says his Trilogy settings need to be increased, as air doesn't feel forceful enough. He advised he will contact representative to come out to evaluate settings. He is taking his Xanax 3 times/day which does help ease his breathing. His intake remains normal. He is weighing himself daily. Current wt on home scale is 128.6 lbs. He remains able to perform ADLs independently. Will continue to monitor.  HISTORY OF PRESENT ILLNESS:  This is a 85 yo male who resides at home with  his wife. Palliative Care Team continues to follow patient. Next visit scheduled in 2 weeks.  CODE STATUS: Full Code ADVANCED DIRECTIVES: Y MOST FORM: no  PPS: 50%   PHYSICAL EXAM:   VITALS: Today's Vitals   03/11/18 1540  BP: 100/69  Pulse: 77  Resp: 18  Temp: 98.2 F (36.8 C)  TempSrc: Temporal  SpO2: 100%  Weight: 128 lb 9.6 oz (58.3 kg)  Height: 5' 9"  (1.753 m)  PainSc: 0-No pain    LUNGS: decreased breath sounds CARDIAC: Cor RRR EXTREMITIES: 1+ Edema noted to bilateral lower extremities, L>R SKIN: Thin/frail skin; scattered bruising to bilateral arms/hands  NEURO: Alert and oriented x 3, generalized weakness, ambulatory   (Duration of visit and documentation 75 minutes)    [name], RN, BSN

## 2018-03-20 ENCOUNTER — Other Ambulatory Visit: Payer: Self-pay

## 2018-03-20 ENCOUNTER — Ambulatory Visit (INDEPENDENT_AMBULATORY_CARE_PROVIDER_SITE_OTHER): Payer: Medicare Other | Admitting: Interventional Cardiology

## 2018-03-20 ENCOUNTER — Encounter: Payer: Self-pay | Admitting: Interventional Cardiology

## 2018-03-20 VITALS — BP 132/64 | HR 105 | Ht 69.0 in | Wt 133.6 lb

## 2018-03-20 DIAGNOSIS — I5032 Chronic diastolic (congestive) heart failure: Secondary | ICD-10-CM | POA: Diagnosis not present

## 2018-03-20 DIAGNOSIS — I208 Other forms of angina pectoris: Secondary | ICD-10-CM

## 2018-03-20 DIAGNOSIS — J449 Chronic obstructive pulmonary disease, unspecified: Secondary | ICD-10-CM

## 2018-03-20 NOTE — Progress Notes (Signed)
Cardiology Office Note:    Date:  03/20/2018   ID:  [name], DOB 1929/11/03, MRN [id number]  PCP:  [name], MD  Cardiologist:  Sinclair Grooms, MD   Referring MD: [name], MD   Chief Complaint  Patient presents with  . Coronary Artery Disease  . Shortness of Breath    COPD    History of Present Illness:    [name] is a 85 y.o. male with a hx of CAD with prior coronary bypass grafting, COPD with cor pulmonale, hyperlipidemia, hypertension, PAD, and history of carotid obstructive disease. Chronic diastolic heart failure with EFwith EF 55 to 65% by most recent echo (HFpEF).  [name] is anxious.  States he just does not feel well.  He is worried that his heart may be giving out.  Lower extremity swelling left leg greater than right has been relatively persistent.  He sometimes feels dizzy when he stands.  He has not been having chest discomfort, palpitations, syncope, or other specific cardiac complaints.  He feels is a real struggle to breathe.  He is on 24/7 oxygen and uses a Trilogy at night to sleep.  He denies cough.  No chills or fever.   Past Medical History:  Diagnosis Date  . Arthritis    "generalized" (04/04/2017)  . Asthma   . Basal cell carcinoma    "left ear"  . BPH (benign prostatic hyperplasia)    severe; s/p multiple biopsies  . CAD (coronary artery disease)   . Carotid artery occlusion   . Chronic respiratory failure ([place])   . Chronic rhinitis   . COPD (chronic obstructive pulmonary disease) (HCC)    2L [place] O2  . Diastolic heart failure ([place]) 2019  . Dilation of biliary tract   . Elevated troponin 03/09/2017  . Gallstones   . GERD (gastroesophageal reflux disease)   . History of blood transfusion    "w/his CABG" (04/04/2017)  . History of kidney stones   . Hyperlipidemia   . Hypertension   . On home oxygen therapy    "~ 24/7" (04/04/2017)  . Peripheral vascular disease ([place])   . Pneumonia 2019  . Syncope and collapse    . Ureteral tumor 08/2015   had endoscopic procedure for evaluation, unable to reach for biopsy    Past Surgical History:  Procedure Laterality Date  . BASAL CELL CARCINOMA EXCISION Left    ear  . CARDIAC CATHETERIZATION     "prior to bypass"  . CATARACT EXTRACTION W/ INTRAOCULAR LENS  IMPLANT, BILATERAL Bilateral   . CORONARY ARTERY BYPASS GRAFT  10/2001   LIMA to LAD, SVG to OM1-2, SVG to RCA and PDA  . LITHOTRIPSY    . PROSTATE BIOPSY  Oct. 2014  . TONSILLECTOMY      Current Medications: Current Meds  Medication Sig  . ALPRAZolam (XANAX) 0.25 MG tablet TAKE 1 TABLET BY MOUTH THREE TIMES DAILY AS NEEDED  . aspirin 81 MG tablet Take 81 mg by mouth daily.  Marland Kitchen atorvastatin (LIPITOR) 20 MG tablet TAKE 1 TABLET BY MOUTH EVERY DAY  . cetirizine (ZYRTEC) 10 MG tablet Take 10 mg by mouth as needed for allergies.  . Cholecalciferol (VITAMIN D3) 2000 units TABS Take 2,000 Units by mouth daily.  . diclofenac sodium (VOLTAREN) 1 % GEL Apply 2 g topically 4 (four) times daily.  . finasteride (PROSCAR) 5 MG tablet Take 5 mg by mouth every morning.  . furosemide (LASIX) 20 MG tablet  TAKE 1 TABLET BY MOUTH EVERY DAY. MAY TAKE ADDITIONAL TABLET IF NEEDED FOR SWELLING  . hydrALAZINE (APRESOLINE) 25 MG tablet Take 25 mg by mouth as needed (every 6 hours).  . levalbuterol (XOPENEX HFA) 45 MCG/ACT inhaler Inhale 2 puffs into the lungs every 4 (four) hours as needed for wheezing or shortness of breath.  . metoprolol succinate (TOPROL-XL) 25 MG 24 hr tablet Take 12.5 mg by mouth daily.  . mometasone (NASONEX) 50 MCG/ACT nasal spray SHAKE LIQUID AND USE 2 SPRAYS IN EACH NOSTRIL DAILY  . Multiple Vitamins-Minerals (PRESERVISION AREDS 2) CAPS Take 1 capsule by mouth daily.  . nitroGLYCERIN (NITROSTAT) 0.4 MG SL tablet Place 1 tablet (0.4 mg total) under the tongue every 5 (five) minutes as needed for chest pain.  . OXYGEN Inhale 2 L continuous into the lungs.   . pantoprazole (PROTONIX) 40 MG tablet Take  40 mg by mouth daily.  . predniSONE (DELTASONE) 10 MG tablet For flare of cough/wheezing may increase to 2 daily until better, then 1 daily  . SPIRIVA RESPIMAT 2.5 MCG/ACT AERS Inhale 2 puffs into the lungs daily.  Marland Kitchen spironolactone (ALDACTONE) 25 MG tablet Take 0.5 tablets (12.5 mg total) by mouth daily.  . SYMBICORT 160-4.5 MCG/ACT inhaler INHALE 2 PUFFS INTO THE LUNGS EVERY MORNING AND ANOTHER 2 PUFFS ABOUT 12 HOURS LATER  . vitamin B-12 (CYANOCOBALAMIN) 100 MCG tablet Take 100 mcg by mouth daily.     Allergies:   Cefdinir; Nitrofurantoin; Sulfa antibiotics; Sulfonamide derivatives; Cefdinir; Ciprofloxacin; Nitrofurantoin; Doxycycline; Sertraline; Ciprofloxacin; Levaquin [levofloxacin]; and Levaquin [levofloxacin]   Social History   Socioeconomic History  . Marital status: Married    Spouse name: Not on file  . Number of children: 2  . Years of education: Not on file  . Highest education level: Not on file  Occupational History  . 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 Financial resource strain: Not on file  . Food insecurity:    Worry: Not on file    Inability: Not on file  . Transportation needs:    Medical: Not on file    Non-medical: Not on file  Tobacco Use  . Smoking status: Former Smoker    Packs/day: 2.00    Years: 33.00    Pack years: 66.00    Types: Cigarettes    Last attempt to quit: 03/25/1982    Years since quitting: 36.0  . Smokeless tobacco: Never Used  Substance and Sexual Activity  . Alcohol use: Yes    Alcohol/week: 0.0 standard drinks    Comment: 1.5-3 oz daily in the past, minimal use in the last few months  . Drug use: No  . Sexual activity: Not on file  Lifestyle  . Physical activity:    Days per week: Not on file    Minutes per session: Not on file  . Stress: Not on file  Relationships  . Social connections:    Talks on phone: Not on file    Gets together: Not on file    Attends religious  service: Not on file    Active member of club or organization: Not on file    Attends meetings of clubs or organizations: Not on file    Relationship status: Not on file  Other Topics Concern  . Not on file  Social History Narrative   ** Merged History Encounter **       Pt lives at home with his spouse.  Family History: The patient's family history includes Allergies in his brother; Breast cancer in his mother; Cancer in his mother; Heart attack in his father; Heart disease in his brother, brother, father, and mother; Hyperlipidemia in his brother; Hypertension in his brother and mother; Other in his mother; Peripheral vascular disease in his brother; Stroke in his brother.  ROS:   Please see the history of present illness.    He has leg swelling, vision disturbance, shortness of breath, depression, back pain, muscle pain, dizziness, anxiety, and excessive fatigue.  All other systems reviewed and are negative.  EKGs/Labs/Other Studies Reviewed:    The following studies were reviewed today: Home oxygen saturations rarely if ever drop below 92%.  EKG:  EKG EKG is not repeated.  EKG from recent office visit with [name] on 03/05/2018 demonstrates right bundle with right axis deviation.  Recent Labs: 04/04/2017: ALT 20 12/03/2017: B Natriuretic Peptide 104.6; Hemoglobin 13.1; Platelets 241; TSH 2.212 01/04/2018: Magnesium 2.6; NT-Pro BNP 460 03/01/2018: BUN 22; Creatinine, Ser 1.15; Potassium 4.1; Sodium 139  Recent Lipid Panel    Component Value Date/Time   CHOL 146 03/26/2017 0621   TRIG 34 03/26/2017 0621   HDL 79 03/26/2017 0621   CHOLHDL 1.8 03/26/2017 0621   VLDL 7 03/26/2017 0621   LDLCALC 60 03/26/2017 0621    Physical Exam:    VS:  BP 132/64   Pulse (!) 105   Ht 5\' 9"  (1.753 m)   Wt 133 lb 9.6 oz (60.6 kg)   SpO2 95%   BMI 19.73 kg/m     Wt Readings from Last 3 Encounters:  03/20/18 133 lb 9.6 oz (60.6 kg)  03/11/18 128 lb 9.6 oz (58.3 kg)   03/05/18 136 lb (61.7 kg)     GEN: Frail appearing with good skin color.. No acute distress HEENT: Normal NECK: No JVD. LYMPHATICS: No lymphadenopathy CARDIAC: RRR.  No murmur, no gallop, 2+ left ankle and shin edema with 1+ right shin edema VASCULAR: 2+ bilateral radial and carotid pulses, no bruits RESPIRATORY:  Clear to auscultation without rales, wheezing or rhonchi  ABDOMEN: Soft, non-tender, non-distended, No pulsatile mass, MUSCULOSKELETAL: No deformity  SKIN: Warm and dry NEUROLOGIC:  Alert and oriented x 3 PSYCHIATRIC:  Normal affect   ASSESSMENT:    1. Chronic diastolic CHF (congestive heart failure) ([place])   2. Angina at rest [facility])   3. Coronary artery disease due to lipid rich plaque   4. Chronic obstructive pulmonary disease, unspecified COPD type ([place])    PLAN:    In order of problems listed above:  1. No evidence of volume overload or contribution to dyspnea from pulmonary edema.  No change in therapy. 2. Stable without clinical angina at this time. 3. COPD is end-stage, chronic oxygen requiring.  He is in a chronic state of dyspnea related to this problem.  He is followed by Dr. [name].  No changes in cardiac therapy are recommended at this time.  He certainly is not volume overloaded.  Lower extremity edema likely related to prior bypass graft harvesting and venous insufficiency.  Continue low-dose combined diuretic therapy with furosemide and Aldactone.  PRN clinical follow-up     Medication Adjustments/Labs and Tests Ordered: Current medicines are reviewed at length with the patient today.  Concerns regarding medicines are outlined above.  No orders of the defined types were placed in this encounter.  No orders of the defined types were placed in this encounter.   Patient Instructions  Medication Instructions:  Your physician recommends that you continue on your current medications as directed. Please refer to the Current Medication list given to you  today.  If you need a refill on your cardiac medications before your next appointment, please call your pharmacy.   Lab work: None If you have labs (blood work) drawn today and your tests are completely normal, you will receive your results only by: Marland Kitchen MyChart Message (if you have MyChart) OR . A paper copy in the mail If you have any lab test that is abnormal or we need to change your treatment, we will call you to review the results.  Testing/Procedures: None  Follow-Up: At [facility], you and your health needs are our priority.  As part of our continuing mission to provide you with exceptional heart care, we have created designated Provider Care Teams.  These Care Teams include your primary Cardiologist (physician) and Advanced Practice Providers (APPs -  Physician Assistants and Nurse Practitioners) who all work together to provide you with the care you need, when you need it. You will need a follow up appointment in 6 months.  Please call our office 2 months in advance to schedule this appointment.  You may see Sinclair Grooms, MD or one of the following Advanced Practice Providers on your designated Care Team:   [name], NP Cecilie Kicks, NP . Kathyrn Drown, NP  Any Other Special Instructions Will Be Listed Below (If Applicable).       Signed, Sinclair Grooms, MD  03/20/2018 3:20 PM    [place]

## 2018-03-20 NOTE — Patient Instructions (Signed)
Medication Instructions:  Your physician recommends that you continue on your current medications as directed. Please refer to the Current Medication list given to you today.  If you need a refill on your cardiac medications before your next appointment, please call your pharmacy.   Lab work: None If you have labs (blood work) drawn today and your tests are completely normal, you will receive your results only by: . MyChart Message (if you have MyChart) OR . A paper copy in the mail If you have any lab test that is abnormal or we need to change your treatment, we will call you to review the results.  Testing/Procedures: None  Follow-Up: At [facility], you and your health needs are our priority.  As part of our continuing mission to provide you with exceptional heart care, we have created designated Provider Care Teams.  These Care Teams include your primary Cardiologist (physician) and Advanced Practice Providers (APPs -  Physician Assistants and Nurse Practitioners) who all work together to provide you with the care you need, when you need it. You will need a follow up appointment in 6 months.  Please call our office 2 months in advance to schedule this appointment.  You may see Henry W Smith III, MD or one of the following Advanced Practice Providers on your designated Care Team:   Lori Gerhardt, NP Laura Ingold, NP . Jill McDaniel, NP  Any Other Special Instructions Will Be Listed Below (If Applicable).    

## 2018-03-27 ENCOUNTER — Other Ambulatory Visit: Payer: Self-pay | Admitting: Internal Medicine

## 2018-03-31 DIAGNOSIS — I10 Essential (primary) hypertension: Secondary | ICD-10-CM | POA: Diagnosis not present

## 2018-03-31 DIAGNOSIS — J449 Chronic obstructive pulmonary disease, unspecified: Secondary | ICD-10-CM | POA: Diagnosis not present

## 2018-03-31 DIAGNOSIS — E782 Mixed hyperlipidemia: Secondary | ICD-10-CM | POA: Diagnosis not present

## 2018-03-31 DIAGNOSIS — F324 Major depressive disorder, single episode, in partial remission: Secondary | ICD-10-CM | POA: Diagnosis not present

## 2018-03-31 DIAGNOSIS — N400 Enlarged prostate without lower urinary tract symptoms: Secondary | ICD-10-CM | POA: Diagnosis not present

## 2018-04-02 ENCOUNTER — Ambulatory Visit: Payer: Medicare Other | Admitting: Adult Health

## 2018-04-15 ENCOUNTER — Telehealth: Payer: Self-pay | Admitting: Internal Medicine

## 2018-04-15 MED ORDER — PREDNISONE 10 MG PO TABS: 10.0000 mg | ORAL_TABLET | Freq: Two times a day (BID) | ORAL | 0 refills | Status: AC

## 2018-04-15 MED ORDER — AZITHROMYCIN 250 MG PO TABS: ORAL_TABLET | ORAL | 0 refills | Status: DC

## 2018-04-15 NOTE — Telephone Encounter (Signed)
Unfortunately too many allergies to expand abx so just double the dose of pred until better then reduce dose back to present one and give another Zpak

## 2018-04-15 NOTE — Telephone Encounter (Signed)
Returned phone call to patient, made aware of MW recommendations. Confirmed pharmacy. Z-pack sent. Wife requested that he try the increased dose of prednisone for a week. Prednisone 20mg  for 7 days sent to pharmacy. While on phone the patient and wife wanted to know whether they should have a pathologist evaluate his sputum. Informed patient I would let MW know.   MW please advise patient wants to know if pathologist should evaluate his sputum.

## 2018-04-15 NOTE — Telephone Encounter (Signed)
Called and spoke with pt's wife regarding MW recommendations Pt verbalized and expressed understanding; no further concerns Nothing further needed.

## 2018-04-15 NOTE — Telephone Encounter (Signed)
No need for pathologist

## 2018-04-15 NOTE — Telephone Encounter (Signed)
Primary Pulmonologist: MW Last office visit and with whom: TP on 03/01/18 What do we see them for (pulmonary problems): COPD Gold IV/Steroid Dep; Chronic diastolic CHF Last OV assessment/plan:   4. Parrett, Fonnie Mu, NP (Nurse Practitioner) at 03/01/2018 2:59 PM - Signed    Increase Prednisone 10mg  2 daily for 1 week and then 10mg  daily.  Zithromax  for 7 days. Take with food.  Continue on Oxygen 2l/m . Decrease Aldactone 12.5 mg daily .  Low salt diet, keep legs elevated.  Continue on Trilogy Vent .At bedtime  . May use during daytime with naps and if having trouble breathing .  Please contact office for sooner follow up if symptoms do not improve or worsen or seek emergency care   Follow up Dr. [name] or Parrett NP in 4 weeks  and and As needed.  Please contact office for sooner follow up if symptoms do not improve or worsen or seek emergency care      Was appointment offered to patient (explain)?  [name] wanted message routed to MW first.  Reason for call: [name] called stating that patient has been weak, tired, SOB with exertion, Dizzy and post nasal drip-brown/green for last 7 days. [name] states that pt has been home isolated during the time of the covid19. Pt has no wheezing, no chills, no cough, no fever and not been around anyone sick nor travelled in months. Pt was prescribed from PCP a Z-pack, today is the last dose. Pt is taking on inhalers and medication as recommended.   MW please advise. Thank you.

## 2018-04-17 ENCOUNTER — Telehealth: Payer: Self-pay

## 2018-04-17 NOTE — Telephone Encounter (Signed)
PA submitted for Zpack. Approved. Call made to pharmacy to make aware. Voiced understanding. Left message informing patient his medication has been approved and pharmacy is working on his order. Nothing further is needed at this time.

## 2018-04-22 ENCOUNTER — Ambulatory Visit (INDEPENDENT_AMBULATORY_CARE_PROVIDER_SITE_OTHER): Payer: Medicare Other | Admitting: Adult Health

## 2018-04-22 ENCOUNTER — Encounter: Payer: Self-pay | Admitting: Adult Health

## 2018-04-22 ENCOUNTER — Other Ambulatory Visit: Payer: Self-pay

## 2018-04-22 DIAGNOSIS — J9611 Chronic respiratory failure with hypoxia: Secondary | ICD-10-CM | POA: Diagnosis not present

## 2018-04-22 DIAGNOSIS — J310 Chronic rhinitis: Secondary | ICD-10-CM

## 2018-04-22 DIAGNOSIS — J9612 Chronic respiratory failure with hypercapnia: Secondary | ICD-10-CM | POA: Diagnosis not present

## 2018-04-22 DIAGNOSIS — J441 Chronic obstructive pulmonary disease with (acute) exacerbation: Secondary | ICD-10-CM

## 2018-04-22 NOTE — Patient Instructions (Addendum)
Continue on Symbicort and Spiriva .  Continue on Prednisone 10mg  daily.  Continue on Oxygen 2l/m . Low salt diet, keep legs elevated.  Saline nasal spray and gel As needed   May try snore strips for nasal congestion As needed   May try Flonase and Zyrtec daily As needed  Drainage.  Continue on Trilogy Vent .At bedtime  . May use during daytime with naps and if having trouble breathing .  Please contact office for sooner follow up if symptoms do not improve or worsen or seek emergency care   Follow up Dr. [name] or Shylin Keizer NP in 6-8 weeks  and and As needed.  Please contact office for sooner follow up if symptoms do not improve or worsen or seek emergency care

## 2018-04-22 NOTE — Progress Notes (Signed)
Virtual Visit via Telephone Note  I connected with [name] on 04/22/18 at  3:00 PM EDT by telephone and verified that I am speaking with the correct person using two identifiers.   I discussed the limitations, risks, security and privacy concerns of performing an evaluation and management service by telephone and the availability of in person appointments. I also discussed with the patient that there may be a patient responsible charge related to this service. The patient expressed understanding and agreed to proceed.   History of Present Illness: 85 year old male former smoker (1983) followed for gold 3 COPD with asthma component, oxygen dependent respiratory failure (uses oxygen with activity and at bedtime) along with Trilogy vent At bedtime  . Diastolic Dysfunction   Medical history significant for coronary artery disease   Today's tele-visit is a follow-up for COPD Patient was present for today's visit at home, myself was present for today's visit at office  Patient has underlying severe COPD.  With an asthmatic component.  He is maintained on He has oxygen dependent respiratory failure.  Remains on oxygen 2 L with activity and at bedtime Patient is maintained on Symbicort and Spiriva.  Patient called into the office last week with increased cough and shortness of breath.  He was given a Z-Pak and a prednisone taper.  Patient is feeling better . Feels this has helped. Has 1 days left of Zpack and Prednisone 20mg  . Will return to daily prednisone 10mg  daily tomorrow.  No flare of cough or wheezing . No discolored mucus.     Observations/Objective:  PFTs 05/04/05 FEV1 36% ratio 28% with 29% improvement after bronchodilators DLCO 48%  - PFT's 03/26/08 30% ratio 34 with 14% improvement after bronchodilators DLC0 38 %  - Referred to rehab 12/19/2012 >completed March 2015  - changed to spiriva respimat 02/14/2013  - started daily prednisone 06/19/13 >improved only a little>tapered off  mid July 2015 >ok to change to prn prednisone 08/27/2013  - flared off nasonex and gerd rx 09/21/2014 > resumed - 10/19/2014 p extensive coaching HFA effectiveness = 90%  - changed pred to ceiling of 20 / floor of 5 mg daily as of 10/19/2014 >changed to floor of 10 mg daily 10/04/2015  - PFT's 06/08/2015 FEV1 0.58 (22 % ) ratio 27 p No sign % improvement from saba p symb/spiriva prior to study with DLCO 9/9 % corrects to 27 % for alv volume  Start noct 02 at 2lpm 2008 and on 03/04/09 desat @ >185 ft so rec wear with activtiy >rm to rm  - 2/6/2013Walked RA x one lap @ 185 stopped due to desat >corrected on 2lpm - 02/26/2014 Walked 2lpm 2 laps @ 185 ft each stopped due to Sob/ sats 88% at nl pace  - 10/10/2016Walked RA 2 laps @ 185 ft each stopped due to End of study, slow pace, sob But no desat  - HCO3 10/27/15 = 34 Chest x-ray November 2019 showed COPD changes otherwise without acute process Assessment and Plan: COPD exacerbation-seems to be improving on current regimen.  Patient to finish up his azithromycin and prednisone today as recommended.  He will resume daily chronic prednisone at 10 mg tomorrow.  Chronic hypercarbic and hypoxic respiratory failure.  Patient continue on oxygen 2 L.  Goal to keep O2 saturations greater than 88 to 90%.  Patient is continue on trilogy vent at bedtime.  He also may use this during the daytime with naps and if he is having trouble breathing.  Chronic  rhinitis patient may use saline nasal spray and gel as needed.  Continue on Flonase and Zyrtec as needed.  Patient may also try snore strips as needed to help with nasal congestion.  Plan  Patient Instructions  Continue on Symbicort and Spiriva .  Continue on Prednisone 10mg  daily.  Continue on Oxygen 2l/m . Low salt diet, keep legs elevated.  Saline nasal spray and gel As needed   May try snore strips for nasal congestion As needed   May try Flonase and Zyrtec daily As needed   Drainage.  Continue on Trilogy Vent .At bedtime  . May use during daytime with naps and if having trouble breathing .  Please contact office for sooner follow up if symptoms do not improve or worsen or seek emergency care   Follow up Dr. Melvyn [name] or Travis Cloward NP in 6-8 weeks  and and As needed.  Please contact office for sooner follow up if symptoms do not improve or worsen or seek emergency care      Follow Up Instructions: Follow up with Dr. Melvyn [name]  In 6 weeks and As needed   Please contact office for sooner follow up if symptoms do not improve or worsen or seek emergency care     I discussed the assessment and treatment plan with the patient. The patient was provided an opportunity to ask questions and all were answered. The patient agreed with the plan and demonstrated an understanding of the instructions.   The patient was advised to call back or seek an in-person evaluation if the symptoms worsen or if the condition fails to improve as anticipated.  I provided 26  minutes of non-face-to-face time during this encounter.   Rexene Edison, NP

## 2018-04-24 DIAGNOSIS — R350 Frequency of micturition: Secondary | ICD-10-CM | POA: Diagnosis not present

## 2018-04-24 DIAGNOSIS — C669 Malignant neoplasm of unspecified ureter: Secondary | ICD-10-CM | POA: Diagnosis not present

## 2018-04-24 DIAGNOSIS — N401 Enlarged prostate with lower urinary tract symptoms: Secondary | ICD-10-CM | POA: Diagnosis not present

## 2018-04-29 ENCOUNTER — Other Ambulatory Visit: Payer: Medicare Other | Admitting: *Deleted

## 2018-04-29 ENCOUNTER — Other Ambulatory Visit: Payer: Self-pay

## 2018-04-29 ENCOUNTER — Other Ambulatory Visit: Payer: Self-pay | Admitting: Internal Medicine

## 2018-04-29 DIAGNOSIS — Z515 Encounter for palliative care: Secondary | ICD-10-CM

## 2018-05-07 NOTE — Progress Notes (Signed)
COMMUNITY PALLIATIVE CARE RN NOTE  PATIENT NAME: [name] DOB: Nov 17, 1929 MRN: [id number]  PRIMARY CARE PROVIDER: [name], MD  RESPONSIBLE PARTY:  Acct ID - Guarantor Home Phone Work Phone Relationship Acct Type  [id number] - Soledad,Jayleon [name] [phone]  Self P/F     [address], [name], [place] [address]   Due to the COVID-19 crisis, this virtual check-in visit was done via telephone from my office and it was initiated and consent by this patient and or family.  PLAN OF CARE and INTERVENTION:  1. ADVANCE CARE PLANNING/GOALS OF CARE: Goal is for patient to remain at home with his wife. He is a Full Code 2. PATIENT/CAREGIVER EDUCATION: Reinforced Pain Management and Reviewed use of inhalers vs nebulizers 3. DISEASE STATUS: Virtual check in visit completed with patient today. He reports that he has been experiencing increased sinus and head congestion. He states that none of his current medications seem to work. He has been utilizing Afrin for the past 2-3 days, with little relief. He also has tried Nasonex, sinus heat packs, Nasol and saline nasal rinses. They will be useful for about 20 minutes at most, then congestion returns. He has completed 2 rounds of a Z-pak for suspected sinus infections since my last visit. Over the past few days he reports increased fatigue and difficulty getting dressed (taking longer due to more frequent rest periods). He remains on Oxygen at 2L via [place] and using Trilogy machine during the night. He is using his rescue inhaler about 3x/day with little effect. Recommended that during times of increased dyspnea, that nebulizer machine may be more effective during those times. He has increased taking his Xanax (1/2 of 0.25 mg tablet) from 3x/day to 5x/day which he says has helped, especially after dinner. His intake is normal, and his weight has been stable at 128 lbs. He has had some issues with dizziness upon standing. Continue to educate about changing positions  slowly. He states his ankles are slightly more swollen, and he tries to elevate them as much as possible. He remains able to perform ADLs independently and is ambulatory without assistive devices. His daughter has been helping him and his wife with household chores and grocery shopping. Will continue to monitor.  HISTORY OF PRESENT ILLNESS:  This is a 85 yo male who resides at home with his wife. Palliative Care Team continues to follow patient . Will continue to check in monthly and PRN.  CODE STATUS: Full Code ADVANCED DIRECTIVES: Y MOST FORM: no PPS: 50%   (Duration of visit and documentation 45 minutes)   [name], RN BSN

## 2018-05-08 ENCOUNTER — Telehealth: Payer: Self-pay | Admitting: Internal Medicine

## 2018-05-08 MED ORDER — LEVALBUTEROL TARTRATE 45 MCG/ACT IN AERO: 2.0000 | INHALATION_SPRAY | RESPIRATORY_TRACT | 4 refills | Status: DC | PRN

## 2018-05-08 NOTE — Telephone Encounter (Signed)
Returned call to [name] and informed rx Xopenex HFA has been sent. Nothing further needed.

## 2018-05-08 NOTE — Telephone Encounter (Signed)
Patients wife [name]  calling about refill for inhaler.  Patient phone number is [phone].

## 2018-05-10 ENCOUNTER — Other Ambulatory Visit: Payer: Self-pay | Admitting: *Deleted

## 2018-05-10 MED ORDER — LEVALBUTEROL TARTRATE 45 MCG/ACT IN AERO: 2.0000 | INHALATION_SPRAY | RESPIRATORY_TRACT | 3 refills | Status: DC | PRN

## 2018-05-10 MED ORDER — LEVALBUTEROL HCL 0.63 MG/3ML IN NEBU: INHALATION_SOLUTION | RESPIRATORY_TRACT | 3 refills | Status: DC

## 2018-05-15 DIAGNOSIS — J019 Acute sinusitis, unspecified: Secondary | ICD-10-CM | POA: Diagnosis not present

## 2018-05-15 DIAGNOSIS — J3089 Other allergic rhinitis: Secondary | ICD-10-CM | POA: Diagnosis not present

## 2018-05-15 DIAGNOSIS — J449 Chronic obstructive pulmonary disease, unspecified: Secondary | ICD-10-CM | POA: Diagnosis not present

## 2018-05-15 DIAGNOSIS — K219 Gastro-esophageal reflux disease without esophagitis: Secondary | ICD-10-CM | POA: Diagnosis not present

## 2018-05-16 ENCOUNTER — Telehealth: Payer: Self-pay | Admitting: Internal Medicine

## 2018-05-16 NOTE — Telephone Encounter (Signed)
Pt was prescribed Prednisone taper 60.50.40.30.20.10 by [facility] along with Augmentin for sinus infection. He feels it is a rather large dose of prednisone. He wants to know if MW thinks its safe?

## 2018-05-16 NOTE — Telephone Encounter (Signed)
Since he is already taking a daily dose it is probably okay but we always try to  use the lowest dose that works.  If 20 mg previously worked as a ceiling and there is no reason to exceeded and he can continue to take the prednisone the way I recommended.  If that 20 mg dose does not help then  it would make sense to double it to 40 and taper from there.

## 2018-05-16 NOTE — Telephone Encounter (Signed)
Routing to [facility] triage.

## 2018-05-16 NOTE — Telephone Encounter (Signed)
Advised per Dr. Gustavus Bryant directions in previous message. 40 mgs until feeling symptoms improving then down by 10. No set formula "ceiling and a floor". Nothing further needed.

## 2018-05-16 NOTE — Telephone Encounter (Signed)
Called and spoke with pt letting him know the info stated by MW and pt stated he would be fine starting at 40mg  vs the 60mg  which [facility] had originally stated for him to do.  Pt is wanting to know from MW how long he should do the 40mg  prior to tapering down to 20mg  as he said originally [facility] had told him to do 60mg  for one day and then the next day go down to 50mg  and so on.    Dr. [name], please advise on this for pt to help with his confusion how long he should do the 40mg  prior to going down to 20mg . Thanks!

## 2018-05-16 NOTE — Telephone Encounter (Signed)
The principal is exactly the same.  He takes the higher dose until he feels that his symptoms have improved and then gradually  reduces the dose down to the floor of 10 mg daily -there is no set formula just a ceiling and a floor as we previously discussed many times in the office.

## 2018-05-17 ENCOUNTER — Other Ambulatory Visit: Payer: Self-pay

## 2018-05-17 ENCOUNTER — Other Ambulatory Visit: Payer: Medicare Other | Admitting: *Deleted

## 2018-05-17 DIAGNOSIS — Z515 Encounter for palliative care: Secondary | ICD-10-CM

## 2018-05-22 ENCOUNTER — Telehealth: Payer: Self-pay

## 2018-05-22 NOTE — Telephone Encounter (Signed)
Pt returned call. RN not available. Please call as soon as available to the home number.

## 2018-05-22 NOTE — Telephone Encounter (Signed)
I returned the pt's call. Patient states around a year ago he had a seizure and no reason was found as to why.  Patient states he feels disconnected from reality  and dizzy.   Patient states he still is struggling with COPD and severe sinusitis.   Patient states blurred vision/fatiuge/trouble focusing has been a problem for him as well. Pateint requested this information be fwd as a FYI for tomorrow virtual video visit.

## 2018-05-22 NOTE — Telephone Encounter (Signed)
I contacted the pt's wife [name] ok per dpr)  She understanding that although there may be some limitations with this type of visit, we will take all precautions to reduce any security or privacy concerns. She understands that this will be treated like an in office visit and we will file with pt's insurance, and there may be a patient responsible charge related to this service.  I updated the patients allergies, PMH and meds.   Wife reports patient has trouble with extreme sinusitis and COPD.

## 2018-05-23 ENCOUNTER — Telehealth: Payer: Self-pay | Admitting: Neurology

## 2018-05-23 ENCOUNTER — Ambulatory Visit (INDEPENDENT_AMBULATORY_CARE_PROVIDER_SITE_OTHER): Payer: Medicare Other | Admitting: Neurology

## 2018-05-23 ENCOUNTER — Encounter: Payer: Self-pay | Admitting: Neurology

## 2018-05-23 ENCOUNTER — Ambulatory Visit: Payer: Medicare Other | Admitting: Podiatry

## 2018-05-23 ENCOUNTER — Other Ambulatory Visit: Payer: Self-pay

## 2018-05-23 DIAGNOSIS — G44209 Tension-type headache, unspecified, not intractable: Secondary | ICD-10-CM

## 2018-05-23 DIAGNOSIS — G255 Other chorea: Secondary | ICD-10-CM

## 2018-05-23 DIAGNOSIS — I208 Other forms of angina pectoris: Secondary | ICD-10-CM | POA: Diagnosis not present

## 2018-05-23 MED ORDER — GABAPENTIN 100 MG PO CAPS: ORAL_CAPSULE | ORAL | 2 refills | Status: DC

## 2018-05-23 NOTE — Progress Notes (Signed)
COMMUNITY PALLIATIVE CARE RN NOTE  PATIENT NAME: [name] DOB: 17-Feb-1929 MRN: [id number]  PRIMARY CARE PROVIDER: [name], MD  RESPONSIBLE PARTY: [name] (Wife) Acct ID - Guarantor Home Phone Work Phone Relationship Acct Type  [id number] - [place] [name] [phone]  Self P/F     [place], [name], [place] [address]   Due to the COVID-19 crisis, this virtual check-in visit was done via telephone from my office and it was initiated and consent by this patient and or family.  PLAN OF CARE and INTERVENTION:  1. ADVANCE CARE PLANNING/GOALS OF CARE: Goal is for patient to remain at home with his wife. He is a Full Code. 2. PATIENT/CAREGIVER EDUCATION: Anxiety Management, Breathing Techniques 3. DISEASE STATUS: Virtual check-in visit completed with patient via telephone. Patient c/o extreme nasal congestion over the past few days, which has seemed to worsen despite the use of his usual regimen. He was placed on Amoxiclav along with a Prednisone taper 2 days ago. He states that he is noticing that today his gait is very unsteady and he can hardly walk without significant fatigue. He reports his face being flushed and "rosy" cheeks which he noticed this am. He says he is groggy and sleepy with difficulty staying awake at times. His HR is also somewhat elevated at 103. HR is usually in the 80s range. Patient unsure of what is causing this. Instructed patient to contact MD office who prescribed antibiotics and taper to see what they recommend that he do. Patient agreed and advised will contact MD today. He has an appointment with a Neurologist next week. Will continue to monitor.   HISTORY OF PRESENT ILLNESS:  This is a 85 yo male who resides at home with his wife. Palliative Care Team continues to follow patient. Will continue to check in with patient monthly and PRN.  CODE STATUS: Full Code ADVANCED DIRECTIVES: Y MOST FORM: no PPS: 50%   (Duration of visit and documentation  30 minutes)   Travis Eastern, RN BSN

## 2018-05-23 NOTE — Progress Notes (Signed)
Virtual Visit via Video Note  I connected with [name] on 05/23/18 at 10:30 AM EDT by a video enabled telemedicine application and verified that I am speaking with the correct person using two identifiers.  Location: Patient: The patient is at home. Provider: Physician in office.   I discussed the limitations of evaluation and management by telemedicine and the availability of in person appointments. The patient expressed understanding and agreed to proceed.  History of Present Illness: [name] is an 85 year old right-handed white male with a history of COPD with significant pulmonary issues, on oxygen.  The patient has had an admission to the hospital on 04 April 2017 when he was unable to walk.  He was evaluated for cerebrovascular disease, MRI of the brain at that time was unremarkable, he does have generalized cortical atrophy.  The patient was seen by neurology and was felt to have asterixis related to a metabolic problem, the patient apparent was told that this was due to a medication, Ranexa.  The patient resolved the asterixis within about a half a day.  An EEG study at that time was unremarkable.  The patient claims that since that time he has had "tremors" in both hands, left greater than right.  This has gradually worsened as time has gone on.  The tremors do not affect his handwriting or his ability to feed himself.  He occasionally may have myoclonus, he does not describe any recurring episodes of asterixis since being in the hospital.  He notes that if he holds his hands out and spreads out the fingers, the fingers will seem to move and wiggle on their own.  In general, he feels weak all over, he does note some slight numbness and burning in the feet at times and occasionally in the fingertips.  He is able to ambulate fairly well without assistance, he does not use a cane or a walker.  Sometimes, he will feel dizzy when he stands up too quickly.  He claims that his memory is  good and he does suffer from generalized fatigue.  He sleeps well at night.  The patient comes to this office for an evaluation.  The patient reports a chronic issue with a sensation about the head as if he is wearing a hat that is too tight.  He finds the sensation is uncomfortable for him, he claims that he had a sinus work-up that was unremarkable.   Observations/Objective: The video evaluation reveals that the patient is alert and cooperative.  The patient has a normal speech pattern, no aphasia or dysarthria is noted.  He has full extraocular movements.  Face is symmetric, he is able to protrude the tongue in the midline with good lateral movement of the tongue.  He has good finger-nose-finger and heel shin bilaterally.  With arms outstretched, no evidence of asterixis was seen.  When he holds his hand up and spread to the fingers, there are what appears to be mild choreoathetotic movements of the fingers bilaterally, left greater than right.  The patient is able to ambulate without assistance.  Tandem gait was not attempted.  Romberg is negative, no drift is seen.  Assessment and Plan: 1.  Muscle tension headache  2.  History of asterixis and myoclonus  3.  Choreoathetotic movements of the fingers  The patient appears to have mild choreoathetosis of the fingers of the hands, left greater than right.  Prior MRI of the brain was unremarkable.  The movements of  the fingers are a sensation that the patient has but it does not affect his ability to use his hands.  He is more bothered by the muscle tension headache.  The patient be placed on very low-dose gabapentin taking 100 mg at night for a week and then go to 100 mg twice daily.  He is to watch out for any worsening myoclonus or asterixis on this medication.  He will follow-up in about 3 months.  He will call for any dose adjustments of the medication.  Follow Up Instructions: 1-month follow-up with me.   I discussed the assessment and  treatment plan with the patient. The patient was provided an opportunity to ask questions and all were answered. The patient agreed with the plan and demonstrated an understanding of the instructions.   The patient was advised to call back or seek an in-person evaluation if the symptoms worsen or if the condition fails to improve as anticipated.  I provided 35 minutes of non-face-to-face time during this encounter.   Kathrynn Ducking, MD

## 2018-05-23 NOTE — Telephone Encounter (Signed)
LVM to schedule 3 month follow-up with Dr. Jannifer Franklin. Dr. Jannifer Franklin requests that this follow-up be a virtual visit as well.

## 2018-05-24 ENCOUNTER — Other Ambulatory Visit: Payer: Medicare Other | Admitting: Licensed Clinical Social Worker

## 2018-05-24 ENCOUNTER — Other Ambulatory Visit: Payer: Self-pay

## 2018-05-24 ENCOUNTER — Other Ambulatory Visit: Payer: Self-pay | Admitting: Internal Medicine

## 2018-05-24 DIAGNOSIS — Z515 Encounter for palliative care: Secondary | ICD-10-CM

## 2018-05-27 ENCOUNTER — Other Ambulatory Visit: Payer: Self-pay | Admitting: Internal Medicine

## 2018-05-27 MED ORDER — ALPRAZOLAM 0.25 MG PO TABS: ORAL_TABLET | ORAL | 3 refills | Status: DC

## 2018-05-27 NOTE — Progress Notes (Signed)
COMMUNITY PALLIATIVE CARE SW NOTE  PATIENT NAME: [name] DOB: 1929-02-01 MRN: [id number]  PRIMARY CARE PROVIDER: [name], MD  RESPONSIBLE PARTY:  Acct ID - Guarantor Home Phone Work Phone Relationship Acct Type  [id number] - [name],[name] [name] [phone]  Self P/F     [name], [name], [place] [address]   Due to the COVID-19 crisis, this virtual check-in visit was done via telephone from my office and it was initiated and consent given by this patient and or family.  PLAN OF CARE and INTERVENTIONS:             1. GOALS OF CARE/ ADVANCE CARE PLANNING:  Patient's goal is to remain at home with his wife, [name].  He is a full code. 2. SOCIAL/EMOTIONAL/SPIRITUAL ASSESSMENT/ INTERVENTIONS:  SW conducted a virtual check-in visit with patient's wife.  She reported patient has had a headache due to congestion.  She said the doctors wanted patient under Hospice, but that he would never agree to it.  SW provided active listening and supportive counseling while she discussed caregiver stress. 3. PATIENT/CAREGIVER EDUCATION/ COPING:  [name] copes by expressing her feelings openly. 4. PERSONAL EMERGENCY PLAN:  Patient will rest when he becomes fatigued.  [name] will contact patient's doctors for advice as needed. 5. COMMUNITY RESOURCES COORDINATION/ HEALTH CARE NAVIGATION:  None. 6. FINANCIAL/LEGAL CONCERNS/INTERVENTIONS:  None.     SOCIAL HX:  Social History   Tobacco Use  . Smoking status: Former Smoker    Packs/day: 2.00    Years: 33.00    Pack years: 66.00    Types: Cigarettes    Last attempt to quit: 03/25/1982    Years since quitting: 36.1  . Smokeless tobacco: Never Used  Substance Use Topics  . Alcohol use: Yes    Alcohol/week: 0.0 standard drinks    Comment: 1.5-3 oz daily in the past, minimal use in the last few months    CODE STATUS:  Full Code ADVANCED DIRECTIVES: N MOST FORM COMPLETE:  N HOSPICE EDUCATION PROVIDED:  Provided education regarding DME per the  request of patient's wife. PPS:  Patient's appetite is normal.  He ambulates independently. Duration of visit and documentation:  45 minutes.      Creola Corn Danyella Mcginty, LCSW

## 2018-05-29 ENCOUNTER — Telehealth: Payer: Self-pay | Admitting: Neurology

## 2018-05-29 NOTE — Telephone Encounter (Signed)
I called the patient.  The patient took his first gabapentin dose last evening, had high blood pressure this morning, I am not clear that the gabapentin was the etiology of this, gabapentin usually does not elevate blood pressure.  He will hold off on taking the medication for several days and when the blood pressure is stable, he will re-start the drug.

## 2018-05-29 NOTE — Telephone Encounter (Signed)
Pt states he is concerned that by taking this new medication gabapentin (NEURONTIN) 100 MG capsule it is the cause of the spike of his blood pressure.  Pt stated he is normally 132/50. His reading this morning was 149/117.  Pt stated he waited a while and checked later this morning and it was still out of range at 165/77.  Pt states he wishes to hold off on the medication tonight.  Please call to discuss

## 2018-05-30 ENCOUNTER — Other Ambulatory Visit: Payer: Self-pay | Admitting: Interventional Cardiology

## 2018-05-31 DIAGNOSIS — Z0000 Encounter for general adult medical examination without abnormal findings: Secondary | ICD-10-CM | POA: Diagnosis not present

## 2018-05-31 DIAGNOSIS — E559 Vitamin D deficiency, unspecified: Secondary | ICD-10-CM | POA: Diagnosis not present

## 2018-05-31 DIAGNOSIS — I509 Heart failure, unspecified: Secondary | ICD-10-CM | POA: Diagnosis not present

## 2018-05-31 DIAGNOSIS — F324 Major depressive disorder, single episode, in partial remission: Secondary | ICD-10-CM | POA: Diagnosis not present

## 2018-05-31 DIAGNOSIS — I10 Essential (primary) hypertension: Secondary | ICD-10-CM | POA: Diagnosis not present

## 2018-05-31 DIAGNOSIS — R5383 Other fatigue: Secondary | ICD-10-CM | POA: Diagnosis not present

## 2018-05-31 DIAGNOSIS — J449 Chronic obstructive pulmonary disease, unspecified: Secondary | ICD-10-CM | POA: Diagnosis not present

## 2018-05-31 DIAGNOSIS — E782 Mixed hyperlipidemia: Secondary | ICD-10-CM | POA: Diagnosis not present

## 2018-06-02 ENCOUNTER — Other Ambulatory Visit: Payer: Self-pay | Admitting: Internal Medicine

## 2018-06-13 ENCOUNTER — Other Ambulatory Visit: Payer: Medicare Other | Admitting: *Deleted

## 2018-06-13 ENCOUNTER — Other Ambulatory Visit: Payer: Self-pay

## 2018-06-13 DIAGNOSIS — Z515 Encounter for palliative care: Secondary | ICD-10-CM

## 2018-06-14 DIAGNOSIS — J449 Chronic obstructive pulmonary disease, unspecified: Secondary | ICD-10-CM | POA: Diagnosis not present

## 2018-06-14 DIAGNOSIS — E559 Vitamin D deficiency, unspecified: Secondary | ICD-10-CM | POA: Diagnosis not present

## 2018-06-14 DIAGNOSIS — E782 Mixed hyperlipidemia: Secondary | ICD-10-CM | POA: Diagnosis not present

## 2018-06-14 DIAGNOSIS — R5383 Other fatigue: Secondary | ICD-10-CM | POA: Diagnosis not present

## 2018-06-14 DIAGNOSIS — I509 Heart failure, unspecified: Secondary | ICD-10-CM | POA: Diagnosis not present

## 2018-06-14 DIAGNOSIS — F324 Major depressive disorder, single episode, in partial remission: Secondary | ICD-10-CM | POA: Diagnosis not present

## 2018-06-14 DIAGNOSIS — Z0000 Encounter for general adult medical examination without abnormal findings: Secondary | ICD-10-CM | POA: Diagnosis not present

## 2018-06-14 DIAGNOSIS — I10 Essential (primary) hypertension: Secondary | ICD-10-CM | POA: Diagnosis not present

## 2018-06-17 ENCOUNTER — Ambulatory Visit: Payer: Medicare Other | Admitting: Podiatry

## 2018-06-21 NOTE — Progress Notes (Signed)
COMMUNITY PALLIATIVE CARE RN NOTE  PATIENT NAME: [name]  DOB: 1929/11/19 MRN: [id number]  PRIMARY CARE PROVIDER: [name], MD  RESPONSIBLE PARTY: Diamonte Stavely (wife) Acct ID - Guarantor Home Phone Work Phone Relationship Acct Type  [id number] - Sorrel [name] [phone]  Self P/F     [place], [name], [place] [address]   Due to the COVID-19 crisis, this virtual check-in visit was done via telephone from my office and it was initiated and consent by this patient and or family.  PLAN OF CARE and INTERVENTION:  1. ADVANCE CARE PLANNING/GOALS OF CARE: Goal is for patient to remain at home with his wife. He is a Full Code. 2. PATIENT/CAREGIVER EDUCATION: Pain Management, Energy Conservation and Edema Management 3. DISEASE STATUS: Virtual check-in visit completed with patient today. Patient reports that he is experiencing pain in his neck and shoulders when he wakes up. Also c/o constant sinus pressure and does not feel like current medications are working. Also reports new issues with his legs cramping. He has been taking Aspirin for this. I asked if patient has had any recent lab work since he is on an increased dose of Lasix and now experiencing leg cramps. He stated that he was supposed to call his MD to have labs drawn but forgot and says that he will call to schedule a time to have this done. He was placed on a low dose of gabapentin last month by his Neurologist, however patient states that he tried this for about 2-3 days and stopped as he thinks it is making him feel more drowsy. He feels that his breathing is worse, along with a low energy level and feeling exhausted all of the time. His O2 sats are usually in the mid to upper 90s. He remains on O2 at 2L/min via [place] continuously and uses his Trilogy machine throughout the night. He also says he is having more swelling in both lower extremities and is taking an additional dose of Lasix. He says that his BP drops low at times.  Also some dizziness with standing. Continue to reinforce changing positions slowly. Also recommend that he uses his nebulizers vs inhalers during times of increased dyspnea. He continues with Xanax, which he feels does help some with anxiety and breathing. He remains ambulatory and able to perform ADLs independently. Requires frequent rest periods. He does try to perform exercises given to him by PT, but not as regularly as in previous months. Will continue to monitor.  HISTORY OF PRESENT ILLNESS:  This is a 85 yo male who resides at home with his wife. Palliative Care Team continues to follow patient. Will continue to check in with patient monthly and PRN.  CODE STATUS: Full Code  ADVANCED DIRECTIVES: Y MOST FORM: no PPS: 50%  (Duration of visit and documentation 45 minutes)   [name], RN BSN

## 2018-07-02 DIAGNOSIS — J449 Chronic obstructive pulmonary disease, unspecified: Secondary | ICD-10-CM | POA: Diagnosis not present

## 2018-07-02 DIAGNOSIS — R609 Edema, unspecified: Secondary | ICD-10-CM | POA: Diagnosis not present

## 2018-07-02 DIAGNOSIS — R0600 Dyspnea, unspecified: Secondary | ICD-10-CM | POA: Diagnosis not present

## 2018-07-02 DIAGNOSIS — I509 Heart failure, unspecified: Secondary | ICD-10-CM | POA: Diagnosis not present

## 2018-07-04 ENCOUNTER — Telehealth: Payer: Self-pay | Admitting: Interventional Cardiology

## 2018-07-04 NOTE — Telephone Encounter (Signed)
Patient returned your call, please call him at [phone].

## 2018-07-04 NOTE — Telephone Encounter (Signed)
Left message to call back  

## 2018-07-04 NOTE — Telephone Encounter (Signed)
Pt states that he noticed today that his heart seems to be "skipping" more.  Normal for 20 beats, then skip, normal 5 beats, then skip.  BP this morning was 92/52.  Once he got up moving around, he slowly came up to 143/67.  HR usually 80-84, today was 90.  Currently takes Metoprolol Succ 12.5mg  once daily.  Pt also mentioned that he has worsening swelling, left greater than right. Prescribed Furosemide 20QD and Spironolactone 12.5mg  QD.  Took an extra Furosemide today and states he has been running to the restroom a lot.  Weight consistently between 128-129lbs.  Was 129lbs today.  SOB seems a little worse than usual.  Advised I would send to Dr. Tamala Julian for review.

## 2018-07-04 NOTE — Telephone Encounter (Signed)
New Message           Patient's wife is calling today because  her husband B/P is 140/67 and the pulse is running 90, also the pulse is skipping beats. Per wife the patient does not feel  Well. Patient is having sob as well.

## 2018-07-04 NOTE — Telephone Encounter (Signed)
Spoke with pt and went over recommendations.  Pt verbalized understanding and was appreciative for call.  

## 2018-07-04 NOTE — Telephone Encounter (Signed)
He should be careful with taking extra furosemide.  This could cause his potassium to be low and in turn because skipping heartbeat.  I have looked at all the numbers and see no alarming values.  Watchful waiting is what I would recommend.  It does not appear that anything acute is occurring.  Continue to wear the oxygen as prescribed.

## 2018-07-05 ENCOUNTER — Encounter: Payer: Self-pay | Admitting: Podiatry

## 2018-07-05 ENCOUNTER — Ambulatory Visit (INDEPENDENT_AMBULATORY_CARE_PROVIDER_SITE_OTHER): Payer: Medicare Other | Admitting: Podiatry

## 2018-07-05 ENCOUNTER — Other Ambulatory Visit: Payer: Self-pay

## 2018-07-05 VITALS — Temp 99.0°F

## 2018-07-05 DIAGNOSIS — R21 Rash and other nonspecific skin eruption: Secondary | ICD-10-CM | POA: Diagnosis not present

## 2018-07-05 MED ORDER — BETAMETHASONE DIPROPIONATE 0.05 % EX OINT: TOPICAL_OINTMENT | CUTANEOUS | 0 refills | Status: DC

## 2018-07-05 NOTE — Patient Instructions (Signed)
Rash, Adult  A rash is a change in the color of your skin. A rash can also change the way your skin feels. There are many different conditions and factors that can cause a rash. Follow these instructions at home: The goal of treatment is to stop the itching and keep the rash from spreading. Watch for any changes in your symptoms. Let your doctor know about them. Follow these instructions to help with your condition: Medicine Take or apply over-the-counter and prescription medicines only as told by your doctor. These may include medicines:  To treat red or swollen skin (corticosteroid creams).  To treat itching.  To treat an allergy (oral antihistamines).  To treat very bad symptoms (oral corticosteroids).  Skin care  Put cool cloths (compresses) on the affected areas.  Do not scratch or rub your skin.  Avoid covering the rash. Make sure that the rash is exposed to air as much as possible. General instructions  Rest as needed.  Drink enough fluid to keep your pee (urine) pale yellow.  Wear loose-fitting clothing.  Avoid scented soaps, detergents, and perfumes. Use gentle soaps, detergents, perfumes, and other cosmetic products.  Avoid anything that causes your rash. Keep a journal to help track what causes your rash. Write down: ? What you eat. ? What cosmetic products you use. ? What you drink. ? What you wear. This includes jewelry.  Keep all follow-up visits as told by your doctor. This is important. Contact a doctor if:  You sweat at night.  You lose weight.  You pee (urinate) more than normal.  You pee less than normal, or you notice that your pee is a darker color than normal.  You feel weak.  You throw up (vomit).  Your skin or the whites of your eyes look yellow (jaundice).  Your skin: ? Tingles. ? Is numb.  Your rash: ? Does not go away after a few days. ? Gets worse.  You are: ? More thirsty than normal. ? More tired than normal.  You have:  ? New symptoms. ? Pain in your belly (abdomen). ? A fever. ? Watery poop (diarrhea). Get help right away if:  You have a fever and your symptoms suddenly get worse.  You start to feel mixed up (confused).  You have a very bad headache or a stiff neck.  You have very bad joint pains or stiffness.  You have jerky movements that you cannot control (seizure).  Your rash covers all or most of your body. The rash may or may not be painful.  You have blisters that: ? Are on top of the rash. ? Grow larger. ? Grow together. ? Are painful. ? Are inside your nose or mouth.  You have a rash that: ? Looks like purple pinprick-sized spots all over your body. ? Has a "bull's eye" or looks like a target. ? Is red and painful, causes your skin to peel, and is not from being in the sun too long. Summary  A rash is a change in the color of your skin. A rash can also change the way your skin feels.  The goal of treatment is to stop the itching and keep the rash from spreading.  Take or apply over-the-counter and prescription medicines only as told by your doctor.  Contact a doctor if you have new symptoms or symptoms that get worse.  Keep all follow-up visits as told by your doctor. This is important. This information is not intended to replace advice  given to you by your health care provider. Make sure you discuss any questions you have with your health care provider. Document Released: 06/14/2007 Document Revised: 04/19/2018 Document Reviewed: 07/30/2017 Elsevier Patient Education  2020 [employer].

## 2018-07-09 ENCOUNTER — Telehealth: Payer: Self-pay | Admitting: Interventional Cardiology

## 2018-07-09 NOTE — Telephone Encounter (Signed)
Pt is calling in to speak with Dr. Tamala Julian and his nurse about conversation they had on 6/25.  Pt states that he is still experiencing lower extremity edema, feeling very fatigued and malaise, and is sob, but is sob all the time due to COPD.  Pt is on Oxygen all the time for COPD.  Pt states he really wants to see a APP tomorrow if possible, to be assessed from a cardiac perspective.  Pt states that with having both chronic conditions like CHF and COPD, its hard for him to decipher if his symptoms are cardiac related, or respiratory related.  Pt states that he has had no more chest pain, since 5 days ago when he spoke with Dr Tamala Julian and his nurse. Pt states he would feel reassured seeing someone and having possible labs drawn. Scheduled the pt to come in and see [name] Bhagat PA-C for tomorrow 7/1 at 3 pm. Pt will be accompanied by his wife, due to being wheelchair bound. Covid screening questions were performed on both the pt and his wife.  Both verbalized understanding and agrees with this plan.  Will route this message to Dr Tamala Julian and his RN as a general FYI.

## 2018-07-09 NOTE — Telephone Encounter (Signed)
    COVID-19 Pre-Screening Questions:  . In the past 7 to 10 days have you had a cough,  shortness of breath, headache, congestion, fever (100 or greater) body aches, chills, sore throat, or sudden loss of taste or sense of smell?-NO . Have you been around anyone with known Covid 19.-NO . Have you been around anyone who is awaiting Covid 19 test results in the past 7 to 10 days?-NO . Have you been around anyone who has been exposed to Covid 19, or has mentioned symptoms of Covid 19 within the past 7 to 10 days?-NO  Asked both the pt and his wife who will have to accompany the pt tomorrow 7/1 at his visit with [name] Bhagat PA-C, at 3 pm. Both answered no to screening questions.  Both are aware of strict mask policy and needing to wear this during the duration of the pts visit.  Pt has to have his wife come with him to his appt due to being wheelchair bound. Placed this in appt notes.

## 2018-07-09 NOTE — Telephone Encounter (Signed)
  Patient would like to speak to [facility] in regards to a previous conversation they had.

## 2018-07-09 NOTE — Telephone Encounter (Signed)
Anderson Malta B has asked me to start sending her calls. She is getting ready to sign out.

## 2018-07-10 ENCOUNTER — Other Ambulatory Visit: Payer: Self-pay

## 2018-07-10 ENCOUNTER — Ambulatory Visit (INDEPENDENT_AMBULATORY_CARE_PROVIDER_SITE_OTHER): Payer: Medicare Other | Admitting: Physician Assistant

## 2018-07-10 ENCOUNTER — Encounter: Payer: Self-pay | Admitting: Physician Assistant

## 2018-07-10 VITALS — BP 130/78 | HR 107 | Ht 69.0 in | Wt 135.8 lb

## 2018-07-10 DIAGNOSIS — I10 Essential (primary) hypertension: Secondary | ICD-10-CM | POA: Diagnosis not present

## 2018-07-10 DIAGNOSIS — I5032 Chronic diastolic (congestive) heart failure: Secondary | ICD-10-CM

## 2018-07-10 DIAGNOSIS — I25709 Atherosclerosis of coronary artery bypass graft(s), unspecified, with unspecified angina pectoris: Secondary | ICD-10-CM

## 2018-07-10 DIAGNOSIS — I2510 Atherosclerotic heart disease of native coronary artery without angina pectoris: Secondary | ICD-10-CM

## 2018-07-10 DIAGNOSIS — I739 Peripheral vascular disease, unspecified: Secondary | ICD-10-CM

## 2018-07-10 DIAGNOSIS — I208 Other forms of angina pectoris: Secondary | ICD-10-CM | POA: Diagnosis not present

## 2018-07-10 DIAGNOSIS — I479 Paroxysmal tachycardia, unspecified: Secondary | ICD-10-CM

## 2018-07-10 MED ORDER — METOPROLOL SUCCINATE ER 25 MG PO TB24: 25.0000 mg | ORAL_TABLET | Freq: Every day | ORAL | 3 refills | Status: DC

## 2018-07-10 MED ORDER — ISOSORBIDE MONONITRATE ER 30 MG PO TB24: 15.0000 mg | ORAL_TABLET | Freq: Every day | ORAL | 3 refills | Status: DC

## 2018-07-10 NOTE — Patient Instructions (Addendum)
Medication Instructions:  Your physician has recommended you make the following change in your medication:  1.  START Imdur 30 mg taking 1/2 tablet daily 2.  INCREASE the Toprol XL to 25 mg taking 1 whole tablet daily   If you need a refill on your cardiac medications before your next appointment, please call your pharmacy.   Lab work: TODAY:  BMET & PRO BNP   If you have labs (blood work) drawn today and your tests are completely normal, you will receive your results only by: Marland Kitchen MyChart Message (if you have MyChart) OR . A paper copy in the mail If you have any lab test that is abnormal or we need to change your treatment, we will call you to review the results.  Testing/Procedures: Your physician has requested that you have a lower extremity arterial duplex. During this test, exercise and ultrasound are used to evaluate arterial blood flow in the legs. Allow one hour for this exam. There are no restrictions or special instructions.   Follow-Up: At [facility], you and your health needs are our priority.  As part of our continuing mission to provide you with exceptional heart care, we have created designated Provider Care Teams.  These Care Teams include your primary Cardiologist (physician) and Advanced Practice Providers (APPs -  Physician Assistants and Nurse Practitioners) who all work together to provide you with the care you need, when you need it. You will need a follow up appointment in 4 weeks with Dr. Tamala Julian or APP on a day Dr Tamala Julian is in the office.   Care Team:   [name], NP Cecilie Kicks, NP . Kathyrn Drown, NP  Any Other Special Instructions Will Be Listed Below (If Applicable).

## 2018-07-10 NOTE — Progress Notes (Signed)
Cardiology Office Note    Date:  07/10/2018   ID:  [name], DOB 18-Feb-1929, MRN [id number]  PCP:  [name], MD  Cardiologist:  Dr. Tamala Julian  Pulmonary: Dr. [name] Chief Complaint: [place] Comment edema and fatigue  History of Present Illness:   [name] is a 85 y.o. male with a hx of CAD with prior coronary bypass grafting, COPD with cor pulmonale on 2 L oxygen and nocturnal ventilation, hyperlipidemia, hypertension, PAD,  carotid obstructive disease/PVd followed by Dr. Scot Dock, chronic diastolic heart failure added to schedule for Le edema and fatigue.   Last echo 03/09/17 with EF 60-65%, G1DD, telemetry monitor in June 2019 with SR to ST with PACs and PVCs. No a fib or tachycardia, sustained.   He was doing well on cardiac stand point when last seen by Dr. Tamala Julian 03/2018.  [name] presented with 1 to 2 weeks history of progressively worsening lower extremity edema and fatigue.  Last week he had a episode of substernal chest pressure while sitting.  Symptoms resolved with sublingual nitroglycerin x1.  No recurrent episode.  He has lower extremity edema bilaterally.   He used to walk 1 mile 2 times per week on a treadmill however unable to do so in past 2 to 3 weeks due to leg pain.  Reports intermittent heart racing without dyspnea.  He has intermittent low blood pressure with dizziness.   Past Medical History:  Diagnosis Date  . Arthritis    "generalized" (04/04/2017)  . Asthma   . Basal cell carcinoma    "left ear"  . BPH (benign prostatic hyperplasia)    severe; s/p multiple biopsies  . CAD (coronary artery disease)   . Carotid artery occlusion   . Chronic respiratory failure ([place])   . Chronic rhinitis   . COPD (chronic obstructive pulmonary disease) (HCC)    2L [place] O2  . Diastolic heart failure ([place]) 2019  . Dilation of biliary tract   . Elevated troponin 03/09/2017  . Gallstones   . GERD (gastroesophageal reflux disease)   . History of blood transfusion    "w/his CABG"  (04/04/2017)  . History of kidney stones   . Hyperlipidemia   . Hypertension   . On home oxygen therapy    "~ 24/7" (04/04/2017)  . Peripheral vascular disease ([place])   . Pneumonia 2019  . Syncope and collapse   . Ureteral tumor 08/2015   had endoscopic procedure for evaluation, unable to reach for biopsy    Past Surgical History:  Procedure Laterality Date  . BASAL CELL CARCINOMA EXCISION Left    ear  . CARDIAC CATHETERIZATION     "prior to bypass"  . CATARACT EXTRACTION W/ INTRAOCULAR LENS  IMPLANT, BILATERAL Bilateral   . CORONARY ARTERY BYPASS GRAFT  10/2001   LIMA to LAD, SVG to OM1-2, SVG to RCA and PDA  . LITHOTRIPSY    . PROSTATE BIOPSY  Oct. 2014  . TONSILLECTOMY      Current Medications: Prior to Admission medications   Medication Sig Start Date End Date Taking? Authorizing Provider  ALPRAZolam Duanne Moron) 0.25 MG tablet TAKE 1 TABLET BY MOUTH THREE TIMES DAILY AS NEEDED 05/27/18   Tanda Rockers, MD  amoxicillin-clavulanate (AUGMENTIN) 875-125 MG tablet TK 1 T PO Q 12 H FOR 10 DAYS 05/15/18   [provider]  aspirin 81 MG tablet Take 81 mg by mouth daily.    [provider]  atorvastatin (LIPITOR) 20 MG tablet TAKE 1  TABLET BY MOUTH EVERY DAY 05/24/16   Belva Crome, MD  azithromycin (ZITHROMAX) 250 MG tablet Take 2 tablets today, then 1 tablet daily until gone. 04/15/18   Tanda Rockers, MD  betamethasone dipropionate (DIPROLENE) 0.05 % ointment Apply to feet bid for 4 weeks. 07/05/18   Marzetta Board, DPM  cetirizine (ZYRTEC) 10 MG tablet Take 10 mg by mouth as needed for allergies.    [provider]  Cholecalciferol (VITAMIN D3) 2000 units TABS Take 2,000 Units by mouth daily.    [provider]  diclofenac sodium (VOLTAREN) 1 % GEL Apply 2 g topically 4 (four) times daily. 12/04/17   Geradine Girt, DO  finasteride (PROSCAR) 5 MG tablet Take 5 mg by mouth every morning.    [provider]  furosemide (LASIX) 20 MG  tablet TAKE 1 TABLET BY MOUTH EVERY DAY. MAY TAKE ADDITIONAL TABLET IF NEEDED FOR SWELLING 02/18/18   Tanda Rockers, MD  gabapentin (NEURONTIN) 100 MG capsule 1 capsule at night for a week, then take 1 twice daily 05/23/18   Kathrynn Ducking, MD  levalbuterol [facility] HFA) 45 MCG/ACT inhaler Inhale 2 puffs into the lungs every 4 (four) hours as needed for wheezing or shortness of breath. 05/10/18   Tanda Rockers, MD  levalbuterol (XOPENEX) 0.63 MG/3ML nebulizer solution USE 1 VIAL VIA NEBULIZER EVERY 4 HOURS AS NEEDED FOR WHEEZING OR SHORTNESS OF BREATH 05/10/18   Tanda Rockers, MD  mometasone (NASONEX) 50 MCG/ACT nasal spray SHAKE LIQUID AND USE 2 SPRAYS IN [facility] NOSTRIL DAILY 03/28/18   Tanda Rockers, MD  Multiple Vitamins-Minerals (PRESERVISION AREDS 2) CAPS Take 1 capsule by mouth daily.    [provider]  nitroGLYCERIN (NITROSTAT) 0.4 MG SL tablet Place 1 tablet (0.4 mg total) under the tongue every 5 (five) minutes as needed for chest pain. 01/04/18   Isaiah Serge, NP  OXYGEN Inhale 2 L continuous into the lungs.     [provider]  pantoprazole (PROTONIX) 40 MG tablet TAKE 1 TABLET(40 MG) BY MOUTH DAILY 30 TO 60 MINUTES BEFORE FIRST MEAL OF THE DAY 06/04/18   Tanda Rockers, MD  predniSONE (DELTASONE) 10 MG tablet FOR FLARE OF COUGH/WHEEZING MAY INCREASE TO 2 DAILY BETTER, THEN 1 DAILY 05/27/18   Tanda Rockers, MD  ranitidine (ZANTAC) 150 MG capsule Take 150 mg by mouth 2 (two) times daily.    [provider]  sodium chloride (MURO 128) 2 % ophthalmic solution 1 drop.    [provider]  SPIRIVA RESPIMAT 2.5 MCG/ACT AERS Inhale 2 puffs into the lungs daily. 03/05/17   [provider]  spironolactone (ALDACTONE) 25 MG tablet Take 0.5 tablets (12.5 mg total) by mouth daily. 07/02/17 06/27/18  Belva Crome, MD  SYMBICORT 160-4.5 MCG/ACT inhaler INHALE 2 PUFFS INTO THE LUNGS EVERY MORNING AND ANOTHER 2 PUFFS ABOUT 12 HOURS LATER 05/27/18   Tanda Rockers, MD  TOPROL XL 25 MG 24 hr tablet TAKE 1/2 TABLET BY MOUTH EVERY DAY 05/31/18   Belva Crome, MD  vitamin B-12 (CYANOCOBALAMIN) 100 MCG tablet Take 100 mcg by mouth daily.    [provider]    Allergies:   Cefdinir, Nitrofurantoin, Sulfa antibiotics, Sulfonamide derivatives, Cefdinir, Ciprofloxacin, Nitrofurantoin, Doxycycline, Sertraline, Ciprofloxacin, Levaquin [levofloxacin], and Levaquin [levofloxacin]   Social History   Socioeconomic History  . Marital status: Married    Spouse name: Not on file  . Number of children: 2  .  Years of education: Not on file  . Highest education level: Not on file  Occupational History  . 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 Financial resource strain: Not on file  . Food insecurity    Worry: Not on file    Inability: Not on file  . Transportation needs    Medical: Not on file    Non-medical: Not on file  Tobacco Use  . Smoking status: Former Smoker    Packs/day: 2.00    Years: 33.00    Pack years: 66.00    Types: Cigarettes    Quit date: 03/25/1982    Years since quitting: 36.3  . Smokeless tobacco: Never Used  Substance and Sexual Activity  . Alcohol use: Yes    Alcohol/week: 0.0 standard drinks    Comment: 1.5-3 oz daily in the past, minimal use in the last few months  . Drug use: No  . Sexual activity: Not on file  Lifestyle  . Physical activity    Days per week: Not on file    Minutes per session: Not on file  . Stress: Not on file  Relationships  . Social Herbalist on phone: Not on file    Gets together: Not on file    Attends religious service: Not on file    Active member of club or organization: Not on file    Attends meetings of clubs or organizations: Not on file    Relationship status: Not on file  Other Topics Concern  . Not on file  Social History Narrative   ** Merged History Encounter **       Pt lives at home with his spouse.            Family History:  The patient's family history includes Allergies in his brother; Breast cancer in his mother; Cancer in his mother; Heart attack in his father; Heart disease in his brother, brother, father, and mother; Hyperlipidemia in his brother; Hypertension in his brother and mother; Other in his mother; Peripheral vascular disease in his brother; Stroke in his brother.   ROS:   Please see the history of present illness.    ROS All other systems reviewed and are negative.   PHYSICAL EXAM:   VS:  BP 130/78   Pulse (!) 107   Ht 5\' 9"  (1.753 m)   Wt 135 lb 12.8 oz (61.6 kg)   SpO2 100%   BMI 20.05 kg/m    GEN: Elderly frail male, in no acute distress  HEENT: normal  Neck: no JVD, carotid bruits, or masses Cardiac:RRR; no murmurs, rubs, or gallops, 1-2+ bilateral lower extremity edema with right leg erythema Respiratory:  clear to auscultation bilaterally, normal work of breathing GI: soft, nontender, nondistended, + BS MS: no deformity or atrophy  Skin: warm and dry, no rash Neuro:  Alert and Oriented x 3, Strength and sensation are intact Psych: euthymic mood, full affect  Wt Readings from Last 3 Encounters:  07/10/18 135 lb 12.8 oz (61.6 kg)  03/20/18 133 lb 9.6 oz (60.6 kg)  03/11/18 128 lb 9.6 oz (58.3 kg)      Studies/Labs Reviewed:   EKG:  EKG is ordered today.  The ekg ordered today demonstrates sinus tachycardia at rate of 107 bpm, PAC, right bundle branch block  Recent Labs: 12/03/2017: B Natriuretic Peptide 104.6; Hemoglobin 13.1; Platelets 241; TSH 2.212 01/04/2018: Magnesium 2.6; NT-Pro BNP 460 03/01/2018: BUN 22; Creatinine,  Ser 1.15; Potassium 4.1; Sodium 139   Lipid Panel    Component Value Date/Time   CHOL 146 03/26/2017 0621   TRIG 34 03/26/2017 0621   HDL 79 03/26/2017 0621   CHOLHDL 1.8 03/26/2017 0621   VLDL 7 03/26/2017 0621   LDLCALC 60 03/26/2017 0621    Additional studies/ records that were reviewed today include:   Monitor  06/12/2017  Sinus rhythm and sinus tachycardia  PACs and PVCs  No atrial fibrillation or sustained tachycardia   Echocardiogram: 03/10/2018 Study Conclusions  - Left ventricle: The cavity size was normal. Systolic function was   normal. The estimated ejection fraction was in the range of 60%   to 65%. Wall motion was normal; there were no regional wall   motion abnormalities. Doppler parameters are consistent with   abnormal left ventricular relaxation (grade 1 diastolic   dysfunction). - Aortic valve: Transvalvular velocity was within the normal range.   There was no stenosis. There was no regurgitation. - Mitral valve: Transvalvular velocity was within the normal range.   There was no evidence for stenosis. There was trivial   regurgitation. - Right ventricle: The cavity size was normal. Wall thickness was   normal. Systolic function was reduced. - Tricuspid valve: There was mild regurgitation. - Pulmonary arteries: Systolic pressure was within the normal   range. PA peak pressure: 24 mm Hg (S). - Pericardium, extracardiac: A trivial pericardial effusion was   identified.  Stress test 03/2017 IMPRESSION: 1. Large infarct involving the inferior wall including the inferior apex. Difficult to exclude peri-infarct ischemia at the apex and recommend correlation with ECG testing.  2. Abnormal wall motion along the inferior wall base and septal wall as described.  3. Left ventricular ejection fraction is 48%.  4. Non invasive risk stratification*: Intermediate   ASSESSMENT & PLAN:    1. CAD with angina -Episode last week concerning for unstable angina.  Occurred at rest.  Resolved with sublingual nitroglycerin x1. -Last stress test 03/2017 with possible peri-infract ischemia at the apex which felt intermediate risk. -Patient declined further ischemic evaluation either with stress test or cardiac cath. -Continue aspirin, statin and beta-blocker -Add low-dose Imdur and  increase Toprol-XL to 25 mg  2.  Bilateral lower extremity edema -Suspect due to worsening peripheral vascular disease.  He has leg pain with ambulation.  Used to walk 1 mile on treadmill now unable to do so due to leg pain. -Difficult to palpate lower extremity pulses due to significant swelling.  Somewhat erythema but no cold extremity. -Get lower extremity arterial Doppler with ABI -He will continue to take Lasix 40 mg daily.  Leg elevation. -Check bmet.  3.  Peripheral vascular disease/carotid disease -Advised to follow-up with vascular  4.  Tachycardia -Patient reports intermittent heart racing.  Heart rate of 107 today.  Denies monitor placement. -Increase metoprolol as above   Medication Adjustments/Labs and Tests Ordered: Current medicines are reviewed at length with the patient today.  Concerns regarding medicines are outlined above.  Medication changes, Labs and Tests ordered today are listed in the Patient Instructions below. Patient Instructions  Medication Instructions:  Your physician has recommended you make the following change in your medication:  1.  START Imdur 30 mg taking 1/2 tablet daily 2.  INCREASE the Toprol XL to 25 mg taking 1 whole tablet daily   If you need a refill on your cardiac medications before your next appointment, please call your pharmacy.   Lab work: TODAY:  BMET &  PRO BNP   If you have labs (blood work) drawn today and your tests are completely normal, you will receive your results only by: Marland Kitchen MyChart Message (if you have MyChart) OR . A paper copy in the mail If you have any lab test that is abnormal or we need to change your treatment, we will call you to review the results.  Testing/Procedures: Your physician has requested that you have a lower extremity arterial duplex. During this test, exercise and ultrasound are used to evaluate arterial blood flow in the legs. Allow one hour for this exam. There are no restrictions or special  instructions.   Follow-Up: At [facility], you and your health needs are our priority.  As part of our continuing mission to provide you with exceptional heart care, we have created designated Provider Care Teams.  These Care Teams include your primary Cardiologist (physician) and Advanced Practice Providers (APPs -  Physician Assistants and Nurse Practitioners) who all work together to provide you with the care you need, when you need it. You will need a follow up appointment in 4 weeks with Dr. Tamala Julian or APP on a day Dr Tamala Julian is in the office.   Care Team:   [name], NP Cecilie Kicks, NP . Kathyrn Drown, NP  Any Other Special Instructions Will Be Listed Below (If Applicable).       Jarrett Soho, [place]  07/10/2018 [facility] [place], [place], [place]  [address] Phone: [phone]; Fax: [phone]

## 2018-07-11 ENCOUNTER — Other Ambulatory Visit (HOSPITAL_COMMUNITY): Payer: Self-pay | Admitting: Physician Assistant

## 2018-07-11 DIAGNOSIS — I739 Peripheral vascular disease, unspecified: Secondary | ICD-10-CM

## 2018-07-12 LAB — PRO B NATRIURETIC PEPTIDE: NT-Pro BNP: 648 pg/mL — ABNORMAL HIGH (ref 0–486)

## 2018-07-12 LAB — BASIC METABOLIC PANEL
BUN/Creatinine Ratio: 15 (ref 10–24)
BUN: 17 mg/dL (ref 8–27)
CO2: 32 mmol/L — ABNORMAL HIGH (ref 20–29)
Calcium: 9.3 mg/dL (ref 8.6–10.2)
Chloride: 94 mmol/L — ABNORMAL LOW (ref 96–106)
Creatinine, Ser: 1.11 mg/dL (ref 0.76–1.27)
GFR calc Af Amer: 69 mL/min/{1.73_m2} (ref >59)
GFR calc non Af Amer: 60 mL/min/{1.73_m2} (ref >59)
Glucose: 148 mg/dL — ABNORMAL HIGH (ref 65–99)
Potassium: 4.1 mmol/L (ref 3.5–5.2)
Sodium: 137 mmol/L (ref 134–144)

## 2018-07-13 NOTE — Progress Notes (Signed)
Subjective:  [name] presents today accompanied by his wife. He relates rash spreading b/l feet accompanied by itching and burning sensation. Duration for the past 2 weeks. He denies any drainage, fever, chills, nightsweats, nausea or vomiting.  [name], MD is his PCP and last visit was within the last 3 weeks per patient recall.  Allergies  Allergen Reactions  . Cefdinir Other (See Comments)    Severe Diarrhea  . Nitrofurantoin Swelling    Hand Swelling  . Sulfa Antibiotics Anaphylaxis and Swelling  . Sulfonamide Derivatives Swelling    REACTION: facial/tongue swelling  . Cefdinir Diarrhea    REACTION: diarrhea  . Ciprofloxacin Itching and Rash    Red itchy hands  . Nitrofurantoin Swelling    Swollen hands  . Doxycycline Other (See Comments)    Felt terrible  . Sertraline Other (See Comments)    jittery  . Ciprofloxacin Itching and Rash  . Levaquin [Levofloxacin] Itching and Rash  . Levaquin [Levofloxacin] Itching and Rash     Objective: Vitals:   07/05/18 1424  Temp: 99 F (37.2 C)    Physical Examination:  Vascular Examination: Capillary refill time less than 3 seconds x 10 digits.  Palpable DP/PT nonpalpable b/l.  Digital hair absent b/l.  No edema noted b/l.  Skin temperature gradient WNL b/l.  Chronic lower extremity edema noted bilaterally.   Dermatological Examination: Skin is thin, shiny and atrophic b/l.  No open wounds b/l.  Skin eruption b/l feet with petechiae noted. There is peeling skin. No warmth. No blistering.   No interdigital macerations noted b/l.  Thick, discolored brittle toenails with subungual debris and pain on dorsal palpation of nailbeds 1-5 b/l.  Musculoskeletal Examination: Muscle strength 5/5 to all muscle groups b/l.  No pain, crepitus or joint discomfort with active/passive ROM.  Neurological Examination: Sensation intact 5/5 b/l with 10 gram monofilament.  Vibratory sensation intact  b/l.  Assessment: Rash b/l  Plan: 1.  Rx written for Betamethasone Ointment 0.05% to be applied to both feet bid for 4 weeks.  2.  Continue soft, supportive shoe gear daily. 3.  Report any pedal injuries to medical professional. 4.  Follow up 3 months for routine foot care, but wife to call office if his feet get worse with current treatment plan. 5.  Patient/POA to call should there be a question/concern in there interim.

## 2018-07-16 ENCOUNTER — Other Ambulatory Visit: Payer: Medicare Other | Admitting: Licensed Clinical Social Worker

## 2018-07-16 ENCOUNTER — Ambulatory Visit: Payer: Medicare Other | Admitting: Podiatry

## 2018-07-16 ENCOUNTER — Other Ambulatory Visit: Payer: Self-pay

## 2018-07-16 DIAGNOSIS — Z515 Encounter for palliative care: Secondary | ICD-10-CM

## 2018-07-16 NOTE — Progress Notes (Signed)
COMMUNITY PALLIATIVE CARE SW NOTE  PATIENT NAME: [name] DOB: February 23, 1929 MRN: [id number]  PRIMARY CARE PROVIDER: [name], MD  RESPONSIBLE PARTY:  Acct ID - Guarantor Home Phone Work Phone Relationship Acct Type  [id number] - Garrido,Khristopher [name] [phone]  Self P/F     [address], [name], [place] [address]   Due to the COVID-19 crisis, this virtual check-in visit was done via telephone from my office and it was initiated and consent given by this patient and or family.  PLAN OF CARE and INTERVENTIONS:             1. GOALS OF CARE/ ADVANCE CARE PLANNING:  Goal is for patient to remain at home with his wife, [name].  Patient is a full code. 2. SOCIAL/EMOTIONAL/SPIRITUAL ASSESSMENT/ INTERVENTIONS:  SW conducted a Sales executive with patient's wife, [name], in her home.  She stated patient is continuing to decline.  His leg edema has worsened and she has communicated with patient's doctors.  He is sleeping well, but still feels exhausted after he wakes up.  [name] inquired about spiritual care, but patient declines.  She will discuss with him further and will contact a church.  SW provided active listening and supportive counseling while she discussed difficulties isolating with the pandemic.  Her daughter grocery shops for her. 3. PATIENT/CAREGIVER EDUCATION/ COPING:  Patient copes by continuing to educate himself about his illnesses.  [name] copes by expressing her feelings openly. 4. PERSONAL EMERGENCY PLAN:  [name] will contact patient's doctors for assistance. 5. COMMUNITY RESOURCES COORDINATION/ HEALTH CARE NAVIGATION:  None. 6. FINANCIAL/LEGAL CONCERNS/INTERVENTIONS:  None.     SOCIAL HX:  Social History   Tobacco Use  . Smoking status: Former Smoker    Packs/day: 2.00    Years: 33.00    Pack years: 66.00    Types: Cigarettes    Quit date: 03/25/1982    Years since quitting: 36.3  . Smokeless tobacco: Never Used  Substance Use Topics  . Alcohol use: Yes   Alcohol/week: 0.0 standard drinks    Comment: 1.5-3 oz daily in the past, minimal use in the last few months    CODE STATUS:  Full Code ADVANCED DIRECTIVES: N MOST FORM COMPLETE:  N HOSPICE EDUCATION PROVIDED: N PPS:  Patient's appetite is normal.  He ambulates independently. Duration of visit and documentation:  45 minutes.     Creola Corn Cray Monnin, LCSW

## 2018-07-19 ENCOUNTER — Ambulatory Visit (HOSPITAL_COMMUNITY)
Admission: RE | Admit: 2018-07-19 | Payer: Medicare Other | Source: Ambulatory Visit | Attending: Physician Assistant | Admitting: Physician Assistant

## 2018-07-26 ENCOUNTER — Other Ambulatory Visit: Payer: Self-pay | Admitting: Interventional Cardiology

## 2018-08-01 ENCOUNTER — Ambulatory Visit (HOSPITAL_COMMUNITY)
Admission: RE | Admit: 2018-08-01 | Discharge: 2018-08-01 | Disposition: A | Discharge status: Home / Self Care | Payer: Medicare Other | Source: Ambulatory Visit | Attending: Cardiology | Admitting: Cardiology

## 2018-08-01 ENCOUNTER — Other Ambulatory Visit: Payer: Self-pay

## 2018-08-01 ENCOUNTER — Encounter

## 2018-08-01 ENCOUNTER — Ambulatory Visit: Payer: Medicare Other | Admitting: Cardiology

## 2018-08-01 DIAGNOSIS — I2510 Atherosclerotic heart disease of native coronary artery without angina pectoris: Secondary | ICD-10-CM | POA: Diagnosis not present

## 2018-08-01 DIAGNOSIS — I5032 Chronic diastolic (congestive) heart failure: Secondary | ICD-10-CM | POA: Diagnosis not present

## 2018-08-01 DIAGNOSIS — I10 Essential (primary) hypertension: Secondary | ICD-10-CM | POA: Insufficient documentation

## 2018-08-01 DIAGNOSIS — I739 Peripheral vascular disease, unspecified: Secondary | ICD-10-CM | POA: Insufficient documentation

## 2018-08-02 ENCOUNTER — Telehealth: Payer: Self-pay | Admitting: *Deleted

## 2018-08-02 NOTE — Telephone Encounter (Signed)
Pt has been made aware of his VAS [place] results. See result note.

## 2018-08-02 NOTE — Telephone Encounter (Signed)
Pt contacted re: appt 08/05/2018 and has answered no the following questions:      COVID-19 Pre-Screening Questions:  . In the past 7 to 10 days have you had a cough,  shortness of breath, headache, congestion, fever (100 or greater) body aches, chills, sore throat, or sudden loss of taste or sense of smell? . Have you been around anyone with known Covid 19. . Have you been around anyone who is awaiting Covid 19 test results in the past 7 to 10 days? . Have you been around anyone who has been exposed to Covid 19, or has mentioned symptoms of Covid 19 within the past 7 to 10 days?  If you have any concerns/questions about symptoms patients report during screening (either on the phone or at threshold). Contact the provider seeing the patient or DOD for further guidance.  If neither are available contact a member of the leadership team.

## 2018-08-05 ENCOUNTER — Ambulatory Visit (INDEPENDENT_AMBULATORY_CARE_PROVIDER_SITE_OTHER): Payer: Medicare Other | Admitting: Cardiology

## 2018-08-05 ENCOUNTER — Encounter: Payer: Self-pay | Admitting: Cardiology

## 2018-08-05 ENCOUNTER — Other Ambulatory Visit: Payer: Self-pay

## 2018-08-05 VITALS — BP 130/64 | HR 59 | Ht 69.0 in | Wt 136.4 lb

## 2018-08-05 DIAGNOSIS — I739 Peripheral vascular disease, unspecified: Secondary | ICD-10-CM

## 2018-08-05 DIAGNOSIS — I479 Paroxysmal tachycardia, unspecified: Secondary | ICD-10-CM | POA: Diagnosis not present

## 2018-08-05 DIAGNOSIS — J449 Chronic obstructive pulmonary disease, unspecified: Secondary | ICD-10-CM | POA: Diagnosis not present

## 2018-08-05 DIAGNOSIS — I5032 Chronic diastolic (congestive) heart failure: Secondary | ICD-10-CM | POA: Diagnosis not present

## 2018-08-05 DIAGNOSIS — I208 Other forms of angina pectoris: Secondary | ICD-10-CM | POA: Diagnosis not present

## 2018-08-05 DIAGNOSIS — I2510 Atherosclerotic heart disease of native coronary artery without angina pectoris: Secondary | ICD-10-CM | POA: Diagnosis not present

## 2018-08-05 DIAGNOSIS — I2781 Cor pulmonale (chronic): Secondary | ICD-10-CM | POA: Diagnosis not present

## 2018-08-05 NOTE — Patient Instructions (Signed)
Medication Instructions:  Your physician recommends that you continue on your current medications as directed. Please refer to the Current Medication list given to you today.  If you need a refill on your cardiac medications before your next appointment, please call your pharmacy.   Lab work: NONE If you have labs (blood work) drawn today and your tests are completely normal, you will receive your results only by: Marland Kitchen MyChart Message (if you have MyChart) OR . A paper copy in the mail If you have any lab test that is abnormal or we need to change your treatment, we will call you to review the results.  Testing/Procedures: NONE  Follow-Up: At [facility], you and your health needs are our priority.  As part of our continuing mission to provide you with exceptional heart care, we have created designated Provider Care Teams.  These Care Teams include your primary Cardiologist (physician) and Advanced Practice Providers (APPs -  Physician Assistants and Nurse Practitioners) who all work together to provide you with the care you need, when you need it. You will need a follow up appointment in 3 months.   You may see Sinclair Grooms, MD or one of the following Advanced Practice Providers on your designated Care Team:   [name], NP Cecilie Kicks, NP . Kathyrn Drown, NP  Any Other Special Instructions Will Be Listed Below (If Applicable).

## 2018-08-05 NOTE — Progress Notes (Signed)
Cardiology Office Note   Date:  08/05/2018   ID:  JUMAANE WEATHERFORD, DOB [birth year], MRN [id number]  PCP:  [name], MD  Cardiologist:  Dr. Tamala Julian     Chief Complaint  Patient presents with  . Chest Pain      History of Present Illness: [name] is a 85 y.o. male who presents for follow up after chest pain and medicaiton adjustment.    He has a hx of CAD with prior coronary bypass grafting, COPD with cor pulmonale on 2 L oxygen and nocturnal ventilation, hyperlipidemia, hypertension, PAD,  carotid obstructive disease/PVd followed by Dr. Scot Dock, chronic diastolic heart failure and LE edema and fatigue.   Last echo 03/09/17 with EF 60-65%, G1DD, telemetry monitor in June 2019 with SR to ST with PACs and PVCs. No a fib or tachycardia, sustained.   He was doing well on cardiac stand point when last seen by Dr. Tamala Julian 03/2018.  Mr. Gulla presented 07/10/18 with 1 to 2 weeks history of progressively worsening lower extremity edema and fatigue.  Last week he had a episode of substernal chest pressure while sitting.  Symptoms resolved with sublingual nitroglycerin x1.  No recurrent episode.  He has lower extremity edema bilaterally.   He used to walk 1 mile 2 times per week on a treadmill however unable to do so in past 2 to 3 weeks due to leg pain.  Reports intermittent heart racing without dyspnea.  He has intermittent low blood pressure with dizziness. It is difficult to treat edema with lasix due to hypotension.    On last visit he was placed on low dose imdur was added and toprol was increased to 25 mg.   Heart racing, pro BNP 648 Cr 1.11 k+ 4.1   Lower ext arterial dopplers abnormal to follow with Dr. Scot Dock.   Today for follow up --he is on 24/7 oxygen and uses trilogy at night to sleep COPD is end stage.  He does have palliative care.  He is fragile and at times goes through episodes of lower ext edema.  He wear support stockings.  He has had lower ext rash per haps from  edema, he has seen Derm.  He did not increase meds or begin imdur due to hypotension.  When he feels bad he becomes anxious, understandably.  He will keep legs elevated.  Dr. Tamala Julian has had discussions that there is not much we can offer.   He does ask if he can have bourbon at night. We gave that ok.    Past Medical History:  Diagnosis Date  . Arthritis    "generalized" (04/04/2017)  . Asthma   . Basal cell carcinoma    "left ear"  . BPH (benign prostatic hyperplasia)    severe; s/p multiple biopsies  . CAD (coronary artery disease)   . Carotid artery occlusion   . Chronic respiratory failure ([facility])   . Chronic rhinitis   . COPD (chronic obstructive pulmonary disease) ([facility])    2L [place] O2  . Diastolic heart failure ([facility]) 2019  . Dilation of biliary tract   . Elevated troponin 03/09/2017  . Gallstones   . GERD (gastroesophageal reflux disease)   . History of blood transfusion    "w/his CABG" (04/04/2017)  . History of kidney stones   . Hyperlipidemia   . Hypertension   . On home oxygen therapy    "~ 24/7" (04/04/2017)  . Peripheral vascular disease ([facility])   . Pneumonia 2019  .  Syncope and collapse   . Ureteral tumor 08/2015   had endoscopic procedure for evaluation, unable to reach for biopsy    Past Surgical History:  Procedure Laterality Date  . BASAL CELL CARCINOMA EXCISION Left    ear  . CARDIAC CATHETERIZATION     "prior to bypass"  . CATARACT EXTRACTION W/ INTRAOCULAR LENS  IMPLANT, BILATERAL Bilateral   . CORONARY ARTERY BYPASS GRAFT  10/2001   LIMA to LAD, SVG to OM1-2, SVG to RCA and PDA  . LITHOTRIPSY    . PROSTATE BIOPSY  Oct. 2014  . TONSILLECTOMY       Current Outpatient Medications  Medication Sig Dispense Refill  . ALPRAZolam (XANAX) 0.25 MG tablet TAKE 1 TABLET BY MOUTH THREE TIMES DAILY AS NEEDED 90 tablet 3  . aspirin 81 MG tablet Take 81 mg by mouth daily.    Marland Kitchen atorvastatin (LIPITOR) 20 MG tablet TAKE 1 TABLET BY MOUTH EVERY DAY 90 tablet 2  .  cetirizine (ZYRTEC) 10 MG tablet Take 10 mg by mouth as needed for allergies.    . Cholecalciferol (VITAMIN D3) 2000 units TABS Take 2,000 Units by mouth daily.    . finasteride (PROSCAR) 5 MG tablet Take 5 mg by mouth every morning.    . furosemide (LASIX) 20 MG tablet TAKE 1 TABLET BY MOUTH EVERY DAY. MAY TAKE ADDITIONAL TABLET IF NEEDED FOR SWELLING 45 tablet 2  . levalbuterol (XOPENEX HFA) 45 MCG/ACT inhaler Inhale 2 puffs into the lungs every 4 (four) hours as needed for wheezing or shortness of breath. 15 g 3  . levalbuterol (XOPENEX) 0.63 MG/3ML nebulizer solution USE 1 VIAL VIA NEBULIZER EVERY 4 HOURS AS NEEDED FOR WHEEZING OR SHORTNESS OF BREATH 300 mL 3  . metoprolol succinate (TOPROL-XL) 25 MG 24 hr tablet Take 12.5 mg by mouth daily.    . mometasone (NASONEX) 50 MCG/ACT nasal spray SHAKE LIQUID AND USE 2 SPRAYS IN EACH NOSTRIL DAILY 17 g 3  . Multiple Vitamins-Minerals (PRESERVISION AREDS 2) CAPS Take 1 capsule by mouth daily.    . nitroGLYCERIN (NITROSTAT) 0.4 MG SL tablet Place 1 tablet (0.4 mg total) under the tongue every 5 (five) minutes as needed for chest pain. 25 tablet 3  . OXYGEN Inhale 2 L continuous into the lungs.     . pantoprazole (PROTONIX) 40 MG tablet TAKE 1 TABLET(40 MG) BY MOUTH DAILY 30 TO 60 MINUTES BEFORE FIRST MEAL OF THE DAY 30 tablet 3  . predniSONE (DELTASONE) 10 MG tablet FOR FLARE OF COUGH/WHEEZING MAY INCREASE TO 2 DAILY BETTER, THEN 1 DAILY 100 tablet 2  . sodium chloride (MURO 128) 2 % ophthalmic solution 1 drop.    . SPIRIVA RESPIMAT 2.5 MCG/ACT AERS Inhale 2 puffs into the lungs daily.  11  . spironolactone (ALDACTONE) 25 MG tablet TAKE 1/2 TABLET BY MOUTH EVERY DAY 45 tablet 3  . SYMBICORT 160-4.5 MCG/ACT inhaler INHALE 2 PUFFS INTO THE LUNGS EVERY MORNING AND ANOTHER 2 PUFFS ABOUT 12 HOURS LATER 10.2 g 11  . vitamin B-12 (CYANOCOBALAMIN) 100 MCG tablet Take 100 mcg by mouth daily.     No current facility-administered medications for this visit.      Allergies:   Cefdinir, Nitrofurantoin, Sulfa antibiotics, Sulfonamide derivatives, Cefdinir, Ciprofloxacin, Nitrofurantoin, Doxycycline, Sertraline, Ciprofloxacin, Levaquin [levofloxacin], and Levaquin [levofloxacin]    Social History:  The patient  reports that he quit smoking about 36 years ago. His smoking use included cigarettes. He has a 66.00 pack-year smoking history. He has never  used smokeless tobacco. He reports current alcohol use. He reports that he does not use drugs. Family History:  The patient's family history includes Allergies in his brother; Breast cancer in his mother; Cancer in his mother; Heart attack in his father; Heart disease in his brother, brother, father, and mother; Hyperlipidemia in his brother; Hypertension in his brother and mother; Other in his mother; Peripheral vascular disease in his brother; Stroke in his brother.    ROS:  General:no colds or fevers, no weight changes Skin:no rashes or ulcers HEENT:no blurred vision, no congestion CV:see HPI PUL:see HPI GI:no diarrhea constipation or melena, no indigestion GU:no hematuria, no dysuria MS:no joint pain, no claudication Neuro:no syncope, no lightheadedness, feels he is in a fog, though sp02 is stable.  Endo:no diabetes, no thyroid disease  Wt Readings from Last 3 Encounters:  08/05/18 136 lb 6.4 oz (61.9 kg)  07/10/18 135 lb 12.8 oz (61.6 kg)  03/20/18 133 lb 9.6 oz (60.6 kg)     PHYSICAL EXAM: VS:  BP 130/64   Pulse (!) 59   Ht 5\' 9"  (1.753 m)   Wt 136 lb 6.4 oz (61.9 kg)   SpO2 99%   BMI 20.14 kg/m  , BMI Body mass index is 20.14 kg/m. General:Pleasant affect, NAD Skin:Warm and dry, brisk capillary refill HEENT:normocephalic, sclera clear, mucus membranes moist Neck:supple, no JVD sitting up , no bruits  Heart:S1S2 RRR without murmur, gallup, rub or click Lungs:clear to diminished without rales, rhonchi, or wheezes JSE:GBTD, non tender, + BS, do not palpate liver spleen or masses Ext:2+  lower ext edema,  2+ radial pulses Neuro:alert and oriented X 3, MAE, follows commands, + facial symmetry    EKG:  EKG is NOT ordered today. On last visit HR was elevated and today controlled.   Recent Labs: 12/03/2017: B Natriuretic Peptide 104.6; Hemoglobin 13.1; Platelets 241; TSH 2.212 01/04/2018: Magnesium 2.6 07/10/2018: BUN 17; Creatinine, Ser 1.11; NT-Pro BNP 648; Potassium 4.1; Sodium 137    Lipid Panel    Component Value Date/Time   CHOL 146 03/26/2017 0621   TRIG 34 03/26/2017 0621   HDL 79 03/26/2017 0621   CHOLHDL 1.8 03/26/2017 0621   VLDL 7 03/26/2017 0621   LDLCALC 60 03/26/2017 0621       Other studies Reviewed: Additional studies/ records that were reviewed today include: . ABI Summary: Right: Resting right ankle-brachial index indicates noncompressible right lower extremity arteries. Unable to obtain toe pressure due to flat PPG signal. Tibial waveform indicates severe peripheral arterial disease. Left: Resting left ankle-brachial index indicates noncompressible left lower extremity arteries.The left toe-brachial index is abnormal.  Tibial waveform indicates severe peripheral arterial disease.   Echo 3.1.29 Study Conclusions  - Left ventricle: The cavity size was normal. Systolic function was   normal. The estimated ejection fraction was in the range of 60%   to 65%. Wall motion was normal; there were no regional wall   motion abnormalities. Doppler parameters are consistent with   abnormal left ventricular relaxation (grade 1 diastolic   dysfunction). - Aortic valve: Transvalvular velocity was within the normal range.   There was no stenosis. There was no regurgitation. - Mitral valve: Transvalvular velocity was within the normal range.   There was no evidence for stenosis. There was trivial   regurgitation. - Right ventricle: The cavity size was normal. Wall thickness was   normal. Systolic function was reduced. - Tricuspid valve: There was  mild regurgitation. - Pulmonary arteries: Systolic pressure was within the  normal   range. PA peak pressure: 24 mm Hg (S). - Pericardium, extracardiac: A trivial pericardial effusion was   identified.   ASSESSMENT AND PLAN:  1.  Chest pain - no further episodes, NtG prn. Pt did not wish to take imdur due to hypotension. He does not wish to pursue cath or nuc  Follow up with Dr. Tamala Julian in 3 months.   2.  CAD with prior CABG see above  3.  End stage COPD with asthma componenton palliative care. Has 02 24/7 and trilogy at night.  To have re-evaluated.  Followed by Dr. [name].  4.  PAD with abnormal ABIS- he has had increased pain in his legs to follow with vascular.   5.  bil lower ext edema, wears support stockings unable to increase lasix due to hypotension.   6.  Tachycardia now improved without increasing BB   Pt will be 89 tomorrow. Happy birthday.   Current medicines are reviewed with the patient today.  The patient Has no concerns regarding medicines.  The following changes have been made:  See above Labs/ tests ordered today include:see above  Disposition:   FU:  see above  Signed, Cecilie Kicks, NP  08/05/2018 2:55 PM    [facility] [place], [place], [place], [place] Phone: [phone]; Fax: [phone]

## 2018-08-07 ENCOUNTER — Telehealth: Payer: Self-pay | Admitting: Internal Medicine

## 2018-08-07 NOTE — Telephone Encounter (Signed)
Spoke with patient. He is aware of MW's recommendations. Verbalized understanding. Nothing further needed at time of call.

## 2018-08-07 NOTE — Telephone Encounter (Signed)
Primary Pulmonologist: MW Last office visit and with whom: 04/22/2018 w/ TP What do we see them for (pulmonary problems): COPD, Chronic Respiratory Failure with hypoxia  Reason for call: Pt states he is unsure if he took his Spiriva (2 puffs daily) this morning or not. He normally takes his daily dose at 10 AM. Pt states he is having increased SOB. Denies cough, wheezing, chest pain/tightness. Denies fever/chills/muscle aches. Pt is inquiring if it would be okay to go ahead and take his dose of Spiriva even though he is unsure if he took it already. Pt states he takes his albuterol rescue inhaler 3-4 times daily.   In the last month, have you been in contact with someone who was confirmed or suspected to have [place]avirus / COVID-19?  No  Do you have any of the following symptoms developed in the last 30 days? Fever: No Cough: No Shortness of breath: Yes  When did your symptoms start? 08/07/2018  If the patient has a fever, what is the last reading?  (use n/a if patient denies fever)  N/A . IF THE PATIENT STATES THEY DO NOT OWN A THERMOMETER, THEY MUST GO AND PURCHASE ONE When did the fever start?: N/A Have you taken any medication to suppress a fever (ie Ibuprofen, Aleve, Tylenol)?: N/A  MW, please advise with your recommendations for this pt. Thank you.

## 2018-08-07 NOTE — Telephone Encounter (Signed)
Ok to take now but should be taken 2 pffs each am right p symbicort  first thing in am, not at 10 am unless that's when he wakes up

## 2018-08-12 ENCOUNTER — Other Ambulatory Visit: Payer: Self-pay

## 2018-08-12 DIAGNOSIS — I6523 Occlusion and stenosis of bilateral carotid arteries: Secondary | ICD-10-CM

## 2018-08-12 DIAGNOSIS — I779 Disorder of arteries and arterioles, unspecified: Secondary | ICD-10-CM

## 2018-08-14 ENCOUNTER — Other Ambulatory Visit: Payer: Self-pay

## 2018-08-14 ENCOUNTER — Ambulatory Visit (HOSPITAL_COMMUNITY)
Admission: RE | Admit: 2018-08-14 | Discharge: 2018-08-14 | Disposition: A | Discharge status: Home / Self Care | Payer: Medicare Other | Source: Ambulatory Visit | Attending: Family | Admitting: Family

## 2018-08-14 ENCOUNTER — Ambulatory Visit: Payer: Medicare Other | Admitting: Vascular Surgery

## 2018-08-14 ENCOUNTER — Encounter (HOSPITAL_COMMUNITY): Payer: Self-pay

## 2018-08-14 DIAGNOSIS — I6523 Occlusion and stenosis of bilateral carotid arteries: Secondary | ICD-10-CM

## 2018-08-14 DIAGNOSIS — I779 Disorder of arteries and arterioles, unspecified: Secondary | ICD-10-CM

## 2018-08-19 ENCOUNTER — Other Ambulatory Visit: Payer: Self-pay

## 2018-08-19 MED ORDER — LEVALBUTEROL TARTRATE 45 MCG/ACT IN AERO: 2.0000 | INHALATION_SPRAY | RESPIRATORY_TRACT | 3 refills | Status: DC | PRN

## 2018-08-21 ENCOUNTER — Other Ambulatory Visit: Payer: Medicare Other | Admitting: *Deleted

## 2018-08-21 DIAGNOSIS — Z515 Encounter for palliative care: Secondary | ICD-10-CM

## 2018-08-22 ENCOUNTER — Telehealth: Payer: Self-pay | Admitting: Internal Medicine

## 2018-08-22 ENCOUNTER — Other Ambulatory Visit: Payer: Self-pay

## 2018-08-22 NOTE — Telephone Encounter (Signed)
Left message for patient to call back  

## 2018-08-24 NOTE — Progress Notes (Signed)
COMMUNITY PALLIATIVE CARE RN NOTE  PATIENT NAME: [name] DOB: December 21, 1929 MRN: [id number]  PRIMARY CARE PROVIDER: [name], MD  RESPONSIBLE PARTY: Theodus Ran (wife) Acct ID - Guarantor Home Phone Work Phone Relationship Acct Type  [id number] - [place] [name] [phone]  Self P/F     [place], [name], [place] [address]   Due to the COVID-19 crisis, this virtual check-in visit was done via telephone from my office and it was initiated and consent by this patient and or family.  PLAN OF CARE and INTERVENTION:  1. ADVANCE CARE PLANNING/GOALS OF CARE: Goal is for him to remain at home with his wife. He wants to feel better overall. 2. PATIENT/CAREGIVER EDUCATION: N/A 3. DISEASE STATUS: Virtual check-in visit completed via telephone. Patient denies pain at this time. He states that his biggest issue is his sinus congestion and breathing. He suspects that he may have another sinus infection and is going to reach out to one of his doctors to help. He is currently using saline rinses and Flonase which he says only provide temporary relief. His edema is slight better, but still present. He does take an additional Lasix tablet if necessary, but states that it makes his BP drop and causes increased dizziness. He continues on Oxygen at 2L/min via [place] and uses a Trilogy machine at night. He is using his nebulizers more regularly. He is also taking Xanax several times daily to help with his anxiety and ease breathing. He reports feeling weak and tired daily and spends most of the day sitting. His intake is good and weight has been stable. He remains independent with ADLs, but requires frequent rest periods. Will continue to monitor.  HISTORY OF PRESENT ILLNESS:  This is a 85 yo male who resides at home with his wife. Palliative care team continues to follow patient. Will continue with monthly and prn visits.  CODE STATUS: Full Code ADVANCED DIRECTIVES: N MOST FORM: no PPS:  50%   (Duration of visit and documentation 45 minutes)   [name], RN BSN

## 2018-08-26 MED ORDER — AMOXICILLIN-POT CLAVULANATE 875-125 MG PO TABS: 1.0000 | ORAL_TABLET | Freq: Two times a day (BID) | ORAL | 0 refills | Status: AC

## 2018-08-26 NOTE — Telephone Encounter (Signed)
Be sure following action plan on prednisone dosing  Augmentin 875 mg take one pill twice daily  X 10 days - take at breakfast and supper with large glass of water.  It would help reduce the usual side effects (diarrhea and yeast infections) if you ate cultured yogurt at lunch.

## 2018-08-26 NOTE — Telephone Encounter (Signed)
Called and spoke to patient. Relayed message from Dr. [name]. Patient verbalized understanding. Patient verified that he is taking his prednisone as Dr. [name] advised. Rx for Augmentin sent to patient's pharmacy of choice. Nothing further needed at this time.

## 2018-08-26 NOTE — Telephone Encounter (Signed)
Called and spoke with pt who stated his breathing has been worse at least 2-3 weeks. Pt said he is having to use nebulizer 2-3 times daily as well as having to use his rescue inhaler 2-3 times daily as well.  Pt is using all his meds as prescribed.  Asked pt if he has had to bump his O2 up and pt said that he has kept it at 2L and has been satting between 93-94% but at times he feels like he might need to bump it up due to having problems with his breathing.  While speaking with pt, pt said that he has been having a terrible time with his sinuses and congestion and wanted to know if Rx for amoxicillin might help with this.  Pt has had some tightness in chest but denies any complaints of wheezing.  Pt also denies any complaints of cough and stated that he has been using his flutter valve but has not been able to get any mucus up.

## 2018-09-05 ENCOUNTER — Ambulatory Visit: Payer: Private Health Insurance - Indemnity | Admitting: Neurology

## 2018-09-09 DIAGNOSIS — B353 Tinea pedis: Secondary | ICD-10-CM | POA: Diagnosis not present

## 2018-09-09 DIAGNOSIS — R531 Weakness: Secondary | ICD-10-CM | POA: Diagnosis not present

## 2018-09-12 ENCOUNTER — Other Ambulatory Visit: Payer: Self-pay

## 2018-09-12 ENCOUNTER — Other Ambulatory Visit: Payer: Medicare Other | Admitting: Licensed Clinical Social Worker

## 2018-09-12 DIAGNOSIS — Z515 Encounter for palliative care: Secondary | ICD-10-CM

## 2018-09-12 NOTE — Progress Notes (Signed)
COMMUNITY PALLIATIVE CARE SW NOTE  PATIENT NAME: [name] DOB: October 10, 1929 MRN: [id number]  PRIMARY CARE PROVIDER: [name], MD  RESPONSIBLE PARTY:  Acct ID - Guarantor Home Phone Work Phone Relationship Acct Type  [id number] - Bembenek,Muriel [name] [phone]  Self P/F     [place], [name], [place] [address]  Due to the COVID-19 crisis, this virtual check-in visit was done via telephone from my office and it was initiated and consent given by this patient and or family.   PLAN OF CARE and INTERVENTIONS:             1. GOALS OF CARE/ ADVANCE CARE PLANNING:  Patient's goal is to remain in his home with his wife, [name].  He is a full code. 2. SOCIAL/EMOTIONAL/SPIRITUAL ASSESSMENT/ INTERVENTIONS:  SW conducted a Sales executive visit per [employer] request.  Patient is going to [facility] tomorrow for a consult regarding his Trilogy machine and to address his chronic sinusitis.  She also had questions about a bill she received from [employer].  She will send a copy to SW to investigate.  She is concerned about driving tomorrow, but knows that patient will benefit from the MD appointment. 3. PATIENT/CAREGIVER EDUCATION/ COPING:  Patient and his wife cope by expressing their feelings openly. 4. PERSONAL EMERGENCY PLAN:  Patient rest when he is fatigued.  [name] will contact patient's MD for advice. 5. COMMUNITY RESOURCES COORDINATION/ HEALTH CARE NAVIGATION:  None. 6. FINANCIAL/LEGAL CONCERNS/INTERVENTIONS:  None.     SOCIAL HX:  Social History   Tobacco Use  . Smoking status: Former Smoker    Packs/day: 2.00    Years: 33.00    Pack years: 66.00    Types: Cigarettes    Quit date: 03/25/1982    Years since quitting: 36.4  . Smokeless tobacco: Never Used  Substance Use Topics  . Alcohol use: Yes    Alcohol/week: 0.0 standard drinks    Comment: 1.5-3 oz daily in the past, minimal use in the last few months    CODE STATUS:  Full Code  ADVANCED DIRECTIVES: N MOST FORM  COMPLETE:  N HOSPICE EDUCATION PROVIDED: N PPS:  Patient's appetite is normal.  He can ambulate independently. Duration of visit and documentation:  30 minutes.      Creola Corn Meyer Dockery, LCSW

## 2018-09-13 DIAGNOSIS — I2789 Other specified pulmonary heart diseases: Secondary | ICD-10-CM | POA: Diagnosis not present

## 2018-09-13 DIAGNOSIS — J339 Nasal polyp, unspecified: Secondary | ICD-10-CM | POA: Diagnosis not present

## 2018-09-13 DIAGNOSIS — J449 Chronic obstructive pulmonary disease, unspecified: Secondary | ICD-10-CM | POA: Diagnosis not present

## 2018-09-13 DIAGNOSIS — J329 Chronic sinusitis, unspecified: Secondary | ICD-10-CM | POA: Diagnosis not present

## 2018-09-13 DIAGNOSIS — Z8679 Personal history of other diseases of the circulatory system: Secondary | ICD-10-CM | POA: Diagnosis not present

## 2018-09-18 ENCOUNTER — Ambulatory Visit: Payer: Medicare Other | Admitting: Internal Medicine

## 2018-09-18 DIAGNOSIS — J300 Vasomotor rhinitis: Secondary | ICD-10-CM | POA: Diagnosis not present

## 2018-09-18 DIAGNOSIS — J343 Hypertrophy of nasal turbinates: Secondary | ICD-10-CM | POA: Diagnosis not present

## 2018-09-18 DIAGNOSIS — J342 Deviated nasal septum: Secondary | ICD-10-CM | POA: Diagnosis not present

## 2018-09-20 ENCOUNTER — Other Ambulatory Visit: Payer: Self-pay | Admitting: Internal Medicine

## 2018-09-23 ENCOUNTER — Encounter: Payer: Self-pay | Admitting: Internal Medicine

## 2018-09-23 ENCOUNTER — Other Ambulatory Visit: Payer: Self-pay

## 2018-09-23 ENCOUNTER — Ambulatory Visit (INDEPENDENT_AMBULATORY_CARE_PROVIDER_SITE_OTHER): Payer: Medicare Other | Admitting: Internal Medicine

## 2018-09-23 DIAGNOSIS — J310 Chronic rhinitis: Secondary | ICD-10-CM | POA: Diagnosis not present

## 2018-09-23 DIAGNOSIS — I779 Disorder of arteries and arterioles, unspecified: Secondary | ICD-10-CM

## 2018-09-23 DIAGNOSIS — J9611 Chronic respiratory failure with hypoxia: Secondary | ICD-10-CM

## 2018-09-23 DIAGNOSIS — J9612 Chronic respiratory failure with hypercapnia: Secondary | ICD-10-CM

## 2018-09-23 DIAGNOSIS — J449 Chronic obstructive pulmonary disease, unspecified: Secondary | ICD-10-CM | POA: Diagnosis not present

## 2018-09-23 MED ORDER — BUDESONIDE 0.25 MG/2ML IN SUSP: RESPIRATORY_TRACT | 12 refills | Status: DC

## 2018-09-23 MED ORDER — FORMOTEROL FUMARATE 20 MCG/2ML IN NEBU: 20.0000 ug | INHALATION_SOLUTION | Freq: Two times a day (BID) | RESPIRATORY_TRACT | 11 refills | Status: DC

## 2018-09-23 NOTE — Patient Instructions (Addendum)
Ok to stop symbicort and replace with performist and budesonide when available   See calendar for specific medication instructions and bring it back for each and every office visit for every healthcare provider you see.  Without it,  you may not receive the best quality medical care that we feel you deserve.  You will note that the calendar groups together  your maintenance  medications that are timed at particular times of the day.  Think of this as your checklist for what your doctor has instructed you to do until your next evaluation to see what benefit  there is  to staying on a consistent group of medications intended to keep you well.  The other group at the bottom is entirely up to you to use as you see fit  for specific symptoms that may arise between visits that require you to treat them on an as needed basis.  Think of this as your action plan or "what if" list.   Separating the top medications from the bottom group is fundamental to providing you adequate care going forward.     Please schedule a follow up visit in 3 months but call sooner if needed

## 2018-09-23 NOTE — Progress Notes (Signed)
Subjective:     Patient ID: [name], male    DOB: 1929-12-20   MRN: [id number]   Brief patient profile:  24   yowm MM quit smoking in 1984 with GOLD IV/ 02 dep and steroid dep copd place on NIV by Dr Larkin Ina with profound chronic FTT not improved on NIV    History of Present Illness  05/25/2017  f/u ov/[name] re:  [name] iv copd/ 02 dep  Chief Complaint  Patient presents with  . Follow-up    Increased SOB and  fatigue x last week, 2.5L pulsed, compliant with spiriva, symbicort, and xopenex.   Dyspnea:  At rest/ worse walking 10 ft  Cough: none Sleep: on trilogy ok SABA use:  Confused with saba hfa vs neb  rec Add aldactone 50 mg daily  Continue triegy at your volition Ok to adjust xanax (alprazolam) from one-half to one three times a day to calm your breathing down and relieve anxiety related to breathlessness       09/23/2018  f/u ov/[name] re:  GOLD IV copd/ chronic resp failure, noct NIV dep / pred at 10 mg daily  Chief Complaint  Patient presents with  . Follow-up    F/U per patient for increased SOB and fatigue for the past month. Increased post nasal drip.   Dyspnea:  MMRC4  = sob if tries to leave home or while getting dressed   Cough: no Sleeping: on trilogy flat position  SABA use: about twice a day hfa xoepenex / neb works better and rec by [facility] pulmonary to try perform/bud instead of symbicort  02: 2lpm 24/7    No obvious day to day or daytime variability or assoc excess/ purulent sputum or mucus plugs or hemoptysis or cp or chest tightness, subjective wheeze or overt sinus or hb symptoms.   Sleeping as above without nocturnal  or early am exacerbation  of respiratory  c/o's or need for noct saba. Also denies any obvious fluctuation of symptoms with weather or environmental changes or other aggravating or alleviating factors except as outlined above   No unusual exposure hx or h/o childhood pna/ asthma or knowledge of premature birth.  Current Allergies, Complete  Past Medical History, Past Surgical History, Family History, and Social History were reviewed in [employer] record.  ROS  The following are not active complaints unless bolded Hoarseness, sore throat, dysphagia, dental problems, itching, sneezing,  nasal congestion or discharge of excess mucus or purulent secretions, ear ache,   fever, chills, sweats, unintended wt loss or wt gain, classically pleuritic or exertional cp,  orthopnea pnd or arm/hand swelling  or leg swelling, presyncope, palpitations, abdominal pain, anorexia, nausea, vomiting, diarrhea  or change in bowel habits or change in bladder habits, change in stools or change in urine, dysuria, hematuria,  rash, arthralgias, visual complaints, headache, numbness, weakness or ataxia or problems with walking or coordination,  change in mood or  memory.        Current Meds  Medication Sig  . ALPRAZolam (XANAX) 0.25 MG tablet TAKE 1 TABLET BY MOUTH THREE TIMES DAILY AS NEEDED  . aspirin 81 MG tablet Take 81 mg by mouth daily.  Marland Kitchen atorvastatin (LIPITOR) 20 MG tablet TAKE 1 TABLET BY MOUTH EVERY DAY  . Cholecalciferol (VITAMIN D3) 2000 units TABS Take 2,000 Units by mouth daily.  . finasteride (PROSCAR) 5 MG tablet Take 5 mg by mouth every morning.  . furosemide (LASIX) 20 MG tablet TAKE 1 TABLET BY  MOUTH EVERY DAY. MAY TAKE ADDITIONAL TABLET IF NEEDED FOR SWELLING  . levalbuterol (XOPENEX HFA) 45 MCG/ACT inhaler Inhale 2 puffs into the lungs every 4 (four) hours as needed for wheezing or shortness of breath.  . levalbuterol (XOPENEX) 0.63 MG/3ML nebulizer solution USE 1 VIAL VIA NEBULIZER EVERY 4 HOURS AS NEEDED FOR WHEEZING OR SHORTNESS OF BREATH  . metoprolol succinate (TOPROL-XL) 25 MG 24 hr tablet Take 12.5 mg by mouth daily.  . mometasone (NASONEX) 50 MCG/ACT nasal spray SHAKE LIQUID AND USE 2 SPRAYS IN EACH NOSTRIL DAILY  . Multiple Vitamins-Minerals (PRESERVISION AREDS 2) CAPS Take 1 capsule by mouth daily.  .  nitroGLYCERIN (NITROSTAT) 0.4 MG SL tablet Place 1 tablet (0.4 mg total) under the tongue every 5 (five) minutes as needed for chest pain.  . OXYGEN Inhale 2 L continuous into the lungs.   . pantoprazole (PROTONIX) 40 MG tablet TAKE 1 TABLET(40 MG) BY MOUTH DAILY 30 TO 60 MINUTES BEFORE FIRST MEAL OF THE DAY  . predniSONE (DELTASONE) 10 MG tablet FOR FLARE OF COUGH/WHEEZING MAY INCREASE TO 2 DAILY BETTER, THEN 1 DAILY  . sodium chloride (MURO 128) 2 % ophthalmic solution 1 drop.  . SPIRIVA RESPIMAT 2.5 MCG/ACT AERS Inhale 2 puffs into the lungs daily.  Marland Kitchen spironolactone (ALDACTONE) 25 MG tablet TAKE 1/2 TABLET BY MOUTH EVERY DAY  . SYMBICORT 160-4.5 MCG/ACT inhaler INHALE 2 PUFFS INTO THE LUNGS EVERY MORNING AND ANOTHER 2 PUFFS ABOUT 12 HOURS LATER  . vitamin B-12 (CYANOCOBALAMIN) 100 MCG tablet Take 100 mcg by mouth daily.              Objective:   Physical Exam  Frail elderly hoarse  wm w/c bound    wt 159 04/28/2010  > 10/14/2010 154  > 10/04/2011 150> 12/21/2011  150 > 149 01/29/2012 >154 04/05/2012 > 05/22/2012 148 > 09/30/2012  146 >>144> 10/15/2012 > 12/18/2012  145 > 148 02/14/2013 >148 03/14/2013 > 04/25/2013 148 > 06/19/2013 150 >>07/17/2013 > 08/27/2013 148 > 10/23/2013  148 >153 12/09/2013 > 02/26/2014  153 > 06/01/2014  152 > 157 06/15/2014 > 09/21/2014 146 > 10/19/2014 151 >>148 11/19/14 > 02/24/2015 148 > 06/30/2015    140 >  10/04/2015  136 > 12/20/2015   142 > 06/13/2016  132 > 09/13/2016   133 >  11/07/2016  139 > 01/11/2017   135 > 02/20/2017  135 >  03/22/2017  132 > 05/25/2017 134  > 06/27/2017  135 > 08/28/2017   135 > 09/13/2017   128  > 10/29/2017  129 > 12/10/2017  133 >  09/23/2018  133     Vital signs reviewed - Note on arrival 02 sats  95% on 2.5 lpm pulsed       HEENT : pt wearing mask not removed for exam due to covid - 19 concerns.    NECK :  without JVD/Nodes/TM/ nl carotid upstrokes bilaterally   LUNGS: no acc muscle use,  Mod barrel  contour chest wall with bilateral  Distant bs s audible  wheeze and  without cough on insp or exp maneuvers and mod  Hyperresonant  to  percussion bilaterally     CV:  RRR  no s3 or murmur or increase in P2, and  1+ pitting on R, 2+ on L    ABD:  soft and nontender with pos mid insp Hoover's  in the supine position. No bruits or organomegaly appreciated, bowel sounds nl  MS:     ext warm without  deformities, calf tenderness, cyanosis or clubbing No obvious joint restrictions   SKIN: warm and dry without lesions    NEURO:  alert, approp, nl sensorium with  no motor or cerebellar deficits apparent.

## 2018-09-24 ENCOUNTER — Encounter: Payer: Self-pay | Admitting: Internal Medicine

## 2018-09-24 NOTE — Assessment & Plan Note (Addendum)
Quit smoking 1984/ MM - PFTs  05/04/05 FEV1 36% ratio 28% with 29% improvement after bronchodilators DLCO 48%  - PFT's 03/26/08 30% ratio 34 with 14% improvement after bronchodilators DLC0 38 %  - Referred to rehab 12/19/2012 > completed March 2015  - changed to spiriva respimat 02/14/2013  - started daily prednisone 06/19/13 > improved only a little> tapered off mid July 2015 > ok to change to prn prednisone 08/27/2013  - flared off nasonex and gerd rx 09/21/2014 > resumed - 10/19/2014  p extensive coaching HFA effectiveness =    90%  - changed pred to ceiling of 20 / floor of 5 mg daily as of 10/19/2014 > changed to floor of 10 mg daily 10/04/2015  - PFT's  06/08/2015  FEV1 0.58 (22 % ) ratio 27  p No sign % improvement from saba p symb/spiriva prior to study with DLCO  9/9 % corrects to 27 % for alv volume   - changed pred to ceiling of 20 and floor of 5 mg daily 06/13/16 > increased floor to 10 mg daily effective 02/20/2017  - referred back to rehab 09/13/2016  - try off noct vent and start titrating up xanax 01/11/2017 > no change but then admitted with flu and placed back on vent  - alpha one AT 03/01/17  MM      - 11/09/2017 COPD exacerbation. No improvement with prn zpack. CXR clear. Given course doxy and pred taper. Resp therapy home eval/treat  - Spirometry 09/16/2018  FEV1 0.60  (23%)  Ratio 0.33 at [name] in terms of symptom/risk and laba/lama/ICS  therefore appropriate rx at this point >>>  Reasonable to try bud/performist in place of symbicort as rec by [facility] though historically he has always preferred hfa and if does any better could also replace spiriva with yupelri neb.

## 2018-09-24 NOTE — Assessment & Plan Note (Signed)
Sinus CT 03/03/10 chronic changes only  - rec rechallenge with qnasal 12/21/11  - Trial of 1st gen h1 and hs h2 rec 05/23/2012  - Allergy profile 12/03/2012-negative-IgE 94.8     Food IgE profile-negative - rec rechallnge with 1st gen H1 06/01/14 > could not tolerate either 1st gen H1   nor Zyrtec so recommended go back to Claritin prn 10/19/2014  - reviewed approp use of nasal steroids/ afrin 06/13/2016  - trial of atrovent per allergy rec but had not started as of 10/29/2017 > rec try it and f/u with allergy prn   Seen by [facility] ent > rec astelin trial then atrovent/ reviewed and  Advised on topical rx

## 2018-09-24 NOTE — Assessment & Plan Note (Signed)
Started noct 02 at 2lpm 2008 and on 03/04/09 desat @ > 185 ft so rec wear with activtiy > rm to rm  - 02/15/2011   Walked RA x one lap @ 185 stopped due to  desat > corrected on 2lpm - 02/26/2014  Walked 2lpm  2 laps @ 185 ft each stopped due to  Sob/ sats 88% at nl pace  - 10/19/2014   Walked RA  2 laps @ 185 ft each stopped due to  End of study, slow pace,  sob  But no  desat   05/21/15 Eval by Dr Dedio/ rec trial of NIV  - HCO3  10/27/15 = 34  - HC03  10/06/16  = 28 ? On noct vent (non compliant) - As of admit  02/04/17 back on noct vent/ 02 2lpm 24/7 - HC03 36  08/28/2017 despite reporting using noct vent  - HC03 31  01/04/18  - HC03 32   07/10/18   Adequate control on present rx, reviewed in detail with pt > no change in rx needed     I had an extended discussion with the patient/wife reviewing all relevant studies completed to date and  lasting 15 to 20 minutes of a 25 minute visit    Each maintenance medication was reviewed in detail including most importantly the difference between maintenance and prns and under what circumstances the prns are to be triggered using an action plan format that is not reflected in the computer generated alphabetically organized AVS but trather by a customized med calendar that reflects the AVS meds with confirmed 100% correlation.   In addition, Please see AVS for unique instructions that I personally wrote and verbalized to the the pt in detail and then reviewed with pt  by my nurse highlighting any  changes in therapy recommended at today's visit to their plan of care.

## 2018-10-01 DIAGNOSIS — I509 Heart failure, unspecified: Secondary | ICD-10-CM | POA: Diagnosis not present

## 2018-10-01 DIAGNOSIS — I10 Essential (primary) hypertension: Secondary | ICD-10-CM | POA: Diagnosis not present

## 2018-10-01 DIAGNOSIS — N400 Enlarged prostate without lower urinary tract symptoms: Secondary | ICD-10-CM | POA: Diagnosis not present

## 2018-10-01 DIAGNOSIS — E782 Mixed hyperlipidemia: Secondary | ICD-10-CM | POA: Diagnosis not present

## 2018-10-01 DIAGNOSIS — F324 Major depressive disorder, single episode, in partial remission: Secondary | ICD-10-CM | POA: Diagnosis not present

## 2018-10-01 DIAGNOSIS — J449 Chronic obstructive pulmonary disease, unspecified: Secondary | ICD-10-CM | POA: Diagnosis not present

## 2018-10-04 ENCOUNTER — Telehealth: Payer: Self-pay | Admitting: Internal Medicine

## 2018-10-04 NOTE — Telephone Encounter (Signed)
Called and spoke with pt.  Pt states he just finished taking an inhaler - mouth is dry so it's hard to talk. He states he will call back in a few minutes.

## 2018-10-04 NOTE — Telephone Encounter (Signed)
Called and spoke pt. Pt is questioning if he take his neb meds twice a day or once daily. Advised pt per his medication list in his chart that he is to take both neb meds twice daily, not together. Pt verbalized understanding and denied any further questions or concerns at this time.

## 2018-10-04 NOTE — Telephone Encounter (Signed)
Patient is returning the call. CB is [phone]

## 2018-10-07 ENCOUNTER — Encounter: Payer: Self-pay | Admitting: Podiatry

## 2018-10-07 ENCOUNTER — Encounter: Payer: Self-pay | Admitting: Interventional Cardiology

## 2018-10-07 ENCOUNTER — Ambulatory Visit (INDEPENDENT_AMBULATORY_CARE_PROVIDER_SITE_OTHER): Payer: Medicare Other | Admitting: Podiatry

## 2018-10-07 ENCOUNTER — Ambulatory Visit: Payer: Medicare Other | Admitting: Podiatry

## 2018-10-07 ENCOUNTER — Other Ambulatory Visit: Payer: Self-pay

## 2018-10-07 DIAGNOSIS — B351 Tinea unguium: Secondary | ICD-10-CM | POA: Diagnosis not present

## 2018-10-07 DIAGNOSIS — M79675 Pain in left toe(s): Secondary | ICD-10-CM | POA: Diagnosis not present

## 2018-10-07 DIAGNOSIS — M79674 Pain in right toe(s): Secondary | ICD-10-CM

## 2018-10-07 NOTE — Telephone Encounter (Signed)
error 

## 2018-10-07 NOTE — Patient Instructions (Signed)

## 2018-10-08 DIAGNOSIS — Z8679 Personal history of other diseases of the circulatory system: Secondary | ICD-10-CM | POA: Diagnosis not present

## 2018-10-08 DIAGNOSIS — I2789 Other specified pulmonary heart diseases: Secondary | ICD-10-CM | POA: Diagnosis not present

## 2018-10-09 NOTE — Progress Notes (Signed)
Subjective:  [name] presents to clinic today with cc of  painful, thick, discolored, elongated toenails 1-5 b/l that become tender and cannot cut because of thickness. Pain is aggravated when wearing enclosed shoe gear.  He is accompanied by his wife on today's visit. Both he and his wife relate his rash has improved. He now just has dry skin.   Current Outpatient Medications on File Prior to Visit  Medication Sig Dispense Refill  . ALPRAZolam (XANAX) 0.25 MG tablet TAKE 1 TABLET BY MOUTH THREE TIMES DAILY AS NEEDED 90 tablet 3  . aspirin 81 MG tablet Take 81 mg by mouth daily.    Marland Kitchen atorvastatin (LIPITOR) 20 MG tablet TAKE 1 TABLET BY MOUTH EVERY DAY 90 tablet 2  . azelastine (ASTELIN) 0.1 % nasal spray U 1 SPR IEN BID    . budesonide (PULMICORT) 0.25 MG/2ML nebulizer solution One twice daily with performist 120 mL 12  . Cholecalciferol (VITAMIN D3) 2000 units TABS Take 2,000 Units by mouth daily.    . finasteride (PROSCAR) 5 MG tablet Take 5 mg by mouth every morning.    . formoterol (PERFOROMIST) 20 MCG/2ML nebulizer solution Take 2 mLs (20 mcg total) by nebulization 2 (two) times daily. Use in nebulizer twice daily perfectly regularly 120 mL 11  . furosemide (LASIX) 20 MG tablet TAKE 1 TABLET BY MOUTH EVERY DAY. MAY TAKE ADDITIONAL TABLET IF NEEDED FOR SWELLING 45 tablet 2  . ipratropium (ATROVENT) 0.06 % nasal spray U 2 SPRAYS IEN TID    . isosorbide mononitrate (IMDUR) 30 MG 24 hr tablet     . ketoconazole (NIZORAL) 2 % cream APP EXT AA BID    . levalbuterol (XOPENEX HFA) 45 MCG/ACT inhaler Inhale 2 puffs into the lungs every 4 (four) hours as needed for wheezing or shortness of breath. 15 g 3  . levalbuterol (XOPENEX) 0.63 MG/3ML nebulizer solution USE 1 VIAL VIA NEBULIZER EVERY 4 HOURS AS NEEDED FOR WHEEZING OR SHORTNESS OF BREATH 300 mL 3  . metoprolol succinate (TOPROL-XL) 25 MG 24 hr tablet Take 12.5 mg by mouth daily.    . mometasone (NASONEX) 50 MCG/ACT nasal spray SHAKE  LIQUID AND USE 2 SPRAYS IN EACH NOSTRIL DAILY 17 g 3  . Multiple Vitamins-Minerals (PRESERVISION AREDS 2) CAPS Take 1 capsule by mouth daily.    . nitroGLYCERIN (NITROSTAT) 0.4 MG SL tablet Place 1 tablet (0.4 mg total) under the tongue every 5 (five) minutes as needed for chest pain. 25 tablet 3  . OXYGEN Inhale 2 L continuous into the lungs.     . pantoprazole (PROTONIX) 40 MG tablet TAKE 1 TABLET(40 MG) BY MOUTH DAILY 30 TO 60 MINUTES BEFORE FIRST MEAL OF THE DAY 30 tablet 3  . predniSONE (DELTASONE) 10 MG tablet FOR FLARE OF COUGH/WHEEZING MAY INCREASE TO 2 DAILY BETTER, THEN 1 DAILY 100 tablet 2  . sodium chloride (MURO 128) 2 % ophthalmic solution 1 drop.    . SPIRIVA RESPIMAT 2.5 MCG/ACT AERS INHALE 2 PUFFS BY MOUTH EVERY DAY 4 g 3  . spironolactone (ALDACTONE) 25 MG tablet TAKE 1/2 TABLET BY MOUTH EVERY DAY 45 tablet 3  . SYMBICORT 160-4.5 MCG/ACT inhaler INHALE 2 PUFFS INTO THE LUNGS EVERY MORNING AND ANOTHER 2 PUFFS ABOUT 12 HOURS LATER 10.2 g 11  . vitamin B-12 (CYANOCOBALAMIN) 100 MCG tablet Take 100 mcg by mouth daily.     No current facility-administered medications on file prior to visit.      Allergies  Allergen  Reactions  . Cefdinir Other (See Comments)    Severe Diarrhea  . Nitrofurantoin Swelling    Hand Swelling  . Sulfa Antibiotics Anaphylaxis and Swelling  . Sulfonamide Derivatives Swelling    REACTION: facial/tongue swelling  . Cefdinir Diarrhea    REACTION: diarrhea  . Ciprofloxacin Itching and Rash    Red itchy hands  . Nitrofurantoin Swelling    Swollen hands  . Doxycycline Other (See Comments)    Felt terrible  . Sertraline Other (See Comments)    jittery  . Ciprofloxacin Itching and Rash  . Levaquin [Levofloxacin] Itching and Rash  . Levaquin [Levofloxacin] Itching and Rash     Objective:  Physical Examination:  Vascular Examination: Capillary refill time immediate x 10 digits.  Nonpalpable DP/PT pulses b/l.  Digital hair absent b/l  No  edema noted b/l.  Skin temperature gradient WNL b/l.  Dermatological Examination: Skin thin, shiny and atrophic b/l.  No open wounds b/l.  No interdigital macerations noted b/l.  Elongated, thick, discolored brittle toenails with subungual debris and pain on dorsal palpation of nailbeds 1-5 b/l. There is noted onchyolysis of entire nailplate of the left 4th digit.  The nailbed remains intact. Thee is no erythema, no edema, no drainage, no underlying flocculence.  Resolved rash b/l feet.   Musculoskeletal Examination: Muscle strength 5/5 to all muscle groups b/l  No pain, crepitus or joint discomfort with active/passive ROM.  Neurological Examination: Sensation intact 5/5 b/l with 10 gram monofilament.  Vibratory sensation intact b/l.  Proprioceptive sensation intact b/l.  Assessment: Mycotic nail infection with pain 1-5 b/l  Plan: 1. Toenails 1-5 b/l were debrided in length and girth without iatrogenic laceration.Left 4th digit nailplate debrided. Nailbed cleansed with alcohol. Wife instructed to apply antiobiotic ointment to digit once daily for one week.  For dry skin, he may use Aveeno Lotion which he has at home. 2.  Continue soft, supportive shoe gear daily. 3.  Report any pedal injuries to medical professional. 4.  Follow up 3 months. 5.  Patient/POA to call should there be a question/concern in there interim.

## 2018-10-15 ENCOUNTER — Telehealth: Payer: Self-pay | Admitting: Internal Medicine

## 2018-10-15 NOTE — Progress Notes (Signed)
PATIENT: [name] DOB: [birth year]  REASON FOR VISIT: follow up HISTORY FROM: patient  HISTORY OF PRESENT ILLNESS: Today 10/16/18  [name] is an 85 year old male with history of COPD with significant pulmonary issues, on continuous oxygen.  He returns today for follow-up after virtual visit with Dr. Jannifer Franklin in May 2020.  He continues to report daily frontal headache, trouble concentrating, and feeling out of it.  He was started on gabapentin after last visit, but stopped the medication because he felt his blood pressure was running higher than normal for him.  He continues to complain of tremor in his hands, worse in the left versus the right.  He indicates this is gradually worsened over time.  The tremor is not bothersome at rest, only if he goes to do something.  He feels he has a perception that he is not able to do something because of his tremor, but then he surprises himself and actually is.  An example of this is buttoning his clothing.  He feels that his headache clouds his thought process.  He reports dizziness if he stands too suddenly.  He says that he sleeps too good at night, and wakes up in the morning feeling exhausted.  He says he does use a special device in the evening for his COPD, to blow off CO2.  He feels over the last several months his health has declined.  He has been given a referral for ENT at [facility] for constant sinus drainage.  He has not had any recent falls.  He is in a wheelchair today, but is able to ambulate independently. He does report that he has experienced sensation of an irregular heartbeat, chest pain today, prompting him to take a nitroglycerin tablet.  He does report that he is an anxious person, at times hard for him to discern what is a true physical concern versus anxiety.  He presents today for follow-up accompanied by his wife.  HISTORY 05/23/2018 Dr. Jannifer Franklin: [name] is an 85 year old right-handed white male with a history of COPD with significant  pulmonary issues, on oxygen.  The patient has had an admission to the hospital on 04 April 2017 when he was unable to walk.  He was evaluated for cerebrovascular disease, MRI of the brain at that time was unremarkable, he does have generalized cortical atrophy.  The patient was seen by neurology and was felt to have asterixis related to a metabolic problem, the patient apparent was told that this was due to a medication, Ranexa.  The patient resolved the asterixis within about a half a day.  An EEG study at that time was unremarkable.  The patient claims that since that time he has had "tremors" in both hands, left greater than right.  This has gradually worsened as time has gone on.  The tremors do not affect his handwriting or his ability to feed himself.  He occasionally may have myoclonus, he does not describe any recurring episodes of asterixis since being in the hospital.  He notes that if he holds his hands out and spreads out the fingers, the fingers will seem to move and wiggle on their own.  In general, he feels weak all over, he does note some slight numbness and burning in the feet at times and occasionally in the fingertips.  He is able to ambulate fairly well without assistance, he does not use a cane or a walker.  Sometimes, he will feel dizzy when he stands up too  quickly.  He claims that his memory is good and he does suffer from generalized fatigue.  He sleeps well at night.  The patient comes to this office for an evaluation.  The patient reports a chronic issue with a sensation about the head as if he is wearing a hat that is too tight.  He finds the sensation is uncomfortable for him, he claims that he had a sinus work-up that was unremarkable  REVIEW OF SYSTEMS: Out of a complete 14 system review of symptoms, the patient complains only of the following symptoms, and all other reviewed systems are negative.  Weakness, dizziness, headache  ALLERGIES: Allergies  Allergen Reactions  .  Cefdinir Other (See Comments)    Severe Diarrhea  . Nitrofurantoin Swelling    Hand Swelling  . Sulfa Antibiotics Anaphylaxis and Swelling  . Sulfonamide Derivatives Swelling    REACTION: facial/tongue swelling  . Cefdinir Diarrhea    REACTION: diarrhea  . Ciprofloxacin Itching and Rash    Red itchy hands  . Nitrofurantoin Swelling    Swollen hands  . Doxycycline Other (See Comments)    Felt terrible  . Sertraline Other (See Comments)    jittery  . Ciprofloxacin Itching and Rash  . Levaquin [Levofloxacin] Itching and Rash  . Levaquin [Levofloxacin] Itching and Rash    HOME MEDICATIONS: Outpatient Medications Prior to Visit  Medication Sig Dispense Refill  . ALPRAZolam (XANAX) 0.25 MG tablet TAKE 1 TABLET BY MOUTH THREE TIMES DAILY AS NEEDED 90 tablet 3  . aspirin 81 MG tablet Take 81 mg by mouth daily.    Marland Kitchen atorvastatin (LIPITOR) 20 MG tablet TAKE 1 TABLET BY MOUTH EVERY DAY 90 tablet 2  . azelastine (ASTELIN) 0.1 % nasal spray U 1 SPR IEN BID    . budesonide (PULMICORT) 0.25 MG/2ML nebulizer solution One twice daily with performist 120 mL 12  . Cholecalciferol (VITAMIN D3) 2000 units TABS Take 2,000 Units by mouth daily.    . finasteride (PROSCAR) 5 MG tablet Take 5 mg by mouth every morning.    . formoterol (PERFOROMIST) 20 MCG/2ML nebulizer solution Take 2 mLs (20 mcg total) by nebulization 2 (two) times daily. Use in nebulizer twice daily perfectly regularly 120 mL 11  . furosemide (LASIX) 20 MG tablet TAKE 1 TABLET BY MOUTH EVERY DAY. MAY TAKE ADDITIONAL TABLET IF NEEDED FOR SWELLING 45 tablet 2  . ipratropium (ATROVENT) 0.06 % nasal spray U 2 SPRAYS IEN TID    . ketoconazole (NIZORAL) 2 % cream APP EXT AA BID    . levalbuterol (XOPENEX HFA) 45 MCG/ACT inhaler Inhale 2 puffs into the lungs every 4 (four) hours as needed for wheezing or shortness of breath. 15 g 3  . levalbuterol (XOPENEX) 0.63 MG/3ML nebulizer solution USE 1 VIAL VIA NEBULIZER EVERY 4 HOURS AS NEEDED FOR  WHEEZING OR SHORTNESS OF BREATH 300 mL 3  . metoprolol succinate (TOPROL-XL) 25 MG 24 hr tablet Take 12.5 mg by mouth daily.    . mometasone (NASONEX) 50 MCG/ACT nasal spray SHAKE LIQUID AND USE 2 SPRAYS IN EACH NOSTRIL DAILY 17 g 3  . Multiple Vitamins-Minerals (PRESERVISION AREDS 2) CAPS Take 1 capsule by mouth daily.    . nitroGLYCERIN (NITROSTAT) 0.4 MG SL tablet Place 1 tablet (0.4 mg total) under the tongue every 5 (five) minutes as needed for chest pain. 25 tablet 3  . OXYGEN Inhale 2 L continuous into the lungs.     . pantoprazole (PROTONIX) 40 MG tablet TAKE 1 TABLET(40  MG) BY MOUTH DAILY 30 TO 60 MINUTES BEFORE FIRST MEAL OF THE DAY 30 tablet 3  . predniSONE (DELTASONE) 10 MG tablet FOR FLARE OF COUGH/WHEEZING MAY INCREASE TO 2 DAILY BETTER, THEN 1 DAILY 100 tablet 2  . sodium chloride (MURO 128) 2 % ophthalmic solution 1 drop.    . SPIRIVA RESPIMAT 2.5 MCG/ACT AERS INHALE 2 PUFFS BY MOUTH EVERY DAY 4 g 3  . spironolactone (ALDACTONE) 25 MG tablet TAKE 1/2 TABLET BY MOUTH EVERY DAY 45 tablet 3  . vitamin B-12 (CYANOCOBALAMIN) 100 MCG tablet Take 100 mcg by mouth daily.    . SYMBICORT 160-4.5 MCG/ACT inhaler INHALE 2 PUFFS INTO THE LUNGS EVERY MORNING AND ANOTHER 2 PUFFS ABOUT 12 HOURS LATER 10.2 g 11  . isosorbide mononitrate (IMDUR) 30 MG 24 hr tablet      No facility-administered medications prior to visit.     PAST MEDICAL HISTORY: Past Medical History:  Diagnosis Date  . Arthritis    "generalized" (04/04/2017)  . Asthma   . Basal cell carcinoma    "left ear"  . BPH (benign prostatic hyperplasia)    severe; s/p multiple biopsies  . CAD (coronary artery disease)   . Carotid artery occlusion   . Chronic respiratory failure ([place])   . Chronic rhinitis   . COPD (chronic obstructive pulmonary disease) (H[facility])    2L [place] O2  . Diastolic heart failure ([place]) 2019  . Dilation of biliary tract   . Elevated troponin 03/09/2017  . Gallstones   . GERD (gastroesophageal reflux disease)    . History of blood transfusion    "w/his CABG" (04/04/2017)  . History of kidney stones   . Hyperlipidemia   . Hypertension   . On home oxygen therapy    "~ 24/7" (04/04/2017)  . Peripheral vascular disease ([place])   . Pneumonia 2019  . Syncope and collapse   . Ureteral tumor 08/2015   had endoscopic procedure for evaluation, unable to reach for biopsy    PAST SURGICAL HISTORY: Past Surgical History:  Procedure Laterality Date  . BASAL CELL CARCINOMA EXCISION Left    ear  . CARDIAC CATHETERIZATION     "prior to bypass"  . CATARACT EXTRACTION W/ INTRAOCULAR LENS  IMPLANT, BILATERAL Bilateral   . CORONARY ARTERY BYPASS GRAFT  10/2001   LIMA to LAD, SVG to OM1-2, SVG to RCA and PDA  . LITHOTRIPSY    . PROSTATE BIOPSY  Oct. 2014  . TONSILLECTOMY      FAMILY HISTORY: Family History  Problem Relation Age of Onset  . Heart disease Mother   . Heart disease Father        Before age 70  . Breast cancer Mother   . Cancer Mother        Breast cancer  . Hypertension Mother   . Other Mother        AAA  and   Amputation  . Heart attack Father   . Stroke Brother        x3, still living   . Peripheral vascular disease Brother   . Heart disease Brother   . Hyperlipidemia Brother   . Hypertension Brother   . Heart disease Brother   . Allergies Brother     SOCIAL HISTORY: Social History   Socioeconomic History  . Marital status: Married    Spouse name: Not on file  . Number of children: 2  . Years of education: Not on file  . Highest education level: Not  on file  Occupational History  . 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 Financial resource strain: Not on file  . Food insecurity    Worry: Not on file    Inability: Not on file  . Transportation needs    Medical: Not on file    Non-medical: Not on file  Tobacco Use  . Smoking status: Former Smoker    Packs/day: 2.00    Years: 33.00    Pack years: 66.00    Types:  Cigarettes    Quit date: 03/25/1982    Years since quitting: 36.5  . Smokeless tobacco: Never Used  Substance and Sexual Activity  . Alcohol use: Yes    Alcohol/week: 0.0 standard drinks    Comment: 1.5-3 oz daily in the past, minimal use in the last few months  . Drug use: No  . Sexual activity: Not on file  Lifestyle  . Physical activity    Days per week: Not on file    Minutes per session: Not on file  . Stress: Not on file  Relationships  . Social Herbalist on phone: Not on file    Gets together: Not on file    Attends religious service: Not on file    Active member of club or organization: Not on file    Attends meetings of clubs or organizations: Not on file    Relationship status: Not on file  . Intimate partner violence    Fear of current or ex partner: Not on file    Emotionally abused: Not on file    Physically abused: Not on file    Forced sexual activity: Not on file  Other Topics Concern  . Not on file  Social History Narrative   ** Merged History Encounter **       Pt lives at home with his spouse.          PHYSICAL EXAM  Vitals:   10/16/18 1517  BP: 118/64  Pulse: 84  Temp: 97.7 F (36.5 C)  Height: 5\' 8"  (1.727 m)   Body mass index is 20.22 kg/m.  Generalized: Well developed, in no acute distress, wearing continuous nasal oxygen, is hard of hearing Cardiac: irregular heartbeat Neurological examination  Mentation: Alert oriented to time, place, history taking. Follows all commands speech and language fluent Cranial nerve II-XII: Pupils were equal round reactive to light. Extraocular movements were full, visual field were full on confrontational test. Facial sensation and strength were normal. Head turning and shoulder shrug  were normal and symmetric. Motor: The motor testing reveals 5 over 5 strength of all 4 extremities. Good symmetric motor tone is noted throughout. With hands outstretched no evidence of asterixis was noted.  Sensory: Sensory testing is intact to soft touch on all 4 extremities. No evidence of extinction is noted.  Coordination: Cerebellar testing reveals good finger-nose-finger and heel-to-shin bilaterally.  Mild intention tremor to left hand noted. Gait and station: Seated in wheelchair, able to stand from seated position with pushoff, able to ambulate independently in exam room, carrying oxygen Reflexes: Deep tendon reflexes are symmetric  DIAGNOSTIC DATA (LABS, IMAGING, TESTING) - I reviewed patient records, labs, notes, testing and imaging myself where available.  Lab Results  Component Value Date   WBC 12.9 (H) 12/03/2017   HGB 13.1 12/03/2017   HCT 42.1 12/03/2017   MCV 98.4 12/03/2017   PLT 241 12/03/2017      Component Value Date/Time  NA 137 07/10/2018 1604   K 4.1 07/10/2018 1604   CL 94 (L) 07/10/2018 1604   CO2 32 (H) 07/10/2018 1604   GLUCOSE 148 (H) 07/10/2018 1604   GLUCOSE 112 (H) 03/01/2018 1526   BUN 17 07/10/2018 1604   CREATININE 1.11 07/10/2018 1604   CALCIUM 9.3 07/10/2018 1604   PROT 6.0 (L) 04/04/2017 0056   ALBUMIN 4.0 04/04/2017 0056   AST 27 04/04/2017 0056   ALT 20 04/04/2017 0056   ALKPHOS 61 04/04/2017 0056   BILITOT 0.8 04/04/2017 0056   GFRNONAA 60 07/10/2018 1604   GFRAA 69 07/10/2018 1604   Lab Results  Component Value Date   CHOL 146 03/26/2017   HDL 79 03/26/2017   LDLCALC 60 03/26/2017   TRIG 34 03/26/2017   CHOLHDL 1.8 03/26/2017   Lab Results  Component Value Date   HGBA1C (H) 02/23/2010    5.7 (NOTE)                                                                       According to the ADA Clinical Practice Recommendations for 2011, when HbA1c is used as a screening test:   >=6.5%   Diagnostic of Diabetes Mellitus           (if abnormal result  is confirmed)  5.7-6.4%   Increased risk of developing Diabetes Mellitus  References:Diagnosis and Classification of Diabetes Mellitus,Diabetes [id number] 1):S62-S69 and Standards  of Medical Care in         Diabetes - 2011,Diabetes Care,2011,34  (Suppl 1):S11-S61.   Lab Results  Component Value Date   VITAMINB12 2,932 (H) 12/03/2017   Lab Results  Component Value Date   TSH 2.212 12/03/2017    ASSESSMENT AND PLAN 85 y.o. year old male  has a past medical history of Arthritis, Asthma, Basal cell carcinoma, BPH (benign prostatic hyperplasia), CAD (coronary artery disease), Carotid artery occlusion, Chronic respiratory failure ([place]), Chronic rhinitis, COPD (chronic obstructive pulmonary disease) ([place]), Diastolic heart failure ([place]) (2019), Dilation of biliary tract, Elevated troponin (03/09/2017), Gallstones, GERD (gastroesophageal reflux disease), History of blood transfusion, History of kidney stones, Hyperlipidemia, Hypertension, On home oxygen therapy, Peripheral vascular disease ([place]), Pneumonia (2019), Syncope and collapse, and Ureteral tumor (08/2015). here with:  1.  Muscle tension headache -Retry gabapentin 100 mg at bedtime x 2 weeks, then try 100 mg twice a day for headache 2.  History of asterixis and myoclonus -Dr. 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 Report of irregular heart beat, chest pain today -Irregular heart beat noted on exam, placed on cardiac monitor, no AFIB noted, some irregularity, PACS, along with RBBB -Patient reports CP today, had to take a nitro, I have asked him to contact his primary care doctor or cardiology to discuss symptoms 4. Fatigue -Significant pulmonary issues, on oxygen -Admits to being anxious, nervous, is considering starting Zoloft  He will return in 3 months to see Dr. Jannifer Franklin for follow-up. He saw Dr. Jannifer Franklin for VV in May 2020.   I spent 25 minutes with the  patient. 50% of this time was spent  discussing his plan of care.    Butler Denmark, AGNP-C, DNP 10/16/2018, 3:22 PM [facility] [address], [place], [place] [address] [phone]

## 2018-10-15 NOTE — Telephone Encounter (Signed)
Called and spoke with Patient. Patient requested high dose flu vaccine for next week. Patient scheduled 10/22/18, at 4pm. Nothing further at this time.

## 2018-10-16 ENCOUNTER — Ambulatory Visit (INDEPENDENT_AMBULATORY_CARE_PROVIDER_SITE_OTHER): Payer: Medicare Other | Admitting: Neurology

## 2018-10-16 ENCOUNTER — Encounter: Payer: Self-pay | Admitting: Neurology

## 2018-10-16 ENCOUNTER — Other Ambulatory Visit: Payer: Self-pay

## 2018-10-16 VITALS — BP 118/64 | HR 84 | Temp 97.7°F | Ht 68.0 in

## 2018-10-16 DIAGNOSIS — I779 Disorder of arteries and arterioles, unspecified: Secondary | ICD-10-CM

## 2018-10-16 DIAGNOSIS — R531 Weakness: Secondary | ICD-10-CM | POA: Diagnosis not present

## 2018-10-16 DIAGNOSIS — R519 Headache, unspecified: Secondary | ICD-10-CM

## 2018-10-16 MED ORDER — GABAPENTIN 100 MG PO CAPS: ORAL_CAPSULE | ORAL | 3 refills | Status: DC

## 2018-10-16 NOTE — Patient Instructions (Addendum)
1. Retry gabapentin 100 mg at bedtime x 2 weeks, then take 1 twice a day 2. Please discuss with your primary care doctor or cardiology about irregular heart rate, palpations

## 2018-10-17 DIAGNOSIS — J3089 Other allergic rhinitis: Secondary | ICD-10-CM | POA: Diagnosis not present

## 2018-10-17 DIAGNOSIS — J449 Chronic obstructive pulmonary disease, unspecified: Secondary | ICD-10-CM | POA: Diagnosis not present

## 2018-10-17 DIAGNOSIS — K219 Gastro-esophageal reflux disease without esophagitis: Secondary | ICD-10-CM | POA: Diagnosis not present

## 2018-10-17 NOTE — Progress Notes (Signed)
I have read the note, and I agree with the clinical assessment and plan.  Roya Gieselman K Raneisha Bress   

## 2018-10-21 ENCOUNTER — Telehealth: Payer: Self-pay | Admitting: Interventional Cardiology

## 2018-10-21 NOTE — Progress Notes (Signed)
CARDIOLOGY OFFICE NOTE  Date:  10/22/2018    [name] Date of Birth: [birth year] Medical Record [id number]  PCP:  [name], MD  Cardiologist:  Tamala Julian  Chief Complaint  Patient presents with  . Irregular Heart Beat    Work in visit - seen for Dr. Tamala Julian    History of Present Illness: [name] is Travis 85 y.o. male who presents today for Travis work in visit. Seen for Dr. Tamala Julian.   He is Travis frail male with multiple issues. He has Travis history of CAD with prior coronary bypass grafting, COPD with cor pulmonaleon chronic 2 L oxygen and nocturnal ventilation, hyperlipidemia, hypertension, PAD, carotid obstructive disease followed by Dr. Scot Dock, chronic diastolic heart failurewith LE edema and fatigue.  Last echo 03/09/17 with EF 60-65%, G1DD, telemetry monitor in June 2019 with SR to ST with PACs and PVCs. No Travis fib or tachycardia, sustained.  He was doing well on cardiac stand point when last seen by Dr. Tamala Julian 03/2018.  Presented back in early July and saw [name] with worsening lower extremity edema and fatigue along with chest pain - had used some NTG. No longer able to walk his 1 mile 2 times Travis week due to progressive leg pain. Intermittent heart racing noted as well. He has had dizziness when BP is low - difficult to treat edema due to his hypotension. He has had Imdur and Toprol added.   Then seen by [name] in follow up at the end of July - noted to be on palliative care. Pretty fragile. He had not increased his meds or started Imdur due to fear of hypotension. Noted prior discussions from Dr. Tamala Julian that there was not much to offer overall.   Phone call yesterday - concern for "eratic heart beat" - thus added to my schedule for today.   The patient does not have symptoms concerning for COVID-19 infection (fever, chills, cough, or new shortness of breath).   Comes in today. Here with his wife. Lots of concern. He notes that he continues to feel poorly in general. Worse over  the past month or so. More fatigue.  Has had more "eratic" heart beating - especially with his new pulmonary medicines. He was seen in neuro office last Wednesday - there was concern about his heart rate - but no AF. They brought me the EKG - this was sinus tach with PACs and RBBB noted from October 7.  Has been sent down to [facility] - was to go for his sinuses - but actually saw pulmonary - concern that he was having more heart failure and he had an echo - LV function was normal but there was mention of "AF with RVR" - no actual EKG strips noted however. He notes an irregular pulse when he checks it manually. He is dizzy as Travis general rule. No falls but getting more unsteady. Chronic leg pain - apparently never got to see [facility] following his doppler study done at [facility] - Dr. Scot Dock - asking today what could be done for his legs. Wanting to know what to do for his atrial fib. He typically restricts his salt. He continues to have issues with low BP and has dizziness.   Past Medical History:  Diagnosis Date  . Arthritis    "generalized" (04/04/2017)  . Asthma   . Basal cell carcinoma    "left ear"  . BPH (benign prostatic hyperplasia)    severe; s/p multiple biopsies  .  CAD (coronary artery disease)   . Carotid artery occlusion   . Chronic respiratory failure ([place])   . Chronic rhinitis   . COPD (chronic obstructive pulmonary disease) (HCC)    2L [place] O2  . Diastolic heart failure ([place]) 2019  . Dilation of biliary tract   . Elevated troponin 03/09/2017  . Gallstones   . GERD (gastroesophageal reflux disease)   . History of blood transfusion    "w/his CABG" (04/04/2017)  . History of kidney stones   . Hyperlipidemia   . Hypertension   . On home oxygen therapy    "~ 24/7" (04/04/2017)  . Peripheral vascular disease ([place])   . Pneumonia 2019  . Syncope and collapse   . Ureteral tumor 08/2015   had endoscopic procedure for evaluation, unable to reach for biopsy    Past Surgical History:   Procedure Laterality Date  . BASAL CELL CARCINOMA EXCISION Left    ear  . CARDIAC CATHETERIZATION     "prior to bypass"  . CATARACT EXTRACTION W/ INTRAOCULAR LENS  IMPLANT, BILATERAL Bilateral   . CORONARY ARTERY BYPASS GRAFT  10/2001   LIMA to LAD, SVG to OM1-2, SVG to RCA and PDA  . LITHOTRIPSY    . PROSTATE BIOPSY  Oct. 2014  . TONSILLECTOMY       Medications: Current Meds  Medication Sig  . ALPRAZolam (XANAX) 0.25 MG tablet TAKE 1 TABLET BY MOUTH THREE TIMES DAILY AS NEEDED  . aspirin 81 MG tablet Take 81 mg by mouth daily.  Marland Kitchen atorvastatin (LIPITOR) 20 MG tablet TAKE 1 TABLET BY MOUTH EVERY DAY  . azelastine (ASTELIN) 0.1 % nasal spray U 1 SPR IEN BID  . budesonide (PULMICORT) 0.25 MG/2ML nebulizer solution One twice daily with performist  . Cholecalciferol (VITAMIN D3) 2000 units TABS Take 2,000 Units by mouth daily.  . finasteride (PROSCAR) 5 MG tablet Take 5 mg by mouth every morning.  . formoterol (PERFOROMIST) 20 MCG/2ML nebulizer solution Take 2 mLs (20 mcg total) by nebulization 2 (two) times daily. Use in nebulizer twice daily perfectly regularly  . furosemide (LASIX) 20 MG tablet TAKE 1 TABLET BY MOUTH EVERY DAY. MAY TAKE ADDITIONAL TABLET IF NEEDED FOR SWELLING  . levalbuterol (XOPENEX HFA) 45 MCG/ACT inhaler Inhale 2 puffs into the lungs every 4 (four) hours as needed for wheezing or shortness of breath.  . levalbuterol (XOPENEX) 0.63 MG/3ML nebulizer solution USE 1 VIAL VIA NEBULIZER EVERY 4 HOURS AS NEEDED FOR WHEEZING OR SHORTNESS OF BREATH  . metoprolol succinate (TOPROL-XL) 25 MG 24 hr tablet Take 1 tablet (25 mg total) by mouth daily.  . mometasone (NASONEX) 50 MCG/ACT nasal spray SHAKE LIQUID AND USE 2 SPRAYS IN EACH NOSTRIL DAILY  . Multiple Vitamins-Minerals (PRESERVISION AREDS 2) CAPS Take 1 capsule by mouth daily.  . nitroGLYCERIN (NITROSTAT) 0.4 MG SL tablet Place 1 tablet (0.4 mg total) under the tongue every 5 (five) minutes as needed for chest pain.  .  OXYGEN Inhale 2 L continuous into the lungs.   . predniSONE (DELTASONE) 10 MG tablet FOR FLARE OF COUGH/WHEEZING MAY INCREASE TO 2 DAILY BETTER, THEN 1 DAILY  . sodium chloride (MURO 128) 2 % ophthalmic solution 1 drop.  . SPIRIVA RESPIMAT 2.5 MCG/ACT AERS INHALE 2 PUFFS BY MOUTH EVERY DAY  . spironolactone (ALDACTONE) 25 MG tablet TAKE 1/2 TABLET BY MOUTH EVERY DAY  . vitamin B-12 (CYANOCOBALAMIN) 100 MCG tablet Take 100 mcg by mouth daily.  . [DISCONTINUED] metoprolol succinate (TOPROL-XL) 25 MG  24 hr tablet Take 12.5 mg by mouth daily.     Allergies: Allergies  Allergen Reactions  . Cefdinir Other (See Comments)    Severe Diarrhea  . Nitrofurantoin Swelling    Hand Swelling  . Sulfa Antibiotics Anaphylaxis and Swelling  . Sulfonamide Derivatives Swelling    REACTION: facial/tongue swelling  . Cefdinir Diarrhea    REACTION: diarrhea  . Ciprofloxacin Itching and Rash    Red itchy hands  . Nitrofurantoin Swelling    Swollen hands  . Doxycycline Other (See Comments)    Felt terrible  . Sertraline Other (See Comments)    jittery  . Ciprofloxacin Itching and Rash  . Levaquin [Levofloxacin] Itching and Rash  . Levaquin [Levofloxacin] Itching and Rash    Social History: The patient  reports that he quit smoking about 36 years ago. His smoking use included cigarettes. He has Travis 66.00 pack-year smoking history. He has never used smokeless tobacco. He reports current alcohol use. He reports that he does not use drugs.   Family History: The patient's family history includes Allergies in his brother; Breast cancer in his mother; Cancer in his mother; Heart attack in his father; Heart disease in his brother, brother, father, and mother; Hyperlipidemia in his brother; Hypertension in his brother and mother; Other in his mother; Peripheral vascular disease in his brother; Stroke in his brother.   Review of Systems: Please see the history of present illness.   All other systems are  reviewed and negative.   Physical Exam: VS:  BP 122/64   Pulse 72   Ht 5\' 9"  (1.753 m)   Wt 135 lb (61.2 kg)   SpO2 97%   BMI 19.94 kg/m  .  BMI Body mass index is 19.94 kg/m.  Wt Readings from Last 3 Encounters:  10/22/18 135 lb (61.2 kg)  09/23/18 133 lb (60.3 kg)  08/05/18 136 lb 6.4 oz (61.9 kg)    General: Chronically ill appearing. Looks rather frail. Alert and in no acute distress. Has oxygen in place.   HEENT: Normal.  Neck: Supple, no JVD, carotid bruits, or masses noted.  Cardiac: Regular rate and rhythm. Frequent ectopics. Heart tones are distant.  Trace edema.  Respiratory:  Decreased breath sounds noted.   GI: Soft and nontender.  MS: No deformity or atrophy. Gait and ROM intact but has Travis walker.  Skin: Warm and dry. Color is normal.  Neuro:  Strength and sensation are intact and no gross focal deficits noted.  Psych: Alert, appropriate and with normal affect.   LABORATORY DATA:  EKG:  EKG is ordered today. This demonstrates NSR with PACs and RBBB. Reviewed with Dr. Tamala Julian today.   Lab Results  Component Value Date   WBC 12.9 (H) 12/03/2017   HGB 13.1 12/03/2017   HCT 42.1 12/03/2017   PLT 241 12/03/2017   GLUCOSE 148 (H) 07/10/2018   CHOL 146 03/26/2017   TRIG 34 03/26/2017   HDL 79 03/26/2017   LDLCALC 60 03/26/2017   ALT 20 04/04/2017   AST 27 04/04/2017   NA 137 07/10/2018   K 4.1 07/10/2018   CL 94 (L) 07/10/2018   CREATININE 1.11 07/10/2018   BUN 17 07/10/2018   CO2 32 (H) 07/10/2018   TSH 2.212 12/03/2017   INR 1.06 04/04/2017   HGBA1C (H) 02/23/2010    5.7 (NOTE)  According to the ADA Clinical Practice Recommendations for 2011, when HbA1c is used as Travis screening test:   >=6.5%   Diagnostic of Diabetes Mellitus           (if abnormal result  is confirmed)  5.7-6.4%   Increased risk of developing Diabetes Mellitus  References:Diagnosis and Classification of Diabetes  [place],Diabetes [id number] 1):S62-S69 and Standards of Medical Care in         Diabetes - 2011,Diabetes [id number]  (Suppl 1):S11-S61.       BNP (last 3 results) Recent Labs    12/03/17 1628 12/03/17 2029  BNP 73.3 104.6*    ProBNP (last 3 results) Recent Labs    11/09/17 1618 01/04/18 1547 07/10/18 1604  PROBNP 100.0 460 648*     Other Studies Reviewed Today:        ABI 07/2018 Summary: Right: Resting right ankle-brachial index indicates noncompressible right lower extremity arteries. Unable to obtain toe pressure due to flat PPG signal. Tibial waveform indicates severe peripheral arterial disease. Left: Resting left ankle-brachial index indicates noncompressible left lower extremity arteries.The left toe-brachial index is abnormal.  Tibial waveform indicates severe peripheral arterial disease.  Echo 3.1.29 Study Conclusions  - Left ventricle: The cavity size was normal. Systolic function was normal. The estimated ejection fraction was in the range of 60% to 65%. Wall motion was normal; there were no regional wall motion abnormalities. Doppler parameters are consistent with abnormal left ventricular relaxation (grade 1 diastolic dysfunction). - Aortic valve: Transvalvular velocity was within the normal range. There was no stenosis. There was no regurgitation. - Mitral valve: Transvalvular velocity was within the normal range. There was no evidence for stenosis. There was trivial regurgitation. - Right ventricle: The cavity size was normal. Wall thickness was normal. Systolic function was reduced. - Tricuspid valve: There was mild regurgitation. - Pulmonary arteries: Systolic pressure was within the normal range. PA peak pressure: 24 mm Hg (S). - Pericardium, extracardiac: Travis trivial pericardial effusion was identified.   ASSESSMENT AND PLAN:  1.  "Eratic heart rate" - suspected this is tachycardia with PACs -  while there is mention of AF with RVR - we have no formal EKG documentation to verify. Discussed with Dr. Tamala Julian - will arrange for event monitor. Will try to increase the Toprol to Travis full tablet as tried previously - he can use more salt to help offset hypotension. He does not appear to be Travis good candidate for anticoagulation from our standpoint given general frailty, dizziness and high risk for falls.   2. Chest pain - known CAD with prior CABG - not really endorsed today.   3. End stage COPD with asthma - Has 02 24/7 and trilogy at night - worsening dyspnea - he has Travis normal EF - his shortness of breath seems more related to his lung issues at this time.   4. Palliative care - unclear to me how much of Travis role they play  5.  PAD with known abnormal ABI's - he has not been able to get an appointment with Dr. Scot Dock - he would be at high risk for any procedure from our standpoint.   6. Chronic lower extremity edema - has not been able to increase Lasix due to hypotension. This may be worsened if we have to use more salt to keep his BP up - may need to consider Midodrine/Florinef.   7. COVID-19 Education: The signs and symptoms of COVID-19 were discussed with the patient and how to seek care  for testing (follow up with PCP or arrange E-visit).  The importance of social distancing, staying at home, hand hygiene and wearing Travis mask when out in public were discussed today.  Current medicines are reviewed with the patient today.  The patient does not have concerns regarding medicines other than what has been noted above.  The following changes have been made:  See above.  Labs/ tests ordered today include:    Orders Placed This Encounter  Procedures  . Cardiac event monitor  . EKG 12-Lead     Disposition:   FU with Dr. Tamala Julian as planned later this month. Overall prognosis remains quite tenuous.   Patient is agreeable to this plan and will call if any problems develop in the interim.    SignedTruitt Merle, NP  10/22/2018 12:27 PM  [facility] [address] [place], [place]  [address] Phone: [phone] Fax: [phone]

## 2018-10-21 NOTE — Telephone Encounter (Signed)
Appt made with [name] NP for 10/22/18 at 10:30 am... pts wife advised the strict rules about only the pt being able to come upstairs.. she says she is fine but he struggles without help with ambulation.. I advised her to be prepared to stay downstairs and we will assess him when they arrive in the morning but to please come a bit ealry so we can be sure he is ready for Cecille Rubin on time for his visit. She verbalized understanding.       COVID-19 Pre-Screening Questions:  . In the past 7 to 10 days have you had a cough,  shortness of breath, headache, congestion, fever (100 or greater) body aches, chills, sore throat, or sudden loss of taste or sense of smell? NO . Have you been around anyone with known Covid 19. NO . Have you been around anyone who is awaiting Covid 19 test results in the past 7 to 10 days? NO . Have you been around anyone who has been exposed to Covid 19, or has mentioned symptoms of Covid 19 within the past 7 to 10 days? NO  If you have any concerns/questions about symptoms patients report during screening (either on the phone or at threshold). Contact the provider seeing the patient or DOD for further guidance.  If neither are available contact a member of the leadership team.

## 2018-10-21 NOTE — Telephone Encounter (Addendum)
Spoke with the pts wife on DPR and she is calling to report the pt has been having episodes of "erratic" heart beat... she says he was seeing the Neurologist last week and they advised him that his heart beat was irregular and may need to go to the ER but while he was there in the office it improved.   She says he had another episode this past Friday and another last night.. both times lasting an hour and he had dizziness associated with it. He is sleeping now but his BP earlier this morning was 130/60 and HR 90.Marland Kitchen   He had EKG at [facility] last week when seeing Pulmonary MD and she read the EKG to me as being sinus Tachycardia according to his wife.   Pt has been very lethargic and has continued to have bilateral ankle edema sometimes up to his knees.   Pt has an appt with Dr. Tamala Julian 11/07/18.   I advised her that he needs to be seen and she may need to consider calling EMS if his heart seems out of rhythm again and he is symptomatic so they can place him on a monitor and check his VS..She agreed.   Will talk with Dr. Tamala Julian APP for advice on plan for the pt prior to his 11/07/18 appt.

## 2018-10-21 NOTE — Telephone Encounter (Signed)
° ° °  Spouse calling to report dizziness, weakness, SOB due to COPD wears 2L o2 BP 130/60 HR 90 No chest pain  Declined 8/13 appt with Servando Snare

## 2018-10-22 ENCOUNTER — Ambulatory Visit (INDEPENDENT_AMBULATORY_CARE_PROVIDER_SITE_OTHER): Payer: Medicare Other | Admitting: Nurse Practitioner

## 2018-10-22 ENCOUNTER — Ambulatory Visit (INDEPENDENT_AMBULATORY_CARE_PROVIDER_SITE_OTHER): Payer: Medicare Other

## 2018-10-22 ENCOUNTER — Telehealth: Payer: Self-pay | Admitting: *Deleted

## 2018-10-22 ENCOUNTER — Encounter: Payer: Self-pay | Admitting: Nurse Practitioner

## 2018-10-22 ENCOUNTER — Other Ambulatory Visit: Payer: Self-pay

## 2018-10-22 VITALS — BP 122/64 | HR 72 | Ht 69.0 in | Wt 135.0 lb

## 2018-10-22 DIAGNOSIS — I2781 Cor pulmonale (chronic): Secondary | ICD-10-CM | POA: Diagnosis not present

## 2018-10-22 DIAGNOSIS — I5032 Chronic diastolic (congestive) heart failure: Secondary | ICD-10-CM

## 2018-10-22 DIAGNOSIS — I479 Paroxysmal tachycardia, unspecified: Secondary | ICD-10-CM | POA: Diagnosis not present

## 2018-10-22 DIAGNOSIS — J449 Chronic obstructive pulmonary disease, unspecified: Secondary | ICD-10-CM

## 2018-10-22 DIAGNOSIS — I779 Disorder of arteries and arterioles, unspecified: Secondary | ICD-10-CM | POA: Diagnosis not present

## 2018-10-22 DIAGNOSIS — I2510 Atherosclerotic heart disease of native coronary artery without angina pectoris: Secondary | ICD-10-CM | POA: Diagnosis not present

## 2018-10-22 DIAGNOSIS — I10 Essential (primary) hypertension: Secondary | ICD-10-CM | POA: Diagnosis not present

## 2018-10-22 DIAGNOSIS — Z23 Encounter for immunization: Secondary | ICD-10-CM

## 2018-10-22 MED ORDER — METOPROLOL SUCCINATE ER 25 MG PO TB24: 25.0000 mg | ORAL_TABLET | Freq: Every day | ORAL | 1 refills | Status: DC

## 2018-10-22 NOTE — Patient Instructions (Addendum)
After Visit Summary:  We will be checking the following labs today - NONE   Medication Instructions:    Continue with your current medicines. BUT  Increase the Toprol to a whole tablet    If you need a refill on your cardiac medications before your next appointment, please call your pharmacy.     Testing/Procedures To Be Arranged:  Event monitor - looking for AF  Follow-Up:   See Dr. Tamala Julian as planned.    At [facility], you and your health needs are our priority.  As part of our continuing mission to provide you with exceptional heart care, we have created designated Provider Care Teams.  These Care Teams include your primary Cardiologist (physician) and Advanced Practice Providers (APPs -  Physician Assistants and Nurse Practitioners) who all work together to provide you with the care you need, when you need it.  Special Instructions:  . Stay safe, stay home, wash your hands for at least 20 seconds and wear a mask when out in public.  . It was good to talk with you today.    Call the [place] office at [phone] if you have any questions, problems or concerns.

## 2018-10-22 NOTE — Telephone Encounter (Signed)
[employer] to ship a 30 day cardiac event monitor to his home.  Instructions included in monitor kit.  Patient to call [employer] to confirm transmission, send baseline recording.  They will answer any questions regarding monitor instructions as that time.

## 2018-10-24 ENCOUNTER — Other Ambulatory Visit: Payer: Medicare Other | Admitting: *Deleted

## 2018-10-24 ENCOUNTER — Other Ambulatory Visit: Payer: Self-pay

## 2018-10-24 DIAGNOSIS — Z515 Encounter for palliative care: Secondary | ICD-10-CM

## 2018-10-30 NOTE — Addendum Note (Signed)
Addended by: Conan Bowens on: 10/30/2018 09:18 PM   Modules accepted: Level of Service

## 2018-10-30 NOTE — Addendum Note (Signed)
Addended by: Conan Bowens on: 10/30/2018 09:33 PM   Modules accepted: Level of Service

## 2018-10-30 NOTE — Progress Notes (Signed)
COMMUNITY PALLIATIVE CARE RN NOTE  PATIENT NAME: [name] DOB: 01/30/1929 MRN: [id number]  PRIMARY CARE PROVIDER: [name], MD  RESPONSIBLE PARTY: Era Reaume (wife) Acct ID - Guarantor Home Phone Work Phone Relationship Acct Type  [id number] - [place] [name] [phone]  Self P/F     [place], [name], [place] [address]   Due to the COVID-19 crisis, this virtual check-in visit was done via telephone from my office and it was initiated and consent by this patient and or family.  PLAN OF CARE and INTERVENTION:  1. ADVANCE CARE PLANNING/GOALS OF CARE: Goal is for patient to remain at home with his wife. He is a Full Code. 2. PATIENT/CAREGIVER EDUCATION: Dyspnea Management and Safe Mobility 3. DISEASE STATUS: Virtual check-in visit completed via telephone. Patient reports that he has been having issues with leg pain making ambulation more difficult. He is not wanting to perform his exercises or walk much in his home d/t this pain.  He has an appointment with the Vein and Vascular specialist this month. He also reports occasional chest pain, usually relieved with Nitroglycerin. He has required Nitroglycerin about 4 times over the past month. He speaks of his recent issues with an "erratic" heart rate. He has to wear a Holter monitor for a month. He had a recent appointment at [facility] where he says it was suggested Symbicort be changed to a nebulizer vs inhaler. He states that around the 5th day of using this medication in nebulized form, he began having issues with his heart rate. He is unsure as to whether this was just coincidental. He is now back taking Symbicort from his inhaler. He reports feeling tired all of the time, along with intermittent dizziness. He reports his lower extremity edema is worse, but at times he is unable to take an additional Lasix as it lowers his blood pressure. He continues with Xanax 0.25mg , usually 1/2 tablet four times a day. At times he will take a whole  tablet. He continues with head congestion. He has been using a humidifier with his Trilogy machine at night, and feels this helps some. It was recommended that he try Astelin nasal spray, but he is reluctant to try this. He continues to use saline rinses and Flonase mainly. He remains on O2 at 2L/min via [place]. He reports having an appointment with his Neurologist last week. It has been recommended in the past and recently that patient try Neurontin 100 mg, 1 tab at bedtime x 1 week, then increase to twice daily for his headaches. He says that he will start tonight, however patient has a history of medications being recommended, taking 1-2 doses and then discontinue stating that any new medications makes him feel more tired and sleepy. His appetite is good and weight has been stable. He wishes to continue virtual/telephone visits with Palliative care vs in-person visits due to Covid-19 Pandemic. Will continue to monitor.  HISTORY OF PRESENT ILLNESS: This is a 85 yo male who resides at home with his wife. Palliative care team continues to follow patient. Will continue visits monthly and PRN.   CODE STATUS: Full Code ADVANCED DIRECTIVES: Y MOST FORM: no PPS: 50%   (Duration of visit and documentation 60 minutes)   [name], RN BSN

## 2018-11-02 ENCOUNTER — Encounter: Payer: Self-pay | Admitting: Interventional Cardiology

## 2018-11-02 ENCOUNTER — Ambulatory Visit (INDEPENDENT_AMBULATORY_CARE_PROVIDER_SITE_OTHER): Payer: Medicare Other

## 2018-11-02 DIAGNOSIS — J449 Chronic obstructive pulmonary disease, unspecified: Secondary | ICD-10-CM

## 2018-11-02 DIAGNOSIS — I5032 Chronic diastolic (congestive) heart failure: Secondary | ICD-10-CM | POA: Diagnosis not present

## 2018-11-02 DIAGNOSIS — I10 Essential (primary) hypertension: Secondary | ICD-10-CM | POA: Diagnosis not present

## 2018-11-02 DIAGNOSIS — I479 Paroxysmal tachycardia, unspecified: Secondary | ICD-10-CM

## 2018-11-02 DIAGNOSIS — R42 Dizziness and giddiness: Secondary | ICD-10-CM | POA: Diagnosis not present

## 2018-11-02 DIAGNOSIS — I2781 Cor pulmonale (chronic): Secondary | ICD-10-CM | POA: Diagnosis not present

## 2018-11-02 DIAGNOSIS — I2510 Atherosclerotic heart disease of native coronary artery without angina pectoris: Secondary | ICD-10-CM | POA: Diagnosis not present

## 2018-11-05 ENCOUNTER — Telehealth: Payer: Self-pay | Admitting: *Deleted

## 2018-11-05 NOTE — Telephone Encounter (Signed)
[employer] Monitor Report  Day 3 - Critical Recorded 11/04/18 11:09 am CST Patient reported Passed out, dizziness Recording is SR w/ PAC's, HR 67.  Spoke to patient. He did not pass out.  He accidentally pressed that button. He experienced lightheadedness and fatigue at that time. After a short period the dizziness went back to his baseline.  Monitor report signed by DOD, Dr. Acie Fredrickson and placed in Dr. Thompson Caul box for review.

## 2018-11-06 ENCOUNTER — Telehealth: Payer: Self-pay | Admitting: Interventional Cardiology

## 2018-11-06 NOTE — Progress Notes (Signed)
Cardiology Office Note:    Date:  11/07/2018   ID:  [name], DOB 10-30-1929, MRN [id number]  PCP:  [name], MD  Cardiologist:  Sinclair Grooms, MD   Referring MD: [name], MD   Chief Complaint  Patient presents with  . Shortness of Breath  . Atrial Fibrillation    This is a possible diagnosis.  Made from telemetry during echo  . Coronary Artery Disease    History of Present Illness:    [name] is a 85 y.o. male with a hx of CAD with prior coronary bypass grafting, COPD with cor pulmonale, hyperlipidemia, hypertension, PAD, and history of carotid obstructive disease. Chronic diastolic heart failure with EFwith EF 55 to 65% by most recent echo (HFpEF). Recent diagnosis of AF during ENT Clinic visit at [facility](but no ECG documentation found).  He has difficulty hearing.  He is chronically short of breath.  He is not having chest pain.  He is concerned about the diagnosis of "atrial fibrillation" contained within the summary of his echo report at [facility] from September 2020.  He saw [name] NP who had a 4-week monitor placed.  They provide an EKG from September from a different site that revealed an irregular rhythm due to PACs and blocked PACs.   Past Medical History:  Diagnosis Date  . Arthritis    "generalized" (04/04/2017)  . Asthma   . Basal cell carcinoma    "left ear"  . BPH (benign prostatic hyperplasia)    severe; s/p multiple biopsies  . CAD (coronary artery disease)   . Carotid artery occlusion   . Chronic respiratory failure ([place])   . Chronic rhinitis   . COPD (chronic obstructive pulmonary disease) (HCC)    2L [place] O2  . Diastolic heart failure ([place]) 2019  . Dilation of biliary tract   . Elevated troponin 03/09/2017  . Gallstones   . GERD (gastroesophageal reflux disease)   . History of blood transfusion    "w/his CABG" (04/04/2017)  . History of kidney stones   . Hyperlipidemia   . Hypertension   .  On home oxygen therapy    "~ 24/7" (04/04/2017)  . Peripheral vascular disease ([place])   . Pneumonia 2019  . Syncope and collapse   . Ureteral tumor 08/2015   had endoscopic procedure for evaluation, unable to reach for biopsy    Past Surgical History:  Procedure Laterality Date  . BASAL CELL CARCINOMA EXCISION Left    ear  . CARDIAC CATHETERIZATION     "prior to bypass"  . CATARACT EXTRACTION W/ INTRAOCULAR LENS  IMPLANT, BILATERAL Bilateral   . CORONARY ARTERY BYPASS GRAFT  10/2001   LIMA to LAD, SVG to OM1-2, SVG to RCA and PDA  . LITHOTRIPSY    . PROSTATE BIOPSY  Oct. 2014  . TONSILLECTOMY      Current Medications: Current Meds  Medication Sig  . ALPRAZolam (XANAX) 0.25 MG tablet TAKE 1 TABLET BY MOUTH THREE TIMES DAILY AS NEEDED  . aspirin 81 MG tablet Take 81 mg by mouth daily.  Marland Kitchen atorvastatin (LIPITOR) 20 MG tablet TAKE 1 TABLET BY MOUTH EVERY DAY  . budesonide (PULMICORT) 0.25 MG/2ML nebulizer solution One twice daily with performist  . Cholecalciferol (VITAMIN D3) 2000 units TABS Take 2,000 Units by mouth daily.  . finasteride (PROSCAR) 5 MG tablet Take 5 mg by mouth every morning.  . furosemide (LASIX) 20 MG tablet TAKE 1 TABLET  BY MOUTH EVERY DAY. MAY TAKE ADDITIONAL TABLET IF NEEDED FOR SWELLING  . ipratropium (ATROVENT) 0.06 % nasal spray U 2 SPRAYS IEN TID  . levalbuterol (XOPENEX HFA) 45 MCG/ACT inhaler Inhale 2 puffs into the lungs every 4 (four) hours as needed for wheezing or shortness of breath.  . levalbuterol (XOPENEX) 0.63 MG/3ML nebulizer solution USE 1 VIAL VIA NEBULIZER EVERY 4 HOURS AS NEEDED FOR WHEEZING OR SHORTNESS OF BREATH  . metoprolol succinate (TOPROL-XL) 25 MG 24 hr tablet Take 1 tablet (25 mg total) by mouth daily.  . mometasone (NASONEX) 50 MCG/ACT nasal spray SHAKE LIQUID AND USE 2 SPRAYS IN EACH NOSTRIL DAILY  . Multiple Vitamins-Minerals (PRESERVISION AREDS 2) CAPS Take 1 capsule by mouth daily.  . nitroGLYCERIN (NITROSTAT) 0.4 MG SL tablet  Place 1 tablet (0.4 mg total) under the tongue every 5 (five) minutes as needed for chest pain.  . OXYGEN Inhale 2 L continuous into the lungs.   . predniSONE (DELTASONE) 10 MG tablet FOR FLARE OF COUGH/WHEEZING MAY INCREASE TO 2 DAILY BETTER, THEN 1 DAILY  . sodium chloride (MURO 128) 2 % ophthalmic solution 1 drop.  . SPIRIVA RESPIMAT 2.5 MCG/ACT AERS INHALE 2 PUFFS BY MOUTH EVERY DAY  . spironolactone (ALDACTONE) 25 MG tablet TAKE 1/2 TABLET BY MOUTH EVERY DAY  . SYMBICORT 160-4.5 MCG/ACT inhaler Inhale 2 puffs into the lungs 2 (two) times daily.  . vitamin B-12 (CYANOCOBALAMIN) 100 MCG tablet Take 100 mcg by mouth daily.     Allergies:   Cefdinir, Nitrofurantoin, Sulfa antibiotics, Sulfonamide derivatives, Cefdinir, Ciprofloxacin, Nitrofurantoin, Doxycycline, Sertraline, Ciprofloxacin, Levaquin [levofloxacin], and Levaquin [levofloxacin]   Social History   Socioeconomic History  . Marital status: Married    Spouse name: Not on file  . Number of children: 2  . Years of education: Not on file  . Highest education level: Not on file  Occupational History  . 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 Financial resource strain: Not on file  . Food insecurity    Worry: Not on file    Inability: Not on file  . Transportation needs    Medical: Not on file    Non-medical: Not on file  Tobacco Use  . Smoking status: Former Smoker    Packs/day: 2.00    Years: 33.00    Pack years: 66.00    Types: Cigarettes    Quit date: 03/25/1982    Years since quitting: 36.6  . Smokeless tobacco: Never Used  Substance and Sexual Activity  . Alcohol use: Yes    Alcohol/week: 0.0 standard drinks    Comment: 1.5-3 oz daily in the past, minimal use in the last few months  . Drug use: No  . Sexual activity: Not on file  Lifestyle  . Physical activity    Days per week: Not on file    Minutes per session: Not on file  . Stress: Not on file   Relationships  . Social Herbalist on phone: Not on file    Gets together: Not on file    Attends religious service: Not on file    Active member of club or organization: Not on file    Attends meetings of clubs or organizations: Not on file    Relationship status: Not on file  Other Topics Concern  . Not on file  Social History Narrative   ** Merged History Encounter **       Pt lives  at home with his spouse.           Family History: The patient's family history includes Allergies in his brother; Breast cancer in his mother; Cancer in his mother; Heart attack in his father; Heart disease in his brother, brother, father, and mother; Hyperlipidemia in his brother; Hypertension in his brother and mother; Other in his mother; Peripheral vascular disease in his brother; Stroke in his brother.  ROS:   Please see the history of present illness.    He is concerned about his longevity.  He is having a difficult time hearing.  He is interested in my interpretation of the echocardiogram and whether there has been a change in his heart failure.  All other systems reviewed and are negative.  EKGs/Labs/Other Studies Reviewed:    The following studies were reviewed today:  2D Doppler echocardiogram at [facility] September 2020: _________________________________________________________________________________________ INTERPRETATION LIMITED ACOUSTIC WINDOWS. ALL IMAGES OBTAINED IN SUBCOSTAL WINDOW NORMAL LV SYSTOLIC FUNCTION BICUSPID AORTIC VALVE WITHOUT SIGNIFICANT AORTIC VALVE STENOSIS NORMAL RV SIZE AND FUNCTION WITH MILD PULMONARY HYPERTENSION (RVSP ~35-40MMHG) ELEVATED RIGHT ATRIAL PRESSURE A-FIB THROUGHOUT EXAM WITH EPISODES OF RAPID VENTRICULAR RESPONSE (130'S BPM) NO PRIOR STUDY FOR COMPARISON   EKG:  EKG performed earlier this month IP then the record.  The tracing reveals right bundle, right axis deviation, possible pulmonary hypertension, atrial  premature beats and blocked atrial beats.  This tracing does not have a specific date but the wife feels it was done in September 2020.  Recent Labs: 12/03/2017: B Natriuretic Peptide 104.6; Hemoglobin 13.1; Platelets 241; TSH 2.212 01/04/2018: Magnesium 2.6 07/10/2018: BUN 17; Creatinine, Ser 1.11; NT-Pro BNP 648; Potassium 4.1; Sodium 137  Recent Lipid Panel    Component Value Date/Time   CHOL 146 03/26/2017 0621   TRIG 34 03/26/2017 0621   HDL 79 03/26/2017 0621   CHOLHDL 1.8 03/26/2017 0621   VLDL 7 03/26/2017 0621   LDLCALC 60 03/26/2017 0621    Physical Exam:    VS:  BP 140/78   Pulse 99   Ht 5\' 9"  (1.753 m)   Wt 137 lb (62.1 kg)   SpO2 96%   BMI 20.23 kg/m     Wt Readings from Last 3 Encounters:  11/07/18 137 lb (62.1 kg)  10/22/18 135 lb (61.2 kg)  09/23/18 133 lb (60.3 kg)     GEN: Frail. No acute distress HEENT: Normal NECK: No JVD. LYMPHATICS: No lymphadenopathy CARDIAC: Irregular irregular RR without murmur, gallop,.  There is bilateral dorsal edema to shin greater than 2+.  This is been chronic.   VASCULAR:  Normal Pulses. No bruits. RESPIRATORY:  Clear to auscultation without rales, wheezing or rhonchi  ABDOMEN: Soft, non-tender, non-distended, No pulsatile mass, MUSCULOSKELETAL: No deformity  SKIN: Warm and dry NEUROLOGIC:  Alert and oriented x 3 PSYCHIATRIC:  Normal affect   ASSESSMENT:    1. Chronic diastolic CHF (congestive heart failure) (HCC)   2. Paroxysmal tachycardia ([place])   3. Coronary artery disease involving native coronary artery of native heart without angina pectoris   4. Chronic obstructive pulmonary disease, unspecified COPD type ([place])   5. Cor pulmonale, chronic (HCC)/ clinical dx    6. Mixed hyperlipidemia   7. Educated about COVID-19 virus infection    PLAN:    In order of problems listed above:  1. Stable 2. A 4-week monitor is being worn to determine if the patient has atrial fibrillation.  The only current evidence of  atrial for ablation is  a statement and the summary of his most recent echo from [facility].  Contrary to that diagnosis is a relatively recent EKG that demonstrates sinus rhythm with frequent PACs and blocked PACs.  Today's heart rhythm is irregularly irregular and probably reflects the latter rhythm. 3. Asymptomatic relative to angina. 4. Severe and end-stage.  Chronic O2 therapy required 5. Relative to #1 6. Not addressed 7. Practicing the 3 WS.  Overall poor prognosis.  We will have a conversation concerning anticoagulation if appropriate based on the 30-day monitor.  For the time being, continue aspirin    Medication Adjustments/Labs and Tests Ordered: Current medicines are reviewed at length with the patient today.  Concerns regarding medicines are outlined above.  No orders of the defined types were placed in this encounter.  No orders of the defined types were placed in this encounter.   Patient Instructions  Medication Instructions:  Your physician recommends that you continue on your current medications as directed. Please refer to the Current Medication list given to you today.  *If you need a refill on your cardiac medications before your next appointment, please call your pharmacy*  Lab Work: None If you have labs (blood work) drawn today and your tests are completely normal, you will receive your results only by: Marland Kitchen MyChart Message (if you have MyChart) OR . A paper copy in the mail If you have any lab test that is abnormal or we need to change your treatment, we will call you to review the results.  Testing/Procedures: None  Follow-Up: At [facility], you and your health needs are our priority.  As part of our continuing mission to provide you with exceptional heart care, we have created designated Provider Care Teams.  These Care Teams include your primary Cardiologist (physician) and Advanced Practice Providers (APPs -  Physician Assistants and  Nurse Practitioners) who all work together to provide you with the care you need, when you need it.  Your next appointment:   6 months  The format for your next appointment:   In Person  Provider:   [name], NP  Other Instructions      Signed, Sinclair Grooms, MD  11/07/2018 11:23 AM    [place]

## 2018-11-06 NOTE — Telephone Encounter (Signed)
Echo report is noted in office visit with [name], NP on 10/22/18. Results also in Care Everywhere.

## 2018-11-06 NOTE — Telephone Encounter (Signed)
° ° °  Patient wants to be sure Dr Tamala Julian review 9/29 echo from [facility]

## 2018-11-07 ENCOUNTER — Ambulatory Visit (INDEPENDENT_AMBULATORY_CARE_PROVIDER_SITE_OTHER): Payer: Medicare Other | Admitting: Interventional Cardiology

## 2018-11-07 ENCOUNTER — Encounter: Payer: Self-pay | Admitting: Interventional Cardiology

## 2018-11-07 ENCOUNTER — Other Ambulatory Visit: Payer: Self-pay

## 2018-11-07 VITALS — BP 140/78 | HR 99 | Ht 69.0 in | Wt 137.0 lb

## 2018-11-07 DIAGNOSIS — E782 Mixed hyperlipidemia: Secondary | ICD-10-CM | POA: Diagnosis not present

## 2018-11-07 DIAGNOSIS — I779 Disorder of arteries and arterioles, unspecified: Secondary | ICD-10-CM | POA: Diagnosis not present

## 2018-11-07 DIAGNOSIS — I2781 Cor pulmonale (chronic): Secondary | ICD-10-CM

## 2018-11-07 DIAGNOSIS — Z7189 Other specified counseling: Secondary | ICD-10-CM

## 2018-11-07 DIAGNOSIS — J449 Chronic obstructive pulmonary disease, unspecified: Secondary | ICD-10-CM | POA: Diagnosis not present

## 2018-11-07 DIAGNOSIS — I5032 Chronic diastolic (congestive) heart failure: Secondary | ICD-10-CM

## 2018-11-07 DIAGNOSIS — I2510 Atherosclerotic heart disease of native coronary artery without angina pectoris: Secondary | ICD-10-CM | POA: Diagnosis not present

## 2018-11-07 DIAGNOSIS — I479 Paroxysmal tachycardia, unspecified: Secondary | ICD-10-CM | POA: Diagnosis not present

## 2018-11-07 NOTE — Patient Instructions (Addendum)
Medication Instructions:  Your physician recommends that you continue on your current medications as directed. Please refer to the Current Medication list given to you today.  *If you need a refill on your cardiac medications before your next appointment, please call your pharmacy*  Lab Work: None If you have labs (blood work) drawn today and your tests are completely normal, you will receive your results only by: Marland Kitchen MyChart Message (if you have MyChart) OR . A paper copy in the mail If you have any lab test that is abnormal or we need to change your treatment, we will call you to review the results.  Testing/Procedures: None  Follow-Up: At [facility], you and your health needs are our priority.  As part of our continuing mission to provide you with exceptional heart care, we have created designated Provider Care Teams.  These Care Teams include your primary Cardiologist (physician) and Advanced Practice Providers (APPs -  Physician Assistants and Nurse Practitioners) who all work together to provide you with the care you need, when you need it.  Your next appointment:   6 months  The format for your next appointment:   In Person  Provider:   [name], NP  Cecilie Kicks, NP  Other Instructions

## 2018-11-08 ENCOUNTER — Other Ambulatory Visit: Payer: Self-pay

## 2018-11-08 DIAGNOSIS — I6523 Occlusion and stenosis of bilateral carotid arteries: Secondary | ICD-10-CM

## 2018-11-08 DIAGNOSIS — I779 Disorder of arteries and arterioles, unspecified: Secondary | ICD-10-CM

## 2018-11-08 NOTE — Telephone Encounter (Signed)
Pt seen in the office yesterday

## 2018-11-11 ENCOUNTER — Other Ambulatory Visit: Payer: Self-pay

## 2018-11-11 ENCOUNTER — Other Ambulatory Visit: Payer: Medicare Other | Admitting: Licensed Clinical Social Worker

## 2018-11-11 DIAGNOSIS — Z515 Encounter for palliative care: Secondary | ICD-10-CM

## 2018-11-11 NOTE — Progress Notes (Signed)
COMMUNITY PALLIATIVE CARE SW NOTE  PATIENT NAME: [name] DOB: Dec 03, 1929 MRN: [id number]  PRIMARY CARE PROVIDER: [name], MD  RESPONSIBLE PARTY:  Acct ID - Guarantor Home Phone Work Phone Relationship Acct Type  [id number] - [name],[name] [phone]  Self P/F     [place], [name], [place] [address]   Due to the COVID-19 crisis, this virtual check-in visit was done via telephone from my office and it was initiated and consent given by this patient and or family.  PLAN OF CARE and INTERVENTIONS:             1. GOALS OF CARE/ ADVANCE CARE PLANNING:  Patient wishes to feel better.  Patient is a full code. 2. SOCIAL/EMOTIONAL/SPIRITUAL ASSESSMENT/ INTERVENTIONS:  SW conducted a virtual check in visit with patient at his home.  Her reported "hanging in there".  Patient reviewed his medical condition is expressed feeling frustrated about his congestion.  He said he has felt down for the past three weeks both physically and emotionally.  He is tired most of the time.  SW provided active listening and supportive counseling. 3. PATIENT/CAREGIVER EDUCATION/ COPING:  Patient copes by problem-solving. 4. PERSONAL EMERGENCY PLAN:  He will rest when he becomes tired.  His wife will contact his MD. 5. COMMUNITY RESOURCES COORDINATION/ HEALTH CARE NAVIGATION:  None. 6. FINANCIAL/LEGAL CONCERNS/INTERVENTIONS:  None.     SOCIAL HX:  Social History   Tobacco Use  . Smoking status: Former Smoker    Packs/day: 2.00    Years: 33.00    Pack years: 66.00    Types: Cigarettes    Quit date: 03/25/1982    Years since quitting: 36.6  . Smokeless tobacco: Never Used  Substance Use Topics  . Alcohol use: Yes    Alcohol/week: 0.0 standard drinks    Comment: 1.5-3 oz daily in the past, minimal use in the last few months    CODE STATUS:  Full Code ADVANCED DIRECTIVES: N MOST FORM COMPLETE:  N HOSPICE EDUCATION PROVIDED: N PPS:  Appetite is normal.  Patient independently  ambulates. Duration of visit and documentation:  30 minutes.      Creola Corn Kasper Mudrick, LCSW

## 2018-11-12 ENCOUNTER — Other Ambulatory Visit: Payer: Self-pay | Admitting: Internal Medicine

## 2018-11-12 MED ORDER — ALPRAZOLAM 0.25 MG PO TABS: ORAL_TABLET | ORAL | 3 refills | Status: DC

## 2018-11-13 ENCOUNTER — Ambulatory Visit (HOSPITAL_COMMUNITY)
Admission: RE | Admit: 2018-11-13 | Discharge: 2018-11-13 | Disposition: A | Discharge status: Home / Self Care | Payer: Medicare Other | Source: Ambulatory Visit | Attending: Family | Admitting: Family

## 2018-11-13 ENCOUNTER — Other Ambulatory Visit: Payer: Self-pay

## 2018-11-13 ENCOUNTER — Ambulatory Visit (INDEPENDENT_AMBULATORY_CARE_PROVIDER_SITE_OTHER)
Admission: RE | Admit: 2018-11-13 | Discharge: 2018-11-13 | Disposition: A | Discharge status: Home / Self Care | Payer: Medicare Other | Source: Ambulatory Visit | Attending: Family | Admitting: Family

## 2018-11-13 ENCOUNTER — Ambulatory Visit (INDEPENDENT_AMBULATORY_CARE_PROVIDER_SITE_OTHER): Payer: Medicare Other | Admitting: Vascular Surgery

## 2018-11-13 ENCOUNTER — Encounter: Payer: Self-pay | Admitting: Vascular Surgery

## 2018-11-13 VITALS — BP 185/87 | HR 91 | Temp 97.4°F | Resp 20 | Ht 69.0 in | Wt 137.0 lb

## 2018-11-13 DIAGNOSIS — I779 Disorder of arteries and arterioles, unspecified: Secondary | ICD-10-CM | POA: Diagnosis not present

## 2018-11-13 DIAGNOSIS — I6521 Occlusion and stenosis of right carotid artery: Secondary | ICD-10-CM

## 2018-11-13 DIAGNOSIS — I6523 Occlusion and stenosis of bilateral carotid arteries: Secondary | ICD-10-CM

## 2018-11-13 NOTE — Progress Notes (Signed)
Patient name: [name] MRN: [id number] DOB: 27-Jun-1929 Sex: male  REASON FOR VISIT:   Follow-up of carotid disease and peripheral vascular disease.  HPI:   [name] is a pleasant 85 y.o. male been following with peripheral vascular disease.  In 2018 a carotid duplex scan showed a 40 to 59% right carotid stenosis.  This seemed to improve on his most recent study so he came in for a 1 year follow-up visit.  The patient is on home O2 and his activity is limited because of his pulmonary status.  He feels like his quality of life has gone down significantly in the last few months.  He describes some pain in his legs when he gets up in the morning to walk but does not describe rest pain.  He denies any nonhealing ulcers.  He has had a rash on his feet that is likely fungal.  He denies any focal weakness or paresthesias.  He denies any expressive or receptive aphasia.  He denies amaurosis fugax.  Past Medical History:  Diagnosis Date  . Arthritis    "generalized" (04/04/2017)  . Asthma   . Basal cell carcinoma    "left ear"  . BPH (benign prostatic hyperplasia)    severe; s/p multiple biopsies  . CAD (coronary artery disease)   . Carotid artery occlusion   . Chronic respiratory failure ([place])   . Chronic rhinitis   . COPD (chronic obstructive pulmonary disease) (H[facility])    2L [place] O2  . Diastolic heart failure ([place]) 2019  . Dilation of biliary tract   . Elevated troponin 03/09/2017  . Gallstones   . GERD (gastroesophageal reflux disease)   . History of blood transfusion    "w/his CABG" (04/04/2017)  . History of kidney stones   . Hyperlipidemia   . Hypertension   . On home oxygen therapy    "~ 24/7" (04/04/2017)  . Peripheral vascular disease ([place])   . Pneumonia 2019  . Syncope and collapse   . Ureteral tumor 08/2015   had endoscopic procedure for evaluation, unable to reach for biopsy    Family History  Problem Relation Age of Onset  . Heart disease Mother   . Heart  disease Father        Before age 70  . Breast cancer Mother   . Cancer Mother        Breast cancer  . Hypertension Mother   . Other Mother        AAA  and   Amputation  . Heart attack Father   . Stroke Brother        x3, still living   . Peripheral vascular disease Brother   . Heart disease Brother   . Hyperlipidemia Brother   . Hypertension Brother   . Heart disease Brother   . Allergies Brother     SOCIAL HISTORY: Social History   Tobacco Use  . Smoking status: Former Smoker    Packs/day: 2.00    Years: 33.00    Pack years: 66.00    Types: Cigarettes    Quit date: 03/25/1982    Years since quitting: 36.6  . Smokeless tobacco: Never Used  Substance Use Topics  . Alcohol use: Yes    Alcohol/week: 0.0 standard drinks    Comment: 1.5-3 oz daily in the past, minimal use in the last few months    Allergies  Allergen Reactions  . Cefdinir Other (See Comments)    Severe Diarrhea  .  Nitrofurantoin Swelling    Hand Swelling  . Sulfa Antibiotics Anaphylaxis and Swelling  . Sulfonamide Derivatives Swelling    REACTION: facial/tongue swelling  . Cefdinir Diarrhea    REACTION: diarrhea  . Ciprofloxacin Itching and Rash    Red itchy hands  . Nitrofurantoin Swelling    Swollen hands  . Doxycycline Other (See Comments)    Felt terrible  . Sertraline Other (See Comments)    jittery  . Ciprofloxacin Itching and Rash  . Levaquin [Levofloxacin] Itching and Rash  . Levaquin [Levofloxacin] Itching and Rash    Current Outpatient Medications  Medication Sig Dispense Refill  . ALPRAZolam (XANAX) 0.25 MG tablet TAKE 1 TABLET BY MOUTH THREE TIMES DAILY AS NEEDED 90 tablet 3  . aspirin 81 MG tablet Take 81 mg by mouth daily.    Marland Kitchen atorvastatin (LIPITOR) 20 MG tablet TAKE 1 TABLET BY MOUTH EVERY DAY 90 tablet 2  . budesonide (PULMICORT) 0.25 MG/2ML nebulizer solution One twice daily with performist 120 mL 12  . Cholecalciferol (VITAMIN D3) 2000 units TABS Take 2,000 Units by  mouth daily.    . finasteride (PROSCAR) 5 MG tablet Take 5 mg by mouth every morning.    . furosemide (LASIX) 20 MG tablet TAKE 1 TABLET BY MOUTH EVERY DAY. MAY TAKE ADDITIONAL TABLET IF NEEDED FOR SWELLING 45 tablet 2  . ipratropium (ATROVENT) 0.06 % nasal spray U 2 SPRAYS IEN TID    . levalbuterol (XOPENEX HFA) 45 MCG/ACT inhaler Inhale 2 puffs into the lungs every 4 (four) hours as needed for wheezing or shortness of breath. 15 g 3  . levalbuterol (XOPENEX) 0.63 MG/3ML nebulizer solution USE 1 VIAL VIA NEBULIZER EVERY 4 HOURS AS NEEDED FOR WHEEZING OR SHORTNESS OF BREATH 300 mL 3  . metoprolol succinate (TOPROL-XL) 25 MG 24 hr tablet Take 1 tablet (25 mg total) by mouth daily. 90 tablet 1  . mometasone (NASONEX) 50 MCG/ACT nasal spray SHAKE LIQUID AND USE 2 SPRAYS IN EACH NOSTRIL DAILY 17 g 3  . Multiple Vitamins-Minerals (PRESERVISION AREDS 2) CAPS Take 1 capsule by mouth daily.    . nitroGLYCERIN (NITROSTAT) 0.4 MG SL tablet Place 1 tablet (0.4 mg total) under the tongue every 5 (five) minutes as needed for chest pain. 25 tablet 3  . OXYGEN Inhale 2 L continuous into the lungs.     . predniSONE (DELTASONE) 10 MG tablet FOR FLARE OF COUGH/WHEEZING MAY INCREASE TO 2 DAILY BETTER, THEN 1 DAILY 100 tablet 2  . sodium chloride (MURO 128) 2 % ophthalmic solution 1 drop.    . SPIRIVA RESPIMAT 2.5 MCG/ACT AERS INHALE 2 PUFFS BY MOUTH EVERY DAY 4 g 3  . spironolactone (ALDACTONE) 25 MG tablet TAKE 1/2 TABLET BY MOUTH EVERY DAY 45 tablet 3  . SYMBICORT 160-4.5 MCG/ACT inhaler Inhale 2 puffs into the lungs 2 (two) times daily.    . vitamin B-12 (CYANOCOBALAMIN) 100 MCG tablet Take 100 mcg by mouth daily.     No current facility-administered medications for this visit.     REVIEW OF SYSTEMS:  [X]  denotes positive finding, [ ]  denotes negative finding Cardiac  Comments:  Chest pain or chest pressure: x   Shortness of breath upon exertion: x   Short of breath when lying flat:    Irregular heart  rhythm: x       Vascular    Pain in calf, thigh, or hip brought on by ambulation: x   Pain in feet at night that wakes you  up from your sleep:     Blood clot in your veins:    Leg swelling:  x       Pulmonary    Oxygen at home: x   Productive cough:     Wheezing:         Neurologic    Sudden weakness in arms or legs:     Sudden numbness in arms or legs:     Sudden onset of difficulty speaking or slurred speech:    Temporary loss of vision in one eye:     Problems with dizziness:  x       Gastrointestinal    Blood in stool:     Vomited blood:         Genitourinary    Burning when urinating:     Blood in urine:        Psychiatric    Major depression:  x       Hematologic    Bleeding problems:    Problems with blood clotting too easily:        Skin    Rashes or ulcers: x       Constitutional    Fever or chills:     PHYSICAL EXAM:   Vitals:   11/13/18 1408 11/13/18 1410  BP: (!) 175/88 (!) 185/87  Pulse: 91   Resp: 20   Temp: (!) 97.4 F (36.3 C)   SpO2: 98%   Weight: 137 lb (62.1 kg)   Height: 5\' 9"  (1.753 m)     GENERAL: The patient is a well-nourished male, in no acute distress. The vital signs are documented above. CARDIAC: There is a regular rate and rhythm.  VASCULAR: I do not detect carotid bruits. I cannot palpate pedal pulses. He has moderate bilateral lower extremity swelling. PULMONARY: There is good air exchange bilaterally without wheezing or rales. ABDOMEN: Soft and non-tender with normal pitched bowel sounds.  MUSCULOSKELETAL: There are no major deformities or cyanosis. NEUROLOGIC: No focal weakness or paresthesias are detected. SKIN: There are no ulcers or rashes noted. PSYCHIATRIC: The patient has a normal affect.  DATA:    ARTERIAL DOPPLER STUDY: I have independently interpreted his arterial Doppler study today.  On the right side there is a dampened monophasic posterior tibial and dorsalis pedis signal.  ABI is unobtainable as the  arteries are calcified.  Toe pressure is 0.  On the left side there is a dampened and monophasic posterior tibial signal with a barely audible dorsalis pedis signal the arteries are calcified and ABI cannot be obtained.  The toe pressure is 0.  CAROTID DUPLEX: I have independently interpreted his carotid duplex scan today.  On the right side there is a less than 39% stenosis.  The right vertebral artery is patent with antegrade flow.  On the left side there is a less than 39% stenosis.  The left vertebral artery is patent with antegrade flow.    MEDICAL ISSUES:   PERIPHERAL VASCULAR DISEASE: This patient has multilevel arterial occlusive disease.  He is 89 on home O2 and I would favor conservative treatment despite his dampened signals in both feet.  He does not have critical limb ischemia with no rest pain and no nonhealing ulcers.  I have encouraged him to stay as active as possible.  We also discussed the importance of nutrition.  Given that I would only consider arteriography if he developed a nonhealing wound or disabling rest pain I would not order formal ABIs at his  next visit in 1 year.  His arteries are calcified and the information is not especially useful at this point.  RIGHT CAROTID STENOSIS: His carotid duplex scan today shows resolution of the right carotid stenosis.  He has a less than 39% stenosis bilaterally.  He is asymptomatic.  He is on aspirin and is on a statin.  Deitra [facility] [phone]

## 2018-11-20 ENCOUNTER — Telehealth: Payer: Self-pay | Admitting: Internal Medicine

## 2018-11-20 NOTE — Telephone Encounter (Signed)
LMTCB for Alysa- is just the last ov note sufficient?

## 2018-11-21 NOTE — Telephone Encounter (Signed)
Will go ahead and fax a copy of the last OV to Alyssa's attention. Will close encounter.

## 2018-11-21 NOTE — Telephone Encounter (Signed)
Alyssa states needs the last office note for patient.  Fax number is [phone].  Phone number is [phone].

## 2018-11-26 ENCOUNTER — Telehealth: Payer: Self-pay

## 2018-11-26 NOTE — Telephone Encounter (Signed)
Spoke with the pt re: his [employer] monitor and he reports that he was sleeping well at the time of the strips... he says he has palpitations on a day off all day and is very tired. He has not been presyncopal but does have dizziness but feels it could be because of his breathing.   Per Dr. Johnsie Cancel (DOD)... pt is said to be in flutter and to forward to Dr. Tamala Julian for his review. R 152   Will place monitor in Dr. Thompson Caul physical box in the office since it is after 5pm and he will be here in the office in the a.m.

## 2018-11-26 NOTE — Telephone Encounter (Signed)
Follow up:      Patient returning call back. Please call patent back.

## 2018-11-26 NOTE — Telephone Encounter (Signed)
Received monitor from [employer] on the pt from today at 5:52 am [place] time Day 25 of 30... LMTCB.... will show monitor to DOD... questionable VT R 152  And PAC's R69...   Will show to DOD for review.

## 2018-11-27 NOTE — Telephone Encounter (Signed)
Strips were reviewed No action is recommended

## 2018-12-17 ENCOUNTER — Other Ambulatory Visit: Payer: Self-pay | Admitting: Internal Medicine

## 2018-12-17 MED ORDER — LEVALBUTEROL TARTRATE 45 MCG/ACT IN AERO: 2.0000 | INHALATION_SPRAY | RESPIRATORY_TRACT | 3 refills | Status: DC | PRN

## 2018-12-20 ENCOUNTER — Other Ambulatory Visit: Payer: Self-pay

## 2018-12-20 ENCOUNTER — Other Ambulatory Visit: Payer: Medicare Other | Admitting: *Deleted

## 2018-12-20 ENCOUNTER — Telehealth: Payer: Self-pay | Admitting: Interventional Cardiology

## 2018-12-20 DIAGNOSIS — R9431 Abnormal electrocardiogram [ECG] [EKG]: Secondary | ICD-10-CM

## 2018-12-20 NOTE — Telephone Encounter (Signed)
That will be okay if we can get magnesium that way.

## 2018-12-20 NOTE — Telephone Encounter (Signed)
Added orders for lab work and linked them to patient's appointment today.

## 2018-12-20 NOTE — Telephone Encounter (Signed)
Belva Crome, MD  12/19/2018 3:24 PM EST    Let the patient know the Monitor shows non sustained VT up to 8 seconds. Needs Magnesium and potassium levels drawn. Spoke with EP Dr Lovena Le who does not feel EP has anything to offer (Can't use Amio or any other drug, and there is no correlation between VT and fainting or symptoms). A copy will be sent to [name], MD   Called patient with monitor results. Patient has agreed to come in for lab work, but was wondering if he could have CMET and CBC also. Will forward to Dr. Tamala Julian to see if we can add lab orders to the Mg.

## 2018-12-20 NOTE — Telephone Encounter (Signed)
    Patient returning call re: [place] Please call

## 2018-12-21 LAB — COMPREHENSIVE METABOLIC PANEL
ALT: 21 IU/L (ref 0–44)
AST: 26 IU/L (ref 0–40)
Albumin/Globulin Ratio: 2.1 (ref 1.2–2.2)
Albumin: 4 g/dL (ref 3.6–4.6)
Alkaline Phosphatase: 78 IU/L (ref 39–117)
BUN/Creatinine Ratio: 16 (ref 10–24)
BUN: 18 mg/dL (ref 8–27)
Bilirubin Total: 0.4 mg/dL (ref 0.0–1.2)
CO2: 29 mmol/L (ref 20–29)
Calcium: 9.2 mg/dL (ref 8.6–10.2)
Chloride: 96 mmol/L (ref 96–106)
Creatinine, Ser: 1.13 mg/dL (ref 0.76–1.27)
GFR calc Af Amer: 67 mL/min/{1.73_m2} (ref >59)
GFR calc non Af Amer: 58 mL/min/{1.73_m2} — ABNORMAL LOW (ref >59)
Globulin, Total: 1.9 g/dL (ref 1.5–4.5)
Glucose: 132 mg/dL — ABNORMAL HIGH (ref 65–99)
Potassium: 4.2 mmol/L (ref 3.5–5.2)
Sodium: 139 mmol/L (ref 134–144)
Total Protein: 5.9 g/dL — ABNORMAL LOW (ref 6.0–8.5)

## 2018-12-21 LAB — CBC WITH DIFFERENTIAL/PLATELET
Basophils Absolute: 0.1 10*3/uL (ref 0.0–0.2)
Basos: 1 %
EOS (ABSOLUTE): 0 10*3/uL (ref 0.0–0.4)
Eos: 0 %
Hematocrit: 39.1 % (ref 37.5–51.0)
Hemoglobin: 13.1 g/dL (ref 13.0–17.7)
Immature Grans (Abs): 0.1 10*3/uL (ref 0.0–0.1)
Immature Granulocytes: 1 %
Lymphocytes Absolute: 0.7 10*3/uL (ref 0.7–3.1)
Lymphs: 6 %
MCH: 31.6 pg (ref 26.6–33.0)
MCHC: 33.5 g/dL (ref 31.5–35.7)
MCV: 94 fL (ref 79–97)
Monocytes Absolute: 0.8 10*3/uL (ref 0.1–0.9)
Monocytes: 7 %
Neutrophils Absolute: 9.7 10*3/uL — ABNORMAL HIGH (ref 1.4–7.0)
Neutrophils: 85 %
Platelets: 230 10*3/uL (ref 150–450)
RBC: 4.15 x10E6/uL (ref 4.14–5.80)
RDW: 12.5 % (ref 11.6–15.4)
WBC: 11.4 10*3/uL — ABNORMAL HIGH (ref 3.4–10.8)

## 2018-12-21 LAB — MAGNESIUM: Magnesium: 2.4 mg/dL — ABNORMAL HIGH (ref 1.6–2.3)

## 2018-12-23 ENCOUNTER — Other Ambulatory Visit: Payer: Self-pay

## 2018-12-23 ENCOUNTER — Ambulatory Visit (INDEPENDENT_AMBULATORY_CARE_PROVIDER_SITE_OTHER): Payer: Medicare Other | Admitting: Internal Medicine

## 2018-12-23 ENCOUNTER — Other Ambulatory Visit: Payer: Medicare Other | Admitting: *Deleted

## 2018-12-23 ENCOUNTER — Encounter: Payer: Self-pay | Admitting: Internal Medicine

## 2018-12-23 DIAGNOSIS — J9611 Chronic respiratory failure with hypoxia: Secondary | ICD-10-CM

## 2018-12-23 DIAGNOSIS — Z515 Encounter for palliative care: Secondary | ICD-10-CM

## 2018-12-23 DIAGNOSIS — I2781 Cor pulmonale (chronic): Secondary | ICD-10-CM | POA: Diagnosis not present

## 2018-12-23 DIAGNOSIS — J9612 Chronic respiratory failure with hypercapnia: Secondary | ICD-10-CM | POA: Diagnosis not present

## 2018-12-23 DIAGNOSIS — I779 Disorder of arteries and arterioles, unspecified: Secondary | ICD-10-CM | POA: Diagnosis not present

## 2018-12-23 DIAGNOSIS — J449 Chronic obstructive pulmonary disease, unspecified: Secondary | ICD-10-CM

## 2018-12-23 NOTE — Patient Instructions (Signed)
Make sure you check your oxygen saturations at highest level of activity to be sure it stays over 90% and adjust upward to maintain this level if needed but remember to turn it back to previous settings when you stop (to conserve your supply).    When breathing worse and need more xopenex than usual take prednisone 10 mg x 2 until better and then 1 day   Please schedule a follow up visit in 3 months but call sooner if needed

## 2018-12-23 NOTE — Progress Notes (Signed)
Subjective:     Patient ID: [name], male    DOB: [birth year]   MRN: [id number]   Brief patient profile:  42   yowm MM quit smoking in 1984 with GOLD IV/ 02 dep and steroid dep copd place on NIV by Dr Larkin Ina with profound chronic FTT not improved on NIV    History of Present Illness  05/25/2017  f/u ov/[name] re:  [name] iv copd/ 02 dep  Chief Complaint  Patient presents with  . Follow-up    Increased SOB and  fatigue x last week, 2.5L pulsed, compliant with spiriva, symbicort, and xopenex.   Dyspnea:  At rest/ worse walking 10 ft  Cough: none Sleep: on trilogy ok SABA use:  Confused with saba hfa vs neb  rec Add aldactone 50 mg daily  Continue triegy at your volition Ok to adjust xanax (alprazolam) from one-half to one three times a day to calm your breathing down and relieve anxiety related to breathlessness       09/23/2018  f/u ov/[name] re:  GOLD IV copd/ chronic resp failure, noct NIV dep / pred at 10 mg daily  Chief Complaint  Patient presents with  . Follow-up    F/U per patient for increased SOB and fatigue for the past month. Increased post nasal drip.   Dyspnea:  MMRC4  = sob if tries to leave home or while getting dressed   Cough: no Sleeping: on trilogy flat position  SABA use: about twice a day hfa xoepenex / neb works better and rec by [facility] pulmonary to try perform/bud instead of symbicort  02: 2lpm 24/7  rec Ok to stop symbicort and replace with performist and budesonide when available  See calendar for specific medication instructions and bring it back for each and every office visit for every healthcare provider you see.  Without it,  you may not receive the best quality medical care that we feel you deserve.   12/23/2018  f/u ov/[name] re: COPD GOLD IV 02 dep/ pred at 10 mg daily  Chief Complaint  Patient presents with  . Follow-up    Pt states he has not been doing well. States he is more SOB than before and also states dizziness is worse than usual.   Dyspnea:  MMRC4  = sob if tries to leave home or while getting dressed   Cough: no cough/ no mucus even with flutter  Sleeping: trilogy/ 2lpm seems to do ok SABA use: way too much, mostly just p doing activities, never before  02: 2lpm 24/7    No obvious day to day or daytime variability or assoc excess/ purulent sputum or mucus plugs or hemoptysis or cp or chest tightness, subjective wheeze or overt sinus or hb symptoms.   Sleeping as above without nocturnal  or early am exacerbation  of respiratory  c/o's or need for noct saba. Also denies any obvious fluctuation of symptoms with weather or environmental changes or other aggravating or alleviating factors except as outlined above   No unusual exposure hx or h/o childhood pna/ asthma or knowledge of premature birth.  Current Allergies, Complete Past Medical History, Past Surgical History, Family History, and Social History were reviewed in [employer] record.  ROS  The following are not active complaints unless bolded Hoarseness, sore throat, dysphagia, dental problems, itching, sneezing,  nasal congestion or discharge of excess mucus or purulent secretions, ear ache,   fever, chills, sweats, unintended wt loss or wt gain, classically pleuritic  or exertional cp,  orthopnea pnd or arm/hand swelling  or leg swelling, presyncope, palpitations, abdominal pain, anorexia, nausea, vomiting, diarrhea  or change in bowel habits or change in bladder habits, change in stools or change in urine, dysuria, hematuria,  rash, arthralgias, visual complaints, headache, numbness, weakness or ataxia or problems with walking or coordination,  change in mood or  memory.        Current Meds  Medication Sig  . ALPRAZolam (XANAX) 0.25 MG tablet TAKE 1 TABLET BY MOUTH THREE TIMES DAILY AS NEEDED  . aspirin 81 MG tablet Take 81 mg by mouth daily.  Marland Kitchen atorvastatin (LIPITOR) 20 MG tablet TAKE 1 TABLET BY MOUTH EVERY DAY  . Cholecalciferol  (VITAMIN D3) 2000 units TABS Take 2,000 Units by mouth daily.  . finasteride (PROSCAR) 5 MG tablet Take 5 mg by mouth every morning.  . furosemide (LASIX) 20 MG tablet TAKE 1 TABLET BY MOUTH EVERY DAY. MAY TAKE ADDITIONAL TABLET IF NEEDED FOR SWELLING  . levalbuterol (XOPENEX HFA) 45 MCG/ACT inhaler Inhale 2 puffs into the lungs every 4 (four) hours as needed for wheezing or shortness of breath.  . levalbuterol (XOPENEX) 0.63 MG/3ML nebulizer solution USE 1 VIAL VIA NEBULIZER EVERY 4 HOURS AS NEEDED FOR WHEEZING OR SHORTNESS OF BREATH  . metoprolol succinate (TOPROL-XL) 25 MG 24 hr tablet Take 1 tablet (25 mg total) by mouth daily.  . mometasone (NASONEX) 50 MCG/ACT nasal spray SHAKE LIQUID AND USE 2 SPRAYS IN EACH NOSTRIL DAILY  . Multiple Vitamins-Minerals (PRESERVISION AREDS 2) CAPS Take 1 capsule by mouth daily.  . nitroGLYCERIN (NITROSTAT) 0.4 MG SL tablet Place 1 tablet (0.4 mg total) under the tongue every 5 (five) minutes as needed for chest pain.  . OXYGEN Inhale 2 L continuous into the lungs.   . predniSONE (DELTASONE) 10 MG tablet FOR FLARE OF COUGH/WHEEZING MAY INCREASE TO 2 DAILY BETTER, THEN 1 DAILY  . sodium chloride (MURO 128) 5 % ophthalmic ointment 1 drop.   . SPIRIVA RESPIMAT 2.5 MCG/ACT AERS INHALE 2 PUFFS BY MOUTH EVERY DAY  . spironolactone (ALDACTONE) 25 MG tablet TAKE 1/2 TABLET BY MOUTH EVERY DAY  . SYMBICORT 160-4.5 MCG/ACT inhaler Inhale 2 puffs into the lungs 2 (two) times daily.  . vitamin B-12 (CYANOCOBALAMIN) 100 MCG tablet Take 100 mcg by mouth daily.                  Objective:   Physical Exam   Frail thin hoarse w/c bound  wm mild increased wob at rest / did get from chair to table for exam though with sob that resolved s rx with saba    wt 159 04/28/2010  > 10/14/2010 154  > 10/04/2011 150> 12/21/2011  150 > 149 01/29/2012 >154 04/05/2012 > 05/22/2012 148 > 09/30/2012  146 >>144> 10/15/2012 > 12/18/2012  145 > 148 02/14/2013 >148 03/14/2013 > 04/25/2013 148 >  06/19/2013 150 >>07/17/2013 > 08/27/2013 148 > 10/23/2013  148 >153 12/09/2013 > 02/26/2014  153 > 06/01/2014  152 > 157 06/15/2014 > 09/21/2014 146 > 10/19/2014 151 >>148 11/19/14 > 02/24/2015 148 > 06/30/2015    140 >  10/04/2015  136 > 12/20/2015   142 > 06/13/2016  132 > 09/13/2016   133 >  11/07/2016  139 > 01/11/2017   135 > 02/20/2017  135 >  03/22/2017  132 > 05/25/2017 134  > 06/27/2017  135 > 08/28/2017   135 > 09/13/2017   128  > 10/29/2017  129 >  12/10/2017  133 >  09/23/2018  133 > 12/23/2018   137     Vital signs reviewed - Note on arrival 02 sats  99% on 2 pulsed       HEENT : pt wearing mask not removed for exam due to covid -19 concerns.    NECK :  without JVD/Nodes/TM/ nl carotid upstrokes bilaterally   LUNGS: no acc muscle use,  Mod barrel  contour chest wall with bilateral  Distant bs s audible wheeze and  without cough on insp or exp maneuvers and mod  Hyperresonant  to  percussion bilaterally     CV:  RRR  no s3 or murmur or increase in P2, and 2+ pitting both LE's  ABD:  soft and nontender with pos mid insp Hoover's  in the supine position. No bruits or organomegaly appreciated, bowel sounds nl  MS:     ext warm without deformities, calf tenderness, cyanosis or clubbing No obvious joint restrictions   SKIN: warm and dry without lesions    NEURO:  alert, approp, nl sensorium with  no motor or cerebellar deficits apparent.

## 2018-12-24 ENCOUNTER — Encounter: Payer: Self-pay | Admitting: Internal Medicine

## 2018-12-24 NOTE — Assessment & Plan Note (Signed)
Quit smoking 1984/ MM - PFTs  05/04/05 FEV1 36% ratio 28% with 29% improvement after bronchodilators DLCO 48%  - PFT's 03/26/08 30% ratio 34 with 14% improvement after bronchodilators DLC0 38 %  - Referred to rehab 12/19/2012 > completed March 2015  - changed to spiriva respimat 02/14/2013  - started daily prednisone 06/19/13 > improved only a little> tapered off mid July 2015 > ok to change to prn prednisone 08/27/2013  - flared off nasonex and gerd rx 09/21/2014 > resumed - 10/19/2014  p extensive coaching HFA effectiveness =    90%  - changed pred to ceiling of 20 / floor of 5 mg daily as of 10/19/2014 > changed to floor of 10 mg daily 10/04/2015  - PFT's  06/08/2015  FEV1 0.58 (22 % ) ratio 27  p No sign % improvement from saba p symb/spiriva prior to study with DLCO  9/9 % corrects to 27 % for alv volume   - changed pred to ceiling of 20 and floor of 5 mg daily 06/13/16 > increased floor to 10 mg daily effective 02/20/2017  - referred back to rehab 09/13/2016  - try off noct vent and start titrating up xanax 01/11/2017 > no change but then admitted with flu and placed back on vent  - alpha one AT 03/01/17  MM      - 11/09/2017 COPD exacerbation. No improvement with prn zpack. CXR clear. Given course doxy and pred taper. Resp therapy home eval/treat  - Spirometry 09/16/2018  FEV1 0.60  (23%)  Ratio 0.33 at [facility]   Very little left to offer and way over using saba   I spent extra time with pt today reviewing appropriate use of albuterol for prn use on exertion with the following points: 1) saba is for relief of sob that does not improve by walking a slower pace or resting but rather if the pt does not improve after trying this first. 2) If the pt is convinced, as many are, that saba helps recover from activity faster then it's easy to tell if this is the case by re-challenging : ie stop, take the inhaler, then p 5 minutes try the exact same activity (intensity of workload) that just caused the symptoms and see if  they are substantially diminished or not after saba 3) if there is an activity that reproducibly causes the symptoms, try the saba 15 min before the activity on alternate days   If in fact the saba really does help, then fine to continue to use it prn but advised may need to look closer at the maintenance regimen being used to achieve better control of airways disease with exertion.   Also re prednisone: The goal with a chronic steroid dependent illness is always arriving at the lowest effective dose that controls the disease/symptoms and not accepting a set "formula" which is based on statistics or guidelines that don't always take into account patient  variability or the natural hx of the dz in every individual patient, which may well vary over time.  For now therefore I recommend the patient maintain  20 mg until better then 10 mg baseline.

## 2018-12-24 NOTE — Assessment & Plan Note (Addendum)
Started noct 02 at 2lpm 2008 and on 03/04/09 desat @ > 185 ft so rec wear with activtiy > rm to rm  - 02/15/2011   Walked RA x one lap @ 185 stopped due to  desat > corrected on 2lpm - 02/26/2014  Walked 2lpm  2 laps @ 185 ft each stopped due to  Sob/ sats 88% at nl pace  - 10/19/2014   Walked RA  2 laps @ 185 ft each stopped due to  End of study, slow pace,  sob  But no  desat   05/21/15 Eval by Dr Dedio/ rec trial of NIV  - HCO3  10/27/15 = 34  - HC03  10/06/16  = 28 ? On noct vent (non compliant) - As of admit  02/04/17 back on noct vent/ 02 2lpm 24/7 - HC03 36  08/28/2017 despite reporting using noct vent  - HC03 31  01/04/18  - HC03 32   07/10/18  - HC03  29 12/20/18   The ventilator has decreased his HC03 but that leaves him with less buffering capacity during the day and results in higher Ve demand than it would if remained in upper 30's so paradoxically has not helped his daytime fxn.  '  However, pt does use the NIV q noct and does report sleeps better with less am ha/ fogginess and in studies of NIV or not in this population there is a decrease in ER/ hospitalizations due to copd so I recommend he continue to use it qHS as he presently does.  Advised: Make sure you check your oxygen saturations at highest level of activity to be sure it stays over 90% and adjust upward to maintain this level if needed but remember to turn it back to previous settings when you stop (to conserve your supply).

## 2018-12-24 NOTE — Progress Notes (Signed)
COMMUNITY PALLIATIVE CARE RN NOTE  PATIENT NAME: [name] DOB: [birth year] MRN: [id number]  PRIMARY CARE PROVIDER: [name], MD  RESPONSIBLE PARTY: [name] (wife) Acct ID - Guarantor Home Phone Work Phone Relationship Acct Type  [id number] - [name] [phone]  Self P/F     [address], [name], [place] [address]   Due to the COVID-19 crisis, this virtual check-in visit was done via telephone from my office and it was initiated and consent by this patient and or family.  PLAN OF CARE and INTERVENTION:  1. ADVANCE CARE PLANNING/GOALS OF CARE: Goal is for patient to remain at home with his wife. He is a Full code. 2. PATIENT/CAREGIVER EDUCATION: Symptom Management 3. DISEASE STATUS: Virtual check-in visit completed via telephone. Spoke with both patient and his wife, [name], for patient update. Patient has just returned home from seeing his Pulmonologist today. There were no changes made to his current medications. Patient says that MD feels things seem stable. He had blood drawn on Friday and is awaiting results. Patient denies pain. He continues to report dyspnea with exertion and feeling "worn out" with any activity. He does try to walk daily around his home to keep his strength up. He is on Oxygen at 2L/min via [place] continuously and Trilogy machine at night. He also takes Xanax, which helps ease his breathing some and along with his Xopenex inhaler and nebulizer. He says it was recommended that when his breathing seems to worsen, to increase his Prednisone from 10 to 20 mg for a few days until breathing improves. He continues with sinus congestion. He is using Astelin nasal spray to help. He says that for about 1-2 hours after using it, his nose burns, but this symptom will eventually subside. He is also using saline rinses. He reports occasional chest pain, that tend to resolve on it's own. He has not had to utilize his Nitroglycerin recently. His wife talks about patient  having to wear a heart monitor for a month, which showed some irregularities, however says that his Cardiologist is going to consult with some other doctor's on his team for best treatment options. He reports issues with low BP at times, that has gotten as low as 88/45. He has issues with increasing edema in bilateral lower extremities, but states when he takes an additional Lasix, it tends to cause his BP to drop too low. He does try to elevate them during the day. His intake has been normal and weight stable at 137 lbs. He remains able to perform ADLs independently, but with rest periods. He continues to prefer telephone visits with our service d/t current pandemic. Will continue to monitor.    HISTORY OF PRESENT ILLNESS:  This is a 85 yo male who resides at home with his wife. Palliative care team continues to follow patient. Will continue to visit monthly and PRN.   CODE STATUS: Full code ADVANCED DIRECTIVES: Y MOST FORM: no PPS: 50%   (Duration of visit and documentation 45 minutes)   Travis Eastern, RN BSN

## 2018-12-24 NOTE — Assessment & Plan Note (Signed)
Worse swelling today on lasix /aldactone monitored by Dr Claiborne [name]   Reminded has prn extra dose lasix and should clearly be using it now   I had an extended discussion with the patient reviewing all relevant studies completed to date and  lasting 15 to 20 minutes of a 25 minute visit    Each maintenance medication was reviewed in detail including most importantly the difference between maintenance and prns and under what circumstances the prns are to be triggered using an action plan format that is not reflected in the computer generated alphabetically organized AVS but trather by a customized med calendar that reflects the AVS meds with confirmed 100% correlation   and can and should be used to optimize her outpt rx over the next 6 m to avoid unnecessary exp to pandemic until vaccine available.[name]     [name]

## 2019-01-08 ENCOUNTER — Telehealth: Payer: Self-pay

## 2019-01-08 DIAGNOSIS — N400 Enlarged prostate without lower urinary tract symptoms: Secondary | ICD-10-CM | POA: Diagnosis not present

## 2019-01-08 DIAGNOSIS — J449 Chronic obstructive pulmonary disease, unspecified: Secondary | ICD-10-CM | POA: Diagnosis not present

## 2019-01-08 DIAGNOSIS — I10 Essential (primary) hypertension: Secondary | ICD-10-CM | POA: Diagnosis not present

## 2019-01-08 DIAGNOSIS — I509 Heart failure, unspecified: Secondary | ICD-10-CM | POA: Diagnosis not present

## 2019-01-08 DIAGNOSIS — E782 Mixed hyperlipidemia: Secondary | ICD-10-CM | POA: Diagnosis not present

## 2019-01-08 DIAGNOSIS — F324 Major depressive disorder, single episode, in partial remission: Secondary | ICD-10-CM | POA: Diagnosis not present

## 2019-01-08 NOTE — Telephone Encounter (Signed)
Called and canceled pts appt w/ provider.  Demonstrated understanding and states that they will expecting the call back for the r/s.  Provider tested positive for Covid.

## 2019-01-13 ENCOUNTER — Ambulatory Visit (INDEPENDENT_AMBULATORY_CARE_PROVIDER_SITE_OTHER): Payer: Medicare Other | Admitting: Podiatry

## 2019-01-13 ENCOUNTER — Encounter: Payer: Self-pay | Admitting: Podiatry

## 2019-01-13 ENCOUNTER — Other Ambulatory Visit: Payer: Self-pay

## 2019-01-13 DIAGNOSIS — L89610 Pressure ulcer of right heel, unstageable: Secondary | ICD-10-CM | POA: Diagnosis not present

## 2019-01-13 DIAGNOSIS — M79674 Pain in right toe(s): Secondary | ICD-10-CM

## 2019-01-13 DIAGNOSIS — M79676 Pain in unspecified toe(s): Secondary | ICD-10-CM

## 2019-01-13 DIAGNOSIS — R21 Rash and other nonspecific skin eruption: Secondary | ICD-10-CM

## 2019-01-13 DIAGNOSIS — M79675 Pain in left toe(s): Secondary | ICD-10-CM | POA: Diagnosis not present

## 2019-01-13 DIAGNOSIS — B351 Tinea unguium: Secondary | ICD-10-CM | POA: Diagnosis not present

## 2019-01-13 MED ORDER — BETAMETHASONE DIPROPIONATE 0.05 % EX CREA: TOPICAL_CREAM | Freq: Two times a day (BID) | CUTANEOUS | 1 refills | Status: DC

## 2019-01-13 NOTE — Progress Notes (Signed)
Subjective: Patient presents today with diabetes and cc of painful, discolored, thick toenails which interfere with daily activities. Pain is aggravated when wearing enclosed shoe gear. Pain is getting progressively worse and relieved with periodic professional debridement.  His wife is present during today's visit.   [name] relates painful right heel on today's visit. States pain has been present for the past 2 weeks or so. Denies any drainage noticed on bedsheets or socks.  He has done nothing to treat the condition.  Wife states she will call his doctor because his feet are swelling again.    Travis Cruel, [name] is his PCP.  Current Outpatient Medications on File Prior to Visit  Medication Sig Dispense Refill  . ALPRAZolam (XANAX) 0.25 MG tablet TAKE 1 TABLET BY MOUTH THREE TIMES DAILY AS NEEDED 90 tablet 3  . aspirin 81 MG tablet Take 81 mg by mouth daily.    Marland Kitchen atorvastatin (LIPITOR) 20 MG tablet TAKE 1 TABLET BY MOUTH EVERY DAY 90 tablet 2  . azithromycin (ZITHROMAX) 250 MG tablet Take by mouth as directed.    . budesonide (PULMICORT) 0.25 MG/2ML nebulizer solution One twice daily with performist (Patient not taking: Reported on 12/23/2018) 120 mL 12  . Cholecalciferol (VITAMIN D3) 2000 units TABS Take 2,000 Units by mouth daily.    . finasteride (PROSCAR) 5 MG tablet Take 5 mg by mouth every morning.    . furosemide (LASIX) 20 MG tablet TAKE 1 TABLET BY MOUTH EVERY DAY. MAY TAKE ADDITIONAL TABLET IF NEEDED FOR SWELLING 45 tablet 2  . ipratropium (ATROVENT) 0.06 % nasal spray U 2 SPRAYS IEN TID    . levalbuterol (XOPENEX HFA) 45 MCG/ACT inhaler Inhale 2 puffs into the lungs every 4 (four) hours as needed for wheezing or shortness of breath. 15 g 3  . levalbuterol (XOPENEX) 0.63 MG/3ML nebulizer solution USE 1 VIAL VIA NEBULIZER EVERY 4 HOURS AS NEEDED FOR WHEEZING OR SHORTNESS OF BREATH 300 mL 3  . metoprolol succinate (TOPROL-XL) 25 MG 24 hr tablet Take 1 tablet (25 mg total) by  mouth daily. 90 tablet 1  . mometasone (NASONEX) 50 MCG/ACT nasal spray SHAKE LIQUID AND USE 2 SPRAYS IN EACH NOSTRIL DAILY 17 g 3  . Multiple Vitamins-Minerals (PRESERVISION AREDS 2) CAPS Take 1 capsule by mouth daily.    . nitroGLYCERIN (NITROSTAT) 0.4 MG SL tablet Place 1 tablet (0.4 mg total) under the tongue every 5 (five) minutes as needed for chest pain. 25 tablet 3  . OXYGEN Inhale 2 L continuous into the lungs.     . predniSONE (DELTASONE) 10 MG tablet FOR FLARE OF COUGH/WHEEZING MAY INCREASE TO 2 DAILY BETTER, THEN 1 DAILY 100 tablet 2  . sodium chloride (MURO 128) 5 % ophthalmic ointment 1 drop.     . SPIRIVA RESPIMAT 2.5 MCG/ACT AERS INHALE 2 PUFFS BY MOUTH EVERY DAY 4 g 3  . spironolactone (ALDACTONE) 25 MG tablet TAKE 1/2 TABLET BY MOUTH EVERY DAY 45 tablet 3  . SYMBICORT 160-4.5 MCG/ACT inhaler Inhale 2 puffs into the lungs 2 (two) times daily.    . vitamin B-12 (CYANOCOBALAMIN) 100 MCG tablet Take 100 mcg by mouth daily.     No current facility-administered medications on file prior to visit.     Allergies  Allergen Reactions  . Cefdinir Other (See Comments)    Severe Diarrhea  . Nitrofurantoin Swelling    Hand Swelling  . Sulfa Antibiotics Anaphylaxis and Swelling  . Sulfonamide Derivatives Swelling  REACTION: facial/tongue swelling  . Cefdinir Diarrhea    REACTION: diarrhea  . Ciprofloxacin Itching and Rash    Red itchy hands  . Nitrofurantoin Swelling    Swollen hands  . Doxycycline Other (See Comments)    Felt terrible  . Sertraline Other (See Comments)    jittery  . Ciprofloxacin Itching and Rash  . Levaquin [Levofloxacin] Itching and Rash  . Levaquin [Levofloxacin] Itching and Rash     Objective: 86 y.o. Caucasian male, frail, in NAD. On 02 via nasal cannula.  In NAD.  Vascular Examination: Capillary refill time immediate x 10 digits.  Dorsalis pedis and posterior tibial pulses nonpalpable b/l.  Digital hair absent b/l.  Skin temperature  gradient WNL b/l.  +2-3 pitting edema b/l feet.   Dermatological Examination: Skin thin, shiny and atrophic b/l.  Toenails 1-5 b/l discolored, thick, dystrophic with subungual debris and pain with palpation to nailbeds due to thickness of nails.  Decubitus Ulceration located posterior right heel: Measurements carried out today of 0.3 cm in diameter with stable eschar.  No periulcerative erythema, no edema, no drainage.  No lymphangitis. No purulence expressed. No flocculence, no odor.  Localized skin eruption noted dorsum and periphery of feet. No blisters, no weeping, no open wounds.  Musculoskeletal: Muscle strength 5/5 to all LE muscle groups.  Neurological: Sensation 5/5 b/l intact with 10 gram monofilament b/l.  Assessment: 1. Painful onychomycosis toenails 1-5 b/l 2. Decubitus ulceration right posterior heel 3. PAD  Plan: 1. Toenails 1-5 b/l were debrided in length and girth without iatrogenic bleeding. 2. Discussed decubitus ulceration right heel and need for pressure relief when in bed. Recommended heel pillows which he did get a pair of on today's visit. He is to wear whenever he is in bed. Do not attempt to walk with heel pillows on. Patient and wife related understanding.  3. New Rx for Betamethasone 0.05% Cream to be applied to rash twice daily for 4 weeks. 4. Patient/POA related understanding. 5. Follow up 3 months. 6. Patient/POA to call should there be a concern in the interim.

## 2019-01-13 NOTE — Patient Instructions (Addendum)
Rash, Adult  A rash is a change in the color of your skin. A rash can also change the way your skin feels. There are many different conditions and factors that can cause a rash. Follow these instructions at home: The goal of treatment is to stop the itching and keep the rash from spreading. Watch for any changes in your symptoms. Let your doctor know about them. Follow these instructions to help with your condition: Medicine Take or apply over-the-counter and prescription medicines only as told by your doctor. These may include medicines:  To treat red or swollen skin (corticosteroid creams).  To treat itching.  To treat an allergy (oral antihistamines).  To treat very bad symptoms (oral corticosteroids).  Skin care  Put cool cloths (compresses) on the affected areas.  Do not scratch or rub your skin.  Avoid covering the rash. Make sure that the rash is exposed to air as much as possible. Managing itching and discomfort  Avoid hot showers or baths. These can make itching worse. A cold shower may help.  Try taking a bath with: ? Epsom salts. You can get these at your local pharmacy or grocery store. Follow the instructions on the package. ? Baking soda. Pour a small amount into the bath as told by your doctor. ? Colloidal oatmeal. You can get this at your local pharmacy or grocery store. Follow the instructions on the package.  Try putting baking soda paste onto your skin. Stir water into baking soda until it gets like a paste.  Try putting on a lotion that relieves itchiness (calamine lotion).  Keep cool and out of the sun. Sweating and being hot can make itching worse. General instructions   Rest as needed.  Drink enough fluid to keep your pee (urine) pale yellow.  Wear loose-fitting clothing.  Avoid scented soaps, detergents, and perfumes. Use gentle soaps, detergents, perfumes, and other cosmetic products.  Avoid anything that causes your rash. Keep a journal to  help track what causes your rash. Write down: ? What you eat. ? What cosmetic products you use. ? What you drink. ? What you wear. This includes jewelry.  Keep all follow-up visits as told by your doctor. This is important. Contact a doctor if:  You sweat at night.  You lose weight.  You pee (urinate) more than normal.  You pee less than normal, or you notice that your pee is a darker color than normal.  You feel weak.  You throw up (vomit).  Your skin or the whites of your eyes look yellow (jaundice).  Your skin: ? Tingles. ? Is numb.  Your rash: ? Does not go away after a few days. ? Gets worse.  You are: ? More thirsty than normal. ? More tired than normal.  You have: ? New symptoms. ? Pain in your belly (abdomen). ? A fever. ? Watery poop (diarrhea). Get help right away if:  You have a fever and your symptoms suddenly get worse.  You start to feel mixed up (confused).  You have a very bad headache or a stiff neck.  You have very bad joint pains or stiffness.  You have jerky movements that you cannot control (seizure).  Your rash covers all or most of your body. The rash may or may not be painful.  You have blisters that: ? Are on top of the rash. ? Grow larger. ? Grow together. ? Are painful. ? Are inside your nose or mouth.  You have a rash   that: ? Looks like purple pinprick-sized spots all over your body. ? Has a "bull's eye" or looks like a target. ? Is red and painful, causes your skin to peel, and is not from being in the sun too long. Summary  A rash is a change in the color of your skin. A rash can also change the way your skin feels.  The goal of treatment is to stop the itching and keep the rash from spreading.  Take or apply over-the-counter and prescription medicines only as told by your doctor.  Contact a doctor if you have new symptoms or symptoms that get worse.  Keep all follow-up visits as told by your doctor. This is  important. This information is not intended to replace advice given to you by your health care provider. Make sure you discuss any questions you have with your health care provider. Document Revised: 04/19/2018 Document Reviewed: 07/30/2017 Elsevier Patient Education  [place].   Pressure Injury  A pressure injury is damage to the skin and underlying tissue that results from pressure being applied to an area of the body. It often affects people who must spend a long time in a bed or chair because of a medical condition. Pressure injuries usually occur:  Over bony parts of the body, such as the tailbone, shoulders, elbows, hips, heels, spine, ankles, and back of the head.  Under medical devices that make contact with the body, such as respiratory equipment, stockings, tubes, and splints. Pressure injuries start as reddened areas on the skin and can lead to pain and an open wound. What are the causes? This condition is caused by frequent or constant pressure to an area of the body. Decreased blood flow to the skin can eventually cause the skin tissue to die and break down, causing a wound. What increases the risk? You are more likely to develop this condition if you:  Are in the hospital or an extended care facility.  Are bedridden or in a wheelchair.  Have an injury or disease that keeps you from: ? Moving normally. ? Feeling pain or pressure.  Have a condition that: ? Makes you sleepy or less alert. ? Causes poor blood flow.  Need to wear a medical device.  Have poor control of your bladder or bowel functions (incontinence).  Have poor nutrition (malnutrition). If you are at risk for pressure injuries, your health care provider may recommend certain types of mattresses, mattress covers, pillows, cushions, or boots to help prevent them. These may include products filled with air, foam, gel, or sand. What are the signs or symptoms? Symptoms of this condition depend on  the severity of the injury. Symptoms may include:  Red or dark areas of the skin.  Pain, warmth, or a change of skin texture.  Blisters.  An open wound. How is this diagnosed? This condition is diagnosed with a medical history and physical exam. You may also have tests, such as:  Blood tests.  Imaging tests.  Blood flow tests. Your pressure injury will be staged based on its severity. Staging is based on:  The depth of the tissue injury, including whether there is exposure of muscle, bone, or tendon.  The cause of the pressure injury. How is this treated? This condition may be treated by:  Relieving or redistributing pressure on your skin. This includes: ? Frequently changing your position. ? Avoiding positions that caused the wound or that can make the wound worse. ? Using specific bed mattresses, chair  cushions, or protective boots. ? Moving medical devices from an area of pressure, or placing padding between the skin and the device. ? Using foams, creams, or powders to prevent rubbing (friction) on the skin.  Keeping your skin clean and dry. This may include using a skin cleanser or skin barrier as told by your health care provider.  Cleaning your injury and removing any dead tissue from the wound (debridement).  Placing a bandage (dressing) over your injury.  Using medicines for pain or to prevent or treat infection. Surgery may be needed if other treatments are not working or if your injury is very deep. Follow these instructions at home: Wound care  Follow instructions from your health care provider about how to take care of your wound. Make sure you: ? Wash your hands with soap and water before and after you change your bandage (dressing). If soap and water are not available, use hand sanitizer. ? Change your dressing as told by your health care provider.  Check your wound every day for signs of infection. Have a caregiver do this for you if you are not able.  Check for: ? Redness, swelling, or increased pain. ? More fluid or blood. ? Warmth. ? Pus or a bad smell. Skin care  Keep your skin clean and dry. Gently pat your skin dry.  Do not rub or massage your skin.  You or a caregiver should check your skin every day for any changes in color or any new blisters or sores (ulcers). Medicines  Take over-the-counter and prescription medicines only as told by your health care provider.  If you were prescribed an antibiotic medicine, take or apply it as told by your health care provider. Do not stop using the antibiotic even if your condition improves. Reducing and redistributing pressure  Do not lie or sit in one position for a long time. Move or change position every 1-2 hours, or as told by your health care provider.  Use pillows or cushions to reduce pressure. Ask your health care provider to recommend cushions or pads for you. General instructions   Eat a healthy diet that includes lots of protein.  Drink enough fluid to keep your urine pale yellow.  Be as active as you can every day. Ask your health care provider to suggest safe exercises or activities.  Do not abuse drugs or alcohol.  Do not use any products that contain nicotine or tobacco, such as cigarettes, e-cigarettes, and chewing tobacco. If you need help quitting, ask your health care provider.  Keep all follow-up visits as told by your health care provider. This is important. Contact a health care provider if:  You have: ? A fever or chills. ? Pain that is not helped by medicine. ? Any changes in skin color. ? New blisters or sores. ? Pus or a bad smell coming from your wound. ? Redness, swelling, or pain around your wound. ? More fluid or blood coming from your wound.  Your wound does not improve after 1-2 weeks of treatment. Summary  A pressure injury is damage to the skin and underlying tissue that results from pressure being applied to an area of the  body.  Do not lie or sit in one position for a long time. Your health care provider may advise you to move or change position every 1-2 hours.  Follow instructions from your health care provider about how to take care of your wound.  Keep all follow-up visits as told by your  health care provider. This is important. This information is not intended to replace advice given to you by your health care provider. Make sure you discuss any questions you have with your health care provider. Document Revised: 07/25/2017 Document Reviewed: 07/25/2017 Elsevier Patient Education  [place]? An infection that lies within the keratin of your nail plate that is caused by a fungus.  WHY ME? Fungal infections affect all ages, sexes, races, and creeds.  There may be many factors that predispose you to a fungal infection such as age, coexisting medical conditions such as diabetes, or an autoimmune disease; stress, medications, fatigue, genetics, etc.  Bottom line: fungus thrives in a warm, moist environment and your shoes offer such a location.  IS IT CONTAGIOUS? Theoretically, yes.  You do not want to share shoes, nail clippers or files with someone who has fungal toenails.  Walking around barefoot in the same room or sleeping in the same bed is unlikely to transfer the organism.  It is important to realize, however, that fungus can spread easily from one nail to the next on the same foot.  HOW DO WE TREAT THIS?  There are several ways to treat this condition.  Treatment may depend on many factors such as age, medications, pregnancy, liver and kidney conditions, etc.  It is best to ask your doctor which options are available to you.  3. No treatment.   Unlike many other medical concerns, you can live with this condition.  However for many people this can be a painful condition and may lead to ingrown toenails or a bacterial infection.  It is recommended that  you keep the nails cut short to help reduce the amount of fungal nail. 4. Topical treatment.  These range from herbal remedies to prescription strength nail lacquers.  About 40-50% effective, topicals require twice daily application for approximately 9 to 12 months or until an entirely new nail has grown out.  The most effective topicals are medical grade medications available through physicians offices. 5. Oral antifungal medications.  With an 80-90% cure rate, the most common oral medication requires 3 to 4 months of therapy and stays in your system for a year as the new nail grows out.  Oral antifungal medications do require blood work to make sure it is a safe drug for you.  A liver function panel will be performed prior to starting the medication and after the first month of treatment.  It is important to have the blood work performed to avoid any harmful side effects.  In general, this medication safe but blood work is required. 6. Laser Therapy.  This treatment is performed by applying a specialized laser to the affected nail plate.  This therapy is noninvasive, fast, and non-painful.  It is not covered by insurance and is therefore, out of pocket.  The results have been very good with a 80-95% cure rate.  [facility] is the only practice in the area to offer this therapy. 7. Permanent Nail Avulsion.  Removing the entire nail so that a new nail will not grow back.

## 2019-01-15 ENCOUNTER — Telehealth: Payer: Self-pay | Admitting: Neurology

## 2019-01-15 ENCOUNTER — Ambulatory Visit: Payer: Medicare Other | Admitting: Neurology

## 2019-01-15 NOTE — Telephone Encounter (Signed)
I called patient and LVM to reschedule cancelled appt d/t MD out of office.

## 2019-01-22 ENCOUNTER — Telehealth: Payer: Self-pay | Admitting: Internal Medicine

## 2019-01-22 NOTE — Telephone Encounter (Signed)
Spoke with [employer]. She stated that the patient is at risk of losing his Trilogy machine based on the office note from 12/23/18. She stated that the way office note is written, the patient is not benefiting from the machine and does not need to use it. As a result, the insurance is requesting [facility] to pick up the machine from the patient.   The RT department has been in contact with the patient and his wife. They do not want to give up the machine. They were under the impression that MW would include in the note that he was using it.   Tye Maryland wants to know if MW would be willing to addend the note from 12/23/18. Once this has been done, she would like to have the notes faxed to [phone] [facility].   MW, please advise. Thanks!

## 2019-01-22 NOTE — Telephone Encounter (Signed)
Will do!

## 2019-01-22 NOTE — Telephone Encounter (Signed)
OV note has been printed. Will fax to [facility] with the addendum circled. Will close encounter.

## 2019-01-22 NOTE — Telephone Encounter (Signed)
Left detailed message for [name] to let her know that MW has agreed to addend the note. Will route encounter to myself to follow up after this has been done.

## 2019-01-24 ENCOUNTER — Telehealth: Payer: Self-pay | Admitting: Internal Medicine

## 2019-01-24 NOTE — Telephone Encounter (Signed)
Printed note and faxed to the number provided for [facility]

## 2019-01-24 NOTE — Telephone Encounter (Signed)
Already done

## 2019-01-24 NOTE — Telephone Encounter (Signed)
Spoke with [name] at [employer]  She states needing an addendum added to last ov 12/23/18 stating that the pt is using and benefiting from his trilogy vent  Please advise when done and we can fax to the number they provided  Thanks

## 2019-01-31 ENCOUNTER — Telehealth: Payer: Self-pay | Admitting: Internal Medicine

## 2019-01-31 MED ORDER — SPIRIVA RESPIMAT 2.5 MCG/ACT IN AERS: INHALATION_SPRAY | RESPIRATORY_TRACT | 3 refills | Status: DC

## 2019-01-31 NOTE — Telephone Encounter (Signed)
Rx for spiriva was refilled  LMOM letting the pt know this was done

## 2019-02-03 ENCOUNTER — Telehealth: Payer: Self-pay | Admitting: Licensed Clinical Social Worker

## 2019-02-03 NOTE — Telephone Encounter (Signed)
Palliative Care SW left a vm for patient's wife, [name], to schedule a visit.

## 2019-02-05 NOTE — Telephone Encounter (Signed)
Left message for patient to call if no improvement with recommendations he had gotten from cardiology.

## 2019-02-07 ENCOUNTER — Other Ambulatory Visit: Payer: Medicare Other | Admitting: Licensed Clinical Social Worker

## 2019-02-07 ENCOUNTER — Ambulatory Visit: Payer: Self-pay

## 2019-02-07 DIAGNOSIS — Z515 Encounter for palliative care: Secondary | ICD-10-CM

## 2019-02-08 NOTE — Progress Notes (Signed)
COMMUNITY PALLIATIVE CARE SW NOTE  PATIENT NAME: [name] DOB: Sep 27, 1929 MRN: [id number]  PRIMARY CARE PROVIDER: [name], MD  RESPONSIBLE PARTY:  Acct ID - Guarantor Home Phone Work Phone Relationship Acct Type  [id number] - [name],Travis L [phone]  Self P/F     [name], [place], [place] [address]   Due to the COVID-19 crisis, this virtual check-in visit was done via telephone from my office and it was initiated and consent given by thispatient.  PLAN OF CARE and INTERVENTIONS:             1. GOALS OF CARE/ ADVANCE CARE PLANNING:  Goal of patient is to "feel better".  He is a full code. 2. SOCIAL/EMOTIONAL/SPIRITUAL ASSESSMENT/ INTERVENTIONS:  SW conducted a Sales executive visit with patient's wife, [name].  She reports patient is feeling the same.  He continues seeking answers from MDs to try and relieve his various symptoms.  [name]'s primary concern was obtaining the vaccine for COVID.  She stated she received a message from [name] recently that indicated she and [name] will be able to sign up to receive the vaccine soon. 3. PATIENT/CAREGIVER EDUCATION/ COPING:  [name] will sit or lie down when he becomes fatigued.   4. PERSONAL EMERGENCY PLAN:  [name] will contact patient's MD. 5. COMMUNITY RESOURCES COORDINATION/ HEALTH CARE NAVIGATION:  None. 6. FINANCIAL/LEGAL CONCERNS/INTERVENTIONS:  None.     SOCIAL HX:  Social History   Tobacco Use  . Smoking status: Former Smoker    Packs/day: 2.00    Years: 33.00    Pack years: 66.00    Types: Cigarettes    Quit date: 03/25/1982    Years since quitting: 36.9  . Smokeless tobacco: Never Used  Substance Use Topics  . Alcohol use: Yes    Alcohol/week: 0.0 standard drinks    Comment: 1.5-3 oz daily in the past, minimal use in the last few months    CODE STATUS:  Full Code  ADVANCED DIRECTIVES: N MOST FORM COMPLETE:  N HOSPICE EDUCATION PROVIDED:  N PPS:  Appetite is normal.  He ambulates  independently. Duration of visit and documentation:  30 minutes.      Travis Corn Hero Kulish, LCSW

## 2019-02-10 ENCOUNTER — Other Ambulatory Visit: Payer: Self-pay

## 2019-02-11 ENCOUNTER — Other Ambulatory Visit: Payer: Self-pay

## 2019-02-11 ENCOUNTER — Encounter: Payer: Self-pay | Admitting: Internal Medicine

## 2019-02-11 ENCOUNTER — Ambulatory Visit (INDEPENDENT_AMBULATORY_CARE_PROVIDER_SITE_OTHER): Payer: Medicare Other | Admitting: Internal Medicine

## 2019-02-11 ENCOUNTER — Ambulatory Visit (INDEPENDENT_AMBULATORY_CARE_PROVIDER_SITE_OTHER): Payer: Medicare Other

## 2019-02-11 DIAGNOSIS — J9611 Chronic respiratory failure with hypoxia: Secondary | ICD-10-CM | POA: Diagnosis not present

## 2019-02-11 DIAGNOSIS — I2781 Cor pulmonale (chronic): Secondary | ICD-10-CM | POA: Diagnosis not present

## 2019-02-11 DIAGNOSIS — J449 Chronic obstructive pulmonary disease, unspecified: Secondary | ICD-10-CM | POA: Diagnosis not present

## 2019-02-11 DIAGNOSIS — J9612 Chronic respiratory failure with hypercapnia: Secondary | ICD-10-CM

## 2019-02-11 DIAGNOSIS — R0602 Shortness of breath: Secondary | ICD-10-CM | POA: Diagnosis not present

## 2019-02-11 NOTE — Patient Instructions (Addendum)
Target 02 saturation is over 90% ,  No benefit to driving it higher  Add pepcid 20 mg at bedtime  Let cardiology know about your leg swelling - aldactone adjustments should be considered   Please remember to go to the  x-ray department  for your tests - we will call you with the results when they are available    Keep previous appt.

## 2019-02-11 NOTE — Progress Notes (Signed)
Subjective:     Patient ID: [name], male    DOB: Oct 13, 1929   MRN: [id number]   Brief patient profile:  15   yowm MM quit smoking in 1984 with GOLD IV/ 02 dep and steroid dep copd place on NIV by Dr Larkin Ina with profound chronic FTT not improved on NIV    History of Present Illness  05/25/2017  f/u ov/[name] re:  [name] iv copd/ 02 dep  Chief Complaint  Patient presents with  . Follow-up    Increased SOB and  fatigue x last week, 2.5L pulsed, compliant with spiriva, symbicort, and xopenex.   Dyspnea:  At rest/ worse walking 10 ft  Cough: none Sleep: on trilogy ok SABA use:  Confused with saba hfa vs neb  rec Add aldactone 50 mg daily  Continue triegy at your volition Ok to adjust xanax (alprazolam) from one-half to one three times a day to calm your breathing down and relieve anxiety related to breathlessness       09/23/2018  f/u ov/[name] re:  GOLD IV copd/ chronic resp failure, noct NIV dep / pred at 10 mg daily  Chief Complaint  Patient presents with  . Follow-up    F/U per patient for increased SOB and fatigue for the past month. Increased post nasal drip.   Dyspnea:  MMRC4  = sob if tries to leave home or while getting dressed   Cough: no Sleeping: on trilogy flat position  SABA use: about twice a day hfa xoepenex / neb works better and rec by [facility] pulmonary to try perform/bud instead of symbicort  02: 2lpm 24/7  rec Ok to stop symbicort and replace with performist and budesonide when available  See calendar for specific medication instructions and bring it back for each and every office visit for every healthcare provider you see.  Without it,  you may not receive the best quality medical care that we feel you deserve.   02/11/2019  f/u ov/[name] re: COPD GOLD IV 02 dep/ pred at 10 mg daily floor, no better on 10 ceiling  Chief Complaint  Patient presents with  . Follow-up    Pt c/o increased SOB x 5 days. He also c/o sinus congestion. He has been noticing swelling in  both legs over the past month.    Dyspnea:  Across the room assoc with leg swelling worse since decreased aldactone per cards to 12.5 mg daily   Cough: none  Sleeping: on vent  SABA use: more  02: 2-3 lpm 24 / 7 adjusting according to how he feels rather than monitoring his pulse oximeter as recommended.   No obvious day to day or daytime variability or assoc excess/ purulent sputum or mucus plugs or hemoptysis or cp or chest tightness, subjective wheeze or overt sinus or hb symptoms.   Sleeping fine on vent  without nocturnal  or early am exacerbation  of respiratory  c/o's or need for noct saba. Also denies any obvious fluctuation of symptoms with weather or environmental changes or other aggravating or alleviating factors except as outlined above   No unusual exposure hx or h/o childhood pna/ asthma or knowledge of premature birth.  Current Allergies, Complete Past Medical History, Past Surgical History, Family History, and Social History were reviewed in [employer] record.  ROS  The following are not active complaints unless bolded Hoarseness, sore throat, dysphagia, dental problems, itching, sneezing,  nasal congestion or discharge of excess mucus or purulent secretions, ear ache,  fever, chills, sweats, unintended wt loss or wt gain, classically pleuritic or exertional cp,  orthopnea pnd or arm/hand swelling  or leg swelling, presyncope, palpitations, abdominal pain, anorexia, nausea, vomiting, diarrhea  or change in bowel habits or change in bladder habits, change in stools or change in urine, dysuria, hematuria,  rash, arthralgias, visual complaints, headache, numbness, weakness or ataxia or problems with walking or coordination,  change in mood or  memory.        Current Meds  Medication Sig  . ALPRAZolam (XANAX) 0.25 MG tablet TAKE 1 TABLET BY MOUTH THREE TIMES DAILY AS NEEDED  . aspirin 81 MG tablet Take 81 mg by mouth daily.  Marland Kitchen atorvastatin (LIPITOR) 20 MG  tablet TAKE 1 TABLET BY MOUTH EVERY DAY  . betamethasone dipropionate 0.05 % cream Apply topically 2 (two) times daily. Apply to both feet twice daily for 4 weeks.  . budesonide (PULMICORT) 0.25 MG/2ML nebulizer solution One twice daily with performist  . Cholecalciferol (VITAMIN D3) 2000 units TABS Take 2,000 Units by mouth daily.  . finasteride (PROSCAR) 5 MG tablet Take 5 mg by mouth every morning.  . furosemide (LASIX) 20 MG tablet TAKE 1 TABLET BY MOUTH EVERY DAY. MAY TAKE ADDITIONAL TABLET IF NEEDED FOR SWELLING  . ipratropium (ATROVENT) 0.06 % nasal spray U 2 SPRAYS IEN TID  . levalbuterol (XOPENEX HFA) 45 MCG/ACT inhaler Inhale 2 puffs into the lungs every 4 (four) hours as needed for wheezing or shortness of breath.  . levalbuterol (XOPENEX) 0.63 MG/3ML nebulizer solution USE 1 VIAL VIA NEBULIZER EVERY 4 HOURS AS NEEDED FOR WHEEZING OR SHORTNESS OF BREATH  . metoprolol succinate (TOPROL-XL) 25 MG 24 hr tablet Take 1 tablet (25 mg total) by mouth daily.  . mometasone (NASONEX) 50 MCG/ACT nasal spray SHAKE LIQUID AND USE 2 SPRAYS IN EACH NOSTRIL DAILY  . Multiple Vitamins-Minerals (PRESERVISION AREDS 2) CAPS Take 1 capsule by mouth daily.  . nitroGLYCERIN (NITROSTAT) 0.4 MG SL tablet Place 1 tablet (0.4 mg total) under the tongue every 5 (five) minutes as needed for chest pain.  . OXYGEN Inhale 2 L continuous into the lungs.   . predniSONE (DELTASONE) 10 MG tablet FOR FLARE OF COUGH/WHEEZING MAY INCREASE TO 2 DAILY BETTER, THEN 1 DAILY  . sodium chloride (MURO 128) 5 % ophthalmic ointment 1 drop.   Marland Kitchen spironolactone (ALDACTONE) 25 MG tablet TAKE 1/2 TABLET BY MOUTH EVERY DAY  . SYMBICORT 160-4.5 MCG/ACT inhaler Inhale 2 puffs into the lungs 2 (two) times daily.  . Tiotropium Bromide Monohydrate (SPIRIVA RESPIMAT) 2.5 MCG/ACT AERS INHALE 2 PUFFS BY MOUTH EVERY DAY  . vitamin B-12 (CYANOCOBALAMIN) 100 MCG tablet Take 100 mcg by mouth daily.             Objective:   Physical  Exam   Thin w/c bound hoarse wm using acc muscles at rest    Vital signs reviewed  02/11/2019  - Note at rest 02 sats  100% on 3lpm pulsed    wt 159 04/28/2010  > 10/14/2010 154  > 10/04/2011 150> 12/21/2011  150 > 149 01/29/2012 >154 04/05/2012 > 05/22/2012 148 > 09/30/2012  146 >>144> 10/15/2012 > 12/18/2012  145 > 148 02/14/2013 >148 03/14/2013 > 04/25/2013 148 > 06/19/2013 150 >>07/17/2013 > 08/27/2013 148 > 10/23/2013  148 >153 12/09/2013 > 02/26/2014  153 > 06/01/2014  152 > 157 06/15/2014 > 09/21/2014 146 > 10/19/2014 151 >>148 11/19/14 > 02/24/2015 148 > 06/30/2015    140 >  10/04/2015  136 >  12/20/2015   142 > 06/13/2016  132 > 09/13/2016   133 >  11/07/2016  139 > 01/11/2017   135 > 02/20/2017  135 >  03/22/2017  132 > 05/25/2017 134  > 06/27/2017  135 > 08/28/2017   135 > 09/13/2017   128  > 10/29/2017  129 > 12/10/2017  133 >  09/23/2018  133 > 12/23/2018   137 > 02/11/2019   138    HEENT : pt wearing mask not removed for exam due to covid -19 concerns.    NECK :  without JVD/Nodes/TM/ nl carotid upstrokes bilaterally   LUNGS: no acc muscle use,  Mod barrel  contour chest wall with bilateral  Distant bs s audible wheeze and  without cough on insp or exp maneuvers and mod  Hyperresonant  to  percussion bilaterally     CV:  RRR  no s3 or murmur or increase in P2, and  2+ pitting edema sym both LEs  ABD:  soft and nontender with pos mid insp Hoover's  in the supine position. No bruits or organomegaly appreciated, bowel sounds nl  MS:     ext warm without deformities, calf tenderness, cyanosis or clubbing No obvious joint restrictions   SKIN: warm and dry without lesions    NEURO:  alert, approp, nl sensorium with  no motor or cerebellar deficits apparent.          Lab Results  Component Value Date   CREATININE 1.13 12/20/2018   CREATININE 1.11 07/10/2018   CREATININE 1.15 03/01/2018         CXR PA and Lateral:   02/11/2019 :    I personally reviewed images and agree with radiology impression as follows:     Emphysema, no acute airspace disease.

## 2019-02-12 ENCOUNTER — Telehealth: Payer: Self-pay | Admitting: Internal Medicine

## 2019-02-12 ENCOUNTER — Encounter: Payer: Self-pay | Admitting: Internal Medicine

## 2019-02-12 NOTE — Progress Notes (Signed)
Spoke with pt and notified of results per Dr. Wert. Pt verbalized understanding and denied any questions. 

## 2019-02-12 NOTE — Assessment & Plan Note (Signed)
He is having worse leg swelling that he attributes to cutting down on Aldactone which would be the drug that I would consider pushing harder in this setting.  However, since all of his diuretic regimen is being directed by cardiology I deferred him back to cardiology for further adjustments.         Each maintenance medication was reviewed in detail including emphasizing most importantly the difference between maintenance and prns and under what circumstances the prns are to be triggered using an action plan format where appropriate.  Total time for H and P, chart review, counseling,  and generating customized AVS unique to this office visit / charting = 20 m

## 2019-02-12 NOTE — Telephone Encounter (Signed)
Spoke with the pt and notified of his cxr results and he verbalized understanding  Nothing further needed

## 2019-02-12 NOTE — Assessment & Plan Note (Signed)
Started noct 02 at 2lpm 2008 and on 03/04/09 desat @ > 185 ft so rec wear with activtiy > rm to rm  - 02/15/2011   Walked RA x one lap @ 185 stopped due to  desat > corrected on 2lpm - 02/26/2014  Walked 2lpm  2 laps @ 185 ft each stopped due to  Sob/ sats 88% at nl pace  - 10/19/2014   Walked RA  2 laps @ 185 ft each stopped due to  End of study, slow pace,  sob  But no  desat   05/21/15 Eval by Dr Dedio/ rec trial of NIV  - HCO3  10/27/15 = 34  - HC03  10/06/16  = 28 ? On noct vent (non compliant) - As of admit  02/04/17 back on noct vent/ 02 2lpm 24/7 - HC03 36  08/28/2017 despite reporting using noct vent  - HC03 31  01/04/18  - HC03 32   07/10/18  - HC03  29 12/20/18   Reminded him today again that he needs to adjust his oxygen according to his saturations, not according to his level of breathlessness, with a goal of keeping it above 90% at all times.

## 2019-02-12 NOTE — Assessment & Plan Note (Signed)
Quit smoking 1984/ MM - PFTs  05/04/05 FEV1 36% ratio 28% with 29% improvement after bronchodilators DLCO 48%  - PFT's 03/26/08 30% ratio 34 with 14% improvement after bronchodilators DLC0 38 %  - Referred to rehab 12/19/2012 > completed March 2015  - changed to spiriva respimat 02/14/2013  - started daily prednisone 06/19/13 > improved only a little> tapered off mid July 2015 > ok to change to prn prednisone 08/27/2013  - flared off nasonex and gerd rx 09/21/2014 > resumed - 10/19/2014  p extensive coaching HFA effectiveness =    90%  - changed pred to ceiling of 20 / floor of 5 mg daily as of 10/19/2014 > changed to floor of 10 mg daily 10/04/2015  - PFT's  06/08/2015  FEV1 0.58 (22 % ) ratio 27  p No sign % improvement from saba p symb/spiriva prior to study with DLCO  9/9 % corrects to 27 % for alv volume   - changed pred to ceiling of 20 and floor of 5 mg daily 06/13/16 > increased floor to 10 mg daily effective 02/20/2017  - referred back to rehab 09/13/2016  - try off noct vent and start titrating up xanax 01/11/2017 > no change but then admitted with flu and placed back on vent  - alpha one AT 03/01/17  MM      - 11/09/2017 COPD exacerbation. No improvement with prn zpack. CXR clear. Given course doxy and pred taper. Resp therapy home eval/treat  - Spirometry 09/16/2018  FEV1 0.60  (23%)  Ratio 0.33 at [facility]   He is rapidly approaching end-stage disease and needs to strongly consider hospice approach.  Since there is nothing else to offer I will see him back in 3 months sooner if needed.

## 2019-02-12 NOTE — Progress Notes (Signed)
LMTCB

## 2019-02-14 DIAGNOSIS — J449 Chronic obstructive pulmonary disease, unspecified: Secondary | ICD-10-CM | POA: Diagnosis not present

## 2019-02-15 ENCOUNTER — Ambulatory Visit: Payer: Medicare Other | Attending: Internal Medicine

## 2019-02-15 DIAGNOSIS — Z23 Encounter for immunization: Secondary | ICD-10-CM | POA: Insufficient documentation

## 2019-02-15 NOTE — Progress Notes (Signed)
   Covid-19 Vaccination Clinic  Name:  KRZYSZTOF MA    MRN: [id number] DOB: Feb 14, 1929  02/15/2019  Mr. Sparhawk was observed post Covid-19 immunization for 15 minutes without incidence. He was provided with Vaccine Information Sheet and instruction to access the V-Safe system.   Mr. Luevanos was instructed to call 911 with any severe reactions post vaccine: Marland Kitchen Difficulty breathing  . Swelling of your face and throat  . A fast heartbeat  . A bad rash all over your body  . Dizziness and weakness    Immunizations Administered    Name Date Dose VIS Date Route   Pfizer COVID-19 Vaccine 02/15/2019  2:18 PM 0.3 mL 12/20/2018 Intramuscular   Manufacturer: [place]   Lot: [id number]   [place]: [id number]

## 2019-02-24 ENCOUNTER — Ambulatory Visit: Payer: Self-pay

## 2019-02-25 ENCOUNTER — Other Ambulatory Visit: Payer: Self-pay | Admitting: Internal Medicine

## 2019-02-25 MED ORDER — PREDNISONE 10 MG PO TABS: ORAL_TABLET | ORAL | 2 refills | Status: DC

## 2019-03-04 ENCOUNTER — Other Ambulatory Visit: Payer: Self-pay

## 2019-03-04 ENCOUNTER — Telehealth: Payer: Self-pay | Admitting: Interventional Cardiology

## 2019-03-04 ENCOUNTER — Other Ambulatory Visit: Payer: Medicare Other | Admitting: *Deleted

## 2019-03-04 DIAGNOSIS — Z515 Encounter for palliative care: Secondary | ICD-10-CM

## 2019-03-04 NOTE — Telephone Encounter (Signed)
Pt has been on Azithromycin for 3 months and seen doctor at [facility] who recommended pt have an EKG done to assess T wave.  Pt also seen Dr. [name] who wanted pt to come to the office to get fluid assessed.  Has swelling in lower extremities.  No availability at [facility] office.  Scheduled pt to see Almyra Deforest, PA-C tomorrow at our [facility] office.  Wife appreciative for call.

## 2019-03-04 NOTE — Telephone Encounter (Signed)
Patient's wife, [name], is requesting an order for an EKG per the patient's pulmonologist. [name] states that the patient's pulmonologist recently prescribed azithromycin (ZITHROMAX) 250 MG tablet medication for the patient and medication side effects should be evaluated with an EKG. The pulmonologist is also requesting an evaluation of the patient's fluid medication as well. Please call.

## 2019-03-05 ENCOUNTER — Encounter: Payer: Self-pay | Admitting: Physician Assistant

## 2019-03-05 ENCOUNTER — Ambulatory Visit (INDEPENDENT_AMBULATORY_CARE_PROVIDER_SITE_OTHER): Payer: Medicare Other | Admitting: Physician Assistant

## 2019-03-05 ENCOUNTER — Other Ambulatory Visit: Payer: Self-pay

## 2019-03-05 VITALS — BP 132/80 | HR 96 | Temp 98.2°F | Wt 139.0 lb

## 2019-03-05 DIAGNOSIS — J449 Chronic obstructive pulmonary disease, unspecified: Secondary | ICD-10-CM | POA: Diagnosis not present

## 2019-03-05 DIAGNOSIS — I10 Essential (primary) hypertension: Secondary | ICD-10-CM

## 2019-03-05 DIAGNOSIS — E785 Hyperlipidemia, unspecified: Secondary | ICD-10-CM | POA: Diagnosis not present

## 2019-03-05 DIAGNOSIS — I5033 Acute on chronic diastolic (congestive) heart failure: Secondary | ICD-10-CM

## 2019-03-05 DIAGNOSIS — I480 Paroxysmal atrial fibrillation: Secondary | ICD-10-CM

## 2019-03-05 DIAGNOSIS — Z79899 Other long term (current) drug therapy: Secondary | ICD-10-CM

## 2019-03-05 DIAGNOSIS — I25709 Atherosclerosis of coronary artery bypass graft(s), unspecified, with unspecified angina pectoris: Secondary | ICD-10-CM | POA: Diagnosis not present

## 2019-03-05 NOTE — Progress Notes (Signed)
COMMUNITY PALLIATIVE CARE RN NOTE  PATIENT NAME: [name] DOB: May 02, 1929 MRN: [id number]  PRIMARY CARE PROVIDER: [name], MD  RESPONSIBLE PARTY: Kirklan Bueno (wife) Acct ID - Guarantor Home Phone Work Phone Relationship Acct Type  [id number] - [place] [name] [phone]  Self P/F     [address], [name], [place] [address]   Due to the COVID-19 crisis, this virtual check-in visit was done via telephone from my office and it was initiated and consent by this patient and or family.  PLAN OF CARE and INTERVENTION:  1. ADVANCE CARE PLANNING/GOALS OF CARE: Goal is for patient to remain at home with his wife. He is a Full code. 2. PATIENT/CAREGIVER EDUCATION: Symptom management 3. DISEASE STATUS: Virtual check-in visit completed via telephone. Patient reports that his overall condition is deteriorating. He is experiencing increased arthritic pain in his shoulders and back. Wife states that she is going to pick up some Aspirin to help with this pain. She feels that it is better for elderly patients and less harsh on his liver compared to Tylenol. He continues with dyspnea with exertion and is on continuous oxygen at 3L/min. He uses a Copy during the night. He is currently on Azithromycin for his chronic sinus issues and has been on this for about 15 days. He is not sure if it has made much difference in his sinus pressure as of yet. He saw a physician at [facility], who recommended that he stay on this antibiotic for 3 months. He is becoming progressively weaker. Wife states that he is no longer able to pick up a gallon of milk to pour him a drink. He is also having more difficulty walking up and down the stairs. He is no longer taking showers d/t weakness and fatigue. Wife is assisting him with bed baths. He continues with persistent bilateral lower extremity edema. He is seeing a PA at his Cardiology office tomorrow. He also has an appointment with his ENT this week, who will also be  doing a hearing evaluation. Will continue to monitor.  HISTORY OF PRESENT ILLNESS: This is a 86 yo male with a diagnosis of CHF and COPD. Palliative care continues to follow patient. Will continue to visit patient monthly and PRN.   CODE STATUS: Full code ADVANCED DIRECTIVES: Y MOST FORM: no PPS: weak 50%   (Duration of visit and documentation 30 minutes)   [name], RN BSN

## 2019-03-05 NOTE — Progress Notes (Signed)
Cardiology Office Note:    Date:  03/07/2019   ID:  [name], DOB 07-31-1929, MRN [id number]  PCP:  Travis Cruel, MD  Cardiologist:  Travis Grooms, MD  Electrophysiologist:  None   Referring MD: Travis Cruel, MD   Chief Complaint  Patient presents with  . Follow-up    seen for Dr. Tamala [name], dyspnea    History of Present Illness:    [name] is a 86 y.o. male with a hx of CAD s/p CABG, COPD with cor pulmonale, HTN, HLD, PAF, PAD and carotid artery disease.  Echocardiogram obtained on 03/09/2017 showed EF 60 to 65%, grade 1 DD mild TR, peak PA pressure 24 mmHg.  Echocardiogram obtained on 10/08/2018 showed EF >55%, bicuspid aortic valve without significant aortic valve stenosis, limited acoustic windows, RVSP 35-40 mmHg, elevated right atrial pressure.  He was noted to be in atrial fibrillation throughout the exam with episodes of rapid ventricular response.  Carotid Doppler obtained in November showed mild disease bilaterally.  To further assess possibility of atrial fibrillation, 4-week heart monitor was obtained in November that showed nonsustained VT up to 8 seconds, PACs, frequent PVCs, however no atrial fibrillation was noted.  The study was discussed with [name] who felt EP service did not have much to offer.  He is currently on palliative care.  Spironolactone was added to his medical regimen on 02/11/2019 by [name].  Since then, patient was seen by his pulmonologist at [facility] [name] who recommended increasing his Lasix to 20 mg twice daily.  He has been taking the 20 mg twice daily dosing of Lasix for the past 4 days.  I will obtain a basic metabolic panel.  He still occasionally has some chest discomfort however does not know if it is angina or related to his breathing issue.  For the past several month, he has been noticing worsening dyspnea on exertion.  He is currently in palliative care.  On physical exam, he has at least 3+ pitting edema involving both his  feet, ankle and the distal extremity.  I agree with pulmonology recommendation to increase his Lasix to 20 mg twice daily.  He will need to keep a weight diary for the next 2 weeks before return for follow-up.    Past Medical History:  Diagnosis Date  . Arthritis    "generalized" (04/04/2017)  . Asthma   . Basal cell carcinoma    "left ear"  . BPH (benign prostatic hyperplasia)    severe; s/p multiple biopsies  . CAD (coronary artery disease)   . Carotid artery occlusion   . Chronic respiratory failure ([place])   . Chronic rhinitis   . COPD (chronic obstructive pulmonary disease) (HCC)    2L [place] O2  . Diastolic heart failure ([place]) 2019  . Dilation of biliary tract   . Elevated troponin 03/09/2017  . Gallstones   . GERD (gastroesophageal reflux disease)   . History of blood transfusion    "w/his CABG" (04/04/2017)  . History of kidney stones   . Hyperlipidemia   . Hypertension   . On home oxygen therapy    "~ 24/7" (04/04/2017)  . Peripheral vascular disease ([place])   . Pneumonia 2019  . Syncope and collapse   . Ureteral tumor 08/2015   had endoscopic procedure for evaluation, unable to reach for biopsy    Past Surgical History:  Procedure Laterality Date  . BASAL CELL CARCINOMA EXCISION Left    ear  .  CARDIAC CATHETERIZATION     "prior to bypass"  . CATARACT EXTRACTION W/ INTRAOCULAR LENS  IMPLANT, BILATERAL Bilateral   . CORONARY ARTERY BYPASS GRAFT  10/2001   LIMA to LAD, SVG to OM1-2, SVG to RCA and PDA  . LITHOTRIPSY    . PROSTATE BIOPSY  Oct. 2014  . TONSILLECTOMY      Current Medications: Current Meds  Medication Sig  . azithromycin (ZITHROMAX) 250 MG tablet Take by mouth.  . famotidine (PEPCID) 20 MG tablet Take by mouth.  . furosemide (LASIX) 20 MG tablet Take 20 mg by mouth 2 (two) times daily.     Allergies:   Cefdinir, Nitrofurantoin, Sulfa antibiotics, Sulfonamide derivatives, Cefdinir, Ciprofloxacin, Nitrofurantoin, Amoxicillin er, Doxycycline,  Sertraline, Ciprofloxacin, Levaquin [levofloxacin], and Levaquin [levofloxacin]   Social History   Socioeconomic History  . Marital status: Married    Spouse name: Not on file  . Number of children: 2  . Years of education: Not on file  . Highest education level: Not on file  Occupational History  . 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 Smoking status: Former Smoker    Packs/day: 2.00    Years: 33.00    Pack years: 66.00    Types: Cigarettes    Quit date: 03/25/1982    Years since quitting: 36.9  . Smokeless tobacco: Never Used  Substance and Sexual Activity  . Alcohol use: Yes    Alcohol/week: 0.0 standard drinks    Comment: 1.5-3 oz daily in the past, minimal use in the last few months  . Drug use: No  . Sexual activity: Not on file  Other Topics Concern  . Not on file  Social History Narrative   ** Merged History Encounter **       Pt lives at home with his spouse.         Social Determinants of Health   Financial Resource Strain:   . Difficulty of Paying Living Expenses: Not on file  Food Insecurity:   . Worried About Charity fundraiser in the Last Year: Not on file  . Ran Out of Food in the Last Year: Not on file  Transportation Needs:   . Lack of Transportation (Medical): Not on file  . Lack of Transportation (Non-Medical): Not on file  Physical Activity:   . Days of Exercise per Week: Not on file  . Minutes of Exercise per Session: Not on file  Stress:   . Feeling of Stress : Not on file  Social Connections:   . Frequency of Communication with Friends and Family: Not on file  . Frequency of Social Gatherings with Friends and Family: Not on file  . Attends Religious Services: Not on file  . Active Member of Clubs or Organizations: Not on file  . Attends Archivist Meetings: Not on file  . Marital Status: Not on file     Family History: The patient's family history includes Allergies in his  brother; Breast cancer in his mother; Cancer in his mother; Heart attack in his father; Heart disease in his brother, brother, father, and mother; Hyperlipidemia in his brother; Hypertension in his brother and mother; Other in his mother; Peripheral vascular disease in his brother; Stroke in his brother.  ROS:   Please see the history of present illness.     All other systems reviewed and are negative.  EKGs/Labs/Other Studies Reviewed:    The following studies were reviewed today:  Cardiac  event 12/12/2018  NSR  Frequent PVC's  NSVT up tp 8 secnonds  Blocked PAC's and conducted PAC's  Poor correlation between c/o dizziness, light headedness, and syncope with atthythmia.   Abnormal study with VT Will get EP opinion. Already on beta blocker.   EKG:  EKG is ordered today.   Recent Labs: 07/10/2018: NT-Pro BNP 648 12/20/2018: Hemoglobin 13.1; Magnesium 2.4; Platelets 230 03/05/2019: ALT 23; BUN 22; Creatinine, Ser 1.16; Potassium 4.1; Sodium 139  Recent Lipid Panel    Component Value Date/Time   CHOL 146 03/26/2017 0621   TRIG 34 03/26/2017 0621   HDL 79 03/26/2017 0621   CHOLHDL 1.8 03/26/2017 0621   VLDL 7 03/26/2017 0621   LDLCALC 60 03/26/2017 0621    Physical Exam:    VS:  BP 132/80   Pulse 96   Temp 98.2 F (36.8 C)   Wt 139 lb (63 kg)   SpO2 95%   BMI 20.53 kg/m     Wt Readings from Last 3 Encounters:  03/05/19 139 lb (63 kg)  02/11/19 138 lb (62.6 kg)  12/23/18 137 lb 12.8 oz (62.5 kg)     GEN:  Well nourished, well developed in no acute distress HEENT: Normal NECK: No JVD; No carotid bruits LYMPHATICS: No lymphadenopathy CARDIAC: RRR, no murmurs, rubs, gallops RESPIRATORY: Diminished breath sound bilaterally ABDOMEN: Soft, non-tender, non-distended MUSCULOSKELETAL:  No edema; No deformity  SKIN: Warm and dry NEUROLOGIC:  Alert and oriented x 3 PSYCHIATRIC:  Normal affect   ASSESSMENT:    1. Acute on chronic diastolic (congestive) heart  failure ([place])   2. Medication management   3. Coronary artery disease involving coronary bypass graft of native heart with angina pectoris ([place])   4. Chronic obstructive pulmonary disease, unspecified COPD type ([place])   5. Essential hypertension   6. Hyperlipidemia LDL goal <70   7. PAF (paroxysmal atrial fibrillation) (HCC)    PLAN:    In order of problems listed above:  1. Acute on chronic diastolic heart failure: He is clearly volume overloaded with 3+ lower extremity pitting edema.  He was started on 20 mg twice daily of Lasix 4 days ago, I agree with the recommendation.  We will continue on the current therapy and have him return in 2 weeks for reassessment.  He will need a complete metabolic panel to check his renal function, electrolytes and albumin level  2. CAD status post CABG: He does have some intermittent chest discomfort, he is not clear if this is angina or related to his breathing.  He would not be a good candidate for ischemic work-up given end-stage COPD and palliative care status  3. COPD: End-stage.  Oxygen dependent  4. Hypertension: Blood pressure stable  5. Hyperlipidemia: Continue statin therapy  6. PAF: Continue metoprolol.  Not a candidate for anticoagulation therapy   Medication Adjustments/Labs and Tests Ordered: Current medicines are reviewed at length with the patient today.  Concerns regarding medicines are outlined above.  Orders Placed This Encounter  Procedures  . Comprehensive metabolic panel  . EKG 12-Lead   No orders of the defined types were placed in this encounter.   Patient Instructions  Medication Instructions:  Continue current medications, including lasix 20mg  twice daily  *If you need a refill on your cardiac medications before your next appointment, please call your pharmacy*  Lab Work: CMET today   If you have labs (blood work) drawn today and your tests are completely normal, you will receive your results only  by: . MyChart  Message (if you have MyChart) OR . A paper copy in the mail If you have any lab test that is abnormal or we need to change your treatment, we will call you to review the results.  Testing/Procedures: NONE  Follow-Up: At [facility], you and your health needs are our priority.  As part of our continuing mission to provide you with exceptional heart care, we have created designated Provider Care Teams.  These Care Teams include your primary Cardiologist (physician) and Advanced Practice Providers (APPs -  Physician Assistants and Nurse Practitioners) who all work together to provide you with the care you need, when you need it.  Your next appointment:   1-2 weeks   The format for your next appointment:   In Person  Provider:   Almyra Deforest, PA-C  Other Instructions  Keep a log of your daily weights and bring to next appointment     Signed, Almyra Deforest, [place]  03/07/2019 11:53 PM    [place]

## 2019-03-05 NOTE — Patient Instructions (Signed)
Medication Instructions:  Continue current medications, including lasix 20mg  twice daily  *If you need a refill on your cardiac medications before your next appointment, please call your pharmacy*  Lab Work: CMET today   If you have labs (blood work) drawn today and your tests are completely normal, you will receive your results only by: Marland Kitchen MyChart Message (if you have MyChart) OR . A paper copy in the mail If you have any lab test that is abnormal or we need to change your treatment, we will call you to review the results.  Testing/Procedures: NONE  Follow-Up: At [facility], you and your health needs are our priority.  As part of our continuing mission to provide you with exceptional heart care, we have created designated Provider Care Teams.  These Care Teams include your primary Cardiologist (physician) and Advanced Practice Providers (APPs -  Physician Assistants and Nurse Practitioners) who all work together to provide you with the care you need, when you need it.  Your next appointment:   1-2 weeks   The format for your next appointment:   In Person  Provider:   Almyra Deforest, PA-C  Other Instructions  Keep a log of your daily weights and bring to next appointment

## 2019-03-06 DIAGNOSIS — H838X3 Other specified diseases of inner ear, bilateral: Secondary | ICD-10-CM | POA: Diagnosis not present

## 2019-03-06 DIAGNOSIS — J310 Chronic rhinitis: Secondary | ICD-10-CM | POA: Diagnosis not present

## 2019-03-06 DIAGNOSIS — H6123 Impacted cerumen, bilateral: Secondary | ICD-10-CM | POA: Diagnosis not present

## 2019-03-06 DIAGNOSIS — J342 Deviated nasal septum: Secondary | ICD-10-CM | POA: Diagnosis not present

## 2019-03-06 DIAGNOSIS — H903 Sensorineural hearing loss, bilateral: Secondary | ICD-10-CM | POA: Diagnosis not present

## 2019-03-06 DIAGNOSIS — J343 Hypertrophy of nasal turbinates: Secondary | ICD-10-CM | POA: Diagnosis not present

## 2019-03-06 LAB — COMPREHENSIVE METABOLIC PANEL
ALT: 23 IU/L (ref 0–44)
AST: 27 IU/L (ref 0–40)
Albumin/Globulin Ratio: 2.4 — ABNORMAL HIGH (ref 1.2–2.2)
Albumin: 4.4 g/dL (ref 3.6–4.6)
Alkaline Phosphatase: 68 IU/L (ref 39–117)
BUN/Creatinine Ratio: 19 (ref 10–24)
BUN: 22 mg/dL (ref 8–27)
Bilirubin Total: 0.5 mg/dL (ref 0.0–1.2)
CO2: 30 mmol/L — ABNORMAL HIGH (ref 20–29)
Calcium: 9.7 mg/dL (ref 8.6–10.2)
Chloride: 95 mmol/L — ABNORMAL LOW (ref 96–106)
Creatinine, Ser: 1.16 mg/dL (ref 0.76–1.27)
GFR calc Af Amer: 65 mL/min/{1.73_m2} (ref >59)
GFR calc non Af Amer: 56 mL/min/{1.73_m2} — ABNORMAL LOW (ref >59)
Globulin, Total: 1.8 g/dL (ref 1.5–4.5)
Glucose: 121 mg/dL — ABNORMAL HIGH (ref 65–99)
Potassium: 4.1 mmol/L (ref 3.5–5.2)
Sodium: 139 mmol/L (ref 134–144)
Total Protein: 6.2 g/dL (ref 6.0–8.5)

## 2019-03-07 ENCOUNTER — Encounter: Payer: Self-pay | Admitting: Physician Assistant

## 2019-03-07 NOTE — Progress Notes (Signed)
Normal renal function and electrolyte

## 2019-03-12 ENCOUNTER — Ambulatory Visit: Payer: Medicare Other | Attending: Internal Medicine

## 2019-03-12 DIAGNOSIS — F324 Major depressive disorder, single episode, in partial remission: Secondary | ICD-10-CM | POA: Diagnosis not present

## 2019-03-12 DIAGNOSIS — J449 Chronic obstructive pulmonary disease, unspecified: Secondary | ICD-10-CM | POA: Diagnosis not present

## 2019-03-12 DIAGNOSIS — Z23 Encounter for immunization: Secondary | ICD-10-CM | POA: Insufficient documentation

## 2019-03-12 DIAGNOSIS — I509 Heart failure, unspecified: Secondary | ICD-10-CM | POA: Diagnosis not present

## 2019-03-12 DIAGNOSIS — E782 Mixed hyperlipidemia: Secondary | ICD-10-CM | POA: Diagnosis not present

## 2019-03-12 DIAGNOSIS — N400 Enlarged prostate without lower urinary tract symptoms: Secondary | ICD-10-CM | POA: Diagnosis not present

## 2019-03-12 DIAGNOSIS — I10 Essential (primary) hypertension: Secondary | ICD-10-CM | POA: Diagnosis not present

## 2019-03-12 NOTE — Progress Notes (Signed)
   Covid-19 Vaccination Clinic  Name:  [name]    MRN: [id number] DOB: 1929/08/07  03/12/2019  [name] was observed post Covid-19 immunization for 15 minutes without incident. He was provided with Vaccine Information Sheet and instruction to access the V-Safe system.   [name] was instructed to call 911 with any severe reactions post vaccine: Marland Kitchen Difficulty breathing  . Swelling of face and throat  . A fast heartbeat  . A bad rash all over body  . Dizziness and weakness   Immunizations Administered    Name Date Dose VIS Date Route   Pfizer COVID-19 Vaccine 03/12/2019  3:45 PM 0.3 mL 12/20/2018 Intramuscular   Manufacturer: [place]   Lot: [id number]   [place]: [id number]

## 2019-03-24 ENCOUNTER — Ambulatory Visit: Payer: Medicare Other | Admitting: Internal Medicine

## 2019-03-27 ENCOUNTER — Other Ambulatory Visit: Payer: Self-pay

## 2019-03-27 ENCOUNTER — Ambulatory Visit (INDEPENDENT_AMBULATORY_CARE_PROVIDER_SITE_OTHER): Payer: Medicare Other | Admitting: Physician Assistant

## 2019-03-27 VITALS — BP 130/80 | HR 93 | Temp 97.3°F | Ht 69.0 in | Wt 139.2 lb

## 2019-03-27 DIAGNOSIS — M79606 Pain in leg, unspecified: Secondary | ICD-10-CM

## 2019-03-27 DIAGNOSIS — I10 Essential (primary) hypertension: Secondary | ICD-10-CM | POA: Diagnosis not present

## 2019-03-27 DIAGNOSIS — I2781 Cor pulmonale (chronic): Secondary | ICD-10-CM

## 2019-03-27 DIAGNOSIS — I25810 Atherosclerosis of coronary artery bypass graft(s) without angina pectoris: Secondary | ICD-10-CM | POA: Diagnosis not present

## 2019-03-27 DIAGNOSIS — E785 Hyperlipidemia, unspecified: Secondary | ICD-10-CM

## 2019-03-27 DIAGNOSIS — I5023 Acute on chronic systolic (congestive) heart failure: Secondary | ICD-10-CM

## 2019-03-27 DIAGNOSIS — I480 Paroxysmal atrial fibrillation: Secondary | ICD-10-CM

## 2019-03-27 NOTE — Progress Notes (Signed)
Cardiology Office Note:    Date:  03/29/2019   ID:  KILLION HOMRICH, DOB 07-15-1929, MRN [id number]  PCP:  [name], MD  Cardiologist:  Sinclair Grooms, MD  Electrophysiologist:  None   Referring MD: [name], MD   Chief Complaint  Patient presents with  . Follow-up    seen for Dr. Tamala Julian    History of Present Illness:    CORIAN HENSEL is a 86 y.o. male with a hx of CAD s/p CABG, COPD with cor pulmonale, HTN, HLD, PAF, PAD and carotid artery disease.  Echocardiogram obtained on 03/09/2017 showed EF 60 to 65%, grade 1 DD mild TR, peak PA pressure 24 mmHg.  Echocardiogram obtained on 10/08/2018 showed EF >55%, bicuspid aortic valve without significant aortic valve stenosis, limited acoustic windows, RVSP 35-40 mmHg, elevated right atrial pressure.  He was noted to be in atrial fibrillation throughout the exam with episodes of rapid ventricular response.  Carotid Doppler obtained in November showed mild disease bilaterally.  To further assess possibility of atrial fibrillation, 4-week heart monitor was obtained in November that showed nonsustained VT up to 8 seconds, PACs, frequent PVCs, however no atrial fibrillation was noted.  The study was discussed with Dr. Lovena Le who felt EP service did not have much to offer.  He is currently on palliative care.  Spironolactone was added to his medical regimen on 02/11/2019 by Dr. [name].  Since then, patient was seen by his pulmonologist at [facility] Dr. Annamaria Boots who recommended increasing his Lasix to 20 mg twice daily.  When I saw the patient back in February 2021, he was seen palliative care.  He had at least 3+ pitting edema involving both of his feet, ankle and the distal extremity.  Prior to our visit, pulmonology service has increased he is Lasix to 20 mg twice daily.  I continued him on that dose of diuretic.  I recommended him to keep a weight diary before following up in a few weeks.  He returned today for cardiology office visit.  Patient  presents today along with his wife.  He continues to have at least 2-3+ pitting edema in the dorsal aspect of both feet.  He is wearing compression stocking which helps.  He says he he has not been taking 20 mg twice daily dosing consistently as sometimes he either forget about the afternoon dose or he feels too weak to take the afternoon dose.  I recommend a basic metabolic panel today and magnesium to check his electrolyte and renal function.  I did recommend to reduce the Lasix to 20 mg a.m. every day and additional 20 mg in p.m. on Monday Wednesday and Friday.  Past Medical History:  Diagnosis Date  . Arthritis    "generalized" (04/04/2017)  . Asthma   . Basal cell carcinoma    "left ear"  . BPH (benign prostatic hyperplasia)    severe; s/p multiple biopsies  . CAD (coronary artery disease)   . Carotid artery occlusion   . Chronic respiratory failure ([employer])   . Chronic rhinitis   . COPD (chronic obstructive pulmonary disease) (HCC)    2L [place] O2  . Diastolic heart failure ([place]) 2019  . Dilation of biliary tract   . Elevated troponin 03/09/2017  . Gallstones   . GERD (gastroesophageal reflux disease)   . History of blood transfusion    "w/his CABG" (04/04/2017)  . History of kidney stones   . Hyperlipidemia   . Hypertension   .  On home oxygen therapy    "~ 24/7" (04/04/2017)  . Peripheral vascular disease ([place])   . Pneumonia 2019  . Syncope and collapse   . Ureteral tumor 08/2015   had endoscopic procedure for evaluation, unable to reach for biopsy    Past Surgical History:  Procedure Laterality Date  . BASAL CELL CARCINOMA EXCISION Left    ear  . CARDIAC CATHETERIZATION     "prior to bypass"  . CATARACT EXTRACTION W/ INTRAOCULAR LENS  IMPLANT, BILATERAL Bilateral   . CORONARY ARTERY BYPASS GRAFT  10/2001   LIMA to LAD, SVG to OM1-2, SVG to RCA and PDA  . LITHOTRIPSY    . PROSTATE BIOPSY  Oct. 2014  . TONSILLECTOMY      Current Medications: Current Meds  Medication Sig    . ALPRAZolam (XANAX) 0.25 MG tablet TAKE 1 TABLET BY MOUTH THREE TIMES DAILY AS NEEDED  . aspirin 81 MG tablet Take 81 mg by mouth daily.  Marland Kitchen atorvastatin (LIPITOR) 20 MG tablet TAKE 1 TABLET BY MOUTH EVERY DAY  . azithromycin (ZITHROMAX) 250 MG tablet Take by mouth as directed.  . betamethasone dipropionate 0.05 % cream Apply topically 2 (two) times daily. Apply to both feet twice daily for 4 weeks.  . Cholecalciferol (VITAMIN D3) 2000 units TABS Take 2,000 Units by mouth daily.  . famotidine (PEPCID) 20 MG tablet Take by mouth.  . finasteride (PROSCAR) 5 MG tablet Take 5 mg by mouth every morning.  . fluticasone (FLONASE) 50 MCG/ACT nasal spray Place 2 sprays into both nostrils daily.  . furosemide (LASIX) 20 MG tablet Take 20 mg by mouth daily. Take 20 mg daily and an additional 20 mg on MWF afternoons   . ipratropium (ATROVENT) 0.06 % nasal spray U 2 SPRAYS IEN TID  . levalbuterol (XOPENEX HFA) 45 MCG/ACT inhaler Inhale 2 puffs into the lungs every 4 (four) hours as needed for wheezing or shortness of breath.  . levalbuterol (XOPENEX) 0.63 MG/3ML nebulizer solution USE 1 VIAL VIA NEBULIZER EVERY 4 HOURS AS NEEDED FOR WHEEZING OR SHORTNESS OF BREATH  . metoprolol succinate (TOPROL-XL) 25 MG 24 hr tablet Take 1 tablet (25 mg total) by mouth daily.  . Multiple Vitamins-Minerals (PRESERVISION AREDS 2) CAPS Take 1 capsule by mouth daily.  . nitroGLYCERIN (NITROSTAT) 0.4 MG SL tablet Place 1 tablet (0.4 mg total) under the tongue every 5 (five) minutes as needed for chest pain.  . OXYGEN Inhale 2 L continuous into the lungs.   . predniSONE (DELTASONE) 10 MG tablet FOR FLARE OF COUGH/WHEEZING MAY INCREASE TO 2 DAILY BETTER, THEN 1 DAILY  . sodium chloride (MURO 128) 5 % ophthalmic ointment 1 drop.   Marland Kitchen spironolactone (ALDACTONE) 25 MG tablet TAKE 1/2 TABLET BY MOUTH EVERY DAY  . SYMBICORT 160-4.5 MCG/ACT inhaler Inhale 2 puffs into the lungs 2 (two) times daily.  . Tiotropium Bromide Monohydrate  (SPIRIVA RESPIMAT) 2.5 MCG/ACT AERS INHALE 2 PUFFS BY MOUTH EVERY DAY  . vitamin B-12 (CYANOCOBALAMIN) 100 MCG tablet Take 100 mcg by mouth daily.     Allergies:   Cefdinir, Nitrofurantoin, Sulfa antibiotics, Sulfonamide derivatives, Cefdinir, Ciprofloxacin, Nitrofurantoin, Amoxicillin er, Doxycycline, Sertraline, Ciprofloxacin, Levaquin [levofloxacin], and Levaquin [levofloxacin]   Social History   Socioeconomic History  . Marital status: Married    Spouse name: Not on file  . Number of children: 2  . Years of education: Not on file  . Highest education level: Not on file  Occupational History  . 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 Smoking status: Former Smoker    Packs/day: 2.00    Years: 33.00    Pack years: 66.00    Types: Cigarettes    Quit date: 03/25/1982    Years since quitting: 37.0  . Smokeless tobacco: Never Used  Substance and Sexual Activity  . Alcohol use: Yes    Alcohol/week: 0.0 standard drinks    Comment: 1.5-3 oz daily in the past, minimal use in the last few months  . Drug use: No  . Sexual activity: Not on file  Other Topics Concern  . Not on file  Social History Narrative   ** Merged History Encounter **       Pt lives at home with his spouse.         Social Determinants of Health   Financial Resource Strain:   . Difficulty of Paying Living Expenses:   Food Insecurity:   . Worried About Charity fundraiser in the Last Year:   . Arboriculturist in the Last Year:   Transportation Needs:   . Film/video editor (Medical):   Marland Kitchen Lack of Transportation (Non-Medical):   Physical Activity:   . Days of Exercise per Week:   . Minutes of Exercise per Session:   Stress:   . Feeling of Stress :   Social Connections:   . Frequency of Communication with Friends and Family:   . Frequency of Social Gatherings with Friends and Family:   . Attends Religious Services:   . Active Member of Clubs or  Organizations:   . Attends Archivist Meetings:   Marland Kitchen Marital Status:      Family History: The patient's family history includes Allergies in his brother; Breast cancer in his mother; Cancer in his mother; Heart attack in his father; Heart disease in his brother, brother, father, and mother; Hyperlipidemia in his brother; Hypertension in his brother and mother; Other in his mother; Peripheral vascular disease in his brother; Stroke in his brother.  ROS:   Please see the history of present illness.     All other systems reviewed and are negative.  EKGs/Labs/Other Studies Reviewed:    The following studies were reviewed today:  Echo 03/09/2017 LV EF: 60% -  65%  Study Conclusions   - Left ventricle: The cavity size was normal. Systolic function was  normal. The estimated ejection fraction was in the range of 60%  to 65%. Wall motion was normal; there were no regional wall  motion abnormalities. Doppler parameters are consistent with  abnormal left ventricular relaxation (grade 1 diastolic  dysfunction).  - Aortic valve: Transvalvular velocity was within the normal range.  There was no stenosis. There was no regurgitation.  - Mitral valve: Transvalvular velocity was within the normal range.  There was no evidence for stenosis. There was trivial  regurgitation.  - Right ventricle: The cavity size was normal. Wall thickness was  normal. Systolic function was reduced.  - Tricuspid valve: There was mild regurgitation.  - Pulmonary arteries: Systolic pressure was within the normal  range. PA peak pressure: 24 mm Hg (S).  - Pericardium, extracardiac: A trivial pericardial effusion was  identified.   EKG:  EKG is not ordered today.    Recent Labs: 07/10/2018: NT-Pro BNP 648 12/20/2018: Hemoglobin 13.1; Platelets 230 03/05/2019: ALT 23 03/27/2019: BUN 22; Creatinine, Ser 1.09; Magnesium 2.3; Potassium 4.8; Sodium 142  Recent Lipid Panel    Component Value  Date/Time   CHOL 146 03/26/2017 0621   TRIG 34 03/26/2017 0621   HDL 79 03/26/2017 0621   CHOLHDL 1.8 03/26/2017 0621   VLDL 7 03/26/2017 0621   LDLCALC 60 03/26/2017 0621    Physical Exam:    VS:  BP 130/80   Pulse 93   Temp (!) 97.3 F (36.3 C)   Ht 5\' 9"  (1.753 m)   Wt 139 lb 3.2 oz (63.1 kg)   SpO2 92%   BMI 20.56 kg/m     Wt Readings from Last 3 Encounters:  03/27/19 139 lb 3.2 oz (63.1 kg)  03/05/19 139 lb (63 kg)  02/11/19 138 lb (62.6 kg)     GEN:  Well nourished, well developed in no acute distress HEENT: Normal NECK: No JVD; No carotid bruits LYMPHATICS: No lymphadenopathy CARDIAC: RRR, no murmurs, rubs, gallops RESPIRATORY:  Clear to auscultation without rales, wheezing or rhonchi  ABDOMEN: Soft, non-tender, non-distended MUSCULOSKELETAL: No deformity  3+ dorsal pedal edema SKIN: Warm and dry NEUROLOGIC:  Alert and oriented x 3 PSYCHIATRIC:  Normal affect   ASSESSMENT:    1. Acute on chronic systolic heart failure ([place])   2. Coronary artery disease involving coronary bypass graft of native heart without angina pectoris   3. Cor pulmonale, chronic (HCC)/ clinical dx    4. Essential hypertension   5. Hyperlipidemia LDL goal <70   6. PAF (paroxysmal atrial fibrillation) (HCC)   7. Pain of lower extremity, unspecified laterality    PLAN:    In order of problems listed above:  1. Acute on chronic systolic heart failure: Any further up titration of diuretic causes him to be quite weak.  I recommend 20 mg daily of Lasix every morning and additional 20 mg Lasix in the afternoon on Monday Wednesday and Friday.  2. CAD s/p CABG:  On aspirin and Lipitor.  3. COPD with cor pulmonale: End-stage.  Oxygen dependent.  Currently in palliative care.  4. Hypertension: Blood pressure stable  5. Hyperlipidemia: On Lipitor  6. PAF: Continue metoprolol.  Not a candidate for anticoagulation therapy.  7. Leg pain: I am not clear if this is truly related to  electrolyte imbalance.  Obtain basic metabolic panel and magnesium.   Medication Adjustments/Labs and Tests Ordered: Current medicines are reviewed at length with the patient today.  Concerns regarding medicines are outlined above.  Orders Placed This Encounter  Procedures  . Basic metabolic panel  . Magnesium   No orders of the defined types were placed in this encounter.   Patient Instructions  Medication Instructions:   TAKE your Lasix 20 mg Daily and on MONDAYS, WEDNESDAYS AND FRIDAYS take an additional dose of Lasix 20 mg in the afternoons *If you need a refill on your cardiac medications before your next appointment, please call your pharmacy*  Lab Work: Your physician recommends that you return for lab work in: [place] If you have labs (blood work) drawn today and your tests are completely normal, you will receive your results only by: Marland Kitchen MyChart Message (if you have MyChart) OR . A paper copy in the mail If you have any lab test that is abnormal or we need to change your treatment, we will call you to review the results.  Testing/Procedures: NONE ordered at this time of appointment   Follow-Up: At [facility], you and your health needs are our priority.  As part of our continuing mission to provide you with exceptional heart care, we have  created designated Provider Care Teams.  These Care Teams include your primary Cardiologist (physician) and Advanced Practice Providers (APPs -  Physician Assistants and Nurse Practitioners) who all work together to provide you with the care you need, when you need it.  We recommend signing up for the patient portal called "MyChart".  Sign up information is provided on this After Visit Summary.  MyChart is used to connect with patients for Virtual Visits (Telemedicine).  Patients are able to view lab/test results, encounter notes, upcoming appointments, etc.  Non-urgent messages can be sent to your provider as well.   To  learn more about what you can do with MyChart, go to [email or web address].    Your next appointment:   2-3 month(s)  The format for your next appointment:   In Person  Provider:   Daneen Schick, MD  Other Instructions      Signed, Almyra Deforest, [facility]  03/29/2019 11:53 PM    [place]

## 2019-03-27 NOTE — Patient Instructions (Signed)
Medication Instructions:   TAKE your Lasix 20 mg Daily and on MONDAYS, WEDNESDAYS AND FRIDAYS take an additional dose of Lasix 20 mg in the afternoons *If you need a refill on your cardiac medications before your next appointment, please call your pharmacy*  Lab Work: Your physician recommends that you return for lab work in: [place] If you have labs (blood work) drawn today and your tests are completely normal, you will receive your results only by: Marland Kitchen MyChart Message (if you have MyChart) OR . A paper copy in the mail If you have any lab test that is abnormal or we need to change your treatment, we will call you to review the results.  Testing/Procedures: NONE ordered at this time of appointment   Follow-Up: At [facility], you and your health needs are our priority.  As part of our continuing mission to provide you with exceptional heart care, we have created designated Provider Care Teams.  These Care Teams include your primary Cardiologist (physician) and Advanced Practice Providers (APPs -  Physician Assistants and Nurse Practitioners) who all work together to provide you with the care you need, when you need it.  We recommend signing up for the patient portal called "MyChart".  Sign up information is provided on this After Visit Summary.  MyChart is used to connect with patients for Virtual Visits (Telemedicine).  Patients are able to view lab/test results, encounter notes, upcoming appointments, etc.  Non-urgent messages can be sent to your provider as well.   To learn more about what you can do with MyChart, go to [email or web address].    Your next appointment:   2-3 month(s)  The format for your next appointment:   In Person  Provider:   Daneen Schick, MD  Other Instructions

## 2019-03-28 LAB — BASIC METABOLIC PANEL
BUN/Creatinine Ratio: 20 (ref 10–24)
BUN: 22 mg/dL (ref 8–27)
CO2: 31 mmol/L — ABNORMAL HIGH (ref 20–29)
Calcium: 9.7 mg/dL (ref 8.6–10.2)
Chloride: 98 mmol/L (ref 96–106)
Creatinine, Ser: 1.09 mg/dL (ref 0.76–1.27)
GFR calc Af Amer: 70 mL/min/{1.73_m2} (ref >59)
GFR calc non Af Amer: 61 mL/min/{1.73_m2} (ref >59)
Glucose: 123 mg/dL — ABNORMAL HIGH (ref 65–99)
Potassium: 4.8 mmol/L (ref 3.5–5.2)
Sodium: 142 mmol/L (ref 134–144)

## 2019-03-28 LAB — MAGNESIUM: Magnesium: 2.3 mg/dL (ref 1.6–2.3)

## 2019-03-28 NOTE — Progress Notes (Signed)
Kidney function and electrolyte normal. Both potassium and magnesium are normal. Lower extremity pain not caused by electrolyte issue

## 2019-03-29 ENCOUNTER — Encounter: Payer: Self-pay | Admitting: Physician Assistant

## 2019-03-31 IMAGING — CR DG CHEST 2V
3 series · 3 of 3 positions shown · non-contrast
Comparison: Radiographs and CT 03/08/2017

CLINICAL DATA: Chest pain.

EXAM:
CHEST - 2 VIEW

[chest lat]
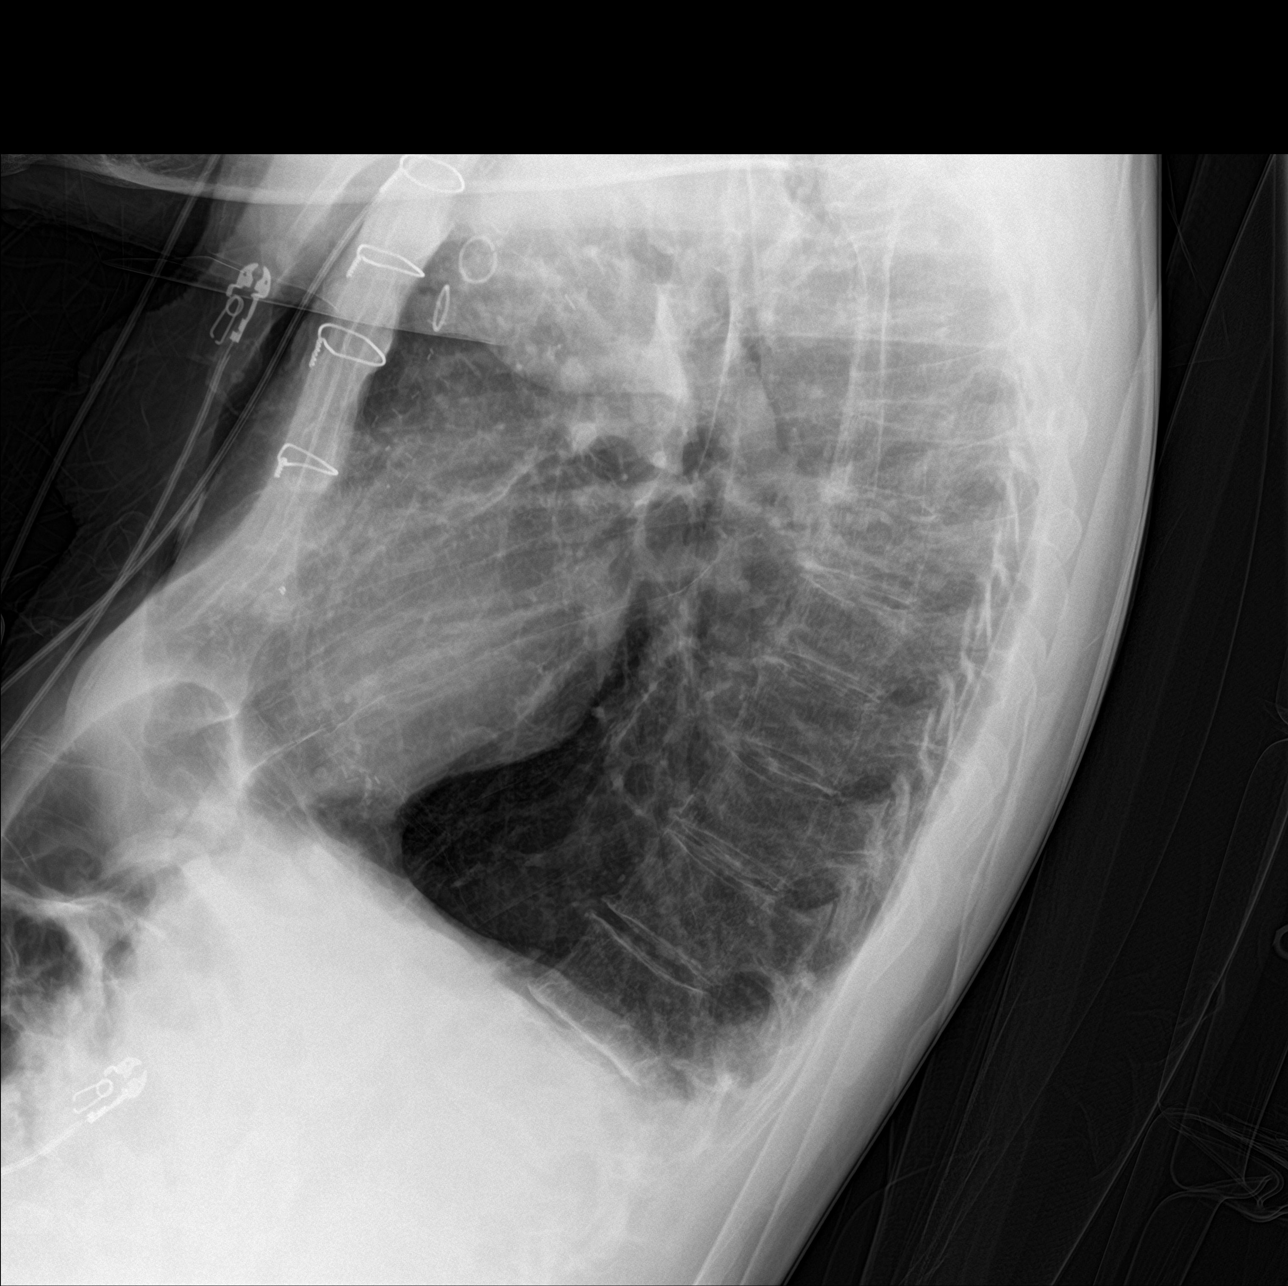

[chest ap (1 of 2)]
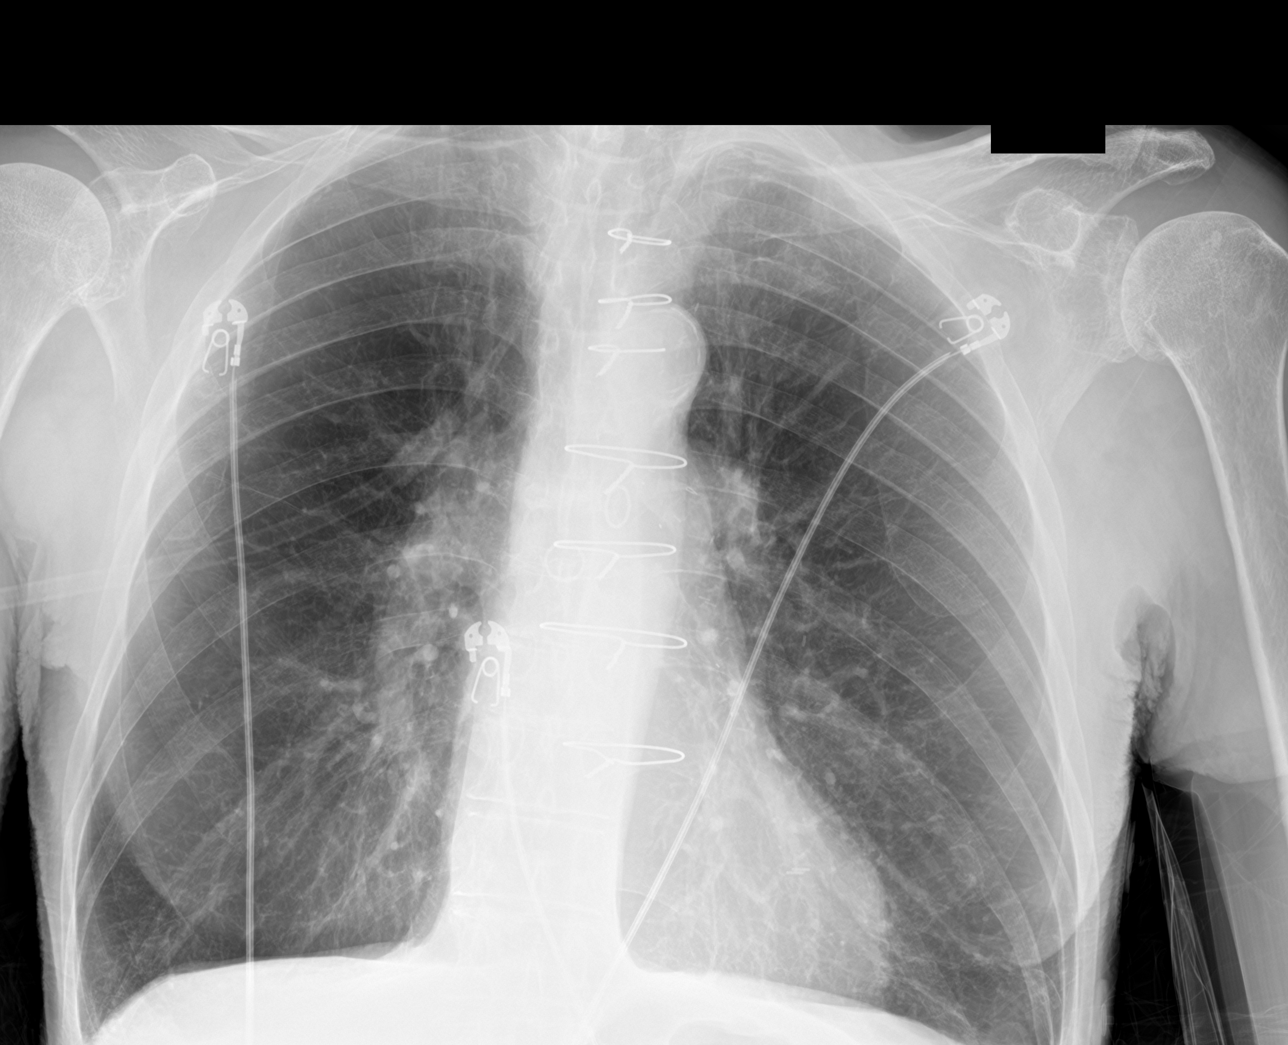

[chest ap (2 of 2)]
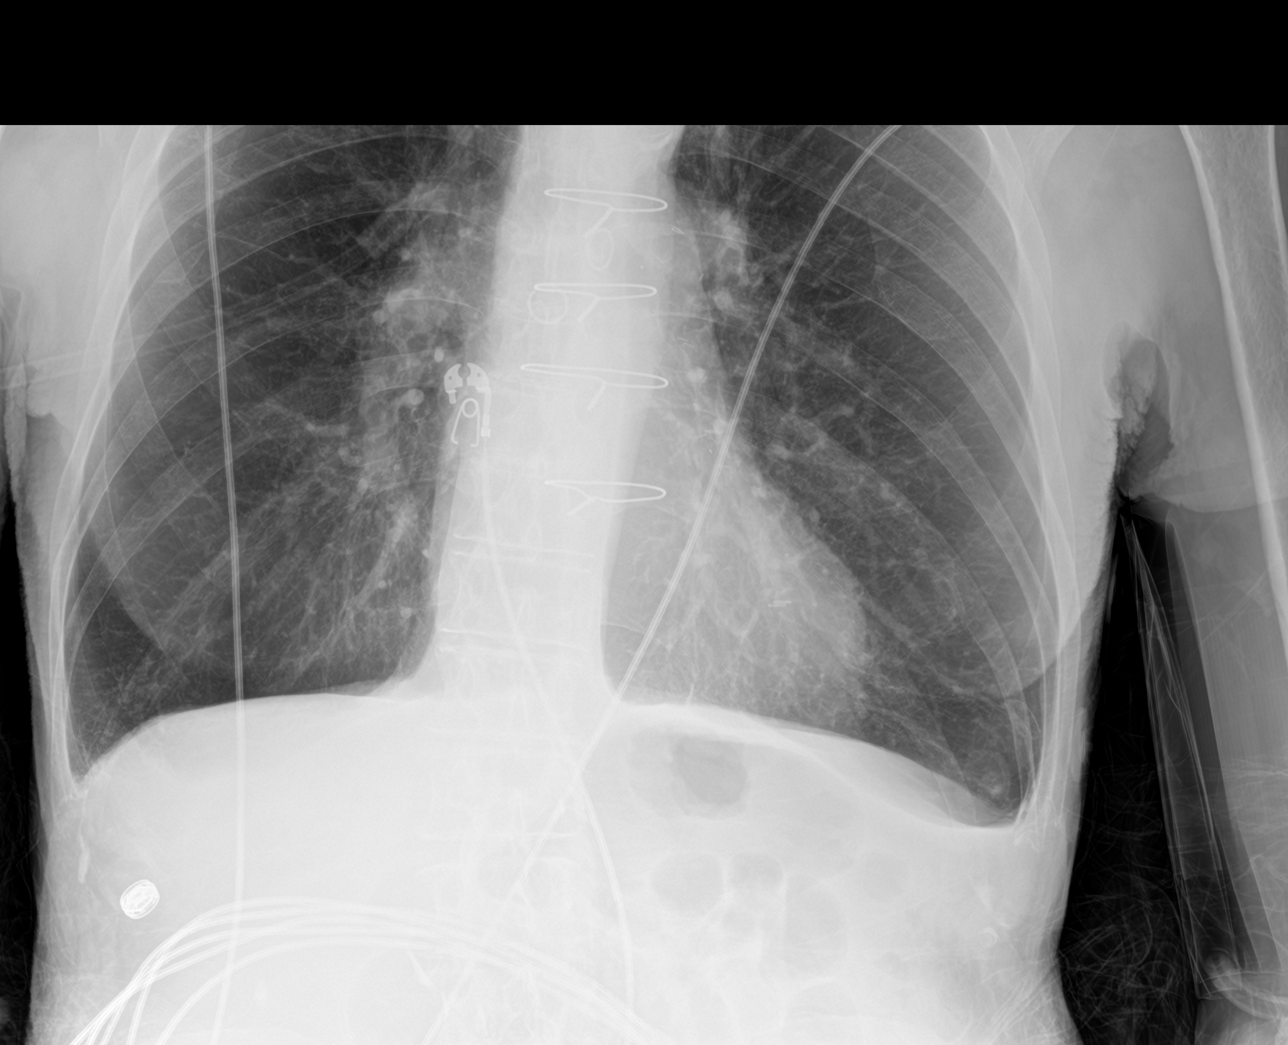

[3 of 3 positions shown; findings below may reference images not displayed]

FINDINGS: Post median sternotomy. Chronic hyperinflation and emphysema. Normal
heart size and mediastinal contours. No pulmonary edema, pleural
effusion or pneumothorax. No focal airspace disease. Degenerative
change of both shoulders.
IMPRESSION: 1. No acute abnormality.
2. Emphysema and chronic hyperinflation, unchanged.

## 2019-04-03 ENCOUNTER — Telehealth: Payer: Self-pay | Admitting: *Deleted

## 2019-04-03 NOTE — Telephone Encounter (Signed)
Returned call to patient's wife, [name]. She is concerned that patient is continuing to decline and feels that she needs more personal assistance with patient at home and also some guidance. Home visit scheduled for 04/07/19 at 3:00pm.

## 2019-04-07 ENCOUNTER — Telehealth: Payer: Self-pay

## 2019-04-07 ENCOUNTER — Ambulatory Visit (INDEPENDENT_AMBULATORY_CARE_PROVIDER_SITE_OTHER): Payer: Medicare Other

## 2019-04-07 ENCOUNTER — Other Ambulatory Visit: Payer: Medicare Other | Admitting: *Deleted

## 2019-04-07 ENCOUNTER — Other Ambulatory Visit: Payer: Self-pay

## 2019-04-07 ENCOUNTER — Ambulatory Visit (INDEPENDENT_AMBULATORY_CARE_PROVIDER_SITE_OTHER): Payer: Medicare Other | Admitting: Podiatry

## 2019-04-07 ENCOUNTER — Other Ambulatory Visit: Payer: Self-pay | Admitting: Podiatry

## 2019-04-07 DIAGNOSIS — L309 Dermatitis, unspecified: Secondary | ICD-10-CM

## 2019-04-07 DIAGNOSIS — M79671 Pain in right foot: Secondary | ICD-10-CM

## 2019-04-07 DIAGNOSIS — Z515 Encounter for palliative care: Secondary | ICD-10-CM

## 2019-04-07 DIAGNOSIS — I739 Peripheral vascular disease, unspecified: Secondary | ICD-10-CM | POA: Diagnosis not present

## 2019-04-07 NOTE — Telephone Encounter (Signed)
Pt called with c/o foot/leg swelling and "intense" pain that comes and goes but is worse at night. He is going to come in for office visit Thursday and will call back if anything changes/worsens between now and his appt.

## 2019-04-08 ENCOUNTER — Encounter: Payer: Self-pay | Admitting: Podiatry

## 2019-04-08 NOTE — Progress Notes (Signed)
Subjective:  Patient ID: [name], male    DOB: December 11, 1929,  MRN: [id number]  Chief Complaint  Patient presents with  . Foot Pain    pt is here for right foot pain, pt states that the right foot toes 1-5 is elevated when applying pressure. pt states that pain is elevated when walking on it.    86 y.o. male presents with the above complaint.  Patient presents with right foot pain/redness/rubor.  Patient states the pain is across the first through fifth digit with right side being worse than left side.  Patient states been going for 4 months pain is elevated at nighttime.  Patient has tried certain creams but has not helped much.  Patient is worried that this not getting resolved and is concerned for any kind of acute infection.  I explained the patient that this does not appear to be infectious in nature.  It appears to be primarily localized to the skin itself without any underlying musculoskeletal issue.  Patient states understanding.  He has not seen anyone else for this.  His primary care physician prescribed betamethasone cream which has been helping.   Review of Systems: Negative except as noted in the HPI. Denies N/V/F/Ch.  Past Medical History:  Diagnosis Date  . Arthritis    "generalized" (04/04/2017)  . Asthma   . Basal cell carcinoma    "left ear"  . BPH (benign prostatic hyperplasia)    severe; s/p multiple biopsies  . CAD (coronary artery disease)   . Carotid artery occlusion   . Chronic respiratory failure ([place])   . Chronic rhinitis   . COPD (chronic obstructive pulmonary disease) (HCC)    2L [place] O2  . Diastolic heart failure ([place]) 2019  . Dilation of biliary tract   . Elevated troponin 03/09/2017  . Gallstones   . GERD (gastroesophageal reflux disease)   . History of blood transfusion    "w/his CABG" (04/04/2017)  . History of kidney stones   . Hyperlipidemia   . Hypertension   . On home oxygen therapy    "~ 24/7" (04/04/2017)  . Peripheral vascular disease ([place])    . Pneumonia 2019  . Syncope and collapse   . Ureteral tumor 08/2015   had endoscopic procedure for evaluation, unable to reach for biopsy    Current Outpatient Medications:  .  ALPRAZolam (XANAX) 0.25 MG tablet, TAKE 1 TABLET BY MOUTH THREE TIMES DAILY AS NEEDED, Disp: 90 tablet, Rfl: 3 .  aspirin 81 MG tablet, Take 81 mg by mouth daily., Disp: , Rfl:  .  atorvastatin (LIPITOR) 20 MG tablet, TAKE 1 TABLET BY MOUTH EVERY DAY, Disp: 90 tablet, Rfl: 2 .  azithromycin (ZITHROMAX) 250 MG tablet, Take by mouth as directed., Disp: , Rfl:  .  betamethasone dipropionate 0.05 % cream, Apply topically 2 (two) times daily. Apply to both feet twice daily for 4 weeks., Disp: 45 g, Rfl: 1 .  Cholecalciferol (VITAMIN D3) 2000 units TABS, Take 2,000 Units by mouth daily., Disp: , Rfl:  .  famotidine (PEPCID) 20 MG tablet, Take by mouth., Disp: , Rfl:  .  finasteride (PROSCAR) 5 MG tablet, Take 5 mg by mouth every morning., Disp: , Rfl:  .  fluticasone (FLONASE) 50 MCG/ACT nasal spray, Place 2 sprays into both nostrils daily., Disp: , Rfl:  .  furosemide (LASIX) 20 MG tablet, Take 20 mg by mouth daily. Take 20 mg daily and an additional 20 mg on MWF afternoons , Disp: , Rfl:  .  ipratropium (ATROVENT) 0.06 % nasal spray, U 2 SPRAYS IEN TID, Disp: , Rfl:  .  levalbuterol (XOPENEX HFA) 45 MCG/ACT inhaler, Inhale 2 puffs into the lungs every 4 (four) hours as needed for wheezing or shortness of breath., Disp: 15 g, Rfl: 3 .  levalbuterol (XOPENEX) 0.63 MG/3ML nebulizer solution, USE 1 VIAL VIA NEBULIZER EVERY 4 HOURS AS NEEDED FOR WHEEZING OR SHORTNESS OF BREATH, Disp: 300 mL, Rfl: 3 .  metoprolol succinate (TOPROL-XL) 25 MG 24 hr tablet, Take 1 tablet (25 mg total) by mouth daily., Disp: 90 tablet, Rfl: 1 .  Multiple Vitamins-Minerals (PRESERVISION AREDS 2) CAPS, Take 1 capsule by mouth daily., Disp: , Rfl:  .  nitroGLYCERIN (NITROSTAT) 0.4 MG SL tablet, Place 1 tablet (0.4 mg total) under the tongue every 5  (five) minutes as needed for chest pain., Disp: 25 tablet, Rfl: 3 .  OXYGEN, Inhale 2 L continuous into the lungs. , Disp: , Rfl:  .  predniSONE (DELTASONE) 10 MG tablet, FOR FLARE OF COUGH/WHEEZING MAY INCREASE TO 2 DAILY BETTER, THEN 1 DAILY, Disp: 100 tablet, Rfl: 2 .  sodium chloride (MURO 128) 5 % ophthalmic ointment, 1 drop. , Disp: , Rfl:  .  spironolactone (ALDACTONE) 25 MG tablet, TAKE 1/2 TABLET BY MOUTH EVERY DAY, Disp: 45 tablet, Rfl: 3 .  SYMBICORT 160-4.5 MCG/ACT inhaler, Inhale 2 puffs into the lungs 2 (two) times daily., Disp: , Rfl:  .  Tiotropium Bromide Monohydrate (SPIRIVA RESPIMAT) 2.5 MCG/ACT AERS, INHALE 2 PUFFS BY MOUTH EVERY DAY, Disp: 4 g, Rfl: 3 .  vitamin B-12 (CYANOCOBALAMIN) 100 MCG tablet, Take 100 mcg by mouth daily., Disp: , Rfl:   Social History   Tobacco Use  Smoking Status Former Smoker  . Packs/day: 2.00  . Years: 33.00  . Pack years: 66.00  . Types: Cigarettes  . Quit date: 03/25/1982  . Years since quitting: 37.0  Smokeless Tobacco Never Used    Allergies  Allergen Reactions  . Cefdinir Other (See Comments)    Severe Diarrhea  . Nitrofurantoin Swelling    Hand Swelling  . Sulfa Antibiotics Anaphylaxis and Swelling  . Sulfonamide Derivatives Swelling    REACTION: facial/tongue swelling  . Cefdinir Diarrhea    REACTION: diarrhea  . Ciprofloxacin Itching and Rash    Red itchy hands  . Nitrofurantoin Swelling    Swollen hands  . Amoxicillin Er     frequest urination  . Doxycycline Other (See Comments)    Felt terrible  . Sertraline Other (See Comments)    jittery  . Ciprofloxacin Itching and Rash  . Levaquin [Levofloxacin] Itching and Rash  . Levaquin [Levofloxacin] Itching and Rash   Objective:  There were no vitals filed for this visit. There is no height or weight on file to calculate BMI. Constitutional Well developed. Well nourished.  Vascular Dorsalis pedis pulses palpable bilaterally. Posterior tibial pulses palpable  bilaterally. Capillary refill normal to all digits.  No cyanosis or clubbing noted. Pedal hair growth normal.  Neurologic Normal speech. Oriented to person, place, and time. Epicritic sensation to light touch grossly present bilaterally.  Dermatologic  nonelevated with mild itching with the rubor component rash/inflammation noted across the bottom of the right foot as well as dorsally on the right foot.  No raised papules or lesions noted.  Orthopedic: Normal joint ROM without pain or crepitus bilaterally. No visible deformities. No bony tenderness.   Radiographs: 3 views of skeletally mature adult foot: No bony abnormalities identified.  Mild arthritic changes  noted at the dorsal midfoot.  No signs of osteomyelitis no signs of foreign body no soft tissue emphysema noted. Assessment:   1. Foot pain, right   2. Peripheral vascular disease, unspecified ([place])   3. Dermatitis    Plan:  Patient was evaluated and treated and all questions answered.  Right foot plantar/dorsal dermatitis -I explained to the patient the etiology of dermatitis and various treatment options were discussed with the patient.  I believe patient will benefit from continuous application of betamethasone cream.  I am not sure if there is a fungal involvement even though patient does have mild itching associated with it.  Patient also has not been diagnosed with underlying psoriasis that could be an etiology for this as well. -I believe the patient will benefit from a dermatological evaluation as this is primarily appear to be skin related issue.  Patient does not have any underlying musculoskeletal clinically. -Patient states understanding will follow up with his previous dermatologist.  No follow-ups on file.

## 2019-04-09 ENCOUNTER — Encounter: Payer: Self-pay | Admitting: *Deleted

## 2019-04-09 ENCOUNTER — Other Ambulatory Visit: Payer: Self-pay

## 2019-04-09 ENCOUNTER — Ambulatory Visit (INDEPENDENT_AMBULATORY_CARE_PROVIDER_SITE_OTHER): Payer: Medicare Other | Admitting: Dermatology

## 2019-04-09 ENCOUNTER — Encounter: Payer: Self-pay | Admitting: Dermatology

## 2019-04-09 DIAGNOSIS — I872 Venous insufficiency (chronic) (peripheral): Secondary | ICD-10-CM | POA: Diagnosis not present

## 2019-04-09 MED ORDER — SILVER SULFADIAZINE 1 % EX CREA: 1.0000 "application " | TOPICAL_CREAM | Freq: Every day | CUTANEOUS | 0 refills | Status: DC

## 2019-04-10 ENCOUNTER — Ambulatory Visit: Payer: Medicare Other | Admitting: Vascular Surgery

## 2019-04-10 NOTE — Patient Instructions (Signed)
[name] has experienced progressive, severe lower extremity edema with significant foot pain & burning.  Actual skin inflammation has improved, but now there is superficial erosion of the plantar surfaces of the distal toes on the right foot.  He is a high risk to develop cellulitis.  He will discontinue the topical corticoid and use Silvadene cream daily after bathing to open areas on the right foot.  I emphasized the potential benefits of keeping his legs elevated.  It is unlikely he could use or tolerate compressive hose right now.  Initial follow-up by phone in 2 weeks.

## 2019-04-10 NOTE — Progress Notes (Signed)
   Follow-Up Visit   Subjective  [name] is a 86 y.o. male who presents for the following: Rash (red rash or 3-4 months on both feet- given betamethasone-seems to help some but still causing lots of discomfort and pain).  Stasis dermatitis Location: Both legs Duration: 1+ year Quality: Increased edema Associated Signs/Symptoms: Burning pain in feet Modifying Factors: Topical clobetasol Severity:  Timing: Context: Congestive heart failure.  Does not tolerate daily Lasix.  The following portions of the chart were reviewed this encounter and updated as appropriate:     Objective  Well appearing patient in no apparent distress; mood and affect are within normal limits.  A focused examination was performed including arms, legs, pedal pulses, nails. Relevant physical exam findings are noted in the Assessment and Plan.  [name] has experienced progressive, severe lower extremity edema with significant foot pain & burning.  Actual skin inflammation has improved, but now there is superficial erosion of the plantar surfaces of the distal toes on the right foot.  He is a high risk to develop cellulitis.  He will discontinue the topical corticoid and use Silvadene cream daily after bathing to open areas on the right foot.  I emphasized the potential benefits of keeping his legs elevated.  It is unlikely he could use or tolerate compressive hose right now.  Initial follow-up by phone in 2 weeks. Assessment & Plan  Venous stasis dermatitis of left lower extremity (2) Left Foot - Anterior; Left Lower Leg - Anterior  Topical silvadene on open areas to reduce risk secondary infection and perhaps reduce burning pain feet. Hold on topical corticoid for now.  Venous stasis dermatitis of right lower extremity (2) Right Foot - Anterior; Right Lower Leg - Anterior

## 2019-04-11 NOTE — Progress Notes (Signed)
COMMUNITY PALLIATIVE CARE RN NOTE  PATIENT NAME: [name] DOB: [birth year] MRN: [id number]  PRIMARY CARE PROVIDER: [name], MD  RESPONSIBLE PARTY: Lavell Vanepps (wife) Acct ID - Guarantor Home Phone Work Phone Relationship Acct Type  [id number] - [name] [phone]  Self P/F     [address], [name], [place] [address]   Due to the COVID-19 crisis, this virtual check-in visit was done via telephone from my office and it was initiated and consent by this patient and or family.  PLAN OF CARE and INTERVENTION:  1. ADVANCE CARE PLANNING/GOALS OF CARE: Goal is for patient to remain in his home and figure out the cause of his right foot/leg pain 2. PATIENT/CAREGIVER EDUCATION: Symptom management, edema management, s/s of overall decline, s/s of infection 3. DISEASE STATUS: Virtual check-in visit completed via telephone. Patient continues to report not feeling well and worsening overall condition. He is experiencing some increased right leg/foot pain today and has an appointment with [facility] to get this evaluated. Wife says that he continues with persistent lower extremity edema. He does wear compression stockings to help. He is currently taking Lasix daily, but hesitates to take additional doses d/t it lowering his blood pressure. He had a recent appointment with his Cardiologist who changed the 2nd dose of Lasix to only Mon-Wed-Fri. He c/o feeling weak and tired all of the time. He is starting to have more difficulty providing personal care. [name] speaks of her own health issues and caregiver strain. She requested a list of in home caregiver services in the area. I sent her a list via email as requested. He continues on oxygen at 2L/min via [place]. He does experience some dyspnea with exertion. He uses a Copy during the night. He does take Xanax to help with his anxiety and ease breathing. His intake is fair. No issues with swallowing. Will continue to  monitor.   HISTORY OF PRESENT ILLNESS:  This is a 86 yo male who resides at home with is wife. Palliative care team continues to follow patient and visits monthly and PRN.  CODE STATUS: Full code ADVANCED DIRECTIVES: Y MOST FORM: no PPS: 50%   (Duration of visit and documentation 30 minutes)   [name], RN BSN

## 2019-04-14 ENCOUNTER — Other Ambulatory Visit: Payer: Self-pay

## 2019-04-14 ENCOUNTER — Other Ambulatory Visit: Payer: Self-pay | Admitting: *Deleted

## 2019-04-14 ENCOUNTER — Telehealth: Payer: Self-pay | Admitting: Physician Assistant

## 2019-04-14 ENCOUNTER — Telehealth: Payer: Self-pay | Admitting: *Deleted

## 2019-04-14 ENCOUNTER — Encounter: Payer: Self-pay | Admitting: Podiatry

## 2019-04-14 ENCOUNTER — Ambulatory Visit (INDEPENDENT_AMBULATORY_CARE_PROVIDER_SITE_OTHER): Payer: Medicare Other | Admitting: Podiatry

## 2019-04-14 DIAGNOSIS — I739 Peripheral vascular disease, unspecified: Secondary | ICD-10-CM

## 2019-04-14 DIAGNOSIS — R600 Localized edema: Secondary | ICD-10-CM

## 2019-04-14 DIAGNOSIS — L309 Dermatitis, unspecified: Secondary | ICD-10-CM | POA: Diagnosis not present

## 2019-04-14 DIAGNOSIS — I70229 Atherosclerosis of native arteries of extremities with rest pain, unspecified extremity: Secondary | ICD-10-CM

## 2019-04-14 MED ORDER — LEVALBUTEROL TARTRATE 45 MCG/ACT IN AERO: 2.0000 | INHALATION_SPRAY | RESPIRATORY_TRACT | 5 refills | Status: DC | PRN

## 2019-04-14 MED ORDER — AZITHROMYCIN 250 MG PO TABS: 250.0000 mg | ORAL_TABLET | Freq: Every day | ORAL | 0 refills | Status: DC

## 2019-04-14 NOTE — Telephone Encounter (Signed)
Pt c/o swelling: STAT is pt has developed SOB within 24 hours  1) How much weight have you gained and in what time span? About the same  2) If swelling, where is the swelling located? Patient feet toes skin has came off and bleeding  3) Are you currently taking a fluid pill? yes  4) Are you currently SOB? Yes patient has COPD  5) Do you have a log of your daily weights (if so, list)? yes  6) Have you gained 3 pounds in a day or 5 pounds in a week? No   7) Have you traveled recently? no

## 2019-04-14 NOTE — Telephone Encounter (Signed)
[name], [name] states pt had an appt today but has a extremely bad foot problem, foot is swelling and deteriorating and the skin sloughing off and is on a sulfadiazine.

## 2019-04-14 NOTE — Telephone Encounter (Signed)
I informed pt's wife, [name] I felt pt would benefit from seeing Dr. Elisha Ponder today and if necessary she could refer to another doctor in the practice. [name] states understanding.

## 2019-04-14 NOTE — Telephone Encounter (Signed)
Pt has lower extremity edema to legs and feet. Patient was given silvadene cream for toes by dermatologist for sloughing of skin. Legs are more painful at night. When he lowers legs at night it decreases the pain in the legs. Current weights are as follows 132.4, 131.8, 132.2, 131.8. Please advise.

## 2019-04-14 NOTE — Patient Instructions (Signed)

## 2019-04-14 NOTE — Progress Notes (Signed)
Subjective: [name] presents today for follow up of for at risk foot care. Patient has h/o PAD. He is accompanied by his wife on today's visit. Concern is for right foot, specifically digits 1-3 which were extremely tender with peeling skin. He has seen both Dr. Posey Pronto in our office as well as Dr. [name] in Dermatology; both appointments last week. Dr. [name] discontinued topical corticosteroid and [name] was started on Silvadene Cream to superficial erosive wounds of digits of the right foot.  Wife states she did spot testing to make sure he did not have an allergic reaction to the medication and he did not have a reaction to the Silvadene Cream.  [name] has secondary concern of pain in both lower extremities, right LE more symptomatic than left LE. Due to his chronic edema, he has been instructed to elevate his lower extremities. He notes pain along anterior portion of right shin down to his foot when he is lying in bed on his back. Wife states he only gets relief sitting up on the side of the bed and dangling his legs.  He is wearing open toed compression hose on left lower extremity today.  [name] did ask about a sequential compression device used in the hospital to control his edema.  [name] did have a Vascular appointment he was unable to make, but wife states she will make appointment if needed.  Allergies  Allergen Reactions  . Cefdinir Other (See Comments)    Severe Diarrhea  . Nitrofurantoin Swelling    Hand Swelling  . Sulfa Antibiotics Anaphylaxis and Swelling  . Sulfonamide Derivatives Swelling    REACTION: facial/tongue swelling  . Cefdinir Diarrhea    REACTION: diarrhea  . Ciprofloxacin Itching and Rash    Red itchy hands  . Nitrofurantoin Swelling    Swollen hands  . Amoxicillin Er     frequest urination  . Doxycycline Other (See Comments)    Felt terrible  . Sertraline Other (See Comments)    jittery  . Ciprofloxacin Itching and Rash  . Levaquin  [Levofloxacin] Itching and Rash  . Levaquin [Levofloxacin] Itching and Rash     Objective: There were no vitals filed for this visit.  Pt is an 86 y.o. year old pleasant Caucasian male in NAD. AAO x 3. On oxygen via nasal cannula.  Vascular Examination:  Capillary refill time to digits immediate b/l. Nonpalpable DP pulses b/l. Nonpalpable PT pulses b/l. Pedal hair present b/l. Skin temperature gradient within normal limits b/l. Dependent rubor noted b/l. +2 pedal edema noted BLE. No gangrene noted to digits. Possible early ischemic changes of digits of right foot.  Dermatological Examination:         Pedal skin with normal turgor, texture and tone bilaterally. No open wounds bilaterally. No interdigital macerations bilaterally. Toenails 1-5 b/l yellow, thickened but have adequate length today.   He has healing erosive lesions noted right hallux, right 2nd and right 3rd digits. No weeping, no drainage, no odor.  Right hallux is erythematous and tender to palpation.   Musculoskeletal: No gross bony deformities bilaterally and no pain crepitus or joint limitation noted with ROM b/l.  Compression hose for left lower extremity do not appear to be controlling pedal edema at all.  Neurological: Protective sensation intact 5/5 intact bilaterally with 10g monofilament b/l. AAO x 3.  LOWER EXTREMITY DOPPLER STUDY: 11/13/2018  Indications: Peripheral artery disease.    Performing Technologist: Ralene Cork RVT     Examination Guidelines:  A complete evaluation includes at minimum, Doppler  waveform signals and systolic blood pressure reading at the level of  bilateral  brachial, anterior tibial, and posterior tibial arteries, when vessel  segments  are accessible. Bilateral testing is considered an integral part of a  complete  examination. Photoelectric Plethysmograph (PPG) waveforms and toe systolic  pressure readings are included as required and additional duplex testing   as  needed. Limited examinations for reoccurring indications may be performed  as  noted.     ABI Findings:  +---------+------------------+-----+-------------------+--------+  Right  Rt Pressure (mmHg)IndexWaveform      Comment   +---------+------------------+-----+-------------------+--------+  Brachial 161                           +---------+------------------+-----+-------------------+--------+  PTA   255        1.55 dampened monophasic      +---------+------------------+-----+-------------------+--------+  DP    255        1.55 dampened monophasic      +---------+------------------+-----+-------------------+--------+  Great Toe0         0.00 Absent             +---------+------------------+-----+-------------------+--------+   +---------+------------------+-----+-------------------+-------------------  ----+  Left   Lt Pressure (mmHg)IndexWaveform      Comment           +---------+------------------+-----+-------------------+-------------------  ----+  Brachial 164                                   +---------+------------------+-----+-------------------+-------------------  ----+  PTA   255        1.55 dampened monophasic              +---------+------------------+-----+-------------------+-------------------  ----+  DP                dampened monophasicbarely audible.  Return                              of pulse not  heard.    +---------+------------------+-----+-------------------+-------------------  ----+  Great Toe0         0.00 Absent                     +---------+------------------+-----+-------------------+-------------------  ----+    +-------+-----------+-----------+------------+------------+  ABI/TBIToday's ABIToday's TBIPrevious ABIPrevious TBI  +-------+-----------+-----------+------------+------------+  Right [place]     0     [place]     0.25      +-------+-----------+-----------+------------+------------+  Left  [name]     0     0.81    0.29      +-------+-----------+-----------+------------+------------+    Previous ABI on 07/04/17.    Summary:  Right: Resting right ankle-brachial index indicates noncompressible right  lower extremity arteries. The right toe-brachial index is abnormal. RT  great toe pressure = 0 mmHg.   Left: Resting left ankle-brachial index indicates noncompressible left  lower extremity arteries. The left toe-brachial index is abnormal. LT  Great toe pressure = 0 mmHg.      *See table(s) above for measurements and observations.      Electronically signed by Deitra Mayo MD on 11/13/2018 at 2:11:30  PM.       Final   Assessment: 1. Rest pain of lower extremity due to atherosclerosis ([place])   2. Peripheral vascular disease, unspecified ([place])   3. Localized edema   4. Dermatitis  Plan: -Discussed patient's diagnoses with him and his wife. Lesions on right foot appear to be healing.  -We did discuss adding Azithromycin 250 mg qd for one week and that was sent to his pharmacy. -The symptom he describes at today's visit  is classic rest pain and I have advised him and his wife to reschedule the appointment to see vascular for the rest pain symptoms. -Advised wife to place him in house slippers which would better accommodate his pedal edema. -Patient to report any pedal injuries to medical professional immediately. -His compression hose are not adequate and appear to be too small for the amount of edema he has. We also discussed considering custom compression hose at a later time. -Regarding sequential compression device, I  advised them to discuss with Vascular.  -Wife will call back after Vascular appointment.  Return for nail trim.

## 2019-04-15 ENCOUNTER — Other Ambulatory Visit: Payer: Self-pay | Admitting: *Deleted

## 2019-04-15 DIAGNOSIS — I739 Peripheral vascular disease, unspecified: Secondary | ICD-10-CM

## 2019-04-15 DIAGNOSIS — I779 Disorder of arteries and arterioles, unspecified: Secondary | ICD-10-CM

## 2019-04-16 ENCOUNTER — Ambulatory Visit (HOSPITAL_COMMUNITY)
Admission: RE | Admit: 2019-04-16 | Discharge: 2019-04-16 | Disposition: A | Discharge status: Home / Self Care | Payer: Medicare Other | Source: Ambulatory Visit | Attending: Vascular Surgery | Admitting: Vascular Surgery

## 2019-04-16 ENCOUNTER — Other Ambulatory Visit: Payer: Self-pay

## 2019-04-16 ENCOUNTER — Encounter (HOSPITAL_BASED_OUTPATIENT_CLINIC_OR_DEPARTMENT_OTHER): Payer: Medicare Other | Admitting: Physician Assistant

## 2019-04-16 ENCOUNTER — Encounter: Payer: Self-pay | Admitting: Vascular Surgery

## 2019-04-16 ENCOUNTER — Ambulatory Visit (INDEPENDENT_AMBULATORY_CARE_PROVIDER_SITE_OTHER): Payer: Medicare Other | Admitting: Vascular Surgery

## 2019-04-16 VITALS — BP 116/70 | HR 62 | Temp 97.7°F | Resp 20 | Ht 69.0 in | Wt 139.0 lb

## 2019-04-16 DIAGNOSIS — I779 Disorder of arteries and arterioles, unspecified: Secondary | ICD-10-CM | POA: Diagnosis not present

## 2019-04-16 DIAGNOSIS — L97909 Non-pressure chronic ulcer of unspecified part of unspecified lower leg with unspecified severity: Secondary | ICD-10-CM | POA: Diagnosis not present

## 2019-04-16 DIAGNOSIS — I739 Peripheral vascular disease, unspecified: Secondary | ICD-10-CM

## 2019-04-16 DIAGNOSIS — I70299 Other atherosclerosis of native arteries of extremities, unspecified extremity: Secondary | ICD-10-CM | POA: Diagnosis not present

## 2019-04-16 NOTE — Progress Notes (Signed)
Patient name: LAVARIS MELLY MRN: [id number] DOB: June 27, 1929 Sex: male  REASON FOR VISIT:   Peripheral vascular disease with ulceration  HPI:   [name] is a pleasant 86 y.o. male who I last saw on 11/13/2018 with peripheral vascular disease.  He also had a moderate 40 to 59% right carotid stenosis which we were following.  Of note he is quite debilitated on home O2 secondary to COPD and is steroid dependent.  At the time of his last visit he had evidence of multilevel arterial occlusive disease on exam but had minimal symptoms and we were following this conservatively.  He had a less than 39% carotid stenosis bilaterally at the time of his last visit.  On my history the patient developed some rash on his foot which has been treated with topical agents.  He has developed some superficial ulceration on his right great toe.  Patient's activity is very limited because of his severe COPD.  He is steroid dependent on continual O2.  He does describe rest pain in both feet.  He has significant swelling in his feet and spends most of his time with his feet hanging down as they hurt when they are not dependent.  He denies fever or chills.  Of note, the history is very difficult to obtain as the patient is hard of hearing.  Past Medical History:  Diagnosis Date  . Arthritis    "generalized" (04/04/2017)  . Asthma   . Basal cell carcinoma    "left ear"  . BPH (benign prostatic hyperplasia)    severe; s/p multiple biopsies  . CAD (coronary artery disease)   . Carotid artery occlusion   . Chronic respiratory failure ([place])   . Chronic rhinitis   . COPD (chronic obstructive pulmonary disease) (HCC)    2L [place] O2  . Diastolic heart failure ([place]) 2019  . Dilation of biliary tract   . Elevated troponin 03/09/2017  . Gallstones   . GERD (gastroesophageal reflux disease)   . History of blood transfusion    "w/his CABG" (04/04/2017)  . History of kidney stones   . Hyperlipidemia   . Hypertension   .  On home oxygen therapy    "~ 24/7" (04/04/2017)  . Peripheral vascular disease ([place])   . Pneumonia 2019  . Squamous cell carcinoma of skin 04/23/2013   in situ-Right flank (txpbx)  . Squamous cell carcinoma of skin 01/23/2017   [place]/ in siut- Left ear rim (CX3FU)  . Syncope and collapse   . Ureteral tumor 08/2015   had endoscopic procedure for evaluation, unable to reach for biopsy    Family History  Problem Relation Age of Onset  . Heart disease Mother   . Heart disease Father        Before age 5  . Breast cancer Mother   . Cancer Mother        Breast cancer  . Hypertension Mother   . Other Mother        AAA  and   Amputation  . Heart attack Father   . Stroke Brother        x3, still living   . Peripheral vascular disease Brother   . Heart disease Brother   . Hyperlipidemia Brother   . Hypertension Brother   . Heart disease Brother   . Allergies Brother     SOCIAL HISTORY: Social History   Tobacco Use  . Smoking status: Former Smoker    Packs/day: 2.00  Years: 33.00    Pack years: 66.00    Types: Cigarettes    Quit date: 03/25/1982    Years since quitting: 37.0  . Smokeless tobacco: Never Used  Substance Use Topics  . Alcohol use: Yes    Alcohol/week: 0.0 standard drinks    Comment: 1.5-3 oz daily in the past, minimal use in the last few months    Allergies  Allergen Reactions  . Cefdinir Other (See Comments)    Severe Diarrhea  . Nitrofurantoin Swelling    Hand Swelling  . Sulfa Antibiotics Anaphylaxis and Swelling  . Sulfonamide Derivatives Swelling    REACTION: facial/tongue swelling  . Cefdinir Diarrhea    REACTION: diarrhea  . Ciprofloxacin Itching and Rash    Red itchy hands  . Nitrofurantoin Swelling    Swollen hands  . Amoxicillin Er     frequest urination  . Doxycycline Other (See Comments)    Felt terrible  . Sertraline Other (See Comments)    jittery  . Ciprofloxacin Itching and Rash  . Levaquin [Levofloxacin] Itching and Rash    . Levaquin [Levofloxacin] Itching and Rash    Current Outpatient Medications  Medication Sig Dispense Refill  . ALPRAZolam (XANAX) 0.25 MG tablet TAKE 1 TABLET BY MOUTH THREE TIMES DAILY AS NEEDED 90 tablet 3  . aspirin 81 MG tablet Take 81 mg by mouth daily.    Marland Kitchen atorvastatin (LIPITOR) 20 MG tablet TAKE 1 TABLET BY MOUTH EVERY DAY 90 tablet 2  . azithromycin (ZITHROMAX) 250 MG tablet Take 1 tablet (250 mg total) by mouth daily. 7 each 0  . betamethasone dipropionate 0.05 % cream Apply topically 2 (two) times daily. Apply to both feet twice daily for 4 weeks. 45 g 1  . Cholecalciferol (VITAMIN D3) 2000 units TABS Take 2,000 Units by mouth daily.    . famotidine (PEPCID) 20 MG tablet Take by mouth.    . finasteride (PROSCAR) 5 MG tablet Take 5 mg by mouth every morning.    . fluticasone (FLONASE) 50 MCG/ACT nasal spray Place 2 sprays into both nostrils daily.    . furosemide (LASIX) 20 MG tablet Take 20 mg by mouth daily. Take 20 mg daily and an additional 20 mg on MWF afternoons     . levalbuterol (XOPENEX HFA) 45 MCG/ACT inhaler Inhale 2 puffs into the lungs every 4 (four) hours as needed for wheezing or shortness of breath. 15 g 5  . levalbuterol (XOPENEX) 0.63 MG/3ML nebulizer solution USE 1 VIAL VIA NEBULIZER EVERY 4 HOURS AS NEEDED FOR WHEEZING OR SHORTNESS OF BREATH 300 mL 3  . metoprolol succinate (TOPROL-XL) 25 MG 24 hr tablet Take 1 tablet (25 mg total) by mouth daily. 90 tablet 1  . Multiple Vitamins-Minerals (PRESERVISION AREDS 2) CAPS Take 1 capsule by mouth daily.    . nitroGLYCERIN (NITROSTAT) 0.4 MG SL tablet Place 1 tablet (0.4 mg total) under the tongue every 5 (five) minutes as needed for chest pain. 25 tablet 3  . OXYGEN Inhale 2 L continuous into the lungs.     . predniSONE (DELTASONE) 10 MG tablet FOR FLARE OF COUGH/WHEEZING MAY INCREASE TO 2 DAILY BETTER, THEN 1 DAILY 100 tablet 2  . silver sulfADIAZINE (SILVADENE) 1 % cream Apply 1 application topically daily. 50 g 0   . sodium chloride (MURO 128) 5 % ophthalmic ointment 1 drop.     Marland Kitchen spironolactone (ALDACTONE) 25 MG tablet TAKE 1/2 TABLET BY MOUTH EVERY DAY 45 tablet 3  . SYMBICORT 160-4.5  MCG/ACT inhaler Inhale 2 puffs into the lungs 2 (two) times daily.    . Tiotropium Bromide Monohydrate (SPIRIVA RESPIMAT) 2.5 MCG/ACT AERS INHALE 2 PUFFS BY MOUTH EVERY DAY 4 g 3  . vitamin B-12 (CYANOCOBALAMIN) 100 MCG tablet Take 100 mcg by mouth daily.     No current facility-administered medications for this visit.    REVIEW OF SYSTEMS:  [X]  denotes positive finding, [ ]  denotes negative finding Cardiac  Comments:  Chest pain or chest pressure:    Shortness of breath upon exertion: x   Short of breath when lying flat: x   Irregular heart rhythm:        Vascular    Pain in calf, thigh, or hip brought on by ambulation:    Pain in feet at night that wakes you up from your sleep:  x   Blood clot in your veins:    Leg swelling:  x       Pulmonary    Oxygen at home: x   Productive cough:  x   Wheezing:         Neurologic    Sudden weakness in arms or legs:     Sudden numbness in arms or legs:     Sudden onset of difficulty speaking or slurred speech:    Temporary loss of vision in one eye:     Problems with dizziness:         Gastrointestinal    Blood in stool:     Vomited blood:         Genitourinary    Burning when urinating:     Blood in urine:        Psychiatric    Major depression:         Hematologic    Bleeding problems:    Problems with blood clotting too easily:        Skin    Rashes or ulcers: x       Constitutional    Fever or chills:     PHYSICAL EXAM:   Vitals:   04/16/19 1248  BP: 116/70  Pulse: 62  Resp: 20  Temp: 97.7 F (36.5 C)  SpO2: 98%  Weight: 139 lb (63 kg)  Height: 5\' 9"  (1.753 m)    GENERAL: The patient is a well-nourished male, in no acute distress. The vital signs are documented above. CARDIAC: There is a regular rate and rhythm.  VASCULAR: I do  not detect carotid bruits. I cannot palpate femoral pulses. I cannot palpate pedal pulses.  He has monophasic signals in both feet. He has significant swelling in both feet. PULMONARY: There is good air exchange bilaterally without wheezing or rales. ABDOMEN: Soft and non-tender with normal pitched bowel sounds.  MUSCULOSKELETAL: There are no major deformities or cyanosis. NEUROLOGIC: No focal weakness or paresthesias are detected. SKIN: He has superficial ulceration of the right great toe      PSYCHIATRIC: The patient has a normal affect.  DATA:    ARTERIAL DUPLEX: I have independently interpreted his arterial duplex of the right lower extremity today.  He is noted to have a greater than 75% stenosis in the proximal and mid common femoral artery.  He has monophasic flow in the posterior tibial and dorsalis pedis positions on the right foot.  Arteries are noncompressible and ABIs could not be obtained.  There was total occlusion of the superficial femoral artery on the right.  MEDICAL ISSUES:   CRITICAL LIMB ISCHEMIA: This patient has  a nonhealing wound on his right great toe with severe multilevel arterial occlusive disease.  He has absent femoral pulses and superficial femoral artery occlusions.  I am concerned that without revascularization the wound will likely progress and he will require above-the-knee amputation.  He is not a candidate for arteriography given his bilateral femoral artery occlusions and regardless, he would not be a candidate for endovascular approach given his common femoral artery occlusions and proximal iliac disease.  His only option for revascularization would be a combined inflow procedure and infrainguinal bypass which she simply would not tolerate given his cardiac history and severe COPD.  Therefore our only really option if this progresses would be primary above-the-knee amputation.  This would be associated with significant risk given his cardiac and pulmonary  history.  In addition he would have some risk of nonhealing given his severe inflow disease.  I plan on seeing him back in 3 months.  He will call sooner if things change.  Deitra Mayo [facility] [phone]

## 2019-04-17 ENCOUNTER — Telehealth: Payer: Self-pay

## 2019-04-17 ENCOUNTER — Telehealth: Payer: Self-pay | Admitting: Physician Assistant

## 2019-04-17 ENCOUNTER — Other Ambulatory Visit: Payer: Medicare Other | Admitting: *Deleted

## 2019-04-17 ENCOUNTER — Other Ambulatory Visit: Payer: Self-pay

## 2019-04-17 DIAGNOSIS — I70299 Other atherosclerosis of native arteries of extremities, unspecified extremity: Secondary | ICD-10-CM

## 2019-04-17 DIAGNOSIS — L97909 Non-pressure chronic ulcer of unspecified part of unspecified lower leg with unspecified severity: Secondary | ICD-10-CM

## 2019-04-17 DIAGNOSIS — I779 Disorder of arteries and arterioles, unspecified: Secondary | ICD-10-CM

## 2019-04-17 DIAGNOSIS — Z515 Encounter for palliative care: Secondary | ICD-10-CM

## 2019-04-17 NOTE — Telephone Encounter (Signed)
He may take additional 20mg  lasix as needed for leg swelling. Need early office visit with Dr. Tamala Julian or his APP. Unfortunately very difficult case given underlying cardiac, pulmonary and also severe vascular issue.

## 2019-04-17 NOTE — Telephone Encounter (Signed)
New message   Patient's wife is returning call because she has not received a call back in reference to the patient having swelling per the previous message. Please call.

## 2019-04-17 NOTE — Progress Notes (Signed)
[place] Referral

## 2019-04-17 NOTE — Telephone Encounter (Signed)
Received phone call from Palliative RN regarding patient's need for Home health nurse for wound care. Message sent to MD to request referral

## 2019-04-17 NOTE — Telephone Encounter (Signed)
Patient saw the vascular team yesterday, has severe arterial disease in the leg with critical limb ischemia, this is causing him to have decreased blood flow to the legs when he elevate them at night, unfortunately he is a high risk surgical patient

## 2019-04-18 ENCOUNTER — Telehealth: Payer: Self-pay | Admitting: Dermatology

## 2019-04-18 NOTE — Telephone Encounter (Signed)
Patient's wife-Jacqueline is calling to report that patient is doing poorly. [name] is asking that Dr. [name] look at patient's visit with [name] yesterday. Nothing can be done within vascular field, not a candidate for surgery.  Lateral surface of toe, lesions are open. Medication is not working.  [name] states they are not unhappy with Dr. Onalee Hua care, but given this is a complex matter, wondering if patient should be taken to wound care. Asking for advice on how to proceed with patient care. [name] states to leave a detailed message if unable to answer phone. Chart [phone]

## 2019-04-18 NOTE — Telephone Encounter (Signed)
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 They were told I had already reviewed in detail the vascular surgeon's report and I understand and share their disappointment. Dr. [name] is still concerned that his oral sulfa allergy could represent a problem with using the Silvadene, so I told him to just stop it and he may apply Aquaphor instead. They understand that this is not a primary skin problem and that there is no topical "fix" for his complex arterial + venous disease. My two suggestions: (1) have PCP or vascular refer him to wound care clinic; perhaps he is a candidate for hyperbaric O2. (2) if the local wound clinic cannot help, they may seek a second opinion from Dr. Martyn Malay, head of the wound clinic at [facility]. Although I believe Dr. Zigmund Daniel is most knowledgeable about all measures available for non-healing lower extremity issues, Dr. Cyndie Chime complex vascular disorder may not be amenable to medical therapy.

## 2019-04-21 ENCOUNTER — Other Ambulatory Visit: Payer: Self-pay | Admitting: Internal Medicine

## 2019-04-21 ENCOUNTER — Encounter (HOSPITAL_BASED_OUTPATIENT_CLINIC_OR_DEPARTMENT_OTHER): Payer: Medicare Other | Attending: Internal Medicine | Admitting: Internal Medicine

## 2019-04-21 ENCOUNTER — Other Ambulatory Visit: Payer: Self-pay

## 2019-04-21 DIAGNOSIS — I509 Heart failure, unspecified: Secondary | ICD-10-CM | POA: Diagnosis not present

## 2019-04-21 DIAGNOSIS — H35373 Puckering of macula, bilateral: Secondary | ICD-10-CM | POA: Diagnosis not present

## 2019-04-21 DIAGNOSIS — H1045 Other chronic allergic conjunctivitis: Secondary | ICD-10-CM | POA: Diagnosis not present

## 2019-04-21 DIAGNOSIS — I110 Hypertensive heart disease with heart failure: Secondary | ICD-10-CM | POA: Diagnosis not present

## 2019-04-21 DIAGNOSIS — J449 Chronic obstructive pulmonary disease, unspecified: Secondary | ICD-10-CM | POA: Insufficient documentation

## 2019-04-21 DIAGNOSIS — H353131 Nonexudative age-related macular degeneration, bilateral, early dry stage: Secondary | ICD-10-CM | POA: Diagnosis not present

## 2019-04-21 DIAGNOSIS — I87323 Chronic venous hypertension (idiopathic) with inflammation of bilateral lower extremity: Secondary | ICD-10-CM | POA: Insufficient documentation

## 2019-04-21 DIAGNOSIS — I70235 Atherosclerosis of native arteries of right leg with ulceration of other part of foot: Secondary | ICD-10-CM | POA: Diagnosis not present

## 2019-04-21 DIAGNOSIS — Z961 Presence of intraocular lens: Secondary | ICD-10-CM | POA: Diagnosis not present

## 2019-04-21 DIAGNOSIS — Z87891 Personal history of nicotine dependence: Secondary | ICD-10-CM | POA: Insufficient documentation

## 2019-04-21 DIAGNOSIS — H17823 Peripheral opacity of cornea, bilateral: Secondary | ICD-10-CM | POA: Diagnosis not present

## 2019-04-21 DIAGNOSIS — Z9981 Dependence on supplemental oxygen: Secondary | ICD-10-CM | POA: Insufficient documentation

## 2019-04-21 DIAGNOSIS — H18513 Endothelial corneal dystrophy, bilateral: Secondary | ICD-10-CM | POA: Diagnosis not present

## 2019-04-21 DIAGNOSIS — H40053 Ocular hypertension, bilateral: Secondary | ICD-10-CM | POA: Diagnosis not present

## 2019-04-21 DIAGNOSIS — L97511 Non-pressure chronic ulcer of other part of right foot limited to breakdown of skin: Secondary | ICD-10-CM | POA: Diagnosis not present

## 2019-04-21 MED ORDER — ALPRAZOLAM 0.25 MG PO TABS: ORAL_TABLET | ORAL | 3 refills | Status: DC

## 2019-04-21 NOTE — Progress Notes (Signed)
[name], [name] ([id number]) Visit Report for 04/21/2019 Abuse/Suicide Risk Screen Details Patient Name: Date of Service: [name], [name] 04/21/2019 10:30 AM Medical Record [id number] Patient Account Number: [id number] Date of Birth/Sex: Treating RN: Sep 08, 1929 (86 y.o. [name] Primary Care Ilija Maxim: Lona [name] Other Clinician: Referring Brodan Grewell: Treating Luis Sami/Extender:Robson, Archie Patten, CHARLES Weeks in Treatment: 0 Abuse/Suicide Risk Screen Items Answer ABUSE RISK SCREEN: Has anyone close to you tried to hurt or harm you recentlyo No Do you feel uncomfortable with anyone in your familyo No Has anyone forced you do things that you didnt want to doo No Electronic Signature(s) Signed: 04/21/2019 5:40:42 PM By: Kela Millin Entered By: Kela Millin on 04/21/2019 11:11:20 -------------------------------------------------------------------------------- Activities of Daily Living Details Patient Name: Date of Service: Travis, [name] 04/21/2019 10:30 AM Medical Record [id number] Patient Account Number: [id number] Date of Birth/Sex: Treating RN: 23-Mar-1929 (86 y.o. [name] Primary Care Shevawn Langenberg: Lona [name] Other Clinician: Referring Lichelle Viets: Treating Sandra Brents/Extender:Robson, Archie Patten, CHARLES Weeks in Treatment: 0 Activities of Daily Living Items Answer Activities of Daily Living (Please select one for each item) Drive Automobile Not Able Take Medications Completely Able Use Telephone Completely Able Care for Appearance Completely Able Use Toilet Completely Able Bath / Shower Completely Able Dress Self Completely Able Feed Self Completely Able Walk Completely Able Get In / Out Bed Completely Able Housework Need Assistance Prepare Meals Need Assistance Handle Money Completely Able Shop for Self Need Assistance Electronic Signature(s) Signed: 04/21/2019 5:40:42 PM By: Kela Millin Entered By: Kela Millin on 04/21/2019 11:12:34 -------------------------------------------------------------------------------- Education Screening Details Patient Name: Date of Service: [name], [name] 04/21/2019 10:30 AM Medical Record [id number] Patient Account Number: [id number] Date of Birth/Sex: Treating RN: 1929/01/29 (86 y.o. [name] Primary Care [name]: Lona [name] Other Clinician: Referring [name]: Treating [name]/Extender:Robson, Archie Patten, Docia Barrier in Treatment: 0 Primary Learner Assessed: Patient Learning Preferences/Education Level/Primary Language Learning Preference: Explanation, Printed Material Highest Education Level: College or Above Preferred Language: English Cognitive Barrier Language Barrier: No Translator Needed: No Memory Deficit: No Emotional Barrier: No Cultural/Religious Beliefs Affecting Medical Care: No Physical Barrier Impaired Vision: Yes Glasses Impaired Hearing: Yes very hard of hearing Knowledge/Comprehension Knowledge Level: High Comprehension Level: High Ability to understand written High instructions: Ability to understand verbal High instructions: Motivation Anxiety Level: Calm Cooperation: Cooperative Education Importance: Acknowledges Need Interest in Health Problems: Asks Questions Perception: Coherent Willingness to Engage in Self- High Management Activities: Readiness to Engage in Self- High Management Activities: Electronic Signature(s) Signed: 04/21/2019 5:40:42 PM By: Kela Millin Entered By: Kela Millin on 04/21/2019 11:13:29 -------------------------------------------------------------------------------- Fall Risk Assessment Details Patient Name: Date of Service: [name], Travis L. 04/21/2019 10:30 AM Medical Record [id number] Patient Account Number: [id number] Date of Birth/Sex: Treating RN: Mar 08, 1929 (87 y.o. [name] Primary Care [name]: Lona [name] Other  Clinician: Referring [name]: Treating [name]/Extender:Robson, Archie Patten, Docia Barrier in Treatment: 0 Fall Risk Assessment Items Have you had 2 or more falls in the last 12 monthso 0 No Have you had any fall that resulted in injury in the last 12 monthso 0 No FALLS RISK SCREEN History of falling - immediate or within 3 months 0 No Secondary diagnosis (Do you have 2 or more medical diagnoseso) 0 No Ambulatory aid None/bed rest/wheelchair/nurse 0 Yes Crutches/cane/walker 0 No Furniture 0 No Intravenous therapy Access/Saline/Heparin Lock 0 No Weak (short steps with or without shuffle, stooped but able to lift head 0 No while walking, may seek support from furniture) Impaired (short steps with shuffle, may have  difficulty arising from chair, 0 No head down, impaired balance) Mental Status Oriented to own ability 0 Yes Overestimates or forgets limitations 0 No Risk Level: Low Risk Score: 0 Electronic Signature(s) Signed: 04/21/2019 5:40:42 PM By: Kela Millin Entered By: Kela Millin on 04/21/2019 11:14:52 -------------------------------------------------------------------------------- Foot Assessment Details Patient Name: Date of Service: [name], [name] 04/21/2019 10:30 AM Medical Record [id number] Patient Account Number: [id number] Date of Birth/Sex: Treating RN: October 07, 1929 (86 y.o. [name] Primary Care Dearis Danis: Lona [name] Other Clinician: Referring Maizee Reinhold: Treating Latif Nazareno/Extender:Robson, Archie Patten, CHARLES Weeks in Treatment: 0 Foot Assessment Items Site Locations + = Sensation present, - = Sensation absent, C = Callus, U = Ulcer R = Redness, W = Warmth, M = Maceration, PU = Pre-ulcerative lesion F = Fissure, S = Swelling, D = Dryness Assessment Right: Left: Other Deformity: No No Prior Foot Ulcer: No No Prior Amputation: No No Charcot Joint: No No Ambulatory Status: Ambulatory Without Help Gait: Steady Electronic  Signature(s) Signed: 04/21/2019 5:40:42 PM By: Kela Millin Entered By: Kela Millin on 04/21/2019 11:19:06 -------------------------------------------------------------------------------- Nutrition Risk Screening Details Patient Name: Date of Service: CHRISOTPHER, CURCIO 04/21/2019 10:30 AM Medical Record [id number] Patient Account Number: [id number] Date of Birth/Sex: Treating RN: [birth year] (86 y.o. [name] Primary Care Lacinda Curvin: Lona [name] Other Clinician: Referring Ritha Sampedro: Treating Eiliyah Reh/Extender:Robson, Archie Patten, CHARLES Weeks in Treatment: 0 Height (in): 69 Weight (lbs): 132 Body Mass Index (BMI): 19.5 Nutrition Risk Screening Items Score Screening NUTRITION RISK SCREEN: I have an illness or condition that made me change the kind and/or 0 No amount of food I eat I eat fewer than two meals per day 0 No I eat few fruits and vegetables, or milk products 0 No I have three or more drinks of beer, liquor or wine almost every day 0 No I have tooth or mouth problems that make it hard for me to eat 2 Yes I don't always have enough money to buy the food I need 0 No I eat alone most of the time 0 No I take three or more different prescribed or over-the-counter drugs a day 1 Yes 0 No Without wanting to, I have lost or gained 10 pounds in the last six months I am not always physically able to shop, cook and/or feed myself 0 No Nutrition Protocols Good Risk Protocol Provide education on Moderate Risk Protocol 0 nutrition High Risk Proctocol Risk Level: Moderate Risk Score: 3 Electronic Signature(s) Signed: 04/21/2019 5:40:42 PM By: Kela Millin Entered By: Kela Millin on 04/21/2019 11:15:43

## 2019-04-21 NOTE — Telephone Encounter (Signed)
Called spoke with patient's wife she states patient needs refill on xanax, last refilled by Dr. [name] 11/2018 for 3 refills.   Dr. [name] please advise on xanax refill  Also Dr. [name] wife states Ermin has had a change in his health and there are new vascular issues found on imaging if you would like to take a look at them. Patient is seeing Dr. Scot Dock.

## 2019-04-22 ENCOUNTER — Other Ambulatory Visit: Payer: Self-pay | Admitting: Internal Medicine

## 2019-04-22 MED ORDER — MOMETASONE FUROATE 50 MCG/ACT NA SUSP: 2.0000 | Freq: Every day | NASAL | 5 refills | Status: DC

## 2019-04-22 NOTE — Progress Notes (Signed)
Cardiology Office Note:    Date:  04/23/2019   ID:  [name], DOB 01-08-1930, MRN [id number]  PCP:  [name], MD  Cardiologist:  Sinclair Grooms, MD   Referring MD: [name], MD   Chief Complaint  Patient presents with  . Coronary Artery Disease  . Chest Pain  . Claudication    History of Present Illness:    CARION SWEGER is a 86 y.o. male with a hx of CADs/pCABG, COPDwith cor pulmonale,HTN, HLD, PAF, PAD and carotid artery disease.Echocardiogram obtained on 03/09/2017 showed EF 60 to 65%, grade 1 DD mild TR, peak PA pressure 24 mmHg. Echocardiogram obtained on 10/08/2018 showed EF>55%, bicuspid aortic valve.recent ischemic right right foot.  He is accompanied by his wife.  Major concern is that of pain in both lower extremities particularly when he lies down.  This occurs after an hour and a half or so.  Sitting up and dangling his legs helps the discomfort to improve.  He has an ischemic ulcer on his right great toe.  No interventional approaches are possible given COPD, hypoxia, and ischemic cardiomyopathy.  He is pretty desperate.  He is concerned about edema in his lower extremities.  Appetite is been good.  He is not having dyspnea when he lies down.  He denies angina.  Past Medical History:  Diagnosis Date  . Arthritis    "generalized" (04/04/2017)  . Asthma   . Basal cell carcinoma    "left ear"  . BPH (benign prostatic hyperplasia)    severe; s/p multiple biopsies  . CAD (coronary artery disease)   . Carotid artery occlusion   . Chronic respiratory failure ([place])   . Chronic rhinitis   . COPD (chronic obstructive pulmonary disease) (HCC)    2L [place] O2  . Diastolic heart failure ([place]) 2019  . Dilation of biliary tract   . Elevated troponin 03/09/2017  . Gallstones   . GERD (gastroesophageal reflux disease)   . History of blood transfusion    "w/his CABG" (04/04/2017)  . History of kidney stones   . Hyperlipidemia   . Hypertension   . On  home oxygen therapy    "~ 24/7" (04/04/2017)  . Peripheral vascular disease ([place])   . Pneumonia 2019  . Squamous cell carcinoma of skin 04/23/2013   in situ-Right flank (txpbx)  . Squamous cell carcinoma of skin 01/23/2017   [place]/ in siut- Left ear rim (CX3FU)  . Syncope and collapse   . Ureteral tumor 08/2015   had endoscopic procedure for evaluation, unable to reach for biopsy    Past Surgical History:  Procedure Laterality Date  . BASAL CELL CARCINOMA EXCISION Left    ear  . CARDIAC CATHETERIZATION     "prior to bypass"  . CATARACT EXTRACTION W/ INTRAOCULAR LENS  IMPLANT, BILATERAL Bilateral   . CORONARY ARTERY BYPASS GRAFT  10/2001   LIMA to LAD, SVG to OM1-2, SVG to RCA and PDA  . LITHOTRIPSY    . PROSTATE BIOPSY  Oct. 2014  . TONSILLECTOMY      Current Medications: Current Meds  Medication Sig  . ALPRAZolam (XANAX) 0.25 MG tablet TAKE 1 TABLET BY MOUTH THREE TIMES DAILY AS NEEDED  . aspirin 81 MG tablet Take 81 mg by mouth daily.  Marland Kitchen atorvastatin (LIPITOR) 20 MG tablet TAKE 1 TABLET BY MOUTH EVERY DAY  . betamethasone dipropionate 0.05 % cream Apply topically 2 (two) times daily. Apply to both feet twice daily  for 4 weeks.  . Cholecalciferol (VITAMIN D3) 2000 units TABS Take 2,000 Units by mouth daily.  . famotidine (PEPCID) 20 MG tablet Take by mouth.  . finasteride (PROSCAR) 5 MG tablet Take 5 mg by mouth every morning.  . fluticasone (FLONASE) 50 MCG/ACT nasal spray Place 2 sprays into both nostrils daily.  . furosemide (LASIX) 20 MG tablet Take 20 mg by mouth daily. Take 20 mg daily and an additional 20 mg on MWF afternoons   . levalbuterol (XOPENEX HFA) 45 MCG/ACT inhaler Inhale 2 puffs into the lungs every 4 (four) hours as needed for wheezing or shortness of breath.  . levalbuterol (XOPENEX) 0.63 MG/3ML nebulizer solution USE 1 VIAL VIA NEBULIZER EVERY 4 HOURS AS NEEDED FOR WHEEZING OR SHORTNESS OF BREATH  . metoprolol succinate (TOPROL-XL) 25 MG 24 hr tablet  Take 1 tablet (25 mg total) by mouth daily.  . mometasone (NASONEX) 50 MCG/ACT nasal spray Place 2 sprays into the nose daily.  . Multiple Vitamins-Minerals (PRESERVISION AREDS 2) CAPS Take 1 capsule by mouth daily.  . nitroGLYCERIN (NITROSTAT) 0.4 MG SL tablet Place 1 tablet (0.4 mg total) under the tongue every 5 (five) minutes as needed for chest pain.  . OXYGEN Inhale 2 L continuous into the lungs.   . predniSONE (DELTASONE) 10 MG tablet FOR FLARE OF COUGH/WHEEZING MAY INCREASE TO 2 DAILY BETTER, THEN 1 DAILY  . sodium chloride (MURO 128) 5 % ophthalmic ointment 1 drop.   Marland Kitchen spironolactone (ALDACTONE) 25 MG tablet TAKE 1/2 TABLET BY MOUTH EVERY DAY  . SYMBICORT 160-4.5 MCG/ACT inhaler Inhale 2 puffs into the lungs 2 (two) times daily.  . Tiotropium Bromide Monohydrate (SPIRIVA RESPIMAT) 2.5 MCG/ACT AERS INHALE 2 PUFFS BY MOUTH EVERY DAY  . vitamin B-12 (CYANOCOBALAMIN) 1000 MCG tablet Take 1,000 mcg by mouth daily.      Allergies:   Cefdinir, Nitrofurantoin, Sulfa antibiotics, Sulfonamide derivatives, Cefdinir, Ciprofloxacin, Nitrofurantoin, Amoxicillin er, Doxycycline, Sertraline, Ciprofloxacin, Levaquin [levofloxacin], and Levaquin [levofloxacin]   Social History   Socioeconomic History  . Marital status: Married    Spouse name: Not on file  . Number of children: 2  . Years of education: Not on file  . Highest education level: Not on file  Occupational History  . 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 Smoking status: Former Smoker    Packs/day: 2.00    Years: 33.00    Pack years: 66.00    Types: Cigarettes    Quit date: 03/25/1982    Years since quitting: 37.1  . Smokeless tobacco: Never Used  Substance and Sexual Activity  . Alcohol use: Yes    Alcohol/week: 0.0 standard drinks    Comment: 1.5-3 oz daily in the past, minimal use in the last few months  . Drug use: No  . Sexual activity: Not on file  Other Topics Concern  .  Not on file  Social History Narrative   ** Merged History Encounter **       Pt lives at home with his spouse.         Social Determinants of Health   Financial Resource Strain:   . Difficulty of Paying Living Expenses:   Food Insecurity:   . Worried About Charity fundraiser in the Last Year:   . Arboriculturist in the Last Year:   Transportation Needs:   . Film/video editor (Medical):   Marland Kitchen Lack of Transportation (Non-Medical):   Physical Activity:   .  Days of Exercise per Week:   . Minutes of Exercise per Session:   Stress:   . Feeling of Stress :   Social Connections:   . Frequency of Communication with Friends and Family:   . Frequency of Social Gatherings with Friends and Family:   . Attends Religious Services:   . Active Member of Clubs or Organizations:   . Attends Archivist Meetings:   Marland Kitchen Marital Status:      Family History: The patient's family history includes Allergies in his brother; Breast cancer in his mother; Cancer in his mother; Heart attack in his father; Heart disease in his brother, brother, father, and mother; Hyperlipidemia in his brother; Hypertension in his brother and mother; Other in his mother; Peripheral vascular disease in his brother; Stroke in his brother.  ROS:   Please see the history of present illness.    Depression.  Significant difficulty sleeping due to leg pain.  Chronic lower extremity pain that is positional.  Chronic shortness of breath.  Now not ambulating.  He denies any significant angina or nitroglycerin use.  No palpitations.  All other systems reviewed and are negative.  EKGs/Labs/Other Studies Reviewed:    The following studies were reviewed today: No new cardiac imaging. Right lower extremity arterial Doppler April 2021: Summary:  Right: 75-99% stenosis noted in the common femoral artery. Total occlusion  noted in the superficial femoral artery with minimal reconstitution of the  popliteal artery.       See table(s) above for measurements and observations.    EKG:  EKG not performed today  Recent Labs: 07/10/2018: NT-Pro BNP 648 12/20/2018: Hemoglobin 13.1; Platelets 230 03/05/2019: ALT 23 03/27/2019: BUN 22; Creatinine, Ser 1.09; Magnesium 2.3; Potassium 4.8; Sodium 142  Recent Lipid Panel    Component Value Date/Time   CHOL 146 03/26/2017 0621   TRIG 34 03/26/2017 0621   HDL 79 03/26/2017 0621   CHOLHDL 1.8 03/26/2017 0621   VLDL 7 03/26/2017 0621   LDLCALC 60 03/26/2017 0621    Physical Exam:    VS:  BP (!) 152/74   Pulse 71   Ht 5\' 9"  (1.753 m)   Wt 137 lb 12.8 oz (62.5 kg)   SpO2 97%   BMI 20.35 kg/m     Wt Readings from Last 3 Encounters:  04/23/19 137 lb 12.8 oz (62.5 kg)  04/16/19 139 lb (63 kg)  03/27/19 139 lb 3.2 oz (63.1 kg)     GEN: Frail and compatible with age. No acute distress HEENT: Normal NECK: No JVD. LYMPHATICS: No lymphadenopathy CARDIAC:  RRR without murmur, gallop, with left greater than right lower extremity edema. VASCULAR: Has an Unna boot on the right lower extremity.  Normal Pulses. No bruits. RESPIRATORY:  Clear to auscultation without rales, wheezing or rhonchi  ABDOMEN: Soft, non-tender, non-distended, No pulsatile mass, MUSCULOSKELETAL: No deformity  SKIN: Warm and dry NEUROLOGIC:  Alert and oriented x 3 PSYCHIATRIC:  Normal affect   ASSESSMENT:    1. Coronary artery disease involving coronary bypass graft of native heart without angina pectoris   2. Cor pulmonale, chronic (HCC)/ clinical dx    3. Essential hypertension   4. Acute on chronic systolic heart failure ([place])   5. Peripheral vascular disease ([place])   6. Mixed hyperlipidemia   7. Educated about COVID-19 virus infection    PLAN:    In order of problems listed above:  1. Stable without angina.  Reviewed secondary prevention. 2. Continue chronic O2 therapy.  Continue low-dose diuretic therapy. 3. Blood pressure considering lower extremity arterial occlusion is  reasonable.  Wanted to be somewhat on the high side. 4. No evidence of volume overload.  Lower extremity edema is related to dependency.  Dependency is preferred because it decreases arterial related ischemic pain 5. Ischemic ulcer right lower extremity.  Total occlusion of the right superficial femoral artery. 6. LDL target less than 70 is being achieved.  Continue statin therapy. 7. COVID-19 vaccine has been received.  Mask wearing and social distancing is being achieved.   Medication Adjustments/Labs and Tests Ordered: Current medicines are reviewed at length with the patient today.  Concerns regarding medicines are outlined above.  No orders of the defined types were placed in this encounter.  No orders of the defined types were placed in this encounter.   Patient Instructions  Medication Instructions:  Your physician recommends that you continue on your current medications as directed. Please refer to the Current Medication list given to you today.  *If you need a refill on your cardiac medications before your next appointment, please call your pharmacy*   Lab Work: None If you have labs (blood work) drawn today and your tests are completely normal, you will receive your results only by: Marland Kitchen MyChart Message (if you have MyChart) OR . A paper copy in the mail If you have any lab test that is abnormal or we need to change your treatment, we will call you to review the results.   Testing/Procedures: None   Follow-Up: At [facility], you and your health needs are our priority.  As part of our continuing mission to provide you with exceptional heart care, we have created designated Provider Care Teams.  These Care Teams include your primary Cardiologist (physician) and Advanced Practice Providers (APPs -  Physician Assistants and Nurse Practitioners) who all work together to provide you with the care you need, when you need it.  We recommend signing up for the patient portal  called "MyChart".  Sign up information is provided on this After Visit Summary.  MyChart is used to connect with patients for Virtual Visits (Telemedicine).  Patients are able to view lab/test results, encounter notes, upcoming appointments, etc.  Non-urgent messages can be sent to your provider as well.   To learn more about what you can do with MyChart, go to [email or web address].    Your next appointment:   6 month(s)  The format for your next appointment:   In Person  Provider:   You may see Sinclair Grooms, MD or one of the following Advanced Practice Providers on your designated Care Team:    [name], NP  Cecilie Kicks, NP  Kathyrn Drown, NP    Other Instructions      Signed, Sinclair Grooms, MD  04/23/2019 3:39 PM    [place]

## 2019-04-22 NOTE — Progress Notes (Signed)
Travis, [name] ([id number]) Visit Report for 04/21/2019 Chief Complaint Document Details Patient Name: Date of Service: [name], [name] 04/21/2019 10:30 AM Medical Record [id number] Patient Account Number: [id number] Date of Birth/Sex: Treating RN: May 02, 1929 (86 y.o. [name] Primary Care Provider: [name] Other Clinician: Referring Provider: Treating Provider/Extender:Laronda Lisby, Archie Patten, Docia Barrier in Treatment: 0 Information Obtained from: Patient Chief Complaint Right forearm skin tear 04/21/2019; patient is here for review of wound on the right medial great toe Electronic Signature(s) Signed: 04/22/2019 5:51:00 PM By: [name] MD Entered By: [name] on 04/21/2019 12:16:48 -------------------------------------------------------------------------------- Debridement Details Patient Name: Date of Service: [facility] [name], Travis L. 04/21/2019 10:30 AM Medical Record [id number] Patient Account Number: [id number] Date of Birth/Sex: [birth year] (86 y.o. M) Treating RN: Levan Hurst Primary Care Provider: [name] Other Clinician: Referring Provider: Treating Provider/Extender:Raney Antwine, Archie Patten, CHARLES Weeks in Treatment: 0 Debridement Performed for Wound #2 Right Toe Great Assessment: Performed By: Clinician Levan Hurst, RN Debridement Type: Chemical/Enzymatic/Mechanical Agent Used: wound cleanser Severity of Tissue Pre Fat layer exposed Debridement: Level of Consciousness (Pre- Awake and Alert procedure): Pre-procedure Verification/Time Out Taken: No Start Time: 12:00 Instrument: Other : gauze Bleeding: None End Time: 12:00 Procedural Pain: 0 Post Procedural Pain: 0 Response to Treatment: Procedure was tolerated well Level of Consciousness Awake and Alert (Post-procedure): Post Debridement Measurements of Total Wound Length: (cm) 3.3 Width: (cm) 0.5 Depth: (cm) 0.1 Volume: (cm) 0.13 Character of Wound/Ulcer  Post Stable Debridement: Severity of Tissue Post Debridement: Fat layer exposed Post Procedure Diagnosis Same as Pre-procedure Electronic Signature(s) Signed: 04/21/2019 6:12:10 PM By: Levan Hurst RN, BSN Signed: 04/22/2019 5:51:00 PM By: [name] MD Entered By: Levan Hurst on 04/21/2019 11:58:36 -------------------------------------------------------------------------------- HPI Details Patient Name: Date of Service: [name], Travis L. 04/21/2019 10:30 AM Medical Record [id number] Patient Account Number: [id number] Date of Birth/Sex: Treating RN: 06-Apr-1929 (86 y.o. [name] Primary Care Provider: [name] Other Clinician: Referring Provider: Treating Provider/Extender:Cheyna Retana, Archie Patten, Docia Barrier in Treatment: 0 History of Present Illness HPI Description: 01/30/18 on evaluation today patient presents for initial inspection concerning the skin tear of the right forearm. Fortunately there does not appear to be any evidence of infection and this occurred approximately three days ago. He has been using Vaseline over the region. With that being said the patient does have a history of cataracts COPD, hypertension, peripheral arterial disease, and osteoarthritis. He tells me that he does have a little bit of pain at the site but nothing too significant at this time which is good news. Fortunately this overall appears to be doing fairly well which is good news. No fevers, chills, nausea, or vomiting noted at this time. The biggest issue at this point is that the wound is of quite significant size. READMISSION 04/21/2019 This is a 86 year old man with multiple medical problems who was seen once in the clinic here in January 2020 with a skin tear on his right forearm seen by [name]. This problem this time started sometime recently. He was noted by podiatry to have erosions and wounds of his right foot on toes 1-3. He also saw Dr. [name] of dermatology who  gave him Silvadene cream for what I think was felt to be a stasis dermatitis of his bilateral lower legs. At some point he was referred to [name]. He was also noted to have previous noninvasive arterial studies that showed no flow to either one of his toes and noncompressible ABIs bilaterally. [name] did noninvasive arterial studies on  him. These showed that he had greater than 75% stenosis in the proximal and mid common femoral artery. Monophasic flow in the posterior tibial and dorsalis pedis positions on the right foot. ABIs again were noncompressible. He was felt to have total occlusion of the superficial femoral artery on the right. The overall interpretation is that he had severe multilevel arterial occlusive disease. Absent femoral pulses and superficial femoral artery occlusions. In spite of this he was not felt to be candidate for arteriography given his bilateral femoral artery occlusions as well he would not be a candidate for endovascular approach secondary to his common femoral artery occlusions and proximal iliac disease. He was not felt to be a candidate for an open infra inguinal bypass. He therefore felt he would need to be monitored over time with the only option being a right below-knee amputation The patient complains currently of pain at night when he is up in bed. The pain in the right foot is better when he puts the leg down over the bed side. This is compatible with rest pain. He has very limited activity i.e. is even exhausted getting dressed in the morning because of cardiopulmonary issues therefore it is difficult to gauge claudication I believe he was referred here by Dr. [name] with one of the major issues is whether he would be a candidate for hyperbaric oxygen. He is not a diabetic. Dr. [name] did give him Silvadene cream for dry flaking skin in his bilateral lower extremities, however the patient is concerned about using this in the face of sulfa allergy.  Podiatry had previously given him in a steroid cream I do not believe these use this either Past medical history is extensive including basal cell and squamous cell skin cancers, combined congestive heart failure, O2 dependent COPD, cor pulmonale, venous stasis dermatitis, peripheral arterial disease, hearing loss, Electronic Signature(s) Signed: 04/22/2019 5:51:00 PM By: [name] MD Entered By: [name] on 04/21/2019 12:26:05 -------------------------------------------------------------------------------- Physical Exam Details Patient Name: Date of Service: [facility], Raydell L. 04/21/2019 10:30 AM Medical Record [id number] Patient Account Number: [id number] Date of Birth/Sex: Treating RN: 10/05/1929 (86 y.o. [name] Primary Care Provider: [name] Other Clinician: Referring Provider: Treating Provider/Extender:Baldomero Mirarchi, Archie Patten, Docia Barrier in Treatment: 0 Constitutional Patient is hypertensive.. Pulse regular and within target range for patient.Marland Kitchen Respirations regular, non-labored and within target range.. Temperature is normal and within the target range for the patient.Marland Kitchen Appears in no distress. Eyes Conjunctivae clear. No discharge.no icterus. Respiratory work of breathing is normal. Cardiovascular JVP is elevated no murmurs no S3. Pedal pulses absent bilaterally.. Edema in both lower extremities. This is pitting. Integumentary (Hair, Skin) Extremely dry flaking skin in the bilateral lower feet and lower legs. No rashes seen. Psychiatric appears at normal baseline. Notes Wound exam; the areas on the medial right great toe. Superficial wound but down the entire side of this. Inferior to this I think there is a second area that could be at risk. The wound was vigorously debrided with wound cleanser and gauze. No mechanical debridement was necessary Electronic Signature(s) Signed: 04/22/2019 5:51:00 PM By: [name] MD Entered By: [name] on 04/21/2019 12:35:28 -------------------------------------------------------------------------------- Physician Orders Details Patient Name: Date of Service: [facility], Jabreel L. 04/21/2019 10:30 AM Medical Record [id number] Patient Account Number: [id number] Date of Birth/Sex: Treating RN: [birth year] (86 y.o. [name] Primary Care Provider: [name] Other Clinician: Referring Provider: Treating Provider/Extender:Keerstin Bjelland, Archie Patten, Docia Barrier in Treatment: 0 Verbal / Phone Orders: No Diagnosis Coding  Follow-up Appointments Return Appointment in 2 weeks. Dressing Change Frequency Wound #2 Right Toe Great Change Dressing every other day. Skin Barriers/Peri-Wound Care Moisturizing lotion - to both legs and feet daily Wound Cleansing Wound #2 Right Toe Great May shower and wash wound with soap and water. - on days that dressing is changed Primary Wound Dressing Wound #2 Right Toe Great Silver Collagen - moisten with hydrogel or KY jelly Secondary Dressing Wound #2 Right Toe Great Foam Kerlix/Rolled Gauze Dry Gauze Off-Loading Open toe surgical shoe to: - right foot Electronic Signature(s) Signed: 04/21/2019 6:12:10 PM By: Levan Hurst RN, BSN Signed: 04/22/2019 5:51:00 PM By: [name] MD Entered By: Levan Hurst on 04/21/2019 11:59:21 -------------------------------------------------------------------------------- Problem List Details Patient Name: Date of Service: [facility], Isahia L. 04/21/2019 10:30 AM Medical Record [id number] Patient Account Number: [id number] Date of Birth/Sex: Treating RN: 10/10/1929 (86 y.o. [name] Primary Care Provider: [name] Other Clinician: Referring Provider: Treating Provider/Extender:Kenedy Haisley, Archie Patten, Docia Barrier in Treatment: 0 Active Problems ICD-10 Evaluated Encounter Code Description Active Date Today Diagnosis I70.235 Atherosclerosis of native arteries of right leg with  04/21/2019 No Yes ulceration of other part of foot L97.511 Non-pressure chronic ulcer of other part of right foot 04/21/2019 No Yes limited to breakdown of skin I87.323 Chronic venous hypertension (idiopathic) with 04/21/2019 No Yes inflammation of bilateral lower extremity Inactive Problems Resolved Problems Electronic Signature(s) Signed: 04/22/2019 5:51:00 PM By: [name] MD Entered By: [name] on 04/21/2019 12:14:03 -------------------------------------------------------------------------------- Progress Note Details Patient Name: Date of Service: [facility], Huzaifa L. 04/21/2019 10:30 AM Medical Record [id number] Patient Account Number: [id number] Date of Birth/Sex: Treating RN: [birth year] (86 y.o. [name] Primary Care Provider: [name] Other Clinician: Referring Provider: Treating Provider/Extender:Arian Murley, Archie Patten, Docia Barrier in Treatment: 0 Subjective Chief Complaint Information obtained from Patient Right forearm skin tear 04/21/2019; patient is here for review of wound on the right medial great toe History of Present Illness (HPI) 01/30/18 on evaluation today patient presents for initial inspection concerning the skin tear of the right forearm. Fortunately there does not appear to be any evidence of infection and this occurred approximately three days ago. He has been using Vaseline over the region. With that being said the patient does have a history of cataracts COPD, hypertension, peripheral arterial disease, and osteoarthritis. He tells me that he does have a little bit of pain at the site but nothing too significant at this time which is good news. Fortunately this overall appears to be doing fairly well which is good news. No fevers, chills, nausea, or vomiting noted at this time. The biggest issue at this point is that the wound is of quite significant size. READMISSION 04/21/2019 This is a 86 year old man with multiple medical  problems who was seen once in the clinic here in January 2020 with a skin tear on his right forearm seen by [name]. This problem this time started sometime recently. He was noted by podiatry to have erosions and wounds of his right foot on toes 1-3. He also saw Dr. [name] of dermatology who gave him Silvadene cream for what I think was felt to be a stasis dermatitis of his bilateral lower legs. At some point he was referred to [name]. He was also noted to have previous noninvasive arterial studies that showed no flow to either one of his toes and noncompressible ABIs bilaterally. [name] did noninvasive arterial studies on him. These showed that he had greater than 75% stenosis in the proximal  and mid common femoral artery. Monophasic flow in the posterior tibial and dorsalis pedis positions on the right foot. ABIs again were noncompressible. He was felt to have total occlusion of the superficial femoral artery on the right. The overall interpretation is that he had severe multilevel arterial occlusive disease. Absent femoral pulses and superficial femoral artery occlusions. In spite of this he was not felt to be candidate for arteriography given his bilateral femoral artery occlusions as well he would not be a candidate for endovascular approach secondary to his common femoral artery occlusions and proximal iliac disease. He was not felt to be a candidate for an open infra inguinal bypass. He therefore felt he would need to be monitored over time with the only option being a right below-knee amputation The patient complains currently of pain at night when he is up in bed. The pain in the right foot is better when he puts the leg down over the bed side. This is compatible with rest pain. He has very limited activity i.e. is even exhausted getting dressed in the morning because of cardiopulmonary issues therefore it is difficult to gauge claudication I believe he was referred here by Dr.  [name] with one of the major issues is whether he would be a candidate for hyperbaric oxygen. He is not a diabetic. Dr. [name] did give him Silvadene cream for dry flaking skin in his bilateral lower extremities, however the patient is concerned about using this in the face of sulfa allergy. Podiatry had previously given him in a steroid cream I do not believe these use this either Past medical history is extensive including basal cell and squamous cell skin cancers, combined congestive heart failure, O2 dependent COPD, cor pulmonale, venous stasis dermatitis, peripheral arterial disease, hearing loss, Patient History Information obtained from Patient. Allergies cefdinir (Severity: Severe, Reaction: severe diarrhea), nitrofurantoin (Severity: Moderate, Reaction: hand swelling), Sulfa (Sulfonamide Antibiotics) (Severity: Severe, Reaction: facial/tongue swelling), ciprofloxacin (Severity: Moderate, Reaction: itching, rash), doxycycline (Reaction: "felt terrible"), sertraline (Severity: Moderate, Reaction: jittery), Levaquin (Severity: Moderate, Reaction: itching, rash), amoxicillin (Reaction: frequent urination) Family History Cancer - Mother, Hypertension - Mother,Siblings, Stroke - Siblings, No family history of Diabetes, Heart Disease, Hereditary Spherocytosis, Kidney Disease, Lung Disease, Seizures, Thyroid Problems, Tuberculosis. Social History Former smoker - quit 1984 - ended on 01/09/1982, Marital Status - Married, Alcohol Use - Moderate, Drug Use - No History, Caffeine Use - Daily. Medical History Eyes Patient has history of Cataracts Denies history of Glaucoma, Optic Neuritis Ear/Nose/Mouth/Throat Denies history of Chronic sinus problems/congestion, Middle ear problems Hematologic/Lymphatic Denies history of Anemia, Hemophilia, Human Immunodeficiency Virus, Lymphedema, Sickle Cell Disease Respiratory Patient has history of Asthma, Chronic Obstructive Pulmonary Disease  (COPD) Denies history of Aspiration, Pneumothorax, Sleep Apnea, Tuberculosis Cardiovascular Patient has history of Congestive Heart Failure, Coronary Artery Disease, Hypertension, Peripheral Arterial Disease Denies history of Angina, Arrhythmia, Deep Vein Thrombosis, Hypotension, Peripheral Venous Disease, Phlebitis, Vasculitis Gastrointestinal Denies history of Cirrhosis , Colitis, Crohnoos, Hepatitis A, Hepatitis B, Hepatitis C Endocrine Denies history of Type I Diabetes, Type II Diabetes Genitourinary Denies history of End Stage Renal Disease Immunological Denies history of Lupus Erythematosus, Raynaudoos, Scleroderma Integumentary (Skin) Denies history of History of Burn Musculoskeletal Patient has history of Osteoarthritis Denies history of Gout, Rheumatoid Arthritis, Osteomyelitis Neurologic Denies history of Dementia, Neuropathy, Quadriplegia, Paraplegia, Seizure Disorder Oncologic Denies history of Received Chemotherapy, Received Radiation Psychiatric Patient has history of Confinement Anxiety Denies history of Anorexia/bulimia Hospitalization/Surgery History - copd. - seizure. - CABG 4 vessels. Medical And  Surgical History Notes Ear/Nose/Mouth/Throat chronic rhinitis Cardiovascular carotid artery occlusion, hyperlipidemia Gastrointestinal GERD, gallstones Genitourinary BPH, hx kidney stones, ureteral tumor Oncologic squamous cell carcinoma of skin Review of Systems (ROS) Constitutional Symptoms (General Health) Complains or has symptoms of Fatigue. Eyes Complains or has symptoms of Glasses / Contacts. Ear/Nose/Mouth/Throat Complains or has symptoms of Chronic sinus problems or rhinitis. Respiratory Complains or has symptoms of Shortness of Breath. Denies complaints or symptoms of Chronic or frequent coughs. Gastrointestinal Denies complaints or symptoms of Frequent diarrhea, Nausea, Vomiting. Endocrine Denies complaints or symptoms of Heat/cold  intolerance. Integumentary (Skin) Complains or has symptoms of Wounds - right great toe. Musculoskeletal Complains or has symptoms of Muscle Weakness. Neurologic Denies complaints or symptoms of Numbness/parasthesias. Psychiatric Complains or has symptoms of Claustrophobia. Denies complaints or symptoms of Suicidal. Objective Constitutional Patient is hypertensive.. Pulse regular and within target range for patient.Marland Kitchen Respirations regular, non-labored and within target range.. Temperature is normal and within the target range for the patient.Marland Kitchen Appears in no distress. Vitals Time Taken: 10:50 AM, Height: 69 in, Source: Stated, Weight: 132 lbs, Source: Stated, BMI: 19.5, Temperature: 98 F, Pulse: 96 bpm, Respiratory Rate: 20 breaths/min, Blood Pressure: 154/75 mmHg. Eyes Conjunctivae clear. No discharge.no icterus. Respiratory work of breathing is normal. Cardiovascular JVP is elevated no murmurs no S3. Pedal pulses absent bilaterally.. Edema in both lower extremities. This is pitting. Psychiatric appears at normal baseline. General Notes: Wound exam; the areas on the medial right great toe. Superficial wound but down the entire side of this. Inferior to this I think there is a second area that could be at risk. The wound was vigorously debrided with wound cleanser and gauze. No mechanical debridement was necessary Integumentary (Hair, Skin) Extremely dry flaking skin in the bilateral lower feet and lower legs. No rashes seen. Wound #2 status is Open. Original cause of wound was Gradually Appeared. The wound is located on the Right Toe Great. The wound measures 3.3cm length x 0.5cm width x 0.1cm depth; 1.296cm^2 area and 0.13cm^3 volume. There is Fat Layer (Subcutaneous Tissue) Exposed exposed. There is no tunneling or undermining noted. There is a small amount of serosanguineous drainage noted. The wound margin is flat and intact. There is small (1-33%) red granulation within the  wound bed. There is a small (1-33%) amount of necrotic tissue within the wound bed including Adherent Slough. Assessment Active Problems ICD-10 Atherosclerosis of native arteries of right leg with ulceration of other part of foot Non-pressure chronic ulcer of other part of right foot limited to breakdown of skin Chronic venous hypertension (idiopathic) with inflammation of bilateral lower extremity Procedures Wound #2 Pre-procedure diagnosis of Wound #2 is an Arterial Insufficiency Ulcer located on the Right Toe Great .Severity of Tissue Pre Debridement is: Fat layer exposed. There was a Chemical/Enzymatic/Mechanical debridement performed by Levan Hurst, RN. With the following instrument(s): gauze. Other agent used was wound cleanser. There was no bleeding. The procedure was tolerated well with a pain level of 0 throughout and a pain level of 0 following the procedure. Post Debridement Measurements: 3.3cm length x 0.5cm width x 0.1cm depth; 0.13cm^3 volume. Character of Wound/Ulcer Post Debridement is stable. Severity of Tissue Post Debridement is: Fat layer exposed. Post procedure Diagnosis Wound #2: Same as Pre-Procedure Plan Follow-up Appointments: Return Appointment in 2 weeks. Dressing Change Frequency: Wound #2 Right Toe Great: Change Dressing every other day. Skin Barriers/Peri-Wound Care: Moisturizing lotion - to both legs and feet daily Wound Cleansing: Wound #2 Right Toe Great: May shower  and wash wound with soap and water. - on days that dressing is changed Primary Wound Dressing: Wound #2 Right Toe Great: Silver Collagen - moisten with hydrogel or KY jelly Secondary Dressing: Wound #2 Right Toe Great: Foam Kerlix/Rolled Gauze Dry Gauze Off-Loading: Open toe surgical shoe to: - right foot 1. Patient with severe inoperable peripheral vascular disease. He has superficial wounds on the medial part of the right great toe. Fortunately for the moment this does not look  too bad. We elected to use moistened silver collagen with hydrogel, gauze and offloading this in the surgical shoe. 2. For several reasons he is not a candidate for hyperbaric oxygen which was one of the reasons he was sent here. He does not have qualifying wounds for one thing, but even if he did I think his cardiopulmonary status would preclude him from consideration. 3. We will see if he has enough blood flow to improve these wounds with a standard dressing and pressure relief. I emphasized the latter 4. Extremely dry skin with bilateral pitting edema. I suspect he does have chronic venous insufficiency. He uses a zippered stocking at home. I emphasized standard skin lubrication with a moisturizer. We could consider adding a mild steroid to his skin moisturizer if this does not work. I spent 45 minutes in review of this patient's records, direct face-to-face evaluation and preparation of this record Electronic Signature(s) Signed: 04/21/2019 12:36:06 PM By: [name] MD Entered By: [name] on 04/21/2019 12:36:05 -------------------------------------------------------------------------------- HxROS Details Patient Name: Date of Service: [facility], Vineet L. 04/21/2019 10:30 AM Medical Record [id number] Patient Account Number: [id number] Date of Birth/Sex: Treating RN: 1929-09-02 (86 y.o. [name] Primary Care Provider: [name] Other Clinician: Referring Provider: Treating Provider/Extender:Adrianne Shackleton, Archie Patten, CHARLES Weeks in Treatment: 0 Information Obtained From Patient Constitutional Symptoms (Celebration) Complaints and Symptoms: Positive for: Fatigue Eyes Complaints and Symptoms: Positive for: Glasses / Contacts Medical History: Positive for: Cataracts Negative for: Glaucoma; Optic Neuritis Ear/Nose/Mouth/Throat Complaints and Symptoms: Positive for: Chronic sinus problems or rhinitis Medical History: Negative for: Chronic sinus  problems/congestion; Middle ear problems Past Medical History Notes: chronic rhinitis Respiratory Complaints and Symptoms: Positive for: Shortness of Breath Negative for: Chronic or frequent coughs Medical History: Positive for: Asthma; Chronic Obstructive Pulmonary Disease (COPD) Negative for: Aspiration; Pneumothorax; Sleep Apnea; Tuberculosis Gastrointestinal Complaints and Symptoms: Negative for: Frequent diarrhea; Nausea; Vomiting Medical History: Negative for: Cirrhosis ; Colitis; Crohns; Hepatitis A; Hepatitis B; Hepatitis C Past Medical History Notes: GERD, gallstones Endocrine Complaints and Symptoms: Negative for: Heat/cold intolerance Medical History: Negative for: Type I Diabetes; Type II Diabetes Integumentary (Skin) Complaints and Symptoms: Positive for: Wounds - right great toe Medical History: Negative for: History of Burn Musculoskeletal Complaints and Symptoms: Positive for: Muscle Weakness Medical History: Positive for: Osteoarthritis Negative for: Gout; Rheumatoid Arthritis; Osteomyelitis Neurologic Complaints and Symptoms: Negative for: Numbness/parasthesias Medical History: Negative for: Dementia; Neuropathy; Quadriplegia; Paraplegia; Seizure Disorder Psychiatric Complaints and Symptoms: Positive for: Claustrophobia Negative for: Suicidal Medical History: Positive for: Confinement Anxiety Negative for: Anorexia/bulimia Hematologic/Lymphatic Medical History: Negative for: Anemia; Hemophilia; Human Immunodeficiency Virus; Lymphedema; Sickle Cell Disease Cardiovascular Medical History: Positive for: Congestive Heart Failure; Coronary Artery Disease; Hypertension; Peripheral Arterial Disease Negative for: Angina; Arrhythmia; Deep Vein Thrombosis; Hypotension; Peripheral Venous Disease; Phlebitis; Vasculitis Past Medical History Notes: carotid artery occlusion, hyperlipidemia Genitourinary Medical History: Negative for: End Stage Renal  Disease Past Medical History Notes: BPH, hx kidney stones, ureteral tumor Immunological Medical History: Negative for: Lupus Erythematosus; Raynauds; Scleroderma Oncologic Medical  History: Negative for: Received Chemotherapy; Received Radiation Past Medical History Notes: squamous cell carcinoma of skin HBO Extended History Items Eyes: Cataracts Immunizations Pneumococcal Vaccine: Received Pneumococcal Vaccination: Yes Implantable Devices No devices added Hospitalization / Surgery History Type of Hospitalization/Surgery copd seizure CABG 4 vessels Family and Social History Cancer: Yes - Mother; Diabetes: No; Heart Disease: No; Hereditary Spherocytosis: No; Hypertension: Yes - Mother,Siblings; Kidney Disease: No; Lung Disease: No; Seizures: No; Stroke: Yes - Siblings; Thyroid Problems: No; Tuberculosis: No; Former smoker - quit 1984 - ended on 01/09/1982; Marital Status - Married; Alcohol Use: Moderate; Drug Use: No History; Caffeine Use: Daily; Financial Concerns: No; Food, Clothing or Shelter Needs: No; Support System Lacking: No; Transportation Concerns: No Electronic Signature(s) Signed: 04/21/2019 5:40:42 PM By: Kela Millin Signed: 04/22/2019 5:51:00 PM By: [name] MD Entered By: Kela Millin on 04/21/2019 11:10:57 -------------------------------------------------------------------------------- SuperBill Details Patient Name: Date of Service: [name] 04/21/2019 Medical Record [id number] Patient Account Number: [id number] Date of Birth/Sex: Treating RN: 07-Nov-1929 (86 y.o. [name] Primary Care Provider: [name] Other Clinician: Referring Provider: Treating Provider/Extender:Alistair Senft, Archie Patten, CHARLES Weeks in Treatment: 0 Diagnosis Coding ICD-10 Codes Code Description [id number] Atherosclerosis of native arteries of right leg with ulceration of other part of foot L97.511 Non-pressure chronic ulcer of other part of right  foot limited to breakdown of skin I87.323 Chronic venous hypertension (idiopathic) with inflammation of bilateral lower extremity Facility Procedures CPT4 Code: [id number] Description: [facility] VISIT-LEV 3 EST PT Modifier: 25 Quantity: 1 CPT4 Code: [id number] Description: [id number] - [place] W/O ANES NON SELECT Modifier: Quantity: 1 Physician Procedures CPT4 Code Description: [id number] - WC PHYS LEVEL 4 - NEW PT ICD-10 Diagnosis Description I70.235 Atherosclerosis of native arteries of right leg with ulce L97.511 Non-pressure chronic ulcer of other part of right foot li I87.323 Chronic venous  hypertension (idiopathic) with inflammatio extremity Modifier: ration of other pa mited to breakdown n of bilateral low Quantity: 1 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

## 2019-04-22 NOTE — Progress Notes (Signed)
[facility] RN NOTE  PATIENT NAME: [name] DOB: March 16, 1929 MRN: [id number]  PRIMARY CARE PROVIDER: [name], MD  RESPONSIBLE PARTY: Chrsitopher Chasey (wife) Acct ID - Guarantor Home Phone Work Phone Relationship Acct Type  [id number] - [place] [name] [phone]  Self P/F     [address], [name], [place] [address]   Due to the COVID-19 crisis, this virtual check-in visit was done via telephone from my office and it was initiated and consent by this patient and or family.  PLAN OF CARE and INTERVENTION:  1. ADVANCE CARE PLANNING/GOALS OF CARE: Goal is for patient to feel better overall. He is a Full code.  2. PATIENT/CAREGIVER EDUCATION: Symptom management, wound management, safe mobility, s/s of infection 3. DISEASE STATUS: Virtual check-in visit completed via telephone. Patient is experiencing pain in both feet making ambulation difficult at times. He was recently seen by Vascular and Vein Specialist yesterday. He has a wound on his right great toe that is not healing. 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 He is not a candidate for revascularization surgery. If this progresses, he will need an above the knee amputation. Patient has an appointment with the Wound center next week on 4/01/14/19. Wife also voiced interest in having a wound nurse available to help with dressing changes and supplies. He is currently using Silvadene cream daily prescribed by his Dermatologist, but is reluctant to use because he states he has a sulfa allergy. Communication sent to our [place] to request assistance in getting a referral for this. She says that she placed a call to the Cardiologist because of the concern of his worsening edema. He is currently on Lasix daily with an additional dose on Mon, Wed, Fri. It was initially twice daily,  however patient felt that it caused increased weakness and lower blood pressures. He is requiring more assistance with ADLs. Wife states that patient is depressed about his current condition. He continues on continuous O2 at 2L/min via  and uses his Trilogy machine throughout the night. He has Xanax he takes for anxiety and to help ease breathing. Will continue to monitor.   HISTORY OF PRESENT ILLNESS: This is a 86 yo male with a h/o CHF, PVD, CAD COPD, and NSTEMI. Pallaitive care team continues to follow patient and visits monthly and PRN.   CODE STATUS: Full code ADVANCED DIRECTIVES: Y MOST FORM: no PPS: 50%   (Duration of visit and documentation 45 minutes)   [name], RN, BSN

## 2019-04-22 NOTE — Progress Notes (Signed)
[place], [name] ([id number]) Visit Report for 04/21/2019 Allergy List Details Patient Name: Date of Service: [name], [name] 04/21/2019 10:30 AM Medical Record [id number] Patient Account Number: [id number] Date of Birth/Sex: Treating RN: [birth year] (86 y.o. [name] Primary Care Codi Folkerts: [name] Other Clinician: Referring Bennie Chirico: Treating Maragret Vanacker/Extender:Robson, Archie Patten, CHARLES Weeks in Treatment: 0 Allergies Active Allergies cefdinir Reaction: severe diarrhea Severity: Severe nitrofurantoin Reaction: hand swelling Severity: Moderate Sulfa (Sulfonamide Antibiotics) Reaction: facial/tongue swelling Severity: Severe ciprofloxacin Reaction: itching, rash Severity: Moderate doxycycline Reaction: "felt terrible" sertraline Reaction: jittery Severity: Moderate Levaquin Reaction: itching, rash Severity: Moderate amoxicillin Reaction: frequent urination Allergy Notes Electronic Signature(s) Signed: 04/21/2019 5:40:42 PM By: Kela Millin Entered By: Kela Millin on 04/21/2019 10:58:23 -------------------------------------------------------------------------------- Arrival Information Details Patient Name: Date of Service: [facility], Travis L. 04/21/2019 10:30 AM Medical Record [id number] Patient Account Number: [id number] Date of Birth/Sex: Treating RN: 05-04-1929 (86 y.o. [name] Primary Care Satara Virella: [name] Other Clinician: Referring Franchon Ketterman: Treating Lakara Weiland/Extender:Robson, Archie Patten, Docia Barrier in Treatment: 0 Visit Information Patient Arrived: Wheel Chair Arrival Time: 10:40 Accompanied By: family member Transfer Assistance: None Patient Identification Verified: Yes Secondary Verification Process Yes Completed: Patient Has Alerts: Yes Patient Alerts: Right ABI: [place], TBI: 0 Left ABI: [place], TBI:0 History Since Last Visit Electronic Signature(s) Signed: 04/21/2019 5:40:42 PM By: Kela Millin Entered By: Kela Millin on 04/21/2019 10:49:40 -------------------------------------------------------------------------------- Clinic Level of Care Assessment Details Patient Name: Date of Service: PRIEST, [name] 04/21/2019 10:30 AM Medical Record [id number] Patient Account Number: [id number] Date of Birth/Sex: Treating RN: 1930/01/08 (86 y.o. [name] Primary Care Kaylean Tupou: [name] Other Clinician: Referring Sanaii Caporaso: Treating Elizebath Wever/Extender:Robson, Archie Patten, CHARLES Weeks in Treatment: 0 Clinic Level of Care Assessment Items TOOL 1 Quantity Score X - Use when EandM and Procedure is performed on INITIAL visit 1 0 ASSESSMENTS - Nursing Assessment / Reassessment X - General Physical Exam (combine w/ comprehensive assessment (listed just below) 1 20 when performed on new pt. evals) X - Comprehensive Assessment (HX, ROS, Risk Assessments, Wounds Hx, etc.) 1 25 ASSESSMENTS - Wound and Skin Assessment / Reassessment []  - Dermatologic / Skin Assessment (not related to wound area) 0 ASSESSMENTS - Ostomy and/or Continence Assessment and Care []  - Incontinence Assessment and Management 0 []  - Ostomy Care Assessment and Management (repouching, etc.) 0 PROCESS - Coordination of Care X - Simple Patient / Family Education for ongoing care 1 15 []  - Complex (extensive) Patient / Family Education for ongoing care 0 X - Staff obtains Programmer, systems, Records, Test Results / Process Orders 1 10 []  - Staff telephones HHA, Nursing Homes / Clarify orders / etc 0 []  - Routine Transfer to another Facility (non-emergent condition) 0 []  - Routine Hospital Admission (non-emergent condition) 0 X - New Admissions / Biomedical engineer / Ordering NPWT, Apligraf, etc. 1 15 []  - Emergency Hospital Admission (emergent condition) 0 PROCESS - Special Needs []  - Pediatric / Minor Patient Management 0 []  - Isolation Patient Management 0 []  - Hearing / Language / Visual  special needs 0 []  - Assessment of Community assistance (transportation, D/C planning, etc.) 0 []  - Additional assistance / Altered mentation 0 []  - Support Surface(s) Assessment (bed, cushion, seat, etc.) 0 INTERVENTIONS - Miscellaneous []  - External ear exam 0 []  - Patient Transfer (multiple staff / Civil Service fast streamer / Similar devices) 0 []  - Simple Staple / Suture removal (25 or less) 0 []  - Complex Staple / Suture removal (26 or more) 0 []  - Hypo/Hyperglycemic Management (do  not check if billed separately) 0 []  - Ankle / Brachial Index (ABI) - do not check if billed separately 0 Has the patient been seen at the hospital within the last three years: Yes Total Score: 85 Level Of Care: New/Established - Level 3 Electronic Signature(s) Signed: 04/21/2019 6:12:10 PM By: Levan Hurst RN, BSN Entered By: Levan Hurst on 04/21/2019 17:47:00 -------------------------------------------------------------------------------- Encounter Discharge Information Details Patient Name: Date of Service: [facility], Travis L. 04/21/2019 10:30 AM Medical Record [id number] Patient Account Number: [id number] Date of Birth/Sex: Treating RN: 1929/11/09 (86 y.o. [name] Primary Care Magdalina Whitehead: [name] Other Clinician: Referring Hillery Bhalla: Treating Mathews Stuhr/Extender:Robson, Archie Patten, Docia Barrier in Treatment: 0 Encounter Discharge Information Items Post Procedure Vitals Discharge Condition: Stable Temperature (F): 98 Ambulatory Status: Wheelchair Pulse (bpm): 96 Discharge Destination: Home Respiratory Rate (breaths/min): 20 Transportation: Private Auto Blood Pressure (mmHg): 154/75 Accompanied By: family member Schedule Follow-up Appointment: Yes Clinical Summary of Care: Electronic Signature(s) Signed: 04/21/2019 5:40:16 PM By: [name] Entered By: [name] on 04/21/2019 12:09:15 -------------------------------------------------------------------------------- Lower Extremity  Assessment Details Patient Name: Date of Service: KEWON, [name] 04/21/2019 10:30 AM Medical Record [id number] Patient Account Number: [id number] Date of Birth/Sex: Treating RN: 05-29-1929 (86 y.o. [name] Primary Care Alpha Mysliwiec: [name] Other Clinician: Referring Robyn Nohr: Treating Jearline Hirschhorn/Extender:Robson, Archie Patten, CHARLES Weeks in Treatment: 0 Edema Assessment Assessed: [Left: No] [Right: No] Edema: [Left: Yes] [Right: Yes] Calf Left: Right: Point of Measurement: cm From Medial Instep 35.3 cm 33 cm Ankle Left: Right: Point of Measurement: cm From Medial Instep 27.5 cm 25.5 cm Vascular Assessment Pulses: Dorsalis Pedis Palpable: [Left:No] [Right:No] Electronic Signature(s) Signed: 04/21/2019 5:40:42 PM By: Kela Millin Entered By: Kela Millin on 04/21/2019 11:20:44 -------------------------------------------------------------------------------- Multi Wound Chart Details Patient Name: Date of Service: [facility], [name] 04/21/2019 10:30 AM Medical Record [id number] Patient Account Number: [id number] Date of Birth/Sex: Treating RN: Aug 08, 1929 (86 y.o. [name] Primary Care Aithana Kushner: [name] Other Clinician: Referring Neema Fluegge: Treating Hannah Crill/Extender:Robson, Archie Patten, CHARLES Weeks in Treatment: 0 Vital Signs Height(in): 16 Pulse(bpm): 1 Weight(lbs): 132 Blood Pressure(mmHg): 154/75 Body Mass Index(BMI): 19 Temperature(F): 98 Respiratory 20 Rate(breaths/min): Photos: [2:No Photos] [N/A:N/A] Wound Location: [2:Right Toe Great] [N/A:N/A] Wounding Event: [2:Gradually Appeared] [N/A:N/A] Primary Etiology: [2:Arterial Insufficiency Ulcer N/A] Comorbid History: [2:Cataracts, Asthma, Chronic N/A Obstructive Pulmonary Disease (COPD), Congestive Heart Failure, Coronary Artery Disease, Hypertension, Peripheral Arterial Disease, Osteoarthritis, Confinement Anxiety] Date Acquired: [2:03/31/2019] [N/A:N/A] Weeks of  Treatment: [2:0] [N/A:N/A] Wound Status: [2:Open] [N/A:N/A] Measurements L x W x D 3.3x0.5x0.1 [N/A:N/A] (cm) Area (cm) : [2:1.296] [N/A:N/A] Volume (cm) : [2:0.13] [N/A:N/A] Classification: [2:Full Thickness Without Exposed Support Structures] [N/A:N/A] Exudate Amount: [2:Small] [N/A:N/A] Exudate Type: [2:Serosanguineous] [N/A:N/A] Exudate Color: [2:red, brown] [N/A:N/A] Wound Margin: [2:Flat and Intact] [N/A:N/A] Granulation Amount: [2:Small (1-33%)] [N/A:N/A] Granulation Quality: [2:Red] [N/A:N/A] Necrotic Amount: [2:Small (1-33%)] [N/A:N/A] Exposed Structures: [2:Fat Layer (Subcutaneous N/A Tissue) Exposed: Yes Fascia: No Tendon: No Muscle: No Joint: No Bone: No] Epithelialization: [2:Large (67-100%)] [N/A:N/A] Debridement: [2:Chemical/Enzymatic/Mechanical] [N/A:N/A] Instrument: [2:Other(gauze)] [N/A:N/A] Bleeding: [2:None] [N/A:N/A] Procedural Pain: [2:0] [N/A:N/A] Post Procedural Pain: [2:0] [N/A:N/A] Debridement Treatment [2:Procedure was tolerated N/A] Response: [2:well] Post Debridement [2:3.3x0.5x0.1] [N/A:N/A] Measurements L x W x D (cm) Post Debridement [2:0.13] [N/A:N/A] Volume: (cm) Procedures Performed: Debridement [N/A:N/A] Treatment Notes Wound #2 (Right Toe Great) 1. Cleanse With Wound Cleanser 3. Primary Dressing Applied Collegen AG Hydrogel or K-Y Jelly 4. Secondary Dressing Dry Gauze Roll Gauze Foam 5. Secured With Medipore tape 7. Footwear/Offloading device applied Surgical shoe Notes explained the orders, dressings, frequency  of change, and when to return to wound center. patient and family member in agreement. Electronic Signature(s) Signed: 04/21/2019 6:12:10 PM By: Levan Hurst RN, BSN Signed: 04/22/2019 5:51:00 PM By: [name] MD Entered By: [name] on 04/21/2019 12:14:24 -------------------------------------------------------------------------------- Multi-Disciplinary Care Plan Details Patient Name: Date of  Service: [facility], KAMARION CICALE 04/21/2019 10:30 AM Medical Record [id number] Patient Account Number: [id number] Date of Birth/Sex: Treating RN: [birth year] (86 y.o. [name] Primary Care Letecia Arps: [name] Other Clinician: Referring Rylan Kaufmann: Treating Maleea Camilo/Extender:Robson, Archie Patten, CHARLES Weeks in Treatment: 0 Active Inactive Abuse / Safety / Falls / Self Care Management Nursing Diagnoses: Potential for falls Potential for injury related to falls Goals: Patient will not experience any injury related to falls Date Initiated: 04/21/2019 Target Resolution Date: 05/23/2019 Goal Status: Active Patient/caregiver will verbalize/demonstrate measures taken to prevent injury and/or falls Date Initiated: 04/21/2019 Target Resolution Date: 05/23/2019 Goal Status: Active Interventions: Assess Activities of Daily Living upon admission and as needed Assess fall risk on admission and as needed Assess: immobility, friction, shearing, incontinence upon admission and as needed Assess impairment of mobility on admission and as needed per policy Assess personal safety and home safety (as indicated) on admission and as needed Assess self care needs on admission and as needed Provide education on fall prevention Provide education on personal and home safety Notes: Nutrition Nursing Diagnoses: Potential for alteratiion in Nutrition/Potential for imbalanced nutrition Goals: Patient/caregiver agrees to and verbalizes understanding of need to use nutritional supplements and/or vitamins as prescribed Date Initiated: 04/21/2019 Target Resolution Date: 05/23/2019 Goal Status: Active Interventions: Assess patient nutrition upon admission and as needed per policy Notes: Tissue Oxygenation Nursing Diagnoses: Actual ineffective tissue perfusion; peripheral (select once diagnosis is confirmed) Knowledge deficit related to disease process and management Goals: Patient/caregiver will  verbalize understanding of disease process and disease management Date Initiated: 04/21/2019 Target Resolution Date: 05/23/2019 Goal Status: Active Interventions: Assess patient understanding of disease process and management upon diagnosis and as needed Assess peripheral arterial status upon admission and as needed Provide education on tissue oxygenation and ischemia Notes: Wound/Skin Impairment Nursing Diagnoses: Impaired tissue integrity Knowledge deficit related to ulceration/compromised skin integrity Goals: Patient/caregiver will verbalize understanding of skin care regimen Date Initiated: 04/21/2019 Target Resolution Date: 05/23/2019 Goal Status: Active Ulcer/skin breakdown will have a volume reduction of 30% by week 4 Date Initiated: 04/21/2019 Target Resolution Date: 05/23/2019 Goal Status: Active Interventions: Assess patient/caregiver ability to obtain necessary supplies Assess patient/caregiver ability to perform ulcer/skin care regimen upon admission and as needed Assess ulceration(s) every visit Provide education on ulcer and skin care Notes: Electronic Signature(s) Signed: 04/21/2019 6:12:10 PM By: Levan Hurst RN, BSN Entered By: Levan Hurst on 04/21/2019 11:48:34 -------------------------------------------------------------------------------- Pain Assessment Details Patient Name: Date of Service: [facility], Yazeed L. 04/21/2019 10:30 AM Medical Record [id number] Patient Account Number: [id number] Date of Birth/Sex: Treating RN: April 05, 1929 (86 y.o. [name] Primary Care Albion Weatherholtz: [name] Other Clinician: Referring Tiari Andringa: Treating Ginger Leeth/Extender:Robson, Archie Patten, CHARLES Weeks in Treatment: 0 Active Problems Location of Pain Severity and Description of Pain Patient Has Paino Yes Site Locations Pain Location: Generalized Pain, Pain in Ulcers With Dressing Change: Yes Duration of the Pain. Constant / Intermittento  Intermittent Rate the pain. Current Pain Level: 5 Worst Pain Level: 8 Least Pain Level: 0 Character of Pain Describe the Pain: Aching, Sharp Pain Management and Medication Current Pain Management: Activity: Yes Leg drop or elevation: Yes Is the Current Pain Management Adequate: Adequate How does your wound impact your activities  of daily livingo Sleep: Yes Bathing: No Appetite: No Relationship With Others: No Bladder Continence: No Emotions: Yes Bowel Continence: No Work: No Toileting: No Drive: No Dressing: No Hobbies: Yes Electronic Signature(s) Signed: 04/21/2019 5:40:42 PM By: Kela Millin Entered By: Kela Millin on 04/21/2019 11:27:42 -------------------------------------------------------------------------------- Patient/Caregiver Education Details Patient Name: Date of Service: [name] 4/12/2021andnbsp10:30 AM Medical Record [phone] Patient Account Number: [id number] Date of Birth/Gender: [birth year] (86 y.o. M) Treating RN: Levan Hurst Primary Care Physician: [name] Other Clinician: Referring Physician: Treating Physician/Extender:Robson, Archie Patten, Docia Barrier in Treatment: 0 Education Assessment Education Provided To: Patient Education Topics Provided Safety: Methods: Explain/Verbal Responses: State content correctly Tissue Oxygenation: Methods: Explain/Verbal Responses: State content correctly Wound/Skin Impairment: Methods: Explain/Verbal Responses: State content correctly Electronic Signature(s) Signed: 04/21/2019 6:12:10 PM By: Levan Hurst RN, BSN Entered By: Levan Hurst on 04/21/2019 11:48:53 -------------------------------------------------------------------------------- Wound Assessment Details Patient Name: Date of Service: [facility], Quinntin L. 04/21/2019 10:30 AM Medical Record [id number] Patient Account Number: [id number] Date of Birth/Sex: Treating RN: [birth year] (86 y.o. [name] Primary Care Saanvi Hakala: [name] Other Clinician: Referring Torren Maffeo: Treating Rewa Weissberg/Extender:Robson, Archie Patten, CHARLES Weeks in Treatment: 0 Wound Status Wound Number: 2 Primary Arterial Insufficiency Ulcer Etiology: Wound Location: Right Toe Great Wound Open Wounding Event: Gradually Appeared Status: Date Acquired: 03/31/2019 Comorbid Cataracts, Asthma, Chronic Obstructive Weeks Of Treatment: 0 History: Pulmonary Disease (COPD), Congestive Heart Clustered Wound: No Failure, Coronary Artery Disease, Hypertension, Peripheral Arterial Disease, Osteoarthritis, Confinement Anxiety Wound Measurements Length: (cm) 3.3 % Reduct Width: (cm) 0.5 % Reduct Depth: (cm) 0.1 Epitheli Area: (cm) 1.296 Tunneli Volume: (cm) 0.13 Undermi Wound Description Classification: Full Thickness Without Exposed Support Structures Wound Flat and Intact Margin: Exudate Small Amount: Exudate Serosanguineous Type: Exudate red, brown Color: Wound Bed Granulation Amount: Small (1-33%) Granulation Quality: Red Necrotic Amount: Small (1-33%) Necrotic Quality: Adherent Slough Foul Odor After Cleansing: No Slough/Fibrino No Exposed Structure Fascia Exposed: No Fat Layer (Subcutaneous Tissue) Exposed: Yes Tendon Exposed: No Muscle Exposed: No Joint Exposed: No Bone Exposed: No ion in Area: ion in Volume: alization: Large (67-100%) ng: No ning: No Treatment Notes Wound #2 (Right Toe Great) 1. Cleanse With Wound Cleanser 3. Primary Dressing Applied Collegen AG Hydrogel or K-Y Jelly 4. Secondary Dressing Dry Gauze Roll Gauze Foam 5. Secured With Medipore tape 7. Footwear/Offloading device applied Surgical shoe Notes explained the orders, dressings, frequency of change, and when to return to wound center. patient and family member in agreement. Electronic Signature(s) Signed: 04/21/2019 5:40:42 PM By: Kela Millin Entered By: Kela Millin on 04/21/2019  11:24:48 -------------------------------------------------------------------------------- Vitals Details Patient Name: Date of Service: [facility], Jaki L. 04/21/2019 10:30 AM Medical Record [id number] Patient Account Number: [id number] Date of Birth/Sex: Treating RN: [birth year] (86 y.o. [name] Primary Care Aalayah Riles: [name] Other Clinician: Referring Rayyan Orsborn: Treating Thirza Pellicano/Extender:Robson, Archie Patten, CHARLES Weeks in Treatment: 0 Vital Signs Time Taken: 10:50 Temperature (F): 98 Height (in): 69 Pulse (bpm): 96 Source: Stated Respiratory Rate (breaths/min): 20 Weight (lbs): 132 Blood Pressure (mmHg): 154/75 Source: Stated Reference Range: 80 - 120 mg / dl Body Mass Index (BMI): 19.5 Electronic Signature(s) Signed: 04/21/2019 5:40:42 PM By: Kela Millin Entered By: Kela Millin on 04/21/2019 10:53:05

## 2019-04-23 ENCOUNTER — Other Ambulatory Visit: Payer: Self-pay

## 2019-04-23 ENCOUNTER — Ambulatory Visit (INDEPENDENT_AMBULATORY_CARE_PROVIDER_SITE_OTHER): Payer: Medicare Other | Admitting: Interventional Cardiology

## 2019-04-23 ENCOUNTER — Ambulatory Visit: Payer: Medicare Other | Admitting: Vascular Surgery

## 2019-04-23 ENCOUNTER — Ambulatory Visit (HOSPITAL_COMMUNITY): Payer: Medicare Other

## 2019-04-23 ENCOUNTER — Encounter: Payer: Self-pay | Admitting: Interventional Cardiology

## 2019-04-23 VITALS — BP 152/74 | HR 71 | Ht 69.0 in | Wt 137.8 lb

## 2019-04-23 DIAGNOSIS — Z7189 Other specified counseling: Secondary | ICD-10-CM

## 2019-04-23 DIAGNOSIS — I739 Peripheral vascular disease, unspecified: Secondary | ICD-10-CM | POA: Diagnosis not present

## 2019-04-23 DIAGNOSIS — I10 Essential (primary) hypertension: Secondary | ICD-10-CM | POA: Diagnosis not present

## 2019-04-23 DIAGNOSIS — E782 Mixed hyperlipidemia: Secondary | ICD-10-CM

## 2019-04-23 DIAGNOSIS — I779 Disorder of arteries and arterioles, unspecified: Secondary | ICD-10-CM

## 2019-04-23 DIAGNOSIS — I5023 Acute on chronic systolic (congestive) heart failure: Secondary | ICD-10-CM | POA: Diagnosis not present

## 2019-04-23 DIAGNOSIS — I25810 Atherosclerosis of coronary artery bypass graft(s) without angina pectoris: Secondary | ICD-10-CM | POA: Diagnosis not present

## 2019-04-23 DIAGNOSIS — I2781 Cor pulmonale (chronic): Secondary | ICD-10-CM

## 2019-04-23 NOTE — Patient Instructions (Addendum)

## 2019-04-23 NOTE — Telephone Encounter (Signed)
Dr Scot Dock contacted patients son on 04/17/19 and answered all questions.

## 2019-04-24 ENCOUNTER — Encounter (HOSPITAL_BASED_OUTPATIENT_CLINIC_OR_DEPARTMENT_OTHER): Payer: Medicare Other | Admitting: Internal Medicine

## 2019-04-24 ENCOUNTER — Ambulatory Visit (HOSPITAL_BASED_OUTPATIENT_CLINIC_OR_DEPARTMENT_OTHER): Payer: Medicare Other | Admitting: Internal Medicine

## 2019-04-29 ENCOUNTER — Encounter (HOSPITAL_BASED_OUTPATIENT_CLINIC_OR_DEPARTMENT_OTHER): Payer: Medicare Other | Admitting: Internal Medicine

## 2019-04-29 ENCOUNTER — Other Ambulatory Visit: Payer: Self-pay

## 2019-04-29 DIAGNOSIS — J449 Chronic obstructive pulmonary disease, unspecified: Secondary | ICD-10-CM | POA: Diagnosis not present

## 2019-04-29 DIAGNOSIS — L97511 Non-pressure chronic ulcer of other part of right foot limited to breakdown of skin: Secondary | ICD-10-CM | POA: Diagnosis not present

## 2019-04-29 DIAGNOSIS — I110 Hypertensive heart disease with heart failure: Secondary | ICD-10-CM | POA: Diagnosis not present

## 2019-04-29 DIAGNOSIS — I87323 Chronic venous hypertension (idiopathic) with inflammation of bilateral lower extremity: Secondary | ICD-10-CM | POA: Diagnosis not present

## 2019-04-29 DIAGNOSIS — I509 Heart failure, unspecified: Secondary | ICD-10-CM | POA: Diagnosis not present

## 2019-04-29 DIAGNOSIS — I70235 Atherosclerosis of native arteries of right leg with ulceration of other part of foot: Secondary | ICD-10-CM | POA: Diagnosis not present

## 2019-04-29 DIAGNOSIS — Z87891 Personal history of nicotine dependence: Secondary | ICD-10-CM | POA: Diagnosis not present

## 2019-04-29 NOTE — Progress Notes (Signed)
Worthley, [name] ([id number]) Visit Report for 04/29/2019 HPI Details Patient Name: Date of Service: [name], [name] 04/29/2019 1:15 PM Medical Record [id number] Patient Account Number: [id number] Date of Birth/Sex: Treating RN: [birth year] (86 y.o. [name]) [name] Primary Care Provider: [name] Other Clinician: Referring Provider: Treating Provider/Extender:Yoltzin Barg, Archie Patten, Docia Barrier in Treatment: 1 History of Present Illness HPI Description: 01/30/18 on evaluation today patient presents for initial inspection concerning the skin tear of the right forearm. Fortunately there does not appear to be any evidence of infection and this occurred approximately three days ago. He has been using Vaseline over the region. With that being said the patient does have a history of cataracts COPD, hypertension, peripheral arterial disease, and osteoarthritis. He tells me that he does have a little bit of pain at the site but nothing too significant at this time which is good news. Fortunately this overall appears to be doing fairly well which is good news. No fevers, chills, nausea, or vomiting noted at this time. The biggest issue at this point is that the wound is of quite significant size. READMISSION 04/21/2019 This is a 86 year old man with multiple medical problems who was seen once in the clinic here in January 2020 with a skin tear on his right forearm seen by [name]. This problem this time started sometime recently. He was noted by podiatry to have erosions and wounds of his right foot on toes 1-3. He also saw Dr. [name] of dermatology who gave him Silvadene cream for what I think was felt to be a stasis dermatitis of his bilateral lower legs. At some point he was referred to [name]. He was also noted to have previous noninvasive arterial studies that showed no flow to either one of his toes and noncompressible ABIs bilaterally. [name] did noninvasive arterial studies on  him. These showed that he had greater than 75% stenosis in the proximal and mid common femoral artery. Monophasic flow in the posterior tibial and dorsalis pedis positions on the right foot. ABIs again were noncompressible. He was felt to have total occlusion of the superficial femoral artery on the right. The overall interpretation is that he had severe multilevel arterial occlusive disease. Absent femoral pulses and superficial femoral artery occlusions. In spite of this he was not felt to be candidate for arteriography given his bilateral femoral artery occlusions as well he would not be a candidate for endovascular approach secondary to his common femoral artery occlusions and proximal iliac disease. He was not felt to be a candidate for an open infra inguinal bypass. He therefore felt he would need to be monitored over time with the only option being a right below-knee amputation The patient complains currently of pain at night when he is up in bed. The pain in the right foot is better when he puts the leg down over the bed side. This is compatible with rest pain. He has very limited activity i.e. is even exhausted getting dressed in the morning because of cardiopulmonary issues therefore it is difficult to gauge claudication I believe he was referred here by Dr. [name] with one of the major issues is whether he would be a candidate for hyperbaric oxygen. He is not a diabetic. Dr. [name] did give him Silvadene cream for dry flaking skin in his bilateral lower extremities, however the patient is concerned about using this in the face of sulfa allergy. Podiatry had previously given him in a steroid cream I do not believe these use  this either Past medical history is extensive including basal cell and squamous cell skin cancers, combined congestive heart failure, O2 dependent COPD, cor pulmonale, venous stasis dermatitis, peripheral arterial disease, hearing loss, 4/20; the patient still has a  wound on the right first toe. This is on the medial aspect extended there is a second area on the plantar aspect. Most of what the patient talks about is claudication with minimal activity [however the patient is limited by his COPD], or even at rest at night. He is not getting a lot of sleep. We use silver collagen on this wound Electronic Signature(s) Signed: 04/29/2019 5:47:38 PM By: [name] MD Entered By: [name] on 04/29/2019 14:27:39 -------------------------------------------------------------------------------- Physical Exam Details Patient Name: Date of Service: [name] 04/29/2019 1:15 PM Medical Record [id number] Patient Account Number: [id number] Date of Birth/Sex: Treating RN: 1929-06-20 (86 y.o. [name] Primary Care Provider: [name] Other Clinician: Referring Provider: Treating Provider/Extender:Keanon Bevins, Archie Patten, CHARLES Weeks in Treatment: 1 Constitutional Patient is hypertensive.. Pulse regular and within target range for patient.Marland Kitchen Respirations regular, non-labored and within target range.. Temperature is normal and within the target range for the patient.. No change in weight.. Appears in no distress. Respiratory O2 on no distress. Cardiovascular It will pulses completely absent on the right. Integumentary (Hair, Skin) Dry flaking erythematous skin in the right foot.. Notes Wound exam; the areas on the medial right great toe. Superficial wound. Has a smaller area and more distally. These look ischemic. No mechanical debridement. Electronic Signature(s) Signed: 04/29/2019 5:47:38 PM By: [name] MD Entered By: [name] on 04/29/2019 14:29:00 -------------------------------------------------------------------------------- Physician Orders Details Patient Name: Date of Service: [name], [name] 04/29/2019 1:15 PM Medical Record [id number] Patient Account Number: [id number] Date of Birth/Sex: Treating  RN: 12-Jun-1929 (86 y.o. [name]) [name] Primary Care Provider: [name] Other Clinician: Referring Provider: Treating Provider/Extender:Lilias Lorensen, Archie Patten, Docia Barrier in Treatment: 1 Verbal / Phone Orders: No Diagnosis Coding ICD-10 Coding Code Description I70.235 Atherosclerosis of native arteries of right leg with ulceration of other part of foot L97.511 Non-pressure chronic ulcer of other part of right foot limited to breakdown of skin I87.323 Chronic venous hypertension (idiopathic) with inflammation of bilateral lower extremity Follow-up Appointments Return Appointment in 2 weeks. Dressing Change Frequency Wound #2 Right Toe Great Change Dressing every other day. Wound #3 Right,Medial Foot Change Dressing every other day. Skin Barriers/Peri-Wound Care Moisturizing lotion - to both legs and feet daily TCA Cream or Ointment Wound Cleansing May shower and wash wound with soap and water. - on days that dressing is changed Primary Wound Dressing Wound #2 Right Toe Great Silver Collagen - moisten with hydrogel or KY jelly Wound #3 Right,Medial Foot Silver Collagen - moisten with hydrogel or KY jelly Secondary Dressing Wound #2 Right Toe Great Foam Kerlix/Rolled Gauze Dry Gauze Wound #3 Right,Medial Foot Foam Kerlix/Rolled Gauze Dry Gauze Off-Loading Open toe surgical shoe to: - right foot Patient Medications Allergies: cefdinir, Sulfa (Sulfonamide Antibiotics), nitrofurantoin, ciprofloxacin, sertraline, Levaquin, doxycycline, amoxicillin Notifications Medication Indication Start End triamcinolone acetonide 04/29/2019 DOSE topical 0.1 % cream - cream topical 1:4 in cetphil cream. Apply to affected area lightly daily Electronic Signature(s) Signed: 04/29/2019 2:31:27 PM By: [name] MD Entered By: [name] on 04/29/2019 14:31:25 -------------------------------------------------------------------------------- Problem List Details Patient  Name: Date of Service: [name] 04/29/2019 1:15 PM Medical Record [id number] Patient Account Number: [id number] Date of Birth/Sex: Treating RN: October 02, 1929 (86 y.o. [name]) [name] Primary Care Provider: [name] Other Clinician:  Referring Provider: Treating Provider/Extender:Kenidy Crossland, Archie Patten, CHARLES Weeks in Treatment: 1 Active Problems ICD-10 Evaluated Encounter Code Description Active Date Today Diagnosis I70.235 Atherosclerosis of native arteries of right leg with 04/21/2019 No Yes ulceration of other part of foot L97.511 Non-pressure chronic ulcer of other part of right foot 04/21/2019 No Yes limited to breakdown of skin I87.323 Chronic venous hypertension (idiopathic) with 04/21/2019 No Yes inflammation of bilateral lower extremity Inactive Problems Resolved Problems Electronic Signature(s) Signed: 04/29/2019 5:47:38 PM By: [name] MD Entered By: [name] on 04/29/2019 14:24:59 -------------------------------------------------------------------------------- Progress Note Details Patient Name: Date of Service: [name] 04/29/2019 1:15 PM Medical Record [id number] Patient Account Number: [id number] Date of Birth/Sex: Treating RN: 02/05/1929 (86 y.o. [name] Primary Care Provider: [name] Other Clinician: Referring Provider: Treating Provider/Extender:Lavora Brisbon, Archie Patten, CHARLES Weeks in Treatment: 1 Subjective History of Present Illness (HPI) 01/30/18 on evaluation today patient presents for initial inspection concerning the skin tear of the right forearm. Fortunately there does not appear to be any evidence of infection and this occurred approximately three days ago. He has been using Vaseline over the region. With that being said the patient does have a history of cataracts COPD, hypertension, peripheral arterial disease, and osteoarthritis. He tells me that he does have a little bit of pain at the site but  nothing too significant at this time which is good news. Fortunately this overall appears to be doing fairly well which is good news. No fevers, chills, nausea, or vomiting noted at this time. The biggest issue at this point is that the wound is of quite significant size. READMISSION 04/21/2019 This is a 86 year old man with multiple medical problems who was seen once in the clinic here in January 2020 with a skin tear on his right forearm seen by [name]. This problem this time started sometime recently. He was noted by podiatry to have erosions and wounds of his right foot on toes 1-3. He also saw Dr. [name] of dermatology who gave him Silvadene cream for what I think was felt to be a stasis dermatitis of his bilateral lower legs. At some point he was referred to [name]. He was also noted to have previous noninvasive arterial studies that showed no flow to either one of his toes and noncompressible ABIs bilaterally. [name] did noninvasive arterial studies on him. These showed that he had greater than 75% stenosis in the proximal and mid common femoral artery. Monophasic flow in the posterior tibial and dorsalis pedis positions on the right foot. ABIs again were noncompressible. He was felt to have total occlusion of the superficial femoral artery on the right. The overall interpretation is that he had severe multilevel arterial occlusive disease. Absent femoral pulses and superficial femoral artery occlusions. In spite of this he was not felt to be candidate for arteriography given his bilateral femoral artery occlusions as well he would not be a candidate for endovascular approach secondary to his common femoral artery occlusions and proximal iliac disease. He was not felt to be a candidate for an open infra inguinal bypass. He therefore felt he would need to be monitored over time with the only option being a right below-knee amputation The patient complains currently of pain at night  when he is up in bed. The pain in the right foot is better when he puts the leg down over the bed side. This is compatible with rest pain. He has very limited activity i.e. is even exhausted getting dressed in  the morning because of cardiopulmonary issues therefore it is difficult to gauge claudication I believe he was referred here by Dr. [name] with one of the major issues is whether he would be a candidate for hyperbaric oxygen. He is not a diabetic. Dr. [name] did give him Silvadene cream for dry flaking skin in his bilateral lower extremities, however the patient is concerned about using this in the face of sulfa allergy. 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 This is on the medial aspect extended there is a second area on the plantar aspect. Most of what the patient talks about is claudication with minimal activity [however the patient is limited by his COPD], or even at rest at night. He is not getting a lot of sleep. We use silver collagen on this wound Objective Constitutional Patient is hypertensive.. Pulse regular and within target range for patient.Marland Kitchen Respirations regular, non-labored and within target range.. Temperature is normal and within the target range for the patient.. No change in weight.. Appears in no distress. Vitals Time Taken: 1:34 PM, Height: 69 in, Weight: 132 lbs, BMI: 19.5, Temperature: 98.1 F, Pulse: 98 bpm, Respiratory Rate: 20 breaths/min, Blood Pressure: 183/95 mmHg. Respiratory O2 on no distress. Cardiovascular It will pulses completely absent on the right. General Notes: Wound exam; the areas on the medial right great toe.  Superficial wound. Has a smaller area and more distally. These look ischemic. No mechanical debridement. Integumentary (Hair, Skin) Dry flaking erythematous skin in the right foot.. Wound #2 status is Open. Original cause of wound was Gradually Appeared. The wound is located on the Right Toe Great. The wound measures 4cm length x 0.8cm width x 0.1cm depth; 2.513cm^2 area and 0.251cm^3 volume. There is Fat Layer (Subcutaneous Tissue) Exposed exposed. There is no tunneling or undermining noted. There is a medium amount of serosanguineous drainage noted. The wound margin is flat and intact. There is no granulation within the wound bed. There is a large (67-100%) amount of necrotic tissue within the wound bed including Adherent Slough. Wound #3 status is Open. Original cause of wound was Gradually Appeared. The wound is located on the Right,Medial Foot. The wound measures 0.3cm length x 0.4cm width x 0.1cm depth; 0.094cm^2 area and 0.009cm^3 volume. There is Fat Layer (Subcutaneous Tissue) Exposed exposed. There is no tunneling or undermining noted. There is a medium amount of serosanguineous drainage noted. The wound margin is flat and intact. There is large (67-100%) pink granulation within the wound bed. There is no necrotic tissue within the wound bed. Assessment Active Problems ICD-10 Atherosclerosis of native arteries of right leg with ulceration of other part of foot Non-pressure chronic ulcer of other part of right foot limited to breakdown of skin Chronic venous hypertension (idiopathic) with inflammation of bilateral lower extremity Plan Follow-up Appointments: Return Appointment in 2 weeks. Dressing Change Frequency: Wound #2 Right Toe Great: Change Dressing every other day. Wound #3 Right,Medial Foot: Change Dressing every other day. Skin Barriers/Peri-Wound Care: Moisturizing lotion - to both legs and feet daily TCA Cream or Ointment Wound Cleansing: May shower and wash  wound with soap and water. - on days that dressing is changed Primary Wound Dressing: Wound #2 Right Toe  Great: Silver Collagen - moisten with hydrogel or KY jelly Wound #3 Right,Medial Foot: Silver Collagen - moisten with hydrogel or KY jelly Secondary Dressing: Wound #2 Right Toe Great: Foam Kerlix/Rolled Gauze Dry Gauze Wound #3 Right,Medial Foot: Foam Kerlix/Rolled Gauze Dry Gauze Off-Loading: Open toe surgical shoe to: - right foot The following medication(s) was prescribed: triamcinolone acetonide topical 0.1 % cream cream topical 1:4 in cetphil cream. Apply to affected area lightly daily starting 04/29/2019 1. I am continuing with the silver collagen 2. Optimistically the area on his right first toe is stable. There is no opportunity for revascularization. He has claudication and rest at night and with minimal activity during the day. 3. I am not sure he is prepared for what is coming however I did not talk to him about amputation today 4. He has had dry flaking dermatitis in his foot and lower leg I have given him a prescription for triamcinolone 0.1% 1/4 lightly daily Electronic Signature(s) Signed: 04/29/2019 5:47:38 PM By: [name] MD Entered By: [name] on 04/29/2019 14:32:26 -------------------------------------------------------------------------------- SuperBill Details Patient Name: Date of Service: [name] 04/29/2019 Medical Record [id number] Patient Account Number: [id number] Date of Birth/Sex: Treating RN: 1929-01-11 (86 y.o. [name]) Dolores Lory, Morey Hummingbird Primary Care Provider: [name] Other Clinician: Referring Provider: Treating Provider/Extender:Avalon Coppinger, Archie Patten, CHARLES Weeks in Treatment: 1 Diagnosis Coding ICD-10 Codes Code Description I70.235 Atherosclerosis of native arteries of right leg with ulceration of other part of foot L97.511 Non-pressure chronic ulcer of other part of right foot limited to breakdown of skin I87.323  Chronic venous hypertension (idiopathic) with inflammation of bilateral lower extremity Facility Procedures Physician Procedures CPT4 Code Description: [id number] - WC PHYS LEVEL 3 - EST PT ICD-10 Diagnosis Description I70.235 Atherosclerosis of native arteries of right leg with ulce L97.511 Non-pressure chronic ulcer of other part of right foot li I87.323 Chronic venous  hypertension (idiopathic) with inflammatio extremity Modifier: ration of other p mited to breakdow n of bilateral lo Quantity: 1 art of foot n of skin wer Electronic Signature(s) Signed: 04/29/2019 5:47:38 PM By: [name] MD Entered By: [name] on 04/29/2019 14:32:47

## 2019-05-05 ENCOUNTER — Encounter (HOSPITAL_BASED_OUTPATIENT_CLINIC_OR_DEPARTMENT_OTHER): Payer: Medicare Other | Admitting: Internal Medicine

## 2019-05-07 ENCOUNTER — Ambulatory Visit (INDEPENDENT_AMBULATORY_CARE_PROVIDER_SITE_OTHER): Payer: Medicare Other | Admitting: Dermatology

## 2019-05-07 ENCOUNTER — Encounter: Payer: Self-pay | Admitting: Dermatology

## 2019-05-07 ENCOUNTER — Other Ambulatory Visit: Payer: Self-pay

## 2019-05-07 DIAGNOSIS — I779 Disorder of arteries and arterioles, unspecified: Secondary | ICD-10-CM | POA: Diagnosis not present

## 2019-05-07 DIAGNOSIS — Z1283 Encounter for screening for malignant neoplasm of skin: Secondary | ICD-10-CM

## 2019-05-07 DIAGNOSIS — L570 Actinic keratosis: Secondary | ICD-10-CM

## 2019-05-07 DIAGNOSIS — L821 Other seborrheic keratosis: Secondary | ICD-10-CM | POA: Diagnosis not present

## 2019-05-07 DIAGNOSIS — D099 Carcinoma in situ, unspecified: Secondary | ICD-10-CM

## 2019-05-07 DIAGNOSIS — D045 Carcinoma in situ of skin of trunk: Secondary | ICD-10-CM

## 2019-05-08 DIAGNOSIS — F324 Major depressive disorder, single episode, in partial remission: Secondary | ICD-10-CM | POA: Diagnosis not present

## 2019-05-08 DIAGNOSIS — E782 Mixed hyperlipidemia: Secondary | ICD-10-CM | POA: Diagnosis not present

## 2019-05-08 DIAGNOSIS — J449 Chronic obstructive pulmonary disease, unspecified: Secondary | ICD-10-CM | POA: Diagnosis not present

## 2019-05-08 DIAGNOSIS — N400 Enlarged prostate without lower urinary tract symptoms: Secondary | ICD-10-CM | POA: Diagnosis not present

## 2019-05-08 DIAGNOSIS — I509 Heart failure, unspecified: Secondary | ICD-10-CM | POA: Diagnosis not present

## 2019-05-08 DIAGNOSIS — I10 Essential (primary) hypertension: Secondary | ICD-10-CM | POA: Diagnosis not present

## 2019-05-11 ENCOUNTER — Encounter: Payer: Self-pay | Admitting: Dermatology

## 2019-05-11 NOTE — Progress Notes (Signed)
   Follow-Up Visit   Subjective  [name] is a 86 y.o. male who presents for the following: Annual Exam (right flank-treated several times, ear - cant remember which ear-- right shoulder- new). Wife present throughout visit Son back Location:  Duration: Several months Quality: Larger Associated Signs/Symptoms: Modifying Factors:  Severity:  Timing: Context: Patient does not desire any procedure unless absolutely necessary.  Severe cardiovascular disease.  The following portions of the chart were reviewed this encounter and updated as appropriate:     Objective  Well appearing patient in no apparent distress; mood and affect are within normal limits.  All sun exposed areas plus back examined.   Assessment & Plan   watch.

## 2019-05-12 ENCOUNTER — Ambulatory Visit: Payer: Medicare Other | Admitting: Internal Medicine

## 2019-05-13 ENCOUNTER — Encounter (HOSPITAL_BASED_OUTPATIENT_CLINIC_OR_DEPARTMENT_OTHER): Payer: Medicare Other | Attending: Internal Medicine | Admitting: Internal Medicine

## 2019-05-13 DIAGNOSIS — L97512 Non-pressure chronic ulcer of other part of right foot with fat layer exposed: Secondary | ICD-10-CM | POA: Diagnosis not present

## 2019-05-13 DIAGNOSIS — I110 Hypertensive heart disease with heart failure: Secondary | ICD-10-CM | POA: Insufficient documentation

## 2019-05-13 DIAGNOSIS — I87323 Chronic venous hypertension (idiopathic) with inflammation of bilateral lower extremity: Secondary | ICD-10-CM | POA: Diagnosis not present

## 2019-05-13 DIAGNOSIS — I70235 Atherosclerosis of native arteries of right leg with ulceration of other part of foot: Secondary | ICD-10-CM | POA: Insufficient documentation

## 2019-05-13 DIAGNOSIS — I5042 Chronic combined systolic (congestive) and diastolic (congestive) heart failure: Secondary | ICD-10-CM | POA: Diagnosis not present

## 2019-05-13 DIAGNOSIS — M1990 Unspecified osteoarthritis, unspecified site: Secondary | ICD-10-CM | POA: Insufficient documentation

## 2019-05-13 DIAGNOSIS — L97511 Non-pressure chronic ulcer of other part of right foot limited to breakdown of skin: Secondary | ICD-10-CM | POA: Diagnosis not present

## 2019-05-13 DIAGNOSIS — J449 Chronic obstructive pulmonary disease, unspecified: Secondary | ICD-10-CM | POA: Diagnosis not present

## 2019-05-14 ENCOUNTER — Other Ambulatory Visit: Payer: Self-pay | Admitting: *Deleted

## 2019-05-14 ENCOUNTER — Ambulatory Visit: Payer: Medicare Other | Admitting: Podiatry

## 2019-05-14 MED ORDER — SPIRIVA RESPIMAT 2.5 MCG/ACT IN AERS: INHALATION_SPRAY | RESPIRATORY_TRACT | 11 refills | Status: DC

## 2019-05-14 NOTE — Progress Notes (Signed)
Stager, Kalmen [name] ([id number]) Visit Report for 05/13/2019 Debridement Details Patient Name: Date of Service: DO RN, [name] 05/13/2019 2:00 PM Medical Record Number: [id number] Patient Account Number: [id number] Date of Birth/Sex: Treating RN: 1929/01/19 (86 y.o. [name]) [name] Primary Care Provider: Piedad Climes, CHA RLES Other Clinician: Referring Provider: Treating Provider/Extender: Mare Ferrari, CHA RLES Weeks in Treatment: 3 Debridement Performed for Assessment: Wound #2 Right T Great oe Performed By: Physician Ricard Dillon., MD Debridement Type: Debridement Severity of Tissue Pre Debridement: Fat layer exposed Level of Consciousness (Pre-procedure): Awake and Alert Pre-procedure Verification/Time Out Yes - 15:07 Taken: Start Time: 15:07 Pain Control: Lidocaine 5% topical ointment T Area Debrided (L x W): otal 0.8 (cm) x 1 (cm) = 0.8 (cm) Tissue and other material debrided: Viable, Non-Viable, Slough, Skin: Dermis , Skin: Epidermis, Slough Level: Skin/Epidermis Debridement Description: Selective/Open Wound Instrument: Curette Bleeding: Moderate Hemostasis Achieved: Pressure End Time: 15:09 Procedural Pain: 3 Post Procedural Pain: 0 Response to Treatment: Procedure was tolerated well Level of Consciousness (Post- Awake and Alert procedure): Post Debridement Measurements of Total Wound Length: (cm) 0.8 Width: (cm) 1 Depth: (cm) 0.1 Volume: (cm) 0.063 Character of Wound/Ulcer Post Debridement: Improved Severity of Tissue Post Debridement: Fat layer exposed Post Procedure Diagnosis Same as Pre-procedure Electronic Signature(s) Signed: 05/13/2019 5:25:34 PM By: [name] MD Signed: 05/14/2019 4:37:47 PM By: [name] RN Entered By: [name] on 05/13/2019 15:46:02 -------------------------------------------------------------------------------- Debridement Details Patient Name: Date of Service: DO RN, [name] 05/13/2019 2:00 PM Medical Record  Number: [id number] Patient Account Number: [id number] Date of Birth/Sex: Treating RN: 1929-11-08 (86 y.o. [name]) [name] Primary Care Provider: Piedad Climes, CHA RLES Other Clinician: Referring Provider: Treating Provider/Extender: Mare Ferrari, CHA RLES Weeks in Treatment: 3 Debridement Performed for Assessment: Wound #4 Right,Medial T Great oe Performed By: Physician Ricard Dillon., MD Debridement Type: Debridement Severity of Tissue Pre Debridement: Fat layer exposed Level of Consciousness (Pre-procedure): Awake and Alert Pre-procedure Verification/Time Out Yes - 15:07 Taken: Start Time: 15:07 Pain Control: Lidocaine 5% topical ointment T Area Debrided (L x W): otal 1.4 (cm) x 1.4 (cm) = 1.96 (cm) Tissue and other material debrided: Viable, Non-Viable, Slough, Skin: Dermis , Skin: Epidermis, Slough Level: Skin/Epidermis Debridement Description: Selective/Open Wound Instrument: Curette Bleeding: Moderate Hemostasis Achieved: Pressure End Time: 15:09 Procedural Pain: 3 Post Procedural Pain: 0 Response to Treatment: Procedure was tolerated well Level of Consciousness (Post- Awake and Alert procedure): Post Debridement Measurements of Total Wound Length: (cm) 1.4 Width: (cm) 1.4 Depth: (cm) 0.1 Volume: (cm) 0.154 Character of Wound/Ulcer Post Debridement: Improved Severity of Tissue Post Debridement: Fat layer exposed Post Procedure Diagnosis Same as Pre-procedure Electronic Signature(s) Signed: 05/13/2019 5:25:34 PM By: [name] MD Signed: 05/14/2019 4:37:47 PM By: [name] RN Entered By: [name] on 05/13/2019 15:46:10 -------------------------------------------------------------------------------- HPI Details Patient Name: Date of Service: DO RN, [name] 05/13/2019 2:00 PM Medical Record Number: [id number] Patient Account Number: [id number] Date of Birth/Sex: Treating RN: Oct 24, 1929 (86 y.o. [name]) [name] Primary Care Provider: Piedad Climes, CHA  RLES Other Clinician: Referring Provider: Treating Provider/Extender: Mare Ferrari, CHA RLES Weeks in Treatment: 3 History of Present Illness HPI Description: 01/30/18 on evaluation today patient presents for initial inspection concerning the skin tear of the right forearm. Fortunately there does not appear to be any evidence of infection and this occurred approximately three days ago. He has been using Vaseline over the region. With that being  said the patient does have a history of cataracts COPD, hypertension, peripheral arterial disease, and osteoarthritis. He tells me that he does have a little bit of pain at the site but nothing too significant at this time which is good news. Fortunately this overall appears to be doing fairly well which is good news. No fevers, chills, nausea, or vomiting noted at this time. The biggest issue at this point is that the wound is of quite significant size. READMISSION 04/21/2019 This is a 86 year old man with multiple medical problems who was seen once in the clinic here in January 2020 with a skin tear on his right forearm seen by [name]. This problem this time started sometime recently. He was noted by podiatry to have erosions and wounds of his right foot on toes 1-3. He also saw [name] of dermatology who gave him Silvadene cream for what I think was felt to be a stasis dermatitis of his bilateral lower legs. At some point he was referred to [name]. He was also noted to have previous noninvasive arterial studies that showed no flow to either one of his toes and noncompressible ABIs bilaterally. [name] did noninvasive arterial studies on him. These showed that he had greater than 75% stenosis in the proximal and mid common femoral artery. Monophasic flow in the posterior tibial and dorsalis pedis positions on the right foot. ABIs again were noncompressible. He was felt to have total occlusion of the superficial femoral artery on the  right. The overall interpretation is that he had severe multilevel arterial occlusive disease. Absent femoral pulses and superficial femoral artery occlusions. In spite of this he was not felt to be candidate for arteriography given his bilateral femoral artery occlusions as well he would not be a candidate for endovascular approach secondary to his common femoral artery occlusions and proximal iliac disease. He was not felt to be a candidate for an open infra inguinal bypass. He therefore felt he would need to be monitored over time with the only option being a right below-knee amputation The patient complains currently of pain at night when he is up in bed. The pain in the right foot is better when he puts the leg down over the bed side. This is compatible with rest pain. He has very limited activity i.e. is even exhausted getting dressed in the morning because of cardiopulmonary issues therefore it is difficult to gauge claudication I believe he was referred here by Dr. [name] with one of the major issues is whether he would be a candidate for hyperbaric oxygen. He is not a diabetic. Dr. [name] did give him Silvadene cream for dry flaking skin in his bilateral lower extremities, however the patient is concerned about using this in the face of sulfa allergy. 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 This is on the medial aspect extended there is a second area on the plantar aspect. Most of what the patient talks about is claudication with minimal activity [however the patient is limited by his COPD], or even at rest at night. He is not getting a lot of sleep. We use  silver collagen on this wound 5/4; the patient has a wound  on the tip of his right great toe also on the medial aspect. Setting of severe nonrevascularizable PAD [see description in HPI]. He does not describe any change in his pain. We have been using silver collagen Electronic Signature(s) Signed: 05/13/2019 5:25:34 PM By: [name] MD Entered By: [name] on 05/13/2019 15:47:57 -------------------------------------------------------------------------------- Physical Exam Details Patient Name: Date of Service: DO RN, [name] 05/13/2019 2:00 PM Medical Record Number: [id number] Patient Account Number: [id number] Date of Birth/Sex: Treating RN: 18-Oct-1929 (86 y.o. [name] Primary Care Provider: Piedad Climes, CHA RLES Other Clinician: Referring Provider: Treating Provider/Extender: Mare Ferrari, CHA RLES Weeks in Treatment: 3 Constitutional Patient is hypertensive.. Pulse regular and within target range for patient.Marland Kitchen Respirations regular, non-labored and within target range.. Temperature is normal and within the target range for the patient.Marland Kitchen Appears in no distress. Cardiovascular Fetal pulses are not palpable. Notes Wound exam; tip in the medial part of the right great toe. I used a #3 curette to see if I could tidy up both wound surfaces. Removing skin and subcutaneous tissue Electronic Signature(s) Signed: 05/13/2019 5:25:34 PM By: [name] MD Entered By: [name] on 05/13/2019 15:49:31 -------------------------------------------------------------------------------- Physician Orders Details Patient Name: Date of Service: DO RN, [name] 05/13/2019 2:00 PM Medical Record Number: [id number] Patient Account Number: [id number] Date of Birth/Sex: Treating RN: 12/11/1929 (86 y.o. [name]) [name] Primary Care Provider: Piedad Climes, CHA RLES Other Clinician: Referring Provider: Treating Provider/Extender: Mare Ferrari, CHA RLES Weeks in Treatment:  3 Verbal / Phone Orders: No Diagnosis Coding ICD-10 Coding Code Description I70.235 Atherosclerosis of native arteries of right leg with ulceration of other part of foot L97.511 Non-pressure chronic ulcer of other part of right foot limited to breakdown of skin I87.323 Chronic venous hypertension (idiopathic) with inflammation of bilateral lower extremity Follow-up Appointments Return Appointment in 2 weeks. Dressing Change Frequency Wound #2 Right T Great oe Change Dressing every other day. Skin Barriers/Peri-Wound Care Moisturizing lotion - to both legs and feet daily TCA Cream or Ointment Wound Cleansing May shower and wash wound with soap and water. - on days that dressing is changed Primary Wound Dressing Wound #2 Right T Great oe Silver Collagen - moisten with hydrogel or KY jelly Secondary Dressing Wound #2 Right T Great oe Foam Kerlix/Rolled Gauze Dry Gauze Off-Loading Open toe surgical shoe to: - right foot Electronic Signature(s) Signed: 05/13/2019 5:25:34 PM By: [name] MD Signed: 05/14/2019 4:37:47 PM By: [name] RN Entered By: [name] on 05/13/2019 15:02:17 -------------------------------------------------------------------------------- Problem List Details Patient Name: Date of Service: DO RN, [name] 05/13/2019 2:00 PM Medical Record Number: [id number] Patient Account Number: [id number] Date of Birth/Sex: Treating RN: March 04, 1929 (86 y.o. [name]) [name] Primary Care Provider: Piedad Climes, CHA RLES Other Clinician: Referring Provider: Treating Provider/Extender: Mare Ferrari, CHA RLES Weeks in Treatment: 3 Active Problems ICD-10 Encounter Code Description Active Date MDM Diagnosis I70.235 Atherosclerosis of native arteries of right leg with ulceration of other part of 04/21/2019 No Yes foot L97.511 Non-pressure chronic ulcer of other part of right foot limited to breakdown of 04/21/2019 No Yes skin I87.323 Chronic venous  hypertension (idiopathic) with inflammation of bilateral lower 04/21/2019 No Yes extremity Inactive Problems Resolved Problems Electronic Signature(s) Signed: 05/13/2019 5:25:34 PM By: [name] MD Entered By: [name] on 05/13/2019 15:45:43 -------------------------------------------------------------------------------- Progress Note Details Patient Name: Date of Service: DO RN, [name] 05/13/2019 2:00 PM Medical Record Number:  [id number] Patient Account Number: [id number] Date of Birth/Sex: Treating RN: [birth year] (86 y.o. [name]) [name] Primary Care Provider: Piedad Climes, CHA RLES Other Clinician: Referring Provider: Treating Provider/Extender: Mare Ferrari, CHA RLES Weeks in Treatment: 3 Subjective History of Present Illness (HPI) 01/30/18 on evaluation today patient presents for initial inspection concerning the skin tear of the right forearm. Fortunately there does not appear to be any evidence of infection and this occurred approximately three days ago. He has been using Vaseline over the region. With that being said the patient does have a history of cataracts COPD, hypertension, peripheral arterial disease, and osteoarthritis. He tells me that he does have a little bit of pain at the site but nothing too significant at this time which is good news. Fortunately this overall appears to be doing fairly well which is good news. No fevers, chills, nausea, or vomiting noted at this time. The biggest issue at this point is that the wound is of quite significant size. READMISSION 04/21/2019 This is a 86 year old man with multiple medical problems who was seen once in the clinic here in January 2020 with a skin tear on his right forearm seen by [name]. This problem this time started sometime recently. He was noted by podiatry to have erosions and wounds of his right foot on toes 1-3. He also saw [name] of dermatology who gave him Silvadene cream for what I think was  felt to be a stasis dermatitis of his bilateral lower legs. At some point he was referred to [name]. He was also noted to have previous noninvasive arterial studies that showed no flow to either one of his toes and noncompressible ABIs bilaterally. [name] did noninvasive arterial studies on him. These showed that he had greater than 75% stenosis in the proximal and mid common femoral artery. Monophasic flow in the posterior tibial and dorsalis pedis positions on the right foot. ABIs again were noncompressible. He was felt to have total occlusion of the superficial femoral artery on the right. The overall interpretation is that he had severe multilevel arterial occlusive disease. Absent femoral pulses and superficial femoral artery occlusions. In spite of this he was not felt to be candidate for arteriography given his bilateral femoral artery occlusions as well he would not be a candidate for endovascular approach secondary to his common femoral artery occlusions and proximal iliac disease. He was not felt to be a candidate for an open infra inguinal bypass. He therefore felt he would need to be monitored over time with the only option being a right below-knee amputation The patient complains currently of pain at night when he is up in bed. The pain in the right foot is better when he puts the leg down over the bed side. This is compatible with rest pain. He has very limited activity i.e. is even exhausted getting dressed in the morning because of cardiopulmonary issues therefore it is difficult to gauge claudication I believe he was referred here by Dr. [name] with one of the major issues is whether he would be a candidate for hyperbaric oxygen. He is not a diabetic. Dr. [name] did give him Silvadene cream for dry flaking skin in his bilateral lower extremities, however the patient is concerned about using this in the face of sulfa allergy. Podiatry had previously given him in a steroid cream I  do not believe these use this either Past medical history is extensive including basal cell and squamous cell skin cancers, combined  congestive heart failure, O2 dependent COPD, cor pulmonale, venous stasis dermatitis, peripheral arterial disease, hearing loss, 4/20; the patient still has a wound on the right first toe. This is on the medial aspect extended there is a second area on the plantar aspect. Most of what the patient talks about is claudication with minimal activity [however the patient is limited by his COPD], or even at rest at night. He is not getting a lot of sleep. We use silver collagen on this wound 5/4; the patient has a wound on the tip of his right great toe also on the medial aspect. Setting of severe nonrevascularizable PAD [see description in HPI]. He does not describe any change in his pain. We have been using silver collagen Objective Constitutional Patient is hypertensive.. Pulse regular and within target range for patient.Marland Kitchen Respirations regular, non-labored and within target range.. Temperature is normal and within the target range for the patient.Marland Kitchen Appears in no distress. Vitals Time Taken: 2:29 PM, Height: 69 in, Weight: 132 lbs, BMI: 19.5, Temperature: 98.1 F, Pulse: 94 bpm, Respiratory Rate: 20 breaths/min, Blood Pressure: 160/74 mmHg. Cardiovascular Fetal pulses are not palpable. General Notes: Wound exam; tip in the medial part of the right great toe. I used a #3 curette to see if I could tidy up both wound surfaces. Removing skin and subcutaneous tissue Integumentary (Hair, Skin) Wound #2 status is Open. Original cause of wound was Gradually Appeared. The wound is located on the Right T Great. The wound measures 0.8cm length x oe 1cm width x 0.1cm depth; 0.628cm^2 area and 0.063cm^3 volume. There is Fat Layer (Subcutaneous Tissue) Exposed exposed. There is no tunneling or undermining noted. There is a small amount of serosanguineous drainage noted. The wound  margin is flat and intact. There is no granulation within the wound bed. There is a large (67-100%) amount of necrotic tissue within the wound bed including Adherent Slough. Wound #3 status is Healed - Epithelialized. Original cause of wound was Gradually Appeared. The wound is located on the Right,Medial Foot. The wound measures 0cm length x 0cm width x 0cm depth; 0cm^2 area and 0cm^3 volume. There is no tunneling or undermining noted. There is a none present amount of drainage noted. The wound margin is flat and intact. There is no granulation within the wound bed. There is no necrotic tissue within the wound bed. Wound #4 status is Open. Original cause of wound was Gradually Appeared. The wound is located on the Right,Medial T Great. The wound measures 1.4cm oe length x 1.4cm width x 0.1cm depth; 1.539cm^2 area and 0.154cm^3 volume. There is Fat Layer (Subcutaneous Tissue) Exposed exposed. There is no tunneling or undermining noted. There is a small amount of serous drainage noted. The wound margin is flat and intact. There is no granulation within the wound bed. There is a large (67-100%) amount of necrotic tissue within the wound bed. Assessment Active Problems ICD-10 Atherosclerosis of native arteries of right leg with ulceration of other part of foot Non-pressure chronic ulcer of other part of right foot limited to breakdown of skin Chronic venous hypertension (idiopathic) with inflammation of bilateral lower extremity Procedures Wound #2 Pre-procedure diagnosis of Wound #2 is an Arterial Insufficiency Ulcer located on the Right T Great .Severity of Tissue Pre Debridement is: Fat layer oe exposed. There was a Selective/Open Wound Skin/Epidermis Debridement with a total area of 0.8 sq cm performed by Ricard Dillon., MD. With the following instrument(s): Curette to remove Viable and Non-Viable tissue/material. Material removed  includes Slough, Skin: Dermis, and Skin: Epidermis  after achieving pain control using Lidocaine 5% topical ointment. No specimens were taken. A time out was conducted at 15:07, prior to the start of the procedure. A Moderate amount of bleeding was controlled with Pressure. The procedure was tolerated well with a pain level of 3 throughout and a pain level of 0 following the procedure. Post Debridement Measurements: 0.8cm length x 1cm width x 0.1cm depth; 0.063cm^3 volume. Character of Wound/Ulcer Post Debridement is improved. Severity of Tissue Post Debridement is: Fat layer exposed. Post procedure Diagnosis Wound #2: Same as Pre-Procedure Wound #4 Pre-procedure diagnosis of Wound #4 is an Arterial Insufficiency Ulcer located on the Right,Medial T Great .Severity of Tissue Pre Debridement is: Fat layer oe exposed. There was a Selective/Open Wound Skin/Epidermis Debridement with a total area of 1.96 sq cm performed by Ricard Dillon., MD. With the following instrument(s): Curette to remove Viable and Non-Viable tissue/material. Material removed includes Slough, Skin: Dermis, and Skin: Epidermis after achieving pain control using Lidocaine 5% topical ointment. No specimens were taken. A time out was conducted at 15:07, prior to the start of the procedure. A Moderate amount of bleeding was controlled with Pressure. The procedure was tolerated well with a pain level of 3 throughout and a pain level of 0 following the procedure. Post Debridement Measurements: 1.4cm length x 1.4cm width x 0.1cm depth; 0.154cm^3 volume. Character of Wound/Ulcer Post Debridement is improved. Severity of Tissue Post Debridement is: Fat layer exposed. Post procedure Diagnosis Wound #4: Same as Pre-Procedure Plan Follow-up Appointments: Return Appointment in 2 weeks. Dressing Change Frequency: Wound #2 Right T Great: oe Change Dressing every other day. Skin Barriers/Peri-Wound Care: Moisturizing lotion - to both legs and feet daily TCA Cream or Ointment Wound  Cleansing: May shower and wash wound with soap and water. - on days that dressing is changed Primary Wound Dressing: Wound #2 Right T Great: oe Silver Collagen - moisten with hydrogel or KY jelly Secondary Dressing: Wound #2 Right T Great: oe Foam Kerlix/Rolled Gauze Dry Gauze Off-Loading: Open toe surgical shoe to: - right foot 1. Silver collagen to continue to both wound areas 2. 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 05/13/2019 Medical Record Number: [id number] Patient Account Number: [id number] Date of Birth/Sex: Treating RN: [birth year] (86 y.o. [name]) Dolores Lory, [place] Primary Care Provider: Piedad Climes, CHA RLES Other Clinician: Referring Provider: Treating Provider/Extender: Mare Ferrari, CHA RLES Weeks in Treatment: 3 Diagnosis Coding ICD-10 Codes Code Description I70.235 Atherosclerosis of native arteries of right leg with ulceration of other part of foot L97.511 Non-pressure chronic ulcer of other part of right foot limited to breakdown of skin I87.323 Chronic venous hypertension (idiopathic) with inflammation of bilateral lower extremity Facility Procedures CPT4 Code: [id number] Description: [phone] - DEBRIDE WOUND 1ST 20 SQ CM OR < ICD-10 Diagnosis Description L97.511 Non-pressure chronic ulcer of other part of right foot limited to breakdown of ski Modifier: n Quantity: 1 Physician Procedures : CPT4 Code Description Modifier [id number] - WC PHYS DEBR WO ANESTH 20 SQ CM ICD-10 Diagnosis Description L97.511 Non-pressure chronic ulcer of other part of right foot limited to breakdown of skin Quantity:  1  Electronic Signature(s) Signed: 05/13/2019 5:25:34 PM By: [name] MD Entered By: [name] on 05/13/2019 15:50:28

## 2019-05-15 ENCOUNTER — Other Ambulatory Visit: Payer: Self-pay

## 2019-05-15 ENCOUNTER — Telehealth: Payer: Self-pay | Admitting: Interventional Cardiology

## 2019-05-15 ENCOUNTER — Other Ambulatory Visit: Payer: Medicare Other | Admitting: *Deleted

## 2019-05-15 DIAGNOSIS — Z515 Encounter for palliative care: Secondary | ICD-10-CM

## 2019-05-15 NOTE — Telephone Encounter (Signed)
Patient states he has been experiencing swelling in his feet and legs. I was unable to obtain any more information due to call disconnecting. Please return call to discuss.

## 2019-05-15 NOTE — Telephone Encounter (Signed)
Pt states that the he has noticed the swelling in his legs and feet have worsened the last few weeks.  Stopped using compression stockings about a month ago.  States calves get very tight and causes pain at night which keeps him from sleeping.  Currently takes Furosemide 40mg  on MWF and 20mg  on all other days.  BPs are always low.  Advised pt to start wearing compression stockings again to see if this improves swelling and pain.  Advised I will also route to Dr. Tamala Julian to see if any further recommendations and would call back if so.

## 2019-05-15 NOTE — Telephone Encounter (Signed)
Furosemide 40 mg daily and measure BP daily. Lt [place] know if < 90 mmHg systolic.

## 2019-05-15 NOTE — Telephone Encounter (Signed)
° °  Pt c/o swelling: STAT is pt has developed SOB within 24 hours  1) How much weight have you gained and in what time span? 1 lb in a month  2) If swelling, where is the swelling located? Both legs and feet  3) Are you currently taking a fluid pill? Yes  4) Are you currently SOB? Yes, but not sure if its from heart or COPD  5) Do you have a log of your daily weights (if so, list)? No  6) Have you gained 3 pounds in a day or 5 pounds in a week? No  7) Have you traveled recently? No  Pt said leg pain is so sever it disturbed his rest at night.

## 2019-05-16 ENCOUNTER — Telehealth: Payer: Self-pay | Admitting: Interventional Cardiology

## 2019-05-16 ENCOUNTER — Encounter: Payer: Self-pay | Admitting: *Deleted

## 2019-05-16 NOTE — Telephone Encounter (Signed)
Left message to call back  

## 2019-05-16 NOTE — Telephone Encounter (Signed)
Spoke with pt and advised per Dr Tamala Julian pt should increase his Furosemide to 40mg  daily by mouth and take his B/P daily.  Please contact office if systolic B/P (top number) is less than 90.    Pt verbalizes understanding and agrees with current plan.

## 2019-05-16 NOTE — Telephone Encounter (Signed)
New Message  Patient called and wants a nurse to call her back

## 2019-05-16 NOTE — Telephone Encounter (Signed)
Attempted phone call to pt and left voicemail message to contact RN at [phone]. 

## 2019-05-16 NOTE — Telephone Encounter (Signed)
Patient returning Jennifer's call.

## 2019-05-16 NOTE — Telephone Encounter (Signed)
Pt has been advised, see encounter 05/16/2019.

## 2019-05-19 ENCOUNTER — Other Ambulatory Visit: Payer: Self-pay

## 2019-05-19 ENCOUNTER — Encounter: Payer: Self-pay | Admitting: Internal Medicine

## 2019-05-19 ENCOUNTER — Ambulatory Visit (INDEPENDENT_AMBULATORY_CARE_PROVIDER_SITE_OTHER): Payer: Medicare Other | Admitting: Internal Medicine

## 2019-05-19 DIAGNOSIS — I2781 Cor pulmonale (chronic): Secondary | ICD-10-CM | POA: Diagnosis not present

## 2019-05-19 DIAGNOSIS — J310 Chronic rhinitis: Secondary | ICD-10-CM | POA: Diagnosis not present

## 2019-05-19 DIAGNOSIS — J9611 Chronic respiratory failure with hypoxia: Secondary | ICD-10-CM

## 2019-05-19 DIAGNOSIS — J9612 Chronic respiratory failure with hypercapnia: Secondary | ICD-10-CM | POA: Diagnosis not present

## 2019-05-19 DIAGNOSIS — I779 Disorder of arteries and arterioles, unspecified: Secondary | ICD-10-CM

## 2019-05-19 DIAGNOSIS — J449 Chronic obstructive pulmonary disease, unspecified: Secondary | ICD-10-CM | POA: Diagnosis not present

## 2019-05-19 NOTE — Progress Notes (Signed)
Subjective:     Patient ID: [name], male    DOB: 05-19-1929   MRN: [id number]   Brief patient profile:  41   yowm MM quit smoking in 1984 with GOLD IV/ 02 dep and steroid dep copd place on NIV by Dr Larkin Ina with profound chronic FTT not improved on NIV    History of Present Illness  05/25/2017  f/u ov/[name] re:  [name] iv copd/ 02 dep  Chief Complaint  Patient presents with  . Follow-up    Increased SOB and  fatigue x last week, 2.5L pulsed, compliant with spiriva, symbicort, and xopenex.   Dyspnea:  At rest/ worse walking 10 ft  Cough: none Sleep: on trilogy ok SABA use:  Confused with saba hfa vs neb  rec Add aldactone 50 mg daily  Continue triegy at your volition Ok to adjust xanax (alprazolam) from one-half to one three times a day to calm your breathing down and relieve anxiety related to breathlessness       09/23/2018  f/u ov/[name] re:  GOLD IV copd/ chronic resp failure, noct NIV dep / pred at 10 mg daily  Chief Complaint  Patient presents with  . Follow-up    F/U per patient for increased SOB and fatigue for the past month. Increased post nasal drip.   Dyspnea:  MMRC4  = sob if tries to leave home or while getting dressed   Cough: no Sleeping: on trilogy flat position  SABA use: about twice a day hfa xoepenex / neb works better and rec by [facility] pulmonary to try perform/bud instead of symbicort  02: 2lpm 24/7  rec Ok to stop symbicort and replace with performist and budesonide when available  See calendar for specific medication instructions and bring it back for each and every office visit for every healthcare provider you see.  Without it,  you may not receive the best quality medical care that we feel you deserve.   02/11/2019  f/u ov/[name] re: COPD GOLD IV 02 dep/ pred at 10 mg daily floor, no better on 10 ceiling  Chief Complaint  Patient presents with  . Follow-up    Pt c/o increased SOB x 5 days. He also c/o sinus congestion. He has been noticing swelling in  both legs over the past month.    Dyspnea:  Across the room assoc with leg swelling worse since decreased aldactone per cards to 12.5 mg daily   Cough: none  Sleeping: on vent  SABA use: more  02: 2-3 lpm 24 / 7 adjusting according to how he feels rather than monitoring his pulse oximeter as recommended. rec [employer] 02 saturation is over 90% ,  No benefit to driving it higher Add pepcid 20 mg at bedtime Let cardiology know about your leg swelling - aldactone adjustments should be considered          05/19/2019  f/u ov/[name] re: GOLD IV/ chronic resp failure/ got both covid shots Chief Complaint  Patient presents with  . Follow-up    3 month f/u for COPD. States his sinuses have been bothering him for the past 3 weeks. Was given a zpak 3 weeks, it did not help.   Dyspnea: across room limited by R foot now in boot  Cough: none  Sleeping: difficult to do due to leg pain  SABA use: still using hfa xopenex / neb q 3-4 days 02: 2lpm not adjustng    No obvious day to day or daytime variability or assoc excess/  purulent sputum or mucus plugs or hemoptysis or cp or chest tightness, subjective wheeze or overt hb symptoms.   Sleeping on vNIV  without nocturnal  or early am exacerbation  of respiratory  c/o's or need for noct saba. Also denies any obvious fluctuation of symptoms with weather or environmental changes or other aggravating or alleviating factors except as outlined above   No unusual exposure hx or h/o childhood pna/ asthma or knowledge of premature birth.  Current Allergies, Complete Past Medical History, Past Surgical History, Family History, and Social History were reviewed in [employer] record.  ROS  The following are not active complaints unless bolded Hoarseness, sore throat, dysphagia, dental problems, itching, sneezing,  nasal congestion or discharge of excess mucus or purulent secretions, ear ache,   fever, chills, sweats, unintended wt loss or wt  gain, classically pleuritic or exertional cp,  orthopnea pnd or arm/hand swelling  or leg swelling, presyncope, palpitations, abdominal pain, anorexia, nausea, vomiting, diarrhea  or change in bowel habits or change in bladder habits, change in stools or change in urine, dysuria, hematuria,  rash, arthralgias, visual complaints, headache, numbness, weakness or ataxia or problems with walking or coordination,  change in mood or  memory.        Current Meds  Medication Sig  . ALPRAZolam (XANAX) 0.25 MG tablet TAKE 1 TABLET BY MOUTH THREE TIMES DAILY AS NEEDED  . aspirin 81 MG tablet Take 81 mg by mouth daily.  Marland Kitchen atorvastatin (LIPITOR) 20 MG tablet TAKE 1 TABLET BY MOUTH EVERY DAY  . betamethasone dipropionate 0.05 % cream Apply topically 2 (two) times daily. Apply to both feet twice daily for 4 weeks.  . Cholecalciferol (VITAMIN D3) 2000 units TABS Take 2,000 Units by mouth daily.  . famotidine (PEPCID) 20 MG tablet Take by mouth.  . finasteride (PROSCAR) 5 MG tablet Take 5 mg by mouth every morning.  . fluticasone (FLONASE) 50 MCG/ACT nasal spray Place 2 sprays into both nostrils daily.  . furosemide (LASIX) 20 MG tablet Take 20 mg by mouth daily. Take 20 mg daily and an additional 20 mg on MWF afternoons   . levalbuterol (XOPENEX HFA) 45 MCG/ACT inhaler Inhale 2 puffs into the lungs every 4 (four) hours as needed for wheezing or shortness of breath.  . levalbuterol (XOPENEX) 0.63 MG/3ML nebulizer solution USE 1 VIAL VIA NEBULIZER EVERY 4 HOURS AS NEEDED FOR WHEEZING OR SHORTNESS OF BREATH  . metoprolol succinate (TOPROL-XL) 25 MG 24 hr tablet Take 1 tablet (25 mg total) by mouth daily.  . mometasone (NASONEX) 50 MCG/ACT nasal spray Place 2 sprays into the nose daily.  . Multiple Vitamins-Minerals (PRESERVISION AREDS 2) CAPS Take 1 capsule by mouth daily.  . nitroGLYCERIN (NITROSTAT) 0.4 MG SL tablet Place 1 tablet (0.4 mg total) under the tongue every 5 (five) minutes as needed for chest pain.  .  OXYGEN Inhale 2 L continuous into the lungs.   . predniSONE (DELTASONE) 10 MG tablet FOR FLARE OF COUGH/WHEEZING MAY INCREASE TO 2 DAILY BETTER, THEN 1 DAILY  . sodium chloride (MURO 128) 5 % ophthalmic ointment 1 drop.   Marland Kitchen spironolactone (ALDACTONE) 25 MG tablet TAKE 1/2 TABLET BY MOUTH EVERY DAY  . SYMBICORT 160-4.5 MCG/ACT inhaler Inhale 2 puffs into the lungs 2 (two) times daily.  . Tiotropium Bromide Monohydrate (SPIRIVA RESPIMAT) 2.5 MCG/ACT AERS INHALE 2 PUFFS BY MOUTH EVERY DAY  . vitamin B-12 (CYANOCOBALAMIN) 1000 MCG tablet Take 1,000 mcg by mouth daily.  Objective:   Physical Exam   W/c bound elderly hoarse wm    Vital signs reviewed  05/19/2019  - Note at rest 02 sats  98% on 2lpm pulsed    02/11/2019   138  wt 159 04/28/2010  > 10/14/2010 154  > 10/04/2011 150> 12/21/2011  150 > 149 01/29/2012 >154 04/05/2012 > 05/22/2012 148 > 09/30/2012  146 >>144> 10/15/2012 > 12/18/2012  145 > 148 02/14/2013 >148 03/14/2013 > 04/25/2013 148 > 06/19/2013 150 >>07/17/2013 > 08/27/2013 148 > 10/23/2013  148 >153 12/09/2013 > 02/26/2014  153 > 06/01/2014  152 > 157 06/15/2014 > 09/21/2014 146 > 10/19/2014 151 >>148 11/19/14 > 02/24/2015 148 > 06/30/2015    140 >  10/04/2015  136 > 12/20/2015   142 > 06/13/2016  132 > 09/13/2016   133 >  11/07/2016  139 > 01/11/2017   135 > 02/20/2017  135 >  03/22/2017  132 > 05/25/2017 134  > 06/27/2017  135 > 08/28/2017   135 > 09/13/2017   128  > 10/29/2017  129 > 12/10/2017  133 >  09/23/2018  133 > 12/23/2018   137     HEENT : pt wearing mask not removed for exam due to covid -19 concerns.    NECK :  without JVD/Nodes/TM/ nl carotid upstrokes bilaterally   LUNGS: no acc muscle use,  Mod barrel  contour chest wall with bilateral  Distant bs s audible wheeze and  without cough on insp or exp maneuvers and mod  Hyperresonant  to  percussion bilaterally     CV:  RRR  no s3 or murmur or increase in P2, and 2+ pitting both edema   ABD:  soft and nontender with pos mid insp  Hoover's  in the supine position. No bruits or organomegaly appreciated, bowel sounds nl  MS:     ext warm without deformities, calf tenderness, cyanosis or clubbing No obvious joint restrictions but R foot in ortho boot. SKIN: warm and dry without lesions    NEURO:  alert, approp, nl sensorium with  no motor or cerebellar deficits apparent.

## 2019-05-19 NOTE — Patient Instructions (Addendum)
Make sure you check your oxygen saturations at highest level of activity to be sure it stays over 90% and adjust upward to maintain this level if needed but remember to turn it back to previous settings when you stop (to conserve your supply).   See calendar for specific medication instructions and bring it back for each and every office visit for every healthcare provider you see.  Without it,  you may not receive the best quality medical care that we feel you deserve.  You will note that the calendar groups together  your maintenance  medications that are timed at particular times of the day.  Think of this as your checklist for what your doctor has instructed you to do until your next evaluation to see what benefit  there is  to staying on a consistent group of medications intended to keep you well.  The other group at the bottom is entirely up to you to use as you see fit  for specific symptoms that may arise between visits that require you to treat them on an as needed basis.  Think of this as your action plan or "what if" list.   Separating the top medications from the bottom group is fundamental to providing you adequate care going forward.    See your ent doctor re your sinus/ nasal congestion   Please schedule a follow up visit in 3 months but call sooner if needed

## 2019-05-20 ENCOUNTER — Encounter: Payer: Self-pay | Admitting: Internal Medicine

## 2019-05-20 ENCOUNTER — Encounter: Payer: Self-pay | Admitting: Podiatry

## 2019-05-20 NOTE — Assessment & Plan Note (Signed)
Sinus CT 03/03/10 chronic changes only  - rec rechallenge with qnasal 12/21/11  - Trial of 1st gen h1 and hs h2 rec 05/23/2012  - Allergy profile 12/03/2012-negative-IgE 94.8     Food IgE profile-negative - rec rechallnge with 1st gen H1 06/01/14 > could not tolerate either 1st gen H1   nor Zyrtec so recommended go back to Claritin prn 10/19/2014  - reviewed approp use of nasal steroids/ afrin 06/13/2016  - trial of atrovent per allergy rec but had not started as of 10/29/2017 > rec try it and f/u with allergy prn   No improved, refer back to ent

## 2019-05-20 NOTE — Assessment & Plan Note (Signed)
Severe fluid retention, again rec adjust lasix/aldactone as instructed /monitored by cards or PCP but don't want to get too many cooks in the kitchen so I did not make additional recs other than review the action plan already included on his med calendar

## 2019-05-20 NOTE — Assessment & Plan Note (Signed)
Quit smoking 1984/ MM - PFTs  05/04/05 FEV1 36% ratio 28% with 29% improvement after bronchodilators DLCO 48%  - PFT's 03/26/08 30% ratio 34 with 14% improvement after bronchodilators DLC0 38 %  - Referred to rehab 12/19/2012 > completed March 2015  - changed to spiriva respimat 02/14/2013  - started daily prednisone 06/19/13 > improved only a little> tapered off mid July 2015 > ok to change to prn prednisone 08/27/2013  - flared off nasonex and gerd rx 09/21/2014 > resumed - 10/19/2014  p extensive coaching HFA effectiveness =    90%  - changed pred to ceiling of 20 / floor of 5 mg daily as of 10/19/2014 > changed to floor of 10 mg daily 10/04/2015  - PFT's  06/08/2015  FEV1 0.58 (22 % ) ratio 27  p No sign % improvement from saba p symb/spiriva prior to study with DLCO  9/9 % corrects to 27 % for alv volume   - changed pred to ceiling of 20 and floor of 5 mg daily 06/13/16 > increased floor to 10 mg daily effective 02/20/2017  - referred back to rehab 09/13/2016  - try off noct vent and start titrating up xanax 01/11/2017 > no change but then admitted with flu and placed back on vent  - alpha one AT 03/01/17  MM      - 11/09/2017 COPD exacerbation. No improvement with prn zpack. CXR clear. Given course doxy and pred taper. Resp therapy home eval/treat  - Spirometry 09/16/2018  FEV1 0.60  (23%)  Ratio 0.33 at [facility]   Approaching end stage/ nothing else to offer in this setting other than palliative care.    Each maintenance medication was reviewed in detail including most importantly the difference between maintenance and as needed and under what circumstances the prns are to be used. This was done in the context of a medication calendar review which provided the patient with a user-friendly unambiguous mechanism for medication administration and reconciliation and provides an action plan for all active problems. It is critical that this be shown to every doctor  for modification during the office visit if necessary  so the patient can use it as a working document.

## 2019-05-20 NOTE — Assessment & Plan Note (Signed)
Started noct 02 at 2lpm 2008 and on 03/04/09 desat @ > 185 ft so rec wear with activtiy > rm to rm  - 02/15/2011   Walked RA x one lap @ 185 stopped due to  desat > corrected on 2lpm - 02/26/2014  Walked 2lpm  2 laps @ 185 ft each stopped due to  Sob/ sats 88% at nl pace  - 10/19/2014   Walked RA  2 laps @ 185 ft each stopped due to  End of study, slow pace,  sob  But no  desat   05/21/15 Eval by Dr Dedio/ rec trial of NIV  - HCO3  10/27/15 = 34  - HC03  10/06/16  = 28 ? On noct vent (non compliant) - As of admit  02/04/17 back on noct vent/ 02 2lpm 24/7 - HC03 36  08/28/2017 despite reporting using noct vent  - HC03 31  01/04/18  - HC03 32   07/10/18  - HC03  29 12/20/18   I don't really believe NIV has improved his daytime air hunger and he'd be better off if he retained C02 at this point since a higher bicarb results in a lower need for VE and NIV may preclude hospice benefits but for now no change in rx          Each maintenance medication was reviewed in detail including emphasizing most importantly the difference between maintenance and prns and under what circumstances the prns are to be triggered using an action plan format where appropriate.  Total time for H and P, chart review, counseling, teaching device and generating customized AVS unique to this office visit/ reviewing med calendar / charting = 30 min

## 2019-05-21 DIAGNOSIS — R609 Edema, unspecified: Secondary | ICD-10-CM | POA: Diagnosis not present

## 2019-05-21 DIAGNOSIS — M79606 Pain in leg, unspecified: Secondary | ICD-10-CM | POA: Diagnosis not present

## 2019-05-21 DIAGNOSIS — I739 Peripheral vascular disease, unspecified: Secondary | ICD-10-CM | POA: Diagnosis not present

## 2019-05-21 DIAGNOSIS — F324 Major depressive disorder, single episode, in partial remission: Secondary | ICD-10-CM | POA: Diagnosis not present

## 2019-05-21 NOTE — Progress Notes (Signed)
[name], [name] ([id number]) Visit Report for 04/29/2019 Arrival Information Details Patient Name: Date of Service: DO RN, [place] 04/29/2019 1:15 PM Medical Record Number: [id number] Patient Account Number: [id number] Date of Birth/Sex: Treating RN: [birth year] (86 y.o. [name]) [name] Primary Care Sanela Evola: Piedad Climes, CHA RLES Other Clinician: Referring Katyra Tomassetti: Treating Lynsay Fesperman/Extender: Mare Ferrari, CHA RLES Weeks in Treatment: 1 Visit Information History Since Last Visit Added or deleted any medications: No Patient Arrived: Wheel Chair Any new allergies or adverse reactions: No Arrival Time: 13:33 Had a fall or experienced change in No Accompanied By: wife activities of daily living that may affect Transfer Assistance: None risk of falls: Patient Identification Verified: Yes Signs or symptoms of abuse/neglect since last visito No Secondary Verification Process Completed: Yes Hospitalized since last visit: No Patient Has Alerts: Yes Implantable device outside of the clinic excluding No Patient Alerts: Right ABI: [place], TBI: 0 cellular tissue based products placed in the center Left ABI: [place], TBI:0 since last visit: Has Dressing in Place as Prescribed: Yes Pain Present Now: Yes Electronic Signature(s) Signed: 05/21/2019 9:10:01 AM By: [name] Entered By: [name] on 04/29/2019 13:33:56 -------------------------------------------------------------------------------- Clinic Level of Care Assessment Details Patient Name: Date of Service: DO RN, [name] 04/29/2019 1:15 PM Medical Record Number: [id number] Patient Account Number: [id number] Date of Birth/Sex: Treating RN: 09/05/1929 (86 y.o. [name]) [name] Primary Care [name]: Piedad Climes, CHA RLES Other Clinician: Referring [name]: Treating [name]/Extender: Mare Ferrari, CHA RLES Weeks in Treatment: 1 Clinic Level of Care Assessment Items TOOL 4 Quantity Score X- 1 0 Use when only an  EandM is performed on FOLLOW-UP visit ASSESSMENTS - Nursing Assessment / Reassessment X- 1 10 Reassessment of Co-morbidities (includes updates in patient status) X- 1 5 Reassessment of Adherence to Treatment Plan ASSESSMENTS - Wound and Skin A ssessment / Reassessment []  - 0 Simple Wound Assessment / Reassessment - one wound X- 2 5 Complex Wound Assessment / Reassessment - multiple wounds []  - 0 Dermatologic / Skin Assessment (not related to wound area) ASSESSMENTS - Focused Assessment []  - 0 Circumferential Edema Measurements - multi extremities []  - 0 Nutritional Assessment / Counseling / Intervention []  - 0 Lower Extremity Assessment (monofilament, tuning fork, pulses) []  - 0 Peripheral Arterial Disease Assessment (using hand held doppler) ASSESSMENTS - Ostomy and/or Continence Assessment and Care []  - 0 Incontinence Assessment and Management []  - 0 Ostomy Care Assessment and Management (repouching, etc.) PROCESS - Coordination of Care X - Simple Patient / Family Education for ongoing care 1 15 []  - 0 Complex (extensive) Patient / Family Education for ongoing care X- 1 10 Staff obtains Programmer, systems, Records, T Results / Process Orders est []  - 0 Staff telephones HHA, Nursing Homes / Clarify orders / etc []  - 0 Routine Transfer to another Facility (non-emergent condition) []  - 0 Routine Hospital Admission (non-emergent condition) []  - 0 New Admissions / Biomedical engineer / Ordering NPWT Apligraf, etc. , []  - 0 Emergency Hospital Admission (emergent condition) X- 1 10 Simple Discharge Coordination []  - 0 Complex (extensive) Discharge Coordination PROCESS - Special Needs []  - 0 Pediatric / Minor Patient Management []  - 0 Isolation Patient Management []  - 0 Hearing / Language / Visual special needs []  - 0 Assessment of Community assistance (transportation, D/C planning, etc.) []  - 0 Additional assistance / Altered mentation []  - 0 Support Surface(s)  Assessment (bed, cushion, seat, etc.) INTERVENTIONS - Wound Cleansing / Measurement []  - 0  Simple Wound Cleansing - one wound X- 2 5 Complex Wound Cleansing - multiple wounds []  - 0 Wound Imaging (photographs - any number of wounds) []  - 0 Wound Tracing (instead of photographs) []  - 0 Simple Wound Measurement - one wound X- 2 5 Complex Wound Measurement - multiple wounds INTERVENTIONS - Wound Dressings X - Small Wound Dressing one or multiple wounds 2 10 []  - 0 Medium Wound Dressing one or multiple wounds []  - 0 Large Wound Dressing one or multiple wounds X- 1 5 Application of Medications - topical []  - 0 Application of Medications - injection INTERVENTIONS - Miscellaneous []  - 0 External ear exam []  - 0 Specimen Collection (cultures, biopsies, blood, body fluids, etc.) []  - 0 Specimen(s) / Culture(s) sent or taken to Lab for analysis []  - 0 Patient Transfer (multiple staff / Civil Service fast streamer / Similar devices) []  - 0 Simple Staple / Suture removal (25 or less) []  - 0 Complex Staple / Suture removal (26 or more) []  - 0 Hypo / Hyperglycemic Management (close monitor of Blood Glucose) []  - 0 Ankle / Brachial Index (ABI) - do not check if billed separately X- 1 5 Vital Signs Has the patient been seen at the hospital within the last three years: Yes Total Score: 110 Level Of Care: New/Established - Level 3 Electronic Signature(s) Signed: 04/29/2019 5:39:56 PM By: Travis Coria RN Entered By: [name] on 04/29/2019 14:08:48 -------------------------------------------------------------------------------- Encounter Discharge Information Details Patient Name: Date of Service: DO RN, [name] 04/29/2019 1:15 PM Medical Record Number: [id number] Patient Account Number: [id number] Date of Birth/Sex: Treating RN: 1929/03/19 (86 y.o. [name] Primary Care [name]: Piedad Climes, CHA RLES Other Clinician: Referring Tomisha Reppucci: Treating [name]/Extender: Mare Ferrari, CHA RLES Weeks in Treatment: 1 Encounter Discharge Information Items Discharge Condition: Stable Ambulatory Status: Wheelchair Discharge Destination: Home Transportation: Private Auto Accompanied By: wife Schedule Follow-up Appointment: Yes Clinical Summary of Care: Patient Declined Electronic Signature(s) Signed: 04/29/2019 5:57:12 PM By: Kela Millin Entered By: Kela Millin on 04/29/2019 14:31:03 -------------------------------------------------------------------------------- Lower Extremity Assessment Details Patient Name: Date of Service: DO RN, [name] 04/29/2019 1:15 PM Medical Record Number: [id number] Patient Account Number: [id number] Date of Birth/Sex: Treating RN: 28-Jan-1929 (86 y.o. [name] Primary Care Denny Lave: Piedad Climes, CHA RLES Other Clinician: Referring Jaydan Chretien: Treating Oseph Imburgia/Extender: Mare Ferrari, CHA RLES Weeks in Treatment: 1 Edema Assessment Assessed: [Left: No] [Right: No] Edema: [Left: Yes] [Right: Yes] Calf Left: Right: Point of Measurement: cm From Medial Instep 35.3 cm 34 cm Ankle Left: Right: Point of Measurement: cm From Medial Instep 27.5 cm 25.5 cm Vascular Assessment Pulses: Dorsalis Pedis Palpable: [Right:No] Electronic Signature(s) Signed: 04/29/2019 6:13:01 PM By: Levan Hurst RN, BSN Entered By: Levan Hurst on 04/29/2019 13:52:01 -------------------------------------------------------------------------------- Multi Wound Chart Details Patient Name: Date of Service: DO RN, [name] 04/29/2019 1:15 PM Medical Record Number: [id number] Patient Account Number: [id number] Date of Birth/Sex: Treating RN: May 27, 1929 (86 y.o. [name]) [name] Primary Care Deanda Ruddell: Piedad Climes, CHA RLES Other Clinician: Referring Nussen Pullin: Treating Nakeitha Milligan/Extender: Mare Ferrari, CHA RLES Weeks in Treatment: 1 Vital Signs Height(in): 69 Pulse(bpm): 98 Weight(lbs): 132 Blood Pressure(mmHg): 183/95 Body  Mass Index(BMI): 19 Temperature(F): 98.1 Respiratory Rate(breaths/min): 20 Photos: [2:No Photos Right T Great oe] [3:No Photos Right, Medial Foot] [N/A:N/A N/A] Wound Location: [2:Gradually Appeared] [3:Gradually Appeared] [N/A:N/A] Wounding Event: [2:Arterial Insufficiency Ulcer] [3:Pressure Ulcer] [N/A:N/A] Primary Etiology: [2:Cataracts, Asthma, Chronic] [3:Cataracts, Asthma, Chronic] [N/A:N/A] Comorbid History: [2:Obstructive  Pulmonary Disease (COPD), Congestive Heart Failure, Coronary Artery Disease, Hypertension, Peripheral Arterial Disease, Osteoarthritis, Confinement Anxiety 03/31/2019] [3:Obstructive Pulmonary Disease (COPD), Congestive  Heart Failure, Coronary Artery Disease, Hypertension, Peripheral Arterial Disease, Osteoarthritis, Confinement Anxiety 04/29/2019] [N/A:N/A] Date Acquired: [2:1] [3:0] [N/A:N/A] Weeks of Treatment: [2:Open] [3:Open] [N/A:N/A] Wound Status: [2:4x0.8x0.1] [3:0.3x0.4x0.1] [N/A:N/A] Measurements L x W x D (cm) [2:2.513] [3:0.094] [N/A:N/A] A (cm) : rea [2:0.251] [3:0.009] [N/A:N/A] Volume (cm) : [2:-93.90%] [3:0.00%] [N/A:N/A] % Reduction in Area: [2:-93.10%] [3:0.00%] [N/A:N/A] % Reduction in Volume: [2:Full Thickness Without Exposed] [3:Category/Stage II] [N/A:N/A] Classification: [2:Support Structures Medium] [3:Medium] [N/A:N/A] Exudate Amount: [2:Serosanguineous] [3:Serosanguineous] [N/A:N/A] Exudate Type: [2:red, brown] [3:red, brown] [N/A:N/A] Exudate Color: [2:Flat and Intact] [3:Flat and Intact] [N/A:N/A] Wound Margin: [2:None Present (0%)] [3:Large (67-100%)] [N/A:N/A] Granulation Amount: [2:N/A] [3:Pink] [N/A:N/A] Granulation Quality: [2:Large (67-100%)] [3:None Present (0%)] [N/A:N/A] Necrotic Amount: [2:Fat Layer (Subcutaneous Tissue)] [3:Fat Layer (Subcutaneous Tissue)] [N/A:N/A] Exposed Structures: [2:Exposed: Yes Fascia: No Tendon: No Muscle: No Joint: No Bone: No Small (1-33%)] [3:Exposed: Yes Fascia: No Tendon: No Muscle: No Joint:  No Bone: No None] [N/A:N/A] Treatment Notes Electronic Signature(s) Signed: 04/29/2019 5:39:56 PM By: Travis Coria RN Signed: 04/29/2019 5:47:38 PM By: [name] MD Signed: 04/29/2019 5:47:38 PM By: [name] MD Entered By: [name] on 04/29/2019 14:25:57 -------------------------------------------------------------------------------- Multi-Disciplinary Care Plan Details Patient Name: Date of Service: DO RN, [name] 04/29/2019 1:15 PM Medical Record Number: [id number] Patient Account Number: [id number] Date of Birth/Sex: Treating RN: 1929/09/21 (86 y.o. [name]) [name] Primary Care Chasta Deshpande: Piedad Climes, CHA RLES Other Clinician: Referring Jet Traynham: Treating Lennis Rader/Extender: Mare Ferrari, CHA RLES Weeks in Treatment: 1 Active Inactive Abuse / Safety / Falls / Self Care Management Nursing Diagnoses: Potential for falls Potential for injury related to falls Goals: Patient will not experience any injury related to falls Date Initiated: 04/21/2019 Target Resolution Date: 05/23/2019 Goal Status: Active Patient/caregiver will verbalize/demonstrate measures taken to prevent injury and/or falls Date Initiated: 04/21/2019 Target Resolution Date: 05/23/2019 Goal Status: Active Interventions: Assess Activities of Daily Living upon admission and as needed Assess fall risk on admission and as needed Assess: immobility, friction, shearing, incontinence upon admission and as needed Assess impairment of mobility on admission and as needed per policy Assess personal safety and home safety (as indicated) on admission and as needed Assess self care needs on admission and as needed Provide education on fall prevention Provide education on personal and home safety Notes: Nutrition Nursing Diagnoses: Potential for alteratiion in Nutrition/Potential for imbalanced nutrition Goals: Patient/caregiver agrees to and verbalizes understanding of need to use nutritional  supplements and/or vitamins as prescribed Date Initiated: 04/21/2019 Target Resolution Date: 05/23/2019 Goal Status: Active Interventions: Assess patient nutrition upon admission and as needed per policy Notes: Tissue Oxygenation Nursing Diagnoses: Actual ineffective tissue perfusion; peripheral (select once diagnosis is confirmed) Knowledge deficit related to disease process and management Goals: Patient/caregiver will verbalize understanding of disease process and disease management Date Initiated: 04/21/2019 Target Resolution Date: 05/23/2019 Goal Status: Active Interventions: Assess patient understanding of disease process and management upon diagnosis and as needed Assess peripheral arterial status upon admission and as needed Provide education on tissue oxygenation and ischemia Notes: Wound/Skin Impairment Nursing Diagnoses: Impaired tissue integrity Knowledge deficit related to ulceration/compromised skin integrity Goals: Patient/caregiver will verbalize understanding of skin care regimen Date Initiated: 04/21/2019 Target Resolution Date: 05/23/2019 Goal Status: Active Ulcer/skin breakdown will have a volume reduction of 30% by week 4 Date Initiated: 04/21/2019 Target Resolution Date: 05/23/2019 Goal Status: Active Interventions: Assess patient/caregiver  ability to obtain necessary supplies Assess patient/caregiver ability to perform ulcer/skin care regimen upon admission and as needed Assess ulceration(s) every visit Provide education on ulcer and skin care Notes: Electronic Signature(s) Signed: 04/29/2019 5:39:56 PM By: Travis Coria RN Entered By: [name] on 04/29/2019 13:57:19 -------------------------------------------------------------------------------- Pain Assessment Details Patient Name: Date of Service: DO RN, [name] 04/29/2019 1:15 PM Medical Record Number: [id number] Patient Account Number: [id number] Date of Birth/Sex: Treating RN: 1929-03-14 (86  y.o. [name]) [name] Primary Care Zlatan Hornback: Piedad Climes, CHA RLES Other Clinician: Referring Mikal Blasdell: Treating Pattie Flaharty/Extender: Mare Ferrari, CHA RLES Weeks in Treatment: 1 Active Problems Location of Pain Severity and Description of Pain Patient Has Paino Yes Site Locations Rate the pain. Current Pain Level: 4 Pain Management and Medication Current Pain Management: Electronic Signature(s) Signed: 04/29/2019 5:39:56 PM By: Travis Coria RN Signed: 05/21/2019 9:10:01 AM By: [name] Entered By: [name] on 04/29/2019 13:36:04 -------------------------------------------------------------------------------- Patient/Caregiver Education Details Patient Name: Date of Service: DO RN, [name] ID Carlean [name] 4/20/2021andnbsp1:15 PM Medical Record Number: [id number] Patient Account Number: [id number] Date of Birth/Gender: Treating RN: 1929-10-20 (86 y.o. [name]) Dolores Lory, Morey Hummingbird Primary Care Physician: Piedad Climes, CHA RLES Other Clinician: Referring Physician: Treating Physician/Extender: Mare Ferrari, CHA RLES Weeks in Treatment: 1 Education Assessment Education Provided To: Patient Education Topics Provided Safety: Methods: Explain/Verbal Responses: State content correctly Tissue Oxygenation: Methods: Explain/Verbal Responses: State content correctly Electronic Signature(s) Signed: 04/29/2019 5:39:56 PM By: Travis Coria RN Entered By: [name] on 04/29/2019 13:57:42 -------------------------------------------------------------------------------- Wound Assessment Details Patient Name: Date of Service: DO RN, [name] 04/29/2019 1:15 PM Medical Record Number: [id number] Patient Account Number: [id number] Date of Birth/Sex: Treating RN: 1929-09-22 (86 y.o. [name]) [name] Primary Care Yasseen Salls: Piedad Climes, CHA RLES Other Clinician: Referring Ardenia Stiner: Treating Maeci Kalbfleisch/Extender: Mare Ferrari, CHA RLES Weeks in Treatment: 1 Wound Status Wound Number: 2 Primary  Arterial Insufficiency Ulcer Etiology: Wound Location: Right T Great oe Wound Open Wounding Event: Gradually Appeared Status: Date Acquired: 03/31/2019 Comorbid Cataracts, Asthma, Chronic Obstructive Pulmonary Disease Weeks Of Treatment: 1 History: (COPD), Congestive Heart Failure, Coronary Artery Disease, Clustered Wound: No Hypertension, Peripheral Arterial Disease, Osteoarthritis, Confinement Anxiety Photos Photo Uploaded By: Mikeal Hawthorne on 04/30/2019 10:58:44 Wound Measurements Length: (cm) 4 Width: (cm) 0.8 Depth: (cm) 0.1 Area: (cm) 2.513 Volume: (cm) 0.251 % Reduction in Area: -93.9% % Reduction in Volume: -93.1% Epithelialization: Small (1-33%) Tunneling: No Undermining: No Wound Description Classification: Full Thickness Without Exposed Support Structures Wound Margin: Flat and Intact Exudate Amount: Medium Exudate Type: Serosanguineous Exudate Color: red, brown Foul Odor After Cleansing: No Slough/Fibrino No Wound Bed Granulation Amount: None Present (0%) Exposed Structure Necrotic Amount: Large (67-100%) Fascia Exposed: No Necrotic Quality: Adherent Slough Fat Layer (Subcutaneous Tissue) Exposed: Yes Tendon Exposed: No Muscle Exposed: No Joint Exposed: No Bone Exposed: No Electronic Signature(s) Signed: 04/29/2019 5:39:56 PM By: Travis Coria RN Signed: 04/29/2019 6:13:01 PM By: Levan Hurst RN, BSN Entered By: Levan Hurst on 04/29/2019 13:54:35 -------------------------------------------------------------------------------- Wound Assessment Details Patient Name: Date of Service: DO RN, [name] 04/29/2019 1:15 PM Medical Record Number: [id number] Patient Account Number: [id number] Date of Birth/Sex: Treating RN: 02-22-1929 (86 y.o. [name]) [name] Primary Care Zacharius Funari: Piedad Climes, CHA RLES Other Clinician: Referring Teagen Mcleary: Treating Taiquan Campanaro/Extender: Mare Ferrari, CHA RLES Weeks in Treatment: 1 Wound Status Wound Number: 3  Primary Pressure Ulcer Etiology: Wound Location: Right, Medial Foot Wound Open Wounding Event: Gradually Appeared Status: Date Acquired:  04/29/2019 Comorbid Cataracts, Asthma, Chronic Obstructive Pulmonary Disease Weeks Of Treatment: 0 History: (COPD), Congestive Heart Failure, Coronary Artery Disease, Clustered Wound: No Hypertension, Peripheral Arterial Disease, Osteoarthritis, Confinement Anxiety Photos Photo Uploaded By: Mikeal Hawthorne on 04/30/2019 10:58:45 Wound Measurements Length: (cm) 0.3 Width: (cm) 0.4 Depth: (cm) 0.1 Area: (cm) 0.094 Volume: (cm) 0.009 % Reduction in Area: 0% % Reduction in Volume: 0% Epithelialization: None Tunneling: No Undermining: No Wound Description Classification: Category/Stage II Wound Margin: Flat and Intact Exudate Amount: Medium Exudate Type: Serosanguineous Exudate Color: red, brown Foul Odor After Cleansing: No Slough/Fibrino No Wound Bed Granulation Amount: Large (67-100%) Exposed Structure Granulation Quality: Pink Fascia Exposed: No Necrotic Amount: None Present (0%) Fat Layer (Subcutaneous Tissue) Exposed: Yes Tendon Exposed: No Muscle Exposed: No Joint Exposed: No Bone Exposed: No Electronic Signature(s) Signed: 04/29/2019 5:39:56 PM By: Travis Coria RN Signed: 04/29/2019 6:13:01 PM By: Levan Hurst RN, BSN Entered By: Levan Hurst on 04/29/2019 13:55:15 -------------------------------------------------------------------------------- Vitals Details Patient Name: Date of Service: DO RN, [name] 04/29/2019 1:15 PM Medical Record Number: [id number] Patient Account Number: [id number] Date of Birth/Sex: Treating RN: 1929-12-21 (86 y.o. [name]) Dolores Lory, [place] Primary Care Johanthan Kneeland: Piedad Climes, CHA RLES Other Clinician: Referring Davisha Linthicum: Treating Kash Davie/Extender: Mare Ferrari, CHA RLES Weeks in Treatment: 1 Vital Signs Time Taken: 13:34 Temperature (F): 98.1 Height (in): 69 Pulse (bpm): 98 Weight (lbs):  132 Respiratory Rate (breaths/min): 20 Body Mass Index (BMI): 19.5 Blood Pressure (mmHg): 183/95 Reference Range: 80 - 120 mg / dl Electronic Signature(s) Signed: 05/21/2019 9:10:01 AM By: [name] Entered By: [name] on 04/29/2019 13:36:00

## 2019-05-22 NOTE — Progress Notes (Signed)
COMMUNITY PALLIATIVE CARE RN NOTE  PATIENT NAME: [name] DOB: [birth year] MRN: [id number]  PRIMARY CARE PROVIDER: [name], MD  RESPONSIBLE PARTY: Brien Few (wife) Acct ID - Guarantor Home Phone Work Phone Relationship Acct Type  [id number] - Merkle,Levar L [phone]  Self P/F     [place], [name], [place] [address]   Covid-19 Pre-screening Negative  PLAN OF CARE and INTERVENTION:  1. ADVANCE CARE PLANNING/GOALS OF CARE: Goal is for patient to remain at home with his wife. He is a Full code. 2. PATIENT/CAREGIVER EDUCATION: Pain management, safe mobility/transfers, wound management, s/s of infection 3. DISEASE STATUS: Met with patient and and his wife, [name]. He remains alert and oriented x 3 and able to engage in appropriate conversation. He has been c/o increasing pain in his legs from edema and poor circulation, especially during the night. Some nights it does interfere with his sleep. He has had to take Tylenol 2-3 days this week. He also has a wound to his right foot d/t some blockages he has in his right lower extremity. He goes to the wound center every 2 weeks and his wife performs his dressing changes every other day. She says that the wound looks slightly better with the current treatment. He has a rash on the top of his foot and a TCA cream is being applied daily. He wears a boot on his right foot that makes walking more difficult. He is ambulating at a much slower rate d/t increased weakness and fatigue. He is using a cane to walk up his stairs. They have an appointment for a home assessment for a chair lift. Today was the first day that his wife had to assist him with getting dressed. If he performs this task independently, it takes him about 2 hours. We discussed hiring an in-home caregiver. He wants to take more time to think about this. He also has issues with 2-3+ pitting edema in his bilateral lower extremities. R>L. This is also a source of his pain. He is taking  diuretics, but when these are increased he says that his blood pressure drops and he feels much weaker. He does try and keep them elevated as much as possible. He also states that he is performing daily exercises while sitting in a chair and also walks in place to maintain as much strength as possible. He continues on Oxygen at 2L/min via [place] continuously and uses his Trilogy machine at night. He is using his inhaler 3x/day and his nebulizer about 1-2x/week if his breathing worsens. He continues with issue with sinus drainage and pressure. He does saline rinses daily and uses Flonase. He feels that his drainage makes it difficult to eat at times. His appetite is stable overall. He is eating 3 meal/day, but slightly smaller portion sizes. He has an upcoming appointment with Pulmonology. Will continue to monitor.    HISTORY OF PRESENT ILLNESS: This is a 86 yo male with a h/o CHF, PVD, CAD COPD, and NSTEMI. Palliative care team continues to follow patient and visits monthly and PRN.    CODE STATUS: Full code ADVANCED DIRECTIVES: Y MOST FORM: no PPS: 50%   PHYSICAL EXAM:   VITALS: Today's Vitals   05/15/19 1435  BP: 122/68  Pulse: 65  Resp: 20  Temp: 98 F (36.7 C)  TempSrc: Temporal  SpO2: 97%  PainSc: 3   PainLoc: Foot    LUNGS: clear to auscultation  CARDIAC: Cor RRR EXTREMITIES: 2+, 3+ and pitting edema, R>L  SKIN: Wound to R foot  NEURO: Alert and oriented x 3, increased generalized weakness, HOH, ambulatory (uses cane for stairs)   (Duration of visit and documentation 60 minutes)   [name], RN BSN

## 2019-05-27 ENCOUNTER — Encounter (HOSPITAL_BASED_OUTPATIENT_CLINIC_OR_DEPARTMENT_OTHER): Payer: Medicare Other | Admitting: Internal Medicine

## 2019-05-27 ENCOUNTER — Other Ambulatory Visit: Payer: Self-pay

## 2019-05-27 DIAGNOSIS — M1990 Unspecified osteoarthritis, unspecified site: Secondary | ICD-10-CM | POA: Diagnosis not present

## 2019-05-27 DIAGNOSIS — I87323 Chronic venous hypertension (idiopathic) with inflammation of bilateral lower extremity: Secondary | ICD-10-CM | POA: Diagnosis not present

## 2019-05-27 DIAGNOSIS — J449 Chronic obstructive pulmonary disease, unspecified: Secondary | ICD-10-CM | POA: Diagnosis not present

## 2019-05-27 DIAGNOSIS — I70235 Atherosclerosis of native arteries of right leg with ulceration of other part of foot: Secondary | ICD-10-CM | POA: Diagnosis not present

## 2019-05-27 DIAGNOSIS — L97511 Non-pressure chronic ulcer of other part of right foot limited to breakdown of skin: Secondary | ICD-10-CM | POA: Diagnosis not present

## 2019-05-27 DIAGNOSIS — I110 Hypertensive heart disease with heart failure: Secondary | ICD-10-CM | POA: Diagnosis not present

## 2019-05-29 ENCOUNTER — Other Ambulatory Visit: Payer: Self-pay | Admitting: *Deleted

## 2019-05-29 NOTE — Progress Notes (Addendum)
[name], [name] [name] ([id number]) Visit Report for 05/27/2019 Debridement Details Patient Name: Date of Service: DO RN, [name] 05/27/2019 2:00 PM Medical Record Number: [id number] Patient Account Number: [id number] Date of Birth/Sex: Treating RN: May 03, 1929 (86 y.o. [name]) [name] Primary Care Provider: Piedad Climes, CHA RLES Other Clinician: Referring Provider: Treating Provider/Extender: Mare Ferrari, CHA RLES Weeks in Treatment: 5 Debridement Performed for Assessment: Wound #7 Right,Proximal,Lateral Foot Performed By: Physician Ricard Dillon., MD Debridement Type: Chemical/Enzymatic/Mechanical Agent Used: wound cleanser Severity of Tissue Pre Debridement: Limited to breakdown of skin Level of Consciousness (Pre-procedure): Awake and Alert Pre-procedure Verification/Time Out Yes - 15:10 Taken: Start Time: 15:10 Pain Control: Other : benzocaine Bleeding: Minimum Hemostasis Achieved: Pressure End Time: 15:15 Procedural Pain: 0 Post Procedural Pain: 0 Response to Treatment: Procedure was tolerated well Level of Consciousness (Post- Awake and Alert procedure): Post Debridement Measurements of Total Wound Length: (cm) 0.7 Width: (cm) 0.1 Depth: (cm) 0.1 Volume: (cm) 0.005 Character of Wound/Ulcer Post Debridement: Improved Severity of Tissue Post Debridement: Fat layer exposed Post Procedure Diagnosis Same as Pre-procedure Notes mechanical debridement with wound cleanser and gauze Electronic Signature(s) Signed: 05/30/2019 9:36:08 AM By: [name] RN Signed: 05/30/2019 6:00:23 PM By: [name] MD Entered By: [name] on 05/30/2019 09:24:00 -------------------------------------------------------------------------------- Debridement Details Patient Name: Date of Service: DO RN, [name] 05/27/2019 2:00 PM Medical Record Number: [id number] Patient Account Number: [id number] Date of Birth/Sex: Treating RN: 14-Mar-1929 (86 y.o. [name]) [name] Primary Care  Provider: Piedad Climes, CHA RLES Other Clinician: Referring Provider: Treating Provider/Extender: Mare Ferrari, CHA RLES Weeks in Treatment: 5 Debridement Performed for Assessment: Wound #5 Right Calcaneus Performed By: Physician Ricard Dillon., MD Debridement Type: Chemical/Enzymatic/Mechanical Agent Used: wound cleanser Severity of Tissue Pre Debridement: Fat layer exposed Level of Consciousness (Pre-procedure): Awake and Alert Pre-procedure Verification/Time Out Yes - 15:10 Taken: Start Time: 15:10 Pain Control: Other : benzocaine Bleeding: Minimum Hemostasis Achieved: Pressure End Time: 15:15 Procedural Pain: 0 Post Procedural Pain: 0 Response to Treatment: Procedure was tolerated well Level of Consciousness (Post- Awake and Alert procedure): Post Debridement Measurements of Total Wound Length: (cm) 1 Width: (cm) 0.3 Depth: (cm) 0.2 Volume: (cm) 0.047 Character of Wound/Ulcer Post Debridement: Improved Severity of Tissue Post Debridement: Fat layer exposed Post Procedure Diagnosis Same as Pre-procedure Notes mechanical debridement with wound cleanser and gauze Electronic Signature(s) Signed: 05/30/2019 9:36:08 AM By: [name] RN Signed: 05/30/2019 6:00:23 PM By: [name] MD Entered By: [name] on 05/30/2019 09:24:15 -------------------------------------------------------------------------------- Debridement Details Patient Name: Date of Service: DO RN, [name] 05/27/2019 2:00 PM Medical Record Number: [id number] Patient Account Number: [id number] Date of Birth/Sex: Treating RN: 11-24-1929 (86 y.o. [name]) [name] Primary Care Provider: Piedad Climes, CHA RLES Other Clinician: Referring Provider: Treating Provider/Extender: Mare Ferrari, CHA RLES Weeks in Treatment: 5 Debridement Performed for Assessment: Wound #6 Right,Distal,Lateral Foot Performed By: Physician Ricard Dillon., MD Debridement Type:  Chemical/Enzymatic/Mechanical Agent Used: wound cleanser Severity of Tissue Pre Debridement: Fat layer exposed Level of Consciousness (Pre-procedure): Awake and Alert Pre-procedure Verification/Time Out Yes - 15:10 Taken: Start Time: 15:10 Pain Control: Other : benzocaine Bleeding: Minimum Hemostasis Achieved: Pressure End Time: 15:15 Procedural Pain: 0 Post Procedural Pain: 0 Response to Treatment: Procedure was tolerated well Level of Consciousness (Post- Awake and Alert procedure): Post Debridement Measurements of Total Wound Length: (cm) 1 Width: (cm) 0.4 Depth: (cm) 0.1 Volume: (cm) 0.031 Character of Wound/Ulcer Post Debridement: Improved  Severity of Tissue Post Debridement: Fat layer exposed Post Procedure Diagnosis Same as Pre-procedure Notes mechanical debridement with wound cleanser and gauze Electronic Signature(s) Signed: 05/30/2019 9:36:08 AM By: [name] RN Signed: 05/30/2019 6:00:23 PM By: [name] MD Entered By: [name] on 05/30/2019 09:24:27 -------------------------------------------------------------------------------- HPI Details Patient Name: Date of Service: DO RN, [name] 05/27/2019 2:00 PM Medical Record Number: [id number] Patient Account Number: [id number] Date of Birth/Sex: Treating RN: 1929-02-04 (86 y.o. [name]) [name] Primary Care Provider: Piedad Climes, CHA RLES Other Clinician: Referring Provider: Treating Provider/Extender: Mare Ferrari, CHA RLES Weeks in Treatment: 5 History of Present Illness HPI Description: 01/30/18 on evaluation today patient presents for initial inspection concerning the skin tear of the right forearm. Fortunately there does not appear to be any evidence of infection and this occurred approximately three days ago. He has been using Vaseline over the region. With that being said the patient does have a history of cataracts COPD, hypertension, peripheral arterial disease, and osteoarthritis. He tells  me that he does have a little bit of pain at the site but nothing too significant at this time which is good news. Fortunately this overall appears to be doing fairly well which is good news. No fevers, chills, nausea, or vomiting noted at this time. The biggest issue at this point is that the wound is of quite significant size. READMISSION 04/21/2019 This is a 86 year old man with multiple medical problems who was seen once in the clinic here in January 2020 with a skin tear on his right forearm seen by [name]. This problem this time started sometime recently. He was noted by podiatry to have erosions and wounds of his right foot on toes 1-3. He also saw [name] of dermatology who gave him Silvadene cream for what I think was felt to be a stasis dermatitis of his bilateral lower legs. At some point he was referred to [name]. He was also noted to have previous noninvasive arterial studies that showed no flow to either one of his toes and noncompressible ABIs bilaterally. [name] did noninvasive arterial studies on him. These showed that he had greater than 75% stenosis in the proximal and mid common femoral artery. Monophasic flow in the posterior tibial and dorsalis pedis positions on the right foot. ABIs again were noncompressible. He was felt to have total occlusion of the superficial femoral artery on the right. The overall interpretation is that he had severe multilevel arterial occlusive disease. Absent femoral pulses and superficial femoral artery occlusions. In spite of this he was not felt to be candidate for arteriography given his bilateral femoral artery occlusions as well he would not be a candidate for endovascular approach secondary to his common femoral artery occlusions and proximal iliac disease. He was not felt to be a candidate for an open infra inguinal bypass. He therefore felt he would need to be monitored over time with the only option being a right  below-knee amputation The patient complains currently of pain at night when he is up in bed. The pain in the right foot is better when he puts the leg down over the bed side. This is compatible with rest pain. He has very limited activity i.e. is even exhausted getting dressed in the morning because of cardiopulmonary issues therefore it is difficult to gauge claudication I believe he was referred here by Dr. [name] with one of the major issues is whether he would be a candidate for hyperbaric  oxygen. He is not a diabetic. Dr. [name] did give him Silvadene cream for dry flaking skin in his bilateral lower extremities, however the patient is concerned about using this in the face of sulfa allergy. 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 This is on the medial aspect extended there is a second area on the plantar aspect. Most of what the patient talks about is claudication with minimal activity [however the patient is limited by his COPD], or even at rest at night. He is not getting a lot of sleep. We use silver collagen on this wound 5/4; the patient has a wound on the tip of his right great toe also on the medial aspect. Setting of severe nonrevascularizable PAD [see description in HPI]. He does not describe any change in his pain. We have been using silver collagen 5/18; 2-week follow-up. The patient has wound on the tip of his right great toe as well as the medial aspect. He has developed new wounds x2 on the right lateral foot and the right lateral calcaneus. These look like ischemic areas. The patient talks about claudication at night. He seems to be more  comfortable during the day. We have been using silver collagen to the wounds. I have previously debrided the eschar on the toes however this comes right back. Electronic Signature(s) Signed: 05/29/2019 12:52:55 PM By: [name] MD Entered By: [name] on 05/27/2019 17:52:45 -------------------------------------------------------------------------------- Physical Exam Details Patient Name: Date of Service: DO RN, [name] 05/27/2019 2:00 PM Medical Record Number: [id number] Patient Account Number: [id number] Date of Birth/Sex: Treating RN: January 01, 1930 (86 y.o. [name]) [name] Primary Care Provider: Piedad Climes, CHA RLES Other Clinician: Referring Provider: Treating Provider/Extender: Mare Ferrari, CHA RLES Weeks in Treatment: 5 Constitutional Patient is hypertensive.. Pulse regular and within target range for patient.Marland Kitchen Respirations regular, non-labored and within target range.. Temperature is normal and within the target range for the patient.Marland Kitchen Appears in no distress. Ears, Nose, Mouth, and Throat Patient is hard of hearing.Marland Kitchen Respiratory Patient is on chronic oxygen for COPD. Cardiovascular Pedal pulses are absent on the right. He does not have a popliteal pulse. Edema present in both extremities. Nonpitting.. Notes Wound exam; wound on the tip of the right great toe and the medial part of the toe thick adherent eschar. I did not remove this today it did not seem to maintain after I did this last time. He has small linear ischemic-looking wounds on the right lateral foot and right calcaneus that are new. A lot of edema in the foot that is nonpitting. Foot is cool. Electronic Signature(s) Signed: 05/29/2019 12:52:55 PM By: [name] MD Entered By: [name] on 05/27/2019 17:54:33 -------------------------------------------------------------------------------- Physician Orders Details Patient Name: Date of Service: DO RN, [name] 05/27/2019 2:00 PM Medical  Record Number: [id number] Patient Account Number: [id number] Date of Birth/Sex: Treating RN: 1929-10-05 (86 y.o. [name]) [name] Primary Care Provider: Piedad Climes, CHA RLES Other Clinician: Referring Provider: Treating Provider/Extender: Mare Ferrari, CHA RLES Weeks in Treatment: 5 Verbal / Phone Orders: No Diagnosis Coding ICD-10 Coding Code Description I70.235 Atherosclerosis of native arteries of right leg with ulceration of other part of  foot L97.511 Non-pressure chronic ulcer of other part of right foot limited to breakdown of skin I87.323 Chronic venous hypertension (idiopathic) with inflammation of bilateral lower extremity Follow-up Appointments Return Appointment in 2 weeks. Dressing Change Frequency Wound #2 Right T Great oe Change Dressing every other day. Skin Barriers/Peri-Wound Care Moisturizing lotion - to both legs and feet daily TCA Cream or Ointment Wound Cleansing May shower and wash wound with soap and water. - on days that dressing is changed Primary Wound Dressing Wound #2 Right T Great oe Silver Collagen - moisten with hydrogel or KY jelly Wound #4 Right,Medial T Great oe Silver Collagen - moisten with hydrogel or KY jelly Wound #5 Right Calcaneus Silver Collagen - moisten with hydrogel or KY jelly Wound #6 Right,Distal,Lateral Foot Silver Collagen - moisten with hydrogel or KY jelly Wound #7 Right,Proximal,Lateral Foot Silver Collagen - moisten with hydrogel or KY jelly Secondary Dressing Wound #2 Right T Great oe Foam Kerlix/Rolled Gauze Dry Gauze Wound #4 Right,Medial T Great oe Kerlix/Rolled Gauze Dry Gauze Heel Cup Wound #5 Right Calcaneus Kerlix/Rolled Gauze Dry Gauze Heel Cup Wound #6 Right,Distal,Lateral Foot Kerlix/Rolled Gauze Dry Gauze Heel Cup Wound #7 Right,Proximal,Lateral Foot Kerlix/Rolled Gauze Dry Gauze Heel Cup Off-Loading Open toe surgical shoe to: - right foot Electronic Signature(s) Signed: 05/27/2019  5:13:38 PM By: [name] RN Signed: 05/29/2019 12:52:55 PM By: [name] MD Entered By: [name] on 05/27/2019 15:51:57 -------------------------------------------------------------------------------- Problem List Details Patient Name: Date of Service: DO RN, [name] 05/27/2019 2:00 PM Medical Record Number: [id number] Patient Account Number: [id number] Date of Birth/Sex: Treating RN: 11/30/1929 (86 y.o. [name]) [name] Primary Care Provider: Piedad Climes, CHA RLES Other Clinician: Referring Provider: Treating Provider/Extender: Mare Ferrari, CHA RLES Weeks in Treatment: 5 Active Problems ICD-10 Encounter Code Description Active Date MDM Diagnosis I70.235 Atherosclerosis of native arteries of right leg with ulceration of other part of 04/21/2019 No Yes foot L97.511 Non-pressure chronic ulcer of other part of right foot limited to breakdown of 04/21/2019 No Yes skin I87.323 Chronic venous hypertension (idiopathic) with inflammation of bilateral lower 04/21/2019 No Yes extremity Inactive Problems Resolved Problems Electronic Signature(s) Signed: 05/29/2019 12:52:55 PM By: [name] MD Previous Signature: 05/27/2019 5:13:38 PM Version By: [name] RN Entered By: [name] on 05/27/2019 17:50:10 -------------------------------------------------------------------------------- Progress Note Details Patient Name: Date of Service: DO RN, [name] 05/27/2019 2:00 PM Medical Record Number: [id number] Patient Account Number: [id number] Date of Birth/Sex: Treating RN: 11-16-1929 (86 y.o. [name]) [name] Primary Care Provider: Piedad Climes, CHA RLES Other Clinician: Referring Provider: Treating Provider/Extender: Mare Ferrari, CHA RLES Weeks in Treatment: 5 Subjective History of Present Illness (HPI) 01/30/18 on evaluation today patient presents for initial inspection concerning the skin tear of the right forearm. Fortunately there does not appear to be  any evidence of infection and this occurred approximately three days ago. He has been using Vaseline over the region. With that being said the patient does have a history of cataracts COPD, hypertension, peripheral arterial disease, and osteoarthritis. He tells me that he does have a little bit of pain at the site but nothing too significant at this time which is good news. Fortunately this overall appears to be doing fairly well which is good news. No fevers, chills, nausea, or vomiting noted at this time. The biggest issue at this point is that the wound is of quite significant size. READMISSION 04/21/2019 This is a 86 year old man with multiple medical problems who  was seen once in the clinic here in January 2020 with a skin tear on his right forearm seen by [name]. This problem this time started sometime recently. He was noted by podiatry to have erosions and wounds of his right foot on toes 1-3. He also saw [name] of dermatology who gave him Silvadene cream for what I think was felt to be a stasis dermatitis of his bilateral lower legs. At some point he was referred to [name]. He was also noted to have previous noninvasive arterial studies that showed no flow to either one of his toes and noncompressible ABIs bilaterally. [name] did noninvasive arterial studies on him. These showed that he had greater than 75% stenosis in the proximal and mid common femoral artery. Monophasic flow in the posterior tibial and dorsalis pedis positions on the right foot. ABIs again were noncompressible. He was felt to have total occlusion of the superficial femoral artery on the right. The overall interpretation is that he had severe multilevel arterial occlusive disease. Absent femoral pulses and superficial femoral artery occlusions. In spite of this he was not felt to be candidate for arteriography given his bilateral femoral artery occlusions as well he would not be a candidate for endovascular  approach secondary to his common femoral artery occlusions and proximal iliac disease. He was not felt to be a candidate for an open infra inguinal bypass. He therefore felt he would need to be monitored over time with the only option being a right below-knee amputation The patient complains currently of pain at night when he is up in bed. The pain in the right foot is better when he puts the leg down over the bed side. This is compatible with rest pain. He has very limited activity i.e. is even exhausted getting dressed in the morning because of cardiopulmonary issues therefore it is difficult to gauge claudication I believe he was referred here by Dr. [name] with one of the major issues is whether he would be a candidate for hyperbaric oxygen. He is not a diabetic. Dr. [name] did give him Silvadene cream for dry flaking skin in his bilateral lower extremities, however the patient is concerned about using this in the face of sulfa allergy. 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 This is on the medial aspect extended there is a second area on the plantar aspect. Most of what the patient talks about is claudication with minimal activity [however the patient is limited by his COPD], or even at rest at night. He is not getting a lot of sleep. We use silver collagen on this wound 5/4; the patient has a wound on the tip of his right great toe also on the medial aspect. Setting of severe nonrevascularizable PAD [see description in HPI]. He does not describe any change in his pain. We have been using silver collagen 5/18; 2-week follow-up. The patient has wound on the tip of  his right great toe as well as the medial aspect. He has developed new wounds x2 on the right lateral foot and the right lateral calcaneus. These look like ischemic areas. The patient talks about claudication at night. He seems to be more comfortable  during the day. We have been using silver collagen to the wounds. I have previously debrided the eschar on the toes however this comes right back. Objective Constitutional Patient is hypertensive.. Pulse regular and within target range for patient.Marland Kitchen Respirations regular, non-labored and within target range.. Temperature is normal and within the target range for the patient.Marland Kitchen Appears in no distress. Vitals Time Taken: 2:51 PM, Height: 69 in, Weight: 132 lbs, BMI: 19.5, Temperature: 98.4 F, Pulse: 83 bpm, Respiratory Rate: 18 breaths/min, Blood Pressure: 153/66 mmHg. Ears, Nose, Mouth, and Throat Patient is hard of hearing.Marland Kitchen Respiratory Patient is on chronic oxygen for COPD. Cardiovascular Pedal pulses are absent on the right. He does not have a popliteal pulse. Edema present in both extremities. Nonpitting.. General Notes: Wound exam; wound on the tip of the right great toe and the medial part of the toe thick adherent eschar. I did not remove this today it did not seem to maintain after I did this last time. He has small linear ischemic-looking wounds on the right lateral foot and right calcaneus that are new. A lot of edema in the foot that is nonpitting. Foot is cool. Integumentary (Hair, Skin) Wound #2 status is Open. Original cause of wound was Gradually Appeared. The wound is located on the Right T Great. The wound measures 0.5cm length x oe 0.8cm width x 0.1cm depth; 0.314cm^2 area and 0.031cm^3 volume. There is Fat Layer (Subcutaneous Tissue) Exposed exposed. There is no tunneling or undermining noted. There is a small amount of serosanguineous drainage noted. The wound margin is flat and intact. There is medium (34-66%)  granulation within the wound bed. There is a medium (34-66%) amount of necrotic tissue within the wound bed including Adherent Slough. Wound #4 status is Open. Original cause of wound was Gradually Appeared. The wound is located on the Right,Medial T Great. The wound measures 2cm oe length x 1.4cm width x 0.1cm depth; 2.199cm^2 area and 0.22cm^3 volume. There is Fat Layer (Subcutaneous Tissue) Exposed exposed. There is no tunneling or undermining noted. There is a medium amount of serous drainage noted. The wound margin is flat and intact. There is no granulation within the wound bed. There is a large (67-100%) amount of necrotic tissue within the wound bed including Adherent Slough. Wound #5 status is Open. Original cause of wound was Gradually Appeared. The wound is located on the Right Calcaneus. The wound measures 1cm length x 0.3cm width x 0.2cm depth; 0.236cm^2 area and 0.047cm^3 volume. There is Fat Layer (Subcutaneous Tissue) Exposed exposed. There is no tunneling or undermining noted. There is a medium amount of serosanguineous drainage noted. The wound margin is flat and intact. There is large (67-100%) pink granulation within the wound bed. There is no necrotic tissue within the wound bed. Wound #6 status is Open. Original cause of wound was Gradually Appeared. The wound is located on the Right,Distal,Lateral Foot. The wound measures 1cm length x 0.4cm width x 0.1cm depth; 0.314cm^2 area and 0.031cm^3 volume. There is no tunneling or undermining noted. There is a medium amount of serosanguineous drainage noted. The wound margin is flat and intact. There is no granulation within the wound bed. There is a large (67-100%) amount of necrotic tissue within the wound bed including Adherent Slough. Wound #7 status is Open. Original cause of wound was Gradually Appeared. The wound is located on the Right,Proximal,Lateral Foot. The wound measures 0.7cm length x 0.1cm width x 0.1cm depth; 0.055cm^2  area and 0.005cm^3 volume. There is no tunneling or  undermining noted. There is a medium amount of serosanguineous drainage noted. The wound margin is flat and intact. There is no granulation within the wound bed. There is a large (67-100%) amount of necrotic tissue within the wound bed including Adherent Slough. Assessment Active Problems ICD-10 Atherosclerosis of native arteries of right leg with ulceration of other part of foot Non-pressure chronic ulcer of other part of right foot limited to breakdown of skin Chronic venous hypertension (idiopathic) with inflammation of bilateral lower extremity Procedures Wound #5 Pre-procedure diagnosis of Wound #5 is an Arterial Insufficiency Ulcer located on the Right Calcaneus .Severity of Tissue Pre Debridement is: Fat layer exposed. There was a Chemical/Enzymatic/Mechanical debridement performed by Ricard Dillon., MD. after achieving pain control using Other (benzocaine). Other agent used was wound cleanser. A time out was conducted at 15:10, prior to the start of the procedure. A Minimum amount of bleeding was controlled with Pressure. The procedure was tolerated well with a pain level of 0 throughout and a pain level of 0 following the procedure. Post Debridement Measurements: 1cm length x 0.3cm width x 0.2cm depth; 0.047cm^3 volume. Character of Wound/Ulcer Post Debridement is improved. Severity of Tissue Post Debridement is: Fat layer exposed. Post procedure Diagnosis Wound #5: Same as Pre-Procedure General Notes: mechanical debridement with wound cleanser and gauze. 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 There was a Chemical/Enzymatic/Mechanical debridement performed by Ricard Dillon., MD. after achieving pain control using Other (benzocaine). Other agent used was wound cleanser. A time out was  conducted at 15:10, prior to the start of the procedure. A Minimum amount of bleeding was controlled with Pressure. The procedure was tolerated well with a pain level of 0 throughout and a pain level of 0 following the procedure. Post Debridement Measurements: 1cm length x 0.4cm width x 0.1cm depth; 0.031cm^3 volume. Character of Wound/Ulcer Post Debridement is improved. Severity of Tissue Post Debridement is: Fat layer exposed. Post procedure Diagnosis Wound #6: Same as Pre-Procedure General Notes: mechanical debridement with wound cleanser and gauze. Wound #7 Pre-procedure diagnosis of Wound #7 is an Arterial Insufficiency Ulcer located on the Right,Proximal,Lateral Foot .Severity of Tissue Pre Debridement is: Limited to breakdown of skin. There was a Chemical/Enzymatic/Mechanical debridement performed by Ricard Dillon., MD. after achieving pain control using Other (benzocaine). Other agent used was wound cleanser. A time out was conducted at 15:10, prior to the start of the procedure. A Minimum amount of bleeding was controlled with Pressure. The procedure was tolerated well with a pain level of 0 throughout and a pain level of 0 following the procedure. Post Debridement Measurements: 0.7cm length x 0.1cm width x 0.1cm depth; 0.005cm^3 volume. Character of Wound/Ulcer Post Debridement is improved. Severity of Tissue Post Debridement is: Fat layer exposed. Post procedure Diagnosis Wound #7: Same as Pre-Procedure General Notes: mechanical debridement with wound cleanser and gauze. Plan Follow-up Appointments: Return Appointment in 2 weeks. Dressing Change Frequency: Wound #2 Right T Great: oe Change Dressing every other day. Skin Barriers/Peri-Wound Care: Moisturizing lotion - to both legs and feet daily TCA Cream or Ointment Wound Cleansing: May shower and wash wound with soap and water. - on days that dressing is changed Primary Wound Dressing: Wound #2 Right T Great: oe Silver  Collagen - moisten with hydrogel or KY jelly Wound #4 Right,Medial T Great: oe Silver Collagen - moisten with hydrogel or KY jelly Wound #5  Right Calcaneus: Silver Collagen - moisten with hydrogel or KY jelly Wound #6 Right,Distal,Lateral Foot: Silver Collagen - moisten with hydrogel or KY jelly Wound #7 Right,Proximal,Lateral Foot: Silver Collagen - moisten with hydrogel or KY jelly Secondary Dressing: Wound #2 Right T Great: oe Foam Kerlix/Rolled Gauze Dry Gauze Wound #4 Right,Medial T Great: oe Kerlix/Rolled Gauze Dry Gauze Heel Cup Wound #5 Right Calcaneus: Kerlix/Rolled Gauze Dry Gauze Heel Cup Wound #6 Right,Distal,Lateral Foot: Kerlix/Rolled Gauze Dry Gauze Heel Cup Wound #7 Right,Proximal,Lateral Foot: Kerlix/Rolled Gauze Dry Gauze Heel Cup Off-Loading: Open toe surgical shoe to: - right foot 1. I continue with silver collagen 2. I could consider a trial of Iodoflex on these areas although I think all of these are ischemic and I am doubtful that there is enough blood flow to support healing 3. I reviewed Dr. Mee Hives note he did not feel he was even a candidate for an angiogram thinking that he had inflow disease that he would not be a candidate for a bypass because of cardiopulmonary issues. His comment was the only thing that we could offer him was BKA or an amputation. I did discuss this with the patient and his wife today. He had does have's claudication at night but I think he is very limited activity during the day and he seems reasonably comfortable. It would be difficult to suggest that he needs an amputation currently however I told him that if the pain becomes more severe or unmanageable then that something he is going to have to decide Electronic Signature(s) Signed: 05/30/2019 9:36:08 AM By: [name] RN Signed: 05/30/2019 6:00:23 PM By: [name] MD Previous Signature: 05/29/2019 12:52:55 PM Version By: [name] MD Entered By: [name] on 05/30/2019 09:26:36 -------------------------------------------------------------------------------- SuperBill Details Patient Name: Date of Service: DO RN, [name] 05/27/2019 Medical Record Number: [id number] Patient Account Number: [id number] Date of Birth/Sex: Treating RN: 04-23-1929 (86 y.o. [name]) Dolores Lory, [place] Primary Care Provider: Piedad Climes, CHA RLES Other Clinician: Referring Provider: Treating Provider/Extender: Mare Ferrari, CHA RLES Weeks in Treatment: 5 Diagnosis Coding ICD-10 Codes Code Description [phone] Atherosclerosis of native arteries of right leg with ulceration of other part of foot L97.511 Non-pressure chronic ulcer of other part of right foot limited to breakdown of skin I87.323 Chronic venous hypertension (idiopathic) with inflammation of bilateral lower extremity Facility Procedures CPT4 Code: [id number] Description: 99214 - WOUND CARE VISIT-LEV 4 EST PT Modifier: Quantity: 1 CPT4 Code: [id number] Description: [id number] - DEBRIDE W/O ANES NON SELECT Modifier: Quantity: 1 Physician Procedures : CPT4 Code Description Modifier [id number] - WC PHYS LEVEL 3 - EST PT ICD-10 Diagnosis Description I70.235 Atherosclerosis of native arteries of right leg with ulceration of other part of foot L97.511 Non-pressure chronic ulcer of other part of right  foot limited to breakdown of skin Quantity: 1 Electronic Signature(s) Signed: 05/30/2019 9:36:08 AM By: [name] RN Signed: 05/30/2019 6:00:23 PM By: [name] MD Previous Signature: 05/29/2019 12:52:55 PM Version By: [name] MD Previous Signature: 05/27/2019 5:13:38 PM Version By: [name] RN Entered By: [name] on 05/30/2019 09:26:15

## 2019-05-29 NOTE — Patient Outreach (Signed)
[facility]) Care Management  05/29/2019  [name] 27-Oct-1929 [id number]   Referral received from care management assistant, request for skilled nursing info.  Upon chart review, noted that member was active with [facility] previously but case was closed due to engagement with hospice/palliaitve care.  Call placed to [facility] to verify activity, [name] confirms that member is active with palliative care, was last seen on 5/6.  She also report that member recently had wounds on the right foot that were being treated.  [name] was ordered for wound care but wife reportedly refused their services stating member would be going to the wound clinic.  It is not certain if she understood that member could be seen at the clinic as well as have visiting nurses.  [name] is unsure of any ongoing issues with member but will reach out to assigned RN, [name], to follow up with wife.  She will also have RN reach out to this care Freight forwarder for collaboration.  This care manager will follow up with member/wife within the next 3 business days for screening/assessment.  [name], [place], MSN [place] [phone]

## 2019-05-30 NOTE — Progress Notes (Signed)
Sivley, Micha [name] ([id number]) Visit Report for 05/27/2019 Arrival Information Details Patient Name: Date of Service: DO RN, [place] V ID L. 05/27/2019 2:00 PM Medical Record Number: [id number] Patient Account Number: [id number] Date of Birth/Sex: Treating RN: August 05, 1929 (86 y.o. [name] Primary Care Jahmarion Popoff: Piedad Climes, CHA RLES Other Clinician: Referring Anique Beckley: Treating Kaniah Rizzolo/Extender: Mare Ferrari, CHA RLES Weeks in Treatment: 5 Visit Information History Since Last Visit Added or deleted any medications: No Patient Arrived: Wheel Chair Any new allergies or adverse reactions: No Arrival Time: 14:51 Had a fall or experienced change in No Accompanied By: wife activities of daily living that may affect Transfer Assistance: None risk of falls: Patient Identification Verified: Yes Signs or symptoms of abuse/neglect since last visito No Secondary Verification Process Completed: Yes Hospitalized since last visit: No Patient Has Alerts: Yes Implantable device outside of the clinic excluding No Patient Alerts: Right ABI: Floyd, TBI: 0 cellular tissue based products placed in the center Left ABI: Joanna, TBI:0 since last visit: Has Dressing in Place as Prescribed: Yes Pain Present Now: Yes Electronic Signature(s) Signed: 05/28/2019 12:46:48 PM By: Levan Hurst RN, BSN Entered By: Levan Hurst on 05/27/2019 14:51:45 -------------------------------------------------------------------------------- Clinic Level of Care Assessment Details Patient Name: Date of Service: DO RN, [name] 05/27/2019 2:00 PM Medical Record Number: [id number] Patient Account Number: [id number] Date of Birth/Sex: Treating RN: 04/05/1929 (86 y.o. [name]) [name] Primary Care Cielle Aguila: Piedad Climes, CHA RLES Other Clinician: Referring Rosangelica Pevehouse: Treating Canyon Willow/Extender: Mare Ferrari, CHA RLES Weeks in Treatment: 5 Clinic Level of Care Assessment Items TOOL 4 Quantity Score X- 1 0 Use when only  an EandM is performed on FOLLOW-UP visit ASSESSMENTS - Nursing Assessment / Reassessment X- 1 10 Reassessment of Co-morbidities (includes updates in patient status) X- 1 5 Reassessment of Adherence to Treatment Plan ASSESSMENTS - Wound and Skin A ssessment / Reassessment []  - 0 Simple Wound Assessment / Reassessment - one wound X- 5 5 Complex Wound Assessment / Reassessment - multiple wounds []  - 0 Dermatologic / Skin Assessment (not related to wound area) ASSESSMENTS - Focused Assessment []  - 0 Circumferential Edema Measurements - multi extremities []  - 0 Nutritional Assessment / Counseling / Intervention []  - 0 Lower Extremity Assessment (monofilament, tuning fork, pulses) []  - 0 Peripheral Arterial Disease Assessment (using hand held doppler) ASSESSMENTS - Ostomy and/or Continence Assessment and Care []  - 0 Incontinence Assessment and Management []  - 0 Ostomy Care Assessment and Management (repouching, etc.) PROCESS - Coordination of Care X - Simple Patient / Family Education for ongoing care 1 15 []  - 0 Complex (extensive) Patient / Family Education for ongoing care X- 1 10 Staff obtains Programmer, systems, Records, T Results / Process Orders est []  - 0 Staff telephones HHA, Nursing Homes / Clarify orders / etc []  - 0 Routine Transfer to another Facility (non-emergent condition) []  - 0 Routine Hospital Admission (non-emergent condition) []  - 0 New Admissions / Biomedical engineer / Ordering NPWT Apligraf, etc. , []  - 0 Emergency Hospital Admission (emergent condition) X- 1 10 Simple Discharge Coordination []  - 0 Complex (extensive) Discharge Coordination PROCESS - Special Needs []  - 0 Pediatric / Minor Patient Management []  - 0 Isolation Patient Management []  - 0 Hearing / Language / Visual special needs []  - 0 Assessment of Community assistance (transportation, D/C planning, etc.) []  - 0 Additional assistance / Altered mentation []  - 0 Support Surface(s)  Assessment (bed, cushion, seat, etc.) INTERVENTIONS - Wound Cleansing / Measurement []  -  0 Simple Wound Cleansing - one wound X- 5 5 Complex Wound Cleansing - multiple wounds X- 1 5 Wound Imaging (photographs - any number of wounds) []  - 0 Wound Tracing (instead of photographs) []  - 0 Simple Wound Measurement - one wound X- 5 5 Complex Wound Measurement - multiple wounds INTERVENTIONS - Wound Dressings []  - 0 Small Wound Dressing one or multiple wounds []  - 0 Medium Wound Dressing one or multiple wounds X- 1 20 Large Wound Dressing one or multiple wounds []  - 0 Application of Medications - topical []  - 0 Application of Medications - injection INTERVENTIONS - Miscellaneous []  - 0 External ear exam []  - 0 Specimen Collection (cultures, biopsies, blood, body fluids, etc.) []  - 0 Specimen(s) / Culture(s) sent or taken to Lab for analysis []  - 0 Patient Transfer (multiple staff / Civil Service fast streamer / Similar devices) []  - 0 Simple Staple / Suture removal (25 or less) []  - 0 Complex Staple / Suture removal (26 or more) []  - 0 Hypo / Hyperglycemic Management (close monitor of Blood Glucose) []  - 0 Ankle / Brachial Index (ABI) - do not check if billed separately X- 1 5 Vital Signs Has the patient been seen at the hospital within the last three years: Yes Total Score: 155 Level Of Care: New/Established - Level 4 Electronic Signature(s) Signed: 05/27/2019 5:13:38 PM By: [name] RN Entered By: [name] on 05/27/2019 15:57:59 -------------------------------------------------------------------------------- Encounter Discharge Information Details Patient Name: Date of Service: DO RN, [name] 05/27/2019 2:00 PM Medical Record Number: [id number] Patient Account Number: [id number] Date of Birth/Sex: Treating RN: 02/22/1929 (86 y.o. [name] Primary Care Basil Buffin: Piedad Climes, CHA RLES Other Clinician: Referring Shelie Lansing: Treating Tarron Krolak/Extender: Mare Ferrari, CHA RLES Weeks in Treatment: 5 Encounter Discharge Information Items Discharge Condition: Stable Ambulatory Status: Wheelchair Discharge Destination: Home Transportation: Private Auto Accompanied By: wife Schedule Follow-up Appointment: Yes Clinical Summary of Care: Patient Declined Electronic Signature(s) Signed: 05/27/2019 5:47:21 PM By: Kela Millin Entered By: Kela Millin on 05/27/2019 16:20:34 -------------------------------------------------------------------------------- Lower Extremity Assessment Details Patient Name: Date of Service: DO RN, [name] 05/27/2019 2:00 PM Medical Record Number: [id number] Patient Account Number: [id number] Date of Birth/Sex: Treating RN: 1929/12/02 (85 y.o. [name] Primary Care Johnedward Brodrick: Piedad Climes, CHA RLES Other Clinician: Referring Reha Martinovich: Treating Fredia Chittenden/Extender: Mare Ferrari, CHA RLES Weeks in Treatment: 5 Edema Assessment Assessed: [Left: No] [Right: Yes] Edema: [Left: Ye] [Right: s] Calf Left: Right: Point of Measurement: cm From Medial Instep cm 35 cm Ankle Left: Right: Point of Measurement: cm From Medial Instep cm 25 cm Electronic Signature(s) Signed: 05/28/2019 12:46:48 PM By: Levan Hurst RN, BSN Entered By: Levan Hurst on 05/27/2019 14:53:53 -------------------------------------------------------------------------------- Multi Wound Chart Details Patient Name: Date of Service: DO RN, [name] 05/27/2019 2:00 PM Medical Record Number: [id number] Patient Account Number: [id number] Date of Birth/Sex: Treating RN: Jan 25, 1929 (86 y.o. [name]) [name] Primary Care Rahmir Beever: Piedad Climes, CHA RLES Other Clinician: Referring Marvina Danner: Treating Beyonka Pitney/Extender: Mare Ferrari, CHA RLES Weeks in Treatment: 5 Vital Signs Height(in): 69 Pulse(bpm): 41 Weight(lbs): 132 Blood Pressure(mmHg): 153/66 Body Mass Index(BMI): 19 Temperature(F): 98.4 Respiratory Rate(breaths/min):  18 Photos: [2:No Photos Right T Great oe] [4:No Photos Right, Medial T Great oe] [5:No Photos Right Calcaneus] Wound Location: [2:Gradually Appeared] [4:Gradually Appeared] [5:Gradually Appeared] Wounding Event: [2:Arterial Insufficiency Ulcer] [4:Arterial Insufficiency Ulcer] [5:Arterial Insufficiency Ulcer] Primary Etiology: [2:Cataracts, Asthma, Chronic] [4:Cataracts, Asthma, Chronic] [5:Cataracts, Asthma, Chronic] Comorbid History: [  2:Obstructive Pulmonary Disease (COPD), Congestive Heart Failure, Coronary Artery Disease, Hypertension, Peripheral Arterial Disease, Osteoarthritis, Confinement Anxiety 03/31/2019] [4:Obstructive Pulmonary Disease (COPD), Congestive  Heart Failure, Coronary Artery Disease, Hypertension, Peripheral Arterial Disease, Osteoarthritis, Confinement Anxiety 05/13/2019] [5:Obstructive Pulmonary Disease (COPD), Congestive Heart Failure, Coronary Artery Disease, Hypertension, Peripheral Arterial  Disease, Osteoarthritis, Confinement Anxiety 05/23/2019] Date Acquired: [2:5] [4:2] [5:0] Weeks of Treatment: [2:Open] [4:Open] [5:Open] Wound Status: [2:0.5x0.8x0.1] [4:2x1.4x0.1] [5:1x0.3x0.2] Measurements L x W x D (cm) [2:0.314] [4:2.199] [5:0.236] A (cm) : rea [2:0.031] [4:0.22] [5:0.047] Volume (cm) : [2:75.80%] [4:-42.90%] [5:N/A] % Reduction in Area: [2:76.20%] [4:-42.90%] [5:N/A] % Reduction in Volume: [2:Full Thickness Without Exposed] [4:Full Thickness Without Exposed] [5:Full Thickness Without Exposed] Classification: [2:Support Structures Small] [4:Support Structures Medium] [5:Support Structures Medium] Exudate Amount: [2:Serosanguineous] [4:Serous] [5:Serosanguineous] Exudate Type: [2:red, brown] [4:amber] [5:red, brown] Exudate Color: [2:Flat and Intact] [4:Flat and Intact] [5:Flat and Intact] Wound Margin: [2:Medium (34-66%)] [4:None Present (0%)] [5:Large (67-100%)] Granulation Amount: [2:N/A] [4:N/A] [5:Pink] Granulation Quality: [2:Medium (34-66%)] [4:Large  (67-100%)] [5:None Present (0%)] Necrotic Amount: [2:Fat Layer (Subcutaneous Tissue)] [4:Fat Layer (Subcutaneous Tissue)] [5:Fat Layer (Subcutaneous Tissue)] Exposed Structures: [2:Exposed: Yes Fascia: No Tendon: No Muscle: No Joint: No Bone: No Medium (34-66%)] [4:Exposed: Yes Fascia: No Tendon: No Muscle: No Joint: No Bone: No Small (1-33%)] [5:Exposed: Yes Fascia: No Tendon: No Muscle: No Joint: No Bone: No None] Wound Number: 6 7 N/A Photos: No Photos No Photos N/A Right, Distal, Lateral Foot Right, Proximal, Lateral Foot N/A Wound Location: Gradually Appeared Gradually Appeared N/A Wounding Event: Arterial Insufficiency Ulcer Arterial Insufficiency Ulcer N/A Primary Etiology: Cataracts, Asthma, Chronic Cataracts, Asthma, Chronic N/A Comorbid History: Obstructive Pulmonary Disease Obstructive Pulmonary Disease (COPD), Congestive Heart Failure, (COPD), Congestive Heart Failure, Coronary Artery Disease, Coronary Artery Disease, Hypertension, Peripheral Arterial Hypertension, Peripheral Arterial Disease, Osteoarthritis, Confinement Disease, Osteoarthritis, Confinement Anxiety Anxiety 05/23/2019 05/23/2019 N/A Date Acquired: 0 0 N/A Weeks of Treatment: Open Open N/A Wound Status: 1x0.4x0.1 0.7x0.1x0.1 N/A Measurements L x W x D (cm) 0.314 0.055 N/A A (cm) : rea 0.031 0.005 N/A Volume (cm) : N/A N/A N/A % Reduction in A rea: N/A N/A N/A % Reduction in Volume: Unclassifiable Unclassifiable N/A Classification: Medium Medium N/A Exudate A mount: Serosanguineous Serosanguineous N/A Exudate Type: red, brown red, brown N/A Exudate Color: Flat and Intact Flat and Intact N/A Wound Margin: None Present (0%) None Present (0%) N/A Granulation A mount: N/A N/A N/A Granulation Quality: Large (67-100%) Large (67-100%) N/A Necrotic A mount: Fascia: No Fascia: No N/A Exposed Structures: Fat Layer (Subcutaneous Tissue) Fat Layer (Subcutaneous Tissue) Exposed: No Exposed:  No Tendon: No Tendon: No Muscle: No Muscle: No Joint: No Joint: No Bone: No Bone: No None None N/A Epithelialization: Treatment Notes Wound #2 (Right Toe Great) 1. Cleanse With Wound Cleanser 2. Periwound Care TCA Cream 3. Primary Dressing Applied Collegen AG 4. Secondary Dressing Dry Gauze Roll Gauze Foam Heel Cup 5. Secured With Tape Notes netting Wound #4 (Right, Medial Toe Great) 1. Cleanse With Wound Cleanser 2. Periwound Care TCA Cream 3. Primary Dressing Applied [employer] 4. Secondary Dressing Dry Gauze Roll Gauze Foam Heel Cup 5. Secured With Tape Notes netting Wound #5 (Right Calcaneus) 1. Cleanse With Wound Cleanser 2. Periwound Care TCA Cream 3. Primary Dressing Applied [employer] 4. Secondary Dressing Dry Gauze Roll Gauze Foam Heel Cup 5. Secured With Tape Notes netting Wound #6 (Right, Distal, Lateral Foot) 1. Cleanse With Wound Cleanser 2. Periwound Care TCA Cream 3. Primary Dressing Applied [employer] 4. Secondary Dressing Dry  Gauze Roll Gauze Foam Heel Cup 5. Secured With Tape Notes netting Wound #7 (Right, Proximal, Lateral Foot) 1. Cleanse With Wound Cleanser 2. Periwound Care TCA Cream 3. Primary Dressing Applied [employer] 4. Secondary Dressing Dry Gauze Roll Gauze Foam Heel Cup 5. Secured With Tape Notes Horticulturist, commercial) Signed: 05/29/2019 12:52:55 PM By: [name] MD Signed: 05/30/2019 9:36:08 AM By: [name] RN Entered By: [name] on 05/27/2019 17:50:18 -------------------------------------------------------------------------------- Multi-Disciplinary Care Plan Details Patient Name: Date of Service: DO RN, [name] 05/27/2019 2:00 PM Medical Record Number: [id number] Patient Account Number: [id number] Date of Birth/Sex: Treating RN: 28-Jan-1929 (86 y.o. [name]) [name] Primary Care Ami Thornsberry: Piedad Climes, CHA RLES Other Clinician: Referring Jubilee Vivero: Treating  Nadalee Neiswender/Extender: Mare Ferrari, CHA RLES Weeks in Treatment: 5 Active Inactive Wound/Skin Impairment Nursing Diagnoses: Impaired tissue integrity Knowledge deficit related to ulceration/compromised skin integrity Goals: Patient/caregiver will verbalize understanding of skin care regimen Date Initiated: 04/21/2019 Target Resolution Date: 06/23/2019 Goal Status: Active Ulcer/skin breakdown will have a volume reduction of 30% by week 4 Date Initiated: 04/21/2019 Date Inactivated: 05/27/2019 Target Resolution Date: 05/23/2019 Goal Status: Met Ulcer/skin breakdown will have a volume reduction of 50% by week 8 Date Initiated: 05/27/2019 Target Resolution Date: 06/23/2019 Goal Status: Active Interventions: Assess patient/caregiver ability to obtain necessary supplies Assess patient/caregiver ability to perform ulcer/skin care regimen upon admission and as needed Assess ulceration(s) every visit Provide education on ulcer and skin care Notes: Electronic Signature(s) Signed: 05/27/2019 5:13:38 PM By: [name] RN Entered By: [name] on 05/27/2019 15:31:50 -------------------------------------------------------------------------------- Pain Assessment Details Patient Name: Date of Service: DO RN, [name] 05/27/2019 2:00 PM Medical Record Number: [id number] Patient Account Number: [id number] Date of Birth/Sex: Treating RN: 1929-02-07 (86 y.o. [name] Primary Care Savyon Loken: Piedad Climes, CHA RLES Other Clinician: Referring Ova Meegan: Treating Vora Clover/Extender: Mare Ferrari, CHA RLES Weeks in Treatment: 5 Active Problems Location of Pain Severity and Description of Pain Patient Has Paino Yes Site Locations Pain Location: Pain in Ulcers With Dressing Change: Yes Duration of the Pain. Constant / Intermittento Intermittent Rate the pain. Current Pain Level: 6 Character of Pain Describe the Pain: Throbbing Pain Management and Medication Current Pain  Management: Medication: Yes Cold Application: No Rest: No Massage: No Activity: No T.E.N.S.: No Heat Application: No Leg drop or elevation: No Is the Current Pain Management Adequate: Adequate How does your wound impact your activities of daily livingo Sleep: No Bathing: No Appetite: No Relationship With Others: No Bladder Continence: No Emotions: No Bowel Continence: No Work: No Toileting: No Drive: No Dressing: No Hobbies: No Electronic Signature(s) Signed: 05/28/2019 12:46:48 PM By: Levan Hurst RN, BSN Entered By: Levan Hurst on 05/27/2019 14:52:22 -------------------------------------------------------------------------------- Patient/Caregiver Education Details Patient Name: Date of Service: DO RN, [name] ID [name] 5/18/2021andnbsp2:00 PM Medical Record Number: [id number] Patient Account Number: [id number] Date of Birth/Gender: Treating RN: [birth year] (86 y.o. [name]) [name] Primary Care Physician: Piedad Climes, CHA RLES Other Clinician: Referring Physician: Treating Physician/Extender: Mare Ferrari, CHA RLES Weeks in Treatment: 5 Education Assessment Education Provided To: Patient Education Topics Provided Wound/Skin Impairment: Methods: Explain/Verbal Responses: State content correctly Electronic Signature(s) Signed: 05/27/2019 5:13:38 PM By: [name] RN Entered By: [name] on 05/27/2019 15:32:18 -------------------------------------------------------------------------------- Wound Assessment Details Patient Name: Date of Service: DO RN, [name] 05/27/2019 2:00 PM Medical Record Number: [id number] Patient Account Number: [id number] Date of Birth/Sex: Treating RN: 10/01/1929 (86 y.o. [name] Primary Care  Shalva Rozycki: Piedad Climes, CHA RLES Other Clinician: Referring Arick Mareno: Treating Sharah Finnell/Extender: Mare Ferrari, CHA RLES Weeks in Treatment: 5 Wound Status Wound Number: 2 Primary Arterial Insufficiency  Ulcer Etiology: Wound Location: Right T Great oe Wound Open Wounding Event: Gradually Appeared Status: Date Acquired: 03/31/2019 Comorbid Cataracts, Asthma, Chronic Obstructive Pulmonary Disease Weeks Of Treatment: 5 Weeks Of Treatment: 5 History: (COPD), Congestive Heart Failure, Coronary Artery Disease, Clustered Wound: No Hypertension, Peripheral Arterial Disease, Osteoarthritis, Confinement Anxiety Wound Measurements Length: (cm) 0.5 Width: (cm) 0.8 Depth: (cm) 0.1 Area: (cm) 0.314 Volume: (cm) 0.031 % Reduction in Area: 75.8% % Reduction in Volume: 76.2% Epithelialization: Medium (34-66%) Tunneling: No Undermining: No Wound Description Classification: Full Thickness Without Exposed Support Structures Wound Margin: Flat and Intact Exudate Amount: Small Exudate Type: Serosanguineous Exudate Color: red, brown Foul Odor After Cleansing: No Slough/Fibrino Yes Wound Bed Granulation Amount: Medium (34-66%) Exposed Structure Necrotic Amount: Medium (34-66%) Fascia Exposed: No Necrotic Quality: Adherent Slough Fat Layer (Subcutaneous Tissue) Exposed: Yes Tendon Exposed: No Muscle Exposed: No Joint Exposed: No Bone Exposed: No Treatment Notes Wound #2 (Right Toe Great) 1. Cleanse With Wound Cleanser 2. Periwound Care TCA Cream 3. Primary Dressing Applied [employer] 4. Secondary Dressing Dry Gauze Roll Gauze Foam Heel Cup 5. Secured With Tape Notes Horticulturist, commercial) Signed: 05/28/2019 12:46:48 PM By: Levan Hurst RN, BSN Entered By: Levan Hurst on 05/27/2019 15:00:18 -------------------------------------------------------------------------------- Wound Assessment Details Patient Name: Date of Service: DO RN, [name] 05/27/2019 2:00 PM Medical Record Number: [id number] Patient Account Number: [id number] Date of Birth/Sex: Treating RN: [birth year] (86 y.o. [name] Primary Care Tamre Cass: Piedad Climes, CHA RLES Other  Clinician: Referring Fern Asmar: Treating Lennette Fader/Extender: Mare Ferrari, CHA RLES Weeks in Treatment: 5 Wound Status Wound Number: 4 Primary Arterial Insufficiency Ulcer Etiology: Wound Location: Right, Medial T Great oe Wound Open Wounding Event: Gradually Appeared Status: Status: Date Acquired: 05/13/2019 Comorbid Cataracts, Asthma, Chronic Obstructive Pulmonary Disease Weeks Of Treatment: 2 History: (COPD), Congestive Heart Failure, Coronary Artery Disease, Clustered Wound: No Hypertension, Peripheral Arterial Disease, Osteoarthritis, Confinement Anxiety Wound Measurements Length: (cm) 2 Width: (cm) 1.4 Depth: (cm) 0.1 Area: (cm) 2.199 Volume: (cm) 0.22 % Reduction in Area: -42.9% % Reduction in Volume: -42.9% Epithelialization: Small (1-33%) Tunneling: No Undermining: No Wound Description Classification: Full Thickness Without Exposed Support Structures Wound Margin: Flat and Intact Exudate Amount: Medium Exudate Type: Serous Exudate Color: amber Foul Odor After Cleansing: No Slough/Fibrino Yes Wound Bed Granulation Amount: None Present (0%) Exposed Structure Necrotic Amount: Large (67-100%) Fascia Exposed: No Necrotic Quality: Adherent Slough Fat Layer (Subcutaneous Tissue) Exposed: Yes Tendon Exposed: No Muscle Exposed: No Joint Exposed: No Bone Exposed: No Treatment Notes Wound #4 (Right, Medial Toe Great) 1. Cleanse With Wound Cleanser 2. Periwound Care TCA Cream 3. Primary Dressing Applied [employer]le[employer] 4. Secondary Dressing Dry Gauze Roll Gauze Foam Heel Cup 5. Secured With Tape Notes Horticulturist, commercial) Signed: 05/28/2019 12:46:48 PM By: Levan Hurst RN, BSN Entered By: Levan Hurst on 05/27/2019 15:00:31 -------------------------------------------------------------------------------- Wound Assessment Details Patient Name: Date of Service: DO RN, [name] 05/27/2019 2:00 PM Medical Record Number:  [id number] Patient Account Number: [id number] Date of Birth/Sex: Treating RN: [birth year] (86 y.o. [name] Primary Care Geroge Gilliam: Piedad Climes, CHA RLES Other Clinician: Referring Dyke Weible: Treating Talmage Teaster/Extender: Mare Ferrari, CHA RLES Weeks in Treatment: 5 Wound Status Wound Number: 5 Primary Arterial Insufficiency Ulcer Etiology: Wound Location: Right Calcaneus Wound Open Wounding Event: Gradually Appeared Status:  Date Acquired: 05/23/2019 Comorbid Cataracts, Asthma, Chronic Obstructive Pulmonary Disease Weeks Of Treatment: 0 History: (COPD), Congestive Heart Failure, Coronary Artery Disease, Clustered Wound: No Hypertension, Peripheral Arterial Disease, Osteoarthritis, Confinement Anxiety Wound Measurements Length: (cm) 1 Width: (cm) 0.3 Depth: (cm) 0.2 Area: (cm) 0.236 Volume: (cm) 0.047 % Reduction in Area: % Reduction in Volume: Epithelialization: None Tunneling: No Undermining: No Wound Description Classification: Full Thickness Without Exposed Support Structures Wound Margin: Flat and Intact Exudate Amount: Medium Exudate Type: Serosanguineous Exudate Color: red, brown Foul Odor After Cleansing: No Slough/Fibrino No Wound Bed Granulation Amount: Large (67-100%) Exposed Structure Granulation Quality: Pink Fascia Exposed: No Necrotic Amount: None Present (0%) Fat Layer (Subcutaneous Tissue) Exposed: Yes Tendon Exposed: No Muscle Exposed: No Joint Exposed: No Bone Exposed: No Treatment Notes Wound #5 (Right Calcaneus) 1. Cleanse With Wound Cleanser 2. Periwound Care TCA Cream 3. Primary Dressing Applied [employer] 4. Secondary Dressing Dry Gauze Roll Gauze Foam Heel Cup 5. Secured With Tape Notes Horticulturist, commercial) Signed: 05/28/2019 12:46:48 PM By: Levan Hurst RN, BSN Entered By: Levan Hurst on 05/27/2019 15:02:03 -------------------------------------------------------------------------------- Wound  Assessment Details Patient Name: Date of Service: DO RN, [name] 05/27/2019 2:00 PM Medical Record Number: [id number] Patient Account Number: [id number] Date of Birth/Sex: Treating RN: 1929-04-03 (86 y.o. [name] Primary Care Alandria Butkiewicz: Piedad Climes, CHA RLES Other Clinician: Referring Vannessa Godown: Treating Tayquan Gassman/Extender: Mare Ferrari, CHA RLES Weeks in Treatment: 5 Wound Status Wound Number: 6 Primary Arterial Insufficiency Ulcer Etiology: Wound Location: Right, Distal, Lateral Foot Wound Open Wounding Event: Gradually Appeared Status: Date Acquired: 05/23/2019 Comorbid Cataracts, Asthma, Chronic Obstructive Pulmonary Disease Weeks Of Treatment: 0 History: (COPD), Congestive Heart Failure, Coronary Artery Disease, Clustered Wound: No Hypertension, Peripheral Arterial Disease, Osteoarthritis, Confinement Anxiety Wound Measurements Length: (cm) 1 Width: (cm) 0.4 Depth: (cm) 0.1 Area: (cm) 0.314 Volume: (cm) 0.031 % Reduction in Area: % Reduction in Volume: Epithelialization: None Tunneling: No Undermining: No Wound Description Classification: Unclassifiable Wound Margin: Flat and Intact Exudate Amount: Medium Exudate Type: Serosanguineous Exudate Color: red, brown Foul Odor After Cleansing: No Slough/Fibrino Yes Wound Bed Granulation Amount: None Present (0%) Exposed Structure Necrotic Amount: Large (67-100%) Fascia Exposed: No Necrotic Quality: Adherent Slough Fat Layer (Subcutaneous Tissue) Exposed: No Tendon Exposed: No Muscle Exposed: No Joint Exposed: No Bone Exposed: No Treatment Notes Wound #6 (Right, Distal, Lateral Foot) 1. Cleanse With Wound Cleanser 2. Periwound Care TCA Cream 3. Primary Dressing Applied Collegen AG 4. Secondary Dressing Dry Gauze Roll Gauze Foam Heel Cup 5. Secured With Tape Notes Horticulturist, commercial) Signed: 05/28/2019 12:46:48 PM By: Levan Hurst RN, BSN Entered By: Levan Hurst on  05/27/2019 15:03:06 -------------------------------------------------------------------------------- Wound Assessment Details Patient Name: Date of Service: DO RN, [name] 05/27/2019 2:00 PM Medical Record Number: [id number] Patient Account Number: [id number] Date of Birth/Sex: Treating RN: 05/24/1929 (86 y.o. [name] Primary Care Santoria Chason: Piedad Climes, CHA RLES Other Clinician: Referring Neytiri Asche: Treating Kaila Devries/Extender: Mare Ferrari, CHA RLES Weeks in Treatment: 5 Wound Status Wound Number: 7 Primary Arterial Insufficiency Ulcer Etiology: Wound Location: Right, Proximal, Lateral Foot Wound Open Wounding Event: Gradually Appeared Status: Date Acquired: 05/23/2019 Comorbid Cataracts, Asthma, Chronic Obstructive Pulmonary Disease Weeks Of Treatment: 0 History: (COPD), Congestive Heart Failure, Coronary Artery Disease, Clustered Wound: No Hypertension, Peripheral Arterial Disease, Osteoarthritis, Confinement Anxiety Wound Measurements Length: (cm) 0.7 Width: (cm) 0.1 Depth: (cm) 0.1 Area: (cm) 0.055 Volume: (cm) 0.005 % Reduction in Area: % Reduction in Volume: Epithelialization: None Tunneling:  No Undermining: No Wound Description Classification: Unclassifiable Wound Margin: Flat and Intact Exudate Amount: Medium Exudate Type: Serosanguineous Exudate Color: red, brown Foul Odor After Cleansing: No Slough/Fibrino Yes Wound Bed Granulation Amount: None Present (0%) Exposed Structure Necrotic Amount: Large (67-100%) Fascia Exposed: No Necrotic Quality: Adherent Slough Fat Layer (Subcutaneous Tissue) Exposed: No Tendon Exposed: No Muscle Exposed: No Joint Exposed: No Bone Exposed: No Treatment Notes Wound #7 (Right, Proximal, Lateral Foot) 1. Cleanse With Wound Cleanser 2. Periwound Care TCA Cream 3. Primary Dressing Applied Collegen [employer] 4. Secondary Dressing Dry Gauze Roll Gauze Foam Heel Cup 5. Secured  With Tape Notes Horticulturist, commercial) Signed: 05/28/2019 12:46:48 PM By: Levan Hurst RN, BSN Entered By: Levan Hurst on 05/27/2019 15:04:13 -------------------------------------------------------------------------------- Vitals Details Patient Name: Date of Service: DO RN, [name] 05/27/2019 2:00 PM Medical Record Number: [id number] Patient Account Number: [id number] Date of Birth/Sex: Treating RN: 05/20/1929 (86 y.o. [name] Primary Care Kathryne Ramella: Piedad Climes, CHA RLES Other Clinician: Referring Faizah Kandler: Treating Mamye Bolds/Extender: Mare Ferrari, CHA RLES Weeks in Treatment: 5 Vital Signs Time Taken: 14:51 Temperature (F): 98.4 Height (in): 69 Pulse (bpm): 83 Weight (lbs): 132 Respiratory Rate (breaths/min): 18 Body Mass Index (BMI): 19.5 Blood Pressure (mmHg): 153/66 Reference Range: 80 - 120 mg / dl Electronic Signature(s) Signed: 05/28/2019 12:46:48 PM By: Levan Hurst RN, BSN Entered By: Levan Hurst on 05/27/2019 14:52:06

## 2019-06-02 ENCOUNTER — Other Ambulatory Visit: Payer: Self-pay | Admitting: *Deleted

## 2019-06-02 NOTE — Patient Outreach (Signed)
[facility]) Care Management  06/02/2019  TIFFANY PERRA 1929-03-13 [id number]   Referral Date: 5/19 Referral Source: Insurance Referral Reason: Skilled nursing resources Insurance: [place] attempt #1, successful to wife, member's identity verified.  This care manager introduced self and stated purpose of call.  [facility] care management services explained.   Social: Lives with wife, she report he is becoming more dependent on her for ADL's.  State he is "seriously ill" and she is in need of some assistance.  Report she received a letter in the mail from [facility] mentioning help with getting information on skilled nursing facilities that didn't require a 3 day hospital stay.  Discussed member's current condition, report he is active with palliative care and was given a poor prognosis from the cardiologist, pulmonologist, and vascular MD.  State she was told there was "nothing else they can do," she feel as if they are giving up on him.  Educated on disease process of COPD as well as PVD and heart disease.  Although she verbalizes understanding, she feels there is more that can be done.  He is now developing wounds on his right foot due to the PVD, being treated at the wound care center but she acknowledges they are not healing.  State an amputation was mentioned if the wounds continued to expand and not heal.  Discussed member's prognosis and if surgery would be ideal, she agrees that it wouldn't but also report they do not want to become active wit hospice yet.  State if he transitions to hospice, his COPD medications will be stopped.  She admits that she didn't want home health coming for wound care in between visits to the wound center but is now ready to reconsider as member continue to develop wounds during appointments.    Discussed her ability to adequately care for member in the home, state she is wanting to keep him in the home as long as possible.  She is wanting a list of  facilities so that she can review them and prepare for a future date.  They live in a tri-level home and she admits that it is becoming more difficult to care for him safely in the home.  This care manager discussed having [facility] come into the home for assessment and recommendations, she agrees.  Conditions: Per chart, history of HTN, CHF, CAD, CHF, PVD, COPD, GERD, BPH, Chronic sinusitis, and HLD.  Medications: Reviewed with wife, she report he is still able to manage them himself by using a pill box.  Denies any problems affording them or questions regarding management.    Appointments: Active with Dr. Harrington Challenger as PCP, she will look to see when his next appointment is.  Appointments with wound care center on 6/1, with vascular MD on 7/7, and with pulmonology on 8/10.     Plan: RN CM will place referral to [facility] for in home assessment of needs and verify member is at the level of care needed. RN CM will follow up with member within the next 2 weeks.  Fall Risk  06/02/2019 03/08/2017 10/02/2016 10/06/2015 12/26/2012  Falls in the past year? 0 No No Yes No  Number falls in past yr: 0 - - 2 or more -  Injury with Fall? 0 - - Yes -  Risk Factor Category  - - - High Fall Risk -  Risk for fall due to : - - - Impaired balance/gait;Impaired mobility;History of fall(s) Impaired balance/gait;Medication side effect  Follow up - - - Education provided;Falls prevention discussed -   [facility] CM Care Plan Problem One     Most Recent Value  Care Plan Problem One  Risk for infection related to open wounds  Role Documenting the Problem One  Care Management [place] for Problem One  Active  [facility] Long Term Goal   Wife will report no signs of infection within the next 31 days  [facility] Long Term Goal Start Date  06/02/19  Interventions for Problem One Long Term Goal  Wife educated on importance of clean wound care.  Educated on signs/symptoms of infection  [facility] CM Short Term Goal #1   Wife will  report in home assessment complete with [facility] within the next 2 weeks  [facility] CM Short Term Goal #1 Start Date  06/02/19  Interventions for Short Term Goal #1  Referral placed to [facility].  Wife educated on program  [facility] CM Short Term Goal #2   Wife will be able to verbalize long term plan within the next 2 weeks  [facility] CM Short Term Goal #2 Start Date  06/02/19  Interventions for Short Term Goal #2  Mailed list of SNF and encouraged to reconsider hospice     Valente Octavian, RN, MSN [place] Manager [phone]

## 2019-06-03 ENCOUNTER — Telehealth: Payer: Self-pay | Admitting: Podiatry

## 2019-06-03 ENCOUNTER — Encounter: Payer: Self-pay | Admitting: *Deleted

## 2019-06-03 NOTE — Telephone Encounter (Signed)
pts wife left message stating they are interested in getting diabetic shoes and she seen we do that.  Is it ok for me to get pt in for shoes as nothing was noted in your office note about diabetic shoes/inserts?

## 2019-06-05 ENCOUNTER — Other Ambulatory Visit: Payer: Self-pay

## 2019-06-05 MED ORDER — NITROGLYCERIN 0.4 MG SL SUBL: 0.4000 mg | SUBLINGUAL_TABLET | SUBLINGUAL | 6 refills | Status: DC | PRN

## 2019-06-06 ENCOUNTER — Other Ambulatory Visit: Payer: Self-pay | Admitting: Internal Medicine

## 2019-06-06 MED ORDER — SYMBICORT 160-4.5 MCG/ACT IN AERO: 2.0000 | INHALATION_SPRAY | Freq: Two times a day (BID) | RESPIRATORY_TRACT | 5 refills | Status: DC

## 2019-06-10 ENCOUNTER — Encounter (HOSPITAL_BASED_OUTPATIENT_CLINIC_OR_DEPARTMENT_OTHER): Payer: Medicare Other | Attending: Internal Medicine | Admitting: Internal Medicine

## 2019-06-10 DIAGNOSIS — I5040 Unspecified combined systolic (congestive) and diastolic (congestive) heart failure: Secondary | ICD-10-CM | POA: Diagnosis not present

## 2019-06-10 DIAGNOSIS — J449 Chronic obstructive pulmonary disease, unspecified: Secondary | ICD-10-CM | POA: Diagnosis not present

## 2019-06-10 DIAGNOSIS — Z9981 Dependence on supplemental oxygen: Secondary | ICD-10-CM | POA: Insufficient documentation

## 2019-06-10 DIAGNOSIS — X58XXXA Exposure to other specified factors, initial encounter: Secondary | ICD-10-CM | POA: Insufficient documentation

## 2019-06-10 DIAGNOSIS — I70235 Atherosclerosis of native arteries of right leg with ulceration of other part of foot: Secondary | ICD-10-CM | POA: Diagnosis not present

## 2019-06-10 DIAGNOSIS — S51002A Unspecified open wound of left elbow, initial encounter: Secondary | ICD-10-CM | POA: Diagnosis not present

## 2019-06-10 DIAGNOSIS — I2510 Atherosclerotic heart disease of native coronary artery without angina pectoris: Secondary | ICD-10-CM | POA: Diagnosis not present

## 2019-06-10 DIAGNOSIS — I110 Hypertensive heart disease with heart failure: Secondary | ICD-10-CM | POA: Insufficient documentation

## 2019-06-10 DIAGNOSIS — L97511 Non-pressure chronic ulcer of other part of right foot limited to breakdown of skin: Secondary | ICD-10-CM | POA: Diagnosis not present

## 2019-06-10 DIAGNOSIS — M1990 Unspecified osteoarthritis, unspecified site: Secondary | ICD-10-CM | POA: Insufficient documentation

## 2019-06-10 DIAGNOSIS — I87323 Chronic venous hypertension (idiopathic) with inflammation of bilateral lower extremity: Secondary | ICD-10-CM | POA: Diagnosis not present

## 2019-06-10 DIAGNOSIS — S51801A Unspecified open wound of right forearm, initial encounter: Secondary | ICD-10-CM | POA: Diagnosis not present

## 2019-06-10 DIAGNOSIS — L97519 Non-pressure chronic ulcer of other part of right foot with unspecified severity: Secondary | ICD-10-CM | POA: Diagnosis present

## 2019-06-10 DIAGNOSIS — L97512 Non-pressure chronic ulcer of other part of right foot with fat layer exposed: Secondary | ICD-10-CM | POA: Diagnosis not present

## 2019-06-11 NOTE — Progress Notes (Signed)
Gantt, [name] ([id number]) Visit Report for 06/10/2019 Debridement Details Patient Name: Date of Service: DO RN, [name] 06/10/2019 2:45 PM Medical Record Number: [id number] Patient Account Number: [id number] Date of Birth/Sex: Treating RN: [birth year] (86 y.o. [name]) [name] Primary Care Provider: Piedad Climes, CHA RLES Other Clinician: Referring Provider: Treating Provider/Extender: Mare Ferrari, CHA RLES Weeks in Treatment: 7 Debridement Performed for Assessment: Wound #6 Right,Distal,Lateral Foot Performed By: Physician Ricard Dillon., MD Debridement Type: Debridement Severity of Tissue Pre Debridement: Fat layer exposed Level of Consciousness (Pre-procedure): Awake and Alert Pre-procedure Verification/Time Out Yes - 16:21 Taken: Start Time: 16:21 Pain Control: Lidocaine 5% topical ointment T Area Debrided (L x W): otal 1.1 (cm) x 0.6 (cm) = 0.66 (cm) Tissue and other material debrided: Viable, Non-Viable, Slough, Subcutaneous, Skin: Dermis , Skin: Epidermis, Slough Level: Skin/Subcutaneous Tissue Debridement Description: Excisional Instrument: Curette Bleeding: Minimum Hemostasis Achieved: Pressure End Time: 16:23 Procedural Pain: 0 Post Procedural Pain: 0 Response to Treatment: Procedure was tolerated well Level of Consciousness (Post- Awake and Alert procedure): Post Debridement Measurements of Total Wound Length: (cm) 1.1 Width: (cm) 0.6 Depth: (cm) 0.1 Volume: (cm) 0.052 Character of Wound/Ulcer Post Debridement: Improved Severity of Tissue Post Debridement: Fat layer exposed Post Procedure Diagnosis Same as Pre-procedure Electronic Signature(s) Signed: 06/10/2019 5:52:05 PM By: [name] MD Signed: 06/11/2019 4:36:43 PM By: [name] RN Entered By: [name] on 06/10/2019 17:16:58 -------------------------------------------------------------------------------- HPI Details Patient Name: Date of Service: DO RN, [name] 06/10/2019 2:45  PM Medical Record Number: [id number] Patient Account Number: [id number] Date of Birth/Sex: Treating RN: 1929/06/26 (86 y.o. [name]) [name] Primary Care Provider: Piedad Climes, CHA RLES Other Clinician: Referring Provider: Treating Provider/Extender: Mare Ferrari, CHA RLES Weeks in Treatment: 7 History of Present Illness HPI Description: 01/30/18 on evaluation today patient presents for initial inspection concerning the skin tear of the right forearm. Fortunately there does not appear to be any evidence of infection and this occurred approximately three days ago. He has been using Vaseline over the region. With that being said the patient does have a history of cataracts COPD, hypertension, peripheral arterial disease, and osteoarthritis. He tells me that he does have a little bit of pain at the site but nothing too significant at this time which is good news. Fortunately this overall appears to be doing fairly well which is good news. No fevers, chills, nausea, or vomiting noted at this time. The biggest issue at this point is that the wound is of quite significant size. READMISSION 04/21/2019 This is a 86 year old man with multiple medical problems who was seen once in the clinic here in January 2020 with a skin tear on his right forearm seen by [name]. This problem this time started sometime recently. He was noted by podiatry to have erosions and wounds of his right foot on toes 1-3. He also saw [name] of dermatology who gave him Silvadene cream for what I think was felt to be a stasis dermatitis of his bilateral lower legs. At some point he was referred to [name]. He was also noted to have previous noninvasive arterial studies that showed no flow to either one of his toes and noncompressible ABIs bilaterally. [name] did noninvasive arterial studies on him. These showed that he had greater than 75% stenosis in the proximal and mid common femoral artery. Monophasic flow in the  posterior tibial and dorsalis pedis positions on the right foot. ABIs again  were noncompressible. He was felt to have total occlusion of the superficial femoral artery on the right. The overall interpretation is that he had severe multilevel arterial occlusive disease. Absent femoral pulses and superficial femoral artery occlusions. In spite of this he was not felt to be candidate for arteriography given his bilateral femoral artery occlusions as well he would not be a candidate for endovascular approach secondary to his common femoral artery occlusions and proximal iliac disease. He was not felt to be a candidate for an open infra inguinal bypass. He therefore felt he would need to be monitored over time with the only option being a right below-knee amputation The patient complains currently of pain at night when he is up in bed. The pain in the right foot is better when he puts the leg down over the bed side. This is compatible with rest pain. He has very limited activity i.e. is even exhausted getting dressed in the morning because of cardiopulmonary issues therefore it is difficult to gauge claudication I believe he was referred here by Dr. [name] with one of the major issues is whether he would be a candidate for hyperbaric oxygen. He is not a diabetic. Dr. [name] did give him Silvadene cream for dry flaking skin in his bilateral lower extremities, however the patient is concerned about using this in the face of sulfa allergy. 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 This is on the medial aspect extended there is a second area on the plantar aspect. Most of what  the patient talks about is claudication with minimal activity [however the patient is limited by his COPD], or even at rest at night. He is not getting a lot of sleep. We use silver collagen on this wound 5/4; the patient has a wound on the tip of his right great toe also on the medial aspect. Setting of severe nonrevascularizable PAD [see description in HPI]. He does not describe any change in his pain. We have been using silver collagen 5/18; 2-week follow-up. The patient has wound on the tip of his right great toe as well as the medial aspect. He has developed new wounds x2 on the right lateral foot and the right lateral calcaneus. These look like ischemic areas. The patient talks about claudication at night. He seems to be more comfortable during the day. We have been using silver collagen to the wounds. I have previously debrided the eschar on the toes however this comes right back. 6/1; 2-week follow-up. This patient has ischemic wounds on his right foot in the setting of nonunreconstructable PAD [previously reviewed by Dr. Dixon]. He does not really describe severe pain but he does have episodic pain in the right great toe. He has an area on the tip of the right great toe as well as the medial aspect of the right great toe. A new area on the tip of the right fourth toe. He has the area on the right lateral foot fortunately the area on the right lateral heel appears to have epithelialized over. We are using silver collagen to all wounds Electronic Signature(s) Signed: 06/10/2019 5:52:05 PM By: [name] MD Entered By: [name] on 06/10/2019 17:18:33 -------------------------------------------------------------------------------- Physical  Exam Details Patient Name: Date of Service: DO RN, [name] 06/10/2019 2:45 PM Medical Record Number: [id number] Patient Account Number: [id number] Date of Birth/Sex: Treating RN: 04/01/1929 (86 y.o. [name]) [name] Primary Care Provider: Piedad Climes,  CHA RLES Other Clinician: Referring Provider: Treating Provider/Extender: Mare Ferrari, CHA RLES Weeks in Treatment: 7 Constitutional Patient is hypertensive.. Pulse regular and within target range for patient.Marland Kitchen Respirations regular, non-labored and within target range.. Temperature is normal and within the target range for the patient.Marland Kitchen Appears in no distress. Cardiovascular Pedal pulses absent on the right.. Notes Wound exam; wound is on the tip of the right great toe and the medial part of the toe. This has thick adherent eschar. I did not remove this. It does not seem to be any different. I.e. not any worse Small linear ischemic area on the right lateral foot I debrided with a #3 curette to remove adherent fibrinous debris. I will see if we can stimulating any healing or will this simply reform as it was prior to today's debridement. We will see next time we see him. The area on the lateral heel has healed somewhat surprising He has a small ischemic-looking spot on the right fourth toe tip I did not debride this either. I am going to watch this Electronic Signature(s) Signed: 06/10/2019 5:52:05 PM By: [name] MD Entered By: [name] on 06/10/2019 17:22:12 -------------------------------------------------------------------------------- Physician Orders Details Patient Name: Date of Service: DO RN, [name] 06/10/2019 2:45 PM Medical Record Number: [id number] Patient Account Number: [id number] Date of Birth/Sex: Treating RN: 04-12-1929 (86 y.o. [name]) [name] Primary Care Provider: Piedad Climes, CHA RLES Other Clinician: Referring Provider: Treating Provider/Extender: Mare Ferrari, CHA RLES Weeks in Treatment: 7 Verbal / Phone Orders: No Diagnosis Coding ICD-10 Coding Code Description I70.235 Atherosclerosis of native arteries of right leg with ulceration of other part of foot L97.511 Non-pressure chronic ulcer of other part of right foot limited to  breakdown of skin I87.323 Chronic venous hypertension (idiopathic) with inflammation of bilateral lower extremity Follow-up Appointments Return Appointment in 2 weeks. Dressing Change Frequency Wound #2 Right T Great oe Change Dressing every other day. Skin Barriers/Peri-Wound Care Moisturizing lotion - to both legs and feet daily TCA Cream or Ointment Wound Cleansing May shower and wash wound with soap and water. - on days that dressing is changed Primary Wound Dressing Wound #2 Right T Great oe Silver Collagen - moisten with hydrogel or KY jelly Wound #4 Right,Medial T Great oe Silver Collagen - moisten with hydrogel or KY jelly Wound #6 Right,Distal,Lateral Foot Silver Collagen - moisten with hydrogel or KY jelly Secondary Dressing Wound #2 Right T Great oe Foam Kerlix/Rolled Gauze Dry Gauze Wound #4 Right,Medial T Great oe Kerlix/Rolled Gauze Dry Gauze Heel Cup Wound #6 Right,Distal,Lateral Foot Kerlix/Rolled Gauze Dry Gauze Heel Cup Off-Loading Open toe surgical shoe to: - right foot Electronic Signature(s) Signed: 06/10/2019 5:52:05 PM By: [name] MD Signed: 06/11/2019 4:36:43 PM By: [name] RN Entered By: [name] on 06/10/2019 16:37:34 -------------------------------------------------------------------------------- Problem List Details Patient Name: Date of Service: DO RN, [name] 06/10/2019 2:45 PM Medical Record Number: [id number] Patient Account Number: [id number] Date of Birth/Sex: Treating RN: [birth year] (86 y.o. [name] Primary Care Provider: Piedad Climes, CHA RLES Other Clinician: Referring Provider: Treating Provider/Extender: Mare Ferrari, CHA RLES Weeks in Treatment: 7 Active Problems ICD-10 Encounter Code Description Active Date MDM Diagnosis I70.235 Atherosclerosis of native arteries  of right leg with ulceration of other part of 04/21/2019 No Yes foot L97.511 Non-pressure chronic ulcer of other part of right foot  limited to breakdown of 04/21/2019 No Yes skin I87.323 Chronic venous hypertension (idiopathic) with inflammation of bilateral lower 04/21/2019 No Yes extremity Inactive Problems Resolved Problems Electronic Signature(s) Signed: 06/10/2019 5:52:05 PM By: [name] MD Entered By: [name] on 06/10/2019 17:16:41 -------------------------------------------------------------------------------- Progress Note Details Patient Name: Date of Service: DO RN, [name] 06/10/2019 2:45 PM Medical Record Number: [id number] Patient Account Number: [id number] Date of Birth/Sex: Treating RN: Nov 03, 1929 (86 y.o. [name]) [name] Primary Care Provider: Piedad Climes, CHA RLES Other Clinician: Referring Provider: Treating Provider/Extender: Mare Ferrari, CHA RLES Weeks in Treatment: 7 Subjective History of Present Illness (HPI) 01/30/18 on evaluation today patient presents for initial inspection concerning the skin tear of the right forearm. Fortunately there does not appear to be any evidence of infection and this occurred approximately three days ago. He has been using Vaseline over the region. With that being said the patient does have a history of cataracts COPD, hypertension, peripheral arterial disease, and osteoarthritis. He tells me that he does have a little bit of pain at the site but nothing too significant at this time which is good news. Fortunately this overall appears to be doing fairly well which is good news. No fevers, chills, nausea, or vomiting noted at this time. The biggest issue at this point is that the wound is of quite significant size. READMISSION 04/21/2019 This is a 86 year old man with multiple medical problems who was seen once in the clinic here in January 2020 with a skin tear on his right forearm seen by [name]. This problem this time started sometime recently. He was noted by podiatry to have erosions and wounds of his right foot on toes 1-3. He also saw [name] of dermatology who gave him Silvadene cream for what I think was felt to be a stasis dermatitis of his bilateral lower legs. At some point he was referred to [name]. He was also noted to have previous noninvasive arterial studies that showed no flow to either one of his toes and noncompressible ABIs bilaterally. [name] did noninvasive arterial studies on him. These showed that he had greater than 75% stenosis in the proximal and mid common femoral artery. Monophasic flow in the posterior tibial and dorsalis pedis positions on the right foot. ABIs again were noncompressible. He was felt to have total occlusion of the superficial femoral artery on the right. The overall interpretation is that he had severe multilevel arterial occlusive disease. Absent femoral pulses and superficial femoral artery occlusions. In spite of this he was not felt to be candidate for arteriography given his bilateral femoral artery occlusions as well he would not be a candidate for endovascular approach secondary to his common femoral artery occlusions and proximal iliac disease. He was not felt to be a candidate for an open infra inguinal bypass. He therefore felt he would need to be monitored over time with the only option being a right below-knee amputation The patient complains currently of pain at night when he is up in bed. The pain in the right foot is better when he puts the leg down over the bed side. This is compatible with rest pain. He has very limited activity i.e. is even exhausted getting dressed in the morning because of cardiopulmonary issues therefore it is difficult to gauge claudication I believe he was  referred here by Dr. [name] with one of the major issues is whether he would be a candidate for hyperbaric oxygen. He is not a diabetic. Dr. [name] did give him Silvadene cream for dry flaking skin in his bilateral lower extremities, however the patient is concerned about using this in the face of  sulfa allergy. 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 This is on the medial aspect extended there is a second area on the plantar aspect. Most of what the patient talks about is claudication with minimal activity [however the patient is limited by his COPD], or even at rest at night. He is not getting a lot of sleep. We use silver collagen on this wound 5/4; the patient has a wound on the tip of his right great toe also on the medial aspect. Setting of severe nonrevascularizable PAD [see description in HPI]. He does not describe any change in his pain. We have been using silver collagen 5/18; 2-week follow-up. The patient has wound on the tip of his right great toe as well as the medial aspect. He has developed new wounds x2 on the right lateral foot and the right lateral calcaneus. These look like ischemic areas. The patient talks about claudication at night. He seems to be more comfortable during the day. We have been using silver collagen to the wounds. I have previously debrided the eschar on the toes however this comes right back. 6/1; 2-week follow-up. This patient has ischemic wounds on his right foot in the setting of nonunreconstructable PAD [previously reviewed by Dr. Dixon]. He does not really describe severe pain but he does have episodic pain in the right great toe. He has an area on the tip of the right great toe as well as the medial aspect of the right great toe. A new area on the tip of the right fourth toe. He has the area on the right lateral foot fortunately the area on the right lateral heel appears to have epithelialized over. We are using silver  collagen to all wounds Objective Constitutional Patient is hypertensive.. Pulse regular and within target range for patient.Marland Kitchen Respirations regular, non-labored and within target range.. Temperature is normal and within the target range for the patient.Marland Kitchen Appears in no distress. Vitals Time Taken: 3:06 PM, Height: 69 in, Weight: 132 lbs, BMI: 19.5, Temperature: 98.4 F, Pulse: 105 bpm, Respiratory Rate: 16 breaths/min, Blood Pressure: 156/75 mmHg. Cardiovascular Pedal pulses absent on the right.. General Notes: Wound exam; wound is on the tip of the right great toe and the medial part of the toe. This has thick adherent eschar. I did not remove this. It does not seem to be any different. I.e. not any worse ooSmall linear ischemic area on the right lateral foot I debrided with a #3 curette to remove adherent fibrinous debris. I will see if we can stimulating any healing or will this simply reform as it was prior to today's debridement. We will see next time we see him. ooThe area on the lateral heel has healed somewhat surprising ooHe has a small ischemic-looking spot on the right fourth toe tip I did not debride this either. I am going to watch this Integumentary (Hair,  Skin) Wound #2 status is Open. Original cause of wound was Gradually Appeared. The wound is located on the Right T Great. The wound measures 0.3cm length x oe 0.7cm width x 0.1cm depth; 0.165cm^2 area and 0.016cm^3 volume. There is Fat Layer (Subcutaneous Tissue) Exposed exposed. There is no tunneling or undermining noted. There is a small amount of serosanguineous drainage noted. The wound margin is flat and intact. There is no granulation within the wound bed. There is a large (67-100%) amount of necrotic tissue within the wound bed including Adherent Slough. Wound #4 status is Open. Original cause of wound was Gradually Appeared. The wound is located on the Right,Medial T Great. The wound measures 1.3cm oe length x 1cm  width x 0.1cm depth; 1.021cm^2 area and 0.102cm^3 volume. There is Fat Layer (Subcutaneous Tissue) Exposed exposed. There is no tunneling or undermining noted. There is a small amount of serous drainage noted. The wound margin is flat and intact. There is no granulation within the wound bed. There is a large (67-100%) amount of necrotic tissue within the wound bed including Eschar. Wound #5 status is Healed - Epithelialized. Original cause of wound was Gradually Appeared. The wound is located on the Right Calcaneus. The wound measures 0cm length x 0cm width x 0cm depth; 0cm^2 area and 0cm^3 volume. Wound #6 status is Open. Original cause of wound was Gradually Appeared. The wound is located on the Right,Distal,Lateral Foot. The wound measures 1.1cm length x 0.6cm width x 0.1cm depth; 0.518cm^2 area and 0.052cm^3 volume. There is no tunneling or undermining noted. There is a medium amount of serosanguineous drainage noted. The wound margin is flat and intact. There is no granulation within the wound bed. There is a large (67-100%) amount of necrotic tissue within the wound bed including Adherent Slough. Wound #7 status is Healed - Epithelialized. Original cause of wound was Gradually Appeared. The wound is located on the Right,Proximal,Lateral Foot. The wound measures 0cm length x 0cm width x 0cm depth; 0cm^2 area and 0cm^3 volume. Wound #8 status is Open. Original cause of wound was Gradually Appeared. The wound is located on the Right T Fourth. The wound measures 0.2cm length oe x 0.3cm width x 0.1cm depth; 0.047cm^2 area and 0.005cm^3 volume. There is no tunneling or undermining noted. There is a none present amount of drainage noted. The wound margin is distinct with the outline attached to the wound base. There is no granulation within the wound bed. There is a large (67-100%) amount of necrotic tissue within the wound bed including Eschar. Assessment Active Problems ICD-10 Atherosclerosis of  native arteries of right leg with ulceration of other part of foot Non-pressure chronic ulcer of other part of right foot limited to breakdown of skin Chronic venous hypertension (idiopathic) with inflammation of bilateral lower extremity Procedures Wound #6 Pre-procedure diagnosis of Wound #6 is an Arterial Insufficiency Ulcer located on the Right,Distal,Lateral Foot .Severity of Tissue Pre Debridement is: Fat layer exposed. There was a Excisional Skin/Subcutaneous Tissue Debridement with a total area of 0.66 sq cm performed by Ricard Dillon., MD. With the following instrument(s): Curette to remove Viable and Non-Viable tissue/material. Material removed includes Subcutaneous Tissue, Slough, Skin: Dermis, and Skin: Epidermis after achieving pain control using Lidocaine 5% topical ointment. No specimens were taken. A time out was conducted at 16:21, prior to the start of the procedure. A Minimum amount of bleeding was controlled with Pressure. The procedure was tolerated well with a pain level of 0 throughout and a pain  level of 0 following the procedure. Post Debridement Measurements: 1.1cm length x 0.6cm width x 0.1cm depth; 0.052cm^3 volume. Character of Wound/Ulcer Post Debridement is improved. Severity of Tissue Post Debridement is: Fat layer exposed. Post procedure Diagnosis Wound #6: Same as Pre-Procedure Plan Follow-up Appointments: Return Appointment in 2 weeks. Dressing Change Frequency: Wound #2 Right T Great: oe Change Dressing every other day. Skin Barriers/Peri-Wound Care: Moisturizing lotion - to both legs and feet daily TCA Cream or Ointment Wound Cleansing: May shower and wash wound with soap and water. - on days that dressing is changed Primary Wound Dressing: Wound #2 Right T Great: oe Silver Collagen - moisten with hydrogel or KY jelly Wound #4 Right,Medial T Great: oe Silver Collagen - moisten with hydrogel or KY jelly Wound #6 Right,Distal,Lateral  Foot: Silver Collagen - moisten with hydrogel or KY jelly Secondary Dressing: Wound #2 Right T Great: oe Foam Kerlix/Rolled Gauze Dry Gauze Wound #4 Right,Medial T Great: oe Kerlix/Rolled Gauze Dry Gauze Heel Cup Wound #6 Right,Distal,Lateral Foot: Kerlix/Rolled Gauze Dry Gauze Heel Cup Off-Loading: Open toe surgical shoe to: - right foot 1. I continue with silver collagen to all wound areas 2. I did debride the right lateral foot to see if we can stimulate some epithelialization. This was prompted by the healing of the area on the right lateral calcaneus 3. I am not planning to do the toes. I did the 2 areas on the first toe when he first came here for 2 solid weeks and got absolutely nowhere. I think these simply do not have enough blood flow 4. I am concerned about the tip of the right fourth toe but I am going to watch this over the next 2 weeks Electronic Signature(s) Signed: 06/10/2019 5:52:05 PM By: [name] MD Entered By: [name] on 06/10/2019 17:23:16 -------------------------------------------------------------------------------- SuperBill Details Patient Name: Date of Service: DO RN, [name] 06/10/2019 Medical Record Number: [id number] Patient Account Number: [id number] Date of Birth/Sex: Treating RN: Oct 12, 1929 (86 y.o. [name]) [name] Primary Care Provider: Piedad Climes, CHA RLES Other Clinician: Referring Provider: Treating Provider/Extender: Mare Ferrari, CHA RLES Weeks in Treatment: 7 Diagnosis Coding ICD-10 Codes Code Description I70.235 Atherosclerosis of native arteries of right leg with ulceration of other part of foot L97.511 Non-pressure chronic ulcer of other part of right foot limited to breakdown of skin I87.323 Chronic venous hypertension (idiopathic) with inflammation of bilateral lower extremity Facility Procedures CPT4 Code: [id number] Description: [id number] - DEB SUBQ TISSUE 20 SQ CM/< ICD-10 Diagnosis Description I70.235  Atherosclerosis of native arteries of right leg with ulceration of other part L97.511 Non-pressure chronic ulcer of other part of right foot limited to breakdown of  I87.323 Chronic venous hypertension (idiopathic) with inflammation of bilateral lower Modifier: of foot skin extremity Quantity: 1 Physician Procedures : CPT4 Code Description Modifier [id number] - WC PHYS SUBQ TISS 20 SQ CM ICD-10 Diagnosis Description I70.235 Atherosclerosis of native arteries of right leg with ulceration of other part of foot L97.511 Non-pressure chronic ulcer of other part of  right foot limited to breakdown of skin I87.323 Chronic venous hypertension (idiopathic) with inflammation of bilateral lower extremity Quantity: 1 Electronic Signature(s) Signed: 06/10/2019 5:52:05 PM By: [name] MD Entered By: [name] on 06/10/2019 17:23:34

## 2019-06-11 NOTE — Progress Notes (Signed)
[name], [name] ([id number]) Visit Report for 06/10/2019 Arrival Information Details Patient Name: Date of Service: DO RN, [place] V ID L. 06/10/2019 2:45 PM Medical Record Number: [id number] Patient Account Number: [id number] Date of Birth/Sex: Treating RN: [birth year] (86 y.o. [name] Primary Care Katharina Jehle: Piedad Climes, CHA RLES Other Clinician: Referring Amyre Segundo: Treating Matheson Vandehei/Extender: Mare Ferrari, CHA RLES Weeks in Treatment: 7 Visit Information History Since Last Visit Added or deleted any medications: No Patient Arrived: Wheel Chair Any new allergies or adverse reactions: No Arrival Time: 15:05 Had a fall or experienced change in No Accompanied By: wife activities of daily living that may affect Transfer Assistance: None risk of falls: Patient Identification Verified: Yes Signs or symptoms of abuse/neglect since No Secondary Verification Process Completed: Yes last visito Patient Requires Transmission-Based Precautions: No Hospitalized since last visit: No Patient Has Alerts: Yes Implantable device outside of the clinic No Patient Alerts: Right ABI: [place], TBI: 0 excluding Left ABI: [place], TBI:0 cellular tissue based products placed in the center since last visit: Has Dressing in Place as Prescribed: Yes Has Footwear/Offloading in Place as Yes Prescribed: Right: Surgical Shoe with Pressure Relief Insole Pain Present Now: No Electronic Signature(s) Signed: 06/10/2019 5:56:12 PM By: [name] Entered By: [name] on 06/10/2019 15:11:58 -------------------------------------------------------------------------------- Encounter Discharge Information Details Patient Name: Date of Service: DO RN, [name] 06/10/2019 2:45 PM Medical Record Number: [id number] Patient Account Number: [id number] Date of Birth/Sex: Treating RN: 1929-02-03 (86 y.o. [name] Primary Care Kamonte Mcmichen: Piedad Climes, CHA RLES Other Clinician: Referring Munir Victorian: Treating  Wilmarie Sparlin/Extender: Mare Ferrari, CHA RLES Weeks in Treatment: 7 Encounter Discharge Information Items Post Procedure Vitals Discharge Condition: Stable Temperature (F): 98.4 Ambulatory Status: Wheelchair Pulse (bpm): 105 Discharge Destination: Home Respiratory Rate (breaths/min): 16 Transportation: Private Auto Blood Pressure (mmHg): 156/75 Accompanied By: wife Schedule Follow-up Appointment: Yes Clinical Summary of Care: Patient Declined Electronic Signature(s) Signed: 06/11/2019 7:13:26 AM By: Kela Millin Entered By: Kela Millin on 06/10/2019 16:50:51 -------------------------------------------------------------------------------- Lower Extremity Assessment Details Patient Name: Date of Service: DO RN, [name] 06/10/2019 2:45 PM Medical Record Number: [id number] Patient Account Number: [id number] Date of Birth/Sex: Treating RN: 05/10/1929 (86 y.o. [name] Primary Care Calix Heinbaugh: Piedad Climes, CHA RLES Other Clinician: Referring Kjuan Seipp: Treating Shade Kaley/Extender: Mare Ferrari, CHA RLES Weeks in Treatment: 7 Edema Assessment Assessed: [Left: No] [Right: Yes] Edema: [Left: Ye] [Right: s] Calf Left: Right: Point of Measurement: cm From Medial Instep cm 35 cm Ankle Left: Right: Point of Measurement: cm From Medial Instep cm 26 cm Electronic Signature(s) Signed: 06/10/2019 5:56:12 PM By: [name] Entered By: [name] on 06/10/2019 15:12:35 -------------------------------------------------------------------------------- Multi Wound Chart Details Patient Name: Date of Service: DO RN, [name] 06/10/2019 2:45 PM Medical Record Number: [id number] Patient Account Number: [id number] Date of Birth/Sex: Treating RN: 1929-10-10 (86 y.o. [name]) [name] Primary Care Ovie Eastep: Piedad Climes, CHA RLES Other Clinician: Referring Isabele Lollar: Treating Caiden Monsivais/Extender: Mare Ferrari, CHA RLES Weeks in Treatment: 7 Vital Signs Height(in):  69 Pulse(bpm): 105 Weight(lbs): 132 Blood Pressure(mmHg): 156/75 Body Mass Index(BMI): 19 Temperature(F): 98.4 Respiratory Rate(breaths/min): 16 Photos: [2:No Photos Right T Great oe] [4:No Photos Right, Medial T Great oe] [5:No Photos Right Calcaneus] Wound Location: [2:Gradually Appeared] [4:Gradually Appeared] [5:Gradually Appeared] Wounding Event: [2:Arterial Insufficiency Ulcer] [4:Arterial Insufficiency Ulcer] [5:Arterial Insufficiency Ulcer] Primary Etiology: [2:Cataracts, Asthma, Chronic] [4:Cataracts, Asthma, Chronic] [5:N/A] Comorbid History: [2:Obstructive Pulmonary Disease (COPD), Congestive Heart Failure, Coronary Artery Disease, Hypertension,  Peripheral Arterial Disease, Osteoarthritis, Confinement Anxiety 03/31/2019] [4:Obstructive Pulmonary Disease (COPD), Congestive  Heart Failure, Coronary Artery Disease, Hypertension, Peripheral Arterial Disease, Osteoarthritis, Confinement Anxiety 05/13/2019] [5:05/23/2019] Date Acquired: [2:7] [4:4] [5:2] Weeks of Treatment: [2:Open] [4:Open] [5:Healed - Epithelialized] Wound Status: [2:0.3x0.7x0.1] [4:1.3x1x0.1] [5:0x0x0] Measurements L x W x D (cm) [2:0.165] [4:1.021] [5:0] A (cm) : rea [2:0.016] [4:0.102] [5:0] Volume (cm) : [2:87.30%] [4:33.70%] [5:100.00%] % Reduction in Area: [2:87.70%] [4:33.80%] [5:100.00%] % Reduction in Volume: [2:Full Thickness Without Exposed] [4:Full Thickness Without Exposed] [5:Full Thickness Without Exposed] Classification: [2:Support Structures Small] [4:Support Structures Small] [5:Support Structures N/A] Exudate A mount: [2:Serosanguineous] [4:Serous] [5:N/A] Exudate Type: [2:red, brown] [4:amber] [5:N/A] Exudate Color: [2:Flat and Intact] [4:Flat and Intact] [5:N/A] Wound Margin: [2:None Present (0%)] [4:None Present (0%)] [5:N/A] Granulation A mount: [2:Large (67-100%)] [4:Large (67-100%)] [5:N/A] Necrotic A mount: [2:Adherent Slough] [4:Eschar] [5:N/A] Necrotic Tissue: [2:Fat Layer (Subcutaneous  Tissue)] [4:Fat Layer (Subcutaneous Tissue)] [5:N/A] Exposed Structures: [2:Exposed: Yes Fascia: No Tendon: No Muscle: No Joint: No Bone: No Medium (34-66%)] [4:Exposed: Yes Fascia: No Tendon: No Muscle: No Joint: No Bone: No Small (1-33%)] [5:N/A] Epithelialization: [2:N/A] [4:N/A] [5:N/A] Debridement: [2:N/A] [4:N/A] [5:N/A] Pain Control: [2:N/A] [4:N/A] [5:N/A] Tissue Debrided: [2:N/A] [4:N/A] [5:N/A] Level: [2:N/A] [4:N/A] [5:N/A] Debridement A (sq cm): [2:rea N/A] [4:N/A] [5:N/A] Instrument: [2:N/A] [4:N/A] [5:N/A] Bleeding: [2:N/A] [4:N/A] [5:N/A] Hemostasis A chieved: [2:N/A] [4:N/A] [5:N/A] Procedural Pain: [2:N/A] [4:N/A] [5:N/A] Post Procedural Pain: Debridement Treatment Response: N/A [4:N/A] [5:N/A] Post Debridement Measurements L x N/A [4:N/A] [5:N/A] W x D (cm) [2:N/A] [4:N/A] [5:N/A] Post Debridement Volume: (cm) [2:N/A] [4:N/A] [5:N/A] Wound Number: [id number]  Photos: No Photos No Photos No Photos Right, Distal, Lateral Foot Right, Proximal, Lateral Foot Right T Fourth oe Wound Location: Gradually Appeared Gradually Appeared Gradually Appeared Wounding Event: 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 Primary Etiology: Cataracts, Asthma, Chronic N/A Cataracts, Asthma, Chronic Comorbid History: Obstructive Pulmonary Disease Obstructive Pulmonary Disease (COPD), Congestive Heart Failure, (COPD), Congestive Heart Failure, Coronary Artery Disease, Coronary Artery Disease, Hypertension, Peripheral Arterial Hypertension, Peripheral Arterial Disease, Osteoarthritis, Confinement Disease, Osteoarthritis, Confinement Anxiety Anxiety 05/23/2019 05/23/2019 06/03/2019 Date Acquired: 2 2 0 Weeks of Treatment: Open Healed - Epithelialized Open Wound Status: 1.1x0.6x0.1 0x0x0 0.2x0.3x0.1 Measurements L x W x D (cm) 0.518 0 0.047 A (cm) : rea 0.052 0 0.005 Volume (cm) : -65.00% 100.00% N/A % Reduction in A rea: -67.70% 100.00%  N/A % Reduction in Volume: Unclassifiable Unclassifiable Unclassifiable Classification: Medium N/A None Present Exudate A mount: Serosanguineous N/A N/A Exudate Type: red, brown N/A N/A Exudate Color: Flat and Intact N/A Distinct, outline attached Wound Margin: None Present (0%) N/A None Present (0%) Granulation A mount: Large (67-100%) N/A Large (67-100%) Necrotic A mount: Adherent Slough N/A Eschar Necrotic Tissue: Fascia: No N/A Fascia: No Exposed Structures: Fat Layer (Subcutaneous Tissue) Fat Layer (Subcutaneous Tissue) Exposed: No Exposed: No Tendon: No Tendon: No Muscle: No Muscle: No Joint: No Joint: No Bone: No Bone: No None N/A None Epithelialization: Debridement - Excisional N/A N/A Debridement: Pre-procedure Verification/Time Out 16:21 N/A N/A Taken: Lidocaine 5% topical ointment N/A N/A Pain Control: Subcutaneous, Slough N/A N/A Tissue Debrided: Skin/Subcutaneous Tissue N/A N/A Level: 0.66 N/A N/A Debridement A (sq cm): rea Curette N/A N/A Instrument: Minimum N/A N/A Bleeding: Pressure N/A N/A Hemostasis A chieved: 0 N/A N/A Procedural Pain: 0 N/A N/A Post Procedural Pain: Procedure was tolerated well N/A N/A Debridement Treatment Response: Post Debridement Measurements L x 1.1x0.6x0.1 N/A N/A W x D (cm) 0.052 N/A N/A Post Debridement Volume: (  cm) Debridement N/A N/A Procedures Performed: Treatment Notes Wound #2 (Right Toe Great) 1. Cleanse With Wound Cleanser Soap and water 2. Periwound Care Moisturizing lotion 3. Primary Dressing Applied Collegen AG 4. Secondary Dressing Dry Gauze Roll Gauze Heel Cup 5. Secured With Tape Wound #4 (Right, Medial Toe Great) 1. Cleanse With Wound Cleanser Soap and water 2. Periwound Care Moisturizing lotion 3. Primary Dressing Applied [employer] 4. Secondary Dressing Dry Gauze Roll Gauze Heel Cup 5. Secured With Tape Wound #6 (Right, Distal, Lateral Foot) 1. Cleanse  With Wound Cleanser Soap and water 2. Periwound Care Moisturizing lotion 3. Primary Dressing Applied [employer] 4. Secondary Dressing Dry Gauze Roll Gauze Heel Cup 5. Secured With Tape Wound #8 (Right Toe Fourth) 1. Cleanse With Wound Cleanser Soap and water 2. Periwound Care Moisturizing lotion 3. Primary Dressing Applied [employer] 4. Secondary Dressing Dry Gauze Roll Gauze Heel Cup 5. Secured With Recruitment consultant) Signed: 06/10/2019 5:52:05 PM By: [name] MD Signed: 06/11/2019 4:36:43 PM By: Travis Coria RN Entered By: [name] on 06/10/2019 17:16:47 -------------------------------------------------------------------------------- Multi-Disciplinary Care Plan Details Patient Name: Date of Service: DO RN, [name] 06/10/2019 2:45 PM Medical Record Number: [id number] Patient Account Number: [id number] Date of Birth/Sex: Treating RN: 10/07/1929 (86 y.o. [name]) [name] Primary Care Mayana Irigoyen: Piedad Climes, CHA RLES Other Clinician: Referring Rashidah [place]: Treating Lando Alcalde/Extender: Mare Ferrari, CHA RLES Weeks in Treatment: 7 Active Inactive Wound/Skin Impairment Nursing Diagnoses: Impaired tissue integrity Knowledge deficit related to ulceration/compromised skin integrity Goals: Patient/caregiver will verbalize understanding of skin care regimen Date Initiated: 04/21/2019 Target Resolution Date: 06/23/2019 Goal Status: Active Ulcer/skin breakdown will have a volume reduction of 30% by week 4 Date Initiated: 04/21/2019 Date Inactivated: 05/27/2019 Target Resolution Date: 05/23/2019 Goal Status: Met Ulcer/skin breakdown will have a volume reduction of 50% by week 8 Date Initiated: 05/27/2019 Target Resolution Date: 06/23/2019 Goal Status: Active Interventions: Assess patient/caregiver ability to obtain necessary supplies Assess patient/caregiver ability to perform ulcer/skin care regimen upon admission and as needed Assess ulceration(s)  every visit Provide education on ulcer and skin care Notes: Electronic Signature(s) Signed: 06/11/2019 4:36:43 PM By: Travis Coria RN Entered By: [name] on 06/10/2019 15:04:14 -------------------------------------------------------------------------------- Pain Assessment Details Patient Name: Date of Service: DO RN, [name] 06/10/2019 2:45 PM Medical Record Number: [id number] Patient Account Number: [id number] Date of Birth/Sex: Treating RN: [birth year] (86 y.o. [name] Primary Care Audyn Dimercurio: Piedad Climes, CHA RLES Other Clinician: Referring Rin Gorton: Treating Jordi Lacko/Extender: Mare Ferrari, CHA RLES Weeks in Treatment: 7 Active Problems Location of Pain Severity and Description of Pain Patient Has Paino No Site Locations Rate the pain. Current Pain Level: 0 Pain Management and Medication Current Pain Management: Medication: No Cold Application: No Rest: No Massage: No Activity: No T.E.N.S.: No Heat Application: No Leg drop or elevation: No Is the Current Pain Management Adequate: Adequate How does your wound impact your activities of daily livingo Sleep: No Bathing: No Appetite: No Relationship With Others: No Bladder Continence: No Emotions: No Bowel Continence: No Work: No Toileting: No Drive: No Dressing: No Hobbies: No Electronic Signature(s) Signed: 06/10/2019 5:56:12 PM By: [name] Entered By: [name] on 06/10/2019 15:12:26 -------------------------------------------------------------------------------- Patient/Caregiver Education Details Patient Name: Date of Service: DO RN, DA Threasa Alpha 6/1/2021andnbsp2:45 PM Medical Record Number: [id number] Patient Account Number: [id number] Date of Birth/Gender: Treating RN: Dec 28, 1929 (86 y.o. [name]) [name] Primary Care Physician: Piedad Climes, CHA RLES Other Clinician: Referring Physician: Treating Physician/Extender: Dellia Nims  Hulen Skains SS, CHA RLES Weeks in Treatment: 7 Education  Assessment Education Provided To: Patient Education Topics Provided Wound/Skin Impairment: Methods: Explain/Verbal Responses: State content correctly [employer]) Signed: 06/11/2019 4:36:43 PM By: Travis Coria RN Signed: 06/11/2019 4:36:43 PM By: Travis Coria RN Entered By: [name] on 06/10/2019 15:04:29 -------------------------------------------------------------------------------- Wound Assessment Details Patient Name: Date of Service: DO RN, [name] 06/10/2019 2:45 PM Medical Record Number: [id number] Patient Account Number: [id number] Date of Birth/Sex: Treating RN: 08/09/1929 (86 y.o. [name] Primary Care Levina Boyack: Piedad Climes, CHA RLES Other Clinician: Referring Farzana Koci: Treating Neshawn Aird/Extender: Mare Ferrari, CHA RLES Weeks in Treatment: 7 Wound Status Wound Number: 2 Primary Arterial Insufficiency Ulcer Etiology: Wound Location: Right T Great oe Wound Open Wounding Event: Gradually Appeared Status: Date Acquired: 03/31/2019 Comorbid Cataracts, Asthma, Chronic Obstructive Pulmonary Disease Weeks Of Treatment: 7 History: (COPD), Congestive Heart Failure, Coronary Artery Disease, Clustered Wound: No Hypertension, Peripheral Arterial Disease, Osteoarthritis, Confinement Anxiety Wound Measurements Length: (cm) 0.3 Width: (cm) 0.7 Depth: (cm) 0.1 Area: (cm) 0.165 Volume: (cm) 0.016 % Reduction in Area: 87.3% % Reduction in Volume: 87.7% Epithelialization: Medium (34-66%) Tunneling: No Undermining: No Wound Description Classification: Full Thickness Without Exposed Support Structures Wound Margin: Flat and Intact Exudate Amount: Small Exudate Type: Serosanguineous Exudate Color: red, brown Foul Odor After Cleansing: No Slough/Fibrino Yes Wound Bed Granulation Amount: None Present (0%) Exposed Structure Necrotic Amount: Large (67-100%) Fascia Exposed: No Necrotic Quality: Adherent Slough Fat Layer (Subcutaneous Tissue)  Exposed: Yes Tendon Exposed: No Muscle Exposed: No Joint Exposed: No Bone Exposed: No Treatment Notes Wound #2 (Right Toe Great) 1. Cleanse With Wound Cleanser Soap and water 2. Periwound Care Moisturizing lotion 3. Primary Dressing Applied Collegen [employer] 4. Secondary Dressing Dry Gauze Roll Gauze Heel Cup 5. Secured With Recruitment consultant) Signed: 06/10/2019 5:56:12 PM By: [name] Entered By: [name] on 06/10/2019 15:11:19 -------------------------------------------------------------------------------- Wound Assessment Details Patient Name: Date of Service: DO RN, [name] 06/10/2019 2:45 PM Medical Record Number: [id number] Patient Account Number: [id number] Date of Birth/Sex: Treating RN: [birth year] (86 y.o. [name] Primary Care Guthrie Lemme: Piedad Climes, CHA RLES Other Clinician: Referring Anthonyjames Bargar: Treating Tykesha Konicki/Extender: Mare Ferrari, CHA RLES Weeks in Treatment: 7 Wound Status Wound Number: 4 Primary Arterial Insufficiency Ulcer Etiology: Wound Location: Right, Medial T Great oe Wound Open Wounding Event: Gradually Appeared Status: Date Acquired: 05/13/2019 Comorbid Cataracts, Asthma, Chronic Obstructive Pulmonary Disease Weeks Of Treatment: 4 History: (COPD), Congestive Heart Failure, Coronary Artery Disease, Clustered Wound: No Hypertension, Peripheral Arterial Disease, Osteoarthritis, Confinement Anxiety Wound Measurements Length: (cm) 1.3 Width: (cm) 1 Depth: (cm) 0.1 Area: (cm) 1.021 Volume: (cm) 0.102 % Reduction in Area: 33.7% % Reduction in Volume: 33.8% Epithelialization: Small (1-33%) Tunneling: No Undermining: No Wound Description Classification: Full Thickness Without Exposed Support Structures Wound Margin: Flat and Intact Exudate Amount: Small Exudate Type: Serous Exudate Color: amber Foul Odor After Cleansing: No Slough/Fibrino Yes Wound Bed Granulation Amount: None Present (0%) Exposed  Structure Necrotic Amount: Large (67-100%) Fascia Exposed: No Necrotic Quality: Eschar Fat Layer (Subcutaneous Tissue) Exposed: Yes Tendon Exposed: No Muscle Exposed: No Joint Exposed: No Bone Exposed: No Treatment Notes Wound #4 (Right, Medial Toe Great) 1. Cleanse With Wound Cleanser Soap and water 2. Periwound Care Moisturizing lotion 3. Primary Dressing Applied [employer] 4. Secondary Dressing Dry Gauze Roll Gauze Heel Cup 5. Secured With Recruitment consultant) Signed: 06/10/2019 5:56:12 PM By: [name] Entered By: [name] on 06/10/2019 15:10:27 --------------------------------------------------------------------------------  Wound Assessment Details Patient Name: Date of Service: DO RN, [name] 06/10/2019 2:45 PM Medical Record Number: [id number] Patient Account Number: [id number] Date of Birth/Sex: Treating RN: 21-Apr-1929 (86 y.o. [name] Primary Care Kimmerly Lora: Piedad Climes, CHA RLES Other Clinician: Referring Rebeckah Masih: Treating Danniela Mcbrearty/Extender: Mare Ferrari, CHA RLES Weeks in Treatment: 7 Wound Status Wound Number: 5 Primary Etiology: Arterial Insufficiency Ulcer Wound Location: Right Calcaneus Wound Status: Healed - Epithelialized Wounding Event: Gradually Appeared Date Acquired: 05/23/2019 Weeks Of Treatment: 2 Clustered Wound: No Wound Measurements Length: (cm) Width: (cm) Depth: (cm) Area: (cm) Volume: (cm) 0 % Reduction in Area: 100% 0 % Reduction in Volume: 100% 0 0 0 Wound Description Classification: Full Thickness Without Exposed Support Structur es Electronic Signature(s) Signed: 06/10/2019 5:56:12 PM By: [name] Entered By: [name] on 06/10/2019 15:11:32 -------------------------------------------------------------------------------- Wound Assessment Details Patient Name: Date of Service: DO RN, [name] 06/10/2019 2:45 PM Medical Record Number: [id number] Patient Account Number:  [id number] Date of Birth/Sex: Treating RN: August 20, 1929 (86 y.o. [name] Primary Care Viveca Beckstrom: Piedad Climes, CHA RLES Other Clinician: Referring Kailyn Vanderslice: Treating Naya Ilagan/Extender: Mare Ferrari, CHA RLES Weeks in Treatment: 7 Wound Status Wound Number: 6 Primary Arterial Insufficiency Ulcer Etiology: Wound Location: Right, Distal, Lateral Foot Wound Open Wounding Event: Gradually Appeared Status: Date Acquired: 05/23/2019 Comorbid Cataracts, Asthma, Chronic Obstructive Pulmonary Disease Weeks Of Treatment: 2 History: (COPD), Congestive Heart Failure, Coronary Artery Disease, Clustered Wound: No Hypertension, Peripheral Arterial Disease, Osteoarthritis, Confinement Anxiety Wound Measurements Length: (cm) 1.1 Width: (cm) 0.6 Depth: (cm) 0.1 Area: (cm) 0.518 Volume: (cm) 0.052 % Reduction in Area: -65% % Reduction in Volume: -67.7% Epithelialization: None Tunneling: No Undermining: No Wound Description Classification: Unclassifiable Wound Margin: Flat and Intact Exudate Amount: Medium Exudate Type: Serosanguineous Exudate Color: red, brown Foul Odor After Cleansing: No Slough/Fibrino Yes Wound Bed Granulation Amount: None Present (0%) Exposed Structure Necrotic Amount: Large (67-100%) Fascia Exposed: No Necrotic Quality: Adherent Slough Fat Layer (Subcutaneous Tissue) Exposed: No Tendon Exposed: No Muscle Exposed: No Joint Exposed: No Bone Exposed: No Treatment Notes Wound #6 (Right, Distal, Lateral Foot) 1. Cleanse With Wound Cleanser Soap and water 2. Periwound Care Moisturizing lotion 3. Primary Dressing Applied [employer]le[employer] 4. Secondary Dressing Dry Gauze Roll Gauze Heel Cup 5. Secured With Recruitment consultant) Signed: 06/10/2019 5:56:12 PM By: [name] Entered By: [name] on 06/10/2019 15:09:56 -------------------------------------------------------------------------------- Wound Assessment Details Patient  Name: Date of Service: DO RN, [name] 06/10/2019 2:45 PM Medical Record Number: [id number] Patient Account Number: [id number] Date of Birth/Sex: Treating RN: Dec 12, 1929 (86 y.o. [name] Primary Care Justun Anaya: Piedad Climes, CHA RLES Other Clinician: Referring Mckennah Kretchmer: Treating Johann Santone/Extender: Mare Ferrari, CHA RLES Weeks in Treatment: 7 Wound Status Wound Number: 7 Primary Etiology: Arterial Insufficiency Ulcer Wound Location: Right, Proximal, Lateral Foot Wound Status: Healed - Epithelialized Wounding Event: Gradually Appeared Date Acquired: 05/23/2019 Weeks Of Treatment: 2 Clustered Wound: No Wound Measurements Length: (cm) Width: (cm) Depth: (cm) Area: (cm) Volume: (cm) 0 % Reduction in Area: 100% 0 % Reduction in Volume: 100% 0 0 0 Wound Description Classification: Unclassifiable Electronic Signature(s) Signed: 06/10/2019 5:56:12 PM By: [name] Entered By: [name] on 06/10/2019 15:11:33 -------------------------------------------------------------------------------- Wound Assessment Details Patient Name: Date of Service: DO RN, [name] 06/10/2019 2:45 PM Medical Record Number: [id number] Patient Account Number: [id number] Date of Birth/Sex: Treating RN: 1930-01-10 (86 y.o. [name] Primary Care Solash Tullo: Piedad Climes, CHA  RLES Other Clinician: Referring Roylee Chaffin: Treating Sherre Wooton/Extender: Mare Ferrari, CHA RLES Weeks in Treatment: 7 Wound Status Wound Number: 8 Primary Arterial Insufficiency Ulcer Etiology: Wound Location: Right T Fourth oe Wound Open Wounding Event: Gradually Appeared Status: Date Acquired: 06/03/2019 Comorbid Cataracts, Asthma, Chronic Obstructive Pulmonary Disease Weeks Of Treatment: 0 History: (COPD), Congestive Heart Failure, Coronary Artery Disease, Clustered Wound: No Hypertension, Peripheral Arterial Disease, Osteoarthritis, Confinement Anxiety Wound Measurements Length: (cm)  0.2 Width: (cm) 0.3 Depth: (cm) 0.1 Area: (cm) 0.047 Volume: (cm) 0.005 % Reduction in Area: % Reduction in Volume: Epithelialization: None Tunneling: No Undermining: No Wound Description Classification: Unclassifiable Wound Margin: Distinct, outline attached Exudate Amount: None Present Foul Odor After Cleansing: No Slough/Fibrino No Wound Bed Granulation Amount: None Present (0%) Exposed Structure Necrotic Amount: Large (67-100%) Fascia Exposed: No Necrotic Quality: Eschar Fat Layer (Subcutaneous Tissue) Exposed: No Tendon Exposed: No Muscle Exposed: No Joint Exposed: No Bone Exposed: No Treatment Notes Wound #8 (Right Toe Fourth) 1. Cleanse With Wound Cleanser Soap and water 2. Periwound Care Moisturizing lotion 3. Primary Dressing Applied [employer] 4. Secondary Dressing Dry Gauze Roll Gauze Heel Cup 5. Secured With Recruitment consultant) Signed: 06/10/2019 5:56:12 PM By: [name] Entered By: [name] on 06/10/2019 15:08:55 -------------------------------------------------------------------------------- Vitals Details Patient Name: Date of Service: DO RN, [name] 06/10/2019 2:45 PM Medical Record Number: [id number] Patient Account Number: [id number] Date of Birth/Sex: Treating RN: 1929/02/16 (86 y.o. [name] Primary Care Vadie Principato: Piedad Climes, CHA RLES Other Clinician: Referring Tyanna Hach: Treating Tyrae Alcoser/Extender: Mare Ferrari, CHA RLES Weeks in Treatment: 7 Vital Signs Time Taken: 15:06 Temperature (F): 98.4 Height (in): 69 Pulse (bpm): 105 Weight (lbs): 132 Respiratory Rate (breaths/min): 16 Body Mass Index (BMI): 19.5 Blood Pressure (mmHg): 156/75 Reference Range: 80 - 120 mg / dl Electronic Signature(s) Signed: 06/10/2019 5:56:12 PM By: [name] Entered By: [name] on 06/10/2019 15:12:18

## 2019-06-12 ENCOUNTER — Other Ambulatory Visit: Payer: Self-pay

## 2019-06-12 ENCOUNTER — Other Ambulatory Visit: Payer: Self-pay | Admitting: *Deleted

## 2019-06-12 NOTE — Patient Outreach (Signed)
[facility]) Care Management  06/12/2019  [name] 11-08-1929 [id number]   Call placed to member's wife to follow up on management of member's care.  She report she is doing about the same, still has the wounds, only one healed others are still healing.  Denies any new or expanding wounds, still being seen at the wound clinic.  Was last seen on 6/1, follow up scheduled for 6/15.  Report he does have some pain, was prescribed gabapentin but state it didn't work.  If more medication needed, she will contact PCP or vascular office.    This care manger inquired about contact from [facility], she denies.  Will follow up on referral and estimated contact time.  She remains interested in list of facilities that are affiliated with [facility] that don't require a 3 day hospital stay.  She is not wanting to place member at this time but will need info for the future.    Denies any urgent concerns at this time, will send list of facilities and contact [facility].  Will follow up within the next 2 weeks.  [facility] CM Care Plan Problem One     Most Recent Value  Care Plan Problem One  Risk for infection related to open wounds  Role Documenting the Problem One  Care Management [place] for Problem One  Active  [facility] Long Term Goal   Wife will report no signs of infection within the next 31 days  [facility] Long Term Goal Start Date  06/02/19  Interventions for Problem One Long Term Goal  Discussed schedule for wound therapy and process of dressing changes at home.  [facility] CM Short Term Goal #1   Wife will report in home assessment complete with [facility] within the next 2 weeks  [facility] CM Short Term Goal #1 Start Date  06/02/19  Interventions for Short Term Goal #1  Outreach made to [facility] to follow up on referral  [facility] CM Short Term Goal #2   Wife will be able to verbalize long term plan within the next 2 weeks  [facility] CM Short Term Goal #2 Start Date  06/02/19  Interventions for  Short Term Goal #2  Discussed facility options with wife, encouraged to reconsider hospice versus palliative care     Travis Clevester, RN, MSN [place] Manager [phone]

## 2019-06-13 ENCOUNTER — Other Ambulatory Visit: Payer: Self-pay | Admitting: *Deleted

## 2019-06-13 NOTE — Patient Outreach (Signed)
[facility] Care Management  06/13/2019  [name] Oct 14, 1929 [id number]   Call received from member's wife stating he had a low blood pressure and dizziness yesterday, 70/50.  Report he doubled up on his Lasix accidentally.  She gave him extra fluids and after a while BP increased to SBP 100.  Denies rechecking this morning, advised to do so and if remain low contact MD office for further instructions on medication management for the day.  She verbalizes understanding, will contact this care manager later today with updated readings.  Valente Bronx, [place], MSN [place] [phone]

## 2019-06-17 DIAGNOSIS — R350 Frequency of micturition: Secondary | ICD-10-CM | POA: Diagnosis not present

## 2019-06-17 DIAGNOSIS — R3915 Urgency of urination: Secondary | ICD-10-CM | POA: Diagnosis not present

## 2019-06-17 DIAGNOSIS — N401 Enlarged prostate with lower urinary tract symptoms: Secondary | ICD-10-CM | POA: Diagnosis not present

## 2019-06-17 DIAGNOSIS — R3912 Poor urinary stream: Secondary | ICD-10-CM | POA: Diagnosis not present

## 2019-06-17 DIAGNOSIS — C669 Malignant neoplasm of unspecified ureter: Secondary | ICD-10-CM | POA: Diagnosis not present

## 2019-06-23 ENCOUNTER — Other Ambulatory Visit: Payer: Self-pay

## 2019-06-23 ENCOUNTER — Encounter (HOSPITAL_BASED_OUTPATIENT_CLINIC_OR_DEPARTMENT_OTHER): Payer: Medicare Other | Admitting: Physician Assistant

## 2019-06-23 DIAGNOSIS — I70235 Atherosclerosis of native arteries of right leg with ulceration of other part of foot: Secondary | ICD-10-CM | POA: Diagnosis not present

## 2019-06-23 DIAGNOSIS — L97512 Non-pressure chronic ulcer of other part of right foot with fat layer exposed: Secondary | ICD-10-CM | POA: Diagnosis not present

## 2019-06-23 DIAGNOSIS — I7389 Other specified peripheral vascular diseases: Secondary | ICD-10-CM | POA: Diagnosis not present

## 2019-06-23 DIAGNOSIS — S51811A Laceration without foreign body of right forearm, initial encounter: Secondary | ICD-10-CM | POA: Diagnosis not present

## 2019-06-23 NOTE — Progress Notes (Addendum)
[name], [name] ([id number]) Visit Report for 06/23/2019 Chief Complaint Document Details Patient Name: Date of Service: DO RN, [place] V ID L. 06/23/2019 2:15 PM Medical Record Number: [id number] Patient Account Number: [id number] Date of Birth/Sex: Treating RN: 1929-07-14 (86 y.o. [name] Primary Care Provider: Piedad Climes, CHA RLES Other Clinician: Referring Provider: Treating Provider/Extender: Worthy Keeler RO SS, CHA RLES Weeks in Treatment: 9 Information Obtained from: Patient Chief Complaint Right forearm skin tear 04/21/2019; patient is here for review of wound on the right medial great toe 06/23/2019: patient has new skin tears to the right forearm and left elbow Electronic Signature(s) Signed: 06/23/2019 4:05:56 PM By: Worthy Keeler PA-C Previous Signature: 06/23/2019 2:33:48 PM Version By: Worthy Keeler PA-C Entered By: Worthy Keeler on 06/23/2019 16:05:56 -------------------------------------------------------------------------------- Debridement Details Patient Name: Date of Service: DO RN, [name] 06/23/2019 2:15 PM Medical Record Number: [id number] Patient Account Number: [id number] Date of Birth/Sex: Treating RN: [birth year] (86 y.o. [name] Primary Care Provider: Piedad Climes, CHA RLES Other Clinician: Referring Provider: Treating Provider/Extender: Worthy Keeler RO SS, CHA RLES Weeks in Treatment: 9 Debridement Performed for Assessment: Wound #9 Right Forearm Performed By: Physician Worthy Keeler, PA Debridement Type: Debridement Level of Consciousness (Pre-procedure): Awake and Alert Pre-procedure Verification/Time Out Yes - 16:10 Taken: Start Time: 16:12 Pain Control: Lidocaine 4% T opical Solution T Area Debrided (L x W): otal 3 (cm) x 0.5 (cm) = 1.5 (cm) Tissue and other material debrided: Non-Viable, Skin: Dermis , Skin: Epidermis Level: Skin/Epidermis Debridement Description: Selective/Open Wound Instrument: Forceps, Scissors Bleeding:  Minimum Hemostasis Achieved: Pressure End Time: 16:14 Procedural Pain: 3 Post Procedural Pain: 2 Response to Treatment: Procedure was tolerated well Level of Consciousness (Post- Awake and Alert procedure): Post Debridement Measurements of Total Wound Length: (cm) 3 Width: (cm) 2 Depth: (cm) 0.1 Volume: (cm) 0.471 Character of Wound/Ulcer Post Debridement: Improved Post Procedure Diagnosis Same as Pre-procedure Electronic Signature(s) Signed: 06/23/2019 5:38:02 PM By: Baruch Gouty RN, BSN Signed: 06/23/2019 5:53:30 PM By: Worthy Keeler PA-C Entered By: Baruch Gouty on 06/23/2019 16:21:58 -------------------------------------------------------------------------------- Debridement Details Patient Name: Date of Service: DO RN, [name] 06/23/2019 2:15 PM Medical Record Number: [id number] Patient Account Number: [id number] Date of Birth/Sex: Treating RN: 1929/07/09 (86 y.o. [name] Primary Care Provider: Piedad Climes, CHA RLES Other Clinician: Referring Provider: Treating Provider/Extender: Worthy Keeler RO SS, CHA RLES Weeks in Treatment: 9 Debridement Performed for Assessment: Wound #6 Right,Distal,Lateral Foot Performed By: Physician Worthy Keeler, PA Debridement Type: Debridement Severity of Tissue Pre Debridement: Fat layer exposed Level of Consciousness (Pre-procedure): Awake and Alert Pre-procedure Verification/Time Out Yes - 16:10 Taken: Start Time: 16:12 Pain Control: Lidocaine 4% T opical Solution T Area Debrided (L x W): otal 1.4 (cm) x 0.5 (cm) = 0.7 (cm) Tissue and other material debrided: Non-Viable, Skin: Dermis , Skin: Epidermis Level: Skin/Epidermis Debridement Description: Selective/Open Wound Instrument: Forceps, Scissors Bleeding: Minimum Hemostasis Achieved: Pressure End Time: 16:14 Procedural Pain: 6 Post Procedural Pain: 4 Response to Treatment: Procedure was tolerated well Level of Consciousness (Post- Awake and  Alert procedure): Post Debridement Measurements of Total Wound Length: (cm) 1.4 Width: (cm) 0.5 Depth: (cm) 0.2 Volume: (cm) 0.11 Character of Wound/Ulcer Post Debridement: Improved Severity of Tissue Post Debridement: Fat layer exposed Post Procedure Diagnosis Same as Pre-procedure Electronic Signature(s) Signed: 06/23/2019 5:38:02 PM By: Baruch Gouty RN, BSN Signed: 06/23/2019 5:53:30 PM By: Worthy Keeler PA-C Entered By: Baruch Gouty on  06/23/2019 16:22:40 -------------------------------------------------------------------------------- HPI Details Patient Name: Date of Service: DO RN, [name] 06/23/2019 2:15 PM Medical Record Number: [id number] Patient Account Number: [id number] Date of Birth/Sex: Treating RN: [birth year] (86 y.o. [name] Primary Care Provider: Piedad Climes, CHA RLES Other Clinician: Referring Provider: Treating Provider/Extender: Worthy Keeler RO SS, CHA RLES Weeks in Treatment: 9 History of Present Illness HPI Description: 01/30/18 on evaluation today patient presents for initial inspection concerning the skin tear of the right forearm. Fortunately there does not appear to be any evidence of infection and this occurred approximately three days ago. He has been using Vaseline over the region. With that being said the patient does have a history of cataracts COPD, hypertension, peripheral arterial disease, and osteoarthritis. He tells me that he does have a little bit of pain at the site but nothing too significant at this time which is good news. Fortunately this overall appears to be doing fairly well which is good news. No fevers, chills, nausea, or vomiting noted at this time. The biggest issue at this point is that the wound is of quite significant size. READMISSION 04/21/2019 This is a 86 year old man with multiple medical problems who was seen once in the clinic here in January 2020 with a skin tear on his right forearm seen by [name]. This  problem this time started sometime recently. He was noted by podiatry to have erosions and wounds of his right foot on toes 1-3. He also saw [name] of dermatology who gave him Silvadene cream for what I think was felt to be a stasis dermatitis of his bilateral lower legs. At some point he was referred to [name]. He was also noted to have previous noninvasive arterial studies that showed no flow to either one of his toes and noncompressible ABIs bilaterally. [name] did noninvasive arterial studies on him. These showed that he had greater than 75% stenosis in the proximal and mid common femoral artery. Monophasic flow in the posterior tibial and dorsalis pedis positions on the right foot. ABIs again were noncompressible. He was felt to have total occlusion of the superficial femoral artery on the right. The overall interpretation is that he had severe multilevel arterial occlusive disease. Absent femoral pulses and superficial femoral artery occlusions. In spite of this he was not felt to be candidate for arteriography given his bilateral femoral artery occlusions as well he would not be a candidate for endovascular approach secondary to his common femoral artery occlusions and proximal iliac disease. He was not felt to be a candidate for an open infra inguinal bypass. He therefore felt he would need to be monitored over time with the only option being a right below-knee amputation The patient complains currently of pain at night when he is up in bed. The pain in the right foot is better when he puts the leg down over the bed side. This is compatible with rest pain. He has very limited activity i.e. is even exhausted getting dressed in the morning because of cardiopulmonary issues therefore it is difficult to gauge claudication I believe he was referred here by Dr. [name] with one of the major issues is whether he would be a candidate for hyperbaric oxygen. He is not a diabetic. Dr. [name] did  give him Silvadene cream for dry flaking skin in his bilateral lower extremities, however the patient is concerned about using this in the face of sulfa allergy. Podiatry had previously given him in a steroid  cream I do not believe these use this either Past medical history is extensive including basal cell and squamous cell skin cancers, combined congestive heart failure, O2 dependent COPD, cor pulmonale, venous stasis dermatitis, peripheral arterial disease, hearing loss, 4/20; the patient still has a wound on the right first toe. This is on the medial aspect extended there is a second area on the plantar aspect. Most of what the patient talks about is claudication with minimal activity [however the patient is limited by his COPD], or even at rest at night. He is not getting a lot of sleep. We use silver collagen on this wound 5/4; the patient has a wound on the tip of his right great toe also on the medial aspect. Setting of severe nonrevascularizable PAD [see description in HPI]. He does not describe any change in his pain. We have been using silver collagen 5/18; 2-week follow-up. The patient has wound on the tip of his right great toe as well as the medial aspect. He has developed new wounds x2 on the right lateral foot and the right lateral calcaneus. These look like ischemic areas. The patient talks about claudication at night. He seems to be more comfortable during the day. We have been using silver collagen to the wounds. I have previously debrided the eschar on the toes however this comes right back. 6/1; 2-week follow-up. This patient has ischemic wounds on his right foot in the setting of nonunreconstructable PAD [previously reviewed by Dr. Dixon]. He does not really describe severe pain but he does have episodic pain in the right great toe. He has an area on the tip of the right great toe as well as the medial aspect of the right great toe. A new area on the tip of the right fourth toe.  He has the area on the right lateral foot fortunately the area on the right lateral heel appears to have epithelialized over. We are using silver collagen to all wounds 06/23/2019 upon evaluation today patient appears to be doing about the same in regard to his foot ulcers. Unfortunately he has 2 new skin tears on his left elbow and the right forearm that were new as of today. He states that it was doing okay until they went to change the dressing and they put some collagen on it that got stuck and somewhat pulled back the skin unfortunately. Fortunately there is no signs of active infection at this time. No fevers, chills, nausea, vomiting, or diarrhea. Electronic Signature(s) Signed: 06/23/2019 5:35:55 PM By: Worthy Keeler PA-C Entered By: Worthy Keeler on 06/23/2019 17:35:55 -------------------------------------------------------------------------------- Physical Exam Details Patient Name: Date of Service: DO RN, [name] 06/23/2019 2:15 PM Medical Record Number: [id number] Patient Account Number: [id number] Date of Birth/Sex: Treating RN: 11/02/1929 (86 y.o. [name] Primary Care Provider: Piedad Climes, CHA RLES Other Clinician: Referring Provider: Treating Provider/Extender: Worthy Keeler RO SS, CHA RLES Weeks in Treatment: 9 Constitutional Well-nourished and well-hydrated in no acute distress. Respiratory normal breathing without difficulty. Psychiatric this patient is able to make decisions and demonstrates good insight into disease process. Alert and Oriented x 3. pleasant and cooperative. Notes Upon inspection patient's wounds actually appear to be doing okay he again is mainly eschar covered on the toe area which is okay at this point. I think Betadine we do quite well here. In regard to the foot I do suggest that we probably go ahead and debride is Dr. Dellia Nims has been doing I did  review his note and I feel like that based on his heel and how that progressed in the past  that I agree that some light debridement here could benefit the patient and stimulate a new epithelial and granulation growth. That was performed today. Also did trim off some of the excess skin that was on the right forearm location and he tolerated that without any pain whatsoever in fact he had more pain on the foot ulcer at this point. Electronic Signature(s) Signed: 06/23/2019 5:36:48 PM By: Worthy Keeler PA-C Entered By: Worthy Keeler on 06/23/2019 17:36:48 -------------------------------------------------------------------------------- Physician Orders Details Patient Name: Date of Service: DO RN, [name] 06/23/2019 2:15 PM Medical Record Number: [id number] Patient Account Number: [id number] Date of Birth/Sex: Treating RN: 07/10/1929 (86 y.o. [name] Primary Care Provider: Piedad Climes, CHA RLES Other Clinician: Referring Provider: Treating Provider/Extender: Worthy Keeler RO SS, CHA RLES Weeks in Treatment: 9 Verbal / Phone Orders: No Diagnosis Coding ICD-10 Coding Code Description I70.235 Atherosclerosis of native arteries of right leg with ulceration of other part of foot L97.511 Non-pressure chronic ulcer of other part of right foot limited to breakdown of skin I87.323 Chronic venous hypertension (idiopathic) with inflammation of bilateral lower extremity S51.801A Unspecified open wound of right forearm, initial encounter S51.002A Unspecified open wound of left elbow, initial encounter Follow-up Appointments Return Appointment in 2 weeks. Dressing Change Frequency Wound #2 Right T Great oe Change Dressing every other day. Skin Barriers/Peri-Wound Care Moisturizing lotion - to both legs and feet daily TCA Cream or Ointment Wound Cleansing May shower and wash wound with soap and water. - on days that dressing is changed Primary Wound Dressing Wound #10 Left Elbow Xeroform Wound #2 Right T Great oe Other: - paint with betadine Wound #4 Right,Medial T  Great oe Other: - paint with betadine Wound #6 Right,Distal,Lateral Foot Silver Collagen - moisten with hydrogel or [place] jelly Wound #9 Right Forearm Xeroform Secondary Dressing Wound #10 Left Elbow Foam Border - or gauze and rolled gauze Wound #2 Right T Great oe Foam Kerlix/Rolled Gauze Dry Gauze Wound #4 Right,Medial T Great oe Kerlix/Rolled Gauze Dry Gauze Wound #6 Right,Distal,Lateral Foot Kerlix/Rolled Gauze Dry Gauze Heel Cup Wound #9 Right Forearm Foam Border - or gauze and rolled gauze Off-Loading Open toe surgical shoe to: - right foot Electronic Signature(s) Signed: 06/23/2019 5:38:02 PM By: Baruch Gouty RN, BSN Signed: 06/23/2019 5:53:30 PM By: Worthy Keeler PA-C Entered By: Baruch Gouty on 06/23/2019 16:27:52 -------------------------------------------------------------------------------- Problem List Details Patient Name: Date of Service: DO RN, [name] 06/23/2019 2:15 PM Medical Record Number: [id number] Patient Account Number: [id number] Date of Birth/Sex: Treating RN: 12-03-1929 (86 y.o. [name] Primary Care Provider: Piedad Climes, CHA RLES Other Clinician: Referring Provider: Treating Provider/Extender: Worthy Keeler RO SS, CHA RLES Weeks in Treatment: 9 Active Problems ICD-10 Encounter Code Description Active Date MDM Diagnosis I70.235 Atherosclerosis of native arteries of right leg with ulceration of other part of 04/21/2019 No Yes foot L97.511 Non-pressure chronic ulcer of other part of right foot limited to breakdown of 04/21/2019 No Yes skin I87.323 Chronic venous hypertension (idiopathic) with inflammation of bilateral lower 04/21/2019 No Yes extremity S51.801A Unspecified open wound of right forearm, initial encounter 06/23/2019 No Yes S51.002A Unspecified open wound of left elbow, initial encounter 06/23/2019 No Yes Inactive Problems Resolved Problems Electronic Signature(s) Signed: 06/23/2019 4:05:11 PM By: Worthy Keeler  PA-C Previous Signature: 06/23/2019 2:33:42 PM Version By: Worthy Keeler PA-C  Entered By: Worthy Keeler on 06/23/2019 16:05:11 -------------------------------------------------------------------------------- Progress Note Details Patient Name: Date of Service: DO RN, [name] 06/23/2019 2:15 PM Medical Record Number: [id number] Patient Account Number: [id number] Date of Birth/Sex: Treating RN: 1929-07-18 (86 y.o. [name] Primary Care Provider: Piedad Climes, CHA RLES Other Clinician: Referring Provider: Treating Provider/Extender: Worthy Keeler RO SS, CHA RLES Weeks in Treatment: 9 Subjective Chief Complaint Information obtained from Patient Right forearm skin tear 04/21/2019; patient is here for review of wound on the right medial great toe 06/23/2019: patient has new skin tears to the right forearm and left elbow History of Present Illness (HPI) 01/30/18 on evaluation today patient presents for initial inspection concerning the skin tear of the right forearm. Fortunately there does not appear to be any evidence of infection and this occurred approximately three days ago. He has been using Vaseline over the region. With that being said the patient does have a history of cataracts COPD, hypertension, peripheral arterial disease, and osteoarthritis. He tells me that he does have a little bit of pain at the site but nothing too significant at this time which is good news. Fortunately this overall appears to be doing fairly well which is good news. No fevers, chills, nausea, or vomiting noted at this time. The biggest issue at this point is that the wound is of quite significant size. READMISSION 04/21/2019 This is a 86 year old man with multiple medical problems who was seen once in the clinic here in January 2020 with a skin tear on his right forearm seen by [name]. This problem this time started sometime recently. He was noted by podiatry to have erosions and wounds of his right  foot on toes 1-3. He also saw [name] of dermatology who gave him Silvadene cream for what I think was felt to be a stasis dermatitis of his bilateral lower legs. At some point he was referred to [name]. He was also noted to have previous noninvasive arterial studies that showed no flow to either one of his toes and noncompressible ABIs bilaterally. [name] did noninvasive arterial studies on him. These showed that he had greater than 75% stenosis in the proximal and mid common femoral artery. Monophasic flow in the posterior tibial and dorsalis pedis positions on the right foot. ABIs again were noncompressible. He was felt to have total occlusion of the superficial femoral artery on the right. The overall interpretation is that he had severe multilevel arterial occlusive disease. Absent femoral pulses and superficial femoral artery occlusions. In spite of this he was not felt to be candidate for arteriography given his bilateral femoral artery occlusions as well he would not be a candidate for endovascular approach secondary to his common femoral artery occlusions and proximal iliac disease. He was not felt to be a candidate for an open infra inguinal bypass. He therefore felt he would need to be monitored over time with the only option being a right below-knee amputation The patient complains currently of pain at night when he is up in bed. The pain in the right foot is better when he puts the leg down over the bed side. This is compatible with rest pain. He has very limited activity i.e. is even exhausted getting dressed in the morning because of cardiopulmonary issues therefore it is difficult to gauge claudication I believe he was referred here by Dr. [name] with one of the major issues is whether he would be a candidate for hyperbaric oxygen.  He is not a diabetic. Dr. [name] did give him Silvadene cream for dry flaking skin in his bilateral lower extremities, however the patient is  concerned about using this in the face of sulfa allergy. 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 This is on the medial aspect extended there is a second area on the plantar aspect. Most of what the patient talks about is claudication with minimal activity [however the patient is limited by his COPD], or even at rest at night. He is not getting a lot of sleep. We use silver collagen on this wound 5/4; the patient has a wound on the tip of his right great toe also on the medial aspect. Setting of severe nonrevascularizable PAD [see description in HPI]. He does not describe any change in his pain. We have been using silver collagen 5/18; 2-week follow-up. The patient has wound on the tip of his right great toe as well as the medial aspect. He has developed new wounds x2 on the right lateral foot and the right lateral calcaneus. These look like ischemic areas. The patient talks about claudication at night. He seems to be more comfortable during the day. We have been using silver collagen to the wounds. I have previously debrided the eschar on the toes however this comes right back. 6/1; 2-week follow-up. This patient has ischemic wounds on his right foot in the setting of nonunreconstructable PAD [previously reviewed by Dr. Dixon]. He does not really describe severe pain but he does have episodic pain in the right great toe. He has an area on the tip of the right great toe as well as the medial aspect of the right great toe. A new area on the tip of the right fourth toe. He has the area on the right lateral foot fortunately the area on the right lateral heel appears to have  epithelialized over. We are using silver collagen to all wounds 06/23/2019 upon evaluation today patient appears to be doing about the same in regard to his foot ulcers. Unfortunately he has 2 new skin tears on his left elbow and the right forearm that were new as of today. He states that it was doing okay until they went to change the dressing and they put some collagen on it that got stuck and somewhat pulled back the skin unfortunately. Fortunately there is no signs of active infection at this time. No fevers, chills, nausea, vomiting, or diarrhea. Objective Constitutional Well-nourished and well-hydrated in no acute distress. Vitals Time Taken: 3:52 PM, Height: 69 in, Weight: 132 lbs, BMI: 19.5, Temperature: 98.4 F, Pulse: 103 bpm, Respiratory Rate: 18 breaths/min, Blood Pressure: 157/80 mmHg. Respiratory normal breathing without difficulty. Psychiatric this patient is able to make decisions and demonstrates good insight into disease process. Alert and Oriented x 3. pleasant and cooperative. General Notes: Upon inspection patient's wounds actually appear to be doing okay he again is mainly eschar covered on the toe area which is okay at this point. I think Betadine we do quite well here. In regard to the foot I do suggest that we probably go ahead and debride is Dr. Dellia Nims has been doing I did review his note and I feel like  that based on his heel and how that progressed in the past that I agree that some light debridement here could benefit the patient and stimulate a new epithelial and granulation growth. That was performed today. Also did trim off some of the excess skin that was on the right forearm location and he tolerated that without any pain whatsoever in fact he had more pain on the foot ulcer at this point. Integumentary (Hair, Skin) Wound #10 status is Open. Original cause of wound was Trauma. The wound is located on the Left Elbow. The wound measures 3cm length x 3cm width  x 0.1cm depth; 7.069cm^2 area and 0.707cm^3 volume. There is no tunneling or undermining noted. There is a medium amount of serosanguineous drainage noted. There is large (67-100%) red granulation within the wound bed. There is no necrotic tissue within the wound bed. Wound #2 status is Open. Original cause of wound was Gradually Appeared. The wound is located on the Right T Great. The wound measures 0.3cm length x oe 0.5cm width x 0.1cm depth; 0.118cm^2 area and 0.012cm^3 volume. There is Fat Layer (Subcutaneous Tissue) Exposed exposed. There is no tunneling or undermining noted. There is a small amount of serosanguineous drainage noted. The wound margin is flat and intact. There is no granulation within the wound bed. There is a large (67-100%) amount of necrotic tissue within the wound bed including Eschar and Adherent Slough. Wound #4 status is Open. Original cause of wound was Gradually Appeared. The wound is located on the Right,Medial T Great. The wound measures 2cm oe length x 1cm width x 0.1cm depth; 1.571cm^2 area and 0.157cm^3 volume. There is no tunneling or undermining noted. There is a none present amount of drainage noted. The wound margin is flat and intact. There is no granulation within the wound bed. There is a large (67-100%) amount of necrotic tissue within the wound bed including Eschar. Wound #6 status is Open. Original cause of wound was Gradually Appeared. The wound is located on the Right,Distal,Lateral Foot. The wound measures 1.4cm length x 0.5cm width x 0.2cm depth; 0.55cm^2 area and 0.11cm^3 volume. There is no tunneling or undermining noted. There is a medium amount of serosanguineous drainage noted. The wound margin is flat and intact. There is no granulation within the wound bed. There is a large (67-100%) amount of necrotic tissue within the wound bed including Adherent Slough. Wound #8 status is Open. Original cause of wound was Gradually Appeared. The wound is  located on the Right T Fourth. The wound measures 0cm length x oe 0cm width x 0cm depth; 0cm^2 area and 0cm^3 volume. There is no tunneling or undermining noted. There is a none present amount of drainage noted. The wound margin is distinct with the outline attached to the wound base. There is no granulation within the wound bed. There is no necrotic tissue within the wound bed. Wound #9 status is Open. Original cause of wound was Trauma. The wound is located on the Right Forearm. The wound measures 3cm length x 2cm width x 0.1cm depth; 4.712cm^2 area and 0.471cm^3 volume. There is Fat Layer (Subcutaneous Tissue) Exposed exposed. There is no tunneling or undermining noted. There is a small amount of serosanguineous drainage noted. There is large (67-100%) red, pink granulation within the wound bed. There is a small (1-33%) amount of necrotic tissue within the wound bed including Adherent Slough. 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 There was a Selective/Open Wound Skin/Epidermis Debridement with a total area of 0.7 sq cm performed by Worthy Keeler, PA. With the following instrument(s): Forceps, and Scissors to remove Non-Viable tissue/material. Material removed includes Skin: Dermis and Skin: Epidermis and after achieving pain control using Lidocaine 4% Topical Solution. No specimens were taken. A time out was conducted at 16:10, prior to the  start of the procedure. A Minimum amount of bleeding was controlled with Pressure. The procedure was tolerated well with a pain level of 6 throughout and a pain level of 4 following the procedure. Post Debridement Measurements: 1.4cm length x 0.5cm width x 0.2cm depth; 0.11cm^3 volume. Character of Wound/Ulcer Post Debridement is improved. Severity of Tissue Post Debridement is: Fat layer exposed. Post procedure Diagnosis Wound #6: Same as Pre-Procedure Wound #9 Pre-procedure diagnosis of Wound #9 is a Skin T located on the Right Forearm . There was a Selective/Open Wound Skin/Epidermis Debridement with a total ear area of 1.5 sq cm performed by Worthy Keeler, PA. With the following instrument(s): Forceps, and Scissors to remove Non-Viable tissue/material. Material removed includes Skin: Dermis and Skin: Epidermis and after achieving pain control using Lidocaine 4% T opical Solution. No specimens were taken. A time out was conducted at 16:10, prior to the start of the procedure. A Minimum amount of bleeding was controlled with Pressure. The procedure was tolerated well with a pain level of 3 throughout and a pain level of 2 following the procedure. Post Debridement Measurements: 3cm length x 2cm width x 0.1cm depth; 0.471cm^3 volume. Character of Wound/Ulcer Post Debridement is improved. Post procedure Diagnosis Wound #9: Same as Pre-Procedure Plan Follow-up Appointments: Return Appointment in 2 weeks. Dressing Change Frequency: Wound #2 Right T Great: oe Change Dressing every other day. Skin Barriers/Peri-Wound Care: Moisturizing lotion - to both legs and feet daily TCA Cream or Ointment Wound Cleansing: May shower and wash wound with soap and water. - on days that dressing is changed Primary Wound Dressing: Wound #10 Left Elbow: Xeroform Wound #2 Right T Great: oe Other: - paint with betadine Wound #4 Right,Medial T Great: oe Other: - paint with betadine Wound #6  Right,Distal,Lateral Foot: Silver Collagen - moisten with hydrogel or KY jelly Wound #9 Right Forearm: Xeroform Secondary Dressing: Wound #10 Left Elbow: Foam Border - or gauze and rolled gauze Wound #2 Right T Great: oe Foam Kerlix/Rolled Gauze Dry Gauze Wound #4 Right,Medial T Great: oe Kerlix/Rolled Gauze Dry Gauze Wound #6 Right,Distal,Lateral Foot: Kerlix/Rolled Gauze Dry Gauze Heel Cup Wound #9 Right Forearm: Foam Border - or gauze and rolled gauze Off-Loading: Open toe surgical shoe to: - right foot 1. At this time would recommend that we continue with the collagen for the foot ulcer on the right I think that is appropriate. 2. I would recommend Betadine to the toe ulcers on the right as these are mainly dry really collagen has not needed anything but it stuck to the eschar which is what appears to happen today as well. I think the Betadine will do better to help this both stay clean and keep it from getting infected as well  as helping to dry up and hopefully heal from the outside in the morning. 3. I am also going to suggest at this point that we use Xeroform for the skin tears on the elbow and forearm I think that is good to be more appropriate and will prevent the dressing from sticking to the area. We will see patient back for reevaluation in 1 week here in the clinic. If anything worsens or changes patient will contact our office for additional recommendations. Electronic Signature(s) Signed: 06/23/2019 5:37:42 PM By: Worthy Keeler PA-C Entered By: Worthy Keeler on 06/23/2019 17:37:42 -------------------------------------------------------------------------------- SuperBill Details Patient Name: Date of Service: DO RN, [name] 06/23/2019 Medical Record Number: [id number] Patient Account Number: [id number] Date of Birth/Sex: Treating RN: Apr 08, 1929 (86 y.o. [name] Primary Care Provider: Piedad Climes, CHA RLES Other Clinician: Referring  Provider: Treating Provider/Extender: Worthy Keeler RO SS, CHA RLES Weeks in Treatment: 9 Diagnosis Coding ICD-10 Codes Code Description I70.235 Atherosclerosis of native arteries of right leg with ulceration of other part of foot L97.511 Non-pressure chronic ulcer of other part of right foot limited to breakdown of skin I87.323 Chronic venous hypertension (idiopathic) with inflammation of bilateral lower extremity S51.801A Unspecified open wound of right forearm, initial encounter S51.002A Unspecified open wound of left elbow, initial encounter Facility Procedures CPT4 Code: [id number] Description: [phone] - DEBRIDE WOUND 1ST 20 SQ CM OR < ICD-10 Diagnosis Description L97.511 Non-pressure chronic ulcer of other part of right foot limited to breakdown of s S51.801A Unspecified open wound of right forearm, initial encounter Modifier: kin Quantity: 1 Physician Procedures : CPT4 Code Description Modifier [id number] - WC PHYS DEBR WO ANESTH 20 SQ CM ICD-10 Diagnosis Description L97.511 Non-pressure chronic ulcer of other part of right foot limited to breakdown of skin S51.801A Unspecified open wound of right forearm,  initial encounter Quantity: 1 Electronic Signature(s) Signed: 06/23/2019 5:38:26 PM By: Worthy Keeler PA-C Previous Signature: 06/23/2019 5:38:02 PM Version By: Baruch Gouty RN, BSN Entered By: Worthy Keeler on 06/23/2019 17:38:25

## 2019-06-24 ENCOUNTER — Encounter (HOSPITAL_BASED_OUTPATIENT_CLINIC_OR_DEPARTMENT_OTHER): Payer: Medicare Other | Admitting: Internal Medicine

## 2019-06-24 ENCOUNTER — Telehealth: Payer: Self-pay | Admitting: Interventional Cardiology

## 2019-06-24 NOTE — Telephone Encounter (Signed)
Spoke with pt's wife and pt has edema and complaining of significant weeping Pt does have leg wound that was debrided yesterday Per pt's wife edema in right leg is worse than left Pt does watch salt intake Encouraged pt to elevate legs as much as possible Pt does have femoral blockage to right leg Pt is taking Furosemide 40 mg every day instead of 20 mg ad and 40 mg MWF and 1/2 of spironolactone qd.Will forward to Dr Tamala Julian for review .[name]

## 2019-06-24 NOTE — Telephone Encounter (Signed)
New Message:   On yesterday pt had one of his ulcer on his leg debrided. Today it is weeping a lot., it is clear fluid.day. Please call to evaluate.Marland Kitchen

## 2019-06-25 ENCOUNTER — Ambulatory Visit: Payer: Medicare Other

## 2019-06-25 ENCOUNTER — Encounter: Payer: Self-pay | Admitting: Nurse Practitioner

## 2019-06-25 ENCOUNTER — Ambulatory Visit (INDEPENDENT_AMBULATORY_CARE_PROVIDER_SITE_OTHER): Payer: Medicare Other | Admitting: Nurse Practitioner

## 2019-06-25 ENCOUNTER — Other Ambulatory Visit: Payer: Self-pay | Admitting: *Deleted

## 2019-06-25 ENCOUNTER — Ambulatory Visit: Payer: Medicare Other | Admitting: Podiatry

## 2019-06-25 ENCOUNTER — Other Ambulatory Visit: Payer: Self-pay

## 2019-06-25 VITALS — BP 150/80 | HR 110 | Ht 69.0 in | Wt 138.0 lb

## 2019-06-25 DIAGNOSIS — I779 Disorder of arteries and arterioles, unspecified: Secondary | ICD-10-CM | POA: Diagnosis not present

## 2019-06-25 DIAGNOSIS — I25709 Atherosclerosis of coronary artery bypass graft(s), unspecified, with unspecified angina pectoris: Secondary | ICD-10-CM

## 2019-06-25 DIAGNOSIS — I25810 Atherosclerosis of coronary artery bypass graft(s) without angina pectoris: Secondary | ICD-10-CM

## 2019-06-25 DIAGNOSIS — I5032 Chronic diastolic (congestive) heart failure: Secondary | ICD-10-CM | POA: Diagnosis not present

## 2019-06-25 DIAGNOSIS — R600 Localized edema: Secondary | ICD-10-CM

## 2019-06-25 DIAGNOSIS — Z79899 Other long term (current) drug therapy: Secondary | ICD-10-CM

## 2019-06-25 MED ORDER — FUROSEMIDE 20 MG PO TABS: ORAL_TABLET | ORAL | 3 refills | Status: DC

## 2019-06-25 MED ORDER — FUROSEMIDE 20 MG PO TABS: 20.0000 mg | ORAL_TABLET | Freq: Two times a day (BID) | ORAL | 3 refills | Status: DC

## 2019-06-25 NOTE — Progress Notes (Signed)
[name], [name] ([id number]) Visit Report for 06/23/2019 Arrival Information Details Patient Name: Date of Service: DO RN, [place] V ID L. 06/23/2019 2:15 PM Medical Record Number: [id number] Patient Account Number: [id number] Date of Birth/Sex: Treating RN: 08/02/1929 (86 y.o. [name]) [name] Primary Care Zniya Cottone: Piedad Climes, CHA RLES Other Clinician: Referring Ronal Maybury: Treating Octave Montrose/Extender: Worthy Keeler RO SS, CHA RLES Weeks in Treatment: 9 Visit Information History Since Last Visit All ordered tests and consults were completed: No Patient Arrived: Wheel Chair Added or deleted any medications: No Arrival Time: 15:51 Any new allergies or adverse reactions: No Accompanied By: wife Had a fall or experienced change in No Transfer Assistance: None activities of daily living that may affect Patient Identification Verified: Yes risk of falls: Secondary Verification Process Completed: Yes Signs or symptoms of abuse/neglect since last visito No Patient Requires Transmission-Based Precautions: No Hospitalized since last visit: No Patient Has Alerts: Yes Implantable device outside of the clinic excluding No Patient Alerts: Right ABI: [place], TBI: 0 cellular tissue based products placed in the center Left ABI: [place], TBI:0 since last visit: Has Dressing in Place as Prescribed: Yes Pain Present Now: No Electronic Signature(s) Signed: 06/25/2019 9:54:21 AM By: [name] RN Entered By: [name] on 06/23/2019 15:52:07 -------------------------------------------------------------------------------- Encounter Discharge Information Details Patient Name: Date of Service: DO RN, [name] 06/23/2019 2:15 PM Medical Record Number: [id number] Patient Account Number: [id number] Date of Birth/Sex: Treating RN: 1929-01-19 (86 y.o. [name] Primary Care Jasman Murri: Piedad Climes, CHA RLES Other Clinician: Referring Tylisha Danis: Treating Marnae Madani/Extender: Worthy Keeler RO SS, CHA RLES Weeks in  Treatment: 9 Encounter Discharge Information Items Post Procedure Vitals Discharge Condition: Stable Temperature (F): 98.4 Ambulatory Status: Wheelchair Pulse (bpm): 103 Discharge Destination: Home Respiratory Rate (breaths/min): 18 Transportation: Private Auto Blood Pressure (mmHg): 157/80 Accompanied By: wife Schedule Follow-up Appointment: Yes Clinical Summary of Care: Patient Declined Electronic Signature(s) Signed: 06/23/2019 5:37:40 PM By: Kela Millin Entered By: Kela Millin on 06/23/2019 17:24:27 -------------------------------------------------------------------------------- Lower Extremity Assessment Details Patient Name: Date of Service: DO RN, [name] 06/23/2019 2:15 PM Medical Record Number: [id number] Patient Account Number: [id number] Date of Birth/Sex: Treating RN: Nov 30, 1929 (86 y.o. [name]) [name] Primary Care Cortlan Dolin: Piedad Climes, CHA RLES Other Clinician: Referring Gelene Recktenwald: Treating Brittney Mucha/Extender: Worthy Keeler RO SS, CHA RLES Weeks in Treatment: 9 Edema Assessment Assessed: [Left: No] [Right: No] Edema: [Left: Ye] [Right: s] Calf Left: Right: Point of Measurement: cm From Medial Instep cm 35 cm Ankle Left: Right: Point of Measurement: cm From Medial Instep cm 26 cm Electronic Signature(s) Signed: 06/25/2019 9:54:21 AM By: [name] RN Entered By: [name] on 06/23/2019 15:53:25 -------------------------------------------------------------------------------- Multi-Disciplinary Care Plan Details Patient Name: Date of Service: DO RN, [name] 06/23/2019 2:15 PM Medical Record Number: [id number] Patient Account Number: [id number] Date of Birth/Sex: Treating RN: 05/08/1929 (86 y.o. [name] Primary Care Ruthe Roemer: Piedad Climes, CHA RLES Other Clinician: Referring Valda Christenson: Treating Rella Egelston/Extender: Worthy Keeler RO SS, CHA RLES Weeks in Treatment: 9 Active Inactive Wound/Skin Impairment Nursing Diagnoses: Impaired  tissue integrity Knowledge deficit related to ulceration/compromised skin integrity Goals: Patient/caregiver will verbalize understanding of skin care regimen Date Initiated: 04/21/2019 Target Resolution Date: 07/21/2019 Goal Status: Active Ulcer/skin breakdown will have a volume reduction of 30% by week 4 Date Initiated: 04/21/2019 Date Inactivated: 05/27/2019 Target Resolution Date: 05/23/2019 Goal Status: Met Ulcer/skin breakdown will have a volume reduction of 50% by week 8 Date Initiated: 05/27/2019 Date Inactivated: 06/23/2019 Target  Resolution Date: 06/23/2019 Goal Status: Unmet Unmet Reason: vascular disease Ulcer/skin breakdown will have a volume reduction of 80% by week 12 Date Initiated: 06/23/2019 Target Resolution Date: 07/21/2019 Goal Status: Active Interventions: Assess patient/caregiver ability to obtain necessary supplies Assess patient/caregiver ability to perform ulcer/skin care regimen upon admission and as needed Assess ulceration(s) every visit Provide education on ulcer and skin care Notes: Electronic Signature(s) Signed: 06/23/2019 5:38:02 PM By: Baruch Gouty RN, BSN Entered By: Baruch Gouty on 06/23/2019 16:05:26 -------------------------------------------------------------------------------- Pain Assessment Details Patient Name: Date of Service: DO RN, [name] 06/23/2019 2:15 PM Medical Record Number: [id number] Patient Account Number: [id number] Date of Birth/Sex: Treating RN: 04/06/1929 (86 y.o. [name]) [name] Primary Care Willow Reczek: Piedad Climes, CHA RLES Other Clinician: Referring Anajulia Leyendecker: Treating Jayveon Convey/Extender: Worthy Keeler RO SS, CHA RLES Weeks in Treatment: 9 Active Problems Location of Pain Severity and Description of Pain Patient Has Paino No Site Locations Pain Management and Medication Current Pain Management: Electronic Signature(s) Signed: 06/25/2019 9:54:21 AM By: [name] RN Entered By: [name] on 06/23/2019  15:53:18 -------------------------------------------------------------------------------- Patient/Caregiver Education Details Patient Name: Date of Service: DO RN, [name] 6/14/2021andnbsp2:15 PM Medical Record Number: [id number] Patient Account Number: [id number] Date of Birth/Gender: Treating RN: 1929-08-04 (86 y.o. [name] Primary Care Physician: Piedad Climes, CHA RLES Other Clinician: Referring Physician: Treating Physician/Extender: Worthy Keeler RO SS, CHA RLES Weeks in Treatment: 9 Education Assessment Education Provided To: Patient Education Topics Provided Wound/Skin Impairment: Methods: Explain/Verbal Responses: Reinforcements needed, State content correctly Electronic Signature(s) Signed: 06/23/2019 5:38:02 PM By: Baruch Gouty RN, BSN Entered By: Baruch Gouty on 06/23/2019 16:05:57 -------------------------------------------------------------------------------- Wound Assessment Details Patient Name: Date of Service: DO RN, [name] 06/23/2019 2:15 PM Medical Record Number: [id number] Patient Account Number: [id number] Date of Birth/Sex: Treating RN: 07-29-1929 (86 y.o. [name]) [name] Primary Care Thaddeaus Monica: Piedad Climes, CHA RLES Other Clinician: Referring Norine Reddington: Treating Bernerd Terhune/Extender: Worthy Keeler RO SS, CHA RLES Weeks in Treatment: 9 Wound Status Wound Number: 10 Primary Skin T ear Etiology: Wound Location: Left Elbow Wound Open Wounding Event: Trauma Status: Date Acquired: 06/21/2019 Comorbid Cataracts, Asthma, Chronic Obstructive Pulmonary Disease Weeks Of Treatment: 0 History: (COPD), Congestive Heart Failure, Coronary Artery Disease, Clustered Wound: No Hypertension, Peripheral Arterial Disease, Osteoarthritis, Confinement Anxiety Photos Photo Uploaded By: Mikeal Hawthorne on 06/24/2019 15:55:29 Wound Measurements Length: (cm) 3 Width: (cm) 3 Depth: (cm) 0.1 Area: (cm) 7.069 Volume: (cm) 0.707 % Reduction in Area: %  Reduction in Volume: Epithelialization: None Tunneling: No Undermining: No Wound Description Classification: Full Thickness Without Exposed Support Structures Exudate Amount: Medium Exudate Type: Serosanguineous Exudate Color: red, brown Foul Odor After Cleansing: No Slough/Fibrino No Wound Bed Granulation Amount: Large (67-100%) Exposed Structure Granulation Quality: Red Fascia Exposed: No Necrotic Amount: None Present (0%) Fat Layer (Subcutaneous Tissue) Exposed: No Tendon Exposed: No Muscle Exposed: No Joint Exposed: No Bone Exposed: No Treatment Notes Wound #10 (Left Elbow) 1. Cleanse With Wound Cleanser 3. Primary Dressing Applied Xeroform Gauze 4. Secondary Dressing Foam Border Dressing Electronic Signature(s) Signed: 06/25/2019 9:54:21 AM By: [name] RN Entered By: [name] on 06/23/2019 16:09:48 -------------------------------------------------------------------------------- Wound Assessment Details Patient Name: Date of Service: DO RN, [name] 06/23/2019 2:15 PM Medical Record Number: [id number] Patient Account Number: [id number] Date of Birth/Sex: Treating RN: 11/13/1929 (86 y.o. [name]) [name] Primary Care Zannie Runkle: Piedad Climes, CHA RLES Other Clinician: Referring Mayda Shippee: Treating Cindy Fullman/Extender: Worthy Keeler RO SS, CHA RLES Weeks in Treatment: 9  Wound Status Wound Number: 2 Primary Arterial Insufficiency Ulcer Etiology: Wound Location: Right T Great oe Wound Open Wounding Event: Gradually Appeared Status: Date Acquired: 03/31/2019 Comorbid Cataracts, Asthma, Chronic Obstructive Pulmonary Disease Weeks Of Treatment: 9 History: (COPD), Congestive Heart Failure, Coronary Artery Disease, Clustered Wound: No Hypertension, Peripheral Arterial Disease, Osteoarthritis, Confinement Anxiety Photos Photo Uploaded By: Mikeal Hawthorne on 06/24/2019 15:53:47 Wound Measurements Length: (cm) 0.3 Width: (cm) 0.5 Depth: (cm) 0.1 Area: (cm)  0.118 Volume: (cm) 0.012 % Reduction in Area: 90.9% % Reduction in Volume: 90.8% Epithelialization: Medium (34-66%) Tunneling: No Undermining: No Wound Description Classification: Full Thickness Without Exposed Support Structures Wound Margin: Flat and Intact Exudate Amount: Small Exudate Type: Serosanguineous Exudate Color: red, brown Foul Odor After Cleansing: No Slough/Fibrino Yes Wound Bed Granulation Amount: None Present (0%) Exposed Structure Necrotic Amount: Large (67-100%) Fascia Exposed: No Necrotic Quality: Eschar, Adherent Slough Fat Layer (Subcutaneous Tissue) Exposed: Yes Tendon Exposed: No Muscle Exposed: No Joint Exposed: No Bone Exposed: No Treatment Notes Wound #2 (Right Toe Great) 1. Cleanse With Wound Cleanser 3. Primary Dressing Applied Other primary dressing (specifiy in notes) 4. Secondary Dressing Dry Gauze Roll Gauze 5. Secured With Tape Notes paint with betadine, netting Electronic Signature(s) Signed: 06/25/2019 9:54:21 AM By: [name] RN Entered By: [name] on 06/23/2019 16:00:30 -------------------------------------------------------------------------------- Wound Assessment Details Patient Name: Date of Service: DO RN, [name] 06/23/2019 2:15 PM Medical Record Number: [id number] Patient Account Number: [id number] Date of Birth/Sex: Treating RN: [birth year] (86 y.o. [name]) [name] Primary Care [name]: Piedad Climes, CHA RLES Other Clinician: Referring Quintella Mura: Treating Kaleab Frasier/Extender: Worthy Keeler RO SS, CHA RLES Weeks in Treatment: 9 Wound Status Wound Number: 4 Primary Arterial Insufficiency Ulcer Etiology: Wound Location: Right, Medial T Great oe Wound Open Wounding Event: Gradually Appeared Status: Date Acquired: 05/13/2019 Comorbid Cataracts, Asthma, Chronic Obstructive Pulmonary Disease Weeks Of Treatment: 5 History: (COPD), Congestive Heart Failure, Coronary Artery Disease, Clustered Wound: No Hypertension,  Peripheral Arterial Disease, Osteoarthritis, Confinement Anxiety Photos Photo Uploaded By: Mikeal Hawthorne on 06/24/2019 15:54:03 Wound Measurements Length: (cm) 2 Width: (cm) 1 Depth: (cm) 0.1 Area: (cm) 1.571 Volume: (cm) 0.157 % Reduction in Area: -2.1% % Reduction in Volume: -1.9% Epithelialization: Small (1-33%) Tunneling: No Undermining: No Wound Description Classification: Full Thickness Without Exposed Support Structures Wound Margin: Flat and Intact Exudate Amount: None Present Foul Odor After Cleansing: No Slough/Fibrino Yes Wound Bed Granulation Amount: None Present (0%) Exposed Structure Necrotic Amount: Large (67-100%) Fascia Exposed: No Necrotic Quality: Eschar Fat Layer (Subcutaneous Tissue) Exposed: No Tendon Exposed: No Muscle Exposed: No Joint Exposed: No Bone Exposed: No Treatment Notes Wound #4 (Right, Medial Toe Great) 1. Cleanse With Wound Cleanser 3. Primary Dressing Applied Other primary dressing (specifiy in notes) 4. Secondary Dressing Dry Gauze Roll Gauze 5. Secured With Tape Notes paint with betadine, netting Electronic Signature(s) Signed: 06/25/2019 9:54:21 AM By: [name] RN Entered By: [name] on 06/23/2019 16:00:56 -------------------------------------------------------------------------------- Wound Assessment Details Patient Name: Date of Service: DO RN, [name] 06/23/2019 2:15 PM Medical Record Number: [id number] Patient Account Number: [id number] Date of Birth/Sex: Treating RN: 1929-07-04 (86 y.o. [name]) [name] Primary Care Taresa [place]: Piedad Climes, CHA RLES Other Clinician: Referring Shyam Dawson: Treating Irvan Tiedt/Extender: Worthy Keeler RO SS, CHA RLES Weeks in Treatment: 9 Wound Status Wound Number: 6 Primary Arterial Insufficiency Ulcer Etiology: Wound Location: Right, Distal, Lateral Foot Wound Open Wounding Event: Gradually Appeared Status: Date Acquired: 05/23/2019 Comorbid Cataracts, Asthma, Chronic  Obstructive Pulmonary Disease Weeks Of  Treatment: 3 History: (COPD), Congestive Heart Failure, Coronary Artery Disease, Clustered Wound: No Hypertension, Peripheral Arterial Disease, Osteoarthritis, Confinement Anxiety Photos Photo Uploaded By: Mikeal Hawthorne on 06/24/2019 15:55:29 Wound Measurements Length: (cm) 1.4 Width: (cm) 0.5 Depth: (cm) 0.2 Area: (cm) 0.55 Volume: (cm) 0.11 % Reduction in Area: -75.2% % Reduction in Volume: -254.8% Epithelialization: None Tunneling: No Undermining: No Wound Description Classification: Unclassifiable Wound Margin: Flat and Intact Exudate Amount: Medium Exudate Type: Serosanguineous Exudate Color: red, brown Foul Odor After Cleansing: No Slough/Fibrino Yes Wound Bed Granulation Amount: None Present (0%) Exposed Structure Necrotic Amount: Large (67-100%) Fascia Exposed: No Necrotic Quality: Adherent Slough Fat Layer (Subcutaneous Tissue) Exposed: No Tendon Exposed: No Muscle Exposed: No Joint Exposed: No Bone Exposed: No Treatment Notes Wound #6 (Right, Distal, Lateral Foot) 1. Cleanse With Wound Cleanser 3. Primary Dressing Applied [employer] 4. Secondary Dressing Dry Gauze Roll Gauze Heel Cup 5. Secured With Recruitment consultant) Signed: 06/25/2019 9:54:21 AM By: [name] RN Entered By: [name] on 06/23/2019 16:01:22 -------------------------------------------------------------------------------- Wound Assessment Details Patient Name: Date of Service: DO RN, [name] 06/23/2019 2:15 PM Medical Record Number: [id number] Patient Account Number: [id number] Date of Birth/Sex: Treating RN: [birth year] (86 y.o. [name]) [name] Primary Care Lucylle Foulkes: Piedad Climes, CHA RLES Other Clinician: Referring Kumari Sculley: Treating Bethany Hirt/Extender: Worthy Keeler RO SS, CHA RLES Weeks in Treatment: 9 Wound Status Wound Number: 8 Primary Arterial Insufficiency Ulcer Etiology: Wound Location: Right T Fourth oe Wound  Open Wounding Event: Gradually Appeared Status: Date Acquired: 06/03/2019 Comorbid Cataracts, Asthma, Chronic Obstructive Pulmonary Disease Weeks Of Treatment: 1 History: (COPD), Congestive Heart Failure, Coronary Artery Disease, Clustered Wound: No Hypertension, Peripheral Arterial Disease, Osteoarthritis, Confinement Anxiety Photos Photo Uploaded By: Mikeal Hawthorne on 06/24/2019 15:54:54 Wound Measurements Length: (cm) Width: (cm) Depth: (cm) Area: (cm) Volume: (cm) 0 % Reduction in Area: 100% 0 % Reduction in Volume: 100% 0 Epithelialization: Large (67-100%) 0 Tunneling: No 0 Undermining: No Wound Description Classification: Unclassifiable Wound Margin: Distinct, outline attached Exudate Amount: None Present Foul Odor After Cleansing: No Slough/Fibrino No Wound Bed Granulation Amount: None Present (0%) Exposed Structure Necrotic Amount: None Present (0%) Fascia Exposed: No Fat Layer (Subcutaneous Tissue) Exposed: No Tendon Exposed: No Muscle Exposed: No Joint Exposed: No Bone Exposed: No Electronic Signature(s) Signed: 06/25/2019 9:54:21 AM By: [name] RN Entered By: [name] on 06/23/2019 16:02:16 -------------------------------------------------------------------------------- Wound Assessment Details Patient Name: Date of Service: DO RN, [name] 06/23/2019 2:15 PM Medical Record Number: [id number] Patient Account Number: [id number] Date of Birth/Sex: Treating RN: [birth year] (86 y.o. [name]) [name] Primary Care Evian Salguero: Piedad Climes, CHA RLES Other Clinician: Referring Zareya Tuckett: Treating Benyamin Jeff/Extender: Worthy Keeler RO SS, CHA RLES Weeks in Treatment: 9 Wound Status Wound Number: 9 Primary Skin T ear Etiology: Wound Location: Right Forearm Wound Open Wounding Event: Trauma Status: Date Acquired: 06/21/2019 Comorbid Cataracts, Asthma, Chronic Obstructive Pulmonary Disease Weeks Of Treatment: 0 History: (COPD), Congestive Heart Failure,  Coronary Artery Disease, Clustered Wound: No Hypertension, Peripheral Arterial Disease, Osteoarthritis, Confinement Anxiety Photos Photo Uploaded By: Mikeal Hawthorne on 06/24/2019 15:54:54 Wound Measurements Length: (cm) 3 Width: (cm) 2 Depth: (cm) 0.1 Area: (cm) 4.712 Volume: (cm) 0.471 % Reduction in Area: 33.3% % Reduction in Volume: 33.4% Tunneling: No Undermining: No Wound Description Classification: Full Thickness Without Exposed Support Structures Exudate Amount: Small Exudate Type: Serosanguineous Exudate Color: red, brown Foul Odor After Cleansing: No Slough/Fibrino Yes Wound Bed Granulation Amount: Large (67-100%) Exposed Structure Granulation Quality: Red, Pink Fascia  Exposed: No Necrotic Amount: Small (1-33%) Fat Layer (Subcutaneous Tissue) Exposed: Yes Necrotic Quality: Adherent Slough Tendon Exposed: No Muscle Exposed: No Joint Exposed: No Bone Exposed: No Treatment Notes Wound #9 (Right Forearm) 1. Cleanse With Wound Cleanser 3. Primary Dressing Applied Xeroform Gauze 4. Secondary Dressing Foam Border Dressing Electronic Signature(s) Signed: 06/25/2019 9:54:21 AM By: [name] RN Entered By: [name] on 06/23/2019 16:05:29 -------------------------------------------------------------------------------- Vitals Details Patient Name: Date of Service: DO RN, [name] 06/23/2019 2:15 PM Medical Record Number: [id number] Patient Account Number: [id number] Date of Birth/Sex: Treating RN: February 06, 1929 (86 y.o. [name]) [name] Primary Care Makaiyah Schweiger: Piedad Climes, CHA RLES Other Clinician: Referring Bonni Neuser: Treating Kemet Nijjar/Extender: Worthy Keeler RO SS, CHA RLES Weeks in Treatment: 9 Vital Signs Time Taken: 15:52 Temperature (F): 98.4 Height (in): 69 Pulse (bpm): 103 Weight (lbs): 132 Respiratory Rate (breaths/min): 18 Body Mass Index (BMI): 19.5 Blood Pressure (mmHg): 157/80 Reference Range: 80 - 120 mg / dl Electronic  Signature(s) Signed: 06/25/2019 9:54:21 AM By: [name] RN Entered By: [name] on 06/23/2019 15:53:11

## 2019-06-25 NOTE — Patient Instructions (Addendum)
After Visit Summary:  We will be checking the following labs today - BMET and CBC   Medication Instructions:    Continue with your current medicines. BUT  I am increasing the Lasix to 20 mg twice a day every day.    If you need a refill on your cardiac medications before your next appointment, please call your pharmacy.     Testing/Procedures To Be Arranged:  N/A  Follow-Up:   See Dr. Scot Dock next month as planned    At [facility], you and your health needs are our priority.  As part of our continuing mission to provide you with exceptional heart care, we have created designated Provider Care Teams.  These Care Teams include your primary Cardiologist (physician) and Advanced Practice Providers (APPs -  Physician Assistants and Nurse Practitioners) who all work together to provide you with the care you need, when you need it.  Special Instructions:  . Stay safe, wash your hands for at least 20 seconds and wear a mask when needed.  . It was good to talk with you today.  . Talk to Dr. Harrington Challenger about possible Jeral Pinch to start the Zoloft . Would consider Hospice referral   Call the [place] office at [phone] if you have any questions, problems or concerns.

## 2019-06-25 NOTE — Telephone Encounter (Signed)
Take furosemide 40 mg daily. Increase the Spironolactone to 25 mg daily. BMET 5-7 days. Measure BP and notify if persistently < 90 mmHg systolic.

## 2019-06-25 NOTE — Telephone Encounter (Signed)
[name] aware of recommendations Pt had appt today with [name] NP and med changes were made Per wife if finds that the changes made today make no improvement will increase the Spironolactone to 25 mg and will have bmet in 5-7 days .[name] verbalizes  Understanding./cy

## 2019-06-25 NOTE — Progress Notes (Signed)
CARDIOLOGY OFFICE NOTE  Date:  06/25/2019    [name] Date of Birth: 11/18/1929 Medical Record #[id number]  PCP:  [name], MD  Cardiologist:  Tamala Julian  Chief Complaint  Patient presents with  . Edema    Seen for Dr. Tamala Julian    History of Present Illness: [name] is a 86 y.o. male who presents today for a work in visit. Seen for Dr. Tamala Julian.   He has a known history of CAD with prior CABG, ICM, severe COPD with cor pulmonale, HTN, HLD, PAF, PAD and carotid artery disease.Echocardiogram obtained on 10/08/2018 showed EF>55%, bicuspid aortic valve. He has had prior ischemic right right foot.  Last seen by Dr. Tamala Julian in April - lots of pain in the lower extremities. Ischemic ulcer on the right great toe. No interventional approaches are possible given his COPD, hypoxic state and ICM. Noted to be pretty desperate.   Phone call from earlier today - "Spoke with pt's wife and pt has edema and complaining of significant weeping Pt does have leg wound that was debrided yesterday Per pt's wife edema in right leg is worse than left Pt does watch salt intake Encouraged pt to elevate legs as much as possible Pt does have femoral blockage to right leg Pt is taking Furosemide 40 mg every day instead of 20 mg ad and 40 mg MWF and 1/2 of spironolactone qd.Will forward to Dr Tamala Julian for review ./cy"  Thus added to my schedule for today.   The patient does not have symptoms concerning for COVID-19 infection (fever, chills, cough, or new shortness of breath).   Comes in today. Here with his wife. Lots of issues. Really down hill spiral over the past few weeks. More weeping of his legs. On lasix twice a day - some help. Has had debridement per wound clinic. This area is now weeping. This was from a new place/ucler that has developed - now that is leaking. Wife is doing lots of dressing changes. He is tired and weak. She is tired. No real help othr than his wife. Not doing well with  exercise. He has had a fall last week. Cut up his elbows. Lots of anxiety. Appetite ok. Not sleeping due to extreme leg pain - dangles his legs for some relief but certainly no restful sleep. He is not on any pain medicines. He admits he is anxious today. His right great toe has improved per his wife. He just does not understand why something can't be done for him. He remains very desperate. He has follow up with Dr. Scot Dock in early July. Asking about checking cortisol levels, vit D, if more exercise would help. He does not want to be on narcotics - he wants to "be in control".   Past Medical History:  Diagnosis Date  . Arthritis    "generalized" (04/04/2017)  . Asthma   . Basal cell carcinoma    "left ear"  . BPH (benign prostatic hyperplasia)    severe; s/p multiple biopsies  . CAD (coronary artery disease)   . Carotid artery occlusion   . Chronic respiratory failure ([place])   . Chronic rhinitis   . COPD (chronic obstructive pulmonary disease) ([facility])    2L [place] O2  . Diastolic heart failure ([place]) 2019  . Dilation of biliary tract   . Elevated troponin 03/09/2017  . Gallstones   . GERD (gastroesophageal reflux disease)   . History of blood transfusion    "w/his CABG" (  04/04/2017)  . History of kidney stones   . Hyperlipidemia   . Hypertension   . On home oxygen therapy    "~ 24/7" (04/04/2017)  . Peripheral vascular disease ([place])   . Pneumonia 2019  . Squamous cell carcinoma of skin 04/23/2013   in situ-Right flank (txpbx)  . Squamous cell carcinoma of skin 01/23/2017   [place]/ in siut- Left ear rim (CX3FU)  . Syncope and collapse   . Ureteral tumor 08/2015   had endoscopic procedure for evaluation, unable to reach for biopsy    Past Surgical History:  Procedure Laterality Date  . BASAL CELL CARCINOMA EXCISION Left    ear  . CARDIAC CATHETERIZATION     "prior to bypass"  . CATARACT EXTRACTION W/ INTRAOCULAR LENS  IMPLANT, BILATERAL Bilateral   . CORONARY ARTERY BYPASS GRAFT   10/2001   LIMA to LAD, SVG to OM1-2, SVG to RCA and PDA  . LITHOTRIPSY    . PROSTATE BIOPSY  Oct. 2014  . TONSILLECTOMY       Medications: Current Meds  Medication Sig  . ALPRAZolam (XANAX) 0.25 MG tablet TAKE 1 TABLET BY MOUTH THREE TIMES DAILY AS NEEDED  . aspirin 81 MG tablet Take 81 mg by mouth daily.  Marland Kitchen atorvastatin (LIPITOR) 20 MG tablet TAKE 1 TABLET BY MOUTH EVERY DAY  . betamethasone dipropionate 0.05 % cream Apply topically 2 (two) times daily. Apply to both feet twice daily for 4 weeks.  . Cholecalciferol (VITAMIN D3) 2000 units TABS Take 2,000 Units by mouth daily.  . famotidine (PEPCID) 20 MG tablet Take by mouth.  . finasteride (PROSCAR) 5 MG tablet Take 5 mg by mouth every morning.  . fluticasone (FLONASE) 50 MCG/ACT nasal spray Place 2 sprays into both nostrils daily.  . furosemide (LASIX) 20 MG tablet Take 1 tablet (20 mg total) by mouth 2 (two) times daily.  Marland Kitchen levalbuterol (XOPENEX HFA) 45 MCG/ACT inhaler Inhale 2 puffs into the lungs every 4 (four) hours as needed for wheezing or shortness of breath.  . levalbuterol (XOPENEX) 0.63 MG/3ML nebulizer solution USE 1 VIAL VIA NEBULIZER EVERY 4 HOURS AS NEEDED FOR WHEEZING OR SHORTNESS OF BREATH  . metoprolol succinate (TOPROL-XL) 25 MG 24 hr tablet Take 1 tablet (25 mg total) by mouth daily.  . mometasone (NASONEX) 50 MCG/ACT nasal spray Place 2 sprays into the nose daily.  . Multiple Vitamins-Minerals (PRESERVISION AREDS 2) CAPS Take 1 capsule by mouth daily.  . nitroGLYCERIN (NITROSTAT) 0.4 MG SL tablet Place 1 tablet (0.4 mg total) under the tongue every 5 (five) minutes as needed for chest pain.  . OXYGEN Inhale 2 L continuous into the lungs.   . predniSONE (DELTASONE) 10 MG tablet FOR FLARE OF COUGH/WHEEZING MAY INCREASE TO 2 DAILY BETTER, THEN 1 DAILY  . sodium chloride (MURO 128) 5 % ophthalmic ointment 1 drop.   Marland Kitchen spironolactone (ALDACTONE) 25 MG tablet TAKE 1/2 TABLET BY MOUTH EVERY DAY  . SYMBICORT 160-4.5  MCG/ACT inhaler Inhale 2 puffs into the lungs 2 (two) times daily.  . Tiotropium Bromide Monohydrate (SPIRIVA RESPIMAT) 2.5 MCG/ACT AERS INHALE 2 PUFFS BY MOUTH EVERY DAY  . vitamin B-12 (CYANOCOBALAMIN) 1000 MCG tablet Take 1,000 mcg by mouth daily.   . [DISCONTINUED] furosemide (LASIX) 20 MG tablet Take 20 mg by mouth daily. Take 20 mg daily and an additional 20 mg on MWF afternoons   . [DISCONTINUED] furosemide (LASIX) 20 MG tablet Take 20 mg by mouth daily  . [DISCONTINUED] furosemide (LASIX)  20 MG tablet Take 1 tablet (20 mg total) by mouth 2 (two) times daily. Take 20 mg by mouth daily     Allergies: Allergies  Allergen Reactions  . Cefdinir Other (See Comments)    Severe Diarrhea  . Nitrofurantoin Swelling    Hand Swelling  . Sulfa Antibiotics Anaphylaxis and Swelling  . Sulfonamide Derivatives Swelling    REACTION: facial/tongue swelling  . Cefdinir Diarrhea    REACTION: diarrhea  . Ciprofloxacin Itching and Rash    Red itchy hands  . Nitrofurantoin Swelling    Swollen hands  . Amoxicillin Er     frequest urination  . Doxycycline Other (See Comments)    Felt terrible  . Sertraline Other (See Comments)    jittery  . Ciprofloxacin Itching and Rash  . Levaquin [Levofloxacin] Itching and Rash  . Levaquin [Levofloxacin] Itching and Rash    Social History: The patient  reports that he quit smoking about 37 years ago. His smoking use included cigarettes. He has a 66.00 pack-year smoking history. He has never used smokeless tobacco. He reports current alcohol use. He reports that he does not use drugs.   Family History: The patient's family history includes Allergies in his brother; Breast cancer in his mother; Cancer in his mother; Heart attack in his father; Heart disease in his brother, brother, father, and mother; Hyperlipidemia in his brother; Hypertension in his brother and mother; Other in his mother; Peripheral vascular disease in his brother; Stroke in his brother.    Review of Systems: Please see the history of present illness.   All other systems are reviewed and negative.   Physical Exam: VS:  BP (!) 150/80   Pulse (!) 110   Ht 5\' 9"  (1.753 m)   Wt 138 lb (62.6 kg)   BMI 20.38 kg/m  .  BMI Body mass index is 20.38 kg/m.  Wt Readings from Last 3 Encounters:  06/25/19 138 lb (62.6 kg)  05/19/19 138 lb (62.6 kg)  04/23/19 137 lb 12.8 oz (62.5 kg)    General: Chronically ill appearing. He is alert - very hard of hearing. He is in a wheelchair. Admits he is anxious.    Cardiac: Irregular irregular rhythm. His rate was about 110 when he first came in - settled down to the 90's. He has over 1 to 2+ edema - the right lower foot is red/weeping.  Respiratory:  Lungs are clear to auscultation bilaterally with fairly normal work of breathing.  GI: Soft and nontender.  MS: No deformity or atrophy. Gait not noted. He is in a wheelchair.   Skin: Warm and dry. Color is sallow. Neuro:  Strength and sensation are intact and no gross focal deficits noted. But generalized weakness.  Psych: Alert, appropriate and with normal affect.   LABORATORY DATA:  EKG:  EKG was ordered today. Personally reviewed by me - sinus with PACs - he is not in AF fortunately.    Lab Results  Component Value Date   WBC 11.4 (H) 12/20/2018   HGB 13.1 12/20/2018   HCT 39.1 12/20/2018   PLT 230 12/20/2018   GLUCOSE 123 (H) 03/27/2019   CHOL 146 03/26/2017   TRIG 34 03/26/2017   HDL 79 03/26/2017   LDLCALC 60 03/26/2017   ALT 23 03/05/2019   AST 27 03/05/2019   NA 142 03/27/2019   K 4.8 03/27/2019   CL 98 03/27/2019   CREATININE 1.09 03/27/2019   BUN 22 03/27/2019   CO2 31 (H) 03/27/2019  TSH 2.212 12/03/2017   INR 1.06 04/04/2017   HGBA1C (H) 02/23/2010    5.7 (NOTE)                                                                       According to the ADA Clinical Practice Recommendations for 2011, when HbA1c is used as a screening test:   >=6.5%   Diagnostic of  Diabetes Mellitus           (if abnormal result  is confirmed)  5.7-6.4%   Increased risk of developing Diabetes Mellitus  References:Diagnosis and Classification of Diabetes Mellitus,Diabetes KCLE,7517,00(FVCBS 1):S62-S69 and Standards of Medical Care in         Diabetes - 2011,Diabetes Care,2011,34  (Suppl 1):S11-S61.       BNP (last 3 results) No results for input(s): BNP in the last 8760 hours.  ProBNP (last 3 results) Recent Labs    07/10/18 1604  PROBNP 648*     Other Studies Reviewed Today:  Lower Extremity Doppler Summary 04/2019:  Right: 75-99% stenosis noted in the common femoral artery. Total occlusion  noted in the superficial femoral artery with minimal reconstitution of the  popliteal artery.     See table(s) above for measurements and observations.    Monitor Study Highlights 12/2018    NSR  Frequent PVC's  NSVT up tp 8 secnonds  Blocked PAC's and conducted PAC's  Poor correlation between c/o dizziness, light headedness, and syncope with atthythmia.   Abnormal study with VT Will get EP opinion. Already on beta blocker.   Belva Crome, MD  12/19/2018 3:24 PM EST     Let the patient know the Monitor shows non sustained VT up to 8 seconds. Needs Magnesium and potassium levels drawn. Spoke with EP Dr Lovena Le who does not feel EP has anything to offer (Can't use Amio or any other drug, and there is no correlation between VT and fainting or symptoms). A copy will be sent to [name], MD    ASSESSMENT & PLAN:    1. Lower extremity edema/total occlusion of the right SFA - no options - has just had debridement for another ulcer - this is a very sad situation - not sure there is really anything to do - will try additional Lasix, check labs - we talked about narcotics like "Ultram", I have started a conversation about Hospice - he wishes to live for about 3 or 4 more months - he says he is a DNR - but then says he would have CPR - very unclear  expectations. His wife is exhausted. He is considering taking Zoloft that was prescribed a few months ago by PCP - I think that is ok - may actually help him get some rest and relieve a little anxiety. Overall situation is very tenuous with poor prognosis - I think he is nearing amputation which he really wants no part of. Will check lab today - make sure he is not having infection. Favor supportive/comfort care. Explained that I do not see a need for checking vitamin or cortisol levels at this time.   2. CAD - managed medically.   3. ICM  4. Cor Pulmonale  5. HTN  6. Severe COPD  7.  PAD - no options available - has total occlusion of the right superficial femoral artery  Current medicines are reviewed with the patient today.  The patient does not have concerns regarding medicines other than what has been noted above.  The following changes have been made:  See above.  Labs/ tests ordered today include:    Orders Placed This Encounter  Procedures  . Basic metabolic panel  . CBC  . Basic Metabolic Panel (BMET)  . CBC w/Diff  . EKG 12-Lead     Disposition:   FU with Dr. Scot Dock next.   Patient is agreeable to this plan and will call if any problems develop in the interim.   SignedTruitt Merle, NP  06/25/2019 4:33 PM  [place] [address] [place], [place]  [address] Phone: [phone] Fax: [phone]

## 2019-06-26 ENCOUNTER — Other Ambulatory Visit: Payer: Self-pay | Admitting: *Deleted

## 2019-06-26 ENCOUNTER — Telehealth: Payer: Self-pay | Admitting: *Deleted

## 2019-06-26 DIAGNOSIS — Z79899 Other long term (current) drug therapy: Secondary | ICD-10-CM

## 2019-06-26 LAB — BASIC METABOLIC PANEL
BUN/Creatinine Ratio: 23 (ref 10–24)
BUN: 29 mg/dL — ABNORMAL HIGH (ref 8–27)
CO2: 29 mmol/L (ref 20–29)
Calcium: 9.8 mg/dL (ref 8.6–10.2)
Chloride: 95 mmol/L — ABNORMAL LOW (ref 96–106)
Creatinine, Ser: 1.28 mg/dL — ABNORMAL HIGH (ref 0.76–1.27)
GFR calc Af Amer: 58 mL/min/{1.73_m2} — ABNORMAL LOW (ref >59)
GFR calc non Af Amer: 50 mL/min/{1.73_m2} — ABNORMAL LOW (ref >59)
Glucose: 123 mg/dL — ABNORMAL HIGH (ref 65–99)
Potassium: 3.8 mmol/L (ref 3.5–5.2)
Sodium: 139 mmol/L (ref 134–144)

## 2019-06-26 LAB — CBC
Hematocrit: 36.6 % — ABNORMAL LOW (ref 37.5–51.0)
Hemoglobin: 12.4 g/dL — ABNORMAL LOW (ref 13.0–17.7)
MCH: 32.5 pg (ref 26.6–33.0)
MCHC: 33.9 g/dL (ref 31.5–35.7)
MCV: 96 fL (ref 79–97)
Platelets: 208 10*3/uL (ref 150–450)
RBC: 3.82 x10E6/uL — ABNORMAL LOW (ref 4.14–5.80)
RDW: 12.2 % (ref 11.6–15.4)
WBC: 13.2 10*3/uL — ABNORMAL HIGH (ref 3.4–10.8)

## 2019-06-26 NOTE — Telephone Encounter (Signed)
S/w Katrina in lab today per Cecille Rubin.  Cecille Rubin wanted diff added to pt's CBC.  Put order in system.

## 2019-06-27 ENCOUNTER — Emergency Department (HOSPITAL_COMMUNITY): Payer: Medicare Other

## 2019-06-27 ENCOUNTER — Ambulatory Visit: Payer: Self-pay | Admitting: *Deleted

## 2019-06-27 ENCOUNTER — Emergency Department (HOSPITAL_BASED_OUTPATIENT_CLINIC_OR_DEPARTMENT_OTHER): Payer: Medicare Other

## 2019-06-27 ENCOUNTER — Inpatient Hospital Stay (HOSPITAL_COMMUNITY)
Admission: EM | Admit: 2019-06-27 | Discharge: 2019-07-02 | DRG: 300 | Disposition: A | Discharge status: Home Health | Payer: Medicare Other | Attending: Internal Medicine | Admitting: Internal Medicine

## 2019-06-27 ENCOUNTER — Encounter (HOSPITAL_BASED_OUTPATIENT_CLINIC_OR_DEPARTMENT_OTHER): Payer: Medicare Other | Admitting: Internal Medicine

## 2019-06-27 ENCOUNTER — Telehealth: Payer: Self-pay | Admitting: *Deleted

## 2019-06-27 ENCOUNTER — Other Ambulatory Visit: Payer: Self-pay | Admitting: *Deleted

## 2019-06-27 ENCOUNTER — Encounter (HOSPITAL_COMMUNITY): Payer: Self-pay

## 2019-06-27 DIAGNOSIS — N401 Enlarged prostate with lower urinary tract symptoms: Secondary | ICD-10-CM | POA: Diagnosis not present

## 2019-06-27 DIAGNOSIS — I4510 Unspecified right bundle-branch block: Secondary | ICD-10-CM | POA: Diagnosis present

## 2019-06-27 DIAGNOSIS — Z7982 Long term (current) use of aspirin: Secondary | ICD-10-CM

## 2019-06-27 DIAGNOSIS — Z7951 Long term (current) use of inhaled steroids: Secondary | ICD-10-CM

## 2019-06-27 DIAGNOSIS — H9190 Unspecified hearing loss, unspecified ear: Secondary | ICD-10-CM | POA: Diagnosis present

## 2019-06-27 DIAGNOSIS — R54 Age-related physical debility: Secondary | ICD-10-CM | POA: Diagnosis present

## 2019-06-27 DIAGNOSIS — Z881 Allergy status to other antibiotic agents status: Secondary | ICD-10-CM

## 2019-06-27 DIAGNOSIS — I480 Paroxysmal atrial fibrillation: Secondary | ICD-10-CM | POA: Diagnosis present

## 2019-06-27 DIAGNOSIS — E869 Volume depletion, unspecified: Secondary | ICD-10-CM | POA: Diagnosis present

## 2019-06-27 DIAGNOSIS — Z7952 Long term (current) use of systemic steroids: Secondary | ICD-10-CM

## 2019-06-27 DIAGNOSIS — Z7189 Other specified counseling: Secondary | ICD-10-CM

## 2019-06-27 DIAGNOSIS — L97819 Non-pressure chronic ulcer of other part of right lower leg with unspecified severity: Secondary | ICD-10-CM | POA: Diagnosis present

## 2019-06-27 DIAGNOSIS — I110 Hypertensive heart disease with heart failure: Secondary | ICD-10-CM | POA: Diagnosis present

## 2019-06-27 DIAGNOSIS — Z79899 Other long term (current) drug therapy: Secondary | ICD-10-CM

## 2019-06-27 DIAGNOSIS — R609 Edema, unspecified: Secondary | ICD-10-CM

## 2019-06-27 DIAGNOSIS — L89151 Pressure ulcer of sacral region, stage 1: Secondary | ICD-10-CM | POA: Diagnosis present

## 2019-06-27 DIAGNOSIS — Z882 Allergy status to sulfonamides status: Secondary | ICD-10-CM

## 2019-06-27 DIAGNOSIS — L03115 Cellulitis of right lower limb: Secondary | ICD-10-CM | POA: Diagnosis not present

## 2019-06-27 DIAGNOSIS — Z823 Family history of stroke: Secondary | ICD-10-CM

## 2019-06-27 DIAGNOSIS — I10 Essential (primary) hypertension: Secondary | ICD-10-CM | POA: Diagnosis present

## 2019-06-27 DIAGNOSIS — Z515 Encounter for palliative care: Secondary | ICD-10-CM | POA: Diagnosis not present

## 2019-06-27 DIAGNOSIS — I70235 Atherosclerosis of native arteries of right leg with ulceration of other part of foot: Secondary | ICD-10-CM | POA: Diagnosis present

## 2019-06-27 DIAGNOSIS — L97519 Non-pressure chronic ulcer of other part of right foot with unspecified severity: Secondary | ICD-10-CM | POA: Diagnosis present

## 2019-06-27 DIAGNOSIS — K219 Gastro-esophageal reflux disease without esophagitis: Secondary | ICD-10-CM | POA: Diagnosis present

## 2019-06-27 DIAGNOSIS — I5032 Chronic diastolic (congestive) heart failure: Secondary | ICD-10-CM | POA: Diagnosis present

## 2019-06-27 DIAGNOSIS — I70261 Atherosclerosis of native arteries of extremities with gangrene, right leg: Principal | ICD-10-CM | POA: Diagnosis present

## 2019-06-27 DIAGNOSIS — I2781 Cor pulmonale (chronic): Secondary | ICD-10-CM | POA: Diagnosis present

## 2019-06-27 DIAGNOSIS — D4959 Neoplasm of unspecified behavior of other genitourinary organ: Secondary | ICD-10-CM

## 2019-06-27 DIAGNOSIS — J9611 Chronic respiratory failure with hypoxia: Secondary | ICD-10-CM | POA: Diagnosis present

## 2019-06-27 DIAGNOSIS — Z9981 Dependence on supplemental oxygen: Secondary | ICD-10-CM

## 2019-06-27 DIAGNOSIS — E785 Hyperlipidemia, unspecified: Secondary | ICD-10-CM | POA: Diagnosis present

## 2019-06-27 DIAGNOSIS — Z66 Do not resuscitate: Secondary | ICD-10-CM | POA: Diagnosis not present

## 2019-06-27 DIAGNOSIS — I739 Peripheral vascular disease, unspecified: Secondary | ICD-10-CM | POA: Diagnosis present

## 2019-06-27 DIAGNOSIS — F329 Major depressive disorder, single episode, unspecified: Secondary | ICD-10-CM | POA: Diagnosis present

## 2019-06-27 DIAGNOSIS — N400 Enlarged prostate without lower urinary tract symptoms: Secondary | ICD-10-CM | POA: Diagnosis present

## 2019-06-27 DIAGNOSIS — R531 Weakness: Secondary | ICD-10-CM

## 2019-06-27 DIAGNOSIS — Z01810 Encounter for preprocedural cardiovascular examination: Secondary | ICD-10-CM

## 2019-06-27 DIAGNOSIS — L03116 Cellulitis of left lower limb: Secondary | ICD-10-CM | POA: Diagnosis present

## 2019-06-27 DIAGNOSIS — Z888 Allergy status to other drugs, medicaments and biological substances status: Secondary | ICD-10-CM

## 2019-06-27 DIAGNOSIS — Z87891 Personal history of nicotine dependence: Secondary | ICD-10-CM

## 2019-06-27 DIAGNOSIS — J449 Chronic obstructive pulmonary disease, unspecified: Secondary | ICD-10-CM | POA: Diagnosis present

## 2019-06-27 DIAGNOSIS — L089 Local infection of the skin and subcutaneous tissue, unspecified: Secondary | ICD-10-CM

## 2019-06-27 DIAGNOSIS — I959 Hypotension, unspecified: Secondary | ICD-10-CM | POA: Diagnosis not present

## 2019-06-27 DIAGNOSIS — J9612 Chronic respiratory failure with hypercapnia: Secondary | ICD-10-CM | POA: Diagnosis present

## 2019-06-27 DIAGNOSIS — R338 Other retention of urine: Secondary | ICD-10-CM | POA: Diagnosis not present

## 2019-06-27 DIAGNOSIS — I25709 Atherosclerosis of coronary artery bypass graft(s), unspecified, with unspecified angina pectoris: Secondary | ICD-10-CM | POA: Diagnosis present

## 2019-06-27 DIAGNOSIS — N200 Calculus of kidney: Secondary | ICD-10-CM | POA: Diagnosis present

## 2019-06-27 DIAGNOSIS — Z8249 Family history of ischemic heart disease and other diseases of the circulatory system: Secondary | ICD-10-CM

## 2019-06-27 DIAGNOSIS — N179 Acute kidney failure, unspecified: Secondary | ICD-10-CM | POA: Diagnosis present

## 2019-06-27 DIAGNOSIS — R39198 Other difficulties with micturition: Secondary | ICD-10-CM

## 2019-06-27 DIAGNOSIS — Z20822 Contact with and (suspected) exposure to covid-19: Secondary | ICD-10-CM | POA: Diagnosis present

## 2019-06-27 DIAGNOSIS — Z85828 Personal history of other malignant neoplasm of skin: Secondary | ICD-10-CM

## 2019-06-27 DIAGNOSIS — F419 Anxiety disorder, unspecified: Secondary | ICD-10-CM | POA: Diagnosis present

## 2019-06-27 DIAGNOSIS — I255 Ischemic cardiomyopathy: Secondary | ICD-10-CM | POA: Diagnosis present

## 2019-06-27 DIAGNOSIS — L8990 Pressure ulcer of unspecified site, unspecified stage: Secondary | ICD-10-CM | POA: Insufficient documentation

## 2019-06-27 DIAGNOSIS — I2510 Atherosclerotic heart disease of native coronary artery without angina pectoris: Secondary | ICD-10-CM | POA: Diagnosis present

## 2019-06-27 LAB — CBC WITH DIFFERENTIAL/PLATELET
Abs Immature Granulocytes: 0.09 10*3/uL — ABNORMAL HIGH (ref 0.00–0.07)
Basophils Absolute: 0.1 10*3/uL (ref 0.0–0.1)
Basophils Relative: 1 %
Eosinophils Absolute: 0.1 10*3/uL (ref 0.0–0.5)
Eosinophils Relative: 1 %
HCT: 39.1 % (ref 39.0–52.0)
Hemoglobin: 12.6 g/dL — ABNORMAL LOW (ref 13.0–17.0)
Immature Granulocytes: 1 %
Lymphocytes Relative: 7 %
Lymphs Abs: 0.7 10*3/uL (ref 0.7–4.0)
MCH: 32.1 pg (ref 26.0–34.0)
MCHC: 32.2 g/dL (ref 30.0–36.0)
MCV: 99.7 fL (ref 80.0–100.0)
Monocytes Absolute: 1.1 10*3/uL — ABNORMAL HIGH (ref 0.1–1.0)
Monocytes Relative: 10 %
Neutro Abs: 8.2 10*3/uL — ABNORMAL HIGH (ref 1.7–7.7)
Neutrophils Relative %: 80 %
Platelets: 206 10*3/uL (ref 150–400)
RBC: 3.92 MIL/uL — ABNORMAL LOW (ref 4.22–5.81)
RDW: 12.5 % (ref 11.5–15.5)
WBC: 10.2 10*3/uL (ref 4.0–10.5)
nRBC: 0 % (ref 0.0–0.2)

## 2019-06-27 LAB — WHITE BLOOD COUNT AND DIFFERENTIAL
Basophils Absolute: 0.1 10*3/uL (ref 0.0–0.2)
Basos: 0 %
EOS (ABSOLUTE): 0 10*3/uL (ref 0.0–0.4)
Eos: 0 %
Immature Grans (Abs): 0.1 10*3/uL (ref 0.0–0.1)
Immature Granulocytes: 1 %
Lymphocytes Absolute: 0.4 10*3/uL — ABNORMAL LOW (ref 0.7–3.1)
Lymphs: 3 %
Monocytes Absolute: 1 10*3/uL — ABNORMAL HIGH (ref 0.1–0.9)
Monocytes: 8 %
Neutrophils Absolute: 11.5 10*3/uL — ABNORMAL HIGH (ref 1.4–7.0)
Neutrophils: 88 %
WBC: 13 10*3/uL — ABNORMAL HIGH (ref 3.4–10.8)

## 2019-06-27 LAB — LACTIC ACID, PLASMA
Lactic Acid, Venous: 2 mmol/L (ref 0.5–1.9)
Lactic Acid, Venous: 1.1 mmol/L (ref 0.5–1.9)

## 2019-06-27 LAB — URINALYSIS, ROUTINE W REFLEX MICROSCOPIC
Bilirubin Urine: NEGATIVE
Glucose, UA: NEGATIVE mg/dL
Ketones, ur: NEGATIVE mg/dL
Leukocytes,Ua: NEGATIVE
Nitrite: NEGATIVE
Protein, ur: NEGATIVE mg/dL
Specific Gravity, Urine: 1.009 (ref 1.005–1.030)
pH: 5 (ref 5.0–8.0)

## 2019-06-27 LAB — COMPREHENSIVE METABOLIC PANEL
ALT: 18 U/L (ref 0–44)
AST: 22 U/L (ref 15–41)
Albumin: 3.8 g/dL (ref 3.5–5.0)
Alkaline Phosphatase: 59 U/L (ref 38–126)
Anion gap: 11 (ref 5–15)
BUN: 26 mg/dL — ABNORMAL HIGH (ref 8–23)
CO2: 33 mmol/L — ABNORMAL HIGH (ref 22–32)
Calcium: 9.5 mg/dL (ref 8.9–10.3)
Chloride: 96 mmol/L — ABNORMAL LOW (ref 98–111)
Creatinine, Ser: 1.44 mg/dL — ABNORMAL HIGH (ref 0.61–1.24)
GFR calc Af Amer: 50 mL/min — ABNORMAL LOW (ref >60)
GFR calc non Af Amer: 43 mL/min — ABNORMAL LOW (ref >60)
Glucose, Bld: 139 mg/dL — ABNORMAL HIGH (ref 70–99)
Potassium: 3.6 mmol/L (ref 3.5–5.1)
Sodium: 140 mmol/L (ref 135–145)
Total Bilirubin: 0.8 mg/dL (ref 0.3–1.2)
Total Protein: 5.9 g/dL — ABNORMAL LOW (ref 6.5–8.1)

## 2019-06-27 LAB — SARS CORONAVIRUS 2 BY RT PCR (HOSPITAL ORDER, PERFORMED IN ~~LOC~~ HOSPITAL LAB): SARS Coronavirus 2: NEGATIVE

## 2019-06-27 LAB — PROTIME-INR
INR: 1 (ref 0.8–1.2)
Prothrombin Time: 12.9 seconds (ref 11.4–15.2)

## 2019-06-27 LAB — SPECIMEN STATUS REPORT

## 2019-06-27 MED ORDER — ALPRAZOLAM 0.25 MG PO TABS
0.1250 mg | ORAL_TABLET | Freq: Three times a day (TID) | ORAL | Status: DC | PRN
  Administered 2019-06-30 – 2019-07-01 (×2): 0.125 mg via ORAL
  Filled 2019-06-27 (×2): qty 1

## 2019-06-27 MED ORDER — UMECLIDINIUM BROMIDE 62.5 MCG/INH IN AEPB
1.0000 | INHALATION_SPRAY | Freq: Every day | RESPIRATORY_TRACT | Status: DC
  Filled 2019-06-27: qty 7

## 2019-06-27 MED ORDER — ASPIRIN EC 81 MG PO TBEC
81.0000 mg | DELAYED_RELEASE_TABLET | Freq: Every day | ORAL | Status: DC
  Administered 2019-06-27 – 2019-07-02 (×4): 81 mg via ORAL
  Filled 2019-06-27 (×5): qty 1

## 2019-06-27 MED ORDER — FAMOTIDINE 20 MG PO TABS
20.0000 mg | ORAL_TABLET | Freq: Every day | ORAL | Status: DC
  Administered 2019-06-27 – 2019-07-02 (×5): 20 mg via ORAL
  Filled 2019-06-27 (×5): qty 1

## 2019-06-27 MED ORDER — SODIUM CHLORIDE 0.9 % IV SOLN
1.0000 g | INTRAVENOUS | Status: DC
  Administered 2019-06-27 – 2019-07-01 (×5): 1 g via INTRAVENOUS
  Filled 2019-06-27 (×3): qty 1
  Filled 2019-06-27 (×2): qty 10
  Filled 2019-06-27: qty 1

## 2019-06-27 MED ORDER — ONDANSETRON HCL 4 MG PO TABS: 4.0000 mg | ORAL_TABLET | Freq: Four times a day (QID) | ORAL | Status: DC | PRN

## 2019-06-27 MED ORDER — MOMETASONE FURO-FORMOTEROL FUM 200-5 MCG/ACT IN AERO
2.0000 | INHALATION_SPRAY | Freq: Two times a day (BID) | RESPIRATORY_TRACT | Status: DC
  Filled 2019-06-27: qty 8.8

## 2019-06-27 MED ORDER — CLINDAMYCIN PHOSPHATE 600 MG/50ML IV SOLN
600.0000 mg | Freq: Once | INTRAVENOUS | Status: AC
  Administered 2019-06-27: 600 mg via INTRAVENOUS
  Filled 2019-06-27: qty 50

## 2019-06-27 MED ORDER — HEPARIN SODIUM (PORCINE) 5000 UNIT/ML IJ SOLN
5000.0000 [IU] | Freq: Three times a day (TID) | INTRAMUSCULAR | Status: DC
  Administered 2019-06-27 – 2019-07-02 (×14): 5000 [IU] via SUBCUTANEOUS
  Filled 2019-06-27 (×15): qty 1

## 2019-06-27 MED ORDER — ACETAMINOPHEN 325 MG PO TABS: 650.0000 mg | ORAL_TABLET | Freq: Four times a day (QID) | ORAL | Status: DC | PRN

## 2019-06-27 MED ORDER — ATORVASTATIN CALCIUM 10 MG PO TABS
20.0000 mg | ORAL_TABLET | Freq: Every day | ORAL | Status: DC
  Administered 2019-06-27 – 2019-07-01 (×4): 20 mg via ORAL
  Filled 2019-06-27 (×8): qty 2

## 2019-06-27 MED ORDER — ACETAMINOPHEN 650 MG RE SUPP: 650.0000 mg | Freq: Four times a day (QID) | RECTAL | Status: DC | PRN

## 2019-06-27 MED ORDER — LEVALBUTEROL TARTRATE 45 MCG/ACT IN AERO: 2.0000 | INHALATION_SPRAY | RESPIRATORY_TRACT | Status: DC | PRN

## 2019-06-27 MED ORDER — ONDANSETRON HCL 4 MG/2ML IJ SOLN: 4.0000 mg | Freq: Four times a day (QID) | INTRAMUSCULAR | Status: DC | PRN

## 2019-06-27 MED ORDER — SODIUM CHLORIDE 0.9 % IV SOLN: INTRAVENOUS | Status: AC

## 2019-06-27 MED ORDER — FINASTERIDE 5 MG PO TABS
5.0000 mg | ORAL_TABLET | Freq: Every morning | ORAL | Status: DC
  Administered 2019-06-28 – 2019-07-02 (×4): 5 mg via ORAL
  Filled 2019-06-27 (×4): qty 1

## 2019-06-27 MED ORDER — LEVALBUTEROL HCL 0.63 MG/3ML IN NEBU: 0.6300 mg | INHALATION_SOLUTION | RESPIRATORY_TRACT | Status: DC | PRN

## 2019-06-27 NOTE — Progress Notes (Signed)

## 2019-06-27 NOTE — ED Triage Notes (Signed)
Pt arrives POV for eval of R foot wound infection. Pt reports that wound care has been caring for the wound on his foot, but the last 2-3 days he has developed streaking erythema, weeping edema and fatigue. Pt also reports hypotension at home, difficulty w/ fluid status d/t fluid pills.

## 2019-06-27 NOTE — Telephone Encounter (Signed)
Received a call from patient's wife, [name], stating that she was instructed to take patient to the ED. He had his right foot ulcer debrided this week from his femoral blockage and was also seen by his Cardiologist. He is taking his Lasix as prescribed, however he has started with weeping edema to his right foot which has worsened since yesterday. He also had labs drawn which showed and elevated WBC. He does not want an amputation, but his wife doesn't see this situation improving. She says that she is going to allow him to sleep for about 30-60 more minutes and then take him to the hospital. She is interested in discussing hospice. Unsure if he will be admitted or not, but will follow up.

## 2019-06-27 NOTE — ED Notes (Signed)
Pt transported to vascular.  °

## 2019-06-27 NOTE — ED Provider Notes (Signed)
[facility] EMERGENCY DEPARTMENT Provider Note   CSN: [id number] Arrival date & time: 06/27/19  1305     History Chief Complaint  Patient presents with  . Wound Infection  . Edema    [name] is a 86 y.o. male with PMH/o BPH, CAD, CHF, GERD, HTN, HLD who presents for evaluation of right foot redness and swelling for the last 3 days. Patient has a history of BLE edema. He had a wound debridement on 06/23/19. Wife reports that shortly after that she started noticing some redness and weeping of the wound. He always has some BLE edema. They have been working with adjusting his Lasix to combat the swelling but he's had some episodes of hypotension so they had cut it. At his appointment on 06/25/19 they had him start taking Lasix 40 mg daily and increased his Spirinolactone 25 mg. Despite these interventions, this RLE has become more erythematous and wife started noticing some weeping. Since then it he has continued to weep and drain. He had blood work done by his PCP and was noted to have elevated WBC and was advised to go to the ED for further evaluation. He states that he has not had any new injury. He has not noticed any overlying warmth. He does feel like the redness has spread quickly. It started on his foot and then over the last 24 hrs it has started spreading. He has a history of right SFA occlusion that is followed by Doren Custard (Vascular) who has said there is no intervention. Patient has been resistant to any amputation. He denies any fever, CP, SOB, abdominal pain.   The history is provided by the patient.       Past Medical History:  Diagnosis Date  . Arthritis    "generalized" (04/04/2017)  . Asthma   . Basal cell carcinoma    "left ear"  . BPH (benign prostatic hyperplasia)    severe; s/p multiple biopsies  . CAD (coronary artery disease)   . Carotid artery occlusion   . Chronic respiratory failure ([place])   . Chronic rhinitis   . COPD (chronic obstructive  pulmonary disease) (HCC)    2L [place] O2  . Diastolic heart failure ([place]) 2019  . Dilation of biliary tract   . Elevated troponin 03/09/2017  . Gallstones   . GERD (gastroesophageal reflux disease)   . History of blood transfusion    "w/his CABG" (04/04/2017)  . History of kidney stones   . Hyperlipidemia   . Hypertension   . On home oxygen therapy    "~ 24/7" (04/04/2017)  . Peripheral vascular disease ([place])   . Pneumonia 2019  . Squamous cell carcinoma of skin 04/23/2013   in situ-Right flank (txpbx)  . Squamous cell carcinoma of skin 01/23/2017   [place]/ in siut- Left ear rim (CX3FU)  . Syncope and collapse   . Ureteral tumor 08/2015   had endoscopic procedure for evaluation, unable to reach for biopsy    Patient Active Problem List   Diagnosis Date Noted  . Cellulitis of right lower extremity 06/27/2019  . AKI (acute kidney injury) ([place]) 06/27/2019  . Headache 10/16/2018  . Severe major depression without psychotic features ([place]) 09/22/2017  . Generalized anxiety disorder 09/22/2017  . Palliative care encounter 05/10/2017  . Weakness 04/04/2017  . Chest pain 03/24/2017  . AV block, [place] 1 03/24/2017  . CAD (coronary artery disease) 03/24/2017  . Chronic diastolic CHF (congestive heart failure) ([place]) 03/24/2017  . COPD (  chronic obstructive pulmonary disease) ([place]) 03/24/2017  . NSTEMI (non-ST elevated myocardial infarction) ([place])   . Elevated troponin 03/09/2017  . Acute on chronic diastolic heart failure ([place])   . Tachycardia 03/08/2017  . Influenza A 02/04/2017  . Cor pulmonale, chronic (HCC)/ clinical dx  09/13/2016  . Orthostatic hypotension 09/26/2015  . Angina at rest [facility]) 09/24/2015  . BPH (benign prostatic hyperplasia) 09/13/2015  . GERD (gastroesophageal reflux disease) 09/13/2015  . Anxiety 09/13/2015  . Sinusitis, chronic 09/09/2015  . DOE (dyspnea on exertion) 04/15/2015  . Acute heart failure ([place]) 03/18/2015  . Right calf pain 06/18/2014  . Coronary  artery disease involving coronary bypass graft of native heart with angina pectoris ([place])   . Peripheral vascular disease ([place])   . Essential hypertension   . Obstructive sleep apnea   . Rhinitis, chronic 01/29/2007  . COPD   GOLD IV/ 02/steroid dep  01/29/2007  . Chronic respiratory failure with hypoxia and hypercapnia ([place]) 01/29/2007  . Hyperlipidemia     Past Surgical History:  Procedure Laterality Date  . BASAL CELL CARCINOMA EXCISION Left    ear  . CARDIAC CATHETERIZATION     "prior to bypass"  . CATARACT EXTRACTION W/ INTRAOCULAR LENS  IMPLANT, BILATERAL Bilateral   . CORONARY ARTERY BYPASS GRAFT  10/2001   LIMA to LAD, SVG to OM1-2, SVG to RCA and PDA  . LITHOTRIPSY    . PROSTATE BIOPSY  Oct. 2014  . TONSILLECTOMY         Family History  Problem Relation Age of Onset  . Heart disease Mother   . Heart disease Father        Before age 41  . Breast cancer Mother   . Cancer Mother        Breast cancer  . Hypertension Mother   . Other Mother        AAA  and   Amputation  . Heart attack Father   . Stroke Brother        x3, still living   . Peripheral vascular disease Brother   . Heart disease Brother   . Hyperlipidemia Brother   . Hypertension Brother   . Heart disease Brother   . Allergies Brother     Social History   Tobacco Use  . Smoking status: Former Smoker    Packs/day: 2.00    Years: 33.00    Pack years: 66.00    Types: Cigarettes    Quit date: 03/25/1982    Years since quitting: 37.2  . Smokeless tobacco: Never Used  Vaping Use  . Vaping Use: Never used  Substance Use Topics  . Alcohol use: Yes    Alcohol/week: 0.0 standard drinks    Comment: 1.5-3 oz daily in the past, minimal use in the last few months  . Drug use: No    Home Medications Prior to Admission medications   Medication Sig Start Date End Date Taking? Authorizing Provider  ALPRAZolam (XANAX) 0.25 MG tablet TAKE 1 TABLET BY MOUTH THREE TIMES DAILY AS NEEDED Patient taking  differently: Take 0.125-0.25 mg by mouth 3 (three) times daily as needed for anxiety.  04/21/19  Yes Tanda Rockers, MD  aspirin (BAYER ASPIRIN) 325 MG tablet Take 325 mg by mouth at bedtime.    Yes [provider]  atorvastatin (LIPITOR) 20 MG tablet TAKE 1 TABLET BY MOUTH EVERY DAY Patient taking differently: Take 20 mg by mouth at bedtime.  05/24/16  Yes Belva Crome, MD  Cholecalciferol (VITAMIN D3) 2000 units TABS Take 2,000 Units by mouth daily with lunch.    Yes [provider]  famotidine (PEPCID) 20 MG tablet Take 20 mg by mouth at bedtime.    Yes [provider]  finasteride (PROSCAR) 5 MG tablet Take 5 mg by mouth daily with lunch.    Yes [provider]  fluticasone (FLONASE) 50 MCG/ACT nasal spray Place 2 sprays into both nostrils daily.   Yes [provider]  furosemide (LASIX) 20 MG tablet Take 1 tablet (20 mg total) by mouth 2 (two) times daily. 06/25/19  Yes Burtis Junes, NP  levalbuterol [facility] HFA) 45 MCG/ACT inhaler Inhale 2 puffs into the lungs every 4 (four) hours as needed for wheezing or shortness of breath. 04/14/19  Yes Tanda Rockers, MD  levalbuterol (XOPENEX) 0.63 MG/3ML nebulizer solution USE 1 VIAL VIA NEBULIZER EVERY 4 HOURS AS NEEDED FOR WHEEZING OR SHORTNESS OF BREATH Patient taking differently: Take 0.63 mg by nebulization every 4 (four) hours as needed for wheezing or shortness of breath.  05/10/18  Yes Tanda Rockers, MD  Multiple Vitamins-Minerals (PRESERVISION AREDS 2) CAPS Take 1 capsule by mouth daily.   Yes [provider]  NITROSTAT 0.4 MG SL tablet Place 0.4 mg under the tongue every 5 (five) minutes as needed for chest pain.   Yes [provider]  NONFORMULARY OR COMPOUNDED ITEM Apply 1 application topically See admin instructions. Triamcinolone/Cetaphil compounded cream- Apply to both legs once a day   Yes [provider]  OXYGEN Inhale 2 L/min into the lungs continuous.    Yes  [provider]  predniSONE (DELTASONE) 10 MG tablet FOR FLARE OF COUGH/WHEEZING MAY INCREASE TO 2 DAILY BETTER, THEN 1 DAILY Patient taking differently: Take 10-20 mg by mouth in the morning. Take 10 mg by mouth in the morning and may increase to 20 mg as needed for coughing/wheezing (then decrease back to 10 mg once daily) 02/25/19  Yes Tanda Rockers, MD  PRESCRIPTION MEDICATION See admin instructions. [employer] Trilogy ventilator- At bedtime   Yes [provider]  sodium chloride (MURO 128) 5 % ophthalmic solution Place 1 drop into both eyes 4 (four) times daily.   Yes [provider]  spironolactone (ALDACTONE) 25 MG tablet TAKE 1/2 TABLET BY MOUTH EVERY DAY Patient taking differently: Take 12.5 mg by mouth daily.  07/30/18  Yes Belva Crome, MD  SYMBICORT 160-4.5 MCG/ACT inhaler Inhale 2 puffs into the lungs 2 (two) times daily. 06/06/19  Yes Tanda Rockers, MD  TOPROL XL 25 MG 24 hr tablet Take 25 mg by mouth daily.   Yes [provider]  vitamin B-12 (CYANOCOBALAMIN) 1000 MCG tablet Take 1,000 mcg by mouth daily.    Yes [provider]  aspirin 81 MG tablet Take 81 mg by mouth daily.    [provider]  betamethasone dipropionate 0.05 % cream Apply topically 2 (two) times daily. Apply to both feet twice daily for 4 weeks. Patient not taking: Reported on 06/27/2019 01/13/19   Marzetta Board, DPM  metoprolol succinate (TOPROL-XL) 25 MG 24 hr tablet Take 1 tablet (25 mg total) by mouth daily. Patient not taking: Reported on 06/27/2019 10/22/18   Burtis Junes, NP  mometasone (NASONEX) 50 MCG/ACT nasal spray Place 2 sprays into the nose daily. Patient not taking: Reported on 06/27/2019 04/22/19   Tanda Rockers, MD  nitroGLYCERIN (NITROSTAT) 0.4 MG SL tablet Place 1 tablet (0.4 mg total)  under the tongue every 5 (five) minutes as needed for chest pain. Patient not taking: Reported on 06/27/2019 06/05/19   Belva Crome, MD  silodosin  (RAPAFLO) 4 MG CAPS capsule Take 4 mg by mouth daily. 06/17/19   [provider]  Tiotropium Bromide Monohydrate (SPIRIVA RESPIMAT) 2.5 MCG/ACT AERS INHALE 2 PUFFS BY MOUTH EVERY DAY Patient taking differently: Inhale 2 puffs into the lungs daily.  05/14/19   Tanda Rockers, MD    Allergies    Cefdinir, Nitrofurantoin, Sulfa antibiotics, Sulfonamide derivatives, Tape, Ciprofloxacin, Nitrofurantoin, Amoxicillin er, Doxycycline, Levaquin [levofloxacin], and Sertraline  Review of Systems   Review of Systems  Constitutional: Negative for fever.  Respiratory: Negative for cough and shortness of breath.   Cardiovascular: Positive for leg swelling. Negative for chest pain.  Gastrointestinal: Negative for abdominal pain, nausea and vomiting.  Genitourinary: Negative for dysuria and hematuria.  Skin: Positive for color change.  Neurological: Negative for weakness, numbness and headaches.  All other systems reviewed and are negative.   Physical Exam Updated Vital Signs BP 128/81 (BP Location: Right Arm)   Pulse 83   Temp 98.2 F (36.8 C) (Oral)   Resp 20   SpO2 100%   Physical Exam Vitals and nursing note reviewed.  Constitutional:      Appearance: Normal appearance. He is well-developed.  HENT:     Head: Normocephalic and atraumatic.  Eyes:     General: Lids are normal.     Conjunctiva/sclera: Conjunctivae normal.     Pupils: Pupils are equal, round, and reactive to light.  Cardiovascular:     Rate and Rhythm: Normal rate and regular rhythm.     Pulses:          Dorsalis pedis pulses are 1+ on the left side.     Heart sounds: Normal heart sounds. No murmur heard.  No friction rub. No gallop.      Comments: Difficulty palpating DP pulse. Faint pulse noted on doppler.  Pulmonary:     Effort: Pulmonary effort is normal.     Breath sounds: Normal breath sounds.     Comments: Lungs clear to auscultation bilaterally.  Symmetric chest rise.  No wheezing, rales,  rhonchi. Abdominal:     Palpations: Abdomen is soft. Abdomen is not rigid.     Tenderness: There is no abdominal tenderness. There is no guarding.  Musculoskeletal:        General: Normal range of motion.     Cervical back: Full passive range of motion without pain.     Comments: 2+ pitting edema noted to BLE that extends from the mid tib-fib bilaterally.   Skin:    General: Skin is warm and dry.     Capillary Refill: Capillary refill takes 2 to 3 seconds.     Comments: 2-3 second cap refill noted to BLE. RLE is erythematous with skin breakdown that is at the distal tib fib and extends distally. There is some weeping noted to the dorsal aspect. He has an ulcer on the lateral aspect of the foot with bandage in place. He has a small area of eschar noted to the medial aspect of the right 1st toe.   Neurological:     Mental Status: He is alert and oriented to person, place, and time.  Psychiatric:        Speech: Speech normal.             ED Results / Procedures / Treatments   Labs (all labs ordered  are listed, but only abnormal results are displayed) Labs Reviewed  COMPREHENSIVE METABOLIC PANEL - Abnormal; Notable for the following components:      Result Value   Chloride 96 (*)    CO2 33 (*)    Glucose, Bld 139 (*)    BUN 26 (*)    Creatinine, Ser 1.44 (*)    Total Protein 5.9 (*)    GFR calc non Af Amer 43 (*)    GFR calc Af Amer 50 (*)    All other components within normal limits  LACTIC ACID, PLASMA - Abnormal; Notable for the following components:   Lactic Acid, Venous 2.0 (*)    All other components within normal limits  CBC WITH DIFFERENTIAL/PLATELET - Abnormal; Notable for the following components:   RBC 3.92 (*)    Hemoglobin 12.6 (*)    Neutro Abs 8.2 (*)    Monocytes Absolute 1.1 (*)    Abs Immature Granulocytes 0.09 (*)    All other components within normal limits  URINALYSIS, ROUTINE W REFLEX MICROSCOPIC - Abnormal; Notable for the following components:    Hgb urine dipstick LARGE (*)    Bacteria, UA RARE (*)    All other components within normal limits  SARS CORONAVIRUS 2 BY RT PCR (HOSPITAL ORDER, [facility])  CULTURE, BLOOD (ROUTINE X 2)  CULTURE, BLOOD (ROUTINE X 2)  LACTIC ACID, PLASMA  PROTIME-INR  BASIC METABOLIC PANEL  CBC    EKG None  Radiology DG Chest 2 View  Result Date: 06/27/2019 CLINICAL DATA:  Possible sepsis.  Nonhealing right foot wounds. EXAM: CHEST - 2 VIEW COMPARISON:  Radiographs 02/11/2019.  CT 03/08/2017. FINDINGS: The heart size and mediastinal contours are stable status post median sternotomy and CABG. The lungs are hyperinflated with chronic biapical scarring and blunting of both costophrenic angles. No edema, confluent airspace opacity, pleural effusion or pneumothorax. The bones appear unchanged. IMPRESSION: Stable postoperative chest with evidence of chronic obstructive pulmonary disease. No acute cardiopulmonary process. Electronically Signed   By: Richardean Sale M.D.   On: 06/27/2019 14:29   DG Ankle Complete Right  Result Date: 06/27/2019 CLINICAL DATA:  Right foot wound infection. EXAM: RIGHT ANKLE - COMPLETE 3+ VIEW COMPARISON:  None. FINDINGS: There is no evidence of fracture, dislocation, or joint effusion. There is no evidence of arthropathy or other focal bone abnormality. Moderate to marked severity diffuse soft tissue swelling is seen. There is mild vascular calcification. IMPRESSION: Moderate to marked severity diffuse soft tissue swelling without acute osseous abnormality. Electronically Signed   By: Virgina Norfolk M.D.   On: 06/27/2019 16:32   DG Foot Complete Right  Result Date: 06/27/2019 CLINICAL DATA:  Right foot wound infection. EXAM: RIGHT FOOT COMPLETE - 3+ VIEW COMPARISON:  None. FINDINGS: There is no evidence of fracture or dislocation. There is no evidence of arthropathy or other focal bone abnormality. Marked severity soft tissue swelling is seen  involving predominantly the dorsal aspect of the mid and distal right foot. Mild to moderate severity vascular calcification is noted. IMPRESSION: Marked severity soft tissue swelling without evidence of acute osseous abnormality. Electronically Signed   By: Virgina Norfolk M.D.   On: 06/27/2019 16:31   VAS [place] LOWER EXTREMITY VENOUS (DVT) (ONLY [facility] & [facility] 7a-7p)  Result Date: 06/27/2019  Lower Venous DVTStudy Indications: Edema. Other Indications: Known severe multilevel arterial occlusive disease. Comparison Study: no prior Performing Technologist: Abram Sander RVS  Examination Guidelines: A complete evaluation includes B-mode imaging, spectral  Doppler, color Doppler, and power Doppler as needed of all accessible portions of each vessel. Bilateral testing is considered an integral part of a complete examination. Limited examinations for reoccurring indications may be performed as noted. The reflux portion of the exam is performed with the patient in reverse Trendelenburg.  +---------+---------------+---------+-----------+----------+--------------+ RIGHT    CompressibilityPhasicitySpontaneityPropertiesThrombus Aging +---------+---------------+---------+-----------+----------+--------------+ CFV      Full           Yes      Yes                                 +---------+---------------+---------+-----------+----------+--------------+ SFJ      Full                                                        +---------+---------------+---------+-----------+----------+--------------+ FV Prox  Full                                                        +---------+---------------+---------+-----------+----------+--------------+ FV Mid   Full                                                        +---------+---------------+---------+-----------+----------+--------------+ FV DistalFull                                                         +---------+---------------+---------+-----------+----------+--------------+ PFV      Full                                                        +---------+---------------+---------+-----------+----------+--------------+ POP      Full           Yes      Yes                                 +---------+---------------+---------+-----------+----------+--------------+ PTV      Full                                                        +---------+---------------+---------+-----------+----------+--------------+ PERO                                                  Not visualized +---------+---------------+---------+-----------+----------+--------------+   +----+---------------+---------+-----------+----------+--------------+ LEFTCompressibilityPhasicitySpontaneityPropertiesThrombus Aging +----+---------------+---------+-----------+----------+--------------+  CFV Full           Yes      Yes                                 +----+---------------+---------+-----------+----------+--------------+     Summary: RIGHT: - There is no evidence of deep vein thrombosis in the lower extremity.  - No cystic structure found in the popliteal fossa.  LEFT: - No evidence of common femoral vein obstruction.  *See table(s) above for measurements and observations. Electronically signed by Servando Snare MD on 06/27/2019 at 4:30:51 PM.    Final     Procedures Procedures (including critical care time)  Medications Ordered in ED Medications  heparin injection 5,000 Units (5,000 Units Subcutaneous Given 06/27/19 2128)  acetaminophen (TYLENOL) tablet 650 mg (has no administration in time range)    Or  acetaminophen (TYLENOL) suppository 650 mg (has no administration in time range)  ondansetron (ZOFRAN) tablet 4 mg (has no administration in time range)    Or  ondansetron (ZOFRAN) injection 4 mg (has no administration in time range)  cefTRIAXone (ROCEPHIN) 1 g in sodium chloride 0.9 % 100 mL IVPB (0  g Intravenous Stopped 06/27/19 2200)  aspirin EC tablet 81 mg (81 mg Oral Given 06/27/19 2126)  atorvastatin (LIPITOR) tablet 20 mg (20 mg Oral Given 06/27/19 2126)  famotidine (PEPCID) tablet 20 mg (20 mg Oral Given 06/27/19 2126)  finasteride (PROSCAR) tablet 5 mg (has no administration in time range)  levalbuterol (XOPENEX) nebulizer solution 0.63 mg (has no administration in time range)  mometasone-formoterol (DULERA) 200-5 MCG/ACT inhaler 2 puff (2 puffs Inhalation Not Given 06/27/19 2157)  umeclidinium bromide (INCRUSE ELLIPTA) 62.5 MCG/INH 1 puff (has no administration in time range)  ALPRAZolam (XANAX) tablet 0.125-0.25 mg (has no administration in time range)  0.9 %  sodium chloride infusion ( Intravenous New Bag/Given 06/27/19 2312)  clindamycin (CLEOCIN) IVPB 600 mg (0 mg Intravenous Stopped 06/27/19 1901)    ED Course  I have reviewed the triage vital signs and the nursing notes.  Pertinent labs & imaging results that were available during my care of the patient were reviewed by me and considered in my medical decision making (see chart for details).    MDM Rules/Calculators/A&P                          86 yo M with PMH/o BPH, CAD, CHF, GERD, HTN, HLD who presents for evaluation of RLE erythema and edema. He has had chronic BLE edema that they have been working to control but the redness just started over the last 2 days. No fevers. No injury. Patient is afebrile, non-toxic appearing, sitting comfortably on examination table. Vital signs reviewed and stable.  Difficulty obtaining palpable DP pulses on the right lower extremity.  He has 1+ DP pulse on the left.  He did have faint pulse noted with Doppler.  On exam he has 2+ edema noted to BLE. The right is significantly erythematous with evidence of skin break down. No overlying warmth. Concern for infectious process such as cellulitis. Also consider skin breakdown from chronic venous stasis changes. He does have known SFA occlusion on the  right side. Also consider DVT. Labs ordered at triage. Plan for XR, [place].   CBC shows WBC of 10.2. Hgb is 12.6. CMP shows BUN of 26 and Cr of 1.44. Lactic is 2.0.  CXR shows  no acute abnormalities.   XR of RLE shows no acute bony abnormality. U/S venous shows no evidence of DVT.   Given the progression of the erythema that has been spreading up his leg as well as continued skin breakdown, feel that he requires admission for cellulitis.  Patient given IV dose of [place] here in the emergency department.  Discussed patient with Dr. Posey Pronto (hospitalist) who accepts patient for admission.   Portions of this note were generated with [employer]. Dictation errors may occur despite best attempts at proofreading.  Final Clinical Impression(s) / ED Diagnoses Final diagnoses:  Cellulitis of left lower extremity  Wound infection    Rx / DC Orders ED Discharge Orders    None       Desma Mcgregor 06/27/19 2317    Veryl Speak, MD 06/29/19 [phone]

## 2019-06-27 NOTE — ED Notes (Signed)
Pt took 2 puffs of home supplied xopenex

## 2019-06-27 NOTE — Progress Notes (Signed)
Patient arrived to 5C13 via stretcher from ED. Safety precautions and orders reviewed with pt/family. VSS. Patient denied any distress. 2L applied.  Wounds charted  1. Bilateral elbows tears 2. Stage 1 PU-meplilex applied. 3. Right foot wound- OTA

## 2019-06-27 NOTE — H&P (Signed)
History and Physical    [name] XTG:[id number] DOB: [birth year] DOA: 06/27/2019  PCP: [name], MD  Patient coming from: Home  I have personally briefly reviewed patient's old medical records in [place]  Chief Complaint: Right foot wound  HPI: [name] is a 86 y.o. male with medical history significant for COPD/chronic respiratory failure with hypoxia on 2 L supplemental O2 via [place], CAD s/p CABG, chronic diastolic CHF, PVD with right SFA occlusion, PAF, hypertension, hyperlipidemia, BPH, depression/anxiety, and hard of hearing who presents to the ED for evaluation of right foot wound.  Patient has had a chronic nonhealing right foot wound related to his significant peripheral vascular disease.  He underwent wound debridement on 06/23/2019.  Afterwards he has noticed clear drainage from debridement wound site as well as increased erythema and some desquamation of dorsal foot skin.  He has had increased pain at night and with direct stimulation around the wound site.  He has not had any subjective fevers, chills, diaphoresis.  He reports chronic intermittent chest pain and dyspnea related to his COPD which is not significantly changed.  He reports a history of difficulty with urine output which has improved significantly since starting Rapaflo recently.  ED Course:  Initial vitals showed BP 145/79, pulse 106, RR 18, temp 98.1 Fahrenheit, SPO2 100% on room air.  Labs are notable for WBC 10.2, hemoglobin 12.6, platelets 206,000, sodium 140, potassium 3.6, bicarb 33, BUN 26, creatinine 1.44 (baseline between 1.0-1.1), serum glucose 139, LFTs within normal limits, lactic acid 2.0 >> 1.1.  Urinalysis is negative for UTI.  Blood cultures were obtained and pending.  SARS-CoV-2 PCR was collected and pending.  2 view chest x-ray shows prior sternotomy changes and hyperexpanded lung fields without evidence of focal consolidation, edema, or effusion.  Right foot x-ray shows  marked severity soft tissue swelling without evidence of acute osseous abnormality.  Right ankle x-ray shows moderate to marked severity diffuse soft tissue swelling without acute osseous abnormality.  Lower extremity Dopplers were negative for evidence of DVT or cystic structure in the popliteal fossa on the right.  No evidence of common femoral vein obstruction seen on the left.  Patient was given IV clindamycin and the hospitalist service was consulted to admit for further evaluation and management.  Review of Systems: All systems reviewed and are negative except as documented in history of present illness above.   Past Medical History:  Diagnosis Date  . Arthritis    "generalized" (04/04/2017)  . Asthma   . Basal cell carcinoma    "left ear"  . BPH (benign prostatic hyperplasia)    severe; s/p multiple biopsies  . CAD (coronary artery disease)   . Carotid artery occlusion   . Chronic respiratory failure ([place])   . Chronic rhinitis   . COPD (chronic obstructive pulmonary disease) (HCC)    2L [place] O2  . Diastolic heart failure ([place]) 2019  . Dilation of biliary tract   . Elevated troponin 03/09/2017  . Gallstones   . GERD (gastroesophageal reflux disease)   . History of blood transfusion    "w/his CABG" (04/04/2017)  . History of kidney stones   . Hyperlipidemia   . Hypertension   . On home oxygen therapy    "~ 24/7" (04/04/2017)  . Peripheral vascular disease ([place])   . Pneumonia 2019  . Squamous cell carcinoma of skin 04/23/2013   in situ-Right flank (txpbx)  . Squamous cell carcinoma of skin 01/23/2017  [place]/ in siut- Left ear rim (CX3FU)  . Syncope and collapse   . Ureteral tumor 08/2015   had endoscopic procedure for evaluation, unable to reach for biopsy    Past Surgical History:  Procedure Laterality Date  . BASAL CELL CARCINOMA EXCISION Left    ear  . CARDIAC CATHETERIZATION     "prior to bypass"  . CATARACT EXTRACTION W/ INTRAOCULAR LENS  IMPLANT, BILATERAL  Bilateral   . CORONARY ARTERY BYPASS GRAFT  10/2001   LIMA to LAD, SVG to OM1-2, SVG to RCA and PDA  . LITHOTRIPSY    . PROSTATE BIOPSY  Oct. 2014  . TONSILLECTOMY      Social History:  reports that he quit smoking about 37 years ago. His smoking use included cigarettes. He has a 66.00 pack-year smoking history. He has never used smokeless tobacco. He reports current alcohol use. He reports that he does not use drugs.  Allergies  Allergen Reactions  . Cefdinir Other (See Comments)    Severe Diarrhea  . Nitrofurantoin Swelling    Hand Swelling  . Sulfa Antibiotics Anaphylaxis and Swelling  . Sulfonamide Derivatives Swelling    REACTION: facial/tongue swelling  . Cefdinir Diarrhea    REACTION: diarrhea  . Ciprofloxacin Itching and Rash    Red itchy hands  . Nitrofurantoin Swelling    Swollen hands  . Amoxicillin Er     frequest urination  . Doxycycline Other (See Comments)    Felt terrible  . Sertraline Other (See Comments)    jittery  . Ciprofloxacin Itching and Rash  . Levaquin [Levofloxacin] Itching and Rash  . Levaquin [Levofloxacin] Itching and Rash    Family History  Problem Relation Age of Onset  . Heart disease Mother   . Heart disease Father        Before age 50  . Breast cancer Mother   . Cancer Mother        Breast cancer  . Hypertension Mother   . Other Mother        AAA  and   Amputation  . Heart attack Father   . Stroke Brother        x3, still living   . Peripheral vascular disease Brother   . Heart disease Brother   . Hyperlipidemia Brother   . Hypertension Brother   . Heart disease Brother   . Allergies Brother      Prior to Admission medications   Medication Sig Start Date End Date Taking? Authorizing Provider  ALPRAZolam Duanne Moron) 0.25 MG tablet TAKE 1 TABLET BY MOUTH THREE TIMES DAILY AS NEEDED 04/21/19   Tanda Rockers, MD  aspirin 81 MG tablet Take 81 mg by mouth daily.    [provider]  atorvastatin (LIPITOR) 20 MG tablet  TAKE 1 TABLET BY MOUTH EVERY DAY 05/24/16   Belva Crome, MD  betamethasone dipropionate 0.05 % cream Apply topically 2 (two) times daily. Apply to both feet twice daily for 4 weeks. 01/13/19   Marzetta Board, DPM  Cholecalciferol (VITAMIN D3) 2000 units TABS Take 2,000 Units by mouth daily.    [provider]  famotidine (PEPCID) 20 MG tablet Take by mouth.    [provider]  finasteride (PROSCAR) 5 MG tablet Take 5 mg by mouth every morning.    [provider]  fluticasone (FLONASE) 50 MCG/ACT nasal spray Place 2 sprays into both nostrils daily.    [provider]  furosemide (LASIX) 20 MG tablet Take 1  tablet (20 mg total) by mouth 2 (two) times daily. 06/25/19   Burtis Junes, NP  levalbuterol [facility] HFA) 45 MCG/ACT inhaler Inhale 2 puffs into the lungs every 4 (four) hours as needed for wheezing or shortness of breath. 04/14/19   Tanda Rockers, MD  levalbuterol (XOPENEX) 0.63 MG/3ML nebulizer solution USE 1 VIAL VIA NEBULIZER EVERY 4 HOURS AS NEEDED FOR WHEEZING OR SHORTNESS OF BREATH 05/10/18   Tanda Rockers, MD  metoprolol succinate (TOPROL-XL) 25 MG 24 hr tablet Take 1 tablet (25 mg total) by mouth daily. 10/22/18   Burtis Junes, NP  mometasone (NASONEX) 50 MCG/ACT nasal spray Place 2 sprays into the nose daily. 04/22/19   Tanda Rockers, MD  Multiple Vitamins-Minerals (PRESERVISION AREDS 2) CAPS Take 1 capsule by mouth daily.    [provider]  nitroGLYCERIN (NITROSTAT) 0.4 MG SL tablet Place 1 tablet (0.4 mg total) under the tongue every 5 (five) minutes as needed for chest pain. 06/05/19   Belva Crome, MD  OXYGEN Inhale 2 L continuous into the lungs.     [provider]  predniSONE (DELTASONE) 10 MG tablet FOR FLARE OF COUGH/WHEEZING MAY INCREASE TO 2 DAILY BETTER, THEN 1 DAILY 02/25/19   Tanda Rockers, MD  sodium chloride (MURO 128) 5 % ophthalmic ointment 1 drop.     [provider]  spironolactone  (ALDACTONE) 25 MG tablet TAKE 1/2 TABLET BY MOUTH EVERY DAY 07/30/18   Belva Crome, MD  SYMBICORT 160-4.5 MCG/ACT inhaler Inhale 2 puffs into the lungs 2 (two) times daily. 06/06/19   Tanda Rockers, MD  Tiotropium Bromide Monohydrate (SPIRIVA RESPIMAT) 2.5 MCG/ACT AERS INHALE 2 PUFFS BY MOUTH EVERY DAY 05/14/19   Tanda Rockers, MD  vitamin B-12 (CYANOCOBALAMIN) 1000 MCG tablet Take 1,000 mcg by mouth daily.     [provider]    Physical Exam: Vitals:   06/27/19 1730 06/27/19 1815 06/27/19 1830 06/27/19 1900  BP: 120/77 (!) 148/77 136/80 111/64  Pulse: 61 74 83   Resp: 13 12 18 20   Temp:      TempSrc:      SpO2: 100% 100% 100%    Constitutional: Chronically ill-appearing elderly man resting in bed with head elevated, NAD, calm, comfortable Eyes: PERRL, lids and conjunctivae normal ENMT: Mucous membranes are moist. Posterior pharynx clear of any exudate or lesions.hard of hearing. Neck: normal, supple, no masses. Respiratory: Distant breath sounds bilaterally, no wheezing, no crackles. Normal respiratory effort. No accessory muscle use.  Cardiovascular: Regular rate and rhythm, no murmurs / rubs / gallops.  +1 bilateral lower extremity edema.  Pedal pulses difficult to palpate. Abdomen: no tenderness, no masses palpated. No hepatosplenomegaly. Bowel sounds positive.  Musculoskeletal: no clubbing / cyanosis. No joint deformity upper and lower extremities. Good ROM, Normal muscle tone.  Skin: Erythema dorsal aspect of right foot with relief disclamation of superficial skin, approximately 1 cm wound lateral aspect of right foot with yellow tissue base and no active discharge.  Eschar present medial aspect of right first toe.  Abrasions to bilateral elbows with bandages in place. Neurologic: CN 2-12 grossly intact. Sensation diminished in the feet, moving all extremities equally.   Psychiatric: Alert and oriented x 3. Normal mood.           Labs on Admission: I have  personally reviewed following labs and imaging studies  CBC: Recent Labs  Lab 06/25/19 1449 06/27/19 1422  WBC 13.0*  13.2* 10.2  NEUTROABS  11.5* 8.2*  HGB 12.4* 12.6*  HCT 36.6* 39.1  MCV 96 99.7  PLT 208 924   Basic Metabolic Panel: Recent Labs  Lab 06/25/19 1449 06/27/19 1422  NA 139 140  K 3.8 3.6  CL 95* 96*  CO2 29 33*  GLUCOSE 123* 139*  BUN 29* 26*  CREATININE 1.28* 1.44*  CALCIUM 9.8 9.5   GFR: Estimated Creatinine Clearance: 32 mL/min (A) (by C-G formula based on SCr of 1.44 mg/dL (H)). Liver Function Tests: Recent Labs  Lab 06/27/19 1422  AST 22  ALT 18  ALKPHOS 59  BILITOT 0.8  PROT 5.9*  ALBUMIN 3.8   No results for input(s): LIPASE, AMYLASE in the last 168 hours. No results for input(s): AMMONIA in the last 168 hours. Coagulation Profile: Recent Labs  Lab 06/27/19 1422  INR 1.0   Cardiac Enzymes: No results for input(s): CKTOTAL, CKMB, CKMBINDEX, TROPONINI in the last 168 hours. BNP (last 3 results) Recent Labs    07/10/18 1604  PROBNP 648*   HbA1C: No results for input(s): HGBA1C in the last 72 hours. CBG: No results for input(s): GLUCAP in the last 168 hours. Lipid Profile: No results for input(s): CHOL, HDL, LDLCALC, TRIG, CHOLHDL, LDLDIRECT in the last 72 hours. Thyroid Function Tests: No results for input(s): TSH, T4TOTAL, FREET4, T3FREE, THYROIDAB in the last 72 hours. Anemia Panel: No results for input(s): VITAMINB12, FOLATE, FERRITIN, TIBC, IRON, RETICCTPCT in the last 72 hours. Urine analysis:    Component Value Date/Time   COLORURINE YELLOW 06/27/2019 1546   APPEARANCEUR CLEAR 06/27/2019 1546   LABSPEC 1.009 06/27/2019 1546   PHURINE 5.0 06/27/2019 1546   GLUCOSEU NEGATIVE 06/27/2019 1546   HGBUR LARGE (A) 06/27/2019 1546   BILIRUBINUR NEGATIVE 06/27/2019 1546   KETONESUR NEGATIVE 06/27/2019 1546   PROTEINUR NEGATIVE 06/27/2019 1546   NITRITE NEGATIVE 06/27/2019 1546   LEUKOCYTESUR NEGATIVE 06/27/2019 1546     Radiological Exams on Admission: DG Chest 2 View  Result Date: 06/27/2019 CLINICAL DATA:  Possible sepsis.  Nonhealing right foot wounds. EXAM: CHEST - 2 VIEW COMPARISON:  Radiographs 02/11/2019.  CT 03/08/2017. FINDINGS: The heart size and mediastinal contours are stable status post median sternotomy and CABG. The lungs are hyperinflated with chronic biapical scarring and blunting of both costophrenic angles. No edema, confluent airspace opacity, pleural effusion or pneumothorax. The bones appear unchanged. IMPRESSION: Stable postoperative chest with evidence of chronic obstructive pulmonary disease. No acute cardiopulmonary process. Electronically Signed   By: Richardean Sale M.D.   On: 06/27/2019 14:29   DG Ankle Complete Right  Result Date: 06/27/2019 CLINICAL DATA:  Right foot wound infection. EXAM: RIGHT ANKLE - COMPLETE 3+ VIEW COMPARISON:  None. FINDINGS: There is no evidence of fracture, dislocation, or joint effusion. There is no evidence of arthropathy or other focal bone abnormality. Moderate to marked severity diffuse soft tissue swelling is seen. There is mild vascular calcification. IMPRESSION: Moderate to marked severity diffuse soft tissue swelling without acute osseous abnormality. Electronically Signed   By: Virgina Norfolk M.D.   On: 06/27/2019 16:32   DG Foot Complete Right  Result Date: 06/27/2019 CLINICAL DATA:  Right foot wound infection. EXAM: RIGHT FOOT COMPLETE - 3+ VIEW COMPARISON:  None. FINDINGS: There is no evidence of fracture or dislocation. There is no evidence of arthropathy or other focal bone abnormality. Marked severity soft tissue swelling is seen involving predominantly the dorsal aspect of the mid and distal right foot. Mild to moderate severity vascular calcification is noted. IMPRESSION: Marked  severity soft tissue swelling without evidence of acute osseous abnormality. Electronically Signed   By: Virgina Norfolk M.D.   On: 06/27/2019 16:31   VAS [place]  LOWER EXTREMITY VENOUS (DVT) (ONLY [facility] & [facility] 7a-7p)  Result Date: 06/27/2019  Lower Venous DVTStudy Indications: Edema. Other Indications: Known severe multilevel arterial occlusive disease. Comparison Study: no prior Performing Technologist: Abram Sander RVS  Examination Guidelines: A complete evaluation includes B-mode imaging, spectral Doppler, color Doppler, and power Doppler as needed of all accessible portions of each vessel. Bilateral testing is considered an integral part of a complete examination. Limited examinations for reoccurring indications may be performed as noted. The reflux portion of the exam is performed with the patient in reverse Trendelenburg.  +---------+---------------+---------+-----------+----------+--------------+ RIGHT    CompressibilityPhasicitySpontaneityPropertiesThrombus Aging +---------+---------------+---------+-----------+----------+--------------+ CFV      Full           Yes      Yes                                 +---------+---------------+---------+-----------+----------+--------------+ SFJ      Full                                                        +---------+---------------+---------+-----------+----------+--------------+ FV Prox  Full                                                        +---------+---------------+---------+-----------+----------+--------------+ FV Mid   Full                                                        +---------+---------------+---------+-----------+----------+--------------+ FV DistalFull                                                        +---------+---------------+---------+-----------+----------+--------------+ PFV      Full                                                        +---------+---------------+---------+-----------+----------+--------------+ POP      Full           Yes      Yes                                  +---------+---------------+---------+-----------+----------+--------------+ PTV      Full                                                        +---------+---------------+---------+-----------+----------+--------------+  PERO                                                  Not visualized +---------+---------------+---------+-----------+----------+--------------+   +----+---------------+---------+-----------+----------+--------------+ LEFTCompressibilityPhasicitySpontaneityPropertiesThrombus Aging +----+---------------+---------+-----------+----------+--------------+ CFV Full           Yes      Yes                                 +----+---------------+---------+-----------+----------+--------------+     Summary: RIGHT: - There is no evidence of deep vein thrombosis in the lower extremity.  - No cystic structure found in the popliteal fossa.  LEFT: - No evidence of common femoral vein obstruction.  *See table(s) above for measurements and observations. Electronically signed by Servando Snare MD on 06/27/2019 at 4:30:51 PM.    Final     EKG: Independently reviewed. Sinus tachycardia with PAC, RBBB.  Not significantly changed when compared to prior.  Assessment/Plan Principal Problem:   Cellulitis of right lower extremity Active Problems:   Hyperlipidemia   Essential hypertension   Chronic respiratory failure with hypoxia and hypercapnia (HCC)   Coronary artery disease involving coronary bypass graft of native heart with angina pectoris (HCC)   Peripheral vascular disease (HCC)   BPH (benign prostatic hyperplasia)   GERD (gastroesophageal reflux disease)   Anxiety   Chronic diastolic CHF (congestive heart failure) (HCC)   COPD (chronic obstructive pulmonary disease) ([place])   AKI (acute kidney injury) (HCC)  [name] is a 86 y.o. male with medical history significant for COPD/chronic respiratory failure with hypoxia on 2 L supplemental O2 via [place], CAD s/p CABG, chronic  diastolic CHF, PVD with right SFA occlusion, PAF, hypertension, hyperlipidemia, BPH, depression/anxiety who is admitted with right lower extremity cellulitis.   Cellulitis of right lower extremity associated with chronic nonhealing right foot wound: -Continue IV ceftriaxone -Consult to wound care  PVD with total occlusion of right SFA: Has longstanding significant peripheral vascular disease, follows with vascular surgery Dr. Scot Dock.  Last office note documentation suggested patient not a candidate for arteriography or endovascular approach.  Patient states he would not want to undergo amputation if disease progression occurs.  Discussed with patient's son, Dr. Micah Noel, who would like consideration of further noninvasive evaluation/risk assessment for any potentially feasible endovascular intervention. -Update vascular US ABI w/ TBI -Continue aspirin and statin, holding Toprol-XL due to soft blood pressure  AKI: Creatinine 1.44 compared to baseline between 1.0-1.1.  Suspect component of prerenal and medication effect.  His lower extremity edema however overall appears intravascularly volume depleted. -Hold home Lasix and spironolactone for now -Place on gentle IVF overnight -Repeat labs in the morning  COPD/chronic respiratory failure: Uses 2 L of home O2 via [place] at home.  Currently stable. -Continue supplemental oxygen -Continue Xopenex nebulizers as needed, Symbicort, Spiriva -Patient uses trilogy at home nightly  Chronic diastolic CHF: Has lower extremity edema bilaterally, likely primarily from dependent edema and vascular insufficiency.  CHF otherwise appears compensated. -Strict I/O's, daily weights -Holding Lasix and spironolactone as above  CAD s/p CABG Hyperlipidemia: Chronic and stable.  Denies any chest pain. -Continue aspirin and statin  Paroxysmal atrial fibrillation: In sinus rhythm.  Not on anticoagulation.  Continue aspirin 81 mg alone.  Holding home Toprol-XL due to  to soft blood pressure.  Hypertension: Holding home Lasix, spironolactone, Toprol-XL due to soft blood pressures as above.  BPH: Continue Proscar.  GERD: Continue Pepcid.  Depression/anxiety: Continue Xanax as needed.  Deconditioning: Continue PT/OT while in hospital.   DVT prophylaxis: Subcutaneous heparin Code Status: DNI, confirmed with patient Family Communication: Discussed with patient's wife at bedside and son Dr. Micah Noel by phone Disposition Plan: From home and likely discharge to home pending improvement cellulitis and renal function. Consults called: None, consider vascular surgery consult in a.m. Admission status:  Status is: Observation  The patient remains OBS appropriate and will d/c before 2 midnights.  Dispo: The patient is from: Home              Anticipated d/c is to: Home              Anticipated d/c date is: 1 day pending improvement in cellulitis, AKI              Patient currently is not medically stable to d/c.   Zada Finders MD [facility]  If 7PM-7AM, please contact night-coverage [email or web address]  06/27/2019, 7:14 PM

## 2019-06-27 NOTE — Patient Outreach (Signed)
[facility]) Care Management  06/27/2019  NOOR VIDALES 1929-06-20 [id number]   Member was scheduled for outreach call today but noted that he is currently in the ED.  Call was received yesterday, 6/17, from wife stating that since member had leg wound debrided on 6/14 that his wound had been weeping.  She had already contacted the wound care center and was waiting for call back for further instructions.  She also inquires about [facility], advised that this care manager was notified that as of last week, they were still looking into his eligibility.  This care manager has reached out again to [facility] to follow up on referral status.  Will follow up with member pending disposition from ED visit.  Valente Kellon, [place], MSN [place] [phone]

## 2019-06-27 NOTE — Progress Notes (Signed)
Lower extremity venous has been completed.   Preliminary results in CV Proc.   Abram Sander 06/27/2019 4:30 PM

## 2019-06-28 ENCOUNTER — Encounter (HOSPITAL_COMMUNITY): Payer: Medicare Other

## 2019-06-28 ENCOUNTER — Inpatient Hospital Stay (HOSPITAL_COMMUNITY): Payer: Medicare Other

## 2019-06-28 DIAGNOSIS — L03115 Cellulitis of right lower limb: Secondary | ICD-10-CM | POA: Diagnosis present

## 2019-06-28 DIAGNOSIS — I959 Hypotension, unspecified: Secondary | ICD-10-CM | POA: Diagnosis not present

## 2019-06-28 DIAGNOSIS — I739 Peripheral vascular disease, unspecified: Secondary | ICD-10-CM

## 2019-06-28 DIAGNOSIS — N200 Calculus of kidney: Secondary | ICD-10-CM | POA: Diagnosis present

## 2019-06-28 DIAGNOSIS — K219 Gastro-esophageal reflux disease without esophagitis: Secondary | ICD-10-CM | POA: Diagnosis present

## 2019-06-28 DIAGNOSIS — I70261 Atherosclerosis of native arteries of extremities with gangrene, right leg: Secondary | ICD-10-CM | POA: Diagnosis present

## 2019-06-28 DIAGNOSIS — Z9981 Dependence on supplemental oxygen: Secondary | ICD-10-CM | POA: Diagnosis not present

## 2019-06-28 DIAGNOSIS — Z66 Do not resuscitate: Secondary | ICD-10-CM | POA: Diagnosis not present

## 2019-06-28 DIAGNOSIS — Z01810 Encounter for preprocedural cardiovascular examination: Secondary | ICD-10-CM | POA: Diagnosis not present

## 2019-06-28 DIAGNOSIS — N179 Acute kidney failure, unspecified: Secondary | ICD-10-CM | POA: Diagnosis present

## 2019-06-28 DIAGNOSIS — I255 Ischemic cardiomyopathy: Secondary | ICD-10-CM | POA: Diagnosis present

## 2019-06-28 DIAGNOSIS — F329 Major depressive disorder, single episode, unspecified: Secondary | ICD-10-CM | POA: Diagnosis present

## 2019-06-28 DIAGNOSIS — R39198 Other difficulties with micturition: Secondary | ICD-10-CM | POA: Diagnosis not present

## 2019-06-28 DIAGNOSIS — Z515 Encounter for palliative care: Secondary | ICD-10-CM | POA: Diagnosis not present

## 2019-06-28 DIAGNOSIS — L97819 Non-pressure chronic ulcer of other part of right lower leg with unspecified severity: Secondary | ICD-10-CM | POA: Diagnosis present

## 2019-06-28 DIAGNOSIS — I998 Other disorder of circulatory system: Secondary | ICD-10-CM | POA: Diagnosis not present

## 2019-06-28 DIAGNOSIS — J9612 Chronic respiratory failure with hypercapnia: Secondary | ICD-10-CM | POA: Diagnosis present

## 2019-06-28 DIAGNOSIS — L03116 Cellulitis of left lower limb: Secondary | ICD-10-CM | POA: Diagnosis present

## 2019-06-28 DIAGNOSIS — I110 Hypertensive heart disease with heart failure: Secondary | ICD-10-CM | POA: Diagnosis present

## 2019-06-28 DIAGNOSIS — J449 Chronic obstructive pulmonary disease, unspecified: Secondary | ICD-10-CM | POA: Diagnosis present

## 2019-06-28 DIAGNOSIS — I5032 Chronic diastolic (congestive) heart failure: Secondary | ICD-10-CM | POA: Diagnosis present

## 2019-06-28 DIAGNOSIS — E785 Hyperlipidemia, unspecified: Secondary | ICD-10-CM | POA: Diagnosis present

## 2019-06-28 DIAGNOSIS — R338 Other retention of urine: Secondary | ICD-10-CM | POA: Diagnosis not present

## 2019-06-28 DIAGNOSIS — L89151 Pressure ulcer of sacral region, stage 1: Secondary | ICD-10-CM | POA: Diagnosis present

## 2019-06-28 DIAGNOSIS — F419 Anxiety disorder, unspecified: Secondary | ICD-10-CM | POA: Diagnosis present

## 2019-06-28 DIAGNOSIS — R54 Age-related physical debility: Secondary | ICD-10-CM | POA: Diagnosis present

## 2019-06-28 DIAGNOSIS — I70235 Atherosclerosis of native arteries of right leg with ulceration of other part of foot: Secondary | ICD-10-CM | POA: Diagnosis present

## 2019-06-28 DIAGNOSIS — J9611 Chronic respiratory failure with hypoxia: Secondary | ICD-10-CM | POA: Diagnosis present

## 2019-06-28 DIAGNOSIS — Z20822 Contact with and (suspected) exposure to covid-19: Secondary | ICD-10-CM | POA: Diagnosis present

## 2019-06-28 LAB — BASIC METABOLIC PANEL
Anion gap: 9 (ref 5–15)
BUN: 23 mg/dL (ref 8–23)
CO2: 33 mmol/L — ABNORMAL HIGH (ref 22–32)
Calcium: 9.2 mg/dL (ref 8.9–10.3)
Chloride: 97 mmol/L — ABNORMAL LOW (ref 98–111)
Creatinine, Ser: 1.14 mg/dL (ref 0.61–1.24)
GFR calc Af Amer: >60 mL/min (ref >60)
GFR calc non Af Amer: 58 mL/min — ABNORMAL LOW (ref >60)
Glucose, Bld: 85 mg/dL (ref 70–99)
Potassium: 4.1 mmol/L (ref 3.5–5.1)
Sodium: 139 mmol/L (ref 135–145)

## 2019-06-28 LAB — CBC
HCT: 34.7 % — ABNORMAL LOW (ref 39.0–52.0)
Hemoglobin: 11 g/dL — ABNORMAL LOW (ref 13.0–17.0)
MCH: 31.6 pg (ref 26.0–34.0)
MCHC: 31.7 g/dL (ref 30.0–36.0)
MCV: 99.7 fL (ref 80.0–100.0)
Platelets: 182 10*3/uL (ref 150–400)
RBC: 3.48 MIL/uL — ABNORMAL LOW (ref 4.22–5.81)
RDW: 12.4 % (ref 11.5–15.5)
WBC: 9.7 10*3/uL (ref 4.0–10.5)
nRBC: 0 % (ref 0.0–0.2)

## 2019-06-28 MED ORDER — METOPROLOL SUCCINATE ER 25 MG PO TB24
25.0000 mg | ORAL_TABLET | Freq: Every day | ORAL | Status: DC
  Administered 2019-06-28 – 2019-06-29 (×2): 25 mg via ORAL
  Filled 2019-06-28 (×3): qty 1

## 2019-06-28 MED ORDER — PREDNISONE 10 MG PO TABS
10.0000 mg | ORAL_TABLET | Freq: Every day | ORAL | Status: DC
  Administered 2019-06-28 – 2019-07-02 (×4): 10 mg via ORAL
  Filled 2019-06-28 (×5): qty 1

## 2019-06-28 NOTE — Progress Notes (Signed)
I responded to pg from [place] saying daughter requested a Chaplain visit w/pt.  When I arrived [name] was sitting on the side of the bed. He said he had not requested a visit but maybe his wife did. When asked if his daughter could have requested a visit he said his daughter had not been there. Pt did not want a visit at the time. Please page if assistance is needed in the future. [place], [place]   06/28/19 2000  Clinical Encounter Type  Visited With Patient

## 2019-06-28 NOTE — Consult Note (Signed)
Hospital Consult    Reason for Consult: Right lower extremity cellulitis Referring Physician: Dr. Marthenia Rolling MRN #:  [id number]  History of Present Illness: This is a 86 y.o. male history of peripheral vascular disease followed by Dr. Scot Dock in our office.  He has known critical right lower extremity ischemia and also carotid artery disease with COPD requiring home oxygen.  He has not been considered revascularization candidate due to his common femoral as well as iliac occlusive disease and SFA occlusive disease and high risk for procedure.  He is was to follow with Dr. Scot Dock on July 7 but is now admitted with cellulitis.  Patient has persistent right lower extremity pain.  He was also having swelling of his bilateral lower extremities this is now better controlled since he has been admitted.  He does see the wound care center for ulcers on the right great toe and right lateral foot.  Patient previously walked considerably but has been limited due to his right lower extremity wounds and pain.  He does take aspirin and a statin.  Past Medical History:  Diagnosis Date  . Arthritis    "generalized" (04/04/2017)  . Asthma   . Basal cell carcinoma    "left ear"  . BPH (benign prostatic hyperplasia)    severe; s/p multiple biopsies  . CAD (coronary artery disease)   . Carotid artery occlusion   . Chronic respiratory failure ([place])   . Chronic rhinitis   . COPD (chronic obstructive pulmonary disease) (HCC)    2L [place] O2  . Diastolic heart failure ([place]) 2019  . Dilation of biliary tract   . Elevated troponin 03/09/2017  . Gallstones   . GERD (gastroesophageal reflux disease)   . History of blood transfusion    "w/his CABG" (04/04/2017)  . History of kidney stones   . Hyperlipidemia   . Hypertension   . On home oxygen therapy    "~ 24/7" (04/04/2017)  . Peripheral vascular disease ([place])   . Pneumonia 2019  . Squamous cell carcinoma of skin 04/23/2013   in situ-Right flank (txpbx)  . Squamous  cell carcinoma of skin 01/23/2017   [place]/ in siut- Left ear rim (CX3FU)  . Syncope and collapse   . Ureteral tumor 08/2015   had endoscopic procedure for evaluation, unable to reach for biopsy    Past Surgical History:  Procedure Laterality Date  . BASAL CELL CARCINOMA EXCISION Left    ear  . CARDIAC CATHETERIZATION     "prior to bypass"  . CATARACT EXTRACTION W/ INTRAOCULAR LENS  IMPLANT, BILATERAL Bilateral   . CORONARY ARTERY BYPASS GRAFT  10/2001   LIMA to LAD, SVG to OM1-2, SVG to RCA and PDA  . LITHOTRIPSY    . PROSTATE BIOPSY  Oct. 2014  . TONSILLECTOMY      Allergies  Allergen Reactions  . Cefdinir Diarrhea and Other (See Comments)    Severe Diarrhea  . Nitrofurantoin Swelling and Other (See Comments)    Hand Swelling  . Sulfa Antibiotics Anaphylaxis and Swelling  . Sulfonamide Derivatives Swelling and Other (See Comments)    Facial/tongue swelling  . Tape Other (See Comments)    SKIN IS VERY THIN AND TEARS EASILY!!!!! Please do NOT use "plastic" tape- USE PAPER!!  . Ciprofloxacin Itching, Rash and Other (See Comments)    Red itchy hands  . Nitrofurantoin Swelling and Other (See Comments)    Swollen hands  . Amoxicillin Er Other (See Comments)    Frequest urination  .  Doxycycline Other (See Comments)    "Felt terrible"  . Levaquin [Levofloxacin] Itching and Rash  . Sertraline Anxiety and Other (See Comments)    Makes the patient jittery    Prior to Admission medications   Medication Sig Start Date End Date Taking? Authorizing Provider  ALPRAZolam (XANAX) 0.25 MG tablet TAKE 1 TABLET BY MOUTH THREE TIMES DAILY AS NEEDED Patient taking differently: Take 0.125-0.25 mg by mouth 3 (three) times daily as needed for anxiety.  04/21/19  Yes Tanda Rockers, MD  aspirin (BAYER ASPIRIN) 325 MG tablet Take 325 mg by mouth at bedtime.    Yes [provider]  atorvastatin (LIPITOR) 20 MG tablet TAKE 1 TABLET BY MOUTH EVERY DAY Patient taking differently: Take  20 mg by mouth at bedtime.  05/24/16  Yes Belva Crome, MD  Cholecalciferol (VITAMIN D3) 2000 units TABS Take 2,000 Units by mouth daily with lunch.    Yes [provider]  famotidine (PEPCID) 20 MG tablet Take 20 mg by mouth at bedtime.    Yes [provider]  finasteride (PROSCAR) 5 MG tablet Take 5 mg by mouth daily with lunch.    Yes [provider]  fluticasone (FLONASE) 50 MCG/ACT nasal spray Place 2 sprays into both nostrils daily.   Yes [provider]  furosemide (LASIX) 20 MG tablet Take 1 tablet (20 mg total) by mouth 2 (two) times daily. 06/25/19  Yes Burtis Junes, NP  levalbuterol [facility] HFA) 45 MCG/ACT inhaler Inhale 2 puffs into the lungs every 4 (four) hours as needed for wheezing or shortness of breath. 04/14/19  Yes Tanda Rockers, MD  levalbuterol (XOPENEX) 0.63 MG/3ML nebulizer solution USE 1 VIAL VIA NEBULIZER EVERY 4 HOURS AS NEEDED FOR WHEEZING OR SHORTNESS OF BREATH Patient taking differently: Take 0.63 mg by nebulization every 4 (four) hours as needed for wheezing or shortness of breath.  05/10/18  Yes Tanda Rockers, MD  Multiple Vitamins-Minerals (PRESERVISION AREDS 2) CAPS Take 1 capsule by mouth daily.   Yes [provider]  NITROSTAT 0.4 MG SL tablet Place 0.4 mg under the tongue every 5 (five) minutes as needed for chest pain.   Yes [provider]  NONFORMULARY OR COMPOUNDED ITEM Apply 1 application topically See admin instructions. Triamcinolone/Cetaphil compounded cream- Apply to both legs once a day   Yes [provider]  OXYGEN Inhale 2 L/min into the lungs continuous.    Yes [provider]  predniSONE (DELTASONE) 10 MG tablet FOR FLARE OF COUGH/WHEEZING MAY INCREASE TO 2 DAILY BETTER, THEN 1 DAILY Patient taking differently: Take 10-20 mg by mouth in the morning. Take 10 mg by mouth in the morning and may increase to 20 mg as needed for coughing/wheezing (then decrease back to 10 mg once  daily) 02/25/19  Yes Tanda Rockers, MD  PRESCRIPTION MEDICATION See admin instructions. [place] Trilogy ventilator- At bedtime   Yes [provider]  sodium chloride (MURO 128) 5 % ophthalmic solution Place 1 drop into both eyes 4 (four) times daily.   Yes [provider]  spironolactone (ALDACTONE) 25 MG tablet TAKE 1/2 TABLET BY MOUTH EVERY DAY Patient taking differently: Take 12.5 mg by mouth daily.  07/30/18  Yes Belva Crome, MD  SYMBICORT 160-4.5 MCG/ACT inhaler Inhale 2 puffs into the lungs 2 (two) times daily. 06/06/19  Yes Tanda Rockers, MD  TOPROL XL 25 MG 24 hr tablet Take 25 mg by mouth daily.   Yes [provider]  vitamin B-12 (CYANOCOBALAMIN) 1000 MCG tablet Take 1,000 mcg by mouth daily.    Yes [provider]  aspirin 81 MG tablet Take 81 mg by mouth daily.    [provider]  betamethasone dipropionate 0.05 % cream Apply topically 2 (two) times daily. Apply to both feet twice daily for 4 weeks. Patient not taking: Reported on 06/27/2019 01/13/19   Marzetta Board, DPM  metoprolol succinate (TOPROL-XL) 25 MG 24 hr tablet Take 1 tablet (25 mg total) by mouth daily. Patient not taking: Reported on 06/27/2019 10/22/18   Burtis Junes, NP  mometasone (NASONEX) 50 MCG/ACT nasal spray Place 2 sprays into the nose daily. Patient not taking: Reported on 06/27/2019 04/22/19   Tanda Rockers, MD  nitroGLYCERIN (NITROSTAT) 0.4 MG SL tablet Place 1 tablet (0.4 mg total) under the tongue every 5 (five) minutes as needed for chest pain. Patient not taking: Reported on 06/27/2019 06/05/19   Belva Crome, MD  silodosin (RAPAFLO) 4 MG CAPS capsule Take 4 mg by mouth daily. 06/17/19   [provider]  Tiotropium Bromide Monohydrate (SPIRIVA RESPIMAT) 2.5 MCG/ACT AERS INHALE 2 PUFFS BY MOUTH EVERY DAY Patient taking differently: Inhale 2 puffs into the lungs daily.  05/14/19   Tanda Rockers, MD    Social History   Socioeconomic History    . Marital status: Married    Spouse name: Not on file  . Number of children: 2  . Years of education: Not on file  . Highest education level: Not on file  Occupational History  . 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 Smoking status: Former Smoker    Packs/day: 2.00    Years: 33.00    Pack years: 66.00    Types: Cigarettes    Quit date: 03/25/1982    Years since quitting: 37.2  . Smokeless tobacco: Never Used  Vaping Use  . Vaping Use: Never used  Substance and Sexual Activity  . Alcohol use: Yes    Alcohol/week: 0.0 standard drinks    Comment: 1.5-3 oz daily in the past, minimal use in the last few months  . Drug use: No  . Sexual activity: Not on file  Other Topics Concern  . Not on file  Social History Narrative   ** Merged History Encounter **       Pt lives at home with his spouse.         Social Determinants of Health   Financial Resource Strain:   . Difficulty of Paying Living Expenses:   Food Insecurity: No Food Insecurity  . Worried About Charity fundraiser in the Last Year: Never true  . Ran Out of Food in the Last Year: Never true  Transportation Needs: No Transportation Needs  . Lack of Transportation (Medical): No  . Lack of Transportation (Non-Medical): No  Physical Activity:   . Days of Exercise per Week:   . Minutes of Exercise per Session:   Stress:   . Feeling of Stress :   Social Connections:   . Frequency of Communication with Friends and Family:   . Frequency of Social Gatherings with Friends and Family:   . Attends Religious Services:   . Active Member of Clubs or Organizations:   . Attends Archivist Meetings:   Marland Kitchen Marital Status:   Intimate Partner Violence:   . Fear of Current or Ex-Partner:   . Emotionally Abused:   Marland Kitchen Physically  Abused:   . Sexually Abused:      Family History  Problem Relation Age of Onset  . Heart disease Mother   . Heart disease Father         Before age 19  . Breast cancer Mother   . Cancer Mother        Breast cancer  . Hypertension Mother   . Other Mother        AAA  and   Amputation  . Heart attack Father   . Stroke Brother        x3, still living   . Peripheral vascular disease Brother   . Heart disease Brother   . Hyperlipidemia Brother   . Hypertension Brother   . Heart disease Brother   . Allergies Brother     ROS:  Cardiovascular: []  chest pain/pressure []  palpitations []  SOB lying flat []  DOE []  pain in legs while walking [x]  pain in legs at rest []  pain in legs at night []  non-healing ulcers []  hx of DVT [x]  swelling in legs  Pulmonary: []  productive cough []  asthma/wheezing [x]  home O2  Neurologic: [x]  weakness in [x]  arms [x]  legs []  numbness in []  arms []  legs []  hx of CVA []  mini stroke [] difficulty speaking or slurred speech []  temporary loss of vision in one eye []  dizziness  Hematologic: []  hx of cancer [x]  easy bruising []  problems with blood clotting easily  Endocrine:   []  diabetes []  thyroid disease  GI []  vomiting blood []  blood in stool  GU: []  CKD/renal failure []  HD--[]  M/W/F or []  T/T/S []  burning with urination []  blood in urine  Psychiatric: []  anxiety []  depression  Musculoskeletal: []  arthritis []  joint pain  Integumentary: []  rashes []  ulcers  Constitutional: []  fever []  chills   Physical Examination  Vitals:   06/28/19 0902 06/28/19 1219  BP: (!) 145/76 108/60  Pulse: 73 75  Resp: 16 16  Temp: 97.8 F (36.6 C) 97.9 F (36.6 C)  SpO2: 100% 100%   Body mass index is 20.48 kg/m.  General:  nad HENT: WNL, normocephalic Pulmonary: normal non-labored breathing, on nasal cannula Cardiac: Palpable radial pulses bilaterally I cannot really palpate comfortable pulses Abdomen: soft, NT/ND, no masses Extremities: Feet have evidence of recent edema which is improved Right foot is ruborous there is a small ulceration with packing on the  right lateral aspect and a dry gangrenous ulcer on the medial right great toe Neurologic: A&O X 3; Appropriate Affect ; SENSATION: normal; MOTOR FUNCTION:  moving all extremities equally. Speech is fluent/normal   CBC    Component Value Date/Time   WBC 9.7 06/28/2019 0231   RBC 3.48 (L) 06/28/2019 0231   HGB 11.0 (L) 06/28/2019 0231   HGB 12.4 (L) 06/25/2019 1449   HCT 34.7 (L) 06/28/2019 0231   HCT 36.6 (L) 06/25/2019 1449   PLT 182 06/28/2019 0231   PLT 208 06/25/2019 1449   MCV 99.7 06/28/2019 0231   MCV 96 06/25/2019 1449   MCH 31.6 06/28/2019 0231   MCHC 31.7 06/28/2019 0231   RDW 12.4 06/28/2019 0231   RDW 12.2 06/25/2019 1449   LYMPHSABS 0.7 06/27/2019 1422   LYMPHSABS 0.4 (L) 06/25/2019 1449   MONOABS 1.1 (H) 06/27/2019 1422   EOSABS 0.1 06/27/2019 1422   EOSABS 0.0 06/25/2019 1449   BASOSABS 0.1 06/27/2019 1422   BASOSABS 0.1 06/25/2019 1449    BMET    Component Value Date/Time   NA 139 06/28/2019 0231  NA 139 06/25/2019 1449   K 4.1 06/28/2019 0231   CL 97 (L) 06/28/2019 0231   CO2 33 (H) 06/28/2019 0231   GLUCOSE 85 06/28/2019 0231   BUN 23 06/28/2019 0231   BUN 29 (H) 06/25/2019 1449   CREATININE 1.14 06/28/2019 0231   CALCIUM 9.2 06/28/2019 0231   GFRNONAA 58 (L) 06/28/2019 0231   GFRAA >60 06/28/2019 0231    COAGS: Lab Results  Component Value Date   INR 1.0 06/27/2019   INR 1.06 04/04/2017   INR 1.16 03/09/2017     Non-Invasive Vascular Imaging:   I reviewed her previous CT of his abdomen pelvis with contrast which demonstrates severe aorto iliac occlusive disease and common femoral occlusive disease.  I also reviewed a duplex which demonstrates occluded SFA and noncompressible ABIs bilaterally.  ASSESSMENT/PLAN: This is a 86 y.o. male with critical right lower extremity ischemia with wounds.  He has been considered to not be a revascularization candidate.  His right lower extremity issues are multifactorial secondary to both CHF exacerbation  with swelling that is now controlled as well as arterial insufficiency.  I discussed with the patient as well as his wife and son via telephone our options.  Unfortunately he would not be a good candidate for primary amputation given the lack of inflow to the right lower extremity both in reviewing her previous CT scan and by physical exam today.  He also has recent acute kidney injury which appears to be improving is not a great candidate for a new CT scan.  I discussed with the family proceeding with angiography from the left brachial approach using CO2 and limited contrast.  From there we could consider stenting of the iliacs if possible but he will definitely need the right common femoral lesion addressed as it appears his profunda is also subtotally occluded.  It is possible we could consider common femoral endarterectomy and retrograde stenting though this would likely need to be done with spinal anesthesia.  I discussed all the possibilities as well as the risk of moving forward with angiography and we will plan for this on Monday using CO2 and contrast from a left brachial approach.  I will discuss further with family tomorrow to make sure there are questions are answered.  Continue aspirin and statin.  Tineshia Becraft C. Donzetta Matters, MD [facility] Office: [phone] Pager: [phone]

## 2019-06-28 NOTE — Progress Notes (Signed)
VASCULAR LAB PRELIMINARY  PRELIMINARY  PRELIMINARY  PRELIMINARY  ABIs completed.    Preliminary report:  See CV proc for preliminary results.  Kate Sweetman, RVT 06/28/2019, 2:45 PM

## 2019-06-28 NOTE — TOC Initial Note (Signed)
Transition of Care [facility]) - Initial/Assessment Note    Patient Details  Name: [name] MRN: [id number] Date of Birth: 07-Jun-1929  Transition of Care [facility]) CM/SW Contact:    Claudie Leach, RN 06/28/2019, 5:02 PM  Clinical Narrative:                 Spoke with wife regarding d/c plans.  Wife states they plan for patient to come home with [facility] and have no preference of agency.  She would like to review Medicare list and make decision after further vascular studies on Monday.  Medicare list of [place] agencies left at bedside per her request.   Expected Discharge Plan: [place]     Patient Goals and CMS Choice Patient states their goals for this hospitalization and ongoing recovery are:: to get home CMS Medicare.gov Compare Post Acute Care list provided to:: Patient Represenative (must comment) (wife) Choice offered to / list presented to : Spouse  Expected Discharge Plan and Services Expected Discharge Plan: [place]       Living arrangements for the past 2 months: Single Family Home                       Prior Living Arrangements/Services Living arrangements for the past 2 months: Single Family Home Lives with:: Spouse Patient language and need for interpreter reviewed:: Yes Do you feel safe going back to the place where you live?: Yes      Need for Family Participation in Patient Care: Yes (Comment) Care giver support system in place?: Yes (comment) Current home services: DME Criminal Activity/Legal Involvement Pertinent to Current Situation/Hospitalization: No - Comment as needed  Activities of Daily Living Home Assistive Devices/Equipment: Cane (specify quad or straight), Blood pressure cuff, Walker (specify type), Wheelchair, Shower chair with back, Scales ADL Screening (condition at time of admission) Patient's cognitive ability adequate to safely complete daily activities?: Yes Is the patient deaf or have difficulty hearing?:  Yes Does the patient have difficulty seeing, even when wearing glasses/contacts?: No Does the patient have difficulty concentrating, remembering, or making decisions?: No Patient able to express need for assistance with ADLs?: Yes Does the patient have difficulty dressing or bathing?: Yes Independently performs ADLs?: No Does the patient have difficulty walking or climbing stairs?: Yes Weakness of Legs: Right Weakness of Arms/Hands: None   Emotional Assessment Appearance:: Appears stated age       Alcohol / Substance Use: Not Applicable Psych Involvement: No (comment)  Admission diagnosis:  Wound infection [T14.8XXA, L08.9] Cellulitis of left lower extremity [L03.116] Cellulitis of right lower extremity [L03.115] Patient Active Problem List   Diagnosis Date Noted  . Cellulitis of left lower extremity 06/28/2019  . Cellulitis of right lower extremity 06/27/2019  . AKI (acute kidney injury) ([place]) 06/27/2019  . Headache 10/16/2018  . Severe major depression without psychotic features ([place]) 09/22/2017  . Generalized anxiety disorder 09/22/2017  . Palliative care encounter 05/10/2017  . Weakness 04/04/2017  . Chest pain 03/24/2017  . AV block, [place] 1 03/24/2017  . CAD (coronary artery disease) 03/24/2017  . Chronic diastolic CHF (congestive heart failure) ([place]) 03/24/2017  . COPD (chronic obstructive pulmonary disease) ([place]) 03/24/2017  . NSTEMI (non-ST elevated myocardial infarction) ([place])   . Elevated troponin 03/09/2017  . Acute on chronic diastolic heart failure ([place])   . Tachycardia 03/08/2017  . Influenza A 02/04/2017  . Cor pulmonale, chronic (HCC)/ clinical dx  09/13/2016  .  Orthostatic hypotension 09/26/2015  . Angina at rest [facility]) 09/24/2015  . BPH (benign prostatic hyperplasia) 09/13/2015  . GERD (gastroesophageal reflux disease) 09/13/2015  . Anxiety 09/13/2015  . Sinusitis, chronic 09/09/2015  . DOE (dyspnea on exertion) 04/15/2015  . Acute heart failure  ([facility]) 03/18/2015  . Right calf pain 06/18/2014  . Coronary artery disease involving coronary bypass graft of native heart with angina pectoris ([place])   . Peripheral vascular disease ([place])   . Essential hypertension   . Obstructive sleep apnea   . Rhinitis, chronic 01/29/2007  . COPD   GOLD IV/ 02/steroid dep  01/29/2007  . Chronic respiratory failure with hypoxia and hypercapnia ([place]) 01/29/2007  . Hyperlipidemia    PCP:  [name], MD Pharmacy:   [facility] [place], [address] AT [address] [place] [address] Phone: [phone] Fax: [phone]

## 2019-06-28 NOTE — Consult Note (Signed)
[facility] Nurse Consult Note: Reason for Consult: Chronic, nonhealing wounds to right foot. Skin tear to elbow. Wound type: Lower extremity wounds of mixed etiology, both venous and artierial insufficiency. Followed by the outpatient [facility] in [place], [place] [facility]). Last seen there 5 days ago on 06/24/19.  Wife is independent in wound care and haas supplies.  Prefers to use her Promogran/Prisma (a silver collagen dressing that is nonformulary at [facility]) until her supply runs out. If that occurs, we will use a silver hydrofiber dressing (Aquacel Advantage, [place] # [id number]). Patient and his wife report that the efforts are directed toward limb salvage and that he does not wish an amputation. Pressure Injury POA: Yes/No/NA Measurement: Per Margarita Grizzle Stone's [facility]) note on 06/24/19. Wound bed: red, moist Drainage (amount, consistency, odor) scant from wound, moderate from partial thickness wounds on anterior foot. Periwound: erythematous, edematous Dressing procedure/placement/frequency: I will continue the POC from the outpatient [facility] as above. Betadine to eschar, silver collagen to defect, I will add white petrolatum to anterior foot with weeping.  Silicone foam dressing to elbow skin tears. Wife to bring in foam boots from home so we will not order here.  A pressure redistribution chair pad is ordered as well as silicone foam to sacrum for pressure injury prevention.  [place] nursing team will not follow, but will remain available to this patient, the nursing and medical teams.  Please re-consult if needed. Thanks, Maudie Flakes, MSN, RN, [place], Arther Abbott  Pager# [phone]

## 2019-06-28 NOTE — Progress Notes (Signed)
Pressure cushion was provided to patient for stage 1 on bottom. Patient and wife refused cushion. [name] brought in prevalent boots from home. When attempted to put boots on pt, pt's wife stated that they would get saturated with wound discharge. Told pt's wife that dressing is dry right now and if needed, will wrap foot will towel to keep boots clean. [name] just stared blankly. Boots are currently off. Will continue to monitor.

## 2019-06-28 NOTE — Progress Notes (Addendum)
Patient's wife requests to spend the night as RN explained the hospital policy, pt's wife states "you're lying.. you just wanted to kick me out because I was a b&^%h to the nurse today." RN apologized to Mrs. Heiner that RN will have [facility] security explained policy as the intercom announced that visiting hours are over. Mrs. Bolyard apologizes for her comment and shared that they have been together for over 60 years and have never been away from each other and afraid he may die tomorrow from amputation. RN reviewed orders with pt and family as no surgical intervention scheduled tomorrow for him.   S. officer Tyler visited patient's room and re-explained visitation policy. RN will make exception for wife to spend the night with patient's tonight due to their age and personal reasons.   RN revisited masking policy, visitation policy, and "Do not Enter" zone as pt's wife tends to walks in side the nurse's station randomly for the nurse's attention. Call light given.   Patient's wife noted with questions regarding medications and MD's orders. RN reviewed them again with pt & family. Patient refused prevalon boots and bottom cushions as this time. Mrs. Schwager verbalized she appreciate staffs on [facility] at this time.  Patient denied any discomfort or pain at this time. Will continue to monitor.

## 2019-06-28 NOTE — Progress Notes (Signed)
Occupational Therapy Evaluation Patient Details Name: [name] MRN: [id number] DOB: 1929-12-04 Today's Date: 06/28/2019    History of Present Illness Pt is an 31 s/p male secondary to R LE cellulitis. PMH including but not limited to COPD/chronic respiratory failure with hypoxia on 2 L supplemental O2 via [place], CAD s/p CABG, chronic diastolic CHF, PVD with right SFA occlusion, PAF, hypertension, hyperlipidemia, BPH, depression/anxiety, and hard of hearing.    Clinical Impression   Patient lives with wife in a multi level home.  He is independent at baseline, though since start of R foot trouble (~3 months ago) patient has had more difficulty with mobility, sometimes using a cane.  Today he was able perform functional mobility and ADLs with supervision.  R foot pain is his biggest limitation at this time.  Educated on importance of keeping foot elevated when resting.  Will continue to follow with OT acutely to address the deficits listed below.      Follow Up Recommendations  Supervision - Intermittent;Home health OT    Equipment Recommendations  None recommended by OT    Recommendations for Other Services       Precautions / Restrictions Precautions Precautions: Fall Precaution Comments: on 2L of O2 at baseline Restrictions Weight Bearing Restrictions: No      Mobility Bed Mobility Overal bed mobility: Modified Independent             General bed mobility comments: pt sitting EOB with OT present upon arrival  Transfers Overall transfer level: Needs assistance Equipment used: Rolling walker (2 wheeled);None Transfers: Sit to/from Stand Sit to Stand: Supervision         General transfer comment: pt stable with transitions    Balance Overall balance assessment: Needs assistance Sitting-balance support: Feet supported Sitting balance-Leahy Scale: Good     Standing balance support: No upper extremity supported;During functional activity Standing balance-Leahy  Scale: Fair                             ADL either performed or assessed with clinical judgement   ADL Overall ADL's : Needs assistance/impaired                                     Functional mobility during ADLs: Supervision/safety General ADL Comments: Patient is supervision for all ADLs at this time.       Vision         Perception     Praxis      Pertinent Vitals/Pain Pain Assessment: 0-10 Pain Score: 5  (8 with weight bearing) Pain Location: R foot Pain Descriptors / Indicators: Aching;Discomfort;Tender Pain Intervention(s): Monitored during session;Limited activity within patient's tolerance;Repositioned     Hand Dominance Right   Extremity/Trunk Assessment Upper Extremity Assessment Upper Extremity Assessment: Overall WFL for tasks assessed   Lower Extremity Assessment Lower Extremity Assessment: Defer to PT evaluation   Cervical / Trunk Assessment Cervical / Trunk Assessment: Kyphotic   Communication Communication Communication: HOH   Cognition Arousal/Alertness: Awake/alert Behavior During Therapy: WFL for tasks assessed/performed Overall Cognitive Status: Within Functional Limits for tasks assessed                                     General Comments  Patient on 2L O2 during session. Wound care in  at start of session cleaning and wrapping R foot.  Foot very swollen and red.    Exercises     Shoulder Instructions      Home Living Family/patient expects to be discharged to:: Private residence Living Arrangements: Spouse/significant other Available Help at Discharge: Family Type of Home: House Home Access: Level entry (in through basement door they usually use)     Home Layout: Multi-level;Bed/bath upstairs Alternate Level Stairs-Number of Steps: 9 steps from basement to main floor (kitchen/living), 6 steps to bedroom/bath   Bathroom Shower/Tub: Occupational psychologist: Standard     Home  Equipment: Environmental consultant - 2 wheels;Cane - single point;Shower seat          Prior Functioning/Environment Level of Independence: Independent with assistive device(s)        Comments: Patient typically does not require assist an is indepehdent with mobility.  SInce foot has started having issues ~3 months ago patient sometimes uses cane        OT Problem List: Decreased strength      OT Treatment/Interventions: Self-care/ADL training;Therapeutic exercise;Therapeutic activities;Balance training;Patient/family education    OT Goals(Current goals can be found in the care plan section) Acute Rehab OT Goals Patient Stated Goal: decrease pain OT Goal Formulation: With patient Time For Goal Achievement: 07/12/19 Potential to Achieve Goals: Good  OT Frequency: Min 2X/week   Barriers to D/C:            Co-evaluation              AM-PAC OT "6 Clicks" Daily Activity     Outcome Measure Help from another person eating meals?: None Help from another person taking care of personal grooming?: A Little Help from another person toileting, which includes using toliet, bedpan, or urinal?: A Little Help from another person bathing (including washing, rinsing, drying)?: A Little Help from another person to put on and taking off regular upper body clothing?: A Little Help from another person to put on and taking off regular lower body clothing?: A Little 6 Click Score: 19   End of Session Equipment Utilized During Treatment: Oxygen Nurse Communication: Mobility status  Activity Tolerance: Patient tolerated treatment well Patient left: in chair;with call bell/phone within reach;with family/visitor present  OT Visit Diagnosis: Pain;Unsteadiness on feet (R26.81) Pain - Right/Left: Right Pain - part of body: Ankle and joints of foot                Time: 5009-3818 OT Time Calculation (min): 23 min Charges:  OT General Charges $OT Visit: 1 Visit OT Evaluation $OT Eval Moderate  Complexity: 1 Mod OT Treatments $Self Care/Home Management : 8-22 mins  August Luz, OTR/L   Phylliss Bob 06/28/2019, 2:50 PM

## 2019-06-28 NOTE — Evaluation (Signed)
Physical Therapy Evaluation Patient Details Name: [name] MRN: [id number] DOB: September 03, 1929 Today's Date: 06/28/2019   History of Present Illness  Pt is an 32 s/p male secondary to R LE cellulitis. PMH including but not limited to COPD/chronic respiratory failure with hypoxia on 2 L supplemental O2 via [place], CAD s/p CABG, chronic diastolic CHF, PVD with right SFA occlusion, PAF, hypertension, hyperlipidemia, BPH, depression/anxiety, and hard of hearing.     Clinical Impression  Pt presented sitting up at EOB, awake and willing to participate in therapy session. Prior to admission, pt reported that he was independent with all functional mobility and ADLs. Pt lives with his wife in a multi-level home (basement is the computer/tv room, main level is the kitchen and living room, second level is the bed/bathroom). At the time of evaluation, pt slightly limited secondary to R foot pain. He was able to perform transfers with supervision and ambulate a short distance in his room with RW and min guard for safety. He reported an 8/10 pain in R foot with [place]. Would like to trial stair training at next session if he is able to tolerate as he has a lot of stairs to manage at home. Pt would continue to benefit from skilled physical therapy services at this time while admitted and after d/c to address the below listed limitations in order to improve overall safety and independence with functional mobility.     Follow Up Recommendations Home health PT    Equipment Recommendations  None recommended by PT    Recommendations for Other Services       Precautions / Restrictions Precautions Precautions: Fall Precaution Comments: on 2L of O2 at baseline Restrictions Weight Bearing Restrictions: No      Mobility  Bed Mobility               General bed mobility comments: pt sitting EOB with OT present upon arrival  Transfers Overall transfer level: Needs assistance Equipment used: Rolling  walker (2 wheeled);None Transfers: Sit to/from Stand Sit to Stand: Supervision         General transfer comment: pt stable with transitions  Ambulation/Gait Ambulation/Gait assistance: Min guard Gait Distance (Feet): 20 Feet Assistive device: Rolling walker (2 wheeled) Gait Pattern/deviations: Step-through pattern;Decreased stride length Gait velocity: decreased   General Gait Details: pt steady overall with ambulation with use of RW in room and around obstacles  Stairs            Wheelchair Mobility    Modified Rankin (Stroke Patients Only)       Balance Overall balance assessment: Needs assistance Sitting-balance support: Feet supported Sitting balance-Leahy Scale: Good     Standing balance support: No upper extremity supported;During functional activity Standing balance-Leahy Scale: Fair                               Pertinent Vitals/Pain Pain Assessment: 0-10 Pain Score: 8  Pain Location: R foot with WB'ing Pain Descriptors / Indicators: Aching;Discomfort;Tender Pain Intervention(s): Monitored during session;Repositioned    Home Living Family/patient expects to be discharged to:: Private residence Living Arrangements: Spouse/significant other Available Help at Discharge: Family Type of Home: House Home Access: Level entry (in through basement door they usually use)     Home Layout: Multi-level;Bed/bath upstairs Home Equipment: Environmental consultant - 2 wheels;Cane - single point;Shower seat      Prior Function Level of Independence: Independent with assistive device(s)  Comments: Patient typically does not require assist an is indepehdent with mobility.  SInce foot has started having issues ~3 months ago patient sometimes uses cane     Hand Dominance   Dominant Hand: Right    Extremity/Trunk Assessment   Upper Extremity Assessment Upper Extremity Assessment: Defer to OT evaluation;Overall [facility] for tasks assessed    Lower  Extremity Assessment Lower Extremity Assessment: Overall WFL for tasks assessed    Cervical / Trunk Assessment Cervical / Trunk Assessment: Kyphotic  Communication   Communication: HOH  Cognition Arousal/Alertness: Awake/alert Behavior During Therapy: WFL for tasks assessed/performed Overall Cognitive Status: Within Functional Limits for tasks assessed                                        General Comments      Exercises     Assessment/Plan    PT Assessment Patient needs continued PT services  PT Problem List Decreased balance;Decreased activity tolerance;Decreased mobility;Decreased coordination;Decreased knowledge of use of DME;Decreased safety awareness;Decreased knowledge of precautions;Pain       PT Treatment Interventions Stair training;DME instruction;Gait training;Functional mobility training;Therapeutic exercise;Balance training;Therapeutic activities;Neuromuscular re-education;Patient/family education    PT Goals (Current goals can be found in the Care Plan section)  Acute Rehab PT Goals Patient Stated Goal: decrease pain PT Goal Formulation: With patient/family Time For Goal Achievement: 07/12/19 Potential to Achieve Goals: Good    Frequency Min 3X/week   Barriers to discharge        Co-evaluation               AM-PAC PT "6 Clicks" Mobility  Outcome Measure Help needed turning from your back to your side while in a flat bed without using bedrails?: None Help needed moving from lying on your back to sitting on the side of a flat bed without using bedrails?: None Help needed moving to and from a bed to a chair (including a wheelchair)?: None Help needed standing up from a chair using your arms (e.g., wheelchair or bedside chair)?: None Help needed to walk in hospital room?: A Little Help needed climbing 3-5 steps with a railing? : A Little 6 Click Score: 22    End of Session Equipment Utilized During Treatment: Oxygen Activity  Tolerance: Patient tolerated treatment well Patient left: in chair;with call bell/phone within reach;with family/visitor present Nurse Communication: Mobility status PT Visit Diagnosis: Other abnormalities of gait and mobility (R26.89);Pain Pain - Right/Left: Right Pain - part of body: Ankle and joints of foot    Time: 1030-1044 PT Time Calculation (min) (ACUTE ONLY): 14 min   Charges:   PT Evaluation $PT Eval Low Complexity: 1 Low          Eduard Clos, PT, DPT  Acute Rehabilitation Services Pager [phone] Office [place] 06/28/2019, 11:58 AM

## 2019-06-28 NOTE — Progress Notes (Signed)
PROGRESS NOTE    [name]  QAS:[id number] DOB: 22-May-1929 DOA: 06/27/2019 PCP: [name], MD  Outpatient Specialists:   Brief Narrative:  As per H and P done by Dr. Cleaster Corin. Patel: [name] is a 86 y.o. male with medical history significant for COPD/chronic respiratory failure with hypoxia on 2 L supplemental O2 via [place], CAD s/p CABG, chronic diastolic CHF, PVD with right SFA occlusion, PAF, hypertension, hyperlipidemia, BPH, depression/anxiety, and hard of hearing who presents to the ED for evaluation of right foot wound.  Patient has had a chronic nonhealing right foot wound related to his significant peripheral vascular disease.  He underwent wound debridement on 06/23/2019.  Afterwards he has noticed clear drainage from debridement wound site as well as increased erythema and some desquamation of dorsal foot skin.  He has had increased pain at night and with direct stimulation around the wound site.  He has not had any subjective fevers, chills, diaphoresis.  He reports chronic intermittent chest pain and dyspnea related to his COPD which is not significantly changed.  He reports a history of difficulty with urine output which has improved significantly since starting Rapaflo recently.  06/28/2019: Patient seen alongside patient's wife.  Patient is hard of hearing.  Patient's wife provided most of the history.  Also discussed with patient's son who is a Stage manager at [employer].  Patient has no new complaints.  Will consult the vascular surgery team to assist in directing patient's care.  Apparently, patient is well-known to Dr. Deitra Mayo, local vascular surgeon.  Patient's wife tells me that patient was informed that he was not a good surgical candidate.  Hopefully, the vascular surgery team with direct patient's care.    Assessment & Plan:   Principal Problem:   Cellulitis of right lower extremity Active Problems:   Hyperlipidemia   Essential hypertension    Chronic respiratory failure with hypoxia and hypercapnia (HCC)   Coronary artery disease involving coronary bypass graft of native heart with angina pectoris (HCC)   Peripheral vascular disease (HCC)   BPH (benign prostatic hyperplasia)   GERD (gastroesophageal reflux disease)   Anxiety   Chronic diastolic CHF (congestive heart failure) (HCC)   COPD (chronic obstructive pulmonary disease) ([place])   AKI (acute kidney injury) ([place])   Cellulitis of left lower extremity   PVD with total occlusion of right SFA (with chronic nonhealing right foot wound and skin changes involving the right lower leg): -Patient's problem is likely secondary to the peripheral vascular disease. -Patient is well-known to Dr. Deitra Mayo. -I have consulted vascular surgery team.  I personally discussed with Dr. Servando Snare, vascular surgeon.  Dr. Donzetta Matters has kindly agreed to see patient.   -Further management will depend on vascular team's recommendation.    AKI: Serum creatinine on presentation was 1.44.   Serum creatinine today is 1.14.   AKI has improved significantly.  This is closer to patient's baseline.   Continue to hold Lasix and Aldactone for now.    COPD/chronic respiratory failure: -Uses 2 L of home O2 via [place] at home.   -Currently stable. -Continue supplemental oxygen -Continue Xopenex nebulizers as needed, Symbicort, Spiriva -Patient uses trilogy at home nightly  Chronic diastolic CHF: -Stable.   -Patient has bilateral lower extremity edema, worse on the right side, suspect chronic.    CAD s/p CABG Hyperlipidemia: Chronic and stable.  Denies any chest pain. -Continue aspirin and statin  Paroxysmal atrial fibrillation: In sinus rhythm.  Not on anticoagulation.  Continue aspirin 81 mg alone.  Holding home Toprol-XL due to to soft blood pressure.  Hypertension: -Blood pressure has improved. -We will introduce Toprol-XL. -Continue to monitor blood pressure.   BPH: Continue  Proscar.  GERD: Continue Pepcid.  Depression/anxiety: Continue Xanax as needed.    DVT prophylaxis: Subcutaneous heparin. Code Status: Partial code Family Communication: Wife and patient's son. Disposition Plan: This will depend on hospital course.   Consultants:   Vascular surgery (Dr. Truddie Hidden)  Procedures:   None  Antimicrobials:   IV Rocephin (gas current skin changes is secondary to vascular ischemia)    Subjective: No fever or chills No chest pain No shortness of breath No new complaints  Objective: Vitals:   06/28/19 0500 06/28/19 0515 06/28/19 0902 06/28/19 1219  BP:  127/76 (!) 145/76 108/60  Pulse:  69 73 75  Resp:  20 16 16   Temp:  97.9 F (36.6 C) 97.8 F (36.6 C) 97.9 F (36.6 C)  TempSrc:  Oral Oral Oral  SpO2:  99% 100% 100%  Weight: 62.9 kg       Intake/Output Summary (Last 24 hours) at 06/28/2019 1313 Last data filed at 06/28/2019 0752 Gross per 24 hour  Intake 1164.25 ml  Output 500 ml  Net 664.25 ml   Filed Weights   06/28/19 0500  Weight: 62.9 kg    Examination: General exam: Appears calm and comfortable.  Hard of hearing.  Chronically ill looking. Respiratory system: Clear to auscultation.  Cardiovascular system: S1 & S2 heard Gastrointestinal system: Abdomen is nondistended, soft and nontender. No organomegaly or masses felt. Normal bowel sounds heard. Central nervous system: Alert and oriented.  Hard of hearing.  Patient moves all extremities. Extremities/Skin:  -Bilateral lower extremity edema, worse on the right side.  Suspect edema is chronic         Data Reviewed: I have personally reviewed following labs and imaging studies  CBC: Recent Labs  Lab 06/25/19 1449 06/27/19 1422 06/28/19 0231  WBC 13.0*  13.2* 10.2 9.7  NEUTROABS 11.5* 8.2*  --   HGB 12.4* 12.6* 11.0*  HCT 36.6* 39.1 34.7*  MCV 96 99.7 99.7  PLT 208 206 825   Basic Metabolic Panel: Recent Labs  Lab 06/25/19 1449  06/27/19 1422 06/28/19 0231  NA 139 140 139  K 3.8 3.6 4.1  CL 95* 96* 97*  CO2 29 33* 33*  GLUCOSE 123* 139* 85  BUN 29* 26* 23  CREATININE 1.28* 1.44* 1.14  CALCIUM 9.8 9.5 9.2   GFR: Estimated Creatinine Clearance: 40.6 mL/min (by C-G formula based on SCr of 1.14 mg/dL). Liver Function Tests: Recent Labs  Lab 06/27/19 1422  AST 22  ALT 18  ALKPHOS 59  BILITOT 0.8  PROT 5.9*  ALBUMIN 3.8   No results for input(s): LIPASE, AMYLASE in the last 168 hours. No results for input(s): AMMONIA in the last 168 hours. Coagulation Profile: Recent Labs  Lab 06/27/19 1422  INR 1.0   Cardiac Enzymes: No results for input(s): CKTOTAL, CKMB, CKMBINDEX, TROPONINI in the last 168 hours. BNP (last 3 results) Recent Labs    07/10/18 1604  PROBNP 648*   HbA1C: No results for input(s): HGBA1C in the last 72 hours. CBG: No results for input(s): GLUCAP in the last 168 hours. Lipid Profile: No results for input(s): CHOL, HDL, LDLCALC, TRIG, CHOLHDL, LDLDIRECT in the last 72 hours. Thyroid Function Tests: No results for input(s): TSH, T4TOTAL, FREET4, T3FREE, THYROIDAB in the last 72 hours. Anemia Panel: No results  for input(s): VITAMINB12, FOLATE, FERRITIN, TIBC, IRON, RETICCTPCT in the last 72 hours. Urine analysis:    Component Value Date/Time   COLORURINE YELLOW 06/27/2019 1546   APPEARANCEUR CLEAR 06/27/2019 1546   LABSPEC 1.009 06/27/2019 1546   PHURINE 5.0 06/27/2019 1546   GLUCOSEU NEGATIVE 06/27/2019 1546   HGBUR LARGE (A) 06/27/2019 1546   BILIRUBINUR NEGATIVE 06/27/2019 1546   KETONESUR NEGATIVE 06/27/2019 1546   PROTEINUR NEGATIVE 06/27/2019 1546   NITRITE NEGATIVE 06/27/2019 1546   LEUKOCYTESUR NEGATIVE 06/27/2019 1546   Sepsis Labs: @LABRCNTIP (procalcitonin:4,lacticidven:4)  ) Recent Results (from the past 240 hour(s))  Culture, blood (Routine x 2)     Status: None (Preliminary result)   Collection Time: 06/27/19  1:45 PM   Specimen: BLOOD  Result Value  Ref Range Status   Specimen Description BLOOD SITE NOT SPECIFIED  Final   Special Requests   Final    BOTTLES DRAWN AEROBIC AND ANAEROBIC Blood Culture adequate volume   Culture   Final    NO GROWTH < 24 HOURS Performed at [facility], [place] [address] [place], [place] [address]    Report Status PENDING  Incomplete  Culture, blood (Routine x 2)     Status: None (Preliminary result)   Collection Time: 06/27/19  4:56 PM   Specimen: BLOOD  Result Value Ref Range Status   Specimen Description BLOOD BLOOD LEFT FOREARM  Final   Special Requests   Final    BOTTLES DRAWN AEROBIC AND ANAEROBIC Blood Culture results may not be optimal due to an excessive volume of blood received in culture bottles   Culture   Final    NO GROWTH < 24 HOURS Performed at [facility], [place] [address] [place], [place] [address]    Report Status PENDING  Incomplete  SARS Coronavirus 2 by RT PCR (hospital order, performed in [place] hospital lab) Nasopharyngeal Nasopharyngeal Swab     Status: None   Collection Time: 06/27/19  7:01 PM   Specimen: Nasopharyngeal Swab  Result Value Ref Range Status   SARS Coronavirus 2 NEGATIVE NEGATIVE Final    Comment: (NOTE) SARS-CoV-2 target nucleic acids are NOT DETECTED.  The SARS-CoV-2 RNA is generally detectable in upper and lower respiratory specimens during the acute phase of infection. The lowest concentration of SARS-CoV-2 viral copies this assay can detect is 250 copies / mL. A negative result does not preclude SARS-CoV-2 infection and should not be used as the sole basis for treatment or other patient management decisions.  A negative result may occur with improper specimen collection / handling, submission of specimen other than nasopharyngeal swab, presence of viral mutation(s) within the areas targeted by this assay, and inadequate number of viral copies (<250 copies / mL). A negative result must be combined with clinical observations, patient  history, and epidemiological information.  Fact Sheet for Patients:   StrictlyIdeas.no  Fact Sheet for Healthcare Providers: BankingDealers.co.za  This test is not yet approved or  cleared by the [place] FDA and has been authorized for detection and/or diagnosis of SARS-CoV-2 by FDA under an Emergency Use Authorization (EUA).  This EUA will remain in effect (meaning this test can be used) for the duration of the COVID-19 declaration under Section 564(b)(1) of the Act, 21 U.S.C. section 360bbb-3(b)(1), unless the authorization is terminated or revoked sooner.  Performed at [facility], [place] [address], [place], [place] [address]          Radiology Studies: DG Chest 2  View  Result Date: 06/27/2019 CLINICAL DATA:  Possible sepsis.  Nonhealing right foot wounds. EXAM: CHEST - 2 VIEW COMPARISON:  Radiographs 02/11/2019.  CT 03/08/2017. FINDINGS: The heart size and mediastinal contours are stable status post median sternotomy and CABG. The lungs are hyperinflated with chronic biapical scarring and blunting of both costophrenic angles. No edema, confluent airspace opacity, pleural effusion or pneumothorax. The bones appear unchanged. IMPRESSION: Stable postoperative chest with evidence of chronic obstructive pulmonary disease. No acute cardiopulmonary process. Electronically Signed   By: Richardean Sale M.D.   On: 06/27/2019 14:29   DG Ankle Complete Right  Result Date: 06/27/2019 CLINICAL DATA:  Right foot wound infection. EXAM: RIGHT ANKLE - COMPLETE 3+ VIEW COMPARISON:  None. FINDINGS: There is no evidence of fracture, dislocation, or joint effusion. There is no evidence of arthropathy or other focal bone abnormality. Moderate to marked severity diffuse soft tissue swelling is seen. There is mild vascular calcification. IMPRESSION: Moderate to marked severity diffuse soft tissue swelling without acute osseous abnormality.  Electronically Signed   By: Virgina Norfolk M.D.   On: 06/27/2019 16:32   DG Foot Complete Right  Result Date: 06/27/2019 CLINICAL DATA:  Right foot wound infection. EXAM: RIGHT FOOT COMPLETE - 3+ VIEW COMPARISON:  None. FINDINGS: There is no evidence of fracture or dislocation. There is no evidence of arthropathy or other focal bone abnormality. Marked severity soft tissue swelling is seen involving predominantly the dorsal aspect of the mid and distal right foot. Mild to moderate severity vascular calcification is noted. IMPRESSION: Marked severity soft tissue swelling without evidence of acute osseous abnormality. Electronically Signed   By: Virgina Norfolk M.D.   On: 06/27/2019 16:31   VAS [place] LOWER EXTREMITY VENOUS (DVT) (ONLY [facility] & [facility] 7a-7p)  Result Date: 06/27/2019  Lower Venous DVTStudy Indications: Edema. Other Indications: Known severe multilevel arterial occlusive disease. Comparison Study: no prior Performing Technologist: Abram Sander RVS  Examination Guidelines: A complete evaluation includes B-mode imaging, spectral Doppler, color Doppler, and power Doppler as needed of all accessible portions of each vessel. Bilateral testing is considered an integral part of a complete examination. Limited examinations for reoccurring indications may be performed as noted. The reflux portion of the exam is performed with the patient in reverse Trendelenburg.  +---------+---------------+---------+-----------+----------+--------------+ RIGHT    CompressibilityPhasicitySpontaneityPropertiesThrombus Aging +---------+---------------+---------+-----------+----------+--------------+ CFV      Full           Yes      Yes                                 +---------+---------------+---------+-----------+----------+--------------+ SFJ      Full                                                        +---------+---------------+---------+-----------+----------+--------------+ FV Prox  Full                                                         +---------+---------------+---------+-----------+----------+--------------+ FV Mid   Full                                                        +---------+---------------+---------+-----------+----------+--------------+  FV DistalFull                                                        +---------+---------------+---------+-----------+----------+--------------+ PFV      Full                                                        +---------+---------------+---------+-----------+----------+--------------+ POP      Full           Yes      Yes                                 +---------+---------------+---------+-----------+----------+--------------+ PTV      Full                                                        +---------+---------------+---------+-----------+----------+--------------+ PERO                                                  Not visualized +---------+---------------+---------+-----------+----------+--------------+   +----+---------------+---------+-----------+----------+--------------+ LEFTCompressibilityPhasicitySpontaneityPropertiesThrombus Aging +----+---------------+---------+-----------+----------+--------------+ CFV Full           Yes      Yes                                 +----+---------------+---------+-----------+----------+--------------+     Summary: RIGHT: - There is no evidence of deep vein thrombosis in the lower extremity.  - No cystic structure found in the popliteal fossa.  LEFT: - No evidence of common femoral vein obstruction.  *See table(s) above for measurements and observations. Electronically signed by Servando Snare MD on 06/27/2019 at 4:30:51 PM.    Final         Scheduled Meds: . aspirin EC  81 mg Oral Daily  . atorvastatin  20 mg Oral Daily  . famotidine  20 mg Oral Daily  . finasteride  5 mg Oral q morning - 10a  . heparin  5,000 Units Subcutaneous Q8H  .  metoprolol succinate  25 mg Oral Daily  . mometasone-formoterol  2 puff Inhalation BID  . predniSONE  10 mg Oral Q breakfast  . umeclidinium bromide  1 puff Inhalation Daily   Continuous Infusions: . cefTRIAXone (ROCEPHIN)  IV Stopped (06/27/19 2200)     LOS: 0 days    Time spent: 35 minutes   Dana Allan, MD  [facility] Pager #: [phone] 7PM-7AM contact night coverage as above

## 2019-06-29 DIAGNOSIS — I998 Other disorder of circulatory system: Secondary | ICD-10-CM

## 2019-06-29 DIAGNOSIS — L8990 Pressure ulcer of unspecified site, unspecified stage: Secondary | ICD-10-CM | POA: Insufficient documentation

## 2019-06-29 NOTE — Progress Notes (Signed)
Manufacturing engineer [facility]) Community Based Palliative Care  Pt is followed outpatient with palliative care.   Hospice has been recommended in the past, but the patient is not ready for this yet.  Please reach out if we can be of assistance with discharge plans.  Venia Carbon RN, BSN, [facility] Liaison

## 2019-06-29 NOTE — Progress Notes (Signed)
  Progress Note    06/29/2019 10:37 AM   Subjective: Having pain right foot also anxious about procedure  Vitals:   06/29/19 0016 06/29/19 0501  BP: 99/61 116/66  Pulse: 65 68  Resp: 16 17  Temp: 97.7 F (36.5 C) 97.9 F (36.6 C)  SpO2: 98% 92%    Physical Exam: Awake alert oriented Nonlabored respirations on nasal cannula Left brachial radial artery pulses are easily palpable  CBC    Component Value Date/Time   WBC 9.7 06/28/2019 0231   RBC 3.48 (L) 06/28/2019 0231   HGB 11.0 (L) 06/28/2019 0231   HGB 12.4 (L) 06/25/2019 1449   HCT 34.7 (L) 06/28/2019 0231   HCT 36.6 (L) 06/25/2019 1449   PLT 182 06/28/2019 0231   PLT 208 06/25/2019 1449   MCV 99.7 06/28/2019 0231   MCV 96 06/25/2019 1449   MCH 31.6 06/28/2019 0231   MCHC 31.7 06/28/2019 0231   RDW 12.4 06/28/2019 0231   RDW 12.2 06/25/2019 1449   LYMPHSABS 0.7 06/27/2019 1422   LYMPHSABS 0.4 (L) 06/25/2019 1449   MONOABS 1.1 (H) 06/27/2019 1422   EOSABS 0.1 06/27/2019 1422   EOSABS 0.0 06/25/2019 1449   BASOSABS 0.1 06/27/2019 1422   BASOSABS 0.1 06/25/2019 1449    BMET    Component Value Date/Time   NA 139 06/28/2019 0231   NA 139 06/25/2019 1449   K 4.1 06/28/2019 0231   CL 97 (L) 06/28/2019 0231   CO2 33 (H) 06/28/2019 0231   GLUCOSE 85 06/28/2019 0231   BUN 23 06/28/2019 0231   BUN 29 (H) 06/25/2019 1449   CREATININE 1.14 06/28/2019 0231   CALCIUM 9.2 06/28/2019 0231   GFRNONAA 58 (L) 06/28/2019 0231   GFRAA >60 06/28/2019 0231    INR    Component Value Date/Time   INR 1.0 06/27/2019 1422     Intake/Output Summary (Last 24 hours) at 06/29/2019 1037 Last data filed at 06/29/2019 0100 Gross per 24 hour  Intake 317.01 ml  Output 750 ml  Net -432.99 ml     Assessment/plan:  86 y.o. male is here with right foot critical limb ischemia.  I again reviewed his CT scan from 2019 which demonstrates heavy calcification of his aorta probable occlusion of his right external iliac artery and  probable occlusion of his right common femoral artery.  We will plan for left brachial artery approach CO2 and contrasted angiogram tomorrow to see if there are any surgical options for this patient given his critical limb ischemia and inability to heal an above-knee amputation.  I again discussed at length with the patient and his wife the procedure and we will plan to proceed tomorrow.  N.p.o. past midnight.   Asheton Scheffler C. Donzetta Matters, MD [facility] Office: [phone] Pager: [phone]  06/29/2019 10:37 AM

## 2019-06-29 NOTE — Progress Notes (Signed)
PROGRESS NOTE    [name]  UUE:[id number] DOB: 1929/03/18 DOA: 06/27/2019 PCP: [name], MD  Outpatient Specialists:   Brief Narrative:  As per H and P done by Dr. Cleaster Corin. Patel: [name] is a 86 y.o. male with medical history significant for COPD/chronic respiratory failure with hypoxia on 2 L supplemental O2 via [place], CAD s/p CABG, chronic diastolic CHF, PVD with right SFA occlusion, PAF, hypertension, hyperlipidemia, BPH, depression/anxiety, and hard of hearing who presents to the ED for evaluation of right foot wound.  Patient has had a chronic nonhealing right foot wound related to his significant peripheral vascular disease.  He underwent wound debridement on 06/23/2019.  Afterwards he has noticed clear drainage from debridement wound site as well as increased erythema and some desquamation of dorsal foot skin.  He has had increased pain at night and with direct stimulation around the wound site.  He has not had any subjective fevers, chills, diaphoresis.  He reports chronic intermittent chest pain and dyspnea related to his COPD which is not significantly changed.  He reports a history of difficulty with urine output which has improved significantly since starting Rapaflo recently.  06/28/2019: Patient seen alongside patient's wife.  Patient is hard of hearing.  Patient's wife provided most of the history.  Also discussed with patient's son who is a Stage manager at [employer].  Patient has no new complaints.  Will consult the vascular surgery team to assist in directing patient's care.  Apparently, patient is well-known to Dr. Deitra Mayo, local vascular surgeon.  Patient's wife tells me that patient was informed that he was not a good surgical candidate.  Hopefully, the vascular surgery team with direct patient's care.  06/29/2019: Patient seen alongside patient's wife.  Improved from the vascular surgery team is appreciated.  Further vascular work-up is planned for  tomorrow.  Acute kidney injury has improved significantly, and patient's renal function is essentially back to his baseline.  Assessment & Plan:   Principal Problem:   Cellulitis of right lower extremity Active Problems:   Hyperlipidemia   Essential hypertension   Chronic respiratory failure with hypoxia and hypercapnia (HCC)   Coronary artery disease involving coronary bypass graft of native heart with angina pectoris (HCC)   Peripheral vascular disease (HCC)   BPH (benign prostatic hyperplasia)   GERD (gastroesophageal reflux disease)   Anxiety   Chronic diastolic CHF (congestive heart failure) (HCC)   COPD (chronic obstructive pulmonary disease) ([place])   AKI (acute kidney injury) ([place])   Cellulitis of left lower extremity   Pressure injury of skin   PVD with total occlusion of right SFA (with chronic nonhealing right foot wound and skin changes involving the right lower leg): -Patient's problem is likely secondary to the peripheral vascular disease. -Patient is well-known to Dr. Deitra Mayo. -I have consulted vascular surgery team.  I personally discussed with Dr. Servando Snare, vascular surgeon.  Dr. Donzetta Matters has kindly agreed to see patient.   -For further vascular surgery work-up/procedure (please see vascular surgery, Dr. Claretha Cooper recommendation).     AKI: Serum creatinine on presentation was 1.44.   Serum creatinine today is 1.14.   AKI has improved significantly.  This is closer to patient's baseline.   Continue to hold Lasix and Aldactone for now. 06/29/2019: AKI has resolved significantly.  Continue to hold Lasix as patient may be exposed to contrast dye.  COPD/chronic respiratory failure: -Uses 2 L of home O2 via [place] at home.   -Currently stable. -  Continue supplemental oxygen -Continue Xopenex nebulizers as needed, Symbicort, Spiriva -Patient uses trilogy at home nightly  Chronic diastolic CHF: -Stable.   -Patient has bilateral lower extremity edema, worse on  the right side, suspect chronic.    CAD s/p CABG Hyperlipidemia: Chronic and stable.  Denies any chest pain. -Continue aspirin and statin  Paroxysmal atrial fibrillation: In sinus rhythm.  Not on anticoagulation.  Continue aspirin 81 mg alone.  Holding home Toprol-XL due to to soft blood pressure.  Hypertension: -Blood pressure has improved. -We will introduce Toprol-XL. -Continue to monitor blood pressure.   BPH: Continue Proscar.  GERD: Continue Pepcid.  Depression/anxiety: Continue Xanax as needed.    DVT prophylaxis: Subcutaneous heparin. Code Status: Partial code Family Communication: Wife and patient's son. Disposition Plan: This will depend on hospital course.   Consultants:   Vascular surgery (Dr. Truddie Hidden)  Procedures:   None  Antimicrobials:   IV Rocephin (gas current skin changes is secondary to vascular ischemia)    Subjective: No fever or chills No chest pain No shortness of breath No new complaints  Objective: Vitals:   06/28/19 1813 06/29/19 0016 06/29/19 0501 06/29/19 1224  BP: 120/71 99/61 116/66 100/63  Pulse: 81 65 68 74  Resp: 16 16 17 18   Temp: 97.9 F (36.6 C) 97.7 F (36.5 C) 97.9 F (36.6 C) 97.6 F (36.4 C)  TempSrc: Oral Oral Oral Oral  SpO2: 100% 98% 92% 99%  Weight:        Intake/Output Summary (Last 24 hours) at 06/29/2019 1533 Last data filed at 06/29/2019 0100 Gross per 24 hour  Intake 220 ml  Output 500 ml  Net -280 ml   Filed Weights   06/28/19 0500  Weight: 62.9 kg    Examination: General exam: Appears calm and comfortable.  Hard of hearing.  Chronically ill looking. Respiratory system: Clear to auscultation.  Cardiovascular system: S1 & S2 heard Gastrointestinal system: Abdomen is nondistended, soft and nontender. No organomegaly or masses felt. Normal bowel sounds heard. Central nervous system: Alert and oriented.  Hard of hearing.  Patient moves all extremities. Extremities/Skin:    -Bilateral lower extremity edema, worse on the right side.  Suspect edema is chronic         Data Reviewed: I have personally reviewed following labs and imaging studies  CBC: Recent Labs  Lab 06/25/19 1449 06/27/19 1422 06/28/19 0231  WBC 13.0*  13.2* 10.2 9.7  NEUTROABS 11.5* 8.2*  --   HGB 12.4* 12.6* 11.0*  HCT 36.6* 39.1 34.7*  MCV 96 99.7 99.7  PLT 208 206 161   Basic Metabolic Panel: Recent Labs  Lab 06/25/19 1449 06/27/19 1422 06/28/19 0231  NA 139 140 139  K 3.8 3.6 4.1  CL 95* 96* 97*  CO2 29 33* 33*  GLUCOSE 123* 139* 85  BUN 29* 26* 23  CREATININE 1.28* 1.44* 1.14  CALCIUM 9.8 9.5 9.2   GFR: Estimated Creatinine Clearance: 40.6 mL/min (by C-G formula based on SCr of 1.14 mg/dL). Liver Function Tests: Recent Labs  Lab 06/27/19 1422  AST 22  ALT 18  ALKPHOS 59  BILITOT 0.8  PROT 5.9*  ALBUMIN 3.8   No results for input(s): LIPASE, AMYLASE in the last 168 hours. No results for input(s): AMMONIA in the last 168 hours. Coagulation Profile: Recent Labs  Lab 06/27/19 1422  INR 1.0   Cardiac Enzymes: No results for input(s): CKTOTAL, CKMB, CKMBINDEX, TROPONINI in the last 168 hours. BNP (last 3 results) Recent Labs  07/10/18 1604  PROBNP 648*   HbA1C: No results for input(s): HGBA1C in the last 72 hours. CBG: No results for input(s): GLUCAP in the last 168 hours. Lipid Profile: No results for input(s): CHOL, HDL, LDLCALC, TRIG, CHOLHDL, LDLDIRECT in the last 72 hours. Thyroid Function Tests: No results for input(s): TSH, T4TOTAL, FREET4, T3FREE, THYROIDAB in the last 72 hours. Anemia Panel: No results for input(s): VITAMINB12, FOLATE, FERRITIN, TIBC, IRON, RETICCTPCT in the last 72 hours. Urine analysis:    Component Value Date/Time   COLORURINE YELLOW 06/27/2019 1546   APPEARANCEUR CLEAR 06/27/2019 1546   LABSPEC 1.009 06/27/2019 1546   PHURINE 5.0 06/27/2019 1546   GLUCOSEU NEGATIVE 06/27/2019 1546   HGBUR LARGE (A)  06/27/2019 1546   BILIRUBINUR NEGATIVE 06/27/2019 1546   KETONESUR NEGATIVE 06/27/2019 1546   PROTEINUR NEGATIVE 06/27/2019 1546   NITRITE NEGATIVE 06/27/2019 1546   LEUKOCYTESUR NEGATIVE 06/27/2019 1546   Sepsis Labs: @LABRCNTIP (procalcitonin:4,lacticidven:4)  ) Recent Results (from the past 240 hour(s))  Culture, blood (Routine x 2)     Status: None (Preliminary result)   Collection Time: 06/27/19  1:45 PM   Specimen: BLOOD  Result Value Ref Range Status   Specimen Description BLOOD SITE NOT SPECIFIED  Final   Special Requests   Final    BOTTLES DRAWN AEROBIC AND ANAEROBIC Blood Culture adequate volume   Culture   Final    NO GROWTH 2 DAYS Performed at [facility], [place] [address], [place], [place] [address]    Report Status PENDING  Incomplete  Culture, blood (Routine x 2)     Status: None (Preliminary result)   Collection Time: 06/27/19  4:56 PM   Specimen: BLOOD  Result Value Ref Range Status   Specimen Description BLOOD BLOOD LEFT FOREARM  Final   Special Requests   Final    BOTTLES DRAWN AEROBIC AND ANAEROBIC Blood Culture results may not be optimal due to an excessive volume of blood received in culture bottles   Culture   Final    NO GROWTH 2 DAYS Performed at [facility], [place] [address], [place], [place] [address]    Report Status PENDING  Incomplete  SARS Coronavirus 2 by RT PCR (hospital order, performed in [facility] hospital lab) Nasopharyngeal Nasopharyngeal Swab     Status: None   Collection Time: 06/27/19  7:01 PM   Specimen: Nasopharyngeal Swab  Result Value Ref Range Status   SARS Coronavirus 2 NEGATIVE NEGATIVE Final    Comment: (NOTE) SARS-CoV-2 target nucleic acids are NOT DETECTED.  The SARS-CoV-2 RNA is generally detectable in upper and lower respiratory specimens during the acute phase of infection. The lowest concentration of SARS-CoV-2 viral copies this assay can detect is 250 copies / mL. A negative result does not preclude  SARS-CoV-2 infection and should not be used as the sole basis for treatment or other patient management decisions.  A negative result may occur with improper specimen collection / handling, submission of specimen other than nasopharyngeal swab, presence of viral mutation(s) within the areas targeted by this assay, and inadequate number of viral copies (<250 copies / mL). A negative result must be combined with clinical observations, patient history, and epidemiological information.  Fact Sheet for Patients:   StrictlyIdeas.no  Fact Sheet for Healthcare Providers: BankingDealers.co.za  This test is not yet approved or  cleared by the [place] FDA and has been authorized for detection and/or diagnosis of SARS-CoV-2 by FDA under an Emergency Use Authorization (EUA).  This EUA will remain in effect (meaning this test can be used) for the duration of the COVID-19 declaration under Section 564(b)(1) of the Act, 21 U.S.C. section 360bbb-3(b)(1), unless the authorization is terminated or revoked sooner.  Performed at [facility], [place] [address] [place], [place] [address]          Radiology Studies: DG Ankle Complete Right  Result Date: 06/27/2019 CLINICAL DATA:  Right foot wound infection. EXAM: RIGHT ANKLE - COMPLETE 3+ VIEW COMPARISON:  None. FINDINGS: There is no evidence of fracture, dislocation, or joint effusion. There is no evidence of arthropathy or other focal bone abnormality. Moderate to marked severity diffuse soft tissue swelling is seen. There is mild vascular calcification. IMPRESSION: Moderate to marked severity diffuse soft tissue swelling without acute osseous abnormality. Electronically Signed   By: Virgina Norfolk M.D.   On: 06/27/2019 16:32   DG Foot Complete Right  Result Date: 06/27/2019 CLINICAL DATA:  Right foot wound infection. EXAM: RIGHT FOOT COMPLETE - 3+ VIEW COMPARISON:  None. FINDINGS: There is  no evidence of fracture or dislocation. There is no evidence of arthropathy or other focal bone abnormality. Marked severity soft tissue swelling is seen involving predominantly the dorsal aspect of the mid and distal right foot. Mild to moderate severity vascular calcification is noted. IMPRESSION: Marked severity soft tissue swelling without evidence of acute osseous abnormality. Electronically Signed   By: Virgina Norfolk M.D.   On: 06/27/2019 16:31   VAS [place] ABI WITH/WO TBI  Result Date: 06/29/2019 LOWER EXTREMITY DOPPLER STUDY Indications: Ulceration, and peripheral artery disease. High Risk Factors: Hypertension, hyperlipidemia, past history of smoking,                    coronary artery disease. Other Factors: COPD on home O2. Carotid artery disease. CHF.  Limitations: Today's exam was limited due to edema. Comparison Study: prior study from 11/13/18 is available for comparison Performing Technologist: Sharion Dove RVS  Examination Guidelines: A complete evaluation includes at minimum, Doppler waveform signals and systolic blood pressure reading at the level of bilateral brachial, anterior tibial, and posterior tibial arteries, when vessel segments are accessible. Bilateral testing is considered an integral part of a complete examination. Photoelectric Plethysmograph (PPG) waveforms and toe systolic pressure readings are included as required and additional duplex testing as needed. Limited examinations for reoccurring indications may be performed as noted.  ABI Findings: +---------+------------------+-----+---------+--------+ Right    Rt Pressure (mmHg)IndexWaveform Comment  +---------+------------------+-----+---------+--------+ Brachial 128                    triphasic         +---------+------------------+-----+---------+--------+ PTA      0                 0.00 absent            +---------+------------------+-----+---------+--------+ DP       0                 0.00 absent             +---------+------------------+-----+---------+--------+ Great Toe0                 0.00 Absent            +---------+------------------+-----+---------+--------+ +---------+------------------+-----+-------------------+-------+ Left     Lt Pressure (mmHg)IndexWaveform           Comment +---------+------------------+-----+-------------------+-------+ Brachial 123  triphasic                  +---------+------------------+-----+-------------------+-------+ PTA      254               1.98 dampened monophasic        +---------+------------------+-----+-------------------+-------+ DP       96                0.75 dampened monophasic        +---------+------------------+-----+-------------------+-------+ Great Toe26                0.20                            +---------+------------------+-----+-------------------+-------+ +-------+-----------+-----------+------------+------------+ ABI/TBIToday's ABIToday's TBIPrevious ABIPrevious TBI +-------+-----------+-----------+------------+------------+ Right  0          0          1.55        0            +-------+-----------+-----------+------------+------------+ Left   1.98       .20        1.55        0            +-------+-----------+-----------+------------+------------+ Arterial wall calcification precludes accurate ankle pressures and ABIs. Right ABIs appear decreased compared to prior study on 11/2018. Left ABIs appear essentially unchanged compared to prior study on 11/2018.  Summary: Right: Resting right ankle-brachial index indicates critical limb ischemia. The right toe-brachial index is abnormal. Left: Resting left ankle-brachial index indicates noncompressible left lower extremity arteries. The left toe-brachial index is abnormal.  *See table(s) above for measurements and observations.  Electronically signed by Servando Snare MD on 06/29/2019 at 10:55:26 AM.    Final    VAS [place] LOWER  EXTREMITY VENOUS (DVT) (ONLY [facility] & [facility] 7a-7p)  Result Date: 06/27/2019  Lower Venous DVTStudy Indications: Edema. Other Indications: Known severe multilevel arterial occlusive disease. Comparison Study: no prior Performing Technologist: Abram Sander RVS  Examination Guidelines: A complete evaluation includes B-mode imaging, spectral Doppler, color Doppler, and power Doppler as needed of all accessible portions of each vessel. Bilateral testing is considered an integral part of a complete examination. Limited examinations for reoccurring indications may be performed as noted. The reflux portion of the exam is performed with the patient in reverse Trendelenburg.  +---------+---------------+---------+-----------+----------+--------------+ RIGHT    CompressibilityPhasicitySpontaneityPropertiesThrombus Aging +---------+---------------+---------+-----------+----------+--------------+ CFV      Full           Yes      Yes                                 +---------+---------------+---------+-----------+----------+--------------+ SFJ      Full                                                        +---------+---------------+---------+-----------+----------+--------------+ FV Prox  Full                                                        +---------+---------------+---------+-----------+----------+--------------+ FV  Mid   Full                                                        +---------+---------------+---------+-----------+----------+--------------+ FV DistalFull                                                        +---------+---------------+---------+-----------+----------+--------------+ PFV      Full                                                        +---------+---------------+---------+-----------+----------+--------------+ POP      Full           Yes      Yes                                  +---------+---------------+---------+-----------+----------+--------------+ PTV      Full                                                        +---------+---------------+---------+-----------+----------+--------------+ PERO                                                  Not visualized +---------+---------------+---------+-----------+----------+--------------+   +----+---------------+---------+-----------+----------+--------------+ LEFTCompressibilityPhasicitySpontaneityPropertiesThrombus Aging +----+---------------+---------+-----------+----------+--------------+ CFV Full           Yes      Yes                                 +----+---------------+---------+-----------+----------+--------------+     Summary: RIGHT: - There is no evidence of deep vein thrombosis in the lower extremity.  - No cystic structure found in the popliteal fossa.  LEFT: - No evidence of common femoral vein obstruction.  *See table(s) above for measurements and observations. Electronically signed by Servando Snare MD on 06/27/2019 at 4:30:51 PM.    Final         Scheduled Meds: . aspirin EC  81 mg Oral Daily  . atorvastatin  20 mg Oral Daily  . famotidine  20 mg Oral Daily  . finasteride  5 mg Oral q morning - 10a  . heparin  5,000 Units Subcutaneous Q8H  . metoprolol succinate  25 mg Oral Daily  . mometasone-formoterol  2 puff Inhalation BID  . predniSONE  10 mg Oral Q breakfast  . umeclidinium bromide  1 puff Inhalation Daily   Continuous Infusions: . cefTRIAXone (ROCEPHIN)  IV 1 g (06/28/19 2020)     LOS: 1 day    Time spent: 35 minutes   Dana Allan, MD  [facility] Pager #:  [phone] 7PM-7AM contact night coverage as above

## 2019-06-30 ENCOUNTER — Encounter (HOSPITAL_BASED_OUTPATIENT_CLINIC_OR_DEPARTMENT_OTHER): Payer: Medicare Other | Admitting: Internal Medicine

## 2019-06-30 ENCOUNTER — Encounter: Payer: Self-pay | Admitting: *Deleted

## 2019-06-30 ENCOUNTER — Encounter (HOSPITAL_COMMUNITY)
Admission: EM | Disposition: A | Discharge status: Home Health | Payer: Self-pay | Source: Home / Self Care | Attending: Internal Medicine

## 2019-06-30 ENCOUNTER — Other Ambulatory Visit: Payer: Self-pay

## 2019-06-30 ENCOUNTER — Ambulatory Visit: Payer: Medicare Other | Admitting: Interventional Cardiology

## 2019-06-30 DIAGNOSIS — Z01810 Encounter for preprocedural cardiovascular examination: Secondary | ICD-10-CM

## 2019-06-30 HISTORY — PX: ABDOMINAL AORTOGRAM W/LOWER EXTREMITY: CATH118223

## 2019-06-30 LAB — CBC WITH DIFFERENTIAL/PLATELET
Abs Immature Granulocytes: 0.11 10*3/uL — ABNORMAL HIGH (ref 0.00–0.07)
Basophils Absolute: 0.1 10*3/uL (ref 0.0–0.1)
Basophils Relative: 1 %
Eosinophils Absolute: 0.1 10*3/uL (ref 0.0–0.5)
Eosinophils Relative: 1 %
HCT: 35.8 % — ABNORMAL LOW (ref 39.0–52.0)
Hemoglobin: 11.7 g/dL — ABNORMAL LOW (ref 13.0–17.0)
Immature Granulocytes: 1 %
Lymphocytes Relative: 8 %
Lymphs Abs: 0.8 10*3/uL (ref 0.7–4.0)
MCH: 32.7 pg (ref 26.0–34.0)
MCHC: 32.7 g/dL (ref 30.0–36.0)
MCV: 100 fL (ref 80.0–100.0)
Monocytes Absolute: 1.2 10*3/uL — ABNORMAL HIGH (ref 0.1–1.0)
Monocytes Relative: 12 %
Neutro Abs: 7.8 10*3/uL — ABNORMAL HIGH (ref 1.7–7.7)
Neutrophils Relative %: 77 %
Platelets: 193 10*3/uL (ref 150–400)
RBC: 3.58 MIL/uL — ABNORMAL LOW (ref 4.22–5.81)
RDW: 12.5 % (ref 11.5–15.5)
WBC: 10.1 10*3/uL (ref 4.0–10.5)
nRBC: 0 % (ref 0.0–0.2)

## 2019-06-30 LAB — BASIC METABOLIC PANEL
Anion gap: 9 (ref 5–15)
BUN: 17 mg/dL (ref 8–23)
CO2: 29 mmol/L (ref 22–32)
Calcium: 8.7 mg/dL — ABNORMAL LOW (ref 8.9–10.3)
Chloride: 101 mmol/L (ref 98–111)
Creatinine, Ser: 0.96 mg/dL (ref 0.61–1.24)
GFR calc Af Amer: >60 mL/min (ref >60)
GFR calc non Af Amer: >60 mL/min (ref >60)
Glucose, Bld: 93 mg/dL (ref 70–99)
Potassium: 3.9 mmol/L (ref 3.5–5.1)
Sodium: 139 mmol/L (ref 135–145)

## 2019-06-30 LAB — PHOSPHORUS: Phosphorus: 2.6 mg/dL (ref 2.5–4.6)

## 2019-06-30 LAB — POCT ACTIVATED CLOTTING TIME: Activated Clotting Time: 175 seconds

## 2019-06-30 LAB — MAGNESIUM: Magnesium: 2 mg/dL (ref 1.7–2.4)

## 2019-06-30 SURGERY — ABDOMINAL AORTOGRAM W/LOWER EXTREMITY
Anesthesia: LOCAL | Laterality: Bilateral

## 2019-06-30 MED ORDER — HEPARIN (PORCINE) IN NACL 1000-0.9 UT/500ML-% IV SOLN
INTRAVENOUS | Status: AC
  Filled 2019-06-30: qty 500

## 2019-06-30 MED ORDER — LIDOCAINE HCL (PF) 1 % IJ SOLN
INTRAMUSCULAR | Status: AC
  Filled 2019-06-30: qty 30

## 2019-06-30 MED ORDER — FENTANYL CITRATE (PF) 100 MCG/2ML IJ SOLN
INTRAMUSCULAR | Status: DC | PRN
  Administered 2019-06-30 (×2): 25 ug via INTRAVENOUS

## 2019-06-30 MED ORDER — FENTANYL CITRATE (PF) 100 MCG/2ML IJ SOLN
INTRAMUSCULAR | Status: AC
  Filled 2019-06-30: qty 2

## 2019-06-30 MED ORDER — MIDAZOLAM HCL 2 MG/2ML IJ SOLN
INTRAMUSCULAR | Status: AC
  Filled 2019-06-30: qty 2

## 2019-06-30 MED ORDER — MIDAZOLAM HCL 2 MG/2ML IJ SOLN
INTRAMUSCULAR | Status: DC | PRN
  Administered 2019-06-30 (×2): 1 mg via INTRAVENOUS

## 2019-06-30 MED ORDER — SODIUM CHLORIDE 0.9% FLUSH
3.0000 mL | Freq: Two times a day (BID) | INTRAVENOUS | Status: DC
  Administered 2019-07-01 – 2019-07-02 (×2): 3 mL via INTRAVENOUS

## 2019-06-30 MED ORDER — NITROGLYCERIN 1 MG/10 ML FOR IR/CATH LAB
INTRA_ARTERIAL | Status: DC | PRN
  Administered 2019-06-30: 200 ug

## 2019-06-30 MED ORDER — HEPARIN (PORCINE) IN NACL 1000-0.9 UT/500ML-% IV SOLN
INTRAVENOUS | Status: DC | PRN
  Administered 2019-06-30 (×2): 500 mL

## 2019-06-30 MED ORDER — SODIUM CHLORIDE 0.9 % IV SOLN: INTRAVENOUS | Status: AC

## 2019-06-30 MED ORDER — ACETAMINOPHEN 325 MG PO TABS
650.0000 mg | ORAL_TABLET | ORAL | Status: DC | PRN
  Administered 2019-07-01 – 2019-07-02 (×2): 650 mg via ORAL
  Filled 2019-06-30 (×2): qty 2

## 2019-06-30 MED ORDER — HYDRALAZINE HCL 20 MG/ML IJ SOLN: 5.0000 mg | INTRAMUSCULAR | Status: DC | PRN

## 2019-06-30 MED ORDER — ONDANSETRON HCL 4 MG/2ML IJ SOLN: 4.0000 mg | Freq: Four times a day (QID) | INTRAMUSCULAR | Status: DC | PRN

## 2019-06-30 MED ORDER — METOPROLOL SUCCINATE ER 25 MG PO TB24
12.5000 mg | ORAL_TABLET | Freq: Every day | ORAL | Status: DC
  Administered 2019-07-01 – 2019-07-02 (×2): 12.5 mg via ORAL
  Filled 2019-06-30 (×2): qty 1

## 2019-06-30 MED ORDER — NITROGLYCERIN 1 MG/10 ML FOR IR/CATH LAB
INTRA_ARTERIAL | Status: AC
  Filled 2019-06-30: qty 10

## 2019-06-30 MED ORDER — LIDOCAINE HCL (PF) 1 % IJ SOLN
INTRAMUSCULAR | Status: DC | PRN
  Administered 2019-06-30: 15 mL

## 2019-06-30 MED ORDER — SODIUM CHLORIDE 0.9 % IV SOLN: INTRAVENOUS | Status: DC

## 2019-06-30 MED ORDER — IODIXANOL 320 MG/ML IV SOLN
INTRAVENOUS | Status: DC | PRN
  Administered 2019-06-30: 117 mL via INTRA_ARTERIAL

## 2019-06-30 MED ORDER — SODIUM CHLORIDE 0.9 % IV SOLN: 250.0000 mL | INTRAVENOUS | Status: DC | PRN

## 2019-06-30 MED ORDER — HEPARIN SODIUM (PORCINE) 1000 UNIT/ML IJ SOLN
INTRAMUSCULAR | Status: AC
  Filled 2019-06-30: qty 1

## 2019-06-30 MED ORDER — HEPARIN SODIUM (PORCINE) 1000 UNIT/ML IJ SOLN
INTRAMUSCULAR | Status: DC | PRN
  Administered 2019-06-30: 2000 [IU] via INTRAVENOUS

## 2019-06-30 MED ORDER — LABETALOL HCL 5 MG/ML IV SOLN: 10.0000 mg | INTRAVENOUS | Status: DC | PRN

## 2019-06-30 MED ORDER — SODIUM CHLORIDE 0.9% FLUSH: 3.0000 mL | INTRAVENOUS | Status: DC | PRN

## 2019-06-30 SURGICAL SUPPLY — 12 items
CATH ANGIO 5F BER 100CM (CATHETERS) ×2 IMPLANT
CATH ANGIO 5F PIGTAIL 100CM (CATHETERS) ×2 IMPLANT
KIT MICROPUNCTURE NIT STIFF (SHEATH) ×2 IMPLANT
KIT PV (KITS) ×2 IMPLANT
SHEATH PINNACLE 5F 10CM (SHEATH) ×2 IMPLANT
SHEATH PROBE COVER 6X72 (BAG) ×2 IMPLANT
STOPCOCK MORSE 400PSI 3WAY (MISCELLANEOUS) ×2 IMPLANT
SYR MEDRAD MARK V 150ML (SYRINGE) ×2 IMPLANT
TRANSDUCER W/STOPCOCK (MISCELLANEOUS) ×2 IMPLANT
TRAY PV CATH (CUSTOM PROCEDURE TRAY) ×2 IMPLANT
TUBE CONN 8.8X1320 FR HP M-F (CONNECTOR) ×2 IMPLANT
WIRE HI TORQ VERSACORE J 260CM (WIRE) ×2 IMPLANT

## 2019-06-30 NOTE — Progress Notes (Signed)
Pt still thinking about surgical vs non surgical options for leg. Dr Scot Dock will see him again tomorrow.  Ruta Hinds MD

## 2019-06-30 NOTE — Progress Notes (Signed)
Pt arrived from cath lab. Alert and oriented x4. Vitals WDL. Family at bedside.

## 2019-06-30 NOTE — Consult Note (Signed)
[facility] Nurse Consult Note: Reason for Consult: Wounds on right foot. Patient was seen by this writer on Saturday, 06/28/19.  Please see note from that encounter.  Active Orders provide Nursing with guidance for the care of the wounds.  [facility] nursing team will not follow, but will remain available to this patient, the nursing and medical teams.  Please re-consult if needed. Thanks, Maudie Flakes, MSN, RN, [place], Arther Abbott  Pager# [phone]

## 2019-06-30 NOTE — Progress Notes (Signed)
Manufacturing engineer [facility]) Hospital Liaison Note  Visited with patient very briefly at bedside. At spouse's request conversation was moved to the hallway where we were joined by pt's daughter Yetta Flock.  Mrs. Flinchum shared three options for care as she understands them:  1.  No surgical procedures and go home with hospice;  2. AKA and go to rehab (she shared feeling that pt would never leave rehab/snf);  and, 3. A very risky venous bypass surgery that pt would not survive.  This RN shared explanation of care provided in home setting by hospice and discussed the aspects of care that would not be considered on the plan of care for comfort, such as Trelegy, but that would be available as self-pay. Mrs. Longanecker shared frustrations and challenges with care giving at home; this RN discussed some plans to address care needs if pt were to become bedbound.  Contact numbers for [facility] liaisons left.  Family asks for time to consider plan of care and reports plan to reach out to [facility] liaisons later I in the week or with new concerns.  Thank you for the opportunity to participate in this patient's plan of care.  Domenic Moras, BSN, RN [place] (in [place]) [phone] (24h on call)

## 2019-06-30 NOTE — Progress Notes (Signed)
VASCULAR SURGERY:  This patient is well-known to me.  He has worsening wounds on his right foot with multilevel arterial occlusive disease.  He would be at very high risk for any surgery given his age and multiple medical comorbidities including severe COPD, coronary artery disease, and congestive heart failure.  The family has consider palliative care.   I have explained that there are 3 options: 1.  Primary right above-the-knee amputation: I think this would be the safest approach, although even this would be associated with with significant risk given his medical comorbidities.  I think he has an 80% chance of healing and above-the-knee amputation despite his common femoral artery occlusion.  2.  Right femoral endarterectomy and profundoplasty and right femoral to below-knee popliteal artery bypass: I think an aggressive attempt at revascularization would be associated with very high risk and only a probable 30% chance of being successful in terms of limb salvage 3.  Palliative care  I have discussed these options extensively with with the patient, his wife and daughters, and his son who is a Engineer, drilling.  Currently the patient does not wish to proceed with any surgery.  He feels strongly against having an amputation.  I think the family agrees that he would be very high risk for an aggressive approach to revascularization.  I have consulted cardiology has seen the cardiac evaluation before any procedure.  I think it is very unlikely that he will be agreeable to proceed with surgery tomorrow, however I will make him n.p.o. and talk to him first thing in the morning in case we make any progress on convincing him of the need for surgery.  Deitra Mayo, MD Office: [phone]

## 2019-06-30 NOTE — Progress Notes (Signed)
  Progress Note    06/30/2019 7:28 AM * No surgery date entered *  Subjective: Having chest pain this morning  Vitals:   06/29/19 1749 06/30/19 0551  BP: 115/71 (!) 145/82  Pulse: 89 91  Resp: 18 16  Temp: 98.1 F (36.7 C) (!) 97.5 F (36.4 C)  SpO2: 98% 97%    Physical Exam: Awake alert oriented No palpable comfortable pulse Strongly palpable left brachial pulse Right foot dressing clean dry intact  CBC    Component Value Date/Time   WBC 9.7 06/28/2019 0231   RBC 3.48 (L) 06/28/2019 0231   HGB 11.0 (L) 06/28/2019 0231   HGB 12.4 (L) 06/25/2019 1449   HCT 34.7 (L) 06/28/2019 0231   HCT 36.6 (L) 06/25/2019 1449   PLT 182 06/28/2019 0231   PLT 208 06/25/2019 1449   MCV 99.7 06/28/2019 0231   MCV 96 06/25/2019 1449   MCH 31.6 06/28/2019 0231   MCHC 31.7 06/28/2019 0231   RDW 12.4 06/28/2019 0231   RDW 12.2 06/25/2019 1449   LYMPHSABS 0.7 06/27/2019 1422   LYMPHSABS 0.4 (L) 06/25/2019 1449   MONOABS 1.1 (H) 06/27/2019 1422   EOSABS 0.1 06/27/2019 1422   EOSABS 0.0 06/25/2019 1449   BASOSABS 0.1 06/27/2019 1422   BASOSABS 0.1 06/25/2019 1449    BMET    Component Value Date/Time   NA 139 06/28/2019 0231   NA 139 06/25/2019 1449   K 4.1 06/28/2019 0231   CL 97 (L) 06/28/2019 0231   CO2 33 (H) 06/28/2019 0231   GLUCOSE 85 06/28/2019 0231   BUN 23 06/28/2019 0231   BUN 29 (H) 06/25/2019 1449   CREATININE 1.14 06/28/2019 0231   CALCIUM 9.2 06/28/2019 0231   GFRNONAA 58 (L) 06/28/2019 0231   GFRAA >60 06/28/2019 0231    INR    Component Value Date/Time   INR 1.0 06/27/2019 1422     Intake/Output Summary (Last 24 hours) at 06/30/2019 0728 Last data filed at 06/30/2019 8270 Gross per 24 hour  Intake --  Output 550 ml  Net -550 ml     Assessment/plan:  86 y.o. male is here with right foot wound ABIs are 0 on the right.  Chest pain this morning with normal EKG appears to be very anxious.  I discussed with him canceling the procedure he wants to  proceed we will do this along with his chest pain does not worsen he remains hemodynamically stable.  We have reiterated the details of the procedure as well as the risk and benefits and he demonstrates good understanding we will plan for aortogram from a left brachial approach.   Caren Garske C. Donzetta Matters, MD [facility] Office: [phone] Pager: [phone]  06/30/2019 7:28 AM

## 2019-06-30 NOTE — Op Note (Signed)
    Patient name: [name] MRN: [id number] DOB: 1929-12-11 Sex: male  06/30/2019 Pre-operative Diagnosis: Critical right lower extremity ischemia with wounds Post-operative diagnosis:  Same Surgeon:  Erlene Quan C. Donzetta Matters, MD Procedure Performed: 1.  Ultrasound-guided cannulation left brachial artery 2.  Aortogram with bilateral lower extremity runoff 3.  Moderate sedation with fentanyl and Versed for 46 minutes  Indications: 86 year old male with right foot ulceration and has ABIs of 0.  By previous CT has evidence of severe aortoiliac occlusive disease and occluded common femoral and severe SFA disease.  He is indicated for angiography to determine possible surgical options.  Findings: Aorta was healthy down to the level of the renal arteries below which there is severe calcification.  He has severe iliac calcification bilaterally but actually does not appear to have a significant flow-limiting stenosis.  The right common femoral artery is occluded with heavy calcification.  He does reconstitute a profunda femoris after several centimeters.  He also reconstitutes the below-knee popliteal artery and appears to have posterior tibial peroneal runoff to the ankle although there is heavy calcification of the popliteal below the knee.  On the left side become femoral artery is heavily diseased the SFA is flush occluded there is runoff in the profunda.  Similarly has disease throughout his SFA is likely occluded and reconstitutes the popliteal artery with runoff via the peroneal and posterior tibial artery.  I will discussed the outcomes of today's procedure with the family and determined the best plan moving forward for this patient.   Procedure:  The patient was identified in the holding area and taken to room 8.  The patient was then placed supine on the table and prepped and draped in the usual sterile fashion.  A time out was called.  Ultrasound was used to evaluate the left brachial artery.  I did  identify the nerve and the area was anesthetized 1% lidocaine.  Moderate sedation was administered his vital signs were monitored throughout the case.  We cannulated the brachial artery did appear to miss the median nerve with a micropuncture needle followed the wire and sheath.  I placed a versa core wire followed by 5 [place] sheath.  I used a Berenstein catheter to select the descending thoracic aorta.  I placed a wire into the infrarenal aorta and then placed a pigtail catheter.  Aortogram was performed followed by lower extremity runoff with the above findings.  Satisfied we removed the catheter over wire and the wire was removed.  Sheath will be pulled in postoperative holding.  He tolerated procedure well any complication.  Contrast: 117cc  Nikolai Wilczak C. Donzetta Matters, MD [facility] Office: [phone] Pager: [phone]

## 2019-06-30 NOTE — Progress Notes (Signed)
PT Cancellation Note  Patient Details Name: CHRISTIE COPLEY MRN: [id number] DOB: 03-24-1929   Cancelled Treatment:    Reason Eval/Treat Not Completed: Patient at procedure or test/unavailable   Off the floor in Cath Lab for aortogram;   Roney Marion, [place] Pager [phone] Office [phone]    Colletta [place] 06/30/2019, 8:27 AM

## 2019-06-30 NOTE — Progress Notes (Signed)
Patient to cath lab.

## 2019-06-30 NOTE — Progress Notes (Signed)
Pt presented to cath lab holding area with complaints of chest pain on a scale of 4 out of 10.  EKG completed and Dr Donzetta Matters in to see pt.  Decision was to continue with PV procedure.

## 2019-06-30 NOTE — Consult Note (Signed)
Cardiology Consultation:   Patient ID: [name] MRN: [id number]; DOB: 08/13/1929  Admit date: 06/27/2019 Date of Consult: 06/30/2019  Primary Care Provider: [name], MD [facility] Cardiologist: Sinclair Grooms, MD  [place] Electrophysiologist:  None    Patient Profile:   [name] is a 86 y.o. male with a history of CAD s/p CABG in 10/2001, ischemic cardiomyopathy with EF of > 55% in 09/2018, paroxysmal atrial fibrillation not on anticoagulation, bicuspid aortic valve, severe COPD with cor pulmonale on home O2, PAD with total occlusion of right SFA and carotid artery disease, hypertension, hyperlipidemia,  who is being seen today for pre-op evaluation at the request of Dr. Nevada Crane.  History of Present Illness:   [name] is a 86 year old male with the above history who is followed by Dr. Tamala Julian. Patient has complex medical history and most recently has had significant problems with PAD. Patient last seen by Dr. Tamala Julian in 04/2019 for lower extremity pain and was noted to have ischemic ulcer on right great toe. Unfortunately, he was felt to have no interventional approaches given severe COPD and ischemic cardiomyopathy. He was recently seen by [name], NP, on 06/25/2019 for evaluation of lower extremity edema with significant weeping. He recently had debridement of new ulcer at [facility]. Patient also reported fatigue and weakness. There was not felt to be any great treatment options but Lasix was increased. Prognosis felt to be very poor.  Patient presented to the ED on 06/27/2019 for right wound infection. He was started on IV Ceftriazone for cellulitis and gentle fluids for AKI. Repeat ABIs showed critical limb ischemia of right lower extremity. Dopplers negative for DVT. Vascular Surgery was consulted and patient had angiogram today which showed severe iliac calcification bilaterally but no significant flow-liming stenosis and occluded right common femoral artery  with heavy calcification. He does reconstitute a profunda femoris after several centimeters.  He also reconstitutes the below-knee popliteal artery and appears to have posterior tibial peroneal runoff to the ankle although there is heavy calcification of the popliteal below the knee. Also has disease on the left side. Dr. Scot Dock had long discussion with patient about his options. Felt to have 3 treatment options: 1) primary right above knee amputation, 2) right femoral endarterectomy and profundoplasty and right femoral to below-knee popliteal artery bypass, 3) palliative care. Primary amputation felt to be safest approach; however, currently patient does not wish to proceed with surgery. Cardiology consulted for pre-op evaluation in case patient agrees with surgery.   Patient very discouraged that everyone is telling him he likely needs to have his leg amputated. He does not want to do this. From a cardiac standpoint, it is somewhat hard to assess his symptoms given COPD and deconditioning. He does report some diffuse chest tightness earlier today when he went down for angiogram. He thinks that this was due to anxiety but sounds like he has had other symptoms like this before. No radiation to neck or arm. No associated worsening shortness of breath. Patient has chronic dyspnea due to COPD. He is on 2L of O2 at home. He thinks his breathing may have gotten a little worse but again he thinks this may be partly due to anxiety. Does not sound like he has had significant orthopnea recently. He has had worsening lower extremity edema recently but it actually has improved while here in the hospital. No palpitations that he is aware of but he has been told that his  pulse is irregular. He has some lightheadedness/dizziness but he attributes this to his chronic sinusitis. He also has history of orthostatic hypotension. No syncope. No abnormal bleeding.  Patient able to complete all activities of daily living but very  slowly. He states it takes him 2 hours to get dressed in the morning because he gets so fatigued. This sounds new. He states that before he developed this right foot ulcer about 8-9 weeks ago he was able to walk around the house, walk short distances, and go up the stairs in his house. He denies having any chest pain with these activities and only had shortness of breath if he moved too quickly. Now he cannot do much.   Past Medical History:  Diagnosis Date  . Arthritis    "generalized" (04/04/2017)  . Asthma   . Basal cell carcinoma    "left ear"  . BPH (benign prostatic hyperplasia)    severe; s/p multiple biopsies  . CAD (coronary artery disease)   . Carotid artery occlusion   . Chronic respiratory failure ([place])   . Chronic rhinitis   . COPD (chronic obstructive pulmonary disease) (HCC)    2L [place] O2  . Diastolic heart failure ([place]) 2019  . Dilation of biliary tract   . Elevated troponin 03/09/2017  . Gallstones   . GERD (gastroesophageal reflux disease)   . History of blood transfusion    "w/his CABG" (04/04/2017)  . History of kidney stones   . Hyperlipidemia   . Hypertension   . On home oxygen therapy    "~ 24/7" (04/04/2017)  . Peripheral vascular disease ([place])   . Pneumonia 2019  . Squamous cell carcinoma of skin 04/23/2013   in situ-Right flank (txpbx)  . Squamous cell carcinoma of skin 01/23/2017   [place]/ in siut- Left ear rim (CX3FU)  . Syncope and collapse   . Ureteral tumor 08/2015   had endoscopic procedure for evaluation, unable to reach for biopsy    Past Surgical History:  Procedure Laterality Date  . BASAL CELL CARCINOMA EXCISION Left    ear  . CARDIAC CATHETERIZATION     "prior to bypass"  . CATARACT EXTRACTION W/ INTRAOCULAR LENS  IMPLANT, BILATERAL Bilateral   . CORONARY ARTERY BYPASS GRAFT  10/2001   LIMA to LAD, SVG to OM1-2, SVG to RCA and PDA  . LITHOTRIPSY    . PROSTATE BIOPSY  Oct. 2014  . TONSILLECTOMY       Home Medications:  Prior to  Admission medications   Medication Sig Start Date End Date Taking? Authorizing Provider  ALPRAZolam (XANAX) 0.25 MG tablet TAKE 1 TABLET BY MOUTH THREE TIMES DAILY AS NEEDED Patient taking differently: Take 0.125-0.25 mg by mouth 3 (three) times daily as needed for anxiety.  04/21/19  Yes Tanda Rockers, MD  aspirin (BAYER ASPIRIN) 325 MG tablet Take 325 mg by mouth at bedtime.    Yes [provider]  atorvastatin (LIPITOR) 20 MG tablet TAKE 1 TABLET BY MOUTH EVERY DAY Patient taking differently: Take 20 mg by mouth at bedtime.  05/24/16  Yes Belva Crome, MD  Cholecalciferol (VITAMIN D3) 2000 units TABS Take 2,000 Units by mouth daily with lunch.    Yes [provider]  famotidine (PEPCID) 20 MG tablet Take 20 mg by mouth at bedtime.    Yes [provider]  finasteride (PROSCAR) 5 MG tablet Take 5 mg by mouth daily with lunch.    Yes [provider]  fluticasone (FLONASE) 50  MCG/ACT nasal spray Place 2 sprays into both nostrils daily.   Yes [provider]  furosemide (LASIX) 20 MG tablet Take 1 tablet (20 mg total) by mouth 2 (two) times daily. 06/25/19  Yes Burtis Junes, NP  levalbuterol [facility] HFA) 45 MCG/ACT inhaler Inhale 2 puffs into the lungs every 4 (four) hours as needed for wheezing or shortness of breath. 04/14/19  Yes Tanda Rockers, MD  levalbuterol (XOPENEX) 0.63 MG/3ML nebulizer solution USE 1 VIAL VIA NEBULIZER EVERY 4 HOURS AS NEEDED FOR WHEEZING OR SHORTNESS OF BREATH Patient taking differently: Take 0.63 mg by nebulization every 4 (four) hours as needed for wheezing or shortness of breath.  05/10/18  Yes Tanda Rockers, MD  Multiple Vitamins-Minerals (PRESERVISION AREDS 2) CAPS Take 1 capsule by mouth daily.   Yes [provider]  NITROSTAT 0.4 MG SL tablet Place 0.4 mg under the tongue every 5 (five) minutes as needed for chest pain.   Yes [provider]  NONFORMULARY OR COMPOUNDED ITEM Apply 1 application  topically See admin instructions. Triamcinolone/Cetaphil compounded cream- Apply to both legs once a day   Yes [provider]  OXYGEN Inhale 2 L/min into the lungs continuous.    Yes [provider]  predniSONE (DELTASONE) 10 MG tablet FOR FLARE OF COUGH/WHEEZING MAY INCREASE TO 2 DAILY BETTER, THEN 1 DAILY Patient taking differently: Take 10-20 mg by mouth in the morning. Take 10 mg by mouth in the morning and may increase to 20 mg as needed for coughing/wheezing (then decrease back to 10 mg once daily) 02/25/19  Yes Tanda Rockers, MD  PRESCRIPTION MEDICATION See admin instructions. Philips Trilogy ventilator- At bedtime   Yes [provider]  sodium chloride (MURO 128) 5 % ophthalmic solution Place 1 drop into both eyes 4 (four) times daily.   Yes [provider]  spironolactone (ALDACTONE) 25 MG tablet TAKE 1/2 TABLET BY MOUTH EVERY DAY Patient taking differently: Take 12.5 mg by mouth daily.  07/30/18  Yes Belva Crome, MD  SYMBICORT 160-4.5 MCG/ACT inhaler Inhale 2 puffs into the lungs 2 (two) times daily. 06/06/19  Yes Tanda Rockers, MD  TOPROL XL 25 MG 24 hr tablet Take 25 mg by mouth daily.   Yes [provider]  vitamin B-12 (CYANOCOBALAMIN) 1000 MCG tablet Take 1,000 mcg by mouth daily.    Yes [provider]  aspirin 81 MG tablet Take 81 mg by mouth daily.    [provider]  betamethasone dipropionate 0.05 % cream Apply topically 2 (two) times daily. Apply to both feet twice daily for 4 weeks. Patient not taking: Reported on 06/27/2019 01/13/19   Marzetta Board, DPM  metoprolol succinate (TOPROL-XL) 25 MG 24 hr tablet Take 1 tablet (25 mg total) by mouth daily. Patient not taking: Reported on 06/27/2019 10/22/18   Burtis Junes, NP  mometasone (NASONEX) 50 MCG/ACT nasal spray Place 2 sprays into the nose daily. Patient not taking: Reported on 06/27/2019 04/22/19   Tanda Rockers, MD  nitroGLYCERIN (NITROSTAT) 0.4 MG  SL tablet Place 1 tablet (0.4 mg total) under the tongue every 5 (five) minutes as needed for chest pain. Patient not taking: Reported on 06/27/2019 06/05/19   Belva Crome, MD  silodosin (RAPAFLO) 4 MG CAPS capsule Take 4 mg by mouth daily. 06/17/19   [provider]  Tiotropium Bromide Monohydrate (SPIRIVA RESPIMAT) 2.5 MCG/ACT AERS INHALE 2 U[place] DAY Patient taking differently: Inhale 2  puffs into the lungs daily.  05/14/19   Tanda Rockers, MD    Inpatient Medications: Scheduled Meds: . aspirin EC  81 mg Oral Daily  . atorvastatin  20 mg Oral Daily  . famotidine  20 mg Oral Daily  . finasteride  5 mg Oral q morning - 10a  . heparin  5,000 Units Subcutaneous Q8H  . [START ON 07/01/2019] metoprolol succinate  12.5 mg Oral Daily  . mometasone-formoterol  2 puff Inhalation BID  . predniSONE  10 mg Oral Q breakfast  . sodium chloride flush  3 mL Intravenous Q12H  . umeclidinium bromide  1 puff Inhalation Daily   Continuous Infusions: . sodium chloride    . sodium chloride 100 mL/hr at 06/30/19 1523  . cefTRIAXone (ROCEPHIN)  IV 1 g (06/29/19 2119)   PRN Meds: sodium chloride, acetaminophen, ALPRAZolam, hydrALAZINE, labetalol, levalbuterol, ondansetron **OR** ondansetron (ZOFRAN) IV, sodium chloride flush  Allergies:    Allergies  Allergen Reactions  . Cefdinir Diarrhea and Other (See Comments)    Severe Diarrhea  . Nitrofurantoin Swelling and Other (See Comments)    Hand Swelling  . Sulfa Antibiotics Anaphylaxis and Swelling  . Sulfonamide Derivatives Swelling and Other (See Comments)    Facial/tongue swelling  . Tape Other (See Comments)    SKIN IS VERY THIN AND TEARS EASILY!!!!! Please do NOT use "plastic" tape- USE PAPER!!  . Ciprofloxacin Itching, Rash and Other (See Comments)    Red itchy hands  . Nitrofurantoin Swelling and Other (See Comments)    Swollen hands  . Amoxicillin Er Other (See Comments)    Frequest urination  . Doxycycline Other (See  Comments)    "Felt terrible"  . Levaquin [Levofloxacin] Itching and Rash  . Sertraline Anxiety and Other (See Comments)    Makes the patient jittery    Social History:   Social History   Socioeconomic History  . Marital status: Married    Spouse name: Not on file  . Number of children: 2  . Years of education: Not on file  . Highest education level: Not on file  Occupational History  . 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 Smoking status: Former Smoker    Packs/day: 2.00    Years: 33.00    Pack years: 66.00    Types: Cigarettes    Quit date: 03/25/1982    Years since quitting: 37.2  . Smokeless tobacco: Never Used  Vaping Use  . Vaping Use: Never used  Substance and Sexual Activity  . Alcohol use: Yes    Alcohol/week: 0.0 standard drinks    Comment: 1.5-3 oz daily in the past, minimal use in the last few months  . Drug use: No  . Sexual activity: Not on file  Other Topics Concern  . Not on file  Social History Narrative   ** Merged History Encounter **       Pt lives at home with his spouse.         Social Determinants of Health   Financial Resource Strain:   . Difficulty of Paying Living Expenses:   Food Insecurity: No Food Insecurity  . Worried About Charity fundraiser in the Last Year: Never true  . Ran Out of Food in the Last Year: Never true  Transportation Needs: No Transportation Needs  . Lack of Transportation (Medical): No  . Lack of Transportation (Non-Medical): No  Physical Activity:   . Days of Exercise per Week:   .  Minutes of Exercise per Session:   Stress:   . Feeling of Stress :   Social Connections:   . Frequency of Communication with Friends and Family:   . Frequency of Social Gatherings with Friends and Family:   . Attends Religious Services:   . Active Member of Clubs or Organizations:   . Attends Archivist Meetings:   Marland Kitchen Marital Status:   Intimate Partner Violence:   .  Fear of Current or Ex-Partner:   . Emotionally Abused:   Marland Kitchen Physically Abused:   . Sexually Abused:     Family History:    Family History  Problem Relation Age of Onset  . Heart disease Mother   . Heart disease Father        Before age 76  . Breast cancer Mother   . Cancer Mother        Breast cancer  . Hypertension Mother   . Other Mother        AAA  and   Amputation  . Heart attack Father   . Stroke Brother        x3, still living   . Peripheral vascular disease Brother   . Heart disease Brother   . Hyperlipidemia Brother   . Hypertension Brother   . Heart disease Brother   . Allergies Brother      ROS:  Please see the history of present illness.  All other ROS reviewed and negative.     Physical Exam/Data:   Vitals:   06/30/19 0918 06/30/19 1059 06/30/19 1211 06/30/19 1414  BP: 127/68 128/72 (!) 92/50 (!) 103/54  Pulse: 85 60 95 91  Resp:  15 16   Temp:   (!) 97.5 F (36.4 C)   TempSrc:   Oral   SpO2:   98% 98%  Weight:      Height:        Intake/Output Summary (Last 24 hours) at 06/30/2019 1606 Last data filed at 06/30/2019 0625 Gross per 24 hour  Intake -  Output 550 ml  Net -550 ml   Last 3 Weights 06/28/2019 06/25/2019 05/19/2019  Weight (lbs) 138 lb 10.7 oz 138 lb 138 lb  Weight (kg) 62.9 kg 62.596 kg 62.596 kg     Body mass index is 20.48 kg/m.  General: 86 y.o. male resting comfortably in no acute distress. HEENT: Normocephalic and atraumatic. Sclera clear.  Neck: Supple. No carotid bruits. No JVD. Heart: Irregular rhythm with normal rate. Distinct S1 and S2. No murmurs, gallops, or rubs. Radial pulses 2+ and equal bilaterally. Lungs: No increased work of breathing. Clear to ausculation bilaterally. No wheezes, rhonchi, or rales.  Abdomen: Soft, non-distended, and non-tender to palpation.  MSK: Normal strength and tone for age. Extremities: Bilateral lower extremity edema (left > right). Erythema of right lower leg. Right foot currently wrapped  in gauze.  Skin: Warm and dry. Neuro: Alert and oriented x3. No focal deficits. Psych: Normal affect. Responds appropriately.  EKG:  The EKG was personally reviewed and demonstrates: Normal sinus rhythm, rate 91 bpm, with RBBB. No acute ST/T changes.  Telemetry:  Not currently on telemetry.  Relevant CV Studies:  Carotid Dopplers 11/13/2018: Summary:  - Right Carotid: Velocities in the right ICA are consistent with a 1-39%  stenosis. Non-hemodynamically significant plaque <50% noted in the  CCA. - Left Carotid: Velocities in the left ICA are consistent with a 1-39%  stenosis. Non-hemodynamically significant plaque <50% noted in the  CCA.  - Vertebrals: Bilateral  vertebral arteries demonstrate antegrade flow.  - Subclavians: Normal flow hemodynamics were seen in bilateral subclavian arteries.  _______________  Cardiac Monitor 12/2018:  NSR  Frequent PVC's  NSVT up tp 8 secnonds  Blocked PAC's and conducted PAC's  Poor correlation between c/o dizziness, light headedness, and syncope with atthythmia.   Abnormal study with VT Will get EP opinion. Already on beta blocker.   Laboratory Data:  High Sensitivity Troponin:  No results for input(s): TROPONINIHS in the last 720 hours.   Chemistry Recent Labs  Lab 06/27/19 1422 06/28/19 0231 06/30/19 1314  NA 140 139 139  K 3.6 4.1 3.9  CL 96* 97* 101  CO2 33* 33* 29  GLUCOSE 139* 85 93  BUN 26* 23 17  CREATININE 1.44* 1.14 0.96  CALCIUM 9.5 9.2 8.7*  GFRNONAA 43* 58* >60  GFRAA 50* >60 >60  ANIONGAP 11 9 9     Recent Labs  Lab 06/27/19 1422  PROT 5.9*  ALBUMIN 3.8  AST 22  ALT 18  ALKPHOS 59  BILITOT 0.8   Hematology Recent Labs  Lab 06/27/19 1422 06/28/19 0231 06/30/19 1314  WBC 10.2 9.7 10.1  RBC 3.92* 3.48* 3.58*  HGB 12.6* 11.0* 11.7*  HCT 39.1 34.7* 35.8*  MCV 99.7 99.7 100.0  MCH 32.1 31.6 32.7  MCHC 32.2 31.7 32.7  RDW 12.5 12.4 12.5  PLT 206 182 193   BNPNo results for input(s): BNP,  PROBNP in the last 168 hours.  DDimer No results for input(s): DDIMER in the last 168 hours.   Radiology/Studies:  DG Chest 2 View  Result Date: 06/27/2019 CLINICAL DATA:  Possible sepsis.  Nonhealing right foot wounds. EXAM: CHEST - 2 VIEW COMPARISON:  Radiographs 02/11/2019.  CT 03/08/2017. FINDINGS: The heart size and mediastinal contours are stable status post median sternotomy and CABG. The lungs are hyperinflated with chronic biapical scarring and blunting of both costophrenic angles. No edema, confluent airspace opacity, pleural effusion or pneumothorax. The bones appear unchanged. IMPRESSION: Stable postoperative chest with evidence of chronic obstructive pulmonary disease. No acute cardiopulmonary process. Electronically Signed   By: Richardean Sale M.D.   On: 06/27/2019 14:29   DG Ankle Complete Right  Result Date: 06/27/2019 CLINICAL DATA:  Right foot wound infection. EXAM: RIGHT ANKLE - COMPLETE 3+ VIEW COMPARISON:  None. FINDINGS: There is no evidence of fracture, dislocation, or joint effusion. There is no evidence of arthropathy or other focal bone abnormality. Moderate to marked severity diffuse soft tissue swelling is seen. There is mild vascular calcification. IMPRESSION: Moderate to marked severity diffuse soft tissue swelling without acute osseous abnormality. Electronically Signed   By: Virgina Norfolk M.D.   On: 06/27/2019 16:32   PERIPHERAL VASCULAR CATHETERIZATION  Result Date: 06/30/2019 Patient name: [name] MRN: [id number] DOB: 06-04-1929 Sex: male 06/30/2019 Pre-operative Diagnosis: Critical right lower extremity ischemia with wounds Post-operative diagnosis:  Same Surgeon:  Erlene Quan C. Donzetta Matters, MD Procedure Performed: 1.  Ultrasound-guided cannulation left brachial artery 2.  Aortogram with bilateral lower extremity runoff 3.  Moderate sedation with fentanyl and Versed for 46 minutes Indications: 86 year old male with right foot ulceration and has ABIs of 0.  By previous  CT has evidence of severe aortoiliac occlusive disease and occluded common femoral and severe SFA disease.  He is indicated for angiography to determine possible surgical options. Findings: Aorta was healthy down to the level of the renal arteries below which there is severe calcification.  He has severe iliac calcification bilaterally but actually does not  appear to have a significant flow-limiting stenosis.  The right common femoral artery is occluded with heavy calcification.  He does reconstitute a profunda femoris after several centimeters.  He also reconstitutes the below-knee popliteal artery and appears to have posterior tibial peroneal runoff to the ankle although there is heavy calcification of the popliteal below the knee.  On the left side become femoral artery is heavily diseased the SFA is flush occluded there is runoff in the profunda.  Similarly has disease throughout his SFA is likely occluded and reconstitutes the popliteal artery with runoff via the peroneal and posterior tibial artery. I will discussed the outcomes of today's procedure with the family and determined the best plan moving forward for this patient. Procedure:  The patient was identified in the holding area and taken to room 8.  The patient was then placed supine on the table and prepped and draped in the usual sterile fashion.  A time out was called.  Ultrasound was used to evaluate the left brachial artery.  I did identify the nerve and the area was anesthetized 1% lidocaine.  Moderate sedation was administered his vital signs were monitored throughout the case.  We cannulated the brachial artery did appear to miss the median nerve with a micropuncture needle followed the wire and sheath.  I placed a versa core wire followed by 5 [place] sheath.  I used a Berenstein catheter to select the descending thoracic aorta.  I placed a wire into the infrarenal aorta and then placed a pigtail catheter.  Aortogram was performed followed by  lower extremity runoff with the above findings.  Satisfied we removed the catheter over wire and the wire was removed.  Sheath will be pulled in postoperative holding.  He tolerated procedure well any complication. Contrast: 117cc Brandon C. Donzetta Matters, MD [facility] Office: [phone] Pager: [phone]   DG Foot Complete Right  Result Date: 06/27/2019 CLINICAL DATA:  Right foot wound infection. EXAM: RIGHT FOOT COMPLETE - 3+ VIEW COMPARISON:  None. FINDINGS: There is no evidence of fracture or dislocation. There is no evidence of arthropathy or other focal bone abnormality. Marked severity soft tissue swelling is seen involving predominantly the dorsal aspect of the mid and distal right foot. Mild to moderate severity vascular calcification is noted. IMPRESSION: Marked severity soft tissue swelling without evidence of acute osseous abnormality. Electronically Signed   By: Virgina Norfolk M.D.   On: 06/27/2019 16:31   VAS [place] ABI WITH/WO TBI  Result Date: 06/29/2019 LOWER EXTREMITY DOPPLER STUDY Indications: Ulceration, and peripheral artery disease. High Risk Factors: Hypertension, hyperlipidemia, past history of smoking,                    coronary artery disease. Other Factors: COPD on home O2. Carotid artery disease. CHF.  Limitations: Today's exam was limited due to edema. Comparison Study: prior study from 11/13/18 is available for comparison Performing Technologist: Sharion Dove RVS  Examination Guidelines: A complete evaluation includes at minimum, Doppler waveform signals and systolic blood pressure reading at the level of bilateral brachial, anterior tibial, and posterior tibial arteries, when vessel segments are accessible. Bilateral testing is considered an integral part of a complete examination. Photoelectric Plethysmograph (PPG) waveforms and toe systolic pressure readings are included as required and additional duplex testing as needed. Limited examinations  for reoccurring indications may be performed as noted.  ABI Findings: +---------+------------------+-----+---------+--------+ Right    Rt Pressure (mmHg)IndexWaveform Comment  +---------+------------------+-----+---------+--------+ Brachial 128  triphasic         +---------+------------------+-----+---------+--------+ PTA      0                 0.00 absent            +---------+------------------+-----+---------+--------+ DP       0                 0.00 absent            +---------+------------------+-----+---------+--------+ Great Toe0                 0.00 Absent            +---------+------------------+-----+---------+--------+ +---------+------------------+-----+-------------------+-------+ Left     Lt Pressure (mmHg)IndexWaveform           Comment +---------+------------------+-----+-------------------+-------+ Brachial 123                    triphasic                  +---------+------------------+-----+-------------------+-------+ PTA      254               1.98 dampened monophasic        +---------+------------------+-----+-------------------+-------+ DP       96                0.75 dampened monophasic        +---------+------------------+-----+-------------------+-------+ Great Toe26                0.20                            +---------+------------------+-----+-------------------+-------+ +-------+-----------+-----------+------------+------------+ ABI/TBIToday's ABIToday's TBIPrevious ABIPrevious TBI +-------+-----------+-----------+------------+------------+ Right  0          0          1.55        0            +-------+-----------+-----------+------------+------------+ Left   1.98       .20        1.55        0            +-------+-----------+-----------+------------+------------+ Arterial wall calcification precludes accurate ankle pressures and ABIs. Right ABIs appear decreased compared to prior study on  11/2018. Left ABIs appear essentially unchanged compared to prior study on 11/2018.  Summary: Right: Resting right ankle-brachial index indicates critical limb ischemia. The right toe-brachial index is abnormal. Left: Resting left ankle-brachial index indicates noncompressible left lower extremity arteries. The left toe-brachial index is abnormal.  *See table(s) above for measurements and observations.  Electronically signed by Servando Snare MD on 06/29/2019 at 10:55:26 AM.    Final    VAS [place] LOWER EXTREMITY VENOUS (DVT) (ONLY [facility] & [facility] 7a-7p)  Result Date: 06/27/2019  Lower Venous DVTStudy Indications: Edema. Other Indications: Known severe multilevel arterial occlusive disease. Comparison Study: no prior Performing Technologist: Abram Sander RVS  Examination Guidelines: A complete evaluation includes B-mode imaging, spectral Doppler, color Doppler, and power Doppler as needed of all accessible portions of each vessel. Bilateral testing is considered an integral part of a complete examination. Limited examinations for reoccurring indications may be performed as noted. The reflux portion of the exam is performed with the patient in reverse Trendelenburg.  +---------+---------------+---------+-----------+----------+--------------+ RIGHT    CompressibilityPhasicitySpontaneityPropertiesThrombus Aging +---------+---------------+---------+-----------+----------+--------------+ CFV      Full           Yes  Yes                                 +---------+---------------+---------+-----------+----------+--------------+ SFJ      Full                                                        +---------+---------------+---------+-----------+----------+--------------+ FV Prox  Full                                                        +---------+---------------+---------+-----------+----------+--------------+ FV Mid   Full                                                         +---------+---------------+---------+-----------+----------+--------------+ FV DistalFull                                                        +---------+---------------+---------+-----------+----------+--------------+ PFV      Full                                                        +---------+---------------+---------+-----------+----------+--------------+ POP      Full           Yes      Yes                                 +---------+---------------+---------+-----------+----------+--------------+ PTV      Full                                                        +---------+---------------+---------+-----------+----------+--------------+ PERO                                                  Not visualized +---------+---------------+---------+-----------+----------+--------------+   +----+---------------+---------+-----------+----------+--------------+ LEFTCompressibilityPhasicitySpontaneityPropertiesThrombus Aging +----+---------------+---------+-----------+----------+--------------+ CFV Full           Yes      Yes                                 +----+---------------+---------+-----------+----------+--------------+     Summary: RIGHT: - There is no evidence of deep vein thrombosis in the lower extremity.  - No cystic structure found in the popliteal fossa.  LEFT: - No evidence of common femoral vein obstruction.  *See table(s) above for measurements and observations. Electronically signed by Servando Snare MD on 06/27/2019 at 4:30:51 PM.    Final    Assessment and Plan:   Pre-Op Evaluation Patient has significant PAD of right lower extremity and Dr. Scot Dock has recommended primary right above knee amputation vs right femoral endarterectomy and profundoplasty and right femoral to below-knee popliteal artery bypass vs palliative care. Dr. Scot Dock feels amputation would be the safest approach. However, patient does not want to have surgery. We were consulted  for pre-op evaluation in case patient agrees to proceed. Patient does report some diffuse chest tightness this morning which he attributes to anxiety but does not sound like angina. He has chronic shortness of breath due to COPD. He has not been very activity recently since foot ulcer developed 8-9 weeks. However, he states before this is was able to complete >4.0 METS without any angina. Patient is at high risk for surgery given CAD, ischemic cardiomyopathy, and severe COPD. However, do not think that any additional cardiac evaluation will change anything. Will discuss with MD.   CAD s/p CABG - EKG show normal sinus rhythm with RBBB. No acute ST/T changes. - Episode of chest tightness earlier today sounds like it was due to anxiety.  - Continue aspirin, beta-blocker, and statin.  Ischemic Cardiomyopathy - LVEF >55% on Echo in 09/2018. - Lower extremity edema present but lungs clear.  - Home Lasix and Spironolactone held on admission due to AKI. Renal function now back to normal. Would restart Lasix first. - Continue beta-blocker. - Recommend elevating legs as much as possible.  Paroxysmal Atrial Fibrillation - Patient has history of atrial fibrillation listed in office visit notes but not on anticoagulation.  - Recent monitor from 12/2018 showed NSVT, frequent PVCs, blocked PACs, and conducted PACs. - EKG shows normal sinus rhythm with known RBBB.  - Continue beta-blocker.   COPD - No wheezing on exam. - Management per primary team.  AKI - Improved.   For questions or updates, please contact [place] Please consult [email or web address] for contact info under    Signed, Darreld Mclean, PA-C  06/30/2019 4:06 PM

## 2019-06-30 NOTE — Progress Notes (Signed)
PROGRESS NOTE  [name] EHU:[id number] DOB: 04/07/1929 DOA: 06/27/2019 PCP: Travis Cruel, MD  HPI/Recap of past 24 hours: As per H and P done by [name]. [name] a 86 y.o.malewith medical history significant forCOPD/chronic respiratory failure with hypoxia on 2 L supplemental O2 via [place], CAD s/p CABG, chronic diastolic CHF, PVD with right SFA occlusion, PAF not on oral anticoagulation, hypertension, hyperlipidemia, BPH, chronic depression/anxiety, and hearing impairment who presents to the ED for evaluation of right foot wound.  Has had a chronic nonhealing right foot wound related to his severe peripheral vascular disease. He underwent wound debridement on 06/23/2019. Afterwards he has noticed clear drainage from debridement site as well as increased erythema and some desquamation of dorsal foot skin. He has had increased pain at night and with direct stimulation around the wound site.  Denies subjective fevers, chills, diaphoresis. He reports chronic intermittent chest pain and dyspnea related to his COPD which is not significantly changed. He reports a history of difficulty with urine output.  Seen by vascular surgery.  Post ultrasound-guided cannulation of left brachial artery, aortogram with bilateral lower extremity runoff done on 06/30/2019 by vascular surgery [name].  Revealed severe peripheral vascular disease with multilevel arterial occlusive disease.  Per vascular surgery, he would be at very high risk for any surgery given his age and multiple medical comorbidities including severe COPD, coronary artery disease, and congestive heart failure.    06/30/19: Seen and examined at his bedside with his wife present.  Reports pain at the plantar region of his right foot.  Wound specialist has been consulted for local wound care.  We will continue to cover empirically with IV antibiotics.  We will continue IV fluid hydration in the setting of recent IV contrast for  the next 24 hours.    Assessment/Plan: Principal Problem:   Cellulitis of right lower extremity Active Problems:   Hyperlipidemia   Essential hypertension   Chronic respiratory failure with hypoxia and hypercapnia (HCC)   Coronary artery disease involving coronary bypass graft of native heart with angina pectoris (HCC)   Peripheral vascular disease (HCC)   BPH (benign prostatic hyperplasia)   GERD (gastroesophageal reflux disease)   Anxiety   Chronic diastolic CHF (congestive heart failure) (HCC)   COPD (chronic obstructive pulmonary disease) ([place])   AKI (acute kidney injury) ([place])   Cellulitis of left lower extremity   Pressure injury of skin   Severe PVD with total occlusion of right SFA with chronic nonhealing right foot wound and skin changes involving the right lower extremity complicated by cellulitis -Patient is well-known to [name] -Post ultrasound-guided cannulation of left brachial artery, aortogram with bilateral lower extremity runoff done on 06/30/2019 by vascular surgery [name].  Revealed severe peripheral vascular disease with multilevel arterial occlusive disease.  Per vascular surgery, he would be at very high risk for any surgery given his age and multiple medical comorbidities including severe COPD, coronary artery disease, and congestive heart failure. -Wound specialist has been consulted for local wound care.  We will continue to cover empirically with IV antibiotics.  We will continue IV fluid hydration in the setting of recent IV contrast for the next 24 hours.  Right lower extremity cellulitis Continue empiric IV antibiotics Will get MRSA screening Continue pain control as needed Blood cultures negative to date Continue to follow cultures  Improving AKI: Baseline creatinine appears to be 0.9 with GFR greater than 60 Presented with creatinine of  1.44 with GFR 43 Hold off diuretics for now Continue IV fluid hydration x1 day Avoid  nephrotoxins and hypotension Monitor urine output Repeat BMP in the morning  Acute urinary retention in the setting of BPH Unable to void at the time of this visit We will obtain bladder scan Follow urinary retention protocol Continue finasteride Hold off Rapaflo due to soft blood pressures  Hypotension, history of essential hypertension Blood pressure is soft today Hold off antihypertensives; at home he is on Toprol-XL 25 mg daily, spironolactone 25 mg daily and p.o. Lasix 20 mg twice daily Maintain MAP greater than 65 Continue to monitor blood pressure  COPD/chronic hypoxic hypercarbic respiratory failure: -Uses 2 L of home O2 via [place] at baseline -Currently stable. -Continue supplemental oxygen to maintain O2 saturation greater than 90% -Continue home bronchodilators -Patient uses trilogy at homenightly, continue  Chronic diastolic CHF: -Patient has chronic bilateral lower extremity edema -Continue strict I's and O's and daily weight  CAD s/p CABG Hyperlipidemia: Denies any anginal symptoms at the time of this visit. -Continue aspirin and statin  Paroxysmal atrial fibrillation: In sinus rhythm.  Not on anticoagulation.  Rate controlled, Toprol-XL on hold due to soft blood pressures. Continue to monitor   BPH: Continue Proscar. Rapaflo on hold due to soft blood pressures. Continue to monitor urine output  GERD: Stable Continue Pepcid.  Chronic anxiety Continue prior to admission Xanax as needed.    DVT prophylaxis: Subcutaneous heparin 3 times daily Code Status: Partial code Family Communication: Wife and patient's son. Disposition Plan: This will depend on hospital course.   Consultants:   Vascular surgery ([name])  Procedures:  Post ultrasound-guided cannulation of left brachial artery, aortogram with bilateral lower extremity runoff done on 06/30/2019 by vascular surgery [name].  Antimicrobials:   IV  Rocephin      Status is: Inpatient    Dispo: The patient is from: Home.              Anticipated d/c is to: Home with home health services PT OT RN.              Anticipated d/c date is: When vascular surgery signs off.               Patient currently not appropriate for discharge due to ongoing treatment for right lower extremity cellulitis.      Objective: Vitals:   06/30/19 0847 06/30/19 0918 06/30/19 1059 06/30/19 1211  BP: 134/62 127/68 128/72 (!) 92/50  Pulse: 88 85 60 95  Resp:   15 16  Temp:    (!) 97.5 F (36.4 C)  TempSrc:    Oral  SpO2:    98%  Weight:      Height:        Intake/Output Summary (Last 24 hours) at 06/30/2019 1247 Last data filed at 06/30/2019 0347 Gross per 24 hour  Intake --  Output 550 ml  Net -550 ml   Filed Weights   06/28/19 0500  Weight: 62.9 kg    Exam:  . General: 86 y.o. year-old male well developed well nourished in no acute distress.  Alert and oriented x3. . Cardiovascular: Regular rate and rhythm with no rubs or gallops.  No thyromegaly or JVD noted.  Mid chest scar from previous cardiac surgery. Marland Kitchen Respiratory: Clear to auscultation with no wheezes or rales. Good inspiratory effort. . Abdomen: Soft nontender nondistended with normal bowel sounds x4 quadrants. . Musculoskeletal: 2+ pitting edema lower extremity bilaterally.  Unable to palpate dorsalis pedis pulses.  Right lower extremity erythema from ankle down.  Wound present laterally. Marland Kitchen Psychiatry: Mood is appropriate for condition and setting   Data Reviewed: CBC: Recent Labs  Lab 06/25/19 1449 06/27/19 1422 06/28/19 0231  WBC 13.0*  13.2* 10.2 9.7  NEUTROABS 11.5* 8.2*  --   HGB 12.4* 12.6* 11.0*  HCT 36.6* 39.1 34.7*  MCV 96 99.7 99.7  PLT 208 206 569   Basic Metabolic Panel: Recent Labs  Lab 06/25/19 1449 06/27/19 1422 06/28/19 0231  NA 139 140 139  K 3.8 3.6 4.1  CL 95* 96* 97*  CO2 29 33* 33*  GLUCOSE 123* 139* 85  BUN 29* 26* 23   CREATININE 1.28* 1.44* 1.14  CALCIUM 9.8 9.5 9.2   GFR: Estimated Creatinine Clearance: 40.6 mL/min (by C-G formula based on SCr of 1.14 mg/dL). Liver Function Tests: Recent Labs  Lab 06/27/19 1422  AST 22  ALT 18  ALKPHOS 59  BILITOT 0.8  PROT 5.9*  ALBUMIN 3.8   No results for input(s): LIPASE, AMYLASE in the last 168 hours. No results for input(s): AMMONIA in the last 168 hours. Coagulation Profile: Recent Labs  Lab 06/27/19 1422  INR 1.0   Cardiac Enzymes: No results for input(s): CKTOTAL, CKMB, CKMBINDEX, TROPONINI in the last 168 hours. BNP (last 3 results) Recent Labs    07/10/18 1604  PROBNP 648*   HbA1C: No results for input(s): HGBA1C in the last 72 hours. CBG: No results for input(s): GLUCAP in the last 168 hours. Lipid Profile: No results for input(s): CHOL, HDL, LDLCALC, TRIG, CHOLHDL, LDLDIRECT in the last 72 hours. Thyroid Function Tests: No results for input(s): TSH, T4TOTAL, FREET4, T3FREE, THYROIDAB in the last 72 hours. Anemia Panel: No results for input(s): VITAMINB12, FOLATE, FERRITIN, TIBC, IRON, RETICCTPCT in the last 72 hours. Urine analysis:    Component Value Date/Time   COLORURINE YELLOW 06/27/2019 1546   APPEARANCEUR CLEAR 06/27/2019 1546   LABSPEC 1.009 06/27/2019 1546   PHURINE 5.0 06/27/2019 1546   GLUCOSEU NEGATIVE 06/27/2019 1546   HGBUR LARGE (A) 06/27/2019 1546   BILIRUBINUR NEGATIVE 06/27/2019 1546   KETONESUR NEGATIVE 06/27/2019 1546   PROTEINUR NEGATIVE 06/27/2019 1546   NITRITE NEGATIVE 06/27/2019 1546   LEUKOCYTESUR NEGATIVE 06/27/2019 1546   Sepsis Labs: @LABRCNTIP (procalcitonin:4,lacticidven:4)  ) Recent Results (from the past 240 hour(s))  Culture, blood (Routine x 2)     Status: None (Preliminary result)   Collection Time: 06/27/19  1:45 PM   Specimen: BLOOD  Result Value Ref Range Status   Specimen Description BLOOD SITE NOT SPECIFIED  Final   Special Requests   Final    BOTTLES DRAWN AEROBIC AND  ANAEROBIC Blood Culture adequate volume   Culture   Final    NO GROWTH 3 DAYS Performed at [facility], [place] [address], [place], [place] [address]    Report Status PENDING  Incomplete  Culture, blood (Routine x 2)     Status: None (Preliminary result)   Collection Time: 06/27/19  4:56 PM   Specimen: BLOOD  Result Value Ref Range Status   Specimen Description BLOOD BLOOD LEFT FOREARM  Final   Special Requests   Final    BOTTLES DRAWN AEROBIC AND ANAEROBIC Blood Culture results may not be optimal due to an excessive volume of blood received in culture bottles   Culture   Final    NO GROWTH 3 DAYS Performed at [facility], [place],  [place] [address]    Report Status PENDING  Incomplete  SARS Coronavirus 2 by RT PCR (hospital order, performed in [facility] hospital lab) Nasopharyngeal Nasopharyngeal Swab     Status: None   Collection Time: 06/27/19  7:01 PM   Specimen: Nasopharyngeal Swab  Result Value Ref Range Status   SARS Coronavirus 2 NEGATIVE NEGATIVE Final    Comment: (NOTE) SARS-CoV-2 target nucleic acids are NOT DETECTED.  The SARS-CoV-2 RNA is generally detectable in upper and lower respiratory specimens during the acute phase of infection. The lowest concentration of SARS-CoV-2 viral copies this assay can detect is 250 copies / mL. A negative result does not preclude SARS-CoV-2 infection and should not be used as the sole basis for treatment or other patient management decisions.  A negative result may occur with improper specimen collection / handling, submission of specimen other than nasopharyngeal swab, presence of viral mutation(s) within the areas targeted by this assay, and inadequate number of viral copies (<250 copies / mL). A negative result must be combined with clinical observations, patient history, and epidemiological information.  Fact Sheet for Patients:   StrictlyIdeas.no  Fact Sheet for  Healthcare Providers: BankingDealers.co.za  This test is not yet approved or  cleared by the [place] FDA and has been authorized for detection and/or diagnosis of SARS-CoV-2 by FDA under an Emergency Use Authorization (EUA).  This EUA will remain in effect (meaning this test can be used) for the duration of the COVID-19 declaration under Section 564(b)(1) of the Act, 21 U.S.C. section 360bbb-3(b)(1), unless the authorization is terminated or revoked sooner.  Performed at [facility], [place] [address], [place], [place] [address]       Studies: PERIPHERAL VASCULAR CATHETERIZATION  Result Date: 06/30/2019 Patient name: [name] MRN: [id number] DOB: 02/03/1929 Sex: male 06/30/2019 Pre-operative Diagnosis: Critical right lower extremity ischemia with wounds Post-operative diagnosis:  Same Surgeon:  Erlene Quan C. Travis Matters, MD Procedure Performed: 1.  Ultrasound-guided cannulation left brachial artery 2.  Aortogram with bilateral lower extremity runoff 3.  Moderate sedation with fentanyl and Versed for 46 minutes Indications: 86 year old male with right foot ulceration and has ABIs of 0.  By previous CT has evidence of severe aortoiliac occlusive disease and occluded common femoral and severe SFA disease.  He is indicated for angiography to determine possible surgical options. Findings: Aorta was healthy down to the level of the renal arteries below which there is severe calcification.  He has severe iliac calcification bilaterally but actually does not appear to have a significant flow-limiting stenosis.  The right common femoral artery is occluded with heavy calcification.  He does reconstitute a profunda femoris after several centimeters.  He also reconstitutes the below-knee popliteal artery and appears to have posterior tibial peroneal runoff to the ankle although there is heavy calcification of the popliteal below the knee.  On the left side become femoral artery is  heavily diseased the SFA is flush occluded there is runoff in the profunda.  Similarly has disease throughout his SFA is likely occluded and reconstitutes the popliteal artery with runoff via the peroneal and posterior tibial artery. I will discussed the outcomes of today's procedure with the family and determined the best plan moving forward for this patient. Procedure:  The patient was identified in the holding area and taken to room 8.  The patient was then placed supine on the table and prepped and draped in the usual sterile fashion.  A time out was called.  Ultrasound was  used to evaluate the left brachial artery.  I did identify the nerve and the area was anesthetized 1% lidocaine.  Moderate sedation was administered his vital signs were monitored throughout the case.  We cannulated the brachial artery did appear to miss the median nerve with a micropuncture needle followed the wire and sheath.  I placed a versa core wire followed by 5 [place] sheath.  I used a Berenstein catheter to select the descending thoracic aorta.  I placed a wire into the infrarenal aorta and then placed a pigtail catheter.  Aortogram was performed followed by lower extremity runoff with the above findings.  Satisfied we removed the catheter over wire and the wire was removed.  Sheath will be pulled in postoperative holding.  He tolerated procedure well any complication. Contrast: 117cc Brandon C. Travis Matters, MD [facility] Office: [phone] Pager: [phone]    Scheduled Meds: . aspirin EC  81 mg Oral Daily  . atorvastatin  20 mg Oral Daily  . famotidine  20 mg Oral Daily  . finasteride  5 mg Oral q morning - 10a  . heparin  5,000 Units Subcutaneous Q8H  . metoprolol succinate  25 mg Oral Daily  . mometasone-formoterol  2 puff Inhalation BID  . predniSONE  10 mg Oral Q breakfast  . sodium chloride flush  3 mL Intravenous Q12H  . umeclidinium bromide  1 puff Inhalation Daily    Continuous  Infusions: . sodium chloride 100 mL/hr at 06/30/19 0603  . sodium chloride 50 mL/hr at 06/30/19 0854  . sodium chloride    . cefTRIAXone (ROCEPHIN)  IV 1 g (06/29/19 2119)     LOS: 2 days     Kayleen Memos, MD [facility] Pager [phone]  If 7PM-7AM, please contact night-coverage [email or web address] Password [facility] 06/30/2019, 12:47 PM

## 2019-06-30 NOTE — Telephone Encounter (Signed)
Patient is going to the hospital for evaluation of his foot wound .

## 2019-06-30 NOTE — Progress Notes (Signed)
85fr sheath aspirated and removed from left brachial artery. Manual pressure applied for 30 minutes, no s+s of hematoma, tegaderm dressing applied, bedrest instructions given. Radial pulse palpable.  Bedrest begins at 09:35:00

## 2019-07-01 ENCOUNTER — Encounter (HOSPITAL_COMMUNITY): Payer: Self-pay | Admitting: Vascular Surgery

## 2019-07-01 DIAGNOSIS — Z66 Do not resuscitate: Secondary | ICD-10-CM

## 2019-07-01 DIAGNOSIS — Z515 Encounter for palliative care: Secondary | ICD-10-CM

## 2019-07-01 DIAGNOSIS — R39198 Other difficulties with micturition: Secondary | ICD-10-CM

## 2019-07-01 LAB — CBC WITH DIFFERENTIAL/PLATELET
Abs Immature Granulocytes: 0.08 10*3/uL — ABNORMAL HIGH (ref 0.00–0.07)
Basophils Absolute: 0 10*3/uL (ref 0.0–0.1)
Basophils Relative: 1 %
Eosinophils Absolute: 0.1 10*3/uL (ref 0.0–0.5)
Eosinophils Relative: 2 %
HCT: 33.4 % — ABNORMAL LOW (ref 39.0–52.0)
Hemoglobin: 10.9 g/dL — ABNORMAL LOW (ref 13.0–17.0)
Immature Granulocytes: 1 %
Lymphocytes Relative: 8 %
Lymphs Abs: 0.7 10*3/uL (ref 0.7–4.0)
MCH: 32.3 pg (ref 26.0–34.0)
MCHC: 32.6 g/dL (ref 30.0–36.0)
MCV: 99.1 fL (ref 80.0–100.0)
Monocytes Absolute: 1 10*3/uL (ref 0.1–1.0)
Monocytes Relative: 12 %
Neutro Abs: 6.7 10*3/uL (ref 1.7–7.7)
Neutrophils Relative %: 76 %
Platelets: 167 10*3/uL (ref 150–400)
RBC: 3.37 MIL/uL — ABNORMAL LOW (ref 4.22–5.81)
RDW: 12.7 % (ref 11.5–15.5)
WBC: 8.6 10*3/uL (ref 4.0–10.5)
nRBC: 0 % (ref 0.0–0.2)

## 2019-07-01 LAB — BASIC METABOLIC PANEL
Anion gap: 10 (ref 5–15)
BUN: 15 mg/dL (ref 8–23)
CO2: 28 mmol/L (ref 22–32)
Calcium: 8.8 mg/dL — ABNORMAL LOW (ref 8.9–10.3)
Chloride: 101 mmol/L (ref 98–111)
Creatinine, Ser: 1.07 mg/dL (ref 0.61–1.24)
GFR calc Af Amer: >60 mL/min (ref >60)
GFR calc non Af Amer: >60 mL/min (ref >60)
Glucose, Bld: 86 mg/dL (ref 70–99)
Potassium: 4.3 mmol/L (ref 3.5–5.1)
Sodium: 139 mmol/L (ref 135–145)

## 2019-07-01 LAB — MRSA PCR SCREENING: MRSA by PCR: NEGATIVE

## 2019-07-01 MED ORDER — SILODOSIN 4 MG PO CAPS
4.0000 mg | ORAL_CAPSULE | Freq: Every day | ORAL | Status: DC
  Administered 2019-07-02: 4 mg via ORAL
  Filled 2019-07-01 (×4): qty 1

## 2019-07-01 MED ORDER — SACCHAROMYCES BOULARDII 250 MG PO CAPS
250.0000 mg | ORAL_CAPSULE | Freq: Two times a day (BID) | ORAL | Status: DC
  Filled 2019-07-01 (×2): qty 1

## 2019-07-01 MED ORDER — SILODOSIN 4 MG PO CAPS: 4.0000 mg | ORAL_CAPSULE | Freq: Every day | ORAL | Status: DC

## 2019-07-01 NOTE — Consult Note (Signed)
Consultation Note Date: 07/01/2019   Patient Name: [name]  DOB: 1929-02-23  MRN: [id number]  Age / Sex: 86 y.o., male  PCP: [name], MD Referring Physician: Kayleen Memos, DO  Reason for Consultation: Establishing goals of care  HPI/Patient Profile: 86 y.o. male   admitted on 06/27/2019 with past medical history significant for COPD/chronic respiratory failure with hypoxia on 2 L supplemental O2 via [place], CAD s/p CABG, chronic diastolic CHF, PVD with right SFA occlusion, PAF, hypertension, hyperlipidemia, BPH, depression/anxiety, and hard of hearing who presents to the ED for evaluation of right foot wound.  Patient has had a chronic nonhealing right foot wound related to his significant peripheral vascular disease.  He underwent wound debridement on 06/23/2019.    He has had increased pain at night and with direct stimulation around the wound site.    He reports a history of difficulty with urine output,  Wife tells me he was evaluated [facility].  He continues to have urinary frequency with low outputs..  Vascular surgery is involved and per Dr. Mee Hives note options are right AKA (likely the safest option), and aggressive attempts at revascularization which would be associated with significant risk and only a small chance of limb salvage, and palliative care.    He is felt to be high risk for surgery given his coronary artery disease, ischemic cardiomyopathy, and severe COPD.  Safest recommendation per vascular surgery is  primary above-the-knee amputation if he is agreeable.  Patient and family face treatment option decisions, advanced directive decisions and anticipatory care needs.  Clinical Assessment and Goals of Care:  This NP Wadie Lessen reviewed medical records, received report from team, assessed the patient and then meet at the patient's bedside  with his wife and  then spoke by telephone with his daughter/ Alyssa # [phone] to discuss diagnosis, prognosis, GOC, EOL wishes disposition and options.   Concept of Palliative Care was introduced as specialized medical care for people and their families living with serious illness.  If focuses on providing relief from the symptoms and stress of a serious illness.  The goal is to improve quality of life for both the patient and the family.   Education offered  today regarding advanced directives.  Concepts specific to code status, artifical feeding and hydration, continued IV antibiotics and rehospitalization was had.  The difference between a aggressive medical intervention path  and a palliative comfort care path for this patient at this time was had.  Values and goals of care important to patient and family were attempted to be elicited.  Patient faces decision of moving forward with recommendation of amputation versus a shift to a comfort path discharging home with hospice.   MOST form  reintroduced.  Patient had completed MOST forms in the past with outpatient palliative care services/[facility]   Education and conversation regarding concept specific to human mortality and failure to thrive.  Education offered regarding the limitations of medical interventions to prolong quality of life  when a body fails to thrive.  Natural trajectory and expectations at EOL were discussed.  Questions and concerns addressed.  Patient  encouraged to call with questions or concerns.     PMT will continue to support holistically.        Patient and family tell me there is documentation of healthcare power of attorney and advanced directive, they have not presented that to the hospital at this point however wife will bring copy in for scanning.      SUMMARY OF RECOMMENDATIONS    Code Status/Advance Care Planning:  DNR-documented today at patient request.  Wife supports his decision.    Symptom Management:    Education offered today regarding pain management strategies.  Patient has strong beliefs around his desire to not use opioids secondary to his concern of "losing control".  We discussed starting low and going slow and  the ability to manage pain while maintaining cognition.  Palliative Prophylaxis:   Aspiration, Bowel Regimen, Delirium Protocol, Frequent Pain Assessment and Oral Care   Psycho-social/Spiritual:   Desire for further Chaplaincy support:no  Additional Recommendations: Education on Hospice--education offered in hopes of clearing up misunderstandings regarding hospice benefit..   Scheduled with patient and family a follow-up meeting for tomorrow at noon with both myself and the hospice liaison.  Prognosis:   < 6 months-prognosis will be dependent on desire for life prolonging measures and patient's desire for surgical interventions.  Discharge Planning: To Be Determined      Primary Diagnoses: Present on Admission: . Cellulitis of right lower extremity . Hyperlipidemia . Essential hypertension . Peripheral vascular disease ([place]) . BPH (benign prostatic hyperplasia) . GERD (gastroesophageal reflux disease) . Anxiety . Chronic respiratory failure with hypoxia and hypercapnia (HCC) . Coronary artery disease involving coronary bypass graft of native heart with angina pectoris ([place]) . Chronic diastolic CHF (congestive heart failure) ([place]) . COPD (chronic obstructive pulmonary disease) ([place]) . Cellulitis of left lower extremity   I have reviewed the medical record, interviewed the patient and family, and examined the patient. The following aspects are pertinent.  Past Medical History:  Diagnosis Date  . Arthritis    "generalized" (04/04/2017)  . Asthma   . Basal cell carcinoma    "left ear"  . BPH (benign prostatic hyperplasia)    severe; s/p multiple biopsies  . CAD (coronary artery disease)   . Carotid artery occlusion   . Chronic respiratory failure ([place])    . Chronic rhinitis   . COPD (chronic obstructive pulmonary disease) (HCC)    2L [place] O2  . Diastolic heart failure ([place]) 2019  . Dilation of biliary tract   . Elevated troponin 03/09/2017  . Gallstones   . GERD (gastroesophageal reflux disease)   . History of blood transfusion    "w/his CABG" (04/04/2017)  . History of kidney stones   . Hyperlipidemia   . Hypertension   . On home oxygen therapy    "~ 24/7" (04/04/2017)  . Peripheral vascular disease ([place])   . Pneumonia 2019  . Squamous cell carcinoma of skin 04/23/2013   in situ-Right flank (txpbx)  . Squamous cell carcinoma of skin 01/23/2017   [place]/ in siut- Left ear rim (CX3FU)  . Syncope and collapse   . Ureteral tumor 08/2015   had endoscopic procedure for evaluation, unable to reach for biopsy   Social History   Socioeconomic History  . Marital status: Married    Spouse name: Not on file  . Number of children: 2  .  Years of education: Not on file  . Highest education level: Not on file  Occupational History  . 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 Smoking status: Former Smoker    Packs/day: 2.00    Years: 33.00    Pack years: 66.00    Types: Cigarettes    Quit date: 03/25/1982    Years since quitting: 37.2  . Smokeless tobacco: Never Used  Vaping Use  . Vaping Use: Never used  Substance and Sexual Activity  . Alcohol use: Yes    Alcohol/week: 0.0 standard drinks    Comment: 1.5-3 oz daily in the past, minimal use in the last few months  . Drug use: No  . Sexual activity: Not on file  Other Topics Concern  . Not on file  Social History Narrative   ** Merged History Encounter **       Pt lives at home with his spouse.         Social Determinants of Health   Financial Resource Strain:   . Difficulty of Paying Living Expenses:   Food Insecurity: No Food Insecurity  . Worried About Charity fundraiser in the Last Year: Never true  . Ran Out of Food in  the Last Year: Never true  Transportation Needs: No Transportation Needs  . Lack of Transportation (Medical): No  . Lack of Transportation (Non-Medical): No  Physical Activity:   . Days of Exercise per Week:   . Minutes of Exercise per Session:   Stress:   . Feeling of Stress :   Social Connections:   . Frequency of Communication with Friends and Family:   . Frequency of Social Gatherings with Friends and Family:   . Attends Religious Services:   . Active Member of Clubs or Organizations:   . Attends Archivist Meetings:   Marland Kitchen Marital Status:    Family History  Problem Relation Age of Onset  . Heart disease Mother   . Heart disease Father        Before age 29  . Breast cancer Mother   . Cancer Mother        Breast cancer  . Hypertension Mother   . Other Mother        AAA  and   Amputation  . Heart attack Father   . Stroke Brother        x3, still living   . Peripheral vascular disease Brother   . Heart disease Brother   . Hyperlipidemia Brother   . Hypertension Brother   . Heart disease Brother   . Allergies Brother    Scheduled Meds: . aspirin EC  81 mg Oral Daily  . atorvastatin  20 mg Oral Daily  . famotidine  20 mg Oral Daily  . finasteride  5 mg Oral q morning - 10a  . heparin  5,000 Units Subcutaneous Q8H  . metoprolol succinate  12.5 mg Oral Daily  . mometasone-formoterol  2 puff Inhalation BID  . predniSONE  10 mg Oral Q breakfast  . sodium chloride flush  3 mL Intravenous Q12H  . umeclidinium bromide  1 puff Inhalation Daily   Continuous Infusions: . sodium chloride    . sodium chloride 100 mL/hr at 07/01/19 0642  . cefTRIAXone (ROCEPHIN)  IV 1 g (06/30/19 2159)   PRN Meds:.sodium chloride, acetaminophen, ALPRAZolam, hydrALAZINE, labetalol, levalbuterol, ondansetron **OR** ondansetron (ZOFRAN) IV, sodium chloride flush Medications Prior to Admission:  Prior to Admission  medications   Medication Sig Start Date End Date Taking? Authorizing  Provider  ALPRAZolam (XANAX) 0.25 MG tablet TAKE 1 TABLET BY MOUTH THREE TIMES DAILY AS NEEDED Patient taking differently: Take 0.125-0.25 mg by mouth 3 (three) times daily as needed for anxiety.  04/21/19  Yes Tanda Rockers, MD  aspirin (BAYER ASPIRIN) 325 MG tablet Take 325 mg by mouth at bedtime.    Yes [provider]  atorvastatin (LIPITOR) 20 MG tablet TAKE 1 TABLET BY MOUTH EVERY DAY Patient taking differently: Take 20 mg by mouth at bedtime.  05/24/16  Yes Belva Crome, MD  Cholecalciferol (VITAMIN D3) 2000 units TABS Take 2,000 Units by mouth daily with lunch.    Yes [provider]  famotidine (PEPCID) 20 MG tablet Take 20 mg by mouth at bedtime.    Yes [provider]  finasteride (PROSCAR) 5 MG tablet Take 5 mg by mouth daily with lunch.    Yes [provider]  fluticasone (FLONASE) 50 MCG/ACT nasal spray Place 2 sprays into both nostrils daily.   Yes [provider]  furosemide (LASIX) 20 MG tablet Take 1 tablet (20 mg total) by mouth 2 (two) times daily. 06/25/19  Yes Burtis Junes, NP  levalbuterol [facility] HFA) 45 MCG/ACT inhaler Inhale 2 puffs into the lungs every 4 (four) hours as needed for wheezing or shortness of breath. 04/14/19  Yes Tanda Rockers, MD  levalbuterol (XOPENEX) 0.63 MG/3ML nebulizer solution USE 1 VIAL VIA NEBULIZER EVERY 4 HOURS AS NEEDED FOR WHEEZING OR SHORTNESS OF BREATH Patient taking differently: Take 0.63 mg by nebulization every 4 (four) hours as needed for wheezing or shortness of breath.  05/10/18  Yes Tanda Rockers, MD  Multiple Vitamins-Minerals (PRESERVISION AREDS 2) CAPS Take 1 capsule by mouth daily.   Yes [provider]  NITROSTAT 0.4 MG SL tablet Place 0.4 mg under the tongue every 5 (five) minutes as needed for chest pain.   Yes [provider]  NONFORMULARY OR COMPOUNDED ITEM Apply 1 application topically See admin instructions. Triamcinolone/Cetaphil compounded cream- Apply to  both legs once a day   Yes [provider]  OXYGEN Inhale 2 L/min into the lungs continuous.    Yes [provider]  predniSONE (DELTASONE) 10 MG tablet FOR FLARE OF COUGH/WHEEZING MAY INCREASE TO 2 DAILY BETTER, THEN 1 DAILY Patient taking differently: Take 10-20 mg by mouth in the morning. Take 10 mg by mouth in the morning and may increase to 20 mg as needed for coughing/wheezing (then decrease back to 10 mg once daily) 02/25/19  Yes Tanda Rockers, MD  PRESCRIPTION MEDICATION See admin instructions. Philips Trilogy ventilator- At bedtime   Yes [provider]  sodium chloride (MURO 128) 5 % ophthalmic solution Place 1 drop into both eyes 4 (four) times daily.   Yes [provider]  spironolactone (ALDACTONE) 25 MG tablet TAKE 1/2 TABLET BY MOUTH EVERY DAY Patient taking differently: Take 12.5 mg by mouth daily.  07/30/18  Yes Belva Crome, MD  SYMBICORT 160-4.5 MCG/ACT inhaler Inhale 2 puffs into the lungs 2 (two) times daily. 06/06/19  Yes Tanda Rockers, MD  TOPROL XL 25 MG 24 hr tablet Take 25 mg by mouth daily.   Yes [provider]  vitamin B-12 (CYANOCOBALAMIN) 1000 MCG tablet Take 1,000 mcg by mouth daily.    Yes [provider]  aspirin 81 MG tablet Take 81 mg by mouth daily.    [provider]  betamethasone dipropionate 0.05 % cream Apply topically 2 (two) times daily. Apply to both feet twice daily for 4 weeks. Patient not taking: Reported on 06/27/2019 01/13/19   Marzetta Board, DPM  metoprolol succinate (TOPROL-XL) 25 MG 24 hr tablet Take 1 tablet (25 mg total) by mouth daily. Patient not taking: Reported on 06/27/2019 10/22/18   Burtis Junes, NP  mometasone (NASONEX) 50 MCG/ACT nasal spray Place 2 sprays into the nose daily. Patient not taking: Reported on 06/27/2019 04/22/19   Tanda Rockers, MD  nitroGLYCERIN (NITROSTAT) 0.4 MG SL tablet Place 1 tablet (0.4 mg total) under the tongue every 5 (five) minutes as  needed for chest pain. Patient not taking: Reported on 06/27/2019 06/05/19   Belva Crome, MD  silodosin (RAPAFLO) 4 MG CAPS capsule Take 4 mg by mouth daily. 06/17/19   [provider]  Tiotropium Bromide Monohydrate (SPIRIVA RESPIMAT) 2.5 MCG/ACT AERS INHALE 2 PUFFS BY MOUTH EVERY DAY Patient taking differently: Inhale 2 puffs into the lungs daily.  05/14/19   Tanda Rockers, MD   Allergies  Allergen Reactions  . Cefdinir Diarrhea and Other (See Comments)    Severe Diarrhea  . Nitrofurantoin Swelling and Other (See Comments)    Hand Swelling  . Sulfa Antibiotics Anaphylaxis and Swelling  . Sulfonamide Derivatives Swelling and Other (See Comments)    Facial/tongue swelling  . Tape Other (See Comments)    SKIN IS VERY THIN AND TEARS EASILY!!!!! Please do NOT use "plastic" tape- USE PAPER!!  . Ciprofloxacin Itching, Rash and Other (See Comments)    Red itchy hands  . Nitrofurantoin Swelling and Other (See Comments)    Swollen hands  . Amoxicillin Er Other (See Comments)    Frequest urination  . Doxycycline Other (See Comments)    "Felt terrible"  . Levaquin [Levofloxacin] Itching and Rash  . Sertraline Anxiety and Other (See Comments)    Makes the patient jittery   Review of Systems  Neurological: Positive for weakness.    Physical Exam Cardiovascular:     Rate and Rhythm: Normal rate.  Skin:    General: Skin is warm and dry.  Neurological:     Mental Status: He is alert.     Vital Signs: BP 114/75 (BP Location: Left Arm)   Pulse 91   Temp 98 F (36.7 C) (Oral)   Resp 18   Ht 5\' 9"  (1.753 m)   Wt 64 kg   SpO2 99%   BMI 20.84 kg/m  Pain Scale: 0-10   Pain Score: 6    SpO2: SpO2: 99 % O2 Device:SpO2: 99 % O2 Flow Rate: .O2 Flow Rate (L/min): 2 L/min  IO: Intake/output summary:   Intake/Output Summary (Last 24 hours) at 07/01/2019 0849 Last data filed at 07/01/2019 0544 Gross per 24 hour  Intake 854.21 ml  Output 400 ml  Net 454.21 ml    LBM:  Last BM Date: 06/29/19 Baseline Weight: Weight: 62.9 kg Most recent weight: Weight: 64 kg     Palliative Assessment/Data: 40%   Discussed with Dr Nevada Crane and hospice liaison Venia Carbon  Time In: 0800 Time Out: 0930 Time Total: 90 minutes Greater than 50%  of this time was spent counseling and coordinating care related to the above assessment and plan.  Signed by: Wadie Lessen, NP   Please contact Palliative Medicine Team phone at [phone] for questions and concerns.  For individual provider: See Shea Evans

## 2019-07-01 NOTE — TOC Initial Note (Signed)
Transition of Care [facility]) - Initial/Assessment Note    Patient Details  Name: [name] MRN: [id number] Date of Birth: Jan 08, 1930  [facility]) CM/SW Contact:    Carles Collet, RN Phone Number: 07/01/2019, 11:00 AM  Clinical Narrative:        Spoke with patient, spouse and daughter Yetta Flock at bedside. Spouse still undetermined about home health choice, asking if patient would have to go home with hospice services since they don't want the amputation. Alyssa asked to speak to me privately in the hallway while her mother helped her father use the urinal.     Alyssa states they are active with palliative services through [facility] [place] [facility], they currently do not have home health. She lives across the street, her mother provides help with ADLs and this is becoming increasingly difficult. They expressed interest in wound care. Explained level of support with home health and home hospice. Explained that majority of care will be provided by family unless arrangements are made with a private duty agency. Emailed [facility] resources for private duty care, she understands to follow up with them directly if services are wanted by her and her family. Alyssa feels that her parents will not agree to amputation, and that he will go home with [facility] services. I reached out to [facility] palliative and they also met with the patient 6/21 and will continue w palliative care being available to progress to home hospice whenever decided by family.          Expected Discharge Plan: [place] Barriers to Discharge: Continued Medical Work up   Patient Goals and CMS Choice Patient states their goals for this hospitalization and ongoing recovery are:: to get home CMS Medicare.gov Compare Post Acute Care list provided to:: Patient Represenative (must comment) (wife) Choice offered to / list presented to : Spouse  Expected Discharge Plan and Services Expected Discharge Plan: [place]   Discharge Planning Services: CM Consult   Living arrangements for the past 2 months: Single Family Home                                      Prior Living Arrangements/Services Living arrangements for the past 2 months: Single Family Home Lives with:: Spouse Patient language and need for interpreter reviewed:: Yes Do you feel safe going back to the place where you live?: Yes      Need for Family Participation in Patient Care: Yes (Comment) Care giver support system in place?: Yes (comment) Current home services: DME Criminal Activity/Legal Involvement Pertinent to Current Situation/Hospitalization: No - Comment as needed  Activities of Daily Living Home Assistive Devices/Equipment: Cane (specify quad or straight), Blood pressure cuff, Walker (specify type), Wheelchair, Shower chair with back, Scales ADL Screening (condition at time of admission) Patient's cognitive ability adequate to safely complete daily activities?: Yes Is the patient deaf or have difficulty hearing?: Yes Does the patient have difficulty seeing, even when wearing glasses/contacts?: No Does the patient have difficulty concentrating, remembering, or making decisions?: No Patient able to express need for assistance with ADLs?: Yes Does the patient have difficulty dressing or bathing?: Yes Independently performs ADLs?: No Does the patient have difficulty walking or climbing stairs?: Yes Weakness of Legs: Right Weakness of Arms/Hands: None  Permission Sought/Granted                  Emotional Assessment  Appearance:: Appears stated age       Alcohol / Substance Use: Not Applicable Psych Involvement: No (comment)  Admission diagnosis:  Wound infection [T14.8XXA, L08.9] Cellulitis of left lower extremity [L03.116] Cellulitis of right lower extremity [L03.115] Patient Active Problem List   Diagnosis Date Noted  . Pressure injury of skin 06/29/2019  . Cellulitis of left lower  extremity 06/28/2019  . Cellulitis of right lower extremity 06/27/2019  . AKI (acute kidney injury) ([place]) 06/27/2019  . Headache 10/16/2018  . Severe major depression without psychotic features ([place]) 09/22/2017  . Generalized anxiety disorder 09/22/2017  . Preoperative cardiovascular examination 05/10/2017  . Weakness 04/04/2017  . Chest pain 03/24/2017  . AV block, [place] 1 03/24/2017  . CAD (coronary artery disease) 03/24/2017  . Chronic diastolic CHF (congestive heart failure) ([place]) 03/24/2017  . COPD (chronic obstructive pulmonary disease) ([place]) 03/24/2017  . NSTEMI (non-ST elevated myocardial infarction) ([place])   . Elevated troponin 03/09/2017  . Acute on chronic diastolic heart failure ([place])   . Tachycardia 03/08/2017  . Influenza A 02/04/2017  . Cor pulmonale, chronic (HCC)/ clinical dx  09/13/2016  . Orthostatic hypotension 09/26/2015  . Angina at rest [facility]) 09/24/2015  . BPH (benign prostatic hyperplasia) 09/13/2015  . GERD (gastroesophageal reflux disease) 09/13/2015  . Anxiety 09/13/2015  . Sinusitis, chronic 09/09/2015  . DOE (dyspnea on exertion) 04/15/2015  . Acute heart failure ([place]) 03/18/2015  . Right calf pain 06/18/2014  . Coronary artery disease involving coronary bypass graft of native heart with angina pectoris ([place])   . Peripheral vascular disease ([place])   . Essential hypertension   . Obstructive sleep apnea   . Rhinitis, chronic 01/29/2007  . COPD   GOLD IV/ 02/steroid dep  01/29/2007  . Chronic respiratory failure with hypoxia and hypercapnia ([place]) 01/29/2007  . Hyperlipidemia    PCP:  [name], MD Pharmacy:   [facility] [address], [address] [place] [address] Phone: [phone] Fax: [phone]     Social Determinants of Health (SDOH) Interventions    Readmission Risk Interventions No flowsheet data found.

## 2019-07-01 NOTE — Progress Notes (Signed)
Physical Therapy Treatment Patient Details Name: [name] MRN: [id number] DOB: 12-21-1929 Today's Date: 07/01/2019    History of Present Illness Pt is an 24 s/p male secondary to R LE cellulitis. PMH including but not limited to COPD/chronic respiratory failure with hypoxia on 2 L supplemental O2 via [place], CAD s/p CABG, chronic diastolic CHF, PVD with right SFA occlusion, PAF, hypertension, hyperlipidemia, BPH, depression/anxiety, and hard of hearing.     PT Comments    Patient progressing slowly towards PT goals. Reports having a rough night and feeling very tired this morning. Despite this, improved ambulation distance with Min guard assist and use of RW for balance/safety. Declined stair training this session. Discussed the importance of initiating stair training next session as pt cannot get away from stairs at home and this will help problem solve home setup prior to d/c. Pt agreeable. Encouraged OOB to chair for all meals. Will follow.   Follow Up Recommendations  Home health PT;Supervision for mobility/OOB     Equipment Recommendations  None recommended by PT    Recommendations for Other Services       Precautions / Restrictions Precautions Precautions: Fall Restrictions Weight Bearing Restrictions: No    Mobility  Bed Mobility Overal bed mobility: Modified Independent             General bed mobility comments: No assist needed, use of rails.  Transfers Overall transfer level: Needs assistance Equipment used: Rolling walker (2 wheeled) Transfers: Sit to/from Stand Sit to Stand: Min guard         General transfer comment: Min guard for safety. Stood from EOB x1 pulling up on RW.  Ambulation/Gait Ambulation/Gait assistance: Min guard Gait Distance (Feet): 75 Feet Assistive device: Rolling walker (2 wheeled) Gait Pattern/deviations: Step-through pattern;Decreased stride length Gait velocity: decreased   General Gait Details: Slow steady gait with RW;  2/4 DOE. 2 standing rest breaks. Limited due to fatigue and feeling tired today.   Stairs Stairs:  (Deferred stairs today)           Wheelchair Mobility    Modified Rankin (Stroke Patients Only)       Balance Overall balance assessment: Needs assistance Sitting-balance support: No upper extremity supported;Feet supported Sitting balance-Leahy Scale: Good Sitting balance - Comments: able to donn shoes, assist with post op shoe without LOB.   Standing balance support: During functional activity Standing balance-Leahy Scale: Fair                              Cognition Arousal/Alertness: Awake/alert Behavior During Therapy: WFL for tasks assessed/performed Overall Cognitive Status: Within Functional Limits for tasks assessed                                 General Comments: "I am not cognitively impaired you know, I just cannot hear." Reports feeling tired today, rought night.      Exercises      General Comments General comments (skin integrity, edema, etc.): [name] present during session. Spent some time problem solving/discussing home setup since they live in a multi level home.      Pertinent Vitals/Pain Pain Assessment: Faces Faces Pain Scale: Hurts little more Pain Location: "everywhere" Pain Descriptors / Indicators: Sore Pain Intervention(s): Monitored during session;Repositioned;Limited activity within patient's tolerance    Home Living  Prior Function            PT Goals (current goals can now be found in the care plan section) Progress towards PT goals: Progressing toward goals    Frequency    Min 3X/week      PT Plan Current plan remains appropriate    Co-evaluation              AM-PAC PT "6 Clicks" Mobility   Outcome Measure  Help needed turning from your back to your side while in a flat bed without using bedrails?: None Help needed moving from lying on your back to  sitting on the side of a flat bed without using bedrails?: None Help needed moving to and from a bed to a chair (including a wheelchair)?: A Little Help needed standing up from a chair using your arms (e.g., wheelchair or bedside chair)?: A Little Help needed to walk in hospital room?: A Little Help needed climbing 3-5 steps with a railing? : A Little 6 Click Score: 20    End of Session Equipment Utilized During Treatment: Oxygen;Gait belt Activity Tolerance: Patient limited by fatigue Patient left: in bed;with family/visitor present (sitting EOB with lunch arriving) Nurse Communication: Mobility status PT Visit Diagnosis: Other abnormalities of gait and mobility (R26.89);Pain Pain - part of body:  (everywhere)     Time: 1036-1100 PT Time Calculation (min) (ACUTE ONLY): 24 min  Charges:  $Gait Training: 8-22 mins $Therapeutic Activity: 8-22 mins                     Marisa Severin, PT, DPT Acute Rehabilitation Services Pager [phone] Office [place] 07/01/2019, 12:53 PM

## 2019-07-01 NOTE — Progress Notes (Addendum)
PROGRESS NOTE  WADELL CRADDOCK VFI:[id number] DOB: Sep 23, 1929 DOA: 06/27/2019 PCP: [name], MD  HPI/Recap of past 24 hours: As per H and P done by Dr. Cleaster Corin. [name] a 86 y.o.malewith medical history significant forCOPD/chronic respiratory failure with hypoxia on 2 L supplemental O2 via [place], CAD s/p CABG, chronic diastolic CHF, PVD with right SFA occlusion, PAF not on oral anticoagulation, hypertension, hyperlipidemia, BPH, chronic depression/anxiety, and hearing impairment who presents to the ED for evaluation of right foot wound.  Has had a chronic nonhealing right foot wound related to his severe peripheral vascular disease. He underwent wound debridement on 06/23/2019. Afterwards he has noticed clear drainage from debridement site as well as increased erythema and some desquamation of dorsal foot skin. He has had increased pain at night and with direct stimulation around the wound site.  Denies subjective fevers, chills, diaphoresis. He reports chronic intermittent chest pain and dyspnea related to his COPD which is not significantly changed. He reports a history of difficulty with urine output.  Seen by vascular surgery.  Post ultrasound-guided cannulation of left brachial artery, aortogram with bilateral lower extremity runoff done on 06/30/2019 by vascular surgery Dr. Donzetta Matters.  Revealed severe peripheral vascular disease with multilevel arterial occlusive disease.  Per vascular surgery, he would be at very high risk for any surgery given his age and multiple medical comorbidities including severe COPD, coronary artery disease, and congestive heart failure.    07/01/19: Seen this AM. Per vascular surgery patient does not wish to proceed with surgery.  Vascular surgery will arrange for follow-up in 2 to 3 weeks.  Concerns about inability to completely emptying his bladder.  Bladder scan completed.  Restarted his prior to admission  Rapaflo.    Assessment/Plan: Principal Problem:   Cellulitis of right lower extremity Active Problems:   Hyperlipidemia   Essential hypertension   Chronic respiratory failure with hypoxia and hypercapnia (HCC)   Coronary artery disease involving coronary bypass graft of native heart with angina pectoris (HCC)   Peripheral vascular disease (HCC)   BPH (benign prostatic hyperplasia)   GERD (gastroesophageal reflux disease)   Anxiety   Chronic diastolic CHF (congestive heart failure) (HCC)   COPD (chronic obstructive pulmonary disease) ([place])   Preoperative cardiovascular examination   AKI (acute kidney injury) ([place])   Cellulitis of left lower extremity   Pressure injury of skin   Severe PVD with total occlusion of right SFA with chronic nonhealing right foot wound and skin changes involving the right lower extremity complicated by cellulitis -Patient is well-known to Dr. Deitra Mayo -Post ultrasound-guided cannulation of left brachial artery, aortogram with bilateral lower extremity runoff done on 06/30/2019 by vascular surgery Dr. Donzetta Matters.  Revealed severe peripheral vascular disease with multilevel arterial occlusive disease.  Per vascular surgery, he would be at very high risk for any surgery given his age and multiple medical comorbidities including severe COPD, coronary artery disease, and congestive heart failure. -Wound specialist has been consulted for local wound care.  We will continue to cover empirically with IV antibiotics.  We will continue IV fluid hydration in the setting of recent IV contrast for the next 24 hours.  Critical limb ischemia/cellulitis Patient does not wish to proceed with surgery Will follow up with vasc surgery in 2-3 weeks Continue empiric IV antibiotics, rocephin  MRSA screening negative Continue local wound care Continue pain control as needed Blood cultures negative to date Continue to follow cultures  AKI: Baseline creatinine appears  to be 0.9 with GFR greater than 60 Presented with creatinine of 1.44 with GFR 43 Received IV fluid hydration to avoid contrast-induced nephrotoxicity Continue to avoid nephrotoxins and hypotension Monitor urine output Repeat BMP in the morning  BPH Continue finasteride and Rap[place]flo Continue to monitor urine output  Essential hypertension Blood pressure is soft today Decrease dose of Toprol-XL down to 12.5 mg daily Spironolactone on hold Continue to monitor blood pressure  COPD/chronic hypoxic hypercarbic respiratory failure: -Uses 2 L of home O2 via [place] at baseline -Currently stable. -Continue supplemental oxygen to maintain O2 saturation greater than 88-90% -Continue home bronchodilators -Patient uses trilogy at homenightly, continue  Chronic diastolic CHF: -Patient has chronic bilateral lower extremity edema -Continue strict I's and O's and daily weight  CAD s/p CABG Hyperlipidemia: Denies any anginal symptoms at the time of this visit. -Continue aspirin and statin  Paroxysmal atrial fibrillation: In sinus rhythm.  Not on anticoagulation.  Rate controlled on Toprol Continue to monitor   GERD: Stable Continue Pepcid.  Chronic anxiety Continue prior to admission Xanax as needed.  Coccyx, pressure wound stage 1, POA Continue local wound care  Goals of care Palliative Care team has been consulted and following CODE STATUS changed to DNR on 07/01/2019. Meeting with Palliative care team planned for tomorrow 07/02/2019.      DVT prophylaxis: Subcutaneous heparin 3 times daily Code Status: DNR Family Communication: Wife at bedside   Consultants:   Vascular surgery (Dr. Servando Snare)  Cardiology  Palliative medicine team  Procedures:  Post ultrasound-guided cannulation of left brachial artery, aortogram with bilateral lower extremity runoff done on 06/30/2019 by vascular surgery Dr. Donzetta Matters.  Antimicrobials:   IV Rocephin      Status  is: Inpatient    Dispo: The patient is from: Home.              Anticipated d/c is to: Home with home health services PT OT RN.              Anticipated d/c date is: When palliative medicine team signs off.               Patient currently not appropriate for discharge due to ongoing treatment for right lower extremity cellulitis.      Objective: Vitals:   06/30/19 1830 07/01/19 0047 07/01/19 0542 07/01/19 1246  BP: (!) 99/51 137/75 114/75 133/74  Pulse: 84 89 91 96  Resp: 16 17 18 16   Temp: 98.2 F (36.8 C) 98.5 F (36.9 C) 98 F (36.7 C) 98.4 F (36.9 C)  TempSrc: Oral Oral Oral Oral  SpO2: 100% 99% 99% 95%  Weight:   64 kg   Height:        Intake/Output Summary (Last 24 hours) at 07/01/2019 1541 Last data filed at 07/01/2019 0544 Gross per 24 hour  Intake 588.83 ml  Output 400 ml  Net 188.83 ml   Filed Weights   06/28/19 0500 07/01/19 0542  Weight: 62.9 kg 64 kg    Exam:  . General: 86 y.o. year-old male well developed well nourished in no acute distress.  Alert and oriented x3.  . Cardiovascular: Regular rate and rhythm no rubs or gallops.  Mid chest scar from previous cardiac surgery. Marland Kitchen Respiratory: Clear to auscultation no wheezes or rales . Abdomen: Soft nontender nondistended with normal bowel sounds x4 quadrants. . Musculoskeletal: 2+ pitting edema lower extremity bilaterally.  Unable to palpate dorsalis pedis pulses.  Right lower extremity edema and erythema.  Wound present  laterally. Marland Kitchen Psychiatry: Mood is appropriate for condition and setting   Data Reviewed: CBC: Recent Labs  Lab 06/25/19 1449 06/27/19 1422 06/28/19 0231 06/30/19 1314 07/01/19 0604  WBC 13.0*  13.2* 10.2 9.7 10.1 8.6  NEUTROABS 11.5* 8.2*  --  7.8* 6.7  HGB 12.4* 12.6* 11.0* 11.7* 10.9*  HCT 36.6* 39.1 34.7* 35.8* 33.4*  MCV 96 99.7 99.7 100.0 99.1  PLT 208 206 182 193 191   Basic Metabolic Panel: Recent Labs  Lab 06/25/19 1449 06/27/19 1422 06/28/19 0231  06/30/19 1314 07/01/19 0604  NA 139 140 139 139 139  K 3.8 3.6 4.1 3.9 4.3  CL 95* 96* 97* 101 101  CO2 29 33* 33* 29 28  GLUCOSE 123* 139* 85 93 86  BUN 29* 26* 23 17 15   CREATININE 1.28* 1.44* 1.14 0.96 1.07  CALCIUM 9.8 9.5 9.2 8.7* 8.8*  MG  --   --   --  2.0  --   PHOS  --   --   --  2.6  --    GFR: Estimated Creatinine Clearance: 44 mL/min (by C-G formula based on SCr of 1.07 mg/dL). Liver Function Tests: Recent Labs  Lab 06/27/19 1422  AST 22  ALT 18  ALKPHOS 59  BILITOT 0.8  PROT 5.9*  ALBUMIN 3.8   No results for input(s): LIPASE, AMYLASE in the last 168 hours. No results for input(s): AMMONIA in the last 168 hours. Coagulation Profile: Recent Labs  Lab 06/27/19 1422  INR 1.0   Cardiac Enzymes: No results for input(s): CKTOTAL, CKMB, CKMBINDEX, TROPONINI in the last 168 hours. BNP (last 3 results) Recent Labs    07/10/18 1604  PROBNP 648*   HbA1C: No results for input(s): HGBA1C in the last 72 hours. CBG: No results for input(s): GLUCAP in the last 168 hours. Lipid Profile: No results for input(s): CHOL, HDL, LDLCALC, TRIG, CHOLHDL, LDLDIRECT in the last 72 hours. Thyroid Function Tests: No results for input(s): TSH, T4TOTAL, FREET4, T3FREE, THYROIDAB in the last 72 hours. Anemia Panel: No results for input(s): VITAMINB12, FOLATE, FERRITIN, TIBC, IRON, RETICCTPCT in the last 72 hours. Urine analysis:    Component Value Date/Time   COLORURINE YELLOW 06/27/2019 1546   APPEARANCEUR CLEAR 06/27/2019 1546   LABSPEC 1.009 06/27/2019 1546   PHURINE 5.0 06/27/2019 1546   GLUCOSEU NEGATIVE 06/27/2019 1546   HGBUR LARGE (A) 06/27/2019 1546   BILIRUBINUR NEGATIVE 06/27/2019 1546   KETONESUR NEGATIVE 06/27/2019 1546   PROTEINUR NEGATIVE 06/27/2019 1546   NITRITE NEGATIVE 06/27/2019 1546   LEUKOCYTESUR NEGATIVE 06/27/2019 1546   Sepsis Labs: @LABRCNTIP (procalcitonin:4,lacticidven:4)  ) Recent Results (from the past 240 hour(s))  Culture, blood  (Routine x 2)     Status: None (Preliminary result)   Collection Time: 06/27/19  1:45 PM   Specimen: BLOOD  Result Value Ref Range Status   Specimen Description BLOOD SITE NOT SPECIFIED  Final   Special Requests   Final    BOTTLES DRAWN AEROBIC AND ANAEROBIC Blood Culture adequate volume   Culture   Final    NO GROWTH 4 DAYS Performed at [facility], [place] [address], [place], [place] [address]    Report Status PENDING  Incomplete  Culture, blood (Routine x 2)     Status: None (Preliminary result)   Collection Time: 06/27/19  4:56 PM   Specimen: BLOOD  Result Value Ref Range Status   Specimen Description BLOOD BLOOD LEFT FOREARM  Final   Special Requests   Final  BOTTLES DRAWN AEROBIC AND ANAEROBIC Blood Culture results may not be optimal due to an excessive volume of blood received in culture bottles   Culture   Final    NO GROWTH 4 DAYS Performed at [facility] [address] [place], [place] [address]    Report Status PENDING  Incomplete  SARS Coronavirus 2 by RT PCR (hospital order, performed in [facility] hospital lab) Nasopharyngeal Nasopharyngeal Swab     Status: None   Collection Time: 06/27/19  7:01 PM   Specimen: Nasopharyngeal Swab  Result Value Ref Range Status   SARS Coronavirus 2 NEGATIVE NEGATIVE Final    Comment: (NOTE) SARS-CoV-2 target nucleic acids are NOT DETECTED.  The SARS-CoV-2 RNA is generally detectable in upper and lower respiratory specimens during the acute phase of infection. The lowest concentration of SARS-CoV-2 viral copies this assay can detect is 250 copies / mL. A negative result does not preclude SARS-CoV-2 infection and should not be used as the sole basis for treatment or other patient management decisions.  A negative result may occur with improper specimen collection / handling, submission of specimen other than nasopharyngeal swab, presence of viral mutation(s) within the areas targeted by this assay, and inadequate  number of viral copies (<250 copies / mL). A negative result must be combined with clinical observations, patient history, and epidemiological information.  Fact Sheet for Patients:   StrictlyIdeas.no  Fact Sheet for Healthcare Providers: BankingDealers.co.za  This test is not yet approved or  cleared by the [place] FDA and has been authorized for detection and/or diagnosis of SARS-CoV-2 by FDA under an Emergency Use Authorization (EUA).  This EUA will remain in effect (meaning this test can be used) for the duration of the COVID-19 declaration under Section 564(b)(1) of the Act, 21 U.S.C. section 360bbb-3(b)(1), unless the authorization is terminated or revoked sooner.  Performed at [facility], [place] [address], [place], [place] [address]   MRSA PCR Screening     Status: None   Collection Time: 06/30/19  3:47 PM   Specimen: Nasal Mucosa; Nasopharyngeal  Result Value Ref Range Status   MRSA by PCR NEGATIVE NEGATIVE Final    Comment:        The GeneXpert MRSA Assay (FDA approved for NASAL specimens only), is one component of a comprehensive MRSA colonization surveillance program. It is not intended to diagnose MRSA infection nor to guide or monitor treatment for MRSA infections. Performed at [facility], [place] [address] [place],  [address]       Studies: No results found.  Scheduled Meds: . aspirin EC  81 mg Oral Daily  . atorvastatin  20 mg Oral Daily  . famotidine  20 mg Oral Daily  . finasteride  5 mg Oral q morning - 10a  . heparin  5,000 Units Subcutaneous Q8H  . metoprolol succinate  12.5 mg Oral Daily  . mometasone-formoterol  2 puff Inhalation BID  . predniSONE  10 mg Oral Q breakfast  . silodosin  4 mg Oral Q breakfast  . sodium chloride flush  3 mL Intravenous Q12H  . umeclidinium bromide  1 puff Inhalation Daily    Continuous Infusions: . sodium chloride    . cefTRIAXone  (ROCEPHIN)  IV 1 g (06/30/19 2159)     LOS: 3 days     Kayleen Memos, MD [facility] Pager [phone]  If 7PM-7AM, please contact night-coverage [email or web address] Password [facility] 07/01/2019, 3:41 PM

## 2019-07-01 NOTE — Consult Note (Signed)
   [facility] Inpatient Consult   07/01/2019  [name] [birth year] [id number]   Patient is currently active with [facility] for chronic disease management services.  Patient has been engaged by a [facility]. Medical record reveals patient is active in the [facility] Palliative home program, as well.    Plan: Medical record review of progress notes and progress. Follow for progress and disposition needs.  Patient could benefit from a Palliative Care referral.  Touch basis with [facility] RNCM and follow with Inpatient Transition Of Care [TOC] team member to make aware that [facility] following.   Of note, [facility] Care Management services does not replace or interfere with any services that are needed or arranged by inpatient [facility] care management team.  For additional questions or referrals please contact:  Natividad Brood, RN BSN [facility] Liaison  [phone] business mobile phone Toll free office [phone]  Fax number: [phone] [email or web address]

## 2019-07-01 NOTE — Progress Notes (Signed)
Occupational Therapy Treatment Patient Details Name: [name] MRN: [id number] DOB: 11-Jan-1929 Today's Date: 07/01/2019    History of present illness Pt is an 56 s/p male secondary to R LE cellulitis. PMH including but not limited to COPD/chronic respiratory failure with hypoxia on 2 L supplemental O2 via , CAD s/p CABG, chronic diastolic CHF, PVD with right SFA occlusion, PAF, hypertension, hyperlipidemia, BPH, depression/anxiety, and hard of hearing.    OT comments  Wife present during session, both a little frustrated with experience at hospital thus far.  Patient wanting to return home ASAP.  Assured them staff was doing everything they could to make sure he is safe prior to going home.  Stood with RW and min guard assist to complete grooming standing at sink.  Also dressed UB with set up.  Patient and wife thankful at end of session.  Will continue to follow with OT acutely to address the deficits listed below.    Follow Up Recommendations  Supervision - Intermittent;Home health OT    Equipment Recommendations  None recommended by OT    Recommendations for Other Services      Precautions / Restrictions Precautions Precautions: Fall Restrictions Weight Bearing Restrictions: No       Mobility Bed Mobility Overal bed mobility: Modified Independent             General bed mobility comments: No assist needed, use of rails.  Transfers Overall transfer level: Needs assistance Equipment used: Rolling walker (2 wheeled) Transfers: Sit to/from Stand Sit to Stand: Supervision         General transfer comment: Min guard for safety. Stood from EOB x1 pulling up on RW.    Balance Overall balance assessment: Needs assistance Sitting-balance support: No upper extremity supported;Feet supported Sitting balance-Leahy Scale: Good Sitting balance - Comments: able to donn shoes, assist with post op shoe without LOB.   Standing balance support: During functional  activity Standing balance-Leahy Scale: Fair Standing balance comment: Walker used to help off load wieght from foot                           ADL either performed or assessed with clinical judgement   ADL Overall ADL's : Needs assistance/impaired     Grooming: Oral care;Supervision/safety;Standing           Upper Body Dressing : Set up;Sitting   Lower Body Dressing: Set up;Sitting/lateral leans               Functional mobility during ADLs: Supervision/safety       Vision       Perception     Praxis      Cognition Arousal/Alertness: Awake/alert Behavior During Therapy: WFL for tasks assessed/performed Overall Cognitive Status: Within Functional Limits for tasks assessed                                 General Comments: "I am not cognitively impaired you know, I just cannot hear." Reports feeling tired today, rought night.        Exercises     Shoulder Instructions       General Comments [name] present during session. Spent some time problem solving/discussing home setup since they live in a multi level home.    Pertinent Vitals/ Pain       Pain Assessment: 0-10 Pain Score: 2  Faces Pain Scale: Hurts little more  Pain Location: R foot Pain Descriptors / Indicators: Aching Pain Intervention(s): Monitored during session;Repositioned  Home Living                                          Prior Functioning/Environment              Frequency  Min 2X/week        Progress Toward Goals  OT Goals(current goals can now be found in the care plan section)  Progress towards OT goals: Progressing toward goals  Acute Rehab OT Goals Patient Stated Goal: to go home OT Goal Formulation: With patient Time For Goal Achievement: 07/12/19 Potential to Achieve Goals: Good  Plan Discharge plan remains appropriate    Co-evaluation                 AM-PAC OT "6 Clicks" Daily Activity     Outcome  Measure   Help from another person eating meals?: None Help from another person taking care of personal grooming?: A Little Help from another person toileting, which includes using toliet, bedpan, or urinal?: A Little Help from another person bathing (including washing, rinsing, drying)?: A Little Help from another person to put on and taking off regular upper body clothing?: A Little Help from another person to put on and taking off regular lower body clothing?: A Little 6 Click Score: 19    End of Session Equipment Utilized During Treatment: Oxygen;Rolling walker  OT Visit Diagnosis: Pain;Unsteadiness on feet (R26.81) Pain - Right/Left: Right Pain - part of body: Ankle and joints of foot   Activity Tolerance Patient tolerated treatment well   Patient Left in bed;with call bell/phone within reach;with family/visitor present   Nurse Communication Mobility status        Time: 3267-1245 OT Time Calculation (min): 33 min  Charges: OT General Charges $OT Visit: 1 Visit OT Treatments $Self Care/Home Management : 23-37 mins  August Luz, OTR/L    Phylliss Bob 07/01/2019, 3:36 PM

## 2019-07-01 NOTE — Telephone Encounter (Signed)
Patient in hospital and seen by Dr Scot Dock after admission.

## 2019-07-01 NOTE — Progress Notes (Signed)
   VASCULAR SURGERY ASSESSMENT & PLAN:   CRITICAL LIMB ISCHEMIA: This patient has critical limb ischemia of the right lower extremity.  As per my note yesterday the options are right AKA (likely the safest option), and aggressive attempts at revascularization which would be associated with significant risk and only a small chance of limb salvage, and palliative care.  Appreciate cardiology's input.  He is felt to be high risk for surgery given his coronary artery disease, ischemic cardiomyopathy, and severe COPD.  The patient does not wish to proceed with surgery now.  He would like to go home.  I will arrange follow-up in 2 to 3 weeks.  Again I think the safest option would be primary above-the-knee amputation if he is agreeable.   SUBJECTIVE:   Unable to void this morning.  Hurts all over and does not want to do physical therapy.  I did discuss with him the importance of physical therapy given that ultimately his goal regardless of how he proceeds will be to get home safely.  PHYSICAL EXAM:   Vitals:   06/30/19 1414 06/30/19 1830 07/01/19 0047 07/01/19 0542  BP: (!) 103/54 (!) 99/51 137/75 114/75  Pulse: 91 84 89 91  Resp:  16 17 18   Temp:  98.2 F (36.8 C) 98.5 F (36.9 C) 98 F (36.7 C)  TempSrc:  Oral Oral Oral  SpO2: 98% 100% 99% 99%  Weight:    64 kg  Height:       No change in right foot cellulitis.  LABS:   Lab Results  Component Value Date   WBC 8.6 07/01/2019   HGB 10.9 (L) 07/01/2019   HCT 33.4 (L) 07/01/2019   MCV 99.1 07/01/2019   PLT 167 07/01/2019   Lab Results  Component Value Date   CREATININE 1.07 07/01/2019   Lab Results  Component Value Date   INR 1.0 06/27/2019    PROBLEM LIST:    Principal Problem:   Cellulitis of right lower extremity Active Problems:   Hyperlipidemia   Essential hypertension   Chronic respiratory failure with hypoxia and hypercapnia (HCC)   Coronary artery disease involving coronary bypass graft of native heart with  angina pectoris (HCC)   Peripheral vascular disease (HCC)   BPH (benign prostatic hyperplasia)   GERD (gastroesophageal reflux disease)   Anxiety   Chronic diastolic CHF (congestive heart failure) (HCC)   COPD (chronic obstructive pulmonary disease) (HCC)   Preoperative cardiovascular examination   AKI (acute kidney injury) ([place])   Cellulitis of left lower extremity   Pressure injury of skin   CURRENT MEDS:   . aspirin EC  81 mg Oral Daily  . atorvastatin  20 mg Oral Daily  . famotidine  20 mg Oral Daily  . finasteride  5 mg Oral q morning - 10a  . heparin  5,000 Units Subcutaneous Q8H  . metoprolol succinate  12.5 mg Oral Daily  . mometasone-formoterol  2 puff Inhalation BID  . predniSONE  10 mg Oral Q breakfast  . sodium chloride flush  3 mL Intravenous Q12H  . umeclidinium bromide  1 puff Inhalation Daily    Deitra Mayo Office: [phone] 07/01/2019

## 2019-07-02 ENCOUNTER — Inpatient Hospital Stay (HOSPITAL_COMMUNITY): Payer: Medicare Other

## 2019-07-02 DIAGNOSIS — Z66 Do not resuscitate: Secondary | ICD-10-CM

## 2019-07-02 DIAGNOSIS — R531 Weakness: Secondary | ICD-10-CM

## 2019-07-02 DIAGNOSIS — Z515 Encounter for palliative care: Secondary | ICD-10-CM

## 2019-07-02 DIAGNOSIS — I739 Peripheral vascular disease, unspecified: Secondary | ICD-10-CM

## 2019-07-02 DIAGNOSIS — R39198 Other difficulties with micturition: Secondary | ICD-10-CM

## 2019-07-02 DIAGNOSIS — J449 Chronic obstructive pulmonary disease, unspecified: Secondary | ICD-10-CM

## 2019-07-02 LAB — BASIC METABOLIC PANEL
Anion gap: 9 (ref 5–15)
BUN: 15 mg/dL (ref 8–23)
CO2: 28 mmol/L (ref 22–32)
Calcium: 8.7 mg/dL — ABNORMAL LOW (ref 8.9–10.3)
Chloride: 99 mmol/L (ref 98–111)
Creatinine, Ser: 0.93 mg/dL (ref 0.61–1.24)
GFR calc Af Amer: >60 mL/min (ref >60)
GFR calc non Af Amer: >60 mL/min (ref >60)
Glucose, Bld: 81 mg/dL (ref 70–99)
Potassium: 4 mmol/L (ref 3.5–5.1)
Sodium: 136 mmol/L (ref 135–145)

## 2019-07-02 LAB — CBC WITH DIFFERENTIAL/PLATELET
Abs Immature Granulocytes: 0.08 10*3/uL — ABNORMAL HIGH (ref 0.00–0.07)
Basophils Absolute: 0 10*3/uL (ref 0.0–0.1)
Basophils Relative: 0 %
Eosinophils Absolute: 0.1 10*3/uL (ref 0.0–0.5)
Eosinophils Relative: 2 %
HCT: 31.7 % — ABNORMAL LOW (ref 39.0–52.0)
Hemoglobin: 10.4 g/dL — ABNORMAL LOW (ref 13.0–17.0)
Immature Granulocytes: 1 %
Lymphocytes Relative: 6 %
Lymphs Abs: 0.5 10*3/uL — ABNORMAL LOW (ref 0.7–4.0)
MCH: 32.4 pg (ref 26.0–34.0)
MCHC: 32.8 g/dL (ref 30.0–36.0)
MCV: 98.8 fL (ref 80.0–100.0)
Monocytes Absolute: 0.8 10*3/uL (ref 0.1–1.0)
Monocytes Relative: 11 %
Neutro Abs: 5.8 10*3/uL (ref 1.7–7.7)
Neutrophils Relative %: 80 %
Platelets: 179 10*3/uL (ref 150–400)
RBC: 3.21 MIL/uL — ABNORMAL LOW (ref 4.22–5.81)
RDW: 12.8 % (ref 11.5–15.5)
WBC: 7.4 10*3/uL (ref 4.0–10.5)
nRBC: 0 % (ref 0.0–0.2)

## 2019-07-02 LAB — CULTURE, BLOOD (ROUTINE X 2)
Culture: NO GROWTH
Culture: NO GROWTH
Special Requests: ADEQUATE

## 2019-07-02 LAB — CORTISOL: Cortisol, Plasma: 4.5 ug/dL

## 2019-07-02 MED ORDER — CYCLOBENZAPRINE HCL 5 MG PO TABS: 5.0000 mg | ORAL_TABLET | Freq: Three times a day (TID) | ORAL | Status: DC | PRN

## 2019-07-02 MED ORDER — VITAMIN B-12 1000 MCG PO TABS
1000.0000 ug | ORAL_TABLET | Freq: Every day | ORAL | Status: DC
  Administered 2019-07-02: 1000 ug via ORAL
  Filled 2019-07-02: qty 1

## 2019-07-02 MED ORDER — PROSIGHT PO TABS
1.0000 | ORAL_TABLET | Freq: Every day | ORAL | Status: DC
  Administered 2019-07-02: 1 via ORAL
  Filled 2019-07-02: qty 1

## 2019-07-02 MED ORDER — KATE FARMS STANDARD 1.4 PO LIQD
325.0000 mL | Freq: Two times a day (BID) | ORAL | Status: DC
  Administered 2019-07-02: 325 mL via ORAL
  Filled 2019-07-02: qty 325

## 2019-07-02 MED ORDER — SILODOSIN 4 MG PO CAPS: 4.0000 mg | ORAL_CAPSULE | Freq: Every day | ORAL | Status: DC

## 2019-07-02 MED ORDER — ENSURE ENLIVE PO LIQD: 237.0000 mL | Freq: Two times a day (BID) | ORAL | Status: DC

## 2019-07-02 MED ORDER — METOPROLOL SUCCINATE ER 25 MG PO TB24: 12.5000 mg | ORAL_TABLET | Freq: Every day | ORAL | 0 refills | Status: DC

## 2019-07-02 MED ORDER — FUROSEMIDE 20 MG PO TABS
20.0000 mg | ORAL_TABLET | Freq: Two times a day (BID) | ORAL | Status: DC
  Administered 2019-07-02: 20 mg via ORAL
  Filled 2019-07-02 (×2): qty 1

## 2019-07-02 MED ORDER — UMECLIDINIUM BROMIDE 62.5 MCG/INH IN AEPB
1.0000 | INHALATION_SPRAY | Freq: Every day | RESPIRATORY_TRACT | Status: DC
  Filled 2019-07-02: qty 7

## 2019-07-02 MED ORDER — VITAMIN D 25 MCG (1000 UNIT) PO TABS
2000.0000 [IU] | ORAL_TABLET | Freq: Every day | ORAL | Status: DC
  Administered 2019-07-02: 2000 [IU] via ORAL
  Filled 2019-07-02: qty 2

## 2019-07-02 MED ORDER — KATE FARMS STANDARD 1.4 PO LIQD: 325.0000 mL | Freq: Two times a day (BID) | ORAL | 0 refills | Status: AC

## 2019-07-02 MED ORDER — SACCHAROMYCES BOULARDII 250 MG PO CAPS: 250.0000 mg | ORAL_CAPSULE | Freq: Two times a day (BID) | ORAL | 0 refills | Status: AC

## 2019-07-02 MED ORDER — CLINDAMYCIN HCL 300 MG PO CAPS
300.0000 mg | ORAL_CAPSULE | Freq: Four times a day (QID) | ORAL | 0 refills | Status: AC
Start: 2019-07-02 — End: 2019-07-07

## 2019-07-02 NOTE — Discharge Summary (Addendum)
Discharge Summary  [name] HGD:[id number] DOB: 1929/05/28  PCP: [name], MD  Admit date: 06/27/2019 Discharge date: 07/02/2019  Time spent: 35 minutes   Recommendations for Outpatient Follow-up:  1. Follow up with vascular surgery 2. Follow up with wound care 3. Follow up with palliative care medicine  4. Follow up with your PCP 5. Follow up with cardiology 6. Follow up with urology 7. Take your medications as prescribed 8. Continue fall precautions  Discharge Diagnoses:  Active Hospital Problems   Diagnosis Date Noted  . Cellulitis of right lower extremity 06/27/2019  . DNR (do not resuscitate)   . Voiding difficulty   . Palliative care by specialist   . Pressure injury of skin 06/29/2019  . Cellulitis of left lower extremity 06/28/2019  . AKI (acute kidney injury) ([place]) 06/27/2019  . Preoperative cardiovascular examination 05/10/2017  . Chronic diastolic CHF (congestive heart failure) ([place]) 03/24/2017  . COPD (chronic obstructive pulmonary disease) ([place]) 03/24/2017  . BPH (benign prostatic hyperplasia) 09/13/2015  . GERD (gastroesophageal reflux disease) 09/13/2015  . Anxiety 09/13/2015  . Peripheral vascular disease ([place])   . Coronary artery disease involving coronary bypass graft of native heart with angina pectoris ([place])   . Essential hypertension   . Chronic respiratory failure with hypoxia and hypercapnia ([place]) 01/29/2007  . Hyperlipidemia     Resolved Hospital Problems  No resolved problems to display.    Discharge Condition: Guarded   Diet recommendation: Resume previous diet.  Vitals:   07/02/19 1237 07/02/19 1741  BP: 130/71 116/72  Pulse: 96 98  Resp: 16 16  Temp: 98.2 F (36.8 C) 98.3 F (36.8 C)  SpO2: 98% 98%    History of present illness:  As per H and P done by Dr. Cleaster Corin. [name] a 86 y.o.malewith medical history significant forCOPD/chronic respiratory failure with hypoxia on 2 L supplemental O2 via [place],  CAD s/p CABG, chronic diastolic CHF, PVD with right SFA occlusion, PAF not on oral anticoagulation, hypertension, hyperlipidemia, BPH, chronic depression/anxiety, and hearing impairment who presents to the ED for evaluation of right foot wound.  Has had a chronic nonhealing right foot wound related to his severe peripheral vascular disease. He underwent wound debridement on 06/23/2019. Afterwards he has noticed clear drainage from debridement site as well as increased erythema and some desquamation of dorsal foot skin. He has had increased pain at night and with direct stimulation around the wound site.  Denies subjective fevers, chills, diaphoresis. He reports chronic intermittent chest pain and dyspnea related to his COPD which is not significantly changed. He reports a history of difficulty with urine output.  Seen by vascular surgery.  Post ultrasound-guided cannulation of left brachial artery, aortogram with bilateral lower extremity runoff done on 06/30/2019 by vascular surgery Dr. Donzetta Matters.  Revealed severe peripheral vascular disease with multilevel arterial occlusive disease.  Per vascular surgery, he would be at very high risk for any surgery given his age and multiple medical comorbidities including severe COPD, coronary artery disease, and congestive heart failure.   07/02/19: Seen this AM with spouse at bedside. Worsening B/L LE edema. PTA home lasix restarted. Reports spasms in his legs keeping him from sleeping at night.   Patient requests to be discharged to home.  Will follow up with palliative care outpatient.    Hospital Course:  Principal Problem:   Cellulitis of right lower extremity Active Problems:   Hyperlipidemia   Essential hypertension   Chronic respiratory failure with hypoxia  and hypercapnia ([place])   Coronary artery disease involving coronary bypass graft of native heart with angina pectoris ([facility])   Peripheral vascular disease ([facility])   BPH (benign prostatic  hyperplasia)   GERD (gastroesophageal reflux disease)   Anxiety   Chronic diastolic CHF (congestive heart failure) ([facility])   COPD (chronic obstructive pulmonary disease) ([place])   Preoperative cardiovascular examination   AKI (acute kidney injury) ([place])   Cellulitis of left lower extremity   Pressure injury of skin   DNR (do not resuscitate)   Voiding difficulty   Palliative care by specialist   Severe PVD with total occlusion of right SFA with chronic nonhealing right foot wound and skin changes involving the right lower extremity with concern for cellulitis -Patient is well-known to Dr. Deitra Mayo -Post ultrasound-guided cannulation of left brachial artery, aortogram with bilateral lower extremity runoff done on 06/30/2019 by vascular surgery Dr. Donzetta Matters.  Revealed severe peripheral vascular disease with multilevel arterial occlusive disease.  Per vascular surgery, he would be at very high risk for any surgery given his age and multiple medical comorbidities including severe COPD, coronary artery disease, and congestive heart failure. -Wound specialist consulted for local wound care.  Received 5 days IV antibiotics. Received gentle IV fluid hydration in the setting of IV contrast for 24 hours.  Critical limb ischemia/with concern for cellulitis, possible early wet gangrene Patient does not wish to proceed with surgery Will follow up with vasc surgery in 2-3 weeks Completed 5 days of empiric IV antibiotics, rocephin  MRSA screening negative Continue local wound care Blood cultures negative to date Superficial wound culture negative to date Clindamycin 300 mg QID x 5 days prescribed.  Will need to closely follow up with wound care and PCP.  Probiotics also prescribed to maintain GI flora. Follow up with vascular surgery.  Resolved AKI: Baseline creatinine appears to be 0.9 with GFR greater than 60 Presented with creatinine of 1.44 with GFR 43 Received IV fluid hydration to avoid  contrast-induced nephrotoxicity for 24 hours Renal function back to baseline Cr. 0.9 with GFR>60 Continue to avoid nephrotoxins and hypotension Follow up with PCP  Hx of ureteral tumor 05/10/2016/R renal calculus US renal 07/02/19 prominent prostate, lower pole R renal calculus without obstructive change Bladder partially distended Follow up with Urology  BPH Continue finasteride and Rapaflo Continue to monitor urine output Urology outpatient follow up  Essential hypertension Decrease dose of Toprol-XL down to 12.5 mg daily Spironolactone on hold Follow up with cardiology  COPD/chronic hypoxic hypercarbic respiratory failure: -On 2 L O2 via [place] at baseline -Currently stable. -Continue supplemental oxygen to maintain O2 saturation greater than 88-90% -Continue home bronchodilators -Patient uses trilogy at homenightly, continue  Chronic diastolic CHF: -Patient has chronic bilateral lower extremity edema -Continue strict I's and O's and daily weight -Restarted home po lasix Follow up with cards  CAD s/p CABG Hyperlipidemia: -Continue aspirin and statin -Follow up with cardiology  Paroxysmal atrial fibrillation: In sinus rhythm.  Not on anticoagulation.  Rate controlled on Toprol  Ambulatory dysfunction Home PT OT with assistance and fall precautions  GERD: Stable Continue Pepcid.  Chronic anxiety Continue prior to admission Xanax as needed.  Coccyx, pressure wound stage 1, POA Continue local wound care  Goals of care Palliative Care team has been consulted and following CODE STATUS changed to DNR on 07/01/2019. Meeting with Palliative care team completed 07/02/2019.       Code Status:DNR Family Communication: Wife at bedside   Consultants:  Vascular surgery (  Dr. Servando Snare)  Cardiology  Palliative medicine team  Procedures: Post ultrasound-guided cannulation of left brachial artery, aortogram with bilateral lower  extremity runoff done on 06/30/2019 by vascular surgery Dr. Donzetta Matters.  Antimicrobials:   Completed 5 days of IV Rocephin.  Clindamycin 300 mg QID x 5 days prescribed.  Will need to closely follow up with wound care and PCP.     Discharge Exam: BP 116/72 (BP Location: Left Arm)   Pulse 98   Temp 98.3 F (36.8 C) (Oral)   Resp 16   Ht 5\' 9"  (1.753 m)   Wt 64.3 kg   SpO2 98%   BMI 20.93 kg/m  . General: 86 y.o. year-old male pleasant, well developed well nourished in no acute distress.  Alert and oriented x3. . Cardiovascular: Regular rate and rhythm with no rubs or gallops.  No thyromegaly or JVD noted.   Marland Kitchen Respiratory: Clear to auscultation with no wheezes or rales. Good inspiratory effort. . Abdomen: Soft nontender nondistended with normal bowel sounds x4 quadrants.  Musculoskeletal: 2+ pitting edema lower extremity bilaterally.  Unable to palpate dorsalis pedis pulses.  Right lower extremity edema and erythema.  Wound present R foot laterally, first R great toe dry wound.  Psychiatry: Mood is appropriate for condition and setting  Discharge Instructions You were cared for by a hospitalist during your hospital stay. If you have any questions about your discharge medications or the care you received while you were in the hospital after you are discharged, you can call the unit and asked to speak with the hospitalist on call if the hospitalist that took care of you is not available. Once you are discharged, your primary care physician will handle any further medical issues. Please note that NO REFILLS for any discharge medications will be authorized once you are discharged, as it is imperative that you return to your primary care physician (or establish a relationship with a primary care physician if you do not have one) for your aftercare needs so that they can reassess your need for medications and monitor your lab values.   Allergies as of 07/02/2019      Reactions   Cefdinir  Diarrhea, Other (See Comments)   Severe Diarrhea   Nitrofurantoin Swelling, Other (See Comments)   Hand Swelling   Sulfa Antibiotics Anaphylaxis, Swelling   Sulfonamide Derivatives Swelling, Other (See Comments)   Facial/tongue swelling   Tape Other (See Comments)   SKIN IS VERY THIN AND TEARS EASILY!!!!! Please do NOT use "plastic" tape- USE PAPER!!   Ciprofloxacin Itching, Rash, Other (See Comments)   Red itchy hands   Nitrofurantoin Swelling, Other (See Comments)   Swollen hands   Amoxicillin Er Other (See Comments)   Frequest urination   Doxycycline Other (See Comments)   "Felt terrible"   Levaquin [levofloxacin] Itching, Rash   Sertraline Anxiety, Other (See Comments)   Makes the patient jittery      Medication List    STOP taking these medications   spironolactone 25 MG tablet Commonly known as: ALDACTONE     TAKE these medications   ALPRAZolam 0.25 MG tablet Commonly known as: XANAX TAKE 1 TABLET BY MOUTH THREE TIMES DAILY AS NEEDED What changed:   how much to take  how to take this  when to take this  reasons to take this  additional instructions   aspirin 81 MG tablet Take 81 mg by mouth daily. What changed: Another medication with the same name was removed. Continue taking this  medication, and follow the directions you see here.   atorvastatin 20 MG tablet Commonly known as: LIPITOR TAKE 1 TABLET BY MOUTH EVERY DAY What changed: when to take this   betamethasone dipropionate 0.05 % cream Apply topically 2 (two) times daily. Apply to both feet twice daily for 4 weeks.   clindamycin 300 MG capsule Commonly known as: CLEOCIN Take 1 capsule (300 mg total) by mouth 4 (four) times daily for 5 days. Please take with a full glass of water with or without food to avoid GI symptoms   feeding supplement (K[employer] [place]AR[place] STANDARD 1.4) Liqd liquid Take 325 mLs by mouth 2 (two) times daily between meals for 7 days. Start taking on: July 03, 2019   finasteride 5  MG tablet Commonly known as: PROSCAR Take 5 mg by mouth daily with lunch.   fluticasone 50 MCG/ACT nasal spray Commonly known as: FLONASE Place 2 sprays into both nostrils daily.   furosemide 20 MG tablet Commonly known as: LASIX Take 1 tablet (20 mg total) by mouth 2 (two) times daily.   levalbuterol 0.63 MG/3ML nebulizer solution Commonly known as: XOPENEX USE 1 VIAL VIA NEBULIZER EVERY 4 HOURS AS NEEDED FOR WHEEZING OR SHORTNESS OF BREATH What changed:   how much to take  how to take this  when to take this  reasons to take this  additional instructions   levalbuterol 45 MCG/ACT inhaler Commonly known as: Xopenex HFA Inhale 2 puffs into the lungs every 4 (four) hours as needed for wheezing or shortness of breath.   metoprolol succinate 25 MG 24 hr tablet Commonly known as: TOPROL-XL Take 0.5 tablets (12.5 mg total) by mouth daily. Start taking on: July 03, 2019 What changed:   how much to take  Another medication with the same name was removed. Continue taking this medication, and follow the directions you see here.   mometasone 50 MCG/ACT nasal spray Commonly known as: NASONEX Place 2 sprays into the nose daily.   Nitrostat 0.4 MG SL tablet Generic drug: nitroGLYCERIN Place 0.4 mg under the tongue every 5 (five) minutes as needed for chest pain. What changed: Another medication with the same name was removed. Continue taking this medication, and follow the directions you see here.   NONFORMULARY OR COMPOUNDED ITEM Apply 1 application topically See admin instructions. Triamcinolone/Cetaphil compounded cream- Apply to both legs once a day   OXYGEN Inhale 2 L/min into the lungs continuous.   Pepcid 20 MG tablet Generic drug: famotidine Take 20 mg by mouth at bedtime.   predniSONE 10 MG tablet Commonly known as: DELTASONE FOR FLARE OF COUGH/WHEEZING MAY INCREASE TO 2 DAILY BETTER, THEN 1 DAILY What changed:   how much to take  how to take  this  when to take this  additional instructions   PRESCRIPTION MEDICATION See admin instructions. [place] Trilogy ventilator- At bedtime   PreserVision AREDS 2 Caps Take 1 capsule by mouth daily.   saccharomyces boulardii 250 MG capsule Commonly known as: FLORASTOR Take 1 capsule (250 mg total) by mouth 2 (two) times daily for 14 days.   silodosin 4 MG Caps capsule Commonly known as: RAPAFLO Take 4 mg by mouth daily.   sodium chloride 5 % ophthalmic solution Commonly known as: MURO 128 Place 1 drop into both eyes 4 (four) times daily.   Spiriva Respimat 2.5 MCG/ACT Aers Generic drug: Tiotropium Bromide Monohydrate INHALE 2 PUFFS BY MOUTH EVERY DAY What changed:   how much to take  how to take  this  when to take this  additional instructions   Symbicort 160-4.5 MCG/ACT inhaler Generic drug: budesonide-formoterol Inhale 2 puffs into the lungs 2 (two) times daily.   vitamin B-12 1000 MCG tablet Commonly known as: CYANOCOBALAMIN Take 1,000 mcg by mouth daily.   Vitamin D3 50 MCG (2000 UT) Tabs Take 2,000 Units by mouth daily with lunch.      Allergies  Allergen Reactions  . Cefdinir Diarrhea and Other (See Comments)    Severe Diarrhea  . Nitrofurantoin Swelling and Other (See Comments)    Hand Swelling  . Sulfa Antibiotics Anaphylaxis and Swelling  . Sulfonamide Derivatives Swelling and Other (See Comments)    Facial/tongue swelling  . Tape Other (See Comments)    SKIN IS VERY THIN AND TEARS EASILY!!!!! Please do NOT use "plastic" tape- USE PAPER!!  . Ciprofloxacin Itching, Rash and Other (See Comments)    Red itchy hands  . Nitrofurantoin Swelling and Other (See Comments)    Swollen hands  . Amoxicillin Er Other (See Comments)    Frequest urination  . Doxycycline Other (See Comments)    "Felt terrible"  . Levaquin [Levofloxacin] Itching and Rash  . Sertraline Anxiety and Other (See Comments)    Makes the patient jittery    Follow-up Information     [name], MD. Call in 1 day(s).   Specialty: Family Medicine Why: Please call for a post hospital follow up appointment Contact information: [place] [address] [phone]        Belva Crome, MD .   Specialty: Cardiology Contact information: [address] [address] [phone]        [facility] Follow up.   Contact information: [place], [facility] Follow up.   Why: for home health services. They will call within 48 of being home to set up your first home appointment Contact information: [place] [address] [phone]        Waynetta Sandy, MD. Call in 1 day(s).   Specialties: Vascular Surgery, Cardiology Why: Call for a post hospital follow up appointment. Contact information: [address] [place] [address] [phone]                The results of significant diagnostics from this hospitalization (including imaging, microbiology, ancillary and laboratory) are listed below for reference.    Significant Diagnostic Studies: DG Chest 2 View  Result Date: 06/27/2019 CLINICAL DATA:  Possible sepsis.  Nonhealing right foot wounds. EXAM: CHEST - 2 VIEW COMPARISON:  Radiographs 02/11/2019.  CT 03/08/2017. FINDINGS: The heart size and mediastinal contours are stable status post median sternotomy and CABG. The lungs are hyperinflated with chronic biapical scarring and blunting of both costophrenic angles. No edema, confluent airspace opacity, pleural effusion or pneumothorax. The bones appear unchanged. IMPRESSION: Stable postoperative chest with evidence of chronic obstructive pulmonary disease. No acute cardiopulmonary process. Electronically Signed   By: Richardean Sale M.D.   On: 06/27/2019 14:29   DG Ankle Complete Right  Result Date: 06/27/2019 CLINICAL  DATA:  Right foot wound infection. EXAM: RIGHT ANKLE - COMPLETE 3+ VIEW COMPARISON:  None. FINDINGS: There is no evidence of fracture, dislocation, or joint effusion. There is no evidence of arthropathy or other focal bone abnormality. Moderate to marked severity diffuse soft tissue swelling is seen. There is mild vascular calcification. IMPRESSION:  Moderate to marked severity diffuse soft tissue swelling without acute osseous abnormality. Electronically Signed   By: Virgina Norfolk M.D.   On: 06/27/2019 16:32   US RENAL  Result Date: 07/02/2019 CLINICAL DATA:  Difficulty voiding, history of ureteral tumor EXAM: RENAL / URINARY TRACT ULTRASOUND COMPLETE COMPARISON:  12/03/2017 CT FINDINGS: Right Kidney: Renal measurements: 9.4 x 4.2 x 4.9 cm. = volume: 102 mL. 15 mm lower pole renal calculus is noted without obstructive change. Left Kidney: Renal measurements: 10.3 x 5.8 x 5.1 cm. = volume: 159 mL. Echogenicity within normal limits. No mass or hydronephrosis visualized. Bladder: Bladder is partially distended. Prostate is enlarged indenting upon the inferior aspect of the bladder. Other: None. IMPRESSION: Prominent prostate. Lower pole right renal calculus without obstructive change. This is stable from the prior exam Electronically Signed   By: [name] M.D.   On: 07/02/2019 11:35   PERIPHERAL VASCULAR CATHETERIZATION  Result Date: 06/30/2019 Patient name: [name] MRN: [id number] DOB: [birth year] Sex: male 06/30/2019 Pre-operative Diagnosis: Critical right lower extremity ischemia with wounds Post-operative diagnosis:  Same Surgeon:  Erlene Quan C. Donzetta Matters, MD Procedure Performed: 1.  Ultrasound-guided cannulation left brachial artery 2.  Aortogram with bilateral lower extremity runoff 3.  Moderate sedation with fentanyl and Versed for 46 minutes Indications: 86 year old male with right foot ulceration and has ABIs of 0.  By previous CT has evidence of severe aortoiliac occlusive disease and occluded common  femoral and severe SFA disease.  He is indicated for angiography to determine possible surgical options. Findings: Aorta was healthy down to the level of the renal arteries below which there is severe calcification.  He has severe iliac calcification bilaterally but actually does not appear to have a significant flow-limiting stenosis.  The right common femoral artery is occluded with heavy calcification.  He does reconstitute a profunda femoris after several centimeters.  He also reconstitutes the below-knee popliteal artery and appears to have posterior tibial peroneal runoff to the ankle although there is heavy calcification of the popliteal below the knee.  On the left side become femoral artery is heavily diseased the SFA is flush occluded there is runoff in the profunda.  Similarly has disease throughout his SFA is likely occluded and reconstitutes the popliteal artery with runoff via the peroneal and posterior tibial artery. I will discussed the outcomes of today's procedure with the family and determined the best plan moving forward for this patient. Procedure:  The patient was identified in the holding area and taken to room 8.  The patient was then placed supine on the table and prepped and draped in the usual sterile fashion.  A time out was called.  Ultrasound was used to evaluate the left brachial artery.  I did identify the nerve and the area was anesthetized 1% lidocaine.  Moderate sedation was administered his vital signs were monitored throughout the case.  We cannulated the brachial artery did appear to miss the median nerve with a micropuncture needle followed the wire and sheath.  I placed a versa core wire followed by 5 [place] sheath.  I used a Berenstein catheter to select the descending thoracic aorta.  I placed a wire into the infrarenal aorta and then placed a pigtail catheter.  Aortogram was performed followed by lower extremity runoff with the above findings.  Satisfied we removed the  catheter over wire and the wire was removed.  Sheath will be pulled in postoperative holding.  He tolerated procedure well any complication. Contrast: 117cc Erlene Quan  C. Donzetta Matters, MD [facility] Office: [phone] Pager: [phone]   DG Foot Complete Right  Result Date: 06/27/2019 CLINICAL DATA:  Right foot wound infection. EXAM: RIGHT FOOT COMPLETE - 3+ VIEW COMPARISON:  None. FINDINGS: There is no evidence of fracture or dislocation. There is no evidence of arthropathy or other focal bone abnormality. Marked severity soft tissue swelling is seen involving predominantly the dorsal aspect of the mid and distal right foot. Mild to moderate severity vascular calcification is noted. IMPRESSION: Marked severity soft tissue swelling without evidence of acute osseous abnormality. Electronically Signed   By: Virgina Norfolk M.D.   On: 06/27/2019 16:31   VAS [place] ABI WITH/WO TBI  Result Date: 06/29/2019 LOWER EXTREMITY DOPPLER STUDY Indications: Ulceration, and peripheral artery disease. High Risk Factors: Hypertension, hyperlipidemia, past history of smoking,                    coronary artery disease. Other Factors: COPD on home O2. Carotid artery disease. CHF.  Limitations: Today's exam was limited due to edema. Comparison Study: prior study from 11/13/18 is available for comparison Performing Technologist: Sharion Dove RVS  Examination Guidelines: A complete evaluation includes at minimum, Doppler waveform signals and systolic blood pressure reading at the level of bilateral brachial, anterior tibial, and posterior tibial arteries, when vessel segments are accessible. Bilateral testing is considered an integral part of a complete examination. Photoelectric Plethysmograph (PPG) waveforms and toe systolic pressure readings are included as required and additional duplex testing as needed. Limited examinations for reoccurring indications may be performed as noted.  ABI Findings:  +---------+------------------+-----+---------+--------+ Right    Rt Pressure (mmHg)IndexWaveform Comment  +---------+------------------+-----+---------+--------+ Brachial 128                    triphasic         +---------+------------------+-----+---------+--------+ PTA      0                 0.00 absent            +---------+------------------+-----+---------+--------+ DP       0                 0.00 absent            +---------+------------------+-----+---------+--------+ Great Toe0                 0.00 Absent            +---------+------------------+-----+---------+--------+ +---------+------------------+-----+-------------------+-------+ Left     Lt Pressure (mmHg)IndexWaveform           Comment +---------+------------------+-----+-------------------+-------+ Brachial 123                    triphasic                  +---------+------------------+-----+-------------------+-------+ PTA      254               1.98 dampened monophasic        +---------+------------------+-----+-------------------+-------+ DP       96                0.75 dampened monophasic        +---------+------------------+-----+-------------------+-------+ Great Toe26                0.20                            +---------+------------------+-----+-------------------+-------+ +-------+-----------+-----------+------------+------------+  ABI/TBIToday's ABIToday's TBIPrevious ABIPrevious TBI +-------+-----------+-----------+------------+------------+ Right  0          0          1.55        0            +-------+-----------+-----------+------------+------------+ Left   1.98       .20        1.55        0            +-------+-----------+-----------+------------+------------+ Arterial wall calcification precludes accurate ankle pressures and ABIs. Right ABIs appear decreased compared to prior study on 11/2018. Left ABIs appear essentially unchanged compared to prior  study on 11/2018.  Summary: Right: Resting right ankle-brachial index indicates critical limb ischemia. The right toe-brachial index is abnormal. Left: Resting left ankle-brachial index indicates noncompressible left lower extremity arteries. The left toe-brachial index is abnormal.  *See table(s) above for measurements and observations.  Electronically signed by Servando Snare MD on 06/29/2019 at 10:55:26 AM.    Final    VAS [place] LOWER EXTREMITY VENOUS (DVT) (ONLY [facility] & [facility] 7a-7p)  Result Date: 06/27/2019  Lower Venous DVTStudy Indications: Edema. Other Indications: Known severe multilevel arterial occlusive disease. Comparison Study: no prior Performing Technologist: Abram Sander RVS  Examination Guidelines: A complete evaluation includes B-mode imaging, spectral Doppler, color Doppler, and power Doppler as needed of all accessible portions of each vessel. Bilateral testing is considered an integral part of a complete examination. Limited examinations for reoccurring indications may be performed as noted. The reflux portion of the exam is performed with the patient in reverse Trendelenburg.  +---------+---------------+---------+-----------+----------+--------------+ RIGHT    CompressibilityPhasicitySpontaneityPropertiesThrombus Aging +---------+---------------+---------+-----------+----------+--------------+ CFV      Full           Yes      Yes                                 +---------+---------------+---------+-----------+----------+--------------+ SFJ      Full                                                        +---------+---------------+---------+-----------+----------+--------------+ FV Prox  Full                                                        +---------+---------------+---------+-----------+----------+--------------+ FV Mid   Full                                                        +---------+---------------+---------+-----------+----------+--------------+ FV  DistalFull                                                        +---------+---------------+---------+-----------+----------+--------------+ PFV      Full                                                        +---------+---------------+---------+-----------+----------+--------------+  POP      Full           Yes      Yes                                 +---------+---------------+---------+-----------+----------+--------------+ PTV      Full                                                        +---------+---------------+---------+-----------+----------+--------------+ PERO                                                  Not visualized +---------+---------------+---------+-----------+----------+--------------+   +----+---------------+---------+-----------+----------+--------------+ LEFTCompressibilityPhasicitySpontaneityPropertiesThrombus Aging +----+---------------+---------+-----------+----------+--------------+ CFV Full           Yes      Yes                                 +----+---------------+---------+-----------+----------+--------------+     Summary: RIGHT: - There is no evidence of deep vein thrombosis in the lower extremity.  - No cystic structure found in the popliteal fossa.  LEFT: - No evidence of common femoral vein obstruction.  *See table(s) above for measurements and observations. Electronically signed by Servando Snare MD on 06/27/2019 at 4:30:51 PM.    Final     Microbiology: Recent Results (from the past 240 hour(s))  Culture, blood (Routine x 2)     Status: None   Collection Time: 06/27/19  1:45 PM   Specimen: BLOOD  Result Value Ref Range Status   Specimen Description BLOOD SITE NOT SPECIFIED  Final   Special Requests   Final    BOTTLES DRAWN AEROBIC AND ANAEROBIC Blood Culture adequate volume   Culture   Final    NO GROWTH 5 DAYS Performed at [facility], [address] [place], [place] [address]    Report Status  07/02/2019 FINAL  Final  Culture, blood (Routine x 2)     Status: None   Collection Time: 06/27/19  4:56 PM   Specimen: BLOOD  Result Value Ref Range Status   Specimen Description BLOOD BLOOD LEFT FOREARM  Final   Special Requests   Final    BOTTLES DRAWN AEROBIC AND ANAEROBIC Blood Culture results may not be optimal due to an excessive volume of blood received in culture bottles   Culture   Final    NO GROWTH 5 DAYS Performed at [facility], [place] [address], [place], [place] [address]    Report Status 07/02/2019 FINAL  Final  SARS Coronavirus 2 by RT PCR (hospital order, performed in [facility] hospital lab) Nasopharyngeal Nasopharyngeal Swab     Status: None   Collection Time: 06/27/19  7:01 PM   Specimen: Nasopharyngeal Swab  Result Value Ref Range Status   SARS Coronavirus 2 NEGATIVE NEGATIVE Final    Comment: (NOTE) SARS-CoV-2 target nucleic acids are NOT DETECTED.  The SARS-CoV-2 RNA is generally detectable in upper and lower respiratory specimens during the acute phase of infection. The lowest concentration of SARS-CoV-2 viral copies this assay can  detect is 250 copies / mL. A negative result does not preclude SARS-CoV-2 infection and should not be used as the sole basis for treatment or other patient management decisions.  A negative result may occur with improper specimen collection / handling, submission of specimen other than nasopharyngeal swab, presence of viral mutation(s) within the areas targeted by this assay, and inadequate number of viral copies (<250 copies / mL). A negative result must be combined with clinical observations, patient history, and epidemiological information.  Fact Sheet for Patients:   StrictlyIdeas.no  Fact Sheet for Healthcare Providers: BankingDealers.co.za  This test is not yet approved or  cleared by the [place] FDA and has been authorized for detection and/or diagnosis of  SARS-CoV-2 by FDA under an Emergency Use Authorization (EUA).  This EUA will remain in effect (meaning this test can be used) for the duration of the COVID-19 declaration under Section 564(b)(1) of the Act, 21 U.S.C. section 360bbb-3(b)(1), unless the authorization is terminated or revoked sooner.  Performed at [facility], [place] [address], [place], [name] [address]   MRSA PCR Screening     Status: None   Collection Time: 06/30/19  3:47 PM   Specimen: Nasal Mucosa; Nasopharyngeal  Result Value Ref Range Status   MRSA by PCR NEGATIVE NEGATIVE Final    Comment:        The GeneXpert MRSA Assay (FDA approved for NASAL specimens only), is one component of a comprehensive MRSA colonization surveillance program. It is not intended to diagnose MRSA infection nor to guide or monitor treatment for MRSA infections. Performed at [facility], [place] [address], [place], [place] [address]   Aerobic Culture (superficial specimen)     Status: None (Preliminary result)   Collection Time: 07/01/19 10:33 PM   Specimen: Wound  Result Value Ref Range Status   Specimen Description WOUND RIGHT FOOT  Final   Special Requests NONE  Final   Gram Stain   Final    RARE WBC PRESENT, PREDOMINANTLY PMN NO ORGANISMS SEEN Performed at [facility], [place] [address] [place], [place] [address]    Culture PENDING  Incomplete   Report Status PENDING  Incomplete     Labs: Basic Metabolic Panel: Recent Labs  Lab 06/27/19 1422 06/28/19 0231 06/30/19 1314 07/01/19 0604 07/02/19 0438  NA 140 139 139 139 136  K 3.6 4.1 3.9 4.3 4.0  CL 96* 97* 101 101 99  CO2 33* 33* 29 28 28   GLUCOSE 139* 85 93 86 81  BUN 26* 23 17 15 15   CREATININE 1.44* 1.14 0.96 1.07 0.93  CALCIUM 9.5 9.2 8.7* 8.8* 8.7*  MG  --   --  2.0  --   --   PHOS  --   --  2.6  --   --    Liver Function Tests: Recent Labs  Lab 06/27/19 1422  AST 22  ALT 18  ALKPHOS 59  BILITOT 0.8  PROT 5.9*  ALBUMIN 3.8   No  results for input(s): LIPASE, AMYLASE in the last 168 hours. No results for input(s): AMMONIA in the last 168 hours. CBC: Recent Labs  Lab 06/27/19 1422 06/28/19 0231 06/30/19 1314 07/01/19 0604 07/02/19 0438  WBC 10.2 9.7 10.1 8.6 7.4  NEUTROABS 8.2*  --  7.8* 6.7 5.8  HGB 12.6* 11.0* 11.7* 10.9* 10.4*  HCT 39.1 34.7* 35.8* 33.4* 31.7*  MCV 99.7 99.7 100.0 99.1 98.8  PLT 206 182 193 167 179   Cardiac  Enzymes: No results for input(s): CKTOTAL, CKMB, CKMBINDEX, TROPONINI in the last 168 hours. BNP: BNP (last 3 results) No results for input(s): BNP in the last 8760 hours.  ProBNP (last 3 results) Recent Labs    07/10/18 1604  PROBNP 648*    CBG: No results for input(s): GLUCAP in the last 168 hours.     Signed:  Kayleen Memos, MD [facility] 07/02/2019, 7:00 PM

## 2019-07-02 NOTE — Progress Notes (Signed)
Manufacturing engineer [facility])  Joint meeting with PMT to discuss [place] for Mr. Hawker.  He had already made the decision this am with [facility] manager to d/c home with home health.   During this meeting, Mr. Manlove is only concerned with going home before deciding if he would like hospice care or not.  Explained that with or without the amputation he is likely eligible for hospice.  He just wants to go home today and decide later.  [facility] will follow him with our palliative services once he is discharged back home.  ** @ 545 pm, pt family called our hospice triage number to say that he was not d/c yet.  Advised there is nothing this writer could do to speed that process up.  Advised for them to advocate for themselves and request to be discharged. Epic chat sent to attending, RN and PMT.

## 2019-07-02 NOTE — TOC Progression Note (Signed)
Transition of Care [facility]) - Progression Note    Patient Details  Name: ROBINSON BRINKLEY MRN: [id number] Date of Birth: Apr 29, 1929  Transition of Care [facility]) CM/SW Contact  Carles Collet, RN Phone Number: 07/02/2019, 10:25 AM  Clinical Narrative:   Followed up with patient and spouse at bedside.  Choice was made for [facility] for RN PT HHA. Referral accepted by Santiago Glad. [place] to continue following for palliative services at DC as established prior to admission. Patient has DME RW transport chair, trilogy and home oxygen through [employer]. No additional DME needs at this time.     Expected Discharge Plan: [place] Barriers to Discharge: Continued Medical Work up  Expected Discharge Plan and Services Expected Discharge Plan: [place]   Discharge Planning Services: CM Consult Post Acute Care Choice: [place] arrangements for the past 2 months: Single Family Home                           HH Arranged: RN, PT, Nurse's Aide [place] Agency: [place] ([facility]) Date [place]: 07/02/19 Time [place]: 1025 Representative spoke with at [place]: [place] ([place]) Interventions    Readmission Risk Interventions No flowsheet data found.

## 2019-07-02 NOTE — Discharge Instructions (Signed)
Peripheral Vascular Disease  Peripheral vascular disease (PVD) is a disease of the blood vessels that are not part of your heart and brain. A simple term for PVD is poor circulation. In most cases, PVD narrows the blood vessels that carry blood from your heart to the rest of your body. This can reduce the supply of blood to your arms, legs, and internal organs, like your stomach or kidneys. However, PVD most often affects a person's lower legs and feet. Without treatment, PVD tends to get worse. PVD can also lead to acute ischemic limb. This is when an arm or leg suddenly cannot get enough blood. This is a medical emergency. Follow these instructions at home: Lifestyle  Do not use any products that contain nicotine or tobacco, such as cigarettes and e-cigarettes. If you need help quitting, ask your doctor.  Lose weight if you are overweight. Or, stay at a healthy weight as told by your doctor.  Eat a diet that is low in fat and cholesterol. If you need help, ask your doctor.  Exercise regularly. Ask your doctor for activities that are right for you. General instructions  Take over-the-counter and prescription medicines only as told by your doctor.  Take good care of your feet: ? Wear comfortable shoes that fit well. ? Check your feet often for any cuts or sores.  Keep all follow-up visits as told by your doctor This is important. Contact a doctor if:  You have cramps in your legs when you walk.  You have leg pain when you are at rest.  You have coldness in a leg or foot.  Your skin changes.  You are unable to get or have an erection (erectile dysfunction).  You have cuts or sores on your feet that do not heal. Get help right away if:  Your arm or leg turns cold, numb, and blue.  Your arms or legs become red, warm, swollen, painful, or numb.  You have chest pain.  You have trouble breathing.  You suddenly have weakness in your face, arm, or leg.  You become very  confused or you cannot speak.  You suddenly have a very bad headache.  You suddenly cannot see. Summary  Peripheral vascular disease (PVD) is a disease of the blood vessels.  A simple term for PVD is poor circulation. Without treatment, PVD tends to get worse.  Treatment may include exercise, low fat and low cholesterol diet, and quitting smoking. This information is not intended to replace advice given to you by your health care provider. Make sure you discuss any questions you have with your health care provider. Document Revised: 12/08/2016 Document Reviewed: 02/03/2016 Elsevier Patient Education  [place]. Cellulitis, Adult  Cellulitis is a skin infection. The infected area is often warm, red, swollen, and sore. It occurs most often in the arms and lower legs. It is very important to get treated for this condition. What are the causes? This condition is caused by bacteria. The bacteria enter through a break in the skin, such as a cut, burn, insect bite, open sore, or crack. What increases the risk? This condition is more likely to occur in people who:  Have a weak body defense system (immune system).  Have open cuts, burns, bites, or scrapes on the skin.  Are older than 84 years of age.  Have a blood sugar problem (diabetes).  Have a long-lasting (chronic) liver disease (cirrhosis) or kidney disease.  Are very overweight (obese).  Have a skin problem,  such as: ? Itchy rash (eczema). ? Slow movement of blood in the veins (venous stasis). ? Fluid buildup below the skin (edema).  Have been treated with high-energy rays (radiation).  Use IV drugs. What are the signs or symptoms? Symptoms of this condition include:  Skin that is: ? Red. ? Streaking. ? Spotting. ? Swollen. ? Sore or painful when you touch it. ? Warm.  A fever.  Chills.  Blisters. How is this diagnosed? This condition is diagnosed based on:  Medical history.  Physical  exam.  Blood tests.  Imaging tests. How is this treated? Treatment for this condition may include:  Medicines to treat infections or allergies.  Home care, such as: ? Rest. ? Placing cold or warm cloths (compresses) on the skin.  Hospital care, if the condition is very bad. Follow these instructions at home: Medicines  Take over-the-counter and prescription medicines only as told by your doctor.  If you were prescribed an antibiotic medicine, take it as told by your doctor. Do not stop taking it even if you start to feel better. General instructions   Drink enough fluid to keep your pee (urine) pale yellow.  Do not touch or rub the infected area.  Raise (elevate) the infected area above the level of your heart while you are sitting or lying down.  Place cold or warm cloths on the area as told by your doctor.  Keep all follow-up visits as told by your doctor. This is important. Contact a doctor if:  You have a fever.  You do not start to get better after 1-2 days of treatment.  Your bone or joint under the infected area starts to hurt after the skin has healed.  Your infection comes back. This can happen in the same area or another area.  You have a swollen bump in the area.  You have new symptoms.  You feel ill and have muscle aches and pains. Get help right away if:  Your symptoms get worse.  You feel very sleepy.  You throw up (vomit) or have watery poop (diarrhea) for a long time.  You see red streaks coming from the area.  Your red area gets larger.  Your red area turns dark in color. These symptoms may represent a serious problem that is an emergency. Do not wait to see if the symptoms will go away. Get medical help right away. Call your local emergency services (911 in the [place] Do not drive yourself to the hospital. Summary  Cellulitis is a skin infection. The area is often warm, red, swollen, and sore.  This condition is treated with  medicines, rest, and cold and warm cloths.  Take all medicines only as told by your doctor.  Tell your doctor if symptoms do not start to get better after 1-2 days of treatment. This information is not intended to replace advice given to you by your health care provider. Make sure you discuss any questions you have with your health care provider. Document Revised: 05/17/2017 Document Reviewed: 05/17/2017 Elsevier Patient Education  [place].  Peripheral Vascular Disease  Peripheral vascular disease (PVD) is a disease of the blood vessels that are not part of your heart and brain. A simple term for PVD is poor circulation. In most cases, PVD narrows the blood vessels that carry blood from your heart to the rest of your body. This can reduce the supply of blood to your arms, legs, and internal organs, like your stomach or kidneys. However,  PVD most often affects a person's lower legs and feet. Without treatment, PVD tends to get worse. PVD can also lead to acute ischemic limb. This is when an arm or leg suddenly cannot get enough blood. This is a medical emergency. Follow these instructions at home: Lifestyle  Do not use any products that contain nicotine or tobacco, such as cigarettes and e-cigarettes. If you need help quitting, ask your doctor.  Lose weight if you are overweight. Or, stay at a healthy weight as told by your doctor.  Eat a diet that is low in fat and cholesterol. If you need help, ask your doctor.  Exercise regularly. Ask your doctor for activities that are right for you. General instructions  Take over-the-counter and prescription medicines only as told by your doctor.  Take good care of your feet: ? Wear comfortable shoes that fit well. ? Check your feet often for any cuts or sores.  Keep all follow-up visits as told by your doctor This is important. Contact a doctor if:  You have cramps in your legs when you walk.  You have leg pain when you are at  rest.  You have coldness in a leg or foot.  Your skin changes.  You are unable to get or have an erection (erectile dysfunction).  You have cuts or sores on your feet that do not heal. Get help right away if:  Your arm or leg turns cold, numb, and blue.  Your arms or legs become red, warm, swollen, painful, or numb.  You have chest pain.  You have trouble breathing.  You suddenly have weakness in your face, arm, or leg.  You become very confused or you cannot speak.  You suddenly have a very bad headache.  You suddenly cannot see. Summary  Peripheral vascular disease (PVD) is a disease of the blood vessels.  A simple term for PVD is poor circulation. Without treatment, PVD tends to get worse.  Treatment may include exercise, low fat and low cholesterol diet, and quitting smoking. This information is not intended to replace advice given to you by your health care provider. Make sure you discuss any questions you have with your health care provider. Document Revised: 12/08/2016 Document Reviewed: 02/03/2016 Elsevier Patient Education  2020 [employer].

## 2019-07-02 NOTE — Progress Notes (Signed)
Initial Nutrition Assessment  RD working remotely.  DOCUMENTATION CODES:   Not applicable  INTERVENTION:  Will discontinue Ensure Enlive per patient request.  Provide Dillard [place] Standard 1.4 Cal vanilla po BID between meals, each supplement provides 455 kcal and 20 grams of protein.  Continue Prosight MVI daily.  Encouraged intake of a high-calorie, high-protein oral nutrition supplement (>/= 350 kcal and >/= 15-16 grams of protein) two to three times daily between meals at home.  NUTRITION DIAGNOSIS:   Increased nutrient needs related to wound healing, catabolic illness (CHF, COPD) as evidenced by estimated needs.  GOAL:   Patient will meet greater than or equal to 90% of their needs  MONITOR:   PO intake, Supplement acceptance, Labs, Weight trends, Skin, I & O's  REASON FOR ASSESSMENT:   Consult Assessment of nutrition requirement/status  ASSESSMENT:   86 year old male with PMHx of HTN, COPD, HLD, PVD, asthma, CAD, CHF, GERD, arthritis admitted with severe PVD with total occlusion of right SFA with chronic nonhealing right foot wound and skin changes involving the right lower extremity complicated by cellulitis, critical limb ischemia/cellulitis (patient would not like to proceed with surgery), AKI.   Called into patient's room and spoke with patient's wife over the phone who provided history. She reports patient may be discharging today. She reports patient has a good appetite. He eats 3 meals per day. For breakfast he usually has granola with frozen fruit and coffee. For lunch he may have cheese tortilla or tuna. Dinner may be salmon. She reports he eats average-sized portions but does not like to have large portions. He dislikes the Ensure Enlive due to the ingredient list. Discussed option of Dillard Essex and wife would like to try this before they leave to see if patient will like it better. Discussed increased calorie/protein needs.  Wife reports patient weighs himself  daily in setting of CHF and he has been weight-stable at around 133 lbs for the past 2 years. She reports he has lost a lot of muscle mass and is "skin and bones" now. RD unable to complete nutrition-focused physical exam as working remotely. Weight currently documented in chart is 64.3 kg (141.76 lbs). Patient documented to have mild pitting generalized edema and deep pitting edema in bilateral lower extremities.  Medications reviewed and include: vitamin D3 2000 units daily, famotidine, Lasix 20 mg BID, Prosight 1 tablet daily, prednisone 10 mg daily, Florastor 250 mg BID, vitamin B12 1000 micrograms daily, ceftriaxone.  Labs reviewed.  Unable to determine if patient meets criteria for malnutrition at this time.  NUTRITION - FOCUSED PHYSICAL EXAM:  Unable to complete as RD is working remotely.  Diet Order:   Diet Order            Diet Heart Room service appropriate? Yes; Fluid consistency: Thin  Diet effective now                EDUCATION NEEDS:   No education needs have been identified at this time  Skin:  Skin Assessment: Skin Integrity Issues: Skin Integrity Issues:: Stage I, Other (Comment) Stage I: coccyx Other: nonpressure wounds bilateral elbows; venous stasis ulcer right foot; cellulitis right lower extremity   Last BM:  Unknown  Height:   Ht Readings from Last 1 Encounters:  06/29/19 5\' 9"  (1.753 m)   Weight:   Wt Readings from Last 1 Encounters:  07/02/19 64.3 kg   BMI:  Body mass index is 20.93 kg/m.  Estimated Nutritional Needs:   Kcal:  1800-2000  Protein:  90-100 grams  Fluid:  1.5 L/day  Jacklynn Barnacle, MS, RD, LDN Pager number available on Amion

## 2019-07-02 NOTE — Progress Notes (Signed)
Nsg Discharge Note  Patient discharged home. Wife and daughter present for discharge teaching. Questions and concerns addressed.  Admit Date:  06/27/2019 Discharge date: 07/02/2019   [name] to be D/C'd Home per MD order.  AVS completed.  Copy for chart, and copy for patient signed, and dated. Patient/caregiver able to verbalize understanding.  Discharge Medication: Allergies as of 07/02/2019      Reactions   Cefdinir Diarrhea, Other (See Comments)   Severe Diarrhea   Nitrofurantoin Swelling, Other (See Comments)   Hand Swelling   Sulfa Antibiotics Anaphylaxis, Swelling   Sulfonamide Derivatives Swelling, Other (See Comments)   Facial/tongue swelling   Tape Other (See Comments)   SKIN IS VERY THIN AND TEARS EASILY!!!!! Please do NOT use "plastic" tape- USE PAPER!!   Ciprofloxacin Itching, Rash, Other (See Comments)   Red itchy hands   Nitrofurantoin Swelling, Other (See Comments)   Swollen hands   Amoxicillin Er Other (See Comments)   Frequest urination   Doxycycline Other (See Comments)   "Felt terrible"   Levaquin [levofloxacin] Itching, Rash   Sertraline Anxiety, Other (See Comments)   Makes the patient jittery      Medication List    STOP taking these medications   spironolactone 25 MG tablet Commonly known as: ALDACTONE     TAKE these medications   ALPRAZolam 0.25 MG tablet Commonly known as: XANAX TAKE 1 TABLET BY MOUTH THREE TIMES DAILY AS NEEDED What changed:   how much to take  how to take this  when to take this  reasons to take this  additional instructions   aspirin 81 MG tablet Take 81 mg by mouth daily. What changed: Another medication with the same name was removed. Continue taking this medication, and follow the directions you see here.   atorvastatin 20 MG tablet Commonly known as: LIPITOR TAKE 1 TABLET BY MOUTH EVERY DAY What changed: when to take this   betamethasone dipropionate 0.05 % cream Apply topically 2 (two) times daily.  Apply to both feet twice daily for 4 weeks.   clindamycin 300 MG capsule Commonly known as: CLEOCIN Take 1 capsule (300 mg total) by mouth 4 (four) times daily for 5 days. Please take with a full glass of water with or without food to avoid GI symptoms   feeding supplement (KATE FARMS STANDARD 1.4) Liqd liquid Take 325 mLs by mouth 2 (two) times daily between meals for 7 days. Start taking on: July 03, 2019   finasteride 5 MG tablet Commonly known as: PROSCAR Take 5 mg by mouth daily with lunch.   fluticasone 50 MCG/ACT nasal spray Commonly known as: FLONASE Place 2 sprays into both nostrils daily.   furosemide 20 MG tablet Commonly known as: LASIX Take 1 tablet (20 mg total) by mouth 2 (two) times daily.   levalbuterol 0.63 MG/3ML nebulizer solution Commonly known as: XOPENEX USE 1 VIAL VIA NEBULIZER EVERY 4 HOURS AS NEEDED FOR WHEEZING OR SHORTNESS OF BREATH What changed:   how much to take  how to take this  when to take this  reasons to take this  additional instructions   levalbuterol 45 MCG/ACT inhaler Commonly known as: Xopenex HFA Inhale 2 puffs into the lungs every 4 (four) hours as needed for wheezing or shortness of breath.   metoprolol succinate 25 MG 24 hr tablet Commonly known as: TOPROL-XL Take 0.5 tablets (12.5 mg total) by mouth daily. Start taking on: July 03, 2019 What changed:   how much to take  Another medication with the same name was removed. Continue taking this medication, and follow the directions you see here.   mometasone 50 MCG/ACT nasal spray Commonly known as: NASONEX Place 2 sprays into the nose daily.   Nitrostat 0.4 MG SL tablet Generic drug: nitroGLYCERIN Place 0.4 mg under the tongue every 5 (five) minutes as needed for chest pain. What changed: Another medication with the same name was removed. Continue taking this medication, and follow the directions you see here.   NONFORMULARY OR COMPOUNDED ITEM Apply 1 application  topically See admin instructions. Triamcinolone/Cetaphil compounded cream- Apply to both legs once a day   OXYGEN Inhale 2 L/min into the lungs continuous.   Pepcid 20 MG tablet Generic drug: famotidine Take 20 mg by mouth at bedtime.   predniSONE 10 MG tablet Commonly known as: DELTASONE FOR FLARE OF COUGH/WHEEZING MAY INCREASE TO 2 DAILY BETTER, THEN 1 DAILY What changed:   how much to take  how to take this  when to take this  additional instructions   PRESCRIPTION MEDICATION See admin instructions. [employer] Trilogy ventilator- At bedtime   PreserVision AREDS 2 Caps Take 1 capsule by mouth daily.   saccharomyces boulardii 250 MG capsule Commonly known as: FLORASTOR Take 1 capsule (250 mg total) by mouth 2 (two) times daily for 14 days.   silodosin 4 MG Caps capsule Commonly known as: RAPAFLO Take 4 mg by mouth daily.   sodium chloride 5 % ophthalmic solution Commonly known as: MURO 128 Place 1 drop into both eyes 4 (four) times daily.   Spiriva Respimat 2.5 MCG/ACT Aers Generic drug: Tiotropium Bromide Monohydrate INHALE 2 PUFFS BY MOUTH EVERY DAY What changed:   how much to take  how to take this  when to take this  additional instructions   Symbicort 160-4.5 MCG/ACT inhaler Generic drug: budesonide-formoterol Inhale 2 puffs into the lungs 2 (two) times daily.   vitamin B-12 1000 MCG tablet Commonly known as: CYANOCOBALAMIN Take 1,000 mcg by mouth daily.   Vitamin D3 50 MCG (2000 UT) Tabs Take 2,000 Units by mouth daily with lunch.       Discharge Assessment: Vitals:   07/02/19 1237 07/02/19 1741  BP: 130/71 116/72  Pulse: 96 98  Resp: 16 16  Temp: 98.2 F (36.8 C) 98.3 F (36.8 C)  SpO2: 98% 98%   Skin clean, dry and intact without evidence of skin break down, no evidence of skin tears noted. IV catheter discontinued intact. Site without signs and symptoms of complications - no redness or edema noted at insertion site, patient denies  c/o pain - only slight tenderness at site.  Dressing with slight pressure applied.  D/c Instructions-Education: Discharge instructions given to patient/family with verbalized understanding. D/c education completed with patient/family including follow up instructions, medication list, d/c activities limitations if indicated, with other d/c instructions as indicated by MD - patient able to verbalize understanding, all questions fully answered. Patient instructed to return to ED, call 911, or call MD for any changes in condition.  Patient escorted via [place], and D/C home via private auto.  Erasmo Leventhal, RN 07/02/2019 6:58 PM

## 2019-07-02 NOTE — Progress Notes (Signed)
Patient ID: [name], male   DOB: [birth year], 86 y.o.   MRN: [id number]  This NP visited patient at the bedside as a follow up to  yesterday's [place] and to meet with family and hospice liaison/Jennifer Woody as scheduled at 1200 noon.   Patient sitting on side of bed, wife and daughter present.  Patient and his wife tell me they have made decision to discharge home with home health services. Discussed with TOC team this morning. They anticipate discharge today.  Wife tells me they are waiting on therapy sessions with PT before discharging home. Patient will continue to have support form community based [facility].  Ongoing conversation and education offered  regarding patient's current medical situation. Patient and family verbalize understanding of the seriousness of his current medical situation and long term poor prognosis and high risk for decompensation 2/2 to not only ischemic limb but COPD, CHF and fraility  Education offered regarding limitations of medical interventions to prolong quality of life when a body fails to thrive  "I just want to go home and see how I do for 2-3 weeks and make decisions from there"     Family supportive of decision.  Education regarding hospice benefit offered.  Questions and concerns addressed  Discussed with patient the importance of continued conversation with his family and the  medical providers regarding overall plan of care and treatment options,  ensuring decisions are within the context of the patients values and GOCs.  Questions and concerns addressed   Family encouraged to call with questions or concerns.  Total time spent on the unit was 35 minutes  Greater than 50% of the time was spent in counseling and coordination of care  Wadie Lessen NP  Palliative Medicine Team Team Phone # [phone] Pager [phone]

## 2019-07-02 NOTE — Progress Notes (Addendum)
PROGRESS NOTE  [name] FXT:[id number] DOB: Jan 13, 1929 DOA: 06/27/2019 PCP: Travis Cruel, MD  HPI/Recap of past 24 hours: As per H and P done by Dr. Cleaster [name]. [name] Chars [name] a 86 y.o.malewith medical history significant forCOPD/chronic respiratory failure with hypoxia on 2 L supplemental O2 via [name], CAD s/p CABG, chronic diastolic CHF, PVD with right SFA occlusion, PAF not on oral anticoagulation, hypertension, hyperlipidemia, BPH, chronic depression/anxiety, and hearing impairment who presents to the ED for evaluation of right foot Travis.  Has had a chronic nonhealing right foot Travis related to his severe peripheral vascular disease. He underwent Travis debridement on 06/23/2019. Afterwards he has noticed clear drainage from debridement site as well as increased erythema and some desquamation of dorsal foot skin. He has had increased pain at night and with direct stimulation around the Travis site.  Denies subjective fevers, chills, diaphoresis. He reports chronic intermittent chest pain and dyspnea related to his COPD which is not significantly changed. He reports a history of difficulty with urine output.  Seen by vascular surgery.  Post ultrasound-guided cannulation of left brachial artery, aortogram with bilateral lower extremity runoff done on 06/30/2019 by vascular surgery [name].  Revealed severe peripheral vascular disease with multilevel arterial occlusive disease.  Per vascular surgery, he would be at very high risk for any surgery given his age and multiple medical comorbidities including severe COPD, coronary artery disease, and congestive heart failure.    07/02/19: Seen this AM with spouse at bedside. Worsening B/L LE edema. PTA home lasix restarted. Reports spasms in his legs keeping him from sleeping at night.  Flexeril added PRN.    Assessment/Plan: Principal Problem:   Cellulitis of right lower extremity Active Problems:   Hyperlipidemia   Essential  hypertension   Chronic respiratory failure with hypoxia and hypercapnia ([name])   Coronary artery disease involving coronary bypass graft of native heart with angina pectoris ([name])   Peripheral vascular disease ([name])   BPH (benign prostatic hyperplasia)   GERD (gastroesophageal reflux disease)   Anxiety   Chronic diastolic CHF (congestive heart failure) ([name])   COPD (chronic obstructive pulmonary disease) ([name])   Preoperative cardiovascular examination   AKI (acute kidney injury) ([name])   Cellulitis of left lower extremity   Pressure injury of skin   DNR (do not resuscitate)   Voiding difficulty   Travis care by [name]   Severe PVD with total occlusion of right SFA with chronic nonhealing right foot Travis and skin changes involving the right lower extremity complicated by cellulitis -Patient is well-known to Dr. Deitra [name] -Post ultrasound-guided cannulation of left brachial artery, aortogram with bilateral lower extremity runoff done on 06/30/2019 by vascular surgery [name].  Revealed severe peripheral vascular disease with multilevel arterial occlusive disease.  Per vascular surgery, he would be at very high risk for any surgery given his age and multiple medical comorbidities including severe COPD, coronary artery disease, and congestive heart failure. -[name] has been consulted for local Travis care.  We will continue to cover empirically with IV antibiotics.  We will continue IV fluid hydration in the setting of recent IV contrast for the next 24 hours.  Critical limb ischemia/with concern for cellulitis, possible early wet gangrene Patient does not wish to proceed with surgery Will follow up with vasc surgery in 2-3 weeks Continue empiric IV antibiotics, rocephin  MRSA screening negative Continue local Travis care Continue pain control as needed Added flexeril PRN for spasms Blood cultures negative  to date Superficial Travis culture negative to  date Continue to follow cultures  Resolving AKI: Baseline creatinine appears to be 0.9 with GFR greater than 60 Presented with creatinine of 1.44 with GFR 43 Received IV fluid hydration to avoid contrast-induced nephrotoxicity Continue to avoid nephrotoxins and hypotension Monitor urine output Repeat BMP in the morning  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 -Continue supplemental oxygen to maintain O2 saturation greater than 88-90% -Continue home bronchodilators -Patient uses trilogy at homenightly, continue  Chronic diastolic CHF: -Patient has chronic bilateral lower extremity edema -Continue strict I's and O's and daily weight  CAD s/p CABG Hyperlipidemia: Denies any anginal symptoms at the time of this visit. -Continue aspirin and statin  Paroxysmal atrial fibrillation: In sinus rhythm.  Not on anticoagulation.  Rate controlled on Toprol Continue to monitor   GERD: Stable Continue Pepcid.  Chronic anxiety Continue prior to admission Xanax as needed.  Coccyx, pressure Travis stage 1, POA Continue local Travis care  Goals of care [name] has been consulted and following CODE STATUS changed to DNR on 07/01/2019. Meeting with [name] planned 07/02/2019.      DVT prophylaxis: Subcutaneous heparin 3 times daily Code Status: DNR Family Communication: Wife at bedside   Consultants:   Vascular surgery ([name])  Cardiology  Travis  medicine [name]  Procedures:  Post ultrasound-guided cannulation of left brachial artery, aortogram with bilateral lower extremity runoff done on 06/30/2019 by vascular surgery [name].  Antimicrobials:   IV Rocephin      Status is: Inpatient    Dispo: The patient is from: Home.              Anticipated d/c is to: Home with home health services PT OT RN.              Anticipated d/c date is: When Travis medicine [name] signs off.               Patient currently not appropriate for discharge due to ongoing treatment for right lower extremity cellulitis.      Objective: Vitals:   07/02/19 0038 07/02/19 0617 07/02/19 1040 07/02/19 1237  BP: 100/65 126/67 138/79 130/71  Pulse: 77 84 98 96  Resp: 18 18  16   Temp: 98.2 F (36.8 C) 97.6 F (36.4 C)  98.2 F (36.8 C)  TempSrc: Oral Axillary  Oral  SpO2: 100% 98%  98%  Weight:  64.3 kg    Height:        Intake/Output Summary (Last 24 hours) at 07/02/2019 1522 Last data filed at 07/02/2019 0093 Gross per 24 hour  Intake --  Output 100 ml  Net -100 ml   Filed Weights   06/28/19 0500 07/01/19 0542 07/02/19 0617  Weight: 62.9 kg 64 kg 64.3 kg    Exam:  . General: 86 y.o. year-old male well-developed well-nourished no acute distress.  Alert oriented x3.  Very hard of hearing.   . Cardiovascular: Regular rate and rhythm no rubs or gallops.  Mid chest scar from previous cardiac surgery. Marland Kitchen Respiratory: Clear  to auscultation no wheezing rales abdomen: Soft nontender nondistended with normal bowel sounds x4 quadrants. . Musculoskeletal: 2+ pitting edema lower extremity bilaterally.  Unable to palpate dorsalis pedis pulses.  Right lower extremity edema and erythema.  Travis present laterally. Marland Kitchen Psychiatry: Mood is appropriate for condition and setting   Data Reviewed: CBC: Recent Labs  Lab 06/27/19 1422 06/28/19 0231 06/30/19 1314 07/01/19 0604 07/02/19 0438  WBC 10.2 9.7 10.1 8.6 7.4  NEUTROABS 8.2*  --  7.8* 6.7  5.8  HGB 12.6* 11.0* 11.7* 10.9* 10.4*  HCT 39.1 34.7* 35.8* 33.4* 31.7*  MCV 99.7 99.7 100.0 99.1 98.8  PLT 206 182 193 167 188   Basic Metabolic Panel: Recent Labs  Lab 06/27/19 1422 06/28/19 0231 06/30/19 1314 07/01/19 0604 07/02/19 0438  NA 140 139 139 139 136  K 3.6 4.1 3.9 4.3 4.0  CL 96* 97* 101 101 99  CO2 33* 33* 29 28 28   GLUCOSE 139* 85 93 86 81  BUN 26* 23 17 15 15   CREATININE 1.44* 1.14 0.96 1.07 0.93  CALCIUM 9.5 9.2 8.7* 8.8* 8.7*  MG  --   --  2.0  --   --   PHOS  --   --  2.6  --   --    GFR: Estimated Creatinine Clearance: 50.9 mL/min (by C-G formula based on SCr of 0.93 mg/dL). Liver Function Tests: Recent Labs  Lab 06/27/19 1422  AST 22  ALT 18  ALKPHOS 59  BILITOT 0.8  PROT 5.9*  ALBUMIN 3.8   No results for input(s): LIPASE, AMYLASE in the last 168 hours. No results for input(s): AMMONIA in the last 168 hours. Coagulation Profile: Recent Labs  Lab 06/27/19 1422  INR 1.0   Cardiac Enzymes: No results for input(s): CKTOTAL, CKMB, CKMBINDEX, TROPONINI in the last 168 hours. BNP (last 3 results) Recent Labs    07/10/18 1604  PROBNP 648*   HbA1C: No results for input(s): HGBA1C in the last 72 hours. CBG: No results for input(s): GLUCAP in the last 168 hours. Lipid Profile: No results for input(s): CHOL, HDL, LDLCALC, TRIG, CHOLHDL, LDLDIRECT in the last 72 hours. Thyroid Function Tests: No results for input(s): TSH, T4TOTAL, FREET4, T3FREE, THYROIDAB in the last 72 hours. Anemia Panel: No results for input(s): VITAMINB12, FOLATE, FERRITIN, TIBC, IRON, RETICCTPCT in the last 72 hours. Urine analysis:    Component Value Date/Time   COLORURINE YELLOW 06/27/2019 1546   APPEARANCEUR CLEAR 06/27/2019 1546   LABSPEC 1.009 06/27/2019 1546   PHURINE 5.0 06/27/2019 1546   GLUCOSEU NEGATIVE 06/27/2019 1546   HGBUR LARGE (A) 06/27/2019 1546   BILIRUBINUR NEGATIVE 06/27/2019 1546   KETONESUR NEGATIVE 06/27/2019 1546   PROTEINUR NEGATIVE  06/27/2019 1546   NITRITE NEGATIVE 06/27/2019 1546   LEUKOCYTESUR NEGATIVE 06/27/2019 1546   Sepsis Labs: @LABRCNTIP (procalcitonin:4,lacticidven:4)  ) Recent Results (from the past 240 hour(s))  Culture, blood (Routine x 2)     Status: None   Collection Time: 06/27/19  1:45 PM   Specimen: BLOOD  Result Value Ref Range Status   Specimen Description BLOOD SITE NOT SPECIFIED  Final   Special Requests   Final    BOTTLES DRAWN AEROBIC AND ANAEROBIC Blood Culture adequate volume   Culture   Final    NO GROWTH 5 DAYS Performed at [facility], [place] [address], [place], [place] [address]    Report Status 07/02/2019 FINAL  Final  Culture, blood (Routine x 2)     Status: None  Collection Time: 06/27/19  4:56 PM   Specimen: BLOOD  Result Value Ref Range Status   Specimen Description BLOOD BLOOD LEFT FOREARM  Final   Special Requests   Final    BOTTLES DRAWN AEROBIC AND ANAEROBIC Blood Culture results may not be optimal due to an excessive volume of blood received in culture bottles   Culture   Final    NO GROWTH 5 DAYS Performed at [facility], [place] [address] [place], [place] [address]    Report Status 07/02/2019 FINAL  Final  SARS Coronavirus 2 by RT PCR (hospital order, performed in [facility] hospital lab) Nasopharyngeal Nasopharyngeal Swab     Status: None   Collection Time: 06/27/19  7:01 PM   Specimen: Nasopharyngeal Swab  Result Value Ref Range Status   SARS Coronavirus 2 NEGATIVE NEGATIVE Final    Comment: (NOTE) SARS-CoV-2 target nucleic acids are NOT DETECTED.  The SARS-CoV-2 RNA is generally detectable in upper and lower respiratory specimens during the acute phase of infection. The lowest concentration of SARS-CoV-2 viral copies this assay can detect is 250 copies / mL. A negative result does not preclude SARS-CoV-2 infection and should not be used as the sole basis for treatment or other patient management decisions.  A negative result may occur  with improper specimen collection / handling, submission of specimen other than nasopharyngeal swab, presence of viral mutation(s) within the areas targeted by this assay, and inadequate number of viral copies (<250 copies / mL). A negative result must be combined with clinical observations, patient history, and epidemiological information.  Fact Sheet for Patients:   StrictlyIdeas.no  Fact Sheet for Healthcare Providers: BankingDealers.co.za  This test is not yet approved or  cleared by the [place] FDA and has been authorized for detection and/or diagnosis of SARS-CoV-2 by FDA under an Emergency Use Authorization (EUA).  This EUA will remain in effect (meaning this test can be used) for the duration of the COVID-19 declaration under Section 564(b)(1) of the Act, 21 U.S.C. section 360bbb-3(b)(1), unless the authorization is terminated or revoked sooner.  Performed at [facility], [place] [address] [place], [place] [address]   MRSA PCR Screening     Status: None   Collection Time: 06/30/19  3:47 PM   Specimen: Nasal Mucosa; Nasopharyngeal  Result Value Ref Range Status   MRSA by PCR NEGATIVE NEGATIVE Final    Comment:        The GeneXpert MRSA Assay (FDA approved for NASAL specimens only), is one component of a comprehensive MRSA colonization surveillance program. It is not intended to diagnose MRSA infection nor to guide or monitor treatment for MRSA infections. Performed at [facility], [place] [address] [place], [place] [address]   Aerobic Culture (superficial specimen)     Status: None (Preliminary result)   Collection Time: 07/01/19 10:33 PM   Specimen: Travis  Result Value Ref Range Status   Specimen Description Travis RIGHT FOOT  Final   Special Requests NONE  Final   Gram Stain   Final    RARE WBC PRESENT, PREDOMINANTLY PMN NO ORGANISMS SEEN Performed at [facility], [place] [address],  [place],  [address]    Culture PENDING  Incomplete   Report Status PENDING  Incomplete      Studies: US RENAL  Result Date: 07/02/2019 CLINICAL DATA:  Difficulty voiding, history of ureteral tumor EXAM: RENAL / URINARY TRACT ULTRASOUND COMPLETE COMPARISON:  12/03/2017 CT FINDINGS: Right Kidney: Renal  measurements: 9.4 x 4.2 x 4.9 cm. = volume: 102 mL. 15 mm lower pole renal calculus is noted without obstructive change. Left Kidney: Renal measurements: 10.3 x 5.8 x 5.1 cm. = volume: 159 mL. Echogenicity within normal limits. No mass or hydronephrosis visualized. Bladder: Bladder is partially distended. Prostate is enlarged indenting upon the inferior aspect of the bladder. Other: None. IMPRESSION: Prominent prostate. Lower pole right renal calculus without obstructive change. This is stable from the prior exam Electronically Signed   By: [name] M.D.   On: 07/02/2019 11:35    Scheduled Meds: . aspirin EC  81 mg Oral Daily  . atorvastatin  20 mg Oral Daily  . cholecalciferol  2,000 Units Oral Q lunch  . famotidine  20 mg Oral Daily  . feeding supplement (KATE FARMS STANDARD 1.4)  325 mL Oral BID BM  . finasteride  5 mg Oral q morning - 10a  . furosemide  20 mg Oral BID  . heparin  5,000 Units Subcutaneous Q8H  . metoprolol succinate  12.5 mg Oral Daily  . multivitamin  1 tablet Oral Daily  . predniSONE  10 mg Oral Q breakfast  . saccharomyces boulardii  250 mg Oral BID  . [START ON 07/03/2019] silodosin  4 mg Oral Q breakfast  . sodium chloride flush  3 mL Intravenous Q12H  . umeclidinium bromide  1 puff Inhalation Daily  . vitamin B-12  1,000 mcg Oral Daily    Continuous Infusions: . sodium chloride    . cefTRIAXone (ROCEPHIN)  IV 1 g (07/01/19 2223)     LOS: 4 days     Kayleen Memos, MD [facility] Pager [phone]  If 7PM-7AM, please contact night-coverage [email or web address] Password [facility] 07/02/2019, 3:22 PM

## 2019-07-02 NOTE — Progress Notes (Signed)
Physical Therapy Treatment Patient Details Name: [name] MRN: [id number] DOB: August 17, 1929 Today's Date: 07/02/2019    History of Present Illness Pt is an 33 s/p male secondary to R LE cellulitis. PMH including but not limited to COPD/chronic respiratory failure with hypoxia on 2 L supplemental O2 via [place], CAD s/p CABG, chronic diastolic CHF, PVD with right SFA occlusion, PAF, hypertension, hyperlipidemia, BPH, depression/anxiety, and hard of hearing.     PT Comments    Pt very limited overall secondary to fatigue. He reported that he had very recently just ambulated to and from the bathroom. His wife and daughter were present throughout, encouraging him with activity. He was agreeable to ambulate in his room, needing min guard and a RW for support. Pt SOB after ambulation but with SpO2 at 95%. Pt would continue to benefit from skilled physical therapy services at this time while admitted and after d/c to address the below listed limitations in order to improve overall safety and independence with functional mobility.    Follow Up Recommendations  Home health PT;Supervision for mobility/OOB     Equipment Recommendations  None recommended by PT    Recommendations for Other Services       Precautions / Restrictions Precautions Precautions: Fall Restrictions Weight Bearing Restrictions: No    Mobility  Bed Mobility               General bed mobility comments: pt OOB in recliner chair upon arrival  Transfers Overall transfer level: Needs assistance Equipment used: Rolling walker (2 wheeled) Transfers: Sit to/from Stand Sit to Stand: Supervision         General transfer comment: supervision for safety, good technique and steady with transitions  Ambulation/Gait Ambulation/Gait assistance: Min guard Gait Distance (Feet): 30 Feet Assistive device: Rolling walker (2 wheeled) Gait Pattern/deviations: Step-through pattern;Decreased stride length Gait velocity:  decreased   General Gait Details: pt with slow, steady gait; very limited secondary to fatigue and increased SOB   Stairs             Wheelchair Mobility    Modified Rankin (Stroke Patients Only)       Balance Overall balance assessment: Needs assistance Sitting-balance support: No upper extremity supported;Feet supported Sitting balance-Leahy Scale: Good     Standing balance support: During functional activity Standing balance-Leahy Scale: Fair                              Cognition Arousal/Alertness: Awake/alert Behavior During Therapy: WFL for tasks assessed/performed Overall Cognitive Status: Within Functional Limits for tasks assessed                                        Exercises      General Comments        Pertinent Vitals/Pain Pain Assessment: Faces Faces Pain Scale: Hurts a little bit Pain Location: R foot Pain Descriptors / Indicators: Aching Pain Intervention(s): Monitored during session    Home Living                      Prior Function            PT Goals (current goals can now be found in the care plan section) Acute Rehab PT Goals PT Goal Formulation: With patient/family Time For Goal Achievement: 07/12/19 Potential to Achieve Goals: Good Progress  towards PT goals: Progressing toward goals    Frequency    Min 3X/week      PT Plan Current plan remains appropriate    Co-evaluation              AM-PAC PT "6 Clicks" Mobility   Outcome Measure  Help needed turning from your back to your side while in a flat bed without using bedrails?: None Help needed moving from lying on your back to sitting on the side of a flat bed without using bedrails?: None Help needed moving to and from a bed to a chair (including a wheelchair)?: None Help needed standing up from a chair using your arms (e.g., wheelchair or bedside chair)?: None Help needed to walk in hospital room?: A Little Help  needed climbing 3-5 steps with a railing? : A Little 6 Click Score: 22    End of Session Equipment Utilized During Treatment: Oxygen;Gait belt Activity Tolerance: Patient limited by fatigue Patient left: in chair;with call bell/phone within reach;with family/visitor present Nurse Communication: Mobility status PT Visit Diagnosis: Other abnormalities of gait and mobility (R26.89);Pain Pain - Right/Left: Right Pain - part of body: Ankle and joints of foot     Time: 9390-3009 PT Time Calculation (min) (ACUTE ONLY): 12 min  Charges:  $Gait Training: 8-22 mins                     Anastasio Champion, DPT  Acute Rehabilitation Services Pager [phone] Office [place] 07/02/2019, 3:13 PM

## 2019-07-02 NOTE — Plan of Care (Signed)
  Problem: Activity: Goal: Risk for activity intolerance will decrease Outcome: Progressing   Problem: Nutrition: Goal: Adequate nutrition will be maintained Outcome: Progressing   Problem: Elimination: Goal: Will not experience complications related to bowel motility Outcome: Progressing   

## 2019-07-03 ENCOUNTER — Telehealth: Payer: Self-pay | Admitting: *Deleted

## 2019-07-03 ENCOUNTER — Telehealth: Payer: Self-pay | Admitting: Interventional Cardiology

## 2019-07-03 NOTE — Telephone Encounter (Signed)
Patient's wife states that her husband was hospitalized from 06/18-06/23. There, they made some medication changes and since then he has began deteriorating. She would like to go over his medication list. Please advise.

## 2019-07-03 NOTE — Telephone Encounter (Signed)
Spoke with the patient and his wife. The patient was discharged from the hospital last night and wanted to make Dr. Tamala Julian of some changes that were made. The patient had an angiogram done and it was found that blood flow to his right leg was blocked. Right AKA was recommended but have not decided on whether he will go through with it. Patient is now at home and will have home health coming out. Patient also states that he saw his urologist this morning and he increased his silodosin to 8 mg daily. Patient is concerned about his blood pressure with this increased dose. He will monitor. He also states that his spironolactone was discontinued at discharge and metoprolol dose was decreased to 12.5 mg daily. He reports and increased blood pressure since decrease in metoprolol. Reports systolic BP in 376E and heart rate has gotten up to 100-102 bpm.  Patient states he just wanted to make Dr. Tamala Julian aware of his current condition.

## 2019-07-03 NOTE — Telephone Encounter (Signed)
Received a call from patient's wife, [name], to let me know that he is back home from the hospital. He has been having issues with urinary retention. She took him to the Urologist yesterday and they increased his Rapaflo from 4mg  to 8mg  to try and keep him from requiring a foley catheter. Also, [facility] is scheduled to come out and visit with them tomorrow. He is to have PT/OT/RN services. She is unsure what to do about his wound care. She is going to contact Dr. Dellia Nims to discuss how to proceed with his dressing changes until the home health nurse can assess. Will continue to monitor.

## 2019-07-04 ENCOUNTER — Other Ambulatory Visit: Payer: Self-pay | Admitting: *Deleted

## 2019-07-04 DIAGNOSIS — H9190 Unspecified hearing loss, unspecified ear: Secondary | ICD-10-CM | POA: Diagnosis not present

## 2019-07-04 DIAGNOSIS — L03116 Cellulitis of left lower limb: Secondary | ICD-10-CM | POA: Diagnosis not present

## 2019-07-04 DIAGNOSIS — F419 Anxiety disorder, unspecified: Secondary | ICD-10-CM | POA: Diagnosis not present

## 2019-07-04 DIAGNOSIS — J449 Chronic obstructive pulmonary disease, unspecified: Secondary | ICD-10-CM | POA: Diagnosis not present

## 2019-07-04 DIAGNOSIS — K219 Gastro-esophageal reflux disease without esophagitis: Secondary | ICD-10-CM | POA: Diagnosis not present

## 2019-07-04 DIAGNOSIS — Z4800 Encounter for change or removal of nonsurgical wound dressing: Secondary | ICD-10-CM | POA: Diagnosis not present

## 2019-07-04 DIAGNOSIS — I7092 Chronic total occlusion of artery of the extremities: Secondary | ICD-10-CM | POA: Diagnosis not present

## 2019-07-04 DIAGNOSIS — I5032 Chronic diastolic (congestive) heart failure: Secondary | ICD-10-CM | POA: Diagnosis not present

## 2019-07-04 DIAGNOSIS — E785 Hyperlipidemia, unspecified: Secondary | ICD-10-CM | POA: Diagnosis not present

## 2019-07-04 DIAGNOSIS — N400 Enlarged prostate without lower urinary tract symptoms: Secondary | ICD-10-CM | POA: Diagnosis not present

## 2019-07-04 DIAGNOSIS — Z951 Presence of aortocoronary bypass graft: Secondary | ICD-10-CM | POA: Diagnosis not present

## 2019-07-04 DIAGNOSIS — J9612 Chronic respiratory failure with hypercapnia: Secondary | ICD-10-CM | POA: Diagnosis not present

## 2019-07-04 DIAGNOSIS — Z87442 Personal history of urinary calculi: Secondary | ICD-10-CM | POA: Diagnosis not present

## 2019-07-04 DIAGNOSIS — J9611 Chronic respiratory failure with hypoxia: Secondary | ICD-10-CM | POA: Diagnosis not present

## 2019-07-04 DIAGNOSIS — J310 Chronic rhinitis: Secondary | ICD-10-CM | POA: Diagnosis not present

## 2019-07-04 DIAGNOSIS — F329 Major depressive disorder, single episode, unspecified: Secondary | ICD-10-CM | POA: Diagnosis not present

## 2019-07-04 DIAGNOSIS — M1990 Unspecified osteoarthritis, unspecified site: Secondary | ICD-10-CM | POA: Diagnosis not present

## 2019-07-04 DIAGNOSIS — N200 Calculus of kidney: Secondary | ICD-10-CM | POA: Diagnosis not present

## 2019-07-04 DIAGNOSIS — I480 Paroxysmal atrial fibrillation: Secondary | ICD-10-CM | POA: Diagnosis not present

## 2019-07-04 DIAGNOSIS — I25709 Atherosclerosis of coronary artery bypass graft(s), unspecified, with unspecified angina pectoris: Secondary | ICD-10-CM | POA: Diagnosis not present

## 2019-07-04 DIAGNOSIS — L03115 Cellulitis of right lower limb: Secondary | ICD-10-CM | POA: Diagnosis not present

## 2019-07-04 DIAGNOSIS — I110 Hypertensive heart disease with heart failure: Secondary | ICD-10-CM | POA: Diagnosis not present

## 2019-07-04 DIAGNOSIS — L97519 Non-pressure chronic ulcer of other part of right foot with unspecified severity: Secondary | ICD-10-CM | POA: Diagnosis not present

## 2019-07-04 DIAGNOSIS — I70235 Atherosclerosis of native arteries of right leg with ulceration of other part of foot: Secondary | ICD-10-CM | POA: Diagnosis not present

## 2019-07-04 DIAGNOSIS — R350 Frequency of micturition: Secondary | ICD-10-CM | POA: Diagnosis not present

## 2019-07-04 DIAGNOSIS — I255 Ischemic cardiomyopathy: Secondary | ICD-10-CM | POA: Diagnosis not present

## 2019-07-04 NOTE — Patient Outreach (Signed)
[facility]) Care Management  07/04/2019  JOVANN LUSE 1929-05-24 [id number]   Notified by [facility] that contact had been made with member/wife in attempt to engage them on program.  Plan to schedule visit for next week if member has not enrolled in hospice. Call placed to member's wife to follow up on discharge from [place] she has been overwhelmed with trying to make a decision for/with member. Report they were told his options were to either have an AKA, after which the likelihood of him returning home would be slim due to safety concerns, or to do nothing and enroll in hospice.  She inquired about roll of [facility], explained that they work in collaboration with his assigned providers in effort to provide additional support in the home, especially if it is difficult to get member out to appointments.  She was under the impression that they would be able to offer additional options to treat member.  At this time, she does not feel proceeding with their program would be beneficial to them.  They are currently in the urology office, member is having a foley catheter placed for urinary retention.  She and member will take the new few days to make decision on the plan of care.  This care manager will follow up with them within the next week.  Valente Aleksi, [place], MSN [place] [phone]

## 2019-07-04 NOTE — Telephone Encounter (Signed)
Spoke with wife and made her aware of recommendations from Dr. Tamala Julian.  Wife will discuss with pt.  She was appreciative for information.

## 2019-07-04 NOTE — Telephone Encounter (Signed)
Thanks for the information.  We will leave medications exactly as prescribed when discharged from the hospital.  If amputation was recommended, it would be to prevent blood poisoning from gangrene.  He should consider proceeding with amputation.

## 2019-07-05 LAB — AEROBIC CULTURE W GRAM STAIN (SUPERFICIAL SPECIMEN)

## 2019-07-07 DIAGNOSIS — I7092 Chronic total occlusion of artery of the extremities: Secondary | ICD-10-CM | POA: Diagnosis not present

## 2019-07-07 DIAGNOSIS — Z4800 Encounter for change or removal of nonsurgical wound dressing: Secondary | ICD-10-CM | POA: Diagnosis not present

## 2019-07-07 DIAGNOSIS — L03116 Cellulitis of left lower limb: Secondary | ICD-10-CM | POA: Diagnosis not present

## 2019-07-07 DIAGNOSIS — I70235 Atherosclerosis of native arteries of right leg with ulceration of other part of foot: Secondary | ICD-10-CM | POA: Diagnosis not present

## 2019-07-07 DIAGNOSIS — L03115 Cellulitis of right lower limb: Secondary | ICD-10-CM | POA: Diagnosis not present

## 2019-07-07 DIAGNOSIS — L97519 Non-pressure chronic ulcer of other part of right foot with unspecified severity: Secondary | ICD-10-CM | POA: Diagnosis not present

## 2019-07-08 ENCOUNTER — Encounter (HOSPITAL_BASED_OUTPATIENT_CLINIC_OR_DEPARTMENT_OTHER): Payer: Medicare Other | Admitting: Internal Medicine

## 2019-07-08 ENCOUNTER — Telehealth: Payer: Self-pay | Admitting: Internal Medicine

## 2019-07-08 DIAGNOSIS — S51002A Unspecified open wound of left elbow, initial encounter: Secondary | ICD-10-CM | POA: Diagnosis not present

## 2019-07-08 DIAGNOSIS — I87323 Chronic venous hypertension (idiopathic) with inflammation of bilateral lower extremity: Secondary | ICD-10-CM | POA: Diagnosis not present

## 2019-07-08 DIAGNOSIS — L97511 Non-pressure chronic ulcer of other part of right foot limited to breakdown of skin: Secondary | ICD-10-CM | POA: Diagnosis not present

## 2019-07-08 DIAGNOSIS — S51801A Unspecified open wound of right forearm, initial encounter: Secondary | ICD-10-CM | POA: Diagnosis not present

## 2019-07-08 DIAGNOSIS — Z9981 Dependence on supplemental oxygen: Secondary | ICD-10-CM | POA: Diagnosis not present

## 2019-07-08 DIAGNOSIS — I110 Hypertensive heart disease with heart failure: Secondary | ICD-10-CM | POA: Diagnosis not present

## 2019-07-08 DIAGNOSIS — M1990 Unspecified osteoarthritis, unspecified site: Secondary | ICD-10-CM | POA: Diagnosis not present

## 2019-07-08 DIAGNOSIS — J449 Chronic obstructive pulmonary disease, unspecified: Secondary | ICD-10-CM | POA: Diagnosis not present

## 2019-07-08 DIAGNOSIS — I70235 Atherosclerosis of native arteries of right leg with ulceration of other part of foot: Secondary | ICD-10-CM | POA: Diagnosis not present

## 2019-07-08 DIAGNOSIS — I2510 Atherosclerotic heart disease of native coronary artery without angina pectoris: Secondary | ICD-10-CM | POA: Diagnosis not present

## 2019-07-08 DIAGNOSIS — I5040 Unspecified combined systolic (congestive) and diastolic (congestive) heart failure: Secondary | ICD-10-CM | POA: Diagnosis not present

## 2019-07-08 DIAGNOSIS — L97519 Non-pressure chronic ulcer of other part of right foot with unspecified severity: Secondary | ICD-10-CM | POA: Diagnosis present

## 2019-07-08 DIAGNOSIS — X58XXXA Exposure to other specified factors, initial encounter: Secondary | ICD-10-CM | POA: Diagnosis not present

## 2019-07-08 NOTE — Telephone Encounter (Signed)
Dr. [name],  Please see phone note and advise.

## 2019-07-08 NOTE — Telephone Encounter (Signed)
If his legs are swollen ok to take an extra lasix daily until then are better and stay of vent during the day if still can't get comfortable   Keep appt  6/30

## 2019-07-08 NOTE — Telephone Encounter (Signed)
[name] wife is returning phone call. [name] phone number is [phone].

## 2019-07-08 NOTE — Telephone Encounter (Signed)
Spoke with pt's wife, aware of recs.  Nothing further needed at this time- will close encounter.

## 2019-07-08 NOTE — Telephone Encounter (Signed)
lmtcb for pt's wife 

## 2019-07-08 NOTE — Telephone Encounter (Signed)
Spoke with patient and his wife, he was hospitalized from June 18-23rd.  He is having increase sob with exertion and first thing in the morning.  He is having to use his Xopenex nebulizer first thing in the morning prior to using his Symbicort, which is new for him.  Had to use his Xopenex 4 times on yesterday.  He is on 2L, sats are wnl.  He has an increase in mucus production and post nasal drip.  He has a productive cough with yellow mucus.  Denies any fever.  He usually weighs 134 lbs and is now 137.6 pounds and has edema in BLE.  I have scheduled him for a virtual visit with you on 6/30, no in office visits available.  Please advise if you would like to prescribe anything prior to his visit.

## 2019-07-09 ENCOUNTER — Other Ambulatory Visit: Payer: Self-pay

## 2019-07-09 ENCOUNTER — Ambulatory Visit (INDEPENDENT_AMBULATORY_CARE_PROVIDER_SITE_OTHER): Payer: Medicare Other | Admitting: Primary Care

## 2019-07-09 ENCOUNTER — Encounter: Payer: Self-pay | Admitting: Primary Care

## 2019-07-09 DIAGNOSIS — J441 Chronic obstructive pulmonary disease with (acute) exacerbation: Secondary | ICD-10-CM | POA: Diagnosis not present

## 2019-07-09 DIAGNOSIS — L97519 Non-pressure chronic ulcer of other part of right foot with unspecified severity: Secondary | ICD-10-CM | POA: Diagnosis not present

## 2019-07-09 DIAGNOSIS — L03116 Cellulitis of left lower limb: Secondary | ICD-10-CM | POA: Diagnosis not present

## 2019-07-09 DIAGNOSIS — I70235 Atherosclerosis of native arteries of right leg with ulceration of other part of foot: Secondary | ICD-10-CM | POA: Diagnosis not present

## 2019-07-09 DIAGNOSIS — Z4800 Encounter for change or removal of nonsurgical wound dressing: Secondary | ICD-10-CM | POA: Diagnosis not present

## 2019-07-09 DIAGNOSIS — L03115 Cellulitis of right lower limb: Secondary | ICD-10-CM | POA: Diagnosis not present

## 2019-07-09 DIAGNOSIS — I7092 Chronic total occlusion of artery of the extremities: Secondary | ICD-10-CM | POA: Diagnosis not present

## 2019-07-09 MED ORDER — AZITHROMYCIN 250 MG PO TABS: ORAL_TABLET | ORAL | 0 refills | Status: DC

## 2019-07-09 NOTE — Progress Notes (Signed)
[name], [name] ([id number]) Visit Report for 07/08/2019 Arrival Information Details Patient Name: Date of Service: DO RN, [place] V ID L. 07/08/2019 1:00 PM Medical Record Number: [id number] Patient Account Number: [id number] Date of Birth/Sex: Treating RN: [name] (86 y.o. [name] Primary Care Daniqua Campoy: Piedad Climes, CHA RLES Other Clinician: Referring Mikel Pyon: Treating Ajiah Mcglinn/Extender: Mare Ferrari, CHA RLES Weeks in Treatment: 11 Visit Information History Since Last Visit Added or deleted any medications: No Patient Arrived: Wheel Chair Any new allergies or adverse reactions: No Arrival Time: 13:36 Had a fall or experienced change in No Accompanied By: wife activities of daily living that may affect Transfer Assistance: None risk of falls: Patient Identification Verified: Yes Signs or symptoms of abuse/neglect since last visito No Secondary Verification Process Completed: Yes Hospitalized since last visit: No Patient Requires Transmission-Based Precautions: No Implantable device outside of the clinic excluding No Patient Has Alerts: Yes cellular tissue based products placed in the center Patient Alerts: Right ABI: [place], TBI: 0 since last visit: Left ABI: [place], TBI:0 Pain Present Now: No Electronic Signature(s) Signed: 07/09/2019 5:49:00 PM By: Levan Hurst RN, BSN Entered By: Levan Hurst on 07/08/2019 13:36:54 -------------------------------------------------------------------------------- Compression Therapy Details Patient Name: Date of Service: DO RN, [name] 07/08/2019 1:00 PM Medical Record Number: [id number] Patient Account Number: [id number] Date of Birth/Sex: Treating RN: [birth year] (86 y.o. [name]) [name] Primary Care Mykayla Brinton: Piedad Climes, CHA RLES Other Clinician: Referring Niko Penson: Treating Kylil Swopes/Extender: Mare Ferrari, CHA RLES Weeks in Treatment: 11 Compression Therapy Performed for Wound Assessment: Wound #6 Right,Distal,Lateral  Foot Performed By: Clinician [name], RN Compression Type: Double Layer Post Procedure Diagnosis Same as Pre-procedure Electronic Signature(s) Signed: 07/08/2019 5:22:48 PM By: [name] RN Entered By: [name] on 07/08/2019 14:33:05 -------------------------------------------------------------------------------- Encounter Discharge Information Details Patient Name: Date of Service: DO RN, [name] 07/08/2019 1:00 PM Medical Record Number: [id number] Patient Account Number: [id number] Date of Birth/Sex: Treating RN: [birth year] (86 y.o. [name] Primary Care Zyion Doxtater: Piedad Climes, CHA RLES Other Clinician: Referring Sedric Guia: Treating Damarco Keysor/Extender: Mare Ferrari, CHA RLES Weeks in Treatment: 11 Encounter Discharge Information Items Discharge Condition: Stable Ambulatory Status: Wheelchair Discharge Destination: Home Transportation: Private Auto Accompanied By: wife Schedule Follow-up Appointment: Yes Clinical Summary of Care: Patient Declined Electronic Signature(s) Signed: 07/08/2019 5:23:36 PM By: Kela Millin Entered By: Kela Millin on 07/08/2019 14:59:27 -------------------------------------------------------------------------------- Lower Extremity Assessment Details Patient Name: Date of Service: DO RN, [name] 07/08/2019 1:00 PM Medical Record Number: [id number] Patient Account Number: [id number] Date of Birth/Sex: Treating RN: [name] (86 y.o. [name] Primary Care [name]: Piedad Climes, CHA RLES Other Clinician: Referring Kelley Polinsky: Treating Ladarren Steiner/Extender: Mare Ferrari, CHA RLES Weeks in Treatment: 11 Edema Assessment Assessed: [Left: No] [Right: No] Edema: [Left: Ye] [Right: s] Calf Left: Right: Point of Measurement: cm From Medial Instep cm 35 cm Ankle Left: Right: Point of Measurement: cm From Medial Instep cm 25 cm Vascular Assessment Pulses: Dorsalis Pedis Palpable:  [Right:No] Electronic Signature(s) Signed: 07/09/2019 5:49:00 PM By: Levan Hurst RN, BSN Entered By: Levan Hurst on 07/08/2019 13:49:43 -------------------------------------------------------------------------------- Multi Wound Chart Details Patient Name: Date of Service: DO RN, [name] 07/08/2019 1:00 PM Medical Record Number: [id number] Patient Account Number: [id number] Date of Birth/Sex: Treating RN: [name] (86 y.o. [name] Primary Care Marcus Schwandt: Piedad Climes, CHA RLES Other Clinician: Referring Amisadai Woodford: Treating Kaelob Persky/Extender: Mare Ferrari, CHA RLES Weeks in Treatment: 11 Vital Signs Height(in):  69 Pulse(bpm): 98 Weight(lbs): 132 Blood Pressure(mmHg): 157/81 Body Mass Index(BMI): 19 Temperature(F): 98.4 Respiratory Rate(breaths/min): 20 Photos: [10:No Photos Left Elbow] [2:No Photos Right T Great oe] [4:No Photos Right, Medial T Great oe] Wound Location: [10:Trauma] [2:Gradually Appeared] [4:Gradually Appeared] Wounding Event: [10:Skin T ear] [2:Arterial Insufficiency Ulcer] [4:Arterial Insufficiency Ulcer] Primary Etiology: [10:Cataracts, Asthma, Chronic] [2:Cataracts, Asthma, Chronic] [4:Cataracts, Asthma, Chronic] Comorbid History: [10:Obstructive Pulmonary Disease (COPD), Congestive Heart Failure, Coronary Artery Disease, Hypertension, Peripheral Arterial Disease, Osteoarthritis, Confinement Anxiety 06/21/2019] [2:Obstructive Pulmonary Disease (COPD), Congestive  Heart Failure, Coronary Artery Disease, Hypertension, Peripheral Arterial Disease, Osteoarthritis, Confinement Anxiety 03/31/2019] [4:Obstructive Pulmonary Disease (COPD), Congestive Heart Failure, Coronary Artery Disease, Hypertension, Peripheral  Arterial Disease, Osteoarthritis, Confinement Anxiety 05/13/2019] Date Acquired: [10:2] [2:11] [4:8] Weeks of Treatment: [10:Healed - Epithelialized] [2:Open] [4:Open] Wound Status: [10:0x0x0] [2:0.3x0.5x0.1] [4:2x1.2x0.1] Measurements L x W x D  (cm) [10:0] [2:0.118] [4:1.885] A (cm) : rea [10:0] [2:0.012] [4:0.188] Volume (cm) : [10:100.00%] [2:90.90%] [4:-22.50%] % Reduction in Area: [10:100.00%] [2:90.80%] [4:-22.10%] % Reduction in Volume: [10:Full Thickness Without Exposed] [2:Unclassifiable] [4:Full Thickness Without Exposed] Classification: [10:Support Structures None Present] [2:Small] [4:Support Structures Small] Exudate A mount: [10:N/A] [2:Serosanguineous] [4:Serous] Exudate Type: [10:N/A] [2:red, brown] [4:amber] Exudate Color: [10:Flat and Intact] [2:Flat and Intact] [4:Flat and Intact] Wound Margin: [10:None Present (0%)] [2:None Present (0%)] [4:None Present (0%)] Granulation Amount: [10:None Present (0%)] [2:Large (67-100%)] [4:Large (67-100%)] Necrotic Amount: [10:N/A] [2:Eschar] [4:Eschar, Adherent Slough] Necrotic Tissue: [10:Fascia: No] [2:Fascia: No] [4:Fascia: No] Exposed Structures: [10:Fat Layer (Subcutaneous Tissue) Exposed: No Tendon: No Muscle: No Joint: No Bone: No Large (67-100%)] [2:Fat Layer (Subcutaneous Tissue) Exposed: No Tendon: No Muscle: No Joint: No Bone: No Medium (34-66%)] [4:Fat Layer (Subcutaneous Tissue) Exposed:  No Tendon: No Muscle: No Joint: No Bone: No Small (1-33%)] Wound Number: 6 9 N/A Photos: No Photos No Photos N/A Right, Distal, Lateral Foot Right Forearm N/A Wound Location: Gradually Appeared Trauma N/A Wounding Event: Arterial Insufficiency Ulcer Skin Tear N/A Primary Etiology: Cataracts, Asthma, Chronic Cataracts, Asthma, Chronic N/A Comorbid History: Obstructive Pulmonary Disease Obstructive Pulmonary Disease (COPD), Congestive Heart Failure, (COPD), Congestive Heart Failure, Coronary Artery Disease, Coronary Artery Disease, Hypertension, Peripheral Arterial Hypertension, Peripheral Arterial Disease, Osteoarthritis, Confinement Disease, Osteoarthritis, Confinement Anxiety Anxiety 05/23/2019 06/21/2019 N/A Date Acquired: 6 2 N/A Weeks of Treatment: Open Healed -  Epithelialized N/A Wound Status: 1.4x1x0.2 0x0x0 N/A Measurements L x W x D (cm) 1.1 0 N/A A (cm) : rea 0.22 0 N/A Volume (cm) : -250.30% 100.00% N/A % Reduction in Area: -609.70% 100.00% N/A % Reduction in Volume: Unclassifiable Full Thickness Without Exposed N/A Classification: Support Structures Medium None Present N/A Exudate A mount: Serous N/A N/A Exudate Type: amber N/A N/A Exudate Color: Flat and Intact Flat and Intact N/A Wound Margin: None Present (0%) None Present (0%) N/A Granulation Amount: Large (67-100%) None Present (0%) N/A Necrotic Amount: Adherent Slough N/A N/A Necrotic Tissue: Fascia: No Fascia: No N/A Exposed Structures: Fat Layer (Subcutaneous Tissue) Fat Layer (Subcutaneous Tissue) Exposed: No Exposed: No Tendon: No Tendon: No Muscle: No Muscle: No Joint: No Joint: No Bone: No Bone: No None Large (67-100%) N/A Epithelialization: Treatment Notes Electronic Signature(s) Signed: 07/08/2019 5:22:48 PM By: Yevonne Pax RN Signed: 07/09/2019 3:41:00 PM By: Baltazar Najjar MD Entered By: Baltazar Najjar on 07/08/2019 14:32:00 -------------------------------------------------------------------------------- Multi-Disciplinary Care Plan Details Patient Name: Date of Service: DO RN, [name] 07/08/2019 1:00 PM Medical Record Number: [id number] Patient Account Number: [id number] Date of Birth/Sex: Treating RN: [name] (86 y.o. Judie Petit) Yevonne Pax Primary Care  Obrian Bulson: Piedad Climes, CHA RLES Other Clinician: Referring Mieko Kneebone: Treating Bradlee Bridgers/Extender: Mare Ferrari, CHA RLES Weeks in Treatment: 11 Active Inactive Wound/Skin Impairment Nursing Diagnoses: Impaired tissue integrity Knowledge deficit related to ulceration/compromised skin integrity Goals: Patient/caregiver will verbalize understanding of skin care regimen Date Initiated: 04/21/2019 Target Resolution Date: 07/21/2019 Goal Status: Active Ulcer/skin breakdown will have  a volume reduction of 30% by week 4 Date Initiated: 04/21/2019 Date Inactivated: 05/27/2019 Target Resolution Date: 05/23/2019 Goal Status: Met Ulcer/skin breakdown will have a volume reduction of 50% by week 8 Date Initiated: 05/27/2019 Date Inactivated: 06/23/2019 Target Resolution Date: 06/23/2019 Goal Status: Unmet Unmet Reason: vascular disease Ulcer/skin breakdown will have a volume reduction of 80% by week 12 Date Initiated: 06/23/2019 Target Resolution Date: 07/21/2019 Goal Status: Active Interventions: Assess patient/caregiver ability to obtain necessary supplies Assess patient/caregiver ability to perform ulcer/skin care regimen upon admission and as needed Assess ulceration(s) every visit Provide education on ulcer and skin care Notes: Electronic Signature(s) Signed: 07/08/2019 5:22:48 PM By: [name] RN Entered By: [name] on 07/08/2019 13:54:18 -------------------------------------------------------------------------------- Pain Assessment Details Patient Name: Date of Service: DO RN, [name] 07/08/2019 1:00 PM Medical Record Number: [id number] Patient Account Number: [id number] Date of Birth/Sex: Treating RN: [birth year] (86 y.o. [name] Primary Care Avonelle Viveros: Piedad Climes, CHA RLES Other Clinician: Referring Karlin Binion: Treating Kaitlyne Friedhoff/Extender: Mare Ferrari, CHA RLES Weeks in Treatment: 11 Active Problems Location of Pain Severity and Description of Pain Patient Has Paino No Site Locations Pain Management and Medication Current Pain Management: Electronic Signature(s) Signed: 07/09/2019 5:49:00 PM By: Levan Hurst RN, BSN Entered By: Levan Hurst on 07/08/2019 13:41:11 -------------------------------------------------------------------------------- Patient/Caregiver Education Details Patient Name: Date of Service: DO RN, DA Threasa Alpha 6/29/2021andnbsp1:00 PM Medical Record Number: [id number] Patient Account Number: [id number] Date of  Birth/Gender: Treating RN: [birth year] (86 y.o. [name]) [name] Primary Care Physician: Piedad Climes, CHA RLES Other Clinician: Referring Physician: Treating Physician/Extender: Mare Ferrari, CHA RLES Weeks in Treatment: 11 Education Assessment Education Provided To: Patient Education Topics Provided Wound/Skin Impairment: Methods: Explain/Verbal Responses: State content correctly Electronic Signature(s) Signed: 07/08/2019 5:22:48 PM By: [name] RN Entered By: [name] on 07/08/2019 13:54:33 -------------------------------------------------------------------------------- Wound Assessment Details Patient Name: Date of Service: DO RN, [name] 07/08/2019 1:00 PM Medical Record Number: [id number] Patient Account Number: [id number] Date of Birth/Sex: Treating RN: [birth year] [name] (86 y.o. [name] Primary Care Suzie Vandam: Piedad Climes, CHA RLES Other Clinician: Referring Thelia Tanksley: Treating Yomira Flitton/Extender: Mare Ferrari, CHA RLES Weeks in Treatment: 11 Wound Status Wound Number: 10 Primary Skin T ear Etiology: Wound Location: Left Elbow Wound Healed - Epithelialized Wounding Event: Trauma Status: Date Acquired: 06/21/2019 Comorbid Cataracts, Asthma, Chronic Obstructive Pulmonary Disease Weeks Of Treatment: 2 History: (COPD), Congestive Heart Failure, Coronary Artery Disease, Clustered Wound: No Hypertension, Peripheral Arterial Disease, Osteoarthritis, Confinement Anxiety Wound Measurements Length: (cm) Width: (cm) Depth: (cm) Area: (cm) Volume: (cm) 0 % Reduction in Area: 100% 0 % Reduction in Volume: 100% 0 Epithelialization: Large (67-100%) 0 Tunneling: No 0 Undermining: No Wound Description Classification: Full Thickness Without Exposed Support Structures Wound Margin: Flat and Intact Exudate Amount: None Present Foul Odor After Cleansing: No Slough/Fibrino No Wound Bed Granulation Amount: None Present (0%) Exposed Structure Necrotic  Amount: None Present (0%) Fascia Exposed: No Fat Layer (Subcutaneous Tissue) Exposed: No Tendon Exposed: No Muscle Exposed: No Joint Exposed: No Bone Exposed: No Electronic Signature(s) Signed: 07/09/2019 5:49:00 PM By: Levan Hurst RN, BSN Entered By:  Levan Hurst on 07/08/2019 13:52:21 -------------------------------------------------------------------------------- Wound Assessment Details Patient Name: Date of Service: DO RN, [name] 07/08/2019 1:00 PM Medical Record Number: [id number] Patient Account Number: [id number] Date of Birth/Sex: Treating RN: [birth year] [name] (86 y.o. [name] Primary Care Davieon Stockham: Piedad Climes, CHA RLES Other Clinician: Referring Arianna Delsanto: Treating Shakari Qazi/Extender: Mare Ferrari, CHA RLES Weeks in Treatment: 11 Wound Status Wound Number: 2 Primary Arterial Insufficiency Ulcer Etiology: Wound Location: Right T Great oe Wound Open Wounding Event: Gradually Appeared Status: Date Acquired: 03/31/2019 Comorbid Cataracts, Asthma, Chronic Obstructive Pulmonary Disease Weeks Of Treatment: 11 History: (COPD), Congestive Heart Failure, Coronary Artery Disease, Clustered Wound: No Hypertension, Peripheral Arterial Disease, Osteoarthritis, Confinement Anxiety Wound Measurements Length: (cm) 0.3 Width: (cm) 0.5 Depth: (cm) 0.1 Area: (cm) 0.118 Volume: (cm) 0.012 % Reduction in Area: 90.9% % Reduction in Volume: 90.8% Epithelialization: Medium (34-66%) Tunneling: No Undermining: No Wound Description Classification: Unclassifiable Wound Margin: Flat and Intact Exudate Amount: Small Exudate Type: Serosanguineous Exudate Color: red, brown Foul Odor After Cleansing: No Slough/Fibrino Yes Wound Bed Granulation Amount: None Present (0%) Exposed Structure Necrotic Amount: Large (67-100%) Fascia Exposed: No Necrotic Quality: Eschar Fat Layer (Subcutaneous Tissue) Exposed: No Tendon Exposed: No Muscle Exposed: No Joint Exposed:  No Bone Exposed: No Treatment Notes Wound #2 (Right Toe Great) 1. Cleanse With Wound Cleanser 3. Primary Dressing Applied Other primary dressing (specifiy in notes) Notes paint with betadine, netting Electronic Signature(s) Signed: 07/09/2019 5:49:00 PM By: Levan Hurst RN, BSN Entered By: Levan Hurst on 07/08/2019 13:52:38 -------------------------------------------------------------------------------- Wound Assessment Details Patient Name: Date of Service: DO RN, [name] 07/08/2019 1:00 PM Medical Record Number: [id number] Patient Account Number: [id number] Date of Birth/Sex: Treating RN: [birth year] (86 y.o. [name] Primary Care Jaksen Fiorella: Piedad Climes, CHA RLES Other Clinician: Referring Zachary Lovins: Treating Greysen Devino/Extender: Mare Ferrari, CHA RLES Weeks in Treatment: 11 Wound Status Wound Number: 4 Primary Arterial Insufficiency Ulcer Etiology: Wound Location: Right, Medial T Great oe Wound Open Wounding Event: Gradually Appeared Status: Date Acquired: 05/13/2019 Comorbid Cataracts, Asthma, Chronic Obstructive Pulmonary Disease Weeks Of Treatment: 8 History: (COPD), Congestive Heart Failure, Coronary Artery Disease, Clustered Wound: No Hypertension, Peripheral Arterial Disease, Osteoarthritis, Confinement Anxiety Wound Measurements Length: (cm) 2 Width: (cm) 1.2 Depth: (cm) 0.1 Area: (cm) 1.885 Volume: (cm) 0.188 % Reduction in Area: -22.5% % Reduction in Volume: -22.1% Epithelialization: Small (1-33%) Tunneling: No Undermining: No Wound Description Classification: Full Thickness Without Exposed Support Structures Wound Margin: Flat and Intact Exudate Amount: Small Exudate Type: Serous Exudate Color: amber Foul Odor After Cleansing: No Slough/Fibrino Yes Wound Bed Granulation Amount: None Present (0%) Exposed Structure Necrotic Amount: Large (67-100%) Fascia Exposed: No Necrotic Quality: Eschar, Adherent Slough Fat Layer (Subcutaneous  Tissue) Exposed: No Tendon Exposed: No Muscle Exposed: No Joint Exposed: No Bone Exposed: No Treatment Notes Wound #4 (Right, Medial Toe Great) 1. Cleanse With Wound Cleanser 3. Primary Dressing Applied Other primary dressing (specifiy in notes) Notes paint with betadine, netting Electronic Signature(s) Signed: 07/09/2019 5:49:00 PM By: Levan Hurst RN, BSN Entered By: Levan Hurst on 07/08/2019 13:52:51 -------------------------------------------------------------------------------- Wound Assessment Details Patient Name: Date of Service: DO RN, [name] 07/08/2019 1:00 PM Medical Record Number: [id number] Patient Account Number: [id number] Date of Birth/Sex: Treating RN: [name] (87 y.o. [name] Primary Care Orin Eberwein: Piedad Climes, CHA RLES Other Clinician: Referring Gerard Bonus: Treating Dawanna Grauberger/Extender: Mare Ferrari, CHA RLES Weeks in Treatment: 11 Wound Status Wound Number: 6 Primary Arterial Insufficiency Ulcer Etiology: Wound  Location: Right, Distal, Lateral Foot Wound Open Wounding Event: Gradually Appeared Status: Date Acquired: 05/23/2019 Comorbid Cataracts, Asthma, Chronic Obstructive Pulmonary Disease Weeks Of Treatment: 6 History: (COPD), Congestive Heart Failure, Coronary Artery Disease, Clustered Wound: No Hypertension, Peripheral Arterial Disease, Osteoarthritis, Confinement Anxiety Wound Measurements Length: (cm) 1.4 Width: (cm) 1 Depth: (cm) 0.2 Area: (cm) 1.1 Volume: (cm) 0.22 Wound Description Classification: Unclassifiable Wound Margin: Flat and Intact Exudate Amount: Medium Exudate Type: Serous Exudate Color: amber Foul Odor After Cleansing: No Slough/Fibrino Ye % Reduction in Area: -250.3% % Reduction in Volume: -609.7% Epithelialization: None Tunneling: No Undermining: No s Wound Bed Granulation Amount: None Present (0%) Exposed Structure Necrotic Amount: Large (67-100%) Fascia Exposed: No Necrotic Quality:  Adherent Slough Fat Layer (Subcutaneous Tissue) Exposed: No Tendon Exposed: No Muscle Exposed: No Joint Exposed: No Bone Exposed: No Treatment Notes Wound #6 (Right, Distal, Lateral Foot) 3. Primary Dressing Applied Collegen AG 4. Secondary Dressing ABD Pad Dry Gauze Kerramax/Xtrasorb 6. Support Layer Applied Kerlix/Coban Notes lightly with kerlix and Event organiser) Signed: 07/09/2019 5:49:00 PM By: Levan Hurst RN, BSN Entered By: Levan Hurst on 07/08/2019 13:53:04 -------------------------------------------------------------------------------- Wound Assessment Details Patient Name: Date of Service: DO RN, [name] 07/08/2019 1:00 PM Medical Record Number: [id number] Patient Account Number: [id number] Date of Birth/Sex: Treating RN: [name] (86 y.o. [name] Primary Care Torry Adamczak: Piedad Climes, CHA RLES Other Clinician: Referring Kylea Berrong: Treating Julias Mould/Extender: Mare Ferrari, CHA RLES Weeks in Treatment: 11 Wound Status Wound Number: 9 Primary Skin T ear Etiology: Wound Location: Right Forearm Wound Healed - Epithelialized Wounding Event: Trauma Status: Date Acquired: 06/21/2019 Comorbid Cataracts, Asthma, Chronic Obstructive Pulmonary Disease Weeks Of Treatment: 2 History: (COPD), Congestive Heart Failure, Coronary Artery Disease, Clustered Wound: No Hypertension, Peripheral Arterial Disease, Osteoarthritis, Confinement Anxiety Wound Measurements Length: (cm) Width: (cm) Depth: (cm) Area: (cm) Volume: (cm) 0 % Reduction in Area: 100% 0 % Reduction in Volume: 100% 0 Epithelialization: Large (67-100%) 0 Tunneling: No 0 Undermining: No Wound Description Classification: Full Thickness Without Exposed Support Stru Wound Margin: Flat and Intact Exudate Amount: None Present Wound Bed Granulation Amount: None Present (0%) Necrotic Amount: None Present (0%) ctures Foul Odor After Cleansing: No Slough/Fibrino No Exposed  Structure Fascia Exposed: No Fat Layer (Subcutaneous Tissue) Exposed: No Tendon Exposed: No Muscle Exposed: No Joint Exposed: No Bone Exposed: No Electronic Signature(s) Signed: 07/09/2019 5:49:00 PM By: Levan Hurst RN, BSN Entered By: Levan Hurst on 07/08/2019 13:53:20 -------------------------------------------------------------------------------- Vitals Details Patient Name: Date of Service: DO RN, [name] 07/08/2019 1:00 PM Medical Record Number: [id number] Patient Account Number: [id number] Date of Birth/Sex: Treating RN: [birth year] [name] (86 y.o. [name] Primary Care Alayzha An: Piedad Climes, CHA RLES Other Clinician: Referring Katrine Radich: Treating Francy Mcilvaine/Extender: Mare Ferrari, CHA RLES Weeks in Treatment: 11 Vital Signs Time Taken: 13:37 Temperature (F): 98.4 Height (in): 69 Pulse (bpm): 98 Weight (lbs): 132 Respiratory Rate (breaths/min): 20 Body Mass Index (BMI): 19.5 Blood Pressure (mmHg): 157/81 Reference Range: 80 - 120 mg / dl Electronic Signature(s) Signed: 07/09/2019 5:49:00 PM By: Levan Hurst RN, BSN Entered By: Levan Hurst on 07/08/2019 13:37:40

## 2019-07-09 NOTE — Patient Instructions (Addendum)
Recommendations: Use [place] nasal spray twice a day  Take Mucinex 600mg  1-2 tablets twice a day for 10 days  Continue to use flutter valve  Continue Symbicort 160 two puffs twice a day and  Use Xopenex nebulizer 2-3 times a day  Sending in RX zpack

## 2019-07-09 NOTE — Progress Notes (Signed)
Altier, Raymel [name] ([id number]) Visit Report for 07/08/2019 HPI Details Patient Name: Date of Service: DO RN, [name] 07/08/2019 1:00 PM Medical Record Number: [id number] Patient Account Number: [id number] Date of Birth/Sex: Treating RN: 09-Dec-1929 (86 y.o. [name]) [name] Primary Care Provider: Piedad Climes, CHA RLES Other Clinician: Referring Provider: Treating Provider/Extender: Mare Ferrari, CHA RLES Weeks in Treatment: 11 History of Present Illness HPI Description: 01/30/18 on evaluation today patient presents for initial inspection concerning the skin tear of the right forearm. Fortunately there does not appear to be any evidence of infection and this occurred approximately three days ago. He has been using Vaseline over the region. With that being said the patient does have a history of cataracts COPD, hypertension, peripheral arterial disease, and osteoarthritis. He tells me that he does have a little bit of pain at the site but nothing too significant at this time which is good news. Fortunately this overall appears to be doing fairly well which is good news. No fevers, chills, nausea, or vomiting noted at this time. The biggest issue at this point is that the wound is of quite significant size. READMISSION 04/21/2019 This is an 86 year old man with multiple medical problems who was seen once in the clinic here in January 2020 with a skin tear on his right forearm seen by [name]. This problem this time started sometime recently. He was noted by podiatry to have erosions and wounds of his right foot on toes 1-3. He also saw [name] of dermatology who gave him Silvadene cream for what I think was felt to be a stasis dermatitis of his bilateral lower legs. At some point he was referred to [name]. He was also noted to have previous noninvasive arterial studies that showed no flow to either one of his toes and noncompressible ABIs bilaterally. [name] did noninvasive arterial  studies on him. These showed that he had greater than 75% stenosis in the proximal and mid common femoral artery. Monophasic flow in the posterior tibial and dorsalis pedis positions on the right foot. ABIs again were noncompressible. He was felt to have total occlusion of the superficial femoral artery on the right. The overall interpretation is that he had severe multilevel arterial occlusive disease. Absent femoral pulses and superficial femoral artery occlusions. In spite of this he was not felt to be candidate for arteriography given his bilateral femoral artery occlusions as well he would not be a candidate for endovascular approach secondary to his common femoral artery occlusions and proximal iliac disease. He was not felt to be a candidate for an open infra inguinal bypass. He therefore felt he would need to be monitored over time with the only option being a right below-knee amputation The patient complains currently of pain at night when he is up in bed. The pain in the right foot is better when he puts the leg down over the bed side. This is compatible with rest pain. He has very limited activity i.e. is even exhausted getting dressed in the morning because of cardiopulmonary issues therefore it is difficult to gauge claudication I believe he was referred here by Dr. [name] with one of the major issues is whether he would be a candidate for hyperbaric oxygen. He is not a diabetic. Dr. [name] did give him Silvadene cream for dry flaking skin in his bilateral lower extremities, however the patient is concerned about using this in the face of sulfa allergy. Podiatry had previously given him  in a steroid cream I do not believe these use this either Past medical history is extensive including basal cell and squamous cell skin cancers, combined congestive heart failure, O2 dependent COPD, cor pulmonale, venous stasis dermatitis, peripheral arterial disease, hearing loss, 4/20; the patient still  has a wound on the right first toe. This is on the medial aspect extended there is a second area on the plantar aspect. Most of what the patient talks about is claudication with minimal activity [however the patient is limited by his COPD], or even at rest at night. He is not getting a lot of sleep. We use silver collagen on this wound 5/4; the patient has a wound on the tip of his right great toe also on the medial aspect. Setting of severe nonrevascularizable PAD [see description in HPI]. He does not describe any change in his pain. We have been using silver collagen 5/18; 2-week follow-up. The patient has wound on the tip of his right great toe as well as the medial aspect. He has developed new wounds x2 on the right lateral foot and the right lateral calcaneus. These look like ischemic areas. The patient talks about claudication at night. He seems to be more comfortable during the day. We have been using silver collagen to the wounds. I have previously debrided the eschar on the toes however this comes right back. 6/1; 2-week follow-up. This patient has ischemic wounds on his right foot in the setting of nonunreconstructable PAD [previously reviewed by Dr. Dixon]. He does not really describe severe pain but he does have episodic pain in the right great toe. He has an area on the tip of the right great toe as well as the medial aspect of the right great toe. A new area on the tip of the right fourth toe. He has the area on the right lateral foot fortunately the area on the right lateral heel appears to have epithelialized over. We are using silver collagen to all wounds 06/23/2019 upon evaluation today patient appears to be doing about the same in regard to his foot ulcers. Unfortunately he has 2 new skin tears on his left elbow and the right forearm that were new as of today. He states that it was doing okay until they went to change the dressing and they put some collagen on it that got stuck and  somewhat pulled back the skin unfortunately. Fortunately there is no signs of active infection at this time. No fevers, chills, nausea, vomiting, or diarrhea. 6/29; the patient was hospitalized from 6/18 through 6/23 going in with increasing drainage from the wounds on his right foot. He underwent a angiogram on 06/30/2019 by Dr. Donzetta Matters which revealed multilevel arterial disease including occlusion of the right common femoral artery. He reconstitutes the profunda femoris and the below-knee popliteal artery and appears to have posterior tibial peroneal runoff to the ankle although there is heavy calcification of the popliteal below the knee. He was offered consideration of a right above-knee amputation, palliative and/or hospice care and consideration of a very high risk attempt at bypass. He did receive IV antibiotics. He comes in the clinic today again with the 2 areas that are necrotic on the left great toe and a small punched out area on the right lateral foot. A lot of the erythema on top of the right foot but no warmth. He has a lot of swelling here. The patient is literally tortured over this decision that he needs to make. I went over  this with him in exceptional detail. I do not believe he is a candidate for any form of open bypass surgery. I think his vascular doctors would agree with that. He still has pain at night when he puts his leg up on the bed that is relieved by putting his leg dependent. This is not different from what I remember. He is not septic there is no additional skin breakdown that I can see Electronic Signature(s) Signed: 07/09/2019 3:41:00 PM By: [name] MD Entered By: [name] on 07/08/2019 14:36:05 -------------------------------------------------------------------------------- Physical Exam Details Patient Name: Date of Service: DO RN, [name] 07/08/2019 1:00 PM Medical Record Number: [id number] Patient Account Number: [id number] Date of Birth/Sex:  Treating RN: 04/03/1929 (86 y.o. [name] Primary Care Provider: Piedad Climes, CHA RLES Other Clinician: Referring Provider: Treating Provider/Extender: Mare Ferrari, CHA RLES Weeks in Treatment: 11 Constitutional Patient is hypertensive.. Pulse regular and within target range for patient.Marland Kitchen Respirations regular, non-labored and within target range.. Temperature is normal and within the target range for the patient.Marland Kitchen Appears in no distress. Respiratory work of breathing is normal. Psychiatric Patient appears depressed today. Also exceptionally anxious. Notes Wound exam; the patient has 3 open wounds. On the medial aspect of his right great toe the tip of his right great toe eschar covered he is not a candidate for debridement. He has a punched-out area on the right lateral foot also fully debris covered again no debridement. He has erythema on the dorsal part of his foot 2-3+ edema. This may all be related to the edema. I do not believe he has cellulitis as there is no warmth or tenderness Electronic Signature(s) Signed: 07/09/2019 3:41:00 PM By: [name] MD Entered By: [name] on 07/08/2019 14:37:10 -------------------------------------------------------------------------------- Physician Orders Details Patient Name: Date of Service: DO RN, [name] 07/08/2019 1:00 PM Medical Record Number: [id number] Patient Account Number: [id number] Date of Birth/Sex: Treating RN: 03/13/1929 (86 y.o. [name]) [name] Primary Care Provider: Piedad Climes, CHA RLES Other Clinician: Referring Provider: Treating Provider/Extender: Mare Ferrari, CHA RLES Weeks in Treatment: 11 Verbal / Phone Orders: No Diagnosis Coding ICD-10 Coding Code Description I70.235 Atherosclerosis of native arteries of right leg with ulceration of other part of foot L97.511 Non-pressure chronic ulcer of other part of right foot limited to breakdown of skin I87.323 Chronic venous hypertension  (idiopathic) with inflammation of bilateral lower extremity S51.801A Unspecified open wound of right forearm, initial encounter S51.002A Unspecified open wound of left elbow, initial encounter Follow-up Appointments Return Appointment in 1 week. Dressing Change Frequency Wound #2 Right T Great oe Change Dressing every other day. Wound #4 Right,Medial T Great oe Change Dressing every other day. Wound #6 Right,Distal,Lateral Foot Other: - 2 times per week Skin Barriers/Peri-Wound Care Moisturizing lotion - to both legs and feet daily TCA Cream or Ointment Wound Cleansing May shower and wash wound with soap and water. - on days that dressing is changed Primary Wound Dressing Wound #2 Right T Great oe Other: - paint with betadine Wound #4 Right,Medial T Great oe Other: - paint with betadine Wound #6 Right,Distal,Lateral Foot Silver Collagen - moisten with hydrogel or KY jelly Secondary Dressing Wound #2 Right T Great oe Foam Kerlix/Rolled Gauze Dry Gauze Wound #4 Right,Medial T Great oe Kerlix/Rolled Gauze Dry Gauze Wound #6 Right,Distal,Lateral Foot Kerlix/Rolled Gauze Dry Gauze Heel Cup Off-Loading Open toe surgical shoe to: - right foot [facility] skilled nursing for wound care. -  [facility] Electronic Signature(s) Signed: 07/08/2019 5:22:48 PM By: [name] RN Signed: 07/09/2019 3:41:00 PM By: [name] MD Entered By: [name] on 07/08/2019 14:42:11 -------------------------------------------------------------------------------- Problem List Details Patient Name: Date of Service: DO RN, [name] 07/08/2019 1:00 PM Medical Record Number: [id number] Patient Account Number: [id number] Date of Birth/Sex: Treating RN: 05-08-1929 (86 y.o. [name]) [name] Primary Care Provider: Piedad Climes, CHA RLES Other Clinician: Referring Provider: Treating Provider/Extender: Mare Ferrari, CHA RLES Weeks in Treatment: 11 Active  Problems ICD-10 Encounter Code Description Active Date MDM Diagnosis I70.235 Atherosclerosis of native arteries of right leg with ulceration of other part of 04/21/2019 No Yes foot L97.511 Non-pressure chronic ulcer of other part of right foot limited to breakdown of 04/21/2019 No Yes skin I87.323 Chronic venous hypertension (idiopathic) with inflammation of bilateral lower 04/21/2019 No Yes extremity S51.801A Unspecified open wound of right forearm, initial encounter 06/23/2019 No Yes S51.002A Unspecified open wound of left elbow, initial encounter 06/23/2019 No Yes Inactive Problems Resolved Problems Electronic Signature(s) Signed: 07/09/2019 3:41:00 PM By: [name] MD Entered By: [name] on 07/08/2019 14:31:54 -------------------------------------------------------------------------------- Progress Note Details Patient Name: Date of Service: DO RN, [name] 07/08/2019 1:00 PM Medical Record Number: [id number] Patient Account Number: [id number] Date of Birth/Sex: Treating RN: 02-Nov-1929 (86 y.o. [name]) [name] Primary Care Provider: Piedad Climes, CHA RLES Other Clinician: Referring Provider: Treating Provider/Extender: Mare Ferrari, CHA RLES Weeks in Treatment: 11 Subjective History of Present Illness (HPI) 01/30/18 on evaluation today patient presents for initial inspection concerning the skin tear of the right forearm. Fortunately there does not appear to be any evidence of infection and this occurred approximately three days ago. He has been using Vaseline over the region. With that being said the patient does have a history of cataracts COPD, hypertension, peripheral arterial disease, and osteoarthritis. He tells me that he does have a little bit of pain at the site but nothing too significant at this time which is good news. Fortunately this overall appears to be doing fairly well which is good news. No fevers, chills, nausea, or vomiting noted at this time. The  biggest issue at this point is that the wound is of quite significant size. READMISSION 04/21/2019 This is a 86 year old man with multiple medical problems who was seen once in the clinic here in January 2020 with a skin tear on his right forearm seen by [name]. This problem this time started sometime recently. He was noted by podiatry to have erosions and wounds of his right foot on toes 1-3. He also saw [name] of dermatology who gave him Silvadene cream for what I think was felt to be a stasis dermatitis of his bilateral lower legs. At some point he was referred to [name]. He was also noted to have previous noninvasive arterial studies that showed no flow to either one of his toes and noncompressible ABIs bilaterally. [name] did noninvasive arterial studies on him. These showed that he had greater than 75% stenosis in the proximal and mid common femoral artery. Monophasic flow in the posterior tibial and dorsalis pedis positions on the right foot. ABIs again were noncompressible. He was felt to have total occlusion of the superficial femoral artery on the right. The overall interpretation is that he had severe multilevel arterial occlusive disease. Absent femoral pulses and superficial femoral artery occlusions. In spite of this he was not felt to be candidate for arteriography given his bilateral femoral  artery occlusions as well he would not be a candidate for endovascular approach secondary to his common femoral artery occlusions and proximal iliac disease. He was not felt to be a candidate for an open infra inguinal bypass. He therefore felt he would need to be monitored over time with the only option being a right below-knee amputation The patient complains currently of pain at night when he is up in bed. The pain in the right foot is better when he puts the leg down over the bed side. This is compatible with rest pain. He has very limited activity i.e. is even exhausted getting  dressed in the morning because of cardiopulmonary issues therefore it is difficult to gauge claudication I believe he was referred here by Dr. [name] with one of the major issues is whether he would be a candidate for hyperbaric oxygen. He is not a diabetic. Dr. [name] did give him Silvadene cream for dry flaking skin in his bilateral lower extremities, however the patient is concerned about using this in the face of sulfa allergy. 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 This is on the medial aspect extended there is a second area on the plantar aspect. Most of what the patient talks about is claudication with minimal activity [however the patient is limited by his COPD], or even at rest at night. He is not getting a lot of sleep. We use silver collagen on this wound 5/4; the patient has a wound on the tip of his right great toe also on the medial aspect. Setting of severe nonrevascularizable PAD [see description in HPI]. He does not describe any change in his pain. We have been using silver collagen 5/18; 2-week follow-up. The patient has wound on the tip of his right great toe as well as the medial aspect. He has developed new wounds x2 on the right lateral foot and the right lateral calcaneus. These look like ischemic areas. The patient talks about claudication at night. He seems to be more comfortable during the day. We have been using silver collagen to the wounds. I have previously debrided the eschar on the toes however this comes right back. 6/1; 2-week follow-up. This patient has ischemic wounds on his right foot in the setting of nonunreconstructable PAD  [previously reviewed by Dr. Dixon]. He does not really describe severe pain but he does have episodic pain in the right great toe. He has an area on the tip of the right great toe as well as the medial aspect of the right great toe. A new area on the tip of the right fourth toe. He has the area on the right lateral foot fortunately the area on the right lateral heel appears to have epithelialized over. We are using silver collagen to all wounds 06/23/2019 upon evaluation today patient appears to be doing about the same in regard to his foot ulcers. Unfortunately he has 2 new skin tears on his left elbow and the right forearm that were new as of today. He states that it was doing okay until they went to change the dressing and they put some collagen on it that got stuck and somewhat pulled back the skin unfortunately. Fortunately there  is no signs of active infection at this time. No fevers, chills, nausea, vomiting, or diarrhea. 6/29; the patient was hospitalized from 6/18 through 6/23 going in with increasing drainage from the wounds on his right foot. He underwent a angiogram on 06/30/2019 by Dr. Donzetta Matters which revealed multilevel arterial disease including occlusion of the right common femoral artery. He reconstitutes the profunda femoris and the below-knee popliteal artery and appears to have posterior tibial peroneal runoff to the ankle although there is heavy calcification of the popliteal below the knee. He was offered consideration of a right above-knee amputation, palliative and/or hospice care and consideration of a very high risk attempt at bypass. He did receive IV antibiotics. He comes in the clinic today again with the 2 areas that are necrotic on the left great toe and a small punched out area on the right lateral foot. A lot of the erythema on top of the right foot but no warmth. He has a lot of swelling here. The patient is literally tortured over this decision that he needs to make. I went  over this with him in exceptional detail. I do not believe he is a candidate for any form of open bypass surgery. I think his vascular doctors would agree with that. He still has pain at night when he puts his leg up on the bed that is relieved by putting his leg dependent. This is not different from what I remember. He is not septic there is no additional skin breakdown that I can see Objective Constitutional Patient is hypertensive.. Pulse regular and within target range for patient.Marland Kitchen Respirations regular, non-labored and within target range.. Temperature is normal and within the target range for the patient.Marland Kitchen Appears in no distress. Vitals Time Taken: 1:37 PM, Height: 69 in, Weight: 132 lbs, BMI: 19.5, Temperature: 98.4 F, Pulse: 98 bpm, Respiratory Rate: 20 breaths/min, Blood Pressure: 157/81 mmHg. Respiratory work of breathing is normal. Psychiatric Patient appears depressed today. Also exceptionally anxious. General Notes: Wound exam; the patient has 3 open wounds. On the medial aspect of his right great toe the tip of his right great toe eschar covered he is not a candidate for debridement. He has a punched-out area on the right lateral foot also fully debris covered again no debridement. He has erythema on the dorsal part of his foot 2-3+ edema. This may all be related to the edema. I do not believe he has cellulitis as there is no warmth or tenderness Integumentary (Hair, Skin) Wound #10 status is Healed - Epithelialized. Original cause of wound was Trauma. The wound is located on the Left Elbow. The wound measures 0cm length x 0cm width x 0cm depth; 0cm^2 area and 0cm^3 volume. There is no tunneling or undermining noted. There is a none present amount of drainage noted. The wound margin is flat and intact. There is no granulation within the wound bed. There is no necrotic tissue within the wound bed. Wound #2 status is Open. Original cause of wound was Gradually Appeared. The wound is  located on the Right T Great. The wound measures 0.3cm length x oe 0.5cm width x 0.1cm depth; 0.118cm^2 area and 0.012cm^3 volume. There is no tunneling or undermining noted. There is a small amount of serosanguineous drainage noted. The wound margin is flat and intact. There is no granulation within the wound bed. There is a large (67-100%) amount of necrotic tissue within the wound bed including Eschar. Wound #4 status is Open. Original cause of wound was Gradually  Appeared. The wound is located on the Right,Medial T Great. The wound measures 2cm oe length x 1.2cm width x 0.1cm depth; 1.885cm^2 area and 0.188cm^3 volume. There is no tunneling or undermining noted. There is a small amount of serous drainage noted. The wound margin is flat and intact. There is no granulation within the wound bed. There is a large (67-100%) amount of necrotic tissue within the wound bed including Eschar and Adherent Slough. Wound #6 status is Open. Original cause of wound was Gradually Appeared. The wound is located on the Right,Distal,Lateral Foot. The wound measures 1.4cm length x 1cm width x 0.2cm depth; 1.1cm^2 area and 0.22cm^3 volume. There is no tunneling or undermining noted. There is a medium amount of serous drainage noted. The wound margin is flat and intact. There is no granulation within the wound bed. There is a large (67-100%) amount of necrotic tissue within the wound bed including Adherent Slough. Wound #9 status is Healed - Epithelialized. Original cause of wound was Trauma. The wound is located on the Right Forearm. The wound measures 0cm length x 0cm width x 0cm depth; 0cm^2 area and 0cm^3 volume. There is no tunneling or undermining noted. There is a none present amount of drainage noted. The wound margin is flat and intact. There is no granulation within the wound bed. There is no necrotic tissue within the wound bed. Assessment Active Problems ICD-10 Atherosclerosis of native arteries of  right leg with ulceration of other part of foot Non-pressure chronic ulcer of other part of right foot limited to breakdown of skin Chronic venous hypertension (idiopathic) with inflammation of bilateral lower extremity Unspecified open wound of right forearm, initial encounter Unspecified open wound of left elbow, initial encounter Procedures Wound #6 Pre-procedure diagnosis of Wound #6 is an Arterial Insufficiency Ulcer located on the Right,Distal,Lateral Foot . There was a Double Layer Compression Therapy Procedure by [name], RN. Post procedure Diagnosis Wound #6: Same as Pre-Procedure Plan Follow-up Appointments: Return Appointment in 1 week. Dressing Change Frequency: Wound #2 Right T Great: oe Change Dressing every other day. Skin Barriers/Peri-Wound Care: Moisturizing lotion - to both legs and feet daily TCA Cream or Ointment Wound Cleansing: May shower and wash wound with soap and water. - on days that dressing is changed Primary Wound Dressing: Wound #2 Right T Great: oe Other: - paint with betadine Wound #4 Right,Medial T Great: oe Other: - paint with betadine Wound #6 Right,Distal,Lateral Foot: Silver Collagen - moisten with hydrogel or KY jelly Secondary Dressing: Wound #2 Right T Great: oe Foam Kerlix/Rolled Gauze Dry Gauze Wound #4 Right,Medial T Great: oe Kerlix/Rolled Gauze Dry Gauze Wound #6 Right,Distal,Lateral Foot: Kerlix/Rolled Gauze Dry Gauze Heel Cup Off-Loading: Open toe surgical shoe to: - right foot Home Health: [facility] for wound care. - ADVANCE 1. The patient in my opinion has 2 decisions either a right above-knee amputation or hospice care when the foot becomes necrotic/septic. He is literally tortured by this decision. I told him I would like him to make this decision before he meets with [name] again on 7/6. Fortunately he is not septic currently, his wounds are stable. He has not an unrelenting  pain. The pain that he does describe at night is probably claudication however. 2. I think his comorbidities are much too severe to consider an aggressive attempt at revascularization 3. I went over this with him in some detail. 4. We are going to put steroid in the dorsal foot and put him  in a kerlix light Coban compression. I hope this will help with the erythema and the swelling without aggravating any of his arterial disease. 5. I spent a long time talking about the ethics of this with the patient and his wife. I am hopeful to get him more comfortable with the decision he has to make. I spent 30 minutes in discussion with this patient face-to-face encounter and preparation of this Electronic Signature(s) Signed: 07/09/2019 3:41:00 PM By: [name] MD Entered By: [name] on 07/08/2019 14:39:58 -------------------------------------------------------------------------------- SuperBill Details Patient Name: Date of Service: DO RN, [name] 07/08/2019 Medical Record Number: [id number] Patient Account Number: [id number] Date of Birth/Sex: Treating RN: 1929/03/09 (86 y.o. [name]) [name] Primary Care Provider: Piedad Climes, CHA RLES Other Clinician: Referring Provider: Treating Provider/Extender: Mare Ferrari, CHA RLES Weeks in Treatment: 11 Diagnosis Coding ICD-10 Codes Code Description I70.235 Atherosclerosis of native arteries of right leg with ulceration of other part of foot L97.511 Non-pressure chronic ulcer of other part of right foot limited to breakdown of skin I87.323 Chronic venous hypertension (idiopathic) with inflammation of bilateral lower extremity S51.801A Unspecified open wound of right forearm, initial encounter S51.002A Unspecified open wound of left elbow, initial encounter Facility Procedures CPT4 Code: 03500938 Description: (Facility Use Only) [phone] - [place] RT LEG Modifier: Quantity: 1 Physician Procedures : CPT4 Code  Description Modifier [id number] - WC PHYS LEVEL 4 - EST PT ICD-10 Diagnosis Description I70.235 Atherosclerosis of native arteries of right leg with ulceration of other part of foot L97.511 Non-pressure chronic ulcer of other part of right  foot limited to breakdown of skin Quantity: 1 Electronic Signature(s) Signed: 07/09/2019 3:41:00 PM By: [name] MD Entered By: [name] on 07/08/2019 14:40:39

## 2019-07-09 NOTE — Progress Notes (Signed)
Virtual Visit via Telephone Note  I connected with [name] on 07/09/19 at 12:00 PM EDT by telephone and verified that I am speaking with the correct person using two identifiers.  Location: Patient: Home Provider: Office   I discussed the limitations, risks, security and privacy concerns of performing an evaluation and management service by telephone and the availability of in person appointments. I also discussed with the patient that there may be a patient responsible charge related to this service. The patient expressed understanding and agreed to proceed.   Brief patient profile:  89   yowm MM quit smoking in 1984 with GOLD IV/ 02 dep and steroid dep copd place on NIV by Dr Larkin Ina with profound chronic FTT not improved on NIV    History of Present Illness: 86 year old male, former smoker. PMH significant for COPD GOLD IV, chronic respiratory failure, CAD, acute on chronic diastolic heart failure, NSTEMI, cor pulmonale, GERD, BPH, AKI, LE cellulitis. Patient of Dr. [name], last seen on 05/19/19. Maintained on Symbicort 160  + Spiriva.   PREVIOUS LB PULMONARY ENCOUNTERS:  05/25/2017  f/u ov/Wert re:  [name] iv copd/ 02 dep  Chief Complaint  Patient presents with  . Follow-up    Increased SOB and  fatigue x last week, 2.5L pulsed, compliant with spiriva, symbicort, and xopenex.   Dyspnea:  At rest/ worse walking 10 ft  Cough: none Sleep: on trilogy ok SABA use:  Confused with saba hfa vs neb  rec Add aldactone 50 mg daily  Continue triegy at your volition Ok to adjust xanax (alprazolam) from one-half to one three times a day to calm your breathing down and relieve anxiety related to breathlessness     09/23/2018  f/u ov/Wert re:  GOLD IV copd/ chronic resp failure, noct NIV dep / pred at 10 mg daily  Chief Complaint  Patient presents with  . Follow-up    F/U per patient for increased SOB and fatigue for the past month. Increased post nasal drip.   Dyspnea:  MMRC4  = sob if tries  to leave home or while getting dressed   Cough: no Sleeping: on trilogy flat position  SABA use: about twice a day hfa xoepenex / neb works better and rec by [facility] pulmonary to try perform/bud instead of symbicort  02: 2lpm 24/7  rec Ok to stop symbicort and replace with performist and budesonide when available  See calendar for specific medication instructions and bring it back for each and every office visit for every healthcare provider you see.  Without it,  you may not receive the best quality medical care that we feel you deserve.   02/11/2019  f/u ov/Wert re: COPD GOLD IV 02 dep/ pred at 10 mg daily floor, no better on 10 ceiling  Chief Complaint  Patient presents with  . Follow-up    Pt c/o increased SOB x 5 days. He also c/o sinus congestion. He has been noticing swelling in both legs over the past month.    Dyspnea:  Across the room assoc with leg swelling worse since decreased aldactone per cards to 12.5 mg daily   Cough: none  Sleeping: on vent  SABA use: more  02: 2-3 lpm 24 / 7 adjusting according to how he feels rather than monitoring his pulse oximeter as recommended. rec Target 02 saturation is over 90% ,  No benefit to driving it higher Add pepcid 20 mg at bedtime Let cardiology know about your leg swelling -  aldactone adjustments should be considered        05/19/2019  f/u ov/Wert re: GOLD IV/ chronic resp failure/ got both covid shots Chief Complaint  Patient presents with  . Follow-up    3 month f/u for COPD. States his sinuses have been bothering him for the past 3 weeks. Was given a zpak 3 weeks, it did not help.   Dyspnea: across room limited by R foot now in boot  Cough: none  Sleeping: difficult to do due to leg pain  SABA use: still using hfa xopenex / neb q 3-4 days 02: 2lpm not adjustng    No obvious day to day or daytime variability or assoc excess/ purulent sputum or mucus plugs or hemoptysis or cp or chest tightness, subjective wheeze or overt hb  symptoms.   Sleeping on vNIV  without nocturnal  or early am exacerbation  of respiratory  c/o's or need for noct saba. Also denies any obvious fluctuation of symptoms with weather or environmental changes or other aggravating or alleviating factors except as outlined above   No unusual exposure hx or h/o childhood pna/ asthma or knowledge of premature birth.  07/09/2019- interim hx Patient contacted today for virtual televisit.  He has a new cough. It is slightly productive with thick yellow mucus. He just started using flutter valve a few days ago. He is not taking any mucinex or expectorants.  He has intense nasal drainage. He can not tell if mucus is coming from his sinus or chest He continues to use Symbicort 160+ Spiriva He had a CXR 1-2 weeks ago which showed stable postoperative chest with evidence of chronic obstructive pulmonary disease. No acute cardiopulmonary process Weight is about the same, he is taking two 20mg  lasix tablets  Restarted spirolactone 25mg  by Dr. Harrington Challenger, PCP He saw wound care yesterday for right toe/foot wound  He was previously on clindamycin for cellulitis leg infection but completed this several days ago. He has drug allergy to several antibiotics. He tolerates Zpack well.  Denies fever, chills, sweats, chest tightness or wheezing    Observations/Objective:  - Able to speak in mostly full sentences; wife on phone as well  - Congested cough - No overt wheezing or shortness of breath  Assessment and Plan:  Bronchitis/COPD exacerbation: - New cough with thick yellow mucus. CXR 06/27/19 showed no acute process - Start Mucinex 600mg  1-2 tablets twice a day for 10 days  - Continue Symbicort 160 two puffs twice a day and Spiriva - Continue Prednisone 10mg  daily  - Recommend patient use Xopenex nebulizer 2-3 times a day  - Sending in RX zpack as directed   Rhinitis: - Continue to use [place] nasal spray twice a day  - Continue Flonase nasal spray   Acute on  chronic diastolic heart failure: - Increased leg swelling - Instructed to take addition lasix for total 40mg  daily by Dr. [name] - Resumed Spirolactone 25MG  by Dr. Ross/PCP   Follow Up Instructions:  - Contact our office if symptoms do not improve in 5-7 days or worsen in any way   I discussed the assessment and treatment plan with the patient. The patient was provided an opportunity to ask questions and all were answered. The patient agreed with the plan and demonstrated an understanding of the instructions.   The patient was advised to call back or seek an in-person evaluation if the symptoms worsen or if the condition fails to improve as anticipated.  I provided 22 minutes of non-face-to-face  time during this encounter.   Martyn Ehrich, NP

## 2019-07-10 ENCOUNTER — Telehealth: Payer: Self-pay | Admitting: Internal Medicine

## 2019-07-10 ENCOUNTER — Other Ambulatory Visit: Payer: Self-pay | Admitting: *Deleted

## 2019-07-10 DIAGNOSIS — R350 Frequency of micturition: Secondary | ICD-10-CM | POA: Diagnosis not present

## 2019-07-10 NOTE — Patient Outreach (Signed)
[facility]) Care Management  07/10/2019  [name] 03-08-1929 [id number]   Call placed to member's wife to follow up on decision for treatment of unhealing wounds.  She report they still haven't decided whether they will transition to hospice or proceed with the amputation.  State her biggest concern right now is the urinary retention.  He was seen by urologist and had catheter removed for voiding trial. Unfortunately he wasn't able to void and will be going back to urology today to have catheter replaced.  He is still having some edema, was restarted on Spironolactone.  Member is being seen at wound care center weekly and home health nurse visiting the home for dressing changes twice a week.  [facility] was scheduled to make first visit today but wife called to cancel due to urology visit.  She will call back to reschedule.  She will also call to schedule visit with PCP.  Has follow up appointments with vascular on 7/7, with the wound center on 7/8 and with pulmonology on 7/13.  Other than the retention, denies any urgent concerns.  Will follow up within the next 2 weeks.  Goals Addressed            This Visit's Progress   . Decrease inpatient admissions/ readmissions       CARE PLAN ENTRY (see longitudinal plan of care for additional care plan information)  Current Barriers:  Marland Kitchen Knowledge Deficits related to severity of disease  Nurse Case Manager Clinical Goal(s):  Marland Kitchen Over the next 28 days, patient will verbalize understanding of plan for treatment of non-healing wounds  . Over the next 31 days, patient will not experience hospital admission. Hospital Admissions in last 6 months = 1 . Over the next 28 days, patient will attend all scheduled medical appointments: Urology, PCP, Vascular, Wound center, and Pulmonology  Interventions:  . Inter-disciplinary care team collaboration (see longitudinal plan of care) . Evaluation of current treatment plan related to wound  treatment and patient's adherence to plan as established by provider. . Reviewed medications with patient and discussed importance of taking diuretic to decrease swelling . Collaborated with [facility] regarding initial home visit and assessment . Discussed plans with patient for ongoing care management follow up and provided patient with direct contact information for care management team  Patient Self Care Activities:  . Self administers medications as prescribed . Attends all scheduled provider appointments . Unable to perform ADLs independently  Initial goal documentation       Travis Linas, RN, MSN [place] [phone]

## 2019-07-10 NOTE — Telephone Encounter (Signed)
Patient's appointment has been moved up. Nothing further needed at this time.

## 2019-07-11 ENCOUNTER — Other Ambulatory Visit: Payer: Self-pay | Admitting: Physician Assistant

## 2019-07-11 DIAGNOSIS — L03115 Cellulitis of right lower limb: Secondary | ICD-10-CM | POA: Diagnosis not present

## 2019-07-11 DIAGNOSIS — L97519 Non-pressure chronic ulcer of other part of right foot with unspecified severity: Secondary | ICD-10-CM | POA: Diagnosis not present

## 2019-07-11 DIAGNOSIS — L03116 Cellulitis of left lower limb: Secondary | ICD-10-CM | POA: Diagnosis not present

## 2019-07-11 DIAGNOSIS — I7092 Chronic total occlusion of artery of the extremities: Secondary | ICD-10-CM | POA: Diagnosis not present

## 2019-07-11 DIAGNOSIS — I70235 Atherosclerosis of native arteries of right leg with ulceration of other part of foot: Secondary | ICD-10-CM | POA: Diagnosis not present

## 2019-07-11 DIAGNOSIS — Z4800 Encounter for change or removal of nonsurgical wound dressing: Secondary | ICD-10-CM | POA: Diagnosis not present

## 2019-07-11 MED ORDER — METOPROLOL SUCCINATE ER 25 MG PO TB24: 12.5000 mg | ORAL_TABLET | Freq: Every day | ORAL | 3 refills | Status: DC

## 2019-07-15 ENCOUNTER — Telehealth: Payer: Self-pay | Admitting: Nurse Practitioner

## 2019-07-15 DIAGNOSIS — I10 Essential (primary) hypertension: Secondary | ICD-10-CM | POA: Diagnosis not present

## 2019-07-15 DIAGNOSIS — I7092 Chronic total occlusion of artery of the extremities: Secondary | ICD-10-CM | POA: Diagnosis not present

## 2019-07-15 DIAGNOSIS — I509 Heart failure, unspecified: Secondary | ICD-10-CM | POA: Diagnosis not present

## 2019-07-15 DIAGNOSIS — E782 Mixed hyperlipidemia: Secondary | ICD-10-CM | POA: Diagnosis not present

## 2019-07-15 DIAGNOSIS — I70235 Atherosclerosis of native arteries of right leg with ulceration of other part of foot: Secondary | ICD-10-CM | POA: Diagnosis not present

## 2019-07-15 DIAGNOSIS — F324 Major depressive disorder, single episode, in partial remission: Secondary | ICD-10-CM | POA: Diagnosis not present

## 2019-07-15 DIAGNOSIS — I25810 Atherosclerosis of coronary artery bypass graft(s) without angina pectoris: Secondary | ICD-10-CM | POA: Diagnosis not present

## 2019-07-15 DIAGNOSIS — Z4800 Encounter for change or removal of nonsurgical wound dressing: Secondary | ICD-10-CM | POA: Diagnosis not present

## 2019-07-15 DIAGNOSIS — L03115 Cellulitis of right lower limb: Secondary | ICD-10-CM | POA: Diagnosis not present

## 2019-07-15 DIAGNOSIS — L03116 Cellulitis of left lower limb: Secondary | ICD-10-CM | POA: Diagnosis not present

## 2019-07-15 DIAGNOSIS — N400 Enlarged prostate without lower urinary tract symptoms: Secondary | ICD-10-CM | POA: Diagnosis not present

## 2019-07-15 DIAGNOSIS — L97519 Non-pressure chronic ulcer of other part of right foot with unspecified severity: Secondary | ICD-10-CM | POA: Diagnosis not present

## 2019-07-15 DIAGNOSIS — J449 Chronic obstructive pulmonary disease, unspecified: Secondary | ICD-10-CM | POA: Diagnosis not present

## 2019-07-15 NOTE — Telephone Encounter (Signed)
Please let them know that I think Hospice would be a good option for them both.   Happy to help them in anyway.   Cecille Rubin

## 2019-07-15 NOTE — Telephone Encounter (Signed)
Spoke with the patient's wife who states that the patient restarted aldactone 25 mg BID due to swelling and weeping. Patient is also taking lasix 20 mg BID. Patient has decided not to go through with right AKA.  He has been having home health come out for wound care and has also started on Tramadol. They are considering hospice. Patient's wife states she would like Dr. Tamala Julian and Cecille Rubin to be aware and wanted to thank them for all the care that they provide for the patient. She would like to know if they have any further recommendations at this time.

## 2019-07-15 NOTE — Telephone Encounter (Signed)
I spoke to wife. He is not yet ready to make decision for Hospice.  There is nothing further to recommend but Palliation with Hospice management.

## 2019-07-15 NOTE — Telephone Encounter (Signed)
Patient's wife called to give an update on patient's condition.  She state he was in the hospital from 6/18 to 6/23. He is home now, she states he has edema b/l and he is on furosemide (LASIX) 20 MG tablet and aldactone 25 mg, both twice a day. He has severe right leg problem and was recommend right AKA. Considering hospice. She wants to know what to do.

## 2019-07-16 ENCOUNTER — Other Ambulatory Visit: Payer: Self-pay

## 2019-07-16 ENCOUNTER — Other Ambulatory Visit: Payer: Medicare Other | Admitting: *Deleted

## 2019-07-16 ENCOUNTER — Ambulatory Visit: Payer: Medicare Other | Admitting: Vascular Surgery

## 2019-07-16 DIAGNOSIS — Z515 Encounter for palliative care: Secondary | ICD-10-CM

## 2019-07-17 ENCOUNTER — Encounter (HOSPITAL_BASED_OUTPATIENT_CLINIC_OR_DEPARTMENT_OTHER): Payer: Medicare Other | Attending: Internal Medicine | Admitting: Internal Medicine

## 2019-07-17 DIAGNOSIS — L97511 Non-pressure chronic ulcer of other part of right foot limited to breakdown of skin: Secondary | ICD-10-CM | POA: Diagnosis not present

## 2019-07-17 DIAGNOSIS — M1990 Unspecified osteoarthritis, unspecified site: Secondary | ICD-10-CM | POA: Insufficient documentation

## 2019-07-17 DIAGNOSIS — S51012A Laceration without foreign body of left elbow, initial encounter: Secondary | ICD-10-CM | POA: Insufficient documentation

## 2019-07-17 DIAGNOSIS — J449 Chronic obstructive pulmonary disease, unspecified: Secondary | ICD-10-CM | POA: Insufficient documentation

## 2019-07-17 DIAGNOSIS — S51811A Laceration without foreign body of right forearm, initial encounter: Secondary | ICD-10-CM | POA: Insufficient documentation

## 2019-07-17 DIAGNOSIS — I70235 Atherosclerosis of native arteries of right leg with ulceration of other part of foot: Secondary | ICD-10-CM | POA: Insufficient documentation

## 2019-07-17 DIAGNOSIS — Z9981 Dependence on supplemental oxygen: Secondary | ICD-10-CM | POA: Insufficient documentation

## 2019-07-17 DIAGNOSIS — I87323 Chronic venous hypertension (idiopathic) with inflammation of bilateral lower extremity: Secondary | ICD-10-CM | POA: Diagnosis not present

## 2019-07-17 DIAGNOSIS — Z85828 Personal history of other malignant neoplasm of skin: Secondary | ICD-10-CM | POA: Insufficient documentation

## 2019-07-17 DIAGNOSIS — I509 Heart failure, unspecified: Secondary | ICD-10-CM | POA: Diagnosis not present

## 2019-07-17 DIAGNOSIS — I110 Hypertensive heart disease with heart failure: Secondary | ICD-10-CM | POA: Diagnosis not present

## 2019-07-21 ENCOUNTER — Other Ambulatory Visit: Payer: Self-pay | Admitting: *Deleted

## 2019-07-21 ENCOUNTER — Telehealth: Payer: Self-pay

## 2019-07-21 DIAGNOSIS — I7092 Chronic total occlusion of artery of the extremities: Secondary | ICD-10-CM | POA: Diagnosis not present

## 2019-07-21 DIAGNOSIS — M79606 Pain in leg, unspecified: Secondary | ICD-10-CM | POA: Diagnosis not present

## 2019-07-21 DIAGNOSIS — L97519 Non-pressure chronic ulcer of other part of right foot with unspecified severity: Secondary | ICD-10-CM | POA: Diagnosis not present

## 2019-07-21 DIAGNOSIS — Z515 Encounter for palliative care: Secondary | ICD-10-CM | POA: Diagnosis not present

## 2019-07-21 DIAGNOSIS — I70235 Atherosclerosis of native arteries of right leg with ulceration of other part of foot: Secondary | ICD-10-CM | POA: Diagnosis not present

## 2019-07-21 DIAGNOSIS — Z4800 Encounter for change or removal of nonsurgical wound dressing: Secondary | ICD-10-CM | POA: Diagnosis not present

## 2019-07-21 DIAGNOSIS — L03116 Cellulitis of left lower limb: Secondary | ICD-10-CM | POA: Diagnosis not present

## 2019-07-21 DIAGNOSIS — L03115 Cellulitis of right lower limb: Secondary | ICD-10-CM | POA: Diagnosis not present

## 2019-07-21 NOTE — Patient Outreach (Signed)
[facility]) Care Management  07/21/2019  [name] Jun 06, 1929 [id number]   Call placed to member's wife to follow up on decision to proceed with hospice or amputation. She report they have a virtual visit scheduled with PCP within the next few minutes and she will call this care manager back.  Noted per chart that she has already reached out to cardiology stating member does not want to proceed with amputation.  Will await call back, if no call back will follow up within the next 3-4 business days.  Valente Dash, [place], MSN [place] [phone]

## 2019-07-21 NOTE — Telephone Encounter (Signed)
Pt's son Randall Hiss called with requests to r/s his father's missed appt and to speak with Dr. Scot Dock or Donzetta Matters. He is a Stage manager and has "technical questions" regarding pt's AGM results. I have sent Dr. Scot Dock a staff message. We are working on r/s pt's missed appt.

## 2019-07-22 ENCOUNTER — Encounter: Payer: Self-pay | Admitting: Internal Medicine

## 2019-07-22 ENCOUNTER — Other Ambulatory Visit: Payer: Self-pay

## 2019-07-22 ENCOUNTER — Ambulatory Visit (INDEPENDENT_AMBULATORY_CARE_PROVIDER_SITE_OTHER): Payer: Medicare Other | Admitting: Internal Medicine

## 2019-07-22 DIAGNOSIS — J9611 Chronic respiratory failure with hypoxia: Secondary | ICD-10-CM | POA: Diagnosis not present

## 2019-07-22 DIAGNOSIS — J449 Chronic obstructive pulmonary disease, unspecified: Secondary | ICD-10-CM

## 2019-07-22 DIAGNOSIS — I779 Disorder of arteries and arterioles, unspecified: Secondary | ICD-10-CM | POA: Diagnosis not present

## 2019-07-22 DIAGNOSIS — J9612 Chronic respiratory failure with hypercapnia: Secondary | ICD-10-CM | POA: Diagnosis not present

## 2019-07-22 MED ORDER — AZITHROMYCIN 250 MG PO TABS: ORAL_TABLET | ORAL | 3 refills | Status: DC

## 2019-07-22 NOTE — Progress Notes (Addendum)
[name], [name] ([id number]) Visit Report for 07/17/2019 Arrival Information Details Patient Name: Date of Service: DO RN, [place] V ID L. 07/17/2019 2:30 PM Medical Record Number: [id number] Patient Account Number: [id number] Date of Birth/Sex: Treating RN: [birth year] (86 y.o. [name]) [name] Primary Care : Piedad Climes, CHA RLES Other Clinician: Referring : Treating /Extender: Mare Ferrari, CHA RLES Weeks in Treatment: 12 Visit Information History Since Last Visit All ordered tests and consults were completed: No Patient Arrived: Wheel Chair Added or deleted any medications: No Arrival Time: 14:18 Any new allergies or adverse reactions: No Accompanied By: self Had a fall or experienced change in No Transfer Assistance: None activities of daily living that may affect Patient Identification Verified: Yes risk of falls: Secondary Verification Process Completed: Yes Signs or symptoms of abuse/neglect since last visito No Patient Requires Transmission-Based Precautions: No Hospitalized since last visit: No Patient Has Alerts: Yes Implantable device outside of the clinic excluding No Patient Alerts: Right ABI: [place], TBI: 0 cellular tissue based products placed in the center Left ABI: [place], TBI:0 since last visit: Has Dressing in Place as Prescribed: Yes Has Compression in Place as Prescribed: Yes Pain Present Now: No Electronic Signature(s) Signed: 07/21/2019 5:13:16 PM By: [name] RN Entered By: [name] on 07/17/2019 14:26:50 -------------------------------------------------------------------------------- Clinic Level of Care Assessment Details Patient Name: Date of Service: DO RN, [name] 07/17/2019 2:30 PM Medical Record Number: [id number] Patient Account Number: [id number] Date of Birth/Sex: Treating RN: [birth year] (86 y.o. [name] Primary Care : Piedad Climes, CHA RLES Other Clinician: Referring : Treating /Extender: Mare Ferrari, CHA RLES Weeks in Treatment: 12 Clinic Level of Care Assessment Items TOOL 4 Quantity Score X- 1 0 Use when only an EandM is performed on FOLLOW-UP visit ASSESSMENTS - Nursing Assessment / Reassessment X- 1 10 Reassessment of Co-morbidities (includes updates in patient status) X- 1 5 Reassessment of Adherence to Treatment Plan ASSESSMENTS - Wound and Skin A ssessment / Reassessment [] - 0 Simple Wound Assessment / Reassessment - one wound X- 3 5 Complex Wound Assessment / Reassessment - multiple wounds [] - 0 Dermatologic / Skin Assessment (not related to wound area) ASSESSMENTS - Focused Assessment [] - 0 Circumferential Edema Measurements - multi extremities [] - 0 Nutritional Assessment / Counseling / Intervention X- 1 5 Lower Extremity Assessment (monofilament, tuning fork, pulses) [] - 0 Peripheral Arterial Disease Assessment (using hand held doppler) ASSESSMENTS - Ostomy and/or Continence Assessment and Care [] - 0 Incontinence Assessment and Management [] - 0 Ostomy Care Assessment and Management (repouching, etc.) PROCESS - Coordination of Care X - Simple Patient / Family Education for ongoing care 1 15 [] - 0 Complex (extensive) Patient / Family Education for ongoing care X- 1 10 Staff obtains Programmer, systems, Records, T Results / Process Orders est X- 1 10 Staff telephones HHA, Nursing Homes / Clarify orders / etc [] - 0 Routine Transfer to another Facility (non-emergent condition) [] - 0 Routine Hospital Admission (non-emergent condition) [] - 0 New Admissions / Biomedical engineer / Ordering NPWT Apligraf, etc. , [] - 0 Emergency Hospital Admission (emergent condition) X- 1 10 Simple Discharge Coordination [] - 0 Complex (extensive) Discharge Coordination PROCESS - Special Needs [] - 0 Pediatric / Minor Patient Management [] - 0 Isolation Patient Management [] - 0 Hearing / Language / Visual special needs [] - 0 Assessment of  Community assistance (transportation, D/C planning, etc.) [] - 0 Additional assistance /  Altered mentation [] - 0 Support Surface(s) Assessment (bed, cushion, seat, etc.) INTERVENTIONS - Wound Cleansing / Measurement [] - 0 Simple Wound Cleansing - one wound X- 3 5 Complex Wound Cleansing - multiple wounds X- 1 5 Wound Imaging (photographs - any number of wounds) [] - 0 Wound Tracing (instead of photographs) [] - 0 Simple Wound Measurement - one wound X- 3 5 Complex Wound Measurement - multiple wounds INTERVENTIONS - Wound Dressings [] - 0 Small Wound Dressing one or multiple wounds X- 1 15 Medium Wound Dressing one or multiple wounds [] - 0 Large Wound Dressing one or multiple wounds X- 1 5 Application of Medications - topical [] - 0 Application of Medications - injection INTERVENTIONS - Miscellaneous [] - 0 External ear exam [] - 0 Specimen Collection (cultures, biopsies, blood, body fluids, etc.) [] - 0 Specimen(s) / Culture(s) sent or taken to Lab for analysis [] - 0 Patient Transfer (multiple staff / Civil Service fast streamer / Similar devices) [] - 0 Simple Staple / Suture removal (25 or less) [] - 0 Complex Staple / Suture removal (26 or more) [] - 0 Hypo / Hyperglycemic Management (close monitor of Blood Glucose) [] - 0 Ankle / Brachial Index (ABI) - do not check if billed separately X- 1 5 Vital Signs Has the patient been seen at the hospital within the last three years: Yes Total Score: 140 Level Of Care: New/Established - Level 4 Electronic Signature(s) Signed: 07/21/2019 5:04:17 PM By: Levan Hurst RN, BSN Entered By: Levan Hurst on 07/17/2019 17:16:44 -------------------------------------------------------------------------------- Encounter Discharge Information Details Patient Name: Date of Service: DO RN, [name] 07/17/2019 2:30 PM Medical Record Number: [id number] Patient Account Number: [id number] Date of Birth/Sex: Treating RN: November 19, 1929 (86 y.o. [name] Primary Care : Piedad Climes, CHA RLES Other Clinician: Referring : Treating /Extender: Mare Ferrari, CHA RLES Weeks in Treatment: 12 Encounter Discharge Information Items Discharge Condition: Stable Ambulatory Status: Wheelchair Discharge Destination: Home Transportation: Private Auto Accompanied By: wife Schedule Follow-up Appointment: Yes Clinical Summary of Care: Patient Declined Electronic Signature(s) Signed: 07/17/2019 5:25:24 PM By: Kela Millin Entered By: Kela Millin on 07/17/2019 16:09:47 -------------------------------------------------------------------------------- Lower Extremity Assessment Details Patient Name: Date of Service: DO RN, [name] 07/17/2019 2:30 PM Medical Record Number: [id number] Patient Account Number: [id number] Date of Birth/Sex: Treating RN: November 07, 1929 (86 y.o. [name]) [name] Primary Care : Piedad Climes, CHA RLES Other Clinician: Referring : Treating /Extender: Mare Ferrari, CHA RLES Weeks in Treatment: 12 Edema Assessment Assessed: [Left: No] [Right: No] Edema: [Left: Ye] [Right: s] Calf Left: Right: Point of Measurement: cm From Medial Instep cm 35 cm Ankle Left: Right: Point of Measurement: cm From Medial Instep cm 25 cm Electronic Signature(s) Signed: 07/21/2019 5:13:16 PM By: [name] RN Entered By: [name] on 07/17/2019 14:37:09 -------------------------------------------------------------------------------- Multi Wound Chart Details Patient Name: Date of Service: DO RN, [name] 07/17/2019 2:30 PM Medical Record Number: [id number] Patient Account Number: [id number] Date of Birth/Sex: Treating RN: 1929-06-24 (86 y.o. [name] Primary Care : Piedad Climes, CHA RLES Other Clinician: Referring : Treating /Extender: Mare Ferrari, CHA RLES Weeks in Treatment: 12 Vital Signs Height(in): 69 Pulse(bpm):  106 Weight(lbs): 132 Blood Pressure(mmHg): 135/53 Body Mass Index(BMI): 19 Temperature(F): 98.3 Respiratory Rate(breaths/min): 18 Photos: [2:No Photos Right T Great oe] [4:No Photos Right, Medial T Great oe] [6:No Photos Right, Distal, Lateral Foot] Wound Location: [2:Gradually Appeared] [4:Gradually Appeared] [6:Gradually Appeared] Wounding Event: [  2:Arterial Insufficiency Ulcer] [4:Arterial Insufficiency Ulcer] [6:Arterial Insufficiency Ulcer] Primary Etiology: [2:Cataracts, Asthma, Chronic] [4:Cataracts, Asthma, Chronic] [6:Cataracts, Asthma, Chronic] Comorbid History: [2:Obstructive Pulmonary Disease (COPD), Congestive Heart Failure, Coronary Artery Disease, Hypertension, Peripheral Arterial Disease, Osteoarthritis, Confinement Anxiety 03/31/2019] [4:Obstructive Pulmonary Disease (COPD), Congestive  Heart Failure, Coronary Artery Disease, Hypertension, Peripheral Arterial Disease, Osteoarthritis, Confinement Anxiety 05/13/2019] [6:Obstructive Pulmonary Disease (COPD), Congestive Heart Failure, Coronary Artery Disease, Hypertension, Peripheral Arterial  Disease, Osteoarthritis, Confinement Anxiety 05/23/2019] Date Acquired: [2:12] [4:9] [6:7] Weeks of Treatment: [2:Open] [4:Open] [6:Open] Wound Status: [2:0.2x0.2x0.1] [4:2x1x0.1] [6:11x11x0.1] Measurements L x W x D (cm) [2:0.031] [4:1.571] [6:95.033] A (cm) : rea [2:0.003] [4:0.157] [6:9.503] Volume (cm) : [2:97.60%] [4:-2.10%] [6:-30165.30%] % Reduction in Area: [2:97.70%] [4:-1.90%] [6:-30554.80%] % Reduction in Volume: [2:Unclassifiable] [4:Full Thickness Without Exposed] [6:Unclassifiable] Classification: [2:Small] [4:Support Structures Small] [6:Medium] Exudate A mount: [2:Serosanguineous] [4:Serous] [6:Serous] Exudate Type: [2:red, brown] [4:amber] [6:amber] Exudate Color: [2:Flat and Intact] [4:Flat and Intact] [6:Flat and Intact] Wound Margin: [2:None Present (0%)] [4:None Present (0%)] [6:None Present (0%)] Granulation Amount:  [2:Large (67-100%)] [4:Large (67-100%)] [6:Large (67-100%)] Necrotic Amount: [2:Eschar] [4:Eschar, Adherent Slough] [6:Adherent Slough] Necrotic Tissue: [2:Fascia: No] [4:Fascia: No] [6:Fascia: No] Exposed Structures: [2:Fat Layer (Subcutaneous Tissue) Exposed: No Tendon: No Muscle: No Joint: No Bone: No Medium (34-66%)] [4:Fat Layer (Subcutaneous Tissue) Exposed: No Tendon: No Muscle: No Joint: No Bone: No Small (1-33%)] [6:Fat Layer (Subcutaneous Tissue) Exposed: No  Tendon: No Muscle: No Joint: No Bone: No None] Treatment Notes Wound #2 (Right Toe Great) 1. Cleanse With Wound Cleanser 2. Periwound Care TCA Cream 3. Primary Dressing [employer] Other primary dressing (specifiy in notes) 4. Secondary Dressing Dry Gauze Roll Gauze 5. Secured With Tape Notes netting. paint toe sites with betadine. silver collagen to lateral foot Wound #4 (Right, Medial Toe Great) 1. Cleanse With Wound Cleanser 2. Periwound Care TCA Cream 3. Primary Dressing [employer] Other primary dressing (specifiy in notes) 4. Secondary Dressing Dry Gauze Roll Gauze 5. Secured With Tape Notes netting. paint toe sites with betadine. silver collagen to lateral foot Wound #6 (Right, Distal, Lateral Foot) 1. Cleanse With Wound Cleanser 2. Periwound Care TCA Cream 3. Primary Dressing Applied [employer] Other primary dressing (specifiy in notes) 4. Secondary Dressing Dry Gauze Roll Gauze 5. Secured With Tape Notes netting. paint toe sites with betadine. silver collagen to lateral foot Electronic Signature(s) Signed: 07/17/2019 5:28:43 PM By: [name] MD Signed: 07/21/2019 5:04:17 PM By: Levan Hurst RN, BSN Entered By: [name] on 07/17/2019 16:31:23 -------------------------------------------------------------------------------- Multi-Disciplinary Care Plan Details Patient Name: Date of Service: DO RN, [name] 07/17/2019 2:30 PM Medical Record Number:  [id number] Patient Account Number: [id number] Date of Birth/Sex: Treating RN: February 17, 1929 (86 y.o. [name] Primary Care : Piedad Climes, CHA RLES Other Clinician: Referring : Treating /Extender: Mare Ferrari, CHA RLES Weeks in Treatment: 12 Active Inactive Wound/Skin Impairment Nursing Diagnoses: Impaired tissue integrity Knowledge deficit related to ulceration/compromised skin integrity Goals: Patient/caregiver will verbalize understanding of skin care regimen Date Initiated: 04/21/2019 Target Resolution Date: 08/15/2019 Goal Status: Active Ulcer/skin breakdown will have a volume reduction of 30% by week 4 Date Initiated: 04/21/2019 Date Inactivated: 05/27/2019 Target Resolution Date: 05/23/2019 Goal Status: Met Ulcer/skin breakdown will have a volume reduction of 50% by week 8 Date Initiated: 05/27/2019 Date Inactivated: 06/23/2019 Target Resolution Date: 06/23/2019 Goal Status: Unmet Unmet Reason: vascular disease Ulcer/skin breakdown will have a volume reduction of 80% by week 12 Date Initiated: 06/23/2019 Date Inactivated: 07/17/2019 Target Resolution  Date: 07/21/2019 Goal Status: Unmet Unmet Reason: PAD Interventions: Assess patient/caregiver ability to obtain necessary supplies Assess patient/caregiver ability to perform ulcer/skin care regimen upon admission and as needed Assess ulceration(s) every visit Provide education on ulcer and skin care Notes: Electronic Signature(s) Signed: 07/21/2019 5:04:17 PM By: Levan Hurst RN, BSN Entered By: Levan Hurst on 07/17/2019 17:15:46 -------------------------------------------------------------------------------- Pain Assessment Details Patient Name: Date of Service: DO RN, [name] 07/17/2019 2:30 PM Medical Record Number: [id number] Patient Account Number: [id number] Date of Birth/Sex: Treating RN: 13-Feb-1929 (86 y.o. [name]) [name] Primary Care : Piedad Climes, CHA RLES Other  Clinician: Referring : Treating /Extender: Mare Ferrari, CHA RLES Weeks in Treatment: 12 Active Problems Location of Pain Severity and Description of Pain Patient Has Paino Yes Site Locations With Dressing Change: Yes Duration of the Pain. Constant / Intermittento Constant Rate the pain. Current Pain Level: 7 Worst Pain Level: 10 Least Pain Level: 3 Tolerable Pain Level: 5 Character of Pain Describe the Pain: Aching, Burning, Shooting Pain Management and Medication Current Pain Management: Medication: No Cold Application: No Rest: No Massage: No Activity: No T.E.N.S.: No Heat Application: No Leg drop or elevation: No Is the Current Pain Management Adequate: Inadequate How does your wound impact your activities of daily livingo Sleep: Yes Bathing: No Appetite: Yes Relationship With Others: No Bladder Continence: No Emotions: No Bowel Continence: No Work: No Toileting: No Drive: No Dressing: No Hobbies: No Electronic Signature(s) Signed: 07/21/2019 5:13:16 PM By: [name] RN Entered By: [name] on 07/17/2019 14:28:48 -------------------------------------------------------------------------------- Patient/Caregiver Education Details Patient Name: Date of Service: DO RN, [name] ID Carlean [name] 7/8/2021andnbsp2:30 PM Medical Record Number: [id number] Patient Account Number: [id number] Date of Birth/Gender: Treating RN: 1929/07/21 (86 y.o. [name] Primary Care Physician: Piedad Climes, CHA RLES Other Clinician: Referring Physician: Treating Physician/Extender: Mare Ferrari, CHA RLES Weeks in Treatment: 12 Education Assessment Education Provided To: Patient Education Topics Provided Wound/Skin Impairment: Methods: Explain/Verbal Responses: State content correctly Electronic Signature(s) Signed: 07/21/2019 5:04:17 PM By: Levan Hurst RN, BSN Entered By: Levan Hurst on 07/17/2019  17:16:03 -------------------------------------------------------------------------------- Wound Assessment Details Patient Name: Date of Service: DO RN, [name] 07/17/2019 2:30 PM Medical Record Number: [id number] Patient Account Number: [id number] Date of Birth/Sex: Treating RN: [birth year] (86 y.o. [name]) [name] Primary Care : Piedad Climes, CHA RLES Other Clinician: Referring : Treating /Extender: Mare Ferrari, CHA RLES Weeks in Treatment: 12 Wound Status Wound Number: 2 Primary Arterial Insufficiency Ulcer Etiology: Etiology: Wound Location: Right T Great oe Wound Open Wounding Event: Gradually Appeared Status: Date Acquired: 03/31/2019 Comorbid Cataracts, Asthma, Chronic Obstructive Pulmonary Disease Weeks Of Treatment: 12 History: (COPD), Congestive Heart Failure, Coronary Artery Disease, Clustered Wound: No Hypertension, Peripheral Arterial Disease, Osteoarthritis, Confinement Anxiety Photos Wound Measurements Length: (cm) 0.2 Width: (cm) 0.2 Depth: (cm) 0.1 Area: (cm) 0.031 Volume: (cm) 0.003 % Reduction in Area: 97.6% % Reduction in Volume: 97.7% Epithelialization: Medium (34-66%) Tunneling: No Undermining: No Wound Description Classification: Unclassifiable Wound Margin: Flat and Intact Exudate Amount: Small Exudate Type: Serosanguineous Exudate Color: red, brown Foul Odor After Cleansing: No Slough/Fibrino Yes Wound Bed Granulation Amount: None Present (0%) Exposed Structure Necrotic Amount: Large (67-100%) Fascia Exposed: No Necrotic Quality: Eschar Fat Layer (Subcutaneous Tissue) Exposed: No Tendon Exposed: No Muscle Exposed: No Joint Exposed: No Bone Exposed: No Treatment Notes Wound #2 (Right Toe Great) 1. Cleanse With Wound Cleanser 2. Periwound Care TCA Cream 3. Primary Dressing Applied [employer] Other primary  dressing (specifiy in notes) 4. Secondary Dressing Dry Gauze Roll Gauze 5. Secured  With Tape Notes netting. paint toe sites with betadine. silver collagen to lateral foot Electronic Signature(s) Signed: 07/18/2019 2:47:42 PM By: Mikeal Hawthorne EMT/HBOT/SD Signed: 07/21/2019 5:13:16 PM By: [name] RN Entered By: Mikeal Hawthorne on 07/18/2019 13:34:53 -------------------------------------------------------------------------------- Wound Assessment Details Patient Name: Date of Service: DO RN, [name] 07/17/2019 2:30 PM Medical Record Number: [id number] Patient Account Number: [id number] Date of Birth/Sex: Treating RN: 04-19-1929 (86 y.o. [name]) [name] Primary Care Hong Moring: Piedad Climes, CHA RLES Other Clinician: Referring Verlee Pope: Treating Indea Dearman/Extender: Mare Ferrari, CHA RLES Weeks in Treatment: 12 Wound Status Wound Number: 4 Primary Arterial Insufficiency Ulcer Etiology: Wound Location: Right, Medial T Great oe Wound Open Wounding Event: Gradually Appeared Status: Date Acquired: 05/13/2019 Comorbid Cataracts, Asthma, Chronic Obstructive Pulmonary Disease Weeks Of Treatment: 9 History: (COPD), Congestive Heart Failure, Coronary Artery Disease, Clustered Wound: No Hypertension, Peripheral Arterial Disease, Osteoarthritis, Confinement Anxiety Photos Photo Uploaded By: Mikeal Hawthorne on 07/18/2019 13:34:01 Wound Measurements Length: (cm) 2 Width: (cm) 1 Depth: (cm) 0.1 Area: (cm) 1.571 Volume: (cm) 0.157 % Reduction in Area: -2.1% % Reduction in Volume: -1.9% Epithelialization: Small (1-33%) Tunneling: No Undermining: No Wound Description Classification: Full Thickness Without Exposed Support Structu Wound Margin: Flat and Intact Exudate Amount: Small Exudate Type: Serous Exudate Color: amber res Foul Odor After Cleansing: No Slough/Fibrino Yes Wound Bed Granulation Amount: None Present (0%) Exposed Structure Necrotic Amount: Large (67-100%) Fascia Exposed: No Necrotic Quality: Eschar, Adherent Slough Fat Layer (Subcutaneous  Tissue) Exposed: No Tendon Exposed: No Muscle Exposed: No Joint Exposed: No Bone Exposed: No Treatment Notes Wound #4 (Right, Medial Toe Great) 1. Cleanse With Wound Cleanser 2. Periwound Care TCA Cream 3. Primary Dressing Applied [employer] Other primary dressing (specifiy in notes) 4. Secondary Dressing Dry Gauze Roll Gauze 5. Secured With Tape Notes netting. paint toe sites with betadine. silver collagen to lateral foot Electronic Signature(s) Signed: 07/21/2019 5:13:16 PM By: [name] RN Entered By: [name] on 07/17/2019 14:40:17 -------------------------------------------------------------------------------- Wound Assessment Details Patient Name: Date of Service: DO RN, [name] 07/17/2019 2:30 PM Medical Record Number: [id number] Patient Account Number: [id number] Date of Birth/Sex: Treating RN: [birth year] (86 y.o. [name]) [name] Primary Care Moneka Mcquinn: Piedad Climes, CHA RLES Other Clinician: Referring Sunya Humbarger: Treating Malikah Principato/Extender: Mare Ferrari, CHA RLES Weeks in Treatment: 12 Wound Status Wound Number: 6 Primary Arterial Insufficiency Ulcer Etiology: Wound Location: Right, Distal, Lateral Foot Wound Open Wounding Event: Gradually Appeared Status: Date Acquired: 05/23/2019 Comorbid Cataracts, Asthma, Chronic Obstructive Pulmonary Disease Weeks Of Treatment: 7 History: (COPD), Congestive Heart Failure, Coronary Artery Disease, Clustered Wound: No Hypertension, Peripheral Arterial Disease, Osteoarthritis, Confinement Anxiety Photos Wound Measurements Length: (cm) 11 Width: (cm) 11 Depth: (cm) 0.1 Area: (cm) 95.033 Volume: (cm) 9.503 % Reduction in Area: -30165.3% % Reduction in Volume: -30554.8% Epithelialization: None Tunneling: No Undermining: No Wound Description Classification: Unclassifiable Wound Margin: Flat and Intact Exudate Amount: Medium Exudate Type: Serous Exudate Color: amber Foul Odor After Cleansing:  No Slough/Fibrino Yes Wound Bed Granulation Amount: None Present (0%) Exposed Structure Necrotic Amount: Large (67-100%) Fascia Exposed: No Necrotic Quality: Adherent Slough Fat Layer (Subcutaneous Tissue) Exposed: No Tendon Exposed: No Muscle Exposed: No Joint Exposed: No Bone Exposed: No Treatment Notes Wound #6 (Right, Distal, Lateral Foot) 1. Cleanse With Wound Cleanser 2. Periwound Care TCA Cream 3. Primary Dressing Applied [employer] Other primary dressing (specifiy in notes) 4. Secondary Dressing Dry  Gauze Roll Gauze 5. Secured With Tape Notes netting. paint toe sites with betadine. silver collagen to lateral foot Electronic Signature(s) Signed: 07/18/2019 2:47:42 PM By: Mikeal Hawthorne EMT/HBOT/SD Signed: 07/21/2019 5:13:16 PM By: [name] RN Entered By: Mikeal Hawthorne on 07/18/2019 13:35:13 -------------------------------------------------------------------------------- Vitals Details Patient Name: Date of Service: DO RN, [name] 07/17/2019 2:30 PM Medical Record Number: [id number] Patient Account Number: [id number] Date of Birth/Sex: Treating RN: 1929/10/16 (86 y.o. [name]) Dolores Lory, [place] Primary Care Yessenia Maillet: Piedad Climes, CHA RLES Other Clinician: Referring Brodin Gelpi: Treating Hersel Mcmeen/Extender: Mare Ferrari, CHA RLES Weeks in Treatment: 12 Vital Signs Time Taken: 14:24 Temperature (F): 98.3 Height (in): 69 Pulse (bpm): 106 Weight (lbs): 132 Respiratory Rate (breaths/min): 18 Body Mass Index (BMI): 19.5 Blood Pressure (mmHg): 135/53 Reference Range: 80 - 120 mg / dl Electronic Signature(s) Signed: 07/21/2019 5:13:16 PM By: [name] RN Entered By: [name] on 07/17/2019 14:27:00

## 2019-07-22 NOTE — Progress Notes (Signed)
Subjective:     Patient ID: [name], male    DOB: Apr 22, 1929   MRN: [id number]   Brief patient profile:  82   yowm MM quit smoking in 1984 with GOLD IV/ 02 dep and steroid dep copd place on NIV by Dr Larkin Ina with profound chronic FTT not improved on NIV    History of Present Illness  05/25/2017  f/u ov/[name] re:  [name] iv copd/ 02 dep  Chief Complaint  Patient presents with  . Follow-up    Increased SOB and  fatigue x last week, 2.5L pulsed, compliant with spiriva, symbicort, and xopenex.   Dyspnea:  At rest/ worse walking 10 ft  Cough: none Sleep: on trilogy ok SABA use:  Confused with saba hfa vs neb  rec Add aldactone 50 mg daily  Continue triegy at your volition Ok to adjust xanax (alprazolam) from one-half to one three times a day to calm your breathing down and relieve anxiety related to breathlessness       09/23/2018  f/u ov/[name] re:  GOLD IV copd/ chronic resp failure, noct NIV dep / pred at 10 mg daily  Chief Complaint  Patient presents with  . Follow-up    F/U per patient for increased SOB and fatigue for the past month. Increased post nasal drip.   Dyspnea:  MMRC4  = sob if tries to leave home or while getting dressed   Cough: no Sleeping: on trilogy flat position  SABA use: about twice a day hfa xoepenex / neb works better and rec by [facility] to try perform/bud instead of symbicort  02: 2lpm 24/7  rec Ok to stop symbicort and replace with performist and budesonide when available  See calendar for specific medication instructions and bring it back for each and every office visit for every healthcare provider you see.  Without it,  you may not receive the best quality medical care that we feel you deserve.   02/11/2019  f/u ov/[name] re: COPD GOLD IV 02 dep/ pred at 10 mg daily floor, no better on 10 ceiling  Chief Complaint  Patient presents with  . Follow-up    Pt c/o increased SOB x 5 days. He also c/o sinus congestion. He has been noticing swelling in  both legs over the past month.    Dyspnea:  Across the room assoc with leg swelling worse since decreased aldactone per cards to 12.5 mg daily   Cough: none  Sleeping: on vent  SABA use: more  02: 2-3 lpm 24 / 7 adjusting according to how he feels rather than monitoring his pulse oximeter as recommended. rec [employer] 02 saturation is over 90% ,  No benefit to driving it higher Add pepcid 20 mg at bedtime Let cardiology know about your leg swelling - aldactone adjustments should be considered          05/19/2019  f/u ov/[name] re: GOLD IV/ chronic resp failure/ got both covid shots Chief Complaint  Patient presents with  . Follow-up    3 month f/u for COPD. States his sinuses have been bothering him for the past 3 weeks. Was given a zpak 3 weeks, it did not help.   Dyspnea: across room limited by R foot now in boot  Cough: none  Sleeping: difficult to do due to leg pain  SABA use: still using hfa xopenex / neb q 3-4 days 02: 2lpm not adjusting  rec Make sure you check your oxygen saturations at highest level of activity  to be sure it stays over 90%     07/22/2019  f/u ov/[name] re: endstage copd  Chief Complaint  Patient presents with  . Acute Visit    increased mucus production x 4 days- white/cream, colored sputum- took zpack recently.    Dyspnea:  At rest Cough: slt more vol/ discoloration/ better on zmax Sleeping: on vent  SABA use: doesn't really help 02: 2lpm 24/7    No obvious day to day or daytime variability or assoc   mucus plugs or hemoptysis or cp or chest tightness, subjective wheeze or overt sinus or hb symptoms.   Sleeping on vent without nocturnal  or early am exacerbation  of respiratory  c/o's or need for noct saba. Also denies any obvious fluctuation of symptoms with weather or environmental changes or other aggravating or alleviating factors except as outlined above   No unusual exposure hx or h/o childhood pna/ asthma or knowledge of premature birth.  Current  Allergies, Complete Past Medical History, Past Surgical History, Family History, and Social History were reviewed in [employer] record.  ROS  The following are not active complaints unless bolded Hoarseness, sore throat, dysphagia, dental problems, itching, sneezing,  nasal congestion or discharge of excess mucus or purulent secretions, ear ache,   fever, chills, sweats, unintended wt loss or wt gain, classically pleuritic or exertional cp,  orthopnea pnd or arm/hand swelling  or leg swelling, presyncope, palpitations, abdominal pain, anorexia, nausea, vomiting, diarrhea  or change in bowel habits or change in bladder habits, change in stools or change in urine, dysuria, hematuria,  rash, arthralgias, visual complaints, headache, numbness, weakness or ataxia or problems with walking or coordination,  change in mood or  memory.        Current Meds  Medication Sig  . acetaminophen (TYLENOL) 325 MG tablet Take 650 mg by mouth every 6 (six) hours as needed.  . ALPRAZolam (XANAX) 0.25 MG tablet TAKE 1 TABLET BY MOUTH THREE TIMES DAILY AS NEEDED (Patient taking differently: Take 0.125-0.25 mg by mouth 3 (three) times daily as needed for anxiety. )  . aspirin 81 MG tablet Take 81 mg by mouth daily.  Marland Kitchen atorvastatin (LIPITOR) 20 MG tablet TAKE 1 TABLET BY MOUTH EVERY DAY (Patient taking differently: Take 20 mg by mouth at bedtime. )  . Cholecalciferol (VITAMIN D3) 2000 units TABS Take 2,000 Units by mouth daily with lunch.   . famotidine (PEPCID) 20 MG tablet Take 20 mg by mouth at bedtime.   . finasteride (PROSCAR) 5 MG tablet Take 5 mg by mouth daily with lunch.   . fluticasone (FLONASE) 50 MCG/ACT nasal spray Place 2 sprays into both nostrils daily.  . furosemide (LASIX) 20 MG tablet Take 1 tablet (20 mg total) by mouth 2 (two) times daily.  Marland Kitchen HYDROmorphone (DILAUDID) 2 MG tablet Take 2 mg by mouth every 6 (six) hours as needed for severe pain.  Marland Kitchen levalbuterol (XOPENEX HFA) 45  MCG/ACT inhaler Inhale 2 puffs into the lungs every 4 (four) hours as needed for wheezing or shortness of breath.  . levalbuterol (XOPENEX) 0.63 MG/3ML nebulizer solution USE 1 VIAL VIA NEBULIZER EVERY 4 HOURS AS NEEDED FOR WHEEZING OR SHORTNESS OF BREATH (Patient taking differently: Take 0.63 mg by nebulization every 4 (four) hours as needed for wheezing or shortness of breath. )  . metoprolol succinate (TOPROL-XL) 25 MG 24 hr tablet Take 0.5 tablets (12.5 mg total) by mouth daily.  . mometasone (NASONEX) 50 MCG/ACT nasal spray Place  2 sprays into the nose daily.  . Multiple Vitamins-Minerals (PRESERVISION AREDS 2) CAPS Take 1 capsule by mouth daily.  Marland Kitchen NITROSTAT 0.4 MG SL tablet Place 0.4 mg under the tongue every 5 (five) minutes as needed for chest pain.  . NONFORMULARY OR COMPOUNDED ITEM Apply 1 application topically See admin instructions. Triamcinolone/Cetaphil compounded cream- Apply to both legs once a day  . OXYGEN Inhale 2 L/min into the lungs continuous.   . predniSONE (DELTASONE) 10 MG tablet FOR FLARE OF COUGH/WHEEZING MAY INCREASE TO 2 DAILY BETTER, THEN 1 DAILY (Patient taking differently: Take 10-20 mg by mouth in the morning. Take 10 mg by mouth in the morning and may increase to 20 mg as needed for coughing/wheezing (then decrease back to 10 mg once daily))  . PRESCRIPTION MEDICATION See admin instructions. [employer] Trilogy ventilator- At bedtime  . silodosin (RAPAFLO) 4 MG CAPS capsule Take 4 mg by mouth daily.  . sodium chloride (MURO 128) 5 % ophthalmic solution Place 1 drop into both eyes 4 (four) times daily.  Marland Kitchen spironolactone (ALDACTONE) 25 MG tablet Take 25 mg by mouth daily.  . SYMBICORT 160-4.5 MCG/ACT inhaler Inhale 2 puffs into the lungs 2 (two) times daily.  . Tiotropium Bromide Monohydrate (SPIRIVA RESPIMAT) 2.5 MCG/ACT AERS INHALE 2 PUFFS BY MOUTH EVERY DAY (Patient taking differently: Inhale 2 puffs into the lungs daily. )  . vitamin B-12 (CYANOCOBALAMIN) 1000 MCG  tablet Take 1,000 mcg by mouth daily.                    Objective:   Physical Exam    w/c bound elderly wm appears endstage     07/22/2019 134  02/11/2019   138  wt 159 04/28/2010  > 10/14/2010 154  > 10/04/2011 150> 12/21/2011  150 > 149 01/29/2012 >154 04/05/2012 > 05/22/2012 148 > 09/30/2012  146 >>144> 10/15/2012 > 12/18/2012  145 > 148 02/14/2013 >148 03/14/2013 > 04/25/2013 148 > 06/19/2013 150 >>07/17/2013 > 08/27/2013 148 > 10/23/2013  148 >153 12/09/2013 > 02/26/2014  153 > 06/01/2014  152 > 157 06/15/2014 > 09/21/2014 146 > 10/19/2014 151 >>148 11/19/14 > 02/24/2015 148 > 06/30/2015    140 >  10/04/2015  136 > 12/20/2015   142 > 06/13/2016  132 > 09/13/2016   133 >  11/07/2016  139 > 01/11/2017   135 > 02/20/2017  135 >  03/22/2017  132 > 05/25/2017 134  > 06/27/2017  135 > 08/28/2017   135 > 09/13/2017   128  > 10/29/2017  129 > 12/10/2017  133 >  09/23/2018  133 > 12/23/2018   137       HEENT : pt wearing mask not removed for exam due to covid -19 concerns.    NECK :  without JVD/Nodes/TM/ nl carotid upstrokes bilaterally   LUNGS: no acc muscle use,  Mod barrel  contour chest wall with bilateral  Distant bs s audible wheeze and  without cough on insp or exp maneuvers and mod  Hyperresonant  to  percussion bilaterally     CV:  RRR  no s3 or murmur or increase in P2, and  2+ pitting both LE's  ABD:  soft and nontender with pos mid insp Hoover's  in the supine position. No bruits or organomegaly appreciated, bowel sounds nl  MS:     ext warm without deformities, calf tenderness, cyanosis or clubbing No obvious joint restrictions   SKIN: warm and dry without lesions  NEURO:  alert, approp, nl sensorium with  no motor or cerebellar deficits apparent.

## 2019-07-22 NOTE — Progress Notes (Addendum)
[name], [name] ([id number]) Visit Report for 07/17/2019 HPI Details Patient Name: Date of Service: DO RN, [name] 07/17/2019 2:30 PM Medical Record Number: [id number] Patient Account Number: [id number] Date of Birth/Sex: Treating RN: 1930-01-02 (86 y.o. [name] Primary Care Provider: Piedad Climes, CHA RLES Other Clinician: Referring Provider: Treating Provider/Extender: Mare Ferrari, CHA RLES Weeks in Treatment: 12 History of Present Illness HPI Description: 01/30/18 on evaluation today patient presents for initial inspection concerning the skin tear of the right forearm. Fortunately there does not appear to be any evidence of infection and this occurred approximately three days ago. He has been using Vaseline over the region. With that being said the patient does have a history of cataracts COPD, hypertension, peripheral arterial disease, and osteoarthritis. He tells me that he does have a little bit of pain at the site but nothing too significant at this time which is good news. Fortunately this overall appears to be doing fairly well which is good news. No fevers, chills, nausea, or vomiting noted at this time. The biggest issue at this point is that the wound is of quite significant size. READMISSION 04/21/2019 This is a 86 year old man with multiple medical problems who was seen once in the clinic here in January 2020 with a skin tear on his right forearm seen by [name]. This problem this time started sometime recently. He was noted by podiatry to have erosions and wounds of his right foot on toes 1-3. He also saw [name] of dermatology who gave him Silvadene cream for what I think was felt to be a stasis dermatitis of his bilateral lower legs. At some point he was referred to [name]. He was also noted to have previous noninvasive arterial studies that showed no flow to either one of his toes and noncompressible ABIs bilaterally. [name] did noninvasive arterial  studies on him. These showed that he had greater than 75% stenosis in the proximal and mid common femoral artery. Monophasic flow in the posterior tibial and dorsalis pedis positions on the right foot. ABIs again were noncompressible. He was felt to have total occlusion of the superficial femoral artery on the right. The overall interpretation is that he had severe multilevel arterial occlusive disease. Absent femoral pulses and superficial femoral artery occlusions. In spite of this he was not felt to be candidate for arteriography given his bilateral femoral artery occlusions as well he would not be a candidate for endovascular approach secondary to his common femoral artery occlusions and proximal iliac disease. He was not felt to be a candidate for an open infra inguinal bypass. He therefore felt he would need to be monitored over time with the only option being a right below-knee amputation The patient complains currently of pain at night when he is up in bed. The pain in the right foot is better when he puts the leg down over the bed side. This is compatible with rest pain. He has very limited activity i.e. is even exhausted getting dressed in the morning because of cardiopulmonary issues therefore it is difficult to gauge claudication I believe he was referred here by Dr. [name] with one of the major issues is whether he would be a candidate for hyperbaric oxygen. He is not a diabetic. Dr. [name] did give him Silvadene cream for dry flaking skin in his bilateral lower extremities, however the patient is concerned about using this in the face of sulfa allergy. Podiatry had previously given him  in a steroid cream I do not believe these use this either Past medical history is extensive including basal cell and squamous cell skin cancers, combined congestive heart failure, O2 dependent COPD, cor pulmonale, venous stasis dermatitis, peripheral arterial disease, hearing loss, 4/20; the patient still  has a wound on the right first toe. This is on the medial aspect extended there is a second area on the plantar aspect. Most of what the patient talks about is claudication with minimal activity [however the patient is limited by his COPD], or even at rest at night. He is not getting a lot of sleep. We use silver collagen on this wound 5/4; the patient has a wound on the tip of his right great toe also on the medial aspect. Setting of severe nonrevascularizable PAD [see description in HPI]. He does not describe any change in his pain. We have been using silver collagen 5/18; 2-week follow-up. The patient has wound on the tip of his right great toe as well as the medial aspect. He has developed new wounds x2 on the right lateral foot and the right lateral calcaneus. These look like ischemic areas. The patient talks about claudication at night. He seems to be more comfortable during the day. We have been using silver collagen to the wounds. I have previously debrided the eschar on the toes however this comes right back. 6/1; 2-week follow-up. This patient has ischemic wounds on his right foot in the setting of nonunreconstructable PAD [previously reviewed by Dr. Dixon]. He does not really describe severe pain but he does have episodic pain in the right great toe. He has an area on the tip of the right great toe as well as the medial aspect of the right great toe. A new area on the tip of the right fourth toe. He has the area on the right lateral foot fortunately the area on the right lateral heel appears to have epithelialized over. We are using silver collagen to all wounds 06/23/2019 upon evaluation today patient appears to be doing about the same in regard to his foot ulcers. Unfortunately he has 2 new skin tears on his left elbow and the right forearm that were new as of today. He states that it was doing okay until they went to change the dressing and they put some collagen on it that got stuck and  somewhat pulled back the skin unfortunately. Fortunately there is no signs of active infection at this time. No fevers, chills, nausea, vomiting, or diarrhea. 6/29; the patient was hospitalized from 6/18 through 6/23 going in with increasing drainage from the wounds on his right foot. He underwent a angiogram on 06/30/2019 by Dr. Donzetta Matters which revealed multilevel arterial disease including occlusion of the right common femoral artery. He reconstitutes the profunda femoris and the below-knee popliteal artery and appears to have posterior tibial peroneal runoff to the ankle although there is heavy calcification of the popliteal below the knee. He was offered consideration of a right above-knee amputation, palliative and/or hospice care and consideration of a very high risk attempt at bypass. He did receive IV antibiotics. He comes in the clinic today again with the 2 areas that are necrotic on the left great toe and a small punched out area on the right lateral foot. A lot of the erythema on top of the right foot but no warmth. He has a lot of swelling here. The patient is literally tortured over this decision that he needs to make. I went over  this with him in exceptional detail. I do not believe he is a candidate for any form of open bypass surgery. I think his vascular doctors would agree with that. He still has pain at night when he puts his leg up on the bed that is relieved by putting his leg dependent. This is not different from what I remember. He is not septic there is no additional skin breakdown that I can see 7/8 the patient continues to have severe pain in the right foot at night making it difficult for him to rest. There is increasing erythema in the right foot but the foot is cold. I do not think this is an infection I think this is likely to be tissue necrosis from ischemia. I thought he would have made an up decision today about going forward with hospice he has not. He did not go to see Dr.  Doren Custard on 7/6. Again he wants to talk to me at length about amputations. I do not think he will do well after an above-knee amputation on the right. Furthermore I do not think he is a candidate for bypass surgery on the right leg Electronic Signature(s) Signed: 07/17/2019 5:28:43 PM By: [name] MD Entered By: [name] on 07/17/2019 16:33:15 -------------------------------------------------------------------------------- Physical Exam Details Patient Name: Date of Service: DO RN, [name] 07/17/2019 2:30 PM Medical Record Number: [id number] Patient Account Number: [id number] Date of Birth/Sex: Treating RN: 03/23/1929 (86 y.o. [name] Primary Care Provider: Piedad Climes, CHA RLES Other Clinician: Referring Provider: Treating Provider/Extender: Mare Ferrari, CHA RLES Weeks in Treatment: 12 Cardiovascular Pedal pulses are nonpalpable in the foot his foot is cool. Notes Wound exam; the patient has 2 medial wounds on his right great toe as well as an area on the right lateral foot. Erythema on the dorsal foot with 2-3+ edema, very tender. I do not believe this is infected I think this is likely to be tissue necrosis Electronic Signature(s) Signed: 07/17/2019 5:28:43 PM By: [name] MD Entered By: [name] on 07/17/2019 16:34:08 -------------------------------------------------------------------------------- Physician Orders Details Patient Name: Date of Service: DO RN, [name] 07/17/2019 2:30 PM Medical Record Number: [id number] Patient Account Number: [id number] Date of Birth/Sex: Treating RN: Sep 07, 1929 (86 y.o. [name] Primary Care Provider: Piedad Climes, CHA RLES Other Clinician: Referring Provider: Treating Provider/Extender: Mare Ferrari, CHA RLES Weeks in Treatment: 12 Verbal / Phone Orders: No Diagnosis Coding ICD-10 Coding Code Description I70.235 Atherosclerosis of native arteries of right leg with ulceration of other  part of foot L97.511 Non-pressure chronic ulcer of other part of right foot limited to breakdown of skin I87.323 Chronic venous hypertension (idiopathic) with inflammation of bilateral lower extremity S51.801A Unspecified open wound of right forearm, initial encounter S51.002A Unspecified open wound of left elbow, initial encounter Follow-up Appointments Return Appointment in 2 weeks. Dressing Change Frequency Wound #2 Right T Great oe Change Dressing every other day. Wound #4 Right,Medial T Great oe Change Dressing every other day. Wound #6 Right,Distal,Lateral Foot Other: - 2 times per week Skin Barriers/Peri-Wound Care Moisturizing lotion - to both legs and feet daily TCA Cream or Ointment Wound Cleansing May shower and wash wound with soap and water. - on days that dressing is changed Primary Wound Dressing Wound #2 Right T Great oe Other: - paint with betadine Wound #4 Right,Medial T Great oe Other: - paint with betadine Wound #6 Right,Distal,Lateral Foot Silver Collagen - moisten with hydrogel or KY jelly  Secondary Dressing Wound #2 Right T Great oe Foam Kerlix/Rolled Gauze Dry Gauze Wound #4 Right,Medial T Great oe Kerlix/Rolled Gauze Dry Gauze Wound #6 Right,Distal,Lateral Foot Kerlix/Rolled Gauze Dry Gauze Heel Cup Off-Loading Open toe surgical shoe to: - right foot [facility] for wound care. - [facility] Electronic Signature(s) Signed: 07/17/2019 5:28:43 PM By: [name] MD Signed: 07/21/2019 5:04:17 PM By: Levan Hurst RN, BSN Entered By: Levan Hurst on 07/17/2019 15:40:15 -------------------------------------------------------------------------------- Problem List Details Patient Name: Date of Service: DO RN, [name] 07/17/2019 2:30 PM Medical Record Number: [id number] Patient Account Number: [id number] Date of Birth/Sex: Treating RN: 08-16-1929 (86 y.o. [name] Primary Care Provider: Piedad Climes,  CHA RLES Other Clinician: Referring Provider: Treating Provider/Extender: Mare Ferrari, CHA RLES Weeks in Treatment: 12 Active Problems ICD-10 Encounter Code Description Active Date MDM Diagnosis I70.235 Atherosclerosis of native arteries of right leg with ulceration of other part of 04/21/2019 No Yes foot L97.511 Non-pressure chronic ulcer of other part of right foot limited to breakdown of 04/21/2019 No Yes skin I87.323 Chronic venous hypertension (idiopathic) with inflammation of bilateral lower 04/21/2019 No Yes extremity S51.801A Unspecified open wound of right forearm, initial encounter 06/23/2019 No Yes S51.002A Unspecified open wound of left elbow, initial encounter 06/23/2019 No Yes Inactive Problems Resolved Problems Electronic Signature(s) Signed: 07/17/2019 5:28:43 PM By: [name] MD Entered By: [name] on 07/17/2019 16:31:15 -------------------------------------------------------------------------------- Progress Note Details Patient Name: Date of Service: DO RN, [name] 07/17/2019 2:30 PM Medical Record Number: [id number] Patient Account Number: [id number] Date of Birth/Sex: Treating RN: 1929-04-23 (86 y.o. [name] Primary Care Provider: Piedad Climes, CHA RLES Other Clinician: Referring Provider: Treating Provider/Extender: Mare Ferrari, CHA RLES Weeks in Treatment: 12 Subjective History of Present Illness (HPI) 01/30/18 on evaluation today patient presents for initial inspection concerning the skin tear of the right forearm. Fortunately there does not appear to be any evidence of infection and this occurred approximately three days ago. He has been using Vaseline over the region. With that being said the patient does have a history of cataracts COPD, hypertension, peripheral arterial disease, and osteoarthritis. He tells me that he does have a little bit of pain at the site but nothing too significant at this time which is good news.  Fortunately this overall appears to be doing fairly well which is good news. No fevers, chills, nausea, or vomiting noted at this time. The biggest issue at this point is that the wound is of quite significant size. READMISSION 04/21/2019 This is a 86 year old man with multiple medical problems who was seen once in the clinic here in January 2020 with a skin tear on his right forearm seen by [name]. This problem this time started sometime recently. He was noted by podiatry to have erosions and wounds of his right foot on toes 1-3. He also saw [name] of dermatology who gave him Silvadene cream for what I think was felt to be a stasis dermatitis of his bilateral lower legs. At some point he was referred to [name]. He was also noted to have previous noninvasive arterial studies that showed no flow to either one of his toes and noncompressible ABIs bilaterally. [name] did noninvasive arterial studies on him. These showed that he had greater than 75% stenosis in the proximal and mid common femoral artery. Monophasic flow in the posterior tibial and dorsalis pedis positions on the right foot. ABIs again  were noncompressible. He was felt to have total occlusion of the superficial femoral artery on the right. The overall interpretation is that he had severe multilevel arterial occlusive disease. Absent femoral pulses and superficial femoral artery occlusions. In spite of this he was not felt to be candidate for arteriography given his bilateral femoral artery occlusions as well he would not be a candidate for endovascular approach secondary to his common femoral artery occlusions and proximal iliac disease. He was not felt to be a candidate for an open infra inguinal bypass. He therefore felt he would need to be monitored over time with the only option being a right below-knee amputation The patient complains currently of pain at night when he is up in bed. The pain in the right foot is better  when he puts the leg down over the bed side. This is compatible with rest pain. He has very limited activity i.e. is even exhausted getting dressed in the morning because of cardiopulmonary issues therefore it is difficult to gauge claudication I believe he was referred here by Dr. [name] with one of the major issues is whether he would be a candidate for hyperbaric oxygen. He is not a diabetic. Dr. [name] did give him Silvadene cream for dry flaking skin in his bilateral lower extremities, however the patient is concerned about using this in the face of sulfa allergy. 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 This is on the medial aspect extended there is a second area on the plantar aspect. Most of what the patient talks about is claudication with minimal activity [however the patient is limited by his COPD], or even at rest at night. He is not getting a lot of sleep. We use silver collagen on this wound 5/4; the patient has a wound on the tip of his right great toe also on the medial aspect. Setting of severe nonrevascularizable PAD [see description in HPI]. He does not describe any change in his pain. We have been using silver collagen 5/18; 2-week follow-up. The patient has wound on the tip of his right great toe as well as the medial aspect. He has developed new wounds x2 on the right lateral foot and the right lateral calcaneus. These look like ischemic areas. The patient talks about claudication at night. He seems to be more comfortable during the day. We have been using silver collagen to the wounds. I have previously debrided the eschar on the toes however this  comes right back. 6/1; 2-week follow-up. This patient has ischemic wounds on his right foot in the setting of nonunreconstructable PAD [previously reviewed by Dr. Dixon]. He does not really describe severe pain but he does have episodic pain in the right great toe. He has an area on the tip of the right great toe as well as the medial aspect of the right great toe. A new area on the tip of the right fourth toe. He has the area on the right lateral foot fortunately the area on the right lateral heel appears to have epithelialized over. We are using silver collagen to all wounds 06/23/2019 upon evaluation today patient appears to be doing about the same in regard to his foot ulcers.  Unfortunately he has 2 new skin tears on his left elbow and the right forearm that were new as of today. He states that it was doing okay until they went to change the dressing and they put some collagen on it that got stuck and somewhat pulled back the skin unfortunately. Fortunately there is no signs of active infection at this time. No fevers, chills, nausea, vomiting, or diarrhea. 6/29; the patient was hospitalized from 6/18 through 6/23 going in with increasing drainage from the wounds on his right foot. He underwent a angiogram on 06/30/2019 by Dr. Donzetta Matters which revealed multilevel arterial disease including occlusion of the right common femoral artery. He reconstitutes the profunda femoris and the below-knee popliteal artery and appears to have posterior tibial peroneal runoff to the ankle although there is heavy calcification of the popliteal below the knee. He was offered consideration of a right above-knee amputation, palliative and/or hospice care and consideration of a very high risk attempt at bypass. He did receive IV antibiotics. He comes in the clinic today again with the 2 areas that are necrotic on the left great toe and a small punched out area on the right lateral foot. A lot of the erythema on top of the  right foot but no warmth. He has a lot of swelling here. The patient is literally tortured over this decision that he needs to make. I went over this with him in exceptional detail. I do not believe he is a candidate for any form of open bypass surgery. I think his vascular doctors would agree with that. He still has pain at night when he puts his leg up on the bed that is relieved by putting his leg dependent. This is not different from what I remember. He is not septic there is no additional skin breakdown that I can see 7/8 the patient continues to have severe pain in the right foot at night making it difficult for him to rest. There is increasing erythema in the right foot but the foot is cold. I do not think this is an infection I think this is likely to be tissue necrosis from ischemia. I thought he would have made an up decision today about going forward with hospice he has not. He did not go to see [name] on 7/6. Again he wants to talk to me at length about amputations. I do not think he will do well after an above-knee amputation on the right. Furthermore I do not think he is a candidate for bypass surgery on the right leg Objective Constitutional Vitals Time Taken: 2:24 PM, Height: 69 in, Weight: 132 lbs, BMI: 19.5, Temperature: 98.3 F, Pulse: 106 bpm, Respiratory Rate: 18 breaths/min, Blood Pressure: 135/53 mmHg. Cardiovascular Pedal pulses are nonpalpable in the foot his foot is cool. General Notes: Wound exam; the patient has 2 medial wounds on his right great toe as well as an area on the right lateral foot. Erythema on the dorsal foot with 2-3+ edema, very tender. I do not believe this is infected I think this is likely to be tissue necrosis Integumentary (Hair, Skin) Wound #2 status is Open. Original cause of wound was Gradually Appeared. The wound is located on the Right T Great. The wound measures 0.2cm length x oe 0.2cm width x 0.1cm depth; 0.031cm^2 area and 0.003cm^3  volume. There is no tunneling or undermining noted. There is a small amount of serosanguineous drainage noted. The wound margin is flat and intact. There is no granulation  within the wound bed. There is a large (67-100%) amount of necrotic tissue within the wound bed including Eschar. Wound #4 status is Open. Original cause of wound was Gradually Appeared. The wound is located on the Right,Medial T Great. The wound measures 2cm oe length x 1cm width x 0.1cm depth; 1.571cm^2 area and 0.157cm^3 volume. There is no tunneling or undermining noted. There is a small amount of serous drainage noted. The wound margin is flat and intact. There is no granulation within the wound bed. There is a large (67-100%) amount of necrotic tissue within the wound bed including Eschar and Adherent Slough. Wound #6 status is Open. Original cause of wound was Gradually Appeared. The wound is located on the Right,Distal,Lateral Foot. The wound measures 11cm length x 11cm width x 0.1cm depth; 95.033cm^2 area and 9.503cm^3 volume. There is no tunneling or undermining noted. There is a medium amount of serous drainage noted. The wound margin is flat and intact. There is no granulation within the wound bed. There is a large (67-100%) amount of necrotic tissue within the wound bed including Adherent Slough. Assessment Active Problems ICD-10 Atherosclerosis of native arteries of right leg with ulceration of other part of foot Non-pressure chronic ulcer of other part of right foot limited to breakdown of skin Chronic venous hypertension (idiopathic) with inflammation of bilateral lower extremity Unspecified open wound of right forearm, initial encounter Unspecified open wound of left elbow, initial encounter Plan Follow-up Appointments: Return Appointment in 2 weeks. Dressing Change Frequency: Wound #2 Right T Great: oe Change Dressing every other day. Wound #4 Right,Medial T Great: oe Change Dressing every other  day. Wound #6 Right,Distal,Lateral Foot: Other: - 2 times per week Skin Barriers/Peri-Wound Care: Moisturizing lotion - to both legs and feet daily TCA Cream or Ointment Wound Cleansing: May shower and wash wound with soap and water. - on days that dressing is changed Primary Wound Dressing: Wound #2 Right T Great: oe Other: - paint with betadine Wound #4 Right,Medial T Great: oe Other: - paint with betadine Wound #6 Right,Distal,Lateral Foot: Silver Collagen - moisten with hydrogel or KY jelly Secondary Dressing: Wound #2 Right T Great: oe Foam Kerlix/Rolled Gauze Dry Gauze Wound #4 Right,Medial T Great: oe Kerlix/Rolled Gauze Dry Gauze Wound #6 Right,Distal,Lateral Foot: Kerlix/Rolled Gauze Dry Gauze Heel Cup Off-Loading: Open toe surgical shoe to: - right foot Home Health: [facility] for wound care. - ADVANCE 1. Active acute on chronic ischemia in the right foot. I think the erythema and swelling is tissue necrosis not cellulitis 2. He was given tramadol for the pain by his primary physician this is not working. 3. I think this patient requires hospice care. I do not think he is a candidate for complicated right leg bypass and I do not believe he will do well after an amputation. I think if his leg is amputated he will simply give up and end up dying anyway. I think hospice care to help with the ischemic pain with narcotics is indicated. Electronic Signature(s) Signed: 07/17/2019 5:28:43 PM By: [name] MD Entered By: [name] on 07/17/2019 16:35:48 -------------------------------------------------------------------------------- SuperBill Details Patient Name: Date of Service: DO RN, [name] 07/17/2019 Medical Record Number: [id number] Patient Account Number: [id number] Date of Birth/Sex: Treating RN: 1929/07/28 (86 y.o. [name] Primary Care Provider: Piedad Climes, CHA RLES Other Clinician: Referring Provider: Treating  Provider/Extender: Mare Ferrari, CHA RLES Weeks in Treatment: 12 Diagnosis Coding ICD-10 Codes Code Description [phone]  Atherosclerosis of native arteries of right leg with ulceration of other part of foot L97.511 Non-pressure chronic ulcer of other part of right foot limited to breakdown of skin I87.323 Chronic venous hypertension (idiopathic) with inflammation of bilateral lower extremity S51.801A Unspecified open wound of right forearm, initial encounter S51.002A Unspecified open wound of left elbow, initial encounter Facility Procedures Physician Procedures : CPT4 Code Description Modifier [id number] - WC PHYS LEVEL 3 - EST PT ICD-10 Diagnosis Description I70.235 Atherosclerosis of native arteries of right leg with ulceration of other part of foot L97.511 Non-pressure chronic ulcer of other part of right  foot limited to breakdown of skin I87.323 Chronic venous hypertension (idiopathic) with inflammation of bilateral lower extremity Quantity: 1 Electronic Signature(s) Signed: 07/17/2019 5:28:43 PM By: [name] MD Signed: 07/21/2019 5:04:17 PM By: Levan Hurst RN, BSN Entered By: Levan Hurst on 07/17/2019 17:16:54

## 2019-07-22 NOTE — Patient Instructions (Signed)
Zithromax can be taken daily as a preventive but start with 2 on first day.  Support hospice care at this point if you decide against surgery   Pulmonary follow up is as needed

## 2019-07-23 ENCOUNTER — Encounter: Payer: Self-pay | Admitting: Internal Medicine

## 2019-07-23 NOTE — Progress Notes (Signed)
COMMUNITY PALLIATIVE CARE RN NOTE  PATIENT NAME: JAQUEL GLASSBURN DOB: 1929/05/05 MRN: [id number]  PRIMARY CARE PROVIDER: [name], MD  RESPONSIBLE PARTY: Hrithik Boschee (wife) Acct ID - Guarantor Home Phone Work Phone Relationship Acct Type  [id number] - Holland [name] [phone]  Self P/F     [address], [name], [place] [address]   Due to the COVID-19 crisis, this virtual check-in visit was done via telephone from my office and it was initiated and consent by this patient and or family.  PLAN OF CARE and INTERVENTION:  1. ADVANCE CARE PLANNING/GOALS OF CARE: Goal is for patient to remain at home with his wife.  2. PATIENT/CAREGIVER EDUCATION: Symptom management, pain management, safe mobility/transfers, s/s of infection 3. DISEASE STATUS: Virtual check-in visit completed via telephone. Patient has been experiencing increased pain in his left foot from his wounds. Wife states that he did not sleep well last night d/t severe pain. She has just finished changing his dressing and states that the wounds are worsening. She describes that a layer of skin is gone. He is currently taking Tramadol but does not feel that this is effective. He was supposed to see the vascular surgeon this morning but his wife cancelled the visit. He says that he still does not to have his leg amputated. He is having more difficulty walking. She states that she has contacted [facility]'s referral center and requested a hospice referral. She inquired whether I thought this was a good idea. I agreed that this was a great idea and the hospice AV nurse would be able to explain services in details and answer her questions. He continues with a foley catheter d/t urinary retention. Will continue to monitor.  HISTORY OF PRESENT ILLNESS: This is a 86 yo male with a h/o CHF, PVD, CAD COPD, and NSTEMI. Palliative care team continues to follow patient and visits monthly and PRN   CODE STATUS: DNR ADVANCED DIRECTIVES: Y MOST  FORM: no PPS: 50%   (Duration of visit and documentation 30 minutes)   [name], RN BSN

## 2019-07-23 NOTE — Assessment & Plan Note (Signed)
Quit smoking 1984/ MM - PFTs  05/04/05 FEV1 36% ratio 28% with 29% improvement after bronchodilators DLCO 48%  - PFT's 03/26/08 30% ratio 34 with 14% improvement after bronchodilators DLC0 38 %  - Referred to rehab 12/19/2012 > completed March 2015  - changed to spiriva respimat 02/14/2013  - started daily prednisone 06/19/13 > improved only a little> tapered off mid July 2015 > ok to change to prn prednisone 08/27/2013  - flared off nasonex and gerd rx 09/21/2014 > resumed - 10/19/2014  p extensive coaching HFA effectiveness =    90%  - changed pred to ceiling of 20 / floor of 5 mg daily as of 10/19/2014 > changed to floor of 10 mg daily 10/04/2015  - PFT's  06/08/2015  FEV1 0.58 (22 % ) ratio 27  p No sign % improvement from saba p symb/spiriva prior to study with DLCO  9/9 % corrects to 27 % for alv volume   - changed pred to ceiling of 20 and floor of 5 mg daily 06/13/16 > increased floor to 10 mg daily effective 02/20/2017  - referred back to rehab 09/13/2016  - try off noct vent and start titrating up xanax 01/11/2017 > no change but then admitted with flu and placed back on vent  - alpha one AT 03/01/17  MM      - 11/09/2017 COPD exacerbation. No improvement with prn zpack. CXR clear. Given course doxy and pred taper. Resp therapy home eval/treat  - Spirometry 09/16/2018  FEV1 0.60  (23%)  Ratio 0.33 at [facility]  - Added zmax 250 mg daily 07/23/2019 >>>   Group D in terms of symptom/risk and laba/lama/ICS  therefore appropriate rx at this point >>>  Continue symb/spiriva/ pred at 10 mg floor and add daily zmax to see if helps reduced flares of cb  Nothing else to offer here > rec initiate hospice

## 2019-07-23 NOTE — Assessment & Plan Note (Signed)
Started noct 02 at 2lpm 2008 and on 03/04/09 desat @ > 185 ft so rec wear with activtiy > rm to rm  - 02/15/2011   Walked RA x one lap @ 185 stopped due to  desat > corrected on 2lpm - 02/26/2014  Walked 2lpm  2 laps @ 185 ft each stopped due to  Sob/ sats 88% at nl pace  - 10/19/2014   Walked RA  2 laps @ 185 ft each stopped due to  End of study, slow pace,  sob  But no  desat   05/21/15 Eval by Dr Dedio/ rec trial of NIV  - HCO3  10/27/15 = 34  - HC03  10/06/16  = 28 ? On noct vent (non compliant) - As of admit  02/04/17 back on noct vent/ 02 2lpm 24/7 - HC03 36  08/28/2017 despite reporting using noct vent  - HC03 31  01/04/18  - HC03 32   07/10/18  - HC03  29 12/20/18  Not really sure the vent is adding to his quality of life at this point or whether this will prevent hospice from assuming his care but this is a judgement call I will leave to pt/wife/hospice.   Though somewhat paradoxic, when the lung fails to clear C02 properly and pC02 rises the lung then becomes a more efficient scavenger of C02 allowing lower work of breathing and  better C02 clearance albeit at a higher serum pC02 level - this is why pts can look a lot better than their ABG's would suggest and why it's so difficult to prognosticate endstage dz.  It's also why I strongly rec DNI status (ventilating pts down to a nl pC02 adversely affects this compensatory mechanism) and hospice care at this point.           Each maintenance medication was reviewed in detail including most importantly the difference between maintenance and as needed and under what circumstances the prns are to be used. This was done in the context of a medication calendar review which provided the patient with a user-friendly unambiguous mechanism for medication administration and reconciliation and provides an action plan for all active problems. It is critical that this be shown to every doctor  for modification during the office visit if necessary so the patient  can use it as a working document.      Total time for H and P, chart review, counseling re EOL issues, teaching devices and generating customized AVS unique to this office visit / charting = 30 min

## 2019-07-24 DIAGNOSIS — L97519 Non-pressure chronic ulcer of other part of right foot with unspecified severity: Secondary | ICD-10-CM | POA: Diagnosis not present

## 2019-07-24 DIAGNOSIS — I70235 Atherosclerosis of native arteries of right leg with ulceration of other part of foot: Secondary | ICD-10-CM | POA: Diagnosis not present

## 2019-07-24 DIAGNOSIS — L03115 Cellulitis of right lower limb: Secondary | ICD-10-CM | POA: Diagnosis not present

## 2019-07-24 DIAGNOSIS — I7092 Chronic total occlusion of artery of the extremities: Secondary | ICD-10-CM | POA: Diagnosis not present

## 2019-07-24 DIAGNOSIS — L03116 Cellulitis of left lower limb: Secondary | ICD-10-CM | POA: Diagnosis not present

## 2019-07-24 DIAGNOSIS — Z4800 Encounter for change or removal of nonsurgical wound dressing: Secondary | ICD-10-CM | POA: Diagnosis not present

## 2019-07-25 ENCOUNTER — Ambulatory Visit (INDEPENDENT_AMBULATORY_CARE_PROVIDER_SITE_OTHER): Payer: Medicare Other | Admitting: Vascular Surgery

## 2019-07-25 ENCOUNTER — Other Ambulatory Visit: Payer: Self-pay

## 2019-07-25 ENCOUNTER — Other Ambulatory Visit: Payer: Self-pay | Admitting: *Deleted

## 2019-07-25 ENCOUNTER — Encounter: Payer: Self-pay | Admitting: Vascular Surgery

## 2019-07-25 VITALS — BP 133/74 | HR 70 | Temp 98.2°F | Resp 20 | Ht 69.0 in | Wt 134.0 lb

## 2019-07-25 DIAGNOSIS — I739 Peripheral vascular disease, unspecified: Secondary | ICD-10-CM | POA: Diagnosis not present

## 2019-07-25 NOTE — Progress Notes (Signed)
Patient ID: [name], male   DOB: September 22, 1929, 86 y.o.   MRN: [id number]  Reason for Consult: Follow-up   Referred by [name], MD  Subjective:     HPI:  [name] is a 86 y.o. male is here for follow-up after recent hospitalization where he underwent angiogram was found to have no reconstructive options.  He has been discussed for palliative care versus right above-knee amputation.  Patient does have significant anxiety which prevents his decision-making.  He has many questions today he is accompanied by his wife.  I have previously talked to his son on the phone prior to this visit.  He states that he does have significant right lower extremity pain.  He is taking narcotics which do help but also make him very sleepy.  He is able to take a few steps but his right foot is significantly painful with this.  Left leg does have swelling but no wounds at this time.  Wound on the right foot has gotten worse per the wife.  Past Medical History:  Diagnosis Date  . Arthritis    "generalized" (04/04/2017)  . Asthma   . Basal cell carcinoma    "left ear"  . BPH (benign prostatic hyperplasia)    severe; s/p multiple biopsies  . CAD (coronary artery disease)   . Carotid artery occlusion   . Chronic respiratory failure ([facility] [place])   . Chronic rhinitis   . COPD (chronic obstructive pulmonary disease) ([facility])    2L  O2  . Diastolic heart failure ([place]) 2019  . Dilation of biliary tract   . Elevated troponin 03/09/2017  . Gallstones   . GERD (gastroesophageal reflux disease)   . History of blood transfusion    "w/his CABG" (04/04/2017)  . History of kidney stones   . Hyperlipidemia   . Hypertension   . On home oxygen therapy    "~ 24/7" (04/04/2017)  . Peripheral vascular disease ([place])   . Pneumonia 2019  . Squamous cell carcinoma of skin 04/23/2013   in situ-Right flank (txpbx)  . Squamous cell carcinoma of skin 01/23/2017   [place]/ in siut- Left ear rim (CX3FU)  . Syncope and  collapse   . Ureteral tumor 08/2015   had endoscopic procedure for evaluation, unable to reach for biopsy   Family History  Problem Relation Age of Onset  . Heart disease Mother   . Heart disease Father        Before age 63  . Breast cancer Mother   . Cancer Mother        Breast cancer  . Hypertension Mother   . Other Mother        AAA  and   Amputation  . Heart attack Father   . Stroke Brother        x3, still living   . Peripheral vascular disease Brother   . Heart disease Brother   . Hyperlipidemia Brother   . Hypertension Brother   . Heart disease Brother   . Allergies Brother    Past Surgical History:  Procedure Laterality Date  . ABDOMINAL AORTOGRAM W/LOWER EXTREMITY Bilateral 06/30/2019   Procedure: ABDOMINAL AORTOGRAM W/LOWER EXTREMITY;  Surgeon: Waynetta Sandy, MD;  Location: [facility];  Service: Cardiovascular;  Laterality: Bilateral;  . BASAL CELL CARCINOMA EXCISION Left    ear  . CARDIAC CATHETERIZATION     "prior to bypass"  . CATARACT EXTRACTION W/ INTRAOCULAR LENS  IMPLANT, BILATERAL Bilateral   .  CORONARY ARTERY BYPASS GRAFT  10/2001   LIMA to LAD, SVG to OM1-2, SVG to RCA and PDA  . LITHOTRIPSY    . PROSTATE BIOPSY  Oct. 2014  . TONSILLECTOMY      Short Social History:  Social History   Tobacco Use  . Smoking status: Former Smoker    Packs/day: 2.00    Years: 33.00    Pack years: 66.00    Types: Cigarettes    Quit date: 03/25/1982    Years since quitting: 37.3  . Smokeless tobacco: Never Used  Substance Use Topics  . Alcohol use: Yes    Alcohol/week: 0.0 standard drinks    Comment: 1.5-3 oz daily in the past, minimal use in the last few months    Allergies  Allergen Reactions  . Cefdinir Diarrhea and Other (See Comments)    Severe Diarrhea  . Nitrofurantoin Swelling and Other (See Comments)    Hand Swelling  . Sulfa Antibiotics Anaphylaxis and Swelling  . Sulfonamide Derivatives Swelling and Other (See Comments)     Facial/tongue swelling  . Tape Other (See Comments)    SKIN IS VERY THIN AND TEARS EASILY!!!!! Please do NOT use "plastic" tape- USE PAPER!!  . Ciprofloxacin Itching, Rash and Other (See Comments)    Red itchy hands  . Nitrofurantoin Swelling and Other (See Comments)    Swollen hands  . Amoxicillin Er Other (See Comments)    Frequest urination  . Doxycycline Other (See Comments)    "Felt terrible"  . Levaquin [Levofloxacin] Itching and Rash  . Sertraline Anxiety and Other (See Comments)    Makes the patient jittery    Current Outpatient Medications  Medication Sig Dispense Refill  . acetaminophen (TYLENOL) 325 MG tablet Take 650 mg by mouth every 6 (six) hours as needed.    . ALPRAZolam (XANAX) 0.25 MG tablet TAKE 1 TABLET BY MOUTH THREE TIMES DAILY AS NEEDED (Patient taking differently: Take 0.125-0.25 mg by mouth 3 (three) times daily as needed for anxiety. ) 90 tablet 3  . aspirin 81 MG tablet Take 81 mg by mouth daily.    Marland Kitchen atorvastatin (LIPITOR) 20 MG tablet TAKE 1 TABLET BY MOUTH EVERY DAY (Patient taking differently: Take 20 mg by mouth at bedtime. ) 90 tablet 2  . azithromycin (ZITHROMAX) 250 MG tablet Zpack taper as directed 6 tablet 0  . azithromycin (ZITHROMAX) 250 MG tablet One daily 30 each 3  . Cholecalciferol (VITAMIN D3) 2000 units TABS Take 2,000 Units by mouth daily with lunch.     . famotidine (PEPCID) 20 MG tablet Take 20 mg by mouth at bedtime.     . finasteride (PROSCAR) 5 MG tablet Take 5 mg by mouth daily with lunch.     . fluticasone (FLONASE) 50 MCG/ACT nasal spray Place 2 sprays into both nostrils daily.    . furosemide (LASIX) 20 MG tablet Take 1 tablet (20 mg total) by mouth 2 (two) times daily. 60 tablet 3  . HYDROmorphone (DILAUDID) 2 MG tablet Take 2 mg by mouth every 6 (six) hours as needed for severe pain.    Marland Kitchen levalbuterol (XOPENEX HFA) 45 MCG/ACT inhaler Inhale 2 puffs into the lungs every 4 (four) hours as needed for wheezing or shortness of breath.  15 g 5  . levalbuterol (XOPENEX) 0.63 MG/3ML nebulizer solution USE 1 VIAL VIA NEBULIZER EVERY 4 HOURS AS NEEDED FOR WHEEZING OR SHORTNESS OF BREATH (Patient taking differently: Take 0.63 mg by nebulization every 4 (four) hours as needed  for wheezing or shortness of breath. ) 300 mL 3  . metoprolol succinate (TOPROL-XL) 25 MG 24 hr tablet Take 0.5 tablets (12.5 mg total) by mouth daily. 45 tablet 3  . mometasone (NASONEX) 50 MCG/ACT nasal spray Place 2 sprays into the nose daily. 17 g 5  . Multiple Vitamins-Minerals (PRESERVISION AREDS 2) CAPS Take 1 capsule by mouth daily.    Marland Kitchen NITROSTAT 0.4 MG SL tablet Place 0.4 mg under the tongue every 5 (five) minutes as needed for chest pain.    . NONFORMULARY OR COMPOUNDED ITEM Apply 1 application topically See admin instructions. Triamcinolone/Cetaphil compounded cream- Apply to both legs once a day    . OXYGEN Inhale 2 L/min into the lungs continuous.     . predniSONE (DELTASONE) 10 MG tablet FOR FLARE OF COUGH/WHEEZING MAY INCREASE TO 2 DAILY BETTER, THEN 1 DAILY (Patient taking differently: Take 10-20 mg by mouth in the morning. Take 10 mg by mouth in the morning and may increase to 20 mg as needed for coughing/wheezing (then decrease back to 10 mg once daily)) 100 tablet 2  . PRESCRIPTION MEDICATION See admin instructions. Philips Trilogy ventilator- At bedtime    . silodosin (RAPAFLO) 4 MG CAPS capsule Take 4 mg by mouth daily.    . sodium chloride (MURO 128) 5 % ophthalmic solution Place 1 drop into both eyes 4 (four) times daily.    Marland Kitchen spironolactone (ALDACTONE) 25 MG tablet Take 25 mg by mouth daily.    . SYMBICORT 160-4.5 MCG/ACT inhaler Inhale 2 puffs into the lungs 2 (two) times daily. 1 Inhaler 5  . Tiotropium Bromide Monohydrate (SPIRIVA RESPIMAT) 2.5 MCG/ACT AERS INHALE 2 PUFFS BY MOUTH EVERY DAY (Patient taking differently: Inhale 2 puffs into the lungs daily. ) 4 g 11  . vitamin B-12 (CYANOCOBALAMIN) 1000 MCG tablet Take 1,000 mcg by mouth  daily.      No current facility-administered medications for this visit.    Review of Systems  Constitutional: Positive for fatigue.  HENT: HENT negative.  Eyes: Eyes negative.  Respiratory: Positive for shortness of breath.  GI: Gastrointestinal negative.  Musculoskeletal: Positive for leg pain.  Skin: Positive for wound.  Neurological: Neurological negative. Hematologic: Hematologic/lymphatic negative.  Psychiatric: Positive for depressed mood.        Objective:  Objective   Vitals:   07/25/19 1522  BP: 133/74  Pulse: 70  Resp: 20  Temp: 98.2 F (36.8 C)  SpO2: 91%  Weight: 134 lb (60.8 kg)  Height: 5\' 9"  (1.753 m)   Body mass index is 19.79 kg/m.  Physical Exam HENT:     Nose: Nose normal.     Mouth/Throat:     Pharynx: Oropharynx is clear.  Eyes:     Pupils: Pupils are equal, round, and reactive to light.  Cardiovascular:     Pulses:          Femoral pulses are 0 on the right side and 0 on the left side. Abdominal:     Palpations: Abdomen is soft.  Musculoskeletal:     Cervical back: Normal range of motion.     Right lower leg: Edema present.     Left lower leg: Edema present.  Skin:    Comments: Dressing on right nonstreaking erythema of the right ankle  Neurological:     General: No focal deficit present.     Mental Status: He is alert.  Psychiatric:        Mood and Affect: Mood normal.  Thought Content: Thought content normal.        Judgment: Judgment normal.     Data: No studies today     Assessment/Plan:     89 old male with critical bilateral lower extremity ischemia nonreconstructable vascular disease.  He does have significant pain of the right foot and leg with swelling of both legs.  We have discussed proceeding with above-knee amputation and the inherent risks which the patient is well aware of which include death, coronary artery event, stroke and need for prolonged intubation.  Patient is going to discuss with his family  and will call to schedule above-knee amputation if he decides.     Waynetta Sandy MD [facility]

## 2019-07-25 NOTE — Patient Outreach (Signed)
[facility]) Care Management  07/25/2019  [name] [birth year] [id number]   Call placed to member's wife to follow up on decision for member's plan of care.  No answer, phone rang busy.  Will follow up within the next 3-4 business days.  Valente Kie, [place], MSN [place] [phone]

## 2019-07-25 NOTE — H&P (View-Only) (Signed)
Patient ID: [name], male   DOB: 1929/01/25, 86 y.o.   MRN: [id number]  Reason for Consult: Follow-up   Referred by [name], MD  Subjective:     HPI:  [name] is a 86 y.o. male is here for follow-up after recent hospitalization where he underwent angiogram was found to have no reconstructive options.  He has been discussed for palliative care versus right above-knee amputation.  Patient does have significant anxiety which prevents his decision-making.  He has many questions today he is accompanied by his wife.  I have previously talked to his son on the phone prior to this visit.  He states that he does have significant right lower extremity pain.  He is taking narcotics which do help but also make him very sleepy.  He is able to take a few steps but his right foot is significantly painful with this.  Left leg does have swelling but no wounds at this time.  Wound on the right foot has gotten worse per the wife.  Past Medical History:  Diagnosis Date  . Arthritis    "generalized" (04/04/2017)  . Asthma   . Basal cell carcinoma    "left ear"  . BPH (benign prostatic hyperplasia)    severe; s/p multiple biopsies  . CAD (coronary artery disease)   . Carotid artery occlusion   . Chronic respiratory failure ([place])   . Chronic rhinitis   . COPD (chronic obstructive pulmonary disease) (H[facility])    2L [place] O2  . Diastolic heart failure ([place]) 2019  . Dilation of biliary tract   . Elevated troponin 03/09/2017  . Gallstones   . GERD (gastroesophageal reflux disease)   . History of blood transfusion    "w/his CABG" (04/04/2017)  . History of kidney stones   . Hyperlipidemia   . Hypertension   . On home oxygen therapy    "~ 24/7" (04/04/2017)  . Peripheral vascular disease ([place])   . Pneumonia 2019  . Squamous cell carcinoma of skin 04/23/2013   in situ-Right flank (txpbx)  . Squamous cell carcinoma of skin 01/23/2017   [place]/ in siut- Left ear rim (CX3FU)  . Syncope and  collapse   . Ureteral tumor 08/2015   had endoscopic procedure for evaluation, unable to reach for biopsy   Family History  Problem Relation Age of Onset  . Heart disease Mother   . Heart disease Father        Before age 67  . Breast cancer Mother   . Cancer Mother        Breast cancer  . Hypertension Mother   . Other Mother        AAA  and   Amputation  . Heart attack Father   . Stroke Brother        x3, still living   . Peripheral vascular disease Brother   . Heart disease Brother   . Hyperlipidemia Brother   . Hypertension Brother   . Heart disease Brother   . Allergies Brother    Past Surgical History:  Procedure Laterality Date  . ABDOMINAL AORTOGRAM W/LOWER EXTREMITY Bilateral 06/30/2019   Procedure: ABDOMINAL AORTOGRAM W/LOWER EXTREMITY;  Surgeon: Waynetta Sandy, MD;  Location: [facility];  Service: Cardiovascular;  Laterality: Bilateral;  . BASAL CELL CARCINOMA EXCISION Left    ear  . CARDIAC CATHETERIZATION     "prior to bypass"  . CATARACT EXTRACTION W/ INTRAOCULAR LENS  IMPLANT, BILATERAL Bilateral   .  CORONARY ARTERY BYPASS GRAFT  10/2001   LIMA to LAD, SVG to OM1-2, SVG to RCA and PDA  . LITHOTRIPSY    . PROSTATE BIOPSY  Oct. 2014  . TONSILLECTOMY      Short Social History:  Social History   Tobacco Use  . Smoking status: Former Smoker    Packs/day: 2.00    Years: 33.00    Pack years: 66.00    Types: Cigarettes    Quit date: 03/25/1982    Years since quitting: 37.3  . Smokeless tobacco: Never Used  Substance Use Topics  . Alcohol use: Yes    Alcohol/week: 0.0 standard drinks    Comment: 1.5-3 oz daily in the past, minimal use in the last few months    Allergies  Allergen Reactions  . Cefdinir Diarrhea and Other (See Comments)    Severe Diarrhea  . Nitrofurantoin Swelling and Other (See Comments)    Hand Swelling  . Sulfa Antibiotics Anaphylaxis and Swelling  . Sulfonamide Derivatives Swelling and Other (See Comments)     Facial/tongue swelling  . Tape Other (See Comments)    SKIN IS VERY THIN AND TEARS EASILY!!!!! Please do NOT use "plastic" tape- USE PAPER!!  . Ciprofloxacin Itching, Rash and Other (See Comments)    Red itchy hands  . Nitrofurantoin Swelling and Other (See Comments)    Swollen hands  . Amoxicillin Er Other (See Comments)    Frequest urination  . Doxycycline Other (See Comments)    "Felt terrible"  . Levaquin [Levofloxacin] Itching and Rash  . Sertraline Anxiety and Other (See Comments)    Makes the patient jittery    Current Outpatient Medications  Medication Sig Dispense Refill  . acetaminophen (TYLENOL) 325 MG tablet Take 650 mg by mouth every 6 (six) hours as needed.    . ALPRAZolam (XANAX) 0.25 MG tablet TAKE 1 TABLET BY MOUTH THREE TIMES DAILY AS NEEDED (Patient taking differently: Take 0.125-0.25 mg by mouth 3 (three) times daily as needed for anxiety. ) 90 tablet 3  . aspirin 81 MG tablet Take 81 mg by mouth daily.    Marland Kitchen atorvastatin (LIPITOR) 20 MG tablet TAKE 1 TABLET BY MOUTH EVERY DAY (Patient taking differently: Take 20 mg by mouth at bedtime. ) 90 tablet 2  . azithromycin (ZITHROMAX) 250 MG tablet Zpack taper as directed 6 tablet 0  . azithromycin (ZITHROMAX) 250 MG tablet One daily 30 each 3  . Cholecalciferol (VITAMIN D3) 2000 units TABS Take 2,000 Units by mouth daily with lunch.     . famotidine (PEPCID) 20 MG tablet Take 20 mg by mouth at bedtime.     . finasteride (PROSCAR) 5 MG tablet Take 5 mg by mouth daily with lunch.     . fluticasone (FLONASE) 50 MCG/ACT nasal spray Place 2 sprays into both nostrils daily.    . furosemide (LASIX) 20 MG tablet Take 1 tablet (20 mg total) by mouth 2 (two) times daily. 60 tablet 3  . HYDROmorphone (DILAUDID) 2 MG tablet Take 2 mg by mouth every 6 (six) hours as needed for severe pain.    Marland Kitchen levalbuterol (XOPENEX HFA) 45 MCG/ACT inhaler Inhale 2 puffs into the lungs every 4 (four) hours as needed for wheezing or shortness of breath.  15 g 5  . levalbuterol (XOPENEX) 0.63 MG/3ML nebulizer solution USE 1 VIAL VIA NEBULIZER EVERY 4 HOURS AS NEEDED FOR WHEEZING OR SHORTNESS OF BREATH (Patient taking differently: Take 0.63 mg by nebulization every 4 (four) hours as needed  for wheezing or shortness of breath. ) 300 mL 3  . metoprolol succinate (TOPROL-XL) 25 MG 24 hr tablet Take 0.5 tablets (12.5 mg total) by mouth daily. 45 tablet 3  . mometasone (NASONEX) 50 MCG/ACT nasal spray Place 2 sprays into the nose daily. 17 g 5  . Multiple Vitamins-Minerals (PRESERVISION AREDS 2) CAPS Take 1 capsule by mouth daily.    Marland Kitchen NITROSTAT 0.4 MG SL tablet Place 0.4 mg under the tongue every 5 (five) minutes as needed for chest pain.    . NONFORMULARY OR COMPOUNDED ITEM Apply 1 application topically See admin instructions. Triamcinolone/Cetaphil compounded cream- Apply to both legs once a day    . OXYGEN Inhale 2 L/min into the lungs continuous.     . predniSONE (DELTASONE) 10 MG tablet FOR FLARE OF COUGH/WHEEZING MAY INCREASE TO 2 DAILY BETTER, THEN 1 DAILY (Patient taking differently: Take 10-20 mg by mouth in the morning. Take 10 mg by mouth in the morning and may increase to 20 mg as needed for coughing/wheezing (then decrease back to 10 mg once daily)) 100 tablet 2  . PRESCRIPTION MEDICATION See admin instructions. Philips Trilogy ventilator- At bedtime    . silodosin (RAPAFLO) 4 MG CAPS capsule Take 4 mg by mouth daily.    . sodium chloride (MURO 128) 5 % ophthalmic solution Place 1 drop into both eyes 4 (four) times daily.    Marland Kitchen spironolactone (ALDACTONE) 25 MG tablet Take 25 mg by mouth daily.    . SYMBICORT 160-4.5 MCG/ACT inhaler Inhale 2 puffs into the lungs 2 (two) times daily. 1 Inhaler 5  . Tiotropium Bromide Monohydrate (SPIRIVA RESPIMAT) 2.5 MCG/ACT AERS INHALE 2 PUFFS BY MOUTH EVERY DAY (Patient taking differently: Inhale 2 puffs into the lungs daily. ) 4 g 11  . vitamin B-12 (CYANOCOBALAMIN) 1000 MCG tablet Take 1,000 mcg by mouth  daily.      No current facility-administered medications for this visit.    Review of Systems  Constitutional: Positive for fatigue.  HENT: HENT negative.  Eyes: Eyes negative.  Respiratory: Positive for shortness of breath.  GI: Gastrointestinal negative.  Musculoskeletal: Positive for leg pain.  Skin: Positive for wound.  Neurological: Neurological negative. Hematologic: Hematologic/lymphatic negative.  Psychiatric: Positive for depressed mood.        Objective:  Objective   Vitals:   07/25/19 1522  BP: 133/74  Pulse: 70  Resp: 20  Temp: 98.2 F (36.8 C)  SpO2: 91%  Weight: 134 lb (60.8 kg)  Height: 5\' 9"  (1.753 m)   Body mass index is 19.79 kg/m.  Physical Exam HENT:     Nose: Nose normal.     Mouth/Throat:     Pharynx: Oropharynx is clear.  Eyes:     Pupils: Pupils are equal, round, and reactive to light.  Cardiovascular:     Pulses:          Femoral pulses are 0 on the right side and 0 on the left side. Abdominal:     Palpations: Abdomen is soft.  Musculoskeletal:     Cervical back: Normal range of motion.     Right lower leg: Edema present.     Left lower leg: Edema present.  Skin:    Comments: Dressing on right nonstreaking erythema of the right ankle  Neurological:     General: No focal deficit present.     Mental Status: He is alert.  Psychiatric:        Mood and Affect: Mood normal.  Thought Content: Thought content normal.        Judgment: Judgment normal.     Data: No studies today     Assessment/Plan:     12 old male with critical bilateral lower extremity ischemia nonreconstructable vascular disease.  He does have significant pain of the right foot and leg with swelling of both legs.  We have discussed proceeding with above-knee amputation and the inherent risks which the patient is well aware of which include death, coronary artery event, stroke and need for prolonged intubation.  Patient is going to discuss with his family  and will call to schedule above-knee amputation if he decides.     Waynetta Sandy MD [facility]

## 2019-07-29 ENCOUNTER — Encounter (HOSPITAL_BASED_OUTPATIENT_CLINIC_OR_DEPARTMENT_OTHER): Payer: Medicare Other | Admitting: Internal Medicine

## 2019-07-29 ENCOUNTER — Other Ambulatory Visit: Payer: Self-pay

## 2019-07-29 DIAGNOSIS — I87323 Chronic venous hypertension (idiopathic) with inflammation of bilateral lower extremity: Secondary | ICD-10-CM | POA: Diagnosis not present

## 2019-07-29 DIAGNOSIS — S51811A Laceration without foreign body of right forearm, initial encounter: Secondary | ICD-10-CM | POA: Diagnosis not present

## 2019-07-29 DIAGNOSIS — I70235 Atherosclerosis of native arteries of right leg with ulceration of other part of foot: Secondary | ICD-10-CM | POA: Diagnosis not present

## 2019-07-29 DIAGNOSIS — J449 Chronic obstructive pulmonary disease, unspecified: Secondary | ICD-10-CM | POA: Diagnosis not present

## 2019-07-29 DIAGNOSIS — S51012A Laceration without foreign body of left elbow, initial encounter: Secondary | ICD-10-CM | POA: Diagnosis not present

## 2019-07-29 DIAGNOSIS — L97511 Non-pressure chronic ulcer of other part of right foot limited to breakdown of skin: Secondary | ICD-10-CM | POA: Diagnosis not present

## 2019-07-30 ENCOUNTER — Telehealth: Payer: Self-pay | Admitting: *Deleted

## 2019-07-30 ENCOUNTER — Other Ambulatory Visit: Payer: Self-pay | Admitting: *Deleted

## 2019-07-30 ENCOUNTER — Ambulatory Visit: Payer: Medicare Other | Admitting: Vascular Surgery

## 2019-07-30 DIAGNOSIS — N401 Enlarged prostate with lower urinary tract symptoms: Secondary | ICD-10-CM | POA: Diagnosis not present

## 2019-07-30 DIAGNOSIS — R3914 Feeling of incomplete bladder emptying: Secondary | ICD-10-CM | POA: Diagnosis not present

## 2019-07-30 NOTE — Patient Outreach (Signed)
[facility]) Care Management  07/30/2019  BELEN ZWAHLEN 1929-07-20 [id number]   Outreach attempt #2, unsuccessful.  Call placed to member's wife to follow up on decision for member's plan of care.  No answer, phone rang busy.  Will send unsuccessful outreach letter and follow up within the next 3-4 business days.  Valente Joseff, [place], MSN [place] [phone]

## 2019-07-30 NOTE — Telephone Encounter (Signed)
Called to speak with patient's wife, [name], to schedule a Palliative care home visit. Patient's son, Randall Hiss, answered the phone so visit was scheduled through him for 7/23@1p .

## 2019-07-31 DIAGNOSIS — I70235 Atherosclerosis of native arteries of right leg with ulceration of other part of foot: Secondary | ICD-10-CM | POA: Diagnosis not present

## 2019-07-31 DIAGNOSIS — Z4800 Encounter for change or removal of nonsurgical wound dressing: Secondary | ICD-10-CM | POA: Diagnosis not present

## 2019-07-31 DIAGNOSIS — L03115 Cellulitis of right lower limb: Secondary | ICD-10-CM | POA: Diagnosis not present

## 2019-07-31 DIAGNOSIS — L97519 Non-pressure chronic ulcer of other part of right foot with unspecified severity: Secondary | ICD-10-CM | POA: Diagnosis not present

## 2019-07-31 DIAGNOSIS — L03116 Cellulitis of left lower limb: Secondary | ICD-10-CM | POA: Diagnosis not present

## 2019-07-31 DIAGNOSIS — I7092 Chronic total occlusion of artery of the extremities: Secondary | ICD-10-CM | POA: Diagnosis not present

## 2019-08-01 ENCOUNTER — Other Ambulatory Visit: Payer: Self-pay

## 2019-08-01 ENCOUNTER — Other Ambulatory Visit: Payer: Medicare Other | Admitting: *Deleted

## 2019-08-01 ENCOUNTER — Other Ambulatory Visit: Payer: Medicare Other

## 2019-08-01 DIAGNOSIS — Z515 Encounter for palliative care: Secondary | ICD-10-CM

## 2019-08-01 NOTE — Progress Notes (Signed)
Matt, Trygg [name] ([id number]) Visit Report for 07/29/2019 HPI Details Patient Name: Date of Service: DO RN, [name] 07/29/2019 2:00 PM Medical Record Number: [id number] Patient Account Number: [id number] Date of Birth/Sex: Treating RN: 25-Mar-1929 (86 y.o. [name]) [name] Primary Care Provider: Piedad Climes, CHA RLES Other Clinician: Referring Provider: Treating Provider/Extender: Mare Ferrari, CHA RLES Weeks in Treatment: 14 History of Present Illness HPI Description: 01/30/18 on evaluation today patient presents for initial inspection concerning the skin tear of the right forearm. Fortunately there does not appear to be any evidence of infection and this occurred approximately three days ago. He has been using Vaseline over the region. With that being said the patient does have a history of cataracts COPD, hypertension, peripheral arterial disease, and osteoarthritis. He tells me that he does have a little bit of pain at the site but nothing too significant at this time which is good news. Fortunately this overall appears to be doing fairly well which is good news. No fevers, chills, nausea, or vomiting noted at this time. The biggest issue at this point is that the wound is of quite significant size. READMISSION 04/21/2019 This is a 86 year old man with multiple medical problems who was seen once in the clinic here in January 2020 with a skin tear on his right forearm seen by [name]. This problem this time started sometime recently. He was noted by podiatry to have erosions and wounds of his right foot on toes 1-3. He also saw [name] of dermatology who gave him Silvadene cream for what I think was felt to be a stasis dermatitis of his bilateral lower legs. At some point he was referred to [name]. He was also noted to have previous noninvasive arterial studies that showed no flow to either one of his toes and noncompressible ABIs bilaterally. [name] did noninvasive arterial  studies on him. These showed that he had greater than 75% stenosis in the proximal and mid common femoral artery. Monophasic flow in the posterior tibial and dorsalis pedis positions on the right foot. ABIs again were noncompressible. He was felt to have total occlusion of the superficial femoral artery on the right. The overall interpretation is that he had severe multilevel arterial occlusive disease. Absent femoral pulses and superficial femoral artery occlusions. In spite of this he was not felt to be candidate for arteriography given his bilateral femoral artery occlusions as well he would not be a candidate for endovascular approach secondary to his common femoral artery occlusions and proximal iliac disease. He was not felt to be a candidate for an open infra inguinal bypass. He therefore felt he would need to be monitored over time with the only option being a right below-knee amputation The patient complains currently of pain at night when he is up in bed. The pain in the right foot is better when he puts the leg down over the bed side. This is compatible with rest pain. He has very limited activity i.e. is even exhausted getting dressed in the morning because of cardiopulmonary issues therefore it is difficult to gauge claudication I believe he was referred here by Dr. [name] with one of the major issues is whether he would be a candidate for hyperbaric oxygen. He is not a diabetic. Dr. [name] did give him Silvadene cream for dry flaking skin in his bilateral lower extremities, however the patient is concerned about using this in the face of sulfa allergy. Podiatry had previously given him  in a steroid cream I do not believe these use this either Past medical history is extensive including basal cell and squamous cell skin cancers, combined congestive heart failure, O2 dependent COPD, cor pulmonale, venous stasis dermatitis, peripheral arterial disease, hearing loss, 4/20; the patient still  has a wound on the right first toe. This is on the medial aspect extended there is a second area on the plantar aspect. Most of what the patient talks about is claudication with minimal activity [however the patient is limited by his COPD], or even at rest at night. He is not getting a lot of sleep. We use silver collagen on this wound 5/4; the patient has a wound on the tip of his right great toe also on the medial aspect. Setting of severe nonrevascularizable PAD [see description in HPI]. He does not describe any change in his pain. We have been using silver collagen 5/18; 2-week follow-up. The patient has wound on the tip of his right great toe as well as the medial aspect. He has developed new wounds x2 on the right lateral foot and the right lateral calcaneus. These look like ischemic areas. The patient talks about claudication at night. He seems to be more comfortable during the day. We have been using silver collagen to the wounds. I have previously debrided the eschar on the toes however this comes right back. 6/1; 2-week follow-up. This patient has ischemic wounds on his right foot in the setting of nonunreconstructable PAD [previously reviewed by Dr. Dixon]. He does not really describe severe pain but he does have episodic pain in the right great toe. He has an area on the tip of the right great toe as well as the medial aspect of the right great toe. A new area on the tip of the right fourth toe. He has the area on the right lateral foot fortunately the area on the right lateral heel appears to have epithelialized over. We are using silver collagen to all wounds 06/23/2019 upon evaluation today patient appears to be doing about the same in regard to his foot ulcers. Unfortunately he has 2 new skin tears on his left elbow and the right forearm that were new as of today. He states that it was doing okay until they went to change the dressing and they put some collagen on it that got stuck and  somewhat pulled back the skin unfortunately. Fortunately there is no signs of active infection at this time. No fevers, chills, nausea, vomiting, or diarrhea. 6/29; the patient was hospitalized from 6/18 through 6/23 going in with increasing drainage from the wounds on his right foot. He underwent a angiogram on 06/30/2019 by Dr. Donzetta Matters which revealed multilevel arterial disease including occlusion of the right common femoral artery. He reconstitutes the profunda femoris and the below-knee popliteal artery and appears to have posterior tibial peroneal runoff to the ankle although there is heavy calcification of the popliteal below the knee. He was offered consideration of a right above-knee amputation, palliative and/or hospice care and consideration of a very high risk attempt at bypass. He did receive IV antibiotics. He comes in the clinic today again with the 2 areas that are necrotic on the left great toe and a small punched out area on the right lateral foot. A lot of the erythema on top of the right foot but no warmth. He has a lot of swelling here. The patient is literally tortured over this decision that he needs to make. I went over  this with him in exceptional detail. I do not believe he is a candidate for any form of open bypass surgery. I think his vascular doctors would agree with that. He still has pain at night when he puts his leg up on the bed that is relieved by putting his leg dependent. This is not different from what I remember. He is not septic there is no additional skin breakdown that I can see 7/8 the patient continues to have severe pain in the right foot at night making it difficult for him to rest. There is increasing erythema in the right foot but the foot is cold. I do not think this is an infection I think this is likely to be tissue necrosis from ischemia. I thought he would have made an up decision today about going forward with hospice he has not. He did not go to see Dr.  Doren Custard on 7/6. Again he wants to talk to me at length about amputations. I do not think he will do well after an above-knee amputation on the right. Furthermore I do not think he is a candidate for bypass surgery on the right leg 7/20; patient still having a lot of pain in the right foot. He saw Dr. Donzetta Matters on 7/16. He is not felt to be able to undergo a complex revascularization and I certainly agree with that. He was offered a right below-knee amputation but he has not made that decision. I have repetitively suggested hospice care if he will not undergo an amputation he is not decided he wants to go that route either. He arrives in the clinic today accompanied by his son who is a Stage manager and his wife. I think the patient has tissue breakdown in the dorsal foot secondary to ischemia although his son brought up that some of the erythema could be a contact dermatitis as they were apparently layering a large area of pure call over the wound area. I was not aware of this not supposed to be doing it in this fashion. In any case the erythema look better than last week and I am quite convinced this is not cellulitis Electronic Signature(s) Signed: 07/29/2019 6:00:01 PM By: [name] MD Entered By: [name] on 07/29/2019 17:32:15 -------------------------------------------------------------------------------- Physical Exam Details Patient Name: Date of Service: DO RN, [name] 07/29/2019 2:00 PM Medical Record Number: [id number] Patient Account Number: [id number] Date of Birth/Sex: Treating RN: 06-May-1929 (86 y.o. [name] Primary Care Provider: Piedad Climes, CHA RLES Other Clinician: Referring Provider: Treating Provider/Extender: Mare Ferrari, CHA RLES Weeks in Treatment: 14 Constitutional Patient is hypertensive.. Pulse regular and within target range for patient.Marland Kitchen Respirations regular, non-labored and within target range.. Temperature is normal and within the target range  for the patient.Marland Kitchen Appears in no distress. Notes Wound exam The right great toe wounds are about the same. He has a punched-out area on the right lateral foot and several areas of skin breakdown on the dorsal foot. Erythema from last time seems better this could be a contact dermatitis I previously thought that this was ischemia/necrosis. Electronic Signature(s) Signed: 07/29/2019 6:00:01 PM By: [name] MD Entered By: [name] on 07/29/2019 17:33:07 -------------------------------------------------------------------------------- Physician Orders Details Patient Name: Date of Service: DO RN, [name] 07/29/2019 2:00 PM Medical Record Number: [id number] Patient Account Number: [id number] Date of Birth/Sex: Treating RN: [birth year] (86 y.o. [name]) [name] Primary Care Provider: Piedad Climes, CHA RLES Other Clinician: Referring Provider: Treating Provider/Extender: [name]  RO SS, CHA RLES Weeks in Treatment: 14 Verbal / Phone Orders: No Diagnosis Coding ICD-10 Coding Code Description I70.235 Atherosclerosis of native arteries of right leg with ulceration of other part of foot L97.511 Non-pressure chronic ulcer of other part of right foot limited to breakdown of skin I87.323 Chronic venous hypertension (idiopathic) with inflammation of bilateral lower extremity S51.801A Unspecified open wound of right forearm, initial encounter S51.002A Unspecified open wound of left elbow, initial encounter Follow-up Appointments Return appointment in 1 month. Dressing Change Frequency Wound #2 Right T Great oe Change Dressing every other day. Wound #4 Right,Medial T Great oe Change Dressing every other day. Wound #6 Right,Distal,Lateral Foot Other: - 2 times per week Skin Barriers/Peri-Wound Care Moisturizing lotion - to both legs and feet daily TCA Cream or Ointment Wound Cleansing May shower and wash wound with soap and water. - on days that dressing is changed Primary Wound  Dressing Wound #2 Right T Great oe Other: - paint with betadine Wound #4 Right,Medial T Great oe Other: - paint with betadine Wound #6 Right,Distal,Lateral Foot Silver Collagen - prisma only - moisten with hydrogel or [place] jelly Secondary Dressing Wound #2 Right T Great oe Foam Kerlix/Rolled Gauze Dry Gauze Wound #4 Right,Medial T Great oe Kerlix/Rolled Gauze Dry Gauze Wound #6 Right,Distal,Lateral Foot Kerlix/Rolled Gauze Dry Gauze Heel Cup Off-Loading Open toe surgical shoe to: - right foot [place] [facility] for wound care. - ADVANCE Electronic Signature(s) Signed: 07/29/2019 6:00:01 PM By: [name] MD Signed: 08/01/2019 5:42:58 PM By: [name] RN Entered By: [name] on 07/29/2019 15:41:20 -------------------------------------------------------------------------------- Problem List Details Patient Name: Date of Service: DO RN, [name] 07/29/2019 2:00 PM Medical Record Number: [id number] Patient Account Number: [id number] Date of Birth/Sex: Treating RN: [birth year] (86 y.o. [name]) [name] Primary Care Provider: Piedad Climes, CHA RLES Other Clinician: Referring Provider: Treating Provider/Extender: Mare Ferrari, CHA RLES Weeks in Treatment: 14 Active Problems ICD-10 Encounter Code Description Active Date MDM Diagnosis I70.235 Atherosclerosis of native arteries of right leg with ulceration of other part of 04/21/2019 No Yes foot L97.511 Non-pressure chronic ulcer of other part of right foot limited to breakdown of 04/21/2019 No Yes skin I87.323 Chronic venous hypertension (idiopathic) with inflammation of bilateral lower 04/21/2019 No Yes extremity S51.801A Unspecified open wound of right forearm, initial encounter 06/23/2019 No Yes S51.002A Unspecified open wound of left elbow, initial encounter 06/23/2019 No Yes Inactive Problems Resolved Problems Electronic Signature(s) Signed: 07/29/2019 6:00:01 PM By: [name] MD Entered By: [name] on 07/29/2019 17:28:15 -------------------------------------------------------------------------------- Progress Note Details Patient Name: Date of Service: DO RN, [name] 07/29/2019 2:00 PM Medical Record Number: [id number] Patient Account Number: [id number] Date of Birth/Sex: Treating RN: 05-11-1929 (86 y.o. [name]) [name] Primary Care Provider: Piedad Climes, CHA RLES Other Clinician: Referring Provider: Treating Provider/Extender: Mare Ferrari, CHA RLES Weeks in Treatment: 14 Subjective History of Present Illness (HPI) 01/30/18 on evaluation today patient presents for initial inspection concerning the skin tear of the right forearm. Fortunately there does not appear to be any evidence of infection and this occurred approximately three days ago. He has been using Vaseline over the region. With that being said the patient does have a history of cataracts COPD, hypertension, peripheral arterial disease, and osteoarthritis. He tells me that he does have a little bit of pain at the site but nothing too significant at this time which is good news. Fortunately this overall appears to  be doing fairly well which is good news. No fevers, chills, nausea, or vomiting noted at this time. The biggest issue at this point is that the wound is of quite significant size. READMISSION 04/21/2019 This is a 86 year old man with multiple medical problems who was seen once in the clinic here in January 2020 with a skin tear on his right forearm seen by [name]. This problem this time started sometime recently. He was noted by podiatry to have erosions and wounds of his right foot on toes 1-3. He also saw [name] of dermatology who gave him Silvadene cream for what I think was felt to be a stasis dermatitis of his bilateral lower legs. At some point he was referred to [name]. He was also noted to have previous noninvasive arterial studies that showed no flow to  either one of his toes and noncompressible ABIs bilaterally. [name] did noninvasive arterial studies on him. These showed that he had greater than 75% stenosis in the proximal and mid common femoral artery. Monophasic flow in the posterior tibial and dorsalis pedis positions on the right foot. ABIs again were noncompressible. He was felt to have total occlusion of the superficial femoral artery on the right. The overall interpretation is that he had severe multilevel arterial occlusive disease. Absent femoral pulses and superficial femoral artery occlusions. In spite of this he was not felt to be candidate for arteriography given his bilateral femoral artery occlusions as well he would not be a candidate for endovascular approach secondary to his common femoral artery occlusions and proximal iliac disease. He was not felt to be a candidate for an open infra inguinal bypass. He therefore felt he would need to be monitored over time with the only option being a right below-knee amputation The patient complains currently of pain at night when he is up in bed. The pain in the right foot is better when he puts the leg down over the bed side. This is compatible with rest pain. He has very limited activity i.e. is even exhausted getting dressed in the morning because of cardiopulmonary issues therefore it is difficult to gauge claudication I believe he was referred here by Dr. [name] with one of the major issues is whether he would be a candidate for hyperbaric oxygen. He is not a diabetic. Dr. [name] did give him Silvadene cream for dry flaking skin in his bilateral lower extremities, however the patient is concerned about using this in the face of sulfa allergy. Podiatry had previously given him in a steroid cream I do not believe these use this either Past medical history is extensive including basal cell and squamous cell skin cancers, combined congestive heart failure, O2 dependent COPD, cor  pulmonale, venous stasis dermatitis, peripheral arterial disease, hearing loss, 4/20; the patient still has a wound on the right first toe. This is on the medial aspect extended there is a second area on the plantar aspect. Most of what the patient talks about is claudication with minimal activity [however the patient is limited by his COPD], or even at rest at night. He is not getting a lot of sleep. We use silver collagen on this wound 5/4; the patient has a wound on the tip of his right great toe also on the medial aspect. Setting of severe nonrevascularizable PAD [see description in HPI]. He does not describe any change in his pain. We have been using silver collagen 5/18; 2-week follow-up. The patient has wound on the tip  of his right great toe as well as the medial aspect. He has developed new wounds x2 on the right lateral foot and the right lateral calcaneus. These look like ischemic areas. The patient talks about claudication at night. He seems to be more comfortable during the day. We have been using silver collagen to the wounds. I have previously debrided the eschar on the toes however this comes right back. 6/1; 2-week follow-up. This patient has ischemic wounds on his right foot in the setting of nonunreconstructable PAD [previously reviewed by Dr. Dixon]. He does not really describe severe pain but he does have episodic pain in the right great toe. He has an area on the tip of the right great toe as well as the medial aspect of the right great toe. A new area on the tip of the right fourth toe. He has the area on the right lateral foot fortunately the area on the right lateral heel appears to have epithelialized over. We are using silver collagen to all wounds 06/23/2019 upon evaluation today patient appears to be doing about the same in regard to his foot ulcers. Unfortunately he has 2 new skin tears on his left elbow and the right forearm that were new as of today. He states that it  was doing okay until they went to change the dressing and they put some collagen on it that got stuck and somewhat pulled back the skin unfortunately. Fortunately there is no signs of active infection at this time. No fevers, chills, nausea, vomiting, or diarrhea. 6/29; the patient was hospitalized from 6/18 through 6/23 going in with increasing drainage from the wounds on his right foot. He underwent a angiogram on 06/30/2019 by Dr. Donzetta Matters which revealed multilevel arterial disease including occlusion of the right common femoral artery. He reconstitutes the profunda femoris and the below-knee popliteal artery and appears to have posterior tibial peroneal runoff to the ankle although there is heavy calcification of the popliteal below the knee. He was offered consideration of a right above-knee amputation, palliative and/or hospice care and consideration of a very high risk attempt at bypass. He did receive IV antibiotics. He comes in the clinic today again with the 2 areas that are necrotic on the left great toe and a small punched out area on the right lateral foot. A lot of the erythema on top of the right foot but no warmth. He has a lot of swelling here. The patient is literally tortured over this decision that he needs to make. I went over this with him in exceptional detail. I do not believe he is a candidate for any form of open bypass surgery. I think his vascular doctors would agree with that. He still has pain at night when he puts his leg up on the bed that is relieved by putting his leg dependent. This is not different from what I remember. He is not septic there is no additional skin breakdown that I can see 7/8 the patient continues to have severe pain in the right foot at night making it difficult for him to rest. There is increasing erythema in the right foot but the foot is cold. I do not think this is an infection I think this is likely to be tissue necrosis from ischemia. I thought he  would have made an up decision today about going forward with hospice he has not. He did not go to see [name] on 7/6. Again he wants to talk to me at  length about amputations. I do not think he will do well after an above-knee amputation on the right. Furthermore I do not think he is a candidate for bypass surgery on the right leg 7/20; patient still having a lot of pain in the right foot. He saw Dr. Donzetta Matters on 7/16. He is not felt to be able to undergo a complex revascularization and I certainly agree with that. He was offered a right below-knee amputation but he has not made that decision. I have repetitively suggested hospice care if he will not undergo an amputation he is not decided he wants to go that route either. He arrives in the clinic today accompanied by his son who is a Stage manager and his wife. I think the patient has tissue breakdown in the dorsal foot secondary to ischemia although his son brought up that some of the erythema could be a contact dermatitis as they were apparently layering a large area of pure call over the wound area. I was not aware of this not supposed to be doing it in this fashion. In any case the erythema look better than last week and I am quite convinced this is not cellulitis Objective Constitutional Patient is hypertensive.. Pulse regular and within target range for patient.Marland Kitchen Respirations regular, non-labored and within target range.. Temperature is normal and within the target range for the patient.Marland Kitchen Appears in no distress. Vitals Time Taken: 2:38 PM, Height: 69 in, Source: Stated, Weight: 132 lbs, Source: Stated, BMI: 19.5, Temperature: 98.1 F, Pulse: 109 bpm, Respiratory Rate: 18 breaths/min, Blood Pressure: 151/76 mmHg. General Notes: Wound exam ooThe right great toe wounds are about the same. He has a punched-out area on the right lateral foot and several areas of skin breakdown on the dorsal foot. Erythema from last time seems better this could be a  contact dermatitis I previously thought that this was ischemia/necrosis. Integumentary (Hair, Skin) Wound #11 status is Open. Original cause of wound was Gradually Appeared. The wound is located on the Right,Proximal,Lateral Foot. The wound measures 1.1cm length x 0.5cm width x 0.1cm depth; 0.432cm^2 area and 0.043cm^3 volume. There is Fat Layer (Subcutaneous Tissue) Exposed exposed. There is no tunneling or undermining noted. There is a small amount of serous drainage noted. The wound margin is flat and intact. There is no granulation within the wound bed. There is a large (67-100%) amount of necrotic tissue within the wound bed including Adherent Slough. Wound #12 status is Open. Original cause of wound was Gradually Appeared. The wound is located on the Right,Dorsal Foot. The wound measures 7.5cm length x 6.2cm width x 0.1cm depth; 36.521cm^2 area and 3.652cm^3 volume. There is Fat Layer (Subcutaneous Tissue) Exposed exposed. There is no tunneling or undermining noted. There is a medium amount of serous drainage noted. The wound margin is flat and intact. There is no granulation within the wound bed. There is a large (67-100%) amount of necrotic tissue within the wound bed including Adherent Slough. Wound #13 status is Open. Original cause of wound was Gradually Appeared. The wound is located on the Right,Lateral Ankle. The wound measures 1.5cm length x 1.4cm width x 0.1cm depth; 1.649cm^2 area and 0.165cm^3 volume. There is Fat Layer (Subcutaneous Tissue) Exposed exposed. There is no tunneling or undermining noted. There is a small amount of serous drainage noted. The wound margin is flat and intact. There is no granulation within the wound bed. There is a large (67-100%) amount of necrotic tissue within the wound bed including Adherent Slough. Wound #2  status is Open. Original cause of wound was Gradually Appeared. The wound is located on the Right T Great. The wound measures 0.3cm length  x oe 0.4cm width x 0.1cm depth; 0.094cm^2 area and 0.009cm^3 volume. There is no tunneling or undermining noted. There is a none present amount of drainage noted. The wound margin is flat and intact. There is no granulation within the wound bed. There is a large (67-100%) amount of necrotic tissue within the wound bed including Eschar. Wound #4 status is Open. Original cause of wound was Gradually Appeared. The wound is located on the Right,Medial T Great. The wound measures 1.4cm oe length x 1.3cm width x 0.1cm depth; 1.429cm^2 area and 0.143cm^3 volume. There is no tunneling or undermining noted. There is a small amount of serous drainage noted. The wound margin is flat and intact. There is no granulation within the wound bed. There is a large (67-100%) amount of necrotic tissue within the wound bed including Eschar. Wound #6 status is Open. Original cause of wound was Gradually Appeared. The wound is located on the Right,Distal,Lateral Foot. The wound measures 1.6cm length x 0.9cm width x 0.1cm depth; 1.131cm^2 area and 0.113cm^3 volume. There is Fat Layer (Subcutaneous Tissue) Exposed exposed. There is no tunneling or undermining noted. There is a medium amount of serous drainage noted. The wound margin is flat and intact. There is no granulation within the wound bed. There is a large (67-100%) amount of necrotic tissue within the wound bed including Adherent Slough. Assessment Active Problems ICD-10 Atherosclerosis of native arteries of right leg with ulceration of other part of foot Non-pressure chronic ulcer of other part of right foot limited to breakdown of skin Chronic venous hypertension (idiopathic) with inflammation of bilateral lower extremity Unspecified open wound of right forearm, initial encounter Unspecified open wound of left elbow, initial encounter Plan Follow-up Appointments: Return appointment in 1 month. Dressing Change Frequency: Wound #2 Right T Great: oe Change  Dressing every other day. Wound #4 Right,Medial T Great: oe Change Dressing every other day. Wound #6 Right,Distal,Lateral Foot: Other: - 2 times per week Skin Barriers/Peri-Wound Care: Moisturizing lotion - to both legs and feet daily TCA Cream or Ointment Wound Cleansing: May shower and wash wound with soap and water. - on days that dressing is changed Primary Wound Dressing: Wound #2 Right T Great: oe Other: - paint with betadine Wound #4 Right,Medial T Great: oe Other: - paint with betadine Wound #6 Right,Distal,Lateral Foot: Silver Collagen - prisma only - moisten with hydrogel or KY jelly Secondary Dressing: Wound #2 Right T Great: oe Foam Kerlix/Rolled Gauze Dry Gauze Wound #4 Right,Medial T Great: oe Kerlix/Rolled Gauze Dry Gauze Wound #6 Right,Distal,Lateral Foot: Kerlix/Rolled Gauze Dry Gauze Heel Cup Off-Loading: Open toe surgical shoe to: - right foot Home Health: [facility] for wound care. - ADVANCE 1 I would like Collagen only on the wound areas not over the entire foot. 2. I doubt this is a severe contact dermatitis but his son's point is well taken 3. I am doubtful this patient will be able to make a decision 1 way or the other. He is in a lot of pain he is receiving narcotics but apparently they are concerned about side effects i.e. drowsiness etc. I do not think the patient needs to be followed here although I think it would be reasonable if they want to come back and discuss things in a month. Electronic Signature(s) Signed: 07/29/2019 6:00:01 PM By: [name] MD  Entered By: [name] on 07/29/2019 17:35:04 -------------------------------------------------------------------------------- SuperBill Details Patient Name: Date of Service: DO RN, [name] 07/29/2019 Medical Record Number: [id number] Patient Account Number: [id number] Date of Birth/Sex: Treating RN: 1929/03/27 (86 y.o. [name]) [name]  Provider: Piedad Climes, CHA RLES Other Clinician: Referring Provider: Treating Provider/Extender: Mare Ferrari, CHA RLES Weeks in Treatment: 14 Diagnosis Coding ICD-10 Codes Code Description I70.235 Atherosclerosis of native arteries of right leg with ulceration of other part of foot L97.511 Non-pressure chronic ulcer of other part of right foot limited to breakdown of skin I87.323 Chronic venous hypertension (idiopathic) with inflammation of bilateral lower extremity S51.801A Unspecified open wound of right forearm, initial encounter S51.002A Unspecified open wound of left elbow, initial encounter Facility Procedures CPT4 Code: [id number] Description: [phone] - WOUND CARE VISIT-LEV 5 EST PT Modifier: Quantity: 1 Physician Procedures : CPT4 Code Description Modifier [id number] - WC PHYS LEVEL 3 - EST PT ICD-10 Diagnosis Description I70.235 Atherosclerosis of native arteries of right leg with ulceration of other part of foot L97.511 Non-pressure chronic ulcer of other part of right  foot limited to breakdown of skin I87.323 Chronic venous hypertension (idiopathic) with inflammation of bilateral lower extremity Quantity: 1 Electronic Signature(s) Signed: 07/29/2019 6:00:01 PM By: [name] MD Entered By: [name] on 07/29/2019 17:35:23

## 2019-08-01 NOTE — Progress Notes (Addendum)
[place], Davit [name] ([id number]) Visit Report for 07/29/2019 Arrival Information Details Patient Name: Date of Service: DO RN, [place] V ID L. 07/29/2019 2:00 PM Medical Record Number: [id number] Patient Account Number: [id number] Date of Birth/Sex: Treating RN: [birth year] (86 y.o. [name] Primary Care Brandis Wixted: Piedad Climes, CHA RLES Other Clinician: Referring Jamesia Linnen: Treating Raini Tiley/Extender: Mare Ferrari, CHA RLES Weeks in Treatment: 14 Visit Information History Since Last Visit Added or deleted any medications: Yes Patient Arrived: Wheel Chair Any new allergies or adverse reactions: No Arrival Time: 14:26 Had a fall or experienced change in No Accompanied By: spouse, son activities of daily living that may affect Transfer Assistance: None risk of falls: Patient Identification Verified: Yes Signs or symptoms of abuse/neglect since No Secondary Verification Process Completed: Yes last visito Patient Requires Transmission-Based Precautions: No Hospitalized since last visit: No Patient Has Alerts: Yes Implantable device outside of the clinic No Patient Alerts: Right ABI: [place], TBI: 0 excluding Left ABI: [place], TBI:0 cellular tissue based products placed in the center since last visit: Has Dressing in Place as Prescribed: Yes Has Footwear/Offloading in Place as Yes Prescribed: Right: Surgical Shoe with Pressure Relief Insole Pain Present Now: Yes Electronic Signature(s) Signed: 07/29/2019 6:10:08 PM By: Baruch Gouty RN, BSN Entered By: Baruch Gouty on 07/29/2019 14:28:06 -------------------------------------------------------------------------------- Clinic Level of Care Assessment Details Patient Name: Date of Service: DO RN, [name] 07/29/2019 2:00 PM Medical Record Number: [id number] Patient Account Number: [id number] Date of Birth/Sex: Treating RN: 09-21-1929 (86 y.o. [name]) [name] Primary Care Julieanna Geraci: Piedad Climes, CHA RLES Other Clinician: Referring  Jamaurion Slemmer: Treating Emeril Stille/Extender: Mare Ferrari, CHA RLES Weeks in Treatment: 14 Clinic Level of Care Assessment Items TOOL 4 Quantity Score X- 1 0 Use when only an EandM is performed on FOLLOW-UP visit ASSESSMENTS - Nursing Assessment / Reassessment X- 1 10 Reassessment of Co-morbidities (includes updates in patient status) X- 1 5 Reassessment of Adherence to Treatment Plan ASSESSMENTS - Wound and Skin A ssessment / Reassessment []  - 0 Simple Wound Assessment / Reassessment - one wound X- 6 5 Complex Wound Assessment / Reassessment - multiple wounds []  - 0 Dermatologic / Skin Assessment (not related to wound area) ASSESSMENTS - Focused Assessment []  - 0 Circumferential Edema Measurements - multi extremities []  - 0 Nutritional Assessment / Counseling / Intervention []  - 0 Lower Extremity Assessment (monofilament, tuning fork, pulses) []  - 0 Peripheral Arterial Disease Assessment (using hand held doppler) ASSESSMENTS - Ostomy and/or Continence Assessment and Care []  - 0 Incontinence Assessment and Management []  - 0 Ostomy Care Assessment and Management (repouching, etc.) PROCESS - Coordination of Care X - Simple Patient / Family Education for ongoing care 1 15 []  - 0 Complex (extensive) Patient / Family Education for ongoing care X- 1 10 Staff obtains Programmer, systems, Records, T Results / Process Orders est []  - 0 Staff telephones HHA, Nursing Homes / Clarify orders / etc []  - 0 Routine Transfer to another Facility (non-emergent condition) []  - 0 Routine Hospital Admission (non-emergent condition) []  - 0 New Admissions / Biomedical engineer / Ordering NPWT Apligraf, etc. , []  - 0 Emergency Hospital Admission (emergent condition) X- 1 10 Simple Discharge Coordination []  - 0 Complex (extensive) Discharge Coordination PROCESS - Special Needs []  - 0 Pediatric / Minor Patient Management []  - 0 Isolation Patient Management []  - 0 Hearing / Language /  Visual special needs []  - 0 Assessment of Community assistance (transportation, D/C planning, etc.) []  - 0 Additional assistance /  Altered mentation []  - 0 Support Surface(s) Assessment (bed, cushion, seat, etc.) INTERVENTIONS - Wound Cleansing / Measurement []  - 0 Simple Wound Cleansing - one wound X- 6 5 Complex Wound Cleansing - multiple wounds X- 1 5 Wound Imaging (photographs - any number of wounds) []  - 0 Wound Tracing (instead of photographs) []  - 0 Simple Wound Measurement - one wound X- 6 5 Complex Wound Measurement - multiple wounds INTERVENTIONS - Wound Dressings []  - 0 Small Wound Dressing one or multiple wounds X- 1 15 Medium Wound Dressing one or multiple wounds []  - 0 Large Wound Dressing one or multiple wounds X- 1 5 Application of Medications - topical []  - 0 Application of Medications - injection INTERVENTIONS - Miscellaneous []  - 0 External ear exam []  - 0 Specimen Collection (cultures, biopsies, blood, body fluids, etc.) []  - 0 Specimen(s) / Culture(s) sent or taken to Lab for analysis []  - 0 Patient Transfer (multiple staff / Civil Service fast streamer / Similar devices) []  - 0 Simple Staple / Suture removal (25 or less) []  - 0 Complex Staple / Suture removal (26 or more) []  - 0 Hypo / Hyperglycemic Management (close monitor of Blood Glucose) []  - 0 Ankle / Brachial Index (ABI) - do not check if billed separately X- 1 5 Vital Signs Has the patient been seen at the hospital within the last three years: Yes Total Score: 170 Level Of Care: New/Established - Level 5 Electronic Signature(s) Signed: 08/01/2019 5:42:58 PM By: [name] RN Entered By: [name] on 07/29/2019 15:49:28 -------------------------------------------------------------------------------- Encounter Discharge Information Details Patient Name: Date of Service: DO RN, [name] 07/29/2019 2:00 PM Medical Record Number: [id number] Patient Account Number: [id number] Date of Birth/Sex:  Treating RN: 29-Dec-1929 (86 y.o. [name] Primary Care Priscille Shadduck: Piedad Climes, CHA RLES Other Clinician: Referring Myles Tavella: Treating Laekyn Rayos/Extender: Mare Ferrari, CHA RLES Weeks in Treatment: 14 Encounter Discharge Information Items Discharge Condition: Stable Ambulatory Status: Wheelchair Discharge Destination: Home Transportation: Private Auto Accompanied By: wife Schedule Follow-up Appointment: Yes Clinical Summary of Care: Patient Declined Electronic Signature(s) Signed: 07/29/2019 5:24:17 PM By: Kela Millin Entered By: Kela Millin on 07/29/2019 16:23:38 -------------------------------------------------------------------------------- Lower Extremity Assessment Details Patient Name: Date of Service: DO RN, [name] 07/29/2019 2:00 PM Medical Record Number: [id number] Patient Account Number: [id number] Date of Birth/Sex: Treating RN: 1929/05/20 (86 y.o. [name] Primary Care Rosealie Reach: Piedad Climes, CHA RLES Other Clinician: Referring Hayleigh Bawa: Treating Genecis Veley/Extender: Mare Ferrari, CHA RLES Weeks in Treatment: 14 Edema Assessment Assessed: [Left: No] [Right: No] Edema: [Left: Yes] [Right: Yes] Calf Left: Right: Point of Measurement: cm From Medial Instep cm 34.4 cm Ankle Left: Right: Point of Measurement: cm From Medial Instep cm 26.4 cm Vascular Assessment Pulses: Dorsalis Pedis Palpable: [Right:Yes] Electronic Signature(s) Signed: 07/29/2019 6:10:08 PM By: Baruch Gouty RN, BSN Entered By: Baruch Gouty on 07/29/2019 14:35:17 -------------------------------------------------------------------------------- Multi Wound Chart Details Patient Name: Date of Service: DO RN, [name] 07/29/2019 2:00 PM Medical Record Number: [id number] Patient Account Number: [id number] Date of Birth/Sex: Treating RN: 10-Oct-1929 (86 y.o. [name]) [name] Primary Care Armelia Penton: Piedad Climes, CHA RLES Other Clinician: Referring  Branston Halsted: Treating Tyton Abdallah/Extender: Mare Ferrari, CHA RLES Weeks in Treatment: 14 Vital Signs Height(in): 69 Pulse(bpm): 109 Weight(lbs): 132 Blood Pressure(mmHg): 151/76 Body Mass Index(BMI): 19 Temperature(F): 98.1 Respiratory Rate(breaths/min): 18 Photos: [11:No Photos Right, Proximal, Lateral Foot] [12:No Photos Right, Dorsal Foot] [13:No Photos Right, Lateral Ankle] Wound Location: [11:Gradually Appeared] [12:Gradually Appeared] [  13:Gradually Appeared] Wounding Event: [11:Arterial Insufficiency Ulcer] [12:Arterial Insufficiency Ulcer] [13:Arterial Insufficiency Ulcer] Primary Etiology: [11:N/A] [12:Lymphedema] [13:N/A] Secondary Etiology: [11:Cataracts, Asthma, Chronic] [12:Cataracts, Asthma, Chronic] [13:Cataracts, Asthma, Chronic] Comorbid History: [11:Obstructive Pulmonary Disease (COPD), Congestive Heart Failure, Coronary Artery Disease, Hypertension, Peripheral Arterial Disease, Osteoarthritis, Confinement Disease, Osteoarthritis, Confinement Disease, Osteoarthritis,  Confinement Anxiety 06/30/2019] [12:Obstructive Pulmonary Disease (COPD), Congestive Heart Failure, Coronary Artery Disease, Hypertension, Peripheral Arterial Anxiety 06/30/2019] [13:Obstructive Pulmonary Disease (COPD), Congestive Heart Failure, Coronary  Artery Disease, Hypertension, Peripheral Arterial Anxiety 06/30/2019] Date Acquired: [11:0] [12:0] [13:0] Weeks of Treatment: [11:Open] [12:Open] [13:Open] Wound Status: [11:1.1x0.5x0.1] [12:7.5x6.2x0.1] [13:1.5x1.4x0.1] Measurements L x W x D (cm) [11:0.432] [12:36.521] [13:1.649] A (cm) : rea [11:0.043] [12:3.652] [13:0.165] Volume (cm) : [11:N/A] [12:N/A] [13:N/A] % Reduction in Area: [11:N/A] [12:N/A] [13:N/A] % Reduction in Volume: [11:Full Thickness Without Exposed] [12:Full Thickness Without Exposed] [13:Full Thickness Without Exposed] Classification: [11:Support Structures Small] [12:Support Structures Medium] [13:Support Structures  Small] Exudate A mount: [11:Serous] [12:Serous] [13:Serous] Exudate Type: [11:amber] [12:amber] [13:amber] Exudate Color: [11:Flat and Intact] [12:Flat and Intact] [13:Flat and Intact] Wound Margin: [11:None Present (0%)] [12:None Present (0%)] [13:None Present (0%)] Granulation Amount: [11:Large (67-100%)] [12:Large (67-100%)] [13:Large (67-100%)] Necrotic Amount: [11:Adherent Slough] [12:Adherent Slough] [13:Adherent Slough] Necrotic Tissue: [11:Fat Layer (Subcutaneous Tissue): Yes Fat Layer (Subcutaneous Tissue): Yes Fat Layer (Subcutaneous Tissue): Yes] Exposed Structures: [11:Fascia: No Tendon: No Muscle: No Joint: No Bone: No Small (1-33%)] [12:Fascia: No Tendon: No Muscle: No Joint: No Bone: No Medium (34-66%)] [13:Fascia: No Tendon: No Muscle: No Joint: No Bone: No Small (1-33%)] Wound Number: 2 4 6  Photos: Photos: No Photos No Photos No Photos Right T Great oe Right, Medial T Great oe Right, Distal, Lateral Foot Wound Location: Gradually Appeared Gradually Appeared Gradually Appeared Wounding Event: 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 Primary Etiology: N/A N/A N/A Secondary Etiology: Cataracts, Asthma, Chronic Cataracts, Asthma, Chronic Cataracts, Asthma, Chronic Comorbid History: Obstructive Pulmonary Disease Obstructive Pulmonary Disease Obstructive Pulmonary Disease (COPD), Congestive Heart Failure, (COPD), Congestive Heart Failure, (COPD), Congestive Heart Failure, Coronary Artery Disease, Coronary Artery Disease, Coronary Artery Disease, Hypertension, Peripheral Arterial Hypertension, Peripheral Arterial Hypertension, Peripheral Arterial Disease, Osteoarthritis, ConfinementDisease, Osteoarthritis, Confinement Disease, Osteoarthritis, Confinement Anxiety Anxiety Anxiety 03/31/2019 05/13/2019 05/23/2019 Date Acquired: 14 11 9  Weeks of Treatment: Open Open Open Wound Status: 0.3x0.4x0.1 1.4x1.3x0.1  1.6x0.9x0.1 Measurements L x W x D (cm) 0.094 1.429 1.131 A (cm) : rea 0.009 0.143 0.113 Volume (cm) : 92.70% 7.10% -260.20% % Reduction in Area: 93.10% 7.10% -264.50% % Reduction in Volume: Unclassifiable Full Thickness Without Exposed Unclassifiable Classification: Support Structures None Present Small Medium Exudate A mount: N/A Serous Serous Exudate Type: N/A amber amber Exudate Color: Flat and Intact Flat and Intact Flat and Intact Wound Margin: None Present (0%) None Present (0%) None Present (0%) Granulation Amount: Large (67-100%) Large (67-100%) Large (67-100%) Necrotic Amount: Eschar Eschar Adherent Slough Necrotic Tissue: Fascia: No Fascia: No Fat Layer (Subcutaneous Tissue): Yes Exposed Structures: Fat Layer (Subcutaneous Tissue): No Fat Layer (Subcutaneous Tissue): No Fascia: No Tendon: No Tendon: No Tendon: No Muscle: No Muscle: No Muscle: No Joint: No Joint: No Joint: No Bone: No Bone: No Bone: No Medium (34-66%) Small (1-33%) None Epithelialization: Treatment Notes Wound #11 (Right, Proximal, Lateral Foot) 1. Cleanse With Wound Cleanser 2. Periwound Care TCA Cream 3. Primary Dressing Applied Collegen [employer] 4. Secondary Dressing ABD Pad Dry Gauze Roll Gauze Heel Cup 5. Secured With Tape Wound #12 (Right, Dorsal Foot) 1. Cleanse With Wound Cleanser 2. Periwound Care TCA Cream  3. Primary Dressing Applied [employer] 4. Secondary Dressing ABD Pad Dry Gauze Roll Gauze Heel Cup 5. Secured With Tape Wound #13 (Right, Lateral Ankle) 1. Cleanse With Wound Cleanser 2. Periwound Care TCA Cream 3. Primary Dressing Applied [employer] 4. Secondary Dressing ABD Pad Dry Gauze Roll Gauze Heel Cup 5. Secured With Tape Wound #2 (Right Toe Great) 1. Cleanse With Wound Cleanser 3. Primary Dressing Applied Other primary dressing (specifiy in notes) Notes paint with betadine Wound #4 (Right, Medial Toe Great) 1. Cleanse  With Wound Cleanser 3. Primary Dressing Applied Other primary dressing (specifiy in notes) Notes paint with betadine Wound #6 (Right, Distal, Lateral Foot) 1. Cleanse With Wound Cleanser 2. Periwound Care TCA Cream 3. Primary Dressing Applied [employer] 4. Secondary Dressing ABD Pad Dry Gauze Roll Gauze Heel Cup 5. Secured With Recruitment consultant) Signed: 07/29/2019 6:00:01 PM By: [name] MD Signed: 08/01/2019 5:42:58 PM By: [name] RN Entered By: [name] on 07/29/2019 17:28:20 -------------------------------------------------------------------------------- Multi-Disciplinary Care Plan Details Patient Name: Date of Service: DO RN, [name] 07/29/2019 2:00 PM Medical Record Number: [id number] Patient Account Number: [id number] Date of Birth/Sex: Treating RN: 12/18/1929 (86 y.o. [name]) [name] Primary Care Stepen Prins: Piedad Climes, CHA RLES Other Clinician: Referring Ryan Ogborn: Treating Kayra Crowell/Extender: Mare Ferrari, CHA RLES Weeks in Treatment: 14 Active Inactive Electronic Signature(s) Signed: 08/19/2019 5:05:45 PM By: [name] RN Signed: 09/30/2019 7:59:49 AM By: Levan Hurst RN, BSN Previous Signature: 08/01/2019 5:42:58 PM Version By: [name] RN Entered By: Levan Hurst on 08/15/2019 11:12:27 -------------------------------------------------------------------------------- Pain Assessment Details Patient Name: Date of Service: DO RN, [name] 07/29/2019 2:00 PM Medical Record Number: [id number] Patient Account Number: [id number] Date of Birth/Sex: Treating RN: 08-16-1929 (86 y.o. [name] Primary Care Ayse Mccartin: Piedad Climes, CHA RLES Other Clinician: Referring Ericia Moxley: Treating Revis Whalin/Extender: Mare Ferrari, CHA RLES Weeks in Treatment: 14 Active Problems Location of Pain Severity and Description of Pain Patient Has Paino Yes Site Locations Pain Location: Generalized Pain, Pain in Ulcers With  Dressing Change: Yes Duration of the Pain. Constant / Intermittento Intermittent Rate the pain. Current Pain Level: 2 Worst Pain Level: 7 Least Pain Level: 0 Character of Pain Describe the Pain: Aching, Burning, Tender, Throbbing Pain Management and Medication Current Pain Management: Medication: Yes Is the Current Pain Management Adequate: Adequate Rest: Yes How does your wound impact your activities of daily livingo Sleep: Yes Bathing: No Appetite: No Relationship With Others: No Bladder Continence: No Emotions: Yes Bowel Continence: No Hobbies: Yes Toileting: No Dressing: No Electronic Signature(s) Signed: 07/29/2019 6:10:08 PM By: Baruch Gouty RN, BSN Entered By: Baruch Gouty on 07/29/2019 14:30:16 -------------------------------------------------------------------------------- Patient/Caregiver Education Details Patient Name: Date of Service: DO RN, [name] ID [name] 7/20/2021andnbsp2:00 PM Medical Record Number: [id number] Patient Account Number: [id number] Date of Birth/Gender: Treating RN: 03-07-1929 (86 y.o. [name]) [name] Primary Care Physician: Piedad Climes, CHA RLES Other Clinician: Referring Physician: Treating Physician/Extender: Mare Ferrari, CHA RLES Weeks in Treatment: 14 Education Assessment Education Provided To: Patient Education Topics Provided Wound/Skin Impairment: Methods: Explain/Verbal Responses: State content correctly Electronic Signature(s) Signed: 08/01/2019 5:42:58 PM By: [name] RN Entered By: [name] on 07/29/2019 14:14:51 -------------------------------------------------------------------------------- Wound Assessment Details Patient Name: Date of Service: DO RN, [name] 07/29/2019 2:00 PM Medical Record Number: [id number] Patient Account Number: [id number] Date of Birth/Sex: Treating RN: [birth year] (86 y.o. [name] Primary Care Waynetta Metheny: Piedad Climes, CHA RLES Other Clinician: Referring Kynzlee Hucker:  Treating  Kamron Vanwyhe/Extender: Mare Ferrari, CHA RLES Weeks in Treatment: 14 Wound Status Wound Number: 11 Primary Arterial Insufficiency Ulcer Etiology: Wound Location: Right, Proximal, Lateral Foot Wound Open Wounding Event: Gradually Appeared Status: Date Acquired: 06/30/2019 Comorbid Cataracts, Asthma, Chronic Obstructive Pulmonary Disease Weeks Of Treatment: 0 History: (COPD), Congestive Heart Failure, Coronary Artery Disease, Clustered Wound: No Hypertension, Peripheral Arterial Disease, Osteoarthritis, Confinement Anxiety Photos Photo Uploaded By: Mikeal Hawthorne on 07/30/2019 14:06:33 Wound Measurements Length: (cm) 1.1 Width: (cm) 0.5 Depth: (cm) 0.1 Area: (cm) 0.432 Volume: (cm) 0.043 % Reduction in Area: % Reduction in Volume: Epithelialization: Small (1-33%) Tunneling: No Undermining: No Wound Description Classification: Full Thickness Without Exposed Support Structures Wound Margin: Flat and Intact Exudate Amount: Small Exudate Type: Serous Exudate Color: amber Foul Odor After Cleansing: No Slough/Fibrino No Wound Bed Granulation Amount: None Present (0%) Exposed Structure Necrotic Amount: Large (67-100%) Fascia Exposed: No Necrotic Quality: Adherent Slough Fat Layer (Subcutaneous Tissue) Exposed: Yes Tendon Exposed: No Muscle Exposed: No Joint Exposed: No Bone Exposed: No Electronic Signature(s) Signed: 07/29/2019 6:10:08 PM By: Baruch Gouty RN, BSN Entered By: Baruch Gouty on 07/29/2019 14:47:00 -------------------------------------------------------------------------------- Wound Assessment Details Patient Name: Date of Service: DO RN, [name] 07/29/2019 2:00 PM Medical Record Number: [id number] Patient Account Number: [id number] Date of Birth/Sex: Treating RN: May 18, 1929 (86 y.o. [name] Primary Care Mekaela Azizi: Piedad Climes, CHA RLES Other Clinician: Referring Babette Stum: Treating Isadore Palecek/Extender: Mare Ferrari, CHA  RLES Weeks in Treatment: 14 Wound Status Wound Number: 12 Primary Arterial Insufficiency Ulcer Etiology: Wound Location: Right, Dorsal Foot Secondary Lymphedema Wounding Event: Gradually Appeared Etiology: Date Acquired: 06/30/2019 Wound Open Weeks Of Treatment: 0 Status: Clustered Wound: No Comorbid Cataracts, Asthma, Chronic Obstructive Pulmonary Disease History: (COPD), Congestive Heart Failure, Coronary Artery Disease, Hypertension, Peripheral Arterial Disease, Osteoarthritis, Confinement Anxiety Photos Photo Uploaded By: Mikeal Hawthorne on 07/30/2019 14:03:52 Wound Measurements Length: (cm) 7.5 Width: (cm) 6.2 Depth: (cm) 0.1 Area: (cm) 36.521 Volume: (cm) 3.652 % Reduction in Area: % Reduction in Volume: Epithelialization: Medium (34-66%) Tunneling: No Undermining: No Wound Description Classification: Full Thickness Without Exposed Support Structures Wound Margin: Flat and Intact Exudate Amount: Medium Exudate Type: Serous Exudate Color: amber Foul Odor After Cleansing: No Slough/Fibrino Yes Wound Bed Granulation Amount: None Present (0%) Exposed Structure Necrotic Amount: Large (67-100%) Fascia Exposed: No Necrotic Quality: Adherent Slough Fat Layer (Subcutaneous Tissue) Exposed: Yes Tendon Exposed: No Muscle Exposed: No Joint Exposed: No Bone Exposed: No Electronic Signature(s) Signed: 07/29/2019 6:10:08 PM By: Baruch Gouty RN, BSN Entered By: Baruch Gouty on 07/29/2019 14:49:21 -------------------------------------------------------------------------------- Wound Assessment Details Patient Name: Date of Service: DO RN, [name] 07/29/2019 2:00 PM Medical Record Number: [id number] Patient Account Number: [id number] Date of Birth/Sex: Treating RN: 1929-12-02 (86 y.o. [name] Primary Care Kee Drudge: Piedad Climes, CHA RLES Other Clinician: Referring Roxsana Riding: Treating Braxston Quinter/Extender: Mare Ferrari, CHA RLES Weeks in Treatment:  14 Wound Status Wound Number: 13 Primary Arterial Insufficiency Ulcer Etiology: Wound Location: Right, Lateral Ankle Wound Open Wounding Event: Gradually Appeared Status: Date Acquired: 06/30/2019 Comorbid Cataracts, Asthma, Chronic Obstructive Pulmonary Disease Weeks Of Treatment: 0 History: (COPD), Congestive Heart Failure, Coronary Artery Disease, Clustered Wound: No Hypertension, Peripheral Arterial Disease, Osteoarthritis, Confinement Anxiety Photos Photo Uploaded By: Mikeal Hawthorne on 07/30/2019 14:02:57 Wound Measurements Length: (cm) 1.5 Width: (cm) 1.4 Depth: (cm) 0.1 Area: (cm) 1.649 Volume: (cm) 0.165 % Reduction in Area: % Reduction in Volume: Epithelialization: Small (1-33%) Tunneling: No Undermining: No Wound Description Classification: Full  Thickness Without Exposed Support Structures Wound Margin: Flat and Intact Exudate Amount: Small Exudate Type: Serous Exudate Color: amber Foul Odor After Cleansing: No Slough/Fibrino Yes Wound Bed Granulation Amount: None Present (0%) Exposed Structure Necrotic Amount: Large (67-100%) Fascia Exposed: No Necrotic Quality: Adherent Slough Fat Layer (Subcutaneous Tissue) Exposed: Yes Tendon Exposed: No Muscle Exposed: No Joint Exposed: No Bone Exposed: No Electronic Signature(s) Signed: 07/29/2019 6:10:08 PM By: Baruch Gouty RN, BSN Entered By: Baruch Gouty on 07/29/2019 14:50:38 -------------------------------------------------------------------------------- Wound Assessment Details Patient Name: Date of Service: DO RN, [name] 07/29/2019 2:00 PM Medical Record Number: [id number] Patient Account Number: [id number] Date of Birth/Sex: Treating RN: 1929/05/18 (86 y.o. [name] Primary Care Benino Korinek: Piedad Climes, CHA RLES Other Clinician: Referring Desiderio Dolata: Treating Filippa Yarbough/Extender: Mare Ferrari, CHA RLES Weeks in Treatment: 14 Wound Status Wound Number: 2 Primary Arterial  Insufficiency Ulcer Etiology: Wound Location: Right T Great oe Wound Open Wounding Event: Gradually Appeared Status: Date Acquired: 03/31/2019 Comorbid Cataracts, Asthma, Chronic Obstructive Pulmonary Disease Weeks Of Treatment: 14 History: (COPD), Congestive Heart Failure, Coronary Artery Disease, Clustered Wound: No Hypertension, Peripheral Arterial Disease, Osteoarthritis, Confinement Anxiety Photos Photo Uploaded By: Mikeal Hawthorne on 07/30/2019 14:03:52 Wound Measurements Length: (cm) 0.3 Width: (cm) 0.4 Depth: (cm) 0.1 Area: (cm) 0.094 Volume: (cm) 0.009 % Reduction in Area: 92.7% % Reduction in Volume: 93.1% Epithelialization: Medium (34-66%) Tunneling: No Undermining: No Wound Description Classification: Unclassifiable Wound Margin: Flat and Intact Exudate Amount: None Present Foul Odor After Cleansing: No Slough/Fibrino No Wound Bed Granulation Amount: None Present (0%) Exposed Structure Necrotic Amount: Large (67-100%) Fascia Exposed: No Necrotic Quality: Eschar Fat Layer (Subcutaneous Tissue) Exposed: No Tendon Exposed: No Muscle Exposed: No Joint Exposed: No Bone Exposed: No Electronic Signature(s) Signed: 07/29/2019 6:10:08 PM By: Baruch Gouty RN, BSN Entered By: Baruch Gouty on 07/29/2019 14:51:36 -------------------------------------------------------------------------------- Wound Assessment Details Patient Name: Date of Service: DO RN, [name] 07/29/2019 2:00 PM Medical Record Number: [id number] Patient Account Number: [id number] Date of Birth/Sex: Treating RN: Jan 14, 1929 (86 y.o. [name] Primary Care Tayler Lassen: Piedad Climes, CHA RLES Other Clinician: Referring Annye Forrey: Treating Jacole Capley/Extender: Mare Ferrari, CHA RLES Weeks in Treatment: 14 Wound Status Wound Number: 4 Primary Arterial Insufficiency Ulcer Etiology: Wound Location: Right, Medial T Great oe Wound Open Wounding Event: Gradually  Appeared Status: Date Acquired: 05/13/2019 Comorbid Cataracts, Asthma, Chronic Obstructive Pulmonary Disease Weeks Of Treatment: 11 History: (COPD), Congestive Heart Failure, Coronary Artery Disease, Clustered Wound: No Hypertension, Peripheral Arterial Disease, Osteoarthritis, Confinement Anxiety Photos Photo Uploaded By: Mikeal Hawthorne on 07/30/2019 14:03:20 Wound Measurements Length: (cm) 1.4 Width: (cm) 1.3 Depth: (cm) 0.1 Area: (cm) 1.429 Volume: (cm) 0.143 % Reduction in Area: 7.1% % Reduction in Volume: 7.1% Epithelialization: Small (1-33%) Tunneling: No Undermining: No Wound Description Classification: Full Thickness Without Exposed Support Structures Wound Margin: Flat and Intact Exudate Amount: Small Exudate Type: Serous Exudate Color: amber Foul Odor After Cleansing: No Slough/Fibrino Yes Wound Bed Granulation Amount: None Present (0%) Exposed Structure Necrotic Amount: Large (67-100%) Fascia Exposed: No Necrotic Quality: Eschar Fat Layer (Subcutaneous Tissue) Exposed: No Tendon Exposed: No Muscle Exposed: No Joint Exposed: No Bone Exposed: No Electronic Signature(s) Signed: 07/29/2019 6:10:08 PM By: Baruch Gouty RN, BSN Entered By: Baruch Gouty on 07/29/2019 14:51:50 -------------------------------------------------------------------------------- Wound Assessment Details Patient Name: Date of Service: DO RN, [name] 07/29/2019 2:00 PM Medical Record Number: [id number] Patient Account Number: [id number] Date of Birth/Sex: Treating RN: 1929/08/23 (86 y.o. [name] Primary Care  Leyda Vanderwerf: Piedad Climes, CHA RLES Other Clinician: Referring Arnold Kester: Treating Varnell Donate/Extender: Mare Ferrari, CHA RLES Weeks in Treatment: 14 Wound Status Wound Number: 6 Primary Arterial Insufficiency Ulcer Etiology: Wound Location: Right, Distal, Lateral Foot Wound Open Wounding Event: Gradually Appeared Status: Date Acquired: 05/23/2019 Comorbid  Cataracts, Asthma, Chronic Obstructive Pulmonary Disease Weeks Of Treatment: 9 History: (COPD), Congestive Heart Failure, Coronary Artery Disease, Clustered Wound: No Hypertension, Peripheral Arterial Disease, Osteoarthritis, Confinement Anxiety Photos Photo Uploaded By: Mikeal Hawthorne on 07/30/2019 14:03:21 Wound Measurements Length: (cm) 1.6 Width: (cm) 0.9 Depth: (cm) 0.1 Area: (cm) 1.131 Volume: (cm) 0.113 % Reduction in Area: -260.2% % Reduction in Volume: -264.5% Epithelialization: None Tunneling: No Undermining: No Wound Description Classification: Unclassifiable Wound Margin: Flat and Intact Exudate Amount: Medium Exudate Type: Serous Exudate Color: amber Foul Odor After Cleansing: No Slough/Fibrino Yes Wound Bed Granulation Amount: None Present (0%) Exposed Structure Necrotic Amount: Large (67-100%) Fascia Exposed: No Necrotic Quality: Adherent Slough Fat Layer (Subcutaneous Tissue) Exposed: Yes Tendon Exposed: No Muscle Exposed: No Joint Exposed: No Bone Exposed: No Electronic Signature(s) Signed: 07/29/2019 6:10:08 PM By: Baruch Gouty RN, BSN Signed: 07/29/2019 6:10:08 PM By: Baruch Gouty RN, BSN Entered By: Baruch Gouty on 07/29/2019 14:52:10 -------------------------------------------------------------------------------- Vitals Details Patient Name: Date of Service: DO RN, [name] 07/29/2019 2:00 PM Medical Record Number: [id number] Patient Account Number: [id number] Date of Birth/Sex: Treating RN: 10-13-1929 (86 y.o. [name] Primary Care Earlyn Sylvan: Piedad Climes, CHA RLES Other Clinician: Referring Iqra Rotundo: Treating Zionna Homewood/Extender: Mare Ferrari, CHA RLES Weeks in Treatment: 14 Vital Signs Time Taken: 14:38 Temperature (F): 98.1 Height (in): 69 Pulse (bpm): 109 Source: Stated Respiratory Rate (breaths/min): 18 Weight (lbs): 132 Blood Pressure (mmHg): 151/76 Source: Stated Reference Range: 80 - 120 mg / dl Body  Mass Index (BMI): 19.5 Electronic Signature(s) Signed: 07/29/2019 6:10:08 PM By: Baruch Gouty RN, BSN Entered By: Baruch Gouty on 07/29/2019 14:28:53

## 2019-08-03 DIAGNOSIS — I25709 Atherosclerosis of coronary artery bypass graft(s), unspecified, with unspecified angina pectoris: Secondary | ICD-10-CM | POA: Diagnosis not present

## 2019-08-03 DIAGNOSIS — I110 Hypertensive heart disease with heart failure: Secondary | ICD-10-CM | POA: Diagnosis not present

## 2019-08-03 DIAGNOSIS — I7092 Chronic total occlusion of artery of the extremities: Secondary | ICD-10-CM | POA: Diagnosis not present

## 2019-08-03 DIAGNOSIS — J9612 Chronic respiratory failure with hypercapnia: Secondary | ICD-10-CM | POA: Diagnosis not present

## 2019-08-03 DIAGNOSIS — F419 Anxiety disorder, unspecified: Secondary | ICD-10-CM | POA: Diagnosis not present

## 2019-08-03 DIAGNOSIS — J310 Chronic rhinitis: Secondary | ICD-10-CM | POA: Diagnosis not present

## 2019-08-03 DIAGNOSIS — N400 Enlarged prostate without lower urinary tract symptoms: Secondary | ICD-10-CM | POA: Diagnosis not present

## 2019-08-03 DIAGNOSIS — I70235 Atherosclerosis of native arteries of right leg with ulceration of other part of foot: Secondary | ICD-10-CM | POA: Diagnosis not present

## 2019-08-03 DIAGNOSIS — J449 Chronic obstructive pulmonary disease, unspecified: Secondary | ICD-10-CM | POA: Diagnosis not present

## 2019-08-03 DIAGNOSIS — K219 Gastro-esophageal reflux disease without esophagitis: Secondary | ICD-10-CM | POA: Diagnosis not present

## 2019-08-03 DIAGNOSIS — M1990 Unspecified osteoarthritis, unspecified site: Secondary | ICD-10-CM | POA: Diagnosis not present

## 2019-08-03 DIAGNOSIS — Z951 Presence of aortocoronary bypass graft: Secondary | ICD-10-CM | POA: Diagnosis not present

## 2019-08-03 DIAGNOSIS — E785 Hyperlipidemia, unspecified: Secondary | ICD-10-CM | POA: Diagnosis not present

## 2019-08-03 DIAGNOSIS — L97519 Non-pressure chronic ulcer of other part of right foot with unspecified severity: Secondary | ICD-10-CM | POA: Diagnosis not present

## 2019-08-03 DIAGNOSIS — I480 Paroxysmal atrial fibrillation: Secondary | ICD-10-CM | POA: Diagnosis not present

## 2019-08-03 DIAGNOSIS — F329 Major depressive disorder, single episode, unspecified: Secondary | ICD-10-CM | POA: Diagnosis not present

## 2019-08-03 DIAGNOSIS — N200 Calculus of kidney: Secondary | ICD-10-CM | POA: Diagnosis not present

## 2019-08-03 DIAGNOSIS — I255 Ischemic cardiomyopathy: Secondary | ICD-10-CM | POA: Diagnosis not present

## 2019-08-03 DIAGNOSIS — H9190 Unspecified hearing loss, unspecified ear: Secondary | ICD-10-CM | POA: Diagnosis not present

## 2019-08-03 DIAGNOSIS — L03115 Cellulitis of right lower limb: Secondary | ICD-10-CM | POA: Diagnosis not present

## 2019-08-03 DIAGNOSIS — Z4800 Encounter for change or removal of nonsurgical wound dressing: Secondary | ICD-10-CM | POA: Diagnosis not present

## 2019-08-03 DIAGNOSIS — J9611 Chronic respiratory failure with hypoxia: Secondary | ICD-10-CM | POA: Diagnosis not present

## 2019-08-03 DIAGNOSIS — I5032 Chronic diastolic (congestive) heart failure: Secondary | ICD-10-CM | POA: Diagnosis not present

## 2019-08-03 DIAGNOSIS — L03116 Cellulitis of left lower limb: Secondary | ICD-10-CM | POA: Diagnosis not present

## 2019-08-03 DIAGNOSIS — Z87442 Personal history of urinary calculi: Secondary | ICD-10-CM | POA: Diagnosis not present

## 2019-08-04 ENCOUNTER — Other Ambulatory Visit: Payer: Self-pay | Admitting: *Deleted

## 2019-08-04 DIAGNOSIS — Z4800 Encounter for change or removal of nonsurgical wound dressing: Secondary | ICD-10-CM | POA: Diagnosis not present

## 2019-08-04 DIAGNOSIS — L03115 Cellulitis of right lower limb: Secondary | ICD-10-CM | POA: Diagnosis not present

## 2019-08-04 DIAGNOSIS — L03116 Cellulitis of left lower limb: Secondary | ICD-10-CM | POA: Diagnosis not present

## 2019-08-04 DIAGNOSIS — L97519 Non-pressure chronic ulcer of other part of right foot with unspecified severity: Secondary | ICD-10-CM | POA: Diagnosis not present

## 2019-08-04 DIAGNOSIS — I7092 Chronic total occlusion of artery of the extremities: Secondary | ICD-10-CM | POA: Diagnosis not present

## 2019-08-04 DIAGNOSIS — I70235 Atherosclerosis of native arteries of right leg with ulceration of other part of foot: Secondary | ICD-10-CM | POA: Diagnosis not present

## 2019-08-04 NOTE — Patient Outreach (Signed)
[facility]) Care Management  08/04/2019  [name] 1929-04-10 [id number]   Outreach attempt #3, unsuccessful.    Call placed to member's wife to follow up on decision for member's plan of care. No answer, phone rang busy.  Will follow up within the next 3 weeks per workflow, if remain unsuccessful, will close case at that time.  Valente Kache, [place], MSN [place] [phone]

## 2019-08-05 NOTE — Progress Notes (Signed)
COMMUNITY PALLIATIVE CARE SW NOTE  PATIENT NAME: [name] DOB: 1929/10/10 MRN: [id number]  PRIMARY CARE PROVIDER: [name], MD  RESPONSIBLE PARTY:  Acct ID - Guarantor Home Phone Work Phone Relationship Acct Type  [id number] - Byron,Berlin L [phone]  Self P/F     [address], [name], [place] [address]     PLAN OF CARE and INTERVENTIONS:             1. GOALS OF CARE/ ADVANCE CARE PLANNING:  Patient desires to remain in his home and receive care there.  2. SOCIAL/EMOTIONAL/SPIRITUAL ASSESSMENT/ INTERVENTIONS:  SW and RN-Monishia Nadara Mustard completed a face-to-face visit with patient to assess needs, comfort and provide support.. Patient was present with his wife-[name] and son-Eric. Patient current status is as follows: increased pain, wound care is ongoing, unsteady gait, increased weakness, increased fatigue, frail, difficulty making decisions, depressed, anxious and feels he is not having the best quality of life. The family will be talking with a navigator to sort out patient care needs. The family have hired Production manager for personal care assistance, Designer, jewellery will continue with wound care and the family is considering long-term care facility placement. Education provided education to patient and family on the benefits of hospice services, detailing the services provided under this service. Patient was adamant that he wanted to stay in his home, see his same physicians and get the same medications he is currently on and did not want hospice services despite the benefits. SW provided a hospice patient notebook with the family to review as a resource in their decision making. SW also provided a resource for a stair lift that maybe useful for patient if he remains home. SW provided provided supportive and reflective listening and counseling, education, resources to improve quality of life and independence.  3. PATIENT/CAREGIVER EDUCATION/ COPING: Patient is alert and oriented x3  and still able to participate in decision-making for himself. Family is at a crossroads as they, along with patient try to navigate his goals of care with increased care needs.  4. PERSONAL EMERGENCY PLAN:  911 can be activated for emergencies.  5. COMMUNITY RESOURCES COORDINATION/ HEALTH CARE NAVIGATION:  Patient has personal care service agency, [facility] for wound care. 6. FINANCIAL/LEGAL CONCERNS/INTERVENTIONS:  No financial or legal issues presented.      SOCIAL HX:  Social History   Tobacco Use  . Smoking status: Former Smoker    Packs/day: 2.00    Years: 33.00    Pack years: 66.00    Types: Cigarettes    Quit date: 03/25/1982    Years since quitting: 37.3  . Smokeless tobacco: Never Used  Substance Use Topics  . Alcohol use: Yes    Alcohol/week: 0.0 standard drinks    Comment: 1.5-3 oz daily in the past, minimal use in the last few months    CODE STATUS: None ADVANCED DIRECTIVES: No MOST FORM COMPLETE:  No HOSPICE EDUCATION PROVIDED: Yes  PPS: Patient is having increased pain, wound care is ongoing, unsteady gait, increased weakness, increased fatigue, frail, difficulty making decisions, depressed and anxious. He is alert and oriented x3.    Duration of visit and documentation: 60 minutes   Katheren Puller, LCSW

## 2019-08-07 DIAGNOSIS — I70235 Atherosclerosis of native arteries of right leg with ulceration of other part of foot: Secondary | ICD-10-CM | POA: Diagnosis not present

## 2019-08-07 DIAGNOSIS — I7092 Chronic total occlusion of artery of the extremities: Secondary | ICD-10-CM | POA: Diagnosis not present

## 2019-08-07 DIAGNOSIS — L97519 Non-pressure chronic ulcer of other part of right foot with unspecified severity: Secondary | ICD-10-CM | POA: Diagnosis not present

## 2019-08-07 DIAGNOSIS — L03116 Cellulitis of left lower limb: Secondary | ICD-10-CM | POA: Diagnosis not present

## 2019-08-07 DIAGNOSIS — L03115 Cellulitis of right lower limb: Secondary | ICD-10-CM | POA: Diagnosis not present

## 2019-08-07 DIAGNOSIS — Z4800 Encounter for change or removal of nonsurgical wound dressing: Secondary | ICD-10-CM | POA: Diagnosis not present

## 2019-08-08 ENCOUNTER — Other Ambulatory Visit (HOSPITAL_COMMUNITY)
Admission: RE | Admit: 2019-08-08 | Discharge: 2019-08-08 | Disposition: A | Discharge status: Home / Self Care | Payer: Medicare Other | Source: Ambulatory Visit | Attending: Vascular Surgery | Admitting: Vascular Surgery

## 2019-08-08 ENCOUNTER — Encounter (HOSPITAL_COMMUNITY): Payer: Self-pay | Admitting: Vascular Surgery

## 2019-08-08 ENCOUNTER — Other Ambulatory Visit: Payer: Self-pay

## 2019-08-08 DIAGNOSIS — Z01812 Encounter for preprocedural laboratory examination: Secondary | ICD-10-CM | POA: Insufficient documentation

## 2019-08-08 DIAGNOSIS — I70235 Atherosclerosis of native arteries of right leg with ulceration of other part of foot: Secondary | ICD-10-CM | POA: Diagnosis not present

## 2019-08-08 DIAGNOSIS — I7092 Chronic total occlusion of artery of the extremities: Secondary | ICD-10-CM | POA: Diagnosis not present

## 2019-08-08 DIAGNOSIS — L03115 Cellulitis of right lower limb: Secondary | ICD-10-CM | POA: Diagnosis not present

## 2019-08-08 DIAGNOSIS — L03116 Cellulitis of left lower limb: Secondary | ICD-10-CM | POA: Diagnosis not present

## 2019-08-08 DIAGNOSIS — Z20822 Contact with and (suspected) exposure to covid-19: Secondary | ICD-10-CM | POA: Insufficient documentation

## 2019-08-08 DIAGNOSIS — L97519 Non-pressure chronic ulcer of other part of right foot with unspecified severity: Secondary | ICD-10-CM | POA: Diagnosis not present

## 2019-08-08 DIAGNOSIS — Z4800 Encounter for change or removal of nonsurgical wound dressing: Secondary | ICD-10-CM | POA: Diagnosis not present

## 2019-08-08 LAB — SARS CORONAVIRUS 2 (TAT 6-24 HRS): SARS Coronavirus 2: NEGATIVE

## 2019-08-08 NOTE — Progress Notes (Signed)
Spoke with pt's wife and pt for pre-op call. Pt has extensive cardiac history and respiratory history. Pt uses a Trilogy ventilator at night. Pt stopped his 81 mg Aspirin 4 days ago because no one had instructed him about whether he needed to stop it or not. He said he thought since he was having surgery he needed to stop it. I instructed him to start it back because Dr. Scot Dock has his patients continue their Aspirin and even take it on the the day of surgery. He states he will start it back in the AM. Pt and wife state that pt is extremely anxious about this surgery. Pt is also very hard of hearing. [name] needs to be in pre-op with him to help keep him calm and help him with any questions staff may have.   Covid test done today, Mrs. Wegener states pt has been in quarantine since the test was done and will stay in quarantine until he comes to the hospital.

## 2019-08-10 NOTE — Anesthesia Preprocedure Evaluation (Addendum)
Anesthesia Evaluation  Patient identified by MRN, date of birth, ID band Patient awake    Reviewed: Allergy & Precautions, NPO status , Patient's Chart, lab work & pertinent test results  Airway Mallampati: II  TM Distance: >3 FB Neck ROM: Full    Dental  (+) Dental Advisory Given   Pulmonary asthma , sleep apnea , COPD,  COPD inhaler and oxygen dependent, former smoker,    breath sounds clear to auscultation       Cardiovascular hypertension, Pt. on medications + CAD, + Past MI, + CABG, + Peripheral Vascular Disease, +CHF and + DOE  + dysrhythmias  Rhythm:Regular Rate:Normal  Normal EF. pulm HTN. No significant valvular stenosis or regurgitation. ?Bicuspid AV on 2020 outside echo   Neuro/Psych negative neurological ROS     GI/Hepatic Neg liver ROS, GERD  ,  Endo/Other  negative endocrine ROS  Renal/GU Renal disease     Musculoskeletal  (+) Arthritis ,   Abdominal   Peds  Hematology negative hematology ROS (+)   Anesthesia Other Findings   Reproductive/Obstetrics                            Lab Results  Component Value Date   WBC 7.4 07/02/2019   HGB 10.4 (L) 07/02/2019   HCT 31.7 (L) 07/02/2019   MCV 98.8 07/02/2019   PLT 179 07/02/2019   Lab Results  Component Value Date   CREATININE 0.93 07/02/2019   BUN 15 07/02/2019   NA 136 07/02/2019   K 4.0 07/02/2019   CL 99 07/02/2019   CO2 28 07/02/2019   Lab Results  Component Value Date   INR 1.0 06/27/2019   INR 1.06 04/04/2017   INR 1.16 03/09/2017    Anesthesia Physical Anesthesia Plan  ASA: IV  Anesthesia Plan: Spinal   Post-op Pain Management:    Induction:   PONV Risk Score and Plan: 1 and Propofol infusion, Ondansetron, Dexamethasone and Treatment may vary due to age or medical condition  Airway Management Planned: Natural Airway and Simple Face Mask  Additional Equipment:   Intra-op Plan:   Post-operative  Plan:   Informed Consent: I have reviewed the patients History and Physical, chart, labs and discussed the procedure including the risks, benefits and alternatives for the proposed anesthesia with the patient or authorized representative who has indicated his/her understanding and acceptance.     Dental advisory given  Plan Discussed with: CRNA  Anesthesia Plan Comments:        Anesthesia Quick Evaluation

## 2019-08-11 ENCOUNTER — Encounter (HOSPITAL_COMMUNITY)
Admission: RE | Disposition: A | Discharge status: Rehabilitation Facility | Payer: Self-pay | Source: Home / Self Care | Attending: Family Medicine

## 2019-08-11 ENCOUNTER — Telehealth: Payer: Self-pay

## 2019-08-11 ENCOUNTER — Inpatient Hospital Stay (HOSPITAL_COMMUNITY)
Admission: RE | Admit: 2019-08-11 | Discharge: 2019-08-14 | DRG: 240 | Disposition: A | Discharge status: Rehabilitation Facility | Payer: Medicare Other | Attending: Family Medicine | Admitting: Family Medicine

## 2019-08-11 ENCOUNTER — Inpatient Hospital Stay (HOSPITAL_COMMUNITY): Payer: Medicare Other | Admitting: Anesthesiology

## 2019-08-11 ENCOUNTER — Encounter (HOSPITAL_COMMUNITY): Payer: Self-pay | Admitting: Vascular Surgery

## 2019-08-11 ENCOUNTER — Other Ambulatory Visit: Payer: Self-pay

## 2019-08-11 DIAGNOSIS — Z951 Presence of aortocoronary bypass graft: Secondary | ICD-10-CM | POA: Diagnosis not present

## 2019-08-11 DIAGNOSIS — Z83438 Family history of other disorder of lipoprotein metabolism and other lipidemia: Secondary | ICD-10-CM

## 2019-08-11 DIAGNOSIS — F419 Anxiety disorder, unspecified: Secondary | ICD-10-CM | POA: Diagnosis present

## 2019-08-11 DIAGNOSIS — I70238 Atherosclerosis of native arteries of right leg with ulceration of other part of lower right leg: Secondary | ICD-10-CM | POA: Diagnosis not present

## 2019-08-11 DIAGNOSIS — Z7951 Long term (current) use of inhaled steroids: Secondary | ICD-10-CM

## 2019-08-11 DIAGNOSIS — D72829 Elevated white blood cell count, unspecified: Secondary | ICD-10-CM | POA: Diagnosis not present

## 2019-08-11 DIAGNOSIS — R8290 Unspecified abnormal findings in urine: Secondary | ICD-10-CM | POA: Diagnosis present

## 2019-08-11 DIAGNOSIS — L97119 Non-pressure chronic ulcer of right thigh with unspecified severity: Secondary | ICD-10-CM | POA: Diagnosis not present

## 2019-08-11 DIAGNOSIS — F329 Major depressive disorder, single episode, unspecified: Secondary | ICD-10-CM | POA: Diagnosis present

## 2019-08-11 DIAGNOSIS — I5032 Chronic diastolic (congestive) heart failure: Secondary | ICD-10-CM | POA: Diagnosis present

## 2019-08-11 DIAGNOSIS — E785 Hyperlipidemia, unspecified: Secondary | ICD-10-CM | POA: Diagnosis present

## 2019-08-11 DIAGNOSIS — J449 Chronic obstructive pulmonary disease, unspecified: Secondary | ICD-10-CM | POA: Diagnosis present

## 2019-08-11 DIAGNOSIS — R000 Tachycardia, unspecified: Secondary | ICD-10-CM | POA: Diagnosis not present

## 2019-08-11 DIAGNOSIS — E8809 Other disorders of plasma-protein metabolism, not elsewhere classified: Secondary | ICD-10-CM | POA: Diagnosis not present

## 2019-08-11 DIAGNOSIS — J9612 Chronic respiratory failure with hypercapnia: Secondary | ICD-10-CM

## 2019-08-11 DIAGNOSIS — Z9842 Cataract extraction status, left eye: Secondary | ICD-10-CM

## 2019-08-11 DIAGNOSIS — I6529 Occlusion and stenosis of unspecified carotid artery: Secondary | ICD-10-CM | POA: Diagnosis present

## 2019-08-11 DIAGNOSIS — R338 Other retention of urine: Secondary | ICD-10-CM | POA: Diagnosis present

## 2019-08-11 DIAGNOSIS — G8918 Other acute postprocedural pain: Secondary | ICD-10-CM | POA: Diagnosis not present

## 2019-08-11 DIAGNOSIS — M25551 Pain in right hip: Secondary | ICD-10-CM | POA: Diagnosis not present

## 2019-08-11 DIAGNOSIS — L97519 Non-pressure chronic ulcer of other part of right foot with unspecified severity: Secondary | ICD-10-CM | POA: Diagnosis not present

## 2019-08-11 DIAGNOSIS — Z91048 Other nonmedicinal substance allergy status: Secondary | ICD-10-CM

## 2019-08-11 DIAGNOSIS — I2510 Atherosclerotic heart disease of native coronary artery without angina pectoris: Secondary | ICD-10-CM | POA: Diagnosis present

## 2019-08-11 DIAGNOSIS — I70231 Atherosclerosis of native arteries of right leg with ulceration of thigh: Secondary | ICD-10-CM | POA: Diagnosis not present

## 2019-08-11 DIAGNOSIS — R42 Dizziness and giddiness: Secondary | ICD-10-CM | POA: Diagnosis not present

## 2019-08-11 DIAGNOSIS — I739 Peripheral vascular disease, unspecified: Secondary | ICD-10-CM | POA: Diagnosis not present

## 2019-08-11 DIAGNOSIS — R079 Chest pain, unspecified: Secondary | ICD-10-CM | POA: Diagnosis not present

## 2019-08-11 DIAGNOSIS — Z881 Allergy status to other antibiotic agents status: Secondary | ICD-10-CM

## 2019-08-11 DIAGNOSIS — Z66 Do not resuscitate: Secondary | ICD-10-CM | POA: Diagnosis present

## 2019-08-11 DIAGNOSIS — R7401 Elevation of levels of liver transaminase levels: Secondary | ICD-10-CM | POA: Diagnosis not present

## 2019-08-11 DIAGNOSIS — E039 Hypothyroidism, unspecified: Secondary | ICD-10-CM | POA: Diagnosis not present

## 2019-08-11 DIAGNOSIS — I110 Hypertensive heart disease with heart failure: Secondary | ICD-10-CM | POA: Diagnosis not present

## 2019-08-11 DIAGNOSIS — Z823 Family history of stroke: Secondary | ICD-10-CM

## 2019-08-11 DIAGNOSIS — I25709 Atherosclerosis of coronary artery bypass graft(s), unspecified, with unspecified angina pectoris: Secondary | ICD-10-CM | POA: Diagnosis present

## 2019-08-11 DIAGNOSIS — K219 Gastro-esophageal reflux disease without esophagitis: Secondary | ICD-10-CM | POA: Diagnosis present

## 2019-08-11 DIAGNOSIS — Z9981 Dependence on supplemental oxygen: Secondary | ICD-10-CM

## 2019-08-11 DIAGNOSIS — I480 Paroxysmal atrial fibrillation: Secondary | ICD-10-CM | POA: Diagnosis present

## 2019-08-11 DIAGNOSIS — I130 Hypertensive heart and chronic kidney disease with heart failure and stage 1 through stage 4 chronic kidney disease, or unspecified chronic kidney disease: Secondary | ICD-10-CM | POA: Diagnosis present

## 2019-08-11 DIAGNOSIS — Z20822 Contact with and (suspected) exposure to covid-19: Secondary | ICD-10-CM | POA: Diagnosis present

## 2019-08-11 DIAGNOSIS — I70221 Atherosclerosis of native arteries of extremities with rest pain, right leg: Secondary | ICD-10-CM | POA: Diagnosis not present

## 2019-08-11 DIAGNOSIS — N400 Enlarged prostate without lower urinary tract symptoms: Secondary | ICD-10-CM | POA: Diagnosis present

## 2019-08-11 DIAGNOSIS — Z87891 Personal history of nicotine dependence: Secondary | ICD-10-CM | POA: Diagnosis not present

## 2019-08-11 DIAGNOSIS — I998 Other disorder of circulatory system: Secondary | ICD-10-CM

## 2019-08-11 DIAGNOSIS — J9611 Chronic respiratory failure with hypoxia: Secondary | ICD-10-CM | POA: Diagnosis not present

## 2019-08-11 DIAGNOSIS — H9190 Unspecified hearing loss, unspecified ear: Secondary | ICD-10-CM | POA: Diagnosis present

## 2019-08-11 DIAGNOSIS — Z85828 Personal history of other malignant neoplasm of skin: Secondary | ICD-10-CM

## 2019-08-11 DIAGNOSIS — E46 Unspecified protein-calorie malnutrition: Secondary | ICD-10-CM | POA: Diagnosis present

## 2019-08-11 DIAGNOSIS — E222 Syndrome of inappropriate secretion of antidiuretic hormone: Secondary | ICD-10-CM | POA: Diagnosis not present

## 2019-08-11 DIAGNOSIS — Z961 Presence of intraocular lens: Secondary | ICD-10-CM | POA: Diagnosis present

## 2019-08-11 DIAGNOSIS — N183 Chronic kidney disease, stage 3 unspecified: Secondary | ICD-10-CM | POA: Diagnosis present

## 2019-08-11 DIAGNOSIS — Z9841 Cataract extraction status, right eye: Secondary | ICD-10-CM

## 2019-08-11 DIAGNOSIS — E871 Hypo-osmolality and hyponatremia: Secondary | ICD-10-CM | POA: Diagnosis not present

## 2019-08-11 DIAGNOSIS — Z7982 Long term (current) use of aspirin: Secondary | ICD-10-CM | POA: Diagnosis not present

## 2019-08-11 DIAGNOSIS — N401 Enlarged prostate with lower urinary tract symptoms: Secondary | ICD-10-CM | POA: Diagnosis not present

## 2019-08-11 DIAGNOSIS — Z79899 Other long term (current) drug therapy: Secondary | ICD-10-CM

## 2019-08-11 DIAGNOSIS — M1990 Unspecified osteoarthritis, unspecified site: Secondary | ICD-10-CM | POA: Diagnosis present

## 2019-08-11 DIAGNOSIS — Z89611 Acquired absence of right leg above knee: Secondary | ICD-10-CM | POA: Diagnosis not present

## 2019-08-11 DIAGNOSIS — L89622 Pressure ulcer of left heel, stage 2: Secondary | ICD-10-CM | POA: Diagnosis not present

## 2019-08-11 DIAGNOSIS — Z87442 Personal history of urinary calculi: Secondary | ICD-10-CM | POA: Diagnosis not present

## 2019-08-11 DIAGNOSIS — L89152 Pressure ulcer of sacral region, stage 2: Secondary | ICD-10-CM | POA: Diagnosis not present

## 2019-08-11 DIAGNOSIS — F411 Generalized anxiety disorder: Secondary | ICD-10-CM | POA: Diagnosis not present

## 2019-08-11 DIAGNOSIS — J432 Centrilobular emphysema: Secondary | ICD-10-CM | POA: Diagnosis not present

## 2019-08-11 DIAGNOSIS — Z8249 Family history of ischemic heart disease and other diseases of the circulatory system: Secondary | ICD-10-CM

## 2019-08-11 DIAGNOSIS — N1831 Chronic kidney disease, stage 3a: Secondary | ICD-10-CM | POA: Diagnosis present

## 2019-08-11 DIAGNOSIS — I70261 Atherosclerosis of native arteries of extremities with gangrene, right leg: Secondary | ICD-10-CM | POA: Diagnosis not present

## 2019-08-11 DIAGNOSIS — D62 Acute posthemorrhagic anemia: Secondary | ICD-10-CM | POA: Diagnosis not present

## 2019-08-11 DIAGNOSIS — I10 Essential (primary) hypertension: Secondary | ICD-10-CM | POA: Diagnosis not present

## 2019-08-11 DIAGNOSIS — I5042 Chronic combined systolic (congestive) and diastolic (congestive) heart failure: Secondary | ICD-10-CM | POA: Diagnosis not present

## 2019-08-11 DIAGNOSIS — Z4781 Encounter for orthopedic aftercare following surgical amputation: Secondary | ICD-10-CM | POA: Diagnosis not present

## 2019-08-11 DIAGNOSIS — R0602 Shortness of breath: Secondary | ICD-10-CM | POA: Diagnosis not present

## 2019-08-11 DIAGNOSIS — J441 Chronic obstructive pulmonary disease with (acute) exacerbation: Secondary | ICD-10-CM | POA: Diagnosis present

## 2019-08-11 DIAGNOSIS — Z882 Allergy status to sulfonamides status: Secondary | ICD-10-CM

## 2019-08-11 DIAGNOSIS — L89159 Pressure ulcer of sacral region, unspecified stage: Secondary | ICD-10-CM | POA: Diagnosis not present

## 2019-08-11 DIAGNOSIS — I70229 Atherosclerosis of native arteries of extremities with rest pain, unspecified extremity: Secondary | ICD-10-CM | POA: Diagnosis present

## 2019-08-11 DIAGNOSIS — Z888 Allergy status to other drugs, medicaments and biological substances status: Secondary | ICD-10-CM

## 2019-08-11 DIAGNOSIS — R0989 Other specified symptoms and signs involving the circulatory and respiratory systems: Secondary | ICD-10-CM | POA: Diagnosis not present

## 2019-08-11 DIAGNOSIS — I5022 Chronic systolic (congestive) heart failure: Secondary | ICD-10-CM | POA: Diagnosis not present

## 2019-08-11 HISTORY — DX: Encounter for fitting and adjustment of urinary device: Z46.6

## 2019-08-11 HISTORY — PX: AMPUTATION: SHX166

## 2019-08-11 HISTORY — DX: Anxiety disorder, unspecified: F41.9

## 2019-08-11 LAB — COMPREHENSIVE METABOLIC PANEL
ALT: 43 U/L (ref 0–44)
AST: 35 U/L (ref 15–41)
Albumin: 3.4 g/dL — ABNORMAL LOW (ref 3.5–5.0)
Alkaline Phosphatase: 77 U/L (ref 38–126)
Anion gap: 11 (ref 5–15)
BUN: 27 mg/dL — ABNORMAL HIGH (ref 8–23)
CO2: 30 mmol/L (ref 22–32)
Calcium: 9.4 mg/dL (ref 8.9–10.3)
Chloride: 95 mmol/L — ABNORMAL LOW (ref 98–111)
Creatinine, Ser: 1.2 mg/dL (ref 0.61–1.24)
GFR calc Af Amer: >60 mL/min (ref >60)
GFR calc non Af Amer: 54 mL/min — ABNORMAL LOW (ref >60)
Glucose, Bld: 96 mg/dL (ref 70–99)
Potassium: 4.1 mmol/L (ref 3.5–5.1)
Sodium: 136 mmol/L (ref 135–145)
Total Bilirubin: 0.8 mg/dL (ref 0.3–1.2)
Total Protein: 6 g/dL — ABNORMAL LOW (ref 6.5–8.1)

## 2019-08-11 LAB — URINALYSIS, ROUTINE W REFLEX MICROSCOPIC
Bilirubin Urine: NEGATIVE
Glucose, UA: NEGATIVE mg/dL
Ketones, ur: NEGATIVE mg/dL
Nitrite: NEGATIVE
Protein, ur: NEGATIVE mg/dL
Specific Gravity, Urine: 1.011 (ref 1.005–1.030)
WBC, UA: >50 WBC/hpf — ABNORMAL HIGH (ref 0–5)
pH: 6 (ref 5.0–8.0)

## 2019-08-11 LAB — PROTIME-INR
INR: 1 (ref 0.8–1.2)
Prothrombin Time: 13.1 seconds (ref 11.4–15.2)

## 2019-08-11 LAB — MRSA PCR SCREENING: MRSA by PCR: NEGATIVE

## 2019-08-11 LAB — CBC
HCT: 36 % — ABNORMAL LOW (ref 39.0–52.0)
Hemoglobin: 11.2 g/dL — ABNORMAL LOW (ref 13.0–17.0)
MCH: 31 pg (ref 26.0–34.0)
MCHC: 31.1 g/dL (ref 30.0–36.0)
MCV: 99.7 fL (ref 80.0–100.0)
Platelets: 281 10*3/uL (ref 150–400)
RBC: 3.61 MIL/uL — ABNORMAL LOW (ref 4.22–5.81)
RDW: 12.9 % (ref 11.5–15.5)
WBC: 12.9 10*3/uL — ABNORMAL HIGH (ref 4.0–10.5)
nRBC: 0 % (ref 0.0–0.2)

## 2019-08-11 LAB — TYPE AND SCREEN
ABO/RH(D): B POS
Antibody Screen: NEGATIVE

## 2019-08-11 LAB — ABO/RH: ABO/RH(D): B POS

## 2019-08-11 LAB — APTT: aPTT: 31 seconds (ref 24–36)

## 2019-08-11 SURGERY — AMPUTATION, ABOVE KNEE
Anesthesia: Spinal | Site: Knee | Laterality: Right

## 2019-08-11 MED ORDER — FLUTICASONE PROPIONATE 50 MCG/ACT NA SUSP: 2.0000 | Freq: Every day | NASAL | Status: DC

## 2019-08-11 MED ORDER — VANCOMYCIN HCL IN DEXTROSE 1-5 GM/200ML-% IV SOLN
1000.0000 mg | Freq: Two times a day (BID) | INTRAVENOUS | Status: AC
  Administered 2019-08-11 – 2019-08-12 (×2): 1000 mg via INTRAVENOUS
  Filled 2019-08-11 (×2): qty 200

## 2019-08-11 MED ORDER — PHENOL 1.4 % MT LIQD: 1.0000 | OROMUCOSAL | Status: DC | PRN

## 2019-08-11 MED ORDER — ACETAMINOPHEN 500 MG PO TABS
ORAL_TABLET | ORAL | Status: AC
  Filled 2019-08-11: qty 2

## 2019-08-11 MED ORDER — FENTANYL CITRATE (PF) 100 MCG/2ML IJ SOLN
INTRAMUSCULAR | Status: AC
  Filled 2019-08-11: qty 2

## 2019-08-11 MED ORDER — FUROSEMIDE 20 MG PO TABS
20.0000 mg | ORAL_TABLET | Freq: Two times a day (BID) | ORAL | Status: DC
  Administered 2019-08-11 – 2019-08-14 (×6): 20 mg via ORAL
  Filled 2019-08-11 (×6): qty 1

## 2019-08-11 MED ORDER — TIOTROPIUM BROMIDE MONOHYDRATE 2.5 MCG/ACT IN AERS: 2.0000 | INHALATION_SPRAY | Freq: Every day | RESPIRATORY_TRACT | Status: DC

## 2019-08-11 MED ORDER — CHLORHEXIDINE GLUCONATE CLOTH 2 % EX PADS: 6.0000 | MEDICATED_PAD | Freq: Once | CUTANEOUS | Status: DC

## 2019-08-11 MED ORDER — FLUTICASONE FUROATE-VILANTEROL 200-25 MCG/INH IN AEPB
1.0000 | INHALATION_SPRAY | Freq: Every day | RESPIRATORY_TRACT | Status: DC
  Filled 2019-08-11: qty 28

## 2019-08-11 MED ORDER — KETAMINE HCL 50 MG/5ML IJ SOSY
PREFILLED_SYRINGE | INTRAMUSCULAR | Status: AC
  Filled 2019-08-11: qty 5

## 2019-08-11 MED ORDER — VANCOMYCIN HCL 1000 MG IV SOLR
INTRAVENOUS | Status: DC | PRN
  Administered 2019-08-11: 1000 mg via INTRAVENOUS

## 2019-08-11 MED ORDER — SPIRONOLACTONE 12.5 MG HALF TABLET
12.5000 mg | ORAL_TABLET | ORAL | Status: DC
  Administered 2019-08-11 – 2019-08-13 (×3): 12.5 mg via ORAL
  Filled 2019-08-11: qty 1

## 2019-08-11 MED ORDER — ONDANSETRON HCL 4 MG/2ML IJ SOLN: 4.0000 mg | Freq: Four times a day (QID) | INTRAMUSCULAR | Status: DC | PRN

## 2019-08-11 MED ORDER — FENTANYL CITRATE (PF) 100 MCG/2ML IJ SOLN
25.0000 ug | INTRAMUSCULAR | Status: DC | PRN
  Administered 2019-08-11 (×3): 25 ug via INTRAVENOUS

## 2019-08-11 MED ORDER — ALBUTEROL SULFATE (2.5 MG/3ML) 0.083% IN NEBU
2.5000 mg | INHALATION_SOLUTION | RESPIRATORY_TRACT | Status: DC
  Filled 2019-08-11: qty 3

## 2019-08-11 MED ORDER — UMECLIDINIUM BROMIDE 62.5 MCG/INH IN AEPB
1.0000 | INHALATION_SPRAY | Freq: Every day | RESPIRATORY_TRACT | Status: DC
  Administered 2019-08-12: 09:00:00 1 via RESPIRATORY_TRACT
  Filled 2019-08-11 (×3): qty 7

## 2019-08-11 MED ORDER — NITROGLYCERIN 0.4 MG SL SUBL
0.4000 mg | SUBLINGUAL_TABLET | SUBLINGUAL | Status: DC | PRN
  Administered 2019-08-13: 0.4 mg via SUBLINGUAL
  Filled 2019-08-11: qty 1

## 2019-08-11 MED ORDER — HEPARIN SODIUM (PORCINE) 5000 UNIT/ML IJ SOLN
5000.0000 [IU] | Freq: Three times a day (TID) | INTRAMUSCULAR | Status: DC
  Administered 2019-08-12 – 2019-08-14 (×8): 5000 [IU] via SUBCUTANEOUS
  Filled 2019-08-11 (×8): qty 1

## 2019-08-11 MED ORDER — HYDROCODONE-ACETAMINOPHEN 5-325 MG PO TABS
1.0000 | ORAL_TABLET | ORAL | Status: DC | PRN
  Administered 2019-08-11 – 2019-08-12 (×3): 1 via ORAL
  Filled 2019-08-11 (×3): qty 1

## 2019-08-11 MED ORDER — LEVALBUTEROL TARTRATE 45 MCG/ACT IN AERO: 2.0000 | INHALATION_SPRAY | RESPIRATORY_TRACT | Status: DC | PRN

## 2019-08-11 MED ORDER — ALBUTEROL SULFATE (2.5 MG/3ML) 0.083% IN NEBU
INHALATION_SOLUTION | RESPIRATORY_TRACT | Status: AC
  Administered 2019-08-11: 2.5 mg via RESPIRATORY_TRACT
  Filled 2019-08-11: qty 3

## 2019-08-11 MED ORDER — ACETAMINOPHEN 650 MG RE SUPP: 325.0000 mg | RECTAL | Status: DC | PRN

## 2019-08-11 MED ORDER — PHENYLEPHRINE 40 MCG/ML (10ML) SYRINGE FOR IV PUSH (FOR BLOOD PRESSURE SUPPORT)
PREFILLED_SYRINGE | INTRAVENOUS | Status: AC
  Filled 2019-08-11: qty 10

## 2019-08-11 MED ORDER — MAGNESIUM SULFATE 2 GM/50ML IV SOLN: 2.0000 g | Freq: Every day | INTRAVENOUS | Status: DC | PRN

## 2019-08-11 MED ORDER — SODIUM CHLORIDE 0.9 % IV SOLN: INTRAVENOUS | Status: DC

## 2019-08-11 MED ORDER — CHLORHEXIDINE GLUCONATE CLOTH 2 % EX PADS
6.0000 | MEDICATED_PAD | Freq: Every morning | CUTANEOUS | Status: DC
  Administered 2019-08-12 – 2019-08-14 (×3): 6 via TOPICAL

## 2019-08-11 MED ORDER — NON FORMULARY
160.0000 mg | Freq: Two times a day (BID) | Status: DC
  Administered 2019-08-11: 160 mg via RESPIRATORY_TRACT

## 2019-08-11 MED ORDER — METOPROLOL SUCCINATE ER 25 MG PO TB24
12.5000 mg | ORAL_TABLET | Freq: Every day | ORAL | Status: DC
  Administered 2019-08-12 – 2019-08-14 (×3): 12.5 mg via ORAL
  Filled 2019-08-11 (×3): qty 1

## 2019-08-11 MED ORDER — 0.9 % SODIUM CHLORIDE (POUR BTL) OPTIME
TOPICAL | Status: DC | PRN
  Administered 2019-08-11: 1000 mL

## 2019-08-11 MED ORDER — SENNOSIDES-DOCUSATE SODIUM 8.6-50 MG PO TABS: 1.0000 | ORAL_TABLET | Freq: Every evening | ORAL | Status: DC | PRN

## 2019-08-11 MED ORDER — PROPOFOL 500 MG/50ML IV EMUL
INTRAVENOUS | Status: DC | PRN
Start: 2019-08-11 — End: 2019-08-11
  Administered 2019-08-11: 5 mg via INTRAVENOUS
  Administered 2019-08-11: 10 mg via INTRAVENOUS
  Administered 2019-08-11: 5 mg via INTRAVENOUS

## 2019-08-11 MED ORDER — FAMOTIDINE 20 MG PO TABS
20.0000 mg | ORAL_TABLET | Freq: Every day | ORAL | Status: DC
  Administered 2019-08-11 – 2019-08-13 (×3): 20 mg via ORAL
  Filled 2019-08-11 (×3): qty 1

## 2019-08-11 MED ORDER — FLUTICASONE PROPIONATE 50 MCG/ACT NA SUSP
2.0000 | Freq: Every day | NASAL | Status: DC
  Administered 2019-08-11 – 2019-08-14 (×3): 2 via NASAL
  Filled 2019-08-11: qty 16

## 2019-08-11 MED ORDER — ASPIRIN EC 81 MG PO TBEC
81.0000 mg | DELAYED_RELEASE_TABLET | Freq: Every day | ORAL | Status: DC
  Administered 2019-08-12 – 2019-08-14 (×3): 81 mg via ORAL
  Filled 2019-08-11 (×3): qty 1

## 2019-08-11 MED ORDER — DOCUSATE SODIUM 100 MG PO CAPS
100.0000 mg | ORAL_CAPSULE | Freq: Every day | ORAL | Status: DC
  Administered 2019-08-12 – 2019-08-14 (×3): 100 mg via ORAL
  Filled 2019-08-11 (×3): qty 1

## 2019-08-11 MED ORDER — ATORVASTATIN CALCIUM 10 MG PO TABS
20.0000 mg | ORAL_TABLET | Freq: Every day | ORAL | Status: DC
  Administered 2019-08-11 – 2019-08-13 (×3): 20 mg via ORAL
  Filled 2019-08-11 (×3): qty 2

## 2019-08-11 MED ORDER — GUAIFENESIN-DM 100-10 MG/5ML PO SYRP: 15.0000 mL | ORAL_SOLUTION | ORAL | Status: DC | PRN

## 2019-08-11 MED ORDER — ALPRAZOLAM 0.25 MG PO TABS
0.1250 mg | ORAL_TABLET | Freq: Three times a day (TID) | ORAL | Status: DC | PRN
  Administered 2019-08-11 – 2019-08-12 (×3): 0.25 mg via ORAL
  Administered 2019-08-12: 0.125 mg via ORAL
  Administered 2019-08-13: 0.25 mg via ORAL
  Filled 2019-08-11 (×6): qty 1

## 2019-08-11 MED ORDER — ACETAMINOPHEN 325 MG PO TABS
325.0000 mg | ORAL_TABLET | ORAL | Status: DC | PRN
  Administered 2019-08-12 – 2019-08-14 (×7): 650 mg via ORAL
  Filled 2019-08-11 (×8): qty 2

## 2019-08-11 MED ORDER — SODIUM CHLORIDE (HYPERTONIC) 5 % OP SOLN
1.0000 [drp] | Freq: Four times a day (QID) | OPHTHALMIC | Status: DC
  Administered 2019-08-11 – 2019-08-14 (×12): 1 [drp] via OPHTHALMIC
  Filled 2019-08-11: qty 15

## 2019-08-11 MED ORDER — BUPIVACAINE IN DEXTROSE 0.75-8.25 % IT SOLN
INTRATHECAL | Status: DC | PRN
  Administered 2019-08-11: 1.8 mL via INTRATHECAL

## 2019-08-11 MED ORDER — CHLORHEXIDINE GLUCONATE 0.12 % MT SOLN
15.0000 mL | Freq: Once | OROMUCOSAL | Status: AC
  Administered 2019-08-11: 15 mL via OROMUCOSAL
  Filled 2019-08-11: qty 15

## 2019-08-11 MED ORDER — ALBUTEROL SULFATE (2.5 MG/3ML) 0.083% IN NEBU
2.5000 mg | INHALATION_SOLUTION | Freq: Once | RESPIRATORY_TRACT | Status: AC
  Filled 2019-08-11: qty 3

## 2019-08-11 MED ORDER — METOPROLOL TARTRATE 5 MG/5ML IV SOLN: 2.0000 mg | INTRAVENOUS | Status: DC | PRN

## 2019-08-11 MED ORDER — ACETAMINOPHEN 500 MG PO TABS
1000.0000 mg | ORAL_TABLET | Freq: Once | ORAL | Status: AC
  Administered 2019-08-11: 1000 mg via ORAL
  Filled 2019-08-11: qty 2

## 2019-08-11 MED ORDER — VANCOMYCIN HCL IN DEXTROSE 1-5 GM/200ML-% IV SOLN
1000.0000 mg | INTRAVENOUS | Status: AC
  Administered 2019-08-11: 1000 mg via INTRAVENOUS
  Filled 2019-08-11: qty 200

## 2019-08-11 MED ORDER — OXYCODONE-ACETAMINOPHEN 5-325 MG PO TABS
ORAL_TABLET | ORAL | Status: AC
  Filled 2019-08-11: qty 2

## 2019-08-11 MED ORDER — SPIRONOLACTONE 12.5 MG HALF TABLET: 12.5000 mg | ORAL_TABLET | ORAL | Status: DC

## 2019-08-11 MED ORDER — PROPOFOL 10 MG/ML IV BOLUS
INTRAVENOUS | Status: AC
  Filled 2019-08-11: qty 20

## 2019-08-11 MED ORDER — BACITRACIN ZINC 500 UNIT/GM EX OINT
TOPICAL_OINTMENT | CUTANEOUS | Status: DC | PRN
  Administered 2019-08-11: 1 via TOPICAL

## 2019-08-11 MED ORDER — ONDANSETRON HCL 4 MG/2ML IJ SOLN
INTRAMUSCULAR | Status: DC | PRN
Start: 2019-08-11 — End: 2019-08-11
  Administered 2019-08-11: 4 mg via INTRAVENOUS

## 2019-08-11 MED ORDER — ORAL CARE MOUTH RINSE: 15.0000 mL | Freq: Once | OROMUCOSAL | Status: AC

## 2019-08-11 MED ORDER — BUDESONIDE-FORMOTEROL FUMARATE 160-4.5 MCG/ACT IN AERO
2.0000 | INHALATION_SPRAY | Freq: Two times a day (BID) | RESPIRATORY_TRACT | Status: DC
  Administered 2019-08-11 – 2019-08-13 (×5): 2 via RESPIRATORY_TRACT

## 2019-08-11 MED ORDER — AZITHROMYCIN 250 MG PO TABS
250.0000 mg | ORAL_TABLET | Freq: Every day | ORAL | Status: DC
  Administered 2019-08-12 – 2019-08-13 (×2): 250 mg via ORAL
  Filled 2019-08-11 (×2): qty 1

## 2019-08-11 MED ORDER — PREDNISONE 10 MG PO TABS
10.0000 mg | ORAL_TABLET | Freq: Every morning | ORAL | Status: DC
  Administered 2019-08-12 – 2019-08-14 (×2): 10 mg via ORAL
  Filled 2019-08-11 (×2): qty 1

## 2019-08-11 MED ORDER — FINASTERIDE 5 MG PO TABS
5.0000 mg | ORAL_TABLET | Freq: Every day | ORAL | Status: DC
  Administered 2019-08-12 – 2019-08-14 (×3): 5 mg via ORAL
  Filled 2019-08-11 (×3): qty 1

## 2019-08-11 MED ORDER — FENTANYL CITRATE (PF) 250 MCG/5ML IJ SOLN
INTRAMUSCULAR | Status: AC
  Filled 2019-08-11: qty 5

## 2019-08-11 MED ORDER — ALUM & MAG HYDROXIDE-SIMETH 200-200-20 MG/5ML PO SUSP: 15.0000 mL | ORAL | Status: DC | PRN

## 2019-08-11 MED ORDER — MORPHINE SULFATE (PF) 2 MG/ML IV SOLN: 2.0000 mg | INTRAVENOUS | Status: DC | PRN

## 2019-08-11 MED ORDER — OXYCODONE-ACETAMINOPHEN 5-325 MG PO TABS
1.0000 | ORAL_TABLET | ORAL | Status: DC | PRN
  Administered 2019-08-11: 2 via ORAL

## 2019-08-11 MED ORDER — HYDRALAZINE HCL 20 MG/ML IJ SOLN: 5.0000 mg | INTRAMUSCULAR | Status: DC | PRN

## 2019-08-11 MED ORDER — BACITRACIN ZINC 500 UNIT/GM EX OINT
TOPICAL_OINTMENT | CUTANEOUS | Status: AC
  Filled 2019-08-11: qty 28.35

## 2019-08-11 MED ORDER — SPIRONOLACTONE 25 MG PO TABS
25.0000 mg | ORAL_TABLET | ORAL | Status: DC
  Administered 2019-08-14: 25 mg via ORAL
  Filled 2019-08-11 (×4): qty 1

## 2019-08-11 MED ORDER — LIDOCAINE HCL (CARDIAC) PF 50 MG/5ML IV SOSY
PREFILLED_SYRINGE | INTRAVENOUS | Status: DC | PRN
  Administered 2019-08-11: 40 mg via INTRAVENOUS

## 2019-08-11 MED ORDER — CYCLOSPORINE 0.05 % OP EMUL
1.0000 [drp] | Freq: Two times a day (BID) | OPHTHALMIC | Status: DC
  Administered 2019-08-11 – 2019-08-14 (×5): 1 [drp] via OPHTHALMIC
  Filled 2019-08-11 (×8): qty 1

## 2019-08-11 MED ORDER — LACTATED RINGERS IV SOLN: INTRAVENOUS | Status: DC | PRN

## 2019-08-11 MED ORDER — LEVALBUTEROL HCL 0.63 MG/3ML IN NEBU: 0.6300 mg | INHALATION_SOLUTION | Freq: Four times a day (QID) | RESPIRATORY_TRACT | Status: DC | PRN

## 2019-08-11 MED ORDER — LABETALOL HCL 5 MG/ML IV SOLN: 10.0000 mg | INTRAVENOUS | Status: DC | PRN

## 2019-08-11 MED ORDER — POTASSIUM CHLORIDE CRYS ER 20 MEQ PO TBCR: 20.0000 meq | EXTENDED_RELEASE_TABLET | Freq: Every day | ORAL | Status: DC | PRN

## 2019-08-11 MED ORDER — PANTOPRAZOLE SODIUM 40 MG PO TBEC
40.0000 mg | DELAYED_RELEASE_TABLET | Freq: Every day | ORAL | Status: DC
  Administered 2019-08-11 – 2019-08-13 (×3): 40 mg via ORAL
  Filled 2019-08-11 (×3): qty 1

## 2019-08-11 SURGICAL SUPPLY — 54 items
BANDAGE ESMARK 6X9 LF (GAUZE/BANDAGES/DRESSINGS) ×1 IMPLANT
BLADE SAW RECIP 87.9 MT (BLADE) ×2 IMPLANT
BNDG CMPR 9X6 STRL LF SNTH (GAUZE/BANDAGES/DRESSINGS) ×1
BNDG COHESIVE 6X5 TAN STRL LF (GAUZE/BANDAGES/DRESSINGS) ×2 IMPLANT
BNDG ELASTIC 4X5.8 VLCR STR LF (GAUZE/BANDAGES/DRESSINGS) ×2 IMPLANT
BNDG ESMARK 6X9 LF (GAUZE/BANDAGES/DRESSINGS) ×2
BNDG GAUZE ELAST 4 BULKY (GAUZE/BANDAGES/DRESSINGS) ×2 IMPLANT
CANISTER SUCT 3000ML PPV (MISCELLANEOUS) ×2 IMPLANT
CLIP VESOCCLUDE MED 6/CT (CLIP) ×2 IMPLANT
COVER SURGICAL LIGHT HANDLE (MISCELLANEOUS) ×2 IMPLANT
COVER WAND RF STERILE (DRAPES) IMPLANT
CUFF TOURN SGL QUICK 18X4 (TOURNIQUET CUFF) IMPLANT
CUFF TOURN SGL QUICK 24 (TOURNIQUET CUFF) ×2
CUFF TOURN SGL QUICK 34 (TOURNIQUET CUFF)
CUFF TOURN SGL QUICK 42 (TOURNIQUET CUFF) IMPLANT
CUFF TRNQT CYL 24X4X16.5-23 (TOURNIQUET CUFF) ×1 IMPLANT
CUFF TRNQT CYL 34X4.125X (TOURNIQUET CUFF) IMPLANT
DRAIN CHANNEL 19F RND (DRAIN) IMPLANT
DRAPE HALF SHEET 40X57 (DRAPES) ×2 IMPLANT
DRAPE ORTHO SPLIT 77X108 STRL (DRAPES) ×4
DRAPE SURG ORHT 6 SPLT 77X108 (DRAPES) ×2 IMPLANT
DRAPE U-SHAPE 47X51 STRL (DRAPES) ×2 IMPLANT
DRSG ADAPTIC 3X8 NADH LF (GAUZE/BANDAGES/DRESSINGS) ×2 IMPLANT
ELECT CAUTERY BLADE 6.4 (BLADE) ×2 IMPLANT
ELECT REM PT RETURN 9FT ADLT (ELECTROSURGICAL) ×2
ELECTRODE REM PT RTRN 9FT ADLT (ELECTROSURGICAL) ×1 IMPLANT
EVACUATOR SILICONE 100CC (DRAIN) IMPLANT
GAUZE SPONGE 4X4 12PLY STRL (GAUZE/BANDAGES/DRESSINGS) IMPLANT
GAUZE SPONGE 4X4 12PLY STRL LF (GAUZE/BANDAGES/DRESSINGS) ×2 IMPLANT
GLOVE BIO SURGEON STRL SZ7.5 (GLOVE) ×2 IMPLANT
GLOVE BIOGEL PI IND STRL 8 (GLOVE) ×1 IMPLANT
GLOVE BIOGEL PI INDICATOR 8 (GLOVE) ×1
GOWN STRL REUS W/ TWL LRG LVL3 (GOWN DISPOSABLE) ×4 IMPLANT
GOWN STRL REUS W/TWL LRG LVL3 (GOWN DISPOSABLE) ×8
KIT BASIN OR (CUSTOM PROCEDURE TRAY) ×2 IMPLANT
KIT TURNOVER KIT B (KITS) ×2 IMPLANT
NS IRRIG 1000ML POUR BTL (IV SOLUTION) ×2 IMPLANT
PACK GENERAL/GYN (CUSTOM PROCEDURE TRAY) ×2 IMPLANT
PAD ARMBOARD 7.5X6 YLW CONV (MISCELLANEOUS) ×4 IMPLANT
RASP HELIOCORDIAL MED (MISCELLANEOUS) IMPLANT
STAPLER VISISTAT (STAPLE) IMPLANT
STOCKINETTE IMPERVIOUS LG (DRAPES) ×2 IMPLANT
SUT ETHILON 3 0 PS 1 (SUTURE) IMPLANT
SUT SILK 0 TIES 10X30 (SUTURE) ×2 IMPLANT
SUT SILK 2 0 (SUTURE) ×2
SUT SILK 2 0 SH CR/8 (SUTURE) ×2 IMPLANT
SUT SILK 2-0 18XBRD TIE 12 (SUTURE) ×1 IMPLANT
SUT SILK 3 0 (SUTURE) ×2
SUT SILK 3-0 18XBRD TIE 12 (SUTURE) ×1 IMPLANT
SUT VIC AB 2-0 CT1 18 (SUTURE) ×2 IMPLANT
TOWEL GREEN STERILE (TOWEL DISPOSABLE) ×4 IMPLANT
TOWEL GREEN STERILE FF (TOWEL DISPOSABLE) ×2 IMPLANT
UNDERPAD 30X36 HEAVY ABSORB (UNDERPADS AND DIAPERS) ×4 IMPLANT
WATER STERILE IRR 1000ML POUR (IV SOLUTION) ×2 IMPLANT

## 2019-08-11 NOTE — Plan of Care (Signed)
Initiate Care Plan Problem: Education: Goal: Knowledge of General Education information will improve Description: Including pain rating scale, medication(s)/side effects and non-pharmacologic comfort measures Outcome: Progressing   Problem: Health Behavior/Discharge Planning: Goal: Ability to manage health-related needs will improve Outcome: Progressing   Problem: Clinical Measurements: Goal: Ability to maintain clinical measurements within normal limits will improve Outcome: Progressing Goal: Will remain free from infection Outcome: Progressing Goal: Diagnostic test results will improve Outcome: Progressing Goal: Respiratory complications will improve Outcome: Progressing Goal: Cardiovascular complication will be avoided Outcome: Progressing   Problem: Activity: Goal: Risk for activity intolerance will decrease Outcome: Progressing   Problem: Nutrition: Goal: Adequate nutrition will be maintained Outcome: Progressing   Problem: Coping: Goal: Level of anxiety will decrease Outcome: Progressing   Problem: Elimination: Goal: Will not experience complications related to bowel motility Outcome: Progressing Goal: Will not experience complications related to urinary retention Outcome: Progressing   Problem: Pain Managment: Goal: General experience of comfort will improve Outcome: Progressing   Problem: Safety: Goal: Ability to remain free from injury will improve Outcome: Progressing   Problem: Skin Integrity: Goal: Risk for impaired skin integrity will decrease Outcome: Progressing   Problem: Education: Goal: Required Educational Video(s) Outcome: Progressing   Problem: Clinical Measurements: Goal: Postoperative complications will be avoided or minimized Outcome: Progressing   Problem: Skin Integrity: Goal: Demonstration of wound healing without infection will improve Outcome: Progressing

## 2019-08-11 NOTE — Progress Notes (Signed)
Patient placed on home BIPAP for tonight.

## 2019-08-11 NOTE — H&P (Signed)
History and Physical    [name] ZLD:[id number] DOB: 1929/04/05 DOA: 08/11/2019  Referring MD/NP/PA: Bobette Mo PCP: [name], MD  Patient coming from: Home  Chief Complaint: Right leg pain  I have personally briefly reviewed patient's old medical records in [place]   HPI: [name] is a 86 y.o. male with medical history significant of HTN, HLD, COPD, chronic respiratory failure on 2 L of oxygen, CAD s/p CABG, chronic diastolic CHF, PVD s/p right SFA occlusion, PAF, BPH, anxiety depression, and hard of hearing who presented with right leg pain.  Just recently hospitalized 6/18-6/23 for chronic nonhealing wound of the right foot.  Now during hospitalization he was seen by vascular surgery and had aortogram with bilateral lower extremity runoff performed which revealed severe per old vascular disease with multilevel arterial occlusive disease for which there were no revascularization options.  Labs today significant for WBC 12.9, hemoglobin 11.2, BUN 27, and creatinine 1.2.  Urinalysis was positive large leukocytes rare bacteria, and greater than 50 WBCs.  For patient had been given preoperative antibiotics of vancomycin.  Dr.Dickerson of vascular surgery took the patient to the operating room and performed a above-knee amputation.  Patient was able to be successfully extubated and currently on complaint of pain of the right lower extremity.  Review of Systems  Constitutional: Negative for fever and malaise/fatigue.  HENT: Positive for hearing loss. Negative for nosebleeds.   Eyes: Positive for redness. Negative for photophobia and pain.  Respiratory: Negative for shortness of breath.   Cardiovascular: Positive for leg swelling. Negative for chest pain and claudication.  Gastrointestinal: Negative for abdominal pain, constipation, nausea and vomiting.  Genitourinary: Negative for dysuria and hematuria.  Musculoskeletal: Positive for myalgias.  Skin: Negative for itching  and rash.  Neurological: Negative for loss of consciousness.  Psychiatric/Behavioral: Negative for substance abuse.    Past Medical History:  Diagnosis Date  . Anxiety   . Arthritis    "generalized" (04/04/2017)  . Asthma   . Basal cell carcinoma    "left ear"  . BPH (benign prostatic hyperplasia)    severe; s/p multiple biopsies  . CAD (coronary artery disease)   . Carotid artery occlusion   . Chronic respiratory failure ([place])   . Chronic rhinitis   . COPD (chronic obstructive pulmonary disease) (HCC)    2L [place] O2  . Diastolic heart failure ([place]) 2019  . Dilation of biliary tract   . Elevated troponin 03/09/2017  . Gallstones   . GERD (gastroesophageal reflux disease)   . History of blood transfusion    "w/his CABG" (04/04/2017)  . History of kidney stones   . Hyperlipidemia   . Hypertension   . On home oxygen therapy    "~ 24/7" (04/04/2017)  . Peripheral vascular disease ([place])   . Pneumonia 2019  . Squamous cell carcinoma of skin 04/23/2013   in situ-Right flank (txpbx)  . Squamous cell carcinoma of skin 01/23/2017   [place]/ in siut- Left ear rim (CX3FU)  . Syncope and collapse   . Ureteral tumor 08/2015   had endoscopic procedure for evaluation, unable to reach for biopsy  . Urinary catheter (Foley) change required     Past Surgical History:  Procedure Laterality Date  . ABDOMINAL AORTOGRAM W/LOWER EXTREMITY Bilateral 06/30/2019   Procedure: ABDOMINAL AORTOGRAM W/LOWER EXTREMITY;  Surgeon: Waynetta Sandy, MD;  Location: [facility];  Service: Cardiovascular;  Laterality: Bilateral;  . BASAL CELL CARCINOMA EXCISION Left  ear  . CARDIAC CATHETERIZATION     "prior to bypass"  . CATARACT EXTRACTION W/ INTRAOCULAR LENS  IMPLANT, BILATERAL Bilateral   . CORONARY ARTERY BYPASS GRAFT  10/2001   LIMA to LAD, SVG to OM1-2, SVG to RCA and PDA  . LITHOTRIPSY    . PROSTATE BIOPSY  Oct. 2014  . TONSILLECTOMY       reports that he quit smoking about 37  years ago. His smoking use included cigarettes. He has a 66.00 pack-year smoking history. He has never used smokeless tobacco. He reports current alcohol use. He reports that he does not use drugs.  Allergies  Allergen Reactions  . Cefdinir Diarrhea and Other (See Comments)    Severe Diarrhea  . Nitrofurantoin Swelling and Other (See Comments)    Hand Swelling  . Sulfa Antibiotics Anaphylaxis and Swelling  . Sulfonamide Derivatives Swelling and Other (See Comments)    Facial/tongue swelling  . Tape Other (See Comments)    SKIN IS VERY THIN AND TEARS EASILY!!!!! Please do NOT use "plastic" tape- USE PAPER!!  . Ciprofloxacin Itching, Rash and Other (See Comments)    Red itchy hands  . Nitrofurantoin Swelling and Other (See Comments)    Swollen hands  . Amoxicillin Er Other (See Comments)    Frequest urination  . Doxycycline Other (See Comments)    "Felt terrible"  . Levaquin [Levofloxacin] Itching and Rash  . Sertraline Anxiety and Other (See Comments)    Makes the patient jittery    Family History  Problem Relation Age of Onset  . Heart disease Mother   . Heart disease Father        Before age 35  . Breast cancer Mother   . Cancer Mother        Breast cancer  . Hypertension Mother   . Other Mother        AAA  and   Amputation  . Heart attack Father   . Stroke Brother        x3, still living   . Peripheral vascular disease Brother   . Heart disease Brother   . Hyperlipidemia Brother   . Hypertension Brother   . Heart disease Brother   . Allergies Brother     Prior to Admission medications   Medication Sig Start Date End Date Taking? Authorizing Provider  acetaminophen (TYLENOL) 500 MG tablet Take 1,000 mg by mouth every 6 (six) hours as needed for moderate pain.    Yes [provider]  ALPRAZolam (XANAX) 0.25 MG tablet TAKE 1 TABLET BY MOUTH THREE TIMES DAILY AS NEEDED Patient taking differently: Take 0.125-0.25 mg by mouth 3 (three) times daily as needed  for anxiety.  04/21/19  Yes Tanda Rockers, MD  aspirin 81 MG tablet Take 81 mg by mouth daily.   Yes [provider]  atorvastatin (LIPITOR) 20 MG tablet TAKE 1 TABLET BY MOUTH EVERY DAY Patient taking differently: Take 20 mg by mouth at bedtime.  05/24/16  Yes Belva Crome, MD  azithromycin (ZITHROMAX) 250 MG tablet One daily Patient taking differently: Take 250 mg by mouth daily. One daily 07/22/19  Yes Tanda Rockers, MD  Cholecalciferol (VITAMIN D3) 2000 units TABS Take 2,000 Units by mouth daily with lunch.    Yes [provider]  famotidine (PEPCID) 20 MG tablet Take 20 mg by mouth at bedtime.    Yes [provider]  finasteride (PROSCAR) 5 MG tablet Take 5 mg by mouth daily with lunch.  Yes [provider]  fluticasone (FLONASE) 50 MCG/ACT nasal spray Place 2 sprays into both nostrils daily.   Yes [provider]  furosemide (LASIX) 20 MG tablet Take 1 tablet (20 mg total) by mouth 2 (two) times daily. 06/25/19  Yes Burtis Junes, NP  levalbuterol [facility] HFA) 45 MCG/ACT inhaler Inhale 2 puffs into the lungs every 4 (four) hours as needed for wheezing or shortness of breath. 04/14/19  Yes Tanda Rockers, MD  levalbuterol (XOPENEX) 0.63 MG/3ML nebulizer solution USE 1 VIAL VIA NEBULIZER EVERY 4 HOURS AS NEEDED FOR WHEEZING OR SHORTNESS OF BREATH Patient taking differently: Take 0.63 mg by nebulization every 4 (four) hours as needed for wheezing or shortness of breath.  05/10/18  Yes Tanda Rockers, MD  metoprolol succinate (TOPROL-XL) 25 MG 24 hr tablet Take 0.5 tablets (12.5 mg total) by mouth daily. 07/11/19  Yes Belva Crome, MD  mometasone (NASONEX) 50 MCG/ACT nasal spray Place 2 sprays into the nose daily. 04/22/19  Yes Tanda Rockers, MD  morphine (MSIR) 15 MG tablet Take 15 mg by mouth every 4 (four) hours as needed for severe pain.   Yes [provider]  Multiple Vitamins-Minerals (PRESERVISION AREDS 2) CAPS Take 1 capsule by  mouth daily.   Yes [provider]  NITROSTAT 0.4 MG SL tablet Place 0.4 mg under the tongue every 5 (five) minutes as needed for chest pain.   Yes [provider]  OXYGEN Inhale 2 L/min into the lungs continuous.    Yes [provider]  predniSONE (DELTASONE) 10 MG tablet FOR FLARE OF COUGH/WHEEZING MAY INCREASE TO 2 DAILY BETTER, THEN 1 DAILY Patient taking differently: Take 10-20 mg by mouth in the morning. Take 10 mg by mouth in the morning and may increase to 20 mg as needed for coughing/wheezing (then decrease back to 10 mg once daily) 02/25/19  Yes Tanda Rockers, MD  PRESCRIPTION MEDICATION See admin instructions. [place] Trilogy ventilator- At bedtime   Yes [provider]  sodium chloride (MURO 128) 5 % ophthalmic solution Place 1 drop into both eyes 4 (four) times daily.   Yes [provider]  spironolactone (ALDACTONE) 25 MG tablet Take 12.5-25 mg by mouth See admin instructions. 12.5 mg on day and 25 mg the next day, pt alternates dosages 07/15/19  Yes [provider]  SYMBICORT 160-4.5 MCG/ACT inhaler Inhale 2 puffs into the lungs 2 (two) times daily. 06/06/19  Yes Tanda Rockers, MD  Tiotropium Bromide Monohydrate (SPIRIVA RESPIMAT) 2.5 MCG/ACT AERS INHALE 2 PUFFS BY MOUTH EVERY DAY Patient taking differently: Inhale 2 puffs into the lungs daily.  05/14/19  Yes Tanda Rockers, MD  vitamin B-12 (CYANOCOBALAMIN) 1000 MCG tablet Take 1,000 mcg by mouth daily.    Yes [provider]  azithromycin (ZITHROMAX) 250 MG tablet Zpack taper as directed Patient not taking: Reported on 08/08/2019 07/09/19   Martyn Ehrich, NP  silodosin (RAPAFLO) 4 MG CAPS capsule Take 4 mg by mouth daily. 06/17/19   [provider]    Physical Exam:  Constitutional: Elderly male currently in no acute distress Vitals:   08/11/19 1130 08/11/19 1200 08/11/19 1230 08/11/19 1300  BP: 114/62 102/62 (!) 107/59 131/80  Pulse: 80 77 82 89  Resp:  13 14 14 16   Temp:      TempSrc:      SpO2: 100% 100% 100% 100%  Weight:      Height:  Eyes: PERRL, lids and conjunctivae normal ENMT: Mucous membranes are moist. Posterior pharynx clear of any exudate or lesions.  Hard of hearing. Neck: normal, supple, no masses, no thyromegaly Respiratory: Decreased aeration appreciated.  Currently on home 2 L of nasal cannula oxygen Cardiovascular: Regular rate and rhythm, no murmurs / rubs / gallops. No extremity edema. 2+ pedal pulses. No carotid bruits.  Abdomen: no tenderness, no masses palpated. No hepatosplenomegaly. Bowel sounds positive.  Musculoskeletal: no clubbing / cyanosis. No joint deformity upper and lower extremities. Good ROM, no contractures. Normal muscle tone.  Skin: no rashes, lesions, ulcers. No induration Neurologic: CN 2-12 grossly intact. Sensation intact, DTR normal. Strength 5/5 in all 4.  Psychiatric: Normal judgment and insight. Alert and oriented x 3. Normal mood.     Labs on Admission: I have personally reviewed following labs and imaging studies  CBC: Recent Labs  Lab 08/11/19 0715  WBC 12.9*  HGB 11.2*  HCT 36.0*  MCV 99.7  PLT 536   Basic Metabolic Panel: Recent Labs  Lab 08/11/19 0715  NA 136  K 4.1  CL 95*  CO2 30  GLUCOSE 96  BUN 27*  CREATININE 1.20  CALCIUM 9.4   GFR: Estimated Creatinine Clearance: 35 mL/min (by C-G formula based on SCr of 1.2 mg/dL). Liver Function Tests: Recent Labs  Lab 08/11/19 0715  AST 35  ALT 43  ALKPHOS 77  BILITOT 0.8  PROT 6.0*  ALBUMIN 3.4*   No results for input(s): LIPASE, AMYLASE in the last 168 hours. No results for input(s): AMMONIA in the last 168 hours. Coagulation Profile: Recent Labs  Lab 08/11/19 0715  INR 1.0   Cardiac Enzymes: No results for input(s): CKTOTAL, CKMB, CKMBINDEX, TROPONINI in the last 168 hours. BNP (last 3 results) No results for input(s): PROBNP in the last 8760 hours. HbA1C: No results for input(s): HGBA1C in  the last 72 hours. CBG: No results for input(s): GLUCAP in the last 168 hours. Lipid Profile: No results for input(s): CHOL, HDL, LDLCALC, TRIG, CHOLHDL, LDLDIRECT in the last 72 hours. Thyroid Function Tests: No results for input(s): TSH, T4TOTAL, FREET4, T3FREE, THYROIDAB in the last 72 hours. Anemia Panel: No results for input(s): VITAMINB12, FOLATE, FERRITIN, TIBC, IRON, RETICCTPCT in the last 72 hours. Urine analysis:    Component Value Date/Time   COLORURINE YELLOW 08/11/2019 0633   APPEARANCEUR HAZY (A) 08/11/2019 0633   LABSPEC 1.011 08/11/2019 0633   PHURINE 6.0 08/11/2019 0633   GLUCOSEU NEGATIVE 08/11/2019 0633   HGBUR SMALL (A) 08/11/2019 0633   BILIRUBINUR NEGATIVE 08/11/2019 0633   KETONESUR NEGATIVE 08/11/2019 0633   PROTEINUR NEGATIVE 08/11/2019 0633   NITRITE NEGATIVE 08/11/2019 0633   LEUKOCYTESUR LARGE (A) 08/11/2019 0633   Sepsis Labs: Recent Results (from the past 240 hour(s))  SARS CORONAVIRUS 2 (TAT 6-24 HRS) Nasopharyngeal Nasopharyngeal Swab     Status: None   Collection Time: 08/08/19  3:31 PM   Specimen: Nasopharyngeal Swab  Result Value Ref Range Status   SARS Coronavirus 2 NEGATIVE NEGATIVE Final    Comment: (NOTE) SARS-CoV-2 target nucleic acids are NOT DETECTED.  The SARS-CoV-2 RNA is generally detectable in upper and lower respiratory specimens during the acute phase of infection. Negative results do not preclude SARS-CoV-2 infection, do not rule out co-infections with other pathogens, and should not be used as the sole basis for treatment or other patient management decisions. Negative results must be combined with clinical observations, patient history, and epidemiological information. The expected result is Negative.  Fact Sheet for Patients: SugarRoll.be  Fact Sheet for Healthcare Providers: https://www.woods-mathews.com/  This test is not yet approved or cleared by the [place] FDA and    has been authorized for detection and/or diagnosis of SARS-CoV-2 by FDA under an Emergency Use Authorization (EUA). This EUA will remain  in effect (meaning this test can be used) for the duration of the COVID-19 declaration under Se ction 564(b)(1) of the Act, 21 U.S.C. section 360bbb-3(b)(1), unless the authorization is terminated or revoked sooner.  Performed at [facility], [place] [address], [place], [place] [address]      Radiological Exams on Admission: No results found.  EKG: Independently reviewed.  Sinus rhythm with premature atrial complexes.  Assessment/Plan Ischemic right foot secondary to peripheral vascular disease Status post right above-the-knee amputation: Patient with longstanding history of peripheral vascular disease with chronic nonhealing wound of the right lower extremity.  Noted not to be a candidate for revascularization during last hospitalization.  Status post right above-knee amputation performed by Dr.  Bobette Mo today. -Admit to a progressive bed -Continue aspirin and statin -Trial of hydrocodone p.o. as needed for pain.  Patient reportedly becomes very drowsy with IV morphine and does not tolerate it well -Consult wound care -PT/OT consulted -Appreciate vascular surgery, we will follow for any further recommendation  Chronic respiratory failure/COPD: Patient back on home 2 L of nasal cannula oxygen following procedure.  He is on azithromycin 250 daily for prophylaxis.  Home regimen includes prednisone, Symbicort 2 puffs twice daily, Spiriva 2 puffs daily, and levalbuterol shortness of breath/wheezing. -Continuous pulse oximetry with nasal cannula oxygen to maintain O2 saturations -Continue home breathing treatment regimen -Respiratory therapy to apply trilogy machine at night  Abnormal urinalysis: Acute.  Patient was found to have large leukocytes with rare bacteria, and greater than 50 WBCs. -Follow-up urine culture  CAD s/p CABG -Continue  Asa and statin    Chronic diastolic congestive heart failure: Last echocardiogram noted normal LV function 09/2018.  -Strict intake and output -Daily weights  Essential hypertension: Home medications include metoprolol 12.5 mg daily. -Continue metoprolol as tolerated  Chronic kidney disease stage IIIa: Patient presents with creatinine of 1.2 with BUN 27.  Baseline creatinine previously noted to be around 1-1.1.  Suspect mild elevation secondary to being n.p.o. prior to surgery. -Continue to monitor  History of ureteral tumor/right renal calculus: Renal ultrasound from 07/01/89 noted a prominent prostate with lower pole right renal calculus without obstructive changes.  Patient has been recommended to follow-up with urology in the outpatient setting  Anxiety -Continue Xanax as needed  BPH -Continue finasteride -Restart Rapaflo    DVT prophylaxis: Lovenox Code Status:DNR Family Communication Updated  Disposition Plan: To be determined Consults called: Vascular surgery Admission status: Inpatient  Norval Morton MD [facility] Pager [phone]   If 7PM-7AM, please contact night-coverage [email or web address] Password [email or web address]  08/11/2019, 1:42 PM

## 2019-08-11 NOTE — Progress Notes (Signed)
COMMUNITY PALLIATIVE CARE RN NOTE  PATIENT NAME: [name] DOB: 12/02/1929 MRN: [id number]  PRIMARY CARE PROVIDER: [name], MD  RESPONSIBLE PARTY: [name] (wife) Acct ID - Guarantor Home Phone Work Phone Relationship Acct Type  [id number] Travis, [name] [phone]  Self P/F     [address] [place], [place] [address]   Covid-19 Pre-screening  PLAN OF CARE and INTERVENTION:  1. ADVANCE CARE PLANNING/GOALS OF CARE: Goal is for patient to remain in his home for as long as physically possible.  2. PATIENT/CAREGIVER EDUCATION: Symptom management, pain management, safe mobility/transfers, hospice education 3. DISEASE STATUS: Joint visit made with Palliative care SW, [name]. Met with patient, his wife, [name], and son, [name]. Discussed patient's current condition. He is experiencing significant pain in his right foot from ulcers d/t poor circulation. He took a dose of Morphine, but feels that it has caused increased weakness and fatigue. He is having more difficulty making decisions. He is depressed, anxious and son feels his personality has changed.  He did not sleep well last night d/t pain. He is now reconsidering having an amputation. He is currently receiving PT through [facility] and has a Wound care RN for dressing changes 2x/week. He is having more difficulty with ambulation and navigating the stairs. His gait is very unsteady and his wife doesn't feel that she can get him to the car by herself anymore. He is using a cane. They are looking into a chair lift for the stairs. His son wishes they would move into a townhouse they own here in town, as it would be much easier to navigate. They are looking into hired caregivers through [facility]. They have not yet decided on days/times. Provided hospice education. He was evaluated by hospice about 2 weeks ago and met eligibility requirements, however wanted more time to think about this. Discussed what care in  the home looks like along with care in a facility setting. They have LTC insurance and has a meeting to look into what all it covers and how to utilize this. Appears that patient is leaning towards amputation. Explained that more than likely he would go to a rehab facility once released from the hospital then could decide what he wants to do once he has used his skilled Medicare days. Family appreciative of meeting and information. Will continue to monitor.   HISTORY OF PRESENT ILLNESS:  This is a 86 yo male with a h/o CHF, PVD, CAD COPD, and NSTEMI. Palliative care team continues to follow patient and visits monthly and PRN    CODE STATUS: DNR ADVANCED DIRECTIVES: Y MOST FORM: no PPS: 40%   (Duration of visit and documentation 75 minutes)   Travis Eastern, RN BSN

## 2019-08-11 NOTE — Anesthesia Postprocedure Evaluation (Signed)
Anesthesia Post Note  Patient: [name]  Procedure(s) Performed: RIGHT ABOVE KNEE AMPUTATION (Right Knee)     Patient location during evaluation: PACU Anesthesia Type: Spinal Level of consciousness: awake and alert Pain management: pain level controlled Vital Signs Assessment: post-procedure vital signs reviewed and stable Respiratory status: spontaneous breathing and respiratory function stable Cardiovascular status: blood pressure returned to baseline and stable Postop Assessment: spinal receding Anesthetic complications: no   No complications documented.  Last Vitals:  Vitals:   08/11/19 1100 08/11/19 1130  BP: (!) 107/58 114/62  Pulse: 83 80  Resp: 13 13  Temp:    SpO2: 100% 100%    Last Pain:  Vitals:   08/11/19 1130  TempSrc:   PainSc: Tyler Deis

## 2019-08-11 NOTE — Op Note (Signed)
    NAME: [name]    MRN: [id number] DOB: 06-May-1929    DATE OF OPERATION: 08/11/2019  PREOP DIAGNOSIS:    Ischemic right foot  POSTOP DIAGNOSIS:    Same  PROCEDURE:    Right above-the-knee amputation  SURGEON: Judeth Cornfield. Scot Dock, MD  ASSIST: Karoline Caldwell, PA  ANESTHESIA: General  EBL: Minimal  INDICATIONS:    [name] is a 86 y.o. male who is markedly debilitated who has an ischemic right foot with wounds that are nonhealing.  He has undergone arteriography by Dr. Servando Snare with no option for revascularization.  In addition he is very high risk for any procedure including amputation.  However after discussion of palliative care versus primary right above-the-knee amputation he elected to proceed with right above-knee amputation.  FINDINGS:   The muscle appeared well perfused.  TECHNIQUE:   The patient was taken to the operating room and a spinal anesthesia was placed.  The right leg was prepped and draped in usual sterile fashion.  A fishmouth incision was marked above the level of the patella.  A sterile tourniquet had been placed on the upper thigh.  The leg was exsanguinated with an Esmarch bandage.  The tourniquet was inflated to 300 mmHg.  Under tourniquet control, the incision was carried down through the skin, subcutaneous tissue, muscle and fascia to the femur which was dissected free circumferentially.  The periosteum was then elevated and then the bone divided proximal to the level of skin division.  The femoral artery and vein were individually suture ligated with 2-0 silk ties.  The tourniquet was then released.  Additional hemostasis was obtained using electrocautery and 2-0 silk sutures.  The edges of the bone were rasped.  The wound was irrigated.  After hemostasis was obtained the fascial layer was closed with interrupted 2-0 Vicryl's.  The skin was closed with staples.  A sterile dressing was applied.  The patient tolerated the procedure well and was  transferred to the recovery room in stable condition.  All needle and sponge counts were correct.  Given the complexity of the case a first assistant was necessary in order to expedient the procedure and safely perform the technical aspects of the operation.  Deitra Mayo, MD, FACS [facility]  DATE OF DICTATION:   08/11/2019

## 2019-08-11 NOTE — Transfer of Care (Signed)
Immediate Anesthesia Transfer of Care Note  Patient: [name]  Procedure(s) Performed: RIGHT ABOVE KNEE AMPUTATION (Right Knee)  Patient Location: PACU  Anesthesia Type:MAC  Level of Consciousness: awake, alert  and oriented  Airway & Oxygen Therapy: Patient connected to nasal cannula oxygen  Post-op Assessment: Post -op Vital signs reviewed and stable  Post vital signs: stable  Last Vitals:  Vitals Value Taken Time  BP    Temp    Pulse    Resp 20 08/11/19 0928  SpO2    Vitals shown include unvalidated device data.  Last Pain:  Vitals:   08/11/19 0701  TempSrc:   PainSc: 4       Patients Stated Pain Goal: 2 (58/85/02 7741)  Complications: No complications documented.

## 2019-08-11 NOTE — Anesthesia Procedure Notes (Signed)
Spinal  Patient location during procedure: OR Start time: 08/11/2019 8:15 AM End time: 08/11/2019 8:20 AM Staffing Performed: anesthesiologist  Anesthesiologist: Suzette Battiest, MD Preanesthetic Checklist Completed: patient identified, IV checked, site marked, risks and benefits discussed, surgical consent, monitors and equipment checked, pre-op evaluation and timeout performed Spinal Block Patient position: sitting Prep: DuraPrep Patient monitoring: heart rate, cardiac monitor, continuous pulse ox and blood pressure Approach: midline Location: L4-5 Injection technique: single-shot Needle Needle type: Pencan  Needle gauge: 24 G Needle length: 9 cm Assessment Sensory level: T4

## 2019-08-11 NOTE — Interval H&P Note (Signed)
History and Physical Interval Note:  08/11/2019 7:43 AM  [name]  has presented today for surgery, with the diagnosis of CRITICAL RIGHT LOWER EXTREMITY ISCHEMIA.  The various methods of treatment have been discussed with the patient and family. After consideration of risks, benefits and other options for treatment, the patient has consented to  Procedure(s): RIGHT AMPUTATION ABOVE KNEE (Right) as a surgical intervention.  The patient's history has been reviewed, patient examined, no change in status, stable for surgery.  I have reviewed the patient's chart and labs.  Questions were answered to the patient's satisfaction.     Deitra Mayo

## 2019-08-11 NOTE — Telephone Encounter (Signed)
We received several faxed notices from the Triage Staffing Coordinator at [facility].  Patient and his wife keep refusing care when the [facility] Nurse would come to visit (07/08, 07/16, 07/23).  Visits were reduced to once per wk due to continuous refusals and missed visits.

## 2019-08-12 ENCOUNTER — Encounter (HOSPITAL_COMMUNITY): Payer: Self-pay | Admitting: Vascular Surgery

## 2019-08-12 ENCOUNTER — Other Ambulatory Visit: Payer: Self-pay | Admitting: *Deleted

## 2019-08-12 LAB — CBC
HCT: 32 % — ABNORMAL LOW (ref 39.0–52.0)
Hemoglobin: 10.2 g/dL — ABNORMAL LOW (ref 13.0–17.0)
MCH: 31.4 pg (ref 26.0–34.0)
MCHC: 31.9 g/dL (ref 30.0–36.0)
MCV: 98.5 fL (ref 80.0–100.0)
Platelets: 262 10*3/uL (ref 150–400)
RBC: 3.25 MIL/uL — ABNORMAL LOW (ref 4.22–5.81)
RDW: 13 % (ref 11.5–15.5)
WBC: 14.3 10*3/uL — ABNORMAL HIGH (ref 4.0–10.5)
nRBC: 0 % (ref 0.0–0.2)

## 2019-08-12 MED ORDER — HYDROMORPHONE HCL 1 MG/ML IJ SOLN
0.5000 mg | INTRAMUSCULAR | Status: DC | PRN
  Filled 2019-08-12: qty 0.5

## 2019-08-12 MED ORDER — HYDROCODONE-ACETAMINOPHEN 5-325 MG PO TABS: 1.0000 | ORAL_TABLET | ORAL | Status: DC | PRN

## 2019-08-12 NOTE — Progress Notes (Addendum)
Patient ID: [name], male   DOB: 1929-11-21, 86 y.o.   MRN: [id number]  PROGRESS NOTE    [name]  MPN:[id number] DOB: 1929-05-17 DOA: 08/11/2019 PCP: [name], MD   Brief Narrative:  86 y.o. male with medical history significant of HTN, HLD, COPD, chronic respiratory failure on 2 L of oxygen, CAD s/p CABG, chronic diastolic CHF, PVD s/p right SFA occlusion, PAF, BPH, anxiety depression, hard of hearing, recent hospitalization from 06/27/2019-07/02/2019 for chronic nonhealing wound of the right foot and found to have multilevel arterial occlusive disease for which vascular surgery had said that there were no revascularization options presented with right leg pain.  Vascular surgery was consulted and patient underwent right AKA on 08/11/2019 by vascular surgery and subsequently admitted under hospitalist service.  Assessment & Plan:   Ischemic right foot secondary to peripheral vascular disease -Status post right AKA on 08/11/2019 vascular surgery.  Wound care as per vascular surgery recommendations. -She complains of uncontrolled pain around the stump site.  Continue oral narcotics.  Apparently IV morphine the patient very drowsy and patient/family is hesitant to use Dilaudid. -PT/OT eval.  Might need rehab placement.  Chronic hypoxic respiratory failure COPD -Uses 2 L oxygen via nasal cannula at home.  Currently on the same.  Continue azithromycin for prophylaxis.  Also on prednisone 10 mg daily at home. -Continue Dulera, Umeclidinium bromide and as needed nebs.  Leukocytosis -Monitor  CAD status post CABG -Currently stable.  Continue aspirin, statin, metoprolol  Chronic diastolic heart failure -Last echocardiogram on 9/120 showed normal LV function.  Continue strict input and output.  Daily weights.  Fluid restriction.  Continue metoprolol, Lasix and spironolactone.  Outpatient follow-up with cardiology  Essential hypertension -Blood pressure stable.  Continue  antihypertensives as above  Abnormal urinalysis -Not spiking temperatures.  Probably is colonized.  Monitoring off antibiotics.  Chronic renal disease stage III -Baseline creatinine 1-1.1.  Monitor  History of right renal calculus BPH -Outpatient follow-up with urology.  Continue finasteride  Anxiety -Continue Xanax as needed  Generalized deconditioning -PT/OT eval.  Might need rehab placement   DVT prophylaxis: Heparin subcutaneous Code Status: DNR Family Communication: Wife at bedside Disposition Plan: Status is: Inpatient  Remains inpatient appropriate because:Inpatient level of care appropriate due to severity of illness   Dispo: The patient is from: Home              Anticipated d/c is to: SNF.  Might need SNF placement.  Patient/wife interested in rehab.              Anticipated d/c date is: 2 days              Patient currently is not medically stable to d/c.   Consultants: Vascular surgery  Procedures: Right AKA on 08/11/2019  Antimicrobials: Perioperative   Subjective: Patient seen and examined at bedside.  Complains of pain around the stump.  Denies worsening shortness of breath, fever, chest pain or vomiting.  Objective: Vitals:   08/12/19 0026 08/12/19 0321 08/12/19 0618 08/12/19 0721  BP: 132/66 102/64    Pulse: 96 (!) 104    Resp: 20 16    Temp: 99.1 F (37.3 C) 99 F (37.2 C)  98.9 F (37.2 C)  TempSrc: Oral Oral  Oral  SpO2: 100% 98%    Weight:   56.1 kg   Height:        Intake/Output Summary (Last 24 hours) at 08/12/2019 0959 Last data filed at 08/12/2019 [phone]  Gross per 24 hour  Intake 1293.5 ml  Output 675 ml  Net 618.5 ml   Filed Weights   08/11/19 0632 08/12/19 0618  Weight: 58.1 kg 56.1 kg    Examination:  General exam: Appears calm and comfortable.  Elderly male lying in bed.  Hard of hearing.  Currently on 2 L oxygen via nasal cannula Respiratory system: Bilateral decreased breath sounds at bases with some scattered  crackles Cardiovascular system: S1 & S2 heard, Rate controlled Gastrointestinal system: Abdomen is nondistended, soft and nontender. Normal bowel sounds heard. Extremities: No cyanosis, clubbing.  Right AKA dressing present.  Left lower extremity edema present. Central nervous system: Alert and oriented.  Hard of hearing.  Poor historian.  No focal neurological deficits. Moving extremities Skin: No definite ecchymosis/rashes Psychiatry: Could not be properly assessed because patient is a poor historian and hard of hearing    Data Reviewed: I have personally reviewed following labs and imaging studies  CBC: Recent Labs  Lab 08/11/19 0715 08/12/19 0135  WBC 12.9* 14.3*  HGB 11.2* 10.2*  HCT 36.0* 32.0*  MCV 99.7 98.5  PLT 281 244   Basic Metabolic Panel: Recent Labs  Lab 08/11/19 0715  NA 136  K 4.1  CL 95*  CO2 30  GLUCOSE 96  BUN 27*  CREATININE 1.20  CALCIUM 9.4   GFR: Estimated Creatinine Clearance: 33.8 mL/min (by C-G formula based on SCr of 1.2 mg/dL). Liver Function Tests: Recent Labs  Lab 08/11/19 0715  AST 35  ALT 43  ALKPHOS 77  BILITOT 0.8  PROT 6.0*  ALBUMIN 3.4*   No results for input(s): LIPASE, AMYLASE in the last 168 hours. No results for input(s): AMMONIA in the last 168 hours. Coagulation Profile: Recent Labs  Lab 08/11/19 0715  INR 1.0   Cardiac Enzymes: No results for input(s): CKTOTAL, CKMB, CKMBINDEX, TROPONINI in the last 168 hours. BNP (last 3 results) No results for input(s): PROBNP in the last 8760 hours. HbA1C: No results for input(s): HGBA1C in the last 72 hours. CBG: No results for input(s): GLUCAP in the last 168 hours. Lipid Profile: No results for input(s): CHOL, HDL, LDLCALC, TRIG, CHOLHDL, LDLDIRECT in the last 72 hours. Thyroid Function Tests: No results for input(s): TSH, T4TOTAL, FREET4, T3FREE, THYROIDAB in the last 72 hours. Anemia Panel: No results for input(s): VITAMINB12, FOLATE, FERRITIN, TIBC, IRON,  RETICCTPCT in the last 72 hours. Sepsis Labs: No results for input(s): PROCALCITON, LATICACIDVEN in the last 168 hours.  Recent Results (from the past 240 hour(s))  SARS CORONAVIRUS 2 (TAT 6-24 HRS) Nasopharyngeal Nasopharyngeal Swab     Status: None   Collection Time: 08/08/19  3:31 PM   Specimen: Nasopharyngeal Swab  Result Value Ref Range Status   SARS Coronavirus 2 NEGATIVE NEGATIVE Final    Comment: (NOTE) SARS-CoV-2 target nucleic acids are NOT DETECTED.  The SARS-CoV-2 RNA is generally detectable in upper and lower respiratory specimens during the acute phase of infection. Negative results do not preclude SARS-CoV-2 infection, do not rule out co-infections with other pathogens, and should not be used as the sole basis for treatment or other patient management decisions. Negative results must be combined with clinical observations, patient history, and epidemiological information. The expected result is Negative.  Fact Sheet for Patients: SugarRoll.be  Fact Sheet for Healthcare Providers: https://www.woods-mathews.com/  This test is not yet approved or cleared by the [place] FDA and  has been authorized for detection and/or diagnosis of SARS-CoV-2 by FDA under an Emergency Use  Authorization (EUA). This EUA will remain  in effect (meaning this test can be used) for the duration of the COVID-19 declaration under Se ction 564(b)(1) of the Act, 21 U.S.C. section 360bbb-3(b)(1), unless the authorization is terminated or revoked sooner.  Performed at [facility], [place] [address], [place], [place] [address]   MRSA PCR Screening     Status: None   Collection Time: 08/11/19  7:33 PM   Specimen: Nasal Mucosa; Nasopharyngeal  Result Value Ref Range Status   MRSA by PCR NEGATIVE NEGATIVE Final    Comment:        The GeneXpert MRSA Assay (FDA approved for NASAL specimens only), is one component of a comprehensive MRSA  colonization surveillance program. It is not intended to diagnose MRSA infection nor to guide or monitor treatment for MRSA infections. Performed at [facility], [place] [address], [place], [place] [address]          Radiology Studies: No results found.      Scheduled Meds: . aspirin EC  81 mg Oral Daily  . atorvastatin  20 mg Oral QHS  . azithromycin  250 mg Oral Daily  . budesonide-formoterol  2 puff Inhalation BID  . Chlorhexidine Gluconate Cloth  6 each Topical q morning - 10a  . cycloSPORINE  1 drop Both Eyes BID  . docusate sodium  100 mg Oral Daily  . famotidine  20 mg Oral QHS  . finasteride  5 mg Oral Q lunch  . fluticasone  2 spray Each Nare Daily  . furosemide  20 mg Oral BID  . heparin  5,000 Units Subcutaneous Q8H  . metoprolol succinate  12.5 mg Oral Daily  . pantoprazole  40 mg Oral Daily  . predniSONE  10 mg Oral q AM  . sodium chloride  1 drop Both Eyes QID  . spironolactone  12.5 mg Oral QODAY  . spironolactone  25 mg Oral QODAY  . umeclidinium bromide  1 puff Inhalation Daily   Continuous Infusions: . sodium chloride 50 mL/hr at 08/11/19 2000  . magnesium sulfate bolus IVPB            Aline August, MD [facility] 08/12/2019, 9:59 AM

## 2019-08-12 NOTE — Evaluation (Signed)
Occupational Therapy Evaluation Patient Details Name: [name] MRN: [id number] DOB: Dec 25, 1929 Today's Date: 08/12/2019    History of Present Illness 86 yo admitted with RLE pain with arterial occlusion without option for revascularization s/p Rt AKA. PMHx: COPD on home 2L, CAD s/p CABG, CHF, PVD, PAF, HTN, HLD, BPH, depression/anxiety, HOH   Clinical Impression   Patient is s/p R AKA surgery resulting in functional limitations due to the deficits listed below (see OT problem list). Pt currently limited by fatigue but agreeable to some bed level task. Pt with medication provided just prior to session that could contribute to increased fatigue after eating his lunch.  Patient will benefit from skilled OT acutely to increase independence and safety with ADLS to allow discharge CIR.     Follow Up Recommendations  CIR    Equipment Recommendations  Wheelchair (measurements OT);Wheelchair cushion (measurements OT);Hospital bed    Recommendations for Other Services Rehab consult     Precautions / Restrictions Precautions Precautions: Fall Precaution Comments: Rt AKA      Mobility Bed Mobility Overal bed mobility: Needs Assistance Bed Mobility: Rolling Rolling: Min assist   Supine to sit: HOB elevated;Min assist     General bed mobility comments: declines oob or eob at this time due to fatigue. pt reports i really am just so tired from the day.   Transfers Overall transfer level: Needs assistance   Transfers: Sit to/from Stand Sit to Stand: Min assist         General transfer comment: declined will attempt next session    Balance Overall balance assessment: Needs assistance Sitting-balance support: Feet supported;No upper extremity supported Sitting balance-Leahy Scale: Fair     Standing balance support: Bilateral upper extremity supported Standing balance-Leahy Scale: Poor                             ADL either performed or assessed with clinical  judgement   ADL Overall ADL's : Needs assistance/impaired Eating/Feeding: Minimal assistance;Bed level Eating/Feeding Details (indicate cue type and reason): eating lunch on arrival. pt with setup by wife for self feeding.  Grooming: Dance movement psychotherapist;Set up;Bed level                                 General ADL Comments: pt eating lunch and education on OT services and OT to return to complete session. RN pre medicated patient as requested. pt was feeling fatigued but agreeable to some bed level task. Pt educated on residual limb tactile input to help with phatom pains. Pt demonstrates hip abduction/ adduction exercise. L LE elevated with pillows activation toes ankle and knee this session in attempt to help with edema management. Pt and wife educated to have patient rotate on his side in the next 2 hours to give pressure relief to buttock.       Vision Baseline Vision/History: Wears glasses Wears Glasses: At all times       Perception     Praxis      Pertinent Vitals/Pain Pain Assessment: Faces Pain Score: 5  Faces Pain Scale: Hurts little more Pain Location: RLE, spasm Pain Descriptors / Indicators: Aching;Guarding;Spasm;Constant Pain Intervention(s): Limited activity within patient's tolerance     Hand Dominance Right   Extremity/Trunk Assessment Upper Extremity Assessment Upper Extremity Assessment: Generalized weakness   Lower Extremity Assessment Lower Extremity Assessment: Defer to PT evaluation RLE Deficits / Details:  AKA with spasms in hip flexors   Cervical / Trunk Assessment Cervical / Trunk Assessment: Kyphotic   Communication Communication Communication: HOH (speak directly into R ear)   Cognition Arousal/Alertness: Lethargic;Suspect due to medications Behavior During Therapy: [facility] for tasks assessed/performed Overall Cognitive Status: Within Functional Limits for tasks assessed Area of Impairment: Following commands;Problem solving                        Following Commands: Follows one step commands with increased time     Problem Solving: Slow processing General Comments: per wife pt is very detail oriented and specific and tends to take a long time to accomplish tasks   General Comments  ace wrap in good position this session on R LE    Exercises     Shoulder Instructions      Home Living Family/patient expects to be discharged to:: Private residence Living Arrangements: Spouse/significant other Available Help at Discharge: Family;Available 24 hours/day Type of Home: House Home Access: Level entry     Home Layout: Multi-level;Bed/bath upstairs Alternate Level Stairs-Number of Steps: 9 to kitchen/ living, 6 additional to bedroom   Bathroom Shower/Tub: Occupational psychologist: Standard     Home Equipment: Environmental consultant - 2 wheels;Cane - single point;Shower seat;Transport chair          Prior Functioning/Environment Level of Independence: Independent with assistive device(s);Needs assistance  Gait / Transfers Assistance Needed: uses RW for mobility the last few weeks, wife ADL's / Homemaking Assistance Needed: wife has been assiting with bathing and dressing            OT Problem List: Decreased strength;Decreased activity tolerance;Impaired balance (sitting and/or standing);Decreased safety awareness;Decreased knowledge of use of DME or AE;Decreased knowledge of precautions;Increased edema      OT Treatment/Interventions: Self-care/ADL training;Therapeutic exercise;Energy conservation;DME and/or AE instruction;Manual therapy;Modalities;Therapeutic activities;Patient/family education;Balance training    OT Goals(Current goals can be found in the care plan section) Acute Rehab OT Goals Patient Stated Goal: be able to return home OT Goal Formulation: With patient/family Time For Goal Achievement: 08/26/19 Potential to Achieve Goals: Good  OT Frequency: Min 3X/week   Barriers to D/C:             Co-evaluation              AM-PAC OT "6 Clicks" Daily Activity     Outcome Measure Help from another person eating meals?: A Little Help from another person taking care of personal grooming?: A Little Help from another person toileting, which includes using toliet, bedpan, or urinal?: A Lot Help from another person bathing (including washing, rinsing, drying)?: A Lot Help from another person to put on and taking off regular upper body clothing?: A Little Help from another person to put on and taking off regular lower body clothing?: A Lot 6 Click Score: 15   End of Session Equipment Utilized During Treatment: Oxygen Nurse Communication: Mobility status;Precautions;Weight bearing status  Activity Tolerance: Patient limited by fatigue Patient left: in bed;with call bell/phone within reach;with family/visitor present  OT Visit Diagnosis: Unsteadiness on feet (R26.81);Muscle weakness (generalized) (M62.81)                Time: 6387-5643 (3295-1884) OT Time Calculation (min): 11 min Charges:  OT General Charges $OT Visit: 1 Visit OT Evaluation $OT Eval Moderate Complexity: 1 Mod   Brynn, OTR/L  Acute Rehabilitation Services Pager: [phone] Office: [phone] .   Jeri Modena 08/12/2019, 3:12 PM

## 2019-08-12 NOTE — Plan of Care (Signed)
  Problem: Education: Goal: Knowledge of General Education information will improve Description: Including pain rating scale, medication(s)/side effects and non-pharmacologic comfort measures Outcome: Progressing   Problem: Health Behavior/Discharge Planning: Goal: Ability to manage health-related needs will improve Outcome: Progressing   Problem: Clinical Measurements: Goal: Will remain free from infection Outcome: Progressing Goal: Diagnostic test results will improve Outcome: Progressing   Problem: Activity: Goal: Risk for activity intolerance will decrease Outcome: Progressing   Problem: Nutrition: Goal: Adequate nutrition will be maintained Outcome: Progressing   Problem: Coping: Goal: Level of anxiety will decrease Outcome: Progressing   Problem: Pain Managment: Goal: General experience of comfort will improve Outcome: Progressing   

## 2019-08-12 NOTE — Patient Outreach (Signed)
[facility]) Care Management  08/12/2019  [name] Nov 05, 1929 [id number]   Noted that member admitted to hospital yesterday for right above knee amputation.  Hospital liaisons notified, will follow up with member/wife pending discharge.  Valente Doctor, [place], MSN [place] [phone]

## 2019-08-12 NOTE — Progress Notes (Addendum)
  Progress Note    08/12/2019 7:37 AM 1 Day Post-Op  Subjective:  C/o pain; says the pain medicine doesn't agree with him.  Tm 99.1 now afebrile  Vitals:   08/12/19 0321 08/12/19 0721  BP: 102/64   Pulse: (!) 104   Resp: 16   Temp: 99 F (37.2 C) 98.9 F (37.2 C)  SpO2: 98%     Physical Exam: Incisions:  Bandage in tact and clean.   CBC    Component Value Date/Time   WBC 14.3 (H) 08/12/2019 0135   RBC 3.25 (L) 08/12/2019 0135   HGB 10.2 (L) 08/12/2019 0135   HGB 12.4 (L) 06/25/2019 1449   HCT 32.0 (L) 08/12/2019 0135   HCT 36.6 (L) 06/25/2019 1449   PLT 262 08/12/2019 0135   PLT 208 06/25/2019 1449   MCV 98.5 08/12/2019 0135   MCV 96 06/25/2019 1449   MCH 31.4 08/12/2019 0135   MCHC 31.9 08/12/2019 0135   RDW 13.0 08/12/2019 0135   RDW 12.2 06/25/2019 1449   LYMPHSABS 0.5 (L) 07/02/2019 0438   LYMPHSABS 0.4 (L) 06/25/2019 1449   MONOABS 0.8 07/02/2019 0438   EOSABS 0.1 07/02/2019 0438   EOSABS 0.0 06/25/2019 1449   BASOSABS 0.0 07/02/2019 0438   BASOSABS 0.1 06/25/2019 1449    BMET    Component Value Date/Time   NA 136 08/11/2019 0715   NA 139 06/25/2019 1449   K 4.1 08/11/2019 0715   CL 95 (L) 08/11/2019 0715   CO2 30 08/11/2019 0715   GLUCOSE 96 08/11/2019 0715   BUN 27 (H) 08/11/2019 0715   BUN 29 (H) 06/25/2019 1449   CREATININE 1.20 08/11/2019 0715   CALCIUM 9.4 08/11/2019 0715   GFRNONAA 54 (L) 08/11/2019 0715   GFRAA >60 08/11/2019 0715    INR    Component Value Date/Time   INR 1.0 08/11/2019 0715     Intake/Output Summary (Last 24 hours) at 08/12/2019 0737 Last data filed at 08/12/2019 0416 Gross per 24 hour  Intake 1693.5 ml  Output 775 ml  Net 918.5 ml     Assessment/Plan:  86 y.o. male is s/p right above knee amputation  1 Day Post-Op  -pt c/o pain and says pain medicine not agreeing with him.  He does not have any IV pain med ordered.  Will order some dilaudid 0.5mg  q4h prn.  He has been getting Vicodin the past 3 doses and  received Oxycodone prior to that.  May need to go with Oxy for now if that did not bother him.  RN is not sure why this was changed & morphine dc'd as he did not get anything in report about this.   Will defer to primary team for po pain meds.  -will take down dressing tomorrow.    Leontine Locket, PA-C [facility] [phone] 08/12/2019 7:37 AM  I have interviewed the patient and examined the patient. I agree with the findings by the PA. Pain issues as described above.  I think this will improve with some IV pain medicine as ordered.  Agree with plans for dressing change tomorrow.  Gae Gallop, MD [phone]

## 2019-08-12 NOTE — Evaluation (Signed)
Physical Therapy Evaluation Patient Details Name: [name] MRN: [id number] DOB: [birth year] Today's Date: 08/12/2019   History of Present Illness  86 yo admitted with RLE pain with arterial occlusion without option for revascularization s/p Rt AKA. PMHx: COPD on home 2L, CAD s/p CABG, CHF, PVD, PAF, HTN, HLD, BPH, depression/anxiety, HOH  Clinical Impression  Pt HOH with wife present throughout session to also assist with providing home setup and PLOF. Pt was walking with walker with assist for bathing/dressing recently. 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 Pt with decreased activity tolerance due to SOB and pain with activity as well as Right hip flexor muscle spasms. Pt with good upper body strength and demonstrates ability to move to EOB and stand. Anticipate pt will be able to hop short distances and perform transfers to [facility] without assist with initial rehab stay. Pt and wife very interested in CIR if possible. Pt with decreased balance, activity tolerance and mobility who will benefit from acute therapy to maximize mobility and function to decrease burden of care.   Pt currently in multilevel home but wife reports there is a room off carport with level entry and bathroom that could have hospital bed and be converted easily to functioning apartment vs option of stair lift that apparently pt and daughter have been discussing PTA.   Pt on 2L throughout session with SpO2 >95%    Follow Up Recommendations CIR;Supervision/Assistance - 24 hour    Equipment Recommendations  Wheelchair (measurements PT);Wheelchair cushion (measurements PT);3in1 (PT)    Recommendations for Other Services OT consult;Rehab consult     Precautions / Restrictions Precautions Precautions: Fall Precaution Comments: Rt AKA      Mobility  Bed Mobility Overal bed mobility: Needs Assistance Bed Mobility: Supine to Sit     Supine to sit: HOB elevated;Min assist     General bed  mobility comments: HOB 30 degrees, min assist to fully elevate trunk from surface, sequential cues and increased time. Pt able to scoot well once fully sitting. Return to bed with assist to achieve midline in bed with cues  Transfers Overall transfer level: Needs assistance   Transfers: Sit to/from Stand Sit to Stand: Min assist         General transfer comment: bil UE support on therapist arms with left knee blocked and cues. pt able to stand x 2 from bed grossly 5-10 sec. Fatigues quickly  Ambulation/Gait             General Gait Details: not yet able  Stairs            Wheelchair Mobility    Modified Rankin (Stroke Patients Only)       Balance Overall balance assessment: Needs assistance Sitting-balance support: Feet supported;No upper extremity supported Sitting balance-Leahy Scale: Fair     Standing balance support: Bilateral upper extremity supported Standing balance-Leahy Scale: Poor                               Pertinent Vitals/Pain Pain Assessment: 0-10 Pain Score: 5  Pain Location: RLE, spasm Pain Descriptors / Indicators: Aching;Guarding;Spasm;Constant Pain Intervention(s): Limited activity within patient's tolerance;Monitored during session;Premedicated before session;Repositioned    Home Living Family/patient expects to be discharged to:: Private residence Living Arrangements: Spouse/significant other Available Help at Discharge: Family;Available 24 hours/day Type of Home: House Home Access: Level entry     Home Layout: Multi-level;Bed/bath upstairs Home  Equipment: Gilford Rile - 2 wheels;Cane - single point;Shower seat;Transport chair      Prior Function Level of Independence: Independent with assistive device(s);Needs assistance   Gait / Transfers Assistance Needed: uses RW for mobility the last few weeks, wife  ADL's / Homemaking Assistance Needed: wife has been assiting with bathing and dressing        Hand Dominance         Extremity/Trunk Assessment   Upper Extremity Assessment Upper Extremity Assessment: Overall WFL for tasks assessed    Lower Extremity Assessment Lower Extremity Assessment: RLE deficits/detail RLE Deficits / Details: AKA with spasms in hip flexors    Cervical / Trunk Assessment Cervical / Trunk Assessment:  (rounded shoulders, forward head)  Communication   Communication: HOH  Cognition Arousal/Alertness: Awake/alert Behavior During Therapy: WFL for tasks assessed/performed Overall Cognitive Status: Impaired/Different from baseline Area of Impairment: Following commands;Problem solving                       Following Commands: Follows one step commands with increased time     Problem Solving: Slow processing General Comments: per wife pt is very detail oriented and specific and tends to take a long time to accomplish tasks      General Comments      Exercises     Assessment/Plan    PT Assessment Patient needs continued PT services  PT Problem List Decreased range of motion;Decreased activity tolerance;Decreased knowledge of use of DME;Pain;Decreased cognition;Decreased skin integrity;Decreased mobility;Decreased safety awareness       PT Treatment Interventions DME instruction;Therapeutic exercise;Gait training;Functional mobility training;Therapeutic activities;Patient/family education;Cognitive remediation;Balance training    PT Goals (Current goals can be found in the Care Plan section)  Acute Rehab PT Goals Patient Stated Goal: be able to return home PT Goal Formulation: With patient/family Time For Goal Achievement: 08/26/19 Potential to Achieve Goals: Good    Frequency Min 3X/week   Barriers to discharge Decreased caregiver support      Co-evaluation               AM-PAC PT "6 Clicks" Mobility  Outcome Measure Help needed turning from your back to your side while in a flat bed without using bedrails?: A Little Help needed  moving from lying on your back to sitting on the side of a flat bed without using bedrails?: A Little Help needed moving to and from a bed to a chair (including a wheelchair)?: A Lot Help needed standing up from a chair using your arms (e.g., wheelchair or bedside chair)?: A Little Help needed to walk in hospital room?: Total Help needed climbing 3-5 steps with a railing? : Total 6 Click Score: 13    End of Session   Activity Tolerance: Patient tolerated treatment well Patient left: in bed;with call bell/phone within reach;with family/visitor present Nurse Communication: Mobility status PT Visit Diagnosis: Other abnormalities of gait and mobility (R26.89)    Time: 1016-1050 PT Time Calculation (min) (ACUTE ONLY): 34 min   Charges:   PT Evaluation $PT Eval Moderate Complexity: 1 Mod PT Treatments $Therapeutic Activity: 8-22 mins        Benson Porcaro P, PT Acute Rehabilitation Services Pager: [phone] Office: [place] 08/12/2019, 12:43 PM

## 2019-08-13 LAB — BASIC METABOLIC PANEL
Anion gap: 10 (ref 5–15)
BUN: 17 mg/dL (ref 8–23)
CO2: 26 mmol/L (ref 22–32)
Calcium: 8.3 mg/dL — ABNORMAL LOW (ref 8.9–10.3)
Chloride: 94 mmol/L — ABNORMAL LOW (ref 98–111)
Creatinine, Ser: 0.9 mg/dL (ref 0.61–1.24)
GFR calc Af Amer: >60 mL/min (ref >60)
GFR calc non Af Amer: >60 mL/min (ref >60)
Glucose, Bld: 84 mg/dL (ref 70–99)
Potassium: 5 mmol/L (ref 3.5–5.1)
Sodium: 130 mmol/L — ABNORMAL LOW (ref 135–145)

## 2019-08-13 LAB — CBC
HCT: 28.2 % — ABNORMAL LOW (ref 39.0–52.0)
Hemoglobin: 9.5 g/dL — ABNORMAL LOW (ref 13.0–17.0)
MCH: 32.3 pg (ref 26.0–34.0)
MCHC: 33.7 g/dL (ref 30.0–36.0)
MCV: 95.9 fL (ref 80.0–100.0)
Platelets: 261 10*3/uL (ref 150–400)
RBC: 2.94 MIL/uL — ABNORMAL LOW (ref 4.22–5.81)
RDW: 12.9 % (ref 11.5–15.5)
WBC: 10.9 10*3/uL — ABNORMAL HIGH (ref 4.0–10.5)
nRBC: 0 % (ref 0.0–0.2)

## 2019-08-13 LAB — TROPONIN I (HIGH SENSITIVITY)
Troponin I (High Sensitivity): 61 ng/L — ABNORMAL HIGH (ref <18)
Troponin I (High Sensitivity): 58 ng/L — ABNORMAL HIGH (ref <18)

## 2019-08-13 LAB — URINE CULTURE

## 2019-08-13 LAB — MAGNESIUM: Magnesium: 1.8 mg/dL (ref 1.7–2.4)

## 2019-08-13 LAB — SURGICAL PATHOLOGY

## 2019-08-13 MED ORDER — SODIUM CHLORIDE 0.9 % IV BOLUS
500.0000 mL | Freq: Once | INTRAVENOUS | Status: AC
  Administered 2019-08-13: 500 mL via INTRAVENOUS

## 2019-08-13 MED ORDER — POLYETHYLENE GLYCOL 3350 17 G PO PACK
17.0000 g | PACK | Freq: Every day | ORAL | Status: DC
  Administered 2019-08-13 – 2019-08-14 (×2): 17 g via ORAL
  Filled 2019-08-13 (×2): qty 1

## 2019-08-13 NOTE — Progress Notes (Signed)
BP 68/51 after nitro. Notified MD. Ordered 560ml Bolus. Will continue to monitor.  Era Bumpers, RN

## 2019-08-13 NOTE — Progress Notes (Signed)
Inpatient Rehab Admissions Coordinator:   Per Dr. Doristine Bosworth, hold admit to CIR today.  Will f/u tomorrow.   Shann Medal, PT, DPT Admissions Coordinator [phone] 08/13/19  2:32 PM

## 2019-08-13 NOTE — Progress Notes (Signed)
Patient reports chest pain and pressure. Notified MD. EKG completed. Nitroglycerin given. Will continue to monitor.  Era Bumpers, RN

## 2019-08-13 NOTE — Progress Notes (Signed)
Inpatient Rehab Admissions Coordinator:   Met with pt and wife at bedside.  Pt resting and wife provided info on AM chest pain and hypotension following administration of nitro.  Will f/u this afternoon; pt could potentially be ready for rehab admit if attending provider comfortable with that. I let the nurse know.   Shann Medal, PT, DPT Admissions Coordinator [phone] 08/13/19  12:27 PM

## 2019-08-13 NOTE — Progress Notes (Signed)
RT is on his home unit Trilogy machine. 2L O2 bleed in needed. Patient tolerating well.

## 2019-08-13 NOTE — Progress Notes (Signed)
Patient ID: [name], male   DOB: 1929-01-12, 86 y.o.   MRN: [id number]  PROGRESS NOTE    [name]  KVQ:[id number] DOB: 1929-07-01 DOA: 08/11/2019 PCP: [name], MD   Brief Narrative:  86 y.o. male with medical history significant of HTN, HLD, COPD, chronic respiratory failure on 2 L of oxygen, CAD s/p CABG, chronic diastolic CHF, PVD s/p right SFA occlusion, PAF, BPH, anxiety depression, hard of hearing, recent hospitalization from 06/27/2019-07/02/2019 for chronic nonhealing wound of the right foot and found to have multilevel arterial occlusive disease for which vascular surgery had said that there were no revascularization options presented with right leg pain.  Vascular surgery was consulted and patient underwent right AKA on 08/11/2019 by vascular surgery and subsequently admitted under hospitalist service.  Assessment & Plan:   Ischemic right foot secondary to peripheral vascular disease -Status post right AKA on 08/11/2019 vascular surgery.  Wound care as per vascular surgery recommendations. -he complains of uncontrolled pain around the stump site.  Continue oral narcotics.  He is also on Dilaudid.  Wife wants me to discontinue that as he has had some sort of reaction in the past.  Seen by PT OT.  Recommendation for discharging to CIR.  Chronic hypoxic respiratory failure COPD -Uses 2 L oxygen via nasal cannula at home.  Currently on the same.  Discontinue azithromycin.  Also on prednisone 10 mg daily at home. -Continue Dulera, Umeclidinium bromide and as needed nebs.  Leukocytosis -Monitor, improving.  CAD status post CABG -Currently stable.  Continue aspirin, statin, metoprolol  Chronic diastolic heart failure -Last echocardiogram on 9/120 showed normal LV function.  Continue strict input and output.  Daily weights.  Fluid restriction.  Continue metoprolol, Lasix and spironolactone.  Outpatient follow-up with cardiology  Essential hypertension -Blood pressure stable.   Continue antihypertensives as above  Abnormal urinalysis -Not spiking temperatures.  Probably is colonized.  Monitoring off antibiotics.  Chronic renal disease stage III -Baseline creatinine 1-1.1.  Monitor  History of right renal calculus BPH -Outpatient follow-up with urology.  Continue finasteride  Anxiety -Continue Xanax as needed  Generalized deconditioning -PT/OT eval.  Might need rehab placement  Chest pain: Patient complained of chest pain earlier today.  It was pressure-like according to him and 5 out of 10.  Patient's blood pressure was within normal range.  Patient was given 1 dose of nitroglycerin and EKG was done.  Patient's chest pain resolved after nitroglycerin.  EKG showed sinus rhythm with PACs.  Unfortunately, patient's blood pressure dropped in the 60s and he was feeling dizzy.  He was placed in reverse Trendelenburg position and was given 500 cc IV fluid bolus.  This helped improve his blood pressure.  Due to history of CAD, checking serial cardiac enzymes to rule out MI and holding is discharged to CIR today per his wife's request.  DVT prophylaxis: Heparin subcutaneous Code Status: DNR Family Communication: Wife at bedside Disposition Plan: Status is: Inpatient  Remains inpatient appropriate because:Inpatient level of care appropriate due to severity of illness   Dispo: The patient is from: Home              Anticipated d/c is to: CIR              Anticipated d/c date is: 1 day              Patient currently is not medically stable to d/c.   Consultants: Vascular surgery  Procedures: Right AKA on 08/11/2019  Antimicrobials: Perioperative   Subjective: Seen and examined earlier.  Wife at the bedside.  Patient told me that he had a very brief episode of chest pain an hour before I saw him but at the time I was seeing him, he had no chest pain but he was just not feeling well.  Also had pain at this time.  No other complaint.  Objective: Vitals:    08/13/19 0805 08/13/19 0818 08/13/19 0948 08/13/19 1100  BP:  124/63 127/65 112/76  Pulse:  100  90  Resp:  20  (!) 25  Temp:  98.1 F (36.7 C)  98.9 F (37.2 C)  TempSrc:  Oral  Oral  SpO2: 98% 99%  97%  Weight:      Height:        Intake/Output Summary (Last 24 hours) at 08/13/2019 1420 Last data filed at 08/13/2019 0654 Gross per 24 hour  Intake 0 ml  Output 1850 ml  Net -1850 ml   Filed Weights   08/11/19 0632 08/12/19 0618 08/13/19 0654  Weight: 58.1 kg 56.1 kg 60.1 kg    Examination:  General exam: Appears weak Respiratory system: Clear to auscultation. Respiratory effort normal. Cardiovascular system: S1 & S2 heard, RRR. No JVD, murmurs, rubs, gallops or clicks. No pedal edema. Gastrointestinal system: Abdomen is nondistended, soft and nontender. No organomegaly or masses felt. Normal bowel sounds heard. Central nervous system: Alert and oriented. No focal neurological deficits. Extremities: Right AKA Skin: No rashes, lesions or ulcers.  Psychiatry: Judgement and insight appear normal. Mood & affect appropriate.    Data Reviewed: I have personally reviewed following labs and imaging studies  CBC: Recent Labs  Lab 08/11/19 0715 08/12/19 0135 08/13/19 0317  WBC 12.9* 14.3* 10.9*  HGB 11.2* 10.2* 9.5*  HCT 36.0* 32.0* 28.2*  MCV 99.7 98.5 95.9  PLT 281 262 540   Basic Metabolic Panel: Recent Labs  Lab 08/11/19 0715 08/13/19 0317  NA 136 130*  K 4.1 5.0  CL 95* 94*  CO2 30 26  GLUCOSE 96 84  BUN 27* 17  CREATININE 1.20 0.90  CALCIUM 9.4 8.3*  MG  --  1.8   GFR: Estimated Creatinine Clearance: 48.2 mL/min (by C-G formula based on SCr of 0.9 mg/dL). Liver Function Tests: Recent Labs  Lab 08/11/19 0715  AST 35  ALT 43  ALKPHOS 77  BILITOT 0.8  PROT 6.0*  ALBUMIN 3.4*   No results for input(s): LIPASE, AMYLASE in the last 168 hours. No results for input(s): AMMONIA in the last 168 hours. Coagulation Profile: Recent Labs  Lab 08/11/19 0715   INR 1.0   Cardiac Enzymes: No results for input(s): CKTOTAL, CKMB, CKMBINDEX, TROPONINI in the last 168 hours. BNP (last 3 results) No results for input(s): PROBNP in the last 8760 hours. HbA1C: No results for input(s): HGBA1C in the last 72 hours. CBG: No results for input(s): GLUCAP in the last 168 hours. Lipid Profile: No results for input(s): CHOL, HDL, LDLCALC, TRIG, CHOLHDL, LDLDIRECT in the last 72 hours. Thyroid Function Tests: No results for input(s): TSH, T4TOTAL, FREET4, T3FREE, THYROIDAB in the last 72 hours. Anemia Panel: No results for input(s): VITAMINB12, FOLATE, FERRITIN, TIBC, IRON, RETICCTPCT in the last 72 hours. Sepsis Labs: No results for input(s): PROCALCITON, LATICACIDVEN in the last 168 hours.  Recent Results (from the past 240 hour(s))  SARS CORONAVIRUS 2 (TAT 6-24 HRS) Nasopharyngeal Nasopharyngeal Swab     Status: None   Collection Time: 08/08/19  3:31 PM  Specimen: Nasopharyngeal Swab  Result Value Ref Range Status   SARS Coronavirus 2 NEGATIVE NEGATIVE Final    Comment: (NOTE) SARS-CoV-2 target nucleic acids are NOT DETECTED.  The SARS-CoV-2 RNA is generally detectable in upper and lower respiratory specimens during the acute phase of infection. Negative results do not preclude SARS-CoV-2 infection, do not rule out co-infections with other pathogens, and should not be used as the sole basis for treatment or other patient management decisions. Negative results must be combined with clinical observations, patient history, and epidemiological information. The expected result is Negative.  Fact Sheet for Patients: SugarRoll.be  Fact Sheet for Healthcare Providers: https://www.woods-mathews.com/  This test is not yet approved or cleared by the [place] FDA and  has been authorized for detection and/or diagnosis of SARS-CoV-2 by FDA under an Emergency Use Authorization (EUA). This EUA will remain  in  effect (meaning this test can be used) for the duration of the COVID-19 declaration under Se ction 564(b)(1) of the Act, 21 U.S.C. section 360bbb-3(b)(1), unless the authorization is terminated or revoked sooner.  Performed at [facility], [place] [address], [place], [place] [address]   MRSA PCR Screening     Status: None   Collection Time: 08/11/19  7:33 PM   Specimen: Nasal Mucosa; Nasopharyngeal  Result Value Ref Range Status   MRSA by PCR NEGATIVE NEGATIVE Final    Comment:        The GeneXpert MRSA Assay (FDA approved for NASAL specimens only), is one component of a comprehensive MRSA colonization surveillance program. It is not intended to diagnose MRSA infection nor to guide or monitor treatment for MRSA infections. Performed at [facility], [place] [address] [place], [place] [address]   Culture, Urine     Status: Abnormal   Collection Time: 08/11/19  8:15 PM   Specimen: Urine, Catheterized  Result Value Ref Range Status   Specimen Description URINE, CATHETERIZED  Final   Special Requests   Final    NONE Performed at [facility], [address], [place], [place] [address]    Culture MULTIPLE SPECIES PRESENT, SUGGEST RECOLLECTION (A)  Final   Report Status 08/13/2019 FINAL  Final         Radiology Studies: No results found.      Scheduled Meds: . aspirin EC  81 mg Oral Daily  . atorvastatin  20 mg Oral QHS  . azithromycin  250 mg Oral Daily  . budesonide-formoterol  2 puff Inhalation BID  . Chlorhexidine Gluconate Cloth  6 each Topical q morning - 10a  . cycloSPORINE  1 drop Both Eyes BID  . docusate sodium  100 mg Oral Daily  . famotidine  20 mg Oral QHS  . finasteride  5 mg Oral Q lunch  . fluticasone  2 spray Each Nare Daily  . furosemide  20 mg Oral BID  . heparin  5,000 Units Subcutaneous Q8H  . metoprolol succinate  12.5 mg Oral Daily  . polyethylene glycol  17 g Oral Daily  . predniSONE  10 mg Oral q AM  . sodium chloride   1 drop Both Eyes QID  . spironolactone  12.5 mg Oral QODAY  . spironolactone  25 mg Oral QODAY  . umeclidinium bromide  1 puff Inhalation Daily   Continuous Infusions: . magnesium sulfate bolus IVPB      Darliss Cheney, MD [facility] 08/13/2019, 2:20 PM

## 2019-08-13 NOTE — Progress Notes (Signed)
Occupational Therapy Treatment Patient Details Name: [name] MRN: [id number] DOB: 1929/11/11 Today's Date: 08/13/2019    History of present illness 86 yo admitted with RLE pain with arterial occlusion without option for revascularization s/p Rt AKA. PMHx: COPD on home 2L, CAD s/p CABG, CHF, PVD, PAF, HTN, HLD, BPH, depression/anxiety, HOH   OT comments  Pt very fatigued this PM and requiring some encouragement to participate in therapy session (pt with chest pain earlier today and with episode of hypotension). Pt engaged in bed level activity, completing multiple ADL tasks with setup assist. Pt requiring increased time and seated rest breaks throughout all activity. Pt able to progress to long sitting in bed with minA and use of bil bedrail. He does endorse feeling better once positioned in upright position. SpO2 >92%, BP start of session 112/62 (78), end of session: 110/58 (74). Continue to recommend CIR level therapies at time of discharge. Will follow while acutely admitted.   Follow Up Recommendations  CIR    Equipment Recommendations  Wheelchair (measurements OT);Wheelchair cushion (measurements OT);Hospital bed    Recommendations for Other Services Rehab consult    Precautions / Restrictions Precautions Precautions: Fall Precaution Comments: Rt AKA Restrictions Weight Bearing Restrictions: Yes       Mobility Bed Mobility Overal bed mobility: Needs Assistance             General bed mobility comments: minA for pulling trunk forwards away from [facility] while HOB elevated. completed x2  Transfers                 General transfer comment: declined will attempt next session    Balance                                           ADL either performed or assessed with clinical judgement   ADL Overall ADL's : Needs assistance/impaired     Grooming: Wash/dry face;Set up;Bed level;Brushing hair;Supervision/safety Grooming Details (indicate cue  type and reason): pt requiring rest breaks throughout activity                                General ADL Comments: pt reports having had a rough day (with chest pain earlier today and then hypotensive after receiving meds), requiring some encouragement to participate. pt notably fatigued and requiring rest breaks throughout                        Cognition Arousal/Alertness: Lethargic;Suspect due to medications Behavior During Therapy: [facility] for tasks assessed/performed Overall Cognitive Status: Within Functional Limits for tasks assessed                                          Exercises Exercises: General Upper Extremity General Exercises - Upper Extremity Shoulder Flexion: AROM;Both;10 reps Shoulder Horizontal ABduction: AROM;Both;10 reps Shoulder Horizontal ADduction: AROM;Both;10 reps Elbow Flexion: AROM;Both;5 reps Elbow Extension: AROM;Both;5 reps   Shoulder Instructions       General Comments      Pertinent Vitals/ Pain       Pain Assessment: Faces Faces Pain Scale: Hurts little more Pain Location: generalized  Pain Descriptors / Indicators: Discomfort Pain Intervention(s): Monitored during session;Repositioned  Home Living  Prior Functioning/Environment              Frequency  Min 3X/week        Progress Toward Goals  OT Goals(current goals can now be found in the care plan section)  Progress towards OT goals: Progressing toward goals  Acute Rehab OT Goals Patient Stated Goal: be able to return home OT Goal Formulation: With patient/family Time For Goal Achievement: 08/26/19 Potential to Achieve Goals: Good ADL Goals Pt Will Perform Upper Body Dressing: with set-up;bed level Pt Will Transfer to Toilet: with mod assist;with transfer board;bedside commode Pt/caregiver will Perform Home Exercise Program: With Supervision;With written HEP  provided Additional ADL Goal #1: pt will complete bed mobility supervision as precursor to adls Additional ADL Goal #2: pt will demonstrate side lying hip extension exercises supervision level x15 reps  Plan Discharge plan remains appropriate    Co-evaluation                 AM-PAC OT "6 Clicks" Daily Activity     Outcome Measure   Help from another person eating meals?: A Little Help from another person taking care of personal grooming?: A Little Help from another person toileting, which includes using toliet, bedpan, or urinal?: A Lot Help from another person bathing (including washing, rinsing, drying)?: A Lot Help from another person to put on and taking off regular upper body clothing?: A Little Help from another person to put on and taking off regular lower body clothing?: A Lot 6 Click Score: 15    End of Session Equipment Utilized During Treatment: Oxygen  OT Visit Diagnosis: Unsteadiness on feet (R26.81);Muscle weakness (generalized) (M62.81)   Activity Tolerance Patient limited by fatigue   Patient Left in bed;with call bell/phone within reach   Nurse Communication Mobility status;Precautions;Weight bearing status        Time: 5784-6962 OT Time Calculation (min): 23 min  Charges: OT General Charges $OT Visit: 1 Visit OT Treatments $Self Care/Home Management : 8-22 mins  Lou Cal, OT Acute Rehabilitation Services Pager [phone] [place] 08/13/2019, 5:14 PM

## 2019-08-13 NOTE — Progress Notes (Signed)
Mobility Specialist: Progress Note    08/13/19 1748  Mobility  Activity Dangled on edge of bed  Level of Assistance Moderate assist, patient does 50-74%  Assistive Device None  Mobility Response Tolerated fair  Mobility performed by Mobility specialist  Bed Position High-fowlers  $Mobility charge 1 Mobility   Pre-Mobility: 95 HR, 138/78 BP, 100% SpO2 Post-Mobility: 96 HR, 131/75 BP, 100% SpO2  Pt dangled on EOB for 3 minutes. Pt c/o feeling light headed.   [facility] [name] Mobility Specialist

## 2019-08-13 NOTE — Progress Notes (Addendum)
  Progress Note    08/13/2019 7:46 AM 2 Days Post-Op  Subjective:  No complaints   Vitals:   08/13/19 0451 08/13/19 0654  BP: 140/80   Pulse: 100 95  Resp: 12 14  Temp: 98.3 F (36.8 C)   SpO2: 96% 98%    Physical Exam: Incisions:  Bandage is clean (just changed by Dr. Scot Dock).   CBC    Component Value Date/Time   WBC 10.9 (H) 08/13/2019 0317   RBC 2.94 (L) 08/13/2019 0317   HGB 9.5 (L) 08/13/2019 0317   HGB 12.4 (L) 06/25/2019 1449   HCT 28.2 (L) 08/13/2019 0317   HCT 36.6 (L) 06/25/2019 1449   PLT 261 08/13/2019 0317   PLT 208 06/25/2019 1449   MCV 95.9 08/13/2019 0317   MCV 96 06/25/2019 1449   MCH 32.3 08/13/2019 0317   MCHC 33.7 08/13/2019 0317   RDW 12.9 08/13/2019 0317   RDW 12.2 06/25/2019 1449   LYMPHSABS 0.5 (L) 07/02/2019 0438   LYMPHSABS 0.4 (L) 06/25/2019 1449   MONOABS 0.8 07/02/2019 0438   EOSABS 0.1 07/02/2019 0438   EOSABS 0.0 06/25/2019 1449   BASOSABS 0.0 07/02/2019 0438   BASOSABS 0.1 06/25/2019 1449    BMET    Component Value Date/Time   NA 130 (L) 08/13/2019 0317   NA 139 06/25/2019 1449   K 5.0 08/13/2019 0317   CL 94 (L) 08/13/2019 0317   CO2 26 08/13/2019 0317   GLUCOSE 84 08/13/2019 0317   BUN 17 08/13/2019 0317   BUN 29 (H) 06/25/2019 1449   CREATININE 0.90 08/13/2019 0317   CALCIUM 8.3 (L) 08/13/2019 0317   GFRNONAA >60 08/13/2019 0317   GFRAA >60 08/13/2019 0317    INR    Component Value Date/Time   INR 1.0 08/11/2019 0715     Intake/Output Summary (Last 24 hours) at 08/13/2019 0746 Last data filed at 08/13/2019 0654 Gross per 24 hour  Intake 0 ml  Output 2650 ml  Net -2650 ml     Assessment/Plan:  86 y.o. male is s/p right above knee amputation  2 Days Post-Op  -dressing changed earlier by Dr. Scot Dock & is clean and dry.  -will continue to follow    Leontine Locket, PA-C [facility] [phone] 08/13/2019 7:46 AM  I have interviewed the patient and examined the patient. I agree  with the findings by the PA.  I looked at his incision this morning.  So far it is healing well with some mild bruising on the posterior central portion of the AKA.  His pain seems to be tolerable.  Appreciate [facility] help. I have ordered daily dressing changes.  Gae Gallop, MD [phone]

## 2019-08-13 NOTE — PMR Pre-admission (Signed)
PMR Admission Coordinator Pre-Admission Assessment  Patient: [name] is an 86 y.o., male MRN: [id number] DOB: 1929-09-12 Height: _0  (175.3 cm) Weight: 60.1 kg  Insurance Information HMO:     PPO:      PCP:      IPA:      80/20:      OTHER:  PRIMARY: Medicare A and B      Policy#: [id number]      Subscriber: pt CM Name:       Phone#:      Fax#:  Pre-Cert#: verified Civil engineer, contracting:  Benefits:  Phone #:      Name:  Eff. Date: A and B 07/09/96     Deduct: $1484      Out of Pocket Max: n/a      Life Max: n/a CIR: 100%      SNF: 20 full days Outpatient: 80%     Co-Pay: 20% Home Health: 100%      Co-Pay:  DME: 80%     Co-Pay: 20% Providers: pt choice   SECONDARY: [employer]      Policy#: [id number]     Phone#: [phone]  Financial Counselor:       Phone#:   The "Data Collection Information Summary" for patients in Inpatient Rehabilitation Facilities with attached "Privacy Act [place] Records" was provided and verbally reviewed with: Patient and Family  Emergency Contact Information Contact Information    Name Relation Home Work Mobile   [place] Spouse   [phone]   Seabury,Eric Pandora Leiter   [phone]   Adams,Alyssa Daughter   [phone]      Current Medical History  Patient Admitting Diagnosis: R AKA  History of Present Illness: Pt is an 86 y/o male with PMH of HTN, HLD, COPD (on 2L home O2), CAD s/p CABG, CDCHF, PVD, PAF, BPH, anxiety/depression, admitted to [facility] on 08/11/19 for R AKA per Dr. Scot Dock.  Post op course pain management.  Pt with bouts of chest pain on 8/3, treated with nitro, which caused hypotension.  Gentle hydration with IVF.      Patient's medical record from [facility] has been reviewed by the rehabilitation admission coordinator and physician.  Past Medical History  Past Medical History:  Diagnosis Date  . Anxiety   . Arthritis    "generalized" (04/04/2017)  . Asthma   . Basal cell carcinoma    "left ear"  . BPH  (benign prostatic hyperplasia)    severe; s/p multiple biopsies  . CAD (coronary artery disease)   . Carotid artery occlusion   . Chronic respiratory failure ([place])   . Chronic rhinitis   . COPD (chronic obstructive pulmonary disease) (HCC)    2L [place] O2  . Diastolic heart failure ([place]) 2019  . Dilation of biliary tract   . Elevated troponin 03/09/2017  . Gallstones   . GERD (gastroesophageal reflux disease)   . History of blood transfusion    "w/his CABG" (04/04/2017)  . History of kidney stones   . Hyperlipidemia   . Hypertension   . On home oxygen therapy    "~ 24/7" (04/04/2017)  . Peripheral vascular disease ([place])   . Pneumonia 2019  . Squamous cell carcinoma of skin 04/23/2013   in situ-Right flank (txpbx)  . Squamous cell carcinoma of skin 01/23/2017   [place]/ in siut- Left ear rim (CX3FU)  . Syncope and collapse   . Ureteral tumor 08/2015   had endoscopic procedure for evaluation, unable  to reach for biopsy  . Urinary catheter (Foley) change required     Family History   family history includes Allergies in his brother; Breast cancer in his mother; Cancer in his mother; Heart attack in his father; Heart disease in his brother, brother, father, and mother; Hyperlipidemia in his brother; Hypertension in his brother and mother; Other in his mother; Peripheral vascular disease in his brother; Stroke in his brother.  Prior Rehab/Hospitalizations Has the patient had prior rehab or hospitalizations prior to admission? Yes  Has the patient had major surgery during 100 days prior to admission? Yes   Current Medications  Current Facility-Administered Medications:  .  acetaminophen (TYLENOL) tablet 325-650 mg, 325-650 mg, Oral, Q4H PRN, 650 mg at 08/13/19 0829 **OR** acetaminophen (TYLENOL) suppository 325-650 mg, 325-650 mg, Rectal, Q4H PRN, Baglia, Corrina, PA-C .  ALPRAZolam Prudy Feeler) tablet 0.125-0.25 mg, 0.125-0.25 mg, Oral, TID PRN, Baglia, Corrina, PA-C, 0.25 mg at 08/12/19  2232 .  alum & mag hydroxide-simeth (MAALOX/MYLANTA) 200-200-20 MG/5ML suspension 15-30 mL, 15-30 mL, Oral, Q2H PRN, Baglia, Corrina, PA-C .  aspirin EC tablet 81 mg, 81 mg, Oral, Daily, Baglia, Corrina, PA-C, 81 mg at 08/13/19 1216 .  atorvastatin (LIPITOR) tablet 20 mg, 20 mg, Oral, QHS, Baglia, Corrina, PA-C, 20 mg at 08/12/19 2232 .  azithromycin (ZITHROMAX) tablet 250 mg, 250 mg, Oral, Daily, Baglia, Corrina, PA-C, 250 mg at 08/13/19 0823 .  budesonide-formoterol (SYMBICORT) 160-4.5 MCG/ACT inhaler 2 puff, 2 puff, Inhalation, BID, Madelyn Flavors A, MD, 2 puff at 08/13/19 0802 .  Chlorhexidine Gluconate Cloth 2 % PADS 6 each, 6 each, Topical, q morning - 10a, Clydie Braun, MD, 6 each at 08/13/19 0825 .  cycloSPORINE (RESTASIS) 0.05 % ophthalmic emulsion 1 drop, 1 drop, Both Eyes, BID, Smith, Rondell A, MD, 1 drop at 08/13/19 0823 .  docusate sodium (COLACE) capsule 100 mg, 100 mg, Oral, Daily, Baglia, Corrina, PA-C, 100 mg at 08/13/19 0821 .  famotidine (PEPCID) tablet 20 mg, 20 mg, Oral, QHS, Baglia, Corrina, PA-C, 20 mg at 08/12/19 2232 .  finasteride (PROSCAR) tablet 5 mg, 5 mg, Oral, Q lunch, Baglia, Corrina, PA-C, 5 mg at 08/12/19 1233 .  fluticasone (FLONASE) 50 MCG/ACT nasal spray 2 spray, 2 spray, Each Nare, Daily, Baglia, Corrina, PA-C, 2 spray at 08/12/19 0819 .  furosemide (LASIX) tablet 20 mg, 20 mg, Oral, BID, Baglia, Corrina, PA-C, 20 mg at 08/13/19 2446 .  guaiFENesin-dextromethorphan (ROBITUSSIN DM) 100-10 MG/5ML syrup 15 mL, 15 mL, Oral, Q4H PRN, Baglia, Corrina, PA-C .  heparin injection 5,000 Units, 5,000 Units, Subcutaneous, Q8H, Baglia, Corrina, PA-C, 5,000 Units at 08/13/19 0629 .  hydrALAZINE (APRESOLINE) injection 5 mg, 5 mg, Intravenous, Q20 Min PRN, Baglia, Corrina, PA-C .  HYDROcodone-acetaminophen (NORCO/VICODIN) 5-325 MG per tablet 1-2 tablet, 1-2 tablet, Oral, Q4H PRN, Hanley Ben, Kshitiz, MD .  labetalol (NORMODYNE) injection 10 mg, 10 mg, Intravenous, Q10 min PRN,  Baglia, Corrina, PA-C .  levalbuterol (XOPENEX) nebulizer solution 0.63 mg, 0.63 mg, Nebulization, Q6H PRN, Smith, Rondell A, MD .  magnesium sulfate IVPB 2 g 50 mL, 2 g, Intravenous, Daily PRN, Baglia, Corrina, PA-C .  metoprolol succinate (TOPROL-XL) 24 hr tablet 12.5 mg, 12.5 mg, Oral, Daily, Baglia, Corrina, PA-C, 12.5 mg at 08/13/19 0821 .  metoprolol tartrate (LOPRESSOR) injection 2-5 mg, 2-5 mg, Intravenous, Q2H PRN, Baglia, Corrina, PA-C .  nitroGLYCERIN (NITROSTAT) SL tablet 0.4 mg, 0.4 mg, Sublingual, Q5 min PRN, Baglia, Corrina, PA-C, 0.4 mg at 08/13/19 0947 .  ondansetron (ZOFRAN) injection 4  mg, 4 mg, Intravenous, Q6H PRN, Baglia, Corrina, PA-C .  pantoprazole (PROTONIX) EC tablet 40 mg, 40 mg, Oral, Daily, Baglia, Corrina, PA-C, 40 mg at 08/13/19 0821 .  phenol (CHLORASEPTIC) mouth spray 1 spray, 1 spray, Mouth/Throat, PRN, Baglia, Corrina, PA-C .  polyethylene glycol (MIRALAX / GLYCOLAX) packet 17 g, 17 g, Oral, Daily, Pahwani, Ravi, MD .  potassium chloride SA (KLOR-CON) CR tablet 20-40 mEq, 20-40 mEq, Oral, Daily PRN, Baglia, Corrina, PA-C .  predniSONE (DELTASONE) tablet 10 mg, 10 mg, Oral, q AM, Baglia, Corrina, PA-C, 10 mg at 08/12/19 5997 .  senna-docusate (Senokot-S) tablet 1 tablet, 1 tablet, Oral, QHS PRN, Baglia, Corrina, PA-C .  sodium chloride (MURO 128) 5 % ophthalmic solution 1 drop, 1 drop, Both Eyes, QID, Smith, Rondell A, MD, 1 drop at 08/13/19 0824 .  spironolactone (ALDACTONE) tablet 12.5 mg, 12.5 mg, Oral, QODAY, Smith, Rondell A, MD, 12.5 mg at 08/13/19 7414 .  spironolactone (ALDACTONE) tablet 25 mg, 25 mg, Oral, QODAY, Smith, Rondell A, MD .  umeclidinium bromide (INCRUSE ELLIPTA) 62.5 MCG/INH 1 puff, 1 puff, Inhalation, Daily, Fuller Plan A, MD, 1 puff at 08/12/19 0847  Patients Current Diet:  Diet Order            Diet Heart Room service appropriate? Yes; Fluid consistency: Thin; Fluid restriction: 1500 mL Fluid  Diet effective now                  Precautions / Restrictions Precautions Precautions: Fall Precaution Comments: Rt AKA Restrictions Weight Bearing Restrictions: Yes RLE Weight Bearing: Touchdown weight bearing   Has the patient had 2 or more falls or a fall with injury in the past year? Yes  Prior Activity Level Limited Community (1-2x/wk): Has been having more difficulty with mobility and ADLs 3 months PTA, using a cane or RW for ambulation.  Prior Functional Level Self Care: Did the patient need help bathing, dressing, using the toilet or eating? Needed some help  Indoor Mobility: Did the patient need assistance with walking from room to room (with or without device)? Independent  Stairs: Did the patient need assistance with internal or external stairs (with or without device)? Needed some help  Functional Cognition: Did the patient need help planning regular tasks such as shopping or remembering to take medications? Independent  Home Assistive Devices / Equipment Home Assistive Devices/Equipment: Other (Comment) (oxygen) Home Equipment: Walker - 2 wheels, Cane - single point, [employer], Transport chair  Prior Device Use: Indicate devices/aids used by the patient prior to current illness, exacerbation or injury? Walker and cane  Current Functional Level Cognition  Overall Cognitive Status: Within Functional Limits for tasks assessed Orientation Level: Oriented X4 Following Commands: Follows one step commands with increased time General Comments: per wife pt is very detail oriented and specific and tends to take a long time to accomplish tasks    Extremity Assessment (includes Sensation/Coordination)  Upper Extremity Assessment: Generalized weakness  Lower Extremity Assessment: Defer to PT evaluation RLE Deficits / Details: AKA with spasms in hip flexors    ADLs  Overall ADL's : Needs assistance/impaired Eating/Feeding: Minimal assistance, Bed level Eating/Feeding Details (indicate cue type and  reason): eating lunch on arrival. pt with setup by wife for self feeding.  Grooming: Wash/dry face, Set up, Bed level General ADL Comments: pt eating lunch and education on OT services and OT to return to complete session. RN pre medicated patient as requested. pt was feeling fatigued but agreeable to some bed level  task. Pt educated on residual limb tactile input to help with phatom pains. Pt demonstrates hip abduction/ adduction exercise. L LE elevated with pillows activation toes ankle and knee this session in attempt to help with edema management. Pt and wife educated to have patient rotate on his side in the next 2 hours to give pressure relief to buttock.      Mobility  Overal bed mobility: Needs Assistance Bed Mobility: Rolling Rolling: Min assist Supine to sit: HOB elevated, Min assist General bed mobility comments: declines oob or eob at this time due to fatigue. pt reports i really am just so tired from the day.     Transfers  Overall transfer level: Needs assistance Transfers: Sit to/from Stand Sit to Stand: Min assist General transfer comment: declined will attempt next session    Ambulation / Gait / Stairs / Wheelchair Mobility  Ambulation/Gait General Gait Details: not yet able    Posture / Balance Balance Overall balance assessment: Needs assistance Sitting-balance support: Feet supported, No upper extremity supported Sitting balance-Leahy Scale: Fair Standing balance support: Bilateral upper extremity supported Standing balance-Leahy Scale: Poor    Special needs/care consideration BiPAP, Special service needs hard of hearing and Designated visitor spouse [place] (from acute therapy documentation) Living Arrangements: Spouse/significant other Available Help at Discharge: Family, Available 24 hours/day Type of Home: House Home Layout: Multi-level, Bed/bath upstairs Alternate Level Stairs-Number of Steps: 9 to kitchen/ living, 6  additional to bedroom Home Access: Level entry Bathroom Shower/Tub: Multimedia programmer: [place]: Yes Type of Home Care Services: Home OT, [place] (if known): Advanced  Discharge Living Setting Plans for Discharge Living Setting: Patient's home Type of Home at Discharge: House Discharge Home Layout: Multi-level, Able to live on main level with bedroom/bathroom (split level) Alternate Level Stairs-Number of Steps: 9 to kitchen/living, +6 to bedroom Discharge Home Access: Stairs to enter, Level entry Entrance Stairs-Rails: None Entrance Stairs-Number of Steps: 5 Discharge Bathroom Shower/Tub: Walk-in shower Discharge Bathroom Toilet: Standard Discharge Bathroom Accessibility: Yes How Accessible: Accessible via walker Does the patient have any problems obtaining your medications?: No  Social/Family/Support Systems Patient Roles: Spouse Anticipated Caregiver: wife, [name], and children Randall Hiss and Chumuckla) Anticipated Caregiver's Contact Information: [name] [phone] Ability/Limitations of Caregiver: min assist Caregiver Availability: 24/7 Discharge Plan Discussed with Primary Caregiver: Yes Is Caregiver In Agreement with Plan?: Yes Does Caregiver/Family have Issues with Lodging/Transportation while Pt is in Rehab?: No  Goals Patient/Family Goal for Rehab: PT/OT supervision with [facility] (short distance amb, w/c longer distance) Expected length of stay: 15-19 days Additional Information: Pt has a level entry to lowest floor of house where they can set up a bed, and there is a bathroom.  Also have discussed possibility of stair lift in the house for longer term access Pt/Family Agrees to Admission and willing to participate: Yes Program Orientation Provided & Reviewed with Pt/Caregiver Including Roles  & Responsibilities: Yes  Decrease burden of Care through IP rehab admission:  n/a  Possible need for SNF placement upon discharge: Not anticipated  Patient Condition: I have reviewed medical records from [facility], spoken with CM, and patient and spouse. I met with patient at the bedside for inpatient rehabilitation assessment.  Patient will benefit from ongoing PT and OT, can actively participate in 3 hours of therapy a day 5 days of the week, and can make measurable gains during the admission.  Patient will also benefit  from the coordinated team approach during an Inpatient Acute Rehabilitation admission.  The patient will receive intensive therapy as well as Rehabilitation physician, nursing, social worker, and care management interventions.  Due to bladder management, bowel management, safety, skin/wound care, disease management, medication administration, pain management and patient education the patient requires 24 hour a day rehabilitation nursing.  The patient is currently min assist with mobility and basic ADLs.  Discharge setting and therapy post discharge at home with home health is anticipated.  Patient has agreed to participate in the Acute Inpatient Rehabilitation Program and will admit today.  Preadmission Screen Completed By:  Michel Santee, PT, DPT 08/13/2019 11:18 AM ______________________________________________________________________   Discussed status with Dr. Ranell Patrick on 08/13/19  at 11:24 AM  and received approval for admission today.  Admission Coordinator:  Michel Santee, PT, DPT time 11:24 AM Sudie Grumbling 08/13/19    Assessment/Plan: Diagnosis: Right AKA 1. Does the need for close, 24 hr/day Medical supervision in concert with the patient's rehab needs make it unreasonable for this patient to be served in a less intensive setting? Yes 2. Co-Morbidities requiring supervision/potential complications: Severe PAD, HLD, BPH, Anxiety, chronic respiratory failure with hypoxia and hypercapnia 3. Due to bladder management, bowel management, safety,  skin/wound care, disease management, medication administration, pain management and patient education, does the patient require 24 hr/day rehab nursing? Yes 4. Does the patient require coordinated care of a physician, rehab nurse, PT, OT to address physical and functional deficits in the context of the above medical diagnosis(es)? Yes Addressing deficits in the following areas: balance, endurance, locomotion, strength, transferring, bowel/bladder control, bathing, dressing, feeding, grooming, toileting and psychosocial support 5. Can the patient actively participate in an intensive therapy program of at least 3 hrs of therapy 5 days a week? Yes 6. The potential for patient to make measurable gains while on inpatient rehab is good 7. Anticipated functional outcomes upon discharge from inpatient rehab: min assist PT, min assist OT, independent SLP 8. Estimated rehab length of stay to reach the above functional goals is: 20-22 days 9. Anticipated discharge destination: Home 10. Overall Rehab/Functional Prognosis: good   MD Signature: Leeroy Cha, MD

## 2019-08-14 ENCOUNTER — Other Ambulatory Visit: Payer: Self-pay

## 2019-08-14 ENCOUNTER — Encounter (HOSPITAL_COMMUNITY): Payer: Self-pay | Admitting: Physical Medicine & Rehabilitation

## 2019-08-14 ENCOUNTER — Inpatient Hospital Stay (HOSPITAL_COMMUNITY)
Admission: RE | Admit: 2019-08-14 | Discharge: 2019-08-20 | DRG: 560 | Disposition: A | Discharge status: Skilled Nursing Facility | Payer: Medicare Other | Source: Intra-hospital | Attending: Physical Medicine & Rehabilitation | Admitting: Physical Medicine & Rehabilitation

## 2019-08-14 DIAGNOSIS — R5383 Other fatigue: Secondary | ICD-10-CM | POA: Diagnosis present

## 2019-08-14 DIAGNOSIS — R339 Retention of urine, unspecified: Secondary | ICD-10-CM | POA: Diagnosis not present

## 2019-08-14 DIAGNOSIS — J9611 Chronic respiratory failure with hypoxia: Secondary | ICD-10-CM | POA: Diagnosis present

## 2019-08-14 DIAGNOSIS — Z7982 Long term (current) use of aspirin: Secondary | ICD-10-CM

## 2019-08-14 DIAGNOSIS — G546 Phantom limb syndrome with pain: Secondary | ICD-10-CM | POA: Diagnosis present

## 2019-08-14 DIAGNOSIS — J329 Chronic sinusitis, unspecified: Secondary | ICD-10-CM | POA: Diagnosis present

## 2019-08-14 DIAGNOSIS — I5022 Chronic systolic (congestive) heart failure: Secondary | ICD-10-CM

## 2019-08-14 DIAGNOSIS — E222 Syndrome of inappropriate secretion of antidiuretic hormone: Secondary | ICD-10-CM | POA: Diagnosis not present

## 2019-08-14 DIAGNOSIS — Z7951 Long term (current) use of inhaled steroids: Secondary | ICD-10-CM

## 2019-08-14 DIAGNOSIS — Z89611 Acquired absence of right leg above knee: Secondary | ICD-10-CM | POA: Diagnosis not present

## 2019-08-14 DIAGNOSIS — J432 Centrilobular emphysema: Secondary | ICD-10-CM | POA: Diagnosis not present

## 2019-08-14 DIAGNOSIS — I110 Hypertensive heart disease with heart failure: Secondary | ICD-10-CM | POA: Diagnosis not present

## 2019-08-14 DIAGNOSIS — L89159 Pressure ulcer of sacral region, unspecified stage: Secondary | ICD-10-CM | POA: Diagnosis not present

## 2019-08-14 DIAGNOSIS — D62 Acute posthemorrhagic anemia: Secondary | ICD-10-CM | POA: Diagnosis present

## 2019-08-14 DIAGNOSIS — Z9181 History of falling: Secondary | ICD-10-CM | POA: Diagnosis not present

## 2019-08-14 DIAGNOSIS — Z79899 Other long term (current) drug therapy: Secondary | ICD-10-CM

## 2019-08-14 DIAGNOSIS — I130 Hypertensive heart and chronic kidney disease with heart failure and stage 1 through stage 4 chronic kidney disease, or unspecified chronic kidney disease: Secondary | ICD-10-CM | POA: Diagnosis present

## 2019-08-14 DIAGNOSIS — K219 Gastro-esophageal reflux disease without esophagitis: Secondary | ICD-10-CM | POA: Diagnosis present

## 2019-08-14 DIAGNOSIS — R338 Other retention of urine: Secondary | ICD-10-CM | POA: Diagnosis present

## 2019-08-14 DIAGNOSIS — J441 Chronic obstructive pulmonary disease with (acute) exacerbation: Secondary | ICD-10-CM | POA: Diagnosis present

## 2019-08-14 DIAGNOSIS — R1312 Dysphagia, oropharyngeal phase: Secondary | ICD-10-CM | POA: Diagnosis not present

## 2019-08-14 DIAGNOSIS — R7401 Elevation of levels of liver transaminase levels: Secondary | ICD-10-CM | POA: Diagnosis not present

## 2019-08-14 DIAGNOSIS — Z79890 Hormone replacement therapy (postmenopausal): Secondary | ICD-10-CM

## 2019-08-14 DIAGNOSIS — J9612 Chronic respiratory failure with hypercapnia: Secondary | ICD-10-CM | POA: Diagnosis present

## 2019-08-14 DIAGNOSIS — I739 Peripheral vascular disease, unspecified: Secondary | ICD-10-CM | POA: Diagnosis not present

## 2019-08-14 DIAGNOSIS — L89622 Pressure ulcer of left heel, stage 2: Secondary | ICD-10-CM | POA: Diagnosis not present

## 2019-08-14 DIAGNOSIS — E785 Hyperlipidemia, unspecified: Secondary | ICD-10-CM | POA: Diagnosis present

## 2019-08-14 DIAGNOSIS — J449 Chronic obstructive pulmonary disease, unspecified: Secondary | ICD-10-CM | POA: Diagnosis present

## 2019-08-14 DIAGNOSIS — R0989 Other specified symptoms and signs involving the circulatory and respiratory systems: Secondary | ICD-10-CM | POA: Diagnosis not present

## 2019-08-14 DIAGNOSIS — Z7952 Long term (current) use of systemic steroids: Secondary | ICD-10-CM

## 2019-08-14 DIAGNOSIS — I2510 Atherosclerotic heart disease of native coronary artery without angina pectoris: Secondary | ICD-10-CM | POA: Diagnosis present

## 2019-08-14 DIAGNOSIS — H9190 Unspecified hearing loss, unspecified ear: Secondary | ICD-10-CM | POA: Diagnosis present

## 2019-08-14 DIAGNOSIS — R52 Pain, unspecified: Secondary | ICD-10-CM

## 2019-08-14 DIAGNOSIS — E46 Unspecified protein-calorie malnutrition: Secondary | ICD-10-CM | POA: Diagnosis present

## 2019-08-14 DIAGNOSIS — E871 Hypo-osmolality and hyponatremia: Secondary | ICD-10-CM | POA: Diagnosis not present

## 2019-08-14 DIAGNOSIS — Z85828 Personal history of other malignant neoplasm of skin: Secondary | ICD-10-CM | POA: Diagnosis not present

## 2019-08-14 DIAGNOSIS — N183 Chronic kidney disease, stage 3 unspecified: Secondary | ICD-10-CM | POA: Diagnosis present

## 2019-08-14 DIAGNOSIS — I5030 Unspecified diastolic (congestive) heart failure: Secondary | ICD-10-CM | POA: Diagnosis not present

## 2019-08-14 DIAGNOSIS — G8918 Other acute postprocedural pain: Secondary | ICD-10-CM | POA: Diagnosis not present

## 2019-08-14 DIAGNOSIS — F411 Generalized anxiety disorder: Secondary | ICD-10-CM | POA: Diagnosis present

## 2019-08-14 DIAGNOSIS — R0602 Shortness of breath: Secondary | ICD-10-CM

## 2019-08-14 DIAGNOSIS — Z89619 Acquired absence of unspecified leg above knee: Secondary | ICD-10-CM

## 2019-08-14 DIAGNOSIS — Z4789 Encounter for other orthopedic aftercare: Secondary | ICD-10-CM | POA: Diagnosis not present

## 2019-08-14 DIAGNOSIS — Z4781 Encounter for orthopedic aftercare following surgical amputation: Secondary | ICD-10-CM | POA: Diagnosis not present

## 2019-08-14 DIAGNOSIS — E039 Hypothyroidism, unspecified: Secondary | ICD-10-CM | POA: Diagnosis present

## 2019-08-14 DIAGNOSIS — D72829 Elevated white blood cell count, unspecified: Secondary | ICD-10-CM | POA: Diagnosis not present

## 2019-08-14 DIAGNOSIS — R109 Unspecified abdominal pain: Secondary | ICD-10-CM | POA: Diagnosis not present

## 2019-08-14 DIAGNOSIS — Z9981 Dependence on supplemental oxygen: Secondary | ICD-10-CM | POA: Diagnosis not present

## 2019-08-14 DIAGNOSIS — M79672 Pain in left foot: Secondary | ICD-10-CM | POA: Diagnosis present

## 2019-08-14 DIAGNOSIS — F419 Anxiety disorder, unspecified: Secondary | ICD-10-CM | POA: Diagnosis present

## 2019-08-14 DIAGNOSIS — I480 Paroxysmal atrial fibrillation: Secondary | ICD-10-CM | POA: Diagnosis present

## 2019-08-14 DIAGNOSIS — I10 Essential (primary) hypertension: Secondary | ICD-10-CM | POA: Diagnosis not present

## 2019-08-14 DIAGNOSIS — N401 Enlarged prostate with lower urinary tract symptoms: Secondary | ICD-10-CM

## 2019-08-14 DIAGNOSIS — E861 Hypovolemia: Secondary | ICD-10-CM | POA: Diagnosis present

## 2019-08-14 DIAGNOSIS — R262 Difficulty in walking, not elsewhere classified: Secondary | ICD-10-CM | POA: Diagnosis not present

## 2019-08-14 DIAGNOSIS — J439 Emphysema, unspecified: Secondary | ICD-10-CM | POA: Diagnosis not present

## 2019-08-14 DIAGNOSIS — N400 Enlarged prostate without lower urinary tract symptoms: Secondary | ICD-10-CM | POA: Diagnosis present

## 2019-08-14 DIAGNOSIS — I5042 Chronic combined systolic (congestive) and diastolic (congestive) heart failure: Secondary | ICD-10-CM | POA: Diagnosis present

## 2019-08-14 DIAGNOSIS — J310 Chronic rhinitis: Secondary | ICD-10-CM | POA: Diagnosis present

## 2019-08-14 DIAGNOSIS — R498 Other voice and resonance disorders: Secondary | ICD-10-CM | POA: Diagnosis not present

## 2019-08-14 DIAGNOSIS — L89152 Pressure ulcer of sacral region, stage 2: Secondary | ICD-10-CM | POA: Diagnosis not present

## 2019-08-14 DIAGNOSIS — D72828 Other elevated white blood cell count: Secondary | ICD-10-CM | POA: Diagnosis not present

## 2019-08-14 DIAGNOSIS — J90 Pleural effusion, not elsewhere classified: Secondary | ICD-10-CM | POA: Diagnosis not present

## 2019-08-14 DIAGNOSIS — E8809 Other disorders of plasma-protein metabolism, not elsewhere classified: Secondary | ICD-10-CM | POA: Diagnosis not present

## 2019-08-14 DIAGNOSIS — M25551 Pain in right hip: Secondary | ICD-10-CM | POA: Diagnosis not present

## 2019-08-14 DIAGNOSIS — M6281 Muscle weakness (generalized): Secondary | ICD-10-CM | POA: Diagnosis not present

## 2019-08-14 DIAGNOSIS — I5032 Chronic diastolic (congestive) heart failure: Secondary | ICD-10-CM | POA: Diagnosis not present

## 2019-08-14 DIAGNOSIS — Z951 Presence of aortocoronary bypass graft: Secondary | ICD-10-CM | POA: Diagnosis not present

## 2019-08-14 DIAGNOSIS — Z20822 Contact with and (suspected) exposure to covid-19: Secondary | ICD-10-CM | POA: Diagnosis present

## 2019-08-14 DIAGNOSIS — Z87891 Personal history of nicotine dependence: Secondary | ICD-10-CM | POA: Diagnosis not present

## 2019-08-14 DIAGNOSIS — G44209 Tension-type headache, unspecified, not intractable: Secondary | ICD-10-CM | POA: Diagnosis present

## 2019-08-14 LAB — CBC WITH DIFFERENTIAL/PLATELET
Abs Immature Granulocytes: 0.15 10*3/uL — ABNORMAL HIGH (ref 0.00–0.07)
Basophils Absolute: 0 10*3/uL (ref 0.0–0.1)
Basophils Relative: 0 %
Eosinophils Absolute: 0.1 10*3/uL (ref 0.0–0.5)
Eosinophils Relative: 1 %
HCT: 28.3 % — ABNORMAL LOW (ref 39.0–52.0)
Hemoglobin: 9.1 g/dL — ABNORMAL LOW (ref 13.0–17.0)
Immature Granulocytes: 1 %
Lymphocytes Relative: 5 %
Lymphs Abs: 0.6 10*3/uL — ABNORMAL LOW (ref 0.7–4.0)
MCH: 31.1 pg (ref 26.0–34.0)
MCHC: 32.2 g/dL (ref 30.0–36.0)
MCV: 96.6 fL (ref 80.0–100.0)
Monocytes Absolute: 1.2 10*3/uL — ABNORMAL HIGH (ref 0.1–1.0)
Monocytes Relative: 10 %
Neutro Abs: 9.2 10*3/uL — ABNORMAL HIGH (ref 1.7–7.7)
Neutrophils Relative %: 83 %
Platelets: 257 10*3/uL (ref 150–400)
RBC: 2.93 MIL/uL — ABNORMAL LOW (ref 4.22–5.81)
RDW: 12.8 % (ref 11.5–15.5)
WBC: 11.2 10*3/uL — ABNORMAL HIGH (ref 4.0–10.5)
nRBC: 0 % (ref 0.0–0.2)

## 2019-08-14 LAB — BASIC METABOLIC PANEL
Anion gap: 7 (ref 5–15)
BUN: 18 mg/dL (ref 8–23)
CO2: 30 mmol/L (ref 22–32)
Calcium: 8.5 mg/dL — ABNORMAL LOW (ref 8.9–10.3)
Chloride: 93 mmol/L — ABNORMAL LOW (ref 98–111)
Creatinine, Ser: 0.89 mg/dL (ref 0.61–1.24)
GFR calc Af Amer: >60 mL/min (ref >60)
GFR calc non Af Amer: >60 mL/min (ref >60)
Glucose, Bld: 92 mg/dL (ref 70–99)
Potassium: 4.2 mmol/L (ref 3.5–5.1)
Sodium: 130 mmol/L — ABNORMAL LOW (ref 135–145)

## 2019-08-14 MED ORDER — HEPARIN SODIUM (PORCINE) 5000 UNIT/ML IJ SOLN: 5000.0000 [IU] | Freq: Three times a day (TID) | INTRAMUSCULAR | Status: DC

## 2019-08-14 MED ORDER — CHLORHEXIDINE GLUCONATE CLOTH 2 % EX PADS
6.0000 | MEDICATED_PAD | Freq: Every morning | CUTANEOUS | Status: DC
  Administered 2019-08-15 – 2019-08-19 (×4): 6 via TOPICAL

## 2019-08-14 MED ORDER — POLYETHYLENE GLYCOL 3350 17 G PO PACK
17.0000 g | PACK | Freq: Two times a day (BID) | ORAL | Status: DC
  Administered 2019-08-14 – 2019-08-18 (×6): 17 g via ORAL
  Filled 2019-08-14 (×10): qty 1

## 2019-08-14 MED ORDER — ALPRAZOLAM 0.25 MG PO TABS
0.1250 mg | ORAL_TABLET | Freq: Two times a day (BID) | ORAL | Status: DC | PRN
  Administered 2019-08-14 – 2019-08-15 (×2): 0.125 mg via ORAL
  Administered 2019-08-15 – 2019-08-20 (×8): 0.25 mg via ORAL
  Filled 2019-08-14 (×10): qty 1

## 2019-08-14 MED ORDER — DIPHENHYDRAMINE HCL 12.5 MG/5ML PO ELIX: 12.5000 mg | ORAL_SOLUTION | Freq: Four times a day (QID) | ORAL | Status: DC | PRN

## 2019-08-14 MED ORDER — POLYETHYLENE GLYCOL 3350 17 G PO PACK: 17.0000 g | PACK | Freq: Every day | ORAL | Status: DC

## 2019-08-14 MED ORDER — FLUTICASONE PROPIONATE 50 MCG/ACT NA SUSP
2.0000 | Freq: Every day | NASAL | Status: DC
  Administered 2019-08-15 – 2019-08-20 (×6): 2 via NASAL
  Filled 2019-08-14: qty 16

## 2019-08-14 MED ORDER — FLEET ENEMA 7-19 GM/118ML RE ENEM
1.0000 | ENEMA | Freq: Once | RECTAL | Status: DC
  Filled 2019-08-14: qty 1

## 2019-08-14 MED ORDER — UMECLIDINIUM BROMIDE 62.5 MCG/INH IN AEPB
1.0000 | INHALATION_SPRAY | Freq: Every day | RESPIRATORY_TRACT | Status: DC
  Administered 2019-08-15: 10:00:00 1 via RESPIRATORY_TRACT
  Filled 2019-08-14: qty 7

## 2019-08-14 MED ORDER — SPIRONOLACTONE 25 MG PO TABS
25.0000 mg | ORAL_TABLET | ORAL | Status: DC
  Administered 2019-08-16 – 2019-08-18 (×2): 25 mg via ORAL
  Filled 2019-08-14 (×2): qty 1

## 2019-08-14 MED ORDER — ASPIRIN EC 81 MG PO TBEC
81.0000 mg | DELAYED_RELEASE_TABLET | Freq: Every day | ORAL | Status: DC
  Administered 2019-08-15 – 2019-08-20 (×6): 81 mg via ORAL
  Filled 2019-08-14 (×7): qty 1

## 2019-08-14 MED ORDER — GUAIFENESIN-DM 100-10 MG/5ML PO SYRP: 5.0000 mL | ORAL_SOLUTION | Freq: Four times a day (QID) | ORAL | Status: DC | PRN

## 2019-08-14 MED ORDER — TRAZODONE HCL 50 MG PO TABS: 25.0000 mg | ORAL_TABLET | Freq: Every evening | ORAL | Status: DC | PRN

## 2019-08-14 MED ORDER — ENOXAPARIN SODIUM 40 MG/0.4ML ~~LOC~~ SOLN
40.0000 mg | Freq: Every day | SUBCUTANEOUS | Status: DC
  Administered 2019-08-14 – 2019-08-19 (×6): 40 mg via SUBCUTANEOUS
  Filled 2019-08-14 (×6): qty 0.4

## 2019-08-14 MED ORDER — NITROGLYCERIN 0.4 MG SL SUBL: 0.4000 mg | SUBLINGUAL_TABLET | SUBLINGUAL | Status: DC | PRN

## 2019-08-14 MED ORDER — FINASTERIDE 5 MG PO TABS
5.0000 mg | ORAL_TABLET | Freq: Every day | ORAL | Status: DC
  Administered 2019-08-15 – 2019-08-20 (×6): 5 mg via ORAL
  Filled 2019-08-14 (×6): qty 1

## 2019-08-14 MED ORDER — PROCHLORPERAZINE EDISYLATE 10 MG/2ML IJ SOLN: 5.0000 mg | Freq: Four times a day (QID) | INTRAMUSCULAR | Status: DC | PRN

## 2019-08-14 MED ORDER — BISACODYL 10 MG RE SUPP: 10.0000 mg | Freq: Every day | RECTAL | Status: DC | PRN

## 2019-08-14 MED ORDER — FLEET ENEMA 7-19 GM/118ML RE ENEM
1.0000 | ENEMA | Freq: Once | RECTAL | Status: DC | PRN
  Filled 2019-08-14: qty 1

## 2019-08-14 MED ORDER — SENNOSIDES-DOCUSATE SODIUM 8.6-50 MG PO TABS: 1.0000 | ORAL_TABLET | Freq: Every evening | ORAL | Status: DC | PRN

## 2019-08-14 MED ORDER — DOCUSATE SODIUM 100 MG PO CAPS
100.0000 mg | ORAL_CAPSULE | Freq: Every day | ORAL | Status: DC
  Administered 2019-08-15 – 2019-08-18 (×4): 100 mg via ORAL
  Filled 2019-08-14 (×5): qty 1

## 2019-08-14 MED ORDER — POLYETHYLENE GLYCOL 3350 17 G PO PACK
17.0000 g | PACK | Freq: Every day | ORAL | Status: DC | PRN
  Administered 2019-08-17: 17 g via ORAL

## 2019-08-14 MED ORDER — SPIRONOLACTONE 12.5 MG HALF TABLET
12.5000 mg | ORAL_TABLET | ORAL | Status: DC
  Administered 2019-08-15 – 2019-08-19 (×3): 12.5 mg via ORAL
  Filled 2019-08-14 (×3): qty 1

## 2019-08-14 MED ORDER — SODIUM CHLORIDE (HYPERTONIC) 5 % OP SOLN
1.0000 [drp] | Freq: Four times a day (QID) | OPHTHALMIC | Status: DC
  Administered 2019-08-14 – 2019-08-20 (×24): 1 [drp] via OPHTHALMIC
  Filled 2019-08-14: qty 15

## 2019-08-14 MED ORDER — PREDNISONE 5 MG PO TABS
10.0000 mg | ORAL_TABLET | Freq: Every morning | ORAL | Status: DC
  Administered 2019-08-15 – 2019-08-20 (×6): 10 mg via ORAL
  Filled 2019-08-14 (×6): qty 2

## 2019-08-14 MED ORDER — FUROSEMIDE 20 MG PO TABS
20.0000 mg | ORAL_TABLET | Freq: Two times a day (BID) | ORAL | Status: DC
  Administered 2019-08-14 – 2019-08-18 (×8): 20 mg via ORAL
  Filled 2019-08-14 (×9): qty 1

## 2019-08-14 MED ORDER — PROCHLORPERAZINE MALEATE 5 MG PO TABS: 5.0000 mg | ORAL_TABLET | Freq: Four times a day (QID) | ORAL | Status: DC | PRN

## 2019-08-14 MED ORDER — ENSURE ENLIVE PO LIQD
237.0000 mL | Freq: Two times a day (BID) | ORAL | Status: DC
  Administered 2019-08-15 – 2019-08-19 (×7): 237 mL via ORAL

## 2019-08-14 MED ORDER — CYCLOSPORINE 0.05 % OP EMUL
1.0000 [drp] | Freq: Two times a day (BID) | OPHTHALMIC | Status: DC
  Administered 2019-08-14 – 2019-08-20 (×10): 1 [drp] via OPHTHALMIC
  Filled 2019-08-14 (×15): qty 1

## 2019-08-14 MED ORDER — LEVALBUTEROL HCL 0.63 MG/3ML IN NEBU
0.6300 mg | INHALATION_SOLUTION | Freq: Four times a day (QID) | RESPIRATORY_TRACT | Status: DC | PRN
  Administered 2019-08-17: 0.63 mg via RESPIRATORY_TRACT
  Filled 2019-08-14: qty 3

## 2019-08-14 MED ORDER — ACETAMINOPHEN 325 MG PO TABS
325.0000 mg | ORAL_TABLET | ORAL | Status: DC | PRN
  Administered 2019-08-14 – 2019-08-19 (×12): 650 mg via ORAL
  Filled 2019-08-14 (×3): qty 2
  Filled 2019-08-14: qty 1
  Filled 2019-08-14 (×10): qty 2

## 2019-08-14 MED ORDER — ATORVASTATIN CALCIUM 10 MG PO TABS
20.0000 mg | ORAL_TABLET | Freq: Every day | ORAL | Status: DC
  Administered 2019-08-14 – 2019-08-19 (×6): 20 mg via ORAL
  Filled 2019-08-14 (×6): qty 2

## 2019-08-14 MED ORDER — FAMOTIDINE 20 MG PO TABS
20.0000 mg | ORAL_TABLET | Freq: Every day | ORAL | Status: DC
  Administered 2019-08-14 – 2019-08-19 (×6): 20 mg via ORAL
  Filled 2019-08-14 (×6): qty 1

## 2019-08-14 MED ORDER — HYDROCODONE-ACETAMINOPHEN 5-325 MG PO TABS
1.0000 | ORAL_TABLET | ORAL | Status: DC | PRN
  Administered 2019-08-16: 1 via ORAL
  Filled 2019-08-14: qty 1

## 2019-08-14 MED ORDER — PROCHLORPERAZINE 25 MG RE SUPP: 12.5000 mg | Freq: Four times a day (QID) | RECTAL | Status: DC | PRN

## 2019-08-14 MED ORDER — BUDESONIDE-FORMOTEROL FUMARATE 160-4.5 MCG/ACT IN AERO
2.0000 | INHALATION_SPRAY | Freq: Two times a day (BID) | RESPIRATORY_TRACT | Status: DC
  Administered 2019-08-14 – 2019-08-19 (×9): 2 via RESPIRATORY_TRACT

## 2019-08-14 MED ORDER — NON FORMULARY: 2.0000 | Freq: Two times a day (BID) | Status: DC

## 2019-08-14 MED ORDER — ALUM & MAG HYDROXIDE-SIMETH 200-200-20 MG/5ML PO SUSP
30.0000 mL | ORAL | Status: DC | PRN
  Administered 2019-08-19 – 2019-08-20 (×2): 30 mL via ORAL
  Filled 2019-08-14 (×2): qty 30

## 2019-08-14 MED ORDER — METOPROLOL SUCCINATE ER 25 MG PO TB24
12.5000 mg | ORAL_TABLET | Freq: Every day | ORAL | Status: DC
  Administered 2019-08-15 – 2019-08-20 (×6): 12.5 mg via ORAL
  Filled 2019-08-14 (×6): qty 1

## 2019-08-14 NOTE — Discharge Summary (Signed)
Physician Discharge Summary  ZETH BUDAY JWJ:[id number] DOB: 1929/10/29 DOA: 84/02/2019  PCP: [name], MD  Admit date: 08/11/2019 Discharge date: 08/14/2019  Admitted From: Home  disposition: CIR  Recommendations for Outpatient Follow-up:  1. Follow up with PCP in 1-2 weeks 2. Follow with vascular surgery per their scheduled appointment 3. Please obtain BMP/CBC in one week 4. Please follow up with your PCP on the following pending results: Unresulted Labs (From admission, onward) Comment         None       Home Health: None Equipment/Devices: None  Discharge Condition: Stable CODE STATUS: DNR Diet recommendation: Cardiac  Subjective: Seen and examined.  Wife at the bedside.  Still complains of weakness and right lower extremity pain.  No other complaint.  Brief/Interim Summary: 86 y.o.malewith medical history significant ofHTN, HLD, COPD, chronic respiratory failure on 2 L of oxygen, CADs/pCABG, chronic diastolic CHF, PVD s/pright SFA occlusion, PAF, BPH, anxiety depression, hard of hearing, recent hospitalization from 06/27/2019-07/02/2019 for chronic nonhealing wound of the right foot and found to have multilevel arterial occlusive disease for which vascular surgery had said that there were no revascularization options presented with right leg pain. Vascular surgery was consulted and patient underwent right AKA on 08/11/2019 by vascular surgery and subsequently admitted under hospitalist service.  He remained on 2 L of nasal oxygen which is his baseline due to having COPD and chronic hypoxic respiratory failure.  Leukocytosis improved.  Blood pressure remained controlled.  He was evaluated by PT OT who recommended CIR.  He was cleared by vascular surgery yesterday however he complained of chest pain and for that reason, his discharge was canceled.  Serial cardiac enzymes were followed which were negative.  Today he does not have any chest pain and he is hemodynamically stable  so he is being discharged today.  His only complaint is weakness in right lower extremity/stump pain.  He was reassured that he is going to CIR to get more physical therapy in order to get stronger for his weakness and that his pain should get better over time.  Discharge Diagnoses:  Active Problems:   Hyperlipidemia   Chronic respiratory failure with hypoxia and hypercapnia (HCC)   Coronary artery disease involving coronary bypass graft of native heart with angina pectoris (HCC)   BPH (benign prostatic hyperplasia)   Anxiety   Chronic diastolic CHF (congestive heart failure) (HCC)   Peripheral arterial disease (HCC)   Abnormal urinalysis   Critical lower limb ischemia    Discharge Instructions   Allergies as of 08/14/2019      Reactions   Cefdinir Diarrhea, Other (See Comments)   Severe Diarrhea   Nitrofurantoin Swelling, Other (See Comments)   Hand Swelling   Sulfa Antibiotics Anaphylaxis, Swelling   Sulfonamide Derivatives Swelling, Other (See Comments)   Facial/tongue swelling   Tape Other (See Comments)   SKIN IS VERY THIN AND TEARS EASILY!!!!! Please do NOT use "plastic" tape- USE PAPER!!   Ciprofloxacin Itching, Rash, Other (See Comments)   Red itchy hands   Nitrofurantoin Swelling, Other (See Comments)   Swollen hands   Amoxicillin Er Other (See Comments)   Frequest urination   Doxycycline Other (See Comments)   "Felt terrible"   Levaquin [levofloxacin] Itching, Rash   Sertraline Anxiety, Other (See Comments)   Makes the patient jittery      Medication List    STOP taking these medications   azithromycin 250 MG tablet Commonly known as: Zithromax  TAKE these medications   acetaminophen 500 MG tablet Commonly known as: TYLENOL Take 1,000 mg by mouth every 6 (six) hours as needed for moderate pain.   ALPRAZolam 0.25 MG tablet Commonly known as: XANAX TAKE 1 TABLET BY MOUTH THREE TIMES DAILY AS NEEDED What changed:   how much to take  how to take  this  when to take this  reasons to take this  additional instructions   aspirin 81 MG tablet Take 81 mg by mouth daily.   atorvastatin 20 MG tablet Commonly known as: LIPITOR TAKE 1 TABLET BY MOUTH EVERY DAY What changed: when to take this   finasteride 5 MG tablet Commonly known as: PROSCAR Take 5 mg by mouth daily with lunch.   fluticasone 50 MCG/ACT nasal spray Commonly known as: FLONASE Place 2 sprays into both nostrils daily.   furosemide 20 MG tablet Commonly known as: LASIX Take 1 tablet (20 mg total) by mouth 2 (two) times daily.   levalbuterol 0.63 MG/3ML nebulizer solution Commonly known as: XOPENEX USE 1 VIAL VIA NEBULIZER EVERY 4 HOURS AS NEEDED FOR WHEEZING OR SHORTNESS OF BREATH What changed:   how much to take  how to take this  when to take this  reasons to take this  additional instructions   levalbuterol 45 MCG/ACT inhaler Commonly known as: Xopenex HFA Inhale 2 puffs into the lungs every 4 (four) hours as needed for wheezing or shortness of breath.   metoprolol succinate 25 MG 24 hr tablet Commonly known as: TOPROL-XL Take 0.5 tablets (12.5 mg total) by mouth daily.   mometasone 50 MCG/ACT nasal spray Commonly known as: NASONEX Place 2 sprays into the nose daily.   morphine 15 MG tablet Commonly known as: MSIR Take 15 mg by mouth every 4 (four) hours as needed for severe pain.   Nitrostat 0.4 MG SL tablet Generic drug: nitroGLYCERIN Place 0.4 mg under the tongue every 5 (five) minutes as needed for chest pain.   OXYGEN Inhale 2 L/min into the lungs continuous.   Pepcid 20 MG tablet Generic drug: famotidine Take 20 mg by mouth at bedtime.   predniSONE 10 MG tablet Commonly known as: DELTASONE FOR FLARE OF COUGH/WHEEZING MAY INCREASE TO 2 DAILY BETTER, THEN 1 DAILY What changed:   how much to take  how to take this  when to take this  additional instructions   PRESCRIPTION MEDICATION See admin instructions. [place]  Trilogy ventilator- At bedtime   PreserVision AREDS 2 Caps Take 1 capsule by mouth daily.   silodosin 4 MG Caps capsule Commonly known as: RAPAFLO Take 4 mg by mouth daily.   sodium chloride 5 % ophthalmic solution Commonly known as: MURO 128 Place 1 drop into both eyes 4 (four) times daily.   Spiriva Respimat 2.5 MCG/ACT Aers Generic drug: Tiotropium Bromide Monohydrate INHALE 2 PUFFS BY MOUTH EVERY DAY What changed:   how much to take  how to take this  when to take this  additional instructions   spironolactone 25 MG tablet Commonly known as: ALDACTONE Take 12.5-25 mg by mouth See admin instructions. 12.5 mg on day and 25 mg the next day, pt alternates dosages   Symbicort 160-4.5 MCG/ACT inhaler Generic drug: budesonide-formoterol Inhale 2 puffs into the lungs 2 (two) times daily.   vitamin B-12 1000 MCG tablet Commonly known as: CYANOCOBALAMIN Take 1,000 mcg by mouth daily.   Vitamin D3 50 MCG (2000 UT) Tabs Take 2,000 Units by mouth daily with lunch.  Follow-up Information    [name], MD Follow up in 1 week(s).   Specialty: Family Medicine Contact information: [place] [address] [phone]        Belva Crome, MD .   Specialty: Cardiology Contact information: [phone] [place] [address] [phone]              Allergies  Allergen Reactions  . Cefdinir Diarrhea and Other (See Comments)    Severe Diarrhea  . Nitrofurantoin Swelling and Other (See Comments)    Hand Swelling  . Sulfa Antibiotics Anaphylaxis and Swelling  . Sulfonamide Derivatives Swelling and Other (See Comments)    Facial/tongue swelling  . Tape Other (See Comments)    SKIN IS VERY THIN AND TEARS EASILY!!!!! Please do NOT use "plastic" tape- USE PAPER!!  . Ciprofloxacin Itching, Rash and Other (See Comments)    Red itchy hands  . Nitrofurantoin Swelling and Other (See Comments)    Swollen hands  .  Amoxicillin Er Other (See Comments)    Frequest urination  . Doxycycline Other (See Comments)    "Felt terrible"  . Levaquin [Levofloxacin] Itching and Rash  . Sertraline Anxiety and Other (See Comments)    Makes the patient jittery    Consultations: Vascular surgery   Procedures/Studies:  No results found.   Discharge Exam: Vitals:   08/14/19 0700 08/14/19 0800  BP: 115/79 125/72  Pulse: (!) 104 (!) 102  Resp: (!) 22 20  Temp:  98.2 F (36.8 C)  SpO2: 96% 98%   Vitals:   08/14/19 0400 08/14/19 0600 08/14/19 0700 08/14/19 0800  BP: (!) 146/80 123/63 115/79 125/72  Pulse: (!) 102 (!) 105 (!) 104 (!) 102  Resp: 13 12 (!) 22 20  Temp: 98.2 F (36.8 C) 98.1 F (36.7 C)  98.2 F (36.8 C)  TempSrc: Oral Oral  Oral  SpO2: 99% 97% 96% 98%  Weight:   61.1 kg   Height:        General: Pt is alert, awake, not in acute distress Cardiovascular: RRR, S1/S2 +, no rubs, no gallops Respiratory: CTA bilaterally, no wheezing, no rhonchi Abdominal: Soft, NT, ND, bowel sounds + Extremities: Right AKA/dressing in place.    The results of significant diagnostics from this hospitalization (including imaging, microbiology, ancillary and laboratory) are listed below for reference.     Microbiology: Recent Results (from the past 240 hour(s))  SARS CORONAVIRUS 2 (TAT 6-24 HRS) Nasopharyngeal Nasopharyngeal Swab     Status: None   Collection Time: 08/08/19  3:31 PM   Specimen: Nasopharyngeal Swab  Result Value Ref Range Status   SARS Coronavirus 2 NEGATIVE NEGATIVE Final    Comment: (NOTE) SARS-CoV-2 target nucleic acids are NOT DETECTED.  The SARS-CoV-2 RNA is generally detectable in upper and lower respiratory specimens during the acute phase of infection. Negative results do not preclude SARS-CoV-2 infection, do not rule out co-infections with other pathogens, and should not be used as the sole basis for treatment or other patient management decisions. Negative results must  be combined with clinical observations, patient history, and epidemiological information. The expected result is Negative.  Fact Sheet for Patients: SugarRoll.be  Fact Sheet for Healthcare Providers: https://www.woods-mathews.com/  This test is not yet approved or cleared by the [place] FDA and  has been authorized for detection and/or diagnosis of SARS-CoV-2 by FDA under an Emergency Use Authorization (EUA). This EUA will remain  in effect (meaning this test can  be used) for the duration of the COVID-19 declaration under Se ction 564(b)(1) of the Act, 21 U.S.C. section 360bbb-3(b)(1), unless the authorization is terminated or revoked sooner.  Performed at [facility], [place] [address] [place], [place] [address]   MRSA PCR Screening     Status: None   Collection Time: 08/11/19  7:33 PM   Specimen: Nasal Mucosa; Nasopharyngeal  Result Value Ref Range Status   MRSA by PCR NEGATIVE NEGATIVE Final    Comment:        The GeneXpert MRSA Assay (FDA approved for NASAL specimens only), is one component of a comprehensive MRSA colonization surveillance program. It is not intended to diagnose MRSA infection nor to guide or monitor treatment for MRSA infections. Performed at [facility], [place] [address] [place], [place] [address]   Culture, Urine     Status: Abnormal   Collection Time: 08/11/19  8:15 PM   Specimen: Urine, Catheterized  Result Value Ref Range Status   Specimen Description URINE, CATHETERIZED  Final   Special Requests   Final    NONE Performed at [facility], [address] [place],  [address]    Culture MULTIPLE SPECIES PRESENT, SUGGEST RECOLLECTION (A)  Final   Report Status 08/13/2019 FINAL  Final     Labs: BNP (last 3 results) No results for input(s): BNP in the last 8760 hours. Basic Metabolic Panel: Recent Labs  Lab 08/11/19 0715 08/13/19 0317 08/14/19 0047  NA 136 130* 130*   K 4.1 5.0 4.2  CL 95* 94* 93*  CO2 30 26 30   GLUCOSE 96 84 92  BUN 27* 17 18  CREATININE 1.20 0.90 0.89  CALCIUM 9.4 8.3* 8.5*  MG  --  1.8  --    Liver Function Tests: Recent Labs  Lab 08/11/19 0715  AST 35  ALT 43  ALKPHOS 77  BILITOT 0.8  PROT 6.0*  ALBUMIN 3.4*   No results for input(s): LIPASE, AMYLASE in the last 168 hours. No results for input(s): AMMONIA in the last 168 hours. CBC: Recent Labs  Lab 08/11/19 0715 08/12/19 0135 08/13/19 0317 08/14/19 0047  WBC 12.9* 14.3* 10.9* 11.2*  NEUTROABS  --   --   --  9.2*  HGB 11.2* 10.2* 9.5* 9.1*  HCT 36.0* 32.0* 28.2* 28.3*  MCV 99.7 98.5 95.9 96.6  PLT 281 262 261 257   Cardiac Enzymes: No results for input(s): CKTOTAL, CKMB, CKMBINDEX, TROPONINI in the last 168 hours. BNP: Invalid input(s): POCBNP CBG: No results for input(s): GLUCAP in the last 168 hours. D-Dimer No results for input(s): DDIMER in the last 72 hours. Hgb A1c No results for input(s): HGBA1C in the last 72 hours. Lipid Profile No results for input(s): CHOL, HDL, LDLCALC, TRIG, CHOLHDL, LDLDIRECT in the last 72 hours. Thyroid function studies No results for input(s): TSH, T4TOTAL, T3FREE, THYROIDAB in the last 72 hours.  Invalid input(s): FREET3 Anemia work up No results for input(s): VITAMINB12, FOLATE, FERRITIN, TIBC, IRON, RETICCTPCT in the last 72 hours. Urinalysis    Component Value Date/Time   COLORURINE YELLOW 08/11/2019 0633   APPEARANCEUR HAZY (A) 08/11/2019 0633   LABSPEC 1.011 08/11/2019 0633   PHURINE 6.0 08/11/2019 0633   GLUCOSEU NEGATIVE 08/11/2019 0633   HGBUR SMALL (A) 08/11/2019 [place] NEGATIVE 08/11/2019 [place] 08/11/2019 0633   PROTEINUR NEGATIVE 08/11/2019 0633   NITRITE NEGATIVE 08/11/2019 0633   LEUKOCYTESUR LARGE (A) 08/11/2019 0865  Sepsis Labs Invalid input(s): PROCALCITONIN,  WBC,  LACTICIDVEN Microbiology Recent Results (from the past 240 hour(s))  SARS CORONAVIRUS 2  (TAT 6-24 HRS) Nasopharyngeal Nasopharyngeal Swab     Status: None   Collection Time: 08/08/19  3:31 PM   Specimen: Nasopharyngeal Swab  Result Value Ref Range Status   SARS Coronavirus 2 NEGATIVE NEGATIVE Final    Comment: (NOTE) SARS-CoV-2 target nucleic acids are NOT DETECTED.  The SARS-CoV-2 RNA is generally detectable in upper and lower respiratory specimens during the acute phase of infection. Negative results do not preclude SARS-CoV-2 infection, do not rule out co-infections with other pathogens, and should not be used as the sole basis for treatment or other patient management decisions. Negative results must be combined with clinical observations, patient history, and epidemiological information. The expected result is Negative.  Fact Sheet for Patients: SugarRoll.be  Fact Sheet for Healthcare Providers: https://www.woods-mathews.com/  This test is not yet approved or cleared by the [place] FDA and  has been authorized for detection and/or diagnosis of SARS-CoV-2 by FDA under an Emergency Use Authorization (EUA). This EUA will remain  in effect (meaning this test can be used) for the duration of the COVID-19 declaration under Se ction 564(b)(1) of the Act, 21 U.S.C. section 360bbb-3(b)(1), unless the authorization is terminated or revoked sooner.  Performed at [facility], [place] [address] [place], [place] [address]   MRSA PCR Screening     Status: None   Collection Time: 08/11/19  7:33 PM   Specimen: Nasal Mucosa; Nasopharyngeal  Result Value Ref Range Status   MRSA by PCR NEGATIVE NEGATIVE Final    Comment:        The GeneXpert MRSA Assay (FDA approved for NASAL specimens only), is one component of a comprehensive MRSA colonization surveillance program. It is not intended to diagnose MRSA infection nor to guide or monitor treatment for MRSA infections. Performed at [facility], [place] [address] [place], [place] [address]   Culture, Urine     Status: Abnormal   Collection Time: 08/11/19  8:15 PM   Specimen: Urine, Catheterized  Result Value Ref Range Status   Specimen Description URINE, CATHETERIZED  Final   Special Requests   Final    NONE Performed at [facility], [address], [place], [place] [address]    Culture MULTIPLE SPECIES PRESENT, SUGGEST RECOLLECTION (A)  Final   Report Status 08/13/2019 FINAL  Final     Time coordinating discharge: Over 30 minutes  SIGNED:   Darliss Cheney, MD  [facility] 08/14/2019, 8:44 AM  If 7PM-7AM, please contact night-coverage [email or web address]

## 2019-08-14 NOTE — Progress Notes (Addendum)
Inpatient Rehabilitation Medication Review by a Pharmacist  A complete drug regimen review was completed for this patient to identify any potential clinically significant medication issues.  Clinically significant medication issues were identified:  No  Check AMION for pharmacist assigned to patient if future medication questions/issues arise during this admission.  Pharmacist Comments:  Patient's wife is asking for patient to use their own Afrin nasal spray, Systane Ultra lubricating eyedrops and Muro-128 eye drops (possibly in addition to cyclosporine 0.05% ophth emulsion that has been ordered or may not realize they may no longer be needed since cyclosporine added), and Spiriva Respimat (instead of Incruse Ellipta).  Please follow-up with team tomorrow.    Time spent performing this drug regimen review (minutes):  15   Efraim Kaufmann, PharmD, BCPS 08/14/2019 4:55 PM

## 2019-08-14 NOTE — Progress Notes (Signed)
Patient arrived to unit A/O x4 accompanied by wife. Patient fall prevention safety plan, and vistors policy has been reviewed with patient and family. Patient has home med's at beside to be used that have been verified with pharmacy. Pt denies pain. Dressing for stump has been assessed and dressing has been changed. Amanda Cockayne, LPN

## 2019-08-14 NOTE — IPOC Note (Signed)
Individualized overall Plan of Care [facility]) Patient Details Name: [name] MRN: [id number] DOB: 06/14/1929  Admitting Diagnosis: S/P AKA (above knee amputation) [facility])  Hospital Problems: Principal Problem:   S/P AKA (above knee amputation) ([facility]) Active Problems:   Hypoalbuminemia due to protein-calorie malnutrition (HCC)   Transaminitis   Acute blood loss anemia   Hyponatremia   Postoperative pain   Supplemental oxygen dependent     Functional Problem List: Nursing Bladder, Medication Management, Pain, Edema  PT Balance, Endurance, Motor, Pain, Perception, Sensory, Skin Integrity  OT Balance, Edema, Endurance, Pain, Safety, Skin Integrity  SLP    TR         Basic ADL's: OT Bathing, Dressing, Toileting, Grooming     Advanced  ADL's: OT       Transfers: PT Bed Mobility, Bed to Chair, Car, Manufacturing systems engineer, Metallurgist: PT Ambulation, Emergency planning/management officer, Stairs     Additional Impairments: OT None  SLP        TR      Anticipated Outcomes Item Anticipated Outcome  Self Feeding Independent  Swallowing      Basic self-care  Supervision A  Toileting  Supervision A   Bathroom Transfers Supervision A  Bowel/Bladder  cont of bowel, chronic foley min assist with care  Transfers  supervision with LRAD  Locomotion  NA  Communication     Cognition     Pain  less than 4   Safety/Judgment  cues/reminders   Therapy Plan: PT Intensity: Minimum of 1-2 x/day ,45 to 90 minutes PT Frequency: 5 out of 7 days PT Duration Estimated Length of Stay: 14-18 days OT Intensity: Minimum of 1-2 x/day, 45 to 90 minutes OT Frequency: 5 out of 7 days OT Duration/Estimated Length of Stay: 10-14 days      Team Interventions: Nursing Interventions Patient/Family Education, Bowel Management, Disease Management/Prevention, Pain Management, Skin Care/Wound Management, Discharge Planning  PT interventions Ambulation/gait training, Discharge planning,  Functional mobility training, Psychosocial support, Therapeutic Activities, Balance/vestibular training, Disease management/prevention, Neuromuscular re-education, Skin care/wound management, Therapeutic Exercise, Wheelchair propulsion/positioning, DME/adaptive equipment instruction, Pain management, Splinting/orthotics, UE/LE Strength taining/ROM, Community reintegration, Technical sales engineer stimulation, Barrister's clerk education, IT trainer, UE/LE Coordination activities  OT Interventions Training and development officer, Academic librarian, Discharge planning, DME/adaptive equipment instruction, Functional mobility training, Pain management, Patient/family education, Self Care/advanced ADL retraining, Psychosocial support, Therapeutic Activities, Therapeutic Exercise, UE/LE Strength taining/ROM, Wheelchair propulsion/positioning  SLP Interventions    TR Interventions    SW/CM Interventions Discharge Planning, Psychosocial Support, Patient/Family Education   Barriers to Discharge MD  Medical stability, Wound care, Weight, and Weight bearing restrictions  Nursing      PT Inaccessible home environment, Home environment access/layout, Wound Care, Weight bearing restrictions multiple sets of steps inside home with 1 rail  OT Inaccessible home environment, Home environment access/layout    SLP      SW Inaccessible home environment, Home environment access/layout 15 Steps to get to bed room. No bathroom on 1st floor   Team Discharge Planning: Destination: PT-Home ,OT- Home , SLP-  Projected Follow-up: PT-Home health PT, OT-  Home health OT, 24 hour supervision/assistance, SLP-  Projected Equipment Needs: PT-To be determined, OT- 3 in 1 bedside comode, SLP-  Equipment Details: PT-has RW and SPC, OT-Patient may benefit from wide drop-arm BSC Patient/family involved in discharge planning: PT- Patient, Family member/caregiver,  OT-Patient, SLP-   MD ELOS: 12-16 days. Medical Rehab Prognosis:   Good Assessment: Male with history of COPD- chronic hypoxic  respiratory failure and 2 L oxygen dependent, BPH- foley for retention, PAF, CAD s/p CABG, PVD with chronic nonhealing wound on right foot and no options for revascularizations who was admitted on 08/11/19 for R-AKA by Dr. Scot Dock. Chronic diastolic heart failure being monitored and was placed on 1500cc FR.  Pain control and weakness continued to be an issues. Had issues with orthostatic symptoms with attempts at sitting at EOB. Patient with resulting functional deficits with mobility, tranfers, endurance, self-care.  Will set goals for Supervision with PT/OT.   Due to the current state of emergency, patients may not be receiving their 3-hours of Medicare-mandated therapy.  See Team Conference Notes for weekly updates to the plan of care

## 2019-08-14 NOTE — H&P (Addendum)
Physical Medicine and Rehabilitation Admission H&P    CC: Functional deficits due to R-AKA  HPI: [name] is an 86 year old male with history of COPD- chronic hypoxic respiratory failure and 2 L oxygen dependent, BPH- foley for retention, PAF, CAD s/p CABG, PVD with chronic nonhealing wound on right foot and no options for revascularizations who was admitted on 08/11/19 for R-AKA by Dr. Scot Dock. Chronic diastolic heart failure being monitored and was placed on 1500cc FR.  Pain control and weakness continued to be an issues. Had issues with orthostatic symptoms with attempts at sitting at EOB. Therapy ongoing and CIR recommended due to functional decline.   Review of Systems  Unable to perform ROS: Other  Constitutional: Negative for chills and fever.  HENT: Positive for hearing loss.   Eyes:       Vision problems?  Respiratory: Positive for wheezing.   Cardiovascular: Positive for leg swelling (LLE tends to swell a lot  more ). Negative for chest pain.  Genitourinary:       Urinary retention since June. Has failed 2 voiding trials.   Skin: Negative for rash.  Neurological: Positive for weakness.     Past Medical History:  Diagnosis Date  . Anxiety   . Arthritis    "generalized" (04/04/2017)  . Asthma   . Basal cell carcinoma    "left ear"  . BPH (benign prostatic hyperplasia)    severe; s/p multiple biopsies  . CAD (coronary artery disease)   . Carotid artery occlusion   . Chronic respiratory failure ([place])   . Chronic rhinitis   . COPD (chronic obstructive pulmonary disease) (HCC)    2L [place] O2  . Diastolic heart failure ([place]) 2019  . Dilation of biliary tract   . Elevated troponin 03/09/2017  . Gallstones   . GERD (gastroesophageal reflux disease)   . History of blood transfusion    "w/his CABG" (04/04/2017)  . History of kidney stones   . Hyperlipidemia   . Hypertension   . On home oxygen therapy    "~ 24/7" (04/04/2017)  . Peripheral vascular disease ([place])   .  Pneumonia 2019  . Squamous cell carcinoma of skin 04/23/2013   in situ-Right flank (txpbx)  . Squamous cell carcinoma of skin 01/23/2017   [place]/ in siut- Left ear rim (CX3FU)  . Syncope and collapse   . Ureteral tumor 08/2015   had endoscopic procedure for evaluation, unable to reach for biopsy  . Urinary catheter (Foley) change required     Past Surgical History:  Procedure Laterality Date  . ABDOMINAL AORTOGRAM W/LOWER EXTREMITY Bilateral 06/30/2019   Procedure: ABDOMINAL AORTOGRAM W/LOWER EXTREMITY;  Surgeon: Waynetta Sandy, MD;  Location: [facility];  Service: Cardiovascular;  Laterality: Bilateral;  . AMPUTATION Right 08/11/2019   Procedure: RIGHT ABOVE KNEE AMPUTATION;  Surgeon: Angelia Mould, MD;  Location: [facility];  Service: Vascular;  Laterality: Right;  . BASAL CELL CARCINOMA EXCISION Left    ear  . CARDIAC CATHETERIZATION     "prior to bypass"  . CATARACT EXTRACTION W/ INTRAOCULAR LENS  IMPLANT, BILATERAL Bilateral   . CORONARY ARTERY BYPASS GRAFT  10/2001   LIMA to LAD, SVG to OM1-2, SVG to RCA and PDA  . LITHOTRIPSY    . PROSTATE BIOPSY  Oct. 2014  . TONSILLECTOMY      Family History  Problem Relation Age of Onset  . Heart disease Mother   . Heart disease Father  Before age 32  . Breast cancer Mother   . Cancer Mother        Breast cancer  . Hypertension Mother   . Other Mother        AAA  and   Amputation  . Heart attack Father   . Stroke Brother        x3, still living   . Peripheral vascular disease Brother   . Heart disease Brother   . Hyperlipidemia Brother   . Hypertension Brother   . Heart disease Brother   . Allergies Brother     Social History: Married. Independent with walker for past few weeks. Per  reports that he quit smoking about 37 years ago. His smoking use included cigarettes. He has a 66.00 pack-year smoking history. He has never used smokeless tobacco. He reports current alcohol use-- a shot of bourbon/  2weeks . He reports that he does not use drugs.    Allergies  Allergen Reactions  . Cefdinir Diarrhea and Other (See Comments)    Severe Diarrhea  . Nitrofurantoin Swelling and Other (See Comments)    Hand Swelling  . Sulfa Antibiotics Anaphylaxis and Swelling  . Sulfonamide Derivatives Swelling and Other (See Comments)    Facial/tongue swelling  . Tape Other (See Comments)    SKIN IS VERY THIN AND TEARS EASILY!!!!! Please do NOT use "plastic" tape- USE PAPER!!  . Ciprofloxacin Itching, Rash and Other (See Comments)    Red itchy hands  . Nitrofurantoin Swelling and Other (See Comments)    Swollen hands  . Amoxicillin Er Other (See Comments)    Frequest urination  . Doxycycline Other (See Comments)    "Felt terrible"  . Levaquin [Levofloxacin] Itching and Rash  . Sertraline Anxiety and Other (See Comments)    Makes the patient jittery   Medications Prior to Admission  Medication Sig Dispense Refill  . acetaminophen (TYLENOL) 500 MG tablet Take 1,000 mg by mouth every 6 (six) hours as needed for moderate pain.     Marland Kitchen ALPRAZolam (XANAX) 0.25 MG tablet TAKE 1 TABLET BY MOUTH THREE TIMES DAILY AS NEEDED (Patient taking differently: Take 0.125-0.25 mg by mouth 3 (three) times daily as needed for anxiety. ) 90 tablet 3  . aspirin 81 MG tablet Take 81 mg by mouth daily.    Marland Kitchen atorvastatin (LIPITOR) 20 MG tablet TAKE 1 TABLET BY MOUTH EVERY DAY (Patient taking differently: Take 20 mg by mouth at bedtime. ) 90 tablet 2  . Cholecalciferol (VITAMIN D3) 2000 units TABS Take 2,000 Units by mouth daily with lunch.     . famotidine (PEPCID) 20 MG tablet Take 20 mg by mouth at bedtime.     . finasteride (PROSCAR) 5 MG tablet Take 5 mg by mouth daily with lunch.     . fluticasone (FLONASE) 50 MCG/ACT nasal spray Place 2 sprays into both nostrils daily.    . furosemide (LASIX) 20 MG tablet Take 1 tablet (20 mg total) by mouth 2 (two) times daily. 60 tablet 3  . levalbuterol (XOPENEX HFA) 45 MCG/ACT  inhaler Inhale 2 puffs into the lungs every 4 (four) hours as needed for wheezing or shortness of breath. 15 g 5  . levalbuterol (XOPENEX) 0.63 MG/3ML nebulizer solution USE 1 VIAL VIA NEBULIZER EVERY 4 HOURS AS NEEDED FOR WHEEZING OR SHORTNESS OF BREATH (Patient taking differently: Take 0.63 mg by nebulization every 4 (four) hours as needed for wheezing or shortness of breath. ) 300 mL 3  . metoprolol  succinate (TOPROL-XL) 25 MG 24 hr tablet Take 0.5 tablets (12.5 mg total) by mouth daily. 45 tablet 3  . mometasone (NASONEX) 50 MCG/ACT nasal spray Place 2 sprays into the nose daily. 17 g 5  . morphine (MSIR) 15 MG tablet Take 15 mg by mouth every 4 (four) hours as needed for severe pain.    . Multiple Vitamins-Minerals (PRESERVISION AREDS 2) CAPS Take 1 capsule by mouth daily.    Marland Kitchen NITROSTAT 0.4 MG SL tablet Place 0.4 mg under the tongue every 5 (five) minutes as needed for chest pain.    . OXYGEN Inhale 2 L/min into the lungs continuous.     . predniSONE (DELTASONE) 10 MG tablet FOR FLARE OF COUGH/WHEEZING MAY INCREASE TO 2 DAILY BETTER, THEN 1 DAILY (Patient taking differently: Take 10-20 mg by mouth in the morning. Take 10 mg by mouth in the morning and may increase to 20 mg as needed for coughing/wheezing (then decrease back to 10 mg once daily)) 100 tablet 2  . PRESCRIPTION MEDICATION See admin instructions. [employer] Trilogy ventilator- At bedtime    . silodosin (RAPAFLO) 4 MG CAPS capsule Take 4 mg by mouth daily.    . sodium chloride (MURO 128) 5 % ophthalmic solution Place 1 drop into both eyes 4 (four) times daily.    Marland Kitchen spironolactone (ALDACTONE) 25 MG tablet Take 12.5-25 mg by mouth See admin instructions. 12.5 mg on day and 25 mg the next day, pt alternates dosages    . SYMBICORT 160-4.5 MCG/ACT inhaler Inhale 2 puffs into the lungs 2 (two) times daily. 1 Inhaler 5  . Tiotropium Bromide Monohydrate (SPIRIVA RESPIMAT) 2.5 MCG/ACT AERS INHALE 2 PUFFS BY MOUTH EVERY DAY (Patient taking  differently: Inhale 2 puffs into the lungs daily. ) 4 g 11  . vitamin B-12 (CYANOCOBALAMIN) 1000 MCG tablet Take 1,000 mcg by mouth daily.       Drug Regimen Review  Drug regimen was reviewed and remains appropriate with no significant issues identified  Home: Home Living Family/patient expects to be discharged to:: Private residence Living Arrangements: Spouse/significant other Available Help at Discharge: Family, Available 24 hours/day Type of Home: House Home Access: Level entry Home Layout: Multi-level, Bed/bath upstairs Alternate Level Stairs-Number of Steps: 9 to kitchen/ living, 6 additional to bedroom Bathroom Shower/Tub: Multimedia programmer: Standard Home Equipment: Environmental consultant - 2 wheels, [employer] - single point, Civil engineer, contracting, Futures trader History: Prior Function Level of Independence: Independent with assistive device(s), Needs assistance Gait / Transfers Assistance Needed: uses RW for mobility the last few weeks, wife ADL's / Homemaking Assistance Needed: wife has been assiting with bathing and dressing  Functional Status:  Mobility: Bed Mobility Overal bed mobility: Needs Assistance Bed Mobility: Rolling Rolling: Min assist Supine to sit: HOB elevated, Min assist General bed mobility comments: minA for pulling trunk forwards away from [facility] while HOB elevated. completed x2 Transfers Overall transfer level: Needs assistance Transfers: Sit to/from Stand Sit to Stand: Min assist General transfer comment: declined will attempt next session Ambulation/Gait General Gait Details: not yet able  ADL: ADL Overall ADL's : Needs assistance/impaired Eating/Feeding: Minimal assistance, Bed level Eating/Feeding Details (indicate cue type and reason): eating lunch on arrival. pt with setup by wife for self feeding.  Grooming: Wash/dry face, Set up, Bed level, Brushing hair, Supervision/safety Grooming Details (indicate cue type and reason): pt requiring  rest breaks throughout activity  General ADL Comments: pt reports having had a rough day (with chest pain earlier today  and then hypotensive after receiving meds), requiring some encouragement to participate. pt notably fatigued and requiring rest breaks throughout   Cognition: Cognition Overall Cognitive Status: Within Functional Limits for tasks assessed Orientation Level: Oriented X4 Cognition Arousal/Alertness: Lethargic, Suspect due to medications Behavior During Therapy: WFL for tasks assessed/performed Overall Cognitive Status: Within Functional Limits for tasks assessed Area of Impairment: Following commands, Problem solving Following Commands: Follows one step commands with increased time Problem Solving: Slow processing General Comments: per wife pt is very detail oriented and specific and tends to take a long time to accomplish tasks   There were no vitals taken for this visit. Physical Exam  General: frail, fatigued, wife at bedside HEENT: Head is normocephalic, atraumatic, PERRLA, EOMI, sclera anicteric, oral mucosa pink and moist, dentition intact, ext ear canals clear,  Neck: Supple without JVD or lymphadenopathy Heart: Tachycardic. No murmurs rubs or gallops Chest: [place] in place 4L, satting 98%. Audible wheezing at times with increased WOB with exam.  Abdomen: Soft, non-tender, non-distended, bowel sounds positive. Extremities: No clubbing or cyanosis. 1+ pitting edema LLE. BUE with paper thin ecchymotic skin with edema. Pulses are 2+ Skin: Right AKA with expected bruising, staples C/D/I Neuro: Alert and oriented. Diffuse weakness Psych: Mood is down regarding his condition. Pt is cooperative GU: Foley in place    Results for orders placed or performed during the hospital encounter of 08/11/19 (from the past 48 hour(s))  CBC     Status: Abnormal   Collection Time: 08/13/19  3:17 AM  Result Value Ref Range   WBC 10.9 (H) 4.0 - 10.5 K/uL   RBC 2.94 (L) 4.22 - 5.81  MIL/uL   Hemoglobin 9.5 (L) 13.0 - 17.0 g/dL   HCT 28.2 (L) 39 - 52 %   MCV 95.9 80.0 - 100.0 fL   MCH 32.3 26.0 - 34.0 pg   MCHC 33.7 30.0 - 36.0 g/dL   RDW 12.9 11.5 - 15.5 %   Platelets 261 150 - 400 K/uL   nRBC 0.0 0.0 - 0.2 %    Comment: Performed at [facility], [place] [name], [place], [place] [address]  Basic metabolic panel     Status: Abnormal   Collection Time: 08/13/19  3:17 AM  Result Value Ref Range   Sodium 130 (L) 135 - 145 mmol/L   Potassium 5.0 3.5 - 5.1 mmol/L    Comment: HEMOLYSIS AT THIS LEVEL MAY AFFECT RESULT   Chloride 94 (L) 98 - 111 mmol/L   CO2 26 22 - 32 mmol/L   Glucose, Bld 84 70 - 99 mg/dL    Comment: Glucose reference range applies only to samples taken after fasting for at least 8 hours.   BUN 17 8 - 23 mg/dL   Creatinine, Ser 0.90 0.61 - 1.24 mg/dL   Calcium 8.3 (L) 8.9 - 10.3 mg/dL   GFR calc non Af Amer >60 >60 mL/min   GFR calc Af Amer >60 >60 mL/min   Anion gap 10 5 - 15    Comment: Performed at [facility] [address], [place], [place] [address]  Magnesium     Status: None   Collection Time: 08/13/19  3:17 AM  Result Value Ref Range   Magnesium 1.8 1.7 - 2.4 mg/dL    Comment: Performed at [facility] [address], [place], [place] [address]  Troponin I (High Sensitivity)     Status: Abnormal   Collection Time: 08/13/19  1:39 PM  Result Value Ref Range   Troponin I (High Sensitivity) 61 (H) <18 ng/L    Comment: (NOTE) Elevated high sensitivity troponin I (hsTnI) values and significant  changes across serial measurements may suggest ACS but many other  chronic and acute conditions are known to elevate hsTnI results.  Refer to the "Links" section for chest pain algorithms and additional  guidance. Performed at [facility], [place] [address], [place], [place] [address]   Troponin I (High Sensitivity)     Status: Abnormal   Collection Time: 08/13/19  4:15 PM  Result Value Ref Range   Troponin I (High  Sensitivity) 58 (H) <18 ng/L    Comment: (NOTE) Elevated high sensitivity troponin I (hsTnI) values and significant  changes across serial measurements may suggest ACS but many other  chronic and acute conditions are known to elevate hsTnI results.  Refer to the "Links" section for chest pain algorithms and additional  guidance. Performed at [facility], [place] [address], [place], [place] [address]   CBC with Differential/Platelet     Status: Abnormal   Collection Time: 08/14/19 12:47 AM  Result Value Ref Range   WBC 11.2 (H) 4.0 - 10.5 K/uL   RBC 2.93 (L) 4.22 - 5.81 MIL/uL   Hemoglobin 9.1 (L) 13.0 - 17.0 g/dL   HCT 28.3 (L) 39 - 52 %   MCV 96.6 80.0 - 100.0 fL   MCH 31.1 26.0 - 34.0 pg   MCHC 32.2 30.0 - 36.0 g/dL   RDW 12.8 11.5 - 15.5 %   Platelets 257 150 - 400 K/uL   nRBC 0.0 0.0 - 0.2 %   Neutrophils Relative % 83 %   Neutro Abs 9.2 (H) 1.7 - 7.7 K/uL   Lymphocytes Relative 5 %   Lymphs Abs 0.6 (L) 0.7 - 4.0 K/uL   Monocytes Relative 10 %   Monocytes Absolute 1.2 (H) 0 - 1 K/uL   Eosinophils Relative 1 %   Eosinophils Absolute 0.1 0 - 0 K/uL   Basophils Relative 0 %   Basophils Absolute 0.0 0 - 0 K/uL   Immature Granulocytes 1 %   Abs Immature Granulocytes 0.15 (H) 0.00 - 0.07 K/uL    Comment: Performed at [facility], [place] [address], [place], [place] [address]  Basic metabolic panel     Status: Abnormal   Collection Time: 08/14/19 12:47 AM  Result Value Ref Range   Sodium 130 (L) 135 - 145 mmol/L   Potassium 4.2 3.5 - 5.1 mmol/L   Chloride 93 (L) 98 - 111 mmol/L   CO2 30 22 - 32 mmol/L   Glucose, Bld 92 70 - 99 mg/dL    Comment: Glucose reference range applies only to samples taken after fasting for at least 8 hours.   BUN 18 8 - 23 mg/dL   Creatinine, Ser 0.89 0.61 - 1.24 mg/dL   Calcium 8.5 (L) 8.9 - 10.3 mg/dL   GFR calc non Af Amer >60 >60 mL/min   GFR calc Af Amer >60 >60 mL/min   Anion gap 7 5 - 15    Comment: Performed at [facility] [address], [place], [place] [address]   *Note: Due to a large number of results and/or encounters for the requested time period, some results have not been displayed. A complete set of results can be found in Results Review.   No results found.  Medical Problem List and Plan: 1.  Impaired mobility  and ADLs secondary to right AKA  -patient may shower but residual limb must be covered  -ELOS/Goals: MinA PT, OT, I in SLP 2.  Antithrombotics: -DVT/anticoagulation:  Pharmaceutical: Heparin  -antiplatelet therapy: Aspirin 81mg  daily 3. Pain Management: Has residual limb pain--educate on desensitization techniques. Receiving Tylenol prn. Encouraged to ask for Norco prn if he needs.  4. Mood: Appears depressed--team to provide ego support. LCSW to follow for evaluation and support.   -antipsychotic agents: N/A 5. Neuropsych: This patient may be capable of making decisions on his own behalf. Intermittently disoriented.  6. Skin/Wound Care:  Monitor wound for healing.  7. Fluids/Electrolytes/Nutrition: Monitor I/O. Check lytes in am.  8.COPD/chronic hypoxic respiratory failure: On chronic steriods, Symbicort BID and Incruse, Oxygen dependent. Continue  9. BPH/renal calculi: Foley in place--has failed voiding trials x 2. Last changed 2 weeks ago. Will continue finasteride. Wife would like to repeat voiding trial in rehab.  10. Anxiety d/o: continue Xanax tid prn. 11. Hyponatremia: Recheck in am. Pre-renal azotemia has resolved.  12. ABLA: Recheck CBC in am. Leucocytosis resolving.  .13. Nasal congestion: Afrin nasal spray X 3 days. Will add saline spray as well as humidification.   14. HTN: Has history of orthostatic hypotension--no falls.  Monitor BP tid--continue lasix bid, aldactone 12.5/25 mgQOD and Toprol XL. Currently soft but is usually normotensive.  15. CAD/Chronic systolic CHF: Monitor for symptoms with increase in activity. On lasix bid, aldactone, toprol XL, ASA  and Lipitor   Reesa Chew, PA-C  I have personally performed a face to face diagnostic evaluation, including, but not limited to relevant history and physical exam findings, of this patient and developed relevant assessment and plan.  Additionally, I have reviewed and concur with the physician assistant's documentation above.  The patient's status has not changed. The original post admission physician evaluation remains appropriate, and any changes from the pre-admission screening or documentation from the acute chart are noted above.   Leeroy Cha, MD

## 2019-08-14 NOTE — Progress Notes (Signed)
Inpatient Rehab Admissions Coordinator:   Pt ready for admit to CIR per Dr. Doristine Bosworth.  Will let pt/family, and TOC team know.   Shann Medal, PT, DPT Admissions Coordinator [phone] 08/14/19  10:13 AM

## 2019-08-14 NOTE — Progress Notes (Signed)
Machine set up for pt, RN will connect oxygen to flowmeter when pt is ready to place himself on his home Trilogy. RT will continue to monitor.

## 2019-08-14 NOTE — Progress Notes (Addendum)
  Progress Note    08/14/2019 7:31 AM 3 Days Post-Op  Subjective:  Complaining of some pain in right AKA. States that it did not start hurting until this morning. Asking for some Tylenol   Vitals:   08/14/19 0400 08/14/19 0600  BP: (!) 146/80 123/63  Pulse: (!) 102 (!) 105  Resp: 13 12  Temp: 98.2 F (36.8 C)   SpO2: 99% 97%   Physical Exam: Cardiac:  tachycardic Lungs: non labored, on [place] Incisions: right above knee amputation site clean and dry, staples intact. There is some bruising of the medial aspect of the posterior flap of the AKA. No fluid collections or hematoma. Redressed with 4 x 4s and kerlix  Neurologic: Alert and oriented  CBC    Component Value Date/Time   WBC 11.2 (H) 08/14/2019 0047   RBC 2.93 (L) 08/14/2019 0047   HGB 9.1 (L) 08/14/2019 0047   HGB 12.4 (L) 06/25/2019 1449   HCT 28.3 (L) 08/14/2019 0047   HCT 36.6 (L) 06/25/2019 1449   PLT 257 08/14/2019 0047   PLT 208 06/25/2019 1449   MCV 96.6 08/14/2019 0047   MCV 96 06/25/2019 1449   MCH 31.1 08/14/2019 0047   MCHC 32.2 08/14/2019 0047   RDW 12.8 08/14/2019 0047   RDW 12.2 06/25/2019 1449   LYMPHSABS 0.6 (L) 08/14/2019 0047   LYMPHSABS 0.4 (L) 06/25/2019 1449   MONOABS 1.2 (H) 08/14/2019 0047   EOSABS 0.1 08/14/2019 0047   EOSABS 0.0 06/25/2019 1449   BASOSABS 0.0 08/14/2019 0047   BASOSABS 0.1 06/25/2019 1449    BMET    Component Value Date/Time   NA 130 (L) 08/14/2019 0047   NA 139 06/25/2019 1449   K 4.2 08/14/2019 0047   CL 93 (L) 08/14/2019 0047   CO2 30 08/14/2019 0047   GLUCOSE 92 08/14/2019 0047   BUN 18 08/14/2019 0047   BUN 29 (H) 06/25/2019 1449   CREATININE 0.89 08/14/2019 0047   CALCIUM 8.5 (L) 08/14/2019 0047   GFRNONAA >60 08/14/2019 0047   GFRAA >60 08/14/2019 0047    INR    Component Value Date/Time   INR 1.0 08/11/2019 0715     Intake/Output Summary (Last 24 hours) at 08/14/2019 0731 Last data filed at 08/14/2019 0518 Gross per 24 hour  Intake 480 ml   Output 1700 ml  Net -1220 ml     Assessment/Plan:  86 y.o. male is s/p right above knee amputation 3 Days Post-Op. Healing well. Pain seems to be well controlled. Continue daily dressing changes. Pending CIR  DVT prophylaxis: Heparin sq  Karoline Caldwell, [facility] [phone] 08/14/2019 7:31 AM   I have interviewed the patient and examined the patient. I agree with the findings by the PA.  Continue daily dressing changes.  Gae Gallop, MD [phone]

## 2019-08-14 NOTE — Progress Notes (Signed)
PMR Admission Coordinator Pre-Admission Assessment   Patient: [name] is an 86 y.o., male MRN: [id number] DOB: 1929/11/15 Height: '5\' 9"'$  (175.3 cm) Weight: 60.1 kg   Insurance Information HMO:     PPO:      PCP:      IPA:      80/20:      OTHER:  PRIMARY: Medicare A and B      Policy#: [id number]      Subscriber: pt CM Name:       Phone#:      Fax#:  Pre-Cert#: verified Civil engineer, contracting:  Benefits:  Phone #:      Name:  Eff. Date: A and B 07/09/96     Deduct: $1484      Out of Pocket Max: n/a      Life Max: n/a CIR: 100%      SNF: 20 full days Outpatient: 80%     Co-Pay: 20% Home Health: 100%      Co-Pay:  DME: 80%     Co-Pay: 20% Providers: pt choice   SECONDARY: [employer]      Policy#: [id number]     Phone#: [phone]   Financial Counselor:       Phone#:    The "Data Collection Information Summary" for patients in Inpatient Rehabilitation Facilities with attached "Privacy Act [place] Records" was provided and verbally reviewed with: Patient and Family   Emergency Contact Information         Contact Information     Name Relation Home Work Mobile    [place] Spouse     [phone]    Nop,Eric Pandora Leiter     [phone]    Adams,Alyssa Daughter     [phone]         Current Medical History  Patient Admitting Diagnosis: R AKA   History of Present Illness: Pt is an 86 y/o male with PMH of HTN, HLD, COPD (on 2L home O2), CAD s/p CABG, CDCHF, PVD, PAF, BPH, anxiety/depression, admitted to [facility] on 08/11/19 for R AKA per Dr. Scot Dock.  Post op course pain management.  Pt with bouts of chest pain on 8/3, treated with nitro, which caused hypotension.  Gentle hydration with IVF.     Patient's medical record from [facility] has been reviewed by the rehabilitation admission coordinator and physician.   Past Medical History      Past Medical History:  Diagnosis Date  . Anxiety    . Arthritis      "generalized" (04/04/2017)  . Asthma    . Basal cell  carcinoma      "left ear"  . BPH (benign prostatic hyperplasia)      severe; s/p multiple biopsies  . CAD (coronary artery disease)    . Carotid artery occlusion    . Chronic respiratory failure ([place])    . Chronic rhinitis    . COPD (chronic obstructive pulmonary disease) (HCC)      2L [place] O2  . Diastolic heart failure ([place]) 2019  . Dilation of biliary tract    . Elevated troponin 03/09/2017  . Gallstones    . GERD (gastroesophageal reflux disease)    . History of blood transfusion      "w/his CABG" (04/04/2017)  . History of kidney stones    . Hyperlipidemia    . Hypertension    . On home oxygen therapy      "~ 24/7" (04/04/2017)  . Peripheral vascular disease ([place])    .  Pneumonia 2019  . Squamous cell carcinoma of skin 04/23/2013    in situ-Right flank (txpbx)  . Squamous cell carcinoma of skin 01/23/2017    [place]/ in siut- Left ear rim (CX3FU)  . Syncope and collapse    . Ureteral tumor 08/2015    had endoscopic procedure for evaluation, unable to reach for biopsy  . Urinary catheter (Foley) change required        Family History   family history includes Allergies in his brother; Breast cancer in his mother; Cancer in his mother; Heart attack in his father; Heart disease in his brother, brother, father, and mother; Hyperlipidemia in his brother; Hypertension in his brother and mother; Other in his mother; Peripheral vascular disease in his brother; Stroke in his brother.   Prior Rehab/Hospitalizations Has the patient had prior rehab or hospitalizations prior to admission? Yes   Has the patient had major surgery during 100 days prior to admission? Yes              Current Medications   Current Facility-Administered Medications:  .  acetaminophen (TYLENOL) tablet 325-650 mg, 325-650 mg, Oral, Q4H PRN, 650 mg at 08/13/19 0829 **OR** acetaminophen (TYLENOL) suppository 325-650 mg, 325-650 mg, Rectal, Q4H PRN, Baglia, Corrina, PA-C .  ALPRAZolam Duanne Moron) tablet 0.125-0.25 mg,  0.125-0.25 mg, Oral, TID PRN, Baglia, Corrina, PA-C, 0.25 mg at 08/12/19 2232 .  alum & mag hydroxide-simeth (MAALOX/MYLANTA) 200-200-20 MG/5ML suspension 15-30 mL, 15-30 mL, Oral, Q2H PRN, Baglia, Corrina, PA-C .  aspirin EC tablet 81 mg, 81 mg, Oral, Daily, Baglia, Corrina, PA-C, 81 mg at 08/13/19 9485 .  atorvastatin (LIPITOR) tablet 20 mg, 20 mg, Oral, QHS, Baglia, Corrina, PA-C, 20 mg at 08/12/19 2232 .  azithromycin (ZITHROMAX) tablet 250 mg, 250 mg, Oral, Daily, Baglia, Corrina, PA-C, 250 mg at 08/13/19 0823 .  budesonide-formoterol (SYMBICORT) 160-4.5 MCG/ACT inhaler 2 puff, 2 puff, Inhalation, BID, Fuller Plan A, MD, 2 puff at 08/13/19 0802 .  Chlorhexidine Gluconate Cloth 2 % PADS 6 each, 6 each, Topical, q morning - 10a, Norval Morton, MD, 6 each at 08/13/19 0825 .  cycloSPORINE (RESTASIS) 0.05 % ophthalmic emulsion 1 drop, 1 drop, Both Eyes, BID, Smith, Rondell A, MD, 1 drop at 08/13/19 0823 .  docusate sodium (COLACE) capsule 100 mg, 100 mg, Oral, Daily, Baglia, Corrina, PA-C, 100 mg at 08/13/19 0821 .  famotidine (PEPCID) tablet 20 mg, 20 mg, Oral, QHS, Baglia, Corrina, PA-C, 20 mg at 08/12/19 2232 .  finasteride (PROSCAR) tablet 5 mg, 5 mg, Oral, Q lunch, Baglia, Corrina, PA-C, 5 mg at 08/12/19 1233 .  fluticasone (FLONASE) 50 MCG/ACT nasal spray 2 spray, 2 spray, Each Nare, Daily, Baglia, Corrina, PA-C, 2 spray at 08/12/19 0819 .  furosemide (LASIX) tablet 20 mg, 20 mg, Oral, BID, Baglia, Corrina, PA-C, 20 mg at 08/13/19 4627 .  guaiFENesin-dextromethorphan (ROBITUSSIN DM) 100-10 MG/5ML syrup 15 mL, 15 mL, Oral, Q4H PRN, Baglia, Corrina, PA-C .  heparin injection 5,000 Units, 5,000 Units, Subcutaneous, Q8H, Baglia, Corrina, PA-C, 5,000 Units at 08/13/19 0629 .  hydrALAZINE (APRESOLINE) injection 5 mg, 5 mg, Intravenous, Q20 Min PRN, Baglia, Corrina, PA-C .  HYDROcodone-acetaminophen (NORCO/VICODIN) 5-325 MG per tablet 1-2 tablet, 1-2 tablet, Oral, Q4H PRN, Starla Link, Kshitiz, MD .   labetalol (NORMODYNE) injection 10 mg, 10 mg, Intravenous, Q10 min PRN, Baglia, Corrina, PA-C .  levalbuterol (XOPENEX) nebulizer solution 0.63 mg, 0.63 mg, Nebulization, Q6H PRN, Smith, Rondell A, MD .  magnesium sulfate IVPB 2 g 50 mL, 2  g, Intravenous, Daily PRN, Baglia, Corrina, PA-C .  metoprolol succinate (TOPROL-XL) 24 hr tablet 12.5 mg, 12.5 mg, Oral, Daily, Baglia, Corrina, PA-C, 12.5 mg at 08/13/19 0821 .  metoprolol tartrate (LOPRESSOR) injection 2-5 mg, 2-5 mg, Intravenous, Q2H PRN, Baglia, Corrina, PA-C .  nitroGLYCERIN (NITROSTAT) SL tablet 0.4 mg, 0.4 mg, Sublingual, Q5 min PRN, Baglia, Corrina, PA-C, 0.4 mg at 08/13/19 0947 .  ondansetron (ZOFRAN) injection 4 mg, 4 mg, Intravenous, Q6H PRN, Baglia, Corrina, PA-C .  pantoprazole (PROTONIX) EC tablet 40 mg, 40 mg, Oral, Daily, Baglia, Corrina, PA-C, 40 mg at 08/13/19 0821 .  phenol (CHLORASEPTIC) mouth spray 1 spray, 1 spray, Mouth/Throat, PRN, Baglia, Corrina, PA-C .  polyethylene glycol (MIRALAX / GLYCOLAX) packet 17 g, 17 g, Oral, Daily, Pahwani, Ravi, MD .  potassium chloride SA (KLOR-CON) CR tablet 20-40 mEq, 20-40 mEq, Oral, Daily PRN, Baglia, Corrina, PA-C .  predniSONE (DELTASONE) tablet 10 mg, 10 mg, Oral, q AM, Baglia, Corrina, PA-C, 10 mg at 08/12/19 8721 .  senna-docusate (Senokot-S) tablet 1 tablet, 1 tablet, Oral, QHS PRN, Baglia, Corrina, PA-C .  sodium chloride (MURO 128) 5 % ophthalmic solution 1 drop, 1 drop, Both Eyes, QID, Smith, Rondell A, MD, 1 drop at 08/13/19 0824 .  spironolactone (ALDACTONE) tablet 12.5 mg, 12.5 mg, Oral, QODAY, Smith, Rondell A, MD, 12.5 mg at 08/13/19 0136 .  spironolactone (ALDACTONE) tablet 25 mg, 25 mg, Oral, QODAY, Smith, Rondell A, MD .  umeclidinium bromide (INCRUSE ELLIPTA) 62.5 MCG/INH 1 puff, 1 puff, Inhalation, Daily, Madelyn Flavors A, MD, 1 puff at 08/12/19 0847   Patients Current Diet:     Diet Order                      Diet Heart Room service appropriate? Yes; Fluid  consistency: Thin; Fluid restriction: 1500 mL Fluid  Diet effective now                      Precautions / Restrictions Precautions Precautions: Fall Precaution Comments: Rt AKA Restrictions Weight Bearing Restrictions: Yes RLE Weight Bearing: Touchdown weight bearing    Has the patient had 2 or more falls or a fall with injury in the past year? Yes   Prior Activity Level Limited Community (1-2x/wk): Has been having more difficulty with mobility and ADLs 3 months PTA, using a cane or RW for ambulation.   Prior Functional Level Self Care: Did the patient need help bathing, dressing, using the toilet or eating? Needed some help   Indoor Mobility: Did the patient need assistance with walking from room to room (with or without device)? Independent   Stairs: Did the patient need assistance with internal or external stairs (with or without device)? Needed some help   Functional Cognition: Did the patient need help planning regular tasks such as shopping or remembering to take medications? Independent   Home Assistive Devices / Equipment Home Assistive Devices/Equipment: Other (Comment) (oxygen) Home Equipment: Walker - 2 wheels, Cane - single point, [employer], Transport chair   Prior Device Use: Indicate devices/aids used by the patient prior to current illness, exacerbation or injury? Walker and cane   Current Functional Level Cognition   Overall Cognitive Status: Within Functional Limits for tasks assessed Orientation Level: Oriented X4 Following Commands: Follows one step commands with increased time General Comments: per wife pt is very detail oriented and specific and tends to take a long time to accomplish tasks    Extremity Assessment (includes  Sensation/Coordination)   Upper Extremity Assessment: Generalized weakness  Lower Extremity Assessment: Defer to PT evaluation RLE Deficits / Details: AKA with spasms in hip flexors     ADLs   Overall ADL's : Needs  assistance/impaired Eating/Feeding: Minimal assistance, Bed level Eating/Feeding Details (indicate cue type and reason): eating lunch on arrival. pt with setup by wife for self feeding.  Grooming: Wash/dry face, Set up, Bed level General ADL Comments: pt eating lunch and education on OT services and OT to return to complete session. RN pre medicated patient as requested. pt was feeling fatigued but agreeable to some bed level task. Pt educated on residual limb tactile input to help with phatom pains. Pt demonstrates hip abduction/ adduction exercise. L LE elevated with pillows activation toes ankle and knee this session in attempt to help with edema management. Pt and wife educated to have patient rotate on his side in the next 2 hours to give pressure relief to buttock.       Mobility   Overal bed mobility: Needs Assistance Bed Mobility: Rolling Rolling: Min assist Supine to sit: HOB elevated, Min assist General bed mobility comments: declines oob or eob at this time due to fatigue. pt reports i really am just so tired from the day.      Transfers   Overall transfer level: Needs assistance Transfers: Sit to/from Stand Sit to Stand: Min assist General transfer comment: declined will attempt next session     Ambulation / Gait / Stairs / Wheelchair Mobility   Ambulation/Gait General Gait Details: not yet able     Posture / Balance Balance Overall balance assessment: Needs assistance Sitting-balance support: Feet supported, No upper extremity supported Sitting balance-Leahy Scale: Fair Standing balance support: Bilateral upper extremity supported Standing balance-Leahy Scale: Poor     Special needs/care consideration BiPAP, Special service needs hard of hearing and Designated visitor spouse Gerrard (from acute therapy documentation) Living Arrangements: Spouse/significant other Available Help at Discharge: Family, Available 24 hours/day Type of Home:  House Home Layout: Multi-level, Bed/bath upstairs Alternate Level Stairs-Number of Steps: 9 to kitchen/ living, 6 additional to bedroom Home Access: Level entry Bathroom Shower/Tub: Multimedia programmer: [place]: Yes Type of Home Care Services: Home OT, [place] (if known): Advanced   Discharge Living Setting Plans for Discharge Living Setting: Patient's home Type of Home at Discharge: House Discharge Home Layout: Multi-level, Able to live on main level with bedroom/bathroom (split level) Alternate Level Stairs-Number of Steps: 9 to kitchen/living, +6 to bedroom Discharge Home Access: Stairs to enter, Level entry Entrance Stairs-Rails: None Entrance Stairs-Number of Steps: 5 Discharge Bathroom Shower/Tub: Walk-in shower Discharge Bathroom Toilet: Standard Discharge Bathroom Accessibility: Yes How Accessible: Accessible via walker Does the patient have any problems obtaining your medications?: No   Social/Family/Support Systems Patient Roles: Spouse Anticipated Caregiver: wife, [name], and children Randall Hiss and [place]) Anticipated Caregiver's Contact Information: [name] [phone] Ability/Limitations of Caregiver: min assist Caregiver Availability: 24/7 Discharge Plan Discussed with Primary Caregiver: Yes Is Caregiver In Agreement with Plan?: Yes Does Caregiver/Family have Issues with Lodging/Transportation while Pt is in Rehab?: No   Goals Patient/Family Goal for Rehab: PT/OT supervision with LRAD (short distance amb, w/c longer distance) Expected length of stay: 15-19 days Additional Information: Pt has a level entry to lowest floor of house where they can set up a bed, and there is a bathroom.  Also have discussed possibility of stair lift  in the house for longer term access Pt/Family Agrees to Admission and willing to participate: Yes Program Orientation Provided & Reviewed with  Pt/Caregiver Including Roles  & Responsibilities: Yes   Decrease burden of Care through IP rehab admission: n/a   Possible need for SNF placement upon discharge: Not anticipated   Patient Condition: I have reviewed medical records from [facility], spoken with CM, and patient and spouse. I met with patient at the bedside for inpatient rehabilitation assessment.  Patient will benefit from ongoing PT and OT, can actively participate in 3 hours of therapy a day 5 days of the week, and can make measurable gains during the admission.  Patient will also benefit from the coordinated team approach during an Inpatient Acute Rehabilitation admission.  The patient will receive intensive therapy as well as Rehabilitation physician, nursing, social worker, and care management interventions.  Due to bladder management, bowel management, safety, skin/wound care, disease management, medication administration, pain management and patient education the patient requires 24 hour a day rehabilitation nursing.  The patient is currently min assist with mobility and basic ADLs.  Discharge setting and therapy post discharge at home with home health is anticipated.  Patient has agreed to participate in the Acute Inpatient Rehabilitation Program and will admit today.   Preadmission Screen Completed By:  Michel Santee, PT, DPT 08/13/2019 11:18 AM ______________________________________________________________________   Discussed status with Dr. Ranell Patrick on 08/13/19  at 11:24 AM  and received approval for admission today.   Admission Coordinator:  Michel Santee, PT, DPT time 11:24 AM Sudie Grumbling 08/13/19     Assessment/Plan: Diagnosis: Right AKA 1. Does the need for close, 24 hr/day Medical supervision in concert with the patient's rehab needs make it unreasonable for this patient to be served in a less intensive setting? Yes 2. Co-Morbidities requiring supervision/potential complications: Severe PAD, HLD, BPH, Anxiety,  chronic respiratory failure with hypoxia and hypercapnia 3. Due to bladder management, bowel management, safety, skin/wound care, disease management, medication administration, pain management and patient education, does the patient require 24 hr/day rehab nursing? Yes 4. Does the patient require coordinated care of a physician, rehab nurse, PT, OT to address physical and functional deficits in the context of the above medical diagnosis(es)? Yes Addressing deficits in the following areas: balance, endurance, locomotion, strength, transferring, bowel/bladder control, bathing, dressing, feeding, grooming, toileting and psychosocial support 5. Can the patient actively participate in an intensive therapy program of at least 3 hrs of therapy 5 days a week? Yes 6. The potential for patient to make measurable gains while on inpatient rehab is good 7. Anticipated functional outcomes upon discharge from inpatient rehab: min assist PT, min assist OT, independent SLP 8. Estimated rehab length of stay to reach the above functional goals is: 20-22 days 9. Anticipated discharge destination: Home 10. Overall Rehab/Functional Prognosis: good     MD Signature: Leeroy Cha, MD

## 2019-08-14 NOTE — Care Management Important Message (Signed)
Important Message  Patient Details  Name: [name] MRN: [id number] Date of Birth: November 23, 1929   Medicare Important Message Given:  Yes     [name] 08/14/2019, 9:03 AM

## 2019-08-14 NOTE — Progress Notes (Signed)
Physical Therapy Treatment Patient Details Name: BRIEN LOWE MRN: [id number] DOB: 27-May-1929 Today's Date: 08/14/2019    History of Present Illness 86 yo admitted with RLE pain with arterial occlusion without option for revascularization s/p Rt AKA. PMHx: COPD on home 2L, CAD s/p CABG, CHF, PVD, PAF, HTN, HLD, BPH, depression/anxiety, HOH    PT Comments    On arrival to room pt with c/o pain all over, initially declining therapy but agreeable to assistance repositioning. Pt sat EOB to take his breathing treatment during this time BP slowly decreased to 86/53. Pt symptomatic with c/o dizziness and feeling "unable to think". Returned to bed and assisted to side laying to take pressure off pt's buttocks, as he reports this area is "burning". Wife present during treatment and given cope of HEP, however exercises were not performed as pt was feeling poorly.  Will continue to follow acutely.    Follow Up Recommendations  CIR;Supervision/Assistance - 24 hour     Equipment Recommendations  Wheelchair (measurements PT);Wheelchair cushion (measurements PT);3in1 (PT)    Recommendations for Other Services       Precautions / Restrictions Precautions Precautions: Fall Precaution Comments: Rt AKA Restrictions Weight Bearing Restrictions: Yes RLE Weight Bearing: Non weight bearing    Mobility  Bed Mobility Overal bed mobility: Needs Assistance Bed Mobility: Rolling;Supine to Sit;Sit to Supine Rolling: Min assist   Supine to sit: HOB elevated;Min assist Sit to supine: Min assist;HOB elevated   General bed mobility comments: Min a to elevate trunk. Pt able to progress LEs with cueing. Rolled to sidelaying at end of session.  Transfers                 General transfer comment: Unable secondary to hypotension  Ambulation/Gait             General Gait Details: not yet able   Stairs             Wheelchair Mobility    Modified Rankin (Stroke Patients Only)        Balance Overall balance assessment: Needs assistance Sitting-balance support: Feet supported;No upper extremity supported Sitting balance-Leahy Scale: Fair     Standing balance support: Bilateral upper extremity supported Standing balance-Leahy Scale: Poor                              Cognition Arousal/Alertness: Lethargic (Reports only taking tylenol) Behavior During Therapy: WFL for tasks assessed/performed Overall Cognitive Status: Within Functional Limits for tasks assessed Area of Impairment:  (HOH)                       Following Commands: Follows one step commands with increased time     Problem Solving: Slow processing General Comments: Pt is HOH and requires increased time to follow commands.       Exercises      General Comments        Pertinent Vitals/Pain Pain Assessment: Faces Faces Pain Scale: Hurts even more Pain Location: generalized  Pain Descriptors / Indicators: Discomfort Pain Intervention(s): Monitored during session;Limited activity within patient's tolerance;Repositioned    Home Living                      Prior Function            PT Goals (current goals can now be found in the care plan section) Acute Rehab PT Goals Patient Stated Goal: be  able to return home PT Goal Formulation: With patient/family Time For Goal Achievement: 08/26/19 Potential to Achieve Goals: Good Progress towards PT goals: Progressing toward goals    Frequency    Min 3X/week      PT Plan Current plan remains appropriate    Co-evaluation              AM-PAC PT "6 Clicks" Mobility   Outcome Measure  Help needed turning from your back to your side while in a flat bed without using bedrails?: A Little Help needed moving from lying on your back to sitting on the side of a flat bed without using bedrails?: A Little Help needed moving to and from a bed to a chair (including a wheelchair)?: A Lot Help needed standing up  from a chair using your arms (e.g., wheelchair or bedside chair)?: A Little Help needed to walk in hospital room?: Total Help needed climbing 3-5 steps with a railing? : Total 6 Click Score: 13    End of Session Equipment Utilized During Treatment: Oxygen Activity Tolerance: Patient tolerated treatment well Patient left: in bed;with call bell/phone within reach;with family/visitor present Nurse Communication: Mobility status;Other (comment) (hypotension) PT Visit Diagnosis: Other abnormalities of gait and mobility (R26.89)     Time: 7262-0355 PT Time Calculation (min) (ACUTE ONLY): 24 min  Charges:  $Therapeutic Activity: 23-37 mins                     Benjiman Core, [place] Pager [phone] Acute Rehab  Allena Katz 08/14/2019, 11:45 AM

## 2019-08-14 NOTE — Discharge Instructions (Signed)
Leg Amputation        Leg amputation is a procedure to remove the leg. You may have this procedure if your leg has been affected by a disease, such as peripheral artery disease or severe circulation disease, and is causing problems. There are three types of leg amputation:  Above-the-knee amputation (transfemoral amputation). This type is done to remove the leg from above the knee.  Below-the-knee amputation (transtibial amputation). This type is done to remove the leg from below the knee.  Through-knee amputation. This type is done to remove the leg at the knee joint. Tell a health care provider about:  Any allergies you have.  All medicines you are taking, including vitamins, herbs, eye drops, creams, and over-the-counter medicines.  Any problems you or family members have had with anesthetic medicines.  Any blood disorders you have.  Any surgeries you have had.  Any medical conditions you have.  Whether you are pregnant or may be pregnant. What are the risks? Generally, this is a safe procedure. However, problems may occur, including:  Infection.  Bleeding.  Allergic reactions to medicines.  Stiffening of the hip and knee joint (hip and knee contracture).  Blood clot in a deep vein (deep vein thrombosis, or DVT), usually in the leg.  A blood clot in your lung (pulmonary embolism). What happens before the procedure? Staying hydrated Follow instructions from your health care provider about hydration, which may include:  Up to 2 hours before the procedure - you may continue to drink clear liquids, such as water, clear fruit juice, black coffee, and plain tea. Eating and drinking restrictions Follow instructions from your health care provider about eating and drinking, which may include:  8 hours before the procedure - stop eating heavy meals or foods such as meat, fried foods, or fatty foods.  6 hours before the procedure - stop eating light meals or foods, such as  toast or cereal.  6 hours before the procedure - stop drinking milk or drinks that contain milk.  2 hours before the procedure - stop drinking clear liquids. Medicines  Ask your health care provider about: ? Changing or stopping your regular medicines. This is especially important if you are taking diabetes medicines or blood thinners. ? Taking over-the-counter medicines, vitamins, herbs, and supplements. ? Taking medicines such as aspirin and ibuprofen. These medicines can thin your blood. Do not take these medicines unless your health care provider tells you to take them.  You may be given antibiotic medicine to help prevent an infection. If so, take the antibiotic as told by your health care provider. General instructions  Ask your health care provider how your surgical site will be marked or identified.  Plan to have someone take you home from the hospital or clinic.  Plan to have a responsible adult care for you for at least 24 hours after you leave the hospital or clinic. This is important. What happens during the procedure?  To lower your risk of infection: ? Your health care team will wash or sanitize their hands. ? Hair may be removed from the surgical area. ? Your skin will be washed with soap.  An IV will be inserted into one of your veins.  You will be given one or more of the following: ? A medicine to help you relax (sedative). ? A medicine to numb the area (local anesthetic). ? A medicine to make you fall asleep (general anesthetic). ? A medicine that is injected into your spine to  numb the area below and slightly above the injection site (spinal anesthetic). ? A medicine that is injected into an area of your body to numb everything below the injection site (regional anesthetic).  Damaged or diseased tissue and bone will be removed.  Uneven areas of bone will be smoothed.  Blood vessels and nerves will be closed off.  Muscles will be cut and reshaped so that  an artificial leg (prosthesis) can be attached to the stump.  The incision will be closed with stitches (sutures).  A bandage (dressing) will be placed over the incision. The procedure may vary among health care providers and hospitals. What happens after the procedure?  Your blood pressure, heart rate, breathing rate, and blood oxygen level will be monitored until the medicines you were given have worn off.  You will be given medicine for pain. The medicine may include: ? A sedative. ? A spinal anesthetic. ? A regional anesthetic.  You may start physical therapy within 24 hours after your surgery. Summary  Leg amputation is a procedure to remove the leg from above, below, or through the knee.  Follow instructions from your health care provider about eating or drinking restrictions before the procedure.  Before the procedure, ask your health care provider how your surgical site will be marked or identified. This information is not intended to replace advice given to you by your health care provider. Make sure you discuss any questions you have with your health care provider. Document Revised: 09/30/2018 Document Reviewed: 04/05/2016 Elsevier Patient Education  [name].

## 2019-08-15 ENCOUNTER — Inpatient Hospital Stay (HOSPITAL_COMMUNITY): Payer: Medicare Other | Admitting: Occupational Therapy

## 2019-08-15 ENCOUNTER — Inpatient Hospital Stay (HOSPITAL_COMMUNITY): Payer: Medicare Other

## 2019-08-15 DIAGNOSIS — E871 Hypo-osmolality and hyponatremia: Secondary | ICD-10-CM

## 2019-08-15 DIAGNOSIS — E8809 Other disorders of plasma-protein metabolism, not elsewhere classified: Secondary | ICD-10-CM

## 2019-08-15 DIAGNOSIS — R338 Other retention of urine: Secondary | ICD-10-CM

## 2019-08-15 DIAGNOSIS — E46 Unspecified protein-calorie malnutrition: Secondary | ICD-10-CM

## 2019-08-15 DIAGNOSIS — Z9981 Dependence on supplemental oxygen: Secondary | ICD-10-CM

## 2019-08-15 DIAGNOSIS — I10 Essential (primary) hypertension: Secondary | ICD-10-CM

## 2019-08-15 DIAGNOSIS — N401 Enlarged prostate with lower urinary tract symptoms: Secondary | ICD-10-CM

## 2019-08-15 DIAGNOSIS — R7401 Elevation of levels of liver transaminase levels: Secondary | ICD-10-CM

## 2019-08-15 DIAGNOSIS — D62 Acute posthemorrhagic anemia: Secondary | ICD-10-CM

## 2019-08-15 DIAGNOSIS — G8918 Other acute postprocedural pain: Secondary | ICD-10-CM

## 2019-08-15 LAB — COMPREHENSIVE METABOLIC PANEL
ALT: 48 U/L — ABNORMAL HIGH (ref 0–44)
AST: 48 U/L — ABNORMAL HIGH (ref 15–41)
Albumin: 2.4 g/dL — ABNORMAL LOW (ref 3.5–5.0)
Alkaline Phosphatase: 85 U/L (ref 38–126)
Anion gap: 9 (ref 5–15)
BUN: 18 mg/dL (ref 8–23)
CO2: 30 mmol/L (ref 22–32)
Calcium: 8.6 mg/dL — ABNORMAL LOW (ref 8.9–10.3)
Chloride: 90 mmol/L — ABNORMAL LOW (ref 98–111)
Creatinine, Ser: 0.99 mg/dL (ref 0.61–1.24)
GFR calc Af Amer: >60 mL/min (ref >60)
GFR calc non Af Amer: >60 mL/min (ref >60)
Glucose, Bld: 93 mg/dL (ref 70–99)
Potassium: 4.1 mmol/L (ref 3.5–5.1)
Sodium: 129 mmol/L — ABNORMAL LOW (ref 135–145)
Total Bilirubin: 0.7 mg/dL (ref 0.3–1.2)
Total Protein: 4.9 g/dL — ABNORMAL LOW (ref 6.5–8.1)

## 2019-08-15 LAB — CBC WITH DIFFERENTIAL/PLATELET
Abs Immature Granulocytes: 0.14 10*3/uL — ABNORMAL HIGH (ref 0.00–0.07)
Basophils Absolute: 0 10*3/uL (ref 0.0–0.1)
Basophils Relative: 0 %
Eosinophils Absolute: 0.1 10*3/uL (ref 0.0–0.5)
Eosinophils Relative: 1 %
HCT: 27.2 % — ABNORMAL LOW (ref 39.0–52.0)
Hemoglobin: 8.8 g/dL — ABNORMAL LOW (ref 13.0–17.0)
Immature Granulocytes: 2 %
Lymphocytes Relative: 6 %
Lymphs Abs: 0.5 10*3/uL — ABNORMAL LOW (ref 0.7–4.0)
MCH: 31.2 pg (ref 26.0–34.0)
MCHC: 32.4 g/dL (ref 30.0–36.0)
MCV: 96.5 fL (ref 80.0–100.0)
Monocytes Absolute: 1 10*3/uL (ref 0.1–1.0)
Monocytes Relative: 12 %
Neutro Abs: 6.9 10*3/uL (ref 1.7–7.7)
Neutrophils Relative %: 79 %
Platelets: 279 10*3/uL (ref 150–400)
RBC: 2.82 MIL/uL — ABNORMAL LOW (ref 4.22–5.81)
RDW: 12.7 % (ref 11.5–15.5)
WBC: 8.6 10*3/uL (ref 4.0–10.5)
nRBC: 0 % (ref 0.0–0.2)

## 2019-08-15 MED ORDER — TIOTROPIUM BROMIDE MONOHYDRATE 2.5 MCG/ACT IN AERS
2.0000 | INHALATION_SPRAY | Freq: Every day | RESPIRATORY_TRACT | Status: DC
  Administered 2019-08-16 – 2019-08-19 (×4): 2 via RESPIRATORY_TRACT

## 2019-08-15 MED ORDER — ADULT MULTIVITAMIN W/MINERALS CH
1.0000 | ORAL_TABLET | Freq: Every day | ORAL | Status: DC
  Administered 2019-08-16 – 2019-08-20 (×5): 1 via ORAL
  Filled 2019-08-15 (×5): qty 1

## 2019-08-15 MED ORDER — PROSOURCE PLUS PO LIQD
30.0000 mL | Freq: Two times a day (BID) | ORAL | Status: DC
  Administered 2019-08-15 – 2019-08-19 (×9): 30 mL via ORAL
  Filled 2019-08-15 (×9): qty 30

## 2019-08-15 NOTE — Evaluation (Signed)
Physical Therapy Assessment and Plan  Patient Details  Name: [name] MRN: [id number] Date of Birth: 03-26-1929  PT Diagnosis: Abnormal posture, Difficulty walking, Impaired sensation and Muscle weakness Rehab Potential: Good ELOS: 14-18 days   Today's Date: 08/15/2019 PT Individual Time: 1000-1110 PT Individual Time Calculation (min): 70 min    Hospital Problem: Principal Problem:   S/P AKA (above knee amputation) ([place]) Active Problems:   Hypoalbuminemia due to protein-calorie malnutrition ([place])   Transaminitis   Acute blood loss anemia   Hyponatremia   Postoperative pain   Supplemental oxygen dependent   Past Medical History:  Past Medical History:  Diagnosis Date  . Anxiety   . Arthritis    "generalized" (04/04/2017)  . Asthma   . Basal cell carcinoma    "left ear"  . BPH (benign prostatic hyperplasia)    severe; s/p multiple biopsies  . CAD (coronary artery disease)   . Carotid artery occlusion   . Chronic respiratory failure ([place])   . Chronic rhinitis   . COPD (chronic obstructive pulmonary disease) ([facility])    2L [place] O2  . Diastolic heart failure ([place]) 2019  . Dilation of biliary tract   . Elevated troponin 03/09/2017  . Gallstones   . GERD (gastroesophageal reflux disease)   . History of blood transfusion    "w/his CABG" (04/04/2017)  . History of kidney stones   . Hyperlipidemia   . Hypertension   . On home oxygen therapy    "~ 24/7" (04/04/2017)  . Peripheral vascular disease ([place])   . Pneumonia 2019  . Squamous cell carcinoma of skin 04/23/2013   in situ-Right flank (txpbx)  . Squamous cell carcinoma of skin 01/23/2017   [place]/ in siut- Left ear rim (CX3FU)  . Syncope and collapse   . Ureteral tumor 08/2015   had endoscopic procedure for evaluation, unable to reach for biopsy  . Urinary catheter (Foley) change required    Past Surgical History:  Past Surgical History:  Procedure Laterality Date  . ABDOMINAL AORTOGRAM W/LOWER EXTREMITY Bilateral  06/30/2019   Procedure: ABDOMINAL AORTOGRAM W/LOWER EXTREMITY;  Surgeon: Waynetta Sandy, MD;  Location: [facility];  Service: Cardiovascular;  Laterality: Bilateral;  . AMPUTATION Right 08/11/2019   Procedure: RIGHT ABOVE KNEE AMPUTATION;  Surgeon: Angelia Mould, MD;  Location: [facility];  Service: Vascular;  Laterality: Right;  . BASAL CELL CARCINOMA EXCISION Left    ear  . CARDIAC CATHETERIZATION     "prior to bypass"  . CATARACT EXTRACTION W/ INTRAOCULAR LENS  IMPLANT, BILATERAL Bilateral   . CORONARY ARTERY BYPASS GRAFT  10/2001   LIMA to LAD, SVG to OM1-2, SVG to RCA and PDA  . LITHOTRIPSY    . PROSTATE BIOPSY  Oct. 2014  . TONSILLECTOMY      Assessment & Plan Clinical Impression: Patient is a 86 y.o. year old male with PMH of HTN, HLD, COPD (on 2L home O2), CAD s/p CABG, CDCHF, PVD, PAF, BPH, anxiety/depression, admitted to [facility] on 08/11/19 for R AKA per Dr. Scot Dock.  Post op course pain management.  Pt with bouts of chest pain on 8/3, treated with nitro, which caused hypotension.  Gentle hydration with IVF.    Patient currently requires mod with mobility secondary to muscle weakness, decreased cardiorespiratoy endurance and decreased oxygen support and decreased sitting balance, decreased standing balance, decreased postural control, decreased balance strategies and difficulty maintaining precautions.  Prior to hospitalization, patient was modified independent  with mobility and lived with  Spouse in a House home.  Home access is  Level entry.  Patient will benefit from skilled PT intervention to maximize safe functional mobility, minimize fall risk and decrease caregiver burden for planned discharge home with 24 hour assist.  Anticipate patient will benefit from follow up [facility] at discharge.  PT - End of Session Activity Tolerance: Tolerates 30+ min activity with multiple rests Endurance Deficit: Yes Endurance Deficit Description: pt required multiple extended rest  breaks during session PT Assessment Rehab Potential (ACUTE/IP ONLY): Good PT Barriers to Discharge: [place] home environment;Home environment access/layout;Wound Care;Weight bearing restrictions PT Barriers to Discharge Comments: multiple sets of steps inside home with 1 rail PT Patient demonstrates impairments in the following area(s): Balance;Endurance;Motor;Pain;Perception;Sensory;Skin Integrity PT Transfers Functional Problem(s): Bed Mobility;Bed to Chair;Car;Furniture PT Locomotion Functional Problem(s): Ambulation;Wheelchair Mobility;Stairs PT Plan PT Intensity: Minimum of 1-2 x/day ,45 to 90 minutes PT Frequency: 5 out of 7 days PT Duration Estimated Length of Stay: 14-18 days PT Treatment/Interventions: Ambulation/gait training;Discharge planning;Functional mobility training;Psychosocial support;Therapeutic Activities;Balance/vestibular training;Disease management/prevention;Neuromuscular re-education;Skin care/wound management;Therapeutic Exercise;Wheelchair propulsion/positioning;DME/adaptive equipment instruction;Pain management;Splinting/orthotics;UE/LE Strength taining/ROM;Community reintegration;Functional electrical stimulation;Patient/family education;Stair training;UE/LE Coordination activities PT Transfers Anticipated Outcome(s): supervision with LRAD PT Locomotion Anticipated Outcome(s): NA PT Recommendation Follow Up Recommendations: Home health PT Patient destination: Home Equipment Recommended: To be determined Equipment Details: has RW and SPC   PT Evaluation Precautions/Restrictions Restrictions Weight Bearing Restrictions: Yes RLE Weight Bearing: Non weight bearing Home Living/Prior Functioning Home Living Available Help at Discharge: Family;Available 24 hours/day Type of Home: House Home Access: Level entry Home Layout: Multi-level;Bed/bath upstairs Alternate Level Stairs-Number of Steps: Enters from carport with level entry. 9 to kitchen/dining  room/living room. 6 additional steps to bedroom/bathroom with 3 bedrooms and 2 bathrooms with 1 rail with each set of stairs. Alternate Level Stairs-Rails: Right Bathroom Shower/Tub: Walk-in shower;Tub/shower unit Bathroom Toilet: Standard Bathroom Accessibility: Yes Additional Comments: O2 2L  Lives With: Spouse Prior Function Level of Independence: Independent with basic ADLs;Independent with homemaking with ambulation;Requires assistive device for independence  Able to Take Stairs?: Yes Driving: Yes Vocation: Retired Associate Professor Overall Cognitive Status: Within Functional Limits for tasks assessed Arousal/Alertness: Awake/alert Orientation Level: Oriented X4 Memory: Appears intact Awareness: Appears intact Problem Solving: Appears intact Safety/Judgment: Appears intact Sensation Sensation Light Touch: Impaired by gross assessment Proprioception: Impaired by gross assessment Additional Comments: decreased along incision and at L2 dermatome on R residual limb. Decreased sensation along L great toe. Coordination Gross Motor Movements are Fluid and Coordinated: No Fine Motor Movements are Fluid and Coordinated: Yes Coordination and Movement Description: grossly uncoordinated due to R AKA, decreased balance/postural control, generalized weakness, poor endurance, and fatigue Finger Nose Finger Test: [facility] but slow Heel Shin Test: decreased on L LE unable to perform on R LE due to AKA Motor  Motor Motor: Abnormal postural alignment and control Motor - Skilled Clinical Observations: grossly uncoordinated due to R AKA, decreased balance/postural control, generalized weakness, poor endurance, and fatigue  Trunk/Postural Assessment  Cervical Assessment Cervical Assessment: Exceptions to [facility] (forward head) Thoracic Assessment Thoracic Assessment: Exceptions to [facility] (kyphosis) Lumbar Assessment Lumbar Assessment: Exceptions to [facility] (posterior pelvic tilt) Postural Control Postural Control:  Deficits on evaluation  Balance Balance Balance Assessed: Yes Static Sitting Balance Static Sitting - Balance Support: Bilateral upper extremity supported;Feet supported Static Sitting - Level of Assistance: 5: Stand by assistance (supervision) Dynamic Sitting Balance Dynamic Sitting - Balance Support: Bilateral upper extremity supported;Feet unsupported Dynamic Sitting - Level of Assistance: 5: Stand by assistance (supervision) Extremity Assessment  RLE Assessment  RLE Assessment: Exceptions to [facility] General Strength Comments: grossly generalized to 3/5 (hip flexion, abduction, adduction); pt with increased difficulty with active movements LLE Assessment LLE Assessment: Exceptions to [facility] General Strength Comments: grossly generalized to 4-/5  Care Tool Care Tool Bed Mobility Roll left and right activity   Roll left and right assist level: Contact Guard/Touching assist    Sit to lying activity        Lying to sitting edge of bed activity   Lying to sitting edge of bed assist level: Contact Guard/Touching assist     Care Tool Transfers Sit to stand transfer Sit to stand activity did not occur: Safety/medical concerns (R AKA, generalized weakness, decreased balance/postural control, poor endurance, fear)      Chair/bed transfer   Chair/bed transfer assist level: Moderate Assistance - Patient 50 - 74%     Psychologist, counselling transfer activity did not occur: Safety/medical concerns (R AKA, generalized weakness, decreased balance/postural control, poor endurance, fear)        Care Tool Locomotion Ambulation Ambulation activity did not occur: Safety/medical concerns (R AKA, generalized weakness, decreased balance/postural control, poor endurance, fear)        Walk 10 feet activity Walk 10 feet activity did not occur: Safety/medical concerns (R AKA, generalized weakness, decreased balance/postural control, poor endurance, fear)       Walk 50 feet with 2  turns activity Walk 50 feet with 2 turns activity did not occur: Safety/medical concerns (R AKA, generalized weakness, decreased balance/postural control, poor endurance, fear)      Walk 150 feet activity Walk 150 feet activity did not occur: Safety/medical concerns (R AKA, generalized weakness, decreased balance/postural control, poor endurance, fear)      Walk 10 feet on uneven surfaces activity Walk 10 feet on uneven surfaces activity did not occur: Safety/medical concerns (R AKA, generalized weakness, decreased balance/postural control, poor endurance, fear)      Stairs Stair activity did not occur: Safety/medical concerns (R AKA, generalized weakness, decreased balance/postural control, poor endurance, fear)        Walk up/down 1 step activity Walk up/down 1 step or curb (drop down) activity did not occur: Safety/medical concerns (R AKA, generalized weakness, decreased balance/postural control, poor endurance, fear)     Walk up/down 4 steps activity did not occuR: Safety/medical concerns (R AKA, generalized weakness, decreased balance/postural control, poor endurance, fear)  Walk up/down 4 steps activity      Walk up/down 12 steps activity Walk up/down 12 steps activity did not occur: Safety/medical concerns (R AKA, generalized weakness, decreased balance/postural control, poor endurance, fear)      Pick up small objects from floor Pick up small object from the floor (from standing position) activity did not occur: Safety/medical concerns (R AKA, generalized weakness, decreased balance/postural control, poor endurance, fear)      Wheelchair Will patient use wheelchair at discharge?: Yes Type of Wheelchair: Manual   Wheelchair assist level: Supervision/Verbal cueing Max wheelchair distance: 38f  Wheel 50 feet with 2 turns activity   Assist Level: Moderate Assistance - Patient 50 - 74%  Wheel 150 feet activity   Assist Level: Maximal Assistance - Patient 25 - 49%    Refer to  Care Plan for Long Term Goals  SHORT TERM GOAL WEEK 1 PT Short Term Goal 1 (Week 1): pt will perform bed mobility with supervision PT Short Term Goal 2 (Week 1): Pt will transfer sit<>stand with LRAD mod A PT  Short Term Goal 3 (Week 1): Pt will transfer bed<>chair with LRAD min A  Recommendations for other services: None   Skilled Therapeutic Intervention Evaluation completed (see details above and below) with education on PT POC and goals and individual treatment initiated with focus on functional mobility/transfers, generalized strengthening, dynamic sitting balance/coordination, and improved activity tolerance. Received pt supine in bed with wife present at bedside, pt educated on PT evaluation, CIR policies, and therapy schedule and agreeable. Pt reported pain 5/10 pain in L heel and 2/10 pain in R residual limb (premedicated) and declined any additional pain interventions. Pt required maximal time and encouragement for all mobility and is extremely slow to initiate movement. Pt is HOH and required hearing aide device. O2 sat 93% and HR 86bpm at rest on 2L O2 via [place]. Pt transferred supine<>sitting EOB with CGA and use of bedrails. Pt able to maintain static sitting balance with bilateral UE support and L LE support with supervision for ~15 minutes during initial assessment. With encouragement pt transferred bed<>WC via lateral scoot with mod A with cues for anterior weight shifting, head/hips relationship, and hand placement on WC armrests. Pt required encouragement as he was fearful of falling. Pt reported feeling "exhausted" and reported increased SOB after transfer. O2 sat 90% increasing to 92% with 4 minute seated rest break. Pt performed WC mobility 12f using bilateral UEs and supervision with 1 rest break in between. O2 sat 97% on 2L O2 however pt reported increased SOB. Pt required cues for propulsion technique and was pleased that he was able to push himself in the chair as this is a goal of  his. Pt concerned with his progress at end of session stating "I didn't fail out of your program did I?" Therapist reassured pt. Concluded session with pt sitting in WC, needs within reach, and chair pad alarm on. Safety plan updated.   Mobility Bed Mobility Bed Mobility: Rolling Right;Rolling Left;Supine to Sit;Sit to Supine Rolling Right: Contact Guard/Touching assist Rolling Left: Contact Guard/Touching assist Supine to Sit: Contact Guard/Touching assist Sit to Supine: Contact Guard/Touching assist Transfers Transfers: Lateral/Scoot Transfers Lateral/Scoot Transfers: Moderate Assistance - Patient 50-74% Transfer (Programmer, multimedia: None LShip broker Yes Wheelchair Assistance: SChartered loss adjuster Both upper extremities Wheelchair Parts Management: Needs assistance Distance: 384f Discharge Criteria: Patient will be discharged from PT if patient refuses treatment 3 consecutive times without medical reason, if treatment goals not met, if there is a change in medical status, if patient makes no progress towards goals or if patient is discharged from hospital.  The above assessment, treatment plan, treatment alternatives and goals were discussed and mutually agreed upon: by patient and by family  AnAlfonse AlpersT, DPT  08/15/2019, 12:33 PM

## 2019-08-15 NOTE — Progress Notes (Signed)
Patient Details  Name: [name] MRN: [id number] Date of Birth: [birth year]  Today's Date: 08/15/2019  Hospital Problems: Principal Problem:   S/P AKA (above knee amputation) ([place]) Active Problems:   Hypoalbuminemia due to protein-calorie malnutrition (HCC)   Transaminitis   Acute blood loss anemia   Hyponatremia   Postoperative pain   Supplemental oxygen dependent  Past Medical History:  Past Medical History:  Diagnosis Date  . Anxiety   . Arthritis    "generalized" (04/04/2017)  . Asthma   . Basal cell carcinoma    "left ear"  . BPH (benign prostatic hyperplasia)    severe; s/p multiple biopsies  . CAD (coronary artery disease)   . Carotid artery occlusion   . Chronic respiratory failure ([place])   . Chronic rhinitis   . COPD (chronic obstructive pulmonary disease) (HCC)    2L [place] O2  . Diastolic heart failure ([place]) 2019  . Dilation of biliary tract   . Elevated troponin 03/09/2017  . Gallstones   . GERD (gastroesophageal reflux disease)   . History of blood transfusion    "w/his CABG" (04/04/2017)  . History of kidney stones   . Hyperlipidemia   . Hypertension   . On home oxygen therapy    "~ 24/7" (04/04/2017)  . Peripheral vascular disease ([place])   . Pneumonia 2019  . Squamous cell carcinoma of skin 04/23/2013   in situ-Right flank (txpbx)  . Squamous cell carcinoma of skin 01/23/2017   [place]/ in siut- Left ear rim (CX3FU)  . Syncope and collapse   . Ureteral tumor 08/2015   had endoscopic procedure for evaluation, unable to reach for biopsy  . Urinary catheter (Foley) change required    Past Surgical History:  Past Surgical History:  Procedure Laterality Date  . ABDOMINAL AORTOGRAM W/LOWER EXTREMITY Bilateral 06/30/2019   Procedure: ABDOMINAL AORTOGRAM W/LOWER EXTREMITY;  Surgeon: Waynetta Sandy, MD;  Location: [facility];  Service: Cardiovascular;  Laterality: Bilateral;  . AMPUTATION Right 08/11/2019   Procedure: RIGHT ABOVE KNEE  AMPUTATION;  Surgeon: Angelia Mould, MD;  Location: [facility];  Service: Vascular;  Laterality: Right;  . BASAL CELL CARCINOMA EXCISION Left    ear  . CARDIAC CATHETERIZATION     "prior to bypass"  . CATARACT EXTRACTION W/ INTRAOCULAR LENS  IMPLANT, BILATERAL Bilateral   . CORONARY ARTERY BYPASS GRAFT  10/2001   LIMA to LAD, SVG to OM1-2, SVG to RCA and PDA  . LITHOTRIPSY    . PROSTATE BIOPSY  Oct. 2014  . TONSILLECTOMY     Social History:  reports that he quit smoking about 37 years ago. His smoking use included cigarettes. He has a 66.00 pack-year smoking history. He has never used smokeless tobacco. He reports current alcohol use. He reports that he does not use drugs.  Family / Support Systems Spouse/Significant Other: Jacqueline Children: 2 children (son and Dtr) Anticipated Caregiver: [name] (spouse) Ability/Limitations of Caregiver: Min A Caregiver Availability: 24/7  Social History Preferred language: English Religion: Ep[employer]opalian Read: Yes Write: Yes   Abuse/Neglect Abuse/Neglect Assessment Can Be Completed: Yes Physical Abuse: Denies Verbal Abuse: Denies Sexual Abuse: Denies Exploitation of patient/patient's resources: Denies Self-Neglect: Denies  Emotional Status Pt's affect, behavior and adjustment status: no Recent Psychosocial Issues: no Psychiatric History: no Substance Abuse History: no  Patient / Family Perceptions, Expectations & Goals Pt/Family understanding of illness & functional limitations: yes Pt/family expectations/goals: Goal to discharge home with spouse  [employer]: None Premorbid  Home Care/DME Agencies: None Transportation available at discharge: spouse able to transport  Discharge Planning Living Arrangements: Spouse/significant other Support Systems: Spouse/significant other, Children Type of Residence: Private residence (3 level home, 15 steps to bedroom. No bathroom on 1st level) Insurance  Resources: Medicare Living Expenses: Own Money Management: Patient, Spouse Does the patient have any problems obtaining your medications?: No Care Coordinator Barriers to Discharge: Inaccessible home environment, Home environment access/layout Care Coordinator Barriers to Discharge Comments: 15 Steps to get to bed room. No bathroom on 1st floor Care Coordinator Anticipated Follow Up Needs: HH/OP Expected length of stay: 15-19 Days  Clinical Impression Sw entered room, introduced self, explained role and process. Spouse at bedside.  Sw will continue to follow up with questions and concerns.  Dyanne Iha 08/15/2019, 1:07 PM

## 2019-08-15 NOTE — Progress Notes (Signed)
   VASCULAR SURGERY ASSESSMENT & PLAN:   POD 4 RIGHT AKA: I inspected his right AKA this morning.  This is healing nicely with some mild ecchymosis posteriorly.  Continue daily dressing changes.  I suspect he will make slow progress with rehab.  He states that he feels quite weak.  Vascular surgery will follow peripherally.  I am away next week and my partners will follow.  SUBJECTIVE:   He is concerned about his overall health.  His pain in the amputation site is under good control.  PHYSICAL EXAM:   Vitals:   08/14/19 2003 08/15/19 0443 08/15/19 0446 08/15/19 0451  BP: 120/64  114/74   Pulse: 93  90   Resp: 18  20   Temp: 98.1 F (36.7 C)  97.8 F (36.6 C)   SpO2: 100%  99%   Weight:  61.5 kg  60 kg   I inspected his right AKA and this is healing well with some ecchymosis posteriorly.  LABS:   Lab Results  Component Value Date   WBC 8.6 08/15/2019   HGB 8.8 (L) 08/15/2019   HCT 27.2 (L) 08/15/2019   MCV 96.5 08/15/2019   PLT 279 08/15/2019   Lab Results  Component Value Date   CREATININE 0.99 08/15/2019    PROBLEM LIST:    Principal Problem:   S/P AKA (above knee amputation) (HCC)   CURRENT MEDS:   . aspirin EC  81 mg Oral Daily  . atorvastatin  20 mg Oral QHS  . budesonide-formoterol  2 puff Inhalation BID  . Chlorhexidine Gluconate Cloth  6 each Topical q morning - 10a  . cycloSPORINE  1 drop Both Eyes BID  . docusate sodium  100 mg Oral Daily  . enoxaparin (LOVENOX) injection  40 mg Subcutaneous QHS  . famotidine  20 mg Oral QHS  . feeding supplement (ENSURE ENLIVE)  237 mL Oral BID BM  . finasteride  5 mg Oral Q lunch  . fluticasone  2 spray Each Nare Daily  . furosemide  20 mg Oral BID  . metoprolol succinate  12.5 mg Oral Daily  . polyethylene glycol  17 g Oral BID  . predniSONE  10 mg Oral q AM  . sodium chloride  1 drop Both Eyes QID  . sodium phosphate  1 enema Rectal Once  . spironolactone  12.5 mg Oral QODAY  . [START ON 08/16/2019]  spironolactone  25 mg Oral QODAY  . umeclidinium bromide  1 puff Inhalation Daily    Deitra Mayo Office: [phone] 08/15/2019

## 2019-08-15 NOTE — Plan of Care (Signed)
  Problem: Consults Goal: RH LIMB LOSS PATIENT EDUCATION Description: Description: See Patient Education module for eduction specifics. Outcome: Progressing Goal: Skin Care Protocol Initiated - if Braden Score 18 or less Description: If consults are not indicated, leave blank or document N/A Outcome: Progressing Goal: Nutrition Consult-if indicated Outcome: Progressing Goal: Diabetes Guidelines if Diabetic/Glucose > 140 Description: If diabetic or lab glucose is > 140 mg/dl - Initiate Diabetes/Hyperglycemia Guidelines & Document Interventions  Outcome: Progressing   Problem: RH BOWEL ELIMINATION Goal: RH STG MANAGE BOWEL WITH ASSISTANCE Description: STG Manage Bowel with mod Assistance. Outcome: Progressing Goal: RH STG MANAGE BOWEL W/MEDICATION W/ASSISTANCE Description: STG Manage Bowel with Medication with mod Assistance. Outcome: Progressing   Problem: RH BLADDER ELIMINATION Goal: RH STG MANAGE BLADDER WITH ASSISTANCE Description: STG Manage Bladder With  mod Assistance Outcome: Progressing Goal: RH STG MANAGE BLADDER WITH MEDICATION WITH ASSISTANCE Description: STG Manage Bladder With Medication With mod Assistance. Outcome: Progressing Goal: RH STG MANAGE BLADDER WITH EQUIPMENT WITH ASSISTANCE Description: STG Manage Bladder With Equipment With mod  Assistance Outcome: Progressing   Problem: RH SKIN INTEGRITY Goal: RH STG SKIN FREE OF INFECTION/BREAKDOWN Description: Pt will be aware of care of wound and infection prevention measures with mod assist use of air mattress Outcome: Progressing Goal: RH STG MAINTAIN SKIN INTEGRITY WITH ASSISTANCE Description: STG Maintain Skin Integrity With mod Assistance. Outcome: Progressing Goal: RH STG ABLE TO PERFORM INCISION/WOUND CARE W/ASSISTANCE Description: STG Able To Perform Incision/Wound Care With Mod Assistance. Outcome: Progressing   Problem: RH SAFETY Goal: RH STG ADHERE TO SAFETY PRECAUTIONS  W/ASSISTANCE/DEVICE Description: STG Adhere to Safety Precautions With mod  Assistance/Devices as recommended by therapy Outcome: Progressing   Problem: RH PAIN MANAGEMENT Goal: RH STG PAIN MANAGED AT OR BELOW PT'S PAIN GOAL Description: Pain <=4/10 Outcome: Progressing

## 2019-08-15 NOTE — Progress Notes (Signed)
Patient information reviewed and entered into [facility] System by Becky Mackinsey Pelland, PPS coordinator. Information including medical coding, function ability, and quality indicators will be reviewed and updated through discharge.   

## 2019-08-15 NOTE — Progress Notes (Signed)
@  1730-Pt wife called because pt wasn't feeling well stating his pulse felt erratic. Pt vitals fine, with stethoscope pulse was regular but with skipped beat about every 8th. Looking through notes, EKG from 2 days ago showed NS with PACs. When asked about pain in chest he says it comes and goes but nothing new or different about this time. Episode seems identical to note on 8/4. Wife says he is sleepy but has had no narcotics, daughter also at bedside and notes he has been this way all week.This RN stated some fatigue was to be expected after new full therapy schedule. Was very clear with family that I saw no new changes or anything new on assessment that warranted concern but that if they felt any concern I was happy to call rapid response for them. Wife and daughter discussed they wanted to hold off and see how he does.   @1830 -Pt sitting at EOB with family eating dinner and laughing at pictures sent from son. Will pass off to new RN to monitor but no changes or further actions needed at this time.

## 2019-08-15 NOTE — Progress Notes (Signed)
Occupational Therapy Session Note  Patient Details  Name: [name] MRN: [id number] Date of Birth: 08/22/1929  Today's Date: 08/15/2019 OT Individual Time: 5400-8676 OT Individual Time Calculation (min): 42 min    Short Term Goals: Week 1:  OT Short Term Goal 1 (Week 1): Patient will complete 1/3 parts of LB dressing task seated EOB. OT Short Term Goal 2 (Week 1): Patient will complete LB bathing seated EOB with Min A. OT Short Term Goal 3 (Week 1): Patient will complete lateral scoot transfer to drop-arm BSC with Min A. OT Short Term Goal 4 (Week 1): Patient will complete toileting/hygiene/clothing management with Min A and lateral leans seated on BSC.  Skilled Therapeutic Interventions/Progress Updates:    Treatment session with focus on completion of grooming tasks and functional transfers.  Pt received upright in recliner with wife present.  Pt expressing desire to complete oral care after lunch.  Completed oral care and washing face in sitting at sink with setup of items.  Therapist obtained drop arm BSC and educated pt and wife on use of drop arm BSC for toilet transfers at this time.  Completed lateral scoot transfers mod assist to Rt and min-mod assist to Lt with improved clearance of buttocks when transferring to Lt.  Pt returned to bed min assist lateral scoot transfer and positioned in semi-reclined position with all needs in reach.  Therapy Documentation Precautions:  Precautions Precautions: Fall Precaution Comments: R AKA Restrictions Weight Bearing Restrictions: Yes RLE Weight Bearing: Non weight bearing Other Position/Activity Restrictions: On 2L O2 Pain: Pain Assessment Pain Scale: 0-10 Pain Score: 4  Pain Type: Surgical pain Pain Location: Leg Pain Orientation: Right Pain Descriptors / Indicators: Aching Pain Onset: Gradual Pain Intervention(s): Medication (See eMAR)   Therapy/Group: Individual Therapy  [name] 08/15/2019, 3:25 PM

## 2019-08-15 NOTE — Progress Notes (Signed)
Initial Nutrition Assessment  DOCUMENTATION CODES:   Not applicable, will assess for malnutrition at follow-up after completion of NFPE  INTERVENTION:   - Continue Ensure Enlive po BID, each supplement provides 350 kcal and 20 grams of protein  - ProSource Plus 30 ml po BID, each supplement provides 100 kcal and 15 grams of protein  - Vanilla Greek yogurt TID with meals  - MVI with minerals daily  NUTRITION DIAGNOSIS:   Increased nutrient needs related to wound healing as evidenced by estimated needs.  GOAL:   Patient will meet greater than or equal to 90% of their needs  MONITOR:   PO intake, Supplement acceptance, Labs, Weight trends, Skin  REASON FOR ASSESSMENT:   Malnutrition Screening Tool    ASSESSMENT:   86 year old male with PMH of COPD on 2L O2 at baseline, BPH with foley for retention, PAF, CAD s/p CABG, PVD with chronic nonhealing wound on right foot. Pt was admitted on 8/02 for right AKA. Admitted to CIR on 8/05.   Spoke with pt and wife at bedside. Pt is very HOH. Pt eating lunch at time of visit so majority of history obtained from pt's wife.  [name] shares that pt's appetite has been decreased over the last several months related to vascular issues. She reports that pt hast lost some weight over last 4 weeks related to his "acute issues." She states that pt has been losing muscle mass for longer, about 2-3 months. She is concerned about his PO intake and especially protein intake.  Pt typically eats "lighter" meals at home. For example, pt enjoys cheese and crackers or a cheese quesadilla. Here at the hospital, pt likes the burgers.  Discussed available oral nutrition supplements. Will continue with Ensure Enlive (pt accepts on and off) and ProSource (pt accepted well before lunch) supplements. Pt also enjoys vanilla [place] yogurt so RD will order this TID with meals.  Pt weights himself daily at home. His UBW is 134 lbs and has been over the last few  years. Reviewed weight history in chart. Pt with a 4.3 kg weight loss since 07/02/19. This is a 6.7% weight loss in 1.5 months which is significant for timeframe. Suspect malnutrition but unable confirm without NFPE. RD will complete NFPE at follow-up.  At end of RD interview, pt had completed ~75% of his lunch meal (half of [place] sandwich and fruit cup).  Meal Completion: 100% x 1 documented meal  Medications reviewed and include: ProSource Plus BID, colace, pepcid, Ensure Enlive BID, lasix, miralax, prednisone, spironolactone  Labs reviewed: sodium 129, elevated LFTs, hemoglobin 8.8  NUTRITION - FOCUSED PHYSICAL EXAM:  Unable to complete at this time. Pt eating lunch. RD will re-attempt at follow-up.  Diet Order:   Diet Order            Diet Heart Room service appropriate? Yes; Fluid consistency: Thin  Diet effective now                 EDUCATION NEEDS:   Education needs have been addressed  Skin:  Skin Assessment: Skin Integrity Issues: Incisions: s/p right AKA  Last BM:  08/14/19 large type 2  Height:   Ht Readings from Last 1 Encounters:  08/11/19 5\' 9"  (1.753 m)    Weight:   Wt Readings from Last 1 Encounters:  08/15/19 60 kg    BMI:  Body mass index is 19.53 kg/m.  Estimated Nutritional Needs:   Kcal:  1850-2050  Protein:  85-100 grams  Fluid:  >/=  1.8 L    Gaynell Face, MS, RD, LDN Inpatient Clinical Dietitian Please see [facility] for contact information.

## 2019-08-15 NOTE — Evaluation (Addendum)
Occupational Therapy Assessment and Plan  Patient Details  Name: [name] MRN: [id number] Date of Birth: Jul 12, 1929  OT Diagnosis: abnormal posture, acute pain, muscle weakness (generalized) and swelling of limb Rehab Potential: GOOD ELOS: 10-14 days  Today's Date: 08/15/2019 OT Individual Time: 1696-7893 OT Individual Time Calculation (min): 74 min     Hospital Problem: Principal Problem:   S/P AKA (above knee amputation) (HCC) Active Problems:   Hypoalbuminemia due to protein-calorie malnutrition (HCC)   Transaminitis   Acute blood loss anemia   Hyponatremia   Postoperative pain   Supplemental oxygen dependent   Past Medical History:  Past Medical History:  Diagnosis Date  . Anxiety   . Arthritis    "generalized" (04/04/2017)  . Asthma   . Basal cell carcinoma    "left ear"  . BPH (benign prostatic hyperplasia)    severe; s/p multiple biopsies  . CAD (coronary artery disease)   . Carotid artery occlusion   . Chronic respiratory failure ([place])   . Chronic rhinitis   . COPD (chronic obstructive pulmonary disease) (HCC)    2L [place] O2  . Diastolic heart failure ([place]) 2019  . Dilation of biliary tract   . Elevated troponin 03/09/2017  . Gallstones   . GERD (gastroesophageal reflux disease)   . History of blood transfusion    "w/his CABG" (04/04/2017)  . History of kidney stones   . Hyperlipidemia   . Hypertension   . On home oxygen therapy    "~ 24/7" (04/04/2017)  . Peripheral vascular disease ([place])   . Pneumonia 2019  . Squamous cell carcinoma of skin 04/23/2013   in situ-Right flank (txpbx)  . Squamous cell carcinoma of skin 01/23/2017   [place]/ in siut- Left ear rim (CX3FU)  . Syncope and collapse   . Ureteral tumor 08/2015   had endoscopic procedure for evaluation, unable to reach for biopsy  . Urinary catheter (Foley) change required    Past Surgical History:  Past Surgical History:  Procedure Laterality Date  . ABDOMINAL AORTOGRAM W/LOWER EXTREMITY  Bilateral 06/30/2019   Procedure: ABDOMINAL AORTOGRAM W/LOWER EXTREMITY;  Surgeon: Waynetta Sandy, MD;  Location: [facility];  Service: Cardiovascular;  Laterality: Bilateral;  . AMPUTATION Right 08/11/2019   Procedure: RIGHT ABOVE KNEE AMPUTATION;  Surgeon: Angelia Mould, MD;  Location: [facility];  Service: Vascular;  Laterality: Right;  . BASAL CELL CARCINOMA EXCISION Left    ear  . CARDIAC CATHETERIZATION     "prior to bypass"  . CATARACT EXTRACTION W/ INTRAOCULAR LENS  IMPLANT, BILATERAL Bilateral   . CORONARY ARTERY BYPASS GRAFT  10/2001   LIMA to LAD, SVG to OM1-2, SVG to RCA and PDA  . LITHOTRIPSY    . PROSTATE BIOPSY  Oct. 2014  . TONSILLECTOMY      Assessment & Plan Clinical Impression: 86 year old male with history of COPD- chronic hypoxic respiratory failure and 2 L oxygen dependent, BPH- foley for retention, PAF, CAD s/p CABG, PVD with chronic nonhealing wound on right foot and no options for revascularizations who was admitted on 08/11/19 for R-AKA by Dr. Scot Dock. Chronic diastolic heart failure being monitored and was placed on 1500cc FR.  Pain control and weakness continued to be an issues. Had issues with orthostatic symptoms with attempts at sitting at EOB. Patient transferred to CIR on 08/14/2019 .    Patient currently requires mod with basic self-care skills secondary to muscle weakness and decreased cardiorespiratoy endurance and decreased oxygen support.  Prior to hospitalization, patient could complete BADLs/IADLs with independence.  Patient will benefit from skilled intervention to decrease level of assist with basic self-care skills and increase independence with basic self-care skills prior to discharge home with care partner.  Anticipate patient will require 24 hour supervision and follow up home health.  OT - End of Session Activity Tolerance: Tolerates < 10 min activity with changes in vital signs Endurance Deficit: Yes OT Assessment Rehab  Potential (ACUTE ONLY): Good OT Barriers to Discharge: Inaccessible home environment;Home environment access/layout OT Plan OT Intensity: Minimum of 1-2 x/day, 45 to 90 minutes OT Frequency: 5 out of 7 days OT Duration/Estimated Length of Stay: 10-14 days OT Treatment/Interventions: Medical illustrator training;Community reintegration;Discharge planning;DME/adaptive equipment instruction;Functional mobility training;Pain management;Patient/family education;Self Care/advanced ADL retraining;Psychosocial support;Therapeutic Activities;Therapeutic Exercise;UE/LE Strength taining/ROM;Wheelchair propulsion/positioning OT Recommendation Patient destination: Home Follow Up Recommendations: Home health OT;24 hour supervision/assistance Equipment Recommended: 3 in 1 bedside comode Equipment Details: Patient may benefit from wide drop-arm BSC   OT Evaluation Precautions/Restrictions  Restrictions Weight Bearing Restrictions: Yes RLE Weight Bearing: Non weight bearing Pain Pain Assessment Pain Scale: 0-10 Pain Score: 0-No pain Home Living/Prior Functioning Home Living Family/patient expects to be discharged to:: Private residence Living Arrangements: Spouse/significant other Available Help at Discharge: Family, Available 24 hours/day Type of Home: House Home Access: Level entry Home Layout: Multi-level, Bed/bath upstairs Alternate Level Stairs-Number of Steps: Enters from carport with level entry. 9 to kitchen/dining room/living room. 6 additional steps to bedroom/bathroom with 3 bedrooms and 2 bathrooms with 1 rail with each set of stairs. Alternate Level Stairs-Rails: Right Bathroom Shower/Tub: Gaffer, Chiropodist: Standard Bathroom Accessibility: Yes Additional Comments: O2 2L  Lives With: Spouse IADL History Current License: Yes Mode of Transportation: Car Occupation: Retired Type of Occupation: Chiropractor Leisure and Hobbies:  Photography IADL Comments: Wife responsible for IADLs including homemaking and meal prep. Prior Function Level of Independence: Independent with basic ADLs, Independent with homemaking with ambulation, Requires assistive device for independence  Able to Take Stairs?: Yes Driving: Yes Vocation: Retired Vision Baseline Vision/History: Wears glasses Wears Glasses: At all times Patient Visual Report: No change from baseline Perception  Perception: Within Functional Limits Praxis Praxis: Intact Cognition Overall Cognitive Status: Within Functional Limits for tasks assessed Arousal/Alertness: Awake/alert Orientation Level: Person;Place;Situation Person: Oriented Place: Oriented Situation: Oriented Year: 2021 Month: August Day of Week: Correct Memory: Appears intact Immediate Memory Recall: Sock;Blue;Bed Memory Recall Sock: Without Cue Memory Recall Blue: Without Cue Memory Recall Bed: Without Cue Awareness: Appears intact Problem Solving: Appears intact Safety/Judgment: Appears intact Comments: Cues for hand placement and walker manipulation. Sensation Sensation Light Touch: Impaired by gross assessment Proprioception: Impaired by gross assessment Additional Comments: decreased along incision and at L2 dermatome on R residual limb. Decreased sensation along L great toe. Coordination Gross Motor Movements are Fluid and Coordinated: No Fine Motor Movements are Fluid and Coordinated: Yes Coordination and Movement Description: grossly uncoordinated due to R AKA, decreased balance/postural control, generalized weakness, poor endurance, and fatigue Finger Nose Finger Test: [facility] but slow Heel Shin Test: decreased on L LE unable to perform on R LE due to AKA Motor  Motor Motor: Abnormal postural alignment and control Motor - Skilled Clinical Observations: grossly uncoordinated due to R AKA, decreased balance/postural control, generalized weakness, poor endurance, and fatigue   Trunk/Postural Assessment  Cervical Assessment Cervical Assessment: Exceptions to [facility] (rounded shoulders and forward head) Thoracic Assessment Thoracic Assessment: Exceptions to [facility] (kyphosis) Lumbar Assessment Lumbar Assessment: Exceptions to [facility] (posterior  pelvic tilt) Postural Control Postural Control: Deficits on evaluation  Balance Balance Balance Assessed: Yes Static Sitting Balance Static Sitting - Balance Support: Bilateral upper extremity supported;Feet supported Static Sitting - Level of Assistance: 5: Stand by assistance (supervision) Dynamic Sitting Balance Dynamic Sitting - Balance Support: Bilateral upper extremity supported;Feet unsupported Dynamic Sitting - Level of Assistance: 5: Stand by assistance (supervision) Extremity/Trunk Assessment RUE Assessment RUE Assessment: Within Functional Limits LUE Assessment LUE Assessment: Within Functional Limits  Care Tool Care Tool Self Care Eating   Eating Assist Level: Independent    Oral Care         Bathing   Body parts bathed by patient: Right arm;Left arm;Chest;Abdomen;Front perineal area;Face;Right upper leg Body parts bathed by helper: Buttocks;Left upper leg;Left lower leg Body parts n/a: Right lower leg Assist Level: Moderate Assistance - Patient 50 - 74%    Upper Body Dressing(including orthotics)   What is the patient wearing?: Pull over shirt   Assist Level: Minimal Assistance - Patient > 75%    Lower Body Dressing (excluding footwear)   What is the patient wearing?: Underwear/pull up;Pants Assist for lower body dressing: Moderate Assistance - Patient 50 - 74%    Putting on/Taking off footwear   What is the patient wearing?: Non-skid slipper socks Assist for footwear: Minimal Assistance - Patient > 75%       Care Tool Toileting Toileting activity         Care Tool Bed Mobility Roll left and right activity   Roll left and right assist level: Contact Guard/Touching assist    Sit to lying  activity        Lying to sitting edge of bed activity   Lying to sitting edge of bed assist level: Contact Guard/Touching assist     Care Tool Transfers Sit to stand transfer Sit to stand activity did not occur: Safety/medical concerns (R AKA, generalized weakness, decreased balance/postural control, poor endurance, fear)      Chair/bed transfer   Chair/bed transfer assist level: Moderate Assistance - Patient 50 - 74%     Toilet transfer         Care Tool Cognition Expression of Ideas and Wants Expression of Ideas and Wants: Without difficulty (complex and basic) - expresses complex messages without difficulty and with speech that is clear and easy to understand   Understanding Verbal and Non-Verbal Content Understanding Verbal and Non-Verbal Content: Understands (complex and basic) - clear comprehension without cues or repetitions   Memory/Recall Ability *first 3 days only      Refer to Care Plan for Long Term Goals  SHORT TERM GOAL WEEK 1 OT Short Term Goal 1 (Week 1): Patient will complete 1/3 parts of LB dressing task seated EOB. OT Short Term Goal 2 (Week 1): Patient will complete LB bathing seated EOB with Min A. OT Short Term Goal 3 (Week 1): Patient will complete lateral scoot transfer to drop-arm BSC with Min A. OT Short Term Goal 4 (Week 1): Patient will complete toileting/hygiene/clothing management with Min A and lateral leans seated on BSC.  Recommendations for other services: Other: TBD   Skilled Therapeutic Intervention Mod OT evaluation/treatment completed this date with patient demonstrating Mod A for BADLs grossly. Patient limited by decreased activity tolerance, fatigue, pain in residual limb and L heel, and fear of falling. Process of rehab, ELOS and establishment of goals discussed with patient in agreement. Patient would benefit from CIR to maximize safety and independence with self-care tasks, functional transfers, and mobility in  prep for safe return home  with wife who is able to provide 24hr supervision/assist.   ADL ADL Eating: Set up Where Assessed-Eating: Bed level Upper Body Bathing: Minimal assistance Where Assessed-Upper Body Bathing: Bed level Lower Body Bathing: Moderate assistance Where Assessed-Lower Body Bathing: Bed level Upper Body Dressing: Minimal assistance Where Assessed-Upper Body Dressing: Bed level Lower Body Dressing: Moderate assistance;Minimal cueing Where Assessed-Lower Body Dressing: Bed level ADL Comments: Min A grossly UB BADLs, and Mod A grossly LB BADLs. Mobility  Bed Mobility Bed Mobility: Rolling Right;Rolling Left;Supine to Sit;Sit to Supine Rolling Right: Contact Guard/Touching assist Rolling Left: Contact Guard/Touching assist Supine to Sit: Contact Guard/Touching assist Sit to Supine: Contact Guard/Touching assist   Discharge Criteria: Patient will be discharged from OT if patient refuses treatment 3 consecutive times without medical reason, if treatment goals not met, if there is a change in medical status, if patient makes no progress towards goals or if patient is discharged from hospital.  The above assessment, treatment plan, treatment alternatives and goals were discussed and mutually agreed upon: by patient  Juwan Vences R [place]-Davis 08/15/2019, 12:42 PM

## 2019-08-15 NOTE — Progress Notes (Signed)
Pt is on his home bipap machine and tolerating well.  RT will continue to monitor.

## 2019-08-15 NOTE — Progress Notes (Signed)
[facility] Individual Statement of Services  Patient Name:  [name]  Date:  08/15/2019  Welcome to the [facility].  Our goal is to provide you with an individualized program based on your diagnosis and situation, designed to meet your specific needs.  With this comprehensive rehabilitation program, you will be expected to participate in at least 3 hours of rehabilitation therapies Monday-Friday, with modified therapy programming on the weekends.  Your rehabilitation program will include the following services:  Physical Therapy (PT), Occupational Therapy (OT), Speech Therapy (ST), 24 hour per day rehabilitation nursing, Therapeutic Recreaction (TR), Neuropsychology, Care Coordinator, Rehabilitation Medicine, Nutrition Services, Pharmacy Services and Other  Weekly team conferences will be held on Wednesdays to discuss your progress.  Your Inpatient Rehabilitation Care Coordinator will talk with you frequently to get your input and to update you on team discussions.  Team conferences with you and your family in attendance may also be held.  Expected length of stay: 15-19 Days  Overall anticipated outcome:  supervision  Depending on your progress and recovery, your program may change. Your Inpatient Rehabilitation Care Coordinator will coordinate services and will keep you informed of any changes. Your Inpatient Rehabilitation Care Coordinator's name and contact numbers are listed  below.  The following services may also be recommended but are not provided by the [facility]:    [place] will be made to provide these services after discharge if needed.  Arrangements include referral to agencies that provide these services.  Your insurance has been verified to be:  Medicare Your primary doctor is:  [name], MD  Pertinent information will be  shared with your doctor and your insurance company.  Inpatient Rehabilitation Care Coordinator:  Erlene Quan, [place] or [phone]  Information discussed with and copy given to patient by: Dyanne Iha, 08/15/2019, 12:07 PM

## 2019-08-15 NOTE — Progress Notes (Signed)
[facility] PHYSICAL MEDICINE & REHABILITATION PROGRESS NOTE  Subjective/Complaints: Patient seen sitting up in bed this morning.  He notes transient diffuse pain.  He states he slept fairly overnight.  He is apprehensively ready begin therapies with therapies awaiting.  ROS: Transient generalized pain. Denies CP, SOB, N/V/D  Objective: Vital Signs: Blood pressure 114/74, pulse 90, temperature 97.8 F (36.6 C), resp. rate 20, weight 60 kg, SpO2 99 %. No results found. Recent Labs    08/14/19 0047 08/15/19 0554  WBC 11.2* 8.6  HGB 9.1* 8.8*  HCT 28.3* 27.2*  PLT 257 279   Recent Labs    08/14/19 0047 08/15/19 0554  NA 130* 129*  K 4.2 4.1  CL 93* 90*  CO2 30 30  GLUCOSE 92 93  BUN 18 18  CREATININE 0.89 0.99  CALCIUM 8.5* 8.6*    Physical Exam: BP 114/74 (BP Location: Right Arm)   Pulse 90   Temp 97.8 F (36.6 C)   Resp 20   Wt 60 kg   SpO2 99%   BMI 19.53 kg/m  Constitutional: No distress . Vital signs reviewed.  Frail. HENT: Normocephalic.  Atraumatic. Eyes: EOMI. No discharge. Cardiovascular: No JVD.  RRR. Respiratory: Normal effort.  No stridor.  Bilateral clear to auscultation.  + [place]. GI: Non-distended.  BS +. GU: + Foley. Skin: Right AKA with central area of serosanguineous drainage and erythema along posterior flap  Psych: Normal mood.  Normal behavior. Musc: Right AKA with edema and tenderness. Neuro: Alert Extremely HOH Motor: Bilateral upper extremities: 4/5 proximal distally  Left lower extremity: 4/5 proximal distal Right lower extremity: Flexion, knee extension 3+/5 (some pain patient)  Assessment/Plan: 1. Functional deficits secondary to right AKA which require 3+ hours per day of interdisciplinary therapy in a comprehensive inpatient rehab setting.  Physiatrist is providing close team supervision and 24 hour management of active medical problems listed below.  Physiatrist and rehab team continue to assess barriers to discharge/monitor  patient progress toward functional and medical goals  Care Tool:  Bathing              Bathing assist       Upper Body Dressing/Undressing Upper body dressing        Upper body assist      Lower Body Dressing/Undressing Lower body dressing            Lower body assist       Toileting Toileting    Toileting assist       Transfers Chair/bed transfer  Transfers assist           Locomotion Ambulation   Ambulation assist              Walk 10 feet activity   Assist           Walk 50 feet activity   Assist           Walk 150 feet activity   Assist           Walk 10 feet on uneven surface  activity   Assist           Wheelchair     Assist               Wheelchair 50 feet with 2 turns activity    Assist            Wheelchair 150 feet activity     Assist  Medical Problem List and Plan: 1.  Impaired mobility and ADLs secondary to right AKA  Begin CIR evaluations  2.  Antithrombotics: -DVT/anticoagulation:  Pharmaceutical: Heparin             -antiplatelet therapy: Aspirin 81mg  daily 3. Pain Management: Has residual limb pain--educate on desensitization techniques.   [place] as needed  Monitor with increased exertion. 4. Mood: Appears depressed--team to provide ego support. LCSW to follow for evaluation and support.              -antipsychotic agents: N/A 5. Neuropsych: This patient may be capable of making decisions on his own behalf. Intermittently disoriented.  6. Skin/Wound Care:  Monitor wound for healing.  7. Fluids/Electrolytes/Nutrition: Monitor I/Os 8.COPD/chronic hypoxic respiratory failure: On chronic steriods, Symbicort BID and Incruse  Require supplemental oxygen at baseline 9. BPH/renal calculi:   Foley in place--has failed voiding trials x 2. Last changed 2 weeks ago.   Continue finasteride.   Plan to attempt voiding trial early next week 10. Anxiety d/o:  continue Xanax tid prn. 11. Hyponatremia:   Sodium 129 on 8/6, labs ordered for Monday 12. ABLA:   8.8 on 8/6, labs ordered for Monday 13. Nasal congestion: Afrin nasal spray X 3 days.  Added saline spray as well as humidification.   14. HTN: Has history of orthostatic hypotension.  Monitor BP   Continue lasix bid, aldactone 12.5/25 mgQOD and Toprol XL.   Monitor with increased exertion 15. CAD/Chronic systolic CHF: Monitor for symptoms with increase in activity. On lasix bid, aldactone, toprol XL, ASA  and Lipitor Filed Weights   08/15/19 0443 08/15/19 0451  Weight: 61.5 kg 60 kg  16.  Transaminitis  LFTs elevated on 8/6  Continue to monitor 17.  Severe hypoalbuminemia  Supplement initiated on 8/6  LOS: 1 days A FACE TO FACE EVALUATION WAS PERFORMED  Jaimon Bugaj Lorie [place]ix 08/15/2019, 10:09 AM

## 2019-08-16 ENCOUNTER — Inpatient Hospital Stay (HOSPITAL_COMMUNITY): Payer: Medicare Other | Admitting: Physical Therapy

## 2019-08-16 ENCOUNTER — Inpatient Hospital Stay (HOSPITAL_COMMUNITY): Payer: Medicare Other | Admitting: Occupational Therapy

## 2019-08-16 ENCOUNTER — Inpatient Hospital Stay (HOSPITAL_COMMUNITY): Payer: Medicare Other

## 2019-08-16 DIAGNOSIS — Z89611 Acquired absence of right leg above knee: Secondary | ICD-10-CM | POA: Diagnosis not present

## 2019-08-16 MED ORDER — TRAMADOL HCL 50 MG PO TABS
50.0000 mg | ORAL_TABLET | Freq: Four times a day (QID) | ORAL | Status: DC | PRN
  Filled 2019-08-16 (×2): qty 1

## 2019-08-16 MED ORDER — AZITHROMYCIN 250 MG PO TABS
250.0000 mg | ORAL_TABLET | Freq: Every day | ORAL | Status: DC
  Administered 2019-08-16 – 2019-08-20 (×5): 250 mg via ORAL
  Filled 2019-08-16 (×7): qty 1

## 2019-08-16 NOTE — Progress Notes (Signed)
Physical Therapy Session Note  Patient Details  Name: [name] MRN: [id number] Date of Birth: 10-04-1929  Today's Date: 08/16/2019 PT Individual Time: 0800-0826 PT Individual Time Calculation (min): 26 min   Short Term Goals: Week 1:  PT Short Term Goal 1 (Week 1): pt will perform bed mobility with supervision PT Short Term Goal 2 (Week 1): Pt will transfer sit<>stand with LRAD mod A PT Short Term Goal 3 (Week 1): Pt will transfer bed<>chair with LRAD min A  Skilled Therapeutic Interventions/Progress Updates:   Received pt supine in bed and stated "I'm just not doing well". When therapist asked why pt stated his night was restless and he is having more pain today. He reported pain 9/10 in R residual limb (premedicated) but RN made aware. Repositioning, rest breaks, and distraction done to reduce pain levels. Pt reported wanting to eat breakfast but stated he wasn't in a good position. Therapist suggested sitting EOB to eat and pt agreeable. Pt transferred supine<>sitting EOB with min handheld assist with HOB elevated and stated "I'm sorry, yesterday I could sit up and today I just can't." Session with emphasis on functional mobility/transfers, dynamic sitting balance, eating, and improved activity tolerance. Pt on 2L O2 via [place] throughout session with O2 sat >93% however pt did state "I feel like I'm about to take my last breath." RN notified of pt's current status. Pt worked on dynamic sitting balance eating breakfast with bilateral UE support and supervision for approximately 10-15 minutes. Therapist assisted with cutting french toast and applying syrup for time management. Pt able to fix coffee and continue eating with supervision. Pt stated "I'm feeling a little better now that I'm sitting up and eating." Pt ate approximately 90% of breakfast upon therapist exiting. Concluded session with pt sitting EOB finishing breakfast with RN Ed present at bedside administering medications and attending to  care.   Therapy Documentation Precautions:  Precautions Precautions: Fall Precaution Comments: R AKA Restrictions Weight Bearing Restrictions: Yes RLE Weight Bearing: Non weight bearing Other Position/Activity Restrictions: On 2L O2  Therapy/Group: Individual Therapy Alfonse Alpers PT, DPT   08/16/2019, 7:37 AM

## 2019-08-16 NOTE — Progress Notes (Signed)
Assisted pt on home Trilogy, 2L of 02 bleed in. No distress noted.

## 2019-08-16 NOTE — Progress Notes (Signed)
Occupational Therapy Session Note  Patient Details  Name: [name] MRN: [id number] Date of Birth: 1929/03/09  Today's Date: 08/16/2019 OT Individual Time: 4650-3546 and 1430-1500 OT Individual Time Calculation (min): 54 min and 30 min   Short Term Goals: Week 1:  OT Short Term Goal 1 (Week 1): Patient will complete 1/3 parts of LB dressing task seated EOB. OT Short Term Goal 2 (Week 1): Patient will complete LB bathing seated EOB with Min A. OT Short Term Goal 3 (Week 1): Patient will complete lateral scoot transfer to drop-arm BSC with Min A. OT Short Term Goal 4 (Week 1): Patient will complete toileting/hygiene/clothing management with Min A and lateral leans seated on BSC.  Skilled Therapeutic Interventions/Progress Updates:    1) Treatment session with focus on self-care retraining. Pt received seated EOB taking inhaler with wife at bedside. Pt reports having vivid dreams overnight and not sleeping well due to increased pain overnight.  Pt reports taking "norco" this AM and feeling completely out of it, but agreeable to attempting therapy as able. Pt required increased time with all tasks unable to alternate attention.  Therapist re-wrapped residual limb while educating on care for residual limb and figure 8 wrapping to limb shaping and to provide compression.  Pt declined bathing this AM but agreeable to donning his clothing.  Pt donned shirt seated EOB with CGA for sitting balance and donned pants with mod assist to thread BLE and then pt bridging at bed level to pull pants over hips. Pt reports extreme fatigue and requesting to remain supine in bed to rest.  Discussed current status with RN and difficulty with attention and sequencing this session.  Pt remained supine in bed with all needs in reach and wife at bedside.  2) Treatment session with focus on LB dressing, transfers, and sit <> stand to increase safety/independence with LB dressing. Pt received semi-reclined in bed with wife  and daughter present, much more alert.  Pt recalled therapist name and reports feeling much better this afternoon than AM session.  Pt completed bed mobility with min assist to maintain forward momentum as pt easily distracted by verbosity.  Pt completed lateral scoot transfer bed > w/c with mod assist fading to min assist.  Pt attempted to don sock and shoe on LLE however required assistance due to decreased sitting balance and fearfulness when reaching forward.  Engaged in sit > stand x2 with RW at sink side with use of mirror for visual feedback to increase upright standing posture.  Pt required min assist and mod multimodal cues for hand placement and sequencing during sit <> stand.  Pt initially reporting lightheadedness upon first stand but none reported during 2nd stand.  Encouraged pt to remain sitting upright in w/c until next therapy session; positioned with chair alarm on and all needs in reach with wife and daughter present.  Therapy Documentation Precautions:  Precautions Precautions: Fall Precaution Comments: R AKA Restrictions Weight Bearing Restrictions: Yes RLE Weight Bearing: Non weight bearing Other Position/Activity Restrictions: On 2L O2 General:   Vital Signs: Therapy Vitals Pulse Rate: 93 Resp: 18 BP: (!) 104/59 Patient Position (if appropriate): Sitting Oxygen Therapy SpO2: 98 % O2 Device: Room Air Pain:  Pt with no c/o pain in residual limb in AM or PM session.   Therapy/Group: Individual Therapy  [name] 08/16/2019, 3:18 PM

## 2019-08-16 NOTE — Progress Notes (Signed)
[place] PHYSICAL MEDICINE & REHABILITATION PROGRESS NOTE  Subjective/Complaints: The patient complains of stomach pain last night.  It was not relieved by Tylenol and took a stronger medication, hydrocodone which caused confusion and lethargy.  Both patient and wife are concerned about the side effects although he did stated help with the pain.  ROS:  Denies CP, SOB, N/V/D  Objective: Vital Signs: Blood pressure 120/68, pulse 80, temperature 98.1 F (36.7 C), temperature source Oral, resp. rate 16, height 5\' 9"  (1.753 m), weight 60 kg, SpO2 99 %. No results found. Recent Labs    08/14/19 0047 08/15/19 0554  WBC 11.2* 8.6  HGB 9.1* 8.8*  HCT 28.3* 27.2*  PLT 257 279   Recent Labs    08/14/19 0047 08/15/19 0554  NA 130* 129*  K 4.2 4.1  CL 93* 90*  CO2 30 30  GLUCOSE 92 93  BUN 18 18  CREATININE 0.89 0.99  CALCIUM 8.5* 8.6*    Physical Exam: BP 120/68   Pulse 80   Temp 98.1 F (36.7 C) (Oral)   Resp 16   Ht 5\' 9"  (1.753 m)   Wt 60 kg   SpO2 99%   BMI 19.53 kg/m   General: No acute distress Mood and affect are appropriate Heart: Regular rate and rhythm no rubs murmurs or extra sounds Lungs: Clear to auscultation, breathing unlabored, no rales or wheezes Abdomen: Positive bowel sounds, soft nontender to palpation, nondistended Extremities: No clubbing, cyanosis, or edema Skin: No evidence of breakdown, no evidence of rash   Motor: Bilateral upper extremities: 4/5 proximal distally  Left lower extremity: 4/5 proximal distal Right lower extremity: Flexion, knee extension 3+/5 (some pain patient)  Assessment/Plan: 1. Functional deficits secondary to right AKA which require 3+ hours per day of interdisciplinary therapy in a comprehensive inpatient rehab setting.  Physiatrist is providing close team supervision and 24 hour management of active medical problems listed below.  Physiatrist and rehab team continue to assess barriers to discharge/monitor patient  progress toward functional and medical goals  Care Tool:  Bathing    Body parts bathed by patient: Right arm, Left arm, Chest, Abdomen, Front perineal area, Face, Right upper leg   Body parts bathed by helper: Buttocks, Left upper leg, Left lower leg Body parts n/a: Right lower leg   Bathing assist Assist Level: Moderate Assistance - Patient 50 - 74%     Upper Body Dressing/Undressing Upper body dressing   What is the patient wearing?: Pull over shirt    Upper body assist Assist Level: Minimal Assistance - Patient > 75%    Lower Body Dressing/Undressing Lower body dressing      What is the patient wearing?: Underwear/pull up     Lower body assist Assist for lower body dressing: Moderate Assistance - Patient 50 - 74%     Toileting Toileting    Toileting assist Assist for toileting: Moderate Assistance - Patient 50 - 74%     Transfers Chair/bed transfer  Transfers assist     Chair/bed transfer assist level: Moderate Assistance - Patient 50 - 74%     Locomotion Ambulation   Ambulation assist   Ambulation activity did not occur: Safety/medical concerns (R AKA, generalized weakness, decreased balance/postural control, poor endurance, fear)          Walk 10 feet activity   Assist  Walk 10 feet activity did not occur: Safety/medical concerns (R AKA, generalized weakness, decreased balance/postural control, poor endurance, fear)  Walk 50 feet activity   Assist Walk 50 feet with 2 turns activity did not occur: Safety/medical concerns (R AKA, generalized weakness, decreased balance/postural control, poor endurance, fear)         Walk 150 feet activity   Assist Walk 150 feet activity did not occur: Safety/medical concerns (R AKA, generalized weakness, decreased balance/postural control, poor endurance, fear)         Walk 10 feet on uneven surface  activity   Assist Walk 10 feet on uneven surfaces activity did not occur: Safety/medical  concerns (R AKA, generalized weakness, decreased balance/postural control, poor endurance, fear)         Wheelchair     Assist Will patient use wheelchair at discharge?: Yes Type of Wheelchair: Manual    Wheelchair assist level: Supervision/Verbal cueing Max wheelchair distance: 37ft    Wheelchair 50 feet with 2 turns activity    Assist        Assist Level: Moderate Assistance - Patient 50 - 74%   Wheelchair 150 feet activity     Assist     Assist Level: Maximal Assistance - Patient 25 - 49%      Medical Problem List and Plan: 1.  Impaired mobility and ADLs secondary to right AKA  CIR PT, OT, SLP 2.  Antithrombotics: -DVT/anticoagulation:  Pharmaceutical: Heparin             -antiplatelet therapy: Aspirin 81mg  daily 3. Pain Management: Has residual limb pain--educate on desensitization techniques.   Norco will be discontinued.  Will trial tramadol instead for pain that is not relieved by acetaminophen  Monitor with increased exertion. 4. Mood: Appears depressed--team to provide ego support. LCSW to follow for evaluation and support.              -antipsychotic agents: N/A 5. Neuropsych: This patient may be capable of making decisions on his own behalf. Intermittently disoriented.  6. Skin/Wound Care:  Monitor wound for healing.  7. Fluids/Electrolytes/Nutrition: Monitor I/Os 8.COPD/chronic hypoxic respiratory failure: On chronic steriods, Symbicort BID and Incruse  Require supplemental oxygen at baseline 9. BPH/renal calculi:   Foley in place--has failed voiding trials x 2. Last changed 2 weeks ago.   Continue finasteride.   Plan to attempt voiding trial early next week 10. Anxiety d/o: continue Xanax tid prn. 11. Hyponatremia:   Sodium 129 on 8/6, labs ordered for Monday 12. ABLA:   8.8 on 8/6, labs ordered for Monday 13. Nasal congestion: Afrin nasal spray X 3 days.  Added saline spray as well as humidification.   14. HTN: Has history of  orthostatic hypotension.  Monitor BP   Continue lasix bid, aldactone 12.5/25 mgQOD and Toprol XL.   Monitor with increased exertion 15. CAD/Chronic systolic CHF: Monitor for symptoms with increase in activity. On lasix bid, aldactone, toprol XL, ASA  and Lipitor Filed Weights   08/15/19 0443 08/15/19 0451  Weight: 61.5 kg 60 kg  16.  Transaminitis  LFTs elevated on 8/6  Continue to monitor 17.  Severe hypoalbuminemia  Supplement initiated on 8/6 l 18.  Chronic sinusitis according to patient's wife has been seen at [facility] who recommended a 79-month course of azithromycin.  Will resume at 250 mg/day LOS: 2 days A FACE TO [place] Jerauld Bostwick 08/16/2019, 12:45 PM

## 2019-08-16 NOTE — Progress Notes (Signed)
Physical Therapy Session Note  Patient Details  Name: [name] MRN: [id number] Date of Birth: 12-24-1929  Today's Date: 08/16/2019 PT Individual Time: 1604-1700 PT Individual Time Calculation (min): 56 min   Short Term Goals: Week 1:  PT Short Term Goal 1 (Week 1): pt will perform bed mobility with supervision PT Short Term Goal 2 (Week 1): Pt will transfer sit<>stand with LRAD mod A PT Short Term Goal 3 (Week 1): Pt will transfer bed<>chair with LRAD min A  Skilled Therapeutic Interventions/Progress Updates:   Pt received sitting in w/c with his wife present and pt agreeable to therapy session with minimal encouragement despite reports of feeling tired. Pt states that he took a pain pill this morning and it was "like getting shot in the head with a 39" with his wife reporting it caused him to be confused and disoriented with pt stating "it cured the pain for a little while but it still hasn't worn off yet" (referring to the effects on his cognition). Pt received and maintained on 2L of O2 via nasal cannula throughout session - during sit<>stands monitored SpO2 with a reading of 89% 1x but with poor waveform then remainder of readings it was 97% or higher. B UE w/c propulsion ~88ft with 3x rest breaks as pt reports this easily makes him feel SOB - propels at very slow speed - pt reports armrests hit inside of his arms therefore flipped back out of the way for skin protection during this task - will need narrower w/c in future sessions. Therapist retrieved a regular RW, removed bariatric RW from room. Sit<>stand w/c<>RW x3 reps with light mod assist for lifting/lowering - pt requires more assist on descent - tolerates standing ~15seconds each rep with cuing to extend R hip backwards to avoid holding residual limb into hip flexed position and to provide a hip flexion stretch while activating glutes. Required prolonged therapeutic rest break between stands due to reports of SOB and to assess SpO2.  Transported back to room in w/c. L squat pivot w/c>EOB with mod assist for lifting and pivoting hips - pt able to fully clear hips from chair/bed. Sit>supine with min assist for L LE management onto bed. Pt left supine in bed with needs in reach, HOB partially elevated for improved respiratory function, lines intact, his wife present, and bed alarm on.  Therapy Documentation Precautions:  Precautions Precautions: Fall Precaution Comments: R AKA Restrictions Weight Bearing Restrictions: Yes RLE Weight Bearing: Non weight bearing Other Position/Activity Restrictions: On 2L O2  Pain:   Reports R LE residual limb pain - provided repositioning, rest breaks, and distraction for pain management.  Therapy/Group: Individual Therapy  Tawana Scale , PT, DPT, CSRS  08/16/2019, 3:58 PM

## 2019-08-17 ENCOUNTER — Inpatient Hospital Stay (HOSPITAL_COMMUNITY): Payer: Medicare Other

## 2019-08-17 DIAGNOSIS — Z89611 Acquired absence of right leg above knee: Secondary | ICD-10-CM | POA: Diagnosis not present

## 2019-08-17 MED ORDER — LEVALBUTEROL HCL 0.63 MG/3ML IN NEBU
0.6300 mg | INHALATION_SOLUTION | Freq: Four times a day (QID) | RESPIRATORY_TRACT | Status: DC | PRN
  Administered 2019-08-18: 0.63 mg via RESPIRATORY_TRACT
  Filled 2019-08-17 (×2): qty 3

## 2019-08-17 NOTE — Progress Notes (Signed)
Orthopedic Tech Progress Note Patient Details:  [name] [birth year] [id number] Called in brace Patient ID: [name], male   DOB: 1929-12-29, 86 y.o.   MRN: [id number]   [name] 08/17/2019, 11:17 AM

## 2019-08-17 NOTE — Progress Notes (Signed)
+/-   sleep. Dressing changed to right AKA, small amount of drainage on old dressing. Staples in place. Bruising noted to posterior aspect of  Incision. Foam dressing to left heel and sacrum. Left heel elevated off bed. 2-3 plus pitting edema to LLE, mostly to foot and ankle. PRN tylenol & xanax given at 0037. At 0503, PRN tylenol given. 02 at 2L/m via [place]. Uses CPAP at night. Foley patent. Patrici Ranks A

## 2019-08-17 NOTE — Progress Notes (Signed)
Spoke with [name] and his wife regarding his code status. Pt would like to rescind the DNR status and become a limited code. I reviewed in detail what he would like done in the event of cardiac/respiratory failure. Pt does not want to be intubated or placed on mechanical ventilation. He would like all medications, CPR, and use of bipap/cpap as appropriate for situation. Dr. Letta Pate called and order obtained.

## 2019-08-17 NOTE — Progress Notes (Signed)
[place] PHYSICAL MEDICINE & REHABILITATION PROGRESS NOTE  Subjective/Complaints: No abdominal discomfort, the patient did have more discomfort in the right stump as well as some burning pain right heel, phantom pain  ROS:  Denies CP, SOB, N/V/D  Objective: Vital Signs: Blood pressure 118/68, pulse 78, temperature 98.4 F (36.9 C), resp. rate 16, height 5\' 9"  (1.753 m), weight 55.8 kg, SpO2 99 %. No results found. Recent Labs    08/15/19 0554  WBC 8.6  HGB 8.8*  HCT 27.2*  PLT 279   Recent Labs    08/15/19 0554  NA 129*  K 4.1  CL 90*  CO2 30  GLUCOSE 93  BUN 18  CREATININE 0.99  CALCIUM 8.6*    Physical Exam: BP 118/68   Pulse 78   Temp 98.4 F (36.9 C)   Resp 16   Ht 5\' 9"  (1.753 m)   Wt 55.8 kg   SpO2 99%   BMI 18.17 kg/m   General: No acute distress Mood and affect are appropriate Heart: Regular rate and rhythm no rubs murmurs or extra sounds Lungs: Clear to auscultation, breathing unlabored, no rales or wheezes Abdomen: Positive bowel sounds, soft nontender to palpation, nondistended Extremities: No clubbing, cyanosis, or edema Skin: No evidence of breakdown,  right AKA incision well approximated staples in place small amount of bleeding mild to moderate edema, no tenderness palpation no drainage Motor: Bilateral upper extremities: 4/5 proximal distally  Left lower extremity: 4/5 proximal distal Right lower extremity: Flexion, knee extension 3+/5 (some pain patient)  Assessment/Plan: 1. Functional deficits secondary to right AKA which require 3+ hours per day of interdisciplinary therapy in a comprehensive inpatient rehab setting.  Physiatrist is providing close team supervision and 24 hour management of active medical problems listed below.  Physiatrist and rehab team continue to assess barriers to discharge/monitor patient progress toward functional and medical goals  Care Tool:  Bathing  Bathing activity did not occur: Refused Body parts  bathed by patient: Right arm, Left arm, Chest, Abdomen, Front perineal area, Face, Right upper leg   Body parts bathed by helper: Buttocks, Left upper leg, Left lower leg Body parts n/a: Right lower leg   Bathing assist Assist Level: Moderate Assistance - Patient 50 - 74%     Upper Body Dressing/Undressing Upper body dressing   What is the patient wearing?: Pull over shirt    Upper body assist Assist Level: Contact Guard/Touching assist    Lower Body Dressing/Undressing Lower body dressing      What is the patient wearing?: Pants     Lower body assist Assist for lower body dressing: Moderate Assistance - Patient 50 - 74%     Toileting Toileting    Toileting assist Assist for toileting: Moderate Assistance - Patient 50 - 74%     Transfers Chair/bed transfer  Transfers assist     Chair/bed transfer assist level: Moderate Assistance - Patient 50 - 74% (squat pivot)     Locomotion Ambulation   Ambulation assist   Ambulation activity did not occur: Safety/medical concerns (R AKA, generalized weakness, decreased balance/postural control, poor endurance, fear)          Walk 10 feet activity   Assist  Walk 10 feet activity did not occur: Safety/medical concerns (R AKA, generalized weakness, decreased balance/postural control, poor endurance, fear)        Walk 50 feet activity   Assist Walk 50 feet with 2 turns activity did not occur: Safety/medical concerns (R AKA, generalized weakness,  decreased balance/postural control, poor endurance, fear)         Walk 150 feet activity   Assist Walk 150 feet activity did not occur: Safety/medical concerns (R AKA, generalized weakness, decreased balance/postural control, poor endurance, fear)         Walk 10 feet on uneven surface  activity   Assist Walk 10 feet on uneven surfaces activity did not occur: Safety/medical concerns (R AKA, generalized weakness, decreased balance/postural control, poor  endurance, fear)         Wheelchair     Assist Will patient use wheelchair at discharge?: Yes Type of Wheelchair: Manual    Wheelchair assist level: Supervision/Verbal cueing Max wheelchair distance: 67ft    Wheelchair 50 feet with 2 turns activity    Assist        Assist Level: Moderate Assistance - Patient 50 - 74%   Wheelchair 150 feet activity     Assist     Assist Level: Maximal Assistance - Patient 25 - 49%      Medical Problem List and Plan: 1.  Impaired mobility and ADLs secondary to right AKA  CIR PT, OT, SLP 2.  Antithrombotics: -DVT/anticoagulation:  Pharmaceutical: Heparin             -antiplatelet therapy: Aspirin 81mg  daily 3. Pain Management: Has residual limb pain--educate on desensitization techniques.   Norco will be discontinued.  Will trial tramadol instead for pain that is not relieved by acetaminophen  Monitor with increased exertion. 4. Mood: Appears depressed--team to provide ego support. LCSW to follow for evaluation and support.              -antipsychotic agents: N/A 5. Neuropsych: This patient may be capable of making decisions on his own behalf. Intermittently disoriented.  6. Skin/Wound Care:  Monitor wound for healing.  Will order stump shrinker for edema control but based on his age and comorbidities not a good prosthetic candidate 7. Fluids/Electrolytes/Nutrition: Monitor I/Os 8.COPD/chronic hypoxic respiratory failure: On chronic steriods, Symbicort BID and Incruse  Require supplemental oxygen at baseline 9. BPH/renal calculi:   Foley in place--has failed voiding trials x 2. Last changed 2 weeks ago.   Continue finasteride.   Plan to attempt voiding trial early next week 10. Anxiety d/o: continue Xanax tid prn. 11. Hyponatremia:   Sodium 129 on 8/6, labs ordered for Monday 12. ABLA:   8.8 on 8/6, labs ordered for Monday 13. Nasal congestion: Afrin nasal spray X 3 days.  Added saline spray as well as humidification.    14. HTN: Has history of orthostatic hypotension.  Monitor BP   Continue lasix bid, aldactone 12.5/25 mgQOD and Toprol XL.   Monitor with increased exertion 15. CAD/Chronic systolic CHF: Monitor for symptoms with increase in activity. On lasix bid, aldactone, toprol XL, ASA  and Lipitor Filed Weights   08/15/19 0443 08/15/19 0451 08/17/19 0500  Weight: 61.5 kg 60 kg 55.8 kg  16.  Transaminitis  LFTs elevated on 8/6  Continue to monitor 17.  Severe hypoalbuminemia  Supplement initiated on 8/6 l 18.  Chronic sinusitis according to patient's wife has been seen at [facility] who recommended a 75-month course of azithromycin.  Will resume at 250 mg/day LOS: 3 days A FACE TO [place] E Herley Bernardini 08/17/2019, 11:06 AM

## 2019-08-18 ENCOUNTER — Inpatient Hospital Stay (HOSPITAL_COMMUNITY): Payer: Medicare Other

## 2019-08-18 ENCOUNTER — Inpatient Hospital Stay (HOSPITAL_COMMUNITY): Payer: Medicare Other | Admitting: Occupational Therapy

## 2019-08-18 DIAGNOSIS — D62 Acute posthemorrhagic anemia: Secondary | ICD-10-CM | POA: Diagnosis not present

## 2019-08-18 DIAGNOSIS — G8918 Other acute postprocedural pain: Secondary | ICD-10-CM | POA: Diagnosis not present

## 2019-08-18 DIAGNOSIS — E871 Hypo-osmolality and hyponatremia: Secondary | ICD-10-CM | POA: Diagnosis not present

## 2019-08-18 DIAGNOSIS — Z89611 Acquired absence of right leg above knee: Secondary | ICD-10-CM | POA: Diagnosis not present

## 2019-08-18 DIAGNOSIS — R0602 Shortness of breath: Secondary | ICD-10-CM

## 2019-08-18 DIAGNOSIS — I5022 Chronic systolic (congestive) heart failure: Secondary | ICD-10-CM

## 2019-08-18 LAB — BASIC METABOLIC PANEL
Anion gap: 9 (ref 5–15)
BUN: 19 mg/dL (ref 8–23)
CO2: 31 mmol/L (ref 22–32)
Calcium: 8.6 mg/dL — ABNORMAL LOW (ref 8.9–10.3)
Chloride: 85 mmol/L — ABNORMAL LOW (ref 98–111)
Creatinine, Ser: 1.08 mg/dL (ref 0.61–1.24)
GFR calc Af Amer: >60 mL/min (ref >60)
GFR calc non Af Amer: >60 mL/min (ref >60)
Glucose, Bld: 94 mg/dL (ref 70–99)
Potassium: 4 mmol/L (ref 3.5–5.1)
Sodium: 125 mmol/L — ABNORMAL LOW (ref 135–145)

## 2019-08-18 LAB — CBC
HCT: 27.2 % — ABNORMAL LOW (ref 39.0–52.0)
Hemoglobin: 9 g/dL — ABNORMAL LOW (ref 13.0–17.0)
MCH: 31.5 pg (ref 26.0–34.0)
MCHC: 33.1 g/dL (ref 30.0–36.0)
MCV: 95.1 fL (ref 80.0–100.0)
Platelets: 322 10*3/uL (ref 150–400)
RBC: 2.86 MIL/uL — ABNORMAL LOW (ref 4.22–5.81)
RDW: 12.4 % (ref 11.5–15.5)
WBC: 8.5 10*3/uL (ref 4.0–10.5)
nRBC: 0 % (ref 0.0–0.2)

## 2019-08-18 MED ORDER — FUROSEMIDE 20 MG PO TABS
20.0000 mg | ORAL_TABLET | Freq: Every day | ORAL | Status: DC
  Administered 2019-08-19: 20 mg via ORAL
  Filled 2019-08-18: qty 1

## 2019-08-18 MED ORDER — LIDOCAINE 5 % EX PTCH
1.0000 | MEDICATED_PATCH | CUTANEOUS | Status: DC
  Administered 2019-08-18 – 2019-08-20 (×3): 1 via TRANSDERMAL
  Filled 2019-08-18 (×3): qty 1

## 2019-08-18 NOTE — Progress Notes (Signed)
[facility] PHYSICAL MEDICINE & REHABILITATION PROGRESS NOTE  Subjective/Complaints: Patient seen sitting up in bed this morning.  He states he does not believe he slept well overnight.  He notes shortness of breath this morning, but refused to take his inhalers.  He complains of left medial heel pain.  ROS: + Shortness of breath.  Denies CP, N/V/D  Objective: Vital Signs: Blood pressure 119/64, pulse 90, temperature 98.2 F (36.8 C), temperature source Oral, resp. rate 18, height 5\' 9"  (1.753 m), weight 55.7 kg, SpO2 99 %. No results found. Recent Labs    08/18/19 0438  WBC 8.5  HGB 9.0*  HCT 27.2*  PLT 322   Recent Labs    08/18/19 0438  NA 125*  K 4.0  CL 85*  CO2 31  GLUCOSE 94  BUN 19  CREATININE 1.08  CALCIUM 8.6*    Physical Exam: BP 119/64   Pulse 90   Temp 98.2 F (36.8 C) (Oral)   Resp 18   Ht 5\' 9"  (1.753 m)   Wt 55.7 kg   SpO2 99%   BMI 18.13 kg/m  Constitutional: No distress . Vital signs reviewed. HENT: Normocephalic.  Atraumatic. Eyes: EOMI. No discharge. Cardiovascular: No JVD. ?Irregular. Respiratory: Increased work of breathing.  No stridor.  Decreased air movement GI: Non-distended. Skin: Right AKA with dressing CDI Psych: Normal mood.  Normal behavior. Musc: Left AKA with edema and tenderness. Neuro: Alert Extreme HOH Motor: Bilateral upper extremities: 4/5 proximal distally  Left lower extremity: 3/5 proximal distal Right lower extremity: Hip flexion, knee extension 3+/5 (some pain patient)  Assessment/Plan: 1. Functional deficits secondary to right AKA which require 3+ hours per day of interdisciplinary therapy in a comprehensive inpatient rehab setting.  Physiatrist is providing close team supervision and 24 hour management of active medical problems listed below.  Physiatrist and rehab team continue to assess barriers to discharge/monitor patient progress toward functional and medical goals  Care Tool:  Bathing  Bathing  activity did not occur: Refused Body parts bathed by patient: Right arm, Left arm, Chest, Abdomen, Front perineal area, Face, Right upper leg   Body parts bathed by helper: Buttocks, Left upper leg, Left lower leg Body parts n/a: Right lower leg   Bathing assist Assist Level: Moderate Assistance - Patient 50 - 74%     Upper Body Dressing/Undressing Upper body dressing   What is the patient wearing?: Pull over shirt    Upper body assist Assist Level: Contact Guard/Touching assist    Lower Body Dressing/Undressing Lower body dressing      What is the patient wearing?: Pants     Lower body assist Assist for lower body dressing: Moderate Assistance - Patient 50 - 74%     Toileting Toileting    Toileting assist Assist for toileting: Moderate Assistance - Patient 50 - 74%     Transfers Chair/bed transfer  Transfers assist     Chair/bed transfer assist level: Moderate Assistance - Patient 50 - 74%     Locomotion Ambulation   Ambulation assist   Ambulation activity did not occur: Safety/medical concerns (R AKA, generalized weakness, decreased balance/postural control, poor endurance, fear)          Walk 10 feet activity   Assist  Walk 10 feet activity did not occur: Safety/medical concerns (R AKA, generalized weakness, decreased balance/postural control, poor endurance, fear)        Walk 50 feet activity   Assist Walk 50 feet with 2 turns activity did not  occur: Safety/medical concerns (R AKA, generalized weakness, decreased balance/postural control, poor endurance, fear)         Walk 150 feet activity   Assist Walk 150 feet activity did not occur: Safety/medical concerns (R AKA, generalized weakness, decreased balance/postural control, poor endurance, fear)         Walk 10 feet on uneven surface  activity   Assist Walk 10 feet on uneven surfaces activity did not occur: Safety/medical concerns (R AKA, generalized weakness, decreased  balance/postural control, poor endurance, fear)         Wheelchair     Assist Will patient use wheelchair at discharge?: Yes Type of Wheelchair: Manual    Wheelchair assist level: Supervision/Verbal cueing Max wheelchair distance: 18ft    Wheelchair 50 feet with 2 turns activity    Assist        Assist Level: Moderate Assistance - Patient 50 - 74%   Wheelchair 150 feet activity     Assist     Assist Level: Maximal Assistance - Patient 25 - 49%      Medical Problem List and Plan: 1.  Impaired mobility and ADLs secondary to right AKA  Continue CIR 2.  Antithrombotics: -DVT/anticoagulation:  Pharmaceutical: Heparin             -antiplatelet therapy: Aspirin 81mg  daily 3. Pain Management: Has residual limb pain--educate on desensitization techniques.   Norco will be discontinued.  Continue tramadol   Lidoderm patch added for left heel on 8/9  Monitor with increased exertion. 4. Mood: Appears depressed--team to provide ego support. LCSW to follow for evaluation and support.              -antipsychotic agents: N/A 5. Neuropsych: This patient may be capable of making decisions on his own behalf. Intermittently disoriented.  6. Skin/Wound Care:  Monitor wound for healing.    Stump shrinker for edema control  7. Fluids/Electrolytes/Nutrition: Monitor I/Os 8.COPD/chronic hypoxic respiratory failure: On chronic steriods, Symbicort BID and Incruse-encourage compliance.  Continues to require supplemental oxygen (baseline)  Chest x-ray ordered  ECG ordered 9. BPH/renal calculi:   Foley in place--has failed voiding trials x 2. Last changed 2 weeks ago.   Continue finasteride.   Plan to attempt voiding trial this week 10. Anxiety d/o: continue Xanax tid prn. 11. Hyponatremia:   Sodium 125 on 8/9, labs ordered for tomorrow  See #15 12. ABLA:   Hemoglobin 9.0 on 8/9 13. Nasal congestion: Afrin nasal spray X 3 days.  Added saline spray as well as humidification.    14. HTN: Has history of orthostatic hypotension.  Monitor BP   Continue lasix bid, aldactone 12.5/25 mgQOD and Toprol XL.   Controlled on 8/9  Monitor with increased exertion 15. CAD/Chronic systolic CHF: Monitor for symptoms with increase in activity. On aldactone, toprol XL, ASA  and Lipitor Filed Weights   08/15/19 0451 08/17/19 0500 08/18/19 0507  Weight: 60 kg 55.8 kg 55.7 kg   Lasix changed to daily on 8/9 secondary to hyponatremia  Stable on 8/9 16.  Transaminitis  LFTs elevated on 8/6  Continue to monitor 17.  Severe hypoalbuminemia  Supplement initiated on 8/6 18.  Chronic sinusitis according to patient's wife has been seen at [facility] who recommended a 58-month course of azithromycin.    Resumed at 250 mg/day  LOS: 4 days A FACE TO FACE EVALUATION WAS PERFORMED  Romonia Yanik Lorie Phenix 08/18/2019, 8:47 AM

## 2019-08-18 NOTE — Progress Notes (Signed)
Pt has a fluid filled area on the medial portion of his lower left arm. Pt denies any pains to the area. Area is not warm to touch. PA notified. Amanda Cockayne, LPN

## 2019-08-18 NOTE — Plan of Care (Signed)
  Problem: Consults Goal: RH LIMB LOSS PATIENT EDUCATION Description: Description: See Patient Education module for eduction specifics. Outcome: Progressing Goal: Skin Care Protocol Initiated - if Braden Score 18 or less Description: If consults are not indicated, leave blank or document N/A Outcome: Progressing Goal: Nutrition Consult-if indicated Outcome: Progressing   Problem: RH BOWEL ELIMINATION Goal: RH STG MANAGE BOWEL WITH ASSISTANCE Description: STG Manage Bowel with mod Assistance. Outcome: Progressing Goal: RH STG MANAGE BOWEL W/MEDICATION W/ASSISTANCE Description: STG Manage Bowel with Medication with mod Assistance. Outcome: Progressing   Problem: RH BLADDER ELIMINATION Goal: RH STG MANAGE BLADDER WITH ASSISTANCE Description: STG Manage Bladder With  mod Assistance Outcome: Progressing Goal: RH STG MANAGE BLADDER WITH MEDICATION WITH ASSISTANCE Description: STG Manage Bladder With Medication With mod Assistance. Outcome: Progressing Goal: RH STG MANAGE BLADDER WITH EQUIPMENT WITH ASSISTANCE Description: STG Manage Bladder With Equipment With mod  Assistance Outcome: Progressing   Problem: RH SKIN INTEGRITY Goal: RH STG SKIN FREE OF INFECTION/BREAKDOWN Description: Pt will be aware of care of wound and infection prevention measures with mod assist use of air mattress Outcome: Progressing Goal: RH STG MAINTAIN SKIN INTEGRITY WITH ASSISTANCE Description: STG Maintain Skin Integrity With mod Assistance. Outcome: Progressing Goal: RH STG ABLE TO PERFORM INCISION/WOUND CARE W/ASSISTANCE Description: STG Able To Perform Incision/Wound Care With Mod Assistance. Outcome: Progressing   Problem: RH SAFETY Goal: RH STG ADHERE TO SAFETY PRECAUTIONS W/ASSISTANCE/DEVICE Description: STG Adhere to Safety Precautions With mod  Assistance/Devices as recommended by therapy Outcome: Progressing   Problem: RH PAIN MANAGEMENT Goal: RH STG PAIN MANAGED AT OR BELOW PT'S PAIN  GOAL Description: Pain <=4/10 Outcome: Progressing

## 2019-08-18 NOTE — Progress Notes (Signed)
Offered patient his spiriva and symbicort inhalers.  He refused and said he will take them himself later in the morning.

## 2019-08-18 NOTE — Progress Notes (Signed)
Reported to nurse from therapy that patients wife states "he is lethargic". Orientation questions have been answered. Pt was able to answer all 4 questions. Vs have been taken, results are as followed: T- 99.4. BP-130/86, MAP-97, P-76, R-16, SPo2-100 on 2L of O2.Amanda Cockayne, LPN

## 2019-08-18 NOTE — Progress Notes (Signed)
Patient ID: [name], male   DOB: 08-20-1929, 86 y.o.   MRN: [id number] Met with patient and wife to review role of CM and collaboration with SW Margreta Journey) to facilitate prep for discharge. Reviewed access to home as wife was asking about stair lift. Reviewed 9 steps from entry to kitchen. No steps from carport to basement however there is no bathroom/bedroom in the basement. Anticipate need for hospital bed at discharge and [facility]. Given info on stair lifts and broached subject of ramp to entry of home however wife feels family can set up something in the basement for the patient. Already has home o2 set up. Patient is very sleepy and noted edema of left UE. Wife reported history of edema of lower extremities. Also noted patient have second thoughts about his decision the have surgery. Reviewed referral to Dr. Sima Matas for coping, depression, etc. Wife requested information on [facility], PCS and SNF and given handouts on the information requested. To let SW know of agency preference after review of the materials. Continue to monitor for nursing educational needs and team conference scheduled for Wednesdays.

## 2019-08-18 NOTE — Progress Notes (Signed)
Pt refuse to wear home trilogy for bipap tonight. He is currently on 3L of humidified oxygen.

## 2019-08-18 NOTE — Progress Notes (Signed)
Physical Therapy Session Note  Patient Details  Name: [name] MRN: [id number] Date of Birth: November 26, 1929  Today's Date: 08/18/2019 PT Individual Time: 1000-1100 PT Individual Time Calculation (min): 60 min   Short Term Goals: Week 1:  PT Short Term Goal 1 (Week 1): pt will perform bed mobility with supervision PT Short Term Goal 2 (Week 1): Pt will transfer sit<>stand with LRAD mod A PT Short Term Goal 3 (Week 1): Pt will transfer bed<>chair with LRAD min A Week 2:     Skilled Therapeutic Interventions/Progress Updates:    PAIN only c/o pain during application of shrinker, care taken w/dressing change/shrinker application.  Pt initially supine and states "I don't know whats wrong with me, I think I might be dying".  Wife at bedside and concerned.  Pt did not appear in apparent distress but somewhat somulent. Does not appear to be thinking clearly/not fully awake.  02 sats 96% on 2L o2 via [place], maintained throughout session.  Nurse  Contacted and stated pt had beed thoroughly assessed by medical staff and ok for rx.  Stated tends to have increased symptoms when wife present.    Returned to session and wife stating pt wanted to use BSC.  Pt supine to sit w/mod assist and scoots in sitting w/min to mod assist and cues.  Pt continues to c/o "I don't know what is wrong with me.  I was doing so well....all mobility/transitions/transfers require significant rest breaks and additional time to perform/complete. Pt sat at edge of bed w/cga for several min before performing STS w/RW, first effort unable to achieve upright w/mod to max assist, pt self limiting due to fear of falling.  Repeated w/min assist and cues w/bed elevated x 2 reps.  Pt then performed SPT to [name] w/3 lateral steps using UEs/RW and min assist, max cues for safety and management of walker.  Pt then STS from commode w/max cues and min assist, total asssist for removal of mesh brief. Pt continent of bowels on BSC, STS w/RW w/max cues  for hand placement, mod assist for transition.  Once upright stood w/min assist and dependent for pericare.  SPT BSC to wc w/mod assist as above using RW.  Pt asking wife to provide "rescue inhaler", stated he had 7:30 and could use every 4 hrs.  Explained that it ws 10:30 at time and nursing would need to be contacted to discuss.  Pt also requested symbicort.  Due to current mental status, wife/pt educated therapist needed to discuss w/nursing to ensure meds were provided in timely/safe manner.  Nursing contacted to discuss w/pt.  Pt also noted to have vitamin B12 supplements in dresser.    During transfers, Dressing/ace/compression sock fell off of pt, therefore, therapist performed dressing change/no drainage from wound and applied compression sock.  Pt then dependent for donning underwear, pants in sitting to midthighs, STS w/mod assist w/RW and static stand w/min assist while therapist raised underwear and pants?dependent. Pt repositioned for comfort. Pt left oob in wc w/alarm belt set and needs in reach, wife at bedside.  Pt more alert, no longer c/o issues as above. Therapy Documentation Precautions:  Precautions Precautions: Fall Precaution Comments: R AKA Restrictions Weight Bearing Restrictions: Yes RLE Weight Bearing: Non weight bearing Other Position/Activity Restrictions: On 2L O2    Therapy/Group: Individual Therapy  [name], PT   [name] 08/18/2019, 2:37 PM

## 2019-08-18 NOTE — Progress Notes (Signed)
Physical Therapy Session Note  Patient Details  Name: [name] MRN: [id number] Date of Birth: [birth year]  Today's Date: 08/18/2019 PT Individual Time: 1103-1594 PT Individual Time Calculation (min): 55 min   Short Term Goals: Week 1:  PT Short Term Goal 1 (Week 1): pt will perform bed mobility with supervision PT Short Term Goal 2 (Week 1): Pt will transfer sit<>stand with LRAD mod A PT Short Term Goal 3 (Week 1): Pt will transfer bed<>chair with LRAD min A  Skilled Therapeutic Interventions/Progress Updates:   Received pt sitting in WC, pt agreeable to therapy, and denied any pain at rest but reported feeling extremely lethargic and frequently falling asleep throughout session (RN made aware). Session with emphasis on functional mobility/transfers, toileting, UE strengthening, dynamic sitting/standing balance/coordination, and improved activity tolerance. O2 sat 98% and HR 98bpm on 2L O2 via [place] at rest sitting in WC. Pt reported urge to have BM and transferred stand<>pivot WC<>bedside commode with RW and mod A with cues for turning technqiue. Pt required total A to doff pants/underwear and able to have medium and loose BM. Pt transferred sit<>stand with RW mod A and required total A for peri-care and to pull pants/underwear over hips. Stand<>pivot bedside commode<>WC mod A with cues for pivoting technique. Attempted to locate smaller WC however not available and pt too fatigued to perform additional transfer into new WC. Therapist suggested leaving room for improved OOB activity tolerance and pt stated "okay as long as I don't have to do anything". Pt transported dependently in [place] around therapy gym. Pt transferred WC<>bed stand<>pivot with RW mod A with cues for turning technique and safety. Doffed shoe total A and transferred sit<>supine with mod A for L LE maagement due to fatigue. Pt scooted to L with supervision. Concluded session with pt supine in bed, needs within reach, and bed alarm on.  [name] present at bedside. O2 sat 95% and HR 98bpm at end of session.   Therapy Documentation Precautions:  Precautions Precautions: Fall Precaution Comments: R AKA Restrictions Weight Bearing Restrictions: Yes RLE Weight Bearing: Non weight bearing Other Position/Activity Restrictions: On 2L O2  Therapy/Group: Individual Therapy Alfonse Alpers PT, DPT   08/18/2019, 7:18 AM

## 2019-08-18 NOTE — Progress Notes (Signed)
Occupational Therapy Session Note  Patient Details  Name: [name] MRN: [id number] Date of Birth: 02-12-1929  Today's Date: 08/18/2019 OT Individual Time: 7989-2119 OT Individual Time Calculation (min): 39 min  and Today's Date: 08/18/2019 OT Missed Time: 36 Minutes Missed Time Reason: Patient fatigue   Short Term Goals: Week 1:  OT Short Term Goal 1 (Week 1): Patient will complete 1/3 parts of LB dressing task seated EOB. OT Short Term Goal 2 (Week 1): Patient will complete LB bathing seated EOB with Min A. OT Short Term Goal 3 (Week 1): Patient will complete lateral scoot transfer to drop-arm BSC with Min A. OT Short Term Goal 4 (Week 1): Patient will complete toileting/hygiene/clothing management with Min A and lateral leans seated on BSC.  Skilled Therapeutic Interventions/Progress Updates:    Treatment session with focus on activity tolerance and participation in self-care retraining tasks.  Pt received upright in bed working on breakfast and typing on his phone  Pt reports "trying to catch up on everything this morning".  Pt requested time to complete logging his medications and finish breakfast.  After completing yogurt pt stated, "I can't tell if I'm falling asleep or passing out."  Assessed vitals BP 98/63 HR 98 O2 99% on 2L O2 via nasal canula.  Pt reports feeling like he may not be getting enough oxygen despite sat level.  Discussed OT POC and encouraged participation with pt continuing to report feeling "off".  Notified RN and requested therapies to schedule later in the morning for future sessions as morning seems to be more challenging for pt.  Pt missed remainder of session due to fatigue and difficulty engaging in therapy session.  Therapy Documentation Precautions:  Precautions Precautions: Fall Precaution Comments: R AKA Restrictions Weight Bearing Restrictions: Yes RLE Weight Bearing: Non weight bearing Other Position/Activity Restrictions: On 2L  O2 General: General OT Amount of Missed Time: 36 Minutes Vital Signs: Therapy Vitals Temp: 98.2 F (36.8 C) Temp Source: Oral Pulse Rate: 90 Resp: 18 BP: 119/64 Patient Position (if appropriate): Lying Oxygen Therapy SpO2: 99 % O2 Device: Bi-PAP Pain:  Pt with no c/o pain  Therapy/Group: Individual Therapy  [name] 08/18/2019, 8:58 AM

## 2019-08-18 NOTE — Consult Note (Signed)
   [facility] Inpatient Consult   08/18/2019  [name] 05/06/1929 [id number]  [facility] Patient:  Active- Currently in Rehab Patient is currently active with [facility] for chronic disease management services.  Patient has been engaged by a [facility] Coordinator.  Our community based plan of care has focused on disease management and community resource support.    Chart review reveals patient is currently in [facility] rehab center.   Plan:  Will alert [facility] Coordinator of his transition to CIR.   Of note, [facility] Care Management services does not replace or interfere with any services that are needed or arranged by inpatient [facility] care management team.  For additional questions or referrals please contact:   Natividad Brood, RN BSN [facility] Liaison  [phone] business mobile phone Toll free office [phone]  Fax number: [phone] [email or web address]

## 2019-08-18 NOTE — Progress Notes (Signed)
At [place], anxious, feeling SOB, PRN xopenx neb treatment given with some relief. PRN tylenol given at 2158 and 0221. PRN xanax 0.25mg 's given at 2158. Dressing changed to right AKA, minimal amount of serosanguinous drainage noted on old dressing. Assisted with placing patient on Bipap. [name] A

## 2019-08-19 ENCOUNTER — Inpatient Hospital Stay (HOSPITAL_COMMUNITY): Payer: Medicare Other

## 2019-08-19 ENCOUNTER — Inpatient Hospital Stay (HOSPITAL_COMMUNITY): Payer: Medicare Other | Admitting: Occupational Therapy

## 2019-08-19 ENCOUNTER — Ambulatory Visit: Payer: Medicare Other | Admitting: Internal Medicine

## 2019-08-19 DIAGNOSIS — J9611 Chronic respiratory failure with hypoxia: Secondary | ICD-10-CM | POA: Diagnosis not present

## 2019-08-19 DIAGNOSIS — Z89611 Acquired absence of right leg above knee: Secondary | ICD-10-CM | POA: Diagnosis not present

## 2019-08-19 DIAGNOSIS — J432 Centrilobular emphysema: Secondary | ICD-10-CM

## 2019-08-19 DIAGNOSIS — D62 Acute posthemorrhagic anemia: Secondary | ICD-10-CM | POA: Diagnosis not present

## 2019-08-19 DIAGNOSIS — J9612 Chronic respiratory failure with hypercapnia: Secondary | ICD-10-CM | POA: Diagnosis not present

## 2019-08-19 DIAGNOSIS — E8809 Other disorders of plasma-protein metabolism, not elsewhere classified: Secondary | ICD-10-CM | POA: Diagnosis not present

## 2019-08-19 DIAGNOSIS — J449 Chronic obstructive pulmonary disease, unspecified: Secondary | ICD-10-CM

## 2019-08-19 DIAGNOSIS — I5022 Chronic systolic (congestive) heart failure: Secondary | ICD-10-CM | POA: Diagnosis not present

## 2019-08-19 LAB — URINALYSIS, COMPLETE (UACMP) WITH MICROSCOPIC
Bilirubin Urine: NEGATIVE
Glucose, UA: NEGATIVE mg/dL
Ketones, ur: NEGATIVE mg/dL
Nitrite: NEGATIVE
Protein, ur: NEGATIVE mg/dL
RBC / HPF: >50 RBC/hpf — ABNORMAL HIGH (ref 0–5)
Specific Gravity, Urine: 1.014 (ref 1.005–1.030)
WBC, UA: >50 WBC/hpf — ABNORMAL HIGH (ref 0–5)
pH: 6 (ref 5.0–8.0)

## 2019-08-19 LAB — BASIC METABOLIC PANEL
Anion gap: 7 (ref 5–15)
Anion gap: 7 (ref 5–15)
BUN: 17 mg/dL (ref 8–23)
BUN: 18 mg/dL (ref 8–23)
CO2: 33 mmol/L — ABNORMAL HIGH (ref 22–32)
CO2: 30 mmol/L (ref 22–32)
Calcium: 9 mg/dL (ref 8.9–10.3)
Calcium: 8.5 mg/dL — ABNORMAL LOW (ref 8.9–10.3)
Chloride: 83 mmol/L — ABNORMAL LOW (ref 98–111)
Chloride: 82 mmol/L — ABNORMAL LOW (ref 98–111)
Creatinine, Ser: 0.87 mg/dL (ref 0.61–1.24)
Creatinine, Ser: 0.81 mg/dL (ref 0.61–1.24)
GFR calc Af Amer: >60 mL/min (ref >60)
GFR calc Af Amer: >60 mL/min (ref >60)
GFR calc non Af Amer: >60 mL/min (ref >60)
GFR calc non Af Amer: >60 mL/min (ref >60)
Glucose, Bld: 91 mg/dL (ref 70–99)
Glucose, Bld: 110 mg/dL — ABNORMAL HIGH (ref 70–99)
Potassium: 4.2 mmol/L (ref 3.5–5.1)
Potassium: 4.7 mmol/L (ref 3.5–5.1)
Sodium: 123 mmol/L — ABNORMAL LOW (ref 135–145)
Sodium: 119 mmol/L — CL (ref 135–145)

## 2019-08-19 LAB — TSH: TSH: 12.163 u[IU]/mL — ABNORMAL HIGH (ref 0.350–4.500)

## 2019-08-19 LAB — OSMOLALITY, URINE: Osmolality, Ur: 369 mOsm/kg (ref 300–900)

## 2019-08-19 LAB — OSMOLALITY: Osmolality: 259 mOsm/kg — ABNORMAL LOW (ref 275–295)

## 2019-08-19 LAB — CREATININE, URINE, RANDOM: Creatinine, Urine: 64.81 mg/dL

## 2019-08-19 LAB — CORTISOL: Cortisol, Plasma: 10.4 ug/dL

## 2019-08-19 LAB — SODIUM, URINE, RANDOM: Sodium, Ur: 25 mmol/L

## 2019-08-19 MED ORDER — SODIUM CHLORIDE 0.9 % IV SOLN: INTRAVENOUS | Status: DC

## 2019-08-19 MED ORDER — SODIUM CHLORIDE 1 G PO TABS
1.0000 g | ORAL_TABLET | Freq: Once | ORAL | Status: AC
  Administered 2019-08-19: 1 g via ORAL
  Filled 2019-08-19: qty 1

## 2019-08-19 MED ORDER — REVEFENACIN 175 MCG/3ML IN SOLN
175.0000 ug | Freq: Every day | RESPIRATORY_TRACT | Status: DC
  Administered 2019-08-20: 175 ug via RESPIRATORY_TRACT
  Filled 2019-08-19 (×4): qty 3

## 2019-08-19 MED ORDER — LEVALBUTEROL HCL 0.63 MG/3ML IN NEBU
0.6300 mg | INHALATION_SOLUTION | RESPIRATORY_TRACT | Status: DC | PRN
  Administered 2019-08-20: 0.63 mg via RESPIRATORY_TRACT
  Filled 2019-08-19: qty 3

## 2019-08-19 MED ORDER — FUROSEMIDE 20 MG PO TABS
20.0000 mg | ORAL_TABLET | Freq: Two times a day (BID) | ORAL | Status: DC
  Administered 2019-08-19: 20 mg via ORAL
  Filled 2019-08-19 (×2): qty 1

## 2019-08-19 MED ORDER — ARFORMOTEROL TARTRATE 15 MCG/2ML IN NEBU
15.0000 ug | INHALATION_SOLUTION | Freq: Two times a day (BID) | RESPIRATORY_TRACT | Status: DC
  Administered 2019-08-19 – 2019-08-20 (×3): 15 ug via RESPIRATORY_TRACT
  Filled 2019-08-19 (×3): qty 2

## 2019-08-19 MED ORDER — ACETAMINOPHEN 325 MG PO TABS
650.0000 mg | ORAL_TABLET | Freq: Three times a day (TID) | ORAL | Status: DC
  Administered 2019-08-19 – 2019-08-20 (×6): 650 mg via ORAL
  Filled 2019-08-19 (×5): qty 2

## 2019-08-19 MED ORDER — LIDOCAINE HCL URETHRAL/MUCOSAL 2 % EX GEL
CUTANEOUS | Status: DC | PRN
  Administered 2019-08-20 (×2): 5 via TOPICAL
  Filled 2019-08-19: qty 5

## 2019-08-19 MED ORDER — ESCITALOPRAM OXALATE 10 MG PO TABS
5.0000 mg | ORAL_TABLET | Freq: Every day | ORAL | Status: DC
  Administered 2019-08-19: 5 mg via ORAL
  Filled 2019-08-19: qty 1

## 2019-08-19 MED ORDER — SENNA 8.6 MG PO TABS
2.0000 | ORAL_TABLET | Freq: Every day | ORAL | Status: DC
  Administered 2019-08-20: 17.2 mg via ORAL
  Filled 2019-08-19: qty 2

## 2019-08-19 MED ORDER — ESCITALOPRAM OXALATE 10 MG PO TABS: 5.0000 mg | ORAL_TABLET | Freq: Every day | ORAL | Status: DC

## 2019-08-19 MED ORDER — BUDESONIDE 0.5 MG/2ML IN SUSP
0.5000 mg | Freq: Two times a day (BID) | RESPIRATORY_TRACT | Status: DC
  Administered 2019-08-19 – 2019-08-20 (×3): 0.5 mg via RESPIRATORY_TRACT
  Filled 2019-08-19 (×2): qty 2

## 2019-08-19 MED ORDER — REVEFENACIN 175 MCG/3ML IN SOLN
175.0000 ug | Freq: Every day | RESPIRATORY_TRACT | Status: DC
  Filled 2019-08-19: qty 3

## 2019-08-19 NOTE — Progress Notes (Signed)
[place] PHYSICAL MEDICINE & REHABILITATION PROGRESS NOTE  Subjective/Complaints: Patient seen sitting up in bed this morning.  He states he did not sleep well overnight due to multiple reasons.  Wife notes that patient get anxious at night.  He does note improvement in heel pain with Lidoderm patch.  Wife with questions regarding " bulla" on left upper extremity last night, however not present this morning.  He states that he had 2 BMs yesterday.  Discussed chest x-ray with patient and wife.  Discussed Foley removal as well.  ROS: + Shortness of breath, unchanged.  Denies CP, N/V/D  Objective: Vital Signs: Blood pressure 127/71, pulse 88, temperature 98.6 F (37 C), resp. rate 19, height 5\' 9"  (1.753 m), weight 56.3 kg, SpO2 97 %. DG Chest 2 View  Result Date: 08/18/2019 CLINICAL DATA:  Shortness of breath. EXAM: CHEST - 2 VIEW COMPARISON:  PA and lateral chest 06/27/2019. FINDINGS: The patient has new small bilateral pleural effusions. The lungs are markedly emphysematous but clear. Heart size is normal. Aortic atherosclerosis is noted. The patient is status post CABG. No acute or focal bony abnormality. IMPRESSION: Small bilateral pleural effusions.  The lungs are clear. Aortic Atherosclerosis (ICD10-I70.0) and Emphysema (ICD10-J43.9). Electronically Signed   By: Inge Rise M.D.   On: 08/18/2019 11:58   Recent Labs    08/18/19 0438  WBC 8.5  HGB 9.0*  HCT 27.2*  PLT 322   Recent Labs    08/18/19 0438 08/19/19 0648  NA 125* 123*  K 4.0 4.2  CL 85* 83*  CO2 31 33*  GLUCOSE 94 91  BUN 19 17  CREATININE 1.08 0.87  CALCIUM 8.6* 9.0    Physical Exam: BP 127/71   Pulse 88   Temp 98.6 F (37 C)   Resp 19   Ht 5\' 9"  (1.753 m)   Wt 56.3 kg   SpO2 97%   BMI 18.33 kg/m  Constitutional: No distress . Vital signs reviewed. HENT: Normocephalic.  Atraumatic. Eyes: EOMI. No discharge. Cardiovascular: No JVD. Regular rate, premature contractions. Respiratory: Normal  effort.  No stridor.  Decreased air movement.  + [place]. GI: Non-distended.  BS +. Skin: Right AKA with edema and tenderness. Frail skin. Psych: Normal mood.  Normal behavior. Musc: Right AKA with edema and tenderness. Left lower extremity with distal edema Neuro: Alert Extreme HOH Motor: Bilateral upper extremities: 4/5 proximal distally  Left lower extremity: 3+/5 proximal distal Right lower extremity: Hip flexion, knee extension 3+/5 (some pain patient)  Assessment/Plan: 1. Functional deficits secondary to right AKA which require 3+ hours per day of interdisciplinary therapy in a comprehensive inpatient rehab setting.  Physiatrist is providing close team supervision and 24 hour management of active medical problems listed below.  Physiatrist and rehab team continue to assess barriers to discharge/monitor patient progress toward functional and medical goals  Care Tool:  Bathing  Bathing activity did not occur: Refused Body parts bathed by patient: Right arm, Left arm, Chest, Abdomen, Front perineal area, Face, Right upper leg   Body parts bathed by helper: Buttocks, Left upper leg, Left lower leg Body parts n/a: Right lower leg   Bathing assist Assist Level: Moderate Assistance - Patient 50 - 74%     Upper Body Dressing/Undressing Upper body dressing   What is the patient wearing?: Pull over shirt    Upper body assist Assist Level: Contact Guard/Touching assist    Lower Body Dressing/Undressing Lower body dressing      What is the patient  wearing?: Pants     Lower body assist Assist for lower body dressing: Moderate Assistance - Patient 50 - 74%     Toileting Toileting    Toileting assist Assist for toileting: Moderate Assistance - Patient 50 - 74%     Transfers Chair/bed transfer  Transfers assist     Chair/bed transfer assist level: Moderate Assistance - Patient 50 - 74% Chair/bed transfer assistive device: Walker, Armrests    Locomotion Ambulation   Ambulation assist   Ambulation activity did not occur: Safety/medical concerns (R AKA, generalized weakness, decreased balance/postural control, poor endurance, fear)          Walk 10 feet activity   Assist  Walk 10 feet activity did not occur: Safety/medical concerns (R AKA, generalized weakness, decreased balance/postural control, poor endurance, fear)        Walk 50 feet activity   Assist Walk 50 feet with 2 turns activity did not occur: Safety/medical concerns (R AKA, generalized weakness, decreased balance/postural control, poor endurance, fear)         Walk 150 feet activity   Assist Walk 150 feet activity did not occur: Safety/medical concerns (R AKA, generalized weakness, decreased balance/postural control, poor endurance, fear)         Walk 10 feet on uneven surface  activity   Assist Walk 10 feet on uneven surfaces activity did not occur: Safety/medical concerns (R AKA, generalized weakness, decreased balance/postural control, poor endurance, fear)         Wheelchair     Assist Will patient use wheelchair at discharge?: Yes Type of Wheelchair: Manual    Wheelchair assist level: Supervision/Verbal cueing Max wheelchair distance: 57ft    Wheelchair 50 feet with 2 turns activity    Assist        Assist Level: Moderate Assistance - Patient 50 - 74%   Wheelchair 150 feet activity     Assist     Assist Level: Maximal Assistance - Patient 25 - 49%      Medical Problem List and Plan: 1.  Impaired mobility and ADLs secondary to right AKA  Continue CIR 2.  Antithrombotics: -DVT/anticoagulation:  Pharmaceutical: Heparin             -antiplatelet therapy: Aspirin 81mg  daily 3. Pain Management: Has residual limb pain--educate on desensitization techniques.   Norco will be discontinued.  Continue tramadol   Lidoderm patch added for left heel on 8/9  Relatively controlled on 8/10  Monitor with  increased exertion. 4. Mood: Appears depressed--team to provide ego support. LCSW to follow for evaluation and support.    Will hold off on antidepressant given sodium levels   Wife may spend the night to assist with patient compliance and anxiety.             -antipsychotic agents: N/A 5. Neuropsych: This patient may be capable of making decisions on his own behalf. Intermittently disoriented.  6. Skin/Wound Care:  Monitor wound for healing.    Stump shrinker for edema control  7. Fluids/Electrolytes/Nutrition: Monitor I/Os 8.COPD/chronic hypoxic respiratory failure: On chronic steriods, Symbicort BID and Incruse-encourage compliance.  Continues to require supplemental oxygen (baseline)  Chest x-ray personally reviewed, mild effusion  ECG personally reviewed, showing premature complexes 9. BPH/renal calculi:   Foley DC'd, UA, urine culture ordered  Continue finasteride.  10. Anxiety d/o: continue Xanax tid prn. 11. Hyponatremia:   Sodium 123 on 8/10, labs ordered for tomorrow  Will discuss with hospitalist  See #15 12. ABLA:   Hemoglobin 9.0  on 8/9 13. Nasal congestion: Afrin nasal spray X 3 days.  Added saline spray as well as humidification.   14. HTN: Has history of orthostatic hypotension.  Monitor BP   Continue lasix bid, aldactone 12.5/25 mgQOD and Toprol XL.   Controlled on 8/10  Monitor with increased exertion 15. CAD/Chronic systolic CHF: Monitor for symptoms with increase in activity. On aldactone, toprol XL, ASA  and Lipitor Filed Weights   08/17/19 0500 08/18/19 0507 08/19/19 0500  Weight: 55.8 kg 55.7 kg 56.3 kg   Lasix changed to daily on 8/9 secondary to hyponatremia, increased back to twice daily on 8/10  Increased on 8/10 16.  Transaminitis  LFTs elevated on 8/6  Continue to monitor 17.  Severe hypoalbuminemia  Supplement initiated on 8/6  Will consider infusion 18.  Chronic sinusitis according to patient's wife has been seen at [facility] who recommended a 55-month  course of azithromycin.    Resumed at 250 mg/day  LOS: 5 days A FACE TO FACE EVALUATION WAS PERFORMED  Hilbert Briggs Lorie Phenix 08/19/2019, 9:53 AM

## 2019-08-19 NOTE — Progress Notes (Signed)
Orthopedic Tech Progress Note Patient Details:  [name] 12-Sep-1929 [id number]   Called in order to [employer] for a PRAFO BOOT  Patient ID: [name], male   DOB: 28-May-1929, 86 y.o.   MRN: [id number]   [name] 08/19/2019, 8:42 AM

## 2019-08-19 NOTE — Progress Notes (Signed)
Physical Therapy Note  Patient Details  Name: EDVARDO HONSE MRN: [id number] Date of Birth: 01-12-1929 Today's Date: 08/19/2019  After discussion with care team, pt, and pt's wife decided to change pt's plan of care to 15/7 due to poor activity tolerance and inability to tolerance current therapy schedule.   Yetter, DPT   08/19/2019, 10:58 AM

## 2019-08-19 NOTE — Progress Notes (Addendum)
Received a call from [name], with critical lab value: Na: 119: Dr Posey Pronto and Dr.Lin note was reviewed.  The above was discussed with Dr Dagoberto Ligas: She agrees with treatment plan:  1. Sodium Chloride Tablet 1 gram X 1.  2. We will increase his 0.9 NS IV to 152ml/hr 3. Hold Am Dose of Furosemide, until Dr Posey Pronto rounds on patient and review am blood work.   I called Dr Justin Mend: Nephrology regarding the above orders, No new orders received. We will continue to monitor.

## 2019-08-19 NOTE — Plan of Care (Signed)
  Problem: Consults Goal: RH LIMB LOSS PATIENT EDUCATION Description: Description: See Patient Education module for eduction specifics. Outcome: Progressing Goal: Skin Care Protocol Initiated - if Braden Score 18 or less Description: If consults are not indicated, leave blank or document N/A Outcome: Progressing Goal: Nutrition Consult-if indicated Outcome: Progressing   Problem: RH BOWEL ELIMINATION Goal: RH STG MANAGE BOWEL WITH ASSISTANCE Description: STG Manage Bowel with mod Assistance. Outcome: Progressing Goal: RH STG MANAGE BOWEL W/MEDICATION W/ASSISTANCE Description: STG Manage Bowel with Medication with mod Assistance. Outcome: Progressing   Problem: RH BLADDER ELIMINATION Goal: RH STG MANAGE BLADDER WITH ASSISTANCE Description: STG Manage Bladder With  mod I Assistance Outcome: Progressing Goal: RH STG MANAGE BLADDER WITH MEDICATION WITH ASSISTANCE Description: STG Manage Bladder With Medication With mod I Assistance. Outcome: Progressing Goal: RH STG MANAGE BLADDER WITH EQUIPMENT WITH ASSISTANCE Description: STG Manage Bladder With Equipment With mod  I Assistance Outcome: Progressing   Problem: RH SKIN INTEGRITY Goal: RH STG SKIN FREE OF INFECTION/BREAKDOWN Description: Pt will be aware of care of wound and infection prevention measures with mod I assist use of air mattress Outcome: Progressing Goal: RH STG MAINTAIN SKIN INTEGRITY WITH ASSISTANCE Description: STG Maintain Skin Integrity With mod I Assistance. Outcome: Progressing Goal: RH STG ABLE TO PERFORM INCISION/WOUND CARE W/ASSISTANCE Description: STG Able To Perform Incision/Wound Care With Mod I Assistance. Outcome: Progressing   Problem: RH SAFETY Goal: RH STG ADHERE TO SAFETY PRECAUTIONS W/ASSISTANCE/DEVICE Description: STG Adhere to Safety Precautions With mod  I/ cues/reminders Assistance/Devices as recommended by therapy Outcome: Progressing   Problem: RH PAIN MANAGEMENT Goal: RH STG PAIN  MANAGED AT OR BELOW PT'S PAIN GOAL Description: Pain <=4/10 Outcome: Progressing   

## 2019-08-19 NOTE — Progress Notes (Signed)
Attempted to do breathing treatment with patient. Upon entering room patient was on his triology with oxygen bled in. RT asked patient if he would like to take breathing tx. Explained medication to wife who was in room and she agreed to give to patient as well. Patient wanted to go back on nasal cannula and RT explain that the breathing treatment would be given with oxygen and we would place him back on his [place] after treatment ended. Patient then stated he did not want the treatment at this time so he could wake up and wanted the 2 L nasal cannula instead so he could "recover". Asked wife if he did not want this treatment at this scheduled time if he would like it later. She said yes to come back with his 2000 medications. Patient is stable at this time on 2L [place]. Patient wanted to be sat up in bed. Tech was notified.

## 2019-08-19 NOTE — Progress Notes (Signed)
Physical Therapy Session Note  Patient Details  Name: [name] MRN: [id number] Date of Birth: [birth year]  Today's Date: 08/19/2019 PT Individual Time: 0900-0953 PT Individual Time Calculation (min): 53 min   Short Term Goals: Week 1:  PT Short Term Goal 1 (Week 1): pt will perform bed mobility with supervision PT Short Term Goal 2 (Week 1): Pt will transfer sit<>stand with LRAD mod A PT Short Term Goal 3 (Week 1): Pt will transfer bed<>chair with LRAD min A  Skilled Therapeutic Interventions/Progress Updates:   Received pt supine in bed with PA present discussing medications with pt's wife. RN in and out to administer medication and apply dressings and pain patch to pt's L heel. Pt extremely lethargic today per pt's wife and PA and pt stated "I'm just not doing well" and constantly stating "I shouldn't have done this" referring to his amputation. PT provided emotional support and encouragement; pt thankful. Session with emphasis on dressing, functional mobility/transfers, generalized strengthening, dynamic sitting balance/coordination, and improved activity tolerance. O2 sat 96% and HR 90bpm on 2L O2 via [place] at rest. Pt requested PT to take BP. BP 108/55 while supine in bed. Discussion with PA, PT, pt and pt's wife regarding adjusting pt's therapy to 15/7 due to fatigue and poor activity tolerance. PA requested PT to wrap L LE with ace wrap instead of ted hose to prevent edema and to assist with BP. Wrapped L LE total A with pt supine in bed. Pt frequently falling asleep mid-conversation throughout session due to fatigue. Pt rolled L and R with min A and use of bedrails and required max A to don mesh underwear and pants. Pt transferred supine<>sitting EOB with heavy mod A and use of bedrails with cues for logroll technique. Pt required CGA/min A to maintain static sitting balance and frequently falling asleep while sitting EOB. Doffed dirty shirt and donned clean one mod/max A. Pt reported increased  SOB and requested for therapist to increase O2. However O2 sat 96% on 2 L O2. Increased to 2.5 L for pt's comfort and O2 sat 97%. Pt too fatigued to attempt stand<>pivot to WC and instead performed lateral scoot with min A. Donned non-skid sock and L PRAFO boot total A. Concluded session with pt sitting in WC, needs within reach, and replaced chair pad alarm with seatbelt alarm due to pt's poor balance, lethargy, and fatigue. Reduced O2 back to 2L and ot O2 sat 98%. [name] present at bedside.   Therapy Documentation Precautions:  Precautions Precautions: Fall Precaution Comments: R AKA Restrictions Weight Bearing Restrictions: Yes RLE Weight Bearing: Non weight bearing Other Position/Activity Restrictions: On 2L O2   Therapy/Group: Individual Therapy Alfonse Alpers PT, DPT   08/19/2019, 7:28 AM

## 2019-08-19 NOTE — Progress Notes (Signed)
Occupational Therapy Session Note  Patient Details  Name: [name] MRN: [id number] Date of Birth: 01/24/1929  Today's Date: 08/19/2019 OT Individual Time: 2694-8546 and 2703-5009 OT Individual Time Calculation (min): 53 min and 24 min and Today's Date: 08/19/2019 OT Missed Time: 22 Minutes and 36 mins Missed Time Reason: Patient fatigue   Short Term Goals: Week 1:  OT Short Term Goal 1 (Week 1): Patient will complete 1/3 parts of LB dressing task seated EOB. OT Short Term Goal 2 (Week 1): Patient will complete LB bathing seated EOB with Min A. OT Short Term Goal 3 (Week 1): Patient will complete lateral scoot transfer to drop-arm BSC with Min A. OT Short Term Goal 4 (Week 1): Patient will complete toileting/hygiene/clothing management with Min A and lateral leans seated on BSC.  Skilled Therapeutic Interventions/Progress Updates:    1) Treatment session with focus on functional transfers, sit > stand, and dynamic standing balance. Pt received on BSC having BM.  Engaged in sit > stand with RW for hygiene with pt completing sit > stand with mod assist due to increased fearfulness and hesitancy during activity this session.  Encouraged pt to attempt to complete hygiene, however pt stating he didn't feel that he could "right now".  Therapist completed hygiene while pt maintained standing with CGA with RW and wife present providing encouragement and calming voice.  Engaged in sit > stand 2nd attempt to complete clothing management, however pt unable to maintain standing long enough therefore returned to sitting on BSC.  Pt reports not feeling like he had enough energy to complete stand pivot back to bed, therefore repositioned bed to allow for squat pivot to edge of bed.  Pt completed transfer with min assist and max encouragement.  Therapist assisted with donning underwear and pants while pt supine in bed.  Pt completed boosting in bed to allow therapist to pull clothing over hips.  Therapist  redressed and donned shrinker to residual limb and shrinker had fallen off when soiled pants doffed.  Pt continues to demonstrate decreased arousal and endurance during therapy sessions despite vitals and O2 sats WNL.  Pt remained semi-reclined in bed with eyes closed, therefore missed remainder of session.  2) Treatment session with focus on orientation and education on residual limb.  Pt received asleep in bed with wife reporting concerns of "hospital delirium".  Pt awakened and able to identify this therapist by her name, but demonstrating difficulty maintaining arousal to engage in any functional activity.  Discussed care for residual limb as pt reports having phantom pain, especially at night time.  Provided pt and wife with handouts regarding care for residual limb with limb wrapping as well as massage and tapping for desensitization and management of phantom pain.  Pt again falling asleep during session, despite encouragement to engage in conversation.  [name] expressing desire to allow pt to rest.  Discussed sleep/wake cycle with hope for improved restful sleep tonight with her staying over night and then hopeful for improved participation during therapy sessions tomorrow.  Therapy Documentation Precautions:  Precautions Precautions: Fall Precaution Comments: R AKA Restrictions Weight Bearing Restrictions: Yes RLE Weight Bearing: Non weight bearing Other Position/Activity Restrictions: On 2L O2 General: General OT Amount of Missed Time: 22 Minutes and 36 mins Pain:  Pt with no c/o pain   Therapy/Group: Individual Therapy  [name] 08/19/2019, 4:04 PM

## 2019-08-19 NOTE — Progress Notes (Addendum)
  Progress Note    08/19/2019 11:52 AM Right above knee amputation   Subjective:  Was noted to have some bloody serosanguinous drainage over night by PA/ RN, but suspect this is due to a lot of movement while the patient was sleeping. Wife reports that patient has been very lethargic, very figity and having bad dreams. Says that he is having more pain in left heal than in right AKA. Pain is overall well managed  Vitals:   08/19/19 0436 08/19/19 0842  BP: 127/71   Pulse: 88   Resp: 19   Temp: 98.6 F (37 C)   SpO2: 99% 97%   Physical Exam: Cardiac:  regular Lungs:  Non labored Extremities: 2+ femoral pulses bilaterally.  Right above knee amputation site with viable flaps, staples intact. No drainage on compression. Still is ecchymosis on posterior flap but this has not changed. Dressings reapplied and amp stocking  Left foot in Prafo boot Abdomen:  Soft non tender Neurologic: very lethargic but responds appropriately  CBC    Component Value Date/Time   WBC 8.5 08/18/2019 0438   RBC 2.86 (L) 08/18/2019 0438   HGB 9.0 (L) 08/18/2019 0438   HGB 12.4 (L) 06/25/2019 1449   HCT 27.2 (L) 08/18/2019 0438   HCT 36.6 (L) 06/25/2019 1449   PLT 322 08/18/2019 0438   PLT 208 06/25/2019 1449   MCV 95.1 08/18/2019 0438   MCV 96 06/25/2019 1449   MCH 31.5 08/18/2019 0438   MCHC 33.1 08/18/2019 0438   RDW 12.4 08/18/2019 0438   RDW 12.2 06/25/2019 1449   LYMPHSABS 0.5 (L) 08/15/2019 0554   LYMPHSABS 0.4 (L) 06/25/2019 1449   MONOABS 1.0 08/15/2019 0554   EOSABS 0.1 08/15/2019 0554   EOSABS 0.0 06/25/2019 1449   BASOSABS 0.0 08/15/2019 0554   BASOSABS 0.1 06/25/2019 1449    BMET    Component Value Date/Time   NA 123 (L) 08/19/2019 0648   NA 139 06/25/2019 1449   K 4.2 08/19/2019 0648   CL 83 (L) 08/19/2019 0648   CO2 33 (H) 08/19/2019 0648   GLUCOSE 91 08/19/2019 0648   BUN 17 08/19/2019 0648   BUN 29 (H) 06/25/2019 1449   CREATININE 0.87 08/19/2019 0648   CALCIUM 9.0  08/19/2019 0648   GFRNONAA >60 08/19/2019 0648   GFRAA >60 08/19/2019 0648    INR    Component Value Date/Time   INR 1.0 08/11/2019 0715     Intake/Output Summary (Last 24 hours) at 08/19/2019 1152 Last data filed at 08/19/2019 0900 Gross per 24 hour  Intake 498 ml  Output 1250 ml  Net -752 ml     Assessment/Plan:  86 y.o. male is s/p right AKA 8 Days Post Op. The stump is healing well with ecchymosis still present on posterior flap. No drainage on my examination from amp site. Will continue to monitor. Daily dressing changes and can continue stump stocking  Karoline Caldwell, PA-C [facility] [phone] 08/19/2019 11:52 AM    I agree with the above assessment and plan as documented by C. Baglia   Annamarie Major

## 2019-08-19 NOTE — Consult Note (Signed)
NAME:  [name], MRN:  [id number], DOB:  02/14/1929, LOS: 5 ADMISSION DATE:  08/14/2019, CONSULTATION DATE: 08/19/19 REFERRING MD:  Dr. Posey Pronto, CHIEF COMPLAINT:  Short of breath   Brief History   86 yo male former smoker with COPD and chronic hypoxic/hypercapnic respiratory failure on 2 liters oxygen and Trilogy home vent had non-healing Rt foot wound and had Rt AKA on 08/11/19 by vascular surgery.  He was admitted to [facility] for rehab on 08/14/19.  He had difficulty using Trilogy/Bipap in CIR and reported more sense of dyspnea.  PCCM asked to assess.  History of present illness   He is followed by Dr. [name] in pulmonary office and had 2nd opinion at [facility].  He is on chronic prednisone and zithromax.  He uses 2 liters oxygen at baseline.  He has been on Trilogy home vent at night for hypercapnia.    He had Rt AKA.  He was transferred to CIR.  He developed lethargy and confusion.  He was found to have hyponatremia.  He has been seen by nephrology.  He has not had much cough or sputum.  He has chronic sinus congestion.  Not having wheeze.  No documented fever.  White count normal.  He is on baseline 2 liters.  Has been using Trilogy at night and not having any trouble with pressure setting.  Past Medical History  Influenza A January 2019, CAD s/p CABG, Diastolic CHF, PAD, HLD, Vit D deficiency, BPH, HTN  Significant Hospital Events   8/02 Rt AKA 8/05 Transfer to CIR  Consults:  Vascular surgery Nephrology  Procedures:    Significant Diagnostic Tests:   PFT 03/26/08 >> FEV1 0.94 (34%), FEV1% 30, TLC 6.98 (109%), DLCO 38%, +BD  PFT 06/08/15 >> FEV1 0.58 (22%), FEV1% 27, TLC 6.87 (99%), DLCO 9%, +BD  A1AT 03/01/17 >> 124, MM  ABG on 2 liters 04/04/17 >> pH 7.41, PCO2 49.1, PO2 93.3  CT angio chest 03/08/17 >> apical scarring, calcified granulomas, mod/severe centrilobular emphysema, basilar scarring  Micro Data:  Urine 8/10 >>   Antimicrobials:  Chronic azithromycin  Interim  history/subjective:    Objective   Blood pressure 127/71, pulse 88, temperature 98.6 F (37 C), resp. rate 19, height 5\' 9"  (1.753 m), weight 56.3 kg, SpO2 97 %.        Intake/Output Summary (Last 24 hours) at 08/19/2019 1518 Last data filed at 08/19/2019 0900 Gross per 24 hour  Intake 358 ml  Output 1250 ml  Net -892 ml   Filed Weights   08/17/19 0500 08/18/19 0507 08/19/19 0500  Weight: 55.8 kg 55.7 kg 56.3 kg    Examination:  General - sleepy Eyes - pupils reactive ENT - no sinus tenderness, no stridor Cardiac - regular rate/rhythm, no murmur Chest - decreased BS, prolonged exhalation, no wheeze Abdomen - soft, non tender, + bowel sounds Extremities - Rt AKA Skin - no rashes Neuro - follows simple commands, mildly confused  CXR (reviewed by me) - hyperinflation, no infiltrates  Discussion:  He has severe COPD with emphysema and chronic hypoxic/hypercapnic respiratory failure.  I am not getting a sense that he is having an exacerbation of COPD.  I am not sure changing his Trilogy to hospital Bipap would provide extra benefit.  It appears the main issue at present is related to hyponatremia and mental status change from this.  As such, I am concerned he is not adequately using spiriva/symbicort while he is more confused.  Assessment & Plan:  Dyspnea with hx of COPD and chronic hypoxic/hypercapnic respiratory failure. - continue oxygen to keep SpO2 90 to 95% - continue Trilogy home vent at night with current settings - will try him on nebulizer therapy instead of spiriva/symbicort until mental status improves - continue prednisone 10 mg daily, continue zithromax 250 mg daily  Hypovolemia, hyponatremia. - nephrology consulted  PAD s/p Rt AKA. - post op care per VVS and PMR  Best practice:  Diet: heart healthy DVT prophylaxis: Lovenox GI prophylaxis: Pepcid Mobility: Per PMR Code Status: DNI Family Communication: Updated pt's wife at bedside  Labs    CBC: Recent Labs  Lab 08/13/19 0317 08/14/19 0047 08/15/19 0554 08/18/19 0438  WBC 10.9* 11.2* 8.6 8.5  NEUTROABS  --  9.2* 6.9  --   HGB 9.5* 9.1* 8.8* 9.0*  HCT 28.2* 28.3* 27.2* 27.2*  MCV 95.9 96.6 96.5 95.1  PLT 261 257 279 286    Basic Metabolic Panel: Recent Labs  Lab 08/13/19 0317 08/14/19 0047 08/15/19 0554 08/18/19 0438 08/19/19 0648  NA 130* 130* 129* 125* 123*  K 5.0 4.2 4.1 4.0 4.2  CL 94* 93* 90* 85* 83*  CO2 26 30 30 31  33*  GLUCOSE 84 92 93 94 91  BUN 17 18 18 19 17   CREATININE 0.90 0.89 0.99 1.08 0.87  CALCIUM 8.3* 8.5* 8.6* 8.6* 9.0  MG 1.8  --   --   --   --    GFR: Estimated Creatinine Clearance: 46.7 mL/min (by C-G formula based on SCr of 0.87 mg/dL). Recent Labs  Lab 08/13/19 0317 08/14/19 0047 08/15/19 0554 08/18/19 0438  WBC 10.9* 11.2* 8.6 8.5    Liver Function Tests: Recent Labs  Lab 08/15/19 0554  AST 48*  ALT 48*  ALKPHOS 85  BILITOT 0.7  PROT 4.9*  ALBUMIN 2.4*   No results for input(s): LIPASE, AMYLASE in the last 168 hours. No results for input(s): AMMONIA in the last 168 hours.  ABG    Component Value Date/Time   PHART 7.415 04/04/2017 2210   PCO2ART 49.1 (H) 04/04/2017 2210   PO2ART 93.3 04/04/2017 2210   HCO3 30.9 (H) 04/04/2017 2210   TCO2 32 04/04/2017 0056   O2SAT 97.6 04/04/2017 2210     Coagulation Profile: No results for input(s): INR, PROTIME in the last 168 hours.  Cardiac Enzymes: No results for input(s): CKTOTAL, CKMB, CKMBINDEX, TROPONINI in the last 168 hours.  HbA1C: Hgb A1c MFr Bld  Date/Time Value Ref Range Status  02/23/2010 07:35 AM (H) <5.7 % Final   5.7 (NOTE)                                                                       According to the ADA Clinical Practice Recommendations for 2011, when HbA1c is used as a screening test:   >=6.5%   Diagnostic of Diabetes Mellitus           (if abnormal result  is confirmed)  5.7-6.4%   Increased risk of developing Diabetes Mellitus   References:Diagnosis and Classification of Diabetes Mellitus,Diabetes NOTR,7116,57(XUXYB 1):S62-S69 and Standards of Medical Care in         Diabetes - 2011,Diabetes [id number],2919,16  (Suppl 1):S11-S61.    CBG: No results for input(s):  GLUCAP in the last 168 hours.  Review of Systems:   Reviewed and negative  Past Medical History  He,  has a past medical history of Anxiety, Arthritis, Asthma, Basal cell carcinoma, BPH (benign prostatic hyperplasia), CAD (coronary artery disease), Carotid artery occlusion, Chronic respiratory failure ([place]), Chronic rhinitis, COPD (chronic obstructive pulmonary disease) ([place]), Diastolic heart failure ([place]) (2019), Dilation of biliary tract, Elevated troponin (03/09/2017), Gallstones, GERD (gastroesophageal reflux disease), History of blood transfusion, History of kidney stones, Hyperlipidemia, Hypertension, On home oxygen therapy, Peripheral vascular disease ([place]), Pneumonia (2019), Squamous cell carcinoma of skin (04/23/2013), Squamous cell carcinoma of skin (01/23/2017), Syncope and collapse, Ureteral tumor (08/2015), and Urinary catheter (Foley) change required.   Surgical History    Past Surgical History:  Procedure Laterality Date  . ABDOMINAL AORTOGRAM W/LOWER EXTREMITY Bilateral 06/30/2019   Procedure: ABDOMINAL AORTOGRAM W/LOWER EXTREMITY;  Surgeon: Waynetta Sandy, MD;  Location: [facility];  Service: Cardiovascular;  Laterality: Bilateral;  . AMPUTATION Right 08/11/2019   Procedure: RIGHT ABOVE KNEE AMPUTATION;  Surgeon: Angelia Mould, MD;  Location: [facility];  Service: Vascular;  Laterality: Right;  . BASAL CELL CARCINOMA EXCISION Left    ear  . CARDIAC CATHETERIZATION     "prior to bypass"  . CATARACT EXTRACTION W/ INTRAOCULAR LENS  IMPLANT, BILATERAL Bilateral   . CORONARY ARTERY BYPASS GRAFT  10/2001   LIMA to LAD, SVG to OM1-2, SVG to RCA and PDA  . LITHOTRIPSY    . PROSTATE BIOPSY  Oct. 2014  . TONSILLECTOMY       Social  History   reports that he quit smoking about 37 years ago. His smoking use included cigarettes. He has a 66.00 pack-year smoking history. He has never used smokeless tobacco. He reports current alcohol use. He reports that he does not use drugs.   Family History   His family history includes Allergies in his brother; Breast cancer in his mother; Cancer in his mother; Heart attack in his father; Heart disease in his brother, brother, father, and mother; Hyperlipidemia in his brother; Hypertension in his brother and mother; Other in his mother; Peripheral vascular disease in his brother; Stroke in his brother.   Allergies Allergies  Allergen Reactions  . Cefdinir Diarrhea and Other (See Comments)    Severe Diarrhea  . Nitrofurantoin Swelling and Other (See Comments)    Hand Swelling  . Sulfa Antibiotics Anaphylaxis and Swelling  . Sulfonamide Derivatives Swelling and Other (See Comments)    Facial/tongue swelling  . Tape Other (See Comments)    SKIN IS VERY THIN AND TEARS EASILY!!!!! Please do NOT use "plastic" tape- USE PAPER!!  . Ciprofloxacin Itching, Rash and Other (See Comments)    Red itchy hands  . Nitrofurantoin Swelling and Other (See Comments)    Swollen hands  . Amoxicillin Er Other (See Comments)    Frequest urination  . Doxycycline Other (See Comments)    "Felt terrible"  . Levaquin [Levofloxacin] Itching and Rash  . Sertraline Anxiety and Other (See Comments)    Makes the patient jittery     Home Medications  Prior to Admission medications   Medication Sig Start Date End Date Taking? Authorizing Provider  acetaminophen (TYLENOL) 500 MG tablet Take 1,000 mg by mouth every 6 (six) hours as needed for moderate pain.     [provider]  ALPRAZolam (XANAX) 0.25 MG tablet TAKE 1 TABLET BY MOUTH THREE TIMES DAILY AS NEEDED Patient taking differently: Take 0.125-0.25 mg by mouth 3 (  three) times daily as needed for anxiety.  04/21/19   Tanda Rockers, MD  aspirin 81  MG tablet Take 81 mg by mouth daily.    [provider]  atorvastatin (LIPITOR) 20 MG tablet TAKE 1 TABLET BY MOUTH EVERY DAY Patient taking differently: Take 20 mg by mouth at bedtime.  05/24/16   Belva Crome, MD  Cholecalciferol (VITAMIN D3) 2000 units TABS Take 2,000 Units by mouth daily with lunch.     [provider]  famotidine (PEPCID) 20 MG tablet Take 20 mg by mouth at bedtime.     [provider]  finasteride (PROSCAR) 5 MG tablet Take 5 mg by mouth daily with lunch.     [provider]  fluticasone (FLONASE) 50 MCG/ACT nasal spray Place 2 sprays into both nostrils daily.    [provider]  furosemide (LASIX) 20 MG tablet Take 1 tablet (20 mg total) by mouth 2 (two) times daily. 06/25/19   Burtis Junes, NP  levalbuterol [facility] HFA) 45 MCG/ACT inhaler Inhale 2 puffs into the lungs every 4 (four) hours as needed for wheezing or shortness of breath. 04/14/19   Tanda Rockers, MD  levalbuterol (XOPENEX) 0.63 MG/3ML nebulizer solution USE 1 VIAL VIA NEBULIZER EVERY 4 HOURS AS NEEDED FOR WHEEZING OR SHORTNESS OF BREATH Patient taking differently: Take 0.63 mg by nebulization every 4 (four) hours as needed for wheezing or shortness of breath.  05/10/18   Tanda Rockers, MD  metoprolol succinate (TOPROL-XL) 25 MG 24 hr tablet Take 0.5 tablets (12.5 mg total) by mouth daily. 07/11/19   Belva Crome, MD  mometasone (NASONEX) 50 MCG/ACT nasal spray Place 2 sprays into the nose daily. 04/22/19   Tanda Rockers, MD  morphine (MSIR) 15 MG tablet Take 15 mg by mouth every 4 (four) hours as needed for severe pain.    [provider]  Multiple Vitamins-Minerals (PRESERVISION AREDS 2) CAPS Take 1 capsule by mouth daily.    [provider]  NITROSTAT 0.4 MG SL tablet Place 0.4 mg under the tongue every 5 (five) minutes as needed for chest pain.    [provider]  OXYGEN Inhale 2 L/min into the lungs continuous.     [provider]  predniSONE (DELTASONE) 10 MG tablet FOR FLARE OF COUGH/WHEEZING MAY INCREASE TO 2 DAILY BETTER, THEN 1 DAILY Patient taking differently: Take 10-20 mg by mouth in the morning. Take 10 mg by mouth in the morning and may increase to 20 mg as needed for coughing/wheezing (then decrease back to 10 mg once daily) 02/25/19   Tanda Rockers, MD  PRESCRIPTION MEDICATION See admin instructions. [employer] Trilogy ventilator- At bedtime    [provider]  silodosin (RAPAFLO) 4 MG CAPS capsule Take 4 mg by mouth daily. 06/17/19   [provider]  sodium chloride (MURO 128) 5 % ophthalmic solution Place 1 drop into both eyes 4 (four) times daily.    [provider]  spironolactone (ALDACTONE) 25 MG tablet Take 12.5-25 mg by mouth See admin instructions. 12.5 mg on day and 25 mg the next day, pt alternates dosages 07/15/19   [provider]  SYMBICORT 160-4.5 MCG/ACT inhaler Inhale 2 puffs into the lungs 2 (two) times daily. 06/06/19   Tanda Rockers, MD  Tiotropium Bromide Monohydrate (SPIRIVA RESPIMAT) 2.5 MCG/ACT AERS INHALE 2 PUFFS BY MOUTH EVERY DAY Patient taking differently: Inhale 2 puffs into the lungs daily.  05/14/19   Wert,  Christena Deem, MD  vitamin B-12 (CYANOCOBALAMIN) 1000 MCG tablet Take 1,000 mcg by mouth daily.     [provider]     Signature:  Chesley Mires, MD [facility] Pager - [phone] 08/19/2019, 3:30 PM

## 2019-08-19 NOTE — Progress Notes (Signed)
Reviewed medications with wife. Patient reporting abdominal pain--did have two BMs yesterday and want's a break from laxative due to diarrhea-->changed miirlax/colace to Senna 2 at bedtime starting tomorrow. He is also reporting increase in pain/phantom pain as well depressed mood and poor energy--we talked about past medical issues over past 2 months and need to build up energy. He about trial of Zoloft--note it's on allergy list but was tried years ago. Discussed Lexapro trial --will start today and monitor for SE. Discussed energy levels with  PT--> modify to 15/7 days.    Tylenol scheduled qid and patient/wife educated on desensitization of residual limb to help manage phantom pain/neuropathy.  Wife also requesting increase in prednisone to 20 mg/daily for a few days to help respiratory status.  Await for effect of lexapro and tylenol to see if it helps overall activity. Dr. Posey Pronto discussed wife staying at nights for comfort/support which will help his sleep as well as compliance with meds/Trilegy. He is concerned that Trilegy not working-->RT contacted to check machine and provide one from hospital if needed.    Residual limb has min amount of serosanguinous drainage from inferior aspect with ecchymotic area on flap--incision intact without erythema but boggy due to edema on superior aspect. Marland Kitchen

## 2019-08-19 NOTE — Progress Notes (Signed)
CRITICAL VALUE ALERT  Critical Value:  Sodium 119  Date & Time Notied:  08/19/19 @2008   Provider Notified: Danella Sensing, NP @2010   Orders Received/Actions taken:  -Increase continuous NS IV fluids to 117ml/hr -Administer one time dose of 1g salt tablet -Hold 0800 dose of Lasix on 08/20/19 until evaluated by Dr. Posey Pronto

## 2019-08-19 NOTE — Consult Note (Signed)
Reason for Consult:Hyponatremia Referring Physician:  Reesa Chew, PA  Chief Complaint: Rt AKA  Assessment/Plan: 1. Hyponatremia - appears to be hypovolemic on PE and also with increasing HCO3 possibly representing contraction alkalosis. Nausea has been very mild, has sinus drainage, and pain as well which can all lead to ADH release but I am concerned that he may be volume down which can also cause hyponatremia. - At this time I would like to 1st give a trial with isotonic hydration + continue to hold diuretics. Will need to watch closely as if this is SIADH then Na can drop more with isotonic fluids. - Urine studies, osmolality, TSH, cortisol. - Possible offending agents have already been discontinued. 2. PAD s/p rt AKA 3. COPD on O2 and breathing treatments. 4. CASHD 5. CHF (diastolic) 6. HTN 7. CKD3 (BL1-1.1)   HPI: [name] is an 86 y.o. male COPD oxygen dependent, BPH with foley for retention, PAF, HTN, CASHD s/p CAVG, PAD with chronic revascularizations leading to a Rt AKA by Dr. Scot Dock. We were consulted for hyponatremia. Na was 136 on 8/2 and since then has been steadily decreasing with normal renal function. Lexapro was last given on day of consultation but was discontinued.  Pt was also treated with Lasix 20mg  twice daily  Since 8/5. He has had very mild nausea only in the am but has phantom pain present. He denies being severely thirsty other than having a dry mouth which he thinks is from inhaler use.   ROS Pertinent items are noted in HPI.  Chemistry and CBC: Creatinine, Ser  Date/Time Value Ref Range Status  08/19/2019 06:48 AM 0.87 0.61 - 1.24 mg/dL Final  08/18/2019 04:38 AM 1.08 0.61 - 1.24 mg/dL Final  08/15/2019 05:54 AM 0.99 0.61 - 1.24 mg/dL Final  08/14/2019 12:47 AM 0.89 0.61 - 1.24 mg/dL Final  08/13/2019 03:17 AM 0.90 0.61 - 1.24 mg/dL Final  08/11/2019 07:15 AM 1.20 0.61 - 1.24 mg/dL Final  07/02/2019 04:38 AM 0.93 0.61 - 1.24 mg/dL Final  07/01/2019  06:04 AM 1.07 0.61 - 1.24 mg/dL Final  06/30/2019 01:14 PM 0.96 0.61 - 1.24 mg/dL Final  06/28/2019 02:31 AM 1.14 0.61 - 1.24 mg/dL Final  06/27/2019 02:22 PM 1.44 (H) 0.61 - 1.24 mg/dL Final  06/25/2019 02:49 PM 1.28 (H) 0.76 - 1.27 mg/dL Final  03/27/2019 04:39 PM 1.09 0.76 - 1.27 mg/dL Final  03/05/2019 04:08 PM 1.16 0.76 - 1.27 mg/dL Final  12/20/2018 04:07 PM 1.13 0.76 - 1.27 mg/dL Final  07/10/2018 04:04 PM 1.11 0.76 - 1.27 mg/dL Final  03/01/2018 03:26 PM 1.15 0.40 - 1.50 mg/dL Final  02/22/2018 03:07 PM 1.06 0.40 - 1.50 mg/dL Final  01/04/2018 03:47 PM 1.17 0.76 - 1.27 mg/dL Final  12/03/2017 03:11 PM 1.15 0.61 - 1.24 mg/dL Final  11/09/2017 04:18 PM 1.14 0.40 - 1.50 mg/dL Final  08/28/2017 04:58 PM 1.10 0.40 - 1.50 mg/dL Final  07/09/2017 01:25 PM 1.01 0.76 - 1.27 mg/dL Final  06/01/2017 12:32 PM 0.99 0.76 - 1.27 mg/dL Final  04/05/2017 06:12 AM 1.01 0.61 - 1.24 mg/dL Final  04/04/2017 11:14 AM 1.23 0.61 - 1.24 mg/dL Final  04/04/2017 12:56 AM 1.20 0.61 - 1.24 mg/dL Final  04/04/2017 12:56 AM 1.21 0.61 - 1.24 mg/dL Final  03/26/2017 06:21 AM 0.97 0.61 - 1.24 mg/dL Final  03/25/2017 09:21 AM 0.91 0.61 - 1.24 mg/dL Final  03/24/2017 08:29 PM 0.99 0.61 - 1.24 mg/dL Final  03/21/2017 03:32 PM 1.04 0.76 - 1.27 mg/dL Final  03/12/2017 04:21 AM 1.18 0.61 - 1.24 mg/dL Final  03/11/2017 08:54 AM 1.13 0.61 - 1.24 mg/dL Final  03/10/2017 09:51 AM 1.13 0.61 - 1.24 mg/dL Final  03/09/2017 01:39 AM 1.15 0.61 - 1.24 mg/dL Final  03/08/2017 03:45 PM 1.13 0.61 - 1.24 mg/dL Final  03/01/2017 12:53 PM 1.16 0.40 - 1.50 mg/dL Final  02/05/2017 07:32 AM 0.89 0.61 - 1.24 mg/dL Final  02/04/2017 02:29 PM 1.11 0.61 - 1.24 mg/dL Final  10/03/2016 02:17 PM 0.89 0.76 - 1.27 mg/dL Final  07/06/2016 01:28 PM 1.03 0.40 - 1.50 mg/dL Final  03/31/2016 11:48 AM 1.02 0.40 - 1.50 mg/dL Final  10/27/2015 02:15 PM 0.95 0.40 - 1.50 mg/dL Final  10/06/2015 12:20 PM 0.99 0.40 - 1.50 mg/dL Final  09/25/2015  04:33 AM 0.95 0.61 - 1.24 mg/dL Final  09/24/2015 10:52 PM 1.05 0.61 - 1.24 mg/dL Final  09/24/2015 03:41 PM 1.07 0.61 - 1.24 mg/dL Final  09/12/2015 09:22 PM 0.95 0.61 - 1.24 mg/dL Final  04/15/2015 11:18 AM 1.11 0.40 - 1.50 mg/dL Final  11/21/2012 09:12 AM 0.94 0.50 - 1.35 mg/dL Final  04/05/2012 11:27 AM 0.9 0.40 - 1.50 mg/dL Final   Recent Labs  Lab 08/13/19 0317 08/14/19 0047 08/15/19 0554 08/18/19 0438 08/19/19 0648  NA 130* 130* 129* 125* 123*  K 5.0 4.2 4.1 4.0 4.2  CL 94* 93* 90* 85* 83*  CO2 26 30 30 31  33*  GLUCOSE 84 92 93 94 91  BUN 17 18 18 19 17   CREATININE 0.90 0.89 0.99 1.08 0.87  CALCIUM 8.3* 8.5* 8.6* 8.6* 9.0   Recent Labs  Lab 08/13/19 0317 08/14/19 0047 08/15/19 0554 08/18/19 0438  WBC 10.9* 11.2* 8.6 8.5  NEUTROABS  --  9.2* 6.9  --   HGB 9.5* 9.1* 8.8* 9.0*  HCT 28.2* 28.3* 27.2* 27.2*  MCV 95.9 96.6 96.5 95.1  PLT 261 257 279 322   Liver Function Tests: Recent Labs  Lab 08/15/19 0554  AST 48*  ALT 48*  ALKPHOS 85  BILITOT 0.7  PROT 4.9*  ALBUMIN 2.4*   No results for input(s): LIPASE, AMYLASE in the last 168 hours. No results for input(s): AMMONIA in the last 168 hours. Cardiac Enzymes: No results for input(s): CKTOTAL, CKMB, CKMBINDEX, TROPONINI in the last 168 hours. Iron Studies: No results for input(s): IRON, TIBC, TRANSFERRIN, FERRITIN in the last 72 hours. PT/INR: @LABRCNTIP (inr:5)  Xrays/Other Studies: ) Results for orders placed or performed during the hospital encounter of 08/14/19 (from the past 48 hour(s))  Basic metabolic panel     Status: Abnormal   Collection Time: 08/18/19  4:38 AM  Result Value Ref Range   Sodium 125 (L) 135 - 145 mmol/L   Potassium 4.0 3.5 - 5.1 mmol/L   Chloride 85 (L) 98 - 111 mmol/L   CO2 31 22 - 32 mmol/L   Glucose, Bld 94 70 - 99 mg/dL    Comment: Glucose reference range applies only to samples taken after fasting for at least 8 hours.   BUN 19 8 - 23 mg/dL   Creatinine, Ser 1.08 0.61  - 1.24 mg/dL   Calcium 8.6 (L) 8.9 - 10.3 mg/dL   GFR calc non Af Amer >60 >60 mL/min   GFR calc Af Amer >60 >60 mL/min   Anion gap 9 5 - 15    Comment: Performed at [facility] [address] [place], [place] [address]  CBC     Status: Abnormal   Collection Time: 08/18/19  4:38 AM  Result Value Ref Range   WBC 8.5 4.0 - 10.5 K/uL   RBC 2.86 (L) 4.22 - 5.81 MIL/uL   Hemoglobin 9.0 (L) 13.0 - 17.0 g/dL   HCT 27.2 (L) 39 - 52 %   MCV 95.1 80.0 - 100.0 fL   MCH 31.5 26.0 - 34.0 pg   MCHC 33.1 30.0 - 36.0 g/dL   RDW 12.4 11.5 - 15.5 %   Platelets 322 150 - 400 K/uL   nRBC 0.0 0.0 - 0.2 %    Comment: Performed at [facility], [place] [address] [place], [place] [address]  Basic metabolic panel     Status: Abnormal   Collection Time: 08/19/19  6:48 AM  Result Value Ref Range   Sodium 123 (L) 135 - 145 mmol/L   Potassium 4.2 3.5 - 5.1 mmol/L   Chloride 83 (L) 98 - 111 mmol/L   CO2 33 (H) 22 - 32 mmol/L   Glucose, Bld 91 70 - 99 mg/dL    Comment: Glucose reference range applies only to samples taken after fasting for at least 8 hours.   BUN 17 8 - 23 mg/dL   Creatinine, Ser 0.87 0.61 - 1.24 mg/dL   Calcium 9.0 8.9 - 10.3 mg/dL   GFR calc non Af Amer >60 >60 mL/min   GFR calc Af Amer >60 >60 mL/min   Anion gap 7 5 - 15    Comment: Performed at [facility] [address] [place], [place] [address]   *Note: Due to a large number of results and/or encounters for the requested time period, some results have not been displayed. A complete set of results can be found in Results Review.   DG Chest 2 View  Result Date: 08/18/2019 CLINICAL DATA:  Shortness of breath. EXAM: CHEST - 2 VIEW COMPARISON:  PA and lateral chest 06/27/2019. FINDINGS: The patient has new small bilateral pleural effusions. The lungs are markedly emphysematous but clear. Heart size is normal. Aortic atherosclerosis is noted. The patient is status post CABG. No acute or focal bony abnormality.  IMPRESSION: Small bilateral pleural effusions.  The lungs are clear. Aortic Atherosclerosis (ICD10-I70.0) and Emphysema (ICD10-J43.9). Electronically Signed   By: Inge Rise M.D.   On: 08/18/2019 11:58    PMH:   Past Medical History:  Diagnosis Date  . Anxiety   . Arthritis    "generalized" (04/04/2017)  . Asthma   . Basal cell carcinoma    "left ear"  . BPH (benign prostatic hyperplasia)    severe; s/p multiple biopsies  . CAD (coronary artery disease)   . Carotid artery occlusion   . Chronic respiratory failure ([place])   . Chronic rhinitis   . COPD (chronic obstructive pulmonary disease) (H[facility])    2L [place] O2  . Diastolic heart failure ([place]) 2019  . Dilation of biliary tract   . Elevated troponin 03/09/2017  . Gallstones   . GERD (gastroesophageal reflux disease)   . History of blood transfusion    "w/his CABG" (04/04/2017)  . History of kidney stones   . Hyperlipidemia   . Hypertension   . On home oxygen therapy    "~ 24/7" (04/04/2017)  . Peripheral vascular disease ([facility])   . Pneumonia 2019  . Squamous cell carcinoma of skin 04/23/2013   in situ-Right flank (txpbx)  . Squamous cell carcinoma of skin 01/23/2017   [place]/ in siut- Left ear rim (CX3FU)  . Syncope and collapse   .  Ureteral tumor 08/2015   had endoscopic procedure for evaluation, unable to reach for biopsy  . Urinary catheter (Foley) change required     PSH:   Past Surgical History:  Procedure Laterality Date  . ABDOMINAL AORTOGRAM W/LOWER EXTREMITY Bilateral 06/30/2019   Procedure: ABDOMINAL AORTOGRAM W/LOWER EXTREMITY;  Surgeon: Waynetta Sandy, MD;  Location: [facility];  Service: Cardiovascular;  Laterality: Bilateral;  . AMPUTATION Right 08/11/2019   Procedure: RIGHT ABOVE KNEE AMPUTATION;  Surgeon: Angelia Mould, MD;  Location: [facility];  Service: Vascular;  Laterality: Right;  . BASAL CELL CARCINOMA EXCISION Left    ear  . CARDIAC CATHETERIZATION     "prior to bypass"  .  CATARACT EXTRACTION W/ INTRAOCULAR LENS  IMPLANT, BILATERAL Bilateral   . CORONARY ARTERY BYPASS GRAFT  10/2001   LIMA to LAD, SVG to OM1-2, SVG to RCA and PDA  . LITHOTRIPSY    . PROSTATE BIOPSY  Oct. 2014  . TONSILLECTOMY      Allergies:  Allergies  Allergen Reactions  . Cefdinir Diarrhea and Other (See Comments)    Severe Diarrhea  . Nitrofurantoin Swelling and Other (See Comments)    Hand Swelling  . Sulfa Antibiotics Anaphylaxis and Swelling  . Sulfonamide Derivatives Swelling and Other (See Comments)    Facial/tongue swelling  . Tape Other (See Comments)    SKIN IS VERY THIN AND TEARS EASILY!!!!! Please do NOT use "plastic" tape- USE PAPER!!  . Ciprofloxacin Itching, Rash and Other (See Comments)    Red itchy hands  . Nitrofurantoin Swelling and Other (See Comments)    Swollen hands  . Amoxicillin Er Other (See Comments)    Frequest urination  . Doxycycline Other (See Comments)    "Felt terrible"  . Levaquin [Levofloxacin] Itching and Rash  . Sertraline Anxiety and Other (See Comments)    Makes the patient jittery    Medications:   Prior to Admission medications   Medication Sig Start Date End Date Taking? Authorizing Provider  acetaminophen (TYLENOL) 500 MG tablet Take 1,000 mg by mouth every 6 (six) hours as needed for moderate pain.     [provider]  ALPRAZolam (XANAX) 0.25 MG tablet TAKE 1 TABLET BY MOUTH THREE TIMES DAILY AS NEEDED Patient taking differently: Take 0.125-0.25 mg by mouth 3 (three) times daily as needed for anxiety.  04/21/19   Tanda Rockers, MD  aspirin 81 MG tablet Take 81 mg by mouth daily.    [provider]  atorvastatin (LIPITOR) 20 MG tablet TAKE 1 TABLET BY MOUTH EVERY DAY Patient taking differently: Take 20 mg by mouth at bedtime.  05/24/16   Belva Crome, MD  Cholecalciferol (VITAMIN D3) 2000 units TABS Take 2,000 Units by mouth daily with lunch.     [provider]  famotidine (PEPCID) 20 MG tablet Take 20  mg by mouth at bedtime.     [provider]  finasteride (PROSCAR) 5 MG tablet Take 5 mg by mouth daily with lunch.     [provider]  fluticasone (FLONASE) 50 MCG/ACT nasal spray Place 2 sprays into both nostrils daily.    [provider]  furosemide (LASIX) 20 MG tablet Take 1 tablet (20 mg total) by mouth 2 (two) times daily. 06/25/19   Burtis Junes, NP  levalbuterol [facility] HFA) 45 MCG/ACT inhaler Inhale 2 puffs into the lungs every 4 (four) hours as needed for wheezing or shortness of breath. 04/14/19   Tanda Rockers, MD  levalbuterol (XOPENEX) 0.63 MG/3ML nebulizer solution USE 1 VIAL VIA NEBULIZER EVERY 4 HOURS AS NEEDED FOR WHEEZING OR SHORTNESS OF BREATH Patient taking differently: Take 0.63 mg by nebulization every 4 (four) hours as needed for wheezing or shortness of breath.  05/10/18   Tanda Rockers, MD  metoprolol succinate (TOPROL-XL) 25 MG 24 hr tablet Take 0.5 tablets (12.5 mg total) by mouth daily. 07/11/19   Belva Crome, MD  mometasone (NASONEX) 50 MCG/ACT nasal spray Place 2 sprays into the nose daily. 04/22/19   Tanda Rockers, MD  morphine (MSIR) 15 MG tablet Take 15 mg by mouth every 4 (four) hours as needed for severe pain.    [provider]  Multiple Vitamins-Minerals (PRESERVISION AREDS 2) CAPS Take 1 capsule by mouth daily.    [provider]  NITROSTAT 0.4 MG SL tablet Place 0.4 mg under the tongue every 5 (five) minutes as needed for chest pain.    [provider]  OXYGEN Inhale 2 L/min into the lungs continuous.     [provider]  predniSONE (DELTASONE) 10 MG tablet FOR FLARE OF COUGH/WHEEZING MAY INCREASE TO 2 DAILY BETTER, THEN 1 DAILY Patient taking differently: Take 10-20 mg by mouth in the morning. Take 10 mg by mouth in the morning and may increase to 20 mg as needed for coughing/wheezing (then decrease back to 10 mg once daily) 02/25/19   Tanda Rockers, MD  PRESCRIPTION MEDICATION See admin  instructions. [facility] Trilogy ventilator- At bedtime    [provider]  silodosin (RAPAFLO) 4 MG CAPS capsule Take 4 mg by mouth daily. 06/17/19   [provider]  sodium chloride (MURO 128) 5 % ophthalmic solution Place 1 drop into both eyes 4 (four) times daily.    [provider]  spironolactone (ALDACTONE) 25 MG tablet Take 12.5-25 mg by mouth See admin instructions. 12.5 mg on day and 25 mg the next day, pt alternates dosages 07/15/19   [provider]  SYMBICORT 160-4.5 MCG/ACT inhaler Inhale 2 puffs into the lungs 2 (two) times daily. 06/06/19   Tanda Rockers, MD  Tiotropium Bromide Monohydrate (SPIRIVA RESPIMAT) 2.5 MCG/ACT AERS INHALE 2 PUFFS BY MOUTH EVERY DAY Patient taking differently: Inhale 2 puffs into the lungs daily.  05/14/19   Tanda Rockers, MD  vitamin B-12 (CYANOCOBALAMIN) 1000 MCG tablet Take 1,000 mcg by mouth daily.     [provider]    Discontinued Meds:   Medications Discontinued During This Encounter  Medication Reason  . heparin injection 6,834 Units Duplicate  . NON FORMULARY 2 puff Entry Error  . polyethylene glycol (MIRALAX / GLYCOLAX) packet 17 g   . umeclidinium bromide (INCRUSE ELLIPTA) 62.5 MCG/INH 1 puff Patient Preference  . HYDROcodone-acetaminophen (NORCO/VICODIN) 5-325 MG per tablet 1-2 tablet   . levalbuterol (XOPENEX) nebulizer solution 0.63 mg   . furosemide (LASIX) tablet 20 mg   . sodium phosphate (FLEET) 7-19 GM/118ML enema 1 enema Completed Course  . docusate sodium (COLACE) capsule 100 mg   . polyethylene glycol (MIRALAX / GLYCOLAX) packet 17 g   . levalbuterol (XOPENEX) nebulizer solution 0.63 mg   . escitalopram (LEXAPRO) tablet 5 mg   . acetaminophen (TYLENOL) tablet 325-650 mg   . senna-docusate (Senokot-S) tablet 1 tablet   . escitalopram (LEXAPRO) tablet 5 mg   . furosemide (LASIX) tablet 20 mg     Social History:  reports that he quit smoking about 37 years ago. His smoking use included  cigarettes. He has a 66.00 pack-year smoking history. He has never used smokeless tobacco. He reports current alcohol use. He reports that he does not use drugs.  Family History:   Family History  Problem Relation Age of Onset  . Heart disease Mother   . Heart disease Father        Before age 22  . Breast cancer Mother   . Cancer Mother        Breast cancer  . Hypertension Mother   . Other Mother        AAA  and   Amputation  . Heart attack Father   . Stroke Brother        x3, still living   . Peripheral vascular disease Brother   . Heart disease Brother   . Hyperlipidemia Brother   . Hypertension Brother   . Heart disease Brother   . Allergies Brother     Blood pressure 127/71, pulse 88, temperature 98.6 F (37 C), resp. rate 19, height 5\' 9"  (1.753 m), weight 56.3 kg, SpO2 97 %. General appearance: alert, cooperative and appears stated age Head: Normocephalic, without obvious abnormality, atraumatic, hard of hearing Eyes: conjunctivae/corneas clear. PERRL, EOM's intact. Fundi benign. Neck: no adenopathy, no carotid bruit, no JVD, supple, symmetrical, trachea midline and thyroid not enlarged, symmetric, no tenderness/mass/nodules Back: symmetric, no curvature. ROM normal. No CVA tenderness. Chest wall: no tenderness Cardio: regular rate and rhythm GI: soft, non-tender; bowel sounds normal; no masses,  no organomegaly Extremities: left leg wrapped, rt AKA Pulses: 2+ and symmetric Skin: poor skin turgor       Isidor Bromell, Hunt Oris, MD 08/19/2019, 12:41 PM

## 2019-08-20 ENCOUNTER — Inpatient Hospital Stay (HOSPITAL_COMMUNITY): Payer: Medicare Other | Admitting: Occupational Therapy

## 2019-08-20 ENCOUNTER — Encounter (HOSPITAL_COMMUNITY): Payer: Medicare Other | Admitting: Psychology

## 2019-08-20 ENCOUNTER — Inpatient Hospital Stay (HOSPITAL_COMMUNITY): Payer: Medicare Other

## 2019-08-20 ENCOUNTER — Observation Stay (HOSPITAL_COMMUNITY)
Admission: AD | Admit: 2019-08-20 | Discharge: 2019-08-21 | Disposition: A | Discharge status: Home / Self Care | Payer: Medicare Other | Source: Ambulatory Visit | Attending: Family Medicine | Admitting: Family Medicine

## 2019-08-20 DIAGNOSIS — I5032 Chronic diastolic (congestive) heart failure: Secondary | ICD-10-CM | POA: Diagnosis not present

## 2019-08-20 DIAGNOSIS — J9612 Chronic respiratory failure with hypercapnia: Secondary | ICD-10-CM | POA: Insufficient documentation

## 2019-08-20 DIAGNOSIS — E8809 Other disorders of plasma-protein metabolism, not elsewhere classified: Secondary | ICD-10-CM | POA: Diagnosis not present

## 2019-08-20 DIAGNOSIS — Z9981 Dependence on supplemental oxygen: Secondary | ICD-10-CM | POA: Insufficient documentation

## 2019-08-20 DIAGNOSIS — J9611 Chronic respiratory failure with hypoxia: Secondary | ICD-10-CM | POA: Insufficient documentation

## 2019-08-20 DIAGNOSIS — E871 Hypo-osmolality and hyponatremia: Secondary | ICD-10-CM | POA: Diagnosis not present

## 2019-08-20 DIAGNOSIS — Z951 Presence of aortocoronary bypass graft: Secondary | ICD-10-CM | POA: Insufficient documentation

## 2019-08-20 DIAGNOSIS — D62 Acute posthemorrhagic anemia: Secondary | ICD-10-CM | POA: Diagnosis not present

## 2019-08-20 DIAGNOSIS — R7401 Elevation of levels of liver transaminase levels: Secondary | ICD-10-CM | POA: Insufficient documentation

## 2019-08-20 DIAGNOSIS — Z79899 Other long term (current) drug therapy: Secondary | ICD-10-CM | POA: Insufficient documentation

## 2019-08-20 DIAGNOSIS — R0602 Shortness of breath: Secondary | ICD-10-CM | POA: Diagnosis not present

## 2019-08-20 DIAGNOSIS — I110 Hypertensive heart disease with heart failure: Secondary | ICD-10-CM | POA: Insufficient documentation

## 2019-08-20 DIAGNOSIS — R339 Retention of urine, unspecified: Secondary | ICD-10-CM | POA: Diagnosis not present

## 2019-08-20 DIAGNOSIS — N401 Enlarged prostate with lower urinary tract symptoms: Secondary | ICD-10-CM

## 2019-08-20 DIAGNOSIS — Z87891 Personal history of nicotine dependence: Secondary | ICD-10-CM | POA: Insufficient documentation

## 2019-08-20 DIAGNOSIS — Z89611 Acquired absence of right leg above knee: Secondary | ICD-10-CM | POA: Diagnosis not present

## 2019-08-20 DIAGNOSIS — Z7982 Long term (current) use of aspirin: Secondary | ICD-10-CM | POA: Insufficient documentation

## 2019-08-20 DIAGNOSIS — Z85828 Personal history of other malignant neoplasm of skin: Secondary | ICD-10-CM | POA: Insufficient documentation

## 2019-08-20 DIAGNOSIS — G8918 Other acute postprocedural pain: Secondary | ICD-10-CM | POA: Diagnosis not present

## 2019-08-20 DIAGNOSIS — I5022 Chronic systolic (congestive) heart failure: Secondary | ICD-10-CM | POA: Diagnosis not present

## 2019-08-20 DIAGNOSIS — J432 Centrilobular emphysema: Secondary | ICD-10-CM | POA: Diagnosis not present

## 2019-08-20 LAB — SODIUM
Sodium: 120 mmol/L — ABNORMAL LOW (ref 135–145)
Sodium: 122 mmol/L — ABNORMAL LOW (ref 135–145)

## 2019-08-20 LAB — BASIC METABOLIC PANEL
Anion gap: 9 (ref 5–15)
Anion gap: 9 (ref 5–15)
Anion gap: 7 (ref 5–15)
BUN: 16 mg/dL (ref 8–23)
BUN: 18 mg/dL (ref 8–23)
BUN: 16 mg/dL (ref 8–23)
CO2: 27 mmol/L (ref 22–32)
CO2: 27 mmol/L (ref 22–32)
CO2: 28 mmol/L (ref 22–32)
Calcium: 8.5 mg/dL — ABNORMAL LOW (ref 8.9–10.3)
Calcium: 8.6 mg/dL — ABNORMAL LOW (ref 8.9–10.3)
Calcium: 8.4 mg/dL — ABNORMAL LOW (ref 8.9–10.3)
Chloride: 84 mmol/L — ABNORMAL LOW (ref 98–111)
Chloride: 84 mmol/L — ABNORMAL LOW (ref 98–111)
Chloride: 84 mmol/L — ABNORMAL LOW (ref 98–111)
Creatinine, Ser: 0.78 mg/dL (ref 0.61–1.24)
Creatinine, Ser: 0.79 mg/dL (ref 0.61–1.24)
Creatinine, Ser: 0.81 mg/dL (ref 0.61–1.24)
GFR calc Af Amer: >60 mL/min (ref >60)
GFR calc Af Amer: >60 mL/min (ref >60)
GFR calc Af Amer: >60 mL/min (ref >60)
GFR calc non Af Amer: >60 mL/min (ref >60)
GFR calc non Af Amer: >60 mL/min (ref >60)
GFR calc non Af Amer: >60 mL/min (ref >60)
Glucose, Bld: 89 mg/dL (ref 70–99)
Glucose, Bld: 124 mg/dL — ABNORMAL HIGH (ref 70–99)
Glucose, Bld: 161 mg/dL — ABNORMAL HIGH (ref 70–99)
Potassium: 4.2 mmol/L (ref 3.5–5.1)
Potassium: 4.8 mmol/L (ref 3.5–5.1)
Potassium: 4.3 mmol/L (ref 3.5–5.1)
Sodium: 120 mmol/L — ABNORMAL LOW (ref 135–145)
Sodium: 120 mmol/L — ABNORMAL LOW (ref 135–145)
Sodium: 119 mmol/L — CL (ref 135–145)

## 2019-08-20 LAB — URINE CULTURE

## 2019-08-20 LAB — ALBUMIN: Albumin: 2.9 g/dL — ABNORMAL LOW (ref 3.5–5.0)

## 2019-08-20 LAB — OSMOLALITY: Osmolality: 260 mOsm/kg — ABNORMAL LOW (ref 275–295)

## 2019-08-20 LAB — T4, FREE: Free T4: 0.89 ng/dL (ref 0.61–1.12)

## 2019-08-20 MED ORDER — SODIUM CHLORIDE 3 % IV BOLUS
75.0000 mL | Freq: Once | INTRAVENOUS | Status: DC
  Filled 2019-08-20 (×3): qty 75

## 2019-08-20 MED ORDER — DESMOPRESSIN ACETATE 4 MCG/ML IJ SOLN
1.0000 ug | Freq: Two times a day (BID) | INTRAMUSCULAR | Status: DC
  Administered 2019-08-20: 1 ug via SUBCUTANEOUS
  Filled 2019-08-20: qty 1

## 2019-08-20 MED ORDER — FUROSEMIDE 20 MG PO TABS
20.0000 mg | ORAL_TABLET | Freq: Two times a day (BID) | ORAL | Status: DC
  Administered 2019-08-20: 20 mg via ORAL
  Filled 2019-08-20: qty 1

## 2019-08-20 MED ORDER — TOLVAPTAN 15 MG PO TABS
15.0000 mg | ORAL_TABLET | ORAL | Status: DC
  Administered 2019-08-20: 15 mg via ORAL
  Filled 2019-08-20: qty 1

## 2019-08-20 MED ORDER — SODIUM CHLORIDE 3 % IV SOLN
INTRAVENOUS | Status: DC
  Filled 2019-08-20 (×2): qty 500

## 2019-08-20 MED ORDER — FUROSEMIDE 20 MG PO TABS: 20.0000 mg | ORAL_TABLET | Freq: Two times a day (BID) | ORAL | Status: DC

## 2019-08-20 MED ORDER — CHLORHEXIDINE GLUCONATE CLOTH 2 % EX PADS: 6.0000 | MEDICATED_PAD | Freq: Two times a day (BID) | CUTANEOUS | Status: DC

## 2019-08-20 NOTE — Progress Notes (Signed)
Patient difficult cath due to enlarged prostate and has had trauma with hematuria and pain with at I/O attempts despite use of coude catheter. Wife has questions bout goals and is not open to doing I/O caths after discharge. Foley replaced.

## 2019-08-20 NOTE — H&P (Deleted)
  The note originally documented on this encounter has been moved the the encounter in which it belongs.  

## 2019-08-20 NOTE — Plan of Care (Signed)
  Problem: Consults Goal: RH LIMB LOSS PATIENT EDUCATION Description: Description: See Patient Education module for eduction specifics. Outcome: Progressing Goal: Skin Care Protocol Initiated - if Braden Score 18 or less Description: If consults are not indicated, leave blank or document N/A Outcome: Progressing Goal: Nutrition Consult-if indicated Outcome: Progressing   Problem: RH BOWEL ELIMINATION Goal: RH STG MANAGE BOWEL WITH ASSISTANCE Description: STG Manage Bowel with mod Assistance. Outcome: Progressing Goal: RH STG MANAGE BOWEL W/MEDICATION W/ASSISTANCE Description: STG Manage Bowel with Medication with mod Assistance. Outcome: Progressing   Problem: RH BLADDER ELIMINATION Goal: RH STG MANAGE BLADDER WITH ASSISTANCE Description: STG Manage Bladder With  mod I Assistance Outcome: Progressing Goal: RH STG MANAGE BLADDER WITH MEDICATION WITH ASSISTANCE Description: STG Manage Bladder With Medication With mod I Assistance. Outcome: Progressing Goal: RH STG MANAGE BLADDER WITH EQUIPMENT WITH ASSISTANCE Description: STG Manage Bladder With Equipment With mod  I Assistance Outcome: Progressing   Problem: RH SKIN INTEGRITY Goal: RH STG SKIN FREE OF INFECTION/BREAKDOWN Description: Pt will be aware of care of wound and infection prevention measures with mod I assist use of air mattress Outcome: Progressing Goal: RH STG MAINTAIN SKIN INTEGRITY WITH ASSISTANCE Description: STG Maintain Skin Integrity With mod I Assistance. Outcome: Progressing Goal: RH STG ABLE TO PERFORM INCISION/WOUND CARE W/ASSISTANCE Description: STG Able To Perform Incision/Wound Care With Mod I Assistance. Outcome: Progressing   Problem: RH SAFETY Goal: RH STG ADHERE TO SAFETY PRECAUTIONS W/ASSISTANCE/DEVICE Description: STG Adhere to Safety Precautions With mod  I/ cues/reminders Assistance/Devices as recommended by therapy Outcome: Progressing   Problem: RH PAIN MANAGEMENT Goal: RH STG PAIN  MANAGED AT OR BELOW PT'S PAIN GOAL Description: Pain <=4/10 Outcome: Progressing

## 2019-08-20 NOTE — Progress Notes (Signed)
NAME:  [name], MRN:  [id number], DOB:  07-06-1929, LOS: 6 ADMISSION DATE:  08/14/2019, CONSULTATION DATE: 08/19/2019 REFERRING MD:  Dr. Posey Pronto, CHIEF COMPLAINT:  Short of breath   Brief History   86 yo male former smoker with COPD and chronic hypoxic/hypercapnic respiratory failure on 2 liters oxygen and Trilogy home vent had non-healing Rt foot wound and had Rt AKA on 08/11/19 by vascular surgery.  He was admitted to [facility] for rehab on 08/14/19.  He had difficulty using Trilogy/Bipap in CIR and reported more sense of dyspnea.  PCCM asked to assess.  History of present illness   He is followed by Dr. [name] in pulmonary office and had 2nd opinion at [facility].  He is on chronic prednisone and zithromax.  He uses 2 liters oxygen at baseline.  He has been on Trilogy home vent at night for hypercapnia.    He had Rt AKA.  He was transferred to CIR.  He developed lethargy and confusion.  He was found to have hyponatremia.  He has been seen by nephrology.  He has not had much cough or sputum.  He has chronic sinus congestion.  Not having wheeze.  No documented fever.  White count normal.  He is on baseline 2 liters.  Has been using Trilogy at night and not having any trouble with pressure setting.  Past Medical History  Influenza A January 2019, CAD s/p CABG, Diastolic CHF, PAD, HLD, Vit D deficiency, BPH, HTN  Significant Hospital Events   8/02 Rt AKA 8/05 Transfer to CIR  Consults:  Vascular surgery Nephrology  Procedures:    Significant Diagnostic Tests:   PFT 03/26/08 >> FEV1 0.94 (34%), FEV1% 30, TLC 6.98 (109%), DLCO 38%, +BD  PFT 06/08/15 >> FEV1 0.58 (22%), FEV1% 27, TLC 6.87 (99%), DLCO 9%, +BD  A1AT 03/01/17 >> 124, MM  ABG on 2 liters 04/04/17 >> pH 7.41, PCO2 49.1, PO2 93.3  CT angio chest 03/08/17 >> apical scarring, calcified granulomas, mod/severe centrilobular emphysema, basilar scarring  Micro Data:  Urine 8/10 >> multiple species  Antimicrobials:  Chronic  azithromycin  Interim history/subjective:  Intermittent episodes of dyspnea that improve with xopenex.  No cough.  Objective   Blood pressure 131/73, pulse 79, temperature 98.7 F (37.1 C), temperature source Oral, resp. rate 18, height 5\' 9"  (1.753 m), weight 56.3 kg, SpO2 100 %.        Intake/Output Summary (Last 24 hours) at 08/20/2019 1525 Last data filed at 08/20/2019 7989 Gross per 24 hour  Intake 657.21 ml  Output 1500 ml  Net -842.79 ml   Filed Weights   08/18/19 0507 08/19/19 0500 08/20/19 0558  Weight: 55.7 kg 56.3 kg 56.3 kg    Examination:  General - sleepy Eyes - pupils reactive ENT - no sinus tenderness, no stridor Cardiac - regular rate/rhythm, no murmur Chest - decreased BS Abdomen - soft, non tender, + bowel sounds Extremities - Rt AKA Skin - no rashes Neuro - follows commands   Assessment & Plan:   Dyspnea with hx of steroid dependent and antibiotic dependent COPD and chronic hypoxic/hypercapnic respiratory failure. - goal SpO2 90 to 95% - continue trilogy at night - continue nebulized pulmicort, brovana, yupelri in place of symbicort and spiriva for now - prn xopenex - continue prednisone 10 mg, zithromax 250 mg  Hyponatremia. - per nephrology  Elevated TSH, low cortisol. - f/u T3, T4  PAD s/p Rt AKA. - post op care per VVS and PMR  Best  practice:  Diet: heart healthy DVT prophylaxis: Lovenox GI prophylaxis: Pepcid Mobility: Per PMR Code Status: DNI Family Communication: Updated pt's wife at bedside  Labs    CMP Latest Ref Rng & Units 08/20/2019 08/20/2019 08/20/2019  Glucose 70 - 99 mg/dL 124(H) - 89  BUN 8 - 23 mg/dL 18 - 16  Creatinine 0.61 - 1.24 mg/dL 0.79 - 0.78  Sodium 135 - 145 mmol/L 120(L) 120(L) 120(L)  Potassium 3.5 - 5.1 mmol/L 4.8 - 4.2  Chloride 98 - 111 mmol/L 84(L) - 84(L)  CO2 22 - 32 mmol/L 27 - 27  Calcium 8.9 - 10.3 mg/dL 8.6(L) - 8.5(L)  Total Protein 6.5 - 8.1 g/dL - - -  Total Bilirubin 0.3 - 1.2 mg/dL - -  -  Alkaline Phos 38 - 126 U/L - - -  AST 15 - 41 U/L - - -  ALT 0 - 44 U/L - - -    CBC Latest Ref Rng & Units 08/18/2019 08/15/2019 08/14/2019  WBC 4.0 - 10.5 K/uL 8.5 8.6 11.2(H)  Hemoglobin 13.0 - 17.0 g/dL 9.0(L) 8.8(L) 9.1(L)  Hematocrit 39 - 52 % 27.2(L) 27.2(L) 28.3(L)  Platelets 150 - 400 K/uL 322 279 257    Lab Results  Component Value Date   TSH 12.163 (H) 08/19/2019    Signature:  Chesley Mires, MD [place] Pager - [phone] 08/20/2019, 3:25 PM

## 2019-08-20 NOTE — Patient Care Conference (Signed)
Inpatient RehabilitationTeam Conference and Plan of Care Update Date: 08/20/2019   Time: 11:43 AM    Patient Name: [name]      Medical Record Number: [id number]  Date of Birth: 06-05-1929 Sex: Male         Room/Bed: 4W11C/4W11C-01 Payor Info: Payor: MEDICARE / Plan: MEDICARE PART A AND B / Product Type: *No Product type* /    Admit Date/Time:  08/14/2019  3:22 PM  Primary Diagnosis:  S/P AKA (above knee amputation) [facility])  Hospital Problems: Principal Problem:   S/P AKA (above knee amputation) ([place]) Active Problems:   Hypoalbuminemia due to protein-calorie malnutrition (HCC)   Transaminitis   Acute blood loss anemia   Hyponatremia   Postoperative pain   Supplemental oxygen dependent   SOB (shortness of breath)   Chronic systolic CHF (congestive heart failure) [facility])    Expected Discharge Date: Expected Discharge Date: 09/02/19  Team Members Present: Physician leading conference: Dr. Delice Lesch Care Coodinator Present: Dorien Chihuahua, RN, BSN, CRRN;Christina Sampson Goon, BSW Nurse Present: Other (comment) Antonieta Pert) PT Present: Becky Sax, PT OT Present: [name], OT PPS Coordinator present : Ileana Ladd, Burna Mortimer, SLP     Current Status/Progress Goal Weekly Team Focus  Bowel/Bladder   Foley removed, requires I/O caths q4-6. Continent of bowel, LBM 8/10  Be able to void w/o I/O caths  Assess toileting q shift and prn   Swallow/Nutrition/ Hydration             ADL's   min-mod stand pivot vs squat pivot transfers, min assist sit > stand, mod-max assist LB dressing due to decreased arousal/engagement last couple days  Supervision overall, CGA tub/shower transfers and sit > stand - will most likely need to downgrade all dynamic standing activities (bathing, LB dressing, toileting, transfers to CGA)  ADL retraining, transfers, sit > stand, dynamic standing balance, care for residual limb   Mobility   bed mobility min/mod A, lateral scoot transfers  min A, stand<>pivot transfers mod A, WC mobility 23ft supervision  CGA, Mod I WC mobility  functional mobility/transfers, amputee education, generalized strengthening, dynamic sitting/standing balance/coordination, imporved activity tolerance.   Communication             Safety/Cognition/ Behavioral Observations            Pain   C/O right stump pain and left medial heel pain. Has prns and scheduled meds  Pain less than 5  Assess pain q shift and prn   Skin   Right AKA, staples in place, brusing and bogginess noted (daily dressing changes). Brusing on abdomen & bilateral arms.   Prevent further skin breakdown, monitor for s/s of infections  Assess skiin q shift and prn     Discharge Planning:  Goal to discharge home at supervision level with LRAD (15 steps to get to bedroom, no bath on 1st floor)   Team Discussion: More arousable than previously noted however note breathing issues and shortness of breath and lethargy. Progress limited by fatigue and decreased motivation, and depression, anxiety.  Requiring intermittent catheterizations  Patient on target to meet rehab goals: no, MD placed nephrology consult for recommendations of care.  *See Care Plan and progress notes for long and short-term goals.   Revisions to Treatment Plan:  Therapy downgraded goals from CGA to Min/Mod assist Teaching Needs: Transfers, incision care, intermittent catheterizations, toileting, ADL assistance and medications, etc.   Current Barriers to Discharge: Decreased caregiver support, Home enviroment access/layout, Wound care and Behavior  Possible Resolutions to Barriers: Neuropsych referral Wife given list of resources for home hired caregivers, stair lift installation and SNFs     Medical Summary Current Status: Impaired mobility and ADLs secondary to right AKA  Barriers to Discharge: Medical stability;Other (comments)  Barriers to Discharge Comments: Significant SOB, severe  hyponatremia Possible Resolutions to [employer] Focus: Therapies, pulm recs, nephro recs, may need to be transfused due to acute medical issues   Continued Need for Acute Rehabilitation Level of Care: The patient requires daily medical management by a physician with specialized training in physical medicine and rehabilitation for the following reasons: Direction of a multidisciplinary physical rehabilitation program to maximize functional independence : Yes Medical management of patient stability for increased activity during participation in an intensive rehabilitation regime.: Yes Analysis of laboratory values and/or radiology reports with any subsequent need for medication adjustment and/or medical intervention. : Yes   I attest that I was present, lead the team conference, and concur with the assessment and plan of the team.   Dorien Chihuahua B 08/20/2019, 3:37 PM

## 2019-08-20 NOTE — Progress Notes (Deleted)
Inpatient Rehabilitation Care Coordinator  Discharge Note  The overall goal for the admission was met for:   Discharge location: Yes, acute  Length of Stay: Yes, 6 Days  Discharge activity level: Yes  Home/community participation: Yes  Services provided included: MD, RD, PT, OT, SLP, RN, CM, TR, Pharmacy, Neuropsych and SW  Financial Services: Medicare  Follow-up services arranged: Other: Patient Transitioning to Acute  and Patient/Family has no preference for HH/DME agencies  Comments (or additional information):  Patient/Family verbalized understanding of follow-up arrangements: Yes  Individual responsible for coordination of the follow-up plan: [name] [phone]  Confirmed correct DME delivered: Dyanne Iha 08/20/2019    Dyanne Iha

## 2019-08-20 NOTE — Progress Notes (Signed)
Physical Therapy Session Note  Patient Details  Name: [name] MRN: [id number] Date of Birth: September 07, 1929  Today's Date: 08/20/2019 PT Individual Time: 8937-3428 and 1400-1444 PT Individual Time Calculation (min): 24 min and 44 min  Short Term Goals: Week 1:  PT Short Term Goal 1 (Week 1): pt will perform bed mobility with supervision PT Short Term Goal 2 (Week 1): Pt will transfer sit<>stand with LRAD mod A PT Short Term Goal 3 (Week 1): Pt will transfer bed<>chair with LRAD min A  Skilled Therapeutic Interventions/Progress Updates:   Treatment Session 1: 1100-1124 24 min Received pt sitting in WC, pt agreeable to therapy, and reported pain 3/10 in R residual limb (premedicated). Repositioning, rest breaks, and distraction done to reduce pain levels. Pt stated "I feel completely wasted, I'm having trouble breathing and speaking." O2 sat seated in WC at rest 93% increasing to 97% with cues for pursed lip breathing techniques. Session with emphasis on dynamic sitting balance/coordination, and improved activity tolerance. Pt on 2L O2 via [place] throughout session and able to maintain O2 sat >93%. Pt reported feeling cold and donned jacket mod A. Pt requested to use inhaler and able to take 2 puffs independently. Pt performed the following exercises sitting in [facility] with supervision with verbal cues for technique: -hip flexion x 10 bilaterally -L knee extension x10 -hip adduction pillow squeezes x10 -L hamstring curls 1x4 and 1x5 Concluded session with pt sitting in WC, needs within reach, and wife present at bedside. RN planning on getting pt back into bed.   Treatment Session 2: 7681-1572 44 min Received pt supine in bed, pt agreeable to therapy after extensive convincing, and reported pain in frontal sinuses but did not state pain number (premedicated). Therapist attempted to show pt pressure point areas to relieve discomfort but pt with difficult understanding and ultimately reported no relief.  Session with emphasis on functional mobility/transfers, dynamic sitting balance/coordination, and improved activity tolerance. Pt on 2 L O2 via [place] throughout session and able to maintain O2 sat >96% but continues to report difficulty breathing (RN notified). Pt initially not wanting to participate, however ultimately agreed to sit EOB and eat lunch with encouragement from pt's wife and PT. Pt transferred supine<>sitting EOB with max A with HOB elevated with decreased participation in transfer. Noted pt's shrinker falling off multiple times during session and therapist assisted with donning. Donned vest mod A for time management purposes as pt reported he was "freezing". Pt worked on dynamic sitting balance sitting EOB ~30 minutes while eating lunch and performing exercises. Therapist assisted with setting up lunch tray and re-heated food and prepared coffee for pt. Pt ate ~25% of lunch and stated "the food here is terrible" and refused to eat any more. Pt performed the following exercises sitting EOB with supervision, increased time, and verbal cues for technique: -overhead shoulder press unweighted x8 -dynamic diagonal reaching x8 -L knee extension x10 Pt refused standing activites stating "I'm going to fall" and reported being too fatigued. Noted pt falling asleep mid-conversation and leaning laterally requiring min A and verbal stimuli to arouse. Pt transferred sit<>supine with mod A and reported SOB however O2 sat 97% on 2L O2; PT notified RN. Concluded session with pt supine in bed, needs within reach, and bed alarm on. [name] present at bedside.   Therapy Documentation Precautions:  Precautions Precautions: Fall Precaution Comments: R AKA Restrictions Weight Bearing Restrictions: Yes RLE Weight Bearing: Non weight bearing Other Position/Activity Restrictions: On 2L O2  Therapy/Group:  Individual Therapy Alfonse Alpers PT, DPT   08/20/2019, 7:25 AM

## 2019-08-20 NOTE — Progress Notes (Addendum)
[place] PHYSICAL MEDICINE & REHABILITATION PROGRESS NOTE  Subjective/Complaints: Patient seen sitting up in bed this morning. He states he did not stay well overnight. He complains of nightmares and? Association with febrile status. Wife not present this morning. He was seen by nephrology, vascular surgery, critical care yesterday, notes reviewed-no changes per vascular, nebulizers per pulmonology, labs and meds per nephrology. Patient with questions regarding progress and prognosis. His pocket talker is across the room, encourage pt to keep at all times. He has questions regarding antidepressant.   ROS: + Shortness of breath.  Denies CP, N/V/D  Objective: Vital Signs: Blood pressure 131/73, pulse 79, temperature 98.7 F (37.1 C), temperature source Oral, resp. rate 18, height 5\' 9"  (1.753 m), weight 56.3 kg, SpO2 100 %. DG Chest 2 View  Result Date: 08/18/2019 CLINICAL DATA:  Shortness of breath. EXAM: CHEST - 2 VIEW COMPARISON:  PA and lateral chest 06/27/2019. FINDINGS: The patient has new small bilateral pleural effusions. The lungs are markedly emphysematous but clear. Heart size is normal. Aortic atherosclerosis is noted. The patient is status post CABG. No acute or focal bony abnormality. IMPRESSION: Small bilateral pleural effusions.  The lungs are clear. Aortic Atherosclerosis (ICD10-I70.0) and Emphysema (ICD10-J43.9). Electronically Signed   By: Inge Rise M.D.   On: 08/18/2019 11:58   DG Abd 1 View  Result Date: 08/19/2019 CLINICAL DATA:  Abdominal pain EXAM: ABDOMEN - 1 VIEW COMPARISON:  12/03/2017 FINDINGS: Nonobstructive bowel gas pattern. Moderate-sized rectal stool bal. 8 mm calcification projects over the right renal shadow. No other significant radiographic abnormality are seen. Aortoiliac atherosclerosis. IMPRESSION: 1. Nonobstructive bowel gas pattern. 2. Moderate-sized rectal stool burden. 3. Right-sided nephrolithiasis. Electronically Signed   By: Davina Poke D.O.    On: 08/19/2019 12:40   Recent Labs    08/18/19 0438  WBC 8.5  HGB 9.0*  HCT 27.2*  PLT 322   Recent Labs    08/19/19 1927 08/20/19 0624  NA 119* 120*  K 4.7 4.2  CL 82* 84*  CO2 30 27  GLUCOSE 110* 89  BUN 18 16  CREATININE 0.81 0.78  CALCIUM 8.5* 8.5*    Physical Exam: BP 131/73   Pulse 79   Temp 98.7 F (37.1 C) (Oral)   Resp 18   Ht 5\' 9"  (1.753 m)   Wt 56.3 kg   SpO2 100%   BMI 18.33 kg/m  Constitutional: No distress . Vital signs reviewed. HENT: Normocephalic.  Atraumatic. Eyes: EOMI. No discharge. Cardiovascular: No JVD. Regular rate, premature contraction.  Respiratory: Normal effort.  No stridor. Decreased air movement. +[place]. GI: Non-distended. BS+. Skin: Right AKA with staples CDI Ecchymosis on posterior flap Frial skin Psych: Anxious. Musc: Right AKA with edema and tenderness Left sided edema improving Neuro: Alert Extreme HOH Motor: Bilateral upper extremities: 4/5 proximal distally  Left lower extremity: 3+/5 proximal distal Right lower extremity: Hip flexion, knee extension 3+/5 (some pain patient), unchanged  Assessment/Plan: 1. Functional deficits secondary to right AKA which require 3+ hours per day of interdisciplinary therapy in a comprehensive inpatient rehab setting.  Physiatrist is providing close team supervision and 24 hour management of active medical problems listed below.  Physiatrist and rehab team continue to assess barriers to discharge/monitor patient progress toward functional and medical goals  Care Tool:  Bathing  Bathing activity did not occur: Refused Body parts bathed by patient: Right arm, Left arm, Chest, Abdomen, Front perineal area, Face, Right upper leg   Body parts bathed by helper: Buttocks, Left  upper leg, Left lower leg Body parts n/a: Right lower leg   Bathing assist Assist Level: Moderate Assistance - Patient 50 - 74%     Upper Body Dressing/Undressing Upper body dressing   What is the patient  wearing?: Pull over shirt    Upper body assist Assist Level: Contact Guard/Touching assist    Lower Body Dressing/Undressing Lower body dressing      What is the patient wearing?: Pants     Lower body assist Assist for lower body dressing: Moderate Assistance - Patient 50 - 74%     Toileting Toileting    Toileting assist Assist for toileting: Moderate Assistance - Patient 50 - 74%     Transfers Chair/bed transfer  Transfers assist     Chair/bed transfer assist level: Minimal Assistance - Patient > 75% (lateral scoot) Chair/bed transfer assistive device: Walker, Armrests   Locomotion Ambulation   Ambulation assist   Ambulation activity did not occur: Safety/medical concerns (R AKA, generalized weakness, decreased balance/postural control, poor endurance, fear)          Walk 10 feet activity   Assist  Walk 10 feet activity did not occur: Safety/medical concerns (R AKA, generalized weakness, decreased balance/postural control, poor endurance, fear)        Walk 50 feet activity   Assist Walk 50 feet with 2 turns activity did not occur: Safety/medical concerns (R AKA, generalized weakness, decreased balance/postural control, poor endurance, fear)         Walk 150 feet activity   Assist Walk 150 feet activity did not occur: Safety/medical concerns (R AKA, generalized weakness, decreased balance/postural control, poor endurance, fear)         Walk 10 feet on uneven surface  activity   Assist Walk 10 feet on uneven surfaces activity did not occur: Safety/medical concerns (R AKA, generalized weakness, decreased balance/postural control, poor endurance, fear)         Wheelchair     Assist Will patient use wheelchair at discharge?: Yes Type of Wheelchair: Manual    Wheelchair assist level: Supervision/Verbal cueing Max wheelchair distance: 71ft    Wheelchair 50 feet with 2 turns activity    Assist        Assist Level: Moderate  Assistance - Patient 50 - 74%   Wheelchair 150 feet activity     Assist     Assist Level: Maximal Assistance - Patient 25 - 49%      Medical Problem List and Plan: 1.  Impaired mobility and ADLs secondary to right AKA  Continue CIR, 15/7  May need to transfer patient if no improvement in Na today  Team conference today to discuss current and goals and coordination of care, home and environmental barriers, and discharge planning with nursing, case manager, and therapies. Please see conference note from today as well.  2.  Antithrombotics: -DVT/anticoagulation:  Pharmaceutical: Heparin             -antiplatelet therapy: Aspirin 81mg  daily 3. Pain Management: Has residual limb pain--educate on desensitization techniques.   Norco will be discontinued. Continue tramadol   Lidoderm patch added for left heel on 8/9  Relatively controlled on 8/11  Monitor with increased exertion. 4. Mood: Appears depressed--team to provide ego support. LCSW to follow for evaluation and support.    Will hold off on antidepressant given sodium levels   Wife may spend the night to assist with patient compliance and anxiety.             -antipsychotic  agents: N/A 5. Neuropsych: This patient may be capable of making decisions on his own behalf. Intermittently disoriented.  6. Skin/Wound Care:  Monitor wound for healing.    Stump shrinker for edema control  7. Fluids/Electrolytes/Nutrition: Monitor I/Os 8.COPD/chronic hypoxic respiratory failure:   Continues to require supplemental oxygen (baseline)  Chest x-ray personally reviewed, mild effusion  ECG personally reviewed, showing premature complexes  Appreciate Pulm recs  Need to continue to stress compliance 9. BPH/renal calculi:   Foley DC'd, UA, urine culture with multiple species  I/O caths  Continue finasteride.  10. Anxiety d/o: continue Xanax tid prn. 11. Hyponatremia:   Sodium 120 on 8/11  Appreciate Nephro recs, unable to order 3% NaCl on  rehab, discussed with Neprho  See #15 12. ABLA:   Hemoglobin 9.0 on 8/9 13. Nasal congestion: Afrin nasal spray X 3 days.  Added saline spray as well as humidification.   14. HTN: Has history of orthostatic hypotension.  Monitor BP   Continue lasix bid, aldactone 12.5/25 mgQOD and Toprol XL.   Controlled on 8/11  Monitor with increased exertion 15. CAD/Chronic systolic CHF: Monitor for symptoms with increase in activity. On aldactone, toprol XL, ASA  and Lipitor Filed Weights   08/18/19 0507 08/19/19 0500 08/20/19 0558  Weight: 55.7 kg 56.3 kg 56.3 kg   Lasix changed to daily on 8/9 secondary to hyponatremia, increased back to twice daily on 8/10  Stable on 8/11 16.  Transaminitis  LFTs elevated on 8/6  Continue to monitor 17.  Severe hypoalbuminemia  Supplement initiated on 8/6, repeat labs ordered  Will consider infusion 18.  Chronic sinusitis according to patient's wife has been seen at [facility] who recommended a 25-month course of azithromycin.    Resumed at 250 mg/day  >35 minutes spent with patient in counseling and coordination of care regarding aforementioned due to severity of illness.  LOS: 6 days A FACE TO FACE EVALUATION WAS PERFORMED  Breshay Ilg Lorie Phenix 08/20/2019, 10:27 AM

## 2019-08-20 NOTE — Progress Notes (Signed)
Occupational Therapy Session Note  Patient Details  Name: [name] MRN: [id number] Date of Birth: [birth year]  Today's Date: 08/20/2019 OT Individual Time: 8372-9021 OT Individual Time Calculation (min): 57 min    Short Term Goals: Week 1:  OT Short Term Goal 1 (Week 1): Patient will complete 1/3 parts of LB dressing task seated EOB. OT Short Term Goal 2 (Week 1): Patient will complete LB bathing seated EOB with Min A. OT Short Term Goal 3 (Week 1): Patient will complete lateral scoot transfer to drop-arm BSC with Min A. OT Short Term Goal 4 (Week 1): Patient will complete toileting/hygiene/clothing management with Min A and lateral leans seated on BSC.  Skilled Therapeutic Interventions/Progress Updates:    Treatment session with focus on ADL retraining, sit > stand, and functional transfers.  Pt received sitting upright in bed, appearing more alert and much more conversant this session.  Engaged in discussion with pt about current status as pt reports he may be transferred back to the acute hospital.  Discussed current POC and recommendation to continue therapy and to increase OOB activity/tolerance during daytime to increase sleep at night time.  Also discussed need to continue with breathing treatments as ordered, pt in agreement.  Pt agreeable to bathing and dressing this session.  Pt completed bed mobility with min assist and completed squat pivot transfer to w/c with min assist with facilitation for anterior weight shift.  Engaged in LB bathing and dressing seated at sink with cues for sequencing and thoroughness.  Pt reports not having enough strength to maintain standing to attempt to wash buttocks, requesting therapist to complete for him.  Pt completed full upright stand to allow therapist to doff soiled pants, but unable to come to full upright stand during any additional stands during session.  Pt completing push up from chair and allowing therapist to wash buttocks and pull new pants  over hips while pt maintained pushup position.  Pt rewrapped residual limb as dressing and shrinker fell off when changing clothing, therefore educated on wrapping technique and donning of shrinker.  Pt required frequent rest breaks and encouragement to continue attempts during tasks.  Pt remained upright in w/c with seat belt alarm on and all needs in reach.  RN arriving to administer medications.  Therapy Documentation Precautions:  Precautions Precautions: Fall Precaution Comments: R AKA Restrictions Weight Bearing Restrictions: Yes RLE Weight Bearing: Non weight bearing Other Position/Activity Restrictions: On 2L O2 General:   Vital Signs: Therapy Vitals Pulse Rate: 79 BP: 131/73 Oxygen Therapy SpO2: 100 % Pain:  Pt with no c/o pain  Therapy/Group: Individual Therapy  [name] 08/20/2019, 12:23 PM

## 2019-08-20 NOTE — Progress Notes (Addendum)
[facility] Progress Note   86 y.o. male COPD oxygen dependent, BPH with foley for retention, PAF, HTN, CASHD s/p CAVG, PAD with chronic revascularizations leading to a Rt AKA by Dr. Scot Dock. We were consulted for hyponatremia. Na was 136 on 8/2 and since then has been steadily decreasing with normal renal function. Lexapro was last given on day of consultation but was discontinued.  Pt was also treated with Lasix 20mg  twice daily  Since 8/5. He has had very mild nausea only in the am but has phantom pain present.    Assessment/ Plan:   1. Hyponatremia - appears to be hypovolemic on PE and also with increasing HCO3 possibly representing contraction alkalosis. Nausea has been very mild, has sinus drainage, and pain as well which can all lead to ADH release but I am concerned that he may be volume down which can also cause hyponatremia. - Na continued to drop overnight despite  trial with isotonic hydration +  holding diuretics. Concerned this may be  SIADH and  Na can drop more with isotonic fluids. - Urine studies Na 25, osmolality urine 369 blood 259, TSH (12.16), cortisol -> check T3T4  - Unable to give 3% NS + desmo for clamping in rehab and going to ICU will set him further back.  - will give a trial of Tolvaptan -> if no improvement then will have to transfer for 3%.  - Possible offending agents have already been discontinued.  - His Na continued to drop with isotonic fluids which argues again hypovolemic hyponatremia.  I've tried numerous times today to get ahold of the spouse @ the listed cell and also home number to get her son's number. She [place] that I [place] but [place] is not in the chart.  2. PAD s/p rt AKA 3. COPD on O2 and breathing treatments. 4. CASHD 5. CHF (diastolic) 6. HTN 7. CKD3 (BL1-1.1)  Subjective:   Issues with voiding and now getting in and out caths. Denies dyspnea or nausea this am Wife bedside    Objective:   BP 134/76 (BP Location: Right Arm)   Pulse 86   Temp 98.7 F (37.1 C) (Oral)   Resp 18   Ht 5\' 9"  (1.753 m)   Wt 56.3 kg   SpO2 100%   BMI 18.33 kg/m   Intake/Output Summary (Last 24 hours) at 08/20/2019 2725 Last data filed at 08/20/2019 0617 Gross per 24 hour  Intake 597.21 ml  Output 2350 ml  Net -1752.79 ml   Weight change: 0 kg  Physical Exam: GEN: NAD, A&Ox3, HOH HEENT: No conjunctival pallor, EOMI NECK: Supple, no thyromegaly LUNGS: CTA B/L no rales CV: RRR, No M/R/G ABD: SNDNT +BS  EXT: No lower extremity edema    Imaging: DG Chest 2 View  Result Date: 08/18/2019 CLINICAL DATA:  Shortness of breath. EXAM: CHEST - 2 VIEW COMPARISON:  PA and lateral chest 06/27/2019. FINDINGS: The patient has new small bilateral pleural effusions. The lungs are markedly emphysematous but clear. Heart size is normal. Aortic atherosclerosis is noted. The patient is status post CABG. No acute or focal bony abnormality. IMPRESSION: Small bilateral pleural effusions.  The lungs are clear. Aortic Atherosclerosis (ICD10-I70.0) and Emphysema (ICD10-J43.9). Electronically Signed   By: Inge Rise M.D.   On: 08/18/2019 11:58   DG Abd 1 View  Result Date: 08/19/2019 CLINICAL DATA:  Abdominal pain EXAM: ABDOMEN - 1 VIEW COMPARISON:  12/03/2017 FINDINGS: Nonobstructive bowel gas  pattern. Moderate-sized rectal stool bal. 8 mm calcification projects over the right renal shadow. No other significant radiographic abnormality are seen. Aortoiliac atherosclerosis. IMPRESSION: 1. Nonobstructive bowel gas pattern. 2. Moderate-sized rectal stool burden. 3. Right-sided nephrolithiasis. Electronically Signed   By: Davina Poke D.O.   On: 08/19/2019 12:40    Labs: BMET Recent Labs  Lab 08/14/19 0047 08/15/19 0554 08/18/19 0438 08/19/19 0648 08/19/19 1927  NA 130* 129* 125* 123* 119*  K 4.2 4.1 4.0 4.2 4.7  CL 93* 90* 85* 83* 82*  CO2 30 30 31  33* 30  GLUCOSE 92 93 94 91 110*   BUN 18 18 19 17 18   CREATININE 0.89 0.99 1.08 0.87 0.81  CALCIUM 8.5* 8.6* 8.6* 9.0 8.5*   CBC Recent Labs  Lab 08/14/19 0047 08/15/19 0554 08/18/19 0438  WBC 11.2* 8.6 8.5  NEUTROABS 9.2* 6.9  --   HGB 9.1* 8.8* 9.0*  HCT 28.3* 27.2* 27.2*  MCV 96.6 96.5 95.1  PLT 257 279 322    Medications:    . (feeding supplement) PROSource Plus  30 mL Oral BID BM  . acetaminophen  650 mg Oral TID WC & HS  . arformoterol  15 mcg Nebulization BID  . aspirin EC  81 mg Oral Daily  . atorvastatin  20 mg Oral QHS  . azithromycin  250 mg Oral Daily  . budesonide (PULMICORT) nebulizer solution  0.5 mg Nebulization BID  . Chlorhexidine Gluconate Cloth  6 each Topical q morning - 10a  . cycloSPORINE  1 drop Both Eyes BID  . enoxaparin (LOVENOX) injection  40 mg Subcutaneous QHS  . famotidine  20 mg Oral QHS  . feeding supplement (ENSURE ENLIVE)  237 mL Oral BID BM  . finasteride  5 mg Oral Q lunch  . fluticasone  2 spray Each Nare Daily  . furosemide  20 mg Oral BID  . lidocaine  1 patch Transdermal Q24H  . metoprolol succinate  12.5 mg Oral Daily  . multivitamin with minerals  1 tablet Oral Daily  . predniSONE  10 mg Oral q AM  . revefenacin  175 mcg Nebulization Daily  . senna  2 tablet Oral Daily  . sodium chloride  1 drop Both Eyes QID  . spironolactone  12.5 mg Oral QODAY  . spironolactone  25 mg Oral Bland Span, MD 08/20/2019, 7:05 AM

## 2019-08-20 NOTE — Consult Note (Signed)
Neuropsychological Consultation   Patient:   [name]   DOB:   1929-11-09  MR Number:  [id number]  Location:  [facility] [place] [id number] [place] [address] Dept: [place]: [phone]           Date of Service:   08/20/2019  Start Time:   3 PM End Time:   4 PM  Provider/Observer:  Ilean Skill, Psy.D.       Clinical Neuropsychologist       Billing Code/Service: 75643  Chief Complaint:    [name] Geller is an 86 year old male with a history of COPD-chronic hypoxic respiratory failure and 2 L oxygen dependent, BPH, peripheral artery disease, coronary artery disease s/p CABG, PVD with chronic nonhealing wound on right foot and no options for revascularization.  Patient was admitted on 08/11/2018 for right AKA.  Chronic diastolic heart failure being monitored.  Pain control and weakness have continued to be issues along with orthostatic symptoms and overall metabolic status.  Has been admitted to CIR due to functional decline.  Reason for Service:  Patient was referred for neuropsychological consultation due to coping and adjustment issues and coping issues around his recent AKA and dealing with significant medical issues and end-of-life issues.  Below is the HPI for the current admission.  HPI: Travis Zerkle. [name] is an 86 year old male with history of COPD- chronic hypoxic respiratory failure and 2 L oxygen dependent, BPH- foley for retention, PAF, CAD s/p CABG, PVD with chronic nonhealing wound on right foot and no options for revascularizations who was admitted on 08/11/19 for R-AKA by Dr. Scot Dock. Chronic diastolic heart failure being monitored and was placed on 1500cc FR.  Pain control and weakness continued to be an issues. Had issues with orthostatic symptoms with attempts at sitting at EOB. Therapy ongoing and CIR recommended due to functional decline.  Current Status:  Patient was lying  down asleep in bed when I entered the room and his wife woke him up with verbal commands and light touch to his shoulder.  Patient struggled to fully awaken but was able to become fully alert.  Patient has significant hearing loss and utilized a speaker system for amplification.  The patient repeated statements that he had made to prior providers about questioning the ultimate goal of having his leg amputated for pain control and concerns about the struggles for recovery post surgery.  However, the patient reports that as of today he was able to do more things and is feeling more optimistic about getting some extended life with improved quality of life with the surgery.  The patient is still struggling with existential issues and end-of-life issues.  He struggles with difficulty breathing and the fact that he is unable to do most functional issues and now is coping with the recent amputation.  Patient acknowledged issues of anxiety and worry about both his current status as well as future concerns but reports that his anxiety is not to the point that is keeping him from being able to do therapeutic efforts although his overall medical status and frailty are.  Behavioral Observation: [name]  presents as a 86 y.o.-year-old Right Caucasian Male who appeared his stated age. his dress was Appropriate and he was Well Groomed and his manners were Appropriate to the situation.  his participation was indicative of Appropriate and Redirectable behaviors.  There were any physical disabilities noted.  he displayed an  appropriate level of cooperation and motivation.     Interactions:    Active Appropriate and Redirectable  Attention:   abnormal and attention span appeared shorter than expected for age  Memory:   within normal limits; recent and remote memory intact  Visuo-spatial:  not examined  Speech (Volume):  low  Speech:   slurred;   Thought Process:  Coherent and Relevant  Though  Content:  Rumination; not suicidal and not homicidal  Orientation:   person, place, time/date and situation  Judgment:   Fair  Planning:   Fair  Affect:    Anxious  Mood:    Anxious  Insight:   Good  Intelligence:   high  Medical History:   Past Medical History:  Diagnosis Date  . Anxiety   . Arthritis    "generalized" (04/04/2017)  . Asthma   . Basal cell carcinoma    "left ear"  . BPH (benign prostatic hyperplasia)    severe; s/p multiple biopsies  . CAD (coronary artery disease)   . Carotid artery occlusion   . Chronic respiratory failure ([place])   . Chronic rhinitis   . COPD (chronic obstructive pulmonary disease) (H[facility])    2L [place] O2  . Diastolic heart failure ([place]) 2019  . Dilation of biliary tract   . Elevated troponin 03/09/2017  . Gallstones   . GERD (gastroesophageal reflux disease)   . History of blood transfusion    "w/his CABG" (04/04/2017)  . History of kidney stones   . Hyperlipidemia   . Hypertension   . On home oxygen therapy    "~ 24/7" (04/04/2017)  . Peripheral vascular disease ([place])   . Pneumonia 2019  . Squamous cell carcinoma of skin 04/23/2013   in situ-Right flank (txpbx)  . Squamous cell carcinoma of skin 01/23/2017   [place]/ in siut- Left ear rim (CX3FU)  . Syncope and collapse   . Ureteral tumor 08/2015   had endoscopic procedure for evaluation, unable to reach for biopsy  . Urinary catheter (Foley) change required    Psychiatric History:  Patient does have a history of anxiety and has been taking benzodiazepine (Xanax) for at least the past several years.  Family Med/Psych History:  Family History  Problem Relation Age of Onset  . Heart disease Mother   . Heart disease Father        Before age 43  . Breast cancer Mother   . Cancer Mother        Breast cancer  . Hypertension Mother   . Other Mother        AAA  and   Amputation  . Heart attack Father   . Stroke Brother        x3, still living   . Peripheral vascular disease  Brother   . Heart disease Brother   . Hyperlipidemia Brother   . Hypertension Brother   . Heart disease Brother   . Allergies Brother     Impression/DX:  [name] is an 86 year old male with a history of COPD-chronic hypoxic respiratory failure and 2 L oxygen dependent, BPH, peripheral artery disease, coronary artery disease s/p CABG, PVD with chronic nonhealing wound on right foot and no options for revascularization.  Patient was admitted on 08/11/2018 for right AKA.  Chronic diastolic heart failure being monitored.  Pain control and weakness have continued to be issues along with orthostatic symptoms and overall metabolic status.  Has been admitted to CIR due to functional decline.  Patient was  lying down asleep in bed when I entered the room and his wife woke him up with verbal commands and light touch to his shoulder.  Patient struggled to fully awaken but was able to become fully alert.  Patient has significant hearing loss and utilized a speaker system for amplification.  The patient repeated statements that he had made to prior providers about questioning the ultimate goal of having his leg amputated for pain control and concerns about the struggles for recovery post surgery.  However, the patient reports that as of today he was able to do more things and is feeling more optimistic about getting some extended life with improved quality of life with the surgery.  The patient is still struggling with existential issues and end-of-life issues.  He struggles with difficulty breathing and the fact that he is unable to do most functional issues and now is coping with the recent amputation.  Patient acknowledged issues of anxiety and worry about both his current status as well as future concerns but reports that his anxiety is not to the point that is keeping him from being able to do therapeutic efforts although his overall medical status and frailty are.  Disposition/Plan:  I will follow up with  the patient first of next week.  Diagnosis:    R-AKA/CHF        Electronically Signed   _______________________ Ilean Skill, Psy.D.

## 2019-08-20 NOTE — Progress Notes (Signed)
Pt sodium 119, PA updated verbally

## 2019-08-20 NOTE — H&P (Signed)
History and Physical    [name]:[id number] DOB: 1929-12-31 DOA: 08/14/2019  PCP: [name] (Confirm with patient/family/NH records and if not entered, this has to be entered at [name] point of entry) Patient coming from: Rehab unit  I have personally briefly reviewed patient's old medical records in [place]  Chief Complaint: Feeling tired  HPI: [name] is a 86 y.o. male with medical history significant of  COPD and chronic hypoxic/hypercapnic respiratory failure on 2 liters oxygen and Trilogy, chronic urinary retention secondary to BPH, on Foley, PAF, CAD status post CABG, PVD with chronic nonhealing wound on the right foot, was in rehab unit after Rt AKA on 08/11/19 by vascular surgery.    In rehab unit, patient also has been treating for mild COPD exacerbation with p.o. steroids and antibiotics.  Patient baseline sodium level 136 on 08/02, and since then gradually sodium level has been decreasing with normal and stable kidney function.  Patient was started on Lasix 20 mg twice daily 6 days ago and SSRI/Lexapro was discontinued the day before yesterday however sodium level not improving.  Nephrologist was consulted yesterday, and that evaluation yesterday showed the patient was in hypovolemic hyponatremia with Na 123, patient was given 1 day of isotonic normal saline, today's sodium level 120>119.  Patient also complained about feeling tired all the time, unable to engage in physical therapy because of the weakness.  Nephrologist recommended 3% hypertonic saline to correct the symptomatic hyponatremia, for which patient will be admitted to ICU for close monitoring including mentation and other vital signs. As per rehab physician, patient was on a chronic Foley, which was removed yesterday however patient soon developed significant urinary retention the same day and a new Foley was placed in.  Patient complaining about feeling generalized weakness, denied any  significant pain, no fever or chills no cough no vision changes no feeling of lightheaded or dizziness.  Review of Systems: As per HPI otherwise 14 point review of systems negative.    Past Medical History:  Diagnosis Date  . Anxiety   . Arthritis    "generalized" (04/04/2017)  . Asthma   . Basal cell carcinoma    "left ear"  . BPH (benign prostatic hyperplasia)    severe; s/p multiple biopsies  . CAD (coronary artery disease)   . Carotid artery occlusion   . Chronic respiratory failure ([place])   . Chronic rhinitis   . COPD (chronic obstructive pulmonary disease) (HCC)    2L [place] O2  . Diastolic heart failure ([place]) 2019  . Dilation of biliary tract   . Elevated troponin 03/09/2017  . Gallstones   . GERD (gastroesophageal reflux disease)   . History of blood transfusion    "w/his CABG" (04/04/2017)  . History of kidney stones   . Hyperlipidemia   . Hypertension   . On home oxygen therapy    "~ 24/7" (04/04/2017)  . Peripheral vascular disease ([place])   . Pneumonia 2019  . Squamous cell carcinoma of skin 04/23/2013   in situ-Right flank (txpbx)  . Squamous cell carcinoma of skin 01/23/2017   [place]/ in siut- Left ear rim (CX3FU)  . Syncope and collapse   . Ureteral tumor 08/2015   had endoscopic procedure for evaluation, unable to reach for biopsy  . Urinary catheter (Foley) change required     Past Surgical History:  Procedure Laterality Date  . ABDOMINAL AORTOGRAM W/LOWER EXTREMITY Bilateral 06/30/2019   Procedure: ABDOMINAL AORTOGRAM W/LOWER EXTREMITY;  Surgeon: Waynetta Sandy, [name];  Location: [place] [facility];  Service: Cardiovascular;  Laterality: Bilateral;  . AMPUTATION Right 08/11/2019   Procedure: RIGHT ABOVE KNEE AMPUTATION;  Surgeon: Angelia Mould, [name];  Location: [place];  Service: Vascular;  Laterality: Right;  . BASAL CELL CARCINOMA EXCISION Left    ear  . CARDIAC CATHETERIZATION     "prior to bypass"  . CATARACT EXTRACTION W/ INTRAOCULAR LENS   IMPLANT, BILATERAL Bilateral   . CORONARY ARTERY BYPASS GRAFT  10/2001   LIMA to LAD, SVG to OM1-2, SVG to RCA and PDA  . LITHOTRIPSY    . PROSTATE BIOPSY  Oct. 2014  . TONSILLECTOMY       reports that he quit smoking about 37 years ago. His smoking use included cigarettes. He has a 66.00 pack-year smoking history. He has never used smokeless tobacco. He reports current alcohol use. He reports that he does not use drugs.  Allergies  Allergen Reactions  . Cefdinir Diarrhea and Other (See Comments)    Severe Diarrhea  . Nitrofurantoin Swelling and Other (See Comments)    Hand Swelling  . Sulfa Antibiotics Anaphylaxis and Swelling  . Sulfonamide Derivatives Swelling and Other (See Comments)    Facial/tongue swelling  . Tape Other (See Comments)    SKIN IS VERY THIN AND TEARS EASILY!!!!! Please do NOT use "plastic" tape- USE PAPER!!  . Ciprofloxacin Itching, Rash and Other (See Comments)    Red itchy hands  . Nitrofurantoin Swelling and Other (See Comments)    Swollen hands  . Amoxicillin Er Other (See Comments)    Frequest urination  . Doxycycline Other (See Comments)    "Felt terrible"  . Levaquin [Levofloxacin] Itching and Rash  . Sertraline Anxiety and Other (See Comments)    Makes the patient jittery    Family History  Problem Relation Age of Onset  . Heart disease Mother   . Heart disease Father        Before age 28  . Breast cancer Mother   . Cancer Mother        Breast cancer  . Hypertension Mother   . Other Mother        AAA  and   Amputation  . Heart attack Father   . Stroke Brother        x3, still living   . Peripheral vascular disease Brother   . Heart disease Brother   . Hyperlipidemia Brother   . Hypertension Brother   . Heart disease Brother   . Allergies Brother      Prior to Admission medications   Medication Sig Start Date End Date Taking? Authorizing Provider  acetaminophen (TYLENOL) 500 MG tablet Take 1,000 mg by mouth every 6 (six) hours as  needed for moderate pain.     Provider, Historical, [name]  ALPRAZolam (XANAX) 0.25 MG tablet TAKE 1 TABLET BY MOUTH THREE TIMES DAILY AS NEEDED Patient taking differently: Take 0.125-0.25 mg by mouth 3 (three) times daily as needed for anxiety.  04/21/19   Tanda Rockers, [name]  aspirin 81 MG tablet Take 81 mg by mouth daily.    Provider, Historical, [name]  atorvastatin (LIPITOR) 20 MG tablet TAKE 1 TABLET BY MOUTH EVERY DAY Patient taking differently: Take 20 mg by mouth at bedtime.  05/24/16   Belva Crome, [name]  Cholecalciferol (VITAMIN D3) 2000 units TABS Take 2,000 Units by mouth daily with lunch.     Provider, Historical, [name]  famotidine (PEPCID) 20  MG tablet Take 20 mg by mouth at bedtime.     Provider, Historical, [name]  finasteride (PROSCAR) 5 MG tablet Take 5 mg by mouth daily with lunch.     Provider, Historical, [name]  fluticasone (FLONASE) 50 MCG/ACT nasal spray Place 2 sprays into both nostrils daily.    Provider, Historical, [name]  furosemide (LASIX) 20 MG tablet Take 1 tablet (20 mg total) by mouth 2 (two) times daily. 06/25/19   Burtis Junes, NP  levalbuterol [facility] HFA) 45 MCG/ACT inhaler Inhale 2 puffs into the lungs every 4 (four) hours as needed for wheezing or shortness of breath. 04/14/19   Tanda Rockers, [name]  levalbuterol (XOPENEX) 0.63 MG/3ML nebulizer solution USE 1 VIAL VIA NEBULIZER EVERY 4 HOURS AS NEEDED FOR WHEEZING OR SHORTNESS OF BREATH Patient taking differently: Take 0.63 mg by nebulization every 4 (four) hours as needed for wheezing or shortness of breath.  05/10/18   Tanda Rockers, [name]  metoprolol succinate (TOPROL-XL) 25 MG 24 hr tablet Take 0.5 tablets (12.5 mg total) by mouth daily. 07/11/19   Belva Crome, [name]  mometasone (NASONEX) 50 MCG/ACT nasal spray Place 2 sprays into the nose daily. 04/22/19   Tanda Rockers, [name]  morphine (MSIR) 15 MG tablet Take 15 mg by mouth every 4 (four) hours as needed for severe pain.    Provider, Historical, [name]  Multiple Vitamins-Minerals  (PRESERVISION AREDS 2) CAPS Take 1 capsule by mouth daily.    Provider, Historical, [name]  NITROSTAT 0.4 MG SL tablet Place 0.4 mg under the tongue every 5 (five) minutes as needed for chest pain.    Provider, Historical, [name]  OXYGEN Inhale 2 L/min into the lungs continuous.     Provider, Historical, [name]  predniSONE (DELTASONE) 10 MG tablet FOR FLARE OF COUGH/WHEEZING MAY INCREASE TO 2 DAILY BETTER, THEN 1 DAILY Patient taking differently: Take 10-20 mg by mouth in the morning. Take 10 mg by mouth in the morning and may increase to 20 mg as needed for coughing/wheezing (then decrease back to 10 mg once daily) 02/25/19   Tanda Rockers, [name]  PRESCRIPTION MEDICATION See admin instructions. [place] Trilogy ventilator- At bedtime    Provider, Historical, [name]  silodosin (RAPAFLO) 4 MG CAPS capsule Take 4 mg by mouth daily. 06/17/19   Provider, Historical, [name]  sodium chloride (MURO 128) 5 % ophthalmic solution Place 1 drop into both eyes 4 (four) times daily.    Provider, Historical, [name]  spironolactone (ALDACTONE) 25 MG tablet Take 12.5-25 mg by mouth See admin instructions. 12.5 mg on day and 25 mg the next day, pt alternates dosages 07/15/19   Provider, Historical, [name]  SYMBICORT 160-4.5 MCG/ACT inhaler Inhale 2 puffs into the lungs 2 (two) times daily. 06/06/19   Tanda Rockers, [name]  Tiotropium Bromide Monohydrate (SPIRIVA RESPIMAT) 2.5 MCG/ACT AERS INHALE 2 PUFFS BY MOUTH EVERY DAY Patient taking differently: Inhale 2 puffs into the lungs daily.  05/14/19   Tanda Rockers, [name]  vitamin B-12 (CYANOCOBALAMIN) 1000 MCG tablet Take 1,000 mcg by mouth daily.     Provider, Historical, [name]    Physical Exam: Vitals:   08/19/19 2345 08/20/19 0558 08/20/19 0838 08/20/19 1548  BP:  134/76 131/73 (!) 111/56  Pulse: 87 86 79 87  Resp: 18   17  Temp:      TempSrc:      SpO2: 97% 100% 100% 96%  Weight:  56.3 kg    Height:  Constitutional: NAD, calm, comfortable Vitals:   08/19/19 2345 08/20/19 0558 08/20/19 0838  08/20/19 1548  BP:  134/76 131/73 (!) 111/56  Pulse: 87 86 79 87  Resp: 18   17  Temp:      TempSrc:      SpO2: 97% 100% 100% 96%  Weight:  56.3 kg    Height:       Eyes: PERRL, lids and conjunctivae normal ENMT: Mucous membranes are moist. Posterior pharynx clear of any exudate or lesions.Normal dentition.  Neck: normal, supple, no masses, no thyromegaly Respiratory: clear to auscultation bilaterally, no wheezing, no crackles. Normal respiratory effort. No accessory muscle use.  Cardiovascular: Regular rate and rhythm, no murmurs / rubs / gallops. No extremity edema. 2+ pedal pulses. No carotid bruits.  Abdomen: no tenderness, no masses palpated. No hepatosplenomegaly. Bowel sounds positive.  Musculoskeletal: Bilateral AKA Skin: no rashes, lesions, ulcers. No induration Neurologic: CN 2-12 grossly intact. Sensation intact, DTR normal. Strength 5/5 in all 4.  Psychiatric: Normal judgment and insight. Alert and oriented x 3.  Lethargic   Labs on Admission: I have personally reviewed following labs and imaging studies  CBC: Recent Labs  Lab 08/14/19 0047 08/15/19 0554 08/18/19 0438  WBC 11.2* 8.6 8.5  NEUTROABS 9.2* 6.9  --   HGB 9.1* 8.8* 9.0*  HCT 28.3* 27.2* 27.2*  MCV 96.6 96.5 95.1  PLT 257 279 185   Basic Metabolic Panel: Recent Labs  Lab 08/19/19 0648 08/19/19 1927 08/20/19 0624 08/20/19 1012 08/20/19 1558  NA 123* 119* 120* 120*  120* 119*  K 4.2 4.7 4.2 4.8 4.3  CL 83* 82* 84* 84* 84*  CO2 33* 30 27 27 28   GLUCOSE 91 110* 89 124* 161*  BUN 17 18 16 18 16   CREATININE 0.87 0.81 0.78 0.79 0.81  CALCIUM 9.0 8.5* 8.5* 8.6* 8.4*   GFR: Estimated Creatinine Clearance: 50.2 mL/min (by C-G formula based on SCr of 0.81 mg/dL). Liver Function Tests: Recent Labs  Lab 08/15/19 0554 08/20/19 1012  AST 48*  --   ALT 48*  --   ALKPHOS 85  --   BILITOT 0.7  --   PROT 4.9*  --   ALBUMIN 2.4* 2.9*   No results for input(s): LIPASE, AMYLASE in the last 168  hours. No results for input(s): AMMONIA in the last 168 hours. Coagulation Profile: No results for input(s): INR, PROTIME in the last 168 hours. Cardiac Enzymes: No results for input(s): CKTOTAL, CKMB, CKMBINDEX, TROPONINI in the last 168 hours. BNP (last 3 results) No results for input(s): PROBNP in the last 8760 hours. HbA1C: No results for input(s): HGBA1C in the last 72 hours. CBG: No results for input(s): GLUCAP in the last 168 hours. Lipid Profile: No results for input(s): CHOL, HDL, LDLCALC, TRIG, CHOLHDL, LDLDIRECT in the last 72 hours. Thyroid Function Tests: Recent Labs    08/19/19 1420  TSH 12.163*   Anemia Panel: No results for input(s): VITAMINB12, FOLATE, FERRITIN, TIBC, IRON, RETICCTPCT in the last 72 hours. Urine analysis:    Component Value Date/Time   COLORURINE YELLOW 08/19/2019 1259   APPEARANCEUR CLOUDY (A) 08/19/2019 1259   LABSPEC 1.014 08/19/2019 1259   PHURINE 6.0 08/19/2019 1259   GLUCOSEU NEGATIVE 08/19/2019 1259   HGBUR MODERATE (A) 08/19/2019 1259   BILIRUBINUR NEGATIVE 08/19/2019 1259   KETONESUR NEGATIVE 08/19/2019 1259   PROTEINUR NEGATIVE 08/19/2019 1259   NITRITE NEGATIVE 08/19/2019 1259   LEUKOCYTESUR LARGE (A) 08/19/2019 1259    Radiological Exams on  Admission: DG Abd 1 View  Result Date: 08/19/2019 CLINICAL DATA:  Abdominal pain EXAM: ABDOMEN - 1 VIEW COMPARISON:  12/03/2017 FINDINGS: Nonobstructive bowel gas pattern. Moderate-sized rectal stool bal. 8 mm calcification projects over the right renal shadow. No other significant radiographic abnormality are seen. Aortoiliac atherosclerosis. IMPRESSION: 1. Nonobstructive bowel gas pattern. 2. Moderate-sized rectal stool burden. 3. Right-sided nephrolithiasis. Electronically Signed   By: Davina Poke D.O.   On: 08/19/2019 12:40    EKG: Independently reviewed.  Sinus rhythm, right axis deviation  Assessment/Plan Principal Problem:   Hyponatremia Active Problems:   BPH (benign  prostatic hyperplasia)   S/P AKA (above knee amputation) (HCC)   Hypoalbuminemia due to protein-calorie malnutrition (HCC)   Transaminitis   Acute blood loss anemia   Postoperative pain   Supplemental oxygen dependent   SOB (shortness of breath)   Chronic systolic CHF (congestive heart failure) ([place])   Urinary retention due to benign prostatic hyperplasia  (please populate well all problems here in Problem List. (For example, if patient is on BP meds at home and you resume or decide to hold them, it is a problem that needs to be her. Same for CAD, COPD, HLD and so on)  Hyponatremia, euvolemic, hypoosmolar -TSH>10, T3 and T4 pending, may need initiate Synthroid -Urine and serum osmolality and urine sodium studies implying mild SIADH, SSRI stopped -3% hypertonic saline at 40 cc/h as per nephrologist, admit to ICU for close monitoring and sodium level every 4 hours, frequent neuro checks during hypertonic saline infusion. -Fluid restriction 1800 daily -Hold Lasix  Chronic hypoxic and hypercapnic respiratory failure secondary to COPD -Oxygen and Trilogy -Continue tapering of steroid and finishing antibiotics  Acute on chronic urinary retention secondary to BPH -Foley was exchanged yesterday -UA yesterday showed no signs of UTI  Paroxysmal A. Fib -Continue low-dose beta-blocker and aspirin  CHF chronic diastolic -Euvolemic, blood pressure fairly controlled  PAD status post right AKA -Resume PT when sodium level improved  HLD -On statin  DVT prophylaxis: Lovenox Code Status: Partial code Family Communication: Wife at bedside Disposition Plan: Likely will need more than 2 midnight hospital stay to stabilize sodium level with hypertonic saline solution Consults called: Nephrology and critical care Admission status: ICU for 3% saline infusion   Lequita Halt [name] [facility] Pager   08/20/2019, 5:51 PM

## 2019-08-20 NOTE — Progress Notes (Signed)
Discussed with on-call nephrologist Dr. Candiss Norse, recommend not starting 3% saline given the already improvement of sodium level to 122 this evening.  Will check 1 more sodium reading tonight and then tomorrow, defer to nephrologist to start 3% saline.  Patient can be downgraded to telemetry for now.

## 2019-08-20 NOTE — Discharge Summary (Signed)
Physician Discharge Summary  Patient ID: [name] MRN: [id number] DOB/AGE: December 03, 1929 86 y.o.  Admit date: 08/14/2019 Discharge date: 08/20/2019  Discharge Diagnoses:  Principal Problem:   Hyponatremia Active Problems:   BPH (benign prostatic hyperplasia)   S/P AKA (above knee amputation) (HCC)   Hypoalbuminemia due to protein-calorie malnutrition (HCC)   Transaminitis   Acute blood loss anemia   Postoperative pain   Supplemental oxygen dependent   SOB (shortness of breath)   Chronic systolic CHF (congestive heart failure) ([place])   Urinary retention due to benign prostatic hyperplasia   Discharged Condition:  Stable   Significant Diagnostic Studies: DG Chest 2 View  Result Date: 08/18/2019 CLINICAL DATA:  Shortness of breath. EXAM: CHEST - 2 VIEW COMPARISON:  PA and lateral chest 06/27/2019. FINDINGS: The patient has new small bilateral pleural effusions. The lungs are markedly emphysematous but clear. Heart size is normal. Aortic atherosclerosis is noted. The patient is status post CABG. No acute or focal bony abnormality. IMPRESSION: Small bilateral pleural effusions.  The lungs are clear. Aortic Atherosclerosis (ICD10-I70.0) and Emphysema (ICD10-J43.9). Electronically Signed   By: Inge Rise M.D.   On: 08/18/2019 11:58   DG Abd 1 View  Result Date: 08/19/2019 CLINICAL DATA:  Abdominal pain EXAM: ABDOMEN - 1 VIEW COMPARISON:  12/03/2017 FINDINGS: Nonobstructive bowel gas pattern. Moderate-sized rectal stool bal. 8 mm calcification projects over the right renal shadow. No other significant radiographic abnormality are seen. Aortoiliac atherosclerosis. IMPRESSION: 1. Nonobstructive bowel gas pattern. 2. Moderate-sized rectal stool burden. 3. Right-sided nephrolithiasis. Electronically Signed   By: Davina Poke D.O.   On: 08/19/2019 12:40    Labs:  Basic Metabolic Panel: Recent Labs  Lab 08/14/19 0047 08/15/19 0554 08/18/19 0438 08/19/19 0648 08/19/19 1927  08/20/19 0624 08/20/19 1012 08/20/19 1558  NA 130* 129* 125* 123* 119* 120* 120*  120* 119*  K 4.2 4.1 4.0 4.2 4.7 4.2 4.8 4.3  CL 93* 90* 85* 83* 82* 84* 84* 84*  CO2 30 30 31  33* 30 27 27 28   GLUCOSE 92 93 94 91 110* 89 124* 161*  BUN 18 18 19 17 18 16 18 16   CREATININE 0.89 0.99 1.08 0.87 0.81 0.78 0.79 0.81  CALCIUM 8.5* 8.6* 8.6* 9.0 8.5* 8.5* 8.6* 8.4*    CBC: Recent Labs  Lab 08/14/19 0047 08/15/19 0554 08/18/19 0438  WBC 11.2* 8.6 8.5  NEUTROABS 9.2* 6.9  --   HGB 9.1* 8.8* 9.0*  HCT 28.3* 27.2* 27.2*  MCV 96.6 96.5 95.1  PLT 257 279 322    CBG: No results for input(s): GLUCAP in the last 168 hours.  Brief HPI:   [name] is a 86 y.o. male with history of COPD-chronic hypoxic respiratory failure and 2 L oxygen dependent, BPH--indwelling Foley for retention, PAF, CAD s/p CABG, PVD with chronic nonhealing wound on right foot without options for revascularization.  He was admitted on 08/11/2019 for right-AKA by [name].  Chronic diastolic heart failure was being monitored with 1500 cc FR to avoid overload.  He continued to have issues with weakness, pain control as well as orthostatic symptoms with attempts at sitting at edge of bed.  Therapy was ongoing and CIR was recommended due to functional decline.   Hospital Course: [name] was admitted to rehab 08/14/2019 for inpatient therapies to consist of PT and OT at least three hours five days a week. Past admission physiatrist, therapy team and rehab RN have worked together to provide customized collaborative inpatient  rehab.  At admission, patient required mod assist with mobility and with basic ADL tasks. R-BKA site was intact with staples in place and had min serosanguinous drainage from incision. No signs of infection noted. PRAFO ordered for pressure relief measures due to left heel pain.  Phantom pain has been managed with Tylenol qid as well as education on desensitization techniques. His blood pressures were  monitored on TID basis and have been relatively stable. Zithromax was resumed over the weekend as wife reported that patient was on chronic antibiotics for treatment of chronic sinusitis.   He did report difficulty breathing with SOB and Dr. Halford Chessman was consulted for input on medication adjustment. He recommended nebulized Pulmicort, Garlon Hatchet and Yupelri in place of Symbicort and Spiriva for now.  He was showing participation in therapy initially but over the past 24 to 48 hours, he has had increase in fatigue with lethargy as well as confusion noted by family. He was also found to have progressive hyponatremia that despite discontinuation of SSRI as well as holding couple doses of Lasix.  Dr. Augustin Coupe was consulted for assistant and recommended a trial of IV fluids as question hypovolemia with contraction alkalosis as cause of hyponatremia.  He did not respond to dialysis and continue to have drop in his sodium levels  His Foley was discontinued due to concerns of UTI and urine culture done was negative.  He has required I/O catheterizations due to ongoing retention however due to discomfort as well as difficulty with catheterization, foley was replaced today.  Hypertonic saline was discussed but patient wanted to continue attempts at rehab. A dose of tolvaptan 15 mg was trialed without improvement. Dr. Augustin Coupe recommended starting patient on 3% hypertonic saline at 40 cc/hr with BMET every 6 hours for monitoring. Dr. Harriett Rush Hospitalist was consulted for assistance and graciously agreed to transfer/manage patient in ICU setting.    Current medications:  . (feeding supplement) PROSource Plus  30 mL Oral BID BM  . acetaminophen  650 mg Oral TID WC & HS  . arformoterol  15 mcg Nebulization BID  . aspirin EC  81 mg Oral Daily  . atorvastatin  20 mg Oral QHS  . azithromycin  250 mg Oral Daily  . budesonide (PULMICORT) nebulizer solution  0.5 mg Nebulization BID  . Chlorhexidine Gluconate Cloth  6 each Topical  BID  . cycloSPORINE  1 drop Both Eyes BID  . enoxaparin (LOVENOX) injection  40 mg Subcutaneous QHS  . famotidine  20 mg Oral QHS  . feeding supplement (ENSURE ENLIVE)  237 mL Oral BID BM  . finasteride  5 mg Oral Q lunch  . fluticasone  2 spray Each Nare Daily  . furosemide  20 mg Oral BID  . lidocaine  1 patch Transdermal Q24H  . metoprolol succinate  12.5 mg Oral Daily  . multivitamin with minerals  1 tablet Oral Daily  . predniSONE  10 mg Oral q AM  . revefenacin  175 mcg Nebulization Daily  . senna  2 tablet Oral Daily  . sodium chloride  1 drop Both Eyes QID  . spironolactone  12.5 mg Oral QODAY  . spironolactone  25 mg Oral QODAY    Disposition: Acute hospital  Diet: Heart healthy  Special Instructions: 1. Cleanse R-BKA with normal saline, pat dry, cover with kerlix and then stump shrinker. 2. Needs to wear PRAFO on left foot when in bed.      Signed: Bary Leriche 08/20/2019, 5:26 PM

## 2019-08-20 NOTE — Progress Notes (Signed)
Patient ID: [name], male   DOB: Jun 04, 1929, 86 y.o.   MRN: [id number]  Team Conference Report to Patient/Family  Team Conference discussion was reviewed with the patient and caregiver, including goals, any changes in plan of care and target discharge date.  Patient and caregiver express understanding and are in agreement.  The patient has a target discharge date of 09/02/19.  Dyanne Iha 08/20/2019, 3:19 PM

## 2019-08-21 ENCOUNTER — Inpatient Hospital Stay (HOSPITAL_REHABILITATION)
Admission: RE | Admit: 2019-08-21 | Discharge: 2019-09-01 | Disposition: A | Discharge status: Skilled Nursing Facility | Payer: Medicare Other | Source: Intra-hospital | Attending: Physical Medicine & Rehabilitation | Admitting: Physical Medicine & Rehabilitation

## 2019-08-21 ENCOUNTER — Inpatient Hospital Stay (HOSPITAL_COMMUNITY): Payer: Medicare Other | Admitting: Occupational Therapy

## 2019-08-21 ENCOUNTER — Inpatient Hospital Stay (HOSPITAL_COMMUNITY): Payer: Medicare Other

## 2019-08-21 DIAGNOSIS — Z89611 Acquired absence of right leg above knee: Secondary | ICD-10-CM

## 2019-08-21 DIAGNOSIS — F419 Anxiety disorder, unspecified: Secondary | ICD-10-CM | POA: Diagnosis present

## 2019-08-21 DIAGNOSIS — N183 Chronic kidney disease, stage 3 unspecified: Secondary | ICD-10-CM | POA: Diagnosis present

## 2019-08-21 DIAGNOSIS — E871 Hypo-osmolality and hyponatremia: Secondary | ICD-10-CM

## 2019-08-21 DIAGNOSIS — R0989 Other specified symptoms and signs involving the circulatory and respiratory systems: Secondary | ICD-10-CM

## 2019-08-21 DIAGNOSIS — Z4781 Encounter for orthopedic aftercare following surgical amputation: Secondary | ICD-10-CM

## 2019-08-21 DIAGNOSIS — E039 Hypothyroidism, unspecified: Secondary | ICD-10-CM

## 2019-08-21 DIAGNOSIS — D72829 Elevated white blood cell count, unspecified: Secondary | ICD-10-CM

## 2019-08-21 DIAGNOSIS — M79672 Pain in left foot: Secondary | ICD-10-CM

## 2019-08-21 DIAGNOSIS — N400 Enlarged prostate without lower urinary tract symptoms: Secondary | ICD-10-CM | POA: Diagnosis present

## 2019-08-21 DIAGNOSIS — I480 Paroxysmal atrial fibrillation: Secondary | ICD-10-CM | POA: Diagnosis present

## 2019-08-21 DIAGNOSIS — G44209 Tension-type headache, unspecified, not intractable: Secondary | ICD-10-CM | POA: Diagnosis present

## 2019-08-21 DIAGNOSIS — L89159 Pressure ulcer of sacral region, unspecified stage: Secondary | ICD-10-CM

## 2019-08-21 DIAGNOSIS — I130 Hypertensive heart and chronic kidney disease with heart failure and stage 1 through stage 4 chronic kidney disease, or unspecified chronic kidney disease: Secondary | ICD-10-CM | POA: Diagnosis present

## 2019-08-21 DIAGNOSIS — J9611 Chronic respiratory failure with hypoxia: Secondary | ICD-10-CM | POA: Diagnosis present

## 2019-08-21 DIAGNOSIS — Z89619 Acquired absence of unspecified leg above knee: Secondary | ICD-10-CM

## 2019-08-21 DIAGNOSIS — J329 Chronic sinusitis, unspecified: Secondary | ICD-10-CM | POA: Diagnosis present

## 2019-08-21 DIAGNOSIS — I5022 Chronic systolic (congestive) heart failure: Secondary | ICD-10-CM

## 2019-08-21 DIAGNOSIS — R410 Disorientation, unspecified: Secondary | ICD-10-CM | POA: Diagnosis present

## 2019-08-21 DIAGNOSIS — R339 Retention of urine, unspecified: Secondary | ICD-10-CM | POA: Diagnosis not present

## 2019-08-21 DIAGNOSIS — G8918 Other acute postprocedural pain: Secondary | ICD-10-CM

## 2019-08-21 DIAGNOSIS — J449 Chronic obstructive pulmonary disease, unspecified: Secondary | ICD-10-CM | POA: Diagnosis present

## 2019-08-21 DIAGNOSIS — M25551 Pain in right hip: Secondary | ICD-10-CM

## 2019-08-21 DIAGNOSIS — F411 Generalized anxiety disorder: Secondary | ICD-10-CM

## 2019-08-21 DIAGNOSIS — E8809 Other disorders of plasma-protein metabolism, not elsewhere classified: Secondary | ICD-10-CM | POA: Diagnosis present

## 2019-08-21 DIAGNOSIS — N200 Calculus of kidney: Secondary | ICD-10-CM | POA: Diagnosis present

## 2019-08-21 DIAGNOSIS — I110 Hypertensive heart disease with heart failure: Secondary | ICD-10-CM | POA: Diagnosis not present

## 2019-08-21 DIAGNOSIS — J9612 Chronic respiratory failure with hypercapnia: Secondary | ICD-10-CM | POA: Diagnosis not present

## 2019-08-21 DIAGNOSIS — I5032 Chronic diastolic (congestive) heart failure: Secondary | ICD-10-CM | POA: Diagnosis present

## 2019-08-21 LAB — BASIC METABOLIC PANEL
Anion gap: 7 (ref 5–15)
Anion gap: 7 (ref 5–15)
Anion gap: 8 (ref 5–15)
Anion gap: 8 (ref 5–15)
BUN: 14 mg/dL (ref 8–23)
BUN: 14 mg/dL (ref 8–23)
BUN: 15 mg/dL (ref 8–23)
BUN: 16 mg/dL (ref 8–23)
CO2: 30 mmol/L (ref 22–32)
CO2: 30 mmol/L (ref 22–32)
CO2: 30 mmol/L (ref 22–32)
CO2: 31 mmol/L (ref 22–32)
Calcium: 8.6 mg/dL — ABNORMAL LOW (ref 8.9–10.3)
Calcium: 8.7 mg/dL — ABNORMAL LOW (ref 8.9–10.3)
Calcium: 8.7 mg/dL — ABNORMAL LOW (ref 8.9–10.3)
Calcium: 8.8 mg/dL — ABNORMAL LOW (ref 8.9–10.3)
Chloride: 87 mmol/L — ABNORMAL LOW (ref 98–111)
Chloride: 87 mmol/L — ABNORMAL LOW (ref 98–111)
Chloride: 88 mmol/L — ABNORMAL LOW (ref 98–111)
Chloride: 89 mmol/L — ABNORMAL LOW (ref 98–111)
Creatinine, Ser: 0.71 mg/dL (ref 0.61–1.24)
Creatinine, Ser: 0.81 mg/dL (ref 0.61–1.24)
Creatinine, Ser: 0.84 mg/dL (ref 0.61–1.24)
Creatinine, Ser: 0.95 mg/dL (ref 0.61–1.24)
GFR calc Af Amer: >60 mL/min (ref >60)
GFR calc Af Amer: >60 mL/min (ref >60)
GFR calc Af Amer: >60 mL/min (ref >60)
GFR calc Af Amer: >60 mL/min (ref >60)
GFR calc non Af Amer: >60 mL/min (ref >60)
GFR calc non Af Amer: >60 mL/min (ref >60)
GFR calc non Af Amer: >60 mL/min (ref >60)
GFR calc non Af Amer: >60 mL/min (ref >60)
Glucose, Bld: 134 mg/dL — ABNORMAL HIGH (ref 70–99)
Glucose, Bld: 87 mg/dL (ref 70–99)
Glucose, Bld: 90 mg/dL (ref 70–99)
Glucose, Bld: 95 mg/dL (ref 70–99)
Potassium: 4.1 mmol/L (ref 3.5–5.1)
Potassium: 4.3 mmol/L (ref 3.5–5.1)
Potassium: 4.3 mmol/L (ref 3.5–5.1)
Potassium: 4.3 mmol/L (ref 3.5–5.1)
Sodium: 125 mmol/L — ABNORMAL LOW (ref 135–145)
Sodium: 125 mmol/L — ABNORMAL LOW (ref 135–145)
Sodium: 125 mmol/L — ABNORMAL LOW (ref 135–145)
Sodium: 127 mmol/L — ABNORMAL LOW (ref 135–145)

## 2019-08-21 LAB — CBC
HCT: 26 % — ABNORMAL LOW (ref 39.0–52.0)
Hemoglobin: 8.7 g/dL — ABNORMAL LOW (ref 13.0–17.0)
MCH: 32 pg (ref 26.0–34.0)
MCHC: 33.5 g/dL (ref 30.0–36.0)
MCV: 95.6 fL (ref 80.0–100.0)
Platelets: 399 10*3/uL (ref 150–400)
RBC: 2.72 MIL/uL — ABNORMAL LOW (ref 4.22–5.81)
RDW: 12.7 % (ref 11.5–15.5)
WBC: 9.3 10*3/uL (ref 4.0–10.5)
nRBC: 0 % (ref 0.0–0.2)

## 2019-08-21 LAB — GLUCOSE, CAPILLARY: Glucose-Capillary: 107 mg/dL — ABNORMAL HIGH (ref 70–99)

## 2019-08-21 LAB — SODIUM: Sodium: 125 mmol/L — ABNORMAL LOW (ref 135–145)

## 2019-08-21 LAB — T3, FREE: T3, Free: 1.3 pg/mL — ABNORMAL LOW (ref 2.0–4.4)

## 2019-08-21 MED ORDER — ASPIRIN EC 81 MG PO TBEC
81.0000 mg | DELAYED_RELEASE_TABLET | Freq: Every day | ORAL | Status: DC
  Administered 2019-08-21: 81 mg via ORAL
  Filled 2019-08-21: qty 1

## 2019-08-21 MED ORDER — PREDNISONE 5 MG PO TABS
10.0000 mg | ORAL_TABLET | Freq: Every day | ORAL | Status: DC
  Administered 2019-08-22 – 2019-09-01 (×11): 10 mg via ORAL
  Filled 2019-08-21 (×11): qty 2

## 2019-08-21 MED ORDER — SODIUM CHLORIDE 0.9% FLUSH: 3.0000 mL | INTRAVENOUS | Status: DC | PRN

## 2019-08-21 MED ORDER — TIOTROPIUM BROMIDE MONOHYDRATE 2.5 MCG/ACT IN AERS
2.0000 | INHALATION_SPRAY | Freq: Every day | RESPIRATORY_TRACT | Status: DC
  Administered 2019-08-26 – 2019-09-01 (×7): 2 via RESPIRATORY_TRACT

## 2019-08-21 MED ORDER — ATORVASTATIN CALCIUM 10 MG PO TABS: 20.0000 mg | ORAL_TABLET | Freq: Every day | ORAL | Status: DC

## 2019-08-21 MED ORDER — NITROGLYCERIN 0.4 MG SL SUBL: 0.4000 mg | SUBLINGUAL_TABLET | SUBLINGUAL | Status: DC | PRN

## 2019-08-21 MED ORDER — ASPIRIN EC 81 MG PO TBEC
81.0000 mg | DELAYED_RELEASE_TABLET | Freq: Every day | ORAL | Status: DC
  Administered 2019-08-22 – 2019-08-25 (×4): 81 mg via ORAL
  Filled 2019-08-21 (×4): qty 1

## 2019-08-21 MED ORDER — SODIUM CHLORIDE 0.9 % IV SOLN: 250.0000 mL | INTRAVENOUS | Status: DC | PRN

## 2019-08-21 MED ORDER — VITAMIN B-12 1000 MCG PO TABS
1000.0000 ug | ORAL_TABLET | Freq: Every day | ORAL | Status: DC
  Administered 2019-08-21 – 2019-08-25 (×5): 1000 ug via ORAL
  Filled 2019-08-21 (×5): qty 1

## 2019-08-21 MED ORDER — TOLVAPTAN 15 MG PO TABS
15.0000 mg | ORAL_TABLET | Freq: Once | ORAL | Status: AC
  Administered 2019-08-21: 15 mg via ORAL
  Filled 2019-08-21: qty 1

## 2019-08-21 MED ORDER — GUAIFENESIN-DM 100-10 MG/5ML PO SYRP: 5.0000 mL | ORAL_SOLUTION | Freq: Four times a day (QID) | ORAL | Status: DC | PRN

## 2019-08-21 MED ORDER — FINASTERIDE 5 MG PO TABS
5.0000 mg | ORAL_TABLET | Freq: Every day | ORAL | Status: DC
  Administered 2019-08-22 – 2019-08-31 (×10): 5 mg via ORAL
  Filled 2019-08-21 (×11): qty 1

## 2019-08-21 MED ORDER — PROCHLORPERAZINE 25 MG RE SUPP: 12.5000 mg | Freq: Four times a day (QID) | RECTAL | Status: DC | PRN

## 2019-08-21 MED ORDER — MOMETASONE FURO-FORMOTEROL FUM 200-5 MCG/ACT IN AERO: 2.0000 | INHALATION_SPRAY | Freq: Two times a day (BID) | RESPIRATORY_TRACT | Status: DC

## 2019-08-21 MED ORDER — SODIUM CHLORIDE 0.9% FLUSH
3.0000 mL | Freq: Two times a day (BID) | INTRAVENOUS | Status: DC
  Administered 2019-08-21: 3 mL via INTRAVENOUS

## 2019-08-21 MED ORDER — BISACODYL 10 MG RE SUPP: 10.0000 mg | Freq: Every day | RECTAL | Status: DC | PRN

## 2019-08-21 MED ORDER — METOPROLOL SUCCINATE ER 25 MG PO TB24
12.5000 mg | ORAL_TABLET | Freq: Every day | ORAL | Status: DC
  Administered 2019-08-22 – 2019-09-01 (×10): 12.5 mg via ORAL
  Filled 2019-08-21 (×11): qty 1

## 2019-08-21 MED ORDER — FLUTICASONE PROPIONATE 50 MCG/ACT NA SUSP
2.0000 | Freq: Every day | NASAL | Status: DC
  Administered 2019-08-21 – 2019-09-01 (×9): 2 via NASAL

## 2019-08-21 MED ORDER — LEVALBUTEROL HCL 0.63 MG/3ML IN NEBU
0.6300 mg | INHALATION_SOLUTION | RESPIRATORY_TRACT | Status: DC | PRN
  Administered 2019-08-21 (×2): 0.63 mg via RESPIRATORY_TRACT
  Filled 2019-08-21 (×2): qty 3

## 2019-08-21 MED ORDER — FAMOTIDINE 20 MG PO TABS
20.0000 mg | ORAL_TABLET | Freq: Every day | ORAL | Status: DC
  Administered 2019-08-21 – 2019-08-23 (×3): 20 mg via ORAL
  Filled 2019-08-21 (×3): qty 1

## 2019-08-21 MED ORDER — ALPRAZOLAM 0.25 MG PO TABS: 0.2500 mg | ORAL_TABLET | Freq: Two times a day (BID) | ORAL | Status: DC | PRN

## 2019-08-21 MED ORDER — LEVALBUTEROL HCL 0.63 MG/3ML IN NEBU
0.6300 mg | INHALATION_SOLUTION | RESPIRATORY_TRACT | Status: DC | PRN
  Administered 2019-08-21 – 2019-08-23 (×3): 0.63 mg via RESPIRATORY_TRACT

## 2019-08-21 MED ORDER — ENOXAPARIN SODIUM 40 MG/0.4ML ~~LOC~~ SOLN
40.0000 mg | SUBCUTANEOUS | Status: DC
  Administered 2019-08-21: 40 mg via SUBCUTANEOUS
  Filled 2019-08-21: qty 0.4

## 2019-08-21 MED ORDER — PRESERVISION AREDS 2 PO CAPS: 1.0000 | ORAL_CAPSULE | Freq: Every day | ORAL | Status: DC

## 2019-08-21 MED ORDER — FLUTICASONE PROPIONATE 50 MCG/ACT NA SUSP
1.0000 | Freq: Every day | NASAL | Status: DC
  Administered 2019-08-21: 1 via NASAL

## 2019-08-21 MED ORDER — DIPHENHYDRAMINE HCL 12.5 MG/5ML PO ELIX: 12.5000 mg | ORAL_SOLUTION | Freq: Four times a day (QID) | ORAL | Status: DC | PRN

## 2019-08-21 MED ORDER — METOPROLOL SUCCINATE ER 25 MG PO TB24
12.5000 mg | ORAL_TABLET | Freq: Every day | ORAL | Status: DC
  Administered 2019-08-21: 12.5 mg via ORAL
  Filled 2019-08-21: qty 1

## 2019-08-21 MED ORDER — ALPRAZOLAM 0.25 MG PO TABS
0.2500 mg | ORAL_TABLET | Freq: Once | ORAL | Status: AC | PRN
  Administered 2019-08-21: 0.25 mg via ORAL
  Filled 2019-08-21: qty 1

## 2019-08-21 MED ORDER — PROCHLORPERAZINE MALEATE 5 MG PO TABS: 5.0000 mg | ORAL_TABLET | Freq: Four times a day (QID) | ORAL | Status: DC | PRN

## 2019-08-21 MED ORDER — BUDESONIDE-FORMOTEROL FUMARATE 160-4.5 MCG/ACT IN AERO
2.0000 | INHALATION_SPRAY | Freq: Two times a day (BID) | RESPIRATORY_TRACT | Status: DC
  Administered 2019-08-21 – 2019-09-01 (×20): 2 via RESPIRATORY_TRACT

## 2019-08-21 MED ORDER — FLEET ENEMA 7-19 GM/118ML RE ENEM: 1.0000 | ENEMA | Freq: Once | RECTAL | Status: DC | PRN

## 2019-08-21 MED ORDER — ACETAMINOPHEN 650 MG RE SUPP: 650.0000 mg | Freq: Four times a day (QID) | RECTAL | Status: DC | PRN

## 2019-08-21 MED ORDER — ATORVASTATIN CALCIUM 10 MG PO TABS
20.0000 mg | ORAL_TABLET | Freq: Every day | ORAL | Status: DC
  Administered 2019-08-21 – 2019-08-31 (×11): 20 mg via ORAL
  Filled 2019-08-21 (×11): qty 2

## 2019-08-21 MED ORDER — NON FORMULARY: 160.0000 ug | Freq: Two times a day (BID) | Status: DC

## 2019-08-21 MED ORDER — ACETAMINOPHEN 325 MG PO TABS
650.0000 mg | ORAL_TABLET | Freq: Four times a day (QID) | ORAL | Status: DC | PRN
  Administered 2019-08-21 (×2): 650 mg via ORAL
  Filled 2019-08-21 (×2): qty 2

## 2019-08-21 MED ORDER — MOMETASONE FURO-FORMOTEROL FUM 200-5 MCG/ACT IN AERO
2.0000 | INHALATION_SPRAY | Freq: Two times a day (BID) | RESPIRATORY_TRACT | Status: DC
  Administered 2019-08-21: 2 via RESPIRATORY_TRACT
  Filled 2019-08-21 (×2): qty 8.8

## 2019-08-21 MED ORDER — ONDANSETRON HCL 4 MG/2ML IJ SOLN: 4.0000 mg | Freq: Four times a day (QID) | INTRAMUSCULAR | Status: DC | PRN

## 2019-08-21 MED ORDER — SODIUM CHLORIDE (HYPERTONIC) 5 % OP SOLN
1.0000 [drp] | Freq: Four times a day (QID) | OPHTHALMIC | Status: DC
  Administered 2019-08-21 – 2019-09-01 (×29): 1 [drp] via OPHTHALMIC

## 2019-08-21 MED ORDER — CHLORHEXIDINE GLUCONATE CLOTH 2 % EX PADS: 6.0000 | MEDICATED_PAD | Freq: Every day | CUTANEOUS | Status: DC

## 2019-08-21 MED ORDER — ONDANSETRON HCL 4 MG PO TABS: 4.0000 mg | ORAL_TABLET | Freq: Four times a day (QID) | ORAL | Status: DC | PRN

## 2019-08-21 MED ORDER — PROCHLORPERAZINE EDISYLATE 10 MG/2ML IJ SOLN: 5.0000 mg | Freq: Four times a day (QID) | INTRAMUSCULAR | Status: DC | PRN

## 2019-08-21 MED ORDER — ACETAMINOPHEN 325 MG PO TABS
325.0000 mg | ORAL_TABLET | ORAL | Status: DC | PRN
  Administered 2019-08-21 – 2019-09-01 (×33): 650 mg via ORAL
  Filled 2019-08-21 (×35): qty 2

## 2019-08-21 MED ORDER — POLYETHYL GLYCOL-PROPYL GLYCOL 0.4-0.3 % OP SOLN: 1.0000 [drp] | Freq: Two times a day (BID) | OPHTHALMIC | Status: DC | PRN

## 2019-08-21 MED ORDER — POLYETHYLENE GLYCOL 3350 17 G PO PACK
17.0000 g | PACK | Freq: Every day | ORAL | Status: DC | PRN
  Administered 2019-08-23: 17 g via ORAL
  Filled 2019-08-21 (×2): qty 1

## 2019-08-21 MED ORDER — UMECLIDINIUM BROMIDE 62.5 MCG/INH IN AEPB: 1.0000 | INHALATION_SPRAY | Freq: Every day | RESPIRATORY_TRACT | Status: DC

## 2019-08-21 MED ORDER — ALUM & MAG HYDROXIDE-SIMETH 200-200-20 MG/5ML PO SUSP
30.0000 mL | ORAL | Status: DC | PRN
  Administered 2019-08-23: 30 mL via ORAL
  Filled 2019-08-21 (×2): qty 30

## 2019-08-21 MED ORDER — ALPRAZOLAM 0.25 MG PO TABS
0.2500 mg | ORAL_TABLET | Freq: Two times a day (BID) | ORAL | Status: DC | PRN
  Administered 2019-08-21 – 2019-08-24 (×5): 0.25 mg via ORAL
  Filled 2019-08-21 (×5): qty 1

## 2019-08-21 MED ORDER — UMECLIDINIUM BROMIDE 62.5 MCG/INH IN AEPB
1.0000 | INHALATION_SPRAY | Freq: Every day | RESPIRATORY_TRACT | Status: DC
  Administered 2019-08-21: 1 via RESPIRATORY_TRACT
  Filled 2019-08-21 (×2): qty 7

## 2019-08-21 MED ORDER — ENOXAPARIN SODIUM 40 MG/0.4ML ~~LOC~~ SOLN
40.0000 mg | SUBCUTANEOUS | Status: DC
  Administered 2019-08-22 – 2019-09-01 (×11): 40 mg via SUBCUTANEOUS
  Filled 2019-08-21 (×12): qty 0.4

## 2019-08-21 MED ORDER — ADULT MULTIVITAMIN W/MINERALS CH
1.0000 | ORAL_TABLET | Freq: Every day | ORAL | Status: DC
  Administered 2019-08-22 – 2019-08-25 (×4): 1 via ORAL
  Filled 2019-08-21 (×4): qty 1

## 2019-08-21 NOTE — Plan of Care (Signed)

## 2019-08-21 NOTE — Progress Notes (Addendum)
Inpatient Rehabilitation Medication Review by a Pharmacist  A complete drug regimen review was completed for this patient to identify any potential clinically significant medication issues.  Clinically significant medication issues were identified:  yes   Type of Medication Issue Identified Description of Issue Urgent (address now) Non-Urgent (address on AM team rounds) Plan Plan Accepted by Provider? (Yes / No / Pending AM Rounds)  Drug Interaction(s) (clinically significant)       Duplicate Therapy  To use own Symbicort and Spiriva Respimat inhalers.  Dulera and Incruse (usual substitutions) discontinued.     Allergy       No Medication Administration End Date       Incorrect Dose       Additional Drug Therapy Needed  Lidocaine patch to L medial heel daily had been added on 8/9.  Was not continued on inpatient transfer 8/11. Non-urgent Follow up on 8/13   Other         For non-urgent medication issues to be resolved on team rounds tomorrow morning a [facility] Secure Chat Handoff was sent to:   Reesa Chew, PA-C  Pharmacist comments:    Was on CIR 8/5-8/11 > to inpatient 8/11 pm > back to CIR today   Staying off Lasix, Spironolactone and Lexapro.  Time spent performing this drug regimen review (minutes):  20   Arty Baumgartner, [place] 08/21/2019 5:20 PM

## 2019-08-21 NOTE — Progress Notes (Addendum)
Inpatient Rehab Admissions Coordinator:   I have approval from Dr. Naaman Plummer and Dr. Dwyane Dee for pt to readmit to CIR today for an interrupted stay.   Shann Medal, PT, DPT Admissions Coordinator [phone] 08/21/19  11:31 AM  Agree with the above. Sodium levels improved. Continue current management per nephrology. Patient to return to inpatient rehab as interrupted stay.  Meredith Staggers, MD, [facility] 08/21/2019

## 2019-08-21 NOTE — Plan of Care (Signed)
  Problem: Clinical Measurements: Goal: Ability to maintain clinical measurements within normal limits will improve Outcome: Progressing   Problem: Clinical Measurements: Goal: Diagnostic test results will improve Outcome: Progressing   Problem: Nutrition: Goal: Adequate nutrition will be maintained Outcome: Progressing   Problem: Coping: Goal: Level of anxiety will decrease Outcome: Progressing   Problem: Elimination: Goal: Will not experience complications related to urinary retention Outcome: Progressing   Problem: Pain Managment: Goal: General experience of comfort will improve Outcome: Progressing   Problem: Skin Integrity: Goal: Risk for impaired skin integrity will decrease Outcome: Progressing

## 2019-08-21 NOTE — Progress Notes (Signed)
CIR following for possible return to CIR.   Travis Fenner LCSW

## 2019-08-21 NOTE — Progress Notes (Signed)
Patient admitted from inpatient rehab yesterday evening with hyponatremia. Notified by RN that patient does not have any orders. Suspect the orders were entered in the patient's rehab chart and deleted once he was moved to the hospital. Patient is stable, sodium improving. Will review chart and enter basic orders.

## 2019-08-21 NOTE — Consult Note (Signed)
   [facility] Inpatient Consult   08/21/2019  [name] 02-04-1929 [id number]   [facility] [ACO] Patient:  Medicare NextGen  Follow up:  Active with [place] Coordinator, return to acute hospital   Review for inpatient transfer from CIR to back to  Acute inpatient for observation of hyponatremia.  Patient to transition back to CIR.  Plan: Follow progress periodically for transition from CIR.  Natividad Brood, RN BSN [facility] Liaison  [phone] business mobile phone Toll free office [phone]  Fax number: [phone] [email or web address]

## 2019-08-21 NOTE — Progress Notes (Signed)
Inpatient Rehabilitation  Patient resuming Inpatient Rehabilitation Program following an interruption in stay from 08/20/2019-08/21/2019. Patient information will continue to be reviewed and entered into eRehab system by [facility]. Loni Beckwith., CCC/SLP, PPS Coordinator.  Information including medical coding, functional ability, and quality indicators will be reviewed and updated through discharge.

## 2019-08-21 NOTE — Discharge Summary (Signed)
Physician Discharge Summary  [name] CBJ:[id number] DOB: July 28, 1929 DOA: 08/20/2019  PCP: [name], MD  Admit date: 08/20/2019   Discharge date: 08/21/2019  Admitted From:  CIR  Disposition:   CIR(inpatient rehab)  Recommendations for Outpatient Follow-up:  1. Follow up with PCP in 1-2 weeks 2. Please obtain BMP/CBC in one week 3. Continue current management as per nephrology in inpatient rehab.  Home Health: None.  Equipment/Devices: Oxygen  Discharge Condition: Stable CODE STATUS:Limited code. Diet recommendation: Heart Healthy  Brief Summary / : [name] is a 86 y.o. male with medical history significant of COPD and chronic hypoxic/hypercapnic respiratory failure on 2 liters oxygen and Trilogy, chronic urinary retention secondary to BPH, on Foley, PAF, CAD status post CABG, PVD with chronic nonhealing wound on the right foot, was in rehab unit after Rt AKA on 08/11/19 by vascular surgery.   In rehab unit, patient also has been treating for mild COPD exacerbation with p.o. steroids and antibiotics.  Patient baseline sodium level 136 on 08/02, and since then gradually sodium level has been decreasing with normal and stable kidney function.  Patient was started on Lasix 20 mg twice daily 6 days ago and SSRI/Lexapro was discontinued the day before yesterday however sodium level not improving.  Nephrologist was consulted yesterday, and that evaluation yesterday showed the patient was in hypovolemic hyponatremia with Na 123, patient was given 1 day of isotonic normal saline, today's sodium level 120>119.  Patient also complained about feeling tired all the time, unable to engage in physical therapy because of the weakness.Nephrologist recommended 3% hypertonic saline to correct the symptomatic hyponatremia, for which patient will be admitted to ICU for close monitoring including mentation and other vital signs. As per rehab physician, patient was on a chronic Foley, which  was removed yesterday however patient soon developed significant urinary retention the same day and a new Foley was placed in.  Hospital course: Patient was admitted from CIR with generalized weakness with progressively decreasing sodium to 119.  Nephrology was consulted,  advised 3% hypertonic saline and management in the ICU but patient sodium level has improved.  Patient has received 1 L of isotonic saline, patient sodium level improved to 125.  Patient weakness has slightly improved.  Other problems remains stable.  Nephrology is actively following this patient and will continue to follow in CIR . Patient is being discharged back to CIR and will continue current treatment. Dr. Naaman Plummer had accepted the patient.   He was managed for below problems.  Discharge Diagnoses:  Active Problems:   Hyponatremia  Hyponatremia, euvolemic, hypoosmolar -TSH>10, T3 and T4 pending, may need initiate Synthroid -Urine and serum osmolality and urine sodium studies implying mild SIADH, SSRI stopped -3% hypertonic saline at 40 cc/h as per nephrologist, admit to ICU for close monitoring and sodium level every 4 hours, frequent neuro checks during hypertonic saline infusion. -Fluid restriction 1800 daily -Hold Lasix  Chronic hypoxic and hypercapnic respiratory failure secondary to COPD -Oxygen and Trilogy -Continue tapering of steroid and finishing antibiotics  Acute on chronic urinary retention secondary to BPH -Foley was exchanged yesterday. -UA yesterday showed no signs of UTI.  Paroxysmal A. Fib -Continue low-dose beta-blocker and aspirin  CHF chronic diastolic -Euvolemic, blood pressure fairly controlled  PAD status post right AKA -Resume PT when sodium level improved  HLD -On statin   Discharge Instructions  Discharge Instructions    Call MD for:  extreme fatigue   Complete by: As directed  Call MD for:  persistant dizziness or light-headedness   Complete by: As directed     Call MD for:  persistant nausea and vomiting   Complete by: As directed    Call MD for:  temperature >100.4   Complete by: As directed    Diet - low sodium heart healthy   Complete by: As directed    Discharge instructions   Complete by: As directed    Patient is return back to CIR   Discharge wound care:   Complete by: As directed    Management as per CIR   Increase activity slowly   Complete by: As directed      Allergies as of 08/21/2019      Reactions   Cefdinir Diarrhea, Other (See Comments)   Severe Diarrhea   Nitrofurantoin Swelling, Other (See Comments)   Hand Swelling   Sulfa Antibiotics Anaphylaxis, Swelling   Sulfonamide Derivatives Swelling, Other (See Comments)   Facial/tongue swelling   Tape Other (See Comments)   SKIN IS VERY THIN AND TEARS EASILY!!!!! Please do NOT use "plastic" tape- USE PAPER!!   Ciprofloxacin Itching, Rash, Other (See Comments)   Red itchy hands   Nitrofurantoin Swelling, Other (See Comments)   Swollen hands   Amoxicillin Er Other (See Comments)   Frequest urination   Doxycycline Other (See Comments)   "Felt terrible"   Levaquin [levofloxacin] Itching, Rash   Sertraline Anxiety, Other (See Comments)   Makes the patient jittery      Medication List    STOP taking these medications   acetaminophen 500 MG tablet Commonly known as: TYLENOL   furosemide 20 MG tablet Commonly known as: LASIX   levalbuterol 0.63 MG/3ML nebulizer solution Commonly known as: XOPENEX   morphine 15 MG tablet Commonly known as: MSIR   PRESCRIPTION MEDICATION   sodium chloride 5 % ophthalmic solution Commonly known as: MURO 128   Systane 0.4-0.3 % Soln Generic drug: Polyethyl Glycol-Propyl Glycol     TAKE these medications   ALPRAZolam 0.25 MG tablet Commonly known as: XANAX TAKE 1 TABLET BY MOUTH THREE TIMES DAILY AS NEEDED What changed:   how much to take  how to take this  when to take this  reasons to take this  additional  instructions   aspirin 81 MG tablet Take 81 mg by mouth daily.   atorvastatin 20 MG tablet Commonly known as: LIPITOR TAKE 1 TABLET BY MOUTH EVERY DAY What changed: when to take this   finasteride 5 MG tablet Commonly known as: PROSCAR Take 5 mg by mouth daily with lunch.   fluticasone 50 MCG/ACT nasal spray Commonly known as: FLONASE Place 2 sprays into both nostrils daily.   levalbuterol 45 MCG/ACT inhaler Commonly known as: Xopenex HFA Inhale 2 puffs into the lungs every 4 (four) hours as needed for wheezing or shortness of breath.   metoprolol succinate 25 MG 24 hr tablet Commonly known as: TOPROL-XL Take 0.5 tablets (12.5 mg total) by mouth daily.   mometasone 50 MCG/ACT nasal spray Commonly known as: NASONEX Place 2 sprays into the nose daily. What changed:   when to take this  reasons to take this   Nitrostat 0.4 MG SL tablet Generic drug: nitroGLYCERIN Place 0.4 mg under the tongue every 5 (five) minutes as needed for chest pain.   OXYGEN Inhale 2 L/min into the lungs continuous.   Pepcid 20 MG tablet Generic drug: famotidine Take 20 mg by mouth at bedtime.   predniSONE 10 MG tablet  Commonly known as: DELTASONE FOR FLARE OF COUGH/WHEEZING MAY INCREASE TO 2 DAILY BETTER, THEN 1 DAILY What changed:   how much to take  how to take this  when to take this  additional instructions   PreserVision AREDS 2 Caps Take 1 capsule by mouth daily.   Spiriva Respimat 2.5 MCG/ACT Aers Generic drug: Tiotropium Bromide Monohydrate INHALE 2 PUFFS BY MOUTH EVERY DAY What changed:   how much to take  how to take this  when to take this  additional instructions   spironolactone 25 MG tablet Commonly known as: ALDACTONE Take 12.5-25 mg by mouth See admin instructions. 12.5 mg on day and 25 mg the next day, pt alternates dosages   Symbicort 160-4.5 MCG/ACT inhaler Generic drug: budesonide-formoterol Inhale 2 puffs into the lungs 2 (two) times daily.    vitamin B-12 1000 MCG tablet Commonly known as: CYANOCOBALAMIN Take 1,000 mcg by mouth daily.   Vitamin D3 50 MCG (2000 UT) Tabs Take 2,000 Units by mouth daily with lunch.            Discharge Care Instructions  (From admission, onward)         Start     Ordered   08/21/19 0000  Discharge wound care:       Comments: Management as per CIR   08/21/19 1139          Allergies  Allergen Reactions  . Cefdinir Diarrhea and Other (See Comments)    Severe Diarrhea  . Nitrofurantoin Swelling and Other (See Comments)    Hand Swelling  . Sulfa Antibiotics Anaphylaxis and Swelling  . Sulfonamide Derivatives Swelling and Other (See Comments)    Facial/tongue swelling  . Tape Other (See Comments)    SKIN IS VERY THIN AND TEARS EASILY!!!!! Please do NOT use "plastic" tape- USE PAPER!!  . Ciprofloxacin Itching, Rash and Other (See Comments)    Red itchy hands  . Nitrofurantoin Swelling and Other (See Comments)    Swollen hands  . Amoxicillin Er Other (See Comments)    Frequest urination  . Doxycycline Other (See Comments)    "Felt terrible"  . Levaquin [Levofloxacin] Itching and Rash  . Sertraline Anxiety and Other (See Comments)    Makes the patient jittery    Consultations:  Nephrology, PCCM   Procedures/Studies: DG Chest 2 View  Result Date: 08/18/2019 CLINICAL DATA:  Shortness of breath. EXAM: CHEST - 2 VIEW COMPARISON:  PA and lateral chest 06/27/2019. FINDINGS: The patient has new small bilateral pleural effusions. The lungs are markedly emphysematous but clear. Heart size is normal. Aortic atherosclerosis is noted. The patient is status post CABG. No acute or focal bony abnormality. IMPRESSION: Small bilateral pleural effusions.  The lungs are clear. Aortic Atherosclerosis (ICD10-I70.0) and Emphysema (ICD10-J43.9). Electronically Signed   By: Inge Rise M.D.   On: 08/18/2019 11:58   DG Abd 1 View  Result Date: 08/19/2019 CLINICAL DATA:  Abdominal pain EXAM:  ABDOMEN - 1 VIEW COMPARISON:  12/03/2017 FINDINGS: Nonobstructive bowel gas pattern. Moderate-sized rectal stool bal. 8 mm calcification projects over the right renal shadow. No other significant radiographic abnormality are seen. Aortoiliac atherosclerosis. IMPRESSION: 1. Nonobstructive bowel gas pattern. 2. Moderate-sized rectal stool burden. 3. Right-sided nephrolithiasis. Electronically Signed   By: Davina Poke D.O.   On: 08/19/2019 12:40       Subjective: Patient was seen and examined at bedside.  No overnight events.  Patient reports feeling better.  More alert, awake denies any pain.  Discharge  Exam: Vitals:   08/21/19 0957 08/21/19 1123  BP: 112/68   Pulse: 85   Resp:    Temp:  97.7 F (36.5 C)  SpO2:     Vitals:   08/21/19 0447 08/21/19 0742 08/21/19 0957 08/21/19 1123  BP:  126/74 112/68   Pulse:  88 85   Resp:  18    Temp:  98.1 F (36.7 C)  97.7 F (36.5 C)  TempSrc:  Oral  Oral  SpO2:  98%    Weight: 56.7 kg     Height:        General: Pt is alert, awake, not in acute distress Cardiovascular: RRR, S1/S2 +, no rubs, no gallops Respiratory: CTA bilaterally, no wheezing, no rhonchi Abdominal: Soft, NT, ND, bowel sounds + Extremities: Right above-knee amputation.    The results of significant diagnostics from this hospitalization (including imaging, microbiology, ancillary and laboratory) are listed below for reference.     Microbiology: Recent Results (from the past 240 hour(s))  MRSA PCR Screening     Status: None   Collection Time: 08/11/19  7:33 PM   Specimen: Nasal Mucosa; Nasopharyngeal  Result Value Ref Range Status   MRSA by PCR NEGATIVE NEGATIVE Final    Comment:        The GeneXpert MRSA Assay (FDA approved for NASAL specimens only), is one component of a comprehensive MRSA colonization surveillance program. It is not intended to diagnose MRSA infection nor to guide or monitor treatment for MRSA infections. Performed at [facility], [place] [address] [place], [place] [address]   Culture, Urine     Status: Abnormal   Collection Time: 08/11/19  8:15 PM   Specimen: Urine, Catheterized  Result Value Ref Range Status   Specimen Description URINE, CATHETERIZED  Final   Special Requests   Final    NONE Performed at [facility], [address] [place], [place] [address]    Culture MULTIPLE SPECIES PRESENT, SUGGEST RECOLLECTION (A)  Final   Report Status 08/13/2019 FINAL  Final  Urine Culture     Status: Abnormal   Collection Time: 08/19/19 11:55 AM   Specimen: Urine, Random  Result Value Ref Range Status   Specimen Description URINE, RANDOM  Final   Special Requests   Final    NONE Performed at [facility], [place] [address], [place],  [address]    Culture MULTIPLE SPECIES PRESENT, SUGGEST RECOLLECTION (A)  Final   Report Status 08/20/2019 FINAL  Final     Labs: BNP (last 3 results) No results for input(s): BNP in the last 8760 hours. Basic Metabolic Panel: Recent Labs  Lab 08/20/19 1012 08/20/19 1012 08/20/19 1558 08/20/19 1857 08/20/19 2337 08/21/19 0050 08/21/19 0624  NA 120*  120*   < > 119* 122* 125* 125* 125*  K 4.8  --  4.3  --  4.3 4.3 4.1  CL 84*  --  84*  --  87* 87* 88*  CO2 27  --  28  --  30 30 30   GLUCOSE 124*  --  161*  --  95 87 90  BUN 18  --  16  --  16 15 14   CREATININE 0.79  --  0.81  --  0.71 0.81 0.84  CALCIUM 8.6*  --  8.4*  --  8.7* 8.7* 8.6*   < > = values in this interval not displayed.   Liver Function Tests: Recent Labs  Lab 08/15/19 0554 08/20/19 1012  AST 48*  --   ALT 48*  --   ALKPHOS 85  --   BILITOT 0.7  --   PROT 4.9*  --   ALBUMIN 2.4* 2.9*   No results for input(s): LIPASE, AMYLASE in the last 168 hours. No results for input(s): AMMONIA in the last 168 hours. CBC: Recent Labs  Lab 08/15/19 0554 08/18/19 0438 08/21/19 0624  WBC 8.6 8.5 9.3  NEUTROABS 6.9  --   --   HGB 8.8* 9.0* 8.7*  HCT 27.2* 27.2* 26.0*  MCV 96.5  95.1 95.6  PLT 279 322 399   Cardiac Enzymes: No results for input(s): CKTOTAL, CKMB, CKMBINDEX, TROPONINI in the last 168 hours. BNP: Invalid input(s): POCBNP CBG: Recent Labs  Lab 08/21/19 1122  GLUCAP 107*   D-Dimer No results for input(s): DDIMER in the last 72 hours. Hgb A1c No results for input(s): HGBA1C in the last 72 hours. Lipid Profile No results for input(s): CHOL, HDL, LDLCALC, TRIG, CHOLHDL, LDLDIRECT in the last 72 hours. Thyroid function studies Recent Labs    08/19/19 1420 08/20/19 1558  TSH 12.163*  --   T3FREE  --  1.3*   Anemia work up No results for input(s): VITAMINB12, FOLATE, FERRITIN, TIBC, IRON, RETICCTPCT in the last 72 hours. Urinalysis    Component Value Date/Time   COLORURINE YELLOW 08/19/2019 1259   APPEARANCEUR CLOUDY (A) 08/19/2019 1259   LABSPEC 1.014 08/19/2019 1259   PHURINE 6.0 08/19/2019 1259   GLUCOSEU NEGATIVE 08/19/2019 1259   HGBUR MODERATE (A) 08/19/2019 1259   BILIRUBINUR NEGATIVE 08/19/2019 1259   KETONESUR NEGATIVE 08/19/2019 1259   PROTEINUR NEGATIVE 08/19/2019 1259   NITRITE NEGATIVE 08/19/2019 1259   LEUKOCYTESUR LARGE (A) 08/19/2019 1259   Sepsis Labs Invalid input(s): PROCALCITONIN,  WBC,  LACTICIDVEN Microbiology Recent Results (from the past 240 hour(s))  MRSA PCR Screening     Status: None   Collection Time: 08/11/19  7:33 PM   Specimen: Nasal Mucosa; Nasopharyngeal  Result Value Ref Range Status   MRSA by PCR NEGATIVE NEGATIVE Final    Comment:        The GeneXpert MRSA Assay (FDA approved for NASAL specimens only), is one component of a comprehensive MRSA colonization surveillance program. It is not intended to diagnose MRSA infection nor to guide or monitor treatment for MRSA infections. Performed at [facility], [place] [address], [place], [place] [address]   Culture, Urine     Status: Abnormal   Collection Time: 08/11/19  8:15 PM   Specimen: Urine, Catheterized  Result Value Ref Range  Status   Specimen Description URINE, CATHETERIZED  Final   Special Requests   Final    NONE Performed at [facility], [address], [place], [place] [address]    Culture MULTIPLE SPECIES PRESENT, SUGGEST RECOLLECTION (A)  Final   Report Status 08/13/2019 FINAL  Final  Urine Culture     Status: Abnormal   Collection Time: 08/19/19 11:55 AM   Specimen: Urine, Random  Result Value Ref Range Status   Specimen Description URINE, RANDOM  Final   Special Requests   Final    NONE Performed at [facility], [place] [address] [place], [place] [address]    Culture MULTIPLE SPECIES PRESENT, SUGGEST RECOLLECTION (A)  Final   Report Status 08/20/2019 FINAL  Final     Time coordinating discharge: Over 30 minutes  SIGNED:   Shawna Clamp, MD  [facility] 08/21/2019, 11:39 AM Pager  If 7PM-7AM, please contact night-coverage [email or web address]

## 2019-08-21 NOTE — Progress Notes (Signed)
Inpatient Rehabilitation Care Coordinator  Discharge Note  The overall goal for the admission was met for:   Discharge location: Yes, acute  Length of Stay: Yes, 7 Days  Discharge activity level: Yes  Home/community participation: Yes  Services provided included: MD, RD, PT, OT, SLP, RN, CM, TR, Pharmacy, Neuropsych and SW  Financial Services: Medicare  Follow-up services arranged: Other: D/C to Acute  Comments (or additional information):  Patient/Family verbalized understanding of follow-up arrangements: Yes  Individual responsible for coordination of the follow-up plan: [name] [phone]  Confirmed correct DME delivered: Dyanne Iha 08/21/2019    Dyanne Iha

## 2019-08-21 NOTE — Progress Notes (Addendum)
Inpatient Rehab Admissions Coordinator:   Notified by CSW that MD ready for pt to return to CIR.  Will need consult order and I will discuss with rehab MD.  Will f/u with pt today.   Shann Medal, PT, DPT Admissions Coordinator [phone] 08/21/19  9:23 AM

## 2019-08-21 NOTE — Progress Notes (Signed)
Family is going to place patient on home CPAP.

## 2019-08-21 NOTE — Progress Notes (Addendum)
Upon entering room, patient and family at bedside requesting inhaler. This nurse checked orders in [facility], patient has orders for PRN Xopenex. Patient had own inhaler in pocket. This nurse informed patient that home medications needed to be collected and sent to pharmacy. Patient refused to give this nurse his personal inhaler stating "If it's an emergency and I need it then I can't have it if you have it. It's called rescue for a reason." This nurse attempted to educate patient and family on importance of not taking inhaler too much, and the need for nurse administration of medications. Patient refused to give over home inhaler. Patient took inhaler (Xopenex). Wife at bedside with list of medication, insisting she speak to a doctor about changes made to medications. This nurse assured her that her notes and concerns would be heard during MD rounds in the morning. Charge nurse notified of patient non-compliance pertaining to home medications. Respiratory therapist entered room shortly after to answer questions for patient's family about respiratory medications.

## 2019-08-21 NOTE — Progress Notes (Signed)
[facility] Progress Note   86 y.o.maleCOPD oxygen dependent, BPH with foley for retention, PAF, HTN, CASHD s/p CAVG, PAD with chronic revascularizations leading to a Rt AKA by Dr. Scot Dock. We were consulted for hyponatremia. Na was 136 on 8/2 and since then has been steadily decreasing with normal renal function. Lexapro was last given on day of consultation but was discontinued. Pt was also treated with Lasix 20mg  twice daily Since 8/5. He has had very mild nausea only in the am but has phantom pain present.   Assessment/ Plan:   1. Hyponatremia - appears to be hypovolemic on PE and also with increasing HCO3 possibly representing contraction alkalosis. Nausea has been very mild, has sinus drainage, and pain as well which can all lead to ADH release but I am concerned that he may be volume down which can also cause hyponatremia. - Na continued to drop despite trial with isotonic hydration +  holding diuretics. Concerned this may be  SIADH and  Na can drop more with isotonic fluids. - Urine studies Na 25, osmolality urine 369 blood 259, TSH (12.16), cortisol -> check T3T4  Did not req the 3% and Na starting to rise with tolvaptan. Will 2nd dose of Tolvaptan today.  Can transfer back to CIR.  - Possible offending agents have already been discontinued.  Will update son Randall Hiss  [phone] again today.  2. PAD s/p rt AKA 3. COPD on O2 and breathing treatments. 4. CASHD 5. CHF (diastolic) 6. HTN 7. CKD3 (BL1-1.1)  Subjective:   More dyspneic, stating breathing treatments are very diff than his home regimen. Denies nausea. Asking to get his  feet out of the boot so he can dangle it. Denies dizziness or nausea.   Objective:   BP 126/74 (BP Location: Right Arm)   Pulse 88   Temp 98.1 F (36.7 C) (Oral)   Resp 18   Ht 5\' 9"  (1.753 m)   Wt 56.7 kg   SpO2 98%   BMI 18.46 kg/m   Intake/Output Summary (Last 24 hours) at 08/21/2019 0844 Last data filed at  08/21/2019 1025 Gross per 24 hour  Intake 240 ml  Output 1100 ml  Net -860 ml   Weight change:   Physical Exam: GEN: NAD, A&Ox3, HOH HEENT: No conjunctival pallor, EOMI NECK: Supple, no thyromegaly LUNGS: CTA B/L no rales CV: RRR, No M/R/G ABD: SNDNT +BS  EXT: Trace to 1+ lower extremity edema  Imaging: DG Abd 1 View  Result Date: 08/19/2019 CLINICAL DATA:  Abdominal pain EXAM: ABDOMEN - 1 VIEW COMPARISON:  12/03/2017 FINDINGS: Nonobstructive bowel gas pattern. Moderate-sized rectal stool bal. 8 mm calcification projects over the right renal shadow. No other significant radiographic abnormality are seen. Aortoiliac atherosclerosis. IMPRESSION: 1. Nonobstructive bowel gas pattern. 2. Moderate-sized rectal stool burden. 3. Right-sided nephrolithiasis. Electronically Signed   By: Davina Poke D.O.   On: 08/19/2019 12:40    Labs: BMET Recent Labs  Lab 08/19/19 1927 08/19/19 1927 08/20/19 0624 08/20/19 1012 08/20/19 1558 08/20/19 1857 08/20/19 2337 08/21/19 0050 08/21/19 0624  NA 119*   < > 120* 120*  120* 119* 122* 125* 125* 125*  K 4.7  --  4.2 4.8 4.3  --  4.3 4.3 4.1  CL 82*  --  84* 84* 84*  --  87* 87* 88*  CO2 30  --  27 27 28   --  30 30 30   GLUCOSE 110*  --  89 124* 161*  --  95 87 90  BUN 18  --  16 18 16   --  16 15 14   CREATININE 0.81  --  0.78 0.79 0.81  --  0.71 0.81 0.84  CALCIUM 8.5*  --  8.5* 8.6* 8.4*  --  8.7* 8.7* 8.6*   < > = values in this interval not displayed.   CBC Recent Labs  Lab 08/15/19 0554 08/18/19 0438 08/21/19 0624  WBC 8.6 8.5 9.3  NEUTROABS 6.9  --   --   HGB 8.8* 9.0* 8.7*  HCT 27.2* 27.2* 26.0*  MCV 96.5 95.1 95.6  PLT 279 322 399    Medications:    . aspirin EC  81 mg Oral Daily  . atorvastatin  20 mg Oral QHS  . enoxaparin (LOVENOX) injection  40 mg Subcutaneous Q24H  . metoprolol succinate  12.5 mg Oral Daily  . mometasone-formoterol  2 puff Inhalation BID  . sodium chloride flush  3 mL Intravenous Q12H  .  sodium chloride flush  3 mL Intravenous Q12H  . umeclidinium bromide  1 puff Inhalation Daily      Otelia Santee, MD 08/21/2019, 8:44 AM

## 2019-08-22 ENCOUNTER — Inpatient Hospital Stay (HOSPITAL_COMMUNITY): Payer: Medicare Other | Admitting: Occupational Therapy

## 2019-08-22 ENCOUNTER — Inpatient Hospital Stay (HOSPITAL_COMMUNITY): Payer: Medicare Other

## 2019-08-22 DIAGNOSIS — D62 Acute posthemorrhagic anemia: Secondary | ICD-10-CM

## 2019-08-22 DIAGNOSIS — Z89611 Acquired absence of right leg above knee: Secondary | ICD-10-CM

## 2019-08-22 DIAGNOSIS — Z9981 Dependence on supplemental oxygen: Secondary | ICD-10-CM | POA: Diagnosis not present

## 2019-08-22 DIAGNOSIS — E871 Hypo-osmolality and hyponatremia: Secondary | ICD-10-CM

## 2019-08-22 DIAGNOSIS — N401 Enlarged prostate with lower urinary tract symptoms: Secondary | ICD-10-CM

## 2019-08-22 DIAGNOSIS — J449 Chronic obstructive pulmonary disease, unspecified: Secondary | ICD-10-CM

## 2019-08-22 DIAGNOSIS — R338 Other retention of urine: Secondary | ICD-10-CM

## 2019-08-22 DIAGNOSIS — I5022 Chronic systolic (congestive) heart failure: Secondary | ICD-10-CM

## 2019-08-22 DIAGNOSIS — G8918 Other acute postprocedural pain: Secondary | ICD-10-CM

## 2019-08-22 LAB — BASIC METABOLIC PANEL
Anion gap: 8 (ref 5–15)
Anion gap: 9 (ref 5–15)
Anion gap: 9 (ref 5–15)
BUN: 14 mg/dL (ref 8–23)
BUN: 13 mg/dL (ref 8–23)
BUN: 17 mg/dL (ref 8–23)
CO2: 31 mmol/L (ref 22–32)
CO2: 29 mmol/L (ref 22–32)
CO2: 27 mmol/L (ref 22–32)
Calcium: 9.4 mg/dL (ref 8.9–10.3)
Calcium: 9.4 mg/dL (ref 8.9–10.3)
Calcium: 9.2 mg/dL (ref 8.9–10.3)
Chloride: 93 mmol/L — ABNORMAL LOW (ref 98–111)
Chloride: 93 mmol/L — ABNORMAL LOW (ref 98–111)
Chloride: 94 mmol/L — ABNORMAL LOW (ref 98–111)
Creatinine, Ser: 0.87 mg/dL (ref 0.61–1.24)
Creatinine, Ser: 0.87 mg/dL (ref 0.61–1.24)
Creatinine, Ser: 0.95 mg/dL (ref 0.61–1.24)
GFR calc Af Amer: >60 mL/min (ref >60)
GFR calc Af Amer: >60 mL/min (ref >60)
GFR calc Af Amer: >60 mL/min (ref >60)
GFR calc non Af Amer: >60 mL/min (ref >60)
GFR calc non Af Amer: >60 mL/min (ref >60)
GFR calc non Af Amer: >60 mL/min (ref >60)
Glucose, Bld: 92 mg/dL (ref 70–99)
Glucose, Bld: 127 mg/dL — ABNORMAL HIGH (ref 70–99)
Glucose, Bld: 195 mg/dL — ABNORMAL HIGH (ref 70–99)
Potassium: 4.4 mmol/L (ref 3.5–5.1)
Potassium: 4.8 mmol/L (ref 3.5–5.1)
Potassium: 4.8 mmol/L (ref 3.5–5.1)
Sodium: 132 mmol/L — ABNORMAL LOW (ref 135–145)
Sodium: 131 mmol/L — ABNORMAL LOW (ref 135–145)
Sodium: 130 mmol/L — ABNORMAL LOW (ref 135–145)

## 2019-08-22 LAB — CBC WITH DIFFERENTIAL/PLATELET
Abs Immature Granulocytes: 0.15 10*3/uL — ABNORMAL HIGH (ref 0.00–0.07)
Basophils Absolute: 0 10*3/uL (ref 0.0–0.1)
Basophils Relative: 0 %
Eosinophils Absolute: 0.1 10*3/uL (ref 0.0–0.5)
Eosinophils Relative: 2 %
HCT: 26.1 % — ABNORMAL LOW (ref 39.0–52.0)
Hemoglobin: 8.4 g/dL — ABNORMAL LOW (ref 13.0–17.0)
Immature Granulocytes: 2 %
Lymphocytes Relative: 6 %
Lymphs Abs: 0.5 10*3/uL — ABNORMAL LOW (ref 0.7–4.0)
MCH: 31.3 pg (ref 26.0–34.0)
MCHC: 32.2 g/dL (ref 30.0–36.0)
MCV: 97.4 fL (ref 80.0–100.0)
Monocytes Absolute: 1.1 10*3/uL — ABNORMAL HIGH (ref 0.1–1.0)
Monocytes Relative: 13 %
Neutro Abs: 6.2 10*3/uL (ref 1.7–7.7)
Neutrophils Relative %: 77 %
Platelets: 373 10*3/uL (ref 150–400)
RBC: 2.68 MIL/uL — ABNORMAL LOW (ref 4.22–5.81)
RDW: 13.2 % (ref 11.5–15.5)
WBC: 8.1 10*3/uL (ref 4.0–10.5)
nRBC: 0 % (ref 0.0–0.2)

## 2019-08-22 LAB — COMPREHENSIVE METABOLIC PANEL
ALT: 39 U/L (ref 0–44)
AST: 29 U/L (ref 15–41)
Albumin: 2.8 g/dL — ABNORMAL LOW (ref 3.5–5.0)
Alkaline Phosphatase: 77 U/L (ref 38–126)
Anion gap: 10 (ref 5–15)
BUN: 13 mg/dL (ref 8–23)
CO2: 30 mmol/L (ref 22–32)
Calcium: 9.4 mg/dL (ref 8.9–10.3)
Chloride: 94 mmol/L — ABNORMAL LOW (ref 98–111)
Creatinine, Ser: 0.85 mg/dL (ref 0.61–1.24)
GFR calc Af Amer: >60 mL/min (ref >60)
GFR calc non Af Amer: >60 mL/min (ref >60)
Glucose, Bld: 94 mg/dL (ref 70–99)
Potassium: 4.1 mmol/L (ref 3.5–5.1)
Sodium: 134 mmol/L — ABNORMAL LOW (ref 135–145)
Total Bilirubin: 0.6 mg/dL (ref 0.3–1.2)
Total Protein: 4.9 g/dL — ABNORMAL LOW (ref 6.5–8.1)

## 2019-08-22 LAB — SODIUM: Sodium: 132 mmol/L — ABNORMAL LOW (ref 135–145)

## 2019-08-22 MED ORDER — LIDOCAINE 5 % EX PTCH
1.0000 | MEDICATED_PATCH | Freq: Every day | CUTANEOUS | Status: DC
  Administered 2019-08-22 – 2019-08-25 (×3): 1 via TRANSDERMAL
  Filled 2019-08-22 (×4): qty 1

## 2019-08-22 MED ORDER — CHLORHEXIDINE GLUCONATE CLOTH 2 % EX PADS
6.0000 | MEDICATED_PAD | Freq: Every day | CUTANEOUS | Status: DC
  Administered 2019-08-22 – 2019-08-31 (×4): 6 via TOPICAL

## 2019-08-22 MED ORDER — AZITHROMYCIN 250 MG PO TABS
250.0000 mg | ORAL_TABLET | Freq: Every day | ORAL | Status: DC
  Administered 2019-08-22 – 2019-09-01 (×11): 250 mg via ORAL
  Filled 2019-08-22 (×11): qty 1

## 2019-08-22 MED ORDER — HYDROCERIN EX CREA
TOPICAL_CREAM | Freq: Two times a day (BID) | CUTANEOUS | Status: DC
  Administered 2019-08-22 – 2019-08-24 (×2): 1 via TOPICAL
  Filled 2019-08-22: qty 113

## 2019-08-22 MED ORDER — SODIUM CHLORIDE (HYPERTONIC) 5 % OP SOLN: 1.0000 [drp] | Freq: Four times a day (QID) | OPHTHALMIC | Status: AC

## 2019-08-22 MED ORDER — AZITHROMYCIN 250 MG PO TABS: ORAL_TABLET | ORAL | 0 refills | Status: DC

## 2019-08-22 MED ORDER — ENSURE ENLIVE PO LIQD
237.0000 mL | Freq: Three times a day (TID) | ORAL | Status: DC
  Administered 2019-08-22 – 2019-09-01 (×11): 237 mL via ORAL

## 2019-08-22 MED ORDER — FUROSEMIDE 20 MG PO TABS
20.0000 mg | ORAL_TABLET | Freq: Every day | ORAL | Status: DC
  Administered 2019-08-22 – 2019-08-28 (×7): 20 mg via ORAL
  Filled 2019-08-22 (×7): qty 1

## 2019-08-22 MED ORDER — PROSOURCE PLUS PO LIQD
30.0000 mL | Freq: Two times a day (BID) | ORAL | Status: DC
  Administered 2019-08-22 – 2019-08-31 (×16): 30 mL via ORAL
  Filled 2019-08-22 (×15): qty 30

## 2019-08-22 NOTE — Progress Notes (Signed)
[facility] Progress Note   86y.o.maleCOPD oxygen dependent, BPH with foley for retention, PAF, HTN, CASHD s/p CAVG, PAD with chronic revascularizations leading to a Rt AKA by Dr. Scot Dock. We were consulted for hyponatremia. Na was 136 on 8/2 and since then has been steadily decreasing with normal renal function. Lexapro was last given on day of consultation but was discontinued. Pt was also treated with Lasix 20mg  twice daily Since 8/5. He has had very mild nausea only in the am but has phantom pain present.   Assessment/ Plan:   1. Hyponatremia - appears to be hypovolemic on PE and also with increasing HCO3 possibly representing contraction alkalosis. Nausea has been very mild, has sinus drainage, and pain as well which can all lead to ADH release but I am concerned that he may be volume down which can also cause hyponatremia. -Na continued to drop despite trial with isotonic hydration + holdingdiuretics. Concerned this may beSIADH andNa can drop more with isotonic fluids. - Urine studiesNa 25, osmolalityurine 369 blood 259, TSH(12.16), cortisol-> check T3T4  Did not req the 3% and Na starting to rise with tolvaptan.  2nd dose of Tolvaptan on 8/12.  Will hold off on Tolvaptan for now. Lasix 20mg  daily  - Possible offending agents have already been discontinued.  Update son Randall Hiss  [phone] again today.  2. PAD s/p rt AKA 3. COPD on O2 and breathing treatments. 4. CASHD 5. CHF (diastolic) 6. HTN 7. CKD3 (BL1-1.1)  Subjective:   Dyspneic but not worse than baseline; he's asking about his breathing treatment regimen. Denies n/v   Objective:   BP (!) 108/52 (BP Location: Left Arm)   Pulse 95   Temp 97.8 F (36.6 C) (Oral)   Resp 20   Ht 5\' 9"  (1.753 m)   Wt 53.5 kg   SpO2 98%   BMI 17.42 kg/m   Intake/Output Summary (Last 24 hours) at 08/22/2019 1532 Last data filed at 08/22/2019 0800 Gross per 24 hour  Intake 200 ml  Output 2750 ml   Net -2550 ml   Weight change:   Physical Exam: GEN: NAD, A&Ox3,HOH HEENT: No conjunctival pallor, EOMI NECK: Supple, no thyromegaly LUNGS: CTA B/L no rales CV: RRR, No M/R/G ABD: SNDNT +BS  EXT: Trace lower extremity edema  Imaging: No results found.  Labs: BMET Recent Labs  Lab 08/20/19 2337 08/20/19 2337 08/21/19 0050 08/21/19 0050 08/21/19 0624 08/21/19 1057 08/21/19 1625 08/22/19 0216 08/22/19 0600 08/22/19 0857 08/22/19 1201  NA 125*   < > 125*   < > 125* 125* 127* 132* 134* 132* 131*  K 4.3  --  4.3  --  4.1  --  4.3 4.4 4.1  --  4.8  CL 87*  --  87*  --  88*  --  89* 93* 94*  --  93*  CO2 30  --  30  --  30  --  31 31 30   --  29  GLUCOSE 95  --  87  --  90  --  134* 92 94  --  127*  BUN 16  --  15  --  14  --  14 14 13   --  13  CREATININE 0.71  --  0.81  --  0.84  --  0.95 0.87 0.85  --  0.87  CALCIUM 8.7*  --  8.7*  --  8.6*  --  8.8* 9.4 9.4  --  9.4   < > = values in this  interval not displayed.   CBC Recent Labs  Lab 08/18/19 0438 08/21/19 0624 08/22/19 0600  WBC 8.5 9.3 8.1  NEUTROABS  --   --  6.2  HGB 9.0* 8.7* 8.4*  HCT 27.2* 26.0* 26.1*  MCV 95.1 95.6 97.4  PLT 322 399 373    Medications:    . (feeding supplement) PROSource Plus  30 mL Oral BID BM  . aspirin EC  81 mg Oral Daily  . atorvastatin  20 mg Oral QHS  . azithromycin  250 mg Oral Daily  . budesonide-formoterol  2 puff Inhalation BID  . Chlorhexidine Gluconate Cloth  6 each Topical Daily  . enoxaparin (LOVENOX) injection  40 mg Subcutaneous Q24H  . famotidine  20 mg Oral QHS  . feeding supplement (ENSURE ENLIVE)  237 mL Oral TID BM  . finasteride  5 mg Oral Q lunch  . fluticasone  2 spray Each Nare Daily  . hydrocerin   Topical BID  . lidocaine  1 patch Transdermal Daily  . metoprolol succinate  12.5 mg Oral Daily  . multivitamin with minerals  1 tablet Oral Daily  . predniSONE  10 mg Oral Q breakfast  . sodium chloride  1 drop Both Eyes QID  . Tiotropium Bromide  Monohydrate  2 puff Inhalation Daily  . vitamin B-12  1,000 mcg Oral Daily      Otelia Santee, MD 08/22/2019, 3:32 PM

## 2019-08-22 NOTE — Progress Notes (Signed)
Pt was transferred to 2M14 on 08/11 by Probation officer. Wife present during transfer.

## 2019-08-22 NOTE — Progress Notes (Signed)
Initial Nutrition Assessment  RD working remotely.  DOCUMENTATION CODES:   Underweight, suspect malnutrition but unable to confirm without NFPE  INTERVENTION:   - Recommend liberalizing diet to Regular to promote PO intake  - Ensure Enlive po TID, each supplement provides 350 kcal and 20 grams of protein  - ProSource Plus po BID, each supplement provides 100 kcal and 15 grams of protein  - Vanilla Greek yogurt TID with meals  - Continue MVI with minerals daily  NUTRITION DIAGNOSIS:   Increased nutrient needs related to wound healing, chronic illness (COPD) as evidenced by estimated needs.  GOAL:   Patient will meet greater than or equal to 90% of their needs  MONITOR:   PO intake, Supplement acceptance, Labs, Weight trends, Skin  REASON FOR ASSESSMENT:   Malnutrition Screening Tool    ASSESSMENT:   86 year old male with PMH of COPD on 2L O2 at baseline, BPH with foley for retention, PAF, CAD s/p CABG, PVD with chronic nonhealing wound on right foot. Pt was admitted on 8/02 for right AKA. Admitted to CIR on 8/05. Pt transferred to acute care for hyponatremia then readmitted to CIR on 8/12.  Unable to reach pt or wife via phone call to room. RD familiar with pt from initial assessment to CIR (pt readmitted as an interrupted stay). Please see note from 08/15/19 for details.  Pt's UBW is 134 lbs. Reviewed weight history in chart. Pt with a weight loss of 10.8 kg since 07/02/19. This is a 16.8% weight loss in less than 2 months which is severe and significant for timeframe. Suspect pt with malnutrition but RD unable to confirm without NFPE. Will completed at follow-up.  Pt previously accepting Ensure Enlive and ProSource supplements on and off. RD to reorder. Pt also likes vanilla Greek yogurt. RD to order TID with meals.  Recommend liberalizing pt's diet to Regular to promote PO intake as pt likes the burgers and other items he is not able to order with the Heart Healthy diet  restrictions.  Meal Completion: 75% x 1 documented meal  Medications reviewed and include: pepcid, MVI with minerals, prednisone, vitamin B-12 1000 mcg daily  Labs reviewed: sodium 134, hemoglobin 8.4  NUTRITION - FOCUSED PHYSICAL EXAM:  Unable to complete at this time. RD working remotely.  Diet Order:   Diet Order            Diet Heart Room service appropriate? Yes; Fluid consistency: Thin; Fluid restriction: 1800 mL Fluid  Diet effective now                 EDUCATION NEEDS:   Not appropriate for education at this time  Skin:  Skin Assessment: Skin Integrity Issues: DTI: left foot Stage II: coccyx Other: MASD to perineum  Last BM:  08/19/19  Height:   Ht Readings from Last 1 Encounters:  08/21/19 5\' 9"  (1.753 m)    Weight:   Wt Readings from Last 1 Encounters:  08/22/19 53.5 kg    Ideal Body Weight:  72.7 kg  BMI:  Body mass index is 17.42 kg/m.  Estimated Nutritional Needs:   Kcal:  1850-2050  Protein:  85-100 grams  Fluid:  1.8-2.0 L    Gaynell Face, MS, RD, LDN Inpatient Clinical Dietitian Please see AMiON for contact information.

## 2019-08-22 NOTE — Progress Notes (Signed)
Physical Therapy Session Note  Patient Details  Name: [name] MRN: [id number] Date of Birth: 03-06-1929  Today's Date: 08/22/2019 PT Individual Time: 0093-8182 and 1400-1448 PT Individual Time Calculation (min): 58 min and 48 min  Short Term Goals: Week 1:  PT Short Term Goal 1 (Week 1): pt will perform bed mobility with supervision PT Short Term Goal 2 (Week 1): Pt will transfer sit<>stand with LRAD min A PT Short Term Goal 3 (Week 1): Pt will perform simulated car transfer with LRAD mod A  Skilled Therapeutic Interventions/Progress Updates:   Treatment Session 1: 0915-1013 58 min Received pt supine in bed, reporting pain 7/10 in R residual limb and in stomach (premedicated), and stating "I'm still not doing well." Pt reported fatigue and SOB throughout session. Repositioning, maximal rest breaks, and distraction done to reduce pain levels. [name] present at bedside. Session with emphasis on dressing, functional mobility/transfers, generalized strengthening, dynamic sitting balance/coordination, and improved activity tolerance. Pt on 2L O2 via [place] throughout session with O2 sat >93% but pt continues to report SOB. O2 sat 96% and HR 91bpm on 2L O2 via [place] at rest. Donned underwear and pants in supine with max A and increased time due to fatigue and poor activity tolerance. Pt rolled to L and R with CGA and use of bedrails and transferred supine<>sitting EOB with mod A and use of bedrails and doffed dirty gown and donned clean pull over shirt with mod A. Pt transferred bed<>WC via lateral scoot min A with cues for hand placement on WC and on armrests. O2 sat 93% after transfer and pt required extensive rest break. Therapist provided pt with 16x16 WC, amputee support pad, and RW and pt transported to ortho gym in [facility] total A and performed bilateral UE strengthening on UBE for 3 minutes forward and 3 minutes backwards on level 0 with supervision with maximal rest/water breaks throughout. Pt  transported back to room in [facility] total A. Concluded session with pt sitting in WC, needs within reach, and seatbelt alarm on. L PRAFO boot donned for pressure relief of pt's heel. O2 sat 95% on 2L and pt's wife at bedside.   Treatment Session 2: 9937-1696 48 min Received pt supine in bed asleep, pt required maximal verbal and tactile stimuli to arouse. Once woken, pt reported pain 5/10 in R residual limb and along frontal sinuses (premedicated). Repositioning, maximal rest breaks, and distraction done to reduce pain levels. Session with emphasis on functional mobility/transfers, generalized strengthening, dynamic standing balance/coordination, simulated car transfers, and improved activity tolerance. Pt on 2L O2 via [place] throughout session with O2 sat >93% but pt continues to report SOB and demonstrates significant anxiety and signs of depression. Pt rolled to L with HOB elevated and use of bedrails with mod A for trunk control. Pt transferred sit<>stand with RW mod A and transferred stand<>pivot bed<>WC with RW max A due to LOB and pt sitting prior to backing up completly to WC. Pt required cues for technique and safety when turning, however pt with poor carry over. Pt agreeable to going out to gym with convincing but stated "do I have to do anything?". Therapist educated pt on importance of participating in therapy for improved independence to decrease caregiver burden at home. Pt transported to ortho gym in [facility] total A and performed simulated car transfer with RW and mod A +2 for equipment with significantly increased time. Pt with increased anxiety stating "I can't do this anymore" and "If I had the  courage I'd end this" (unsure what pt was referring to when he said this; possibly referring to his amputation). Therapist encouraged deep breathing and pursed lip breathing and pt reported feeling "a little" better after a few minutes. Pt transported back to room in [facility] total A and transferred WC<>bed via lateral scoot  min A and sit<>supine with CGA. Noted blood on pt's pants from insertion of Foley. RN made aware and present to untangle catheter and prevent further bleeding. Concluded session with pt supine in bed, needs within reach, and bed alarm on.     Therapy Documentation Precautions:  Restrictions Weight Bearing Restrictions: Yes RLE Weight Bearing: Non weight bearing   Therapy/Group: Individual Therapy Alfonse Alpers PT, DPT   08/22/2019, 7:34 AM

## 2019-08-22 NOTE — H&P (Signed)
Please see previous PM&R H&P, pt interrupted stay.

## 2019-08-22 NOTE — Progress Notes (Addendum)
[name] PHYSICAL MEDICINE & REHABILITATION PROGRESS NOTE  Subjective/Complaints: Patient seen sitting up at the edge of his bed, falling asleep eating breakfast.  He states he slept fairly overnight.  He has questions regarding recent "sodium meds" and lethargy.   ROS: +SOB.  Denies CP, N/V/D  Objective: Vital Signs: Blood pressure (!) 102/58, pulse 92, temperature 98.3 F (36.8 C), temperature source Oral, resp. rate 14, height 5\' 9"  (1.753 m), weight 53.5 kg, SpO2 97 %. No results found. Recent Labs    08/21/19 0624 08/22/19 0600  WBC 9.3 8.1  HGB 8.7* 8.4*  HCT 26.0* 26.1*  PLT 399 373   Recent Labs    08/22/19 0216 08/22/19 0600  NA 132* 134*  K 4.4 4.1  CL 93* 94*  CO2 31 30  GLUCOSE 92 94  BUN 14 13  CREATININE 0.87 0.85  CALCIUM 9.4 9.4    Physical Exam: BP (!) 102/58 (BP Location: Left Arm)   Pulse 92   Temp 98.3 F (36.8 C) (Oral)   Resp 14   Ht 5\' 9"  (1.753 m)   Wt 53.5 kg   SpO2 97%   BMI 17.42 kg/m  Constitutional: No distress . Vital signs reviewed. HENT: Normocephalic.  Atraumatic. Eyes: EOMI. No discharge. Cardiovascular: No JVD. Regular rate, premature contractions. Respiratory: Normal effort.  No stridor. Decreased breath sounds.  +[name]. GI: Non-distended. BS+. Skin: Right AKA with dressing CDI Psych: Normal mood.  Normal behavior. Musc: Right AKA with edema and tenderness. Neuro: Alert Extreme HOH Motor: Bilateral upper extremities: 4/5 proximal distally  Left lower extremity: 3+/5 proximal distal, unchanged. Right lower extremity: Hip flexion, knee extension 3+/5 (some pain patient), unchanged  Assessment/Plan: 1. Functional deficits secondary to right AKA which require 3+ hours per day of interdisciplinary therapy in a comprehensive inpatient rehab setting.  Physiatrist is providing close team supervision and 24 hour management of active medical problems listed below.  Physiatrist and rehab team continue to assess barriers to  discharge/monitor patient progress toward functional and medical goals  Care Tool:  Bathing              Bathing assist       Upper Body Dressing/Undressing Upper body dressing        Upper body assist      Lower Body Dressing/Undressing Lower body dressing            Lower body assist       Toileting Toileting    Toileting assist Assist for toileting: Moderate Assistance - Patient 50 - 74%     Transfers Chair/bed transfer  Transfers assist           Locomotion Ambulation   Ambulation assist              Walk 10 feet activity   Assist           Walk 50 feet activity   Assist           Walk 150 feet activity   Assist           Walk 10 feet on uneven surface  activity   Assist           Wheelchair     Assist               Wheelchair 50 feet with 2 turns activity    Assist            Wheelchair 150 feet activity     Assist  Medical Problem List and Plan: 1.  Impaired mobility and ADLs secondary to right AKA with significant medical comorbidities  Continue CIR, 15/7 2.  Antithrombotics: -DVT/anticoagulation:  Pharmaceutical: Heparin             -antiplatelet therapy: Aspirin 81mg  daily 3. Pain Management: Has residual limb pain--educate on desensitization techniques.   Norco will be discontinued. Continue tramadol   Lidoderm patch added for left heel on 8/9  Relatively controlled on 8/13  Monitor with increased exertion. 4. Mood: Appears depressed--team to provide ego support. LCSW to follow for evaluation and support.    Will hold off on antidepressant given sodium levels   Wife may spend the night to assist with patient compliance and anxiety.             -antipsychotic agents: N/A 5. Neuropsych: This patient may be capable of making decisions on his own behalf. Intermittently disoriented.  6. Skin/Wound Care:  Monitor wound for healing.    Stump shrinker for  edema control  7. Fluids/Electrolytes/Nutrition: Monitor I/Os 8.COPD/chronic hypoxic respiratory failure:   Continues to require supplemental oxygen (baseline)  Chest x-ray personally reviewed, mild effusion  ECG personally reviewed, showing premature complexes  Appreciate Pulm recs  Will ask pharm to educate  Need to continue to stress compliance 9. BPH/renal calculi:   Continue foley  Continue finasteride.  10. Anxiety d/o: continue Xanax tid prn. 11. Hyponatremia:   Sodium 134 on 8/13, labs per Nephro  Appreciate Nephro recs  See #15 12. ABLA:   Hemoglobin 8.4 on 8/13, labs ordered for Monday 13. Nasal congestion: Afrin nasal spray X 3 days.  Added saline spray as well as humidification.   14. HTN: Has history of orthostatic hypotension.  Monitor BP   Continue aldactone 12.5/25 mgQOD and Toprol XL.   Lasix d/ced per Nephro  Controlled on 8/13  Monitor with increased exertion 15. CAD/Chronic systolic CHF: Monitor for symptoms with increase in activity. On aldactone, toprol XL, ASA  and Lipitor Filed Weights   08/21/19 1430 08/22/19 0312  Weight: 59.3 kg 53.5 kg   Lasix d/ced per Nephro  ?Reliability on 8/13 16. Transaminitis: Resolved  LFTs WNL on 8/13  Continue to monitor 17.  Severe hypoalbuminemia  Supplement initiated  18.  Chronic sinusitis according to patient's wife has been seen at [name] who recommended a 33-month course of azithromycin.    Azithromycin at 250 mg/day   LOS: 1 days A FACE TO FACE EVALUATION WAS PERFORMED  [name] 08/22/2019, 8:41 AM

## 2019-08-22 NOTE — Progress Notes (Signed)
Pt has home cpap and family will place him on tonight. RT will continue to monitor.

## 2019-08-22 NOTE — Progress Notes (Signed)
   Patient Details  Name: [name] MRN: [id number] Date of Birth: July 20, 1929  Today's Date: 08/22/2019  Hospital Problems: Active Problems:   S/P AKA (above knee amputation) (HCC)   Post-operative pain   Chronic systolic congestive heart failure [facility])  Past Medical History:  Past Medical History:  Diagnosis Date  . Anxiety   . Arthritis    "generalized" (04/04/2017)  . Asthma   . Basal cell carcinoma    "left ear"  . BPH (benign prostatic hyperplasia)    severe; s/p multiple biopsies  . CAD (coronary artery disease)   . Carotid artery occlusion   . Chronic respiratory failure ([place])   . Chronic rhinitis   . COPD (chronic obstructive pulmonary disease) (HCC)    2L [place] O2  . Diastolic heart failure ([place]) 2019  . Dilation of biliary tract   . Elevated troponin 03/09/2017  . Gallstones   . GERD (gastroesophageal reflux disease)   . History of blood transfusion    "w/his CABG" (04/04/2017)  . History of kidney stones   . Hyperlipidemia   . Hypertension   . On home oxygen therapy    "~ 24/7" (04/04/2017)  . Peripheral vascular disease ([place])   . Pneumonia 2019  . Squamous cell carcinoma of skin 04/23/2013   in situ-Right flank (txpbx)  . Squamous cell carcinoma of skin 01/23/2017   [place]/ in siut- Left ear rim (CX3FU)  . Syncope and collapse   . Ureteral tumor 08/2015   had endoscopic procedure for evaluation, unable to reach for biopsy  . Urinary catheter (Foley) change required    Past Surgical History:  Past Surgical History:  Procedure Laterality Date  . ABDOMINAL AORTOGRAM W/LOWER EXTREMITY Bilateral 06/30/2019   Procedure: ABDOMINAL AORTOGRAM W/LOWER EXTREMITY;  Surgeon: Waynetta Sandy, MD;  Location: [facility];  Service: Cardiovascular;  Laterality: Bilateral;  . AMPUTATION Right 08/11/2019   Procedure: RIGHT ABOVE KNEE AMPUTATION;  Surgeon: Angelia Mould, MD;  Location: [place];  Service: Vascular;  Laterality: Right;  . BASAL CELL  CARCINOMA EXCISION Left    ear  . CARDIAC CATHETERIZATION     "prior to bypass"  . CATARACT EXTRACTION W/ INTRAOCULAR LENS  IMPLANT, BILATERAL Bilateral   . CORONARY ARTERY BYPASS GRAFT  10/2001   LIMA to LAD, SVG to OM1-2, SVG to RCA and PDA  . LITHOTRIPSY    . PROSTATE BIOPSY  Oct. 2014  . TONSILLECTOMY     Social History:  reports that he quit smoking about 37 years ago. His smoking use included cigarettes. He has a 66.00 pack-year smoking history. He has never used smokeless tobacco. He reports current alcohol use. He reports that he does not use drugs.  Family / Support Systems    Social History Preferred language: English Religion: [employer]     Abuse/Neglect    Emotional Status    Patient / Family Perceptions, Expectations & Goals    Community Resources    Discharge Planning    Clinical Impression Pt discharged off unit due to medical concerns. Pt has returned to unit, sw familiar with pt. Please refer to initial assessment.   Dyanne Iha 08/22/2019, 2:25 PM

## 2019-08-22 NOTE — Progress Notes (Addendum)
Occupational Therapy Session Note  Patient Details  Name: [name] MRN: [id number] Date of Birth: 05/30/1929  Today's Date: 08/22/2019 OT Individual Time: 1104-1158 OT Individual Time Calculation (min): 54 min  Hospital Problem: Principal Problem:   S/P AKA (above knee amputation) (HCC) Active Problems:   Hypoalbuminemia due to protein-calorie malnutrition (HCC)   Transaminitis   Acute blood loss anemia   Hyponatremia   Postoperative pain   Supplemental oxygen dependent   Past Medical History:      Past Medical History:  Diagnosis Date  . Anxiety   . Arthritis    "generalized" (04/04/2017)  . Asthma   . Basal cell carcinoma    "left ear"  . BPH (benign prostatic hyperplasia)    severe; s/p multiple biopsies  . CAD (coronary artery disease)   . Carotid artery occlusion   . Chronic respiratory failure (HCC)   . Chronic rhinitis   . COPD (chronic obstructive pulmonary disease) (HCC)    2L [place] O2  . Diastolic heart failure (HCC) 2019  . Dilation of biliary tract   . Elevated troponin 03/09/2017  . Gallstones   . GERD (gastroesophageal reflux disease)   . History of blood transfusion    "w/his CABG" (04/04/2017)  . History of kidney stones   . Hyperlipidemia   . Hypertension   . On home oxygen therapy    "~ 24/7" (04/04/2017)  . Peripheral vascular disease (HCC)   . Pneumonia 2019  . Squamous cell carcinoma of skin 04/23/2013   in situ-Right flank (txpbx)  . Squamous cell carcinoma of skin 01/23/2017   [place]/ in siut- Left ear rim (CX3FU)  . Syncope and collapse   . Ureteral tumor 08/2015   had endoscopic procedure for evaluation, unable to reach for biopsy  . Urinary catheter (Foley) change required    Past Surgical History:       Past Surgical History:  Procedure Laterality Date  . ABDOMINAL AORTOGRAM W/LOWER EXTREMITY Bilateral 06/30/2019   Procedure: ABDOMINAL AORTOGRAM W/LOWER EXTREMITY;  Surgeon: Cain, Brandon  Christopher, MD;  Location: [facility]ASIVE [facility];  Service: Cardiovascular;  Laterality: Bilateral;  . AMPUTATION Right 08/11/2019   Procedure: RIGHT ABOVE KNEE AMPUTATION;  Surgeon: Dickson, Christopher S, MD;  Location: [facility] OR;  Service: Vascular;  Laterality: Right;  . BASAL CELL CARCINOMA EXCISION Left    ear  . CARDIAC CATHETERIZATION     "prior to bypass"  . CATARACT EXTRACTION W/ INTRAOCULAR LENS  IMPLANT, BILATERAL Bilateral   . CORONARY ARTERY BYPASS GRAFT  10/2001   LIMA to LAD, SVG to OM1-2, SVG to RCA and PDA  . LITHOTRIPSY    . PROSTATE BIOPSY  Oct. 2014  . TONSILLECTOMY      Assessment & Plan Clinical Impression: 86 year old male with history of COPD- chronic hypoxic respiratory failure and 2 L oxygen dependent, BPH- foley for retention, PAF, CAD s/p CABG, PVD with chronic nonhealing wound on right foot and no options for revascularizations who was admitted on 08/11/19 for R-AKA by Dr. Dickson. Chronic diastolic heart failure being monitored and was placed on 1500cc FR. Pain control and weakness continued to be an issues. Had issues with orthostatic symptoms with attempts at sitting at EOB. Patient transferred to CIR on 08/14/2019 . Discharged back to acute on 8/11 secondary to hyponatremia and then re-admitted as interrupted stay back on CIR 8/12.   Patient currently requires mod with basic self-care skills secondary to muscle weakness and decreased cardiorespiratoy endurance and decreased oxygen   support.  Prior to hospitalization, patient could complete BADLs/IADLs with independence.  Patient will benefit from skilled intervention to decrease level of assist with basic self-care skills and increase independence with basic self-care skills prior to discharge home with care partner.  Anticipate patient will require 24 hour supervision and follow up home health.  OT - End of Session Endurance Deficit: Yes Endurance Deficit Description: pt required multiple extended  rest breaks during session   OT Evaluation Precautions/Restrictions  Restrictions Weight Bearing Restrictions: Yes RLE Weight Bearing: Non weight bearing  Home Living/Prior Functioning Home Living Family/patient expects to be discharged to:: Private residence Living Arrangements: Spouse/significant other Available Help at Discharge: Family, Available 24 hours/day Type of Home: House Home Access: Level entry Home Layout: Multi-level, Bed/bath upstairs Alternate Level Stairs-Number of Steps: Enters from carport with level entry. 9 to kitchen/dining room/living room. 6 additional steps to bedroom/bathroom with 3 bedrooms and 2 bathrooms with 1 rail with each set of stairs. Alternate Level Stairs-Rails: Right Bathroom Shower/Tub: Walk-in shower, Tub/shower unit Bathroom Toilet: Standard Bathroom Accessibility: Yes Additional Comments: O2 2L  Lives With: Spouse IADL History Current License: Yes Mode of Transportation: Car Occupation: Retired Type of Occupation: Public guardian deputy Leisure and Hobbies: Photography IADL Comments: Wife responsible for IADLs including homemaking and meal prep. Prior Function Level of Independence: Independent with basic ADLs, Independent with homemaking with ambulation, Requires assistive device for independence  Able to Take Stairs?: Yes Driving: Yes Vocation: Retired Vision Baseline Vision/History: Wears glasses Wears Glasses: At all times Patient Visual Report: No change from baseline Perception  Perception: Within Functional Limits Praxis Praxis: Intact Cognition Overall Cognitive Status: Within Functional Limits for tasks assessed Arousal/Alertness: Awake/alert Orientation Level: Person;Place;Situation Person: Oriented Place: Oriented Situation: Oriented Year: 2021 Month: August Day of Week: Correct Memory: Appears intact Immediate Memory Recall: Sock;Blue;Bed Memory Recall Sock: Without Cue Memory Recall Blue: Without  Cue Memory Recall Bed: Without Cue Awareness: Appears intact Problem Solving: Appears intact Safety/Judgment: Appears intact Comments: Cues for hand placement and walker manipulation. Sensation Sensation Light Touch: Impaired by gross assessment Proprioception: Impaired by gross assessment Additional Comments: decreased along incision and at L2 dermatome on R residual limb. Decreased sensation along L great toe. Coordination Gross Motor Movements are Fluid and Coordinated: No Fine Motor Movements are Fluid and Coordinated: Yes Coordination and Movement Description: grossly uncoordinated due to R AKA, decreased balance/postural control, generalized weakness, poor endurance, and fatigue Finger Nose Finger Test: WFL but slow Heel Shin Test: decreased on L LE unable to perform on R LE due to AKA Motor  Motor Motor: Abnormal postural alignment and control Motor - Skilled Clinical Observations: grossly uncoordinated due to R AKA, decreased balance/postural control, generalized weakness, poor endurance, and fatigue  Trunk/Postural Assessment  Cervical Assessment Cervical Assessment: Exceptions to WFL (rounded shoulders and forward head) Thoracic Assessment Thoracic Assessment: Exceptions to WFL (kyphosis) Lumbar Assessment Lumbar Assessment: Exceptions to WFL (posterior pelvic tilt) Postural Control Postural Control: Deficits on evaluation  Balance Balance Balance Assessed: Yes Static Sitting Balance Static Sitting - Balance Support: Bilateral upper extremity supported;Feet supported Static Sitting - Level of Assistance: 5: Stand by assistance (supervision) Dynamic Sitting Balance Dynamic Sitting - Balance Support: Bilateral upper extremity supported;Feet unsupported Dynamic Sitting - Level of Assistance: 5: Stand by assistance (supervision) Extremity/Trunk Assessment RUE Assessment RUE Assessment: Within Functional Limits LUE Assessment LUE Assessment: Within Functional  Limits  Care Tool See Care Tool Section for details of selfcare and mobility  Refer to Care Plan for Long Term   Goals   ADL ADL Eating: Set up Where Assessed-Eating: Bed level Upper Body Bathing: Minimal assistance Where Assessed-Upper Body Bathing: Bed level Lower Body Bathing: Moderate assistance Where Assessed-Lower Body Bathing: Bed level Upper Body Dressing: Minimal assistance Where Assessed-Upper Body Dressing: Bed level Lower Body Dressing: Moderate assistance;Minimal cueing Where Assessed-Lower Body Dressing: Bed level ADL Comments: Min A grossly UB BADLs, and Mod A grossly LB BADLs. Mobility  Bed Mobility Bed Mobility: Rolling Right;Rolling Left;Supine to Sit;Sit to Supine Rolling Right: Contact Guard/Touching assist Rolling Left: Contact Guard/Touching assist Supine to Sit: Contact Guard/Touching assist Sit to Supine: Contact Guard/Touching assist   Discharge Criteria: Patient will be discharged from OT if patient refuses treatment 3 consecutive times without medical reason, if treatment goals not met, if there is a change in medical status, if patient makes no progress towards goals or if patient is discharged from hospital.  The above assessment, treatment plan, treatment alternatives and goals were discussed and mutually agreed upon: by patient   Short Term Goals: Week 1:  OT Short Term Goal 1 (Week 1): Pt will complete LB bathing sit to stand with min assist. OT Short Term Goal 2 (Week 1): Pt will complete LB dressing sit to stand with mod assist for 2 consecutive sessions. OT Short Term Goal 3 (Week 1): Pt will complete squat pivot or stand pivot transfer ot the BSC/drop arm commode with min assist. OT Short Term Goal 4 (Week 1): Pt will complete tolet hygiene and clothing management sit to stand with mod assist.  Skilled Therapeutic Interventions/Progress Updates:    Pt worked on bathing and dressing sitting EOB.  Increased fatigue was stated at the  start of the session from earlier PT session being completed.  He was able to transfer to the EOB with min assist.  He then doffed all of his UB clothing with supervision.  Min instructional cueing for initiation of task, but then he was able to complete UB bathing with setup except for washing his back.  He also completed UB dressing at the same supervision level.  He was able to complete lateral leans side to side with min assist for removing his LB clothing as well as for LB bathing.  Increased fatigue noted with therapist having to assist with washing the LLE and for donning his gripper sock and pants over the LLE.  He transitioned back to supine for rolling and bridging with min assist to pull the items over his hips.  Finished session with pt resting in the bed.  Oxygen sats throughout session on 2Ls nasal cannula were 90-95% with HR at 90-104 during activity.  Therapy has been reduced to 15/7 to help accommodate his limited endurance.  Did not attempt sit to stand or transfer OOB secondary to fatigue level.    Therapy Documentation Precautions:  Precautions Precautions: Fall Precaution Comments: R AKA Restrictions Weight Bearing Restrictions: No RLE Weight Bearing: Non weight bearing Other Position/Activity Restrictions: On 2L O2  Pain: Pain Assessment Pain Scale: Faces Faces Pain Scale: Hurts a little bit Pain Type: Surgical pain Pain Location: Leg Pain Orientation: Right Pain Descriptors / Indicators: Discomfort Pain Onset: On-going Pain Intervention(s): Repositioned ADL: See Care Tool Section for some details of mobility and selfcare  Therapy/Group: Individual Therapy  , OTR/L 08/22/2019, 12:54 PM  

## 2019-08-22 NOTE — Progress Notes (Signed)
[facility] Individual Statement of Services  Patient Name:  [name]  Date:  08/22/2019  Welcome to the [facility].  Our goal is to provide you with an individualized program based on your diagnosis and situation, designed to meet your specific needs.  With this comprehensive rehabilitation program, you will be expected to participate in at least 3 hours of rehabilitation therapies Monday-Friday, with modified therapy programming on the weekends.  Your rehabilitation program will include the following services:  Physical Therapy (PT), Occupational Therapy (OT), Speech Therapy (ST), 24 hour per day rehabilitation nursing, Therapeutic Recreaction (TR), Neuropsychology, Care Coordinator, Rehabilitation Medicine, Nutrition Services, Pharmacy Services and Other  Weekly team conferences will be held on Wednesdays to discuss your progress.  Your Inpatient Rehabilitation Care Coordinator will talk with you frequently to get your input and to update you on team discussions.  Team conferences with you and your family in attendance may also be held.  Expected length of stay: 15-19 Days  Overall anticipated outcome: Supervision  Depending on your progress and recovery, your program may change. Your Inpatient Rehabilitation Care Coordinator will coordinate services and will keep you informed of any changes. Your Inpatient Rehabilitation Care Coordinator's name and contact numbers are listed  below.  The following services may also be recommended but are not provided by the [facility]:    [place] will be made to provide these services after discharge if needed.  Arrangements include referral to agencies that provide these services.  Your insurance has been verified to be:  Medicare  Your primary doctor is:  [name], MD  Pertinent information will  be shared with your doctor and your insurance company.  Inpatient Rehabilitation Care Coordinator:  Erlene Quan, [place] or [phone]  Information discussed with and copy given to patient by: Dyanne Iha, 08/22/2019, 12:20 PM

## 2019-08-22 NOTE — Plan of Care (Signed)
  Problem: Consults Goal: RH LIMB LOSS PATIENT EDUCATION Description: Description: See Patient Education module for eduction specifics. Outcome: Progressing Goal: Skin Care Protocol Initiated - if Braden Score 18 or less Description: If consults are not indicated, leave blank or document N/A Outcome: Progressing   Problem: RH BOWEL ELIMINATION Goal: RH STG MANAGE BOWEL WITH ASSISTANCE Description: STG Manage Bowel with min Assistance. Outcome: Progressing Goal: RH STG MANAGE BOWEL W/MEDICATION W/ASSISTANCE Description: STG Manage Bowel with Medication with min Assistance. Outcome: Progressing   Problem: RH BLADDER ELIMINATION Goal: RH STG MANAGE BLADDER WITH ASSISTANCE Description: STG Manage Bladder With min Assistance Outcome: Progressing Goal: RH STG MANAGE BLADDER WITH EQUIPMENT WITH ASSISTANCE Description: STG Manage Bladder With Equipment With min Assistance Outcome: Progressing   Problem: RH SKIN INTEGRITY Goal: RH STG SKIN FREE OF INFECTION/BREAKDOWN Description: Pt will be free of skin breakdown/infection with min assist prior to DC Outcome: Progressing Goal: RH STG MAINTAIN SKIN INTEGRITY WITH ASSISTANCE Description: STG Maintain Skin Integrity With min Assistance. Outcome: Progressing   Problem: RH SAFETY Goal: RH STG ADHERE TO SAFETY PRECAUTIONS W/ASSISTANCE/DEVICE Description: STG Adhere to Safety Precautions With cues/reminders Assistance/Device. Outcome: Progressing   Problem: RH PAIN MANAGEMENT Goal: RH STG PAIN MANAGED AT OR BELOW PT'S PAIN GOAL Description: Less than 4 on 0-10 scale  Outcome: Progressing

## 2019-08-22 NOTE — IPOC Note (Signed)
Please see previous IPOC, pt interrupted stay.

## 2019-08-23 ENCOUNTER — Inpatient Hospital Stay (HOSPITAL_COMMUNITY): Payer: Medicare Other | Admitting: Occupational Therapy

## 2019-08-23 ENCOUNTER — Inpatient Hospital Stay (HOSPITAL_COMMUNITY): Payer: Medicare Other

## 2019-08-23 DIAGNOSIS — Z89611 Acquired absence of right leg above knee: Secondary | ICD-10-CM | POA: Diagnosis not present

## 2019-08-23 LAB — SODIUM: Sodium: 133 mmol/L — ABNORMAL LOW (ref 135–145)

## 2019-08-23 MED ORDER — SPIRONOLACTONE 25 MG PO TABS
25.0000 mg | ORAL_TABLET | ORAL | Status: DC
  Administered 2019-08-24 – 2019-09-01 (×5): 25 mg via ORAL
  Filled 2019-08-23 (×6): qty 1

## 2019-08-23 MED ORDER — CALCIUM CARBONATE ANTACID 500 MG PO CHEW
1.0000 | CHEWABLE_TABLET | Freq: Two times a day (BID) | ORAL | Status: DC | PRN
  Administered 2019-08-24 – 2019-08-26 (×3): 200 mg via ORAL
  Filled 2019-08-23 (×3): qty 1

## 2019-08-23 MED ORDER — SPIRONOLACTONE 12.5 MG HALF TABLET
12.5000 mg | ORAL_TABLET | ORAL | Status: DC
  Administered 2019-08-23 – 2019-08-31 (×5): 12.5 mg via ORAL
  Filled 2019-08-23 (×5): qty 1

## 2019-08-23 MED ORDER — LIDOCAINE HCL URETHRAL/MUCOSAL 2 % EX GEL
1.0000 "application " | Freq: Once | CUTANEOUS | Status: AC
  Administered 2019-08-23: 1 via TOPICAL
  Filled 2019-08-23: qty 5

## 2019-08-23 NOTE — Progress Notes (Signed)
Occupational Therapy Session Note  Patient Details  Name: [name] MRN: [id number] Date of Birth: 1929-10-25  Today's Date: 08/23/2019 OT Individual Time: 1305-1405 OT Individual Time Calculation (min): 60 min   Short Term Goals: Week 1:  OT Short Term Goal 1 (Week 1): Pt will complete LB bathing sit to stand with min assist. OT Short Term Goal 2 (Week 1): Pt will complete LB dressing sit to stand with mod assist for 2 consecutive sessions. OT Short Term Goal 3 (Week 1): Pt will complete squat pivot or stand pivot transfer ot the BSC/drop arm commode with min assist. OT Short Term Goal 4 (Week 1): Pt will complete tolet hygiene and clothing management sit to stand with mod assist.  Skilled Therapeutic Interventions/Progress Updates:    Pt greeted semi-reclined in bed with spouse assisting with ordering lunch. Pt reported pain and fatigue, initially declining therapy. Pt's spouse assisted with inhaler and flonase per pt request due to congestion and headache. Pt eventually agreeable to sit EOB. Pt completed bed mobility with HOB elevated and min A. Pt tolerated sitting EOB for 15 minutes with Min/supervision. Pt declined any BADL tasks. OT able to talk pt into transferring to wc. Pt completed squat-pivot to R side with min A. Pt brought down to therapy gym and completed 3 minutes on UE Ergometer before reaching max fatigue. Pt requesting to return to room and get back in the bed. Min A squat-pivot back to bed. Pt left semi-reclined in bed with bed alarm on, call bell in reach, and spouse present.   Therapy Documentation Precautions:  Precautions Precautions: Fall Precaution Comments: R AKA Restrictions Weight Bearing Restrictions: Yes RLE Weight Bearing: Non weight bearing Other Position/Activity Restrictions: On 2L O2 General: General OT Amount of Missed Time: 15 Minutes Pain:  No pain number given, but reported pain in residual limb, buttocks, and sinus headache. Distraction, rest,  and repositioned for comfort.  Therapy/Group: Individual Therapy  Valma Cava 08/23/2019, 2:12 PM

## 2019-08-23 NOTE — Progress Notes (Signed)
[facility] PHYSICAL MEDICINE & REHABILITATION PROGRESS NOTE  Subjective/Complaints: Sleeping, but easily awakes. He drinks 4 cups of coffee at home and receives 1 here. Updated diet order with this as he is having tension headaches which could be related to caffeine withdrawal   ROS: Denies CP, N/V/D  Objective: Vital Signs: Blood pressure 97/71, pulse 96, temperature 97.7 F (36.5 C), temperature source Axillary, resp. rate 15, height 5\' 9"  (1.753 m), weight 56.2 kg, SpO2 98 %. No results found. Recent Labs    08/21/19 0624 08/22/19 0600  WBC 9.3 8.1  HGB 8.7* 8.4*  HCT 26.0* 26.1*  PLT 399 373   Recent Labs    08/22/19 1201 08/22/19 1201 08/22/19 1916 08/23/19 0702  NA 131*   < > 130* 133*  K 4.8  --  4.8  --   CL 93*  --  94*  --   CO2 29  --  27  --   GLUCOSE 127*  --  195*  --   BUN 13  --  17  --   CREATININE 0.87  --  0.95  --   CALCIUM 9.4  --  9.2  --    < > = values in this interval not displayed.    Physical Exam: BP 97/71   Pulse 96   Temp 97.7 F (36.5 C) (Axillary)   Resp 15   Ht 5\' 9"  (1.753 m)   Wt 56.2 kg   SpO2 98%   BMI 18.30 kg/m  General: Alert and oriented x 3, No apparent distress HEENT: Head is normocephalic, atraumatic, PERRLA, EOMI, sclera anicteric, oral mucosa pink and moist, dentition intact, ext ear canals clear,  Neck: Supple without JVD or lymphadenopathy Cardiovascular: No JVD. Regular rate, premature contractions. Respiratory: Normal effort.  No stridor. Decreased breath sounds.  +[place]. GI: Non-distended. BS+. Skin: Right AKA with dressing CDI Psych: Normal mood.  Normal behavior. Musc: Right AKA with edema and tenderness. Neuro: Alert Extreme HOH Motor: Bilateral upper extremities: 4/5 proximal distally  Left lower extremity: 3+/5 proximal distal, unchanged. Right lower extremity: Hip flexion, knee extension 3+/5 (some pain patient), unchanged  Assessment/Plan: 1. Functional deficits secondary to right AKA which  require 3+ hours per day of interdisciplinary therapy in a comprehensive inpatient rehab setting.  Physiatrist is providing close team supervision and 24 hour management of active medical problems listed below.  Physiatrist and rehab team continue to assess barriers to discharge/monitor patient progress toward functional and medical goals  Care Tool:  Bathing    Body parts bathed by patient: Right arm, Left arm, Chest, Abdomen, Front perineal area, Buttocks, Right upper leg, Left upper leg, Face   Body parts bathed by helper: Left lower leg Body parts n/a: Right lower leg   Bathing assist Assist Level: Minimal Assistance - Patient > 75%     Upper Body Dressing/Undressing Upper body dressing   What is the patient wearing?: Pull over shirt    Upper body assist Assist Level: Minimal Assistance - Patient > 75%    Lower Body Dressing/Undressing Lower body dressing      What is the patient wearing?: Pants     Lower body assist Assist for lower body dressing: Moderate Assistance - Patient 50 - 74%     Toileting Toileting    Toileting assist Assist for toileting: Moderate Assistance - Patient 50 - 74%     Transfers Chair/bed transfer  Transfers assist     Chair/bed transfer assist level: Minimal Assistance - Patient >  75% (lateral scoot)     Locomotion Ambulation   Ambulation assist              Walk 10 feet activity   Assist           Walk 50 feet activity   Assist           Walk 150 feet activity   Assist           Walk 10 feet on uneven surface  activity   Assist           Wheelchair     Assist               Wheelchair 50 feet with 2 turns activity    Assist            Wheelchair 150 feet activity     Assist            Medical Problem List and Plan: 1.  Impaired mobility and ADLs secondary to right AKA with significant medical comorbidities  Continue CIR, 15/7 2.   Antithrombotics: -DVT/anticoagulation:  Pharmaceutical: Heparin             -antiplatelet therapy: Aspirin 81mg  daily 3. Pain Management: Has residual limb pain--educate on desensitization techniques.   Norco will be discontinued. Continue tramadol   Lidoderm patch added for left heel on 8/9  Has headaches- could be secondary to caffeine withdrawal: edited diet order to include coffee with caffeine.   Monitor with increased exertion. 4. Mood: Appears depressed--team to provide ego support. LCSW to follow for evaluation and support.   Will hold off on antidepressant given sodium levels. Discussed with wife  Wife may spend the night to assist with patient compliance and anxiety.             -antipsychotic agents: N/A 5. Neuropsych: This patient may be capable of making decisions on his own behalf. Intermittently disoriented.  6. Skin/Wound Care:  Monitor wound for healing.    Stump shrinker for edema control   Sacral injury: reposition frequently 7. Fluids/Electrolytes/Nutrition: Monitor I/Os 8.COPD/chronic hypoxic respiratory failure:   Continues to require supplemental oxygen (baseline)  Chest x-ray personally reviewed, mild effusion  ECG personally reviewed, showing premature complexes  Appreciate Pulm recs  Will ask pharm to educate  Need to continue to stress compliance 9. BPH/renal calculi:   Continue foley  Continue finasteride.  10. Anxiety d/o: continue Xanax tid prn. 11. Hyponatremia:   Sodium 133 on 8/14, labs per Nephro  Appreciate Nephro recs  See #15 12. ABLA:   Hemoglobin 8.4 on 8/13, labs ordered for Monday 13. Nasal congestion: Afrin nasal spray X 3 days.  Added saline spray as well as humidification.   14. HTN: Has history of orthostatic hypotension.  Monitor BP   Continue aldactone 12.5/25 mgQOD and Toprol XL.   Lasix restarted 20mg  daily as per nephrology  Controlled on 8/13  Monitor with increased exertion 15. CAD/Chronic systolic CHF: Monitor for symptoms  with increase in activity. On aldactone, toprol XL, ASA  and Lipitor Filed Weights   08/21/19 1430 08/22/19 0312 08/23/19 0500  Weight: 59.3 kg 53.5 kg 56.2 kg   Lasix d/ced per Nephro  ?Reliability on 8/13 16. Transaminitis: Resolved  LFTs WNL on 8/13  Continue to monitor 17.  Severe hypoalbuminemia  Supplement initiated  18.  Chronic sinusitis according to patient's wife has been seen at [facility] who recommended a 67-month course of azithromycin.    Azithromycin at 250 mg/day.  Has Afrin spray as well.   >35 minutes spent in discussion with patient and wife regarding sacral pain, application of lidocaine jelly around the injury and the importance of repositioning; discussion of bilateral frontal headache, patient takes 4 cups of caffeine at home and one daily here and may be having withdrawal headaches- edited diet order; wife asks about fluid pillls- aldactone not ordered but indicated to continue in note (ordered), Lasix 20mg  restarted by nephro; discussed phantom limb and residual limb pain; poor sleep at night and excessive sleep in day (wife did not stay with him last night)  LOS: 2 days A FACE TO FACE EVALUATION WAS PERFORMED  [name] 08/23/2019, 12:18 PM

## 2019-08-23 NOTE — Progress Notes (Signed)
Physical Therapy Session Note  Patient Details  Name: [name] MRN: [id number] Date of Birth: October 29, 1929  Today's Date: 08/23/2019 PT Individual Time: 0815-0900 PT Individual Time Calculation (min): 45 min   Short Term Goals: Week 1:  PT Short Term Goal 1 (Week 1): pt will perform bed mobility with supervision PT Short Term Goal 2 (Week 1): Pt will transfer sit<>stand with LRAD min A PT Short Term Goal 3 (Week 1): Pt will perform simulated car transfer with LRAD mod A  Skilled Therapeutic Interventions/Progress Updates:  Treatment started late as Nsg in with pt attempting to give pt meds, some of which he refused due to fatigue. He denied pain.   Pt sitting up in bed, c/o fatigue, indigestion.  Pt noted to have moderate pitting edema L foot; [place], RN will request order for TED. Pt required prolonged rest breaks between sets of exs.  Expiratory wheeze noted.  Pt eventually agreed to perform strengthening exs in long sitting: 10 x 1 each: L ankle circles, L resisted ankle pumps, L/R shoulder flexion punches, R hip abduction/adduction.  Pt took meds during session, and coughed on water after using inhaler.  PT assisted pt in sitting more upright.  At end of session, pt resting in bed with alarm set, LLE boot donned, and wife present.     Therapy Documentation Precautions:  Precautions Precautions: Fall Precaution Comments: R AKA Restrictions Weight Bearing Restrictions: Yes RLE Weight Bearing: Non weight bearing Other Position/Activity Restrictions: On 2L O2       Therapy/Group: Individual Therapy  Zaylan Kissoon 08/23/2019, 9:20 AM

## 2019-08-23 NOTE — Progress Notes (Signed)
[facility] Progress Note   86y.o.maleCOPD oxygen dependent, BPH with foley for retention, PAF, HTN, CASHD s/p CAVG, PAD with chronic revascularizations leading to a Rt AKA by Dr. Scot Dock. We were consulted for hyponatremia. Na was 136 on 8/2 and since then has been steadily decreasing with normal renal function. Lexapro was last given on day of consultation but was discontinued. Pt was also treated with Lasix 20mg  twice daily Since 8/5. He has had very mild nausea only in the am but has phantom pain present.  Assessment/ Plan:   1. Hyponatremia - appears to be hypovolemic on PE and also with increasing HCO3 possibly representing contraction alkalosis. Nausea has been very mild, has sinus drainage, and pain as well which can all lead to ADH release but I am concerned that he may be volume down which can also cause hyponatremia. -Na continued to dropdespite trialwith isotonic hydration + holdingdiuretics. Concerned this may beSIADH asNa  dropped more with isotonic fluids. - Urine studiesNa 25, osmolalityurine 369 blood 259, TSH(12.16), cortisol-> T3 low  Did not req the 3% and Na starting to rise with tolvaptan.  2nd dose of Tolvaptan on 8/12.  Will hold off on Tolvaptan for now. Lasix 20mg  daily and continue fluid restriction (1.8L)  - Possible offending agents have already been discontinued; main culprit was likely perioperative period. Has not required any further Tolvaptan and appears to be stabilizing.  Update son Randall Hiss [phone] last yesterday.  Will sign off at this time; please reconsult as needed.  2. PAD s/p rt AKA 3. COPD on O2 and breathing treatments. 4. CASHD 5. CHF (diastolic) 6. HTN 7. CKD3 (BL1-1.1)  Subjective:   Dyspneic but not worse than baseline; main complaint is indigestion which started before he ate Denies n/v   Objective:   BP 97/71   Pulse 96   Temp 97.7 F (36.5 C) (Axillary)   Resp 15   Ht 5\' 9"  (1.753 m)    Wt 56.2 kg   SpO2 98%   BMI 18.30 kg/m   Intake/Output Summary (Last 24 hours) at 08/23/2019 1056 Last data filed at 08/23/2019 0800 Gross per 24 hour  Intake 450 ml  Output 1375 ml  Net -925 ml   Weight change: -3.1 kg  Physical Exam: GEN: NAD, A&Ox3,HOH HEENT: No conjunctival pallor, EOMI NECK: Supple, no thyromegaly LUNGS: CTA B/L no rales CV: RRR, No M/R/G ABD: SNDNT +BS  RXV:QMGQQ lower extremity edema  Imaging: No results found.  Labs: BMET Recent Labs  Lab 08/21/19 0050 08/21/19 0050 08/21/19 0624 08/21/19 1057 08/21/19 1625 08/22/19 0216 08/22/19 0600 08/22/19 0857 08/22/19 1201 08/22/19 1916 08/23/19 0702  NA 125*   < > 125*   < > 127* 132* 134* 132* 131* 130* 133*  K 4.3  --  4.1  --  4.3 4.4 4.1  --  4.8 4.8  --   CL 87*  --  88*  --  89* 93* 94*  --  93* 94*  --   CO2 30  --  30  --  31 31 30   --  29 27  --   GLUCOSE 87  --  90  --  134* 92 94  --  127* 195*  --   BUN 15  --  14  --  14 14 13   --  13 17  --   CREATININE 0.81  --  0.84  --  0.95 0.87 0.85  --  0.87 0.95  --   CALCIUM 8.7*  --  8.6*  --  8.8* 9.4 9.4  --  9.4 9.2  --    < > = values in this interval not displayed.   CBC Recent Labs  Lab 08/18/19 0438 08/21/19 0624 08/22/19 0600  WBC 8.5 9.3 8.1  NEUTROABS  --   --  6.2  HGB 9.0* 8.7* 8.4*  HCT 27.2* 26.0* 26.1*  MCV 95.1 95.6 97.4  PLT 322 399 373    Medications:    . (feeding supplement) PROSource Plus  30 mL Oral BID BM  . aspirin EC  81 mg Oral Daily  . atorvastatin  20 mg Oral QHS  . azithromycin  250 mg Oral Daily  . budesonide-formoterol  2 puff Inhalation BID  . Chlorhexidine Gluconate Cloth  6 each Topical Daily  . enoxaparin (LOVENOX) injection  40 mg Subcutaneous Q24H  . famotidine  20 mg Oral QHS  . feeding supplement (ENSURE ENLIVE)  237 mL Oral TID BM  . finasteride  5 mg Oral Q lunch  . fluticasone  2 spray Each Nare Daily  . furosemide  20 mg Oral Daily  . hydrocerin   Topical BID  . lidocaine  1  patch Transdermal Daily  . metoprolol succinate  12.5 mg Oral Daily  . multivitamin with minerals  1 tablet Oral Daily  . predniSONE  10 mg Oral Q breakfast  . sodium chloride  1 drop Both Eyes QID  . Tiotropium Bromide Monohydrate  2 puff Inhalation Daily  . vitamin B-12  1,000 mcg Oral Daily      Travis Santee, MD 08/23/2019, 10:56 AM

## 2019-08-24 ENCOUNTER — Inpatient Hospital Stay (HOSPITAL_COMMUNITY): Payer: Medicare Other

## 2019-08-24 DIAGNOSIS — Z89611 Acquired absence of right leg above knee: Secondary | ICD-10-CM | POA: Diagnosis not present

## 2019-08-24 LAB — SODIUM: Sodium: 132 mmol/L — ABNORMAL LOW (ref 135–145)

## 2019-08-24 MED ORDER — PANTOPRAZOLE SODIUM 20 MG PO TBEC
20.0000 mg | DELAYED_RELEASE_TABLET | Freq: Every day | ORAL | Status: DC
  Administered 2019-08-24 – 2019-09-01 (×9): 20 mg via ORAL
  Filled 2019-08-24 (×9): qty 1

## 2019-08-24 MED ORDER — ALPRAZOLAM 0.25 MG PO TABS
0.2500 mg | ORAL_TABLET | Freq: Three times a day (TID) | ORAL | Status: DC | PRN
  Administered 2019-08-24 – 2019-08-29 (×11): 0.25 mg via ORAL
  Filled 2019-08-24 (×13): qty 1

## 2019-08-24 NOTE — Progress Notes (Signed)
[place] PHYSICAL MEDICINE & REHABILITATION PROGRESS NOTE  Subjective/Complaints: Feels and looks better after getting caffeine more regularly. Continues to have sinusitis in bilateral frontal region. Was told my his cardiologist that he should not take oral decongestants- continue sprays for now and advised warm compress.  ROS: Denies CP, N/V/D  Objective: Vital Signs: Blood pressure 129/74, pulse 88, temperature 98.4 F (36.9 C), temperature source Oral, resp. rate 16, height 5\' 9"  (1.753 m), weight 56.9 kg, SpO2 97 %. No results found. Recent Labs    08/22/19 0600  WBC 8.1  HGB 8.4*  HCT 26.1*  PLT 373   Recent Labs    08/22/19 1201 08/22/19 1201 08/22/19 1916 08/22/19 1916 08/23/19 0702 08/24/19 0446  NA 131*   < > 130*   < > 133* 132*  K 4.8  --  4.8  --   --   --   CL 93*  --  94*  --   --   --   CO2 29  --  27  --   --   --   GLUCOSE 127*  --  195*  --   --   --   BUN 13  --  17  --   --   --   CREATININE 0.87  --  0.95  --   --   --   CALCIUM 9.4  --  9.2  --   --   --    < > = values in this interval not displayed.    Physical Exam: BP 129/74 (BP Location: Right Arm)   Pulse 88   Temp 98.4 F (36.9 C) (Oral)   Resp 16   Ht 5\' 9"  (1.753 m)   Wt 56.9 kg   SpO2 97%   BMI 18.52 kg/m  General: Alert and oriented x 3, No apparent distress HEENT: Head is normocephalic, atraumatic, PERRLA, EOMI, sclera anicteric, oral mucosa pink and moist, dentition intact, ext ear canals clear,  Neck: Supple without JVD or lymphadenopathy Cardiovascular: No JVD. Regular rate, premature contractions. Respiratory: Normal effort.  No stridor. Decreased breath sounds.  +[place]. GI: Non-distended. BS+. Skin: Right AKA with dressing CDI Psych: Normal mood.  Normal behavior. Musc: Right AKA with edema and tenderness. Neuro: Alert Extreme HOH Motor: Bilateral upper extremities: 4/5 proximal distally  Left lower extremity: 3+/5 proximal distal, unchanged. Right lower extremity:  Hip flexion, knee extension 3+/5 (some pain patient), unchanged  Assessment/Plan: 1. Functional deficits secondary to right AKA which require 3+ hours per day of interdisciplinary therapy in a comprehensive inpatient rehab setting.  Physiatrist is providing close team supervision and 24 hour management of active medical problems listed below.  Physiatrist and rehab team continue to assess barriers to discharge/monitor patient progress toward functional and medical goals  Care Tool:  Bathing    Body parts bathed by patient: Right arm, Left arm, Chest, Abdomen, Front perineal area, Face (pr declined LB bathing)   Body parts bathed by helper: Left lower leg Body parts n/a: Buttocks, Right upper leg, Left upper leg, Right lower leg, Left lower leg   Bathing assist Assist Level: Minimal Assistance - Patient > 75%     Upper Body Dressing/Undressing Upper body dressing   What is the patient wearing?: Pull over shirt    Upper body assist Assist Level: Supervision/Verbal cueing    Lower Body Dressing/Undressing Lower body dressing      What is the patient wearing?: Underwear/pull up, Pants     Lower body  assist Assist for lower body dressing: Moderate Assistance - Patient 50 - 74%     Toileting Toileting    Toileting assist Assist for toileting: Moderate Assistance - Patient 50 - 74%     Transfers Chair/bed transfer  Transfers assist     Chair/bed transfer assist level: Minimal Assistance - Patient > 75%     Locomotion Ambulation   Ambulation assist              Walk 10 feet activity   Assist           Walk 50 feet activity   Assist           Walk 150 feet activity   Assist           Walk 10 feet on uneven surface  activity   Assist           Wheelchair     Assist               Wheelchair 50 feet with 2 turns activity    Assist            Wheelchair 150 feet activity     Assist             Medical Problem List and Plan: 1.  Impaired mobility and ADLs secondary to right AKA with significant medical comorbidities  Continue CIR, 15/7 2.  Antithrombotics: -DVT/anticoagulation:  Pharmaceutical: Heparin             -antiplatelet therapy: Aspirin 81mg  daily 3. Pain Management: Has residual limb pain--educate on desensitization techniques.   Norco will be discontinued. Continue tramadol   Lidoderm patch added for left heel on 8/9  Has headaches- could be secondary to caffeine withdrawal: edited diet order to include coffee with caffeine. This has helped. Apply warm compress 15 minutes three times per day (ordered)  Monitor with increased exertion. 4. Mood: Appears depressed--team to provide ego support. LCSW to follow for evaluation and support.   Will hold off on antidepressant given sodium levels. Discussed with wife  Wife may spend the night to assist with patient compliance and anxiety.  Increased Xanax to .25mg  TID for anxiety             -antipsychotic agents: N/A 5. Neuropsych: This patient may be capable of making decisions on his own behalf. Intermittently disoriented.  6. Skin/Wound Care:  Monitor wound for healing.    Stump shrinker for edema control   Sacral injury: reposition frequently 7. Fluids/Electrolytes/Nutrition: Monitor I/Os 8.COPD/chronic hypoxic respiratory failure:   Continues to require supplemental oxygen (baseline)  Chest x-ray personally reviewed, mild effusion  ECG personally reviewed, showing premature complexes  Appreciate Pulm recs  Will ask pharm to educate  Need to continue to stress compliance 9. BPH/renal calculi:   Continue foley  Continue finasteride.  10. Anxiety d/o: continue Xanax tid prn. 11. Hyponatremia:   Sodium 133 on 8/14, 132 on 8/15- liberalized diet to regular, maintain fluid restriction  Appreciate Nephro recs  See #15 12. ABLA:   Hemoglobin 8.4 on 8/13, labs ordered for Monday 13. Nasal congestion: Afrin nasal  spray X 3 days.  Added saline spray as well as humidification.   14. HTN: Has history of orthostatic hypotension.  Monitor BP   Continue aldactone 12.5/25 mgQOD and Toprol XL.   Lasix restarted 20mg  daily as per nephrology  Controlled on 8/13  Monitor with increased exertion 15. CAD/Chronic systolic CHF: Monitor for symptoms with increase in activity. On  aldactone, toprol XL, ASA  and Lipitor Filed Weights   08/22/19 0312 08/23/19 0500 08/24/19 0500  Weight: 53.5 kg 56.2 kg 56.9 kg   Lasix d/ced per Nephro  ?Reliability on 8/13 16. Transaminitis: Resolved  LFTs WNL on 8/13  Continue to monitor 17.  Severe hypoalbuminemia  Supplement initiated  18.  Chronic sinusitis according to patient's wife has been seen at [facility] who recommended a 74-month course of azithromycin.    Azithromycin at 250 mg/day. Has Afrin spray as well.     LOS: 3 days A FACE TO FACE EVALUATION WAS PERFORMED  Clide Deutscher Finch Costanzo 08/24/2019, 3:59 PM

## 2019-08-24 NOTE — Progress Notes (Addendum)
Physical Therapy Session Note  Patient Details  Name: [name] MRN: [id number] Date of Birth: 1929-09-01  Today's Date: 08/24/2019 PT Individual Time: 1355-1510 PT Individual Time Calculation (min): 75 min   Short Term Goals: Week 1:  PT Short Term Goal 1 (Week 1): pt will perform bed mobility with supervision PT Short Term Goal 2 (Week 1): Pt will transfer sit<>stand with LRAD min A PT Short Term Goal 3 (Week 1): Pt will perform simulated car transfer with LRAD mod A  Skilled Therapeutic Interventions/Progress Updates:     Patient in bed with his wife at bedside upon PT arrival. Patient alert and agreeable to PT session. Patient reported intermittent R residual limb pain and 5/10 head pain/pressure from sinuses during session, RN made aware. PT provided repositioning, rest breaks, and distraction as pain interventions throughout session.   Patient is very [name], donned hearing assist headset during session for improved communication.  Patient and his wife had several questions about patient's current medical status and recovery. PT provided general education about amputee recovery and factors that affect recovery, such as age, nutrition, additional pressure sores/skin breakdown, activity tolerance, and pain management. Discussed patient's current L heel wound and sacral wound with RN and the patient and his wife. Per RN, both wounds are superficial and require only prophylactic foam dressings at this time to prevent/reduce pressure. Educated patient and his wife on this to reinforce nursing education. Also, educated on use of PRAFO and purpose/function for off-loading L heel. Informed patient he could perform basic transfer with the [facility] as well to maintain off-loading of the heel during transfers. Patient with PRAFO doffed and 2 pillows under his foot at this time. He asked if it was okay to leave the [facility] off and elevate his heel instead. Educated on floating the heel to reduce pressure and  demonstrated positioning. Patient very resistant to using the [facility] at this time despite education.   Patient removed his pants to allow PT to assess his residual limb. Required max-total A to don/doff pants bed level. Provided cues for bridging with L LE to lift hips and rolling technique when fatigued. Required supervision for cues for both techniques. Noted patient's shrinker donned, but slid down with excess space at the distal end. Removed shrinker to assess residual limb. Incision heeling well and educated patient on heeling and signs/symptoms of infection. Patient with significant bruising of posterior limb, consistent with bruising on arms and abdomen, RN aware. Patient unable to look at his residual limb at this time due to poor acceptance and coping strategies with limb loss. Educated patient on coping strategies, recovery from limb loss, and desensitization techniques. Patient is capable and states that he touches his residual limb throughout the day. Educated on massage and tactile stimulation to include visual inspection of the limb. Patient receptive to and in agreement with all education, however, continued to avoid looking at his limb throughout.   Reinforced nursing education for allowing repositioning in bed and calling for assist for repositioning as needed for comfort and improved skin integrity. Patient stated that he does not tolerate sitting in the w/c for increased time. Will discuss alternate cushion options with lead therapist for improved patient comfort. Patient declined all mobility, except sat-up to long sitting x1 to assist with donning shrinker. Noted shrinker continues to have some excess space at the distal end, will discuss ordering smaller shrinkers with team tomorrow.     Patient in bed with his wife at bedside at end of  session with breaks locked, bed alarm set, and all needs within reach.    Therapy Documentation Precautions:  Precautions Precautions:  Fall Precaution Comments: R AKA Restrictions Weight Bearing Restrictions: Yes RLE Weight Bearing: Non weight bearing Other Position/Activity Restrictions: On 2L O2   Therapy/Group: Individual Therapy  Annalea Alguire L Indra Wolters PT, DPT  08/24/2019, 4:45 PM

## 2019-08-24 NOTE — Progress Notes (Signed)
Patient not ready for CPAP at this time. RT will follow up at a later time.

## 2019-08-24 NOTE — Progress Notes (Signed)
Patient resting comfortably on home CPAP at this time. No assistance needed from RT.

## 2019-08-24 NOTE — Progress Notes (Signed)
Occupational Therapy Session Note  Patient Details  Name: [name] MRN: [id number] Date of Birth: 23-Feb-1929  Today's Date: 08/24/2019 OT Individual Time: 0800-0900 OT Individual Time Calculation (min): 60 min    Short Term Goals: Week 1:  OT Short Term Goal 1 (Week 1): Pt will complete LB bathing sit to stand with min assist. OT Short Term Goal 2 (Week 1): Pt will complete LB dressing sit to stand with mod assist for 2 consecutive sessions. OT Short Term Goal 3 (Week 1): Pt will complete squat pivot or stand pivot transfer ot the BSC/drop arm commode with min assist. OT Short Term Goal 4 (Week 1): Pt will complete tolet hygiene and clothing management sit to stand with mod assist.  Skilled Therapeutic Interventions/Progress Updates:    Pt asleep in bed upon arrival but easily aroused.  Pt apologetic for missing 8 am appointment.  Explained to pt that this was his therapy.  Pt perseverated on missing 8 am appointment and explaining that he normally awoke at 6 am.  Pt discussed recurring dream of going to kitchen in his bed. Pt also stated he has recently been having nightmares but unable to elaborate. Pt initially declined changing clothing until he ate breakfast and was curious why he wasn't wakened when breakfast arrived.  Wife arrived and encouraged to sit EOB to change clothing. Pt removed shirt and was UB with supervision while seated EOB. Pt declined LB bathing. Pt required mod A for LB dressing tasks, primarily to pull over hips. Pt performed lateral leans to partially pull pants over hips but returned to supine and bridged to facilitate therapist assisting with task.  Pt fatigues quickly and requires multiple rest breaks during activities. O2 sats>90% on 2L O2. Pt returned to sitting EOB to eat breakfast with wife present. Bed alarm activated.   Therapy Documentation Precautions:  Precautions Precautions: Fall Precaution Comments: R AKA Restrictions Weight Bearing Restrictions:  Yes RLE Weight Bearing: Non weight bearing Other Position/Activity Restrictions: On 2L O2 Pain: Pain Assessment Pain Scale: 0-10 Pain Score: 5  Pain Type: Acute pain Pain Location: Back Pain Orientation: Lower (bil shoulder pain, foot pain) Pain Radiating Towards: sacrum Pain Descriptors / Indicators: Burning;Aching Pain Onset: On-going Patients Stated Pain Goal: 4 Pain Intervention(s): Meds admin by RN and repositioned    Therapy/Group: Individual Therapy  [name] 08/24/2019, 11:58 AM

## 2019-08-25 ENCOUNTER — Ambulatory Visit: Payer: Self-pay | Admitting: *Deleted

## 2019-08-25 ENCOUNTER — Inpatient Hospital Stay (HOSPITAL_COMMUNITY): Payer: Medicare Other

## 2019-08-25 ENCOUNTER — Inpatient Hospital Stay (HOSPITAL_COMMUNITY): Payer: Medicare Other | Admitting: Occupational Therapy

## 2019-08-25 DIAGNOSIS — Z89611 Acquired absence of right leg above knee: Secondary | ICD-10-CM | POA: Diagnosis not present

## 2019-08-25 LAB — CBC
HCT: 26.5 % — ABNORMAL LOW (ref 39.0–52.0)
Hemoglobin: 8.5 g/dL — ABNORMAL LOW (ref 13.0–17.0)
MCH: 32.1 pg (ref 26.0–34.0)
MCHC: 32.1 g/dL (ref 30.0–36.0)
MCV: 100 fL (ref 80.0–100.0)
Platelets: 398 10*3/uL (ref 150–400)
RBC: 2.65 MIL/uL — ABNORMAL LOW (ref 4.22–5.81)
RDW: 13.4 % (ref 11.5–15.5)
WBC: 8.8 10*3/uL (ref 4.0–10.5)
nRBC: 0 % (ref 0.0–0.2)

## 2019-08-25 LAB — BASIC METABOLIC PANEL
Anion gap: 7 (ref 5–15)
BUN: 20 mg/dL (ref 8–23)
CO2: 31 mmol/L (ref 22–32)
Calcium: 8.8 mg/dL — ABNORMAL LOW (ref 8.9–10.3)
Chloride: 91 mmol/L — ABNORMAL LOW (ref 98–111)
Creatinine, Ser: 0.88 mg/dL (ref 0.61–1.24)
GFR calc Af Amer: >60 mL/min (ref >60)
GFR calc non Af Amer: >60 mL/min (ref >60)
Glucose, Bld: 89 mg/dL (ref 70–99)
Potassium: 4.2 mmol/L (ref 3.5–5.1)
Sodium: 129 mmol/L — ABNORMAL LOW (ref 135–145)

## 2019-08-25 LAB — SODIUM: Sodium: 130 mmol/L — ABNORMAL LOW (ref 135–145)

## 2019-08-25 MED ORDER — VITAMIN B-12 1000 MCG PO TABS
1000.0000 ug | ORAL_TABLET | Freq: Every day | ORAL | Status: DC
  Administered 2019-08-26 – 2019-08-31 (×6): 1000 ug via ORAL
  Filled 2019-08-25 (×7): qty 1

## 2019-08-25 MED ORDER — POLYETHYLENE GLYCOL 3350 17 G PO PACK
17.0000 g | PACK | Freq: Every day | ORAL | Status: DC
  Administered 2019-08-26 – 2019-08-30 (×4): 17 g via ORAL
  Filled 2019-08-25 (×7): qty 1

## 2019-08-25 MED ORDER — MELATONIN 3 MG PO TABS
3.0000 mg | ORAL_TABLET | Freq: Every day | ORAL | Status: DC
  Administered 2019-08-25 – 2019-08-31 (×7): 3 mg via ORAL
  Filled 2019-08-25 (×7): qty 1

## 2019-08-25 MED ORDER — ASPIRIN EC 81 MG PO TBEC
81.0000 mg | DELAYED_RELEASE_TABLET | Freq: Every day | ORAL | Status: DC
  Administered 2019-08-26 – 2019-08-31 (×6): 81 mg via ORAL
  Filled 2019-08-25 (×8): qty 1

## 2019-08-25 MED ORDER — LEVOTHYROXINE SODIUM 25 MCG PO TABS
25.0000 ug | ORAL_TABLET | Freq: Every day | ORAL | Status: DC
  Administered 2019-08-27 – 2019-09-01 (×6): 25 ug via ORAL
  Filled 2019-08-25 (×9): qty 1

## 2019-08-25 MED ORDER — LIDOCAINE HCL URETHRAL/MUCOSAL 2 % EX GEL
1.0000 "application " | Freq: Two times a day (BID) | CUTANEOUS | Status: DC
  Administered 2019-08-25 – 2019-08-31 (×10): 1 via TOPICAL
  Filled 2019-08-25 (×4): qty 5

## 2019-08-25 MED ORDER — ADULT MULTIVITAMIN W/MINERALS CH
1.0000 | ORAL_TABLET | Freq: Every day | ORAL | Status: DC
  Administered 2019-08-26 – 2019-08-31 (×6): 1 via ORAL
  Filled 2019-08-25 (×7): qty 1

## 2019-08-25 NOTE — Progress Notes (Signed)
Occupational Therapy Session Note  Patient Details  Name: [name] MRN: [id number] Date of Birth: 08-17-1929  Today's Date: 08/25/2019 OT Individual Time: 2202-5427 OT Individual Time Calculation (min): 40 min    Short Term Goals: Week 1:  OT Short Term Goal 1 (Week 1): Pt will complete LB bathing sit to stand with min assist. OT Short Term Goal 2 (Week 1): Pt will complete LB dressing sit to stand with mod assist for 2 consecutive sessions. OT Short Term Goal 3 (Week 1): Pt will complete squat pivot or stand pivot transfer ot the BSC/drop arm commode with min assist. OT Short Term Goal 4 (Week 1): Pt will complete tolet hygiene and clothing management sit to stand with mod assist.  Skilled Therapeutic Interventions/Progress Updates:    Treatment session with focus on activity tolerance and self-care retraining.  Pt received dozing in bed, but easily aroused.  Pt attempting to refuse therapy session stating that he was too tired and didn't feel like he could participate in any activity at this time.  Engaged in in therapeutic use of self and listening to pt concerns, able to encourage pt to go to sink to complete grooming tasks. Pt completed bed mobility with CGA and squat pivot transfer to w/c with mod assist due to decreased anterior weight shift.  Therapist provided mod multimodal cues for weight shift and clearance of buttocks during transfer.  Pt completed oral care and brushing hair seated at sink level with setup for items.  Educated pt on activity tolerance and increased OOB tolerance, pt ultimately agreeable to remaining OOB until next therapy session.  Therapy Documentation Precautions:  Precautions Precautions: Fall Precaution Comments: R AKA Restrictions Weight Bearing Restrictions: Yes RLE Weight Bearing: Non weight bearing Other Position/Activity Restrictions: On 2L O2 General:   Vital Signs: Therapy Vitals Temp: 98.5 F (36.9 C) Pulse Rate: 90 Resp: 16 BP:  122/67 Patient Position (if appropriate): Sitting Oxygen Therapy SpO2: 98 % O2 Device: Nasal Cannula Pain:  Pt with no c/o pain   Therapy/Group: Individual Therapy  [name] 08/25/2019, 3:44 PM

## 2019-08-25 NOTE — Progress Notes (Signed)
Patient resting comfortably on home BIPAP unit. No assistance needed from RT at this time.

## 2019-08-25 NOTE — Progress Notes (Signed)
Pt continues to request "lidocaine gel" for his bottom. This nurse checked the MAR, no active order for "lidocaine gel". Pt was educated on the active orders at this time. Pt proceeds to raise his voice and use profanity towards this nurse and tanya, NT. Pt appears up set because this nurse is refusing to apply lidocaine gel to his buttocks. Pt states " well what the [facility] are you good for if you can not do that. What can you do?" This nurse and Tanya, NT has offered to do Q2h turns on the pt and place pillows under the pts bottom to help keep the pressure off of his buttocks. Pt continues to use profanity and states " I just do not see why you can not put the damn gel on my bottom, I am miserable and you expect me to just lay here." Pt was offered different positioning while in the bed but continues to refuse. Tanya, NT asks the pt to get the morning vitals, pt yells " I dont give a damn what you do?".

## 2019-08-25 NOTE — Progress Notes (Signed)
   VASCULAR SURGERY ASSESSMENT & PLAN:   POD 14 RIGHT AKA: His right AKA is healing nicely.  His staples will stay in for 1 month.  I have arranged follow-up in our office in 2 weeks from now for staple removal.   SUBJECTIVE:   No specific complaints.  PHYSICAL EXAM:   Vitals:   08/24/19 0904 08/24/19 1459 08/24/19 2020 08/25/19 0337  BP: 135/76 129/74 121/69 130/82  Pulse: 97 88 94 97  Resp:  16 16 17   Temp:   97.7 F (36.5 C) 97.9 F (36.6 C)  TempSrc:   Oral Oral  SpO2: 98% 97% 98% 97%  Weight:      Height:       His right AKA is healing nicely.  LABS:   Lab Results  Component Value Date   WBC 8.8 08/25/2019   HGB 8.5 (L) 08/25/2019   HCT 26.5 (L) 08/25/2019   MCV 100.0 08/25/2019   PLT 398 08/25/2019   Lab Results  Component Value Date   CREATININE 0.88 08/25/2019   Lab Results  Component Value Date   INR 1.0 08/11/2019   CBG (last 3)  No results for input(s): GLUCAP in the last 72 hours.  PROBLEM LIST:    Active Problems:   S/P AKA (above knee amputation) (HCC)   Post-operative pain   Chronic systolic congestive heart failure ([place])   CURRENT MEDS:   . (feeding supplement) PROSource Plus  30 mL Oral BID BM  . aspirin EC  81 mg Oral QPC lunch  . atorvastatin  20 mg Oral QHS  . azithromycin  250 mg Oral Daily  . budesonide-formoterol  2 puff Inhalation BID  . Chlorhexidine Gluconate Cloth  6 each Topical Daily  . enoxaparin (LOVENOX) injection  40 mg Subcutaneous Q24H  . feeding supplement (ENSURE ENLIVE)  237 mL Oral TID BM  . finasteride  5 mg Oral Q lunch  . fluticasone  2 spray Each Nare Daily  . furosemide  20 mg Oral Daily  . hydrocerin   Topical BID  . lidocaine  1 patch Transdermal Daily  . lidocaine  1 application Topical [id number]  . melatonin  3 mg Oral QHS  . metoprolol succinate  12.5 mg Oral Daily  . multivitamin with minerals  1 tablet Oral QPC lunch  . pantoprazole  20 mg Oral Daily  . [START ON 08/26/2019] polyethylene glycol   17 g Oral Daily  . predniSONE  10 mg Oral Q breakfast  . sodium chloride  1 drop Both Eyes QID  . spironolactone  12.5 mg Oral QODAY  . spironolactone  25 mg Oral QODAY  . Tiotropium Bromide Monohydrate  2 puff Inhalation Daily  . vitamin B-12  1,000 mcg Oral QPC lunch    Deitra Mayo Office: [phone] 08/25/2019

## 2019-08-25 NOTE — Progress Notes (Signed)
Physical Therapy Session Note  Patient Details  Name: [name] MRN: [id number] Date of Birth: [birth year]  Today's Date: 08/25/2019 PT Individual Time: 1012-1108 PT Individual Time Calculation (min): 56 min   Short Term Goals: Week 1:  PT Short Term Goal 1 (Week 1): pt will perform bed mobility with supervision PT Short Term Goal 2 (Week 1): Pt will transfer sit<>stand with LRAD min A PT Short Term Goal 3 (Week 1): Pt will perform simulated car transfer with LRAD mod A  Skilled Therapeutic Interventions/Progress Updates:    Patient received semi-fowler in bed, dtr at bedside, agreeable to PT. He denies pain, but does report increased lethargy and anxiety. Discussed with patient importance of maintaining physical activity to help support increased energy levels. Patient verbalized understanding. Bed<> wc scoot with CGA. TotalA for wc mobility for time management and energy conservation. He was able to complete the arm bike 2'12", 1'47", 1'37" while maintaining O2 sats >97% on 2L O2 and HR 90-91 BPM. Patient required extended rest breaks between activity and encouragement to maintain participation. Patient able to complete STS x2 with verbal cues for proper hand placement . Patient returned to bed, wall O2, bed alarm on and call light within reach. Oh note, daughter approached PTs requesting that therapy discuss alternative dc destinations to home with patient. Dtr stating "ideally he would die here, but that's not going to happen." Dtr mentioning that wife and dtr are unable to adequately care for patient at home at current level of mobility.   Therapy Documentation Precautions:  Precautions Precautions: Fall Precaution Comments: R AKA Restrictions Weight Bearing Restrictions: Yes RLE Weight Bearing: Non weight bearing Other Position/Activity Restrictions: On 2L O2    Therapy/Group: Individual Therapy  Karoline Caldwell, PT, DPT, CBIS 08/25/2019, 12:18 PM

## 2019-08-25 NOTE — Progress Notes (Signed)
Patient ID: ADRIENE PADULA, male   DOB: 1929/07/02, 86 y.o.   MRN: [id number]   Sw was informed by PT of dtr's concern of them not being able to take care of patient in home. SW has began the conversation with spouse about SNF's. SW also discussed HC options with spouse, this is something she does not believe they are able to handle financially (24/7)  SNF options provided to spouse to discuss with dtr and patient. SW will follow up with family on tomorrow.

## 2019-08-25 NOTE — Progress Notes (Signed)
PHYSICAL MEDICINE & REHABILITATION PROGRESS NOTE  Subjective/Complaints: Has worse sinus pain today. Feels congested Asks for lidocaine jelly for sore Not sleeping well  ROS: Denies CP, N/V/D  Objective: Vital Signs: Blood pressure 130/82, pulse 97, temperature 97.9 F (36.6 C), temperature source Oral, resp. rate 17, height 5\' 9"  (1.753 m), weight 56.9 kg, SpO2 97 %. No results found. Recent Labs    08/25/19 0454  WBC 8.8  HGB 8.5*  HCT 26.5*  PLT 398   Recent Labs    08/22/19 1916 08/23/19 0702 08/24/19 0446 08/25/19 0454  NA 130*   < > 132* 129*  K 4.8  --   --  4.2  CL 94*  --   --  91*  CO2 27  --   --  31  GLUCOSE 195*  --   --  89  BUN 17  --   --  20  CREATININE 0.95  --   --  0.88  CALCIUM 9.2  --   --  8.8*   < > = values in this interval not displayed.    Physical Exam: BP 130/82   Pulse 97   Temp 97.9 F (36.6 C) (Oral)   Resp 17   Ht 5\' 9"  (1.753 m)   Wt 56.9 kg   SpO2 97%   BMI 18.52 kg/m  General: Alert and oriented x 3, No apparent distress HEENT: Head is normocephalic, atraumatic, PERRLA, EOMI, sclera anicteric, oral mucosa pink and moist, dentition intact, ext ear canals clear,  Neck: Supple without JVD or lymphadenopathy Heart: Reg rate and rhythm. No murmurs rubs or gallops Cardiovascular: No JVD. Regular rate, premature contractions. Respiratory: Normal effort.  No stridor. Decreased breath sounds.  +[place]. GI: Non-distended. BS+. Skin: Right AKA with dressing CDI Psych: Normal mood.  Normal behavior. Musc: Right AKA with edema and tenderness. Neuro: Alert Extreme HOH Motor: Bilateral upper extremities: 4/5 proximal distally  Left lower extremity: 3+/5 proximal distal, unchanged. Right lower extremity: Hip flexion, knee extension 3+/5 (some pain patient), unchanged  Assessment/Plan: 1. Functional deficits secondary to right AKA which require 3+ hours per day of interdisciplinary therapy in a comprehensive inpatient  rehab setting.  Physiatrist is providing close team supervision and 24 hour management of active medical problems listed below.  Physiatrist and rehab team continue to assess barriers to discharge/monitor patient progress toward functional and medical goals  Care Tool:  Bathing    Body parts bathed by patient: Right arm, Left arm, Chest, Abdomen, Front perineal area, Face (pr declined LB bathing)   Body parts bathed by helper: Left lower leg Body parts n/a: Buttocks, Right upper leg, Left upper leg, Right lower leg, Left lower leg   Bathing assist Assist Level: Minimal Assistance - Patient > 75%     Upper Body Dressing/Undressing Upper body dressing   What is the patient wearing?: Pull over shirt    Upper body assist Assist Level: Supervision/Verbal cueing    Lower Body Dressing/Undressing Lower body dressing      What is the patient wearing?: Underwear/pull up, Pants     Lower body assist Assist for lower body dressing: Moderate Assistance - Patient 50 - 74%     Toileting Toileting    Toileting assist Assist for toileting: Moderate Assistance - Patient 50 - 74%     Transfers Chair/bed transfer  Transfers assist     Chair/bed transfer assist level: Minimal Assistance - Patient > 75%     Locomotion Ambulation  Ambulation assist              Walk 10 feet activity   Assist           Walk 50 feet activity   Assist           Walk 150 feet activity   Assist           Walk 10 feet on uneven surface  activity   Assist           Wheelchair     Assist               Wheelchair 50 feet with 2 turns activity    Assist            Wheelchair 150 feet activity     Assist            Medical Problem List and Plan: 1.  Impaired mobility and ADLs secondary to right AKA with significant medical comorbidities  Continue CIR, 15/7 2.  Antithrombotics: -DVT/anticoagulation:  Pharmaceutical: Heparin              -antiplatelet therapy: Aspirin 81mg  daily 3. Pain Management: Has residual limb pain--educate on desensitization techniques.   Norco will be discontinued. Continue tramadol   Lidoderm patch added for left heel on 8/9  Has headaches- could be secondary to caffeine withdrawal: edited diet order to include coffee with caffeine. This has helped. Apply warm compress 15 minutes three times per day (ordered)  Lidocaine jelly BID around sacral sore  Monitor with increased exertion. 4. Mood: Appears depressed--team to provide ego support. LCSW to follow for evaluation and support.   Will hold off on antidepressant given sodium levels. Discussed with wife  Wife may spend the night to assist with patient compliance and anxiety.  Increased Xanax to .25mg  TID for anxiety             -antipsychotic agents: N/A 5. Neuropsych: This patient may be capable of making decisions on his own behalf. Intermittently disoriented.  6. Skin/Wound Care:  Monitor wound for healing.    Stump shrinker for edema control   Sacral injury: reposition frequently 7. Fluids/Electrolytes/Nutrition: Monitor I/Os 8.COPD/chronic hypoxic respiratory failure:   Continues to require supplemental oxygen (baseline)  Chest x-ray personally reviewed, mild effusion  ECG personally reviewed, showing premature complexes  Appreciate Pulm recs  Will ask pharm to educate  Need to continue to stress compliance 9. BPH/renal calculi:   Continue foley  Continue finasteride.  10. Anxiety d/o: continue Xanax tid prn. 11. Hyponatremia:   Sodium 133 on 8/14, 132 on 8/15- liberalized diet to regular, maintain fluid restriction  Na 132 on 8/16- decrease fluid restriction to 1523mL. Will ask nephrology regarding restarting Tolvaptan.   Appreciate Nephro recs  See #15 12. ABLA:   Hemoglobin 8.4 on 8/13, 8.6 on 8/16.  13. Nasal congestion: Afrin nasal spray X 3 days.  Added saline spray as well as humidification.   14. HTN: Has history of  orthostatic hypotension.  Monitor BP   Continue aldactone 12.5/25 mgQOD and Toprol XL.   Lasix restarted 20mg  daily as per nephrology  Controlled on 8/13  Monitor with increased exertion 15. CAD/Chronic systolic CHF: Monitor for symptoms with increase in activity. On aldactone, toprol XL, ASA  and Lipitor Filed Weights   08/22/19 0312 08/23/19 0500 08/24/19 0500  Weight: 53.5 kg 56.2 kg 56.9 kg   Lasix d/ced per Nephro  ?Reliability on 8/13 16. Transaminitis: Resolved  LFTs WNL on  8/13  Continue to monitor 17.  Severe hypoalbuminemia  Supplement initiated  18.  Chronic sinusitis according to patient's wife has been seen at [facility] who recommended a 38-month course of azithromycin.    Azithromycin at 250 mg/day. Has Afrin spray as well.     LOS: 4 days A FACE TO FACE EVALUATION WAS PERFORMED  [name] 08/25/2019, 9:12 AM

## 2019-08-25 NOTE — Progress Notes (Signed)
Patient and family refused CHG bath, teaching about the importance of CHG baths given. Audie Clear, LPN

## 2019-08-25 NOTE — Progress Notes (Signed)
[facility] Progress Note   86y.o.maleCOPD oxygen dependent, BPH with foley for retention, PAF, HTN, CASHD s/p CAVG, PAD with chronic revascularizations leading to a Rt AKA by Dr. Scot Dock. We were consulted for hyponatremia.  Sodium as low as 119 during this admission requiring tolvaptan.  Work-up was most consistent with SIADH.  Signed off on 8/14.  Sodium decreased again to 129 today.  Reconsulted. Assessment/ Plan:   1. Hyponatremia -mostly secondary to SIADH but some hypovolemia contributed in the past.  Urine sodium 25 and urine osmolality 369 with serum osmolality of 259 in the past.  TSH elevated to 12.2.  Likely SIADH is still ongoing 1. Institute volume restriction at least less than 1.5 L daily and ideally lower 2. No indication for tolvaptan right now 3. Repeat sodium this afternoon 4. Consider repeat urine testing if sodium does not improve 5. Hypothyroidism management as below 2. Hypothyroidism: TSH 12.16 with normal T4 and low T3.  Will start low-dose Synthroid 29mcg.  Possibly contributing to hyponatremia minimally 3. PAD s/p rt AKA 4. COPD on O2 and breathing treatments. 5. CHF (diastolic) 6. HTN 7. CKD3 (BL1-1.1): Serum creatinine lower than baseline at this time likely related to free water retention and dilution  Subjective:   Patient very hard of hearing.  Daughter at bedside states  patient is slightly more confused today but not as bad as he was when sodium was lower.  Patient states that his appetite is slightly improved.  No chest pain or vomiting   Objective:   BP 130/82   Pulse 97   Temp 97.9 F (36.6 C) (Oral)   Resp 17   Ht 5\' 9"  (1.753 m)   Wt 56.9 kg   SpO2 97%   BMI 18.52 kg/m   Intake/Output Summary (Last 24 hours) at 08/25/2019 1310 Last data filed at 08/25/2019 0944 Gross per 24 hour  Intake 598 ml  Output 1950 ml  Net -1352 ml   Weight change:   Physical Exam: GEN: NAD, sitting in bed, no obvious distress HEENT: No  conjunctival pallor, no nasal discharge, dry mouth LUNGS: Bilateral chest rise, no increased work of breathing CV: Normal rate ABD: Nondistended abdomen, no obvious masses or hernias UYQ:IHKVQ lower extremity edema at the ankles  Imaging: No results found.  Labs: BMET Recent Labs  Lab 08/21/19 0624 08/21/19 1057 08/21/19 1625 08/21/19 1625 08/22/19 0216 08/22/19 0216 08/22/19 0600 08/22/19 0857 08/22/19 1201 08/22/19 1916 08/23/19 0702 08/24/19 0446 08/25/19 0454  NA 125*   < > 127*   < > 132*   < > 134* 132* 131* 130* 133* 132* 129*  K 4.1  --  4.3  --  4.4  --  4.1  --  4.8 4.8  --   --  4.2  CL 88*  --  89*  --  93*  --  94*  --  93* 94*  --   --  91*  CO2 30  --  31  --  31  --  30  --  29 27  --   --  31  GLUCOSE 90  --  134*  --  92  --  94  --  127* 195*  --   --  89  BUN 14  --  14  --  14  --  13  --  13 17  --   --  20  CREATININE 0.84  --  0.95  --  0.87  --  0.85  --  0.87 0.95  --   --  0.88  CALCIUM 8.6*  --  8.8*  --  9.4  --  9.4  --  9.4 9.2  --   --  8.8*   < > = values in this interval not displayed.   CBC Recent Labs  Lab 08/21/19 0624 08/22/19 0600 08/25/19 0454  WBC 9.3 8.1 8.8  NEUTROABS  --  6.2  --   HGB 8.7* 8.4* 8.5*  HCT 26.0* 26.1* 26.5*  MCV 95.6 97.4 100.0  PLT 399 373 398    Medications:    . (feeding supplement) PROSource Plus  30 mL Oral BID BM  . aspirin EC  81 mg Oral QPC lunch  . atorvastatin  20 mg Oral QHS  . azithromycin  250 mg Oral Daily  . budesonide-formoterol  2 puff Inhalation BID  . Chlorhexidine Gluconate Cloth  6 each Topical Daily  . enoxaparin (LOVENOX) injection  40 mg Subcutaneous Q24H  . feeding supplement (ENSURE ENLIVE)  237 mL Oral TID BM  . finasteride  5 mg Oral Q lunch  . fluticasone  2 spray Each Nare Daily  . furosemide  20 mg Oral Daily  . hydrocerin   Topical BID  . lidocaine  1 patch Transdermal Daily  . lidocaine  1 application Topical [id number]  . melatonin  3 mg Oral QHS  . metoprolol  succinate  12.5 mg Oral Daily  . multivitamin with minerals  1 tablet Oral QPC lunch  . pantoprazole  20 mg Oral Daily  . [START ON 08/26/2019] polyethylene glycol  17 g Oral Daily  . predniSONE  10 mg Oral Q breakfast  . sodium chloride  1 drop Both Eyes QID  . spironolactone  12.5 mg Oral QODAY  . spironolactone  25 mg Oral QODAY  . Tiotropium Bromide Monohydrate  2 puff Inhalation Daily  . vitamin B-12  1,000 mcg Oral QPC lunch      Reesa Chew  08/25/2019, 1:10 PM

## 2019-08-25 NOTE — Progress Notes (Signed)
Occupational Therapy Session Note  Patient Details  Name: [name] MRN: [id number] Date of Birth: 03/27/1929  Today's Date: 08/25/2019 OT Individual Time: 1430-1455 OT Individual Time Calculation (min): 25 min    Short Term Goals: Week 1:  OT Short Term Goal 1 (Week 1): Pt will complete LB bathing sit to stand with min assist. OT Short Term Goal 2 (Week 1): Pt will complete LB dressing sit to stand with mod assist for 2 consecutive sessions. OT Short Term Goal 3 (Week 1): Pt will complete squat pivot or stand pivot transfer ot the BSC/drop arm commode with min assist. OT Short Term Goal 4 (Week 1): Pt will complete tolet hygiene and clothing management sit to stand with mod assist.     Skilled Therapeutic Interventions/Progress Updates:    Pt sitting up in w/c, c/o slight light headedness and significant fatigue.  Wife at bedside, reporting his BP taken by nurse tech recently was relatively low.  OT assessed vitals prior to treatment and noted: BP 122/67 mmHg, Pulse 90, O2 100%.  Pt instructed on functional mobility back to bed due to symptoms.  Pt completed squat pivot transfer w/c to EOB with increased time and min assist.  Sit to supine with supervision.  Nurse arrived reporting per PA, discontinue further therapies for today.  Call bell in reach, bed alarm on.  Therapy Documentation Precautions:  Precautions Precautions: Fall Precaution Comments: R AKA Restrictions Weight Bearing Restrictions: Yes RLE Weight Bearing: Non weight bearing Other Position/Activity Restrictions: On 2L O2   Therapy/Group: Individual Therapy  Ezekiel Slocumb 08/25/2019, 5:02 PM

## 2019-08-26 ENCOUNTER — Inpatient Hospital Stay (HOSPITAL_COMMUNITY): Payer: Medicare Other | Admitting: Occupational Therapy

## 2019-08-26 ENCOUNTER — Inpatient Hospital Stay (HOSPITAL_COMMUNITY): Payer: Medicare Other

## 2019-08-26 ENCOUNTER — Encounter (HOSPITAL_BASED_OUTPATIENT_CLINIC_OR_DEPARTMENT_OTHER): Payer: Medicare Other | Admitting: Internal Medicine

## 2019-08-26 DIAGNOSIS — I5022 Chronic systolic (congestive) heart failure: Secondary | ICD-10-CM | POA: Diagnosis not present

## 2019-08-26 DIAGNOSIS — M25551 Pain in right hip: Secondary | ICD-10-CM

## 2019-08-26 DIAGNOSIS — Z89611 Acquired absence of right leg above knee: Secondary | ICD-10-CM | POA: Diagnosis not present

## 2019-08-26 DIAGNOSIS — G8918 Other acute postprocedural pain: Secondary | ICD-10-CM | POA: Diagnosis not present

## 2019-08-26 DIAGNOSIS — E039 Hypothyroidism, unspecified: Secondary | ICD-10-CM

## 2019-08-26 DIAGNOSIS — L89159 Pressure ulcer of sacral region, unspecified stage: Secondary | ICD-10-CM

## 2019-08-26 LAB — SODIUM: Sodium: 132 mmol/L — ABNORMAL LOW (ref 135–145)

## 2019-08-26 MED ORDER — DICLOFENAC SODIUM 1 % EX GEL
2.0000 g | Freq: Four times a day (QID) | CUTANEOUS | Status: DC
  Administered 2019-08-27 – 2019-08-30 (×8): 2 g via TOPICAL
  Filled 2019-08-26: qty 100

## 2019-08-26 NOTE — Progress Notes (Signed)
Occupational Therapy Session Note  Patient Details  Name: [name] MRN: [id number] Date of Birth: 08-Nov-1929  Today's Date: 08/26/2019 OT Individual Time: 6067-7034 OT Individual Time Calculation (min): 45 min    Short Term Goals: Week 1:  OT Short Term Goal 1 (Week 1): Pt will complete LB bathing sit to stand with min assist. OT Short Term Goal 2 (Week 1): Pt will complete LB dressing sit to stand with mod assist for 2 consecutive sessions. OT Short Term Goal 3 (Week 1): Pt will complete squat pivot or stand pivot transfer ot the BSC/drop arm commode with min assist. OT Short Term Goal 4 (Week 1): Pt will complete tolet hygiene and clothing management sit to stand with mod assist.  Skilled Therapeutic Interventions/Progress Updates:    Pt greeted semi-reclined in bed, agreeable to wash up and get dressed with encouragement from OT and wife. Pt completed bed mobility with increased time and supervision. Squat-pivot to wc on R side with CGA. Pt completed UB bathing at the sink with increased time and set-up A. LB bathing with min A sit<>stand, then pt was able to remove unilateral UE from the sink to wash peri-area and wash buttocks. Worked on pt threading pant legs but needed OT assist to pull pants up and button them. Educated on use of elastic waist band to increase independence with donning pants.  Pt agreeable to stay sitting up in wc and left at the sink with alarm belt on and spouse present.  Therapy Documentation Precautions:  Precautions Precautions: Fall Precaution Comments: R AKA Restrictions Weight Bearing Restrictions: Yes RLE Weight Bearing: Non weight bearing Other Position/Activity Restrictions: On 2L O2 Pain:  No number given. Generalized pain, manageable. Rest and repositioned for comfort.   Therapy/Group: Individual Therapy  Valma Cava 08/26/2019, 10:01 AM

## 2019-08-26 NOTE — Progress Notes (Signed)
Occupational Therapy Session Note  Patient Details  Name: [name] MRN: [id number] Date of Birth: [birth year]  Today's Date: 08/26/2019 OT Individual Time: 9166-0600 OT Individual Time Calculation (min): 33 min    Short Term Goals: Week 1:  OT Short Term Goal 1 (Week 1): Pt will complete LB bathing sit to stand with min assist. OT Short Term Goal 2 (Week 1): Pt will complete LB dressing sit to stand with mod assist for 2 consecutive sessions. OT Short Term Goal 3 (Week 1): Pt will complete squat pivot or stand pivot transfer ot the BSC/drop arm commode with min assist. OT Short Term Goal 4 (Week 1): Pt will complete tolet hygiene and clothing management sit to stand with mod assist.  Skilled Therapeutic Interventions/Progress Updates:    Pt in bed to start session.  He was able to transfer to the EOB with min assist in order to work on sit to stand and standing balance.  Therapist provided mod assist for pt to donn his left shoe in preparation to complete standing.  Oxygen sats on 2Ls in sitting at 95% with HR at 92.  Pt completed sit to stand from the EOB with min assist and min instructional cueing for hand placement.  He was able to maintain standing for intervals of 1-2 mins.  He also completed stepping forward and backward two steps with min assist.  Short distance mobility was completed with the RW to the wheelchair approximately 3' with min assist as well.  Pt needs verbal encouragement as he states that he wont be able to do it, but then does it very well once he attempts it.  Finished session with transition back to supine with min guard assist.  He was left with his spouse in the room with call button and phone in reach and bed alarm in place.  Nursing made aware of pt's request for more fluids.    Therapy Documentation Precautions:  Precautions Precautions: Fall Precaution Comments: R AKA Restrictions Weight Bearing Restrictions: No RLE Weight Bearing: Non weight bearing Other  Position/Activity Restrictions: On 2L O2  Pain: Pain Assessment Pain Scale: Faces Pain Score: 0-No pain ADL: See Care Tool Section for some details of mobility and selfcare  Therapy/Group: Individual Therapy  Leann Mayweather OTR/L 08/26/2019, 4:12 PM

## 2019-08-26 NOTE — Progress Notes (Signed)
Patient ID: [name], male   DOB: [birth year], 86 y.o.   MRN: [id number]   Sw will consult with MD about possibilities of patient d/c home with hospice if family does not agree to SNF d/c

## 2019-08-26 NOTE — Progress Notes (Signed)
Patient ID: [name], male   DOB: 02-Oct-1929, 86 y.o.   MRN: [id number]   Sw sending pt referral to all facilities in [facility].

## 2019-08-26 NOTE — Progress Notes (Addendum)
Patient ID: [name], male   DOB: 1929/09/13, 86 y.o.   MRN: [id number]   Pt has received SNF bed offers from: [facility] declined due to no bed availability.

## 2019-08-26 NOTE — NC FL2 (Signed)
[place] LEVEL OF CARE SCREENING TOOL     IDENTIFICATION  Patient Name: [name] Birthdate: 07-27-1929 Sex: male Admission Date (Current Location): 08/21/2019  [facility] and [place] Number:  Herbalist and Address:  The . [facility], [place] [address], [place], [place] [address]      Provider Number:    Attending Physician Name and Address:  Jamse Arn, MD  Relative Name and Phone Number:  Lorris Carducci [phone]    Current Level of Care:   Recommended Level of Care: [place] Prior Approval Number:    Date Approved/Denied:   PASRR Number: [id number]  Discharge Plan: SNF    Current Diagnoses: Patient Active Problem List   Diagnosis Date Noted  . Hypothyroidism   . Right hip pain   . Pressure injury of skin of coccygeal region   . Post-operative pain   . Chronic systolic congestive heart failure ([place])   . Urinary retention due to benign prostatic hyperplasia 08/20/2019  . SOB (shortness of breath)   . Chronic systolic CHF (congestive heart failure) ([place])   . Hypoalbuminemia due to protein-calorie malnutrition ([place])   . Transaminitis   . Acute blood loss anemia   . Hyponatremia   . Postoperative pain   . Supplemental oxygen dependent   . S/P AKA (above knee amputation) ([place]) 08/14/2019  . Peripheral arterial disease ([place]) 08/11/2019  . Abnormal urinalysis 08/11/2019  . Critical lower limb ischemia 08/11/2019  . DNR (do not resuscitate)   . Voiding difficulty   . Palliative care by specialist   . Pressure injury of skin 06/29/2019  . Cellulitis of left lower extremity 06/28/2019  . Cellulitis of right lower extremity 06/27/2019  . AKI (acute kidney injury) ([place]) 06/27/2019  . Headache 10/16/2018  . Severe major depression without psychotic features ([place]) 09/22/2017  . Generalized anxiety disorder 09/22/2017  . Preoperative cardiovascular examination 05/10/2017  . Weakness generalized  04/04/2017  . Chest pain 03/24/2017  . AV block, [place] 1 03/24/2017  . CAD (coronary artery disease) 03/24/2017  . Chronic diastolic CHF (congestive heart failure) ([place]) 03/24/2017  . COPD (chronic obstructive pulmonary disease) ([place]) 03/24/2017  . NSTEMI (non-ST elevated myocardial infarction) ([place])   . Elevated troponin 03/09/2017  . Acute on chronic diastolic heart failure ([place])   . Tachycardia 03/08/2017  . Influenza A 02/04/2017  . Cor pulmonale, chronic (HCC)/ clinical dx  09/13/2016  . Orthostatic hypotension 09/26/2015  . Angina at rest [facility]) 09/24/2015  . BPH (benign prostatic hyperplasia) 09/13/2015  . GERD (gastroesophageal reflux disease) 09/13/2015  . Anxiety 09/13/2015  . Sinusitis, chronic 09/09/2015  . DOE (dyspnea on exertion) 04/15/2015  . Acute heart failure ([place]) 03/18/2015  . Right calf pain 06/18/2014  . Coronary artery disease involving coronary bypass graft of native heart with angina pectoris ([place])   . Peripheral vascular disease ([place])   . Essential hypertension   . Obstructive sleep apnea   . Rhinitis, chronic 01/29/2007  . COPD   GOLD IV/ 02/steroid dep  01/29/2007  . Chronic respiratory failure with hypoxia and hypercapnia ([place]) 01/29/2007  . Hyperlipidemia     Orientation RESPIRATION BLADDER Height & Weight     Self, Situation, Time, Place  O2, Other (Comment) (2L.  BIPAP unit at night) Indwelling catheter (requires I/O caths q4-6. Continent of bowel) Weight: 126 lb 1.7 oz (57.2 kg) Height:  5\' 9"  (175.3 cm)  BEHAVIORAL SYMPTOMS/MOOD NEUROLOGICAL BOWEL NUTRITION STATUS  Continent Diet  AMBULATORY STATUS COMMUNICATION OF NEEDS Skin   Extensive Assist Verbally Surgical wounds (Right AKA, staples in place, brusing and bogginess noted (daily dressing changes).)                       Personal Care Assistance Level of Assistance  Bathing, Feeding, Dressing Bathing Assistance: Maximum assistance Feeding assistance: Maximum  assistance Dressing Assistance: Maximum assistance     Functional Limitations Info  Hearing   Hearing Info: Impaired      SPECIAL CARE FACTORS FREQUENCY  PT (By licensed PT), OT (By licensed OT), Speech therapy     PT Frequency: 5x a week OT Frequency: 5x a week     Speech Therapy Frequency: 5x a week      Contractures Contractures Info: Not present    Additional Factors Info  Code Status Code Status Info: Partial             Current Medications (08/26/2019):  This is the current hospital active medication list Current Facility-Administered Medications  Medication Dose Route Frequency Provider Last Rate Last Admin  . (feeding supplement) PROSource Plus liquid 30 mL  30 mL Oral BID BM Jamse Arn, MD   30 mL at 08/25/19 1808  . acetaminophen (TYLENOL) tablet 325-650 mg  325-650 mg Oral Q4H PRN Bary Leriche, PA-C   650 mg at 08/26/19 9528  . ALPRAZolam Duanne Moron) tablet 0.25 mg  0.25 mg Oral TID PRN Izora Ribas, MD   0.25 mg at 08/25/19 2127  . alum & mag hydroxide-simeth (MAALOX/MYLANTA) 200-200-20 MG/5ML suspension 30 mL  30 mL Oral Q4H PRN Bary Leriche, PA-C   30 mL at 08/23/19 1041  . aspirin EC tablet 81 mg  81 mg Oral QPC lunch Raulkar, Clide Deutscher, MD      . atorvastatin (LIPITOR) tablet 20 mg  20 mg Oral QHS Love, Pamela S, PA-C   20 mg at 08/25/19 2126  . azithromycin (ZITHROMAX) tablet 250 mg  250 mg Oral Daily Bary Leriche, PA-C   250 mg at 08/25/19 4132  . bisacodyl (DULCOLAX) suppository 10 mg  10 mg Rectal Daily PRN Love, Pamela S, PA-C      . budesonide-formoterol (Symbicort) 160-4.5 mg/acutation inhaler - own supply  2 puff Inhalation BID Jamse Arn, MD   2 puff at 08/26/19 [phone]  . calcium carbonate (TUMS - dosed in mg elemental calcium) chewable tablet 200 mg of elemental calcium  1 tablet Oral BID PRN Raulkar, Clide Deutscher, MD   200 mg of elemental calcium at 08/25/19 2127  . Chlorhexidine Gluconate Cloth 2 % PADS 6 each  6 each Topical  Daily Jamse Arn, MD   6 each at 08/23/19 1737  . diclofenac Sodium (VOLTAREN) 1 % topical gel 2 g  2 g Topical QID Jamse Arn, MD      . diphenhydrAMINE (BENADRYL) 12.5 MG/5ML elixir 12.5-25 mg  12.5-25 mg Oral Q6H PRN Love, Pamela S, PA-C      . enoxaparin (LOVENOX) injection 40 mg  40 mg Subcutaneous Q24H Love, Pamela S, PA-C   40 mg at 08/26/19 0835  . feeding supplement (ENSURE ENLIVE) (ENSURE ENLIVE) liquid 237 mL  237 mL Oral TID BM Jamse Arn, MD   237 mL at 08/25/19 1251  . finasteride (PROSCAR) tablet 5 mg  5 mg Oral Q lunch Bary Leriche, PA-C   5 mg at 08/25/19 2133  . fluticasone (Flonase) 50  mcg/actuation nasal spray - own supply  2 spray Each Nare Daily Bary Leriche, PA-C   2 spray at 08/25/19 8416  . furosemide (LASIX) tablet 20 mg  20 mg Oral Daily Dwana Melena, MD   20 mg at 08/25/19 6063  . guaiFENesin-dextromethorphan (ROBITUSSIN DM) 100-10 MG/5ML syrup 5-10 mL  5-10 mL Oral Q6H PRN Love, Pamela S, PA-C      . hydrocerin (EUCERIN) cream   Topical BID Bary Leriche, [place]   Given at 08/25/19 2128  . levalbuterol (XOPENEX) nebulizer solution 0.63 mg  0.63 mg Inhalation Q4H PRN Bary Leriche, PA-C   0.63 mg at 08/23/19 0801  . levothyroxine (SYNTHROID) tablet 25 mcg  25 mcg Oral Q0600 Reesa Chew, MD      . lidocaine (LIDODERM) 5 % 1 patch  1 patch Transdermal Daily Bary Leriche, PA-C   1 patch at 08/25/19 0160  . lidocaine (XYLOCAINE) 2 % jelly 1 application  1 application Topical [id number] Raulkar, Clide Deutscher, MD   1 application at 55/73/22 2127  . melatonin tablet 3 mg  3 mg Oral QHS Raulkar, Clide Deutscher, MD   3 mg at 08/25/19 2127  . metoprolol succinate (TOPROL-XL) 24 hr tablet 12.5 mg  12.5 mg Oral Daily Bary Leriche, PA-C   12.5 mg at 08/25/19 0254  . multivitamin with minerals tablet 1 tablet  1 tablet Oral QPC lunch Raulkar, Clide Deutscher, MD      . nitroGLYCERIN (NITROSTAT) SL tablet 0.4 mg  0.4 mg Sublingual Q5 min PRN Love, Pamela S, PA-C      .  pantoprazole (PROTONIX) EC tablet 20 mg  20 mg Oral Daily Raulkar, Clide Deutscher, MD   20 mg at 08/25/19 0821  . Polyethly Glycol-Proply Glycol 0.4-0.3% (Systane) soln - own supply  1 drop Ophthalmic BID PRN Love, Pamela S, PA-C      . polyethylene glycol (MIRALAX / GLYCOLAX) packet 17 g  17 g Oral Daily Raulkar, Clide Deutscher, MD      . predniSONE (DELTASONE) tablet 10 mg  10 mg Oral Q breakfast Bary Leriche, PA-C   10 mg at 08/25/19 0820  . prochlorperazine (COMPAZINE) tablet 5-10 mg  5-10 mg Oral Q6H PRN Love, Pamela S, PA-C       Or  . prochlorperazine (COMPAZINE) injection 5-10 mg  5-10 mg Intramuscular Q6H PRN Love, Pamela S, PA-C       Or  . prochlorperazine (COMPAZINE) suppository 12.5 mg  12.5 mg Rectal Q6H PRN Love, Pamela S, PA-C      . sodium chloride (Muro 128) 5% ophthalmic solution - own supply  1 drop Both Eyes QID Love, Pamela S, PA-C   1 drop at 08/25/19 1600  . sodium phosphate (FLEET) 7-19 GM/118ML enema 1 enema  1 enema Rectal Once PRN Love, Pamela S, PA-C      . spironolactone (ALDACTONE) tablet 12.5 mg  12.5 mg Oral QODAY Raulkar, Clide Deutscher, MD   12.5 mg at 08/25/19 2706  . spironolactone (ALDACTONE) tablet 25 mg  25 mg Oral QODAY Raulkar, Clide Deutscher, MD   25 mg at 08/24/19 0909  . Tiotropium Bromide Monohydrate (Spirva Respimat) 2.5 mcg/actuation aerosol - own supply  2 puff Inhalation Daily Bary Leriche, [place]   Given at 08/24/19 0810  . vitamin B-12 (CYANOCOBALAMIN) tablet 1,000 mcg  1,000 mcg Oral QPC lunch Raulkar, Clide Deutscher, MD         Discharge Medications: Please see discharge  summary for a list of discharge medications.  Relevant Imaging Results:  Relevant Lab Results:   Additional Information    Dyanne Iha

## 2019-08-26 NOTE — Progress Notes (Signed)
Patient ID: [name], male   DOB: 07-14-1929, 86 y.o.   MRN: [id number]   Sw followed up with pt spouse for SNF options, left voicemail.

## 2019-08-26 NOTE — Progress Notes (Signed)
Pt refused synthroid. Pt states " I do not understand why I am taking this". Pt educated on the importance of the medication. Pt continues to refuse.

## 2019-08-26 NOTE — Progress Notes (Signed)
Physical Therapy Session Note  Patient Details  Name: [name] MRN: [id number] Date of Birth: 04-22-1929  Today's Date: 08/26/2019 PT Individual Time: 1302-1420 PT Individual Time Calculation (min): 78 min   Short Term Goals: Week 1:  PT Short Term Goal 1 (Week 1): pt will perform bed mobility with supervision PT Short Term Goal 2 (Week 1): Pt will transfer sit<>stand with LRAD min A PT Short Term Goal 3 (Week 1): Pt will perform simulated car transfer with LRAD mod A  Skilled Therapeutic Interventions/Progress Updates:   Received pt sitting in [facility] with wife present at bedside, pt agreeable to therapy, and reported pain "all over" but did not state pain level. Pt specifically reported discomfort in sinuses but also c/o L heel pain (premedicated). Pt shivering and donned jacket sitting in WC with min A and additional blanket. Session with emphasis on discharge planning, functional mobility/transfers, generalized strengthening, dynamic sitting/standing balance/coordination, simulated car transfers, and improved activity tolerance. Pt on 2L O2 via [place] throughout session with O2 sat >95%. Discussed with pt's wife D/C plans. Per pt's wife, plans to D/C to SNF for additional care/rehab with the future hopes to D/C to their townhouse with level entry and no stairs to enter. [name] with questions regarding prehab for preparation for receiving prosthetic. Therapist educated pt/pt's wife on timeline and recovery process but also emphasized importance of pt having the motivation to participate and continue working in therapy. Donned L sock and shoe total A for time management purposes and pt transported to ortho gym in [facility] total A and performed simulated car transfer with RW and mod A with significantly increased time. Upon entering the car pt with posterior LOB into car seat due to not backing up to car frame completely. Pt educated on safety techniques when entering the car. Upon exiting pt required 3  attempts to come into standing and required max cues for hand placement, anterior weight shifting, and scooting hips forward. Pt transported to dayroom in [facility] total A and performed the following exercises seated in [facility] with supervision and verbal cues for technique: -L knee extension 2x10 -hip flexion 2x10 bilaterally -hip adduction ball squeezes 2x10 Pt transferred sit<>stand with RW x 2 trials and mod A with cues for hand placement. Upon standing pt with increased anxiety stating "I'm falling, I'm falling" therapist attempted to calm pt however pt insistent on returning to sitting position and only able to remain standing ~20 seconds. Pt transported back to room in [facility] total A and transferred WC<>bed via lateral scoot min A. Doffed sock, shoe, and jacket sitting EOB with total A and transferred sit<>supine with min A for LE management. Concluded session with pt supine in bed, needs within reach, and bed alarm on. Therapist placed pillow under L heel for pressure relief.   Therapy Documentation Precautions:  Precautions Precautions: Fall Precaution Comments: R AKA Restrictions Weight Bearing Restrictions: Yes RLE Weight Bearing: Non weight bearing Other Position/Activity Restrictions: On 2L O2   Therapy/Group: Individual Therapy Alfonse Alpers PT, DPT   08/26/2019, 7:33 AM

## 2019-08-26 NOTE — Progress Notes (Signed)
PHYSICAL MEDICINE & REHABILITATION PROGRESS NOTE  Subjective/Complaints: Patient seen laying in bed this AM, sleeping, easily arousable.  He states he did not sleep well overnight due to right hip pain, which is chronic.  He was seen by vascular yesterday, notes reviewed, continue current management.  He was seen by nephrology yesterday, notes reviewed-low-dose Synthroid initiated.  ROS: + Shortness of breath, right hip pain.  Denies CP, N/V/D  Objective: Vital Signs: Blood pressure 126/69, pulse 93, temperature 97.7 F (36.5 C), temperature source Oral, resp. rate 20, height 5\' 9"  (1.753 m), weight 57.2 kg, SpO2 96 %. No results found. Recent Labs    08/25/19 0454  WBC 8.8  HGB 8.5*  HCT 26.5*  PLT 398   Recent Labs    08/25/19 0454 08/25/19 0454 08/25/19 1352 08/26/19 0643  NA 129*   < > 130* 132*  K 4.2  --   --   --   CL 91*  --   --   --   CO2 31  --   --   --   GLUCOSE 89  --   --   --   BUN 20  --   --   --   CREATININE 0.88  --   --   --   CALCIUM 8.8*  --   --   --    < > = values in this interval not displayed.    Physical Exam: BP 126/69 (BP Location: Left Arm)   Pulse 93   Temp 97.7 F (36.5 C) (Oral)   Resp 20   Ht 5\' 9"  (1.753 m)   Wt 57.2 kg   SpO2 96%   BMI 18.62 kg/m  Constitutional: No distress . Vital signs reviewed. HENT: Normocephalic.  Atraumatic. Eyes: EOMI. No discharge. Cardiovascular: No JVD.  RRR. Respiratory: Normal effort.  No stridor.  Bilateral clear to auscultation.  + [place]. GI: Non-distended.  BS +. Skin: Right AKA with dressing CDI Psych: Somewhat anxious. Musc: Right AKA with edema and tenderness, improving Coccyx ulcer stage II-III, will follow, unable to fully turn patient this AM. Neuro: Alert Extreme HOH Motor: Bilateral upper extremities: 4/5 proximal distally, unchanged Left lower extremity: 3+/5 proximal distal, unchanged. Right lower extremity: Hip flexion, knee extension 3+/5 (some pain patient),  unchanged  Assessment/Plan: 1. Functional deficits secondary to right AKA which require 3+ hours per day of interdisciplinary therapy in a comprehensive inpatient rehab setting.  Physiatrist is providing close team supervision and 24 hour management of active medical problems listed below.  Physiatrist and rehab team continue to assess barriers to discharge/monitor patient progress toward functional and medical goals  Care Tool:  Bathing    Body parts bathed by patient: Right arm, Left arm, Chest, Abdomen, Front perineal area, Face (pr declined LB bathing)   Body parts bathed by helper: Left lower leg Body parts n/a: Buttocks, Right upper leg, Left upper leg, Right lower leg, Left lower leg   Bathing assist Assist Level: Minimal Assistance - Patient > 75%     Upper Body Dressing/Undressing Upper body dressing   What is the patient wearing?: Pull over shirt    Upper body assist Assist Level: Supervision/Verbal cueing    Lower Body Dressing/Undressing Lower body dressing      What is the patient wearing?: Underwear/pull up, Pants     Lower body assist Assist for lower body dressing: Moderate Assistance - Patient 50 - 74%     Chartered loss adjuster  assist Assist for toileting: Moderate Assistance - Patient 50 - 74%     Transfers Chair/bed transfer  Transfers assist     Chair/bed transfer assist level: Minimal Assistance - Patient > 75%     Locomotion Ambulation   Ambulation assist              Walk 10 feet activity   Assist           Walk 50 feet activity   Assist           Walk 150 feet activity   Assist           Walk 10 feet on uneven surface  activity   Assist           Wheelchair     Assist               Wheelchair 50 feet with 2 turns activity    Assist            Wheelchair 150 feet activity     Assist            Medical Problem List and Plan: 1.  Impaired mobility  and ADLs secondary to right AKA with significant medical comorbidities  Continue CIR, 15/7 2.  Antithrombotics: -DVT/anticoagulation:  Pharmaceutical: Heparin             -antiplatelet therapy: Aspirin 81mg  daily 3. Pain Management: Has residual limb pain--educate on desensitization techniques.   Norco will be discontinued. Continue tramadol   Lidoderm patch added for left heel on 8/9  Has headaches- could be secondary to caffeine withdrawal: edited diet order to include coffee with caffeine.    Apply warm compress 15 minutes three times per day (ordered)  Lidocaine jelly BID around coccyx sore  Voltaren gel to right hip ordered  Monitor with increased exertion. 4. Mood: Appears depressed--team to provide ego support. LCSW to follow for evaluation and support.   Will hold off on antidepressant given sodium levels. Discussed with wife  Wife may spend the night to assist with patient compliance and anxiety.  Xanax to .25mg  TID for anxiety             -antipsychotic agents: N/A 5. Neuropsych: This patient may be capable of making decisions on his own behalf. Intermittently disoriented.  6. Skin/Wound Care:  Monitor wound for healing.    Stump shrinker for edema control   Coccyx injury: reposition frequently 7. Fluids/Electrolytes/Nutrition: Monitor I/Os 8.COPD/chronic hypoxic respiratory failure:   Continues to require supplemental oxygen (baseline)  Chest x-ray personally reviewed, mild effusion  ECG personally reviewed, showing premature complexes  Appreciate Pulm recs  Will ask pharm to educate  Need to continue to stress compliance 9. BPH/renal calculi:   Continue foley  Continue finasteride.  10. Anxiety d/o: continue Xanax tid prn. 11. Hyponatremia:   Sodium 132 on 8/17  Fluid restriction to 1560mL.   Tolvaptan on hold per nephro.   Appreciate Nephro recs  See #15 12. ABLA:   Hemoglobin 8.5 on 8/16 13. Nasal congestion: Afrin nasal spray X 3 days.  Added saline spray as  well as humidification.   14. HTN: Has history of orthostatic hypotension.  Monitor BP   Continue aldactone 12.5/25 mgQOD and Toprol XL.   Lasix restarted 20mg  daily as per nephrology  Controlled on 8/17  Monitor with increased exertion 15. CAD/Chronic systolic CHF: Monitor for symptoms with increase in activity. On aldactone, toprol XL, ASA  and Lipitor [employer]  08/23/19 0500 08/24/19 0500 08/26/19 0500  Weight: 56.2 kg 56.9 kg 57.2 kg   Lasix restarted per Nephro  Stable on 8/17 16. Transaminitis: Resolved  LFTs WNL on 8/13  Continue to monitor 17.  Severe hypoalbuminemia  Supplement initiated  18.  Chronic sinusitis according to patient's wife has been seen at [facility] who recommended a 68-month course of azithromycin.    Azithromycin at 250 mg/day. Has Afrin spray as well.  19.  Hypothyroidism  Synthroid    LOS: 5 days A FACE TO FACE EVALUATION WAS PERFORMED  [name] [name] 08/26/2019, 8:49 AM

## 2019-08-26 NOTE — Progress Notes (Signed)
[facility] Progress Note   86y.o.maleCOPD oxygen dependent, BPH with foley for retention, PAF, HTN, CASHD s/p CAVG, PAD with chronic revascularizations leading to a Rt AKA by Dr. Scot Dock. We were consulted for hyponatremia.  Sodium as low as 119 during this admission requiring tolvaptan.  Work-up was most consistent with SIADH.  Signed off on 8/14.  Monitoring hyponatremia Assessment/ Plan:   1. Hyponatremia -mostly secondary to SIADH but some hypovolemia contributed in the past.  Urine sodium 25 and urine osmolality 369 with serum osmolality of 259 in the past.  TSH elevated to 12.2.  Likely SIADH is still ongoing.  Hyponatremia is improving 1. Continue volume restriction at least less than 1.5 L daily and ideally lower 2. No indication for tolvaptan right now 3. Monitor sodium daily 4. Consider urea if the patient's sodium decreases further 5. Hypothyroidism management as below 2. Hypothyroidism: TSH 12.16 with normal T4 and low T3.  Continue Synthroid 25 MCG daily.  Possibly contributing to hyponatremia minimally 3. PAD s/p rt AKA 4. COPD on O2 and breathing treatments. 5. CHF (diastolic) 6. HTN 7. CKD3 (BL1-1.1): Serum creatinine lower than baseline at this time likely related to free water retention and dilution  Subjective:   Patient stable over the past 24 hours.  Wife at bedside thinks his mental status is acceptable.  Patient states that he feels pretty well today.  He was confused about why he was taking Synthroid and after he was explained he agrees the medication may be helpful.  Otherwise denies complaints.   Objective:   BP 126/69 (BP Location: Left Arm)   Pulse 93   Temp 97.7 F (36.5 C) (Oral)   Resp 20   Ht 5\' 9"  (1.753 m)   Wt 57.2 kg   SpO2 96%   BMI 18.62 kg/m   Intake/Output Summary (Last 24 hours) at 08/26/2019 1222 Last data filed at 08/26/2019 1052 Gross per 24 hour  Intake 240 ml  Output 550 ml  Net -310 ml   Weight change:    Physical Exam: GEN: NAD, sitting in bed, no obvious distress HEENT: No conjunctival pallor, no nasal discharge, dry mouth LUNGS: Bilateral chest rise, no increased work of breathing CV: Normal rate ABD: Nondistended abdomen, no obvious masses or hernias SJG:GEZMO lower extremity edema at the ankles  Imaging: No results found.  Labs: BMET Recent Labs  Lab 08/21/19 0624 08/21/19 1057 08/21/19 1625 08/21/19 1625 08/22/19 0216 08/22/19 0216 08/22/19 0600 08/22/19 0857 08/22/19 1201 08/22/19 1916 08/23/19 0702 08/24/19 0446 08/25/19 0454 08/25/19 1352 08/26/19 0643  NA 125*   < > 127*   < > 132*   < > 134*   < > 131* 130* 133* 132* 129* 130* 132*  K 4.1  --  4.3  --  4.4  --  4.1  --  4.8 4.8  --   --  4.2  --   --   CL 88*  --  89*  --  93*  --  94*  --  93* 94*  --   --  91*  --   --   CO2 30  --  31  --  31  --  30  --  29 27  --   --  31  --   --   GLUCOSE 90  --  134*  --  92  --  94  --  127* 195*  --   --  89  --   --  BUN 14  --  14  --  14  --  13  --  13 17  --   --  20  --   --   CREATININE 0.84  --  0.95  --  0.87  --  0.85  --  0.87 0.95  --   --  0.88  --   --   CALCIUM 8.6*  --  8.8*  --  9.4  --  9.4  --  9.4 9.2  --   --  8.8*  --   --    < > = values in this interval not displayed.   CBC Recent Labs  Lab 08/21/19 0624 08/22/19 0600 08/25/19 0454  WBC 9.3 8.1 8.8  NEUTROABS  --  6.2  --   HGB 8.7* 8.4* 8.5*  HCT 26.0* 26.1* 26.5*  MCV 95.6 97.4 100.0  PLT 399 373 398    Medications:    . (feeding supplement) PROSource Plus  30 mL Oral BID BM  . aspirin EC  81 mg Oral QPC lunch  . atorvastatin  20 mg Oral QHS  . azithromycin  250 mg Oral Daily  . budesonide-formoterol  2 puff Inhalation BID  . Chlorhexidine Gluconate Cloth  6 each Topical Daily  . diclofenac Sodium  2 g Topical QID  . enoxaparin (LOVENOX) injection  40 mg Subcutaneous Q24H  . feeding supplement (ENSURE ENLIVE)  237 mL Oral TID BM  . finasteride  5 mg Oral Q lunch  .  fluticasone  2 spray Each Nare Daily  . furosemide  20 mg Oral Daily  . hydrocerin   Topical BID  . levothyroxine  25 mcg Oral Q0600  . lidocaine  1 patch Transdermal Daily  . lidocaine  1 application Topical [id number]  . melatonin  3 mg Oral QHS  . metoprolol succinate  12.5 mg Oral Daily  . multivitamin with minerals  1 tablet Oral QPC lunch  . pantoprazole  20 mg Oral Daily  . polyethylene glycol  17 g Oral Daily  . predniSONE  10 mg Oral Q breakfast  . sodium chloride  1 drop Both Eyes QID  . spironolactone  12.5 mg Oral QODAY  . spironolactone  25 mg Oral QODAY  . Tiotropium Bromide Monohydrate  2 puff Inhalation Daily  . vitamin B-12  1,000 mcg Oral QPC lunch      Reesa Chew  08/26/2019, 12:22 PM

## 2019-08-27 ENCOUNTER — Inpatient Hospital Stay (HOSPITAL_COMMUNITY): Payer: Medicare Other

## 2019-08-27 ENCOUNTER — Inpatient Hospital Stay (HOSPITAL_COMMUNITY): Payer: Medicare Other | Admitting: Occupational Therapy

## 2019-08-27 DIAGNOSIS — G8918 Other acute postprocedural pain: Secondary | ICD-10-CM | POA: Diagnosis not present

## 2019-08-27 DIAGNOSIS — Z89611 Acquired absence of right leg above knee: Secondary | ICD-10-CM | POA: Diagnosis not present

## 2019-08-27 DIAGNOSIS — I5022 Chronic systolic (congestive) heart failure: Secondary | ICD-10-CM | POA: Diagnosis not present

## 2019-08-27 DIAGNOSIS — E039 Hypothyroidism, unspecified: Secondary | ICD-10-CM | POA: Diagnosis not present

## 2019-08-27 LAB — SODIUM: Sodium: 133 mmol/L — ABNORMAL LOW (ref 135–145)

## 2019-08-27 MED ORDER — LIDOCAINE 5 % EX PTCH
1.0000 | MEDICATED_PATCH | Freq: Every day | CUTANEOUS | Status: DC
  Administered 2019-08-29 – 2019-08-31 (×3): 1 via TRANSDERMAL
  Filled 2019-08-27 (×4): qty 1

## 2019-08-27 NOTE — Progress Notes (Signed)
Patient ID: [name], male   DOB: 1929-03-25, 86 y.o.   MRN: [id number]   Team Conference Report to Patient/Family  Team Conference discussion was reviewed with the patient and caregiver, including goals, any changes in plan of care and target discharge date.  Patient and caregiver express understanding and are in agreement.  The patient has a target discharge date of 09/02/19 (Home vs SNF).  Dyanne Iha 08/27/2019, 2:52 PM

## 2019-08-27 NOTE — Progress Notes (Signed)
Patient resting comfortably on home CPAP. No assistance needed from RT at this time. °

## 2019-08-27 NOTE — Progress Notes (Signed)
Patient resting comfortably home on CPAP unit.

## 2019-08-27 NOTE — Progress Notes (Signed)
[place] PHYSICAL MEDICINE & REHABILITATION PROGRESS NOTE  Subjective/Complaints: Patient seen laying in bed this morning.  He states he slept fairly overnight.  He states he feels better this morning.  He notes improvement in hip pain.  He was seen by nephrology yesterday, notes reviewed-no changes.  ROS: +SOB.  Denies CP, N/V/D  Objective: Vital Signs: Blood pressure 106/70, pulse 75, temperature 97.9 F (36.6 C), resp. rate 19, height 5\' 9"  (1.753 m), weight 55.4 kg, SpO2 98 %. No results found. Recent Labs    08/25/19 0454  WBC 8.8  HGB 8.5*  HCT 26.5*  PLT 398   Recent Labs    08/25/19 0454 08/25/19 1352 08/26/19 0643 08/27/19 0625  NA 129*   < > 132* 133*  K 4.2  --   --   --   CL 91*  --   --   --   CO2 31  --   --   --   GLUCOSE 89  --   --   --   BUN 20  --   --   --   CREATININE 0.88  --   --   --   CALCIUM 8.8*  --   --   --    < > = values in this interval not displayed.    Physical Exam: BP 106/70 (BP Location: Right Arm)   Pulse 75   Temp 97.9 F (36.6 C)   Resp 19   Ht 5\' 9"  (1.753 m)   Wt 55.4 kg   SpO2 98%   BMI 18.04 kg/m  Constitutional: No distress . Vital signs reviewed. HENT: Normocephalic.  Atraumatic. Eyes: EOMI. No discharge. Cardiovascular: No JVD. RRR. Respiratory: Normal effort.  No stridor. Bilaterally clear to auscultation. GI: Non-distended. BS+ Skin: Right AKA with dressing CDI. Psych: Normal mood.  Normal behavior. Musc: Right AKA with edema and tenderness, improving Coccyx ulcer not examined today LLE edema Neuro: Alert Extreme HOH Motor: Bilateral upper extremities: 4/5 proximal distally, unchanged Left lower extremity: 4-/5 proximal distal, unchanged Right lower extremity: Hip flexion, knee extension 3+/5 (some pain patient), stable  Assessment/Plan: 1. Functional deficits secondary to right AKA which require 3+ hours per day of interdisciplinary therapy in a comprehensive inpatient rehab setting.  Physiatrist  is providing close team supervision and 24 hour management of active medical problems listed below.  Physiatrist and rehab team continue to assess barriers to discharge/monitor patient progress toward functional and medical goals  Care Tool:  Bathing    Body parts bathed by patient: Right arm, Left arm, Chest, Abdomen, Front perineal area, Face (pr declined LB bathing)   Body parts bathed by helper: Left lower leg Body parts n/a: Buttocks, Right upper leg, Left upper leg, Right lower leg, Left lower leg   Bathing assist Assist Level: Minimal Assistance - Patient > 75%     Upper Body Dressing/Undressing Upper body dressing   What is the patient wearing?: Pull over shirt    Upper body assist Assist Level: Supervision/Verbal cueing    Lower Body Dressing/Undressing Lower body dressing      What is the patient wearing?: Underwear/pull up, Pants     Lower body assist Assist for lower body dressing: Moderate Assistance - Patient 50 - 74%     Toileting Toileting    Toileting assist Assist for toileting: Moderate Assistance - Patient 50 - 74%     Transfers Chair/bed transfer  Transfers assist     Chair/bed transfer assist level: Minimal Assistance -  Patient > 75% Chair/bed transfer assistive device: Museum/gallery exhibitions officer assist      Assist level: Minimal Assistance - Patient > 75% Assistive device: Walker-rolling Max distance: 3'   Walk 10 feet activity   Assist           Walk 50 feet activity   Assist           Walk 150 feet activity   Assist           Walk 10 feet on uneven surface  activity   Assist           Wheelchair     Assist               Wheelchair 50 feet with 2 turns activity    Assist            Wheelchair 150 feet activity     Assist            Medical Problem List and Plan: 1.  Impaired mobility and ADLs secondary to right AKA with significant medical  comorbidities  Continue CIR, 15/7  Team conference today to discuss current and goals and coordination of care, home and environmental barriers, and discharge planning with nursing, case manager, and therapies. Please see conference note from today as well.  2.  Antithrombotics: -DVT/anticoagulation:  Pharmaceutical: Heparin             -antiplatelet therapy: Aspirin 81mg  daily 3. Pain Management: Has residual limb pain--educate on desensitization techniques.   Norco will be discontinued. Continue tramadol   Lidoderm patch added for left heel on 8/9  Has headaches- could be secondary to caffeine withdrawal: edited diet order to include coffee with caffeine.    Apply warm compress 15 minutes three times per day (ordered)  Lidocaine jelly BID around coccyx sore  Voltaren gel to right hip ordered with improvement  Monitor with increased exertion. 4. Mood: Appears depressed--team to provide ego support. LCSW to follow for evaluation and support.   Will hold off on antidepressant given sodium levels. Discussed with wife  Wife may spend the night to assist with patient compliance and anxiety.  Xanax to .25mg  TID for anxiety, appears to be improving             -antipsychotic agents: N/A 5. Neuropsych: This patient may be capable of making decisions on his own behalf. Intermittently disoriented.  6. Skin/Wound Care:  Monitor wound for healing.    Stump shrinker for edema control   Coccyx injury: reposition frequently 7. Fluids/Electrolytes/Nutrition: Monitor I/Os 8.COPD/chronic hypoxic respiratory failure:   Continues to require supplemental oxygen (baseline)  Chest x-ray personally reviewed, mild effusion  ECG personally reviewed, showing premature complexes  Appreciate Pulm recs  Will ask pharm to educate  Need to continue to stress compliance 9. BPH/renal calculi:   Continue foley  Continue finasteride.  10. Anxiety d/o: continue Xanax tid prn. 11. Hyponatremia:   Sodium 133 on  8/18  Fluid restriction to 158mL.   Tolvaptan on hold per nephro.   Appreciate Nephro recs  See #15 12. ABLA:   Hemoglobin 8.5 on 8/16 13. Nasal congestion: Afrin nasal spray X 3 days.  Added saline spray as well as humidification.   14. HTN: Has history of orthostatic hypotension.  Monitor BP   Continue aldactone 12.5/25 mgQOD and Toprol XL.   Lasix restarted 20mg  daily as per nephrology  Controlled on 8/18  Monitor with increased exertion 15. CAD/Chronic systolic  CHF: Monitor for symptoms with increase in activity. On aldactone, toprol XL, ASA  and Lipitor Filed Weights   08/24/19 0500 08/26/19 0500 08/27/19 0500  Weight: 56.9 kg 57.2 kg 55.4 kg   Lasix restarted per Nephro, await further recs, may need to increase  Relatively stable on 8/18  16. Transaminitis: Resolved  LFTs WNL on 8/13  Continue to monitor 17.  Severe hypoalbuminemia  Supplement initiated  18.  Chronic sinusitis according to patient's wife has been seen at [facility] who recommended a 28-month course of azithromycin.    Azithromycin at 250 mg/day. Has Afrin spray as well.  19.  Hypothyroidism  Synthroid  LOS: 6 days A FACE TO FACE EVALUATION WAS PERFORMED  Karita Dralle Lorie Phenix 08/27/2019, 9:14 AM

## 2019-08-27 NOTE — Patient Care Conference (Signed)
Inpatient RehabilitationTeam Conference and Plan of Care Update Date: 08/27/2019   Time: 11:39 AM    Patient Name: [name]      Medical Record Number: [id number]  Date of Birth: [birth year] Sex: Male         Room/Bed: 4W05C/4W05C-01 Payor Info: Payor: MEDICARE / Plan: MEDICARE PART A AND B / Product Type: *No Product type* /    Admit Date/Time:  08/21/2019  3:13 PM  Primary Diagnosis:  <principal problem not specified>  Hospital Problems: Active Problems:   S/P AKA (above knee amputation) (HCC)   Post-operative pain   Chronic systolic congestive heart failure (HCC)   Hypothyroidism   Right hip pain   Pressure injury of skin of coccygeal region    Expected Discharge Date: Expected Discharge Date: 09/02/19 (Home vs SNF)  Team Members Present: Physician leading conference: Dr. Delice Lesch Care Coodinator Present: Dorien Chihuahua, RN, BSN, CRRN;Christina [place], BSW Nurse Present: Glenis Smoker, RN PT Present: Becky Sax, PT OT Present: [name], OT PPS Coordinator present : Ileana Ladd, Burna Mortimer, SLP     Current Status/Progress Goal Weekly Team Focus  Bowel/Bladder   Pt has Coude foley in place, LBM 08/25/19  Pt will maintain normal bowel pattern.  Assess toileting needs   Swallow/Nutrition/ Hydration             ADL's   Min/CGA overall, Min A sit<>stands, fatigues quickly  Supervision overall, CGA tub/shower transfers and sit > stand - will most likely need to downgrade all dynamic standing activities (bathing, LB dressing, toileting, transfers to CGA)  self-care retraining activity tolerance, residual limb care, sit<>stands, transfers   Mobility   bed mobility min/mod A, lateral scoot transfers min A, stand<>pivot transfers mod A, WC mobility 14ft supervision, car transfer min A +2  CGA, Mod I WC mobility  functional mobility/transfers, amputee education, discharge planning, generalized strengthening, dynamic sitting/standing balance/coordination,  improved activity tolerance.   Communication             Safety/Cognition/ Behavioral Observations            Pain   Pt c/o of R stump pain, PRN tylenol effective  Pain less than 5  Assess pain qshift/prn   Skin   R AKA incision, stage 2 on sacrum, pt refusing medicated cream, pt refuses to be turned q2h. Pt requested lidocain gel to be placed on wound  revent further breakdown and infection  Assess skin qshift/PRN, educate on proper skin [place] prevention     Discharge Planning:    TBD; home with wife/daughter vs SNF  Team Discussion: Shortness of breath and anxiety regarding oxygen, fatigue and need for cues to participate as well as encouragement of progress limits functional gains.   Patient on target to meet rehab goals: yes, currently at [facility] assist with CGA goals overall  *See Care Plan and progress notes for long and short-term goals.   Revisions to Treatment Plan:   Teaching Needs: Transfers, toileting, cues needed for ADLs, skin care/wound care and incision care.  Current Barriers to Discharge: Decreased caregiver support, home access limited, oxygen  Possible Resolutions to Barriers: SNF vs Hired assistance at home; wife given information regarding resources/options and to discuss with the patient and their daughter.     Medical Summary Current Status: Impaired mobility and ADLs secondary to right AKA with significant medical comorbidities  Barriers to Discharge: Medical stability;Other (comments);Decreased family/caregiver support  Barriers to Discharge Comments: SOB, hyponatremia Possible Resolutions to [employer] Focus: Therapies, nephro  recs, follow labs, optimize anxiety meds, follow weights   Continued Need for Acute Rehabilitation Level of Care: The patient requires daily medical management by a physician with specialized training in physical medicine and rehabilitation for the following reasons: Direction of a multidisciplinary physical  rehabilitation program to maximize functional independence : Yes Medical management of patient stability for increased activity during participation in an intensive rehabilitation regime.: Yes Analysis of laboratory values and/or radiology reports with any subsequent need for medication adjustment and/or medical intervention. : Yes   I attest that I was present, lead the team conference, and concur with the assessment and plan of the team.   Dorien Chihuahua B 08/27/2019, 4:50 PM

## 2019-08-27 NOTE — Progress Notes (Addendum)
Patient ID: [name], male   DOB: 25-Dec-1929, 86 y.o.   MRN: [id number]   Sw spoke with pt spouse. Pt will be transitioning to SNF upon discharge. We have discussed the need for pt to discharge to SNF asap. Will follow up tomorrow for facility decision.   Sw also informed spouse of current acceptances from: [facility], [place], [place] and [facility].   Also pt declines: [facility], [facility], [facility], [facility]

## 2019-08-27 NOTE — Progress Notes (Signed)
Occupational Therapy Session Note  Patient Details  Name: [name] MRN: [id number] Date of Birth: 1929/08/12  Today's Date: 08/27/2019 OT Individual Time: 1132-1207 OT Individual Time Calculation (min): 35 min    Short Term Goals: Week 1:  OT Short Term Goal 1 (Week 1): Pt will complete LB bathing sit to stand with min assist. OT Short Term Goal 2 (Week 1): Pt will complete LB dressing sit to stand with mod assist for 2 consecutive sessions. OT Short Term Goal 3 (Week 1): Pt will complete squat pivot or stand pivot transfer ot the BSC/drop arm commode with min assist. OT Short Term Goal 4 (Week 1): Pt will complete tolet hygiene and clothing management sit to stand with mod assist.  Skilled Therapeutic Interventions/Progress Updates:    Pt received in bed with family in room. [name] did request for him to change out of his pajamas into clothing.  Pt sat to EOB with S and doffed shirt, bathed and donned shirt with S.  For LB, sit to stand to RW with CGA and pt able to stand for a minute for therapist to doff and don pants over hips.  Therapist assisted with doffing and donning pants over L leg to manage with foley cath.  Pt donned over R leg.  Therapist donned limb sock.  Pt stood second time for close to 2 min with only CGA, but he was fatigued and needed to rest a few minutes.    Pt very tired at end of session and opted to lay in bed, supervision to move to supine. HOB elevated and pt set up with tray to prepare for lunch.   Therapy Documentation Precautions:  Precautions Precautions: Fall Precaution Comments: R AKA Restrictions Weight Bearing Restrictions: No RLE Weight Bearing: Non weight bearing Other Position/Activity Restrictions: On 2L O2   Pain: Pain Assessment Pain Score: 0-No pain   Therapy/Group: Individual Therapy  [place] 08/27/2019, 12:12 PM

## 2019-08-27 NOTE — Progress Notes (Signed)
Occupational Therapy Session Note  Patient Details  Name: [name] MRN: [id number] Date of Birth: [birth year]  Today's Date: 08/27/2019 OT Individual Time: 1000-1045 OT Individual Time Calculation (min): 45 min    Short Term Goals: Week 1:  OT Short Term Goal 1 (Week 1): Pt will complete LB bathing sit to stand with min assist. OT Short Term Goal 2 (Week 1): Pt will complete LB dressing sit to stand with mod assist for 2 consecutive sessions. OT Short Term Goal 3 (Week 1): Pt will complete squat pivot or stand pivot transfer ot the BSC/drop arm commode with min assist. OT Short Term Goal 4 (Week 1): Pt will complete tolet hygiene and clothing management sit to stand with mod assist.   Skilled Therapeutic Interventions/Progress Updates:  Pt in bed with dtr at bedside reporting she returned pt to bed level recently to elevate LLE due to swelling noticed in foot.  OT observed grade 2 pitting edema, nurse made aware.  Vitals assessed: BP 107/68; pulse 90; O2 99% on room air.  Pt requesting to use toilet for bowel movement.  Pt completed squat pivot transfer EOB to BSC with CGA.  Pt pushed up with BUE for max assist clothing mgt.  Pt had continent episode of bowel.  Pt attempted to push up with BUE, however left drop arm unlatched unexpectedly and pt sustained small skin tear on left lateral IF.  Nursing made aware and assessed and dressed this region.  Pt completed squat pivot to EOB with CGA.  Pt completed left lean for max assist pericare and clothing mgt.  Pt returned to supine with nursing present, call bell in reach, bed alarm on. Rehab director made aware of DME defect and BSC placed aside for further repair.  Pt demonstrated improved independence with squat pivot transfers today.    Therapy Documentation Precautions:  Precautions Precautions: Fall Precaution Comments: R AKA Restrictions Weight Bearing Restrictions: No RLE Weight Bearing: Non weight bearing Other Position/Activity  Restrictions: On 2L O2   Therapy/Group: Individual Therapy  Ezekiel Slocumb 08/27/2019, 3:54 PM

## 2019-08-27 NOTE — Progress Notes (Signed)
[facility] Progress Note   86y.o.maleCOPD oxygen dependent, BPH with foley for retention, PAF, HTN, CASHD s/p CAVG, PAD with chronic revascularizations leading to a Rt AKA by Dr. Scot Dock. We were consulted for hyponatremia.  Sodium as low as 119 during this admission requiring tolvaptan.  Work-up was most consistent with SIADH.  Signed off on 8/14.  Monitoring hyponatremia Assessment/ Plan:   1. Hyponatremia -mostly secondary to SIADH but some hypovolemia contributed in the past.  Urine sodium 25 and urine osmolality 369 with serum osmolality of 259 in the past.  TSH elevated to 12.2.  Hyponatremia is very stable and somewhat improved.  Given the stability we will sign off at this time.  Is likely that the patient's sodium will continue to fluctuate between 130 and 135.  As long as the sodium does not drop below 130 we do not need to change management at this time. 1. Continue volume restriction at least less than 1.5 L daily 2. Can monitor sodium twice weekly 3. Consider urea if the patient's sodium is lower than 130 4. Hypothyroidism management as below 2. Hypothyroidism: TSH 12.16 with normal T4 and low T3.  Continue Synthroid 25 MCG daily.  Possibly contributing to hyponatremia minimally 3. PAD s/p rt AKA 4. COPD on O2 and breathing treatments. 5. CHF (diastolic) 6. HTN 7. CKD3 (BL1-1.1): Serum creatinine lower than baseline at this time likely related to free water retention and dilution  Given the patient's stability we will sign off at this time.  Please notify us if we are needed in the future.  Subjective:   Patient doing well today without significant complaints.  Sodium was 133 today.   Objective:   BP 106/70 (BP Location: Right Arm)   Pulse 75   Temp 97.9 F (36.6 C)   Resp 19   Ht 5\' 9"  (1.753 m)   Wt 55.4 kg   SpO2 98%   BMI 18.04 kg/m   Intake/Output Summary (Last 24 hours) at 08/27/2019 1225 Last data filed at 08/27/2019 0617 Gross per 24 hour   Intake 230 ml  Output 1850 ml  Net -1620 ml   Weight change: -1.8 kg  Physical Exam: GEN: NAD, sitting in bed, no obvious distress HEENT: No conjunctival pallor, no nasal discharge, dry mouth LUNGS: Bilateral chest rise, no increased work of breathing CV: Normal rate ABD: Nondistended abdomen, no obvious masses or hernias JOA:CZYSA lower extremity edema at the ankles  Imaging: No results found.  Labs: BMET Recent Labs  Lab 08/21/19 0624 08/21/19 1057 08/21/19 1625 08/21/19 1625 08/22/19 0216 08/22/19 0216 08/22/19 0600 08/22/19 0857 08/22/19 1201 08/22/19 1201 08/22/19 1916 08/23/19 0702 08/24/19 0446 08/25/19 0454 08/25/19 1352 08/26/19 0643 08/27/19 0625  NA 125*   < > 127*   < > 132*   < > 134*   < > 131*   < > 130* 133* 132* 129* 130* 132* 133*  K 4.1  --  4.3  --  4.4  --  4.1  --  4.8  --  4.8  --   --  4.2  --   --   --   CL 88*  --  89*  --  93*  --  94*  --  93*  --  94*  --   --  91*  --   --   --   CO2 30  --  31  --  31  --  30  --  29  --  27  --   --  31  --   --   --   GLUCOSE 90  --  134*  --  92  --  94  --  127*  --  195*  --   --  89  --   --   --   BUN 14  --  14  --  14  --  13  --  13  --  17  --   --  20  --   --   --   CREATININE 0.84  --  0.95  --  0.87  --  0.85  --  0.87  --  0.95  --   --  0.88  --   --   --   CALCIUM 8.6*  --  8.8*  --  9.4  --  9.4  --  9.4  --  9.2  --   --  8.8*  --   --   --    < > = values in this interval not displayed.   CBC Recent Labs  Lab 08/21/19 0624 08/22/19 0600 08/25/19 0454  WBC 9.3 8.1 8.8  NEUTROABS  --  6.2  --   HGB 8.7* 8.4* 8.5*  HCT 26.0* 26.1* 26.5*  MCV 95.6 97.4 100.0  PLT 399 373 398    Medications:    . (feeding supplement) PROSource Plus  30 mL Oral BID BM  . aspirin EC  81 mg Oral QPC lunch  . atorvastatin  20 mg Oral QHS  . azithromycin  250 mg Oral Daily  . budesonide-formoterol  2 puff Inhalation BID  . Chlorhexidine Gluconate Cloth  6 each Topical Daily  .  diclofenac Sodium  2 g Topical QID  . enoxaparin (LOVENOX) injection  40 mg Subcutaneous Q24H  . feeding supplement (ENSURE ENLIVE)  237 mL Oral TID BM  . finasteride  5 mg Oral Q lunch  . fluticasone  2 spray Each Nare Daily  . furosemide  20 mg Oral Daily  . hydrocerin   Topical BID  . levothyroxine  25 mcg Oral Q0600  . [START ON 08/28/2019] lidocaine  1 patch Transdermal Daily  . lidocaine  1 application Topical Q94H0T  . melatonin  3 mg Oral QHS  . metoprolol succinate  12.5 mg Oral Daily  . multivitamin with minerals  1 tablet Oral QPC lunch  . pantoprazole  20 mg Oral Daily  . polyethylene glycol  17 g Oral Daily  . predniSONE  10 mg Oral Q breakfast  . sodium chloride  1 drop Both Eyes QID  . spironolactone  12.5 mg Oral QODAY  . spironolactone  25 mg Oral QODAY  . Tiotropium Bromide Monohydrate  2 puff Inhalation Daily  . vitamin B-12  1,000 mcg Oral QPC lunch      Reesa Chew  08/27/2019, 12:25 PM

## 2019-08-27 NOTE — Progress Notes (Signed)
Physical Therapy Session Note  Patient Details  Name: [name] MRN: [id number] Date of Birth: November 06, 1929  Today's Date: 08/27/2019 PT Individual Time: 2119-4174 PT Individual Time Calculation (min): 61 min   Short Term Goals: Week 1:  PT Short Term Goal 1 (Week 1): pt will perform bed mobility with supervision PT Short Term Goal 2 (Week 1): Pt will transfer sit<>stand with LRAD min A PT Short Term Goal 3 (Week 1): Pt will perform simulated car transfer with LRAD mod A  Skilled Therapeutic Interventions/Progress Updates:    Patient received sitting EOB noting increased fatigue, but agreeable to PT- he denies pain. Wife voiced concerns to PT about d/c date stating that she felt it was too soon and that patient was not ready to dc. PT notified treatment team of wife's concerns. PT discussed reasoning behind dc date and patients progress toward goals thus far including barriers to goals. Patients wife noted to PT that she would like patient to be roughly CGA prior to dc to SNF or home. Wife also asking PT to assess fit of shrinker sock. Upon assessment, sock appears to be too larger to patients RL at this time and so is rolling down/not staying up sufficiently throughout the day. Wife also requesting that foam bandage be placed on patients bottom given progression of pressure injuries. PT alerted RN to this request, however patient declined placement of bandage at this time. Wife and patient insisting that he drink more water- PT aware of fluid restriction in place- RN alerted to this request as well. Patient declined to participate in wc mobility at this time d/t fatigue- PT pushed patient in wc outdoors to allow for improved mood. Once back inside, patient able to complete STS with light CGA in order to allow PT to place [employer] cushion to assist with pressure relief. Patient able to remain standing for ~45s with SBA prior to sitting down. Patient able to stand once more with SBA and completed 3 hops  with CGA and MinA for walker management. Patient returned to room requesting to remain up in wc. Chair alarm in place, call light within reach, wife at bedside.   Therapy Documentation Precautions:  Precautions Precautions: Fall Precaution Comments: R AKA Restrictions Weight Bearing Restrictions: No RLE Weight Bearing: Non weight bearing Other Position/Activity Restrictions: On 2L O2    Therapy/Group: Individual Therapy  Karoline Caldwell, PT, DPT, CBIS 08/27/2019, 2:32 PM

## 2019-08-27 NOTE — Progress Notes (Signed)
Nutrition Follow-up  RD working remotely.  DOCUMENTATION CODES:   Underweight, suspect malnutrition but unable to confirm without NFPE  INTERVENTION:   - Continue liberalized Regular diet  - Continue Ensure Enlive po TID, each supplement provides 350 kcal and 20 grams of protein  - Continue ProSource Plus po BID, each supplement provides 100 kcal and 15 grams of protein  - Continue vanilla Greek yogurt TID with meals  - Continue MVI with minerals daily  NUTRITION DIAGNOSIS:   Increased nutrient needs related to wound healing, chronic illness (COPD) as evidenced by estimated needs.  Ongoing  GOAL:   Patient will meet greater than or equal to 90% of their needs  Progressing  MONITOR:   PO intake, Supplement acceptance, Labs, Weight trends, Skin  REASON FOR ASSESSMENT:   Malnutrition Screening Tool    ASSESSMENT:   86 year old male with PMH of COPD on 2L O2 at baseline, BPH with foley for retention, PAF, CAD s/p CABG, PVD with chronic nonhealing wound on right foot. Pt was admitted on 8/02 for right AKA. Admitted to CIR on 8/05. Pt transferred to acute care for hyponatremia then readmitted to CIR on 8/12.  Hyponatremia stable and somewhat improved per notes. Nephrology has signed off.  Diet liberalized to Regular; however, pt with 1500 ml fluid restriction given hyponatremia.  Unable to reach pt via phone call to room. Pt accepting ~75% of ProSource Plus and ~50% of Ensure Enlive supplements per [facility] documentation. Will continue with this supplement regimen.  Weight trending up over the last 5 days. Will continue to monitor. Suspect current weight is not consistent with pt's dry weight. Per RN edema assessment, pt with moderate pitting edema to BUE and mild pitting edema to LLE.  Meal Completion: 30-100% x last 8 meals (averaging 68%)  Medications reviewed and include: ProSource Plus BID, Ensure Enlive TID, lasix, MVI with minerals, protonix, miralax,  prednisone, spironolactone, vitamin B-12 1000 mcg daily  Labs reviewed: sodium 133, hemoglobin 8.5  UOP: 2400 ml x 24 hours I/O's: -7.9 L since admit  NUTRITION - FOCUSED PHYSICAL EXAM:  Unable to complete at this time. RD working remotely.  Diet Order:   Diet Order            Diet regular Room service appropriate? Yes; Fluid consistency: Thin; Fluid restriction: 1500 mL Fluid  Diet effective now                 EDUCATION NEEDS:   Not appropriate for education at this time  Skin:  Skin Assessment: Skin Integrity Issues: Stage II: coccyx, left foot Incisions: s/p right AKA Other: MASD to perineum  Last BM:  08/25/19  Height:   Ht Readings from Last 1 Encounters:  08/21/19 5\' 9"  (1.753 m)    Weight:   Wt Readings from Last 1 Encounters:  08/27/19 55.4 kg    Ideal Body Weight:  72.7 kg  BMI:  Body mass index is 18.04 kg/m.  Estimated Nutritional Needs:   Kcal:  1850-2050  Protein:  85-100 grams  Fluid:  1.8-2.0 L    Gaynell Face, MS, RD, LDN Inpatient Clinical Dietitian Please see AMiON for contact information.

## 2019-08-27 NOTE — Progress Notes (Signed)
Orthopedic Tech Progress Note Patient Details:  [name] 1929/09/08 [id number] Called in order to [facility] for 2X AKA SHRINKERS Patient ID: [name], male   DOB: August 08, 1929, 86 y.o.   MRN: [id number]   [name] 08/27/2019, 4:32 PM

## 2019-08-27 NOTE — Plan of Care (Signed)
  Problem: RH SKIN INTEGRITY Goal: RH STG SKIN FREE OF INFECTION/BREAKDOWN Description: Pt will be free of skin breakdown/infection with min assist prior to DC Outcome: Not Progressing; stage 2 bottom foam dressing change  Problem: RH BLADDER ELIMINATION Goal: RH STG MANAGE BLADDER WITH ASSISTANCE Description: STG Manage Bladder With min Assistance Outcome: Not Progressing; foley cath

## 2019-08-28 ENCOUNTER — Inpatient Hospital Stay (HOSPITAL_COMMUNITY): Payer: Medicare Other | Admitting: Occupational Therapy

## 2019-08-28 ENCOUNTER — Inpatient Hospital Stay (HOSPITAL_COMMUNITY): Payer: Medicare Other

## 2019-08-28 DIAGNOSIS — I5022 Chronic systolic (congestive) heart failure: Secondary | ICD-10-CM | POA: Diagnosis not present

## 2019-08-28 DIAGNOSIS — L89152 Pressure ulcer of sacral region, stage 2: Secondary | ICD-10-CM

## 2019-08-28 DIAGNOSIS — G8918 Other acute postprocedural pain: Secondary | ICD-10-CM | POA: Diagnosis not present

## 2019-08-28 DIAGNOSIS — I10 Essential (primary) hypertension: Secondary | ICD-10-CM

## 2019-08-28 DIAGNOSIS — Z89611 Acquired absence of right leg above knee: Secondary | ICD-10-CM | POA: Diagnosis not present

## 2019-08-28 MED ORDER — ZINC OXIDE 12.8 % EX OINT
TOPICAL_OINTMENT | CUTANEOUS | Status: DC | PRN
  Filled 2019-08-28: qty 56.7

## 2019-08-28 MED ORDER — FUROSEMIDE 20 MG PO TABS
20.0000 mg | ORAL_TABLET | Freq: Two times a day (BID) | ORAL | Status: DC
  Administered 2019-08-28 – 2019-09-01 (×8): 20 mg via ORAL
  Filled 2019-08-28 (×8): qty 1

## 2019-08-28 NOTE — Progress Notes (Signed)
Physical Therapy Session Note  Patient Details  Name: [name] MRN: [id number] Date of Birth: [birth year]  Today's Date: 08/28/2019 PT Individual Time: 5830-9407 PT Individual Time Calculation (min): 43 min   Short Term Goals: Week 1:  PT Short Term Goal 1 (Week 1): pt will perform bed mobility with supervision PT Short Term Goal 2 (Week 1): Pt will transfer sit<>stand with LRAD min A PT Short Term Goal 3 (Week 1): Pt will perform simulated car transfer with LRAD mod A  Skilled Therapeutic Interventions/Progress Updates:    Patient received upright in bed, agreeable to PT. He reports sleeping "terribly" due to delivery of new air mattress at midnight. He reports discomfort on the air mattress. PT educated patient on importance of utilizing air mattress to assist with wound healing/ prevention of pressure injuries. Patient and dtr verbalized understanding. Patient able to doff shirt sitting in bed independently and MinA for donning new shirt. Patient able to stand x2 for ~30s each time with CGA for doffing/donning new pants. PT applied new shrinker sock as well. Patient able to transfer to wc with stand pivot transfer with FWW and CGA. He is demonstrating increased confidence with his abilities, but does continue to require encouragement and verbal cues for safety. Patient able to hop x6 with FWW, CGA and wc follow for safety. He requires extended rest breaks between activities d/t decreased endurance. O2 remains >95% on 2L via [place] throughout therapy session. Patient remains up in chair with seatbelt alarm in place, call light within reach, dtr at bedside.   Therapy Documentation Precautions:  Precautions Precautions: Fall Precaution Comments: R AKA Restrictions Weight Bearing Restrictions: No RLE Weight Bearing: Non weight bearing Other Position/Activity Restrictions: On 2L O2    Therapy/Group: Individual Therapy  Karoline Caldwell, PT, DPT, CBIS 08/28/2019, 7:46 AM

## 2019-08-28 NOTE — Progress Notes (Signed)
Orthopedic Tech Progress Note Patient Details:  [name] 04-25-1929 [id number] Called [facility] Patient ID: [name], male   DOB: September 26, 1929, 86 y.o.   MRN: [id number]   [name] 08/28/2019, 10:29 AM

## 2019-08-28 NOTE — Progress Notes (Signed)
Physical Therapy Session Note  Patient Details  Name: [name] MRN: [id number] Date of Birth: [birth year]  Today's Date: 08/28/2019 PT Individual Time: 4210-3128 PT Individual Time Calculation (min): 44 min   Short Term Goals: Week 1:  PT Short Term Goal 1 (Week 1): pt will perform bed mobility with supervision PT Short Term Goal 2 (Week 1): Pt will transfer sit<>stand with LRAD min A PT Short Term Goal 3 (Week 1): Pt will perform simulated car transfer with LRAD mod A  Skilled Therapeutic Interventions/Progress Updates:    Patient received sitting up in wc, agreeable to PT. He denies pain, but endorses increased fatigue. Wife at bedside verbalizing questions regarding upcoming dc SNF vs home and how to make best informed decision. PT directed patients wife to SW/CM to help facilitate safe dc planning. Patient with new [employer] shoe to L LE-he denies any discomfort related to this shoe. He was able to complete 3 mins continuous on UE ergometer with improved core control noted sitting away from back of wc. Patient able to complete STS with FWW and CGA. He was able to remain standing with SBA + FWW for 1'15". He is improving his tolerance for functional mobility and is requiring less physical assistance, but does still require skilled assistance mostly related to patients anxiety with movement. Patient returned to bed via lateral scoot with CGA. Patient in bed with bed alarm on, call light within reach and wife at bedside.    Therapy Documentation Precautions:  Precautions Precautions: Fall Precaution Comments: R AKA Restrictions Weight Bearing Restrictions: Yes RLE Weight Bearing: Non weight bearing Other Position/Activity Restrictions: On 2L O2    Therapy/Group: Individual Therapy  Karoline Caldwell, PT, DPT, CBIS  08/28/2019, 2:24 PM

## 2019-08-28 NOTE — Progress Notes (Signed)
Pt c/o of air loss mattress bed being uncomfortable and causing him to "ache everywhere." explained the purpose of bed and the need to keep pressure off of coccyx and heel. Refusing to turn to side stating he can not breath in that position. Staff putting PRAFO boot on to keep heel off bed, but noted PRAFO on dresser few minutes later, and heel flat on the bed.  Pt agreeable to stay on right side for few minutes only around 1100 today. Pt is consistently refusing to keep foam dressing on sacrum stating it makes him itch. Explained to pt and spouse the need to have a cushion dressing on coccyx and keep wound covered at all times to facilitate healing.  Pt also refuses some of scheduled meds and taking inhalers as he wants to. Family with lots of questions and concerns. Linna Hoff, PA made aware. Continue plan of care.   Gerald Stabs, RN 08/28/2019 1:25 PM

## 2019-08-28 NOTE — Progress Notes (Signed)
Patient ID: ARIQ KHAMIS, male   DOB: 1929-06-04, 86 y.o.   MRN: [id number]   Sw went in to discuss SNF placement decision with spouse. Spouse states she wants to have an "inpatient rehabilitation evaluation". Sw has explained to spouse this is inpatient rehabilitation and evaluations were completed initially. Spouse states "she does not agree with set d/c date" and that she would like days promised. Sw explained to spouse the information she received previously is an estimated of expected days here on inpatient rehab. Proceeded to inform her the discharge date is determined by MD (medical stand point) and therapist (physcial standpoint, safety, etc). Spouse then proceeds to say she would like to "protest this", informed spouse that I would discuss with MD.   Sw followed up with MD. Spouse is unhappy, but agreed to set discharge date currently. SW informed spouse that we need to decide on one of her bed offers by today or we would need to start planning a discharge home of 8/24. Spouse informed sw she would like to accept bed at [facility]. Sw has informed AD of this information.

## 2019-08-28 NOTE — Progress Notes (Signed)
Occupational Therapy Session Note  Patient Details  Name: [name] MRN: [id number] Date of Birth: 02-Dec-1929  Today's Date: 08/28/2019 OT Individual Time: 7673-4193 OT Individual Time Calculation (min): 60 min    Short Term Goals: Week 1:  OT Short Term Goal 1 (Week 1): Pt will complete LB bathing sit to stand with min assist. OT Short Term Goal 2 (Week 1): Pt will complete LB dressing sit to stand with mod assist for 2 consecutive sessions. OT Short Term Goal 3 (Week 1): Pt will complete squat pivot or stand pivot transfer ot the BSC/drop arm commode with min assist. OT Short Term Goal 4 (Week 1): Pt will complete tolet hygiene and clothing management sit to stand with mod assist.  Skilled Therapeutic Interventions/Progress Updates:     Pt transferred from supine to sit EOB with supervision and then completed transfer from the bed to the wheelchair with min assist stand pivot using the RW for support.  He was then taken down to the ortho gym for BUE strengthening with use of the UE ergonometer.  He was able to complete 4 intervals of 2-3 mins each with resistance on level 8 and RPMs maintained at level 14-20.  Pt with increased fatigue and had to stop for the final 3 sets within 2-2.5 mins.  He was given rest breaks of 2-3 mins between each set.  Oxygen sats are 95-99% on 2Ls supplemental with HR in the low 90's.  Finished session with transfer back to the room via wheelchair and pt left sitting up in the wheelchair with call button and phone in reach.  Pt's spouse also in the room as well with SW in for discussion of discharge plan.    Therapy Documentation Precautions:  Precautions Precautions: Fall Precaution Comments: R AKA Restrictions Weight Bearing Restrictions: Yes RLE Weight Bearing: Non weight bearing Other Position/Activity Restrictions: On 2L O2  Pain: Pain Assessment Pain Scale: Faces Faces Pain Scale: Hurts little more Pain Type: Acute pain Pain Location:  Generalized (all over) Pain Descriptors / Indicators: Aching Pain Onset: On-going Pain Intervention(s): Repositioned ADL: See Care Tool Section for some details of mobility and selfcare  Therapy/Group: Individual Therapy  Kamill Fulbright OTR/L 08/28/2019, 12:14 PM

## 2019-08-28 NOTE — Progress Notes (Signed)
[place] PHYSICAL MEDICINE & REHABILITATION PROGRESS NOTE  Subjective/Complaints: Patient seen sitting up in bed this morning.  He states he did not sleep well overnight due to his bed.  When asked to wear his pocket talker, patient states that it is not necessary, but then cannot hear me.  ROS: + Shortness of breath.  Denies CP, N/V/D  Objective: Vital Signs: Blood pressure 117/64, pulse 98, temperature 98 F (36.7 C), temperature source Oral, resp. rate 20, height 5\' 9"  (1.753 m), weight 55.4 kg, SpO2 98 %. No results found. No results for input(s): WBC, HGB, HCT, PLT in the last 72 hours. Recent Labs    08/26/19 0643 08/27/19 0625  NA 132* 133*    Physical Exam: BP 117/64   Pulse 98   Temp 98 F (36.7 C) (Oral)   Resp 20   Ht 5\' 9"  (1.753 m)   Wt 55.4 kg   SpO2 98%   BMI 18.04 kg/m  Constitutional: No distress . Vital signs reviewed. HENT: Normocephalic.  Atraumatic. Eyes: EOMI. No discharge. Cardiovascular: No JVD.  RRR. Respiratory: Normal effort.  No stridor.  Bilateral clear to auscultation. GI: Non-distended.  BS +. Skin: Right AKA with dressing CDI Psych: Normal mood.  Normal behavior. Musc: Right AKA with edema and tenderness improving Coccyx ulcers not examined today Left heel ulcer Left lower extremity edema  Neuro: Alert Extreme HOH Motor: Bilateral upper extremities: 4/5 proximal distally, unchanged Left lower extremity: 4-/5 proximal distal, unchanged Right lower extremity: Hip flexion, knee extension 3+/5 (some pain patient), improving  Assessment/Plan: 1. Functional deficits secondary to right AKA which require 3+ hours per day of interdisciplinary therapy in a comprehensive inpatient rehab setting.  Physiatrist is providing close team supervision and 24 hour management of active medical problems listed below.  Physiatrist and rehab team continue to assess barriers to discharge/monitor patient progress toward functional and medical  goals  Care Tool:  Bathing    Body parts bathed by patient: Right arm, Left arm, Chest, Abdomen, Front perineal area, Face (UB only today)   Body parts bathed by helper: Left lower leg Body parts n/a: Buttocks, Right upper leg, Left upper leg, Right lower leg, Left lower leg   Bathing assist Assist Level: Supervision/Verbal cueing     Upper Body Dressing/Undressing Upper body dressing   What is the patient wearing?: Pull over shirt    Upper body assist Assist Level: Supervision/Verbal cueing    Lower Body Dressing/Undressing Lower body dressing      What is the patient wearing?: Underwear/pull up, Pants     Lower body assist Assist for lower body dressing: Moderate Assistance - Patient 50 - 74%     Toileting Toileting    Toileting assist Assist for toileting: Maximal Assistance - Patient 25 - 49%     Transfers Chair/bed transfer  Transfers assist     Chair/bed transfer assist level: Minimal Assistance - Patient > 75% Chair/bed transfer assistive device: Programmer, multimedia   Ambulation assist      Assist level: Minimal Assistance - Patient > 75% Assistive device: Walker-rolling Max distance: 3'   Walk 10 feet activity   Assist           Walk 50 feet activity   Assist           Walk 150 feet activity   Assist           Walk 10 feet on uneven surface  activity   Assist  Wheelchair     Assist               Wheelchair 50 feet with 2 turns activity    Assist            Wheelchair 150 feet activity     Assist            Medical Problem List and Plan: 1.  Impaired mobility and ADLs secondary to right AKA with significant medical comorbidities  Continue CIR, 15/7  Discussed with patient's wife discharge disposition.  Discussed discharge date, progress in therapies, discharge disposition-wife prefers patient to stay longer, however attempted to explain to wife rehab course.   Patient will need to go to SNF at discharge.  Wife is not happy, but accepting. 2.  Antithrombotics: -DVT/anticoagulation:  Pharmaceutical: Heparin             -antiplatelet therapy: Aspirin 81mg  daily 3. Pain Management: Has residual limb pain--educate on desensitization techniques.   Norco will be discontinued. Continue tramadol   Lidoderm patch added for left heel on 8/9  Has headaches- could be secondary to caffeine withdrawal: edited diet order to include coffee with caffeine.    Apply warm compress 15 minutes three times per day (ordered)  Lidocaine jelly BID around coccyx sore  Voltaren gel to right hip ordered with improvement  Improved  Monitor with increased exertion. 4. Mood: Appears depressed--team to provide ego support. LCSW to follow for evaluation and support.   Will hold off on antidepressant given sodium levels. Discussed with wife  Wife may spend the night to assist with patient compliance and anxiety.  Xanax to .25mg  TID for anxiety, appears to be improving             -antipsychotic agents: N/A 5. Neuropsych: This patient may be capable of making decisions on his own behalf. Intermittently disoriented.  6. Skin/Wound Care:  Monitor wound for healing.    Stump shrinker for edema control   Coccyx injury: reposition frequently 7. Fluids/Electrolytes/Nutrition: Monitor I/Os 8.COPD/chronic hypoxic respiratory failure:   Continues to require supplemental oxygen (baseline)  Chest x-ray personally reviewed, mild effusion  ECG personally reviewed, showing premature complexes  Appreciate Pulm recs  Will ask pharm to educate  Need to continue to stress compliance 9. BPH/renal calculi:   Continue foley  Continue finasteride.  10. Anxiety d/o: continue Xanax tid prn. 11. Hyponatremia:   Sodium 133 on 8/18, labs ordered for tomorrow  Fluid restriction to 1563mL.   Tolvaptan on hold per nephro.   Appreciate Nephro recs  See #15 12. ABLA:   Hemoglobin 8.5 on 8/16, labs  ordered for tomorrow 13. Nasal congestion: Afrin nasal spray X 3 days.  Added saline spray as well as humidification.   14. HTN: Has history of orthostatic hypotension.  Monitor BP   Continue aldactone 12.5/25 mgQOD and Toprol XL.   Lasix   Controlled on 8/19  Monitor with increased exertion 15. CAD/Chronic systolic CHF: Monitor for symptoms with increase in activity. On aldactone, toprol XL, ASA  and Lipitor Filed Weights   08/26/19 0500 08/27/19 0500 08/28/19 0534  Weight: 57.2 kg 55.4 kg 55.4 kg   Lasix restarted per Nephro, increased to BID on 8/19  Stable on 8/19  16. Transaminitis: Resolved  LFTs WNL on 8/13  Continue to monitor 17.  Severe hypoalbuminemia  Supplement initiated  18.  Chronic sinusitis according to patient's wife has been seen at [facility] who recommended a 48-month course of azithromycin.  Azithromycin at 250 mg/day. Has Afrin spray as well.  19.  Hypothyroidism  Synthroid  LOS: 7 days A FACE TO FACE EVALUATION WAS PERFORMED  Kennadee Walthour Lorie Phenix 08/28/2019, 12:23 PM

## 2019-08-28 NOTE — Progress Notes (Signed)
Pt able to do symbicort and BIPAP himself. Pt states he is capable of doing it himself. Pt refusing respiratory services at this time. Pt states he does not want respiratory to help him.RT will continue to monitor.

## 2019-08-28 NOTE — Consult Note (Signed)
[place] Nurse Consult Note: Patient receiving care in [facility] [phone]. Spouse at bedside. Primary RNs at the bedside also. Reason for Consult: PIs to coccyx and left heel Wound type: MASD to buttocks, healing DTPI to left heel Pressure Injury POA: Yes Measurement: left heel is now pink, the darkened overlying tissue having flaked off. Wound bed: pink Drainage (amount, consistency, odor) serous Periwound: intact Dressing procedure/placement/frequency:  Gently cleanse left lateral heel wound with soap and water. Pat dry. Place a small piece of Xeroform gauze Kellie Simmering [phone]) over the wound, then a dry gauze. Secure with a few turns of kerlex. Change daily. I have ordered Triple paste for the MASD on the buttocks/coccyx area. To be applied at least twice daily. I recommend using a prevalon boot Kellie Simmering [phone]) on the left foot rather than the rigid prafo boot.  However, this will need to be discussed by the primary nursing staff and the provider. Monitor the wound area(s) for worsening of condition such as: Signs/symptoms of infection,  Increase in size,  Development of or worsening of odor, Development of pain, or increased pain at the affected locations.  Notify the medical team if any of these develop.  Thank you for the consult.  Discussed plan of care with the patient and bedside nurse.  [place] nurse will not follow at this time.  Please re-consult the [place] team if needed.  Val Riles, RN, MSN, CWOCN, CNS-BC, pager [phone]

## 2019-08-29 ENCOUNTER — Inpatient Hospital Stay (HOSPITAL_COMMUNITY): Payer: Medicare Other

## 2019-08-29 ENCOUNTER — Inpatient Hospital Stay (HOSPITAL_COMMUNITY): Payer: Medicare Other | Admitting: Occupational Therapy

## 2019-08-29 DIAGNOSIS — F411 Generalized anxiety disorder: Secondary | ICD-10-CM

## 2019-08-29 DIAGNOSIS — D72829 Elevated white blood cell count, unspecified: Secondary | ICD-10-CM

## 2019-08-29 DIAGNOSIS — L89622 Pressure ulcer of left heel, stage 2: Secondary | ICD-10-CM

## 2019-08-29 DIAGNOSIS — R0989 Other specified symptoms and signs involving the circulatory and respiratory systems: Secondary | ICD-10-CM

## 2019-08-29 LAB — BASIC METABOLIC PANEL
Anion gap: 10 (ref 5–15)
BUN: 22 mg/dL (ref 8–23)
CO2: 31 mmol/L (ref 22–32)
Calcium: 9.4 mg/dL (ref 8.9–10.3)
Chloride: 89 mmol/L — ABNORMAL LOW (ref 98–111)
Creatinine, Ser: 0.95 mg/dL (ref 0.61–1.24)
GFR calc Af Amer: >60 mL/min (ref >60)
GFR calc non Af Amer: >60 mL/min (ref >60)
Glucose, Bld: 152 mg/dL — ABNORMAL HIGH (ref 70–99)
Potassium: 3.8 mmol/L (ref 3.5–5.1)
Sodium: 130 mmol/L — ABNORMAL LOW (ref 135–145)

## 2019-08-29 LAB — CBC WITH DIFFERENTIAL/PLATELET
Abs Immature Granulocytes: 0.08 10*3/uL — ABNORMAL HIGH (ref 0.00–0.07)
Basophils Absolute: 0.1 10*3/uL (ref 0.0–0.1)
Basophils Relative: 1 %
Eosinophils Absolute: 0.1 10*3/uL (ref 0.0–0.5)
Eosinophils Relative: 1 %
HCT: 29.9 % — ABNORMAL LOW (ref 39.0–52.0)
Hemoglobin: 9.5 g/dL — ABNORMAL LOW (ref 13.0–17.0)
Immature Granulocytes: 1 %
Lymphocytes Relative: 7 %
Lymphs Abs: 0.8 10*3/uL (ref 0.7–4.0)
MCH: 32 pg (ref 26.0–34.0)
MCHC: 31.8 g/dL (ref 30.0–36.0)
MCV: 100.7 fL — ABNORMAL HIGH (ref 80.0–100.0)
Monocytes Absolute: 1.1 10*3/uL — ABNORMAL HIGH (ref 0.1–1.0)
Monocytes Relative: 10 %
Neutro Abs: 9.3 10*3/uL — ABNORMAL HIGH (ref 1.7–7.7)
Neutrophils Relative %: 80 %
Platelets: 381 10*3/uL (ref 150–400)
RBC: 2.97 MIL/uL — ABNORMAL LOW (ref 4.22–5.81)
RDW: 13.4 % (ref 11.5–15.5)
WBC: 11.4 10*3/uL — ABNORMAL HIGH (ref 4.0–10.5)
nRBC: 0 % (ref 0.0–0.2)

## 2019-08-29 MED ORDER — POTASSIUM CHLORIDE CRYS ER 20 MEQ PO TBCR
40.0000 meq | EXTENDED_RELEASE_TABLET | Freq: Every day | ORAL | Status: DC
  Administered 2019-08-29 – 2019-09-01 (×4): 40 meq via ORAL
  Filled 2019-08-29 (×5): qty 2

## 2019-08-29 MED ORDER — ALPRAZOLAM 0.25 MG PO TABS
0.2500 mg | ORAL_TABLET | Freq: Two times a day (BID) | ORAL | Status: DC | PRN
  Administered 2019-08-29 – 2019-09-01 (×6): 0.25 mg via ORAL
  Filled 2019-08-29 (×6): qty 1

## 2019-08-29 NOTE — Progress Notes (Addendum)
[place] PHYSICAL MEDICINE & REHABILITATION PROGRESS NOTE  Subjective/Complaints: Patient seen sitting up at the edge of the bed this morning.  Good sitting balance noted.  Daughter at bedside.  Patient states he slept fairly overnight.  Patient and daughter with questions regarding catheterization.  Patient states he would like to hold off on removing Foley at present.  Patient also dates he feels dissociated and would like to decrease his Xanax.  Educated on anxiety.  Patient also states that he had a bowel movement 2 days ago.  ROS: + Shortness of breath.  Denies CP, N/V/D  Objective: Vital Signs: Blood pressure (!) 88/60, pulse 76, temperature 98.8 F (37.1 C), temperature source Oral, resp. rate 17, height 5\' 9"  (1.753 m), weight 55.4 kg, SpO2 99 %. No results found. Recent Labs    08/29/19 0843  WBC 11.4*  HGB 9.5*  HCT 29.9*  PLT 381   Recent Labs    08/27/19 0625 08/29/19 0843  NA 133* 130*  K  --  3.8  CL  --  89*  CO2  --  31  GLUCOSE  --  152*  BUN  --  22  CREATININE  --  0.95  CALCIUM  --  9.4    Physical Exam: BP (!) 88/60 (BP Location: Right Arm)   Pulse 76   Temp 98.8 F (37.1 C) (Oral)   Resp 17   Ht 5\' 9"  (1.753 m)   Wt 55.4 kg   SpO2 99%   BMI 18.04 kg/m  Constitutional: No distress . Vital signs reviewed.  Frail. HENT: Normocephalic.  Atraumatic. Eyes: EOMI. No discharge. Cardiovascular: No JVD.  RRR. Respiratory: Normal effort.  No stridor.  Bilaterally clear to auscultation.  On [place]. GI: Non-distended.  BS +. Skin: Right AKA with staples CDI Coccyx stage II ulcer not examined today Left heel stage II with mild serosanguineous drainage Psych: Normal mood.  Normal behavior. Musc: Right AKA with improving edema and tenderness Left lower extremity edema improving Neuro: Alert Extreme HOH Motor: Bilateral upper extremities: 4/5 proximal distally, unchanged Left lower extremity: 4-/5 proximal distal, unchanged Right lower extremity: Hip  flexion, knee extension 3+/5 (some pain patient), improving  Assessment/Plan: 1. Functional deficits secondary to right AKA which require 3+ hours per day of interdisciplinary therapy in a comprehensive inpatient rehab setting.  Physiatrist is providing close team supervision and 24 hour management of active medical problems listed below.  Physiatrist and rehab team continue to assess barriers to discharge/monitor patient progress toward functional and medical goals  Care Tool:  Bathing    Body parts bathed by patient: Right arm, Left arm, Chest, Abdomen, Front perineal area, Face (UB only today)   Body parts bathed by helper: Left lower leg Body parts n/a: Buttocks, Right upper leg, Left upper leg, Right lower leg, Left lower leg   Bathing assist Assist Level: Supervision/Verbal cueing     Upper Body Dressing/Undressing Upper body dressing   What is the patient wearing?: Pull over shirt    Upper body assist Assist Level: Supervision/Verbal cueing    Lower Body Dressing/Undressing Lower body dressing      What is the patient wearing?: Underwear/pull up, Pants     Lower body assist Assist for lower body dressing: Moderate Assistance - Patient 50 - 74%     Toileting Toileting    Toileting assist Assist for toileting: Maximal Assistance - Patient 25 - 49%     Transfers Chair/bed transfer  Transfers assist  Chair/bed transfer assist level: Minimal Assistance - Patient > 75% Chair/bed transfer assistive device: Museum/gallery exhibitions officer assist      Assist level: Minimal Assistance - Patient > 75% Assistive device: Walker-rolling Max distance: 3'   Walk 10 feet activity   Assist           Walk 50 feet activity   Assist           Walk 150 feet activity   Assist           Walk 10 feet on uneven surface  activity   Assist           Wheelchair     Assist               Wheelchair 50 feet with 2  turns activity    Assist            Wheelchair 150 feet activity     Assist            Medical Problem List and Plan: 1.  Impaired mobility and ADLs secondary to right AKA with significant medical comorbidities  Continue CIR, 15/7  Discussed with patient's wife discharge disposition.  Discussed discharge date, progress in therapies, discharge disposition-wife prefers patient to stay longer, however attempted to explain to wife rehab course.  Patient will need to go to SNF at discharge.  Wife is not happy, but accepting. 2.  Antithrombotics: -DVT/anticoagulation:  Pharmaceutical: Heparin             -antiplatelet therapy: Aspirin 81mg  daily 3. Pain Management: Has residual limb pain--educate on desensitization techniques.   Norco will be discontinued. Continue tramadol   Lidoderm patch added for left heel on 8/9  Has headaches- could be secondary to caffeine withdrawal: edited diet order to include coffee with caffeine.    Apply warm compress 15 minutes three times per day (ordered)  Lidocaine jelly BID around coccyx sore  Voltaren gel to right hip ordered with improvement  Controlled with meds on 8/20  Monitor with increased exertion. 4. Mood: Appears depressed--team to provide ego support. LCSW to follow for evaluation and support.   Will hold off on antidepressant given sodium levels. Discussed with wife  Wife may spend the night to assist with patient compliance and anxiety.  Xanax to .25mg  TID for anxiety, educated patient, decreased to twice daily on 8/20             -antipsychotic agents: N/A 5. Neuropsych: This patient may be capable of making decisions on his own behalf. Intermittently disoriented.  6. Skin/Wound Care:  Monitor wound for healing.    Stump shrinker for edema control   Coccyx injury: reposition frequently 7. Fluids/Electrolytes/Nutrition: Monitor I/Os 8.COPD/chronic hypoxic respiratory failure:   Continues to require supplemental oxygen  (baseline)  Chest x-ray personally reviewed, mild effusion  ECG personally reviewed, showing premature complexes  Appreciate Pulm recs  Has been educated by pharmacy  Need to continue to stress compliance 9. BPH/renal calculi:   Continue foley  Continue finasteride.  10. Anxiety d/o: continue Xanax tid prn. 11. Hyponatremia:   Sodium 130 on 8/20, labs ordered for tomorrow  Fluid restriction to 1529mL.   Tolvaptan on hold per nephro.   Appreciate Nephro recs, signed off, may need to reconsult  See #15 12. ABLA:   Hemoglobin 9.5 on 8/20 13. Nasal congestion: Afrin nasal spray X 3 days.  Added saline spray as well as humidification.   14. HTN:  Has history of orthostatic hypotension.  Monitor BP   Continue aldactone 12.5/25 mgQOD and Toprol XL.   Lasix   Labile on 8/20, monitor for trend  Monitor with increased exertion 15. CAD/Chronic systolic CHF: Monitor for symptoms with increase in activity. On aldactone, toprol XL, ASA  and Lipitor Filed Weights   08/26/19 0500 08/27/19 0500 08/28/19 0534  Weight: 57.2 kg 55.4 kg 55.4 kg   Lasix restarted per Nephro, increased to BID on 8/19  Stable on 8/19, no weights for today 16. Transaminitis: Resolved  LFTs WNL on 8/13  Continue to monitor 17.  Severe hypoalbuminemia  Supplement initiated  18.  Chronic sinusitis according to patient's wife has been seen at [facility] who recommended a 70-month course of azithromycin.    Azithromycin at 250 mg/day. Has Afrin spray as well.  19.  Hypothyroidism  Synthroid 20.  Leukocytosis  WBCs 11.4 on 8/20, labs ordered for Monday  Afebrile  LOS: 8 days A FACE TO FACE EVALUATION WAS PERFORMED  Derrisha Foos Lorie Phenix 08/29/2019, 10:09 AM

## 2019-08-29 NOTE — Progress Notes (Signed)
Occupational Therapy Weekly Progress Note  Patient Details  Name: [name] MRN: [id number] Date of Birth: 10/04/1929  Beginning of progress report period: August 22, 2019 End of progress report period: August 29, 2019  Today's Date: 08/29/2019 OT Individual Time: 3235-5732 OT Individual Time Calculation (min): 69 min    Patient has met 3 of 4 short term goals.  Pt is making steady progress towards goals, however requires max encouragement and reassurance during self-care tasks and mobility.  Pt is currently able to complete squat pivot transfers min assist to CGA and min assist stand pivot with RW.  Pt complete sit > stand CGA to min assist with max encouragement.  Pt continues to require increased assistance with LB dressing and hygiene due to fearfulness in standing.    Patient continues to demonstrate the following deficits: muscle weaknessand decreased cardiorespiratoy endurance and decreased oxygen support and therefore will continue to benefit from skilled OT intervention to enhance overall performance with BADL and Reduce care partner burden.  Patient progressing toward long term goals..  Continue plan of care.  OT Short Term Goals Week 1:  OT Short Term Goal 1 (Week 1): Pt will complete LB bathing sit to stand with min assist. OT Short Term Goal 1 - Progress (Week 1): Met OT Short Term Goal 2 (Week 1): Pt will complete LB dressing sit to stand with mod assist for 2 consecutive sessions. OT Short Term Goal 2 - Progress (Week 1): Met OT Short Term Goal 3 (Week 1): Pt will complete squat pivot or stand pivot transfer ot the BSC/drop arm commode with min assist. OT Short Term Goal 3 - Progress (Week 1): Met OT Short Term Goal 4 (Week 1): Pt will complete tolet hygiene and clothing management sit to stand with mod assist. OT Short Term Goal 4 - Progress (Week 1): Progressing toward goal Week 2:  OT Short Term Goal 1 (Week 2): Pt will complete toilet hygiene and clothing management  sit to stand with mod assist. OT Short Term Goal 2 (Week 2): Pt will complete LB dressing (with exception of footwear) with min assist at sit > stand level OT Short Term Goal 3 (Week 2): Pt will complete toilet transfers with min assist with <2 cues for sequencing/motor planning  Skilled Therapeutic Interventions/Progress Updates:    Treatment session with focus on functional transfers, sit > stand, dynamic standing balance, and endurance during self-care tasks.  Pt received upright in bed agreeable to completing bathing/dressing at sink. Therapist encouraged pt to complete transfer to Rt side of bed, due to set up of equipment.  Pt required max encouragement and demonstration as pt fearful of transferring to his Lt.  Demonstrated that pt has completed transfers both Rt and Lt with going to/from bed and BSC.  Max multimodal cues for sequencing, with pt ultimately able to complete transfer with Min assist.  Pt stating "I don't know why, but that wasn't too bad".  Engaged in hair washing seated at sink with pt drying hair with use of hair dryer for endurance.  Engaged in LB bathing and dressing with pt completing sit > stand x3 with CGA and then min assist for standing balance while completing bathing or clothing management.  Pt required assistance with donning pants due to fatigue.  Pt remained upright in w/c with seat belt alarm on and wife present.    Therapy Documentation Precautions:  Precautions Precautions: Fall Precaution Comments: R AKA Restrictions Weight Bearing Restrictions: Yes RLE Weight Bearing: Non  weight bearing Other Position/Activity Restrictions: On 2L O2 Pain:   Pt with no c/o pain   Therapy/Group: Individual Therapy  [name] 08/29/2019, 8:17 AM

## 2019-08-29 NOTE — Progress Notes (Signed)
Coude foley catheter changed without difficulty, pt  tolerated procedure well. Foley catheter draining yellow clear urin

## 2019-08-29 NOTE — Progress Notes (Signed)
Physical Therapy Session Note  Patient Details  Name: [name] MRN: [id number] Date of Birth: Mar 26, 1929  Today's Date: 08/29/2019 PT Individual Time: 1281-1886 PT Individual Time Calculation (min): 57 min   Short Term Goals: Week 1:  PT Short Term Goal 1 (Week 1): pt will perform bed mobility with supervision PT Short Term Goal 2 (Week 1): Pt will transfer sit<>stand with LRAD min A PT Short Term Goal 3 (Week 1): Pt will perform simulated car transfer with LRAD mod A  Skilled Therapeutic Interventions/Progress Updates:    Patient received sitting EOB, agreeable to PT, wife at bedside. Wife stating that she brought in [place] from home asking if that was what the [place] recommended. According to notes, that is the correct boot. Wife requesting that she be able to take [facility] boot home to allow staff to only use [place] boot on patient. Patient reporting increased SOB and difficulty "getting a full breath in" when seated EOB. Requesting to use his inhaler. Per wife, he last used it at around 11:15am with previous therapist. Patient stating that he knows he should take it every 4 hours. Wife recommending that patient use inhaler at beginning of session (~2:20pm). Patient administered inhaler himself. He is able to transfer bed > wc with SPT CGA. He was able to hop 55ft x2 with CGA and extensive encouragement. Throughout session, patient was very teary eyed asking PTs if we would "help (him) when he stopped breathing." PT attempting to address patients concern/anxiety, but redirect him to task. PT provided patient with visualization tools to help minimize effects of anxiety with mild success. Patient lateral scooted to bed with SBA. Patient left in bed at end of session, call light within reach, wife at bedside. Once back in his room, patient appeared to be less anxious.   Therapy Documentation Precautions:  Precautions Precautions: Fall Precaution Comments: R AKA Restrictions Weight Bearing  Restrictions: Yes RLE Weight Bearing: Non weight bearing Other Position/Activity Restrictions: On 2L O2   Therapy/Group: Individual Therapy  Karoline Caldwell, PT, DPT, CBIS 08/29/2019, 7:55 AM

## 2019-08-29 NOTE — Progress Notes (Signed)
Occupational Therapy Discharge Summary  Patient Details  Name: [name] MRN: [id number] Date of Birth: 01-17-1929   Patient has met 6 of 10 long term goals due to improved activity tolerance, improved balance and postural control.  Patient to discharge at [facility] Assist  level (fluctuates/inconsistent).  Patient's care partner unavailable to provide the necessary physical assistance at discharge.  Patient's wife has been present for majority of therapy sessions, however is unable to provide the level of assistance pt will require.  Therefore pt to d/c to SNF for further therapy. Pt currently requires Min with LB dressing (excluding shoes) and LB bathing with compensatory techniques and extended time. Mod A with toileting tasks for clothing management on BSC/toilet.   Reasons goals not met: Pt is unable to complete LB dressing from a standing level, requiring lateral leans in sitting and/or bridging at bed level. Pt continues to require assistance with toileting tasks with clothing management d/t fatigue and generalized weakness. Fatigue and decreased endurance limiting.   Recommendation:  Patient will benefit from ongoing skilled OT services in skilled nursing facility setting to continue to advance functional skills in the area of BADL and Reduce care partner burden.  Equipment: No equipment provided  Reasons for discharge: treatment goals met and discharge from hospital  Patient/family agrees with progress made and goals achieved: Yes  OT Discharge Precautions/Restrictions  Precautions Precautions: Fall Precaution Comments: R AKA Restrictions Weight Bearing Restrictions: Yes RLE Weight Bearing: Non weight bearing Other Position/Activity Restrictions: On 2L O2 Pain Pain Assessment Pain Scale: 0-10 Pain Score: 0-No pain ADL ADL Eating: Set up Upper Body Bathing: Supervision/safety Where Assessed-Upper Body Bathing: Bed level Lower Body Bathing: Minimal assistance Where  Assessed-Lower Body Bathing: Bed level Upper Body Dressing: Setup Lower Body Dressing: Minimal assistance Where Assessed-Lower Body Dressing: Bed level Toileting: Moderate assistance Toilet Transfer: Therapist, music Method: Stand pivot Toilet Transfer Equipment: Drop arm bedside commode Vision Baseline Vision/History: Wears glasses Wears Glasses: At all times Patient Visual Report: No change from baseline Perception  Perception: Within Functional Limits Praxis Praxis: Intact Cognition Overall Cognitive Status: Within Functional Limits for tasks assessed Arousal/Alertness: Awake/alert Orientation Level: Disoriented to time;Oriented to person;Oriented to place;Oriented to situation Memory: Appears intact Awareness: Appears intact Problem Solving: Impaired Safety/Judgment: Appears intact Comments: pt with significant fear of falling Sensation Sensation Light Touch: Impaired by gross assessment Proprioception: Impaired by gross assessment Additional Comments: decreased along incision on Rt residual limb and along Lt great toe Coordination Gross Motor Movements are Fluid and Coordinated: No Fine Motor Movements are Fluid and Coordinated: Yes Coordination and Movement Description: grossly uncoordinated due to R AKA, decreased balance/postural control, generalized weakness, poor endurance, and fatigue Finger Nose Finger Test: [facility] but slow Motor  Motor Motor: Abnormal postural alignment and control Motor - Skilled Clinical Observations: grossly uncoordinated due to R AKA, decreased balance/postural control, generalized weakness, poor endurance, fatigue, and fear of falling Mobility  Bed Mobility Bed Mobility: Supine to Sit;Sit to Supine Supine to Sit: Supervision/Verbal cueing Sit to Supine: Supervision/Verbal cueing  Trunk/Postural Assessment  Cervical Assessment Cervical Assessment: Exceptions to [facility] (forward head) Thoracic Assessment Thoracic Assessment:  Exceptions to [facility] (kyphosis) Lumbar Assessment Lumbar Assessment: Exceptions to [facility] (posterior pelvic tilt) Postural Control Postural Control: Deficits on evaluation  Balance Balance Balance Assessed: Yes Static Sitting Balance Static Sitting - Balance Support: Bilateral upper extremity supported;Feet supported Static Sitting - Level of Assistance: 6: Modified independent (Device/Increase time) Dynamic Sitting Balance Dynamic Sitting - Balance Support: Bilateral  upper extremity supported;Feet unsupported Dynamic Sitting - Level of Assistance: 6: Modified independent (Device/Increase time) Static Standing Balance Static Standing - Balance Support: Bilateral upper extremity supported (RW) Static Standing - Level of Assistance: 5: Stand by assistance (CGA) Dynamic Standing Balance Dynamic Standing - Balance Support: Bilateral upper extremity supported (RW) Dynamic Standing - Level of Assistance: 5: Stand by assistance (CGA) Extremity/Trunk Assessment RUE Assessment RUE Assessment: Within Functional Limits LUE Assessment LUE Assessment: Within Functional Limits   Curtez Brallier, [facility] 08/29/2019, 3:53 PM

## 2019-08-29 NOTE — Progress Notes (Signed)
Physical Therapy Session Note  Patient Details  Name: [name] MRN: [id number] Date of Birth: Aug 21, 1929  Today's Date: 08/29/2019 PT Individual Time: 1115-1200 PT Individual Time Calculation (min): 45 min   Short Term Goals: Week 1:  PT Short Term Goal 1 (Week 1): pt will perform bed mobility with supervision PT Short Term Goal 2 (Week 1): Pt will transfer sit<>stand with LRAD min A PT Short Term Goal 3 (Week 1): Pt will perform simulated car transfer with LRAD mod A Week 2:    Week 3:     Skilled Therapeutic Interventions/Progress Updates:    PAIN denies pain this am  Pt initially oob in wc w/wife at side. Pt provided w/02tank, 02 @2L /min via [place] thru session.    Pt transported to hall and propells wc total distance of 161ft w/two rest breaks, cues for increased effort w/RUE to maintain straight path.  Educated re: effect of altered wt distribution w/wc propulsion.  Pt transported to gym.   STS in parallel bars w/cga to min assist.  Gait 5ft x 2 in parallel bars w/cga, very good ability to perform press and sway w/minimal impact force to RLE.  Pt rested 3-4 min between gait efforts.    At end of session, pt transported back to room.  Discussed progress w/wife who was very pleased to hear that pt has been ambulating in PT sessions. Pt left oob in wc w/alarm belt set and needs in reach   Therapy Documentation Precautions:  Precautions Precautions: Fall Precaution Comments: R AKA Restrictions Weight Bearing Restrictions: Yes RLE Weight Bearing: Non weight bearing Other Position/Activity Restrictions: On 2L O2    Therapy/Group: Individual Therapy  Callie Fielding, [place] 08/29/2019, 12:56 PM

## 2019-08-29 NOTE — Progress Notes (Signed)
Physical Therapy Weekly Progress Note  Patient Details  Name: [name] MRN: [id number] Date of Birth: 05/12/1929  Beginning of progress report period: August 22, 2019 End of progress report period: August 29, 2019  Patient has met 3 of 3 short term goals. Patient is making slow, but steady progress toward his LTGs. He currently requires SBA and verbal cues for bed mobility, CGA for STS, CGA for SPT, SBA for lateral scoot transfers and CGA for hopping up to 83f with FWW. He remains SPV for wc mobility. Patient is grossly limited by frequent episodes of SOB and anxiety. His O2 saturation is stable at >95% O2 on 2L via [place] throughout therapy sessions. Patient remains 15/7 with planned dc to SNF.   Patient continues to demonstrate the following deficits muscle weakness, decreased cardiorespiratoy endurance and decreased oxygen support, unbalanced muscle activation and decreased coordination and decreased sitting balance, decreased standing balance, decreased postural control and decreased balance strategies and therefore will continue to benefit from skilled PT intervention to increase functional independence with mobility.  Patient progressing toward long term goals..  Continue plan of care.  PT Short Term Goals Week 1:  PT Short Term Goal 1 (Week 1): pt will perform bed mobility with supervision PT Short Term Goal 2 (Week 1): Pt will transfer sit<>stand with LRAD min A PT Short Term Goal 3 (Week 1): Pt will perform simulated car transfer with LRAD mod A    Therapy Documentation Precautions:  Precautions Precautions: Fall Precaution Comments: R AKA Restrictions Weight Bearing Restrictions: Yes RLE Weight Bearing: Non weight bearing Other Position/Activity Restrictions: On 2L O2  JDebbora Dus8/20/2021, 7:56 AM

## 2019-08-29 NOTE — Progress Notes (Signed)
Physical Therapy Discharge Summary  Patient Details  Name: [name] MRN: [id number] Date of Birth: [birth year]  Patient has met 7 of 8 long term goals due to improved activity tolerance, improved balance, improved postural control, increased strength, improved awareness and improved coordination. Patient to discharge at a wheelchair level Supervision. Patient's care partner is unavailable to provide the necessary physical assistance at discharge therefore pt will be discharging to SNF.   Patient made slow progress toward his STG and LTG- he was limited mainly by fatigue, SOB and anxiety. Family present throughout stay and receptive to verbal education and demonstration, but they did not engage in any hands on transfer/gait training. Family educated on proper RL care, fit of shrinker sock, how to done shrinker sock, care for L LE as well. Patient to dc to SNF d/t limited availability of physical support from family.   Reasons goals not met: Patient was limited in his ability to meet all of his LTGs due to increased fatigue/lethargy and SOB throughout therapy sessions. He requires multiple, frequent and prolonged rest breaks throughout therapy sessions in order to complete necessary tasks. He did not meet his LTG of completing a car transfers with CGA and is discharging at a level of ModA x1 for this task.   Recommendation:  Patient will benefit from ongoing skilled PT services in skilled nursing facility setting to continue to advance safe functional mobility, address ongoing impairments in transfers, limb loss education, generalized strengthening, dynamic standing balance/coordination, endurance, and to minimize fall risk.  Equipment: No equipment provided  Reasons for discharge: treatment goals met and discharge from hospital  Patient/family agrees with progress made and goals achieved: Yes  PT Discharge Precautions/Restrictions Precautions Precautions: Fall Precaution Comments: R  AKA Restrictions Weight Bearing Restrictions: Yes RLE Weight Bearing: Non weight bearing Other Position/Activity Restrictions: On 2L O2 Cognition Overall Cognitive Status: Within Functional Limits for tasks assessed Arousal/Alertness: Awake/alert Orientation Level: Disoriented to time;Oriented to person;Oriented to place;Oriented to situation Memory: Appears intact Awareness: Appears intact Problem Solving: Impaired Safety/Judgment: Appears intact Comments: pt with significant fear of falling Sensation Sensation Light Touch: Impaired by gross assessment Proprioception: Impaired by gross assessment Additional Comments: decreased along incision on Rt residual limb Coordination Gross Motor Movements are Fluid and Coordinated: No Fine Motor Movements are Fluid and Coordinated: Yes Coordination and Movement Description: grossly uncoordinated due to R AKA, decreased balance/postural control, generalized weakness, poor endurance, and fatigue Finger Nose Finger Test: [facility] but slow Heel Shin Test: decreased on L LE unable to perform on R LE due to AKA Motor  Motor Motor: Abnormal postural alignment and control Motor - Skilled Clinical Observations: grossly uncoordinated due to R AKA, decreased balance/postural control, generalized weakness, poor endurance, fatigue, and fear of falling  Mobility Bed Mobility Bed Mobility: Supine to Sit;Sit to Supine Rolling Right: Independent with assistive device Rolling Left: Independent with assistive device Supine to Sit: Independent with assistive device Sit to Supine: Independent with assistive device Transfers Transfers: Lateral/Scoot Transfers;Sit to Stand;Stand Pivot Transfers Sit to Stand: Contact Guard/Touching assist Stand Pivot Transfers: Contact Guard/Touching assist Lateral/Scoot Transfers: Supervision/Verbal cueing Transfer (Assistive device): Rolling walker Locomotion  Gait Ambulation: Yes Gait Assistance: 2 Helpers Gait Distance  (Feet): 10 Feet Assistive device: Rolling walker Gait Assistance Details: Verbal cues for technique;Verbal cues for safe use of DME/AE Gait Assistance Details: CGA + wc follow Stairs / Additional Locomotion Stairs: No Wheelchair Mobility Wheelchair Mobility: Yes Wheelchair Assistance: Chartered loss adjuster: Both upper extremities Wheelchair Parts Management: Supervision/cueing  Distance: 150  Trunk/Postural Assessment  Cervical Assessment Cervical Assessment: Exceptions to [facility] (forward head) Thoracic Assessment Thoracic Assessment: Exceptions to [facility] (kyphosis) Lumbar Assessment Lumbar Assessment: Exceptions to [facility] (posterior pelvic tilt) Postural Control Postural Control: Deficits on evaluation  Balance Balance Balance Assessed: Yes Static Sitting Balance Static Sitting - Balance Support: Bilateral upper extremity supported;Feet supported Static Sitting - Level of Assistance: 6: Modified independent (Device/Increase time) Dynamic Sitting Balance Dynamic Sitting - Balance Support: Bilateral upper extremity supported;Feet unsupported Dynamic Sitting - Level of Assistance: 6: Modified independent (Device/Increase time) Static Standing Balance Static Standing - Balance Support: Bilateral upper extremity supported (RW) Static Standing - Level of Assistance: 5: Stand by assistance (CGA) Dynamic Standing Balance Dynamic Standing - Balance Support: Bilateral upper extremity supported (RW) Dynamic Standing - Level of Assistance: 5: Stand by assistance (CGA) Extremity Assessment  RLE Assessment RLE Assessment: Exceptions to [facility] General Strength Comments: grossly generalized to 4-/5 (hip flexion, abduction, adduction); pt with increased difficulty with active movements     Anna M Johnson  Anna Johnson PT, DPT  08/29/2019, 3:23 PM  Karoline Caldwell, PT, DPT, CBIS

## 2019-08-30 ENCOUNTER — Inpatient Hospital Stay (HOSPITAL_COMMUNITY): Payer: Medicare Other | Admitting: Occupational Therapy

## 2019-08-30 ENCOUNTER — Inpatient Hospital Stay (HOSPITAL_COMMUNITY): Payer: Medicare Other | Admitting: Physical Therapy

## 2019-08-30 DIAGNOSIS — I739 Peripheral vascular disease, unspecified: Secondary | ICD-10-CM

## 2019-08-30 LAB — BASIC METABOLIC PANEL
Anion gap: 9 (ref 5–15)
BUN: 23 mg/dL (ref 8–23)
CO2: 32 mmol/L (ref 22–32)
Calcium: 9.5 mg/dL (ref 8.9–10.3)
Chloride: 92 mmol/L — ABNORMAL LOW (ref 98–111)
Creatinine, Ser: 0.9 mg/dL (ref 0.61–1.24)
GFR calc Af Amer: >60 mL/min (ref >60)
GFR calc non Af Amer: >60 mL/min (ref >60)
Glucose, Bld: 90 mg/dL (ref 70–99)
Potassium: 4.3 mmol/L (ref 3.5–5.1)
Sodium: 133 mmol/L — ABNORMAL LOW (ref 135–145)

## 2019-08-30 LAB — SARS CORONAVIRUS 2 (TAT 6-24 HRS): SARS Coronavirus 2: NEGATIVE

## 2019-08-30 NOTE — Progress Notes (Signed)
Physical Therapy Session Note  Patient Details  Name: [name] MRN: [id number] Date of Birth: 1929-10-28  Today's Date: 08/30/2019 PT Individual Time: 0731-0828 PT Individual Time Calculation (min): 57 min   Short Term Goals: Week 1:  PT Short Term Goal 1 (Week 1): pt will perform bed mobility with supervision PT Short Term Goal 1 - Progress (Week 1): Met PT Short Term Goal 2 (Week 1): Pt will transfer sit<>stand with LRAD min A PT Short Term Goal 2 - Progress (Week 1): Met PT Short Term Goal 3 (Week 1): Pt will perform simulated car transfer with LRAD mod A PT Short Term Goal 3 - Progress (Week 1): Met Week 2:  PT Short Term Goal 1 (Week 2): STG= LTG d/t LOS  Skilled Therapeutic Interventions/Progress Updates:    pt received in sitting EOB with breakfast tray on opposite side of bed, pt requested tray be brought to side he was sitting on and asked ig he could eat breakfast before therapy and agreeable to therapy. Pt ate breakfast at EOB, supervision for several minutes without noted LOB though intermittent L side trunk lean with fatigue and supervision for dynamic sitting balance without UE support. Pt reported at start of session he felt slightly short of breath and that "I always feel feeble in the mornings but that's normal but today I feel particularly awful." pt reported R shoulder pain 8/10, nursing made aware and came to bedside for medication. Pt continued to report he felt terrible and was having an increased work of breathing and beginning to have increased anxiety. Nursing present and pt felt better after speaking to her and receiving medications. Pt agreeable to bedside therapy at this time and directed in dynamic sitting balance for BUE active ROM for improved breathing pattern with paced breathing attempted at supervision no LOB noted, pt reported he felt slightly better but R shoulder continued to have pain. Pt then directed in LLE knee extension however after 8 reps pt reported  he could not continue with activity at this time. Pt encouraged to attempt transferring OOB to [facility] with squat pivot from bed or slide board if needed and pt denied politely reporting "I just don't have the strength". Multiple attempts made to encourage pt to transfer OOB however pt continued to deny and pt directed in sit>supine with HOB elevated at [facility] and PT assisted pt with elevating RLE with pillow for comfort. Pt left in bed, All needs in reach and in good condition. Call light in hand.    Therapy Documentation Precautions:  Precautions Precautions: Fall Precaution Comments: R AKA Restrictions Weight Bearing Restrictions: Yes RLE Weight Bearing: Non weight bearing Other Position/Activity Restrictions: On 2L O2    Therapy/Group: Individual Therapy  Junie Panning 08/30/2019, 8:34 AM

## 2019-08-30 NOTE — Progress Notes (Signed)
Occupational Therapy Session Note  Patient Details  Name: [name] MRN: [id number] Date of Birth: May 07, 1929  Today's Date: 08/30/2019 OT Individual Time: 1308-6578 OT Individual Time Calculation (min): 57 min   Skilled Therapeutic Interventions/Progress Updates:    Pt greeted in bed, reporting "it's a bad day," expressing that his Rt shoulder was hurting and that he was having trouble breathing, adamant that this doesn't reflect on the dynamap. His 02 sats were 100% via . Pt premedicated for shoulder pain. He reached EOB unassisted with the [facility] raised. He took his inhaler x2 with setup from spouse. Worked on ADL retraining via doffing soiled clothes and donning new ones, using lateral leans EOB. Pt required significantly increased time to initiate and assist during stated tasks due to fatigue. Mod A for doffing pants, Max A for donning them again and also donning shrinker sock that had rolled off. Setup and significant time for doffing/donning UB garments. Recommended for pt to stand while spouse applied barrier cream to buttocks but pt opting to return to bed and have the cream applied, also wanted to return to bed vs transfer to chair today. Left pt in the bed with all needs within reach and spouse present.   Therapy Documentation Precautions:  Precautions Precautions: Fall Precaution Comments: R AKA Restrictions Weight Bearing Restrictions: Yes RLE Weight Bearing: Non weight bearing Other Position/Activity Restrictions: On 2L O2 Vital Signs: Therapy Vitals Pulse Rate: 88 Resp: 18 Patient Position (if appropriate): Sitting Oxygen Therapy SpO2: 99 % O2 Device: Nasal Cannula O2 Flow Rate (L/min): 2.5 L/min ADL:        Therapy/Group: Individual Therapy  Mercy Malena A Yamileth Hayse 08/30/2019, 12:28 PM

## 2019-08-30 NOTE — Progress Notes (Signed)
During occupational therapy the patient complained of chest pain, more "muscular feeling than pressure". Patient described it as discomfort, but not unbearable. Therapist approached nurse and nurse entered the room to assess the patient, upon returning the patient stated that they felt much better and the chest pain had settled down. Vitals WNL. Nurse offered to call physician, but family and patient declined. Nurse told family and patient to please call if chest pain increased or any other symptoms were present.  [place]

## 2019-08-30 NOTE — Progress Notes (Signed)
RT in to see pt per RN/family request. Upon arrival to room pt in no distress, SpO2 on 2.5L 99% and clear/diminished breath sounds. Wife brought pt flutter in from home, this RT encouraged pt to keep working on that. He complains of a mild cough and is coughing up "green stuff". Pt able to perform flutter x6 before saying he is dizzy. Encouraged pt to keep performing flutter and to do so slowly. Pt and wife verbalize understanding. Pt and wife asked if he could wear his Trilogy intermittent if needed for SOB and instructed then that yes he can. Both pt and wife verbalize understanding. RT will continue to monitor.

## 2019-08-30 NOTE — Progress Notes (Signed)
Occupational Therapy Session Note  Patient Details  Name: [name] MRN: [id number] Date of Birth: [birth year]  Today's Date: 08/30/2019 OT Individual Time: 1420-1448 OT Individual Time Calculation (min): 28 min    Skilled Therapeutic Interventions/Progress Updates:    Pt greeted in bed, reporting feeling a bit better, motivated to practice lateral scoot transfers to the w/c. Bed mobility completed unassisted and then OT donned his Lt [place]co shoe. CGA for transferring to the w/c with vcs. Pt stated post transfer "that took 100% of my energy." OT instructed him through diaphragmatic breathing exercises, focusing on lateral chest expansion and mindful activation of diaphragm. Pt still reported feeling SOB, 02 sats via [place] 98% consistently when assessed during tx. He also reported some chest tightness, when asked if it was painful he stated "I don't know." Relayed this to RN. Pt transferred back to the bed with Min A, and then back to the w/c again with CGA given vcs. Pt remained sitting up in the w/c with all needs within reach and safety belt fastened. OT provided him with lavender aromatherapy to increase ease of breathing/PNS activity.   Therapy Documentation Precautions:  Precautions Precautions: Fall Precaution Comments: R AKA Restrictions Weight Bearing Restrictions: Yes RLE Weight Bearing: Non weight bearing Other Position/Activity Restrictions: On 2L O2 Vital Signs: Therapy Vitals Temp: 99.9 F (37.7 C) Temp Source: Oral Pulse Rate: 92 Resp: 20 BP: 110/60 Patient Position (if appropriate): Lying Oxygen Therapy SpO2: 100 % O2 Device: Nasal Cannula O2 Flow Rate (L/min): 2.5 L/min Pain: no c/o pain during session   ADL:     Therapy/Group: Individual Therapy  Cherilyn Sautter A Georgian Mcclory 08/30/2019, 4:13 PM

## 2019-08-30 NOTE — Progress Notes (Signed)
Pt able to place self on home Trilogy. RT will continue to monitor.

## 2019-08-30 NOTE — Progress Notes (Signed)
[place] PHYSICAL MEDICINE & REHABILITATION PROGRESS NOTE  Subjective/Complaints: Pt states he slept better last night. Wearing cpap. When I spoke to him yesterday he was c/o difficulty swallowing liquids. Also felt groggy and "disassociated"  ROS: Patient denies fever, rash, sore throat, blurred vision, nausea, vomiting, diarrhea, cough, shortness of breath or chest pain,   headache, or mood change.   Objective: Vital Signs: Blood pressure 114/65, pulse 81, temperature 97.7 F (36.5 C), temperature source Oral, resp. rate 18, height 5\' 9"  (1.753 m), weight 55.4 kg, SpO2 98 %. No results found. Recent Labs    08/29/19 0843  WBC 11.4*  HGB 9.5*  HCT 29.9*  PLT 381   Recent Labs    08/29/19 0843 08/30/19 0504  NA 130* 133*  K 3.8 4.3  CL 89* 92*  CO2 31 32  GLUCOSE 152* 90  BUN 22 23  CREATININE 0.95 0.90  CALCIUM 9.4 9.5    Physical Exam: BP 114/65 (BP Location: Right Arm)   Pulse 81   Temp 97.7 F (36.5 C) (Oral)   Resp 18   Ht 5\' 9"  (1.753 m)   Wt 55.4 kg   SpO2 98%   BMI 18.04 kg/m  Constitutional: No distress . Vital signs reviewed. HEENT: EOMI, oral membranes moist Neck: supple Cardiovascular: RRR without murmur. No JVD    Respiratory/Chest: CTA Bilaterally without wheezes or rales. Normal effort, CPAP GI/Abdomen: BS +, non-tender, non-distended Ext: no clubbing, cyanosis, or edema Psych: pleasant and cooperative Skin: Right AKA with staples CDI Coccyx stage II ulcer not examined today Left heel stage II with mild serosanguineous drainage--dressed Psych: Normal mood.  Normal behavior. Musc: Right AKA with improving edema and tenderness Left lower extremity edema improving Neuro: easily arousable Extreme HOH Motor: Bilateral upper extremities: 4/5 proximal distally, stable  Left lower extremity: 4-/5 proximal distal, stable Right lower extremity: Hip flexion, knee extension 3+/5 (some pain patient), improving  Assessment/Plan: 1. Functional  deficits secondary to right AKA which require 3+ hours per day of interdisciplinary therapy in a comprehensive inpatient rehab setting.  Physiatrist is providing close team supervision and 24 hour management of active medical problems listed below.  Physiatrist and rehab team continue to assess barriers to discharge/monitor patient progress toward functional and medical goals  Care Tool:  Bathing    Body parts bathed by patient: Right arm, Left arm, Chest, Abdomen, Front perineal area, Right upper leg, Left upper leg, Face   Body parts bathed by helper: Left lower leg, Buttocks Body parts n/a: Right lower leg   Bathing assist Assist Level: Minimal Assistance - Patient > 75%     Upper Body Dressing/Undressing Upper body dressing   What is the patient wearing?: Pull over shirt    Upper body assist Assist Level: Set up assist    Lower Body Dressing/Undressing Lower body dressing      What is the patient wearing?: Underwear/pull up, Pants     Lower body assist Assist for lower body dressing: Moderate Assistance - Patient 50 - 74%     Toileting Toileting    Toileting assist Assist for toileting: Maximal Assistance - Patient 25 - 49%     Transfers Chair/bed transfer  Transfers assist     Chair/bed transfer assist level: Minimal Assistance - Patient > 75% Chair/bed transfer assistive device: Programmer, multimedia   Ambulation assist      Assist level: Minimal Assistance - Patient > 75% Assistive device: Parallel bars Max distance: 8   Walk  10 feet activity   Assist           Walk 50 feet activity   Assist           Walk 150 feet activity   Assist           Walk 10 feet on uneven surface  activity   Assist           Wheelchair     Assist Will patient use wheelchair at discharge?: Yes Type of Wheelchair: Manual    Wheelchair assist level: Supervision/Verbal cueing Max wheelchair distance: 120 (rest breaks, max  48ft at time)    Wheelchair 50 feet with 2 turns activity    Assist            Wheelchair 150 feet activity     Assist            Medical Problem List and Plan: 1.  Impaired mobility and ADLs secondary to right AKA with significant medical comorbidities  Continue CIR, 15/7  Discussed with patient's wife discharge disposition.  Discussed discharge date, progress in therapies, discharge disposition-wife prefers patient to stay longer, however attempted to explain to wife rehab course.  Patient will need to go to SNF at discharge.    -I spoke with pt and wife regarding SNF, rehab progress, prognosis, etc yesterday. They were both accepting. SNF bed Monday  2.  Antithrombotics: -DVT/anticoagulation:  Pharmaceutical: Heparin             -antiplatelet therapy: Aspirin 81mg  daily 3. Pain Management: Has residual limb pain--educate on desensitization techniques.   Norco will be discontinued. Continue tramadol   Lidoderm patch added for left heel on 8/9  Has headaches- could be secondary to caffeine withdrawal: edited diet order to include coffee with caffeine.    Apply warm compress 15 minutes three times per day (ordered)  Lidocaine jelly BID around coccyx sore  Voltaren gel to right hip ordered with improvement  Controlled with meds on 8/21  Monitor with increased exertion. 4. Mood: Appears depressed--team to provide ego support. LCSW to follow for evaluation and support.   Will hold off on antidepressant given sodium levels. Discussed with wife  Wife may spend the night to assist with patient compliance and anxiety.  Xanax to .25mg  TID for anxiety, educated patient, decreased to twice daily on 8/20  8/21 xanax reduction seems to have helped             -antipsychotic agents: N/A 5. Neuropsych: This patient may be capable of making decisions on his own behalf. Intermittently disoriented.  6. Skin/Wound Care:  Monitor wound for healing.    Stump shrinker for edema control    Coccyx injury: reposition frequently 7. Fluids/Electrolytes/Nutrition: Monitor I/Os 8.COPD/chronic hypoxic respiratory failure:   Continues to require supplemental oxygen (baseline)  Chest x-ray personally reviewed, mild effusion  ECG personally reviewed, showing premature complexes  Appreciate Pulm recs  Has been educated by pharmacy  Need to continue to stress compliance 9. BPH/renal calculi:   Continue foley  Continue finasteride.  10. Anxiety d/o: continue Xanax tid prn. 11. Hyponatremia:   Sodium 130 on 8/20, up to 133 8/21  Fluid restriction to 1569mL.   Tolvaptan on hold per nephro.   Appreciate Nephro recs. They have signed off  See #15  -recheck BMET Monday  12. ABLA:   Hemoglobin 9.5 on 8/20 13. Nasal congestion: Afrin nasal spray X 3 days.  Added saline spray as well as humidification.   14.  HTN: Has history of orthostatic hypotension.  Monitor BP   Continue aldactone 12.5/25 mgQOD and Toprol XL.   Lasix   Labile on 8/20, monitor for trend  Monitor with increased exertion 15. CAD/Chronic systolic CHF: Monitor for symptoms with increase in activity. On aldactone, toprol XL, ASA  and Lipitor Filed Weights   08/26/19 0500 08/27/19 0500 08/28/19 0534  Weight: 57.2 kg 55.4 kg 55.4 kg   Lasix restarted per Nephro, increased to BID on 8/19  Stable on 8/19, no weights since 16. Transaminitis: Resolved  LFTs WNL on 8/13  Continue to monitor 17.  Severe hypoalbuminemia  Supplement initiated. Pt states he just doesn't have much appetite  -reviewed importance of intake to his recovery  -?mild dysphagia: seems to be only for larger pills per nurse. Will hold on any further work up at this point.  18.  Chronic sinusitis according to patient's wife has been seen at [facility] who recommended a 26-month course of azithromycin.    Azithromycin at 250 mg/day. Has Afrin spray as well.  19.  Hypothyroidism  Synthroid 20.  Leukocytosis  WBCs 11.4 on 8/20, labs ordered for  Monday  Afebrile  LOS: 9 days A FACE TO FACE EVALUATION WAS PERFORMED  Meredith Staggers 08/30/2019, 10:33 AM

## 2019-08-31 ENCOUNTER — Inpatient Hospital Stay (HOSPITAL_COMMUNITY): Payer: Medicare Other | Admitting: Occupational Therapy

## 2019-08-31 ENCOUNTER — Inpatient Hospital Stay (HOSPITAL_COMMUNITY): Payer: Medicare Other | Admitting: Physical Therapy

## 2019-08-31 NOTE — Progress Notes (Signed)
Patient's wife approached nurse and said "we are going to take him ourselves to the nursing home tomorrow". Nurse stated she didn't think that would be doable, but she would leave a progress note for the doctor and social worker to address tomorrow. Nurse voiced that this was more than likely not possible, wife said to still please ask. Nurse is leaving progress note and also leaving note in social worker's box for them to address in the morning.

## 2019-08-31 NOTE — Progress Notes (Signed)
[place] PHYSICAL MEDICINE & REHABILITATION PROGRESS NOTE  Subjective/Complaints: Slept ok. Still reports feeling sleepy during the day with varying levels of energy  ROS: Patient denies fever, rash, sore throat, blurred vision, nausea, vomiting, diarrhea, cough,  hest pain, headache, or mood change. .   Objective: Vital Signs: Blood pressure 125/67, pulse 83, temperature 98.9 F (37.2 C), resp. rate 20, height 5\' 9"  (1.753 m), weight 56 kg, SpO2 99 %. No results found. Recent Labs    08/29/19 0843  WBC 11.4*  HGB 9.5*  HCT 29.9*  PLT 381   Recent Labs    08/29/19 0843 08/30/19 0504  NA 130* 133*  K 3.8 4.3  CL 89* 92*  CO2 31 32  GLUCOSE 152* 90  BUN 22 23  CREATININE 0.95 0.90  CALCIUM 9.4 9.5    Physical Exam: BP 125/67 (BP Location: Left Arm)   Pulse 83   Temp 98.9 F (37.2 C)   Resp 20   Ht 5\' 9"  (1.753 m)   Wt 56 kg   SpO2 99%   BMI 18.23 kg/m  Constitutional: No distress . Vital signs reviewed. HEENT: EOMI, oral membranes moist Neck: supple Cardiovascular: RRR without murmur. No JVD    Respiratory/Chest: CTA Bilaterally without wheezes or rales. Normal effort    GI/Abdomen: BS +, non-tender, non-distended Ext: no clubbing, cyanosis, or edema Psych: pleasant and cooperative Skin: Right AKA with staples CDI Coccyx stage II ulcer not examined today Left heel stage II with mild serosanguineous drainage--dressed Psych: Normal mood.  Normal behavior. Musc: Right AKA with improving edema and tenderness Left lower extremity edema improving Neuro:arouses easily Extreme HOH Motor: Bilateral upper extremities: 4/5 proximal distally, stable  Left lower extremity: 4-/5 proximal distal, stable Right lower extremity: Hip flexion, knee extension 3+/5 (some pain patient), improving  Assessment/Plan: 1. Functional deficits secondary to right AKA which require 3+ hours per day of interdisciplinary therapy in a comprehensive inpatient rehab  setting.  Physiatrist is providing close team supervision and 24 hour management of active medical problems listed below.  Physiatrist and rehab team continue to assess barriers to discharge/monitor patient progress toward functional and medical goals  Care Tool:  Bathing    Body parts bathed by patient: Right arm, Left arm, Chest, Abdomen, Front perineal area, Right upper leg, Left upper leg, Face   Body parts bathed by helper: Left lower leg, Buttocks Body parts n/a: Right lower leg   Bathing assist Assist Level: Minimal Assistance - Patient > 75%     Upper Body Dressing/Undressing Upper body dressing   What is the patient wearing?: Pull over shirt    Upper body assist Assist Level: Set up assist    Lower Body Dressing/Undressing Lower body dressing      What is the patient wearing?: Underwear/pull up, Pants     Lower body assist Assist for lower body dressing: Moderate Assistance - Patient 50 - 74%     Toileting Toileting    Toileting assist Assist for toileting: Maximal Assistance - Patient 25 - 49%     Transfers Chair/bed transfer  Transfers assist     Chair/bed transfer assist level: Minimal Assistance - Patient > 75% Chair/bed transfer assistive device: Programmer, multimedia   Ambulation assist      Assist level: Minimal Assistance - Patient > 75% Assistive device: Parallel bars Max distance: 8   Walk 10 feet activity   Assist           Walk 50 feet  activity   Assist           Walk 150 feet activity   Assist           Walk 10 feet on uneven surface  activity   Assist           Wheelchair     Assist Will patient use wheelchair at discharge?: Yes Type of Wheelchair: Manual    Wheelchair assist level: Supervision/Verbal cueing Max wheelchair distance: 120 (rest breaks, max 44ft at time)    Wheelchair 50 feet with 2 turns activity    Assist            Wheelchair 150 feet activity      Assist            Medical Problem List and Plan: 1.  Impaired mobility and ADLs secondary to right AKA with significant medical comorbidities  Continue CIR, 15/7  -have reviewed etiologies of fatigue with patient  -I have also spoken with pt and wife regarding SNF, rehab progress, prognosis, etc. They were both accepting. SNF bed available Monday  2.  Antithrombotics: -DVT/anticoagulation:  Pharmaceutical: Heparin             -antiplatelet therapy: Aspirin 81mg  daily 3. Pain Management: Has residual limb pain--educate on desensitization techniques.   Norco will be discontinued. Continue tramadol   Lidoderm patch added for left heel on 8/9  Has headaches- could be secondary to caffeine withdrawal: edited diet order to include coffee with caffeine.    Apply warm compress 15 minutes three times per day (ordered)  Lidocaine jelly BID around coccyx sore  Voltaren gel to right hip ordered with improvement  Controlled with meds on 8/22  Monitor with increased exertion. 4. Mood: Appears depressed--team to provide ego support. LCSW to follow for evaluation and support.   Will hold off on antidepressant given sodium levels. Discussed with wife  Wife may spend the night to assist with patient compliance and anxiety.  Xanax to .25mg  TID for anxiety, educated patient, decreased to twice daily on 8/20  8/21-22 xanax reduction seems to have helped somewhat             -antipsychotic agents: N/A 5. Neuropsych: This patient may be capable of making decisions on his own behalf. Intermittently disoriented.  6. Skin/Wound Care:  Monitor wound for healing.    Stump shrinker for edema control   Coccyx injury: reposition frequently 7. Fluids/Electrolytes/Nutrition: Monitor I/Os 8.COPD/chronic hypoxic respiratory failure:   Continues to require supplemental oxygen (baseline)  Chest x-ray personally reviewed, mild effusion  ECG personally reviewed, showing premature complexes  Appreciate Pulm  recs  Has been educated by pharmacy  Need to continue to stress compliance 9. BPH/renal calculi:   Continue foley  Continue finasteride.  10. Anxiety d/o: continue Xanax tid prn. 11. Hyponatremia:   Sodium 130 on 8/20, up to 133 8/21  Fluid restriction to 1513mL.   Tolvaptan on hold per nephro.   Appreciate Nephro recs. They have signed off  See #15  -recheck BMET Monday  12. ABLA:   Hemoglobin 9.5 on 8/20 13. Nasal congestion: Afrin nasal spray X 3 days.  Added saline spray as well as humidification.   14. HTN: Has history of orthostatic hypotension.  Monitor BP   Continue aldactone 12.5/25 mgQOD and Toprol XL.   Lasix   8/22 bp controlled 15. CAD/Chronic systolic CHF: Monitor for symptoms with increase in activity. On aldactone, toprol XL, ASA  and Lipitor [employer]  08/27/19 0500 08/28/19 0534 08/31/19 0500  Weight: 55.4 kg 55.4 kg 56 kg   Lasix restarted per Nephro, increased to BID on 8/19  8/22 weights stable from 55-56kg 16. Transaminitis: Resolved  LFTs WNL on 8/13  Continue to monitor 17.  Severe hypoalbuminemia  Supplement initiated. Pt states he just doesn't have much appetite  -reviewed importance of intake to his recovery  -?mild dysphagia: seems to be only for larger pills per nurse. Nurse educating patient/wife on ways to help swallowing these pills 18.  Chronic sinusitis according to patient's wife has been seen at [facility] who recommended a 2-month course of azithromycin.    Azithromycin at 250 mg/day. Has Afrin spray as well.  19.  Hypothyroidism  Synthroid 20.  Leukocytosis  WBCs 11.4 on 8/20, labs ordered for Monday  Afebrile  LOS: 10 days A FACE TO [place] 08/31/2019, 10:10 AM

## 2019-08-31 NOTE — Progress Notes (Signed)
Occupational Therapy Session Note  Patient Details  Name: [name] MRN: [id number] Date of Birth: [birth year]  Today's Date: 08/31/2019 OT Individual Time: 0858-1010 OT Individual Time Calculation (min): 72 min    Short Term Goals: Week 2:  OT Short Term Goal 1 (Week 2): Pt will complete toilet hygiene and clothing management sit to stand with mod assist. OT Short Term Goal 2 (Week 2): Pt will complete LB dressing (with exception of footwear) with min assist at sit > stand level OT Short Term Goal 3 (Week 2): Pt will complete toilet transfers with min assist with <2 cues for sequencing/motor planning  Skilled Therapeutic Interventions/Progress Updates:    Pt greeted at time of session supine in bed resting with wife present, agreeable to OT session and no c/o pain. Pt performed self inhaler with wife to assist x2. Discussed POC, DC planning, and plans for SNF placement until further discussion/planning can be arranged to determine patient's level of progress with ADLs. Supine to sit Supervision with bed features, performed ADL session for UB/LB bathing and dressing with pt able to doff clothing with extended time, Min A. UB bathing with supervision for thoroughness but pt able to perform, UB dress with set up. Did not want to perform LB bathing but simulated the task seated EOB with lateral leans to demonstrate ROM and reaching capacity, would require some assist for washing buttocks, but able to bring LLE up to himself to assist. LB dressing with assist to thread LLE and foley through pants/underwear but pt able to pull up and don over hips in sitting with frequent lateral leans, fatiguing quickly and requiring frequent rest breaks. Eventually sit to supine Supervision and donned pants over hips with bridging with supervision. Declined all form of standing today d/t fatigue. Donned sock/shoe total assist and shrinker in the same manner. Partial stand pivot bed <> drop arm BSC with CGA, educated on  techniques to perform clothing management with lateral leans as pt did not need to use the bathroom today. Pt ed on elevation techniques for residual limb to prevent swelling. O2 at 99-100% throughout session on O2 continuous via [place]. [name] had questions about DC planning and SNF placement, answered to the best of ability with recommendation to speak with MD in the am. Positioned for comfort, call bell in reach.   Therapy Documentation Precautions:  Precautions Precautions: Fall Precaution Comments: R AKA Restrictions Weight Bearing Restrictions: Yes RLE Weight Bearing: Non weight bearing Other Position/Activity Restrictions: On 2L O2     Therapy/Group: Individual Therapy  Viona Gilmore 08/31/2019, 12:41 PM

## 2019-08-31 NOTE — Progress Notes (Signed)
Physical Therapy Session Note  Patient Details  Name: [name] MRN: [id number] Date of Birth: 1929-03-10  Today's Date: 08/31/2019 PT Individual Time: 8864-8472 PT Individual Time Calculation (min): 41 min   Short Term Goals: Week 1:  PT Short Term Goal 1 (Week 1): pt will perform bed mobility with supervision PT Short Term Goal 1 - Progress (Week 1): Met PT Short Term Goal 2 (Week 1): Pt will transfer sit<>stand with LRAD min A PT Short Term Goal 2 - Progress (Week 1): Met PT Short Term Goal 3 (Week 1): Pt will perform simulated car transfer with LRAD mod A PT Short Term Goal 3 - Progress (Week 1): Met  Skilled Therapeutic Interventions/Progress Updates:  Pt was seen bedside in the pm, finishing up toileting with nursing. Pt stating I am having a rough day today but willing to participate with therapy once recovered. Pt willing to work on transfers at edge of bed. Pt performed bed to w/c squat pivot transfers with min A and verbal cues. Pt required rest breaks between each transfers. Pt also performed sit to stand transfers at edge of bed with rolling walker and min A. Pt stood x 2 for about 15 seconds each stand. Pt left sitting up in w/c with wife at bedside and call bell within reach.   Therapy Documentation Precautions:  Precautions Precautions: Fall Precaution Comments: R AKA Restrictions Weight Bearing Restrictions: Yes RLE Weight Bearing: Non weight bearing Other Position/Activity Restrictions: On 2L O2 General:   Vital Signs: Oxygen Therapy SpO2: 100 % O2 Device: Nasal Cannula O2 Flow Rate (L/min): 2 L/min Pain: No c/o pain.    Therapy/Group: Individual Therapy  Dub Amis 08/31/2019, 3:20 PM

## 2019-09-01 ENCOUNTER — Other Ambulatory Visit: Payer: Self-pay | Admitting: *Deleted

## 2019-09-01 DIAGNOSIS — M79672 Pain in left foot: Secondary | ICD-10-CM

## 2019-09-01 DIAGNOSIS — R3914 Feeling of incomplete bladder emptying: Secondary | ICD-10-CM | POA: Diagnosis not present

## 2019-09-01 DIAGNOSIS — I739 Peripheral vascular disease, unspecified: Secondary | ICD-10-CM | POA: Diagnosis not present

## 2019-09-01 DIAGNOSIS — R338 Other retention of urine: Secondary | ICD-10-CM | POA: Diagnosis not present

## 2019-09-01 DIAGNOSIS — Z9181 History of falling: Secondary | ICD-10-CM | POA: Diagnosis not present

## 2019-09-01 DIAGNOSIS — S78119A Complete traumatic amputation at level between unspecified hip and knee, initial encounter: Secondary | ICD-10-CM | POA: Diagnosis not present

## 2019-09-01 DIAGNOSIS — R498 Other voice and resonance disorders: Secondary | ICD-10-CM | POA: Diagnosis not present

## 2019-09-01 DIAGNOSIS — J449 Chronic obstructive pulmonary disease, unspecified: Secondary | ICD-10-CM | POA: Diagnosis not present

## 2019-09-01 DIAGNOSIS — Z89611 Acquired absence of right leg above knee: Secondary | ICD-10-CM | POA: Diagnosis not present

## 2019-09-01 DIAGNOSIS — Z4789 Encounter for other orthopedic aftercare: Secondary | ICD-10-CM | POA: Diagnosis not present

## 2019-09-01 DIAGNOSIS — D649 Anemia, unspecified: Secondary | ICD-10-CM | POA: Diagnosis not present

## 2019-09-01 DIAGNOSIS — M6281 Muscle weakness (generalized): Secondary | ICD-10-CM | POA: Diagnosis not present

## 2019-09-01 DIAGNOSIS — N401 Enlarged prostate with lower urinary tract symptoms: Secondary | ICD-10-CM | POA: Diagnosis not present

## 2019-09-01 DIAGNOSIS — R1312 Dysphagia, oropharyngeal phase: Secondary | ICD-10-CM | POA: Diagnosis not present

## 2019-09-01 DIAGNOSIS — Z89619 Acquired absence of unspecified leg above knee: Secondary | ICD-10-CM | POA: Diagnosis not present

## 2019-09-01 DIAGNOSIS — E871 Hypo-osmolality and hyponatremia: Secondary | ICD-10-CM | POA: Diagnosis not present

## 2019-09-01 DIAGNOSIS — R262 Difficulty in walking, not elsewhere classified: Secondary | ICD-10-CM | POA: Diagnosis not present

## 2019-09-01 LAB — CBC
HCT: 27.9 % — ABNORMAL LOW (ref 39.0–52.0)
Hemoglobin: 8.8 g/dL — ABNORMAL LOW (ref 13.0–17.0)
MCH: 31.5 pg (ref 26.0–34.0)
MCHC: 31.5 g/dL (ref 30.0–36.0)
MCV: 100 fL (ref 80.0–100.0)
Platelets: 303 10*3/uL (ref 150–400)
RBC: 2.79 MIL/uL — ABNORMAL LOW (ref 4.22–5.81)
RDW: 13.3 % (ref 11.5–15.5)
WBC: 7.8 10*3/uL (ref 4.0–10.5)
nRBC: 0 % (ref 0.0–0.2)

## 2019-09-01 LAB — BASIC METABOLIC PANEL
Anion gap: 9 (ref 5–15)
BUN: 30 mg/dL — ABNORMAL HIGH (ref 8–23)
CO2: 34 mmol/L — ABNORMAL HIGH (ref 22–32)
Calcium: 10 mg/dL (ref 8.9–10.3)
Chloride: 89 mmol/L — ABNORMAL LOW (ref 98–111)
Creatinine, Ser: 1.08 mg/dL (ref 0.61–1.24)
GFR calc Af Amer: >60 mL/min (ref >60)
GFR calc non Af Amer: >60 mL/min (ref >60)
Glucose, Bld: 90 mg/dL (ref 70–99)
Potassium: 4.3 mmol/L (ref 3.5–5.1)
Sodium: 132 mmol/L — ABNORMAL LOW (ref 135–145)

## 2019-09-01 MED ORDER — CALCIUM CARBONATE ANTACID 500 MG PO CHEW: 1.0000 | CHEWABLE_TABLET | Freq: Two times a day (BID) | ORAL | Status: AC | PRN

## 2019-09-01 MED ORDER — DICLOFENAC SODIUM 1 % EX GEL: 2.0000 g | Freq: Four times a day (QID) | CUTANEOUS | Status: DC | PRN

## 2019-09-01 MED ORDER — ALPRAZOLAM 0.25 MG PO TABS: ORAL_TABLET | ORAL | 3 refills | Status: DC

## 2019-09-01 MED ORDER — LIDOCAINE HCL URETHRAL/MUCOSAL 2 % EX GEL: 1.0000 "application " | Freq: Two times a day (BID) | CUTANEOUS | Status: DC

## 2019-09-01 MED ORDER — PANTOPRAZOLE SODIUM 20 MG PO TBEC: 20.0000 mg | DELAYED_RELEASE_TABLET | Freq: Every day | ORAL | Status: DC

## 2019-09-01 MED ORDER — FUROSEMIDE 20 MG PO TABS: 20.0000 mg | ORAL_TABLET | Freq: Two times a day (BID) | ORAL | Status: DC

## 2019-09-01 MED ORDER — ENSURE ENLIVE PO LIQD: 237.0000 mL | Freq: Three times a day (TID) | ORAL | 12 refills | Status: DC

## 2019-09-01 MED ORDER — ZINC OXIDE 12.8 % EX OINT: TOPICAL_OINTMENT | CUTANEOUS | 0 refills | Status: DC | PRN

## 2019-09-01 MED ORDER — POLYETHYLENE GLYCOL 3350 17 G PO PACK: 17.0000 g | PACK | Freq: Every day | ORAL | 0 refills | Status: AC

## 2019-09-01 MED ORDER — MELATONIN 3 MG PO TABS: 3.0000 mg | ORAL_TABLET | Freq: Every day | ORAL | 0 refills | Status: DC

## 2019-09-01 MED ORDER — LEVOTHYROXINE SODIUM 25 MCG PO TABS: 25.0000 ug | ORAL_TABLET | Freq: Every day | ORAL | Status: DC

## 2019-09-01 MED ORDER — HYDROCERIN EX CREA: 1.0000 "application " | TOPICAL_CREAM | Freq: Two times a day (BID) | CUTANEOUS | 0 refills | Status: DC

## 2019-09-01 MED ORDER — SYSTANE 0.4-0.3 % OP SOLN: 1.0000 [drp] | Freq: Two times a day (BID) | OPHTHALMIC | 0 refills | Status: AC | PRN

## 2019-09-01 MED ORDER — ALPRAZOLAM 0.25 MG PO TABS: ORAL_TABLET | ORAL | 0 refills | Status: DC

## 2019-09-01 MED ORDER — POTASSIUM CHLORIDE CRYS ER 20 MEQ PO TBCR: 40.0000 meq | EXTENDED_RELEASE_TABLET | Freq: Every day | ORAL | Status: DC

## 2019-09-01 MED ORDER — LIDOCAINE 5 % EX PTCH: MEDICATED_PATCH | CUTANEOUS | 0 refills | Status: DC

## 2019-09-01 NOTE — Discharge Instructions (Signed)
Inpatient Rehab Discharge Instructions  [name] Discharge date and time:  09/01/19  Activities/Precautions/ Functional Status: Activity: activity as tolerated Diet: cardiac diet 1500 cc fluid/day Wound Care:  Wash with soap and water, pat dry and apply shrinker daily.  Functional status:  ___ No restrictions     ___ Walk up steps independently _X__ 24/7 supervision/assistance   ___ Walk up steps with assistance ___ Intermittent supervision/assistance  ___ Bathe/dress independently ___ Walk with walker     ___ Bathe/dress with assistance ___ Walk Independently    ___ Shower independently ___ Walk with assistance    _X__ Shower with assistance _X__ No alcohol     ___ Return to work/school ________  Special Instructions: 1. Wear prevalon boot on left foot when in bed.    My questions have been answered and I understand these instructions. I will adhere to these goals and the provided educational materials after my discharge from the hospital.  Patient/Caregiver Signature _______________________________ Date __________  Clinician Signature _______________________________________ Date __________  Please bring this form and your medication list with you to all your follow-up doctor's appointments.

## 2019-09-01 NOTE — Patient Outreach (Signed)
Member screened for potential [facility] Care Management needs as a benefit of [place] [employer].  Made aware by [facility] Liaison that [name] transitioned to [employer]. [name] was active with [place] Management prior to admission. He was also active with [facility] palliative prior.   Update received from [place].  Communication sent to Ruth to collaborate about anticipated dc plans and potential [facility] Care Management needs.  Will continue to follow while member resides in SNF.   Will keep [place] Management RNCM updated as appropriate.   Marthenia Rolling, MSN-Ed, RN,BSN [place] Acute Care Coordinator [phone] [facility]) [phone]  (Toll free office)

## 2019-09-01 NOTE — Progress Notes (Signed)
Inpatient Rehabilitation Care Coordinator  Discharge Note  The overall goal for the admission was met for:   Discharge location: [facility]  Length of Stay: 12 days with discharge 09/01/19  Discharge activity level:  Pt performed bed to w/c squat pivot transfers with min A and verbal cues. Pt required rest breaks between each transfers. Pt also performed sit to stand transfers at edge of bed with rolling walker and min A. Pt stood x 2 for about 15 seconds each stand.  Pt continues to require assistance with toileting tasks with clothing management d/t fatigue and generalized weakness. Fatigue and decreased endurance limiting.   Home/community participation: Acupuncturist provided included: MD, RD, PT, OT, RN, CM, TR, Neuropsych and SW  Financial Services: Medicare and Private Insurance: [employer]  Follow-up services arranged: Other: SNF and Patient/Family request agency HH: N/A, DME: N/A  Comments (or additional information): [place], [place]  Patient/Family verbalized understanding of follow-up arrangements: Yes  Individual responsible for coordination of the follow-up plan: Wife [phone]  Wife and daughter declined transportation via [place]; ride cancelled. Patient's wife and daughter confirmed family transportation to the facility discussed with the weekend supervisor at [facility] and wife brought in home oxygen tank for transport to the facility.    Margarito Liner

## 2019-09-01 NOTE — Discharge Summary (Addendum)
Physician Discharge Summary  Patient ID: [name] MRN: [id number] DOB/AGE: Dec 31, 1929 86 y.o.  Admit date: 08/21/2019 Discharge date: 09/01/2019  Discharge Diagnoses:  Principal Problem:   S/P AKA (above knee amputation) ([place]) Active Problems:   Post-operative pain   Chronic systolic congestive heart failure (HCC)   Hypothyroidism   Right hip pain   Pressure injury of skin of coccygeal region   Labile blood pressure   Anxiety state   Pain of left heel   Discharged Condition: stable   Significant Diagnostic Studies: DG Chest 2 View  Result Date: 08/18/2019 CLINICAL DATA:  Shortness of breath. EXAM: CHEST - 2 VIEW COMPARISON:  PA and lateral chest 06/27/2019. FINDINGS: The patient has new small bilateral pleural effusions. The lungs are markedly emphysematous but clear. Heart size is normal. Aortic atherosclerosis is noted. The patient is status post CABG. No acute or focal bony abnormality. IMPRESSION: Small bilateral pleural effusions.  The lungs are clear. Aortic Atherosclerosis (ICD10-I70.0) and Emphysema (ICD10-J43.9). Electronically Signed   By: Inge Rise M.D.   On: 08/18/2019 11:58   DG Abd 1 View  Result Date: 08/19/2019 CLINICAL DATA:  Abdominal pain EXAM: ABDOMEN - 1 VIEW COMPARISON:  12/03/2017 FINDINGS: Nonobstructive bowel gas pattern. Moderate-sized rectal stool bal. 8 mm calcification projects over the right renal shadow. No other significant radiographic abnormality are seen. Aortoiliac atherosclerosis. IMPRESSION: 1. Nonobstructive bowel gas pattern. 2. Moderate-sized rectal stool burden. 3. Right-sided nephrolithiasis. Electronically Signed   By: Davina Poke D.O.   On: 08/19/2019 12:40    Labs:  Basic Metabolic Panel: Recent Labs  Lab 08/25/19 1352 08/26/19 0643 08/27/19 0625 08/29/19 0843 08/30/19 0504 09/01/19 0509  NA 130* 132* 133* 130* 133* 132*  K  --   --   --  3.8 4.3 4.3  CL  --   --   --  89* 92* 89*  CO2  --   --   --  31 32 34*   GLUCOSE  --   --   --  152* 90 90  BUN  --   --   --  22 23 30*  CREATININE  --   --   --  0.95 0.90 1.08  CALCIUM  --   --   --  9.4 9.5 10.0    CBC: CBC Latest Ref Rng & Units 09/01/2019 08/29/2019 08/25/2019  WBC 4.0 - 10.5 K/uL 7.8 11.4(H) 8.8  Hemoglobin 13.0 - 17.0 g/dL 8.8(L) 9.5(L) 8.5(L)  Hematocrit 39 - 52 % 27.9(L) 29.9(L) 26.5(L)  Platelets 150 - 400 K/uL 303 381 398    CBG: No results for input(s): GLUCAP in the last 168 hours.  Brief HPI:   [name] is a 86 y.o. male with history of COPD with chronic hypoxic respiratory failure and oxygen dependent, BPH--Foley in place for retention, PAF, CAD, PVD with chronic nonhealing wound on right foot and no option for revascularization.  He was admitted on 08/11/2019 for right AKA by [name].  Postop was placed on 1500 cc fluid restriction for chronic diastolic heart failure.  Therapy evaluations done revealing orthostatic symptoms with sitting at edge of bed as well as weakness and pain affecting overall functional status.  CIR was recommended for follow-up therapy   Hospital Course: [name] was admitted to rehab 08/21/2019 for inpatient therapies to consist of PT and OT at least three hours five days a week. Past admission physiatrist, therapy team and rehab RN have worked together to provide customized  collaborative inpatient rehab.  Hospital course was significant for issues with phantom pain as well as left heel pain.  Pressure-relief measures in addition to [facility] and lidocaine gel added to help with pain management.  Due to reports of chronic sinusitis, home regimen of Zithromax was resumed.  He did report difficulty breathing with shortness of breath with activity that for pulmonary was consulted for input and recommended use of nebulizers to help with respiratory status.  However patient and wife declined use of this therefore this was discontinued.  He did have issues with progressive hyponatremia and therefore SSRI was  discontinued and Lasix was held briefly.    Nephrology was consulted for input and patient was treated briefly with IV fluids as well as dose of tolvaptan without improvement.  Hypertonic saline was recommended therefore his stay was interrupted from 08/11-08/12 by transfer to acute acute need of telemetry.  Decision was made to treat patient with tolvaptan instead and his sodium was showing improvement he was transferred back to CIR on 08/12 to resume his rehab course.  His sodium has been monitored with serial checks and has stabilized to 1 30-1 33 range.  Follow-up CBC shows reactive leukocytosis has resolved and acute blood loss anemia is stable.  Foley was removed briefly for repeat voiding trial however he continues to require in and out caths due to difficulty voiding.  He did report discomfort due to difficulty with catheterizations secondary to enlarged prostate therefore Foley was replaced.    He was found to be hypothyroid with TSH- 12.2 therefore Synthroid was added for supplement on 08/17.  Blood pressures were monitored on TID basis and have been reasonably controlled.  Heart rate has been stable and no signs of overload noted.  Ego support has been provided by team to help manage elevated levels of anxiety.  Xanax was briefly increased to 3 times daily and has been tapered down to twice daily due to reports of disorientation/disassociation.  Pain is managed with use of tylenol and desensitization techniques. He continues to have report of shortness of breath and fatigue which has been ongoing throughout his stay.  Right AKA site has had minimal serosanguineous drainage which is resolving.  Staples remain in place and stump shrinker was ordered to help with edema control.    Lasix was resumed at 20 g twice daily due to LLE edema which is improving.  Elevation as well as pressure-relief measures were encouraged to help with left heel pain.  Right hip pain has improved with use of Voltaren gel for  local measures. He is showing improvement with use of Trilegy at nights and needs encouragement to use it during the day. He has been making slow progress but continues to be limited by anxiety, weakness and respiratory issues. Wife is unable to provide extensive care needed and has elected on SNF for further therapy. Bed [facility] available at [facility] place and he was discharged to facility 09/01/19 for progressive therapies.     Rehab course: During patient's stay in rehab weekly team conferences were held to monitor patient's progress, set goals and discuss barriers to discharge. At admission, patient required mod assist with mobilty and with basic ADL tasks. He  has had improvement in activity tolerance, balance, postural control as well as ability to compensate for deficits.  He requires extended amount of time with min assist and cues to complete ADL tasks. He is able to perform squat pivot transfers with min assist and verbal cues. He requires frequent  rest breaks due to endurance issues. He is able to stand for 15 seconds X 2 attempts.   Disposition: Skilled Nursing Facility  Diet: heart healthy.   Special Instructions: 1. Limit fluids to 1500 cc/day. Monitor weights daily 2. Offer supplements between meals.  3. Encourage pressure relief measures left heel--wear prevalon boot when in bed or off load heel.  4. Boost every 20-30 minutes when in chair and side lie when in bed.  5. Monitor sodium levels with serial checks. Contact Neprology for input if needed. 6. Recheck TSH in 3-4 weeks.  7. Use Trilegy when napping and at nights.    Discharge Instructions    Ambulatory referral to Physical Medicine Rehab   Complete by: As directed    3-4  weeks follow up appointment      Allergies as of 09/01/2019      Reactions   Cefdinir Diarrhea, Other (See Comments)   Severe Diarrhea   Nitrofurantoin Swelling, Other (See Comments)   Hand Swelling   Sulfa Antibiotics Anaphylaxis, Swelling    Sulfonamide Derivatives Swelling, Other (See Comments)   Facial/tongue swelling   Tape Other (See Comments)   SKIN IS VERY THIN AND TEARS EASILY!!!!! Please do NOT use "plastic" tape- USE PAPER!!   Ciprofloxacin Itching, Rash, Other (See Comments)   Red itchy hands   Nitrofurantoin Swelling, Other (See Comments)   Swollen hands   Amoxicillin Er Other (See Comments)   Frequest urination   Doxycycline Other (See Comments)   "Felt terrible"   Levaquin [levofloxacin] Itching, Rash   Sertraline Anxiety, Other (See Comments)   Makes the patient jittery      Medication List    STOP taking these medications   mometasone 50 MCG/ACT nasal spray Commonly known as: NASONEX     TAKE these medications   ALPRAZolam 0.25 MG tablet--Rx # 15 pills  Commonly known as: XANAX Take one tab by mouth bid prn anxiety What changed: additional instructions   aspirin 81 MG tablet Take 81 mg by mouth daily.   atorvastatin 20 MG tablet Commonly known as: LIPITOR TAKE 1 TABLET BY MOUTH EVERY DAY What changed: when to take this   azithromycin 250 MG tablet Commonly known as: ZITHROMAX Continue home regimen   calcium carbonate 500 MG chewable tablet Commonly known as: TUMS - dosed in mg elemental calcium Chew 1 tablet (200 mg of elemental calcium total) by mouth 2 (two) times daily as needed for indigestion or heartburn.   diclofenac Sodium 1 % Gel Commonly known as: VOLTAREN Apply 2 g topically 4 (four) times daily as needed.   feeding supplement (ENSURE ENLIVE) Liqd Take 237 mLs by mouth 3 (three) times daily between meals.   finasteride 5 MG tablet Commonly known as: PROSCAR Take 5 mg by mouth daily with lunch.   fluticasone 50 MCG/ACT nasal spray Commonly known as: FLONASE Place 2 sprays into both nostrils daily.   furosemide 20 MG tablet Commonly known as: LASIX Take 1 tablet (20 mg total) by mouth 2 (two) times daily.   hydrocerin Crea Apply 1 application topically 2 (two)  times daily. To foot   levalbuterol 45 MCG/ACT inhaler Commonly known as: Xopenex HFA Inhale 2 puffs into the lungs every 4 (four) hours as needed for wheezing or shortness of breath.   levothyroxine 25 MCG tablet Commonly known as: SYNTHROID Take 1 tablet (25 mcg total) by mouth daily at 6 (six) AM. Start taking on: September 02, 2019   lidocaine 2 % jelly Commonly  known as: XYLOCAINE Apply 1 application topically 2 times daily at 12 noon and 4 pm. To buttocks   lidocaine 5 % Commonly known as: LIDODERM Apply to left heel at 8 pm and remove at 8 am daily   melatonin 3 MG Tabs tablet Take 1 tablet (3 mg total) by mouth at bedtime.   metoprolol succinate 25 MG 24 hr tablet Commonly known as: TOPROL-XL Take 0.5 tablets (12.5 mg total) by mouth daily.   Nitrostat 0.4 MG SL tablet Generic drug: nitroGLYCERIN Place 0.4 mg under the tongue every 5 (five) minutes as needed for chest pain.   OXYGEN Inhale 2 L/min into the lungs continuous.   pantoprazole 20 MG tablet Commonly known as: PROTONIX Take 1 tablet (20 mg total) by mouth daily. Start taking on: September 02, 2019   Pepcid 20 MG tablet Generic drug: famotidine Take 20 mg by mouth at bedtime.   polyethylene glycol 17 g packet Commonly known as: MIRALAX / GLYCOLAX Take 17 g by mouth daily.   potassium chloride SA 20 MEQ tablet Commonly known as: KLOR-CON Take 2 tablets (40 mEq total) by mouth daily.   predniSONE 10 MG tablet Commonly known as: DELTASONE FOR FLARE OF COUGH/WHEEZING MAY INCREASE TO 2 DAILY BETTER, THEN 1 DAILY What changed:   how much to take  how to take this  when to take this  additional instructions   PreserVision AREDS 2 Caps Take 1 capsule by mouth daily.   sodium chloride 5 % ophthalmic solution Commonly known as: MURO 128 Place 1 drop into both eyes 4 (four) times daily.   Spiriva Respimat 2.5 MCG/ACT Aers Generic drug: Tiotropium Bromide Monohydrate INHALE 2 PUFFS BY MOUTH EVERY  DAY What changed:   how much to take  how to take this  when to take this  additional instructions   spironolactone 25 MG tablet Commonly known as: ALDACTONE Take 12.5-25 mg by mouth See admin instructions. 12.5 mg on day and 25 mg the next day, pt alternates dosages   Symbicort 160-4.5 MCG/ACT inhaler Generic drug: budesonide-formoterol Inhale 2 puffs into the lungs 2 (two) times daily.   Systane 0.4-0.3 % Soln Generic drug: Polyethyl Glycol-Propyl Glycol Apply 1 drop to eye 2 (two) times daily as needed (eye irritation).   vitamin B-12 1000 MCG tablet Commonly known as: CYANOCOBALAMIN Take 1,000 mcg by mouth daily.   Vitamin D3 50 MCG (2000 UT) Tabs Take 2,000 Units by mouth daily with lunch.   Zinc Oxide 12.8 % ointment Commonly known as: TRIPLE PASTE Apply topically as needed for irritation. To gluteal cleft/buttocks       Contact information for follow-up providers    Jamse Arn, MD Follow up.   Specialty: Physical Medicine and Rehabilitation Why: Office will call you with follow up appointment Contact information: [address] [place] [address] [phone]        Angelia Mould, MD. Call.   Specialties: Vascular Surgery, Cardiology Why: for post op appointment Contact information: [place] [address] [phone]        [name], MD. Call.   Specialty: Family Medicine Why: for post hospital follow up Contact information: [place] [address] [phone]        Belva Crome, MD .   Specialty: Cardiology Contact information: [id number] [address] [place] [address] [phone]        Dwana Melena, MD. Call.   Specialty:  Nephrology Why: Input on hyponateremia prn Contact information: [place] [address] [phone]            Contact information for after-discharge care    Destination    [facility] Preferred  SNF .   Service: Skilled Nursing Contact information: [place] [phone]                  Signed: Bary Leriche 09/01/2019, 11:34 AM

## 2019-09-01 NOTE — Progress Notes (Signed)
[facility] PHYSICAL MEDICINE & REHABILITATION PROGRESS NOTE  Subjective/Complaints: Sitting at EOB comfortably , wife at bedside  ROS: Patient denies CP, SOB, N/V/D Objective: Vital Signs: Blood pressure (!) 119/58, pulse 95, temperature 97.8 F (36.6 C), resp. rate 19, height 5\' 9"  (1.753 m), weight 55 kg, SpO2 98 %. No results found. Recent Labs    09/01/19 0509  WBC 7.8  HGB 8.8*  HCT 27.9*  PLT 303   Recent Labs    08/30/19 0504 09/01/19 0509  NA 133* 132*  K 4.3 4.3  CL 92* 89*  CO2 32 34*  GLUCOSE 90 90  BUN 23 30*  CREATININE 0.90 1.08  CALCIUM 9.5 10.0    Physical Exam: BP (!) 119/58   Pulse 95   Temp 97.8 F (36.6 C)   Resp 19   Ht 5\' 9"  (1.753 m)   Wt 55 kg   SpO2 98%   BMI 17.91 kg/m  Constitutional: No distress . Vital signs reviewed. HEENT: EOMI, oral membranes moist Neck: supple Cardiovascular: RRR without murmur. No JVD    Respiratory/Chest: CTA Bilaterally without wheezes or rales. Normal effort    GI/Abdomen: BS +, non-tender, non-distended Ext: no clubbing, cyanosis, or edema Psych: pleasant and cooperative Skin: Right AKA with staples CDI Coccyx stage II ulcer not examined today Left heel stage II with mild serosanguineous drainage--dressed Psych: Normal mood.  Normal behavior. Musc: Right AKA with improving edema and tenderness Left lower extremity edema improving Neuro:arouses easily Extreme HOH Motor: Bilateral upper extremities: 4/5 proximal distally, stable  Left lower extremity: 4-/5 proximal distal, stable Right lower extremity: Hip flexion, knee extension 3+/5 (some pain patient), improving  Assessment/Plan: 1. Functional deficits secondary to right AKA  Stable for D/C today to SNF F/u PCP in 3-4 weeks  See D/C summary See D/C instructions  Care Tool:  Bathing    Body parts bathed by patient: Right arm, Left arm, Chest, Abdomen, Front perineal area, Right upper leg, Left upper leg, Face   Body parts bathed by  helper: Left lower leg, Buttocks Body parts n/a: Right lower leg   Bathing assist Assist Level: Minimal Assistance - Patient > 75%     Upper Body Dressing/Undressing Upper body dressing   What is the patient wearing?: Pull over shirt    Upper body assist Assist Level: Set up assist    Lower Body Dressing/Undressing Lower body dressing      What is the patient wearing?: Underwear/pull up, Pants     Lower body assist Assist for lower body dressing: Minimal Assistance - Patient > 75%     Toileting Toileting    Toileting assist Assist for toileting: Moderate Assistance - Patient 50 - 74%     Transfers Chair/bed transfer  Transfers assist     Chair/bed transfer assist level: Contact Guard/Touching assist Chair/bed transfer assistive device: Programmer, multimedia   Ambulation assist      Assist level: Contact Guard/Touching assist Assistive device: Walker-rolling Max distance: 10   Walk 10 feet activity   Assist     Assist level: Contact Guard/Touching assist Assistive device: Walker-rolling   Walk 50 feet activity   Assist Walk 50 feet with 2 turns activity did not occur: Safety/medical concerns (pain, weakness, fatigue)         Walk 150 feet activity   Assist Walk 150 feet activity did not occur: Safety/medical concerns (pain, weakness, fatigue)         Walk 10 feet on uneven surface  activity   Assist Walk 10 feet on uneven surfaces activity did not occur: Safety/medical concerns (pain, weakness, fatigue)         Wheelchair     Assist Will patient use wheelchair at discharge?: Yes Type of Wheelchair: Manual    Wheelchair assist level: Supervision/Verbal cueing Max wheelchair distance: 150    Wheelchair 50 feet with 2 turns activity    Assist        Assist Level: Supervision/Verbal cueing   Wheelchair 150 feet activity     Assist     Assist Level: Supervision/Verbal cueing      Medical  Problem List and Plan: 1.  Impaired mobility and ADLs secondary to right AKA with significant medical comorbidities   -pt and wife a little apprehensive about SNF , discussed bloodwork, vitals , exam- looks ready  2.  Antithrombotics: -DVT/anticoagulation:  Pharmaceutical: Heparin             -antiplatelet therapy: Aspirin 81mg  daily 3. Pain Management: Has residual limb pain--educate on desensitization techniques.   Norco will be discontinued. Continue tramadol   Lidoderm patch added for left heel on 8/9  Has headaches- could be secondary to caffeine withdrawal: edited diet order to include coffee with caffeine.    Apply warm compress 15 minutes three times per day (ordered)  Lidocaine jelly BID around coccyx sore  Voltaren gel to right hip ordered with improvement  Controlled with meds on 8/23  Monitor with increased exertion. 4. Mood: Appears depressed--team to provide ego support. LCSW to follow for evaluation and support.   Will hold off on antidepressant given sodium levels. Discussed with wife  Wife may spend the night to assist with patient compliance and anxiety.  Xanax to .25mg  TID for anxiety, educated patient, decreased to twice daily on 8/20  8/21-22 xanax reduction seems to have helped somewhat             -antipsychotic agents: N/A 5. Neuropsych: This patient may be capable of making decisions on his own behalf. Intermittently disoriented.  6. Skin/Wound Care:  Monitor wound for healing.    Stump shrinker for edema control   Coccyx injury: reposition frequently 7. Fluids/Electrolytes/Nutrition: Monitor I/Os 8.COPD/chronic hypoxic respiratory failure:   Continues to require supplemental oxygen (baseline)  Chest x-ray personally reviewed, mild effusion  ECG personally reviewed, showing premature complexes  Appreciate Pulm recs  Has been educated by pharmacy  Need to continue to stress compliance 9. BPH/renal calculi:   Continue foley  Continue finasteride.  10.  Anxiety d/o: continue Xanax tid prn. 11. Hyponatremia:   Sodium 130 on 8/20, up to 133 8/21, stable 132 today   Fluid restriction to 1533mL.   Tolvaptan on hold per nephro.   Appreciate Nephro recs. They have signed off  See #15  -recheck BMET Monday  12. ABLA:   Hemoglobin 9.5 on 8/20, mildly diminished at 8.8 today , given age and nutritional status expect slow improvement  13. Nasal congestion: Afrin nasal spray X 3 days.  Added saline spray as well as humidification.   14. HTN: Has history of orthostatic hypotension.  Monitor BP   Continue aldactone 12.5/25 mgQOD and Toprol XL.   Lasix   8/23 bp controlled Vitals:   09/01/19 0828 09/01/19 0857  BP: (!) 119/58   Pulse: 95   Resp:    Temp:    SpO2:  98%   15. CAD/Chronic systolic CHF: Monitor for symptoms with increase in activity. On aldactone, toprol XL, ASA  and  Lipitor Filed Weights   08/28/19 0534 08/31/19 0500 09/01/19 0500  Weight: 55.4 kg 56 kg 55 kg   Lasix restarted per Nephro, increased to BID on 8/19  8/22 weights stable from 55-56kg Now on K+ given  hypo K r/t lasix 16. Transaminitis: Resolved  LFTs WNL on 8/13  Continue to monitor 17.  Severe hypoalbuminemia  Supplement initiated. Pt states he just doesn't have much appetite  -reviewed importance of intake to his recovery  -?mild dysphagia: seems to be only for larger pills per nurse. Nurse educating patient/wife on ways to help swallowing these pills 18.  Chronic sinusitis according to patient's wife has been seen at [facility] who recommended a 26-month course of azithromycin.    Azithromycin at 250 mg/day. Has Afrin spray as well.  19.  Hypothyroidism  Synthroid 20.  Leukocytosis  WBCs 11.4 on 8/20, labs ordered for Monday  Afebrile  LOS: 11 days A FACE TO [place] E Afnan Cadiente 09/01/2019, 11:25 AM

## 2019-09-01 NOTE — Progress Notes (Signed)
Pt is being d/c to SNF. Attempted to give report x3 to facility. Unable to reach admitting nurse. DON number was given, unable to reach due to phone being off and voicemail being full. Discharge instructions given by Nurse. Patient denies any pains upon d/c. Personal belongings and home meds have been packed and sent with patient. Amanda Cockayne, LPN

## 2019-09-02 DIAGNOSIS — S78119A Complete traumatic amputation at level between unspecified hip and knee, initial encounter: Secondary | ICD-10-CM | POA: Diagnosis not present

## 2019-09-02 DIAGNOSIS — D649 Anemia, unspecified: Secondary | ICD-10-CM | POA: Diagnosis not present

## 2019-09-02 DIAGNOSIS — J449 Chronic obstructive pulmonary disease, unspecified: Secondary | ICD-10-CM | POA: Diagnosis not present

## 2019-09-02 DIAGNOSIS — E871 Hypo-osmolality and hyponatremia: Secondary | ICD-10-CM | POA: Diagnosis not present

## 2019-09-02 NOTE — Patient Outreach (Signed)
[facility] Post- Acute Care Coordinator follow up.   Update received from [facility] indicating member likely to stay long term care post SNF.  Will continue to follow while member resides in [facility].    Marthenia Rolling, MSN-Ed, RN,BSN [facility] Acute Care Coordinator [phone] [facility]) [phone]  (Toll free office)

## 2019-09-03 ENCOUNTER — Telehealth: Payer: Self-pay

## 2019-09-04 DIAGNOSIS — D649 Anemia, unspecified: Secondary | ICD-10-CM | POA: Diagnosis not present

## 2019-09-04 DIAGNOSIS — S78119A Complete traumatic amputation at level between unspecified hip and knee, initial encounter: Secondary | ICD-10-CM | POA: Diagnosis not present

## 2019-09-04 DIAGNOSIS — I739 Peripheral vascular disease, unspecified: Secondary | ICD-10-CM | POA: Diagnosis not present

## 2019-09-04 DIAGNOSIS — E871 Hypo-osmolality and hyponatremia: Secondary | ICD-10-CM | POA: Diagnosis not present

## 2019-09-05 ENCOUNTER — Telehealth: Payer: Self-pay

## 2019-09-05 DIAGNOSIS — R3914 Feeling of incomplete bladder emptying: Secondary | ICD-10-CM | POA: Diagnosis not present

## 2019-09-05 DIAGNOSIS — R338 Other retention of urine: Secondary | ICD-10-CM | POA: Diagnosis not present

## 2019-09-05 DIAGNOSIS — N401 Enlarged prostate with lower urinary tract symptoms: Secondary | ICD-10-CM | POA: Diagnosis not present

## 2019-09-05 NOTE — Telephone Encounter (Signed)
(  9:53a) SW completed return call to patient's wife-[name]. She expressed frustration with facility that patient is currently in. There is a care plan meeting scheduled today, but [name] was sure that she wanted to transfer patient to another facility and inquired about other facilities. SW provided additional facilities: [facility], [facility] and [facility]. [name] stated that she had a look in to a few of those facilities already.SW advised [name] to contact her following the meeting at the facility for any additional assistance.

## 2019-09-06 DIAGNOSIS — Z89611 Acquired absence of right leg above knee: Secondary | ICD-10-CM | POA: Diagnosis not present

## 2019-09-06 DIAGNOSIS — I5032 Chronic diastolic (congestive) heart failure: Secondary | ICD-10-CM | POA: Diagnosis not present

## 2019-09-06 DIAGNOSIS — E871 Hypo-osmolality and hyponatremia: Secondary | ICD-10-CM | POA: Diagnosis not present

## 2019-09-06 DIAGNOSIS — L89622 Pressure ulcer of left heel, stage 2: Secondary | ICD-10-CM | POA: Diagnosis not present

## 2019-09-06 DIAGNOSIS — R339 Retention of urine, unspecified: Secondary | ICD-10-CM | POA: Diagnosis not present

## 2019-09-06 DIAGNOSIS — J9611 Chronic respiratory failure with hypoxia: Secondary | ICD-10-CM | POA: Diagnosis not present

## 2019-09-06 DIAGNOSIS — D649 Anemia, unspecified: Secondary | ICD-10-CM | POA: Diagnosis not present

## 2019-09-06 DIAGNOSIS — Z9981 Dependence on supplemental oxygen: Secondary | ICD-10-CM | POA: Diagnosis not present

## 2019-09-06 DIAGNOSIS — I739 Peripheral vascular disease, unspecified: Secondary | ICD-10-CM | POA: Diagnosis not present

## 2019-09-06 DIAGNOSIS — Z466 Encounter for fitting and adjustment of urinary device: Secondary | ICD-10-CM | POA: Diagnosis not present

## 2019-09-06 DIAGNOSIS — I2510 Atherosclerotic heart disease of native coronary artery without angina pectoris: Secondary | ICD-10-CM | POA: Diagnosis not present

## 2019-09-06 DIAGNOSIS — R531 Weakness: Secondary | ICD-10-CM | POA: Diagnosis not present

## 2019-09-06 DIAGNOSIS — R262 Difficulty in walking, not elsewhere classified: Secondary | ICD-10-CM | POA: Diagnosis not present

## 2019-09-06 DIAGNOSIS — I87322 Chronic venous hypertension (idiopathic) with inflammation of left lower extremity: Secondary | ICD-10-CM | POA: Diagnosis not present

## 2019-09-06 DIAGNOSIS — I110 Hypertensive heart disease with heart failure: Secondary | ICD-10-CM | POA: Diagnosis not present

## 2019-09-06 DIAGNOSIS — Z4781 Encounter for orthopedic aftercare following surgical amputation: Secondary | ICD-10-CM | POA: Diagnosis not present

## 2019-09-06 DIAGNOSIS — I480 Paroxysmal atrial fibrillation: Secondary | ICD-10-CM | POA: Diagnosis not present

## 2019-09-06 DIAGNOSIS — N139 Obstructive and reflux uropathy, unspecified: Secondary | ICD-10-CM | POA: Diagnosis not present

## 2019-09-06 DIAGNOSIS — L89152 Pressure ulcer of sacral region, stage 2: Secondary | ICD-10-CM | POA: Diagnosis not present

## 2019-09-08 ENCOUNTER — Ambulatory Visit (INDEPENDENT_AMBULATORY_CARE_PROVIDER_SITE_OTHER): Payer: Medicare Other | Admitting: Physician Assistant

## 2019-09-08 ENCOUNTER — Other Ambulatory Visit: Payer: Self-pay

## 2019-09-08 ENCOUNTER — Encounter: Payer: Self-pay | Admitting: Physician Assistant

## 2019-09-08 DIAGNOSIS — L89152 Pressure ulcer of sacral region, stage 2: Secondary | ICD-10-CM | POA: Diagnosis not present

## 2019-09-08 DIAGNOSIS — I739 Peripheral vascular disease, unspecified: Secondary | ICD-10-CM | POA: Diagnosis not present

## 2019-09-08 DIAGNOSIS — Z4781 Encounter for orthopedic aftercare following surgical amputation: Secondary | ICD-10-CM | POA: Diagnosis not present

## 2019-09-08 DIAGNOSIS — Z89611 Acquired absence of right leg above knee: Secondary | ICD-10-CM

## 2019-09-08 DIAGNOSIS — I110 Hypertensive heart disease with heart failure: Secondary | ICD-10-CM | POA: Diagnosis not present

## 2019-09-08 DIAGNOSIS — L89622 Pressure ulcer of left heel, stage 2: Secondary | ICD-10-CM | POA: Diagnosis not present

## 2019-09-08 NOTE — Progress Notes (Signed)
    Postoperative Visit    History of Present Illness   BIRAN MAYBERRY is a 86 y.o. male who presents for postoperative follow-up for: right above-the-knee amputation by Dr. Scot Dock.(Date: 08/11/19).  The patient's wounds are healed.  The patient notes pain is well controlled.  Patient and wife would like a referral to [facility] to discuss possibility of wearing a prosthetic   For VQI Use Only   PRE-ADM LIVING: Home  AMB STATUS: Wheelchair   Physical Examination   RLE: Stump incision is healed   Medical Decision Making   DAMANTE SPRAGG is a 86 y.o. male who presents s/p right above-the-knee amputation.   R AKA incision healed; staples were removed  Not sure if patient will be able to walk with a prosthetic in the future given frailty and atrophy of muscle groups in L LE however a referral will be made to [facility] at the patient's request to learn more about the process  He may follow up on an as needed basis  Dagoberto Ligas PA-C [facility] Office: [facility] MD: Trula Slade

## 2019-09-09 DIAGNOSIS — I739 Peripheral vascular disease, unspecified: Secondary | ICD-10-CM | POA: Diagnosis not present

## 2019-09-09 DIAGNOSIS — I110 Hypertensive heart disease with heart failure: Secondary | ICD-10-CM | POA: Diagnosis not present

## 2019-09-09 DIAGNOSIS — F419 Anxiety disorder, unspecified: Secondary | ICD-10-CM | POA: Diagnosis not present

## 2019-09-09 DIAGNOSIS — E871 Hypo-osmolality and hyponatremia: Secondary | ICD-10-CM | POA: Diagnosis not present

## 2019-09-09 DIAGNOSIS — L89152 Pressure ulcer of sacral region, stage 2: Secondary | ICD-10-CM | POA: Diagnosis not present

## 2019-09-09 DIAGNOSIS — E876 Hypokalemia: Secondary | ICD-10-CM | POA: Diagnosis not present

## 2019-09-09 DIAGNOSIS — L989 Disorder of the skin and subcutaneous tissue, unspecified: Secondary | ICD-10-CM | POA: Diagnosis not present

## 2019-09-09 DIAGNOSIS — I509 Heart failure, unspecified: Secondary | ICD-10-CM | POA: Diagnosis not present

## 2019-09-09 DIAGNOSIS — R5383 Other fatigue: Secondary | ICD-10-CM | POA: Diagnosis not present

## 2019-09-09 DIAGNOSIS — Z4781 Encounter for orthopedic aftercare following surgical amputation: Secondary | ICD-10-CM | POA: Diagnosis not present

## 2019-09-09 DIAGNOSIS — L89622 Pressure ulcer of left heel, stage 2: Secondary | ICD-10-CM | POA: Diagnosis not present

## 2019-09-09 DIAGNOSIS — D509 Iron deficiency anemia, unspecified: Secondary | ICD-10-CM | POA: Diagnosis not present

## 2019-09-09 DIAGNOSIS — Z89611 Acquired absence of right leg above knee: Secondary | ICD-10-CM | POA: Diagnosis not present

## 2019-09-11 DIAGNOSIS — L89152 Pressure ulcer of sacral region, stage 2: Secondary | ICD-10-CM | POA: Diagnosis not present

## 2019-09-11 DIAGNOSIS — L89622 Pressure ulcer of left heel, stage 2: Secondary | ICD-10-CM | POA: Diagnosis not present

## 2019-09-11 DIAGNOSIS — I739 Peripheral vascular disease, unspecified: Secondary | ICD-10-CM | POA: Diagnosis not present

## 2019-09-11 DIAGNOSIS — Z89611 Acquired absence of right leg above knee: Secondary | ICD-10-CM | POA: Diagnosis not present

## 2019-09-11 DIAGNOSIS — Z4781 Encounter for orthopedic aftercare following surgical amputation: Secondary | ICD-10-CM | POA: Diagnosis not present

## 2019-09-11 DIAGNOSIS — I110 Hypertensive heart disease with heart failure: Secondary | ICD-10-CM | POA: Diagnosis not present

## 2019-09-12 DIAGNOSIS — I739 Peripheral vascular disease, unspecified: Secondary | ICD-10-CM | POA: Diagnosis not present

## 2019-09-12 DIAGNOSIS — L89152 Pressure ulcer of sacral region, stage 2: Secondary | ICD-10-CM | POA: Diagnosis not present

## 2019-09-12 DIAGNOSIS — I110 Hypertensive heart disease with heart failure: Secondary | ICD-10-CM | POA: Diagnosis not present

## 2019-09-12 DIAGNOSIS — L89622 Pressure ulcer of left heel, stage 2: Secondary | ICD-10-CM | POA: Diagnosis not present

## 2019-09-12 DIAGNOSIS — Z89611 Acquired absence of right leg above knee: Secondary | ICD-10-CM | POA: Diagnosis not present

## 2019-09-12 DIAGNOSIS — Z4781 Encounter for orthopedic aftercare following surgical amputation: Secondary | ICD-10-CM | POA: Diagnosis not present

## 2019-09-15 ENCOUNTER — Other Ambulatory Visit: Payer: Self-pay

## 2019-09-15 ENCOUNTER — Emergency Department (HOSPITAL_COMMUNITY)
Admission: EM | Admit: 2019-09-15 | Discharge: 2019-09-15 | Disposition: A | Discharge status: Home / Self Care | Payer: Medicare Other | Attending: Emergency Medicine | Admitting: Emergency Medicine

## 2019-09-15 ENCOUNTER — Encounter (HOSPITAL_COMMUNITY): Payer: Self-pay

## 2019-09-15 DIAGNOSIS — Z955 Presence of coronary angioplasty implant and graft: Secondary | ICD-10-CM | POA: Diagnosis not present

## 2019-09-15 DIAGNOSIS — Z7951 Long term (current) use of inhaled steroids: Secondary | ICD-10-CM | POA: Diagnosis not present

## 2019-09-15 DIAGNOSIS — Z87891 Personal history of nicotine dependence: Secondary | ICD-10-CM | POA: Diagnosis not present

## 2019-09-15 DIAGNOSIS — J449 Chronic obstructive pulmonary disease, unspecified: Secondary | ICD-10-CM | POA: Insufficient documentation

## 2019-09-15 DIAGNOSIS — R339 Retention of urine, unspecified: Secondary | ICD-10-CM | POA: Insufficient documentation

## 2019-09-15 DIAGNOSIS — Z7952 Long term (current) use of systemic steroids: Secondary | ICD-10-CM | POA: Diagnosis not present

## 2019-09-15 DIAGNOSIS — I5022 Chronic systolic (congestive) heart failure: Secondary | ICD-10-CM | POA: Insufficient documentation

## 2019-09-15 DIAGNOSIS — Z79899 Other long term (current) drug therapy: Secondary | ICD-10-CM | POA: Insufficient documentation

## 2019-09-15 DIAGNOSIS — I110 Hypertensive heart disease with heart failure: Secondary | ICD-10-CM | POA: Diagnosis not present

## 2019-09-15 DIAGNOSIS — Z85828 Personal history of other malignant neoplasm of skin: Secondary | ICD-10-CM | POA: Insufficient documentation

## 2019-09-15 DIAGNOSIS — E039 Hypothyroidism, unspecified: Secondary | ICD-10-CM | POA: Insufficient documentation

## 2019-09-15 DIAGNOSIS — Z7982 Long term (current) use of aspirin: Secondary | ICD-10-CM | POA: Diagnosis not present

## 2019-09-15 DIAGNOSIS — I2510 Atherosclerotic heart disease of native coronary artery without angina pectoris: Secondary | ICD-10-CM | POA: Insufficient documentation

## 2019-09-15 DIAGNOSIS — Z79890 Hormone replacement therapy (postmenopausal): Secondary | ICD-10-CM | POA: Insufficient documentation

## 2019-09-15 LAB — URINALYSIS, ROUTINE W REFLEX MICROSCOPIC
Bilirubin Urine: NEGATIVE
Glucose, UA: NEGATIVE mg/dL
Ketones, ur: NEGATIVE mg/dL
Nitrite: NEGATIVE
Protein, ur: NEGATIVE mg/dL
Specific Gravity, Urine: 1.008 (ref 1.005–1.030)
WBC, UA: >50 WBC/hpf — ABNORMAL HIGH (ref 0–5)
pH: 6 (ref 5.0–8.0)

## 2019-09-15 MED ORDER — CEPHALEXIN 500 MG PO CAPS: 500.0000 mg | ORAL_CAPSULE | Freq: Two times a day (BID) | ORAL | 0 refills | Status: AC

## 2019-09-15 NOTE — ED Notes (Signed)
Urine in leg bag is now coolaid colored.  EDP notified

## 2019-09-15 NOTE — ED Provider Notes (Signed)
[facility] DEPT Provider Note   CSN: [id number] Arrival date & time: 09/15/19  1654     History Chief Complaint  Patient presents with  . Urinary Retention   HPI  [name] is a 86 y.o. male with past medical history significant for COPD, chronic hypoxia on 2 L via nasal cannula, heart failure, CAD, BPH, chronic urinary retention with chronic indwelling Foley, recent right AKA who presents for evaluation of urinary retention.  Patient states he noticed some leaking surrounding his urethra with his Foley catheter.  He called [facility], [name] and he is to follow-up with outpatient.  They told him to remove the Foley catheter.  Patient states since then he is only urinating 50-100 approximate cc every time he urinates.  He does not feel like he has completely emptying when he urinates.  He called [facility] again was told to come to the emergency department for Foley catheter placement.  Patient denies any dysuria, hematuria, abdominal pain, diarrhea, seizure, fever, chills, nausea, chest pain, shortness of breath.  Denies additional aggravating or alleviating factors.  Patient states he has had Foley catheter since June due to retention which per his wife states likely due to BPH.  Patient did recently have right AKA.  He denies any drainage, increased pain, paresthesias to the wound.  Denies additional aggravating or relieving factors.  History obtained from patient, wife in room and past medical records. No interpretor was used. HPI     Past Medical History:  Diagnosis Date  . Anxiety   . Arthritis    "generalized" (04/04/2017)  . Asthma   . Basal cell carcinoma    "left ear"  . BPH (benign prostatic hyperplasia)    severe; s/p multiple biopsies  . CAD (coronary artery disease)   . Carotid artery occlusion   . Chronic respiratory failure ([place])   . Chronic rhinitis   . COPD (chronic obstructive pulmonary disease) (HCC)    2L [place] O2   . Diastolic heart failure ([place]) 2019  . Dilation of biliary tract   . Elevated troponin 03/09/2017  . Gallstones   . GERD (gastroesophageal reflux disease)   . History of blood transfusion    "w/his CABG" (04/04/2017)  . History of kidney stones   . Hyperlipidemia   . Hypertension   . On home oxygen therapy    "~ 24/7" (04/04/2017)  . Peripheral vascular disease ([place])   . Pneumonia 2019  . Squamous cell carcinoma of skin 04/23/2013   in situ-Right flank (txpbx)  . Squamous cell carcinoma of skin 01/23/2017   [place]/ in siut- Left ear rim (CX3FU)  . Syncope and collapse   . Ureteral tumor 08/2015   had endoscopic procedure for evaluation, unable to reach for biopsy  . Urinary catheter (Foley) change required     Patient Active Problem List   Diagnosis Date Noted  . Pain of left heel 09/01/2019  . Labile blood pressure   . Anxiety state   . Hypothyroidism   . Right hip pain   . Pressure injury of skin of coccygeal region   . Post-operative pain   . Chronic systolic congestive heart failure ([place])   . Urinary retention due to benign prostatic hyperplasia 08/20/2019  . SOB (shortness of breath)   . Chronic systolic CHF (congestive heart failure) ([place])   . Hypoalbuminemia due to protein-calorie malnutrition ([place])   . Transaminitis   . Acute blood loss anemia   . Hyponatremia   .  Postoperative pain   . Supplemental oxygen dependent   . S/P AKA (above knee amputation) ([place]) 08/14/2019  . Peripheral arterial disease ([place]) 08/11/2019  . Abnormal urinalysis 08/11/2019  . Critical lower limb ischemia 08/11/2019  . DNR (do not resuscitate)   . Voiding difficulty   . Palliative care by specialist   . Pressure injury of skin 06/29/2019  . Cellulitis of left lower extremity 06/28/2019  . Cellulitis of right lower extremity 06/27/2019  . AKI (acute kidney injury) ([place]) 06/27/2019  . Headache 10/16/2018  . Severe major depression without psychotic features ([place]) 09/22/2017  .  Generalized anxiety disorder 09/22/2017  . Preoperative cardiovascular examination 05/10/2017  . Weakness generalized 04/04/2017  . Chest pain 03/24/2017  . AV block, [place] 1 03/24/2017  . CAD (coronary artery disease) 03/24/2017  . Chronic diastolic CHF (congestive heart failure) ([place]) 03/24/2017  . COPD (chronic obstructive pulmonary disease) ([place]) 03/24/2017  . NSTEMI (non-ST elevated myocardial infarction) ([place])   . Elevated troponin 03/09/2017  . Acute on chronic diastolic heart failure ([place])   . Tachycardia 03/08/2017  . Influenza A 02/04/2017  . Cor pulmonale, chronic (HCC)/ clinical dx  09/13/2016  . Orthostatic hypotension 09/26/2015  . Angina at rest [facility] 09/24/2015  . BPH (benign prostatic hyperplasia) 09/13/2015  . GERD (gastroesophageal reflux disease) 09/13/2015  . Anxiety 09/13/2015  . Sinusitis, chronic 09/09/2015  . DOE (dyspnea on exertion) 04/15/2015  . Acute heart failure ([place]) 03/18/2015  . Right calf pain 06/18/2014  . Coronary artery disease involving coronary bypass graft of native heart with angina pectoris ([place])   . Peripheral vascular disease ([place])   . Essential hypertension   . Obstructive sleep apnea   . Rhinitis, chronic 01/29/2007  . COPD   GOLD IV/ 02/steroid dep  01/29/2007  . Chronic respiratory failure with hypoxia and hypercapnia ([place]) 01/29/2007  . Hyperlipidemia     Past Surgical History:  Procedure Laterality Date  . ABDOMINAL AORTOGRAM W/LOWER EXTREMITY Bilateral 06/30/2019   Procedure: ABDOMINAL AORTOGRAM W/LOWER EXTREMITY;  Surgeon: Waynetta Sandy, MD;  Location: [facility];  Service: Cardiovascular;  Laterality: Bilateral;  . AMPUTATION Right 08/11/2019   Procedure: RIGHT ABOVE KNEE AMPUTATION;  Surgeon: Angelia Mould, MD;  Location: [facility];  Service: Vascular;  Laterality: Right;  . BASAL CELL CARCINOMA EXCISION Left    ear  . CARDIAC CATHETERIZATION     "prior to bypass"  . CATARACT EXTRACTION W/  INTRAOCULAR LENS  IMPLANT, BILATERAL Bilateral   . CORONARY ARTERY BYPASS GRAFT  10/2001   LIMA to LAD, SVG to OM1-2, SVG to RCA and PDA  . LITHOTRIPSY    . PROSTATE BIOPSY  Oct. 2014  . TONSILLECTOMY         Family History  Problem Relation Age of Onset  . Heart disease Mother   . Heart disease Father        Before age 93  . Breast cancer Mother   . Cancer Mother        Breast cancer  . Hypertension Mother   . Other Mother        AAA  and   Amputation  . Heart attack Father   . Stroke Brother        x3, still living   . Peripheral vascular disease Brother   . Heart disease Brother   . Hyperlipidemia Brother   . Hypertension Brother   . Heart disease Brother   . Allergies Brother     Social  History   Tobacco Use  . Smoking status: Former Smoker    Packs/day: 2.00    Years: 33.00    Pack years: 66.00    Types: Cigarettes    Quit date: 03/25/1982    Years since quitting: 37.5  . Smokeless tobacco: Never Used  Vaping Use  . Vaping Use: Never used  Substance Use Topics  . Alcohol use: Yes    Alcohol/week: 0.0 standard drinks    Comment: 1.5-3 oz daily in the past, minimal use in the last few months  . Drug use: No    Home Medications Prior to Admission medications   Medication Sig Start Date End Date Taking? Authorizing Provider  ALPRAZolam Duanne Moron) 0.25 MG tablet Take one tab by mouth bid prn anxiety 09/01/19   Love, Ivan Anchors, PA-C  aspirin 81 MG tablet Take 81 mg by mouth daily.    [provider]  atorvastatin (LIPITOR) 20 MG tablet TAKE 1 TABLET BY MOUTH EVERY DAY Patient taking differently: Take 20 mg by mouth at bedtime.  05/24/16   Belva Crome, MD  azithromycin [facility]) 250 MG tablet Continue home regimen 08/22/19   Love, Ivan Anchors, PA-C  calcium carbonate (TUMS - DOSED IN MG ELEMENTAL CALCIUM) 500 MG chewable tablet Chew 1 tablet (200 mg of elemental calcium total) by mouth 2 (two) times daily as needed for indigestion or heartburn. 09/01/19    Love, Ivan Anchors, PA-C  cephALEXin (KEFLEX) 500 MG capsule Take 1 capsule (500 mg total) by mouth 2 (two) times daily for 7 days. 09/15/19 09/22/19  Heywood Tokunaga A, PA-C  Cholecalciferol (VITAMIN D3) 2000 units TABS Take 2,000 Units by mouth daily with lunch.     [provider]  diclofenac Sodium (VOLTAREN) 1 % GEL Apply 2 g topically 4 (four) times daily as needed. 09/01/19   Love, Ivan Anchors, PA-C  famotidine (PEPCID) 20 MG tablet Take 20 mg by mouth at bedtime.     [provider]  feeding supplement, ENSURE ENLIVE, (ENSURE ENLIVE) LIQD Take 237 mLs by mouth 3 (three) times daily between meals. 09/01/19   Love, Ivan Anchors, PA-C  finasteride (PROSCAR) 5 MG tablet Take 5 mg by mouth daily with lunch.     [provider]  fluticasone (FLONASE) 50 MCG/ACT nasal spray Place 2 sprays into both nostrils daily.    [provider]  furosemide (LASIX) 20 MG tablet Take 1 tablet (20 mg total) by mouth 2 (two) times daily. 09/01/19   Love, Ivan Anchors, PA-C  hydrocerin (EUCERIN) CREA Apply 1 application topically 2 (two) times daily. To foot 09/01/19   Love, Ivan Anchors, PA-C  levalbuterol [facility] HFA) 45 MCG/ACT inhaler Inhale 2 puffs into the lungs every 4 (four) hours as needed for wheezing or shortness of breath. 04/14/19   Tanda Rockers, MD  levothyroxine (SYNTHROID) 25 MCG tablet Take 1 tablet (25 mcg total) by mouth daily at 6 (six) AM. 09/02/19   Love, Ivan Anchors, PA-C  lidocaine (LIDODERM) 5 % Apply to left heel at 8 pm and remove at 8 am daily 09/01/19   Love, Ivan Anchors, PA-C  lidocaine (XYLOCAINE) 2 % jelly Apply 1 application topically 2 times daily at 12 noon and 4 pm. To buttocks 09/01/19   Love, Ivan Anchors, PA-C  melatonin 3 MG TABS tablet Take 1 tablet (3 mg total) by mouth at bedtime. 09/01/19   Love, Ivan Anchors, PA-C  metoprolol succinate (TOPROL-XL) 25 MG 24 hr tablet Take 0.5 tablets (12.5  mg total) by mouth daily. 07/11/19   Belva Crome, MD  Multiple Vitamins-Minerals  (PRESERVISION AREDS 2) CAPS Take 1 capsule by mouth daily.    [provider]  NITROSTAT 0.4 MG SL tablet Place 0.4 mg under the tongue every 5 (five) minutes as needed for chest pain.    [provider]  OXYGEN Inhale 2 L/min into the lungs continuous.     [provider]  pantoprazole (PROTONIX) 20 MG tablet Take 1 tablet (20 mg total) by mouth daily. 09/02/19   Love, Ivan Anchors, PA-C  Polyethyl Glycol-Propyl Glycol (SYSTANE) 0.4-0.3 % SOLN Apply 1 drop to eye 2 (two) times daily as needed (eye irritation). 09/01/19   Love, Ivan Anchors, PA-C  polyethylene glycol (MIRALAX / GLYCOLAX) 17 g packet Take 17 g by mouth daily. 09/01/19   Love, Ivan Anchors, PA-C  potassium chloride SA (KLOR-CON) 20 MEQ tablet Take 2 tablets (40 mEq total) by mouth daily. 09/01/19   Love, Ivan Anchors, PA-C  predniSONE (DELTASONE) 10 MG tablet FOR FLARE OF COUGH/WHEEZING MAY INCREASE TO 2 DAILY BETTER, THEN 1 DAILY Patient taking differently: Take 10-20 mg by mouth in the morning. Take 10 mg by mouth in the morning and may increase to 20 mg as needed for coughing/wheezing (then decrease back to 10 mg once daily) 02/25/19   Tanda Rockers, MD  silodosin (RAPAFLO) 8 MG CAPS capsule Take 8 mg by mouth daily. 09/04/19   [provider]  sodium chloride (MURO 128) 5 % ophthalmic solution Place 1 drop into both eyes 4 (four) times daily. 08/22/19   Love, Ivan Anchors, PA-C  spironolactone (ALDACTONE) 25 MG tablet Take 12.5-25 mg by mouth See admin instructions. 12.5 mg on day and 25 mg the next day, pt alternates dosages 07/15/19   [provider]  SYMBICORT 160-4.5 MCG/ACT inhaler Inhale 2 puffs into the lungs 2 (two) times daily. 06/06/19   Tanda Rockers, MD  Tiotropium Bromide Monohydrate (SPIRIVA RESPIMAT) 2.5 MCG/ACT AERS INHALE 2 PUFFS BY MOUTH EVERY DAY Patient taking differently: Inhale 2 puffs into the lungs daily.  05/14/19   Tanda Rockers, MD  vitamin B-12 (CYANOCOBALAMIN) 1000 MCG tablet Take  1,000 mcg by mouth daily.     [provider]  Zinc Oxide (TRIPLE PASTE) 12.8 % ointment Apply topically as needed for irritation. To gluteal cleft/buttocks 09/01/19   Love, Ivan Anchors, PA-C    Allergies    Cefdinir, Nitrofurantoin, Sulfa antibiotics, Sulfonamide derivatives, Tape, Ciprofloxacin, Nitrofurantoin, Amoxicillin er, Doxycycline, Levaquin [levofloxacin], and Sertraline  Review of Systems   Review of Systems  Constitutional: Negative.   HENT: Negative.   Respiratory: Negative.   Cardiovascular: Negative.   Gastrointestinal: Negative.   Genitourinary: Negative.        Urinary retention  Musculoskeletal: Negative.   Skin: Negative.   Neurological: Negative.   All other systems reviewed and are negative.   Physical Exam Updated Vital Signs BP 135/77 (BP Location: Left Arm)   Pulse 95   Temp 98.4 F (36.9 C) (Oral)   Resp 18   Ht 5\' 9"  (1.753 m)   Wt 55 kg   SpO2 100%   BMI 17.91 kg/m   Physical Exam Vitals and nursing note reviewed.  Constitutional:      General: He is not in acute distress.    Appearance: He is well-developed. He is ill-appearing (Chronically ill appearing). He is not toxic-appearing or diaphoretic.  HENT:     Head: Normocephalic and atraumatic.  Nose: Nose normal.     Mouth/Throat:     Mouth: Mucous membranes are moist.  Eyes:     Pupils: Pupils are equal, round, and reactive to light.  Cardiovascular:     Rate and Rhythm: Normal rate and regular rhythm.     Pulses: Normal pulses.     Heart sounds: Normal heart sounds.  Pulmonary:     Effort: Pulmonary effort is normal. No respiratory distress.     Breath sounds: Normal breath sounds.     Comments: 2L [place] Abdominal:     General: Bowel sounds are normal. There is no distension.     Palpations: Abdomen is soft. There is no mass.     Tenderness: There is no abdominal tenderness. There is no right CVA tenderness, left CVA tenderness, guarding or rebound.     Hernia: No hernia is  present.  Musculoskeletal:        General: Normal range of motion.     Cervical back: Normal range of motion and neck supple.     Comments: Moves all 4 extremities at difficulty, right AKA  Skin:    General: Skin is warm and dry.     Comments: No edema, erythema or warmth  Neurological:     General: No focal deficit present.     Mental Status: He is alert.    ED Results / Procedures / Treatments   Labs (all labs ordered are listed, but only abnormal results are displayed) Labs Reviewed  URINALYSIS, ROUTINE W REFLEX MICROSCOPIC - Abnormal; Notable for the following components:      Result Value   Hgb urine dipstick LARGE (*)    Leukocytes,Ua LARGE (*)    WBC, UA >50 (*)    Bacteria, UA RARE (*)    All other components within normal limits  URINE CULTURE    EKG None  Radiology No results found.  Procedures Procedures (including critical care time)  Medications Ordered in ED Medications - No data to display  ED Course  I have reviewed the triage vital signs and the nursing notes.  Pertinent labs & imaging results that were available during my care of the patient were reviewed by me and considered in my medical decision making (see chart for details).  86 year old presents for evaluation of urinary retention.  Has chronic indwelling Foley.  Follow[facility] by [facility].  Noticed some leakage around his urethra earlier today.  Contacted urology who told him to pull the Foley catheter.  He is only been able to urinate small amounts since then.  No dysuria or hematuria.  His abdomen is soft, nontender.  Unfortunately bladder scanner is not working at the current time.  Foley catheter was able to be placed with 300 cc clear urine draining.  No hematuria.  Patient states he feels like he is "empty."  Urinalysis and culture sent.  Does have positive leuks, rare bacteria and greater than 50 WBC with cath urine.  Will start antibiotics and have urology follow for culture.  Patient is  not appear septic.  He is afebrile without tachycardia, tachypnea or hypoxia.  He is on mentation per wife.  Negative CVA tap bilaterally.  Low suspicion for acute intra-abdominal process.  He will follow up with [facility] as previously planned.  The patient has been appropriately medically screened and/or stabilized in the ED. I have low suspicion for any other emergent medical condition which would require further screening, evaluation or treatment in the ED or require inpatient management.  Patient is hemodynamically stable and in no acute distress.  Patient able to ambulate in department prior to ED.  Evaluation does not show acute pathology that would require ongoing or additional emergent interventions while in the emergency department or further inpatient treatment.  I have discussed the diagnosis with the patient and answered all questions.  Pain is been managed while in the emergency department and patient has no further complaints prior to discharge.  Patient is comfortable with plan discussed in room and is stable for discharge at this time.  I have discussed strict return precautions for returning to the emergency department.  Patient was encouraged to follow-up with PCP/specialist refer to at discharge.  Patient seen eval by attending, Dr. Kathrynn Humble who agrees with treatment, plan and  disposition.    MDM Rules/Calculators/A&P                           Final Clinical Impression(s) / ED Diagnoses Final diagnoses:  Urinary retention    Rx / DC Orders ED Discharge Orders         Ordered    cephALEXin (KEFLEX) 500 MG capsule  2 times daily        09/15/19 1759           Sharlyne Koeneman A, PA-C 09/15/19 1834    Varney Biles, MD 09/15/19 2015

## 2019-09-15 NOTE — ED Notes (Signed)
Gave pt an additional bag for foley at night per pt request

## 2019-09-15 NOTE — ED Notes (Signed)
Patient voided 50 cc urine with sediment.

## 2019-09-15 NOTE — Discharge Instructions (Addendum)
Follow-up with [facility]  Take the antibiotics as prescribed

## 2019-09-15 NOTE — ED Notes (Signed)
Applied leg bag and emptied 170cc urine.  Pt requests a larger bag for night time.  Traditional bag given for pt to use to exchange.

## 2019-09-15 NOTE — ED Triage Notes (Signed)
Patient reports that his urinary catheter was leaking in the night and when he called the urologist, Patient was instructed to pull the catheter.  Patient has only urinated approx 50 ml since 0900 today. Patient was instructed to come to the ED to get a new catheter.

## 2019-09-16 DIAGNOSIS — Z4781 Encounter for orthopedic aftercare following surgical amputation: Secondary | ICD-10-CM | POA: Diagnosis not present

## 2019-09-16 DIAGNOSIS — L89152 Pressure ulcer of sacral region, stage 2: Secondary | ICD-10-CM | POA: Diagnosis not present

## 2019-09-16 DIAGNOSIS — I739 Peripheral vascular disease, unspecified: Secondary | ICD-10-CM | POA: Diagnosis not present

## 2019-09-16 DIAGNOSIS — I110 Hypertensive heart disease with heart failure: Secondary | ICD-10-CM | POA: Diagnosis not present

## 2019-09-16 DIAGNOSIS — L89622 Pressure ulcer of left heel, stage 2: Secondary | ICD-10-CM | POA: Diagnosis not present

## 2019-09-16 DIAGNOSIS — Z89611 Acquired absence of right leg above knee: Secondary | ICD-10-CM | POA: Diagnosis not present

## 2019-09-17 DIAGNOSIS — R338 Other retention of urine: Secondary | ICD-10-CM | POA: Diagnosis not present

## 2019-09-17 LAB — URINE CULTURE: Culture: >=100000 — AB

## 2019-09-18 ENCOUNTER — Telehealth: Payer: Self-pay

## 2019-09-18 DIAGNOSIS — L89622 Pressure ulcer of left heel, stage 2: Secondary | ICD-10-CM | POA: Diagnosis not present

## 2019-09-18 DIAGNOSIS — Z4781 Encounter for orthopedic aftercare following surgical amputation: Secondary | ICD-10-CM | POA: Diagnosis not present

## 2019-09-18 DIAGNOSIS — I110 Hypertensive heart disease with heart failure: Secondary | ICD-10-CM | POA: Diagnosis not present

## 2019-09-18 DIAGNOSIS — L89152 Pressure ulcer of sacral region, stage 2: Secondary | ICD-10-CM | POA: Diagnosis not present

## 2019-09-18 DIAGNOSIS — I739 Peripheral vascular disease, unspecified: Secondary | ICD-10-CM | POA: Diagnosis not present

## 2019-09-18 DIAGNOSIS — Z89611 Acquired absence of right leg above knee: Secondary | ICD-10-CM | POA: Diagnosis not present

## 2019-09-18 NOTE — Telephone Encounter (Signed)
PT with + UC from ED 09/15/19  Pharm D Wollord sopke to NP at [facility] who will call pt and prescribe appro abx per Epic note

## 2019-09-18 NOTE — Progress Notes (Signed)
ED Antimicrobial Stewardship Positive Culture Follow Up   [name] is an 86 y.o. male who presented to [facility] on 09/15/2019 with a chief complaint of  Chief Complaint  Patient presents with  . Urinary Retention    Recent Results (from the past 720 hour(s))  Urine Culture     Status: Abnormal   Collection Time: 08/19/19 11:55 AM   Specimen: Urine, Random  Result Value Ref Range Status   Specimen Description URINE, RANDOM  Final   Special Requests   Final    NONE Performed at [facility], [address] [place], [place] [address]    Culture MULTIPLE SPECIES PRESENT, SUGGEST RECOLLECTION (A)  Final   Report Status 08/20/2019 FINAL  Final  SARS CORONAVIRUS 2 (TAT 6-24 HRS) Nasopharyngeal Nasopharyngeal Swab     Status: None   Collection Time: 08/30/19  2:52 PM   Specimen: Nasopharyngeal Swab  Result Value Ref Range Status   SARS Coronavirus 2 NEGATIVE NEGATIVE Final    Comment: (NOTE) SARS-CoV-2 target nucleic acids are NOT DETECTED.  The SARS-CoV-2 RNA is generally detectable in upper and lower respiratory specimens during the acute phase of infection. Negative results do not preclude SARS-CoV-2 infection, do not rule out co-infections with other pathogens, and should not be used as the sole basis for treatment or other patient management decisions. Negative results must be combined with clinical observations, patient history, and epidemiological information. The expected result is Negative.  Fact Sheet for Patients: SugarRoll.be  Fact Sheet for Healthcare Providers: https://www.woods-mathews.com/  This test is not yet approved or cleared by the [place] FDA and  has been authorized for detection and/or diagnosis of SARS-CoV-2 by FDA under an Emergency Use Authorization (EUA). This EUA will remain  in effect (meaning this test can be used) for the duration of the COVID-19 declaration under Se ction 564(b)(1) of  the Act, 21 U.S.C. section 360bbb-3(b)(1), unless the authorization is terminated or revoked sooner.  Performed at [facility], [place] [address], [place], [place] [address]   Urine culture     Status: Abnormal   Collection Time: 09/15/19  5:30 PM   Specimen: Urine, Random  Result Value Ref Range Status   Specimen Description   Final    URINE, RANDOM Performed at [facility] [address], [place], [place] [address]    Special Requests   Final    NONE Performed at [facility], [place] [address] [place],  [address]    Culture >=100,000 COLONIES/mL ENTEROCOCCUS FAECALIS (A)  Final   Report Status 09/17/2019 FINAL  Final   Organism ID, Bacteria ENTEROCOCCUS FAECALIS (A)  Final      Susceptibility   Enterococcus faecalis - MIC*    AMPICILLIN <=2 SENSITIVE Sensitive     NITROFURANTOIN <=16 SENSITIVE Sensitive     VANCOMYCIN 2 SENSITIVE Sensitive     * >=100,000 COLONIES/mL ENTEROCOCCUS FAECALIS    [x]  Treated with Keflex, organism resistant to prescribed antimicrobial  Recommend stop Keflex, start Amoxicillin 500 mg PO bid x 5 days  Contacted NP Daine Gravel at [facility] (who had just seen patient on 9/8) and discussed culture results. NP will contact patient to make final assessment and prescribe Amoxicillin as above as appropriate.   [name] A 09/18/2019, 10:36 AM Clinical Pharmacist [phone]

## 2019-09-19 DIAGNOSIS — L89622 Pressure ulcer of left heel, stage 2: Secondary | ICD-10-CM | POA: Diagnosis not present

## 2019-09-19 DIAGNOSIS — Z89611 Acquired absence of right leg above knee: Secondary | ICD-10-CM | POA: Diagnosis not present

## 2019-09-19 DIAGNOSIS — I110 Hypertensive heart disease with heart failure: Secondary | ICD-10-CM | POA: Diagnosis not present

## 2019-09-19 DIAGNOSIS — L89152 Pressure ulcer of sacral region, stage 2: Secondary | ICD-10-CM | POA: Diagnosis not present

## 2019-09-19 DIAGNOSIS — I739 Peripheral vascular disease, unspecified: Secondary | ICD-10-CM | POA: Diagnosis not present

## 2019-09-19 DIAGNOSIS — Z4781 Encounter for orthopedic aftercare following surgical amputation: Secondary | ICD-10-CM | POA: Diagnosis not present

## 2019-09-20 DIAGNOSIS — L89152 Pressure ulcer of sacral region, stage 2: Secondary | ICD-10-CM | POA: Diagnosis not present

## 2019-09-20 DIAGNOSIS — L89622 Pressure ulcer of left heel, stage 2: Secondary | ICD-10-CM | POA: Diagnosis not present

## 2019-09-20 DIAGNOSIS — Z89611 Acquired absence of right leg above knee: Secondary | ICD-10-CM | POA: Diagnosis not present

## 2019-09-20 DIAGNOSIS — I739 Peripheral vascular disease, unspecified: Secondary | ICD-10-CM | POA: Diagnosis not present

## 2019-09-20 DIAGNOSIS — I110 Hypertensive heart disease with heart failure: Secondary | ICD-10-CM | POA: Diagnosis not present

## 2019-09-20 DIAGNOSIS — Z4781 Encounter for orthopedic aftercare following surgical amputation: Secondary | ICD-10-CM | POA: Diagnosis not present

## 2019-09-22 ENCOUNTER — Ambulatory Visit (INDEPENDENT_AMBULATORY_CARE_PROVIDER_SITE_OTHER): Payer: Medicare Other | Admitting: Nurse Practitioner

## 2019-09-22 ENCOUNTER — Encounter: Payer: Self-pay | Admitting: Nurse Practitioner

## 2019-09-22 ENCOUNTER — Other Ambulatory Visit: Payer: Self-pay

## 2019-09-22 ENCOUNTER — Telehealth: Payer: Self-pay | Admitting: Nurse Practitioner

## 2019-09-22 VITALS — BP 120/50 | HR 97 | Ht 69.0 in

## 2019-09-22 DIAGNOSIS — R600 Localized edema: Secondary | ICD-10-CM | POA: Diagnosis not present

## 2019-09-22 DIAGNOSIS — I110 Hypertensive heart disease with heart failure: Secondary | ICD-10-CM | POA: Diagnosis not present

## 2019-09-22 DIAGNOSIS — I779 Disorder of arteries and arterioles, unspecified: Secondary | ICD-10-CM

## 2019-09-22 DIAGNOSIS — I739 Peripheral vascular disease, unspecified: Secondary | ICD-10-CM | POA: Diagnosis not present

## 2019-09-22 DIAGNOSIS — Z79899 Other long term (current) drug therapy: Secondary | ICD-10-CM | POA: Diagnosis not present

## 2019-09-22 DIAGNOSIS — R7989 Other specified abnormal findings of blood chemistry: Secondary | ICD-10-CM | POA: Diagnosis not present

## 2019-09-22 DIAGNOSIS — I5032 Chronic diastolic (congestive) heart failure: Secondary | ICD-10-CM | POA: Diagnosis not present

## 2019-09-22 DIAGNOSIS — L89622 Pressure ulcer of left heel, stage 2: Secondary | ICD-10-CM | POA: Diagnosis not present

## 2019-09-22 DIAGNOSIS — Z4781 Encounter for orthopedic aftercare following surgical amputation: Secondary | ICD-10-CM | POA: Diagnosis not present

## 2019-09-22 DIAGNOSIS — L89152 Pressure ulcer of sacral region, stage 2: Secondary | ICD-10-CM | POA: Diagnosis not present

## 2019-09-22 DIAGNOSIS — Z89611 Acquired absence of right leg above knee: Secondary | ICD-10-CM

## 2019-09-22 MED ORDER — SPIRONOLACTONE 25 MG PO TABS: 25.0000 mg | ORAL_TABLET | Freq: Every day | ORAL | Status: DC

## 2019-09-22 MED ORDER — POTASSIUM CHLORIDE CRYS ER 20 MEQ PO TBCR: 20.0000 meq | EXTENDED_RELEASE_TABLET | Freq: Every day | ORAL | Status: DC

## 2019-09-22 NOTE — Progress Notes (Signed)
CARDIOLOGY OFFICE NOTE  Date:  09/22/2019    [name] Date of Birth: 12-28-1929 Medical Record #[id number]  PCP:  [name], MD  Cardiologist:  Jennings Books  Chief Complaint  Patient presents with  . Edema    History of Present Illness: [name] is a 86 y.o. male who presents today for a work in visit. Seen for Dr. Tamala Julian.   He has a known history of CAD with prior CABG, ICM, severe COPD with cor pulmonale, HTN, HLD, PAF, PAD and carotid artery disease.Echocardiogram obtained on 10/08/2018 showed EF>55%, bicuspid aorticvalve. He has had prior ischemicright rightfoot.  Last seen by Dr. Tamala Julian in April - lots of pain in the lower extremities. Ischemic ulcer on the right great toe. No interventional approaches are possible given his COPD, hypoxic state and ICM. Noted to be pretty desperate.   I then saw him for a work in back in June - having more edema/weeping - really felt to be declining overall. Wife was exhausted - no real help available to them. He was not ready to have amputation of his leg.    He was admitted later in June - ended up going home with palliative care. Ended up having right AKA in early August by [name]. Discharged to in patient rehab - complicated by hyponatremia (required 3% hypertonic saline) - then discharged to SNF. He has had urinary retention and mild COPD exacerbations as well.   Comes in today. Here with his wife. She augments the history - she brought him home about 3 weeks due to the poor conditions at SNF. She has some visiting nurses. He is wanting to "make sure he is on the right track" - thinks he needs labs. Has been started on some low dose Synthroid. He is pretty tired as a general rule. Does some limited exercises recommended by PT. He can stand a little. She notes he transfers very well. He thinks his breathing may be a bit worse. He is sitting most of the day in his chair. No cough. No sputum. Still with foley in  place. Unclear if he will be getting a prosthesis.   Past Medical History:  Diagnosis Date  . Anxiety   . Arthritis    "generalized" (04/04/2017)  . Asthma   . Basal cell carcinoma    "left ear"  . BPH (benign prostatic hyperplasia)    severe; s/p multiple biopsies  . CAD (coronary artery disease)   . Carotid artery occlusion   . Chronic respiratory failure ([place])   . Chronic rhinitis   . COPD (chronic obstructive pulmonary disease) (HCC)    2L [place] O2  . Diastolic heart failure ([place]) 2019  . Dilation of biliary tract   . Elevated troponin 03/09/2017  . Gallstones   . GERD (gastroesophageal reflux disease)   . History of blood transfusion    "w/his CABG" (04/04/2017)  . History of kidney stones   . Hyperlipidemia   . Hypertension   . On home oxygen therapy    "~ 24/7" (04/04/2017)  . Peripheral vascular disease ([place])   . Pneumonia 2019  . Squamous cell carcinoma of skin 04/23/2013   in situ-Right flank (txpbx)  . Squamous cell carcinoma of skin 01/23/2017   [place]/ in siut- Left ear rim (CX3FU)  . Syncope and collapse   . Ureteral tumor 08/2015   had endoscopic procedure for evaluation, unable to reach for biopsy  . Urinary catheter (Foley) change  required     Past Surgical History:  Procedure Laterality Date  . ABDOMINAL AORTOGRAM W/LOWER EXTREMITY Bilateral 06/30/2019   Procedure: ABDOMINAL AORTOGRAM W/LOWER EXTREMITY;  Surgeon: Waynetta Sandy, MD;  Location: [facility];  Service: Cardiovascular;  Laterality: Bilateral;  . AMPUTATION Right 08/11/2019   Procedure: RIGHT ABOVE KNEE AMPUTATION;  Surgeon: Angelia Mould, MD;  Location: [facility];  Service: Vascular;  Laterality: Right;  . BASAL CELL CARCINOMA EXCISION Left    ear  . CARDIAC CATHETERIZATION     "prior to bypass"  . CATARACT EXTRACTION W/ INTRAOCULAR LENS  IMPLANT, BILATERAL Bilateral   . CORONARY ARTERY BYPASS GRAFT  10/2001   LIMA to LAD, SVG to OM1-2, SVG to RCA and PDA  . LITHOTRIPSY     . PROSTATE BIOPSY  Oct. 2014  . TONSILLECTOMY       Medications: Current Meds  Medication Sig  . ALPRAZolam (XANAX) 0.25 MG tablet Take one tab by mouth bid prn anxiety  . amoxicillin (AMOXIL) 500 MG tablet Take 500 mg by mouth 2 (two) times daily. Take one tablet twice a day for 5 days  . aspirin 81 MG tablet Take 81 mg by mouth daily.  Marland Kitchen atorvastatin (LIPITOR) 20 MG tablet TAKE 1 TABLET BY MOUTH EVERY DAY (Patient taking differently: Take 20 mg by mouth at bedtime. )  . azithromycin (ZITHROMAX) 250 MG tablet Continue home regimen  . calcium carbonate (TUMS - DOSED IN MG ELEMENTAL CALCIUM) 500 MG chewable tablet Chew 1 tablet (200 mg of elemental calcium total) by mouth 2 (two) times daily as needed for indigestion or heartburn.  . cephALEXin (KEFLEX) 500 MG capsule Take 1 capsule (500 mg total) by mouth 2 (two) times daily for 7 days.  . Cholecalciferol (VITAMIN D3) 2000 units TABS Take 2,000 Units by mouth daily with lunch.   . diclofenac Sodium (VOLTAREN) 1 % GEL Apply 2 g topically 4 (four) times daily as needed.  . famotidine (PEPCID) 20 MG tablet Take 20 mg by mouth at bedtime.   . feeding supplement, ENSURE ENLIVE, (ENSURE ENLIVE) LIQD Take 237 mLs by mouth 3 (three) times daily between meals.  . finasteride (PROSCAR) 5 MG tablet Take 5 mg by mouth daily with lunch.   . fluticasone (FLONASE) 50 MCG/ACT nasal spray Place 2 sprays into both nostrils daily.  . furosemide (LASIX) 20 MG tablet Take 1 tablet (20 mg total) by mouth 2 (two) times daily.  . hydrocerin (EUCERIN) CREA Apply 1 application topically 2 (two) times daily. To foot  . levalbuterol (XOPENEX HFA) 45 MCG/ACT inhaler Inhale 2 puffs into the lungs every 4 (four) hours as needed for wheezing or shortness of breath.  . levothyroxine (SYNTHROID) 25 MCG tablet Take 1 tablet (25 mcg total) by mouth daily at 6 (six) AM.  . lidocaine (LIDODERM) 5 % Apply to left heel at 8 pm and remove at 8 am daily  . lidocaine (XYLOCAINE)  2 % jelly Apply 1 application topically 2 times daily at 12 noon and 4 pm. To buttocks  . melatonin 3 MG TABS tablet Take 1 tablet (3 mg total) by mouth at bedtime.  . metoprolol succinate (TOPROL-XL) 25 MG 24 hr tablet Take 0.5 tablets (12.5 mg total) by mouth daily.  . Multiple Vitamins-Minerals (PRESERVISION AREDS 2) CAPS Take 1 capsule by mouth daily.  Marland Kitchen NITROSTAT 0.4 MG SL tablet Place 0.4 mg under the tongue every 5 (five) minutes as needed for chest pain.  . OXYGEN Inhale  2 L/min into the lungs continuous.   . pantoprazole (PROTONIX) 20 MG tablet Take 1 tablet (20 mg total) by mouth daily.  Vladimir Faster Glycol-Propyl Glycol (SYSTANE) 0.4-0.3 % SOLN Apply 1 drop to eye 2 (two) times daily as needed (eye irritation).  . polyethylene glycol (MIRALAX / GLYCOLAX) 17 g packet Take 17 g by mouth daily.  . potassium chloride SA (KLOR-CON) 20 MEQ tablet Take 1 tablet (20 mEq total) by mouth daily.  . predniSONE (DELTASONE) 10 MG tablet FOR FLARE OF COUGH/WHEEZING MAY INCREASE TO 2 DAILY BETTER, THEN 1 DAILY (Patient taking differently: Take 10-20 mg by mouth in the morning. Take 10 mg by mouth in the morning and may increase to 20 mg as needed for coughing/wheezing (then decrease back to 10 mg once daily))  . silodosin (RAPAFLO) 8 MG CAPS capsule Take 8 mg by mouth daily.  . sodium chloride (MURO 128) 5 % ophthalmic solution Place 1 drop into both eyes 4 (four) times daily.  Marland Kitchen spironolactone (ALDACTONE) 25 MG tablet Take 1 tablet (25 mg total) by mouth daily. 12.5 mg on day and 25 mg the next day, pt alternates dosages  . SYMBICORT 160-4.5 MCG/ACT inhaler Inhale 2 puffs into the lungs 2 (two) times daily.  . Tiotropium Bromide Monohydrate (SPIRIVA RESPIMAT) 2.5 MCG/ACT AERS INHALE 2 PUFFS BY MOUTH EVERY DAY (Patient taking differently: Inhale 2 puffs into the lungs daily. )  . vitamin B-12 (CYANOCOBALAMIN) 1000 MCG tablet Take 1,000 mcg by mouth daily.   . Zinc Oxide (TRIPLE PASTE) 12.8 % ointment  Apply topically as needed for irritation. To gluteal cleft/buttocks  . [DISCONTINUED] potassium chloride SA (KLOR-CON) 20 MEQ tablet Take 2 tablets (40 mEq total) by mouth daily.  . [DISCONTINUED] spironolactone (ALDACTONE) 25 MG tablet Take 12.5-25 mg by mouth See admin instructions. 12.5 mg on day and 25 mg the next day, pt alternates dosages     Allergies: Allergies  Allergen Reactions  . Cefdinir Diarrhea and Other (See Comments)    Severe Diarrhea  . Nitrofurantoin Swelling and Other (See Comments)    Hand Swelling  . Sulfa Antibiotics Anaphylaxis and Swelling  . Sulfonamide Derivatives Swelling and Other (See Comments)    Facial/tongue swelling  . Tape Other (See Comments)    SKIN IS VERY THIN AND TEARS EASILY!!!!! Please do NOT use "plastic" tape- USE PAPER!!  . Ciprofloxacin Itching, Rash and Other (See Comments)    Red itchy hands  . Nitrofurantoin Swelling and Other (See Comments)    Swollen hands  . Amoxicillin Er Other (See Comments)    Frequest urination  . Doxycycline Other (See Comments)    "Felt terrible"  . Levaquin [Levofloxacin] Itching and Rash  . Sertraline Anxiety and Other (See Comments)    Makes the patient jittery    Social History: The patient  reports that he quit smoking about 37 years ago. His smoking use included cigarettes. He has a 66.00 pack-year smoking history. He has never used smokeless tobacco. He reports current alcohol use. He reports that he does not use drugs.   Family History: The patient's family history includes Allergies in his brother; Breast cancer in his mother; Cancer in his mother; Heart attack in his father; Heart disease in his brother, brother, father, and mother; Hyperlipidemia in his brother; Hypertension in his brother and mother; Other in his mother; Peripheral vascular disease in his brother; Stroke in his brother.   Review of Systems: Please see the history of present illness.  All other systems are reviewed and  negative.   Physical Exam: VS:  BP (!) 120/50   Pulse 97   Ht 5\' 9"  (1.753 m)   SpO2 97%   BMI 17.91 kg/m  .  BMI Body mass index is 17.91 kg/m.  Wt Readings from Last 3 Encounters:  09/15/19 121 lb 4.1 oz (55 kg)  09/01/19 121 lb 4.1 oz (55 kg)  08/21/19 125 lb (56.7 kg)    General: Alert and in no acute distress. He actually looks much better today than when I saw him in June. He is very hard of hearing. He is in a wheelchair.  Cardiac: Regular for the most part. He has 2+ edema on the left. R AKA.  Respiratory:  Decreased breath sounds but with normal work of breathing at rest.  GI: Soft and nontender.  MS: No deformity or atrophy. Gait not tested.  Skin: Warm and dry. Color is normal.  Neuro:  Strength and sensation are intact and no gross focal deficits noted.  Psych: Alert, appropriate and with normal affect.   LABORATORY DATA:  EKG:  EKG is not ordered today.    Lab Results  Component Value Date   WBC 7.8 09/01/2019   HGB 8.8 (L) 09/01/2019   HCT 27.9 (L) 09/01/2019   PLT 303 09/01/2019   GLUCOSE 90 09/01/2019   CHOL 146 03/26/2017   TRIG 34 03/26/2017   HDL 79 03/26/2017   LDLCALC 60 03/26/2017   ALT 39 08/22/2019   AST 29 08/22/2019   NA 132 (L) 09/01/2019   K 4.3 09/01/2019   CL 89 (L) 09/01/2019   CREATININE 1.08 09/01/2019   BUN 30 (H) 09/01/2019   CO2 34 (H) 09/01/2019   TSH 12.163 (H) 08/19/2019   INR 1.0 08/11/2019   HGBA1C (H) 02/23/2010    5.7 (NOTE)                                                                       According to the ADA Clinical Practice Recommendations for 2011, when HbA1c is used as a screening test:   >=6.5%   Diagnostic of Diabetes Mellitus           (if abnormal result  is confirmed)  5.7-6.4%   Increased risk of developing Diabetes Mellitus  References:Diagnosis and Classification of Diabetes Mellitus,Diabetes PPIR,5188,41(YSAYT 1):S62-S69 and Standards of Medical Care in         Diabetes - 2011,Diabetes Care,2011,34   (Suppl 1):S11-S61.         BNP (last 3 results) No results for input(s): BNP in the last 8760 hours.  ProBNP (last 3 results) No results for input(s): PROBNP in the last 8760 hours.   Other Studies Reviewed Today:  Lower Extremity Doppler Summary 04/2019:  Right: 75-99% stenosis noted in the common femoral artery. Total occlusion  noted in the superficial femoral artery with minimal reconstitution of the  popliteal artery.     See table(s) above for measurements and observations.    Monitor Study Highlights 12/2018    NSR  Frequent PVC's  NSVT up tp 8 secnonds  Blocked PAC's and conducted PAC's  Poor correlation between c/o dizziness, light headedness, and syncope with atthythmia.  Abnormal study with VT  Will get EP opinion. Already on beta blocker.      Belva Crome, MD  12/19/2018 3:24 PM EST     Let the patient know the Monitor shows non sustained VT up to 8 seconds. Needs Magnesium and potassium levels drawn. Spoke with EP Dr Lovena Le who does not feel EP has anything to offer (Can't use Amio or any other drug, and there is no correlation between VT and fainting or symptoms). A copy will be sent to [name], MD     ASSESSMENT & PLAN:   1. Recent R AKA - he actually looks better to me today. Seems stronger overall - actually rather amazed that he is doing this well given all of his issues. Wife seems to be managing at home.   2. Chronic lower extremity edema of the left leg - most likely multifactorial - will increase his Aldactone to 25 mg every day. Probably get home health to recheck lab in a week or so. Lab today.   3. CAD - managed medically - no chest pain noted.   4. ICM - see #2.   5. Chronic DOE/Cor pulmonary/severe COPD - on oxygen.   6. Anemia   7. Hyponatremia  8. Hypothyroid - rechecking today - may require further increase in his dose.   9. PAD -prior total occlusion of the right superficial femoral artery -  now s/p R AKA. Does report having small ulcer on the left foot - to be followed.    Current medicines are reviewed with the patient today.  The patient does not have concerns regarding medicines other than what has been noted above.  The following changes have been made:  See above.  Labs/ tests ordered today include:    Orders Placed This Encounter  Procedures  . Basic metabolic panel  . CBC  . TSH     Disposition:   FU with Dr. Tamala Julian per recall next month. Lab today. Aldactone is increased. May need increase in his Synthroid. May need to get home health to recheck lab for [place].     Patient is agreeable to this plan and will call if any problems develop in the interim.   SignedTruitt Merle, NP  09/22/2019 3:53 PM  [facility] [address] [place], [place]  [address] Phone: [phone] Fax: [phone]

## 2019-09-22 NOTE — Telephone Encounter (Signed)
Pt c/o swelling: STAT is pt has developed SOB within 24 hours  1) How much weight have you gained and in what time span? Not sure   2) If swelling, where is the swelling located? Left leg    3) Are you currently taking a fluid pill? Yes   4) Are you currently SOB? Mild (has COPD)  5) Do you have a log of your daily weights (if so, list)? No   6) Have you gained 3 pounds in a day or 5 pounds in a week? Because of his recent amputation he has lost about 10 lbs   7) Have you traveled recently? No   Patient has an appt scheduled in regards to this for today at 3:15 PM.

## 2019-09-22 NOTE — Telephone Encounter (Signed)
This will be addressed in pt's appointment today with [name], NP.

## 2019-09-22 NOTE — Patient Instructions (Addendum)
After Visit Summary:  We will be checking the following labs today - BMET, CBC, TSH   Medication Instructions:    Continue with your current medicines. BUT  I am increasing the Aldactone to a whole tablet every day.    If you need a refill on your cardiac medications before your next appointment, please call your pharmacy.     Testing/Procedures To Be Arranged:  N/A  Follow-Up:   See Dr. Tamala Julian in about a month per recall.     At [facility], you and your health needs are our priority.  As part of our continuing mission to provide you with exceptional heart care, we have created designated Provider Care Teams.  These Care Teams include your primary Cardiologist (physician) and Advanced Practice Providers (APPs -  Physician Assistants and Nurse Practitioners) who all work together to provide you with the care you need, when you need it.  Special Instructions:  . Stay safe, wash your hands for at least 20 seconds and wear a mask when needed.  . It was good to talk with you today.    Call the [place] office at [phone] if you have any questions, problems or concerns.

## 2019-09-23 ENCOUNTER — Telehealth: Payer: Self-pay | Admitting: *Deleted

## 2019-09-23 DIAGNOSIS — I110 Hypertensive heart disease with heart failure: Secondary | ICD-10-CM | POA: Diagnosis not present

## 2019-09-23 DIAGNOSIS — L89152 Pressure ulcer of sacral region, stage 2: Secondary | ICD-10-CM | POA: Diagnosis not present

## 2019-09-23 DIAGNOSIS — I739 Peripheral vascular disease, unspecified: Secondary | ICD-10-CM | POA: Diagnosis not present

## 2019-09-23 DIAGNOSIS — Z4781 Encounter for orthopedic aftercare following surgical amputation: Secondary | ICD-10-CM | POA: Diagnosis not present

## 2019-09-23 DIAGNOSIS — L89622 Pressure ulcer of left heel, stage 2: Secondary | ICD-10-CM | POA: Diagnosis not present

## 2019-09-23 DIAGNOSIS — Z89611 Acquired absence of right leg above knee: Secondary | ICD-10-CM | POA: Diagnosis not present

## 2019-09-23 LAB — BASIC METABOLIC PANEL
BUN/Creatinine Ratio: 21 (ref 10–24)
BUN: 23 mg/dL (ref 8–27)
CO2: 31 mmol/L — ABNORMAL HIGH (ref 20–29)
Calcium: 9.3 mg/dL (ref 8.6–10.2)
Chloride: 90 mmol/L — ABNORMAL LOW (ref 96–106)
Creatinine, Ser: 1.12 mg/dL (ref 0.76–1.27)
GFR calc Af Amer: 67 mL/min/{1.73_m2} (ref >59)
GFR calc non Af Amer: 58 mL/min/{1.73_m2} — ABNORMAL LOW (ref >59)
Glucose: 100 mg/dL — ABNORMAL HIGH (ref 65–99)
Potassium: 4 mmol/L (ref 3.5–5.2)
Sodium: 132 mmol/L — ABNORMAL LOW (ref 134–144)

## 2019-09-23 LAB — CBC
Hematocrit: 31.7 % — ABNORMAL LOW (ref 37.5–51.0)
Hemoglobin: 10.6 g/dL — ABNORMAL LOW (ref 13.0–17.7)
MCH: 31 pg (ref 26.6–33.0)
MCHC: 33.4 g/dL (ref 31.5–35.7)
MCV: 93 fL (ref 79–97)
Platelets: 258 10*3/uL (ref 150–450)
RBC: 3.42 x10E6/uL — ABNORMAL LOW (ref 4.14–5.80)
RDW: 12 % (ref 11.6–15.4)
WBC: 11.7 10*3/uL — ABNORMAL HIGH (ref 3.4–10.8)

## 2019-09-23 LAB — TSH: TSH: 4.92 u[IU]/mL — ABNORMAL HIGH (ref 0.450–4.500)

## 2019-09-23 NOTE — Telephone Encounter (Signed)
Patient called and states he is having increasing pain to his AKA. Patient denies any redness, swelling of cool extremity. Patient is requesting appt for evaluation. Spoke with Gwenette Greet PA ok to schedule patient on PA schedule for evaluation of his pain. Patient scheduled to be seen . Appt information given to patient.

## 2019-09-25 ENCOUNTER — Ambulatory Visit (INDEPENDENT_AMBULATORY_CARE_PROVIDER_SITE_OTHER): Payer: Self-pay | Admitting: Physician Assistant

## 2019-09-25 ENCOUNTER — Other Ambulatory Visit: Payer: Self-pay

## 2019-09-25 ENCOUNTER — Ambulatory Visit: Payer: Medicare Other

## 2019-09-25 VITALS — BP 138/68 | HR 97 | Temp 98.2°F | Resp 18 | Ht 69.0 in | Wt 125.0 lb

## 2019-09-25 DIAGNOSIS — I739 Peripheral vascular disease, unspecified: Secondary | ICD-10-CM

## 2019-09-25 DIAGNOSIS — Z4781 Encounter for orthopedic aftercare following surgical amputation: Secondary | ICD-10-CM | POA: Diagnosis not present

## 2019-09-25 DIAGNOSIS — Z89611 Acquired absence of right leg above knee: Secondary | ICD-10-CM | POA: Diagnosis not present

## 2019-09-25 DIAGNOSIS — I110 Hypertensive heart disease with heart failure: Secondary | ICD-10-CM | POA: Diagnosis not present

## 2019-09-25 DIAGNOSIS — G8918 Other acute postprocedural pain: Secondary | ICD-10-CM

## 2019-09-25 DIAGNOSIS — L89622 Pressure ulcer of left heel, stage 2: Secondary | ICD-10-CM | POA: Diagnosis not present

## 2019-09-25 DIAGNOSIS — L89152 Pressure ulcer of sacral region, stage 2: Secondary | ICD-10-CM | POA: Diagnosis not present

## 2019-09-25 NOTE — Progress Notes (Signed)
POST OPERATIVE OFFICE NOTE    CC: Right residual limb pain  HPI:  This is a 86 y.o. male who is s/p right above-the-knee amputation by Dr. Scot Dock on August 11, 2019.  This was secondary to peripheral vascular disease with tissue loss.  The patient was last seen on August 30 and his staples were removed.  His extremity was healing and he was referred to the [facility] clinic for evaluation for prosthetic limb.  He states he developed the pain approximately 3 to 4 weeks ago.  He works with physical therapy and they advised him to massage his residual limb.  He says this does help to some degree.  He has a history of lower extremity edema and has a history of a left heel ulcer.  This ulcer is dressed twice a week by home health nurses.  History of severe COPD O2 dependent. He is compliant with aspirin and statin. History of bilateral carotid stenosis estimated right ICA 1 to 39%, left ICA 1 to 39%. Allergies  Allergen Reactions  . Cefdinir Diarrhea and Other (See Comments)    Severe Diarrhea  . Nitrofurantoin Swelling and Other (See Comments)    Hand Swelling  . Sulfa Antibiotics Anaphylaxis and Swelling  . Sulfonamide Derivatives Swelling and Other (See Comments)    Facial/tongue swelling  . Tape Other (See Comments)    SKIN IS VERY THIN AND TEARS EASILY!!!!! Please do NOT use "plastic" tape- USE PAPER!!  . Ciprofloxacin Itching, Rash and Other (See Comments)    Red itchy hands  . Nitrofurantoin Swelling and Other (See Comments)    Swollen hands  . Amoxicillin Er Other (See Comments)    Frequest urination  . Doxycycline Other (See Comments)    "Felt terrible"  . Levaquin [Levofloxacin] Itching and Rash  . Sertraline Anxiety and Other (See Comments)    Makes the patient jittery    Current Outpatient Medications  Medication Sig Dispense Refill  . ALPRAZolam (XANAX) 0.25 MG tablet Take one tab by mouth bid prn anxiety 14 tablet 0  . amoxicillin (AMOXIL) 500 MG tablet Take 500 mg by  mouth 2 (two) times daily. Take one tablet twice a day for 5 days    . aspirin 81 MG tablet Take 81 mg by mouth daily.    Marland Kitchen atorvastatin (LIPITOR) 20 MG tablet TAKE 1 TABLET BY MOUTH EVERY DAY (Patient taking differently: Take 20 mg by mouth at bedtime. ) 90 tablet 2  . azithromycin (ZITHROMAX) 250 MG tablet Continue home regimen  0  . calcium carbonate (TUMS - DOSED IN MG ELEMENTAL CALCIUM) 500 MG chewable tablet Chew 1 tablet (200 mg of elemental calcium total) by mouth 2 (two) times daily as needed for indigestion or heartburn.    . Cholecalciferol (VITAMIN D3) 2000 units TABS Take 2,000 Units by mouth daily with lunch.     . diclofenac Sodium (VOLTAREN) 1 % GEL Apply 2 g topically 4 (four) times daily as needed.    . famotidine (PEPCID) 20 MG tablet Take 20 mg by mouth at bedtime.     . feeding supplement, ENSURE ENLIVE, (ENSURE ENLIVE) LIQD Take 237 mLs by mouth 3 (three) times daily between meals. 237 mL 12  . finasteride (PROSCAR) 5 MG tablet Take 5 mg by mouth daily with lunch.     . fluticasone (FLONASE) 50 MCG/ACT nasal spray Place 2 sprays into both nostrils daily.    . furosemide (LASIX) 20 MG tablet Take 1 tablet (20 mg total) by mouth  2 (two) times daily. 30 tablet   . hydrocerin (EUCERIN) CREA Apply 1 application topically 2 (two) times daily. To foot  0  . levalbuterol (XOPENEX HFA) 45 MCG/ACT inhaler Inhale 2 puffs into the lungs every 4 (four) hours as needed for wheezing or shortness of breath. 15 g 5  . levothyroxine (SYNTHROID) 25 MCG tablet Take 1 tablet (25 mcg total) by mouth daily at 6 (six) AM.    . lidocaine (LIDODERM) 5 % Apply to left heel at 8 pm and remove at 8 am daily 30 patch 0  . lidocaine (XYLOCAINE) 2 % jelly Apply 1 application topically 2 times daily at 12 noon and 4 pm. To buttocks    . melatonin 3 MG TABS tablet Take 1 tablet (3 mg total) by mouth at bedtime.  0  . metoprolol succinate (TOPROL-XL) 25 MG 24 hr tablet Take 0.5 tablets (12.5 mg total) by mouth  daily. 45 tablet 3  . Multiple Vitamins-Minerals (PRESERVISION AREDS 2) CAPS Take 1 capsule by mouth daily.    Marland Kitchen NITROSTAT 0.4 MG SL tablet Place 0.4 mg under the tongue every 5 (five) minutes as needed for chest pain.    . OXYGEN Inhale 2 L/min into the lungs continuous.     . pantoprazole (PROTONIX) 20 MG tablet Take 1 tablet (20 mg total) by mouth daily.    Vladimir Faster Glycol-Propyl Glycol (SYSTANE) 0.4-0.3 % SOLN Apply 1 drop to eye 2 (two) times daily as needed (eye irritation).  0  . polyethylene glycol (MIRALAX / GLYCOLAX) 17 g packet Take 17 g by mouth daily. 14 each 0  . potassium chloride SA (KLOR-CON) 20 MEQ tablet Take 1 tablet (20 mEq total) by mouth daily.    . predniSONE (DELTASONE) 10 MG tablet FOR FLARE OF COUGH/WHEEZING MAY INCREASE TO 2 DAILY BETTER, THEN 1 DAILY (Patient taking differently: Take 10-20 mg by mouth in the morning. Take 10 mg by mouth in the morning and may increase to 20 mg as needed for coughing/wheezing (then decrease back to 10 mg once daily)) 100 tablet 2  . silodosin (RAPAFLO) 8 MG CAPS capsule Take 8 mg by mouth daily.    . sodium chloride (MURO 128) 5 % ophthalmic solution Place 1 drop into both eyes 4 (four) times daily. 15 mL   . spironolactone (ALDACTONE) 25 MG tablet Take 1 tablet (25 mg total) by mouth daily. 12.5 mg on day and 25 mg the next day, pt alternates dosages    . SYMBICORT 160-4.5 MCG/ACT inhaler Inhale 2 puffs into the lungs 2 (two) times daily. 1 Inhaler 5  . Tiotropium Bromide Monohydrate (SPIRIVA RESPIMAT) 2.5 MCG/ACT AERS INHALE 2 PUFFS BY MOUTH EVERY DAY (Patient taking differently: Inhale 2 puffs into the lungs daily. ) 4 g 11  . vitamin B-12 (CYANOCOBALAMIN) 1000 MCG tablet Take 1,000 mcg by mouth daily.     . Zinc Oxide (TRIPLE PASTE) 12.8 % ointment Apply topically as needed for irritation. To gluteal cleft/buttocks 56.7 g 0   No current facility-administered medications for this visit.     ROS:  See HPI  Vitals:   09/25/19  1312  BP: 138/68  Pulse: 97  Resp: 18  Temp: 98.2 F (36.8 C)  SpO2: 98%   Physical Exam:  General appearance: Chronically appearing in no apparent distress Cardiac: Rate and rhythm are regular Respiratory: Loose cough, respirations are nonlabored Incision: Right AKA incision is well-healed.  There is no erythema or signs of infection. Extremities: Left lower  extremity with moderate edema.  Mild left foot erythema without increased warmth.  He has brisk DP, PT, peroneal and digital Doppler signals.  Left heel wound is approximately 1/2 to 1 cm.  Mepilex dressing removed.  Dressing at base of wound undisturbed.  There is no periwound erythema or drainage. Neuro: Alert and oriented  Assessment/Plan:  This is a 86 y.o. male who is s/p: right above-knee amputation with onset of deep tissue pain, most likely neuropathic in nature.  Recommend watchful waiting, continued massage and physical therapy.  If his pain becomes more pronounced could consider gabapentin.  Chronic left lower extremity edema with left heel ulcer.  We discussed continuing elevation, compression and wound care.  Follow-up in 6 months with left lower extremity ABIs.  Risa Grill, PA-C [facility] [phone]  Clinic MD:  Carlis Abbott on call

## 2019-09-26 ENCOUNTER — Other Ambulatory Visit: Payer: Self-pay | Admitting: *Deleted

## 2019-09-26 DIAGNOSIS — Z4781 Encounter for orthopedic aftercare following surgical amputation: Secondary | ICD-10-CM | POA: Diagnosis not present

## 2019-09-26 DIAGNOSIS — I739 Peripheral vascular disease, unspecified: Secondary | ICD-10-CM | POA: Diagnosis not present

## 2019-09-26 DIAGNOSIS — Z89611 Acquired absence of right leg above knee: Secondary | ICD-10-CM | POA: Diagnosis not present

## 2019-09-26 DIAGNOSIS — I110 Hypertensive heart disease with heart failure: Secondary | ICD-10-CM | POA: Diagnosis not present

## 2019-09-26 DIAGNOSIS — L89622 Pressure ulcer of left heel, stage 2: Secondary | ICD-10-CM | POA: Diagnosis not present

## 2019-09-26 DIAGNOSIS — L89152 Pressure ulcer of sacral region, stage 2: Secondary | ICD-10-CM | POA: Diagnosis not present

## 2019-09-29 ENCOUNTER — Other Ambulatory Visit: Payer: Medicare Other

## 2019-09-29 ENCOUNTER — Other Ambulatory Visit: Payer: Self-pay

## 2019-09-29 DIAGNOSIS — Z515 Encounter for palliative care: Secondary | ICD-10-CM

## 2019-09-30 DIAGNOSIS — L89152 Pressure ulcer of sacral region, stage 2: Secondary | ICD-10-CM | POA: Diagnosis not present

## 2019-09-30 DIAGNOSIS — L89622 Pressure ulcer of left heel, stage 2: Secondary | ICD-10-CM | POA: Diagnosis not present

## 2019-09-30 DIAGNOSIS — Z4781 Encounter for orthopedic aftercare following surgical amputation: Secondary | ICD-10-CM | POA: Diagnosis not present

## 2019-09-30 DIAGNOSIS — I739 Peripheral vascular disease, unspecified: Secondary | ICD-10-CM | POA: Diagnosis not present

## 2019-09-30 DIAGNOSIS — I110 Hypertensive heart disease with heart failure: Secondary | ICD-10-CM | POA: Diagnosis not present

## 2019-09-30 DIAGNOSIS — Z89611 Acquired absence of right leg above knee: Secondary | ICD-10-CM | POA: Diagnosis not present

## 2019-09-30 NOTE — Progress Notes (Signed)
COMMUNITY PALLIATIVE CARE SW NOTE  PATIENT NAME: [name] DOB: 1929/04/13 MRN: [id number]  PRIMARY CARE PROVIDER: [name], MD  RESPONSIBLE PARTY:  Acct ID - Guarantor Home Phone Work Phone Relationship Acct Type  [id number] - [name],[name] [phone]  Self P/F     [address], [place], [place] [address]   Due to the COVID-19, this visit was done via telephone from my office and it was initiated and consent by this patient and/or family.    PLAN OF CARE and INTERVENTIONS:             1. GOALS OF CARE/ ADVANCE CARE PLANNING:   Patient desires to remain in his home and receive care there.  2. SOCIAL/EMOTIONAL/SPIRITUAL ASSESSMENT/ INTERVENTIONS:  SW completed a telephonic visit with patient/PCG-[name]. [name] expressed that patient returned home from [facility] a few weeks ago. The family has had private paid caregivers in place that did not work out, but now his wife is providing his care alone and she expressed fatigue. SW provided additional names of agency's that could provide care for patient and encouraged her to reach out to them. She stated that they do have nursing care through [facility] which has been helpful. She advised that she feels patient needs more therapy, but she has not been successful at obtaining the additional therapy. [name] expressed that her experience with the rehab placement was not good and she questioned if palliative care would be helpful to patient. She stressed that she has not found any agency helpful to her husband or her and she didn't know what to do. SW re-educated her on the role of palliative care, the services and support that is available and how to access services. SW provided her with reassurance of support, while assessing any immediate psychosocial or nursing needs. [name] denied any needs. SW/RN to continue to provide support to patient/PCG through regular visits/telephonic support.  3. PATIENT/CAREGIVER EDUCATION/ COPING: PCG seems to be  coping adequately. Resources and support provided to assist in care needs.  4. PERSONAL EMERGENCY PLAN:  911 can be activated for emergencies.  5. COMMUNITY RESOURCES COORDINATION/ HEALTH CARE NAVIGATION:  Patient is receiving nursing services through [facility]. FINANCIAL/LEGAL CONCERNS/INTERVENTIONS:  None.    SOCIAL HX:  Social History   Tobacco Use  . Smoking status: Former Smoker    Packs/day: 2.00    Years: 33.00    Pack years: 66.00    Types: Cigarettes    Quit date: 03/25/1982    Years since quitting: 37.5  . Smokeless tobacco: Never Used  Substance Use Topics  . Alcohol use: Yes    Alcohol/week: 0.0 standard drinks    Comment: 1.5-3 oz daily in the past, minimal use in the last few months    CODE STATUS: none ADVANCED DIRECTIVES: No MOST FORM COMPLETE: No HOSPICE EDUCATION PROVIDED: No  PPS: Patient is alert and oriented x3. He requires assistance with personal care needs.  Duration of telephonic visit and documentation: 30 minutes      Katheren Puller, LCSW

## 2019-10-01 DIAGNOSIS — Z89611 Acquired absence of right leg above knee: Secondary | ICD-10-CM | POA: Diagnosis not present

## 2019-10-01 DIAGNOSIS — Z4781 Encounter for orthopedic aftercare following surgical amputation: Secondary | ICD-10-CM | POA: Diagnosis not present

## 2019-10-01 DIAGNOSIS — L89622 Pressure ulcer of left heel, stage 2: Secondary | ICD-10-CM | POA: Diagnosis not present

## 2019-10-01 DIAGNOSIS — I110 Hypertensive heart disease with heart failure: Secondary | ICD-10-CM | POA: Diagnosis not present

## 2019-10-01 DIAGNOSIS — I739 Peripheral vascular disease, unspecified: Secondary | ICD-10-CM | POA: Diagnosis not present

## 2019-10-01 DIAGNOSIS — L89152 Pressure ulcer of sacral region, stage 2: Secondary | ICD-10-CM | POA: Diagnosis not present

## 2019-10-02 DIAGNOSIS — I110 Hypertensive heart disease with heart failure: Secondary | ICD-10-CM | POA: Diagnosis not present

## 2019-10-02 DIAGNOSIS — I739 Peripheral vascular disease, unspecified: Secondary | ICD-10-CM | POA: Diagnosis not present

## 2019-10-02 DIAGNOSIS — Z4781 Encounter for orthopedic aftercare following surgical amputation: Secondary | ICD-10-CM | POA: Diagnosis not present

## 2019-10-02 DIAGNOSIS — L89622 Pressure ulcer of left heel, stage 2: Secondary | ICD-10-CM | POA: Diagnosis not present

## 2019-10-02 DIAGNOSIS — Z89611 Acquired absence of right leg above knee: Secondary | ICD-10-CM | POA: Diagnosis not present

## 2019-10-02 DIAGNOSIS — L89152 Pressure ulcer of sacral region, stage 2: Secondary | ICD-10-CM | POA: Diagnosis not present

## 2019-10-03 ENCOUNTER — Encounter: Payer: Self-pay | Admitting: Nurse Practitioner

## 2019-10-03 DIAGNOSIS — I5032 Chronic diastolic (congestive) heart failure: Secondary | ICD-10-CM | POA: Diagnosis not present

## 2019-10-03 DIAGNOSIS — I739 Peripheral vascular disease, unspecified: Secondary | ICD-10-CM | POA: Diagnosis not present

## 2019-10-03 DIAGNOSIS — Z4781 Encounter for orthopedic aftercare following surgical amputation: Secondary | ICD-10-CM | POA: Diagnosis not present

## 2019-10-03 DIAGNOSIS — I480 Paroxysmal atrial fibrillation: Secondary | ICD-10-CM | POA: Diagnosis not present

## 2019-10-03 DIAGNOSIS — I110 Hypertensive heart disease with heart failure: Secondary | ICD-10-CM | POA: Diagnosis not present

## 2019-10-03 DIAGNOSIS — Z89611 Acquired absence of right leg above knee: Secondary | ICD-10-CM | POA: Diagnosis not present

## 2019-10-03 DIAGNOSIS — L89152 Pressure ulcer of sacral region, stage 2: Secondary | ICD-10-CM | POA: Diagnosis not present

## 2019-10-03 DIAGNOSIS — L89622 Pressure ulcer of left heel, stage 2: Secondary | ICD-10-CM | POA: Diagnosis not present

## 2019-10-06 DIAGNOSIS — D649 Anemia, unspecified: Secondary | ICD-10-CM | POA: Diagnosis not present

## 2019-10-06 DIAGNOSIS — I110 Hypertensive heart disease with heart failure: Secondary | ICD-10-CM | POA: Diagnosis not present

## 2019-10-06 DIAGNOSIS — Z466 Encounter for fitting and adjustment of urinary device: Secondary | ICD-10-CM | POA: Diagnosis not present

## 2019-10-06 DIAGNOSIS — L89622 Pressure ulcer of left heel, stage 2: Secondary | ICD-10-CM | POA: Diagnosis not present

## 2019-10-06 DIAGNOSIS — I480 Paroxysmal atrial fibrillation: Secondary | ICD-10-CM | POA: Diagnosis not present

## 2019-10-06 DIAGNOSIS — N139 Obstructive and reflux uropathy, unspecified: Secondary | ICD-10-CM | POA: Diagnosis not present

## 2019-10-06 DIAGNOSIS — E871 Hypo-osmolality and hyponatremia: Secondary | ICD-10-CM | POA: Diagnosis not present

## 2019-10-06 DIAGNOSIS — I739 Peripheral vascular disease, unspecified: Secondary | ICD-10-CM | POA: Diagnosis not present

## 2019-10-06 DIAGNOSIS — L89152 Pressure ulcer of sacral region, stage 2: Secondary | ICD-10-CM | POA: Diagnosis not present

## 2019-10-06 DIAGNOSIS — Z4781 Encounter for orthopedic aftercare following surgical amputation: Secondary | ICD-10-CM | POA: Diagnosis not present

## 2019-10-06 DIAGNOSIS — R339 Retention of urine, unspecified: Secondary | ICD-10-CM | POA: Diagnosis not present

## 2019-10-06 DIAGNOSIS — J9611 Chronic respiratory failure with hypoxia: Secondary | ICD-10-CM | POA: Diagnosis not present

## 2019-10-06 DIAGNOSIS — R531 Weakness: Secondary | ICD-10-CM | POA: Diagnosis not present

## 2019-10-06 DIAGNOSIS — Z89611 Acquired absence of right leg above knee: Secondary | ICD-10-CM | POA: Diagnosis not present

## 2019-10-06 DIAGNOSIS — I2510 Atherosclerotic heart disease of native coronary artery without angina pectoris: Secondary | ICD-10-CM | POA: Diagnosis not present

## 2019-10-06 DIAGNOSIS — I87322 Chronic venous hypertension (idiopathic) with inflammation of left lower extremity: Secondary | ICD-10-CM | POA: Diagnosis not present

## 2019-10-06 DIAGNOSIS — Z9981 Dependence on supplemental oxygen: Secondary | ICD-10-CM | POA: Diagnosis not present

## 2019-10-06 DIAGNOSIS — R262 Difficulty in walking, not elsewhere classified: Secondary | ICD-10-CM | POA: Diagnosis not present

## 2019-10-06 DIAGNOSIS — I5032 Chronic diastolic (congestive) heart failure: Secondary | ICD-10-CM | POA: Diagnosis not present

## 2019-10-07 ENCOUNTER — Telehealth: Payer: Self-pay | Admitting: *Deleted

## 2019-10-07 DIAGNOSIS — Z4781 Encounter for orthopedic aftercare following surgical amputation: Secondary | ICD-10-CM | POA: Diagnosis not present

## 2019-10-07 DIAGNOSIS — I739 Peripheral vascular disease, unspecified: Secondary | ICD-10-CM | POA: Diagnosis not present

## 2019-10-07 DIAGNOSIS — Z89611 Acquired absence of right leg above knee: Secondary | ICD-10-CM | POA: Diagnosis not present

## 2019-10-07 DIAGNOSIS — L89622 Pressure ulcer of left heel, stage 2: Secondary | ICD-10-CM | POA: Diagnosis not present

## 2019-10-07 DIAGNOSIS — L89152 Pressure ulcer of sacral region, stage 2: Secondary | ICD-10-CM | POA: Diagnosis not present

## 2019-10-07 DIAGNOSIS — I110 Hypertensive heart disease with heart failure: Secondary | ICD-10-CM | POA: Diagnosis not present

## 2019-10-07 NOTE — Telephone Encounter (Signed)
S/w pt with lab results from [facility].  Everything was stable with pt's labs. Pt and spouse is aware.

## 2019-10-07 NOTE — Telephone Encounter (Signed)
S/w Rollen Sox at [facility] @ [phone]  Fax # [phone]. Faxed sign order for pt's bmet last week.  [place] faxed results of lab.

## 2019-10-08 DIAGNOSIS — Z4781 Encounter for orthopedic aftercare following surgical amputation: Secondary | ICD-10-CM | POA: Diagnosis not present

## 2019-10-08 DIAGNOSIS — I110 Hypertensive heart disease with heart failure: Secondary | ICD-10-CM | POA: Diagnosis not present

## 2019-10-08 DIAGNOSIS — I739 Peripheral vascular disease, unspecified: Secondary | ICD-10-CM | POA: Diagnosis not present

## 2019-10-08 DIAGNOSIS — L89622 Pressure ulcer of left heel, stage 2: Secondary | ICD-10-CM | POA: Diagnosis not present

## 2019-10-08 DIAGNOSIS — L89152 Pressure ulcer of sacral region, stage 2: Secondary | ICD-10-CM | POA: Diagnosis not present

## 2019-10-08 DIAGNOSIS — Z89611 Acquired absence of right leg above knee: Secondary | ICD-10-CM | POA: Diagnosis not present

## 2019-10-09 ENCOUNTER — Inpatient Hospital Stay: Payer: Medicare Other | Admitting: Physical Medicine & Rehabilitation

## 2019-10-09 DIAGNOSIS — L89152 Pressure ulcer of sacral region, stage 2: Secondary | ICD-10-CM | POA: Diagnosis not present

## 2019-10-09 DIAGNOSIS — I110 Hypertensive heart disease with heart failure: Secondary | ICD-10-CM | POA: Diagnosis not present

## 2019-10-09 DIAGNOSIS — L89622 Pressure ulcer of left heel, stage 2: Secondary | ICD-10-CM | POA: Diagnosis not present

## 2019-10-09 DIAGNOSIS — Z89611 Acquired absence of right leg above knee: Secondary | ICD-10-CM | POA: Diagnosis not present

## 2019-10-09 DIAGNOSIS — I739 Peripheral vascular disease, unspecified: Secondary | ICD-10-CM | POA: Diagnosis not present

## 2019-10-09 DIAGNOSIS — Z4781 Encounter for orthopedic aftercare following surgical amputation: Secondary | ICD-10-CM | POA: Diagnosis not present

## 2019-10-10 ENCOUNTER — Other Ambulatory Visit: Payer: Self-pay | Admitting: Internal Medicine

## 2019-10-10 DIAGNOSIS — R3914 Feeling of incomplete bladder emptying: Secondary | ICD-10-CM | POA: Diagnosis not present

## 2019-10-11 DIAGNOSIS — I110 Hypertensive heart disease with heart failure: Secondary | ICD-10-CM | POA: Diagnosis not present

## 2019-10-11 DIAGNOSIS — I739 Peripheral vascular disease, unspecified: Secondary | ICD-10-CM | POA: Diagnosis not present

## 2019-10-11 DIAGNOSIS — L89622 Pressure ulcer of left heel, stage 2: Secondary | ICD-10-CM | POA: Diagnosis not present

## 2019-10-11 DIAGNOSIS — Z89611 Acquired absence of right leg above knee: Secondary | ICD-10-CM | POA: Diagnosis not present

## 2019-10-11 DIAGNOSIS — L89152 Pressure ulcer of sacral region, stage 2: Secondary | ICD-10-CM | POA: Diagnosis not present

## 2019-10-11 DIAGNOSIS — Z4781 Encounter for orthopedic aftercare following surgical amputation: Secondary | ICD-10-CM | POA: Diagnosis not present

## 2019-10-13 ENCOUNTER — Telehealth: Payer: Self-pay | Admitting: Internal Medicine

## 2019-10-13 DIAGNOSIS — L89622 Pressure ulcer of left heel, stage 2: Secondary | ICD-10-CM | POA: Diagnosis not present

## 2019-10-13 DIAGNOSIS — I110 Hypertensive heart disease with heart failure: Secondary | ICD-10-CM | POA: Diagnosis not present

## 2019-10-13 DIAGNOSIS — I739 Peripheral vascular disease, unspecified: Secondary | ICD-10-CM | POA: Diagnosis not present

## 2019-10-13 DIAGNOSIS — Z4781 Encounter for orthopedic aftercare following surgical amputation: Secondary | ICD-10-CM | POA: Diagnosis not present

## 2019-10-13 DIAGNOSIS — Z89611 Acquired absence of right leg above knee: Secondary | ICD-10-CM | POA: Diagnosis not present

## 2019-10-13 DIAGNOSIS — L89152 Pressure ulcer of sacral region, stage 2: Secondary | ICD-10-CM | POA: Diagnosis not present

## 2019-10-13 NOTE — Telephone Encounter (Signed)
It looks like both of these meds were sent in 10/13/19  Called pt to see what the issue is and had to LMTCB x 1

## 2019-10-14 ENCOUNTER — Telehealth: Payer: Self-pay | Admitting: Internal Medicine

## 2019-10-14 ENCOUNTER — Telehealth: Payer: Self-pay | Admitting: Interventional Cardiology

## 2019-10-14 DIAGNOSIS — L89152 Pressure ulcer of sacral region, stage 2: Secondary | ICD-10-CM | POA: Diagnosis not present

## 2019-10-14 DIAGNOSIS — Z4781 Encounter for orthopedic aftercare following surgical amputation: Secondary | ICD-10-CM | POA: Diagnosis not present

## 2019-10-14 DIAGNOSIS — I110 Hypertensive heart disease with heart failure: Secondary | ICD-10-CM | POA: Diagnosis not present

## 2019-10-14 DIAGNOSIS — L89622 Pressure ulcer of left heel, stage 2: Secondary | ICD-10-CM | POA: Diagnosis not present

## 2019-10-14 DIAGNOSIS — I739 Peripheral vascular disease, unspecified: Secondary | ICD-10-CM | POA: Diagnosis not present

## 2019-10-14 DIAGNOSIS — Z89611 Acquired absence of right leg above knee: Secondary | ICD-10-CM | POA: Diagnosis not present

## 2019-10-14 NOTE — Telephone Encounter (Signed)
Pt c/o swelling: STAT is pt has developed SOB within 24 hours  1) How much weight have you gained and in what time span? No noticeable weight gain   2) If swelling, where is the swelling located? Left leg and foot (aka on right)   3) Are you currently taking a fluid pill? Yes   4) Are you currently SOB? Yes, on exertion   5) Do you have a log of your daily weights (if so, list)? No   6) Have you gained 3 pounds in a day or 5 pounds in a week? No   7) Have you traveled recently? No

## 2019-10-14 NOTE — Telephone Encounter (Signed)
Spoke with pt's wife and she states swelling in left leg and foot is worse than usual.  Denies increased salt in diet.  Swelling improves with elevation but returns once he gets up.  Does wear a compression stocking.  BPs 110-120/60s, HR usually around 85.  Wife states pt has really declined over the last 10 days.  Not as alert as usual and unable to function as much as he usually does.  Was doing great with PT but not so much now.  Has issues with sinusitis as well that causes HA and vision problems.  Pt had labs 9/13 and then they were repeated a couple of weeks later through [facility] but no copy in system at this time.  There is a phone note stating they were stable.  Advised wife to go ahead and give pt another Furosemide 20mg  now and I will send message to Dr. Tamala Julian for review and advisement.  Pt scheduled to see Dr. Tamala Julian at the end of October.

## 2019-10-14 NOTE — Telephone Encounter (Signed)
Noted  

## 2019-10-14 NOTE — Telephone Encounter (Signed)
Needs to reconsider Palliative care and Hospice.

## 2019-10-15 DIAGNOSIS — R5383 Other fatigue: Secondary | ICD-10-CM | POA: Diagnosis not present

## 2019-10-15 DIAGNOSIS — J329 Chronic sinusitis, unspecified: Secondary | ICD-10-CM | POA: Diagnosis not present

## 2019-10-15 NOTE — Telephone Encounter (Signed)
[name] returning call.

## 2019-10-15 NOTE — Telephone Encounter (Signed)
Left message to call back  

## 2019-10-15 NOTE — Telephone Encounter (Signed)
Spoke with wife and she states pt is not doing well at all.  He is even more lethargic now and not responding.  BP is 113/73.  Advised wife that Dr. Tamala Julian said they need to reconsider Hospice/Palliative Care. Advised with symptom changes to either consider Hospice/Palliative Care or go to ER ASAP.  Wife is going to contact PCP to discuss.  Wife appreciative for call.

## 2019-10-16 DIAGNOSIS — L89152 Pressure ulcer of sacral region, stage 2: Secondary | ICD-10-CM | POA: Diagnosis not present

## 2019-10-16 DIAGNOSIS — Z89611 Acquired absence of right leg above knee: Secondary | ICD-10-CM | POA: Diagnosis not present

## 2019-10-16 DIAGNOSIS — Z4781 Encounter for orthopedic aftercare following surgical amputation: Secondary | ICD-10-CM | POA: Diagnosis not present

## 2019-10-16 DIAGNOSIS — I739 Peripheral vascular disease, unspecified: Secondary | ICD-10-CM | POA: Diagnosis not present

## 2019-10-16 DIAGNOSIS — I110 Hypertensive heart disease with heart failure: Secondary | ICD-10-CM | POA: Diagnosis not present

## 2019-10-16 DIAGNOSIS — L89622 Pressure ulcer of left heel, stage 2: Secondary | ICD-10-CM | POA: Diagnosis not present

## 2019-10-16 NOTE — Telephone Encounter (Signed)
Called and spoke to pt and pt's wife. They stated the pt received his medication, no issues. Pt would like to thank Magda Paganini for all the help she has given. Will forward to [facility] has FYI.   Nothing further needed at this time. Will sign off.

## 2019-10-17 ENCOUNTER — Telehealth: Payer: Self-pay | Admitting: Interventional Cardiology

## 2019-10-17 ENCOUNTER — Other Ambulatory Visit: Payer: Self-pay

## 2019-10-17 NOTE — Telephone Encounter (Signed)
Pt c/o swelling: STAT is pt has developed SOB within 24 hours  1) How much weight have you gained and in what time span? Hollace Kinnier with [facility] states that she does not see him often. She states he stays between 126-128  2) If swelling, where is the swelling located? Left lower leg and foot  3) Are you currently taking a fluid pill? Yes, patient is following regular regimen of medication.   4) Are you currently SOB?  Denied any SOB  5) Do you have a log of your daily weights (if so, list)? n/a  6) Have you gained 3 pounds in a day or 5 pounds in a week? no  7) Have you traveled recently? no

## 2019-10-17 NOTE — Telephone Encounter (Signed)
Follow Up:     [name] is returning your call.

## 2019-10-17 NOTE — Telephone Encounter (Signed)
[facility] called in a few days ago and then again today about swelling.  Spoke with Dr. Tamala Julian at time of the last call and again this morning.  Pt really needs to consider Hospice and Palliative Care through PCP.  Unfortunately, nothing else we can do from a cardiac standpoint.  Called Erica and left message to call back.

## 2019-10-17 NOTE — Telephone Encounter (Signed)
Spoke with Danae Chen and made her aware of recommendations from Dr. Tamala Julian.  Erica verbalized understanding.

## 2019-10-18 DIAGNOSIS — Z89611 Acquired absence of right leg above knee: Secondary | ICD-10-CM | POA: Diagnosis not present

## 2019-10-18 DIAGNOSIS — L89152 Pressure ulcer of sacral region, stage 2: Secondary | ICD-10-CM | POA: Diagnosis not present

## 2019-10-18 DIAGNOSIS — I739 Peripheral vascular disease, unspecified: Secondary | ICD-10-CM | POA: Diagnosis not present

## 2019-10-18 DIAGNOSIS — L89622 Pressure ulcer of left heel, stage 2: Secondary | ICD-10-CM | POA: Diagnosis not present

## 2019-10-18 DIAGNOSIS — I110 Hypertensive heart disease with heart failure: Secondary | ICD-10-CM | POA: Diagnosis not present

## 2019-10-18 DIAGNOSIS — Z4781 Encounter for orthopedic aftercare following surgical amputation: Secondary | ICD-10-CM | POA: Diagnosis not present

## 2019-10-19 DIAGNOSIS — L89152 Pressure ulcer of sacral region, stage 2: Secondary | ICD-10-CM | POA: Diagnosis not present

## 2019-10-19 DIAGNOSIS — I739 Peripheral vascular disease, unspecified: Secondary | ICD-10-CM | POA: Diagnosis not present

## 2019-10-19 DIAGNOSIS — Z89611 Acquired absence of right leg above knee: Secondary | ICD-10-CM | POA: Diagnosis not present

## 2019-10-19 DIAGNOSIS — Z4781 Encounter for orthopedic aftercare following surgical amputation: Secondary | ICD-10-CM | POA: Diagnosis not present

## 2019-10-19 DIAGNOSIS — I110 Hypertensive heart disease with heart failure: Secondary | ICD-10-CM | POA: Diagnosis not present

## 2019-10-19 DIAGNOSIS — L89622 Pressure ulcer of left heel, stage 2: Secondary | ICD-10-CM | POA: Diagnosis not present

## 2019-10-20 ENCOUNTER — Encounter: Payer: Self-pay | Admitting: Physical Medicine & Rehabilitation

## 2019-10-20 ENCOUNTER — Other Ambulatory Visit: Payer: Self-pay

## 2019-10-20 ENCOUNTER — Encounter: Payer: Medicare Other | Attending: Physical Medicine & Rehabilitation | Admitting: Physical Medicine & Rehabilitation

## 2019-10-20 VITALS — BP 116/64 | HR 97 | Ht 68.0 in

## 2019-10-20 DIAGNOSIS — Z4781 Encounter for orthopedic aftercare following surgical amputation: Secondary | ICD-10-CM | POA: Diagnosis not present

## 2019-10-20 DIAGNOSIS — I779 Disorder of arteries and arterioles, unspecified: Secondary | ICD-10-CM | POA: Diagnosis not present

## 2019-10-20 DIAGNOSIS — R39198 Other difficulties with micturition: Secondary | ICD-10-CM | POA: Diagnosis not present

## 2019-10-20 DIAGNOSIS — I5032 Chronic diastolic (congestive) heart failure: Secondary | ICD-10-CM | POA: Diagnosis not present

## 2019-10-20 DIAGNOSIS — I2510 Atherosclerotic heart disease of native coronary artery without angina pectoris: Secondary | ICD-10-CM | POA: Diagnosis not present

## 2019-10-20 DIAGNOSIS — Z89611 Acquired absence of right leg above knee: Secondary | ICD-10-CM

## 2019-10-20 DIAGNOSIS — J449 Chronic obstructive pulmonary disease, unspecified: Secondary | ICD-10-CM | POA: Diagnosis not present

## 2019-10-20 DIAGNOSIS — Z9981 Dependence on supplemental oxygen: Secondary | ICD-10-CM

## 2019-10-20 DIAGNOSIS — I5022 Chronic systolic (congestive) heart failure: Secondary | ICD-10-CM | POA: Diagnosis not present

## 2019-10-20 DIAGNOSIS — R269 Unspecified abnormalities of gait and mobility: Secondary | ICD-10-CM | POA: Diagnosis not present

## 2019-10-20 DIAGNOSIS — I10 Essential (primary) hypertension: Secondary | ICD-10-CM | POA: Diagnosis not present

## 2019-10-20 DIAGNOSIS — I509 Heart failure, unspecified: Secondary | ICD-10-CM | POA: Diagnosis not present

## 2019-10-20 DIAGNOSIS — I110 Hypertensive heart disease with heart failure: Secondary | ICD-10-CM | POA: Diagnosis not present

## 2019-10-20 DIAGNOSIS — L89622 Pressure ulcer of left heel, stage 2: Secondary | ICD-10-CM | POA: Diagnosis not present

## 2019-10-20 DIAGNOSIS — N400 Enlarged prostate without lower urinary tract symptoms: Secondary | ICD-10-CM | POA: Diagnosis not present

## 2019-10-20 DIAGNOSIS — G8918 Other acute postprocedural pain: Secondary | ICD-10-CM | POA: Diagnosis not present

## 2019-10-20 DIAGNOSIS — E782 Mixed hyperlipidemia: Secondary | ICD-10-CM | POA: Diagnosis not present

## 2019-10-20 DIAGNOSIS — E039 Hypothyroidism, unspecified: Secondary | ICD-10-CM | POA: Diagnosis not present

## 2019-10-20 DIAGNOSIS — L89152 Pressure ulcer of sacral region, stage 2: Secondary | ICD-10-CM | POA: Diagnosis not present

## 2019-10-20 DIAGNOSIS — I739 Peripheral vascular disease, unspecified: Secondary | ICD-10-CM | POA: Diagnosis not present

## 2019-10-20 DIAGNOSIS — D509 Iron deficiency anemia, unspecified: Secondary | ICD-10-CM | POA: Diagnosis not present

## 2019-10-20 DIAGNOSIS — I25810 Atherosclerosis of coronary artery bypass graft(s) without angina pectoris: Secondary | ICD-10-CM | POA: Diagnosis not present

## 2019-10-20 DIAGNOSIS — F324 Major depressive disorder, single episode, in partial remission: Secondary | ICD-10-CM | POA: Diagnosis not present

## 2019-10-20 MED ORDER — GABAPENTIN 100 MG PO CAPS: 100.0000 mg | ORAL_CAPSULE | Freq: Two times a day (BID) | ORAL | 1 refills | Status: DC

## 2019-10-20 NOTE — Progress Notes (Signed)
Subjective:    Patient ID: [name], male    DOB: Dec 23, 1929, 86 y.o.   MRN: [id number]  HPI Male with history of COPD with chronic hypoxic respiratory failure and oxygen dependent, BPH--Foley in place for retention, PAF, CAD, PVD presents for follow up for right AKA with multiple medical problems.  Wife provides history. At discharge, He was discharged to SNF.  Since that time, he saw Vascular and staples were removed. He is now at home. He has not been checking his weights. Left heel is healing.  Coccyx is stable, he is scheduled to see wound care. He is taking Xanax .25 TID. Continues to require COPD.  PCP followed up with lab work. BP is controlled. Continues to be unable to void. Denies falls.  Therapies: 2/week DME: Bedside commode Mobility: Wheelchair at all times  Pain Inventory Average Pain 5 Pain Right Now 5 My pain is constant, sharp, burning, dull, stabbing, tingling and aching  In the last 24 hours, has pain interfered with the following? General activity 4 Relation with others 4 Enjoyment of life 6 What TIME of day is your pain at its worst? morning  Sleep (in general) Poor  Pain is worse with: sitting and some activites Pain improves with: medication and Tylenol Relief from Meds: 4  use a wheelchair transfers alone Do you have any goals in this area?  yes  not employed: date last employed 1989 I need assistance with the following:  dressing, bathing, meal prep, household duties and shopping Do you have any goals in this area?  yes  bladder control problems weakness numbness trouble walking depression anxiety  New Patient  New Patient    Family History  Problem Relation Age of Onset  . Heart disease Mother   . Heart disease Father        Before age 36  . Breast cancer Mother   . Cancer Mother        Breast cancer  . Hypertension Mother   . Other Mother        AAA  and   Amputation  . Heart attack Father   . Stroke Brother        x3,  still living   . Peripheral vascular disease Brother   . Heart disease Brother   . Hyperlipidemia Brother   . Hypertension Brother   . Heart disease Brother   . Allergies Brother    Social History   Socioeconomic History  . Marital status: Married    Spouse name: Not on file  . Number of children: 2  . Years of education: Not on file  . Highest education level: Not on file  Occupational History  . 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 Smoking status: Former Smoker    Packs/day: 2.00    Years: 33.00    Pack years: 66.00    Types: Cigarettes    Quit date: 03/25/1982    Years since quitting: 37.5  . Smokeless tobacco: Never Used  Vaping Use  . Vaping Use: Never used  Substance and Sexual Activity  . Alcohol use: Yes    Alcohol/week: 0.0 standard drinks    Comment: 1.5-3 oz daily in the past, minimal use in the last few months  . Drug use: No  . Sexual activity: Not on file  Other Topics Concern  . Not on file  Social History Narrative   ** Merged History Encounter **  Pt lives at home with his spouse.         Social Determinants of Health   Financial Resource Strain:   . Difficulty of Paying Living Expenses: Not on file  Food Insecurity: No Food Insecurity  . Worried About Charity fundraiser in the Last Year: Never true  . Ran Out of Food in the Last Year: Never true  Transportation Needs: No Transportation Needs  . Lack of Transportation (Medical): No  . Lack of Transportation (Non-Medical): No  Physical Activity:   . Days of Exercise per Week: Not on file  . Minutes of Exercise per Session: Not on file  Stress:   . Feeling of Stress : Not on file  Social Connections:   . Frequency of Communication with Friends and Family: Not on file  . Frequency of Social Gatherings with Friends and Family: Not on file  . Attends Religious Services: Not on file  . Active Member of Clubs or Organizations: Not on file    . Attends Archivist Meetings: Not on file  . Marital Status: Not on file   Past Surgical History:  Procedure Laterality Date  . ABDOMINAL AORTOGRAM W/LOWER EXTREMITY Bilateral 06/30/2019   Procedure: ABDOMINAL AORTOGRAM W/LOWER EXTREMITY;  Surgeon: Waynetta Sandy, MD;  Location: [facility];  Service: Cardiovascular;  Laterality: Bilateral;  . AMPUTATION Right 08/11/2019   Procedure: RIGHT ABOVE KNEE AMPUTATION;  Surgeon: Angelia Mould, MD;  Location: [facility];  Service: Vascular;  Laterality: Right;  . BASAL CELL CARCINOMA EXCISION Left    ear  . CARDIAC CATHETERIZATION     "prior to bypass"  . CATARACT EXTRACTION W/ INTRAOCULAR LENS  IMPLANT, BILATERAL Bilateral   . CORONARY ARTERY BYPASS GRAFT  10/2001   LIMA to LAD, SVG to OM1-2, SVG to RCA and PDA  . LITHOTRIPSY    . PROSTATE BIOPSY  Oct. 2014  . TONSILLECTOMY     Past Medical History:  Diagnosis Date  . Anxiety   . Arthritis    "generalized" (04/04/2017)  . Asthma   . Basal cell carcinoma    "left ear"  . BPH (benign prostatic hyperplasia)    severe; s/p multiple biopsies  . CAD (coronary artery disease)   . Carotid artery occlusion   . Chronic respiratory failure ([place])   . Chronic rhinitis   . COPD (chronic obstructive pulmonary disease) (H[facility])    2L [place] O2  . Diastolic heart failure ([place]) 2019  . Dilation of biliary tract   . Elevated troponin 03/09/2017  . Gallstones   . GERD (gastroesophageal reflux disease)   . History of blood transfusion    "w/his CABG" (04/04/2017)  . History of kidney stones   . Hyperlipidemia   . Hypertension   . On home oxygen therapy    "~ 24/7" (04/04/2017)  . Peripheral vascular disease ([place])   . Pneumonia 2019  . Squamous cell carcinoma of skin 04/23/2013   in situ-Right flank (txpbx)  . Squamous cell carcinoma of skin 01/23/2017   [place]/ in siut- Left ear rim (CX3FU)  . Syncope and collapse   . Ureteral tumor 08/2015   had endoscopic procedure  for evaluation, unable to reach for biopsy  . Urinary catheter (Foley) change required    BP 116/64   Pulse 97   Ht 5\' 8"  (1.727 m)   SpO2 92% Comment: 2 liters of O2  BMI 19.01 kg/m   Opioid Risk Score:   Fall Risk Score:  `  1  Depression screen PHQ 2/9  Depression screen PHQ 2/9 10/20/2019  Decreased Interest 2  Down, Depressed, Hopeless 2  PHQ - 2 Score 4  Altered sleeping 3  Tired, decreased energy 3  Change in appetite 0  Feeling bad or failure about yourself  1  Trouble concentrating 1  Moving slowly or fidgety/restless 2  Suicidal thoughts 0  PHQ-9 Score 14  Some recent data might be hidden   Review of Systems  Constitutional: Negative.   HENT: Positive for sinus pain.        Ear blockage   Eyes: Negative.   Respiratory: Positive for shortness of breath.        Copd  Cardiovascular: Positive for leg swelling.  Endocrine: Negative.   Genitourinary: Positive for difficulty urinating.       Foley cath. Urine retention  Musculoskeletal: Positive for gait problem and neck pain.       Stump pain Left leg numbness Right foot pain  Neurological: Positive for dizziness and weakness.       Vertigo  Hematological: Bruises/bleeds easily.  All other systems reviewed and are negative.     Objective:   Physical Exam  Constitutional: No distress . Vital signs reviewed. Frail. HENT: Normocephalic.  Atraumatic. Eyes: EOMI. No discharge. Cardiovascular: No JVD.   Respiratory: Normal effort.  No stridor.  +[place]. GI: Non-distended.   Skin: Right AKA Coccyx not examined today Psych: Normal mood.  Normal behavior. Musc: Right AKA with mild tenderness LLE with edema Neuro: Alert Extreme HOH Motor: Bilateral upper extremities: 4+/5 proximal distally Left lower extremity: 4/5 proximal distal Right lower extremity: Hip flexion, knee extension 4+/5 (some pain patient)    Assessment & Plan:  Male with history of COPD with chronic hypoxic respiratory failure and oxygen  dependent, BPH--Foley in place for retention, PAF, CAD, PVD presents for follow up for right AKA with multiple medical problems.  1.  Impaired mobility and ADLs secondary to right AKA with significant medical comorbidities             Continue therapies  Released by Vascular   2. Phantom limb therapies  Will order  Gabapentin 100 BID  3. Mood:   Using Xanax 0.5 TID  4.COPD/chronic hypoxic respiratory failure:   Continue supplemental oxygen  5. BPH/renal calculi:              Continue foley, unable to wean              Continue finasteride.   6. Hyponatremia:   Being followed up by PCP.  7. HTN:   Controlled today  Continue meds  8. CAD/Chronic systolic CHF:   Cont meds  9. Orthostatics  Educated on fluid intake and slow positional changes  10. Gait abnormality  Continue wheelchair for safety.  Cont therapies.   Meds reviewed Referral reivewed All questions ansered

## 2019-10-21 ENCOUNTER — Telehealth: Payer: Self-pay | Admitting: Internal Medicine

## 2019-10-21 NOTE — Telephone Encounter (Signed)
ATC Patient to schedule flu vaccine for next week. Patient Wife stated they would have to call back to schedule after checking there calender.

## 2019-10-22 DIAGNOSIS — Z89611 Acquired absence of right leg above knee: Secondary | ICD-10-CM | POA: Diagnosis not present

## 2019-10-22 DIAGNOSIS — Z4781 Encounter for orthopedic aftercare following surgical amputation: Secondary | ICD-10-CM | POA: Diagnosis not present

## 2019-10-22 DIAGNOSIS — L89152 Pressure ulcer of sacral region, stage 2: Secondary | ICD-10-CM | POA: Diagnosis not present

## 2019-10-22 DIAGNOSIS — L89622 Pressure ulcer of left heel, stage 2: Secondary | ICD-10-CM | POA: Diagnosis not present

## 2019-10-22 DIAGNOSIS — I739 Peripheral vascular disease, unspecified: Secondary | ICD-10-CM | POA: Diagnosis not present

## 2019-10-22 DIAGNOSIS — I110 Hypertensive heart disease with heart failure: Secondary | ICD-10-CM | POA: Diagnosis not present

## 2019-10-23 ENCOUNTER — Telehealth: Payer: Self-pay

## 2019-10-23 ENCOUNTER — Other Ambulatory Visit: Payer: Self-pay | Admitting: *Deleted

## 2019-10-23 NOTE — Patient Outreach (Signed)
[facility]) Care Management  10/23/2019  [name] 03/13/1929 [id number]   Noted that member is back in the home, no longer in SNF for rehab.  Unclear when member was discharged.  Call placed to member's wife, she report they had a very bad experience at [facility] and decided to take member home.  State they have been getting home visits from [facility] for home health and PT.  She is hoping that member will progress more to be able to use prosthetic, participate in more rigorous therapy, and become more independent.  He is still mostly dependent with ADL's at this time.  Was seen by rehab physician on 10/11, follow up on 11/11.  He also has visits scheduled with pulmonary on 10/25, cardiovascular on 10/29, and wound center on 11/2.    Wife acknowledged recommendations for hospice, report he is already active with palliative care but state she has not heard from them in a while.  Call was placed to [facility] to confirm member is still active, spoke with [place], [place].  She report telephone encounter within the last month, conference was held with member, wife, and and son.  Need for hospice discussed at that time but patient and family refused.  They will continue monthly follow up.  Wife report they are willing to consider if member does not progress as expected and/or takes a turn for the worst.    Will follow up with member/wife within the next month.  Goals Addressed            This Visit's Progress   . COMPLETED: Decrease inpatient admissions/ readmissions       CARE PLAN ENTRY (see longitudinal plan of care for additional care plan information)  Current Barriers:  Marland Kitchen Knowledge Deficits related to severity of disease  Nurse Case Manager Clinical Goal(s):  Marland Kitchen Over the next 28 days, patient will verbalize understanding of plan for treatment of non-healing wounds  . Over the next 31 days, patient will not experience hospital admission. Hospital Admissions in last 6  months = 1 . Over the next 28 days, patient will attend all scheduled medical appointments: Urology, PCP, Vascular, Wound center, and Pulmonology  Interventions:  . Inter-disciplinary care team collaboration (see longitudinal plan of care) . Evaluation of current treatment plan related to wound treatment and patient's adherence to plan as established by provider. . Reviewed medications with patient and discussed importance of taking diuretic to decrease swelling . Collaborated with [facility] regarding initial home visit and assessment . Discussed plans with patient for ongoing care management follow up and provided patient with direct contact information for care management team  Patient Self Care Activities:  . Self administers medications as prescribed . Attends all scheduled provider appointments . Unable to perform ADLs independently  Initial goal documentation     . [facility] - Learn More About My Health       Follow Up Date 12/14   - make a list of questions - ask questions - speak up when I don't understand    Why is this important?   The best way to learn about your health and care is by talking to the doctor and nurse.  They will answer your questions and give you information in the way that you like best.    Notes:  Will report understanding of progression and recovery from amputation     . [facility] - Make and Keep All Appointments       Follow  Up Date 11/14   - call to cancel if needed - keep a calendar with appointment dates    Why is this important?   Part of staying healthy is seeing the doctor for follow-up care.  If you forget your appointments, there are some things you can do to stay on track.    Notes:        Valente Tyjae, RN, MSN [place] [phone]

## 2019-10-23 NOTE — Telephone Encounter (Signed)
(  3:00pm) SW returned call to [facility] [place] with [facility] [place]. SW provided update on team status//contact with patient. No current changes to patient's plan of care.

## 2019-10-24 ENCOUNTER — Telehealth: Payer: Self-pay | Admitting: *Deleted

## 2019-10-24 DIAGNOSIS — Z4781 Encounter for orthopedic aftercare following surgical amputation: Secondary | ICD-10-CM | POA: Diagnosis not present

## 2019-10-24 DIAGNOSIS — Z89611 Acquired absence of right leg above knee: Secondary | ICD-10-CM | POA: Diagnosis not present

## 2019-10-24 DIAGNOSIS — I739 Peripheral vascular disease, unspecified: Secondary | ICD-10-CM | POA: Diagnosis not present

## 2019-10-24 DIAGNOSIS — L89622 Pressure ulcer of left heel, stage 2: Secondary | ICD-10-CM | POA: Diagnosis not present

## 2019-10-24 DIAGNOSIS — L89152 Pressure ulcer of sacral region, stage 2: Secondary | ICD-10-CM | POA: Diagnosis not present

## 2019-10-24 DIAGNOSIS — I110 Hypertensive heart disease with heart failure: Secondary | ICD-10-CM | POA: Diagnosis not present

## 2019-10-24 NOTE — Telephone Encounter (Signed)
Called and spoke with patient's wife, [name], to schedule a Palliative care home visit. Visit scheduled for 10/19@1 :30p.

## 2019-10-27 DIAGNOSIS — L89152 Pressure ulcer of sacral region, stage 2: Secondary | ICD-10-CM | POA: Diagnosis not present

## 2019-10-27 DIAGNOSIS — R531 Weakness: Secondary | ICD-10-CM | POA: Diagnosis not present

## 2019-10-27 DIAGNOSIS — L89622 Pressure ulcer of left heel, stage 2: Secondary | ICD-10-CM | POA: Diagnosis not present

## 2019-10-27 DIAGNOSIS — E039 Hypothyroidism, unspecified: Secondary | ICD-10-CM | POA: Diagnosis not present

## 2019-10-27 DIAGNOSIS — Z4781 Encounter for orthopedic aftercare following surgical amputation: Secondary | ICD-10-CM | POA: Diagnosis not present

## 2019-10-27 DIAGNOSIS — J019 Acute sinusitis, unspecified: Secondary | ICD-10-CM | POA: Diagnosis not present

## 2019-10-27 DIAGNOSIS — I110 Hypertensive heart disease with heart failure: Secondary | ICD-10-CM | POA: Diagnosis not present

## 2019-10-27 DIAGNOSIS — Z89611 Acquired absence of right leg above knee: Secondary | ICD-10-CM | POA: Diagnosis not present

## 2019-10-27 DIAGNOSIS — Z23 Encounter for immunization: Secondary | ICD-10-CM | POA: Diagnosis not present

## 2019-10-27 DIAGNOSIS — I739 Peripheral vascular disease, unspecified: Secondary | ICD-10-CM | POA: Diagnosis not present

## 2019-10-27 NOTE — Telephone Encounter (Signed)
Pt is scheduled with Dr. [name] on 10/28 - still needing to make flu shot appt [phone]

## 2019-10-28 ENCOUNTER — Other Ambulatory Visit: Payer: Medicare Other | Admitting: *Deleted

## 2019-10-28 ENCOUNTER — Other Ambulatory Visit: Payer: Self-pay

## 2019-10-28 VITALS — BP 110/63 | HR 93 | Temp 97.7°F | Resp 20

## 2019-10-28 DIAGNOSIS — Z515 Encounter for palliative care: Secondary | ICD-10-CM

## 2019-10-28 NOTE — Progress Notes (Signed)
COMMUNITY PALLIATIVE CARE RN NOTE  PATIENT NAME: [name] DOB: [birth year] MRN: [id number]  PRIMARY CARE PROVIDER: [name], MD  RESPONSIBLE PARTY: Brien Few (wife) Acct ID - Guarantor Home Phone Work Phone Relationship Acct Type  [id number] [name], [name] [phone]  Self P/F     [address] [place], [place] [address]   Covid-19 Pre-screening Negative  PLAN OF CARE and INTERVENTION:  1. ADVANCE CARE PLANNING/GOALS OF CARE: Goal is for patient to remain at home with his wife.  2. PATIENT/CAREGIVER EDUCATION: Symptom management, safe transfers, edema management, s/s of infection 3. DISEASE STATUS: Met with patient and his wife, [name], in their home. Upon arrival, patient is sitting up in his wheelchair. He remains alert and oriented x 4. He reports soreness in his arms and fingers from pushing himself around. He also experiences phantom pain from his right above the knee amputation. He is taking Neurontin twice daily. He c/o sinus pressure around his eyes and cheek area. This is a chronic issue. He continues with his saline rinses and Flonase to help. He is oxygen dependent and wears a Trilogy machine during the night. His current mask from the Trilogy machine is causing redness across the bridge of his nose. Recommended gel nasal pads for his nose for protection and added cushion. He is now taking Xanax 29m three times per day vs 1/2 tab which has helped calmed breathing and anxiety. He feels tired most of the time. He does have issues with depression. He was started on Remeron 7.5 mg at bedtime and he felt this was making him too drowsy the next day so he stopped taking it. It was recommended that he take 1/2 of that to start to see how he feels. He is contemplating doing this. He is working with [facility] for PT/2x week and has a RSoftware engineer He has another PT evaluation this week to see if they will continue with therapy. He has an appointment for outpatient [facility]. His wife assists with bathing and dressing. She is ordering a shower chair so patient can take a shower vs sponge baths per his request. They tried 2 different in-home sitter agencies but did not feel they could find a good fit. He uses a chair lift for going up and down stairs. He remains able to feed himself. He has a good appetite, but eats smaller portions to prevent indigestion. He has a foley catheter for urinary retention. He has tried several voiding trials but they were unsuccessful. He had an ED visit on 09/15/19 d/t urinary retention, hematuria and lower abdominal discomfort and UA looked suspicious for a UTI. He was treated with a course of antibiotics. He does have some left leg/foot edema, but is improved today. He is taking Lasix 20 mg twice daily and wears a compression stocking. His hearing is worsening, despite use of hearing aids. He has a wound to his coccyx being treated by [facility] RN. She also has an appointment with the [facility]. The wound to his left heel has resolved. He had his flu vaccine yesterday and is waiting 2 weeks prior to receiving his Covid-19 Booster vaccination. Will continue to monitor.   HISTORY OF PRESENT ILLNESS: This is a 86yo male with a h/o CHF, PVD, CAD COPD, and NSTEMI. Palliative care team continues to follow patient and visits monthly and PRN    CODE STATUS: DNR  ADVANCED DIRECTIVES: Y MOST FORM: no PPS: 30%  PHYSICAL EXAM:   VITALS: Today's Vitals   10/28/19 1356  BP: 110/63  Pulse: 93  Resp: 20  Temp: 97.7 F (36.5 C)  TempSrc: Temporal  SpO2: 97%  PainSc: 2   PainLoc: Generalized    LUNGS: clear to auscultation  CARDIAC: Cor RRR EXTREMITIES: Trace edema left leg/foot; compression stocking on SKIN: Wife reports small open wound at coccyx; thin/frail skin with scattered bruising noted to both hands/arms  NEURO: Alert and oriented x 4, HOH, increased generalized weakness, transfers with 1 person  assistance   (Duration of visit and documentation 90 minutes)   [name], RN BSN

## 2019-10-30 DIAGNOSIS — J343 Hypertrophy of nasal turbinates: Secondary | ICD-10-CM | POA: Diagnosis not present

## 2019-10-30 DIAGNOSIS — J310 Chronic rhinitis: Secondary | ICD-10-CM | POA: Diagnosis not present

## 2019-10-30 DIAGNOSIS — J342 Deviated nasal septum: Secondary | ICD-10-CM | POA: Diagnosis not present

## 2019-10-30 DIAGNOSIS — Z4781 Encounter for orthopedic aftercare following surgical amputation: Secondary | ICD-10-CM | POA: Diagnosis not present

## 2019-10-30 DIAGNOSIS — L89622 Pressure ulcer of left heel, stage 2: Secondary | ICD-10-CM | POA: Diagnosis not present

## 2019-10-30 DIAGNOSIS — I110 Hypertensive heart disease with heart failure: Secondary | ICD-10-CM | POA: Diagnosis not present

## 2019-10-30 DIAGNOSIS — L89152 Pressure ulcer of sacral region, stage 2: Secondary | ICD-10-CM | POA: Diagnosis not present

## 2019-10-30 DIAGNOSIS — Z89611 Acquired absence of right leg above knee: Secondary | ICD-10-CM | POA: Diagnosis not present

## 2019-10-30 DIAGNOSIS — I739 Peripheral vascular disease, unspecified: Secondary | ICD-10-CM | POA: Diagnosis not present

## 2019-10-30 DIAGNOSIS — H6121 Impacted cerumen, right ear: Secondary | ICD-10-CM | POA: Diagnosis not present

## 2019-10-30 NOTE — Telephone Encounter (Signed)
Spoke with [name] and pt is not interested in receiving Hospice or Palliative care and [name] wanted to notify someone re elevated HR of 103 all other vitals were normal B/P 128/74, R 29 , and  O2 98 % on 3 l of o2 . Will forward to Dr Tamala Julian for review .[name]

## 2019-10-30 NOTE — Telephone Encounter (Signed)
Erica with [facility] is calling to follow up regarding patient edema and exertional SOB. She also reports his pulse is elevated, at 103 and he is "nasally". She states a calling may be returned to her if our office has any additional questions. Otherwise, a call may be returned to the patient's wife.    Pt c/o swelling: STAT is pt has developed SOB within 24 hours  1) How much weight have you gained and in what time span? No noticeable weight gain   2) If swelling, where is the swelling located? Ankles   3) Are you currently taking a fluid pill? Yes   4) Are you currently SOB? No   5) Do you have a log of your daily weights (if so, list)? No log available   6) Have you gained 3 pounds in a day or 5 pounds in a week? No   7) Have you traveled recently? No    Pt c/o Shortness Of Breath: STAT if SOB developed within the last 24 hours or pt is noticeably SOB on the phone  1. Are you currently SOB (can you hear that pt is SOB on the phone)? N/A   2. How long have you been experiencing SOB? Since this morning at 10:00 AM on exertion  3. Are you SOB when sitting or when up moving around? Only on exertion   4. Are you currently experiencing any other symptoms? No

## 2019-10-31 DIAGNOSIS — L89152 Pressure ulcer of sacral region, stage 2: Secondary | ICD-10-CM | POA: Diagnosis not present

## 2019-10-31 DIAGNOSIS — Z89611 Acquired absence of right leg above knee: Secondary | ICD-10-CM | POA: Diagnosis not present

## 2019-10-31 DIAGNOSIS — L89622 Pressure ulcer of left heel, stage 2: Secondary | ICD-10-CM | POA: Diagnosis not present

## 2019-10-31 DIAGNOSIS — I110 Hypertensive heart disease with heart failure: Secondary | ICD-10-CM | POA: Diagnosis not present

## 2019-10-31 DIAGNOSIS — Z4781 Encounter for orthopedic aftercare following surgical amputation: Secondary | ICD-10-CM | POA: Diagnosis not present

## 2019-10-31 DIAGNOSIS — I739 Peripheral vascular disease, unspecified: Secondary | ICD-10-CM | POA: Diagnosis not present

## 2019-10-31 NOTE — Telephone Encounter (Signed)
[name], please advise where patient can be scheduled.  Thanks

## 2019-10-31 NOTE — Telephone Encounter (Signed)
The heart rate is okay under the circumstances.

## 2019-10-31 NOTE — Telephone Encounter (Signed)
Called and spoke with Patient. Patient stated he received flu vaccine at PCP earlier this week.

## 2019-11-01 DIAGNOSIS — I110 Hypertensive heart disease with heart failure: Secondary | ICD-10-CM | POA: Diagnosis not present

## 2019-11-01 DIAGNOSIS — Z89611 Acquired absence of right leg above knee: Secondary | ICD-10-CM | POA: Diagnosis not present

## 2019-11-01 DIAGNOSIS — L89622 Pressure ulcer of left heel, stage 2: Secondary | ICD-10-CM | POA: Diagnosis not present

## 2019-11-01 DIAGNOSIS — Z4781 Encounter for orthopedic aftercare following surgical amputation: Secondary | ICD-10-CM | POA: Diagnosis not present

## 2019-11-01 DIAGNOSIS — I739 Peripheral vascular disease, unspecified: Secondary | ICD-10-CM | POA: Diagnosis not present

## 2019-11-01 DIAGNOSIS — L89152 Pressure ulcer of sacral region, stage 2: Secondary | ICD-10-CM | POA: Diagnosis not present

## 2019-11-03 ENCOUNTER — Ambulatory Visit: Payer: Medicare Other | Admitting: Internal Medicine

## 2019-11-03 NOTE — Telephone Encounter (Signed)
Spoke with wife and made her aware that Dr. Tamala Julian said HR of 103 is acceptable considering circumstances.  Wife states she is more concerned about edema.  Pt has an appt on Friday to see Dr. Tamala Julian.  She said they will talk to him about it then to see about possibly adjusting diuretics.

## 2019-11-03 NOTE — Telephone Encounter (Signed)
[name] returning call.

## 2019-11-03 NOTE — Telephone Encounter (Signed)
Left message to call back  

## 2019-11-04 DIAGNOSIS — I739 Peripheral vascular disease, unspecified: Secondary | ICD-10-CM | POA: Diagnosis not present

## 2019-11-04 DIAGNOSIS — Z89611 Acquired absence of right leg above knee: Secondary | ICD-10-CM | POA: Diagnosis not present

## 2019-11-04 DIAGNOSIS — Z4781 Encounter for orthopedic aftercare following surgical amputation: Secondary | ICD-10-CM | POA: Diagnosis not present

## 2019-11-04 DIAGNOSIS — L89152 Pressure ulcer of sacral region, stage 2: Secondary | ICD-10-CM | POA: Diagnosis not present

## 2019-11-04 DIAGNOSIS — I110 Hypertensive heart disease with heart failure: Secondary | ICD-10-CM | POA: Diagnosis not present

## 2019-11-04 DIAGNOSIS — L89622 Pressure ulcer of left heel, stage 2: Secondary | ICD-10-CM | POA: Diagnosis not present

## 2019-11-05 DIAGNOSIS — Z89611 Acquired absence of right leg above knee: Secondary | ICD-10-CM | POA: Diagnosis not present

## 2019-11-05 DIAGNOSIS — Z466 Encounter for fitting and adjustment of urinary device: Secondary | ICD-10-CM | POA: Diagnosis not present

## 2019-11-05 DIAGNOSIS — L89152 Pressure ulcer of sacral region, stage 2: Secondary | ICD-10-CM | POA: Diagnosis not present

## 2019-11-05 DIAGNOSIS — J9611 Chronic respiratory failure with hypoxia: Secondary | ICD-10-CM | POA: Diagnosis not present

## 2019-11-05 DIAGNOSIS — Z9981 Dependence on supplemental oxygen: Secondary | ICD-10-CM | POA: Diagnosis not present

## 2019-11-05 DIAGNOSIS — L89622 Pressure ulcer of left heel, stage 2: Secondary | ICD-10-CM | POA: Diagnosis not present

## 2019-11-05 DIAGNOSIS — R531 Weakness: Secondary | ICD-10-CM | POA: Diagnosis not present

## 2019-11-05 DIAGNOSIS — I5032 Chronic diastolic (congestive) heart failure: Secondary | ICD-10-CM | POA: Diagnosis not present

## 2019-11-05 DIAGNOSIS — I87322 Chronic venous hypertension (idiopathic) with inflammation of left lower extremity: Secondary | ICD-10-CM | POA: Diagnosis not present

## 2019-11-05 DIAGNOSIS — I480 Paroxysmal atrial fibrillation: Secondary | ICD-10-CM | POA: Diagnosis not present

## 2019-11-05 DIAGNOSIS — I739 Peripheral vascular disease, unspecified: Secondary | ICD-10-CM | POA: Diagnosis not present

## 2019-11-05 DIAGNOSIS — I110 Hypertensive heart disease with heart failure: Secondary | ICD-10-CM | POA: Diagnosis not present

## 2019-11-05 DIAGNOSIS — R262 Difficulty in walking, not elsewhere classified: Secondary | ICD-10-CM | POA: Diagnosis not present

## 2019-11-05 DIAGNOSIS — E871 Hypo-osmolality and hyponatremia: Secondary | ICD-10-CM | POA: Diagnosis not present

## 2019-11-05 DIAGNOSIS — Z4781 Encounter for orthopedic aftercare following surgical amputation: Secondary | ICD-10-CM | POA: Diagnosis not present

## 2019-11-05 DIAGNOSIS — N139 Obstructive and reflux uropathy, unspecified: Secondary | ICD-10-CM | POA: Diagnosis not present

## 2019-11-05 DIAGNOSIS — D649 Anemia, unspecified: Secondary | ICD-10-CM | POA: Diagnosis not present

## 2019-11-05 DIAGNOSIS — I2510 Atherosclerotic heart disease of native coronary artery without angina pectoris: Secondary | ICD-10-CM | POA: Diagnosis not present

## 2019-11-05 DIAGNOSIS — R339 Retention of urine, unspecified: Secondary | ICD-10-CM | POA: Diagnosis not present

## 2019-11-05 NOTE — Progress Notes (Signed)
Cardiology Office Note:    Date:  11/07/2019   ID:  [name], DOB 1929-12-15, MRN [id number]  PCP:  Travis Cruel, MD  Cardiologist:  Travis Grooms, MD   Referring MD: Travis Cruel, MD   Chief Complaint  Patient presents with  . Coronary Artery Disease  . Shortness of Breath    History of Present Illness:    [name] is a 86 y.o. male with a hx of CADs/pCABG, COPDwith cor pulmonale,HTN, HLD, PAF, PAD and carotid artery disease.Echocardiogram obtained on 03/09/2017 showed EF 60 to 65%, grade 1 DD mild TR, peak PA pressure 24 mmHg. Echocardiogram obtained on 10/08/2018 showed EF>55%, bicuspid aortic valve.recent ischemic right right foot.  He is worried he will lose his left leg.  He feels that edema in the right leg led to sores which led to infection that would not heal which then led to amputation.  He had critical limb ischemia related to arterial disease.  Edema related to dependency, varicose veins, and COPD.  Now he is auto regulating diuretic therapy at home to control edema and his left leg.  ABI on the left leg was not found to have critical reduction in blood flow.  He is now on 40 mg of furosemide a.m. 20 p.m. and Aldactone 25 mg/day.  They have been doing this on their on.  No recent blood work.  No leg pain.  Past Medical History:  Diagnosis Date  . Anxiety   . Arthritis    "generalized" (04/04/2017)  . Asthma   . Basal cell carcinoma    "left ear"  . BPH (benign prostatic hyperplasia)    severe; s/p multiple biopsies  . CAD (coronary artery disease)   . Carotid artery occlusion   . Chronic respiratory failure ([place])   . Chronic rhinitis   . COPD (chronic obstructive pulmonary disease) (HCC)    2L [place] O2  . Diastolic heart failure ([place]) 2019  . Dilation of biliary tract   . Elevated troponin 03/09/2017  . Gallstones   . GERD (gastroesophageal reflux disease)   . History of blood transfusion    "w/his CABG" (04/04/2017)  . History of  kidney stones   . Hyperlipidemia   . Hypertension   . On home oxygen therapy    "~ 24/7" (04/04/2017)  . Peripheral vascular disease ([place])   . Pneumonia 2019  . Squamous cell carcinoma of skin 04/23/2013   in situ-Right flank (txpbx)  . Squamous cell carcinoma of skin 01/23/2017   [place]/ in siut- Left ear rim (CX3FU)  . Syncope and collapse   . Ureteral tumor 08/2015   had endoscopic procedure for evaluation, unable to reach for biopsy  . Urinary catheter (Foley) change required     Past Surgical History:  Procedure Laterality Date  . ABDOMINAL AORTOGRAM W/LOWER EXTREMITY Bilateral 06/30/2019   Procedure: ABDOMINAL AORTOGRAM W/LOWER EXTREMITY;  Surgeon: Waynetta Sandy, MD;  Location: [facility];  Service: Cardiovascular;  Laterality: Bilateral;  . AMPUTATION Right 08/11/2019   Procedure: RIGHT ABOVE KNEE AMPUTATION;  Surgeon: Angelia Mould, MD;  Location: [facility];  Service: Vascular;  Laterality: Right;  . BASAL CELL CARCINOMA EXCISION Left    ear  . CARDIAC CATHETERIZATION     "prior to bypass"  . CATARACT EXTRACTION W/ INTRAOCULAR LENS  IMPLANT, BILATERAL Bilateral   . CORONARY ARTERY BYPASS GRAFT  10/2001   LIMA to LAD, SVG to OM1-2, SVG to RCA and PDA  .  LITHOTRIPSY    . PROSTATE BIOPSY  Oct. 2014  . TONSILLECTOMY      Current Medications: Current Meds  Medication Sig  . ALPRAZolam (XANAX) 0.25 MG tablet TAKE 1 TABLET BY MOUTH THREE TIMES DAILY AS NEEDED  . aspirin 81 MG tablet Take 81 mg by mouth daily.  Marland Kitchen atorvastatin (LIPITOR) 20 MG tablet TAKE 1 TABLET BY MOUTH EVERY DAY (Patient taking differently: Take 20 mg by mouth at bedtime. )  . azithromycin (ZITHROMAX) 250 MG tablet Continue home regimen  . calcium carbonate (TUMS - DOSED IN MG ELEMENTAL CALCIUM) 500 MG chewable tablet Chew 1 tablet (200 mg of elemental calcium total) by mouth 2 (two) times daily as needed for indigestion or heartburn.  . Cholecalciferol (VITAMIN D3) 2000 units TABS  Take 2,000 Units by mouth daily with lunch.   . famotidine (PEPCID) 20 MG tablet Take 20 mg by mouth at bedtime.   . feeding supplement, ENSURE ENLIVE, (ENSURE ENLIVE) LIQD Take 237 mLs by mouth 3 (three) times daily between meals.  . finasteride (PROSCAR) 5 MG tablet Take 5 mg by mouth daily with lunch.   . fluticasone (FLONASE) 50 MCG/ACT nasal spray Place 2 sprays into both nostrils daily.  . furosemide (LASIX) 20 MG tablet Take 20 mg by mouth. Take two tablets by mouth every morning.  Take one tablet by mouth every evening.  . gabapentin (NEURONTIN) 100 MG capsule Take 1 capsule (100 mg total) by mouth 2 (two) times daily.  . hydrocerin (EUCERIN) CREA Apply 1 application topically 2 (two) times daily. To foot  . levalbuterol (XOPENEX HFA) 45 MCG/ACT inhaler Inhale 2 puffs into the lungs every 4 (four) hours as needed for wheezing or shortness of breath.  . levalbuterol (XOPENEX) 0.63 MG/3ML nebulizer solution Take 3 mLs (0.63 mg total) by nebulization every 4 (four) hours as needed for wheezing or shortness of breath.  . levothyroxine (SYNTHROID) 25 MCG tablet Take 1 tablet (25 mcg total) by mouth daily at 6 (six) AM.  . levothyroxine (SYNTHROID) 50 MCG tablet Take 50 mcg by mouth daily.  . melatonin 3 MG TABS tablet Take 1 tablet (3 mg total) by mouth at bedtime.  . metoprolol succinate (TOPROL-XL) 25 MG 24 hr tablet Take 0.5 tablets (12.5 mg total) by mouth daily.  . Multiple Vitamins-Minerals (PRESERVISION AREDS 2) CAPS Take 1 capsule by mouth daily.  Marland Kitchen NITROSTAT 0.4 MG SL tablet Place 0.4 mg under the tongue every 5 (five) minutes as needed for chest pain.  . OXYGEN Inhale 2 L/min into the lungs continuous.   . pantoprazole (PROTONIX) 40 MG tablet TAKE 1 TABLET(40 MG) BY MOUTH DAILY 30 TO 60 MINUTES BEFORE FIRST MEAL OF THE DAY  . Polyethyl Glycol-Propyl Glycol (SYSTANE) 0.4-0.3 % SOLN Apply 1 drop to eye 2 (two) times daily as needed (eye irritation).  . polyethylene glycol (MIRALAX /  GLYCOLAX) 17 g packet Take 17 g by mouth daily.  . potassium chloride 20 MEQ/15ML (10%) SOLN Take 20 mEq by mouth daily.  . predniSONE (DELTASONE) 10 MG tablet FOR FLARE OF COUGH/WHEEZING MAY INCREASE TO 2 DAILY BETTER, THAN 1 TABLET BY MOUTH EVERY DAY  . silodosin (RAPAFLO) 8 MG CAPS capsule Take 8 mg by mouth daily.   . sodium chloride (MURO 128) 5 % ophthalmic solution Place 1 drop into both eyes 4 (four) times daily.  Marland Kitchen spironolactone (ALDACTONE) 25 MG tablet Take 25 mg by mouth daily.  . SYMBICORT 160-4.5 MCG/ACT inhaler Inhale 2 puffs into the  lungs 2 (two) times daily.  . Tiotropium Bromide Monohydrate (SPIRIVA RESPIMAT) 2.5 MCG/ACT AERS INHALE 2 PUFFS BY MOUTH EVERY DAY (Patient taking differently: Inhale 2 puffs into the lungs daily. )  . vitamin B-12 (CYANOCOBALAMIN) 1000 MCG tablet Take 1,000 mcg by mouth daily.   . Zinc Oxide (TRIPLE PASTE) 12.8 % ointment Apply topically as needed for irritation. To gluteal cleft/buttocks  . [DISCONTINUED] furosemide (LASIX) 20 MG tablet Take 1 tablet (20 mg total) by mouth 2 (two) times daily.     Allergies:   Cefdinir, Nitrofurantoin, Sulfa antibiotics, Sulfonamide derivatives, Tape, Ciprofloxacin, Nitrofurantoin, Amoxicillin er, Doxycycline, Levaquin [levofloxacin], and Sertraline   Social History   Socioeconomic History  . Marital status: Married    Spouse name: Not on file  . Number of children: 2  . Years of education: Not on file  . Highest education level: Not on file  Occupational History  . 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 Smoking status: Former Smoker    Packs/day: 2.00    Years: 33.00    Pack years: 66.00    Types: Cigarettes    Quit date: 03/25/1982    Years since quitting: 37.6  . Smokeless tobacco: Never Used  Vaping Use  . Vaping Use: Never used  Substance and Sexual Activity  . Alcohol use: Yes    Alcohol/week: 0.0 standard drinks    Comment: 1.5-3 oz daily in  the past, minimal use in the last few months  . Drug use: No  . Sexual activity: Not on file  Other Topics Concern  . Not on file  Social History Narrative   ** Merged History Encounter **       Pt lives at home with his spouse.         Social Determinants of Health   Financial Resource Strain:   . Difficulty of Paying Living Expenses: Not on file  Food Insecurity: No Food Insecurity  . Worried About Charity fundraiser in the Last Year: Never true  . Ran Out of Food in the Last Year: Never true  Transportation Needs: No Transportation Needs  . Lack of Transportation (Medical): No  . Lack of Transportation (Non-Medical): No  Physical Activity:   . Days of Exercise per Week: Not on file  . Minutes of Exercise per Session: Not on file  Stress:   . Feeling of Stress : Not on file  Social Connections:   . Frequency of Communication with Friends and Family: Not on file  . Frequency of Social Gatherings with Friends and Family: Not on file  . Attends Religious Services: Not on file  . Active Member of Clubs or Organizations: Not on file  . Attends Archivist Meetings: Not on file  . Marital Status: Not on file     Family History: The patient's family history includes Allergies in his brother; Breast cancer in his mother; Cancer in his mother; Heart attack in his father; Heart disease in his brother, brother, father, and mother; Hyperlipidemia in his brother; Hypertension in his brother and mother; Other in his mother; Peripheral vascular disease in his brother; Stroke in his brother.  ROS:   Please see the history of present illness.    Poor appetite.  Unable to hear.  Unable to have conversation with the patient because of essential deafness.  All other systems reviewed and are negative.  EKGs/Labs/Other Studies Reviewed:    The following studies were reviewed today:  Lower extremity ABI June 2021: Summary:  Right: Resting right ankle-brachial index indicates  critical limb  ischemia. The right toe-brachial index is abnormal.   Left: Resting left ankle-brachial index indicates noncompressible left  lower extremity arteries. The left toe-brachial index is abnormal.  And change his diuretic regimen to 40 mg morning of be met at his Lasix to weighted taken in hours 40 mg in the morning and 20 at night along with spironolactone lactone will stay on EKG:  EKG no new data  Recent Labs: 08/13/2019: Magnesium 1.8 08/22/2019: ALT 39 09/22/2019: BUN 23; Creatinine, Ser 1.12; Hemoglobin 10.6; Platelets 258; Potassium 4.0; Sodium 132; TSH 4.920  Recent Lipid Panel    Component Value Date/Time   CHOL 146 03/26/2017 0621   TRIG 34 03/26/2017 0621   HDL 79 03/26/2017 0621   CHOLHDL 1.8 03/26/2017 0621   VLDL 7 03/26/2017 0621   LDLCALC 60 03/26/2017 0621    Physical Exam:    VS:  BP 118/60   Pulse 92   Ht 5' 8"  (1.727 m)   Wt 134 lb (60.8 kg)   SpO2 98%   BMI 20.37 kg/m     Wt Readings from Last 3 Encounters:  11/07/19 134 lb (60.8 kg)  09/25/19 125 lb (56.7 kg)  09/15/19 121 lb 4.1 oz (55 kg)     GEN: Chronically ill in appearance. No acute distress HEENT: Normal NECK: No JVD. LYMPHATICS: No lymphadenopathy CARDIAC:  RRR without murmur, gallop, but there is 1-2+ ankle to mid shin edema in the left leg.  Right stump does not have edema and is well-healed. VASCULAR: Right lower extremity above-the-knee amputation.  Pulses are not palpable in the left lower extremity. RESPIRATORY:  Clear to auscultation without rales, wheezing or rhonchi  ABDOMEN: Soft, non-tender, non-distended, No pulsatile mass, MUSCULOSKELETAL: No deformity  SKIN: Warm and dry NEUROLOGIC:  Alert and oriented x 3 PSYCHIATRIC:  Normal affect   ASSESSMENT:    1. Coronary artery disease involving coronary bypass graft of native heart with angina pectoris ([place])   2. Cor pulmonale, chronic (HCC)/ clinical dx    3. Essential hypertension   4. Hx of AKA (above knee  amputation), right ([place])   5. Chronic diastolic heart failure ([place])   6. Educated about COVID-19 virus infection    PLAN:    In order of problems listed above: 1. He is not having angina. 2. Tends to have lower extremity swelling related to cor pulmonale.  Edema is better on the higher dose of Lasix and he has a reasonable blood pressure.  Basic metabolic panel will be done today. 3. Blood pressure is tolerating current diuretic regimen. 4. Examined.  No sign of infection. 5. Lungs are clear neck veins are flat.  No legitimate reason to increase diuretic therapy.  Continue lower extremity compression and elevation.\ 6. He is vaccinated and has not been infected with Covid.   Medication Adjustments/Labs and Tests Ordered: Current medicines are reviewed at length with the patient today.  Concerns regarding medicines are outlined above.  Orders Placed This Encounter  Procedures  . Basic metabolic panel   No orders of the defined types were placed in this encounter.   Patient Instructions  Medication Instructions:  Your physician recommends that you continue on your current medications as directed. Please refer to the Current Medication list given to you today.  *If you need a refill on your cardiac medications before your next appointment, please call your pharmacy*   Lab Work: BMET  today  If you have labs (blood work) drawn today and your tests are completely normal, you will receive your results only by: Marland Kitchen MyChart Message (if you have MyChart) OR . A paper copy in the mail If you have any lab test that is abnormal or we need to change your treatment, we will call you to review the results.   Testing/Procedures: None   Follow-Up: At [facility], you and your health needs are our priority.  As part of our continuing mission to provide you with exceptional heart care, we have created designated Provider Care Teams.  These Care Teams include your primary Cardiologist  (physician) and Advanced Practice Providers (APPs -  Physician Assistants and Nurse Practitioners) who all work together to provide you with the care you need, when you need it.  We recommend signing up for the patient portal called "MyChart".  Sign up information is provided on this After Visit Summary.  MyChart is used to connect with patients for Virtual Visits (Telemedicine).  Patients are able to view lab/test results, encounter notes, upcoming appointments, etc.  Non-urgent messages can be sent to your provider as well.   To learn more about what you can do with MyChart, go to [email or web address].    Your next appointment:   As needed  The format for your next appointment:   In Person  Provider:   You may see Travis Grooms, MD or one of the following Advanced Practice Providers on your designated Care Team:    [name], NP  Cecilie Kicks, NP  Kathyrn Drown, NP    Other Instructions      Signed, Travis Grooms, MD  11/07/2019 4:40 PM    [address]

## 2019-11-06 ENCOUNTER — Other Ambulatory Visit: Payer: Self-pay

## 2019-11-06 ENCOUNTER — Ambulatory Visit (INDEPENDENT_AMBULATORY_CARE_PROVIDER_SITE_OTHER): Payer: Medicare Other | Admitting: Internal Medicine

## 2019-11-06 ENCOUNTER — Encounter: Payer: Self-pay | Admitting: Internal Medicine

## 2019-11-06 DIAGNOSIS — J9611 Chronic respiratory failure with hypoxia: Secondary | ICD-10-CM

## 2019-11-06 DIAGNOSIS — J9612 Chronic respiratory failure with hypercapnia: Secondary | ICD-10-CM

## 2019-11-06 DIAGNOSIS — I779 Disorder of arteries and arterioles, unspecified: Secondary | ICD-10-CM

## 2019-11-06 DIAGNOSIS — R3914 Feeling of incomplete bladder emptying: Secondary | ICD-10-CM | POA: Diagnosis not present

## 2019-11-06 DIAGNOSIS — J449 Chronic obstructive pulmonary disease, unspecified: Secondary | ICD-10-CM

## 2019-11-06 DIAGNOSIS — N401 Enlarged prostate with lower urinary tract symptoms: Secondary | ICD-10-CM | POA: Diagnosis not present

## 2019-11-06 NOTE — Patient Instructions (Signed)
For nasty mucus >  zmax 250 mg 2 on 1st day and one daily x 4 days and stop.  For worse breathing  > double prednisone to 20 mg daily then 10 mg daily once better   Pulmonary follow up is as needed > defer other options to palliative care.

## 2019-11-06 NOTE — Progress Notes (Signed)
Subjective:     Patient ID: [name], male    DOB: [birth year]   MRN: [id number]   Brief patient profile:  62   yowm MM quit smoking in 1984 with GOLD IV/ 02 dep and steroid dep copd place on NIV by Dr Larkin Ina with profound chronic FTT not improved on NIV    History of Present Illness  05/25/2017  f/u ov/[name] re:  [name] iv copd/ 02 dep  Chief Complaint  Patient presents with  . Follow-up    Increased SOB and  fatigue x last week, 2.5L pulsed, compliant with spiriva, symbicort, and xopenex.   Dyspnea:  At rest/ worse walking 10 ft  Cough: none Sleep: on trilogy ok SABA use:  Confused with saba hfa vs neb  rec Add aldactone 50 mg daily  Continue triegy at your volition Ok to adjust xanax (alprazolam) from one-half to one three times a day to calm your breathing down and relieve anxiety related to breathlessness       09/23/2018  f/u ov/[name] re:  GOLD IV copd/ chronic resp failure, noct NIV dep / pred at 10 mg daily  Chief Complaint  Patient presents with  . Follow-up    F/U per patient for increased SOB and fatigue for the past month. Increased post nasal drip.   Dyspnea:  MMRC4  = sob if tries to leave home or while getting dressed   Cough: no Sleeping: on trilogy flat position  SABA use: about twice a day hfa xoepenex / neb works better and rec by [facility] pulmonary to try perform/bud instead of symbicort  02: 2lpm 24/7  rec Ok to stop symbicort and replace with performist and budesonide when available  See calendar for specific medication instructions and bring it back for each and every office visit for every healthcare provider you see.  Without it,  you may not receive the best quality medical care that we feel you deserve.   02/11/2019  f/u ov/[name] re: COPD GOLD IV 02 dep/ pred at 10 mg daily floor, no better on 10 ceiling  Chief Complaint  Patient presents with  . Follow-up    Pt c/o increased SOB x 5 days. He also c/o sinus congestion. He has been noticing swelling in  both legs over the past month.    Dyspnea:  Across the room assoc with leg swelling worse since decreased aldactone per cards to 12.5 mg daily   Cough: none  Sleeping: on vent  SABA use: more  02: 2-3 lpm 24 / 7 adjusting according to how he feels rather than monitoring his pulse oximeter as recommended. rec Target 02 saturation is over 90% ,  No benefit to driving it higher Add pepcid 20 mg at bedtime Let cardiology know about your leg swelling - aldactone adjustments should be considered          05/19/2019  f/u ov/[name] re: GOLD IV/ chronic resp failure/ got both covid shots Chief Complaint  Patient presents with  . Follow-up    3 month f/u for COPD. States his sinuses have been bothering him for the past 3 weeks. Was given a zpak 3 weeks, it did not help.   Dyspnea: across room limited by R foot now in boot  Cough: none  Sleeping: difficult to do due to leg pain  SABA use: still using hfa xopenex / neb q 3-4 days 02: 2lpm not adjusting  rec Make sure you check your oxygen saturations at highest level of activity  to be sure it stays over 90%     07/22/2019  f/u ov/[name] re: endstage copd  Chief Complaint  Patient presents with  . Acute Visit    increased mucus production x 4 days- white/cream, colored sputum- took zpack recently.    Dyspnea:  At rest Cough: slt more vol/ discoloration/ better on zmax Sleeping: on vent  SABA use: doesn't really help 02: 2lpm 24/7  rec zpak prn   11/06/2019  f/u ov/[name] re:  endstage copd on pred 10 mg plus symb/spiriva  Chief Complaint  Patient presents with  . Follow-up    SOB  Dyspnea:  says worse but saba need less than usual  (doesn't really help) Cough: very weak/ dry sounding  Sleeping: on vent  SABA use: less  02: 2lpm 24/7    No obvious day to day or daytime variability or assoc excess/ purulent sputum or mucus plugs or hemoptysis or cp or chest tightness, subjective wheeze or overt sinus or hb symptoms.   Sleeping on  vent without nocturnal  or early am exacerbation  of respiratory  c/o's or need for noct saba but says feels lousy / not rested on vent. Also denies any obvious fluctuation of symptoms with weather or environmental changes or other aggravating or alleviating factors except as outlined above   No unusual exposure hx or h/o childhood pna/ asthma or knowledge of premature birth.  Current Allergies, Complete Past Medical History, Past Surgical History, Family History, and Social History were reviewed in [employer] record.  ROS  The following are not active complaints unless bolded Hoarseness, sore throat, dysphagia, dental problems, itching, sneezing,  nasal congestion or discharge of excess mucus or purulent secretions, ear ache,   fever, chills, sweats, unintended wt loss or wt gain, classically pleuritic or exertional cp,  orthopnea pnd or arm/hand swelling  or leg swelling, presyncope, palpitations, abdominal pain, anorexia, nausea, vomiting, diarrhea  or change in bowel habits or change in bladder habits, change in stools or change in urine, dysuria, hematuria,  rash, arthralgias, visual complaints, headache, numbness, weakness or ataxia or problems with walking or coordination,  change in mood or  Memory. Extremely difficult hearing        Current Meds  Medication Sig  . ALPRAZolam (XANAX) 0.25 MG tablet TAKE 1 TABLET BY MOUTH THREE TIMES DAILY AS NEEDED  . aspirin 81 MG tablet Take 81 mg by mouth daily.  Marland Kitchen atorvastatin (LIPITOR) 20 MG tablet TAKE 1 TABLET BY MOUTH EVERY DAY (Patient taking differently: Take 20 mg by mouth at bedtime. )  . azithromycin (ZITHROMAX) 250 MG tablet Continue home regimen  . calcium carbonate (TUMS - DOSED IN MG ELEMENTAL CALCIUM) 500 MG chewable tablet Chew 1 tablet (200 mg of elemental calcium total) by mouth 2 (two) times daily as needed for indigestion or heartburn.  . Cholecalciferol (VITAMIN D3) 2000 units TABS Take 2,000 Units by mouth  daily with lunch.   . diclofenac Sodium (VOLTAREN) 1 % GEL Apply 2 g topically 4 (four) times daily as needed.  . famotidine (PEPCID) 20 MG tablet Take 20 mg by mouth at bedtime.   . feeding supplement, ENSURE ENLIVE, (ENSURE ENLIVE) LIQD Take 237 mLs by mouth 3 (three) times daily between meals.  . finasteride (PROSCAR) 5 MG tablet Take 5 mg by mouth daily with lunch.   . fluticasone (FLONASE) 50 MCG/ACT nasal spray Place 2 sprays into both nostrils daily.  . furosemide (LASIX) 20 MG tablet Take 1 tablet (  20 mg total) by mouth 2 (two) times daily.  Marland Kitchen gabapentin (NEURONTIN) 100 MG capsule Take 1 capsule (100 mg total) by mouth 2 (two) times daily.  . hydrocerin (EUCERIN) CREA Apply 1 application topically 2 (two) times daily. To foot  . levalbuterol (XOPENEX HFA) 45 MCG/ACT inhaler Inhale 2 puffs into the lungs every 4 (four) hours as needed for wheezing or shortness of breath.  . levalbuterol (XOPENEX) 0.63 MG/3ML nebulizer solution Take 3 mLs (0.63 mg total) by nebulization every 4 (four) hours as needed for wheezing or shortness of breath.  . levothyroxine (SYNTHROID) 25 MCG tablet Take 1 tablet (25 mcg total) by mouth daily at 6 (six) AM.  . lidocaine (LIDODERM) 5 % Apply to left heel at 8 pm and remove at 8 am daily  . lidocaine (XYLOCAINE) 2 % jelly Apply 1 application topically 2 times daily at 12 noon and 4 pm. To buttocks  . melatonin 3 MG TABS tablet Take 1 tablet (3 mg total) by mouth at bedtime.  . metoprolol succinate (TOPROL-XL) 25 MG 24 hr tablet Take 0.5 tablets (12.5 mg total) by mouth daily.  . Multiple Vitamins-Minerals (PRESERVISION AREDS 2) CAPS Take 1 capsule by mouth daily.  Marland Kitchen NITROSTAT 0.4 MG SL tablet Place 0.4 mg under the tongue every 5 (five) minutes as needed for chest pain.  . OXYGEN Inhale 2 L/min into the lungs continuous.   . pantoprazole (PROTONIX) 20 MG tablet Take 1 tablet (20 mg total) by mouth daily.  . pantoprazole (PROTONIX) 40 MG tablet TAKE 1 TABLET(40  MG) BY MOUTH DAILY 30 TO 60 MINUTES BEFORE FIRST MEAL OF THE DAY  . Polyethyl Glycol-Propyl Glycol (SYSTANE) 0.4-0.3 % SOLN Apply 1 drop to eye 2 (two) times daily as needed (eye irritation).  . polyethylene glycol (MIRALAX / GLYCOLAX) 17 g packet Take 17 g by mouth daily.  . potassium chloride SA (KLOR-CON) 20 MEQ tablet Take 1 tablet (20 mEq total) by mouth daily.  . predniSONE (DELTASONE) 10 MG tablet FOR FLARE OF COUGH/WHEEZING MAY INCREASE TO 2 DAILY BETTER, THAN 1 TABLET BY MOUTH EVERY DAY  . silodosin (RAPAFLO) 8 MG CAPS capsule Take 8 mg by mouth daily.   . sodium chloride (MURO 128) 5 % ophthalmic solution Place 1 drop into both eyes 4 (four) times daily.  Marland Kitchen spironolactone (ALDACTONE) 25 MG tablet Take 1 tablet (25 mg total) by mouth daily. 12.5 mg on day and 25 mg the next day, pt alternates dosages  . SYMBICORT 160-4.5 MCG/ACT inhaler Inhale 2 puffs into the lungs 2 (two) times daily.  . Tiotropium Bromide Monohydrate (SPIRIVA RESPIMAT) 2.5 MCG/ACT AERS INHALE 2 PUFFS BY MOUTH EVERY DAY (Patient taking differently: Inhale 2 puffs into the lungs daily. )  . vitamin B-12 (CYANOCOBALAMIN) 1000 MCG tablet Take 1,000 mcg by mouth daily.   . Zinc Oxide (TRIPLE PASTE) 12.8 % ointment Apply topically as needed for irritation. To gluteal cleft/buttocks  . [DISCONTINUED] amoxicillin (AMOXIL) 500 MG tablet Take 500 mg by mouth 2 (two) times daily. Take one tablet twice a day for 5 days                     Objective:   Physical Exam   W/c bound elderly wm nad very frustrated that he remains so short of breath     07/22/2019 134  02/11/2019   138  wt 159 04/28/2010  > 10/14/2010 154  > 10/04/2011 150> 12/21/2011  150 > 149 01/29/2012 >154  04/05/2012 > 05/22/2012 148 > 09/30/2012  146 >>144> 10/15/2012 > 12/18/2012  145 > 148 02/14/2013 >148 03/14/2013 > 04/25/2013 148 > 06/19/2013 150 >>07/17/2013 > 08/27/2013 148 > 10/23/2013  148 >153 12/09/2013 > 02/26/2014  153 > 06/01/2014  152 > 157 06/15/2014 > 09/21/2014  146 > 10/19/2014 151 >>148 11/19/14 > 02/24/2015 148 > 06/30/2015    140 >  10/04/2015  136 > 12/20/2015   142 > 06/13/2016  132 > 09/13/2016   133 >  11/07/2016  139 > 01/11/2017   135 > 02/20/2017  135 >  03/22/2017  132 > 05/25/2017 134  > 06/27/2017  135 > 08/28/2017   135 > 09/13/2017   128  > 10/29/2017  129 > 12/10/2017  133 >  09/23/2018  133 > 12/23/2018   137    Vital signs reviewed  11/06/2019  - Note at rest 02 sats  98% on 2lpm pulsed        HEENT : pt wearing mask not removed for exam due to covid -19 concerns.    NECK :  without JVD/Nodes/TM/ nl carotid upstrokes bilaterally   LUNGS: no acc muscle use,  Mod barrel  contour chest wall with bilateral  Distant bs s audible wheeze and  without cough on insp or exp maneuvers and mod  Hyperresonant  to  percussion bilaterally     CV:  RRR  no s3 or murmur or increase in P2, and 2+ edema LLE ABD:  soft and nontender with pos mid insp Hoover's  in the supine position. No bruits or organomegaly appreciated, bowel sounds nl  MS:     ext warm with new R AKA - no  calf tenderness, cyanosis or clubbing No obvious joint restrictions   SKIN: warm and dry without lesions    NEURO:  Anxious with  nl sensorium with  no motor or cerebellar deficits apparent.           I personally reviewed images and agree with radiology impression as follows:  CXR:   Pa and lat  08/18/19 Small bilateral pleural effusions.  The lungs are clear

## 2019-11-07 ENCOUNTER — Encounter: Payer: Self-pay | Admitting: Interventional Cardiology

## 2019-11-07 ENCOUNTER — Ambulatory Visit (INDEPENDENT_AMBULATORY_CARE_PROVIDER_SITE_OTHER): Payer: Medicare Other | Admitting: Interventional Cardiology

## 2019-11-07 ENCOUNTER — Ambulatory Visit: Payer: Medicare Other | Admitting: Internal Medicine

## 2019-11-07 ENCOUNTER — Encounter: Payer: Self-pay | Admitting: Internal Medicine

## 2019-11-07 VITALS — BP 118/60 | HR 92 | Ht 68.0 in | Wt 134.0 lb

## 2019-11-07 DIAGNOSIS — I10 Essential (primary) hypertension: Secondary | ICD-10-CM

## 2019-11-07 DIAGNOSIS — Z7189 Other specified counseling: Secondary | ICD-10-CM

## 2019-11-07 DIAGNOSIS — I2781 Cor pulmonale (chronic): Secondary | ICD-10-CM

## 2019-11-07 DIAGNOSIS — I779 Disorder of arteries and arterioles, unspecified: Secondary | ICD-10-CM

## 2019-11-07 DIAGNOSIS — I5032 Chronic diastolic (congestive) heart failure: Secondary | ICD-10-CM | POA: Diagnosis not present

## 2019-11-07 DIAGNOSIS — Z89611 Acquired absence of right leg above knee: Secondary | ICD-10-CM | POA: Diagnosis not present

## 2019-11-07 DIAGNOSIS — I25709 Atherosclerosis of coronary artery bypass graft(s), unspecified, with unspecified angina pectoris: Secondary | ICD-10-CM | POA: Diagnosis not present

## 2019-11-07 NOTE — Assessment & Plan Note (Signed)
study, slow pace,  sob  But no  desat   05/21/15 Eval by Dr Dedio/ rec trial of NIV  - HCO3  10/27/15 = 34  - HC03  10/06/16  = 28 ? On noct vent (non compliant) - As of admit  02/04/17 back on noct vent/ 02 2lpm 24/7 - HC03 36  08/28/2017 despite reporting using noct vent  - HC03 31  01/04/18  - HC03 32   07/10/18  - HC03  29 12/20/18  - HC03  31  09/22/19   sats fine on 2lpm/ has classic pink puff characteristics now   No changes needed  Pulmonary f/u is prn          Each maintenance medication was reviewed in detail including emphasizing most importantly the difference between maintenance and prns and under what circumstances the prns are to be triggered using an action plan format where appropriate.  Total time for H and P, chart review, counseling,   and generating customized AVS unique to this office visit / charting = 18 min

## 2019-11-07 NOTE — Assessment & Plan Note (Signed)
Quit smoking 1984/ MM - PFTs  05/04/05 FEV1 36% ratio 28% with 29% improvement after bronchodilators DLCO 48%  - PFT's 03/26/08 30% ratio 34 with 14% improvement after bronchodilators DLC0 38 %  - Referred to rehab 12/19/2012 > completed March 2015  - changed to spiriva respimat 02/14/2013  - started daily prednisone 06/19/13 > improved only a little> tapered off mid July 2015 > ok to change to prn prednisone 08/27/2013  - flared off nasonex and gerd rx 09/21/2014 > resumed - 10/19/2014  p extensive coaching HFA effectiveness =    90%  - changed pred to ceiling of 20 / floor of 5 mg daily as of 10/19/2014 > changed to floor of 10 mg daily 10/04/2015  - PFT's  06/08/2015  FEV1 0.58 (22 % ) ratio 27  p No sign % improvement from saba p symb/spiriva prior to study with DLCO  9/9 % corrects to 27 % for alv volume   - changed pred to ceiling of 20 and floor of 5 mg daily 06/13/16 > increased floor to 10 mg daily effective 02/20/2017  - referred back to rehab 09/13/2016  - try off noct vent and start titrating up xanax 01/11/2017 > no change but then admitted with flu and placed back on vent  - alpha one AT 03/01/17  MM      - 11/09/2017 COPD exacerbation. No improvement with prn zpack. CXR clear. Given course doxy and pred taper. Resp therapy home eval/treat  - Spirometry 09/16/2018  FEV1 0.60  (23%)  Ratio 0.33 at [facility]  - Added zmax 250 mg daily 07/23/2019 >>> no better so changed back to zpak prn purulent sputum 11/06/2019   He is reaching endstage dz but refuses hospice and I really have never been convinced the NIV is helping him with daytime sob - if fact it may be that it interfering with what would otherwise be a natural well compensated respiratory acidosis but he's convinced he's better on it than off so I did not make any recs other than to seek palliative care for relief of daytime sob if his action plan, which includes doubling pred to 20 mg per day for worse breathing, is not effective.

## 2019-11-07 NOTE — Patient Instructions (Signed)
Medication Instructions:  Your physician recommends that you continue on your current medications as directed. Please refer to the Current Medication list given to you today.  *If you need a refill on your cardiac medications before your next appointment, please call your pharmacy*   Lab Work: BMET today  If you have labs (blood work) drawn today and your tests are completely normal, you will receive your results only by: Marland Kitchen MyChart Message (if you have MyChart) OR . A paper copy in the mail If you have any lab test that is abnormal or we need to change your treatment, we will call you to review the results.   Testing/Procedures: None   Follow-Up: At [facility], you and your health needs are our priority.  As part of our continuing mission to provide you with exceptional heart care, we have created designated Provider Care Teams.  These Care Teams include your primary Cardiologist (physician) and Advanced Practice Providers (APPs -  Physician Assistants and Nurse Practitioners) who all work together to provide you with the care you need, when you need it.  We recommend signing up for the patient portal called "MyChart".  Sign up information is provided on this After Visit Summary.  MyChart is used to connect with patients for Virtual Visits (Telemedicine).  Patients are able to view lab/test results, encounter notes, upcoming appointments, etc.  Non-urgent messages can be sent to your provider as well.   To learn more about what you can do with MyChart, go to [email or web address].    Your next appointment:   As needed  The format for your next appointment:   In Person  Provider:   You may see Sinclair Grooms, MD or one of the following Advanced Practice Providers on your designated Care Team:    [name], NP  Cecilie Kicks, NP  Kathyrn Drown, NP    Other Instructions

## 2019-11-08 LAB — BASIC METABOLIC PANEL
BUN/Creatinine Ratio: 22 (ref 10–24)
BUN: 23 mg/dL (ref 8–27)
CO2: 29 mmol/L (ref 20–29)
Calcium: 9.8 mg/dL (ref 8.6–10.2)
Chloride: 91 mmol/L — ABNORMAL LOW (ref 96–106)
Creatinine, Ser: 1.06 mg/dL (ref 0.76–1.27)
GFR calc Af Amer: 72 mL/min/{1.73_m2} (ref >59)
GFR calc non Af Amer: 62 mL/min/{1.73_m2} (ref >59)
Glucose: 114 mg/dL — ABNORMAL HIGH (ref 65–99)
Potassium: 4.8 mmol/L (ref 3.5–5.2)
Sodium: 132 mmol/L — ABNORMAL LOW (ref 134–144)

## 2019-11-11 ENCOUNTER — Encounter (HOSPITAL_BASED_OUTPATIENT_CLINIC_OR_DEPARTMENT_OTHER): Payer: Medicare Other | Admitting: Internal Medicine

## 2019-11-11 ENCOUNTER — Other Ambulatory Visit: Payer: Self-pay

## 2019-11-11 DIAGNOSIS — I2510 Atherosclerotic heart disease of native coronary artery without angina pectoris: Secondary | ICD-10-CM | POA: Insufficient documentation

## 2019-11-11 DIAGNOSIS — I110 Hypertensive heart disease with heart failure: Secondary | ICD-10-CM | POA: Diagnosis not present

## 2019-11-11 DIAGNOSIS — J449 Chronic obstructive pulmonary disease, unspecified: Secondary | ICD-10-CM | POA: Insufficient documentation

## 2019-11-11 DIAGNOSIS — Z951 Presence of aortocoronary bypass graft: Secondary | ICD-10-CM | POA: Diagnosis not present

## 2019-11-11 DIAGNOSIS — L89623 Pressure ulcer of left heel, stage 3: Secondary | ICD-10-CM | POA: Insufficient documentation

## 2019-11-11 DIAGNOSIS — L89153 Pressure ulcer of sacral region, stage 3: Secondary | ICD-10-CM | POA: Insufficient documentation

## 2019-11-11 DIAGNOSIS — I509 Heart failure, unspecified: Secondary | ICD-10-CM | POA: Diagnosis not present

## 2019-11-11 DIAGNOSIS — I739 Peripheral vascular disease, unspecified: Secondary | ICD-10-CM | POA: Diagnosis not present

## 2019-11-12 DIAGNOSIS — I480 Paroxysmal atrial fibrillation: Secondary | ICD-10-CM | POA: Diagnosis not present

## 2019-11-12 DIAGNOSIS — I110 Hypertensive heart disease with heart failure: Secondary | ICD-10-CM | POA: Diagnosis not present

## 2019-11-12 DIAGNOSIS — L89622 Pressure ulcer of left heel, stage 2: Secondary | ICD-10-CM | POA: Diagnosis not present

## 2019-11-12 DIAGNOSIS — I739 Peripheral vascular disease, unspecified: Secondary | ICD-10-CM | POA: Diagnosis not present

## 2019-11-12 DIAGNOSIS — I5032 Chronic diastolic (congestive) heart failure: Secondary | ICD-10-CM | POA: Diagnosis not present

## 2019-11-12 DIAGNOSIS — L89152 Pressure ulcer of sacral region, stage 2: Secondary | ICD-10-CM | POA: Diagnosis not present

## 2019-11-12 NOTE — Progress Notes (Signed)
[name], [name] ([id number]) Visit Report for 11/11/2019 Abuse/Suicide Risk Screen Details Patient Name: Date of Service: DO RN, [place] V ID L. 11/11/2019 2:45 PM Medical Record Number: [id number] Patient Account Number: [id number] Date of Birth/Sex: Treating RN: [birth year] (86 y.o. [name] Primary Care Nikaya Nasby: [name] Other Clinician: Referring Daichi Moris: Treating Jonda Alanis/Extender: [name] in Treatment: 0 Abuse/Suicide Risk Screen Items Answer ABUSE RISK SCREEN: Has anyone close to you tried to hurt or harm you recentlyo No Do you feel uncomfortable with anyone in your familyo No Has anyone forced you do things that you didnt want to doo No Electronic Signature(s) Signed: 11/12/2019 5:51:45 PM By: Baruch Gouty RN, BSN Entered By: Baruch Gouty on 11/11/2019 15:13:36 -------------------------------------------------------------------------------- Activities of Daily Living Details Patient Name: Date of Service: DO RN, [name] 11/11/2019 2:45 PM Medical Record Number: [id number] Patient Account Number: [id number] Date of Birth/Sex: Treating RN: [birth year] (86 y.o. [name] Primary Care Coraline Talwar: [name] Other Clinician: Referring Tazia Illescas: Treating Tanisa Lagace/Extender: [name] in Treatment: 0 Activities of Daily Living Items Answer Activities of Daily Living (Please select one for each item) Drive Automobile Not Able T Medications ake Need Assistance Use T elephone Need Assistance Care for Appearance Need Assistance Use T oilet Need Assistance Bath / Shower Need Assistance Dress Self Need Assistance Feed Self Completely Able Walk Not Able Get In / Out Bed Need Assistance Housework Not Able Prepare Meals Not Able Handle Money Not Able Shop for Self Not Able Electronic Signature(s) Signed: 11/12/2019 5:51:45 PM By: Baruch Gouty RN, BSN Entered By: Baruch Gouty on 11/11/2019  15:14:34 -------------------------------------------------------------------------------- Education Screening Details Patient Name: Date of Service: DO RN, [name] 11/11/2019 2:45 PM Medical Record Number: [id number] Patient Account Number: [id number] Date of Birth/Sex: Treating RN: 1929-03-15 (86 y.o. [name] Primary Care Vanna Sailer: [name] Other Clinician: Referring Ndea Kilroy: Treating Oliana Gowens/Extender: [name] in Treatment: 0 Primary Learner Assessed: Patient Learning Preferences/Education Level/Primary Language Learning Preference: Explanation, Demonstration, Printed Material Highest Education Level: College or Above Preferred Language: English Cognitive Barrier Language Barrier: No Translator Needed: No Memory Deficit: No Emotional Barrier: No Cultural/Religious Beliefs Affecting Medical Care: No Physical Barrier Impaired Vision: Yes Glasses Impaired Hearing: Yes Hearing Aid, hard of hearing Decreased Hand dexterity: No Knowledge/Comprehension Knowledge Level: High Comprehension Level: High Ability to understand written instructions: High Ability to understand verbal instructions: High Motivation Anxiety Level: Calm Cooperation: Cooperative Education Importance: Acknowledges Need Interest in Health Problems: Asks Questions Perception: Coherent Willingness to Engage in Self-Management High Activities: Readiness to Engage in Self-Management High Activities: Electronic Signature(s) Signed: 11/12/2019 5:51:45 PM By: Baruch Gouty RN, BSN Entered By: Baruch Gouty on 11/11/2019 15:15:33 -------------------------------------------------------------------------------- Fall Risk Assessment Details Patient Name: Date of Service: DO RN, [name] 11/11/2019 2:45 PM Medical Record Number: [id number] Patient Account Number: [id number] Date of Birth/Sex: Treating RN: Apr 24, 1929 (86 y.o. [name] Primary Care  Kathelene Rumberger: [name] Other Clinician: Referring Verla Bryngelson: Treating Keshav Winegar/Extender: [name] in Treatment: 0 Fall Risk Assessment Items Have you had 2 or more falls in the last 12 monthso 0 No Have you had any fall that resulted in injury in the last 12 monthso 0 No FALLS RISK SCREEN History of falling - immediate or within 3 months 0 No Secondary diagnosis (Do you have 2 or more medical diagnoseso) 0 No Ambulatory aid None/bed rest/wheelchair/nurse 0 Yes Crutches/cane/walker 0 No Furniture 0 No Intravenous therapy  Access/Saline/Heparin Lock 0 No Gait/Transferring Normal/ bed rest/ wheelchair 0 Yes Weak (short steps with or without shuffle, stooped but able to lift head while walking, may seek 0 No support from furniture) Impaired (short steps with shuffle, may have difficulty arising from chair, head down, impaired 0 No balance) Mental Status Oriented to own ability 0 Yes Electronic Signature(s) Signed: 11/12/2019 5:51:45 PM By: Baruch Gouty RN, BSN Entered By: Baruch Gouty on 11/11/2019 15:15:58 -------------------------------------------------------------------------------- Foot Assessment Details Patient Name: Date of Service: DO RN, [name] 11/11/2019 2:45 PM Medical Record Number: [id number] Patient Account Number: [id number] Date of Birth/Sex: Treating RN: 19-May-1929 (86 y.o. [name] Primary Care Embrie Mikkelsen: [name] Other Clinician: Referring Pebble Botkin: Treating Amarria Andreasen/Extender: [name] in Treatment: 0 Foot Assessment Items [x]  Unable to perform right foot assessment due to amputation Site Locations + = Sensation present, - = Sensation absent, C = Callus, U = Ulcer R = Redness, W = Warmth, M = Maceration, PU = Pre-ulcerative lesion F = Fissure, S = Swelling, D = Dryness Assessment Right: Left: Other Deformity: No Prior Foot Ulcer: No Prior Amputation: No Charcot Joint:  No Ambulatory Status: Non-ambulatory Assistance Device: Wheelchair Gait: Electronic Signature(s) Signed: 11/12/2019 5:51:45 PM By: Baruch Gouty RN, BSN Entered By: Baruch Gouty on 11/11/2019 15:19:38 -------------------------------------------------------------------------------- Nutrition Risk Screening Details Patient Name: Date of Service: DO RN, [name] 11/11/2019 2:45 PM Medical Record Number: [id number] Patient Account Number: [id number] Date of Birth/Sex: Treating RN: 10/03/1929 (86 y.o. [name] Primary Care Daneil Beem: [name] Other Clinician: Referring Lynee Rosenbach: Treating Jil Penland/Extender: [name] in Treatment: 0 Height (in): 69 Weight (lbs): Body Mass Index (BMI): Nutrition Risk Screening Items Score Screening NUTRITION RISK SCREEN: I have an illness or condition that made me change the kind and/or amount of food I eat 0 No I eat fewer than two meals per day 0 No I eat few fruits and vegetables, or milk products 0 No I have three or more drinks of beer, liquor or wine almost every day 0 No I have tooth or mouth problems that make it hard for me to eat 0 No I don't always have enough money to buy the food I need 0 No I eat alone most of the time 0 No I take three or more different prescribed or over-the-counter drugs a day 1 Yes Without wanting to, I have lost or gained 10 pounds in the last six months 0 No I am not always physically able to shop, cook and/or feed myself 0 No Nutrition Protocols Good Risk Protocol 0 No interventions needed Moderate Risk Protocol High Risk Proctocol Risk Level: Good Risk Score: 1 Electronic Signature(s) Signed: 11/12/2019 5:51:45 PM By: Baruch Gouty RN, BSN Entered By: Baruch Gouty on 11/11/2019 15:17:33

## 2019-11-12 NOTE — Progress Notes (Signed)
[name], [name] [name] ([id number]) Visit Report for 11/11/2019 Chief Complaint Document Details Patient Name: Date of Service: DO RN, [place] V ID L. 11/11/2019 2:45 PM Medical Record Number: [id number] Patient Account Number: [id number] Date of Birth/Sex: Treating RN: [birth year] (86 y.o. [name]) [name] Primary Care Provider: [name] Other Clinician: Referring Provider: Treating Provider/Extender: [name] in Treatment: 0 Information Obtained from: Patient Chief Complaint Right forearm skin tear 04/21/2019; patient is here for review of wound on the right medial great toe 06/23/2019: patient has new skin tears to the right forearm and left elbow 11/11/2019; patient is here for a wound on the left lateral heel and sacrum Electronic Signature(s) Signed: 11/11/2019 4:57:00 PM By: [name] MD Entered By: [name] on 11/11/2019 16:39:03 -------------------------------------------------------------------------------- Debridement Details Patient Name: Date of Service: DO RN, [name] 11/11/2019 2:45 PM Medical Record Number: [id number] Patient Account Number: [id number] Date of Birth/Sex: Treating RN: 1929/08/28 (86 y.o. [name] Primary Care Provider: [name] Other Clinician: Referring Provider: Treating Provider/Extender: [name] in Treatment: 0 Debridement Performed for Assessment: Wound #15 Sacrum Performed By: Physician Ricard Dillon., MD Debridement Type: Debridement Level of Consciousness (Pre-procedure): Awake and Alert Pre-procedure Verification/Time Out Yes - 16:31 Taken: Start Time: 16:31 Pain Control: Lidocaine 5% topical ointment T Area Debrided (L x W): otal 0.7 (cm) x 0.4 (cm) = 0.28 (cm) Tissue and other material debrided: Viable, Non-Viable, Slough, Skin: Dermis , Skin: Epidermis, Slough Level: Skin/Epidermis Debridement Description: Selective/Open Wound Instrument: Curette Bleeding:  Moderate Hemostasis Achieved: Silver Nitrate End Time: 16:36 Procedural Pain: 0 Post Procedural Pain: 0 Response to Treatment: Procedure was tolerated well Level of Consciousness (Post- Awake and Alert procedure): Post Debridement Measurements of Total Wound Length: (cm) 0.7 Stage: Category/Stage III Width: (cm) 0.4 Depth: (cm) 0.1 Volume: (cm) 0.022 Character of Wound/Ulcer Post Debridement: Improved Post Procedure Diagnosis Same as Pre-procedure Electronic Signature(s) Signed: 11/11/2019 4:57:00 PM By: [name] MD Signed: 11/12/2019 9:08:43 AM By: [name] RN Entered By: [name] on 11/11/2019 16:38:58 -------------------------------------------------------------------------------- HPI Details Patient Name: Date of Service: DO RN, [name] 11/11/2019 2:45 PM Medical Record Number: [id number] Patient Account Number: [id number] Date of Birth/Sex: Treating RN: [birth year] (86 y.o. [name] Primary Care Provider: [name] Other Clinician: Referring Provider: Treating Provider/Extender: [name] in Treatment: 0 History of Present Illness HPI Description: 01/30/18 on evaluation today patient presents for initial inspection concerning the skin tear of the right forearm. Fortunately there does not appear to be any evidence of infection and this occurred approximately three days ago. He has been using Vaseline over the region. With that being said the patient does have a history of cataracts COPD, hypertension, peripheral arterial disease, and osteoarthritis. He tells me that he does have a little bit of pain at the site but nothing too significant at this time which is good news. Fortunately this overall appears to be doing fairly well which is good news. No fevers, chills, nausea, or vomiting noted at this time. The biggest issue at this point is that the wound is of quite significant size. READMISSION 04/21/2019 This is a  86 year old man with multiple medical problems who was seen once in the clinic here in January 2020 with a skin tear on his right forearm seen by [name]. This problem this time started sometime recently. He was noted by podiatry to have erosions and wounds of his right foot on toes 1-3.  He also saw [name] of dermatology who gave him Silvadene cream for what I think was felt to be a stasis dermatitis of his bilateral lower legs. At some point he was referred to [name]. He was also noted to have previous noninvasive arterial studies that showed no flow to either one of his toes and noncompressible ABIs bilaterally. [name] did noninvasive arterial studies on him. These showed that he had greater than 75% stenosis in the proximal and mid common femoral artery. Monophasic flow in the posterior tibial and dorsalis pedis positions on the right foot. ABIs again were noncompressible. He was felt to have total occlusion of the superficial femoral artery on the right. The overall interpretation is that he had severe multilevel arterial occlusive disease. Absent femoral pulses and superficial femoral artery occlusions. In spite of this he was not felt to be candidate for arteriography given his bilateral femoral artery occlusions as well he would not be a candidate for endovascular approach secondary to his common femoral artery occlusions and proximal iliac disease. He was not felt to be a candidate for an open infra inguinal bypass. He therefore felt he would need to be monitored over time with the only option being a right below-knee amputation The patient complains currently of pain at night when he is up in bed. The pain in the right foot is better when he puts the leg down over the bed side. This is compatible with rest pain. He has very limited activity i.e. is even exhausted getting dressed in the morning because of cardiopulmonary issues therefore it is difficult to gauge claudication I  believe he was referred here by Dr. [name] with one of the major issues is whether he would be a candidate for hyperbaric oxygen. He is not a diabetic. Dr. [name] did give him Silvadene cream for dry flaking skin in his bilateral lower extremities, however the patient is concerned about using this in the face of sulfa allergy. 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 This is on the medial aspect extended there is a second area on the plantar aspect. Most of what the patient talks about is claudication with minimal activity [however the patient is limited by his COPD], or even at rest at night. He is not getting a lot of sleep. We use silver collagen on this wound 5/4; the patient has a wound on the tip of his right great toe also on the medial aspect. Setting of severe nonrevascularizable PAD [see description in HPI]. He does not describe any change in his pain. We have been using silver collagen 5/18; 2-week follow-up. The patient has wound on the tip of his right great toe as well as the medial aspect. He has developed new wounds x2 on the right lateral foot and the right lateral calcaneus. These look like ischemic areas. The patient talks about claudication at night. He seems to be more comfortable during the day. We have been using silver collagen to the wounds. I have previously debrided the eschar on the toes however this comes right back. 6/1; 2-week follow-up. This patient has ischemic wounds on his right foot in the setting of nonunreconstructable PAD [previously reviewed by  Dr. Dixon]. He does not really describe severe pain but he does have episodic pain in the  right great toe. He has an area on the tip of the right great toe as well as the medial aspect of the right great toe. A new area on the tip of the right fourth toe. He has the area on the right lateral foot fortunately the area on the right lateral heel appears to have epithelialized over. We are using silver collagen to all wounds 06/23/2019 upon evaluation today patient appears to be doing about the same in regard to his foot ulcers. Unfortunately he has 2 new skin tears on his left elbow and the right forearm that were new as of today. He states that it was doing okay until they went to change the dressing and they put some collagen on it that got stuck and somewhat pulled back the skin unfortunately. Fortunately there is no signs of active infection at this time. No fevers, chills, nausea, vomiting, or diarrhea. 6/29; the patient was hospitalized from 6/18 through 6/23 going in with increasing drainage from the wounds on his right foot. He underwent a angiogram on 06/30/2019 by Dr. Donzetta Matters which revealed multilevel arterial disease including occlusion of the right common femoral artery. He reconstitutes the profunda femoris and the below-knee popliteal artery and appears to have posterior tibial peroneal runoff to the ankle although there is heavy calcification of the popliteal below the knee. He was offered consideration of a right above-knee amputation, palliative and/or hospice care and consideration of a very high risk attempt at bypass. He did receive IV antibiotics. He comes in the clinic today again with the 2 areas that are necrotic on the left great toe and a small punched out area on the right lateral foot. A lot of the erythema on top of the right foot but no warmth. He has a lot of swelling here. The patient is literally tortured over this decision that he needs to make. I went over this with him in exceptional detail. I do not believe he is a candidate for any form of open bypass surgery.  I think his vascular doctors would agree with that. He still has pain at night when he puts his leg up on the bed that is relieved by putting his leg dependent. This is not different from what I remember. He is not septic there is no additional skin breakdown that I can see 7/8 the patient continues to have severe pain in the right foot at night making it difficult for him to rest. There is increasing erythema in the right foot but the foot is cold. I do not think this is an infection I think this is likely to be tissue necrosis from ischemia. I thought he would have made an up decision today about going forward with hospice he has not. He did not go to see [name] on 7/6. Again he wants to talk to me at length about amputations. I do not think he will do well after an above-knee amputation on the right. Furthermore I do not think he is a candidate for bypass surgery on the right leg 7/20; patient still having a lot of pain in the right foot. He saw Dr. Donzetta Matters on 7/16. He is not felt to be able to undergo a complex revascularization and I certainly agree with that. He was offered a right below-knee amputation but he has not made that decision. I have repetitively suggested hospice care if he  will not undergo an amputation he is not decided he wants to go that route either. He arrives in the clinic today accompanied by his son who is a Stage manager and his wife. I think the patient has tissue breakdown in the dorsal foot secondary to ischemia although his son brought up that some of the erythema could be a contact dermatitis as they were apparently layering a large area of pure call over the wound area. I was not aware of this not supposed to be doing it in this fashion. In any case the erythema look better than last week and I am quite convinced this is not cellulitis READMISSION 11/11/2019 Mr. Paula Libra is now an 86 year old man who we had in this clinic from [place] through July/21 largely as a result of  ischemic wounds in his right foot. He was followed by vein and vascular [name]. His anatomy was not felt to be amenable to a revascularization. The patient had increasing ischemic pain but was very resistant to the notion of an amputation. Eventually he did go through with this on August 22 he will underwent an AKA by [name]. He went to rehabilitation. There he developed a small stage III lower sacral pressure ulcer and an area on his left lateral heel which is also probably a pressure ulcer. They have been using silver collagen on both wound areas. The patient states he is not in any unrelieved pain. He is in a wheelchair at home with a Roho cushion. He is able to do his own transfers. I think he is probably putting far too much pressure on these areas Past medical history is reviewed. He has a history of CHF, severe PAD, hypertension, coronary artery disease status post CABG, COPD Gold stage IV on O2, hypothyroidism and a recent problem with urinary retention he now has a Foley catheter. He has had problems with left leg swelling and adjustment of his diuretics he is followed by Dr. Daneen Schick. The patient has severe PAD. On 06/30/2019 he had an angiogram on the remaining left side he had heavily diseased femoral artery. His superficial femoral artery was flush occluded with runoff in the profunda. He was felt his SFA was likely occluded and reconstitutes the popliteal artery via runoff via the peroneal and posterior tibial artery. As noted he is not in a lot of pain. His last noninvasive study was on 6/20. On the left side this was noncompressible ABI but a TBI of only 0.20 with dampened monophasic waveforms Electronic Signature(s) Signed: 11/11/2019 4:57:00 PM By: [name] MD Entered By: [name] on 11/11/2019 16:44:26 -------------------------------------------------------------------------------- Physical Exam Details Patient Name: Date of Service: DO RN, [name]  11/11/2019 2:45 PM Medical Record Number: [id number] Patient Account Number: [id number] Date of Birth/Sex: Treating RN: [birth year] (86 y.o. [name] Primary Care Provider: [name] Other Clinician: Referring Provider: Treating Provider/Extender: [name] in Treatment: 0 Constitutional Sitting or standing Blood Pressure is within target range for patient.. Pulse regular and within target range for patient.Marland Kitchen Respirations regular, non-labored and within target range.. Temperature is normal and within the target range for the patient.Marland Kitchen Appears in no distress. Ears, Nose, Mouth, and Throat Patient is hard of hearing.. Cardiovascular I could not feel a palpable popliteal or inguinal pulse. Pedal pulses are absent. Psychiatric appears at normal baseline. Notes Wound exam The patient has 2 small areas. One on the lower sacrum. This is a small but fairly deep wound. Rolled senescent edges.  On the lateral part of his left heel nonweightbearing surface there is a small open area as well. Some maceration of the skin around this On both areas I think there is stage I pressure damage. Electronic Signature(s) Signed: 11/11/2019 4:57:00 PM By: [name] MD Entered By: [name] on 11/11/2019 16:46:32 -------------------------------------------------------------------------------- Physician Orders Details Patient Name: Date of Service: DO RN, [name] 11/11/2019 2:45 PM Medical Record Number: [id number] Patient Account Number: [id number] Date of Birth/Sex: Treating RN: 1929/11/26 (86 y.o. [name] Primary Care Provider: [name] Other Clinician: Referring Provider: Treating Provider/Extender: [name] in Treatment: 0 Verbal / Phone Orders: No Diagnosis Coding Follow-up Appointments Return Appointment in 2 weeks. Dressing Change Frequency Change Dressing every other day. Wound Cleansing Wound #14  Left,Lateral Calcaneus May shower and wash wound with soap and water. Wound #15 Sacrum May shower and wash wound with soap and water. Primary Wound Dressing Wound #14 Left,Lateral Calcaneus Calcium Alginate with Silver Wound #15 Sacrum Silver Collagen - moisten with normal saline Secondary Dressing Wound #14 Left,Lateral Calcaneus Foam Border Wound #15 [place] Wound #15 [facility] for wound care. - [facility] Electronic Signature(s) Signed: 11/11/2019 4:57:00 PM By: [name] MD Signed: 11/12/2019 9:08:43 AM By: [name] RN Entered By: [name] on 11/11/2019 16:35:57 -------------------------------------------------------------------------------- Problem List Details Patient Name: Date of Service: DO RN, [name] 11/11/2019 2:45 PM Medical Record Number: [id number] Patient Account Number: [id number] Date of Birth/Sex: Treating RN: July 11, 1929 (86 y.o. [name] Primary Care Provider: [name] Other Clinician: Referring Provider: Treating Provider/Extender: [name] in Treatment: 0 Active Problems ICD-10 Encounter Code Description Active Date MDM Diagnosis L89.153 Pressure ulcer of sacral region, stage 3 11/11/2019 No Yes L89.623 Pressure ulcer of left heel, stage 3 11/11/2019 No Yes Z89.611 Acquired absence of right leg above knee 11/11/2019 No Yes Inactive Problems Resolved Problems Electronic Signature(s) Signed: 11/11/2019 4:57:00 PM By: [name] MD Entered By: [name] on 11/11/2019 16:38:27 -------------------------------------------------------------------------------- Progress Note Details Patient Name: Date of Service: DO RN, [name] 11/11/2019 2:45 PM Medical Record Number: [id number] Patient Account Number: [id number] Date of Birth/Sex: Treating RN: [birth year] (86 y.o. [name] Primary Care Provider: [name] Other  Clinician: Referring Provider: Treating Provider/Extender: [name] in Treatment: 0 Subjective Chief Complaint Information obtained from Patient Right forearm skin tear 04/21/2019; patient is here for review of wound on the right medial great toe 06/23/2019: patient has new skin tears to the right forearm and left elbow 11/11/2019; patient is here for a wound on the left lateral heel and sacrum History of Present Illness (HPI) 01/30/18 on evaluation today patient presents for initial inspection concerning the skin tear of the right forearm. Fortunately there does not appear to be any evidence of infection and this occurred approximately three days ago. He has been using Vaseline over the region. With that being said the patient does have a history of cataracts COPD, hypertension, peripheral arterial disease, and osteoarthritis. He tells me that he does have a little bit of pain at the site but nothing too significant at this time which is good news. Fortunately this overall appears to be doing fairly well which is good news. No fevers, chills, nausea, or vomiting noted at this time. The biggest issue at this point is that the wound is of quite significant size. READMISSION 04/21/2019 This is a 86 year old man  with multiple medical problems who was seen once in the clinic here in January 2020 with a skin tear on his right forearm seen by [name]. This problem this time started sometime recently. He was noted by podiatry to have erosions and wounds of his right foot on toes 1-3. He also saw [name] of dermatology who gave him Silvadene cream for what I think was felt to be a stasis dermatitis of his bilateral lower legs. At some point he was referred to [name]. He was also noted to have previous noninvasive arterial studies that showed no flow to either one of his toes and noncompressible ABIs bilaterally. [name] did noninvasive arterial studies on him. These  showed that he had greater than 75% stenosis in the proximal and mid common femoral artery. Monophasic flow in the posterior tibial and dorsalis pedis positions on the right foot. ABIs again were noncompressible. He was felt to have total occlusion of the superficial femoral artery on the right. The overall interpretation is that he had severe multilevel arterial occlusive disease. Absent femoral pulses and superficial femoral artery occlusions. In spite of this he was not felt to be candidate for arteriography given his bilateral femoral artery occlusions as well he would not be a candidate for endovascular approach secondary to his common femoral artery occlusions and proximal iliac disease. He was not felt to be a candidate for an open infra inguinal bypass. He therefore felt he would need to be monitored over time with the only option being a right below-knee amputation The patient complains currently of pain at night when he is up in bed. The pain in the right foot is better when he puts the leg down over the bed side. This is compatible with rest pain. He has very limited activity i.e. is even exhausted getting dressed in the morning because of cardiopulmonary issues therefore it is difficult to gauge claudication I believe he was referred here by Dr. [name] with one of the major issues is whether he would be a candidate for hyperbaric oxygen. He is not a diabetic. Dr. [name] did give him Silvadene cream for dry flaking skin in his bilateral lower extremities, however the patient is concerned about using this in the face of sulfa allergy. Podiatry had previously given him in a steroid cream I do not believe these use this either Past medical history is extensive including basal cell and squamous cell skin cancers, combined congestive heart failure, O2 dependent COPD, cor pulmonale, venous stasis dermatitis, peripheral arterial disease, hearing loss, 4/20; the patient still has a wound on the  right first toe. This is on the medial aspect extended there is a second area on the plantar aspect. Most of what the patient talks about is claudication with minimal activity [however the patient is limited by his COPD], or even at rest at night. He is not getting a lot of sleep. We use silver collagen on this wound 5/4; the patient has a wound on the tip of his right great toe also on the medial aspect. Setting of severe nonrevascularizable PAD [see description in HPI]. He does not describe any change in his pain. We have been using silver collagen 5/18; 2-week follow-up. The patient has wound on the tip of his right great toe as well as the medial aspect. He has developed new wounds x2 on the right lateral foot and the right lateral calcaneus. These look like ischemic areas. The patient talks about claudication at night. He  seems to be more comfortable during the day. We have been using silver collagen to the wounds. I have previously debrided the eschar on the toes however this comes right back. 6/1; 2-week follow-up. This patient has ischemic wounds on his right foot in the setting of nonunreconstructable PAD [previously reviewed by Dr. Dixon]. He does not really describe severe pain but he does have episodic pain in the right great toe. He has an area on the tip of the right great toe as well as the medial aspect of the right great toe. A new area on the tip of the right fourth toe. He has the area on the right lateral foot fortunately the area on the right lateral heel appears to have epithelialized over. We are using silver collagen to all wounds 06/23/2019 upon evaluation today patient appears to be doing about the same in regard to his foot ulcers. Unfortunately he has 2 new skin tears on his left elbow and the right forearm that were new as of today. He states that it was doing okay until they went to change the dressing and they put some collagen on it that got stuck and somewhat pulled back  the skin unfortunately. Fortunately there is no signs of active infection at this time. No fevers, chills, nausea, vomiting, or diarrhea. 6/29; the patient was hospitalized from 6/18 through 6/23 going in with increasing drainage from the wounds on his right foot. He underwent a angiogram on 06/30/2019 by Dr. Donzetta Matters which revealed multilevel arterial disease including occlusion of the right common femoral artery. He reconstitutes the profunda femoris and the below-knee popliteal artery and appears to have posterior tibial peroneal runoff to the ankle although there is heavy calcification of the popliteal below the knee. He was offered consideration of a right above-knee amputation, palliative and/or hospice care and consideration of a very high risk attempt at bypass. He did receive IV antibiotics. He comes in the clinic today again with the 2 areas that are necrotic on the left great toe and a small punched out area on the right lateral foot. A lot of the erythema on top of the right foot but no warmth. He has a lot of swelling here. The patient is literally tortured over this decision that he needs to make. I went over this with him in exceptional detail. I do not believe he is a candidate for any form of open bypass surgery. I think his vascular doctors would agree with that. He still has pain at night when he puts his leg up on the bed that is relieved by putting his leg dependent. This is not different from what I remember. He is not septic there is no additional skin breakdown that I can see 7/8 the patient continues to have severe pain in the right foot at night making it difficult for him to rest. There is increasing erythema in the right foot but the foot is cold. I do not think this is an infection I think this is likely to be tissue necrosis from ischemia. I thought he would have made an up decision today about going forward with hospice he has not. He did not go to see [name] on 7/6. Again  he wants to talk to me at length about amputations. I do not think he will do well after an above-knee amputation on the right. Furthermore I do not think he is a candidate for bypass surgery on the right leg 7/20; patient still having a lot  of pain in the right foot. He saw Dr. Donzetta Matters on 7/16. He is not felt to be able to undergo a complex revascularization and I certainly agree with that. He was offered a right below-knee amputation but he has not made that decision. I have repetitively suggested hospice care if he will not undergo an amputation he is not decided he wants to go that route either. He arrives in the clinic today accompanied by his son who is a Stage manager and his wife. I think the patient has tissue breakdown in the dorsal foot secondary to ischemia although his son brought up that some of the erythema could be a contact dermatitis as they were apparently layering a large area of pure call over the wound area. I was not aware of this not supposed to be doing it in this fashion. In any case the erythema look better than last week and I am quite convinced this is not cellulitis READMISSION 11/11/2019 Mr. Paula Libra is now an 87 year old man who we had in this clinic from [place] through July/21 largely as a result of ischemic wounds in his right foot. He was followed by vein and vascular [name]. His anatomy was not felt to be amenable to a revascularization. The patient had increasing ischemic pain but was very resistant to the notion of an amputation. Eventually he did go through with this on August 22 he will underwent an AKA by [name]. He went to rehabilitation. There he developed a small stage III lower sacral pressure ulcer and an area on his left lateral heel which is also probably a pressure ulcer. They have been using silver collagen on both wound areas. The patient states he is not in any unrelieved pain. He is in a wheelchair at home with a Roho cushion. He is able to do his  own transfers. I think he is probably putting far too much pressure on these areas Past medical history is reviewed. He has a history of CHF, severe PAD, hypertension, coronary artery disease status post CABG, COPD Gold stage IV on O2, hypothyroidism and a recent problem with urinary retention he now has a Foley catheter. He has had problems with left leg swelling and adjustment of his diuretics he is followed by Dr. Daneen Schick. The patient has severe PAD. On 06/30/2019 he had an angiogram on the remaining left side he had heavily diseased femoral artery. His superficial femoral artery was flush occluded with runoff in the profunda. He was felt his SFA was likely occluded and reconstitutes the popliteal artery via runoff via the peroneal and posterior tibial artery. As noted he is not in a lot of pain. His last noninvasive study was on 6/20. On the left side this was noncompressible ABI but a TBI of only 0.20 with dampened monophasic waveforms Patient History Information obtained from Patient. Allergies cefdinir (Severity: Severe, Reaction: severe diarrhea), nitrofurantoin (Severity: Moderate, Reaction: hand swelling), Sulfa (Sulfonamide Antibiotics) (Severity: Severe, Reaction: facial/tongue swelling), ciprofloxacin (Severity: Moderate, Reaction: itching, rash), doxycycline (Reaction: "felt terrible"), sertraline (Severity: Moderate, Reaction: jittery), Levaquin (Severity: Moderate, Reaction: itching, rash), amoxicillin (Reaction: frequent urination) Family History Cancer - Mother, Hypertension - Mother,Siblings, Stroke - Siblings, No family history of Diabetes, Heart Disease, Hereditary Spherocytosis, Kidney Disease, Lung Disease, Seizures, Thyroid Problems, Tuberculosis. Social History Former smoker - quit 1984 - ended on 01/09/1982, Marital Status - Married, Alcohol Use - Moderate, Drug Use - No History, Caffeine Use - Daily. Medical History Eyes Patient has history of Cataracts Denies  history  of Glaucoma, Optic Neuritis Ear/Nose/Mouth/Throat Denies history of Chronic sinus problems/congestion, Middle ear problems Hematologic/Lymphatic Denies history of Anemia, Hemophilia, Human Immunodeficiency Virus, Lymphedema, Sickle Cell Disease Respiratory Patient has history of Asthma, Chronic Obstructive Pulmonary Disease (COPD) Denies history of Aspiration, Pneumothorax, Sleep Apnea, Tuberculosis Cardiovascular Patient has history of Congestive Heart Failure, Coronary Artery Disease, Hypertension, Peripheral Arterial Disease Denies history of Angina, Arrhythmia, Deep Vein Thrombosis, Hypotension, Peripheral Venous Disease, Phlebitis, Vasculitis Gastrointestinal Denies history of Cirrhosis , Colitis, Crohnoos, Hepatitis A, Hepatitis B, Hepatitis C Endocrine Denies history of Type I Diabetes, Type II Diabetes Genitourinary Denies history of End Stage Renal Disease Immunological Denies history of Lupus Erythematosus, Raynaudoos, Scleroderma Integumentary (Skin) Denies history of History of Burn Musculoskeletal Patient has history of Osteoarthritis Denies history of Gout, Rheumatoid Arthritis, Osteomyelitis Neurologic Denies history of Dementia, Neuropathy, Quadriplegia, Paraplegia, Seizure Disorder Oncologic Denies history of Received Chemotherapy, Received Radiation Psychiatric Patient has history of Confinement Anxiety Denies history of Anorexia/bulimia Hospitalization/Surgery History - copd. - seizure. - CABG 4 vessels. - right AKA. Medical A Surgical History Notes nd Ear/Nose/Mouth/Throat chronic rhinitis Cardiovascular carotid artery occlusion, hyperlipidemia Gastrointestinal GERD, gallstones Endocrine hypothyroidism Genitourinary BPH, hx kidney stones, ureteral tumor, urinary retention, foley cath Oncologic squamous cell carcinoma of skin Review of Systems (ROS) Constitutional Symptoms (General Health) Complains or has symptoms of Fatigue. Denies  complaints or symptoms of Fever, Chills, Marked Weight Change. Eyes Complains or has symptoms of Glasses / Contacts. Ear/Nose/Mouth/Throat Denies complaints or symptoms of Chronic sinus problems or rhinitis. Respiratory Complains or has symptoms of Shortness of Breath. Denies complaints or symptoms of Chronic or frequent coughs. Cardiovascular Denies complaints or symptoms of Chest pain. Gastrointestinal Denies complaints or symptoms of Frequent diarrhea, Nausea, Vomiting. Endocrine Denies complaints or symptoms of Heat/cold intolerance. Genitourinary Denies complaints or symptoms of Frequent urination. Integumentary (Skin) Complains or has symptoms of Wounds - left heel, coccyx. Musculoskeletal Complains or has symptoms of Muscle Weakness. Denies complaints or symptoms of Muscle Pain. Neurologic Complains or has symptoms of Numbness/parasthesias. Psychiatric Complains or has symptoms of Claustrophobia - mild. Denies complaints or symptoms of Suicidal. Objective Constitutional Sitting or standing Blood Pressure is within target range for patient.. Pulse regular and within target range for patient.Marland Kitchen Respirations regular, non-labored and within target range.. Temperature is normal and within the target range for the patient.Marland Kitchen Appears in no distress. Vitals Time Taken: 3:06 PM, Height: 69 in, Source: Stated, Temperature: 98.7 F, Pulse: 96 bpm, Respiratory Rate: 18 breaths/min, Blood Pressure: 107/72 mmHg. Ears, Nose, Mouth, and Throat Patient is hard of hearing.. Cardiovascular I could not feel a palpable popliteal or inguinal pulse. Pedal pulses are absent. Psychiatric appears at normal baseline. General Notes: Wound exam ooThe patient has 2 small areas. One on the lower sacrum. This is a small but fairly deep wound. Rolled senescent edges. ooOn the lateral part of his left heel nonweightbearing surface there is a small open area as well. Some maceration of the skin around  this ooOn both areas I think there is stage I pressure damage. Integumentary (Hair, Skin) Wound #14 status is Open. Original cause of wound was Pressure Injury. The wound is located on the Left,Lateral Calcaneus. The wound measures 0.3cm length x 0.3cm width x 0.1cm depth; 0.071cm^2 area and 0.007cm^3 volume. There is Fat Layer (Subcutaneous Tissue) exposed. There is no tunneling or undermining noted. There is a small amount of serous drainage noted. The wound margin is flat and intact. There is small (1-33%) pink granulation within the wound bed. There  is a large (67-100%) amount of necrotic tissue within the wound bed including Adherent Slough. Wound #15 status is Open. Original cause of wound was Pressure Injury. The wound is located on the Sacrum. The wound measures 0.7cm length x 0.4cm width x 0.1cm depth; 0.22cm^2 area and 0.022cm^3 volume. There is Fat Layer (Subcutaneous Tissue) exposed. There is no tunneling or undermining noted. There is a small amount of serous drainage noted. The wound margin is epibole. There is small (1-33%) pink granulation within the wound bed. There is a large (67-100%) amount of necrotic tissue within the wound bed including Adherent Slough. Assessment Active Problems ICD-10 Pressure ulcer of sacral region, stage 3 Pressure ulcer of left heel, stage 3 Acquired absence of right leg above knee Procedures Wound #15 Pre-procedure diagnosis of Wound #15 is a Pressure Ulcer located on the Sacrum . There was a Selective/Open Wound Skin/Epidermis Debridement with a total area of 0.28 sq cm performed by Ricard Dillon., MD. With the following instrument(s): Curette to remove Viable and Non-Viable tissue/material. Material removed includes Slough, Skin: Dermis, and Skin: Epidermis after achieving pain control using Lidocaine 5% topical ointment. No specimens were taken. A time out was conducted at 16:31, prior to the start of the procedure. A Moderate amount of  bleeding was controlled with Silver Nitrate. The procedure was tolerated well with a pain level of 0 throughout and a pain level of 0 following the procedure. Post Debridement Measurements: 0.7cm length x 0.4cm width x 0.1cm depth; 0.022cm^3 volume. Post debridement Stage noted as Category/Stage III. Character of Wound/Ulcer Post Debridement is improved. Post procedure Diagnosis Wound #15: Same as Pre-Procedure Plan Follow-up Appointments: Return Appointment in 2 weeks. Dressing Change Frequency: Change Dressing every other day. Wound Cleansing: Wound #14 Left,Lateral Calcaneus: May shower and wash wound with soap and water. Wound #15 Sacrum: May shower and wash wound with soap and water. Primary Wound Dressing: Wound #14 Left,Lateral Calcaneus: Calcium Alginate with Silver Wound #15 Sacrum: Silver Collagen - moisten with normal saline Secondary Dressing: Wound #14 Left,Lateral Calcaneus: Foam Border Wound #15 Sacrum: Foam Border Home Health: Wound #15 Sacrum: [place] [facility] for wound care. - [facility] 1. I am going to use moistened silver collagen to the wound on the sacrum which I think they have already been doing with a border foam cover 2. T the heel silver alginate with again foam cover o 3. We went over pressure relief issues in the clinic with the patient and his wife. He has a Roho cushion in his chair but no substitute for absolute pressure relief either in the chair or in bed at night. She states he is an "back sleeper", this of course can happen 4. The area on his heel according to his wife is been better with the dressings they have been doing. He has severe PAD. I hope this will allow for healing of this area but I am not completely confident. 5. He has edema in the leg which is pitting. I did not fully examine his cardiac status is he just saw his cardiologist last week. He is not a candidate for any form of compression I spent 35 minutes in  review of this patient's past medical history face-to-face evaluation and preparation of this record purely fairly extensive discussion with the patient and his wife Electronic Signature(s) Signed: 11/11/2019 4:57:00 PM By: [name] MD Entered By: [name] on 11/11/2019 16:49:12 -------------------------------------------------------------------------------- HxROS Details Patient Name: Date of Service: DO RN, [name] ID  L. 11/11/2019 2:45 PM Medical Record Number: [id number] Patient Account Number: [id number] Date of Birth/Sex: Treating RN: [birth year] (86 y.o. [name] Primary Care Provider: [name] Other Clinician: Referring Provider: Treating Provider/Extender: [name] in Treatment: 0 Information Obtained From Patient Constitutional Symptoms (General Health) Complaints and Symptoms: Positive for: Fatigue Negative for: Fever; Chills; Marked Weight Change Eyes Complaints and Symptoms: Positive for: Glasses / Contacts Medical History: Positive for: Cataracts Negative for: Glaucoma; Optic Neuritis Ear/Nose/Mouth/Throat Complaints and Symptoms: Negative for: Chronic sinus problems or rhinitis Medical History: Negative for: Chronic sinus problems/congestion; Middle ear problems Past Medical History Notes: chronic rhinitis Respiratory Complaints and Symptoms: Positive for: Shortness of Breath Negative for: Chronic or frequent coughs Medical History: Positive for: Asthma; Chronic Obstructive Pulmonary Disease (COPD) Negative for: Aspiration; Pneumothorax; Sleep Apnea; Tuberculosis Cardiovascular Complaints and Symptoms: Negative for: Chest pain Medical History: Positive for: Congestive Heart Failure; Coronary Artery Disease; Hypertension; Peripheral Arterial Disease Negative for: Angina; Arrhythmia; Deep Vein Thrombosis; Hypotension; Peripheral Venous Disease; Phlebitis; Vasculitis Past Medical History Notes: carotid  artery occlusion, hyperlipidemia Gastrointestinal Complaints and Symptoms: Negative for: Frequent diarrhea; Nausea; Vomiting Medical History: Negative for: Cirrhosis ; Colitis; Crohns; Hepatitis A; Hepatitis B; Hepatitis C Past Medical History Notes: GERD, gallstones Endocrine Complaints and Symptoms: Negative for: Heat/cold intolerance Medical History: Negative for: Type I Diabetes; Type II Diabetes Past Medical History Notes: hypothyroidism Genitourinary Complaints and Symptoms: Negative for: Frequent urination Medical History: Negative for: End Stage Renal Disease Past Medical History Notes: BPH, hx kidney stones, ureteral tumor, urinary retention, foley cath Integumentary (Skin) Complaints and Symptoms: Positive for: Wounds - left heel, coccyx Medical History: Negative for: History of Burn Musculoskeletal Complaints and Symptoms: Positive for: Muscle Weakness Negative for: Muscle Pain Medical History: Positive for: Osteoarthritis Negative for: Gout; Rheumatoid Arthritis; Osteomyelitis Neurologic Complaints and Symptoms: Positive for: Numbness/parasthesias Medical History: Negative for: Dementia; Neuropathy; Quadriplegia; Paraplegia; Seizure Disorder Psychiatric Complaints and Symptoms: Positive for: Claustrophobia - mild Negative for: Suicidal Medical History: Positive for: Confinement Anxiety Negative for: Anorexia/bulimia Hematologic/Lymphatic Medical History: Negative for: Anemia; Hemophilia; Human Immunodeficiency Virus; Lymphedema; Sickle Cell Disease Immunological Medical History: Negative for: Lupus Erythematosus; Raynauds; Scleroderma Oncologic Medical History: Negative for: Received Chemotherapy; Received Radiation Past Medical History Notes: squamous cell carcinoma of skin HBO Extended History Items Eyes: Cataracts Immunizations Pneumococcal Vaccine: Received Pneumococcal Vaccination: Yes Implantable Devices No devices  added Hospitalization / Surgery History Type of Hospitalization/Surgery copd seizure CABG 4 vessels right AKA Family and Social History Cancer: Yes - Mother; Diabetes: No; Heart Disease: No; Hereditary Spherocytosis: No; Hypertension: Yes - Mother,Siblings; Kidney Disease: No; Lung Disease: No; Seizures: No; Stroke: Yes - Siblings; Thyroid Problems: No; Tuberculosis: No; Former smoker - quit 1984 - ended on 01/09/1982; Marital Status - Married; Alcohol Use: Moderate; Drug Use: No History; Caffeine Use: Daily; Financial Concerns: No; Food, Clothing or Shelter Needs: No; Support System Lacking: No; Transportation Concerns: No Engineer, maintenance) Signed: 11/11/2019 4:57:00 PM By: [name] MD Signed: 11/12/2019 5:51:45 PM By: Baruch Gouty RN, BSN Entered By: Baruch Gouty on 11/11/2019 15:13:17 -------------------------------------------------------------------------------- Horace Details Patient Name: Date of Service: DO RN, [name] 11/11/2019 Medical Record Number: [id number] Patient Account Number: [id number] Date of Birth/Sex: Treating RN: 08/04/1929 (86 y.o. [name] Primary Care Provider: [name] Other Clinician: Referring Provider: Treating Provider/Extender: [name] in Treatment: 0 Diagnosis Coding ICD-10 Codes Code Description [id number] Pressure ulcer of sacral region, stage 3 L89.623 Pressure ulcer of left heel, stage 3 Z89.611  Acquired absence of right leg above knee Facility Procedures CPT4 Code: 28638177 Description: 11657 - WOUND CARE VISIT-LEV 3 EST PT Modifier: 25 Quantity: 1 CPT4 Code: [id number] Description: 32919 - DEBRIDE WOUND 1ST 20 SQ CM OR < ICD-10 Diagnosis Description L89.153 Pressure ulcer of sacral region, stage 3 Modifier: Quantity: 1 Physician Procedures : CPT4 Code Description Modifier [id number] 99214 - WC PHYS LEVEL 4 - EST PT 25 ICD-10 Diagnosis Description L89.153 Pressure ulcer of sacral  region, stage 3 L89.623 Pressure ulcer of left heel, stage 3 Quantity: 1 : [id number] 14239 - WC PHYS DEBR WO ANESTH 20 SQ CM ICD-10 Diagnosis Description L89.153 Pressure ulcer of sacral region, stage 3 Quantity: 1 Electronic Signature(s) Signed: 11/11/2019 4:57:00 PM By: [name] MD Signed: 11/12/2019 9:08:43 AM By: [name] RN Entered By: [name] on 11/11/2019 16:50:38

## 2019-11-12 NOTE — Progress Notes (Signed)
[name], [name] ([id number]) Visit Report for 11/11/2019 Allergy List Details Patient Name: Date of Service: DO RN, [place] V ID L. 11/11/2019 2:45 PM Medical Record Number: [id number] Patient Account Number: [id number] Date of Birth/Sex: Treating RN: May 15, 1929 (86 y.o. [name] [name] Care [name]: [name] Other Clinician: Referring [name]: Treating [name]/Extender: [name] in Treatment: 0 Allergies Active Allergies cefdinir Reaction: severe diarrhea Severity: Severe nitrofurantoin Reaction: hand swelling Severity: Moderate Sulfa (Sulfonamide Antibiotics) Reaction: facial/tongue swelling Severity: Severe ciprofloxacin Reaction: itching, rash Severity: Moderate doxycycline Reaction: "felt terrible" sertraline Reaction: jittery Severity: Moderate Levaquin Reaction: itching, rash Severity: Moderate amoxicillin Reaction: frequent urination Allergy Notes Electronic Signature(s) Signed: 11/12/2019 5:51:45 PM By: Baruch Gouty RN, BSN Entered By: Baruch Gouty on 11/11/2019 15:07:48 -------------------------------------------------------------------------------- Arrival Information Details Patient Name: Date of Service: DO RN, Travis V ID L. 11/11/2019 2:45 PM Medical Record Number: [id number] Patient Account Number: [id number] Date of Birth/Sex: Treating RN: Sep 09, 1929 (86 y.o. [name] [name] Care Farha Dano: [name] Other Clinician: Referring [name]: Treating [name]/Extender: [name] in Treatment: 0 Visit Information Patient Arrived: Wheel Chair Arrival Time: 15:04 Accompanied By: spouse Transfer Assistance: None Patient Identification Verified: Yes Secondary Verification Process Completed: Yes Patient Requires Transmission-Based Precautions: No Patient Has Alerts: Yes Patient Alerts: L ABI N/C, TBI = .20 History Since Last Visit Has Dressing in Place as Prescribed:  Yes Pain Present Now: Yes Electronic Signature(s) Signed: 11/12/2019 5:51:45 PM By: Baruch Gouty RN, BSN Entered By: Baruch Gouty on 11/11/2019 15:40:20 -------------------------------------------------------------------------------- Clinic Level of Care Assessment Details Patient Name: Date of Service: DO RN, Travis V ID L. 11/11/2019 2:45 PM Medical Record Number: [id number] Patient Account Number: [id number] Date of Birth/Sex: Treating RN: Mar 29, 1929 (86 y.o. [name] [name] Care Rashaud Ybarbo: [name] Other Clinician: Referring [name]: Treating [name]/Extender: [name] in Treatment: 0 Clinic Level of Care Assessment Items TOOL 1 Quantity Score X- 1 0 Use when EandM and Procedure is performed on INITIAL visit ASSESSMENTS - Nursing Assessment / Reassessment X- 1 20 General Physical Exam (combine w/ comprehensive assessment (listed just below) when performed on new pt. evals) X- 1 25 Comprehensive Assessment (HX, ROS, Risk Assessments, Wounds Hx, etc.) ASSESSMENTS - Wound and Skin Assessment / Reassessment []  - 0 Dermatologic / Skin Assessment (not related to wound area) ASSESSMENTS - Ostomy and/or Continence Assessment and Care []  - 0 Incontinence Assessment and Management []  - 0 Ostomy Care Assessment and Management (repouching, etc.) PROCESS - Coordination of Care X - Simple Patient / Family Education for ongoing care 1 15 []  - 0 Complex (extensive) Patient / Family Education for ongoing care X- 1 10 Staff obtains Programmer, systems, Records, T Results / Process Orders est []  - 0 Staff telephones HHA, Nursing Homes / Clarify orders / etc []  - 0 Routine Transfer to another Facility (non-emergent condition) []  - 0 Routine Hospital Admission (non-emergent condition) X- 1 15 New Admissions / Biomedical engineer / Ordering NPWT Apligraf, etc. , []  - 0 Emergency Hospital Admission (emergent condition) PROCESS - Special Needs []  -  0 Pediatric / Minor Patient Management []  - 0 Isolation Patient Management []  - 0 Hearing / Language / Visual special needs []  - 0 Assessment of Community assistance (transportation, D/C planning, etc.) []  - 0 Additional assistance / Altered mentation []  - 0 Support Surface(s) Assessment (bed, cushion, seat, etc.) INTERVENTIONS - Miscellaneous []  - 0 External ear exam []  - 0 Patient Transfer (multiple staff / Civil Service fast streamer / Similar  devices) []  - 0 Simple Staple / Suture removal (25 or less) []  - 0 Complex Staple / Suture removal (26 or more) []  - 0 Hypo/Hyperglycemic Management (do not check if billed separately) X- 1 15 Ankle / Brachial Index (ABI) - do not check if billed separately Has the patient been seen at the hospital within the last three years: Yes Total Score: 100 Level Of Care: New/Established - Level 3 Electronic Signature(s) Signed: 11/12/2019 9:08:43 AM By: [name] RN Entered By: [name] on 11/11/2019 16:40:29 -------------------------------------------------------------------------------- Lower Extremity Assessment Details Patient Name: Date of Service: DO RN, Travis V ID L. 11/11/2019 2:45 PM Medical Record Number: [id number] Patient Account Number: [id number] Date of Birth/Sex: Treating RN: 04-30-1929 (86 y.o. [name] [name] Care [name]: [name] Other Clinician: Referring [name]: Treating [name]/Extender: [name] in Treatment: 0 Edema Assessment Assessed: [Left: No] [Right: No] Edema: [Left: Ye] [Right: s] Calf Left: Right: Point of Measurement: From Medial Instep 34.4 cm Ankle Left: Right: Point of Measurement: From Medial Instep 25.3 cm Vascular Assessment Pulses: Dorsalis Pedis Palpable: [Left:Yes] Electronic Signature(s) Signed: 11/12/2019 5:51:45 PM By: Baruch Gouty RN, BSN Entered By: Baruch Gouty on 11/11/2019  15:21:03 -------------------------------------------------------------------------------- Multi Wound Chart Details Patient Name: Date of Service: DO RN, Travis V ID L. 11/11/2019 2:45 PM Medical Record Number: [id number] Patient Account Number: [id number] Date of Birth/Sex: Treating RN: [birth year] (86 y.o. [name]) [name] [name] Care Byron Tipping: Other Clinician: [name] Referring Shreshta Medley: Treating Zola Runion/Extender: [name] in Treatment: 0 Vital Signs Height(in): 58 Pulse(bpm): 28 Weight(lbs): Blood Pressure(mmHg): 107/72 Body Mass Index(BMI): Temperature(F): 98.7 Respiratory Rate(breaths/min): 18 Photos: [14:No Photos Left, Lateral Calcaneus] [15:No Photos Sacrum] [N/A:N/A N/A] Wound Location: [14:Pressure Injury] [15:Pressure Injury] [N/A:N/A] Wounding Event: [14:Pressure Ulcer] [15:Pressure Ulcer] [N/A:N/A] [name] Etiology: [14:Cataracts, Asthma, Chronic] [15:Cataracts, Asthma, Chronic] [N/A:N/A] Comorbid History: [14:Obstructive Pulmonary Disease (COPD), Congestive Heart Failure, Coronary Artery Disease, Hypertension, Peripheral Arterial Disease, Osteoarthritis, Confinement Disease, Osteoarthritis, Confinement Anxiety 08/01/2019] [15:Obstructive  Pulmonary Disease (COPD), Congestive Heart Failure, Coronary Artery Disease, Hypertension, Peripheral Arterial Anxiety 08/01/2019] [N/A:N/A] Date Acquired: [14:0] [15:0] [N/A:N/A] Weeks of Treatment: [14:Open] [15:Open] [N/A:N/A] Wound Status: [14:0.3x0.3x0.1] [15:0.7x0.4x0.1] [N/A:N/A] Measurements L x W x D (cm) [14:0.071] [15:0.22] [N/A:N/A] A (cm) : rea [14:0.007] [15:0.022] [N/A:N/A] Volume (cm) : [14:Category/Stage III] [15:Category/Stage III] [N/A:N/A] Classification: [14:Small] [15:Small] [N/A:N/A] Exudate A mount: [14:Serous] [15:Serous] [N/A:N/A] Exudate Type: [14:amber] [15:amber] [N/A:N/A] Exudate Color: [14:Flat and Intact] [15:Epibole] [N/A:N/A] Wound Margin: [14:Small (1-33%)]  [15:Small (1-33%)] [N/A:N/A] Granulation A mount: [14:Pink] [15:Pink] [N/A:N/A] Granulation Quality: [14:Large (67-100%)] [15:Large (67-100%)] [N/A:N/A] Necrotic A mount: [14:Fat Layer (Subcutaneous Tissue): Yes Fat Layer (Subcutaneous Tissue): Yes N/A] Exposed Structures: [14:Fascia: No Tendon: No Muscle: No Joint: No Bone: No Small (1-33%)] [15:Fascia: No Tendon: No Muscle: No Joint: No Bone: No None] [N/A:N/A] Treatment Notes Electronic Signature(s) Signed: 11/11/2019 4:57:00 PM By: [name] MD Signed: 11/12/2019 9:08:43 AM By: [name] RN Entered By: [name] on 11/11/2019 16:38:33 -------------------------------------------------------------------------------- Multi-Disciplinary Care Plan Details Patient Name: Date of Service: DO RN, Travis V ID L. 11/11/2019 2:45 PM Medical Record Number: [id number] Patient Account Number: [id number] Date of Birth/Sex: Treating RN: 1929/11/30 (86 y.o. [name] [name] Care Aryan [name]: [name] Other Clinician: Referring Taher Vannote: Treating Nao Linz/Extender: [name] in Treatment: 0 Active Inactive Wound/Skin Impairment Nursing Diagnoses: Knowledge deficit related to ulceration/compromised skin integrity Goals: Patient/caregiver will verbalize understanding of skin care regimen Date Initiated: 11/11/2019 Target Resolution Date: 12/11/2019 Goal Status: Active Ulcer/skin breakdown will have  a volume reduction of 30% by week 4 Date Initiated: 11/11/2019 Target Resolution Date: 12/11/2019 Goal Status: Active Interventions: Assess patient/caregiver ability to obtain necessary supplies Assess patient/caregiver ability to perform ulcer/skin care regimen upon admission and as needed Assess ulceration(s) every visit Notes: Electronic Signature(s) Signed: 11/12/2019 9:08:43 AM By: [name] RN Entered By: [name] on 11/11/2019  16:25:54 -------------------------------------------------------------------------------- Pain Assessment Details Patient Name: Date of Service: DO RN, Travis V ID L. 11/11/2019 2:45 PM Medical Record Number: [id number] Patient Account Number: [id number] Date of Birth/Sex: Treating RN: 15-Feb-1929 (86 y.o. [name] [name] Care Briel Gallicchio: [name] Other Clinician: Referring Nyasha Rahilly: Treating Amarri Michaelson/Extender: [name] in Treatment: 0 Active Problems Location of Pain Severity and Description of Pain Patient Has Paino Yes Site Locations Pain Location: Generalized Pain With Dressing Change: No Duration of the Pain. Constant / Intermittento Constant Rate the pain. Current Pain Level: 5 Character of Pain Describe the Pain: Aching Pain Management and Medication Current Pain Management: Medication: Yes Notes reports general arthritic pain Electronic Signature(s) Signed: 11/12/2019 5:51:45 PM By: Baruch Gouty RN, BSN Signed: 11/12/2019 5:51:45 PM By: Baruch Gouty RN, BSN Entered By: Baruch Gouty on 11/11/2019 15:36:19 -------------------------------------------------------------------------------- Patient/Caregiver Education Details Patient Name: Date of Service: DO RN, Travis V ID L. 11/2/2021andnbsp2:45 PM Medical Record Number: [id number] Patient Account Number: [id number] Date of Birth/Gender: Treating RN: 1929/05/25 (86 y.o. [name] [name] Care Physician: [name] Other Clinician: Referring Physician: Treating Physician/Extender: [name] in Treatment: 0 Education Assessment Education Provided To: Patient Education Topics Provided Wound/Skin Impairment: Methods: Explain/Verbal Responses: State content correctly Electronic Signature(s) Signed: 11/12/2019 9:08:43 AM By: [name] RN Entered By: [name] on 11/11/2019  16:26:13 -------------------------------------------------------------------------------- Wound Assessment Details Patient Name: Date of Service: DO RN, Travis V ID L. 11/11/2019 2:45 PM Medical Record Number: [id number] Patient Account Number: [id number] Date of Birth/Sex: Treating RN: [birth year] (86 y.o. [name] [name] Care Karenann Mcgrory: [name] Other Clinician: Referring Manasi Dishon: Treating Borna Wessinger/Extender: [name] in Treatment: 0 Wound Status Wound Number: 14 [name] Pressure Ulcer Etiology: Wound Location: Left, Lateral Calcaneus Wound Open Wounding Event: Pressure Injury Status: Date Acquired: 08/01/2019 Comorbid Cataracts, Asthma, Chronic Obstructive Pulmonary Disease Weeks Of Treatment: 0 History: (COPD), Congestive Heart Failure, Coronary Artery Disease, Clustered Wound: No Hypertension, Peripheral Arterial Disease, Osteoarthritis, Confinement Anxiety Wound Measurements Length: (cm) 0.3 Width: (cm) 0.3 Depth: (cm) 0.1 Area: (cm) 0.071 Volume: (cm) 0.007 % Reduction in Area: % Reduction in Volume: Epithelialization: Small (1-33%) Tunneling: No Undermining: No Wound Description Classification: Category/Stage III Wound Margin: Flat and Intact Exudate Amount: Small Exudate Type: Serous Exudate Color: amber Foul Odor After Cleansing: No Slough/Fibrino Yes Wound Bed Granulation Amount: Small (1-33%) Exposed Structure Granulation Quality: Pink Fascia Exposed: No Necrotic Amount: Large (67-100%) Fat Layer (Subcutaneous Tissue) Exposed: Yes Necrotic Quality: Adherent Slough Tendon Exposed: No Muscle Exposed: No Joint Exposed: No Bone Exposed: No Electronic Signature(s) Signed: 11/12/2019 5:51:45 PM By: Baruch Gouty RN, BSN Entered By: Baruch Gouty on 11/11/2019 15:33:48 -------------------------------------------------------------------------------- Wound Assessment Details Patient Name: Date of Service: DO  RN, Travis V ID L. 11/11/2019 2:45 PM Medical Record Number: [id number] Patient Account Number: [id number] Date of Birth/Sex: Treating RN: 1929-06-18 (86 y.o. [name] [name] Care Jadore Mcguffin: [name] Other Clinician: Referring Harsimran Westman: Treating Samaria Anes/Extender: [name] in Treatment: 0 Wound Status Wound Number: 15 [name] Pressure Ulcer Etiology: Wound Location: Sacrum Wound Open Wounding Event: Pressure Injury Status: Date Acquired: 08/01/2019 Comorbid  Cataracts, Asthma, Chronic Obstructive Pulmonary Disease Weeks Of Treatment: 0 History: (COPD), Congestive Heart Failure, Coronary Artery Disease, Clustered Wound: No Hypertension, Peripheral Arterial Disease, Osteoarthritis, Confinement Anxiety Wound Measurements Length: (cm) 0.7 Width: (cm) 0.4 Depth: (cm) 0.1 Area: (cm) 0.22 Volume: (cm) 0.022 % Reduction in Area: % Reduction in Volume: Epithelialization: None Tunneling: No Undermining: No Wound Description Classification: Category/Stage III Wound Margin: Epibole Exudate Amount: Small Exudate Type: Serous Exudate Color: amber Foul Odor After Cleansing: No Slough/Fibrino Yes Wound Bed Granulation Amount: Small (1-33%) Exposed Structure Granulation Quality: Pink Fascia Exposed: No Necrotic Amount: Large (67-100%) Fat Layer (Subcutaneous Tissue) Exposed: Yes Necrotic Quality: Adherent Slough Tendon Exposed: No Muscle Exposed: No Joint Exposed: No Bone Exposed: No Electronic Signature(s) Signed: 11/12/2019 5:51:45 PM By: Baruch Gouty RN, BSN Entered By: Baruch Gouty on 11/11/2019 15:35:31 -------------------------------------------------------------------------------- [place] Details Patient Name: Date of Service: DO RN, Travis V ID L. 11/11/2019 2:45 PM Medical Record Number: [id number] Patient Account Number: [id number] Date of Birth/Sex: Treating RN: August 13, 1929 (86 y.o. [name] [name] Care Nyeshia Mysliwiec:  [name] Other Clinician: Referring Avion Kutzer: Treating Lelania Bia/Extender: [name] in Treatment: 0 Vital Signs Time Taken: 15:06 Temperature (F): 98.7 Height (in): 69 Pulse (bpm): 96 Source: Stated Respiratory Rate (breaths/min): 18 Blood Pressure (mmHg): 107/72 Reference Range: 80 - 120 mg / dl Electronic Signature(s) Signed: 11/12/2019 5:51:45 PM By: Baruch Gouty RN, BSN Entered By: Baruch Gouty on 11/11/2019 15:07:38

## 2019-11-13 DIAGNOSIS — L89622 Pressure ulcer of left heel, stage 2: Secondary | ICD-10-CM | POA: Diagnosis not present

## 2019-11-13 DIAGNOSIS — L89152 Pressure ulcer of sacral region, stage 2: Secondary | ICD-10-CM | POA: Diagnosis not present

## 2019-11-13 DIAGNOSIS — I5032 Chronic diastolic (congestive) heart failure: Secondary | ICD-10-CM | POA: Diagnosis not present

## 2019-11-13 DIAGNOSIS — I110 Hypertensive heart disease with heart failure: Secondary | ICD-10-CM | POA: Diagnosis not present

## 2019-11-13 DIAGNOSIS — I739 Peripheral vascular disease, unspecified: Secondary | ICD-10-CM | POA: Diagnosis not present

## 2019-11-13 DIAGNOSIS — I480 Paroxysmal atrial fibrillation: Secondary | ICD-10-CM | POA: Diagnosis not present

## 2019-11-14 DIAGNOSIS — I739 Peripheral vascular disease, unspecified: Secondary | ICD-10-CM | POA: Diagnosis not present

## 2019-11-14 DIAGNOSIS — I5032 Chronic diastolic (congestive) heart failure: Secondary | ICD-10-CM | POA: Diagnosis not present

## 2019-11-14 DIAGNOSIS — L89152 Pressure ulcer of sacral region, stage 2: Secondary | ICD-10-CM | POA: Diagnosis not present

## 2019-11-14 DIAGNOSIS — I110 Hypertensive heart disease with heart failure: Secondary | ICD-10-CM | POA: Diagnosis not present

## 2019-11-14 DIAGNOSIS — L89622 Pressure ulcer of left heel, stage 2: Secondary | ICD-10-CM | POA: Diagnosis not present

## 2019-11-14 DIAGNOSIS — I480 Paroxysmal atrial fibrillation: Secondary | ICD-10-CM | POA: Diagnosis not present

## 2019-11-17 ENCOUNTER — Telehealth: Payer: Self-pay

## 2019-11-17 DIAGNOSIS — L89152 Pressure ulcer of sacral region, stage 2: Secondary | ICD-10-CM | POA: Diagnosis not present

## 2019-11-17 DIAGNOSIS — I5032 Chronic diastolic (congestive) heart failure: Secondary | ICD-10-CM | POA: Diagnosis not present

## 2019-11-17 DIAGNOSIS — I739 Peripheral vascular disease, unspecified: Secondary | ICD-10-CM | POA: Diagnosis not present

## 2019-11-17 DIAGNOSIS — I480 Paroxysmal atrial fibrillation: Secondary | ICD-10-CM | POA: Diagnosis not present

## 2019-11-17 DIAGNOSIS — L89622 Pressure ulcer of left heel, stage 2: Secondary | ICD-10-CM | POA: Diagnosis not present

## 2019-11-17 DIAGNOSIS — I110 Hypertensive heart disease with heart failure: Secondary | ICD-10-CM | POA: Diagnosis not present

## 2019-11-17 NOTE — Telephone Encounter (Signed)
Pt called c/o increased pain in right stump and left leg. Right stump slightly swollen and left lower leg swollen. Posey Pronto put patient on gabapentin, patient states, "it's not helping much." He is continuing to massage his leg, elevate it and do the compression wraps and is seen by [facility]. Discussed with PA and will put him on for Friday to see PA and have ABIs done. Patient verbalizes understanding.

## 2019-11-18 DIAGNOSIS — I739 Peripheral vascular disease, unspecified: Secondary | ICD-10-CM | POA: Diagnosis not present

## 2019-11-18 DIAGNOSIS — Z23 Encounter for immunization: Secondary | ICD-10-CM | POA: Diagnosis not present

## 2019-11-18 DIAGNOSIS — L89152 Pressure ulcer of sacral region, stage 2: Secondary | ICD-10-CM | POA: Diagnosis not present

## 2019-11-18 DIAGNOSIS — L89622 Pressure ulcer of left heel, stage 2: Secondary | ICD-10-CM | POA: Diagnosis not present

## 2019-11-18 DIAGNOSIS — I480 Paroxysmal atrial fibrillation: Secondary | ICD-10-CM | POA: Diagnosis not present

## 2019-11-18 DIAGNOSIS — I5032 Chronic diastolic (congestive) heart failure: Secondary | ICD-10-CM | POA: Diagnosis not present

## 2019-11-18 DIAGNOSIS — I110 Hypertensive heart disease with heart failure: Secondary | ICD-10-CM | POA: Diagnosis not present

## 2019-11-19 ENCOUNTER — Other Ambulatory Visit: Payer: Self-pay

## 2019-11-19 DIAGNOSIS — I110 Hypertensive heart disease with heart failure: Secondary | ICD-10-CM | POA: Diagnosis not present

## 2019-11-19 DIAGNOSIS — L89622 Pressure ulcer of left heel, stage 2: Secondary | ICD-10-CM | POA: Diagnosis not present

## 2019-11-19 DIAGNOSIS — I5032 Chronic diastolic (congestive) heart failure: Secondary | ICD-10-CM | POA: Diagnosis not present

## 2019-11-19 DIAGNOSIS — I739 Peripheral vascular disease, unspecified: Secondary | ICD-10-CM

## 2019-11-19 DIAGNOSIS — I480 Paroxysmal atrial fibrillation: Secondary | ICD-10-CM | POA: Diagnosis not present

## 2019-11-19 DIAGNOSIS — L89152 Pressure ulcer of sacral region, stage 2: Secondary | ICD-10-CM | POA: Diagnosis not present

## 2019-11-19 NOTE — Addendum Note (Signed)
Addended byDoylene Bode on: 11/19/2019 03:50 PM   Modules accepted: Orders

## 2019-11-20 ENCOUNTER — Other Ambulatory Visit: Payer: Self-pay | Admitting: *Deleted

## 2019-11-20 ENCOUNTER — Encounter: Payer: Medicare Other | Attending: Physical Medicine & Rehabilitation | Admitting: Physical Medicine & Rehabilitation

## 2019-11-20 ENCOUNTER — Other Ambulatory Visit: Payer: Self-pay

## 2019-11-20 ENCOUNTER — Encounter: Payer: Self-pay | Admitting: *Deleted

## 2019-11-20 ENCOUNTER — Encounter: Payer: Self-pay | Admitting: Physical Medicine & Rehabilitation

## 2019-11-20 VITALS — BP 132/69 | HR 95 | Ht 68.0 in | Wt 134.0 lb

## 2019-11-20 DIAGNOSIS — Z9981 Dependence on supplemental oxygen: Secondary | ICD-10-CM | POA: Insufficient documentation

## 2019-11-20 DIAGNOSIS — G8918 Other acute postprocedural pain: Secondary | ICD-10-CM | POA: Insufficient documentation

## 2019-11-20 DIAGNOSIS — R269 Unspecified abnormalities of gait and mobility: Secondary | ICD-10-CM | POA: Diagnosis not present

## 2019-11-20 DIAGNOSIS — Z89611 Acquired absence of right leg above knee: Secondary | ICD-10-CM

## 2019-11-20 DIAGNOSIS — I779 Disorder of arteries and arterioles, unspecified: Secondary | ICD-10-CM | POA: Diagnosis not present

## 2019-11-20 DIAGNOSIS — I5022 Chronic systolic (congestive) heart failure: Secondary | ICD-10-CM | POA: Insufficient documentation

## 2019-11-20 MED ORDER — PREGABALIN 50 MG PO CAPS: 50.0000 mg | ORAL_CAPSULE | Freq: Two times a day (BID) | ORAL | 1 refills | Status: DC

## 2019-11-20 NOTE — Patient Outreach (Signed)
[facility]) Care Management  11/20/2019  [name] [birth year] [id number]   CSW was able to make initial contact with patient's wife, [name] today, to perform the phone assessment on patient, as well as to assess and assist with social work needs and services.  CSW introduced self, explained role and types of services provided through [facility] ([employer]).  CSW further explained to [name] that CSW works with patient's RNCM, also with [facility] [employer], Valente Derron.  CSW then explained the reason for the call, indicating that [name] thought that patient would benefit from social work services and resources to assist with arranging transportation to and from his physician appointments.  CSW obtained two HIPAA compliant identifiers from [name], which included patient's name and date of birth.  [name] admitted that she is currently able to transport patient to and from his physician appointments, but is requesting back-up resources, for future reference.  CSW spoke with [name] at [employer] about transportation services offered through the following agencies: Armed forces technical officer through [employer] [place], [employer] Patient [employer], and [employer] through the [employer].  CSW obtained verbal consent from [name] to place a referral, to all three of these agencies, through the [employer].  [name] is aware that she will be receiving direct calls from each of these agencies, offering to provide transportation assistance to patient.  CSW agreed to follow-up with [name] again in two weeks, on Monday, December 08, 2019, around 10:00 AM.  [name], BSW, MSW, [place]  Licensed Clinical Social Worker  [place]  [employer] [place]. [address] [place], [place] [address] Physical  Address-[address] [place], [place] [address] Toll Free Main # [phone] Fax # [phone] Cell # [phone]  [name]

## 2019-11-20 NOTE — Patient Outreach (Signed)
[facility]) Care Management  11/20/2019  [name] 22-Feb-1929 [id number]   Call placed to member's wife, she state member is "doing poorly."  State he has periods where he is better then he has another decline.  She continues to acknowledge several physicians recommending hospice, still feel they aren't ready.  [facility] remains involved for PT and OT.  Office visits scheduled with rehab physician today, with wound care office on 11/16, and with vascular surgeon on 11/18.  State it would help if they had help with transportation services.  Otherwise declines other concerns.  This care manager will follow up with member/wife within the next month, will place referral to [facility].  Goals Addressed            This Visit's Progress   . [facility] - Learn More About My Health   On track    Follow Up Date 12/14   - make a list of questions - ask questions - speak up when I don't understand    Why is this important?   The best way to learn about your health and care is by talking to the doctor and nurse.  They will answer your questions and give you information in the way that you like best.    Notes:  Will report understanding of progression and recovery from amputation     . [facility] - Make and Keep All Appointments   On track    Follow Up Date 12/11   - call to cancel if needed - keep a calendar with appointment dates    Why is this important?   Part of staying healthy is seeing the doctor for follow-up care.  If you forget your appointments, there are some things you can do to stay on track.    Notes:   11/11 - Referral to CSW for transportation resources      Valente Colbert, [place], MSN [place] Manager [phone]

## 2019-11-20 NOTE — Progress Notes (Signed)
Subjective:    Patient ID: [name], male    DOB: June 14, 1929, 86 y.o.   MRN: [id number]  HPI Male with history of COPD with chronic hypoxic respiratory failure and oxygen dependent, BPH--Foley in place for retention, PAF, CAD, PVD presents for follow up for right AKA with multiple medical problems.  Wife provides history. Last clinic visit 10/20/19.  Wife supplements history. Since that time, pt states he is in therapies.  Overall doing well. He notes lethargy with Gabapentin. She continues to use supplemental oxygen. He has increased Lasix due to increase edema. He still has a foley. BP is controlled. He has not been checking his weights due to inability to stand.  Denies falls. Using wheelchair at all times.    Pain Inventory Average Pain 5 Pain Right Now 5 My pain is constant, sharp, burning, dull, stabbing, tingling and aching  In the last 24 hours, has pain interfered with the following? General activity 4 Relation with others 4 Enjoyment of life 6 What TIME of day is your pain at its worst? morning  Sleep (in general) Poor  Pain is worse with: sitting and some activites Pain improves with: medication and Tylenol Relief from Meds: 4  use a wheelchair transfers alone Do you have any goals in this area?  yes  not employed: date last employed 1989 I need assistance with the following:  dressing, bathing, meal prep, household duties and shopping Do you have any goals in this area?  yes  bladder control problems weakness numbness trouble walking depression anxiety  New Patient  New Patient    Family History  Problem Relation Age of Onset  . Heart disease Mother   . Heart disease Father        Before age 60  . Breast cancer Mother   . Cancer Mother        Breast cancer  . Hypertension Mother   . Other Mother        AAA  and   Amputation  . Heart attack Father   . Stroke Brother        x3, still living   . Peripheral vascular disease Brother   . Heart  disease Brother   . Hyperlipidemia Brother   . Hypertension Brother   . Heart disease Brother   . Allergies Brother    Social History   Socioeconomic History  . Marital status: Married    Spouse name: Not on file  . Number of children: 2  . Years of education: Not on file  . Highest education level: Not on file  Occupational History  . 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 Smoking status: Former Smoker    Packs/day: 2.00    Years: 33.00    Pack years: 66.00    Types: Cigarettes    Quit date: 03/25/1982    Years since quitting: 37.6  . Smokeless tobacco: Never Used  Vaping Use  . Vaping Use: Never used  Substance and Sexual Activity  . Alcohol use: Yes    Alcohol/week: 0.0 standard drinks    Comment: 1.5-3 oz daily in the past, minimal use in the last few months  . Drug use: No  . Sexual activity: Not on file  Other Topics Concern  . Not on file  Social History Narrative   ** Merged History Encounter **       Pt lives at home with his spouse.  Social Determinants of Health   Financial Resource Strain:   . Difficulty of Paying Living Expenses: Not on file  Food Insecurity: No Food Insecurity  . Worried About Charity fundraiser in the Last Year: Never true  . Ran Out of Food in the Last Year: Never true  Transportation Needs: No Transportation Needs  . Lack of Transportation (Medical): No  . Lack of Transportation (Non-Medical): No  Physical Activity:   . Days of Exercise per Week: Not on file  . Minutes of Exercise per Session: Not on file  Stress:   . Feeling of Stress : Not on file  Social Connections:   . Frequency of Communication with Friends and Family: Not on file  . Frequency of Social Gatherings with Friends and Family: Not on file  . Attends Religious Services: Not on file  . Active Member of Clubs or Organizations: Not on file  . Attends Archivist Meetings: Not on file  . Marital  Status: Not on file   Past Surgical History:  Procedure Laterality Date  . ABDOMINAL AORTOGRAM W/LOWER EXTREMITY Bilateral 06/30/2019   Procedure: ABDOMINAL AORTOGRAM W/LOWER EXTREMITY;  Surgeon: Waynetta Sandy, MD;  Location: [facility];  Service: Cardiovascular;  Laterality: Bilateral;  . AMPUTATION Right 08/11/2019   Procedure: RIGHT ABOVE KNEE AMPUTATION;  Surgeon: Angelia Mould, MD;  Location: [facility];  Service: Vascular;  Laterality: Right;  . BASAL CELL CARCINOMA EXCISION Left    ear  . CARDIAC CATHETERIZATION     "prior to bypass"  . CATARACT EXTRACTION W/ INTRAOCULAR LENS  IMPLANT, BILATERAL Bilateral   . CORONARY ARTERY BYPASS GRAFT  10/2001   LIMA to LAD, SVG to OM1-2, SVG to RCA and PDA  . LITHOTRIPSY    . PROSTATE BIOPSY  Oct. 2014  . TONSILLECTOMY     Past Medical History:  Diagnosis Date  . Anxiety   . Arthritis    "generalized" (04/04/2017)  . Asthma   . Basal cell carcinoma    "left ear"  . BPH (benign prostatic hyperplasia)    severe; s/p multiple biopsies  . CAD (coronary artery disease)   . Carotid artery occlusion   . Chronic respiratory failure ([place])   . Chronic rhinitis   . COPD (chronic obstructive pulmonary disease) ([facility])    2L [place] O2  . Diastolic heart failure ([place]) 2019  . Dilation of biliary tract   . Elevated troponin 03/09/2017  . Gallstones   . GERD (gastroesophageal reflux disease)   . History of blood transfusion    "w/his CABG" (04/04/2017)  . History of kidney stones   . Hyperlipidemia   . Hypertension   . On home oxygen therapy    "~ 24/7" (04/04/2017)  . Peripheral vascular disease ([place])   . Pneumonia 2019  . Squamous cell carcinoma of skin 04/23/2013   in situ-Right flank (txpbx)  . Squamous cell carcinoma of skin 01/23/2017   [place]/ in siut- Left ear rim (CX3FU)  . Syncope and collapse   . Ureteral tumor 08/2015   had endoscopic procedure for evaluation, unable to reach for biopsy  . Urinary catheter  (Foley) change required    BP 132/69   Pulse 95   Ht 5\' 8"  (1.727 m)   Wt 134 lb (60.8 kg)   SpO2 96%   BMI 20.37 kg/m   Opioid Risk Score:   Fall Risk Score:  `1  Depression screen PHQ 2/9  Depression screen [facility] 2/9 10/20/2019  Decreased Interest 2  Down, Depressed, Hopeless 2  PHQ - 2 Score 4  Altered sleeping 3  Tired, decreased energy 3  Change in appetite 0  Feeling bad or failure about yourself  1  Trouble concentrating 1  Moving slowly or fidgety/restless 2  Suicidal thoughts 0  PHQ-9 Score 14  Some recent data might be hidden   Review of Systems  Constitutional: Negative.   HENT: Positive for congestion, postnasal drip, sinus pressure and sinus pain.        Ear blockage   Eyes: Negative.   Respiratory: Positive for shortness of breath.        Copd  Cardiovascular: Positive for leg swelling.  Endocrine: Negative.   Genitourinary: Positive for difficulty urinating.       Foley cath. Urine retention  Musculoskeletal: Positive for arthralgias, gait problem and neck pain.       Stump pain Left leg numbness Right foot pain  Skin: Positive for wound.  Neurological: Positive for dizziness, tremors and weakness.       Vertigo  Hematological: Bruises/bleeds easily.  All other systems reviewed and are negative.     Objective:   Physical Exam  Constitutional: No distress . Vital signs reviewed. Frail. HENT: Normocephalic.  Atraumatic. Eyes: EOMI. No discharge. Cardiovascular: No JVD.   Respiratory: Normal effort.  No stridor.  +[place].  GI: Non-distended.   Skin: Warm and dry.  Right AKA healing Psych: Normal mood.  Normal behavior. Musc: Right AKA with edema and tenderness LLE edema  Neuro: Alert Extreme HOH Motor: Bilateral upper extremities: 4+/5 proximal distally Left lower extremity: 4/5 proximal distal Right lower extremity: Hip flexion, knee extension 4+/5 (some pain inhibition)    Assessment & Plan:  Male with history of COPD with chronic hypoxic  respiratory failure and oxygen dependent, BPH--Foley in place for retention, PAF, CAD, PVD presents for follow up for right AKA with multiple medical problems.  1.  Impaired mobility and ADLs secondary to right AKA with significant medical comorbidities             Continue therapies  Released by Vascular  Encouraged stump shrinker   2. Phantom limb therapies  Unable to tolerate Gabapentin 100 BID due to lethargy, d/ced - may consider resuming qhs for sleep as well  Will order Lyrica 50 BID  3. COPD/chronic hypoxic respiratory failure:   Continue supplemental oxygen  4. BPH/renal calculi:              Continue foley, unable to wean              Continue finasteride.   5. CAD/Chronic systolic CHF:   Cont meds  Unable to check weights  6. Gait abnormality  Continue wheelchair for safety.  Continue therapies.

## 2019-11-21 DIAGNOSIS — I110 Hypertensive heart disease with heart failure: Secondary | ICD-10-CM | POA: Diagnosis not present

## 2019-11-21 DIAGNOSIS — I480 Paroxysmal atrial fibrillation: Secondary | ICD-10-CM | POA: Diagnosis not present

## 2019-11-21 DIAGNOSIS — L89622 Pressure ulcer of left heel, stage 2: Secondary | ICD-10-CM | POA: Diagnosis not present

## 2019-11-21 DIAGNOSIS — I739 Peripheral vascular disease, unspecified: Secondary | ICD-10-CM | POA: Diagnosis not present

## 2019-11-21 DIAGNOSIS — L89152 Pressure ulcer of sacral region, stage 2: Secondary | ICD-10-CM | POA: Diagnosis not present

## 2019-11-21 DIAGNOSIS — I5032 Chronic diastolic (congestive) heart failure: Secondary | ICD-10-CM | POA: Diagnosis not present

## 2019-11-24 DIAGNOSIS — I110 Hypertensive heart disease with heart failure: Secondary | ICD-10-CM | POA: Diagnosis not present

## 2019-11-24 DIAGNOSIS — L89622 Pressure ulcer of left heel, stage 2: Secondary | ICD-10-CM | POA: Diagnosis not present

## 2019-11-24 DIAGNOSIS — I480 Paroxysmal atrial fibrillation: Secondary | ICD-10-CM | POA: Diagnosis not present

## 2019-11-24 DIAGNOSIS — L89152 Pressure ulcer of sacral region, stage 2: Secondary | ICD-10-CM | POA: Diagnosis not present

## 2019-11-24 DIAGNOSIS — I5032 Chronic diastolic (congestive) heart failure: Secondary | ICD-10-CM | POA: Diagnosis not present

## 2019-11-24 DIAGNOSIS — I739 Peripheral vascular disease, unspecified: Secondary | ICD-10-CM | POA: Diagnosis not present

## 2019-11-25 ENCOUNTER — Other Ambulatory Visit: Payer: Self-pay

## 2019-11-25 ENCOUNTER — Encounter (HOSPITAL_BASED_OUTPATIENT_CLINIC_OR_DEPARTMENT_OTHER): Payer: Medicare Other | Attending: Internal Medicine | Admitting: Internal Medicine

## 2019-11-25 DIAGNOSIS — I2510 Atherosclerotic heart disease of native coronary artery without angina pectoris: Secondary | ICD-10-CM | POA: Diagnosis not present

## 2019-11-25 DIAGNOSIS — I739 Peripheral vascular disease, unspecified: Secondary | ICD-10-CM | POA: Diagnosis not present

## 2019-11-25 DIAGNOSIS — I110 Hypertensive heart disease with heart failure: Secondary | ICD-10-CM | POA: Diagnosis not present

## 2019-11-25 DIAGNOSIS — I509 Heart failure, unspecified: Secondary | ICD-10-CM | POA: Diagnosis not present

## 2019-11-25 DIAGNOSIS — L89153 Pressure ulcer of sacral region, stage 3: Secondary | ICD-10-CM | POA: Diagnosis not present

## 2019-11-25 DIAGNOSIS — L89623 Pressure ulcer of left heel, stage 3: Secondary | ICD-10-CM | POA: Diagnosis not present

## 2019-11-25 NOTE — Progress Notes (Signed)
[name], [name] ([id number]) Visit Report for 11/25/2019 Debridement Details Patient Name: Date of Service: DO RN, [name] 11/25/2019 2:00 PM Medical Record Number: [id number] Patient Account Number: [id number] Date of Birth/Sex: Treating RN: Feb 20, 1929 (86 y.o. [name]) [name] Primary Care Provider: [name] Other Clinician: Referring Provider: Treating Provider/Extender: [name] in Treatment: 2 Debridement Performed for Assessment: Wound #14 Left,Lateral Calcaneus Performed By: Physician Ricard Dillon., MD Debridement Type: Debridement Level of Consciousness (Pre-procedure): Awake and Alert Pre-procedure Verification/Time Out Yes - 15:07 Taken: Start Time: 15:07 Pain Control: Lidocaine 5% topical ointment T Area Debrided (L x W): otal 0.2 (cm) x 0.2 (cm) = 0.04 (cm) Tissue and other material debrided: Viable, Non-Viable, Slough, Skin: Dermis , Skin: Epidermis, Slough Level: Skin/Epidermis Debridement Description: Selective/Open Wound Instrument: Curette Bleeding: Minimum Hemostasis Achieved: Pressure End Time: 15:09 Procedural Pain: 0 Post Procedural Pain: 0 Response to Treatment: Procedure was tolerated well Level of Consciousness (Post- Awake and Alert procedure): Post Debridement Measurements of Total Wound Length: (cm) 0.2 Stage: Category/Stage III Width: (cm) 0.2 Depth: (cm) 0.1 Volume: (cm) 0.003 Character of Wound/Ulcer Post Debridement: Improved Post Procedure Diagnosis Same as Pre-procedure Electronic Signature(s) Signed: 11/25/2019 4:54:15 PM By: [name] MD Signed: 11/25/2019 5:00:30 PM By: [name] RN Entered By: [name] on 11/25/2019 16:05:02 -------------------------------------------------------------------------------- HPI Details Patient Name: Date of Service: DO RN, [name] 11/25/2019 2:00 PM Medical Record Number: [id number] Patient Account Number: [id number] Date of Birth/Sex:  Treating RN: 1929/02/28 (86 y.o. [name] Primary Care Provider: [name] Other Clinician: Referring Provider: Treating Provider/Extender: [name] in Treatment: 2 History of Present Illness HPI Description: 01/30/18 on evaluation today patient presents for initial inspection concerning the skin tear of the right forearm. Fortunately there does not appear to be any evidence of infection and this occurred approximately three days ago. He has been using Vaseline over the region. With that being said the patient does have a history of cataracts COPD, hypertension, peripheral arterial disease, and osteoarthritis. He tells me that he does have a little bit of pain at the site but nothing too significant at this time which is good news. Fortunately this overall appears to be doing fairly well which is good news. No fevers, chills, nausea, or vomiting noted at this time. The biggest issue at this point is that the wound is of quite significant size. READMISSION 04/21/2019 This is a 86 year old man with multiple medical problems who was seen once in the clinic here in January 2020 with a skin tear on his right forearm seen by [name]. This problem this time started sometime recently. He was noted by podiatry to have erosions and wounds of his right foot on toes 1-3. He also saw [name] of dermatology who gave him Silvadene cream for what I think was felt to be a stasis dermatitis of his bilateral lower legs. At some point he was referred to [name]. He was also noted to have previous noninvasive arterial studies that showed no flow to either one of his toes and noncompressible ABIs bilaterally. [name] did noninvasive arterial studies on him. These showed that he had greater than 75% stenosis in the proximal and mid common femoral artery. Monophasic flow in the posterior tibial and dorsalis pedis positions on the right foot. ABIs again were noncompressible.  He was felt to have total occlusion of the superficial femoral artery on the right. The overall interpretation is that  he had severe multilevel arterial occlusive disease. Absent femoral pulses and superficial femoral artery occlusions. In spite of this he was not felt to be candidate for arteriography given his bilateral femoral artery occlusions as well he would not be a candidate for endovascular approach secondary to his common femoral artery occlusions and proximal iliac disease. He was not felt to be a candidate for an open infra inguinal bypass. He therefore felt he would need to be monitored over time with the only option being a right below-knee amputation The patient complains currently of pain at night when he is up in bed. The pain in the right foot is better when he puts the leg down over the bed side. This is compatible with rest pain. He has very limited activity i.e. is even exhausted getting dressed in the morning because of cardiopulmonary issues therefore it is difficult to gauge claudication I believe he was referred here by Dr. [name] with one of the major issues is whether he would be a candidate for hyperbaric oxygen. He is not a diabetic. Dr. [name] did give him Silvadene cream for dry flaking skin in his bilateral lower extremities, however the patient is concerned about using this in the face of sulfa allergy. 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 This is on the medial aspect extended there is a second area on the plantar aspect. Most of what the patient talks about is claudication with minimal activity [however the patient is limited by his  COPD], or even at rest at night. He is not getting a lot of sleep. We use silver collagen on this wound 5/4; the patient has a wound on the tip of his right great toe also on the medial aspect. Setting of severe nonrevascularizable PAD [see description in HPI]. He does not describe any change in his pain. We have been using silver collagen 5/18; 2-week follow-up. The patient has wound on the tip of his right great toe as well as the medial aspect. He has developed new wounds x2 on the right lateral foot and the right lateral calcaneus. These look like ischemic areas. The patient talks about claudication at night. He seems to be more comfortable during the day. We have been using silver collagen to the wounds. I have previously debrided the eschar on the toes however this comes right back. 6/1; 2-week follow-up. This patient has ischemic wounds on his right foot in the setting of nonunreconstructable PAD [previously reviewed by Dr. Dixon]. He does not really describe severe pain but he does have episodic pain in the right great toe. He has an area on the tip of the right great toe as well as the medial aspect of the right great toe. A new area on the tip of the right fourth toe. He has the area on the right lateral foot fortunately the area on the right lateral heel appears to have epithelialized over. We are using silver collagen to all wounds 06/23/2019 upon evaluation today patient appears to be doing about the same in regard to his foot ulcers. Unfortunately he has 2 new skin tears on his left elbow and the right forearm that were new as of today. He states  that it was doing okay until they went to change the dressing and they put some collagen on it that got stuck and somewhat pulled back the skin unfortunately. Fortunately there is no signs of active infection at this time. No fevers, chills, nausea, vomiting, or diarrhea. 6/29; the patient was hospitalized from 6/18 through 6/23 going in with  increasing drainage from the wounds on his right foot. He underwent a angiogram on 06/30/2019 by Dr. Donzetta Matters which revealed multilevel arterial disease including occlusion of the right common femoral artery. He reconstitutes the profunda femoris and the below-knee popliteal artery and appears to have posterior tibial peroneal runoff to the ankle although there is heavy calcification of the popliteal below the knee. He was offered consideration of a right above-knee amputation, palliative and/or hospice care and consideration of a very high risk attempt at bypass. He did receive IV antibiotics. He comes in the clinic today again with the 2 areas that are necrotic on the left great toe and a small punched out area on the right lateral foot. A lot of the erythema on top of the right foot but no warmth. He has a lot of swelling here. The patient is literally tortured over this decision that he needs to make. I went over this with him in exceptional detail. I do not believe he is a candidate for any form of open bypass surgery. I think his vascular doctors would agree with that. He still has pain at night when he puts his leg up on the bed that is relieved by putting his leg dependent. This is not different from what I remember. He is not septic there is no additional skin breakdown that I can see 7/8 the patient continues to have severe pain in the right foot at night making it difficult for him to rest. There is increasing erythema in the right foot but the foot is cold. I do not think this is an infection I think this is likely to be tissue necrosis from ischemia. I thought he would have made an up decision today about going forward with hospice he has not. He did not go to see [name] on 7/6. Again he wants to talk to me at length about amputations. I do not think he will do well after an above-knee amputation on the right. Furthermore I do not think he is a candidate for bypass surgery on the right  leg 7/20; patient still having a lot of pain in the right foot. He saw Dr. Donzetta Matters on 7/16. He is not felt to be able to undergo a complex revascularization and I certainly agree with that. He was offered a right below-knee amputation but he has not made that decision. I have repetitively suggested hospice care if he will not undergo an amputation he is not decided he wants to go that route either. He arrives in the clinic today accompanied by his son who is a Stage manager and his wife. I think the patient has tissue breakdown in the dorsal foot secondary to ischemia although his son brought up that some of the erythema could be a contact dermatitis as they were apparently layering a large area of pure call over the wound area. I was not aware of this not supposed to be doing it in this fashion. In any case the erythema look better than last week and I am quite convinced this is not cellulitis READMISSION 11/11/2019 Mr. Paula Libra is now an 86 year old man who we had in this  clinic from [facility] through July/21 largely as a result of ischemic wounds in his right foot. He was followed by vein and vascular [name]. His anatomy was not felt to be amenable to a revascularization. The patient had increasing ischemic pain but was very resistant to the notion of an amputation. Eventually he did go through with this on August 22 he will underwent an AKA by [name]. He went to rehabilitation. There he developed a small stage III lower sacral pressure ulcer and an area on his left lateral heel which is also probably a pressure ulcer. They have been using silver collagen on both wound areas. The patient states he is not in any unrelieved pain. He is in a wheelchair at home with a Roho cushion. He is able to do his own transfers. I think he is probably putting far too much pressure on these areas Past medical history is reviewed. He has a history of CHF, severe PAD, hypertension, coronary artery disease status post  CABG, COPD Gold stage IV on O2, hypothyroidism and a recent problem with urinary retention he now has a Foley catheter. He has had problems with left leg swelling and adjustment of his diuretics he is followed by Dr. Daneen Schick. The patient has severe PAD. On 06/30/2019 he had an angiogram on the remaining left side he had heavily diseased femoral artery. His superficial femoral artery was flush occluded with runoff in the profunda. He was felt his SFA was likely occluded and reconstitutes the popliteal artery via runoff via the peroneal and posterior tibial artery. As noted he is not in a lot of pain. His last noninvasive study was on 6/20. On the left side this was noncompressible ABI but a TBI of only 0.20 with dampened monophasic waveforms 11/25/2019. Patient has 2 wound areas 1 on the left lateral heel and a ulcer on his coccyx. These are probably all pressure related although he has significant PAD complicating any attempt to heal a wound on the left heel. We have been using silver collagen on the wound and the coccyx silver alginate on the heel. Electronic Signature(s) Signed: 11/25/2019 4:54:15 PM By: [name] MD Entered By: [name] on 11/25/2019 16:04:40 -------------------------------------------------------------------------------- Physical Exam Details Patient Name: Date of Service: DO RN, [name] 11/25/2019 2:00 PM Medical Record Number: [id number] Patient Account Number: [id number] Date of Birth/Sex: Treating RN: 06-29-1929 (86 y.o. [name] Primary Care Provider: [name] Other Clinician: Referring Provider: Treating Provider/Extender: [name] in Treatment: 2 Constitutional Patient is hypertensive.. Pulse regular and within target range for patient.Marland Kitchen Respirations regular, non-labored and within target range.. Temperature is normal and within the target range for the patient.Marland Kitchen Appears in no distress. Notes Wound  exam The patient has 2 small areas. One on the lower coccyx area. The other is on the lateral part of his left heel. Both of these are about the same as last week. On the heel I removed some eschar and dry skin from around the wound edge. I did not actually get into the wound itself. His coccyx wound is punched out with raised edges. Surface is not completely viable. He the skin around this looks like pressure injury stage I Electronic Signature(s) Signed: 11/25/2019 4:54:15 PM By: [name] MD Entered By: [name] on 11/25/2019 16:06:40 -------------------------------------------------------------------------------- Physician Orders Details Patient Name: Date of Service: DO RN, [name] 11/25/2019 2:00 PM Medical Record Number: [id number] Patient Account Number: [id number] Date of Birth/Sex:  Treating RN: [birth year] (86 y.o. 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 Dressing Change Frequency Change Dressing every other day. Wound Cleansing Wound #14 Left,Lateral Calcaneus May shower and wash wound with soap and water. Wound #15 Sacrum May shower and wash wound with soap and water. Primary Wound Dressing Wound #14 Left,Lateral Calcaneus Calcium Alginate with Silver Wound #15 Sacrum Silver Collagen - moisten with normal saline Secondary Dressing Wound #14 Left,Lateral Calcaneus Foam Border Wound #15 [place] Wound #15 [place] [facility] for wound care. - [facility] Electronic Signature(s) Signed: 11/25/2019 4:54:15 PM  By: [name] MD Signed: 11/25/2019 5:00:30 PM By: [name] RN Entered By: [name] on 11/25/2019 14:04:41 -------------------------------------------------------------------------------- Problem List Details Patient Name: Date of Service: DO RN, [name] 11/25/2019 2:00 PM Medical Record Number: [id number] Patient Account Number: [id number] Date of Birth/Sex: Treating RN: 21-Sep-1929 (86 y.o. 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 11/25/2019 2:00 PM Medical Record Number: [id number] Patient Account Number: [id number] Date of Birth/Sex: Treating RN: Dec 29, 1929 (86 y.o. [name] Primary Care Provider: [name] Other Clinician: Referring Provider: Treating Provider/Extender: [name] in Treatment: 2 Subjective History of Present Illness (HPI) 01/30/18 on evaluation today patient presents for initial inspection concerning the skin tear of the right forearm. Fortunately there does not appear to be any evidence of infection and this occurred approximately three days ago. He has been using Vaseline over the region. With that being said the patient does have a history of cataracts COPD, hypertension, peripheral arterial  disease, and osteoarthritis. He tells me that he does have a little bit of pain at the site but nothing too significant at this time which is good news. Fortunately this overall appears to be doing fairly well which is good news. No fevers, chills, nausea, or vomiting noted at this time. The biggest issue at this point is that the wound is of quite significant size. READMISSION 04/21/2019 This is a 86 year old man with multiple medical problems who was seen once in the clinic here in January 2020 with a skin tear on his right forearm seen by [name]. This problem this time started sometime recently. He was noted by podiatry to have erosions and wounds of his right foot on toes 1-3. He also saw [name] of dermatology who gave him Silvadene cream for what I think was felt to be a stasis dermatitis of his bilateral lower legs. At some point he was referred to [name]. He was also noted to have previous noninvasive arterial studies that showed no flow to either  one of his toes and noncompressible ABIs bilaterally. [name] did noninvasive arterial studies on him. These showed that he had greater than 75% stenosis in the proximal and mid common femoral artery. Monophasic flow in the posterior tibial and dorsalis pedis positions on the right foot. ABIs again were noncompressible. He was felt to have total occlusion of the superficial femoral artery on the right. The overall interpretation is that he had severe multilevel arterial occlusive disease. Absent femoral pulses and superficial femoral artery occlusions. In spite of this he was not felt to be candidate for arteriography given his bilateral femoral artery occlusions as well he would not be a candidate for endovascular approach secondary to his common femoral artery occlusions and proximal iliac disease. He was not felt to be a candidate for an open infra inguinal bypass. He therefore felt he would need to be monitored over time with the only  option being a right below-knee amputation The patient complains currently of pain at night when he is up in bed. The pain in the right foot is better when he puts the leg down over the bed side. This is compatible with rest pain. He has very limited activity i.e. is even exhausted getting dressed in the morning because of cardiopulmonary issues therefore it is difficult to gauge claudication I believe he was referred here by Dr. [name] with one of the major issues is whether he would be a candidate for hyperbaric oxygen. He is not a diabetic. Dr. [name] did give him Silvadene cream for dry flaking skin in his bilateral lower extremities, however the patient is concerned about using this in the face of sulfa allergy. 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 This is on the medial aspect extended there is a second area on the plantar aspect. Most of what the patient talks about is claudication with minimal activity [however the patient is limited by his COPD], or even at rest at night. He is not getting a lot of sleep. We use silver collagen on this wound 5/4; the patient has a wound on the tip of his right great toe also on the medial aspect. Setting of severe nonrevascularizable PAD [see description in HPI]. He does not describe any change in his pain. We have been using silver collagen 5/18; 2-week follow-up. The patient has wound on the tip of his right great toe as well as the medial aspect. He has developed new wounds x2 on the right lateral foot and the right lateral calcaneus. These look like ischemic areas. The patient talks about claudication at night. He seems  to be more comfortable during the day. We have been using silver collagen to the wounds. I have previously debrided the eschar on the toes however this comes right back. 6/1; 2-week follow-up. This patient has ischemic wounds on his right foot in the setting of nonunreconstructable PAD [previously reviewed by Dr. Dixon]. He does not really describe severe pain but he does have episodic pain in the right great toe. He has an area on the tip of the right great toe as well as the medial aspect of the right great toe. A new area on the tip of the right fourth  toe. He has the area on the right lateral foot fortunately the area on the right lateral heel appears to have epithelialized over. We are using silver collagen to all wounds 06/23/2019 upon evaluation today patient appears to be doing about the same in regard to his foot ulcers. Unfortunately he has 2 new skin tears on his left elbow and the right forearm that were new as of today. He states that it was doing okay until they went to change the dressing and they put some collagen on it that got stuck and somewhat pulled back the skin unfortunately. Fortunately there is no signs of active infection at this time. No fevers, chills, nausea, vomiting, or diarrhea. 6/29; the patient was hospitalized from 6/18 through 6/23 going in with increasing drainage from the wounds on his right foot. He underwent a angiogram on 06/30/2019 by Dr. Donzetta Matters which revealed multilevel arterial disease including occlusion of the right common femoral artery. He reconstitutes the profunda femoris and the below-knee popliteal artery and appears to have posterior tibial peroneal runoff to the ankle although there is heavy calcification of the popliteal below the knee. He was offered consideration of a right above-knee amputation, palliative and/or hospice care and consideration of a very high risk attempt at bypass. He did receive IV antibiotics. He comes in the clinic today again  with the 2 areas that are necrotic on the left great toe and a small punched out area on the right lateral foot. A lot of the erythema on top of the right foot but no warmth. He has a lot of swelling here. The patient is literally tortured over this decision that he needs to make. I went over this with him in exceptional detail. I do not believe he is a candidate for any form of open bypass surgery. I think his vascular doctors would agree with that. He still has pain at night when he puts his leg up on the bed that is relieved by putting his leg dependent. This is not different from what I remember. He is not septic there is no additional skin breakdown that I can see 7/8 the patient continues to have severe pain in the right foot at night making it difficult for him to rest. There is increasing erythema in the right foot but the foot is cold. I do not think this is an infection I think this is likely to be tissue necrosis from ischemia. I thought he would have made an up decision today about going forward with hospice he has not. He did not go to see [name] on 7/6. Again he wants to talk to me at length about amputations. I do not think he will do well after an above-knee amputation on the right. Furthermore I do not think he is a candidate for bypass surgery on the right leg 7/20; patient still having a lot of pain in the right foot. He saw Dr. Donzetta Matters on 7/16. He is not felt to be able to undergo a complex revascularization and I certainly agree with that. He was offered a right below-knee amputation but he has not made that decision. I have repetitively suggested hospice care if he will not undergo an amputation he is not decided he wants to go that route either. He arrives in the clinic today accompanied by his son who is a Stage manager and his wife. I think the patient has tissue breakdown in the dorsal foot secondary to ischemia although his son brought up that some  of the erythema could be a  contact dermatitis as they were apparently layering a large area of pure call over the wound area. I was not aware of this not supposed to be doing it in this fashion. In any case the erythema look better than last week and I am quite convinced this is not cellulitis READMISSION 11/11/2019 Mr. Paula Libra is now an 86 year old man who we had in this clinic from [place] through July/21 largely as a result of ischemic wounds in his right foot. He was followed by vein and vascular [name]. His anatomy was not felt to be amenable to a revascularization. The patient had increasing ischemic pain but was very resistant to the notion of an amputation. Eventually he did go through with this on August 22 he will underwent an AKA by [name]. He went to rehabilitation. There he developed a small stage III lower sacral pressure ulcer and an area on his left lateral heel which is also probably a pressure ulcer. They have been using silver collagen on both wound areas. The patient states he is not in any unrelieved pain. He is in a wheelchair at home with a Roho cushion. He is able to do his own transfers. I think he is probably putting far too much pressure on these areas Past medical history is reviewed. He has a history of CHF, severe PAD, hypertension, coronary artery disease status post CABG, COPD Gold stage IV on O2, hypothyroidism and a recent problem with urinary retention he now has a Foley catheter. He has had problems with left leg swelling and adjustment of his diuretics he is followed by Dr. Daneen Schick. The patient has severe PAD. On 06/30/2019 he had an angiogram on the remaining left side he had heavily diseased femoral artery. His superficial femoral artery was flush occluded with runoff in the profunda. He was felt his SFA was likely occluded and reconstitutes the popliteal artery via runoff via the peroneal and posterior tibial artery. As noted he is not in a lot of pain. His last noninvasive study  was on 6/20. On the left side this was noncompressible ABI but a TBI of only 0.20 with dampened monophasic waveforms 11/25/2019. Patient has 2 wound areas 1 on the left lateral heel and a ulcer on his coccyx. These are probably all pressure related although he has significant PAD complicating any attempt to heal a wound on the left heel. We have been using silver collagen on the wound and the coccyx silver alginate on the heel. Objective Constitutional Patient is hypertensive.. Pulse regular and within target range for patient.Marland Kitchen Respirations regular, non-labored and within target range.. Temperature is normal and within the target range for the patient.Marland Kitchen Appears in no distress. Vitals Time Taken: 2:20 PM, Height: 69 in, Temperature: 98.5 F, Pulse: 96 bpm, Respiratory Rate: 18 breaths/min, Blood Pressure: 177/79 mmHg. General Notes: Wound exam ooThe patient has 2 small areas. One on the lower coccyx area. The other is on the lateral part of his left heel. Both of these are about the same as last week. On the heel I removed some eschar and dry skin from around the wound edge. I did not actually get into the wound itself. His coccyx wound is punched out with raised edges. Surface is not completely viable. He the skin around this looks like pressure injury stage I Integumentary (Hair, Skin) Wound #14 status is Open. Original cause of wound was Pressure Injury. The wound is located on the Left,Lateral Calcaneus. The  wound measures 0.2cm length x 0.2cm width x 0.1cm depth; 0.031cm^2 area and 0.003cm^3 volume. There is Fat Layer (Subcutaneous Tissue) exposed. There is no tunneling or undermining noted. There is a small amount of serous drainage noted. The wound margin is flat and intact. There is small (1-33%) pink granulation within the wound bed. There is no necrotic tissue within the wound bed. Wound #15 status is Open. Original cause of wound was Pressure Injury. The wound is located on the Sacrum.  The wound measures 0.5cm length x 0.4cm width x 0.3cm depth; 0.157cm^2 area and 0.047cm^3 volume. There is Fat Layer (Subcutaneous Tissue) exposed. There is no tunneling or undermining noted. There is a small amount of serous drainage noted. The wound margin is epibole. There is medium (34-66%) red, pink granulation within the wound bed. There is a small (1-33%) amount of necrotic tissue within the wound bed including Adherent Slough. Assessment Active Problems ICD-10 Pressure ulcer of sacral region, stage 3 Pressure ulcer of left heel, stage 3 Acquired absence of right leg above knee Procedures Wound #14 Pre-procedure diagnosis of Wound #14 is a Pressure Ulcer located on the Left,Lateral Calcaneus . There was a Selective/Open Wound Skin/Epidermis Debridement with a total area of 0.04 sq cm performed by Ricard Dillon., MD. With the following instrument(s): Curette to remove Viable and Non-Viable tissue/material. Material removed includes Slough, Skin: Dermis, and Skin: Epidermis after achieving pain control using Lidocaine 5% topical ointment. No specimens were taken. A time out was conducted at 15:07, prior to the start of the procedure. A Minimum amount of bleeding was controlled with Pressure. The procedure was tolerated well with a pain level of 0 throughout and a pain level of 0 following the procedure. Post Debridement Measurements: 0.2cm length x 0.2cm width x 0.1cm depth; 0.003cm^3 volume. Post debridement Stage noted as Category/Stage III. Character of Wound/Ulcer Post Debridement is improved. Post procedure Diagnosis Wound #14: Same as Pre-Procedure Plan Follow-up Appointments: Return Appointment in 2 weeks. Dressing Change Frequency: Change Dressing every other day. Wound Cleansing: Wound #14 Left,Lateral Calcaneus: May shower and wash wound with soap and water. Wound #15 Sacrum: May shower and wash wound with soap and water. Primary Wound Dressing: Wound #14  Left,Lateral Calcaneus: Calcium Alginate with Silver Wound #15 Sacrum: Silver Collagen - moisten with normal saline Secondary Dressing: Wound #14 Left,Lateral Calcaneus: Foam Border Wound #15 Sacrum: Foam Border Home Health: Wound #15 Sacrum: [facility]. - [facility] 1. I continued with the silver collagen border foam to the lower sacral wound we are not making progress here. I went over pressure relief with the patient and his wife this will not heal without this type of offloading. Even in wheelchair with a Roho cushion there is no substitute for taking the pressure off this area frequently. 2. I remove debris around the circumference of the wound on the lateral heel to fully expose this. I did not get into the actual substance of the wound. This is a pale and I think ischemic. I think the chances of healing this are not high. We are using silver alginate Electronic Signature(s) Signed: 11/25/2019 4:54:15 PM By: [name] MD Entered By: [name] on 11/25/2019 16:07:53 -------------------------------------------------------------------------------- SuperBill Details Patient Name: Date of Service: DO RN, [name] 11/25/2019 Medical Record Number: [id number] Patient Account Number: [id number] Date of Birth/Sex: Treating RN: [birth year] (86 y.o. [name] Primary Care Provider: [name] Other Clinician: Referring Provider: Treating Provider/Extender: Bertha Stakes,  Docia Barrier in Treatment: 2 Diagnosis Coding ICD-10 Codes Code Description L89.153 Pressure ulcer of sacral region, stage 3 L89.623 Pressure ulcer of left heel, stage 3 Z89.611 Acquired absence of right leg above knee Facility Procedures CPT4 Code: [id number] Description: 00634 - DEBRIDE WOUND 1ST 20 SQ CM OR < ICD-10 Diagnosis Description L89.623 Pressure ulcer of left heel, stage 3 Modifier: Quantity: 1 Physician Procedures : CPT4 Code  Description Modifier [id number] - WC PHYS DEBR WO ANESTH 20 SQ CM ICD-10 Diagnosis Description L89.623 Pressure ulcer of left heel, stage 3 Quantity: 1 Electronic Signature(s) Signed: 11/25/2019 4:54:15 PM By: [name] MD Entered By: [name] on 11/25/2019 16:08:10

## 2019-11-27 ENCOUNTER — Other Ambulatory Visit: Payer: Self-pay

## 2019-11-27 ENCOUNTER — Ambulatory Visit (INDEPENDENT_AMBULATORY_CARE_PROVIDER_SITE_OTHER): Payer: Self-pay | Admitting: Physician Assistant

## 2019-11-27 ENCOUNTER — Ambulatory Visit (HOSPITAL_COMMUNITY)
Admission: RE | Admit: 2019-11-27 | Discharge: 2019-11-27 | Disposition: A | Discharge status: Home / Self Care | Payer: Medicare Other | Source: Ambulatory Visit | Attending: Physician Assistant | Admitting: Physician Assistant

## 2019-11-27 VITALS — BP 128/63 | HR 101 | Temp 98.7°F

## 2019-11-27 DIAGNOSIS — L89152 Pressure ulcer of sacral region, stage 2: Secondary | ICD-10-CM | POA: Diagnosis not present

## 2019-11-27 DIAGNOSIS — I739 Peripheral vascular disease, unspecified: Secondary | ICD-10-CM

## 2019-11-27 DIAGNOSIS — I110 Hypertensive heart disease with heart failure: Secondary | ICD-10-CM | POA: Diagnosis not present

## 2019-11-27 DIAGNOSIS — I480 Paroxysmal atrial fibrillation: Secondary | ICD-10-CM | POA: Diagnosis not present

## 2019-11-27 DIAGNOSIS — L89622 Pressure ulcer of left heel, stage 2: Secondary | ICD-10-CM | POA: Diagnosis not present

## 2019-11-27 DIAGNOSIS — I5032 Chronic diastolic (congestive) heart failure: Secondary | ICD-10-CM | POA: Diagnosis not present

## 2019-11-27 NOTE — Progress Notes (Signed)
POST OPERATIVE OFFICE NOTE    CC:  F/u for surgery  HPI:  This is a 86 y.o. male who is s/p right AKA on 08/11/2019 by Dr. Scot Dock.    This was secondary to peripheral vascular disease with tissue loss.  Pt had his staples removed on 09/08/2019.  He was last seen in September and at that time, he had developed pain about a month prior.  He was working with PT and they advised him to massage his residual limb and this did help to some degree.  He did not tolerate Neurontin and Dr. Posey Pronto started him on Lyrica.   He does have hx of left heel ulcer.  This is being followed by Dr. Dellia Nims.    History of severe COPD O2 dependent. He is compliant with aspirin and statin. History of bilateral carotid stenosis estimated right ICA 1 to 39%, left ICA 1 to 39%.   Pt returns today for follow up.  He complains of pain in his right AKA stump.  He is concerned about the "flabby tissue" medially.  He was prescribed Lyrica but did not want to take this due to the long list of side effects and he does not tolerate Gabapentin.     He also has a small wound on the lateral aspect of the left heel.  He is going to Dr. Dellia Nims every couple of weeks for wound check.  His wife states this is getting better.  He has considerable swelling in the left foot/leg and has been wearing a compression sock to help with this.  He has also been elevating his leg.  He states that his left foot is numb but not really painful.   His wife states the swelling gets better with the 3rd dose of lasix.  Dr. Tamala Julian manages his heart failure.     Allergies  Allergen Reactions  . Cefdinir Diarrhea and Other (See Comments)    Severe Diarrhea  . Nitrofurantoin Swelling and Other (See Comments)    Hand Swelling  . Sulfa Antibiotics Anaphylaxis and Swelling  . Sulfonamide Derivatives Swelling and Other (See Comments)    Facial/tongue swelling  . Tape Other (See Comments)    SKIN IS VERY THIN AND TEARS EASILY!!!!! Please do NOT use "plastic"  tape- USE PAPER!!  . Ciprofloxacin Itching, Rash and Other (See Comments)    Red itchy hands  . Nitrofurantoin Swelling and Other (See Comments)    Swollen hands  . Amoxicillin Er Other (See Comments)    Frequest urination  . Doxycycline Other (See Comments)    "Felt terrible"  . Levaquin [Levofloxacin] Itching and Rash  . Sertraline Anxiety and Other (See Comments)    Makes the patient jittery    Current Outpatient Medications  Medication Sig Dispense Refill  . ALPRAZolam (XANAX) 0.25 MG tablet TAKE 1 TABLET BY MOUTH THREE TIMES DAILY AS NEEDED 90 tablet 2  . aspirin 81 MG tablet Take 81 mg by mouth daily.    Marland Kitchen atorvastatin (LIPITOR) 20 MG tablet TAKE 1 TABLET BY MOUTH EVERY DAY (Patient taking differently: Take 20 mg by mouth at bedtime. ) 90 tablet 2  . calcium carbonate (TUMS - DOSED IN MG ELEMENTAL CALCIUM) 500 MG chewable tablet Chew 1 tablet (200 mg of elemental calcium total) by mouth 2 (two) times daily as needed for indigestion or heartburn.    . Cholecalciferol (VITAMIN D3) 2000 units TABS Take 2,000 Units by mouth daily with lunch.     . famotidine (PEPCID) 20  MG tablet Take 20 mg by mouth at bedtime.     . feeding supplement, ENSURE ENLIVE, (ENSURE ENLIVE) LIQD Take 237 mLs by mouth 3 (three) times daily between meals. 237 mL 12  . finasteride (PROSCAR) 5 MG tablet Take 5 mg by mouth daily with lunch.     . fluticasone (FLONASE) 50 MCG/ACT nasal spray Place 2 sprays into both nostrils daily.    . furosemide (LASIX) 20 MG tablet Take 20 mg by mouth. Take two tablets by mouth every morning.  Take one tablet by mouth every evening.    . hydrocerin (EUCERIN) CREA Apply 1 application topically 2 (two) times daily. To foot  0  . levalbuterol (XOPENEX HFA) 45 MCG/ACT inhaler Inhale 2 puffs into the lungs every 4 (four) hours as needed for wheezing or shortness of breath. 15 g 5  . levalbuterol (XOPENEX) 0.63 MG/3ML nebulizer solution Take 3 mLs (0.63 mg total) by nebulization every  4 (four) hours as needed for wheezing or shortness of breath. 120 mL 3  . levothyroxine (SYNTHROID) 50 MCG tablet Take 50 mcg by mouth daily.    . metoprolol succinate (TOPROL-XL) 25 MG 24 hr tablet Take 0.5 tablets (12.5 mg total) by mouth daily. 45 tablet 3  . mirtazapine (REMERON) 15 MG tablet Take 15 mg by mouth at bedtime.    . Multiple Vitamins-Minerals (PRESERVISION AREDS 2) CAPS Take 1 capsule by mouth daily.    Marland Kitchen NITROSTAT 0.4 MG SL tablet Place 0.4 mg under the tongue every 5 (five) minutes as needed for chest pain.    . OXYGEN Inhale 2 L/min into the lungs continuous.     . pantoprazole (PROTONIX) 40 MG tablet TAKE 1 TABLET(40 MG) BY MOUTH DAILY 30 TO 60 MINUTES BEFORE FIRST MEAL OF THE DAY 30 tablet 3  . Polyethyl Glycol-Propyl Glycol (SYSTANE) 0.4-0.3 % SOLN Apply 1 drop to eye 2 (two) times daily as needed (eye irritation).  0  . polyethylene glycol (MIRALAX / GLYCOLAX) 17 g packet Take 17 g by mouth daily. 14 each 0  . potassium chloride 20 MEQ/15ML (10%) SOLN Take 20 mEq by mouth daily.    . predniSONE (DELTASONE) 10 MG tablet FOR FLARE OF COUGH/WHEEZING MAY INCREASE TO 2 DAILY BETTER, THAN 1 TABLET BY MOUTH EVERY DAY 100 tablet 2  . pregabalin (LYRICA) 50 MG capsule Take 1 capsule (50 mg total) by mouth 2 (two) times daily. 60 capsule 1  . silodosin (RAPAFLO) 8 MG CAPS capsule Take 8 mg by mouth daily.     . sodium chloride (MURO 128) 5 % ophthalmic solution Place 1 drop into both eyes 4 (four) times daily. 15 mL   . spironolactone (ALDACTONE) 25 MG tablet Take 25 mg by mouth daily.    . SYMBICORT 160-4.5 MCG/ACT inhaler Inhale 2 puffs into the lungs 2 (two) times daily. 1 Inhaler 5  . Tiotropium Bromide Monohydrate (SPIRIVA RESPIMAT) 2.5 MCG/ACT AERS INHALE 2 PUFFS BY MOUTH EVERY DAY (Patient taking differently: Inhale 2 puffs into the lungs daily. ) 4 g 11  . vitamin B-12 (CYANOCOBALAMIN) 1000 MCG tablet Take 1,000 mcg by mouth daily.     . Zinc Oxide (TRIPLE PASTE) 12.8 %  ointment Apply topically as needed for irritation. To gluteal cleft/buttocks 56.7 g 0  . gabapentin (NEURONTIN) 100 MG capsule Take by mouth.    . mometasone (ELOCON) 0.1 % cream Apply topically daily as needed.     No current facility-administered medications for this visit.  ROS:  See HPI  Physical Exam:  Today's Vitals   11/27/19 1431  BP: 128/63  Pulse: (!) 101  Temp: 98.7 F (37.1 C)  TempSrc: Temporal  SpO2: 99%  PainSc: 6   PainLoc: Leg   There is no height or weight on file to calculate BMI.   Incision:  Right AKA stump looks great and has healed nicely.   Extremities:  Monophasic doppler signals left DP/PT/peroneal.  The foot is ruborous.  There is a small superficial lateral heel wound.  Does not appear infected.  Foley back strapped to left thigh.       ABI 11/27/2019: +-------+-----------+-----------+------------+------------+  ABI/TBIToday's ABIToday's TBIPrevious ABIPrevious TBI  +-------+-----------+-----------+------------+------------+  Right AKA                        +-------+-----------+-----------+------------+------------+  Left  [place]     absent   [place]     0.2       +-------+-----------+-----------+------------+------------+   Aortogram 06/30/2019: Findings: Aorta was healthy down to the level of the renal arteries below which there is severe calcification.  He has severe iliac calcification bilaterally but actually does not appear to have a significant flow-limiting stenosis.  The right common femoral artery is occluded with heavy calcification.  He does reconstitute a profunda femoris after several centimeters.  He also reconstitutes the below-knee popliteal artery and appears to have posterior tibial peroneal runoff to the ankle although there is heavy calcification of the popliteal below the knee.  On the left side become femoral artery is heavily diseased the SFA is flush occluded there is  runoff in the profunda.  Similarly has disease throughout his SFA is likely occluded and reconstitutes the popliteal artery with runoff via the peroneal and posterior tibial artery.   Assessment/Plan:  This is a 86 y.o. male who is s/p: Right AKA and has severe arterial disease LLE  -pt seen and examined with Dr. Scot Dock.  He reviewed pt's arteriogram from June and he has severely calcified vessels.  He does not have any options for revascularization.  Given this, Dr. Scot Dock recommended that pt not wear his compression sock given his arterial disease and small wound.  He also recommended mild elevation and if he does not tolerate this, to lower his elevation.  Also, his foley bag straps are somewhat tight.  Dr. Scot Dock recommended emptying the foley bag more often so these do not have to be so tight to help prevent more swelling.  Also, if possible float heel off bed to help prevent any new sores. -Dr. Scot Dock discussed with pt that he does not have any revascularization options and that if he developed worsening or new wounds, that he may need an amputation on the left side.  Pt does not want this.  -pt has voiding trial in the near future.  -pt will follow up in 4 weeks for wound check with PA on Dr. Nicole Cella clinic day.   His wife is in agreement with this plan. -ABI unreliable due to non compressible vessels.   Leontine Locket, [facility] [phone]  Clinic MD:  Scot Dock

## 2019-11-28 DIAGNOSIS — F324 Major depressive disorder, single episode, in partial remission: Secondary | ICD-10-CM | POA: Diagnosis not present

## 2019-11-28 DIAGNOSIS — Z0000 Encounter for general adult medical examination without abnormal findings: Secondary | ICD-10-CM | POA: Diagnosis not present

## 2019-11-28 NOTE — Progress Notes (Signed)
[name], [name] [name] ([id number]) Visit Report for 11/25/2019 Arrival Information Details Patient Name: Date of Service: DO RN, [place] V ID L. 11/25/2019 2:00 PM Medical Record Number: [id number] Patient Account Number: [id number] Date of Birth/Sex: Treating RN: May 25, 1929 (86 y.o. [name]) [name] Primary Care [name]: [name] Other Clinician: Referring Matylda Fehring: Treating [name]/Extender: [name] in Treatment: 2 Visit Information History Since Last Visit Added or deleted any medications: No Patient Arrived: Wheel Chair Any new allergies or adverse reactions: No Arrival Time: 14:20 Had a fall or experienced change in No Accompanied By: wife activities of daily living that may affect Transfer Assistance: None risk of falls: Patient Identification Verified: Yes Signs or symptoms of abuse/neglect since last visito No Secondary Verification Process Completed: Yes Hospitalized since last visit: No Patient Requires Transmission-Based Precautions: No Implantable device outside of the clinic excluding No Patient Has Alerts: Yes cellular tissue based products placed in the center Patient Alerts: L ABI N/C, TBI = .20 since last visit: Has Dressing in Place as Prescribed: Yes Pain Present Now: No Electronic Signature(s) Signed: 11/26/2019 11:19:23 AM By: [name] Entered By: [name] on 11/25/2019 14:20:45 -------------------------------------------------------------------------------- Encounter Discharge Information Details Patient Name: Date of Service: DO RN, [name] 11/25/2019 2:00 PM Medical Record Number: [id number] Patient Account Number: [id number] Date of Birth/Sex: Treating RN: [birth year] (86 y.o. [name] Primary Care Perseus Westall: [name] Other Clinician: Referring [name]: Treating Denym Rahimi/Extender: [name] in Treatment: 2 Encounter Discharge Information Items Post Procedure  Vitals Discharge Condition: Stable Temperature (F): 98.5 Ambulatory Status: Wheelchair Pulse (bpm): 96 Discharge Destination: Home Respiratory Rate (breaths/min): 18 Transportation: Private Auto Blood Pressure (mmHg): 177/79 Accompanied By: wife Schedule Follow-up Appointment: Yes Clinical Summary of Care: Patient Declined Electronic Signature(s) Signed: 11/26/2019 5:17:00 PM By: Levan Hurst RN, BSN Entered By: Levan Hurst on 11/25/2019 15:30:39 -------------------------------------------------------------------------------- Lower Extremity Assessment Details Patient Name: Date of Service: DO RN, [name] 11/25/2019 2:00 PM Medical Record Number: [id number] Patient Account Number: [id number] Date of Birth/Sex: Treating RN: [birth year] (86 y.o. [name]) [name] Primary Care Muriah Harsha: [name] Other Clinician: Referring Renee Beale: Treating Gibson Lad/Extender: [name] in Treatment: 2 Edema Assessment Assessed: [Left: Yes] [Right: No] Edema: [Left: Ye] [Right: s] Calf Left: Right: Point of Measurement: From Medial Instep 34.5 cm Ankle Left: Right: Point of Measurement: From Medial Instep 26.6 cm Vascular Assessment Pulses: Dorsalis Pedis Palpable: [Left:No] Electronic Signature(s) Signed: 11/25/2019 5:00:30 PM By: [name] RN Signed: 11/28/2019 12:02:47 PM By: Rhae Hammock RN Entered By: Rhae Hammock on 11/25/2019 14:40:13 -------------------------------------------------------------------------------- Multi Wound Chart Details Patient Name: Date of Service: DO RN, [name] 11/25/2019 2:00 PM Medical Record Number: [id number] Patient Account Number: [id number] Date of Birth/Sex: Treating RN: 01-22-1929 (86 y.o. [name]) [name], [name] Primary Care Lizanne Erker: [name] Other Clinician: Referring Reed Dady: Treating Macaria Bias/Extender: [name] in Treatment: 2 Vital Signs Height(in):  69 Pulse(bpm): 80 Weight(lbs): Blood Pressure(mmHg): 177/79 Body Mass Index(BMI): Temperature(F): 98.5 Respiratory Rate(breaths/min): 18 Photos: [14:No Photos Left, Lateral Calcaneus] [15:No Photos Sacrum] [N/A:N/A N/A] Wound Location: [14:Pressure Injury] [15:Pressure Injury] [N/A:N/A] Wounding Event: [14:Pressure Ulcer] [15:Pressure Ulcer] [N/A:N/A] Primary Etiology: [14:Cataracts, Asthma, Chronic] [15:Cataracts, Asthma, Chronic] [N/A:N/A] Comorbid History: [14:Obstructive Pulmonary Disease (COPD), Congestive Heart Failure, Coronary Artery Disease, Hypertension, Peripheral Arterial Disease, Osteoarthritis, Confinement Disease, Osteoarthritis, Confinement Anxiety 08/01/2019] [15:Obstructive  Pulmonary Disease (COPD), Congestive Heart Failure, Coronary Artery Disease, Hypertension, Peripheral Arterial Anxiety 08/01/2019] [N/A:N/A] Date Acquired: [14:2] [15:2] [N/A:N/A] Weeks  of Treatment: [14:Open] [15:Open] [N/A:N/A] Wound Status: [14:0.2x0.2x0.1] [15:0.5x0.4x0.3] [N/A:N/A] Measurements L x W x D (cm) [14:0.031] [15:0.157] [N/A:N/A] A (cm) : rea [14:0.003] [15:0.047] [N/A:N/A] Volume (cm) : [14:56.30%] [15:28.60%] [N/A:N/A] % Reduction in A [14:rea: 57.10%] [15:-113.60%] [N/A:N/A] % Reduction in Volume: [14:Category/Stage III] [15:Category/Stage III] [N/A:N/A] Classification: [14:Small] [15:Small] [N/A:N/A] Exudate A mount: [14:Serous] [15:Serous] [N/A:N/A] Exudate Type: [14:amber] [15:amber] [N/A:N/A] Exudate Color: [14:Flat and Intact] [15:Epibole] [N/A:N/A] Wound Margin: [14:Small (1-33%)] [15:Medium (34-66%)] [N/A:N/A] Granulation A mount: [14:Pink] [15:Red, Pink] [N/A:N/A] Granulation Quality: [14:None Present (0%)] [15:Small (1-33%)] [N/A:N/A] Necrotic A mount: [14:Fat Layer (Subcutaneous Tissue): Yes Fat Layer (Subcutaneous Tissue): Yes N/A] Exposed Structures: [14:Fascia: No Tendon: No Muscle: No Joint: No Bone: No Large (67-100%)] [15:Fascia: No Tendon: No Muscle: No  Joint: No Bone: No None] [N/A:N/A] Epithelialization: [14:Debridement - Selective/Open Wound N/A] [N/A:N/A] Debridement: Pre-procedure Verification/Time Out 15:07 [15:N/A] [N/A:N/A] Taken: [14:Lidocaine 5% topical ointment] [15:N/A] [N/A:N/A] Pain Control: [14:Slough] [15:N/A] [N/A:N/A] Tissue Debrided: [14:Skin/Epidermis] [15:N/A] [N/A:N/A] Level: [14:0.04] [15:N/A] [N/A:N/A] Debridement A (sq cm): [14:rea Curette] [15:N/A] [N/A:N/A] Instrument: [14:Minimum] [15:N/A] [N/A:N/A] Bleeding: [14:Pressure] [15:N/A] [N/A:N/A] Hemostasis A chieved: [14:0] [15:N/A] [N/A:N/A] Procedural Pain: [14:0] [15:N/A] [N/A:N/A] Post Procedural Pain: [14:Procedure was tolerated well] [15:N/A] [N/A:N/A] Debridement Treatment Response: [14:0.2x0.2x0.1] [15:N/A] [N/A:N/A] Post Debridement Measurements L x W x D (cm) [14:0.003] [15:N/A] [N/A:N/A] Post Debridement Volume: (cm) [14:Category/Stage III] [15:N/A] [N/A:N/A] Post Debridement Stage: [14:Debridement] [15:N/A] [N/A:N/A] Treatment Notes Wound #14 (Left, Lateral Calcaneus) 1. Cleanse With Wound Cleanser 3. Primary Dressing Applied Calcium Alginate Ag 4. Secondary Dressing Foam Border Dressing Wound #15 (Sacrum) 1. Cleanse With Wound Cleanser 3. Primary Dressing Applied Collegen AG 4. Secondary Dressing Foam Border Dressing Electronic Signature(s) Signed: 11/25/2019 4:54:15 PM By: [name] MD Signed: 11/25/2019 5:00:30 PM By: [name] RN Entered By: [name] on 11/25/2019 16:03:20 -------------------------------------------------------------------------------- Multi-Disciplinary Care Plan Details Patient Name: Date of Service: DO RN, [name] 11/25/2019 2:00 PM Medical Record Number: [id number] Patient Account Number: [id number] Date of Birth/Sex: Treating RN: 1929-10-11 (86 y.o. [name] Primary Care Makyi Ledo: [name] Other Clinician: Referring Tabbatha Bordelon: Treating Callista Hoh/Extender: [name] in Treatment: 2 Active Inactive Wound/Skin Impairment Nursing Diagnoses: Knowledge deficit related to ulceration/compromised skin integrity Goals: Patient/caregiver will verbalize understanding of skin care regimen Date Initiated: 11/11/2019 Target Resolution Date: 12/11/2019 Goal Status: Active Ulcer/skin breakdown will have a volume reduction of 30% by week 4 Date Initiated: 11/11/2019 Target Resolution Date: 12/11/2019 Goal Status: Active Interventions: Assess patient/caregiver ability to obtain necessary supplies Assess patient/caregiver ability to perform ulcer/skin care regimen upon admission and as needed Assess ulceration(s) every visit Notes: Electronic Signature(s) Signed: 11/25/2019 5:00:30 PM By: [name] RN Entered By: [name] on 11/25/2019 14:04:54 -------------------------------------------------------------------------------- Pain Assessment Details Patient Name: Date of Service: DO RN, [name] 11/25/2019 2:00 PM Medical Record Number: [id number] Patient Account Number: [id number] Date of Birth/Sex: Treating RN: [birth year] (86 y.o. [name] Primary Care Sharilynn Cassity: [name] Other Clinician: Referring Lanaysia Fritchman: Treating Ishani Goldwasser/Extender: [name] in Treatment: 2 Active Problems Location of Pain Severity and Description of Pain Patient Has Paino No Site Locations Pain Management and Medication Current Pain Management: Electronic Signature(s) Signed: 11/25/2019 5:00:30 PM By: [name] RN Signed: 11/26/2019 11:19:23 AM By: [name] Entered By: [name] on 11/25/2019 14:21:09 -------------------------------------------------------------------------------- Patient/Caregiver Education Details Patient Name: Date of Service: DO RN, [name] 11/16/2021andnbsp2:00 PM Medical Record Number: [id number] Patient Account Number: [id number] Date of Birth/Gender: Treating RN: [birth year]  (86 y.o.  [name] Primary Care Physician: [name] Other Clinician: Referring Physician: Treating Physician/Extender: [name] in Treatment: 2 Education Assessment Education Provided To: Patient Education Topics Provided Wound/Skin Impairment: Methods: Explain/Verbal Responses: State content correctly Electronic Signature(s) Signed: 11/25/2019 5:00:30 PM By: [name] RN Entered By: [name] on 11/25/2019 14:05:13 -------------------------------------------------------------------------------- Wound Assessment Details Patient Name: Date of Service: DO RN, [name] 11/25/2019 2:00 PM Medical Record Number: [id number] Patient Account Number: [id number] Date of Birth/Sex: Treating RN: 05-Aug-1929 (86 y.o. [name]) [name] Primary Care Ahnika Hannibal: [name] Other Clinician: Referring Story Conti: Treating Dayanna Pryce/Extender: [name] in Treatment: 2 Wound Status Wound Number: 14 Primary Pressure Ulcer Etiology: Wound Location: Left, Lateral Calcaneus Wound Open Wounding Event: Pressure Injury Status: Date Acquired: 08/01/2019 Comorbid Cataracts, Asthma, Chronic Obstructive Pulmonary Disease Weeks Of Treatment: 2 History: (COPD), Congestive Heart Failure, Coronary Artery Disease, Clustered Wound: No Hypertension, Peripheral Arterial Disease, Osteoarthritis, Confinement Anxiety Photos Photo Uploaded By: Mikeal Hawthorne on 11/27/2019 09:46:14 Wound Measurements Length: (cm) 0.2 Width: (cm) 0.2 Depth: (cm) 0.1 Area: (cm) 0.031 Volume: (cm) 0.003 % Reduction in Area: 56.3% % Reduction in Volume: 57.1% Epithelialization: Large (67-100%) Tunneling: No Undermining: No Wound Description Classification: Category/Stage III Wound Margin: Flat and Intact Exudate Amount: Small Exudate Type: Serous Exudate Color: amber Foul Odor After Cleansing: No Slough/Fibrino Yes Wound Bed Granulation Amount:  Small (1-33%) Exposed Structure Granulation Quality: Pink Fascia Exposed: No Necrotic Amount: None Present (0%) Fat Layer (Subcutaneous Tissue) Exposed: Yes Tendon Exposed: No Muscle Exposed: No Joint Exposed: No Bone Exposed: No Treatment Notes Wound #14 (Left, Lateral Calcaneus) 1. Cleanse With Wound Cleanser 3. Primary Dressing Applied Calcium Alginate Ag 4. Secondary Dressing Foam Border Dressing Electronic Signature(s) Signed: 11/25/2019 5:00:30 PM By: [name] RN Signed: 11/28/2019 12:02:47 PM By: Rhae Hammock RN Entered By: Rhae Hammock on 11/25/2019 14:37:32 -------------------------------------------------------------------------------- Wound Assessment Details Patient Name: Date of Service: DO RN, [name] 11/25/2019 2:00 PM Medical Record Number: [id number] Patient Account Number: [id number] Date of Birth/Sex: Treating RN: Jul 28, 1929 (86 y.o. [name]) [name] Primary Care Valary Manahan: [name] Other Clinician: Referring Alizza Sacra: Treating Hendrik Donath/Extender: [name] in Treatment: 2 Wound Status Wound Number: 15 Primary Pressure Ulcer Etiology: Wound Location: Sacrum Wound Open Wounding Event: Pressure Injury Status: Date Acquired: 08/01/2019 Comorbid Cataracts, Asthma, Chronic Obstructive Pulmonary Disease Weeks Of Treatment: 2 History: (COPD), Congestive Heart Failure, Coronary Artery Disease, Clustered Wound: No Hypertension, Peripheral Arterial Disease, Osteoarthritis, Confinement Anxiety Photos Photo Uploaded By: Mikeal Hawthorne on 11/27/2019 09:46:15 Wound Measurements Length: (cm) 0.5 Width: (cm) 0.4 Depth: (cm) 0.3 Area: (cm) 0.157 Volume: (cm) 0.047 % Reduction in Area: 28.6% % Reduction in Volume: -113.6% Epithelialization: None Tunneling: No Undermining: No Wound Description Classification: Category/Stage III Wound Margin: Epibole Exudate Amount: Small Exudate Type: Serous Exudate Color:  amber Foul Odor After Cleansing: No Slough/Fibrino Yes Wound Bed Granulation Amount: Medium (34-66%) Exposed Structure Granulation Quality: Red, Pink Fascia Exposed: No Necrotic Amount: Small (1-33%) Fat Layer (Subcutaneous Tissue) Exposed: Yes Necrotic Quality: Adherent Slough Tendon Exposed: No Muscle Exposed: No Joint Exposed: No Bone Exposed: No Treatment Notes Wound #15 (Sacrum) 1. Cleanse With Wound Cleanser 3. Primary Dressing Applied Collegen AG 4. Secondary Dressing Foam Border Dressing Electronic Signature(s) Signed: 11/25/2019 5:00:30 PM By: [name] RN Signed: 11/28/2019 12:02:47 PM By: Rhae Hammock RN Entered By: Rhae Hammock on 11/25/2019 14:38:27 -------------------------------------------------------------------------------- Vitals Details Patient Name: Date of Service: DO RN, [name] 11/25/2019 2:00  PM Medical Record Number: [id number] Patient Account Number: [id number] Date of Birth/Sex: Treating RN: 05/11/1929 (86 y.o. [name]) [name] Primary Care Janos Shampine: [name] Other Clinician: Referring Elson Ulbrich: Treating Carolin Quang/Extender: [name] in Treatment: 2 Vital Signs Time Taken: 14:20 Temperature (F): 98.5 Height (in): 69 Pulse (bpm): 96 Respiratory Rate (breaths/min): 18 Blood Pressure (mmHg): 177/79 Reference Range: 80 - 120 mg / dl Electronic Signature(s) Signed: 11/26/2019 11:19:23 AM By: [name] Entered By: [name] on 11/25/2019 14:21:03

## 2019-12-01 DIAGNOSIS — I5032 Chronic diastolic (congestive) heart failure: Secondary | ICD-10-CM | POA: Diagnosis not present

## 2019-12-01 DIAGNOSIS — I110 Hypertensive heart disease with heart failure: Secondary | ICD-10-CM | POA: Diagnosis not present

## 2019-12-01 DIAGNOSIS — I480 Paroxysmal atrial fibrillation: Secondary | ICD-10-CM | POA: Diagnosis not present

## 2019-12-01 DIAGNOSIS — I739 Peripheral vascular disease, unspecified: Secondary | ICD-10-CM | POA: Diagnosis not present

## 2019-12-01 DIAGNOSIS — L89622 Pressure ulcer of left heel, stage 2: Secondary | ICD-10-CM | POA: Diagnosis not present

## 2019-12-01 DIAGNOSIS — L89152 Pressure ulcer of sacral region, stage 2: Secondary | ICD-10-CM | POA: Diagnosis not present

## 2019-12-02 DIAGNOSIS — N401 Enlarged prostate with lower urinary tract symptoms: Secondary | ICD-10-CM | POA: Diagnosis not present

## 2019-12-02 DIAGNOSIS — R338 Other retention of urine: Secondary | ICD-10-CM | POA: Diagnosis not present

## 2019-12-03 DIAGNOSIS — I5032 Chronic diastolic (congestive) heart failure: Secondary | ICD-10-CM | POA: Diagnosis not present

## 2019-12-03 DIAGNOSIS — I480 Paroxysmal atrial fibrillation: Secondary | ICD-10-CM | POA: Diagnosis not present

## 2019-12-03 DIAGNOSIS — L89622 Pressure ulcer of left heel, stage 2: Secondary | ICD-10-CM | POA: Diagnosis not present

## 2019-12-03 DIAGNOSIS — I110 Hypertensive heart disease with heart failure: Secondary | ICD-10-CM | POA: Diagnosis not present

## 2019-12-03 DIAGNOSIS — L89152 Pressure ulcer of sacral region, stage 2: Secondary | ICD-10-CM | POA: Diagnosis not present

## 2019-12-03 DIAGNOSIS — I739 Peripheral vascular disease, unspecified: Secondary | ICD-10-CM | POA: Diagnosis not present

## 2019-12-05 DIAGNOSIS — I5032 Chronic diastolic (congestive) heart failure: Secondary | ICD-10-CM | POA: Diagnosis not present

## 2019-12-05 DIAGNOSIS — J9611 Chronic respiratory failure with hypoxia: Secondary | ICD-10-CM | POA: Diagnosis not present

## 2019-12-05 DIAGNOSIS — Z466 Encounter for fitting and adjustment of urinary device: Secondary | ICD-10-CM | POA: Diagnosis not present

## 2019-12-05 DIAGNOSIS — I480 Paroxysmal atrial fibrillation: Secondary | ICD-10-CM | POA: Diagnosis not present

## 2019-12-05 DIAGNOSIS — I110 Hypertensive heart disease with heart failure: Secondary | ICD-10-CM | POA: Diagnosis not present

## 2019-12-05 DIAGNOSIS — I2510 Atherosclerotic heart disease of native coronary artery without angina pectoris: Secondary | ICD-10-CM | POA: Diagnosis not present

## 2019-12-05 DIAGNOSIS — R339 Retention of urine, unspecified: Secondary | ICD-10-CM | POA: Diagnosis not present

## 2019-12-05 DIAGNOSIS — E871 Hypo-osmolality and hyponatremia: Secondary | ICD-10-CM | POA: Diagnosis not present

## 2019-12-05 DIAGNOSIS — N139 Obstructive and reflux uropathy, unspecified: Secondary | ICD-10-CM | POA: Diagnosis not present

## 2019-12-05 DIAGNOSIS — Z9981 Dependence on supplemental oxygen: Secondary | ICD-10-CM | POA: Diagnosis not present

## 2019-12-05 DIAGNOSIS — Z89611 Acquired absence of right leg above knee: Secondary | ICD-10-CM | POA: Diagnosis not present

## 2019-12-05 DIAGNOSIS — D649 Anemia, unspecified: Secondary | ICD-10-CM | POA: Diagnosis not present

## 2019-12-05 DIAGNOSIS — I739 Peripheral vascular disease, unspecified: Secondary | ICD-10-CM | POA: Diagnosis not present

## 2019-12-05 DIAGNOSIS — L89622 Pressure ulcer of left heel, stage 2: Secondary | ICD-10-CM | POA: Diagnosis not present

## 2019-12-05 DIAGNOSIS — R531 Weakness: Secondary | ICD-10-CM | POA: Diagnosis not present

## 2019-12-05 DIAGNOSIS — L89152 Pressure ulcer of sacral region, stage 2: Secondary | ICD-10-CM | POA: Diagnosis not present

## 2019-12-05 DIAGNOSIS — I87322 Chronic venous hypertension (idiopathic) with inflammation of left lower extremity: Secondary | ICD-10-CM | POA: Diagnosis not present

## 2019-12-05 DIAGNOSIS — R262 Difficulty in walking, not elsewhere classified: Secondary | ICD-10-CM | POA: Diagnosis not present

## 2019-12-07 DIAGNOSIS — I110 Hypertensive heart disease with heart failure: Secondary | ICD-10-CM | POA: Diagnosis not present

## 2019-12-07 DIAGNOSIS — L89622 Pressure ulcer of left heel, stage 2: Secondary | ICD-10-CM | POA: Diagnosis not present

## 2019-12-07 DIAGNOSIS — L89152 Pressure ulcer of sacral region, stage 2: Secondary | ICD-10-CM | POA: Diagnosis not present

## 2019-12-07 DIAGNOSIS — I480 Paroxysmal atrial fibrillation: Secondary | ICD-10-CM | POA: Diagnosis not present

## 2019-12-07 DIAGNOSIS — I739 Peripheral vascular disease, unspecified: Secondary | ICD-10-CM | POA: Diagnosis not present

## 2019-12-07 DIAGNOSIS — I5032 Chronic diastolic (congestive) heart failure: Secondary | ICD-10-CM | POA: Diagnosis not present

## 2019-12-08 ENCOUNTER — Encounter: Payer: Self-pay | Admitting: *Deleted

## 2019-12-08 ENCOUNTER — Other Ambulatory Visit: Payer: Self-pay

## 2019-12-08 ENCOUNTER — Other Ambulatory Visit: Payer: Self-pay | Admitting: *Deleted

## 2019-12-08 DIAGNOSIS — I5032 Chronic diastolic (congestive) heart failure: Secondary | ICD-10-CM | POA: Diagnosis not present

## 2019-12-08 DIAGNOSIS — I110 Hypertensive heart disease with heart failure: Secondary | ICD-10-CM | POA: Diagnosis not present

## 2019-12-08 DIAGNOSIS — I739 Peripheral vascular disease, unspecified: Secondary | ICD-10-CM | POA: Diagnosis not present

## 2019-12-08 DIAGNOSIS — L89622 Pressure ulcer of left heel, stage 2: Secondary | ICD-10-CM | POA: Diagnosis not present

## 2019-12-08 DIAGNOSIS — I480 Paroxysmal atrial fibrillation: Secondary | ICD-10-CM | POA: Diagnosis not present

## 2019-12-08 DIAGNOSIS — L89152 Pressure ulcer of sacral region, stage 2: Secondary | ICD-10-CM | POA: Diagnosis not present

## 2019-12-08 MED ORDER — FUROSEMIDE 20 MG PO TABS
ORAL_TABLET | ORAL | 3 refills | Status: DC
Start: 2019-12-08 — End: 2020-02-05

## 2019-12-08 NOTE — Patient Outreach (Signed)
[place] [facility]) Care Management  12/08/2019  JAVONE YBANEZ 1929-10-08 [id number]   CSW was able to make contact with patient's wife, Dempsey Ahonen today, to follow-up regarding social work services and resources for patient.  CSW was also able to confirm that Mrs. Minar has received calls from representatives with [employer], through [employer] [place], [employer], and [employer], through the [employer], with regards to the referrals that [facility] placed on patient's behalf through the [employer].  Mrs. Whistler indicated that she is currently able to transport patient to and from all of his physician appointments, and that she was just requesting transportation resources for future reference, in the event that she is unable to transport patient.  CSW reminded Mrs. Lundahl to notify CSW, at least 72 hours in advance, if she is in need of transportation services for patient, as CSW has agreed to place the referral and coordinate the transport.  Mrs. Scherger voiced understanding and was agreeable to this plan, confirming that she has the correct contact information for CSW.  CSW will perform a case closure on patient, as all goals of treatment have been met from social work standpoint and no additional social work needs have been identified at this time.  CSW will notify patient's RNCM with [facility], Valente Averey of CSW's plans to close patient's case.  CSW will fax an update to patient's Primary Care Physician, [name] to ensure that he is aware of CSW's involvement with patient's plan of care, in addition to sending a Physician Case Closure Letter.    [name], BSW, MSW, LCSW  Licensed Education officer, [facility]  Mailing [place] [address] [place], [place] [address] Physical Address-[address] [place], [place] [address] Toll Free Main # [phone] Fax # [phone] Cell # [phone]  [name]

## 2019-12-08 NOTE — Telephone Encounter (Signed)
Pt's medication was sent to pt's pharmacy as requested. Confirmation received.  °

## 2019-12-09 ENCOUNTER — Encounter (HOSPITAL_BASED_OUTPATIENT_CLINIC_OR_DEPARTMENT_OTHER): Payer: Medicare Other | Admitting: Internal Medicine

## 2019-12-09 ENCOUNTER — Other Ambulatory Visit: Payer: Self-pay

## 2019-12-09 DIAGNOSIS — I2510 Atherosclerotic heart disease of native coronary artery without angina pectoris: Secondary | ICD-10-CM | POA: Diagnosis not present

## 2019-12-09 DIAGNOSIS — L89153 Pressure ulcer of sacral region, stage 3: Secondary | ICD-10-CM | POA: Diagnosis not present

## 2019-12-09 DIAGNOSIS — I509 Heart failure, unspecified: Secondary | ICD-10-CM | POA: Diagnosis not present

## 2019-12-09 DIAGNOSIS — L89623 Pressure ulcer of left heel, stage 3: Secondary | ICD-10-CM | POA: Diagnosis not present

## 2019-12-09 DIAGNOSIS — I739 Peripheral vascular disease, unspecified: Secondary | ICD-10-CM | POA: Diagnosis not present

## 2019-12-09 DIAGNOSIS — I110 Hypertensive heart disease with heart failure: Secondary | ICD-10-CM | POA: Diagnosis not present

## 2019-12-09 NOTE — Progress Notes (Signed)
[name], [name] ([id number]) Visit Report for 12/09/2019 Arrival Information Details Patient Name: Date of Service: DO RN, [name] 12/09/2019 1:45 PM Medical Record Number: [id number] Patient Account Number: [id number] Date of Birth/Sex: Treating RN: [birth year] (86 y.o. [name] Primary Care Calle Schader: [name] Other Clinician: Referring Jamonica Schoff: Treating Brittny Spangle/Extender: [name] in Treatment: 4 Visit Information History Since Last Visit Added or deleted any medications: Yes Patient Arrived: Wheel Chair Had a fall or experienced change in Yes Arrival Time: 14:02 activities of daily living that may affect Accompanied By: wife risk of falls: Transfer Assistance: None Signs or symptoms of abuse/neglect since last visito No Patient Identification Verified: Yes Hospitalized since last visit: No Secondary Verification Process Completed: Yes Implantable device outside of the clinic excluding No Patient Requires Transmission-Based Precautions: No cellular tissue based products placed in the center Patient Has Alerts: Yes since last visit: Patient Alerts: L ABI N/C, TBI = .20 Has Dressing in Place as Prescribed: Yes Pain Present Now: No Electronic Signature(s) Signed: 12/09/2019 6:12:47 PM By: Levan Hurst RN, BSN Entered By: Levan Hurst on 12/09/2019 14:03:09 -------------------------------------------------------------------------------- Encounter Discharge Information Details Patient Name: Date of Service: DO RN, [name] 12/09/2019 1:45 PM Medical Record Number: [id number] Patient Account Number: [id number] Date of Birth/Sex: Treating RN: [name] (86 y.o. [name] Primary Care Kensli Bowley: [name] Other Clinician: Referring Kerilyn Cortner: Treating Devera Englander/Extender: [name] in Treatment: 4 Encounter Discharge Information Items Post Procedure Vitals Discharge Condition: Stable Temperature  (F): 98.3 Ambulatory Status: Wheelchair Pulse (bpm): 108 Discharge Destination: Home Respiratory Rate (breaths/min): 20 Transportation: Private Auto Blood Pressure (mmHg): 160/74 Accompanied By: wife Schedule Follow-up Appointment: Yes Clinical Summary of Care: Electronic Signature(s) Signed: 12/09/2019 5:54:32 PM By: [name] Entered By: [name] on 12/09/2019 15:26:10 -------------------------------------------------------------------------------- Lower Extremity Assessment Details Patient Name: Date of Service: DO RN, [name] 12/09/2019 1:45 PM Medical Record Number: [id number] Patient Account Number: [id number] Date of Birth/Sex: Treating RN: [name] (86 y.o. [name] Primary Care Marlow Berenguer: [name] Other Clinician: Referring Nereyda Bowler: Treating Hallelujah Wysong/Extender: [name] in Treatment: 4 Edema Assessment Assessed: [name]: No] [name]: No] Edema: [Left: Ye] [Right: s] Calf Left: Right: Point of Measurement: From Medial Instep 32.5 cm Ankle Left: Right: Point of Measurement: From Medial Instep 29.5 cm Vascular Assessment Pulses: Dorsalis Pedis Palpable: [Left:No] Electronic Signature(s) Signed: 12/09/2019 6:12:47 PM By: Levan Hurst RN, BSN Entered By: Levan Hurst on 12/09/2019 14:04:53 -------------------------------------------------------------------------------- Multi Wound Chart Details Patient Name: Date of Service: DO RN, [name] 12/09/2019 1:45 PM Medical Record Number: [id number] Patient Account Number: [id number] Date of Birth/Sex: Treating RN: [birth year] [name] (86 y.o. [name]) [name] Primary Care Delcenia Inman: [name] Other Clinician: Referring Chukwuemeka Artola: Treating Knut Rondinelli/Extender: [name] in Treatment: 4 Vital Signs Height(in): 69 Pulse(bpm): 108 Weight(lbs): Blood Pressure(mmHg): 160/74 Body Mass Index(BMI): Temperature(F): 98.3 Respiratory  Rate(breaths/min): 20 Photos: [14:No Photos Left, Lateral Calcaneus] [15:No Photos Sacrum] [N/A:N/A N/A] Wound Location: [14:Pressure Injury] [15:Pressure Injury] [N/A:N/A] Wounding Event: [14:Pressure Ulcer] [15:Pressure Ulcer] [N/A:N/A] Primary Etiology: [14:Cataracts, Asthma, Chronic] [15:Cataracts, Asthma, Chronic] [N/A:N/A] Comorbid History: [14:Obstructive Pulmonary Disease (COPD), Congestive Heart Failure, Coronary Artery Disease, Hypertension, Peripheral Arterial Disease, Osteoarthritis, Confinement Anxiety 08/01/2019] [15:Obstructive Pulmonary Disease (COPD), Congestive  Heart Failure, Coronary Artery Disease, Hypertension, Peripheral Arterial Disease, Osteoarthritis, Confinement Anxiety 08/01/2019] [N/A:N/A] Date Acquired: [14:4] [15:4] [N/A:N/A] Weeks of Treatment: [14:Healed - Epithelialized] [15:Open] [N/A:N/A] Wound Status: [14:0x0x0] [15:0.6x0.4x0.2] [N/A:N/A] Measurements L x W  x D (cm) [14:0] [15:0.188] [N/A:N/A] A (cm) : rea [14:0] [15:0.038] [N/A:N/A] Volume (cm) : [14:100.00%] [15:14.50%] [N/A:N/A] % Reduction in A [14:rea: 100.00%] [15:-72.70%] [N/A:N/A] % Reduction in Volume: [14:Category/Stage III] [15:Category/Stage III] [N/A:N/A] Classification: [14:None Present] [15:Small] [N/A:N/A] Exudate A mount: [14:N/A] [15:Serous] [N/A:N/A] Exudate Type: [14:N/A] [15:amber] [N/A:N/A] Exudate Color: [14:Flat and Intact] [15:Epibole] [N/A:N/A] Wound Margin: [14:None Present (0%)] [15:Small (1-33%)] [N/A:N/A] Granulation A mount: [14:N/A] [15:Pink, Pale] [N/A:N/A] Granulation Quality: [14:None Present (0%)] [15:Large (67-100%)] [N/A:N/A] Necrotic A mount: [14:Fascia: No] [15:Fat Layer (Subcutaneous Tissue): Yes N/A] Exposed Structures: [14:Fat Layer (Subcutaneous Tissue): No Tendon: No Muscle: No Joint: No Bone: No Large (67-100%)] [15:Fascia: No Tendon: No Muscle: No Joint: No Bone: No None] [N/A:N/A] Epithelialization: [14:N/A] [15:Debridement - Selective/Open Wound  N/A] Debridement: Pre-procedure Verification/Time Out N/A [15:14:57] [N/A:N/A] Taken: [14:N/A] [15:Slough] [N/A:N/A] Tissue Debrided: [14:N/A] [15:Skin/Epidermis] [N/A:N/A] Level: [14:N/A] [15:0.24] [N/A:N/A] Debridement A (sq cm): [14:rea N/A] [15:Curette] [N/A:N/A] Instrument: [14:N/A] [15:Moderate] [N/A:N/A] Bleeding: [14:N/A] [15:Pressure] [N/A:N/A] Hemostasis A chieved: [14:N/A] [15:0] [N/A:N/A] Procedural Pain: [14:N/A] [15:0] [N/A:N/A] Post Procedural Pain: [14:N/A] [15:Procedure was tolerated well] [N/A:N/A] Debridement Treatment Response: [14:N/A] [15:0.6x0.4x0.2] [N/A:N/A] Post Debridement Measurements L x W x D (cm) [14:N/A] [15:0.038] [N/A:N/A] Post Debridement Volume: (cm) [14:N/A] [15:Category/Stage III] [N/A:N/A] Post Debridement Stage: [14:N/A] [15:Debridement] [N/A:N/A] Treatment Notes Electronic Signature(s) Signed: 12/09/2019 5:54:56 PM By: [name] MD Signed: 12/09/2019 5:56:00 PM By: Travis Coria RN Entered By: [name] on 12/09/2019 15:16:28 -------------------------------------------------------------------------------- Tony Details Patient Name: Date of Service: DO RN, [name] 12/09/2019 1:45 PM Medical Record Number: [id number] Patient Account Number: [id number] Date of Birth/Sex: Treating RN: [birth year] [name] (86 y.o. [name] Primary Care Markese Bloxham: [name] Other Clinician: Referring Margherita [name]: Treating Jeiden Daughtridge/Extender: [name] in Treatment: 4 Active Inactive Wound/Skin Impairment Nursing Diagnoses: Knowledge deficit related to ulceration/compromised skin integrity Goals: Patient/caregiver will verbalize understanding of skin care regimen Date Initiated: 11/11/2019 Target Resolution Date: 12/11/2019 Goal Status: Active Ulcer/skin breakdown will have a volume reduction of 30% by week 4 Date Initiated: 11/11/2019 Target Resolution Date: 12/11/2019 Goal Status:  Active Interventions: Assess patient/caregiver ability to obtain necessary supplies Assess patient/caregiver ability to perform ulcer/skin care regimen upon admission and as needed Assess ulceration(s) every visit Notes: Electronic Signature(s) Signed: 12/09/2019 5:56:00 PM By: Travis Coria RN Entered By: [name] on 12/09/2019 14:33:24 -------------------------------------------------------------------------------- Pain Assessment Details Patient Name: Date of Service: DO RN, [name] 12/09/2019 1:45 PM Medical Record Number: [id number] Patient Account Number: [id number] Date of Birth/Sex: Treating RN: [birth year] (86 y.o. [name] Primary Care Cylis Ayars: [name] Other Clinician: Referring Partick Musselman: Treating Lachandra Dettmann/Extender: [name] in Treatment: 4 Active Problems Location of Pain Severity and Description of Pain Patient Has Paino Yes Site Locations Pain Location: Generalized Pain With Dressing Change: Yes Duration of the Pain. Constant / Intermittento Intermittent Rate the pain. Current Pain Level: 4 Character of Pain Describe the Pain: Aching Pain Management and Medication Current Pain Management: Medication: Yes Cold Application: No Rest: No Massage: No Activity: No T.E.N.S.: No Heat Application: No Leg drop or elevation: No Is the Current Pain Management Adequate: Adequate How does your wound impact your activities of daily livingo Sleep: No Bathing: No Appetite: No Relationship With Others: No Bladder Continence: No Emotions: No Bowel Continence: No Work: No Toileting: No Drive: No Dressing: No Hobbies: No Electronic Signature(s) Signed: 12/09/2019 6:12:47 PM By: Levan Hurst RN, BSN Entered By: Levan Hurst on 12/09/2019 14:05:26 -------------------------------------------------------------------------------- Patient/Caregiver Education Details Patient Name: Date  of Service: DO RN, [name] ID [name]  11/30/2021andnbsp1:45 PM Medical Record Number: [id number] Patient Account Number: [id number] Date of Birth/Gender: Treating RN: [birth year] [name] (86 y.o. [name]) [name] Primary Care Physician: [name] Other Clinician: Referring Physician: Treating Physician/Extender: [name] in Treatment: 4 Education Assessment Education Provided To: Patient Education Topics Provided Wound/Skin Impairment: Methods: Explain/Verbal Responses: State content correctly Electronic Signature(s) Signed: 12/09/2019 5:56:00 PM By: Travis Coria RN Entered By: [name] on 12/09/2019 14:33:52 -------------------------------------------------------------------------------- Wound Assessment Details Patient Name: Date of Service: DO RN, [name] 12/09/2019 1:45 PM Medical Record Number: [id number] Patient Account Number: [id number] Date of Birth/Sex: Treating RN: [birth year] [name] (86 y.o. [name] Primary Care Manvi Guilliams: [name] Other Clinician: Referring Mykale Gandolfo: Treating Keidan Aumiller/Extender: [name] in Treatment: 4 Wound Status Wound Number: 14 Primary Pressure Ulcer Etiology: Wound Location: Left, Lateral Calcaneus Wound Healed - Epithelialized Wounding Event: Pressure Injury Status: Date Acquired: 08/01/2019 Comorbid Cataracts, Asthma, Chronic Obstructive Pulmonary Disease Weeks Of Treatment: 4 History: (COPD), Congestive Heart Failure, Coronary Artery Disease, Clustered Wound: No Hypertension, Peripheral Arterial Disease, Osteoarthritis, Confinement Anxiety Wound Measurements Length: (cm) Width: (cm) Depth: (cm) Area: (cm) Volume: (cm) 0 % Reduction in Area: 100% 0 % Reduction in Volume: 100% 0 Epithelialization: Large (67-100%) 0 Tunneling: No 0 Undermining: No Wound Description Classification: Category/Stage III Wound Margin: Flat and Intact Exudate Amount: None Present Foul Odor After Cleansing:  No Slough/Fibrino No Wound Bed Granulation Amount: None Present (0%) Exposed Structure Necrotic Amount: None Present (0%) Fascia Exposed: No Fat Layer (Subcutaneous Tissue) Exposed: No Tendon Exposed: No Muscle Exposed: No Joint Exposed: No Bone Exposed: No Electronic Signature(s) Signed: 12/09/2019 5:56:00 PM By: Travis Coria RN Signed: 12/09/2019 6:12:47 PM By: Levan Hurst RN, BSN Entered By: [name] on 12/09/2019 14:56:42 -------------------------------------------------------------------------------- Wound Assessment Details Patient Name: Date of Service: DO RN, [name] 12/09/2019 1:45 PM Medical Record Number: [id number] Patient Account Number: [id number] Date of Birth/Sex: Treating RN: [name] (86 y.o. [name] Primary Care Amron Guerrette: [name] Other Clinician: Referring Francella Barnett: Treating Shenica Holzheimer/Extender: [name] in Treatment: 4 Wound Status Wound Number: 15 Primary Pressure Ulcer Etiology: Wound Location: Sacrum Wound Open Wounding Event: Pressure Injury Status: Date Acquired: 08/01/2019 Comorbid Cataracts, Asthma, Chronic Obstructive Pulmonary Disease Weeks Of Treatment: 4 History: (COPD), Congestive Heart Failure, Coronary Artery Disease, Clustered Wound: No Hypertension, Peripheral Arterial Disease, Osteoarthritis, Confinement Anxiety Wound Measurements Length: (cm) 0.6 Width: (cm) 0.4 Depth: (cm) 0.2 Area: (cm) 0.188 Volume: (cm) 0.038 % Reduction in Area: 14.5% % Reduction in Volume: -72.7% Epithelialization: None Tunneling: No Undermining: No Wound Description Classification: Category/Stage III Wound Margin: Epibole Exudate Amount: Small Exudate Type: Serous Exudate Color: amber Foul Odor After Cleansing: No Slough/Fibrino Yes Wound Bed Granulation Amount: Small (1-33%) Exposed Structure Granulation Quality: Pink, Pale Fascia Exposed: No Necrotic Amount: Large (67-100%) Fat Layer  (Subcutaneous Tissue) Exposed: Yes Necrotic Quality: Adherent Slough Tendon Exposed: No Muscle Exposed: No Joint Exposed: No Bone Exposed: No Treatment Notes Wound #15 (Sacrum) 1. Cleanse With Wound Cleanser 2. Periwound Care Skin Prep 3. Primary Dressing Applied Iodoflex 4. Secondary Dressing Foam Border Dressing 5. Secured With Self Adhesive Bandage Notes explained the new orders for the primary dressing. Electronic Signature(s) Signed: 12/09/2019 6:12:47 PM By: Levan Hurst RN, BSN Entered By: Levan Hurst on 12/09/2019 14:14:45 -------------------------------------------------------------------------------- Vitals Details Patient Name: Date of Service: DO RN, [name] 12/09/2019 1:45 PM Medical Record Number: [id number] Patient Account Number:  [id number] Date of Birth/Sex: Treating RN: [birth year] [name] (86 y.o. [name] Primary Care Kesley Mullens: [name] Other Clinician: Referring Rowe Warman: Treating Tesa Meadors/Extender: [name] in Treatment: 4 Vital Signs Time Taken: 14:04 Temperature (F): 98.3 Height (in): 69 Pulse (bpm): 108 Respiratory Rate (breaths/min): 20 Blood Pressure (mmHg): 160/74 Reference Range: 80 - 120 mg / dl Electronic Signature(s) Signed: 12/09/2019 6:12:47 PM By: Levan Hurst RN, BSN Entered By: Levan Hurst on 12/09/2019 14:04:33

## 2019-12-09 NOTE — Progress Notes (Signed)
[name], [name] ([id number]) Visit Report for 12/09/2019 Debridement Details Patient Name: Date of Service: DO RN, [name] 12/09/2019 1:45 PM Medical Record Number: [id number] Patient Account Number: [id number] Date of Birth/Sex: Treating RN: 1929-09-19 (86 y.o. [name]) [name] Primary Care Provider: [name] Other Clinician: Referring Provider: Treating Provider/Extender: [name] in Treatment: 4 Debridement Performed for Assessment: Wound #15 Sacrum Performed By: Physician Ricard Dillon., MD Debridement Type: Debridement Level of Consciousness (Pre-procedure): Awake and Alert Pre-procedure Verification/Time Out Yes - 14:57 Taken: Start Time: 14:57 T Area Debrided (L x W): otal 0.6 (cm) x 0.4 (cm) = 0.24 (cm) Tissue and other material debrided: Viable, Non-Viable, Slough, Subcutaneous, Skin: Dermis , Skin: Epidermis, Slough Level: Skin/Subcutaneous Tissue Debridement Description: Excisional Instrument: Curette Bleeding: Moderate Hemostasis Achieved: Pressure End Time: 14:59 Procedural Pain: 0 Post Procedural Pain: 0 Response to Treatment: Procedure was tolerated well Level of Consciousness (Post- Awake and Alert procedure): Post Debridement Measurements of Total Wound Length: (cm) 0.6 Stage: Category/Stage III Width: (cm) 0.4 Depth: (cm) 0.2 Volume: (cm) 0.038 Character of Wound/Ulcer Post Debridement: Improved Post Procedure Diagnosis Same as Pre-procedure Electronic Signature(s) Signed: 12/09/2019 5:54:56 PM By: Travis Ham MD Signed: 12/09/2019 5:56:00 PM By: Travis Coria RN Entered By: [name] on 12/09/2019 15:16:43 -------------------------------------------------------------------------------- HPI Details Patient Name: Date of Service: DO RN, [name] 12/09/2019 1:45 PM Medical Record Number: [id number] Patient Account Number: [id number] Date of Birth/Sex: Treating RN: 1929-12-03 (86 y.o. [name] Primary Care Provider: [name] Other Clinician: Referring Provider: Treating Provider/Extender: [name] in Treatment: 4 History of Present Illness HPI Description: 01/30/18 on evaluation today patient presents for initial inspection concerning the skin tear of the right forearm. Fortunately there does not appear to be any evidence of infection and this occurred approximately three days ago. He has been using Vaseline over the region. With that being said the patient does have a history of cataracts COPD, hypertension, peripheral arterial disease, and osteoarthritis. He tells me that he does have a little bit of pain at the site but nothing too significant at this time which is good news. Fortunately this overall appears to be doing fairly well which is good news. No fevers, chills, nausea, or vomiting noted at this time. The biggest issue at this point is that the wound is of quite significant size. READMISSION 04/21/2019 This is a 86 year old man with multiple medical problems who was seen once in the clinic here in January 2020 with a skin tear on his right forearm seen by [name]. This problem this time started sometime recently. He was noted by podiatry to have erosions and wounds of his right foot on toes 1-3. He also saw [name] of dermatology who gave him Silvadene cream for what I think was felt to be a stasis dermatitis of his bilateral lower legs. At some point he was referred to [name]. He was also noted to have previous noninvasive arterial studies that showed no flow to either one of his toes and noncompressible ABIs bilaterally. [name] did noninvasive arterial studies on him. These showed that he had greater than 75% stenosis in the proximal and mid common femoral artery. Monophasic flow in the posterior tibial and dorsalis pedis positions on the right foot. ABIs again were noncompressible. He was felt to have total occlusion of  the superficial femoral artery on the right. The overall interpretation is that he had severe multilevel arterial occlusive  disease. Absent femoral pulses and superficial femoral artery occlusions. In spite of this he was not felt to be candidate for arteriography given his bilateral femoral artery occlusions as well he would not be a candidate for endovascular approach secondary to his common femoral artery occlusions and proximal iliac disease. He was not felt to be a candidate for an open infra inguinal bypass. He therefore felt he would need to be monitored over time with the only option being a right below-knee amputation The patient complains currently of pain at night when he is up in bed. The pain in the right foot is better when he puts the leg down over the bed side. This is compatible with rest pain. He has very limited activity i.e. is even exhausted getting dressed in the morning because of cardiopulmonary issues therefore it is difficult to gauge claudication I believe he was referred here by Dr. [name] with one of the major issues is whether he would be a candidate for hyperbaric oxygen. He is not a diabetic. Dr. [name] did give him Silvadene cream for dry flaking skin in his bilateral lower extremities, however the patient is concerned about using this in the face of sulfa allergy. 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 This is on the medial aspect extended there is a second area on the plantar aspect. Most of what the patient talks about is claudication with minimal activity [however the patient is limited by his COPD], or even at rest at night. He is  not getting a lot of sleep. We use silver collagen on this wound 5/4; the patient has a wound on the tip of his right great toe also on the medial aspect. Setting of severe nonrevascularizable PAD [see description in HPI]. He does not describe any change in his pain. We have been using silver collagen 5/18; 2-week follow-up. The patient has wound on the tip of his right great toe as well as the medial aspect. He has developed new wounds x2 on the right lateral foot and the right lateral calcaneus. These look like ischemic areas. The patient talks about claudication at night. He seems to be more comfortable during the day. We have been using silver collagen to the wounds. I have previously debrided the eschar on the toes however this comes right back. 6/1; 2-week follow-up. This patient has ischemic wounds on his right foot in the setting of nonunreconstructable PAD [previously reviewed by Dr. Dixon]. He does not really describe severe pain but he does have episodic pain in the right great toe. He has an area on the tip of the right great toe as well as the medial aspect of the right great toe. A new area on the tip of the right fourth toe. He has the area on the right lateral foot fortunately the area on the right lateral heel appears to have epithelialized over. We are using silver collagen to all wounds 06/23/2019 upon evaluation today patient appears to be doing about the same in regard to his foot ulcers. Unfortunately he has 2 new skin tears on his left elbow and the right forearm that were new as of today. He states that it was doing okay until  they went to change the dressing and they put some collagen on it that got stuck and somewhat pulled back the skin unfortunately. Fortunately there is no signs of active infection at this time. No fevers, chills, nausea, vomiting, or diarrhea. 6/29; the patient was hospitalized from 6/18 through 6/23 going in with increasing drainage from the wounds on his  right foot. He underwent a angiogram on 06/30/2019 by Dr. Donzetta Matters which revealed multilevel arterial disease including occlusion of the right common femoral artery. He reconstitutes the profunda femoris and the below-knee popliteal artery and appears to have posterior tibial peroneal runoff to the ankle although there is heavy calcification of the popliteal below the knee. He was offered consideration of a right above-knee amputation, palliative and/or hospice care and consideration of a very high risk attempt at bypass. He did receive IV antibiotics. He comes in the clinic today again with the 2 areas that are necrotic on the left great toe and a small punched out area on the right lateral foot. A lot of the erythema on top of the right foot but no warmth. He has a lot of swelling here. The patient is literally tortured over this decision that he needs to make. I went over this with him in exceptional detail. I do not believe he is a candidate for any form of open bypass surgery. I think his vascular doctors would agree with that. He still has pain at night when he puts his leg up on the bed that is relieved by putting his leg dependent. This is not different from what I remember. He is not septic there is no additional skin breakdown that I can see 7/8 the patient continues to have severe pain in the right foot at night making it difficult for him to rest. There is increasing erythema in the right foot but the foot is cold. I do not think this is an infection I think this is likely to be tissue necrosis from ischemia. I thought he would have made an up decision today about going forward with hospice he has not. He did not go to see [name] on 7/6. Again he wants to talk to me at length about amputations. I do not think he will do well after an above-knee amputation on the right. Furthermore I do not think he is a candidate for bypass surgery on the right leg 7/20; patient still having a lot of pain in  the right foot. He saw Dr. Donzetta Matters on 7/16. He is not felt to be able to undergo a complex revascularization and I certainly agree with that. He was offered a right below-knee amputation but he has not made that decision. I have repetitively suggested hospice care if he will not undergo an amputation he is not decided he wants to go that route either. He arrives in the clinic today accompanied by his son who is a Stage manager and his wife. I think the patient has tissue breakdown in the dorsal foot secondary to ischemia although his son brought up that some of the erythema could be a contact dermatitis as they were apparently layering a large area of pure call over the wound area. I was not aware of this not supposed to be doing it in this fashion. In any case the erythema look better than last week and I am quite convinced this is not cellulitis READMISSION 11/11/2019 Mr. Paula Libra is now an 86 year old man who we had in this clinic from [place] through July/21 largely  as a result of ischemic wounds in his right foot. He was followed by vein and vascular [name]. His anatomy was not felt to be amenable to a revascularization. The patient had increasing ischemic pain but was very resistant to the notion of an amputation. Eventually he did go through with this on August 22 he will underwent an AKA by [name]. He went to rehabilitation. There he developed a small stage III lower sacral pressure ulcer and an area on his left lateral heel which is also probably a pressure ulcer. They have been using silver collagen on both wound areas. The patient states he is not in any unrelieved pain. He is in a wheelchair at home with a Roho cushion. He is able to do his own transfers. I think he is probably putting far too much pressure on these areas Past medical history is reviewed. He has a history of CHF, severe PAD, hypertension, coronary artery disease status post CABG, COPD Gold stage IV on O2, hypothyroidism and  a recent problem with urinary retention he now has a Foley catheter. He has had problems with left leg swelling and adjustment of his diuretics he is followed by Dr. Daneen Schick. The patient has severe PAD. On 06/30/2019 he had an angiogram on the remaining left side he had heavily diseased femoral artery. His superficial femoral artery was flush occluded with runoff in the profunda. He was felt his SFA was likely occluded and reconstitutes the popliteal artery via runoff via the peroneal and posterior tibial artery. As noted he is not in a lot of pain. His last noninvasive study was on 6/20. On the left side this was noncompressible ABI but a TBI of only 0.20 with dampened monophasic waveforms 11/25/2019. Patient has 2 wound areas 1 on the left lateral heel and a ulcer on his coccyx. These are probably all pressure related although he has significant PAD complicating any attempt to heal a wound on the left heel. We have been using silver collagen on the wound and the coccyx silver alginate on the heel. 11/30; miraculously the left lateral heel wound has healed. The area on his coccyx is not much better. We have been using Prisma He apparently went back to see [name]. His wife paraphrasing says that he had terrible blood flow in the left lower extremity but he is not a candidate for revascularization. Fortunately had enough blood flow to heal the wound I will try to look at Dr. Mee Hives note by the time these next year Electronic Signature(s) Signed: 12/09/2019 5:54:56 PM By: Travis Ham MD Entered By: [name] on 12/09/2019 15:17:34 -------------------------------------------------------------------------------- Physical Exam Details Patient Name: Date of Service: DO RN, [name] 12/09/2019 1:45 PM Medical Record Number: [id number] Patient Account Number: [id number] Date of Birth/Sex: Treating RN: 07-Oct-1929 (86 y.o. [name] Primary Care Provider: [name] Other  Clinician: Referring Provider: Treating Provider/Extender: [name] in Treatment: 4 Constitutional Patient is hypertensive.. Pulse regular and within target range for patient.Marland Kitchen Respirations regular, non-labored and within target range.. Temperature is normal and within the target range for the patient.Marland Kitchen Appears in no distress. Cardiovascular Pedal pulses palpable on the left. Notes Wound exam The left lateral heel is completely closed. I must admit to some surprised about this. The area over the lower coccyx still tender percent covered in necrotic subcutaneous debris which I removed with a #3 curette hemostasis with direct pressure Electronic Signature(s) Signed: 12/09/2019 5:54:56 PM By: Travis Ham  MD Entered By: [name] on 12/09/2019 15:18:32 -------------------------------------------------------------------------------- Physician Orders Details Patient Name: Date of Service: DO RN, [name] 12/09/2019 1:45 PM Medical Record Number: [id number] Patient Account Number: [id number] Date of Birth/Sex: Treating RN: 1929/05/08 (86 y.o. 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 Dressing Change Frequency Change Dressing every other day. Wound Cleansing Wound #15 Sacrum May shower and wash wound with soap and water. Primary Wound Dressing Wound #15 Sacrum Iodoflex Secondary Dressing Wound #15 [place] Wound #15 [place] [facility] for wound care. - [facility] Electronic Signature(s) Signed:  12/09/2019 5:54:56 PM By: Travis Ham MD Signed: 12/09/2019 5:56:00 PM By: Travis Coria RN Entered By: [name] on 12/09/2019 14:59:00 -------------------------------------------------------------------------------- Problem List Details Patient Name: Date of Service: DO RN, [name] 12/09/2019 1:45 PM Medical Record Number: [id number] Patient Account Number: [id number] Date of Birth/Sex: Treating RN: 05/15/1929 (86 y.o. 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 12/09/2019 1:45 PM Medical Record Number: [id number] Patient Account Number: [id number] Date of Birth/Sex: Treating RN: March 16, 1929 (86 y.o. [name] Primary Care Provider: [name] Other Clinician: Referring Provider: Treating Provider/Extender: [name] in Treatment: 4 Subjective History of Present Illness (HPI) 01/30/18 on evaluation today patient presents for initial inspection concerning the skin tear of the right forearm. Fortunately there does not appear to be any evidence of infection and this occurred approximately three days ago. He has been using Vaseline over the region. With that being said the patient  does have a history of cataracts COPD, hypertension, peripheral arterial disease, and osteoarthritis. He tells me that he does have a little bit of pain at the site but nothing too significant at this time which is good news. Fortunately this overall appears to be doing fairly well which is good news. No fevers, chills, nausea, or vomiting noted at this time. The biggest issue at this point is that the wound is of quite significant size. READMISSION 04/21/2019 This is a 86 year old man with multiple medical problems who was seen once in the clinic here in January 2020 with a skin tear on his right forearm seen by [name]. This problem this time started sometime recently. He was noted by podiatry to have erosions and wounds of his right foot on toes 1-3. He also saw [name] of dermatology who gave him Silvadene cream for what I think was felt to be a stasis dermatitis of his bilateral lower legs. At some point he was referred to [name]. He was also noted to have  previous noninvasive arterial studies that showed no flow to either one of his toes and noncompressible ABIs bilaterally. [name] did noninvasive arterial studies on him. These showed that he had greater than 75% stenosis in the proximal and mid common femoral artery. Monophasic flow in the posterior tibial and dorsalis pedis positions on the right foot. ABIs again were noncompressible. He was felt to have total occlusion of the superficial femoral artery on the right. The overall interpretation is that he had severe multilevel arterial occlusive disease. Absent femoral pulses and superficial femoral artery occlusions. In spite of this he was not felt to be candidate for arteriography given his bilateral femoral artery occlusions as well he would not be a candidate for endovascular approach secondary to his common femoral artery occlusions and proximal iliac disease. He was not felt to be a candidate for an open infra inguinal  bypass. He therefore felt he would need to be monitored over time with the only option being a right below-knee amputation The patient complains currently of pain at night when he is up in bed. The pain in the right foot is better when he puts the leg down over the bed side. This is compatible with rest pain. He has very limited activity i.e. is even exhausted getting dressed in the morning because of cardiopulmonary issues therefore it is difficult to gauge claudication I believe he was referred here by Dr. [name] with one of the major issues is whether he would be a candidate for hyperbaric oxygen. He is not a diabetic. Dr. [name] did give him Silvadene cream for dry flaking skin in his bilateral lower extremities, however the patient is concerned about using this in the face of sulfa allergy. 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 This is on the medial aspect extended there is a second area on the plantar aspect. Most of what the patient talks about is claudication with minimal activity [however the patient is limited by his COPD], or even at rest at night. He is not getting a lot of sleep. We use silver collagen on this wound 5/4; the patient has a wound on the tip of his right great toe also on the medial aspect. Setting of severe nonrevascularizable PAD [see description in HPI]. He does not describe any change in his pain. We have been using silver collagen 5/18; 2-week follow-up. The patient has wound on the tip of his right great toe as well as the medial aspect. He has developed new wounds x2 on the right lateral foot and the right lateral calcaneus. These  look like ischemic areas. The patient talks about claudication at night. He seems to be more comfortable during the day. We have been using silver collagen to the wounds. I have previously debrided the eschar on the toes however this comes right back. 6/1; 2-week follow-up. This patient has ischemic wounds on his right foot in the setting of nonunreconstructable PAD [previously reviewed by Dr. Dixon]. He does not really describe severe pain but he does have episodic pain in the right great toe. He has an area on the tip of the right great toe as well as the medial aspect of the right great toe.  A new area on the tip of the right fourth toe. He has the area on the right lateral foot fortunately the area on the right lateral heel appears to have epithelialized over. We are using silver collagen to all wounds 06/23/2019 upon evaluation today patient appears to be doing about the same in regard to his foot ulcers. Unfortunately he has 2 new skin tears on his left elbow and the right forearm that were new as of today. He states that it was doing okay until they went to change the dressing and they put some collagen on it that got stuck and somewhat pulled back the skin unfortunately. Fortunately there is no signs of active infection at this time. No fevers, chills, nausea, vomiting, or diarrhea. 6/29; the patient was hospitalized from 6/18 through 6/23 going in with increasing drainage from the wounds on his right foot. He underwent a angiogram on 06/30/2019 by Dr. Donzetta Matters which revealed multilevel arterial disease including occlusion of the right common femoral artery. He reconstitutes the profunda femoris and the below-knee popliteal artery and appears to have posterior tibial peroneal runoff to the ankle although there is heavy calcification of the popliteal below the knee. He was offered consideration of a right above-knee amputation, palliative and/or hospice care and consideration of a very high risk  attempt at bypass. He did receive IV antibiotics. He comes in the clinic today again with the 2 areas that are necrotic on the left great toe and a small punched out area on the right lateral foot. A lot of the erythema on top of the right foot but no warmth. He has a lot of swelling here. The patient is literally tortured over this decision that he needs to make. I went over this with him in exceptional detail. I do not believe he is a candidate for any form of open bypass surgery. I think his vascular doctors would agree with that. He still has pain at night when he puts his leg up on the bed that is relieved by putting his leg dependent. This is not different from what I remember. He is not septic there is no additional skin breakdown that I can see 7/8 the patient continues to have severe pain in the right foot at night making it difficult for him to rest. There is increasing erythema in the right foot but the foot is cold. I do not think this is an infection I think this is likely to be tissue necrosis from ischemia. I thought he would have made an up decision today about going forward with hospice he has not. He did not go to see [name] on 7/6. Again he wants to talk to me at length about amputations. I do not think he will do well after an above-knee amputation on the right. Furthermore I do not think he is a candidate for bypass surgery on the right leg 7/20; patient still having a lot of pain in the right foot. He saw Dr. Donzetta Matters on 7/16. He is not felt to be able to undergo a complex revascularization and I certainly agree with that. He was offered a right below-knee amputation but he has not made that decision. I have repetitively suggested hospice care if he will not undergo an amputation he is not decided he wants to go that route either. He arrives in the clinic today accompanied by his son who is a Stage manager and his wife. I think the patient has tissue breakdown in the dorsal foot  secondary to ischemia although his son brought up that some of the erythema could be a contact dermatitis as they were apparently layering a large area of pure call over the wound area. I was not aware of this not supposed to be doing it in this fashion. In any case the erythema look better than last week and I am quite convinced this is not cellulitis READMISSION 11/11/2019 Mr. Paula Libra is now an 86 year old man who we had in this clinic from [place] through July/21 largely as a result of ischemic wounds in his right foot. He was followed by vein and vascular [name]. His anatomy was not felt to be amenable to a revascularization. The patient had increasing ischemic pain but was very resistant to the notion of an amputation. Eventually he did go through with this on August 22 he will underwent an AKA by [name]. He went to rehabilitation. There he developed a small stage III lower sacral pressure ulcer and an area on his left lateral heel which is also probably a pressure ulcer. They have been using silver collagen on both wound areas. The patient states he is not in any unrelieved pain. He is in a wheelchair at home with a Roho cushion. He is able to do his own transfers. I think he is probably putting far too much pressure on these areas Past medical history is reviewed. He has a history of CHF, severe PAD, hypertension, coronary artery disease status post CABG, COPD Gold stage IV on O2, hypothyroidism and a recent problem with urinary retention he now has a Foley catheter. He has had problems with left leg swelling and adjustment of his diuretics he is followed by Dr. Daneen Schick. The patient has severe PAD. On 06/30/2019 he had an angiogram on the remaining left side he had heavily diseased femoral artery. His superficial femoral artery was flush occluded with runoff in the profunda. He was felt his SFA was likely occluded and reconstitutes the popliteal artery via runoff via the peroneal and  posterior tibial artery. As noted he is not in a lot of pain. His last noninvasive study was on 6/20. On the left side this was noncompressible ABI but a TBI of only 0.20 with dampened monophasic waveforms 11/25/2019. Patient has 2 wound areas 1 on the left lateral heel and a ulcer on his coccyx. These are probably all pressure related although he has significant PAD complicating any attempt to heal a wound on the left heel. We have been using silver collagen on the wound and the coccyx silver alginate on the heel. 11/30; miraculously the left lateral heel wound has healed. The area on his coccyx is not much better. We have been using Prisma He apparently went back to see [name]. His wife paraphrasing says that he had terrible blood flow in the left lower extremity but he is not a candidate for revascularization. Fortunately had enough blood flow to heal the wound I will try to look at Dr. Mee Hives note by the time these next year Objective Constitutional Patient is hypertensive.. Pulse regular and within target range for patient.Marland Kitchen Respirations regular, non-labored and within target range.. Temperature is normal and within the target range for the patient.Marland Kitchen Appears in no distress. Vitals Time Taken: 2:04 PM, Height: 69 in, Temperature: 98.3 F, Pulse: 108 bpm, Respiratory Rate: 20 breaths/min, Blood Pressure: 160/74 mmHg. Cardiovascular Pedal pulses palpable on the left. General Notes: Wound exam ooThe left lateral heel is completely closed. I must admit to some  surprised about this. ooThe area over the lower coccyx still tender percent covered in necrotic subcutaneous debris which I removed with a #3 curette hemostasis with direct pressure Integumentary (Hair, Skin) Wound #14 status is Healed - Epithelialized. Original cause of wound was Pressure Injury. The wound is located on the Left,Lateral Calcaneus. The wound measures 0cm length x 0cm width x 0cm depth; 0cm^2 area and 0cm^3 volume.  There is no tunneling or undermining noted. There is a none present amount of drainage noted. The wound margin is flat and intact. There is no granulation within the wound bed. There is no necrotic tissue within the wound bed. Wound #15 status is Open. Original cause of wound was Pressure Injury. The wound is located on the Sacrum. The wound measures 0.6cm length x 0.4cm width x 0.2cm depth; 0.188cm^2 area and 0.038cm^3 volume. There is Fat Layer (Subcutaneous Tissue) exposed. There is no tunneling or undermining noted. There is a small amount of serous drainage noted. The wound margin is epibole. There is small (1-33%) pink, pale granulation within the wound bed. There is a large (67-100%) amount of necrotic tissue within the wound bed including Adherent Slough. Assessment Active Problems ICD-10 Pressure ulcer of sacral region, stage 3 Acquired absence of right leg above knee Procedures Wound #15 Pre-procedure diagnosis of Wound #15 is a Pressure Ulcer located on the Sacrum . There was a Excisional Skin/Subcutaneous Tissue Debridement with a total area of 0.24 sq cm performed by Ricard Dillon., MD. With the following instrument(s): Curette to remove Viable and Non-Viable tissue/material. Material removed includes Subcutaneous Tissue, Slough, Skin: Dermis, and Skin: Epidermis. No specimens were taken. A time out was conducted at 14:57, prior to the start of the procedure. A Moderate amount of bleeding was controlled with Pressure. The procedure was tolerated well with a pain level of 0 throughout and a pain level of 0 following the procedure. Post Debridement Measurements: 0.6cm length x 0.4cm width x 0.2cm depth; 0.038cm^3 volume. Post debridement Stage noted as Category/Stage III. Character of Wound/Ulcer Post Debridement is improved. Post procedure Diagnosis Wound #15: Same as Pre-Procedure Plan Follow-up Appointments: Return Appointment in 2 weeks. Dressing Change Frequency: Change  Dressing every other day. Wound Cleansing: Wound #15 Sacrum: May shower and wash wound with soap and water. Primary Wound Dressing: Wound #15 Sacrum: Iodoflex Secondary Dressing: Wound #15 Sacrum: Foam [facility]: Wound #15 Sacrum: [facility] for [facility] - [facility] 1. I change the primary dressing on the lower coccyx to Iodoflex. Attempted debridement change every 2 2. Miraculously in spite of the severe PAD his left lateral heel wound has closed. 3. We will need to look at Dr. Mee Hives notes about the left leg but his wife states his tone was not optimistic at all. No revascularization Electronic Signature(s) Signed: 12/09/2019 5:54:56 PM By: Travis Ham MD Entered By: [name] on 12/09/2019 15:20:32 -------------------------------------------------------------------------------- SuperBill Details Patient Name: Date of Service: DO RN, [name] 12/09/2019 Medical Record Number: [id number] Patient Account Number: [id number] Date of Birth/Sex: Treating RN: 01-26-1929 (86 y.o. [name] Primary Care Provider: [name] Other Clinician: Referring Provider: Treating Provider/Extender: [name] in Treatment: 4 Diagnosis Coding ICD-10 Codes Code Description [phone] Pressure ulcer of sacral region, stage 3 Z89.611 Acquired absence of right leg above knee Facility Procedures CPT4 Code: [id number] Description: 86767 - DEB SUBQ TISSUE 20 SQ CM/< ICD-10 Diagnosis Description L89.153 Pressure ulcer of sacral region, stage 3 Z89.611 Acquired absence  of right leg above knee Modifier: Quantity: 1 Physician Procedures Electronic Signature(s) Signed: 12/09/2019 5:54:56 PM By: Travis Ham MD Entered By: [name] on 12/09/2019 15:20:54

## 2019-12-09 NOTE — Progress Notes (Signed)
Travis, [name] ([id number]) Visit Report for 12/09/2019 Fall Risk Assessment Details Patient Name: Date of Service: DO RN, [name] 12/09/2019 1:45 PM Medical Record Number: [id number] Patient Account Number: [id number] Date of Birth/Sex: Treating RN: 07/12/1929 (86 y.o. [name] Primary Care [name]: [name] Other Clinician: Referring Neyra Pettie: Treating Alexsia Klindt/Extender: [name] in Treatment: 4 Fall Risk Assessment Items Have you had 2 or more falls in the last 12 monthso 0 No Have you had any fall that resulted in injury in the last 12 monthso 0 No FALLS RISK SCREEN History of falling - immediate or within 3 months 25 Yes Secondary diagnosis (Do you have 2 or more medical diagnoseso) 15 Yes Ambulatory aid None/bed rest/wheelchair/nurse 0 Yes Crutches/cane/walker 0 No Furniture 0 No Intravenous therapy Access/Saline/Heparin Lock 0 No Gait/Transferring Normal/ bed rest/ wheelchair 0 No Weak (short steps with or without shuffle, stooped but able to lift head while walking, may seek 10 Yes support from furniture) Impaired (short steps with shuffle, may have difficulty arising from chair, head down, impaired 0 No balance) Mental Status Oriented to own ability 0 Yes Electronic Signature(s) Signed: 12/09/2019 6:12:47 PM By: Levan Hurst RN, BSN Entered By: Levan Hurst on 12/09/2019 14:14:03

## 2019-12-10 IMAGING — DX DG CHEST 2V
2 series · 2 of 2 positions shown · non-contrast
Comparison: PA and lateral chest x-ray November 09, 2017

CLINICAL DATA: 2-3 days of severe lethargy and increase shortness
of breath. History of previous CABG, COPD, peripheral vascular
disease, former smoker.

EXAM:
CHEST - 2 VIEW

[chest pa]
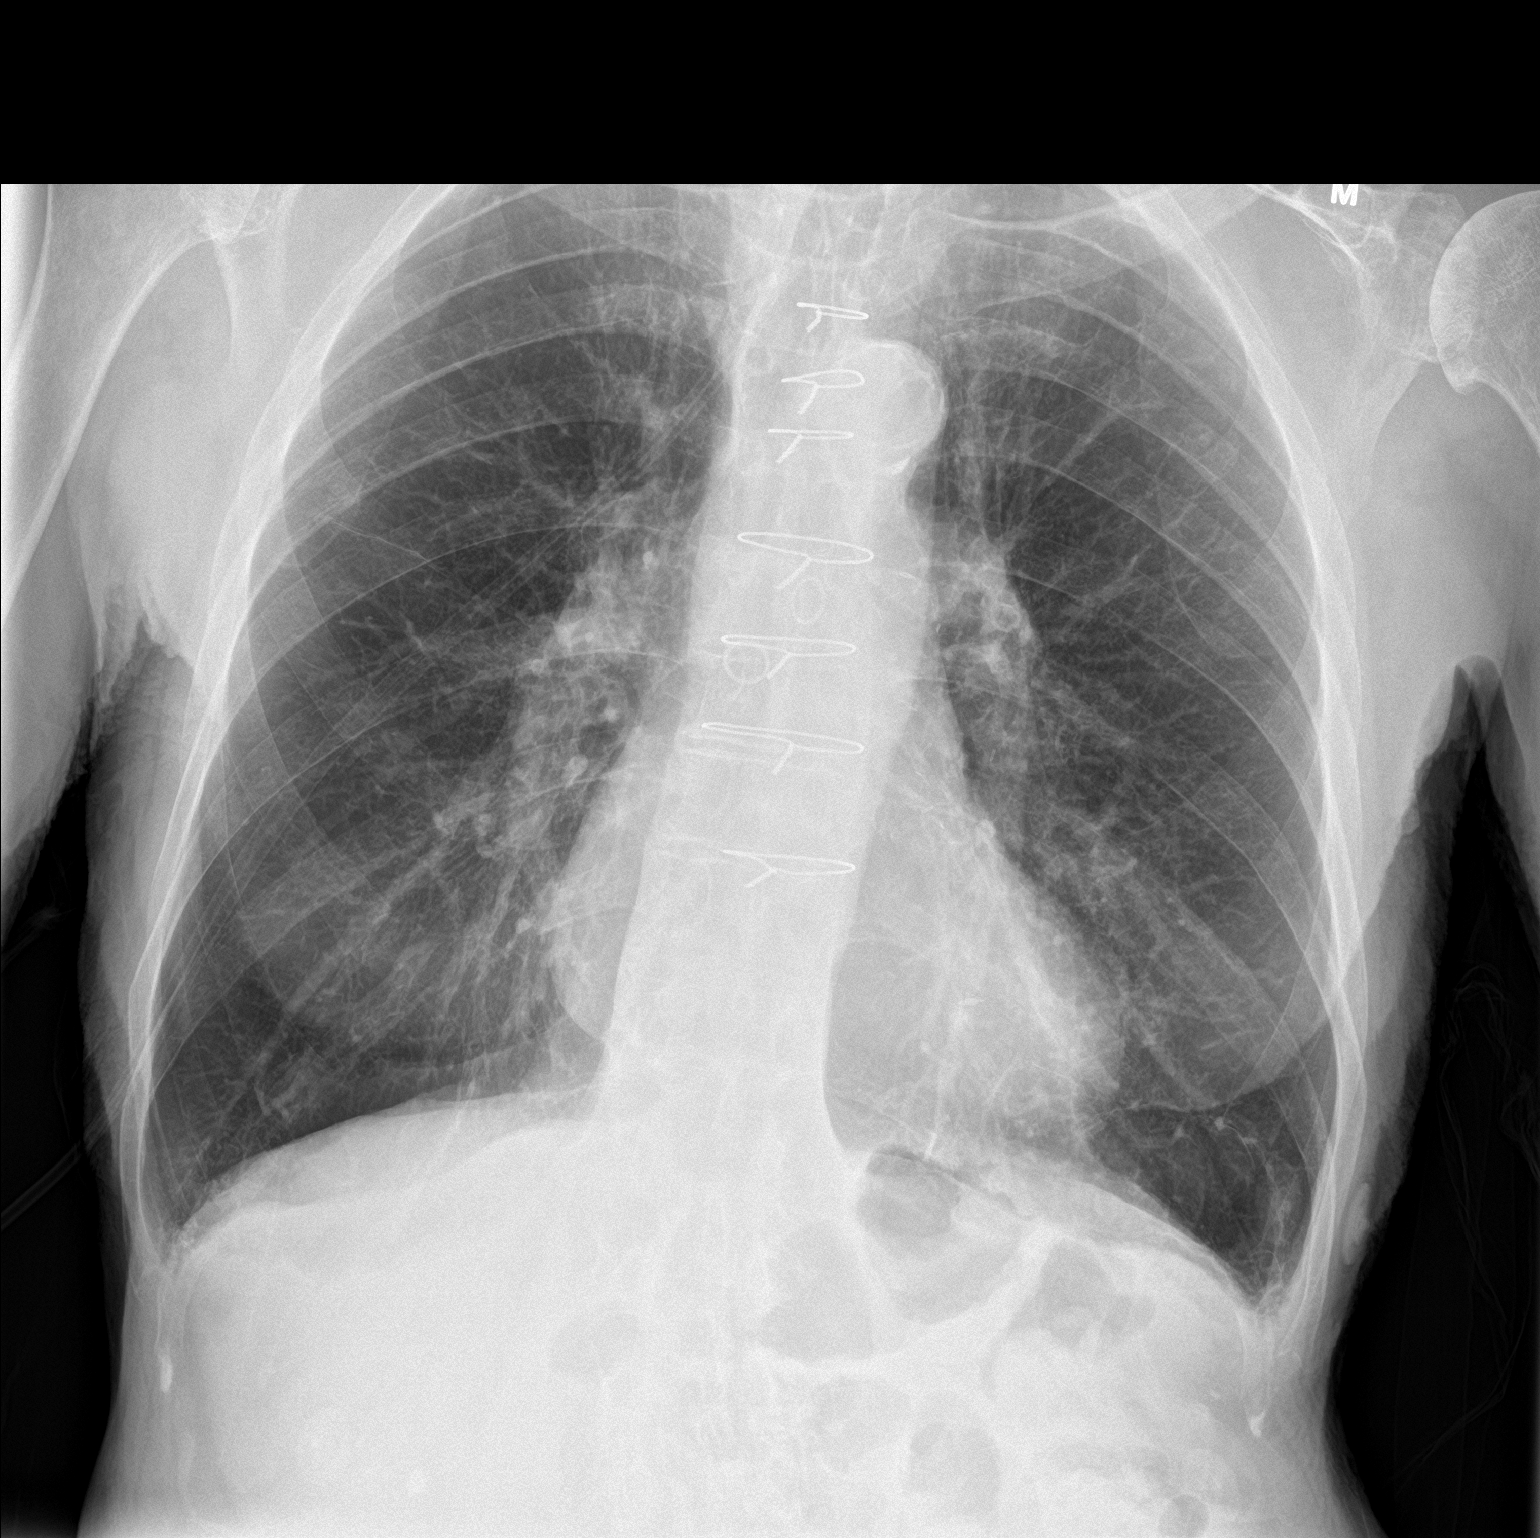

[chest lat]
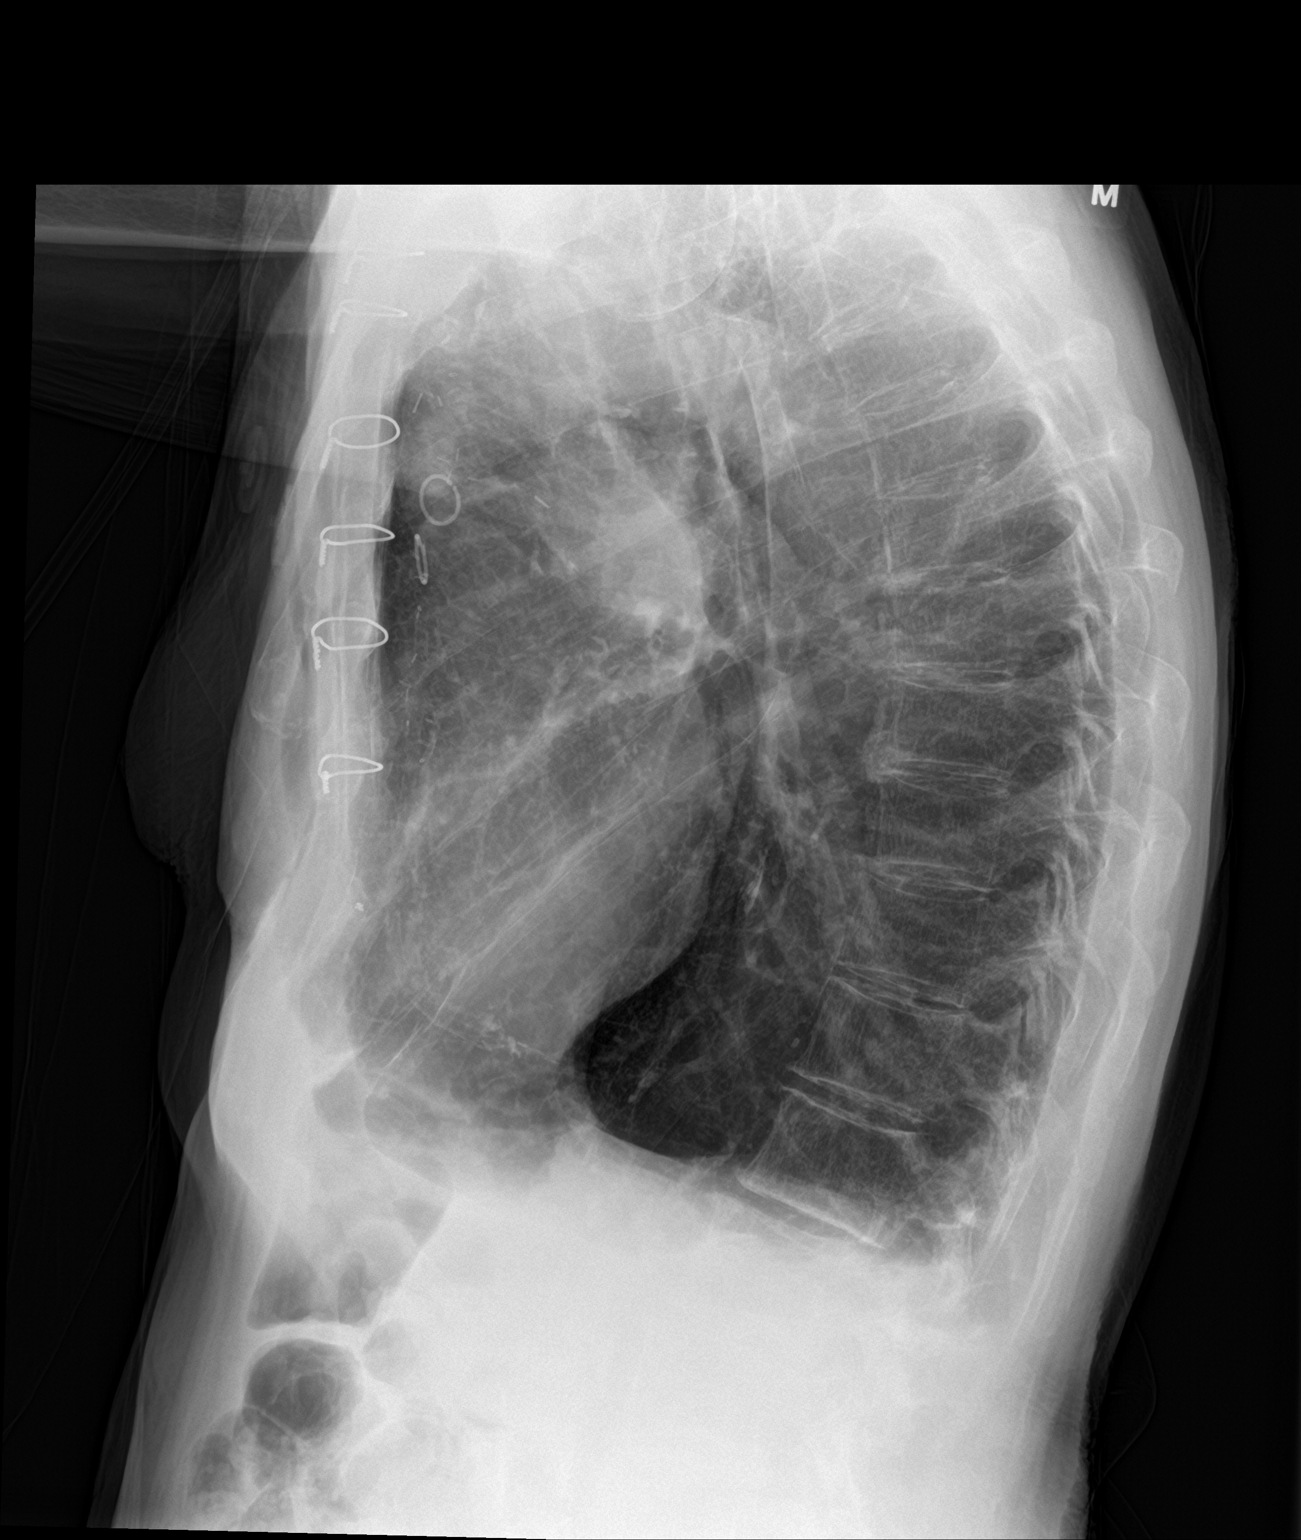

[2 of 2 positions shown; findings below may reference images not displayed]

FINDINGS: The lungs are hyperinflated with hemidiaphragm flattening. There is
no focal infiltrate. There is no pleural effusion. The heart and
pulmonary vascularity are normal. The mediastinum is normal in
width. There are post CABG changes. There is calcification in the
wall of the aortic arch. The bony thorax exhibits no acute
abnormality. There are degenerative changes of the left shoulder.
IMPRESSION: COPD. No pneumonia, CHF, nor other acute cardiopulmonary
abnormality. Previous CABG

## 2019-12-12 DIAGNOSIS — R338 Other retention of urine: Secondary | ICD-10-CM | POA: Diagnosis not present

## 2019-12-16 ENCOUNTER — Other Ambulatory Visit: Payer: Self-pay

## 2019-12-16 ENCOUNTER — Encounter: Payer: Medicare Other | Attending: Physical Medicine & Rehabilitation | Admitting: Physical Medicine & Rehabilitation

## 2019-12-16 ENCOUNTER — Encounter: Payer: Self-pay | Admitting: Physical Medicine & Rehabilitation

## 2019-12-16 VITALS — BP 116/73 | HR 73 | Temp 98.0°F | Ht 68.0 in

## 2019-12-16 DIAGNOSIS — I779 Disorder of arteries and arterioles, unspecified: Secondary | ICD-10-CM

## 2019-12-16 DIAGNOSIS — G8918 Other acute postprocedural pain: Secondary | ICD-10-CM | POA: Insufficient documentation

## 2019-12-16 DIAGNOSIS — Z9981 Dependence on supplemental oxygen: Secondary | ICD-10-CM | POA: Diagnosis not present

## 2019-12-16 DIAGNOSIS — F32A Depression, unspecified: Secondary | ICD-10-CM | POA: Insufficient documentation

## 2019-12-16 DIAGNOSIS — I5022 Chronic systolic (congestive) heart failure: Secondary | ICD-10-CM | POA: Insufficient documentation

## 2019-12-16 DIAGNOSIS — R269 Unspecified abnormalities of gait and mobility: Secondary | ICD-10-CM | POA: Insufficient documentation

## 2019-12-16 DIAGNOSIS — Z89611 Acquired absence of right leg above knee: Secondary | ICD-10-CM | POA: Insufficient documentation

## 2019-12-16 NOTE — Progress Notes (Signed)
Subjective:    Patient ID: [name], male    DOB: 1929/12/06, 86 y.o.   MRN: [id number]  TELEHEALTH NOTE  Due to national recommendations of social distancing due to COVID 19, an audio/video telehealth visit is felt to be most appropriate for this patient at this time.  See Chart message from today for the patient's consent to telehealth from [place].     I verified that I am speaking with the correct person using two identifiers.  Location of patient: Home Location of provider: Office Method of communication: MyChart video Names of participants : Zorita Pang scheduling, [place] Scrape obtaining consent and vitals if available Established patient  HPI Male with history of COPD with chronic hypoxic respiratory failure and oxygen dependent, BPH--Foley in place for retention, PAF, CAD, PVD presents for follow up for right AKA with multiple medical problems.  Wife provides history. Last clinic visit 11/20/2019.  Wife supplements history.  Since that time, patient is still in therapies. He notes difficulty breathing and depression. He states he was told by Vascular he did not require stump shrinker due to lack of edema. He was told also not to wear compression hose by vascular, but told by Cards to wear one.  He states he does not have a follow up with Cards.  He stopped taking Lyrica because he read the warnings and became scared.  He later states he tried it and felt worse. He continues to use foley, now with spasms.  He was started on Mybetriq by Urology. Denies falls.  Pain Inventory Average Pain 5 Pain Right Now 7 My pain is intermittent, sharp, burning, dull, stabbing, tingling and aching  In the last 24 hours, has pain interfered with the following? General activity 4 Relation with others 4 Enjoyment of life 6 What TIME of day is your pain at its worst? varies Sleep (in general) Good  to poor   Pain is worse with: sitting, some activites  and movement Pain improves with: rest, medication and Tylenol Relief from Meds: 4  use a wheelchair transfers alone Do you have any goals in this area?  yes  not employed: date last employed 1989 I need assistance with the following:  dressing, bathing, meal prep, household duties and shopping Do you have any goals in this area?  yes  bladder control problems weakness numbness trouble walking depression anxiety  Any changes since last visit?  no  Any changes since last visit?  no    Family History  Problem Relation Age of Onset  . Heart disease Mother   . Heart disease Father        Before age 43  . Breast cancer Mother   . Cancer Mother        Breast cancer  . Hypertension Mother   . Other Mother        AAA  and   Amputation  . Heart attack Father   . Stroke Brother        x3, still living   . Peripheral vascular disease Brother   . Heart disease Brother   . Hyperlipidemia Brother   . Hypertension Brother   . Heart disease Brother   . Allergies Brother    Social History   Socioeconomic History  . Marital status: Married    Spouse name: [name]  . Number of children: 2  . Years of education: 41  . Highest education level: 12th grade  Occupational History  .  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 Smoking status: Former Smoker    Packs/day: 2.00    Years: 33.00    Pack years: 66.00    Types: Cigarettes    Quit date: 03/25/1982    Years since quitting: 37.7  . Smokeless tobacco: Never Used  Vaping Use  . Vaping Use: Never used  Substance and Sexual Activity  . Alcohol use: Yes    Alcohol/week: 0.0 standard drinks    Comment: 1.5-3 oz daily in the past, minimal use in the last few months  . Drug use: No  . Sexual activity: Not Currently  Other Topics Concern  . Not on file  Social History Narrative   ** Merged History Encounter **       Pt lives at home with his spouse.         Social  Determinants of Health   Financial Resource Strain: Low Risk   . Difficulty of Paying Living Expenses: Not hard at all  Food Insecurity: No Food Insecurity  . Worried About Charity fundraiser in the Last Year: Never true  . Ran Out of Food in the Last Year: Never true  Transportation Needs: No Transportation Needs  . Lack of Transportation (Medical): No  . Lack of Transportation (Non-Medical): No  Physical Activity: Inactive  . Days of Exercise per Week: 0 days  . Minutes of Exercise per Session: 0 min  Stress: No Stress Concern Present  . Feeling of Stress : Only a little  Social Connections: Moderately Integrated  . Frequency of Communication with Friends and Family: More than three times a week  . Frequency of Social Gatherings with Friends and Family: More than three times a week  . Attends Religious Services: More than 4 times per year  . Active Member of Clubs or Organizations: No  . Attends Archivist Meetings: Never  . Marital Status: Married   Past Surgical History:  Procedure Laterality Date  . ABDOMINAL AORTOGRAM W/LOWER EXTREMITY Bilateral 06/30/2019   Procedure: ABDOMINAL AORTOGRAM W/LOWER EXTREMITY;  Surgeon: Waynetta Sandy, MD;  Location: [facility];  Service: Cardiovascular;  Laterality: Bilateral;  . AMPUTATION Right 08/11/2019   Procedure: RIGHT ABOVE KNEE AMPUTATION;  Surgeon: Angelia Mould, MD;  Location: [facility];  Service: Vascular;  Laterality: Right;  . BASAL CELL CARCINOMA EXCISION Left    ear  . CARDIAC CATHETERIZATION     "prior to bypass"  . CATARACT EXTRACTION W/ INTRAOCULAR LENS  IMPLANT, BILATERAL Bilateral   . CORONARY ARTERY BYPASS GRAFT  10/2001   LIMA to LAD, SVG to OM1-2, SVG to RCA and PDA  . LITHOTRIPSY    . PROSTATE BIOPSY  Oct. 2014  . TONSILLECTOMY     Past Medical History:  Diagnosis Date  . Anxiety   . Arthritis    "generalized" (04/04/2017)  . Asthma   . Basal cell carcinoma    "left ear"  .  BPH (benign prostatic hyperplasia)    severe; s/p multiple biopsies  . CAD (coronary artery disease)   . Carotid artery occlusion   . Chronic respiratory failure ([facility])   . Chronic rhinitis   . COPD (chronic obstructive pulmonary disease) (H[facility])    2L [place] O2  . Diastolic heart failure ([facility]) 2019  . Dilation of biliary tract   . Elevated troponin 03/09/2017  . Gallstones   . GERD (gastroesophageal reflux disease)   . History of blood transfusion    "  w/his CABG" (04/04/2017)  . History of kidney stones   . Hyperlipidemia   . Hypertension   . On home oxygen therapy    "~ 24/7" (04/04/2017)  . Peripheral vascular disease ([facility])   . Pneumonia 2019  . Squamous cell carcinoma of skin 04/23/2013   in situ-Right flank (txpbx)  . Squamous cell carcinoma of skin 01/23/2017   [place]/ in siut- Left ear rim (CX3FU)  . Syncope and collapse   . Ureteral tumor 08/2015   had endoscopic procedure for evaluation, unable to reach for biopsy  . Urinary catheter (Foley) change required    BP 116/73 Comment: PER WIFE VIDEO VISIT  Pulse 73   Temp 98 F (36.7 C)   Ht 5\' 8"  (1.727 m)   SpO2 95%   BMI 20.37 kg/m   Opioid Risk Score:   Fall Risk Score:  `1  Depression screen PHQ 2/9  Depression screen [facility] 2/9 12/16/2019 11/20/2019 11/20/2019 10/20/2019  Decreased Interest 1 1 1 2   Down, Depressed, Hopeless 1 0 0 2  PHQ - 2 Score 2 1 1 4   Altered sleeping - - - 3  Tired, decreased energy - - - 3  Change in appetite - - - 0  Feeling bad or failure about yourself  - - - 1  Trouble concentrating - - - 1  Moving slowly or fidgety/restless - - - 2  Suicidal thoughts - - - 0  PHQ-9 Score - - - 14  Some recent data might be hidden   Review of Systems  Constitutional: Negative.   HENT: Positive for sinus pain. Negative for postnasal drip.        Ear blockage   Eyes: Negative.   Respiratory: Positive for shortness of breath.        Copd  Cardiovascular: Positive for leg swelling.  Endocrine:  Negative.   Genitourinary: Positive for difficulty urinating.       Foley cath. Urine retention  Musculoskeletal: Positive for arthralgias and gait problem. Negative for neck pain.       Stump pain Left leg numbness Right foot pain Shoulder pain   Skin: Positive for wound.  Neurological: Positive for dizziness, tremors and weakness.       Vertigo  Hematological: Bruises/bleeds easily.  All other systems reviewed and are negative.     Objective:   Physical Exam  Constitutional: No distress . Vital signs reviewed. Frail.  HENT: Normocephalic.  Atraumatic. Eyes: EOMI. No discharge. Respiratory: DOE. No stridor.  +[place]. GI: Non-distended.  Psych: Slowed. Normal behavior. Neuro: Alert [place] HOH     Assessment & Plan:  Male with history of COPD with chronic hypoxic respiratory failure and oxygen dependent, BPH--Foley in place for retention, PAF, CAD, PVD presents for follow up for right AKA with multiple medical problems.  1.  Impaired mobility and ADLs secondary to right AKA with significant medical comorbidities             Continue therapies  Continue follow up with Vascular  Told by Vascular stump shrinker not necessary, but may use for pain   2. Phantom limb pain  Unable to tolerate Gabapentin 100 BID due to lethargy, d/ced - may consider resuming qhs for sleep as well  Stopped taking Lyrica 50 BID due to concerns for side effects  3. COPD/chronic hypoxic respiratory failure:   Continue supplemental oxygen  4. BPH/renal calculi with spasms:              Continue foley, unable to  wean              Meds per urology  5. CAD/Chronic systolic CHF:   Cont meds  Encouraged Cards follow up  6. Gait abnormality  Continue wheelchair for safety.  Continue therapies  7. Depression  Will make referral to Neuropsych  Meds per PCP, encouraged follow up  8. Orthostatsis   Encouraged follow up with Cards regarding compression hose to LLE (told by Vascular limited blood  flow)  Trial abdominal binder   >40 minutes spent in total in reviewing records and discussing aforementioned with patient.

## 2019-12-17 ENCOUNTER — Other Ambulatory Visit: Payer: Self-pay | Admitting: *Deleted

## 2019-12-17 DIAGNOSIS — L89152 Pressure ulcer of sacral region, stage 2: Secondary | ICD-10-CM | POA: Diagnosis not present

## 2019-12-17 DIAGNOSIS — I480 Paroxysmal atrial fibrillation: Secondary | ICD-10-CM | POA: Diagnosis not present

## 2019-12-17 DIAGNOSIS — I739 Peripheral vascular disease, unspecified: Secondary | ICD-10-CM | POA: Diagnosis not present

## 2019-12-17 DIAGNOSIS — I110 Hypertensive heart disease with heart failure: Secondary | ICD-10-CM | POA: Diagnosis not present

## 2019-12-17 DIAGNOSIS — I5032 Chronic diastolic (congestive) heart failure: Secondary | ICD-10-CM | POA: Diagnosis not present

## 2019-12-17 DIAGNOSIS — L89622 Pressure ulcer of left heel, stage 2: Secondary | ICD-10-CM | POA: Diagnosis not present

## 2019-12-17 NOTE — Patient Outreach (Signed)
[facility]) Care Management  12/17/2019  MAYS PAINO [birth year] [id number]   Notified by care management assistant that member's wife called main office with questions about services.  Call placed to wife, no answer, HIPAA compliant voice message left.  Will follow up within the next 3-4 business days.  Valente Yasin, [place], MSN [place] [phone]

## 2019-12-18 ENCOUNTER — Ambulatory Visit: Payer: Medicare Other | Admitting: Physical Medicine & Rehabilitation

## 2019-12-18 ENCOUNTER — Telehealth: Payer: Self-pay

## 2019-12-18 NOTE — Telephone Encounter (Signed)
Patient called c/o pain in stump. It is sharp and very uncomfortable. Denies any open wounds. Patient unable to tolerate Lyrica or Gabapentin prescribed by Dr. Posey Pronto. Wife is wondering if codeine will help. Patient does not want any side effects of "drowsy feeling or confusion". Advised to call Posey Pronto if they would like to try another pain medication. Patient can try to elevate stump if that helps - has f/u appt with our office next week.

## 2019-12-19 ENCOUNTER — Ambulatory Visit: Payer: Medicare Other | Admitting: *Deleted

## 2019-12-19 DIAGNOSIS — I110 Hypertensive heart disease with heart failure: Secondary | ICD-10-CM | POA: Diagnosis not present

## 2019-12-19 DIAGNOSIS — L89622 Pressure ulcer of left heel, stage 2: Secondary | ICD-10-CM | POA: Diagnosis not present

## 2019-12-19 DIAGNOSIS — I739 Peripheral vascular disease, unspecified: Secondary | ICD-10-CM | POA: Diagnosis not present

## 2019-12-19 DIAGNOSIS — I480 Paroxysmal atrial fibrillation: Secondary | ICD-10-CM | POA: Diagnosis not present

## 2019-12-19 DIAGNOSIS — L89152 Pressure ulcer of sacral region, stage 2: Secondary | ICD-10-CM | POA: Diagnosis not present

## 2019-12-19 DIAGNOSIS — I5032 Chronic diastolic (congestive) heart failure: Secondary | ICD-10-CM | POA: Diagnosis not present

## 2019-12-22 ENCOUNTER — Other Ambulatory Visit: Payer: Self-pay | Admitting: *Deleted

## 2019-12-22 ENCOUNTER — Telehealth: Payer: Self-pay | Admitting: *Deleted

## 2019-12-22 NOTE — Patient Outreach (Signed)
[facility]) Care Management  12/22/2019  [name] 08-31-1929 [id number]   Outreach attempt #2 to member's wife, successful but state she is at the [place] office and unable to talk.  Request call back when she is available.  If no call back, will follow up within the next 3-4 business days.  Valente Christoher, [place], MSN [place] [phone]

## 2019-12-22 NOTE — Telephone Encounter (Signed)
Called and left a voicemail with patient's wife, [name], to schedule a Palliative care home visit. Contact information left for return call.

## 2019-12-23 ENCOUNTER — Other Ambulatory Visit: Payer: Self-pay

## 2019-12-23 ENCOUNTER — Encounter (HOSPITAL_BASED_OUTPATIENT_CLINIC_OR_DEPARTMENT_OTHER): Payer: Medicare Other | Attending: Internal Medicine | Admitting: Internal Medicine

## 2019-12-23 DIAGNOSIS — L89153 Pressure ulcer of sacral region, stage 3: Secondary | ICD-10-CM | POA: Insufficient documentation

## 2019-12-23 DIAGNOSIS — Z89611 Acquired absence of right leg above knee: Secondary | ICD-10-CM | POA: Insufficient documentation

## 2019-12-24 ENCOUNTER — Ambulatory Visit (INDEPENDENT_AMBULATORY_CARE_PROVIDER_SITE_OTHER): Payer: Medicare Other | Admitting: Physician Assistant

## 2019-12-24 ENCOUNTER — Telehealth: Payer: Self-pay | Admitting: Internal Medicine

## 2019-12-24 ENCOUNTER — Other Ambulatory Visit: Payer: Medicare Other | Admitting: *Deleted

## 2019-12-24 ENCOUNTER — Other Ambulatory Visit: Payer: Self-pay

## 2019-12-24 VITALS — BP 146/73 | HR 103 | Temp 98.1°F | Resp 20 | Ht 68.0 in

## 2019-12-24 DIAGNOSIS — Z89611 Acquired absence of right leg above knee: Secondary | ICD-10-CM | POA: Diagnosis not present

## 2019-12-24 DIAGNOSIS — I779 Disorder of arteries and arterioles, unspecified: Secondary | ICD-10-CM | POA: Diagnosis not present

## 2019-12-24 DIAGNOSIS — I739 Peripheral vascular disease, unspecified: Secondary | ICD-10-CM

## 2019-12-24 DIAGNOSIS — Z515 Encounter for palliative care: Secondary | ICD-10-CM

## 2019-12-24 MED ORDER — ALPRAZOLAM 0.5 MG PO TABS
0.5000 mg | ORAL_TABLET | Freq: Three times a day (TID) | ORAL | 2 refills | Status: DC
Start: 2019-12-24 — End: 2020-02-16

## 2019-12-24 MED ORDER — ALPRAZOLAM 0.5 MG PO TABS: 0.5000 mg | ORAL_TABLET | Freq: Three times a day (TID) | ORAL | 2 refills | Status: DC

## 2019-12-24 MED ORDER — ALPRAZOLAM 0.5 MG PO TABS: 0.5000 mg | ORAL_TABLET | Freq: Three times a day (TID) | ORAL | 0 refills | Status: DC

## 2019-12-24 NOTE — Progress Notes (Signed)
[name], [name] ([id number]) Visit Report for 12/23/2019 Debridement Details Patient Name: Date of Service: DO RN, [place] V ID L. 12/23/2019 2:30 PM Medical Record Number: [id number] Patient Account Number: [id number] Date of Birth/Sex: Treating RN: 19-Apr-1929 (86 y.o. [name], [name] Primary Care Provider: Myriam [name] Other Clinician: Referring Provider: Treating Provider/Extender: [name] in Treatment: 6 Debridement Performed for Assessment: Wound #15 Sacrum Performed By: Physician [name]., MD Debridement Type: Debridement Level of Consciousness (Pre-procedure): Awake and Alert Pre-procedure Verification/Time Out Yes - 15:30 Taken: Start Time: 15:30 Pain Control: Lidocaine 4% T opical Solution T Area Debrided (L x W): otal 0.6 (cm) x 0.6 (cm) = 0.36 (cm) Tissue and other material debrided: Viable, Non-Viable, Slough, Subcutaneous, Skin: Dermis , Slough Level: Skin/Subcutaneous Tissue Debridement Description: Excisional Instrument: Curette Bleeding: Minimum Hemostasis Achieved: Pressure End Time: 15:31 Procedural Pain: 0 Post Procedural Pain: 0 Response to Treatment: Procedure was tolerated well Level of Consciousness (Post- Awake and Alert procedure): Post Debridement Measurements of Total Wound Length: (cm) 0.6 Stage: Category/Stage III Width: (cm) 0.2 Depth: (cm) 0.3 Volume: (cm) 0.028 Character of Wound/Ulcer Post Debridement: Improved Post Procedure Diagnosis Same as Pre-procedure Electronic Signature(s) Signed: 12/23/2019 5:21:51 PM By: Travis Ham MD Signed: 12/24/2019 9:59:17 AM By: Travis Hammock RN Entered By: [name] on 12/23/2019 16:49:59 -------------------------------------------------------------------------------- HPI Details Patient Name: Date of Service: DO RN, Travis V ID L. 12/23/2019 2:30 PM Medical Record Number: [id number] Patient Account Number: [id number] Date of Birth/Sex: Treating  RN: 06/10/1929 (86 y.o. [name] Primary Care Provider: Myriam [name] Other Clinician: Referring Provider: Treating Provider/Extender: [name] in Treatment: 6 History of Present Illness HPI Description: 01/30/18 on evaluation today patient presents for initial inspection concerning the skin tear of the right forearm. Fortunately there does not appear to be any evidence of infection and this occurred approximately three days ago. He has been using Vaseline over the region. With that being said the patient does have a history of cataracts COPD, hypertension, peripheral arterial disease, and osteoarthritis. He tells me that he does have a little bit of pain at the site but nothing too significant at this time which is good news. Fortunately this overall appears to be doing fairly well which is good news. No fevers, chills, nausea, or vomiting noted at this time. The biggest issue at this point is that the wound is of quite significant size. READMISSION 04/21/2019 This is a 86 year old man with multiple medical problems who was seen once in the clinic here in January 2020 with a skin tear on his right forearm seen by [name]. This problem this time started sometime recently. He was noted by podiatry to have erosions and wounds of his right foot on toes 1-3. He also saw [name] of dermatology who gave him Silvadene cream for what I think was felt to be a stasis dermatitis of his bilateral lower legs. At some point he was referred to [name]. He was also noted to have previous noninvasive arterial studies that showed no flow to either one of his toes and noncompressible ABIs bilaterally. [name] did noninvasive arterial studies on him. These showed that he had greater than 75% stenosis in the proximal and mid common femoral artery. Monophasic flow in the posterior tibial and dorsalis pedis positions on the right foot. ABIs again were noncompressible. He was  felt to have total occlusion of the superficial femoral artery on the right. The overall  interpretation is that he had severe multilevel arterial occlusive disease. Absent femoral pulses and superficial femoral artery occlusions. In spite of this he was not felt to be candidate for arteriography given his bilateral femoral artery occlusions as well he would not be a candidate for endovascular approach secondary to his common femoral artery occlusions and proximal iliac disease. He was not felt to be a candidate for an open infra inguinal bypass. He therefore felt he would need to be monitored over time with the only option being a right below-knee amputation The patient complains currently of pain at night when he is up in bed. The pain in the right foot is better when he puts the leg down over the bed side. This is compatible with rest pain. He has very limited activity i.e. is even exhausted getting dressed in the morning because of cardiopulmonary issues therefore it is difficult to gauge claudication I believe he was referred here by Dr. Denna [name] with one of the major issues is whether he would be a candidate for hyperbaric oxygen. He is not a diabetic. Dr. Denna [name] did give him Silvadene cream for dry flaking skin in his bilateral lower extremities, however the patient is concerned about using this in the face of sulfa allergy. 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 This is on the medial aspect extended there is a second area on the plantar aspect. Most of what the patient talks about is claudication with minimal activity [however the patient is limited by his COPD],  or even at rest at night. He is not getting a lot of sleep. We use silver collagen on this wound 5/4; the patient has a wound on the tip of his right great toe also on the medial aspect. Setting of severe nonrevascularizable PAD [see description in HPI]. He does not describe any change in his pain. We have been using silver collagen 5/18; 2-week follow-up. The patient has wound on the tip of his right great toe as well as the medial aspect. He has developed new wounds x2 on the right lateral foot and the right lateral calcaneus. These look like ischemic areas. The patient talks about claudication at night. He seems to be more comfortable during the day. We have been using silver collagen to the wounds. I have previously debrided the eschar on the toes however this comes right back. 6/1; 2-week follow-up. This patient has ischemic wounds on his right foot in the setting of nonunreconstructable PAD [previously reviewed by Dr. Dixon]. He does not really describe severe pain but he does have episodic pain in the right great toe. He has an area on the tip of the right great toe as well as the medial aspect of the right great toe. A new area on the tip of the right fourth toe. He has the area on the right lateral foot fortunately the area on the right lateral heel appears to have epithelialized over. We are using silver collagen to all wounds 06/23/2019 upon evaluation today patient appears to be doing about the same in regard to his foot ulcers. Unfortunately he has 2 new skin tears on his left elbow and the right forearm that were new as of  today. He states that it was doing okay until they went to change the dressing and they put some collagen on it that got stuck and somewhat pulled back the skin unfortunately. Fortunately there is no signs of active infection at this time. No fevers, chills, nausea, vomiting, or diarrhea. 6/29; the patient was hospitalized from 6/18 through 6/23 going in with increasing  drainage from the wounds on his right foot. He underwent a angiogram on 06/30/2019 by Dr. Donzetta Matters which revealed multilevel arterial disease including occlusion of the right common femoral artery. He reconstitutes the profunda femoris and the below-knee popliteal artery and appears to have posterior tibial peroneal runoff to the ankle although there is heavy calcification of the popliteal below the knee. He was offered consideration of a right above-knee amputation, palliative and/or hospice care and consideration of a very high risk attempt at bypass. He did receive IV antibiotics. He comes in the clinic today again with the 2 areas that are necrotic on the left great toe and a small punched out area on the right lateral foot. A lot of the erythema on top of the right foot but no warmth. He has a lot of swelling here. The patient is literally tortured over this decision that he needs to make. I went over this with him in exceptional detail. I do not believe he is a candidate for any form of open bypass surgery. I think his vascular doctors would agree with that. He still has pain at night when he puts his leg up on the bed that is relieved by putting his leg dependent. This is not different from what I remember. He is not septic there is no additional skin breakdown that I can see 7/8 the patient continues to have severe pain in the right foot at night making it difficult for him to rest. There is increasing erythema in the right foot but the foot is cold. I do not think this is an infection I think this is likely to be tissue necrosis from ischemia. I thought he would have made an up decision today about going forward with hospice he has not. He did not go to see [name] on 7/6. Again he wants to talk to me at length about amputations. I do not think he will do well after an above-knee amputation on the right. Furthermore I do not think he is a candidate for bypass surgery on the right leg 7/20;  patient still having a lot of pain in the right foot. He saw Dr. Donzetta Matters on 7/16. He is not felt to be able to undergo a complex revascularization and I certainly agree with that. He was offered a right below-knee amputation but he has not made that decision. I have repetitively suggested hospice care if he will not undergo an amputation he is not decided he wants to go that route either. He arrives in the clinic today accompanied by his son who is a Stage manager and his wife. I think the patient has tissue breakdown in the dorsal foot secondary to ischemia although his son brought up that some of the erythema could be a contact dermatitis as they were apparently layering a large area of pure call over the wound area. I was not aware of this not supposed to be doing it in this fashion. In any case the erythema look better than last week and I am quite convinced this is not cellulitis READMISSION 11/11/2019 [name] is now an 86 year old man who we  had in this clinic from [place] through July/21 largely as a result of ischemic wounds in his right foot. He was followed by vein and vascular [name]. His anatomy was not felt to be amenable to a revascularization. The patient had increasing ischemic pain but was very resistant to the notion of an amputation. Eventually he did go through with this on August 22 he will underwent an AKA by [name]. He went to rehabilitation. There he developed a small stage III lower sacral pressure ulcer and an area on his left lateral heel which is also probably a pressure ulcer. They have been using silver collagen on both wound areas. The patient states he is not in any unrelieved pain. He is in a wheelchair at home with a Roho cushion. He is able to do his own transfers. I think he is probably putting far too much pressure on these areas Past medical history is reviewed. He has a history of CHF, severe PAD, hypertension, coronary artery disease status post CABG, COPD  Gold stage IV on O2, hypothyroidism and a recent problem with urinary retention he now has a Foley catheter. He has had problems with left leg swelling and adjustment of his diuretics he is followed by Dr. Daneen Schick. The patient has severe PAD. On 06/30/2019 he had an angiogram on the remaining left side he had heavily diseased femoral artery. His superficial femoral artery was flush occluded with runoff in the profunda. He was felt his SFA was likely occluded and reconstitutes the popliteal artery via runoff via the peroneal and posterior tibial artery. As noted he is not in a lot of pain. His last noninvasive study was on 6/20. On the left side this was noncompressible ABI but a TBI of only 0.20 with dampened monophasic waveforms 11/25/2019. Patient has 2 wound areas 1 on the left lateral heel and a ulcer on his coccyx. These are probably all pressure related although he has significant PAD complicating any attempt to heal a wound on the left heel. We have been using silver collagen on the wound and the coccyx silver alginate on the heel. 11/30; miraculously the left lateral heel wound has healed. The area on his coccyx is not much better. We have been using Prisma He apparently went back to see [name]. His wife paraphrasing says that he had terrible blood flow in the left lower extremity but he is not a candidate for revascularization. Fortunately had enough blood flow to heal the wound I will try to look at Dr. Mee Hives note by the time these next year 12/14; the area on his left lateral heel remains closed The area on his coccyx is about the same in terms of surface area and depth minimal debris however but it is still adherent. We have been using Iodoflex. They stop me before I left the room to show me his right AKA stump. He tells me he is having a lot of pain in this area that is episodic it almost looks like there is bruising in this area but the bruising looks old I asked him about trauma  but they were not aware of any. They see vascular tomorrow Electronic Signature(s) Signed: 12/23/2019 5:21:51 PM By: Travis Ham MD Entered By: [name] on 12/23/2019 16:51:35 -------------------------------------------------------------------------------- Physical Exam Details Patient Name: Date of Service: DO RN, Travis V ID L. 12/23/2019 2:30 PM Medical Record Number: [id number] Patient Account Number: [id number] Date of Birth/Sex: Treating RN: 1929/08/03 (86 y.o. [name] Primary Care  Provider: Myriam [name] Other Clinician: Referring Provider: Treating Provider/Extender: [name] in Treatment: 6 Constitutional Patient is hypertensive.. Pulse regular and within target range for patient.Marland Kitchen Respirations regular, non-labored and within target range.. Temperature is normal and within the target range for the patient.Marland Kitchen Appears in no distress. Notes Wound exam The left lateral heel remains closed this in itself is a pleasant surprise. The area over the coccyx has a better looking surface but still mostly covered in a very adherent necrotic debris which I removed with a #3 curette this bleeds freely and cleans up nicely. I also looked at his right AKA stump at the request of the patient's wife. He has bruising in this area that looks about 73 days old. I am not exactly sure how he would have done this. Perhaps incidental trauma. I also note that he does not have a palpable pulse in the left femoral area. I wonder if this accounts for some of his pain Electronic Signature(s) Signed: 12/23/2019 5:21:51 PM By: Travis Ham MD Entered By: [name] on 12/23/2019 16:52:56 -------------------------------------------------------------------------------- Physician Orders Details Patient Name: Date of Service: DO RN, Travis V ID L. 12/23/2019 2:30 PM Medical Record Number: [id number] Patient Account Number: [id number] Date of Birth/Sex: Treating  RN: Jan 12, 1929 (86 y.o. [name] Primary Care Provider: Myriam [name] Other Clinician: Referring Provider: Treating Provider/Extender: [name] in Treatment: 6 Verbal / Phone Orders: No Diagnosis Coding Follow-up Appointments Return Appointment in 2 weeks. Bathing/ Shower/ Hygiene May shower and wash wound with soap and water. - with dressing changes only. Home Health No change in wound care orders this week; continue Home Health for wound care. May utilize formulary equivalent dressing for wound treatment orders unless otherwise specified. Wound Treatment Wound #15 - Sacrum Cleanser: Wound Cleanser [facility]) Every Other Day/30 Days Discharge Instructions: Cleanse the wound with wound cleanser prior to applying a clean dressing using gauze sponges, not tissue or cotton balls. Peri-Wound Care: Skin Prep [facility]) Every Other Day/30 Days Discharge Instructions: Use skin prep as directed Prim Dressing: IODOFLEX 0.9% Cadexomer Iodine Pad 4x6 cm [facility]) Every Other Day/30 Days ary Discharge Instructions: Apply to wound bed as instructed Secondary Dressing: ComfortFoam Border, 3x3 in (silicone border) [facility]) Every Other Day/30 Days Discharge Instructions: Apply over primary dressing as directed. Electronic Signature(s) Signed: 12/23/2019 5:21:51 PM By: Travis Ham MD Signed: 12/24/2019 9:59:17 AM By: Travis Hammock RN Entered By: [name] on 12/23/2019 15:35:13 -------------------------------------------------------------------------------- Problem List Details Patient Name: Date of Service: DO RN, Travis V ID L. 12/23/2019 2:30 PM Medical Record Number: [id number] Patient Account Number: [id number] Date of Birth/Sex: Treating RN: 12-Aug-1929 (86 y.o. [name], [name] Primary Care Provider: Myriam [name] Other Clinician: Referring Provider: Treating Provider/Extender: [name] in  Treatment: 6 Active Problems ICD-10 Encounter Code Description Active Date MDM Diagnosis L89.153 Pressure ulcer of sacral region, stage 3 11/11/2019 No Yes Z89.611 Acquired absence of right leg above knee 11/11/2019 No Yes Inactive Problems ICD-10 Code Description Active Date Inactive Date L89.623 Pressure ulcer of left heel, stage 3 11/11/2019 11/11/2019 Resolved Problems Electronic Signature(s) Signed: 12/23/2019 5:21:51 PM By: Travis Ham MD Entered By: [name] on 12/23/2019 16:49:37 -------------------------------------------------------------------------------- Progress Note Details Patient Name: Date of Service: DO RN, Travis V ID L. 12/23/2019 2:30 PM Medical Record Number: [id number] Patient Account Number: [id number] Date of Birth/Sex: Treating RN: [birth year] (86 y.o. [name] Primary Care Provider: Harrington Challenger,  Jossie Ng Other Clinician: Referring Provider: Treating Provider/Extender: [name] in Treatment: 6 Subjective History of Present Illness (HPI) 01/30/18 on evaluation today patient presents for initial inspection concerning the skin tear of the right forearm. Fortunately there does not appear to be any evidence of infection and this occurred approximately three days ago. He has been using Vaseline over the region. With that being said the patient does have a history of cataracts COPD, hypertension, peripheral arterial disease, and osteoarthritis. He tells me that he does have a little bit of pain at the site but nothing too significant at this time which is good news. Fortunately this overall appears to be doing fairly well which is good news. No fevers, chills, nausea, or vomiting noted at this time. The biggest issue at this point is that the wound is of quite significant size. READMISSION 04/21/2019 This is a 86 year old man with multiple medical problems who was seen once in the clinic here in January 2020 with a skin tear on his  right forearm seen by [name]. This problem this time started sometime recently. He was noted by podiatry to have erosions and wounds of his right foot on toes 1-3. He also saw [name] of dermatology who gave him Silvadene cream for what I think was felt to be a stasis dermatitis of his bilateral lower legs. At some point he was referred to [name]. He was also noted to have previous noninvasive arterial studies that showed no flow to either one of his toes and noncompressible ABIs bilaterally. [name] did noninvasive arterial studies on him. These showed that he had greater than 75% stenosis in the proximal and mid common femoral artery. Monophasic flow in the posterior tibial and dorsalis pedis positions on the right foot. ABIs again were noncompressible. He was felt to have total occlusion of the superficial femoral artery on the right. The overall interpretation is that he had severe multilevel arterial occlusive disease. Absent femoral pulses and superficial femoral artery occlusions. In spite of this he was not felt to be candidate for arteriography given his bilateral femoral artery occlusions as well he would not be a candidate for endovascular approach secondary to his common femoral artery occlusions and proximal iliac disease. He was not felt to be a candidate for an open infra inguinal bypass. He therefore felt he would need to be monitored over time with the only option being a right below-knee amputation The patient complains currently of pain at night when he is up in bed. The pain in the right foot is better when he puts the leg down over the bed side. This is compatible with rest pain. He has very limited activity i.e. is even exhausted getting dressed in the morning because of cardiopulmonary issues therefore it is difficult to gauge claudication I believe he was referred here by Dr. Denna [name] with one of the major issues is whether he would be a candidate for hyperbaric  oxygen. He is not a diabetic. Dr. Denna [name] did give him Silvadene cream for dry flaking skin in his bilateral lower extremities, however the patient is concerned about using this in the face of sulfa allergy. Podiatry had previously given him in a steroid cream I do not believe these use this either Past medical history is extensive including basal cell and squamous cell skin cancers, combined congestive heart failure, O2 dependent COPD, cor pulmonale, venous stasis dermatitis, peripheral arterial disease, hearing loss, 4/20; the patient still has a  wound on the right first toe. This is on the medial aspect extended there is a second area on the plantar aspect. Most of what the patient talks about is claudication with minimal activity [however the patient is limited by his COPD], or even at rest at night. He is not getting a lot of sleep. We use silver collagen on this wound 5/4; the patient has a wound on the tip of his right great toe also on the medial aspect. Setting of severe nonrevascularizable PAD [see description in HPI]. He does not describe any change in his pain. We have been using silver collagen 5/18; 2-week follow-up. The patient has wound on the tip of his right great toe as well as the medial aspect. He has developed new wounds x2 on the right lateral foot and the right lateral calcaneus. These look like ischemic areas. The patient talks about claudication at night. He seems to be more comfortable during the day. We have been using silver collagen to the wounds. I have previously debrided the eschar on the toes however this comes right back. 6/1; 2-week follow-up. This patient has ischemic wounds on his right foot in the setting of nonunreconstructable PAD [previously reviewed by Dr. Dixon]. He does not really describe severe pain but he does have episodic pain in the right great toe. He has an area on the tip of the right great toe as well as the medial aspect of the right great toe. A  new area on the tip of the right fourth toe. He has the area on the right lateral foot fortunately the area on the right lateral heel appears to have epithelialized over. We are using silver collagen to all wounds 06/23/2019 upon evaluation today patient appears to be doing about the same in regard to his foot ulcers. Unfortunately he has 2 new skin tears on his left elbow and the right forearm that were new as of today. He states that it was doing okay until they went to change the dressing and they put some collagen on it that got stuck and somewhat pulled back the skin unfortunately. Fortunately there is no signs of active infection at this time. No fevers, chills, nausea, vomiting, or diarrhea. 6/29; the patient was hospitalized from 6/18 through 6/23 going in with increasing drainage from the wounds on his right foot. He underwent a angiogram on 06/30/2019 by Dr. Donzetta Matters which revealed multilevel arterial disease including occlusion of the right common femoral artery. He reconstitutes the profunda femoris and the below-knee popliteal artery and appears to have posterior tibial peroneal runoff to the ankle although there is heavy calcification of the popliteal below the knee. He was offered consideration of a right above-knee amputation, palliative and/or hospice care and consideration of a very high risk attempt at bypass. He did receive IV antibiotics. He comes in the clinic today again with the 2 areas that are necrotic on the left great toe and a small punched out area on the right lateral foot. A lot of the erythema on top of the right foot but no warmth. He has a lot of swelling here. The patient is literally tortured over this decision that he needs to make. I went over this with him in exceptional detail. I do not believe he is a candidate for any form of open bypass surgery. I think his vascular doctors would agree with that. He still has pain at night when he puts his leg up on the bed that  is relieved  by putting his leg dependent. This is not different from what I remember. He is not septic there is no additional skin breakdown that I can see 7/8 the patient continues to have severe pain in the right foot at night making it difficult for him to rest. There is increasing erythema in the right foot but the foot is cold. I do not think this is an infection I think this is likely to be tissue necrosis from ischemia. I thought he would have made an up decision today about going forward with hospice he has not. He did not go to see [name] on 7/6. Again he wants to talk to me at length about amputations. I do not think he will do well after an above-knee amputation on the right. Furthermore I do not think he is a candidate for bypass surgery on the right leg 7/20; patient still having a lot of pain in the right foot. He saw Dr. Donzetta Matters on 7/16. He is not felt to be able to undergo a complex revascularization and I certainly agree with that. He was offered a right below-knee amputation but he has not made that decision. I have repetitively suggested hospice care if he will not undergo an amputation he is not decided he wants to go that route either. He arrives in the clinic today accompanied by his son who is a Stage manager and his wife. I think the patient has tissue breakdown in the dorsal foot secondary to ischemia although his son brought up that some of the erythema could be a contact dermatitis as they were apparently layering a large area of pure call over the wound area. I was not aware of this not supposed to be doing it in this fashion. In any case the erythema look better than last week and I am quite convinced this is not cellulitis READMISSION 11/11/2019 [name] is now an 86 year old man who we had in this clinic from [place] through July/21 largely as a result of ischemic wounds in his right foot. He was followed by vein and vascular [name]. His anatomy was not felt to be  amenable to a revascularization. The patient had increasing ischemic pain but was very resistant to the notion of an amputation. Eventually he did go through with this on August 22 he will underwent an AKA by [name]. He went to rehabilitation. There he developed a small stage III lower sacral pressure ulcer and an area on his left lateral heel which is also probably a pressure ulcer. They have been using silver collagen on both wound areas. The patient states he is not in any unrelieved pain. He is in a wheelchair at home with a Roho cushion. He is able to do his own transfers. I think he is probably putting far too much pressure on these areas Past medical history is reviewed. He has a history of CHF, severe PAD, hypertension, coronary artery disease status post CABG, COPD Gold stage IV on O2, hypothyroidism and a recent problem with urinary retention he now has a Foley catheter. He has had problems with left leg swelling and adjustment of his diuretics he is followed by Dr. Daneen Schick. The patient has severe PAD. On 06/30/2019 he had an angiogram on the remaining left side he had heavily diseased femoral artery. His superficial femoral artery was flush occluded with runoff in the profunda. He was felt his SFA was likely occluded and reconstitutes the popliteal artery via runoff via the peroneal and posterior  tibial artery. As noted he is not in a lot of pain. His last noninvasive study was on 6/20. On the left side this was noncompressible ABI but a TBI of only 0.20 with dampened monophasic waveforms 11/25/2019. Patient has 2 wound areas 1 on the left lateral heel and a ulcer on his coccyx. These are probably all pressure related although he has significant PAD complicating any attempt to heal a wound on the left heel. We have been using silver collagen on the wound and the coccyx silver alginate on the heel. 11/30; miraculously the left lateral heel wound has healed. The area on his coccyx is not  much better. We have been using Prisma He apparently went back to see [name]. His wife paraphrasing says that he had terrible blood flow in the left lower extremity but he is not a candidate for revascularization. Fortunately had enough blood flow to heal the wound I will try to look at Dr. Mee Hives note by the time these next year 12/14; the area on his left lateral heel remains closed ooThe area on his coccyx is about the same in terms of surface area and depth minimal debris however but it is still adherent. We have been using Iodoflex. ooThey stop me before I left the room to show me his right AKA stump. He tells me he is having a lot of pain in this area that is episodic it almost looks like there is bruising in this area but the bruising looks old I asked him about trauma but they were not aware of any. They see vascular tomorrow Objective Constitutional Patient is hypertensive.. Pulse regular and within target range for patient.Marland Kitchen Respirations regular, non-labored and within target range.. Temperature is normal and within the target range for the patient.Marland Kitchen Appears in no distress. Vitals Time Taken: 3:09 PM, Height: 69 in, Temperature: 98.7 F, Pulse: 91 bpm, Respiratory Rate: 18 breaths/min, Blood Pressure: 150/73 mmHg. General Notes: Wound exam ooThe left lateral heel remains closed this in itself is a pleasant surprise. ooThe area over the coccyx has a better looking surface but still mostly covered in a very adherent necrotic debris which I removed with a #3 curette this bleeds freely and cleans up nicely. ooI also looked at his right AKA stump at the request of the patient's wife. He has bruising in this area that looks about 72 days old. I am not exactly sure how he would have done this. Perhaps incidental trauma. I also note that he does not have a palpable pulse in the left femoral area. I wonder if this accounts for some of his pain Integumentary (Hair, Skin) Wound #15 status  is Open. Original cause of wound was Pressure Injury. The wound is located on the Sacrum. The wound measures 0.6cm length x 0.2cm width x 0.3cm depth; 0.094cm^2 area and 0.028cm^3 volume. There is Fat Layer (Subcutaneous Tissue) exposed. There is no tunneling noted, however, there is undermining starting at 12:00 and ending at 12:00 with a maximum distance of 0.3cm. There is a small amount of serous drainage noted. The wound margin is epibole. There is small (1-33%) pink, pale granulation within the wound bed. There is a large (67-100%) amount of necrotic tissue within the wound bed including Adherent Slough. Assessment Active Problems ICD-10 Pressure ulcer of sacral region, stage 3 Acquired absence of right leg above knee Procedures Wound #15 Pre-procedure diagnosis of Wound #15 is a Pressure Ulcer located on the Sacrum . There was a Excisional Skin/Subcutaneous Tissue Debridement  with a total area of 0.36 sq cm performed by [name]., MD. With the following instrument(s): Curette to remove Viable and Non-Viable tissue/material. Material removed includes Subcutaneous Tissue, Slough, and Skin: Dermis after achieving pain control using Lidocaine 4% T opical Solution. No specimens were taken. A time out was conducted at 15:30, prior to the start of the procedure. A Minimum amount of bleeding was controlled with Pressure. The procedure was tolerated well with a pain level of 0 throughout and a pain level of 0 following the procedure. Post Debridement Measurements: 0.6cm length x 0.2cm width x 0.3cm depth; 0.028cm^3 volume. Post debridement Stage noted as Category/Stage III. Character of Wound/Ulcer Post Debridement is improved. Post procedure Diagnosis Wound #15: Same as Pre-Procedure Plan Follow-up Appointments: Return Appointment in 2 weeks. Bathing/ Shower/ Hygiene: May shower and wash wound with soap and water. - with dressing changes only. Home Health: No change in wound care  orders this week; continue Home Health for wound care. May utilize formulary equivalent dressing for wound treatment orders unless otherwise specified. WOUND #15: - Sacrum Wound Laterality: Cleanser: Wound Cleanser (Home Health) Every Other Day/30 Days Discharge Instructions: Cleanse the wound with wound cleanser prior to applying a clean dressing using gauze sponges, not tissue or cotton balls. Peri-Wound Care: Skin Prep [facility]) Every Other Day/30 Days Discharge Instructions: Use skin prep as directed Prim Dressing: IODOFLEX 0.9% Cadexomer Iodine Pad 4x6 cm [facility]) Every Other Day/30 Days ary Discharge Instructions: Apply to wound bed as instructed Secondary Dressing: ComfortFoam Border, 3x3 in (silicone border) [facility]) Every Other Day/30 Days Discharge Instructions: Apply over primary dressing as directed. #1 I am continuing with the Iodoflex for at least another week. Ultimately probably endoform if I can get the surface of this to look completely viable. There is no evidence of infection in the area. They are attempting to meticulously offload this area Electronic Signature(s) Signed: 12/23/2019 5:21:51 PM By: Travis Ham MD Entered By: [name] on 12/23/2019 16:54:13 -------------------------------------------------------------------------------- SuperBill Details Patient Name: Date of Service: DO RN, Travis V ID L. 12/23/2019 Medical Record Number: [id number] Patient Account Number: [id number] Date of Birth/Sex: Treating RN: 1929-03-10 (86 y.o. [name], [name] Primary Care Provider: Myriam [name] Other Clinician: Referring Provider: Treating Provider/Extender: [name] in Treatment: 6 Diagnosis Coding ICD-10 Codes Code Description [phone] Pressure ulcer of sacral region, stage 3 Z89.611 Acquired absence of right leg above knee Facility Procedures CPT4 Code: [id number] Description: 84166 - DEB SUBQ TISSUE 20 SQ CM/< ICD-10  Diagnosis Description L89.153 Pressure ulcer of sacral region, stage 3 Modifier: Quantity: 1 Physician Procedures Electronic Signature(s) Signed: 12/23/2019 5:21:51 PM By: Travis Ham MD Entered By: [name] on 12/23/2019 16:55:03

## 2019-12-24 NOTE — Progress Notes (Signed)
POST OPERATIVE OFFICE NOTE    CC:  F/u for surgery  HPI:  This is a 86 y.o. male who is s/p right AKA 08/11/19 by Dr. Scot Dock.  He was last seen on 11/27/19 were few small superficial wounds were noted on the lateral left foot.  The recent angiogram reveled sever calcification in the vessels and he does not have revascularization options.  He was instructed to wear compression for edema on the left and elevation.  He has CHF and is on Lasix for fluid retention.    Pt returns today for follow up.  He has been followed by wound care and states his wounds are fully healed.  He continues to have left LE edema into the foot with periods of weeping.   His right AKA stump has sever pains on/off.  The Neurotin and Lyrica don't seem to be helping.      Allergies  Allergen Reactions  . Cefdinir Diarrhea and Other (See Comments)    Severe Diarrhea  . Nitrofurantoin Swelling and Other (See Comments)    Hand Swelling  . Sulfa Antibiotics Anaphylaxis and Swelling  . Sulfonamide Derivatives Swelling and Other (See Comments)    Facial/tongue swelling  . Tape Other (See Comments)    SKIN IS VERY THIN AND TEARS EASILY!!!!! Please do NOT use "plastic" tape- USE PAPER!!  . Ciprofloxacin Itching, Rash and Other (See Comments)    Red itchy hands  . Nitrofurantoin Swelling and Other (See Comments)    Swollen hands  . Amoxicillin Er Other (See Comments)    Frequest urination  . Doxycycline Other (See Comments)    "Felt terrible"  . Levaquin [Levofloxacin] Itching and Rash  . Sertraline Anxiety and Other (See Comments)    Makes the patient jittery    Current Outpatient Medications  Medication Sig Dispense Refill  . ALPRAZolam (XANAX) 0.5 MG tablet Take 1 tablet (0.5 mg total) by mouth 3 (three) times daily. 90 tablet 2  . aspirin 81 MG tablet Take 81 mg by mouth daily.    Marland Kitchen atorvastatin (LIPITOR) 20 MG tablet TAKE 1 TABLET BY MOUTH EVERY DAY (Patient taking differently: Take 20 mg by mouth at bedtime.  ) 90 tablet 2  . calcium carbonate (TUMS - DOSED IN MG ELEMENTAL CALCIUM) 500 MG chewable tablet Chew 1 tablet (200 mg of elemental calcium total) by mouth 2 (two) times daily as needed for indigestion or heartburn.    . Cholecalciferol (VITAMIN D3) 2000 units TABS Take 2,000 Units by mouth daily with lunch.     . famotidine (PEPCID) 20 MG tablet Take 20 mg by mouth at bedtime.     . feeding supplement, ENSURE ENLIVE, (ENSURE ENLIVE) LIQD Take 237 mLs by mouth 3 (three) times daily between meals. 237 mL 12  . finasteride (PROSCAR) 5 MG tablet Take 5 mg by mouth daily with lunch.     . fluticasone (FLONASE) 50 MCG/ACT nasal spray Place 2 sprays into both nostrils daily.    . furosemide (LASIX) 20 MG tablet Take two tablets by mouth every morning.  Take one tablet by mouth every evening. 270 tablet 3  . gabapentin (NEURONTIN) 100 MG capsule Take by mouth.    . hydrocerin (EUCERIN) CREA Apply 1 application topically 2 (two) times daily. To foot  0  . levalbuterol (XOPENEX HFA) 45 MCG/ACT inhaler Inhale 2 puffs into the lungs every 4 (four) hours as needed for wheezing or shortness of breath. 15 g 5  . levalbuterol (XOPENEX) 0.63 MG/3ML  nebulizer solution Take 3 mLs (0.63 mg total) by nebulization every 4 (four) hours as needed for wheezing or shortness of breath. 120 mL 3  . levothyroxine (SYNTHROID) 50 MCG tablet Take 50 mcg by mouth daily.    . metoprolol succinate (TOPROL-XL) 25 MG 24 hr tablet Take 0.5 tablets (12.5 mg total) by mouth daily. 45 tablet 3  . mirtazapine (REMERON) 15 MG tablet Take 15 mg by mouth at bedtime.    . mometasone (ELOCON) 0.1 % cream Apply topically daily as needed.    . Multiple Vitamins-Minerals (PRESERVISION AREDS 2) CAPS Take 1 capsule by mouth daily.    Marland Kitchen MYRBETRIQ 25 MG TB24 tablet Take 25 mg by mouth daily.    Marland Kitchen NITROSTAT 0.4 MG SL tablet Place 0.4 mg under the tongue every 5 (five) minutes as needed for chest pain.    . OXYGEN Inhale 2 L/min into the lungs  continuous.     . pantoprazole (PROTONIX) 40 MG tablet TAKE 1 TABLET(40 MG) BY MOUTH DAILY 30 TO 60 MINUTES BEFORE FIRST MEAL OF THE DAY 30 tablet 3  . Polyethyl Glycol-Propyl Glycol (SYSTANE) 0.4-0.3 % SOLN Apply 1 drop to eye 2 (two) times daily as needed (eye irritation).  0  . polyethylene glycol (MIRALAX / GLYCOLAX) 17 g packet Take 17 g by mouth daily. 14 each 0  . potassium chloride 20 MEQ/15ML (10%) SOLN Take 20 mEq by mouth daily.    . predniSONE (DELTASONE) 10 MG tablet FOR FLARE OF COUGH/WHEEZING MAY INCREASE TO 2 DAILY BETTER, THAN 1 TABLET BY MOUTH EVERY DAY 100 tablet 2  . pregabalin (LYRICA) 50 MG capsule Take 1 capsule (50 mg total) by mouth 2 (two) times daily. 60 capsule 1  . silodosin (RAPAFLO) 8 MG CAPS capsule Take 8 mg by mouth daily.     . sodium chloride (MURO 128) 5 % ophthalmic solution Place 1 drop into both eyes 4 (four) times daily. 15 mL   . spironolactone (ALDACTONE) 25 MG tablet Take 25 mg by mouth daily.    . SYMBICORT 160-4.5 MCG/ACT inhaler Inhale 2 puffs into the lungs 2 (two) times daily. 1 Inhaler 5  . Tiotropium Bromide Monohydrate (SPIRIVA RESPIMAT) 2.5 MCG/ACT AERS INHALE 2 PUFFS BY MOUTH EVERY DAY (Patient taking differently: Inhale 2 puffs into the lungs daily. ) 4 g 11  . vitamin B-12 (CYANOCOBALAMIN) 1000 MCG tablet Take 1,000 mcg by mouth daily.     . Zinc Oxide (TRIPLE PASTE) 12.8 % ointment Apply topically as needed for irritation. To gluteal cleft/buttocks 56.7 g 0   No current facility-administered medications for this visit.     ROS:  See HPI  Physical Exam:     Extremities:  Left foot with rubor lymph edema.  No skin lesions noted today.  Motor of ankle and toes is intact.  The right AKA stump has an anterior bruised area without skin compromise.  Tender to touch.  Motor intact. Lungs: Constant O2 dependent   Assessment/Plan:  This is a 86 y.o. male who is s/p:Right aka with on/off sever shooting pains, anterior bruised area without  skin compromise.  Left LE with sever tibial disease and lymph edema.  No open wounds.    There are no new treatment options for his LE edema/lymphedema and I suggested ice verses heat protectively for his stump pain.  Keep a close check on his skin.  If he gets a non healing wound on the extremities that becomes infected his only option would be  further amputation.   He does not have any revascularization options.  Roxy Horseman PA-C [facility] [phone]  Clinic MD:  Oneida Alar

## 2019-12-24 NOTE — Telephone Encounter (Signed)
Called and spoke with patient, advised on Dr. Gustavus Bryant recommendations regarding Xanax.  He verbalized understanding.  Called [facility] and spoke with to verify that the script for Xanax went through.  I spoke with Roselyn Reef in the pharmacy, she advised that the electronic order did not go through.  I was able to give a verbal order.  Nothing further needed.

## 2019-12-24 NOTE — Progress Notes (Signed)
[name], [name] ([id number]) Visit Report for 12/23/2019 Arrival Information Details Patient Name: Date of Service: DO RN, [place] V ID L. 12/23/2019 2:30 PM Medical Record Number: [id number] Patient Account Number: [id number] Date of Birth/Sex: Treating RN: April 19, 1929 (86 y.o. [name]) [name] Primary Care Dannell Raczkowski: Myriam Jacobson Other Clinician: Referring Mantaj Chamberlin: Treating Shayna Eblen/Extender: Katina Degree in Treatment: 6 Visit Information History Since Last Visit All ordered tests and consults were completed: No Patient Arrived: Wheel Chair Added or deleted any medications: No Arrival Time: 15:08 Any new allergies or adverse reactions: No Accompanied By: wife Had a fall or experienced change in No Transfer Assistance: None activities of daily living that may affect Patient Identification Verified: Yes risk of falls: Secondary Verification Process Completed: Yes Signs or symptoms of abuse/neglect since last visito No Patient Requires Transmission-Based Precautions: No Hospitalized since last visit: No Patient Has Alerts: Yes Implantable device outside of the clinic excluding No Patient Alerts: L ABI N/C, TBI = .20 cellular tissue based products placed in the center since last visit: Has Dressing in Place as Prescribed: Yes Pain Present Now: No Electronic Signature(s) Signed: 12/23/2019 4:37:04 PM By: [name] RN Entered By: [name] on 12/23/2019 15:08:59 -------------------------------------------------------------------------------- Encounter Discharge Information Details Patient Name: Date of Service: DO RN, [name] 12/23/2019 2:30 PM Medical Record Number: [id number] Patient Account Number: [id number] Date of Birth/Sex: Treating RN: [birth year] (86 y.o. [name] Primary Care Aubre Quincy: Myriam Jacobson Other Clinician: Referring Jeriann Sayres: Treating Karinna Beadles/Extender: Katina Degree in Treatment: 6 Encounter Discharge  Information Items Post Procedure Vitals Discharge Condition: Stable Temperature (F): 98.7 Ambulatory Status: Wheelchair Pulse (bpm): 91 Discharge Destination: Home Respiratory Rate (breaths/min): 18 Transportation: Private Auto Blood Pressure (mmHg): 150/73 Accompanied By: wife Schedule Follow-up Appointment: Yes Clinical Summary of Care: Patient Declined Electronic Signature(s) Signed: 12/23/2019 4:37:04 PM By: [name] RN Entered By: [name] on 12/23/2019 15:41:23 -------------------------------------------------------------------------------- Multi Wound Chart Details Patient Name: Date of Service: DO RN, [name] 12/23/2019 2:30 PM Medical Record Number: [id number] Patient Account Number: [id number] Date of Birth/Sex: Treating RN: 1929/06/30 (86 y.o. [name], [name] Primary Care Beckham Buxbaum: Myriam Jacobson Other Clinician: Referring Lucendia Leard: Treating Marcelle Bebout/Extender: Katina Degree in Treatment: 6 Vital Signs Height(in): 84 Pulse(bpm): 40 Weight(lbs): Blood Pressure(mmHg): 150/73 Body Mass Index(BMI): Temperature(F): 98.7 Respiratory Rate(breaths/min): 18 Photos: [15:No Photos Sacrum] [N/A:N/A N/A] Wound Location: [15:Pressure Injury] [N/A:N/A] Wounding Event: [15:Pressure Ulcer] [N/A:N/A] Primary Etiology: [15:Cataracts, Asthma, Chronic] [N/A:N/A] Comorbid History: [15:Obstructive Pulmonary Disease (COPD), Congestive Heart Failure, Coronary Artery Disease, Hypertension, Peripheral Arterial Disease, Osteoarthritis, Confinement Anxiety 08/01/2019] [N/A:N/A] Date Acquired: [15:6] [N/A:N/A] Weeks of Treatment: [15:Open] [N/A:N/A] Wound Status: [15:0.6x0.2x0.3] [N/A:N/A] Measurements L x W x D (cm) [15:0.094] [N/A:N/A] A (cm) : rea [15:0.028] [N/A:N/A] Volume (cm) : [15:57.30%] [N/A:N/A] % Reduction in A [15:rea: -27.30%] [N/A:N/A] % Reduction in Volume: [15:12] Starting Position 1 (o'clock): [15:12] Ending Position 1 (o'clock):  [15:0.3] Maximum Distance 1 (cm): [15:Yes] [N/A:N/A] Undermining: [15:Category/Stage III] [N/A:N/A] Classification: [15:Small] [N/A:N/A] Exudate A mount: [15:Serous] [N/A:N/A] Exudate Type: [15:amber] [N/A:N/A] Exudate Color: [15:Epibole] [N/A:N/A] Wound Margin: [15:Small (1-33%)] [N/A:N/A] Granulation A mount: [15:Pink, Pale] [N/A:N/A] Granulation Quality: [15:Large (67-100%)] [N/A:N/A] Necrotic A mount: [15:Fat Layer (Subcutaneous Tissue): Yes N/A] Exposed Structures: [15:Fascia: No Tendon: No Muscle: No Joint: No Bone: No None] [N/A:N/A] Epithelialization: [15:Debridement - Excisional] [N/A:N/A] Debridement: Pre-procedure Verification/Time Out 15:30 [N/A:N/A] Taken: [15:Lidocaine 4% Topical Solution] [N/A:N/A] Pain Control: [15:Subcutaneous, Slough] [N/A:N/A] Tissue Debrided: [15:Skin/Subcutaneous Tissue] [  N/A:N/A] Level: [15:0.36] [N/A:N/A] Debridement A (sq cm): [15:rea Curette] [N/A:N/A] Instrument: [15:Minimum] [N/A:N/A] Bleeding: [15:Pressure] [N/A:N/A] Hemostasis A chieved: [15:0] [N/A:N/A] Procedural Pain: [15:0] [N/A:N/A] Post Procedural Pain: [15:Procedure was tolerated well] [N/A:N/A] Debridement Treatment Response: [15:0.6x0.2x0.3] [N/A:N/A] Post Debridement Measurements L x W x D (cm) [15:0.028] [N/A:N/A] Post Debridement Volume: (cm) [15:Category/Stage III] [N/A:N/A] Post Debridement Stage: [15:Debridement] [N/A:N/A] Treatment Notes Wound #15 (Sacrum) Cleanser Wound Cleanser Discharge Instruction: Cleanse the wound with wound cleanser prior to applying a clean dressing using gauze sponges, not tissue or cotton balls. Peri-Wound Care Skin Prep Discharge Instruction: Use skin prep as directed Topical Primary Dressing IODOFLEX 0.9% Cadexomer Iodine Pad 4x6 cm Discharge Instruction: Apply to wound bed as instructed Secondary Dressing ComfortFoam Border, 3x3 in (silicone border) Discharge Instruction: Apply over primary dressing as directed. Secured  With Compression Wrap Compression Stockings Environmental education officer) Signed: 12/23/2019 5:21:51 PM By: [name] MD Signed: 12/24/2019 9:59:17 AM By: Rhae Hammock RN Entered By: [name] on 12/23/2019 16:49:45 -------------------------------------------------------------------------------- Multi-Disciplinary Care Plan Details Patient Name: Date of Service: DO RN, [name] 12/23/2019 2:30 PM Medical Record Number: [id number] Patient Account Number: [id number] Date of Birth/Sex: Treating RN: 03/11/1929 (86 y.o. [name], [name] Primary Care Armoni Depass: Myriam Jacobson Other Clinician: Referring Douglas Rooks: Treating Oliviya Gilkison/Extender: Katina Degree in Treatment: 6 Active Inactive Wound/Skin Impairment Nursing Diagnoses: Knowledge deficit related to ulceration/compromised skin integrity Goals: Patient/caregiver will verbalize understanding of skin care regimen Date Initiated: 11/11/2019 Target Resolution Date: 01/10/2020 Goal Status: Active Ulcer/skin breakdown will have a volume reduction of 30% by week 4 Date Initiated: 11/11/2019 Target Resolution Date: 01/10/2020 Goal Status: Active Interventions: Assess patient/caregiver ability to obtain necessary supplies Assess patient/caregiver ability to perform ulcer/skin care regimen upon admission and as needed Assess ulceration(s) every visit Notes: Electronic Signature(s) Signed: 12/24/2019 9:59:17 AM By: Rhae Hammock RN Entered By: Rhae Hammock on 12/23/2019 15:36:16 -------------------------------------------------------------------------------- Pain Assessment Details Patient Name: Date of Service: DO RN, [name] 12/23/2019 2:30 PM Medical Record Number: [id number] Patient Account Number: [id number] Date of Birth/Sex: Treating RN: 1929/07/17 (86 y.o. [name] Primary Care Miri Jose: Myriam Jacobson Other Clinician: Referring Luv Mish: Treating Icel Castles/Extender:  Katina Degree in Treatment: 6 Active Problems Location of Pain Severity and Description of Pain Patient Has Paino No Site Locations Pain Management and Medication Current Pain Management: Electronic Signature(s) Signed: 12/23/2019 4:37:04 PM By: [name] RN Entered By: [name] on 12/23/2019 15:11:34 -------------------------------------------------------------------------------- Patient/Caregiver Education Details Patient Name: Date of Service: DO RN, [name] 12/14/2021andnbsp2:30 PM Medical Record Number: [id number] Patient Account Number: [id number] Date of Birth/Gender: Treating RN: 04-Mar-1929 (86 y.o. [name], [place] Primary Care Physician: Myriam Jacobson Other Clinician: Referring Physician: Treating Physician/Extender: Katina Degree in Treatment: 6 Education Assessment Education Provided To: Patient Education Topics Provided Wound/Skin Impairment: Methods: Explain/Verbal Responses: State content correctly Electronic Signature(s) Signed: 12/24/2019 9:59:17 AM By: Rhae Hammock RN Entered By: Rhae Hammock on 12/23/2019 15:36:35 -------------------------------------------------------------------------------- Wound Assessment Details Patient Name: Date of Service: DO RN, [name] 12/23/2019 2:30 PM Medical Record Number: [id number] Patient Account Number: [id number] Date of Birth/Sex: Treating RN: 1929-08-10 (86 y.o. [name] Primary Care Nephi Savage: Myriam Jacobson Other Clinician: Referring Areen Trautner: Treating Mckinna Demars/Extender: Katina Degree in Treatment: 6 Wound Status Wound Number: 15 Primary Pressure Ulcer Etiology: Wound Location: Sacrum Wound Open Wounding Event: Pressure Injury Status: Date Acquired: 08/01/2019 Comorbid Cataracts, Asthma, Chronic  Obstructive Pulmonary Disease Weeks Of Treatment: 6 History: (COPD), Congestive Heart Failure, Coronary Artery  Disease, Clustered Wound: No Hypertension, Peripheral Arterial Disease, Osteoarthritis, Confinement Anxiety Wound Measurements Length: (cm) 0.6 Width: (cm) 0.2 Depth: (cm) 0.3 Area: (cm) 0.094 Volume: (cm) 0.028 % Reduction in Area: 57.3% % Reduction in Volume: -27.3% Epithelialization: None Tunneling: No Undermining: Yes Starting Position (o'clock): 12 Ending Position (o'clock): 12 Maximum Distance: (cm) 0.3 Wound Description Classification: Category/Stage III Wound Margin: Epibole Exudate Amount: Small Exudate Type: Serous Exudate Color: amber Foul Odor After Cleansing: No Slough/Fibrino Yes Wound Bed Granulation Amount: Small (1-33%) Exposed Structure Granulation Quality: Pink, Pale Fascia Exposed: No Necrotic Amount: Large (67-100%) Fat Layer (Subcutaneous Tissue) Exposed: Yes Necrotic Quality: Adherent Slough Tendon Exposed: No Muscle Exposed: No Joint Exposed: No Bone Exposed: No Treatment Notes Wound #15 (Sacrum) Cleanser Wound Cleanser Discharge Instruction: Cleanse the wound with wound cleanser prior to applying a clean dressing using gauze sponges, not tissue or cotton balls. Peri-Wound Care Skin Prep Discharge Instruction: Use skin prep as directed Topical Primary Dressing IODOFLEX 0.9% Cadexomer Iodine Pad 4x6 cm Discharge Instruction: Apply to wound bed as instructed Secondary Dressing ComfortFoam Border, 3x3 in (silicone border) Discharge Instruction: Apply over primary dressing as directed. Secured With Compression Wrap Compression Stockings Environmental education officer) Signed: 12/23/2019 4:37:04 PM By: [name] RN Signed: 12/24/2019 9:59:17 AM By: Rhae Hammock RN Entered By: Rhae Hammock on 12/23/2019 15:29:20 -------------------------------------------------------------------------------- Vitals Details Patient Name: Date of Service: DO RN, [name] 12/23/2019 2:30 PM Medical Record Number: [id number] Patient  Account Number: [id number] Date of Birth/Sex: Treating RN: [birth year] (86 y.o. [name]) Dolores Lory, Morey Hummingbird Primary Care Ellysa Parrack: Myriam Jacobson Other Clinician: Referring Kiet Geer: Treating Sweta Halseth/Extender: Katina Degree in Treatment: 6 Vital Signs Time Taken: 15:09 Temperature (F): 98.7 Height (in): 69 Pulse (bpm): 91 Respiratory Rate (breaths/min): 18 Blood Pressure (mmHg): 150/73 Reference Range: 80 - 120 mg / dl Electronic Signature(s) Signed: 12/23/2019 4:37:04 PM By: [name] RN Entered By: [name] on 12/23/2019 15:11:28

## 2019-12-24 NOTE — Telephone Encounter (Signed)
Done

## 2019-12-24 NOTE — Telephone Encounter (Signed)
Change to xanax 0.5 mg tid and ok to skip a dose or take one half if too sleepy from the whole  I couldn't do this electronically so see if can phone in new rx (on med list)

## 2019-12-24 NOTE — Progress Notes (Signed)
COMMUNITY PALLIATIVE CARE RN NOTE  PATIENT NAME: [name] DOB: 1929-05-11 MRN: [id number]  PRIMARY CARE PROVIDER: [name], MD  RESPONSIBLE PARTY: Brien Few (wife) Acct ID - Guarantor Home Phone Work Phone Relationship Acct Type  [id number] - KIRTAN, SADA [phone]  Self P/F     [address] [place], [place] [address]   Due to the COVID-19 crisis, this virtual check-in visit was done via telephone from my office and it was initiated and consent by this patient and or family.  PLAN OF CARE and INTERVENTION:  1. ADVANCE CARE PLANNING/GOALS OF CARE: Goal is for patient to have improved pain in his right leg stump and remain at home with his wife.  2. PATIENT/CAREGIVER EDUCATION: Symptom management, safe transfers, s/s of infection 3. DISEASE STATUS: Virtual check-in visit completed via telephone. Patient reports a lot of discomfort in his stump. Patient had a fall the week prior to Thanksgiving. Initially, there was no bruising to the stump and no apparent injuries. He is now experiencing excruciating pain. The stump is now bruised and wife says there is a hematoma over the stump bone. He continues to work with PT, and wife says that there was one session where he wasn't feeling well and did not want to do transfers and things of this nature, but felt up to performing stump exercises. Wife says after this, he started feeling pain a few days later. She reports the pain he is experiencing is not phantom pain. She is really unsure of the cause, and he has an appointment today with the Vascular center for an assessment. OT has been placed on hold at this time. PT will be visiting this week for re-evaluation. Patient is able to transfer to his bedside commode.  He was seen by wound care yesterday, and the wound on his left heel has healed completely. He has an ulcer on his sacrum about 1/2 inch in diameter per wife, that is not improving. There has been several medication changes to try  and treat this, however it remains the same. Wife is performing dressing changes to this area every other day. He had some issues with bladder spasms early this morning and wife says he urinated out 150cc of urine that came out around his foley catheter. He does have medication PRN for these spasms. Explained how bladder spasms can cause urine to be pushed around the catheter. Last night, patient started with some weeping edema from his left lower extremity. He is currently taking Lasix 3x/day. Wife wrapped his leg. He has a tiny opening that the fluid is leaking from, but no wounds/ulcers. His foot does become painful with the swelling and he will elevate his leg to help. She inquired about hospice services, which is a discussion we often have. Continue to provide education regarding hospice. She also states that her long term care insurance does not cover in home assistance, but will cover costs if placed in a nursing home. She inquired about several facilities in the area. Provided information as requested. Will continue to monitor.   HISTORY OF PRESENT ILLNESS: This is a 86 yo male with a h/o CHF, PVD, CAD COPD, and NSTEMI. Palliative care team continues to follow patient and visits monthly and PRN    CODE STATUS: DNR ADVANCED DIRECTIVES: Y MOST FORM: no PPS: 40%   (Duration of visit and documentation 45 minutes)   [name], RN BSN

## 2019-12-24 NOTE — Telephone Encounter (Signed)
Patient wants to know if he can go to 1.5 pills a day for his xanax. And will need more ordered if so  Dr. [name] please advise.

## 2019-12-25 DIAGNOSIS — I480 Paroxysmal atrial fibrillation: Secondary | ICD-10-CM | POA: Diagnosis not present

## 2019-12-25 DIAGNOSIS — L89152 Pressure ulcer of sacral region, stage 2: Secondary | ICD-10-CM | POA: Diagnosis not present

## 2019-12-25 DIAGNOSIS — L89622 Pressure ulcer of left heel, stage 2: Secondary | ICD-10-CM | POA: Diagnosis not present

## 2019-12-25 DIAGNOSIS — I739 Peripheral vascular disease, unspecified: Secondary | ICD-10-CM | POA: Diagnosis not present

## 2019-12-25 DIAGNOSIS — I5032 Chronic diastolic (congestive) heart failure: Secondary | ICD-10-CM | POA: Diagnosis not present

## 2019-12-25 DIAGNOSIS — I110 Hypertensive heart disease with heart failure: Secondary | ICD-10-CM | POA: Diagnosis not present

## 2019-12-26 ENCOUNTER — Telehealth: Payer: Self-pay | Admitting: Interventional Cardiology

## 2019-12-26 ENCOUNTER — Other Ambulatory Visit: Payer: Self-pay | Admitting: *Deleted

## 2019-12-26 DIAGNOSIS — I480 Paroxysmal atrial fibrillation: Secondary | ICD-10-CM | POA: Diagnosis not present

## 2019-12-26 DIAGNOSIS — L89622 Pressure ulcer of left heel, stage 2: Secondary | ICD-10-CM | POA: Diagnosis not present

## 2019-12-26 DIAGNOSIS — L89152 Pressure ulcer of sacral region, stage 2: Secondary | ICD-10-CM | POA: Diagnosis not present

## 2019-12-26 DIAGNOSIS — I110 Hypertensive heart disease with heart failure: Secondary | ICD-10-CM | POA: Diagnosis not present

## 2019-12-26 DIAGNOSIS — I739 Peripheral vascular disease, unspecified: Secondary | ICD-10-CM | POA: Diagnosis not present

## 2019-12-26 DIAGNOSIS — I5032 Chronic diastolic (congestive) heart failure: Secondary | ICD-10-CM | POA: Diagnosis not present

## 2019-12-26 NOTE — Telephone Encounter (Signed)
Spoke with the patient and his wife. 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 He has edema in his left leg that the wife states fluctuates. He has been elevating his leg but was advised by vascular surgeon not to wear compression hose. The wife states that he has had some weeping from his left leg. She reports that this morning his BP was 74/47 and then 88/50. The patient had some mild dizziness. Currrent BP is 112/70 and he is asymptomatic. Patient is on palliative care. Patient saw Vascular and Vein 12/15 and they advised there was no new treatment options for his LE edema. I advised the patient to continue with elevation. He will continue to monitor his BP and let us know if it drops again. Advised not to take any additional Lasix due to low BP

## 2019-12-26 NOTE — Patient Outreach (Signed)
[facility]) Care Management  12/26/2019  [name] 03-13-1929 [id number]   Outreach attempt #3, unsuccessful, HIPAA compliant voice message left.  Will follow send unsuccessful outreach letter and follow up within the next 4 weeks.  If remain unsuccessful, will close case due to inability to maintain contact.  Valente Edgerrin, [place], MSN [place] [phone]

## 2019-12-26 NOTE — Telephone Encounter (Signed)
Pt c/o swelling: STAT is pt has developed SOB within 24 hours  1) How much weight have you gained and in what time span? Unable to be weighed since left leg amputation in august.  2) If swelling, where is the swelling located? On existing leg and foot.  3) Are you currently taking a fluid pill? Yes.  4) Are you currently SOB? Some. Has active COPD.  5) Do you have a log of your daily weights (if so, list)? No.  6) Have you gained 3 pounds in a day or 5 pounds in a week? Thinks he has gained 3 lbs in a week.  7) Have you traveled recently? No.   BP reading this morning 80/50   Patients wife is requesting an Appointment for [name] in regards to this.

## 2019-12-27 NOTE — Telephone Encounter (Signed)
Agree 

## 2019-12-29 ENCOUNTER — Other Ambulatory Visit: Payer: Self-pay | Admitting: *Deleted

## 2019-12-29 NOTE — Patient Outreach (Signed)
[facility]) Care Management  [place]  12/29/2019   [name] Oct 02, 1929 [id number]   Incoming call received from wife several missed outreach attempts.  She report member is not doing well, state he is still having pain in his stump and swelling in his left leg.  State he had a lesion on his left heal that is now healing.  Per Vascular surgery office note, if leg worsens, only option would be to amputate.  Wife is not open to this, state member would not want another amputation.  Discussed multiple recommendations of transition to hospice (already active with palliative care), she and member refuses to consider at this time.  Encouraged to consider.  Wife state member is still receiving home visits from [facility], but is wondering what additional help [facility] can provide.  Discussed referral to [facility] again (refused in the past), she is receptive of referral.  Also state member has been having issues with depression, was placed on Remeron but now sure if this is helping.  She is open to having counseling services provided by CSW.  State member will also have appointment with rehab psych provider in March.    Upcoming appointments with wound care on 12/28, rehab physician on 1/10.  Discussed transportation, state she is still able to drive member to appointments.  Encouraged to have [facility] help with transportation to lessen the caregiver stress, she declines.  State she will let this care manager know she she is ready for additional resources.  Encounter Medications:  Outpatient Encounter Medications as of 12/29/2019  Medication Sig  . ALPRAZolam (XANAX) 0.5 MG tablet Take 1 tablet (0.5 mg total) by mouth 3 (three) times daily.  Marland Kitchen aspirin 81 MG tablet Take 81 mg by mouth daily.  Marland Kitchen atorvastatin (LIPITOR) 20 MG tablet TAKE 1 TABLET BY MOUTH EVERY DAY (Patient taking differently: Take 20 mg by mouth at bedtime. )  . calcium carbonate (TUMS - DOSED IN MG ELEMENTAL  CALCIUM) 500 MG chewable tablet Chew 1 tablet (200 mg of elemental calcium total) by mouth 2 (two) times daily as needed for indigestion or heartburn.  . Cholecalciferol (VITAMIN D3) 2000 units TABS Take 2,000 Units by mouth daily with lunch.   . famotidine (PEPCID) 20 MG tablet Take 20 mg by mouth at bedtime.   . feeding supplement, ENSURE ENLIVE, (ENSURE ENLIVE) LIQD Take 237 mLs by mouth 3 (three) times daily between meals.  . finasteride (PROSCAR) 5 MG tablet Take 5 mg by mouth daily with lunch.   . fluticasone (FLONASE) 50 MCG/ACT nasal spray Place 2 sprays into both nostrils daily.  . furosemide (LASIX) 20 MG tablet Take two tablets by mouth every morning.  Take one tablet by mouth every evening.  . gabapentin (NEURONTIN) 100 MG capsule Take by mouth.  . hydrocerin (EUCERIN) CREA Apply 1 application topically 2 (two) times daily. To foot  . levalbuterol (XOPENEX HFA) 45 MCG/ACT inhaler Inhale 2 puffs into the lungs every 4 (four) hours as needed for wheezing or shortness of breath.  . levalbuterol (XOPENEX) 0.63 MG/3ML nebulizer solution Take 3 mLs (0.63 mg total) by nebulization every 4 (four) hours as needed for wheezing or shortness of breath.  . levothyroxine (SYNTHROID) 50 MCG tablet Take 50 mcg by mouth daily.  . metoprolol succinate (TOPROL-XL) 25 MG 24 hr tablet Take 0.5 tablets (12.5 mg total) by mouth daily.  . mirtazapine (REMERON) 15 MG tablet Take 15 mg by mouth at bedtime.  . mometasone (  ELOCON) 0.1 % cream Apply topically daily as needed.  . Multiple Vitamins-Minerals (PRESERVISION AREDS 2) CAPS Take 1 capsule by mouth daily.  Marland Kitchen MYRBETRIQ 25 MG TB24 tablet Take 25 mg by mouth daily.  Marland Kitchen NITROSTAT 0.4 MG SL tablet Place 0.4 mg under the tongue every 5 (five) minutes as needed for chest pain.  . OXYGEN Inhale 2 L/min into the lungs continuous.   . pantoprazole (PROTONIX) 40 MG tablet TAKE 1 TABLET(40 MG) BY MOUTH DAILY 30 TO 60 MINUTES BEFORE FIRST MEAL OF THE DAY  . Polyethyl  Glycol-Propyl Glycol (SYSTANE) 0.4-0.3 % SOLN Apply 1 drop to eye 2 (two) times daily as needed (eye irritation).  . polyethylene glycol (MIRALAX / GLYCOLAX) 17 g packet Take 17 g by mouth daily.  . potassium chloride 20 MEQ/15ML (10%) SOLN Take 20 mEq by mouth daily.  . predniSONE (DELTASONE) 10 MG tablet FOR FLARE OF COUGH/WHEEZING MAY INCREASE TO 2 DAILY BETTER, THAN 1 TABLET BY MOUTH EVERY DAY  . pregabalin (LYRICA) 50 MG capsule Take 1 capsule (50 mg total) by mouth 2 (two) times daily.  . silodosin (RAPAFLO) 8 MG CAPS capsule Take 8 mg by mouth daily.   . sodium chloride (MURO 128) 5 % ophthalmic solution Place 1 drop into both eyes 4 (four) times daily.  Marland Kitchen spironolactone (ALDACTONE) 25 MG tablet Take 25 mg by mouth daily.  . SYMBICORT 160-4.5 MCG/ACT inhaler Inhale 2 puffs into the lungs 2 (two) times daily.  . Tiotropium Bromide Monohydrate (SPIRIVA RESPIMAT) 2.5 MCG/ACT AERS INHALE 2 PUFFS BY MOUTH EVERY DAY (Patient taking differently: Inhale 2 puffs into the lungs daily. )  . vitamin B-12 (CYANOCOBALAMIN) 1000 MCG tablet Take 1,000 mcg by mouth daily.   . Zinc Oxide (TRIPLE PASTE) 12.8 % ointment Apply topically as needed for irritation. To gluteal cleft/buttocks   No facility-administered encounter medications on file as of 12/29/2019.    Functional Status:  In your present state of health, do you have any difficulty performing the following activities: 11/20/2019 08/14/2019  Hearing? N Y  Vision? N N  Difficulty concentrating or making decisions? N N  Walking or climbing stairs? Y N  Comment Chronic Leg Pain -  Dressing or bathing? N N  Doing errands, shopping? N N  Preparing Food and eating ? N -  Using the Toilet? N -  In the past six months, have you accidently leaked urine? N -  Do you have problems with loss of bowel control? N -  Managing your Medications? N -  Managing your Finances? N -  Housekeeping or managing your Housekeeping? N -  Some recent data might be  hidden    Fall/Depression Screening: Fall Risk  12/16/2019 11/20/2019 11/20/2019  Falls in the past year? 1 0 0  Comment Fell at home in Nov 2021 - per wife cuts on body - no medical attention needed - -  Number falls in past yr: 0 0 0  Injury with Fall? 0 0 0  Risk Factor Category  - - -  Risk for fall due to : - Impaired mobility Impaired mobility  Follow up - Education provided;Falls prevention discussed Education provided;Falls prevention discussed   PHQ 2/9 Scores 12/16/2019 11/20/2019 11/20/2019 10/20/2019 06/02/2019 02/26/2017 10/02/2016  PHQ - 2 Score 2 1 1 4  - 2 4  PHQ- 9 Score - - - 14 - 6 11  Exception Documentation - - - - Other- indicate reason in comment box - -    Goals Addressed  This Visit's Progress   . [facility] - Learn More About My Health   On track    Follow Up Date 01/23/2020  Timeframe:  Long-Range Goal Priority:  Medium Start Date:            12/29/2019                 Expected End Date:    02/29/2020                     - make a list of questions - ask questions - speak up when I don't understand    Why is this important?   The best way to learn about your health and care is by talking to the doctor and nurse.  They will answer your questions and give you information in the way that you like best.    Notes:  Will report understanding of progression and recovery from amputation  12/20 - Referrals placed for [facility]W and [facility] to encouraged support in the home and counseling    . [facility] - Make and Keep All Appointments   On track    Follow Up Date 01/23/2020  Timeframe:  Short-Term Goal Priority:  High Start Date:       12/29/2019                      Expected End Date:    01/29/2020                     - call to cancel if needed - keep a calendar with appointment dates    Why is this important?   Part of staying healthy is seeing the doctor for follow-up care.  If you forget your appointments, there are some things you can do to stay on  track.    Notes:   11/11 - Referral to CSW for transportation resources  12/20 - Reviewed upcoming appointments with wife        Plan:  Follow-up:  Patient agrees to Care Plan and Follow-up.  Will follow up with wife within the next month.  Will place referral to CSW and [facility].  Valente Mylez, [place], MSN [place] [phone]

## 2019-12-30 DIAGNOSIS — I110 Hypertensive heart disease with heart failure: Secondary | ICD-10-CM | POA: Diagnosis not present

## 2019-12-30 DIAGNOSIS — I5032 Chronic diastolic (congestive) heart failure: Secondary | ICD-10-CM | POA: Diagnosis not present

## 2019-12-30 DIAGNOSIS — I739 Peripheral vascular disease, unspecified: Secondary | ICD-10-CM | POA: Diagnosis not present

## 2019-12-30 DIAGNOSIS — L89152 Pressure ulcer of sacral region, stage 2: Secondary | ICD-10-CM | POA: Diagnosis not present

## 2019-12-30 DIAGNOSIS — L89622 Pressure ulcer of left heel, stage 2: Secondary | ICD-10-CM | POA: Diagnosis not present

## 2019-12-30 DIAGNOSIS — I480 Paroxysmal atrial fibrillation: Secondary | ICD-10-CM | POA: Diagnosis not present

## 2019-12-31 DIAGNOSIS — I5032 Chronic diastolic (congestive) heart failure: Secondary | ICD-10-CM | POA: Diagnosis not present

## 2019-12-31 DIAGNOSIS — I480 Paroxysmal atrial fibrillation: Secondary | ICD-10-CM | POA: Diagnosis not present

## 2019-12-31 DIAGNOSIS — I739 Peripheral vascular disease, unspecified: Secondary | ICD-10-CM | POA: Diagnosis not present

## 2019-12-31 DIAGNOSIS — I110 Hypertensive heart disease with heart failure: Secondary | ICD-10-CM | POA: Diagnosis not present

## 2019-12-31 DIAGNOSIS — L89622 Pressure ulcer of left heel, stage 2: Secondary | ICD-10-CM | POA: Diagnosis not present

## 2019-12-31 DIAGNOSIS — L89152 Pressure ulcer of sacral region, stage 2: Secondary | ICD-10-CM | POA: Diagnosis not present

## 2020-01-01 ENCOUNTER — Other Ambulatory Visit: Payer: Self-pay | Admitting: *Deleted

## 2020-01-01 ENCOUNTER — Encounter: Payer: Self-pay | Admitting: *Deleted

## 2020-01-01 NOTE — Patient Outreach (Signed)
[facility]) Care Management  01/01/2020  KELVIS BERGER 05-26-1929 [id number]  CSW was able to make initial contact with patient and patient's wife, Welles Walthall today, to perform the phone assessment on patient, as well as assess and assist with social work needs and services.  CSW introduced self, explained role and types of services provided through [facility] ([facility]).  CSW further explained to patient and Mrs. Kauk that [place] works with patient's RNCM, Valente Valerian also with [facility].  CSW then explained the reason for the call, indicating that [name] thought that patient would benefit from social work services and resources to assist with counseling and supportive services for symptoms of depression.  CSW obtained two HIPAA compliant identifiers from patient, which included patient's name and date of birth.  Patient started off the conversation by conversing directly with CSW, but was unable to hear CSW properly, getting frustrated and handing the phone to Mrs. Ermis.  Mrs. Franca admitted that patient is experiencing symptoms of Depression, and was recently prescribed Remeron 15 MG, PO, Daily, but stopped taking it after only 2 doses, "not liking the way it made him feel".  Mrs. Salway reported that patient indicated that he wanted to get through the holiday season, then plans to begin taking the Remeron again, exactly as prescribed.  CSW explained to Mrs. Roker the importance of patient not abruptly stopping a psychotropic medication, stating all the various side effects that patient may experience, as a result.  Mrs. Mccarry further reported that patient has an appointment with a Doctor of Psychology, Ilean Skill with [facility], but that the appointment is not scheduled until Thursday, March 25, 2020 at 1:00PM.  In the meantime, CSW offered to provide free telephonic counseling  services to patient, for which he adamantly declined.  CSW overheard patient in the background stating, "I'm not going to see just any old quack".  CSW explained her credentials and the fact that [facility] is licensed to provide clinical counseling, but Mrs. Richens stated, "Grace is a Editor, commissioning and will only work with physicians, such as a Engineer, materials.  [name] voiced understanding, encouraging patient and Mrs. Walkup to consider receiving counseling services from [facility], at least until patient is established with Dr. Sima Matas.  Both agreed to give it some consideration, requesting that CSW contact them again in a few weeks so that they will have had time to make a decision.  CSW was certainly agreeable to this plan, agreeing to contact them again on Friday, January 16, 2020, around 9:00AM.  CSW was able to confirm that they have the correct contact information for CSW, encouraging them to contact CSW directly if they change their minds, or if additional social work needs arise in the meantime.  [name], BSW, MSW, LCSW  Licensed Education officer, [facility]  M[address] [place] [address] [place], [place] [address] Physical Address-[address], [place], [place] [address] Toll Free Main # [phone] Fax # [phone] Cell # [phone]  [name]

## 2020-01-04 DIAGNOSIS — N139 Obstructive and reflux uropathy, unspecified: Secondary | ICD-10-CM | POA: Diagnosis not present

## 2020-01-04 DIAGNOSIS — I739 Peripheral vascular disease, unspecified: Secondary | ICD-10-CM | POA: Diagnosis not present

## 2020-01-04 DIAGNOSIS — I87322 Chronic venous hypertension (idiopathic) with inflammation of left lower extremity: Secondary | ICD-10-CM | POA: Diagnosis not present

## 2020-01-04 DIAGNOSIS — R531 Weakness: Secondary | ICD-10-CM | POA: Diagnosis not present

## 2020-01-04 DIAGNOSIS — R339 Retention of urine, unspecified: Secondary | ICD-10-CM | POA: Diagnosis not present

## 2020-01-04 DIAGNOSIS — Z9981 Dependence on supplemental oxygen: Secondary | ICD-10-CM | POA: Diagnosis not present

## 2020-01-04 DIAGNOSIS — I110 Hypertensive heart disease with heart failure: Secondary | ICD-10-CM | POA: Diagnosis not present

## 2020-01-04 DIAGNOSIS — Z466 Encounter for fitting and adjustment of urinary device: Secondary | ICD-10-CM | POA: Diagnosis not present

## 2020-01-04 DIAGNOSIS — L89152 Pressure ulcer of sacral region, stage 2: Secondary | ICD-10-CM | POA: Diagnosis not present

## 2020-01-04 DIAGNOSIS — I480 Paroxysmal atrial fibrillation: Secondary | ICD-10-CM | POA: Diagnosis not present

## 2020-01-04 DIAGNOSIS — Z89611 Acquired absence of right leg above knee: Secondary | ICD-10-CM | POA: Diagnosis not present

## 2020-01-04 DIAGNOSIS — E871 Hypo-osmolality and hyponatremia: Secondary | ICD-10-CM | POA: Diagnosis not present

## 2020-01-04 DIAGNOSIS — I5032 Chronic diastolic (congestive) heart failure: Secondary | ICD-10-CM | POA: Diagnosis not present

## 2020-01-04 DIAGNOSIS — D649 Anemia, unspecified: Secondary | ICD-10-CM | POA: Diagnosis not present

## 2020-01-04 DIAGNOSIS — I2510 Atherosclerotic heart disease of native coronary artery without angina pectoris: Secondary | ICD-10-CM | POA: Diagnosis not present

## 2020-01-04 DIAGNOSIS — R262 Difficulty in walking, not elsewhere classified: Secondary | ICD-10-CM | POA: Diagnosis not present

## 2020-01-04 DIAGNOSIS — J9611 Chronic respiratory failure with hypoxia: Secondary | ICD-10-CM | POA: Diagnosis not present

## 2020-01-06 ENCOUNTER — Other Ambulatory Visit: Payer: Self-pay

## 2020-01-06 ENCOUNTER — Encounter (HOSPITAL_BASED_OUTPATIENT_CLINIC_OR_DEPARTMENT_OTHER): Payer: Medicare Other | Admitting: Internal Medicine

## 2020-01-06 DIAGNOSIS — Z89611 Acquired absence of right leg above knee: Secondary | ICD-10-CM | POA: Diagnosis not present

## 2020-01-06 DIAGNOSIS — L89153 Pressure ulcer of sacral region, stage 3: Secondary | ICD-10-CM | POA: Diagnosis not present

## 2020-01-06 NOTE — Progress Notes (Signed)
Travis, [name] ([id number]) Visit Report for 01/06/2020 Fall Risk Assessment Details Patient Name: Date of Service: DO RN, [place] V ID L. 01/06/2020 2:00 PM Medical Record Number: [id number] Patient Account Number: [id number] Date of Birth/Sex: Treating RN: February 22, 1929 (86 y.o. [name] Primary Care [name]: Gildardo Cranker Other Clinician: Referring [name]: Treating [name]/Extender: Quin Hoop in Treatment: 8 Fall Risk Assessment Items Have you had 2 or more falls in the last 12 monthso 0 No Have you had any fall that resulted in injury in the last 12 monthso 0 Yes FALLS RISK SCREEN History of falling - immediate or within 3 months 25 Yes Secondary diagnosis (Do you have 2 or more medical diagnoseso) 15 Yes Ambulatory aid None/bed rest/wheelchair/nurse 0 Yes Crutches/cane/walker 0 No Furniture 0 No Intravenous therapy Access/Saline/Heparin Lock 0 No Gait/Transferring Normal/ bed rest/ wheelchair 0 Yes Weak (short steps with or without shuffle, stooped but able to lift head while walking, may seek 0 No support from furniture) Impaired (short steps with shuffle, may have difficulty arising from chair, head down, impaired 0 No balance) Mental Status Oriented to own ability 0 No Electronic Signature(s) Signed: 01/06/2020 6:26:47 PM By: Shawn Stall Entered By: Shawn Stall on 01/06/2020 14:41:57

## 2020-01-07 DIAGNOSIS — I480 Paroxysmal atrial fibrillation: Secondary | ICD-10-CM | POA: Diagnosis not present

## 2020-01-07 DIAGNOSIS — I5032 Chronic diastolic (congestive) heart failure: Secondary | ICD-10-CM | POA: Diagnosis not present

## 2020-01-07 DIAGNOSIS — J9611 Chronic respiratory failure with hypoxia: Secondary | ICD-10-CM | POA: Diagnosis not present

## 2020-01-07 DIAGNOSIS — L89152 Pressure ulcer of sacral region, stage 2: Secondary | ICD-10-CM | POA: Diagnosis not present

## 2020-01-07 DIAGNOSIS — I739 Peripheral vascular disease, unspecified: Secondary | ICD-10-CM | POA: Diagnosis not present

## 2020-01-07 DIAGNOSIS — I110 Hypertensive heart disease with heart failure: Secondary | ICD-10-CM | POA: Diagnosis not present

## 2020-01-07 NOTE — Progress Notes (Signed)
Rhudy, Ediel [name] ([id number]) Visit Report for 01/06/2020 Debridement Details Patient Name: Date of Service: DO RN, [place] V ID L. 01/06/2020 2:00 PM Medical Record Number: [id number] Patient Account Number: [id number] Date of Birth/Sex: Treating RN: 1929/02/08 (86 y.o. [name] Primary Care Provider: [name] Other Clinician: Referring Provider: Treating Provider/Extender: [name] in Treatment: 8 Debridement Performed for Assessment: Wound #15 Sacrum Performed By: Physician Ricard Dillon., MD Debridement Type: Debridement Level of Consciousness (Pre-procedure): Awake and Alert Pre-procedure Verification/Time Out Yes - 15:05 Taken: Start Time: 15:05 Pain Control: Lidocaine 5% topical ointment T Area Debrided (L x W): otal 0.5 (cm) x 0.3 (cm) = 0.15 (cm) Tissue and other material debrided: Viable, Non-Viable, Slough, Subcutaneous, Skin: Epidermis, Slough Level: Skin/Subcutaneous Tissue Debridement Description: Excisional Instrument: Curette Bleeding: Minimum Hemostasis Achieved: Pressure End Time: 15:10 Procedural Pain: 0 Post Procedural Pain: 0 Response to Treatment: Procedure was tolerated well Level of Consciousness (Post- Awake and Alert procedure): Post Debridement Measurements of Total Wound Length: (cm) 0.5 Stage: Category/Stage III Width: (cm) 0.3 Depth: (cm) 0.2 Volume: (cm) 0.024 Character of Wound/Ulcer Post Debridement: Requires Further Debridement Post Procedure Diagnosis Same as Pre-procedure Electronic Signature(s) Signed: 01/06/2020 6:35:32 PM By: Baruch Gouty RN, BSN Signed: 01/07/2020 12:07:59 PM By: [name] MD Entered By: [name] on 01/06/2020 15:20:38 -------------------------------------------------------------------------------- HPI Details Patient Name: Date of Service: DO RN, [name] 01/06/2020 2:00 PM Medical Record Number: [id number] Patient Account Number: [id number] Date of  Birth/Sex: Treating RN: 04-23-1929 (86 y.o. [name] Primary Care Provider: [name] Other Clinician: Referring Provider: Treating Provider/Extender: [name] in Treatment: 8 History of Present Illness HPI Description: 01/30/18 on evaluation today patient presents for initial inspection concerning the skin tear of the right forearm. Fortunately there does not appear to be any evidence of infection and this occurred approximately three days ago. He has been using Vaseline over the region. With that being said the patient does have a history of cataracts COPD, hypertension, peripheral arterial disease, and osteoarthritis. He tells me that he does have a little bit of pain at the site but nothing too significant at this time which is good news. Fortunately this overall appears to be doing fairly well which is good news. No fevers, chills, nausea, or vomiting noted at this time. The biggest issue at this point is that the wound is of quite significant size. READMISSION 04/21/2019 This is a 86 year old man with multiple medical problems who was seen once in the clinic here in January 2020 with a skin tear on his right forearm seen by [name]. This problem this time started sometime recently. He was noted by podiatry to have erosions and wounds of his right foot on toes 1-3. He also saw [name] of dermatology who gave him Silvadene cream for what I think was felt to be a stasis dermatitis of his bilateral lower legs. At some point he was referred to [name]. He was also noted to have previous noninvasive arterial studies that showed no flow to either one of his toes and noncompressible ABIs bilaterally. [name] did noninvasive arterial studies on him. These showed that he had greater than 75% stenosis in the proximal and mid common femoral artery. Monophasic flow in the posterior tibial and dorsalis pedis positions on the right foot. ABIs again were  noncompressible. He was felt to have total occlusion of the superficial femoral artery on the right. The overall interpretation is that  he had severe multilevel arterial occlusive disease. Absent femoral pulses and superficial femoral artery occlusions. In spite of this he was not felt to be candidate for arteriography given his bilateral femoral artery occlusions as well he would not be a candidate for endovascular approach secondary to his common femoral artery occlusions and proximal iliac disease. He was not felt to be a candidate for an open infra inguinal bypass. He therefore felt he would need to be monitored over time with the only option being a right below-knee amputation The patient complains currently of pain at night when he is up in bed. The pain in the right foot is better when he puts the leg down over the bed side. This is compatible with rest pain. He has very limited activity i.e. is even exhausted getting dressed in the morning because of cardiopulmonary issues therefore it is difficult to gauge claudication I believe he was referred here by Dr. Jorja Loa with one of the major issues is whether he would be a candidate for hyperbaric oxygen. He is not a diabetic. Dr. Jorja Loa did give him Silvadene cream for dry flaking skin in his bilateral lower extremities, however the patient is concerned about using this in the face of sulfa allergy. 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 This is on the medial aspect extended there is a second area on the plantar aspect. Most of what the patient talks about is claudication with minimal activity [however the patient is  limited by his COPD], or even at rest at night. He is not getting a lot of sleep. We use silver collagen on this wound 5/4; the patient has a wound on the tip of his right great toe also on the medial aspect. Setting of severe nonrevascularizable PAD [see description in HPI]. He does not describe any change in his pain. We have been using silver collagen 5/18; 2-week follow-up. The patient has wound on the tip of his right great toe as well as the medial aspect. He has developed new wounds x2 on the right lateral foot and the right lateral calcaneus. These look like ischemic areas. The patient talks about claudication at night. He seems to be more comfortable during the day. We have been using silver collagen to the wounds. I have previously debrided the eschar on the toes however this comes right back. 6/1; 2-week follow-up. This patient has ischemic wounds on his right foot in the setting of nonunreconstructable PAD [previously reviewed by Dr. Dixon]. He does not really describe severe pain but he does have episodic pain in the right great toe. He has an area on the tip of the right great toe as well as the medial aspect of the right great toe. A new area on the tip of the right fourth toe. He has the area on the right lateral foot fortunately the area on the right lateral heel appears to have epithelialized over. We are using silver collagen to all wounds 06/23/2019 upon evaluation today patient appears to be doing about the same in regard to his foot ulcers. Unfortunately he has 2 new skin tears on his left elbow and the right forearm that were new as of today. He states  that it was doing okay until they went to change the dressing and they put some collagen on it that got stuck and somewhat pulled back the skin unfortunately. Fortunately there is no signs of active infection at this time. No fevers, chills, nausea, vomiting, or diarrhea. 6/29; the patient was hospitalized from 6/18 through 6/23  going in with increasing drainage from the wounds on his right foot. He underwent a angiogram on 06/30/2019 by Dr. Donzetta Matters which revealed multilevel arterial disease including occlusion of the right common femoral artery. He reconstitutes the profunda femoris and the below-knee popliteal artery and appears to have posterior tibial peroneal runoff to the ankle although there is heavy calcification of the popliteal below the knee. He was offered consideration of a right above-knee amputation, palliative and/or hospice care and consideration of a very high risk attempt at bypass. He did receive IV antibiotics. He comes in the clinic today again with the 2 areas that are necrotic on the left great toe and a small punched out area on the right lateral foot. A lot of the erythema on top of the right foot but no warmth. He has a lot of swelling here. The patient is literally tortured over this decision that he needs to make. I went over this with him in exceptional detail. I do not believe he is a candidate for any form of open bypass surgery. I think his vascular doctors would agree with that. He still has pain at night when he puts his leg up on the bed that is relieved by putting his leg dependent. This is not different from what I remember. He is not septic there is no additional skin breakdown that I can see 7/8 the patient continues to have severe pain in the right foot at night making it difficult for him to rest. There is increasing erythema in the right foot but the foot is cold. I do not think this is an infection I think this is likely to be tissue necrosis from ischemia. I thought he would have made an up decision today about going forward with hospice he has not. He did not go to see [name] on 7/6. Again he wants to talk to me at length about amputations. I do not think he will do well after an above-knee amputation on the right. Furthermore I do not think he is a candidate for bypass surgery on  the right leg 7/20; patient still having a lot of pain in the right foot. He saw Dr. Donzetta Matters on 7/16. He is not felt to be able to undergo a complex revascularization and I certainly agree with that. He was offered a right below-knee amputation but he has not made that decision. I have repetitively suggested hospice care if he will not undergo an amputation he is not decided he wants to go that route either. He arrives in the clinic today accompanied by his son who is a Stage manager and his wife. I think the patient has tissue breakdown in the dorsal foot secondary to ischemia although his son brought up that some of the erythema could be a contact dermatitis as they were apparently layering a large area of pure call over the wound area. I was not aware of this not supposed to be doing it in this fashion. In any case the erythema look better than last week and I am quite convinced this is not cellulitis READMISSION 11/11/2019 Mr. Paula Libra is now an 86 year old man who we had in this  clinic from [place] through July/21 largely as a result of ischemic wounds in his right foot. He was followed by vein and vascular [name]. His anatomy was not felt to be amenable to a revascularization. The patient had increasing ischemic pain but was very resistant to the notion of an amputation. Eventually he did go through with this on August 22 he will underwent an AKA by [name]. He went to rehabilitation. There he developed a small stage III lower sacral pressure ulcer and an area on his left lateral heel which is also probably a pressure ulcer. They have been using silver collagen on both wound areas. The patient states he is not in any unrelieved pain. He is in a wheelchair at home with a Roho cushion. He is able to do his own transfers. I think he is probably putting far too much pressure on these areas Past medical history is reviewed. He has a history of CHF, severe PAD, hypertension, coronary artery disease  status post CABG, COPD Gold stage IV on O2, hypothyroidism and a recent problem with urinary retention he now has a Foley catheter. He has had problems with left leg swelling and adjustment of his diuretics he is followed by Dr. Daneen Schick. The patient has severe PAD. On 06/30/2019 he had an angiogram on the remaining left side he had heavily diseased femoral artery. His superficial femoral artery was flush occluded with runoff in the profunda. He was felt his SFA was likely occluded and reconstitutes the popliteal artery via runoff via the peroneal and posterior tibial artery. As noted he is not in a lot of pain. His last noninvasive study was on 6/20. On the left side this was noncompressible ABI but a TBI of only 0.20 with dampened monophasic waveforms 11/25/2019. Patient has 2 wound areas 1 on the left lateral heel and a ulcer on his coccyx. These are probably all pressure related although he has significant PAD complicating any attempt to heal a wound on the left heel. We have been using silver collagen on the wound and the coccyx silver alginate on the heel. 11/30; miraculously the left lateral heel wound has healed. The area on his coccyx is not much better. We have been using Prisma He apparently went back to see [name]. His wife paraphrasing says that he had terrible blood flow in the left lower extremity but he is not a candidate for revascularization. Fortunately had enough blood flow to heal the wound I will try to look at Dr. Mee Hives note by the time these next year 12/14; the area on his left lateral heel remains closed The area on his coccyx is about the same in terms of surface area and depth minimal debris however but it is still adherent. We have been using Iodoflex. They stop me before I left the room to show me his right AKA stump. He tells me he is having a lot of pain in this area that is episodic it almost looks like there is bruising in this area but the bruising looks old I  asked him about trauma but they were not aware of any. They see vascular tomorrow 12/28; the area on the coccyx looks absolutely no better. Completely lifeless surface of fibrinous debris and surrounding raised skin. We have been using Iodoflex this is simply not working. He had a fall when he got up forgetting that he was missing his right leg he has skin tears on the right humerus area just above the elbow  and on the right forearm. Electronic Signature(s) Signed: 01/07/2020 12:07:59 PM By: [name] MD Entered By: [name] on 01/06/2020 15:21:44 -------------------------------------------------------------------------------- Physical Exam Details Patient Name: Date of Service: DO RN, [name] 01/06/2020 2:00 PM Medical Record Number: [id number] Patient Account Number: [id number] Date of Birth/Sex: Treating RN: 08/05/1929 (86 y.o. [name] Primary Care Provider: [name] Other Clinician: Referring Provider: Treating Provider/Extender: [name] in Treatment: 8 Constitutional Sitting or standing Blood Pressure is within target range for patient.. Pulse regular and within target range for patient.Marland Kitchen Respirations regular, non-labored and within target range.. Temperature is normal and within the target range for the patient.Marland Kitchen Appears in no distress. Notes Wound exam Left lateral heel remains closed we check this today The coccyx just does not look any better at all. Drying lifeless surface fibrinous debris. I remove this in a #3 curette some of the surrounding thick skin. Hemostasis with direct pressure. He has skin tears on his left upper arm and left forearm. We will use Xeroform to this area Electronic Signature(s) Signed: 01/07/2020 12:07:59 PM By: [name] MD Entered By: [name] on 01/06/2020 15:22:58 -------------------------------------------------------------------------------- Physician Orders Details Patient  Name: Date of Service: DO RN, [name] 01/06/2020 2:00 PM Medical Record Number: [id number] Patient Account Number: [id number] Date of Birth/Sex: Treating RN: Apr 23, 1929 (86 y.o. 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 Bathing/ Shower/ Hygiene May shower and wash wound with soap and water. - with dressing changes only. Off-Loading Turn and reposition every 2 hours [place] wound care orders this week; continue Home Health for wound care. May utilize formulary equivalent dressing for wound treatment orders unless otherwise specified. Other Home Health Orders/Instructions: - [facility]ound Treatment Wound #15 - Sacrum Cleanser: Wound Cleanser (Home Health) 1 x Per Day/30 Days Discharge Instructions: Cleanse the wound with wound cleanser prior to applying a clean dressing using gauze sponges, not tissue or cotton balls. Peri-Wound Care: Skin Prep (Home Health) 1 x Per Day/30 Days Discharge Instructions: Use skin prep as directed Prim Dressing: Santyl Ointment ([place]) 1 x Per Day/30 Days ary Discharge Instructions: Apply nickel thick amount to wound bed as instructed Secondary Dressing: ComfortFoam Border, 3x3 in (silicone border) ([place]) 1 x Per Day/30 Days Discharge Instructions: Apply over primary dressing as directed. Wound #16 - Upper Arm Wound Laterality: Left, Proximal Cleanser: Normal Saline 2 x Per Week/30 Days Discharge Instructions: Cleanse the wound with Normal Saline prior to applying a clean dressing using gauze sponges, not tissue or cotton balls. Prim Dressing: Xeroform Occlusive Gauze  Dressing, 4x4 in (Home Health) 2 x Per Week/30 Days ary Discharge Instructions: Apply to wound bed as instructed Secondary Dressing: Woven Gauze Sponge, Non-Sterile 4x4 in (Home Health) 2 x Per Week/30 Days Discharge Instructions: Apply over primary dressing as directed. Secured With: [employer], 4.5x3.1 (in/yd) (Home Health) 2 x Per Week/30 Days Discharge Instructions: Secure with Kerlix as directed. Secured With: Paper Tape, 2x10 (in/yd) (Home Health) 2 x Per Week/30 Days Discharge Instructions: Secure dressing with tape as directed. Wound #17 - Upper Arm Wound Laterality: Left, Distal Cleanser: Normal Saline 2 x Per Week/30 Days Discharge Instructions: Cleanse the wound  with Normal Saline prior to applying a clean dressing using gauze sponges, not tissue or cotton balls. Prim Dressing: Xeroform Occlusive Gauze Dressing, 4x4 in (Home Health) 2 x Per Week/30 Days ary Discharge Instructions: Apply to wound bed as instructed Secondary Dressing: Woven Gauze Sponge, Non-Sterile 4x4 in (Home Health) 2 x Per Week/30 Days Discharge Instructions: Apply over primary dressing as directed. Secured With: [employer], 4.5x3.1 (in/yd) (Home Health) 2 x Per Week/30 Days Discharge Instructions: Secure with Kerlix as directed. Secured With: Paper Tape, 2x10 (in/yd) (Home Health) 2 x Per Week/30 Days Discharge Instructions: Secure dressing with tape as directed. Electronic Signature(s) Signed: 01/06/2020 6:35:32 PM By: Baruch Gouty RN, BSN Signed: 01/07/2020 12:07:59 PM By: [name] MD Previous Signature: 01/06/2020 3:16:31 PM Version By: [name] MD Entered By: Baruch Gouty on 01/06/2020 15:18:14 -------------------------------------------------------------------------------- Problem List Details Patient Name: Date of Service: DO RN, [name] 01/06/2020 2:00 PM Medical Record Number: [id number] Patient Account Number: [id number] Date of Birth/Sex: Treating  RN: [birth year] (86 y.o. 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 01/06/2020 2:00 PM Medical Record Number: [id number] Patient Account Number: [id number] Date of Birth/Sex: Treating RN: 05-19-1929 (86 y.o. [name] Primary Care Provider: [name] Other Clinician: Referring Provider: Treating Provider/Extender: [name] in Treatment: 8 Subjective History of Present Illness (HPI) 01/30/18 on evaluation today patient presents for initial inspection concerning the skin tear of the right forearm. Fortunately there does not appear to be any evidence of infection and this occurred approximately three days ago. He has been using Vaseline over the region. With that being said the patient does have a history of cataracts COPD, hypertension, peripheral arterial disease, and osteoarthritis. He tells me that he does have a little bit of pain at the site but nothing too significant at this time which is good news. Fortunately this overall appears to be doing fairly well which is good news. No fevers, chills, nausea,  or vomiting noted at this time. The biggest issue at this point is that the wound is of quite significant size. READMISSION 04/21/2019 This is a 86 year old man with multiple medical problems who was seen once in the clinic here in January 2020 with a skin tear on his right forearm seen by [name]. This problem this time started sometime recently. He was noted by podiatry to have erosions and wounds of his right foot on toes 1-3. He also saw [name] of dermatology who gave him Silvadene cream for what I think was felt to be a stasis dermatitis of his bilateral lower legs. At some point he was referred to [name]. He was also noted to have previous noninvasive arterial studies that showed no flow to either one of his toes and noncompressible ABIs bilaterally. [name] did noninvasive arterial studies on him. These showed that  he had greater than 75% stenosis in the proximal and mid common femoral artery. Monophasic flow in the posterior tibial and dorsalis pedis positions on the right foot. ABIs again were noncompressible. He was felt to have total occlusion of the superficial femoral artery on the right. The overall interpretation is that he had severe multilevel arterial occlusive disease. Absent femoral pulses and superficial femoral artery occlusions. In spite of this he was not felt to be candidate for arteriography given his bilateral femoral artery occlusions as well he would not be a candidate for endovascular approach secondary to his common femoral artery occlusions and proximal iliac disease. He was not felt to be a candidate for an open infra inguinal bypass. He therefore felt he would need to be monitored over time with the only option being a right below-knee amputation The patient complains currently of pain at night when he is up in bed. The pain in the right foot is better when he puts the leg down over the bed side. This is compatible with rest pain. He has very limited  activity i.e. is even exhausted getting dressed in the morning because of cardiopulmonary issues therefore it is difficult to gauge claudication I believe he was referred here by Dr. [name] with one of the major issues is whether he would be a candidate for hyperbaric oxygen. He is not a diabetic. Dr. [name] did give him Silvadene cream for dry flaking skin in his bilateral lower extremities, however the patient is concerned about using this in the face of sulfa allergy. 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 This is on the medial aspect extended there is a second area on the plantar aspect. Most of what the patient talks about is claudication with minimal activity [however the patient is limited by his COPD], or even at rest at night. He is not getting a lot of sleep. We use silver collagen on this wound 5/4; the patient has a wound on the tip of his right great toe also on the medial aspect. Setting of severe nonrevascularizable PAD [see description in HPI]. He does not describe any change in his pain. We have been using silver collagen 5/18; 2-week follow-up. The patient has wound on the tip of his right great toe as well as the medial aspect. He has developed new wounds x2 on the right lateral foot and the right lateral calcaneus. These look like ischemic areas. The patient talks about claudication at night. He seems to be more comfortable during the day. We have been using silver collagen to the wounds. I have previously debrided the eschar on the toes however this comes right back. 6/1; 2-week follow-up. This patient has ischemic wounds on his right foot in the  setting of nonunreconstructable PAD [previously reviewed by Dr. Dixon]. He does not really describe severe pain but he does have episodic pain in the right great toe. He has an area on the tip of the right great toe as well as the medial aspect of the right great toe. A new area on the tip of the right fourth toe. He has the area on the right lateral foot fortunately the area on the right lateral heel appears  to have epithelialized over. We are using silver collagen to all wounds 06/23/2019 upon evaluation today patient appears to be doing about the same in regard to his foot ulcers. Unfortunately he has 2 new skin tears on his left elbow and the right forearm that were new as of today. He states that it was doing okay until they went to change the dressing and they put some collagen on it that got stuck and somewhat pulled back the skin unfortunately. Fortunately there is no signs of active infection at this time. No fevers, chills, nausea, vomiting, or diarrhea. 6/29; the patient was hospitalized from 6/18 through 6/23 going in with increasing drainage from the wounds on his right foot. He underwent a angiogram on 06/30/2019 by Dr. Donzetta Matters which revealed multilevel arterial disease including occlusion of the right common femoral artery. He reconstitutes the profunda femoris and the below-knee popliteal artery and appears to have posterior tibial peroneal runoff to the ankle although there is heavy calcification of the popliteal below the knee. He was offered consideration of a right above-knee amputation, palliative and/or hospice care and consideration of a very high risk attempt at bypass. He did receive IV antibiotics. He comes in the clinic today again with the 2 areas that are necrotic on the left great toe and a small punched out area on the right lateral foot. A lot of the erythema on top of the right foot but no warmth. He has a lot of swelling here. The patient is literally tortured over this  decision that he needs to make. I went over this with him in exceptional detail. I do not believe he is a candidate for any form of open bypass surgery. I think his vascular doctors would agree with that. He still has pain at night when he puts his leg up on the bed that is relieved by putting his leg dependent. This is not different from what I remember. He is not septic there is no additional skin breakdown that I can see 7/8 the patient continues to have severe pain in the right foot at night making it difficult for him to rest. There is increasing erythema in the right foot but the foot is cold. I do not think this is an infection I think this is likely to be tissue necrosis from ischemia. I thought he would have made an up decision today about going forward with hospice he has not. He did not go to see [name] on 7/6. Again he wants to talk to me at length about amputations. I do not think he will do well after an above-knee amputation on the right. Furthermore I do not think he is a candidate for bypass surgery on the right leg 7/20; patient still having a lot of pain in the right foot. He saw Dr. Donzetta Matters on 7/16. He is not felt to be able to undergo a complex revascularization and I certainly agree with that. He was offered a right below-knee amputation but he has not made that decision. I have repetitively suggested hospice care if he will not undergo an amputation he is not decided he wants to go that route either. He arrives in the clinic today accompanied by his son who is a Stage manager and his wife. I think the patient has tissue breakdown in the dorsal foot secondary to ischemia although his son brought up that some of the erythema could be a contact dermatitis as they were apparently layering a large area of pure call  over the wound area. I was not aware of this not supposed to be doing it in this fashion. In any case the erythema look better than last week and I am quite convinced this is  not cellulitis READMISSION 11/11/2019 Mr. Paula Libra is now an 86 year old man who we had in this clinic from [place] through July/21 largely as a result of ischemic wounds in his right foot. He was followed by vein and vascular [name]. His anatomy was not felt to be amenable to a revascularization. The patient had increasing ischemic pain but was very resistant to the notion of an amputation. Eventually he did go through with this on August 22 he will underwent an AKA by [name]. He went to rehabilitation. There he developed a small stage III lower sacral pressure ulcer and an area on his left lateral heel which is also probably a pressure ulcer. They have been using silver collagen on both wound areas. The patient states he is not in any unrelieved pain. He is in a wheelchair at home with a Roho cushion. He is able to do his own transfers. I think he is probably putting far too much pressure on these areas Past medical history is reviewed. He has a history of CHF, severe PAD, hypertension, coronary artery disease status post CABG, COPD Gold stage IV on O2, hypothyroidism and a recent problem with urinary retention he now has a Foley catheter. He has had problems with left leg swelling and adjustment of his diuretics he is followed by Dr. Daneen Schick. The patient has severe PAD. On 06/30/2019 he had an angiogram on the remaining left side he had heavily diseased femoral artery. His superficial femoral artery was flush occluded with runoff in the profunda. He was felt his SFA was likely occluded and reconstitutes the popliteal artery via runoff via the peroneal and posterior tibial artery. As noted he is not in a lot of pain. His last noninvasive study was on 6/20. On the left side this was noncompressible ABI but a TBI of only 0.20 with dampened monophasic waveforms 11/25/2019. Patient has 2 wound areas 1 on the left lateral heel and a ulcer on his coccyx. These are probably all pressure related  although he has significant PAD complicating any attempt to heal a wound on the left heel. We have been using silver collagen on the wound and the coccyx silver alginate on the heel. 11/30; miraculously the left lateral heel wound has healed. The area on his coccyx is not much better. We have been using Prisma He apparently went back to see [name]. His wife paraphrasing says that he had terrible blood flow in the left lower extremity but he is not a candidate for revascularization. Fortunately had enough blood flow to heal the wound I will try to look at Dr. Mee Hives note by the time these next year 12/14; the area on his left lateral heel remains closed ooThe area on his coccyx is about the same in terms of surface area and depth minimal debris however but it is still adherent. We have been using Iodoflex. ooThey stop me before I left the room to show me his right AKA stump. He tells me he is having a lot of pain in this area that is episodic it almost looks like there is bruising in this area but the bruising looks old I asked him about trauma but they were not aware of any. They see vascular tomorrow 12/28; the area on the coccyx looks  absolutely no better. Completely lifeless surface of fibrinous debris and surrounding raised skin. We have been using Iodoflex this is simply not working. ooHe had a fall when he got up forgetting that he was missing his right leg he has skin tears on the right humerus area just above the elbow and on the right forearm. Objective Constitutional Sitting or standing Blood Pressure is within target range for patient.. Pulse regular and within target range for patient.Marland Kitchen Respirations regular, non-labored and within target range.. Temperature is normal and within the target range for the patient.Marland Kitchen Appears in no distress. Vitals Time Taken: 2:27 PM, Height: 69 in, Temperature: 98.4 F, Pulse: 99 bpm, Respiratory Rate: 18 breaths/min, Blood Pressure: 137/75  mmHg. General Notes: Wound exam ooLeft lateral heel remains closed we check this today ooThe coccyx just does not look any better at all. Drying lifeless surface fibrinous debris. I remove this in a #3 curette some of the surrounding thick skin. Hemostasis with direct pressure. ooHe has skin tears on his left upper arm and left forearm. We will use Xeroform to this area Integumentary (Hair, Skin) Wound #15 status is Open. Original cause of wound was Pressure Injury. The wound is located on the Sacrum. The wound measures 0.5cm length x 0.3cm width x 0.2cm depth; 0.118cm^2 area and 0.024cm^3 volume. There is Fat Layer (Subcutaneous Tissue) exposed. There is no tunneling or undermining noted. There is a medium amount of serous drainage noted. The wound margin is epibole. There is small (1-33%) pink, pale granulation within the wound bed. There is a large (67-100%) amount of necrotic tissue within the wound bed including Adherent Slough. Wound #16 status is Open. Original cause of wound was Trauma. The wound is located on the Left,Proximal Upper Arm. The wound measures 10.5cm length x 1cm width x 0.1cm depth; 8.247cm^2 area and 0.825cm^3 volume. There is Fat Layer (Subcutaneous Tissue) exposed. There is no tunneling or undermining noted. There is a medium amount of serosanguineous drainage noted. The wound margin is distinct with the outline attached to the wound base. There is large (67-100%) red granulation within the wound bed. There is no necrotic tissue within the wound bed. Wound #17 status is Open. Original cause of wound was Trauma. The wound is located on the Left,Distal Upper Arm. The wound measures 0.7cm length x 6cm width x 0.1cm depth; 3.299cm^2 area and 0.33cm^3 volume. There is Fat Layer (Subcutaneous Tissue) exposed. There is no tunneling or undermining noted. There is a medium amount of serosanguineous drainage noted. The wound margin is distinct with the outline attached to the wound  base. There is large (67- 100%) red granulation within the wound bed. There is no necrotic tissue within the wound bed. Assessment Active Problems ICD-10 Pressure ulcer of sacral region, stage 3 Acquired absence of right leg above knee Contusion of left upper arm, subsequent encounter Procedures Wound #15 Pre-procedure diagnosis of Wound #15 is a Pressure Ulcer located on the Sacrum . There was a Excisional Skin/Subcutaneous Tissue Debridement with a total area of 0.15 sq cm performed by Ricard Dillon., MD. With the following instrument(s): Curette to remove Viable and Non-Viable tissue/material. Material removed includes Subcutaneous Tissue, Slough, and Skin: Epidermis after achieving pain control using Lidocaine 5% topical ointment. No specimens were taken. A time out was conducted at 15:05, prior to the start of the procedure. A Minimum amount of bleeding was controlled with Pressure. The procedure was tolerated well with a pain level of 0 throughout and a pain level of  0 following the procedure. Post Debridement Measurements: 0.5cm length x 0.3cm width x 0.2cm depth; 0.024cm^3 volume. Post debridement Stage noted as Category/Stage III. Character of Wound/Ulcer Post Debridement requires further debridement. Post procedure Diagnosis Wound #15: Same as Pre-Procedure Plan Follow-up Appointments: Return Appointment in 2 weeks. Bathing/ Shower/ Hygiene: May shower and wash wound with soap and water. - with dressing changes only. Off-Loading: Turn and reposition every 2 hours Home Health: New wound care orders this week; continue Home Health for wound care. May utilize formulary equivalent dressing for wound treatment orders unless otherwise specified. Other Home Health Orders/Instructions: - [facility] WOUND #15: - Sacrum Wound Laterality: Cleanser: Wound Cleanser (Home Health) 1 x Per Day/30 Days Discharge Instructions: Cleanse the wound with wound cleanser prior to applying a clean  dressing using gauze sponges, not tissue or cotton balls. Peri-Wound Care: Skin Prep (Home Health) 1 x Per Day/30 Days Discharge Instructions: Use skin prep as directed Prim Dressing: Santyl Ointment ([place]) 1 x Per Day/30 Days ary Discharge Instructions: Apply nickel thick amount to wound bed as instructed Secondary Dressing: ComfortFoam Border, 3x3 in (silicone border) ([place]) 1 x Per Day/30 Days Discharge Instructions: Apply over primary dressing as directed. WOUND #16: - Upper Arm Wound Laterality: Left, Proximal Cleanser: Normal Saline 2 x Per Week/30 Days Discharge Instructions: Cleanse the wound with Normal Saline prior to applying a clean dressing using gauze sponges, not tissue or cotton balls. Prim Dressing: Xeroform Occlusive Gauze Dressing, 4x4 in (Home Health) 2 x Per Week/30 Days ary Discharge Instructions: Apply to wound bed as instructed Secondary Dressing: Woven Gauze Sponge, Non-Sterile 4x4 in (Home Health) 2 x Per Week/30 Days Discharge Instructions: Apply over primary dressing as directed. Secured With: [employer], 4.5x3.1 (in/yd) (Home Health) 2 x Per Week/30 Days Discharge Instructions: Secure with Kerlix as directed. Secured With: Paper T ape, 2x10 (in/yd) (Home Health) 2 x Per Week/30 Days Discharge Instructions: Secure dressing with tape as directed. WOUND #17: - Upper Arm Wound Laterality: Left, Distal Cleanser: Normal Saline 2 x Per Week/30 Days Discharge Instructions: Cleanse the wound with Normal Saline prior to applying a clean dressing using gauze sponges, not tissue or cotton balls. Prim Dressing: Xeroform Occlusive Gauze Dressing, 4x4 in (Home Health) 2 x Per Week/30 Days ary Discharge Instructions: Apply to wound bed as instructed Secondary Dressing: Woven Gauze Sponge, Non-Sterile 4x4 in (Home Health) 2 x Per Week/30 Days Discharge Instructions: Apply over primary dressing as directed. Secured With: [employer], 4.5x3.1  (in/yd) (Home Health) 2 x Per Week/30 Days Discharge Instructions: Secure with Kerlix as directed. Secured With: Paper T ape, 2x10 (in/yd) (Home Health) 2 x Per Week/30 Days Discharge Instructions: Secure dressing with tape as directed. #1 I switch the primary dressing here to [facility] that she will change daily to the lifeless wound on his coccyx 2. Xeroform to the skin tears x2 on the left arm 3. He does not have any open wound on the heel. 4. Saw the vein and vascular on 12/15. Noted that he does not have any revascularization options. Currently he does not have any wounds on the left foot 5. Follow-up 2-week Electronic Signature(s) Signed: 01/07/2020 12:07:59 PM By: [name] MD Entered By: [name] on 01/06/2020 15:24:20 -------------------------------------------------------------------------------- SuperBill Details Patient Name: Date of Service: DO RN, [name] 01/06/2020 Medical Record Number: [id number] Patient Account Number: [id number] Date of Birth/Sex: Treating RN: [birth year] (86 y.o. [name] Primary Care Provider: [name] Other Clinician: Referring Provider:  Treating Provider/Extender: Quin Hoop in Treatment: 8 Diagnosis Coding ICD-10 Codes Code Description [phone] Pressure ulcer of sacral region, stage 3 Z89.611 Acquired absence of right leg above knee S40.022D Contusion of left upper arm, subsequent encounter Facility Procedures CPT4 Code: [id number] Description: 11042 - DEB SUBQ TISSUE 20 SQ CM/< ICD-10 Diagnosis Description L89.153 Pressure ulcer of sacral region, stage 3 Modifier: Quantity: 1 Physician Procedures : CPT4 Code Description Modifier [id number] 11042 - WC PHYS SUBQ TISS 20 SQ CM ICD-10 Diagnosis Description L89.153 Pressure ulcer of sacral region, stage 3 Quantity: 1 Electronic Signature(s) Signed: 01/07/2020 12:07:59 PM By: Baltazar Najjar MD Entered By: Baltazar Najjar on 01/06/2020  15:24:33

## 2020-01-07 NOTE — Progress Notes (Signed)
[name], [name] ([id number]) Visit Report for 01/06/2020 Arrival Information Details Patient Name: Date of Service: DO RN, [place] V ID L. 01/06/2020 2:00 PM Medical Record Number: [id number] Patient Account Number: [id number] Date of Birth/Sex: Treating RN: 08-12-1929 (86 y.o. [name] Primary [name] [name]: [name] Other Clinician: Referring [name]: Treating [name] in [name]: 8 Visit Information History Since Last Visit Added or deleted any medications: No Patient Arrived: Wheel Chair Any new allergies or adverse reactions: No Arrival Time: 14:26 Had a fall or experienced change in Yes Accompanied By: wife activities of daily living that may affect Transfer Assistance: None risk of falls: Patient Identification Verified: Yes Signs or symptoms of abuse/neglect since last visito No Secondary Verification Process Completed: Yes Hospitalized since last visit: No Patient Requires Transmission-Based Precautions: No Implantable device outside of the clinic excluding No Patient Has Alerts: Yes cellular tissue based products placed in the center Patient Alerts: L ABI N/C, TBI = .20 since last visit: Has Dressing in Place as Prescribed: Yes Pain Present Now: Yes Electronic Signature(s) Signed: 01/06/2020 6:26:47 PM By: [name] Entered By: [name] on 01/06/2020 14:41:33 -------------------------------------------------------------------------------- Encounter Discharge Information Details Patient Name: Date of Service: DO RN, Travis V ID L. 01/06/2020 2:00 PM Medical Record Number: [id number] Patient Account Number: [id number] Date of Birth/Sex: Treating RN: 17-Jul-1929 (86 y.o. [name] Primary [name] Jamileth Putzier: [name] Other Clinician: Referring [name]: Treating [name]/Extender: [name]: 8 Encounter Discharge Information Items Post Procedure  Vitals Discharge Condition: Stable Temperature (F): 98.4 Ambulatory Status: Wheelchair Pulse (bpm): 99 Discharge Destination: Home Respiratory Rate (breaths/min): 18 Transportation: Private Auto Blood Pressure (mmHg): 137/75 Accompanied By: wife Schedule Follow-up Appointment: Yes Clinical Summary of [name]: Electronic Signature(s) Signed: 01/06/2020 6:26:47 PM By: [name] Entered By: [name] on 01/06/2020 18:17:43 -------------------------------------------------------------------------------- Lower Extremity Assessment Details Patient Name: Date of Service: DO RN, Travis V ID L. 01/06/2020 2:00 PM Medical Record Number: [id number] Patient Account Number: [id number] Date of Birth/Sex: Treating RN: 04/14/1929 (86 y.o. [name] Primary [name] Kaulana Brindle: [name] Other Clinician: Referring [name]: Treating [name]/Extender: [name]: 8 Electronic Signature(s) Signed: 01/06/2020 6:26:47 PM By: [name] Entered By: [name] on 01/06/2020 14:41:26 -------------------------------------------------------------------------------- Multi Wound Chart Details Patient Name: Date of Service: DO RN, Travis V ID L. 01/06/2020 2:00 PM Medical Record Number: [id number] Patient Account Number: [id number] Date of Birth/Sex: Treating RN: 26-Feb-1929 (86 y.o. [name] Primary [name] Efraim Vanallen: [name] Other Clinician: Referring [name]: Treating [name]/Extender: [name]: 8 Vital Signs Height(in): 69 Pulse(bpm): 99 Weight(lbs): Blood Pressure(mmHg): 137/75 Body Mass Index(BMI): Temperature(F): 98.4 Respiratory Rate(breaths/min): 18 Photos: [15:No Photos Sacrum] [16:No Photos Left, Proximal Upper Arm] [17:No Photos Left, Distal Upper Arm] Wound Location: [15:Pressure Injury] [16:Trauma] [17:Trauma] Wounding Event: [15:Pressure Ulcer] [16:Trauma, Other] [17:Trauma,  Other] Primary Etiology: [15:Cataracts, Asthma, Chronic] [16:Cataracts, Asthma, Chronic] [17:Cataracts, Asthma, Chronic] Comorbid History: [15:Obstructive Pulmonary Disease (COPD), Congestive Heart Failure, Coronary Artery Disease, Hypertension, Peripheral Arterial Disease, Osteoarthritis, Confinement Disease, Osteoarthritis, Confinement Disease, Osteoarthritis,  Confinement Anxiety 08/01/2019] [16:Obstructive Pulmonary Disease (COPD), Congestive Heart Failure, Coronary Artery Disease, Hypertension, Peripheral Arterial Anxiety 01/04/2020] [17:Obstructive Pulmonary Disease (COPD), Congestive Heart Failure, Coronary  Artery Disease, Hypertension, Peripheral Arterial Anxiety 01/04/2020] Date Acquired: [15:8] [16:0] [17:0] Weeks of [name]: [15:Open] [16:Open] [17:Open] Wound Status: [15:0.5x0.3x0.2] [16:10.5x1x0.1] [17:0.7x6x0.1] Measurements L x W x D (cm) [15:0.118] [16:8.247] [17:3.299] A (cm) : rea [15:0.024] [16:0.825] [17:0.33] Volume (cm) : [  15:46.40%] [16:N/A] [17:N/A] % Reduction in Area: [15:-9.10%] [16:N/A] [17:N/A] % Reduction in Volume: [15:Category/Stage III] [16:Full Thickness Without Exposed] [17:Full Thickness Without Exposed] Classification: [15:Medium] [16:Support Structures Medium] [17:Support Structures Medium] Exudate Amount: [15:Serous] [16:Serosanguineous] [17:Serosanguineous] Exudate Type: [15:amber] [16:red, brown] [17:red, brown] Exudate Color: [15:Epibole] [16:Distinct, outline attached] [17:Distinct, outline attached] Wound Margin: [15:Small (1-33%)] [16:Large (67-100%)] [17:Large (67-100%)] Granulation Amount: [15:Pink, Pale] [16:Red] [17:Red] Granulation Quality: [15:Large (67-100%)] [16:None Present (0%)] [17:None Present (0%)] Necrotic Amount: [15:Fat Layer (Subcutaneous Tissue): Yes Fat Layer (Subcutaneous Tissue): Yes Fat Layer (Subcutaneous Tissue): Yes] Exposed Structures: [15:Fascia: No Tendon: No Muscle: No Joint: No Bone: No Small (1-33%)] [16:Fascia:  No Tendon: No Muscle: No Joint: No Bone: No None] [17:Fascia: No Tendon: No Muscle: No Joint: No Bone: No None] Epithelialization: [15:Debridement - Excisional] [16:N/A] [17:N/A] Debridement: [15:15:05] [16:N/A] [17:N/A] Pre-procedure Verification/Time Out Taken: [15:Lidocaine 5% topical ointment] [16:N/A] [17:N/A] Pain Control: [15:Subcutaneous, Slough] [16:N/A] [17:N/A] Tissue Debrided: [15:Skin/Subcutaneous Tissue] [16:N/A] [17:N/A] Level: [15:0.15] [16:N/A] [17:N/A] Debridement A (sq cm): [15:rea Curette] [16:N/A] [17:N/A] Instrument: [15:Minimum] [16:N/A] [17:N/A] Bleeding: [15:Pressure] [16:N/A] [17:N/A] Hemostasis A chieved: [15:0] [16:N/A] [17:N/A] Procedural Pain: [15:0] [16:N/A] [17:N/A] Post Procedural Pain: [15:Procedure was tolerated well] [16:N/A] [17:N/A] Debridement [name] Response: [15:0.5x0.3x0.2] [16:N/A] [17:N/A] Post Debridement Measurements L x W x D (cm) [15:0.024] [16:N/A] [17:N/A] Post Debridement Volume: (cm) [15:Category/Stage III] [16:N/A] [17:N/A] Post Debridement Stage: [15:Debridement] [16:N/A] [17:N/A] [name] Notes Electronic Signature(s) Signed: 01/06/2020 6:35:32 PM By: Baruch Gouty RN, BSN Signed: 01/07/2020 12:07:59 PM By: [name] MD Entered By: [name] on 01/06/2020 15:18:47 -------------------------------------------------------------------------------- Multi-Disciplinary [name] Plan Details Patient Name: Date of Service: DO RN, Travis V ID L. 01/06/2020 2:00 PM Medical Record Number: [id number] Patient Account Number: [id number] Date of Birth/Sex: Treating RN: [birth year] (86 y.o. [name] Primary [name] Brendy Ficek: [name] Other Clinician: Referring Ira Busbin: Treating Royalti Schauf/Extender: [name] in [name]: 8 Active Inactive Wound/Skin Impairment Nursing Diagnoses: Knowledge deficit related to ulceration/compromised skin integrity Goals: Patient/caregiver will verbalize  understanding of skin [name] regimen Date Initiated: 11/11/2019 Target Resolution Date: 01/10/2020 Goal Status: Active Ulcer/skin breakdown will have a volume reduction of 30% by week 4 Date Initiated: 11/11/2019 Target Resolution Date: 01/10/2020 Goal Status: Active Interventions: Assess patient/caregiver ability to obtain necessary supplies Assess patient/caregiver ability to perform ulcer/skin [name] regimen upon admission and as needed Assess ulceration(s) every visit Notes: Electronic Signature(s) Signed: 01/06/2020 6:35:32 PM By: Baruch Gouty RN, BSN Entered By: Baruch Gouty on 01/06/2020 15:09:51 -------------------------------------------------------------------------------- Pain Assessment Details Patient Name: Date of Service: DO RN, Travis V ID L. 01/06/2020 2:00 PM Medical Record Number: [id number] Patient Account Number: [id number] Date of Birth/Sex: Treating RN: February 19, 1929 (86 y.o. [name] Primary [name] Joaquim Tolen: [name] Other Clinician: Referring Shatiqua Heroux: Treating Shatia Sindoni/Extender: [name] in [name]: 8 Active Problems Location of Pain Severity and Description of Pain Patient Has Paino Yes Site Locations Rate the pain. Current Pain Level: 4 Pain Management and Medication Current Pain Management: Electronic Signature(s) Signed: 01/06/2020 6:35:32 PM By: Baruch Gouty RN, BSN Signed: 01/07/2020 10:14:31 AM By: [name] Entered By: [name] on 01/06/2020 14:27:35 -------------------------------------------------------------------------------- Patient/Caregiver Education Details Patient Name: Date of Service: DO RN, Travis V ID L. 12/28/2021andnbsp2:00 PM Medical Record Number: [id number] Patient Account Number: [id number] Date of Birth/Gender: Treating RN: 09-24-1929 (86 y.o. [name] Primary [name] Physician: [name] Other Clinician: Referring Physician: Treating Physician/Extender:  [name] in [name]: 8 Education Assessment Education Provided To: Patient Education Topics Provided Pressure: Methods: Explain/Verbal Responses: Reinforcements  needed, State content correctly Safety: Methods: Explain/Verbal Responses: Reinforcements needed, State content correctly Wound/Skin Impairment: Methods: Explain/Verbal Responses: Reinforcements needed, State content correctly Electronic Signature(s) Signed: 01/06/2020 6:35:32 PM By: Baruch Gouty RN, BSN Entered By: Baruch Gouty on 01/06/2020 15:10:30 -------------------------------------------------------------------------------- Wound Assessment Details Patient Name: Date of Service: DO RN, Travis V ID L. 01/06/2020 2:00 PM Medical Record Number: [id number] Patient Account Number: [id number] Date of Birth/Sex: Treating RN: August 01, 1929 (86 y.o. [name] Primary [name] Jessicah Croll: [name] Other Clinician: Referring Zaidin Blyden: Treating Raye Slyter/Extender: [name] in [name]: 8 Wound Status Wound Number: 15 Primary Pressure Ulcer Etiology: Wound Location: Sacrum Wound Open Wounding Event: Pressure Injury Status: Date Acquired: 08/01/2019 Comorbid Cataracts, Asthma, Chronic Obstructive Pulmonary Disease Weeks Of [name]: 8 History: (COPD), Congestive Heart Failure, Coronary Artery Disease, Clustered Wound: No Hypertension, Peripheral Arterial Disease, Osteoarthritis, Confinement Anxiety Wound Measurements Length: (cm) 0.5 Width: (cm) 0.3 Depth: (cm) 0.2 Area: (cm) 0.118 Volume: (cm) 0.024 % Reduction in Area: 46.4% % Reduction in Volume: -9.1% Epithelialization: Small (1-33%) Tunneling: No Undermining: No Wound Description Classification: Category/Stage III Wound Margin: Epibole Exudate Amount: Medium Exudate Type: Serous Exudate Color: amber Foul Odor After Cleansing: No Slough/Fibrino Yes Wound Bed Granulation Amount: Small  (1-33%) Exposed Structure Granulation Quality: Pink, Pale Fascia Exposed: No Necrotic Amount: Large (67-100%) Fat Layer (Subcutaneous Tissue) Exposed: Yes Necrotic Quality: Adherent Slough Tendon Exposed: No Muscle Exposed: No Joint Exposed: No Bone Exposed: No [name] Notes Wound #15 (Sacrum) Cleanser Wound Cleanser Discharge Instruction: Cleanse the wound with wound cleanser prior to applying a clean dressing using gauze sponges, not tissue or cotton balls. Peri-Wound [name] Skin Prep Discharge Instruction: Use skin prep as directed Topical Primary Dressing Santyl Ointment Discharge Instruction: Apply nickel thick amount to wound bed as instructed Secondary Dressing ComfortFoam Border, 3x3 in (silicone border) Discharge Instruction: Apply over primary dressing as directed. Secured With Compression Wrap Compression Stockings Environmental education officer) Signed: 01/06/2020 6:26:47 PM By: [name] Entered By: [name] on 01/06/2020 14:41:17 -------------------------------------------------------------------------------- Wound Assessment Details Patient Name: Date of Service: DO RN, Travis V ID L. 01/06/2020 2:00 PM Medical Record Number: [id number] Patient Account Number: [id number] Date of Birth/Sex: Treating RN: October 08, 1929 (86 y.o. [name] Primary [name] Caridad Silveira: [name] Other Clinician: Referring Khale Nigh: Treating Chezney Huether/Extender: [name] in [name]: 8 Wound Status Wound Number: 16 Primary Trauma, Other Etiology: Wound Location: Left, Proximal Upper Arm Wound Open Wounding Event: Trauma Status: Date Acquired: 01/04/2020 Comorbid Cataracts, Asthma, Chronic Obstructive Pulmonary Disease Weeks Of [name]: 0 History: (COPD), Congestive Heart Failure, Coronary Artery Disease, Clustered Wound: No Hypertension, Peripheral Arterial Disease, Osteoarthritis, Confinement Anxiety Wound Measurements Length: (cm)  10.5 Width: (cm) 1 Depth: (cm) 0.1 Area: (cm) 8.247 Volume: (cm) 0.825 % Reduction in Area: % Reduction in Volume: Epithelialization: None Tunneling: No Undermining: No Wound Description Classification: Full Thickness Without Exposed Support Structures Wound Margin: Distinct, outline attached Exudate Amount: Medium Exudate Type: Serosanguineous Exudate Color: red, brown Foul Odor After Cleansing: No Slough/Fibrino No Wound Bed Granulation Amount: Large (67-100%) Exposed Structure Granulation Quality: Red Fascia Exposed: No Necrotic Amount: None Present (0%) Fat Layer (Subcutaneous Tissue) Exposed: Yes Tendon Exposed: No Muscle Exposed: No Joint Exposed: No Bone Exposed: No [name] Notes Wound #16 (Upper Arm) Wound Laterality: Left, Proximal Cleanser Normal Saline Discharge Instruction: Cleanse the wound with Normal Saline prior to applying a clean dressing using gauze sponges, not tissue or cotton balls. Peri-Wound [name] Topical Primary Dressing Xeroform Occlusive Gauze Dressing, 4x4 in Discharge  Instruction: Apply to wound bed as instructed Secondary Dressing Woven Gauze Sponge, Non-Sterile 4x4 in Discharge Instruction: Apply over primary dressing as directed. Secured With [employer], 4.5x3.1 (in/yd) Discharge Instruction: Secure with Kerlix as directed. Paper Tape, 2x10 (in/yd) Discharge Instruction: Secure dressing with tape as directed. Compression Wrap Compression Stockings Add-Ons Electronic Signature(s) Signed: 01/06/2020 6:26:47 PM By: [name] Entered By: [name] on 01/06/2020 14:39:57 -------------------------------------------------------------------------------- Wound Assessment Details Patient Name: Date of Service: DO RN, Travis V ID L. 01/06/2020 2:00 PM Medical Record Number: [id number] Patient Account Number: [id number] Date of Birth/Sex: Treating RN: [birth year] (86 y.o. [name] Primary [name] Nikala Walsworth: [name] Other Clinician: Referring Braelynne Garinger: Treating Johnie Stadel/Extender: [name] in [name]: 8 Wound Status Wound Number: 17 Primary Trauma, Other Etiology: Wound Location: Left, Distal Upper Arm Wound Open Wounding Event: Trauma Status: Date Acquired: 01/04/2020 Comorbid Cataracts, Asthma, Chronic Obstructive Pulmonary Disease Weeks Of [name]: 0 History: (COPD), Congestive Heart Failure, Coronary Artery Disease, Clustered Wound: No Hypertension, Peripheral Arterial Disease, Osteoarthritis, Confinement Anxiety Wound Measurements Length: (cm) 0.7 Width: (cm) 6 Depth: (cm) 0.1 Area: (cm) 3.299 Volume: (cm) 0.33 % Reduction in Area: % Reduction in Volume: Epithelialization: None Tunneling: No Undermining: No Wound Description Classification: Full Thickness Without Exposed Support Structures Wound Margin: Distinct, outline attached Exudate Amount: Medium Exudate Type: Serosanguineous Exudate Color: red, brown Foul Odor After Cleansing: No Slough/Fibrino No Wound Bed Granulation Amount: Large (67-100%) Exposed Structure Granulation Quality: Red Fascia Exposed: No Necrotic Amount: None Present (0%) Fat Layer (Subcutaneous Tissue) Exposed: Yes Tendon Exposed: No Muscle Exposed: No Joint Exposed: No Bone Exposed: No [name] Notes Wound #17 (Upper Arm) Wound Laterality: Left, Distal Cleanser Normal Saline Discharge Instruction: Cleanse the wound with Normal Saline prior to applying a clean dressing using gauze sponges, not tissue or cotton balls. Peri-Wound [name] Topical Primary Dressing Xeroform Occlusive Gauze Dressing, 4x4 in Discharge Instruction: Apply to wound bed as instructed Secondary Dressing Woven Gauze Sponge, Non-Sterile 4x4 in Discharge Instruction: Apply over primary dressing as directed. Secured With [employer], 4.5x3.1 (in/yd) Discharge Instruction: Secure with Kerlix as directed. Paper Tape, 2x10  (in/yd) Discharge Instruction: Secure dressing with tape as directed. Compression Wrap Compression Stockings Add-Ons Electronic Signature(s) Signed: 01/06/2020 6:26:47 PM By: [name] Entered By: [name] on 01/06/2020 14:40:50 -------------------------------------------------------------------------------- Vitals Details Patient Name: Date of Service: DO RN, Travis V ID L. 01/06/2020 2:00 PM Medical Record Number: [id number] Patient Account Number: [id number] Date of Birth/Sex: Treating RN: 16-Oct-1929 (86 y.o. [name] Primary [name] Raeleen Winstanley: [name] Other Clinician: Referring Zakk Borgen: Treating Katy Brickell/Extender: [name] in [name]: 8 Vital Signs Time Taken: 14:27 Temperature (F): 98.4 Height (in): 69 Pulse (bpm): 99 Respiratory Rate (breaths/min): 18 Blood Pressure (mmHg): 137/75 Reference Range: 80 - 120 mg / dl Electronic Signature(s) Signed: 01/07/2020 10:14:31 AM By: [name] Entered By: [name] on 01/06/2020 14:27:25

## 2020-01-08 DIAGNOSIS — I5032 Chronic diastolic (congestive) heart failure: Secondary | ICD-10-CM | POA: Diagnosis not present

## 2020-01-08 DIAGNOSIS — I480 Paroxysmal atrial fibrillation: Secondary | ICD-10-CM | POA: Diagnosis not present

## 2020-01-08 DIAGNOSIS — L89152 Pressure ulcer of sacral region, stage 2: Secondary | ICD-10-CM | POA: Diagnosis not present

## 2020-01-08 DIAGNOSIS — I739 Peripheral vascular disease, unspecified: Secondary | ICD-10-CM | POA: Diagnosis not present

## 2020-01-08 DIAGNOSIS — J9611 Chronic respiratory failure with hypoxia: Secondary | ICD-10-CM | POA: Diagnosis not present

## 2020-01-08 DIAGNOSIS — I110 Hypertensive heart disease with heart failure: Secondary | ICD-10-CM | POA: Diagnosis not present

## 2020-01-09 DIAGNOSIS — I2510 Atherosclerotic heart disease of native coronary artery without angina pectoris: Secondary | ICD-10-CM | POA: Diagnosis not present

## 2020-01-09 DIAGNOSIS — E782 Mixed hyperlipidemia: Secondary | ICD-10-CM | POA: Diagnosis not present

## 2020-01-09 DIAGNOSIS — D509 Iron deficiency anemia, unspecified: Secondary | ICD-10-CM | POA: Diagnosis not present

## 2020-01-09 DIAGNOSIS — I25810 Atherosclerosis of coronary artery bypass graft(s) without angina pectoris: Secondary | ICD-10-CM | POA: Diagnosis not present

## 2020-01-09 DIAGNOSIS — N400 Enlarged prostate without lower urinary tract symptoms: Secondary | ICD-10-CM | POA: Diagnosis not present

## 2020-01-09 DIAGNOSIS — I509 Heart failure, unspecified: Secondary | ICD-10-CM | POA: Diagnosis not present

## 2020-01-09 DIAGNOSIS — E039 Hypothyroidism, unspecified: Secondary | ICD-10-CM | POA: Diagnosis not present

## 2020-01-09 DIAGNOSIS — I10 Essential (primary) hypertension: Secondary | ICD-10-CM | POA: Diagnosis not present

## 2020-01-09 DIAGNOSIS — I110 Hypertensive heart disease with heart failure: Secondary | ICD-10-CM | POA: Diagnosis not present

## 2020-01-09 DIAGNOSIS — F324 Major depressive disorder, single episode, in partial remission: Secondary | ICD-10-CM | POA: Diagnosis not present

## 2020-01-09 DIAGNOSIS — I5032 Chronic diastolic (congestive) heart failure: Secondary | ICD-10-CM | POA: Diagnosis not present

## 2020-01-09 DIAGNOSIS — J449 Chronic obstructive pulmonary disease, unspecified: Secondary | ICD-10-CM | POA: Diagnosis not present

## 2020-01-14 DIAGNOSIS — L89152 Pressure ulcer of sacral region, stage 2: Secondary | ICD-10-CM | POA: Diagnosis not present

## 2020-01-14 DIAGNOSIS — I739 Peripheral vascular disease, unspecified: Secondary | ICD-10-CM | POA: Diagnosis not present

## 2020-01-14 DIAGNOSIS — J9611 Chronic respiratory failure with hypoxia: Secondary | ICD-10-CM | POA: Diagnosis not present

## 2020-01-14 DIAGNOSIS — I5032 Chronic diastolic (congestive) heart failure: Secondary | ICD-10-CM | POA: Diagnosis not present

## 2020-01-14 DIAGNOSIS — I480 Paroxysmal atrial fibrillation: Secondary | ICD-10-CM | POA: Diagnosis not present

## 2020-01-14 DIAGNOSIS — I110 Hypertensive heart disease with heart failure: Secondary | ICD-10-CM | POA: Diagnosis not present

## 2020-01-15 DIAGNOSIS — D649 Anemia, unspecified: Secondary | ICD-10-CM | POA: Diagnosis not present

## 2020-01-15 DIAGNOSIS — R899 Unspecified abnormal finding in specimens from other organs, systems and tissues: Secondary | ICD-10-CM | POA: Diagnosis not present

## 2020-01-15 DIAGNOSIS — E039 Hypothyroidism, unspecified: Secondary | ICD-10-CM | POA: Diagnosis not present

## 2020-01-16 ENCOUNTER — Encounter: Payer: Self-pay | Admitting: *Deleted

## 2020-01-16 ENCOUNTER — Other Ambulatory Visit: Payer: Self-pay | Admitting: *Deleted

## 2020-01-16 DIAGNOSIS — R3914 Feeling of incomplete bladder emptying: Secondary | ICD-10-CM | POA: Diagnosis not present

## 2020-01-16 NOTE — Patient Outreach (Signed)
[facility]) Care Management  01/16/2020  [name] 1929/11/15 [id number]  CSW was able to make contact with patient today to follow-up regarding social work services and resources, but only briefly, before he handed the phone to his wife, [name], requesting that she converse directly with CSW.  CSW inquired as to whether or not patient is more receptive to receiving free telephonic counseling services through CSW, at least until he is well established with his new Psychologist, Dr. Ilean Skill with [facility].  [name] denied, indicating that patient has decided to wait until his appointment with Dr. Sima Matas, scheduled for Thursday, March 25, 2020, at 1:00PM.    CSW voiced understanding and was agreeable to this plan, confirming that the [facility] have the correct contact information for CSW, encouraging them to contact CSW directly if they change their minds about wanting to receive counseling services through [facility], or if additional social work needs arise in the near future.  CSW will perform a case closure on patient, as patient is declining social work involvement for counseling services, and no additional social work needs have been identified at this time.  CSW will notify patient's RNCM with [facility], [name] of CSW's plans to close patient's case.  CSW will fax an update to patient's Primary Care Physician, [name] to ensure that he is aware of CSW's attempts at involvement with patient's plan of care, in addition to sending a Physician Case Closure Letter.    [name], BSW, MSW, LCSW  Licensed Education officer, [facility]  Mailing [address] [address] [place], [place] [address] Physical Address-[address] [place], [place] [address] Toll Free Main # [phone] Fax # [phone] Cell # [phone]   [name]

## 2020-01-19 ENCOUNTER — Encounter: Payer: Self-pay | Admitting: Physical Medicine & Rehabilitation

## 2020-01-19 ENCOUNTER — Emergency Department (HOSPITAL_COMMUNITY)
Admission: EM | Admit: 2020-01-19 | Discharge: 2020-01-20 | Disposition: A | Discharge status: Home / Self Care | Payer: Medicare Other | Attending: Emergency Medicine | Admitting: Emergency Medicine

## 2020-01-19 ENCOUNTER — Other Ambulatory Visit: Payer: Self-pay

## 2020-01-19 ENCOUNTER — Encounter (HOSPITAL_COMMUNITY): Payer: Self-pay

## 2020-01-19 ENCOUNTER — Encounter: Payer: Medicare Other | Attending: Physical Medicine & Rehabilitation | Admitting: Physical Medicine & Rehabilitation

## 2020-01-19 ENCOUNTER — Telehealth: Payer: Self-pay | Admitting: *Deleted

## 2020-01-19 VITALS — Ht 68.0 in

## 2020-01-19 DIAGNOSIS — J45909 Unspecified asthma, uncomplicated: Secondary | ICD-10-CM | POA: Insufficient documentation

## 2020-01-19 DIAGNOSIS — R269 Unspecified abnormalities of gait and mobility: Secondary | ICD-10-CM

## 2020-01-19 DIAGNOSIS — Z89611 Acquired absence of right leg above knee: Secondary | ICD-10-CM | POA: Insufficient documentation

## 2020-01-19 DIAGNOSIS — L97521 Non-pressure chronic ulcer of other part of left foot limited to breakdown of skin: Secondary | ICD-10-CM | POA: Diagnosis not present

## 2020-01-19 DIAGNOSIS — J449 Chronic obstructive pulmonary disease, unspecified: Secondary | ICD-10-CM | POA: Insufficient documentation

## 2020-01-19 DIAGNOSIS — Z85828 Personal history of other malignant neoplasm of skin: Secondary | ICD-10-CM | POA: Insufficient documentation

## 2020-01-19 DIAGNOSIS — Z7982 Long term (current) use of aspirin: Secondary | ICD-10-CM | POA: Diagnosis not present

## 2020-01-19 DIAGNOSIS — S91302A Unspecified open wound, left foot, initial encounter: Secondary | ICD-10-CM | POA: Diagnosis not present

## 2020-01-19 DIAGNOSIS — Z7951 Long term (current) use of inhaled steroids: Secondary | ICD-10-CM | POA: Diagnosis not present

## 2020-01-19 DIAGNOSIS — Z89612 Acquired absence of left leg above knee: Secondary | ICD-10-CM | POA: Insufficient documentation

## 2020-01-19 DIAGNOSIS — Z9981 Dependence on supplemental oxygen: Secondary | ICD-10-CM | POA: Insufficient documentation

## 2020-01-19 DIAGNOSIS — I5023 Acute on chronic systolic (congestive) heart failure: Secondary | ICD-10-CM | POA: Diagnosis not present

## 2020-01-19 DIAGNOSIS — R39198 Other difficulties with micturition: Secondary | ICD-10-CM | POA: Insufficient documentation

## 2020-01-19 DIAGNOSIS — I110 Hypertensive heart disease with heart failure: Secondary | ICD-10-CM | POA: Insufficient documentation

## 2020-01-19 DIAGNOSIS — F32A Depression, unspecified: Secondary | ICD-10-CM | POA: Diagnosis not present

## 2020-01-19 DIAGNOSIS — Z87891 Personal history of nicotine dependence: Secondary | ICD-10-CM | POA: Diagnosis not present

## 2020-01-19 DIAGNOSIS — E039 Hypothyroidism, unspecified: Secondary | ICD-10-CM | POA: Diagnosis not present

## 2020-01-19 DIAGNOSIS — Z951 Presence of aortocoronary bypass graft: Secondary | ICD-10-CM | POA: Diagnosis not present

## 2020-01-19 DIAGNOSIS — Z79899 Other long term (current) drug therapy: Secondary | ICD-10-CM | POA: Insufficient documentation

## 2020-01-19 DIAGNOSIS — R238 Other skin changes: Secondary | ICD-10-CM

## 2020-01-19 LAB — CBC WITH DIFFERENTIAL/PLATELET
Abs Immature Granulocytes: 0.26 10*3/uL — ABNORMAL HIGH (ref 0.00–0.07)
Basophils Absolute: 0 10*3/uL (ref 0.0–0.1)
Basophils Relative: 0 %
Eosinophils Absolute: 0 10*3/uL (ref 0.0–0.5)
Eosinophils Relative: 0 %
HCT: 37.6 % — ABNORMAL LOW (ref 39.0–52.0)
Hemoglobin: 12.5 g/dL — ABNORMAL LOW (ref 13.0–17.0)
Immature Granulocytes: 2 %
Lymphocytes Relative: 3 %
Lymphs Abs: 0.4 10*3/uL — ABNORMAL LOW (ref 0.7–4.0)
MCH: 32.3 pg (ref 26.0–34.0)
MCHC: 33.2 g/dL (ref 30.0–36.0)
MCV: 97.2 fL (ref 80.0–100.0)
Monocytes Absolute: 0.4 10*3/uL (ref 0.1–1.0)
Monocytes Relative: 3 %
Neutro Abs: 13.4 10*3/uL — ABNORMAL HIGH (ref 1.7–7.7)
Neutrophils Relative %: 92 %
Platelets: 305 10*3/uL (ref 150–400)
RBC: 3.87 MIL/uL — ABNORMAL LOW (ref 4.22–5.81)
RDW: 13.2 % (ref 11.5–15.5)
WBC: 14.5 10*3/uL — ABNORMAL HIGH (ref 4.0–10.5)
nRBC: 0 % (ref 0.0–0.2)

## 2020-01-19 LAB — COMPREHENSIVE METABOLIC PANEL
ALT: 60 U/L — ABNORMAL HIGH (ref 0–44)
AST: 37 U/L (ref 15–41)
Albumin: 4.3 g/dL (ref 3.5–5.0)
Alkaline Phosphatase: 60 U/L (ref 38–126)
Anion gap: 10 (ref 5–15)
BUN: 33 mg/dL — ABNORMAL HIGH (ref 8–23)
CO2: 32 mmol/L (ref 22–32)
Calcium: 9.6 mg/dL (ref 8.9–10.3)
Chloride: 93 mmol/L — ABNORMAL LOW (ref 98–111)
Creatinine, Ser: 1.35 mg/dL — ABNORMAL HIGH (ref 0.61–1.24)
GFR, Estimated: 50 mL/min — ABNORMAL LOW (ref >60)
Glucose, Bld: 133 mg/dL — ABNORMAL HIGH (ref 70–99)
Potassium: 5 mmol/L (ref 3.5–5.1)
Sodium: 135 mmol/L (ref 135–145)
Total Bilirubin: 0.8 mg/dL (ref 0.3–1.2)
Total Protein: 6.5 g/dL (ref 6.5–8.1)

## 2020-01-19 LAB — LACTIC ACID, PLASMA: Lactic Acid, Venous: 1.7 mmol/L (ref 0.5–1.9)

## 2020-01-19 MED ORDER — CEPHALEXIN 500 MG PO CAPS: 500.0000 mg | ORAL_CAPSULE | Freq: Four times a day (QID) | ORAL | 0 refills | Status: DC

## 2020-01-19 NOTE — ED Notes (Signed)
Pt's oxygen tank was changed out to a fresh tank by Elwin Mocha, RN.

## 2020-01-19 NOTE — Telephone Encounter (Signed)
[name] is allergic to cephlasporins.  They prefer if it will work--amoxicillin or amoxiclave (?).   Please order something else.

## 2020-01-19 NOTE — ED Triage Notes (Signed)
Patient was referred from the [facility]. Patient's wife reports new pressure ulcers that are draining yellow thick drainage.

## 2020-01-19 NOTE — Progress Notes (Signed)
Subjective:    Patient ID: [name], male    DOB: December 22, 1929, 87 y.o.   MRN: [id number]  TELEHEALTH NOTE  Due to national recommendations of social distancing due to COVID 19, an audio/video telehealth visit is felt to be most appropriate for this patient at this time.  See Chart message from today for the patient's consent to telehealth from [place].     I verified that I am speaking with the correct person using two identifiers.  Location of patient: Home Location of provider: Office Method of communication: MyChart video and telephone Names of participants : Zorita Pang scheduling, [place] Scrape obtaining consent and vitals if available Established patient  HPI Male with history of COPD with chronic hypoxic respiratory failure and oxygen dependent, BPH--Foley in place for retention, PAF, CAD, PVD presents for follow up for right AKA with multiple medical problems. He is no longer taking Gabapentin.  He continues to follow up with Urology and maintained on foley. He sees Neuropsych next month. Orthostatics has improved.  Pain Inventory Average Pain 5 Pain Right Now 6 My pain is intermittent, sharp, burning, dull, stabbing, tingling and aching  In the last 24 hours, has pain interfered with the following? General activity 4 Relation with others 4 Enjoyment of life 6 What TIME of day is your pain at its worst? varies Sleep (in general) Fair  Pain is worse with: sitting, some activites and movement Pain improves with: medication and Tylenol Relief from Meds: 4   Family History  Problem Relation Age of Onset  . Heart disease Mother   . Heart disease Father        Before age 16  . Breast cancer Mother   . Cancer Mother        Breast cancer  . Hypertension Mother   . Other Mother        AAA  and   Amputation  . Heart attack Father   . Stroke Brother        x3, still living   . Peripheral vascular disease Brother   . Heart  disease Brother   . Hyperlipidemia Brother   . Hypertension Brother   . Heart disease Brother   . Allergies Brother    Social History   Socioeconomic History  . Marital status: Married    Spouse name: Michelle Vanhise  . Number of children: 2  . Years of education: 24  . Highest education level: 12th grade  Occupational History  . 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 Smoking status: Former Smoker    Packs/day: 2.00    Years: 33.00    Pack years: 66.00    Types: Cigarettes    Quit date: 03/25/1982    Years since quitting: 37.8  . Smokeless tobacco: Never Used  Vaping Use  . Vaping Use: Never used  Substance and Sexual Activity  . Alcohol use: Yes    Alcohol/week: 0.0 standard drinks    Comment: 1.5-3 oz daily in the past, minimal use in the last few months  . Drug use: No  . Sexual activity: Not Currently  Other Topics Concern  . Not on file  Social History Narrative   ** Merged History Encounter **       Pt lives at home with his spouse.         Social Determinants of Health   Financial Resource Strain: Low Risk   .  Difficulty of Paying Living Expenses: Not hard at all  Food Insecurity: No Food Insecurity  . Worried About Charity fundraiser in the Last Year: Never true  . Ran Out of Food in the Last Year: Never true  Transportation Needs: No Transportation Needs  . Lack of Transportation (Medical): No  . Lack of Transportation (Non-Medical): No  Physical Activity: Inactive  . Days of Exercise per Week: 0 days  . Minutes of Exercise per Session: 0 min  Stress: No Stress Concern Present  . Feeling of Stress : Only a little  Social Connections: Moderately Integrated  . Frequency of Communication with Friends and Family: More than three times a week  . Frequency of Social Gatherings with Friends and Family: More than three times a week  . Attends Religious Services: More than 4 times per year  . Active Member of  Clubs or Organizations: No  . Attends Archivist Meetings: Never  . Marital Status: Married   Past Surgical History:  Procedure Laterality Date  . ABDOMINAL AORTOGRAM W/LOWER EXTREMITY Bilateral 06/30/2019   Procedure: ABDOMINAL AORTOGRAM W/LOWER EXTREMITY;  Surgeon: Waynetta Sandy, MD;  Location: [facility];  Service: Cardiovascular;  Laterality: Bilateral;  . AMPUTATION Right 08/11/2019   Procedure: RIGHT ABOVE KNEE AMPUTATION;  Surgeon: Angelia Mould, MD;  Location: [facility];  Service: Vascular;  Laterality: Right;  . BASAL CELL CARCINOMA EXCISION Left    ear  . CARDIAC CATHETERIZATION     "prior to bypass"  . CATARACT EXTRACTION W/ INTRAOCULAR LENS  IMPLANT, BILATERAL Bilateral   . CORONARY ARTERY BYPASS GRAFT  10/2001   LIMA to LAD, SVG to OM1-2, SVG to RCA and PDA  . LITHOTRIPSY    . PROSTATE BIOPSY  Oct. 2014  . TONSILLECTOMY     Past Medical History:  Diagnosis Date  . Anxiety   . Arthritis    "generalized" (04/04/2017)  . Asthma   . Basal cell carcinoma    "left ear"  . BPH (benign prostatic hyperplasia)    severe; s/p multiple biopsies  . CAD (coronary artery disease)   . Carotid artery occlusion   . Chronic respiratory failure ([place])   . Chronic rhinitis   . COPD (chronic obstructive pulmonary disease) ([facility])    2L [place] O2  . Diastolic heart failure ([place]) 2019  . Dilation of biliary tract   . Elevated troponin 03/09/2017  . Gallstones   . GERD (gastroesophageal reflux disease)   . History of blood transfusion    "w/his CABG" (04/04/2017)  . History of kidney stones   . Hyperlipidemia   . Hypertension   . On home oxygen therapy    "~ 24/7" (04/04/2017)  . Peripheral vascular disease ([place])   . Pneumonia 2019  . Squamous cell carcinoma of skin 04/23/2013   in situ-Right flank (txpbx)  . Squamous cell carcinoma of skin 01/23/2017   [place]/ in siut- Left ear rim (CX3FU)  . Syncope and collapse   . Ureteral tumor 08/2015   had  endoscopic procedure for evaluation, unable to reach for biopsy  . Urinary catheter (Foley) change required    Ht 5\' 8"  (1.727 m)   BMI 20.37 kg/m   Opioid Risk Score:   Fall Risk Score:  `1  Depression screen PHQ 2/9  Depression screen [facility] 2/9 01/19/2020 01/01/2020 12/16/2019 11/20/2019 11/20/2019 10/20/2019  Decreased Interest 1 1 1 1 1 2   Down, Depressed, Hopeless 1 1 1  0 0 2  PHQ -  2 Score 2 2 2 1 1 4   Altered sleeping - 3 - - - 3  Tired, decreased energy - 3 - - - 3  Change in appetite - 0 - - - 0  Feeling bad or failure about yourself  - 1 - - - 1  Trouble concentrating - 1 - - - 1  Moving slowly or fidgety/restless - 0 - - - 2  Suicidal thoughts - 0 - - - 0  PHQ-9 Score - 10 - - - 14  Difficult doing work/chores - Somewhat difficult - - - -  Some recent data might be hidden   Review of Systems  Constitutional: Negative.   HENT: Positive for sinus pain. Negative for postnasal drip.        Ear blockage   Eyes: Negative.   Respiratory: Positive for shortness of breath.        Copd  Cardiovascular: Positive for leg swelling.  Endocrine: Negative.   Genitourinary: Positive for difficulty urinating.       Foley cath. Urine retention  Musculoskeletal: Positive for arthralgias and gait problem. Negative for neck pain.       Stump pain Left leg numbness Right foot pain Shoulder pain   Skin: Positive for wound.  Neurological: Positive for dizziness, tremors and weakness.       Vertigo  Hematological: Bruises/bleeds easily.  All other systems reviewed and are negative.     Objective:   Physical Exam  Constitutional: No distress . Vital signs reviewed. Frail.  HENT: Normocephalic.  Atraumatic. Eyes: EOMI. No discharge. Respiratory: DOE. No stridor.  +[place]. GI: Non-distended.  Psych: Slowed. Normal behavior. Neuro: Alert [place] HOH    Assessment & Plan:  Male with history of COPD with chronic hypoxic respiratory failure and oxygen dependent, BPH--Foley in place  for retention, PAF, CAD, PVD presents for follow up for right AKA with multiple medical problems.  1.  Impaired mobility and ADLs secondary to right AKA with significant medical comorbidities             Continue therapies  Continue follow up with Vascular  Told by Vascular stump shrinker not necessary, but may use for pain   2. Phantom limb pain  Unable to tolerate Gabapentin 100 BID due to lethargy, d/ced  Stopped taking Lyrica 50 BID due to concerns for side effects  3. COPD/chronic hypoxic respiratory failure:   Continue supplemental oxygen  4. BPH/renal calculi with spasms:              Continue foley, unable to wean              Meds per urology  5. CAD/Chronic systolic CHF:   Cont meds  Encouraged Cards follow up  6. Gait abnormality  Continue wheelchair for safety.  Continue therapies  7. Depression  Will make referral to Neuropsych  Meds per PCP, encouraged follow up  8. Orthostatsis   Encouraged follow up with Cards regarding compression hose to LLE (told by Vascular limited blood flow)  Improving  9. RLE wound/cellulitis  Will order keflex (doxycline allergy) until follow up with wound care due to concern for sepsis  Per report, frank pus from blister, may require further imaging/workup  Plans to follow up with wound clinic tomorrow

## 2020-01-20 ENCOUNTER — Encounter (HOSPITAL_BASED_OUTPATIENT_CLINIC_OR_DEPARTMENT_OTHER): Payer: Medicare Other | Attending: Internal Medicine | Admitting: Internal Medicine

## 2020-01-20 DIAGNOSIS — S40022A Contusion of left upper arm, initial encounter: Secondary | ICD-10-CM | POA: Diagnosis not present

## 2020-01-20 DIAGNOSIS — S51012A Laceration without foreign body of left elbow, initial encounter: Secondary | ICD-10-CM | POA: Diagnosis not present

## 2020-01-20 DIAGNOSIS — Z89611 Acquired absence of right leg above knee: Secondary | ICD-10-CM | POA: Insufficient documentation

## 2020-01-20 DIAGNOSIS — L03116 Cellulitis of left lower limb: Secondary | ICD-10-CM | POA: Insufficient documentation

## 2020-01-20 DIAGNOSIS — W19XXXA Unspecified fall, initial encounter: Secondary | ICD-10-CM | POA: Diagnosis not present

## 2020-01-20 DIAGNOSIS — L89153 Pressure ulcer of sacral region, stage 3: Secondary | ICD-10-CM | POA: Insufficient documentation

## 2020-01-20 DIAGNOSIS — S41112A Laceration without foreign body of left upper arm, initial encounter: Secondary | ICD-10-CM | POA: Diagnosis not present

## 2020-01-20 DIAGNOSIS — L97521 Non-pressure chronic ulcer of other part of left foot limited to breakdown of skin: Secondary | ICD-10-CM | POA: Diagnosis not present

## 2020-01-20 DIAGNOSIS — S51811A Laceration without foreign body of right forearm, initial encounter: Secondary | ICD-10-CM | POA: Diagnosis not present

## 2020-01-20 LAB — URINALYSIS, ROUTINE W REFLEX MICROSCOPIC
Bilirubin Urine: NEGATIVE
Glucose, UA: NEGATIVE mg/dL
Hgb urine dipstick: NEGATIVE
Ketones, ur: NEGATIVE mg/dL
Nitrite: POSITIVE — AB
Protein, ur: NEGATIVE mg/dL
Specific Gravity, Urine: 1.01 (ref 1.005–1.030)
pH: 6 (ref 5.0–8.0)

## 2020-01-20 LAB — LACTIC ACID, PLASMA: Lactic Acid, Venous: 1.6 mmol/L (ref 0.5–1.9)

## 2020-01-20 MED ORDER — FUROSEMIDE 40 MG PO TABS
20.0000 mg | ORAL_TABLET | Freq: Once | ORAL | Status: AC
  Administered 2020-01-20: 20 mg via ORAL
  Filled 2020-01-20: qty 1

## 2020-01-20 MED ORDER — SODIUM CHLORIDE 0.9 % IV SOLN
100.0000 mg | Freq: Once | INTRAVENOUS | Status: AC
  Administered 2020-01-20: 100 mg via INTRAVENOUS
  Filled 2020-01-20: qty 100

## 2020-01-20 MED ORDER — DOXYCYCLINE HYCLATE 100 MG PO CAPS: 100.0000 mg | ORAL_CAPSULE | Freq: Two times a day (BID) | ORAL | 0 refills | Status: DC

## 2020-01-20 NOTE — ED Notes (Signed)
Initial contact with pt. Wife is with pt at bedside for support.

## 2020-01-20 NOTE — ED Notes (Signed)
Pt has been medicated per MD orders. Pt has ABX infusing and will be d/c'ed after infusion complete.

## 2020-01-20 NOTE — ED Provider Notes (Signed)
[facility] DEPT Provider Note   CSN: [id number] Arrival date & time: 01/19/20  1714     History Chief Complaint  Patient presents with  . Wound Check    [name] is a 87 y.o. male.  87 yo M with chief complaints of breakdown to the dorsal aspects of left foot.  Patient has had some skin breakdown noted yesterday.  Wife feels like she expressed some thick yellow drainage from the area.  She had a video visit today and plans were to start him on antibiotics and to follow-up with wound care tomorrow.  When they called wound care to be seen tomorrow they told them they need to go to the ED immediately to get IV antibiotics.  Patient has felt somewhat crummy for the past 4 to 5 days.  No fevers.  No known injury to the foot.  Patient has a history of heart failure and has significant lower extremity edema at baseline typically takes Lasix 3 times a day with some improvement.  The history is provided by the patient and the spouse.  Wound Check Pertinent negatives include no chest pain, no abdominal pain, no headaches and no shortness of breath.  Illness Severity:  Severe Onset quality:  Sudden Duration:  1 week Timing:  Constant Progression:  Worsening Chronicity:  New Associated symptoms: no abdominal pain, no chest pain, no congestion, no diarrhea, no fever, no headaches, no myalgias, no rash, no shortness of breath and no vomiting        Past Medical History:  Diagnosis Date  . Anxiety   . Arthritis    "generalized" (04/04/2017)  . Asthma   . Basal cell carcinoma    "left ear"  . BPH (benign prostatic hyperplasia)    severe; s/p multiple biopsies  . CAD (coronary artery disease)   . Carotid artery occlusion   . Chronic respiratory failure ([place])   . Chronic rhinitis   . COPD (chronic obstructive pulmonary disease) (HCC)    2L [place] O2  . Diastolic heart failure ([place]) 2019  . Dilation of biliary tract   . Elevated troponin 03/09/2017  .  Gallstones   . GERD (gastroesophageal reflux disease)   . History of blood transfusion    "w/his CABG" (04/04/2017)  . History of kidney stones   . Hyperlipidemia   . Hypertension   . On home oxygen therapy    "~ 24/7" (04/04/2017)  . Peripheral vascular disease ([place])   . Pneumonia 2019  . Squamous cell carcinoma of skin 04/23/2013   in situ-Right flank (txpbx)  . Squamous cell carcinoma of skin 01/23/2017   [place]/ in siut- Left ear rim (CX3FU)  . Syncope and collapse   . Ureteral tumor 08/2015   had endoscopic procedure for evaluation, unable to reach for biopsy  . Urinary catheter (Foley) change required     Patient Active Problem List   Diagnosis Date Noted  . Abnormality of gait 10/20/2019  . Pain of left heel 09/01/2019  . Labile blood pressure   . Anxiety state   . Hypothyroidism   . Right hip pain   . Pressure injury of skin of coccygeal region   . Post-operative pain   . Chronic systolic congestive heart failure ([place])   . Urinary retention due to benign prostatic hyperplasia 08/20/2019  . SOB (shortness of breath)   . Chronic systolic CHF (congestive heart failure) ([place])   . Hypoalbuminemia due to protein-calorie malnutrition ([place])   . Transaminitis   .  Acute blood loss anemia   . Hyponatremia   . Postoperative pain   . Supplemental oxygen dependent   . S/P AKA (above knee amputation) ([place]) 08/14/2019  . Peripheral arterial disease ([name]) 08/11/2019  . Abnormal urinalysis 08/11/2019  . Critical lower limb ischemia ([place]) 08/11/2019  . DNR (do not resuscitate)   . Voiding difficulty   . Palliative care by specialist   . Pressure injury of skin 06/29/2019  . Cellulitis of left lower extremity 06/28/2019  . Cellulitis of right lower extremity 06/27/2019  . AKI (acute kidney injury) ([place]) 06/27/2019  . Headache 10/16/2018  . Severe major depression without psychotic features ([place]) 09/22/2017  . Generalized anxiety disorder 09/22/2017  . Preoperative  cardiovascular examination 05/10/2017  . Weakness generalized 04/04/2017  . Chest pain 03/24/2017  . AV block, [place] 1 03/24/2017  . CAD (coronary artery disease) 03/24/2017  . Chronic diastolic CHF (congestive heart failure) ([place]) 03/24/2017  . COPD (chronic obstructive pulmonary disease) ([place]) 03/24/2017  . NSTEMI (non-ST elevated myocardial infarction) ([place])   . Elevated troponin 03/09/2017  . Acute on chronic diastolic heart failure ([place])   . Tachycardia 03/08/2017  . Influenza A 02/04/2017  . Cor pulmonale, chronic (HCC)/ clinical dx  09/13/2016  . Orthostatic hypotension 09/26/2015  . Angina at rest [facility]) 09/24/2015  . BPH (benign prostatic hyperplasia) 09/13/2015  . GERD (gastroesophageal reflux disease) 09/13/2015  . Anxiety 09/13/2015  . Sinusitis, chronic 09/09/2015  . DOE (dyspnea on exertion) 04/15/2015  . Acute heart failure ([place]) 03/18/2015  . Right calf pain 06/18/2014  . Coronary artery disease involving coronary bypass graft of native heart with angina pectoris ([place])   . Peripheral vascular disease ([place])   . Essential hypertension   . Obstructive sleep apnea   . Rhinitis, chronic 01/29/2007  . COPD   GOLD IV/ 02/steroid dep  01/29/2007  . Chronic respiratory failure with hypoxia and hypercapnia ([place]) 01/29/2007  . Hyperlipidemia     Past Surgical History:  Procedure Laterality Date  . ABDOMINAL AORTOGRAM W/LOWER EXTREMITY Bilateral 06/30/2019   Procedure: ABDOMINAL AORTOGRAM W/LOWER EXTREMITY;  Surgeon: Waynetta Sandy, MD;  Location: [facility];  Service: Cardiovascular;  Laterality: Bilateral;  . AMPUTATION Right 08/11/2019   Procedure: RIGHT ABOVE KNEE AMPUTATION;  Surgeon: Angelia Mould, MD;  Location: [facility];  Service: Vascular;  Laterality: Right;  . BASAL CELL CARCINOMA EXCISION Left    ear  . CARDIAC CATHETERIZATION     "prior to bypass"  . CATARACT EXTRACTION W/ INTRAOCULAR LENS  IMPLANT, BILATERAL Bilateral   . CORONARY  ARTERY BYPASS GRAFT  10/2001   LIMA to LAD, SVG to OM1-2, SVG to RCA and PDA  . LITHOTRIPSY    . PROSTATE BIOPSY  Oct. 2014  . TONSILLECTOMY         Family History  Problem Relation Age of Onset  . Heart disease Mother   . Heart disease Father        Before age 69  . Breast cancer Mother   . Cancer Mother        Breast cancer  . Hypertension Mother   . Other Mother        AAA  and   Amputation  . Heart attack Father   . Stroke Brother        x3, still living   . Peripheral vascular disease Brother   . Heart disease Brother   . Hyperlipidemia Brother   . Hypertension Brother   . Heart  disease Brother   . Allergies Brother     Social History   Tobacco Use  . Smoking status: Former Smoker    Packs/day: 2.00    Years: 33.00    Pack years: 66.00    Types: Cigarettes    Quit date: 03/25/1982    Years since quitting: 37.8  . Smokeless tobacco: Never Used  Vaping Use  . Vaping Use: Never used  Substance Use Topics  . Alcohol use: Yes    Alcohol/week: 0.0 standard drinks    Comment: 1.5-3 oz daily in the past, minimal use in the last few months  . Drug use: No    Home Medications Prior to Admission medications   Medication Sig Start Date End Date Taking? Authorizing Provider  doxycycline (VIBRAMYCIN) 100 MG capsule Take 1 capsule (100 mg total) by mouth 2 (two) times daily. One po bid x 7 days 01/20/20  Yes Deno Etienne, DO  ALPRAZolam Duanne Moron) 0.5 MG tablet Take 1 tablet (0.5 mg total) by mouth 3 (three) times daily. 12/24/19   Tanda Rockers, MD  aspirin 81 MG tablet Take 81 mg by mouth daily.    [provider]  atorvastatin (LIPITOR) 20 MG tablet TAKE 1 TABLET BY MOUTH EVERY DAY Patient taking differently: Take 20 mg by mouth at bedtime. 05/24/16   Belva Crome, MD  calcium carbonate (TUMS - DOSED IN MG ELEMENTAL CALCIUM) 500 MG chewable tablet Chew 1 tablet (200 mg of elemental calcium total) by mouth 2 (two) times daily as needed for indigestion or  heartburn. 09/01/19   Love, Ivan Anchors, PA-C  cephALEXin (KEFLEX) 500 MG capsule Take 1 capsule (500 mg total) by mouth 4 (four) times daily. 01/19/20   Jamse Arn, MD  Cholecalciferol (VITAMIN D3) 2000 units TABS Take 2,000 Units by mouth daily with lunch.     [provider]  famotidine (PEPCID) 20 MG tablet Take 20 mg by mouth at bedtime.     [provider]  finasteride (PROSCAR) 5 MG tablet Take 5 mg by mouth daily with lunch.     [provider]  fluticasone (FLONASE) 50 MCG/ACT nasal spray Place 2 sprays into both nostrils daily.    [provider]  furosemide (LASIX) 20 MG tablet Take two tablets by mouth every morning.  Take one tablet by mouth every evening. 12/08/19   Belva Crome, MD  levalbuterol [facility] HFA) 45 MCG/ACT inhaler Inhale 2 puffs into the lungs every 4 (four) hours as needed for wheezing or shortness of breath. 04/14/19   Tanda Rockers, MD  levalbuterol (XOPENEX) 0.63 MG/3ML nebulizer solution Take 3 mLs (0.63 mg total) by nebulization every 4 (four) hours as needed for wheezing or shortness of breath. 10/13/19   Tanda Rockers, MD  levothyroxine (SYNTHROID) 50 MCG tablet Take 50 mcg by mouth daily. 10/27/19   [provider]  metoprolol succinate (TOPROL-XL) 25 MG 24 hr tablet Take 0.5 tablets (12.5 mg total) by mouth daily. 07/11/19   Belva Crome, MD  mometasone (ELOCON) 0.1 % cream Apply topically daily as needed. 11/10/19   [provider]  Multiple Vitamins-Minerals (PRESERVISION AREDS 2) CAPS Take 1 capsule by mouth daily.    [provider]  MYRBETRIQ 25 MG TB24 tablet Take 25 mg by mouth daily. 12/12/19   [provider]  NITROSTAT 0.4 MG SL tablet Place 0.4 mg under the tongue every 5 (five) minutes as needed for chest pain.    [provider]  OXYGEN Inhale 2 L/min into the lungs continuous.     [provider]  pantoprazole (PROTONIX) 40 MG tablet TAKE 1 TABLET(40 MG) BY  MOUTH DAILY 30 TO 60 MINUTES BEFORE FIRST MEAL OF THE DAY 10/13/19   Nyoka Cowden, MD  Polyethyl Glycol-Propyl Glycol (SYSTANE) 0.4-0.3 % SOLN Apply 1 drop to eye 2 (two) times daily as needed (eye irritation). 09/01/19   Love, Evlyn Kanner, PA-C  polyethylene glycol (MIRALAX / GLYCOLAX) 17 g packet Take 17 g by mouth daily. 09/01/19   Love, Evlyn Kanner, PA-C  potassium chloride 20 MEQ/15ML (10%) SOLN Take 20 mEq by mouth daily. 10/16/19   [provider]  predniSONE (DELTASONE) 10 MG tablet FOR FLARE OF COUGH/WHEEZING MAY INCREASE TO 2 DAILY BETTER, THAN 1 TABLET BY MOUTH EVERY DAY 10/13/19   Nyoka Cowden, MD  sodium chloride (MURO 128) 5 % ophthalmic solution Place 1 drop into both eyes 4 (four) times daily. 08/22/19   Love, Evlyn Kanner, PA-C  spironolactone (ALDACTONE) 25 MG tablet Take 25 mg by mouth daily.    [provider]  SYMBICORT 160-4.5 MCG/ACT inhaler Inhale 2 puffs into the lungs 2 (two) times daily. 06/06/19   Nyoka Cowden, MD  Tiotropium Bromide Monohydrate (SPIRIVA RESPIMAT) 2.5 MCG/ACT AERS INHALE 2 PUFFS BY MOUTH EVERY DAY Patient taking differently: Inhale 2 puffs into the lungs daily. 05/14/19   Nyoka Cowden, MD  vitamin B-12 (CYANOCOBALAMIN) 1000 MCG tablet Take 1,000 mcg by mouth daily.     [provider]  Zinc Oxide (TRIPLE PASTE) 12.8 % ointment Apply topically as needed for irritation. To gluteal cleft/buttocks 09/01/19   Love, Evlyn Kanner, PA-C    Allergies    Cefdinir, Nitrofurantoin, Sulfa antibiotics, Sulfonamide derivatives, Tape, Ciprofloxacin, Nitrofurantoin, Amoxicillin er, Doxycycline, Levaquin [levofloxacin], and Sertraline  Review of Systems   Review of Systems  Constitutional: Negative for chills and fever.  HENT: Negative for congestion and facial swelling.   Eyes: Negative for discharge and visual disturbance.  Respiratory: Negative for shortness of breath.   Cardiovascular: Negative for chest pain and palpitations.  Gastrointestinal:  Negative for abdominal pain, diarrhea and vomiting.  Musculoskeletal: Negative for arthralgias and myalgias.  Skin: Positive for wound. Negative for color change and rash.  Neurological: Negative for tremors, syncope and headaches.  Psychiatric/Behavioral: Negative for confusion and dysphoric mood.    Physical Exam Updated Vital Signs BP 127/78 (BP Location: Right Arm)   Pulse 85   Temp 98.4 F (36.9 C) (Oral)   Resp 19   SpO2 99%   Physical Exam Vitals and nursing note reviewed.  Constitutional:      Appearance: He is well-developed and well-nourished.  HENT:     Head: Normocephalic and atraumatic.  Eyes:     Extraocular Movements: EOM normal.     Pupils: Pupils are equal, round, and reactive to light.  Neck:     Vascular: No JVD.  Cardiovascular:     Rate and Rhythm: Normal rate and regular rhythm.     Heart sounds: No murmur heard. No friction rub. No gallop.   Pulmonary:     Effort: No respiratory distress.     Breath sounds: No wheezing.  Abdominal:     General: There is no distension.     Tenderness: There is no guarding or rebound.  Musculoskeletal:        General: Normal range of motion.     Cervical back: Normal range of motion and neck supple.  Skin:    Coloration: Skin is not pale.     Findings: No rash.     Comments: Left foot is cold to touch.  Leg warms up by about mid calf.  No palpable pulses.  No dopplerable pulses.  Mild area of extending erythema just above the ankle.  Patient has superficial skin breakdown to the dorsal aspect of the third fourth and fifth digit.  I am able to express some serous appearing liquid though no purulence.  No area of fluctuance or induration.  Neurological:     Mental Status: He is alert and oriented to person, place, and time.  Psychiatric:        Mood and Affect: Mood and affect normal.        Behavior: Behavior normal.     ED Results / Procedures / Treatments   Labs (all labs ordered are listed, but only  abnormal results are displayed) Labs Reviewed  COMPREHENSIVE METABOLIC PANEL - Abnormal; Notable for the following components:      Result Value   Chloride 93 (*)    Glucose, Bld 133 (*)    BUN 33 (*)    Creatinine, Ser 1.35 (*)    ALT 60 (*)    GFR, Estimated 50 (*)    All other components within normal limits  CBC WITH DIFFERENTIAL/PLATELET - Abnormal; Notable for the following components:   WBC 14.5 (*)    RBC 3.87 (*)    Hemoglobin 12.5 (*)    HCT 37.6 (*)    Neutro Abs 13.4 (*)    Lymphs Abs 0.4 (*)    Abs Immature Granulocytes 0.26 (*)    All other components within normal limits  URINALYSIS, ROUTINE W REFLEX MICROSCOPIC - Abnormal; Notable for the following components:   APPearance HAZY (*)    Nitrite POSITIVE (*)    Leukocytes,Ua LARGE (*)    Bacteria, UA RARE (*)    All other components within normal limits  LACTIC ACID, PLASMA  LACTIC ACID, PLASMA    EKG None  Radiology No results found.  Procedures Procedures (including critical care time)  Medications Ordered in ED Medications  doxycycline (VIBRAMYCIN) 100 mg in sodium chloride 0.9 % 250 mL IVPB (100 mg Intravenous New Bag/Given 01/20/20 0124)  furosemide (LASIX) tablet 20 mg (20 mg Oral Given 01/20/20 0114)    ED Course  I have reviewed the triage vital signs and the nursing notes.  Pertinent labs & imaging results that were available during my care of the patient were reviewed by me and considered in my medical decision making (see chart for details).    MDM Rules/Calculators/A&P                          87 yo M with a chief complaint of concern for a skin infection.  Had called the wound care center who told him he needed to come to the ED immediately for IV antibiotics.  Patient's foot is cold to the touch and has no pulses.  Looking back at prior vascular surgery notes the patient has known disease, had previously had monophasic pulses at least back in November.  It looks like they were not  documented in December.  Will discuss with vascular.  Unfortunately the patient has multiple antibiotic allergies.  After discussing with the pharmacist even though he has listed an antibiotic allergy to doxycycline due to its vague nature and the family's opposition to taking cephalosporins as an  outpatient recommended giving doxycycline.   I discussed the case with Dr. Stanford Breed, vascular.  Felt reasonable to try and see in the office tomorrow.  Family amenable to plan.    3:16 AM:  I have discussed the diagnosis/risks/treatment options with the patient and family and believe the pt to be eligible for discharge home to follow-up with Vascular, wound care center. We also discussed returning to the ED immediately if new or worsening sx occur. We discussed the sx which are most concerning (e.g., sudden worsening pain, fever, inability to tolerate by mouth, rapid spreading redness) that necessitate immediate return. Medications administered to the patient during their visit and any new prescriptions provided to the patient are listed below.  Medications given during this visit Medications  doxycycline (VIBRAMYCIN) 100 mg in sodium chloride 0.9 % 250 mL IVPB (100 mg Intravenous New Bag/Given 01/20/20 0124)  furosemide (LASIX) tablet 20 mg (20 mg Oral Given 01/20/20 0114)     The patient appears reasonably screen and/or stabilized for discharge and I doubt any other medical condition or other [facility] requiring further screening, evaluation, or treatment in the ED at this time prior to discharge.    There has been no study to my knowledge that shows a benefit of IV antibiotics for a patient being discharge from the hospital for a skin infection.  In fact there was a Cochrane review that would suggest that oral treatment is superior and that IV administration is associated with increased risk of diarrhea.   Kilburn, SA et al. Interventions for cellulitis and erysipelas. Cochrane Database Syst Rev. 2010 Jun  16;(6):CD004299. PMID: [id number]   Final Clinical Impression(s) / ED Diagnoses Final diagnoses:  Skin breakdown    Rx / DC Orders ED Discharge Orders         Ordered    doxycycline (VIBRAMYCIN) 100 MG capsule  2 times daily        01/20/20 [place], [place], DO 01/20/20 [phone]

## 2020-01-20 NOTE — Progress Notes (Signed)
[name], [name] ([id number]) Visit Report for 01/20/2020 Debridement Details Patient Name: Date of Service: DO RN, [place] V ID L. 01/20/2020 2:00 PM Medical Record Number: [id number] Patient Account Number: [id number] Date of Birth/Sex: Treating RN: [birth year] (87 y.o. [name], [name] Primary Care Provider: [name] Other Clinician: Referring Provider: Treating Provider/Extender: [name] in Treatment: 10 Debridement Performed for Assessment: Wound #15 Sacrum Performed By: Physician Ricard Dillon., MD Debridement Type: Debridement Level of Consciousness (Pre-procedure): Awake and Alert Pre-procedure Verification/Time Out Yes - 15:45 Taken: Start Time: 15:45 Pain Control: Lidocaine T Area Debrided (L x W): otal 0.5 (cm) x 0.3 (cm) = 0.15 (cm) Tissue and other material debrided: Viable, Non-Viable, Subcutaneous, Skin: Dermis Level: Skin/Subcutaneous Tissue Debridement Description: Excisional Instrument: Curette Bleeding: Minimum Hemostasis Achieved: Pressure End Time: 15:46 Procedural Pain: 0 Post Procedural Pain: 0 Response to Treatment: Procedure was tolerated well Level of Consciousness (Post- Awake and Alert procedure): Post Debridement Measurements of Total Wound Length: (cm) 0.5 Stage: Category/Stage III Width: (cm) 0.3 Depth: (cm) 0.2 Volume: (cm) 0.024 Character of Wound/Ulcer Post Debridement: Improved Post Procedure Diagnosis Same as Pre-procedure Electronic Signature(s) Signed: 01/20/2020 5:07:05 PM By: [name] MD Signed: 01/20/2020 5:35:07 PM By: Rhae Hammock RN Entered By: [name] on 01/20/2020 16:24:39 -------------------------------------------------------------------------------- HPI Details Patient Name: Date of Service: DO RN, [name] 01/20/2020 2:00 PM Medical Record Number: [id number] Patient Account Number: [id number] Date of Birth/Sex: Treating RN: Jun 11, 1929 (87 y.o. [name] Primary Care Provider: [name] Other Clinician: Referring Provider: Treating Provider/Extender: [name] in Treatment: 10 History of Present Illness HPI Description: 01/30/18 on evaluation today patient presents for initial inspection concerning the skin tear of the right forearm. Fortunately there does not appear to be any evidence of infection and this occurred approximately three days ago. He has been using Vaseline over the region. With that being said the patient does have a history of cataracts COPD, hypertension, peripheral arterial disease, and osteoarthritis. He tells me that he does have a little bit of pain at the site but nothing too significant at this time which is good news. Fortunately this overall appears to be doing fairly well which is good news. No fevers, chills, nausea, or vomiting noted at this time. The biggest issue at this point is that the wound is of quite significant size. READMISSION 04/21/2019 This is a 87 year old man with multiple medical problems who was seen once in the clinic here in January 2020 with a skin tear on his right forearm seen by [name]. This problem this time started sometime recently. He was noted by podiatry to have erosions and wounds of his right foot on toes 1-3. He also saw [name] of dermatology who gave him Silvadene cream for what I think was felt to be a stasis dermatitis of his bilateral lower legs. At some point he was referred to [name]. He was also noted to have previous noninvasive arterial studies that showed no flow to either one of his toes and noncompressible ABIs bilaterally. [name] did noninvasive arterial studies on him. These showed that he had greater than 75% stenosis in the proximal and mid common femoral artery. Monophasic flow in the posterior tibial and dorsalis pedis positions on the right foot. ABIs again were noncompressible. He was felt to have total occlusion of  the superficial femoral artery on the right. The overall interpretation is that he had severe multilevel arterial occlusive disease. Absent  femoral pulses and superficial femoral artery occlusions. In spite of this he was not felt to be candidate for arteriography given his bilateral femoral artery occlusions as well he would not be a candidate for endovascular approach secondary to his common femoral artery occlusions and proximal iliac disease. He was not felt to be a candidate for an open infra inguinal bypass. He therefore felt he would need to be monitored over time with the only option being a right below-knee amputation The patient complains currently of pain at night when he is up in bed. The pain in the right foot is better when he puts the leg down over the bed side. This is compatible with rest pain. He has very limited activity i.e. is even exhausted getting dressed in the morning because of cardiopulmonary issues therefore it is difficult to gauge claudication I believe he was referred here by Dr. [name] with one of the major issues is whether he would be a candidate for hyperbaric oxygen. He is not a diabetic. Dr. [name] did give him Silvadene cream for dry flaking skin in his bilateral lower extremities, however the patient is concerned about using this in the face of sulfa allergy. 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 This is on the medial aspect extended there is a second area on the plantar aspect. Most of what the patient talks about is claudication with minimal activity [however the patient is limited by his COPD], or even at rest at night. He is  not getting a lot of sleep. We use silver collagen on this wound 5/4; the patient has a wound on the tip of his right great toe also on the medial aspect. Setting of severe nonrevascularizable PAD [see description in HPI]. He does not describe any change in his pain. We have been using silver collagen 5/18; 2-week follow-up. The patient has wound on the tip of his right great toe as well as the medial aspect. He has developed new wounds x2 on the right lateral foot and the right lateral calcaneus. These look like ischemic areas. The patient talks about claudication at night. He seems to be more comfortable during the day. We have been using silver collagen to the wounds. I have previously debrided the eschar on the toes however this comes right back. 6/1; 2-week follow-up. This patient has ischemic wounds on his right foot in the setting of nonunreconstructable PAD [previously reviewed by Dr. Dixon]. He does not really describe severe pain but he does have episodic pain in the right great toe. He has an area on the tip of the right great toe as well as the medial aspect of the right great toe. A new area on the tip of the right fourth toe. He has the area on the right lateral foot fortunately the area on the right lateral heel appears to have epithelialized over. We are using silver collagen to all wounds 06/23/2019 upon evaluation today patient appears to be doing about the same in regard to his foot ulcers. Unfortunately he has 2 new skin tears on his left elbow and the right forearm that were new as of today. He states that it was doing okay until they went  to change the dressing and they put some collagen on it that got stuck and somewhat pulled back the skin unfortunately. Fortunately there is no signs of active infection at this time. No fevers, chills, nausea, vomiting, or diarrhea. 6/29; the patient was hospitalized from 6/18 through 6/23 going in with increasing drainage from the wounds on his  right foot. He underwent a angiogram on 06/30/2019 by Dr. Donzetta Matters which revealed multilevel arterial disease including occlusion of the right common femoral artery. He reconstitutes the profunda femoris and the below-knee popliteal artery and appears to have posterior tibial peroneal runoff to the ankle although there is heavy calcification of the popliteal below the knee. He was offered consideration of a right above-knee amputation, palliative and/or hospice care and consideration of a very high risk attempt at bypass. He did receive IV antibiotics. He comes in the clinic today again with the 2 areas that are necrotic on the left great toe and a small punched out area on the right lateral foot. A lot of the erythema on top of the right foot but no warmth. He has a lot of swelling here. The patient is literally tortured over this decision that he needs to make. I went over this with him in exceptional detail. I do not believe he is a candidate for any form of open bypass surgery. I think his vascular doctors would agree with that. He still has pain at night when he puts his leg up on the bed that is relieved by putting his leg dependent. This is not different from what I remember. He is not septic there is no additional skin breakdown that I can see 7/8 the patient continues to have severe pain in the right foot at night making it difficult for him to rest. There is increasing erythema in the right foot but the foot is cold. I do not think this is an infection I think this is likely to be tissue necrosis from ischemia. I thought he would have made an up decision today about going forward with hospice he has not. He did not go to see [name] on 7/6. Again he wants to talk to me at length about amputations. I do not think he will do well after an above-knee amputation on the right. Furthermore I do not think he is a candidate for bypass surgery on the right leg 7/20; patient still having a lot of pain in  the right foot. He saw Dr. Donzetta Matters on 7/16. He is not felt to be able to undergo a complex revascularization and I certainly agree with that. He was offered a right below-knee amputation but he has not made that decision. I have repetitively suggested hospice care if he will not undergo an amputation he is not decided he wants to go that route either. He arrives in the clinic today accompanied by his son who is a Stage manager and his wife. I think the patient has tissue breakdown in the dorsal foot secondary to ischemia although his son brought up that some of the erythema could be a contact dermatitis as they were apparently layering a large area of pure call over the wound area. I was not aware of this not supposed to be doing it in this fashion. In any case the erythema look better than last week and I am quite convinced this is not cellulitis READMISSION 11/11/2019 Mr. Paula Libra is now an 87 year old man who we had in this clinic from [place] through July/21 largely as a  result of ischemic wounds in his right foot. He was followed by vein and vascular [name]. His anatomy was not felt to be amenable to a revascularization. The patient had increasing ischemic pain but was very resistant to the notion of an amputation. Eventually he did go through with this on August 22 he will underwent an AKA by [name]. He went to rehabilitation. There he developed a small stage III lower sacral pressure ulcer and an area on his left lateral heel which is also probably a pressure ulcer. They have been using silver collagen on both wound areas. The patient states he is not in any unrelieved pain. He is in a wheelchair at home with a Roho cushion. He is able to do his own transfers. I think he is probably putting far too much pressure on these areas Past medical history is reviewed. He has a history of CHF, severe PAD, hypertension, coronary artery disease status post CABG, COPD Gold stage IV on O2, hypothyroidism and  a recent problem with urinary retention he now has a Foley catheter. He has had problems with left leg swelling and adjustment of his diuretics he is followed by Dr. Daneen Schick. The patient has severe PAD. On 06/30/2019 he had an angiogram on the remaining left side he had heavily diseased femoral artery. His superficial femoral artery was flush occluded with runoff in the profunda. He was felt his SFA was likely occluded and reconstitutes the popliteal artery via runoff via the peroneal and posterior tibial artery. As noted he is not in a lot of pain. His last noninvasive study was on 6/20. On the left side this was noncompressible ABI but a TBI of only 0.20 with dampened monophasic waveforms 11/25/2019. Patient has 2 wound areas 1 on the left lateral heel and a ulcer on his coccyx. These are probably all pressure related although he has significant PAD complicating any attempt to heal a wound on the left heel. We have been using silver collagen on the wound and the coccyx silver alginate on the heel. 11/30; miraculously the left lateral heel wound has healed. The area on his coccyx is not much better. We have been using Prisma He apparently went back to see [name]. His wife paraphrasing says that he had terrible blood flow in the left lower extremity but he is not a candidate for revascularization. Fortunately had enough blood flow to heal the wound I will try to look at Dr. Mee Hives note by the time these next year 12/14; the area on his left lateral heel remains closed The area on his coccyx is about the same in terms of surface area and depth minimal debris however but it is still adherent. We have been using Iodoflex. They stop me before I left the room to show me his right AKA stump. He tells me he is having a lot of pain in this area that is episodic it almost looks like there is bruising in this area but the bruising looks old I asked him about trauma but they were not aware of any. They see  vascular tomorrow 12/28; the area on the coccyx looks absolutely no better. Completely lifeless surface of fibrinous debris and surrounding raised skin. We have been using Iodoflex this is simply not working. He had a fall when he got up forgetting that he was missing his right leg he has skin tears on the right humerus area just above the elbow and on the right forearm. 1/11; the area  on the coccyx is about the same we have been using Santyl although the surface of this is no better. Small punched out wound. He also has skin tears on his arm which are drying up gradually. His wife had called urgently yesterday to be seen. Apparently he developed erythema and swelling of his left foot over the weekend and she was able to express a teaspoon of pus coming from the dorsal foot. She had called her podiatrist who prescribed Keflex however in response to advice from our office she took him to the emergency room. They apparently gave him IV antibiotics and discharged him on doxycycline. His white count was 14.5 with 92% neutrophils. He is not systemically unwell and does not really complain of a lot of pain Electronic Signature(s) Signed: 01/20/2020 5:07:05 PM By: [name] MD Entered By: [name] on 01/20/2020 16:26:12 -------------------------------------------------------------------------------- Physical Exam Details Patient Name: Date of Service: DO RN, [name] 01/20/2020 2:00 PM Medical Record Number: [id number] Patient Account Number: [id number] Date of Birth/Sex: Treating RN: 07-23-1929 (87 y.o. [name] Primary Care Provider: [name] Other Clinician: Referring Provider: Treating Provider/Extender: [name] in Treatment: 10 Constitutional Patient is hypertensive.. Pulse regular and within target range for patient.Marland Kitchen Respirations regular, non-labored and within target range.. Temperature is normal and within the target range for the  patient.Marland Kitchen Appears in no distress. Cardiovascular Pedal pulses are absent in the left foot and left popliteal foot is cool.. There is pitting edema and erythema involving the dorsal foot extending just above the ankle.. Notes Wound exam Coccyx simply does not look any better at all. I used a #3 curette to remove debris from the surface. Still using Santyl. The excoriations on his left arm from skin tears are drying up that I think closing. She has epithelial loss in the left fourth and fifth toes however this does not look to be threatening Electronic Signature(s) Signed: 01/20/2020 5:07:05 PM By: [name] MD Entered By: [name] on 01/20/2020 16:27:49 -------------------------------------------------------------------------------- Physician Orders Details Patient Name: Date of Service: DO RN, [name] 01/20/2020 2:00 PM Medical Record Number: [id number] Patient Account Number: [id number] Date of Birth/Sex: Treating RN: 26-Jan-1929 (87 y.o. [name] Primary Care Provider: [name] Other Clinician: Referring Provider: Treating Provider/Extender: [name] in Treatment: 10 Verbal / Phone Orders: No Diagnosis Coding Follow-up Appointments Return Appointment in 2 weeks. Bathing/ Shower/ Hygiene May shower and wash wound with soap and water. - with dressing changes only. Off-Loading Turn and reposition every 2 hours [place] wound care orders this week; continue Home Health for wound care. May utilize formulary equivalent dressing for wound treatment orders unless otherwise specified. Other Home Health Orders/Instructions: - [facility]ound Treatment Wound #15 - Sacrum Cleanser: Wound Cleanser (Home Health) 1 x Per Day/30 Days Discharge Instructions: Cleanse the wound with wound cleanser prior to applying a clean dressing using gauze sponges, not tissue or cotton balls. Peri-Wound Care: Skin Prep (Home Health) 1 x Per  Day/30 Days Discharge Instructions: Use skin prep as directed Prim Dressing: Santyl Ointment ([place]) 1 x Per Day/30 Days ary Discharge Instructions: Apply nickel thick amount to wound bed as instructed Secondary Dressing: ComfortFoam Border, 3x3 in (silicone border) ([place]) 1 x Per Day/30 Days Discharge Instructions: Apply over primary dressing as directed. Wound #16 - Upper Arm Wound Laterality: Left, Proximal Cleanser: Normal Saline 2 x Per Week/30 Days Discharge Instructions: Cleanse the wound with Normal  Saline prior to applying a clean dressing using gauze sponges, not tissue or cotton balls. Prim Dressing: Xeroform Occlusive Gauze Dressing, 4x4 in (Home Health) 2 x Per Week/30 Days ary Discharge Instructions: Apply to wound bed as instructed Secondary Dressing: Woven Gauze Sponge, Non-Sterile 4x4 in (Home Health) 2 x Per Week/30 Days Discharge Instructions: Apply over primary dressing as directed. Secured With: [employer], 4.5x3.1 (in/yd) (Home Health) 2 x Per Week/30 Days Discharge Instructions: Secure with Kerlix as directed. Secured With: Paper Tape, 2x10 (in/yd) (Home Health) 2 x Per Week/30 Days Discharge Instructions: Secure dressing with tape as directed. Wound #17 - Upper Arm Wound Laterality: Left, Distal Cleanser: Normal Saline 2 x Per Week/30 Days Discharge Instructions: Cleanse the wound with Normal Saline prior to applying a clean dressing using gauze sponges, not tissue or cotton balls. Prim Dressing: Xeroform Occlusive Gauze Dressing, 4x4 in (Home Health) 2 x Per Week/30 Days ary Discharge Instructions: Apply to wound bed as instructed Secondary Dressing: Woven Gauze Sponge, Non-Sterile 4x4 in (Home Health) 2 x Per Week/30 Days Discharge Instructions: Apply over primary dressing as directed. Secured With: [employer], 4.5x3.1 (in/yd) (Home Health) 2 x Per Week/30 Days Discharge Instructions: Secure with Kerlix as directed. Secured With:  Paper Tape, 2x10 (in/yd) (Home Health) 2 x Per Week/30 Days Discharge Instructions: Secure dressing with tape as directed. Electronic Signature(s) Signed: 01/20/2020 5:07:05 PM By: [name] MD Signed: 01/20/2020 5:35:07 PM By: Rhae Hammock RN Entered By: Rhae Hammock on 01/20/2020 15:51:40 -------------------------------------------------------------------------------- Problem List Details Patient Name: Date of Service: DO RN, [name] 01/20/2020 2:00 PM Medical Record Number: [id number] Patient Account Number: [id number] Date of Birth/Sex: Treating RN: February 28, 1929 (87 y.o. [name], [name] Primary Care Provider: [name] Other Clinician: Referring Provider: Treating Provider/Extender: [name] in Treatment: 10 Active Problems ICD-10 Encounter Code Description Active Date MDM Diagnosis L89.153 Pressure ulcer of sacral region, stage 3 11/11/2019 No Yes Z89.611 Acquired absence of right leg above knee 11/11/2019 No Yes S40.022D Contusion of left upper arm, subsequent encounter 01/06/2020 No Yes L03.116 Cellulitis of left lower limb 01/20/2020 No Yes Inactive Problems ICD-10 Code Description Active Date Inactive Date L89.623 Pressure ulcer of left heel, stage 3 11/11/2019 11/11/2019 Resolved Problems Electronic Signature(s) Signed: 01/20/2020 5:07:05 PM By: [name] MD Entered By: [name] on 01/20/2020 16:24:18 -------------------------------------------------------------------------------- Progress Note Details Patient Name: Date of Service: DO RN, [name] 01/20/2020 2:00 PM Medical Record Number: [id number] Patient Account Number: [id number] Date of Birth/Sex: Treating RN: 1929-10-22 (87 y.o. [name] Primary Care Provider: [name] Other Clinician: Referring Provider: Treating Provider/Extender: [name] in Treatment: 10 Subjective History of Present Illness  (HPI) 01/30/18 on evaluation today patient presents for initial inspection concerning the skin tear of the right forearm. Fortunately there does not appear to be any evidence of infection and this occurred approximately three days ago. He has been using Vaseline over the region. With that being said the patient does have a history of cataracts COPD, hypertension, peripheral arterial disease, and osteoarthritis. He tells me that he does have a little bit of pain at the site but nothing too significant at this time which is good news. Fortunately this overall appears to be doing fairly well which is good news. No fevers, chills, nausea, or vomiting noted at this time. The biggest issue at this point is that the wound is of quite significant size. READMISSION 04/21/2019 This is a 87 year old man  with multiple medical problems who was seen once in the clinic here in January 2020 with a skin tear on his right forearm seen by [name]. This problem this time started sometime recently. He was noted by podiatry to have erosions and wounds of his right foot on toes 1-3. He also saw [name] of dermatology who gave him Silvadene cream for what I think was felt to be a stasis dermatitis of his bilateral lower legs. At some point he was referred to [name]. He was also noted to have previous noninvasive arterial studies that showed no flow to either one of his toes and noncompressible ABIs bilaterally. [name] did noninvasive arterial studies on him. These showed that he had greater than 75% stenosis in the proximal and mid common femoral artery. Monophasic flow in the posterior tibial and dorsalis pedis positions on the right foot. ABIs again were noncompressible. He was felt to have total occlusion of the superficial femoral artery on the right. The overall interpretation is that he had severe multilevel arterial occlusive disease. Absent femoral pulses and superficial femoral artery occlusions. In spite  of this he was not felt to be candidate for arteriography given his bilateral femoral artery occlusions as well he would not be a candidate for endovascular approach secondary to his common femoral artery occlusions and proximal iliac disease. He was not felt to be a candidate for an open infra inguinal bypass. He therefore felt he would need to be monitored over time with the only option being a right below-knee amputation The patient complains currently of pain at night when he is up in bed. The pain in the right foot is better when he puts the leg down over the bed side. This is compatible with rest pain. He has very limited activity i.e. is even exhausted getting dressed in the morning because of cardiopulmonary issues therefore it is difficult to gauge claudication I believe he was referred here by Dr. [name] with one of the major issues is whether he would be a candidate for hyperbaric oxygen. He is not a diabetic. Dr. [name] did give him Silvadene cream for dry flaking skin in his bilateral lower extremities, however the patient is concerned about using this in the face of sulfa allergy. 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 This is on the medial aspect extended there is a second area on the plantar aspect. Most of what the patient talks about is claudication with minimal activity [however the patient is limited by his COPD], or even at rest at night. He is not getting a lot of sleep. We use silver collagen on this wound 5/4; the patient has a wound on the tip of his right great toe also on the medial aspect. Setting of severe nonrevascularizable PAD [see  description in HPI]. He does not describe any change in his pain. We have been using silver collagen 5/18; 2-week follow-up. The patient has wound on the tip of his right great toe as well as the medial aspect. He has developed new wounds x2 on the right lateral foot and the right lateral calcaneus. These look like ischemic areas. The patient talks about claudication at night. He  seems to be more comfortable during the day. We have been using silver collagen to the wounds. I have previously debrided the eschar on the toes however this comes right back. 6/1; 2-week follow-up. This patient has ischemic wounds on his right foot in the setting of nonunreconstructable PAD [previously reviewed by Dr. Dixon]. He does not really describe severe pain but he does have episodic pain in the right great toe. He has an area on the tip of the right great toe as well as the medial aspect of the right great toe. A new area on the tip of the right fourth toe. He has the area on the right lateral foot fortunately the area on the right lateral heel appears to have epithelialized over. We are using silver collagen to all wounds 06/23/2019 upon evaluation today patient appears to be doing about the same in regard to his foot ulcers. Unfortunately he has 2 new skin tears on his left elbow and the right forearm that were new as of today. He states that it was doing okay until they went to change the dressing and they put some collagen on it that got stuck and somewhat pulled back the skin unfortunately. Fortunately there is no signs of active infection at this time. No fevers, chills, nausea, vomiting, or diarrhea. 6/29; the patient was hospitalized from 6/18 through 6/23 going in with increasing drainage from the wounds on his right foot. He underwent a angiogram on 06/30/2019 by Dr. Randie Heinz which revealed multilevel arterial disease including occlusion of the right common femoral artery. He reconstitutes the profunda  femoris and the below-knee popliteal artery and appears to have posterior tibial peroneal runoff to the ankle although there is heavy calcification of the popliteal below the knee. He was offered consideration of a right above-knee amputation, palliative and/or hospice care and consideration of a very high risk attempt at bypass. He did receive IV antibiotics. He comes in the clinic today again with the 2 areas that are necrotic on the left great toe and a small punched out area on the right lateral foot. A lot of the erythema on top of the right foot but no warmth. He has a lot of swelling here. The patient is literally tortured over this decision that he needs to make. I went over this with him in exceptional detail. I do not believe he is a candidate for any form of open bypass surgery. I think his vascular doctors would agree with that. He still has pain at night when he puts his leg up on the bed that is relieved by putting his leg dependent. This is not different from what I remember. He is not septic there is no additional skin breakdown that I can see 7/8 the patient continues to have severe pain in the right foot at night making it difficult for him to rest. There is increasing erythema in the right foot but the foot is cold. I do not think this is an infection I think this is likely to be tissue necrosis from ischemia. I thought he would have made an up decision today about going forward with hospice he has not. He did not go to see Dr. Durwin Nora on 7/6. Again he wants to talk to me at length about amputations. I do not think he will do well after an above-knee amputation on the right. Furthermore I do not think he is a candidate for bypass surgery on the right leg 7/20; patient still having a lot of  pain in the right foot. He saw Dr. Donzetta Matters on 7/16. He is not felt to be able to undergo a complex revascularization and I certainly agree with that. He was offered a right below-knee amputation but he  has not made that decision. I have repetitively suggested hospice care if he will not undergo an amputation he is not decided he wants to go that route either. He arrives in the clinic today accompanied by his son who is a Stage manager and his wife. I think the patient has tissue breakdown in the dorsal foot secondary to ischemia although his son brought up that some of the erythema could be a contact dermatitis as they were apparently layering a large area of pure call over the wound area. I was not aware of this not supposed to be doing it in this fashion. In any case the erythema look better than last week and I am quite convinced this is not cellulitis READMISSION 11/11/2019 Mr. Paula Libra is now an 87 year old man who we had in this clinic from [place] through July/21 largely as a result of ischemic wounds in his right foot. He was followed by vein and vascular [name]. His anatomy was not felt to be amenable to a revascularization. The patient had increasing ischemic pain but was very resistant to the notion of an amputation. Eventually he did go through with this on August 22 he will underwent an AKA by [name]. He went to rehabilitation. There he developed a small stage III lower sacral pressure ulcer and an area on his left lateral heel which is also probably a pressure ulcer. They have been using silver collagen on both wound areas. The patient states he is not in any unrelieved pain. He is in a wheelchair at home with a Roho cushion. He is able to do his own transfers. I think he is probably putting far too much pressure on these areas Past medical history is reviewed. He has a history of CHF, severe PAD, hypertension, coronary artery disease status post CABG, COPD Gold stage IV on O2, hypothyroidism and a recent problem with urinary retention he now has a Foley catheter. He has had problems with left leg swelling and adjustment of his diuretics he is followed by Dr. Daneen Schick. The  patient has severe PAD. On 06/30/2019 he had an angiogram on the remaining left side he had heavily diseased femoral artery. His superficial femoral artery was flush occluded with runoff in the profunda. He was felt his SFA was likely occluded and reconstitutes the popliteal artery via runoff via the peroneal and posterior tibial artery. As noted he is not in a lot of pain. His last noninvasive study was on 6/20. On the left side this was noncompressible ABI but a TBI of only 0.20 with dampened monophasic waveforms 11/25/2019. Patient has 2 wound areas 1 on the left lateral heel and a ulcer on his coccyx. These are probably all pressure related although he has significant PAD complicating any attempt to heal a wound on the left heel. We have been using silver collagen on the wound and the coccyx silver alginate on the heel. 11/30; miraculously the left lateral heel wound has healed. The area on his coccyx is not much better. We have been using Prisma He apparently went back to see [name]. His wife paraphrasing says that he had terrible blood flow in the left lower extremity but he is not a candidate for revascularization. Fortunately had enough blood flow to heal the  wound I will try to look at Dr. Darletta Moll note by the time these next year 12/14; the area on his left lateral heel remains closed ooThe area on his coccyx is about the same in terms of surface area and depth minimal debris however but it is still adherent. We have been using Iodoflex. ooThey stop me before I left the room to show me his right AKA stump. He tells me he is having a lot of pain in this area that is episodic it almost looks like there is bruising in this area but the bruising looks old I asked him about trauma but they were not aware of any. They see vascular tomorrow 12/28; the area on the coccyx looks absolutely no better. Completely lifeless surface of fibrinous debris and surrounding raised skin. We have been  using Iodoflex this is simply not working. ooHe had a fall when he got up forgetting that he was missing his right leg he has skin tears on the right humerus area just above the elbow and on the right forearm. 1/11; the area on the coccyx is about the same we have been using Santyl although the surface of this is no better. Small punched out wound. He also has skin tears on his arm which are drying up gradually. His wife had called urgently yesterday to be seen. Apparently he developed erythema and swelling of his left foot over the weekend and she was able to express a teaspoon of pus coming from the dorsal foot. She had called her podiatrist who prescribed Keflex however in response to advice from our office she took him to the emergency room. They apparently gave him IV antibiotics and discharged him on doxycycline. His white count was 14.5 with 92% neutrophils. He is not systemically unwell and does not really complain of a lot of pain Objective Constitutional Patient is hypertensive.. Pulse regular and within target range for patient.Marland Kitchen Respirations regular, non-labored and within target range.. Temperature is normal and within the target range for the patient.Marland Kitchen Appears in no distress. Vitals Time Taken: 3:00 PM, Height: 69 in, Temperature: 97.7 F, Pulse: 102 bpm, Respiratory Rate: 18 breaths/min, Blood Pressure: 149/72 mmHg. Cardiovascular Pedal pulses are absent in the left foot and left popliteal foot is cool.. There is pitting edema and erythema involving the dorsal foot extending just above the ankle.. General Notes: Wound exam oo Coccyx simply does not look any better at all. I used a #3 curette to remove debris from the surface. Still using Santyl. oo The excoriations on his left arm from skin tears are drying up that I think closing. oo She has epithelial loss in the left fourth and fifth toes however this does not look to be threatening Integumentary (Hair, Skin) Wound #15  status is Open. Original cause of wound was Pressure Injury. The wound is located on the Sacrum. The wound measures 0.5cm length x 0.3cm width x 0.2cm depth; 0.118cm^2 area and 0.024cm^3 volume. There is Fat Layer (Subcutaneous Tissue) exposed. There is no tunneling noted, however, there is undermining starting at 4:00 and ending at 11:00 with a maximum distance of 0.2cm. There is a medium amount of serous drainage noted. The wound margin is epibole. There is small (1-33%) pink, pale granulation within the wound bed. There is a medium (34-66%) amount of necrotic tissue within the wound bed including Adherent Slough. Wound #16 status is Open. Original cause of wound was Trauma. The wound is located on the Left,Proximal Upper Arm. The wound measures  9cm length x 0.3cm width x 0.1cm depth; 2.121cm^2 area and 0.212cm^3 volume. There is Fat Layer (Subcutaneous Tissue) exposed. There is no tunneling or undermining noted. There is a medium amount of serosanguineous drainage noted. The wound margin is distinct with the outline attached to the wound base. There is large (67-100%) red granulation within the wound bed. There is no necrotic tissue within the wound bed. Wound #17 status is Open. Original cause of wound was Trauma. The wound is located on the Left,Distal Upper Arm. The wound measures 0.4cm length x 0.4cm width x 0.1cm depth; 0.126cm^2 area and 0.013cm^3 volume. There is Fat Layer (Subcutaneous Tissue) exposed. There is no tunneling or undermining noted. There is a medium amount of serosanguineous drainage noted. The wound margin is distinct with the outline attached to the wound base. There is large (67-100%) red granulation within the wound bed. There is no necrotic tissue within the wound bed. Assessment Active Problems ICD-10 Pressure ulcer of sacral region, stage 3 Acquired absence of right leg above knee Contusion of left upper arm, subsequent encounter Cellulitis of left lower  limb Procedures Wound #15 Pre-procedure diagnosis of Wound #15 is a Pressure Ulcer located on the Sacrum . There was a Excisional Skin/Subcutaneous Tissue Debridement with a total area of 0.15 sq cm performed by Ricard Dillon., MD. With the following instrument(s): Curette to remove Viable and Non-Viable tissue/material. Material removed includes Subcutaneous Tissue and Skin: Dermis and after achieving pain control using Lidocaine. No specimens were taken. A time out was conducted at 15:45, prior to the start of the procedure. A Minimum amount of bleeding was controlled with Pressure. The procedure was tolerated well with a pain level of 0 throughout and a pain level of 0 following the procedure. Post Debridement Measurements: 0.5cm length x 0.3cm width x 0.2cm depth; 0.024cm^3 volume. Post debridement Stage noted as Category/Stage III. Character of Wound/Ulcer Post Debridement is improved. Post procedure Diagnosis Wound #15: Same as Pre-Procedure Plan Follow-up Appointments: Return Appointment in 2 weeks. Bathing/ Shower/ Hygiene: May shower and wash wound with soap and water. - with dressing changes only. Off-Loading: Turn and reposition every 2 hours Home Health: New wound care orders this week; continue Home Health for wound care. May utilize formulary equivalent dressing for wound treatment orders unless otherwise specified. Other Home Health Orders/Instructions: - [facility] WOUND #15: - Sacrum Wound Laterality: Cleanser: Wound Cleanser (Home Health) 1 x Per Day/30 Days Discharge Instructions: Cleanse the wound with wound cleanser prior to applying a clean dressing using gauze sponges, not tissue or cotton balls. Peri-Wound Care: Skin Prep (Home Health) 1 x Per Day/30 Days Discharge Instructions: Use skin prep as directed Prim Dressing: Santyl Ointment ([place]) 1 x Per Day/30 Days ary Discharge Instructions: Apply nickel thick amount to wound bed as instructed Secondary  Dressing: ComfortFoam Border, 3x3 in (silicone border) ([place]) 1 x Per Day/30 Days Discharge Instructions: Apply over primary dressing as directed. WOUND #16: - Upper Arm Wound Laterality: Left, Proximal Cleanser: Normal Saline 2 x Per Week/30 Days Discharge Instructions: Cleanse the wound with Normal Saline prior to applying a clean dressing using gauze sponges, not tissue or cotton balls. Prim Dressing: Xeroform Occlusive Gauze Dressing, 4x4 in (Home Health) 2 x Per Week/30 Days ary Discharge Instructions: Apply to wound bed as instructed Secondary Dressing: Woven Gauze Sponge, Non-Sterile 4x4 in (Home Health) 2 x Per Week/30 Days Discharge Instructions: Apply over primary dressing as directed. Secured With: [employer], 4.5x3.1 (in/yd) (Home Health) 2  x Per Week/30 Days Discharge Instructions: Secure with Kerlix as directed. Secured With: Paper T ape, 2x10 (in/yd) (Home Health) 2 x Per Week/30 Days Discharge Instructions: Secure dressing with tape as directed. WOUND #17: - Upper Arm Wound Laterality: Left, Distal Cleanser: Normal Saline 2 x Per Week/30 Days Discharge Instructions: Cleanse the wound with Normal Saline prior to applying a clean dressing using gauze sponges, not tissue or cotton balls. Prim Dressing: Xeroform Occlusive Gauze Dressing, 4x4 in (Home Health) 2 x Per Week/30 Days ary Discharge Instructions: Apply to wound bed as instructed Secondary Dressing: Woven Gauze Sponge, Non-Sterile 4x4 in (Home Health) 2 x Per Week/30 Days Discharge Instructions: Apply over primary dressing as directed. Secured With: [employer], 4.5x3.1 (in/yd) (Home Health) 2 x Per Week/30 Days Discharge Instructions: Secure with Kerlix as directed. Secured With: Paper T ape, 2x10 (in/yd) (Home Health) 2 x Per Week/30 Days Discharge Instructions: Secure dressing with tape as directed. 1. I continued with Santyl to the coccyx. The plan would be to clean up the wound surface and  then perhaps use Oasis 2. I continued with Xeroform and gauze to the left arm 3. Erythema and swelling of the left foot. The findings are a bit atypical however the elevated white count with left shift would suggest this is cellulitis and I recommended continuing the doxycycline there is no open wound here. I wondered whether he could be developing a DVT as the major differential. 4. I asked him to follow-up with his primary doctor about this he probably should be seen either later this week or early next week Electronic Signature(s) Signed: 01/20/2020 5:07:05 PM By: [name] MD Entered By: [name] on 01/20/2020 16:29:28 -------------------------------------------------------------------------------- SuperBill Details Patient Name: Date of Service: DO RN, [name] 01/20/2020 Medical Record Number: [id number] Patient Account Number: [id number] Date of Birth/Sex: Treating RN: [birth year] (87 y.o. [name], [name] Primary Care Provider: [name] Other Clinician: Referring Provider: Treating Provider/Extender: [name] in Treatment: 10 Diagnosis Coding ICD-10 Codes Code Description [phone] Pressure ulcer of sacral region, stage 3 Z89.611 Acquired absence of right leg above knee S40.022D Contusion of left upper arm, subsequent encounter Facility Procedures CPT4 Code: [id number] Description: [id number] - DEB SUBQ TISSUE 20 SQ CM/< ICD-10 Diagnosis Description L89.153 Pressure ulcer of sacral region, stage 3 Modifier: Quantity: 1 Physician Procedures : CPT4 Code Description Modifier [id number] - WC PHYS SUBQ TISS 20 SQ CM ICD-10 Diagnosis Description L89.153 Pressure ulcer of sacral region, stage 3 Quantity: 1 Electronic Signature(s) Signed: 01/20/2020 5:07:05 PM By: [name] MD Entered By: [name] on 01/20/2020 16:29:49

## 2020-01-20 NOTE — Telephone Encounter (Signed)
His allergy is diarrhea and urinary freq if I recall.  He has a foley in and the "allergy" is not to Keflex specifically.  He should try it.  Further, he is supposed to follow up with would clinic today.

## 2020-01-20 NOTE — ED Notes (Signed)
Wife is asking for dressing for pt's foot provider made aware.

## 2020-01-20 NOTE — Discharge Instructions (Signed)
Return for rapid spreading redness or fever.  Follow-up with the vascular surgeon in the wound care center tomorrow.

## 2020-01-21 ENCOUNTER — Other Ambulatory Visit: Payer: Self-pay | Admitting: *Deleted

## 2020-01-21 DIAGNOSIS — I480 Paroxysmal atrial fibrillation: Secondary | ICD-10-CM | POA: Diagnosis not present

## 2020-01-21 DIAGNOSIS — I739 Peripheral vascular disease, unspecified: Secondary | ICD-10-CM | POA: Diagnosis not present

## 2020-01-21 DIAGNOSIS — I110 Hypertensive heart disease with heart failure: Secondary | ICD-10-CM | POA: Diagnosis not present

## 2020-01-21 DIAGNOSIS — J9611 Chronic respiratory failure with hypoxia: Secondary | ICD-10-CM | POA: Diagnosis not present

## 2020-01-21 DIAGNOSIS — L89152 Pressure ulcer of sacral region, stage 2: Secondary | ICD-10-CM | POA: Diagnosis not present

## 2020-01-21 DIAGNOSIS — I5032 Chronic diastolic (congestive) heart failure: Secondary | ICD-10-CM | POA: Diagnosis not present

## 2020-01-21 NOTE — Patient Outreach (Signed)
[facility]) Care Management  [place]  01/21/2020   [name] 1929-12-06 [id number]   Notified that member's wife called triage line concerning left leg infection, however call wasn't complete [name] to wife hanging up stating she would wait until the nurse from the MD office called back.    Call placed to wife, immediately begin to express concern over events of the last several days.  Member has breakdown on left foot now, showing signs of infection. Was seen in the ED, had video visit with physical medicine and rehab MD, as well as office visit with wound care provider.  He has been placed on Doxycycline for treatment.  She voices understanding that vascular provider would be best for follow up but feels there may be no need.  She and member have had discussion and decided if a wound formed on the left leg they would transition to hospice as they are not willing to undergo a second amputation.  Member is already active with [facility], offered to contact to help with transition to hospice. She declines, state she will reach out to them herself.  Denies any urgent concerns, encouraged to contact this care manager with questions.    Encounter Medications:  Outpatient Encounter Medications as of 01/21/2020  Medication Sig  . ALPRAZolam (XANAX) 0.5 MG tablet Take 1 tablet (0.5 mg total) by mouth 3 (three) times daily.  Marland Kitchen aspirin 81 MG tablet Take 81 mg by mouth daily.  Marland Kitchen atorvastatin (LIPITOR) 20 MG tablet TAKE 1 TABLET BY MOUTH EVERY DAY (Patient taking differently: Take 20 mg by mouth at bedtime.)  . calcium carbonate (TUMS - DOSED IN MG ELEMENTAL CALCIUM) 500 MG chewable tablet Chew 1 tablet (200 mg of elemental calcium total) by mouth 2 (two) times daily as needed for indigestion or heartburn.  . cephALEXin (KEFLEX) 500 MG capsule Take 1 capsule (500 mg total) by mouth 4 (four) times daily.  . Cholecalciferol (VITAMIN D3) 2000 units TABS Take 2,000  Units by mouth daily with lunch.   . doxycycline (VIBRAMYCIN) 100 MG capsule Take 1 capsule (100 mg total) by mouth 2 (two) times daily. One po bid x 7 days  . famotidine (PEPCID) 20 MG tablet Take 20 mg by mouth at bedtime.   . finasteride (PROSCAR) 5 MG tablet Take 5 mg by mouth daily with lunch.   . fluticasone (FLONASE) 50 MCG/ACT nasal spray Place 2 sprays into both nostrils daily.  . furosemide (LASIX) 20 MG tablet Take two tablets by mouth every morning.  Take one tablet by mouth every evening.  . levalbuterol (XOPENEX HFA) 45 MCG/ACT inhaler Inhale 2 puffs into the lungs every 4 (four) hours as needed for wheezing or shortness of breath.  . levalbuterol (XOPENEX) 0.63 MG/3ML nebulizer solution Take 3 mLs (0.63 mg total) by nebulization every 4 (four) hours as needed for wheezing or shortness of breath.  . levothyroxine (SYNTHROID) 50 MCG tablet Take 50 mcg by mouth daily.  . metoprolol succinate (TOPROL-XL) 25 MG 24 hr tablet Take 0.5 tablets (12.5 mg total) by mouth daily.  . mometasone (ELOCON) 0.1 % cream Apply topically daily as needed.  . Multiple Vitamins-Minerals (PRESERVISION AREDS 2) CAPS Take 1 capsule by mouth daily.  Marland Kitchen MYRBETRIQ 25 MG TB24 tablet Take 25 mg by mouth daily.  Marland Kitchen NITROSTAT 0.4 MG SL tablet Place 0.4 mg under the tongue every 5 (five) minutes as needed for chest pain.  . OXYGEN Inhale 2 L/min into  the lungs continuous.   . pantoprazole (PROTONIX) 40 MG tablet TAKE 1 TABLET(40 MG) BY MOUTH DAILY 30 TO 60 MINUTES BEFORE FIRST MEAL OF THE DAY  . Polyethyl Glycol-Propyl Glycol (SYSTANE) 0.4-0.3 % SOLN Apply 1 drop to eye 2 (two) times daily as needed (eye irritation).  . polyethylene glycol (MIRALAX / GLYCOLAX) 17 g packet Take 17 g by mouth daily.  . potassium chloride 20 MEQ/15ML (10%) SOLN Take 20 mEq by mouth daily.  . predniSONE (DELTASONE) 10 MG tablet FOR FLARE OF COUGH/WHEEZING MAY INCREASE TO 2 DAILY BETTER, THAN 1 TABLET BY MOUTH EVERY DAY  . sodium chloride  (MURO 128) 5 % ophthalmic solution Place 1 drop into both eyes 4 (four) times daily.  Marland Kitchen spironolactone (ALDACTONE) 25 MG tablet Take 25 mg by mouth daily.  . SYMBICORT 160-4.5 MCG/ACT inhaler Inhale 2 puffs into the lungs 2 (two) times daily.  . Tiotropium Bromide Monohydrate (SPIRIVA RESPIMAT) 2.5 MCG/ACT AERS INHALE 2 PUFFS BY MOUTH EVERY DAY (Patient taking differently: Inhale 2 puffs into the lungs daily.)  . vitamin B-12 (CYANOCOBALAMIN) 1000 MCG tablet Take 1,000 mcg by mouth daily.   . Zinc Oxide (TRIPLE PASTE) 12.8 % ointment Apply topically as needed for irritation. To gluteal cleft/buttocks   No facility-administered encounter medications on file as of 01/21/2020.    Functional Status:  In your present state of health, do you have any difficulty performing the following activities: 01/01/2020 11/20/2019  Hearing? Y N  Vision? N N  Difficulty concentrating or making decisions? N N  Walking or climbing stairs? N Y  Comment - Chronic Leg Pain  Dressing or bathing? N N  Doing errands, shopping? N N  Preparing Food and eating ? N N  Using the Toilet? N N  In the past six months, have you accidently leaked urine? N N  Do you have problems with loss of bowel control? N N  Managing your Medications? N N  Managing your Finances? N N  Housekeeping or managing your Housekeeping? N N  Some recent data might be hidden    Fall/Depression Screening: Fall Risk  01/19/2020 01/01/2020 01/01/2020  Falls in the past year? 1 1 1   Comment 01/04/2020 upper arm lacerations - -  Number falls in past yr: 1 1 1   Injury with Fall? 1 1 1   Risk Factor Category  - - -  Risk for fall [name] to : - History of fall(s);Impaired balance/gait;Impaired mobility;Mental status change -  Follow up - Education provided;Falls prevention discussed -   PHQ 2/9 Scores 01/19/2020 01/01/2020 12/16/2019 11/20/2019 11/20/2019 10/20/2019 06/02/2019  PHQ - 2 Score 2 2 2 1 1 4  -  PHQ- 9 Score - 10 - - - 14 -  Exception  Documentation - - - - - - Other- indicate reason in comment box    Assessment:  Goals Addressed            This Visit's Progress   . [facility] - Learn More About My Health   On track    Follow Up Date 01/23/2020  Timeframe:  Long-Range Goal Priority:  Medium Start Date:            12/29/2019                 Expected End Date:    02/29/2020                     - make a list of questions - ask questions -  speak up when I don't understand    Why is this important?   The best way to learn about your health and care is by talking to the doctor and nurse.  They will answer your questions and give you information in the way that you like best.    Notes:  Will report understanding of progression and recovery from amputation  12/20 - Referrals placed for CSW and Remote Health to encouraged support in the home and counseling    . [facility] - Make and Keep All Appointments   On track    Follow Up Date 01/23/2020  Timeframe:  Short-Term Goal Priority:  High Start Date:       12/29/2019                      Expected End Date:    01/29/2020                     - call to cancel if needed - keep a calendar with appointment dates    Why is this important?   Part of staying healthy is seeing the doctor for follow-up care.  If you forget your appointments, there are some things you can do to stay on track.    Notes:   11/11 - Referral to CSW for transportation resources  12/20 - Reviewed upcoming appointments with wife       Plan:  Follow-up:  Patient agrees to Care Plan and Follow-up.  Will follow up within the next 2 weeks regarding start of hospice.  Valente Zaven, [place], MSN [place] [phone]

## 2020-01-22 DIAGNOSIS — S78111D Complete traumatic amputation at level between right hip and knee, subsequent encounter: Secondary | ICD-10-CM | POA: Diagnosis not present

## 2020-01-22 DIAGNOSIS — L03116 Cellulitis of left lower limb: Secondary | ICD-10-CM | POA: Diagnosis not present

## 2020-01-22 DIAGNOSIS — I739 Peripheral vascular disease, unspecified: Secondary | ICD-10-CM | POA: Diagnosis not present

## 2020-01-23 ENCOUNTER — Other Ambulatory Visit: Payer: Medicare Other | Admitting: *Deleted

## 2020-01-23 ENCOUNTER — Telehealth: Payer: Self-pay | Admitting: *Deleted

## 2020-01-23 ENCOUNTER — Inpatient Hospital Stay (HOSPITAL_COMMUNITY)
Admission: EM | Admit: 2020-01-23 | Discharge: 2020-02-05 | DRG: 475 | Disposition: A | Discharge status: Rehabilitation Facility | Payer: Medicare Other | Attending: Internal Medicine | Admitting: Internal Medicine

## 2020-01-23 ENCOUNTER — Ambulatory Visit: Payer: Self-pay | Admitting: *Deleted

## 2020-01-23 ENCOUNTER — Other Ambulatory Visit: Payer: Self-pay

## 2020-01-23 ENCOUNTER — Emergency Department (HOSPITAL_COMMUNITY): Payer: Medicare Other

## 2020-01-23 ENCOUNTER — Encounter (HOSPITAL_COMMUNITY): Payer: Self-pay | Admitting: *Deleted

## 2020-01-23 DIAGNOSIS — Z89511 Acquired absence of right leg below knee: Secondary | ICD-10-CM | POA: Diagnosis not present

## 2020-01-23 DIAGNOSIS — M869 Osteomyelitis, unspecified: Secondary | ICD-10-CM | POA: Diagnosis not present

## 2020-01-23 DIAGNOSIS — E039 Hypothyroidism, unspecified: Secondary | ICD-10-CM | POA: Diagnosis present

## 2020-01-23 DIAGNOSIS — Z7952 Long term (current) use of systemic steroids: Secondary | ICD-10-CM

## 2020-01-23 DIAGNOSIS — I4892 Unspecified atrial flutter: Secondary | ICD-10-CM | POA: Diagnosis not present

## 2020-01-23 DIAGNOSIS — M7989 Other specified soft tissue disorders: Secondary | ICD-10-CM | POA: Diagnosis not present

## 2020-01-23 DIAGNOSIS — Z515 Encounter for palliative care: Secondary | ICD-10-CM | POA: Diagnosis not present

## 2020-01-23 DIAGNOSIS — L03116 Cellulitis of left lower limb: Secondary | ICD-10-CM | POA: Diagnosis not present

## 2020-01-23 DIAGNOSIS — L02416 Cutaneous abscess of left lower limb: Secondary | ICD-10-CM | POA: Diagnosis present

## 2020-01-23 DIAGNOSIS — Z91048 Other nonmedicinal substance allergy status: Secondary | ICD-10-CM

## 2020-01-23 DIAGNOSIS — Y738 Miscellaneous gastroenterology and urology devices associated with adverse incidents, not elsewhere classified: Secondary | ICD-10-CM | POA: Diagnosis not present

## 2020-01-23 DIAGNOSIS — Z79899 Other long term (current) drug therapy: Secondary | ICD-10-CM

## 2020-01-23 DIAGNOSIS — Z8249 Family history of ischemic heart disease and other diseases of the circulatory system: Secondary | ICD-10-CM

## 2020-01-23 DIAGNOSIS — Z9981 Dependence on supplemental oxygen: Secondary | ICD-10-CM

## 2020-01-23 DIAGNOSIS — Z20822 Contact with and (suspected) exposure to covid-19: Secondary | ICD-10-CM | POA: Diagnosis not present

## 2020-01-23 DIAGNOSIS — I110 Hypertensive heart disease with heart failure: Secondary | ICD-10-CM | POA: Diagnosis present

## 2020-01-23 DIAGNOSIS — Z83438 Family history of other disorder of lipoprotein metabolism and other lipidemia: Secondary | ICD-10-CM

## 2020-01-23 DIAGNOSIS — E440 Moderate protein-calorie malnutrition: Secondary | ICD-10-CM | POA: Insufficient documentation

## 2020-01-23 DIAGNOSIS — K5900 Constipation, unspecified: Secondary | ICD-10-CM | POA: Diagnosis not present

## 2020-01-23 DIAGNOSIS — Z882 Allergy status to sulfonamides status: Secondary | ICD-10-CM

## 2020-01-23 DIAGNOSIS — Y835 Amputation of limb(s) as the cause of abnormal reaction of the patient, or of later complication, without mention of misadventure at the time of the procedure: Secondary | ICD-10-CM | POA: Diagnosis present

## 2020-01-23 DIAGNOSIS — G4733 Obstructive sleep apnea (adult) (pediatric): Secondary | ICD-10-CM | POA: Diagnosis present

## 2020-01-23 DIAGNOSIS — Z66 Do not resuscitate: Secondary | ICD-10-CM | POA: Diagnosis not present

## 2020-01-23 DIAGNOSIS — D649 Anemia, unspecified: Secondary | ICD-10-CM

## 2020-01-23 DIAGNOSIS — T83031A Leakage of indwelling urethral catheter, initial encounter: Secondary | ICD-10-CM | POA: Diagnosis not present

## 2020-01-23 DIAGNOSIS — Z951 Presence of aortocoronary bypass graft: Secondary | ICD-10-CM

## 2020-01-23 DIAGNOSIS — Z681 Body mass index (BMI) 19 or less, adult: Secondary | ICD-10-CM

## 2020-01-23 DIAGNOSIS — R319 Hematuria, unspecified: Secondary | ICD-10-CM | POA: Diagnosis not present

## 2020-01-23 DIAGNOSIS — J9611 Chronic respiratory failure with hypoxia: Secondary | ICD-10-CM | POA: Diagnosis not present

## 2020-01-23 DIAGNOSIS — R0981 Nasal congestion: Secondary | ICD-10-CM | POA: Diagnosis not present

## 2020-01-23 DIAGNOSIS — N179 Acute kidney failure, unspecified: Secondary | ICD-10-CM | POA: Diagnosis not present

## 2020-01-23 DIAGNOSIS — E785 Hyperlipidemia, unspecified: Secondary | ICD-10-CM | POA: Diagnosis present

## 2020-01-23 DIAGNOSIS — I483 Typical atrial flutter: Secondary | ICD-10-CM

## 2020-01-23 DIAGNOSIS — Z880 Allergy status to penicillin: Secondary | ICD-10-CM

## 2020-01-23 DIAGNOSIS — Z79890 Hormone replacement therapy (postmenopausal): Secondary | ICD-10-CM

## 2020-01-23 DIAGNOSIS — T8789 Other complications of amputation stump: Secondary | ICD-10-CM | POA: Diagnosis not present

## 2020-01-23 DIAGNOSIS — K219 Gastro-esophageal reflux disease without esophagitis: Secondary | ICD-10-CM | POA: Diagnosis present

## 2020-01-23 DIAGNOSIS — Z87891 Personal history of nicotine dependence: Secondary | ICD-10-CM

## 2020-01-23 DIAGNOSIS — J449 Chronic obstructive pulmonary disease, unspecified: Secondary | ICD-10-CM | POA: Diagnosis present

## 2020-01-23 DIAGNOSIS — J9612 Chronic respiratory failure with hypercapnia: Secondary | ICD-10-CM | POA: Diagnosis present

## 2020-01-23 DIAGNOSIS — N400 Enlarged prostate without lower urinary tract symptoms: Secondary | ICD-10-CM | POA: Diagnosis present

## 2020-01-23 DIAGNOSIS — E871 Hypo-osmolality and hyponatremia: Secondary | ICD-10-CM | POA: Diagnosis not present

## 2020-01-23 DIAGNOSIS — Z888 Allergy status to other drugs, medicaments and biological substances status: Secondary | ICD-10-CM

## 2020-01-23 DIAGNOSIS — Z881 Allergy status to other antibiotic agents status: Secondary | ICD-10-CM

## 2020-01-23 DIAGNOSIS — L89152 Pressure ulcer of sacral region, stage 2: Secondary | ICD-10-CM | POA: Diagnosis present

## 2020-01-23 DIAGNOSIS — I739 Peripheral vascular disease, unspecified: Secondary | ICD-10-CM | POA: Diagnosis present

## 2020-01-23 DIAGNOSIS — I4891 Unspecified atrial fibrillation: Secondary | ICD-10-CM

## 2020-01-23 DIAGNOSIS — L89159 Pressure ulcer of sacral region, unspecified stage: Secondary | ICD-10-CM

## 2020-01-23 DIAGNOSIS — I25119 Atherosclerotic heart disease of native coronary artery with unspecified angina pectoris: Secondary | ICD-10-CM | POA: Diagnosis present

## 2020-01-23 DIAGNOSIS — Z7982 Long term (current) use of aspirin: Secondary | ICD-10-CM

## 2020-01-23 DIAGNOSIS — I5032 Chronic diastolic (congestive) heart failure: Secondary | ICD-10-CM | POA: Diagnosis present

## 2020-01-23 DIAGNOSIS — I959 Hypotension, unspecified: Secondary | ICD-10-CM | POA: Diagnosis not present

## 2020-01-23 DIAGNOSIS — Z85828 Personal history of other malignant neoplasm of skin: Secondary | ICD-10-CM

## 2020-01-23 DIAGNOSIS — L97119 Non-pressure chronic ulcer of right thigh with unspecified severity: Secondary | ICD-10-CM

## 2020-01-23 DIAGNOSIS — H9190 Unspecified hearing loss, unspecified ear: Secondary | ICD-10-CM | POA: Diagnosis present

## 2020-01-23 DIAGNOSIS — D62 Acute posthemorrhagic anemia: Secondary | ICD-10-CM | POA: Diagnosis not present

## 2020-01-23 DIAGNOSIS — I472 Ventricular tachycardia: Secondary | ICD-10-CM | POA: Diagnosis not present

## 2020-01-23 DIAGNOSIS — R059 Cough, unspecified: Secondary | ICD-10-CM

## 2020-01-23 DIAGNOSIS — Z7951 Long term (current) use of inhaled steroids: Secondary | ICD-10-CM

## 2020-01-23 LAB — COMPREHENSIVE METABOLIC PANEL
ALT: 46 U/L — ABNORMAL HIGH (ref 0–44)
AST: 29 U/L (ref 15–41)
Albumin: 3.9 g/dL (ref 3.5–5.0)
Alkaline Phosphatase: 61 U/L (ref 38–126)
Anion gap: 11 (ref 5–15)
BUN: 32 mg/dL — ABNORMAL HIGH (ref 8–23)
CO2: 32 mmol/L (ref 22–32)
Calcium: 9.4 mg/dL (ref 8.9–10.3)
Chloride: 89 mmol/L — ABNORMAL LOW (ref 98–111)
Creatinine, Ser: 1.46 mg/dL — ABNORMAL HIGH (ref 0.61–1.24)
GFR, Estimated: 46 mL/min — ABNORMAL LOW (ref >60)
Glucose, Bld: 130 mg/dL — ABNORMAL HIGH (ref 70–99)
Potassium: 4 mmol/L (ref 3.5–5.1)
Sodium: 132 mmol/L — ABNORMAL LOW (ref 135–145)
Total Bilirubin: 0.9 mg/dL (ref 0.3–1.2)
Total Protein: 6.3 g/dL — ABNORMAL LOW (ref 6.5–8.1)

## 2020-01-23 LAB — CBC WITH DIFFERENTIAL/PLATELET
Abs Immature Granulocytes: 0.21 10*3/uL — ABNORMAL HIGH (ref 0.00–0.07)
Basophils Absolute: 0.1 10*3/uL (ref 0.0–0.1)
Basophils Relative: 0 %
Eosinophils Absolute: 0 10*3/uL (ref 0.0–0.5)
Eosinophils Relative: 0 %
HCT: 36 % — ABNORMAL LOW (ref 39.0–52.0)
Hemoglobin: 12.2 g/dL — ABNORMAL LOW (ref 13.0–17.0)
Immature Granulocytes: 2 %
Lymphocytes Relative: 2 %
Lymphs Abs: 0.3 10*3/uL — ABNORMAL LOW (ref 0.7–4.0)
MCH: 32.2 pg (ref 26.0–34.0)
MCHC: 33.9 g/dL (ref 30.0–36.0)
MCV: 95 fL (ref 80.0–100.0)
Monocytes Absolute: 0.4 10*3/uL (ref 0.1–1.0)
Monocytes Relative: 3 %
Neutro Abs: 11.1 10*3/uL — ABNORMAL HIGH (ref 1.7–7.7)
Neutrophils Relative %: 93 %
Platelets: 266 10*3/uL (ref 150–400)
RBC: 3.79 MIL/uL — ABNORMAL LOW (ref 4.22–5.81)
RDW: 13.1 % (ref 11.5–15.5)
WBC: 12 10*3/uL — ABNORMAL HIGH (ref 4.0–10.5)
nRBC: 0 % (ref 0.0–0.2)

## 2020-01-23 LAB — LACTIC ACID, PLASMA: Lactic Acid, Venous: 1.6 mmol/L (ref 0.5–1.9)

## 2020-01-23 NOTE — ED Triage Notes (Addendum)
Pt brought in by wife who reports that the patients doctor wanted him sent here for wound evaluation on the RAKA and for increased swelling and drainage to the foot/toes on the left foot. Small wound noted on right stump area and swelling/redness/decreased cap refill to the left foot. Pt has been on doxycyline and has received IV Rocephin for the same by his provider. Pt is on 3 liters chronic.

## 2020-01-23 NOTE — Telephone Encounter (Signed)
Wife states that patient has an opening to his amputation site and toes from the existing leg are very edematous and draining. Patient has been given doxycycline and is receiving rocephin. Patient's wife states that due to going to a provider for rocephin he could not come to [facility] for office visit.

## 2020-01-24 ENCOUNTER — Inpatient Hospital Stay (HOSPITAL_COMMUNITY): Payer: Medicare Other

## 2020-01-24 DIAGNOSIS — E039 Hypothyroidism, unspecified: Secondary | ICD-10-CM | POA: Diagnosis not present

## 2020-01-24 DIAGNOSIS — J9612 Chronic respiratory failure with hypercapnia: Secondary | ICD-10-CM | POA: Diagnosis not present

## 2020-01-24 DIAGNOSIS — F411 Generalized anxiety disorder: Secondary | ICD-10-CM | POA: Diagnosis not present

## 2020-01-24 DIAGNOSIS — L89152 Pressure ulcer of sacral region, stage 2: Secondary | ICD-10-CM | POA: Diagnosis not present

## 2020-01-24 DIAGNOSIS — H9190 Unspecified hearing loss, unspecified ear: Secondary | ICD-10-CM | POA: Diagnosis present

## 2020-01-24 DIAGNOSIS — I5032 Chronic diastolic (congestive) heart failure: Secondary | ICD-10-CM | POA: Diagnosis not present

## 2020-01-24 DIAGNOSIS — I480 Paroxysmal atrial fibrillation: Secondary | ICD-10-CM | POA: Diagnosis not present

## 2020-01-24 DIAGNOSIS — K5900 Constipation, unspecified: Secondary | ICD-10-CM | POA: Diagnosis not present

## 2020-01-24 DIAGNOSIS — N401 Enlarged prostate with lower urinary tract symptoms: Secondary | ICD-10-CM | POA: Diagnosis present

## 2020-01-24 DIAGNOSIS — M19072 Primary osteoarthritis, left ankle and foot: Secondary | ICD-10-CM | POA: Diagnosis not present

## 2020-01-24 DIAGNOSIS — E46 Unspecified protein-calorie malnutrition: Secondary | ICD-10-CM | POA: Diagnosis not present

## 2020-01-24 DIAGNOSIS — Y738 Miscellaneous gastroenterology and urology devices associated with adverse incidents, not elsewhere classified: Secondary | ICD-10-CM | POA: Diagnosis not present

## 2020-01-24 DIAGNOSIS — I25708 Atherosclerosis of coronary artery bypass graft(s), unspecified, with other forms of angina pectoris: Secondary | ICD-10-CM | POA: Diagnosis not present

## 2020-01-24 DIAGNOSIS — Z681 Body mass index (BMI) 19 or less, adult: Secondary | ICD-10-CM | POA: Diagnosis not present

## 2020-01-24 DIAGNOSIS — G8918 Other acute postprocedural pain: Secondary | ICD-10-CM | POA: Diagnosis not present

## 2020-01-24 DIAGNOSIS — Z515 Encounter for palliative care: Secondary | ICD-10-CM | POA: Diagnosis not present

## 2020-01-24 DIAGNOSIS — I5042 Chronic combined systolic (congestive) and diastolic (congestive) heart failure: Secondary | ICD-10-CM | POA: Diagnosis not present

## 2020-01-24 DIAGNOSIS — I4892 Unspecified atrial flutter: Secondary | ICD-10-CM | POA: Diagnosis not present

## 2020-01-24 DIAGNOSIS — R339 Retention of urine, unspecified: Secondary | ICD-10-CM | POA: Diagnosis not present

## 2020-01-24 DIAGNOSIS — N179 Acute kidney failure, unspecified: Secondary | ICD-10-CM | POA: Diagnosis not present

## 2020-01-24 DIAGNOSIS — Z20822 Contact with and (suspected) exposure to covid-19: Secondary | ICD-10-CM | POA: Diagnosis not present

## 2020-01-24 DIAGNOSIS — R262 Difficulty in walking, not elsewhere classified: Secondary | ICD-10-CM | POA: Diagnosis not present

## 2020-01-24 DIAGNOSIS — R338 Other retention of urine: Secondary | ICD-10-CM | POA: Diagnosis present

## 2020-01-24 DIAGNOSIS — L02416 Cutaneous abscess of left lower limb: Secondary | ICD-10-CM | POA: Diagnosis present

## 2020-01-24 DIAGNOSIS — I87322 Chronic venous hypertension (idiopathic) with inflammation of left lower extremity: Secondary | ICD-10-CM | POA: Diagnosis not present

## 2020-01-24 DIAGNOSIS — M159 Polyosteoarthritis, unspecified: Secondary | ICD-10-CM | POA: Diagnosis present

## 2020-01-24 DIAGNOSIS — J9611 Chronic respiratory failure with hypoxia: Secondary | ICD-10-CM | POA: Diagnosis not present

## 2020-01-24 DIAGNOSIS — I472 Ventricular tachycardia: Secondary | ICD-10-CM | POA: Diagnosis not present

## 2020-01-24 DIAGNOSIS — R059 Cough, unspecified: Secondary | ICD-10-CM | POA: Diagnosis not present

## 2020-01-24 DIAGNOSIS — F329 Major depressive disorder, single episode, unspecified: Secondary | ICD-10-CM | POA: Diagnosis not present

## 2020-01-24 DIAGNOSIS — I509 Heart failure, unspecified: Secondary | ICD-10-CM | POA: Diagnosis not present

## 2020-01-24 DIAGNOSIS — J449 Chronic obstructive pulmonary disease, unspecified: Secondary | ICD-10-CM | POA: Diagnosis not present

## 2020-01-24 DIAGNOSIS — F324 Major depressive disorder, single episode, in partial remission: Secondary | ICD-10-CM | POA: Diagnosis not present

## 2020-01-24 DIAGNOSIS — I2510 Atherosclerotic heart disease of native coronary artery without angina pectoris: Secondary | ICD-10-CM | POA: Diagnosis not present

## 2020-01-24 DIAGNOSIS — N400 Enlarged prostate without lower urinary tract symptoms: Secondary | ICD-10-CM | POA: Diagnosis not present

## 2020-01-24 DIAGNOSIS — R079 Chest pain, unspecified: Secondary | ICD-10-CM | POA: Diagnosis not present

## 2020-01-24 DIAGNOSIS — I10 Essential (primary) hypertension: Secondary | ICD-10-CM | POA: Diagnosis not present

## 2020-01-24 DIAGNOSIS — N139 Obstructive and reflux uropathy, unspecified: Secondary | ICD-10-CM | POA: Diagnosis not present

## 2020-01-24 DIAGNOSIS — E871 Hypo-osmolality and hyponatremia: Secondary | ICD-10-CM | POA: Diagnosis not present

## 2020-01-24 DIAGNOSIS — Z66 Do not resuscitate: Secondary | ICD-10-CM | POA: Diagnosis not present

## 2020-01-24 DIAGNOSIS — G4733 Obstructive sleep apnea (adult) (pediatric): Secondary | ICD-10-CM | POA: Diagnosis present

## 2020-01-24 DIAGNOSIS — T8781 Dehiscence of amputation stump: Secondary | ICD-10-CM | POA: Diagnosis not present

## 2020-01-24 DIAGNOSIS — Y835 Amputation of limb(s) as the cause of abnormal reaction of the patient, or of later complication, without mention of misadventure at the time of the procedure: Secondary | ICD-10-CM | POA: Diagnosis present

## 2020-01-24 DIAGNOSIS — K219 Gastro-esophageal reflux disease without esophagitis: Secondary | ICD-10-CM | POA: Diagnosis not present

## 2020-01-24 DIAGNOSIS — Z881 Allergy status to other antibiotic agents status: Secondary | ICD-10-CM | POA: Diagnosis not present

## 2020-01-24 DIAGNOSIS — L89892 Pressure ulcer of other site, stage 2: Secondary | ICD-10-CM | POA: Diagnosis present

## 2020-01-24 DIAGNOSIS — Z89611 Acquired absence of right leg above knee: Secondary | ICD-10-CM | POA: Diagnosis not present

## 2020-01-24 DIAGNOSIS — F332 Major depressive disorder, recurrent severe without psychotic features: Secondary | ICD-10-CM | POA: Diagnosis not present

## 2020-01-24 DIAGNOSIS — Z466 Encounter for fitting and adjustment of urinary device: Secondary | ICD-10-CM | POA: Diagnosis not present

## 2020-01-24 DIAGNOSIS — R5381 Other malaise: Secondary | ICD-10-CM | POA: Diagnosis not present

## 2020-01-24 DIAGNOSIS — Z7189 Other specified counseling: Secondary | ICD-10-CM | POA: Diagnosis not present

## 2020-01-24 DIAGNOSIS — E440 Moderate protein-calorie malnutrition: Secondary | ICD-10-CM | POA: Diagnosis present

## 2020-01-24 DIAGNOSIS — T8789 Other complications of amputation stump: Secondary | ICD-10-CM | POA: Diagnosis not present

## 2020-01-24 DIAGNOSIS — L03116 Cellulitis of left lower limb: Secondary | ICD-10-CM | POA: Diagnosis present

## 2020-01-24 DIAGNOSIS — Z9981 Dependence on supplemental oxygen: Secondary | ICD-10-CM | POA: Diagnosis not present

## 2020-01-24 DIAGNOSIS — R7303 Prediabetes: Secondary | ICD-10-CM | POA: Diagnosis not present

## 2020-01-24 DIAGNOSIS — D649 Anemia, unspecified: Secondary | ICD-10-CM | POA: Diagnosis not present

## 2020-01-24 DIAGNOSIS — F419 Anxiety disorder, unspecified: Secondary | ICD-10-CM | POA: Diagnosis present

## 2020-01-24 DIAGNOSIS — I25810 Atherosclerosis of coronary artery bypass graft(s) without angina pectoris: Secondary | ICD-10-CM | POA: Diagnosis not present

## 2020-01-24 DIAGNOSIS — I4891 Unspecified atrial fibrillation: Secondary | ICD-10-CM | POA: Diagnosis not present

## 2020-01-24 DIAGNOSIS — R531 Weakness: Secondary | ICD-10-CM | POA: Diagnosis not present

## 2020-01-24 DIAGNOSIS — I110 Hypertensive heart disease with heart failure: Secondary | ICD-10-CM | POA: Diagnosis present

## 2020-01-24 DIAGNOSIS — I739 Peripheral vascular disease, unspecified: Secondary | ICD-10-CM | POA: Diagnosis not present

## 2020-01-24 DIAGNOSIS — I483 Typical atrial flutter: Secondary | ICD-10-CM | POA: Diagnosis not present

## 2020-01-24 DIAGNOSIS — E785 Hyperlipidemia, unspecified: Secondary | ICD-10-CM | POA: Diagnosis present

## 2020-01-24 DIAGNOSIS — E782 Mixed hyperlipidemia: Secondary | ICD-10-CM | POA: Diagnosis not present

## 2020-01-24 DIAGNOSIS — E8809 Other disorders of plasma-protein metabolism, not elsewhere classified: Secondary | ICD-10-CM | POA: Diagnosis present

## 2020-01-24 DIAGNOSIS — D62 Acute posthemorrhagic anemia: Secondary | ICD-10-CM | POA: Diagnosis not present

## 2020-01-24 DIAGNOSIS — L89159 Pressure ulcer of sacral region, unspecified stage: Secondary | ICD-10-CM | POA: Diagnosis present

## 2020-01-24 DIAGNOSIS — I25119 Atherosclerotic heart disease of native coronary artery with unspecified angina pectoris: Secondary | ICD-10-CM | POA: Diagnosis not present

## 2020-01-24 DIAGNOSIS — R600 Localized edema: Secondary | ICD-10-CM | POA: Diagnosis not present

## 2020-01-24 DIAGNOSIS — R627 Adult failure to thrive: Secondary | ICD-10-CM | POA: Diagnosis not present

## 2020-01-24 LAB — SEDIMENTATION RATE: Sed Rate: 5 mm/hr (ref 0–16)

## 2020-01-24 LAB — CBC
HCT: 37 % — ABNORMAL LOW (ref 39.0–52.0)
Hemoglobin: 11.8 g/dL — ABNORMAL LOW (ref 13.0–17.0)
MCH: 30.8 pg (ref 26.0–34.0)
MCHC: 31.9 g/dL (ref 30.0–36.0)
MCV: 96.6 fL (ref 80.0–100.0)
Platelets: 244 10*3/uL (ref 150–400)
RBC: 3.83 MIL/uL — ABNORMAL LOW (ref 4.22–5.81)
RDW: 13.1 % (ref 11.5–15.5)
WBC: 11 10*3/uL — ABNORMAL HIGH (ref 4.0–10.5)
nRBC: 0 % (ref 0.0–0.2)

## 2020-01-24 LAB — SARS CORONAVIRUS 2 (TAT 6-24 HRS): SARS Coronavirus 2: NEGATIVE

## 2020-01-24 LAB — CREATININE, SERUM
Creatinine, Ser: 1.33 mg/dL — ABNORMAL HIGH (ref 0.61–1.24)
GFR, Estimated: 51 mL/min — ABNORMAL LOW (ref >60)

## 2020-01-24 LAB — C-REACTIVE PROTEIN: CRP: <0.5 mg/dL (ref <1.0)

## 2020-01-24 MED ORDER — VANCOMYCIN HCL IN DEXTROSE 1-5 GM/200ML-% IV SOLN
1000.0000 mg | Freq: Once | INTRAVENOUS | Status: AC
  Administered 2020-01-24: 1000 mg via INTRAVENOUS
  Filled 2020-01-24: qty 200

## 2020-01-24 MED ORDER — COLLAGENASE 250 UNIT/GM EX OINT
TOPICAL_OINTMENT | Freq: Every day | CUTANEOUS | Status: DC
  Filled 2020-01-24: qty 30

## 2020-01-24 MED ORDER — TIOTROPIUM BROMIDE MONOHYDRATE 2.5 MCG/ACT IN AERS: 2.0000 | INHALATION_SPRAY | Freq: Every day | RESPIRATORY_TRACT | Status: DC

## 2020-01-24 MED ORDER — PROSIGHT PO TABS
1.0000 | ORAL_TABLET | Freq: Every day | ORAL | Status: DC
  Administered 2020-01-24 – 2020-01-25 (×2): 1 via ORAL
  Filled 2020-01-24 (×3): qty 1

## 2020-01-24 MED ORDER — MIRABEGRON ER 25 MG PO TB24
25.0000 mg | ORAL_TABLET | Freq: Every day | ORAL | Status: DC
  Administered 2020-01-24 – 2020-02-05 (×11): 25 mg via ORAL
  Filled 2020-01-24 (×14): qty 1

## 2020-01-24 MED ORDER — PANTOPRAZOLE SODIUM 40 MG PO TBEC
40.0000 mg | DELAYED_RELEASE_TABLET | Freq: Every day | ORAL | Status: DC
  Administered 2020-01-24 – 2020-01-26 (×3): 40 mg via ORAL
  Filled 2020-01-24 (×3): qty 1

## 2020-01-24 MED ORDER — POLYVINYL ALCOHOL 1.4 % OP SOLN
1.0000 [drp] | OPHTHALMIC | Status: DC | PRN
  Filled 2020-01-24: qty 15

## 2020-01-24 MED ORDER — VANCOMYCIN HCL IN DEXTROSE 1-5 GM/200ML-% IV SOLN
1000.0000 mg | INTRAVENOUS | Status: DC
  Administered 2020-01-24: 1000 mg via INTRAVENOUS
  Filled 2020-01-24 (×2): qty 200

## 2020-01-24 MED ORDER — SPIRONOLACTONE 25 MG PO TABS
25.0000 mg | ORAL_TABLET | Freq: Every day | ORAL | Status: DC
  Administered 2020-01-24 – 2020-01-25 (×2): 25 mg via ORAL
  Filled 2020-01-24 (×2): qty 1

## 2020-01-24 MED ORDER — NITROGLYCERIN 0.4 MG SL SUBL
0.4000 mg | SUBLINGUAL_TABLET | SUBLINGUAL | Status: DC | PRN
  Administered 2020-01-26: 0.4 mg via SUBLINGUAL
  Filled 2020-01-24: qty 1

## 2020-01-24 MED ORDER — LEVOTHYROXINE SODIUM 50 MCG PO TABS
50.0000 ug | ORAL_TABLET | Freq: Every day | ORAL | Status: DC
  Administered 2020-01-25 – 2020-02-05 (×11): 50 ug via ORAL
  Filled 2020-01-24 (×11): qty 1

## 2020-01-24 MED ORDER — ENOXAPARIN SODIUM 30 MG/0.3ML ~~LOC~~ SOLN
30.0000 mg | SUBCUTANEOUS | Status: DC
  Administered 2020-01-24 – 2020-01-27 (×4): 30 mg via SUBCUTANEOUS
  Filled 2020-01-24 (×4): qty 0.3

## 2020-01-24 MED ORDER — ATORVASTATIN CALCIUM 10 MG PO TABS
20.0000 mg | ORAL_TABLET | Freq: Every day | ORAL | Status: DC
  Administered 2020-01-24 – 2020-01-26 (×3): 20 mg via ORAL
  Filled 2020-01-24 (×3): qty 2

## 2020-01-24 MED ORDER — GADOBUTROL 1 MMOL/ML IV SOLN
6.0000 mL | Freq: Once | INTRAVENOUS | Status: AC | PRN
  Administered 2020-01-24: 6 mL via INTRAVENOUS

## 2020-01-24 MED ORDER — VITAMIN B-12 1000 MCG PO TABS
1000.0000 ug | ORAL_TABLET | Freq: Every day | ORAL | Status: DC
  Administered 2020-01-24 – 2020-02-05 (×11): 1000 ug via ORAL
  Filled 2020-01-24 (×13): qty 1

## 2020-01-24 MED ORDER — ALPRAZOLAM 0.5 MG PO TABS
0.5000 mg | ORAL_TABLET | Freq: Three times a day (TID) | ORAL | Status: DC
  Administered 2020-01-24 – 2020-01-29 (×14): 0.5 mg via ORAL
  Filled 2020-01-24 (×17): qty 1

## 2020-01-24 MED ORDER — CALCIUM CARBONATE ANTACID 500 MG PO CHEW: 1.0000 | CHEWABLE_TABLET | Freq: Two times a day (BID) | ORAL | Status: DC | PRN

## 2020-01-24 MED ORDER — POLYETHYL GLYCOL-PROPYL GLYCOL 0.4-0.3 % OP SOLN: 1.0000 [drp] | Freq: Two times a day (BID) | OPHTHALMIC | Status: DC | PRN

## 2020-01-24 MED ORDER — ASPIRIN EC 81 MG PO TBEC
81.0000 mg | DELAYED_RELEASE_TABLET | Freq: Every day | ORAL | Status: DC
  Administered 2020-01-24 – 2020-02-05 (×13): 81 mg via ORAL
  Filled 2020-01-24 (×13): qty 1

## 2020-01-24 MED ORDER — FLUTICASONE PROPIONATE 50 MCG/ACT NA SUSP
2.0000 | Freq: Every day | NASAL | Status: DC
  Administered 2020-01-25 – 2020-01-28 (×2): 2 via NASAL
  Filled 2020-01-24: qty 16

## 2020-01-24 MED ORDER — FUROSEMIDE 20 MG PO TABS
30.0000 mg | ORAL_TABLET | Freq: Two times a day (BID) | ORAL | Status: DC
  Administered 2020-01-24: 30 mg via ORAL
  Filled 2020-01-24: qty 2

## 2020-01-24 MED ORDER — POLYETHYLENE GLYCOL 3350 17 G PO PACK
17.0000 g | PACK | Freq: Every day | ORAL | Status: DC
  Administered 2020-01-25 – 2020-01-30 (×3): 17 g via ORAL
  Filled 2020-01-24 (×6): qty 1

## 2020-01-24 MED ORDER — ZINC OXIDE 12.8 % EX OINT: TOPICAL_OINTMENT | Freq: Two times a day (BID) | CUTANEOUS | Status: DC | PRN

## 2020-01-24 MED ORDER — LEVALBUTEROL HCL 0.63 MG/3ML IN NEBU
0.6300 mg | INHALATION_SOLUTION | RESPIRATORY_TRACT | Status: DC | PRN
  Filled 2020-01-24 (×2): qty 3

## 2020-01-24 MED ORDER — VANCOMYCIN HCL IN DEXTROSE 1-5 GM/200ML-% IV SOLN: 1000.0000 mg | INTRAVENOUS | Status: DC

## 2020-01-24 MED ORDER — VITAMIN D 25 MCG (1000 UNIT) PO TABS
2000.0000 [IU] | ORAL_TABLET | Freq: Every day | ORAL | Status: DC
  Administered 2020-01-25 – 2020-02-05 (×10): 2000 [IU] via ORAL
  Filled 2020-01-24 (×12): qty 2

## 2020-01-24 MED ORDER — FINASTERIDE 5 MG PO TABS
5.0000 mg | ORAL_TABLET | Freq: Every day | ORAL | Status: DC
  Administered 2020-01-24 – 2020-02-05 (×12): 5 mg via ORAL
  Filled 2020-01-24 (×13): qty 1

## 2020-01-24 MED ORDER — UMECLIDINIUM BROMIDE 62.5 MCG/INH IN AEPB
1.0000 | INHALATION_SPRAY | Freq: Every day | RESPIRATORY_TRACT | Status: DC
  Filled 2020-01-24: qty 7

## 2020-01-24 MED ORDER — FLUTICASONE FUROATE-VILANTEROL 200-25 MCG/INH IN AEPB
1.0000 | INHALATION_SPRAY | Freq: Every day | RESPIRATORY_TRACT | Status: DC
  Filled 2020-01-24: qty 28

## 2020-01-24 MED ORDER — POTASSIUM CHLORIDE 20 MEQ PO PACK
20.0000 meq | PACK | Freq: Every day | ORAL | Status: DC
  Administered 2020-01-24 – 2020-01-28 (×4): 20 meq via ORAL
  Filled 2020-01-24 (×6): qty 1

## 2020-01-24 MED ORDER — VANCOMYCIN HCL 750 MG/150ML IV SOLN
750.0000 mg | INTRAVENOUS | Status: DC
  Administered 2020-01-25: 750 mg via INTRAVENOUS
  Filled 2020-01-24 (×2): qty 150

## 2020-01-24 MED ORDER — METOPROLOL SUCCINATE ER 25 MG PO TB24
12.5000 mg | ORAL_TABLET | Freq: Every day | ORAL | Status: DC
Start: 2020-01-24 — End: 2020-02-02
  Administered 2020-01-24 – 2020-02-02 (×10): 12.5 mg via ORAL
  Filled 2020-01-24 (×11): qty 1

## 2020-01-24 NOTE — H&P (Addendum)
History and Physical  [name] [id number] DOB: [birth year] DOA: 01/23/2020  Referring physician:Glick, Shanon Brow, MD PCP: [name], MD  Outpatient Specialists:  Patient coming from: Home & is able to ambulate   Chief Complaint: Left foot pain and redness  HPI: [name] is a 87 y.o. male with medical history significant for hypertension, hyperlipidemia, COPD, 2 L/min as well as trilogy, diastolic heart failure, status post right above-knee amputation, sacral decubitus ulcer, who comes in because of ongoing problems with his cellulitis of the left leg he has been seeing his primary care provider who has been managing him with outpatient antibiotics but it got worse and was sent to the emergency department 5 days ago for possible admission for cellulitis he was evaluated and found to have cellulitis but did not require inpatient care and was sent home on oral doxycycline.  He was then seen at the wound clinic left foot has continued to worsen despite antibiotics.  He was seen again  by his primary care provider yesterday who directed him to return to the emergency room for admission   ED Course: In the emergency room he was noted to have redness of his left foot going towards the ankle  Review of Systems: Pain left foot   Pt denies any fever chills.  Review of systems are otherwise negative   Past Medical History:  Diagnosis Date  . Anxiety   . Arthritis    "generalized" (04/04/2017)  . Asthma   . Basal cell carcinoma    "left ear"  . BPH (benign prostatic hyperplasia)    severe; s/p multiple biopsies  . CAD (coronary artery disease)   . Carotid artery occlusion   . Chronic respiratory failure ([place])   . Chronic rhinitis   . COPD (chronic obstructive pulmonary disease) (HCC)    2L [place] O2  . Diastolic heart failure ([place]) 2019  . Dilation of biliary tract   . Elevated troponin 03/09/2017  . Gallstones   . GERD (gastroesophageal reflux disease)   . History of blood  transfusion    "w/his CABG" (04/04/2017)  . History of kidney stones   . Hyperlipidemia   . Hypertension   . On home oxygen therapy    "~ 24/7" (04/04/2017)  . Peripheral vascular disease ([place])   . Pneumonia 2019  . Squamous cell carcinoma of skin 04/23/2013   in situ-Right flank (txpbx)  . Squamous cell carcinoma of skin 01/23/2017   [place]/ in siut- Left ear rim (CX3FU)  . Syncope and collapse   . Ureteral tumor 08/2015   had endoscopic procedure for evaluation, unable to reach for biopsy  . Urinary catheter (Foley) change required    Past Surgical History:  Procedure Laterality Date  . ABDOMINAL AORTOGRAM W/LOWER EXTREMITY Bilateral 06/30/2019   Procedure: ABDOMINAL AORTOGRAM W/LOWER EXTREMITY;  Surgeon: Waynetta Sandy, MD;  Location: [facility];  Service: Cardiovascular;  Laterality: Bilateral;  . AMPUTATION Right 08/11/2019   Procedure: RIGHT ABOVE KNEE AMPUTATION;  Surgeon: Angelia Mould, MD;  Location: [facility];  Service: Vascular;  Laterality: Right;  . BASAL CELL CARCINOMA EXCISION Left    ear  . CARDIAC CATHETERIZATION     "prior to bypass"  . CATARACT EXTRACTION W/ INTRAOCULAR LENS  IMPLANT, BILATERAL Bilateral   . CORONARY ARTERY BYPASS GRAFT  10/2001   LIMA to LAD, SVG to OM1-2, SVG to RCA and PDA  . LITHOTRIPSY    . PROSTATE BIOPSY  Oct. 2014  .  TONSILLECTOMY      Social History:  reports that he quit smoking about 37 years ago. His smoking use included cigarettes. He has a 66.00 pack-year smoking history. He has never used smokeless tobacco. He reports current alcohol use. He reports that he does not use drugs.   Allergies  Allergen Reactions  . Cefdinir Diarrhea and Other (See Comments)    Severe Diarrhea  . Nitrofurantoin Swelling and Other (See Comments)    Hand Swelling  . Sulfa Antibiotics Anaphylaxis and Swelling  . Sulfonamide Derivatives Swelling and Other (See Comments)    Facial/tongue swelling  . Tape Other (See Comments)     SKIN IS VERY THIN AND TEARS EASILY!!!!! Please do NOT use "plastic" tape- USE PAPER!!  . Ciprofloxacin Itching, Rash and Other (See Comments)    Red itchy hands  . Nitrofurantoin Swelling and Other (See Comments)    Swollen hands  . Amoxicillin Er Other (See Comments)    Frequest urination  . Doxycycline Other (See Comments)    "Felt terrible"  . Levaquin [Levofloxacin] Itching and Rash  . Sertraline Anxiety and Other (See Comments)    Makes the patient jittery    Family History  Problem Relation Age of Onset  . Heart disease Mother   . Heart disease Father        Before age 83  . Breast cancer Mother   . Cancer Mother        Breast cancer  . Hypertension Mother   . Other Mother        AAA  and   Amputation  . Heart attack Father   . Stroke Brother        x3, still living   . Peripheral vascular disease Brother   . Heart disease Brother   . Hyperlipidemia Brother   . Hypertension Brother   . Heart disease Brother   . Allergies Brother      Prior to Admission medications   Medication Sig Start Date End Date Taking? Authorizing Provider  ALPRAZolam Duanne Moron) 0.5 MG tablet Take 1 tablet (0.5 mg total) by mouth 3 (three) times daily. 12/24/19   Tanda Rockers, MD  aspirin 81 MG tablet Take 81 mg by mouth daily.    [provider]  atorvastatin (LIPITOR) 20 MG tablet TAKE 1 TABLET BY MOUTH EVERY DAY Patient taking differently: Take 20 mg by mouth at bedtime. 05/24/16   Belva Crome, MD  calcium carbonate (TUMS - DOSED IN MG ELEMENTAL CALCIUM) 500 MG chewable tablet Chew 1 tablet (200 mg of elemental calcium total) by mouth 2 (two) times daily as needed for indigestion or heartburn. 09/01/19   Love, Ivan Anchors, PA-C  cephALEXin (KEFLEX) 500 MG capsule Take 1 capsule (500 mg total) by mouth 4 (four) times daily. 01/19/20   Jamse Arn, MD  Cholecalciferol (VITAMIN D3) 2000 units TABS Take 2,000 Units by mouth daily with lunch.     [provider]   doxycycline (VIBRAMYCIN) 100 MG capsule Take 1 capsule (100 mg total) by mouth 2 (two) times daily. One po bid x 7 days 01/20/20   Deno Etienne, DO  famotidine (PEPCID) 20 MG tablet Take 20 mg by mouth at bedtime.     [provider]  finasteride (PROSCAR) 5 MG tablet Take 5 mg by mouth daily with lunch.     [provider]  fluticasone (FLONASE) 50 MCG/ACT nasal spray Place 2 sprays into both nostrils daily.    [provider]  furosemide (LASIX) 20 MG tablet Take two tablets by mouth every morning.  Take one tablet by mouth every evening. 12/08/19   Belva Crome, MD  levalbuterol [facility] HFA) 45 MCG/ACT inhaler Inhale 2 puffs into the lungs every 4 (four) hours as needed for wheezing or shortness of breath. 04/14/19   Tanda Rockers, MD  levalbuterol (XOPENEX) 0.63 MG/3ML nebulizer solution Take 3 mLs (0.63 mg total) by nebulization every 4 (four) hours as needed for wheezing or shortness of breath. 10/13/19   Tanda Rockers, MD  levothyroxine (SYNTHROID) 50 MCG tablet Take 50 mcg by mouth daily. 10/27/19   [provider]  metoprolol succinate (TOPROL-XL) 25 MG 24 hr tablet Take 0.5 tablets (12.5 mg total) by mouth daily. 07/11/19   Belva Crome, MD  mometasone (ELOCON) 0.1 % cream Apply topically daily as needed. 11/10/19   [provider]  Multiple Vitamins-Minerals (PRESERVISION AREDS 2) CAPS Take 1 capsule by mouth daily.    [provider]  MYRBETRIQ 25 MG TB24 tablet Take 25 mg by mouth daily. 12/12/19   [provider]  NITROSTAT 0.4 MG SL tablet Place 0.4 mg under the tongue every 5 (five) minutes as needed for chest pain.    [provider]  OXYGEN Inhale 2 L/min into the lungs continuous.     [provider]  pantoprazole (PROTONIX) 40 MG tablet TAKE 1 TABLET(40 MG) BY MOUTH DAILY 30 TO 60 MINUTES BEFORE FIRST MEAL OF THE DAY 10/13/19   Tanda Rockers, MD  Polyethyl Glycol-Propyl Glycol (SYSTANE) 0.4-0.3 %  SOLN Apply 1 drop to eye 2 (two) times daily as needed (eye irritation). 09/01/19   Love, Ivan Anchors, PA-C  polyethylene glycol (MIRALAX / GLYCOLAX) 17 g packet Take 17 g by mouth daily. 09/01/19   Love, Ivan Anchors, PA-C  potassium chloride 20 MEQ/15ML (10%) SOLN Take 20 mEq by mouth daily. 10/16/19   [provider]  predniSONE (DELTASONE) 10 MG tablet FOR FLARE OF COUGH/WHEEZING MAY INCREASE TO 2 DAILY BETTER, THAN 1 TABLET BY MOUTH EVERY DAY 10/13/19   Tanda Rockers, MD  sodium chloride (MURO 128) 5 % ophthalmic solution Place 1 drop into both eyes 4 (four) times daily. 08/22/19   Love, Ivan Anchors, PA-C  spironolactone (ALDACTONE) 25 MG tablet Take 25 mg by mouth daily.    [provider]  SYMBICORT 160-4.5 MCG/ACT inhaler Inhale 2 puffs into the lungs 2 (two) times daily. 06/06/19   Tanda Rockers, MD  Tiotropium Bromide Monohydrate (SPIRIVA RESPIMAT) 2.5 MCG/ACT AERS INHALE 2 PUFFS BY MOUTH EVERY DAY Patient taking differently: Inhale 2 puffs into the lungs daily. 05/14/19   Tanda Rockers, MD  vitamin B-12 (CYANOCOBALAMIN) 1000 MCG tablet Take 1,000 mcg by mouth daily.     [provider]  Zinc Oxide (TRIPLE PASTE) 12.8 % ointment Apply topically as needed for irritation. To gluteal cleft/buttocks 09/01/19   Love, Ivan Anchors, PA-C    Physical Exam: BP (!) 144/76   Pulse 90   Temp (!) 97.5 F (36.4 C) (Oral)   Resp 19   SpO2 100%   Exam:  . General: 87 y.o. year-old male well developed well nourished in no acute distress.  Alert and oriented x3. . Cardiovascular: Regular rate and rhythm with no rubs or gallops.  No thyromegaly or JVD noted.   Marland Kitchen Respiratory: Clear to auscultation with no wheezes or rales. Good inspiratory effort. . Abdomen: Soft nontender nondistended with normal bowel sounds x4  quadrants. . Musculoskeletal: No lower extremity edema. 2/4 pulses in all 4 extremities. . Skin: Right stump ulcer with possible bone exposure.  Erythema of the left forefoot  extending to the toes particularly the third fourth and fifth digit.  Stable stage II sacral decubitus ulcer . Psychiatry: Mood is appropriate for condition and setting           Labs on Admission:  Basic Metabolic Panel: Recent Labs  Lab 01/19/20 2014 01/23/20 1957  NA 135 132*  K 5.0 4.0  CL 93* 89*  CO2 32 32  GLUCOSE 133* 130*  BUN 33* 32*  CREATININE 1.35* 1.46*  CALCIUM 9.6 9.4   Liver Function Tests: Recent Labs  Lab 01/19/20 2014 01/23/20 1957  AST 37 29  ALT 60* 46*  ALKPHOS 60 61  BILITOT 0.8 0.9  PROT 6.5 6.3*  ALBUMIN 4.3 3.9   No results for input(s): LIPASE, AMYLASE in the last 168 hours. No results for input(s): AMMONIA in the last 168 hours. CBC: Recent Labs  Lab 01/19/20 2014 01/23/20 1957  WBC 14.5* 12.0*  NEUTROABS 13.4* 11.1*  HGB 12.5* 12.2*  HCT 37.6* 36.0*  MCV 97.2 95.0  PLT 305 266   Cardiac Enzymes: No results for input(s): CKTOTAL, CKMB, CKMBINDEX, TROPONINI in the last 168 hours.  BNP (last 3 results) No results for input(s): BNP in the last 8760 hours.  ProBNP (last 3 results) No results for input(s): PROBNP in the last 8760 hours.  CBG: No results for input(s): GLUCAP in the last 168 hours.  Radiological Exams on Admission: DG Knee 2 Views Right  Result Date: 01/23/2020 CLINICAL DATA:  Prior right above the knee amputation. EXAM: RIGHT KNEE - 1-2 VIEW COMPARISON:  None. FINDINGS: A right-sided above the knee amputation is seen without evidence of acute fracture. Marked severity vascular calcification is noted. IMPRESSION: Right-sided above the knee amputation without evidence of acute osseous abnormality. Electronically Signed   By: Virgina Norfolk M.D.   On: 01/23/2020 20:20   DG Foot Complete Left  Result Date: 01/23/2020 CLINICAL DATA:  Left foot swelling. EXAM: LEFT FOOT - COMPLETE 3+ VIEW COMPARISON:  None. FINDINGS: There is no evidence of fracture or dislocation. There is no evidence of arthropathy or other focal  bone abnormality. There is marked severity diffuse soft tissue swelling, most prominent along the dorsal aspect of the left foot. Mild vascular calcification is seen. IMPRESSION: Marked severity diffuse soft tissue swelling, without an acute osseous abnormality. Electronically Signed   By: Virgina Norfolk M.D.   On: 01/23/2020 20:21    EKG: None  Assessment/Plan Present on Admission: . Cellulitis of left foot . Obstructive sleep apnea . Chronic respiratory failure with hypoxia and hypercapnia (HCC) . Peripheral vascular disease ([place]) . Hyperlipidemia . Cellulitis and abscess of left lower extremity  Principal Problem:   Cellulitis of left foot Active Problems:   Hyperlipidemia   Obstructive sleep apnea   Chronic respiratory failure with hypoxia and hypercapnia (HCC)   Peripheral vascular disease (HCC)   Cellulitis and abscess of left lower extremity  1. left foot cellulitis failed multiple outpatient antibiotics MRI does not show any osteomyelitis he has been started on vancomycin  2.  Right stump ulcer.  Right BKA x-ray does not show any bony abnormality  3.  Leukocytosis likely from a cellulitis he was started on IV antibiotics vancomycin  4.  Mild hyponatremia we will monitor and replace  5.  Chronic respiratory failure on home O2 at 2 L/min  6.  COPD stable  7.  Acute kidney injury with creatinine of 1.46 on admission continue to monitor and avoid nephrotoxic medication  8.  Peripheral vascular disease being managed by vascular surgery  9.  Severity of Illness: The appropriate patient status for this patient is INPATIENT. Inpatient status is judged to be reasonable and necessary in order to provide the required intensity of service to ensure the patient's safety. The patient's presenting symptoms, physical exam findings, and initial radiographic and laboratory data in the context of their chronic comorbidities is felt to place them at high risk for further clinical  deterioration. Furthermore, it is not anticipated that the patient will be medically stable for discharge from the hospital within 2 midnights of admission. The following factors support the patient status of inpatient.   " The patient's presenting symptoms include cellulitis of the left lower extremity. " The worrisome physical exam findings include red warmth lower extremity. " The initial radiographic and laboratory data are worrisome because of leukocytosis. " The chronic co-morbidities include COPD failed outpatient therapy with antibiotics x2, rightAKA   * I certify that at the point of admission it is my clinical judgment that the patient will require inpatient hospital care spanning beyond 2 midnights from the point of admission due to high intensity of service, high risk for further deterioration and high frequency of surveillance required.*    DVT prophylaxis: Lovenox  Code Status: Partial with no intubation  Family Communication: Wife at bedside  Disposition Plan: Home when stable  Consults called: Wound care  Admission status: Inpatient    Cristal Deer MD [facility] Pager [phone]  If 7PM-7AM, please contact night-coverage [email or web address] Password [facility]  01/24/2020, 11:03 AM

## 2020-01-24 NOTE — ED Provider Notes (Signed)
[name]   CSN: [id number] Arrival date & time: 01/23/20  1819   History Chief Complaint  Patient presents with  . Wound Check    [name] is a 87 y.o. male.  The history is provided by the patient and the spouse.  Wound Check  He has history of hypertension, hyperlipidemia, COPD, diastolic heart failure, status post right above-the-knee amputation and comes in because of ongoing problems with cellulitis of the left foot.  He had been seen by his primary care provider and sent to the emergency department 5 days ago for possible admission for cellulitis.  He was evaluated and found to have cellulitis but not requiring inpatient care and was sent home on oral doxycycline.  He was seen at the wound care clinic.  Left foot has not improved on antibiotics.  He was seen by his primary care provider yesterday who directed him to the emergency department for admission.  He has not been running a fever and there have been no chills or sweats.  He is complaining of pain in his left foot, and also pain in the stump of his right leg.  He has a sacral decubitus which has been stable.  Past Medical History:  Diagnosis Date  . Anxiety   . Arthritis    "generalized" (04/04/2017)  . Asthma   . Basal cell carcinoma    "left ear"  . BPH (benign prostatic hyperplasia)    severe; s/p multiple biopsies  . CAD (coronary artery disease)   . Carotid artery occlusion   . Chronic respiratory failure ([place])   . Chronic rhinitis   . COPD (chronic obstructive pulmonary disease) (HCC)    2L [place] O2  . Diastolic heart failure ([place]) 2019  . Dilation of biliary tract   . Elevated troponin 03/09/2017  . Gallstones   . GERD (gastroesophageal reflux disease)   . History of blood transfusion    "w/his CABG" (04/04/2017)  . History of kidney stones   . Hyperlipidemia   . Hypertension   . On home oxygen therapy    "~ 24/7" (04/04/2017)  . Peripheral  vascular disease ([place])   . Pneumonia 2019  . Squamous cell carcinoma of skin 04/23/2013   in situ-Right flank (txpbx)  . Squamous cell carcinoma of skin 01/23/2017   [place]/ in siut- Left ear rim (CX3FU)  . Syncope and collapse   . Ureteral tumor 08/2015   had endoscopic procedure for evaluation, unable to reach for biopsy  . Urinary catheter (Foley) change required     Patient Active Problem List   Diagnosis Date Noted  . Abnormality of gait 10/20/2019  . Pain of left heel 09/01/2019  . Labile blood pressure   . Anxiety state   . Hypothyroidism   . Right hip pain   . Pressure injury of skin of coccygeal region   . Post-operative pain   . Chronic systolic congestive heart failure ([place])   . Urinary retention due to benign prostatic hyperplasia 08/20/2019  . SOB (shortness of breath)   . Chronic systolic CHF (congestive heart failure) ([place])   . Hypoalbuminemia due to protein-calorie malnutrition ([place])   . Transaminitis   . Acute blood loss anemia   . Hyponatremia   . Postoperative pain   . Supplemental oxygen dependent   . S/P AKA (above knee amputation) ([place]) 08/14/2019  . Peripheral arterial disease ([place]) 08/11/2019  . Abnormal urinalysis 08/11/2019  . Critical lower limb ischemia (  [place]) 08/11/2019  . DNR (do not resuscitate)   . Voiding difficulty   . Palliative care by specialist   . Pressure injury of skin 06/29/2019  . Cellulitis of left lower extremity 06/28/2019  . Cellulitis of right lower extremity 06/27/2019  . AKI (acute kidney injury) ([place]) 06/27/2019  . Headache 10/16/2018  . Severe major depression without psychotic features ([place]) 09/22/2017  . Generalized anxiety disorder 09/22/2017  . Preoperative cardiovascular examination 05/10/2017  . Weakness generalized 04/04/2017  . Chest pain 03/24/2017  . AV block, [place] 1 03/24/2017  . CAD (coronary artery disease) 03/24/2017  . Chronic diastolic CHF (congestive heart failure) ([place]) 03/24/2017  . COPD  (chronic obstructive pulmonary disease) ([place]) 03/24/2017  . NSTEMI (non-ST elevated myocardial infarction) ([place])   . Elevated troponin 03/09/2017  . Acute on chronic diastolic heart failure ([place])   . Tachycardia 03/08/2017  . Influenza A 02/04/2017  . Cor pulmonale, chronic (HCC)/ clinical dx  09/13/2016  . Orthostatic hypotension 09/26/2015  . Angina at rest [facility]) 09/24/2015  . BPH (benign prostatic hyperplasia) 09/13/2015  . GERD (gastroesophageal reflux disease) 09/13/2015  . Anxiety 09/13/2015  . Sinusitis, chronic 09/09/2015  . DOE (dyspnea on exertion) 04/15/2015  . Acute heart failure ([place]) 03/18/2015  . Right calf pain 06/18/2014  . Coronary artery disease involving coronary bypass graft of native heart with angina pectoris ([place])   . Peripheral vascular disease ([place])   . Essential hypertension   . Obstructive sleep apnea   . Rhinitis, chronic 01/29/2007  . COPD   GOLD IV/ 02/steroid dep  01/29/2007  . Chronic respiratory failure with hypoxia and hypercapnia ([place]) 01/29/2007  . Hyperlipidemia     Past Surgical History:  Procedure Laterality Date  . ABDOMINAL AORTOGRAM W/LOWER EXTREMITY Bilateral 06/30/2019   Procedure: ABDOMINAL AORTOGRAM W/LOWER EXTREMITY;  Surgeon: Waynetta Sandy, MD;  Location: [facility];  Service: Cardiovascular;  Laterality: Bilateral;  . AMPUTATION Right 08/11/2019   Procedure: RIGHT ABOVE KNEE AMPUTATION;  Surgeon: Angelia Mould, MD;  Location: [facility];  Service: Vascular;  Laterality: Right;  . BASAL CELL CARCINOMA EXCISION Left    ear  . CARDIAC CATHETERIZATION     "prior to bypass"  . CATARACT EXTRACTION W/ INTRAOCULAR LENS  IMPLANT, BILATERAL Bilateral   . CORONARY ARTERY BYPASS GRAFT  10/2001   LIMA to LAD, SVG to OM1-2, SVG to RCA and PDA  . LITHOTRIPSY    . PROSTATE BIOPSY  Oct. 2014  . TONSILLECTOMY         Family History  Problem Relation Age of Onset  . Heart disease Mother   . Heart disease Father         Before age 19  . Breast cancer Mother   . Cancer Mother        Breast cancer  . Hypertension Mother   . Other Mother        AAA  and   Amputation  . Heart attack Father   . Stroke Brother        x3, still living   . Peripheral vascular disease Brother   . Heart disease Brother   . Hyperlipidemia Brother   . Hypertension Brother   . Heart disease Brother   . Allergies Brother     Social History   Tobacco Use  . Smoking status: Former Smoker    Packs/day: 2.00    Years: 33.00    Pack years: 66.00    Types: Cigarettes  Quit date: 03/25/1982    Years since quitting: 37.8  . Smokeless tobacco: Never Used  Vaping Use  . Vaping Use: Never used  Substance Use Topics  . Alcohol use: Yes    Alcohol/week: 0.0 standard drinks    Comment: 1.5-3 oz daily in the past, minimal use in the last few months  . Drug use: No    Home Medications Prior to Admission medications   Medication Sig Start Date End Date Taking? Authorizing Provider  ALPRAZolam Duanne Moron) 0.5 MG tablet Take 1 tablet (0.5 mg total) by mouth 3 (three) times daily. 12/24/19   Tanda Rockers, MD  aspirin 81 MG tablet Take 81 mg by mouth daily.    [provider]  atorvastatin (LIPITOR) 20 MG tablet TAKE 1 TABLET BY MOUTH EVERY DAY Patient taking differently: Take 20 mg by mouth at bedtime. 05/24/16   Belva Crome, MD  calcium carbonate (TUMS - DOSED IN MG ELEMENTAL CALCIUM) 500 MG chewable tablet Chew 1 tablet (200 mg of elemental calcium total) by mouth 2 (two) times daily as needed for indigestion or heartburn. 09/01/19   Love, Ivan Anchors, PA-C  cephALEXin (KEFLEX) 500 MG capsule Take 1 capsule (500 mg total) by mouth 4 (four) times daily. 01/19/20   Jamse Arn, MD  Cholecalciferol (VITAMIN D3) 2000 units TABS Take 2,000 Units by mouth daily with lunch.     [provider]  doxycycline (VIBRAMYCIN) 100 MG capsule Take 1 capsule (100 mg total) by mouth 2 (two) times daily. One po bid x 7 days  01/20/20   Deno Etienne, DO  famotidine (PEPCID) 20 MG tablet Take 20 mg by mouth at bedtime.     [provider]  finasteride (PROSCAR) 5 MG tablet Take 5 mg by mouth daily with lunch.     [provider]  fluticasone (FLONASE) 50 MCG/ACT nasal spray Place 2 sprays into both nostrils daily.    [provider]  furosemide (LASIX) 20 MG tablet Take two tablets by mouth every morning.  Take one tablet by mouth every evening. 12/08/19   Belva Crome, MD  levalbuterol [facility] HFA) 45 MCG/ACT inhaler Inhale 2 puffs into the lungs every 4 (four) hours as needed for wheezing or shortness of breath. 04/14/19   Tanda Rockers, MD  levalbuterol (XOPENEX) 0.63 MG/3ML nebulizer solution Take 3 mLs (0.63 mg total) by nebulization every 4 (four) hours as needed for wheezing or shortness of breath. 10/13/19   Tanda Rockers, MD  levothyroxine (SYNTHROID) 50 MCG tablet Take 50 mcg by mouth daily. 10/27/19   [provider]  metoprolol succinate (TOPROL-XL) 25 MG 24 hr tablet Take 0.5 tablets (12.5 mg total) by mouth daily. 07/11/19   Belva Crome, MD  mometasone (ELOCON) 0.1 % cream Apply topically daily as needed. 11/10/19   [provider]  Multiple Vitamins-Minerals (PRESERVISION AREDS 2) CAPS Take 1 capsule by mouth daily.    [provider]  MYRBETRIQ 25 MG TB24 tablet Take 25 mg by mouth daily. 12/12/19   [provider]  NITROSTAT 0.4 MG SL tablet Place 0.4 mg under the tongue every 5 (five) minutes as needed for chest pain.    [provider]  OXYGEN Inhale 2 L/min into the lungs continuous.     [provider]  pantoprazole (PROTONIX) 40 MG tablet TAKE 1 TABLET(40 MG) BY MOUTH DAILY 30 TO 60 MINUTES BEFORE FIRST MEAL OF THE DAY 10/13/19   Tanda Rockers, MD  Polyethyl Glycol-Propyl Glycol (SYSTANE) 0.4-0.3 % SOLN Apply 1 drop to eye 2 (two) times daily as needed (eye irritation). 09/01/19   Love, Ivan Anchors, PA-C  polyethylene glycol  (MIRALAX / GLYCOLAX) 17 g packet Take 17 g by mouth daily. 09/01/19   Love, Ivan Anchors, PA-C  potassium chloride 20 MEQ/15ML (10%) SOLN Take 20 mEq by mouth daily. 10/16/19   [provider]  predniSONE (DELTASONE) 10 MG tablet FOR FLARE OF COUGH/WHEEZING MAY INCREASE TO 2 DAILY BETTER, THAN 1 TABLET BY MOUTH EVERY DAY 10/13/19   Tanda Rockers, MD  sodium chloride (MURO 128) 5 % ophthalmic solution Place 1 drop into both eyes 4 (four) times daily. 08/22/19   Love, Ivan Anchors, PA-C  spironolactone (ALDACTONE) 25 MG tablet Take 25 mg by mouth daily.    [provider]  SYMBICORT 160-4.5 MCG/ACT inhaler Inhale 2 puffs into the lungs 2 (two) times daily. 06/06/19   Tanda Rockers, MD  Tiotropium Bromide Monohydrate (SPIRIVA RESPIMAT) 2.5 MCG/ACT AERS INHALE 2 PUFFS BY MOUTH EVERY DAY Patient taking differently: Inhale 2 puffs into the lungs daily. 05/14/19   Tanda Rockers, MD  vitamin B-12 (CYANOCOBALAMIN) 1000 MCG tablet Take 1,000 mcg by mouth daily.     [provider]  Zinc Oxide (TRIPLE PASTE) 12.8 % ointment Apply topically as needed for irritation. To gluteal cleft/buttocks 09/01/19   Love, Ivan Anchors, PA-C    Allergies    Cefdinir, Nitrofurantoin, Sulfa antibiotics, Sulfonamide derivatives, Tape, Ciprofloxacin, Nitrofurantoin, Amoxicillin er, Doxycycline, Levaquin [levofloxacin], and Sertraline  Review of Systems   Review of Systems  All other systems reviewed and are negative.   Physical Exam Updated Vital Signs BP (!) 150/93 (BP Location: Right Arm)   Pulse 89   Temp (!) 97.5 F (36.4 C) (Oral)   Resp (!) 26   SpO2 100%   Physical Exam Vitals and nursing [name] reviewed.   87 year old male, resting comfortably and in no acute distress. Vital signs are significant for elevated blood pressure and respiratory rate. Oxygen saturation is 100%, which is normal. Head is normocephalic and atraumatic. PERRLA, EOMI. Oropharynx is clear. Neck is nontender and supple  without adenopathy or JVD. Back is nontender and there is no CVA tenderness. Lungs are clear without rales, wheezes, or rhonchi. Chest is nontender. Heart has regular rate and rhythm without murmur. Abdomen is soft, flat, nontender without masses or hepatosplenomegaly and peristalsis is normoactive. Extremities: Right above-the-knee amputation.  There is an ulceration at the stump with a small portion of the femur protruding.  There is no erythema or drainage at the site.  2+ left pedal edema.  There is erythema of the distal left foot with erythema extending into the toes.  There is some area of superficial skin breakdown on the toes but no deep ulceration.  The left foot is cool to touch with slightly delayed capillary refill. Skin is warm and dry.  In addition to the skin lesion as noted above, there is a stage II sacral decubitus which does not appear infected. Neurologic: Mental status is normal, cranial nerves are intact, there are no motor or sensory deficits.    ED Results / Procedures / Treatments   Labs (all labs ordered are listed, but only abnormal results are displayed) Labs Reviewed  COMPREHENSIVE METABOLIC PANEL - Abnormal; Notable for the following components:      Result Value   Sodium 132 (*)    Chloride 89 (*)    Glucose, Bld  130 (*)    BUN 32 (*)    Creatinine, Ser 1.46 (*)    Total Protein 6.3 (*)    ALT 46 (*)    GFR, Estimated 46 (*)    All other components within normal limits  CBC WITH DIFFERENTIAL/PLATELET - Abnormal; Notable for the following components:   WBC 12.0 (*)    RBC 3.79 (*)    Hemoglobin 12.2 (*)    HCT 36.0 (*)    Neutro Abs 11.1 (*)    Lymphs Abs 0.3 (*)    Abs Immature Granulocytes 0.21 (*)    All other components within normal limits  SARS CORONAVIRUS 2 (TAT 6-24 HRS)  LACTIC ACID, PLASMA  LACTIC ACID, PLASMA  SEDIMENTATION RATE  C-REACTIVE PROTEIN    EKG None  Radiology DG Knee 2 Views Right  Result Date: 01/23/2020 CLINICAL  DATA:  Prior right above the knee amputation. EXAM: RIGHT KNEE - 1-2 VIEW COMPARISON:  None. FINDINGS: A right-sided above the knee amputation is seen without evidence of acute fracture. Marked severity vascular calcification is noted. IMPRESSION: Right-sided above the knee amputation without evidence of acute osseous abnormality. Electronically Signed   By: Virgina Norfolk M.D.   On: 01/23/2020 20:20   DG Foot Complete Left  Result Date: 01/23/2020 CLINICAL DATA:  Left foot swelling. EXAM: LEFT FOOT - COMPLETE 3+ VIEW COMPARISON:  None. FINDINGS: There is no evidence of fracture or dislocation. There is no evidence of arthropathy or other focal bone abnormality. There is marked severity diffuse soft tissue swelling, most prominent along the dorsal aspect of the left foot. Mild vascular calcification is seen. IMPRESSION: Marked severity diffuse soft tissue swelling, without an acute osseous abnormality. Electronically Signed   By: Virgina Norfolk M.D.   On: 01/23/2020 20:21    Procedures Procedures  Medications Ordered in ED Medications  vancomycin (VANCOCIN) IVPB 1000 mg/200 mL premix (has no administration in time range)    ED Course  I have reviewed the triage vital signs and the nursing notes.  Pertinent labs & imaging results that were available during my care of the patient were reviewed by me and considered in my medical decision making (see chart for details).  MDM Rules/Calculators/A&P Cellulitis of the left foot which has not responded to antibiotics.  Ulceration of the stump of right leg with bone exposed.  He will need to be admitted for failure to respond to outpatient antibiotics.  I am also concerned about possible osteomyelitis in the stump with the exposed bone present.  X-rays showed demineralization of the bones of the left foot without obvious signs of osteomyelitis, no fracture present in the stump.  Labs are significant for leukocytosis with left shift not significantly  changed from 1/10, chronic anemia not significantly changed from baseline.  There is evidence of an acute kidney injury with creatinine 1.46 compared with 1.35 on 1/10, and 1.06 on 10/29.  Mild hyponatremia is present and not felt to be clinically significant.  Of [name], doxycycline does have the potential to be nephrotoxic and may be contributing to his kidney injury, but significant kidney injury was present prior to starting doxycycline.  Old records are reviewed showing chronic wound clinic management of his sacral decubitus and management of peripheral vascular disease by vascular surgery.  He will need to be evaluated for possible need to revise his right BKA.  He is being sent for MRI to rule out osteomyelitis and is started on doxycycline.  Case is discussed with Dr. Cyd Silence  of [facility] hospital[facility], who agrees to admit the patient.  Final Clinical Impression(s) / ED Diagnoses Final diagnoses:  Cellulitis of left foot  Acute kidney injury (nontraumatic) (HCC)  Ulcer of right thigh, unspecified ulcer stage (HCC)  Normochromic normocytic anemia  Hyponatremia    Rx / DC Orders ED Discharge Orders    None       Delora Fuel, MD [id number] [phone]

## 2020-01-24 NOTE — Consult Note (Addendum)
Hospital Consult    Reason for Consult: Ulceration of the stump of right AKA/ LLE wounds Requesting Physician: Dr. Frederico Hamman MRN #:  [id number]  History of Present Illness: This is a 87 y.o. male who is well known to [facility] for peripheral arterial disease.Vascular surgery has been consulted to see patient regarding LLE cellulitis and Right AKA wound with exposed bone. He has chronic issues with LLE wounds due to venous hypertension and recurrent episodes of cellulitis as well as severe calcification of his arteries. He has no revascularization options on the LLE and this has been discussed with he and his wife on multiple occasions in the past. He is also s/p Right AKA. He has presented to the ER secondary to recurrent LLE cellulitis. He has been trying to manage this as an outpatient with oral antibiotics, but was sent by PCP yesterday to ED for admission and possible IV antibiotics. On examination he was noted to have open wound on the right AKA stump with exposed bone. Xrays were obtained showing some demineralization but not osteomyelitis. MRI has been ordered to evaluate for osteomyelitis as well but this has not yet been completed. He is having significant pain in BLE. Says it is not constant. The Right AKA stump has been hurting for some time but has not had a wound until recently. Unsure exactly how many days. At time of his last office visit there was some anterior bruising but no skin compromise. Regarding the left leg his wife states he was making very good progress with therapy up until 2 weeks ago when infection seemed to have worsened making patient very fatigued and unfocused in therapy.Patient denies fever or chills. Patient is being admitted for further management    Past Medical History:  Diagnosis Date  . Anxiety   . Arthritis    "generalized" (04/04/2017)  . Asthma   . Basal cell carcinoma    "left ear"  . BPH (benign prostatic hyperplasia)    severe; s/p multiple biopsies  .  CAD (coronary artery disease)   . Carotid artery occlusion   . Chronic respiratory failure ([place])   . Chronic rhinitis   . COPD (chronic obstructive pulmonary disease) (HCC)    2L [place] O2  . Diastolic heart failure ([place]) 2019  . Dilation of biliary tract   . Elevated troponin 03/09/2017  . Gallstones   . GERD (gastroesophageal reflux disease)   . History of blood transfusion    "w/his CABG" (04/04/2017)  . History of kidney stones   . Hyperlipidemia   . Hypertension   . On home oxygen therapy    "~ 24/7" (04/04/2017)  . Peripheral vascular disease ([place])   . Pneumonia 2019  . Squamous cell carcinoma of skin 04/23/2013   in situ-Right flank (txpbx)  . Squamous cell carcinoma of skin 01/23/2017   [place]/ in siut- Left ear rim (CX3FU)  . Syncope and collapse   . Ureteral tumor 08/2015   had endoscopic procedure for evaluation, unable to reach for biopsy  . Urinary catheter (Foley) change required     Past Surgical History:  Procedure Laterality Date  . ABDOMINAL AORTOGRAM W/LOWER EXTREMITY Bilateral 06/30/2019   Procedure: ABDOMINAL AORTOGRAM W/LOWER EXTREMITY;  Surgeon: Waynetta Sandy, MD;  Location: [facility];  Service: Cardiovascular;  Laterality: Bilateral;  . AMPUTATION Right 08/11/2019   Procedure: RIGHT ABOVE KNEE AMPUTATION;  Surgeon: Angelia Mould, MD;  Location: [facility];  Service: Vascular;  Laterality: Right;  . BASAL CELL  CARCINOMA EXCISION Left    ear  . CARDIAC CATHETERIZATION     "prior to bypass"  . CATARACT EXTRACTION W/ INTRAOCULAR LENS  IMPLANT, BILATERAL Bilateral   . CORONARY ARTERY BYPASS GRAFT  10/2001   LIMA to LAD, SVG to OM1-2, SVG to RCA and PDA  . LITHOTRIPSY    . PROSTATE BIOPSY  Oct. 2014  . TONSILLECTOMY      Allergies  Allergen Reactions  . Cefdinir Diarrhea and Other (See Comments)    Severe Diarrhea  . Nitrofurantoin Swelling and Other (See Comments)    Hand Swelling  . Sulfa Antibiotics Anaphylaxis and Swelling  .  Sulfonamide Derivatives Swelling and Other (See Comments)    Facial/tongue swelling  . Tape Other (See Comments)    SKIN IS VERY THIN AND TEARS EASILY!!!!! Please do NOT use "plastic" tape- USE PAPER!!  . Ciprofloxacin Itching, Rash and Other (See Comments)    Red itchy hands  . Nitrofurantoin Swelling and Other (See Comments)    Swollen hands  . Amoxicillin Er Other (See Comments)    Frequest urination  . Doxycycline Other (See Comments)    "Felt terrible"  . Levaquin [Levofloxacin] Itching and Rash  . Sertraline Anxiety and Other (See Comments)    Makes the patient jittery    Prior to Admission medications   Medication Sig Start Date End Date Taking? Authorizing Provider  ALPRAZolam Duanne Moron) 0.5 MG tablet Take 1 tablet (0.5 mg total) by mouth 3 (three) times daily. 12/24/19   Tanda Rockers, MD  aspirin 81 MG tablet Take 81 mg by mouth daily.    [provider]  atorvastatin (LIPITOR) 20 MG tablet TAKE 1 TABLET BY MOUTH EVERY DAY Patient taking differently: Take 20 mg by mouth at bedtime. 05/24/16   Belva Crome, MD  calcium carbonate (TUMS - DOSED IN MG ELEMENTAL CALCIUM) 500 MG chewable tablet Chew 1 tablet (200 mg of elemental calcium total) by mouth 2 (two) times daily as needed for indigestion or heartburn. 09/01/19   Love, Ivan Anchors, PA-C  cephALEXin (KEFLEX) 500 MG capsule Take 1 capsule (500 mg total) by mouth 4 (four) times daily. 01/19/20   Jamse Arn, MD  Cholecalciferol (VITAMIN D3) 2000 units TABS Take 2,000 Units by mouth daily with lunch.     [provider]  doxycycline (VIBRAMYCIN) 100 MG capsule Take 1 capsule (100 mg total) by mouth 2 (two) times daily. One po bid x 7 days 01/20/20   Deno Etienne, DO  famotidine (PEPCID) 20 MG tablet Take 20 mg by mouth at bedtime.     [provider]  finasteride (PROSCAR) 5 MG tablet Take 5 mg by mouth daily with lunch.     [provider]  fluticasone (FLONASE) 50 MCG/ACT nasal spray Place 2  sprays into both nostrils daily.    [provider]  furosemide (LASIX) 20 MG tablet Take two tablets by mouth every morning.  Take one tablet by mouth every evening. 12/08/19   Belva Crome, MD  levalbuterol [facility] HFA) 45 MCG/ACT inhaler Inhale 2 puffs into the lungs every 4 (four) hours as needed for wheezing or shortness of breath. 04/14/19   Tanda Rockers, MD  levalbuterol (XOPENEX) 0.63 MG/3ML nebulizer solution Take 3 mLs (0.63 mg total) by nebulization every 4 (four) hours as needed for wheezing or shortness of breath. 10/13/19   Tanda Rockers, MD  levothyroxine (SYNTHROID) 50 MCG tablet Take 50 mcg by mouth daily. 10/27/19  [provider]  metoprolol succinate (TOPROL-XL) 25 MG 24 hr tablet Take 0.5 tablets (12.5 mg total) by mouth daily. 07/11/19   Belva Crome, MD  mometasone (ELOCON) 0.1 % cream Apply topically daily as needed. 11/10/19   [provider]  Multiple Vitamins-Minerals (PRESERVISION AREDS 2) CAPS Take 1 capsule by mouth daily.    [provider]  MYRBETRIQ 25 MG TB24 tablet Take 25 mg by mouth daily. 12/12/19   [provider]  NITROSTAT 0.4 MG SL tablet Place 0.4 mg under the tongue every 5 (five) minutes as needed for chest pain.    [provider]  OXYGEN Inhale 2 L/min into the lungs continuous.     [provider]  pantoprazole (PROTONIX) 40 MG tablet TAKE 1 TABLET(40 MG) BY MOUTH DAILY 30 TO 60 MINUTES BEFORE FIRST MEAL OF THE DAY 10/13/19   Tanda Rockers, MD  Polyethyl Glycol-Propyl Glycol (SYSTANE) 0.4-0.3 % SOLN Apply 1 drop to eye 2 (two) times daily as needed (eye irritation). 09/01/19   Love, Ivan Anchors, PA-C  polyethylene glycol (MIRALAX / GLYCOLAX) 17 g packet Take 17 g by mouth daily. 09/01/19   Love, Ivan Anchors, PA-C  potassium chloride 20 MEQ/15ML (10%) SOLN Take 20 mEq by mouth daily. 10/16/19   [provider]  predniSONE (DELTASONE) 10 MG tablet FOR FLARE OF COUGH/WHEEZING MAY INCREASE  TO 2 DAILY BETTER, THAN 1 TABLET BY MOUTH EVERY DAY 10/13/19   Tanda Rockers, MD  sodium chloride (MURO 128) 5 % ophthalmic solution Place 1 drop into both eyes 4 (four) times daily. 08/22/19   Love, Ivan Anchors, PA-C  spironolactone (ALDACTONE) 25 MG tablet Take 25 mg by mouth daily.    [provider]  SYMBICORT 160-4.5 MCG/ACT inhaler Inhale 2 puffs into the lungs 2 (two) times daily. 06/06/19   Tanda Rockers, MD  Tiotropium Bromide Monohydrate (SPIRIVA RESPIMAT) 2.5 MCG/ACT AERS INHALE 2 PUFFS BY MOUTH EVERY DAY Patient taking differently: Inhale 2 puffs into the lungs daily. 05/14/19   Tanda Rockers, MD  vitamin B-12 (CYANOCOBALAMIN) 1000 MCG tablet Take 1,000 mcg by mouth daily.     [provider]  Zinc Oxide (TRIPLE PASTE) 12.8 % ointment Apply topically as needed for irritation. To gluteal cleft/buttocks 09/01/19   Love, Ivan Anchors, PA-C    Social History   Socioeconomic History  . Marital status: Married    Spouse name: Lennex Rohm  . Number of children: 2  . Years of education: 19  . Highest education level: 12th grade  Occupational History  . 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 Smoking status: Former Smoker    Packs/day: 2.00    Years: 33.00    Pack years: 66.00    Types: Cigarettes    Quit date: 03/25/1982    Years since quitting: 37.8  . Smokeless tobacco: Never Used  Vaping Use  . Vaping Use: Never used  Substance and Sexual Activity  . Alcohol use: Yes    Alcohol/week: 0.0 standard drinks    Comment: 1.5-3 oz daily in the past, minimal use in the last few months  . Drug use: No  . Sexual activity: Not Currently  Other Topics Concern  . Not on file  Social History Narrative   ** Merged History Encounter **       Pt lives at home with his spouse.         Social Determinants of Health  Financial Resource Strain: Low Risk   . Difficulty of Paying Living Expenses: Not hard at all  Food  Insecurity: No Food Insecurity  . Worried About Charity fundraiser in the Last Year: Never true  . Ran Out of Food in the Last Year: Never true  Transportation Needs: No Transportation Needs  . Lack of Transportation (Medical): No  . Lack of Transportation (Non-Medical): No  Physical Activity: Inactive  . Days of Exercise per Week: 0 days  . Minutes of Exercise per Session: 0 min  Stress: No Stress Concern Present  . Feeling of Stress : Only a little  Social Connections: Moderately Integrated  . Frequency of Communication with Friends and Family: More than three times a week  . Frequency of Social Gatherings with Friends and Family: More than three times a week  . Attends Religious Services: More than 4 times per year  . Active Member of Clubs or Organizations: No  . Attends Archivist Meetings: Never  . Marital Status: Married  Human resources officer Violence: Not At Risk  . Fear of Current or Ex-Partner: No  . Emotionally Abused: No  . Physically Abused: No  . Sexually Abused: No    Family History  Problem Relation Age of Onset  . Heart disease Mother   . Heart disease Father        Before age 54  . Breast cancer Mother   . Cancer Mother        Breast cancer  . Hypertension Mother   . Other Mother        AAA  and   Amputation  . Heart attack Father   . Stroke Brother        x3, still living   . Peripheral vascular disease Brother   . Heart disease Brother   . Hyperlipidemia Brother   . Hypertension Brother   . Heart disease Brother   . Allergies Brother     ROS: Otherwise negative unless mentioned in HPI  Physical Examination  Vitals:   01/24/20 0549 01/24/20 0606  BP:  (!) 150/93  Pulse:  89  Resp:  (!) 26  Temp: (!) 97.4 F (36.3 C) (!) 97.5 F (36.4 C)  SpO2:  100%   There is no height or weight on file to calculate BMI.  General:  WDWN in NAD Gait: Not observed HENT: WNL, normocephalic Pulmonary: normal non-labored breathing, without   wheezing Cardiac: regular Abdomen: soft, NT/ND, no masses Vascular Exam/Pulses: 2+ bilateral femoral pulses, no palpable distal pulses LLE Extremities: without ischemic changes, without Gangrene , with cellulitis of left lateral forefoot and toes; with open wound of the distal right AKA stump 2 cm x 2 cm wound with exposed femur. Surrounding ecchymosis present.No erythema or drainage. Left lower extremity and foot edematous Musculoskeletal: no muscle wasting or atrophy  Neurologic: A&O X 3;  No focal weakness or paresthesias are detected; speech is fluent/normal Psychiatric:  The pt has Normal affect. Lymph:  Unremarkable  CBC    Component Value Date/Time   WBC 12.0 (H) 01/23/2020 1957   RBC 3.79 (L) 01/23/2020 1957   HGB 12.2 (L) 01/23/2020 1957   HGB 10.6 (L) 09/22/2019 1556   HCT 36.0 (L) 01/23/2020 1957   HCT 31.7 (L) 09/22/2019 1556   PLT 266 01/23/2020 1957   PLT 258 09/22/2019 1556   MCV 95.0 01/23/2020 1957   MCV 93 09/22/2019 1556   MCH 32.2 01/23/2020 1957   MCHC 33.9 01/23/2020 1957  RDW 13.1 01/23/2020 1957   RDW 12.0 09/22/2019 1556   LYMPHSABS 0.3 (L) 01/23/2020 1957   LYMPHSABS 0.4 (L) 06/25/2019 1449   MONOABS 0.4 01/23/2020 1957   EOSABS 0.0 01/23/2020 1957   EOSABS 0.0 06/25/2019 1449   BASOSABS 0.1 01/23/2020 1957   BASOSABS 0.1 06/25/2019 1449    BMET    Component Value Date/Time   NA 132 (L) 01/23/2020 1957   NA 132 (L) 11/07/2019 1636   K 4.0 01/23/2020 1957   CL 89 (L) 01/23/2020 1957   CO2 32 01/23/2020 1957   GLUCOSE 130 (H) 01/23/2020 1957   BUN 32 (H) 01/23/2020 1957   BUN 23 11/07/2019 1636   CREATININE 1.46 (H) 01/23/2020 1957   CALCIUM 9.4 01/23/2020 1957   GFRNONAA 46 (L) 01/23/2020 1957   GFRAA 72 11/07/2019 1636    COAGS: Lab Results  Component Value Date   INR 1.0 08/11/2019   INR 1.0 06/27/2019   INR 1.06 04/04/2017     Statin:  Yes.   Beta Blocker:  Yes.   Aspirin:  Yes.   ACEI:  No. ARB:  No. CCB use:  No Other  antiplatelets/anticoagulants:  No.    ASSESSMENT/PLAN: This is a 87 y.o. male with peripheral artery disease who is well known to [facility]VS. He additionally has chronic venous insufficiency and has had recurrent episodes of cellulitis of the LLE. He presents with recurrent cellulitis not improving with outpatient management. He has no revascularization options from an arterial standpoint in the LLE and is not agreeable to any amputation on that side. He has been continuing conservative therapy and wishes to do so. He is presently on Vancomycin. MRI is pending for right AKA and left foot to evaluate for osteomyelitis. The right AKA has open wound with exposed femur. Recommend revision of right AKA due to open wound with exposed bone. This could be done tomorrow or Monday. Patient and his wife would like to think about revision of right AKA. Okay to have diet today. Dr. Donzetta Matters, the on call vascular surgeon, will see and evaluate the patient later today to follow up on their decision to proceed and timing of surgery   Karoline Caldwell PA-C [facility] [phone] 01/24/2020  9:13 AM   I have independently interviewed and examined patient and agree with PA assessment and plan above.  Patient well-known to me with previous right AKA.  We have had long discussions in the past with the patient, his wife and his son a Stage manager in [place].  He has been deemed to be not a revascularization candidate for his left lower extremity due to his pulmonary status.  His right above-knee amputation now has exposed femur although MRI is negative for any underlying evidence of infection.  Patient does not want to have amputation of the left lower extremity erythema there appears to be improving.  He is unsure if he would like any surgery at this time.  He wants more time to discuss with his wife and son and think about it.  Probably would benefit from palliative medicine consult.  Ruchel Brandenburger C. Donzetta Matters, MD [facility] [place] Office: [phone] Pager: [phone]

## 2020-01-24 NOTE — Consult Note (Signed)
WOC Nurse Consult Note: Reason for Consult: Right stump wound and partial thickness Stage 2 PI to coccyx.  Patient has been seen by Vascular Surgery who are managing erythematous digits on left foot and right stump.  I will provide Nursing with guidance for a dressing for the right stump wound and the coccyx ulcer. Wound type:Pressure, perfusion, infection Pressure Injury POA: Yes Measurement:To be obtained by Nursing today and documented on Nursing Flow Sheet Wound bed: See photograph uploaded to EMR of left foot and right stump wounds.  Stage 2 coccyx ulcer is described in the nursing flow sheet as pink with a small amount of yellow. Drainage (amount, consistency, odor) scant serous to light yellow from coccyx Periwound:intact Dressing procedure/placement/frequency: Dr. Kyung Bacca has ordered collagenase [facility]) to the coccyx wound twice daily. I decreased the frequency to daily in accordance with standard product use guidelines and provided Nursing with care orders, including guidance for positioning to reduce pressure to and offload the area.  I have provided a topical care order for the right stump full thickness wound and have provided a pressure redistribution heel boot for the LLE.  VVS is managing the patient's LLE and right stump wound.  I defer to their expertise in management of this complex patient.  [facility] nursing team will not follow, but will remain available to this patient, the nursing and medical teams.  Please re-consult if needed. Thanks, Maudie Flakes, MSN, RN, [place], Arther Abbott  Pager# [phone]

## 2020-01-24 NOTE — Progress Notes (Addendum)
Pharmacy Antibiotic Note  [name] is a 87 y.o. male admitted on 01/23/2020 with cellulitis.  Pharmacy has been consulted for Vancomycin dosing.      Temp (24hrs), Avg:97.8 F (36.6 C), Min:97.4 F (36.3 C), Max:98.2 F (36.8 C)  Recent Labs  Lab 01/19/20 2014 01/20/20 0125 01/23/20 1957  WBC 14.5*  --  12.0*  CREATININE 1.35*  --  1.46*  LATICACIDVEN 1.7 1.6 1.6    CrCl cannot be calculated (Unknown ideal weight.).    Allergies  Allergen Reactions  . Cefdinir Diarrhea and Other (See Comments)    Severe Diarrhea  . Nitrofurantoin Swelling and Other (See Comments)    Hand Swelling  . Sulfa Antibiotics Anaphylaxis and Swelling  . Sulfonamide Derivatives Swelling and Other (See Comments)    Facial/tongue swelling  . Tape Other (See Comments)    SKIN IS VERY THIN AND TEARS EASILY!!!!! Please do NOT use "plastic" tape- USE PAPER!!  . Ciprofloxacin Itching, Rash and Other (See Comments)    Red itchy hands  . Nitrofurantoin Swelling and Other (See Comments)    Swollen hands  . Amoxicillin Er Other (See Comments)    Frequest urination  . Doxycycline Other (See Comments)    "Felt terrible"  . Levaquin [Levofloxacin] Itching and Rash  . Sertraline Anxiety and Other (See Comments)    Makes the patient jittery    Antimicrobials this admission: 1/15 Vancomycin >>   Dose adjustments this admission: N/a  Microbiology results: Pending   Plan:  - Vancomycin 1000mg  IV x 1 dose given by ED - Will follow with Vancomycin 750mg  IV q24h - Est Calc AUC 441 - Monitor patients renal function and urine output  - De-escalate ABX when appropriate   Thank you for allowing pharmacy to be a part of this patient's care.  Duanne Limerick PharmD. BCPS 01/24/2020 11:19 AM

## 2020-01-24 NOTE — Progress Notes (Signed)
RT Note: Patient refused Incruse & Breo inhalers. States he uses Symbicort & Spriva at home. Family will bring inhalers from home.

## 2020-01-25 DIAGNOSIS — T8781 Dehiscence of amputation stump: Secondary | ICD-10-CM | POA: Diagnosis not present

## 2020-01-25 DIAGNOSIS — J9611 Chronic respiratory failure with hypoxia: Secondary | ICD-10-CM | POA: Diagnosis not present

## 2020-01-25 DIAGNOSIS — L03116 Cellulitis of left lower limb: Secondary | ICD-10-CM | POA: Diagnosis not present

## 2020-01-25 DIAGNOSIS — J9612 Chronic respiratory failure with hypercapnia: Secondary | ICD-10-CM

## 2020-01-25 DIAGNOSIS — N179 Acute kidney failure, unspecified: Secondary | ICD-10-CM | POA: Diagnosis not present

## 2020-01-25 DIAGNOSIS — I739 Peripheral vascular disease, unspecified: Secondary | ICD-10-CM | POA: Diagnosis not present

## 2020-01-25 LAB — COMPREHENSIVE METABOLIC PANEL
ALT: 33 U/L (ref 0–44)
AST: 23 U/L (ref 15–41)
Albumin: 3.2 g/dL — ABNORMAL LOW (ref 3.5–5.0)
Alkaline Phosphatase: 55 U/L (ref 38–126)
Anion gap: 11 (ref 5–15)
BUN: 35 mg/dL — ABNORMAL HIGH (ref 8–23)
CO2: 28 mmol/L (ref 22–32)
Calcium: 8.9 mg/dL (ref 8.9–10.3)
Chloride: 92 mmol/L — ABNORMAL LOW (ref 98–111)
Creatinine, Ser: 1.25 mg/dL — ABNORMAL HIGH (ref 0.61–1.24)
GFR, Estimated: 55 mL/min — ABNORMAL LOW (ref >60)
Glucose, Bld: 97 mg/dL (ref 70–99)
Potassium: 3.8 mmol/L (ref 3.5–5.1)
Sodium: 131 mmol/L — ABNORMAL LOW (ref 135–145)
Total Bilirubin: 1 mg/dL (ref 0.3–1.2)
Total Protein: 5.2 g/dL — ABNORMAL LOW (ref 6.5–8.1)

## 2020-01-25 LAB — CBC
HCT: 34.5 % — ABNORMAL LOW (ref 39.0–52.0)
Hemoglobin: 11.3 g/dL — ABNORMAL LOW (ref 13.0–17.0)
MCH: 31.1 pg (ref 26.0–34.0)
MCHC: 32.8 g/dL (ref 30.0–36.0)
MCV: 95 fL (ref 80.0–100.0)
Platelets: 215 10*3/uL (ref 150–400)
RBC: 3.63 MIL/uL — ABNORMAL LOW (ref 4.22–5.81)
RDW: 13.1 % (ref 11.5–15.5)
WBC: 9.7 10*3/uL (ref 4.0–10.5)
nRBC: 0 % (ref 0.0–0.2)

## 2020-01-25 MED ORDER — TIOTROPIUM BROMIDE MONOHYDRATE 2.5 MCG/ACT IN AERS: 2.0000 | INHALATION_SPRAY | Freq: Every day | RESPIRATORY_TRACT | Status: DC

## 2020-01-25 MED ORDER — FUROSEMIDE 20 MG PO TABS
30.0000 mg | ORAL_TABLET | Freq: Two times a day (BID) | ORAL | Status: DC
  Administered 2020-01-25: 30 mg via ORAL
  Filled 2020-01-25: qty 2

## 2020-01-25 MED ORDER — UMECLIDINIUM BROMIDE 62.5 MCG/INH IN AEPB
1.0000 | INHALATION_SPRAY | Freq: Every day | RESPIRATORY_TRACT | Status: DC
  Filled 2020-01-25: qty 7

## 2020-01-25 MED ORDER — ALBUMIN HUMAN 5 % IV SOLN
12.5000 g | Freq: Once | INTRAVENOUS | Status: AC
  Administered 2020-01-26: 12.5 g via INTRAVENOUS
  Filled 2020-01-25: qty 250

## 2020-01-25 MED ORDER — PREDNISONE 10 MG PO TABS
10.0000 mg | ORAL_TABLET | Freq: Every day | ORAL | Status: DC
Start: 2020-01-25 — End: 2020-01-31
  Administered 2020-01-25 – 2020-01-30 (×5): 10 mg via ORAL
  Filled 2020-01-25 (×5): qty 1

## 2020-01-25 MED ORDER — METOPROLOL TARTRATE 5 MG/5ML IV SOLN
2.5000 mg | INTRAVENOUS | Status: DC | PRN
  Administered 2020-01-25 (×2): 2.5 mg via INTRAVENOUS
  Filled 2020-01-25 (×3): qty 5

## 2020-01-25 MED ORDER — FLUTICASONE FUROATE-VILANTEROL 200-25 MCG/INH IN AEPB
1.0000 | INHALATION_SPRAY | Freq: Every day | RESPIRATORY_TRACT | Status: DC
  Filled 2020-01-25: qty 28

## 2020-01-25 MED ORDER — SODIUM CHLORIDE 0.9 % IV BOLUS
500.0000 mL | Freq: Once | INTRAVENOUS | Status: AC
  Administered 2020-01-25: 500 mL via INTRAVENOUS

## 2020-01-25 MED ORDER — ALBUTEROL SULFATE (5 MG/ML) 0.5% IN NEBU: 2.5000 mg | INHALATION_SOLUTION | RESPIRATORY_TRACT | Status: DC | PRN

## 2020-01-25 NOTE — Progress Notes (Signed)
  Progress Note    01/25/2020 3:25 PM  Subjective: Not really having any pain but feeling more groggy this morning  Vitals:   01/25/20 0506 01/25/20 0929  BP: (!) 85/51 101/63  Pulse: 81 84  Resp: 18 18  Temp: 97.8 F (36.6 C) 97.8 F (36.6 C)  SpO2: 100% 100%    Physical Exam: He is awake alert and oriented on my exam Left foot edema and erythema have mostly resolved Right above-knee amputation with exposed bone  CBC    Component Value Date/Time   WBC 9.7 01/25/2020 0147   RBC 3.63 (L) 01/25/2020 0147   HGB 11.3 (L) 01/25/2020 0147   HGB 10.6 (L) 09/22/2019 1556   HCT 34.5 (L) 01/25/2020 0147   HCT 31.7 (L) 09/22/2019 1556   PLT 215 01/25/2020 0147   PLT 258 09/22/2019 1556   MCV 95.0 01/25/2020 0147   MCV 93 09/22/2019 1556   MCH 31.1 01/25/2020 0147   MCHC 32.8 01/25/2020 0147   RDW 13.1 01/25/2020 0147   RDW 12.0 09/22/2019 1556   LYMPHSABS 0.3 (L) 01/23/2020 1957   LYMPHSABS 0.4 (L) 06/25/2019 1449   MONOABS 0.4 01/23/2020 1957   EOSABS 0.0 01/23/2020 1957   EOSABS 0.0 06/25/2019 1449   BASOSABS 0.1 01/23/2020 1957   BASOSABS 0.1 06/25/2019 1449    BMET    Component Value Date/Time   NA 131 (L) 01/25/2020 0147   NA 132 (L) 11/07/2019 1636   K 3.8 01/25/2020 0147   CL 92 (L) 01/25/2020 0147   CO2 28 01/25/2020 0147   GLUCOSE 97 01/25/2020 0147   BUN 35 (H) 01/25/2020 0147   BUN 23 11/07/2019 1636   CREATININE 1.25 (H) 01/25/2020 0147   CALCIUM 8.9 01/25/2020 0147   GFRNONAA 55 (L) 01/25/2020 0147   GFRAA 72 11/07/2019 1636    INR    Component Value Date/Time   INR 1.0 08/11/2019 0715     Intake/Output Summary (Last 24 hours) at 01/25/2020 1525 Last data filed at 01/25/2020 0606 Gross per 24 hour  Intake 1102.52 ml  Output 800 ml  Net 302.52 ml     Assessment/plan:  87 y.o. male is here with exposed femur and his previous right above-knee amputation.  We discussed his options being revision to a higher level versus continued  antibiotics and more of a palliative route.  I have also discussed the patient's case with his son, radiologist and [facility] and he is aware of all of the medical issues and the complexity of the problem.  He is going to talk with his father and mother today and we will make a decision in the next day or 2 regarding possible amputation revision.   Natividad Halls C. Donzetta Matters, MD [facility] [place] Office: [phone] Pager: [phone]  01/25/2020 3:25 PM

## 2020-01-25 NOTE — Progress Notes (Addendum)
   01/25/20 1724  Assess: MEWS Score  Temp 97.8 F (36.6 C)  BP (!) 89/58  Pulse Rate (!) 105  Resp 18  SpO2 99 %  O2 Device Nasal Cannula  O2 Flow Rate (L/min) 2 L/min  Assess: MEWS Score  MEWS Temp 0  MEWS Systolic 1  MEWS Pulse 1  MEWS RR 0  MEWS LOC 0  MEWS Score 2  MEWS Score Color Yellow  Assess: if the MEWS score is Yellow or Red  Were vital signs taken at a resting state? Yes  Focused Assessment Change from prior assessment (see assessment flowsheet)  Early Detection of Sepsis Score *See Row Information* Medium  MEWS guidelines implemented *See Row Information* Yes  Treat  MEWS Interventions Administered prn meds/treatments;Escalated (See documentation below)  Take Vital Signs  Increase Vital Sign Frequency  Yellow: Q 2hr X 2 then Q 4hr X 2, if remains yellow, continue Q 4hrs  Escalate  MEWS: Escalate Yellow: discuss with charge nurse/RN and consider discussing with provider and RRT  Notify: Charge Nurse/RN  Name of Charge Nurse/RN Notified Manuela Schwartz RN  Date Charge Nurse/RN Notified 01/25/20  Time Charge Nurse/RN Notified 1851  Notify: Provider  Provider Name/Title Starla Link MD  Date Provider Notified 01/25/20  Time Provider Notified 1730  Notification Type Page  Notification Reason Change in status  Response See new orders  Date of Provider Response 01/31/20  Time of Provider Response 1745  Patient HR tachycardic sustaining 120s-130s with blood pressure soft 89/58, otherwise all other VSS at 1724 with no cardiac complaints reported by the patient.  Alekh MD notified of current situation and ordered PRN lopressor 2.5mg  IV Q4h and orders for patient to be placed on telemetry monitor.  After administration of PRN lopressor HR slightly improved ranging from 90s-110s, but within the hour returned to previous range of HR 120s-130s reaching a high of 140 non-sustaining.  MD Starla Link made aware of updates regarding patients tachycardia with orders for 1x IV bolus and additional dose  of IV metoprolol. Will admin and re-assess.

## 2020-01-25 NOTE — Evaluation (Signed)
Physical Therapy Evaluation Patient Details Name: [name] MRN: [id number] DOB: [birth year] Today's Date: 01/25/2020   History of Present Illness  Patient is a 87 y/o male who presents with Left foot cellulitis with failed outpatient antibiotics as well as Rt AKa stump wound with bone exposed. Awaiting discussion with vascular regarding surgery?  PMH includes COPD on home 02, CAD s/p CABG, CHF, PVD, PAF, HTN, HLD, depression/anxiety, HOH, Rt AKA.  Clinical Impression  Patient presents with generalized weakness, deconditioning, impaired balance and impaired mobility s/p above. Pt lives at home with wife and gets some assist with ADLs, uses w/c for mobility and able to transfer independently PTA. Has been working with HHPT and using RW for minimal ambulation with assist. Today, pt tolerated SPT to chair with Min guard assist for safety. Able to perform multiple half stands to "test the leg out." Will be meeting with MD to discuss if surgery is what the pt wants to move forward with or go towards hospice care. Wife is supportive. Pt is very HOH but hears better in right ear. Able to recall some there ex from HHPT and performed some exercise sitting in chair. Will follow acutely to maximize independence and mobility prior to return home.    Follow Up Recommendations Home health PT;Supervision for mobility/OOB (continue HHPT)    Equipment Recommendations  None recommended by PT    Recommendations for Other Services       Precautions / Restrictions Precautions Precautions: Fall Precaution Comments: Rt AKA Required Braces or Orthoses: Other Brace Other Brace: post op shoe LLE Restrictions Weight Bearing Restrictions: No      Mobility  Bed Mobility Overal bed mobility: Needs Assistance Bed Mobility: Supine to Sit     Supine to sit: Supervision;HOB elevated     General bed mobility comments: No assist needed, increased time.    Transfers Overall transfer level: Needs  assistance Equipment used: None Transfers: Stand Pivot Transfers   Stand pivot transfers: Min guard       General transfer comment: SPT bed to chair with chair positioned in front of pt per request to simulate home transfers. Performed multiple half stands "to test my leg out."  Ambulation/Gait             General Gait Details: Deferred  Stairs            Wheelchair Mobility    Modified Rankin (Stroke Patients Only)       Balance Overall balance assessment: History of Falls;Needs assistance Sitting-balance support: No upper extremity supported (foot supported) Sitting balance-Leahy Scale: Fair                                       Pertinent Vitals/Pain Pain Assessment: No/denies pain    Home Living Family/patient expects to be discharged to:: Private residence Living Arrangements: Spouse/significant other Available Help at Discharge: Family;Available 24 hours/day Type of Home: House Home Access: Level entry     Home Layout: Multi-level;Bed/bath upstairs Home Equipment: Walker - 2 wheels;Cane - single point;Shower seat;Transport chair;Tub bench Additional Comments: O2 2L    Prior Function Level of Independence: Needs assistance   Gait / Transfers Assistance Needed: independent with transfers to the w/c, uses RW when working with PT -walking 10 steps with chair follow; independent with w/c mobility. has w/c on 2 different floors. 2 falls on thanksgiving and xmas  ADL's / Homemaking Assistance Needed: wife  has been assiting with bathing and dressing  Comments: Has been working with PT for [place]   Dominant Hand: Right    Extremity/Trunk Assessment   Upper Extremity Assessment Upper Extremity Assessment: Defer to OT evaluation    Lower Extremity Assessment Lower Extremity Assessment: RLE deficits/detail;LLE deficits/detail RLE Deficits / Details: Bandage intact on right residual limb LLE Deficits / Details:  Redness present distal LLE and into foot; small sore on heel LLE Sensation: decreased light touch       Communication   Communication: HOH  Cognition Arousal/Alertness: Awake/alert Behavior During Therapy: WFL for tasks assessed/performed Overall Cognitive Status: Within Functional Limits for tasks assessed                                 General Comments: Appears WFL, however pt very HOH.      General Comments General comments (skin integrity, edema, etc.): Wife present during session.    Exercises General Exercises - Lower Extremity Long Arc Quad: AROM;Left;10 reps;Seated Hip Flexion/Marching: AROM;Left;10 reps;Seated   Assessment/Plan    PT Assessment Patient needs continued PT services  PT Problem List Decreased strength;Decreased mobility;Decreased skin integrity;Cardiopulmonary status limiting activity;Decreased balance;Impaired sensation       PT Treatment Interventions Therapeutic exercise;Gait training;Balance training;Neuromuscular re-education;Functional mobility training;Therapeutic activities;Patient/family education    PT Goals (Current goals can be found in the Care Plan section)  Acute Rehab PT Goals Patient Stated Goal: to get better PT Goal Formulation: With patient Time For Goal Achievement: 02/08/20 Potential to Achieve Goals: Good    Frequency Min 3X/week   Barriers to discharge        Co-evaluation               AM-PAC PT "6 Clicks" Mobility  Outcome Measure Help needed turning from your back to your side while in a flat bed without using bedrails?: A Little Help needed moving from lying on your back to sitting on the side of a flat bed without using bedrails?: A Little Help needed moving to and from a bed to a chair (including a wheelchair)?: A Little Help needed standing up from a chair using your arms (e.g., wheelchair or bedside chair)?: A Little Help needed to walk in hospital room?: A Little Help needed climbing 3-5  steps with a railing? : A Little 6 Click Score: 18    End of Session Equipment Utilized During Treatment: Oxygen Activity Tolerance: Patient tolerated treatment well Patient left: in chair;with call bell/phone within reach;with nursing/sitter in room;with family/visitor present (malfunctioning chair alarm, placed under pt but going off non stop) Nurse Communication: Mobility status PT Visit Diagnosis: Unsteadiness on feet (R26.81);Difficulty in walking, not elsewhere classified (R26.2);Muscle weakness (generalized) (M62.81)    Time: 8657-8469 PT Time Calculation (min) (ACUTE ONLY): 29 min   Charges:   PT Evaluation $PT Eval Moderate Complexity: 1 Mod PT Treatments $Therapeutic Activity: 8-22 mins        Marisa Severin, PT, DPT Acute Rehabilitation Services Pager [phone] Office [phone]      Marguarite Arbour A Sabra Heck 01/25/2020, 1:23 PM

## 2020-01-25 NOTE — Plan of Care (Addendum)
  BP 90/58 @2350 , Dr. Kennon Holter notified, no new orders. Pt stable states he believes it is from the lasix earlier today.   BP 85/51 @0506 , Dr. Kennon Holter notified, 575ml/hr bolus ordered.  Problem: Education: Goal: Knowledge of General Education information will improve Description: Including pain rating scale, medication(s)/side effects and non-pharmacologic comfort measures Outcome: Progressing   Problem: Activity: Goal: Risk for activity intolerance will decrease Outcome: Progressing   Problem: Pain Managment: Goal: General experience of comfort will improve Outcome: Progressing   Problem: Safety: Goal: Ability to remain free from injury will improve Outcome: Progressing   Problem: Skin Integrity: Goal: Risk for impaired skin integrity will decrease Outcome: Progressing

## 2020-01-25 NOTE — Progress Notes (Signed)
Patient ID: [name], male   DOB: April 22, 1929, 87 y.o.   MRN: [id number]   PROGRESS NOTE    [name]  [id number] DOB: 1929/05/11 DOA: 01/23/2020 PCP: [name], MD   Brief Narrative:  87 year old male with history of hypertension, hyperlipidemia, COPD, chronic hypoxic respiratory failure requiring 2 L/min nasal cannula as well as trilogy, chronic diastolic heart failure, status post right AKA presented with worsening left foot pain and redness not improving with outpatient doxycycline. On presentation, he had left foot redness going towards the ankle and was started on IV antibiotics.  Assessment & Plan:   Left foot cellulitis with failed outpatient antibiotics -Patient failed outpatient doxycycline. MRI of the foot does not show any signs of osteomyelitis. Continue vancomycin. Cellulitis slightly improving.  Right AKA stump wound Peripheral vascular disease -Vascular surgery following. MRI did not show any osteomyelitis of the right AKA site. -Patient unsure if he would like any surgery at this time. Palliative care recommendation by vascular surgery team. We will consult palliative care. -Continue wound care  Leukocytosis -Resolved  Chronic hypoxic respiratory failure COPD -Uses trilogy and oxygen via nasal cannula 2 L/min -Respiratory status currently stable. Continue nebs as needed  Hypertension -Blood pressure on the lower side. Use current antihypertensives with holding parameters  Chronic diastolic heart failure -Strict input and output. Daily weights. Fluid restriction. Continue metoprolol if blood pressure allows. Hold spironolactone and Lasix for now because of acute kidney injury. Outpatient follow-up with cardiology.  Acute kidney injury -Hold spironolactone and Lasix for now. Presented with creatinine of 1.46. Creatinine 1.25 today.  Hyponatremia -Monitor  Generalized deconditioning -PT eval. Palliative care consultation  Stage II coccygeal  pressure ulcer: Present on admission -Continue local wound care  DVT prophylaxis: Lovenox Code Status: Partial with no intubation Family Communication: Wife at bedside Disposition Plan: Status is: Inpatient  Remains inpatient appropriate because:Inpatient level of care appropriate due to severity of illness   Dispo: The patient is from: Home              Anticipated d/c is to: Home              Anticipated d/c date is: 2 days              Patient currently is not medically stable to d/c.  Consultants: Vascular surgery.  Procedures: None  Antimicrobials: Vancomycin from 01/24/2020 onwards   Subjective: Patient seen and examined at bedside. Sleepy, wakes up slightly, poor historian. Wife states that the redness is improving slightly in his left lower extremity. No overnight fever or vomiting reported  Objective: Vitals:   01/24/20 1938 01/24/20 2350 01/25/20 0506 01/25/20 0929  BP: (!) 81/59 (!) 90/58 (!) 85/51 101/63  Pulse: 83 98 81 84  Resp: 18 18 18 18   Temp: 98.3 F (36.8 C) 98.4 F (36.9 C) 97.8 F (36.6 C) 97.8 F (36.6 C)  TempSrc:  Oral  Oral  SpO2: 98% 97% 100% 100%    Intake/Output Summary (Last 24 hours) at 01/25/2020 1021 Last data filed at 01/25/2020 0606 Gross per 24 hour  Intake 1102.52 ml  Output 800 ml  Net 302.52 ml   There were no vitals filed for this visit.  Examination:  General exam: Appears calm and comfortable. Elderly male lying in bed. Respiratory system: Bilateral decreased breath sounds at bases with scattered crackles Cardiovascular system: S1 & S2 heard, Rate controlled Gastrointestinal system: Abdomen is nondistended, soft and nontender. Normal bowel sounds heard. Extremities: No  cyanosis, clubbing; left foot erythema extending up towards the ankle; no open wounds. Right AKA with wound at the stump present Central nervous system: Awake, slow to respond, poor historian. No focal neurological deficits. Moving extremities Skin: No  other ecchymosis/lesions Psychiatry: Flat affect    Data Reviewed: I have personally reviewed following labs and imaging studies  CBC: Recent Labs  Lab 01/19/20 2014 01/23/20 1957 01/24/20 1546 01/25/20 0147  WBC 14.5* 12.0* 11.0* 9.7  NEUTROABS 13.4* 11.1*  --   --   HGB 12.5* 12.2* 11.8* 11.3*  HCT 37.6* 36.0* 37.0* 34.5*  MCV 97.2 95.0 96.6 95.0  PLT 305 266 244 660   Basic Metabolic Panel: Recent Labs  Lab 01/19/20 2014 01/23/20 1957 01/24/20 1546 01/25/20 0147  NA 135 132*  --  131*  K 5.0 4.0  --  3.8  CL 93* 89*  --  92*  CO2 32 32  --  28  GLUCOSE 133* 130*  --  97  BUN 33* 32*  --  35*  CREATININE 1.35* 1.46* 1.33* 1.25*  CALCIUM 9.6 9.4  --  8.9   GFR: CrCl cannot be calculated (Unknown ideal weight.). Liver Function Tests: Recent Labs  Lab 01/19/20 2014 01/23/20 1957 01/25/20 0147  AST 37 29 23  ALT 60* 46* 33  ALKPHOS 60 61 55  BILITOT 0.8 0.9 1.0  PROT 6.5 6.3* 5.2*  ALBUMIN 4.3 3.9 3.2*   No results for input(s): LIPASE, AMYLASE in the last 168 hours. No results for input(s): AMMONIA in the last 168 hours. Coagulation Profile: No results for input(s): INR, PROTIME in the last 168 hours. Cardiac Enzymes: No results for input(s): CKTOTAL, CKMB, CKMBINDEX, TROPONINI in the last 168 hours. BNP (last 3 results) No results for input(s): PROBNP in the last 8760 hours. HbA1C: No results for input(s): HGBA1C in the last 72 hours. CBG: No results for input(s): GLUCAP in the last 168 hours. Lipid Profile: No results for input(s): CHOL, HDL, LDLCALC, TRIG, CHOLHDL, LDLDIRECT in the last 72 hours. Thyroid Function Tests: No results for input(s): TSH, T4TOTAL, FREET4, T3FREE, THYROIDAB in the last 72 hours. Anemia Panel: No results for input(s): VITAMINB12, FOLATE, FERRITIN, TIBC, IRON, RETICCTPCT in the last 72 hours. Sepsis Labs: Recent Labs  Lab 01/19/20 2014 01/20/20 0125 01/23/20 1957  LATICACIDVEN 1.7 1.6 1.6    Recent Results (from  the past 240 hour(s))  SARS CORONAVIRUS 2 (TAT 6-24 HRS) Nasopharyngeal Nasopharyngeal Swab     Status: None   Collection Time: 01/24/20  6:30 AM   Specimen: Nasopharyngeal Swab  Result Value Ref Range Status   SARS Coronavirus 2 NEGATIVE NEGATIVE Final    Comment: (NOTE) SARS-CoV-2 target nucleic acids are NOT DETECTED.  The SARS-CoV-2 RNA is generally detectable in upper and lower respiratory specimens during the acute phase of infection. Negative results do not preclude SARS-CoV-2 infection, do not rule out co-infections with other pathogens, and should not be used as the sole basis for treatment or other patient management decisions. Negative results must be combined with clinical observations, patient history, and epidemiological information. The expected result is Negative.  Fact Sheet for Patients: SugarRoll.be  Fact Sheet for Healthcare Providers: https://www.woods-mathews.com/  This test is not yet approved or cleared by the [place] FDA and  has been authorized for detection and/or diagnosis of SARS-CoV-2 by FDA under an Emergency Use Authorization (EUA). This EUA will remain  in effect (meaning this test can be used) for the duration of the COVID-19 declaration under  Se ction 564(b)(1) of the Act, 21 U.S.C. section 360bbb-3(b)(1), unless the authorization is terminated or revoked sooner.  Performed at [facility], [place] [address] [place], [place] [address]   Culture, blood (routine x 2)     Status: None (Preliminary result)   Collection Time: 01/24/20  3:46 PM   Specimen: BLOOD  Result Value Ref Range Status   Specimen Description BLOOD LEFT ANTECUBITAL  Final   Special Requests   Final    BOTTLES DRAWN AEROBIC AND ANAEROBIC Blood Culture adequate volume   Culture   Final    NO GROWTH < 24 HOURS Performed at [facility], [place] [address] [place], [place] [address]    Report Status PENDING  Incomplete   Culture, blood (routine x 2)     Status: None (Preliminary result)   Collection Time: 01/24/20  3:48 PM   Specimen: BLOOD  Result Value Ref Range Status   Specimen Description BLOOD RIGHT ANTECUBITAL  Final   Special Requests   Final    BOTTLES DRAWN AEROBIC AND ANAEROBIC Blood Culture results may not be optimal due to an inadequate volume of blood received in culture bottles   Culture   Final    NO GROWTH < 24 HOURS Performed at [facility], [place] [address], [place],  [address]    Report Status PENDING  Incomplete         Radiology Studies: DG Knee 2 Views Right  Result Date: 01/23/2020 CLINICAL DATA:  Prior right above the knee amputation. EXAM: RIGHT KNEE - 1-2 VIEW COMPARISON:  None. FINDINGS: A right-sided above the knee amputation is seen without evidence of acute fracture. Marked severity vascular calcification is noted. IMPRESSION: Right-sided above the knee amputation without evidence of acute osseous abnormality. Electronically Signed   By: Virgina Norfolk M.D.   On: 01/23/2020 20:20   MR FEMUR RIGHT W WO CONTRAST  Result Date: 01/24/2020 CLINICAL DATA:  Left lower extremity wounds EXAM: MRI OF THE RIGHT FEMUR WITHOUT AND WITH CONTRAST TECHNIQUE: Multiplanar, multisequence MR imaging of the right was performed both before and after administration of intravenous contrast. CONTRAST:  62mL GADAVIST GADOBUTROL 1 MMOL/ML IV SOLN COMPARISON:  None. FINDINGS: Bones/Joint/Cartilage The patient is status post above the knee amputation. At the amputation site no definite areas of abnormal marrow signal or cortical destruction. No large hip joint effusions are seen. Ligaments Suboptimally visualized Muscles and Tendons There is increased feathery signal seen within the tensor fascia lata and the vastus lateralis muscle belly. The tendons appear to be intact. Soft tissues Mild subcutaneous edema seen along the lateral aspect of the hip. There is also mild edema seen along the  amputation site. No loculated fluid collection is seen. For IMPRESSION: Mild soft tissue edema seen at the amputation site without evidence of soft tissue abscess or osteomyelitis. Electronically Signed   By: Prudencio Pair M.D.   On: 01/24/2020 19:34   MR FOOT LEFT W WO CONTRAST  Result Date: 01/24/2020 CLINICAL DATA:  Question of osteomyelitis, left lower extremity wounds EXAM: MRI OF THE LEFT FOREFOOT WITHOUT AND WITH CONTRAST TECHNIQUE: Multiplanar, multisequence MR imaging of the left was performed both before and after administration of intravenous contrast. CONTRAST:  93mL GADAVIST GADOBUTROL 1 MMOL/ML IV SOLN COMPARISON:  None. FINDINGS: Bones/Joint/Cartilage Mildly increased T2 hyperintense signal seen at the distal tuft of the first digit. No C8-T1 hypointensity or areas of abnormal enhancement are noted. No areas of cortical destruction or periosteal reaction.  Mild first MTP joint osteoarthritis is seen. No large joint effusions are noted. Ligaments The Lisfranc and collateral ligaments are intact. Muscles and Tendons Mildly increased T2 hyperintense signal seen within the muscles surrounding the forefoot appear no areas of focal atrophy or tear. The flexor and extensor tendons are intact. Soft tissues Dorsal subcutaneous edema and mild skin thickening are noted a. This diffusely surround the midfoot and hindfoot. No loculated fluid collections are noted. IMPRESSION: Diffuse subcutaneous edema surrounding the midfoot and hindfoot which could be due to cellulitis. No definite evidence of osteomyelitis. Mildly reactive marrow within the distal tuft of the first digit. Electronically Signed   By: Prudencio Pair M.D.   On: 01/24/2020 19:28   DG Foot Complete Left  Result Date: 01/23/2020 CLINICAL DATA:  Left foot swelling. EXAM: LEFT FOOT - COMPLETE 3+ VIEW COMPARISON:  None. FINDINGS: There is no evidence of fracture or dislocation. There is no evidence of arthropathy or other focal bone abnormality.  There is marked severity diffuse soft tissue swelling, most prominent along the dorsal aspect of the left foot. Mild vascular calcification is seen. IMPRESSION: Marked severity diffuse soft tissue swelling, without an acute osseous abnormality. Electronically Signed   By: Virgina Norfolk M.D.   On: 01/23/2020 20:21        Scheduled Meds: . ALPRAZolam  0.5 mg Oral TID  . aspirin EC  81 mg Oral Daily  . atorvastatin  20 mg Oral QHS  . cholecalciferol  2,000 Units Oral Q lunch  . collagenase   Topical Daily  . enoxaparin (LOVENOX) injection  30 mg Subcutaneous Q24H  . finasteride  5 mg Oral Q lunch  . fluticasone  2 spray Each Nare Daily  . furosemide  30 mg Oral BID  . levothyroxine  50 mcg Oral Daily  . metoprolol succinate  12.5 mg Oral Daily  . mirabegron ER  25 mg Oral Daily  . multivitamin  1 tablet Oral Daily  . pantoprazole  40 mg Oral Daily  . polyethylene glycol  17 g Oral Daily  . potassium chloride  20 mEq Oral Daily  . spironolactone  25 mg Oral Daily  . vitamin B-12  1,000 mcg Oral Daily   Continuous Infusions: . vancomycin            Aline August, MD [facility] 01/25/2020, 10:21 AM

## 2020-01-26 DIAGNOSIS — I25119 Atherosclerotic heart disease of native coronary artery with unspecified angina pectoris: Secondary | ICD-10-CM | POA: Diagnosis not present

## 2020-01-26 DIAGNOSIS — Z7189 Other specified counseling: Secondary | ICD-10-CM

## 2020-01-26 DIAGNOSIS — J9611 Chronic respiratory failure with hypoxia: Secondary | ICD-10-CM | POA: Diagnosis not present

## 2020-01-26 DIAGNOSIS — Z515 Encounter for palliative care: Secondary | ICD-10-CM

## 2020-01-26 DIAGNOSIS — I739 Peripheral vascular disease, unspecified: Secondary | ICD-10-CM

## 2020-01-26 DIAGNOSIS — L89152 Pressure ulcer of sacral region, stage 2: Secondary | ICD-10-CM | POA: Diagnosis not present

## 2020-01-26 DIAGNOSIS — Z89611 Acquired absence of right leg above knee: Secondary | ICD-10-CM

## 2020-01-26 DIAGNOSIS — I5032 Chronic diastolic (congestive) heart failure: Secondary | ICD-10-CM | POA: Diagnosis not present

## 2020-01-26 DIAGNOSIS — L03116 Cellulitis of left lower limb: Secondary | ICD-10-CM | POA: Diagnosis not present

## 2020-01-26 DIAGNOSIS — R627 Adult failure to thrive: Secondary | ICD-10-CM

## 2020-01-26 DIAGNOSIS — R531 Weakness: Secondary | ICD-10-CM

## 2020-01-26 LAB — CBC WITH DIFFERENTIAL/PLATELET
Abs Immature Granulocytes: 0.16 10*3/uL — ABNORMAL HIGH (ref 0.00–0.07)
Basophils Absolute: 0 10*3/uL (ref 0.0–0.1)
Basophils Relative: 0 %
Eosinophils Absolute: 0 10*3/uL (ref 0.0–0.5)
Eosinophils Relative: 0 %
HCT: 31 % — ABNORMAL LOW (ref 39.0–52.0)
Hemoglobin: 10 g/dL — ABNORMAL LOW (ref 13.0–17.0)
Immature Granulocytes: 2 %
Lymphocytes Relative: 4 %
Lymphs Abs: 0.4 10*3/uL — ABNORMAL LOW (ref 0.7–4.0)
MCH: 30.9 pg (ref 26.0–34.0)
MCHC: 32.3 g/dL (ref 30.0–36.0)
MCV: 95.7 fL (ref 80.0–100.0)
Monocytes Absolute: 0.7 10*3/uL (ref 0.1–1.0)
Monocytes Relative: 7 %
Neutro Abs: 8.7 10*3/uL — ABNORMAL HIGH (ref 1.7–7.7)
Neutrophils Relative %: 87 %
Platelets: 193 10*3/uL (ref 150–400)
RBC: 3.24 MIL/uL — ABNORMAL LOW (ref 4.22–5.81)
RDW: 13 % (ref 11.5–15.5)
WBC: 9.9 10*3/uL (ref 4.0–10.5)
nRBC: 0 % (ref 0.0–0.2)

## 2020-01-26 LAB — BASIC METABOLIC PANEL
Anion gap: 10 (ref 5–15)
BUN: 25 mg/dL — ABNORMAL HIGH (ref 8–23)
CO2: 26 mmol/L (ref 22–32)
Calcium: 8.6 mg/dL — ABNORMAL LOW (ref 8.9–10.3)
Chloride: 96 mmol/L — ABNORMAL LOW (ref 98–111)
Creatinine, Ser: 1.19 mg/dL (ref 0.61–1.24)
GFR, Estimated: 59 mL/min — ABNORMAL LOW (ref >60)
Glucose, Bld: 97 mg/dL (ref 70–99)
Potassium: 4.4 mmol/L (ref 3.5–5.1)
Sodium: 132 mmol/L — ABNORMAL LOW (ref 135–145)

## 2020-01-26 LAB — TROPONIN I (HIGH SENSITIVITY)
Troponin I (High Sensitivity): 107 ng/L (ref <18)
Troponin I (High Sensitivity): 115 ng/L (ref <18)

## 2020-01-26 LAB — MAGNESIUM: Magnesium: 1.9 mg/dL (ref 1.7–2.4)

## 2020-01-26 MED ORDER — ALUM & MAG HYDROXIDE-SIMETH 200-200-20 MG/5ML PO SUSP: 15.0000 mL | ORAL | Status: DC | PRN

## 2020-01-26 MED ORDER — VANCOMYCIN HCL IN DEXTROSE 1-5 GM/200ML-% IV SOLN: 1000.0000 mg | INTRAVENOUS | Status: DC

## 2020-01-26 MED ORDER — SODIUM CHLORIDE 0.9 % IV BOLUS
500.0000 mL | Freq: Once | INTRAVENOUS | Status: AC
  Administered 2020-01-26: 500 mL via INTRAVENOUS

## 2020-01-26 MED ORDER — CHLORHEXIDINE GLUCONATE CLOTH 2 % EX PADS
6.0000 | MEDICATED_PAD | Freq: Every day | CUTANEOUS | Status: DC
  Administered 2020-01-28 – 2020-02-05 (×7): 6 via TOPICAL

## 2020-01-26 MED ORDER — PANTOPRAZOLE SODIUM 40 MG PO TBEC
40.0000 mg | DELAYED_RELEASE_TABLET | Freq: Every day | ORAL | Status: DC
  Administered 2020-01-26 – 2020-02-05 (×10): 40 mg via ORAL
  Filled 2020-01-26 (×11): qty 1

## 2020-01-26 MED ORDER — ADULT MULTIVITAMIN W/MINERALS CH
1.0000 | ORAL_TABLET | Freq: Every day | ORAL | Status: DC
  Administered 2020-01-31: 1 via ORAL
  Filled 2020-01-26 (×6): qty 1

## 2020-01-26 MED ORDER — ENSURE ENLIVE PO LIQD
237.0000 mL | Freq: Two times a day (BID) | ORAL | Status: DC
  Administered 2020-01-27 – 2020-01-29 (×4): 237 mL via ORAL

## 2020-01-26 NOTE — Plan of Care (Addendum)
2024 BP 83/63, notified Blount, 539ml bolus ordered and completed.  2237 BP 83/64, notified Blount, albumin ordered and completed.   After patient settled down for bedtime, pulse decreased from 110-130s to 80-100s. Patient stated he was anxious and feels better now.   Will continue to monitor.   Problem: Education: Goal: Knowledge of General Education information will improve Description: Including pain rating scale, medication(s)/side effects and non-pharmacologic comfort measures Outcome: Progressing   Problem: Activity: Goal: Risk for activity intolerance will decrease Outcome: Progressing   Problem: Pain Managment: Goal: General experience of comfort will improve Outcome: Progressing   Problem: Safety: Goal: Ability to remain free from injury will improve Outcome: Progressing   Problem: Skin Integrity: Goal: Risk for impaired skin integrity will decrease Outcome: Progressing

## 2020-01-26 NOTE — Progress Notes (Signed)
At 1336 Pt complained of chest pain, describing it as aching, resembling a severe heartburn. VS taken and recorded, wife at bedside updated, MD notified, rapid RN called. New orders received and carried out, Pt monitored.  At 1436 RN rounded on Pt, Pt asleep. At 1528 Pt denies any pain 0/10.

## 2020-01-26 NOTE — Progress Notes (Signed)
Pharmacy Antibiotic Note  [name] is a 87 y.o. male admitted on 01/23/2020 with LLE cellulitis that didn't improve on doxycycline.  Patient also has a history of R AKA with exposed bone at amputation site.  Pharmacy consulted to dose vancomycin.  Patient is immunocompromised.  Renal function improving, afebrile, WBC normalized.  Plan: Change vanc to 1gm IV Q48H for AUC 469 Monitor renal fxn, clinical progress, vanc peak/through as indicated F/u plan for right AKA revision   Weight: 50 kg (110 lb 3.7 oz)  Temp (24hrs), Avg:98.1 F (36.7 C), Min:97.8 F (36.6 C), Max:98.5 F (36.9 C)  Recent Labs  Lab 01/19/20 2014 01/20/20 0125 01/23/20 1957 01/24/20 1546 01/25/20 0147 01/26/20 0121  WBC 14.5*  --  12.0* 11.0* 9.7 9.9  CREATININE 1.35*  --  1.46* 1.33* 1.25* 1.19  LATICACIDVEN 1.7 1.6 1.6  --   --   --     Estimated Creatinine Clearance: 30.3 mL/min (by C-G formula based on SCr of 1.19 mg/dL).    Allergies  Allergen Reactions  . Cefdinir Diarrhea and Other (See Comments)    Severe Diarrhea  . Nitrofurantoin Swelling and Other (See Comments)    Hand Swelling  . Sulfa Antibiotics Anaphylaxis and Swelling  . Sulfonamide Derivatives Swelling and Other (See Comments)    Facial/tongue swelling  . Tape Other (See Comments)    SKIN IS VERY THIN AND TEARS EASILY!!!!! Please do NOT use "plastic" tape- USE PAPER!!  . Ciprofloxacin Itching, Rash and Other (See Comments)    Red itchy hands  . Nitrofurantoin Swelling and Other (See Comments)    Swollen hands  . Amoxicillin Er Other (See Comments)    Frequest urination. Pt reports "not really" allergic 01/24/20  . Doxycycline Other (See Comments)    "Felt terrible" Pt wife reports "not really" allergic 01/24/20  . Levaquin [Levofloxacin] Itching and Rash  . Sertraline Anxiety and Other (See Comments)    Makes the patient jittery    Vanc 1/15 >> Doxy PTA 1/15 >> 1/15  1/15 BCx - NGTD  [name] D. Mina Marble, PharmD, BCPS,  [place] 01/26/2020, 8:42 AM

## 2020-01-26 NOTE — Progress Notes (Signed)
   Patient now amenable to surgery.  We will plan for revision of right AKA tomorrow in the OR.  Coburn Knaus C. Donzetta Matters, MD [facility] Office: [phone] Pager: [phone]

## 2020-01-26 NOTE — Progress Notes (Signed)
Patient ID: [name], male   DOB: Jun 10, 1929, 87 y.o.   MRN: [id number]   PROGRESS NOTE    [name]  HYW:[id number] DOB: 1929/11/01 DOA: 01/23/2020 PCP: [name], MD   Brief Narrative:  87 year old male with history of hypertension, hyperlipidemia, COPD, chronic hypoxic respiratory failure requiring 2 L/min nasal cannula as well as trilogy, chronic diastolic heart failure, status post right AKA presented with worsening left foot pain and redness not improving with outpatient doxycycline. On presentation, he had left foot redness going towards the ankle and was started on IV antibiotics.  Assessment & Plan:   Left foot cellulitis with failed outpatient antibiotics -Patient failed outpatient doxycycline. MRI of the foot does not show any signs of osteomyelitis. Continue vancomycin. Cellulitis slightly improving.  Right AKA stump wound Peripheral vascular disease -Vascular surgery following. MRI did not show any osteomyelitis of the right AKA site. -Patient still unsure if he would like any surgery at this time.  -Palliative care evaluation pending. -Continue wound care  Leukocytosis -Resolved  Chronic hypoxic respiratory failure COPD -Uses trilogy and oxygen via nasal cannula 2 L/min -Respiratory status currently stable. Continue nebs as needed  Hypertension -Blood pressure intermittently on the lower side.  Use current antihypertensives with holding parameters  Chronic diastolic heart failure -Strict input and output. Daily weights. Fluid restriction. Continue metoprolol if blood pressure allows. Hold spironolactone and Lasix for now because of acute kidney injury. Outpatient follow-up with cardiology.  Acute kidney injury -Hold spironolactone and Lasix for now. Presented with creatinine of 1.46. Creatinine 1.19 today.  Hyponatremia -Monitor  Generalized deconditioning -PT eval. Palliative care consultation  Stage II coccygeal pressure ulcer: Present on  admission -Continue local wound care  DVT prophylaxis: Lovenox Code Status: CODE STATUS was discussed with the wife at bedside who agrees for DNR. Family Communication: Wife at bedside Disposition Plan: Status is: Inpatient  Remains inpatient appropriate because:Inpatient level of care appropriate due to severity of illness   Dispo: The patient is from: Home              Anticipated d/c is to: Home              Anticipated d/c date is: 2 days              Patient currently is not medically stable to d/c.  Consultants: Vascular surgery.  Palliative care evaluation pending  Procedures: None  Antimicrobials: Vancomycin from 01/24/2020 onwards   Subjective: Patient seen and examined at bedside.  Very poor historian.  Wife present at bedside.  Does not participate in conversation much.  Wife states that the cellulitis is slightly better.  Patient had episodes of tachycardia last night requiring metoprolol IV.  No worsening shortness of breath, chest pain reported. Objective: Vitals:   01/25/20 2237 01/26/20 0400 01/26/20 0630 01/26/20 0759  BP: (!) 83/64 (!) 97/57  (!) 114/58  Pulse: 95 93 67 88  Resp:  16  16  Temp:  98.5 F (36.9 C)  97.8 F (36.6 C)  TempSrc:  Oral  Oral  SpO2:  98%  94%  Weight:        Intake/Output Summary (Last 24 hours) at 01/26/2020 1002 Last data filed at 01/26/2020 0300 Gross per 24 hour  Intake 543.6 ml  Output 1200 ml  Net -656.4 ml   Filed Weights   01/25/20 1718  Weight: 50 kg    Examination:  General exam: Chronically ill.  No distress. Elderly male lying in  bed.  Currently on trilogy Respiratory system: Decreased breath sounds at bases bilaterally with some scattered crackles  cardiovascular system: Currently rate controlled, S1-S2 heard  gastrointestinal system: Abdomen is nondistended, soft and nontender.  Bowel sounds are heard  extremities: No cyanosis, clubbing; left foot erythema improving; no open wounds. Right AKA dressing at  this time. Central nervous system: Extremely poor historian, very slow to respond.  Awake.  No focal neurological deficits.  Moves extremities Skin: No other petechiae/lesions  psychiatry: Affect is flat    Data Reviewed: I have personally reviewed following labs and imaging studies  CBC: Recent Labs  Lab 01/19/20 2014 01/23/20 1957 01/24/20 1546 01/25/20 0147 01/26/20 0121  WBC 14.5* 12.0* 11.0* 9.7 9.9  NEUTROABS 13.4* 11.1*  --   --  8.7*  HGB 12.5* 12.2* 11.8* 11.3* 10.0*  HCT 37.6* 36.0* 37.0* 34.5* 31.0*  MCV 97.2 95.0 96.6 95.0 95.7  PLT 305 266 244 215 563   Basic Metabolic Panel: Recent Labs  Lab 01/19/20 2014 01/23/20 1957 01/24/20 1546 01/25/20 0147 01/26/20 0121  NA 135 132*  --  131* 132*  K 5.0 4.0  --  3.8 4.4  CL 93* 89*  --  92* 96*  CO2 32 32  --  28 26  GLUCOSE 133* 130*  --  97 97  BUN 33* 32*  --  35* 25*  CREATININE 1.35* 1.46* 1.33* 1.25* 1.19  CALCIUM 9.6 9.4  --  8.9 8.6*  MG  --   --   --   --  1.9   GFR: Estimated Creatinine Clearance: 30.3 mL/min (by C-G formula based on SCr of 1.19 mg/dL). Liver Function Tests: Recent Labs  Lab 01/19/20 2014 01/23/20 1957 01/25/20 0147  AST 37 29 23  ALT 60* 46* 33  ALKPHOS 60 61 55  BILITOT 0.8 0.9 1.0  PROT 6.5 6.3* 5.2*  ALBUMIN 4.3 3.9 3.2*   No results for input(s): LIPASE, AMYLASE in the last 168 hours. No results for input(s): AMMONIA in the last 168 hours. Coagulation Profile: No results for input(s): INR, PROTIME in the last 168 hours. Cardiac Enzymes: No results for input(s): CKTOTAL, CKMB, CKMBINDEX, TROPONINI in the last 168 hours. BNP (last 3 results) No results for input(s): PROBNP in the last 8760 hours. HbA1C: No results for input(s): HGBA1C in the last 72 hours. CBG: No results for input(s): GLUCAP in the last 168 hours. Lipid Profile: No results for input(s): CHOL, HDL, LDLCALC, TRIG, CHOLHDL, LDLDIRECT in the last 72 hours. Thyroid Function Tests: No results for  input(s): TSH, T4TOTAL, FREET4, T3FREE, THYROIDAB in the last 72 hours. Anemia Panel: No results for input(s): VITAMINB12, FOLATE, FERRITIN, TIBC, IRON, RETICCTPCT in the last 72 hours. Sepsis Labs: Recent Labs  Lab 01/19/20 2014 01/20/20 0125 01/23/20 1957  LATICACIDVEN 1.7 1.6 1.6    Recent Results (from the past 240 hour(s))  SARS CORONAVIRUS 2 (TAT 6-24 HRS) Nasopharyngeal Nasopharyngeal Swab     Status: None   Collection Time: 01/24/20  6:30 AM   Specimen: Nasopharyngeal Swab  Result Value Ref Range Status   SARS Coronavirus 2 NEGATIVE NEGATIVE Final    Comment: (NOTE) SARS-CoV-2 target nucleic acids are NOT DETECTED.  The SARS-CoV-2 RNA is generally detectable in upper and lower respiratory specimens during the acute phase of infection. Negative results do not preclude SARS-CoV-2 infection, do not rule out co-infections with other pathogens, and should not be used as the sole basis for treatment or other patient management decisions. Negative results  must be combined with clinical observations, patient history, and epidemiological information. The expected result is Negative.  Fact Sheet for Patients: SugarRoll.be  Fact Sheet for Healthcare Providers: https://www.woods-mathews.com/  This test is not yet approved or cleared by the [place] FDA and  has been authorized for detection and/or diagnosis of SARS-CoV-2 by FDA under an Emergency Use Authorization (EUA). This EUA will remain  in effect (meaning this test can be used) for the duration of the COVID-19 declaration under Se ction 564(b)(1) of the Act, 21 U.S.C. section 360bbb-3(b)(1), unless the authorization is terminated or revoked sooner.  Performed at [facility], [place] [address] [place], [place] [address]   Culture, blood (routine x 2)     Status: None (Preliminary result)   Collection Time: 01/24/20  3:46 PM   Specimen: BLOOD  Result Value Ref Range  Status   Specimen Description BLOOD LEFT ANTECUBITAL  Final   Special Requests   Final    BOTTLES DRAWN AEROBIC AND ANAEROBIC Blood Culture adequate volume   Culture   Final    NO GROWTH < 24 HOURS Performed at [facility], [place] [address], [place], [place] [address]    Report Status PENDING  Incomplete  Culture, blood (routine x 2)     Status: None (Preliminary result)   Collection Time: 01/24/20  3:48 PM   Specimen: BLOOD  Result Value Ref Range Status   Specimen Description BLOOD RIGHT ANTECUBITAL  Final   Special Requests   Final    BOTTLES DRAWN AEROBIC AND ANAEROBIC Blood Culture results may not be optimal due to an inadequate volume of blood received in culture bottles   Culture   Final    NO GROWTH < 24 HOURS Performed at [facility], [place] [address] [place],  [address]    Report Status PENDING  Incomplete         Radiology Studies: MR FEMUR RIGHT W WO CONTRAST  Result Date: 01/24/2020 CLINICAL DATA:  Left lower extremity wounds EXAM: MRI OF THE RIGHT FEMUR WITHOUT AND WITH CONTRAST TECHNIQUE: Multiplanar, multisequence MR imaging of the right was performed both before and after administration of intravenous contrast. CONTRAST:  49mL GADAVIST GADOBUTROL 1 MMOL/ML IV SOLN COMPARISON:  None. FINDINGS: Bones/Joint/Cartilage The patient is status post above the knee amputation. At the amputation site no definite areas of abnormal marrow signal or cortical destruction. No large hip joint effusions are seen. Ligaments Suboptimally visualized Muscles and Tendons There is increased feathery signal seen within the tensor fascia lata and the vastus lateralis muscle belly. The tendons appear to be intact. Soft tissues Mild subcutaneous edema seen along the lateral aspect of the hip. There is also mild edema seen along the amputation site. No loculated fluid collection is seen. For IMPRESSION: Mild soft tissue edema seen at the amputation site without evidence of soft  tissue abscess or osteomyelitis. Electronically Signed   By: Prudencio Pair M.D.   On: 01/24/2020 19:34   MR FOOT LEFT W WO CONTRAST  Result Date: 01/24/2020 CLINICAL DATA:  Question of osteomyelitis, left lower extremity wounds EXAM: MRI OF THE LEFT FOREFOOT WITHOUT AND WITH CONTRAST TECHNIQUE: Multiplanar, multisequence MR imaging of the left was performed both before and after administration of intravenous contrast. CONTRAST:  69mL GADAVIST GADOBUTROL 1 MMOL/ML IV SOLN COMPARISON:  None. FINDINGS: Bones/Joint/Cartilage Mildly increased T2 hyperintense signal seen at the distal tuft of the first digit. No C8-T1 hypointensity or areas of abnormal enhancement are noted.  No areas of cortical destruction or periosteal reaction. Mild first MTP joint osteoarthritis is seen. No large joint effusions are noted. Ligaments The Lisfranc and collateral ligaments are intact. Muscles and Tendons Mildly increased T2 hyperintense signal seen within the muscles surrounding the forefoot appear no areas of focal atrophy or tear. The flexor and extensor tendons are intact. Soft tissues Dorsal subcutaneous edema and mild skin thickening are noted a. This diffusely surround the midfoot and hindfoot. No loculated fluid collections are noted. IMPRESSION: Diffuse subcutaneous edema surrounding the midfoot and hindfoot which could be due to cellulitis. No definite evidence of osteomyelitis. Mildly reactive marrow within the distal tuft of the first digit. Electronically Signed   By: Prudencio Pair M.D.   On: 01/24/2020 19:28        Scheduled Meds: . ALPRAZolam  0.5 mg Oral TID  . aspirin EC  81 mg Oral Daily  . atorvastatin  20 mg Oral QHS  . cholecalciferol  2,000 Units Oral Q lunch  . collagenase   Topical Daily  . enoxaparin (LOVENOX) injection  30 mg Subcutaneous Q24H  . finasteride  5 mg Oral Q lunch  . fluticasone  2 spray Each Nare Daily  . fluticasone furoate-vilanterol  1 puff Inhalation Daily  . levothyroxine  50  mcg Oral Daily  . metoprolol succinate  12.5 mg Oral Daily  . mirabegron ER  25 mg Oral Daily  . multivitamin  1 tablet Oral Daily  . pantoprazole  40 mg Oral Daily  . polyethylene glycol  17 g Oral Daily  . potassium chloride  20 mEq Oral Daily  . predniSONE  10 mg Oral Daily  . umeclidinium bromide  1 puff Inhalation Daily  . vitamin B-12  1,000 mcg Oral Daily   Continuous Infusions: . [START ON 01/27/2020] vancomycin            Aline August, MD [facility] 01/26/2020, 10:02 AM

## 2020-01-26 NOTE — Progress Notes (Signed)
Initial Nutrition Assessment  DOCUMENTATION CODES:   Underweight  INTERVENTION:   -Ensure Enlive po BID, each supplement provides 350 kcal and 20 grams of protein -MVI with minerals daily  NUTRITION DIAGNOSIS:   Increased nutrient needs related to wound healing as evidenced by estimated needs.  GOAL:   Patient will meet greater than or equal to 90% of their needs  MONITOR:   PO intake,Supplement acceptance,Labs,Weight trends,Skin,I & O's  REASON FOR ASSESSMENT:   Malnutrition Screening Tool    ASSESSMENT:   [name] is a 87 y.o. male with medical history significant for hypertension, hyperlipidemia, COPD, 2 L/min as well as trilogy, diastolic heart failure, status post right above-knee amputation, sacral decubitus ulcer, who comes in because of ongoing problems with his cellulitis of the left leg he has been seeing his primary care provider who has been managing him with outpatient antibiotics but it got worse and was sent to the emergency department 5 days ago for possible admission for cellulitis he was evaluated and found to have cellulitis but did not require inpatient care and was sent home on oral doxycycline.  He was then seen at the wound clinic left foot has continued to worsen despite antibiotics.  He was seen again  by his primary care provider yesterday who directed him to return to the emergency room for admission  Pt admitted with lt foot cellulitis.  Reviewed I/O's: -656 ml x 24 hours and 854 ml since admission  UOP: 1.2 L x 24 hours  Pt unavailable at time of attempted visit.   Per vascular surgery notes, tentative plan for rt AKA revision (pt wife unsure if she wants to proceed at this time). Cellulitis on lt foot has also resolved.   Pt with good appetite. Noted meal completions 50-100%.   Reviewed wt hx; pt has experienced a 17.8% wt loss over the past 6 months, which is significant for time frame.   Pt with increased nutritional needs due to wound  healing and multiple co-morbidities. Highly suspect malnutrition, however, unable to identify at this time. Pt would greatly benefit from addition of oral nutrition supplements.   Medications reviewed and include vitamin D3, vitamin B-12, and prednisone.  Labs reviewed: Na: 132.   Diet Order:   Diet Order            Diet Heart Room service appropriate? Yes; Fluid consistency: Thin; Fluid restriction: 1500 mL Fluid  Diet effective now                 EDUCATION NEEDS:   No education needs have been identified at this time  Skin:  Skin Assessment: Skin Integrity Issues: Skin Integrity Issues:: Other (Comment) Stage II: coccyx Other: rt stump wound (rt AKA)  Last BM:  01/23/20  Height:   Ht Readings from Last 1 Encounters:  01/19/20 5\' 8"  (1.727 m)    Weight:   Wt Readings from Last 1 Encounters:  01/25/20 50 kg    Ideal Body Weight:  64.4 kg (adjusted for rt AKA)  BMI:  Body mass index is 16.76 kg/m.  Estimated Nutritional Needs:   Kcal:  1800-2000  Protein:  100-115 grams  Fluid:  > 1.8 L    Loistine Chance, RD, LDN, [name] Registered Dietitian II Certified Diabetes Care and Education Specialist Please refer to [facility] for RD and/or RD on-call/weekend/after hours pager

## 2020-01-26 NOTE — Consult Note (Addendum)
Cardiology Consultation:   Patient ID: [name] MRN: [id number]; DOB: 1929/01/23  Admit date: 01/23/2020 Date of Consult: 01/26/2020  Primary Care Provider: [name], MD [name] Cardiologist: Travis Grooms, MD  [name] Electrophysiologist:  None    Patient Profile:   [name] is a 87 y.o. male with a hx of CAD s/p CABG HTN, HLD, COPD with cor pulmonale on 2L [place] and trilogy at home, chronic diastolic heart failure, peripheral vascular disease s/p right AKA who is being seen today for the evaluation of chest pain at the request of [name].  History of Present Illness:   [name] is a 87 year old male with history detailed above who is followed by [name] as an out-patient. He was last seen in 11/07/19 where he was concerned about a left leg wound. He was not having anginal symptoms at that visit. Of note, [name] has been encouraging hospice care for the patient for months given significant comorbidities and overall poor prognosis, but he has declined.  TTE 03/09/2017 showed EF 60 to 65%, grade 1 DD mild TR, peak PA pressure 24 mmHg. TTE  10/08/2018 showed EF>55%, bicuspid aorticvalve.  Patient presented to [facility] ED with LLE cellulitis and right AKA wound with exposed bone for which vascular has been following. Per their note, he has no revascularization options on the LLE. Patient declined left AKA and wishes to proceed with medical management of his left leg symptoms at this time. The right AKA has exposed bone and was recommended for revision by vascular surgery which he is now amenable to undergo.   Today, the patient developed aching left sided chest tightness that radiated across his chest. Has had intermittent symptoms like this in the past for which he has taken SL-NTG.  Trop 107-->119. ECG with sinus rhythm with PACs, RBBB. His pain resolved and he is currently chest pain free.  Had a long discussion with the family and their son at bedside. He is  certainly high risk for any invasive procedure at this time. While he likely has significant coronary artery disease, given his overall poor prognosis long-term, he is not likely to benefit from coronary angiography/PCI. Given that he has no active chest pain currently, trop is relatively flat and ECG is without ischemic changes, will proceed with aggressive medical therapy. Pursuing cath would likely increase his risk of adverse events without significant benefit.     Past Medical History:  Diagnosis Date  . Anxiety   . Arthritis    "generalized" (04/04/2017)  . Asthma   . Basal cell carcinoma    "left ear"  . BPH (benign prostatic hyperplasia)    severe; s/p multiple biopsies  . CAD (coronary artery disease)   . Carotid artery occlusion   . Chronic respiratory failure ([name])   . Chronic rhinitis   . COPD (chronic obstructive pulmonary disease) (HCC)    2L [place] O2  . Diastolic heart failure ([place]) 2019  . Dilation of biliary tract   . Elevated troponin 03/09/2017  . Gallstones   . GERD (gastroesophageal reflux disease)   . History of blood transfusion    "w/his CABG" (04/04/2017)  . History of kidney stones   . Hyperlipidemia   . Hypertension   . On home oxygen therapy    "~ 24/7" (04/04/2017)  . Peripheral vascular disease ([place])   . Pneumonia 2019  . Squamous cell carcinoma of skin 04/23/2013   in situ-Right flank (txpbx)  .  Squamous cell carcinoma of skin 01/23/2017   [place]/ in siut- Left ear rim (CX3FU)  . Syncope and collapse   . Ureteral tumor 08/2015   had endoscopic procedure for evaluation, unable to reach for biopsy  . Urinary catheter (Foley) change required     Past Surgical History:  Procedure Laterality Date  . ABDOMINAL AORTOGRAM W/LOWER EXTREMITY Bilateral 06/30/2019   Procedure: ABDOMINAL AORTOGRAM W/LOWER EXTREMITY;  Surgeon: Waynetta Sandy, MD;  Location: [facility];  Service: Cardiovascular;  Laterality: Bilateral;  . AMPUTATION Right  08/11/2019   Procedure: RIGHT ABOVE KNEE AMPUTATION;  Surgeon: Angelia Mould, MD;  Location: [facility];  Service: Vascular;  Laterality: Right;  . BASAL CELL CARCINOMA EXCISION Left    ear  . CARDIAC CATHETERIZATION     "prior to bypass"  . CATARACT EXTRACTION W/ INTRAOCULAR LENS  IMPLANT, BILATERAL Bilateral   . CORONARY ARTERY BYPASS GRAFT  10/2001   LIMA to LAD, SVG to OM1-2, SVG to RCA and PDA  . LITHOTRIPSY    . PROSTATE BIOPSY  Oct. 2014  . TONSILLECTOMY       Home Medications:  Prior to Admission medications   Medication Sig Start Date End Date Taking? Authorizing Provider  acetaminophen (TYLENOL) 500 MG tablet Take 500 mg by mouth every 6 (six) hours as needed.   Yes [provider]  ALPRAZolam (XANAX) 0.5 MG tablet Take 1 tablet (0.5 mg total) by mouth 3 (three) times daily. Patient taking differently: Take 0.5 mg by mouth 3 (three) times daily as needed. 12/24/19  Yes Tanda Rockers, MD  aspirin 81 MG tablet Take 81 mg by mouth daily.   Yes [provider]  atorvastatin (LIPITOR) 20 MG tablet TAKE 1 TABLET BY MOUTH EVERY DAY Patient taking differently: Take 20 mg by mouth at bedtime. 05/24/16  Yes Belva Crome, MD  calcium carbonate (TUMS - DOSED IN MG ELEMENTAL CALCIUM) 500 MG chewable tablet Chew 1 tablet (200 mg of elemental calcium total) by mouth 2 (two) times daily as needed for indigestion or heartburn. 09/01/19  Yes Love, Ivan Anchors, PA-C  Cholecalciferol (VITAMIN D3) 2000 units TABS Take 2,000 Units by mouth daily with lunch.    Yes [provider]  famotidine (PEPCID) 20 MG tablet Take 20 mg by mouth at bedtime as needed.   Yes [provider]  finasteride (PROSCAR) 5 MG tablet Take 5 mg by mouth daily with lunch.    Yes [provider]  fluticasone (FLONASE) 50 MCG/ACT nasal spray Place 2 sprays into both nostrils daily.   Yes [provider]  furosemide (LASIX) 20 MG tablet Take two tablets by mouth every  morning.  Take one tablet by mouth every evening. 12/08/19  Yes Belva Crome, MD  levalbuterol [facility] HFA) 45 MCG/ACT inhaler Inhale 2 puffs into the lungs every 4 (four) hours as needed for wheezing or shortness of breath. 04/14/19  Yes Tanda Rockers, MD  levalbuterol [name]) 0.63 MG/3ML nebulizer solution Take 3 mLs (0.63 mg total) by nebulization every 4 (four) hours as needed for wheezing or shortness of breath. 10/13/19  Yes Tanda Rockers, MD  levothyroxine (SYNTHROID) 50 MCG tablet Take 50 mcg by mouth daily. 10/27/19  Yes [provider]  metoprolol succinate (TOPROL-XL) 25 MG 24 hr tablet Take 0.5 tablets (12.5 mg total) by mouth daily. 07/11/19  Yes Belva Crome, MD  Multiple Vitamins-Minerals (PRESERVISION AREDS 2) CAPS Take 1 capsule by mouth daily.  Yes [provider]  NITROSTAT 0.4 MG SL tablet Place 0.4 mg under the tongue every 5 (five) minutes as needed for chest pain.   Yes [provider]  OXYGEN Inhale 2 L/min into the lungs continuous.    Yes [provider]  pantoprazole (PROTONIX) 40 MG tablet TAKE 1 TABLET(40 MG) BY MOUTH DAILY 30 TO 60 MINUTES BEFORE FIRST MEAL OF THE DAY Patient taking differently: Take 40 mg by mouth daily. 10/13/19  Yes Tanda Rockers, MD  Polyethyl Glycol-Propyl Glycol (SYSTANE) 0.4-0.3 % SOLN Apply 1 drop to eye 2 (two) times daily as needed (eye irritation). 09/01/19  Yes Love, Ivan Anchors, PA-C  potassium chloride 20 MEQ/15ML (10%) SOLN Take 20 mEq by mouth daily. 10/16/19  Yes [provider]  predniSONE (DELTASONE) 10 MG tablet FOR FLARE OF COUGH/WHEEZING MAY INCREASE TO 2 DAILY BETTER, THAN 1 TABLET BY MOUTH EVERY DAY Patient taking differently: Take 10 mg by mouth daily. If flare occurs, take 20 mg until better. 10/13/19  Yes Tanda Rockers, MD  spironolactone (ALDACTONE) 25 MG tablet Take 25 mg by mouth daily.   Yes [provider]  SYMBICORT 160-4.5 MCG/ACT inhaler Inhale 2 puffs into the  lungs 2 (two) times daily. 06/06/19  Yes Tanda Rockers, MD  vitamin B-12 (CYANOCOBALAMIN) 1000 MCG tablet Take 1,000 mcg by mouth daily.    Yes [provider]  cephALEXin (KEFLEX) 500 MG capsule Take 1 capsule (500 mg total) by mouth 4 (four) times daily. Patient not taking: No sig reported 01/19/20   Jamse Arn, MD  doxycycline (VIBRAMYCIN) 100 MG capsule Take 1 capsule (100 mg total) by mouth 2 (two) times daily. One po bid x 7 days Patient not taking: No sig reported 01/20/20   Deno Etienne, DO  polyethylene glycol (MIRALAX / GLYCOLAX) 17 g packet Take 17 g by mouth daily. Patient not taking: No sig reported 09/01/19   Love, Ivan Anchors, PA-C  sodium chloride (MURO 128) 5 % ophthalmic solution Place 1 drop into both eyes 4 (four) times daily. 08/22/19   Love, Ivan Anchors, PA-C  Tiotropium Bromide Monohydrate (SPIRIVA RESPIMAT) 2.5 MCG/ACT AERS INHALE 2 PUFFS BY MOUTH EVERY DAY Patient taking differently: Inhale 2 puffs into the lungs daily. 05/14/19   Tanda Rockers, MD  Zinc Oxide (TRIPLE PASTE) 12.8 % ointment Apply topically as needed for irritation. To gluteal cleft/buttocks Patient not taking: No sig reported 09/01/19   Bary Leriche, PA-C    Inpatient Medications: Scheduled Meds: . ALPRAZolam  0.5 mg Oral TID  . aspirin EC  81 mg Oral Daily  . atorvastatin  20 mg Oral QHS  . Chlorhexidine Gluconate Cloth  6 each Topical Daily  . cholecalciferol  2,000 Units Oral Q lunch  . collagenase   Topical Daily  . enoxaparin (LOVENOX) injection  30 mg Subcutaneous Q24H  . feeding supplement  237 mL Oral BID BM  . finasteride  5 mg Oral Q lunch  . fluticasone  2 spray Each Nare Daily  . fluticasone furoate-vilanterol  1 puff Inhalation Daily  . levothyroxine  50 mcg Oral Daily  . metoprolol succinate  12.5 mg Oral Daily  . mirabegron ER  25 mg Oral Daily  . multivitamin with minerals  1 tablet Oral Daily  . pantoprazole  40 mg Oral Daily  . polyethylene glycol  17 g Oral Daily  .  potassium chloride  20 mEq Oral Daily  . predniSONE  10 mg Oral Daily  .  umeclidinium bromide  1 puff Inhalation Daily  . vitamin B-12  1,000 mcg Oral Daily   Continuous Infusions: . [START ON 01/27/2020] vancomycin     PRN Meds: alum & mag hydroxide-simeth, calcium carbonate, levalbuterol, metoprolol tartrate, nitroGLYCERIN, polyvinyl alcohol  Allergies:    Allergies  Allergen Reactions  . Cefdinir Diarrhea and Other (See Comments)    Severe Diarrhea  . Nitrofurantoin Swelling and Other (See Comments)    Hand Swelling  . Sulfa Antibiotics Anaphylaxis and Swelling  . Sulfonamide Derivatives Swelling and Other (See Comments)    Facial/tongue swelling  . Tape Other (See Comments)    SKIN IS VERY THIN AND TEARS EASILY!!!!! Please do NOT use "plastic" tape- USE PAPER!!  . Ciprofloxacin Itching, Rash and Other (See Comments)    Red itchy hands  . Nitrofurantoin Swelling and Other (See Comments)    Swollen hands  . Amoxicillin Er Other (See Comments)    Frequest urination. Pt reports "not really" allergic 01/24/20  . Doxycycline Other (See Comments)    "Felt terrible" Pt wife reports "not really" allergic 01/24/20  . Levaquin [Levofloxacin] Itching and Rash  . Sertraline Anxiety and Other (See Comments)    Makes the patient jittery    Social History:   Social History   Socioeconomic History  . Marital status: Married    Spouse name: Tshawn Volbrecht  . Number of children: 2  . Years of education: 69  . Highest education level: 12th grade  Occupational History  . 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 Smoking status: Former Smoker    Packs/day: 2.00    Years: 33.00    Pack years: 66.00    Types: Cigarettes    Quit date: 03/25/1982    Years since quitting: 37.8  . Smokeless tobacco: Never Used  Vaping Use  . Vaping Use: Never used  Substance and Sexual Activity  . Alcohol use: Yes    Alcohol/week: 0.0 standard drinks     Comment: 1.5-3 oz daily in the past, minimal use in the last few months  . Drug use: No  . Sexual activity: Not Currently  Other Topics Concern  . Not on file  Social History Narrative   ** Merged History Encounter **       Pt lives at home with his spouse.         Social Determinants of Health   Financial Resource Strain: Low Risk   . Difficulty of Paying Living Expenses: Not hard at all  Food Insecurity: No Food Insecurity  . Worried About Charity fundraiser in the Last Year: Never true  . Ran Out of Food in the Last Year: Never true  Transportation Needs: No Transportation Needs  . Lack of Transportation (Medical): No  . Lack of Transportation (Non-Medical): No  Physical Activity: Inactive  . Days of Exercise per Week: 0 days  . Minutes of Exercise per Session: 0 min  Stress: No Stress Concern Present  . Feeling of Stress : Only a little  Social Connections: Moderately Integrated  . Frequency of Communication with Friends and Family: More than three times a week  . Frequency of Social Gatherings with Friends and Family: More than three times a week  . Attends Religious Services: More than 4 times per year  . Active Member of Clubs or Organizations: No  . Attends Archivist Meetings: Never  . Marital Status: Married  Human resources officer Violence: Not At Risk  .  Fear of Current or Ex-Partner: No  . Emotionally Abused: No  . Physically Abused: No  . Sexually Abused: No    Family History:    Family History  Problem Relation Age of Onset  . Heart disease Mother   . Heart disease Father        Before age 23  . Breast cancer Mother   . Cancer Mother        Breast cancer  . Hypertension Mother   . Other Mother        AAA  and   Amputation  . Heart attack Father   . Stroke Brother        x3, still living   . Peripheral vascular disease Brother   . Heart disease Brother   . Hyperlipidemia Brother   . Hypertension Brother   . Heart disease Brother   .  Allergies Brother      ROS:  Please see the history of present illness.  Review of Systems  Constitutional: Positive for malaise/fatigue. Negative for chills and fever.  HENT: Positive for hearing loss.   Eyes: Negative for blurred vision.  Respiratory: Positive for shortness of breath.   Cardiovascular: Positive for chest pain and leg swelling. Negative for palpitations and orthopnea.  Gastrointestinal: Negative for nausea and vomiting.  Genitourinary: Negative for hematuria.  Musculoskeletal: Positive for joint pain and myalgias.  Neurological: Negative for loss of consciousness.  Endo/Heme/Allergies: Negative for polydipsia.  Psychiatric/Behavioral: The patient is nervous/anxious.     All other ROS reviewed and negative.     Physical Exam/Data:   Vitals:   01/26/20 1133 01/26/20 1336 01/26/20 1358 01/26/20 1404  BP: (!) 89/54 129/67 130/66 123/66  Pulse: 94 99 94 93  Resp: 19 16 16 15   Temp:  97.6 F (36.4 C)    TempSrc:  Oral    SpO2: 100% 100% 100% 100%  Weight:        Intake/Output Summary (Last 24 hours) at 01/26/2020 1658 Last data filed at 01/26/2020 0300 Gross per 24 hour  Intake 423.6 ml  Output 1200 ml  Net -776.4 ml   Last 3 Weights 01/25/2020 12/16/2019 11/20/2019  Weight (lbs) 110 lb 3.7 oz (No Data) 134 lb  Weight (kg) 50 kg (No Data) 60.782 kg     Body mass index is 16.76 kg/m.  General:  Chronically ill appearing, sitting up in bed HEENT: normal. [place] in place Neck: no JVD Cardiac:  normal S1, S2; RRR; no murmur  Lungs:  clear to auscultation bilaterally, no wheezing, rhonchi or rales  Abd: soft, nontender, no hepatomegaly  Ext: Left LE warm, no significant edema. Right AKA Skin: warm and dry  Neuro:  CNs 2-12 intact, no focal abnormalities noted Psych:  Normal affect   EKG:  The EKG was personally reviewed and demonstrates:  NSR with PACs and RBBB Telemetry:  Telemetry was personally reviewed and demonstrates:  Sinus with small runs of  Afib  Relevant CV Studies: Lower extremity ABI June 2021: Summary:  Right: Resting right ankle-brachial index indicates critical limb  ischemia. The right toe-brachial index is abnormal.   Left: Resting left ankle-brachial index indicates noncompressible left  lower extremity arteries. The left toe-brachial index is abnormal.   TTE 10/08/18: LVEF >55%  TTE 03/09/2017: Study Conclusions   - Left ventricle: The cavity size was normal. Systolic function was  normal. The estimated ejection fraction was in the [name] of 60%  to 65%. Wall motion was normal; there were no regional  wall  motion abnormalities. Doppler parameters are consistent with  abnormal left ventricular relaxation (grade 1 diastolic  dysfunction).  - Aortic valve: Transvalvular velocity was within the normal [name].  There was no stenosis. There was no regurgitation.  - Mitral valve: Transvalvular velocity was within the normal [name].  There was no evidence for stenosis. There was trivial  regurgitation.  - Right ventricle: The cavity size was normal. Wall thickness was  normal. Systolic function was reduced.  - Tricuspid valve: There was mild regurgitation.  - Pulmonary arteries: Systolic pressure was within the normal  [name]. PA peak pressure: 24 mm Hg (S).  - Pericardium, extracardiac: A trivial pericardial effusion was  identified.   NM Myoview 2019: IMPRESSION: 1. Large infarct involving the inferior wall including the inferior apex. Difficult to exclude peri-infarct ischemia at the apex and recommend correlation with ECG testing.  2. Abnormal wall motion along the inferior wall base and septal wall as described.  3. Left ventricular ejection fraction is 48%.  4. Non invasive risk stratification*: Intermediate   Laboratory Data:  High Sensitivity Troponin:   Recent Labs  Lab 01/26/20 1406 01/26/20 1536  TROPONINIHS 107* 115*     Chemistry Recent Labs  Lab  01/23/20 1957 01/24/20 1546 01/25/20 0147 01/26/20 0121  NA 132*  --  131* 132*  K 4.0  --  3.8 4.4  CL 89*  --  92* 96*  CO2 32  --  28 26  GLUCOSE 130*  --  97 97  BUN 32*  --  35* 25*  CREATININE 1.46* 1.33* 1.25* 1.19  CALCIUM 9.4  --  8.9 8.6*  GFRNONAA 46* 51* 55* 59*  ANIONGAP 11  --  11 10    Recent Labs  Lab 01/19/20 2014 01/23/20 1957 01/25/20 0147  PROT 6.5 6.3* 5.2*  ALBUMIN 4.3 3.9 3.2*  AST 37 29 23  ALT 60* 46* 33  ALKPHOS 60 61 55  BILITOT 0.8 0.9 1.0   Hematology Recent Labs  Lab 01/24/20 1546 01/25/20 0147 01/26/20 0121  WBC 11.0* 9.7 9.9  RBC 3.83* 3.63* 3.24*  HGB 11.8* 11.3* 10.0*  HCT 37.0* 34.5* 31.0*  MCV 96.6 95.0 95.7  MCH 30.8 31.1 30.9  MCHC 31.9 32.8 32.3  RDW 13.1 13.1 13.0  PLT 244 215 193   BNPNo results for input(s): BNP, PROBNP in the last 168 hours.  DDimer No results for input(s): DDIMER in the last 168 hours.   Radiology/Studies:  DG Knee 2 Views Right  Result Date: 01/23/2020 CLINICAL DATA:  Prior right above the knee amputation. EXAM: RIGHT KNEE - 1-2 VIEW COMPARISON:  None. FINDINGS: A right-sided above the knee amputation is seen without evidence of acute fracture. Marked severity vascular calcification is noted. IMPRESSION: Right-sided above the knee amputation without evidence of acute osseous abnormality. Electronically Signed   By: Virgina Norfolk M.D.   On: 01/23/2020 20:20   MR FEMUR RIGHT W WO CONTRAST  Result Date: 01/24/2020 CLINICAL DATA:  Left lower extremity wounds EXAM: MRI OF THE RIGHT FEMUR WITHOUT AND WITH CONTRAST TECHNIQUE: Multiplanar, multisequence MR imaging of the right was performed both before and after administration of intravenous contrast. CONTRAST:  9mL GADAVIST GADOBUTROL 1 MMOL/ML IV SOLN COMPARISON:  None. FINDINGS: Bones/Joint/Cartilage The patient is status post above the knee amputation. At the amputation site no definite areas of abnormal marrow signal or cortical destruction. No large  hip joint effusions are seen. Ligaments Suboptimally visualized Muscles and Tendons There is increased feathery  signal seen within the tensor fascia lata and the vastus lateralis muscle belly. The tendons appear to be intact. Soft tissues Mild subcutaneous edema seen along the lateral aspect of the hip. There is also mild edema seen along the amputation site. No loculated fluid collection is seen. For IMPRESSION: Mild soft tissue edema seen at the amputation site without evidence of soft tissue abscess or osteomyelitis. Electronically Signed   By: Prudencio Pair M.D.   On: 01/24/2020 19:34   MR FOOT LEFT W WO CONTRAST  Result Date: 01/24/2020 CLINICAL DATA:  Question of osteomyelitis, left lower extremity wounds EXAM: MRI OF THE LEFT FOREFOOT WITHOUT AND WITH CONTRAST TECHNIQUE: Multiplanar, multisequence MR imaging of the left was performed both before and after administration of intravenous contrast. CONTRAST:  35mL GADAVIST GADOBUTROL 1 MMOL/ML IV SOLN COMPARISON:  None. FINDINGS: Bones/Joint/Cartilage Mildly increased T2 hyperintense signal seen at the distal tuft of the first digit. No C8-T1 hypointensity or areas of abnormal enhancement are noted. No areas of cortical destruction or periosteal reaction. Mild first MTP joint osteoarthritis is seen. No large joint effusions are noted. Ligaments The Lisfranc and collateral ligaments are intact. Muscles and Tendons Mildly increased T2 hyperintense signal seen within the muscles surrounding the forefoot appear no areas of focal atrophy or tear. The flexor and extensor tendons are intact. Soft tissues Dorsal subcutaneous edema and mild skin thickening are noted a. This diffusely surround the midfoot and hindfoot. No loculated fluid collections are noted. IMPRESSION: Diffuse subcutaneous edema surrounding the midfoot and hindfoot which could be due to cellulitis. No definite evidence of osteomyelitis. Mildly reactive marrow within the distal tuft of the first digit.  Electronically Signed   By: Prudencio Pair M.D.   On: 01/24/2020 19:28   DG Foot Complete Left  Result Date: 01/23/2020 CLINICAL DATA:  Left foot swelling. EXAM: LEFT FOOT - COMPLETE 3+ VIEW COMPARISON:  None. FINDINGS: There is no evidence of fracture or dislocation. There is no evidence of arthropathy or other focal bone abnormality. There is marked severity diffuse soft tissue swelling, most prominent along the dorsal aspect of the left foot. Mild vascular calcification is seen. IMPRESSION: Marked severity diffuse soft tissue swelling, without an acute osseous abnormality. Electronically Signed   By: Virgina Norfolk M.D.   On: 01/23/2020 20:21     Assessment and Plan:   #Chest Pain #Known CAD s/p CABG: Patient with known CAD s/p CABG in 2003. Myoview in 2019 with inferior infarct with possible peri-infarct ischemia. Has been recommended for hospice care by [name] over the past several months but has declined. Patient likely does have underlying significant coronary artery disease, however, his overall prognosis is poor and he is unlikely to glean any benefit from cath/revascularization.  He is currently chest pain free with flat troponin and no ischemic changes on ECG. Cath is not indicated at this time and would likely increase his risk of adverse events rather than improve overall outcome. Will continue aggressive medical management for now with no further work-up needed at this time. He is extremely high risk for surgical intervention, but given that this is a salvage procedure and lack of intervention would likely lead to the patient's demise, agree that it is reasonable to proceed given that this is what the patient wishes to do at this time. Discussed with the patient, his wife and their son at length with his overall risk including death during the procedure. Will check TTE to assess LVEF to aide in medical management if  needed.  -Patient extremely high risk for any invasive procedure but  risk would not be further mitigated by invasive coronary angiography or revascularization -Will check TTE to assess LVEF to help guide medical management -No further work-up from cardiac standpoint and agree with proceeding with salvage operation as likely will not survive without surgical intervention and patient and family would currently like to proceed -Will continue aggressive medical management of underlying CAD -Continue ASA 81mg  daily, will increase atorvastatin to 40mg  daily -Continue BB as below  #Chronic diastolic heart failure: Appears euvolemic on exam. NYHA class II-III symptoms. -Check TTE as above -Holding lasix and spiro due to AKI; now improving--resume post-op -Continue metoprolol -Add ACE/ARB as tolerated pending blood pressures/renal function -Monitor I/Os and daily weights  #Peripheral arterial disease: Followed by vascular surgery s/p right AKA with no revascularization options on the left. Declining left AKA but amenable to proceed with revision of right AKA.  -Management per vascular surgery -Continue ASA and statin  #Chronic Hypoxic Respiratory Failure #COPD: On home 2L [place].  -Management per primary team  #HTN: Soft blood pressures during admission -Continue metop -Add ACE/ARB as tolerated   Total time of encounter: 75 minutes total time of encounter, including 60 minutes spent in face-to-face patient care on the date of this encounter. This time includes coordination of care and counseling regarding above mentioned problem list. Remainder of non-face-to-face time involved reviewing chart documents/testing relevant to the patient encounter and documentation in the medical record. I have independently reviewed documentation from referring provider.   Gwyndolyn Kaufman, MD [place]   For questions or updates, please contact [place] Please consult [email or web address] for contact info under    Signed, Freada Bergeron, MD   01/26/2020 4:59 PM

## 2020-01-26 NOTE — Plan of Care (Addendum)
1000 Applied dressing to R leg per order. Offered to change dressing to sacrum but Pt refused at this time.  Problem: Clinical Measurements: Goal: Ability to maintain clinical measurements within normal limits will improve Outcome: Progressing   Problem: Activity: Goal: Risk for activity intolerance will decrease Outcome: Progressing   Problem: Nutrition: Goal: Adequate nutrition will be maintained Outcome: Progressing   Problem: Coping: Goal: Level of anxiety will decrease Outcome: Progressing   Problem: Elimination: Goal: Will not experience complications related to bowel motility Outcome: Progressing   Problem: Pain Managment: Goal: General experience of comfort will improve Outcome: Progressing   Problem: Safety: Goal: Ability to remain free from injury will improve Outcome: Progressing   Problem: Skin Integrity: Goal: Risk for impaired skin integrity will decrease Outcome: Progressing

## 2020-01-26 NOTE — Progress Notes (Signed)
   VASCULAR SURGERY ASSESSMENT & PLAN:   NONHEALING RIGHT AKA: The patient has exposed bone at the end of his right above-the-knee amputation.  He will need a revision with shortening of his amputation site.  I had a long discussion this morning with him and his wife and he still cannot make a decision about whether or not to proceed.  I explained that we have time on the OR schedule today to do this but he is not comfortable proceeding today.  Scheduling later in the week will be more challenging.  However if he is agreeable to proceed later in the week we can schedule him for revision of his right AKA.  CELLULITIS LEFT FOOT: The cellulitis and swelling in his left foot have resolved.  We do not plan an aggressive approach to the left leg given his markedly debilitated state.  SUBJECTIVE:   No specific complaints.  PHYSICAL EXAM:   Vitals:   01/25/20 2237 01/26/20 0400 01/26/20 0630 01/26/20 0759  BP: (!) 83/64 (!) 97/57  (!) 114/58  Pulse: 95 93 67 88  Resp:  16  16  Temp:  98.5 F (36.9 C)  97.8 F (36.6 C)  TempSrc:  Oral  Oral  SpO2:  98%  94%  Weight:         Exposed bone of right AKA as documented in the photograph above.   LABS:   Lab Results  Component Value Date   WBC 9.9 01/26/2020   HGB 10.0 (L) 01/26/2020   HCT 31.0 (L) 01/26/2020   MCV 95.7 01/26/2020   PLT 193 01/26/2020   Lab Results  Component Value Date   CREATININE 1.19 01/26/2020   Lab Results  Component Value Date   INR 1.0 08/11/2019    PROBLEM LIST:    Principal Problem:   Cellulitis of left foot Active Problems:   Hyperlipidemia   Obstructive sleep apnea   Chronic respiratory failure with hypoxia and hypercapnia (HCC)   Peripheral vascular disease (HCC)   Sacral decubitus ulcer   Cellulitis and abscess of left lower extremity   CURRENT MEDS:   . ALPRAZolam  0.5 mg Oral TID  . aspirin EC  81 mg Oral Daily  . atorvastatin  20 mg Oral QHS  . cholecalciferol  2,000 Units Oral Q  lunch  . collagenase   Topical Daily  . enoxaparin (LOVENOX) injection  30 mg Subcutaneous Q24H  . finasteride  5 mg Oral Q lunch  . fluticasone  2 spray Each Nare Daily  . fluticasone furoate-vilanterol  1 puff Inhalation Daily  . levothyroxine  50 mcg Oral Daily  . metoprolol succinate  12.5 mg Oral Daily  . mirabegron ER  25 mg Oral Daily  . multivitamin  1 tablet Oral Daily  . pantoprazole  40 mg Oral Daily  . polyethylene glycol  17 g Oral Daily  . potassium chloride  20 mEq Oral Daily  . predniSONE  10 mg Oral Daily  . umeclidinium bromide  1 puff Inhalation Daily  . vitamin B-12  1,000 mcg Oral Daily    Deitra Mayo Office: [phone] 01/26/2020

## 2020-01-26 NOTE — Significant Event (Signed)
Rapid Response Event Note   Reason for Call :  Patient complaining of Chest pain after palliative and surgical conversations.  Per wife he is very anxious at baseline.  Initial Focused Assessment:  Patient has a history of CABG in 2003.  He states he has chest pain on a regular basis at home, at least monthly. He takes NTG for his chest pain.  Patient complaining of CP 6/10 now 4/10 BP 130/66  SR 94  RR 16 O2 sat 100% on 2L [place] Lung sounds clear, diminished bases.   Interventions:  12 lead EKG  (right bundle branch) NTG sl Labs drawn (troponin)   Plan of Care:  RN to give ntg for additional CP Call if patient continues to have CP    Event Summary:   MD Notified: Aline August Call Time: [place] Time: 4270 End Time: [place]  Raliegh Ip, RN

## 2020-01-26 NOTE — Consult Note (Signed)
Consultation Note Date: 01/26/2020   Patient Name: [name]  DOB: 08/07/1929  MRN: [id number]  Age / Sex: 87 y.o., male  PCP: [name], MD Referring Physician: Aline August, MD  Reason for Consultation: Establishing goals of care and Psychosocial/spiritual support  HPI/Patient Profile: 87 y.o. male admitted on 01/23/2020 with past  medical history significant for hypertension, hyperlipidemia, COPD, 2 L/min as well as trilogy/home, diastolic heart failure, status post right above-knee amputation, sacral decubitus ulcer, who comes in because of ongoing problems with his cellulitis of the left leg he has been seeing his primary care provider who has been managing him with outpatient antibiotics but it got worse and was sent to the emergency department 5 days ago for possible admission for cellulitis he was evaluated and found to have cellulitis but did not require inpatient care and was sent home on oral doxycycline.  He was then seen at the wound clinic left foot has continued to worsen despite antibiotics.  He was seen again  by his primary care provider yesterday who directed him to return to the emergency room for admission   Patient has had continued physical and functional decline over the past 9 months.  He has had 5 admissions and 2 ER visits in the past 6 months.   Today patient is agreeable to surgery in the morning and the plan is for revision of right above-the-knee amputation tomorrow.  Patient and his family face continue decisions secondary to his multiple comorbidities and declining health..  He faces treatment option decisions, advanced directive decisions and anticipatory care needs.     Clinical Assessment and Goals of Care:  This NP Wadie Lessen reviewed medical records, received report from team, assessed the patient and then meet at the patient's bedside along with his wife to  discuss diagnosis, prognosis, GOC, EOL wishes disposition and options.   Concept of Palliative Care was introduced as specialized medical care for people and their families living with serious illness.  If focuses on providing relief from the symptoms and stress of a serious illness.  The goal is to improve quality of life for both the patient and the family.  Values and goals of care important to patient and family were attempted to be elicited.  Created space and opportunity for patient  and family to explore thoughts and feelings regarding current medical information  I met this patient and family in June 2021.  At that time there were detailed goals of care with the patient, his wife and daughter.  At that time we had many long conversations regarding viable care options specific to hospice in the home versus palliative services in the home. Ultimately patient declined hospice services secondary to concerns over philosophy of care.     A  discussion was had today regarding advanced directives.  Concepts specific to code status, artifical feeding and hydration, continued IV antibiotics and rehospitalization was had.    The difference between a aggressive medical intervention path  and a palliative comfort care path  for this patient at this time was had.     MOST form  introduced   Questions and concerns addressed.  Patient  encouraged to call with questions or concerns.     PMT will continue to support holistically.  No documented healthcare power of attorney.  Patient has medical decision-making capacity at this time.  His next of kin/Wife would be his decision maker in the event that the patient could not speak for himself.  Discussed with patient and his wife the importance of continued conversation with each other and family members and the medical providers regarding overall plan of care and treatment options,  ensuring decisions are within the context of the patients values and  GOCs.     SUMMARY OF RECOMMENDATIONS    Code Status/Advance Care Planning:  DNR   Palliative Prophylaxis:   Aspiration, Bowel Regimen, Delirium Protocol, Frequent Pain Assessment and Oral Care  Additional Recommendations (Limitations, Scope, Preferences):  Full Scope Treatment  Psycho-social/Spiritual:   Desire for further Chaplaincy support:no  Additional Recommendations: Education on Hospice  Prognosis:   Unable to determine  Discharge Planning: To Be Determined      Primary Diagnoses: Present on Admission: . Cellulitis of left foot . Obstructive sleep apnea . Chronic respiratory failure with hypoxia and hypercapnia (HCC) . Peripheral vascular disease ([place]) . Hyperlipidemia . Cellulitis and abscess of left lower extremity   I have reviewed the medical record, interviewed the patient and family, and examined the patient. The following aspects are pertinent.  Past Medical History:  Diagnosis Date  . Anxiety   . Arthritis    "generalized" (04/04/2017)  . Asthma   . Basal cell carcinoma    "left ear"  . BPH (benign prostatic hyperplasia)    severe; s/p multiple biopsies  . CAD (coronary artery disease)   . Carotid artery occlusion   . Chronic respiratory failure ([place])   . Chronic rhinitis   . COPD (chronic obstructive pulmonary disease) (HCC)    2L [place] O2  . Diastolic heart failure ([place]) 2019  . Dilation of biliary tract   . Elevated troponin 03/09/2017  . Gallstones   . GERD (gastroesophageal reflux disease)   . History of blood transfusion    "w/his CABG" (04/04/2017)  . History of kidney stones   . Hyperlipidemia   . Hypertension   . On home oxygen therapy    "~ 24/7" (04/04/2017)  . Peripheral vascular disease ([place])   . Pneumonia 2019  . Squamous cell carcinoma of skin 04/23/2013   in situ-Right flank (txpbx)  . Squamous cell carcinoma of skin 01/23/2017   [place]/ in siut- Left ear rim (CX3FU)  . Syncope and collapse   . Ureteral tumor  08/2015   had endoscopic procedure for evaluation, unable to reach for biopsy  . Urinary catheter (Foley) change required    Social History   Socioeconomic History  . Marital status: Married    Spouse name: Clester Chlebowski  . Number of children: 2  . Years of education: 12  . Highest education level: 12th grade  Occupational History  . 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 Smoking status: Former Smoker    Packs/day: 2.00    Years: 33.00    Pack years: 66.00    Types: Cigarettes    Quit date: 03/25/1982    Years since quitting: 37.8  . Smokeless tobacco: Never Used  Vaping Use  . Vaping Use: Never used  Substance  and Sexual Activity  . Alcohol use: Yes    Alcohol/week: 0.0 standard drinks    Comment: 1.5-3 oz daily in the past, minimal use in the last few months  . Drug use: No  . Sexual activity: Not Currently  Other Topics Concern  . Not on file  Social History Narrative   ** Merged History Encounter **       Pt lives at home with his spouse.         Social Determinants of Health   Financial Resource Strain: Low Risk   . Difficulty of Paying Living Expenses: Not hard at all  Food Insecurity: No Food Insecurity  . Worried About Charity fundraiser in the Last Year: Never true  . Ran Out of Food in the Last Year: Never true  Transportation Needs: No Transportation Needs  . Lack of Transportation (Medical): No  . Lack of Transportation (Non-Medical): No  Physical Activity: Inactive  . Days of Exercise per Week: 0 days  . Minutes of Exercise per Session: 0 min  Stress: No Stress Concern Present  . Feeling of Stress : Only a little  Social Connections: Moderately Integrated  . Frequency of Communication with Friends and Family: More than three times a week  . Frequency of Social Gatherings with Friends and Family: More than three times a week  . Attends Religious Services: More than 4 times per year  . Active  Member of Clubs or Organizations: No  . Attends Archivist Meetings: Never  . Marital Status: Married   Family History  Problem Relation Age of Onset  . Heart disease Mother   . Heart disease Father        Before age 41  . Breast cancer Mother   . Cancer Mother        Breast cancer  . Hypertension Mother   . Other Mother        AAA  and   Amputation  . Heart attack Father   . Stroke Brother        x3, still living   . Peripheral vascular disease Brother   . Heart disease Brother   . Hyperlipidemia Brother   . Hypertension Brother   . Heart disease Brother   . Allergies Brother    Scheduled Meds: . ALPRAZolam  0.5 mg Oral TID  . aspirin EC  81 mg Oral Daily  . atorvastatin  20 mg Oral QHS  . Chlorhexidine Gluconate Cloth  6 each Topical Daily  . cholecalciferol  2,000 Units Oral Q lunch  . collagenase   Topical Daily  . enoxaparin (LOVENOX) injection  30 mg Subcutaneous Q24H  . feeding supplement  237 mL Oral BID BM  . finasteride  5 mg Oral Q lunch  . fluticasone  2 spray Each Nare Daily  . fluticasone furoate-vilanterol  1 puff Inhalation Daily  . levothyroxine  50 mcg Oral Daily  . metoprolol succinate  12.5 mg Oral Daily  . mirabegron ER  25 mg Oral Daily  . multivitamin with minerals  1 tablet Oral Daily  . pantoprazole  40 mg Oral Daily  . polyethylene glycol  17 g Oral Daily  . potassium chloride  20 mEq Oral Daily  . predniSONE  10 mg Oral Daily  . umeclidinium bromide  1 puff Inhalation Daily  . vitamin B-12  1,000 mcg Oral Daily   Continuous Infusions: . [START ON 01/27/2020] vancomycin     PRN Meds:.alum & mag hydroxide-simeth,  calcium carbonate, levalbuterol, metoprolol tartrate, nitroGLYCERIN, polyvinyl alcohol Medications Prior to Admission:  Prior to Admission medications   Medication Sig Start Date End Date Taking? Authorizing Provider  acetaminophen (TYLENOL) 500 MG tablet Take 500 mg by mouth every 6 (six) hours as needed.   Yes  [provider]  ALPRAZolam (XANAX) 0.5 MG tablet Take 1 tablet (0.5 mg total) by mouth 3 (three) times daily. Patient taking differently: Take 0.5 mg by mouth 3 (three) times daily as needed. 12/24/19  Yes Tanda Rockers, MD  aspirin 81 MG tablet Take 81 mg by mouth daily.   Yes [provider]  atorvastatin (LIPITOR) 20 MG tablet TAKE 1 TABLET BY MOUTH EVERY DAY Patient taking differently: Take 20 mg by mouth at bedtime. 05/24/16  Yes Belva Crome, MD  calcium carbonate (TUMS - DOSED IN MG ELEMENTAL CALCIUM) 500 MG chewable tablet Chew 1 tablet (200 mg of elemental calcium total) by mouth 2 (two) times daily as needed for indigestion or heartburn. 09/01/19  Yes Love, Ivan Anchors, PA-C  Cholecalciferol (VITAMIN D3) 2000 units TABS Take 2,000 Units by mouth daily with lunch.    Yes [provider]  famotidine (PEPCID) 20 MG tablet Take 20 mg by mouth at bedtime as needed.   Yes [provider]  finasteride (PROSCAR) 5 MG tablet Take 5 mg by mouth daily with lunch.    Yes [provider]  fluticasone (FLONASE) 50 MCG/ACT nasal spray Place 2 sprays into both nostrils daily.   Yes [provider]  furosemide (LASIX) 20 MG tablet Take two tablets by mouth every morning.  Take one tablet by mouth every evening. 12/08/19  Yes Belva Crome, MD  levalbuterol [facility] HFA) 45 MCG/ACT inhaler Inhale 2 puffs into the lungs every 4 (four) hours as needed for wheezing or shortness of breath. 04/14/19  Yes Tanda Rockers, MD  levalbuterol [name]) 0.63 MG/3ML nebulizer solution Take 3 mLs (0.63 mg total) by nebulization every 4 (four) hours as needed for wheezing or shortness of breath. 10/13/19  Yes Tanda Rockers, MD  levothyroxine (SYNTHROID) 50 MCG tablet Take 50 mcg by mouth daily. 10/27/19  Yes [provider]  metoprolol succinate (TOPROL-XL) 25 MG 24 hr tablet Take 0.5 tablets (12.5 mg total) by mouth daily. 07/11/19  Yes Belva Crome, MD   Multiple Vitamins-Minerals (PRESERVISION AREDS 2) CAPS Take 1 capsule by mouth daily.   Yes [provider]  NITROSTAT 0.4 MG SL tablet Place 0.4 mg under the tongue every 5 (five) minutes as needed for chest pain.   Yes [provider]  OXYGEN Inhale 2 L/min into the lungs continuous.    Yes [provider]  pantoprazole (PROTONIX) 40 MG tablet TAKE 1 TABLET(40 MG) BY MOUTH DAILY 30 TO 60 MINUTES BEFORE FIRST MEAL OF THE DAY Patient taking differently: Take 40 mg by mouth daily. 10/13/19  Yes Tanda Rockers, MD  Polyethyl Glycol-Propyl Glycol (SYSTANE) 0.4-0.3 % SOLN Apply 1 drop to eye 2 (two) times daily as needed (eye irritation). 09/01/19  Yes Love, Ivan Anchors, PA-C  potassium chloride 20 MEQ/15ML (10%) SOLN Take 20 mEq by mouth daily. 10/16/19  Yes [provider]  predniSONE (DELTASONE) 10 MG tablet FOR FLARE OF COUGH/WHEEZING MAY INCREASE TO 2 DAILY BETTER, THAN 1 TABLET BY MOUTH EVERY DAY Patient taking differently: Take 10 mg by mouth daily. If flare occurs, take 20 mg until better. 10/13/19  Yes Tanda Rockers, MD  spironolactone (ALDACTONE)  25 MG tablet Take 25 mg by mouth daily.   Yes [provider]  SYMBICORT 160-4.5 MCG/ACT inhaler Inhale 2 puffs into the lungs 2 (two) times daily. 06/06/19  Yes Tanda Rockers, MD  vitamin B-12 (CYANOCOBALAMIN) 1000 MCG tablet Take 1,000 mcg by mouth daily.    Yes [provider]  cephALEXin (KEFLEX) 500 MG capsule Take 1 capsule (500 mg total) by mouth 4 (four) times daily. Patient not taking: No sig reported 01/19/20   Jamse Arn, MD  doxycycline (VIBRAMYCIN) 100 MG capsule Take 1 capsule (100 mg total) by mouth 2 (two) times daily. One po bid x 7 days Patient not taking: No sig reported 01/20/20   Deno Etienne, DO  polyethylene glycol (MIRALAX / GLYCOLAX) 17 g packet Take 17 g by mouth daily. Patient not taking: No sig reported 09/01/19   Love, Ivan Anchors, PA-C  sodium chloride (MURO 128) 5 %  ophthalmic solution Place 1 drop into both eyes 4 (four) times daily. 08/22/19   Love, Ivan Anchors, PA-C  Tiotropium Bromide Monohydrate (SPIRIVA RESPIMAT) 2.5 MCG/ACT AERS INHALE 2 PUFFS BY MOUTH EVERY DAY Patient taking differently: Inhale 2 puffs into the lungs daily. 05/14/19   Tanda Rockers, MD  Zinc Oxide (TRIPLE PASTE) 12.8 % ointment Apply topically as needed for irritation. To gluteal cleft/buttocks Patient not taking: No sig reported 09/01/19   Bary Leriche, PA-C   Allergies  Allergen Reactions  . Cefdinir Diarrhea and Other (See Comments)    Severe Diarrhea  . Nitrofurantoin Swelling and Other (See Comments)    Hand Swelling  . Sulfa Antibiotics Anaphylaxis and Swelling  . Sulfonamide Derivatives Swelling and Other (See Comments)    Facial/tongue swelling  . Tape Other (See Comments)    SKIN IS VERY THIN AND TEARS EASILY!!!!! Please do NOT use "plastic" tape- USE PAPER!!  . Ciprofloxacin Itching, Rash and Other (See Comments)    Red itchy hands  . Nitrofurantoin Swelling and Other (See Comments)    Swollen hands  . Amoxicillin Er Other (See Comments)    Frequest urination. Pt reports "not really" allergic 01/24/20  . Doxycycline Other (See Comments)    "Felt terrible" Pt wife reports "not really" allergic 01/24/20  . Levaquin [Levofloxacin] Itching and Rash  . Sertraline Anxiety and Other (See Comments)    Makes the patient jittery   Review of Systems  Constitutional: Positive for fatigue.  Neurological: Positive for weakness.    Physical Exam Constitutional:      Appearance: He is underweight. He is ill-appearing.  Cardiovascular:     Rate and Rhythm: Normal rate.  Pulmonary:     Breath sounds: Normal breath sounds.  Skin:    General: Skin is warm and dry.  Neurological:     Mental Status: He is alert.  Psychiatric:        Mood and Affect: Mood is anxious.        Cognition and Memory: Cognition normal.     Vital Signs: BP 123/66 (BP Location: Right Arm)    Pulse 93   Temp 97.6 F (36.4 C) (Oral)   Resp 16   Wt 50 kg   SpO2 100%   BMI 16.76 kg/m  Pain Scale: 0-10   Pain Score: 0-No pain   SpO2: SpO2: 100 % O2 Device:SpO2: 100 % O2 Flow Rate: .O2 Flow Rate (L/min): 2 L/min  IO: Intake/output summary:   Intake/Output Summary (Last 24 hours) at 01/26/2020 1610 Last data filed at 01/26/2020  0300 Gross per 24 hour  Intake 423.6 ml  Output 1200 ml  Net -776.4 ml    LBM: Last BM Date: 01/23/20 Baseline Weight: Weight: 50 kg Most recent weight: Weight: 50 kg     Palliative Assessment/Data: 30 % at best   Discussed with Dr Donzetta Matters and bedside RN  Time In: 1200 Time Out: 1310 Time Total: 7 minutes Greater than 50%  of this time was spent counseling and coordinating care related to the above assessment and plan.  Signed by: Wadie Lessen, NP   Please contact Palliative Medicine Team phone at [phone] for questions and concerns.  For individual provider: See Shea Evans

## 2020-01-26 NOTE — Plan of Care (Signed)
Patient is having no s/sx of chest pain at this time. On 2L [place] with O2 sats WNL. Continuous pulse ox in place and trilogy machine at bedside - patient independent with that. Mepilex foam over right AKA site with small amount of bloody drainage noted. Abrasions to bilateral arms, skin tear to left arm covered with mepilex foam. Plan for patient is right AKA revision tomorrow, wife and patient are aware that he needs to be NPO after midnight. Left lower extremity WNL ad elevated on pillows with no s/sx of pain or edema observed. Will continue to monitor and continue current POC.

## 2020-01-27 ENCOUNTER — Inpatient Hospital Stay (HOSPITAL_COMMUNITY): Payer: Medicare Other | Admitting: Certified Registered Nurse Anesthetist

## 2020-01-27 ENCOUNTER — Other Ambulatory Visit (HOSPITAL_COMMUNITY): Payer: Medicare Other

## 2020-01-27 ENCOUNTER — Encounter (HOSPITAL_COMMUNITY)
Admission: EM | Disposition: A | Discharge status: Rehabilitation Facility | Payer: Self-pay | Source: Home / Self Care | Attending: Internal Medicine

## 2020-01-27 ENCOUNTER — Encounter (HOSPITAL_COMMUNITY): Payer: Self-pay | Admitting: Family Medicine

## 2020-01-27 DIAGNOSIS — I739 Peripheral vascular disease, unspecified: Secondary | ICD-10-CM | POA: Diagnosis not present

## 2020-01-27 DIAGNOSIS — R079 Chest pain, unspecified: Secondary | ICD-10-CM

## 2020-01-27 DIAGNOSIS — T8781 Dehiscence of amputation stump: Secondary | ICD-10-CM | POA: Diagnosis not present

## 2020-01-27 DIAGNOSIS — T8789 Other complications of amputation stump: Secondary | ICD-10-CM | POA: Diagnosis not present

## 2020-01-27 DIAGNOSIS — L03116 Cellulitis of left lower limb: Secondary | ICD-10-CM | POA: Diagnosis not present

## 2020-01-27 DIAGNOSIS — J9611 Chronic respiratory failure with hypoxia: Secondary | ICD-10-CM | POA: Diagnosis not present

## 2020-01-27 HISTORY — PX: AMPUTATION: SHX166

## 2020-01-27 LAB — CBC WITH DIFFERENTIAL/PLATELET
Abs Immature Granulocytes: 0.1 10*3/uL — ABNORMAL HIGH (ref 0.00–0.07)
Basophils Absolute: 0 10*3/uL (ref 0.0–0.1)
Basophils Relative: 0 %
Eosinophils Absolute: 0 10*3/uL (ref 0.0–0.5)
Eosinophils Relative: 1 %
HCT: 28.5 % — ABNORMAL LOW (ref 39.0–52.0)
Hemoglobin: 9.6 g/dL — ABNORMAL LOW (ref 13.0–17.0)
Immature Granulocytes: 1 %
Lymphocytes Relative: 6 %
Lymphs Abs: 0.5 10*3/uL — ABNORMAL LOW (ref 0.7–4.0)
MCH: 32.2 pg (ref 26.0–34.0)
MCHC: 33.7 g/dL (ref 30.0–36.0)
MCV: 95.6 fL (ref 80.0–100.0)
Monocytes Absolute: 0.8 10*3/uL (ref 0.1–1.0)
Monocytes Relative: 11 %
Neutro Abs: 5.9 10*3/uL (ref 1.7–7.7)
Neutrophils Relative %: 81 %
Platelets: 176 10*3/uL (ref 150–400)
RBC: 2.98 MIL/uL — ABNORMAL LOW (ref 4.22–5.81)
RDW: 13.2 % (ref 11.5–15.5)
WBC: 7.4 10*3/uL (ref 4.0–10.5)
nRBC: 0 % (ref 0.0–0.2)

## 2020-01-27 LAB — BASIC METABOLIC PANEL
Anion gap: 7 (ref 5–15)
BUN: 20 mg/dL (ref 8–23)
CO2: 28 mmol/L (ref 22–32)
Calcium: 9 mg/dL (ref 8.9–10.3)
Chloride: 99 mmol/L (ref 98–111)
Creatinine, Ser: 1.01 mg/dL (ref 0.61–1.24)
GFR, Estimated: >60 mL/min (ref >60)
Glucose, Bld: 95 mg/dL (ref 70–99)
Potassium: 4.4 mmol/L (ref 3.5–5.1)
Sodium: 134 mmol/L — ABNORMAL LOW (ref 135–145)

## 2020-01-27 LAB — SURGICAL PCR SCREEN
MRSA, PCR: NEGATIVE
Staphylococcus aureus: NEGATIVE

## 2020-01-27 LAB — MAGNESIUM: Magnesium: 2.1 mg/dL (ref 1.7–2.4)

## 2020-01-27 SURGERY — AMPUTATION, ABOVE KNEE
Anesthesia: General | Site: Knee | Laterality: Right

## 2020-01-27 MED ORDER — HYDROCORTISONE NA SUCCINATE PF 100 MG IJ SOLR
INTRAMUSCULAR | Status: DC | PRN
  Administered 2020-01-27: 125 mg via INTRAVENOUS

## 2020-01-27 MED ORDER — FENTANYL CITRATE (PF) 100 MCG/2ML IJ SOLN
INTRAMUSCULAR | Status: AC
  Filled 2020-01-27: qty 2

## 2020-01-27 MED ORDER — ACETAMINOPHEN 325 MG PO TABS
650.0000 mg | ORAL_TABLET | Freq: Four times a day (QID) | ORAL | Status: DC | PRN
  Administered 2020-01-27 – 2020-02-05 (×18): 650 mg via ORAL
  Filled 2020-01-27 (×18): qty 2

## 2020-01-27 MED ORDER — PHENYLEPHRINE 40 MCG/ML (10ML) SYRINGE FOR IV PUSH (FOR BLOOD PRESSURE SUPPORT)
PREFILLED_SYRINGE | INTRAVENOUS | Status: DC | PRN
  Administered 2020-01-27 (×3): 120 ug via INTRAVENOUS

## 2020-01-27 MED ORDER — ONDANSETRON HCL 4 MG/2ML IJ SOLN
INTRAMUSCULAR | Status: DC | PRN
  Administered 2020-01-27: 4 mg via INTRAVENOUS

## 2020-01-27 MED ORDER — ALBUMIN HUMAN 5 % IV SOLN: INTRAVENOUS | Status: DC | PRN

## 2020-01-27 MED ORDER — ONDANSETRON HCL 4 MG/2ML IJ SOLN
INTRAMUSCULAR | Status: AC
  Filled 2020-01-27: qty 6

## 2020-01-27 MED ORDER — LACTATED RINGERS IV SOLN: INTRAVENOUS | Status: DC

## 2020-01-27 MED ORDER — EPHEDRINE SULFATE-NACL 50-0.9 MG/10ML-% IV SOSY
PREFILLED_SYRINGE | INTRAVENOUS | Status: DC | PRN
  Administered 2020-01-27: 15 mg via INTRAVENOUS
  Administered 2020-01-27: 10 mg via INTRAVENOUS

## 2020-01-27 MED ORDER — VANCOMYCIN HCL 750 MG/150ML IV SOLN
750.0000 mg | INTRAVENOUS | Status: DC
  Administered 2020-01-27 – 2020-01-28 (×2): 750 mg via INTRAVENOUS
  Filled 2020-01-27 (×3): qty 150

## 2020-01-27 MED ORDER — MIDAZOLAM HCL 2 MG/2ML IJ SOLN
INTRAMUSCULAR | Status: AC
  Filled 2020-01-27: qty 2

## 2020-01-27 MED ORDER — MORPHINE SULFATE (PF) 2 MG/ML IV SOLN
1.0000 mg | INTRAVENOUS | Status: DC | PRN
  Administered 2020-01-27 (×2): 1 mg via INTRAVENOUS
  Filled 2020-01-27 (×3): qty 1

## 2020-01-27 MED ORDER — FENTANYL CITRATE (PF) 100 MCG/2ML IJ SOLN
INTRAMUSCULAR | Status: DC | PRN
  Administered 2020-01-27: 25 ug via INTRAVENOUS

## 2020-01-27 MED ORDER — MUPIROCIN 2 % EX OINT
1.0000 "application " | TOPICAL_OINTMENT | Freq: Two times a day (BID) | CUTANEOUS | Status: AC
  Administered 2020-01-28 – 2020-01-29 (×4): 1 via NASAL
  Filled 2020-01-27: qty 22

## 2020-01-27 MED ORDER — HYDROCORTISONE NA SUCCINATE PF 250 MG IJ SOLR
INTRAMUSCULAR | Status: AC
  Filled 2020-01-27: qty 250

## 2020-01-27 MED ORDER — 0.9 % SODIUM CHLORIDE (POUR BTL) OPTIME
TOPICAL | Status: DC | PRN
  Administered 2020-01-27: 1000 mL

## 2020-01-27 MED ORDER — EPHEDRINE 5 MG/ML INJ
INTRAVENOUS | Status: AC
  Filled 2020-01-27: qty 10

## 2020-01-27 MED ORDER — PHENYLEPHRINE 40 MCG/ML (10ML) SYRINGE FOR IV PUSH (FOR BLOOD PRESSURE SUPPORT)
PREFILLED_SYRINGE | INTRAVENOUS | Status: AC
  Filled 2020-01-27: qty 10

## 2020-01-27 MED ORDER — PROPOFOL 10 MG/ML IV BOLUS
INTRAVENOUS | Status: DC | PRN
  Administered 2020-01-27: 120 mg via INTRAVENOUS

## 2020-01-27 MED ORDER — CHLORHEXIDINE GLUCONATE 0.12 % MT SOLN
15.0000 mL | Freq: Once | OROMUCOSAL | Status: AC
  Administered 2020-01-27: 15 mL via OROMUCOSAL
  Filled 2020-01-27: qty 15

## 2020-01-27 MED ORDER — ATORVASTATIN CALCIUM 40 MG PO TABS
40.0000 mg | ORAL_TABLET | Freq: Every day | ORAL | Status: DC
  Administered 2020-01-27 – 2020-02-04 (×9): 40 mg via ORAL
  Filled 2020-01-27 (×9): qty 1

## 2020-01-27 MED ORDER — FENTANYL CITRATE (PF) 250 MCG/5ML IJ SOLN
INTRAMUSCULAR | Status: AC
  Filled 2020-01-27: qty 5

## 2020-01-27 MED ORDER — FENTANYL CITRATE (PF) 100 MCG/2ML IJ SOLN
25.0000 ug | INTRAMUSCULAR | Status: DC | PRN
  Administered 2020-01-27: 50 ug via INTRAVENOUS

## 2020-01-27 MED ORDER — TRAMADOL HCL 50 MG PO TABS
50.0000 mg | ORAL_TABLET | Freq: Four times a day (QID) | ORAL | Status: DC | PRN
  Administered 2020-01-27: 50 mg via ORAL
  Filled 2020-01-27: qty 1

## 2020-01-27 MED ORDER — CEFAZOLIN SODIUM-DEXTROSE 2-3 GM-%(50ML) IV SOLR
INTRAVENOUS | Status: DC | PRN
  Administered 2020-01-27: 2 g via INTRAVENOUS

## 2020-01-27 MED ORDER — LIDOCAINE 2% (20 MG/ML) 5 ML SYRINGE
INTRAMUSCULAR | Status: DC | PRN
  Administered 2020-01-27: 60 mg via INTRAVENOUS

## 2020-01-27 SURGICAL SUPPLY — 54 items
BANDAGE ESMARK 6X9 LF (GAUZE/BANDAGES/DRESSINGS) IMPLANT
BLADE SAGITTAL (BLADE)
BLADE SAGITTAL 25.0X1.19X90 (BLADE) ×2 IMPLANT
BLADE SAW GIGLI 510 (BLADE) ×2 IMPLANT
BLADE SAW THK.89X75X18XSGTL (BLADE) IMPLANT
BNDG CMPR 9X6 STRL LF SNTH (GAUZE/BANDAGES/DRESSINGS)
BNDG COHESIVE 6X5 TAN STRL LF (GAUZE/BANDAGES/DRESSINGS) ×2 IMPLANT
BNDG ELASTIC 4X5.8 VLCR STR LF (GAUZE/BANDAGES/DRESSINGS) ×2 IMPLANT
BNDG ELASTIC 6X5.8 VLCR STR LF (GAUZE/BANDAGES/DRESSINGS) IMPLANT
BNDG ESMARK 6X9 LF (GAUZE/BANDAGES/DRESSINGS)
BNDG GAUZE ELAST 4 BULKY (GAUZE/BANDAGES/DRESSINGS) ×2 IMPLANT
CANISTER SUCT 3000ML PPV (MISCELLANEOUS) ×2 IMPLANT
CLIP VESOCCLUDE MED 6/CT (CLIP) IMPLANT
COVER SURGICAL LIGHT HANDLE (MISCELLANEOUS) ×2 IMPLANT
COVER WAND RF STERILE (DRAPES) IMPLANT
CUFF TOURN SGL QUICK 24 (TOURNIQUET CUFF)
CUFF TOURN SGL QUICK 34 (TOURNIQUET CUFF)
CUFF TRNQT CYL 24X4X16.5-23 (TOURNIQUET CUFF) IMPLANT
CUFF TRNQT CYL 34X4.125X (TOURNIQUET CUFF) IMPLANT
DRAIN CHANNEL 19F RND (DRAIN) IMPLANT
DRAPE HALF SHEET 40X57 (DRAPES) ×2 IMPLANT
DRAPE ORTHO SPLIT 77X108 STRL (DRAPES) ×4
DRAPE SURG ORHT 6 SPLT 77X108 (DRAPES) ×2 IMPLANT
DRSG ADAPTIC 3X8 NADH LF (GAUZE/BANDAGES/DRESSINGS) ×2 IMPLANT
ELECT CAUTERY BLADE 6.4 (BLADE) ×2 IMPLANT
ELECT REM PT RETURN 9FT ADLT (ELECTROSURGICAL) ×2
ELECTRODE REM PT RTRN 9FT ADLT (ELECTROSURGICAL) ×1 IMPLANT
EVACUATOR SILICONE 100CC (DRAIN) IMPLANT
GAUZE SPONGE 4X4 12PLY STRL (GAUZE/BANDAGES/DRESSINGS) ×2 IMPLANT
GLOVE BIO SURGEON STRL SZ7.5 (GLOVE) ×2 IMPLANT
GLOVE SURG SS PI 6.5 STRL IVOR (GLOVE) ×2 IMPLANT
GLOVE SURG UNDER POLY LF SZ6.5 (GLOVE) ×2 IMPLANT
GOWN STRL REUS W/ TWL LRG LVL3 (GOWN DISPOSABLE) ×2 IMPLANT
GOWN STRL REUS W/ TWL XL LVL3 (GOWN DISPOSABLE) ×1 IMPLANT
GOWN STRL REUS W/TWL LRG LVL3 (GOWN DISPOSABLE) ×4
GOWN STRL REUS W/TWL XL LVL3 (GOWN DISPOSABLE) ×2
KIT BASIN OR (CUSTOM PROCEDURE TRAY) ×2 IMPLANT
KIT TURNOVER KIT B (KITS) ×2 IMPLANT
NS IRRIG 1000ML POUR BTL (IV SOLUTION) ×2 IMPLANT
PACK GENERAL/GYN (CUSTOM PROCEDURE TRAY) ×2 IMPLANT
PAD ARMBOARD 7.5X6 YLW CONV (MISCELLANEOUS) ×4 IMPLANT
SPONGE LAP 18X18 RF (DISPOSABLE) ×2 IMPLANT
STAPLER VISISTAT 35W (STAPLE) IMPLANT
STOCKINETTE IMPERVIOUS LG (DRAPES) IMPLANT
SUT ETHILON 3 0 PS 1 (SUTURE) IMPLANT
SUT SILK 0 TIES 10X30 (SUTURE) IMPLANT
SUT SILK 2 0 (SUTURE)
SUT SILK 2-0 18XBRD TIE 12 (SUTURE) IMPLANT
SUT SILK 3 0 (SUTURE)
SUT SILK 3-0 18XBRD TIE 12 (SUTURE) IMPLANT
SUT VIC AB 2-0 CT1 18 (SUTURE) ×4 IMPLANT
TOWEL GREEN STERILE (TOWEL DISPOSABLE) ×4 IMPLANT
UNDERPAD 30X36 HEAVY ABSORB (UNDERPADS AND DIAPERS) ×2 IMPLANT
WATER STERILE IRR 1000ML POUR (IV SOLUTION) ×2 IMPLANT

## 2020-01-27 NOTE — Anesthesia Preprocedure Evaluation (Addendum)
Anesthesia Evaluation  Patient identified by MRN, date of birth, ID band Patient awake    Reviewed: Allergy & Precautions, NPO status , Patient's Chart, lab work & pertinent test results  Airway Mallampati: II  TM Distance: >3 FB     Dental   Pulmonary asthma , sleep apnea , pneumonia, COPD, former smoker,    breath sounds clear to auscultation       Cardiovascular hypertension, + angina + CAD, + Past MI, + Peripheral Vascular Disease, +CHF and + DOE  + dysrhythmias  Rhythm:Regular Rate:Normal     Neuro/Psych  Headaches,    GI/Hepatic Neg liver ROS, GERD  ,  Endo/Other  Hypothyroidism   Renal/GU Renal disease     Musculoskeletal  (+) Arthritis ,   Abdominal   Peds  Hematology  (+) anemia ,   Anesthesia Other Findings   Reproductive/Obstetrics                             Anesthesia Physical Anesthesia Plan  ASA: III  Anesthesia Plan: General   Post-op Pain Management:    Induction: Intravenous  PONV Risk Score and Plan: 2 and Ondansetron and Dexamethasone  Airway Management Planned: LMA  Additional Equipment:   Intra-op Plan:   Post-operative Plan: Extubation in OR  Informed Consent: I have reviewed the patients History and Physical, chart, labs and discussed the procedure including the risks, benefits and alternatives for the proposed anesthesia with the patient or authorized representative who has indicated his/her understanding and acceptance.     Dental advisory given  Plan Discussed with: CRNA and Anesthesiologist  Anesthesia Plan Comments:        Anesthesia Quick Evaluation

## 2020-01-27 NOTE — Progress Notes (Signed)
PT Cancellation Note  Patient Details Name: [name] MRN: [id number] DOB: Sep 15, 1929   Cancelled Treatment:    Reason Eval/Treat Not Completed: Patient at procedure or test/unavailable   For AKA revision today;   Roney Marion, PT  Acute Rehabilitation Services Pager [phone] Office [phone]    [place] 01/27/2020, 8:42 AM

## 2020-01-27 NOTE — Anesthesia Procedure Notes (Signed)
Procedure Name: LMA Insertion Date/Time: 01/27/2020 9:42 AM Performed by: Hoy Morn, CRNA Pre-anesthesia Checklist: Patient identified, Emergency Drugs available, Suction available and Patient being monitored Patient Re-evaluated:Patient Re-evaluated prior to induction Oxygen Delivery Method: Circle system utilized Preoxygenation: Pre-oxygenation with 100% oxygen Induction Type: IV induction Ventilation: Mask ventilation without difficulty LMA: LMA inserted LMA Size: 4.0 Number of attempts: 1 Tube secured with: Tape

## 2020-01-27 NOTE — Progress Notes (Signed)
  Progress Note    01/27/2020 9:04 AM Day of Surgery  Subjective:  Chest pain resolved  Vitals:   01/27/20 0440 01/27/20 0732  BP: 120/74 139/74  Pulse: 86 78  Resp:  18  Temp: (!) 97.4 F (36.3 C) (!) 97.3 F (36.3 C)  SpO2: 98% 99%    Physical Exam: Awake and alert, nervous Right aka with dressing cdi  CBC    Component Value Date/Time   WBC 7.4 01/27/2020 0313   RBC 2.98 (L) 01/27/2020 0313   HGB 9.6 (L) 01/27/2020 0313   HGB 10.6 (L) 09/22/2019 1556   HCT 28.5 (L) 01/27/2020 0313   HCT 31.7 (L) 09/22/2019 1556   PLT 176 01/27/2020 0313   PLT 258 09/22/2019 1556   MCV 95.6 01/27/2020 0313   MCV 93 09/22/2019 1556   MCH 32.2 01/27/2020 0313   MCHC 33.7 01/27/2020 0313   RDW 13.2 01/27/2020 0313   RDW 12.0 09/22/2019 1556   LYMPHSABS 0.5 (L) 01/27/2020 0313   LYMPHSABS 0.4 (L) 06/25/2019 1449   MONOABS 0.8 01/27/2020 0313   EOSABS 0.0 01/27/2020 0313   EOSABS 0.0 06/25/2019 1449   BASOSABS 0.0 01/27/2020 0313   BASOSABS 0.1 06/25/2019 1449    BMET    Component Value Date/Time   NA 134 (L) 01/27/2020 0313   NA 132 (L) 11/07/2019 1636   K 4.4 01/27/2020 0313   CL 99 01/27/2020 0313   CO2 28 01/27/2020 0313   GLUCOSE 95 01/27/2020 0313   BUN 20 01/27/2020 0313   BUN 23 11/07/2019 1636   CREATININE 1.01 01/27/2020 0313   CALCIUM 9.0 01/27/2020 0313   GFRNONAA >60 01/27/2020 0313   GFRAA 72 11/07/2019 1636    INR    Component Value Date/Time   INR 1.0 08/11/2019 0715     Intake/Output Summary (Last 24 hours) at 01/27/2020 0904 Last data filed at 01/27/2020 0500 Gross per 24 hour  Intake 240 ml  Output 2900 ml  Net -2660 ml     Assessment:  87 y.o. male is here with R AKA wound  Plan: R aka revision in OR today.  Loy Mccartt C. Donzetta Matters, MD [facility] Office: [phone] Pager: [phone]  01/27/2020 9:04 AM

## 2020-01-27 NOTE — Transfer of Care (Signed)
Immediate Anesthesia Transfer of Care Note  Patient: [name]  Procedure(s) Performed: RIGHT AMPUTATION ABOVE KNEE REVISION (Right Knee)  Patient Location: PACU  Anesthesia Type:General  Level of Consciousness: awake and drowsy  Airway & Oxygen Therapy: Patient Spontanous Breathing and Patient connected to face mask oxygen  Post-op Assessment: Report given to RN and Post -op Vital signs reviewed and stable  Post vital signs: Reviewed and stable  Last Vitals:  Vitals Value Taken Time  BP    Temp    Pulse    Resp    SpO2      Last Pain:  Vitals:   01/27/20 0732  TempSrc: Oral  PainSc:          Complications: No complications documented.

## 2020-01-27 NOTE — OR Nursing (Signed)
Pt is awake,alert and oriented.Pt and/or family verbalized understanding of poc and discharge instructions. Reviewed admission and on going care with receiving RN. Pt is in NAD at this time and is ready to be transferred to floor. Will con't to monitor until pt is transferred. Belongings on bed with patient Pt on 02 2l/min [place] Pt on Monitor

## 2020-01-27 NOTE — Anesthesia Postprocedure Evaluation (Signed)
Anesthesia Post Note  Patient: [name]  Procedure(s) Performed: RIGHT AMPUTATION ABOVE KNEE REVISION (Right Knee)     Patient location during evaluation: PACU Anesthesia Type: General Level of consciousness: awake Pain management: pain level controlled Vital Signs Assessment: post-procedure vital signs reviewed and stable Respiratory status: spontaneous breathing Cardiovascular status: stable Postop Assessment: no apparent nausea or vomiting Anesthetic complications: no   No complications documented.  Last Vitals:  Vitals:   01/27/20 1114 01/27/20 1115  BP:  (!) 151/82  Pulse: 90 89  Resp: 12 11  Temp:    SpO2: 100% 100%    Last Pain:  Vitals:   01/27/20 1114  TempSrc:   PainSc: 7                  Tanith Dagostino

## 2020-01-27 NOTE — Anesthesia Postprocedure Evaluation (Signed)
Anesthesia Post Note  Patient: [name]  Procedure(s) Performed: RIGHT AMPUTATION ABOVE KNEE REVISION (Right Knee)     Patient location during evaluation: PACU Anesthesia Type: General Level of consciousness: awake Pain management: pain level controlled Vital Signs Assessment: post-procedure vital signs reviewed and stable Respiratory status: spontaneous breathing Cardiovascular status: stable Postop Assessment: no apparent nausea or vomiting Anesthetic complications: no   No complications documented.  Last Vitals:  Vitals:   01/27/20 1130 01/27/20 1147  BP:  140/76  Pulse: 92 87  Resp: 12 18  Temp:  36.4 C  SpO2: 100% 100%    Last Pain:  Vitals:   01/27/20 1147  TempSrc: Oral  PainSc:                  Corazon Nickolas

## 2020-01-27 NOTE — Plan of Care (Signed)
Patient is s/p right AKA revision today. ACE wrap and gauze to right AKA site. Bloody drainage present tonight so reinforced dsg with more ACE wrap and placed a pad on top of the pillow to monitor drainage. Patient is not complaining of pain at this time but knows that he has morphine ordered PRN if he wants it. Wife at bedside. NAD or needs voiced. Trilogy machine on - patient is independent with that. Will continue to monitor and continue current POC.

## 2020-01-27 NOTE — Op Note (Signed)
    Patient name: [name] MRN: [id number] DOB: 1929-07-31 Sex: male  01/27/2020 Pre-operative Diagnosis: right above knee amputation wound with exposed bone Post-operative diagnosis:  Same Surgeon:  Eda Paschal. Donzetta Matters, MD Procedure Performed:  Revision of right above knee amputation   Indications: 87 year old male with a history of a right above-knee amputation.  He now has exposed bone he is indicated for revision.  Findings: There was very good pulsatile blood flow in his wound.  SFA is occluded we did suture ligate the vein and the artery.  The bone was taken approximately 6 inches higher in the wound was taken 2 inches higher.  At completion the tissue was very healthy reapproximated easily.   Procedure:  The patient was identified in the holding area and taken to the operating room was placed supine on upper table general anesthesia induced.  He was sterilely prepped and draped in the right lower extremity usual fashion antibiotics were ministered a timeout was called.  I performed a fishmouth type incision above his previous healed areas as well as the wound.  We dissected down to the bone.  All the muscle appeared healthy and viable.  We went all the way around the bone and then elevated the periosteum to a total of 6 inches.  I then transected this with a power saw.  I smoothed the bone with rasp.  I oversewed the artery and vein with a figure-of-eight Vicryl stitch.  We obtain hemostasis we irrigated the wound.  We reapproximated the anterior posterior flaps with Vicryl suture.  Skin was closed with staples.  Sterile dressing was placed.  All counts were correct at completion.  He was then awakened from anesthesia having tolerated procedure without any complication.   EBL: 50cc   Sovereign Ramiro C. Donzetta Matters, MD [facility] [place] Office: [phone] Pager: [phone]

## 2020-01-27 NOTE — Progress Notes (Addendum)
Patient ID: [name], male   DOB: Feb 25, 1929, 87 y.o.   MRN: [id number]   PROGRESS NOTE    [name]  KYH:[id number] DOB: Nov 07, 1929 DOA: 01/23/2020 PCP: [name], MD   Brief Narrative:  87 year old male with history of hypertension, hyperlipidemia, COPD, chronic hypoxic respiratory failure requiring 2 L/min nasal cannula as well as trilogy, chronic diastolic heart failure, status post right AKA presented with worsening left foot pain and redness not improving with outpatient doxycycline. On presentation, he had left foot redness going towards the ankle and was started on IV antibiotics.  Assessment & Plan:   Left foot cellulitis with failed outpatient antibiotics -Patient failed outpatient doxycycline. MRI of the foot does not show any signs of osteomyelitis. Continue vancomycin. Cellulitis slightly improving.  Right AKA stump wound Peripheral vascular disease -Vascular surgery following. MRI did not show any osteomyelitis of the right AKA site. -Vascular surgery planning for revision surgery today.  Follow further recommendations  Leukocytosis -Resolved  Chronic hypoxic respiratory failure COPD -Uses trilogy and oxygen via nasal cannula 2 L/min -Respiratory status currently stable. Continue nebs as needed  Hypertension -Blood pressure improving.  Use current antihypertensives with holding parameters  Chronic diastolic heart failure -Strict input and output. Daily weights. Fluid restriction. Continue metoprolol. Held spironolactone and Lasix because of acute kidney injury and hypotension.  will have to possibly resume after surgery. -Cardiology following.  Chest pain with mildly positive troponins in a patient with CAD status post CABG -Patient had chest pain on 01/26/2020 with only minimally elevated troponin which did not significantly trend upwards.  Cardiology was consulted who recommended medical management.  Currently chest pain-free.  Echo pending.  Acute  kidney injury -spironolactone and Lasix on hold for now. Presented with creatinine of 1.46. Creatinine 1.01 today.  We will possibly resume after surgery  Hyponatremia -Monitor  Generalized deconditioning -PT eval. Palliative care consultation -Patient/family held yesterday agreed for DNR status but subsequently changed the mind and patient is currently full code.  Overall prognosis is very poor.  Stage II coccygeal pressure ulcer: Present on admission -Continue local wound care  DVT prophylaxis: Lovenox Code Status: Full Family Communication: Wife at bedside on 01/27/2020.  Spoke to son on phone on 01/26/2020 Disposition Plan: Status is: Inpatient  Remains inpatient appropriate because:Inpatient level of care appropriate due to severity of illness   Dispo: The patient is from: Home              Anticipated d/c is to: Home              Anticipated d/c date is: 2 days              Patient currently is not medically stable to d/c.  Consultants: Vascular surgery.  Palliative care evaluation pending  Procedures: None  Antimicrobials: Vancomycin from 01/24/2020 onwards   Subjective: Patient seen and examined at bedside in perioperative area.  Very poor historian. Does not participate in conversation much.  Nursing staff reported that patient had chest pain yesterday afternoon.  Currently denies chest pain.  Denies worsening shortness of breath, nausea, vomiting or fever.   Objective: Vitals:   01/26/20 1404 01/26/20 2035 01/27/20 0440 01/27/20 0500  BP: 123/66 137/82 120/74   Pulse: 93 95 86   Resp: 15     Temp:  98.7 F (37.1 C) (!) 97.4 F (36.3 C)   TempSrc:  Oral Oral   SpO2: 100% 100% 98%   Weight:    53.9 kg  Intake/Output Summary (Last 24 hours) at 01/27/2020 0725 Last data filed at 01/27/2020 0500 Gross per 24 hour  Intake 360 ml  Output 2900 ml  Net -2540 ml   Filed Weights   01/25/20 1718 01/27/20 0500  Weight: 50 kg 53.9 kg    Examination:  General  exam: Looks chronically ill.  No acute distress.  Elderly male lying in bed.  Currently 2 L oxygen via nasal cannula Respiratory system: Bilateral decreased breath sounds bases with some scattered crackles, no wheezing  cardiovascular system: S1-S2 heard, rate controlled gastrointestinal system: Abdomen is nondistended, soft and nontender.  Normal bowel sounds heard  extremities: No clubbing; erythema on the left foot is improving; no open wounds. Right AKA dressing at this time. Central nervous system: Very poor historian, still very slow to respond.  Awake.  No focal neurological deficits.  Moving extremities Skin: No obvious ecchymosis/lesions psychiatry: Flat affect    Data Reviewed: I have personally reviewed following labs and imaging studies  CBC: Recent Labs  Lab 01/23/20 1957 01/24/20 1546 01/25/20 0147 01/26/20 0121 01/27/20 0313  WBC 12.0* 11.0* 9.7 9.9 7.4  NEUTROABS 11.1*  --   --  8.7* 5.9  HGB 12.2* 11.8* 11.3* 10.0* 9.6*  HCT 36.0* 37.0* 34.5* 31.0* 28.5*  MCV 95.0 96.6 95.0 95.7 95.6  PLT 266 244 215 193 0000000   Basic Metabolic Panel: Recent Labs  Lab 01/23/20 1957 01/24/20 1546 01/25/20 0147 01/26/20 0121 01/27/20 0313  NA 132*  --  131* 132* 134*  K 4.0  --  3.8 4.4 4.4  CL 89*  --  92* 96* 99  CO2 32  --  28 26 28   GLUCOSE 130*  --  97 97 95  BUN 32*  --  35* 25* 20  CREATININE 1.46* 1.33* 1.25* 1.19 1.01  CALCIUM 9.4  --  8.9 8.6* 9.0  MG  --   --   --  1.9 2.1   GFR: Estimated Creatinine Clearance: 38.5 mL/min (by C-G formula based on SCr of 1.01 mg/dL). Liver Function Tests: Recent Labs  Lab 01/23/20 1957 01/25/20 0147  AST 29 23  ALT 46* 33  ALKPHOS 61 55  BILITOT 0.9 1.0  PROT 6.3* 5.2*  ALBUMIN 3.9 3.2*   No results for input(s): LIPASE, AMYLASE in the last 168 hours. No results for input(s): AMMONIA in the last 168 hours. Coagulation Profile: No results for input(s): INR, PROTIME in the last 168 hours. Cardiac Enzymes: No  results for input(s): CKTOTAL, CKMB, CKMBINDEX, TROPONINI in the last 168 hours. BNP (last 3 results) No results for input(s): PROBNP in the last 8760 hours. HbA1C: No results for input(s): HGBA1C in the last 72 hours. CBG: No results for input(s): GLUCAP in the last 168 hours. Lipid Profile: No results for input(s): CHOL, HDL, LDLCALC, TRIG, CHOLHDL, LDLDIRECT in the last 72 hours. Thyroid Function Tests: No results for input(s): TSH, T4TOTAL, FREET4, T3FREE, THYROIDAB in the last 72 hours. Anemia Panel: No results for input(s): VITAMINB12, FOLATE, FERRITIN, TIBC, IRON, RETICCTPCT in the last 72 hours. Sepsis Labs: Recent Labs  Lab 01/23/20 1957  LATICACIDVEN 1.6    Recent Results (from the past 240 hour(s))  SARS CORONAVIRUS 2 (TAT 6-24 HRS) Nasopharyngeal Nasopharyngeal Swab     Status: None   Collection Time: 01/24/20  6:30 AM   Specimen: Nasopharyngeal Swab  Result Value Ref Range Status   SARS Coronavirus 2 NEGATIVE NEGATIVE Final    Comment: (NOTE) SARS-CoV-2 target nucleic acids are NOT  DETECTED.  The SARS-CoV-2 RNA is generally detectable in upper and lower respiratory specimens during the acute phase of infection. Negative results do not preclude SARS-CoV-2 infection, do not rule out co-infections with other pathogens, and should not be used as the sole basis for treatment or other patient management decisions. Negative results must be combined with clinical observations, patient history, and epidemiological information. The expected result is Negative.  Fact Sheet for Patients: SugarRoll.be  Fact Sheet for Healthcare Providers: https://www.woods-mathews.com/  This test is not yet approved or cleared by the [place] FDA and  has been authorized for detection and/or diagnosis of SARS-CoV-2 by FDA under an Emergency Use Authorization (EUA). This EUA will remain  in effect (meaning this test can be used) for the  duration of the COVID-19 declaration under Se ction 564(b)(1) of the Act, 21 U.S.C. section 360bbb-3(b)(1), unless the authorization is terminated or revoked sooner.  Performed at [facility], [place] [address], [place], [place] [address]   Culture, blood (routine x 2)     Status: None (Preliminary result)   Collection Time: 01/24/20  3:46 PM   Specimen: BLOOD  Result Value Ref Range Status   Specimen Description BLOOD LEFT ANTECUBITAL  Final   Special Requests   Final    BOTTLES DRAWN AEROBIC AND ANAEROBIC Blood Culture adequate volume   Culture   Final    NO GROWTH 2 DAYS Performed at [facility], [place] [address] [place], [place] [address]    Report Status PENDING  Incomplete  Culture, blood (routine x 2)     Status: None (Preliminary result)   Collection Time: 01/24/20  3:48 PM   Specimen: BLOOD  Result Value Ref Range Status   Specimen Description BLOOD RIGHT ANTECUBITAL  Final   Special Requests   Final    BOTTLES DRAWN AEROBIC AND ANAEROBIC Blood Culture results may not be optimal due to an inadequate volume of blood received in culture bottles   Culture   Final    NO GROWTH 2 DAYS Performed at [facility], [place] [address] [place], [place] [address]    Report Status PENDING  Incomplete         Radiology Studies: No results found.      Scheduled Meds: . ALPRAZolam  0.5 mg Oral TID  . aspirin EC  81 mg Oral Daily  . atorvastatin  20 mg Oral QHS  . Chlorhexidine Gluconate Cloth  6 each Topical Daily  . cholecalciferol  2,000 Units Oral Q lunch  . collagenase   Topical Daily  . enoxaparin (LOVENOX) injection  30 mg Subcutaneous Q24H  . feeding supplement  237 mL Oral BID BM  . finasteride  5 mg Oral Q lunch  . fluticasone  2 spray Each Nare Daily  . fluticasone furoate-vilanterol  1 puff Inhalation Daily  . levothyroxine  50 mcg Oral Daily  . metoprolol succinate  12.5 mg Oral Daily  . mirabegron ER  25 mg Oral Daily  . multivitamin with  minerals  1 tablet Oral Daily  . mupirocin ointment  1 application Nasal BID  . pantoprazole  40 mg Oral Daily  . polyethylene glycol  17 g Oral Daily  . potassium chloride  20 mEq Oral Daily  . predniSONE  10 mg Oral Daily  . umeclidinium bromide  1 puff Inhalation Daily  . vitamin B-12  1,000 mcg Oral Daily   Continuous Infusions: . vancomycin  Aline August, MD [facility] 01/27/2020, 7:25 AM

## 2020-01-27 NOTE — Progress Notes (Signed)
Pharmacy Antibiotic Note  [name] is a 87 y.o. male admitted on 01/23/2020 with LLE cellulitis that didn't improve on doxycycline.  Patient also has a history of R AKA with exposed bone at amputation site.  Pharmacy consulted to dose vancomycin.  Patient is immunocompromised.  Renal function improving - SCr down to 1.01, afebrile, WBC normalized.  Plan: - Adjust Vanc to 750 mg IV every 24 hours (est AUC 529, Vd 0.72) - Monitor renal fxn, clinical progress, vanc peak/through as indicated   Weight: 53.9 kg (118 lb 13.3 oz)  Temp (24hrs), Avg:97.6 F (36.4 C), Min:97.2 F (36.2 C), Max:98.7 F (37.1 C)  Recent Labs  Lab 01/23/20 1957 01/24/20 1546 01/25/20 0147 01/26/20 0121 01/27/20 0313  WBC 12.0* 11.0* 9.7 9.9 7.4  CREATININE 1.46* 1.33* 1.25* 1.19 1.01  LATICACIDVEN 1.6  --   --   --   --     Estimated Creatinine Clearance: 38.5 mL/min (by C-G formula based on SCr of 1.01 mg/dL).    Allergies  Allergen Reactions  . Cefdinir Diarrhea and Other (See Comments)    Severe Diarrhea  . Nitrofurantoin Swelling and Other (See Comments)    Hand Swelling  . Sulfa Antibiotics Anaphylaxis and Swelling  . Sulfonamide Derivatives Swelling and Other (See Comments)    Facial/tongue swelling  . Tape Other (See Comments)    SKIN IS VERY THIN AND TEARS EASILY!!!!! Please do NOT use "plastic" tape- USE PAPER!!  . Ciprofloxacin Itching, Rash and Other (See Comments)    Red itchy hands  . Nitrofurantoin Swelling and Other (See Comments)    Swollen hands  . Amoxicillin Er Other (See Comments)    Frequest urination. Pt reports "not really" allergic 01/24/20  . Doxycycline Other (See Comments)    "Felt terrible" Pt wife reports "not really" allergic 01/24/20  . Levaquin [Levofloxacin] Itching and Rash  . Sertraline Anxiety and Other (See Comments)    Makes the patient jittery    Vanc 1/15 >> Doxy PTA 1/15 >> 1/15  1/15 BCx - NGTD  Thank you for allowing pharmacy to be a part of  this patient's care.  Alycia Rossetti, PharmD, BCPS Clinical Pharmacist Clinical phone for 01/27/2020: [phone] 01/27/2020 1:03 PM   **Pharmacist phone directory can now be found on [email or web address] ([email or web address]).  Listed under [place].

## 2020-01-28 ENCOUNTER — Encounter (HOSPITAL_COMMUNITY): Payer: Self-pay | Admitting: Vascular Surgery

## 2020-01-28 ENCOUNTER — Inpatient Hospital Stay (HOSPITAL_COMMUNITY): Payer: Medicare Other

## 2020-01-28 DIAGNOSIS — R079 Chest pain, unspecified: Secondary | ICD-10-CM

## 2020-01-28 DIAGNOSIS — L03116 Cellulitis of left lower limb: Secondary | ICD-10-CM | POA: Diagnosis not present

## 2020-01-28 DIAGNOSIS — Z7189 Other specified counseling: Secondary | ICD-10-CM | POA: Diagnosis not present

## 2020-01-28 DIAGNOSIS — R627 Adult failure to thrive: Secondary | ICD-10-CM | POA: Diagnosis not present

## 2020-01-28 DIAGNOSIS — I739 Peripheral vascular disease, unspecified: Secondary | ICD-10-CM | POA: Diagnosis not present

## 2020-01-28 DIAGNOSIS — L02416 Cutaneous abscess of left lower limb: Secondary | ICD-10-CM

## 2020-01-28 LAB — ECHOCARDIOGRAM COMPLETE
Area-P 1/2: 3.03 cm2
Height: 68 in
S' Lateral: 3.1 cm
Weight: 1844.81 oz

## 2020-01-28 LAB — BASIC METABOLIC PANEL
Anion gap: 9 (ref 5–15)
BUN: 16 mg/dL (ref 8–23)
CO2: 24 mmol/L (ref 22–32)
Calcium: 8.8 mg/dL — ABNORMAL LOW (ref 8.9–10.3)
Chloride: 98 mmol/L (ref 98–111)
Creatinine, Ser: 0.99 mg/dL (ref 0.61–1.24)
GFR, Estimated: >60 mL/min (ref >60)
Glucose, Bld: 124 mg/dL — ABNORMAL HIGH (ref 70–99)
Potassium: 4.3 mmol/L (ref 3.5–5.1)
Sodium: 131 mmol/L — ABNORMAL LOW (ref 135–145)

## 2020-01-28 LAB — CBC WITH DIFFERENTIAL/PLATELET
Abs Immature Granulocytes: 0.08 10*3/uL — ABNORMAL HIGH (ref 0.00–0.07)
Basophils Absolute: 0 10*3/uL (ref 0.0–0.1)
Basophils Relative: 0 %
Eosinophils Absolute: 0 10*3/uL (ref 0.0–0.5)
Eosinophils Relative: 0 %
HCT: 23.3 % — ABNORMAL LOW (ref 39.0–52.0)
Hemoglobin: 7.8 g/dL — ABNORMAL LOW (ref 13.0–17.0)
Immature Granulocytes: 1 %
Lymphocytes Relative: 2 %
Lymphs Abs: 0.3 10*3/uL — ABNORMAL LOW (ref 0.7–4.0)
MCH: 32.2 pg (ref 26.0–34.0)
MCHC: 33.5 g/dL (ref 30.0–36.0)
MCV: 96.3 fL (ref 80.0–100.0)
Monocytes Absolute: 1.4 10*3/uL — ABNORMAL HIGH (ref 0.1–1.0)
Monocytes Relative: 12 %
Neutro Abs: 10.4 10*3/uL — ABNORMAL HIGH (ref 1.7–7.7)
Neutrophils Relative %: 85 %
Platelets: 163 10*3/uL (ref 150–400)
RBC: 2.42 MIL/uL — ABNORMAL LOW (ref 4.22–5.81)
RDW: 13.2 % (ref 11.5–15.5)
WBC: 12.2 10*3/uL — ABNORMAL HIGH (ref 4.0–10.5)
nRBC: 0 % (ref 0.0–0.2)

## 2020-01-28 LAB — MAGNESIUM: Magnesium: 1.9 mg/dL (ref 1.7–2.4)

## 2020-01-28 MED ORDER — HYDROCODONE-ACETAMINOPHEN 5-325 MG PO TABS
1.0000 | ORAL_TABLET | ORAL | Status: DC | PRN
  Filled 2020-01-28: qty 1

## 2020-01-28 MED ORDER — FUROSEMIDE 40 MG PO TABS
40.0000 mg | ORAL_TABLET | Freq: Once | ORAL | Status: AC
  Administered 2020-01-29: 40 mg via ORAL
  Filled 2020-01-28: qty 1

## 2020-01-28 MED ORDER — PERFLUTREN LIPID MICROSPHERE
1.0000 mL | INTRAVENOUS | Status: AC | PRN
  Administered 2020-01-28: 2 mL via INTRAVENOUS
  Filled 2020-01-28: qty 10

## 2020-01-28 MED ORDER — SPIRONOLACTONE 25 MG PO TABS
25.0000 mg | ORAL_TABLET | Freq: Every day | ORAL | Status: DC
Start: 2020-01-29 — End: 2020-02-04
  Administered 2020-01-29 – 2020-02-03 (×6): 25 mg via ORAL
  Filled 2020-01-28 (×6): qty 1

## 2020-01-28 MED ORDER — ENOXAPARIN SODIUM 40 MG/0.4ML ~~LOC~~ SOLN
40.0000 mg | SUBCUTANEOUS | Status: DC
  Administered 2020-01-28 – 2020-01-29 (×2): 40 mg via SUBCUTANEOUS
  Filled 2020-01-28 (×2): qty 0.4

## 2020-01-28 NOTE — Progress Notes (Signed)
Patient ID: [name], male   DOB: [birth year], 87 y.o.   MRN: [id number]  This NP visited patient at the bedside as a follow up for palliative medicine needs and emotional support.  Patient is alert and oriented.  He is tired but this is his baseline.  Education offered again regarding hospice benefit in the home vs palliative. Wife tells me they currently have OP community based palliative services.  Readdressed code status. Patient made decision to reverse DNR and is now a Full code. -Encouraged patient/family to consider DNR/DNI status understanding evidenced based poor outcomes in similar hospitalized patient, as the cause of arrest is likely associated with advanced chronic illness rather than an easily reversible acute cardio-pulmonary event.  Patient is high risk for decompensation 2/2 to multiple co-morbid ites.  Patient and his wife are hopeful for SNF for rehabilitation on discharge.  Discussed with patient the importance of continued conversation with family and their  medical providers regarding overall plan of care and treatment options,  ensuring decisions are within the context of the patients values and GOCs.  Questions and concerns addressed   Discussed with Dr Erlinda Hong  Total time spent on the unit was 35 minutes  Greater than 50% of the time was spent in counseling and coordination of care  Wadie Lessen NP  Palliative Medicine Team Team Phone # [phone] Pager [phone]

## 2020-01-28 NOTE — Progress Notes (Signed)
  Echocardiogram 2D Echocardiogram has been performed.  Zerenity Bowron G Jasiya Markie 01/28/2020, 10:31 AM

## 2020-01-28 NOTE — Progress Notes (Signed)
Patient and wife ask that morphine be discontinued from [facility].  They both stated that they "do not like that it made him so lethargic for so long"

## 2020-01-28 NOTE — Progress Notes (Signed)
Echo reviewed. LVEF 60-65%. Normal RV. Normal filling pressures.   No changes to cardiac medications at this time.  We will sing-off. Please call with questions or concerns.   Gwyndolyn Kaufman, MD

## 2020-01-28 NOTE — Progress Notes (Signed)
PT Cancellation Note  Patient Details Name: [name] MRN: [id number] DOB: 06-Jan-1930   Cancelled Treatment:      Pt politely declined PT after we discussed options for getting up, or performing mobility tasks and getting back to bed;  We discussed the benefits of getting OOB;  He tells me he just doesn't feel like himself after having morphine, and wants to try tomorrow;   Roney Marion, [place] Pager [phone] Office [phone]    Colletta [place] 01/28/2020, 12:55 PM

## 2020-01-28 NOTE — Consult Note (Signed)
   [facility] CM Inpatient Consult   01/28/2020  [name] 11-25-1929 [id number]  Patient is currently active with [facility] for chronic disease management services.  Patient has been engaged by a [facility] Coordinator.  Patient/wife was also out reached by a [facility] LCSW for free counseling services until his appointment in March but wife declined at that time.  Our community based plan of care has focused on disease management and community resource support.    Patient has been active with [facility] Palliative home program prior to admission noted as well. Note that a palliative consult is also noted.  Plan:  Will notify Inpatient Transition Of Care [TOC] team member to make aware that [facility] following. Will follow with TOC and Palliative consultant as well for transition of care needs.   Of note, [facility] Care Management services does not replace or interfere with any services that are needed or arranged by inpatient [facility] care management team.  For additional questions or referrals please contact:  Natividad Brood, RN BSN [facility] Liaison  [phone] business mobile phone Toll free office [phone]  Fax number: [phone] [email or web address]

## 2020-01-28 NOTE — Progress Notes (Signed)
PROGRESS NOTE    [name]  [id number] DOB: 07-May-1929 DOA: 01/23/2020 PCP: [name], MD    Chief Complaint  Patient presents with  . Wound Check    Brief Narrative:  87 year old male with history of hypertension, hyperlipidemia, COPD, chronic hypoxic respiratory failure requiring 2 L/min nasal cannula as well as trilogy, chronic diastolic heart failure, status post right AKA presented with worsening left foot pain and redness not improving with outpatient doxycycline. On presentation, he had left foot redness going towards the ankle and was started on IV antibiotics.  Subjective:  Patient seen together with palliative care at bedside  right stump dressing reinforced this morning, report oozing blood from the stump Report left lower extremity edema has much improved, no pain in left lower leg Hard of hearing, wife at bedside He reports he is very sensitive to opiate analgesic, it makes him very drowsy  Assessment & Plan:   Principal Problem:   Cellulitis of left foot Active Problems:   Hyperlipidemia   Obstructive sleep apnea   Chronic respiratory failure with hypoxia and hypercapnia (HCC)   Peripheral vascular disease (HCC)   Sacral decubitus ulcer   Cellulitis and abscess of left lower extremity   Right AKA stump wound/peripheral vascular disease -MRI did not show osteomyelitis of the right AKA site -Status post revision surgery on January 18, wound care antibiotic duration per vascular surgery, will follow vascular surgery recommendation  Left foot cellulitis -Left foot MRI did not show sign of osteomyelitis -Failed outpatient doxycycline, has been on vancomycin since admission -Left foot cellulitis improving, erythema and edema subsiding  Leukocytosis Blood culture no growth, MRSA screening negative, SARS-CoV-2 screening negative Currently no fever Does not appear septic Repeat CBC in the morning   Chest pain with mild positive troponin in a  patient with history of CAD status post CABG -Patient had chest pain on 01/26/2020 with only minimally elevated troponin which did not significantly trend upwards.   -Echocardiogram ordered by cardiology which is unremarkable -Currently on aspirin, Lipitor, Lopressor -Currently chest pain-free, cardiology  recommend medical management, cardiology input appreciated  Chronic diastolic CHF Home medication Lasix and spironolactone held due to hypotension and AKI -AKI has resolved, resume diuretics  Hyponatremia Appear chronic , 161-one 6 1-9 161-9Q on screen may be from heart failure, resume Lasix and spironolactone, monitor BMP  AKI BUN 33/creatinine 1. 4 6 on presentation -AKI resolved, BUN 16, creatinine 0.99 today  Hypertension Presented with hypotension, now blood pressure is improving, resume home blood pressure medication and diuretics, monitor blood pressure  COPD, home O2 dependent, appears on chronic prednisone which is continued here as well No wheezing, no cough  Hypothyroidism Continue Synthroid     DVT prophylaxis: enoxaparin (LOVENOX) injection 40 mg Start: 01/28/20 1400 Place TED hose Start: 01/24/20 1314   Code Status: Full Family Communication: Wife at bedside Disposition:   Status is: Inpatient   Dispo: The patient is from: Home              Anticipated d/c is to: CIR versus skilled nursing facility pending PT eval              Anticipated d/c date is: Pending PT eval                Consultants:   Cardiology  Vascular surgery  Palliative care  Procedures:   Revision of right above-knee amputation on January 18 by Dr. Donzetta Matters  Antimicrobials:    Vancomycin  Objective: Vitals:   01/28/20 0435 01/28/20 0500 01/28/20 0829 01/28/20 0859  BP: (!) 105/54  110/75 110/75  Pulse: 86  95 95  Resp: 19  14 14   Temp: 97.7 F (36.5 C)  97.8 F (36.6 C) 97.8 F (36.6 C)  TempSrc: Oral  Oral Oral  SpO2: 95%  100%   Weight:  52.3 kg  52.3 kg   Height:    5\' 8"  (1.727 m)    Intake/Output Summary (Last 24 hours) at 01/28/2020 1149 Last data filed at 01/28/2020 0500 Gross per 24 hour  Intake 390 ml  Output 650 ml  Net -260 ml   Filed Weights   01/27/20 0500 01/28/20 0500 01/28/20 0859  Weight: 53.9 kg 52.3 kg 52.3 kg    Examination:  General exam: Frail, hard of hearing calm, NAD Respiratory system: Clear to auscultation. Respiratory effort normal. Cardiovascular system: S1 & S2 heard, RRR. No JVD, no murmur, No pedal edema. Gastrointestinal system: Abdomen is nondistended, soft and nontender.  Normal bowel sounds heard. Central nervous system: Alert and oriented. No focal neurological deficits. Extremities: Right AKA, dressing intact, left foot receding erythema and edema, nontender Skin: No rashes, lesions or ulcers Psychiatry: Judgement and insight appear normal. Mood & affect appropriate.     Data Reviewed: I have personally reviewed following labs and imaging studies  CBC: Recent Labs  Lab 01/23/20 1957 01/24/20 1546 01/25/20 0147 01/26/20 0121 01/27/20 0313 01/28/20 0239  WBC 12.0* 11.0* 9.7 9.9 7.4 12.2*  NEUTROABS 11.1*  --   --  8.7* 5.9 10.4*  HGB 12.2* 11.8* 11.3* 10.0* 9.6* 7.8*  HCT 36.0* 37.0* 34.5* 31.0* 28.5* 23.3*  MCV 95.0 96.6 95.0 95.7 95.6 96.3  PLT 266 244 215 193 176 [id number]    Basic Metabolic Panel: Recent Labs  Lab 01/23/20 1957 01/24/20 1546 01/25/20 0147 01/26/20 0121 01/27/20 0313 01/28/20 0239  NA 132*  --  131* 132* 134* 131*  K 4.0  --  3.8 4.4 4.4 4.3  CL 89*  --  92* 96* 99 98  CO2 32  --  28 26 28 24   GLUCOSE 130*  --  97 97 95 124*  BUN 32*  --  35* 25* 20 16  CREATININE 1.46* 1.33* 1.25* 1.19 1.01 0.99  CALCIUM 9.4  --  8.9 8.6* 9.0 8.8*  MG  --   --   --  1.9 2.1 1.9    GFR: Estimated Creatinine Clearance: 38.2 mL/min (by C-G formula based on SCr of 0.99 mg/dL).  Liver Function Tests: Recent Labs  Lab 01/23/20 1957 01/25/20 0147  AST 29 23  ALT 46* 33   ALKPHOS 61 55  BILITOT 0.9 1.0  PROT 6.3* 5.2*  ALBUMIN 3.9 3.2*    CBG: No results for input(s): GLUCAP in the last 168 hours.   Recent Results (from the past 240 hour(s))  SARS CORONAVIRUS 2 (TAT 6-24 HRS) Nasopharyngeal Nasopharyngeal Swab     Status: None   Collection Time: 01/24/20  6:30 AM   Specimen: Nasopharyngeal Swab  Result Value Ref Range Status   SARS Coronavirus 2 NEGATIVE NEGATIVE Final    Comment: (NOTE) SARS-CoV-2 target nucleic acids are NOT DETECTED.  The SARS-CoV-2 RNA is generally detectable in upper and lower respiratory specimens during the acute phase of infection. Negative results do not preclude SARS-CoV-2 infection, do not rule out co-infections with other pathogens, and should not be used as the sole basis for treatment or other patient management decisions. Negative results must  be combined with clinical observations, patient history, and epidemiological information. The expected result is Negative.  Fact Sheet for Patients: SugarRoll.be  Fact Sheet for Healthcare Providers: https://www.woods-mathews.com/  This test is not yet approved or cleared by the [place] FDA and  has been authorized for detection and/or diagnosis of SARS-CoV-2 by FDA under an Emergency Use Authorization (EUA). This EUA will remain  in effect (meaning this test can be used) for the duration of the COVID-19 declaration under Se ction 564(b)(1) of the Act, 21 U.S.C. section 360bbb-3(b)(1), unless the authorization is terminated or revoked sooner.  Performed at [facility], [place] [address], [place], [place] [address]   Culture, blood (routine x 2)     Status: None (Preliminary result)   Collection Time: 01/24/20  3:46 PM   Specimen: BLOOD  Result Value Ref Range Status   Specimen Description BLOOD LEFT ANTECUBITAL  Final   Special Requests   Final    BOTTLES DRAWN AEROBIC AND ANAEROBIC Blood Culture adequate volume    Culture   Final    NO GROWTH 4 DAYS Performed at [facility], [place] [address] [place], [place] [address]    Report Status PENDING  Incomplete  Culture, blood (routine x 2)     Status: None (Preliminary result)   Collection Time: 01/24/20  3:48 PM   Specimen: BLOOD  Result Value Ref Range Status   Specimen Description BLOOD RIGHT ANTECUBITAL  Final   Special Requests   Final    BOTTLES DRAWN AEROBIC AND ANAEROBIC Blood Culture results may not be optimal due to an inadequate volume of blood received in culture bottles   Culture   Final    NO GROWTH 4 DAYS Performed at [facility], [place] [address] [place], [place] [address]    Report Status PENDING  Incomplete  Surgical PCR screen     Status: None   Collection Time: 01/27/20  6:03 AM   Specimen: Nasal Mucosa; Nasal Swab  Result Value Ref Range Status   MRSA, PCR NEGATIVE NEGATIVE Final   Staphylococcus aureus NEGATIVE NEGATIVE Final    Comment: (NOTE) The Xpert SA Assay (FDA approved for NASAL specimens in patients 48 years of age and older), is one component of a comprehensive surveillance program. It is not intended to diagnose infection nor to guide or monitor treatment. Performed at [facility], [place] [address], [place], [place] [address]          Radiology Studies: No results found.      Scheduled Meds: . ALPRAZolam  0.5 mg Oral TID  . aspirin EC  81 mg Oral Daily  . atorvastatin  40 mg Oral QHS  . Chlorhexidine Gluconate Cloth  6 each Topical Daily  . cholecalciferol  2,000 Units Oral Q lunch  . collagenase   Topical Daily  . enoxaparin (LOVENOX) injection  40 mg Subcutaneous Q24H  . feeding supplement  237 mL Oral BID BM  . finasteride  5 mg Oral Q lunch  . fluticasone  2 spray Each Nare Daily  . fluticasone furoate-vilanterol  1 puff Inhalation Daily  . levothyroxine  50 mcg Oral Daily  . metoprolol succinate  12.5 mg Oral Daily  . mirabegron ER  25 mg Oral Daily  . multivitamin  with minerals  1 tablet Oral Daily  . mupirocin ointment  1 application Nasal BID  . pantoprazole  40 mg Oral Daily  . polyethylene glycol  17 g Oral Daily  .  potassium chloride  20 mEq Oral Daily  . predniSONE  10 mg Oral Daily  . umeclidinium bromide  1 puff Inhalation Daily  . vitamin B-12  1,000 mcg Oral Daily   Continuous Infusions: . vancomycin 750 mg (01/27/20 1427)     LOS: 4 days   Time spent: 35 mins Greater than 50% of this time was spent in counseling, explanation of diagnosis, planning of further management, and coordination of care.  I have personally reviewed and interpreted on  01/28/2020 daily labs, tele strips, I reviewed all nursing notes, pharmacy notes, consultant notes,  vitals, pertinent old records  I have discussed plan of care as described above with RN , patient and family on 01/28/2020  Voice Recognition /Dragon dictation system was used to create this note, attempts have been made to correct errors. Please contact the author with questions and/or clarifications.   Florencia Reasons, MD PhD FACP [facility]  Available via Epic secure chat 7am-7pm for nonurgent issues Please page for urgent issues To page the attending provider between 7A-7P or the covering provider during after hours 7P-7A, please log into the web site [email or web address] and access using universal [place] password for that web site. If you do not have the password, please call the hospital operator.    01/28/2020, 11:49 AM

## 2020-01-28 NOTE — Progress Notes (Addendum)
Progress Note    01/28/2020 7:14 AM 1 Day Post-Op  Subjective:  Awake and alert. Wife at bedside . Said dressing was changed due to bleeding   Vitals:   01/27/20 1934 01/28/20 0435  BP: 122/62 (!) 105/54  Pulse: 88 86  Resp: 16 19  Temp: 97.6 F (36.4 C) 97.7 F (36.5 C)  SpO2: 100% 95%    Physical Exam: Cardiac:  RRR Lungs:  CTAB Extremities:  Right AKA dressing dry and intact   CBC    Component Value Date/Time   WBC 12.2 (H) 01/28/2020 0239   RBC 2.42 (L) 01/28/2020 0239   HGB 7.8 (L) 01/28/2020 0239   HGB 10.6 (L) 09/22/2019 1556   HCT 23.3 (L) 01/28/2020 0239   HCT 31.7 (L) 09/22/2019 1556   PLT 163 01/28/2020 0239   PLT 258 09/22/2019 1556   MCV 96.3 01/28/2020 0239   MCV 93 09/22/2019 1556   MCH 32.2 01/28/2020 0239   MCHC 33.5 01/28/2020 0239   RDW 13.2 01/28/2020 0239   RDW 12.0 09/22/2019 1556   LYMPHSABS 0.3 (L) 01/28/2020 0239   LYMPHSABS 0.4 (L) 06/25/2019 1449   MONOABS 1.4 (H) 01/28/2020 0239   EOSABS 0.0 01/28/2020 0239   EOSABS 0.0 06/25/2019 1449   BASOSABS 0.0 01/28/2020 0239   BASOSABS 0.1 06/25/2019 1449    BMET    Component Value Date/Time   NA 131 (L) 01/28/2020 0239   NA 132 (L) 11/07/2019 1636   K 4.3 01/28/2020 0239   CL 98 01/28/2020 0239   CO2 24 01/28/2020 0239   GLUCOSE 124 (H) 01/28/2020 0239   BUN 16 01/28/2020 0239   BUN 23 11/07/2019 1636   CREATININE 0.99 01/28/2020 0239   CALCIUM 8.8 (L) 01/28/2020 0239   GFRNONAA >60 01/28/2020 0239   GFRAA 72 11/07/2019 1636     Intake/Output Summary (Last 24 hours) at 01/28/2020 0714 Last data filed at 01/28/2020 0500 Gross per 24 hour  Intake 1140 ml  Output 1125 ml  Net 15 ml    HOSPITAL MEDICATIONS Scheduled Meds: . ALPRAZolam  0.5 mg Oral TID  . aspirin EC  81 mg Oral Daily  . atorvastatin  40 mg Oral QHS  . Chlorhexidine Gluconate Cloth  6 each Topical Daily  . cholecalciferol  2,000 Units Oral Q lunch  . collagenase   Topical Daily  . enoxaparin  (LOVENOX) injection  30 mg Subcutaneous Q24H  . feeding supplement  237 mL Oral BID BM  . finasteride  5 mg Oral Q lunch  . fluticasone  2 spray Each Nare Daily  . fluticasone furoate-vilanterol  1 puff Inhalation Daily  . levothyroxine  50 mcg Oral Daily  . metoprolol succinate  12.5 mg Oral Daily  . mirabegron ER  25 mg Oral Daily  . multivitamin with minerals  1 tablet Oral Daily  . mupirocin ointment  1 application Nasal BID  . pantoprazole  40 mg Oral Daily  . polyethylene glycol  17 g Oral Daily  . potassium chloride  20 mEq Oral Daily  . predniSONE  10 mg Oral Daily  . umeclidinium bromide  1 puff Inhalation Daily  . vitamin B-12  1,000 mcg Oral Daily   Continuous Infusions: . vancomycin 750 mg (01/27/20 1427)   PRN Meds:.acetaminophen, alum & mag hydroxide-simeth, calcium carbonate, levalbuterol, metoprolol tartrate, morphine injection, nitroGLYCERIN, polyvinyl alcohol, traMADol  Assessment: POD 1 right AKA. VSS. Afebrile Acute BLA; monitor   Plan: -PT/OT -Take dressing down tomorrow. Change as needed. -  spoke with pharmacist. We will put order in place to dc antibiotics tomorrow if surgical site is without signs of infection -DVT prophylaxis:  Lovenox   Risa Grill, PA-C [facility] [phone] 01/28/2020  7:14 AM   I have independently interviewed and examined patient and agree with PA assessment and plan above. I will take his dressing down and evaluate the wound tomorrow. We will need to limit IV pain medicine given ongoing confusion.  Leighton Brickley C. Donzetta Matters, MD [facility] [place] [facility] Office: [phone] Pager: [phone]

## 2020-01-28 NOTE — Plan of Care (Signed)

## 2020-01-29 ENCOUNTER — Telehealth: Payer: Self-pay | Admitting: Physical Medicine & Rehabilitation

## 2020-01-29 DIAGNOSIS — L03116 Cellulitis of left lower limb: Secondary | ICD-10-CM | POA: Diagnosis not present

## 2020-01-29 LAB — CBC WITH DIFFERENTIAL/PLATELET
Abs Immature Granulocytes: 0.1 10*3/uL — ABNORMAL HIGH (ref 0.00–0.07)
Basophils Absolute: 0 10*3/uL (ref 0.0–0.1)
Basophils Relative: 0 %
Eosinophils Absolute: 0 10*3/uL (ref 0.0–0.5)
Eosinophils Relative: 0 %
HCT: 21.9 % — ABNORMAL LOW (ref 39.0–52.0)
Hemoglobin: 7.5 g/dL — ABNORMAL LOW (ref 13.0–17.0)
Immature Granulocytes: 1 %
Lymphocytes Relative: 3 %
Lymphs Abs: 0.4 10*3/uL — ABNORMAL LOW (ref 0.7–4.0)
MCH: 32.9 pg (ref 26.0–34.0)
MCHC: 34.2 g/dL (ref 30.0–36.0)
MCV: 96.1 fL (ref 80.0–100.0)
Monocytes Absolute: 1.5 10*3/uL — ABNORMAL HIGH (ref 0.1–1.0)
Monocytes Relative: 12 %
Neutro Abs: 10.1 10*3/uL — ABNORMAL HIGH (ref 1.7–7.7)
Neutrophils Relative %: 84 %
Platelets: 167 10*3/uL (ref 150–400)
RBC: 2.28 MIL/uL — ABNORMAL LOW (ref 4.22–5.81)
RDW: 13.3 % (ref 11.5–15.5)
WBC: 12.2 10*3/uL — ABNORMAL HIGH (ref 4.0–10.5)
nRBC: 0 % (ref 0.0–0.2)

## 2020-01-29 LAB — BASIC METABOLIC PANEL
Anion gap: 7 (ref 5–15)
BUN: 19 mg/dL (ref 8–23)
CO2: 26 mmol/L (ref 22–32)
Calcium: 9 mg/dL (ref 8.9–10.3)
Chloride: 95 mmol/L — ABNORMAL LOW (ref 98–111)
Creatinine, Ser: 1.07 mg/dL (ref 0.61–1.24)
GFR, Estimated: >60 mL/min (ref >60)
Glucose, Bld: 109 mg/dL — ABNORMAL HIGH (ref 70–99)
Potassium: 4.9 mmol/L (ref 3.5–5.1)
Sodium: 128 mmol/L — ABNORMAL LOW (ref 135–145)

## 2020-01-29 LAB — CULTURE, BLOOD (ROUTINE X 2)
Culture: NO GROWTH
Culture: NO GROWTH
Special Requests: ADEQUATE

## 2020-01-29 MED ORDER — SACCHAROMYCES BOULARDII 250 MG PO CAPS
250.0000 mg | ORAL_CAPSULE | Freq: Two times a day (BID) | ORAL | Status: DC
  Administered 2020-01-29 – 2020-02-03 (×10): 250 mg via ORAL
  Filled 2020-01-29 (×14): qty 1

## 2020-01-29 MED ORDER — AMOXICILLIN-POT CLAVULANATE 875-125 MG PO TABS
1.0000 | ORAL_TABLET | Freq: Two times a day (BID) | ORAL | Status: DC
  Administered 2020-01-29 – 2020-02-05 (×15): 1 via ORAL
  Filled 2020-01-29 (×15): qty 1

## 2020-01-29 MED ORDER — BOOST / RESOURCE BREEZE PO LIQD CUSTOM
1.0000 | Freq: Three times a day (TID) | ORAL | Status: DC
  Administered 2020-01-29 – 2020-02-03 (×9): 1 via ORAL

## 2020-01-29 MED ORDER — VANCOMYCIN HCL 500 MG/100ML IV SOLN
500.0000 mg | INTRAVENOUS | Status: AC
  Administered 2020-01-29 – 2020-01-30 (×2): 500 mg via INTRAVENOUS
  Filled 2020-01-29 (×2): qty 100

## 2020-01-29 NOTE — Plan of Care (Signed)
  Problem: Health Behavior/Discharge Planning: Goal: Ability to manage health-related needs will improve Outcome: Not Progressing   Problem: Activity: Goal: Risk for activity intolerance will decrease Outcome: Not Progressing   

## 2020-01-29 NOTE — Telephone Encounter (Signed)
He had a revision bone was exposed he hopes to be back on rehab floor was given post op Meds that made him loopy and as is not doing well, maybe allergic reaction. When I called to set up next apt the wife let me know they would appreciate any help in getting back on the rehab floor

## 2020-01-29 NOTE — Progress Notes (Signed)
PROGRESS NOTE    [name]  QPY:[id number] DOB: [birth year] DOA: 01/23/2020 PCP: [name], MD    Chief Complaint  Patient presents with  . Wound Check    Brief Narrative:  87 year old male with history of hypertension, hyperlipidemia, COPD, chronic hypoxic respiratory failure requiring 2 L/min nasal cannula as well as trilogy, chronic diastolic heart failure, status post right AKA presented with worsening left foot pain and redness not improving with outpatient doxycycline. On presentation, he had left foot redness going towards the ankle and was started on IV antibiotics.  Subjective:  Report indwelling foley is leaking and having bladder spasm, he wants foley exchanged Reports left foot more swollen , more erythema  Hard of hearing, wife at bedside He reports he is very sensitive to opiate analgesic, it makes him very drowsy, he does not want opiate  Assessment & Plan:   Principal Problem:   Cellulitis of left foot Active Problems:   Hyperlipidemia   Obstructive sleep apnea   Chronic respiratory failure with hypoxia and hypercapnia (HCC)   Peripheral vascular disease (HCC)   Sacral decubitus ulcer   Cellulitis and abscess of left lower extremity   Right AKA stump wound/peripheral vascular disease -MRI did not show osteomyelitis of the right AKA site -Status post revision surgery on January 18, wound care antibiotic duration per vascular surgery, will follow vascular surgery recommendation  Left foot cellulitis -Blood culture no growth, MRSA screening negative, SARS-CoV-2 screening negative -Left foot MRI did not show sign of osteomyelitis -Failed outpatient doxycycline, history of staph lugdunensis right foot sensitive to vanc/doxy/bactrim, he has multiple abx allergies/side effects, has been on Vanco since admission will finish for total 7 days treatment -Left foot cellulitis appear improved yesterday, however today it appear worse, wbc elevated, will add  augmentin to vanc -will check esr/crp - will discuss with vascular surgery    Chest pain with mild positive troponin in a patient with history of CAD status post CABG -Patient had chest pain on 01/26/2020 with only minimally elevated troponin which did not significantly trend upwards.   -Echocardiogram ordered by cardiology which is unremarkable -Currently on aspirin, Lipitor, Lopressor -Currently chest pain-free, cardiology  recommend medical management, cardiology input appreciated  Chronic diastolic CHF Home medication Lasix and spironolactone held due to hypotension and AKI -AKI has resolved, resume diuretics  NSVT: Keep on tele, continue betablocker, keep K >4, mag>2  Hyponatremia Appear chronic , 161-one 6 1-9 161-9Q on screen may be from heart failure, resume Lasix and spironolactone, monitor BMP  AKI BUN 33/creatinine 1. 4 6 on presentation -AKI resolved  Hypertension Presented with hypotension, now blood pressure is improving, resume home blood pressure medication and diuretics, monitor blood pressure  COPD, home O2 dependent, appears on chronic prednisone which is continued here as well No wheezing, no cough  Hypothyroidism Continue Synthroid  OSA on CPAP nightly  Indwelling foley, last changed on 1/7, he follows Dr Junious Silk He wants foley changed today, discussed with RN at bedside, order placed he wants brand name [email or web address], will have family bring in meds and pharmacy verify it     DVT prophylaxis: enoxaparin (LOVENOX) injection 40 mg Start: 01/28/20 1400 Place TED hose Start: 01/24/20 1314   Code Status: Full Family Communication: Wife at bedside Disposition:   Status is: Inpatient   Dispo: The patient is from: Home              Anticipated d/c is to: CIR versus skilled nursing facility pending PT eval  Anticipated d/c date is: Pending PT eval                Consultants:   Cardiology  Vascular surgery  Palliative  care  Procedures:   Revision of right above-knee amputation on January 18 by Dr. Donzetta Matters  Antimicrobials:    Vancomycin    Objective: Vitals:   01/28/20 1937 01/29/20 0253 01/29/20 0500 01/29/20 0821  BP: (!) 105/58 111/63  114/64  Pulse: (!) 101 88  92  Resp: 19 15  16   Temp: 98.3 F (36.8 C) 98 F (36.7 C)  98 F (36.7 C)  TempSrc: Oral Oral  Oral  SpO2: 100% 99%  100%  Weight:   52.2 kg   Height:        Intake/Output Summary (Last 24 hours) at 01/29/2020 0824 Last data filed at 01/28/2020 2100 Gross per 24 hour  Intake -  Output 500 ml  Net -500 ml   Filed Weights   01/28/20 0500 01/28/20 0859 01/29/20 0500  Weight: 52.3 kg 52.3 kg 52.2 kg    Examination:  General exam: Frail, hard of hearing calm, NAD Respiratory system: Clear to auscultation. Respiratory effort normal. Cardiovascular system: S1 & S2 heard, RRR. No JVD, no murmur, No pedal edema. Gastrointestinal system: Abdomen is nondistended, soft and nontender.  Normal bowel sounds heard. Central nervous system: Alert and oriented. No focal neurological deficits. Extremities: Right AKA, dressing intact, left foot has more edema , more erythema, no open wound, see pic below Skin: No rashes, lesions or ulcers Psychiatry: Judgement and insight appear normal. Mood & affect appropriate.       Data Reviewed: I have personally reviewed following labs and imaging studies  CBC: Recent Labs  Lab 01/23/20 1957 01/24/20 1546 01/25/20 0147 01/26/20 0121 01/27/20 0313 01/28/20 0239 01/29/20 0124  WBC 12.0*   < > 9.7 9.9 7.4 12.2* 12.2*  NEUTROABS 11.1*  --   --  8.7* 5.9 10.4* 10.1*  HGB 12.2*   < > 11.3* 10.0* 9.6* 7.8* 7.5*  HCT 36.0*   < > 34.5* 31.0* 28.5* 23.3* 21.9*  MCV 95.0   < > 95.0 95.7 95.6 96.3 96.1  PLT 266   < > 215 193 176 163 167   < > = values in this interval not displayed.    Basic Metabolic Panel: Recent Labs  Lab 01/25/20 0147 01/26/20 0121 01/27/20 0313 01/28/20 0239  01/29/20 0124  NA 131* 132* 134* 131* 128*  K 3.8 4.4 4.4 4.3 4.9  CL 92* 96* 99 98 95*  CO2 28 26 28 24 26   GLUCOSE 97 97 95 124* 109*  BUN 35* 25* 20 16 19   CREATININE 1.25* 1.19 1.01 0.99 1.07  CALCIUM 8.9 8.6* 9.0 8.8* 9.0  MG  --  1.9 2.1 1.9  --     GFR: Estimated Creatinine Clearance: 35.2 mL/min (by C-G formula based on SCr of 1.07 mg/dL).  Liver Function Tests: Recent Labs  Lab 01/23/20 1957 01/25/20 0147  AST 29 23  ALT 46* 33  ALKPHOS 61 55  BILITOT 0.9 1.0  PROT 6.3* 5.2*  ALBUMIN 3.9 3.2*    CBG: No results for input(s): GLUCAP in the last 168 hours.   Recent Results (from the past 240 hour(s))  SARS CORONAVIRUS 2 (TAT 6-24 HRS) Nasopharyngeal Nasopharyngeal Swab     Status: None   Collection Time: 01/24/20  6:30 AM   Specimen: Nasopharyngeal Swab  Result Value Ref Range Status   SARS Coronavirus 2  NEGATIVE NEGATIVE Final    Comment: (NOTE) SARS-CoV-2 target nucleic acids are NOT DETECTED.  The SARS-CoV-2 RNA is generally detectable in upper and lower respiratory specimens during the acute phase of infection. Negative results do not preclude SARS-CoV-2 infection, do not rule out co-infections with other pathogens, and should not be used as the sole basis for treatment or other patient management decisions. Negative results must be combined with clinical observations, patient history, and epidemiological information. The expected result is Negative.  Fact Sheet for Patients: SugarRoll.be  Fact Sheet for Healthcare Providers: https://www.woods-Travis.com/  This test is not yet approved or cleared by the [place] FDA and  has been authorized for detection and/or diagnosis of SARS-CoV-2 by FDA under an Emergency Use Authorization (EUA). This EUA will remain  in effect (meaning this test can be used) for the duration of the COVID-19 declaration under Se ction 564(b)(1) of the Act, 21 U.S.C. section  360bbb-3(b)(1), unless the authorization is terminated or revoked sooner.  Performed at [facility], [place] [address], [place], [place] [address]   Culture, blood (routine x 2)     Status: None   Collection Time: 01/24/20  3:46 PM   Specimen: BLOOD  Result Value Ref Range Status   Specimen Description BLOOD LEFT ANTECUBITAL  Final   Special Requests   Final    BOTTLES DRAWN AEROBIC AND ANAEROBIC Blood Culture adequate volume   Culture   Final    NO GROWTH 5 DAYS Performed at [facility], [place] [address], [place], [place] [address]    Report Status 01/29/2020 FINAL  Final  Culture, blood (routine x 2)     Status: None   Collection Time: 01/24/20  3:48 PM   Specimen: BLOOD  Result Value Ref Range Status   Specimen Description BLOOD RIGHT ANTECUBITAL  Final   Special Requests   Final    BOTTLES DRAWN AEROBIC AND ANAEROBIC Blood Culture results may not be optimal due to an inadequate volume of blood received in culture bottles   Culture   Final    NO GROWTH 5 DAYS Performed at [facility], [place] [address] [place], [place] [address]    Report Status 01/29/2020 FINAL  Final  Surgical PCR screen     Status: None   Collection Time: 01/27/20  6:03 AM   Specimen: Nasal Mucosa; Nasal Swab  Result Value Ref Range Status   MRSA, PCR NEGATIVE NEGATIVE Final   Staphylococcus aureus NEGATIVE NEGATIVE Final    Comment: (NOTE) The Xpert SA Assay (FDA approved for NASAL specimens in patients 43 years of age and older), is one component of a comprehensive surveillance program. It is not intended to diagnose infection nor to guide or monitor treatment. Performed at [facility], [place] [address], [place], [place] [address]          Radiology Studies: ECHOCARDIOGRAM COMPLETE  Result Date: 01/28/2020    ECHOCARDIOGRAM REPORT   Patient Name:   INDIGO CHADDOCK [facility] Date of Exam: 01/28/2020 Medical Rec #:  [id number]    Height:       68.0 in Accession #:    [id number]   Weight:        115.3 lb Date of Birth:  09/18/1929    BSA:          1.617 m Patient Age:    69 years     BP:           110/75 mmHg Patient  Gender: M            HR:           108 bpm. Exam Location:  Inpatient Procedure: 2D Echo, Cardiac Doppler, Color Doppler and Intracardiac            Opacification Agent Indications:    R07.9* Chest pain, unspecified  History:        Patient has prior history of Echocardiogram examinations, most                 recent 03/09/2017. CHF, CAD, COPD and Carotid Disease,                 Signs/Symptoms:Syncope; Risk Factors:Hypertension and                 Dyslipidemia. Cancer. Elevated Troponin.  Sonographer:    Jonelle Sidle Dance Referring Phys: [place]  1. Left ventricular ejection fraction, by estimation, is 60 to 65%. The left ventricle has normal function. The left ventricle has no regional wall motion abnormalities. There is mild concentric left ventricular hypertrophy. Left ventricular diastolic parameters are indeterminate.  2. Right ventricular systolic function is normal. The right ventricular size is normal.  3. The mitral valve is normal in structure. No evidence of mitral valve regurgitation. No evidence of mitral stenosis.  4. The aortic valve is tricuspid. There is mild calcification of the aortic valve. There is mild thickening of the aortic valve. Aortic valve regurgitation is not visualized. Mild aortic valve sclerosis is present, with no evidence of aortic valve stenosis.  5. The inferior vena cava is normal in size with greater than 50% respiratory variability, suggesting right atrial pressure of 3 mmHg. FINDINGS  Left Ventricle: Left ventricular ejection fraction, by estimation, is 60 to 65%. The left ventricle has normal function. The left ventricle has no regional wall motion abnormalities. Definity contrast agent was given IV to delineate the left ventricular  endocardial borders. The left ventricular internal cavity size was normal in size. There is mild  concentric left ventricular hypertrophy. Left ventricular diastolic parameters are indeterminate. Right Ventricle: The right ventricular size is normal. No increase in right ventricular wall thickness. Right ventricular systolic function is normal. Left Atrium: Left atrial size was normal in size. Right Atrium: Right atrial size was normal in size. Pericardium: There is no evidence of pericardial effusion. Mitral Valve: The mitral valve is normal in structure. No evidence of mitral valve regurgitation. No evidence of mitral valve stenosis. Tricuspid Valve: The tricuspid valve is normal in structure. Tricuspid valve regurgitation is not demonstrated. No evidence of tricuspid stenosis. Aortic Valve: The aortic valve is tricuspid. There is mild calcification of the aortic valve. There is mild thickening of the aortic valve. Aortic valve regurgitation is not visualized. Mild aortic valve sclerosis is present, with no evidence of aortic valve stenosis. Pulmonic Valve: The pulmonic valve was normal in structure. Pulmonic valve regurgitation is not visualized. No evidence of pulmonic stenosis. Aorta: The aortic root is normal in size and structure. Venous: The inferior vena cava is normal in size with greater than 50% respiratory variability, suggesting right atrial pressure of 3 mmHg. IAS/Shunts: No atrial level shunt detected by color flow Doppler.  LEFT VENTRICLE PLAX 2D LVIDd:         4.10 cm LVIDs:         3.10 cm LV PW:         1.10 cm LV IVS:  1.10 cm LVOT diam:     1.90 cm LV SV:         41 LV SV Index:   25 LVOT Area:     2.84 cm  IVC IVC diam: 1.80 cm LEFT ATRIUM           Index LA diam:      2.70 cm 1.67 cm/m LA Vol (A4C): 8.7 ml  5.40 ml/m  AORTIC VALVE LVOT Vmax:   73.30 cm/s LVOT Vmean:  46.900 cm/s LVOT VTI:    0.143 m  AORTA Ao Root diam: 3.50 cm Ao Asc diam:  3.20 cm MITRAL VALVE MV Area (PHT): 3.03 cm     SHUNTS MV Decel Time: 250 msec     Systemic VTI:  0.14 m MV E velocity: 104.00 cm/s   Systemic Diam: 1.90 cm MV A velocity: 78.20 cm/s MV E/A ratio:  1.33 Skeet Latch MD Electronically signed by Skeet Latch MD Signature Date/Time: 01/28/2020/12:18:33 PM    Final         Scheduled Meds: . ALPRAZolam  0.5 mg Oral TID  . aspirin EC  81 mg Oral Daily  . atorvastatin  40 mg Oral QHS  . Chlorhexidine Gluconate Cloth  6 each Topical Daily  . cholecalciferol  2,000 Units Oral Q lunch  . collagenase   Topical Daily  . enoxaparin (LOVENOX) injection  40 mg Subcutaneous Q24H  . feeding supplement  237 mL Oral BID BM  . finasteride  5 mg Oral Q lunch  . fluticasone  2 spray Each Nare Daily  . fluticasone furoate-vilanterol  1 puff Inhalation Daily  . levothyroxine  50 mcg Oral Daily  . metoprolol succinate  12.5 mg Oral Daily  . mirabegron ER  25 mg Oral Daily  . multivitamin with minerals  1 tablet Oral Daily  . mupirocin ointment  1 application Nasal BID  . pantoprazole  40 mg Oral Daily  . polyethylene glycol  17 g Oral Daily  . predniSONE  10 mg Oral Daily  . spironolactone  25 mg Oral Daily  . umeclidinium bromide  1 puff Inhalation Daily  . vitamin B-12  1,000 mcg Oral Daily   Continuous Infusions: . vancomycin 750 mg (01/28/20 1309)     LOS: 5 days   Time spent: 35 mins Greater than 50% of this time was spent in counseling, explanation of diagnosis, planning of further management, and coordination of care.  I have personally reviewed and interpreted on  01/29/2020 daily labs, tele strips, I reviewed all nursing notes, pharmacy notes, consultant notes,  vitals, pertinent old records  I have discussed plan of care as described above with RN , patient and family on 01/29/2020  Voice Recognition /Dragon dictation system was used to create this note, attempts have been made to correct errors. Please contact the author with questions and/or clarifications.   Florencia Reasons, MD PhD FACP [facility]  Available via Epic secure chat 7am-7pm for nonurgent  issues Please page for urgent issues To page the attending provider between 7A-7P or the covering provider during after hours 7P-7A, please log into the web site [email or web address] and access using universal [place] password for that web site. If you do not have the password, please call the hospital operator.    01/29/2020, 8:24 AM

## 2020-01-29 NOTE — Progress Notes (Signed)
Pharmacy Antibiotic Note  [name] is a 87 y.o. male admitted on 01/23/2020 with LLE cellulitis that didn't improve on doxycycline.  Patient also has a history of R AKA with exposed bone at amputation site.  Pharmacy consulted to dose vancomycin.  Patient is immunocompromised.  Discussed antibiotic LOT with both [facility] and [facility] today. Per discussion with [facility] - no longer needed for stump site since has received ~48h post-op coverage. [facility] still treating L-foot cellulitis and wants full 7d course. Oral options reviewed but with extensive allergies listed and failed Doxy PTA so decision was made to add stop date for 1/21 on Vancomycin to complete the 7d course.   Plan: - Adjust Vanc to 500 mg IV every 24 hours (est AUC 400, Vd 0.72) - Stop date added on 1/21 to complete 7d course for L-foot cellulitis - Monitor renal fxn, clinical progress, vanc peak/through as indicated   Height: 5\' 8"  (172.7 cm) Weight: 52.2 kg (115 lb 1.3 oz) IBW/kg (Calculated) : 68.4  Temp (24hrs), Avg:98 F (36.7 C), Min:97.8 F (36.6 C), Max:98.3 F (36.8 C)  Recent Labs  Lab 01/23/20 1957 01/24/20 1546 01/25/20 0147 01/26/20 0121 01/27/20 0313 01/28/20 0239 01/29/20 0124  WBC 12.0*   < > 9.7 9.9 7.4 12.2* 12.2*  CREATININE 1.46*   < > 1.25* 1.19 1.01 0.99 1.07  LATICACIDVEN 1.6  --   --   --   --   --   --    < > = values in this interval not displayed.    Estimated Creatinine Clearance: 35.2 mL/min (by C-G formula based on SCr of 1.07 mg/dL).    Allergies  Allergen Reactions  . Cefdinir Diarrhea and Other (See Comments)    Severe Diarrhea  . Nitrofurantoin Swelling and Other (See Comments)    Hand Swelling  . Sulfa Antibiotics Anaphylaxis and Swelling  . Sulfonamide Derivatives Swelling and Other (See Comments)    Facial/tongue swelling  . Tape Other (See Comments)    SKIN IS VERY THIN AND TEARS EASILY!!!!! Please do NOT use "plastic" tape- USE PAPER!!  . Ciprofloxacin Itching, Rash and Other (See  Comments)    Red itchy hands  . Nitrofurantoin Swelling and Other (See Comments)    Swollen hands  . Amoxicillin Er Other (See Comments)    Frequest urination. Pt reports "not really" allergic 01/24/20  . Doxycycline Other (See Comments)    "Felt terrible" Pt wife reports "not really" allergic 01/24/20  . Levaquin [Levofloxacin] Itching and Rash  . Sertraline Anxiety and Other (See Comments)    Makes the patient jittery    Vanc 1/15 >> Doxy PTA 1/15 >> 1/15  1/15 BCx - NGTD  Thank you for allowing pharmacy to be a part of this patient's care.  Alycia Rossetti, PharmD, BCPS Clinical Pharmacist Clinical phone for 01/29/2020: [phone] 01/29/2020 8:28 AM   **Pharmacist phone directory can now be found on [email or web address] ([email or web address]).  Listed under [place].

## 2020-01-29 NOTE — Progress Notes (Signed)
Nutrition Follow-up  DOCUMENTATION CODES:   Underweight,Non-severe (moderate) malnutrition in context of chronic illness  INTERVENTION:   -D/c Ensure Enlive po BID, each supplement provides 350 kcal and 20 grams of protein -Continue MVI with minerals daily -Boost Breeze po TID, each supplement provides 250 kcal and 9 grams of protein  NUTRITION DIAGNOSIS:   Moderate Malnutrition related to chronic illness (COPD) as evidenced by percent weight loss,mild fat depletion,moderate fat depletion.  Ongoing  GOAL:   Patient will meet greater than or equal to 90% of their needs  Progressing   MONITOR:   PO intake,Supplement acceptance,Labs,Weight trends,Skin,I & O's  REASON FOR ASSESSMENT:   Malnutrition Screening Tool    ASSESSMENT:   [name] is a 87 y.o. male with medical history significant for hypertension, hyperlipidemia, COPD, 2 L/min as well as trilogy, diastolic heart failure, status post right above-knee amputation, sacral decubitus ulcer, who comes in because of ongoing problems with his cellulitis of the left leg he has been seeing his primary care provider who has been managing him with outpatient antibiotics but it got worse and was sent to the emergency department 5 days ago for possible admission for cellulitis he was evaluated and found to have cellulitis but did not require inpatient care and was sent home on oral doxycycline.  He was then seen at the wound clinic left foot has continued to worsen despite antibiotics.  He was seen again  by his primary care provider yesterday who directed him to return to the emergency room for admission  1/18- s/p Revision of right above knee amputation   Reviewed I/O's: -500 ml x 24 hours and -3.9 L since admission  UOP: 500 ml x 24 hours  Pt sleeping soundly at time of visit. Pt wife at bedside and politely requested this RD note wake him.   Per pt wife, pt generally has a good appetite. He was consuming 3 meals per day at  home, which consisted of breakfast of cereal, eggs, and toast, lunch of sandwich, and dinner of a meat, starch, and vegetable. Pt's intake has been erratic here, as he finds "the meals too heavy". Pt wife has been assisting with meal ordering and has been able to find food items off the menu that better suit his preferences. Observed breakfast tray- pt consumed only a few bites of omelette and muffin this AM.   Pt UBW is around 134#. Pt wife reports that he has lost about 20 pounds over the past 3 weeks secondary to acute illness. Reviewed wt hx; pt has experienced a 17.8% wt loss over the past 6 months, which is significant for time frame.   Per pt wife, pt "hates Ensure", however, has been consuming about 1/2 of a carton daily since he is aware of its benefit. Pt has also complained that it gives him indigestion. RD reviewed other supplements on the formulary; pt wife thinks that pt may better accept Boost Breeze. RD provided pt wife with cold supplement to trial and she was very appreciative.   Highly suspect pt with malnutrition (noted mild to moderate depletions to temple, orbital, and buccal area). However, pt wife politely requested RD to defer nutrition-focused physical exam as he just got comfortable and did not sleep well last night secondary to issues with his foley catheter.   Medications reviewed and include vitamin D3, vitamin B-12, and miralax.  Labs reviewed: Na: 128.   Nutrition-Focused Physical Exam:  Flowsheet Row Most Recent Value  Orbital Region Mild depletion  Upper Arm Region Unable to assess  Thoracic and Lumbar Region Unable to assess  Buccal Region Moderate depletion  Temple Region Moderate depletion  Clavicle Bone Region Unable to assess  Clavicle and Acromion Bone Region Unable to assess  Scapular Bone Region Unable to assess  Dorsal Hand Unable to assess  Patellar Region Unable to assess  Anterior Thigh Region Unable to assess  Posterior Calf Region Unable to  assess  Edema (RD Assessment) Unable to assess  Hair Reviewed  Eyes Unable to assess  Mouth Unable to assess  Skin Unable to assess  Nails Unable to assess      Diet Order:   Diet Order            Diet Heart Room service appropriate? Yes; Fluid consistency: Thin  Diet effective now                 EDUCATION NEEDS:   Education needs have been addressed  Skin:  Skin Assessment: Skin Integrity Issues: Skin Integrity Issues:: Other (Comment) Stage II: coccyx Other: rt stump wound (rt AKA)  Last BM:  01/28/20  Height:   Ht Readings from Last 1 Encounters:  01/28/20 5\' 8"  (1.727 m)    Weight:   Wt Readings from Last 1 Encounters:  01/29/20 52.2 kg    Ideal Body Weight:  64.4 kg (adjusted for rt AKA)  BMI:  Body mass index is 17.5 kg/m.  Estimated Nutritional Needs:   Kcal:  1800-2000  Protein:  100-115 grams  Fluid:  > 1.8 L    Loistine Chance, RD, LDN, [place] Registered Dietitian II Certified Diabetes Care and Education Specialist Please refer to [facility] for RD and/or RD on-call/weekend/after hours pager

## 2020-01-29 NOTE — Progress Notes (Signed)
Physical Therapy Treatment Patient Details Name: [name] MRN: [id number] DOB: 31-Jan-1929 Today's Date: 01/29/2020    History of Present Illness Patient is a 87 y/o male who presents with Left foot cellulitis with failed outpatient antibiotics as well as Rt AKa stump wound with bone exposed. Awaiting discussion with vascular regarding surgery?  PMH includes COPD on home 02, CAD s/p CABG, CHF, PVD, PAF, HTN, HLD, depression/anxiety, HOH, Rt AKA. Patient s/p R AKA revision 1/18.    PT Comments    Session limited by lethargy, however able to perform squat pivot transfer to chair with modA. Patient required minA for bed mobility and increased time to perform. Performed functional exercises seated in recliner to strengthen L LE. Patient unable to lift R LE against gravity. Patient continues to be limited by generalized weakness, impaired balance, decreased activity tolerance, and impaired functional mobility. Continue to recommend HHPT following discharge if patient has available physical assistance at home.     Follow Up Recommendations  Home health PT;Supervision for mobility/OOB (continue HHPT)     Equipment Recommendations  None recommended by PT    Recommendations for Other Services       Precautions / Restrictions Precautions Precautions: Fall Precaution Comments: Rt AKA Required Braces or Orthoses: Other Brace Other Brace: post op shoe LLE Restrictions Weight Bearing Restrictions: Yes RLE Weight Bearing: Non weight bearing    Mobility  Bed Mobility Overal bed mobility: Needs Assistance Bed Mobility: Supine to Sit     Supine to sit: Min assist;HOB elevated     General bed mobility comments: assist to bring hips towards EOB, increased time required  Transfers Overall transfer level: Needs assistance Equipment used: None Transfers: Squat Pivot Transfers     Squat pivot transfers: Mod assist     General transfer comment: modA for squat pivot bed>chair, chair  positioning in front of patient to simulate home transfers  Ambulation/Gait             General Gait Details: deferred due to lethargy and need for assist for squat pivot   Stairs             Wheelchair Mobility    Modified Rankin (Stroke Patients Only)       Balance Overall balance assessment: History of Falls;Needs assistance Sitting-balance support: No upper extremity supported (foot supported) Sitting balance-Leahy Scale: Fair                                      Cognition Arousal/Alertness: Lethargic Behavior During Therapy: Flat affect Overall Cognitive Status: Within Functional Limits for tasks assessed                                 General Comments: Appears WFL, however pt very HOH.      Exercises General Exercises - Lower Extremity Long Arc Quad: Left;10 reps;Seated Straight Leg Raises:  (unable to lift R LE against gravity) Hip Flexion/Marching: Left;10 reps;Seated    General Comments        Pertinent Vitals/Pain Pain Assessment: Faces Faces Pain Scale: Hurts little more Pain Location: back, buttocks Pain Descriptors / Indicators: Grimacing Pain Intervention(s): Monitored during session;Repositioned    Home Living                      Prior Function  PT Goals (current goals can now be found in the care plan section) Acute Rehab PT Goals Patient Stated Goal: to get better PT Goal Formulation: With patient Time For Goal Achievement: 02/08/20 Potential to Achieve Goals: Good Progress towards PT goals: Not progressing toward goals - comment (required more assistance this session due to lethargy)    Frequency    Min 3X/week      PT Plan Current plan remains appropriate    Co-evaluation              AM-PAC PT "6 Clicks" Mobility   Outcome Measure  Help needed turning from your back to your side while in a flat bed without using bedrails?: A Little Help needed moving  from lying on your back to sitting on the side of a flat bed without using bedrails?: A Little Help needed moving to and from a bed to a chair (including a wheelchair)?: A Lot Help needed standing up from a chair using your arms (e.g., wheelchair or bedside chair)?: A Lot Help needed to walk in hospital room?: A Lot Help needed climbing 3-5 steps with a railing? : A Lot 6 Click Score: 14    End of Session Equipment Utilized During Treatment: Gait belt;Oxygen Activity Tolerance: Patient tolerated treatment well Patient left: in chair;with call bell/phone within reach;with chair alarm set;with family/visitor present Nurse Communication: Mobility status PT Visit Diagnosis: Unsteadiness on feet (R26.81);Difficulty in walking, not elsewhere classified (R26.2);Muscle weakness (generalized) (M62.81)     Time: 2563-8937 PT Time Calculation (min) (ACUTE ONLY): 41 min  Charges:  $Therapeutic Activity: 38-52 mins                     [name] A. Gilford Rile PT, DPT Acute Rehabilitation Services Pager [phone] Office [phone]    Alda Lea 01/29/2020, 5:23 PM

## 2020-01-29 NOTE — Progress Notes (Addendum)
  Progress Note    01/29/2020 7:34 AM 2 Days Post-Op  Subjective:  No complaints   Vitals:   01/28/20 1937 01/29/20 0253  BP: (!) 105/58 111/63  Pulse: (!) 101 88  Resp: 19 15  Temp: 98.3 F (36.8 C) 98 F (36.7 C)  SpO2: 100% 99%    Physical Exam: Incisions:  R AKA incision without drainage; viable skin edges; some erythema anteriorly; no palpable hematoma         CBC    Component Value Date/Time   WBC 12.2 (H) 01/29/2020 0124   RBC 2.28 (L) 01/29/2020 0124   HGB 7.5 (L) 01/29/2020 0124   HGB 10.6 (L) 09/22/2019 1556   HCT 21.9 (L) 01/29/2020 0124   HCT 31.7 (L) 09/22/2019 1556   PLT 167 01/29/2020 0124   PLT 258 09/22/2019 1556   MCV 96.1 01/29/2020 0124   MCV 93 09/22/2019 1556   MCH 32.9 01/29/2020 0124   MCHC 34.2 01/29/2020 0124   RDW 13.3 01/29/2020 0124   RDW 12.0 09/22/2019 1556   LYMPHSABS 0.4 (L) 01/29/2020 0124   LYMPHSABS 0.4 (L) 06/25/2019 1449   MONOABS 1.5 (H) 01/29/2020 0124   EOSABS 0.0 01/29/2020 0124   EOSABS 0.0 06/25/2019 1449   BASOSABS 0.0 01/29/2020 0124   BASOSABS 0.1 06/25/2019 1449    BMET    Component Value Date/Time   NA 128 (L) 01/29/2020 0124   NA 132 (L) 11/07/2019 1636   K 4.9 01/29/2020 0124   CL 95 (L) 01/29/2020 0124   CO2 26 01/29/2020 0124   GLUCOSE 109 (H) 01/29/2020 0124   BUN 19 01/29/2020 0124   BUN 23 11/07/2019 1636   CREATININE 1.07 01/29/2020 0124   CALCIUM 9.0 01/29/2020 0124   GFRNONAA >60 01/29/2020 0124   GFRAA 72 11/07/2019 1636    INR    Component Value Date/Time   INR 1.0 08/11/2019 0715     Intake/Output Summary (Last 24 hours) at 01/29/2020 0734 Last data filed at 01/28/2020 2100 Gross per 24 hour  Intake --  Output 500 ml  Net -500 ml     Assessment/Plan:  87 y.o. male is s/p right above knee amputation  2 Days Post-Op  - R AKA dressing changed; continue daily dressing changes.  Incision healing well, no hematoma; monitor erythema at mid incision.  Encouraged nutrition  and participation with PT/OT today   Dagoberto Ligas, PA-C [facility] [phone] 01/29/2020 7:34 AM   I have independently interviewed and examined patient and agree with PA assessment and plan above.  Patient with increased swelling of left lower extremity today as it was dependent.  He is subsequently elevated appears much better.  We have discussed his only option in the past to be left above-knee amputation which he has declined.  Right above-knee amputation site appears to be healing well.  Brandonlee Navis C. Donzetta Matters, MD [facility] [place] Office: [phone] Pager: [phone]

## 2020-01-29 NOTE — Telephone Encounter (Signed)
I am sorry to hear that.  Thank you for the information.  Glad to hear he is in the recovery phase.

## 2020-01-29 NOTE — Progress Notes (Signed)
RN from [facility] cath team came to replace pt's catheter. She noted that balloon only have 8.74mls. Refilled balloon with 63mls and pt was agreeable to see if that fixed leakage problem. If it does not she will come back and replace catheter.

## 2020-01-30 ENCOUNTER — Inpatient Hospital Stay (HOSPITAL_COMMUNITY): Payer: Medicare Other

## 2020-01-30 DIAGNOSIS — L03116 Cellulitis of left lower limb: Secondary | ICD-10-CM | POA: Diagnosis not present

## 2020-01-30 DIAGNOSIS — E440 Moderate protein-calorie malnutrition: Secondary | ICD-10-CM | POA: Insufficient documentation

## 2020-01-30 LAB — CBC WITH DIFFERENTIAL/PLATELET
Abs Immature Granulocytes: 0.06 10*3/uL (ref 0.00–0.07)
Basophils Absolute: 0 10*3/uL (ref 0.0–0.1)
Basophils Relative: 0 %
Eosinophils Absolute: 0 10*3/uL (ref 0.0–0.5)
Eosinophils Relative: 0 %
HCT: 20.1 % — ABNORMAL LOW (ref 39.0–52.0)
Hemoglobin: 7 g/dL — ABNORMAL LOW (ref 13.0–17.0)
Immature Granulocytes: 1 %
Lymphocytes Relative: 3 %
Lymphs Abs: 0.3 10*3/uL — ABNORMAL LOW (ref 0.7–4.0)
MCH: 33.2 pg (ref 26.0–34.0)
MCHC: 34.8 g/dL (ref 30.0–36.0)
MCV: 95.3 fL (ref 80.0–100.0)
Monocytes Absolute: 1 10*3/uL (ref 0.1–1.0)
Monocytes Relative: 9 %
Neutro Abs: 9.2 10*3/uL — ABNORMAL HIGH (ref 1.7–7.7)
Neutrophils Relative %: 87 %
Platelets: 158 10*3/uL (ref 150–400)
RBC: 2.11 MIL/uL — ABNORMAL LOW (ref 4.22–5.81)
RDW: 13.5 % (ref 11.5–15.5)
WBC: 10.6 10*3/uL — ABNORMAL HIGH (ref 4.0–10.5)
nRBC: 0 % (ref 0.0–0.2)

## 2020-01-30 LAB — COMPREHENSIVE METABOLIC PANEL
ALT: 15 U/L (ref 0–44)
AST: 24 U/L (ref 15–41)
Albumin: 2.9 g/dL — ABNORMAL LOW (ref 3.5–5.0)
Alkaline Phosphatase: 44 U/L (ref 38–126)
Anion gap: 11 (ref 5–15)
BUN: 21 mg/dL (ref 8–23)
CO2: 25 mmol/L (ref 22–32)
Calcium: 8.7 mg/dL — ABNORMAL LOW (ref 8.9–10.3)
Chloride: 94 mmol/L — ABNORMAL LOW (ref 98–111)
Creatinine, Ser: 1.07 mg/dL (ref 0.61–1.24)
GFR, Estimated: >60 mL/min (ref >60)
Glucose, Bld: 111 mg/dL — ABNORMAL HIGH (ref 70–99)
Potassium: 4.1 mmol/L (ref 3.5–5.1)
Sodium: 130 mmol/L — ABNORMAL LOW (ref 135–145)
Total Bilirubin: 0.7 mg/dL (ref 0.3–1.2)
Total Protein: 4.6 g/dL — ABNORMAL LOW (ref 6.5–8.1)

## 2020-01-30 LAB — MAGNESIUM: Magnesium: 1.8 mg/dL (ref 1.7–2.4)

## 2020-01-30 LAB — SEDIMENTATION RATE: Sed Rate: 22 mm/hr — ABNORMAL HIGH (ref 0–16)

## 2020-01-30 LAB — C-REACTIVE PROTEIN: CRP: 6.9 mg/dL — ABNORMAL HIGH (ref <1.0)

## 2020-01-30 MED ORDER — BUDESONIDE-FORMOTEROL FUMARATE 160-4.5 MCG/ACT IN AERO
2.0000 | INHALATION_SPRAY | Freq: Two times a day (BID) | RESPIRATORY_TRACT | Status: DC
  Administered 2020-01-30 – 2020-02-05 (×7): 2 via RESPIRATORY_TRACT

## 2020-01-30 MED ORDER — TIOTROPIUM BROMIDE MONOHYDRATE 2.5 MCG/ACT IN AERS
2.0000 | INHALATION_SPRAY | Freq: Every day | RESPIRATORY_TRACT | Status: DC
  Administered 2020-01-30 – 2020-02-05 (×5): 2 via RESPIRATORY_TRACT
  Filled 2020-01-30 (×2): qty 1

## 2020-01-30 MED ORDER — SENNOSIDES-DOCUSATE SODIUM 8.6-50 MG PO TABS
1.0000 | ORAL_TABLET | Freq: Every day | ORAL | Status: DC
  Filled 2020-01-30 (×2): qty 1

## 2020-01-30 MED ORDER — ALPRAZOLAM 0.25 MG PO TABS
0.2500 mg | ORAL_TABLET | Freq: Three times a day (TID) | ORAL | Status: DC | PRN
  Administered 2020-01-30 – 2020-02-05 (×14): 0.25 mg via ORAL
  Filled 2020-01-30 (×15): qty 1

## 2020-01-30 MED ORDER — GUAIFENESIN ER 600 MG PO TB12
600.0000 mg | ORAL_TABLET | Freq: Two times a day (BID) | ORAL | Status: DC
  Administered 2020-01-30 – 2020-02-04 (×9): 600 mg via ORAL
  Filled 2020-01-30 (×13): qty 1

## 2020-01-30 MED ORDER — LEVALBUTEROL HCL 0.63 MG/3ML IN NEBU
0.6300 mg | INHALATION_SOLUTION | Freq: Four times a day (QID) | RESPIRATORY_TRACT | Status: DC
  Administered 2020-01-30 (×2): 0.63 mg via RESPIRATORY_TRACT
  Filled 2020-01-30 (×5): qty 3

## 2020-01-30 MED ORDER — METHYLPREDNISOLONE SODIUM SUCC 40 MG IJ SOLR
40.0000 mg | Freq: Once | INTRAMUSCULAR | Status: AC
  Administered 2020-01-30: 40 mg via INTRAVENOUS
  Filled 2020-01-30: qty 1

## 2020-01-30 NOTE — Progress Notes (Addendum)
[facility] [place]  Subjective  - Resting well this am   Objective 108/63 83 98 F (36.7 C) (Oral) 18 100%  Intake/Output Summary (Last 24 hours) at 01/30/2020 2992 Last data filed at 01/29/2020 2300 Gross per 24 hour  Intake 343.25 ml  Output 1650 ml  Net -1306.75 ml    Right AKA with ecchymosis and ss drainage No evidence of hematoma  Assessment/Planning: POD # 3 revision AKA  Skin edges with ecchymosis, slight erythema Will watch for changes, staples intact Left open to air. The dressing can be reapplied when he is awake later this am by RN PT currently recommending HH  Roxy Horseman 01/30/2020 8:07 AM --  Laboratory Lab Results: Recent Labs    01/29/20 0124 01/30/20 0223  WBC 12.2* 10.6*  HGB 7.5* 7.0*  HCT 21.9* 20.1*  PLT 167 158   BMET Recent Labs    01/29/20 0124 01/30/20 0223  NA 128* 130*  K 4.9 4.1  CL 95* 94*  CO2 26 25  GLUCOSE 109* 111*  BUN 19 21  CREATININE 1.07 1.07  CALCIUM 9.0 8.7*    COAG Lab Results  Component Value Date   INR 1.0 08/11/2019   INR 1.0 06/27/2019   INR 1.06 04/04/2017   No results found for: PTT  I have independently interviewed and examined patient and agree with PA assessment and plan above.  Above-knee amputation healing well.  Vascular surgeon on-call will be available over weekend I will otherwise revisit with patient next Monday.  Reata Petrov C. Donzetta Matters, MD [facility] [place] Office: [phone] Pager: [phone]

## 2020-01-30 NOTE — Plan of Care (Signed)
  Problem: Education: Goal: Knowledge of General Education information will improve Description: Including pain rating scale, medication(s)/side effects and non-pharmacologic comfort measures Outcome: Progressing   Problem: Health Behavior/Discharge Planning: Goal: Ability to manage health-related needs will improve Outcome: Progressing   Problem: Nutrition: Goal: Adequate nutrition will be maintained Outcome: Progressing   

## 2020-01-30 NOTE — Progress Notes (Signed)
PROGRESS NOTE    [name]  MIW:[id number] DOB: 19-Dec-1929 DOA: 01/23/2020 PCP: [name], MD    Chief Complaint  Patient presents with  . Wound Check    Brief Narrative:  87 year old male with history of hypertension, hyperlipidemia, COPD, chronic hypoxic respiratory failure requiring 2 L/min nasal cannula as well as trilogy, chronic diastolic heart failure, status post right AKA presented with worsening left foot pain and redness not improving with outpatient doxycycline. On presentation, he had left foot redness going towards the ankle and was started on IV antibiotics.  Subjective:  Foley was not changed, RN reports the balloon was deflated, which was corrected, this morning there is some hematuria in foley bag, the urine in the tubing is clear He reports new onset of cough, wheezing this am, denies chest pain, no sob, on 2liter oxygen which is baseline He prefers his home med symbicort and spiriva, he does not want hospital substitue  Reports left foot /ankle remain  swollen ,  Erythema hs improved compare to yesterday  Some blood from the stump He c/o feeling drowsy, agreed to change xanxax to 0.28m tid prn, ( he reports his pulmonologist increased xanax to 0.5 tid prn a few weeks ago because he thought he was more anxious)  Hard of hearing, wife at bedside   Assessment & Plan:   Principal Problem:   Cellulitis of left foot Active Problems:   Hyperlipidemia   Obstructive sleep apnea   Chronic respiratory failure with hypoxia and hypercapnia (HCC)   [place]   Sacral decubitus ulcer   Cellulitis and abscess of left lower extremity   Malnutrition of moderate degree   Right AKA stump wound/peripheral vascular disease -MRI did not show osteomyelitis of the right AKA site -Status post revision surgery on January 18, wound care  per vascular surgery -per vascular surgery does not need abx for right leg -will follow vascular surgery  recommendation  Left foot cellulitis -Blood culture no growth, MRSA screening negative, SARS-CoV-2 screening negative -Left foot MRI did not show sign of osteomyelitis -Failed outpatient doxycycline, history of staph lugdunensis right foot sensitive to vanc/doxy/bactrim, he has multiple abx allergies/side effects, has been on Vanco since admission will finish for total 7 days treatment -left foot look worse on 1/20, - esr/crp slightly elevated, added augmentin, continue vanc, left foot better today, wbc better today.  -Case discussed with vascular surgery regarding left foot, Dr CDonzetta Mattersstates patient declined amputation to left foot, -continue conservative management for now   Chest pain with mild positive troponin in a patient with history of CAD status post CABG -Patient had chest pain on 01/26/2020 with only minimally elevated troponin which did not significantly trend upwards.   -Echocardiogram ordered by cardiology which is unremarkable -Currently on aspirin, Lipitor, Lopressor -Currently chest pain-free, cardiology  recommend medical management, cardiology input appreciated  Chronic diastolic CHF Home medication Lasix and spironolactone held due to hypotension and AKI -AKI has resolved, resume diuretics  NSVT: Keep on tele, continue betablocker, keep K >4, mag>2  Hyponatremia Appear chronic ,  may be from heart failure, resume Lasix and spironolactone, monitor BMP  AKI BUN 33/creatinine 1. 4 6 on presentation -AKI resolved  Hypertension Presented with hypotension, now blood pressure is improving, resume home blood pressure medication and diuretics, monitor blood pressure  COPD, home O2 dependent,  on chronic prednisone 143mdaily This morning started coughing and wheezing Will get cxr, sputum culture, add on mucinex, schedule nebs, start solumedrol  for copd flares  Hypothyroidism Continue Synthroid  OSA on CPAP nightly  Indwelling foley, last changed on 1/7, he follows Dr  Junious Silk He wants foley changed on 1/20 due to leakage, seems better after RN reinflate the balloon, continue to monitor he wants brand name [email or web address] from home ,pharmacy verified it Had hematuria today, wife report it happens about one a month, will continue monitor  Normocytic anemia Like from blood loss from right stump and hematuria, monitor cbc transfuse if hgb less than 7     DVT prophylaxis: Place TED hose Start: 01/24/20 1314   Code Status: Full Family Communication: Wife at bedside Disposition:   Status is: Inpatient   Dispo: The patient is from: Home              Anticipated d/c is to: CIR versus skilled nursing facility pending PT eval              Anticipated d/c date is: Pending PT eval                Consultants:   Cardiology  Vascular surgery  Palliative care  Procedures:   Revision of right above-knee amputation on January 18 by Dr. Donzetta Matters  Antimicrobials:    Vancomycin augmentin    Objective: Vitals:   01/29/20 1945 01/30/20 0338 01/30/20 0446 01/30/20 0906  BP: 113/66 108/63  114/79  Pulse: 98 83  89  Resp: _0 Temp: (!) 97.5 F (36.4 C) 98 F (36.7 C)  97.8 F (36.6 C)  TempSrc: Oral Oral  Oral  SpO2: 99% 100%  99%  Weight:   52.6 kg   Height:        Intake/Output Summary (Last 24 hours) at 01/30/2020 1238 Last data filed at 01/29/2020 2300 Gross per 24 hour  Intake 343.25 ml  Output 1500 ml  Net -1156.75 ml   Filed Weights   01/28/20 0859 01/29/20 0500 01/30/20 0446  Weight: 52.3 kg 52.2 kg 52.6 kg    Examination:  General exam: Frail, hard of hearing calm, NAD Respiratory system: bilateral wheezing ( which is new today). Respiratory effort normal. Cardiovascular system: S1 & S2 heard, RRR. No JVD, no murmur, No pedal edema. Gastrointestinal system: Abdomen is nondistended, soft and nontender.  Normal bowel sounds heard. Central nervous system: Alert and oriented. No focal neurological deficits. Extremities: Right  AKA, dressing intact, left foot less edema , less erythema, no open wound, Skin: No rashes, lesions or ulcers Psychiatry: Judgement and insight appear normal. Mood & affect appropriate.       Data Reviewed: I have personally reviewed following labs and imaging studies  CBC: Recent Labs  Lab 01/26/20 0121 01/27/20 0313 01/28/20 0239 01/29/20 0124 01/30/20 0223  WBC 9.9 7.4 12.2* 12.2* 10.6*  NEUTROABS 8.7* 5.9 10.4* 10.1* 9.2*  HGB 10.0* 9.6* 7.8* 7.5* 7.0*  HCT 31.0* 28.5* 23.3* 21.9* 20.1*  MCV 95.7 95.6 96.3 96.1 95.3  PLT 193 176 163 167 488    Basic Metabolic Panel: Recent Labs  Lab 01/26/20 0121 01/27/20 0313 01/28/20 0239 01/29/20 0124 01/30/20 0223  NA 132* 134* 131* 128* 130*  K 4.4 4.4 4.3 4.9 4.1  CL 96* 99 98 95* 94*  CO2 _1 GLUCOSE 97 95 124* 109* 111*  BUN 25* _2 CREATININE 1.19 1.01 0.99 1.07 1.07  CALCIUM 8.6* 9.0 8.8* 9.0 8.7*  MG 1.9 2.1 1.9  --  1.8    GFR:  Estimated Creatinine Clearance: 35.5 mL/min (by C-G formula based on SCr of 1.07 mg/dL).  Liver Function Tests: Recent Labs  Lab 01/23/20 1957 01/25/20 0147 01/30/20 0223  AST _0 ALT 46* 33 15  ALKPHOS 61 55 44  BILITOT 0.9 1.0 0.7  PROT 6.3* 5.2* 4.6*  ALBUMIN 3.9 3.2* 2.9*    CBG: No results for input(s): GLUCAP in the last 168 hours.   Recent Results (from the past 240 hour(s))  SARS CORONAVIRUS 2 (TAT 6-24 HRS) Nasopharyngeal Nasopharyngeal Swab     Status: None   Collection Time: 01/24/20  6:30 AM   Specimen: Nasopharyngeal Swab  Result Value Ref Range Status   SARS Coronavirus 2 NEGATIVE NEGATIVE Final    Comment: (NOTE) SARS-CoV-2 target nucleic acids are NOT DETECTED.  The SARS-CoV-2 RNA is generally detectable in upper and lower respiratory specimens during the acute phase of infection. Negative results do not preclude SARS-CoV-2 infection, do not rule out co-infections with other pathogens, and should not be used as the sole  basis for treatment or other patient management decisions. Negative results must be combined with clinical observations, patient history, and epidemiological information. The expected result is Negative.  Fact Sheet for Patients: SugarRoll.be  Fact Sheet for Healthcare Providers: https://www.woods-mathews.com/  This test is not yet approved or cleared by the [place] FDA and  has been authorized for detection and/or diagnosis of SARS-CoV-2 by FDA under an Emergency Use Authorization (EUA). This EUA will remain  in effect (meaning this test can be used) for the duration of the COVID-19 declaration under Se ction 564(b)(1) of the Act, 21 U.S.C. section 360bbb-3(b)(1), unless the authorization is terminated or revoked sooner.  Performed at [facility], [place] [address], [place], [place] [address]   Culture, blood (routine x 2)     Status: None   Collection Time: 01/24/20  3:46 PM   Specimen: BLOOD  Result Value Ref Range Status   Specimen Description BLOOD LEFT ANTECUBITAL  Final   Special Requests   Final    BOTTLES DRAWN AEROBIC AND ANAEROBIC Blood Culture adequate volume   Culture   Final    NO GROWTH 5 DAYS Performed at [facility], [place] [address], [place], [place] [address]    Report Status 01/29/2020 FINAL  Final  Culture, blood (routine x 2)     Status: None   Collection Time: 01/24/20  3:48 PM   Specimen: BLOOD  Result Value Ref Range Status   Specimen Description BLOOD RIGHT ANTECUBITAL  Final   Special Requests   Final    BOTTLES DRAWN AEROBIC AND ANAEROBIC Blood Culture results may not be optimal due to an inadequate volume of blood received in culture bottles   Culture   Final    NO GROWTH 5 DAYS Performed at [facility], [place] [address], [place], [place] [address]    Report Status 01/29/2020 FINAL  Final  Surgical PCR screen     Status: None   Collection Time: 01/27/20  6:03 AM   Specimen: Nasal  Mucosa; Nasal Swab  Result Value Ref Range Status   MRSA, PCR NEGATIVE NEGATIVE Final   Staphylococcus aureus NEGATIVE NEGATIVE Final    Comment: (NOTE) The Xpert SA Assay (FDA approved for NASAL specimens in patients 43 years of age and older), is one component of a comprehensive surveillance program. It is not intended to diagnose infection nor to guide or monitor treatment. Performed at [facility],  [address] [place], [place] [address]          Radiology Studies: No results found.      Scheduled Meds: . amoxicillin-clavulanate  1 tablet Oral Q12H  . aspirin EC  81 mg Oral Daily  . atorvastatin  40 mg Oral QHS  . Chlorhexidine Gluconate Cloth  6 each Topical Daily  . cholecalciferol  2,000 Units Oral Q lunch  . collagenase   Topical Daily  . feeding supplement  1 Container Oral TID BM  . finasteride  5 mg Oral Q lunch  . fluticasone  2 spray Each Nare Daily  . fluticasone furoate-vilanterol  1 puff Inhalation Daily  . guaiFENesin  600 mg Oral BID  . levalbuterol  0.63 mg Nebulization Q6H  . levothyroxine  50 mcg Oral Daily  . methylPREDNISolone (SOLU-MEDROL) injection  40 mg Intravenous Once  . metoprolol succinate  12.5 mg Oral Daily  . mirabegron ER  25 mg Oral Daily  . multivitamin with minerals  1 tablet Oral Daily  . mupirocin ointment  1 application Nasal BID  . pantoprazole  40 mg Oral Daily  . polyethylene glycol  17 g Oral Daily  . predniSONE  10 mg Oral Daily  . saccharomyces boulardii  250 mg Oral BID  . spironolactone  25 mg Oral Daily  . umeclidinium bromide  1 puff Inhalation Daily  . vitamin B-12  1,000 mcg Oral Daily   Continuous Infusions: . vancomycin 500 mg (01/29/20 1302)     LOS: 6 days   Time spent: 35 mins Greater than 50% of this time was spent in counseling, explanation of diagnosis, planning of further management, and coordination of care.  I have personally reviewed and interpreted on  01/30/2020 daily labs, tele  strips, I reviewed all nursing notes, pharmacy notes, consultant notes,  vitals, pertinent old records  I have discussed plan of care as described above with RN , patient and family on 01/30/2020  Voice Recognition /Dragon dictation system was used to create this note, attempts have been made to correct errors. Please contact the author with questions and/or clarifications.   Florencia Reasons, MD PhD FACP [facility]  Available via Epic secure chat 7am-7pm for nonurgent issues Please page for urgent issues To page the attending provider between 7A-7P or the covering provider during after hours 7P-7A, please log into the web site [email or web address] and access using universal [place] password for that web site. If you do not have the password, please call the hospital operator.    01/30/2020, 12:38 PM

## 2020-01-30 NOTE — Progress Notes (Signed)
Physical Therapy Treatment Patient Details Name: [name] MRN: [id number] DOB: 10-24-1929 Today's Date: 01/30/2020    History of Present Illness Patient is a 87 y/o male who presents with Left foot cellulitis with failed outpatient antibiotics as well as Rt AKa stump wound with bone exposed. Awaiting discussion with vascular regarding surgery?  PMH includes COPD on home 02, CAD s/p CABG, CHF, PVD, PAF, HTN, HLD, depression/anxiety, HOH, Rt AKA. Patient s/p R AKA revision 1/18.    PT Comments    Session limited by lethargy and patient complaints of feeling weak, noted Hgb 7.0 this date. Patient highly motivated to work with therapy although disappointed that he didn't feel well this session. Patient agreeable to bed level exercises to promote B LE strengthening. Patient continues to be limited by decreased activity tolerance, impaired balance, generalized weakness, and impaired functional mobility. Recommend CIR for intensive therapies as patient was independent with transfers PTA and highly motivated to progress and get back to PLOF.    Follow Up Recommendations  CIR     Equipment Recommendations  None recommended by PT    Recommendations for Other Services Rehab consult     Precautions / Restrictions Precautions Precautions: Fall Precaution Comments: Rt AKA Required Braces or Orthoses: Other Brace Other Brace: post op shoe LLE Restrictions Weight Bearing Restrictions: Yes RLE Weight Bearing: Non weight bearing    Mobility  Bed Mobility Overal bed mobility: Needs Assistance Bed Mobility: Rolling Rolling: Mod assist         General bed mobility comments: modA for rolling to reposition in partial R sidelying to offload buttocks  Transfers                 General transfer comment: deferred due to lethargy and patient complaints of feeling weak  Ambulation/Gait                 Stairs             Wheelchair Mobility    Modified Rankin (Stroke  Patients Only)       Balance                                            Cognition Arousal/Alertness: Lethargic Behavior During Therapy: Flat affect Overall Cognitive Status: Within Functional Limits for tasks assessed                                 General Comments: Appears WFL, however pt very HOH.      Exercises General Exercises - Lower Extremity Ankle Circles/Pumps: AROM;Left;10 reps;Supine Short Arc Quad: AROM;Left;10 reps;Supine Heel Slides: AROM;Left;10 reps;Supine Straight Leg Raises: AROM;Left;10 reps;Supine Hip Flexion/Marching: Right;10 reps;Supine    General Comments General comments (skin integrity, edema, etc.): wife present during session. Provided patient with IS and instructed on use and frequency. Educated patient and wife on how to don prevalon boot on L foot      Pertinent Vitals/Pain Pain Assessment: Faces Faces Pain Scale: Hurts little more Pain Location: back, buttocks Pain Descriptors / Indicators: Grimacing Pain Intervention(s): Monitored during session;Repositioned    Home Living                      Prior Function            PT Goals (current goals  can now be found in the care plan section) Acute Rehab PT Goals Patient Stated Goal: to get better PT Goal Formulation: With patient Time For Goal Achievement: 02/08/20 Potential to Achieve Goals: Good Progress towards PT goals: Progressing toward goals    Frequency    Min 3X/week      PT Plan Current plan remains appropriate    Co-evaluation              AM-PAC PT "6 Clicks" Mobility   Outcome Measure  Help needed turning from your back to your side while in a flat bed without using bedrails?: A Little Help needed moving from lying on your back to sitting on the side of a flat bed without using bedrails?: A Little Help needed moving to and from a bed to a chair (including a wheelchair)?: A Lot Help needed standing up from a chair  using your arms (e.g., wheelchair or bedside chair)?: A Lot Help needed to walk in hospital room?: A Lot Help needed climbing 3-5 steps with a railing? : A Lot 6 Click Score: 14    End of Session Equipment Utilized During Treatment: Oxygen Activity Tolerance: Patient limited by lethargy Patient left: in bed;with call bell/phone within reach;with family/visitor present Nurse Communication: Mobility status PT Visit Diagnosis: Unsteadiness on feet (R26.81);Difficulty in walking, not elsewhere classified (R26.2);Muscle weakness (generalized) (M62.81)     Time: 0277-4128 PT Time Calculation (min) (ACUTE ONLY): 38 min  Charges:  $Therapeutic Exercise: 23-37 mins $Therapeutic Activity: 8-22 mins                     Omarri Eich A. Gilford Rile PT, DPT Acute Rehabilitation Services Pager [phone] Office [phone]    Alda Lea 01/30/2020, 5:16 PM

## 2020-01-30 NOTE — Care Management Important Message (Signed)
Important Message  Patient Details  Name: [name] MRN: [id number] Date of Birth: 17-Nov-1929   Medicare Important Message Given:  Yes     Braya Habermehl P [place] 01/30/2020, 2:39 PM

## 2020-01-30 NOTE — Progress Notes (Signed)
[facility])  [name] is our current outpatient palliative patient in the community.  [facility] will continue to follow while hospitalized and assist in any way we can.  Venia Carbon RN, BSN, [facility] Liaison

## 2020-01-31 DIAGNOSIS — L03116 Cellulitis of left lower limb: Secondary | ICD-10-CM | POA: Diagnosis not present

## 2020-01-31 LAB — CBC WITH DIFFERENTIAL/PLATELET
Abs Immature Granulocytes: 0.05 10*3/uL (ref 0.00–0.07)
Basophils Absolute: 0 10*3/uL (ref 0.0–0.1)
Basophils Relative: 0 %
Eosinophils Absolute: 0 10*3/uL (ref 0.0–0.5)
Eosinophils Relative: 0 %
HCT: 19.1 % — ABNORMAL LOW (ref 39.0–52.0)
Hemoglobin: 6.6 g/dL — CL (ref 13.0–17.0)
Immature Granulocytes: 1 %
Lymphocytes Relative: 2 %
Lymphs Abs: 0.1 10*3/uL — ABNORMAL LOW (ref 0.7–4.0)
MCH: 32.4 pg (ref 26.0–34.0)
MCHC: 34.6 g/dL (ref 30.0–36.0)
MCV: 93.6 fL (ref 80.0–100.0)
Monocytes Absolute: 0.3 10*3/uL (ref 0.1–1.0)
Monocytes Relative: 4 %
Neutro Abs: 5.6 10*3/uL (ref 1.7–7.7)
Neutrophils Relative %: 93 %
Platelets: 180 10*3/uL (ref 150–400)
RBC: 2.04 MIL/uL — ABNORMAL LOW (ref 4.22–5.81)
RDW: 13.2 % (ref 11.5–15.5)
WBC: 6 10*3/uL (ref 4.0–10.5)
nRBC: 0 % (ref 0.0–0.2)

## 2020-01-31 LAB — BASIC METABOLIC PANEL
Anion gap: 8 (ref 5–15)
BUN: 17 mg/dL (ref 8–23)
CO2: 27 mmol/L (ref 22–32)
Calcium: 8.6 mg/dL — ABNORMAL LOW (ref 8.9–10.3)
Chloride: 91 mmol/L — ABNORMAL LOW (ref 98–111)
Creatinine, Ser: 0.86 mg/dL (ref 0.61–1.24)
GFR, Estimated: >60 mL/min (ref >60)
Glucose, Bld: 136 mg/dL — ABNORMAL HIGH (ref 70–99)
Potassium: 4.3 mmol/L (ref 3.5–5.1)
Sodium: 126 mmol/L — ABNORMAL LOW (ref 135–145)

## 2020-01-31 LAB — PREPARE RBC (CROSSMATCH)

## 2020-01-31 LAB — MAGNESIUM: Magnesium: 2 mg/dL (ref 1.7–2.4)

## 2020-01-31 LAB — EXPECTORATED SPUTUM ASSESSMENT W GRAM STAIN, RFLX TO RESP C

## 2020-01-31 MED ORDER — POLYETHYLENE GLYCOL 3350 17 G PO PACK
17.0000 g | PACK | Freq: Two times a day (BID) | ORAL | Status: DC
  Administered 2020-01-31 – 2020-02-02 (×4): 17 g via ORAL
  Filled 2020-01-31 (×9): qty 1

## 2020-01-31 MED ORDER — PREDNISONE 10 MG PO TABS
10.0000 mg | ORAL_TABLET | Freq: Every day | ORAL | Status: DC
  Administered 2020-02-01 – 2020-02-05 (×5): 10 mg via ORAL
  Filled 2020-01-31 (×5): qty 1

## 2020-01-31 MED ORDER — SODIUM CHLORIDE 0.9% IV SOLUTION: Freq: Once | INTRAVENOUS | Status: AC

## 2020-01-31 MED ORDER — PREDNISONE 20 MG PO TABS
20.0000 mg | ORAL_TABLET | Freq: Every day | ORAL | Status: DC
  Administered 2020-01-31: 20 mg via ORAL
  Filled 2020-01-31: qty 1

## 2020-01-31 NOTE — Progress Notes (Addendum)
PROGRESS NOTE    [name]  ZOX:[id number] DOB: [birth year] DOA: 01/23/2020 PCP: [name], MD    Chief Complaint  Patient presents with  . Wound Check    Brief Narrative:  87 year old male with history of hypertension, hyperlipidemia, COPD, chronic hypoxic respiratory failure requiring 2 L/min nasal cannula as well as trilogy, chronic diastolic heart failure, status post right AKA presented with worsening left foot pain and redness not improving with outpatient doxycycline. On presentation, he had left foot redness going towards the ankle and was started on IV antibiotics.  Subjective:  No wheezing this morning, still some congested cough denies chest pain, no sob, on 2liter oxygen which is baseline No bm for three days hgb dropped, he declined prbc transfusion this am, now he agrees to it  Hard of hearing, wife at bedside   Assessment & Plan:   Principal Problem:   Cellulitis of left foot Active Problems:   Hyperlipidemia   Obstructive sleep apnea   Chronic respiratory failure with hypoxia and hypercapnia (HCC)   Peripheral vascular disease (HCC)   Sacral decubitus ulcer   Cellulitis and abscess of left lower extremity   Malnutrition of moderate degree   Right AKA stump wound/peripheral vascular disease -MRI did not show osteomyelitis of the right AKA site -Status post revision surgery on January 18, wound care  per vascular surgery -per vascular surgery does not need abx for right leg -will follow vascular surgery recommendation  Left foot cellulitis -Blood culture no growth, MRSA screening negative, SARS-CoV-2 screening negative -Left foot MRI did not show sign of osteomyelitis -Failed outpatient doxycycline, history of staph lugdunensis right foot sensitive to vanc/doxy/bactrim, he has multiple abx allergies/side effects, has been on Vanco since admission will finish for total 7 days treatment -left foot look worse on 1/20, - esr/crp slightly elevated,  added augmentin, continue vanc, left foot better now, wbc normalized today -Case discussed with vascular surgery regarding left foot, Dr Donzetta Matters states patient declined amputation to left foot, -continue conservative management for now   Chest pain with mild positive troponin in a patient with history of CAD status post CABG -Patient had chest pain on 01/26/2020 with only minimally elevated troponin which did not significantly trend upwards.   -Echocardiogram ordered by cardiology which is unremarkable -Currently on aspirin, Lipitor, Lopressor -Currently chest pain-free, cardiology  recommend medical management, cardiology input appreciated  Chronic diastolic CHF Home medication Lasix and spironolactone held due to hypotension and AKI -AKI has resolved, resume diuretics  NSVT: Keep on tele, continue betablocker, keep K >4, mag>2  Hyponatremia Appear chronic ,  may be from heart failure, resume Lasix and spironolactone, monitor BMP  AKI BUN 33/creatinine 1. 4 6 on presentation -AKI resolved  Hypertension Presented with hypotension, now blood pressure is improving, resume home blood pressure medication and diuretics, monitor blood pressure  COPD, home O2 dependent  (2liters),  on chronic prednisone 10mg  daily started coughing and wheezing on 1/21, cxr no acute findings, received one dose of solumedrol on 1/21, wheezing resolved this am, will give prednisone 20mg  today, continue mucinex, schedule nebs,  Incentive spirometer  Hypothyroidism Continue Synthroid  OSA on CPAP nightly  Indwelling foley, last changed on 1/7, he follows Dr Junious Silk He wants foley changed on 1/20 due to leakage, seems better after RN reinflate the balloon, continue to monitor he wants brand name myrbetriq from home ,pharmacy verified it Had hematuria on 1/21, urine clear today,  Normocytic anemia Like from blood loss from right stump  and hematuria, monitor cbc transfuse if hgb less than 7 prbcx1 on  1/22  Constipation Increase miralax, add sennokot     DVT prophylaxis: Place TED hose Start: 01/24/20 1314   Code Status: Full Family Communication: Wife at bedside Disposition:   Status is: Inpatient   Dispo: The patient is from: Home              Anticipated d/c is to: CIR versus skilled nursing facility pending PT eval              Anticipated d/c date is: Pending PT eval                Consultants:   Cardiology  Vascular surgery  Palliative care  Procedures:   Revision of right above-knee amputation on January 18 by Dr. Donzetta Matters  prbc transfusion x1  Antimicrobials:    Vancomycin augmentin    Objective: Vitals:   01/31/20 0349 01/31/20 0812 01/31/20 0830 01/31/20 0836  BP: 109/66 116/72    Pulse: 80 97 (!) 117   Resp: 15 16 16    Temp: 97.8 F (36.6 C) (!) 97.5 F (36.4 C)    TempSrc: Oral Oral    SpO2: 98% 100% 100% 95%  Weight:      Height:        Intake/Output Summary (Last 24 hours) at 01/31/2020 0915 Last data filed at 01/31/2020 0700 Gross per 24 hour  Intake -  Output 600 ml  Net -600 ml   Filed Weights   01/28/20 0859 01/29/20 0500 01/30/20 0446  Weight: 52.3 kg 52.2 kg 52.6 kg    Examination:  General exam: Frail, hard of hearing calm, NAD Respiratory system: clear, no wheezing, no rales, no rhonchi. Respiratory effort normal. Cardiovascular system: S1 & S2 heard, RRR. No JVD, no murmur, No pedal edema. Gastrointestinal system: Abdomen is nondistended, soft and nontender.  Normal bowel sounds heard. Central nervous system: Alert and oriented. No focal neurological deficits. Extremities: Right AKA, dressing intact, left foot less edema , less erythema, no open wound, Skin: No rashes, lesions or ulcers Psychiatry: Judgement and insight appear normal. Mood & affect appropriate.       Data Reviewed: I have personally reviewed following labs and imaging studies  CBC: Recent Labs  Lab 01/27/20 0313 01/28/20 0239 01/29/20 0124  01/30/20 0223 01/31/20 0225  WBC 7.4 12.2* 12.2* 10.6* 6.0  NEUTROABS 5.9 10.4* 10.1* 9.2* 5.6  HGB 9.6* 7.8* 7.5* 7.0* 6.6*  HCT 28.5* 23.3* 21.9* 20.1* 19.1*  MCV 95.6 96.3 96.1 95.3 93.6  PLT 176 163 167 158 940    Basic Metabolic Panel: Recent Labs  Lab 01/26/20 0121 01/27/20 0313 01/28/20 0239 01/29/20 0124 01/30/20 0223 01/31/20 0225  NA 132* 134* 131* 128* 130* 126*  K 4.4 4.4 4.3 4.9 4.1 4.3  CL 96* 99 98 95* 94* 91*  CO2 26 28 24 26 25 27   GLUCOSE 97 95 124* 109* 111* 136*  BUN 25* 20 16 19 21 17   CREATININE 1.19 1.01 0.99 1.07 1.07 0.86  CALCIUM 8.6* 9.0 8.8* 9.0 8.7* 8.6*  MG 1.9 2.1 1.9  --  1.8 2.0    GFR: Estimated Creatinine Clearance: 44.2 mL/min (by C-G formula based on SCr of 0.86 mg/dL).  Liver Function Tests: Recent Labs  Lab 01/25/20 0147 01/30/20 0223  AST 23 24  ALT 33 15  ALKPHOS 55 44  BILITOT 1.0 0.7  PROT 5.2* 4.6*  ALBUMIN 3.2* 2.9*    CBG: No results  for input(s): GLUCAP in the last 168 hours.   Recent Results (from the past 240 hour(s))  SARS CORONAVIRUS 2 (TAT 6-24 HRS) Nasopharyngeal Nasopharyngeal Swab     Status: None   Collection Time: 01/24/20  6:30 AM   Specimen: Nasopharyngeal Swab  Result Value Ref Range Status   SARS Coronavirus 2 NEGATIVE NEGATIVE Final    Comment: (NOTE) SARS-CoV-2 target nucleic acids are NOT DETECTED.  The SARS-CoV-2 RNA is generally detectable in upper and lower respiratory specimens during the acute phase of infection. Negative results do not preclude SARS-CoV-2 infection, do not rule out co-infections with other pathogens, and should not be used as the sole basis for treatment or other patient management decisions. Negative results must be combined with clinical observations, patient history, and epidemiological information. The expected result is Negative.  Fact Sheet for Patients: SugarRoll.be  Fact Sheet for Healthcare  Providers: https://www.woods-mathews.com/  This test is not yet approved or cleared by the [place] FDA and  has been authorized for detection and/or diagnosis of SARS-CoV-2 by FDA under an Emergency Use Authorization (EUA). This EUA will remain  in effect (meaning this test can be used) for the duration of the COVID-19 declaration under Se ction 564(b)(1) of the Act, 21 U.S.C. section 360bbb-3(b)(1), unless the authorization is terminated or revoked sooner.  Performed at [facility], [place] [address], [place], [place] [address]   Culture, blood (routine x 2)     Status: None   Collection Time: 01/24/20  3:46 PM   Specimen: BLOOD  Result Value Ref Range Status   Specimen Description BLOOD LEFT ANTECUBITAL  Final   Special Requests   Final    BOTTLES DRAWN AEROBIC AND ANAEROBIC Blood Culture adequate volume   Culture   Final    NO GROWTH 5 DAYS Performed at [facility], [place] [address] [place], [place] [address]    Report Status 01/29/2020 FINAL  Final  Culture, blood (routine x 2)     Status: None   Collection Time: 01/24/20  3:48 PM   Specimen: BLOOD  Result Value Ref Range Status   Specimen Description BLOOD RIGHT ANTECUBITAL  Final   Special Requests   Final    BOTTLES DRAWN AEROBIC AND ANAEROBIC Blood Culture results may not be optimal due to an inadequate volume of blood received in culture bottles   Culture   Final    NO GROWTH 5 DAYS Performed at [facility], [place] [address] [place], [place] [address]    Report Status 01/29/2020 FINAL  Final  Surgical PCR screen     Status: None   Collection Time: 01/27/20  6:03 AM   Specimen: Nasal Mucosa; Nasal Swab  Result Value Ref Range Status   MRSA, PCR NEGATIVE NEGATIVE Final   Staphylococcus aureus NEGATIVE NEGATIVE Final    Comment: (NOTE) The Xpert SA Assay (FDA approved for NASAL specimens in patients 82 years of age and older), is one component of a comprehensive surveillance  program. It is not intended to diagnose infection nor to guide or monitor treatment. Performed at [facility], [place] [address], [place], [place] [address]   Expectorated sputum assessment w rflx to resp cult     Status: None   Collection Time: 01/30/20  4:13 PM   Specimen: Expectorated Sputum  Result Value Ref Range Status   Specimen Description EXPECTORATED SPUTUM  Final   Special Requests NONE  Final   Sputum evaluation   Final  Sputum specimen not acceptable for testing.  Please recollect.   Gram Stain Report Called to,Read Back By and Verified With: RN Dion Saucier [phone] MLM Performed at [facility], [place] [address], [place], [place] [address]    Report Status 01/31/2020 FINAL  Final         Radiology Studies: DG CHEST PORT 1 VIEW  Result Date: 01/30/2020 CLINICAL DATA:  Cough EXAM: PORTABLE CHEST 1 VIEW COMPARISON:  August 18, 2019 FINDINGS: Lungs are hyperexpanded. There is scarring in the left base. There is no edema or airspace opacity. The heart size and pulmonary vascularity are within normal limits. Patient is status post coronary artery bypass grafting. There is aortic atherosclerosis. No adenopathy. Bones are osteoporotic. IMPRESSION: Lungs hyperexpanded. Mild scarring left base. No edema or airspace opacity. Heart size normal. Status post coronary artery bypass grafting. Aortic Atherosclerosis (ICD10-I70.0). Electronically Signed   By: Lowella Grip III M.D.   On: 01/30/2020 13:43        Scheduled Meds: . amoxicillin-clavulanate  1 tablet Oral Q12H  . aspirin EC  81 mg Oral Daily  . atorvastatin  40 mg Oral QHS  . budesonide-formoterol  2 puff Inhalation BID  . Chlorhexidine Gluconate Cloth  6 each Topical Daily  . cholecalciferol  2,000 Units Oral Q lunch  . collagenase   Topical Daily  . feeding supplement  1 Container Oral TID BM  . finasteride  5 mg Oral Q lunch  . fluticasone  2 spray Each Nare Daily  . guaiFENesin  600 mg Oral BID  .  levothyroxine  50 mcg Oral Daily  . metoprolol succinate  12.5 mg Oral Daily  . mirabegron ER  25 mg Oral Daily  . multivitamin with minerals  1 tablet Oral Daily  . mupirocin ointment  1 application Nasal BID  . pantoprazole  40 mg Oral Daily  . polyethylene glycol  17 g Oral BID  . predniSONE  20 mg Oral Daily  . saccharomyces boulardii  250 mg Oral BID  . senna-docusate  1 tablet Oral QHS  . spironolactone  25 mg Oral Daily  . Tiotropium Bromide Monohydrate  2 puff Inhalation Daily  . vitamin B-12  1,000 mcg Oral Daily   Continuous Infusions:    LOS: 7 days   Time spent: 35 mins Greater than 50% of this time was spent in counseling, explanation of diagnosis, planning of further management, and coordination of care.  I have personally reviewed and interpreted on  01/31/2020 daily labs, tele strips, I reviewed all nursing notes, pharmacy notes, consultant notes,  vitals, pertinent old records  I have discussed plan of care as described above with RN , patient and family on 01/31/2020  Voice Recognition /Dragon dictation system was used to create this note, attempts have been made to correct errors. Please contact the author with questions and/or clarifications.   Florencia Reasons, MD PhD FACP [facility]  Available via Epic secure chat 7am-7pm for nonurgent issues Please page for urgent issues To page the attending provider between 7A-7P or the covering provider during after hours 7P-7A, please log into the web site [email or web address] and access using universal [place] password for that web site. If you do not have the password, please call the hospital operator.    01/31/2020, 9:15 AM

## 2020-01-31 NOTE — Progress Notes (Signed)
@  0445 received call from lab. Critical value. Pt Hgb 6.6. Paged [facility]. Got order for RBC transfusion. Pt refused to sign BT consent. Pt states he wants to talk to the daytime Provider. Pt was educated on Hgb and importance of BT. Pt still refused

## 2020-01-31 NOTE — Progress Notes (Signed)
Inpatient Rehab Admissions Coordinator Note:   Per PT recommendation, pt was screened for CIR candidacy by Gayland Curry, MS, CCC-SLP.  At this time we are not recommending an Inpatient rehab consult.  Will continue to monitor pt's tolerance of therapies to determine appropriateness for CIR.  Please contact me with questions.    Gayland Curry, [place], [place] Admissions Coordinator [phone] 01/31/20 12:48 PM

## 2020-01-31 NOTE — Progress Notes (Signed)
PT Cancellation Note  Patient Details Name: [name] MRN: [id number] DOB: 1929-07-01   Cancelled Treatment:    Reason Eval/Treat Not Completed: (P) Patient not medically ready (pt receiving blood products and feeling fatigued, will reattempt later in day as schedule permits per PT POC.)   Corrissa Martello M Darreld Hoffer 01/31/2020, 2:12 PM

## 2020-02-01 DIAGNOSIS — L03116 Cellulitis of left lower limb: Secondary | ICD-10-CM | POA: Diagnosis not present

## 2020-02-01 LAB — BASIC METABOLIC PANEL
Anion gap: 7 (ref 5–15)
BUN: 17 mg/dL (ref 8–23)
CO2: 29 mmol/L (ref 22–32)
Calcium: 9.1 mg/dL (ref 8.9–10.3)
Chloride: 93 mmol/L — ABNORMAL LOW (ref 98–111)
Creatinine, Ser: 0.86 mg/dL (ref 0.61–1.24)
GFR, Estimated: >60 mL/min (ref >60)
Glucose, Bld: 117 mg/dL — ABNORMAL HIGH (ref 70–99)
Potassium: 4.6 mmol/L (ref 3.5–5.1)
Sodium: 129 mmol/L — ABNORMAL LOW (ref 135–145)

## 2020-02-01 LAB — OSMOLALITY: Osmolality: 273 mOsm/kg — ABNORMAL LOW (ref 275–295)

## 2020-02-01 LAB — TYPE AND SCREEN
ABO/RH(D): B POS
Antibody Screen: NEGATIVE
Unit division: 0

## 2020-02-01 LAB — BPAM RBC
Blood Product Expiration Date: 202202182359
ISSUE DATE / TIME: 202201221056
Unit Type and Rh: 7300

## 2020-02-01 LAB — CBC
HCT: 24.6 % — ABNORMAL LOW (ref 39.0–52.0)
Hemoglobin: 8.8 g/dL — ABNORMAL LOW (ref 13.0–17.0)
MCH: 32.6 pg (ref 26.0–34.0)
MCHC: 35.8 g/dL (ref 30.0–36.0)
MCV: 91.1 fL (ref 80.0–100.0)
Platelets: 196 10*3/uL (ref 150–400)
RBC: 2.7 MIL/uL — ABNORMAL LOW (ref 4.22–5.81)
RDW: 13.6 % (ref 11.5–15.5)
WBC: 7.8 10*3/uL (ref 4.0–10.5)
nRBC: 0 % (ref 0.0–0.2)

## 2020-02-01 LAB — SODIUM, URINE, RANDOM: Sodium, Ur: <10 mmol/L

## 2020-02-01 LAB — OSMOLALITY, URINE: Osmolality, Ur: 152 mOsm/kg — ABNORMAL LOW (ref 300–900)

## 2020-02-01 MED ORDER — BISACODYL 10 MG RE SUPP: 10.0000 mg | Freq: Every day | RECTAL | Status: DC | PRN

## 2020-02-01 MED ORDER — OXYMETAZOLINE HCL 0.05 % NA SOLN
1.0000 | Freq: Two times a day (BID) | NASAL | Status: AC
  Filled 2020-02-01: qty 30

## 2020-02-01 MED ORDER — FUROSEMIDE 20 MG PO TABS: 30.0000 mg | ORAL_TABLET | Freq: Two times a day (BID) | ORAL | Status: DC

## 2020-02-01 MED ORDER — SENNOSIDES-DOCUSATE SODIUM 8.6-50 MG PO TABS
1.0000 | ORAL_TABLET | Freq: Two times a day (BID) | ORAL | Status: DC
  Administered 2020-02-02 – 2020-02-03 (×3): 1 via ORAL
  Filled 2020-02-01 (×4): qty 1

## 2020-02-01 MED ORDER — FUROSEMIDE 40 MG PO TABS
40.0000 mg | ORAL_TABLET | Freq: Every day | ORAL | Status: DC
  Administered 2020-02-02 – 2020-02-03 (×2): 40 mg via ORAL
  Filled 2020-02-01 (×2): qty 1

## 2020-02-01 MED ORDER — CALCIUM CARBONATE ANTACID 500 MG PO CHEW: 1.0000 | CHEWABLE_TABLET | Freq: Two times a day (BID) | ORAL | Status: DC | PRN

## 2020-02-01 MED ORDER — FUROSEMIDE 20 MG PO TABS
20.0000 mg | ORAL_TABLET | Freq: Every day | ORAL | Status: DC
  Administered 2020-02-01: 20 mg via ORAL
  Filled 2020-02-01: qty 1

## 2020-02-01 MED ORDER — LEVALBUTEROL HCL 1.25 MG/0.5ML IN NEBU
1.2500 mg | INHALATION_SOLUTION | Freq: Four times a day (QID) | RESPIRATORY_TRACT | Status: DC | PRN
  Administered 2020-02-04: 1.25 mg via RESPIRATORY_TRACT
  Filled 2020-02-01 (×2): qty 0.5

## 2020-02-01 MED ORDER — ALUM & MAG HYDROXIDE-SIMETH 200-200-20 MG/5ML PO SUSP
15.0000 mL | ORAL | Status: DC | PRN
  Filled 2020-02-01: qty 30

## 2020-02-01 NOTE — Progress Notes (Signed)
Assisted pt to sit on the recliner today, mod assist x2. Stayed sitting for 3 hrs. 1830 pt c/o indigestion and requesting for Mylanta but refused hospital's generic Mylanta.

## 2020-02-01 NOTE — NC FL2 (Signed)
[place] LEVEL OF CARE SCREENING TOOL     IDENTIFICATION  Patient Name: [name] Birthdate: 06/04/1929 Sex: male Admission Date (Current Location): 01/23/2020  [facility] and [place] Number:  Herbalist and Address:  [facility] [place]. [facility], [place] [address], [place], [place] [address]      Provider Number: [id number]  Attending Physician Name and Address:  Florencia Reasons, MD  Relative Name and Phone Number:  Arvin, Abello (Spouse)   [phone] (Mobile)    Current Level of Care: SNF Recommended Level of Care: [place] Prior Approval Number:    Date Approved/Denied:   PASRR Number: [id number]  Discharge Plan: SNF    Current Diagnoses: Patient Active Problem List   Diagnosis Date Noted  . Malnutrition of moderate degree 01/30/2020  . Cellulitis of left foot 01/24/2020  . Cellulitis and abscess of left lower extremity 01/24/2020  . Abnormality of gait 10/20/2019  . Pain of left heel 09/01/2019  . Labile blood pressure   . Anxiety state   . Hypothyroidism   . Right hip pain   . Sacral decubitus ulcer   . Post-operative pain   . Chronic systolic congestive heart failure ([place])   . Urinary retention due to benign prostatic hyperplasia 08/20/2019  . SOB (shortness of breath)   . Chronic systolic CHF (congestive heart failure) ([place])   . Hypoalbuminemia due to protein-calorie malnutrition ([place])   . Transaminitis   . Acute blood loss anemia   . Hyponatremia   . Postoperative pain   . Supplemental oxygen dependent   . S/P AKA (above knee amputation) ([place]) 08/14/2019  . Peripheral arterial disease ([place]) 08/11/2019  . Abnormal urinalysis 08/11/2019  . Critical lower limb ischemia ([place]) 08/11/2019  . DNR (do not resuscitate)   . Voiding difficulty   . Palliative care by specialist   . Pressure injury of skin 06/29/2019  . Cellulitis of left lower extremity 06/28/2019  . Cellulitis of right lower extremity 06/27/2019  .  AKI (acute kidney injury) ([place]) 06/27/2019  . Headache 10/16/2018  . Severe major depression without psychotic features ([place]) 09/22/2017  . Generalized anxiety disorder 09/22/2017  . Preoperative cardiovascular examination 05/10/2017  . Weakness generalized 04/04/2017  . Chest pain 03/24/2017  . AV block, [place] 1 03/24/2017  . CAD (coronary artery disease) 03/24/2017  . Chronic diastolic CHF (congestive heart failure) ([place]) 03/24/2017  . COPD (chronic obstructive pulmonary disease) ([place]) 03/24/2017  . NSTEMI (non-ST elevated myocardial infarction) ([place])   . Elevated troponin 03/09/2017  . Acute on chronic diastolic heart failure ([place])   . Tachycardia 03/08/2017  . Influenza A 02/04/2017  . Cor pulmonale, chronic (HCC)/ clinical dx  09/13/2016  . Orthostatic hypotension 09/26/2015  . Angina at rest [facility]) 09/24/2015  . BPH (benign prostatic hyperplasia) 09/13/2015  . GERD (gastroesophageal reflux disease) 09/13/2015  . Anxiety 09/13/2015  . Sinusitis, chronic 09/09/2015  . DOE (dyspnea on exertion) 04/15/2015  . Acute heart failure ([place]) 03/18/2015  . Right calf pain 06/18/2014  . Coronary artery disease involving coronary bypass graft of native heart with angina pectoris ([place])   . Peripheral vascular disease ([place])   . Essential hypertension   . Obstructive sleep apnea   . Rhinitis, chronic 01/29/2007  . COPD   GOLD IV/ 02/steroid dep  01/29/2007  . Chronic respiratory failure with hypoxia and hypercapnia ([place]) 01/29/2007  . Hyperlipidemia     Orientation RESPIRATION BLADDER Height & Weight     Self,Time,Situation,Place  Normal Continent Weight: 115 lb 15.4 oz (52.6 kg) Height:  5\' 8"  (172.7 cm)  BEHAVIORAL SYMPTOMS/MOOD NEUROLOGICAL BOWEL NUTRITION STATUS      Continent Diet  AMBULATORY STATUS COMMUNICATION OF NEEDS Skin   Extensive Assist Verbally Normal                       Personal Care Assistance Level of Assistance  Bathing,Feeding,Dressing Bathing  Assistance: Limited assistance Feeding assistance: Independent Dressing Assistance: Limited assistance     Functional Limitations Info  Sight,Hearing,Speech Sight Info: Adequate Hearing Info: Adequate Speech Info: Adequate    SPECIAL CARE FACTORS FREQUENCY  PT (By licensed PT),OT (By licensed OT)     PT Frequency: 5x per week OT Frequency: 5x per week            Contractures Contractures Info: Present    Additional Factors Info  Code Status Code Status Info: full code             Current Medications (02/01/2020):  This is the current hospital active medication list Current Facility-Administered Medications  Medication Dose Route Frequency Provider Last Rate Last Admin  . acetaminophen (TYLENOL) tablet 650 mg  650 mg Oral Q6H PRN Aline August, MD   650 mg at 02/01/20 1020  . ALPRAZolam Duanne Moron) tablet 0.25 mg  0.25 mg Oral TID PRN Florencia Reasons, MD   0.25 mg at 01/31/20 2131  . alum & mag hydroxide-simeth (MAALOX/MYLANTA) 200-200-20 MG/5ML suspension 15 mL  15 mL Oral Q4H PRN Florencia Reasons, MD      . amoxicillin-clavulanate (AUGMENTIN) 875-125 MG per tablet 1 tablet  1 tablet Oral Q12H Florencia Reasons, MD   1 tablet at 02/01/20 [phone]  . aspirin EC tablet 81 mg  81 mg Oral Daily Laurence Slate M, PA-C   81 mg at 02/01/20 0940  . atorvastatin (LIPITOR) tablet 40 mg  40 mg Oral QHS Freada Bergeron, MD   40 mg at 01/31/20 2131  . budesonide-formoterol (SYMBICORT) 160-4.5 MCG/ACT inhaler 2 puff  2 puff Inhalation BID Alvira Philips, RPH   2 puff at 02/01/20 0900  . calcium carbonate (TUMS - dosed in mg elemental calcium) chewable tablet 200 mg of elemental calcium  1 tablet Oral BID PRN Florencia Reasons, MD      . Chlorhexidine Gluconate Cloth 2 % PADS 6 each  6 each Topical Daily Ulyses Amor, PA-C   6 each at 01/30/20 2500  . cholecalciferol (VITAMIN D3) tablet 2,000 Units  2,000 Units Oral Q lunch Laurence Slate M, PA-C   2,000 Units at 01/31/20 1215  . collagenase (SANTYL) ointment    Topical Daily Ulyses Amor, [place]   Given at 02/01/20 3704  . feeding supplement (BOOST / RESOURCE BREEZE) liquid 1 Container  1 Container Oral TID BM Florencia Reasons, MD   1 Container at 01/31/20 1431  . finasteride (PROSCAR) tablet 5 mg  5 mg Oral Q lunch Laurence Slate M, PA-C   5 mg at 01/31/20 1214  . fluticasone (FLONASE) 50 MCG/ACT nasal spray 2 spray  2 spray Each Nare Daily Ulyses Amor, PA-C   2 spray at 01/28/20 1043  . guaiFENesin (MUCINEX) 12 hr tablet 600 mg  600 mg Oral BID Florencia Reasons, MD   600 mg at 02/01/20 0943  . HYDROcodone-acetaminophen (NORCO/VICODIN) 5-325 MG per tablet 1 tablet  1 tablet Oral Q4H PRN Setzer, [address], PA-C      . levothyroxine (SYNTHROID) tablet 50 mcg  50 mcg Oral Daily Laurence Slate M, [place]   50 mcg at 02/01/20 [id number]  . metoprolol succinate (TOPROL-XL) 24 hr tablet 12.5 mg  12.5 mg Oral Daily Laurence Slate M, PA-C   12.5 mg at 02/01/20 0940  . metoprolol tartrate (LOPRESSOR) injection 2.5 mg  2.5 mg Intravenous Q4H PRN Laurence Slate M, PA-C   2.5 mg at 01/25/20 1917  . mirabegron ER (MYRBETRIQ) tablet 25 mg  25 mg Oral Daily Laurence Slate M, PA-C   25 mg at 02/01/20 [id number]  . multivitamin with minerals tablet 1 tablet  1 tablet Oral Daily Ulyses Amor, PA-C   1 tablet at 01/31/20 [id number]  . nitroGLYCERIN (NITROSTAT) SL tablet 0.4 mg  0.4 mg Sublingual Q5 min PRN Ulyses Amor, PA-C   0.4 mg at 01/26/20 1358  . pantoprazole (PROTONIX) EC tablet 40 mg  40 mg Oral Daily Laurence Slate M, PA-C   40 mg at 02/01/20 [id number]  . polyethylene glycol (MIRALAX / GLYCOLAX) packet 17 g  17 g Oral BID Florencia Reasons, MD   17 g at 02/01/20 0939  . polyvinyl alcohol (LIQUIFILM TEARS) 1.4 % ophthalmic solution 1 drop  1 drop Both Eyes PRN Laurence Slate M, PA-C      . predniSONE (DELTASONE) tablet 10 mg  10 mg Oral Daily Florencia Reasons, MD   10 mg at 02/01/20 0940  . saccharomyces boulardii (FLORASTOR) capsule 250 mg  250 mg Oral BID Florencia Reasons, MD   250 mg at 01/31/20 2131  . senna-docusate  (Senokot-S) tablet 1 tablet  1 tablet Oral QHS Florencia Reasons, MD      . spironolactone (ALDACTONE) tablet 25 mg  25 mg Oral Daily Florencia Reasons, MD   25 mg at 02/01/20 0940  . Tiotropium Bromide Monohydrate AERS 2 puff  2 puff Inhalation Daily Alvira Philips, [place]   2 puff at 02/01/20 0900  . vitamin B-12 (CYANOCOBALAMIN) tablet 1,000 mcg  1,000 mcg Oral Daily Laurence Slate M, PA-C   1,000 mcg at 02/01/20 [id number]     Discharge Medications: Please see discharge summary for a list of discharge medications.  Relevant Imaging Results:  Relevant Lab Results:   Additional Information Soc Sec# [id number]  [facility] [place], [place]

## 2020-02-01 NOTE — Plan of Care (Signed)

## 2020-02-01 NOTE — Progress Notes (Signed)
PROGRESS NOTE    EDI GORNIAK  QMV:[id number] DOB: 14-Mar-1929 DOA: 01/23/2020 PCP: [name], MD    Chief Complaint  Patient presents with  . Wound Check    Brief Narrative:  87 year old male with history of hypertension, hyperlipidemia, COPD, chronic hypoxic respiratory failure requiring 2 L/min nasal cannula as well as trilogy, chronic diastolic heart failure, status post right AKA presented with worsening left foot pain and redness not improving with outpatient doxycycline. On presentation, he had left foot redness going towards the ankle and was started on IV antibiotics.  Subjective:  No wheezing this morning, still some congested cough denies chest pain, no sob, on 2liter oxygen which is baseline Report nasal congestion, flonase is not helping, wants afrin  Still No bm  Reports feeling depressed, declined antidepressant   Hard of hearing, wife at bedside   Assessment & Plan:   Principal Problem:   Cellulitis of left foot Active Problems:   Hyperlipidemia   Obstructive sleep apnea   Chronic respiratory failure with hypoxia and hypercapnia (HCC)   Peripheral vascular disease (HCC)   Sacral decubitus ulcer   Cellulitis and abscess of left lower extremity   Malnutrition of moderate degree   Right AKA stump wound/peripheral vascular disease -MRI did not show osteomyelitis of the right AKA site -Status post revision surgery on January 18, wound care  per vascular surgery -per vascular surgery does not need abx for right leg -will follow vascular surgery recommendation  Left foot cellulitis -Blood culture no growth, MRSA screening negative, SARS-CoV-2 screening negative -Left foot MRI did not show sign of osteomyelitis -Failed outpatient doxycycline, history of staph lugdunensis right foot sensitive to vanc/doxy/bactrim, he has multiple abx allergies/side effects, has been on Vanco since admission ,  Finished total 7 days  vanc treatment  -left foot look  worse on 1/20, - esr/crp slightly elevated, added augmentin,  left foot better now, wbc normalized on 1/22, plan to total of 7 days augmentin -Case discussed with vascular surgery regarding left foot, Dr Donzetta Matters states patient declined amputation to left foot ( wife confirmed ) -continue conservative management for now   Chest pain with mild positive troponin in a patient with history of CAD status post CABG -Patient had chest pain on 01/26/2020 with only minimally elevated troponin which did not significantly trend upwards.   -Echocardiogram ordered by cardiology which is unremarkable -Currently on aspirin, Lipitor, Lopressor -Currently chest pain-free, cardiology  recommend medical management, cardiology input appreciated  Chronic diastolic CHF Home medication Lasix and spironolactone held due to hypotension and AKI -AKI has resolved, resume diuretics  NSVT: Keep on tele, continue betablocker, keep K >4, mag>2  Hyponatremia Appear chronic ,  may be from heart failure, resume Lasix and spironolactone, monitor BMP  AKI BUN 33/creatinine 1. 4 6 on presentation -AKI resolved  Hypertension Presented with hypotension, now blood pressure is improving, resume home blood pressure medication and diuretics, monitor blood pressure  COPD, home O2 dependent  (2liters),  on chronic prednisone 10mg  daily started coughing and wheezing on 1/21, cxr no acute findings, received one dose of solumedrol on 1/21, wheezing resolved , received  prednisone 20mg  on 1/22, now back to base line prednisone, continue mucinex, schedule nebs,  Incentive spirometer  Nasal congestion Continue flonase, added afrin bid x3 days per patient's request   Hypothyroidism Continue Synthroid  OSA on CPAP nightly  Indwelling foley, last changed on 1/7, he follows Dr Junious Silk He wants foley changed on 1/20 due to leakage, seems  better after RN reinflate the balloon, continue to monitor he wants brand name myrbetriq from home  ,pharmacy verified it Had hematuria on 1/21, urine clear today,  Normocytic anemia Like from blood loss from right stump and hematuria, monitor cbc transfuse if hgb less than 7 prbcx1 on 1/22 Hgb 8.8 today, monitor   Constipation Increase miralax, increase sennokot , prn suppository     DVT prophylaxis: Place TED hose Start: 01/24/20 1314   Code Status: Full Family Communication: Wife at bedside daily Disposition:   Status is: Inpatient   Dispo: The patient is from: Home              Anticipated d/c is to: CIR versus skilled nursing facility pending PT eval              Anticipated d/c date is: patient desires CIR, they are reluctant snf placement                Consultants:   Cardiology  Vascular surgery  Palliative care  Procedures:   Revision of right above-knee amputation on January 18 by Dr. Donzetta Matters  prbc transfusion x1  Antimicrobials:    Vancomycin augmentin    Objective: Vitals:   01/31/20 2014 02/01/20 0348 02/01/20 0940 02/01/20 1426  BP: 123/76 132/77 135/76 (!) 114/53  Pulse: 90 84 90 79  Resp: 18 16 17 18   Temp: 97.9 F (36.6 C) 97.6 F (36.4 C) 98.4 F (36.9 C) 97.7 F (36.5 C)  TempSrc: Oral Oral Oral Oral  SpO2: 98% 97% 98% 100%  Weight:      Height:        Intake/Output Summary (Last 24 hours) at 02/01/2020 1505 Last data filed at 02/01/2020 0500 Gross per 24 hour  Intake 120 ml  Output 1550 ml  Net -1430 ml   Filed Weights   01/28/20 0859 01/29/20 0500 01/30/20 0446  Weight: 52.3 kg 52.2 kg 52.6 kg    Examination:  General exam: Frail, hard of hearing calm, NAD Respiratory system: clear, no wheezing, no rales, no rhonchi. Respiratory effort normal. Cardiovascular system: S1 & S2 heard, RRR. No JVD, no murmur, No pedal edema. Gastrointestinal system: Abdomen is nondistended, soft and nontender.  Normal bowel sounds heard. Central nervous system: Alert and oriented. No focal neurological deficits. Extremities: Right AKA,  dressing intact, left foot less edema , less erythema, no open wound, Skin: No rashes, lesions or ulcers Psychiatry: Judgement and insight appear normal. Mood & affect appropriate.       Data Reviewed: I have personally reviewed following labs and imaging studies  CBC: Recent Labs  Lab 01/27/20 0313 01/28/20 0239 01/29/20 0124 01/30/20 0223 01/31/20 0225 02/01/20 0238  WBC 7.4 12.2* 12.2* 10.6* 6.0 7.8  NEUTROABS 5.9 10.4* 10.1* 9.2* 5.6  --   HGB 9.6* 7.8* 7.5* 7.0* 6.6* 8.8*  HCT 28.5* 23.3* 21.9* 20.1* 19.1* 24.6*  MCV 95.6 96.3 96.1 95.3 93.6 91.1  PLT 176 163 167 158 180 825    Basic Metabolic Panel: Recent Labs  Lab 01/26/20 0121 01/27/20 0313 01/28/20 0239 01/29/20 0124 01/30/20 0223 01/31/20 0225 02/01/20 0238  NA 132* 134* 131* 128* 130* 126* 129*  K 4.4 4.4 4.3 4.9 4.1 4.3 4.6  CL 96* 99 98 95* 94* 91* 93*  CO2 26 28 24 26 25 27 29   GLUCOSE 97 95 124* 109* 111* 136* 117*  BUN 25* 20 16 19 21 17 17   CREATININE 1.19 1.01 0.99 1.07 1.07 0.86 0.86  CALCIUM 8.6*  9.0 8.8* 9.0 8.7* 8.6* 9.1  MG 1.9 2.1 1.9  --  1.8 2.0  --     GFR: Estimated Creatinine Clearance: 44.2 mL/min (by C-G formula based on SCr of 0.86 mg/dL).  Liver Function Tests: Recent Labs  Lab 01/30/20 0223  AST 24  ALT 15  ALKPHOS 44  BILITOT 0.7  PROT 4.6*  ALBUMIN 2.9*    CBG: No results for input(s): GLUCAP in the last 168 hours.   Recent Results (from the past 240 hour(s))  SARS CORONAVIRUS 2 (TAT 6-24 HRS) Nasopharyngeal Nasopharyngeal Swab     Status: None   Collection Time: 01/24/20  6:30 AM   Specimen: Nasopharyngeal Swab  Result Value Ref Range Status   SARS Coronavirus 2 NEGATIVE NEGATIVE Final    Comment: (NOTE) SARS-CoV-2 target nucleic acids are NOT DETECTED.  The SARS-CoV-2 RNA is generally detectable in upper and lower respiratory specimens during the acute phase of infection. Negative results do not preclude SARS-CoV-2 infection, do not rule  out co-infections with other pathogens, and should not be used as the sole basis for treatment or other patient management decisions. Negative results must be combined with clinical observations, patient history, and epidemiological information. The expected result is Negative.  Fact Sheet for Patients: SugarRoll.be  Fact Sheet for Healthcare Providers: https://www.woods-mathews.com/  This test is not yet approved or cleared by the [place] FDA and  has been authorized for detection and/or diagnosis of SARS-CoV-2 by FDA under an Emergency Use Authorization (EUA). This EUA will remain  in effect (meaning this test can be used) for the duration of the COVID-19 declaration under Se ction 564(b)(1) of the Act, 21 U.S.C. section 360bbb-3(b)(1), unless the authorization is terminated or revoked sooner.  Performed at [facility], [place] [address], [place], [place] [address]   Culture, blood (routine x 2)     Status: None   Collection Time: 01/24/20  3:46 PM   Specimen: BLOOD  Result Value Ref Range Status   Specimen Description BLOOD LEFT ANTECUBITAL  Final   Special Requests   Final    BOTTLES DRAWN AEROBIC AND ANAEROBIC Blood Culture adequate volume   Culture   Final    NO GROWTH 5 DAYS Performed at [facility], [place] [address], [place], [place] [address]    Report Status 01/29/2020 FINAL  Final  Culture, blood (routine x 2)     Status: None   Collection Time: 01/24/20  3:48 PM   Specimen: BLOOD  Result Value Ref Range Status   Specimen Description BLOOD RIGHT ANTECUBITAL  Final   Special Requests   Final    BOTTLES DRAWN AEROBIC AND ANAEROBIC Blood Culture results may not be optimal due to an inadequate volume of blood received in culture bottles   Culture   Final    NO GROWTH 5 DAYS Performed at [facility], [place] [address] [place], [place] [address]    Report Status 01/29/2020 FINAL  Final  Surgical PCR  screen     Status: None   Collection Time: 01/27/20  6:03 AM   Specimen: Nasal Mucosa; Nasal Swab  Result Value Ref Range Status   MRSA, PCR NEGATIVE NEGATIVE Final   Staphylococcus aureus NEGATIVE NEGATIVE Final    Comment: (NOTE) The Xpert SA Assay (FDA approved for NASAL specimens in patients 48 years of age and older), is one component of a comprehensive surveillance program. It is not intended to diagnose infection nor to guide or monitor  treatment. Performed at [facility], [place] [address], [place], [place] [address]   Expectorated sputum assessment w rflx to resp cult     Status: None   Collection Time: 01/30/20  4:13 PM   Specimen: Expectorated Sputum  Result Value Ref Range Status   Specimen Description EXPECTORATED SPUTUM  Final   Special Requests NONE  Final   Sputum evaluation   Final    Sputum specimen not acceptable for testing.  Please recollect.   Gram Stain Report Called to,Read Back By and Verified With: RN Dion Saucier [phone] MLM Performed at [facility], [place] [address] [place], [place] [address]    Report Status 01/31/2020 FINAL  Final         Radiology Studies: No results found.      Scheduled Meds: . amoxicillin-clavulanate  1 tablet Oral Q12H  . aspirin EC  81 mg Oral Daily  . atorvastatin  40 mg Oral QHS  . budesonide-formoterol  2 puff Inhalation BID  . Chlorhexidine Gluconate Cloth  6 each Topical Daily  . cholecalciferol  2,000 Units Oral Q lunch  . collagenase   Topical Daily  . feeding supplement  1 Container Oral TID BM  . finasteride  5 mg Oral Q lunch  . fluticasone  2 spray Each Nare Daily  . furosemide  20 mg Oral q1600  . [START ON 02/02/2020] furosemide  40 mg Oral Daily  . guaiFENesin  600 mg Oral BID  . levothyroxine  50 mcg Oral Daily  . metoprolol succinate  12.5 mg Oral Daily  . mirabegron ER  25 mg Oral Daily  . multivitamin with minerals  1 tablet Oral Daily  . oxymetazoline  1 spray Each Nare BID  .  pantoprazole  40 mg Oral Daily  . polyethylene glycol  17 g Oral BID  . predniSONE  10 mg Oral Daily  . saccharomyces boulardii  250 mg Oral BID  . senna-docusate  1 tablet Oral BID  . spironolactone  25 mg Oral Daily  . Tiotropium Bromide Monohydrate  2 puff Inhalation Daily  . vitamin B-12  1,000 mcg Oral Daily   Continuous Infusions:    LOS: 8 days   Time spent: 25 mins Greater than 50% of this time was spent in counseling, explanation of diagnosis, planning of further management, and coordination of care.  I have personally reviewed and interpreted on  02/01/2020 daily labs, tele strips, I reviewed all nursing notes, pharmacy notes, consultant notes,  vitals, pertinent old records  I have discussed plan of care as described above with RN , patient and family on 02/01/2020  Voice Recognition /Dragon dictation system was used to create this note, attempts have been made to correct errors. Please contact the author with questions and/or clarifications.   Florencia Reasons, MD PhD FACP [facility]  Available via Epic secure chat 7am-7pm for nonurgent issues Please page for urgent issues To page the attending provider between 7A-7P or the covering provider during after hours 7P-7A, please log into the web site [email or web address] and access using universal [place] password for that web site. If you do not have the password, please call the hospital operator.    02/01/2020, 3:05 PM

## 2020-02-01 NOTE — TOC Initial Note (Signed)
Transition of Care [facility]) - Initial/Assessment Note    Patient Details  Name: [name] MRN: [id number] Date of Birth: 1929-02-20  Transition of Care [facility]) CM/SW Contact:    Elliot Gurney [place], [place] Phone Number: [phone] 02/01/2020, 11:30 AM  Clinical Narrative:                 Patient is a 87 year old male who presented with Left foot cellulitis with failed outpatient antibiotics as well as Rt AKa stump wound with bone exposed. Patient was screened for CIR and at this time we  Inpatient rehab not recommended.  CIR to continue to monitor to determine appropriateness for CIR. This social worker spoke to patient's spouse who would like patient to be re-evaluated however did agree to SNF workup as back up. SNF work up completed  Expected Discharge Plan: [place] Barriers to Discharge: Continued Medical Work up,Other (comment) (CIR continues to monitor)   Patient Goals and CMS Choice     Choice offered to / list presented to : Spouse  Expected Discharge Plan and Services Expected Discharge Plan: [place]       Living arrangements for the past 2 months: Single Family Home                                      Prior Living Arrangements/Services Living arrangements for the past 2 months: Single Family Home Lives with:: Spouse Patient language and need for interpreter reviewed:: Yes Do you feel safe going back to the place where you live?: No   Spouse feels CIR would be beneficial to patient  Need for Family Participation in Patient Care: Yes (Comment) Care giver support system in place?: Yes (comment)   Criminal Activity/Legal Involvement Pertinent to Current Situation/Hospitalization: No - Comment as needed  Activities of Daily Living Home Assistive Devices/Equipment: Wheelchair (triloghy) ADL Screening (condition at time of admission) Patient's cognitive ability adequate to safely complete daily activities?: Yes Is the patient deaf or have  difficulty hearing?: Yes Does the patient have difficulty seeing, even when wearing glasses/contacts?: Yes Does the patient have difficulty concentrating, remembering, or making decisions?: No Patient able to express need for assistance with ADLs?: Yes Does the patient have difficulty dressing or bathing?: Yes Independently performs ADLs?: No Does the patient have difficulty walking or climbing stairs?: Yes Weakness of Legs: Both Weakness of Arms/Hands: None  Permission Sought/Granted            Permission granted to share info w Relationship: spouse     Emotional Assessment       Orientation: : Oriented to Self,Oriented to Place,Oriented to  Time,Oriented to Situation Alcohol / Substance Use: Not Applicable Psych Involvement: No (comment)  Admission diagnosis:  Hyponatremia [E87.1] Acute kidney injury (nontraumatic) (HCC) [N17.9] Cellulitis of left foot [L03.116] Normochromic normocytic anemia [D64.9] Cellulitis and abscess of left lower extremity [L03.116, L02.416] Ulcer of right thigh, unspecified ulcer stage ([place]) [L97.119] Patient Active Problem List   Diagnosis Date Noted  . Malnutrition of moderate degree 01/30/2020  . Cellulitis of left foot 01/24/2020  . Cellulitis and abscess of left lower extremity 01/24/2020  . Abnormality of gait 10/20/2019  . Pain of left heel 09/01/2019  . Labile blood pressure   . Anxiety state   . Hypothyroidism   . Right hip pain   . Sacral decubitus ulcer   . Post-operative pain   .  Chronic systolic congestive heart failure ([place])   . Urinary retention due to benign prostatic hyperplasia 08/20/2019  . SOB (shortness of breath)   . Chronic systolic CHF (congestive heart failure) ([place])   . Hypoalbuminemia due to protein-calorie malnutrition ([place])   . Transaminitis   . Acute blood loss anemia   . Hyponatremia   . Postoperative pain   . Supplemental oxygen dependent   . S/P AKA (above knee amputation) ([place]) 08/14/2019  .  Peripheral arterial disease ([place]) 08/11/2019  . Abnormal urinalysis 08/11/2019  . Critical lower limb ischemia ([place]) 08/11/2019  . DNR (do not resuscitate)   . Voiding difficulty   . Palliative care by specialist   . Pressure injury of skin 06/29/2019  . Cellulitis of left lower extremity 06/28/2019  . Cellulitis of right lower extremity 06/27/2019  . AKI (acute kidney injury) ([place]) 06/27/2019  . Headache 10/16/2018  . Severe major depression without psychotic features ([place]) 09/22/2017  . Generalized anxiety disorder 09/22/2017  . Preoperative cardiovascular examination 05/10/2017  . Weakness generalized 04/04/2017  . Chest pain 03/24/2017  . AV block, [place] 1 03/24/2017  . CAD (coronary artery disease) 03/24/2017  . Chronic diastolic CHF (congestive heart failure) ([place]) 03/24/2017  . COPD (chronic obstructive pulmonary disease) ([place]) 03/24/2017  . NSTEMI (non-ST elevated myocardial infarction) ([place])   . Elevated troponin 03/09/2017  . Acute on chronic diastolic heart failure ([place])   . Tachycardia 03/08/2017  . Influenza A 02/04/2017  . Cor pulmonale, chronic (HCC)/ clinical dx  09/13/2016  . Orthostatic hypotension 09/26/2015  . Angina at rest [facility]) 09/24/2015  . BPH (benign prostatic hyperplasia) 09/13/2015  . GERD (gastroesophageal reflux disease) 09/13/2015  . Anxiety 09/13/2015  . Sinusitis, chronic 09/09/2015  . DOE (dyspnea on exertion) 04/15/2015  . Acute heart failure ([place]) 03/18/2015  . Right calf pain 06/18/2014  . Coronary artery disease involving coronary bypass graft of native heart with angina pectoris ([place])   . Peripheral vascular disease ([place])   . Essential hypertension   . Obstructive sleep apnea   . Rhinitis, chronic 01/29/2007  . COPD   GOLD IV/ 02/steroid dep  01/29/2007  . Chronic respiratory failure with hypoxia and hypercapnia ([place]) 01/29/2007  . Hyperlipidemia    PCP:  [name], MD Pharmacy:   [facility] [place],  [address] AT [address] [place] [address] Phone: [phone] Fax: [phone]     Social Determinants of Health (SDOH) Interventions    Readmission Risk Interventions No flowsheet data found.

## 2020-02-02 ENCOUNTER — Telehealth: Payer: Self-pay

## 2020-02-02 ENCOUNTER — Encounter (HOSPITAL_COMMUNITY): Payer: Self-pay | Admitting: Family Medicine

## 2020-02-02 ENCOUNTER — Ambulatory Visit: Payer: Self-pay | Admitting: *Deleted

## 2020-02-02 ENCOUNTER — Other Ambulatory Visit: Payer: Self-pay

## 2020-02-02 DIAGNOSIS — I483 Typical atrial flutter: Secondary | ICD-10-CM | POA: Diagnosis not present

## 2020-02-02 DIAGNOSIS — I4891 Unspecified atrial fibrillation: Secondary | ICD-10-CM | POA: Diagnosis not present

## 2020-02-02 DIAGNOSIS — L03116 Cellulitis of left lower limb: Secondary | ICD-10-CM | POA: Diagnosis not present

## 2020-02-02 DIAGNOSIS — I5032 Chronic diastolic (congestive) heart failure: Secondary | ICD-10-CM | POA: Diagnosis not present

## 2020-02-02 DIAGNOSIS — I25708 Atherosclerosis of coronary artery bypass graft(s), unspecified, with other forms of angina pectoris: Secondary | ICD-10-CM

## 2020-02-02 LAB — HEPARIN LEVEL (UNFRACTIONATED): Heparin Unfractionated: 0.13 IU/mL — ABNORMAL LOW (ref 0.30–0.70)

## 2020-02-02 LAB — SEDIMENTATION RATE: Sed Rate: 6 mm/hr (ref 0–16)

## 2020-02-02 LAB — C-REACTIVE PROTEIN: CRP: 0.8 mg/dL (ref <1.0)

## 2020-02-02 LAB — BASIC METABOLIC PANEL
Anion gap: 8 (ref 5–15)
BUN: 16 mg/dL (ref 8–23)
CO2: 29 mmol/L (ref 22–32)
Calcium: 9.2 mg/dL (ref 8.9–10.3)
Chloride: 91 mmol/L — ABNORMAL LOW (ref 98–111)
Creatinine, Ser: 0.9 mg/dL (ref 0.61–1.24)
GFR, Estimated: >60 mL/min (ref >60)
Glucose, Bld: 93 mg/dL (ref 70–99)
Potassium: 4.2 mmol/L (ref 3.5–5.1)
Sodium: 128 mmol/L — ABNORMAL LOW (ref 135–145)

## 2020-02-02 LAB — CBC
HCT: 28.1 % — ABNORMAL LOW (ref 39.0–52.0)
Hemoglobin: 9.3 g/dL — ABNORMAL LOW (ref 13.0–17.0)
MCH: 31.1 pg (ref 26.0–34.0)
MCHC: 33.1 g/dL (ref 30.0–36.0)
MCV: 94 fL (ref 80.0–100.0)
Platelets: 215 10*3/uL (ref 150–400)
RBC: 2.99 MIL/uL — ABNORMAL LOW (ref 4.22–5.81)
RDW: 13.7 % (ref 11.5–15.5)
WBC: 7.8 10*3/uL (ref 4.0–10.5)
nRBC: 0 % (ref 0.0–0.2)

## 2020-02-02 MED ORDER — METOPROLOL TARTRATE 5 MG/5ML IV SOLN
5.0000 mg | Freq: Once | INTRAVENOUS | Status: AC
  Administered 2020-02-02: 5 mg via INTRAVENOUS
  Filled 2020-02-02: qty 5

## 2020-02-02 MED ORDER — METOPROLOL SUCCINATE ER 25 MG PO TB24
25.0000 mg | ORAL_TABLET | Freq: Every day | ORAL | Status: DC
  Administered 2020-02-03 – 2020-02-05 (×3): 25 mg via ORAL
  Filled 2020-02-02: qty 1

## 2020-02-02 MED ORDER — HEPARIN (PORCINE) 25000 UT/250ML-% IV SOLN
850.0000 [IU]/h | INTRAVENOUS | Status: DC
  Administered 2020-02-02: 700 [IU]/h via INTRAVENOUS
  Filled 2020-02-02: qty 250

## 2020-02-02 MED ORDER — METOPROLOL TARTRATE 5 MG/5ML IV SOLN
5.0000 mg | Freq: Once | INTRAVENOUS | Status: AC
  Administered 2020-02-02: 5 mg via INTRAVENOUS

## 2020-02-02 NOTE — Plan of Care (Signed)

## 2020-02-02 NOTE — Telephone Encounter (Signed)
Spoke with Sharyn Lull, RN on 5N, patient did consent to receive blood.

## 2020-02-02 NOTE — Progress Notes (Addendum)
PROGRESS NOTE    [name]  QPR:[id number] DOB: 07-04-1929 DOA: 01/23/2020 PCP: [name], MD   Chief Complaint  Patient presents with  . Wound Check   Brief Narrative: 87 year old male with complex comorbidities with hypertension, hyperlipidemia, COPD/chronic hypoxic respiratory failure on 2 L nasal cannula at the lowest trilogy at home, chronic diastolic CHF, status post right AKA presented with left foot pain and redness that did not improve on doxycycline.  He was found to have left foot redness going towards the ankle and will start on IV antibiotics.  Seen by vascular surgery revision surgery right AKA stump on 1/18, per vascular no further need of antibiotic on the right leg. Patient also treated for left lower cellulitis work up w/ MRSA screen negative blood culture no growth complete 7 days of antibiotic but left foot looked worse ESR/CRP slightly elevated, placed on Augmentin 1/20, and left foot appears better WBC normalized on 1/22.  As per vascular patient has declined amputation of the left foot.  Patient had episode of chest pain with mildly positive troponin cardiology was consulted. Seen by PT OT CIR recommended  Subjective:  Tachy this am with ekg = A flutter RVR Afebrile overnight. Some shortness of breath and midl cough- on 3l [place] at home on 2l Labs with chronic hyponatremia 128, afebrile overnight, CRP normal at 0.8 Wife at bedside Pain c/o palpitation/cp.pain all over but mostly on rt stump.  Assessment & Plan:  Cellulitis of left foot:MRSA screen negative blood culture no growth complete 7 days of antibiotic but left foot looked worse ESR/CRP slightly elevated, placed on Augmentin 1/20, and left foot appears better WBC normalized on 1/22. Pt would not want amputation if needed and wife states "if he needs amputation on left- he will be hospice". Plan for 7 days of Augmentin.  Wbc normalized  7.8k CRP normal at 0.8  Right BKA stump wound/PVD: recent right  AKA, seen by vascular surgery revision surgery right AKA stump on 1/18, per vascular no further need of antibiotic on the right leg  A Flutter w/ RVR, new onset- cards notified, trying metoprolol. monitor in tele. Heparin gtt w/ high chads2vasc score.  Chest pain with mildly positive troponin/history of CAD status post CABG, cards following.  Suspecting tachycardia related angina, continue rate control not a good candidate for invasive coronary evaluation.  EKG with no ST depression.  Chronic diastolic CHF: No signs of fluid overload.  Monitor volume status.  Chronic respiratory failure with hypoxia/hypercapnia/COPD/OSA obstructive sleep apnea: Normally on 2l [place] at home alonG with Trilogy at home chronic prednisone 10 mg and tolerated nasal cannula.  AKI- creat improved. Recent Labs  Lab 01/29/20 0124 01/30/20 0223 01/31/20 0225 02/01/20 0238 02/02/20 0511  BUN _0 CREATININE 1.07 1.07 0.86 0.86 0.90   Hyponatremia: Sodium: 1 28-1 30.  Monitor Recent Labs  Lab 01/29/20 0124 01/30/20 0223 01/31/20 0225 02/01/20 0238 02/02/20 0511  NA 128* 130* 126* 129* 128*   NSVT: Now with a flutter.  Trying beta-blocker.  Hyperlipidemia: Continue atorvastatin  Normocytic anemia patient with acute drop in hemoglobin as low as 6.6 g 01/30/20- Hemoglobin around 8.8-10 gm from 2021, suspect acute blood loss anemia from  #2??, in the setting of anemia of chronic disease -s/p 1 unit PRBC transfusion 1/22. H/H improve, monitor. Recent Labs  Lab 01/29/20 0124 01/30/20 0223 01/31/20 0225 02/01/20 0238 02/02/20 0511  HGB 7.5* 7.0* 6.6* 8.8* 9.3*  HCT 21.9* 20.1* 19.1*  24.6* 28.1*    Constipation: Continue MiraLAX, Senokot and as needed suppository.  Chronic indwelling Foley catheter last changed 1/7 followed by [name] from urology.  Continue his home regimen.  GOC: Full code, palliative care has been consulted.  Overall prognosis guarded, and reminns to be  seen  Nutrition: Diet Order            Diet Heart Room service appropriate? Yes; Fluid consistency: Thin  Diet effective now                 Nutrition Problem: Moderate Malnutrition Etiology: chronic illness (COPD) Signs/Symptoms: percent weight loss,mild fat depletion,moderate fat depletion Percent weight loss: 17.8 % Interventions: Boost Breeze,MVI  Body mass index is 17.63 kg/m.  Pressure Ulcer: Pressure Injury 01/24/20 Coccyx Mid;Lower Stage 2 -  Partial thickness loss of dermis presenting as a shallow open injury with a red, pink wound bed without slough. (Active)  01/24/20 1445  Location: Coccyx  Location Orientation: Mid;Lower  Staging: Stage 2 -  Partial thickness loss of dermis presenting as a shallow open injury with a red, pink wound bed without slough.  Wound Description (Comments):   Present on Admission: Yes    DVT prophylaxis: Place TED hose Start: 01/24/20 1314 Code Status:   Code Status: Full Code  Family Communication: plan of care discussed with patient and his wife at bedside.  Status is: Inpatient Remains inpatient appropriate because:Inpatient level of care appropriate due to severity of illness and For ongoing management of left foot cellulitis, a flutter RVR  Dispo: The patient is from: Home              Anticipated d/c is to: CIR vs SNF vs HHC- Palliative consulted.              Anticipated d/c date is: 3 days              Patient currently is not medically stable to d/c.   Difficult to place patient No  Consultants:see note  Procedures:see note  Culture/Microbiology    Component Value Date/Time   SDES EXPECTORATED SPUTUM 01/30/2020 1613   SPECREQUEST NONE 01/30/2020 1613   CULT  01/24/2020 1548    NO GROWTH 5 DAYS Performed at [facility], [place] [address], [place], [place] [address]    REPTSTATUS 01/31/2020 FINAL 01/30/2020 1613    Other culture-see note  Medications: Scheduled Meds: . amoxicillin-clavulanate  1 tablet Oral  Q12H  . aspirin EC  81 mg Oral Daily  . atorvastatin  40 mg Oral QHS  . budesonide-formoterol  2 puff Inhalation BID  . Chlorhexidine Gluconate Cloth  6 each Topical Daily  . cholecalciferol  2,000 Units Oral Q lunch  . collagenase   Topical Daily  . feeding supplement  1 Container Oral TID BM  . finasteride  5 mg Oral Q lunch  . fluticasone  2 spray Each Nare Daily  . furosemide  20 mg Oral q1600  . furosemide  40 mg Oral Daily  . guaiFENesin  600 mg Oral BID  . levothyroxine  50 mcg Oral Daily  . metoprolol succinate  12.5 mg Oral Daily  . mirabegron ER  25 mg Oral Daily  . multivitamin with minerals  1 tablet Oral Daily  . oxymetazoline  1 spray Each Nare BID  . pantoprazole  40 mg Oral Daily  . polyethylene glycol  17 g Oral BID  . predniSONE  10 mg Oral Daily  . saccharomyces boulardii  250  mg Oral BID  . senna-docusate  1 tablet Oral BID  . spironolactone  25 mg Oral Daily  . Tiotropium Bromide Monohydrate  2 puff Inhalation Daily  . vitamin B-12  1,000 mcg Oral Daily   Continuous Infusions:  Antimicrobials: Anti-infectives (From admission, onward)   Start     Dose/Rate Route Frequency Ordered Stop   01/29/20 1415  amoxicillin-clavulanate (AUGMENTIN) 875-125 MG per tablet 1 tablet        1 tablet Oral Every 12 hours 01/29/20 1320     01/29/20 1400  vancomycin (VANCOREADY) IVPB 500 mg/100 mL        500 mg 100 mL/hr over 60 Minutes Intravenous Every 24 hours 01/29/20 0830 01/30/20 1528   01/27/20 2300  vancomycin (VANCOCIN) IVPB 1000 mg/200 mL premix  Status:  Discontinued        1,000 mg 200 mL/hr over 60 Minutes Intravenous Every 48 hours 01/26/20 0844 01/27/20 1304   01/27/20 1400  vancomycin (VANCOREADY) IVPB 750 mg/150 mL  Status:  Discontinued        750 mg 150 mL/hr over 60 Minutes Intravenous Every 24 hours 01/27/20 1304 01/29/20 0830   01/25/20 2200  vancomycin (VANCOREADY) IVPB 750 mg/150 mL  Status:  Discontinued        750 mg 150 mL/hr over 60 Minutes  Intravenous Every 24 hours 01/24/20 1122 01/26/20 0844   01/25/20 0700  vancomycin (VANCOCIN) IVPB 1000 mg/200 mL premix  Status:  Discontinued        1,000 mg 200 mL/hr over 60 Minutes Intravenous Every 24 hours 01/24/20 1119 01/24/20 1122   01/24/20 1400  vancomycin (VANCOCIN) IVPB 1000 mg/200 mL premix  Status:  Discontinued        1,000 mg 200 mL/hr over 60 Minutes Intravenous Every 24 hours 01/24/20 1313 01/25/20 0549   01/24/20 0615  vancomycin (VANCOCIN) IVPB 1000 mg/200 mL premix        1,000 mg 200 mL/hr over 60 Minutes Intravenous  Once 01/24/20 0608 01/24/20 0827     Objective: Vitals: Today's Vitals   02/01/20 2213 02/02/20 0301 02/02/20 0618 02/02/20 0803  BP:  116/74  134/68  Pulse:  91  81  Resp:  17  16  Temp:  97.7 F (36.5 C)  97.8 F (36.6 C)  TempSrc:  Oral  Oral  SpO2:  99%  100%  Weight:      Height:      PainSc: Asleep  Asleep     Intake/Output Summary (Last 24 hours) at 02/02/2020 0810 Last data filed at 02/02/2020 0500 Gross per 24 hour  Intake --  Output 1100 ml  Net -1100 ml   Filed Weights   01/28/20 0859 01/29/20 0500 01/30/20 0446  Weight: 52.3 kg 52.2 kg 52.6 kg   Weight change:   Intake/Output from previous day: 01/23 0701 - 01/24 0700 In: -  Out: 1100 [Urine:1100] Intake/Output this shift: No intake/output data recorded. Filed Weights   01/28/20 0859 01/29/20 0500 01/30/20 0446  Weight: 52.3 kg 52.2 kg 52.6 kg   Examination: General exam: mildly lethargic, older than stated age,weak appearing. HEENT:Oral mucosa moist, Ear/Nose WNL grossly,dentition normal. Respiratory system: bilaterally diminished,no use of accessory muscle, non tender. Cardiovascular system: S1 & S2 +, regular, No JVD. Gastrointestinal system: Abdomen soft, NT,ND, BS+. Nervous System:Alert, awake, moving extremities and grossly nonfocal Extremities: Left lower extremity foot mild redness, left lower extremity edema present, right leg AKA stump with drainage  from revision site Skin: No rashes,no icterus. MSK:  thinl muscle bulk,tone, power  Data Reviewed: I have personally reviewed following labs and imaging studies CBC: Recent Labs  Lab 01/27/20 0313 01/28/20 0239 01/29/20 0124 01/30/20 0223 01/31/20 0225 02/01/20 0238 02/02/20 0511  WBC 7.4 12.2* 12.2* 10.6* 6.0 7.8 7.8  NEUTROABS 5.9 10.4* 10.1* 9.2* 5.6  --   --   HGB 9.6* 7.8* 7.5* 7.0* 6.6* 8.8* 9.3*  HCT 28.5* 23.3* 21.9* 20.1* 19.1* 24.6* 28.1*  MCV 95.6 96.3 96.1 95.3 93.6 91.1 94.0  PLT 176 163 167 158 180 196 937   Basic Metabolic Panel: Recent Labs  Lab 01/27/20 0313 01/28/20 0239 01/29/20 0124 01/30/20 0223 01/31/20 0225 02/01/20 0238 02/02/20 0511  NA 134* 131* 128* 130* 126* 129* 128*  K 4.4 4.3 4.9 4.1 4.3 4.6 4.2  CL 99 98 95* 94* 91* 93* 91*  CO2 _0 GLUCOSE 95 124* 109* 111* 136* 117* 93  BUN _1 CREATININE 1.01 0.99 1.07 1.07 0.86 0.86 0.90  CALCIUM 9.0 8.8* 9.0 8.7* 8.6* 9.1 9.2  MG 2.1 1.9  --  1.8 2.0  --   --    GFR: Estimated Creatinine Clearance: 42.2 mL/min (by C-G formula based on SCr of 0.9 mg/dL). Liver Function Tests: Recent Labs  Lab 01/30/20 0223  AST 24  ALT 15  ALKPHOS 44  BILITOT 0.7  PROT 4.6*  ALBUMIN 2.9*   No results for input(s): LIPASE, AMYLASE in the last 168 hours. No results for input(s): AMMONIA in the last 168 hours. Coagulation Profile: No results for input(s): INR, PROTIME in the last 168 hours. Cardiac Enzymes: No results for input(s): CKTOTAL, CKMB, CKMBINDEX, TROPONINI in the last 168 hours. BNP (last 3 results) No results for input(s): PROBNP in the last 8760 hours. HbA1C: No results for input(s): HGBA1C in the last 72 hours. CBG: No results for input(s): GLUCAP in the last 168 hours. Lipid Profile: No results for input(s): CHOL, HDL, LDLCALC, TRIG, CHOLHDL, LDLDIRECT in the last 72 hours. Thyroid Function Tests: No results for input(s): TSH, T4TOTAL, FREET4,  T3FREE, THYROIDAB in the last 72 hours. Anemia Panel: No results for input(s): VITAMINB12, FOLATE, FERRITIN, TIBC, IRON, RETICCTPCT in the last 72 hours. Sepsis Labs: No results for input(s): PROCALCITON, LATICACIDVEN in the last 168 hours.  Recent Results (from the past 240 hour(s))  SARS CORONAVIRUS 2 (TAT 6-24 HRS) Nasopharyngeal Nasopharyngeal Swab     Status: None   Collection Time: 01/24/20  6:30 AM   Specimen: Nasopharyngeal Swab  Result Value Ref Range Status   SARS Coronavirus 2 NEGATIVE NEGATIVE Final    Comment: (NOTE) SARS-CoV-2 target nucleic acids are NOT DETECTED.  The SARS-CoV-2 RNA is generally detectable in upper and lower respiratory specimens during the acute phase of infection. Negative results do not preclude SARS-CoV-2 infection, do not rule out co-infections with other pathogens, and should not be used as the sole basis for treatment or other patient management decisions. Negative results must be combined with clinical observations, patient history, and epidemiological information. The expected result is Negative.  Fact Sheet for Patients: SugarRoll.be  Fact Sheet for Healthcare Providers: https://www.woods-mathews.com/  This test is not yet approved or cleared by the [place] FDA and  has been authorized for detection and/or diagnosis of SARS-CoV-2 by FDA under an Emergency Use Authorization (EUA). This EUA will remain  in effect (meaning this test can be used) for the duration of the COVID-19 declaration under Se ction 564(b)(1)  of the Act, 21 U.S.C. section 360bbb-3(b)(1), unless the authorization is terminated or revoked sooner.  Performed at [facility], [place] [address] [place], [place] [address]   Culture, blood (routine x 2)     Status: None   Collection Time: 01/24/20  3:46 PM   Specimen: BLOOD  Result Value Ref Range Status   Specimen Description BLOOD LEFT ANTECUBITAL  Final   Special  Requests   Final    BOTTLES DRAWN AEROBIC AND ANAEROBIC Blood Culture adequate volume   Culture   Final    NO GROWTH 5 DAYS Performed at [facility], [place] [address] [place], [place] [address]    Report Status 01/29/2020 FINAL  Final  Culture, blood (routine x 2)     Status: None   Collection Time: 01/24/20  3:48 PM   Specimen: BLOOD  Result Value Ref Range Status   Specimen Description BLOOD RIGHT ANTECUBITAL  Final   Special Requests   Final    BOTTLES DRAWN AEROBIC AND ANAEROBIC Blood Culture results may not be optimal due to an inadequate volume of blood received in culture bottles   Culture   Final    NO GROWTH 5 DAYS Performed at [facility], [place] [address] [place], [place] [address]    Report Status 01/29/2020 FINAL  Final  Surgical PCR screen     Status: None   Collection Time: 01/27/20  6:03 AM   Specimen: Nasal Mucosa; Nasal Swab  Result Value Ref Range Status   MRSA, PCR NEGATIVE NEGATIVE Final   Staphylococcus aureus NEGATIVE NEGATIVE Final    Comment: (NOTE) The Xpert SA Assay (FDA approved for NASAL specimens in patients 63 years of age and older), is one component of a comprehensive surveillance program. It is not intended to diagnose infection nor to guide or monitor treatment. Performed at [facility], [place] [address], [place], [place] [address]   Expectorated sputum assessment w rflx to resp cult     Status: None   Collection Time: 01/30/20  4:13 PM   Specimen: Expectorated Sputum  Result Value Ref Range Status   Specimen Description EXPECTORATED SPUTUM  Final   Special Requests NONE  Final   Sputum evaluation   Final    Sputum specimen not acceptable for testing.  Please recollect.   Gram Stain Report Called to,Read Back By and Verified With: RN Dion Saucier [phone] MLM Performed at [facility], [place] [address], [place], [place] [address]    Report Status 01/31/2020 FINAL  Final     Radiology Studies: No results found.    LOS: 9 days   Antonieta Pert, MD [facility]  02/02/2020, 8:10 AM

## 2020-02-02 NOTE — Progress Notes (Signed)
ANTICOAGULATION CONSULT NOTE - Follow Up Consult  Pharmacy Consult:  IV Heparin Indication:  Afib with RVR  Allergies  Allergen Reactions  . Cefdinir Diarrhea and Other (See Comments)    Severe Diarrhea  . Nitrofurantoin Swelling and Other (See Comments)    Hand Swelling  . Sulfa Antibiotics Anaphylaxis and Swelling  . Sulfonamide Derivatives Swelling and Other (See Comments)    Facial/tongue swelling  . Tape Other (See Comments)    SKIN IS VERY THIN AND TEARS EASILY!!!!! Please do NOT use "plastic" tape- USE PAPER!!  . Ciprofloxacin Itching, Rash and Other (See Comments)    Red itchy hands  . Nitrofurantoin Swelling and Other (See Comments)    Swollen hands  . Amoxicillin Er Other (See Comments)    Frequest urination. Pt reports "not really" allergic 01/24/20  . Doxycycline Other (See Comments)    "Felt terrible" Pt wife reports "not really" allergic 01/24/20  . Levaquin [Levofloxacin] Itching and Rash  . Sertraline Anxiety and Other (See Comments)    Makes the patient jittery    Patient Measurements: Height: 5\' 8"  (172.7 cm) Weight: 52.6 kg (115 lb 15.4 oz) IBW/kg (Calculated) : 68.4 Heparin Dosing Weight: 52 kg  Vital Signs: Temp: 97.8 F (36.6 C) (01/24 0803) Temp Source: Oral (01/24 0803) BP: 119/66 (01/24 1610) Pulse Rate: 87 (01/24 1610)  Labs: Recent Labs    01/31/20 0225 02/01/20 0238 02/02/20 0511 02/02/20 1656  HGB 6.6* 8.8* 9.3*  --   HCT 19.1* 24.6* 28.1*  --   PLT 180 196 215  --   HEPARINUNFRC  --   --   --  0.13*  CREATININE 0.86 0.86 0.90  --     Estimated Creatinine Clearance: 42.2 mL/min (by C-G formula based on SCr of 0.9 mg/dL).   Medical History: Past Medical History:  Diagnosis Date  . Anxiety   . Arthritis    "generalized" (04/04/2017)  . Asthma   . Basal cell carcinoma    "left ear"  . BPH (benign prostatic hyperplasia)    severe; s/p multiple biopsies  . CAD (coronary artery disease)   . Carotid artery occlusion   .  Chronic respiratory failure ([place])   . Chronic rhinitis   . COPD (chronic obstructive pulmonary disease) (HCC)    2L [place] O2  . Diastolic heart failure ([place]) 2019  . Dilation of biliary tract   . Elevated troponin 03/09/2017  . Gallstones   . GERD (gastroesophageal reflux disease)   . History of blood transfusion    "w/his CABG" (04/04/2017)  . History of kidney stones   . Hyperlipidemia   . Hypertension   . On home oxygen therapy    "~ 24/7" (04/04/2017)  . Peripheral vascular disease ([place])   . Pneumonia 2019  . Squamous cell carcinoma of skin 04/23/2013   in situ-Right flank (txpbx)  . Squamous cell carcinoma of skin 01/23/2017   Bowens/ in siut- Left ear rim (CX3FU)  . Syncope and collapse   . Ureteral tumor 08/2015   had endoscopic procedure for evaluation, unable to reach for biopsy  . Urinary catheter (Foley) change required     Assessment: 87 yr old man presented with LLE cellulitis currently being managed conservatively.  Patient had an exposed bone at the R AKA amputation site, S/P revision on 01/27/20.  He had been on prophylactic Lovenox, which was held on 01/30/20, due to anemia with hematuria.  Now with afib/aflutter, pharmacy was consulted to start IV heparin.  Initial heparin level ~5 hrs after starting heparin infusion at 700 units/hr was 0.13 units/ml, which is below the goal range for this pt (although level only drawn ~5 hrs after heparin infusion started). H/H 9.3/28.1, plt 215. Per RN, no issues with IV or bleeding.  Goal of Therapy:  Heparin level 0.3-0.7 units/ml Monitor platelets by anticoagulation protocol: Yes   Plan:  Increase heparin infusion to 800 units/hr - no bolus with recent hematuria Check 8-hr heparin level Monitor daily heparin level and CBC Monitor closely for s/sx of bleeding  Gillermina Hu, PharmD, BCPS, [facility] Clinical Pharmacist 02/02/2020, 7:10 PM

## 2020-02-02 NOTE — Progress Notes (Addendum)
Progress Note  Patient Name: [name] Date of Encounter: 02/02/2020  [facility] Cardiologist: Sinclair Grooms, MD   Subjective   Asked to come back to see him due to rapid atrial arrhythmia. Had extremely frequent PACs, often w RBBB aberrancy, then converted to AFlutter w 2:1 AVB at 0900h. Some reduction in rate w IV metoprolol. He has mild upper chest discomfort ("like a muscle strain") that began about the same time as the tachycardia.  Inpatient Medications    Scheduled Meds: . amoxicillin-clavulanate  1 tablet Oral Q12H  . aspirin EC  81 mg Oral Daily  . atorvastatin  40 mg Oral QHS  . budesonide-formoterol  2 puff Inhalation BID  . Chlorhexidine Gluconate Cloth  6 each Topical Daily  . cholecalciferol  2,000 Units Oral Q lunch  . collagenase   Topical Daily  . feeding supplement  1 Container Oral TID BM  . finasteride  5 mg Oral Q lunch  . fluticasone  2 spray Each Nare Daily  . furosemide  20 mg Oral q1600  . furosemide  40 mg Oral Daily  . guaiFENesin  600 mg Oral BID  . levothyroxine  50 mcg Oral Daily  . [START ON 02/03/2020] metoprolol succinate  25 mg Oral Daily  . metoprolol tartrate  5 mg Intravenous Once  . mirabegron ER  25 mg Oral Daily  . multivitamin with minerals  1 tablet Oral Daily  . oxymetazoline  1 spray Each Nare BID  . pantoprazole  40 mg Oral Daily  . polyethylene glycol  17 g Oral BID  . predniSONE  10 mg Oral Daily  . saccharomyces boulardii  250 mg Oral BID  . senna-docusate  1 tablet Oral BID  . spironolactone  25 mg Oral Daily  . Tiotropium Bromide Monohydrate  2 puff Inhalation Daily  . vitamin B-12  1,000 mcg Oral Daily   Continuous Infusions:  PRN Meds: acetaminophen, ALPRAZolam, alum & mag hydroxide-simeth, bisacodyl, calcium carbonate, HYDROcodone-acetaminophen, levalbuterol, metoprolol tartrate, nitroGLYCERIN, polyvinyl alcohol   Vital Signs    Vitals:   02/01/20 1923 02/02/20 0301 02/02/20 0803 02/02/20 0930  BP:  (!) 145/74 116/74 134/68 120/88  Pulse: 95 91 81   Resp: 16 17 16    Temp: 97.6 F (36.4 C) 97.7 F (36.5 C) 97.8 F (36.6 C)   TempSrc: Oral Oral Oral   SpO2: 100% 99% 100%   Weight:      Height:        Intake/Output Summary (Last 24 hours) at 02/02/2020 1028 Last data filed at 02/02/2020 0500 Gross per 24 hour  Intake -  Output 1100 ml  Net -1100 ml   Last 3 Weights 01/30/2020 01/29/2020 01/28/2020  Weight (lbs) 115 lb 15.4 oz 115 lb 1.3 oz 115 lb 4.8 oz  Weight (kg) 52.6 kg 52.2 kg 52.3 kg      Telemetry    AFib w RVR - Personally Reviewed  ECG    Typical counterclockwise atrial flutter with 2:1 AV block - Personally Reviewed  Physical Exam  Frail, thin GEN: No acute distress.   Neck: No JVD Cardiac: irregular, no murmurs, rubs, or gallops.  Respiratory: Clear to auscultation bilaterally. GI: Soft, nontender, non-distended  MS: s/p R AKA. Neuro:  Nonfocal x HOH Psych: Normal affect   Labs    High Sensitivity Troponin:   Recent Labs  Lab 01/26/20 1406 01/26/20 1536  TROPONINIHS 107* 115*      Chemistry Recent Labs  Lab 01/30/20  8299 01/31/20 0225 02/01/20 0238 02/02/20 0511  NA 130* 126* 129* 128*  K 4.1 4.3 4.6 4.2  CL 94* 91* 93* 91*  CO2 25 27 29 29   GLUCOSE 111* 136* 117* 93  BUN 21 17 17 16   CREATININE 1.07 0.86 0.86 0.90  CALCIUM 8.7* 8.6* 9.1 9.2  PROT 4.6*  --   --   --   ALBUMIN 2.9*  --   --   --   AST 24  --   --   --   ALT 15  --   --   --   ALKPHOS 44  --   --   --   BILITOT 0.7  --   --   --   GFRNONAA >60 >60 >60 >60  ANIONGAP 11 8 7 8      Hematology Recent Labs  Lab 01/31/20 0225 02/01/20 0238 02/02/20 0511  WBC 6.0 7.8 7.8  RBC 2.04* 2.70* 2.99*  HGB 6.6* 8.8* 9.3*  HCT 19.1* 24.6* 28.1*  MCV 93.6 91.1 94.0  MCH 32.4 32.6 31.1  MCHC 34.6 35.8 33.1  RDW 13.2 13.6 13.7  PLT 180 196 215    BNPNo results for input(s): BNP, PROBNP in the last 168 hours.   DDimer No results for input(s): DDIMER in the last 168  hours.   Radiology    No results found.  Cardiac Studies   ECHO 01/28/2020  1. Left ventricular ejection fraction, by estimation, is 60 to 65%. The  left ventricle has normal function. The left ventricle has no regional  wall motion abnormalities. There is mild concentric left ventricular  hypertrophy. Left ventricular diastolic  parameters are indeterminate.  2. Right ventricular systolic function is normal. The right ventricular  size is normal.  3. The mitral valve is normal in structure. No evidence of mitral valve  regurgitation. No evidence of mitral stenosis.  4. The aortic valve is tricuspid. There is mild calcification of the  aortic valve. There is mild thickening of the aortic valve. Aortic valve  regurgitation is not visualized. Mild aortic valve sclerosis is present,  with no evidence of aortic valve  stenosis.  5. The inferior vena cava is normal in size with greater than 50%  respiratory variability, suggesting right atrial pressure of 3 mmHg.   NM Myoview 2019: IMPRESSION: 1. Large infarct involving the inferior wall including the inferior apex. Difficult to exclude peri-infarct ischemia at the apex and recommend correlation with ECG testing.  2. Abnormal wall motion along the inferior wall base and septal wall as described.  3. Left ventricular ejection fraction is 48%.  4. Non invasive risk stratification*: Intermediate  Patient Profile     87 y.o. male with CAD s/p CABG 2003 (inferior scar w peri-infarct ischemia on last nuclear study 2019), chronic HF w preserved LVEF, advanced PAD, COPD on chronic home O2 2l [place], HTN s/p R AKA on 01/27/2020, now with new onset atrial flutter-to-atrial fibrillation w RVR on 02/02/2020  Assessment & Plan    1. AFib: will start anticoagulation, promptly, since will allow [place] to consider DC cardioversion without concern for LA thrombus, if needed. However, he may be a less than optimal candidate for long-term DOAC.  Increase beta blocker dose. If bP low, will transition to IV amiodarone. Hopefully, both antiarrhythmic and anticoagulant interventions will be short term, if this resolves promptly, as might be expected with a postop arrhythmia. 2. CAD: suspect he has tachycardia-related angina, but better even with incomplete rate control. Beta  blocker preferred agent. Not a good candidate for invasive coronary evaluation. Would not be surprised if hsTrop I abnormal w tachyarrhythmia, but will not check unless symptoms occur outside of tachycardia. No ST levation seen on ECG. 3. PAD s/p R AKA: minimal oozing from stump. Watch for bleeding on heparin. 4. CHF: no signs or symptoms of HF exacerbation so far. Preserved LVEF. 5. COPD: no wheezing - watch for increased wheezing with higher beta blocker doses.     For questions or updates, please contact [place] Please consult [email or web address] for contact info under        Signed, Sanda Klein, MD  02/02/2020, 10:28 AM

## 2020-02-02 NOTE — Progress Notes (Signed)
PT Cancellation Note  Patient Details Name: [name] MRN: [id number] DOB: January 09, 1930   Cancelled Treatment:    Reason Eval/Treat Not Completed: Medical issues which prohibited therapy. Pt in aflutter this AM. Now with significant hypotension and tachycardic per RN. Will follow-up for PT treatment tomorrow as appropriate.  Mabeline Caras, PT, DPT Acute Rehabilitation Services  Pager [phone] Office [place] 02/02/2020, 2:03 PM

## 2020-02-02 NOTE — Progress Notes (Signed)
Patient has home CPAP and places himself on when ready. RT informed patient to have RT called if he needs assistance. RT will monitor as needed.

## 2020-02-02 NOTE — Progress Notes (Signed)
ANTICOAGULATION CONSULT NOTE - Initial Consult  Pharmacy Consult:  Heparin Indication:  Afib RVR  Allergies  Allergen Reactions  . Cefdinir Diarrhea and Other (See Comments)    Severe Diarrhea  . Nitrofurantoin Swelling and Other (See Comments)    Hand Swelling  . Sulfa Antibiotics Anaphylaxis and Swelling  . Sulfonamide Derivatives Swelling and Other (See Comments)    Facial/tongue swelling  . Tape Other (See Comments)    SKIN IS VERY THIN AND TEARS EASILY!!!!! Please do NOT use "plastic" tape- USE PAPER!!  . Ciprofloxacin Itching, Rash and Other (See Comments)    Red itchy hands  . Nitrofurantoin Swelling and Other (See Comments)    Swollen hands  . Amoxicillin Er Other (See Comments)    Frequest urination. Pt reports "not really" allergic 01/24/20  . Doxycycline Other (See Comments)    "Felt terrible" Pt wife reports "not really" allergic 01/24/20  . Levaquin [Levofloxacin] Itching and Rash  . Sertraline Anxiety and Other (See Comments)    Makes the patient jittery    Patient Measurements: Height: 5\' 8"  (172.7 cm) Weight: 52.6 kg (115 lb 15.4 oz) IBW/kg (Calculated) : 68.4 Heparin Dosing Weight: 52 kg  Vital Signs: Temp: 97.8 F (36.6 C) (01/24 0803) Temp Source: Oral (01/24 0803) BP: 114/63 (01/24 1037) Pulse Rate: 99 (01/24 1037)  Labs: Recent Labs    01/31/20 0225 02/01/20 0238 02/02/20 0511  HGB 6.6* 8.8* 9.3*  HCT 19.1* 24.6* 28.1*  PLT 180 196 215  CREATININE 0.86 0.86 0.90    Estimated Creatinine Clearance: 42.2 mL/min (by C-G formula based on SCr of 0.9 mg/dL).   Medical History: Past Medical History:  Diagnosis Date  . Anxiety   . Arthritis    "generalized" (04/04/2017)  . Asthma   . Basal cell carcinoma    "left ear"  . BPH (benign prostatic hyperplasia)    severe; s/p multiple biopsies  . CAD (coronary artery disease)   . Carotid artery occlusion   . Chronic respiratory failure ([place])   . Chronic rhinitis   . COPD (chronic obstructive  pulmonary disease) (HCC)    2L [address] [place] [address] O2  . Diastolic heart failure ([place]) 2019  . Dilation of biliary tract   . Elevated troponin 03/09/2017  . Gallstones   . GERD (gastroesophageal reflux disease)   . History of blood transfusion    "w/his CABG" (04/04/2017)  . History of kidney stones   . Hyperlipidemia   . Hypertension   . On home oxygen therapy    "~ 24/7" (04/04/2017)  . Peripheral vascular disease ([place])   . Pneumonia 2019  . Squamous cell carcinoma of skin 04/23/2013   in situ-Right flank (txpbx)  . Squamous cell carcinoma of skin 01/23/2017   Bowens/ in siut- Left ear rim (CX3FU)  . Syncope and collapse   . Ureteral tumor 08/2015   had endoscopic procedure for evaluation, unable to reach for biopsy  . Urinary catheter (Foley) change required      Assessment: 50 YOM presented with LLE cellulitis currently being managed conservatively.  Patient had an exposed bone at the R AKA amputation site, s/p revision on 01/27/20.  He has been on prophylactic Lovenox, which was held on 01/30/20 due to anemia with hematuria.  Now with Afib/Aflutter and Pharmacy consulted to start IV heparin.  Hemoglobin 9.3, platelet count WNL; no bleeding documented.  Goal of Therapy:  Heparin level 0.3-0.7 units/ml Monitor platelets by anticoagulation protocol: Yes   Plan:  Heparin infusion at 700 units/hr -  no bolus with recent hematuria Check 8 hr heparin level Daily heparin level and CBC Monitor closely for s/sx of bleeding  Tyrel Lex D. Mina Marble, PharmD, BCPS, [place] 02/02/2020, 10:46 AM

## 2020-02-02 NOTE — Progress Notes (Signed)
Pt's HR elevated. Cardiology aware with new orders, meds given. Pt on tele.     02/02/20 1243  Assess: MEWS Score  BP 92/73  Pulse Rate (!) 109  Assess: MEWS Score  MEWS Temp 0  MEWS Systolic 1  MEWS Pulse 1  MEWS RR 0  MEWS LOC 0  MEWS Score 2  MEWS Score Color Yellow  Assess: if the MEWS score is Yellow or Red  Were vital signs taken at a resting state? Yes  Focused Assessment No change from prior assessment  Early Detection of Sepsis Score *See Row Information* Low  MEWS guidelines implemented *See Row Information* Yes  Treat  MEWS Interventions Administered scheduled meds/treatments  Take Vital Signs  Increase Vital Sign Frequency  Yellow: Q 2hr X 2 then Q 4hr X 2, if remains yellow, continue Q 4hrs  Escalate  MEWS: Escalate Yellow: discuss with charge nurse/RN and consider discussing with provider and RRT  Notify: Provider  Provider Name/Title Antonieta Pert  Date Provider Notified 02/02/20  Time Provider Notified 0900  Notification Type Page  Notification Reason Change in status  Response See new orders  Document  Patient Outcome Not stable and remains on department

## 2020-02-02 NOTE — Plan of Care (Signed)
  Problem: Health Behavior/Discharge Planning: Goal: Ability to manage health-related needs will improve Outcome: Progressing   Problem: Activity: Goal: Risk for activity intolerance will decrease Outcome: Not Progressing   Problem: Pain Managment: Goal: General experience of comfort will improve Outcome: Not Progressing

## 2020-02-02 NOTE — Progress Notes (Signed)
  Progress Note    02/02/2020 3:13 PM 6 Days Post-Op  Subjective: He has complaints about pain and swelling in the right above-knee amputation site  Vitals:   02/02/20 1357 02/02/20 1439  BP: (!) 80/46 113/67  Pulse: (!) 130 90  Resp: 17   Temp:    SpO2: 99%     Physical Exam: Awake alert and oriented Nonlabored respirations Tachycardic to 138 Right above-knee amputation site with serous drainage staple line intact with minimal ecchymosis  CBC    Component Value Date/Time   WBC 7.8 02/02/2020 0511   RBC 2.99 (L) 02/02/2020 0511   HGB 9.3 (L) 02/02/2020 0511   HGB 10.6 (L) 09/22/2019 1556   HCT 28.1 (L) 02/02/2020 0511   HCT 31.7 (L) 09/22/2019 1556   PLT 215 02/02/2020 0511   PLT 258 09/22/2019 1556   MCV 94.0 02/02/2020 0511   MCV 93 09/22/2019 1556   MCH 31.1 02/02/2020 0511   MCHC 33.1 02/02/2020 0511   RDW 13.7 02/02/2020 0511   RDW 12.0 09/22/2019 1556   LYMPHSABS 0.1 (L) 01/31/2020 0225   LYMPHSABS 0.4 (L) 06/25/2019 1449   MONOABS 0.3 01/31/2020 0225   EOSABS 0.0 01/31/2020 0225   EOSABS 0.0 06/25/2019 1449   BASOSABS 0.0 01/31/2020 0225   BASOSABS 0.1 06/25/2019 1449    BMET    Component Value Date/Time   NA 128 (L) 02/02/2020 0511   NA 132 (L) 11/07/2019 1636   K 4.2 02/02/2020 0511   CL 91 (L) 02/02/2020 0511   CO2 29 02/02/2020 0511   GLUCOSE 93 02/02/2020 0511   BUN 16 02/02/2020 0511   BUN 23 11/07/2019 1636   CREATININE 0.90 02/02/2020 0511   CALCIUM 9.2 02/02/2020 0511   GFRNONAA >60 02/02/2020 0511   GFRAA 72 11/07/2019 1636    INR    Component Value Date/Time   INR 1.0 08/11/2019 0715     Intake/Output Summary (Last 24 hours) at 02/02/2020 1513 Last data filed at 02/02/2020 0500 Gross per 24 hour  Intake -  Output 1100 ml  Net -1100 ml     Assessment:  87 y.o. male is status post right above-knee amputation now healing well.  Plan: Okay for discharge from vascular standpoint We will follow up in 2 to 3 weeks for  staple removal.   Jenevie Casstevens C. Donzetta Matters, MD [facility] Office: [phone] Pager: [phone]  02/02/2020 3:13 PM

## 2020-02-03 DIAGNOSIS — I483 Typical atrial flutter: Secondary | ICD-10-CM | POA: Diagnosis not present

## 2020-02-03 DIAGNOSIS — R531 Weakness: Secondary | ICD-10-CM

## 2020-02-03 DIAGNOSIS — L03116 Cellulitis of left lower limb: Secondary | ICD-10-CM | POA: Diagnosis not present

## 2020-02-03 DIAGNOSIS — I5032 Chronic diastolic (congestive) heart failure: Secondary | ICD-10-CM | POA: Diagnosis not present

## 2020-02-03 DIAGNOSIS — I4891 Unspecified atrial fibrillation: Secondary | ICD-10-CM | POA: Diagnosis not present

## 2020-02-03 LAB — BASIC METABOLIC PANEL
Anion gap: 7 (ref 5–15)
BUN: 20 mg/dL (ref 8–23)
CO2: 30 mmol/L (ref 22–32)
Calcium: 8.7 mg/dL — ABNORMAL LOW (ref 8.9–10.3)
Chloride: 88 mmol/L — ABNORMAL LOW (ref 98–111)
Creatinine, Ser: 0.96 mg/dL (ref 0.61–1.24)
GFR, Estimated: >60 mL/min (ref >60)
Glucose, Bld: 95 mg/dL (ref 70–99)
Potassium: 4.3 mmol/L (ref 3.5–5.1)
Sodium: 125 mmol/L — ABNORMAL LOW (ref 135–145)

## 2020-02-03 LAB — CBC
HCT: 25 % — ABNORMAL LOW (ref 39.0–52.0)
Hemoglobin: 8.4 g/dL — ABNORMAL LOW (ref 13.0–17.0)
MCH: 31.6 pg (ref 26.0–34.0)
MCHC: 33.6 g/dL (ref 30.0–36.0)
MCV: 94 fL (ref 80.0–100.0)
Platelets: 213 10*3/uL (ref 150–400)
RBC: 2.66 MIL/uL — ABNORMAL LOW (ref 4.22–5.81)
RDW: 13.6 % (ref 11.5–15.5)
WBC: 7.2 10*3/uL (ref 4.0–10.5)
nRBC: 0 % (ref 0.0–0.2)

## 2020-02-03 LAB — HEPARIN LEVEL (UNFRACTIONATED): Heparin Unfractionated: 0.28 IU/mL — ABNORMAL LOW (ref 0.30–0.70)

## 2020-02-03 MED ORDER — ENOXAPARIN SODIUM 30 MG/0.3ML ~~LOC~~ SOLN
30.0000 mg | Freq: Two times a day (BID) | SUBCUTANEOUS | Status: DC
  Administered 2020-02-03 – 2020-02-05 (×5): 30 mg via SUBCUTANEOUS
  Filled 2020-02-03 (×5): qty 0.3

## 2020-02-03 MED ORDER — SODIUM CHLORIDE 1 G PO TABS
1.0000 g | ORAL_TABLET | Freq: Two times a day (BID) | ORAL | Status: DC
  Filled 2020-02-03 (×4): qty 1

## 2020-02-03 NOTE — Progress Notes (Signed)
Patient ID: [name], male   DOB: 04-07-1929, 87 y.o.   MRN: [id number]  This NP visited patient at the bedside as a follow up for palliative medicine needs and emotional support.  Patient is alert and oriented.  He is tired but this is his baseline.  Wife is at the bedside.  Continued education regarding his medical situation, he is high risk for decompensation.  Education offered regarding human mortality and the limitations of medical interventions to prolong life when a body fails to thrive.  Patient " maybe I have unrealistic expectations"  Readdressed code status. -Encouraged patient/family to consider DNR/DNI status understanding evidenced based poor outcomes in similar hospitalized patient, as the cause of arrest is likely associated with advanced chronic illness rather than an easily reversible acute cardio-pulmonary event.  Plan is  SNF for rehabilitation when stable.  Discussed with patient the importance of continued conversation with family and their  medical providers regarding overall plan of care and treatment options,  ensuring decisions are within the context of the patients values and GOCs.  MOST form discussed.  Questions and concerns addressed   Discussed with Dr Venetia Constable  Total time spent on the unit was 25 minutes  Greater than 50% of the time was spent in counseling and coordination of care  Wadie Lessen NP  Palliative Medicine Team Team Phone # [phone] Pager [phone]

## 2020-02-03 NOTE — Progress Notes (Signed)
Progress Note  Patient Name: [name] Date of Encounter: 02/03/2020  [facility] Cardiologist: Sinclair Grooms, MD   Subjective   Feels well.  Wants to start PT. Converted to NSR yesterday afternoon. Na drifting lower, normal K and renal parameters.  Inpatient Medications    Scheduled Meds: . amoxicillin-clavulanate  1 tablet Oral Q12H  . aspirin EC  81 mg Oral Daily  . atorvastatin  40 mg Oral QHS  . budesonide-formoterol  2 puff Inhalation BID  . Chlorhexidine Gluconate Cloth  6 each Topical Daily  . cholecalciferol  2,000 Units Oral Q lunch  . collagenase   Topical Daily  . feeding supplement  1 Container Oral TID BM  . finasteride  5 mg Oral Q lunch  . fluticasone  2 spray Each Nare Daily  . furosemide  20 mg Oral q1600  . furosemide  40 mg Oral Daily  . guaiFENesin  600 mg Oral BID  . levothyroxine  50 mcg Oral Daily  . metoprolol succinate  25 mg Oral Daily  . mirabegron ER  25 mg Oral Daily  . multivitamin with minerals  1 tablet Oral Daily  . pantoprazole  40 mg Oral Daily  . polyethylene glycol  17 g Oral BID  . predniSONE  10 mg Oral Daily  . saccharomyces boulardii  250 mg Oral BID  . senna-docusate  1 tablet Oral BID  . spironolactone  25 mg Oral Daily  . Tiotropium Bromide Monohydrate  2 puff Inhalation Daily  . vitamin B-12  1,000 mcg Oral Daily   Continuous Infusions: . heparin 850 Units/hr (02/03/20 0554)   PRN Meds: acetaminophen, ALPRAZolam, alum & mag hydroxide-simeth, bisacodyl, calcium carbonate, HYDROcodone-acetaminophen, levalbuterol, metoprolol tartrate, nitroGLYCERIN, polyvinyl alcohol   Vital Signs    Vitals:   02/02/20 2040 02/03/20 0500 02/03/20 0600 02/03/20 0800  BP: 112/67  110/62 139/76  Pulse: 85  77 81  Resp: 16   17  Temp: 97.6 F (36.4 C)   98 F (36.7 C)  TempSrc: Oral   Oral  SpO2: 100%  100% 100%  Weight:  52.1 kg    Height:        Intake/Output Summary (Last 24 hours) at 02/03/2020 0913 Last data  filed at 02/03/2020 0420 Gross per 24 hour  Intake 747.05 ml  Output 850 ml  Net -102.95 ml   Last 3 Weights 02/03/2020 01/30/2020 01/29/2020  Weight (lbs) 114 lb 13.8 oz 115 lb 15.4 oz 115 lb 1.3 oz  Weight (kg) 52.1 kg 52.6 kg 52.2 kg      Telemetry    SR w PACs since yesterday afternoon - Personally Reviewed  ECG    No new tracing - Personally Reviewed  Physical Exam  Elderly, frail GEN: No acute distress.   Neck: No JVD Cardiac: RRR w occ ectopy, no murmurs, rubs, or gallops.  Respiratory: diminished throughout, but clear to auscultation bilaterally. GI: Soft, nontender, non-distended  MS: 1+ left pedal edema; s/p R AKA Neuro:  Nonfocal, HOH Psych: Normal affect   Labs    High Sensitivity Troponin:   Recent Labs  Lab 01/26/20 1406 01/26/20 1536  TROPONINIHS 107* 115*      Chemistry Recent Labs  Lab 01/30/20 0223 01/31/20 0225 02/01/20 0238 02/02/20 0511 02/03/20 0339  NA 130*   < > 129* 128* 125*  K 4.1   < > 4.6 4.2 4.3  CL 94*   < > 93* 91* 88*  CO2 25   < >  29 29 30   GLUCOSE 111*   < > 117* 93 95  BUN 21   < > 17 16 20   CREATININE 1.07   < > 0.86 0.90 0.96  CALCIUM 8.7*   < > 9.1 9.2 8.7*  PROT 4.6*  --   --   --   --   ALBUMIN 2.9*  --   --   --   --   AST 24  --   --   --   --   ALT 15  --   --   --   --   ALKPHOS 44  --   --   --   --   BILITOT 0.7  --   --   --   --   GFRNONAA >60   < > >60 >60 >60  ANIONGAP 11   < > 7 8 7    < > = values in this interval not displayed.     Hematology Recent Labs  Lab 02/01/20 0238 02/02/20 0511 02/03/20 0339  WBC 7.8 7.8 7.2  RBC 2.70* 2.99* 2.66*  HGB 8.8* 9.3* 8.4*  HCT 24.6* 28.1* 25.0*  MCV 91.1 94.0 94.0  MCH 32.6 31.1 31.6  MCHC 35.8 33.1 33.6  RDW 13.6 13.7 13.6  PLT 196 215 213    BNPNo results for input(s): BNP, PROBNP in the last 168 hours.   DDimer No results for input(s): DDIMER in the last 168 hours.   Radiology    No results found.  Cardiac Studies   1. Left  ventricular ejection fraction, by estimation, is 60 to 65%. The  left ventricle has normal function. The left ventricle has no regional  wall motion abnormalities. There is mild concentric left ventricular  hypertrophy. Left ventricular diastolic  parameters are indeterminate.  2. Right ventricular systolic function is normal. The right ventricular  size is normal.  3. The mitral valve is normal in structure. No evidence of mitral valve  regurgitation. No evidence of mitral stenosis.  4. The aortic valve is tricuspid. There is mild calcification of the  aortic valve. There is mild thickening of the aortic valve. Aortic valve  regurgitation is not visualized. Mild aortic valve sclerosis is present,  with no evidence of aortic valve  stenosis.  5. The inferior vena cava is normal in size with greater than 50%  respiratory variability, suggesting right atrial pressure of 3 mmHg.  NM Myoview 2019: IMPRESSION: 1. Large infarct involving the inferior wall including the inferior apex. Difficult to exclude peri-infarct ischemia at the apex and recommend correlation with ECG testing.  2. Abnormal wall motion along the inferior wall base and septal wall as described.  3. Left ventricular ejection fraction is 48%.  4. Non invasive risk stratification*: Intermediate Patient Profile     87 y.o. male with CAD s/p CABG 2003 (inferior scar w peri-infarct ischemia on last nuclear study 2019), chronic HF w preserved LVEF, advanced PAD, COPD on chronic home O2 2l [place], HTN s/p R AKA on 01/27/2020, now with new onset atrial flutter-to-atrial fibrillation w RVR on 02/02/2020  Assessment & Plan    1. AFib: short-lived episode. He may be a less than optimal candidate for long-term DOAC. Increased beta blocker dose. 30 day event monitor after DC. 2. CAD: resolved tachycardia-related angina. 3. PAD s/p R AKA: wants to start rehab. 4. CHF: no signs or symptoms of HF exacerbation so far. Preserved  LVEF. Decrease the diuretic and fluid restrict 1500 ml/24h. 5. COPD:  no wheezing so far with higher beta blocker doses.         For questions or updates, please contact [place] Please consult [email or web address] for contact info under        Signed, Sanda Klein, MD  02/03/2020, 9:13 AM

## 2020-02-03 NOTE — Progress Notes (Signed)
PROGRESS NOTE    [name]  PRF:[id number] DOB: 09-20-1929 DOA: 01/23/2020 PCP: [name], MD   Chief Complaint  Patient presents with  . Wound Check   Brief Narrative: 87 year old male with complex comorbidities with hypertension, hyperlipidemia, COPD/chronic hypoxic respiratory failure on 2 L nasal cannula at the lowest trilogy at home, chronic diastolic CHF, status post right AKA presented with left foot pain and redness that did not improve on doxycycline.  He was found to have left foot redness going towards the ankle and will start on IV antibiotics.  Seen by vascular surgery revision surgery right AKA stump on 1/18, per vascular no further need of antibiotic on the right leg. Patient also treated for left lower cellulitis work up w/ MRSA screen negative blood culture no growth complete 7 days of antibiotic but left foot looked worse ESR/CRP slightly elevated, placed on Augmentin 1/20, and left foot appears better WBC normalized on 1/22.  As per vascular patient has declined amputation of the left foot.  Patient had episode of chest pain with mildly positive troponin cardiology was consulted. Seen by PT OT CIR recommended 1/124: Patient had episode of A. fib with RVR cardiology back on board  Subjective: Patient converted to normal sinus rhythm yesterday afternoon Afebrile overnight, on 2.5 L nasal cannula, at home on 2 L Sodium at 125, hemoglobin 8.4 g. Wife is at the bedside, appears frail weak  Assessment & Plan:  Cellulitis of left foot:MRSA screen negative blood culture no growth complete 7 days of antibiotic but left foot looked worse ESR/CRP slightly elevated, placed on Augmentin 1/20, and left foot appears better WBC normalized on 1/22. Pt would not want amputation if needed and wife states "if he needs amputation on left- he will be hospice". Plan for 7 days of Augmentin. Wbc counts stable,CRP normalized. Cont to monitor.   Right BKA stump wound/PVD: recent right  AKA, seen by vascular surgery revision surgery right AKA stump on 1/18, per vascular no further need of antibiotic on the right leg.  Small area of mild drainage. Continue per vascular- planning for follow-up in 2 to 3 weeks for staple removal.  A Flutter w/ RVR, new onset-Short episode, appreciate cardiology input on board beta-blocker increased not a candidate for long-term DOAC. 30 days event monitor being planned after discharge con heparin while here.  Chest pain with mildly positive troponin/history of CAD status post CABG, cards following.  Suspecting tachycardia related angina.  No acute changes in EKG continue plan as per cardiology.  Chronic diastolic CHF: Volume status is stable.    Chronic respiratory failure with hypoxia/hypercapnia/COPD/OSA obstructive sleep apnea: Normally on 2l [place] at home being weaned down to 2.5 L this morning.  Continue his home prednisone, Tri[facility], PT OT eval as tolerate.d  AKI-resolved. Recent Labs  Lab 01/30/20 0223 01/31/20 0225 02/01/20 0238 02/02/20 0511 02/03/20 0339  BUN 21 17 17 16 20   CREATININE 1.07 0.86 0.86 0.90 0.96   Hyponatremia: Sodium: 128-130.  125 this morning, try salt tablets short course.  Wife rndorses patient had similar episodes of hyponatremia when he is sick.  Recent Labs  Lab 01/30/20 0223 01/31/20 0225 02/01/20 0238 02/02/20 0511 02/03/20 0339  NA 130* 126* 129* 128* 125*   NSVT: cont bet blocker  Hyperlipidemia: on atorvastatin  Normocytic anemia patient with acute drop in hemoglobin as low as 6.6 g 01/30/20- Hemoglobin around 8.8-10 gm from 2021, suspect acute blood loss anemia from  #2??, in the setting of  anemia of chronic disease -s/p 1 unit PRBC transfusion 1/22. H/h drifting cont to monitor . Recent Labs  Lab 01/30/20 0223 01/31/20 0225 02/01/20 0238 02/02/20 0511 02/03/20 0339  HGB 7.0* 6.6* 8.8* 9.3* 8.4*  HCT 20.1* 19.1* 24.6* 28.1* 25.0*   Constipation: Continue MiraLAX, Senokot and as needed  suppository.  Chronic indwelling Foley catheter last changed 1/7 followed by [name] from urology.  Continue his home regimen.  Patient complains of some bladder irritation and some leakage around the Foley.  Foley is inflated well per nursing.  If continues to issue we can discuss with his urologist.  GOC: Full code, palliative care has been consulted. Does not appear bright.  Nutrition: Diet Order            Diet Heart Room service appropriate? Yes; Fluid consistency: Thin; Fluid restriction: 1500 mL Fluid  Diet effective now                 Nutrition Problem: Moderate Malnutrition Etiology: chronic illness (COPD) Signs/Symptoms: percent weight loss,mild fat depletion,moderate fat depletion Percent weight loss: 17.8 % Interventions: Boost Breeze,MVI  Body mass index is 17.46 kg/m.  Pressure Ulcer: Pressure Injury 01/24/20 Coccyx Mid;Lower Stage 2 -  Partial thickness loss of dermis presenting as a shallow open injury with a red, pink wound bed without slough. (Active)  01/24/20 1445  Location: Coccyx  Location Orientation: Mid;Lower  Staging: Stage 2 -  Partial thickness loss of dermis presenting as a shallow open injury with a red, pink wound bed without slough.  Wound Description (Comments):   Present on Admission: Yes    DVT prophylaxis: enoxaparin (LOVENOX) injection 30 mg Start: 02/03/20 1000 Place TED hose Start: 01/24/20 1314 Code Status:   Code Status: Full Code  Family Communication: plan of care discussed with patient and his wife at bedside updated again today.  Status is: Inpatient Remains inpatient appropriate because:Inpatient level of care appropriate due to severity of illness and For ongoing management of left foot cellulitis, a flutter RVR  Dispo: The patient is from: Home              Anticipated d/c is to: CIR vs SNF vs HHC- Palliative consulted.              Anticipated d/c date is:1-2 days              Patient currently is not medically stable  to d/c.   Difficult to place patient No  Consultants:see note  Procedures:see note  Culture/Microbiology    Component Value Date/Time   SDES EXPECTORATED SPUTUM 01/30/2020 1613   SPECREQUEST NONE 01/30/2020 1613   CULT  01/24/2020 1548    NO GROWTH 5 DAYS Performed at [facility], [place] [address] [place],  [address]    REPTSTATUS 01/31/2020 FINAL 01/30/2020 1613    Other culture-see note  Medications: Scheduled Meds: . amoxicillin-clavulanate  1 tablet Oral Q12H  . aspirin EC  81 mg Oral Daily  . atorvastatin  40 mg Oral QHS  . budesonide-formoterol  2 puff Inhalation BID  . Chlorhexidine Gluconate Cloth  6 each Topical Daily  . cholecalciferol  2,000 Units Oral Q lunch  . collagenase   Topical Daily  . enoxaparin (LOVENOX) injection  30 mg Subcutaneous Q12H  . feeding supplement  1 Container Oral TID BM  . finasteride  5 mg Oral Q lunch  . fluticasone  2 spray Each Nare Daily  . furosemide  40 mg Oral Daily  .  guaiFENesin  600 mg Oral BID  . levothyroxine  50 mcg Oral Daily  . metoprolol succinate  25 mg Oral Daily  . mirabegron ER  25 mg Oral Daily  . multivitamin with minerals  1 tablet Oral Daily  . pantoprazole  40 mg Oral Daily  . polyethylene glycol  17 g Oral BID  . predniSONE  10 mg Oral Daily  . saccharomyces boulardii  250 mg Oral BID  . senna-docusate  1 tablet Oral BID  . spironolactone  25 mg Oral Daily  . Tiotropium Bromide Monohydrate  2 puff Inhalation Daily  . vitamin B-12  1,000 mcg Oral Daily   Continuous Infusions:  Antimicrobials: Anti-infectives (From admission, onward)   Start     Dose/Rate Route Frequency Ordered Stop   01/29/20 1415  amoxicillin-clavulanate (AUGMENTIN) 875-125 MG per tablet 1 tablet        1 tablet Oral Every 12 hours 01/29/20 1320     01/29/20 1400  vancomycin (VANCOREADY) IVPB 500 mg/100 mL        500 mg 100 mL/hr over 60 Minutes Intravenous Every 24 hours 01/29/20 0830 01/30/20 1528   01/27/20 2300   vancomycin (VANCOCIN) IVPB 1000 mg/200 mL premix  Status:  Discontinued        1,000 mg 200 mL/hr over 60 Minutes Intravenous Every 48 hours 01/26/20 0844 01/27/20 1304   01/27/20 1400  vancomycin (VANCOREADY) IVPB 750 mg/150 mL  Status:  Discontinued        750 mg 150 mL/hr over 60 Minutes Intravenous Every 24 hours 01/27/20 1304 01/29/20 0830   01/25/20 2200  vancomycin (VANCOREADY) IVPB 750 mg/150 mL  Status:  Discontinued        750 mg 150 mL/hr over 60 Minutes Intravenous Every 24 hours 01/24/20 1122 01/26/20 0844   01/25/20 0700  vancomycin (VANCOCIN) IVPB 1000 mg/200 mL premix  Status:  Discontinued        1,000 mg 200 mL/hr over 60 Minutes Intravenous Every 24 hours 01/24/20 1119 01/24/20 1122   01/24/20 1400  vancomycin (VANCOCIN) IVPB 1000 mg/200 mL premix  Status:  Discontinued        1,000 mg 200 mL/hr over 60 Minutes Intravenous Every 24 hours 01/24/20 1313 01/25/20 0549   01/24/20 0615  vancomycin (VANCOCIN) IVPB 1000 mg/200 mL premix        1,000 mg 200 mL/hr over 60 Minutes Intravenous  Once 01/24/20 0608 01/24/20 0827     Objective: Vitals: Today's Vitals   02/02/20 2149 02/03/20 0500 02/03/20 0600 02/03/20 0800  BP:   110/62 139/76  Pulse:   77 81  Resp:    17  Temp:    98 F (36.7 C)  TempSrc:    Oral  SpO2:   100% 100%  Weight:  52.1 kg    Height:      PainSc: 3        Intake/Output Summary (Last 24 hours) at 02/03/2020 1024 Last data filed at 02/03/2020 0420 Gross per 24 hour  Intake 507.05 ml  Output 850 ml  Net -342.95 ml   Filed Weights   01/29/20 0500 01/30/20 0446 02/03/20 0500  Weight: 52.2 kg 52.6 kg 52.1 kg   Weight change:   Intake/Output from previous day: 01/24 0701 - 01/25 0700 In: 747.1 [P.O.:720; I.V.:27.1] Out: 1250 [Urine:1250] Intake/Output this shift: No intake/output data recorded. Filed Weights   01/29/20 0500 01/30/20 0446 02/03/20 0500  Weight: 52.2 kg 52.6 kg 52.1 kg   Examination: General exam:  AA, ill-appearing,  frail,NAD, weak appearing. HEENT:Oral mucosa moist, Ear/Nose WNL grossly, dentition normal. Respiratory system: bilaterally diminished breath sound,no use of accessory muscle Cardiovascular system: S1 & S2 +,No JVD,. Gastrointestinal system: Abdomen soft, NT,ND, BS+ Nervous System:Alert, awake, moving extremities and grossly nonfocal Extremities: Right BKA stump intact stable mild area drainage from the right side , left foot w/ mild redness no tenderness Skin: No rashes,no icterus. MSK: Normal muscle bulk,tone, power  Data Reviewed: I have personally reviewed following labs and imaging studies CBC: Recent Labs  Lab 01/28/20 0239 01/29/20 0124 01/30/20 0223 01/31/20 0225 02/01/20 0238 02/02/20 0511 02/03/20 0339  WBC 12.2* 12.2* 10.6* 6.0 7.8 7.8 7.2  NEUTROABS 10.4* 10.1* 9.2* 5.6  --   --   --   HGB 7.8* 7.5* 7.0* 6.6* 8.8* 9.3* 8.4*  HCT 23.3* 21.9* 20.1* 19.1* 24.6* 28.1* 25.0*  MCV 96.3 96.1 95.3 93.6 91.1 94.0 94.0  PLT 163 167 158 180 196 215 407   Basic Metabolic Panel: Recent Labs  Lab 01/28/20 0239 01/29/20 0124 01/30/20 0223 01/31/20 0225 02/01/20 0238 02/02/20 0511 02/03/20 0339  NA 131*   < > 130* 126* 129* 128* 125*  K 4.3   < > 4.1 4.3 4.6 4.2 4.3  CL 98   < > 94* 91* 93* 91* 88*  CO2 24   < > 25 27 29 29 30   GLUCOSE 124*   < > 111* 136* 117* 93 95  BUN 16   < > 21 17 17 16 20   CREATININE 0.99   < > 1.07 0.86 0.86 0.90 0.96  CALCIUM 8.8*   < > 8.7* 8.6* 9.1 9.2 8.7*  MG 1.9  --  1.8 2.0  --   --   --    < > = values in this interval not displayed.   GFR: Estimated Creatinine Clearance: 39.2 mL/min (by C-G formula based on SCr of 0.96 mg/dL). Liver Function Tests: Recent Labs  Lab 01/30/20 0223  AST 24  ALT 15  ALKPHOS 44  BILITOT 0.7  PROT 4.6*  ALBUMIN 2.9*   No results for input(s): LIPASE, AMYLASE in the last 168 hours. No results for input(s): AMMONIA in the last 168 hours. Coagulation Profile: No results for input(s): INR, PROTIME  in the last 168 hours. Cardiac Enzymes: No results for input(s): CKTOTAL, CKMB, CKMBINDEX, TROPONINI in the last 168 hours. BNP (last 3 results) No results for input(s): PROBNP in the last 8760 hours. HbA1C: No results for input(s): HGBA1C in the last 72 hours. CBG: No results for input(s): GLUCAP in the last 168 hours. Lipid Profile: No results for input(s): CHOL, HDL, LDLCALC, TRIG, CHOLHDL, LDLDIRECT in the last 72 hours. Thyroid Function Tests: No results for input(s): TSH, T4TOTAL, FREET4, T3FREE, THYROIDAB in the last 72 hours. Anemia Panel: No results for input(s): VITAMINB12, FOLATE, FERRITIN, TIBC, IRON, RETICCTPCT in the last 72 hours. Sepsis Labs: No results for input(s): PROCALCITON, LATICACIDVEN in the last 168 hours.  Recent Results (from the past 240 hour(s))  Culture, blood (routine x 2)     Status: None   Collection Time: 01/24/20  3:46 PM   Specimen: BLOOD  Result Value Ref Range Status   Specimen Description BLOOD LEFT ANTECUBITAL  Final   Special Requests   Final    BOTTLES DRAWN AEROBIC AND ANAEROBIC Blood Culture adequate volume   Culture   Final    NO GROWTH 5 DAYS Performed at [facility], [address], [place], [place]  [address]    Report Status 01/29/2020 FINAL  Final  Culture, blood (routine x 2)     Status: None   Collection Time: 01/24/20  3:48 PM   Specimen: BLOOD  Result Value Ref Range Status   Specimen Description BLOOD RIGHT ANTECUBITAL  Final   Special Requests   Final    BOTTLES DRAWN AEROBIC AND ANAEROBIC Blood Culture results may not be optimal due to an inadequate volume of blood received in culture bottles   Culture   Final    NO GROWTH 5 DAYS Performed at [facility], [place] [address], [place], [place] [address]    Report Status 01/29/2020 FINAL  Final  Surgical PCR screen     Status: None   Collection Time: 01/27/20  6:03 AM   Specimen: Nasal Mucosa; Nasal Swab  Result Value Ref Range Status   MRSA, PCR NEGATIVE  NEGATIVE Final   Staphylococcus aureus NEGATIVE NEGATIVE Final    Comment: (NOTE) The Xpert SA Assay (FDA approved for NASAL specimens in patients 74 years of age and older), is one component of a comprehensive surveillance program. It is not intended to diagnose infection nor to guide or monitor treatment. Performed at [facility], [place] [address] [place], [place] [address]   Expectorated sputum assessment w rflx to resp cult     Status: None   Collection Time: 01/30/20  4:13 PM   Specimen: Expectorated Sputum  Result Value Ref Range Status   Specimen Description EXPECTORATED SPUTUM  Final   Special Requests NONE  Final   Sputum evaluation   Final    Sputum specimen not acceptable for testing.  Please recollect.   Gram Stain Report Called to,Read Back By and Verified With: RN Dion Saucier [phone] MLM Performed at [facility], [place] [address] [place], [place] [address]    Report Status 01/31/2020 FINAL  Final     Radiology Studies: No results found.   LOS: 10 days   Antonieta Pert, MD [facility]  02/03/2020, 10:24 AM

## 2020-02-03 NOTE — Progress Notes (Signed)
ANTICOAGULATION CONSULT NOTE - Follow Up Consult  Pharmacy Consult:  IV Heparin Indication:  Afib with RVR   Labs: Recent Labs    02/01/20 0238 02/02/20 0511 02/02/20 1656 02/03/20 0339  HGB 8.8* 9.3*  --  8.4*  HCT 24.6* 28.1*  --  25.0*  PLT 196 215  --  213  HEPARINUNFRC  --   --  0.13* 0.28*  CREATININE 0.86 0.90  --  0.96   Assessment: 87 yr old [name] presented with LLE cellulitis currently being managed conservatively.  Patient had an exposed bone at the R AKA amputation site, S/P revision on 01/27/20.  He had been on prophylactic Lovenox, which was held on 01/30/20, due to anemia with hematuria.  Now with afib/aflutter, pharmacy was consulted to start IV heparin.    Heparin level this am 0.28 units/ml  Hg 8.4, PTLC 213 Goal of Therapy:  Heparin level 0.3-0.7 units/ml Monitor platelets by anticoagulation protocol: Yes   Plan:  Increase heparin infusion to 850 units/hr Monitor daily heparin level and CBC Monitor closely for s/sx of bleeding  Thanks for allowing pharmacy to be a part of this patient's care.  [name], PharmD Clinical Pharmacist  02/03/2020, 4:16 AM

## 2020-02-03 NOTE — Progress Notes (Addendum)
Physical Therapy Treatment Patient Details Name: [name] MRN: [id number] DOB: Jan 13, 1929 Today's Date: 02/03/2020    History of Present Illness Pt is an 87 y.o. male admitted 01/23/20 with L foot cellulitis, as well as R AKA stump wound with bone exposure. S/p R AKA revision 1/18. Pt declined L foot amputation. PMH includes R AKA (08/2019), COPD (on 2L home O2), CAD s/p CABG (2003), CHF, PVD, PAF, HTN, HOH, depression, anxiety.   PT Comments    Pt demonstrates improvement in activity tolerance today, motivated to participate, especially since medical issues have prohibited mobilizing with PT/OT since last week. Today's session focused on transfer training, including standing and stand pivots, pt requiring consistent modA with this. Pt tolerated multiple bouts of activity with seated rest breaks to recover from fatigue. Wife present throughout session and supportive. If pt continues to demonstrate improved tolerance to therapies, recommend intensive CIR-level therapies to maximize functional mobility and independence, as well as decrease caregiver burden, before return home.   Follow Up Recommendations  CIR;Supervision for mobility/OOB     Equipment Recommendations   (TBD)    Recommendations for Other Services Rehab consult, OT consult     Precautions / Restrictions Precautions Precautions: Fall;Other (comment) Precaution Comments: H/o R AKA (s/p revision 1/18)    Mobility  Bed Mobility Overal bed mobility: Needs Assistance             General bed mobility comments: Received long sitting in bed from elevated HOB; increased time and effort initiating task to scoot to EOB (pt reports due to fatigue), modA to assist hips with pt using BUEs to scoot to EOB  Transfers Overall transfer level: Needs assistance Equipment used: 1 person hand held assist;Rolling walker (2 wheeled);None Transfers: Sit to/from Stand Sit to Stand: Mod assist   Squat pivot transfers: Mod assist      General transfer comment: Initial sit<>stand from EOB without DME, pt able to partially stand with modA for trunk elevation and LUE support, but unable to achieve fully upright without RUE support; additional 2x trial with RW, able to achieve fully standing with modA; final trial with squat pivot transfer to recliner with modA  Ambulation/Gait                 Stairs             Wheelchair Mobility    Modified Rankin (Stroke Patients Only)       Balance Overall balance assessment: History of Falls;Needs assistance Sitting-balance support: No upper extremity supported Sitting balance-Leahy Scale: Fair Sitting balance - Comments: Prolonged sitting EOB; forward flexed posture requiring cues to extend trunk and sit upright     Standing balance-Leahy Scale: Poor Standing balance comment: Reliant on UE support and external assist                            Cognition Arousal/Alertness: Awake/alert Behavior During Therapy: WFL for tasks assessed/performed Overall Cognitive Status: Impaired/Different from baseline Area of Impairment: Problem solving;Attention;Awareness;Following commands                   Current Attention Level: Selective   Following Commands: Follows one step commands consistently;Follows one step commands with increased time   Awareness: Emergent Problem Solving: Requires verbal cues General Comments: WFL for simple tasks. Apparent slowed processing at times, potentially exacerbated by fatigue and [facility]      Exercises General Exercises - Lower Extremity Ankle Circles/Pumps: AROM;Left;Seated Long  Arc Quad: AROM;Left;Seated Hip Flexion/Marching: AROM;Left;Seated Amputee Exercises Chair Push Up: AROM;Both;Seated    General Comments General comments (skin integrity, edema, etc.): Wife present and supportive. SpO2 99-100% on 3L O2 [place]      Pertinent Vitals/Pain Pain Assessment: Faces Faces Pain Scale: Hurts little more Pain  Location: "thoracic region" of back, R AKA revision incision Pain Descriptors / Indicators: Grimacing;Guarding Pain Intervention(s): Monitored during session;Repositioned    Home Living                      Prior Function            PT Goals (current goals can now be found in the care plan section) Acute Rehab PT Goals Patient Stated Goal: Post-acute rehab at [facility] PT Goal Formulation: With patient/family Time For Goal Achievement: 02/17/20 Potential to Achieve Goals: Good Progress towards PT goals: Progressing toward goals    Frequency    Min 3X/week      PT Plan Current plan remains appropriate    Co-evaluation              AM-PAC PT "6 Clicks" Mobility   Outcome Measure  Help needed turning from your back to your side while in a flat bed without using bedrails?: A Little Help needed moving from lying on your back to sitting on the side of a flat bed without using bedrails?: A Lot Help needed moving to and from a bed to a chair (including a wheelchair)?: A Lot Help needed standing up from a chair using your arms (e.g., wheelchair or bedside chair)?: A Lot Help needed to walk in hospital room?: Total Help needed climbing 3-5 steps with a railing? : Total 6 Click Score: 11    End of Session Equipment Utilized During Treatment: Gait belt;Oxygen Activity Tolerance: Patient tolerated treatment well Patient left: in chair;with call bell/phone within reach;with chair alarm set;with family/visitor present (seated on pt's [place] cushion) Nurse Communication: Mobility status PT Visit Diagnosis: Unsteadiness on feet (R26.81);Difficulty in walking, not elsewhere classified (R26.2);Muscle weakness (generalized) (M62.81)     Time: 7494-4967 PT Time Calculation (min) (ACUTE ONLY): 36 min  Charges:  $Therapeutic Activity: 23-37 mins                    Mabeline Caras, PT, DPT Acute Rehabilitation Services  Pager [phone] Office [phone]  Derry Lory 02/03/2020, 3:37 PM

## 2020-02-04 ENCOUNTER — Telehealth: Payer: Self-pay | Admitting: Internal Medicine

## 2020-02-04 DIAGNOSIS — L03116 Cellulitis of left lower limb: Secondary | ICD-10-CM | POA: Diagnosis not present

## 2020-02-04 LAB — CBC
HCT: 25.3 % — ABNORMAL LOW (ref 39.0–52.0)
Hemoglobin: 8.5 g/dL — ABNORMAL LOW (ref 13.0–17.0)
MCH: 31.6 pg (ref 26.0–34.0)
MCHC: 33.6 g/dL (ref 30.0–36.0)
MCV: 94.1 fL (ref 80.0–100.0)
Platelets: 222 10*3/uL (ref 150–400)
RBC: 2.69 MIL/uL — ABNORMAL LOW (ref 4.22–5.81)
RDW: 13.5 % (ref 11.5–15.5)
WBC: 7.7 10*3/uL (ref 4.0–10.5)
nRBC: 0 % (ref 0.0–0.2)

## 2020-02-04 LAB — BASIC METABOLIC PANEL
Anion gap: 8 (ref 5–15)
BUN: 18 mg/dL (ref 8–23)
CO2: 30 mmol/L (ref 22–32)
Calcium: 8.7 mg/dL — ABNORMAL LOW (ref 8.9–10.3)
Chloride: 88 mmol/L — ABNORMAL LOW (ref 98–111)
Creatinine, Ser: 1.02 mg/dL (ref 0.61–1.24)
GFR, Estimated: >60 mL/min (ref >60)
Glucose, Bld: 85 mg/dL (ref 70–99)
Potassium: 4.1 mmol/L (ref 3.5–5.1)
Sodium: 126 mmol/L — ABNORMAL LOW (ref 135–145)

## 2020-02-04 LAB — SARS CORONAVIRUS 2 (TAT 6-24 HRS): SARS Coronavirus 2: NEGATIVE

## 2020-02-04 MED ORDER — SYMBICORT 160-4.5 MCG/ACT IN AERO: 2.0000 | INHALATION_SPRAY | Freq: Two times a day (BID) | RESPIRATORY_TRACT | 5 refills | Status: DC

## 2020-02-04 NOTE — Progress Notes (Signed)
Inpatient Rehab Admissions Coordinator Note:   Per therapy recommendations, pt was screened for CIR candidacy by Shann Medal, PT, DPT.  At this time we are recommending a CIR consult and I will place an order per our protocol.  Please contact me with questions.   Shann Medal, PT, DPT [phone] 02/04/20 9:53 AM

## 2020-02-04 NOTE — Progress Notes (Signed)
Physical Therapy Treatment Patient Details Name: [name] MRN: [id number] DOB: Feb 09, 1929 Today's Date: 02/04/2020    History of Present Illness Pt is an 87 y.o. male admitted 01/23/20 with L foot cellulitis, as well as R AKA stump wound with bone exposure. S/p R AKA revision 1/18. Pt declined L foot amputation. PMH includes R AKA (08/2019), COPD (on 2L home O2), CAD s/p CABG (2003), CHF, PVD, PAF, HTN, HOH, depression, anxiety. Covid test negative, plan for CIR 1/27.    PT Comments    Pt supine on arrival, agreeable to therapy session and with good participation and fair tolerance for session. Pt reporting some nausea/heartburn (just finished eating pizza for dinner) and deferring standing transfers, but agreeable to practice seated scooting along EOB, seated exercises and bed mobility. Pt needed minA to min guard A for bed mobility tasks and AROM for most LLE supine/seated therapeutic exercises. Also instructed pt/spouse on importance of BUE exercises for improving blood pressure pre/post transfer and pressure offloading strategies to prevent skin breakdown. SpO2 WNL on 2L O2 [place] throughout session. Pt continues to benefit from PT services to progress toward functional mobility goals. Continue to recommend CIR.  Follow Up Recommendations  CIR;Supervision for mobility/OOB     Equipment Recommendations  None recommended by PT    Recommendations for Other Services Rehab consult     Precautions / Restrictions Precautions Precautions: Fall;Other (comment) Precaution Comments: H/o R AKA (s/p revision 1/18) Required Braces or Orthoses: Other Brace Other Brace: had post op shoe prior to R AKA revision Restrictions Weight Bearing Restrictions: Yes RLE Weight Bearing: Non weight bearing    Mobility  Bed Mobility Overal bed mobility: Needs Assistance Bed Mobility: Sit to Supine       Sit to supine: Min guard;HOB elevated   General bed mobility comments: received sitting EOB finishing  dinner.  Transfers Overall transfer level: Needs assistance   Transfers: Lateral/Scoot Transfers          Lateral/Scoot Transfers: Min assist General transfer comment: along EOB toward HOB to simulate slide board transfer. pt able to lift hips with BUE support to scoot hips fairly well, minA at times for transfer pad assist; pt deferring to stand due to heartburn/just ate and mild nausea  Ambulation/Gait                 Stairs             Wheelchair Mobility    Modified Rankin (Stroke Patients Only)       Balance Overall balance assessment: History of Falls;Needs assistance Sitting-balance support: No upper extremity supported Sitting balance-Leahy Scale: Fair Sitting balance - Comments: seated EOB ~10 mins with no LOB, occasional cues for upright posture needed     Standing balance-Leahy Scale: Poor Standing balance comment: Reliant on UE support and external assist                            Cognition Arousal/Alertness: Awake/alert Behavior During Therapy: WFL for tasks assessed/performed Overall Cognitive Status: Impaired/Different from baseline Area of Impairment: Problem solving;Attention;Awareness                       Following Commands: Follows one step commands consistently;Follows one step commands with increased time   Awareness: Emergent Problem Solving: Requires verbal cues General Comments: WFL for simple tasks. Apparent slowed processing at times, potentially exacerbated by fatigue and HOH. Pt stating "I can't" before trying  to attempt mobility      Exercises General Exercises - Lower Extremity Ankle Circles/Pumps: AROM;Left;Seated;10 reps Long Arc Quad: AROM;Left;15 reps;Seated Heel Slides: AROM;Left;10 reps;Supine Hip ABduction/ADduction: Left;10 reps;Supine;AAROM Hip Flexion/Marching: AROM;Left;10 reps;Seated Amputee Exercises Chair Push Up:  (verbal/visual demo, pt deferring to attempt due to fatigue and not  wanting to sit up in chair after sitting EOB to eat)    General Comments        Pertinent Vitals/Pain Pain Assessment: Faces Faces Pain Scale: Hurts a little bit Pain Location: R AKA revision incision Pain Descriptors / Indicators: Grimacing;Guarding Pain Intervention(s): Monitored during session;Repositioned (L heel floated at end of session)    Home Living                      Prior Function            PT Goals (current goals can now be found in the care plan section) Acute Rehab PT Goals Patient Stated Goal: Post-acute re[facility] at [facility] PT Goal Formulation: With patient/family Time For Goal Achievement: 02/17/20 Potential to Achieve Goals: Good Progress towards PT goals: Progressing toward goals    Frequency    Min 3X/week      PT Plan Current plan remains appropriate    Co-evaluation              AM-PAC PT "6 Clicks" Mobility   Outcome Measure  Help needed turning from your back to your side while in a flat bed without using bedrails?: A Little Help needed moving from lying on your back to sitting on the side of a flat bed without using bedrails?: A Little Help needed moving to and from a bed to a chair (including a wheelchair)?: A Lot Help needed standing up from a chair using your arms (e.g., wheelchair or bedside chair)?: A Lot Help needed to walk in hospital room?: Total Help needed climbing 3-5 steps with a railing? : Total 6 Click Score: 12    End of Session Equipment Utilized During Treatment: Oxygen (transfer pad) Activity Tolerance: Patient tolerated treatment well Patient left: in bed;with call bell/phone within reach;with bed alarm set;Other (comment) (L heel floated) Nurse Communication: Mobility status PT Visit Diagnosis: Unsteadiness on feet (R26.81);Difficulty in walking, not elsewhere classified (R26.2);Muscle weakness (generalized) (M62.81)     Time: 6945-0388 PT Time Calculation (min) (ACUTE ONLY): 20 min  Charges:   $Therapeutic Exercise: 8-22 mins                     Exavior Kimmons P., PTA Acute Rehabilitation Services Pager: [phone] Office: [place] 02/04/2020, 6:09 PM

## 2020-02-04 NOTE — Evaluation (Signed)
Occupational Therapy Evaluation Patient Details Name: [name] MRN: [id number] DOB: March 09, 1929 Today's Date: 02/04/2020    History of Present Illness Pt is an 87 y.o. male admitted 01/23/20 with L foot cellulitis, as well as R AKA stump wound with bone exposure. S/p R AKA revision 1/18. Pt declined L foot amputation. PMH includes R AKA (08/2019), COPD (on 2L home O2), CAD s/p CABG (2003), CHF, PVD, PAF, HTN, HOH, depression, anxiety.   Clinical Impression   Pt presents with decline in function and safety with ADLs and ADL mobility with impaired strength, balance and endurance. Pt very HOH at baseline. Pt with functional deficits listed below and would benefit from acute OT services to address impairments to maximize level of function and safety    Follow Up Recommendations  CIR    Equipment Recommendations   (TBD at next venue of care)    Recommendations for Other Services       Precautions / Restrictions Precautions Precautions: Fall;Other (comment) Precaution Comments: H/o R AKA (s/p revision 1/18) Other Brace: had post op shoe prior to R AKA revision Restrictions Weight Bearing Restrictions: Yes RLE Weight Bearing: Non weight bearing      Mobility Bed Mobility Overal bed mobility: Needs Assistance       Supine to sit: Min guard;HOB elevated          Transfers Overall transfer level: Needs assistance Equipment used: 1 person hand held assist;Rolling walker (2 wheeled) Transfers: Sit to/from Stand Sit to Stand: Mod assist   Squat pivot transfers: Mod assist          Balance Overall balance assessment: History of Falls;Needs assistance Sitting-balance support: No upper extremity supported Sitting balance-Leahy Scale: Fair       Standing balance-Leahy Scale: Poor Standing balance comment: Reliant on UE support and external assist                           ADL either performed or assessed with clinical judgement   ADL Overall ADL's : Needs  assistance/impaired Eating/Feeding: Set up;Independent;Sitting   Grooming: Wash/dry hands;Wash/dry face;Brushing hair;Min guard;Sitting   Upper Body Bathing: Min guard;Sitting   Lower Body Bathing: Maximal assistance;Sitting/lateral leans   Upper Body Dressing : Min guard;Sitting   Lower Body Dressing: Maximal assistance;Sitting/lateral leans   Toilet Transfer: Moderate assistance;RW;Stand-pivot;Cueing for safety Toilet Transfer Details (indicate cue type and reason): simulated Toileting- Clothing Manipulation and Hygiene: Total assistance       Functional mobility during ADLs: Moderate assistance;Cueing for safety;Rolling walker       Vision Baseline Vision/History: Wears glasses Wears Glasses: At all times Patient Visual Report: No change from baseline       Perception     Praxis      Pertinent Vitals/Pain Pain Assessment: Faces Faces Pain Scale: Hurts a little bit Pain Location: "thoracic region" of back, R AKA revision incision Pain Descriptors / Indicators: Grimacing;Guarding Pain Intervention(s): Monitored during session;Repositioned     Hand Dominance Right   Extremity/Trunk Assessment Upper Extremity Assessment Upper Extremity Assessment: Generalized weakness   Lower Extremity Assessment Lower Extremity Assessment: Defer to PT evaluation       Communication Communication Communication: HOH   Cognition Arousal/Alertness: Awake/alert Behavior During Therapy: WFL for tasks assessed/performed Overall Cognitive Status: Impaired/Different from baseline Area of Impairment: Problem solving;Attention;Awareness;Following commands                       Following Commands: Follows one  step commands consistently;Follows one step commands with increased time     Problem Solving: Requires verbal cues General Comments: WFL for simple tasks. Apparent slowed processing at times, potentially exacerbated by fatigue and HOH. Pt stating "I can't" before  trying to attempt mobility   General Comments       Exercises     Shoulder Instructions      Home Living Family/patient expects to be discharged to:: Private residence Living Arrangements: Spouse/significant other Available Help at Discharge: Family;Available 24 hours/day Type of Home: House Home Access: Level entry     Home Layout: Multi-level;Bed/bath upstairs Alternate Level Stairs-Number of Steps: Enters from carport with level entry. 9 to kitchen/dining room/living room. 6 additional steps to bedroom/bathroom with 3 bedrooms and 2 bathrooms with 1 rail with each set of stairs. Alternate Level Stairs-Rails: Right Bathroom Shower/Tub: Walk-in shower;Tub/shower unit   Bathroom Toilet: Standard Bathroom Accessibility: Yes   Home Equipment: Walker - 2 wheels;Cane - single point;Shower seat;Transport chair;Tub bench   Additional Comments: O2 2L, stair lift      Prior Functioning/Environment Level of Independence: Needs assistance  Gait / Transfers Assistance Needed: independent with transfers to the w/c, uses RW when working with PT -walking 10 steps with chair follow; independent with w/c mobility. has w/c on 2 different floors. 2 falls on thanksgiving and xmas ADL's / Homemaking Assistance Needed: wife has been assiting with LB bathing and dressing   Comments: Has been working with PT for HHPT        OT Problem List: Decreased strength;Impaired balance (sitting and/or standing);Pain;Decreased activity tolerance;Decreased coordination      OT Treatment/Interventions: Self-care/ADL training;Therapeutic exercise;Patient/family education;Balance training;Energy conservation;DME and/or AE instruction    OT Goals(Current goals can be found in the care plan section) Acute Rehab OT Goals Patient Stated Goal: Post-acute rehab at [facility] OT Goal Formulation: With patient/family Time For Goal Achievement: 02/11/20 Potential to Achieve Goals: Good ADL Goals Pt Will Perform  Grooming: with supervision;with set-up;sitting Pt Will Perform Upper Body Bathing: with supervision;with set-up;sitting Pt Will Perform Lower Body Bathing: with mod assist;sitting/lateral leans Pt Will Perform Upper Body Dressing: with supervision;with set-up;sitting Pt Will Transfer to Toilet: with min assist;with min guard assist;stand pivot transfer;bedside commode  OT Frequency: Min 2X/week   Barriers to D/C:            Co-evaluation              AM-PAC OT "6 Clicks" Daily Activity     Outcome Measure Help from another person eating meals?: None Help from another person taking care of personal grooming?: A Little Help from another person toileting, which includes using toliet, bedpan, or urinal?: Total Help from another person bathing (including washing, rinsing, drying)?: A Lot Help from another person to put on and taking off regular upper body clothing?: A Little Help from another person to put on and taking off regular lower body clothing?: Total 6 Click Score: 14   End of Session Equipment Utilized During Treatment: Gait belt;Rolling walker  Activity Tolerance: Patient tolerated treatment well Patient left: in bed;with family/visitor present  OT Visit Diagnosis: Other abnormalities of gait and mobility (R26.89);Unsteadiness on feet (R26.81);Muscle weakness (generalized) (M62.81);Pain Pain - Right/Left: Right Pain - part of body: Leg                Time: 1245-8099 OT Time Calculation (min): 31 min Charges:  OT General Charges $OT Visit: 1 Visit OT Evaluation $OT Eval Moderate Complexity: 1 Mod OT Treatments $Self  Care/Home Management : 8-22 mins    Emmit Alexanders [facility] 02/04/2020, 2:14 PM

## 2020-02-04 NOTE — H&P (Incomplete)
Physical Medicine and Rehabilitation Admission H&P    Chief Complaint  Patient presents with  . Wound Check  : HPI: [name]. [name] is a 87 year old right-handed male with history significant for chronic urinary retention with chronic indwelling Foley catheter tube followed by [name] of urology services last changed 01/16/2020, hypertension, hyperlipidemia, CAD with CABG 2003 followed by cardiology services Dr. Daneen Schick, COPD 2 L oxygen, peripheral vascular disease, diastolic congestive heart failure, sacral decubitus ulcer, right AKA 08/11/2019 as well as history of chronic wounds to lower extremities receiving inpatient rehab services 08/21/2019 to 09/01/2019 and was discharged to skilled nursing facility.  Presented 01/24/2020 with left lower extremity cellulitis he had been seeing his primary care provider maintained on antibiotics.  He was sent to the emergency department for possible admission for cellulitis and initially sent home on doxycycline.  He was later seen at the wound clinic for his left foot continued to worsen despite antibiotics.  Vascular surgery follow-up Dr. Donzetta Matters and on examination was noted to have open wound of the right AKA stump with exposed bone.  X-rays obtained showing some demineralization but no osteomyelitis.  MRI was ordered to evaluate osteomyelitis showing no areas of abnormal marrow signal or cortical destruction.  Patient did undergo revision of right AKA 01/27/2020 per Dr. Donzetta Matters.  Work-up of left lower extremity cellulitis MRSA screen negative blood cultures no growth he did complete 7 days antibiotics but left foot looked worse ESR/CRP slightly elevated placed on Augmentin 01/29/2020 to be completed 02/05/2020 with improvement in latest WBC 7.7..  Patient had declined amputation of left foot.  Hospital course patient has had some hyponatremia 130-126.  Cardiology services consulted during hospital stay for episodic chest pain/atrial flutter with RVR of new onset.   Placed on Toprol not a candidate for long-term DOAC.  30-day event monitor planned at discharge.  Currently holding Lasix and Aldactone due to hyponatremia and resume as needed.  Palliative care was consulted to establish goals of care.  Patient with decubitus ulcer buttocks Santyl ointment twice daily with wound care nurse follow-up.  Random Covid testing 02/04/2020 negative.  Therapy evaluations completed due to patient decrease in functional ability was admitted for a comprehensive rehab program.  Review of Systems  Constitutional: Negative for chills and fever.  HENT: Negative for hearing loss.   Eyes: Negative for blurred vision and double vision.  Respiratory: Positive for shortness of breath.   Cardiovascular: Positive for chest pain, palpitations and leg swelling.  Gastrointestinal: Positive for constipation. Negative for heartburn, nausea and vomiting.       GERD  Genitourinary: Negative for hematuria.       Chronic urinary retention with chronic Foley tube  Musculoskeletal: Positive for joint pain and myalgias.  Skin: Negative for rash.  Psychiatric/Behavioral:       Anxiety  All other systems reviewed and are negative.  Past Medical History:  Diagnosis Date  . Anxiety   . Arthritis    "generalized" (04/04/2017)  . Asthma   . Basal cell carcinoma    "left ear"  . BPH (benign prostatic hyperplasia)    severe; s/p multiple biopsies  . CAD (coronary artery disease)   . Carotid artery occlusion   . Chronic respiratory failure ([place])   . Chronic rhinitis   . COPD (chronic obstructive pulmonary disease) (HCC)    2L [place] O2  . Diastolic heart failure ([place]) 2019  . Dilation of biliary tract   . Elevated troponin 03/09/2017  . Gallstones   .  GERD (gastroesophageal reflux disease)   . History of blood transfusion    "w/his CABG" (04/04/2017)  . History of kidney stones   . Hyperlipidemia   . Hypertension   . On home oxygen therapy    "~ 24/7" (04/04/2017)  . Peripheral vascular  disease ([place])   . Pneumonia 2019  . Squamous cell carcinoma of skin 04/23/2013   in situ-Right flank (txpbx)  . Squamous cell carcinoma of skin 01/23/2017   [place]/ in siut- Left ear rim (CX3FU)  . Syncope and collapse   . Ureteral tumor 08/2015   had endoscopic procedure for evaluation, unable to reach for biopsy  . Urinary catheter (Foley) change required    Past Surgical History:  Procedure Laterality Date  . ABDOMINAL AORTOGRAM W/LOWER EXTREMITY Bilateral 06/30/2019   Procedure: ABDOMINAL AORTOGRAM W/LOWER EXTREMITY;  Surgeon: Waynetta Sandy, MD;  Location: [facility];  Service: Cardiovascular;  Laterality: Bilateral;  . AMPUTATION Right 08/11/2019   Procedure: RIGHT ABOVE KNEE AMPUTATION;  Surgeon: Angelia Mould, MD;  Location: [facility];  Service: Vascular;  Laterality: Right;  . AMPUTATION Right 01/27/2020   Procedure: RIGHT AMPUTATION ABOVE KNEE REVISION;  Surgeon: Waynetta Sandy, MD;  Location: [facility];  Service: Vascular;  Laterality: Right;  . BASAL CELL CARCINOMA EXCISION Left    ear  . CARDIAC CATHETERIZATION     "prior to bypass"  . CATARACT EXTRACTION W/ INTRAOCULAR LENS  IMPLANT, BILATERAL Bilateral   . CORONARY ARTERY BYPASS GRAFT  10/2001   LIMA to LAD, SVG to OM1-2, SVG to RCA and PDA  . LITHOTRIPSY    . PROSTATE BIOPSY  Oct. 2014  . TONSILLECTOMY     Family History  Problem Relation Age of Onset  . Heart disease Mother   . Heart disease Father        Before age 74  . Breast cancer Mother   . Cancer Mother        Breast cancer  . Hypertension Mother   . Other Mother        AAA  and   Amputation  . Heart attack Father   . Stroke Brother        x3, still living   . Peripheral vascular disease Brother   . Heart disease Brother   . Hyperlipidemia Brother   . Hypertension Brother   . Heart disease Brother   . Allergies Brother    Social History:  reports that he quit smoking about 37 years ago. His smoking use included  cigarettes. He has a 66.00 pack-year smoking history. He has never used smokeless tobacco. He reports current alcohol use. He reports that he does not use drugs. Allergies:  Allergies  Allergen Reactions  . Cefdinir Diarrhea and Other (See Comments)    Severe Diarrhea  . Nitrofurantoin Swelling and Other (See Comments)    Hand Swelling  . Sulfa Antibiotics Anaphylaxis and Swelling  . Sulfonamide Derivatives Swelling and Other (See Comments)    Facial/tongue swelling  . Tape Other (See Comments)    SKIN IS VERY THIN AND TEARS EASILY!!!!! Please do NOT use "plastic" tape- USE PAPER!!  . Ciprofloxacin Itching, Rash and Other (See Comments)    Red itchy hands  . Nitrofurantoin Swelling and Other (See Comments)    Swollen hands  . Amoxicillin Er Other (See Comments)    Frequest urination. Pt reports "not really" allergic 01/24/20  . Doxycycline Other (See Comments)    "Felt terrible" Pt wife reports "not  really" allergic 01/24/20  . Levaquin [Levofloxacin] Itching and Rash  . Sertraline Anxiety and Other (See Comments)    Makes the patient jittery   Medications Prior to Admission  Medication Sig Dispense Refill  . acetaminophen (TYLENOL) 500 MG tablet Take 500 mg by mouth every 6 (six) hours as needed.    . ALPRAZolam (XANAX) 0.5 MG tablet Take 1 tablet (0.5 mg total) by mouth 3 (three) times daily. (Patient taking differently: Take 0.5 mg by mouth 3 (three) times daily as needed.) 90 tablet 2  . aspirin 81 MG tablet Take 81 mg by mouth daily.    Marland Kitchen atorvastatin (LIPITOR) 20 MG tablet TAKE 1 TABLET BY MOUTH EVERY DAY (Patient taking differently: Take 20 mg by mouth at bedtime.) 90 tablet 2  . calcium carbonate (TUMS - DOSED IN MG ELEMENTAL CALCIUM) 500 MG chewable tablet Chew 1 tablet (200 mg of elemental calcium total) by mouth 2 (two) times daily as needed for indigestion or heartburn.    . Cholecalciferol (VITAMIN D3) 2000 units TABS Take 2,000 Units by mouth daily with lunch.     .  famotidine (PEPCID) 20 MG tablet Take 20 mg by mouth at bedtime as needed.    . finasteride (PROSCAR) 5 MG tablet Take 5 mg by mouth daily with lunch.     . fluticasone (FLONASE) 50 MCG/ACT nasal spray Place 2 sprays into both nostrils daily.    . furosemide (LASIX) 20 MG tablet Take two tablets by mouth every morning.  Take one tablet by mouth every evening. 270 tablet 3  . levalbuterol (XOPENEX HFA) 45 MCG/ACT inhaler Inhale 2 puffs into the lungs every 4 (four) hours as needed for wheezing or shortness of breath. 15 g 5  . levalbuterol (XOPENEX) 0.63 MG/3ML nebulizer solution Take 3 mLs (0.63 mg total) by nebulization every 4 (four) hours as needed for wheezing or shortness of breath. 120 mL 3  . levothyroxine (SYNTHROID) 50 MCG tablet Take 50 mcg by mouth daily.    . metoprolol succinate (TOPROL-XL) 25 MG 24 hr tablet Take 0.5 tablets (12.5 mg total) by mouth daily. 45 tablet 3  . Multiple Vitamins-Minerals (PRESERVISION AREDS 2) CAPS Take 1 capsule by mouth daily.    Marland Kitchen NITROSTAT 0.4 MG SL tablet Place 0.4 mg under the tongue every 5 (five) minutes as needed for chest pain.    . OXYGEN Inhale 2 L/min into the lungs continuous.     . pantoprazole (PROTONIX) 40 MG tablet TAKE 1 TABLET(40 MG) BY MOUTH DAILY 30 TO 60 MINUTES BEFORE FIRST MEAL OF THE DAY (Patient taking differently: Take 40 mg by mouth daily.) 30 tablet 3  . Polyethyl Glycol-Propyl Glycol (SYSTANE) 0.4-0.3 % SOLN Apply 1 drop to eye 2 (two) times daily as needed (eye irritation).  0  . potassium chloride 20 MEQ/15ML (10%) SOLN Take 20 mEq by mouth daily.    . predniSONE (DELTASONE) 10 MG tablet FOR FLARE OF COUGH/WHEEZING MAY INCREASE TO 2 DAILY BETTER, THAN 1 TABLET BY MOUTH EVERY DAY (Patient taking differently: Take 10 mg by mouth daily. If flare occurs, take 20 mg until better.) 100 tablet 2  . spironolactone (ALDACTONE) 25 MG tablet Take 25 mg by mouth daily.    . vitamin B-12 (CYANOCOBALAMIN) 1000 MCG tablet Take 1,000 mcg by  mouth daily.     . cephALEXin (KEFLEX) 500 MG capsule Take 1 capsule (500 mg total) by mouth 4 (four) times daily. (Patient not taking: No sig reported) 28 capsule 0  .  doxycycline (VIBRAMYCIN) 100 MG capsule Take 1 capsule (100 mg total) by mouth 2 (two) times daily. One po bid x 7 days (Patient not taking: No sig reported) 14 capsule 0  . polyethylene glycol (MIRALAX / GLYCOLAX) 17 g packet Take 17 g by mouth daily. (Patient not taking: No sig reported) 14 each 0  . sodium chloride (MURO 128) 5 % ophthalmic solution Place 1 drop into both eyes 4 (four) times daily. 15 mL   . Tiotropium Bromide Monohydrate (SPIRIVA RESPIMAT) 2.5 MCG/ACT AERS INHALE 2 PUFFS BY MOUTH EVERY DAY (Patient taking differently: Inhale 2 puffs into the lungs daily.) 4 g 11  . Zinc Oxide (TRIPLE PASTE) 12.8 % ointment Apply topically as needed for irritation. To gluteal cleft/buttocks (Patient not taking: No sig reported) 56.7 g 0    Drug Regimen Review Drug regimen was reviewed and remains appropriate with no significant issues identified  Home: Home Living Family/patient expects to be discharged to:: Private residence Living Arrangements: Spouse/significant other Available Help at Discharge: Family,Available 24 hours/day Type of Home: House Home Access: Level entry Home Layout: Multi-level,Bed/bath upstairs Alternate Level Stairs-Number of Steps: Enters from carport with level entry. 9 to kitchen/dining room/living room. 6 additional steps to bedroom/bathroom with 3 bedrooms and 2 bathrooms with 1 rail with each set of stairs. Alternate Level Stairs-Rails: Right Bathroom Shower/Tub: Walk-in shower,Tub/shower unit Bathroom Toilet: Standard Bathroom Accessibility: Yes Home Equipment: Walker - 2 wheels,Cane - single point,Shower seat,Transport chair,Tub bench Additional Comments: O2 2L, stair lift   Functional History: Prior Function Level of Independence: Needs assistance Gait / Transfers Assistance Needed:  independent with transfers to the w/c, uses RW when working with PT -walking 10 steps with chair follow; independent with w/c mobility. has w/c on 2 different floors. 2 falls on thanksgiving and xmas ADL's / Homemaking Assistance Needed: wife has been assiting with LB bathing and dressing Comments: Has been working with PT for HHPT  Functional Status:  Mobility: Bed Mobility Overal bed mobility: Needs Assistance Bed Mobility: Sit to Supine Rolling: Mod assist Supine to sit: Min guard,HOB elevated Sit to supine: Min guard,HOB elevated General bed mobility comments: received sitting EOB finishing dinner. Transfers Overall transfer level: Needs assistance Equipment used: 1 person hand held assist,Rolling walker (2 wheeled) Transfers: Lateral/Scoot Transfers Sit to Stand: Mod assist Stand pivot transfers: Min guard Squat pivot transfers: Mod assist  Lateral/Scoot Transfers: Min assist General transfer comment: along EOB toward HOB to simulate slide board transfer. pt able to lift hips with BUE support to scoot hips fairly well, minA at times for transfer pad assist; pt deferring to stand due to heartburn/just ate and mild nausea Ambulation/Gait General Gait Details: deferred due to lethargy and need for assist for squat pivot    ADL: ADL Overall ADL's : Needs assistance/impaired Eating/Feeding: Set up,Independent,Sitting Grooming: Wash/dry hands,Wash/dry face,Brushing hair,Min guard,Sitting Upper Body Bathing: Min guard,Sitting Lower Body Bathing: Maximal assistance,Sitting/lateral leans Upper Body Dressing : Min guard,Sitting Lower Body Dressing: Maximal assistance,Sitting/lateral leans Toilet Transfer: Moderate assistance,RW,Stand-pivot,Cueing for safety Toilet Transfer Details (indicate cue type and reason): simulated Toileting- Clothing Manipulation and Hygiene: Total assistance Functional mobility during ADLs: Moderate assistance,Cueing for safety,Rolling  walker  Cognition: Cognition Overall Cognitive Status: Impaired/Different from baseline Orientation Level: Oriented X4 Cognition Arousal/Alertness: Awake/alert Behavior During Therapy: WFL for tasks assessed/performed Overall Cognitive Status: Impaired/Different from baseline Area of Impairment: Problem solving,Attention,Awareness Current Attention Level: Selective Following Commands: Follows one step commands consistently,Follows one step commands with increased time Awareness: Emergent Problem Solving: Requires verbal  cues General Comments: [facility] for simple tasks. Apparent slowed processing at times, potentially exacerbated by fatigue and HOH. Pt stating "I can't" before trying to attempt mobility  Physical Exam: Blood pressure (!) 103/54, pulse 74, temperature (!) 97.5 F (36.4 C), temperature source Oral, resp. rate 18, height _0  (1.727 m), weight 52.7 kg, SpO2 98 %. Physical Exam Skin:    Comments: Surgical dressing sites in place.  Sacral decubitus not examined as patient was sitting up in chair  Neurological:     Comments: Patient is alert frail in no acute distress.  Oriented to person and place. Poor medical historian     Results for orders placed or performed during the hospital encounter of 01/23/20 (from the past 48 hour(s))  CBC     Status: Abnormal   Collection Time: 02/04/20  1:29 AM  Result Value Ref Range   WBC 7.7 4.0 - 10.5 K/uL   RBC 2.69 (L) 4.22 - 5.81 MIL/uL   Hemoglobin 8.5 (L) 13.0 - 17.0 g/dL   HCT 25.3 (L) 39.0 - 52.0 %   MCV 94.1 80.0 - 100.0 fL   MCH 31.6 26.0 - 34.0 pg   MCHC 33.6 30.0 - 36.0 g/dL   RDW 13.5 11.5 - 15.5 %   Platelets 222 150 - 400 K/uL   nRBC 0.0 0.0 - 0.2 %    Comment: Performed at [facility], [place] [address], [place], [place] [address]  Basic metabolic panel     Status: Abnormal   Collection Time: 02/04/20  1:29 AM  Result Value Ref Range   Sodium 126 (L) 135 - 145 mmol/L   Potassium 4.1 3.5 - 5.1 mmol/L    Chloride 88 (L) 98 - 111 mmol/L   CO2 30 22 - 32 mmol/L   Glucose, Bld 85 70 - 99 mg/dL    Comment: Glucose reference range applies only to samples taken after fasting for at least 8 hours.   BUN 18 8 - 23 mg/dL   Creatinine, Ser 1.02 0.61 - 1.24 mg/dL   Calcium 8.7 (L) 8.9 - 10.3 mg/dL   GFR, Estimated >60 >60 mL/min    Comment: (NOTE) Calculated using the CKD-EPI Creatinine Equation (2021)    Anion gap 8 5 - 15    Comment: Performed at [facility] [address] [place], [place] [address]  SARS CORONAVIRUS 2 (TAT 6-24 HRS) Nasopharyngeal Nasopharyngeal Swab     Status: None   Collection Time: 02/04/20 11:32 AM   Specimen: Nasopharyngeal Swab  Result Value Ref Range   SARS Coronavirus 2 NEGATIVE NEGATIVE    Comment: (NOTE) SARS-CoV-2 target nucleic acids are NOT DETECTED.  The SARS-CoV-2 RNA is generally detectable in upper and lower respiratory specimens during the acute phase of infection. Negative results do not preclude SARS-CoV-2 infection, do not rule out co-infections with other pathogens, and should not be used as the sole basis for treatment or other patient management decisions. Negative results must be combined with clinical observations, patient history, and epidemiological information. The expected result is Negative.  Fact Sheet for Patients: SugarRoll.be  Fact Sheet for Healthcare Providers: https://www.woods-mathews.com/  This test is not yet approved or cleared by the [place] FDA and  has been authorized for detection and/or diagnosis of SARS-CoV-2 by FDA under an Emergency Use Authorization (EUA). This EUA will remain  in effect (meaning this test can be used) for the duration of the COVID-19 declaration under Se ction 564(b)(1) of the Act, 21  U.S.C. section 360bbb-3(b)(1), unless the authorization is terminated or revoked sooner.  Performed at [facility], [place] [address], [place],  [place] [address]   CBC     Status: Abnormal   Collection Time: 02/05/20 12:54 AM  Result Value Ref Range   WBC 7.7 4.0 - 10.5 K/uL   RBC 2.76 (L) 4.22 - 5.81 MIL/uL   Hemoglobin 8.8 (L) 13.0 - 17.0 g/dL   HCT 25.9 (L) 39.0 - 52.0 %   MCV 93.8 80.0 - 100.0 fL   MCH 31.9 26.0 - 34.0 pg   MCHC 34.0 30.0 - 36.0 g/dL   RDW 13.4 11.5 - 15.5 %   Platelets 246 150 - 400 K/uL   nRBC 0.0 0.0 - 0.2 %    Comment: Performed at [facility], [place] [address], [place], [place] [address]  Basic metabolic panel     Status: Abnormal   Collection Time: 02/05/20 12:54 AM  Result Value Ref Range   Sodium 127 (L) 135 - 145 mmol/L   Potassium 4.4 3.5 - 5.1 mmol/L   Chloride 90 (L) 98 - 111 mmol/L   CO2 27 22 - 32 mmol/L   Glucose, Bld 99 70 - 99 mg/dL    Comment: Glucose reference range applies only to samples taken after fasting for at least 8 hours.   BUN 15 8 - 23 mg/dL   Creatinine, Ser 0.93 0.61 - 1.24 mg/dL   Calcium 8.6 (L) 8.9 - 10.3 mg/dL   GFR, Estimated >60 >60 mL/min    Comment: (NOTE) Calculated using the CKD-EPI Creatinine Equation (2021)    Anion gap 10 5 - 15    Comment: Performed at [facility] [address] [place], [place] [address]   *Note: Due to a large number of results and/or encounters for the requested time period, some results have not been displayed. A complete set of results can be found in Results Review.   No results found.     Medical Problem List and Plan: 1.  Decreased functional mobility secondary to cellulitis left foot as well as right AKA revision 01/27/2020  -patient may *** shower  -ELOS/Goals: *** 2.  Antithrombotics: -DVT/anticoagulation:Lovenox    -antiplatelet therapy: Aspirin 81 mg daily 3. Pain Management: Hydrocodone as needed 4. Mood: Provide emotional support  -antipsychotic agents: N/A 5. Neuropsych: This patient is capable of making decisions on his own behalf. 6. Skin/Wound decubitus to coccyx.  Skin care as directed.  Routine  skin checks 7. Fluids/Electrolytes/Nutrition: Routine in and outs with follow-up chemistries 8.  ID/cellulitis left foot.  MRSA screen negative.  Blood cultures no growth to date.  Currently completing course of Augmentin 02/05/2020 9.  Atrial flutter/A. fib with RVR.  Toprol 25 mg daily.  Not a candidate for long-term DOAC.  30-day event monitor as outpatient.  Follow-up cardiology services 10.  Hyponatremia.  Lasix and Aldactone currently on hold.  Resume as needed.  Currently maintained on sodium chloride tablets 1 g twice daily 11.  Chronic diastolic congestive heart failure.  Lasix and Aldactone currently on hold followed by cardiology services.  Monitor for any signs of fluid overload 12.  Chronic respiratory failure with hypoxia/COPD.  Chronic oxygen therapy 2 L.  Continue chronic prednisone. 13.  Chronic urinary retention.  Chronic Foley tube followed by urology service [name].  Continue Myrbetriq as well as Proscar.  Change Foley catheter to routinely.DO NOT REMOVE FOLEY 14.  GERD.  Protonix 15.  Chronic sacral  decubitus.  Santyl ointment twice daily. 16.  Hypothyroidism.  Synthroid 17.  Normocytic anemia.  Transfused 1 unit packed red blood cells 01/31/2020.  Follow-up CBC ***  Cathlyn Parsons, PA-C 02/05/2020

## 2020-02-04 NOTE — Progress Notes (Signed)
Inpatient Rehab Admissions:  Inpatient Rehab Consult received.  I spoke to the wife over the phone for rehabilitation assessment and to discuss goals and expectations of an inpatient rehab admission.  The patient and family are all hopeful for CIR admission following pts recent AKA revision.  Note pt with covid test ordered for today.  Will await results, and if negative, can admit as early as today.  Will continue to follow.   Signed: Shann Medal, PT, DPT Admissions Coordinator [phone] 02/04/20  12:45 PM

## 2020-02-04 NOTE — Progress Notes (Signed)
Breitenstein, Albaraa [name] ([id number]) Visit Report for 01/20/2020 Arrival Information Details Patient Name: Date of Service: DO RN, [place] V ID L. 01/20/2020 2:00 PM Medical Record Number: [id number] Patient Account Number: [id number] Date of Birth/Sex: Treating RN: 11-09-1929 (87 y.o. [name], [name] Primary Care Mathieu Schloemer: [name] Other Clinician: Referring Phuong Moffatt: Treating Besnik Febus/Extender: [name] in Treatment: 10 Visit Information History Since Last Visit Added or deleted any medications: No Patient Arrived: Wheel Chair Any new allergies or adverse reactions: No Arrival Time: 15:00 Had a fall or experienced change in No Accompanied By: wife activities of daily living that may affect Transfer Assistance: Manual risk of falls: Patient Identification Verified: Yes Signs or symptoms of abuse/neglect since last visito No Secondary Verification Process Completed: Yes Hospitalized since last visit: No Patient Requires Transmission-Based Precautions: No Implantable device outside of the clinic excluding No Patient Has Alerts: Yes cellular tissue based products placed in the center Patient Alerts: L ABI N/C, TBI = .20 since last visit: Has Dressing in Place as Prescribed: Yes Pain Present Now: Yes Electronic Signature(s) Signed: 01/27/2020 9:18:12 AM By: [name] Entered By: [name] on 01/20/2020 15:00:36 -------------------------------------------------------------------------------- Encounter Discharge Information Details Patient Name: Date of Service: DO RN, [name] 01/20/2020 2:00 PM Medical Record Number: [id number] Patient Account Number: [id number] Date of Birth/Sex: Treating RN: August 09, 1929 (87 y.o. [name] Primary Care Carisa Backhaus: [name] Other Clinician: Referring Jazzlyn Huizenga: Treating Azam Gervasi/Extender: [name] in Treatment: 10 Encounter Discharge Information Items Post Procedure  Vitals Discharge Condition: Stable Temperature (F): 97.7 Ambulatory Status: Wheelchair Pulse (bpm): 102 Discharge Destination: Home Respiratory Rate (breaths/min): 18 Transportation: Private Auto Blood Pressure (mmHg): 149/72 Accompanied By: wife Schedule Follow-up Appointment: Yes Clinical Summary of Care: Patient Declined Electronic Signature(s) Signed: 01/22/2020 5:28:50 PM By: Levan Hurst RN, BSN Entered By: Levan Hurst on 01/20/2020 17:56:03 -------------------------------------------------------------------------------- Lower Extremity Assessment Details Patient Name: Date of Service: DO RN, [name] 01/20/2020 2:00 PM Medical Record Number: [id number] Patient Account Number: [id number] Date of Birth/Sex: Treating RN: [birth year] (87 y.o. [name] Primary Care Corryn Madewell: [name] Other Clinician: Referring Taiyo Kozma: Treating Lorely Bubb/Extender: [name] in Treatment: 10 Electronic Signature(s) Signed: 01/20/2020 4:33:09 PM By: Mikeal Hawthorne EMT/HBOT/SD Signed: 01/20/2020 5:35:07 PM By: Rhae Hammock RN Entered By: Mikeal Hawthorne on 01/20/2020 15:12:59 -------------------------------------------------------------------------------- Multi Wound Chart Details Patient Name: Date of Service: DO RN, [name] 01/20/2020 2:00 PM Medical Record Number: [id number] Patient Account Number: [id number] Date of Birth/Sex: Treating RN: 02-Jun-1929 (87 y.o. [name], [name] Primary Care Toleen Lachapelle: [name] Other Clinician: Referring Anyia Gierke: Treating Taijuan Serviss/Extender: [name] in Treatment: 10 Vital Signs Height(in): 62 Pulse(bpm): 102 Weight(lbs): Blood Pressure(mmHg): 149/72 Body Mass Index(BMI): Temperature(F): 97.7 Respiratory Rate(breaths/min): 18 Photos: [15:No Photos Sacrum] [16:No Photos Left, Proximal Upper Arm] [17:No Photos Left, Distal Upper Arm] Wound Location: [15:Pressure  Injury] [16:Trauma] [17:Trauma] Wounding Event: [15:Pressure Ulcer] [16:Trauma, Other] [17:Trauma, Other] Primary Etiology: [15:Cataracts, Asthma, Chronic] [16:Cataracts, Asthma, Chronic] [17:Cataracts, Asthma, Chronic] Comorbid History: [15:Obstructive Pulmonary Disease (COPD), Congestive Heart Failure, Coronary Artery Disease, Hypertension, Peripheral Arterial Disease, Osteoarthritis, Confinement Anxiety 08/01/2019] [16:Obstructive Pulmonary Disease (COPD), Congestive  Heart Failure, Coronary Artery Disease, Hypertension, Peripheral Arterial Disease, Osteoarthritis, Confinement Anxiety 01/04/2020] [17:Obstructive Pulmonary Disease (COPD), Congestive Heart Failure, Coronary Artery Disease, Hypertension, Peripheral  Arterial Disease, Osteoarthritis, Confinement Anxiety 01/04/2020] Date Acquired: [15:10] [16:2] [17:2] Weeks of Treatment: [15:Open] [16:Open] [17:Open] Wound Status: [15:0.5x0.3x0.2] [16:9x0.3x0.1] [17:0.4x0.4x0.1] Measurements L x W x D (  cm) [15:0.118] [16:2.121] [17:0.126] A (cm) : rea [15:0.024] [16:0.212] [17:0.013] Volume (cm) : [15:46.40%] [16:74.30%] [17:96.20%] % Reduction in A rea: [15:-9.10%] [16:74.30%] [17:96.10%] % Reduction in Volume: [15:4] Starting Position 1 (o'clock): [15:11] Ending Position 1 (o'clock): [15:0.2] Maximum Distance 1 (cm): [15:Yes] [16:No] [17:No] Undermining: [15:Category/Stage III] [16:Full Thickness Without Exposed] [17:Full Thickness Without Exposed] Classification: [15:Medium] [16:Support Structures Medium] [17:Support Structures Medium] Exudate Amount: [15:Serous] [16:Serosanguineous] [17:Serosanguineous] Exudate Type: [15:amber] [16:red, brown] [17:red, brown] Exudate Color: [15:Epibole] [16:Distinct, outline attached] [17:Distinct, outline attached] Wound Margin: [15:Small (1-33%)] [16:Large (67-100%)] [17:Large (67-100%)] Granulation Amount: [15:Pink, Pale] [16:Red] [17:Red] Granulation Quality: [15:Medium (34-66%)] [16:None Present  (0%)] [17:None Present (0%)] Necrotic Amount: [15:Fat Layer (Subcutaneous Tissue): Yes Fat Layer (Subcutaneous Tissue): Yes Fat Layer (Subcutaneous Tissue): Yes] Exposed Structures: [15:Fascia: No Tendon: No Muscle: No Joint: No Bone: No Small (1-33%)] [16:Fascia: No Tendon: No Muscle: No Joint: No Bone: No None] [17:Fascia: No Tendon: No Muscle: No Joint: No Bone: No None] Epithelialization: [15:Debridement - Excisional] [16:N/A] [17:N/A] Debridement: Pre-procedure Verification/Time Out 15:45 [16:N/A] [17:N/A] Taken: [15:Lidocaine] [16:N/A] [17:N/A] Pain Control: [15:Subcutaneous] [16:N/A] [17:N/A] Tissue Debrided: [15:Skin/Subcutaneous Tissue] [16:N/A] [17:N/A] Level: [15:0.15] [16:N/A] [17:N/A] Debridement A (sq cm): [15:rea Curette] [16:N/A] [17:N/A] Instrument: [15:Minimum] [16:N/A] [17:N/A] Bleeding: [15:Pressure] [16:N/A] [17:N/A] Hemostasis A chieved: [15:0] [16:N/A] [17:N/A] Procedural Pain: [15:0] [16:N/A] [17:N/A] Post Procedural Pain: [15:Procedure was tolerated well] [16:N/A] [17:N/A] Debridement Treatment Response: [15:0.5x0.3x0.2] [16:N/A] [17:N/A] Post Debridement Measurements L x W x D (cm) [15:0.024] [16:N/A] [17:N/A] Post Debridement Volume: (cm) [15:Category/Stage III] [16:N/A] [17:N/A] Post Debridement Stage: [15:Debridement] [16:N/A] [17:N/A] Treatment Notes Electronic Signature(s) Signed: 01/20/2020 5:07:05 PM By: [name] MD Signed: 01/20/2020 5:35:07 PM By: Rhae Hammock RN Entered By: [name] on 01/20/2020 16:24:27 -------------------------------------------------------------------------------- Multi-Disciplinary Care Plan Details Patient Name: Date of Service: DO RN, [name] 01/20/2020 2:00 PM Medical Record Number: [id number] Patient Account Number: [id number] Date of Birth/Sex: Treating RN: 1929/01/27 (87 y.o. [name], [name] Primary Care Swannie Milius: [name] Other Clinician: Referring Brisha Mccabe: Treating Riyana Biel/Extender:  [name] in Treatment: 10 Active Inactive Wound/Skin Impairment Nursing Diagnoses: Knowledge deficit related to ulceration/compromised skin integrity Goals: Patient/caregiver will verbalize understanding of skin care regimen Date Initiated: 11/11/2019 Target Resolution Date: 02/07/2020 Goal Status: Active Ulcer/skin breakdown will have a volume reduction of 30% by week 4 Date Initiated: 11/11/2019 Target Resolution Date: 02/07/2020 Goal Status: Active Interventions: Assess patient/caregiver ability to obtain necessary supplies Assess patient/caregiver ability to perform ulcer/skin care regimen upon admission and as needed Assess ulceration(s) every visit Notes: Electronic Signature(s) Signed: 01/20/2020 5:35:07 PM By: Rhae Hammock RN Entered By: Rhae Hammock on 01/20/2020 14:51:43 -------------------------------------------------------------------------------- Pain Assessment Details Patient Name: Date of Service: DO RN, [name] 01/20/2020 2:00 PM Medical Record Number: [id number] Patient Account Number: [id number] Date of Birth/Sex: Treating RN: 22-Dec-1929 (86 y.o. [name] Primary Care Wilbert Schouten: [name] Other Clinician: Referring Blanka Rockholt: Treating Denelda Akerley/Extender: [name] in Treatment: 10 Active Problems Location of Pain Severity and Description of Pain Patient Has Paino Yes Site Locations Rate the pain. Current Pain Level: 5 Pain Management and Medication Current Pain Management: Electronic Signature(s) Signed: 01/20/2020 5:35:07 PM By: Rhae Hammock RN Signed: 01/27/2020 9:18:12 AM By: [name] Entered By: [name] on 01/20/2020 15:01:32 -------------------------------------------------------------------------------- Patient/Caregiver Education Details Patient Name: Date of Service: DO RN, [name] 1/11/2022andnbsp2:00 PM Medical Record Number:  [id number] Patient Account Number: [id number] Date of Birth/Gender: Treating RN: [birth year] (87 y.o. [name] Primary Care Physician: [name] Other  Clinician: Referring Physician: Treating Physician/Extender: [name] in Treatment: 10 Education Assessment Education Provided To: Patient Education Topics Provided Wound/Skin Impairment: Methods: Explain/Verbal Responses: State content correctly Electronic Signature(s) Signed: 01/20/2020 5:35:07 PM By: Rhae Hammock RN Entered By: Rhae Hammock on 01/20/2020 14:52:03 -------------------------------------------------------------------------------- Wound Assessment Details Patient Name: Date of Service: DO RN, [name] 01/20/2020 2:00 PM Medical Record Number: [id number] Patient Account Number: [id number] Date of Birth/Sex: Treating RN: 1929/05/24 (87 y.o. [name], [name] Primary Care Vira Chaplin: [name] Other Clinician: Referring Tinnie Kunin: Treating Duriel Deery/Extender: [name] in Treatment: 10 Wound Status Wound Number: 15 Primary Pressure Ulcer Etiology: Wound Location: Sacrum Wound Open Wounding Event: Pressure Injury Status: Date Acquired: 08/01/2019 Comorbid Cataracts, Asthma, Chronic Obstructive Pulmonary Disease Weeks Of Treatment: 10 History: (COPD), Congestive Heart Failure, Coronary Artery Disease, Clustered Wound: No Hypertension, Peripheral Arterial Disease, Osteoarthritis, Confinement Anxiety Photos Photo Uploaded By: Mikeal Hawthorne on 01/22/2020 13:12:41 Wound Measurements Length: (cm) 0.5 Width: (cm) 0.3 Depth: (cm) 0.2 Area: (cm) 0.118 Volume: (cm) 0.024 % Reduction in Area: 46.4% % Reduction in Volume: -9.1% Epithelialization: Small (1-33%) Tunneling: No Undermining: Yes Starting Position (o'clock): 4 Ending Position (o'clock): 11 Maximum Distance: (cm) 0.2 Wound Description Classification: Category/Stage  III Wound Margin: Epibole Exudate Amount: Medium Exudate Type: Serous Exudate Color: amber Foul Odor After Cleansing: No Slough/Fibrino Yes Wound Bed Granulation Amount: Small (1-33%) Exposed Structure Granulation Quality: Pink, Pale Fascia Exposed: No Necrotic Amount: Medium (34-66%) Fat Layer (Subcutaneous Tissue) Exposed: Yes Necrotic Quality: Adherent Slough Tendon Exposed: No Muscle Exposed: No Joint Exposed: No Bone Exposed: No Treatment Notes Wound #15 (Sacrum) Cleanser Wound Cleanser Discharge Instruction: Cleanse the wound with wound cleanser prior to applying a clean dressing using gauze sponges, not tissue or cotton balls. Peri-Wound Care Skin Prep Discharge Instruction: Use skin prep as directed Topical Primary Dressing Santyl Ointment Discharge Instruction: Apply nickel thick amount to wound bed as instructed Secondary Dressing ComfortFoam Border, 3x3 in (silicone border) Discharge Instruction: Apply over primary dressing as directed. Secured With Compression Wrap Compression Stockings Environmental education officer) Signed: 01/20/2020 4:33:09 PM By: Mikeal Hawthorne EMT/HBOT/SD Signed: 01/20/2020 5:35:07 PM By: Rhae Hammock RN Entered By: Mikeal Hawthorne on 01/20/2020 15:15:42 -------------------------------------------------------------------------------- Wound Assessment Details Patient Name: Date of Service: DO RN, [name] 01/20/2020 2:00 PM Medical Record Number: [id number] Patient Account Number: [id number] Date of Birth/Sex: Treating RN: [birth year] (87 y.o. [name], [name] Primary Care Caelyn Route: [name] Other Clinician: Referring Porshe Fleagle: Treating Alassane Kalafut/Extender: [name] in Treatment: 10 Wound Status Wound Number: 16 Primary Trauma, Other Etiology: Wound Location: Left, Proximal Upper Arm Wound Open Wounding Event: Trauma Status: Date Acquired: 01/04/2020 Comorbid Cataracts, Asthma, Chronic  Obstructive Pulmonary Disease Weeks Of Treatment: 2 History: (COPD), Congestive Heart Failure, Coronary Artery Disease, Clustered Wound: No Hypertension, Peripheral Arterial Disease, Osteoarthritis, Confinement Anxiety Photos Wound Measurements Length: (cm) 9 Width: (cm) 0.3 Depth: (cm) 0.1 Area: (cm) 2.121 Volume: (cm) 0.212 % Reduction in Area: 74.3% % Reduction in Volume: 74.3% Epithelialization: None Tunneling: No Undermining: No Wound Description Classification: Full Thickness Without Exposed Support Structures Wound Margin: Distinct, outline attached Exudate Amount: Medium Exudate Type: Serosanguineous Exudate Color: red, brown Foul Odor After Cleansing: No Slough/Fibrino No Wound Bed Granulation Amount: Large (67-100%) Exposed Structure Granulation Quality: Red Fascia Exposed: No Necrotic Amount: None Present (0%) Fat Layer (Subcutaneous Tissue) Exposed: Yes Tendon Exposed: No Muscle Exposed: No Joint Exposed: No Bone Exposed: No Treatment Notes Wound #16 (Upper Arm) Wound Laterality:  Left, Proximal Cleanser Normal Saline Discharge Instruction: Cleanse the wound with Normal Saline prior to applying a clean dressing using gauze sponges, not tissue or cotton balls. Peri-Wound Care Topical Primary Dressing Xeroform Occlusive Gauze Dressing, 4x4 in Discharge Instruction: Apply to wound bed as instructed Secondary Dressing Woven Gauze Sponge, Non-Sterile 4x4 in Discharge Instruction: Apply over primary dressing as directed. Secured With [employer], 4.5x3.1 (in/yd) Discharge Instruction: Secure with Kerlix as directed. Paper Tape, 2x10 (in/yd) Discharge Instruction: Secure dressing with tape as directed. Compression Wrap Compression Stockings Add-Ons Electronic Signature(s) Signed: 01/22/2020 4:03:30 PM By: Mikeal Hawthorne EMT/HBOT/SD Signed: 02/04/2020 5:56:51 PM By: Rhae Hammock RN Previous Signature: 01/20/2020 4:33:09 PM Version By: Mikeal Hawthorne EMT/HBOT/SD Previous Signature: 01/20/2020 5:35:07 PM Version By: Rhae Hammock RN Entered By: Mikeal Hawthorne on 01/22/2020 13:26:48 -------------------------------------------------------------------------------- Wound Assessment Details Patient Name: Date of Service: DO RN, [name] 01/20/2020 2:00 PM Medical Record Number: [id number] Patient Account Number: [id number] Date of Birth/Sex: Treating RN: May 09, 1929 (87 y.o. [name], [place] Primary Care Jadalynn Burr: [name] Other Clinician: Referring Goble Fudala: Treating Yaphet Smethurst/Extender: [name] in Treatment: 10 Wound Status Wound Number: 17 Primary Trauma, Other Etiology: Wound Location: Left, Distal Upper Arm Wound Open Wounding Event: Trauma Status: Date Acquired: 01/04/2020 Comorbid Cataracts, Asthma, Chronic Obstructive Pulmonary Disease Weeks Of Treatment: 2 History: (COPD), Congestive Heart Failure, Coronary Artery Disease, Clustered Wound: No Hypertension, Peripheral Arterial Disease, Osteoarthritis, Confinement Anxiety Photos Photo Uploaded By: Mikeal Hawthorne on 01/22/2020 13:11:44 Wound Measurements Length: (cm) 0.4 Width: (cm) 0.4 Depth: (cm) 0.1 Area: (cm) 0.126 Volume: (cm) 0.013 % Reduction in Area: 96.2% % Reduction in Volume: 96.1% Epithelialization: None Tunneling: No Undermining: No Wound Description Classification: Full Thickness Without Exposed Support Structures Wound Margin: Distinct, outline attached Exudate Amount: Medium Exudate Type: Serosanguineous Exudate Color: red, brown Foul Odor After Cleansing: No Slough/Fibrino No Wound Bed Granulation Amount: Large (67-100%) Exposed Structure Granulation Quality: Red Fascia Exposed: No Necrotic Amount: None Present (0%) Fat Layer (Subcutaneous Tissue) Exposed: Yes Tendon Exposed: No Muscle Exposed: No Joint Exposed: No Bone Exposed: No Treatment Notes Wound #17 (Upper Arm) Wound Laterality:  Left, Distal Cleanser Normal Saline Discharge Instruction: Cleanse the wound with Normal Saline prior to applying a clean dressing using gauze sponges, not tissue or cotton balls. Peri-Wound Care Topical Primary Dressing Xeroform Occlusive Gauze Dressing, 4x4 in Discharge Instruction: Apply to wound bed as instructed Secondary Dressing Woven Gauze Sponge, Non-Sterile 4x4 in Discharge Instruction: Apply over primary dressing as directed. Secured With [employer], 4.5x3.1 (in/yd) Discharge Instruction: Secure with Kerlix as directed. Paper Tape, 2x10 (in/yd) Discharge Instruction: Secure dressing with tape as directed. Compression Wrap Compression Stockings Add-Ons Electronic Signature(s) Signed: 01/20/2020 4:33:09 PM By: Mikeal Hawthorne EMT/HBOT/SD Signed: 01/20/2020 5:35:07 PM By: Rhae Hammock RN Entered By: Mikeal Hawthorne on 01/20/2020 15:17:33 -------------------------------------------------------------------------------- Vitals Details Patient Name: Date of Service: DO RN, [name] 01/20/2020 2:00 PM Medical Record Number: [id number] Patient Account Number: [id number] Date of Birth/Sex: Treating RN: [birth year] (87 y.o. [name], [name] Primary Care Sophronia Varney: [name] Other Clinician: Referring Aliciana Ricciardi: Treating Kayelyn Lemon/Extender: [name] in Treatment: 10 Vital Signs Time Taken: 15:00 Temperature (F): 97.7 Height (in): 69 Pulse (bpm): 102 Respiratory Rate (breaths/min): 18 Blood Pressure (mmHg): 149/72 Reference Range: 80 - 120 mg / dl Electronic Signature(s) Signed: 01/27/2020 9:18:12 AM By: [name] Entered By: [name] on 01/20/2020 15:01:05

## 2020-02-04 NOTE — Care Management Important Message (Signed)
Important Message  Patient Details  Name: [name] MRN: [id number] Date of Birth: Dec 14, 1929   Medicare Important Message Given:  Yes - Important Message mailed due to current [facility]     Delorse Lek 02/04/2020, 3:16 PM

## 2020-02-04 NOTE — Telephone Encounter (Signed)
Called and spoke with pt and he is aware of refill that has been sent to the pharmacy.   

## 2020-02-04 NOTE — Progress Notes (Addendum)
Maintaining NSR last 36h. Plan 30 day event monitor at DC to screen for recurrent asymptomatic atrial fibrillation. Unless AFib recurs during admission, it appears safer to forego anticoagulation due to his high risk of bleeding and injury. Decline in Na seems to be stabilizing, hopefully reversing. Does not appear hypervolemic. Will hold furosemide and spironolactone for one day, reassess tomorrow. Will continue to monitor from a distance.

## 2020-02-04 NOTE — Progress Notes (Signed)
PROGRESS NOTE    SHELTON SQUARE  DSK:[id number] DOB: [birth year] DOA: 01/23/2020 PCP: [name], MD   Chief Complaint  Patient presents with  . Wound Check   Brief Narrative: 87 year old male with complex comorbidities with hypertension, hyperlipidemia, COPD/chronic hypoxic respiratory failure on 2 L nasal cannula at the lowest trilogy at home, chronic diastolic CHF, status post right AKA presented with left foot pain and redness that did not improve on doxycycline.  He was found to have left foot redness going towards the ankle and will start on IV antibiotics.  Seen by vascular surgery revision surgery right AKA stump on 1/18, per vascular no further need of antibiotic on the right leg. Patient also treated for left lower cellulitis work up w/ MRSA screen negative blood culture no growth complete 7 days of antibiotic but left foot looked worse ESR/CRP slightly elevated, placed on Augmentin 1/20, and left foot appears better WBC normalized on 1/22.  As per vascular patient has declined amputation of the left foot.  Patient had episode of chest pain with mildly positive troponin cardiology was consulted. Seen by PT OT CIR recommended 1/124: Patient had episode of A. fib with RVR cardiology back on board, converted back to normal sinus rhythm.  Subjective: Sitting up on the edge of bed this morning.  Wife is at the bedside.  On home oxygen setting.   Overnight no fever, Lab with sodium slightly improving  Assessment & Plan:  Cellulitis of left foot:MRSA screen negative blood culture no growth completed 7 days of antibiotic but left foot looked worse ESR/CRP slightly elevated, so was placed on Augmentin 1/20, and now left foot still it is improved WBC count normalized CRP normal.  Complete 7 days course of Augmentin through 1/27. Pt would not want amputation if needed and wife states "if he needs amputation on left- he will be hospice".    Right BKA stump wound/PVD: recent right AKA,  seen by vascular surgery revision surgery right AKA stump on 1/18, per vascular no further need of antibiotic on the right leg.  Small area of mild drainage.  Healing well and vascular surgery planning for  follow-up in 2 to 3 weeks for staple removal.  A Flutter/A FIB w/ RVR, new onset-Short episode, seen by cardiology back In NSR-Continue Toprol 25 mg. Not a candidate for long-term DOAC. 30 days event monitor being planned after discharge.  Chest pain with mildly positive troponin/history of CAD status post CABG, cards following.  Suspecting tachycardia related angina.  No acute changes in EKG continue plan as per cardiology.  Chronic diastolic CHF: Euvolemic, monitor volume status closely.  Holding Lasix and Aldactone due to hypo natremia  X 1 day, reassess tomorrow  Chronic respiratory failure with hypoxia/hypercapnia/COPD/obstructive sleep apnea on Triology: Normally on 2l [place] at home.  Continue respiratory support bronchodilators, home steroid andTriology, PT OT eval as tolerate.d  AKI-resolved. Creat 1.0 today  Hyponatremia: Sodium: 128-130.  Slightly one twenty-six on trial of salt tablet but would not continue on d/c due too CHF . Wife endorses patient had similar episodes of hyponatremia when he is sick.  Holding Lasix and Aldactone due to hypo natremia  X 1 day, reassess tomorrow per cardio. Recent Labs  Lab 01/31/20 0225 02/01/20 0238 02/02/20 0511 02/03/20 0339 02/04/20 0129  NA 126* 129* 128* 125* 126*   NSVT: On beta-blocker.  Hyperlipidemia: Continue statin  Normocytic anemia patient with acute drop in hemoglobin as low as 6.6 g 01/30/20- Hemoglobin around 8.8-10  gm from 2021, suspect acute blood loss anemia from  #2/surgery?? Along with anemia of chronic disease -s/p 1 unit PRBC transfusion 1/22.  Hemoglobin holding stable at 8.5 g  Recent Labs  Lab 01/31/20 0225 02/01/20 0238 02/02/20 0511 02/03/20 0339 02/04/20 0129  HGB 6.6* 8.8* 9.3* 8.4* 8.5*  HCT 19.1*  24.6* 28.1* 25.0* 25.3*   Constipation: Continue MiraLAX, Senokot and as needed suppository.  Chronic indwelling Foley catheter last changed 1/7 followed by [name] from urology.  Continue his home regimen.  Patient complains of some bladder irritation and some leakage around the Foley.  Foley is inflated well per nursing.  If continues to issue we can discuss with his urologist.  GOC: Full code, palliative care has been consulted. Does not appear bright.  Nutrition: Diet Order            Diet Heart Room service appropriate? Yes; Fluid consistency: Thin; Fluid restriction: 1500 mL Fluid  Diet effective now                 Nutrition Problem: Moderate Malnutrition Etiology: chronic illness (COPD) Signs/Symptoms: percent weight loss,mild fat depletion,moderate fat depletion Percent weight loss: 17.8 % Interventions: Boost Breeze,MVI  Body mass index is 17.46 kg/m.  Pressure Ulcer: Pressure Injury 01/24/20 Coccyx Mid;Lower Stage 2 -  Partial thickness loss of dermis presenting as a shallow open injury with a red, pink wound bed without slough. (Active)  01/24/20 1445  Location: Coccyx  Location Orientation: Mid;Lower  Staging: Stage 2 -  Partial thickness loss of dermis presenting as a shallow open injury with a red, pink wound bed without slough.  Wound Description (Comments):   Present on Admission: Yes    DVT prophylaxis: enoxaparin (LOVENOX) injection 30 mg Start: 02/03/20 1000 Place TED hose Start: 01/24/20 1314 Code Status:   Code Status: Full Code  Family Communication: plan of care discussed with patient and his wife at bedside updated again today.  Status is: Inpatient Remains inpatient appropriate because:Inpatient level of care appropriate due to severity of illness and For ongoing management of left foot cellulitis, a flutter RVR  Dispo: The patient is from: Home              Anticipated d/c is to: CIR-as per PT OT and reconsulted today               Anticipated d/c date is: Once bed available and if okay w/ cardiology              Patient currently medically stable for discharge   Difficult to place patient No  Consultants:see note  Procedures:see note  Culture/Microbiology    Component Value Date/Time   SDES EXPECTORATED SPUTUM 01/30/2020 1613   SPECREQUEST NONE 01/30/2020 1613   CULT  01/24/2020 1548    NO GROWTH 5 DAYS Performed at [facility] [address], [place], [place] [address]    REPTSTATUS 01/31/2020 FINAL 01/30/2020 1613    Other culture-see note  Medications: Scheduled Meds: . amoxicillin-clavulanate  1 tablet Oral Q12H  . aspirin EC  81 mg Oral Daily  . atorvastatin  40 mg Oral QHS  . budesonide-formoterol  2 puff Inhalation BID  . Chlorhexidine Gluconate Cloth  6 each Topical Daily  . cholecalciferol  2,000 Units Oral Q lunch  . collagenase   Topical Daily  . enoxaparin (LOVENOX) injection  30 mg Subcutaneous Q12H  . feeding supplement  1 Container Oral TID BM  . finasteride  5 mg  Oral Q lunch  . fluticasone  2 spray Each Nare Daily  . furosemide  40 mg Oral Daily  . guaiFENesin  600 mg Oral BID  . levothyroxine  50 mcg Oral Daily  . metoprolol succinate  25 mg Oral Daily  . mirabegron ER  25 mg Oral Daily  . multivitamin with minerals  1 tablet Oral Daily  . pantoprazole  40 mg Oral Daily  . polyethylene glycol  17 g Oral BID  . predniSONE  10 mg Oral Daily  . saccharomyces boulardii  250 mg Oral BID  . senna-docusate  1 tablet Oral BID  . sodium chloride  1 g Oral BID WC  . spironolactone  25 mg Oral Daily  . Tiotropium Bromide Monohydrate  2 puff Inhalation Daily  . vitamin B-12  1,000 mcg Oral Daily   Continuous Infusions:  Antimicrobials: Anti-infectives (From admission, onward)   Start     Dose/Rate Route Frequency Ordered Stop   01/29/20 1415  amoxicillin-clavulanate (AUGMENTIN) 875-125 MG per tablet 1 tablet        1 tablet Oral Every 12 hours 01/29/20 1320     01/29/20  1400  vancomycin (VANCOREADY) IVPB 500 mg/100 mL        500 mg 100 mL/hr over 60 Minutes Intravenous Every 24 hours 01/29/20 0830 01/30/20 1528   01/27/20 2300  vancomycin (VANCOCIN) IVPB 1000 mg/200 mL premix  Status:  Discontinued        1,000 mg 200 mL/hr over 60 Minutes Intravenous Every 48 hours 01/26/20 0844 01/27/20 1304   01/27/20 1400  vancomycin (VANCOREADY) IVPB 750 mg/150 mL  Status:  Discontinued        750 mg 150 mL/hr over 60 Minutes Intravenous Every 24 hours 01/27/20 1304 01/29/20 0830   01/25/20 2200  vancomycin (VANCOREADY) IVPB 750 mg/150 mL  Status:  Discontinued        750 mg 150 mL/hr over 60 Minutes Intravenous Every 24 hours 01/24/20 1122 01/26/20 0844   01/25/20 0700  vancomycin (VANCOCIN) IVPB 1000 mg/200 mL premix  Status:  Discontinued        1,000 mg 200 mL/hr over 60 Minutes Intravenous Every 24 hours 01/24/20 1119 01/24/20 1122   01/24/20 1400  vancomycin (VANCOCIN) IVPB 1000 mg/200 mL premix  Status:  Discontinued        1,000 mg 200 mL/hr over 60 Minutes Intravenous Every 24 hours 01/24/20 1313 01/25/20 0549   01/24/20 0615  vancomycin (VANCOCIN) IVPB 1000 mg/200 mL premix        1,000 mg 200 mL/hr over 60 Minutes Intravenous  Once 01/24/20 0608 01/24/20 0827     Objective: Vitals: Today's Vitals   02/03/20 2050 02/03/20 2104 02/03/20 2151 02/04/20 0300  BP:  (!) 130/57  128/69  Pulse:  75  75  Resp:  18  16  Temp:  98.2 F (36.8 C)  97.8 F (36.6 C)  TempSrc:    Oral  SpO2:  99%  100%  Weight:      Height:      PainSc: 5   3      Intake/Output Summary (Last 24 hours) at 02/04/2020 0805 Last data filed at 02/04/2020 0240 Gross per 24 hour  Intake 626.59 ml  Output 3201 ml  Net -2574.41 ml   Filed Weights   01/29/20 0500 01/30/20 0446 02/03/20 0500  Weight: 52.2 kg 52.6 kg 52.1 kg   Weight change:   Intake/Output from previous day: 01/25 0701 - 01/26 0700 In:  626.6 [P.O.:480; I.V.:146.6] Out: 3201 [Urine:3200;  Stool:1] Intake/Output this shift: No intake/output data recorded. Filed Weights   01/29/20 0500 01/30/20 0446 02/03/20 0500  Weight: 52.2 kg 52.6 kg 52.1 kg   Examination: General exam: AAO at baseline, not in distress HEENT:Oral mucosa moist, Ear/Nose WNL grossly, dentition normal. Respiratory system: bilaterally diminished,no wheezing or crackles,no use of accessory muscle Cardiovascular system: S1 & S2 +, No JVD,. Gastrointestinal system: Abdomen soft, NT,ND, BS+ Nervous System:Alert, awake, moving extremities and grossly nonfocal Extremities: Right BKA stump intact stable dressing in place, left foot redness edema tenderness improved  Skin: No rashes,no icterus.   Data Reviewed: I have personally reviewed following labs and imaging studies CBC: Recent Labs  Lab 01/29/20 0124 01/30/20 0223 01/31/20 0225 02/01/20 0238 02/02/20 0511 02/03/20 0339 02/04/20 0129  WBC 12.2* 10.6* 6.0 7.8 7.8 7.2 7.7  NEUTROABS 10.1* 9.2* 5.6  --   --   --   --   HGB 7.5* 7.0* 6.6* 8.8* 9.3* 8.4* 8.5*  HCT 21.9* 20.1* 19.1* 24.6* 28.1* 25.0* 25.3*  MCV 96.1 95.3 93.6 91.1 94.0 94.0 94.1  PLT 167 158 180 196 215 213 017   Basic Metabolic Panel: Recent Labs  Lab 01/30/20 0223 01/31/20 0225 02/01/20 0238 02/02/20 0511 02/03/20 0339 02/04/20 0129  NA 130* 126* 129* 128* 125* 126*  K 4.1 4.3 4.6 4.2 4.3 4.1  CL 94* 91* 93* 91* 88* 88*  CO2 _0 GLUCOSE 111* 136* 117* 93 95 85  BUN _1 CREATININE 1.07 0.86 0.86 0.90 0.96 1.02  CALCIUM 8.7* 8.6* 9.1 9.2 8.7* 8.7*  MG 1.8 2.0  --   --   --   --    GFR: Estimated Creatinine Clearance: 36.9 mL/min (by C-G formula based on SCr of 1.02 mg/dL). Liver Function Tests: Recent Labs  Lab 01/30/20 0223  AST 24  ALT 15  ALKPHOS 44  BILITOT 0.7  PROT 4.6*  ALBUMIN 2.9*   No results for input(s): LIPASE, AMYLASE in the last 168 hours. No results for input(s): AMMONIA in the last 168 hours. Coagulation  Profile: No results for input(s): INR, PROTIME in the last 168 hours. Cardiac Enzymes: No results for input(s): CKTOTAL, CKMB, CKMBINDEX, TROPONINI in the last 168 hours. BNP (last 3 results) No results for input(s): PROBNP in the last 8760 hours. HbA1C: No results for input(s): HGBA1C in the last 72 hours. CBG: No results for input(s): GLUCAP in the last 168 hours. Lipid Profile: No results for input(s): CHOL, HDL, LDLCALC, TRIG, CHOLHDL, LDLDIRECT in the last 72 hours. Thyroid Function Tests: No results for input(s): TSH, T4TOTAL, FREET4, T3FREE, THYROIDAB in the last 72 hours. Anemia Panel: No results for input(s): VITAMINB12, FOLATE, FERRITIN, TIBC, IRON, RETICCTPCT in the last 72 hours. Sepsis Labs: No results for input(s): PROCALCITON, LATICACIDVEN in the last 168 hours.  Recent Results (from the past 240 hour(s))  Surgical PCR screen     Status: None   Collection Time: 01/27/20  6:03 AM   Specimen: Nasal Mucosa; Nasal Swab  Result Value Ref Range Status   MRSA, PCR NEGATIVE NEGATIVE Final   Staphylococcus aureus NEGATIVE NEGATIVE Final    Comment: (NOTE) The Xpert SA Assay (FDA approved for NASAL specimens in patients 35 years of age and older), is one component of a comprehensive surveillance program. It is not intended to diagnose infection nor to guide or monitor treatment. Performed at [facility], [place]  [address], [place], [place] [address]   Expectorated sputum assessment w rflx to resp cult     Status: None   Collection Time: 01/30/20  4:13 PM   Specimen: Expectorated Sputum  Result Value Ref Range Status   Specimen Description EXPECTORATED SPUTUM  Final   Special Requests NONE  Final   Sputum evaluation   Final    Sputum specimen not acceptable for testing.  Please recollect.   Gram Stain Report Called to,Read Back By and Verified With: RN Dion Saucier [phone] MLM Performed at [facility], [place] [address] [place], [place] [address]    Report  Status 01/31/2020 FINAL  Final     Radiology Studies: No results found.   LOS: 11 days   Antonieta Pert, MD [facility]  02/04/2020, 8:05 AM

## 2020-02-05 ENCOUNTER — Encounter (HOSPITAL_COMMUNITY): Payer: Self-pay | Admitting: Physical Medicine & Rehabilitation

## 2020-02-05 ENCOUNTER — Inpatient Hospital Stay (HOSPITAL_COMMUNITY)
Admission: RE | Admit: 2020-02-05 | Discharge: 2020-02-16 | DRG: 945 | Disposition: A | Discharge status: Home Health | Payer: Medicare Other | Source: Intra-hospital | Attending: Physical Medicine & Rehabilitation | Admitting: Physical Medicine & Rehabilitation

## 2020-02-05 ENCOUNTER — Other Ambulatory Visit: Payer: Self-pay

## 2020-02-05 ENCOUNTER — Other Ambulatory Visit: Payer: Self-pay | Admitting: *Deleted

## 2020-02-05 DIAGNOSIS — F332 Major depressive disorder, recurrent severe without psychotic features: Secondary | ICD-10-CM | POA: Diagnosis not present

## 2020-02-05 DIAGNOSIS — Z880 Allergy status to penicillin: Secondary | ICD-10-CM

## 2020-02-05 DIAGNOSIS — I10 Essential (primary) hypertension: Secondary | ICD-10-CM | POA: Diagnosis not present

## 2020-02-05 DIAGNOSIS — N401 Enlarged prostate with lower urinary tract symptoms: Secondary | ICD-10-CM | POA: Diagnosis present

## 2020-02-05 DIAGNOSIS — E8809 Other disorders of plasma-protein metabolism, not elsewhere classified: Secondary | ICD-10-CM | POA: Diagnosis present

## 2020-02-05 DIAGNOSIS — E785 Hyperlipidemia, unspecified: Secondary | ICD-10-CM | POA: Diagnosis present

## 2020-02-05 DIAGNOSIS — Z89611 Acquired absence of right leg above knee: Secondary | ICD-10-CM

## 2020-02-05 DIAGNOSIS — F324 Major depressive disorder, single episode, in partial remission: Secondary | ICD-10-CM | POA: Diagnosis not present

## 2020-02-05 DIAGNOSIS — I480 Paroxysmal atrial fibrillation: Secondary | ICD-10-CM | POA: Diagnosis present

## 2020-02-05 DIAGNOSIS — Z8249 Family history of ischemic heart disease and other diseases of the circulatory system: Secondary | ICD-10-CM

## 2020-02-05 DIAGNOSIS — K219 Gastro-esophageal reflux disease without esophagitis: Secondary | ICD-10-CM | POA: Diagnosis present

## 2020-02-05 DIAGNOSIS — I5032 Chronic diastolic (congestive) heart failure: Secondary | ICD-10-CM | POA: Diagnosis present

## 2020-02-05 DIAGNOSIS — D649 Anemia, unspecified: Secondary | ICD-10-CM | POA: Diagnosis present

## 2020-02-05 DIAGNOSIS — H9190 Unspecified hearing loss, unspecified ear: Secondary | ICD-10-CM | POA: Diagnosis present

## 2020-02-05 DIAGNOSIS — Z7982 Long term (current) use of aspirin: Secondary | ICD-10-CM

## 2020-02-05 DIAGNOSIS — R7303 Prediabetes: Secondary | ICD-10-CM

## 2020-02-05 DIAGNOSIS — Z9981 Dependence on supplemental oxygen: Secondary | ICD-10-CM

## 2020-02-05 DIAGNOSIS — R338 Other retention of urine: Secondary | ICD-10-CM | POA: Diagnosis present

## 2020-02-05 DIAGNOSIS — Z7952 Long term (current) use of systemic steroids: Secondary | ICD-10-CM

## 2020-02-05 DIAGNOSIS — L03116 Cellulitis of left lower limb: Secondary | ICD-10-CM | POA: Diagnosis present

## 2020-02-05 DIAGNOSIS — Z882 Allergy status to sulfonamides status: Secondary | ICD-10-CM

## 2020-02-05 DIAGNOSIS — I4891 Unspecified atrial fibrillation: Secondary | ICD-10-CM | POA: Diagnosis present

## 2020-02-05 DIAGNOSIS — I2510 Atherosclerotic heart disease of native coronary artery without angina pectoris: Secondary | ICD-10-CM | POA: Diagnosis present

## 2020-02-05 DIAGNOSIS — J9611 Chronic respiratory failure with hypoxia: Secondary | ICD-10-CM | POA: Diagnosis present

## 2020-02-05 DIAGNOSIS — F329 Major depressive disorder, single episode, unspecified: Secondary | ICD-10-CM

## 2020-02-05 DIAGNOSIS — E782 Mixed hyperlipidemia: Secondary | ICD-10-CM | POA: Diagnosis not present

## 2020-02-05 DIAGNOSIS — E46 Unspecified protein-calorie malnutrition: Secondary | ICD-10-CM | POA: Diagnosis not present

## 2020-02-05 DIAGNOSIS — M159 Polyosteoarthritis, unspecified: Secondary | ICD-10-CM | POA: Diagnosis present

## 2020-02-05 DIAGNOSIS — F411 Generalized anxiety disorder: Secondary | ICD-10-CM | POA: Diagnosis present

## 2020-02-05 DIAGNOSIS — R5381 Other malaise: Secondary | ICD-10-CM | POA: Diagnosis present

## 2020-02-05 DIAGNOSIS — J449 Chronic obstructive pulmonary disease, unspecified: Secondary | ICD-10-CM | POA: Diagnosis present

## 2020-02-05 DIAGNOSIS — E871 Hypo-osmolality and hyponatremia: Secondary | ICD-10-CM | POA: Diagnosis present

## 2020-02-05 DIAGNOSIS — Z951 Presence of aortocoronary bypass graft: Secondary | ICD-10-CM

## 2020-02-05 DIAGNOSIS — I483 Typical atrial flutter: Secondary | ICD-10-CM | POA: Diagnosis not present

## 2020-02-05 DIAGNOSIS — R062 Wheezing: Secondary | ICD-10-CM

## 2020-02-05 DIAGNOSIS — Z7951 Long term (current) use of inhaled steroids: Secondary | ICD-10-CM

## 2020-02-05 DIAGNOSIS — Z79890 Hormone replacement therapy (postmenopausal): Secondary | ICD-10-CM

## 2020-02-05 DIAGNOSIS — L8990 Pressure ulcer of unspecified site, unspecified stage: Secondary | ICD-10-CM | POA: Diagnosis present

## 2020-02-05 DIAGNOSIS — E039 Hypothyroidism, unspecified: Secondary | ICD-10-CM | POA: Diagnosis present

## 2020-02-05 DIAGNOSIS — R319 Hematuria, unspecified: Secondary | ICD-10-CM | POA: Diagnosis not present

## 2020-02-05 DIAGNOSIS — F419 Anxiety disorder, unspecified: Secondary | ICD-10-CM | POA: Diagnosis present

## 2020-02-05 DIAGNOSIS — L89159 Pressure ulcer of sacral region, unspecified stage: Secondary | ICD-10-CM | POA: Diagnosis present

## 2020-02-05 DIAGNOSIS — Z85828 Personal history of other malignant neoplasm of skin: Secondary | ICD-10-CM

## 2020-02-05 DIAGNOSIS — L89152 Pressure ulcer of sacral region, stage 2: Secondary | ICD-10-CM | POA: Diagnosis present

## 2020-02-05 DIAGNOSIS — R339 Retention of urine, unspecified: Secondary | ICD-10-CM | POA: Diagnosis not present

## 2020-02-05 DIAGNOSIS — Z87891 Personal history of nicotine dependence: Secondary | ICD-10-CM

## 2020-02-05 DIAGNOSIS — I509 Heart failure, unspecified: Secondary | ICD-10-CM | POA: Diagnosis not present

## 2020-02-05 DIAGNOSIS — I110 Hypertensive heart disease with heart failure: Secondary | ICD-10-CM | POA: Diagnosis present

## 2020-02-05 DIAGNOSIS — Z803 Family history of malignant neoplasm of breast: Secondary | ICD-10-CM

## 2020-02-05 DIAGNOSIS — I4892 Unspecified atrial flutter: Secondary | ICD-10-CM | POA: Diagnosis present

## 2020-02-05 DIAGNOSIS — N400 Enlarged prostate without lower urinary tract symptoms: Secondary | ICD-10-CM | POA: Diagnosis not present

## 2020-02-05 DIAGNOSIS — L02416 Cutaneous abscess of left lower limb: Secondary | ICD-10-CM

## 2020-02-05 DIAGNOSIS — Z79899 Other long term (current) drug therapy: Secondary | ICD-10-CM

## 2020-02-05 DIAGNOSIS — I25810 Atherosclerosis of coronary artery bypass graft(s) without angina pectoris: Secondary | ICD-10-CM | POA: Diagnosis not present

## 2020-02-05 DIAGNOSIS — I252 Old myocardial infarction: Secondary | ICD-10-CM

## 2020-02-05 DIAGNOSIS — L89892 Pressure ulcer of other site, stage 2: Secondary | ICD-10-CM | POA: Diagnosis present

## 2020-02-05 DIAGNOSIS — Z888 Allergy status to other drugs, medicaments and biological substances status: Secondary | ICD-10-CM

## 2020-02-05 DIAGNOSIS — Z7189 Other specified counseling: Secondary | ICD-10-CM

## 2020-02-05 DIAGNOSIS — E1151 Type 2 diabetes mellitus with diabetic peripheral angiopathy without gangrene: Secondary | ICD-10-CM | POA: Diagnosis present

## 2020-02-05 DIAGNOSIS — N3289 Other specified disorders of bladder: Secondary | ICD-10-CM | POA: Diagnosis not present

## 2020-02-05 DIAGNOSIS — Z881 Allergy status to other antibiotic agents status: Secondary | ICD-10-CM

## 2020-02-05 DIAGNOSIS — Z823 Family history of stroke: Secondary | ICD-10-CM

## 2020-02-05 DIAGNOSIS — I25118 Atherosclerotic heart disease of native coronary artery with other forms of angina pectoris: Secondary | ICD-10-CM | POA: Diagnosis not present

## 2020-02-05 DIAGNOSIS — Z83438 Family history of other disorder of lipoprotein metabolism and other lipidemia: Secondary | ICD-10-CM

## 2020-02-05 DIAGNOSIS — G8918 Other acute postprocedural pain: Secondary | ICD-10-CM | POA: Diagnosis not present

## 2020-02-05 LAB — CBC
HCT: 25.9 % — ABNORMAL LOW (ref 39.0–52.0)
Hemoglobin: 8.8 g/dL — ABNORMAL LOW (ref 13.0–17.0)
MCH: 31.9 pg (ref 26.0–34.0)
MCHC: 34 g/dL (ref 30.0–36.0)
MCV: 93.8 fL (ref 80.0–100.0)
Platelets: 246 10*3/uL (ref 150–400)
RBC: 2.76 MIL/uL — ABNORMAL LOW (ref 4.22–5.81)
RDW: 13.4 % (ref 11.5–15.5)
WBC: 7.7 10*3/uL (ref 4.0–10.5)
nRBC: 0 % (ref 0.0–0.2)

## 2020-02-05 LAB — BASIC METABOLIC PANEL
Anion gap: 10 (ref 5–15)
BUN: 15 mg/dL (ref 8–23)
CO2: 27 mmol/L (ref 22–32)
Calcium: 8.6 mg/dL — ABNORMAL LOW (ref 8.9–10.3)
Chloride: 90 mmol/L — ABNORMAL LOW (ref 98–111)
Creatinine, Ser: 0.93 mg/dL (ref 0.61–1.24)
GFR, Estimated: >60 mL/min (ref >60)
Glucose, Bld: 99 mg/dL (ref 70–99)
Potassium: 4.4 mmol/L (ref 3.5–5.1)
Sodium: 127 mmol/L — ABNORMAL LOW (ref 135–145)

## 2020-02-05 MED ORDER — ENOXAPARIN SODIUM 30 MG/0.3ML ~~LOC~~ SOLN
30.0000 mg | Freq: Two times a day (BID) | SUBCUTANEOUS | Status: DC
  Administered 2020-02-05 – 2020-02-08 (×6): 30 mg via SUBCUTANEOUS
  Filled 2020-02-05 (×6): qty 0.3

## 2020-02-05 MED ORDER — POLYVINYL ALCOHOL 1.4 % OP SOLN
1.0000 [drp] | OPHTHALMIC | Status: DC | PRN
  Filled 2020-02-05: qty 15

## 2020-02-05 MED ORDER — ENOXAPARIN SODIUM 30 MG/0.3ML ~~LOC~~ SOLN: 30.0000 mg | Freq: Two times a day (BID) | SUBCUTANEOUS | Status: DC

## 2020-02-05 MED ORDER — GUAIFENESIN ER 600 MG PO TB12
600.0000 mg | ORAL_TABLET | Freq: Two times a day (BID) | ORAL | Status: DC
  Administered 2020-02-05 – 2020-02-16 (×20): 600 mg via ORAL
  Filled 2020-02-05 (×22): qty 1

## 2020-02-05 MED ORDER — GUAIFENESIN ER 600 MG PO TB12: 600.0000 mg | ORAL_TABLET | Freq: Two times a day (BID) | ORAL | Status: DC

## 2020-02-05 MED ORDER — MYRBETRIQ 25 MG PO TB24: 25.0000 mg | ORAL_TABLET | Freq: Every day | ORAL | Status: DC

## 2020-02-05 MED ORDER — ADULT MULTIVITAMIN W/MINERALS CH
1.0000 | ORAL_TABLET | Freq: Every day | ORAL | Status: DC
  Administered 2020-02-06 – 2020-02-16 (×11): 1 via ORAL
  Filled 2020-02-05 (×11): qty 1

## 2020-02-05 MED ORDER — NITROGLYCERIN 0.4 MG SL SUBL: 0.4000 mg | SUBLINGUAL_TABLET | SUBLINGUAL | Status: DC | PRN

## 2020-02-05 MED ORDER — SACCHAROMYCES BOULARDII 250 MG PO CAPS
250.0000 mg | ORAL_CAPSULE | Freq: Two times a day (BID) | ORAL | Status: DC
  Administered 2020-02-06 – 2020-02-16 (×19): 250 mg via ORAL
  Filled 2020-02-05 (×22): qty 1

## 2020-02-05 MED ORDER — FLUTICASONE PROPIONATE 50 MCG/ACT NA SUSP
2.0000 | Freq: Every day | NASAL | Status: DC
  Administered 2020-02-08 – 2020-02-16 (×6): 2 via NASAL
  Filled 2020-02-05: qty 16

## 2020-02-05 MED ORDER — SENNOSIDES-DOCUSATE SODIUM 8.6-50 MG PO TABS: 1.0000 | ORAL_TABLET | Freq: Two times a day (BID) | ORAL | Status: DC

## 2020-02-05 MED ORDER — POLYETHYLENE GLYCOL 3350 17 G PO PACK
17.0000 g | PACK | Freq: Two times a day (BID) | ORAL | Status: DC
  Filled 2020-02-05 (×3): qty 1

## 2020-02-05 MED ORDER — AMOXICILLIN-POT CLAVULANATE 875-125 MG PO TABS
1.0000 | ORAL_TABLET | Freq: Two times a day (BID) | ORAL | Status: AC
  Administered 2020-02-05 – 2020-02-07 (×4): 1 via ORAL
  Filled 2020-02-05 (×4): qty 1

## 2020-02-05 MED ORDER — SENNOSIDES-DOCUSATE SODIUM 8.6-50 MG PO TABS
1.0000 | ORAL_TABLET | Freq: Two times a day (BID) | ORAL | Status: DC
  Administered 2020-02-06: 1 via ORAL
  Filled 2020-02-05 (×5): qty 1

## 2020-02-05 MED ORDER — TIOTROPIUM BROMIDE MONOHYDRATE 2.5 MCG/ACT IN AERS
2.0000 | INHALATION_SPRAY | Freq: Every day | RESPIRATORY_TRACT | Status: DC
  Administered 2020-02-06 – 2020-02-16 (×11): 2 via RESPIRATORY_TRACT

## 2020-02-05 MED ORDER — VITAMIN B-12 1000 MCG PO TABS
1000.0000 ug | ORAL_TABLET | Freq: Every day | ORAL | Status: DC
  Administered 2020-02-06 – 2020-02-16 (×11): 1000 ug via ORAL
  Filled 2020-02-05 (×12): qty 1

## 2020-02-05 MED ORDER — MIRABEGRON ER 25 MG PO TB24
25.0000 mg | ORAL_TABLET | Freq: Every day | ORAL | Status: DC
  Administered 2020-02-06: 25 mg via ORAL

## 2020-02-05 MED ORDER — COLLAGENASE 250 UNIT/GM EX OINT
TOPICAL_OINTMENT | Freq: Every day | CUTANEOUS | Status: DC
  Filled 2020-02-05: qty 30

## 2020-02-05 MED ORDER — PROSOURCE PLUS PO LIQD
30.0000 mL | Freq: Two times a day (BID) | ORAL | Status: DC
  Filled 2020-02-05: qty 30

## 2020-02-05 MED ORDER — BUDESONIDE-FORMOTEROL FUMARATE 160-4.5 MCG/ACT IN AERO
2.0000 | INHALATION_SPRAY | Freq: Two times a day (BID) | RESPIRATORY_TRACT | Status: DC
  Administered 2020-02-05 – 2020-02-16 (×21): 2 via RESPIRATORY_TRACT

## 2020-02-05 MED ORDER — PANTOPRAZOLE SODIUM 40 MG PO TBEC
40.0000 mg | DELAYED_RELEASE_TABLET | Freq: Every day | ORAL | Status: DC
  Administered 2020-02-06 – 2020-02-16 (×11): 40 mg via ORAL
  Filled 2020-02-05 (×10): qty 1

## 2020-02-05 MED ORDER — ACETAMINOPHEN 325 MG PO TABS
650.0000 mg | ORAL_TABLET | Freq: Four times a day (QID) | ORAL | Status: DC | PRN
  Administered 2020-02-05 – 2020-02-16 (×27): 650 mg via ORAL
  Filled 2020-02-05 (×27): qty 2

## 2020-02-05 MED ORDER — ASPIRIN EC 81 MG PO TBEC
81.0000 mg | DELAYED_RELEASE_TABLET | Freq: Every day | ORAL | Status: DC
  Administered 2020-02-06 – 2020-02-16 (×11): 81 mg via ORAL
  Filled 2020-02-05 (×11): qty 1

## 2020-02-05 MED ORDER — MOMETASONE FURO-FORMOTEROL FUM 200-5 MCG/ACT IN AERO
2.0000 | INHALATION_SPRAY | Freq: Two times a day (BID) | RESPIRATORY_TRACT | Status: DC
  Filled 2020-02-05: qty 8.8

## 2020-02-05 MED ORDER — PREDNISONE 5 MG PO TABS
10.0000 mg | ORAL_TABLET | Freq: Every day | ORAL | Status: DC
  Administered 2020-02-06 – 2020-02-16 (×11): 10 mg via ORAL
  Filled 2020-02-05 (×11): qty 2

## 2020-02-05 MED ORDER — MIRABEGRON ER 25 MG PO TB24: 25.0000 mg | ORAL_TABLET | Freq: Every day | ORAL | Status: DC

## 2020-02-05 MED ORDER — BISACODYL 10 MG RE SUPP: 10.0000 mg | Freq: Every day | RECTAL | Status: DC | PRN

## 2020-02-05 MED ORDER — SODIUM CHLORIDE 1 G PO TABS
1.0000 g | ORAL_TABLET | Freq: Two times a day (BID) | ORAL | Status: AC
  Administered 2020-02-05: 1 g via ORAL
  Filled 2020-02-05 (×2): qty 1

## 2020-02-05 MED ORDER — HYDROCODONE-ACETAMINOPHEN 5-325 MG PO TABS
1.0000 | ORAL_TABLET | ORAL | Status: DC | PRN
  Filled 2020-02-05 (×3): qty 1

## 2020-02-05 MED ORDER — ALPRAZOLAM 0.25 MG PO TABS
0.2500 mg | ORAL_TABLET | Freq: Three times a day (TID) | ORAL | Status: DC | PRN
  Administered 2020-02-05 – 2020-02-16 (×24): 0.25 mg via ORAL
  Filled 2020-02-05 (×27): qty 1

## 2020-02-05 MED ORDER — METOPROLOL SUCCINATE ER 25 MG PO TB24: 25.0000 mg | ORAL_TABLET | Freq: Every day | ORAL | Status: DC

## 2020-02-05 MED ORDER — FINASTERIDE 5 MG PO TABS
5.0000 mg | ORAL_TABLET | Freq: Every day | ORAL | Status: DC
  Administered 2020-02-06 – 2020-02-15 (×10): 5 mg via ORAL
  Filled 2020-02-05 (×11): qty 1

## 2020-02-05 MED ORDER — METOPROLOL SUCCINATE ER 25 MG PO TB24
25.0000 mg | ORAL_TABLET | Freq: Every day | ORAL | Status: DC
  Administered 2020-02-06 – 2020-02-16 (×11): 25 mg via ORAL
  Filled 2020-02-05 (×13): qty 1

## 2020-02-05 MED ORDER — CALCIUM CARBONATE ANTACID 500 MG PO CHEW
1.0000 | CHEWABLE_TABLET | Freq: Two times a day (BID) | ORAL | Status: DC | PRN
  Administered 2020-02-08: 200 mg via ORAL
  Filled 2020-02-05: qty 1

## 2020-02-05 MED ORDER — SACCHAROMYCES BOULARDII 250 MG PO CAPS: 250.0000 mg | ORAL_CAPSULE | Freq: Two times a day (BID) | ORAL | 0 refills | Status: DC

## 2020-02-05 MED ORDER — ATORVASTATIN CALCIUM 40 MG PO TABS
40.0000 mg | ORAL_TABLET | Freq: Every day | ORAL | Status: DC
  Administered 2020-02-05 – 2020-02-15 (×11): 40 mg via ORAL
  Filled 2020-02-05 (×12): qty 1

## 2020-02-05 MED ORDER — VITAMIN D 25 MCG (1000 UNIT) PO TABS
2000.0000 [IU] | ORAL_TABLET | Freq: Every day | ORAL | Status: DC
  Administered 2020-02-06 – 2020-02-15 (×10): 2000 [IU] via ORAL
  Filled 2020-02-05 (×10): qty 2

## 2020-02-05 MED ORDER — PROSOURCE PLUS PO LIQD
30.0000 mL | Freq: Two times a day (BID) | ORAL | Status: DC
  Administered 2020-02-06 – 2020-02-16 (×7): 30 mL via ORAL
  Filled 2020-02-05 (×11): qty 30

## 2020-02-05 MED ORDER — ALBUTEROL SULFATE (2.5 MG/3ML) 0.083% IN NEBU: 2.5000 mg | INHALATION_SOLUTION | RESPIRATORY_TRACT | Status: DC | PRN

## 2020-02-05 MED ORDER — PANTOPRAZOLE SODIUM 40 MG PO TBEC: DELAYED_RELEASE_TABLET | ORAL | 3 refills | Status: DC

## 2020-02-05 MED ORDER — LEVOTHYROXINE SODIUM 50 MCG PO TABS
50.0000 ug | ORAL_TABLET | Freq: Every day | ORAL | Status: DC
  Administered 2020-02-06 – 2020-02-16 (×11): 50 ug via ORAL
  Filled 2020-02-05 (×11): qty 1

## 2020-02-05 MED ORDER — AMOXICILLIN-POT CLAVULANATE 875-125 MG PO TABS: 1.0000 | ORAL_TABLET | Freq: Two times a day (BID) | ORAL | 0 refills | Status: DC

## 2020-02-05 NOTE — Progress Notes (Signed)
Inpatient Rehabilitation Medication Review by a Pharmacist  A complete drug regimen review was completed for this patient to identify any potential clinically significant medication issues.  Clinically significant medication issues were identified:  yes   Type of Medication Issue Identified Description of Issue Urgent (address now) Non-Urgent (address on AM team rounds) Plan   Drug Interaction(s) (clinically significant)       Duplicate Therapy       Allergy       No Medication Administration End Date       Incorrect Dose       Additional Drug Therapy Needed  Per DC summary, holding furosemide and spironolactone at least one more day, will need follow-up with cardiology to resume diuretics while in rehab  Home famotidine QHS PRN  Home NaCl 5% eye drops (MURO) QID held  Non-urgent F/u with cardiology, consider restarting furosemide and spironolactone on 1/28 per last cardiology note   Resume famotidine prn if patient requests  Resume MURO drops if patient can bring from home    Other  Symbicort inhaler changed to [facility] on transition to CIR per hospital formulary but patient has home Symbicor at bedside   Levalbuterol nebs changed to albuterol nebs  Home Kcl 20 mEq daily held  [place] back to home Symbicort per protocol  Change back to levalbuterol if patient requests since this is patient's home med as well   K is wnl and is not on diuretics now. Continue to hold      For non-urgent medication issues to be resolved on team rounds tomorrow morning a [facility] Secure Chat Handoff was sent to: [place]    Time spent performing this drug regimen review (minutes):  [place], PharmD, [place], [facility] Clinical Pharmacist  Please check AMION for all [place] phone numbers After 10:00 PM, call [place]

## 2020-02-05 NOTE — Progress Notes (Signed)
PMR Admission Coordinator Pre-Admission Assessment   Patient: [name] is an 87 y.o., male MRN: [id number] DOB: Aug 15, 1929 Height: 5\' 8"  (172.7 cm) Weight: 52.7 kg   Insurance Information HMO:     PPO:      PCP:      IPA:      80/20:      OTHER:  PRIMARY: Medicare A and B      Policy#:[id number]             Subscriber: pt CM Name:       Phone#:     Fax#:  Pre-Cert#: verified Civil engineer, contracting:  Benefits:  Phone #:      Name:  Eff. Date: A and B 07/09/96     Deduct: $1556      Out of Pocket Max: n/a      Life Max:  CIR: 100%      SNF: 20 full days Outpatient: 80%     Co-Ins: 20% Home Health: 100%      Co-Pay:  DME: 80%     Co-Ins: 20% Providers:  SECONDARYHolland [name]      Policy#: [id number]     Phone#: [phone]   Financial Counselor:       Phone#:    The "Data Collection Information Summary" for patients in Inpatient Rehabilitation Facilities with attached "Privacy Act [name]" was provided and verbally reviewed with: Family   Emergency Contact Information         Contact Information     Name Relation Home Work Mobile    [name] Spouse     [name] Son     [phone]    [name],[name] Daughter     [phone]         Current Medical History  Patient Admitting Diagnosis: L AKA revision, debility    History of Present Illness: [name]. [name] is a 87 year old right-handed male with history significant for chronic urinary retention with chronic indwelling Foley, followed by [name] of urology services (foley last changed 01/16/2020), hypertension, hyperlipidemia, CAD with CABG 2003 followed by cardiology services Dr. Daneen Schick, COPD 2 L oxygen, peripheral vascular disease, diastolic congestive heart failure, sacral decubitus ulcer, right AKA 08/11/2019 receiving inpatient rehab services 08/21/2019 to 09/01/2019, and was discharged to skilled nursing facility.  Presented 01/24/2020 with left lower extremity cellulitis he had been  seeing his primary care provider and maintained on antibiotics.  He was sent to the emergency department for possible admission for cellulitis and initially sent home on doxycycline.  He was later seen at the wound clinic for his left foot continued to worsen despite antibiotics.  Vascular surgery follow-up Dr. Donzetta Matters and on examination was noted to have open wound of the right AKA stump with exposed bone.  X-rays obtained showing some demineralization but no osteomyelitis.  MRI was ordered to evaluate osteomyelitis showing no areas of abnormal marrow signal or cortical destruction.  Patient did undergo revision of right AKA 01/27/2020 per Dr. Donzetta Matters.  Work-up of left lower extremity cellulitis MRSA screen negative blood cultures no growth he did complete 7 days antibiotics but left foot looked worse. ESR/CRP slightly elevated placed on Augmentin 01/29/2020 to be completed 02/05/2020 with improvement in latest WBC 7.7.  Patient had declined amputation of left foot.  Hospital course patient has had some hyponatremia 130-126.  Cardiology services consulted during hospital stay for episodic chest pain/atrial flutter with RVR of new onset.  Placed on Toprol  not a candidate for long-term DOAC.  30-day event monitor planned at discharge.  Currently holding Lasix and Aldactone due to hyponatremia and resume as needed.  Palliative care was consulted to establish goals of care.  Patient with decubitus ulcer buttocks. Santyl ointment twice daily with wound care nurse follow-up.  Random Covid testing 02/04/2020 negative.  Therapy evaluations completed due to patient decrease in functional ability and he was recommended for a comprehensive rehab program.   Patient's medical record from [name] has been reviewed by the rehabilitation admission coordinator and physician.   Past Medical History      Past Medical History:  Diagnosis Date  . Anxiety    . Arthritis      "generalized" (04/04/2017)  . Asthma    . Basal cell  carcinoma      "left ear"  . BPH (benign prostatic hyperplasia)      severe; s/p multiple biopsies  . CAD (coronary artery disease)    . Carotid artery occlusion    . Chronic respiratory failure ([place])    . Chronic rhinitis    . COPD (chronic obstructive pulmonary disease) ([facility])      2L [place] O2  . Diastolic heart failure ([place]) 2019  . Dilation of biliary tract    . Elevated troponin 03/09/2017  . Gallstones    . GERD (gastroesophageal reflux disease)    . History of blood transfusion      "w/his CABG" (04/04/2017)  . History of kidney stones    . Hyperlipidemia    . Hypertension    . On home oxygen therapy      "~ 24/7" (04/04/2017)  . Peripheral vascular disease ([place])    . Pneumonia 2019  . Squamous cell carcinoma of skin 04/23/2013    in situ-Right flank (txpbx)  . Squamous cell carcinoma of skin 01/23/2017    [place]/ in siut- Left ear rim (CX3FU)  . Syncope and collapse    . Ureteral tumor 08/2015    had endoscopic procedure for evaluation, unable to reach for biopsy  . Urinary catheter (Foley) change required        Family History   family history includes Allergies in his brother; Breast cancer in his mother; Cancer in his mother; Heart attack in his father; Heart disease in his brother, brother, father, and mother; Hyperlipidemia in his brother; Hypertension in his brother and mother; Other in his mother; Peripheral vascular disease in his brother; Stroke in his brother.   Prior Rehab/Hospitalizations Has the patient had prior rehab or hospitalizations prior to admission? Yes   Has the patient had major surgery during 100 days prior to admission? Yes              Current Medications   Current Facility-Administered Medications:  .  (feeding supplement) PROSource Plus liquid 30 mL, 30 mL, Oral, BID BM, Kc, Ramesh, MD .  acetaminophen (TYLENOL) tablet 650 mg, 650 mg, Oral, Q6H PRN, Starla Link, Kshitiz, MD, 650 mg at 02/05/20 1018 .  ALPRAZolam Duanne Moron) tablet 0.25 mg, 0.25 mg,  Oral, TID PRN, Florencia Reasons, MD, 0.25 mg at 02/05/20 1022 .  alum & mag hydroxide-simeth (MAALOX/MYLANTA) 200-200-20 MG/5ML suspension 15 mL, 15 mL, Oral, Q4H PRN, Florencia Reasons, MD .  amoxicillin-clavulanate (AUGMENTIN) 875-125 MG per tablet 1 tablet, 1 tablet, Oral, Q12H, Florencia Reasons, MD, 1 tablet at 02/05/20 1018 .  aspirin EC tablet 81 mg, 81 mg, Oral, Daily, Laurence Slate M, PA-C, 81 mg at 02/05/20 1018 .  atorvastatin (  LIPITOR) tablet 40 mg, 40 mg, Oral, QHS, Pemberton, Greer Ee, MD, 40 mg at 02/04/20 2041 .  bisacodyl (DULCOLAX) suppository 10 mg, 10 mg, Rectal, Daily PRN, Florencia Reasons, MD .  budesonide-formoterol [facility]) 160-4.5 MCG/ACT inhaler 2 puff, 2 puff, Inhalation, BID, Alvira Philips, [place], 2 puff at 02/05/20 0805 .  calcium carbonate (TUMS - dosed in mg elemental calcium) chewable tablet 200 mg of elemental calcium, 1 tablet, Oral, BID PRN, Florencia Reasons, MD .  Chlorhexidine Gluconate Cloth 2 % PADS 6 each, 6 each, Topical, Daily, Ulyses Amor, PA-C, 6 each at 02/05/20 1019 .  cholecalciferol (VITAMIN D3) tablet 2,000 Units, 2,000 Units, Oral, Q lunch, Laurence Slate M, [place], 2,000 Units at 02/04/20 1313 .  collagenase (SANTYL) ointment, , Topical, Daily, Ulyses Amor, [place], Given at 02/05/20 1019 .  enoxaparin (LOVENOX) injection 30 mg, 30 mg, Subcutaneous, Q12H, Croitoru, Mihai, MD, 30 mg at 02/05/20 1019 .  finasteride (PROSCAR) tablet 5 mg, 5 mg, Oral, Q lunch, [place], Emma M, [place], 5 mg at 02/04/20 1313 .  fluticasone (FLONASE) 50 MCG/ACT nasal spray 2 spray, 2 spray, Each Nare, Daily, Collins, Emma M, PA-C, 2 spray at 01/28/20 1043 .  guaiFENesin (MUCINEX) 12 hr tablet 600 mg, 600 mg, Oral, BID, Florencia Reasons, MD, 600 mg at 02/04/20 2042 .  HYDROcodone-acetaminophen (NORCO/VICODIN) 5-325 MG per tablet 1 tablet, 1 tablet, Oral, Q4H PRN, Setzer, Ed[place] Circle, PA-C .  levalbuterol (XOPENEX) nebulizer solution 1.25 mg, 1.25 mg, Nebulization, Q6H PRN, Florencia Reasons, MD, 1.25 mg at 02/04/20 1851 .   levothyroxine (SYNTHROID) tablet 50 mcg, 50 mcg, Oral, Daily, Laurence Slate M, PA-C, 50 mcg at 02/05/20 0544 .  metoprolol succinate (TOPROL-XL) 24 hr tablet 25 mg, 25 mg, Oral, Daily, Croitoru, Mihai, MD, 25 mg at 02/05/20 1026 .  metoprolol tartrate (LOPRESSOR) injection 2.5 mg, 2.5 mg, Intravenous, Q4H PRN, Laurence Slate M, PA-C, 2.5 mg at 01/25/20 1917 .  mirabegron ER (MYRBETRIQ) tablet 25 mg, 25 mg, Oral, Daily, Laurence Slate M, PA-C, 25 mg at 02/05/20 1026 .  multivitamin with minerals tablet 1 tablet, 1 tablet, Oral, Daily, Laurence Slate M, PA-C, 1 tablet at 01/31/20 0258 .  nitroGLYCERIN (NITROSTAT) SL tablet 0.4 mg, 0.4 mg, Sublingual, Q5 min PRN, Laurence Slate M, PA-C, 0.4 mg at 01/26/20 1358 .  pantoprazole (PROTONIX) EC tablet 40 mg, 40 mg, Oral, Daily, Laurence Slate M, PA-C, 40 mg at 02/05/20 1018 .  polyethylene glycol (MIRALAX / GLYCOLAX) packet 17 g, 17 g, Oral, BID, Florencia Reasons, MD, 17 g at 02/02/20 0925 .  polyvinyl alcohol (LIQUIFILM TEARS) 1.4 % ophthalmic solution 1 drop, 1 drop, Both Eyes, PRN, Collins, Emma M, PA-C .  predniSONE (DELTASONE) tablet 10 mg, 10 mg, Oral, Daily, Florencia Reasons, MD, 10 mg at 02/05/20 1018 .  saccharomyces boulardii (FLORASTOR) capsule 250 mg, 250 mg, Oral, BID, Florencia Reasons, MD, 250 mg at 02/03/20 2053 .  senna-docusate (Senokot-S) tablet 1 tablet, 1 tablet, Oral, BID, Florencia Reasons, MD, 1 tablet at 02/03/20 0827 .  Tiotropium Bromide Monohydrate AERS 2 puff, 2 puff, Inhalation, Daily, Alvira Philips, [place], 2 puff at 02/05/20 1029 .  vitamin B-12 (CYANOCOBALAMIN) tablet 1,000 mcg, 1,000 mcg, Oral, Daily, Laurence Slate M, PA-C, 1,000 mcg at 02/05/20 1018   Patients Current Diet:     Diet Order                      Diet - low sodium heart healthy  Diet Heart Room service appropriate? Yes; Fluid consistency: Thin; Fluid restriction: 1500 mL Fluid  Diet effective now                      Precautions / Restrictions Precautions Precautions:  Fall,Other (comment) Precaution Comments: H/o R AKA (s/p revision 1/18) Other Brace: had post op shoe prior to R AKA revision Restrictions Weight Bearing Restrictions: Yes RLE Weight Bearing: Non weight bearing    Has the patient had 2 or more falls or a fall with injury in the past year? Yes   Prior Activity Level Household: per wife, was mod I for mobility at w/c level after last rehab admission, not going out (virtual doctor's visits, etc), and needed some assist for LB ADLs. Limited Community (1-2x/wk): only going out for appointments, using wc for primary mobility, able to transfer without assistance (mod I per wife)   Prior Functional Level Self Care: Did the patient need help bathing, dressing, using the toilet or eating? Needed some help   Indoor Mobility: Did the patient need assistance with walking from room to room (with or without device)? Mod I w/c level mobility.    Stairs: Did the patient need assistance with internal or external stairs (with or without device)? Dependent   Functional Cognition: Did the patient need help planning regular tasks such as shopping or remembering to take medications? Needed some help   Home Assistive Devices / Equipment Home Assistive Devices/Equipment: Wheelchair (triloghy) Home Equipment: Walker - 2 wheels,Cane - single point,Shower seat,Transport chair,Tub bench   Prior Device Use: Indicate devices/aids used by the patient prior to current illness, exacerbation or injury? Manual wheelchair, Walker and stair lift, tub bench, BSC   Current Functional Level Cognition   Overall Cognitive Status: Impaired/Different from baseline Current Attention Level: Selective Orientation Level: Oriented X4 Following Commands: Follows one step commands consistently,Follows one step commands with increased time General Comments: WFL for simple tasks. Apparent slowed processing at times, potentially exacerbated by fatigue and HOH. Pt stating "I can't"  before trying to attempt mobility    Extremity Assessment (includes Sensation/Coordination)   Upper Extremity Assessment: Generalized weakness  Lower Extremity Assessment: Defer to PT evaluation RLE Deficits / Details: Bandage intact on right residual limb LLE Deficits / Details: Redness present distal LLE and into foot; small sore on heel LLE Sensation: decreased light touch     ADLs   Overall ADL's : Needs assistance/impaired Eating/Feeding: Set up,Independent,Sitting Grooming: Wash/dry hands,Wash/dry face,Brushing hair,Min guard,Sitting Upper Body Bathing: Min guard,Sitting Lower Body Bathing: Maximal assistance,Sitting/lateral leans Upper Body Dressing : Min guard,Sitting Lower Body Dressing: Maximal assistance,Sitting/lateral leans Toilet Transfer: Moderate assistance,RW,Stand-pivot,Cueing for safety Toilet Transfer Details (indicate cue type and reason): simulated Toileting- Clothing Manipulation and Hygiene: Total assistance Functional mobility during ADLs: Moderate assistance,Cueing for safety,Rolling walker     Mobility   Overal bed mobility: Needs Assistance Bed Mobility: Sit to Supine Rolling: Mod assist Supine to sit: Min guard,HOB elevated Sit to supine: Min guard,HOB elevated General bed mobility comments: received sitting EOB finishing dinner.     Transfers   Overall transfer level: Needs assistance Equipment used: 1 person hand held assist,Rolling walker (2 wheeled) Transfers: Lateral/Scoot Transfers Sit to Stand: Mod assist Stand pivot transfers: Min guard Squat pivot transfers: Mod assist  Lateral/Scoot Transfers: Min assist General transfer comment: along EOB toward HOB to simulate slide board transfer. pt able to lift hips with BUE support to scoot hips fairly well, minA at times for transfer pad assist;  pt deferring to stand due to heartburn/just ate and mild nausea     Ambulation / Gait / Stairs / Wheelchair Mobility   Ambulation/Gait General Gait  Details: deferred due to lethargy and need for assist for squat pivot     Posture / Balance Dynamic Sitting Balance Sitting balance - Comments: seated EOB ~10 mins with no LOB, occasional cues for upright posture needed Balance Overall balance assessment: History of Falls,Needs assistance Sitting-balance support: No upper extremity supported Sitting balance-Leahy Scale: Fair Sitting balance - Comments: seated EOB ~10 mins with no LOB, occasional cues for upright posture needed Standing balance-Leahy Scale: Poor Standing balance comment: Reliant on UE support and external assist     Special needs/care consideration CPAP, Oxygen 2L, Skin R AKA revision, chronic wounds to L foot, Diabetic management yes and Behavioral consideration dementia    Previous Home Environment (from acute therapy documentation) Living Arrangements: Spouse/significant other Available Help at Discharge: Family,Available 24 hours/day Type of Home: House Home Layout: Multi-level,Bed/bath upstairs Alternate Level Stairs-Rails: Right Alternate Level Stairs-Number of Steps: Enters from carport with level entry. 9 to kitchen/dining room/living room. 6 additional steps to bedroom/bathroom with 3 bedrooms and 2 bathrooms with 1 rail with each set of stairs. Home Access: Level entry Bathroom Shower/Tub: Walk-in shower,Tub/shower unit Bathroom Toilet: Standard Bathroom Accessibility: Yes Home Care Services: Yes Type of Home Care Services: Home PT,Home [place] (if known): [facility] Additional Comments: O2 2L, stair lift   Discharge Living Setting Plans for Discharge Living Setting: Patient's home Type of Home at Discharge: House Discharge Home Layout: Multi-level,Bed/bath upstairs Alternate Level Stairs-Rails:  (stair lift) Discharge Home Access: Level entry (from car port) Discharge Bathroom Shower/Tub: Tub/shower unit,Walk-in shower Discharge Bathroom Toilet: Standard Discharge Bathroom  Accessibility: Yes How Accessible: Accessible via wheelchair Does the patient have any problems obtaining your medications?: No   Social/Family/Support Systems Patient Roles: Spouse Anticipated Caregiver: Teal Bontrager (spouse) Anticipated Caregiver's Contact Information: [phone] Ability/Limitations of Caregiver: min guard for mobility, mod for ADLs Caregiver Availability: 24/7 Discharge Plan Discussed with Primary Caregiver: Yes Is Caregiver In Agreement with Plan?: Yes Does Caregiver/Family have Issues with Lodging/Transportation while Pt is in Rehab?: No   Goals Patient/Family Goal for Rehab: PT supervision w/c level, OT supervision to min assist for ADLs Expected length of stay: 9-12 days Additional Information: Pt with admission to CIR in 08/2019 with d/c to SNF Pt/Family Agrees to Admission and willing to participate: Yes Program Orientation Provided & Reviewed with Pt/Caregiver Including Roles  & Responsibilities: Yes   Decrease burden of Care through IP rehab admission: n/a   Possible need for SNF placement upon discharge: Potentially.  Pt with supervision mobility goals, however did d/c to SNF last admission due to need for fluctuating levels of assist for mobility   Patient Condition: I have reviewed medical [name] from [facility], spoken with CM, and spouse. I discussed via phone for inpatient rehabilitation assessment.  Patient will benefit from ongoing PT and OT, can actively participate in 3 hours of therapy a day 5 days of the week, and can make measurable gains during the admission.  Patient will also benefit from the coordinated team approach during an Inpatient Acute Rehabilitation admission.  The patient will receive intensive therapy as well as Rehabilitation physician, nursing, social worker, and care management interventions.  Due to safety, skin/wound care, disease management, medication administration, pain management and patient education the patient  requires 24 hour a day rehabilitation nursing.  The patient is currently  min to mod assist with mobility and basic ADLs.  Discharge setting and therapy post discharge at home with home health is anticipated.  Patient has agreed to participate in the Acute Inpatient Rehabilitation Program and will admit today.   Preadmission Screen Completed By:  Michel Santee, PT, DPT 02/05/2020 10:42 AM ______________________________________________________________________   Discussed status with Dr. Dagoberto Ligas on 02/05/20  at 10:59 AM  and received approval for admission today.   Admission Coordinator:  Michel Santee, PT, DPT time 10:59 AM Sudie Grumbling 02/05/20     Assessment/Plan: Diagnosis: 1. Does the need for close, 24 hr/day Medical supervision in concert with the patient's rehab needs make it unreasonable for this patient to be served in a less intensive setting? Yes 2. Co-Morbidities requiring supervision/potential complications: R AKA revision, LLE cellutlitis- Afib with RVR, ABLA, chronic foley 3. Due to bladder management, bowel management, safety, skin/wound care, disease management, medication administration, pain management and patient education, does the patient require 24 hr/day rehab nursing? Yes 4. Does the patient require coordinated care of a physician, rehab nurse, PT, OT, and SLP to address physical and functional deficits in the context of the above medical diagnosis(es)? Yes Addressing deficits in the following areas: balance, endurance, locomotion, strength, transferring, bathing, dressing, feeding, grooming and toileting 5. Can the patient actively participate in an intensive therapy program of at least 3 hrs of therapy 5 days a week? Yes 6. The potential for patient to make measurable gains while on inpatient rehab is good 7. Anticipated functional outcomes upon discharge from inpatient rehab: supervision and min assist PT, supervision and min assist OT, n/a SLP 8. Estimated rehab length of  stay to reach the above functional goals is: 9-12 days 9. Anticipated discharge destination: Home 10. Overall Rehab/Functional Prognosis: good     MD Signature:

## 2020-02-05 NOTE — Progress Notes (Signed)
Progress Note  Patient Name: [name] Date of Encounter: 02/05/2020  [facility] Cardiologist: Sinclair Grooms, MD   Subjective   No cardiovascular complaints. Brisk diuresis despite holding furosemide yesterday. Na w improving trend. Renal parameters are normal. No further arrhythmia  Inpatient Medications    Scheduled Meds: . amoxicillin-clavulanate  1 tablet Oral Q12H  . aspirin EC  81 mg Oral Daily  . atorvastatin  40 mg Oral QHS  . budesonide-formoterol  2 puff Inhalation BID  . Chlorhexidine Gluconate Cloth  6 each Topical Daily  . cholecalciferol  2,000 Units Oral Q lunch  . collagenase   Topical Daily  . enoxaparin (LOVENOX) injection  30 mg Subcutaneous Q12H  . feeding supplement  1 Container Oral TID BM  . finasteride  5 mg Oral Q lunch  . fluticasone  2 spray Each Nare Daily  . guaiFENesin  600 mg Oral BID  . levothyroxine  50 mcg Oral Daily  . metoprolol succinate  25 mg Oral Daily  . mirabegron ER  25 mg Oral Daily  . multivitamin with minerals  1 tablet Oral Daily  . pantoprazole  40 mg Oral Daily  . polyethylene glycol  17 g Oral BID  . predniSONE  10 mg Oral Daily  . saccharomyces boulardii  250 mg Oral BID  . senna-docusate  1 tablet Oral BID  . Tiotropium Bromide Monohydrate  2 puff Inhalation Daily  . vitamin B-12  1,000 mcg Oral Daily   Continuous Infusions:  PRN Meds: acetaminophen, ALPRAZolam, alum & mag hydroxide-simeth, bisacodyl, calcium carbonate, HYDROcodone-acetaminophen, levalbuterol, metoprolol tartrate, nitroGLYCERIN, polyvinyl alcohol   Vital Signs    Vitals:   02/04/20 1452 02/04/20 2104 02/05/20 0420 02/05/20 0500  BP: 113/62 128/61 (!) 103/54   Pulse: 82 88 74   Resp: 14 16 18    Temp: 98.3 F (36.8 C) 97.9 F (36.6 C) (!) 97.5 F (36.4 C)   TempSrc: Oral Oral Oral   SpO2: 100% 100% 98%   Weight:    52.7 kg  Height:        Intake/Output Summary (Last 24 hours) at 02/05/2020 1245 Last data filed at 02/05/2020  0500 Gross per 24 hour  Intake 720 ml  Output 2701 ml  Net -1981 ml   Last 3 Weights 02/05/2020 02/03/2020 01/30/2020  Weight (lbs) 116 lb 2.9 oz 114 lb 13.8 oz 115 lb 15.4 oz  Weight (kg) 52.7 kg 52.1 kg 52.6 kg      Telemetry    Sinus rhythm- Personally Reviewed  ECG    No new tracing- Personally Reviewed  Physical Exam  Elderly and frail GEN: No acute distress.   Neck: No JVD Cardiac: RRR, no murmurs, rubs, or gallops.  Respiratory: Clear to auscultation bilaterally. GI: Soft, nontender, non-distended  MS: No edema; status post right above-the-knee amputation. Neuro:  Nonfocal  Psych: Normal affect   Labs    High Sensitivity Troponin:   Recent Labs  Lab 01/26/20 1406 01/26/20 1536  TROPONINIHS 107* 115*      Chemistry Recent Labs  Lab 01/30/20 0223 01/31/20 0225 02/03/20 0339 02/04/20 0129 02/05/20 0054  NA 130*   < > 125* 126* 127*  K 4.1   < > 4.3 4.1 4.4  CL 94*   < > 88* 88* 90*  CO2 25   < > 30 30 27   GLUCOSE 111*   < > 95 85 99  BUN 21   < > 20 18 15   CREATININE  1.07   < > 0.96 1.02 0.93  CALCIUM 8.7*   < > 8.7* 8.7* 8.6*  PROT 4.6*  --   --   --   --   ALBUMIN 2.9*  --   --   --   --   AST 24  --   --   --   --   ALT 15  --   --   --   --   ALKPHOS 44  --   --   --   --   BILITOT 0.7  --   --   --   --   GFRNONAA >60   < > >60 >60 >60  ANIONGAP 11   < > 7 8 10    < > = values in this interval not displayed.     Hematology Recent Labs  Lab 02/03/20 0339 02/04/20 0129 02/05/20 0054  WBC 7.2 7.7 7.7  RBC 2.66* 2.69* 2.76*  HGB 8.4* 8.5* 8.8*  HCT 25.0* 25.3* 25.9*  MCV 94.0 94.1 93.8  MCH 31.6 31.6 31.9  MCHC 33.6 33.6 34.0  RDW 13.6 13.5 13.4  PLT 213 222 246    BNPNo results for input(s): BNP, PROBNP in the last 168 hours.   DDimer No results for input(s): DDIMER in the last 168 hours.   Radiology    No results found.  Cardiac Studies    ECHO 01/28/2020  1. Left ventricular ejection fraction, by estimation, is 60  to 65%. The  left ventricle has normal function. The left ventricle has no regional  wall motion abnormalities. There is mild concentric left ventricular  hypertrophy. Left ventricular diastolic  parameters are indeterminate.  2. Right ventricular systolic function is normal. The right ventricular  size is normal.  3. The mitral valve is normal in structure. No evidence of mitral valve  regurgitation. No evidence of mitral stenosis.  4. The aortic valve is tricuspid. There is mild calcification of the  aortic valve. There is mild thickening of the aortic valve. Aortic valve  regurgitation is not visualized. Mild aortic valve sclerosis is present,  with no evidence of aortic valve  stenosis.  5. The inferior vena cava is normal in size with greater than 50%  respiratory variability, suggesting right atrial pressure of 3 mmHg.   NM Myoview 2019: IMPRESSION: 1. Large infarct involving the inferior wall including the inferior apex. Difficult to exclude peri-infarct ischemia at the apex and recommend correlation with ECG testing.  2. Abnormal wall motion along the inferior wall base and septal wall as described.  3. Left ventricular ejection fraction is 48%.  4. Non invasive risk stratification*: Intermediate  Patient Profile     87 y.o. male with CAD s/p CABG 2003 (inferior scar w peri-infarct ischemia on last nuclear study 2019), chronic HF w preserved LVEF, advanced PAD, COPD on chronic home O2 2l [place], HTN s/p R AKA on 01/27/2020, now with new onset atrial flutter-to-atrial fibrillation w RVR on 02/02/2020  Assessment & Plan    1. AFib: A single 8 h episode of typical flutter, transitioning to atrial fibrillation then again a flutter, before spontaneous conversion. He is a less than optimal candidate for long-term DOAC. Increased beta blocker dose. Plan OP 30-day event monitor. 2. CAD: suspect he has tachycardia-related angina, but better even with incomplete rate control.  Beta blocker preferred agent. Not a good candidate for invasive coronary evaluation. Would not be surprised if hsTrop I abnormal w tachyarrhythmia, but will not check unless  symptoms occur outside of tachycardia. No ST levation seen on ECG. 3. PAD s/p R AKA: starting PT. He was also offered L foot amputation, but declined. 4. CHF: no signs or symptoms of HF exacerbation so far. Preserved LVEF. 5. Hyponatremia:  Diuretics on hold since yesterday. Na seems to be getting better. Hold off diuretics one more day. 6. COPD: no wheezing - watch for increased wheezing with higher beta blocker doses.         For questions or updates, please contact [place] Please consult [email or web address] for contact info under        Signed, Sanda Klein, MD  02/05/2020, 8:21 AM

## 2020-02-05 NOTE — Progress Notes (Signed)
Inpatient Rehab Admissions Coordinator:   Covid test negative. I have a bed available for pt to admit to CIR today. Dr. Lupita Leash and Dr. Debby Bud in agreement.  Will let pt/family and TOC team know.   Shann Medal, PT, DPT Admissions Coordinator [phone] 02/05/20  10:39 AM

## 2020-02-05 NOTE — H&P (Signed)
Physical Medicine and Rehabilitation Admission H&P    No chief complaint on file. : HPI: [name] is a 87 year old right-handed male with history significant for chronic urinary retention with chronic indwelling Foley catheter tube followed by [name] of urology services last changed 01/16/2020, hypertension, hyperlipidemia, CAD with CABG 2003 followed by cardiology services Dr. Daneen Schick, COPD 2 L oxygen, peripheral vascular disease, diastolic congestive heart failure, sacral decubitus ulcer, right AKA 08/11/2019 as well as history of chronic wounds to lower extremities receiving inpatient rehab services 08/21/2019 to 09/01/2019 and was discharged to skilled nursing facility.  Presented 01/24/2020 with left lower extremity cellulitis he had been seeing his primary care provider maintained on antibiotics.  He was sent to the emergency department for possible admission for cellulitis and initially sent home on doxycycline.  He was later seen at the wound clinic for his left foot continued to worsen despite antibiotics.  Vascular surgery follow-up Dr. Donzetta Matters and on examination was noted to have open wound of the right AKA stump with exposed bone.  X-rays obtained showing some demineralization but no osteomyelitis.  MRI was ordered to evaluate osteomyelitis showing no areas of abnormal marrow signal or cortical destruction.  Patient did undergo revision of right AKA 01/27/2020 per Dr. Donzetta Matters.  Work-up of left lower extremity cellulitis MRSA screen negative blood cultures no growth he did complete 7 days antibiotics but left foot looked worse ESR/CRP slightly elevated placed on Augmentin 01/29/2020 to be completed 02/05/2020 with improvement in latest WBC 7.7..  Patient had declined amputation of left foot.  Hospital course patient has had some hyponatremia 130-126.  Cardiology services consulted during hospital stay for episodic chest pain/atrial flutter with RVR of new onset.  Placed on Toprol not a candidate  for long-term DOAC.  30-day event monitor planned at discharge.  Currently holding Lasix and Aldactone due to hyponatremia and resume as needed.  Palliative care was consulted to establish goals of care.  Patient with decubitus ulcer buttocks Santyl ointment twice daily with wound care nurse follow-up.  Random Covid testing 02/04/2020 negative.  Therapy evaluations completed due to patient decrease in functional ability was admitted for a comprehensive rehab program.  Pt very sedated today- wife was concerned was due to a medication- Increase in Toprol or Myrbetriq- only increase I saw was done on 1/25- so think the lack of sleep last night/restless night much more likely.   Also having intermittent bladder spasms from foley/got irrigated and has resolved.  Wife wants to hold Myrbetriq tonight (was changing to night time) and see how things go.  Only taking Tylenol for pain, not the Norco that's ordered.  LBM yesterday- soft/loose per pt due to meds, but was large.    Review of Systems  Constitutional: Negative for chills and fever.  HENT: Negative for hearing loss.   Eyes: Negative for blurred vision and double vision.  Respiratory: Positive for shortness of breath.   Cardiovascular: Positive for chest pain, palpitations and leg swelling.  Gastrointestinal: Negative for constipation, heartburn, nausea and vomiting.       GERD  Genitourinary: Negative for hematuria.       Chronic urinary retention with chronic Foley tube  Musculoskeletal: Positive for joint pain and myalgias.  Skin: Negative for rash.  Psychiatric/Behavioral:       Anxiety  All other systems reviewed and are negative.  Past Medical History:  Diagnosis Date  . Anxiety   . Arthritis    "generalized" (04/04/2017)  . Asthma   .  Basal cell carcinoma    "left ear"  . BPH (benign prostatic hyperplasia)    severe; s/p multiple biopsies  . CAD (coronary artery disease)   . Carotid artery occlusion   . Chronic respiratory  failure ([place])   . Chronic rhinitis   . COPD (chronic obstructive pulmonary disease) ([facility])    2L [place] O2  . Diastolic heart failure ([place]) 2019  . Dilation of biliary tract   . Elevated troponin 03/09/2017  . Gallstones   . GERD (gastroesophageal reflux disease)   . History of blood transfusion    "w/his CABG" (04/04/2017)  . History of kidney stones   . Hyperlipidemia   . Hypertension   . On home oxygen therapy    "~ 24/7" (04/04/2017)  . Peripheral vascular disease ([facility])   . Pneumonia 2019  . Squamous cell carcinoma of skin 04/23/2013   in situ-Right flank (txpbx)  . Squamous cell carcinoma of skin 01/23/2017   [place]/ in siut- Left ear rim (CX3FU)  . Syncope and collapse   . Ureteral tumor 08/2015   had endoscopic procedure for evaluation, unable to reach for biopsy  . Urinary catheter (Foley) change required    Past Surgical History:  Procedure Laterality Date  . ABDOMINAL AORTOGRAM W/LOWER EXTREMITY Bilateral 06/30/2019   Procedure: ABDOMINAL AORTOGRAM W/LOWER EXTREMITY;  Surgeon: Waynetta Sandy, MD;  Location: [facility];  Service: Cardiovascular;  Laterality: Bilateral;  . AMPUTATION Right 08/11/2019   Procedure: RIGHT ABOVE KNEE AMPUTATION;  Surgeon: Angelia Mould, MD;  Location: [facility];  Service: Vascular;  Laterality: Right;  . AMPUTATION Right 01/27/2020   Procedure: RIGHT AMPUTATION ABOVE KNEE REVISION;  Surgeon: Waynetta Sandy, MD;  Location: [facility];  Service: Vascular;  Laterality: Right;  . BASAL CELL CARCINOMA EXCISION Left    ear  . CARDIAC CATHETERIZATION     "prior to bypass"  . CATARACT EXTRACTION W/ INTRAOCULAR LENS  IMPLANT, BILATERAL Bilateral   . CORONARY ARTERY BYPASS GRAFT  10/2001   LIMA to LAD, SVG to OM1-2, SVG to RCA and PDA  . LITHOTRIPSY    . PROSTATE BIOPSY  Oct. 2014  . TONSILLECTOMY     Family History  Problem Relation Age of Onset  . Heart disease Mother   . Heart disease Father        Before age 34  .  Breast cancer Mother   . Cancer Mother        Breast cancer  . Hypertension Mother   . Other Mother        AAA  and   Amputation  . Heart attack Father   . Stroke Brother        x3, still living   . Peripheral vascular disease Brother   . Heart disease Brother   . Hyperlipidemia Brother   . Hypertension Brother   . Heart disease Brother   . Allergies Brother    Social History:  reports that he quit smoking about 37 years ago. His smoking use included cigarettes. He has a 66.00 pack-year smoking history. He has never used smokeless tobacco. He reports current alcohol use. He reports that he does not use drugs. Allergies:  Allergies  Allergen Reactions  . Cefdinir Diarrhea and Other (See Comments)    Severe Diarrhea  . Nitrofurantoin Swelling and Other (See Comments)    Hand Swelling  . Sulfa Antibiotics Anaphylaxis and Swelling  . Sulfonamide Derivatives Swelling and Other (See Comments)    Facial/tongue swelling  .  Tape Other (See Comments)    SKIN IS VERY THIN AND TEARS EASILY!!!!! Please do NOT use "plastic" tape- USE PAPER!!  . Ciprofloxacin Itching, Rash and Other (See Comments)    Red itchy hands  . Nitrofurantoin Swelling and Other (See Comments)    Swollen hands  . Amoxicillin Er Other (See Comments)    Frequest urination. Pt reports "not really" allergic 01/24/20  . Doxycycline Other (See Comments)    "Felt terrible" Pt wife reports "not really" allergic 01/24/20  . Levaquin [Levofloxacin] Itching and Rash  . Sertraline Anxiety and Other (See Comments)    Makes the patient jittery   Medications Prior to Admission  Medication Sig Dispense Refill  . acetaminophen (TYLENOL) 500 MG tablet Take 500 mg by mouth every 6 (six) hours as needed.    . ALPRAZolam (XANAX) 0.5 MG tablet Take 1 tablet (0.5 mg total) by mouth 3 (three) times daily. (Patient taking differently: Take 0.5 mg by mouth 3 (three) times daily as needed.) 90 tablet 2  . amoxicillin-clavulanate (AUGMENTIN)  875-125 MG tablet Take 1 tablet by mouth every 12 (twelve) hours for 1 day. 2 tablet 0  . aspirin 81 MG tablet Take 81 mg by mouth daily.    Marland Kitchen atorvastatin (LIPITOR) 20 MG tablet TAKE 1 TABLET BY MOUTH EVERY DAY (Patient taking differently: Take 20 mg by mouth at bedtime.) 90 tablet 2  . calcium carbonate (TUMS - DOSED IN MG ELEMENTAL CALCIUM) 500 MG chewable tablet Chew 1 tablet (200 mg of elemental calcium total) by mouth 2 (two) times daily as needed for indigestion or heartburn.    . Cholecalciferol (VITAMIN D3) 2000 units TABS Take 2,000 Units by mouth daily with lunch.     . famotidine (PEPCID) 20 MG tablet Take 20 mg by mouth at bedtime as needed.    . finasteride (PROSCAR) 5 MG tablet Take 5 mg by mouth daily with lunch.     . fluticasone (FLONASE) 50 MCG/ACT nasal spray Place 2 sprays into both nostrils daily.    Marland Kitchen guaiFENesin (MUCINEX) 600 MG 12 hr tablet Take 1 tablet (600 mg total) by mouth 2 (two) times daily.    Marland Kitchen levalbuterol (XOPENEX HFA) 45 MCG/ACT inhaler Inhale 2 puffs into the lungs every 4 (four) hours as needed for wheezing or shortness of breath. 15 g 5  . levalbuterol (XOPENEX) 0.63 MG/3ML nebulizer solution Take 3 mLs (0.63 mg total) by nebulization every 4 (four) hours as needed for wheezing or shortness of breath. 120 mL 3  . levothyroxine (SYNTHROID) 50 MCG tablet Take 50 mcg by mouth daily.    . metoprolol succinate (TOPROL-XL) 25 MG 24 hr tablet Take 1 tablet (25 mg total) by mouth daily.    . Multiple Vitamins-Minerals (PRESERVISION AREDS 2) CAPS Take 1 capsule by mouth daily.    Marland Kitchen MYRBETRIQ 25 MG TB24 tablet Take 1 tablet (25 mg total) by mouth daily. 30 tablet   . NITROSTAT 0.4 MG SL tablet Place 0.4 mg under the tongue every 5 (five) minutes as needed for chest pain.    . OXYGEN Inhale 2 L/min into the lungs continuous.     . pantoprazole (PROTONIX) 40 MG tablet TAKE 1 TABLET(40 MG) BY MOUTH DAILY 30 TO 60 MINUTES BEFORE FIRST MEAL OF THE DAY 30 tablet 3  .  Polyethyl Glycol-Propyl Glycol (SYSTANE) 0.4-0.3 % SOLN Apply 1 drop to eye 2 (two) times daily as needed (eye irritation).  0  . polyethylene glycol (MIRALAX / GLYCOLAX) 17  g packet Take 17 g by mouth daily. (Patient not taking: No sig reported) 14 each 0  . predniSONE (DELTASONE) 10 MG tablet FOR FLARE OF COUGH/WHEEZING MAY INCREASE TO 2 DAILY BETTER, THAN 1 TABLET BY MOUTH EVERY DAY (Patient taking differently: Take 10 mg by mouth daily. If flare occurs, take 20 mg until better.) 100 tablet 2  . saccharomyces boulardii (FLORASTOR) 250 MG capsule Take 1 capsule (250 mg total) by mouth 2 (two) times daily for 7 days. 14 capsule 0  . senna-docusate (SENOKOT-S) 8.6-50 MG tablet Take 1 tablet by mouth 2 (two) times daily.    . sodium chloride (MURO 128) 5 % ophthalmic solution Place 1 drop into both eyes 4 (four) times daily. 15 mL   . SYMBICORT 160-4.5 MCG/ACT inhaler Inhale 2 puffs into the lungs 2 (two) times daily. 1 each 5  . Tiotropium Bromide Monohydrate (SPIRIVA RESPIMAT) 2.5 MCG/ACT AERS INHALE 2 PUFFS BY MOUTH EVERY DAY (Patient taking differently: Inhale 2 puffs into the lungs daily.) 4 g 11  . vitamin B-12 (CYANOCOBALAMIN) 1000 MCG tablet Take 1,000 mcg by mouth daily.     . Zinc Oxide (TRIPLE PASTE) 12.8 % ointment Apply topically as needed for irritation. To gluteal cleft/buttocks (Patient not taking: No sig reported) 56.7 g 0    Drug Regimen Review Drug regimen was reviewed and remains appropriate with no significant issues identified  Home:     Functional History:    Functional Status:  Mobility:          ADL:    Cognition:      Physical Exam: Blood pressure (!) 109/57, pulse 77, temperature 98.1 F (36.7 C), resp. rate 16, SpO2 100 %. Physical Exam Vitals and nursing note reviewed. Exam conducted with a chaperone present.  Constitutional:      Comments: Pt sitting up in bedside chair (refused ot get back in bed to assess sacrum)- wife at bedside, on O2 by [place]  2L- sats usually ~ 95%, sleepy, but was coherent, NAD  HENT:     Head: Normocephalic and atraumatic.     Comments: Smile equal- VERY HOH!    Right Ear: External ear normal.     Left Ear: External ear normal.     Nose: Nose normal. No congestion.     Mouth/Throat:     Mouth: Mucous membranes are dry.     Pharynx: Oropharynx is clear. No oropharyngeal exudate.  Eyes:     General:        Right eye: No discharge.        Left eye: No discharge.     Extraocular Movements: Extraocular movements intact.  Cardiovascular:     Rate and Rhythm: Normal rate and regular rhythm.     Heart sounds: Normal heart sounds. No murmur heard.   Pulmonary:     Comments: CTA B/L- no W/R/R- good air movement  Abdominal:     Comments: Soft, NT, ND, (+)BS -hypoactive  Genitourinary:    Comments: Chronic foley- medium amber urine- some sediment noted Musculoskeletal:     Cervical back: Normal range of motion. No rigidity.     Comments: UEs- 5-/5 in biceps, triceps, WE, grip and finger abd B/L RLE- HF at least 3/5- but painful- very edematous R AKA- very short AKA LLE- HF 4/5, KE 4/5, DF 4-/5, PF 4/5  c/o pain in biceps/triceps due to exercising.   Skin:    Comments: Surgical dressing sites in place.  Sacral decubitus not examined as patient  was sitting up in chair  Ecchymoses on B/L arms L forearm IV_ ok sacrl wound- per wife 1/2 inch- using santyl per wife- pt didn't want to get back in bed to examine today L foot has multiple flaky scabs near toes, skin very dry and flaking Very bruised over R AKA- swollen- draining from lateral aspect somewhat- staples intact  Neurological:     Comments: Patient is alert frail in no acute distress.  Oriented to person and place. Poor medical historian  Sleepy- kept falling asleep Said sensation to light touch intact on UEs, LLE and R AKA  Psychiatric:     Comments: Flat, quiet- wife does most of talking       Results for orders placed or performed during  the hospital encounter of 01/23/20 (from the past 48 hour(s))  CBC     Status: Abnormal   Collection Time: 02/04/20  1:29 AM  Result Value Ref Range   WBC 7.7 4.0 - 10.5 K/uL   RBC 2.69 (L) 4.22 - 5.81 MIL/uL   Hemoglobin 8.5 (L) 13.0 - 17.0 g/dL   HCT 25.3 (L) 39.0 - 52.0 %   MCV 94.1 80.0 - 100.0 fL   MCH 31.6 26.0 - 34.0 pg   MCHC 33.6 30.0 - 36.0 g/dL   RDW 13.5 11.5 - 15.5 %   Platelets 222 150 - 400 K/uL   nRBC 0.0 0.0 - 0.2 %    Comment: Performed at [facility], [place] [address] [place], [place] [address]  Basic metabolic panel     Status: Abnormal   Collection Time: 02/04/20  1:29 AM  Result Value Ref Range   Sodium 126 (L) 135 - 145 mmol/L   Potassium 4.1 3.5 - 5.1 mmol/L   Chloride 88 (L) 98 - 111 mmol/L   CO2 30 22 - 32 mmol/L   Glucose, Bld 85 70 - 99 mg/dL    Comment: Glucose reference range applies only to samples taken after fasting for at least 8 hours.   BUN 18 8 - 23 mg/dL   Creatinine, Ser 1.02 0.61 - 1.24 mg/dL   Calcium 8.7 (L) 8.9 - 10.3 mg/dL   GFR, Estimated >60 >60 mL/min    Comment: (NOTE) Calculated using the CKD-EPI Creatinine Equation (2021)    Anion gap 8 5 - 15    Comment: Performed at [facility] [address], [place], [place] [address]  SARS CORONAVIRUS 2 (TAT 6-24 HRS) Nasopharyngeal Nasopharyngeal Swab     Status: None   Collection Time: 02/04/20 11:32 AM   Specimen: Nasopharyngeal Swab  Result Value Ref Range   SARS Coronavirus 2 NEGATIVE NEGATIVE    Comment: (NOTE) SARS-CoV-2 target nucleic acids are NOT DETECTED.  The SARS-CoV-2 RNA is generally detectable in upper and lower respiratory specimens during the acute phase of infection. Negative results do not preclude SARS-CoV-2 infection, do not rule out co-infections with other pathogens, and should not be used as the sole basis for treatment or other patient management decisions. Negative results must be combined with clinical observations, patient history, and  epidemiological information. The expected result is Negative.  Fact Sheet for Patients: SugarRoll.be  Fact Sheet for Healthcare Providers: https://www.woods-mathews.com/  This test is not yet approved or cleared by the [place] FDA and  has been authorized for detection and/or diagnosis of SARS-CoV-2 by FDA under an Emergency Use Authorization (EUA). This EUA will remain  in effect (meaning this test can be used) for the duration  of the COVID-19 declaration under Se ction 564(b)(1) of the Act, 21 U.S.C. section 360bbb-3(b)(1), unless the authorization is terminated or revoked sooner.  Performed at [facility], [place] [address] [place], [place] [address]   CBC     Status: Abnormal   Collection Time: 02/05/20 12:54 AM  Result Value Ref Range   WBC 7.7 4.0 - 10.5 K/uL   RBC 2.76 (L) 4.22 - 5.81 MIL/uL   Hemoglobin 8.8 (L) 13.0 - 17.0 g/dL   HCT 25.9 (L) 39.0 - 52.0 %   MCV 93.8 80.0 - 100.0 fL   MCH 31.9 26.0 - 34.0 pg   MCHC 34.0 30.0 - 36.0 g/dL   RDW 13.4 11.5 - 15.5 %   Platelets 246 150 - 400 K/uL   nRBC 0.0 0.0 - 0.2 %    Comment: Performed at [facility], [place] [address], [place], [place] [address]  Basic metabolic panel     Status: Abnormal   Collection Time: 02/05/20 12:54 AM  Result Value Ref Range   Sodium 127 (L) 135 - 145 mmol/L   Potassium 4.4 3.5 - 5.1 mmol/L   Chloride 90 (L) 98 - 111 mmol/L   CO2 27 22 - 32 mmol/L   Glucose, Bld 99 70 - 99 mg/dL    Comment: Glucose reference range applies only to samples taken after fasting for at least 8 hours.   BUN 15 8 - 23 mg/dL   Creatinine, Ser 0.93 0.61 - 1.24 mg/dL   Calcium 8.6 (L) 8.9 - 10.3 mg/dL   GFR, Estimated >60 >60 mL/min    Comment: (NOTE) Calculated using the CKD-EPI Creatinine Equation (2021)    Anion gap 10 5 - 15    Comment: Performed at [facility] [address], [place], [place] [address]   *Note: Due to a large number of  results and/or encounters for the requested time period, some results have not been displayed. A complete set of results can be found in Results Review.   No results found.     Medical Problem List and Plan: 1.  Decreased functional mobility secondary to cellulitis left foot as well as right AKA revision 01/27/2020  -patient may  Shower with R AKA covered  -ELOS/Goals: ~ 2-2.5 weeks- goals supervision-min A 2.  Antithrombotics: -DVT/anticoagulation:Lovenox    -antiplatelet therapy: Aspirin 81 mg daily 3. Pain Management: Hydrocodone as needed- not taking- wife wants tylenol prn for pt 4. Mood: Provide emotional support  -antipsychotic agents: N/A 5. Neuropsych: This patient is capable of making decisions on his own behalf. 6. Skin/Wound decubitus to coccyx and R AKA.  Skin care as directed.  Routine skin checks- dressing to be changed BID and prn 7. Fluids/Electrolytes/Nutrition: Routine in and outs with follow-up chemistries 8.  ID/cellulitis left foot.  MRSA screen negative.  Blood cultures no growth to date.  Currently completing course of Augmentin 02/05/2020- now scabbed over- no open wound on L foot.  9.  Atrial flutter/A. fib with RVR.  Toprol 25 mg daily.  Not a candidate for long-term DOAC.  30-day event monitor as outpatient.  Follow-up cardiology services 10.  Hyponatremia.  Lasix and Aldactone currently on hold- per Cards for 1 MORE day.  Resume as needed.  Currently maintained on sodium chloride tablets 1 g twice daily 11.  Chronic diastolic congestive heart failure.  Lasix and Aldactone currently on hold followed by cardiology services- need to see/decide when to restart on Friday.  Monitor for  any signs of fluid overload 12.  Chronic respiratory failure with hypoxia/COPD.  Chronic oxygen therapy 2 L.  Continue chronic prednisone. 13.  Chronic urinary retention.  Chronic Foley tube followed by urology service [name].  Continue Myrbetriq as well as Proscar.  Change Foley  catheter to routinely- last changed 01/16/2020- .DO NOT REMOVE FOLEY- has been having sediment- if has bladder spasms, suggest flushing foley. Wants to hold Myrbetriq tonight to see if cause of sedation.  14.  GERD.  Protonix 15.  Chronic sacral decubitus.  Santyl ointment twice daily. Pt would not allow me to see today 16.  Hypothyroidism.  Synthroid 17.  Normocytic anemia.  Transfused 1 unit packed red blood cells 01/31/2020.  Follow-up CBC   Nelva Nay, PA-C 02/05/2020-   I have personally performed a face to face diagnostic evaluation of this patient and formulated the key components of the plan.  Additionally, I have personally reviewed laboratory data, imaging studies, as well as relevant notes and concur with the physician assistant's documentation above.   Courtney Heys, MD 02/05/2020

## 2020-02-05 NOTE — IPOC Note (Signed)
Individualized overall Plan of Care [facility]) Patient Details Name: [name] MRN: [id number] DOB: 1929-06-22  Admitting Diagnosis: Right above-knee amputee [facility])  Hospital Problems: Principal Problem:   Right above-knee amputee [facility]) Active Problems:   Normocytic anemia   Chronic diastolic congestive heart failure (HCC)   Major depressive disorder with current active episode   Paroxysmal atrial fibrillation ([place])     Functional Problem List: Nursing Pain,Bladder,Safety,Bowel,Edema,Endurance,Skin Integrity  PT Balance,Behavior,Endurance,Motor,Pain,Skin Integrity  OT Balance,Endurance,Motor,Pain,Safety  SLP    TR         Basic ADL's: OT Grooming,Bathing,Dressing,Toileting     Advanced  ADL's: OT       Transfers: PT Bed Mobility,Bed to [employer]  OT Toilet     Locomotion: PT Ambulation,Wheelchair Mobility,Stairs     Additional Impairments: OT None  SLP        TR      Anticipated Outcomes Item Anticipated Outcome  Self Feeding    Swallowing      Basic self-care  Supervision - Min A  Toileting  Min A   Bathroom Transfers Min A  Bowel/Bladder  to remain current of bowel  Transfers  supervision  Locomotion  CGA  Communication     Cognition     Pain  less than 3 out of 10  Safety/Judgment  to remain fall free while in rehab   Therapy Plan: PT Intensity: Minimum of 1-2 x/day ,45 to 90 minutes PT Frequency: 5 out of 7 days PT Duration Estimated Length of Stay: 7-10 days OT Intensity: Minimum of 1-2 x/day, 45 to 90 minutes OT Frequency: 5 out of 7 days OT Duration/Estimated Length of Stay: 7-10 days      Team Interventions: Nursing Interventions Patient/Family Education,Disease Management/Prevention,Skin Care/Wound Management,Discharge Planning,Bladder Management,Pain Management,Psychosocial Support,Bowel Management,Medication Management  PT interventions Ambulation/gait training,Discharge planning,Functional mobility  training,Psychosocial support,Therapeutic Activities,Balance/vestibular training,Disease management/prevention,Neuromuscular re-education,Skin care/wound Heritage manager propulsion/positioning,Cognitive remediation/compensation,DME/adaptive equipment instruction,Pain management,Splinting/orthotics,UE/LE Strength taining/ROM,Community reintegration,Functional electrical stimulation,Patient/family education,Stair training,UE/LE Coordination activities  OT Interventions Balance/vestibular training,Discharge planning,Disease Pharmacologist instruction,Functional mobility training,Pain management,Patient/family education,Psychosocial support,Self Care/advanced ADL retraining,Skin care/wound managment,Therapeutic Activities,Therapeutic Exercise,UE/LE Strength taining/ROM,Wheelchair propulsion/positioning  SLP Interventions    TR Interventions    SW/CM Interventions Discharge Planning,Psychosocial Support,Patient/Family Education   Barriers to Discharge MD  Medical stability, Wound care, Weight bearing restrictions, and Very HOH  Nursing      PT Inaccessible home environment,Home environment access/layout,Wound Care,Weight bearing restrictions,Behavior multiple sets of stairs in home (but pt has stair lift), NWB RLE, poor motivation  OT Wound Care    SLP      SW Decreased caregiver support Wife has health issues of her own   Team Discharge Planning: Destination: PT-Home ,OT- Home , SLP-  Projected Follow-up: PT-Home health PT, OT-  Home health OT,24 hour supervision/assistance, SLP-  Projected Equipment Needs: PT-To be determined, OT- None recommended by OT (has all needed equipment), SLP-  Equipment Details: PT-pt's wife reports pt will need new WC, OT-  Patient/family involved in discharge planning: PT- Patient,Family member/caregiver,  OT-Patient,Family member/caregiver, SLP-   MD ELOS: 7-10 days. Medical Rehab Prognosis:   Good Assessment: 87 year old right-handed male with history significant for chronic urinary retention with chronic indwelling Foley catheter tube followed by [name] of urology services last changed 01/16/2020, hypertension, hyperlipidemia, CAD with CABG 2003 followed by cardiology services Dr. Daneen Schick, COPD 2 L oxygen, peripheral vascular disease, diastolic congestive heart failure, sacral decubitus ulcer, right AKA 08/11/2019 as well as history of chronic wounds to lower extremities receiving inpatient rehab services 08/21/2019  to 09/01/2019 and was discharged to skilled nursing facility.  Presented 01/24/2020 with left lower extremity cellulitis he had been seeing his primary care provider maintained on antibiotics.  He was sent to the emergency department for possible admission for cellulitis and initially sent home on doxycycline.  He was later seen at the wound clinic for his left foot continued to worsen despite antibiotics.  Vascular surgery follow-up Dr. Donzetta Matters and on examination was noted to have open wound of the right AKA stump with exposed bone.  X-rays obtained showing some demineralization but no osteomyelitis.  MRI was ordered to evaluate osteomyelitis showing no areas of abnormal marrow signal or cortical destruction.  Patient did undergo revision of right AKA 01/27/2020 per Dr. Donzetta Matters.  Work-up of left lower extremity cellulitis MRSA screen negative blood cultures no growth he did complete 7 days antibiotics but left foot looked worse ESR/CRP slightly elevated placed on Augmentin 01/29/2020 to be completed 02/05/2020 with improvement in latest WBC 7.7. Patient had declined amputation of left foot.  Hospital course patient has had some hyponatremia.  Cardiology services consulted during hospital stay for episodic chest pain/atrial flutter with RVR of new onset.  Placed on Toprol not a candidate for long-term DOAC.  30-day event monitor planned at discharge.  Currently holding Lasix and Aldactone due to  hyponatremia and resume as needed.  Palliative care was consulted to establish goals of care.  Patient with decubitus ulcer buttocks Santyl ointment twice daily with wound care nurse follow-up.  Random Covid testing 02/04/2020 negative.  Patient with resulting functional deficits with mobility, transfers, endurance, self-care.  We will set goals for Supervision/Min A with PT/OT.   Due to the current state of emergency, patients may not be receiving their 3-hours of Medicare-mandated therapy.  See Team Conference Notes for weekly updates to the plan of care

## 2020-02-05 NOTE — PMR Pre-admission (Signed)
PMR Admission Coordinator Pre-Admission Assessment  Patient: [name] is an 87 y.o., male MRN: [id number] DOB: August 25, 1929 Height: _0  (172.7 cm) Weight: 52.7 kg  Insurance Information HMO:     PPO:      PCP:      IPA:      80/20:      OTHER:  PRIMARY: Medicare A and B      Policy#:[id number]             Subscriber: pt CM Name:       Phone#:     Fax#:  Pre-Cert#: verified oCivil engineer, contracting  Benefits:  Phone #:      Name:  Eff. Date: A and B 07/09/96     Deduct: $1556      Out of Pocket Max: n/a      Life Max:  CIR: 100%      SNF: 20 full days Outpatient: 80%     Co-Ins: 20% Home Health: 100%      Co-Pay:  DME: 80%     Co-Ins: 20% Providers:  SECONDARY:Holland Falling     Policy#: [id number]    Phone#: [phone] Financial Counselor:       Phone#:   The "Data Collection Information Summary" for patients in Inpatient Rehabilitation Facilities with attached "Privacy Act SBayou CaneRecords" was provided and verbally reviewed with: Family  Emergency Contact Information Contact Information    Name Relation Home Work Mobile   DTrentonSpouse   3ReganSon   [phone]  [name],[name] Daughter   [phone]     Current Medical History  Patient Admitting Diagnosis: L AKA revision, debility   History of Present Illness: DCharod [name] [name] a 87year old right-handed male with history significant for chronic urinary retention with chronic indwelling Foley, followed by Dr. EJunious Silkof urology services (foley last changed 01/16/2020), hypertension, hyperlipidemia, CAD with CABG 2003 followed by cardiology services Dr. HDaneen Schick COPD 2 L oxygen, peripheral vascular disease, diastolic congestive heart failure, sacral decubitus ulcer, right AKA 08/11/2019 receiving inpatient rehab services 08/21/2019 to 09/01/2019, and was discharged to skilled nursing facility.  Presented 01/24/2020 with left lower extremity cellulitis he had been seeing his primary care  provider and maintained on antibiotics.  He was sent to the emergency department for possible admission for cellulitis and initially sent home on doxycycline.  He was later seen at the wound clinic for his left foot continued to worsen despite antibiotics.  Vascular surgery follow-up Dr. CDonzetta Mattersand on examination was noted to have open wound of the right AKA stump with exposed bone.  X-rays obtained showing some demineralization but no osteomyelitis.  MRI was ordered to evaluate osteomyelitis showing no areas of abnormal marrow signal or cortical destruction.  Patient did undergo revision of right AKA 01/27/2020 per Dr. CDonzetta Matters  Work-up of left lower extremity cellulitis MRSA screen negative blood cultures no growth he did complete 7 days antibiotics but left foot looked worse. ESR/CRP slightly elevated placed on Augmentin 01/29/2020 to be completed 02/05/2020 with improvement in latest WBC 7.7.  Patient had declined amputation of left foot.  Hospital course patient has had some hyponatremia 130-126.  Cardiology services consulted during hospital stay for episodic chest pain/atrial flutter with RVR of new onset.  Placed on Toprol not a candidate for long-term DOAC.  30-day event monitor planned at discharge.  Currently holding Lasix and Aldactone due to hyponatremia and resume as needed.  Palliative care was consulted to establish goals of care.  Patient with decubitus ulcer buttocks. Santyl ointment twice daily with wound care nurse follow-up.  Random Covid testing 02/04/2020 negative.  Therapy evaluations completed due to patient decrease in functional ability and he was recommended for a comprehensive rehab program.    Patient's medical record from [name] has been reviewed by the rehabilitation admission coordinator and physician.  Past Medical History  Past Medical History:  Diagnosis Date  . Anxiety   . Arthritis    "generalized" (04/04/2017)  . Asthma   . Basal cell carcinoma    "left ear"  . BPH  (benign prostatic hyperplasia)    severe; s/p multiple biopsies  . CAD (coronary artery disease)   . Carotid artery occlusion   . Chronic respiratory failure ([place])   . Chronic rhinitis   . COPD (chronic obstructive pulmonary disease) (HCC)    2L [place] O2  . Diastolic heart failure ([place]) 2019  . Dilation of biliary tract   . Elevated troponin 03/09/2017  . Gallstones   . GERD (gastroesophageal reflux disease)   . History of blood transfusion    "w/his CABG" (04/04/2017)  . History of kidney stones   . Hyperlipidemia   . Hypertension   . On home oxygen therapy    "~ 24/7" (04/04/2017)  . Peripheral vascular disease ([place])   . Pneumonia 2019  . Squamous cell carcinoma of skin 04/23/2013   in situ-Right flank (txpbx)  . Squamous cell carcinoma of skin 01/23/2017   [place]/ in siut- Left ear rim (CX3FU)  . Syncope and collapse   . Ureteral tumor 08/2015   had endoscopic procedure for evaluation, unable to reach for biopsy  . Urinary catheter (Foley) change required     Family History   family history includes Allergies in his brother; Breast cancer in his mother; Cancer in his mother; Heart attack in his father; Heart disease in his brother, brother, father, and mother; Hyperlipidemia in his brother; Hypertension in his brother and mother; Other in his mother; Peripheral vascular disease in his brother; Stroke in his brother.  Prior Rehab/Hospitalizations Has the patient had prior rehab or hospitalizations prior to admission? Yes  Has the patient had major surgery during 100 days prior to admission? Yes   Current Medications  Current Facility-Administered Medications:  .  (feeding supplement) PROSource Plus liquid 30 mL, 30 mL, Oral, BID BM, Kc, Ramesh, MD .  acetaminophen (TYLENOL) tablet 650 mg, 650 mg, Oral, Q6H PRN, Starla Link, Kshitiz, MD, 650 mg at 02/05/20 1018 .  ALPRAZolam Duanne Moron) tablet 0.25 mg, 0.25 mg, Oral, TID PRN, Florencia Reasons, MD, 0.25 mg at 02/05/20 1022 .  alum & mag  hydroxide-simeth (MAALOX/MYLANTA) 200-200-20 MG/5ML suspension 15 mL, 15 mL, Oral, Q4H PRN, Florencia Reasons, MD .  amoxicillin-clavulanate (AUGMENTIN) 875-125 MG per tablet 1 tablet, 1 tablet, Oral, Q12H, Florencia Reasons, MD, 1 tablet at 02/05/20 1018 .  aspirin EC tablet 81 mg, 81 mg, Oral, Daily, Laurence Slate M, PA-C, 81 mg at 02/05/20 1018 .  atorvastatin (LIPITOR) tablet 40 mg, 40 mg, Oral, QHS, Pemberton, Greer Ee, MD, 40 mg at 02/04/20 2041 .  bisacodyl (DULCOLAX) suppository 10 mg, 10 mg, Rectal, Daily PRN, Florencia Reasons, MD .  budesonide-formoterol [facility]) 160-4.5 MCG/ACT inhaler 2 puff, 2 puff, Inhalation, BID, Alvira Philips, , 2 puff at 02/05/20 0805 .  calcium carbonate (TUMS - dosed in mg elemental calcium) chewable tablet 200 mg of elemental calcium, 1 tablet, Oral, BID PRN,  Albertine Grates, MD .  Chlorhexidine Gluconate Cloth 2 % PADS 6 each, 6 each, Topical, Daily, Lars Mage, PA-C, 6 each at 02/05/20 1019 .  cholecalciferol (VITAMIN D3) tablet 2,000 Units, 2,000 Units, Oral, Q lunch, Clinton Gallant M, [place], 2,000 Units at 02/04/20 1313 .  collagenase (SANTYL) ointment, , Topical, Daily, Lars Mage, [place], Given at 02/05/20 1019 .  enoxaparin (LOVENOX) injection 30 mg, 30 mg, Subcutaneous, Q12H, Croitoru, Mihai, MD, 30 mg at 02/05/20 1019 .  finasteride (PROSCAR) tablet 5 mg, 5 mg, Oral, Q lunch, Wisner, Emma M, [place], 5 mg at 02/04/20 1313 .  fluticasone (FLONASE) 50 MCG/ACT nasal spray 2 spray, 2 spray, Each Nare, Daily, Collins, Emma M, PA-C, 2 spray at 01/28/20 1043 .  guaiFENesin (MUCINEX) 12 hr tablet 600 mg, 600 mg, Oral, BID, Albertine Grates, MD, 600 mg at 02/04/20 2042 .  HYDROcodone-acetaminophen (NORCO/VICODIN) 5-325 MG per tablet 1 tablet, 1 tablet, Oral, Q4H PRN, Setzer, Lynnell Jude, PA-C .  levalbuterol (XOPENEX) nebulizer solution 1.25 mg, 1.25 mg, Nebulization, Q6H PRN, Albertine Grates, MD, 1.25 mg at 02/04/20 1851 .  levothyroxine (SYNTHROID) tablet 50 mcg, 50 mcg, Oral, Daily, Clinton Gallant M, PA-C, 50 mcg at 02/05/20 0544 .  metoprolol succinate (TOPROL-XL) 24 hr tablet 25 mg, 25 mg, Oral, Daily, Croitoru, Mihai, MD, 25 mg at 02/05/20 1026 .  metoprolol tartrate (LOPRESSOR) injection 2.5 mg, 2.5 mg, Intravenous, Q4H PRN, Clinton Gallant M, PA-C, 2.5 mg at 01/25/20 1917 .  mirabegron ER (MYRBETRIQ) tablet 25 mg, 25 mg, Oral, Daily, Clinton Gallant M, PA-C, 25 mg at 02/05/20 1026 .  multivitamin with minerals tablet 1 tablet, 1 tablet, Oral, Daily, Clinton Gallant M, PA-C, 1 tablet at 01/31/20 1021 .  nitroGLYCERIN (NITROSTAT) SL tablet 0.4 mg, 0.4 mg, Sublingual, Q5 min PRN, Clinton Gallant M, PA-C, 0.4 mg at 01/26/20 1358 .  pantoprazole (PROTONIX) EC tablet 40 mg, 40 mg, Oral, Daily, Clinton Gallant M, PA-C, 40 mg at 02/05/20 1018 .  polyethylene glycol (MIRALAX / GLYCOLAX) packet 17 g, 17 g, Oral, BID, Albertine Grates, MD, 17 g at 02/02/20 0925 .  polyvinyl alcohol (LIQUIFILM TEARS) 1.4 % ophthalmic solution 1 drop, 1 drop, Both Eyes, PRN, Collins, Emma M, PA-C .  predniSONE (DELTASONE) tablet 10 mg, 10 mg, Oral, Daily, Albertine Grates, MD, 10 mg at 02/05/20 1018 .  saccharomyces boulardii (FLORASTOR) capsule 250 mg, 250 mg, Oral, BID, Albertine Grates, MD, 250 mg at 02/03/20 2053 .  senna-docusate (Senokot-S) tablet 1 tablet, 1 tablet, Oral, BID, Albertine Grates, MD, 1 tablet at 02/03/20 0827 .  Tiotropium Bromide Monohydrate AERS 2 puff, 2 puff, Inhalation, Daily, Ilda Basset, [place], 2 puff at 02/05/20 1029 .  vitamin B-12 (CYANOCOBALAMIN) tablet 1,000 mcg, 1,000 mcg, Oral, Daily, Clinton Gallant M, PA-C, 1,000 mcg at 02/05/20 1018  Patients Current Diet:  Diet Order            Diet - low sodium heart healthy           Diet Heart Room service appropriate? Yes; Fluid consistency: Thin; Fluid restriction: 1500 mL Fluid  Diet effective now                 Precautions / Restrictions Precautions Precautions: Fall,Other (comment) Precaution Comments: H/o R AKA (s/p revision 1/18) Other Brace: had post  op shoe prior to R AKA revision Restrictions Weight Bearing Restrictions: Yes RLE Weight Bearing: Non weight bearing   Has the patient had 2 or more falls or  a fall with injury in the past year? Yes  Prior Activity Level Household: per wife, was mod I for mobility at w/c level after last rehab admission, not going out (virtual doctor's visits, etc), and needed some assist for LB ADLs. Limited Community (1-2x/wk): only going out for appointments, using wc for primary mobility, able to transfer without assistance (mod I per wife)  Prior Functional Level Self Care: Did the patient need help bathing, dressing, using the toilet or eating? Needed some help  Indoor Mobility: Did the patient need assistance with walking from room to room (with or without device)? Mod I w/c level mobility.   Stairs: Did the patient need assistance with internal or external stairs (with or without device)? Dependent  Functional Cognition: Did the patient need help planning regular tasks such as shopping or remembering to take medications? Needed some help  Home Assistive Devices / Equipment Home Assistive Devices/Equipment: Wheelchair (triloghy) Home Equipment: Walker - 2 wheels,Cane - single point,Shower seat,Transport chair,Tub bench  Prior Device Use: Indicate devices/aids used by the patient prior to current illness, exacerbation or injury? Manual wheelchair, Walker and stair lift, tub bench, BSC  Current Functional Level Cognition  Overall Cognitive Status: Impaired/Different from baseline Current Attention Level: Selective Orientation Level: Oriented X4 Following Commands: Follows one step commands consistently,Follows one step commands with increased time General Comments: WFL for simple tasks. Apparent slowed processing at times, potentially exacerbated by fatigue and HOH. Pt stating "I can't" before trying to attempt mobility    Extremity Assessment (includes Sensation/Coordination)  Upper  Extremity Assessment: Generalized weakness  Lower Extremity Assessment: Defer to PT evaluation RLE Deficits / Details: Bandage intact on right residual limb LLE Deficits / Details: Redness present distal LLE and into foot; small sore on heel LLE Sensation: decreased light touch    ADLs  Overall ADL's : Needs assistance/impaired Eating/Feeding: Set up,Independent,Sitting Grooming: Wash/dry hands,Wash/dry face,Brushing hair,Min guard,Sitting Upper Body Bathing: Min guard,Sitting Lower Body Bathing: Maximal assistance,Sitting/lateral leans Upper Body Dressing : Min guard,Sitting Lower Body Dressing: Maximal assistance,Sitting/lateral leans Toilet Transfer: Moderate assistance,RW,Stand-pivot,Cueing for safety Toilet Transfer Details (indicate cue type and reason): simulated Toileting- Clothing Manipulation and Hygiene: Total assistance Functional mobility during ADLs: Moderate assistance,Cueing for safety,Rolling walker    Mobility  Overal bed mobility: Needs Assistance Bed Mobility: Sit to Supine Rolling: Mod assist Supine to sit: Min guard,HOB elevated Sit to supine: Min guard,HOB elevated General bed mobility comments: received sitting EOB finishing dinner.    Transfers  Overall transfer level: Needs assistance Equipment used: 1 person hand held assist,Rolling walker (2 wheeled) Transfers: Lateral/Scoot Transfers Sit to Stand: Mod assist Stand pivot transfers: Min guard Squat pivot transfers: Mod assist  Lateral/Scoot Transfers: Min assist General transfer comment: along EOB toward HOB to simulate slide board transfer. pt able to lift hips with BUE support to scoot hips fairly well, minA at times for transfer pad assist; pt deferring to stand due to heartburn/just ate and mild nausea    Ambulation / Gait / Stairs / Wheelchair Mobility  Ambulation/Gait General Gait Details: deferred due to lethargy and need for assist for squat pivot    Posture / Balance Dynamic Sitting  Balance Sitting balance - Comments: seated EOB ~10 mins with no LOB, occasional cues for upright posture needed Balance Overall balance assessment: History of Falls,Needs assistance Sitting-balance support: No upper extremity supported Sitting balance-Leahy Scale: Fair Sitting balance - Comments: seated EOB ~10 mins with no LOB, occasional cues for upright posture needed Standing balance-Leahy Scale: Poor Standing  balance comment: Reliant on UE support and external assist    Special needs/care consideration CPAP, Oxygen 2L, Skin R AKA revision, chronic wounds to L foot, Diabetic management yes and Behavioral consideration dementia   Previous Home Environment (from acute therapy documentation) Living Arrangements: Spouse/significant other Available Help at Discharge: Family,Available 24 hours/day Type of Home: House Home Layout: Multi-level,Bed/bath upstairs Alternate Level Stairs-Rails: Right Alternate Level Stairs-Number of Steps: Enters from carport with level entry. 9 to kitchen/dining room/living room. 6 additional steps to bedroom/bathroom with 3 bedrooms and 2 bathrooms with 1 rail with each set of stairs. Home Access: Level entry Bathroom Shower/Tub: Walk-in shower,Tub/shower unit Bathroom Toilet: Standard Bathroom Accessibility: Yes Home Care Services: Yes Type of Home Care Services: Home PT,Home [place] (if known): [facility] Additional Comments: O2 2L, stair lift  Discharge Living Setting Plans for Discharge Living Setting: Patient's home Type of Home at Discharge: House Discharge Home Layout: Multi-level,Bed/bath upstairs Alternate Level Stairs-Rails:  (stair lift) Discharge Home Access: Level entry (from car port) Discharge Bathroom Shower/Tub: Tub/shower unit,Walk-in shower Discharge Bathroom Toilet: Standard Discharge Bathroom Accessibility: Yes How Accessible: Accessible via wheelchair Does the patient have any problems obtaining your  medications?: No  Social/Family/Support Systems Patient Roles: Spouse Anticipated Caregiver: Pedro Whiters (spouse) Anticipated Caregiver's Contact Information: [phone] Ability/Limitations of Caregiver: min guard for mobility, mod for ADLs Caregiver Availability: 24/7 Discharge Plan Discussed with Primary Caregiver: Yes Is Caregiver In Agreement with Plan?: Yes Does Caregiver/Family have Issues with Lodging/Transportation while Pt is in Rehab?: No  Goals Patient/Family Goal for Rehab: PT supervision w/c level, OT supervision to min assist for ADLs Expected length of stay: 9-12 days Additional Information: Pt with admission to CIR in 08/2019 with d/c to SNF Pt/Family Agrees to Admission and willing to participate: Yes Program Orientation Provided & Reviewed with Pt/Caregiver Including Roles  & Responsibilities: Yes  Decrease burden of Care through IP rehab admission: n/a  Possible need for SNF placement upon discharge: Potentially.  Pt with supervision mobility goals, however did d/c to SNF last admission due to need for fluctuating levels of assist for mobility  Patient Condition: I have reviewed medical records from [facility], spoken with CM, and spouse. I discussed via phone for inpatient rehabilitation assessment.  Patient will benefit from ongoing PT and OT, can actively participate in 3 hours of therapy a day 5 days of the week, and can make measurable gains during the admission.  Patient will also benefit from the coordinated team approach during an Inpatient Acute Rehabilitation admission.  The patient will receive intensive therapy as well as Rehabilitation physician, nursing, social worker, and care management interventions.  Due to safety, skin/wound care, disease management, medication administration, pain management and patient education the patient requires 24 hour a day rehabilitation nursing.  The patient is currently min to mod assist with mobility and basic  ADLs.  Discharge setting and therapy post discharge at home with home health is anticipated.  Patient has agreed to participate in the Acute Inpatient Rehabilitation Program and will admit today.  Preadmission Screen Completed By:  Michel Santee, PT, DPT 02/05/2020 10:42 AM ______________________________________________________________________   Discussed status with Dr. Dagoberto Ligas on 02/05/20  at 10:59 AM  and received approval for admission today.  Admission Coordinator:  Michel Santee, PT, DPT time 10:59 AM Sudie Grumbling 02/05/20    Assessment/Plan: Diagnosis: 1. Does the need for close, 24 hr/day Medical supervision in concert with the patient's rehab needs make it unreasonable for this patient  to be served in a less intensive setting? Yes 2. Co-Morbidities requiring supervision/potential complications: R AKA revision, LLE cellutlitis- Afib with RVR, ABLA, chronic foley 3. Due to bladder management, bowel management, safety, skin/wound care, disease management, medication administration, pain management and patient education, does the patient require 24 hr/day rehab nursing? Yes 4. Does the patient require coordinated care of a physician, rehab nurse, PT, OT, and SLP to address physical and functional deficits in the context of the above medical diagnosis(es)? Yes Addressing deficits in the following areas: balance, endurance, locomotion, strength, transferring, bathing, dressing, feeding, grooming and toileting 5. Can the patient actively participate in an intensive therapy program of at least 3 hrs of therapy 5 days a week? Yes 6. The potential for patient to make measurable gains while on inpatient rehab is good 7. Anticipated functional outcomes upon discharge from inpatient rehab: supervision and min assist PT, supervision and min assist OT, n/a SLP 8. Estimated rehab length of stay to reach the above functional goals is: 9-12 days 9. Anticipated discharge destination: Home 10. Overall  Rehab/Functional Prognosis: good   MD Signature:

## 2020-02-05 NOTE — Plan of Care (Signed)
  Problem: Health Behavior/Discharge Planning: Goal: Ability to manage health-related needs will improve Outcome: Progressing   Problem: Clinical Measurements: Goal: Cardiovascular complication will be avoided Outcome: Progressing   Problem: Activity: Goal: Risk for activity intolerance will decrease Outcome: Progressing   

## 2020-02-05 NOTE — Progress Notes (Signed)
Nutrition Follow-up  DOCUMENTATION CODES:   Underweight,Non-severe (moderate) malnutrition in context of chronic illness  INTERVENTION:   -D/c Boost Breeze po TID, each supplement provides 250 kcal and 9 grams of protein -Magic cup TID with meals, each supplement provides 290 kcal and 9 grams of protein -30 ml Prosource Plus BID, each supplement provides 100 kcals and 15 grams protein -Continue MVI with minerals daily  NUTRITION DIAGNOSIS:   Moderate Malnutrition related to chronic illness (COPD) as evidenced by percent weight loss,mild fat depletion,moderate fat depletion.  Ongoing  GOAL:   Patient will meet greater than or equal to 90% of their needs  Progressing   MONITOR:   PO intake,Supplement acceptance,Labs,Weight trends,Skin,I & O's  REASON FOR ASSESSMENT:   Malnutrition Screening Tool    ASSESSMENT:   [name] is a 87 y.o. male with medical history significant for hypertension, hyperlipidemia, COPD, 2 L/min as well as trilogy, diastolic heart failure, status post right above-knee amputation, sacral decubitus ulcer, who comes in because of ongoing problems with his cellulitis of the left leg he has been seeing his primary care provider who has been managing him with outpatient antibiotics but it got worse and was sent to the emergency department 5 days ago for possible admission for cellulitis he was evaluated and found to have cellulitis but did not require inpatient care and was sent home on oral doxycycline.  He was then seen at the wound clinic left foot has continued to worsen despite antibiotics.  He was seen again  by his primary care provider yesterday who directed him to return to the emergency room for admission  1/18- s/p Revision of right above knee amputation  Reviewed I/O's: -2 L x 24 hours and -13.1 L since admission  UOP: 2.7 L x 24 hours  Pt unavailable at time of attempted contact.   Pt remains with good appetite. Noted meal completions  75-100%. Pt is refusing Boost Breeze supplements. Per prior visit, pt also does not like Ensure Enlive.   Per chart review, pt is hopeful for CIR admission.   Medications reviewed and include vitamin D3, miralax, deltasone, senokot, and vitamin B-12.   Labs reviewed: Na: 127.    Diet Order:   Diet Order            Diet Heart Room service appropriate? Yes; Fluid consistency: Thin; Fluid restriction: 1500 mL Fluid  Diet effective now                 EDUCATION NEEDS:   Education needs have been addressed  Skin:  Skin Assessment: Skin Integrity Issues: Skin Integrity Issues:: Other (Comment) Stage II: coccyx Other: rt stump wound (rt AKA)  Last BM:  02/04/20  Height:   Ht Readings from Last 1 Encounters:  01/28/20 5\' 8"  (1.727 m)    Weight:   Wt Readings from Last 1 Encounters:  02/05/20 52.7 kg    Ideal Body Weight:  64.4 kg (adjusted for rt AKA)  BMI:  Body mass index is 17.67 kg/m.  Estimated Nutritional Needs:   Kcal:  1800-2000  Protein:  100-115 grams  Fluid:  > 1.8 L    Loistine Chance, RD, LDN, [place] Registered Dietitian II Certified Diabetes Care and Education Specialist Please refer to [facility] for RD and/or RD on-call/weekend/after hours pager

## 2020-02-05 NOTE — Progress Notes (Signed)
Pt arrived to unit, pt on 2L O2 via n/c with wife at bedside. Pt and wife oriented to rehab

## 2020-02-05 NOTE — Progress Notes (Signed)
Pt has home BIPAP machine at bedside, and needs no assistance. Pt also has home inhaler at bedside, and administers himself.

## 2020-02-05 NOTE — Progress Notes (Signed)
Pt refused CHG bath due to irritation of skin but did agree to foley care.

## 2020-02-05 NOTE — Consult Note (Signed)
   [facility] CM Inpatient Consult   02/05/2020  [name] 18-Feb-1929 [id number]  Follow up: Active with [facility] RNCM  Patient is being admitted to inpatient rehab.  Plan: Will alert the [place] Coordinator of this this transition of care.  Please contact, for questions,  Natividad Brood, RN BSN [facility] Liaison  [phone] business mobile phone Toll free office [phone]  Fax number: [phone] [email or web address]

## 2020-02-05 NOTE — Discharge Summary (Signed)
Physician Discharge Summary  [name] MBW:[id number] DOB: [birth year] DOA: 01/23/2020  PCP: [name], MD  Admit date: 01/23/2020 Discharge date: 02/05/2020  Admitted From: home Disposition:  CIR  Recommendations for Outpatient Follow-up:  1. Follow up with PCP in 1-2 weeks 2. Please obtain BMP/CBC in 4 DAYS 3. Please follow up on the following pending results:  Home Health:NO  Equipment/Devices: NONE  Discharge Condition: Stable Code Status:   Code Status: Full Code Diet recommendation:  Diet Order            Diet - low sodium heart healthy           Diet Heart Room service appropriate? Yes; Fluid consistency: Thin; Fluid restriction: 1500 mL Fluid  Diet effective now                  Brief/Interim Summary: 87 year old male with complex comorbidities with hypertension, hyperlipidemia, COPD/chronic hypoxic respiratory failure on 2 L nasal cannula at the lowest trilogy at home, chronic diastolic CHF, status post right AKA presented with left foot pain and redness that did not improve on doxycycline.  He was found to have left foot redness going towards the ankle and will start on IV antibiotics.  Seen by vascular surgery revision surgery right AKA stump on 1/18, per vascular no further need of antibiotic on the right leg. Patient also treated for left lower cellulitis work up w/ MRSA screen negative blood culture no growth complete 7 days of antibiotic but left foot looked worse ESR/CRP slightly elevated, placed on Augmentin 1/20, and left foot appears better WBC normalized on 1/22.  As per vascular patient has declined amputation of the left foot.  Patient had episode of chest pain with mildly positive troponin cardiology was consulted. Seen by PT OT CIR recommended 1/124: Patient had episode of A. fib with RVR cardiology back on board, converted back to normal sinus rhythm.  Patient is mobilizing more seen by PT OT CIR has been recommended and is being discharged to CIR  today discussed with cardiology and okay to discharge.  Holding Lasix and Allegra for 1 more days, he will be followed by cardiology in the rehab to resume this medication. I would recommend to check BMP daily for the next couple more days.  Discharge Diagnoses:  Cellulitis of left foot:MRSA screen negative blood culture no growth completed 7 days of antibiotic but left foot looked worse ESR/CRP slightly elevated, so was placed on Augmentin 1/20, and now left foot still it is improved WBC count normalized CRP normal.  Complete 7 days course of Augmentin, advised taking 1 more day, will benefit with compression stocking on the left foot while he is sitting up.  He will need follow-up with cardiology to resume diuretics while in rehab.   Right BKA stump wound/PVD: recent right AKA, seen by vascular surgery revision surgery right AKA stump on 1/18, per vascular no further need of antibiotic on the right leg.  Small area of mild drainage.  Healing well and vascular surgery planning for  follow-up in 2 to 3 weeks for staple removal.  Doing well.  A Flutter/A FIB w/ RVR, new onset-Short episode, seen by cardiology back In NSR-Continue Toprol 25 mg-increased from his home 12.5 mg. Not a candidate for long-term DOAC. 30 days event monitor being planned after discharge.  Chest pain with mildly positive troponin/history of CAD status post CABG, cards following.  Suspecting tachycardia related angina.  No acute changes in EKG continue plan as per  cardiology.  Chronic diastolic CHF: Euvolemic, monitor volume status closely.  Holding Lasix and Aldactone at least for 1 more day, to be followed by cardiology in the inpatient rehab.    Chronic respiratory failure with hypoxia/hypercapnia/COPD/obstructive sleep apnea on Triology: Normally on 2l [place] at home.  Continue respiratory support bronchodilators, home steroid prednisone 10 mg andTriology, PT OT eval as tolerated.  AKI-resolved. Creat stable Recent  Labs  Lab 02/01/20 0238 02/02/20 0511 02/03/20 0339 02/04/20 0129 02/05/20 0054  BUN _0 CREATININE 0.86 0.90 0.96 1.02 0.93   Hyponatremia: Sodium: 128-130.  Holding Lasix, status post short course of salt tablets while here in acute care.  Follow-up BMP tomorrow his Lasix and Aldactone will be seen by cardiology in follow-up in the inpatient rehab.Wife endorses patient had similar episodes of hyponatremia when he is sick.  Recent Labs  Lab 02/01/20 0238 02/02/20 0511 02/03/20 0339 02/04/20 0129 02/05/20 0054  NA 129* 128* 125* 126* 127*   NSVT: On beta-blocker.  Hyperlipidemia: Continue statin  Normocytic anemia patient with acute drop in hemoglobin as low as 6.6 g 01/30/20- Hemoglobin around 8.8-10 gm from 2021, suspect acute blood loss anemia from  #2/surgery?? Along with anemia of chronic disease -s/p 1 unit PRBC transfusion 1/22.  Hemoglobin holding stable at 8.5 g monitor HB intermittently at the rehab Recent Labs  Lab 02/01/20 0238 02/02/20 0511 02/03/20 0339 02/04/20 0129 02/05/20 0054  HGB 8.8* 9.3* 8.4* 8.5* 8.8*  HCT 24.6* 28.1* 25.0* 25.3* 25.9*   Constipation: Continue MiraLAX, Senokot and as needed suppository.  Chronic indwelling Foley catheter last changed 1/7 followed by [name] from urology.  Continue his home regimen.  Patient complains of some bladder irritation and some leakage around the Foley.  Foley is inflated well per nursing.  If continues to issue we can discuss with his urologist.  GOC: Full code, palliative care has been consulted. Does not appear bright.  He will benefit with ongoing discussion with the palliative care.  Pressure Ulcer:  Pressure Injury 01/24/20 Coccyx Mid;Lower Stage 2 -  Partial thickness loss of dermis presenting as a shallow open injury with a red, pink wound bed without slough. (Active)  01/24/20 1445  Location: Coccyx  Location Orientation: Mid;Lower  Staging: Stage 2 -  Partial thickness loss  of dermis presenting as a shallow open injury with a red, pink wound bed without slough.  Wound Description (Comments):   Present on Admission: Yes    Consults:  cardiology  Subjective: Alert awake oriented wife at the bedside.  On 2 L nasal cannula, some leg swelling on the left leg but wife states whenever he is hanging his legs he gets dependent edema.  Discharge Exam: Vitals:   02/05/20 0420 02/05/20 0841  BP: (!) 103/54 (!) 104/59  Pulse: 74 75  Resp: 18 14  Temp: (!) 97.5 F (36.4 C) 98.2 F (36.8 C)  SpO2: 98% 99%   General: Pt is alert, awake, not in acute distress Cardiovascular: RRR, S1/S2 +, no rubs, no gallops Respiratory: CTA bilaterally, no wheezing, no rhonchi Abdominal: Soft, NT, ND, bowel sounds + Extremities: no edema, no cyanosis, rt amputation site with stable intact staple, swelling on the left foot  Discharge Instructions  Discharge Instructions    Diet - low sodium heart healthy   Complete by: As directed    Discharge instructions   Complete by: As directed    Check BMP daily for 3 more days   Discharge wound  care:   Complete by: As directed    Wound care  Daily      Comments: Wound care to right stump full thickness wound:  Cleanse with NS, pat dry. Cover with xeroform gauze Kellie Simmering # [phone]). Top with dry dressing and secure with paper tape.   Wound care  Daily      Comments: Wound care to Stage 2 partial thickness skin loss at coccyx: Cleanse with soap and water, rinse and pat dry. Apply collagenase in a thin layer.  Top with saline moistened gauze (opened), then cover with dry gauze. Top with silicone foam for sacrum with orientation of "tip" toward head and away from rectum.  Turn and reposition patient from side to side and minimize time in the supine position   Increase activity slowly   Complete by: As directed      Allergies as of 02/05/2020      Reactions   Cefdinir Diarrhea, Other (See Comments)   Severe Diarrhea   Nitrofurantoin  Swelling, Other (See Comments)   Hand Swelling   Sulfa Antibiotics Anaphylaxis, Swelling   Sulfonamide Derivatives Swelling, Other (See Comments)   Facial/tongue swelling   Tape Other (See Comments)   SKIN IS VERY THIN AND TEARS EASILY!!!!! Please do NOT use "plastic" tape- USE PAPER!!   Ciprofloxacin Itching, Rash, Other (See Comments)   Red itchy hands   Nitrofurantoin Swelling, Other (See Comments)   Swollen hands   Amoxicillin Er Other (See Comments)   Frequest urination. Pt reports "not really" allergic 01/24/20   Doxycycline Other (See Comments)   "Felt terrible" Pt wife reports "not really" allergic 01/24/20   Levaquin [levofloxacin] Itching, Rash   Sertraline Anxiety, Other (See Comments)   Makes the patient jittery      Medication List    STOP taking these medications   cephALEXin 500 MG capsule Commonly known as: Keflex   doxycycline 100 MG capsule Commonly known as: VIBRAMYCIN   furosemide 20 MG tablet Commonly known as: LASIX   potassium chloride 20 MEQ/15ML (10%) Soln   spironolactone 25 MG tablet Commonly known as: ALDACTONE     TAKE these medications   acetaminophen 500 MG tablet Commonly known as: TYLENOL Take 500 mg by mouth every 6 (six) hours as needed.   ALPRAZolam 0.5 MG tablet Commonly known as: XANAX Take 1 tablet (0.5 mg total) by mouth 3 (three) times daily. What changed:   when to take this  reasons to take this   amoxicillin-clavulanate 875-125 MG tablet Commonly known as: AUGMENTIN Take 1 tablet by mouth every 12 (twelve) hours for 1 day.   aspirin 81 MG tablet Take 81 mg by mouth daily.   atorvastatin 20 MG tablet Commonly known as: LIPITOR TAKE 1 TABLET BY MOUTH EVERY DAY What changed: when to take this   calcium carbonate 500 MG chewable tablet Commonly known as: TUMS - dosed in mg elemental calcium Chew 1 tablet (200 mg of elemental calcium total) by mouth 2 (two) times daily as needed for indigestion or heartburn.    famotidine 20 MG tablet Commonly known as: PEPCID Take 20 mg by mouth at bedtime as needed.   finasteride 5 MG tablet Commonly known as: PROSCAR Take 5 mg by mouth daily with lunch.   fluticasone 50 MCG/ACT nasal spray Commonly known as: FLONASE Place 2 sprays into both nostrils daily.   guaiFENesin 600 MG 12 hr tablet Commonly known as: MUCINEX Take 1 tablet (600 mg total) by mouth 2 (two) times daily.  levalbuterol 0.63 MG/3ML nebulizer solution Commonly known as: XOPENEX Take 3 mLs (0.63 mg total) by nebulization every 4 (four) hours as needed for wheezing or shortness of breath.   levalbuterol 45 MCG/ACT inhaler Commonly known as: Xopenex HFA Inhale 2 puffs into the lungs every 4 (four) hours as needed for wheezing or shortness of breath.   levothyroxine 50 MCG tablet Commonly known as: SYNTHROID Take 50 mcg by mouth daily.   metoprolol succinate 25 MG 24 hr tablet Commonly known as: TOPROL-XL Take 1 tablet (25 mg total) by mouth daily. What changed: how much to take   Myrbetriq 25 MG Tb24 tablet Generic drug: mirabegron ER Take 1 tablet (25 mg total) by mouth daily.   Nitrostat 0.4 MG SL tablet Generic drug: nitroGLYCERIN Place 0.4 mg under the tongue every 5 (five) minutes as needed for chest pain.   OXYGEN Inhale 2 L/min into the lungs continuous.   pantoprazole 40 MG tablet Commonly known as: PROTONIX TAKE 1 TABLET(40 MG) BY MOUTH DAILY 30 TO 60 MINUTES BEFORE FIRST MEAL OF THE DAY What changed: See the new instructions.   polyethylene glycol 17 g packet Commonly known as: MIRALAX / GLYCOLAX Take 17 g by mouth daily.   predniSONE 10 MG tablet Commonly known as: DELTASONE FOR FLARE OF COUGH/WHEEZING MAY INCREASE TO 2 DAILY BETTER, THAN 1 TABLET BY MOUTH EVERY DAY What changed:   how much to take  how to take this  when to take this  additional instructions   PreserVision AREDS 2 Caps Take 1 capsule by mouth daily.   saccharomyces  boulardii 250 MG capsule Commonly known as: FLORASTOR Take 1 capsule (250 mg total) by mouth 2 (two) times daily for 7 days.   senna-docusate 8.6-50 MG tablet Commonly known as: Senokot-S Take 1 tablet by mouth 2 (two) times daily.   sodium chloride 5 % ophthalmic solution Commonly known as: MURO 128 Place 1 drop into both eyes 4 (four) times daily.   Spiriva Respimat 2.5 MCG/ACT Aers Generic drug: Tiotropium Bromide Monohydrate INHALE 2 PUFFS BY MOUTH EVERY DAY What changed:   how much to take  how to take this  when to take this  additional instructions   Symbicort 160-4.5 MCG/ACT inhaler Generic drug: budesonide-formoterol Inhale 2 puffs into the lungs 2 (two) times daily.   Systane 0.4-0.3 % Soln Generic drug: Polyethyl Glycol-Propyl Glycol Apply 1 drop to eye 2 (two) times daily as needed (eye irritation).   vitamin B-12 1000 MCG tablet Commonly known as: CYANOCOBALAMIN Take 1,000 mcg by mouth daily.   Vitamin D3 50 MCG (2000 UT) Tabs Take 2,000 Units by mouth daily with lunch.   Zinc Oxide 12.8 % ointment Commonly known as: TRIPLE PASTE Apply topically as needed for irritation. To gluteal cleft/buttocks            Discharge Care Instructions  (From admission, onward)         Start     Ordered   02/05/20 0000  Discharge wound care:       Comments: Wound care  Daily      Comments: Wound care to right stump full thickness wound:  Cleanse with NS, pat dry. Cover with xeroform gauze Kellie Simmering # [phone]). Top with dry dressing and secure with paper tape.   Wound care  Daily      Comments: Wound care to Stage 2 partial thickness skin loss at coccyx: Cleanse with soap and water, rinse and pat dry. Apply collagenase in a thin  layer.  Top with saline moistened gauze (opened), then cover with dry gauze. Top with silicone foam for sacrum with orientation of "tip" toward head and away from rectum.  Turn and reposition patient from side to side and minimize time in the  supine position   02/05/20 1013          Follow-up Information    Waynetta Sandy, MD In 4 weeks.   Specialties: Vascular Surgery, Cardiology Why: Office will call you to arrange your appt (sent) Contact information: [address] [place] [address] [phone]              Allergies  Allergen Reactions  . Cefdinir Diarrhea and Other (See Comments)    Severe Diarrhea  . Nitrofurantoin Swelling and Other (See Comments)    Hand Swelling  . Sulfa Antibiotics Anaphylaxis and Swelling  . Sulfonamide Derivatives Swelling and Other (See Comments)    Facial/tongue swelling  . Tape Other (See Comments)    SKIN IS VERY THIN AND TEARS EASILY!!!!! Please do NOT use "plastic" tape- USE PAPER!!  . Ciprofloxacin Itching, Rash and Other (See Comments)    Red itchy hands  . Nitrofurantoin Swelling and Other (See Comments)    Swollen hands  . Amoxicillin Er Other (See Comments)    Frequest urination. Pt reports "not really" allergic 01/24/20  . Doxycycline Other (See Comments)    "Felt terrible" Pt wife reports "not really" allergic 01/24/20  . Levaquin [Levofloxacin] Itching and Rash  . Sertraline Anxiety and Other (See Comments)    Makes the patient jittery    The results of significant diagnostics from this hospitalization (including imaging, microbiology, ancillary and laboratory) are listed below for reference.    Microbiology: Recent Results (from the past 240 hour(s))  Surgical PCR screen     Status: None   Collection Time: 01/27/20  6:03 AM   Specimen: Nasal Mucosa; Nasal Swab  Result Value Ref Range Status   MRSA, PCR NEGATIVE NEGATIVE Final   Staphylococcus aureus NEGATIVE NEGATIVE Final    Comment: (NOTE) The Xpert SA Assay (FDA approved for NASAL specimens in patients 33 years of age and older), is one component of a comprehensive surveillance program. It is not intended to diagnose infection nor to guide or monitor treatment. Performed at [facility], [place] [address], [place], [place] [address]   Expectorated sputum assessment w rflx to resp cult     Status: None   Collection Time: 01/30/20  4:13 PM   Specimen: Expectorated Sputum  Result Value Ref Range Status   Specimen Description EXPECTORATED SPUTUM  Final   Special Requests NONE  Final   Sputum evaluation   Final    Sputum specimen not acceptable for testing.  Please recollect.   Gram Stain Report Called to,Read Back By and Verified With: RN Dion Saucier [phone] MLM Performed at [facility], [place] [address], [place], [place] [address]    Report Status 01/31/2020 FINAL  Final  SARS CORONAVIRUS 2 (TAT 6-24 HRS) Nasopharyngeal Nasopharyngeal Swab     Status: None   Collection Time: 02/04/20 11:32 AM   Specimen: Nasopharyngeal Swab  Result Value Ref Range Status   SARS Coronavirus 2 NEGATIVE NEGATIVE Final    Comment: (NOTE) SARS-CoV-2 target nucleic acids are NOT DETECTED.  The SARS-CoV-2 RNA is generally detectable in upper and lower respiratory specimens during the acute phase of infection. Negative results do not preclude SARS-CoV-2 infection, do not rule out co-infections with  other pathogens, and should not be used as the sole basis for treatment or other patient management decisions. Negative results must be combined with clinical observations, patient history, and epidemiological information. The expected result is Negative.  Fact Sheet for Patients: SugarRoll.be  Fact Sheet for Healthcare Providers: https://www.woods-mathews.com/  This test is not yet approved or cleared by the [place] FDA and  has been authorized for detection and/or diagnosis of SARS-CoV-2 by FDA under an Emergency Use Authorization (EUA). This EUA will remain  in effect (meaning this test can be used) for the duration of the COVID-19 declaration under Se ction 564(b)(1) of the Act, 21 U.S.C. section 360bbb-3(b)(1), unless the  authorization is terminated or revoked sooner.  Performed at [facility], [place] [address] [place], [place] [address]     Procedures/Studies: DG Knee 2 Views Right  Result Date: 01/23/2020 CLINICAL DATA:  Prior right above the knee amputation. EXAM: RIGHT KNEE - 1-2 VIEW COMPARISON:  None. FINDINGS: A right-sided above the knee amputation is seen without evidence of acute fracture. Marked severity vascular calcification is noted. IMPRESSION: Right-sided above the knee amputation without evidence of acute osseous abnormality. Electronically Signed   By: Virgina Norfolk M.D.   On: 01/23/2020 20:20   MR FEMUR RIGHT W WO CONTRAST  Result Date: 01/24/2020 CLINICAL DATA:  Left lower extremity wounds EXAM: MRI OF THE RIGHT FEMUR WITHOUT AND WITH CONTRAST TECHNIQUE: Multiplanar, multisequence MR imaging of the right was performed both before and after administration of intravenous contrast. CONTRAST:  24m GADAVIST GADOBUTROL 1 MMOL/ML IV SOLN COMPARISON:  None. FINDINGS: Bones/Joint/Cartilage The patient is status post above the knee amputation. At the amputation site no definite areas of abnormal marrow signal or cortical destruction. No large hip joint effusions are seen. Ligaments Suboptimally visualized Muscles and Tendons There is increased feathery signal seen within the tensor fascia lata and the vastus lateralis muscle belly. The tendons appear to be intact. Soft tissues Mild subcutaneous edema seen along the lateral aspect of the hip. There is also mild edema seen along the amputation site. No loculated fluid collection is seen. For IMPRESSION: Mild soft tissue edema seen at the amputation site without evidence of soft tissue abscess or osteomyelitis. Electronically Signed   By: BPrudencio PairM.D.   On: 01/24/2020 19:34   MR FOOT LEFT W WO CONTRAST  Result Date: 01/24/2020 CLINICAL DATA:  Question of osteomyelitis, left lower extremity wounds EXAM: MRI OF THE LEFT FOREFOOT WITHOUT AND WITH  CONTRAST TECHNIQUE: Multiplanar, multisequence MR imaging of the left was performed both before and after administration of intravenous contrast. CONTRAST:  664mGADAVIST GADOBUTROL 1 MMOL/ML IV SOLN COMPARISON:  None. FINDINGS: Bones/Joint/Cartilage Mildly increased T2 hyperintense signal seen at the distal tuft of the first digit. No C8-T1 hypointensity or areas of abnormal enhancement are noted. No areas of cortical destruction or periosteal reaction. Mild first MTP joint osteoarthritis is seen. No large joint effusions are noted. Ligaments The Lisfranc and collateral ligaments are intact. Muscles and Tendons Mildly increased T2 hyperintense signal seen within the muscles surrounding the forefoot appear no areas of focal atrophy or tear. The flexor and extensor tendons are intact. Soft tissues Dorsal subcutaneous edema and mild skin thickening are noted a. This diffusely surround the midfoot and hindfoot. No loculated fluid collections are noted. IMPRESSION: Diffuse subcutaneous edema surrounding the midfoot and hindfoot which could be due to cellulitis. No definite evidence of osteomyelitis. Mildly reactive marrow within the distal tuft of the first digit.  Electronically Signed   By: Prudencio Pair M.D.   On: 01/24/2020 19:28   DG CHEST PORT 1 VIEW  Result Date: 01/30/2020 CLINICAL DATA:  Cough EXAM: PORTABLE CHEST 1 VIEW COMPARISON:  August 18, 2019 FINDINGS: Lungs are hyperexpanded. There is scarring in the left base. There is no edema or airspace opacity. The heart size and pulmonary vascularity are within normal limits. Patient is status post coronary artery bypass grafting. There is aortic atherosclerosis. No adenopathy. Bones are osteoporotic. IMPRESSION: Lungs hyperexpanded. Mild scarring left base. No edema or airspace opacity. Heart size normal. Status post coronary artery bypass grafting. Aortic Atherosclerosis (ICD10-I70.0). Electronically Signed   By: Lowella Grip III M.D.   On: 01/30/2020 13:43    DG Foot Complete Left  Result Date: 01/23/2020 CLINICAL DATA:  Left foot swelling. EXAM: LEFT FOOT - COMPLETE 3+ VIEW COMPARISON:  None. FINDINGS: There is no evidence of fracture or dislocation. There is no evidence of arthropathy or other focal bone abnormality. There is marked severity diffuse soft tissue swelling, most prominent along the dorsal aspect of the left foot. Mild vascular calcification is seen. IMPRESSION: Marked severity diffuse soft tissue swelling, without an acute osseous abnormality. Electronically Signed   By: Virgina Norfolk M.D.   On: 01/23/2020 20:21   ECHOCARDIOGRAM COMPLETE  Result Date: 01/28/2020    ECHOCARDIOGRAM REPORT   Patient Name:   [name] [facility] Date of Exam: 01/28/2020 Medical Rec #:  [id number]    Height:       68.0 in Accession #:    [id number]   Weight:       115.3 lb Date of Birth:  09/08/1929    BSA:          1.617 m Patient Age:    19 years     BP:           110/75 mmHg Patient Gender: M            HR:           108 bpm. Exam Location:  Inpatient Procedure: 2D Echo, Cardiac Doppler, Color Doppler and Intracardiac            Opacification Agent Indications:    R07.9* Chest pain, unspecified  History:        Patient has prior history of Echocardiogram examinations, most                 recent 03/09/2017. CHF, CAD, COPD and Carotid Disease,                 Signs/Symptoms:Syncope; Risk Factors:Hypertension and                 Dyslipidemia. Cancer. Elevated Troponin.  Sonographer:    Jonelle Sidle Dance Referring Phys: [place]  1. Left ventricular ejection fraction, by estimation, is 60 to 65%. The left ventricle has normal function. The left ventricle has no regional wall motion abnormalities. There is mild concentric left ventricular hypertrophy. Left ventricular diastolic parameters are indeterminate.  2. Right ventricular systolic function is normal. The right ventricular size is normal.  3. The mitral valve is normal in structure. No evidence of  mitral valve regurgitation. No evidence of mitral stenosis.  4. The aortic valve is tricuspid. There is mild calcification of the aortic valve. There is mild thickening of the aortic valve. Aortic valve regurgitation is not visualized. Mild aortic valve sclerosis is present, with no evidence of aortic valve stenosis.  5. The inferior  vena cava is normal in size with greater than 50% respiratory variability, suggesting right atrial pressure of 3 mmHg. FINDINGS  Left Ventricle: Left ventricular ejection fraction, by estimation, is 60 to 65%. The left ventricle has normal function. The left ventricle has no regional wall motion abnormalities. Definity contrast agent was given IV to delineate the left ventricular  endocardial borders. The left ventricular internal cavity size was normal in size. There is mild concentric left ventricular hypertrophy. Left ventricular diastolic parameters are indeterminate. Right Ventricle: The right ventricular size is normal. No increase in right ventricular wall thickness. Right ventricular systolic function is normal. Left Atrium: Left atrial size was normal in size. Right Atrium: Right atrial size was normal in size. Pericardium: There is no evidence of pericardial effusion. Mitral Valve: The mitral valve is normal in structure. No evidence of mitral valve regurgitation. No evidence of mitral valve stenosis. Tricuspid Valve: The tricuspid valve is normal in structure. Tricuspid valve regurgitation is not demonstrated. No evidence of tricuspid stenosis. Aortic Valve: The aortic valve is tricuspid. There is mild calcification of the aortic valve. There is mild thickening of the aortic valve. Aortic valve regurgitation is not visualized. Mild aortic valve sclerosis is present, with no evidence of aortic valve stenosis. Pulmonic Valve: The pulmonic valve was normal in structure. Pulmonic valve regurgitation is not visualized. No evidence of pulmonic stenosis. Aorta: The aortic root is  normal in size and structure. Venous: The inferior vena cava is normal in size with greater than 50% respiratory variability, suggesting right atrial pressure of 3 mmHg. IAS/Shunts: No atrial level shunt detected by color flow Doppler.  LEFT VENTRICLE PLAX 2D LVIDd:         4.10 cm LVIDs:         3.10 cm LV PW:         1.10 cm LV IVS:        1.10 cm LVOT diam:     1.90 cm LV SV:         41 LV SV Index:   25 LVOT Area:     2.84 cm  IVC IVC diam: 1.80 cm LEFT ATRIUM           Index LA diam:      2.70 cm 1.67 cm/m LA Vol (A4C): 8.7 ml  5.40 ml/m  AORTIC VALVE LVOT Vmax:   73.30 cm/s LVOT Vmean:  46.900 cm/s LVOT VTI:    0.143 m  AORTA Ao Root diam: 3.50 cm Ao Asc diam:  3.20 cm MITRAL VALVE MV Area (PHT): 3.03 cm     SHUNTS MV Decel Time: 250 msec     Systemic VTI:  0.14 m MV E velocity: 104.00 cm/s  Systemic Diam: 1.90 cm MV A velocity: 78.20 cm/s MV E/A ratio:  1.33 Skeet Latch MD Electronically signed by Skeet Latch MD Signature Date/Time: 01/28/2020/12:18:33 PM    Final     Labs: BNP (last 3 results) No results for input(s): BNP in the last 8760 hours. Basic Metabolic Panel: Recent Labs  Lab 01/30/20 0223 01/31/20 0225 02/01/20 0238 02/02/20 0511 02/03/20 0339 02/04/20 0129 02/05/20 0054  NA 130* 126* 129* 128* 125* 126* 127*  K 4.1 4.3 4.6 4.2 4.3 4.1 4.4  CL 94* 91* 93* 91* 88* 88* 90*  CO2 _0 GLUCOSE 111* 136* 117* 93 95 85 99  BUN _1 CREATININE 1.07 0.86 0.86 0.90 0.96 1.02 0.93  CALCIUM 8.7* 8.6* 9.1 9.2 8.7* 8.7* 8.6*  MG 1.8 2.0  --   --   --   --   --    Liver Function Tests: Recent Labs  Lab 01/30/20 0223  AST 24  ALT 15  ALKPHOS 44  BILITOT 0.7  PROT 4.6*  ALBUMIN 2.9*   No results for input(s): LIPASE, AMYLASE in the last 168 hours. No results for input(s): AMMONIA in the last 168 hours. CBC: Recent Labs  Lab 01/30/20 0223 01/31/20 0225 02/01/20 0238 02/02/20 0511 02/03/20 0339 02/04/20 0129 02/05/20 0054   WBC 10.6* 6.0 7.8 7.8 7.2 7.7 7.7  NEUTROABS 9.2* 5.6  --   --   --   --   --   HGB 7.0* 6.6* 8.8* 9.3* 8.4* 8.5* 8.8*  HCT 20.1* 19.1* 24.6* 28.1* 25.0* 25.3* 25.9*  MCV 95.3 93.6 91.1 94.0 94.0 94.1 93.8  PLT 158 180 196 215 213 222 246   Cardiac Enzymes: No results for input(s): CKTOTAL, CKMB, CKMBINDEX, TROPONINI in the last 168 hours. BNP: Invalid input(s): POCBNP CBG: No results for input(s): GLUCAP in the last 168 hours. D-Dimer No results for input(s): DDIMER in the last 72 hours. Hgb A1c No results for input(s): HGBA1C in the last 72 hours. Lipid Profile No results for input(s): CHOL, HDL, LDLCALC, TRIG, CHOLHDL, LDLDIRECT in the last 72 hours. Thyroid function studies No results for input(s): TSH, T4TOTAL, T3FREE, THYROIDAB in the last 72 hours.  Invalid input(s): FREET3 Anemia work up No results for input(s): VITAMINB12, FOLATE, FERRITIN, TIBC, IRON, RETICCTPCT in the last 72 hours. Urinalysis    Component Value Date/Time   COLORURINE YELLOW 01/20/2020 0125   APPEARANCEUR HAZY (A) 01/20/2020 0125   LABSPEC 1.010 01/20/2020 0125   PHURINE 6.0 01/20/2020 0125   GLUCOSEU NEGATIVE 01/20/2020 0125   HGBUR NEGATIVE 01/20/2020 0125   BILIRUBINUR NEGATIVE 01/20/2020 0125   KETONESUR NEGATIVE 01/20/2020 0125   PROTEINUR NEGATIVE 01/20/2020 0125   NITRITE POSITIVE (A) 01/20/2020 0125   LEUKOCYTESUR LARGE (A) 01/20/2020 0125   Sepsis Labs Invalid input(s): PROCALCITONIN,  WBC,  LACTICIDVEN Microbiology Recent Results (from the past 240 hour(s))  Surgical PCR screen     Status: None   Collection Time: 01/27/20  6:03 AM   Specimen: Nasal Mucosa; Nasal Swab  Result Value Ref Range Status   MRSA, PCR NEGATIVE NEGATIVE Final   Staphylococcus aureus NEGATIVE NEGATIVE Final    Comment: (NOTE) The Xpert SA Assay (FDA approved for NASAL specimens in patients 64 years of age and older), is one component of a comprehensive surveillance program. It is not intended to  diagnose infection nor to guide or monitor treatment. Performed at [facility], [place] [address], [place], [place] [address]   Expectorated sputum assessment w rflx to resp cult     Status: None   Collection Time: 01/30/20  4:13 PM   Specimen: Expectorated Sputum  Result Value Ref Range Status   Specimen Description EXPECTORATED SPUTUM  Final   Special Requests NONE  Final   Sputum evaluation   Final    Sputum specimen not acceptable for testing.  Please recollect.   Gram Stain Report Called to,Read Back By and Verified With: RN Dion Saucier [phone] MLM Performed at [facility], [place] [address], [place], [place] [address]    Report Status 01/31/2020 FINAL  Final  SARS CORONAVIRUS 2 (TAT 6-24 HRS) Nasopharyngeal Nasopharyngeal Swab     Status: None   Collection Time: 02/04/20  11:32 AM   Specimen: Nasopharyngeal Swab  Result Value Ref Range Status   SARS Coronavirus 2 NEGATIVE NEGATIVE Final    Comment: (NOTE) SARS-CoV-2 target nucleic acids are NOT DETECTED.  The SARS-CoV-2 RNA is generally detectable in upper and lower respiratory specimens during the acute phase of infection. Negative results do not preclude SARS-CoV-2 infection, do not rule out co-infections with other pathogens, and should not be used as the sole basis for treatment or other patient management decisions. Negative results must be combined with clinical observations, patient history, and epidemiological information. The expected result is Negative.  Fact Sheet for Patients: SugarRoll.be  Fact Sheet for Healthcare Providers: https://www.woods-mathews.com/  This test is not yet approved or cleared by the [place] FDA and  has been authorized for detection and/or diagnosis of SARS-CoV-2 by FDA under an Emergency Use Authorization (EUA). This EUA will remain  in effect (meaning this test can be used) for the duration of the COVID-19 declaration under Se  ction 564(b)(1) of the Act, 21 U.S.C. section 360bbb-3(b)(1), unless the authorization is terminated or revoked sooner.  Performed at [facility], [place] [address], [place], [place] [address]      Time coordinating discharge: 35 minutes  SIGNED: Antonieta Pert, MD  [facility] 02/05/2020, 10:18 AM  If 7PM-7AM, please contact night-coverage [email or web address]

## 2020-02-06 DIAGNOSIS — I5032 Chronic diastolic (congestive) heart failure: Secondary | ICD-10-CM

## 2020-02-06 DIAGNOSIS — Z89611 Acquired absence of right leg above knee: Secondary | ICD-10-CM | POA: Diagnosis not present

## 2020-02-06 DIAGNOSIS — D649 Anemia, unspecified: Secondary | ICD-10-CM | POA: Diagnosis not present

## 2020-02-06 DIAGNOSIS — I25118 Atherosclerotic heart disease of native coronary artery with other forms of angina pectoris: Secondary | ICD-10-CM

## 2020-02-06 DIAGNOSIS — E039 Hypothyroidism, unspecified: Secondary | ICD-10-CM | POA: Diagnosis not present

## 2020-02-06 DIAGNOSIS — E8809 Other disorders of plasma-protein metabolism, not elsewhere classified: Secondary | ICD-10-CM | POA: Diagnosis not present

## 2020-02-06 DIAGNOSIS — Z9981 Dependence on supplemental oxygen: Secondary | ICD-10-CM

## 2020-02-06 DIAGNOSIS — I480 Paroxysmal atrial fibrillation: Secondary | ICD-10-CM

## 2020-02-06 DIAGNOSIS — E46 Unspecified protein-calorie malnutrition: Secondary | ICD-10-CM

## 2020-02-06 DIAGNOSIS — E871 Hypo-osmolality and hyponatremia: Secondary | ICD-10-CM

## 2020-02-06 DIAGNOSIS — G8918 Other acute postprocedural pain: Secondary | ICD-10-CM

## 2020-02-06 DIAGNOSIS — F329 Major depressive disorder, single episode, unspecified: Secondary | ICD-10-CM

## 2020-02-06 LAB — CBC WITH DIFFERENTIAL/PLATELET
Abs Immature Granulocytes: 0.11 10*3/uL — ABNORMAL HIGH (ref 0.00–0.07)
Basophils Absolute: 0 10*3/uL (ref 0.0–0.1)
Basophils Relative: 0 %
Eosinophils Absolute: 0.1 10*3/uL (ref 0.0–0.5)
Eosinophils Relative: 1 %
HCT: 28 % — ABNORMAL LOW (ref 39.0–52.0)
Hemoglobin: 9.5 g/dL — ABNORMAL LOW (ref 13.0–17.0)
Immature Granulocytes: 1 %
Lymphocytes Relative: 7 %
Lymphs Abs: 0.6 10*3/uL — ABNORMAL LOW (ref 0.7–4.0)
MCH: 32.8 pg (ref 26.0–34.0)
MCHC: 33.9 g/dL (ref 30.0–36.0)
MCV: 96.6 fL (ref 80.0–100.0)
Monocytes Absolute: 0.5 10*3/uL (ref 0.1–1.0)
Monocytes Relative: 7 %
Neutro Abs: 6.4 10*3/uL (ref 1.7–7.7)
Neutrophils Relative %: 84 %
Platelets: 292 10*3/uL (ref 150–400)
RBC: 2.9 MIL/uL — ABNORMAL LOW (ref 4.22–5.81)
RDW: 13.6 % (ref 11.5–15.5)
WBC: 7.7 10*3/uL (ref 4.0–10.5)
nRBC: 0 % (ref 0.0–0.2)

## 2020-02-06 LAB — COMPREHENSIVE METABOLIC PANEL
ALT: 28 U/L (ref 0–44)
AST: 28 U/L (ref 15–41)
Albumin: 3.1 g/dL — ABNORMAL LOW (ref 3.5–5.0)
Alkaline Phosphatase: 55 U/L (ref 38–126)
Anion gap: 9 (ref 5–15)
BUN: 16 mg/dL (ref 8–23)
CO2: 30 mmol/L (ref 22–32)
Calcium: 9.2 mg/dL (ref 8.9–10.3)
Chloride: 91 mmol/L — ABNORMAL LOW (ref 98–111)
Creatinine, Ser: 0.94 mg/dL (ref 0.61–1.24)
GFR, Estimated: >60 mL/min (ref >60)
Glucose, Bld: 120 mg/dL — ABNORMAL HIGH (ref 70–99)
Potassium: 4.2 mmol/L (ref 3.5–5.1)
Sodium: 130 mmol/L — ABNORMAL LOW (ref 135–145)
Total Bilirubin: 1.1 mg/dL (ref 0.3–1.2)
Total Protein: 5.2 g/dL — ABNORMAL LOW (ref 6.5–8.1)

## 2020-02-06 MED ORDER — SPIRONOLACTONE 12.5 MG HALF TABLET
12.5000 mg | ORAL_TABLET | Freq: Every day | ORAL | Status: DC
Start: 2020-02-06 — End: 2020-02-06

## 2020-02-06 MED ORDER — FLUOXETINE HCL 10 MG PO CAPS
10.0000 mg | ORAL_CAPSULE | Freq: Every day | ORAL | Status: DC
  Administered 2020-02-11 – 2020-02-13 (×3): 10 mg via ORAL
  Filled 2020-02-06 (×11): qty 1

## 2020-02-06 MED ORDER — SPIRONOLACTONE 12.5 MG HALF TABLET
12.5000 mg | ORAL_TABLET | Freq: Every day | ORAL | Status: DC
  Administered 2020-02-07 – 2020-02-12 (×6): 12.5 mg via ORAL
  Filled 2020-02-06 (×7): qty 1

## 2020-02-06 MED ORDER — CHLORHEXIDINE GLUCONATE CLOTH 2 % EX PADS
6.0000 | MEDICATED_PAD | Freq: Every day | CUTANEOUS | Status: DC
  Administered 2020-02-06 – 2020-02-07 (×2): 6 via TOPICAL

## 2020-02-06 MED ORDER — FUROSEMIDE 20 MG PO TABS
20.0000 mg | ORAL_TABLET | Freq: Every day | ORAL | Status: DC
  Administered 2020-02-07 – 2020-02-10 (×4): 20 mg via ORAL
  Filled 2020-02-06 (×4): qty 1

## 2020-02-06 MED ORDER — FUROSEMIDE 20 MG PO TABS
20.0000 mg | ORAL_TABLET | Freq: Every day | ORAL | Status: DC
Start: 2020-02-06 — End: 2020-02-06

## 2020-02-06 NOTE — Evaluation (Signed)
Occupational Therapy Assessment and Plan  Patient Details  Name: [name] MRN: [id number] Date of Birth: 06-20-1929  OT Diagnosis: acute pain and muscle weakness (generalized) Rehab Potential: Rehab Potential (ACUTE ONLY): Good ELOS: 7-10 days   Today's Date: 02/06/2020 OT Individual Time: 9169-4503 OT Individual Time Calculation (min): 69 min     Hospital Problem: Principal Problem:   Right above-knee amputee [facility])   Past Medical History:  Past Medical History:  Diagnosis Date  . Anxiety   . Arthritis    "generalized" (04/04/2017)  . Asthma   . Basal cell carcinoma    "left ear"  . BPH (benign prostatic hyperplasia)    severe; s/p multiple biopsies  . CAD (coronary artery disease)   . Carotid artery occlusion   . Chronic respiratory failure ([place])   . Chronic rhinitis   . COPD (chronic obstructive pulmonary disease) (HCC)    2L [place] O2  . Diastolic heart failure ([place]) 2019  . Dilation of biliary tract   . Elevated troponin 03/09/2017  . Gallstones   . GERD (gastroesophageal reflux disease)   . History of blood transfusion    "w/his CABG" (04/04/2017)  . History of kidney stones   . Hyperlipidemia   . Hypertension   . On home oxygen therapy    "~ 24/7" (04/04/2017)  . Peripheral vascular disease ([place])   . Pneumonia 2019  . Squamous cell carcinoma of skin 04/23/2013   in situ-Right flank (txpbx)  . Squamous cell carcinoma of skin 01/23/2017   [place]/ in siut- Left ear rim (CX3FU)  . Syncope and collapse   . Ureteral tumor 08/2015   had endoscopic procedure for evaluation, unable to reach for biopsy  . Urinary catheter (Foley) change required    Past Surgical History:  Past Surgical History:  Procedure Laterality Date  . ABDOMINAL AORTOGRAM W/LOWER EXTREMITY Bilateral 06/30/2019   Procedure: ABDOMINAL AORTOGRAM W/LOWER EXTREMITY;  Surgeon: Waynetta Sandy, MD;  Location: [place] [facility];  Service: Cardiovascular;  Laterality: Bilateral;  . AMPUTATION  Right 08/11/2019   Procedure: RIGHT ABOVE KNEE AMPUTATION;  Surgeon: Angelia Mould, MD;  Location: [place] [facility];  Service: Vascular;  Laterality: Right;  . AMPUTATION Right 01/27/2020   Procedure: RIGHT AMPUTATION ABOVE KNEE REVISION;  Surgeon: Waynetta Sandy, MD;  Location: [place];  Service: Vascular;  Laterality: Right;  . BASAL CELL CARCINOMA EXCISION Left    ear  . CARDIAC CATHETERIZATION     "prior to bypass"  . CATARACT EXTRACTION W/ INTRAOCULAR LENS  IMPLANT, BILATERAL Bilateral   . CORONARY ARTERY BYPASS GRAFT  10/2001   LIMA to LAD, SVG to OM1-2, SVG to RCA and PDA  . LITHOTRIPSY    . PROSTATE BIOPSY  Oct. 2014  . TONSILLECTOMY      Assessment & Plan Clinical Impression: Patient is a 87 y.o. right-handed male with history significant for chronic urinary retention with chronic indwelling Foley catheter tube followed by [name] of urology services last changed 01/16/2020, hypertension, hyperlipidemia, CAD with CABG 2003 followed by cardiology services Dr. Daneen Schick, COPD 2 L oxygen, peripheral vascular disease, diastolic congestive heart failure, sacral decubitus ulcer, right AKA 08/11/2019 as well as history of chronic wounds to lower extremities receiving inpatient rehab services 08/21/2019 to 09/01/2019 and was discharged to skilled nursing facility.  Presented 01/24/2020 with left lower extremity cellulitis he had been seeing his primary care provider maintained on antibiotics.  He was sent to the emergency department for possible admission for cellulitis  and initially sent home on doxycycline.  He was later seen at the wound clinic for his left foot continued to worsen despite antibiotics.  Vascular surgery follow-up Dr. Donzetta Matters and on examination was noted to have open wound of the right AKA stump with exposed bone.  X-rays obtained showing some demineralization but no osteomyelitis.  MRI was ordered to evaluate osteomyelitis showing no areas of abnormal marrow signal or cortical  destruction.  Patient did undergo revision of right AKA 01/27/2020 per Dr. Donzetta Matters.  Work-up of left lower extremity cellulitis MRSA screen negative blood cultures no growth he did complete 7 days antibiotics but left foot looked worse ESR/CRP slightly elevated placed on Augmentin 01/29/2020 to be completed 02/05/2020 with improvement in latest WBC 7.7..  Patient had declined amputation of left foot.  Hospital course patient has had some hyponatremia 130-126.  Cardiology services consulted during hospital stay for episodic chest pain/atrial flutter with RVR of new onset.  Placed on Toprol not a candidate for long-term DOAC.  30-day event monitor planned at discharge.  Currently holding Lasix and Aldactone due to hyponatremia and resume as needed.  Palliative care was consulted to establish goals of care.  Patient with decubitus ulcer buttocks Santyl ointment twice daily with wound care nurse follow-up.  Random Covid testing 02/04/2020 negative.  Therapy evaluations completed due to patient decrease in functional ability was admitted for a comprehensive rehab program.  Patient transferred to CIR on 02/05/2020 .    Patient currently requires min-max with basic self-care skills secondary to muscle weakness, decreased cardiorespiratoy endurance and decreased oxygen support and decreased standing balance, decreased postural control and decreased balance strategies.  Prior to hospitalization, patient could complete ADLs with supervision overall, Min Assist for LB bathing and dressing.  Patient will benefit from skilled intervention to decrease level of assist with basic self-care skills prior to discharge home with care partner.  Anticipate patient will require 24 hour supervision and minimal physical assistance and follow up home health.  OT - End of Session Activity Tolerance: Tolerates 30+ min activity with multiple rests Endurance Deficit: Yes Endurance Deficit Description: requires frequent rest breaks OT  Assessment Rehab Potential (ACUTE ONLY): Good OT Barriers to Discharge: Wound Care OT Patient demonstrates impairments in the following area(s): Balance;Endurance;Motor;Pain;Safety OT Basic ADL's Functional Problem(s): Grooming;Bathing;Dressing;Toileting OT Transfers Functional Problem(s): Toilet OT Additional Impairment(s): None OT Plan OT Intensity: Minimum of 1-2 x/day, 45 to 90 minutes OT Frequency: 5 out of 7 days OT Treatment/Interventions: Balance/vestibular training;Discharge planning;Disease Lawyer;Functional mobility training;Pain management;Patient/family education;Psychosocial support;Self Care/advanced ADL retraining;Skin care/wound managment;Therapeutic Activities;Therapeutic Exercise;UE/LE Strength taining/ROM;Wheelchair propulsion/positioning OT Basic Self-Care Anticipated Outcome(s): Supervision - Min A OT Toileting Anticipated Outcome(s): Min A OT Bathroom Transfers Anticipated Outcome(s): Min A OT Recommendation Patient destination: Home Follow Up Recommendations: Home health OT;24 hour supervision/assistance Equipment Recommended: None recommended by OT (has all needed equipment)   OT Evaluation Precautions/Restrictions  Precautions Precautions: Fall Restrictions Weight Bearing Restrictions: Yes RLE Weight Bearing: Non weight bearing General   Vital Signs Oxygen Therapy SpO2: 98 % O2 Device: Nasal Cannula O2 Flow Rate (L/min): 2 L/min FiO2 (%): 28 % Pain Pain Assessment Pain Scale: Faces Faces Pain Scale: Hurts a little bit Pain Type: Surgical pain;Phantom pain Pain Location: Leg Pain Orientation: Right Pain Descriptors / Indicators: Aching;Discomfort Home Living/Prior Functioning Home Living Family/patient expects to be discharged to:: Private residence Living Arrangements: Spouse/significant other Available Help at Discharge: Family,Available 24 hours/day Type of Home: House Home Access: Level  entry Home Layout: Multi-level,Bed/bath upstairs  Alternate Level Stairs-Number of Steps: Enters from carport with level entry. 9 to kitchen/dining room/living room. 6 additional steps to bedroom/bathroom with 3 bedrooms and 2 bathrooms with 1 rail with each set of stairs.  Has stair lift on each set of stairs. Alternate Level Stairs-Rails: Right Bathroom Shower/Tub: Walk-in shower,Tub/shower Personal assistant: Standard Bathroom Accessibility: Yes Additional Comments: O2 2L, tub bench, drop arm BSC, stair lift  Lives With: Spouse IADL History Homemaking Responsibilities: No Prior Function Level of Independence: Requires assistive device for independence,Needs assistance with ADLs Bath: Minimal Dressing: Minimal  Able to Take Stairs?:  (stair lift) Driving: No Vision Baseline Vision/History: Wears glasses Wears Glasses: At all times Patient Visual Report: No change from baseline Vision Assessment?: No apparent visual deficits Perception  Perception: Within Functional Limits Praxis Praxis: Intact Cognition Overall Cognitive Status: Impaired/Different from baseline (difficult to tell due to [facility] and requiring increased time) Arousal/Alertness: Awake/alert Orientation Level: Person Year: 2022 Month: January Day of Week: Incorrect ("I couldn't tell you") Memory: Appears intact Immediate Memory Recall: Sock;Blue;Bed Memory Recall Sock: Without Cue Memory Recall Blue: Without Cue Memory Recall Bed: Without Cue Sensation Sensation Light Touch: Appears Intact Proprioception: Impaired by gross assessment Coordination Gross Motor Movements are Fluid and Coordinated: No Fine Motor Movements are Fluid and Coordinated: Not tested Coordination and Movement Description: grossly uncoordinated due to R AKA, decreased balance/postural control, generalized weakness, and fatigue Motor  Motor Motor: Abnormal postural alignment and control Motor - Skilled Clinical Observations: grossly  uncoordinated due to R AKA, decreased balance/postural control, generalized weakness, and fatigue  Trunk/Postural Assessment  Cervical Assessment Cervical Assessment: Exceptions to [name] (forward head) Thoracic Assessment Thoracic Assessment: Exceptions to [facility] (rounded shoulders) Lumbar Assessment Lumbar Assessment: Exceptions to [facility] (posterior pelvic tilt) Postural Control Postural Control: Deficits on evaluation  Balance Balance Balance Assessed: Yes Static Sitting Balance Static Sitting - Balance Support: Feet supported;Bilateral upper extremity supported Static Sitting - Level of Assistance: 5: Stand by assistance (supervision) Dynamic Sitting Balance Dynamic Sitting - Balance Support: Feet supported;Bilateral upper extremity supported (supervision) Static Standing Balance Static Standing - Balance Support: Bilateral upper extremity supported (parallel bars) Static Standing - Level of Assistance: 5: Stand by assistance (CGA) Dynamic Standing Balance Dynamic Standing - Balance Support: Bilateral upper extremity supported (parallel bars) Dynamic Standing - Level of Assistance: 4: Min assist Extremity/Trunk Assessment RUE Assessment RUE Assessment: Within Functional Limits General Strength Comments: grossly 4-5/5 LUE Assessment LUE Assessment: Within Functional Limits General Strength Comments: grossly 4-5/5  Care Tool Care Tool Self Care Eating   Eating Assist Level: Set up assist    Oral Care         Bathing   Body parts bathed by patient: Right arm;Left arm;Chest;Abdomen;Front perineal area;Left upper leg;Face Body parts bathed by helper: Buttocks;Left lower leg Body parts n/a: Right upper leg;Right lower leg Assist Level: Moderate Assistance - Patient 50 - 74%    Upper Body Dressing(including orthotics)   What is the patient wearing?: Pull over shirt   Assist Level: Minimal Assistance - Patient > 75%    Lower Body Dressing (excluding footwear)   What is the  patient wearing?: Underwear/pull up;Pants Assist for lower body dressing: Maximal Assistance - Patient 25 - 49%    Putting on/Taking off footwear   What is the patient wearing?: Non-skid slipper socks Assist for footwear: Total Assistance - Patient < 25%       Care Tool Toileting Toileting activity         Care Tool Bed Mobility  Roll left and right activity   Roll left and right assist level: Contact Guard/Touching assist    Sit to lying activity        Lying to sitting edge of bed activity   Lying to sitting edge of bed assist level: Contact Guard/Touching assist     Care Tool Transfers Sit to stand transfer Sit to stand activity did not occur: Safety/medical concerns      Chair/bed transfer Chair/bed transfer activity did not occur: Producer, television/film/video transfer activity did not occur: Refused       Care Tool Cognition Expression of Ideas and Wants Expression of Ideas and Wants: Without difficulty (complex and basic) - expresses complex messages without difficulty and with speech that is clear and easy to understand   Understanding Verbal and Non-Verbal Content Understanding Verbal and Non-Verbal Content: Usually understands - understands most conversations, but misses some part/intent of message. Requires cues at times to understand   Memory/Recall Ability *first 3 days only Memory/Recall Ability *first 3 days only: Current season;Staff names and faces;That he or she is in a hospital/hospital unit    Refer to Care Plan for [place] 1 OT Short Term Goal 1 (Week 1): Pt will complete toilet transfers with mod assist OT Short Term Goal 2 (Week 1): Pt will complete LB dressing mod assist at sit > stand level OT Short Term Goal 3 (Week 1): Pt will complete toileting tasks with mod assist  Recommendations for other services: None    Skilled Therapeutic Intervention OT eval completed with discussion of rehab process, OT  purpose, POC, ELOS, and goals.  ADL assessment completed with focus on self-care retraining and participation in self-care tasks.  Pt received upright in bed finishing breakfast. Pt reports feeling "alright, but I'm not well."  Pt requested to sit to EOB to take inhalers.  Pt completed bed mobility for elevated HOB with CGA.  Pt required increased time to take his inhalers per his typical routine.  Pt then agreeable to engaging in bathing and dressing from EOB.  Pt able to complete UB bathing and dressing with setup of items.  Pt with increased difficulty and frustration with LB dressing due to catheter bag as pt typically uses leg bag at home.  Encouraged pt to ask MD about use of leg bag during the day.  Pt refused any attempts at standing this session due to fatigue.  Pt required assistance of therapist to thread RLE due to fatigue and frustration about catheter bag.  Pt returned to supine and required assistance to pull pants over hips with rolling both directions.  [name] arrived towards end of session and able to provide additional information regarding pt PLOF.   ADL ADL Eating: Set up Where Assessed-Eating: Edge of bed Upper Body Bathing: Supervision/safety Where Assessed-Upper Body Bathing: Edge of bed Lower Body Bathing: Moderate assistance Where Assessed-Lower Body Bathing: Edge of bed;Bed level Upper Body Dressing: Minimal assistance Where Assessed-Upper Body Dressing: Sitting at sink Lower Body Dressing: Maximal assistance Where Assessed-Lower Body Dressing: Bed level Toileting: Dependent (foley catheter) Mobility  Bed Mobility Bed Mobility: Rolling Right;Right Sidelying to Sit Rolling Right: Contact Guard/Touching assist Right Sidelying to Sit: Contact Guard/Touching assist Transfers Sit to Stand: Minimal Assistance - Patient > 75% Stand to Sit: Contact Guard/Touching assist    Discharge Criteria: Patient will be discharged from OT if patient refuses treatment 3 consecutive  times without medical reason,  if treatment goals not met, if there is a change in medical status, if patient makes no progress towards goals or if patient is discharged from hospital.  The above assessment, treatment plan, treatment alternatives and goals were discussed and mutually agreed upon: by patient and by family  Ellwood Dense [facility] 02/06/2020, 9:29 AM

## 2020-02-06 NOTE — Progress Notes (Addendum)
[facility] PHYSICAL MEDICINE & REHABILITATION PROGRESS NOTE  Subjective/Complaints: Patient seen sitting up in bed this morning.  He states he slept well overnight.  Wife at bedside.  He states he is ready to begin therapies, but is unsure if he will be able to.  Wife with questions regarding electrolytes and medications.  Patient requests antidepressant.  ROS: + Baseline shortness of breath.  Denies CP, N/V/D  Objective: Vital Signs: Blood pressure 115/70, pulse 79, temperature (!) 97 F (36.1 C), temperature source Oral, resp. rate 18, weight 53.9 kg, SpO2 98 %. No results found. Recent Labs    02/05/20 0054 02/06/20 0805  WBC 7.7 7.7  HGB 8.8* 9.5*  HCT 25.9* 28.0*  PLT 246 292   Recent Labs    02/05/20 0054 02/06/20 0805  NA 127* 130*  K 4.4 4.2  CL 90* 91*  CO2 27 30  GLUCOSE 99 120*  BUN 15 16  CREATININE 0.93 0.94  CALCIUM 8.6* 9.2    Intake/Output Summary (Last 24 hours) at 02/06/2020 1105 Last data filed at 02/06/2020 0700 Gross per 24 hour  Intake 560 ml  Output 1200 ml  Net -640 ml     Pressure Injury 06/27/19 Coccyx Posterior;Mid Stage 1 -  Intact skin with non-blanchable redness of a localized area usually over a bony prominence. (Active)  06/27/19 2300  Location: Coccyx  Location Orientation: Posterior;Mid  Staging: Stage 1 -  Intact skin with non-blanchable redness of a localized area usually over a bony prominence.  Wound Description (Comments):   Present on Admission: Yes     Pressure Injury 08/20/19 Coccyx Mid Stage 2 -  Partial thickness loss of dermis presenting as a shallow open injury with a red, pink wound bed without slough. (Active)  08/20/19 2200  Location: Coccyx  Location Orientation: Mid  Staging: Stage 2 -  Partial thickness loss of dermis presenting as a shallow open injury with a red, pink wound bed without slough.  Wound Description (Comments):   Present on Admission: Yes     Pressure Injury 08/27/19 Buttocks Medial (Active)   08/27/19 1030  Location: Buttocks  Location Orientation: Medial  Staging:   Wound Description (Comments):   Present on Admission:      Pressure Injury 01/24/20 Coccyx Mid;Lower Stage 2 -  Partial thickness loss of dermis presenting as a shallow open injury with a red, pink wound bed without slough. (Active)  01/24/20 1445  Location: Coccyx  Location Orientation: Mid;Lower  Staging: Stage 2 -  Partial thickness loss of dermis presenting as a shallow open injury with a red, pink wound bed without slough.  Wound Description (Comments):   Present on Admission: Yes    Physical Exam: BP 115/70 (BP Location: Left Arm)   Pulse 79   Temp (!) 97 F (36.1 C) (Oral)   Resp 18   Wt 53.9 kg   SpO2 98%   BMI 18.07 kg/m  Constitutional: No distress . Vital signs reviewed. HENT: Normocephalic.  Atraumatic. Eyes: EOMI. No discharge. Cardiovascular: No JVD.  RRR. Respiratory: Normal effort.  No stridor.  Bilateral clear to auscultation.  + [place]. GI: Non-distended.  BS +. GU: + Foley. Skin: Warm and dry.   Right AKA with erythema along staple lines Sacral ulcer not examined today. Psych: Normal mood.  Normal behavior. Musc:  Right AKA with edema and tenderness Left lower extremity edema Neuro: Alert Very HOH Motor: Bilateral upper extremities: 5/5 proximal distal Right lower extremity: Hip flexion 3+/5 (pain inhibition) Left  lower extremity: Hip flexion, knee extension, ankle dorsiflexion 4 -/5  Assessment/Plan: 1. Functional deficits which require 3+ hours per day of interdisciplinary therapy in a comprehensive inpatient rehab setting.  Physiatrist is providing close team supervision and 24 hour management of active medical problems listed below.  Physiatrist and rehab team continue to assess barriers to discharge/monitor patient progress toward functional and medical goals   Care Tool:  Bathing    Body parts bathed by patient: Right arm,Left arm,Chest,Abdomen,Front perineal  area,Left upper leg,Face   Body parts bathed by helper: Buttocks,Left lower leg Body parts n/a: Right upper leg,Right lower leg   Bathing assist Assist Level: Moderate Assistance - Patient 50 - 74%     Upper Body Dressing/Undressing Upper body dressing   What is the patient wearing?: Pull over shirt    Upper body assist Assist Level: Minimal Assistance - Patient > 75%    Lower Body Dressing/Undressing Lower body dressing      What is the patient wearing?: Underwear/pull up,Pants     Lower body assist Assist for lower body dressing: Maximal Assistance - Patient 25 - 49%     Toileting Toileting    Toileting assist       Transfers Chair/bed transfer  Transfers assist  Chair/bed transfer activity did not occur: Refused        Locomotion Ambulation   Ambulation assist              Walk 10 feet activity   Assist           Walk 50 feet activity   Assist           Walk 150 feet activity   Assist           Walk 10 feet on uneven surface  activity   Assist           Wheelchair     Assist               Wheelchair 50 feet with 2 turns activity    Assist            Wheelchair 150 feet activity     Assist           Medical Problem List and Plan: 1.  Decreased functional mobility secondary to cellulitis left foot as well as right AKA revision 01/27/2020  Begin CIR evaluations  2.  Antithrombotics: -DVT/anticoagulation:Lovenox               -antiplatelet therapy: Aspirin 81 mg daily 3. Pain Management: Tylenol prn per wife   Monitor with increased exertion. 4. Mood: Provide emotional support  Fluoxetine started on 1/28 for depression             -antipsychotic agents: N/A 5. Neuropsych: This patient is?  Fully capable of making decisions on his own behalf. 6. Skin/Wound decubitus to coccyx and R AKA.  Skin care as directed.  Routine skin checks- dressing to be changed BID and prn  Monitor  wounds 7. Fluids/Electrolytes/Nutrition: Routine in and outs. 8.  ID/cellulitis left foot.  MRSA screen negative.  Blood cultures no growth to date.  Currently completing course of Augmentin 02/05/2020 9.  Atrial flutter/A. fib with RVR.  Toprol 25 mg daily.  Not a candidate for long-term DOAC.  30-day event monitor as outpatient.  Follow-up cardiology services  Monitor with increased activity 10.  Hyponatremia.  Lasix and Aldactone currently on hold- per Cards for today with plans to resume tomorrow.  Sodium 130 on 1/28, labs ordered for tomorrow  Restart sodium chloride tabs as necessary 11.  Chronic diastolic congestive heart failure.    Lasix and Aldactone currently on hold, plan to restart tomorrow  Followed by cardiology services  Monitor for any signs of fluid overload Filed Weights   02/06/20 0526 02/06/20 0902  Weight: 52.7 kg 53.9 kg   12.  Chronic respiratory failure with hypoxia/COPD.  Chronic oxygen therapy 2 L.  Continue chronic prednisone. 13.  Chronic urinary retention.  Chronic Foley tube followed by urology service [name].  Continue Myrbetriq as well as Proscar.  Change Foley catheter to routinely- last changed 01/16/2020- . 14.  GERD.  Protonix 15.  Chronic sacral decubitus.  Santyl ointment twice daily.   See #6 16.  Hypothyroidism: Synthroid 17.  Normocytic anemia.  Transfused 1 unit packed red blood cells 01/31/2020.    Hemoglobin 9.5 on 1/28, continue to monitor 18.  Hypoalbuminemia  Supplement initiated on 1/28  LOS: 1 days A FACE TO FACE EVALUATION WAS PERFORMED  Ronnika Collett Lorie Phenix 02/06/2020, 11:05 AM

## 2020-02-06 NOTE — Progress Notes (Signed)
[facility] Individual Statement of Services  Patient Name:  [name]  Date:  02/06/2020  Welcome to the [facility].  Our goal is to provide you with an individualized program based on your diagnosis and situation, designed to meet your specific needs.  With this comprehensive rehabilitation program, you will be expected to participate in at least 3 hours of rehabilitation therapies Monday-Friday, with modified therapy programming on the weekends.  Your rehabilitation program will include the following services:  Physical Therapy (PT), Occupational Therapy (OT), 24 hour per day rehabilitation nursing, Neuropsychology, Care Coordinator, Rehabilitation Medicine, Nutrition Services and Pharmacy Services  Weekly team conferences will be held on Wednesday to discuss your progress.  Your Inpatient Rehabilitation Care Coordinator will talk with you frequently to get your input and to update you on team discussions.  Team conferences with you and your family in attendance may also be held.  Expected length of stay: 8-12 days Overall anticipated outcome: supervision-CGA level  Depending on your progress and recovery, your program may change. Your Inpatient Rehabilitation Care Coordinator will coordinate services and will keep you informed of any changes. Your Inpatient Rehabilitation Care Coordinator's name and contact numbers are listed  below.  The following services may also be recommended but are not provided by the [facility]:    [facility] will be made to provide these services after discharge if needed.  Arrangements include referral to agencies that provide these services.  Your insurance has been verified to be:  Medicare & [employer] Your primary doctor is:  [name]  Pertinent information will be shared with your doctor and your insurance  company.  Inpatient Rehabilitation Care Coordinator:  Ovidio Kin, [place] or Emilia Beck  Information discussed with and copy given to patient by: Elease Hashimoto, 02/06/2020, 12:46 PM

## 2020-02-06 NOTE — Consult Note (Signed)
[facility] Nurse Consult Note: Patient receiving care in [facility] [phone] Reason for Consult: Buttock wound Wound type: Three small stage 2 wounds in the intergluteal cleft surrounded by pink non blanchable periwound. No drainage. Each open area measures 0.1 cm x 0.1 cm, yellow in color Pressure Injury POA: Yes Dressing procedure/placement/frequency: Bedside RN has applied Xeroform gauze to the open areas, secured with a sacral foam. This treatment is appropriate and should continue.  Place patient on standard size air mattress.  Use pressure redistribution chair pad when out of bed.   Monitor the wound area(s) for worsening of condition such as: Signs/symptoms of infection, increase in size, development of or worsening of odor, development of pain, or increased pain at the affected locations.   Notify the medical team if any of these develop.  Thank you for the consult. [facility] nurse will not follow at this time.   Please re-consult the [facility] team if needed.  Cathlean Marseilles Tamala Julian, MSN, RN, [place], Lysle Pearl, [facility] Wound Treatment Associate Pager [phone]

## 2020-02-06 NOTE — Progress Notes (Addendum)
Pt's specialty bed arrived. Pt refusing to get on bed states "I just was dozing off when you got here"

## 2020-02-06 NOTE — Progress Notes (Signed)
Progress Note  Patient Name: [name] Date of Encounter: 02/06/2020  [facility] Cardiologist: Sinclair Grooms, MD   Subjective   Denies dyspnea.  Inpatient Medications    Scheduled Meds: . (feeding supplement) PROSource Plus  30 mL Oral BID BM  . amoxicillin-clavulanate  1 tablet Oral Q12H  . aspirin EC  81 mg Oral Daily  . atorvastatin  40 mg Oral QHS  . budesonide-formoterol  2 puff Inhalation BID  . Chlorhexidine Gluconate Cloth  6 each Topical Daily  . cholecalciferol  2,000 Units Oral Q lunch  . enoxaparin (LOVENOX) injection  30 mg Subcutaneous Q12H  . finasteride  5 mg Oral Q lunch  . FLUoxetine  10 mg Oral Daily  . fluticasone  2 spray Each Nare Daily  . guaiFENesin  600 mg Oral BID  . levothyroxine  50 mcg Oral Daily  . metoprolol succinate  25 mg Oral Daily  . mirabegron ER  25 mg Oral QHS  . multivitamin with minerals  1 tablet Oral Daily  . pantoprazole  40 mg Oral Daily  . polyethylene glycol  17 g Oral BID  . predniSONE  10 mg Oral Daily  . saccharomyces boulardii  250 mg Oral BID  . senna-docusate  1 tablet Oral BID  . Tiotropium Bromide Monohydrate  2 puff Inhalation Daily  . vitamin B-12  1,000 mcg Oral Daily   Continuous Infusions:  PRN Meds: acetaminophen, albuterol, ALPRAZolam, bisacodyl, calcium carbonate, HYDROcodone-acetaminophen, nitroGLYCERIN, polyvinyl alcohol   Vital Signs    Vitals:   02/05/20 1927 02/06/20 0526 02/06/20 0747 02/06/20 0902  BP: 122/67 115/70    Pulse: 78 79    Resp: 16 18    Temp: 97.9 F (36.6 C) (!) 97 F (36.1 C)    TempSrc:  Oral    SpO2: 100% 95% 98%   Weight:  52.7 kg  53.9 kg    Intake/Output Summary (Last 24 hours) at 02/06/2020 1330 Last data filed at 02/06/2020 0700 Gross per 24 hour  Intake 560 ml  Output 1200 ml  Net -640 ml   Last 3 Weights 02/06/2020 02/06/2020 02/05/2020  Weight (lbs) 118 lb 13.3 oz 116 lb 2.9 oz 116 lb 2.9 oz  Weight (kg) 53.9 kg 52.7 kg 52.7 kg      Telemetry     n/a - Personally Reviewed  ECG    No new tracing- Personally Reviewed  Physical Exam  Appears comfortable, sitting up in chair GEN: No acute distress.   Neck: No JVD Cardiac: RRR, no murmurs, rubs, or gallops.  Respiratory: Clear to auscultation bilaterally. GI: Soft, nontender, non-distended  MS:  2+ pitting edema of the left foot and the lower half of the calf, status post right AKA Neuro:  Nonfocal except very hard of hearing Psych: Normal affect   Labs    High Sensitivity Troponin:   Recent Labs  Lab 01/26/20 1406 01/26/20 1536  TROPONINIHS 107* 115*      Chemistry Recent Labs  Lab 02/04/20 0129 02/05/20 0054 02/06/20 0805  NA 126* 127* 130*  K 4.1 4.4 4.2  CL 88* 90* 91*  CO2 30 27 30   GLUCOSE 85 99 120*  BUN 18 15 16   CREATININE 1.02 0.93 0.94  CALCIUM 8.7* 8.6* 9.2  PROT  --   --  5.2*  ALBUMIN  --   --  3.1*  AST  --   --  28  ALT  --   --  28  ALKPHOS  --   --  55  BILITOT  --   --  1.1  GFRNONAA >60 >60 >60  ANIONGAP 8 10 9      Hematology Recent Labs  Lab 02/04/20 0129 02/05/20 0054 02/06/20 0805  WBC 7.7 7.7 7.7  RBC 2.69* 2.76* 2.90*  HGB 8.5* 8.8* 9.5*  HCT 25.3* 25.9* 28.0*  MCV 94.1 93.8 96.6  MCH 31.6 31.9 32.8  MCHC 33.6 34.0 33.9  RDW 13.5 13.4 13.6  PLT 222 246 292    BNPNo results for input(s): BNP, PROBNP in the last 168 hours.   DDimer No results for input(s): DDIMER in the last 168 hours.   Radiology    No results found.  Cardiac Studies     ECHO 01/28/2020  1. Left ventricular ejection fraction, by estimation, is 60 to 65%. The  left ventricle has normal function. The left ventricle has no regional  wall motion abnormalities. There is mild concentric left ventricular  hypertrophy. Left ventricular diastolic  parameters are indeterminate.  2. Right ventricular systolic function is normal. The right ventricular  size is normal.  3. The mitral valve is normal in structure. No evidence of mitral valve   regurgitation. No evidence of mitral stenosis.  4. The aortic valve is tricuspid. There is mild calcification of the  aortic valve. There is mild thickening of the aortic valve. Aortic valve  regurgitation is not visualized. Mild aortic valve sclerosis is present,  with no evidence of aortic valve  stenosis.  5. The inferior vena cava is normal in size with greater than 50%  respiratory variability, suggesting right atrial pressure of 3 mmHg.   NM Myoview 2019: IMPRESSION: 1. Large infarct involving the inferior wall including the inferior apex. Difficult to exclude peri-infarct ischemia at the apex and recommend correlation with ECG testing.  2. Abnormal wall motion along the inferior wall base and septal wall as described.  3. Left ventricular ejection fraction is 48%.  4. Non invasive risk stratification*: Intermediate  Patient Profile     87 y.o. male with CAD s/p CABG 2003 (inferior scar w peri-infarct ischemia on last nuclear study 2019), chronic HF w preserved LVEF, advanced PAD, COPD on chronic home O2 2l [place], HTN s/p R AKA on 01/27/2020, now with new onset atrial flutter-to-atrial fibrillationw RVR on 02/02/2020  Assessment & Plan    1. AFib:A single 8 h episode of typical flutter, transitioning to atrial fibrillation then again a flutter, before spontaneous conversion. He is a less than optimal candidate for long-term DOAC. Increased beta blocker dose. Plan OP 30-day event monitor after discharge from Inpatient rehab. 2. CAD: Had brief problems with mild angina during tachycardia, resolved.  Beta blocker preferred agent.  3. PAD s/p R VEH:MCNOBSJG PT. He was also offered L foot amputation, but declined. 4. CHF:Has developed worsening edema of the left lower extremity after stopping diuretics, will resume furosemide and spironolactone in a lower dose. Preserved LVEF.  Keep weight under 120 pounds. 5. Hyponatremia:   Improving gradually. 6. COPD:no wheezing  despite the higher dose of blocker         For questions or updates, please contact [place] Please consult [email or web address] for contact info under        Signed, Sanda Klein, MD  02/06/2020, 1:30 PM

## 2020-02-06 NOTE — Evaluation (Signed)
Physical Therapy Assessment and Plan  Patient Details  Name: [name] MRN: [id number] Date of Birth: 10/15/1929  PT Diagnosis: Abnormal posture, Abnormality of gait, Difficulty walking and Muscle weakness Rehab Potential: Good ELOS: 7-10 days   Today's Date: 02/06/2020 PT Individual Time: 9417-4081 PT Individual Time Calculation (min): 69 min    Hospital Problem: Principal Problem:   Right above-knee amputee [facility]) Active Problems:   Normocytic anemia   Chronic diastolic congestive heart failure ([place])   Major depressive disorder with current active episode   Past Medical History:  Past Medical History:  Diagnosis Date  . Anxiety   . Arthritis    "generalized" (04/04/2017)  . Asthma   . Basal cell carcinoma    "left ear"  . BPH (benign prostatic hyperplasia)    severe; s/p multiple biopsies  . CAD (coronary artery disease)   . Carotid artery occlusion   . Chronic respiratory failure ([place])   . Chronic rhinitis   . COPD (chronic obstructive pulmonary disease) ([facility])    2L  O2  . Diastolic heart failure ([place]) 2019  . Dilation of biliary tract   . Elevated troponin 03/09/2017  . Gallstones   . GERD (gastroesophageal reflux disease)   . History of blood transfusion    "w/his CABG" (04/04/2017)  . History of kidney stones   . Hyperlipidemia   . Hypertension   . On home oxygen therapy    "~ 24/7" (04/04/2017)  . Peripheral vascular disease ([place])   . Pneumonia 2019  . Squamous cell carcinoma of skin 04/23/2013   in situ-Right flank (txpbx)  . Squamous cell carcinoma of skin 01/23/2017   [place]/ in siut- Left ear rim (CX3FU)  . Syncope and collapse   . Ureteral tumor 08/2015   had endoscopic procedure for evaluation, unable to reach for biopsy  . Urinary catheter (Foley) change required    Past Surgical History:  Past Surgical History:  Procedure Laterality Date  . ABDOMINAL AORTOGRAM W/LOWER EXTREMITY Bilateral 06/30/2019   Procedure: ABDOMINAL AORTOGRAM W/LOWER  EXTREMITY;  Surgeon: Waynetta Sandy, MD;  Location: [facility];  Service: Cardiovascular;  Laterality: Bilateral;  . AMPUTATION Right 08/11/2019   Procedure: RIGHT ABOVE KNEE AMPUTATION;  Surgeon: Angelia Mould, MD;  Location: [facility];  Service: Vascular;  Laterality: Right;  . AMPUTATION Right 01/27/2020   Procedure: RIGHT AMPUTATION ABOVE KNEE REVISION;  Surgeon: Waynetta Sandy, MD;  Location: [facility];  Service: Vascular;  Laterality: Right;  . BASAL CELL CARCINOMA EXCISION Left    ear  . CARDIAC CATHETERIZATION     "prior to bypass"  . CATARACT EXTRACTION W/ INTRAOCULAR LENS  IMPLANT, BILATERAL Bilateral   . CORONARY ARTERY BYPASS GRAFT  10/2001   LIMA to LAD, SVG to OM1-2, SVG to RCA and PDA  . LITHOTRIPSY    . PROSTATE BIOPSY  Oct. 2014  . TONSILLECTOMY      Assessment & Plan Clinical Impression: Patient is a 87 y.o. year old male with history significant for chronic urinary retention with chronic indwelling Foley, followed by [name] of urology services (foley last changed 01/16/2020), hypertension, hyperlipidemia, CAD with CABG 2003 followed by cardiology services Dr. Daneen Schick, COPD 2 L oxygen, peripheral vascular disease, diastolic congestive heart failure, sacral decubitus ulcer, right AKA 08/11/2019 receiving inpatient rehab services 08/21/2019 to 09/01/2019, and was discharged to skilled nursing facility.  Presented 01/24/2020 with left lower extremity cellulitis he had been seeing his primary care provider and maintained on antibiotics.  He was sent to the emergency department for possible admission for cellulitis and initially sent home on doxycycline.  He was later seen at the wound clinic for his left foot continued to worsen despite antibiotics.  Vascular surgery follow-up Dr. Donzetta Matters and on examination was noted to have open wound of the right AKA stump with exposed bone.  X-rays obtained showing some demineralization but no osteomyelitis.  MRI was  ordered to evaluate osteomyelitis showing no areas of abnormal marrow signal or cortical destruction.  Patient did undergo revision of right AKA 01/27/2020 per Dr. Donzetta Matters.  Work-up of left lower extremity cellulitis MRSA screen negative blood cultures no growth he did complete 7 days antibiotics but left foot looked worse. ESR/CRP slightly elevated placed on Augmentin 01/29/2020 to be completed 02/05/2020 with improvement in latest WBC 7.7.  Patient had declined amputation of left foot.  Hospital course patient has had some hyponatremia 130-126.  Cardiology services consulted during hospital stay for episodic chest pain/atrial flutter with RVR of new onset.  Placed on Toprol not a candidate for long-term DOAC.  30-day event monitor planned at discharge.  Currently holding Lasix and Aldactone due to hyponatremia and resume as needed.  Palliative care was consulted to establish goals of care.  Patient with decubitus ulcer buttocks. Santyl ointment twice daily with wound care nurse follow-up.  Random Covid testing 02/04/2020 negative.  Therapy evaluations completed due to patient decrease in functional ability and he was recommended for a comprehensive rehab program.  Patient currently requires min with mobility secondary to muscle weakness, decreased cardiorespiratoy endurance and decreased standing balance, decreased postural control, decreased balance strategies and difficulty maintaining precautions.  Prior to hospitalization, patient was modified independent  with mobility and lived with Spouse in a House home.  Home access is  Level entry.  Patient will benefit from skilled PT intervention to maximize safe functional mobility, minimize fall risk and decrease caregiver burden for planned discharge home with 24 hour assist.  Anticipate patient will benefit from follow up [facility] at discharge.  PT - End of Session Activity Tolerance: Tolerates 30+ min activity with multiple rests Endurance Deficit: Yes Endurance  Deficit Description: requires frequent rest breaks PT Assessment Rehab Potential (ACUTE/IP ONLY): Good PT Barriers to Discharge: [place] home environment;Home environment access/layout;Wound Care;Weight bearing restrictions;Behavior PT Barriers to Discharge Comments: multiple sets of stairs in home (but pt has stair lift), NWB RLE, poor motivation PT Patient demonstrates impairments in the following area(s): Balance;Behavior;Endurance;Motor;Pain;Skin Integrity PT Transfers Functional Problem(s): Bed Mobility;Bed to Chair;Car;Furniture PT Locomotion Functional Problem(s): Ambulation;Wheelchair Mobility;Stairs PT Plan PT Intensity: Minimum of 1-2 x/day ,45 to 90 minutes PT Frequency: 5 out of 7 days PT Duration Estimated Length of Stay: 7-10 days PT Treatment/Interventions: Ambulation/gait training;Discharge planning;Functional mobility training;Psychosocial support;Therapeutic Activities;Balance/vestibular training;Disease management/prevention;Neuromuscular re-education;Skin care/wound management;Therapeutic Exercise;Wheelchair propulsion/positioning;Cognitive remediation/compensation;DME/adaptive equipment instruction;Pain management;Splinting/orthotics;UE/LE Strength taining/ROM;Community reintegration;Functional electrical stimulation;Patient/family education;Stair training;UE/LE Coordination activities PT Transfers Anticipated Outcome(s): supervision PT Locomotion Anticipated Outcome(s): CGA PT Recommendation Recommendations for Other Services: Neuropsych consult Follow Up Recommendations: Home health PT Patient destination: Home Equipment Recommended: To be determined Equipment Details: pt's wife reports pt will need new WC  PT Evaluation Precautions/Restrictions Precautions Precautions: Fall Precaution Comments: H/o R AKA (s/p revision 1/18) Restrictions Weight Bearing Restrictions: Yes RLE Weight Bearing: Non weight bearing Home Living/Prior [place] expects to be discharged to:: Private residence Living Arrangements: Spouse/significant other Available Help at Discharge: Family;Available 24 hours/day (daughter lives next door) Type of Home: House Home Access: Level entry Home Layout:  Multi-level;Bed/bath upstairs Alternate Level Stairs-Number of Steps: Enters from carport with level entry. 9 to kitchen/dining room/living room. 6 additional steps to bedroom/bathroom with 3 bedrooms and 2 bathrooms with 1 rail with each set of stairs.  Has stair lift on each set of stairs. Alternate Level Stairs-Rails: Right Bathroom Shower/Tub: Walk-in shower;Tub/shower unit Bathroom Toilet: Standard Bathroom Accessibility: Yes Additional Comments: O2 2L, tub bench, drop arm BSC, stair lift  Lives With: Spouse Prior Function Level of Independence: Requires assistive device for independence;Needs assistance with ADLs  Able to Take Stairs?:  (stair lift) Driving: No Comments: has been getting HHPT and HHOT Cognition Overall Cognitive Status: Difficult to assess (difficult to assess due to [facility] and requiring increased time) Arousal/Alertness: Awake/alert Orientation Level: Oriented X4 Memory: Appears intact Awareness: Appears intact Problem Solving: Appears intact Safety/Judgment: Appears intact Sensation Sensation Light Touch: Appears Intact Proprioception: Impaired by gross assessment Coordination Gross Motor Movements are Fluid and Coordinated: No Fine Motor Movements are Fluid and Coordinated: Not tested Coordination and Movement Description: grossly uncoordinated due to R AKA, decreased balance/postural control, generalized weakness, and fatigue Motor  Motor Motor: Abnormal postural alignment and control Motor - Skilled Clinical Observations: grossly uncoordinated due to R AKA, decreased balance/postural control, generalized weakness, and fatigue  Trunk/Postural Assessment  Cervical Assessment Cervical Assessment:  Exceptions to [facility] (forward head) Thoracic Assessment Thoracic Assessment: Exceptions to [facility] (rounded shoulders) Lumbar Assessment Lumbar Assessment: Exceptions to [facility] (posterior pelvic tilt) Postural Control Postural Control: Deficits on evaluation  Balance Balance Balance Assessed: Yes Static Sitting Balance Static Sitting - Balance Support: Feet supported;Bilateral upper extremity supported Static Sitting - Level of Assistance: 5: Stand by assistance (supervision) Dynamic Sitting Balance Dynamic Sitting - Balance Support: Feet supported;Bilateral upper extremity supported (supervision) Static Standing Balance Static Standing - Balance Support: Bilateral upper extremity supported (parallel bars) Static Standing - Level of Assistance: 5: Stand by assistance (CGA) Dynamic Standing Balance Dynamic Standing - Balance Support: Bilateral upper extremity supported (parallel bars) Dynamic Standing - Level of Assistance: 4: Min assist Extremity Assessment  RLE Assessment RLE Assessment: Exceptions to [facility] General Strength Comments: not formally assessed; grossly 3+/5 LLE Assessment LLE Assessment: Exceptions to [facility] General Strength Comments: not formally assessed; grossly 4-/5  Care Tool Care Tool Bed Mobility Roll left and right activity   Roll left and right assist level: Contact Guard/Touching assist    Sit to lying activity        Lying to sitting edge of bed activity   Lying to sitting edge of bed assist level: Contact Guard/Touching assist     Care Tool Transfers Sit to stand transfer Sit to stand activity did not occur: Safety/medical concerns Sit to stand assist level: Minimal Assistance - Patient > 75%    Chair/bed transfer Chair/bed transfer activity did not occur: Refused Chair/bed transfer assist level: Minimal Assistance - Patient > 75%     Toilet transfer Toilet transfer activity did not occur: Social worker transfer activity did not occur:  Safety/medical concerns (fatigue, generalized weakness, poor endurance)        Care Tool Locomotion Ambulation   Assist level: 2 helpers Assistive device: Parallel bars Max distance: 24f  Walk 10 feet activity Walk 10 feet activity did not occur: Safety/medical concerns (fatigue, generalized weakness, poor endurance)       Walk 50 feet with 2 turns activity Walk 50 feet with 2 turns activity did not occur: Safety/medical concerns (fatigue, generalized weakness, poor endurance)  Walk 150 feet activity Walk 150 feet activity did not occur: Safety/medical concerns (fatigue, generalized weakness, poor endurance)      Walk 10 feet on uneven surfaces activity Walk 10 feet on uneven surfaces activity did not occur: Safety/medical concerns (fatigue, generalized weakness, poor endurance)      Stairs Stair activity did not occur: Safety/medical concerns (fatigue, generalized weakness, poor endurance)        Walk up/down 1 step activity Walk up/down 1 step or curb (drop down) activity did not occur: Safety/medical concerns (fatigue, generalized weakness, poor endurance)     Walk up/down 4 steps activity did not occuR: Safety/medical concerns (fatigue, generalized weakness, poor endurance)  Walk up/down 4 steps activity      Walk up/down 12 steps activity Walk up/down 12 steps activity did not occur: Safety/medical concerns (fatigue, generalized weakness, poor endurance)      Pick up small objects from floor Pick up small object from the floor (from standing position) activity did not occur: Safety/medical concerns (fatigue, generalized weakness, poor endurance, decreased balance)      Wheelchair Will patient use wheelchair at discharge?: Yes Type of Wheelchair: Manual   Wheelchair assist level: Supervision/Verbal cueing Max wheelchair distance: 51f  Wheel 50 feet with 2 turns activity   Assist Level: Supervision/Verbal cueing  Wheel 150 feet activity   Assist Level: Maximal  Assistance - Patient 25 - 49%    Refer to Care Plan for Long Term Goals  SHORT TERM GOAL WEEK 1 PT Short Term Goal 1 (Week 1): STG=LTG due to LOS  Recommendations for other services: Neuropsych  Skilled Therapeutic Intervention Evaluation completed (see details above and below) with education on PT POC and goals and individual treatment initiated with focus on functional mobility/transfers, generalized strengthening, and improved activity tolerance. Received pt supine in bed with wife present at bedside, pt educated on PT evaluation, CIR policies, and therapy schedule. Of note, pt is HOH. Pt did not report pain during session. Pt on 2L O2 via [place] with O2 sat 97% and HR 77bpm. Therapist provided pt with 16x16 manual WC with R amputee support pad. Pt's brought his own Roho cushion and therapist adjusted air pressure in cushion. Pt transferred supine<>sitting EOB with HOB elevated and use of bedrails with CGA and reported minor dizziness (baseline). Pt transferred bed<>WC via lateral scoot with min A and increased time with cues for hand placement. RN present to discuss options for air mattress; pt's wife ultimately refusing air mattress because pt hated it last time he was here. O2 97% HR 83bpm after transfer. Pt performed WC mobility 727fusing BUE and supervision but stopped due to UE fatigue. Pt transported remainder of [facility] and transferred sit<>stand in // bars with min A and ambulated [id number] "hopping" technique with min A +2 for WC follow. [facility] Concluded session with pt sitting in WC, needs within reach, and seatbelt alarm on. Wife present at bedside. Safety plan updated.   Mobility Bed Mobility Bed Mobility: Rolling Right;Right Sidelying to Sit Rolling Right: Contact Guard/Touching assist Right Sidelying to Sit: Contact Guard/Touching assist Transfers Transfers: Sit to Stand;Stand to Sit;Lateral/Scoot Transfers Sit to Stand:  Minimal Assistance - Patient > 75% Stand to Sit: Contact Guard/Touching assist Lateral/Scoot Transfers: Minimal Assistance - Patient > 75% Transfer (Assistive device): Other (Comment) (parallel bars) Locomotion  Gait Ambulation: Yes Gait Assistance: 2 Helpers Gait Distance (Feet): 3 Feet Assistive device: Parallel bars  Gait Assistance Details: Verbal cues for technique;Verbal cues for gait pattern Gait Assistance Details: verbal cues to use upper body Gait Gait: Yes Gait Pattern: Impaired Gait Pattern: Step-to pattern;Decreased stride length;Decreased step length - left;Poor foot clearance - left Gait velocity: decreased Wheelchair Mobility Wheelchair Mobility: Yes Wheelchair Assistance: Chartered loss adjuster: Both upper extremities Wheelchair Parts Management: Needs assistance Distance: 58f   Discharge Criteria: Patient will be discharged from PT if patient refuses treatment 3 consecutive times without medical reason, if treatment goals not met, if there is a change in medical status, if patient makes no progress towards goals or if patient is discharged from hospital.  The above assessment, treatment plan, treatment alternatives and goals were discussed and mutually agreed upon: by patient and by family  AAlfonse AlpersPT, DPT  02/06/2020, 12:12 PM

## 2020-02-06 NOTE — Progress Notes (Addendum)
Inpatient Rehabilitation Care Coordinator Assessment and Plan Patient Details  Name: [name] MRN: [id number] Date of Birth: [birth year]  Today's Date: 02/06/2020  Hospital Problems: Principal Problem:   Right above-knee amputee [facility]) Active Problems:   Normocytic anemia   Chronic diastolic congestive heart failure ([place])   Major depressive disorder with current active episode  Past Medical History:  Past Medical History:  Diagnosis Date  . Anxiety   . Arthritis    "generalized" (04/04/2017)  . Asthma   . Basal cell carcinoma    "left ear"  . BPH (benign prostatic hyperplasia)    severe; s/p multiple biopsies  . CAD (coronary artery disease)   . Carotid artery occlusion   . Chronic respiratory failure ([place])   . Chronic rhinitis   . COPD (chronic obstructive pulmonary disease) (HCC)    2L [place] O2  . Diastolic heart failure ([place]) 2019  . Dilation of biliary tract   . Elevated troponin 03/09/2017  . Gallstones   . GERD (gastroesophageal reflux disease)   . History of blood transfusion    "w/his CABG" (04/04/2017)  . History of kidney stones   . Hyperlipidemia   . Hypertension   . On home oxygen therapy    "~ 24/7" (04/04/2017)  . Peripheral vascular disease ([place])   . Pneumonia 2019  . Squamous cell carcinoma of skin 04/23/2013   in situ-Right flank (txpbx)  . Squamous cell carcinoma of skin 01/23/2017   [place]/ in siut- Left ear rim (CX3FU)  . Syncope and collapse   . Ureteral tumor 08/2015   had endoscopic procedure for evaluation, unable to reach for biopsy  . Urinary catheter (Foley) change required    Past Surgical History:  Past Surgical History:  Procedure Laterality Date  . ABDOMINAL AORTOGRAM W/LOWER EXTREMITY Bilateral 06/30/2019   Procedure: ABDOMINAL AORTOGRAM W/LOWER EXTREMITY;  Surgeon: Waynetta Sandy, MD;  Location: [place] [facility];  Service: Cardiovascular;  Laterality: Bilateral;  . AMPUTATION Right 08/11/2019   Procedure: RIGHT ABOVE  KNEE AMPUTATION;  Surgeon: Angelia Mould, MD;  Location: [place];  Service: Vascular;  Laterality: Right;  . AMPUTATION Right 01/27/2020   Procedure: RIGHT AMPUTATION ABOVE KNEE REVISION;  Surgeon: Waynetta Sandy, MD;  Location: [place];  Service: Vascular;  Laterality: Right;  . BASAL CELL CARCINOMA EXCISION Left    ear  . CARDIAC CATHETERIZATION     "prior to bypass"  . CATARACT EXTRACTION W/ INTRAOCULAR LENS  IMPLANT, BILATERAL Bilateral   . CORONARY ARTERY BYPASS GRAFT  10/2001   LIMA to LAD, SVG to OM1-2, SVG to RCA and PDA  . LITHOTRIPSY    . PROSTATE BIOPSY  Oct. 2014  . TONSILLECTOMY     Social History:  reports that he quit smoking about 37 years ago. His smoking use included cigarettes. He has a 66.00 pack-year smoking history. He has never used smokeless tobacco. He reports current alcohol use. He reports that he does not use drugs.  Family / Support Systems Marital Status: Married Patient Roles: Spouse,Parent Spouse/Significant Other: Tamela Oddi [phone] Children: Alyssa-daughter [phone] lives across the street, [place] Anticipated Caregiver: [name] Ability/Limitations of Caregiver: Supervision-min assist but has been hard on her his care she has lost 40 lbs since 08/2019 Caregiver Availability: 24/7 Family Dynamics: Close with both of their children, and daughter tries to help out when she can. She does work at [employer] and is mostly home but has to go to [employer] at times. Majority of assist falls on wife.  Social History Preferred language: English Religion: Episcopalian Cultural Background: NO issues Education: Secretary/administrator educated Read: Yes Write: Yes Employment Status: Retired Public relations account executive Issues: NO issues Guardian/Conservator: None-according to MD pt is capable of making his own decisions while here. Wife is here daily and will participate due to how [facility] pt is.   Abuse/Neglect Abuse/Neglect Assessment Can Be  Completed: Yes Physical Abuse: Denies Verbal Abuse: Denies Sexual Abuse: Denies Exploitation of patient/patient's resources: Denies Self-Neglect: Denies  Emotional Status Pt's affect, behavior and adjustment status: Pt is very HOH and you have to yell at him to hear you. He was doing well until this and hopes to progress here and gat back home. Wife assists him and has her own health issues. She needs to be careful with her own health also. Recent Psychosocial Issues: other health issues Psychiatric History: History of depression and has been tlaking with a family member who is a psychiatrist and recommends him taking an antidepressant and MD prescribed but he wants to check with family member and decide before he starts taking it. Bedside RN aware of this. Encoruaged him to take it here so he can be monitored. Will ask neruo-psych to see while here Substance Abuse History: NO issues  Patient / Family Perceptions, Expectations & Goals Pt/Family understanding of illness & functional limitations: Pt and wife can explain his revision and hopes this is it for him. They have been through this before and hope this is the last time he will need surgery. They talk with MD and feel they have a good understanding of his treatment plan going forward. Premorbid pt/family roles/activities: Husband, father, retiree, friend, etc Anticipated changes in roles/activities/participation: resume Pt/family expectations/goals: Pt states: " I want to do well and want to think about the antidepressant."  Wife states: " I am glad he is here and he will not go back to a nursing home."  [employer]: Other (Comment) Premorbid Home Care/DME Agencies: Other (Comment) ([facility] active with and has home o2, wc, stair lifts at home) Transportation available at discharge: Wife Resource referrals recommended: Neuropsychology  Discharge Planning Living Arrangements: Spouse/significant other Support  Systems: Spouse/significant other,Children,Other relatives Type of Residence: Private residence Insurance Resources: [employer] (specify) Scientist, clinical (histocompatibility and immunogenetics)) Financial Resources: Scientist, physiological (Comment) Financial Screen Referred: No Living Expenses: Own Money Management: Patient,Spouse Does the patient have any problems obtaining your medications?: No Home Management: wife Patient/Family Preliminary Plans: Return home with wife who is his primary caregiver. Pt is extremely HOH and mises conversations even when yelling at him. Wife is here daily and assists with this and will provide assist at home. Will await team evaluations Care Coordinator Barriers to Discharge: Decreased caregiver support Care Coordinator Barriers to Discharge Comments: Wife has health issues of her own Care Coordinator Anticipated Follow Up Needs: HH/OP  Clinical Impression Very HOH patient who tries to particiapte in the conversation, his wife who is here daily assists him. Both are pleased he was able to come here since he was here in 08/2019 and did well. Plan for home with wife and home health-[facility] which was coming out prior to admission. Pt familiar with process here on rehab. Await team evaluations. Have asked neruo-psych to see for coping and assist with best antidepressant for him to try. Wife aware of my absence next week and a covering SW will follow up with them after team conference.  Elease Hashimoto 02/06/2020, 12:44 PM

## 2020-02-06 NOTE — Progress Notes (Signed)
Occupational Therapy Session Note  Patient Details  Name: [name] MRN: [id number] Date of Birth: 31-Oct-1929  Today's Date: 02/06/2020 OT Individual Time: 0272-5366 OT Individual Time Calculation (min): 45 min    Short Term Goals: Week 1:  OT Short Term Goal 1 (Week 1): Pt will complete toilet transfers with mod assist OT Short Term Goal 2 (Week 1): Pt will complete LB dressing mod assist at sit > stand level OT Short Term Goal 3 (Week 1): Pt will complete toileting tasks with mod assist  Skilled Therapeutic Interventions/Progress Updates:      1:1. Pt received in bed agreeable to OT. Pt completes use of inhaler with increased time and set up. Pt reporting fatigue but willing to work to best ability. Pt sits to EOB with CGA overall pt completes squat pivot transfer into/out of bed with MIN A in B direction and A for w/c parts set up. Pt completes 1 sit to stand with MIN-MOD A overall for power up into standing maintianing stance for 30-40 seconds. Pt requries pursed lip breathing and O2 support via [place] on 2L. Pt scoots to [facility] after returning from w/c with CGA. Exited session with pt seated in bed, exit alarm on and call light in reach    Therapy Documentation Precautions:  Restrictions Weight Bearing Restrictions: Yes RLE Weight Bearing: Non weight bearing General:   Vital Signs: Therapy Vitals Temp: (!) 97 F (36.1 C) Temp Source: Oral Pulse Rate: 79 Resp: 18 BP: 115/70 Patient Position (if appropriate): Lying Oxygen Therapy SpO2: 95 % O2 Device: CPAP Pain:   ADL:   Vision   Perception    Praxis   Exercises:   Other Treatments:     Therapy/Group: Individual Therapy  Tonny Branch 02/06/2020, 6:54 AM

## 2020-02-06 NOTE — Progress Notes (Signed)
Inpatient Rehabilitation  Patient information reviewed and entered into eRehab system by Shatera Rennert M. Saraiyah Hemminger, M.A., CCC/SLP, PPS Coordinator.  Information including medical coding, functional ability and quality indicators will be reviewed and updated through discharge.    

## 2020-02-06 NOTE — Progress Notes (Signed)
Ordered air mattress for pt d/t wound to buttocks, pt/wife refused bed, educated, still refused

## 2020-02-07 LAB — BASIC METABOLIC PANEL
Anion gap: 6 (ref 5–15)
BUN: 14 mg/dL (ref 8–23)
CO2: 29 mmol/L (ref 22–32)
Calcium: 8.6 mg/dL — ABNORMAL LOW (ref 8.9–10.3)
Chloride: 93 mmol/L — ABNORMAL LOW (ref 98–111)
Creatinine, Ser: 0.8 mg/dL (ref 0.61–1.24)
GFR, Estimated: >60 mL/min (ref >60)
Glucose, Bld: 82 mg/dL (ref 70–99)
Potassium: 3.7 mmol/L (ref 3.5–5.1)
Sodium: 128 mmol/L — ABNORMAL LOW (ref 135–145)

## 2020-02-07 MED ORDER — POLYETHYLENE GLYCOL 3350 17 G PO PACK: 17.0000 g | PACK | Freq: Every day | ORAL | Status: DC | PRN

## 2020-02-07 MED ORDER — MIRABEGRON ER 25 MG PO TB24
25.0000 mg | ORAL_TABLET | Freq: Every day | ORAL | Status: DC
Start: 2020-02-07 — End: 2020-02-16
  Administered 2020-02-07 – 2020-02-16 (×10): 25 mg via ORAL
  Filled 2020-02-07 (×14): qty 1

## 2020-02-07 NOTE — Progress Notes (Signed)
Patient complained of bladder fullness in-spite of having foley cath. Drainage bag was emptied and no urine was noted flowing into the bag.  Bladder scanned was 49' Patient stated that he is going to void and he did. Urine leaked around the foley. PVR was 187. Balloon was checked and has 10cc of water. Dr. Read Drivers was informed with an order to irrigate foley. Salena Saner, RN flushed foley with 40cc sterile normal saline. Foley drained back 125cc. More urine was noted flowing into the bag.

## 2020-02-07 NOTE — Progress Notes (Addendum)
[place] PHYSICAL MEDICINE & REHABILITATION PROGRESS NOTE  Subjective/Complaints:  Concerned about loose stools no abd pain, started after laxative use Off AUgmentin x 2 d Also would like Myrbetriq to be given in am  ROS: + Baseline shortness of breath.  Denies CP, N/V/D  Objective: Vital Signs: Blood pressure 121/72, pulse 79, temperature 98.3 F (36.8 C), resp. rate 16, height 5\' 8"  (1.727 m), weight 51.5 kg, SpO2 98 %. No results found. Recent Labs    02/05/20 0054 02/06/20 0805  WBC 7.7 7.7  HGB 8.8* 9.5*  HCT 25.9* 28.0*  PLT 246 292   Recent Labs    02/06/20 0805 02/07/20 0619  NA 130* 128*  K 4.2 3.7  CL 91* 93*  CO2 30 29  GLUCOSE 120* 82  BUN 16 14  CREATININE 0.94 0.80  CALCIUM 9.2 8.6*    Intake/Output Summary (Last 24 hours) at 02/07/2020 0912 Last data filed at 02/07/2020 1761 Gross per 24 hour  Intake 240 ml  Output 1750 ml  Net -1510 ml     Pressure Injury 06/27/19 Coccyx Posterior;Mid Stage 1 -  Intact skin with non-blanchable redness of a localized area usually over a bony prominence. (Active)  06/27/19 2300  Location: Coccyx  Location Orientation: Posterior;Mid  Staging: Stage 1 -  Intact skin with non-blanchable redness of a localized area usually over a bony prominence.  Wound Description (Comments):   Present on Admission: Yes     Pressure Injury 08/20/19 Coccyx Mid Stage 2 -  Partial thickness loss of dermis presenting as a shallow open injury with a red, pink wound bed without slough. (Active)  08/20/19 2200  Location: Coccyx  Location Orientation: Mid  Staging: Stage 2 -  Partial thickness loss of dermis presenting as a shallow open injury with a red, pink wound bed without slough.  Wound Description (Comments):   Present on Admission: Yes     Pressure Injury 08/27/19 Buttocks Medial (Active)  08/27/19 1030  Location: Buttocks  Location Orientation: Medial  Staging:   Wound Description (Comments):   Present on Admission:       Pressure Injury 01/24/20 Coccyx Mid;Lower Stage 2 -  Partial thickness loss of dermis presenting as a shallow open injury with a red, pink wound bed without slough. (Active)  01/24/20 1445  Location: Coccyx  Location Orientation: Mid;Lower  Staging: Stage 2 -  Partial thickness loss of dermis presenting as a shallow open injury with a red, pink wound bed without slough.  Wound Description (Comments):   Present on Admission: Yes    Physical Exam: BP 121/72 (BP Location: Left Arm)   Pulse 79   Temp 98.3 F (36.8 C)   Resp 16   Ht 5\' 8"  (1.727 m)   Wt 51.5 kg   SpO2 98%   BMI 17.26 kg/m   General: No acute distress Mood and affect are appropriate Heart: Regular rate and rhythm no rubs murmurs or extra sounds Lungs: Clear to auscultation, breathing unlabored, no rales or wheezes Abdomen: Positive bowel sounds, soft nontender to palpation, nondistended Extremities: No clubbing, cyanosis, or edema Skin: No evidence of breakdown, no evidence of rash   GU: + Foley. Skin: Warm and dry.   Right AKA with erythema along staple lines Sacral ulcer not examined today. Psych: Normal mood.  Normal behavior. Musc:  Right AKA with edema and tenderness Left lower extremity edema Neuro: Alert Very HOH Motor: Bilateral upper extremities: 5/5 proximal distal Right lower extremity: Hip flexion 3+/5 (pain inhibition)AKA  Left lower extremity: Hip flexion, knee extension, ankle dorsiflexion 4 -/5 Skin- redenss at right foot ankle , scaly skin no weeping, (mild stasis dermatitis )  Assessment/Plan: 1. Functional deficits which require 3+ hours per day of interdisciplinary therapy in a comprehensive inpatient rehab setting.  Physiatrist is providing close team supervision and 24 hour management of active medical problems listed below.  Physiatrist and rehab team continue to assess barriers to discharge/monitor patient progress toward functional and medical goals   Care Tool:  Bathing     Body parts bathed by patient: Right arm,Left arm,Chest,Abdomen,Front perineal area,Left upper leg,Face   Body parts bathed by helper: Buttocks,Left lower leg Body parts n/a: Right upper leg,Right lower leg   Bathing assist Assist Level: Moderate Assistance - Patient 50 - 74%     Upper Body Dressing/Undressing Upper body dressing   What is the patient wearing?: Hospital gown only    Upper body assist Assist Level: Minimal Assistance - Patient > 75%    Lower Body Dressing/Undressing Lower body dressing      What is the patient wearing?: Underwear/pull up     Lower body assist Assist for lower body dressing: Maximal Assistance - Patient 25 - 49%     Toileting Toileting    Toileting assist       Transfers Chair/bed transfer  Transfers assist  Chair/bed transfer activity did not occur: Refused  Chair/bed transfer assist level: 2 Helpers     Locomotion Ambulation   Ambulation assist      Assist level: 2 helpers Assistive device: Parallel bars Max distance: 72ft   Walk 10 feet activity   Assist  Walk 10 feet activity did not occur: Safety/medical concerns (fatigue, generalized weakness, poor endurance)        Walk 50 feet activity   Assist Walk 50 feet with 2 turns activity did not occur: Safety/medical concerns (fatigue, generalized weakness, poor endurance)         Walk 150 feet activity   Assist Walk 150 feet activity did not occur: Safety/medical concerns (fatigue, generalized weakness, poor endurance)         Walk 10 feet on uneven surface  activity   Assist Walk 10 feet on uneven surfaces activity did not occur: Safety/medical concerns (fatigue, generalized weakness, poor endurance)         Wheelchair     Assist Will patient use wheelchair at discharge?: Yes Type of Wheelchair: Manual    Wheelchair assist level: Supervision/Verbal cueing Max wheelchair distance: 88ft    Wheelchair 50 feet with 2 turns  activity    Assist        Assist Level: Supervision/Verbal cueing   Wheelchair 150 feet activity     Assist      Assist Level: Maximal Assistance - Patient 25 - 49%    Medical Problem List and Plan: 1.  Decreased functional mobility secondary to cellulitis left foot as well as right AKA revision 01/27/2020  CIR PT, OT 2.  Antithrombotics: -DVT/anticoagulation:Lovenox               -antiplatelet therapy: Aspirin 81 mg daily 3. Pain Management: Tylenol prn per wife   Monitor with increased exertion. 4. Mood: Provide emotional support  Fluoxetine started on 1/28 for depression             -antipsychotic agents: N/A 5. Neuropsych: This patient is?  Fully capable of making decisions on his own behalf. 6. Skin/Wound decubitus to coccyx and R AKA.  Skin care as  directed.  Routine skin checks- dressing to be changed BID and prn  Monitor wounds 7. Fluids/Electrolytes/Nutrition: Routine in and outs. 8.  ID/cellulitis left foot.  MRSA screen negative.  Blood cultures no growth to date.  Currently completing course of Augmentin 02/05/2020 9.  Atrial flutter/A. fib with RVR.  Toprol 25 mg daily.  Not a candidate for long-term DOAC.  30-day event monitor as outpatient.  Follow-up cardiology services  Monitor with increased activity 10.  Hyponatremia.  Lasix and Aldactone currently on hold- per Cards for today with plans to resume tomorrow.    Sodium 130 on 1/28, labs ordered for tomorrow  Restart sodium chloride tabs as necessary 11.  Chronic diastolic congestive heart failure.    Lasix and Aldactone currently on hold, plan to restart tomorrow  Followed by cardiology services  Monitor for any signs of fluid overload Filed Weights   02/06/20 0526 02/06/20 0902 02/07/20 0347  Weight: 52.7 kg 53.9 kg 51.5 kg   12.  Chronic respiratory failure with hypoxia/COPD.  Chronic oxygen therapy 2 L.  Continue chronic prednisone. 13.  Chronic urinary retention.  Chronic Foley tube followed by  urology service [name].  Continue Myrbetriq as well as Proscar.  Change Foley catheter to routinely- last changed 01/16/2020- .may flush Change Myrbetriq to qam  14.  GERD.  Protonix 15.  Chronic sacral decubitus.  Santyl ointment twice daily.   See #6 16.  Hypothyroidism: Synthroid 17.  Normocytic anemia.  Transfused 1 unit packed red blood cells 01/31/2020.    Hemoglobin 9.5 on 1/28, continue to monitor 18.  Hypoalbuminemia  Supplement initiated on 1/28 19.  Hx constipation now with loose stool, change miralax to prn, cont senna, don't think augmentin is an issue (d/ced 1/27) LOS: 2 days A FACE TO FACE EVALUATION WAS PERFORMED  Charlett Blake 02/07/2020, 9:12 AM

## 2020-02-08 ENCOUNTER — Inpatient Hospital Stay (HOSPITAL_COMMUNITY): Payer: Medicare Other

## 2020-02-08 LAB — CBC
HCT: 26.1 % — ABNORMAL LOW (ref 39.0–52.0)
Hemoglobin: 8.5 g/dL — ABNORMAL LOW (ref 13.0–17.0)
MCH: 31.6 pg (ref 26.0–34.0)
MCHC: 32.6 g/dL (ref 30.0–36.0)
MCV: 97 fL (ref 80.0–100.0)
Platelets: 263 10*3/uL (ref 150–400)
RBC: 2.69 MIL/uL — ABNORMAL LOW (ref 4.22–5.81)
RDW: 13.9 % (ref 11.5–15.5)
WBC: 7.3 10*3/uL (ref 4.0–10.5)
nRBC: 0 % (ref 0.0–0.2)

## 2020-02-08 MED ORDER — SENNOSIDES-DOCUSATE SODIUM 8.6-50 MG PO TABS: 1.0000 | ORAL_TABLET | Freq: Every evening | ORAL | Status: DC | PRN

## 2020-02-08 MED ORDER — ENOXAPARIN SODIUM 40 MG/0.4ML ~~LOC~~ SOLN
40.0000 mg | SUBCUTANEOUS | Status: DC
  Administered 2020-02-09 – 2020-02-16 (×8): 40 mg via SUBCUTANEOUS
  Filled 2020-02-08 (×8): qty 0.4

## 2020-02-08 NOTE — Progress Notes (Addendum)
[name] PHYSICAL MEDICINE & REHABILITATION PROGRESS NOTE  Subjective/Complaints: Wife at bedside with multiple questions.  She states that Foley is changed on a monthly basis at the urology office by the urology PA.  She states that the next change is due on 02/13/2020, we discussed that the patient would likely still be an inpatient on the rehab unit at that time.  We discussed that primary service PA can call over to urology office and see how they would like to proceed. One cont soft stool today  Concerned about blood in urine, discussed Lovenox dosing as well as blood work, hemoglobin down to 8.5 from 9.52 days ago, urine appears blood-tinged, no clots Some increased shortness of breath this morning required Xopenex inhaler in addition to Symbicort. Patient is also concerned about loose stools but he only has 1 to 2/day he is concerned that it may be related to his antibiotics but they have discontinued a couple days ago.  No abdominal pain ROS: + Baseline shortness of breath.  Denies CP, N/V/D  Objective: Vital Signs: Blood pressure 125/73, pulse 75, temperature 97.6 F (36.4 C), temperature source Oral, resp. rate 16, height 5\' 8"  (1.727 m), weight 51.7 kg, SpO2 98 %. No results found. Recent Labs    02/06/20 0805 02/08/20 0502  WBC 7.7 7.3  HGB 9.5* 8.5*  HCT 28.0* 26.1*  PLT 292 263   Recent Labs    02/06/20 0805 02/07/20 0619  NA 130* 128*  K 4.2 3.7  CL 91* 93*  CO2 30 29  GLUCOSE 120* 82  BUN 16 14  CREATININE 0.94 0.80  CALCIUM 9.2 8.6*    Intake/Output Summary (Last 24 hours) at 02/08/2020 0944 Last data filed at 02/08/2020 0720 Gross per 24 hour  Intake 410 ml  Output 1450 ml  Net -1040 ml     Pressure Injury 06/27/19 Coccyx Posterior;Mid Stage 1 -  Intact skin with non-blanchable redness of a localized area usually over a bony prominence. (Active)  06/27/19 2300  Location: Coccyx  Location Orientation: Posterior;Mid  Staging: Stage 1 -  Intact skin  with non-blanchable redness of a localized area usually over a bony prominence.  Wound Description (Comments):   Present on Admission: Yes     Pressure Injury 08/20/19 Coccyx Mid Stage 2 -  Partial thickness loss of dermis presenting as a shallow open injury with a red, pink wound bed without slough. (Active)  08/20/19 2200  Location: Coccyx  Location Orientation: Mid  Staging: Stage 2 -  Partial thickness loss of dermis presenting as a shallow open injury with a red, pink wound bed without slough.  Wound Description (Comments):   Present on Admission: Yes     Pressure Injury 08/27/19 Buttocks Medial (Active)  08/27/19 1030  Location: Buttocks  Location Orientation: Medial  Staging:   Wound Description (Comments):   Present on Admission:      Pressure Injury 01/24/20 Coccyx Mid;Lower Stage 2 -  Partial thickness loss of dermis presenting as a shallow open injury with a red, pink wound bed without slough. (Active)  01/24/20 1445  Location: Coccyx  Location Orientation: Mid;Lower  Staging: Stage 2 -  Partial thickness loss of dermis presenting as a shallow open injury with a red, pink wound bed without slough.  Wound Description (Comments):   Present on Admission: Yes    Physical Exam: BP 125/73 (BP Location: Left Arm)   Pulse 75   Temp 97.6 F (36.4 C) (Oral)   Resp 16  Ht 5\' 8"  (1.727 m)   Wt 51.7 kg   SpO2 98%   BMI 17.33 kg/m   General: No acute distress Mood and affect are appropriate Heart: Regular rate and rhythm no rubs murmurs or extra sounds Lungs: Clear to auscultation, breathing unlabored, no rales or wheezes Abdomen: Positive bowel sounds, soft nontender to palpation, nondistended Extremities: No clubbing, cyanosis, or edema Skin: No evidence of breakdown, no evidence of rash   GU: + Foley.  Blood-tinged urine no clots Skin: Warm and dry.   Right AKA with dressing start here that we will take at the care of things on this in for you Sacral ulcer not  examined today. Psych: Normal mood.  Normal behavior. Musc:  Right AKA with edema and tenderness Left lower extremity edema Neuro: Alert Very HOH Motor: Bilateral upper extremities: 5/5 proximal distal Right lower extremity: Hip flexion 3+/5 (pain inhibition)AKA Left lower extremity: Hip flexion, knee extension, ankle dorsiflexion 4 -/5 Skin- redenss at right foot ankle , scaly skin no weeping, (mild stasis dermatitis )  Assessment/Plan: 1. Functional deficits which require 3+ hours per day of interdisciplinary therapy in a comprehensive inpatient rehab setting.  Physiatrist is providing close team supervision and 24 hour management of active medical problems listed below.  Physiatrist and rehab team continue to assess barriers to discharge/monitor patient progress toward functional and medical goals   Care Tool:  Bathing    Body parts bathed by patient: Right arm,Left arm,Chest,Abdomen,Front perineal area,Left upper leg,Face   Body parts bathed by helper: Buttocks,Left lower leg Body parts n/a: Right upper leg,Right lower leg   Bathing assist Assist Level: Moderate Assistance - Patient 50 - 74%     Upper Body Dressing/Undressing Upper body dressing   What is the patient wearing?: Hospital gown only    Upper body assist Assist Level: Minimal Assistance - Patient > 75%    Lower Body Dressing/Undressing Lower body dressing      What is the patient wearing?: Incontinence brief     Lower body assist Assist for lower body dressing: Maximal Assistance - Patient 25 - 49%     Toileting Toileting    Toileting assist Assist for toileting: 2 Helpers     Transfers Chair/bed transfer  Transfers assist  Chair/bed transfer activity did not occur: Refused  Chair/bed transfer assist level: 2 Helpers     Locomotion Ambulation   Ambulation assist      Assist level: 2 helpers Assistive device: Parallel bars Max distance: 51ft   Walk 10 feet activity   Assist   Walk 10 feet activity did not occur: Safety/medical concerns (fatigue, generalized weakness, poor endurance)        Walk 50 feet activity   Assist Walk 50 feet with 2 turns activity did not occur: Safety/medical concerns (fatigue, generalized weakness, poor endurance)         Walk 150 feet activity   Assist Walk 150 feet activity did not occur: Safety/medical concerns (fatigue, generalized weakness, poor endurance)         Walk 10 feet on uneven surface  activity   Assist Walk 10 feet on uneven surfaces activity did not occur: Safety/medical concerns (fatigue, generalized weakness, poor endurance)         Wheelchair     Assist Will patient use wheelchair at discharge?: Yes Type of Wheelchair: Manual    Wheelchair assist level: Supervision/Verbal cueing Max wheelchair distance: 46ft    Wheelchair 50 feet with 2 turns activity    Assist  Assist Level: Supervision/Verbal cueing   Wheelchair 150 feet activity     Assist      Assist Level: Maximal Assistance - Patient 25 - 49%    Medical Problem List and Plan: 1.  Decreased functional mobility secondary to cellulitis left foot as well as right AKA revision 01/27/2020  CIR PT, OT 2.  Antithrombotics: -DVT/anticoagulation:Lovenox  on 30mg  BID, pt with low body weight will change to daily              -antiplatelet therapy: Aspirin 81 mg daily 3. Pain Management: Tylenol prn per wife   Monitor with increased exertion. 4. Mood: Provide emotional support  Fluoxetine started on 1/28 for depression             -antipsychotic agents: N/A 5. Neuropsych: This patient is?  Fully capable of making decisions on his own behalf. 6. Skin/Wound decubitus to coccyx and R AKA.  Skin care as directed.  Routine skin checks- dressing to be changed BID and prn  Monitor wounds 7. Fluids/Electrolytes/Nutrition: Routine in and outs. 8.  ID/cellulitis left foot.  MRSA screen negative.  Blood cultures no growth  to date.  Currently completing course of Augmentin 02/05/2020 9.  Atrial flutter/A. fib with RVR.  Toprol 25 mg daily.  Not a candidate for long-term DOAC.  30-day event monitor as outpatient.  Follow-up cardiology services  Monitor with increased activity 10.  Hyponatremia.  Lasix and Aldactone currently on hold- per Cards for today with plans to resume tomorrow.    Sodium 130 on 1/28, down to 128 on 1/30, will resume NaCl tablets  Restart sodium chloride tabs as necessary 11.  Chronic diastolic congestive heart failure.    Lasix and Aldactone  Followed by cardiology services  Monitor for any signs of fluid overload Filed Weights   02/06/20 0902 02/07/20 0347 02/08/20 0513  Weight: 53.9 kg 51.5 kg 51.7 kg   12.  Chronic respiratory failure with hypoxia/COPD.  Chronic oxygen therapy 2 L.  Continue chronic prednisone. Wheezing left base , required xopenex this am in addition to symbicort and tiotropium check CXR, WBC normal this am afebrile  Repeat chest x-ray 02/08/2020 showed no acute changes 13.  Chronic urinary retention.  Chronic Foley tube followed by urology service [name].  Will need to contact urology office during office hours to see if they recommend changing Foley while in the hospital.  Need to find out whether RN can change this or whether this needs to be done by urology. Continue Myrbetriq as well as Proscar.  Change Foley catheter to routinely- last changed 01/16/2020- .may flush Hematuria, likely catheter trauma plus anticoag- Hgb relatively stable ( 1 gm down) but will monitor , reduce lovenox to daily  Change Myrbetriq to qam  14.  GERD.  Protonix 15.  Chronic sacral decubitus.  Santyl ointment twice daily.   See #6 16.  Hypothyroidism: Synthroid 17.  Normocytic anemia.  Transfused 1 unit packed red blood cells 01/31/2020.    Hemoglobin 9.5 on 1/28, 8.5 on 1/30- likely from hematuria, should improve with reduced dose of lovenox   19.  Hx constipation improved now with  cont stool   LOS: 3 days A FACE TO FACE EVALUATION WAS PERFORMED  Charlett Blake 02/08/2020, 9:44 AM

## 2020-02-08 NOTE — Progress Notes (Signed)
Physical Therapy Session Note  Patient Details  Name: [name] MRN: [id number] Date of Birth: 01-13-1929  Today's Date: 02/08/2020 PT Individual Time: 1100-1154 and 1300-1340 PT Individual Time Calculation (min): 54 min and 40 min PT Missed Time: 35 minutes due to fatigue  Short Term Goals: Week 1:  PT Short Term Goal 1 (Week 1): STG=LTG due to LOS  Skilled Therapeutic Interventions/Progress Updates:   Treatment Session 1: 1100-1154 54 min Received pt supine in bed asleep, pt easily woken and agreeable to therapy, and reported pain 5/10 in R residual limb but declined pain interventions. Pt reported "not doing well" and reported fatigue from not sleeping well last night. Session with emphasis on dressing, functional mobility/transfers, generalized strengthening, dynamic sitting balance/coordination, and improved activity tolerance. Pt on 2L O2 via [place] with O2 sat >98% throughout session, although pt reports SOB. Donned underwear and pants in supine with max A (total A to thread catheter through pants hole) and transferred supine<>sitting EOB with CGA and doffed vest and dirty gown with min A and donned clean pull over shirt and vest with mod A. Noted pt bloated and unable to button pants this morning. Pt also with blood in urine and increased bruising along RUE. RN made aware and present during session to assess pt and educate pt's wife on hematuria and encouraged pt to drink more water. Pt transferred bed<>WC via lateral scoot with CGA however pt initially reported "I don't think I can do this" and required maximal encouragement. Pt requested to take inhaler and able to do so with set up assist provided by pt's wife. Pt performed WC mobility 35ft x 1, 17ft x 1, and 30ft x 1 using BUE and supervision with 2 rest breaks prior to reporting fatigue and requesting to be transported remainder of way. Pt required significantly increased time with WC mobility due to fatigue, poor endurance, and short stride  length. Pt performed the following exercises sitting in WC with supervision and verbal cues for technique.  -L hip flexion 2x12 -L LAQ 2x12 Pt transported back to room in [facility] total A. Concluded session with pt sitting in WC, needs within reach, and seatbelt alarm on. [name] present at bedside.   Treatment Session 2: 7628-3151 40 min Received pt sitting in WC, pt agreeable to therapy, and reported pain 6/10 in shoulders and in R residual limb. Pt initially declined pain interventions however therapist educated pt on importance of staying on top of pain to be able to participate in therapy. Pt agreeable to taking tylenol and RN notified and present to adminster medication during session. Pt required increased time to take medication as pt with multiple questions regarding ingredients of each pill. Session with emphasis on functional mobility/transfers, toileting, generalized strengthening, dynamic standing balance/coordination, and improved activity tolerance. Pt reported urge to have BM and is very particular with set up for toileting, ultimately requesting therapist bring bedside commode out and refusing to practice transfer over toilet. Pt transferred WC<>bed via lateral scoot with CGA and required min/mod A of 2 (provided by pt's wife) to doff underwear/pants. Pt able to boost up using UEs to assist. Pt transferred bed<>bedside commode via lateral scoot and with medium sized BM. Pt reported being unable to wipe himself due to "feeling weak". Therapist encouraged pt to stand for peri-care for improved strengthening. Pt initially hesitant but agreeable with convincing. Pt transferred sit<>stand with RW and min A and therapist provided CGA while pt's wife provided total A for peri-care. Pt then  requested to return to bed and transferred bedside commode<>bed via lateral scoot and scooted to [facility] with supervision via lateral scoots. Sit<>semi-reclined with min A for LLE management due to fatigue. Concluded  session with pt semi-reclined in bed, needs within reach, and bed alarm on. Wife present at bedside and RN present changing pt's soiled sacral dressing. 35 minutes missed of skilled physical therapy due to fatigue.   Therapy Documentation Precautions:  Precautions Precautions: Fall Precaution Comments: H/o R AKA (s/p revision 1/18) Restrictions Weight Bearing Restrictions: Yes RLE Weight Bearing: Non weight bearing  Therapy/Group: Individual Therapy Alfonse Alpers PT, DPT   02/08/2020, 7:22 AM

## 2020-02-08 NOTE — Progress Notes (Signed)
Patient and spouse refusing CHG wipe baths because they report a reaction that happened after a CHG bath earlier in their stay. Patient offered CHG bath with foley care this evening. Patient refused and educated

## 2020-02-08 NOTE — Progress Notes (Signed)
Occupational Therapy Session Note  Patient Details  Name: [name] MRN: [id number] Date of Birth: 08-16-1929  Today's Date: 02/08/2020 OT Individual Time: 0930-1000 OT Individual Time Calculation (min): 30 min  and Today's Date: 02/08/2020 OT Missed Time: 17 Minutes Missed Time Reason: Patient fatigue   Short Term Goals: Week 1:  OT Short Term Goal 1 (Week 1): Pt will complete toilet transfers with mod assist OT Short Term Goal 2 (Week 1): Pt will complete LB dressing mod assist at sit > stand level OT Short Term Goal 3 (Week 1): Pt will complete toileting tasks with mod assist  Skilled Therapeutic Interventions/Progress Updates:    Pt sitting EOB upon arrival. Pt stated he didn't think he was going to have any therapy today. I discussed his schedule for today. Pt stated he didn't rest during the night 2/2 LLE pain. Pt also stated he had a loose BM earlier in the morning and staff had to "clean him up." He stated that that alone was exhausting. Pt declined washing up or donning clothing this morning secondary to exhaustion/fatigue. Pt apologetic but states he is just worn out. Pt remained in bed with bed alarm activated and RN present to administer medications.   Therapy Documentation Precautions:  Precautions Precautions: Fall Precaution Comments: H/o R AKA (s/p revision 1/18) Restrictions Weight Bearing Restrictions: Yes RLE Weight Bearing: Non weight bearing General: General OT Amount of Missed Time: 45 Minutes Pain:  Pt c/o LLE pain (unrated); pt stated pain bothered through the night and was unable to rest. Pt also stated he usually experienced pain in RLE at surgical site.   Therapy/Group: Individual Therapy  [name] 02/08/2020, 10:11 AM

## 2020-02-09 ENCOUNTER — Other Ambulatory Visit: Payer: Self-pay

## 2020-02-09 DIAGNOSIS — R339 Retention of urine, unspecified: Secondary | ICD-10-CM

## 2020-02-09 LAB — CBC
HCT: 27.1 % — ABNORMAL LOW (ref 39.0–52.0)
Hemoglobin: 8.9 g/dL — ABNORMAL LOW (ref 13.0–17.0)
MCH: 31.2 pg (ref 26.0–34.0)
MCHC: 32.8 g/dL (ref 30.0–36.0)
MCV: 95.1 fL (ref 80.0–100.0)
Platelets: 290 10*3/uL (ref 150–400)
RBC: 2.85 MIL/uL — ABNORMAL LOW (ref 4.22–5.81)
RDW: 13.7 % (ref 11.5–15.5)
WBC: 7.2 10*3/uL (ref 4.0–10.5)
nRBC: 0 % (ref 0.0–0.2)

## 2020-02-09 MED ORDER — SODIUM CHLORIDE 1 G PO TABS
1.0000 g | ORAL_TABLET | Freq: Two times a day (BID) | ORAL | Status: DC
  Administered 2020-02-09 – 2020-02-16 (×14): 1 g via ORAL
  Filled 2020-02-09 (×15): qty 1

## 2020-02-09 NOTE — Progress Notes (Signed)
Physical Therapy Session Note  Patient Details  Name: [name] MRN: [id number] Date of Birth: 18-Aug-1929  Today's Date: 02/09/2020 PT Individual Time: 1302-1330 PT Individual Time Calculation (min): 28 min   Skilled Therapeutic Interventions/Progress Updates:     Pt received seated in [facility] and agrees to therapy, though reports significant fatigue and unsure how much he can tolerate. WC transport to gym for time management. Pt performs 1x10 LAQs and 1x10 seated marches with L leg. Pt requires extended seated rest breaks between activities due to SOB. PT cues for deep, pursed lip breathing. Pt performs 3x4 seated press ups using bilateral upper extremities and L lower extremity, with PT providing minA at trunk. WC transport back to room. Pt left seated with alarm intact and all needs within reach.  Therapy Documentation Precautions:  Precautions Precautions: Fall Precaution Comments: H/o R AKA (s/p revision 1/18) Restrictions Weight Bearing Restrictions: Yes RLE Weight Bearing: Non weight bearing    Therapy/Group: Individual Therapy  Breck Coons, PT, DPT 02/09/2020, 4:00 PM

## 2020-02-09 NOTE — Progress Notes (Signed)
Initial Nutrition Assessment  RD working remotely.  DOCUMENTATION CODES:   Underweight  INTERVENTION:   - Add Magic Cup TID with meals, each supplement provides 290 kcal and 9 grams of protein  - Continue ProSource Plus 30 ml po BID, each supplement provides 100 kcal and 15 grams of protein  - Continue MVI with minerals daily  NUTRITION DIAGNOSIS:   Increased nutrient needs related to wound healing,chronic illness as evidenced by estimated needs.  GOAL:   Patient will meet greater than or equal to 90% of their needs  MONITOR:   PO intake,Supplement acceptance,Labs,Weight trends,Skin,I & O's  REASON FOR ASSESSMENT:   Other (low BMI)    ASSESSMENT:   87 year old male with PMH of chronic urinary retention with chronic indwelling Foley catheter tube, HTN, HLD, CAD s/p CABG 2003, COPD, PVD, CHF, sacral decubitus ulcer, right AKA 08/11/19, history of chronic wounds to lower extremities. Pt received CIR services 08/21/19 to 09/01/19 and was discharged to SNF. Presented 01/24/20 with LLE cellulitis. Pt was noted to have open wound of the right AKA stump with exposed bone. Pt underwent revision of right AKA 01/27/20. Admitted to CIR on 02/05/20.   Unable to reach pt/family via phone call to room. Reviewed RD notes from acute admission. Pt with a history of moderate malnutrition in context of chronic illness. Pt does not like Ensure or Boost Breeze supplements. RD will order Magic Cups with meals and continue ProSource Plus to aid pt in meeting kcal and protein needs.  Reviewed weight history in chart. Pt with a weight loss of 7.4 kg since 11/20/19. This is a 12.2% weight loss in less than 3 months which is severe and significant for timeframe. Suspect malnutrition persists but unable to confirm without NFPE.  Meal Completion: 20-80%  Medications reviewed and include: ProSource Plus BID, cholecalciferol, lasix, MVI with minerals, protonix, prednisone, florastor, NaCl 1 gram BID,  spironolactone, vitamin B-12  Labs reviewed: sodium 128, hemoglobin 8.9   UOP: 1620 ml x 24 hours  NUTRITION - FOCUSED PHYSICAL EXAM:  Unable to complete at this time. RD working remotely.  Diet Order:   Diet Order            Diet Heart Room service appropriate? Yes; Fluid consistency: Thin; Fluid restriction: 1500 mL Fluid  Diet effective now                 EDUCATION NEEDS:   Not appropriate for education at this time  Skin:  Skin Assessment: Skin Integrity Issues: Stage II: coccyx Other: stump wound s/p right AKA and revision  Last BM:  02/08/20 medium type 5  Height:   Ht Readings from Last 1 Encounters:  02/06/20 5\' 8"  (1.727 m)    Weight:   Wt Readings from Last 1 Encounters:  02/09/20 53.4 kg    BMI:  Body mass index is 17.9 kg/m.  Estimated Nutritional Needs:   Kcal:  1800-2000  Protein:  100-115 grams  Fluid:  1.8 L/day    Gustavus Bryant, MS, RD, LDN Inpatient Clinical Dietitian Please see [facility] for contact information.

## 2020-02-09 NOTE — Progress Notes (Signed)
[facility] PHYSICAL MEDICINE & REHABILITATION PROGRESS NOTE  Subjective/Complaints: Patient seen sitting up in bed this morning.  He states he did not sleep well overnight due to bladder spasms he also notes left toe pain.  He states he is tired this morning but does not know why.  ROS: + Baseline shortness of breath, unchanged.  Denies CP, N/V/D  Objective: Vital Signs: Blood pressure 136/72, pulse 82, temperature 97.8 F (36.6 C), temperature source Oral, resp. rate 18, height 5\' 8"  (1.727 m), weight 53.4 kg, SpO2 98 %. DG CHEST PORT 1 VIEW  Result Date: 02/08/2020 CLINICAL DATA:  New onset atrial flutter to atrial fibrillation. EXAM: PORTABLE CHEST 1 VIEW COMPARISON:  Chest radiograph dated 01/30/2020 FINDINGS: The heart size is normal. Vascular calcifications are seen in the aortic arch. The lungs are hyperinflated. Scarring in the left costophrenic angle is redemonstrated. Otherwise, both lungs are clear. The bones are osteopenic and degenerative changes are seen in the left shoulder. Median sternotomy wires are noted. IMPRESSION: 1. No acute cardiopulmonary findings. 2. Hyperinflation suggesting chronic obstructive pulmonary disease. Electronically Signed   By: Zerita Boers M.D.   On: 02/08/2020 12:44   Recent Labs    02/08/20 0502 02/09/20 0607  WBC 7.3 7.2  HGB 8.5* 8.9*  HCT 26.1* 27.1*  PLT 263 290   Recent Labs    02/07/20 0619  NA 128*  K 3.7  CL 93*  CO2 29  GLUCOSE 82  BUN 14  CREATININE 0.80  CALCIUM 8.6*    Intake/Output Summary (Last 24 hours) at 02/09/2020 1205 Last data filed at 02/09/2020 1000 Gross per 24 hour  Intake 620 ml  Output 2240 ml  Net -1620 ml     Pressure Injury 06/27/19 Coccyx Posterior;Mid Stage 1 -  Intact skin with non-blanchable redness of a localized area usually over a bony prominence. (Active)  06/27/19 2300  Location: Coccyx  Location Orientation: Posterior;Mid  Staging: Stage 1 -  Intact skin with non-blanchable redness of a  localized area usually over a bony prominence.  Wound Description (Comments):   Present on Admission: Yes     Pressure Injury 08/20/19 Coccyx Mid Stage 2 -  Partial thickness loss of dermis presenting as a shallow open injury with a red, pink wound bed without slough. (Active)  08/20/19 2200  Location: Coccyx  Location Orientation: Mid  Staging: Stage 2 -  Partial thickness loss of dermis presenting as a shallow open injury with a red, pink wound bed without slough.  Wound Description (Comments):   Present on Admission: Yes     Pressure Injury 08/27/19 Buttocks Medial (Active)  08/27/19 1030  Location: Buttocks  Location Orientation: Medial  Staging:   Wound Description (Comments):   Present on Admission:      Pressure Injury 01/24/20 Coccyx Mid;Lower Stage 2 -  Partial thickness loss of dermis presenting as a shallow open injury with a red, pink wound bed without slough. (Active)  01/24/20 1445  Location: Coccyx  Location Orientation: Mid;Lower  Staging: Stage 2 -  Partial thickness loss of dermis presenting as a shallow open injury with a red, pink wound bed without slough.  Wound Description (Comments):   Present on Admission: Yes    Physical Exam: BP 136/72 (BP Location: Right Arm)   Pulse 82   Temp 97.8 F (36.6 C) (Oral)   Resp 18   Ht 5\' 8"  (1.727 m)   Wt 53.4 kg   SpO2 98%   BMI 17.90 kg/m  Constitutional:  No distress . Vital signs reviewed. HENT: Normocephalic.  Atraumatic. Eyes: EOMI. No discharge. Cardiovascular: No JVD.  RRR. Respiratory: Normal effort.  No stridor.  Bilateral clear to auscultation. GI: Non-distended.  BS +. GU: + Foley. Skin: Warm and dry.  Intact. Psych: Normal mood.  Normal behavior. Musc:  Right AKA with edema and tenderness Left lower extremity edema Neuro: Alert Very HOH Motor: Bilateral upper extremities: 5/5 proximal distal Right lower extremity: Hip flexion 3+/5 (pain inhibition), improving Left lower extremity: Hip  flexion, knee extension, ankle dorsiflexion 4 -/5  Assessment/Plan: 1. Functional deficits which require 3+ hours per day of interdisciplinary therapy in a comprehensive inpatient rehab setting.  Physiatrist is providing close team supervision and 24 hour management of active medical problems listed below.  Physiatrist and rehab team continue to assess barriers to discharge/monitor patient progress toward functional and medical goals   Care Tool:  Bathing    Body parts bathed by patient: Right arm,Left arm,Chest,Abdomen,Front perineal area,Left upper leg,Face   Body parts bathed by helper: Buttocks,Left lower leg Body parts n/a: Right upper leg,Right lower leg   Bathing assist Assist Level: Moderate Assistance - Patient 50 - 74%     Upper Body Dressing/Undressing Upper body dressing   What is the patient wearing?: Pull over shirt    Upper body assist Assist Level: Supervision/Verbal cueing    Lower Body Dressing/Undressing Lower body dressing      What is the patient wearing?: Pants     Lower body assist Assist for lower body dressing: Minimal Assistance - Patient > 75%     Toileting Toileting    Toileting assist Assist for toileting: 2 Helpers     Transfers Chair/bed transfer  Transfers assist  Chair/bed transfer activity did not occur: Refused  Chair/bed transfer assist level: Contact Guard/Touching assist     Locomotion Ambulation   Ambulation assist      Assist level: 2 helpers Assistive device: Parallel bars Max distance: 46ft   Walk 10 feet activity   Assist  Walk 10 feet activity did not occur: Safety/medical concerns (fatigue, generalized weakness, poor endurance)        Walk 50 feet activity   Assist Walk 50 feet with 2 turns activity did not occur: Safety/medical concerns (fatigue, generalized weakness, poor endurance)         Walk 150 feet activity   Assist Walk 150 feet activity did not occur: Safety/medical concerns  (fatigue, generalized weakness, poor endurance)         Walk 10 feet on uneven surface  activity   Assist Walk 10 feet on uneven surfaces activity did not occur: Safety/medical concerns (fatigue, generalized weakness, poor endurance)         Wheelchair     Assist Will patient use wheelchair at discharge?: Yes Type of Wheelchair: Manual    Wheelchair assist level: Supervision/Verbal cueing Max wheelchair distance: 52ft    Wheelchair 50 feet with 2 turns activity    Assist        Assist Level: Supervision/Verbal cueing   Wheelchair 150 feet activity     Assist      Assist Level: Maximal Assistance - Patient 25 - 49%    Medical Problem List and Plan: 1.  Decreased functional mobility secondary to cellulitis left foot as well as right AKA revision 01/27/2020  Continue CIR 2.  Antithrombotics: -DVT/anticoagulation:Lovenox  on 30mg  BID, pt with low body weight will change to daily              -  antiplatelet therapy: Aspirin 81 mg daily 3. Pain Management: Tylenol prn per wife   Controlled on 1/31  Monitor with increased exertion. 4. Mood: Provide emotional support  Fluoxetine started on 1/28 for depression             -antipsychotic agents: N/A 5. Neuropsych: This patient is?  Fully capable of making decisions on his own behalf. 6. Skin/Wound decubitus to coccyx and R AKA.  Skin care as directed.  Routine skin checks- dressing to be changed BID and prn  Monitor wounds 7. Fluids/Electrolytes/Nutrition: Routine in and outs. 8.  ID/cellulitis left foot.  MRSA screen negative.  Blood cultures no growth to date.    Completed Augmentin on 02/05/2020 9.  Atrial flutter/A. fib with RVR.  Toprol 25 mg daily.  Not a candidate for long-term DOAC.  30-day event monitor as outpatient.  Follow-up cardiology services  Controlled on 1/31, appreciate cards recs   Monitor with increased activity 10.  Hyponatremia.  Lasix and Aldactone resumed at lower doses.    Sodium  128 on 1/30, labs ordered for tomorrow  Sodium chloride tabs resumed on 1/31 11.  Chronic diastolic congestive heart failure.    Lasix and Aldactone  Followed by cardiology services  Monitor for any signs of fluid overload Filed Weights   02/07/20 0347 02/08/20 0513 02/09/20 0554  Weight: 51.5 kg 51.7 kg 53.4 kg   12.  Chronic respiratory failure with hypoxia/COPD.  Chronic oxygen therapy 2 L.  Continue chronic prednisone. 13.  Chronic urinary retention.  Chronic Foley tube followed by urology service [name].    Continue Myrbetriq as well as Proscar.    Change Foley catheter to routinely- last changed 01/16/2020  Will follow up with urology regarding Foley change as well as bladder spasms 14.  GERD.  Protonix 15.  Chronic sacral decubitus.  Santyl ointment twice daily.   See #6 16.  Hypothyroidism: Synthroid 17.  Normocytic anemia.  Transfused 1 unit packed red blood cells 01/31/2020.    Hemoglobin 8.9 on 1/31 19.  Hx constipation improved now with cont stool   LOS: 4 days A FACE TO FACE EVALUATION WAS PERFORMED  Emmie Frakes Lorie Phenix 02/09/2020, 12:05 PM

## 2020-02-09 NOTE — Progress Notes (Signed)
Physical Therapy Session Note  Patient Details  Name: [name] MRN: [id number] Date of Birth: Nov 12, 1929  Today's Date: 02/09/2020 PT Individual Time: 8588-5027 and 1445-1517 PT Individual Time Calculation (min): 24 min and 32 min PT Missed Time: 13 minutes  Short Term Goals: Week 1:  PT Short Term Goal 1 (Week 1): STG=LTG due to LOS  Skilled Therapeutic Interventions/Progress Updates:   Treatment Session 1: 1130-1154 24 min Received pt sitting in bed with wife present at bedside, pt agreeable to therapy, and reported pain 5/10 in R shoulder/arm. Pt declined any pain medications. Repositioning, rest breaks, and distraction done to reduce pain levels. Pt reported not sleeping well again last night but was agreeable to get OOB into WC. Session with emphasis on functional mobility/transfers, generalized strengthening, dynamic standing balance/coordination, and improved activity tolerance. Pt on 2L O2 via [place] with O2 sat >95% throughout session. Pt requested to use nasal spray prior to getting OOB. Pt transferred supine<>sitting EOB with HOB elevated and supervision and transferred bed<>WC via lateral scoot with CGA. Pt reported his pants were bunched up and requested therapist assist in pulling them down. Therapist encouraged pt to stand to do so. Pt stated "if I stand I'm going to fall and break my hip." Therapist reassured pt that he is able to stand with as little as min A and encouraged pt to attempt. Donned L Darco shoe with total A per pt request and pt transferred sit<>stand with RW and min A and required total A to pull pants down. Pt able to stand ~45 seconds with no LOB noted prior to sitting. Doffed L Darco shoe. Concluded session with pt sitting in WC, needs within reach, and seatbelt alarm on. Wife present at bedside.   Treatment Session 2: 1445-1517 32 min Received pt sidelying in bed with RN present changing sacral dressing, pt agreeable to therapy, and did not state pain level during  session. Session with emphasis on bed mobility, generalized strengthening, and improved activity tolerance. Pt on 2L O2 via [place] with O2 sat >96%. Pt reported feeling "horrible" but did not elaborate as to why. Doffed pants and donned gown (per wife request) with mod A and total A for catheter management. Pt performed the following exercises supine in bed with supervision and verbal cues for technique: -L heel slides x10 -L hip abduction x10 -L ankle circles clockwise/counterclockwise x15 bilaterally Noted pt with significant LLE edema; NT notified to inform RN. Pt stated he "has no strength and no stamina" and reported 8/10 fatigue. Noted pt with heavy eyelids and reported feeling like he "can't focus." Pt requested to rest. Concluded session with pt semi-reclined in bed, needs within reach, and bed alarm on. 13 minutes missed of skilled physical therapy due to fatigue.   Therapy Documentation Precautions:  Precautions Precautions: Fall Precaution Comments: H/o R AKA (s/p revision 1/18) Restrictions Weight Bearing Restrictions: Yes RLE Weight Bearing: Non weight bearing  Therapy/Group: Individual Therapy Alfonse Alpers PT, DPT   02/09/2020, 7:32 AM

## 2020-02-09 NOTE — Progress Notes (Signed)
Progress Note  Patient Name: [name] Date of Encounter: 02/09/2020  [facility] Cardiologist: Sinclair Grooms, MD   Subjective   Feeling well.  Complains of insomnia, chronic sinus congestion, and orthopedic concerns.  No active cardiac issues.  Inpatient Medications    Scheduled Meds: . (feeding supplement) PROSource Plus  30 mL Oral BID BM  . aspirin EC  81 mg Oral Daily  . atorvastatin  40 mg Oral QHS  . budesonide-formoterol  2 puff Inhalation BID  . Chlorhexidine Gluconate Cloth  6 each Topical Daily  . cholecalciferol  2,000 Units Oral Q lunch  . enoxaparin (LOVENOX) injection  40 mg Subcutaneous Q24H  . finasteride  5 mg Oral Q lunch  . FLUoxetine  10 mg Oral Daily  . fluticasone  2 spray Each Nare Daily  . furosemide  20 mg Oral Daily  . guaiFENesin  600 mg Oral BID  . levothyroxine  50 mcg Oral Daily  . metoprolol succinate  25 mg Oral Daily  . mirabegron ER  25 mg Oral Daily  . multivitamin with minerals  1 tablet Oral Daily  . pantoprazole  40 mg Oral Daily  . predniSONE  10 mg Oral Daily  . saccharomyces boulardii  250 mg Oral BID  . spironolactone  12.5 mg Oral Daily  . Tiotropium Bromide Monohydrate  2 puff Inhalation Daily  . vitamin B-12  1,000 mcg Oral Daily   Continuous Infusions:  PRN Meds: acetaminophen, albuterol, ALPRAZolam, bisacodyl, calcium carbonate, HYDROcodone-acetaminophen, nitroGLYCERIN, polyethylene glycol, polyvinyl alcohol, senna-docusate   Vital Signs    Vitals:   02/08/20 0841 02/08/20 1430 02/08/20 2030 02/09/20 0554  BP:  (!) 101/53 112/62 136/72  Pulse:  77 81 82  Resp:  17 18 18   Temp:  98 F (36.7 C) 98.2 F (36.8 C) 97.8 F (36.6 C)  TempSrc:  Oral Oral Oral  SpO2: 98% 100% 99% 98%  Weight:    53.4 kg  Height:        Intake/Output Summary (Last 24 hours) at 02/09/2020 0917 Last data filed at 02/09/2020 0900 Gross per 24 hour  Intake 620 ml  Output 1715 ml  Net -1095 ml   Last 3 Weights 02/09/2020  02/08/2020 02/07/2020  Weight (lbs) 117 lb 11.6 oz 113 lb 15.7 oz 113 lb 8.6 oz  Weight (kg) 53.4 kg 51.7 kg 51.5 kg      Telemetry    n/a - Personally Reviewed  ECG    n/a - Personally Reviewed  Physical Exam   VS:  BP 136/72 (BP Location: Right Arm)   Pulse 82   Temp 97.8 F (36.6 C) (Oral)   Resp 18   Ht 5\' 8"  (1.727 m)   Wt 53.4 kg   SpO2 98%   BMI 17.90 kg/m  , BMI Body mass index is 17.9 kg/m. GENERAL:  Frail.  Chronically ill-appearing HEENT: Pupils equal round and reactive, fundi not visualized, oral mucosa unremarkable NECK:  No jugular venous distention, waveform within normal limits, carotid upstroke brisk and symmetric, no bruits LUNGS:  Mild rhonchi HEART:  RRR.  PMI not displaced or sustained,S1 and S2 within normal limits, no S3, no S4, no clicks, no rubs, no murmurs ABD:  Flat, positive bowel sounds normal in frequency in pitch, no bruits, no rebound, no guarding, no midline pulsatile mass, no hepatomegaly, no splenomegaly EXT:  2 plus pulses throughout, 2+ L LE edema, no cyanosis no clubbing SKIN:  No rashes no nodules NEURO:  Cranial nerves II through XII grossly intact, motor grossly intact throughout [facility]:  Cognitively intact, oriented to person place and time   Labs    High Sensitivity Troponin:   Recent Labs  Lab 01/26/20 1406 01/26/20 1536  TROPONINIHS 107* 115*      Chemistry Recent Labs  Lab 02/05/20 0054 02/06/20 0805 02/07/20 0619  NA 127* 130* 128*  K 4.4 4.2 3.7  CL 90* 91* 93*  CO2 27 30 29   GLUCOSE 99 120* 82  BUN 15 16 14   CREATININE 0.93 0.94 0.80  CALCIUM 8.6* 9.2 8.6*  PROT  --  5.2*  --   ALBUMIN  --  3.1*  --   AST  --  28  --   ALT  --  28  --   ALKPHOS  --  55  --   BILITOT  --  1.1  --   GFRNONAA >60 >60 >60  ANIONGAP 10 9 6      Hematology Recent Labs  Lab 02/06/20 0805 02/08/20 0502 02/09/20 0607  WBC 7.7 7.3 7.2  RBC 2.90* 2.69* 2.85*  HGB 9.5* 8.5* 8.9*  HCT 28.0* 26.1* 27.1*  MCV 96.6 97.0  95.1  MCH 32.8 31.6 31.2  MCHC 33.9 32.6 32.8  RDW 13.6 13.9 13.7  PLT 292 263 290    BNPNo results for input(s): BNP, PROBNP in the last 168 hours.   DDimer No results for input(s): DDIMER in the last 168 hours.   Radiology    DG CHEST PORT 1 VIEW  Result Date: 02/08/2020 CLINICAL DATA:  New onset atrial flutter to atrial fibrillation. EXAM: PORTABLE CHEST 1 VIEW COMPARISON:  Chest radiograph dated 01/30/2020 FINDINGS: The heart size is normal. Vascular calcifications are seen in the aortic arch. The lungs are hyperinflated. Scarring in the left costophrenic angle is redemonstrated. Otherwise, both lungs are clear. The bones are osteopenic and degenerative changes are seen in the left shoulder. Median sternotomy wires are noted. IMPRESSION: 1. No acute cardiopulmonary findings. 2. Hyperinflation suggesting chronic obstructive pulmonary disease. Electronically Signed   By: Zerita Boers M.D.   On: 02/08/2020 12:44    Cardiac Studies   ECHO 01/28/2020  1. Left ventricular ejection fraction, by estimation, is 60 to 65%. The  left ventricle has normal function. The left ventricle has no regional  wall motion abnormalities. There is mild concentric left ventricular  hypertrophy. Left ventricular diastolic  parameters are indeterminate.  2. Right ventricular systolic function is normal. The right ventricular  size is normal.  3. The mitral valve is normal in structure. No evidence of mitral valve  regurgitation. No evidence of mitral stenosis.  4. The aortic valve is tricuspid. There is mild calcification of the  aortic valve. There is mild thickening of the aortic valve. Aortic valve  regurgitation is not visualized. Mild aortic valve sclerosis is present,  with no evidence of aortic valve  stenosis.  5. The inferior vena cava is normal in size with greater than 50%  respiratory variability, suggesting right atrial pressure of 3 mmHg.   NM Myoview 2019: IMPRESSION: 1.  Large infarct involving the inferior wall including the inferior apex. Difficult to exclude peri-infarct ischemia at the apex and recommend correlation with ECG testing.  2. Abnormal wall motion along the inferior wall base and septal wall as described.  3. Left ventricular ejection fraction is 48%.  4. Non invasive risk stratification*: Intermediate  Patient Profile     87 y.o. male with CAD s/p CABG in  123456, chronic diastolic heart failure, advanced PAD, COPD on home O2, hypertension admitted for right AKA 01/27/2020.  He developed postoperative atrial fibrillation/flutter on 1/24, which was a new finding.  Assessment & Plan    # Atrial fibrillation/flutter: He had an 8 hour episode of atrial flutter alternating with atrial fibrillation.  He converted to sinus rhythm spontaneously.  His beta blocker was increased.  Plan for 30 day event monitor on discharge from CIR. no plan for long term anticoagulation any unless he has recurrence.  #CAD: CABG in 2003.  He had a Myoview in 2019 with inferior infarct with peri-infarct ischemia.  Continue aspirin and atorvastatin.  #PAD: Status post right AKA this admission.  He was offered left foot amputation but declined.  Continue aspirin and atorvastatin.  Continue metoprolol.  #Acute on chronic diastolic heart failure: Developed edema after stopping diuretics.  Furosemide and spironolactone were resumed at lower doses.  He was net -1.1 L yesterday.  We will repeat basic metabolic panel given his hyponatremia.      For questions or updates, please contact [place] Please consult [email or web address] for contact info under        Signed, Skeet Latch, MD  02/09/2020, 9:17 AM

## 2020-02-09 NOTE — Progress Notes (Signed)
Occupational Therapy Session Note  Patient Details  Name: [name] MRN: [id number] Date of Birth: Jan 18, 1929  Today's Date: 02/09/2020 OT Individual Time: 5056-9794 OT Individual Time Calculation (min): 63 min  and Today's Date: 02/09/2020 OT Missed Time: 12 Minutes Missed Time Reason: Patient fatigue   Short Term Goals: Week 1:  OT Short Term Goal 1 (Week 1): Pt will complete toilet transfers with mod assist OT Short Term Goal 2 (Week 1): Pt will complete LB dressing mod assist at sit > stand level OT Short Term Goal 3 (Week 1): Pt will complete toileting tasks with mod assist  Skilled Therapeutic Interventions/Progress Updates:    Treatment session with focus on self-care retraining and activity tolerance.  Pt received supine in bed asleep, requiring increased time to arouse.  Cardiology MD arrived and pt fully aroused for her.  Pt reports fatigue and insomnia, with difficulty maintaining arousal.  Pt agreeable to completing dressing at bed level, but declines bathing as he reports already completing it this AM (?CHG bath).  Pt did wash face and hands with warm cloth with minimal improvement in arousal.  Pt required increased time to don shirt.  Pt then came to sitting EOB to don pants.  Pt required assistance to thread LLE due to foley catheter bag but then pt able to pull pants fully over hips with lateral leans and scooting back in bed.  Pt returned to EOB with CGA.  Pt reports fatigue with conflicting responses in regards to continued therapy vs returning to supine.  Pt with one instance of beginning to fall backwards/fall asleep requiring steadying assist and then pt opening eyes wide.  Pt requested to return to supine, completed with CGA.  Pt remained semi-reclined in bed with all needs in reach.  Therapy Documentation Precautions:  Precautions Precautions: Fall Precaution Comments: H/o R AKA (s/p revision 1/18) Restrictions Weight Bearing Restrictions: Yes RLE Weight Bearing:  Non weight bearing General: General OT Amount of Missed Time: 12 Minutes Pain: Pain Assessment Pain Scale: 0-10 Pain Score: 4  Pain Type: Acute pain Pain Location: Sacrum Patients Stated Pain Goal: 2 Pain Intervention(s): Repositioned   Therapy/Group: Individual Therapy  [name] 02/09/2020, 11:31 AM

## 2020-02-10 DIAGNOSIS — F411 Generalized anxiety disorder: Secondary | ICD-10-CM

## 2020-02-10 LAB — BASIC METABOLIC PANEL
Anion gap: 5 (ref 5–15)
BUN: 13 mg/dL (ref 8–23)
CO2: 30 mmol/L (ref 22–32)
Calcium: 8.8 mg/dL — ABNORMAL LOW (ref 8.9–10.3)
Chloride: 89 mmol/L — ABNORMAL LOW (ref 98–111)
Creatinine, Ser: 0.76 mg/dL (ref 0.61–1.24)
GFR, Estimated: >60 mL/min (ref >60)
Glucose, Bld: 89 mg/dL (ref 70–99)
Potassium: 4 mmol/L (ref 3.5–5.1)
Sodium: 124 mmol/L — ABNORMAL LOW (ref 135–145)

## 2020-02-10 LAB — BRAIN NATRIURETIC PEPTIDE: B Natriuretic Peptide: 334.1 pg/mL — ABNORMAL HIGH (ref 0.0–100.0)

## 2020-02-10 MED ORDER — FUROSEMIDE 10 MG/ML IJ SOLN
40.0000 mg | Freq: Once | INTRAMUSCULAR | Status: AC
  Administered 2020-02-10: 40 mg via INTRAVENOUS
  Filled 2020-02-10: qty 4

## 2020-02-10 NOTE — Progress Notes (Signed)
Travis Sensing NP was notified that patient has had leaking around his bladder. Bladder scanned 611 ml was orderd to irrigate foley with 33ml. Will continue to monitor. Arthor Captain LPN

## 2020-02-10 NOTE — Progress Notes (Signed)
Received a call from [place], reporting [name] had leaking around foley, he was bladder scanned for 611 ml. [name] has chronic urinary retention with chronic indwelling foley catheter followed by Urology. Order was given to irrigate foley, he has a order not to remove his foley. Placed a call to Urology, awaiting on  a return call.  He had the above  situation on 02/07/2020 as well. We will await on Urology return call, will informed Dr Posey Pronto , regarding the above.

## 2020-02-10 NOTE — Progress Notes (Signed)
[place] PHYSICAL MEDICINE & REHABILITATION PROGRESS NOTE  Subjective/Complaints: Patient seen sitting up in bed this morning.  He states he did not sleep well overnight due to bladder spasms as well as leakage.  Wife with questions regarding labs, edema, Lasix.  Discussed with nursing as well as on-call NP.  Patient and wife state that he is doing well with therapies.  He was seen by cardiology yesterday notes reviewed-labs ordered.  ROS: + Baseline shortness of breath, stable.  Denies N/V/D  Objective: Vital Signs: Blood pressure (!) 148/83, pulse 90, temperature (!) 97.2 F (36.2 C), resp. rate 18, height 5\' 8"  (1.727 m), weight 53.4 kg, SpO2 97 %. DG CHEST PORT 1 VIEW  Result Date: 02/08/2020 CLINICAL DATA:  New onset atrial flutter to atrial fibrillation. EXAM: PORTABLE CHEST 1 VIEW COMPARISON:  Chest radiograph dated 01/30/2020 FINDINGS: The heart size is normal. Vascular calcifications are seen in the aortic arch. The lungs are hyperinflated. Scarring in the left costophrenic angle is redemonstrated. Otherwise, both lungs are clear. The bones are osteopenic and degenerative changes are seen in the left shoulder. Median sternotomy wires are noted. IMPRESSION: 1. No acute cardiopulmonary findings. 2. Hyperinflation suggesting chronic obstructive pulmonary disease. Electronically Signed   By: Zerita Boers M.D.   On: 02/08/2020 12:44   Recent Labs    02/08/20 0502 02/09/20 0607  WBC 7.3 7.2  HGB 8.5* 8.9*  HCT 26.1* 27.1*  PLT 263 290   Recent Labs    02/10/20 0646  NA 124*  K 4.0  CL 89*  CO2 30  GLUCOSE 89  BUN 13  CREATININE 0.76  CALCIUM 8.8*    Intake/Output Summary (Last 24 hours) at 02/10/2020 9485 Last data filed at 02/10/2020 4627 Gross per 24 hour  Intake 320 ml  Output 1725 ml  Net -1405 ml     Pressure Injury 01/24/20 Coccyx Mid;Lower Stage 2 -  Partial thickness loss of dermis presenting as a shallow open injury with a red, pink wound bed without slough.  (Active)  01/24/20 1445  Location: Coccyx  Location Orientation: Mid;Lower  Staging: Stage 2 -  Partial thickness loss of dermis presenting as a shallow open injury with a red, pink wound bed without slough.  Wound Description (Comments):   Present on Admission: Yes     Pressure Injury 02/09/20 Buttocks Left Stage 2 -  Partial thickness loss of dermis presenting as a shallow open injury with a red, pink wound bed without slough. Above previous wound (Active)  02/09/20 1459  Location: Buttocks  Location Orientation: Left  Staging: Stage 2 -  Partial thickness loss of dermis presenting as a shallow open injury with a red, pink wound bed without slough.  Wound Description (Comments): Above previous wound  Present on Admission: Yes    Physical Exam: BP (!) 148/83 (BP Location: Right Arm)   Pulse 90   Temp (!) 97.2 F (36.2 C)   Resp 18   Ht 5\' 8"  (1.727 m)   Wt 53.4 kg   SpO2 97%   BMI 17.90 kg/m  Constitutional: No distress . Vital signs reviewed. HENT: Normocephalic.  Atraumatic. Eyes: EOMI. No discharge. Cardiovascular: No JVD.  RRR. Respiratory: Normal effort.  No stridor.  Bilateral clear to auscultation.  + [place]. GI: Non-distended.  BS +. GU: + Foley. Skin: Warm and dry.  Intact. Psych: Normal mood.  Normal behavior. Musc:  Right AKA with edema and tenderness, improving Left lower extremity edema, unchanged Neuro: Alert Very HOH Motor: Bilateral  upper extremities: 5/5 proximal distal Right lower extremity: Hip flexion 3+/5 (pain inhibition), stable Left lower extremity: Hip flexion, knee extension, ankle dorsiflexion 4 -/5, stable  Assessment/Plan: 1. Functional deficits which require 3+ hours per day of interdisciplinary therapy in a comprehensive inpatient rehab setting.  Physiatrist is providing close team supervision and 24 hour management of active medical problems listed below.  Physiatrist and rehab team continue to assess barriers to discharge/monitor  patient progress toward functional and medical goals   Care Tool:  Bathing    Body parts bathed by patient: Right arm,Left arm,Chest,Abdomen,Front perineal area,Left upper leg,Face   Body parts bathed by helper: Buttocks,Left lower leg Body parts n/a: Right upper leg,Right lower leg   Bathing assist Assist Level: Moderate Assistance - Patient 50 - 74%     Upper Body Dressing/Undressing Upper body dressing   What is the patient wearing?: Pull over shirt    Upper body assist Assist Level: Supervision/Verbal cueing    Lower Body Dressing/Undressing Lower body dressing      What is the patient wearing?: Pants     Lower body assist Assist for lower body dressing: Minimal Assistance - Patient > 75%     Toileting Toileting    Toileting assist Assist for toileting: 2 Helpers     Transfers Chair/bed transfer  Transfers assist  Chair/bed transfer activity did not occur: Refused  Chair/bed transfer assist level: Contact Guard/Touching assist     Locomotion Ambulation   Ambulation assist      Assist level: 2 helpers Assistive device: Parallel bars Max distance: 44ft   Walk 10 feet activity   Assist  Walk 10 feet activity did not occur: Safety/medical concerns (fatigue, generalized weakness, poor endurance)        Walk 50 feet activity   Assist Walk 50 feet with 2 turns activity did not occur: Safety/medical concerns (fatigue, generalized weakness, poor endurance)         Walk 150 feet activity   Assist Walk 150 feet activity did not occur: Safety/medical concerns (fatigue, generalized weakness, poor endurance)         Walk 10 feet on uneven surface  activity   Assist Walk 10 feet on uneven surfaces activity did not occur: Safety/medical concerns (fatigue, generalized weakness, poor endurance)         Wheelchair     Assist Will patient use wheelchair at discharge?: Yes Type of Wheelchair: Manual    Wheelchair assist level:  Supervision/Verbal cueing Max wheelchair distance: 43ft    Wheelchair 50 feet with 2 turns activity    Assist        Assist Level: Supervision/Verbal cueing   Wheelchair 150 feet activity     Assist      Assist Level: Maximal Assistance - Patient 25 - 49%    Medical Problem List and Plan: 1.  Decreased functional mobility secondary to cellulitis left foot as well as right AKA revision 01/27/2020  Continue CIR 2.  Antithrombotics: -DVT/anticoagulation:Lovenox  on 30mg  BID, pt with low body weight will change to daily              -antiplatelet therapy: Aspirin 81 mg daily 3. Pain Management: Tylenol prn per wife   Controlled on 2/1  Monitor with increased exertion. 4. Mood: Provide emotional support  Fluoxetine started on 1/28 for depression             -antipsychotic agents: N/A 5. Neuropsych: This patient is?  Fully capable of making decisions on his own  behalf. 6. Skin/Wound decubitus to coccyx and R AKA.  Skin care as directed.  Routine skin checks- dressing to be changed BID and prn  Monitor wounds 7. Fluids/Electrolytes/Nutrition: Routine in and outs. 8.  ID/cellulitis left foot.  MRSA screen negative.  Blood cultures no growth to date.    Completed Augmentin on 02/05/2020 9.  Atrial flutter/A. fib with RVR.  Toprol 25 mg daily.  Not a candidate for long-term DOAC.  30-day event monitor as outpatient.  Follow-up cardiology services  Controlled on 1/31, appreciate cards recs   Monitor with increased activity 10.  Hyponatremia.  Lasix and Aldactone resumed at lower doses.    Sodium 124 on 2/1  Continue sodium chloride tabs, resumed on 1/31 11.  Chronic diastolic congestive heart failure.    Lasix and Aldactone per cardiology  Followed by cardiology services  Monitor for any signs of fluid overload Filed Weights   02/07/20 0347 02/08/20 0513 02/09/20 0554  Weight: 51.5 kg 51.7 kg 53.4 kg   ?  Reliability on 1/31, no weights for today 12.  Chronic respiratory  failure with hypoxia/COPD.  Chronic oxygen therapy 2 L.  Continue chronic prednisone. 13.  Chronic urinary retention.  Chronic Foley tube followed by urology service [name].    Continue Myrbetriq as well as Proscar.    Change Foley catheter to routinely- last changed 01/16/2020  Discussed with urology-may replace Foley 14.  GERD.  Protonix 15.  Chronic sacral decubitus.  Santyl ointment twice daily.   See #6 16.  Hypothyroidism: Synthroid 17.  Normocytic anemia.  Transfused 1 unit packed red blood cells 01/31/2020.    Hemoglobin 8.9 on 1/31 19.  Hx constipation improved now with cont stool   LOS: 5 days A FACE TO FACE EVALUATION WAS PERFORMED  Trysta Showman Lorie Phenix 02/10/2020, 9:06 AM

## 2020-02-10 NOTE — Progress Notes (Signed)
Physical Therapy Session Note  Patient Details  Name: [name] MRN: [id number] Date of Birth: 05-Feb-1929  Today's Date: 02/10/2020 PT Individual Time: 3244-0102 PT Individual Time Calculation (min): 31 min   Short Term Goals: Week 1:  PT Short Term Goal 1 (Week 1): STG=LTG due to LOS  Skilled Therapeutic Interventions/Progress Updates:    Patient in w/c and relates super fatigued from previous 2 PT sessions this afternoon.  Noted L foot with edema wife reports due to dependency.  States cardiologist wants him to wear compression socks, but vascular surgeon says no. Patient transferred to bed with min A.  Noted w/c with R brake not locking correctly.  Performed seated LAQ x 15 on L.  Sit to supine with S.  Pt needing HOB elevated.  Attempted bridging on L, but pt unable due to [facility] up and breathing difficulty so performed 2 x 5 reps glut sets with 5 sec hold.  Rolled to R for L sidlying hip abduction (clamshell) x 10.  Scooted up in bed with S.  Seated UE therex with yellow t-band x 10 shoulder press, then opposite arm horizontal abduction.  Patient left supine with wife in the room and bed alarm active.   Therapy Documentation Precautions:  Precautions Precautions: Fall Precaution Comments: H/o R AKA (s/p revision 1/18) Restrictions Weight Bearing Restrictions: Yes RLE Weight Bearing: Non weight bearing Pain: Pain Assessment Pain Score: 5  Pain Type: Acute pain Pain Location: Generalized Pain Descriptors / Indicators: Discomfort Pain Onset: On-going Pain Intervention(s): Repositioned   Therapy/Group: Individual Therapy  Reginia Naas  [place], PT 02/10/2020, 5:25 PM

## 2020-02-10 NOTE — Consult Note (Signed)
Neuropsychological Consultation   Patient:   [name]   DOB:   [birth year]  MR Number:  [id number]  Location:  [facility] [id number] [place] [address] Dept: [place]: [phone]           Date of Service:   02/10/2020  Start Time:   3 PM End Time:   4 PM  Provider/Observer:  Ilean Skill, Psy.D.       Clinical Neuropsychologist       Billing Code/Service: 62952  Chief Complaint:    [name] is an 87 year old male with history of significant chronic urinary retention with chronic indwelling Foley catheter tube.  Patient also has hypertension, hyperlipidemia, CAD with CABG 2003, COPD, peripheral vascular disease, diastolic congestive heart failure, sacral ulcer, right AKA 08/11/2019 as well as history of chronic wounds to lower extremities.  Patient received inpatient rehab services 08/21/2019 to 09/01/2019 and was discharged to skilled nursing facility.  Patient presented on 01/24/2020 with left lower extremity cellulitis and has been maintained on antibiotics.  Patient sent to emergency department for possible admission for cellulitis and initially sent home on doxycycline.  He was later seen at wound clinic for his left foot continued to worsen despite antibiotics.  Vascular surgery followed up and the patient was noted to have open wound on his right AKA stump with exposed bone.  Patient underwent revision of right AKA and continue to have issues with left foot cellulitis has been treated with antibiotics but at this point there are no further surgery planned.  Patient also has a history of significant anxiety and has been taking benzo diazepam therapeutically.  While patient has been encouraged to take an SSRI in the past at 1 point he was told that it may cause some increase in anxiety and the patient was worried about any greater anxiety than he was already experiencing.  Up to  this point the patient has refused taking SSRI.  Today, the patient agreed to begin taking an SSRI as it been recommended by both his psychiatrist as well as a family member who is in the medical field.  Reason for Service:  Patient was referred for neuropsychological consultation due to history of significant anxiety and major depressive disorder.  Below is the HPI for the current admission.  HPI: [name]. Fountain is a 87 year old right-handed male with history significant for chronic urinary retention with chronic indwelling Foley catheter tube followed by [name] of urology services last changed 01/16/2020, hypertension, hyperlipidemia, CAD with CABG 2003 followed by cardiology services Dr. Daneen Schick, COPD 2 L oxygen, peripheral vascular disease, diastolic congestive heart failure, sacral decubitus ulcer, right AKA 08/11/2019 as well as history of chronic wounds to lower extremities receiving inpatient rehab services 08/21/2019 to 09/01/2019 and was discharged to skilled nursing facility.  Presented 01/24/2020 with left lower extremity cellulitis he had been seeing his primary care provider maintained on antibiotics.  He was sent to the emergency department for possible admission for cellulitis and initially sent home on doxycycline.  He was later seen at the wound clinic for his left foot continued to worsen despite antibiotics.  Vascular surgery follow-up Dr. Donzetta Matters and on examination was noted to have open wound of the right AKA stump with exposed bone.  X-rays obtained showing some demineralization but no osteomyelitis.  MRI was ordered to evaluate osteomyelitis showing no areas of abnormal marrow signal or cortical destruction.  Patient did undergo revision of right AKA 01/27/2020 per Dr. Donzetta Matters.  Work-up of left lower extremity cellulitis MRSA screen negative blood cultures no growth he did complete 7 days antibiotics but left foot looked worse ESR/CRP slightly elevated placed on Augmentin 01/29/2020 to be  completed 02/05/2020 with improvement in latest WBC 7.7..  Patient had declined amputation of left foot.  Hospital course patient has had some hyponatremia 130-126.  Cardiology services consulted during hospital stay for episodic chest pain/atrial flutter with RVR of new onset.  Placed on Toprol not a candidate for long-term DOAC.  30-day event monitor planned at discharge.  Currently holding Lasix and Aldactone due to hyponatremia and resume as needed.  Palliative care was consulted to establish goals of care.  Patient with decubitus ulcer buttocks Santyl ointment twice daily with wound care nurse follow-up.  Random Covid testing 02/04/2020 negative.  Therapy evaluations completed due to patient decrease in functional ability was admitted for a comprehensive rehab program.  Current Status:  While patient was alert and oriented today he was tearful.  Patient is extremely hard of hearing and his wife was there to facilitate ensuring that he understood what was being asked.  The patient required very high volume speech from others and his wife would have to get right next to his ear for him to comprehend and understand.  Patient described his hesitancy towards SSRI medications in the past but understood the potential benefit and acknowledge significant depression and anxiety over his frailty and numerous medical issues he is coping with and dealing with.  Patient is now being followed by palliative care and his wife reports that they were no longer expecting to do further surgeries with him and trying to treat his wounds with antibiotics rather than further surgery or other aggressive interventions.  Patient understands this plan and is agreeable and at 1 point stated that he has had times where he was ready to stop aggressive care and treatment.  Behavioral Observation: [name]  presents as a 87 y.o.-year-old Right Caucasian Male who appeared his stated age. his dress was Appropriate and he was Well Groomed  and his manners were Appropriate to the situation.  his participation was indicative of Appropriate and Redirectable behaviors.  There were physical disabilities noted.  he displayed an appropriate level of cooperation and motivation.     Interactions:    Active Appropriate, Inattentive and Redirectable  Attention:   abnormal and attention span appeared shorter than expected for age  Memory:   within normal limits; recent and remote memory intact  Visuo-spatial:  not examined  Speech (Volume):  normal  Speech:   normal; normal mood patient is extremely hard of hearing  Thought Process:  Coherent and Relevant  Though Content:  WNL; not suicidal and not homicidal  Orientation:   person, place and situation  Judgment:   Fair  Planning:   Poor  Affect:    Depressed and Tearful  Mood:    Depressed and Dysphoric  Insight:   Fair  Intelligence:   normal  Medical History:   Past Medical History:  Diagnosis Date  . Anxiety   . Arthritis    "generalized" (04/04/2017)  . Asthma   . Basal cell carcinoma    "left ear"  . BPH (benign prostatic hyperplasia)    severe; s/p multiple biopsies  . CAD (coronary artery disease)   . Carotid artery occlusion   . Chronic respiratory failure ([place])   . Chronic rhinitis   .  COPD (chronic obstructive pulmonary disease) (HCC)    2L [place] O2  . Diastolic heart failure ([place]) 2019  . Dilation of biliary tract   . Elevated troponin 03/09/2017  . Gallstones   . GERD (gastroesophageal reflux disease)   . History of blood transfusion    "w/his CABG" (04/04/2017)  . History of kidney stones   . Hyperlipidemia   . Hypertension   . On home oxygen therapy    "~ 24/7" (04/04/2017)  . Peripheral vascular disease ([place])   . Pneumonia 2019  . Squamous cell carcinoma of skin 04/23/2013   in situ-Right flank (txpbx)  . Squamous cell carcinoma of skin 01/23/2017   [place]/ in siut- Left ear rim (CX3FU)  . Syncope and collapse   . Ureteral tumor 08/2015    had endoscopic procedure for evaluation, unable to reach for biopsy  . Urinary catheter (Foley) change required          Patient Active Problem List   Diagnosis Date Noted  . Urinary retention   . Normocytic anemia   . Chronic diastolic congestive heart failure ([place])   . Major depressive disorder with current active episode   . Paroxysmal atrial fibrillation (HCC)   . Right above-knee amputee ([place]) 02/05/2020  . Typical atrial flutter ([place])   . New onset atrial fibrillation ([place])   . Malnutrition of moderate degree 01/30/2020  . Cellulitis of left foot 01/24/2020  . Cellulitis and abscess of left lower extremity 01/24/2020  . Abnormality of gait 10/20/2019  . Pain of left heel 09/01/2019  . Labile blood pressure   . Anxiety state   . Hypothyroidism   . Right hip pain   . Sacral decubitus ulcer   . Post-operative pain   . Chronic systolic congestive heart failure ([place])   . Urinary retention due to benign prostatic hyperplasia 08/20/2019  . SOB (shortness of breath)   . Chronic systolic CHF (congestive heart failure) ([place])   . Hypoalbuminemia due to protein-calorie malnutrition ([place])   . Transaminitis   . Acute blood loss anemia   . Hyponatremia   . Postoperative pain   . Supplemental oxygen dependent   . S/P AKA (above knee amputation) ([place]) 08/14/2019  . Peripheral arterial disease ([place]) 08/11/2019  . Abnormal urinalysis 08/11/2019  . Critical lower limb ischemia ([place]) 08/11/2019  . DNR (do not resuscitate)   . Voiding difficulty   . Palliative care by specialist   . Pressure injury of skin 06/29/2019  . Cellulitis of left lower extremity 06/28/2019  . Cellulitis of right lower extremity 06/27/2019  . AKI (acute kidney injury) ([place]) 06/27/2019  . Headache 10/16/2018  . Severe major depression without psychotic features ([place]) 09/22/2017  . Generalized anxiety disorder 09/22/2017  . DNR (do not resuscitate) discussion 05/10/2017  . Weakness generalized 04/04/2017   . Chest pain 03/24/2017  . AV block, [place] 1 03/24/2017  . CAD (coronary artery disease) 03/24/2017  . Chronic diastolic CHF (congestive heart failure) ([place]) 03/24/2017  . COPD (chronic obstructive pulmonary disease) ([place]) 03/24/2017  . NSTEMI (non-ST elevated myocardial infarction) ([place])   . Elevated troponin 03/09/2017  . Acute on chronic diastolic heart failure ([place])   . Tachycardia 03/08/2017  . Influenza A 02/04/2017  . Cor pulmonale, chronic (HCC)/ clinical dx  09/13/2016  . Orthostatic hypotension 09/26/2015  . Angina at rest [facility]) 09/24/2015  . BPH (benign prostatic hyperplasia) 09/13/2015  . GERD (gastroesophageal reflux disease) 09/13/2015  . Anxiety 09/13/2015  . Sinusitis, chronic 09/09/2015  .  DOE (dyspnea on exertion) 04/15/2015  . Acute heart failure ([place]) 03/18/2015  . Right calf pain 06/18/2014  . Coronary artery disease involving coronary bypass graft of native heart with angina pectoris ([place])   . Peripheral vascular disease ([place])   . Essential hypertension   . Obstructive sleep apnea   . Rhinitis, chronic 01/29/2007  . COPD   GOLD IV/ 02/steroid dep  01/29/2007  . Chronic respiratory failure with hypoxia and hypercapnia ([place]) 01/29/2007  . Hyperlipidemia      Psychiatric History:  Patient with past medical history of significant anxiety and worry and history of major depressive disorder.  He has been resistant and reluctant for SSRI medication.  Patient and wife talked like they at not agreed to taking an SSRI I do see that was recently ordered but I am unsure whether the patient has been taking Prozac or not.  Family Med/Psych History:  Family History  Problem Relation Age of Onset  . Heart disease Mother   . Heart disease Father        Before age 49  . Breast cancer Mother   . Cancer Mother        Breast cancer  . Hypertension Mother   . Other Mother        AAA  and   Amputation  . Heart attack Father   . Stroke Brother        x3, still living    . Peripheral vascular disease Brother   . Heart disease Brother   . Hyperlipidemia Brother   . Hypertension Brother   . Heart disease Brother   . Allergies Brother      Impression/DX:  [name] is an 87 year old male with history of significant chronic urinary retention with chronic indwelling Foley catheter tube.  Patient also has hypertension, hyperlipidemia, CAD with CABG 2003, COPD, peripheral vascular disease, diastolic congestive heart failure, sacral ulcer, right AKA 08/11/2019 as well as history of chronic wounds to lower extremities.  Patient received inpatient rehab services 08/21/2019 to 09/01/2019 and was discharged to skilled nursing facility.  Patient presented on 01/24/2020 with left lower extremity cellulitis and has been maintained on antibiotics.  Patient sent to emergency department for possible admission for cellulitis and initially sent home on doxycycline.  He was later seen at wound clinic for his left foot continued to worsen despite antibiotics.  Vascular surgery followed up and the patient was noted to have open wound on his right AKA stump with exposed bone.  Patient underwent revision of right AKA and continue to have issues with left foot cellulitis has been treated with antibiotics but at this point there are no further surgery planned.  Patient also has a history of significant anxiety and has been taking benzo diazepam therapeutically.  While patient has been encouraged to take an SSRI in the past at 1 point he was told that it may cause some increase in anxiety and the patient was worried about any greater anxiety than he was already experiencing.  Up to this point the patient has refused taking SSRI.  Today, the patient agreed to begin taking an SSRI as it been recommended by both his psychiatrist as well as a family member who is in the medical field.  While patient was alert and oriented today he was tearful.  Patient is extremely hard of hearing and his wife was  there to facilitate ensuring that he understood what was being asked.  The patient required very high volume speech  from others and his wife would have to get right next to his ear for him to comprehend and understand.  Patient described his hesitancy towards SSRI medications in the past but understood the potential benefit and acknowledge significant depression and anxiety over his frailty and numerous medical issues he is coping with and dealing with.  Patient is now being followed by palliative care and his wife reports that they were no longer expecting to do further surgeries with him and trying to treat his wounds with antibiotics rather than further surgery or other aggressive interventions.  Patient understands this plan and is agreeable and at 1 point stated that he has had times where he was ready to stop aggressive care and treatment.   Diagnosis:    Recent revision of AKA/right as well as infection in left foot and history of major depressive disorder with reluctant to take SSRI medications in the past and generalized anxiety disorder.        Electronically Signed   _______________________ Ilean Skill, Psy.D. Clinical Neuropsychologist

## 2020-02-10 NOTE — Progress Notes (Signed)
Physical Therapy Session Note  Patient Details  Name: INMAN FETTIG MRN: [id number] Date of Birth: 1930-01-08  Today's Date: 02/10/2020 PT Individual Time: 0101-0131 PT Individual Time Calculation (min): 30 min   Short Term Goals: Week 1:  PT Short Term Goal 1 (Week 1): STG=LTG due to LOS  Skilled Therapeutic Interventions/Progress Updates:    pt received in sitting at edge of bed and agreeable to therapy, wife present. Pt directed in scooting to edge of bed and laterally for transfer to WC, CGA, then min A for squat pivot to WC at pt's Lt. Pt then taken to gym with 2L O2 on tank with nasal cannula, total A for time and energy conservation. Pt directed in WC mobility CGA with PT managing O2 tubing, for 15' then pt reported increased fatigue and unable to continue. Pt required increased time for recovery post this then agreeable for LLE strengthening exercises, denied weighted resistance at this time. Pt then directed in x10 marching and LAQ with visual demonstration and prolonged rest break between sets. Pt then returned to room, total A, left in [facility] wife present. Alarm belt set, All needs in reach and in good condition. Call light in hand.    Therapy Documentation Precautions:  Precautions Precautions: Fall Precaution Comments: H/o R AKA (s/p revision 1/18) Restrictions Weight Bearing Restrictions: Yes RLE Weight Bearing: Non weight bearing    Therapy/Group: Individual Therapy  Junie Panning 02/10/2020, 3:28 PM

## 2020-02-10 NOTE — Progress Notes (Signed)
Progress Note  Patient Name: [name] Date of Encounter: 02/10/2020  [facility] Cardiologist: Sinclair Grooms, MD   Subjective   Feeling well.  Complains of bladder spasm.  Inpatient Medications    Scheduled Meds: . (feeding supplement) PROSource Plus  30 mL Oral BID BM  . aspirin EC  81 mg Oral Daily  . atorvastatin  40 mg Oral QHS  . budesonide-formoterol  2 puff Inhalation BID  . Chlorhexidine Gluconate Cloth  6 each Topical Daily  . cholecalciferol  2,000 Units Oral Q lunch  . enoxaparin (LOVENOX) injection  40 mg Subcutaneous Q24H  . finasteride  5 mg Oral Q lunch  . FLUoxetine  10 mg Oral Daily  . fluticasone  2 spray Each Nare Daily  . guaiFENesin  600 mg Oral BID  . levothyroxine  50 mcg Oral Daily  . metoprolol succinate  25 mg Oral Daily  . mirabegron ER  25 mg Oral Daily  . multivitamin with minerals  1 tablet Oral Daily  . pantoprazole  40 mg Oral Daily  . predniSONE  10 mg Oral Daily  . saccharomyces boulardii  250 mg Oral BID  . sodium chloride  1 g Oral BID WC  . spironolactone  12.5 mg Oral Daily  . Tiotropium Bromide Monohydrate  2 puff Inhalation Daily  . vitamin B-12  1,000 mcg Oral Daily   Continuous Infusions:  PRN Meds: acetaminophen, albuterol, ALPRAZolam, bisacodyl, calcium carbonate, HYDROcodone-acetaminophen, nitroGLYCERIN, polyethylene glycol, polyvinyl alcohol, senna-docusate   Vital Signs    Vitals:   02/09/20 1522 02/09/20 1946 02/09/20 2113 02/10/20 0459  BP: 134/68 130/61  (!) 148/83  Pulse: 74 78 76 90  Resp: 18 18 19 18   Temp: 98 F (36.7 C) 97.6 F (36.4 C)  (!) 97.2 F (36.2 C)  TempSrc:      SpO2: 100% 100% 100% 97%  Weight:      Height:        Intake/Output Summary (Last 24 hours) at 02/10/2020 0932 Last data filed at 02/10/2020 0900 Gross per 24 hour  Intake 560 ml  Output 1725 ml  Net -1165 ml   Last 3 Weights 02/09/2020 02/08/2020 02/07/2020  Weight (lbs) 117 lb 11.6 oz 113 lb 15.7 oz 113 lb 8.6 oz   Weight (kg) 53.4 kg 51.7 kg 51.5 kg      Telemetry    n/a - Personally Reviewed  ECG    n/a - Personally Reviewed  Physical Exam   VS:  BP (!) 148/83 (BP Location: Right Arm)   Pulse 90   Temp (!) 97.2 F (36.2 C)   Resp 18   Ht 5\' 8"  (1.727 m)   Wt 53.4 kg   SpO2 97%   BMI 17.90 kg/m  , BMI Body mass index is 17.9 kg/m. GENERAL:  Frail.  Chronically ill-appearing HEENT: Pupils equal round and reactive, fundi not visualized, oral mucosa unremarkable NECK:  No jugular venous distention, waveform within normal limits, carotid upstroke brisk and symmetric, no bruits LUNGS: CTAB. HEART:  RRR.  PMI not displaced or sustained,S1 and S2 within normal limits, no S3, no S4, no clicks, no rubs, no murmurs ABD:  Flat, positive bowel sounds normal in frequency in pitch, no bruits, no rebound, no guarding, no midline pulsatile mass, no hepatomegaly, no splenomegaly EXT: , 2+ L LE edema, no cyanosis no clubbing SKIN:  No rashes no nodules NEURO:  Cranial nerves II through XII grossly intact, motor grossly intact throughout PSYCH:  Cognitively intact, oriented to person place and time   Labs    High Sensitivity Troponin:   Recent Labs  Lab 01/26/20 1406 01/26/20 1536  TROPONINIHS 107* 115*      Chemistry Recent Labs  Lab 02/06/20 0805 02/07/20 0619 02/10/20 0646  NA 130* 128* 124*  K 4.2 3.7 4.0  CL 91* 93* 89*  CO2 30 29 30   GLUCOSE 120* 82 89  BUN 16 14 13   CREATININE 0.94 0.80 0.76  CALCIUM 9.2 8.6* 8.8*  PROT 5.2*  --   --   ALBUMIN 3.1*  --   --   AST 28  --   --   ALT 28  --   --   ALKPHOS 55  --   --   BILITOT 1.1  --   --   GFRNONAA >60 >60 >60  ANIONGAP 9 6 5      Hematology Recent Labs  Lab 02/06/20 0805 02/08/20 0502 02/09/20 0607  WBC 7.7 7.3 7.2  RBC 2.90* 2.69* 2.85*  HGB 9.5* 8.5* 8.9*  HCT 28.0* 26.1* 27.1*  MCV 96.6 97.0 95.1  MCH 32.8 31.6 31.2  MCHC 33.9 32.6 32.8  RDW 13.6 13.9 13.7  PLT 292 263 290    BNPNo results for  input(s): BNP, PROBNP in the last 168 hours.   DDimer No results for input(s): DDIMER in the last 168 hours.   Radiology    DG CHEST PORT 1 VIEW  Result Date: 02/08/2020 CLINICAL DATA:  New onset atrial flutter to atrial fibrillation. EXAM: PORTABLE CHEST 1 VIEW COMPARISON:  Chest radiograph dated 01/30/2020 FINDINGS: The heart size is normal. Vascular calcifications are seen in the aortic arch. The lungs are hyperinflated. Scarring in the left costophrenic angle is redemonstrated. Otherwise, both lungs are clear. The bones are osteopenic and degenerative changes are seen in the left shoulder. Median sternotomy wires are noted. IMPRESSION: 1. No acute cardiopulmonary findings. 2. Hyperinflation suggesting chronic obstructive pulmonary disease. Electronically Signed   By: Zerita Boers M.D.   On: 02/08/2020 12:44    Cardiac Studies   ECHO 01/28/2020  1. Left ventricular ejection fraction, by estimation, is 60 to 65%. The  left ventricle has normal function. The left ventricle has no regional  wall motion abnormalities. There is mild concentric left ventricular  hypertrophy. Left ventricular diastolic  parameters are indeterminate.  2. Right ventricular systolic function is normal. The right ventricular  size is normal.  3. The mitral valve is normal in structure. No evidence of mitral valve  regurgitation. No evidence of mitral stenosis.  4. The aortic valve is tricuspid. There is mild calcification of the  aortic valve. There is mild thickening of the aortic valve. Aortic valve  regurgitation is not visualized. Mild aortic valve sclerosis is present,  with no evidence of aortic valve  stenosis.  5. The inferior vena cava is normal in size with greater than 50%  respiratory variability, suggesting right atrial pressure of 3 mmHg.   NM Myoview 2019: IMPRESSION: 1. Large infarct involving the inferior wall including the inferior apex. Difficult to exclude peri-infarct ischemia  at the apex and recommend correlation with ECG testing.  2. Abnormal wall motion along the inferior wall base and septal wall as described.  3. Left ventricular ejection fraction is 48%.  4. Non invasive risk stratification*: Intermediate  Patient Profile     87 y.o. male with CAD s/p CABG in 123456, chronic diastolic heart failure, advanced PAD, COPD on home O2, hypertension admitted  for right AKA 01/27/2020.  He developed postoperative atrial fibrillation/flutter on 1/24, which was a new finding.  Assessment & Plan    # Atrial fibrillation/flutter: He had an 8 hour episode of atrial flutter alternating with atrial fibrillation.  He converted to sinus rhythm spontaneously.  His beta blocker was increased.  Plan for 30 day event monitor on discharge from CIR. No plan for long term anticoagulation any unless he has recurrence.  # CAD: CABG in 2003.  He had a Myoview in 2019 with inferior infarct with peri-infarct ischemia.  Continue aspirin and atorvastatin.  # PAD: Status post right AKA this admission.  He was offered left foot amputation but declined.  Continue aspirin and atorvastatin.  Continue metoprolol.  # Acute on chronic diastolic heart failure: Volume status is challenging to assess.  He previously developed edema after stopping diuretics.  Furosemide and spironolactone were resumed at lower doses.  He was net -1.2 L yesterday, yet his edema persists.  Despite diuresis his hyponatremia is worsening, suggesting it is not hypervolemic hyponatremia.  Weight is not yet recorded.  His oral intake is minimal, so that is not contributing.  He reportedly responded well to salt tabs in the past and this has been restarted.  Suspect this will worsen his edema, but we will hold lasix while he gets salt.  Will check a BNP and place TED hose.  There was no pulmonary edema on CXR two days ago and his net I/O since then have been negative.  Consider tolvaptan if sodium is not improving tomorrow.         For questions or updates, please contact [place] Please consult [email or web address] for contact info under        Signed, Skeet Latch, MD  02/10/2020, 9:32 AM

## 2020-02-10 NOTE — Progress Notes (Signed)
COMMUNITY PALLIATIVE CARE RN NOTE  PATIENT NAME: [name] DOB: [birth year] MRN: [id number]  PRIMARY CARE PROVIDER: [name], MD  RESPONSIBLE PARTY: [name] (wife) Acct ID - Guarantor Home Phone Work Phone Relationship Acct Type  [id number] - HAZIM, TREADWAY [phone]  Self P/F     [address] [place], [place] [address]   Due to the COVID-19 crisis, this virtual check-in visit was done via telephone from my office and it was initiated and consent by this patient and or family.  PLAN OF CARE and INTERVENTION:  1. ADVANCE CARE PLANNING/GOALS OF CARE: Goal is for patient to remain at home with his wife. He is a Full code. 2. PATIENT/CAREGIVER EDUCATION: Symptom management, pain management, edema and skin breakdown prevention/minimization 3. DISEASE STATUS: Virtual check-in visit completed via telephone. Wife states that patient was recently seen in the ED, as directed by wound care for skin breakdown and possible cellulitis in his left foot. He was treated with a dose of IV antibiotics and released with a 7 day course of Doxycyline. He continues with pain in his left foot. He also has continued swelling in his left lower extremity which is painful at times. He is taking Lasix daily. Another concern is that last night, the area of patient's right stump incision from a R above the knee amputation, opened up and started bleeding. She has an appointment with Dr. Harrington Challenger today to get this assessed. Wife says that patient's PT/OT has been placed on hold for now due to increased weakness and fatigue. She continues with a home health RN to assist with wound care. She says she is starting to become frustrated and overwhelmed. She says that patient does not want another amputation if it becomes indicated. He also wants to remain a full code. We discussed hospice in length, as we have done several times in the past and patient has also had an [facility] admissions nurse come out to their home  and review services and to admit him to services, but they refused at that time. One of the barriers is the fact that hospice does not cover Trilogy ventilators so this could possibly become an out of pocket expense for them. Both patient and wife is not agreeable to switching him to any other device such as a bi-pap. He is oxygen dependent wearing 3L/min via  during the day and Trilogy at night. Wife also wanted to make sure that Palliative care would be able to assist her with nursing home placement when the time comes. I assured her we do have a medical social worker that can assist. Will continue to monitor.     HISTORY OF PRESENT ILLNESS:  This is a 87 yo male with a h/o CHF, PVD, CAD COPD, and NSTEMI. Palliative care team continues to follow patient and visits monthly and PRN   CODE STATUS: Full code  ADVANCED DIRECTIVES: Y MOST FORM: no PPS: 40%    (Duration of visit and documentation 30 minutes)   [name], RN BSN

## 2020-02-10 NOTE — Progress Notes (Signed)
Occupational Therapy Session Note  Patient Details  Name: [name] MRN: [id number] Date of Birth: 1929/10/06  Today's Date: 02/10/2020 OT Individual Time: 1034-1130 OT Individual Time Calculation (min): 56 min    Short Term Goals: Week 1:  OT Short Term Goal 1 (Week 1): Pt will complete toilet transfers with mod assist OT Short Term Goal 2 (Week 1): Pt will complete LB dressing mod assist at sit > stand level OT Short Term Goal 3 (Week 1): Pt will complete toileting tasks with mod assist  Skilled Therapeutic Interventions/Progress Updates:    Treatment session with focus on functional transfers and BUE strengthening.  Pt received upright in w/c already dressed.  Pt reports "I don't know what I can do today".  Pt continues to report fatigue and c/o weakness and "inability" to complete tasks he could complete prior to AKA revision.  Pt attributes it to his arms and legs being week.  Pt receptive to addressing BUE strengthening.  Engaged in 2 sets of 15 chest presses and bicep curls with 2# dowel with pt requiring prolonged rest break between.  Pt completed w/c pushups 2 sets of 5 with supervision and encouragement.  Pt reports arm rests "too close" for mobility - wife confirms that pt has 18" width w/c at home.  Pt completed squat pivot transfer w/c > mat > new w/c with CGA.  Therapist swapped out form 16" width to 18" width w/c.  Pt reports improved fit and UE positioning for w/c.  Pt completed set of 5 pushups in improved position in wider w/c.  Returned to room and transferred back to bed with CGA and cues for sequencing. Pt remained in semi-reclined position and left with all needs in reach.  Therapy Documentation Precautions:  Precautions Precautions: Fall Precaution Comments: H/o R AKA (s/p revision 1/18) Restrictions Weight Bearing Restrictions: Yes RLE Weight Bearing: Non weight bearing General:   Vital Signs: Therapy Vitals Temp: 98.6 F (37 C) Temp Source: Oral Pulse Rate:  76 Resp: 18 BP: (!) 112/54 Patient Position (if appropriate): Sitting Oxygen Therapy SpO2: 100 % O2 Device: Nasal Cannula Pain: Pt with no c/o pain  Therapy/Group: Individual Therapy  [name] 02/10/2020, 3:24 PM

## 2020-02-10 NOTE — Progress Notes (Signed)
Physical Therapy Session Note  Patient Details  Name: [name] MRN: [id number] Date of Birth: [birth year]  Today's Date: 02/10/2020 PT Individual Time: 4401-0272 and 5366-4403 PT Individual Time Calculation (min): 41 min and 38 min   Short Term Goals: Week 1:  PT Short Term Goal 1 (Week 1): STG=LTG due to LOS  Skilled Therapeutic Interventions/Progress Updates:   Treatment Session 1: 4742-5956 41 min Received pt supine in bed asleep, pt agreeable to therapy, and did not state pain level during session. [name] reported he slept for 45 minutes last night due to bladder spasms and c/o pain in L heel. Session with emphasis on functional mobility/transfers, generalized strengthening, dynamic sitting balance/coordination, and improved activity tolerance. Pt on 2L O2 via [place] with O2 sat >98% throughout session, however pt continues to report SOB. Donned pants in supine with max A +2 as pt closing eyes while getting dressed. Pt sat up in bed and doffed dirty gown and donned clean pull over shirt with mod A +2 as pt's wife eager to assist with dressing. Donned additional jacket in same manner and pt transferred supine<>sitting EOB with CGA and increased time. Pt stated "you just don't know how horrible I feel" but was agreeable to attempt to get in [facility]. Doffed L sock and donned non-skid sock total A. Noted pt's L foot swollen and with increased redness today; RN and PA made aware. Pt stated "I don't know why you're going through all this trouble, I'm going to die anyway." Therapist and wife provided emotional support. Pt transported to dayroom in [facility] total A and performed LLE strengthening on Kinetron at 40 cm/sec for 1 minute x 2 trials with therapist providing manual counter resistance. Pt transported back to room in [facility] total A. Concluded session with pt sitting in WC, needs within reach, and seatbelt alarm on. Wife present at bedside.   Treatment Session 2: 3875-6433 38 min Received pt sitting in [facility] with  wife present at bedside, pt agreeable to therapy, and did not state pain level during session. Session with emphasis on functional mobility/transfers, generalized strengthening, dynamic sitting balance/coordination, simulated car transfers, and improved activity tolerance. Pt transported to ortho gym in [facility] total A for energy conservation purposes and performed simulated car transfer with min A via lateral scoot with min A for LLE management. O2 sat 89% increasing to 98% with extensive rest break and cues for pursed lip breathing. Pt initially stated "I can't do this" referring to car transfer and required maximal encouragement to attempt. Pt performed BUE strengthening on UBE at level 1 for 2 minutes forwards and 2 minutes backwards with 1 rest break with supervision for improved strengthening and endurance. Pt reported feeling anxious and feeling SOB. O2 sat 96% and therapist educated pt on importance of deep breathing throughout the day to reduce anxiety. Pt expressing to this therapist that he "wants to go home to live the short amount of time that he has left." Will communicate this to rest of care team. Pt transported back to room in [facility] total A. Concluded session with pt sitting in WC, needs within reach, and seatbelt alarm on. NT present assessing vitals and wife present at bedside.   Therapy Documentation Precautions:  Precautions Precautions: Fall Precaution Comments: H/o R AKA (s/p revision 1/18) Restrictions Weight Bearing Restrictions: Yes RLE Weight Bearing: Non weight bearing  Therapy/Group: Individual Therapy Alfonse Alpers PT, DPT   02/10/2020, 7:34 AM

## 2020-02-11 DIAGNOSIS — F332 Major depressive disorder, recurrent severe without psychotic features: Secondary | ICD-10-CM

## 2020-02-11 DIAGNOSIS — R7303 Prediabetes: Secondary | ICD-10-CM

## 2020-02-11 LAB — BASIC METABOLIC PANEL
Anion gap: 7 (ref 5–15)
BUN: 12 mg/dL (ref 8–23)
CO2: 31 mmol/L (ref 22–32)
Calcium: 8.7 mg/dL — ABNORMAL LOW (ref 8.9–10.3)
Chloride: 88 mmol/L — ABNORMAL LOW (ref 98–111)
Creatinine, Ser: 0.9 mg/dL (ref 0.61–1.24)
GFR, Estimated: >60 mL/min (ref >60)
Glucose, Bld: 132 mg/dL — ABNORMAL HIGH (ref 70–99)
Potassium: 3.8 mmol/L (ref 3.5–5.1)
Sodium: 126 mmol/L — ABNORMAL LOW (ref 135–145)

## 2020-02-11 MED ORDER — FUROSEMIDE 10 MG/ML IJ SOLN
40.0000 mg | Freq: Every day | INTRAMUSCULAR | Status: AC
  Administered 2020-02-11 – 2020-02-12 (×2): 40 mg via INTRAVENOUS
  Filled 2020-02-11 (×2): qty 4

## 2020-02-11 NOTE — Progress Notes (Signed)
Progress Note  Patient Name: [name] Date of Encounter: 02/11/2020  [facility] Cardiologist: Sinclair Grooms, MD   Subjective   Feeling well.  Denies chest pain or shortness of breath.  Feeling tired.  Slept much better last night.   Inpatient Medications    Scheduled Meds: . (feeding supplement) PROSource Plus  30 mL Oral BID BM  . aspirin EC  81 mg Oral Daily  . atorvastatin  40 mg Oral QHS  . budesonide-formoterol  2 puff Inhalation BID  . Chlorhexidine Gluconate Cloth  6 each Topical Daily  . cholecalciferol  2,000 Units Oral Q lunch  . enoxaparin (LOVENOX) injection  40 mg Subcutaneous Q24H  . finasteride  5 mg Oral Q lunch  . FLUoxetine  10 mg Oral Daily  . fluticasone  2 spray Each Nare Daily  . guaiFENesin  600 mg Oral BID  . levothyroxine  50 mcg Oral Daily  . metoprolol succinate  25 mg Oral Daily  . mirabegron ER  25 mg Oral Daily  . multivitamin with minerals  1 tablet Oral Daily  . pantoprazole  40 mg Oral Daily  . predniSONE  10 mg Oral Daily  . saccharomyces boulardii  250 mg Oral BID  . sodium chloride  1 g Oral BID WC  . spironolactone  12.5 mg Oral Daily  . Tiotropium Bromide Monohydrate  2 puff Inhalation Daily  . vitamin B-12  1,000 mcg Oral Daily   Continuous Infusions:  PRN Meds: acetaminophen, albuterol, ALPRAZolam, bisacodyl, calcium carbonate, HYDROcodone-acetaminophen, nitroGLYCERIN, polyethylene glycol, polyvinyl alcohol, senna-docusate   Vital Signs    Vitals:   02/10/20 1412 02/10/20 1941 02/11/20 0344 02/11/20 0424  BP: (!) 112/54 (!) 105/59 127/65   Pulse: 76 84 79   Resp: 18 18 18    Temp: 98.6 F (37 C) 98.1 F (36.7 C) 98 F (36.7 C)   TempSrc: Oral Oral Oral   SpO2: 100% 100% 92%   Weight:    53.4 kg  Height:        Intake/Output Summary (Last 24 hours) at 02/11/2020 0953 Last data filed at 02/11/2020 0429 Gross per 24 hour  Intake 649 ml  Output 2400 ml  Net -1751 ml   Last 3 Weights 02/11/2020 02/09/2020  02/08/2020  Weight (lbs) 117 lb 11.6 oz 117 lb 11.6 oz 113 lb 15.7 oz  Weight (kg) 53.4 kg 53.4 kg 51.7 kg      Telemetry    n/a - Personally Reviewed  ECG    n/a - Personally Reviewed  Physical Exam   VS:  BP 127/65 (BP Location: Right Arm)   Pulse 79   Temp 98 F (36.7 C) (Oral)   Resp 18   Ht 5\' 8"  (1.727 m)   Wt 53.4 kg   SpO2 92%   BMI 17.90 kg/m  , BMI Body mass index is 17.9 kg/m. GENERAL:  Frail.  Chronically ill-appearing HEENT: Pupils equal round and reactive, fundi not visualized, oral mucosa unremarkable NECK:  No jugular venous distention, waveform within normal limits, carotid upstroke brisk and symmetric, no bruits LUNGS: CTAB. HEART:  RRR.  PMI not displaced or sustained,S1 and S2 within normal limits, no S3, no S4, no clicks, no rubs, no murmurs ABD:  Flat, positive bowel sounds normal in frequency in pitch, no bruits, no rebound, no guarding, no midline pulsatile mass, no hepatomegaly, no splenomegaly EXT: , 1+ L LE edema, no cyanosis no clubbing SKIN:  No rashes no nodules NEURO:  Cranial nerves II through XII grossly intact, motor grossly intact throughout [facility]:  Cognitively intact, oriented to person place and time   Labs    High Sensitivity Troponin:   Recent Labs  Lab 01/26/20 1406 01/26/20 1536  TROPONINIHS 107* 115*      Chemistry Recent Labs  Lab 02/06/20 0805 02/07/20 0619 02/10/20 0646  NA 130* 128* 124*  K 4.2 3.7 4.0  CL 91* 93* 89*  CO2 30 29 30   GLUCOSE 120* 82 89  BUN 16 14 13   CREATININE 0.94 0.80 0.76  CALCIUM 9.2 8.6* 8.8*  PROT 5.2*  --   --   ALBUMIN 3.1*  --   --   AST 28  --   --   ALT 28  --   --   ALKPHOS 55  --   --   BILITOT 1.1  --   --   GFRNONAA >60 >60 >60  ANIONGAP 9 6 5      Hematology Recent Labs  Lab 02/06/20 0805 02/08/20 0502 02/09/20 0607  WBC 7.7 7.3 7.2  RBC 2.90* 2.69* 2.85*  HGB 9.5* 8.5* 8.9*  HCT 28.0* 26.1* 27.1*  MCV 96.6 97.0 95.1  MCH 32.8 31.6 31.2  MCHC 33.9 32.6 32.8   RDW 13.6 13.9 13.7  PLT 292 263 290    BNP Recent Labs  Lab 02/10/20 0646  BNP 334.1*     DDimer No results for input(s): DDIMER in the last 168 hours.   Radiology    No results found.  Cardiac Studies   ECHO 01/28/2020  1. Left ventricular ejection fraction, by estimation, is 60 to 65%. The  left ventricle has normal function. The left ventricle has no regional  wall motion abnormalities. There is mild concentric left ventricular  hypertrophy. Left ventricular diastolic  parameters are indeterminate.  2. Right ventricular systolic function is normal. The right ventricular  size is normal.  3. The mitral valve is normal in structure. No evidence of mitral valve  regurgitation. No evidence of mitral stenosis.  4. The aortic valve is tricuspid. There is mild calcification of the  aortic valve. There is mild thickening of the aortic valve. Aortic valve  regurgitation is not visualized. Mild aortic valve sclerosis is present,  with no evidence of aortic valve  stenosis.  5. The inferior vena cava is normal in size with greater than 50%  respiratory variability, suggesting right atrial pressure of 3 mmHg.   NM Myoview 2019: IMPRESSION: 1. Large infarct involving the inferior wall including the inferior apex. Difficult to exclude peri-infarct ischemia at the apex and recommend correlation with ECG testing.  2. Abnormal wall motion along the inferior wall base and septal wall as described.  3. Left ventricular ejection fraction is 48%.  4. Non invasive risk stratification*: Intermediate  Patient Profile     87 y.o. male with CAD s/p CABG in 3762, chronic diastolic heart failure, advanced PAD, COPD on home O2, hypertension admitted for right AKA 01/27/2020.  He developed postoperative atrial fibrillation/flutter on 1/24, which was a new finding.  Assessment & Plan    # Atrial fibrillation/flutter: He had an 8 hour episode of atrial flutter alternating with  atrial fibrillation.  He converted to sinus rhythm spontaneously.  His beta blocker was increased.  Plan for 30 day event monitor on discharge from CIR. No plan for long term anticoagulation any unless he has recurrence.  # CAD: CABG in 2003.  He had a Myoview in 2019 with inferior infarct  with peri-infarct ischemia.  Continue aspirin and atorvastatin.  # PAD: Status post right AKA this admission.  He was offered left foot amputation but declined.  Continue aspirin and atorvastatin.  Continue metoprolol.  # Acute on chronic diastolic heart failure: Volume status is challenging to assess.  He previously developed edema after stopping diuretics.  Furosemide and spironolactone were resumed at lower doses.  BNP was elevated.  Increased lasix to 40mg  IV x1 with improvement in edema.  BMP pending.  If renal function and sodium are improving, plan for additional dose of lasix.  Unable to wear compression sock 2/2 PAD.  Continue with elevation.      For questions or updates, please contact [place] Please consult [email or web address] for contact info under        Signed, Skeet Latch, MD  02/11/2020, 9:53 AM

## 2020-02-11 NOTE — Progress Notes (Signed)
Occupational Therapy Session Note  Patient Details  Name: [name] MRN: [id number] Date of Birth: 1929/10/30  Today's Date: 02/11/2020 OT Individual Time: 5573-2202 and 1303-1400 OT Individual Time Calculation (min): 69 min and 57 min   Short Term Goals: Week 1:  OT Short Term Goal 1 (Week 1): Pt will complete toilet transfers with mod assist OT Short Term Goal 2 (Week 1): Pt will complete LB dressing mod assist at sit > stand level OT Short Term Goal 3 (Week 1): Pt will complete toileting tasks with mod assist  Skilled Therapeutic Interventions/Progress Updates:    1) Treatment session with focus on self-care retraining, functional transfers, and BUE strengthening.  Pt received semi-upright in bed reporting "I hoped to be dressed before you came", however pt reports increased fatigue and difficulty focusing this session.  Pt completed UB dressing with setup.  Pt able to thread LLE in to underwear but became frustrated by foley bag when donning pants therefore therapist donned L pant leg.  Pt completed lateral leans seated EOB to pull underwear over hips.  Pt reports unable to fully advance, requested to do push up from EOB and therapist pull pants over hips.  Pt able to push up x2 to allow therapist to pull up underwear and then pull up pants.  Pt reports typically pulling up on arm rests of w/c and wife assisting with pants.  Pt required frequent rest breaks and frequently stating "I don't think I'm going to make it".  Reassurance provided and cues for breathing technique.  Pt completed lateral scoot transfer CGA bed > w/c.  Engaged in [place] with level 1 theraband.  Pt completing 2 sets of 10 forward punches and downward elbow extension for UBE strengthening as needed for transfers and LB dressing.  Pt returned to room and remained upright in w/c with seat belt alarm on and all needs in reach.  2) Treatment session with focus on activity tolerance and BUE strengthening. Pt received  upright in w/c with SWK present discussing conference with pt and wife.  [name] asking if d/c date "set in stone" as she wants pt to be a good as possible upon d/c.  Discussed ongoing anxiety in hospital setting and current CGA for transfers. Engaged in [place] in therapy gym with 1kg medicine ball completing 2 sets of 10 over head presses and chest presses as well as trunk rotation.  Completed 2 sets of 10 lateral rows with 3# dowel.  Pt requiring prolonged rest breaks after each set.  Pt reports feeling like he can't breathe, O2 sats 98% on 2L O2.  Engaged in 8 mins on UBE with 4 mins forward and 4 mins backward with rest break between each set.  Pt requires frequent encouragement during activities.  Pt returned to room and wife stated "you look more awake than you have all day".  Pt agreeable to remaining upright in w/c to complete lunch.    Therapy Documentation Precautions:  Precautions Precautions: Fall Precaution Comments: H/o R AKA (s/p revision 1/18) Restrictions Weight Bearing Restrictions: Yes RLE Weight Bearing: Non weight bearing Pain:  Pt with no c/o pain   Therapy/Group: Individual Therapy  [name] 02/11/2020, 12:09 PM

## 2020-02-11 NOTE — Progress Notes (Signed)
Physical Therapy Session Note  Patient Details  Name: [name] MRN: [id number] Date of Birth: [birth year]  Today's Date: 02/11/2020 PT Individual Time: 3244-0102 PT Individual Time Calculation (min): 38 min   Short Term Goals: Week 1:  PT Short Term Goal 1 (Week 1): STG=LTG due to LOS  Skilled Therapeutic Interventions/Progress Updates:   Received pt sitting in [facility] with wife present at bedside, pt agreeable to therapy, and reported pain "all over" but declined pain interventions. Session with emphasis on functional mobility/transfers, generalized strengthening, dynamic standing balance/coordination, and improved activity tolerance. Pt transported to therapy gym in [facility] total A for energy conservation purposes and therapist located new 18x16 manual WC due to pt's current WC having broken R brake. Therapist encouraged pt to stand and attempt to pivot into new WC. Pt stated "I can't do it" and required maximal encouragement from therapist. Pt transferred sit<>stand with RW and min A and took 1 hop forwards but then reported feeling too fatigued to pivot remainder of way to WC. Therapist pulled WC behind pt and pt transferred stand<>sit with CGA. Pt then reported having "bladder spasm" and requested to return to bed. Pt transported back to room in [facility] total A and therapist encouraged pt's wife to assist with transfer back to bed, however she politely declined. Pt transferred WC<>bed with close supervision/CGA and sit<>semi-reclined with min A for LLE management. Doffed pants with min A +2 from pt's wife and placed towels underneath pt per pt request. Concluded session with pt semi-reclined in bed, needs within reach, and bed alarm on. RN notified of pt's bladder spasms.   Therapy Documentation Precautions:  Precautions Precautions: Fall Precaution Comments: H/o R AKA (s/p revision 1/18) Restrictions Weight Bearing Restrictions: Yes RLE Weight Bearing: Non weight bearing  Therapy/Group: Individual  Therapy Alfonse Alpers PT, DPT   02/11/2020, 7:36 AM

## 2020-02-11 NOTE — Progress Notes (Signed)
Physical Therapy Session Note  Patient Details  Name: [name] MRN: [id number] Date of Birth: [birth year]  Today's Date: 02/11/2020 PT Individual Time: 1138-1205 PT Individual Time Calculation (min): 27 min   Short Term Goals: Week 1:  PT Short Term Goal 1 (Week 1): STG=LTG due to LOS  Skilled Therapeutic Interventions/Progress Updates: Pt presented in w/c with wife present agreeable to therapy. Pt transported to rehab gym total A for energy conservation. Participated in seated LE therex as follows: LLE AROM LAQ, hip flexion, hip abd/add 2 x 10, hamstring pulls with level 1 resistance band x 10. Pt then indicated felt increased SOB, SpO2 checked 100% on 2L to which pt states "I don't believe it", increased rest break provided and PTA suggested possibly due to increased effort of exercises to which pt agreed. After several minutes rest pt indicated felt a bit better and was able to complete second set of hamstring pulls. Pt then transported to hallway near room and propelled remaining distance ~53f inside room for endurance. Pt left in w/c at end of session with wife present and current needs met.      Therapy Documentation Precautions:  Precautions Precautions: Fall Precaution Comments: H/o R AKA (s/p revision 1/18) Restrictions Weight Bearing Restrictions: Yes RLE Weight Bearing: Non weight bearing General:   Vital Signs: Therapy Vitals Temp: 98.6 F (37 C) Temp Source: Oral Pulse Rate: 75 Resp: 18 BP: 116/61 Patient Position (if appropriate): Sitting Oxygen Therapy O2 Device: Nasal Cannula   Therapy/Group: Individual Therapy  Marshall Kampf  Jasmond [place], PTA  02/11/2020, 4:05 PM

## 2020-02-11 NOTE — Progress Notes (Signed)
[name] upset that urology has not come to see pt yet. She is satisfied for this evening as the foley is currently draining. Primary nurse notified of wife's concern.

## 2020-02-11 NOTE — Patient Care Conference (Signed)
Inpatient RehabilitationTeam Conference and Plan of Care Update Date: 02/11/2020   Time: 11:42 AM    Patient Name: [name]      Medical Record Number: [id number]  Date of Birth: [birth year] Sex: Male         Room/Bed: 4W22C/4W22C-01 Payor Info: Payor: MEDICARE / Plan: MEDICARE PART A AND B / Product Type: *No Product type* /    Admit Date/Time:  02/05/2020  3:20 PM  Primary Diagnosis:  Right above-knee amputee [facility])  Hospital Problems: Principal Problem:   Right above-knee amputee [facility]) Active Problems:   DNR (do not resuscitate) discussion   Normocytic anemia   Chronic diastolic congestive heart failure ([place])   Major depressive disorder with current active episode   Paroxysmal atrial fibrillation [facility])   Urinary retention   Prediabetes    Expected Discharge Date: Expected Discharge Date: 02/16/20  Team Members Present: Physician leading conference: Dr. Delice Lesch Care Coodinator Present: Dorien Chihuahua, RN, BSN, CRRN;Loralee Pacas, LCSWA Nurse Present: Rayne Du, LPN PT Present: Becky Sax, PT OT Present: [name], OT PPS Coordinator present : Gunnar Fusi, Novella Olive, PT     Current Status/Progress Goal Weekly Team Focus  Bowel/Bladder   Permanent foley in place. Continent of bowel. LBM 02/09/2020  Remain cont of Bowel  Assess foley and toilet Qshift and PRN   Swallow/Nutrition/ Hydration             ADL's   lateral scoot/squat pivot transfers CGA, Supervision/setup bathing and dressing from EOB with LB dressing with lateral leans or in bed  Supervision overall, Min A toileting and LB dressing, CGA sit > stand and dynamic standing  ADL retraining, functional transfers, lateral leans, sit > stand, d/c planning   Mobility   bed mobility supervision, lateral scoot transfers CGA, sit<>stand with RW min A, gait 47ft in // bars min A +2  supervision transfers, CGA dynamic standing  functional mobility/transfers, generalized strengthening, dynamic  standing balance/coordination, amputee education, and endurance   Communication             Safety/Cognition/ Behavioral Observations            Pain   No c/o pain  Pain <3  Assess Q shift and PRN   Skin   Surgical incision (Rt AKA), Stage 2 pressure injury  lower mid coccyx; Stage 2 pressure injury left buttock.  Prevent further breakdown and infection.  Assess Q shift and PRN     Discharge Planning:      Team Discussion: Cardiiology following labs/NA level. Patient is confused at times , MD noted questionable underlying dementia. Foley for urinary retention to be exchanged 02/13/20. Continue care for wound on buttocks and amputation site. Progress limited by anxiety, lethargy, confusion and fatigue. Patient on target to meet rehab goals: yes, appears close to baseline functional level. Min assist for sit/stand and lower body ADLs with supervision goals and CGA for standing  *See Care Plan and progress notes for long and short-term goals.   Revisions to Treatment Plan:   Teaching Needs: Transfers, toileting, medications, wound care, foley care, etc  Current Barriers to Discharge: Decreased caregiver support and Wound care  Possible Resolutions to Barriers: Family education with wife Patient has established relationship with [facility] services, home O2 and DME     Medical Summary Current Status: Decreased functional mobility secondary to cellulitis left foot as well as right AKA revision 01/27/2020  Barriers to Discharge: Medical stability;Behavior;Weight bearing restrictions   Possible Resolutions to [employer]  Focus: Therapies, optimize pain meds, follow labs - Na, follow weights, changes foley this week, supplemental oxygen   Continued Need for Acute Rehabilitation Level of Care: The patient requires daily medical management by a physician with specialized training in physical medicine and rehabilitation for the following reasons: Direction of a multidisciplinary  physical rehabilitation program to maximize functional independence : Yes Medical management of patient stability for increased activity during participation in an intensive rehabilitation regime.: Yes Analysis of laboratory values and/or radiology reports with any subsequent need for medication adjustment and/or medical intervention. : Yes   I attest that I was present, lead the team conference, and concur with the assessment and plan of the team.   Dorien Chihuahua B 02/11/2020, 12:50 PM

## 2020-02-11 NOTE — Progress Notes (Addendum)
Patient ID: [name], male   DOB: 03/04/1929, 87 y.o.   MRN: [id number]   This SW covering for assigned SW, Bekcy Dupree.  SW met with pt and pt husband in room to provide updates form team conference, and d/c date 2/7. Pt wife confirms she will be his primary caregiver. She is considering hiring help to assist with his care needs. She states he has a transfer chair already and confirms they are active with HHPT/OT/SN with [facility]. SW to provide sitter list.   SW informed Amy/[facility] on pt discharge date.  *SW provided pt wife with sitter list. SW informed waiting on updates from team on if w/c is needed. States their transport chair was a private purchase. Also, has discussed possible hospital bed with therapy. States she intends to speak with pt about this as he was sleeping at time of visit. SW informed will discuss this with therapy team as well.    , MSW, LCSWA Office: [phone] Cell: [phone] Fax: [phone] 

## 2020-02-11 NOTE — Progress Notes (Signed)
[place] PHYSICAL MEDICINE & REHABILITATION PROGRESS NOTE  Subjective/Complaints: Patient seen this morning sitting up at the edge of his bed this morning.  He states he slept better overnight.  Left lower extremity noted to be in dependent position with increased erythema/edema.  He has questions whether vascular will be evaluating him for his edema in his stump-multiple discussions with patient regarding the same.  He was seen by cardiology yesterday, notes reviewed-Lasix on hold.  ROS: + Baseline shortness of breath, unchanged.  Denies N/V/D  Objective: Vital Signs: Blood pressure 127/65, pulse 79, temperature 98 F (36.7 C), temperature source Oral, resp. rate 18, height 5\' 8"  (1.727 m), weight 53.4 kg, SpO2 92 %. No results found. Recent Labs    02/09/20 0607  WBC 7.2  HGB 8.9*  HCT 27.1*  PLT 290   Recent Labs    02/10/20 0646 02/11/20 0921  NA 124* 126*  K 4.0 3.8  CL 89* 88*  CO2 30 31  GLUCOSE 89 132*  BUN 13 12  CREATININE 0.76 0.90  CALCIUM 8.8* 8.7*    Intake/Output Summary (Last 24 hours) at 02/11/2020 1053 Last data filed at 02/11/2020 0900 Gross per 24 hour  Intake 969 ml  Output 2400 ml  Net -1431 ml     Pressure Injury 01/24/20 Coccyx Mid;Lower Stage 2 -  Partial thickness loss of dermis presenting as a shallow open injury with a red, pink wound bed without slough. (Active)  01/24/20 1445  Location: Coccyx  Location Orientation: Mid;Lower  Staging: Stage 2 -  Partial thickness loss of dermis presenting as a shallow open injury with a red, pink wound bed without slough.  Wound Description (Comments):   Present on Admission: Yes     Pressure Injury 02/09/20 Buttocks Left Stage 2 -  Partial thickness loss of dermis presenting as a shallow open injury with a red, pink wound bed without slough. Above previous wound (Active)  02/09/20 1459  Location: Buttocks  Location Orientation: Left  Staging: Stage 2 -  Partial thickness loss of dermis presenting as  a shallow open injury with a red, pink wound bed without slough.  Wound Description (Comments): Above previous wound  Present on Admission: Yes    Physical Exam: BP 127/65 (BP Location: Right Arm)   Pulse 79   Temp 98 F (36.7 C) (Oral)   Resp 18   Ht 5\' 8"  (1.727 m)   Wt 53.4 kg   SpO2 92%   BMI 17.90 kg/m  Constitutional: No distress . Vital signs reviewed. HENT: Normocephalic.  Atraumatic. Eyes: EOMI. No discharge. Cardiovascular: No JVD.  RRR. Respiratory: Normal effort.  No stridor.  Bilateral clear to auscultation. +[place]. GI: Non-distended.  BS +. GU: + Foley.  Skin: Warm and dry.  Intact. Psych: Normal mood.  Normal behavior. Musc:  Right AKA with edema and tenderness, unchanged Left lower extremity with significant edema. Neuro: Alert Very HOH Motor: Bilateral upper extremities: 5/5 proximal distal Right lower extremity: Hip flexion 3+/5 (pain inhibition), stable Left lower extremity: Hip flexion, knee extension, ankle dorsiflexion 4 -/5, unchanged  Assessment/Plan: 1. Functional deficits which require 3+ hours per day of interdisciplinary therapy in a comprehensive inpatient rehab setting.  Physiatrist is providing close team supervision and 24 hour management of active medical problems listed below.  Physiatrist and rehab team continue to assess barriers to discharge/monitor patient progress toward functional and medical goals   Care Tool:  Bathing    Body parts bathed by patient: Right arm,Left arm,Chest,Abdomen,Front  perineal area,Left upper leg,Face   Body parts bathed by helper: Buttocks,Left lower leg Body parts n/a: Right upper leg,Right lower leg   Bathing assist Assist Level: Moderate Assistance - Patient 50 - 74%     Upper Body Dressing/Undressing Upper body dressing   What is the patient wearing?: Pull over shirt    Upper body assist Assist Level: Supervision/Verbal cueing    Lower Body Dressing/Undressing Lower body dressing      What  is the patient wearing?: Pants     Lower body assist Assist for lower body dressing: Minimal Assistance - Patient > 75%     Toileting Toileting    Toileting assist Assist for toileting: 2 Helpers     Transfers Chair/bed transfer  Transfers assist  Chair/bed transfer activity did not occur: Refused  Chair/bed transfer assist level: Minimal Assistance - Patient > 75%     Locomotion Ambulation   Ambulation assist      Assist level: 2 helpers Assistive device: Parallel bars Max distance: 73ft   Walk 10 feet activity   Assist  Walk 10 feet activity did not occur: Safety/medical concerns (fatigue, generalized weakness, poor endurance)        Walk 50 feet activity   Assist Walk 50 feet with 2 turns activity did not occur: Safety/medical concerns (fatigue, generalized weakness, poor endurance)         Walk 150 feet activity   Assist Walk 150 feet activity did not occur: Safety/medical concerns (fatigue, generalized weakness, poor endurance)         Walk 10 feet on uneven surface  activity   Assist Walk 10 feet on uneven surfaces activity did not occur: Safety/medical concerns (fatigue, generalized weakness, poor endurance)         Wheelchair     Assist Will patient use wheelchair at discharge?: Yes Type of Wheelchair: Manual    Wheelchair assist level: Supervision/Verbal cueing Max wheelchair distance: 38ft    Wheelchair 50 feet with 2 turns activity    Assist        Assist Level: Supervision/Verbal cueing   Wheelchair 150 feet activity     Assist      Assist Level: Maximal Assistance - Patient 25 - 49%    Medical Problem List and Plan: 1.  Decreased functional mobility secondary to cellulitis left foot as well as right AKA revision 01/27/2020  Continue CIR  Team conference today to discuss current and goals and coordination of care, home and environmental barriers, and discharge planning with nursing, case manager,  and therapies. Please see conference note from today as well.  2.  Antithrombotics: -DVT/anticoagulation:Lovenox  on 30mg  BID, changed to daily              -antiplatelet therapy: Aspirin 81 mg daily 3. Pain Management: Tylenol prn per wife   Controlled on 2/2  Mon2/2r with increased exertion. 4. Mood: Provide emotional support  Fluoxetine started on 1/28 for depression-discussed with neuropsychology, appreciate recs.             -antipsychotic agents: N/A 5. Neuropsych: This patient is?  Fully capable of making decisions on his own behalf. 6. Skin/Wound decubitus to coccyx and R AKA.  Skin care as directed.  Routine skin checks- dressing to be changed BID and prn  Monitor wounds 7. Fluids/Electrolytes/Nutrition: Routine in and outs. 8.  ID/cellulitis left foot.  MRSA screen negative.  Blood cultures no growth to date.    Completed Augmentin on 02/05/2020 9.  Atrial flutter/A. fib  with RVR.  Toprol 25 mg daily.  Not a candidate for long-term DOAC.  30-day event monitor as outpatient.  Follow-up cardiology services  Controlled on 2/2, appreciate cards recs   Monitor with increased activity 10.  Hyponatremia.  Lasix and Aldactone resumed at lower doses.    Sodium 126 on 2/2  Continue sodium chloride tabs, resumed on 1/31 11.  Chronic diastolic congestive heart failure.    Lasix and Aldactone per cardiology  Followed by cardiology services  Monitor for any signs of fluid overload Filed Weights   02/08/20 0513 02/09/20 0554 02/11/20 0424  Weight: 51.7 kg 53.4 kg 53.4 kg   Stable on 2/2 12.  Chronic respiratory failure with hypoxia/COPD.  Chronic oxygen therapy 2 L.  Continue chronic prednisone. 13.  Chronic urinary retention.  Chronic Foley tube followed by urology service [name].    Continue Myrbetriq as well as Proscar.    Change Foley catheter to routinely- last changed 01/16/2020  Discussed with urology-plan to replace Foley 14.  GERD.  Protonix 15.  Chronic sacral decubitus.   Santyl ointment twice daily.   See #6 16.  Hypothyroidism: Synthroid 17.  Normocytic anemia.  Transfused 1 unit packed red blood cells 01/31/2020.    Hemoglobin 8.9 on 1/31 19.  Hx constipation improved now with cont stool 20.  Prediabetes  Monitor with increased mobility   LOS: 6 days A FACE TO FACE EVALUATION WAS PERFORMED  [name] Lorie Phenix 02/11/2020, 10:53 AM

## 2020-02-12 LAB — BASIC METABOLIC PANEL
Anion gap: 9 (ref 5–15)
BUN: 14 mg/dL (ref 8–23)
CO2: 30 mmol/L (ref 22–32)
Calcium: 8.8 mg/dL — ABNORMAL LOW (ref 8.9–10.3)
Chloride: 87 mmol/L — ABNORMAL LOW (ref 98–111)
Creatinine, Ser: 0.89 mg/dL (ref 0.61–1.24)
GFR, Estimated: >60 mL/min (ref >60)
Glucose, Bld: 90 mg/dL (ref 70–99)
Potassium: 4 mmol/L (ref 3.5–5.1)
Sodium: 126 mmol/L — ABNORMAL LOW (ref 135–145)

## 2020-02-12 MED ORDER — LEVALBUTEROL TARTRATE 45 MCG/ACT IN AERO: 2.0000 | INHALATION_SPRAY | Freq: Four times a day (QID) | RESPIRATORY_TRACT | Status: DC

## 2020-02-12 MED ORDER — LIDOCAINE HCL URETHRAL/MUCOSAL 2 % EX GEL
1.0000 "application " | Freq: Once | CUTANEOUS | Status: AC
  Administered 2020-02-12: 1 via URETHRAL
  Filled 2020-02-12: qty 10

## 2020-02-12 MED ORDER — LEVALBUTEROL TARTRATE 45 MCG/ACT IN AERO
2.0000 | INHALATION_SPRAY | Freq: Four times a day (QID) | RESPIRATORY_TRACT | Status: DC | PRN
  Filled 2020-02-12: qty 15

## 2020-02-12 MED ORDER — FUROSEMIDE 10 MG/ML IJ SOLN: 40.0000 mg | Freq: Two times a day (BID) | INTRAMUSCULAR | Status: DC

## 2020-02-12 NOTE — Progress Notes (Signed)
[facility] ([facility]) Community Based Palliative Care       This patient is enrolled in our palliative care services in the community.  [facility] will continue to follow for any discharge planning needs and to coordinate continuation of palliative care.       Thank you for the opportunity to participate in this patient's care.     Chrislyn King, BSN, RN [facility] Hospital Liaison   [phone] (24 h on call)  

## 2020-02-12 NOTE — Progress Notes (Signed)
Physical Therapy Session Note  Patient Details  Name: [name] MRN: [id number] Date of Birth: July 15, 1929  Today's Date: 02/12/2020 PT Individual Time: 7628-3151 PT Individual Time Calculation (min): 53 min   Short Term Goals: Week 1:  PT Short Term Goal 1 (Week 1): STG=LTG due to LOS  Skilled Therapeutic Interventions/Progress Updates:   Received pt supine in bed with wife present at bedside reporting pt was waiting for a new catheter. Consulted with RN who reported new catheter is being ordered but pt ok to participate in therapy. Pt reported fatigue and c/o "difficulty focusing" throughout session. Extensive discussion had regarding equipment and home set up. Therapist notified CSW of recommendation for hospital bed and 16x16 manual WC with L elevating legrest as pt's wife reports they have 2 transport chairs and an 18x18 manual WC without removable armrests or elevating legrest. 16x16 WC will allow for improved access to bathroom. [name] expressed that she is not able to assist pt and he needs to be able to transfer without assist. Reiterated that pt currently requires CGA/supervision for transfers and strongly encouraged pt's wife to try practicing with hands on transfers during this session. Pt initially declined OOB mobility stating "I can't do anything until I have a catheter." Educated pt on temporary use of brief and pt finally agreed to practice transfers with maximal encouragement from therapist and wife. Pt rolled L and R with supervision and required max A to don brief. Pt declined putting on pants, feeling "too exhausted." Pt transferred semi-reclined<>sitting EOB with CGA and sat EOB resting for ~7 minutes prior to transferring to [facility] via lateral scoot with supervision provided by pt's wife. [name] verbalized confidence with equipment management, transfer set up, and body positioning during transfer. Discussed need to practice car transfer, however pt politely declined due to  fatigue but agreed to practice tomorrow. Concluded session with pt sitting in WC, needs within reach, and seatbelt alarm on.   Therapy Documentation Precautions:  Precautions Precautions: Fall Precaution Comments: H/o R AKA (s/p revision 1/18) Restrictions Weight Bearing Restrictions: Yes RLE Weight Bearing: Non weight bearing  Therapy/Group: Individual Therapy [name] PT, DPT   02/12/2020, 7:27 AM

## 2020-02-12 NOTE — Progress Notes (Signed)
Physical Therapy Discharge Summary  Patient Details  Name: [name] MRN: [id number] Date of Birth: 1929-04-24  Patient has met 5 of 7 long term goals due to improved activity tolerance, improved balance and improved coordination.  Patient to discharge at a wheelchair level Supervision. Patient's care partner is independent to provide the necessary physical assistance at discharge. [name] has verbalized and demonstrated confidence with basic lateral scoot/squat<>pivot transfers and demonstrates good awareness of equipment management, transfer set up, and body positioning. [name] has also participated in hands on training with car transfers.   Reasons goals not met: Pt did not meet sit<>stand goal of CGA, as pt continues to require min A due to generalized weakness, decreased balance/postural control, and fear of falling. Pt also did not meet WC mobility goal of 178ft with supervision as pt is only able to propel ~64ft with supervision due to fatigue, generalized weakness, and poor endurance.   Recommendation:  Patient will benefit from ongoing skilled PT services in home health setting to continue to advance safe functional mobility, address ongoing impairments in transfers, generalized strengthening, dynamic standing balance/coordination, endurance, and to minimize fall risk.  Equipment: hospital bed, 16x16 manual WC with L elevating legrest  Reasons for discharge: treatment goals met and discharge from hospital  Patient/family agrees with progress made and goals achieved: Yes  PT Discharge Precautions/Restrictions Precautions Precautions: Fall Restrictions Weight Bearing Restrictions: Yes RLE Weight Bearing: Non weight bearing  Cognition Overall Cognitive Status: Within Functional Limits for tasks assessed Arousal/Alertness: Awake/alert Orientation Level: Oriented X4 Memory: Appears intact Awareness: Appears intact Problem Solving: Appears intact Safety/Judgment: Appears  intact Comments: pt is fearful of falling Sensation Sensation Light Touch: Appears Intact Proprioception: Impaired by gross assessment Coordination Fine Motor Movements are Fluid and Coordinated: Yes Coordination and Movement Description: grossly uncoordinated due to R AKA, decreased balance/postural control, generalized weakness, poor endurance, and fatigue Finger Nose Finger Test: [facility] but slow Heel Shin Test: WFL but slow on LLE, unable to perform on RLE due to AKA Motor  Motor Motor: Abnormal postural alignment and control Motor - Skilled Clinical Observations: grossly uncoordinated due to R AKA, decreased balance/postural control, generalized weakness, poor activity tolerance, and fatigue  Mobility Bed Mobility Bed Mobility: Rolling Right;Rolling Left;Sit to Supine;Supine to Sit Rolling Right: Supervision/verbal cueing Rolling Left: Supervision/Verbal cueing Supine to Sit: Supervision/Verbal cueing Sit to Supine: Supervision/Verbal cueing Transfers Transfers: Sit to Stand;Stand to Sit;Lateral/Scoot Transfers;Squat Pivot Transfers Sit to Stand: Minimal Assistance - Patient > 75% Stand to Sit: Contact Guard/Touching assist Squat Pivot Transfers: Contact Guard/Touching assist Lateral/Scoot Transfers: Supervision/Verbal cueing Transfer (Assistive device): Rolling walker Locomotion  Gait Ambulation: No Gait Gait: No Stairs / Additional Locomotion Stairs: No Wheelchair Mobility Wheelchair Mobility: Yes Wheelchair Assistance: Chartered loss adjuster: Both upper extremities Wheelchair Parts Management: Needs assistance Distance: 25ft  Trunk/Postural Assessment  Cervical Assessment Cervical Assessment: Exceptions to [facility] (forward head) Thoracic Assessment Thoracic Assessment: Exceptions to [facility] (rounded shoulders) Lumbar Assessment Lumbar Assessment: Exceptions to [facility] (posterior pelvic tilt) Postural Control Postural Control: Deficits on evaluation   Balance Balance Balance Assessed: Yes Static Sitting Balance Static Sitting - Balance Support: Feet supported;Bilateral upper extremity supported Static Sitting - Level of Assistance: 6: Modified independent (Device/Increase time) Dynamic Sitting Balance Dynamic Sitting - Balance Support: Feet supported;Bilateral upper extremity supported Dynamic Sitting - Level of Assistance: 5: Stand by assistance (supervision) Static Standing Balance Static Standing - Balance Support: Bilateral upper extremity supported (RW) Static Standing - Level of Assistance: 5: Stand by assistance (  CGA) Dynamic Standing Balance Dynamic Standing - Balance Support: Bilateral upper extremity supported (RW) Dynamic Standing - Level of Assistance: 5: Stand by assistance (CGA) Extremity Assessment  RLE Assessment RLE Assessment: Exceptions to [facility] General Strength Comments: grossly generalized to 3+/5 (hip flexion, abduction, adduction) LLE Assessment LLE Assessment: Exceptions to [facility] General Strength Comments: grossly generalized to 4/5  Alfonse Alpers PT, DPT  02/12/2020, 7:45 AM

## 2020-02-12 NOTE — Plan of Care (Signed)
  Problem: RH Ambulation Goal: LTG Patient will ambulate in controlled environment (PT) Description: LTG: Patient will ambulate in a controlled environment, # of feet with assistance (PT). Outcome: Not Applicable Flowsheets (Taken 02/12/2020 0737) LTG: Pt will ambulate in controlled environ  assist needed:: (D/C) -- Note: D/C Goal: LTG Patient will ambulate in home environment (PT) Description: LTG: Patient will ambulate in home environment, # of feet with assistance (PT). Outcome: Not Applicable Flowsheets (Taken 02/12/2020 0737) LTG: Pt will ambulate in home environ  assist needed:: (D/C) -- Note: D/C   Problem: RH Wheelchair Mobility Goal: LTG Patient will propel w/c in controlled environment (PT) Description: LTG: Patient will propel wheelchair in controlled environment, # of feet with assist (PT) Flowsheets (Taken 02/12/2020 0737) LTG: Pt will propel w/c in controlled environ  assist needed:: (downgraded due to fatigue, generalized weakness, and poor endurance) Supervision/Verbal cueing LTG: Propel w/c distance in controlled environment: 126ft Note: downgraded due to fatigue, generalized weakness, and poor endurance  Goal: LTG Patient will propel w/c in home environment (PT) Description: LTG: Patient will propel wheelchair in home environment, # of feet with assistance (PT). Flowsheets (Taken 02/12/2020 0737) LTG: Pt will propel w/c in home environ  assist needed:: (downgraded due to fatigue, generalized weakness, and poor endurance) Supervision/Verbal cueing LTG: Propel w/c distance in home environment: 57ft Note: downgraded due to fatigue, generalized weakness, and poor endurance

## 2020-02-12 NOTE — Progress Notes (Signed)
Progress Note  Patient Name: [name] Date of Encounter: 02/12/2020  [facility] Cardiologist: Sinclair Grooms, MD   Subjective   Feeling tired.  He is always groggy in the morning.  He also has some abdominal discomfort that improved somewhat after his bowel movement.  He feels bloated and distended.  He is unsure about what position to hold his leg in.  If he lowers it then his leg swells.  If he elevates it that he does not get good perfusion.  He has no chest pain but his breathing is a little labored.    Inpatient Medications    Scheduled Meds: . (feeding supplement) PROSource Plus  30 mL Oral BID BM  . aspirin EC  81 mg Oral Daily  . atorvastatin  40 mg Oral QHS  . budesonide-formoterol  2 puff Inhalation BID  . Chlorhexidine Gluconate Cloth  6 each Topical Daily  . cholecalciferol  2,000 Units Oral Q lunch  . enoxaparin (LOVENOX) injection  40 mg Subcutaneous Q24H  . finasteride  5 mg Oral Q lunch  . FLUoxetine  10 mg Oral Daily  . fluticasone  2 spray Each Nare Daily  . furosemide  40 mg Intravenous BID  . guaiFENesin  600 mg Oral BID  . levothyroxine  50 mcg Oral Daily  . metoprolol succinate  25 mg Oral Daily  . mirabegron ER  25 mg Oral Daily  . multivitamin with minerals  1 tablet Oral Daily  . pantoprazole  40 mg Oral Daily  . predniSONE  10 mg Oral Daily  . saccharomyces boulardii  250 mg Oral BID  . sodium chloride  1 g Oral BID WC  . spironolactone  12.5 mg Oral Daily  . Tiotropium Bromide Monohydrate  2 puff Inhalation Daily  . vitamin B-12  1,000 mcg Oral Daily   Continuous Infusions:  PRN Meds: acetaminophen, albuterol, ALPRAZolam, bisacodyl, calcium carbonate, HYDROcodone-acetaminophen, nitroGLYCERIN, polyethylene glycol, polyvinyl alcohol, senna-docusate   Vital Signs    Vitals:   02/11/20 2006 02/11/20 2024 02/12/20 0011 02/12/20 0418  BP: (!) 112/57   (!) 116/57  Pulse: 80 74 76 75  Resp: 16 17 15 17   Temp: 97.7 F (36.5 C)   98.1  F (36.7 C)  TempSrc:      SpO2: 100% 100% 100% 99%  Weight:      Height:        Intake/Output Summary (Last 24 hours) at 02/12/2020 0947 Last data filed at 02/12/2020 0251 Gross per 24 hour  Intake 480 ml  Output 1900 ml  Net -1420 ml   Last 3 Weights 02/11/2020 02/09/2020 02/08/2020  Weight (lbs) 117 lb 11.6 oz 117 lb 11.6 oz 113 lb 15.7 oz  Weight (kg) 53.4 kg 53.4 kg 51.7 kg      Telemetry    n/a - Personally Reviewed  ECG    n/a - Personally Reviewed  Physical Exam   VS:  BP (!) 116/57 (BP Location: Left Arm)   Pulse 75   Temp 98.1 F (36.7 C)   Resp 17   Ht 5\' 8"  (1.727 m)   Wt 53.4 kg   SpO2 99%   BMI 17.90 kg/m  , BMI Body mass index is 17.9 kg/m. GENERAL:  Frail.  Chronically ill-appearing HEENT: Pupils equal round and reactive, fundi not visualized, oral mucosa unremarkable NECK:  No jugular venous distention, waveform within normal limits, carotid upstroke brisk and symmetric, no bruits LUNGS: CTAB. HEART:  RRR.  PMI not displaced or sustained,S1 and S2 within normal limits, no S3, no S4, no clicks, no rubs, no murmurs ABD:  Flat, positive bowel sounds normal in frequency in pitch, no bruits, no rebound, no guarding, no midline pulsatile mass, no hepatomegaly, no splenomegaly EXT: , 1+ L LE edema, no cyanosis no clubbing SKIN:  No rashes no nodules NEURO:  Cranial nerves II through XII grossly intact, motor grossly intact throughout [facility]:  Cognitively intact, oriented to person place and time   Labs    High Sensitivity Troponin:   Recent Labs  Lab 01/26/20 1406 01/26/20 1536  TROPONINIHS 107* 115*      Chemistry Recent Labs  Lab 02/06/20 0805 02/07/20 0619 02/10/20 0646 02/11/20 0921 02/12/20 0505  NA 130*   < > 124* 126* 126*  K 4.2   < > 4.0 3.8 4.0  CL 91*   < > 89* 88* 87*  CO2 30   < > 30 31 30   GLUCOSE 120*   < > 89 132* 90  BUN 16   < > 13 12 14   CREATININE 0.94   < > 0.76 0.90 0.89  CALCIUM 9.2   < > 8.8* 8.7* 8.8*  PROT 5.2*   --   --   --   --   ALBUMIN 3.1*  --   --   --   --   AST 28  --   --   --   --   ALT 28  --   --   --   --   ALKPHOS 55  --   --   --   --   BILITOT 1.1  --   --   --   --   GFRNONAA >60   < > >60 >60 >60  ANIONGAP 9   < > 5 7 9    < > = values in this interval not displayed.     Hematology Recent Labs  Lab 02/06/20 0805 02/08/20 0502 02/09/20 0607  WBC 7.7 7.3 7.2  RBC 2.90* 2.69* 2.85*  HGB 9.5* 8.5* 8.9*  HCT 28.0* 26.1* 27.1*  MCV 96.6 97.0 95.1  MCH 32.8 31.6 31.2  MCHC 33.9 32.6 32.8  RDW 13.6 13.9 13.7  PLT 292 263 290    BNP Recent Labs  Lab 02/10/20 0646  BNP 334.1*     DDimer No results for input(s): DDIMER in the last 168 hours.   Radiology    No results found.  Cardiac Studies   ECHO 01/28/2020  1. Left ventricular ejection fraction, by estimation, is 60 to 65%. The  left ventricle has normal function. The left ventricle has no regional  wall motion abnormalities. There is mild concentric left ventricular  hypertrophy. Left ventricular diastolic  parameters are indeterminate.  2. Right ventricular systolic function is normal. The right ventricular  size is normal.  3. The mitral valve is normal in structure. No evidence of mitral valve  regurgitation. No evidence of mitral stenosis.  4. The aortic valve is tricuspid. There is mild calcification of the  aortic valve. There is mild thickening of the aortic valve. Aortic valve  regurgitation is not visualized. Mild aortic valve sclerosis is present,  with no evidence of aortic valve  stenosis.  5. The inferior vena cava is normal in size with greater than 50%  respiratory variability, suggesting right atrial pressure of 3 mmHg.   NM Myoview 2019: IMPRESSION: 1. Large infarct involving the inferior wall including the inferior apex.  Difficult to exclude peri-infarct ischemia at the apex and recommend correlation with ECG testing.  2. Abnormal wall motion along the inferior wall base  and septal wall as described.  3. Left ventricular ejection fraction is 48%.  4. Non invasive risk stratification*: Intermediate  Patient Profile     87 y.o. male with CAD s/p CABG in [id number], chronic diastolic heart failure, advanced PAD, COPD on home O2, hypertension admitted for right AKA 01/27/2020.  He developed postoperative atrial fibrillation/flutter on 1/24, which was a new finding.  Assessment & Plan    # Atrial fibrillation/flutter: He had an 8 hour episode of atrial flutter alternating with atrial fibrillation.  He converted to sinus rhythm spontaneously.  His beta blocker was increased.  Plan for 30 day event monitor on discharge from CIR. No plan for long term anticoagulation any unless he has recurrence.  # CAD: CABG in 2003.  He had a Myoview in 2019 with inferior infarct with peri-infarct ischemia.  Continue aspirin and atorvastatin.  # PAD: Status post right AKA this admission.  He was offered left foot amputation but declined.  Continue aspirin and atorvastatin.  Continue metoprolol.  # Acute on chronic diastolic heart failure: Volume status is challenging to assess.  He previously developed edema after stopping diuretics.  Furosemide and spironolactone were resumed at lower doses.  BNP was elevated.  His edema is improving with diuresis.  Renal function and sodium are stable.  He reports increased shortness of breath and abdominal distention today.  We will increase furosemide to 40 mg twice daily today.  I suspect he will be ready to switch to oral tomorrow.  Continue metoprolol and spironolactone.      For questions or updates, please contact [place] Please consult [email or web address] for contact info under        Signed, Skeet Latch, MD  02/12/2020, 9:47 AM

## 2020-02-12 NOTE — Progress Notes (Signed)
Occupational Therapy Note  Patient Details  Name: [name] MRN: [id number] Date of Birth: February 15, 1929  Today's Date: 02/12/2020 OT Missed Time: 2 Minutes Missed Time Reason: Pain  Pt asleep upon OT arrival.  Pt reports he recently had catheter changed and feels sore from this.  Pt wants to stay still at the moment and requesting to defer OT session at this time. Nurse made aware.     Ezekiel Slocumb 02/12/2020, 5:45 PM

## 2020-02-12 NOTE — Progress Notes (Signed)
Patient ID: [name], male   DOB: Mar 15, 1929, 87 y.o.   MRN: [id number]  SW ordered w/c and hospital bed with [place] via parachute.   SW met with pt and pt wife in room and informed on above.   Loralee Pacas, MSW, [place] Office: [phone] Cell: [phone] Fax: [phone]

## 2020-02-12 NOTE — Progress Notes (Signed)
[place] PHYSICAL MEDICINE & REHABILITATION PROGRESS NOTE  Subjective/Complaints: Patient seen sitting up in bed this morning eating breakfast.  He states he slept better overnight.  He was seen by cardiology yesterday, notes reviewed-assessing frequency of Lasix.  He has questions regarding stump edema again.  ROS: + Baseline shortness of breath, stable.  Denies N/V/D  Objective: Vital Signs: Blood pressure (!) 116/57, pulse 75, temperature 98.1 F (36.7 C), resp. rate 17, height 5\' 8"  (1.727 m), weight 53.4 kg, SpO2 99 %. No results found. No results for input(s): WBC, HGB, HCT, PLT in the last 72 hours. Recent Labs    02/11/20 0921 02/12/20 0505  NA 126* 126*  K 3.8 4.0  CL 88* 87*  CO2 31 30  GLUCOSE 132* 90  BUN 12 14  CREATININE 0.90 0.89  CALCIUM 8.7* 8.8*    Intake/Output Summary (Last 24 hours) at 02/12/2020 1125 Last data filed at 02/12/2020 0730 Gross per 24 hour  Intake 720 ml  Output 1900 ml  Net -1180 ml     Pressure Injury 01/24/20 Coccyx Mid;Lower Stage 2 -  Partial thickness loss of dermis presenting as a shallow open injury with a red, pink wound bed without slough. (Active)  01/24/20 1445  Location: Coccyx  Location Orientation: Mid;Lower  Staging: Stage 2 -  Partial thickness loss of dermis presenting as a shallow open injury with a red, pink wound bed without slough.  Wound Description (Comments):   Present on Admission: Yes     Pressure Injury 02/09/20 Buttocks Left Stage 2 -  Partial thickness loss of dermis presenting as a shallow open injury with a red, pink wound bed without slough. Above previous wound (Active)  02/09/20 1459  Location: Buttocks  Location Orientation: Left  Staging: Stage 2 -  Partial thickness loss of dermis presenting as a shallow open injury with a red, pink wound bed without slough.  Wound Description (Comments): Above previous wound  Present on Admission: Yes    Physical Exam: BP (!) 116/57 (BP Location: Left Arm)    Pulse 75   Temp 98.1 F (36.7 C)   Resp 17   Ht 5\' 8"  (1.727 m)   Wt 53.4 kg   SpO2 99%   BMI 17.90 kg/m  Constitutional: No distress . Vital signs reviewed. HENT: Normocephalic.  Atraumatic. Eyes: EOMI. No discharge. Cardiovascular: No JVD.  RRR. Respiratory: Normal effort.  No stridor.  Bilateral clear to auscultation.  + [place]. GI: Non-distended.  BS +. GU: + Foley. Skin: Warm and dry.   Right AKA with sutures CDI Buttock/coccyx ulcers not examined today. Psych: Normal mood.  Normal behavior. Musc:  Right AKA with edema and tenderness, stable Left lower extremity with significant edema, stable. Neuro: Alert Very HOH Motor: Bilateral upper extremities: 5/5 proximal distal Right lower extremity: Hip flexion 3+/5 (pain inhibition), unchanged Left lower extremity: Hip flexion, knee extension, ankle dorsiflexion 4 -/5, unchanged  Assessment/Plan: 1. Functional deficits which require 3+ hours per day of interdisciplinary therapy in a comprehensive inpatient rehab setting.  Physiatrist is providing close team supervision and 24 hour management of active medical problems listed below.  Physiatrist and rehab team continue to assess barriers to discharge/monitor patient progress toward functional and medical goals   Care Tool:  Bathing    Body parts bathed by patient: Right arm,Left arm,Chest,Abdomen,Front perineal area,Left upper leg,Face   Body parts bathed by helper: Buttocks,Left lower leg Body parts n/a: Right upper leg,Right lower leg   Bathing assist Assist Level:  Moderate Assistance - Patient 50 - 74%     Upper Body Dressing/Undressing Upper body dressing   What is the patient wearing?: Pull over shirt    Upper body assist Assist Level: Supervision/Verbal cueing    Lower Body Dressing/Undressing Lower body dressing      What is the patient wearing?: Pants     Lower body assist Assist for lower body dressing: Minimal Assistance - Patient > 75%      Toileting Toileting    Toileting assist Assist for toileting: 2 Helpers     Transfers Chair/bed transfer  Transfers assist  Chair/bed transfer activity did not occur: Refused  Chair/bed transfer assist level: Contact Guard/Touching assist     Locomotion Ambulation   Ambulation assist      Assist level: 2 helpers Assistive device: Parallel bars Max distance: 87ft   Walk 10 feet activity   Assist  Walk 10 feet activity did not occur: Safety/medical concerns (fatigue, generalized weakness, poor endurance)        Walk 50 feet activity   Assist Walk 50 feet with 2 turns activity did not occur: Safety/medical concerns (fatigue, generalized weakness, poor endurance)         Walk 150 feet activity   Assist Walk 150 feet activity did not occur: Safety/medical concerns (fatigue, generalized weakness, poor endurance)         Walk 10 feet on uneven surface  activity   Assist Walk 10 feet on uneven surfaces activity did not occur: Safety/medical concerns (fatigue, generalized weakness, poor endurance)         Wheelchair     Assist Will patient use wheelchair at discharge?: Yes Type of Wheelchair: Manual    Wheelchair assist level: Supervision/Verbal cueing Max wheelchair distance: 59ft    Wheelchair 50 feet with 2 turns activity    Assist        Assist Level: Supervision/Verbal cueing   Wheelchair 150 feet activity     Assist      Assist Level: Maximal Assistance - Patient 25 - 49%    Medical Problem List and Plan: 1.  Decreased functional mobility secondary to cellulitis left foot as well as right AKA revision 01/27/2020  Continue CIR 2.  Antithrombotics: -DVT/anticoagulation:Lovenox  on 30mg  BID, changed to daily              -antiplatelet therapy: Aspirin 81 mg daily 3. Pain Management: Tylenol prn per wife   Controlled on 2/3  Mon2/2r with increased exertion. 4. Mood: Provide emotional support  Fluoxetine started on  1/28 for depression-discussed with neuropsychology, appreciate recs.             -antipsychotic agents: N/A 5. Neuropsych: This patient is?  Fully capable of making decisions on his own behalf. 6. Skin/Wound decubitus to coccyx and R AKA.  Skin care as directed.  Routine skin checks- dressing to be changed BID and prn  Monitor wounds 7. Fluids/Electrolytes/Nutrition: Routine in and outs. 8.  ID/cellulitis left foot.  MRSA screen negative.  Blood cultures no growth to date.    Completed Augmentin on 02/05/2020 9.  Atrial flutter/A. fib with RVR.  Toprol 25 mg daily.  Not a candidate for long-term DOAC.  30-day event monitor as outpatient.  Follow-up cardiology services  Controlled on 2/3, appreciate cards recs   Monitor with increased activity 10.  Hyponatremia.  Lasix and Aldactone resumed at lower doses.    Sodium 126 on 2/3  Continue sodium chloride tabs, resumed on 1/31 11.  Chronic  diastolic congestive heart failure.    Lasix and Aldactone per cardiology-appreciate recs  Followed by cardiology services  Monitor for any signs of fluid overload Filed Weights   02/08/20 0513 02/09/20 0554 02/11/20 0424  Weight: 51.7 kg 53.4 kg 53.4 kg   Stable on 2/2, no weights for today 12.  Chronic respiratory failure with hypoxia/COPD.  Chronic oxygen therapy 2 L.  Continue chronic prednisone. 13.  Chronic urinary retention.  Chronic Foley tube followed by urology service [name].    Continue Myrbetriq as well as Proscar.    Change Foley catheter to routinely- last changed 01/16/2020  Discussed with urology-plan to replace Foley on 2/7, however wife would like to exchange today-we will proceed 14.  GERD.  Protonix 15.  Chronic sacral decubitus.  Santyl ointment twice daily.   See #6 16.  Hypothyroidism: Synthroid 17.  Normocytic anemia.  Transfused 1 unit packed red blood cells 01/31/2020.    Hemoglobin 8.9 on 1/31, labs ordered for tomorrow 19.  Hx constipation improved now with cont stool 20.   Prediabetes  Controlled on 2/3  Monitor with increased mobility   LOS: 7 days A FACE TO FACE EVALUATION WAS PERFORMED  Ginnifer Creelman Lorie Phenix 02/12/2020, 11:25 AM

## 2020-02-13 LAB — CBC
HCT: 27.9 % — ABNORMAL LOW (ref 39.0–52.0)
Hemoglobin: 8.9 g/dL — ABNORMAL LOW (ref 13.0–17.0)
MCH: 31.2 pg (ref 26.0–34.0)
MCHC: 31.9 g/dL (ref 30.0–36.0)
MCV: 97.9 fL (ref 80.0–100.0)
Platelets: 307 10*3/uL (ref 150–400)
RBC: 2.85 MIL/uL — ABNORMAL LOW (ref 4.22–5.81)
RDW: 13.7 % (ref 11.5–15.5)
WBC: 8.2 10*3/uL (ref 4.0–10.5)
nRBC: 0 % (ref 0.0–0.2)

## 2020-02-13 LAB — BASIC METABOLIC PANEL
Anion gap: 7 (ref 5–15)
BUN: 15 mg/dL (ref 8–23)
CO2: 31 mmol/L (ref 22–32)
Calcium: 9.1 mg/dL (ref 8.9–10.3)
Chloride: 87 mmol/L — ABNORMAL LOW (ref 98–111)
Creatinine, Ser: 0.9 mg/dL (ref 0.61–1.24)
GFR, Estimated: >60 mL/min (ref >60)
Glucose, Bld: 87 mg/dL (ref 70–99)
Potassium: 4 mmol/L (ref 3.5–5.1)
Sodium: 125 mmol/L — ABNORMAL LOW (ref 135–145)

## 2020-02-13 LAB — TROPONIN I (HIGH SENSITIVITY)
Troponin I (High Sensitivity): 34 ng/L — ABNORMAL HIGH (ref <18)
Troponin I (High Sensitivity): 29 ng/L — ABNORMAL HIGH (ref <18)

## 2020-02-13 MED ORDER — FUROSEMIDE 10 MG/ML IJ SOLN
40.0000 mg | Freq: Every day | INTRAMUSCULAR | Status: DC
  Administered 2020-02-13 – 2020-02-14 (×2): 40 mg via INTRAVENOUS
  Filled 2020-02-13 (×2): qty 4

## 2020-02-13 NOTE — Progress Notes (Addendum)
Progress Note  Patient Name: [name] Date of Encounter: 02/13/2020  [facility] Cardiologist: Sinclair Grooms, MD   Subjective   Feeling tired.  Also a little light headeded.  Happy that his foley was changed.  He is feeling a little short of breath, but he attributes this to having his breakfast before having his inhalers.  He also reports some chest discomfort that he thinks is indigestion.  He has a lot of belching and his abdomen is distended.  He had 3 bowel movements yesterday and wonders if he can have something to slow it down.  Inpatient Medications    Scheduled Meds: . (feeding supplement) PROSource Plus  30 mL Oral BID BM  . aspirin EC  81 mg Oral Daily  . atorvastatin  40 mg Oral QHS  . budesonide-formoterol  2 puff Inhalation BID  . Chlorhexidine Gluconate Cloth  6 each Topical Daily  . cholecalciferol  2,000 Units Oral Q lunch  . enoxaparin (LOVENOX) injection  40 mg Subcutaneous Q24H  . finasteride  5 mg Oral Q lunch  . FLUoxetine  10 mg Oral Daily  . fluticasone  2 spray Each Nare Daily  . furosemide  40 mg Intravenous BID  . guaiFENesin  600 mg Oral BID  . levothyroxine  50 mcg Oral Daily  . metoprolol succinate  25 mg Oral Daily  . mirabegron ER  25 mg Oral Daily  . multivitamin with minerals  1 tablet Oral Daily  . pantoprazole  40 mg Oral Daily  . predniSONE  10 mg Oral Daily  . saccharomyces boulardii  250 mg Oral BID  . sodium chloride  1 g Oral BID WC  . spironolactone  12.5 mg Oral Daily  . Tiotropium Bromide Monohydrate  2 puff Inhalation Daily  . vitamin B-12  1,000 mcg Oral Daily   Continuous Infusions:  PRN Meds: acetaminophen, ALPRAZolam, bisacodyl, calcium carbonate, HYDROcodone-acetaminophen, levalbuterol, nitroGLYCERIN, polyethylene glycol, polyvinyl alcohol, senna-docusate   Vital Signs    Vitals:   02/12/20 0418 02/12/20 1315 02/12/20 2045 02/13/20 0516  BP: (!) 116/57 (!) 93/48  (!) 103/55  Pulse: 75 76  74  Resp: 17 18   18   Temp: 98.1 F (36.7 C) 98.2 F (36.8 C)  98 F (36.7 C)  TempSrc:      SpO2: 99% 100% 97% 99%  Weight:    53.5 kg  Height:        Intake/Output Summary (Last 24 hours) at 02/13/2020 0851 Last data filed at 02/13/2020 0500 Gross per 24 hour  Intake 480 ml  Output 1900 ml  Net -1420 ml   Last 3 Weights 02/13/2020 02/11/2020 02/09/2020  Weight (lbs) 117 lb 15.1 oz 117 lb 11.6 oz 117 lb 11.6 oz  Weight (kg) 53.5 kg 53.4 kg 53.4 kg      Telemetry    n/a - Personally Reviewed  ECG    n/a - Personally Reviewed  Physical Exam   VS:  BP (!) 103/55 (BP Location: Left Arm)   Pulse 74   Temp 98 F (36.7 C)   Resp 18   Ht 5\' 8"  (1.727 m)   Wt 53.5 kg   SpO2 99%   BMI 17.93 kg/m  , BMI Body mass index is 17.93 kg/m. GENERAL:  Frail.  Chronically ill-appearing HEENT: Pupils equal round and reactive, fundi not visualized, oral mucosa unremarkable NECK:  No jugular venous distention, waveform within normal limits, carotid upstroke brisk and symmetric, no bruits LUNGS:  CTAB. HEART:  RRR.  PMI not displaced or sustained,S1 and S2 within normal limits, no S3, no S4, no clicks, no rubs, no murmurs ABD:  Mildly distended.  Non-tender.  Positive bowel sounds normal in frequency in pitch, no bruits, no rebound, no guarding, no midline pulsatile mass, no hepatomegaly, no splenomegaly EXT: , 1+ L LE edema, no cyanosis no clubbing SKIN:  No rashes no nodules NEURO:  Cranial nerves II through XII grossly intact, motor grossly intact throughout PSYCH:  Cognitively intact, oriented to person place and time   Labs    High Sensitivity Troponin:   Recent Labs  Lab 01/26/20 1406 01/26/20 1536  TROPONINIHS 107* 115*      Chemistry Recent Labs  Lab 02/11/20 0921 02/12/20 0505 02/13/20 0550  NA 126* 126* 125*  K 3.8 4.0 4.0  CL 88* 87* 87*  CO2 31 30 31   GLUCOSE 132* 90 87  BUN 12 14 15   CREATININE 0.90 0.89 0.90  CALCIUM 8.7* 8.8* 9.1  GFRNONAA >60 >60 >60  ANIONGAP 7 9 7       Hematology Recent Labs  Lab 02/08/20 0502 02/09/20 0607  WBC 7.3 7.2  RBC 2.69* 2.85*  HGB 8.5* 8.9*  HCT 26.1* 27.1*  MCV 97.0 95.1  MCH 31.6 31.2  MCHC 32.6 32.8  RDW 13.9 13.7  PLT 263 290    BNP Recent Labs  Lab 02/10/20 0646  BNP 334.1*     DDimer No results for input(s): DDIMER in the last 168 hours.   Radiology    No results found.  Cardiac Studies   ECHO 01/28/2020  1. Left ventricular ejection fraction, by estimation, is 60 to 65%. The  left ventricle has normal function. The left ventricle has no regional  wall motion abnormalities. There is mild concentric left ventricular  hypertrophy. Left ventricular diastolic  parameters are indeterminate.  2. Right ventricular systolic function is normal. The right ventricular  size is normal.  3. The mitral valve is normal in structure. No evidence of mitral valve  regurgitation. No evidence of mitral stenosis.  4. The aortic valve is tricuspid. There is mild calcification of the  aortic valve. There is mild thickening of the aortic valve. Aortic valve  regurgitation is not visualized. Mild aortic valve sclerosis is present,  with no evidence of aortic valve  stenosis.  5. The inferior vena cava is normal in size with greater than 50%  respiratory variability, suggesting right atrial pressure of 3 mmHg.   NM Myoview 2019: IMPRESSION: 1. Large infarct involving the inferior wall including the inferior apex. Difficult to exclude peri-infarct ischemia at the apex and recommend correlation with ECG testing.  2. Abnormal wall motion along the inferior wall base and septal wall as described.  3. Left ventricular ejection fraction is 48%.  4. Non invasive risk stratification*: Intermediate  Patient Profile     87 y.o. male with CAD s/p CABG in [id number], chronic diastolic heart failure, advanced PAD, COPD on home O2, hypertension admitted for right AKA 01/27/2020.  He developed postoperative atrial  fibrillation/flutter on 1/24, which was a new finding.  Assessment & Plan    # Atrial fibrillation/flutter: He had an 8 hour episode of atrial flutter alternating with atrial fibrillation.  He converted to sinus rhythm spontaneously.  His beta blocker was increased.  Plan for 30 day event monitor on discharge from CIR. Heart rates have been stable here.  No plan for long term anticoagulation any unless he has recurrence.   #  CAD: # Chest pain: CABG in 2003.  He had a Myoview in 2019 with inferior infarct with peri-infarct ischemia.  Continue aspirin and atorvastatin.  He currently reports chest pain that he thinks is indigestion related.  Will check EKG and hs-troponin.  # PAD: Status post right AKA this admission.  He was offered left foot amputation but declined.  Continue aspirin and atorvastatin.  Continue metoprolol.  # Acute on chronic diastolic heart failure: # Hypertension: # Hyponatremia: Volume status is challenging to assess.  He previously developed edema after stopping diuretics.  Furosemide and spironolactone were resumed at lower doses.  BNP was elevated.  His edema is improving with diuresis.  He has chronic hyponatremia and has been treated with salt tablets.  This likely worsens his edema though it was helpful when his Na was <120 in the past.  If sodium is less than 120 again, would consider a dose of tolvaptan. BP is a little low and he has lightheadedness.  Hold spironolactone and give lasix 40mg  IV.  Likely switch to oral tomorrow.       For questions or updates, please contact [facility] Please consult [email or web address] for contact info under        Signed, Skeet Latch, MD  02/13/2020, 8:51 AM

## 2020-02-13 NOTE — Progress Notes (Signed)
Occupational Therapy Session Note  Patient Details  Name: [name] MRN: [id number] Date of Birth: 06/24/1929  Today's Date: 02/13/2020 OT Individual Time: 1000-1100 OT Individual Time Calculation (min): 60 min    Short Term Goals: Week 1:  OT Short Term Goal 1 (Week 1): Pt will complete toilet transfers with mod assist OT Short Term Goal 2 (Week 1): Pt will complete LB dressing mod assist at sit > stand level OT Short Term Goal 3 (Week 1): Pt will complete toileting tasks with mod assist  Skilled Therapeutic Interventions/Progress Updates:    Pt supine in bed, wife at bedside, nursing just finishing up with pt.  Pt reports he feels very tired and doesn't think he should do anything due to recent chest pain.  Nurse made aware and per nurse, MD is aware, and pt is cleared for activity as tolerated.  OT encouraged pt through therapeutic use of self and pt agreeable to complete self care at EOB level due to this is how he completed at [facility].  Pt required significant increased time to complete all self care requiring intermittent RBs throughout due to fatigue.  Pt completed UB dressing with supervision, and UB bathing with min assist to fully wash back.  Pt also completed LB bathing with min assist for left foot.  Noted dressing on heel and pts wife reporting concern that it needs to be changed.  Notified nurse who came in and changed dressing at that time. Also noted left foot with red discoloration and moderate swelling; per pts wife, they are confused about whether or not he should wear TED hose on LLE; per nursing she will clarify with MD regarding TED hose orders. Pt completed LB dressing using lateral lean technique to donn underwear and pants with min assist to fully pull over hips.  Pt agreeable to sit up in w/c at end of session; due to time constraints pt left with nurse tech to assist with transfer.    Therapy Documentation Precautions:  Precautions Precautions: Fall Precaution Comments:  H/o R AKA (s/p revision 1/18) Restrictions Weight Bearing Restrictions: Yes RLE Weight Bearing: Non weight bearing   Therapy/Group: Individual Therapy  Ezekiel Slocumb 02/13/2020, 12:31 PM

## 2020-02-13 NOTE — Progress Notes (Signed)
[place] PHYSICAL MEDICINE & REHABILITATION PROGRESS NOTE  Subjective/Complaints: Patient seen sitting up in bed this morning.  Wife at bedside.  He states he slept better overnight.  Wife notes change in Lasix plans with EKG recently ordered due to complains of chest pressure.  He was seen by cardiology yesterday, notes reviewed-Lasix increased.  Foley replaced due to wife's preference.  ROS: + Baseline shortness of breath, unchanged.  Denies N/V/D  Objective: Vital Signs: Blood pressure (!) 103/55, pulse 74, temperature 98 F (36.7 C), resp. rate 18, height 5\' 8"  (1.727 m), weight 53.5 kg, SpO2 99 %. No results found. No results for input(s): WBC, HGB, HCT, PLT in the last 72 hours. Recent Labs    02/12/20 0505 02/13/20 0550  NA 126* 125*  K 4.0 4.0  CL 87* 87*  CO2 30 31  GLUCOSE 90 87  BUN 14 15  CREATININE 0.89 0.90  CALCIUM 8.8* 9.1    Intake/Output Summary (Last 24 hours) at 02/13/2020 1315 Last data filed at 02/13/2020 0855 Gross per 24 hour  Intake 720 ml  Output 900 ml  Net -180 ml     Pressure Injury 01/24/20 Coccyx Mid;Lower Stage 2 -  Partial thickness loss of dermis presenting as a shallow open injury with a red, pink wound bed without slough. (Active)  01/24/20 1445  Location: Coccyx  Location Orientation: Mid;Lower  Staging: Stage 2 -  Partial thickness loss of dermis presenting as a shallow open injury with a red, pink wound bed without slough.  Wound Description (Comments):   Present on Admission: Yes     Pressure Injury 02/06/20 Buttocks Left Stage 2 -  Partial thickness loss of dermis presenting as a shallow open injury with a red, pink wound bed without slough. Above previous wound (Active)  02/06/20 (noted by [facility] nurse on consult) 1459  Location: Buttocks  Location Orientation: Left  Staging: Stage 2 -  Partial thickness loss of dermis presenting as a shallow open injury with a red, pink wound bed without slough.  Wound Description (Comments):  Above previous wound  Present on Admission: Yes    Physical Exam: BP (!) 103/55 (BP Location: Left Arm)   Pulse 74   Temp 98 F (36.7 C)   Resp 18   Ht 5\' 8"  (1.727 m)   Wt 53.5 kg   SpO2 99%   BMI 17.93 kg/m  Constitutional: No distress . Vital signs reviewed. HENT: Normocephalic.  Atraumatic. Eyes: EOMI. No discharge. Cardiovascular: No JVD.  RRR. Respiratory: Normal effort.  No stridor.  Bilateral clear to auscultation.  + [place]. GI: Non-distended.  BS +. Skin: Warm and dry.   Right AKA with dressing CDI Scattered ecchymosis Buttocks/coccyx ulcers not examined today Psych: Normal mood.  Normal behavior. Musc:  Right AKA with edema and tenderness, unchanged Left lower extremity with significant edema, improving Neuro: Alert Very HOH Motor: Bilateral upper extremities: 5/5 proximal distal Right lower extremity: Hip flexion 3+/5 (pain inhibition), stable Left lower extremity: Hip flexion, knee extension, ankle dorsiflexion 4 -/5, unchanged  Assessment/Plan: 1. Functional deficits which require 3+ hours per day of interdisciplinary therapy in a comprehensive inpatient rehab setting.  Physiatrist is providing close team supervision and 24 hour management of active medical problems listed below.  Physiatrist and rehab team continue to assess barriers to discharge/monitor patient progress toward functional and medical goals   Care Tool:  Bathing    Body parts bathed by patient: Right arm,Left arm,Chest,Abdomen,Front perineal area,Buttocks,Left upper leg,Face   Body  parts bathed by helper: Left lower leg Body parts n/a: Right upper leg,Right lower leg   Bathing assist Assist Level: Minimal Assistance - Patient > 75%     Upper Body Dressing/Undressing Upper body dressing   What is the patient wearing?: Pull over shirt    Upper body assist Assist Level: Supervision/Verbal cueing    Lower Body Dressing/Undressing Lower body dressing      What is the patient  wearing?: Pants,Underwear/pull up     Lower body assist Assist for lower body dressing: Minimal Assistance - Patient > 75%     Toileting Toileting    Toileting assist Assist for toileting: 2 Helpers     Transfers Chair/bed transfer  Transfers assist  Chair/bed transfer activity did not occur: Refused  Chair/bed transfer assist level: Supervision/Verbal cueing     Locomotion Ambulation   Ambulation assist      Assist level: 2 helpers Assistive device: Parallel bars Max distance: 53ft   Walk 10 feet activity   Assist  Walk 10 feet activity did not occur: Safety/medical concerns (fatigue, generalized weakness, poor endurance)        Walk 50 feet activity   Assist Walk 50 feet with 2 turns activity did not occur: Safety/medical concerns (fatigue, generalized weakness, poor endurance)         Walk 150 feet activity   Assist Walk 150 feet activity did not occur: Safety/medical concerns (fatigue, generalized weakness, poor endurance)         Walk 10 feet on uneven surface  activity   Assist Walk 10 feet on uneven surfaces activity did not occur: Safety/medical concerns (fatigue, generalized weakness, poor endurance)         Wheelchair     Assist Will patient use wheelchair at discharge?: Yes Type of Wheelchair: Manual    Wheelchair assist level: Supervision/Verbal cueing Max wheelchair distance: 45ft    Wheelchair 50 feet with 2 turns activity    Assist        Assist Level: Supervision/Verbal cueing   Wheelchair 150 feet activity     Assist      Assist Level: Maximal Assistance - Patient 25 - 49%    Medical Problem List and Plan: 1.  Decreased functional mobility secondary to cellulitis left foot as well as right AKA revision 01/27/2020  Continue CIR 2.  Antithrombotics: -DVT/anticoagulation:Lovenox  on 30mg  BID, changed to daily              -antiplatelet therapy: Aspirin 81 mg daily 3. Pain Management: Tylenol  prn per wife   Controlled on 2/3  Mon2/2r with increased exertion. 4. Mood: Provide emotional support  Fluoxetine started on 1/28 for depression-discussed with neuropsychology, appreciate recs.             -antipsychotic agents: N/A 5. Neuropsych: This patient is?  Fully capable of making decisions on his own behalf. 6. Skin/Wound decubitus to coccyx and R AKA.  Skin care as directed.  Routine skin checks- dressing to be changed BID and prn  Monitor wounds 7. Fluids/Electrolytes/Nutrition: Routine in and outs. 8.  ID/cellulitis left foot.  MRSA screen negative.  Blood cultures no growth to date.    Completed Augmentin on 02/05/2020 9.  Atrial flutter/A. fib with RVR.  Toprol 25 mg daily.  Not a candidate for long-term DOAC.  30-day event monitor as outpatient.  Follow-up cardiology services  Controlled on 2/4, appreciate cards recs   Monitor with increased activity 10.  Hyponatremia.  Lasix and Aldactone resumed at  lower doses.    Sodium 125 on 2/4  Continue sodium chloride tabs, resumed on 1/31 11.  Chronic diastolic congestive heart failure.    Lasix and Aldactone per cardiology-appreciate recs  Followed by cardiology services  Monitor for any signs of fluid overload Filed Weights   02/09/20 0554 02/11/20 0424 02/13/20 0516  Weight: 53.4 kg 53.4 kg 53.5 kg   Stable on 2/2, no weights for today 12.  Chronic respiratory failure with hypoxia/COPD.  Chronic oxygen therapy 2 L.  Continue chronic prednisone. 13.  Chronic urinary retention.  Chronic Foley tube followed by urology service [name].    Continue Myrbetriq as well as Proscar.    Change Foley catheter to routinely- last changed 01/16/2020  Foley exchanged on 2/3 14.  GERD.  Protonix 15.  Chronic sacral decubitus.  Santyl ointment twice daily.   See #6 16.  Hypothyroidism: Synthroid 17.  Normocytic anemia.  Transfused 1 unit packed red blood cells 01/31/2020.    Hemoglobin 8.9 on 1/31, labs pending 19.  Hx constipation  improved now with cont stool 20.  Prediabetes  Controlled on 2/4  Monitor with increased mobility   LOS: 8 days A FACE TO FACE EVALUATION WAS PERFORMED  Gracielynn Birkel Lorie Phenix 02/13/2020, 1:15 PM

## 2020-02-13 NOTE — Progress Notes (Signed)
Physical Therapy Session Note  Patient Details  Name: [name] MRN: [id number] Date of Birth: 1929/02/13  Today's Date: 02/13/2020 PT Individual Time: 1446-1530 PT Individual Time Calculation (min): 44 min   Short Term Goals: Week 1:  PT Short Term Goal 1 (Week 1): STG=LTG due to LOS  Skilled Therapeutic Interventions/Progress Updates:    pt received in bed and agreeable to therapy. [name] reports pt has had "a very exhausting day" and pt requested not to leave room. Pt agreeable to attempting sitting EOB but denied to transfer to [facility]. Pt directed in supine>sit CGA, sat EOB CGA. Pt then agreeable to transferring to [facility]. WC setup by PT on pt's Lt side and squat pivot/lateral scoot transfer CGA with extra time to complete. Pt directed in seated BLE strengthening exercises, denying any weights of x10 overhead press, cheat press, and bicep curls, and RLE marching and LAQ. Pt requesting medication for anxiety, nursing present to give medication. Pt then left in WC at end of session, wife present, alarm set, All needs in reach and in good condition. Call light in hand.  Pt required frequent rest breaks during session 2/2 fatigue and shortness of breath. On 2L O2 throughout.   Therapy Documentation Precautions:  Precautions Precautions: Fall Precaution Comments: H/o R AKA (s/p revision 1/18) Restrictions Weight Bearing Restrictions: Yes RLE Weight Bearing: Non weight bearing General:   Vital Signs: Therapy Vitals Temp: 97.7 F (36.5 C) Temp Source: Oral Pulse Rate: 74 Resp: 16 BP: (!) 125/59 Patient Position (if appropriate): Sitting Oxygen Therapy SpO2: 100 % O2 Device: Nasal Cannula O2 Flow Rate (L/min): 2 L/min Pain:   Mobility:   Locomotion :    Trunk/Postural Assessment :    Balance:   Exercises:   Other Treatments:      Therapy/Group: Individual Therapy  Junie Panning 02/13/2020, 3:56 PM

## 2020-02-13 NOTE — Progress Notes (Signed)
Physical Therapy Session Note  Patient Details  Name: [name] MRN: [id number] Date of Birth: [birth year]  Today's Date: 02/13/2020 PT Individual Time: 3664-4034 PT Individual Time Calculation (min): 31 min   Short Term Goals: Week 1:  PT Short Term Goal 1 (Week 1): STG=LTG due to LOS  Skilled Therapeutic Interventions/Progress Updates: Pt presents sitting in w/c and agreeable to participate w/ therapy as able.  Pt c/o fatigue and spoke w/ OT prior to entering room and states requiring increased assist.  Pt required total A for donning post-op shoe and then wheeled w/c using BUEs.  Pt required only supervision and occasional verbal cues for safe negotiation into hallway and performed turn to return to side of bed.  Pt negotiated up to 25' and then required seated rest break 2/2 fatigue.  Pt then performed lateral scoot w/c to bed w/ CGA.  Pt required CGA to bring LLE onto bed, after using UEs to turn to recline into bed.  Pt performed LE there ex in sitting w/o UE support for LLE LAQ 2 x 10 and then supine for HS LLE decreasing friction to heel and hip flexion and abd/add 2 x 10 RLE.  Pt able to bridge x 2 to scoot into middle of bed.  Pt remained in bed w/ all needs in reach and bed alarm on.  Spouse present during therapy.     Therapy Documentation Precautions:  Precautions Precautions: Fall Precaution Comments: H/o R AKA (s/p revision 1/18) Restrictions Weight Bearing Restrictions: Yes RLE Weight Bearing: Non weight bearing General:   Vital Signs: Therapy Vitals Temp: 97.7 F (36.5 C) Temp Source: Oral Pulse Rate: 74 Resp: 16 BP: (!) 125/59 Patient Position (if appropriate): Sitting Oxygen Therapy SpO2: 100 % O2 Device: Nasal Cannula O2 Flow Rate (L/min): 2 L/min Pain: 6/10 generalized with pain meds        Therapy/Group: Individual Therapy  [name] 02/13/2020, 3:44 PM

## 2020-02-13 NOTE — Progress Notes (Signed)
Occupational Therapy Weekly Progress Note  Patient Details  Name: [name] MRN: [id number] Date of Birth: [birth year]  Beginning of progress report period: February 06, 2020 End of progress report period: February 13, 2020  Today's Date: 02/13/2020 OT Individual Time: 1300-1400 OT Individual Time Calculation (min): 60 min    Patient has met 3 of 3 short term goals.  Pt has made progress towards goals, however continues to demonstrate decreasing arousal, endurance, and performance in therapy sessions.  Pt is able to complete squat pivot transfers CGA and pushup to partial stand supervision to allow therapist to pull pants over hips in partial stand.  Pt is able to complete LB dressing at bed level if needed.  Pt and wife continue to report continued decrease in endurance and requiring increased physical assistance with self-care tasks and mobility.  Pt and wife question if medication changes are impacting pt endurance and mobility.  Patient continues to demonstrate the following deficits: muscle weakness, decreased cardiorespiratoy endurance and decreased oxygen support and decreased standing balance, decreased postural control and decreased balance strategies and therefore will continue to benefit from skilled OT intervention to enhance overall performance with BADL and Reduce care partner burden.  Patient progressing toward long term goals..  Continue plan of care.  OT Short Term Goals Week 1:  OT Short Term Goal 1 (Week 1): Pt will complete toilet transfers with mod assist OT Short Term Goal 1 - Progress (Week 1): Met OT Short Term Goal 2 (Week 1): Pt will complete LB dressing mod assist at sit > stand level OT Short Term Goal 2 - Progress (Week 1): Met OT Short Term Goal 3 (Week 1): Pt will complete toileting tasks with mod assist OT Short Term Goal 3 - Progress (Week 1): Met Week 2:  OT Short Term Goal 1 (Week 2): STG = LTGs due to ELOS  Skilled Therapeutic Interventions/Progress  Updates:    Treatment session with focus on activity tolerance and endurance.  Pt received upright in w/c reporting increased debility.  Therapist asking questions to decipher pt concerns whether regarding endurance or mobility. Pt with inconsistent responses.  Pt declined any focus on transfers due to decreased endurance and generalized weakness.  Pt reports when feeling "like this" at home he would engage in sit > stand at kitchen sink.  Engaged in sit > stand at sink with pt requiring mod multimodal cues and mod assist for sit > stand requiring facilitation for anterior weight shift and lift off of buttocks.  Pt able to complete partial stand x2 but unable to achieve full upright standing.  Pt completed 3 w/c pushups, still demonstrating decreased clearance of buttocks.  Pt then stating "I'm done".  [name] encouraged pt to transfer back to bed, however pt declined.  Pt remained upright in w/c with seat belt alarm on and all needs in reach.  RN aware of pt and wife concerns.  CIR MD and cardiology MD aware and on board.  Therapy Documentation Precautions:  Precautions Precautions: Fall Precaution Comments: H/o R AKA (s/p revision 1/18) Restrictions Weight Bearing Restrictions: Yes RLE Weight Bearing: Non weight bearing General:   Vital Signs: Therapy Vitals Temp: 97.7 F (36.5 C) Temp Source: Oral Pulse Rate: 74 Resp: 16 BP: (!) 125/59 Patient Position (if appropriate): Sitting Oxygen Therapy SpO2: 100 % O2 Device: Nasal Cannula O2 Flow Rate (L/min): 2 L/min Pain:  pt with no c/o pain   Therapy/Group: Individual Therapy  [name] 02/13/2020, 3:50 PM

## 2020-02-13 NOTE — Plan of Care (Signed)
  Problem: Consults Goal: RH LIMB LOSS PATIENT EDUCATION Description: Description: See Patient Education module for eduction specifics. Outcome: Progressing Goal: Skin Care Protocol Initiated - if Braden Score 18 or less Description: If consults are not indicated, leave blank or document N/A Outcome: Progressing   Problem: RH BOWEL ELIMINATION Goal: RH STG MANAGE BOWEL WITH ASSISTANCE Description: STG Manage Bowel with min Assistance. Outcome: Progressing   Problem: RH BLADDER ELIMINATION Goal: RH STG MANAGE BLADDER WITH EQUIPMENT WITH ASSISTANCE Description: STG Manage Bladder With Equipment With mod I Assistance Outcome: Progressing   Problem: RH SKIN INTEGRITY Goal: RH STG SKIN FREE OF INFECTION/BREAKDOWN Description: Skin will be free of infection with min assist Outcome: Progressing Goal: RH STG MAINTAIN SKIN INTEGRITY WITH ASSISTANCE Description: STG Maintain Skin Integrity With min Assistance. Outcome: Progressing Goal: RH STG ABLE TO PERFORM INCISION/WOUND CARE W/ASSISTANCE Description: STG Able To Perform Incision/Wound Care With min Assistance. Outcome: Progressing   Problem: RH SAFETY Goal: RH STG ADHERE TO SAFETY PRECAUTIONS W/ASSISTANCE/DEVICE Description: STG Adhere to Safety Precautions With min Assistance/Device. Outcome: Progressing   Problem: RH PAIN MANAGEMENT Goal: RH STG PAIN MANAGED AT OR BELOW PT'S PAIN GOAL Outcome: Progressing   

## 2020-02-14 LAB — BASIC METABOLIC PANEL
Anion gap: 7 (ref 5–15)
BUN: 17 mg/dL (ref 8–23)
CO2: 32 mmol/L (ref 22–32)
Calcium: 8.9 mg/dL (ref 8.9–10.3)
Chloride: 86 mmol/L — ABNORMAL LOW (ref 98–111)
Creatinine, Ser: 0.87 mg/dL (ref 0.61–1.24)
GFR, Estimated: >60 mL/min (ref >60)
Glucose, Bld: 88 mg/dL (ref 70–99)
Potassium: 3.9 mmol/L (ref 3.5–5.1)
Sodium: 125 mmol/L — ABNORMAL LOW (ref 135–145)

## 2020-02-14 MED ORDER — FUROSEMIDE 40 MG PO TABS
40.0000 mg | ORAL_TABLET | Freq: Every day | ORAL | Status: DC
  Administered 2020-02-15 – 2020-02-16 (×2): 40 mg via ORAL
  Filled 2020-02-14 (×2): qty 1

## 2020-02-14 NOTE — Plan of Care (Signed)
  Problem: Consults Goal: RH LIMB LOSS PATIENT EDUCATION Description: Description: See Patient Education module for eduction specifics. Outcome: Progressing Goal: Skin Care Protocol Initiated - if Braden Score 18 or less Description: If consults are not indicated, leave blank or document N/A Outcome: Progressing   Problem: RH BOWEL ELIMINATION Goal: RH STG MANAGE BOWEL WITH ASSISTANCE Description: STG Manage Bowel with min Assistance. Outcome: Progressing   Problem: RH BLADDER ELIMINATION Goal: RH STG MANAGE BLADDER WITH EQUIPMENT WITH ASSISTANCE Description: STG Manage Bladder With Equipment With mod I Assistance Outcome: Progressing   Problem: RH SKIN INTEGRITY Goal: RH STG SKIN FREE OF INFECTION/BREAKDOWN Description: Skin will be free of infection with min assist Outcome: Progressing Goal: RH STG MAINTAIN SKIN INTEGRITY WITH ASSISTANCE Description: STG Maintain Skin Integrity With min Assistance. Outcome: Progressing Goal: RH STG ABLE TO PERFORM INCISION/WOUND CARE W/ASSISTANCE Description: STG Able To Perform Incision/Wound Care With min Assistance. Outcome: Progressing   Problem: RH SAFETY Goal: RH STG ADHERE TO SAFETY PRECAUTIONS W/ASSISTANCE/DEVICE Description: STG Adhere to Safety Precautions With min Assistance/Device. Outcome: Progressing   Problem: RH PAIN MANAGEMENT Goal: RH STG PAIN MANAGED AT OR BELOW PT'S PAIN GOAL Outcome: Progressing

## 2020-02-14 NOTE — Progress Notes (Signed)
Occupational Therapy Session Note  Patient Details  Name: [name] MRN: [id number] Date of Birth: 1929/06/16  Today's Date: 02/14/2020 OT Individual Time: 6803-2122 OT Individual Time Calculation (min): 20 min    Short Term Goals: Week 1:  OT Short Term Goal 1 (Week 1): Pt will complete toilet transfers with mod assist OT Short Term Goal 1 - Progress (Week 1): Met OT Short Term Goal 2 (Week 1): Pt will complete LB dressing mod assist at sit > stand level OT Short Term Goal 2 - Progress (Week 1): Met OT Short Term Goal 3 (Week 1): Pt will complete toileting tasks with mod assist OT Short Term Goal 3 - Progress (Week 1): Met  Skilled Therapeutic Interventions/Progress Updates: Initially patient refused and stated he was sick and stated, "I am feeling horrible and not up to doing anything."   He did concur to lateral exercises and education for bed posotioniiiing, along with his wife (retired Marine scientist).    He required moderate assistance for final position.  He was left in his bedwith very supportive wife at end of session.   Continue plan of care.     Therapy Documentation Precautions:  Precautions Precautions: Fall Precaution Comments: H/o R AKA (s/p revision 1/18) Restrictions Weight Bearing Restrictions: Yes RLE Weight Bearing: Non weight bearing General:    Therapy/Group: Individual Therapy  Alfredia Ferguson [facility] 02/14/2020, 12:29 PM

## 2020-02-14 NOTE — Progress Notes (Signed)
Progress Note  Patient Name: [name] Date of Encounter: 02/14/2020  Primary Cardiologist: Sinclair Grooms, MD   Subjective   Does not feel well. No energy. Wife at bedside. Discussed care in detail. Continues to have soft stools.   Inpatient Medications    Scheduled Meds: . (feeding supplement) PROSource Plus  30 mL Oral BID BM  . aspirin EC  81 mg Oral Daily  . atorvastatin  40 mg Oral QHS  . budesonide-formoterol  2 puff Inhalation BID  . Chlorhexidine Gluconate Cloth  6 each Topical Daily  . cholecalciferol  2,000 Units Oral Q lunch  . enoxaparin (LOVENOX) injection  40 mg Subcutaneous Q24H  . finasteride  5 mg Oral Q lunch  . FLUoxetine  10 mg Oral Daily  . fluticasone  2 spray Each Nare Daily  . furosemide  40 mg Intravenous Daily  . guaiFENesin  600 mg Oral BID  . levothyroxine  50 mcg Oral Daily  . metoprolol succinate  25 mg Oral Daily  . mirabegron ER  25 mg Oral Daily  . multivitamin with minerals  1 tablet Oral Daily  . pantoprazole  40 mg Oral Daily  . predniSONE  10 mg Oral Daily  . saccharomyces boulardii  250 mg Oral BID  . sodium chloride  1 g Oral BID WC  . Tiotropium Bromide Monohydrate  2 puff Inhalation Daily  . vitamin B-12  1,000 mcg Oral Daily   Continuous Infusions:  PRN Meds: acetaminophen, ALPRAZolam, bisacodyl, calcium carbonate, HYDROcodone-acetaminophen, levalbuterol, nitroGLYCERIN, polyethylene glycol, polyvinyl alcohol, senna-docusate   Vital Signs    Vitals:   02/13/20 1947 02/13/20 2023 02/14/20 0501 02/14/20 0841  BP: 113/65  122/69   Pulse: 70 70 70   Resp: 15 15 15    Temp: 98.4 F (36.9 C)  97.7 F (36.5 C)   TempSrc:   Oral   SpO2: 100% 99% 100% 100%  Weight:   53 kg   Height:        Intake/Output Summary (Last 24 hours) at 02/14/2020 1032 Last data filed at 02/14/2020 0900 Gross per 24 hour  Intake 420 ml  Output 2950 ml  Net -2530 ml   Filed Weights   02/11/20 0424 02/13/20 0516 02/14/20 0501  Weight: 53.4  kg 53.5 kg 53 kg    Telemetry    None - Personally Reviewed  ECG    SR, PACs - Personally Reviewed  Physical Exam   GEN: frail Neck: No JVD Cardiac: regular rhythm, normal rate, no murmurs, rubs, or gallops.  Respiratory: Clear to auscultation bilaterally. GI: Soft, nontender, non-distended  MS: 1+ L leg edema, R AKA Neuro:  Nonfocal  Psych: Normal affect   Labs    Chemistry Recent Labs  Lab 02/12/20 0505 02/13/20 0550 02/14/20 0512  NA 126* 125* 125*  K 4.0 4.0 3.9  CL 87* 87* 86*  CO2 30 31 32  GLUCOSE 90 87 88  BUN 14 15 17   CREATININE 0.89 0.90 0.87  CALCIUM 8.8* 9.1 8.9  GFRNONAA >60 >60 >60  ANIONGAP 9 7 7      Hematology Recent Labs  Lab 02/08/20 0502 02/09/20 0607 02/13/20 0550  WBC 7.3 7.2 8.2  RBC 2.69* 2.85* 2.85*  HGB 8.5* 8.9* 8.9*  HCT 26.1* 27.1* 27.9*  MCV 97.0 95.1 97.9  MCH 31.6 31.2 31.2  MCHC 32.6 32.8 31.9  RDW 13.9 13.7 13.7  PLT 263 290 307    Cardiac EnzymesNo results for input(s): TROPONINI in  the last 168 hours. No results for input(s): TROPIPOC in the last 168 hours.   BNP Recent Labs  Lab 02/10/20 0646  BNP 334.1*     DDimer No results for input(s): DDIMER in the last 168 hours.   Radiology    No results found.  Cardiac Studies   ECHO 01/28/2020    1. Left ventricular ejection fraction, by estimation, is 60 to 65%. The  left ventricle has normal function. The left ventricle has no regional  wall motion abnormalities. There is mild concentric left ventricular  hypertrophy. Left ventricular diastolic  parameters are indeterminate.   2. Right ventricular systolic function is normal. The right ventricular  size is normal.   3. The mitral valve is normal in structure. No evidence of mitral valve  regurgitation. No evidence of mitral stenosis.   4. The aortic valve is tricuspid. There is mild calcification of the  aortic valve. There is mild thickening of the aortic valve. Aortic valve  regurgitation is not  visualized. Mild aortic valve sclerosis is present,  with no evidence of aortic valve  stenosis.   5. The inferior vena cava is normal in size with greater than 50%  respiratory variability, suggesting right atrial pressure of 3 mmHg.    NM Myoview 2019: IMPRESSION: 1. Large infarct involving the inferior wall including the inferior apex. Difficult to exclude peri-infarct ischemia at the apex and recommend correlation with ECG testing.   2. Abnormal wall motion along the inferior wall base and septal wall as described.   3. Left ventricular ejection fraction is 48%.   4. Non invasive risk stratification*: Intermediate     Patient Profile     87 y.o. male with CAD s/p CABG in 7654, chronic diastolic heart failure, advanced PAD, COPD on home O2, hypertension admitted for right AKA 01/27/2020.  He developed postoperative atrial fibrillation/flutter on 1/24, which was a new finding.   Assessment & Plan   Principal Problem:   Right above-knee amputee [facility]) Active Problems:   DNR (do not resuscitate) discussion   Pressure injury of skin   Normocytic anemia   Chronic diastolic congestive heart failure (HCC)   Major depressive disorder with current active episode   Paroxysmal atrial fibrillation (HCC)   Urinary retention   Prediabetes   #1 Atrial fibrillation/flutter: He had an 8 hour episode of atrial flutter alternating with atrial fibrillation.  He converted to sinus rhythm spontaneously.  His beta blocker was increased.  Plan for 30 day event monitor on discharge from CIR. Heart rates have been stable here.  No plan for long term anticoagulation any unless he has recurrence.    # CAD: # Chest pain: CABG in 2003.  He had a Myoview in 2019 with inferior infarct with peri-infarct ischemia.  Continue aspirin and atorvastatin.  He currently reports chest pain bilaterally at times. -ECG without ischemia, troponin mildly elevated but flat.  - Describes hypotension but non currently  noted. Continue to monitor.   # PAD: Status post right AKA this admission.  He was offered left foot amputation but declined.  Continue aspirin and atorvastatin.  Continue metoprolol.   # Acute on chronic diastolic heart failure: # Hypertension: # Hyponatremia: Volume status is challenging to assess.  He previously developed edema after stopping diuretics.  Furosemide and spironolactone were resumed at lower doses.  BNP was elevated.  His edema is improving with diuresis.  He has chronic hyponatremia and has been treated with salt tablets.  This likely worsens his  edema though it was helpful when his Na was <120 in the past.  If sodium is less than 120 again, would consider a dose of tolvaptan. Sodium stable today. -Hold spironolactone -lasix 40mg  IV.  Likely switch to oral tomorrow. Home dose is furosemide 40 mg AM, 20 mg PM. Would consider resuming just lasix 40 mg po daily and can increase if needed in a few days or weeks.       For questions or updates, please contact [facility] Please consult [email or web address] for contact info under        Signed, Elouise Munroe, MD  02/14/2020, 10:32 AM

## 2020-02-14 NOTE — Progress Notes (Signed)
Physical Therapy Session Note  Patient Details  Name: [name] MRN: [id number] Date of Birth: 23-Dec-1929  Today's Date: 02/14/2020 PT Individual Time: 1445-1540 PT Individual Time Calculation (min): 55 min   Short Term Goals: Week 1:  PT Short Term Goal 1 (Week 1): STG=LTG due to LOS  Skilled Therapeutic Interventions/Progress Updates:    Pt received supine in bed asleep, arousable and agreeable to PT session. Pt reports 6/10 pain "all over". Pt also reports feeling significantly exhausted this date. Nursing notified and able to provide Tylenol and Xanax during session. Supine to sitting EOB with Supervision with increased time and encouragement needed. Squat pivot transfer to w/c with CGA. Pt requesting to don pants prior to leaving his room. Pt able to push up from w/c arm-rests into half-stand position for pants to be pulled up over hips as well as requires assist to thread LLE through pant legs. Pt taken to/from ortho gym dependently via w/c for energy conservation. Squat pivot transfer w/c to/from car at [place] height (22") with CGA. Pt demos good understanding of how to perform transfer safely. Pt requires extended rest breaks between activities this date due to fatigue. Pt on 2L O2 via [place] throughout session, SpO2 remains at 98% and higher. Pt agreeable to stay seated in w/c at end of session, needs in reach, wife present in room at end of session.  Therapy Documentation Precautions:  Precautions Precautions: Fall Precaution Comments: H/o R AKA (s/p revision 1/18) Restrictions Weight Bearing Restrictions: Yes RLE Weight Bearing: Non weight bearing    Therapy/Group: Individual Therapy   [name], PT, DPT  02/14/2020, 4:23 PM

## 2020-02-15 LAB — BASIC METABOLIC PANEL
Anion gap: 10 (ref 5–15)
BUN: 21 mg/dL (ref 8–23)
CO2: 31 mmol/L (ref 22–32)
Calcium: 9.2 mg/dL (ref 8.9–10.3)
Chloride: 84 mmol/L — ABNORMAL LOW (ref 98–111)
Creatinine, Ser: 0.94 mg/dL (ref 0.61–1.24)
GFR, Estimated: >60 mL/min (ref >60)
Glucose, Bld: 88 mg/dL (ref 70–99)
Potassium: 3.8 mmol/L (ref 3.5–5.1)
Sodium: 125 mmol/L — ABNORMAL LOW (ref 135–145)

## 2020-02-15 NOTE — Progress Notes (Signed)
[place] [facility]ICINE [facility] REHABILITATION PROGRESS NOTE  Subjective/Complaints: Patient seen sitting up at the edge of his bed this morning eating breakfast.  Good sitting balance noted.  He states he slept well overnight.  He has questions regarding discharge follow-up appointments.  He was seen by cards yesterday, notes reviewed-determining dose of Lasix.  ROS: + Baseline shortness of breath, stable.  Denies N/V/D  Objective: Vital Signs: Blood pressure 124/69, pulse 72, temperature 98.1 F (36.7 C), resp. rate 15, height 5\' 8"  (1.727 m), weight 55.2 kg, SpO2 95 %. No results found. Recent Labs    02/13/20 0550  WBC 8.2  HGB 8.9*  HCT 27.9*  PLT 307   Recent Labs    02/14/20 0512 02/15/20 0635  NA 125* 125*  K 3.9 3.8  CL 86* 84*  CO2 32 31  GLUCOSE 88 88  BUN 17 21  CREATININE 0.87 0.94  CALCIUM 8.9 9.2    Intake/Output Summary (Last 24 hours) at 02/15/2020 1326 Last data filed at 02/15/2020 0450 Gross per 24 hour  Intake --  Output 1425 ml  Net -1425 ml     Pressure Injury 01/24/20 Coccyx Mid;Lower Stage 2 -  Partial thickness loss of dermis presenting as a shallow open injury with a red, pink wound bed without slough. (Active)  01/24/20 1445  Location: Coccyx  Location Orientation: Mid;Lower  Staging: Stage 2 -  Partial thickness loss of dermis presenting as a shallow open injury with a red, pink wound bed without slough.  Wound Description (Comments):   Present on Admission: Yes     Pressure Injury 02/06/20 Buttocks Left Stage 2 -  Partial thickness loss of dermis presenting as a shallow open injury with a red, pink wound bed without slough. Above previous wound (Active)  02/06/20 (noted by [facility] nurse on consult) 1459  Location: Buttocks  Location Orientation: Left  Staging: Stage 2 -  Partial thickness loss of dermis presenting as a shallow open injury with a red, pink wound bed without slough.  Wound Description (Comments): Above previous wound  Present  on Admission: Yes    Physical Exam: BP 124/69 (BP Location: Right Arm)   Pulse 72   Temp 98.1 F (36.7 C)   Resp 15   Ht 5\' 8"  (1.727 m)   Wt 55.2 kg   SpO2 95%   BMI 18.50 kg/m  Constitutional: No distress . Vital signs reviewed. HENT: Normocephalic.  Atraumatic. Eyes: EOMI. No discharge. Cardiovascular: No JVD.  RRR. Respiratory: Normal effort.  No stridor.  Bilateral clear to auscultation. +[place]. GI: Non-distended.  BS +. Skin: Warm and dry.   Right AKA with sutures CDI hematoma improving Scattered ecchymosis Stage II buttock and coccyx ulcers Psych: Normal mood.  Normal behavior. Musc:  Right AKA with edema and tenderness, improving Left lower extremity with significant edema, improving Neuro: Alert Very HOH Motor: Bilateral upper extremities: 5/5 proximal distal Right lower extremity: Hip flexion 3+/5 (pain inhibition), stable Left lower extremity: Hip flexion, knee extension, ankle dorsiflexion 4 -/5, stable  Assessment/Plan: 1. Functional deficits which require 3+ hours per day of interdisciplinary therapy in a comprehensive inpatient rehab setting.  Physiatrist is providing close team supervision and 24 hour management of active medical problems listed below.  Physiatrist and rehab team continue to assess barriers to discharge/monitor patient progress toward functional and medical goals   Care Tool:  Bathing    Body parts bathed by patient: Right arm,Left arm,Chest,Abdomen,Front perineal area,Buttocks,Left upper leg,Face   Body parts  bathed by helper: Left lower leg Body parts n/a: Right upper leg,Right lower leg   Bathing assist Assist Level: Minimal Assistance - Patient > 75%     Upper Body Dressing/Undressing Upper body dressing   What is the patient wearing?: Pull over shirt    Upper body assist Assist Level: Supervision/Verbal cueing    Lower Body Dressing/Undressing Lower body dressing      What is the patient wearing?: Pants,Underwear/pull  up     Lower body assist Assist for lower body dressing: Moderate Assistance - Patient 50 - 74%     Toileting Toileting    Toileting assist Assist for toileting: 2 Helpers     Transfers Chair/bed transfer  Transfers assist  Chair/bed transfer activity did not occur: Refused  Chair/bed transfer assist level: Contact Guard/Touching assist     Locomotion Ambulation   Ambulation assist      Assist level: 2 helpers Assistive device: Parallel bars Max distance: 42ft   Walk 10 feet activity   Assist  Walk 10 feet activity did not occur: Safety/medical concerns (fatigue, generalized weakness, poor endurance)        Walk 50 feet activity   Assist Walk 50 feet with 2 turns activity did not occur: Safety/medical concerns (fatigue, generalized weakness, poor endurance)         Walk 150 feet activity   Assist Walk 150 feet activity did not occur: Safety/medical concerns (fatigue, generalized weakness, poor endurance)         Walk 10 feet on uneven surface  activity   Assist Walk 10 feet on uneven surfaces activity did not occur: Safety/medical concerns (fatigue, generalized weakness, poor endurance)         Wheelchair     Assist Will patient use wheelchair at discharge?: Yes Type of Wheelchair: Manual    Wheelchair assist level: Supervision/Verbal cueing Max wheelchair distance: 57ft    Wheelchair 50 feet with 2 turns activity    Assist    Wheelchair 50 feet with 2 turns activity did not occur: Refused   Assist Level: Supervision/Verbal cueing   Wheelchair 150 feet activity     Assist      Assist Level: Maximal Assistance - Patient 25 - 49%    Medical Problem List and Plan: 1.  Decreased functional mobility secondary to cellulitis left foot as well as right AKA revision 01/27/2020  Continue CIR 2.  Antithrombotics: -DVT/anticoagulation:Lovenox  on 30mg  BID, changed to daily              -antiplatelet therapy: Aspirin 81  mg daily 3. Pain Management: Tylenol prn per wife   Controlled on 2/6  Mon2/2r with increased exertion. 4. Mood: Provide emotional support  Fluoxetine started on 1/28 for depression-discussed with neuropsychology, appreciate recs.             -antipsychotic agents: N/A 5. Neuropsych: This patient is?  Fully capable of making decisions on his own behalf. 6. Skin/Wound decubitus to coccyx and R AKA.  Skin care as directed.  Routine skin checks- dressing to be changed BID and prn  Monitor wounds 7. Fluids/Electrolytes/Nutrition: Routine in and outs. 8.  ID/cellulitis left foot.  MRSA screen negative.  Blood cultures no growth to date.    Completed Augmentin on 02/05/2020 9.  Atrial flutter/A. fib with RVR.  Toprol 25 mg daily.  Not a candidate for long-term DOAC.  30-day event monitor as outpatient.  Follow-up cardiology services  Controlled on 2/6, appreciate cards recs   Monitor with increased  activity 10.  Hyponatremia.  Lasix and Aldactone resumed at lower doses.    Sodium 125 on 2/6  Continue sodium chloride tabs, resumed on 1/31 11.  Chronic diastolic congestive heart failure.    Lasix and Aldactone per cardiology-appreciate recs  Followed by cardiology services  Monitor for any signs of fluid overload Filed Weights   02/13/20 0516 02/14/20 0501 02/15/20 0443  Weight: 53.5 kg 53 kg 55.2 kg   ?  Reliability on 2/6 12.  Chronic respiratory failure with hypoxia/COPD.  Chronic oxygen therapy 2 L.  Continue chronic prednisone. 13.  Chronic urinary retention.  Chronic Foley tube followed by urology service [name].    Continue Myrbetriq as well as Proscar.    Change Foley catheter to routinely- last changed 01/16/2020  Foley exchanged on 2/3, continue 14.  GERD.  Protonix 15.  Chronic sacral decubitus.  Santyl ointment twice daily.   See #6 16.  Hypothyroidism: Synthroid 17.  Normocytic anemia.  Transfused 1 unit packed red blood cells 01/31/2020.    Hemoglobin 8.9 on 2/4 19.  Hx  constipation improved now with cont stool 20.  Prediabetes  Controlled on 2/4  Monitor with increased mobility   LOS: 10 days A FACE TO FACE EVALUATION WAS PERFORMED  Lupe Handley Lorie Phenix 02/15/2020, 1:26 PM

## 2020-02-15 NOTE — Plan of Care (Signed)
  Problem: Consults Goal: RH LIMB LOSS PATIENT EDUCATION Description: Description: See Patient Education module for eduction specifics. Outcome: Progressing Goal: Skin Care Protocol Initiated - if Braden Score 18 or less Description: If consults are not indicated, leave blank or document N/A Outcome: Progressing   Problem: RH BOWEL ELIMINATION Goal: RH STG MANAGE BOWEL WITH ASSISTANCE Description: STG Manage Bowel with min Assistance. Outcome: Progressing   Problem: RH BLADDER ELIMINATION Goal: RH STG MANAGE BLADDER WITH EQUIPMENT WITH ASSISTANCE Description: STG Manage Bladder With Equipment With mod I Assistance Outcome: Progressing   Problem: RH SKIN INTEGRITY Goal: RH STG SKIN FREE OF INFECTION/BREAKDOWN Description: Skin will be free of infection with min assist Outcome: Progressing Goal: RH STG MAINTAIN SKIN INTEGRITY WITH ASSISTANCE Description: STG Maintain Skin Integrity With min Assistance. Outcome: Progressing Goal: RH STG ABLE TO PERFORM INCISION/WOUND CARE W/ASSISTANCE Description: STG Able To Perform Incision/Wound Care With min Assistance. Outcome: Progressing   Problem: RH SAFETY Goal: RH STG ADHERE TO SAFETY PRECAUTIONS W/ASSISTANCE/DEVICE Description: STG Adhere to Safety Precautions With min Assistance/Device. Outcome: Progressing   Problem: RH PAIN MANAGEMENT Goal: RH STG PAIN MANAGED AT OR BELOW PT'S PAIN GOAL Outcome: Progressing   

## 2020-02-15 NOTE — Discharge Summary (Signed)
Physician Discharge Summary  Patient ID: [name] MRN: [id number] DOB/AGE: [birth year] 87 y.o.  Admit date: 02/05/2020 Discharge date: 02/16/2020  Discharge Diagnoses:  Principal Problem:   Right above-knee amputee [facility]) Active Problems:   DNR (do not resuscitate) discussion   Pressure injury of skin   Normocytic anemia   Chronic diastolic congestive heart failure ([place])   Major depressive disorder with current active episode   Paroxysmal atrial fibrillation ([place])   Urinary retention   Prediabetes DVT prophylaxis Cellulitis left foot COPD GERD Hypothyroidism Sacral decubitus Hyponatremia   Discharged Condition: Stable  Significant Diagnostic Studies: DG Knee 2 Views Right  Result Date: 01/23/2020 CLINICAL DATA:  Prior right above the knee amputation. EXAM: RIGHT KNEE - 1-2 VIEW COMPARISON:  None. FINDINGS: A right-sided above the knee amputation is seen without evidence of acute fracture. Marked severity vascular calcification is noted. IMPRESSION: Right-sided above the knee amputation without evidence of acute osseous abnormality. Electronically Signed   By: Virgina Norfolk M.D.   On: 01/23/2020 20:20   MR FEMUR RIGHT W WO CONTRAST  Result Date: 01/24/2020 CLINICAL DATA:  Left lower extremity wounds EXAM: MRI OF THE RIGHT FEMUR WITHOUT AND WITH CONTRAST TECHNIQUE: Multiplanar, multisequence MR imaging of the right was performed both before and after administration of intravenous contrast. CONTRAST:  72mL GADAVIST GADOBUTROL 1 MMOL/ML IV SOLN COMPARISON:  None. FINDINGS: Bones/Joint/Cartilage The patient is status post above the knee amputation. At the amputation site no definite areas of abnormal marrow signal or cortical destruction. No large hip joint effusions are seen. Ligaments Suboptimally visualized Muscles and Tendons There is increased feathery signal seen within the tensor fascia lata and the vastus lateralis muscle belly. The tendons appear to be intact. Soft tissues  Mild subcutaneous edema seen along the lateral aspect of the hip. There is also mild edema seen along the amputation site. No loculated fluid collection is seen. For IMPRESSION: Mild soft tissue edema seen at the amputation site without evidence of soft tissue abscess or osteomyelitis. Electronically Signed   By: Prudencio Pair M.D.   On: 01/24/2020 19:34   MR FOOT LEFT W WO CONTRAST  Result Date: 01/24/2020 CLINICAL DATA:  Question of osteomyelitis, left lower extremity wounds EXAM: MRI OF THE LEFT FOREFOOT WITHOUT AND WITH CONTRAST TECHNIQUE: Multiplanar, multisequence MR imaging of the left was performed both before and after administration of intravenous contrast. CONTRAST:  72mL GADAVIST GADOBUTROL 1 MMOL/ML IV SOLN COMPARISON:  None. FINDINGS: Bones/Joint/Cartilage Mildly increased T2 hyperintense signal seen at the distal tuft of the first digit. No C8-T1 hypointensity or areas of abnormal enhancement are noted. No areas of cortical destruction or periosteal reaction. Mild first MTP joint osteoarthritis is seen. No large joint effusions are noted. Ligaments The Lisfranc and collateral ligaments are intact. Muscles and Tendons Mildly increased T2 hyperintense signal seen within the muscles surrounding the forefoot appear no areas of focal atrophy or tear. The flexor and extensor tendons are intact. Soft tissues Dorsal subcutaneous edema and mild skin thickening are noted a. This diffusely surround the midfoot and hindfoot. No loculated fluid collections are noted. IMPRESSION: Diffuse subcutaneous edema surrounding the midfoot and hindfoot which could be due to cellulitis. No definite evidence of osteomyelitis. Mildly reactive marrow within the distal tuft of the first digit. Electronically Signed   By: Prudencio Pair M.D.   On: 01/24/2020 19:28   DG CHEST PORT 1 VIEW  Result Date: 02/08/2020 CLINICAL DATA:  New onset atrial flutter to atrial fibrillation. EXAM: PORTABLE CHEST  1 VIEW COMPARISON:  Chest  radiograph dated 01/30/2020 FINDINGS: The heart size is normal. Vascular calcifications are seen in the aortic arch. The lungs are hyperinflated. Scarring in the left costophrenic angle is redemonstrated. Otherwise, both lungs are clear. The bones are osteopenic and degenerative changes are seen in the left shoulder. Median sternotomy wires are noted. IMPRESSION: 1. No acute cardiopulmonary findings. 2. Hyperinflation suggesting chronic obstructive pulmonary disease. Electronically Signed   By: Zerita Boers M.D.   On: 02/08/2020 12:44   DG CHEST PORT 1 VIEW  Result Date: 01/30/2020 CLINICAL DATA:  Cough EXAM: PORTABLE CHEST 1 VIEW COMPARISON:  August 18, 2019 FINDINGS: Lungs are hyperexpanded. There is scarring in the left base. There is no edema or airspace opacity. The heart size and pulmonary vascularity are within normal limits. Patient is status post coronary artery bypass grafting. There is aortic atherosclerosis. No adenopathy. Bones are osteoporotic. IMPRESSION: Lungs hyperexpanded. Mild scarring left base. No edema or airspace opacity. Heart size normal. Status post coronary artery bypass grafting. Aortic Atherosclerosis (ICD10-I70.0). Electronically Signed   By: Lowella Grip III M.D.   On: 01/30/2020 13:43   DG Foot Complete Left  Result Date: 01/23/2020 CLINICAL DATA:  Left foot swelling. EXAM: LEFT FOOT - COMPLETE 3+ VIEW COMPARISON:  None. FINDINGS: There is no evidence of fracture or dislocation. There is no evidence of arthropathy or other focal bone abnormality. There is marked severity diffuse soft tissue swelling, most prominent along the dorsal aspect of the left foot. Mild vascular calcification is seen. IMPRESSION: Marked severity diffuse soft tissue swelling, without an acute osseous abnormality. Electronically Signed   By: Virgina Norfolk M.D.   On: 01/23/2020 20:21   ECHOCARDIOGRAM COMPLETE  Result Date: 01/28/2020    ECHOCARDIOGRAM REPORT   Patient Name:   [name] [facility]  Date of Exam: 01/28/2020 Medical Rec #:  [id number]    Height:       68.0 in Accession #:    [id number]   Weight:       115.3 lb Date of Birth:  1929-06-02    BSA:          1.617 m Patient Age:    85 years     BP:           110/75 mmHg Patient Gender: M            HR:           108 bpm. Exam Location:  Inpatient Procedure: 2D Echo, Cardiac Doppler, Color Doppler and Intracardiac            Opacification Agent Indications:    R07.9* Chest pain, unspecified  History:        Patient has prior history of Echocardiogram examinations, most                 recent 03/09/2017. CHF, CAD, COPD and Carotid Disease,                 Signs/Symptoms:Syncope; Risk Factors:Hypertension and                 Dyslipidemia. Cancer. Elevated Troponin.  Sonographer:    Jonelle Sidle Dance Referring Phys: [place]  1. Left ventricular ejection fraction, by estimation, is 60 to 65%. The left ventricle has normal function. The left ventricle has no regional wall motion abnormalities. There is mild concentric left ventricular hypertrophy. Left ventricular diastolic parameters are indeterminate.  2. Right ventricular systolic function is normal. The right  ventricular size is normal.  3. The mitral valve is normal in structure. No evidence of mitral valve regurgitation. No evidence of mitral stenosis.  4. The aortic valve is tricuspid. There is mild calcification of the aortic valve. There is mild thickening of the aortic valve. Aortic valve regurgitation is not visualized. Mild aortic valve sclerosis is present, with no evidence of aortic valve stenosis.  5. The inferior vena cava is normal in size with greater than 50% respiratory variability, suggesting right atrial pressure of 3 mmHg. FINDINGS  Left Ventricle: Left ventricular ejection fraction, by estimation, is 60 to 65%. The left ventricle has normal function. The left ventricle has no regional wall motion abnormalities. Definity contrast agent was given IV to delineate the  left ventricular  endocardial borders. The left ventricular internal cavity size was normal in size. There is mild concentric left ventricular hypertrophy. Left ventricular diastolic parameters are indeterminate. Right Ventricle: The right ventricular size is normal. No increase in right ventricular wall thickness. Right ventricular systolic function is normal. Left Atrium: Left atrial size was normal in size. Right Atrium: Right atrial size was normal in size. Pericardium: There is no evidence of pericardial effusion. Mitral Valve: The mitral valve is normal in structure. No evidence of mitral valve regurgitation. No evidence of mitral valve stenosis. Tricuspid Valve: The tricuspid valve is normal in structure. Tricuspid valve regurgitation is not demonstrated. No evidence of tricuspid stenosis. Aortic Valve: The aortic valve is tricuspid. There is mild calcification of the aortic valve. There is mild thickening of the aortic valve. Aortic valve regurgitation is not visualized. Mild aortic valve sclerosis is present, with no evidence of aortic valve stenosis. Pulmonic Valve: The pulmonic valve was normal in structure. Pulmonic valve regurgitation is not visualized. No evidence of pulmonic stenosis. Aorta: The aortic root is normal in size and structure. Venous: The inferior vena cava is normal in size with greater than 50% respiratory variability, suggesting right atrial pressure of 3 mmHg. IAS/Shunts: No atrial level shunt detected by color flow Doppler.  LEFT VENTRICLE PLAX 2D LVIDd:         4.10 cm LVIDs:         3.10 cm LV PW:         1.10 cm LV IVS:        1.10 cm LVOT diam:     1.90 cm LV SV:         41 LV SV Index:   25 LVOT Area:     2.84 cm  IVC IVC diam: 1.80 cm LEFT ATRIUM           Index LA diam:      2.70 cm 1.67 cm/m LA Vol (A4C): 8.7 ml  5.40 ml/m  AORTIC VALVE LVOT Vmax:   73.30 cm/s LVOT Vmean:  46.900 cm/s LVOT VTI:    0.143 m  AORTA Ao Root diam: 3.50 cm Ao Asc diam:  3.20 cm MITRAL VALVE MV  Area (PHT): 3.03 cm     SHUNTS MV Decel Time: 250 msec     Systemic VTI:  0.14 m MV E velocity: 104.00 cm/s  Systemic Diam: 1.90 cm MV A velocity: 78.20 cm/s MV E/A ratio:  1.33 Skeet Latch MD Electronically signed by Skeet Latch MD Signature Date/Time: 01/28/2020/12:18:33 PM    Final     Labs:  Basic Metabolic Panel: Recent Labs  Lab 02/10/20 6301 02/11/20 6010 02/12/20 0505 02/13/20 0550 02/14/20 0512 02/15/20 0635  NA 124* 126* 126* 125* 125* 125*  K 4.0 3.8 4.0 4.0 3.9 3.8  CL 89* 88* 87* 87* 86* 84*  CO2 _0 32 31  GLUCOSE 89 132* 90 87 88 88  BUN _1 CREATININE 0.76 0.90 0.89 0.90 0.87 0.94  CALCIUM 8.8* 8.7* 8.8* 9.1 8.9 9.2    CBC: Recent Labs  Lab 02/09/20 0607 02/13/20 0550  WBC 7.2 8.2  HGB 8.9* 8.9*  HCT 27.1* 27.9*  MCV 95.1 97.9  PLT 290 307    CBG: No results for input(s): GLUCAP in the last 168 hours.  Family history.  Mother and father with CAD.  Mother with breast cancer and hypertension.  Brother with hyperlipidemia.  Denies any colon cancer esophageal cancer or rectal cancer  Brief HPI:   [name] is a 87 y.o. right-handed male with history of chronic urinary retention with chronic indwelling Foley catheter followed by [name] of urology services, hypertension, hyperlipidemia, CAD with CABG 2003 followed by cardiology service Dr. Daneen Schick, COPD with 2 L oxygen, peripheral vascular disease diastolic congestive heart failure, right AKA 08/11/2019 as well as history of chronic wounds lower extremities receiving inpatient rehab services 08/21/2019 to 09/01/2019 was discharged to skilled nursing facility.  Presented 01/24/2020 with left lower extremity cellulitis he had been receiving primary care provider maintained on antibiotics.  He was sent to the emergency department for possible admission for cellulitis and initially sent home on doxycycline.  He was later seen at the wound clinic for left foot continued to worsen  despite antibiotics.  Vascular surgery follow-up Dr. Donzetta Matters and on examination was noted to have open wound to the right AKA stump with exposed bone.  X-rays obtained showing some demineralization but no osteomyelitis.  MRI was ordered to evaluate osteomyelitis showing no areas of abnormal marrow signal or cortical destruction.  Patient did undergo revision of right AKA 01/27/2020 per Dr. Donzetta Matters.  Work-up of left lower extremity cellulitis MRSA screen negative blood cultures no growth he did complete 7 days antibiotics but left foot looked worse ESR/CRP slightly elevated placed on Augmentin 01/29/2020 to be completed 02/05/2020 with improvement latest white blood cell count 7700.  Patient had declined amputation of left foot.  Hospital course hyponatremia 130-126.  Cardiology services consulted during hospital stay for episodic chest pain atrial flutter with RVR of new onset.  Placed on Toprol not a candidate for long-term DOAC.  30-day event monitor planned on discharge per cardiology services.  His Lasix and Aldactone remained on hold due to hyponatremia.  Palliative care consulted to establish goals of care.  Patient with decubitus ulcer buttocks Santyl ointment twice daily with wound care follow-up.  Random Covid testing 02/04/2020 -.  Due to patient's decreased functional mobility he was admitted for a comprehensive rehab program.   Hospital Course: [name] was admitted to rehab 02/05/2020 for inpatient therapies to consist of PT, ST and OT at least three hours five days a week. Past admission physiatrist, therapy team and rehab RN have worked together to provide customized collaborative inpatient rehab.  Pertaining to patient's cellulitis left foot as well as right AKA revision 01/27/2020.  Patient would follow-up vascular surgery.  He was maintained on Lovenox for DVT prophylaxis as well as low-dose aspirin therapy.  Pain managed with use of Tylenol as needed as well as hydrocodone.  Mood stabilization with  fluoxetine initiated 02/06/2020 neuropsychology follow-up emotional support provided.  In regards to patient's cellulitis left foot MRSA screen negative blood cultures  no growth to date completed course of Augmentin 02/05/2020 patient had refused any surgical intervention the left foot.  Close monitoring atrial flutter atrial fibrillation RVR per cardiology services Toprol as advised not a candidate for long-term DOAC 30-day event monitor advised.  Hospital course complicated by hyponatremia initially Lasix and Aldactone were held per cardiology services and low-dose Lasix was resumed Aldactone remained on hold.  He exhibited no other signs of fluid overload.  Chronic respiratory failure COPD chronic oxygen as advised as well as prednisone therapy and inhalers as directed.  Chronic urinary retention Foley catheter tube followed by urology service [name] maintained on Proscar as well as myrbetriq.  Foley catheter tube was changed routinely.  GERD Protonix as advised.  Chronic sacral decubitus Santyl ointment twice daily and routine monitoring of skin.  Hormone supplement ongoing for hypothyroidism.  Normocytic anemia he was transfused 1 unit packed red blood cells 01/31/2020 latest hemoglobin 8.9 no bleeding episodes.  Prediabetes blood sugars overall controlled.  Lipitor ongoing for hyperlipidemia.   Blood pressures were monitored on TID basis and soft and monitored  Diabetes has been monitored with ac/hs CBG checks and SSI was use prn for tighter BS control.    Rehab course: During patient's stay in rehab weekly team conferences were held to monitor patient's progress, set goals and discuss barriers to discharge. At admission, patient required min assist lateral scoot transfers, moderate assist squat pivot transfers moderate assist sit to stand, minimal guard upper body bathing max is lower body bathing minimal guard upper body dressing max assist lower body dressing  Physical exam.  Blood pressure  109/57 pulse 77 temperature 98.1 respirations 18 oxygen saturations 92% Constitutional.  No acute distress HEENT Head.  Normocephalic and atraumatic Eyes.  Pupils round and reactive to light no discharge that nystagmus Neck.  Supple nontender no JVD without thyromegaly Cardiac irregular irregular Abdomen.  Soft nontender positive bowel sounds without rebound Respiratory effort normal no respiratory distress without wheeze Genitourinary.  Chronic Foley tube in place Musculoskeletal.  Normal range of motion no rigidity Comments.  Upper extremities 5 -/5 in biceps triceps wrist extension grip and finger abduction bilaterally Right lower extremity hip flexion at least 3/5 but painful very edematous right AKA very short AKA Left lower extremity hip flexion is 4/5 knee extension 4/5 dorsiflexion 4 -/5 plantarflexion 4/5 Skin.  Surgical dressings in place.  Sacral decubitus not examined as patient was up in chair  He/She  has had improvement in activity tolerance, balance, postural control as well as ability to compensate for deficits. He/She has had improvement in functional use RUE/LUE  and RLE/LLE as well as improvement in awareness.  Supine to sitting edge of bed with supervision with increased time encouragement needed.  Squat pivot transfer to wheelchair with contact-guard assist.  Patient able to push up from wheelchair armrest and to have stand position for pants to be pulled up over hips.  Squat pivot transfers wheelchair to car simulation.  [employer] height with contact-guard assist.  Patient did require rest breaks.  Patient able to complete squat pivot transfers contact-guard assist and pushed up to partial stand supervision as noted complete lower body dressing at bed level with assistance.  Patient demonstrated deficits in muscle weakness decreased cardiorespiratory endurance.  He did need ongoing encouragement at times to participate.  Full family teaching completed plan discharge to  home       Disposition:  Discharge to home    Diet: Regular consistency 1500 mL fluid restriction  Special Instructions: No driving smoking or alcohol  Continue oxygen therapy as prior to admission  Wound care.  Wound care to right stump full-thickness wound.  Cleanse with normal saline, pat dry, cover with Xeroform gauze.  Top with dry dressing and secure with paper tape  Wound care to stage II partial-thickness skin loss at coccyx.  Cleanse with soap and water, rinse and pat dry, apply Xeroform gauze to the wounds at the coccyx.  Topical silicone foam for sacrum with orientation of tip toward head and away from rectum.  Turn and reposition patient from side to side to maintain skin routinely  Follow-up urology service [name] for routine change of chronic Foley tube  Medications at discharge 1.  Tylenol as needed 2.  Xanax 0.25 mg 3 times daily as needed anxiety 3.  Aspirin 81 mg p.o. daily 4.  Lipitor 40 mg p.o. nightly 5.  Symbicort 160-4.5 mcg 2 puffs twice daily 6.  Vitamin D 2000 units p.o. daily with lunch 7.  Proscar 5 mg p.o. daily 8.  Prozac 10 mg p.o. daily 9.  Lasix 40 mg p.o. daily 10.  Mucinex 600 mg p.o. twice daily 11.  Hydrocodone 1 tablet every 4 as needed moderate pain 12.  Xopenex 2 puffs every 6 hours as needed wheezing 13.  Synthroid 50 mcg p.o. daily 14.  Toprol-XL 25 mg daily 15.Myrbetriq 25 mg p.o. daily 16.  Multivitamin daily 17.  Nitroglycerin as needed 18.  Protonix 40 mg p.o. daily 19.  Prednisone 10 mg p.o. daily 20.  Sodium chloride tablet 1 g p.o. twice daily 21.  Tiotropium bromide 2 puffs daily 22.  Vitamin B12 1000 mcg p.o. daily  30-35 minutes were spent completing discharge summary and discharge planning  Discharge Instructions    Ambulatory referral to Physical Medicine Rehab   Complete by: As directed    Moderate complexity follow up 1-2 weeks right AKA revision       Follow-up Information    Jamse Arn, MD  Follow up.   Specialty: Physical Medicine and Rehabilitation Why: Office to call for appointment Contact information: [place] [address] [phone]        Belva Crome, MD Follow up.   Specialty: Cardiology Why: Call for appointment Contact information: [address] [address] [phone]        Tanda Rockers, MD Follow up.   Specialty: Pulmonary Disease Why: Call for appointment Contact information: [address] [place] [address] [phone]        Waynetta Sandy, MD Follow up.   Specialties: Vascular Surgery, Cardiology Why: Call for appointment Contact information: [address] [place] [address] [phone]               Signed: Lavon Paganini [place] 02/16/2020, 5:18 AM

## 2020-02-15 NOTE — Progress Notes (Signed)
Physical Therapy Session Note  Patient Details  Name: [name] MRN: [id number] Date of Birth: 30-Apr-1929  Today's Date: 02/15/2020 PT Individual Time: 0938-1829 PT Individual Time Calculation (min): 65 min   Short Term Goals: Week 1:  PT Short Term Goal 1 (Week 1): STG=LTG due to LOS  Skilled Therapeutic Interventions/Progress Updates:     Patient in w/c with his wife in the room upon PT arrival. Patient alert and agreeable to PT session. Patient denied pain during session, however reported increased fatigue, SOB, and feeling faint with mobility. Vitals WNL, RN made aware.  Patient on 2L/min O2 throughout session, SPO2 >98% throughout.   Therapeutic Activity: Bed Mobility: Patient performed sit to supine with mod I with use of hospital bed features.  Transfers: Patient performed squat pivot w/c>bed with CGA for safety due to patient feeling faint. Provided verbal cues for w/c set-up for transfer. Patient performed a simulated 22" sedan height car transfer with CGA from his wife using squat pivot technique. Provided cues for safe technique, w/c set-up, and safe guarding.  Wheelchair Mobility:  Patient with concerns about use of 16"x16" w/c due to tightness at his hips and increased discomfort when sitting for prolonged periods. Educated patient and his wife on risk of pressure injury with prolonged sitting in a w/c that is too small. Discussed use of 18"x18" w/c without spokes for improved household mobility and sitting tolerance versus use of 16"x16" w/c for household mobility and sitting in a larger chair throughout the day. Patient and his wife unable to make a decision on their preference with reach out to CSW and lead PT tomorrow prior to d/c. Patient also with concerns about length of ELR for his L leg. Educated on flipping the foot rest out to the side for increased knee extension with elevation. Patient would benefit from a longer ELR for improved positioning, edema control, and  sitting tolerance, will pass on to CSW.  Patient required increased time for all discussions and mobility due to hearing deficits, despite use of sound amplifier throughout session, and due to increased fatigue. Patient and his wife appreciative of education throughout session.   Patient in bed with his wife at bed side at end of session with breaks locked, bed alarm set, and all needs within reach.    Therapy Documentation Precautions:  Precautions Precautions: Fall Precaution Comments: H/o R AKA (s/p revision 1/18) Restrictions Weight Bearing Restrictions: Yes RLE Weight Bearing: Non weight bearing   Therapy/Group: Individual Therapy  Devlyn Parish L Aquanetta Schwarz PT, DPT  02/15/2020, 5:42 PM

## 2020-02-15 NOTE — Progress Notes (Signed)
Occupational Therapy Session Note  Patient Details  Name: [name] MRN: [id number] Date of Birth: 11/03/1929  Today's Date: 02/15/2020 OT Individual Time: 1035-1130 OT Individual Time Calculation (min): 55 min    Short Term Goals: Week 2:  OT Short Term Goal 1 (Week 2): STG = LTGs due to ELOS  Skilled Therapeutic Interventions/Progress Updates:    Pt received sitting EOB with wife present. Pt reporting he is not feeling well today but his wife was very encouraging in him needing to participate and he was agreeable. Pt on 2L O2 via [place] throughout session. He reported 6/10 pain in his residual limb and shoulders from arthritis, no request for intervention. Pt completed squat pivot transfer from EOB to the w/c with supervision. He was taken over the sink for face washing and oral care. He completed UB dressing with supervision. LB dressing he required assist to thread foley and to thread over LLE and R residual limb, so mod A overall. His wife was able to assist with LB dressing as needed. Pt and his wife had several questions re extension of leg rest, extra time spent trying to adjust and discuss alternatives for leg elevation. Pt was left sitting up with all needs met.   Therapy Documentation Precautions:  Precautions Precautions: Fall Precaution Comments: H/o R AKA (s/p revision 1/18) Restrictions Weight Bearing Restrictions: Yes RLE Weight Bearing: Non weight bearing  Therapy/Group: Individual Therapy   H  02/15/2020, 6:30 AM  

## 2020-02-16 ENCOUNTER — Other Ambulatory Visit: Payer: Self-pay | Admitting: Physician Assistant

## 2020-02-16 ENCOUNTER — Telehealth: Payer: Self-pay | Admitting: Internal Medicine

## 2020-02-16 DIAGNOSIS — I480 Paroxysmal atrial fibrillation: Secondary | ICD-10-CM

## 2020-02-16 MED ORDER — VITAMIN D3 50 MCG (2000 UT) PO TABS: 2000.0000 [IU] | ORAL_TABLET | Freq: Every day | ORAL | 0 refills | Status: AC

## 2020-02-16 MED ORDER — GUAIFENESIN ER 600 MG PO TB12: 600.0000 mg | ORAL_TABLET | Freq: Two times a day (BID) | ORAL | 0 refills | Status: AC

## 2020-02-16 MED ORDER — MYRBETRIQ 25 MG PO TB24: 25.0000 mg | ORAL_TABLET | Freq: Every day | ORAL | 0 refills | Status: DC

## 2020-02-16 MED ORDER — GUAIFENESIN ER 600 MG PO TB12
600.0000 mg | ORAL_TABLET | Freq: Two times a day (BID) | ORAL | 0 refills | Status: DC
Start: 2020-02-16 — End: 2020-02-16

## 2020-02-16 MED ORDER — FINASTERIDE 5 MG PO TABS: 5.0000 mg | ORAL_TABLET | Freq: Every day | ORAL | 0 refills | Status: AC

## 2020-02-16 MED ORDER — METOPROLOL SUCCINATE ER 25 MG PO TB24: 25.0000 mg | ORAL_TABLET | Freq: Every day | ORAL | 0 refills | Status: DC

## 2020-02-16 MED ORDER — SYMBICORT 160-4.5 MCG/ACT IN AERO: 2.0000 | INHALATION_SPRAY | Freq: Two times a day (BID) | RESPIRATORY_TRACT | 5 refills | Status: AC

## 2020-02-16 MED ORDER — ATORVASTATIN CALCIUM 40 MG PO TABS: 40.0000 mg | ORAL_TABLET | Freq: Every day | ORAL | 0 refills | Status: DC

## 2020-02-16 MED ORDER — VITAMIN B-12 1000 MCG PO TABS: 1000.0000 ug | ORAL_TABLET | Freq: Every day | ORAL | 0 refills | Status: AC

## 2020-02-16 MED ORDER — ALPRAZOLAM 0.25 MG PO TABS
0.2500 mg | ORAL_TABLET | Freq: Three times a day (TID) | ORAL | 0 refills | Status: DC | PRN
Start: 2020-02-16 — End: 2020-03-05

## 2020-02-16 MED ORDER — FUROSEMIDE 40 MG PO TABS: 40.0000 mg | ORAL_TABLET | Freq: Every day | ORAL | 0 refills | Status: DC

## 2020-02-16 MED ORDER — SODIUM CHLORIDE 1 G PO TABS: 1.0000 g | ORAL_TABLET | Freq: Two times a day (BID) | ORAL | 0 refills | Status: DC

## 2020-02-16 MED ORDER — PREDNISONE 10 MG PO TABS: 10.0000 mg | ORAL_TABLET | Freq: Every day | ORAL | 0 refills | Status: DC

## 2020-02-16 MED ORDER — HYDROCODONE-ACETAMINOPHEN 5-325 MG PO TABS: 1.0000 | ORAL_TABLET | ORAL | 0 refills | Status: DC | PRN

## 2020-02-16 MED ORDER — LEVOTHYROXINE SODIUM 50 MCG PO TABS: 50.0000 ug | ORAL_TABLET | Freq: Every day | ORAL | 0 refills | Status: DC

## 2020-02-16 MED ORDER — PANTOPRAZOLE SODIUM 40 MG PO TBEC: DELAYED_RELEASE_TABLET | ORAL | 3 refills | Status: AC

## 2020-02-16 MED ORDER — LEVALBUTEROL TARTRATE 45 MCG/ACT IN AERO: 2.0000 | INHALATION_SPRAY | RESPIRATORY_TRACT | 5 refills | Status: AC | PRN

## 2020-02-16 MED ORDER — FLUOXETINE HCL 10 MG PO CAPS: 10.0000 mg | ORAL_CAPSULE | Freq: Every day | ORAL | 3 refills | Status: DC

## 2020-02-16 NOTE — Progress Notes (Signed)
Patient ID: JAKOLBY SEDIVY, male   DOB: 05/08/1929, 87 y.o.   MRN: [id number]  Per PT reports, pt wife would like an 18x18 w/c vs 16x16 as this w/c is too small, and would also like elevating leg rest extenders.   Sw followed up with vendor to get updates on hospital bed delivery. Reports a message was left for wife on Friday. SW called pt wife to follow-up about above. Pt wife reports she did not receive call about hospital bed. SW provided contact information for [place]. She confirms she would like about DME changes. SW informed unsure on how quickly this can happen with vendor but will submit request.   *[facility] to have DME: w/c and elevating legs rests delivered to home along with hospital bed.   Loralee Pacas, MSW, [place] Office: [phone] Cell: [phone] Fax: [phone]

## 2020-02-16 NOTE — Discharge Instructions (Signed)
Inpatient Rehab Discharge Instructions  [name] Discharge date and time: No discharge date for patient encounter.   Activities/Precautions/ Functional Status: Activity: activity as tolerated Diet: Regular consistency 1500 mL fluid restriction Wound Care: Routine skin checks Functional status:  ___ No restrictions     ___ Walk up steps independently ___ 24/7 supervision/assistance   ___ Walk up steps with assistance ___ Intermittent supervision/assistance  ___ Bathe/dress independently ___ Walk with walker     _x__ Bathe/dress with assistance ___ Walk Independently    ___ Shower independently ___ Walk with assistance    ___ Shower with assistance ___ No alcohol     ___ Return to work/school ________   COMMUNITY REFERRALS UPON DISCHARGE:    Home Health:   PT    OT    RN                  Agency: [facility]   (resume services) DJSHF:[phone]   *Please expect follow-up within 2-3 days of discharge to schedule your home visit. If you  Have not received follow-up, be sure to follow-up with branch directly.*   Medical Equipment/Items Ordered: hospital bed, wheelchair                                                 Agency/Supplier: [facility] [phone]    Special Instructions:  No driving smoking or alcohol  Continue chronic oxygen therapy as directed  Wound care.  Wound care to right stump full-thickness wound.  Cleanse with normal saline, pat dry, cover with Xeroform gauze.  Top dry dressing and secure with paper tape  Wound care to stage II partial-thickness skin loss at coccyx.  Cleanse with soap and water, rinse and pat dry, apply Xeroform gauze to the wounds of the coccyx.  Topical silicone foam for sacrum with orientation of tip toward head and away from rectum.  Turn and reposition patient from side to side maintain skin routinely.  Right stump full-thickness wound cleanse with normal saline, pat dry cover with Xeroform gauze top with dry dressing and  secure with paper tape  My questions have been answered and I understand these instructions. I will adhere to these goals and the provided educational materials after my discharge from the hospital.  Patient/Caregiver Signature _______________________________ Date __________  Clinician Signature _______________________________________ Date __________  Please bring this form and your medication list with you to all your follow-up doctor's appointments.

## 2020-02-16 NOTE — Plan of Care (Signed)
  Problem: Consults Goal: RH LIMB LOSS PATIENT EDUCATION Description: Description: See Patient Education module for eduction specifics. Outcome: Completed/Met Goal: Skin Care Protocol Initiated - if Braden Score 18 or less Description: If consults are not indicated, leave blank or document N/A Outcome: Completed/Met   Problem: RH BOWEL ELIMINATION Goal: RH STG MANAGE BOWEL WITH ASSISTANCE Description: STG Manage Bowel with min Assistance. Outcome: Completed/Met   Problem: RH BLADDER ELIMINATION Goal: RH STG MANAGE BLADDER WITH EQUIPMENT WITH ASSISTANCE Description: STG Manage Bladder With Equipment With mod I Assistance Outcome: Completed/Met   Problem: RH SKIN INTEGRITY Goal: RH STG SKIN FREE OF INFECTION/BREAKDOWN Description: Skin will be free of infection with min assist Outcome: Completed/Met Goal: RH STG MAINTAIN SKIN INTEGRITY WITH ASSISTANCE Description: STG Maintain Skin Integrity With min Assistance. Outcome: Completed/Met Goal: RH STG ABLE TO PERFORM INCISION/WOUND CARE W/ASSISTANCE Description: STG Able To Perform Incision/Wound Care With min Assistance. Outcome: Completed/Met   Problem: RH SAFETY Goal: RH STG ADHERE TO SAFETY PRECAUTIONS W/ASSISTANCE/DEVICE Description: STG Adhere to Safety Precautions With min Assistance/Device. Outcome: Completed/Met   Problem: RH PAIN MANAGEMENT Goal: RH STG PAIN MANAGED AT OR BELOW PT'S PAIN GOAL Outcome: Completed/Met

## 2020-02-16 NOTE — Progress Notes (Signed)
Occupational Therapy Discharge Summary  Patient Details  Name: [name] MRN: [id number] Date of Birth: 1929/04/18   Patient has met 7 of 7 long term goals due to improved activity tolerance, improved balance, ability to compensate for deficits and improved awareness.  Patient to discharge at overall Supervision -Min assist level.  Patient's care partner is independent to provide the necessary physical assistance at discharge.  Patient continues to demonstrate fluctuations in energy level, requiring increased time and encouragement to engage in self-care, mobility, and therapeutic activities.  Pt is able to complete squat pivot/lateral scoot transfers with Supervision.  Pt has demonstrated ability to complete sit > stand and/or sit > partial stand to allow for assist with clothing management vs completing with lateral leans at bed level.    Reasons goals not met: N/A  Recommendation:  Patient will benefit from ongoing skilled OT services in home health setting to continue to advance functional skills in the area of BADL and Reduce care partner burden.  Equipment: No equipment provided  Has all from previous admission  Reasons for discharge: treatment goals met and discharge from hospital  Patient/family agrees with progress made and goals achieved: Yes  OT Discharge Precautions/Restrictions  Precautions Precautions: Fall Precaution Comments: H/o R AKA (s/p revision 1/18) Required Braces or Orthoses: Other Brace Other Brace: had post op shoe prior to R AKA revision Restrictions Weight Bearing Restrictions: Yes RLE Weight Bearing: Non weight bearing General   Vital Signs Therapy Vitals Temp: 98 F (36.7 C) Pulse Rate: 79 Resp: 15 BP: (!) 107/54 Patient Position (if appropriate): Lying Oxygen Therapy SpO2: 97 % O2 Device: Room Air Pain Pain Assessment Pain Scale: 0-10 Pain Score: 4  Pain Location: Generalized Pain Descriptors / Indicators: Discomfort Patients Stated  Pain Goal: 2 Pain Intervention(s): Medication (See eMAR) ADL ADL Eating: Set up Where Assessed-Eating: Chair Grooming: Supervision/safety Where Assessed-Grooming: Sitting at sink Upper Body Bathing: Supervision/safety,Setup Where Assessed-Upper Body Bathing: Edge of bed Lower Body Bathing: Minimal assistance Where Assessed-Lower Body Bathing: Edge of bed,Bed level Upper Body Dressing: Supervision/safety,Setup Where Assessed-Upper Body Dressing: Edge of bed Lower Body Dressing: Minimal assistance Where Assessed-Lower Body Dressing: Bed level,Edge of bed Toileting: Minimal assistance Where Assessed-Toileting: Bedside Commode Toilet Transfer: Close supervision Toilet Transfer Method: Squat pivot Toilet Transfer Equipment: Drop arm bedside commode Vision Baseline Vision/History: Wears glasses Wears Glasses: At all times Patient Visual Report: No change from baseline Vision Assessment?: No apparent visual deficits Perception  Perception: Within Functional Limits Praxis Praxis: Intact Cognition Overall Cognitive Status: Within Functional Limits for tasks assessed Arousal/Alertness: Awake/alert Orientation Level: Oriented X4 Attention: Sustained Sustained Attention: Appears intact Memory: Appears intact Awareness: Appears intact Problem Solving: Appears intact Safety/Judgment: Appears intact Comments: pt is fearful of falling, requires encouragement during therapy sessions Sensation Sensation Light Touch: Appears Intact Proprioception: Impaired by gross assessment Coordination Gross Motor Movements are Fluid and Coordinated: No Fine Motor Movements are Fluid and Coordinated: Yes Coordination and Movement Description: grossly uncoordinated due to R AKA, decreased balance/postural control, generalized weakness, poor endurance, and fatigue Finger Nose Finger Test: [facility] but slow Heel Shin Test: WFL but slow on LLE, unable to perform on RLE due to AKA Motor  Motor Motor:  Abnormal postural alignment and control Motor - Skilled Clinical Observations: grossly uncoordinated due to R AKA, decreased balance/postural control, generalized weakness, poor activity tolerance, and fatigue Mobility  Bed Mobility Bed Mobility: Rolling Right;Rolling Left;Sit to Supine;Supine to Sit Rolling Right: Supervision/verbal cueing Rolling Left: Supervision/Verbal cueing Supine to Sit: Supervision/Verbal  cueing Sit to Supine: Supervision/Verbal cueing Transfers Sit to Stand: Minimal Assistance - Patient > 75% Stand to Sit: Contact Guard/Touching assist  Trunk/Postural Assessment  Cervical Assessment Cervical Assessment: Exceptions to [facility] (forward head) Thoracic Assessment Thoracic Assessment: Exceptions to [facility] (rounded shoulders) Lumbar Assessment Lumbar Assessment: Exceptions to [facility] (posterior pelvic tilt) Postural Control Postural Control: Deficits on evaluation  Balance Balance Balance Assessed: Yes Static Sitting Balance Static Sitting - Balance Support: Feet supported;Bilateral upper extremity supported Static Sitting - Level of Assistance: 6: Modified independent (Device/Increase time) Dynamic Sitting Balance Dynamic Sitting - Balance Support: Feet supported;Bilateral upper extremity supported Dynamic Sitting - Level of Assistance: 5: Stand by assistance (supervision) Static Standing Balance Static Standing - Balance Support: Bilateral upper extremity supported (RW) Static Standing - Level of Assistance: 5: Stand by assistance (CGA) Dynamic Standing Balance Dynamic Standing - Balance Support: Bilateral upper extremity supported (RW) Dynamic Standing - Level of Assistance: 5: Stand by assistance (CGA) Extremity/Trunk Assessment RUE Assessment RUE Assessment: Within Functional Limits General Strength Comments: grossly 4-5/5 LUE Assessment LUE Assessment: Within Functional Limits General Strength Comments: grossly 4-5/5   Gunther Zawadzki, [facility] 02/16/2020, 8:03 AM

## 2020-02-16 NOTE — Progress Notes (Signed)
Progress Note  Patient Name: [name] Date of Encounter: 02/16/2020  [facility] Cardiologist: Belva Crome III, MD   Subjective   Stable sodium [phone]) and renal function (Cr 0.9). BP 107/54.  Reports dyspnea this morning.  Inpatient Medications    Scheduled Meds: . (feeding supplement) PROSource Plus  30 mL Oral BID BM  . aspirin EC  81 mg Oral Daily  . atorvastatin  40 mg Oral QHS  . budesonide-formoterol  2 puff Inhalation BID  . Chlorhexidine Gluconate Cloth  6 each Topical Daily  . cholecalciferol  2,000 Units Oral Q lunch  . enoxaparin (LOVENOX) injection  40 mg Subcutaneous Q24H  . finasteride  5 mg Oral Q lunch  . FLUoxetine  10 mg Oral Daily  . fluticasone  2 spray Each Nare Daily  . furosemide  40 mg Oral Daily  . guaiFENesin  600 mg Oral BID  . levothyroxine  50 mcg Oral Daily  . metoprolol succinate  25 mg Oral Daily  . mirabegron ER  25 mg Oral Daily  . multivitamin with minerals  1 tablet Oral Daily  . pantoprazole  40 mg Oral Daily  . predniSONE  10 mg Oral Daily  . saccharomyces boulardii  250 mg Oral BID  . sodium chloride  1 g Oral BID WC  . Tiotropium Bromide Monohydrate  2 puff Inhalation Daily  . vitamin B-12  1,000 mcg Oral Daily   Continuous Infusions:  PRN Meds: acetaminophen, ALPRAZolam, bisacodyl, calcium carbonate, HYDROcodone-acetaminophen, levalbuterol, nitroGLYCERIN, polyethylene glycol, polyvinyl alcohol, senna-docusate   Vital Signs    Vitals:   02/15/20 0443 02/15/20 1434 02/15/20 1930 02/16/20 0430  BP: 124/69 (!) 110/53 (!) 111/54 (!) 107/54  Pulse: 72 72 79 79  Resp: 15 19 17 15   Temp: 98.1 F (36.7 C) 98.1 F (36.7 C) 98.7 F (37.1 C) 98 F (36.7 C)  TempSrc:   Oral   SpO2: 95% 100% 97% 97%  Weight: 55.2 kg   55 kg  Height:        Intake/Output Summary (Last 24 hours) at 02/16/2020 1022 Last data filed at 02/16/2020 0431 Gross per 24 hour  Intake 460 ml  Output 1500 ml  Net -1040 ml   Last 3 Weights  02/16/2020 02/15/2020 02/14/2020  Weight (lbs) 121 lb 4.1 oz 121 lb 11.1 oz 116 lb 13.5 oz  Weight (kg) 55 kg 55.2 kg 53 kg      Telemetry    n/a - Personally Reviewed  ECG    n/a - Personally Reviewed  Physical Exam   VS:  BP (!) 107/54 (BP Location: Right Arm)   Pulse 79   Temp 98 F (36.7 C)   Resp 15   Ht 5\' 8"  (1.727 m)   Wt 55 kg   SpO2 97%   BMI 18.44 kg/m  , BMI Body mass index is 18.44 kg/m. GENERAL:  Frail.  Chronically ill-appearing HEENT: Pupils equal round and reactive, fundi not visualized, oral mucosa unremarkable NECK:  No jugular venous distention, waveform within normal limits, carotid upstroke brisk and symmetric, no bruits LUNGS: CTAB. HEART:  RRR.  PMI not displaced or sustained,S1 and S2 within normal limits, no S3, no S4, no clicks, no rubs, no murmurs ABD:  Mildly distended.  Non-tender.  Positive bowel sounds normal in frequency in pitch, no bruits, no rebound, no guarding, no midline pulsatile mass, no hepatomegaly, no splenomegaly EXT: , 1+ L LE edema, no cyanosis no clubbing SKIN:  No  rashes no nodules NEURO:  Cranial nerves II through XII grossly intact, motor grossly intact throughout PSYCH:  Cognitively intact, oriented to person place and time   Labs    High Sensitivity Troponin:   Recent Labs  Lab 01/26/20 1406 01/26/20 1536 02/13/20 0926 02/13/20 1129  TROPONINIHS 107* 115* 34* 29*      Chemistry Recent Labs  Lab 02/13/20 0550 02/14/20 0512 02/15/20 0635  NA 125* 125* 125*  K 4.0 3.9 3.8  CL 87* 86* 84*  CO2 31 32 31  GLUCOSE 87 88 88  BUN 15 17 21   CREATININE 0.90 0.87 0.94  CALCIUM 9.1 8.9 9.2  GFRNONAA >60 >60 >60  ANIONGAP 7 7 10      Hematology Recent Labs  Lab 02/13/20 0550  WBC 8.2  RBC 2.85*  HGB 8.9*  HCT 27.9*  MCV 97.9  MCH 31.2  MCHC 31.9  RDW 13.7  PLT 307    BNP Recent Labs  Lab 02/10/20 0646  BNP 334.1*     DDimer No results for input(s): DDIMER in the last 168 hours.   Radiology     No results found.  Cardiac Studies   ECHO 01/28/2020  1. Left ventricular ejection fraction, by estimation, is 60 to 65%. The  left ventricle has normal function. The left ventricle has no regional  wall motion abnormalities. There is mild concentric left ventricular  hypertrophy. Left ventricular diastolic  parameters are indeterminate.  2. Right ventricular systolic function is normal. The right ventricular  size is normal.  3. The mitral valve is normal in structure. No evidence of mitral valve  regurgitation. No evidence of mitral stenosis.  4. The aortic valve is tricuspid. There is mild calcification of the  aortic valve. There is mild thickening of the aortic valve. Aortic valve  regurgitation is not visualized. Mild aortic valve sclerosis is present,  with no evidence of aortic valve  stenosis.  5. The inferior vena cava is normal in size with greater than 50%  respiratory variability, suggesting right atrial pressure of 3 mmHg.   NM Myoview 2019: IMPRESSION: 1. Large infarct involving the inferior wall including the inferior apex. Difficult to exclude peri-infarct ischemia at the apex and recommend correlation with ECG testing.  2. Abnormal wall motion along the inferior wall base and septal wall as described.  3. Left ventricular ejection fraction is 48%.  4. Non invasive risk stratification*: Intermediate  Patient Profile     87 y.o. male with CAD s/p CABG in 2637, chronic diastolic heart failure, advanced PAD, COPD on home O2, hypertension admitted for right AKA 01/27/2020.  He developed postoperative atrial fibrillation/flutter on 1/24, which was a new finding.  Assessment & Plan    # Atrial fibrillation/flutter: He had an 8 hour episode of atrial flutter alternating with atrial fibrillation.  He converted to sinus rhythm spontaneously.  His beta blocker was increased.  Plan for 30 day event monitor on discharge from CIR. Heart rates have been stable  here.  No plan for long term anticoagulation any unless he has recurrence.   # CAD: # Chest pain: CABG in 2003.  He had a Myoview in 2019 with inferior infarct with peri-infarct ischemia.  Continue aspirin and atorvastatin.  Chest pain last week, troponins not consistent with ACS (34>29)  # PAD: Status post right AKA this admission.  He was offered left foot amputation but declined.  Continue aspirin and atorvastatin.  Continue metoprolol.  # Acute on chronic diastolic heart failure: #  Hypertension: # Hyponatremia: Volume status is challenging to assess.  He previously developed edema after stopping diuretics.  Furosemide and spironolactone were resumed at lower doses.  BNP was elevated.  His edema is improved with diuresis.  He has chronic hyponatremia and has been treated with salt tablets.  This likely worsens his edema though it was helpful when his Na was <120 in the past.  If sodium is less than 120 again, would consider a dose of tolvaptan. Spironolactone discontinued and currently on lasix 40 mg daily.   [facility] will sign off.   Medication Recommendations:  Continue current meds, would continue PO lasix 40 mg daily Other recommendations (labs, testing, etc):  Check BMET within 1 week.  Plan outpatient 30 day monitor Follow up as an outpatient:  Will schedule    For questions or updates, please contact [place] Please consult [email or web address] for contact info under        Signed, Donato Heinz, MD  02/16/2020, 10:22 AM

## 2020-02-16 NOTE — Progress Notes (Signed)
CARDIOLOGY OFFICE NOTE  Date:  03/01/2020    [name] Date of Birth: 03/27/1929 Medical Record #[id number]  PCP:  [name], MD  Cardiologist:  Tamala Julian  Chief Complaint  Patient presents with  . Follow-up    History of Present Illness: [name] is a 87 y.o. male who presents today for a post hospital visit. Seen for Dr.Smith.   He has a history of known CAD s/p CABG, severe COPD with cor pulmonale, HTN, HLD, PAF, PAD and carotid artery disease.  Echocardiogram obtained on 03/09/2017 showed EF 60 to 65%, grade 1 DD mild TR, peak PA pressure 24 mmHg.   He has had priorischemicright rightfoot that has led to amputation back in August of 2021.   He has been very reluctant to consider Hospice services despite his dire situation and multiple .   Last seen by Dr. Tamala Julian in October.   More recently admitted late last month with cellulitis of the left foot - had had to vascular surgery revision of the right AKA as well. He had atrial flutter/fib with RVR - not a candidate for long term DOAC - apparently 30 day monitor was to be planned - this has not been done. Troponins were mildly positive - felt top be related to his tachycardia - no further evaluation planned. He has chronic respiratory failure. He was sent to rehab. HGB down to 6.6 and was transfused. Noted discussion again that there are no options for revascularization of the left lower extremity.   Comes in today. Here with his wife and his son today (MD - radiologist in [place]). [name] is in a wheelchair. Remains tangential in his thoughts. Breathing seems stable. Swelling of the left leg - no skin breakdown reported. He is in an orthopedic boot. No chest pain. He is questioning his medicines, why he can't get better, what the goals are, etc. Son wishes for Hospice to intervene and stop outpatient visits with goal of comfort/safety and to be kept at home. [name] does wish to die at home. He feels foggy at times.  He wonders if he should be on Aldactone. He is very sedentary - not able to walk. He is quite fearful that he will lose his left leg. They all understand that there are no options for revascularization. He had labs on the 2/11 which are noted.   Past Medical History:  Diagnosis Date  . Anxiety   . Arthritis    "generalized" (04/04/2017)  . Asthma   . Basal cell carcinoma    "left ear"  . BPH (benign prostatic hyperplasia)    severe; s/p multiple biopsies  . CAD (coronary artery disease)   . Carotid artery occlusion   . Chronic respiratory failure ([facility])   . Chronic rhinitis   . COPD (chronic obstructive pulmonary disease) ([facility])    2L [place] O2  . Diastolic heart failure ([facility]) 2019  . Dilation of biliary tract   . Elevated troponin 03/09/2017  . Gallstones   . GERD (gastroesophageal reflux disease)   . History of blood transfusion    "w/his CABG" (04/04/2017)  . History of kidney stones   . Hyperlipidemia   . Hypertension   . On home oxygen therapy    "~ 24/7" (04/04/2017)  . Peripheral vascular disease ([facility])   . Pneumonia 2019  . Squamous cell carcinoma of skin 04/23/2013   in situ-Right flank (txpbx)  . Squamous cell carcinoma of skin 01/23/2017  [place]/ in siut- Left ear rim (CX3FU)  . Syncope and collapse   . Ureteral tumor 08/2015   had endoscopic procedure for evaluation, unable to reach for biopsy  . Urinary catheter (Foley) change required     Past Surgical History:  Procedure Laterality Date  . ABDOMINAL AORTOGRAM W/LOWER EXTREMITY Bilateral 06/30/2019   Procedure: ABDOMINAL AORTOGRAM W/LOWER EXTREMITY;  Surgeon: Waynetta Sandy, MD;  Location: [facility];  Service: Cardiovascular;  Laterality: Bilateral;  . AMPUTATION Right 08/11/2019   Procedure: RIGHT ABOVE KNEE AMPUTATION;  Surgeon: Angelia Mould, MD;  Location: [facility];  Service: Vascular;  Laterality: Right;  . AMPUTATION Right 01/27/2020   Procedure: RIGHT AMPUTATION ABOVE KNEE REVISION;   Surgeon: Waynetta Sandy, MD;  Location: [facility];  Service: Vascular;  Laterality: Right;  . BASAL CELL CARCINOMA EXCISION Left    ear  . CARDIAC CATHETERIZATION     "prior to bypass"  . CATARACT EXTRACTION W/ INTRAOCULAR LENS  IMPLANT, BILATERAL Bilateral   . CORONARY ARTERY BYPASS GRAFT  10/2001   LIMA to LAD, SVG to OM1-2, SVG to RCA and PDA  . LITHOTRIPSY    . PROSTATE BIOPSY  Oct. 2014  . TONSILLECTOMY       Medications: Current Meds  Medication Sig  . acetaminophen (TYLENOL) 500 MG tablet Take 500 mg by mouth every 6 (six) hours as needed.  . ALPRAZolam (XANAX) 0.25 MG tablet Take 1 tablet (0.25 mg total) by mouth 3 (three) times daily as needed for anxiety.  Marland Kitchen aspirin 81 MG tablet Take 81 mg by mouth daily.  Marland Kitchen atorvastatin (LIPITOR) 40 MG tablet Take 1 tablet (40 mg total) by mouth at bedtime.  . calcium carbonate (TUMS - DOSED IN MG ELEMENTAL CALCIUM) 500 MG chewable tablet Chew 1 tablet (200 mg of elemental calcium total) by mouth 2 (two) times daily as needed for indigestion or heartburn.  . Cholecalciferol (VITAMIN D3) 50 MCG (2000 UT) TABS Take 2,000 Units by mouth daily with lunch.  . famotidine (PEPCID) 20 MG tablet Take 20 mg by mouth at bedtime as needed.  . finasteride (PROSCAR) 5 MG tablet Take 1 tablet (5 mg total) by mouth daily with lunch.  . fluticasone (FLONASE) 50 MCG/ACT nasal spray Place 2 sprays into both nostrils daily.  Marland Kitchen guaiFENesin (MUCINEX) 600 MG 12 hr tablet Take 1 tablet (600 mg total) by mouth 2 (two) times daily.  Marland Kitchen HYDROcodone-acetaminophen (NORCO/VICODIN) 5-325 MG tablet Take 1 tablet by mouth every 4 (four) hours as needed for moderate pain.  Marland Kitchen levalbuterol (XOPENEX HFA) 45 MCG/ACT inhaler Inhale 2 puffs into the lungs every 4 (four) hours as needed for wheezing or shortness of breath.  . levalbuterol (XOPENEX) 0.63 MG/3ML nebulizer solution Take 3 mLs (0.63 mg total) by nebulization every 4 (four) hours as needed for wheezing or shortness  of breath.  . metoprolol succinate (TOPROL-XL) 25 MG 24 hr tablet Take 1 tablet (25 mg total) by mouth daily.  . mirabegron ER (MYRBETRIQ) 50 MG TB24 tablet Take 50 mg by mouth daily.  . Multiple Vitamins-Minerals (PRESERVISION AREDS 2) CAPS Take 1 capsule by mouth daily.  Marland Kitchen NITROSTAT 0.4 MG SL tablet Place 0.4 mg under the tongue every 5 (five) minutes as needed for chest pain.  . OXYGEN Inhale 2 L/min into the lungs continuous.   . pantoprazole (PROTONIX) 40 MG tablet TAKE 1 TABLET(40 MG) BY MOUTH DAILY 30 TO 60 MINUTES BEFORE FIRST MEAL OF THE DAY  . Polyethyl Glycol-Propyl  Glycol (SYSTANE) 0.4-0.3 % SOLN Apply 1 drop to eye 2 (two) times daily as needed (eye irritation).  . polyethylene glycol (MIRALAX / GLYCOLAX) 17 g packet Take 17 g by mouth daily.  . predniSONE (DELTASONE) 10 MG tablet Take 1 tablet (10 mg total) by mouth daily.  . sodium chloride (MURO 128) 5 % ophthalmic solution Place 1 drop into both eyes 4 (four) times daily.  . SYMBICORT 160-4.5 MCG/ACT inhaler Inhale 2 puffs into the lungs 2 (two) times daily.  . Tiotropium Bromide Monohydrate (SPIRIVA RESPIMAT) 2.5 MCG/ACT AERS INHALE 2 PUFFS BY MOUTH EVERY DAY  . vitamin B-12 (CYANOCOBALAMIN) 1000 MCG tablet Take 1 tablet (1,000 mcg total) by mouth daily.  . [DISCONTINUED] furosemide (LASIX) 40 MG tablet Take 1 tablet (40 mg total) by mouth daily. (Patient taking differently: Take 40 mg by mouth daily. May take up to 80 mg as needed.)     Allergies: Allergies  Allergen Reactions  . Cefdinir Diarrhea and Other (See Comments)    Severe Diarrhea  . Nitrofurantoin Swelling and Other (See Comments)    Hand Swelling  . Sulfa Antibiotics Anaphylaxis and Swelling  . Sulfonamide Derivatives Swelling and Other (See Comments)    Facial/tongue swelling  . Tape Other (See Comments)    SKIN IS VERY THIN AND TEARS EASILY!!!!! Please do NOT use "plastic" tape- USE PAPER!!  . Ciprofloxacin Itching, Rash and Other (See Comments)    Red  itchy hands  . Nitrofurantoin Swelling and Other (See Comments)    Swollen hands  . Amoxicillin Er Other (See Comments)    Frequest urination. Pt reports "not really" allergic 01/24/20  . Doxycycline Other (See Comments)    "Felt terrible" Pt wife reports "not really" allergic 01/24/20  . Mirtazapine Other (See Comments)  . Other Other (See Comments)  . Ranolazine Other (See Comments)  . Levaquin [Levofloxacin] Itching and Rash  . Sertraline Anxiety and Other (See Comments)    Makes the patient jittery    Social History: The patient  reports that he quit smoking about 37 years ago. His smoking use included cigarettes. He has a 66.00 pack-year smoking history. He has never used smokeless tobacco. He reports current alcohol use. He reports that he does not use drugs.   Family History: The patient's family history includes Allergies in his brother; Breast cancer in his mother; Cancer in his mother; Heart attack in his father; Heart disease in his brother, brother, father, and mother; Hyperlipidemia in his brother; Hypertension in his brother and mother; Other in his mother; Peripheral vascular disease in his brother; Stroke in his brother.   Review of Systems: Please see the history of present illness.   All other systems are reviewed and negative.   Physical Exam: VS:  BP 120/60   Pulse 61   SpO2 99%  .  BMI There is no height or weight on file to calculate BMI.  Wt Readings from Last 3 Encounters:  02/16/20 121 lb 4.1 oz (55 kg)  02/05/20 116 lb 2.9 oz (52.7 kg)  11/20/19 134 lb (60.8 kg)    General: Elderly. Alert and in no acute distress. He looks chronically ill. He is in a wheelchair.  Cardiac: Regular rate and rhythm. Distant heart tones. 2+ edema of the left - high amputation on the right.   Respiratory:  Decreased breath sounds noted. He has oxygen in place.  MS: No deformity or atrophy. Gait not tested.  Skin: Warm and dry. Color is normal.  Neuro:  Strength and sensation  are intact and no gross focal deficits noted.  Psych: Alert, appropriate and with normal affect.   LABORATORY DATA:  EKG:  EKG is not ordered today.   Lab Results  Component Value Date   WBC 8.2 02/13/2020   HGB 8.9 (L) 02/13/2020   HCT 27.9 (L) 02/13/2020   PLT 307 02/13/2020   GLUCOSE 88 02/15/2020   CHOL 146 03/26/2017   TRIG 34 03/26/2017   HDL 79 03/26/2017   LDLCALC 60 03/26/2017   ALT 28 02/06/2020   AST 28 02/06/2020   NA 125 (L) 02/15/2020   K 3.8 02/15/2020   CL 84 (L) 02/15/2020   CREATININE 0.94 02/15/2020   BUN 21 02/15/2020   CO2 31 02/15/2020   TSH 4.920 (H) 09/22/2019   INR 1.0 08/11/2019   HGBA1C (H) 02/23/2010    5.7 (NOTE)                                                                       According to the ADA Clinical Practice Recommendations for 2011, when HbA1c is used as a screening test:   >=6.5%   Diagnostic of Diabetes Mellitus           (if abnormal result  is confirmed)  5.7-6.4%   Increased risk of developing Diabetes Mellitus  References:Diagnosis and Classification of Diabetes Mellitus,Diabetes [id number] 1):S62-S69 and Standards of Medical Care in         Diabetes - 2011,Diabetes Care,2011,34  (Suppl 1):S11-S61.         BNP (last 3 results) Recent Labs    02/10/20 0646  BNP 334.1*    ProBNP (last 3 results) No results for input(s): PROBNP in the last 8760 hours.   Other Studies Reviewed Today:  ECHO IMPRESSIONS 01/2020  1. Left ventricular ejection fraction, by estimation, is 60 to 65%. The  left ventricle has normal function. The left ventricle has no regional  wall motion abnormalities. There is mild concentric left ventricular  hypertrophy. Left ventricular diastolic  parameters are indeterminate.  2. Right ventricular systolic function is normal. The right ventricular  size is normal.  3. The mitral valve is normal in structure. No evidence of mitral valve  regurgitation. No evidence of mitral stenosis.   4. The aortic valve is tricuspid. There is mild calcification of the  aortic valve. There is mild thickening of the aortic valve. Aortic valve  regurgitation is not visualized. Mild aortic valve sclerosis is present,  with no evidence of aortic valve  stenosis.  5. The inferior vena cava is normal in size with greater than 50%  respiratory variability, suggesting right atrial pressure of 3 mmHg.      ASSESSMENT & PLAN:     1. CAD - no active chest pain - favor conservative management.   2. Cor pulmonale/COPD/chronic respiratory failure - chronically short of breath - has oxygen in place.   3. HTN - BP is fine.   4. PAD - prior AKA - has had revision -now concerns with the left leg - no options for any revascularization noted.   5. Left leg cellulitis/ulcer - ok to use Lasix - ok to take up to 80 mg a day if needed.   6.  PAF - in sinus - on higher dose of Toprol - would continue. No plans to to a monitor as noted from his discharge - he is not a candidate for anticoagulation - they have declined a monitor as well. We need to focus on comfort and safety.   7. Chronic diastolic HF - end stage.   8. HLD - not discussed.   9. Overall failure to thrive - have had multiple conversations in the past - he is willing to let Hospice come. Order placed. Goal is safety and comfort going forward. He is still currently a full code - this will need to be explored further. He has typically been very resistant to consider end of life care/discussions.   Current medicines are reviewed with the patient today.  The patient does not have concerns regarding medicines other than what has been noted above.  The following changes have been made:  See above.  Labs/ tests ordered today include:   No orders of the defined types were placed in this encounter.    Disposition:   FU with [place] prn. Hopefully he will let Hospice assume his care at home. Son, wife and Mr. Tiedt are currently in agreement with  this plan.    Patient is agreeable to this plan and will call if any problems develop in the interim.   SignedTruitt Merle, NP  03/01/2020 3:56 PM  [facility] [address] [place], [place]  [address] Phone: [phone] Fax: [phone]

## 2020-02-16 NOTE — Progress Notes (Signed)
Inpatient Rehabilitation Care Coordinator Discharge Note  The overall goal for the admission was met for:   Discharge location: Yes. D/c to home with 24/7 care from wife.   Length of Stay: Yes. 10 days.   Discharge activity level: Yes. Mod A.   Home/community participation: Yes. Limited.  Services provided included: MD, RD, PT, OT, RN, CM, TR, Pharmacy, Neuropsych and SW  Financial Services: Medicare and Private Insurance: Leisure centre manager offered to/list presented to: yes  Follow-up services arranged: Home Health: [facility] for HHPT/OT/SN, DME: [facility] for w/c and hospital bed and Patient/Family request agency HH: [facility] (resume services), DME: N/A  Comments (or additional information): N/A  Patient/Family verbalized understanding of follow-up arrangements: Yes  Individual responsible for coordination of the follow-up plan: Contact pt wife [name] [phone]  Confirmed correct DME delivered: Rana Snare 02/16/2020    Rana Snare

## 2020-02-16 NOTE — Consult Note (Signed)
   [facility] CM Inpatient Consult   02/16/2020  [name] 20-Dec-1929 [id number]  Follow up: Active member  Alerted [facility] RN Care Manager Coordinator of the patient transitioning home with home health today.  Natividad Brood, RN BSN [facility] Liaison  [phone] business mobile phone Toll free office [phone]  Fax number: [phone] [email or web address]

## 2020-02-16 NOTE — Progress Notes (Signed)
[facility] PHYSICAL MEDICINE & REHABILITATION PROGRESS NOTE  Subjective/Complaints: Pt feels tired this morning. Says he always feels tired in the AM, perhaps didn't sleep as well as usual. Notes that he's going home. Right stump pain  ROS: Patient denies fever, rash, sore throat, blurred vision, nausea, vomiting, diarrhea, cough, shortness of breath or chest pain,   headache, or mood change.    Objective: Vital Signs: Blood pressure (!) 107/54, pulse 79, temperature 98 F (36.7 C), resp. rate 15, height 5\' 8"  (1.727 m), weight 55 kg, SpO2 97 %. No results found. No results for input(s): WBC, HGB, HCT, PLT in the last 72 hours. Recent Labs    02/14/20 0512 02/15/20 0635  NA 125* 125*  K 3.9 3.8  CL 86* 84*  CO2 32 31  GLUCOSE 88 88  BUN 17 21  CREATININE 0.87 0.94  CALCIUM 8.9 9.2    Intake/Output Summary (Last 24 hours) at 02/16/2020 4709 Last data filed at 02/16/2020 0431 Gross per 24 hour  Intake 460 ml  Output 1500 ml  Net -1040 ml     Pressure Injury 01/24/20 Coccyx Mid;Lower Stage 2 -  Partial thickness loss of dermis presenting as a shallow open injury with a red, pink wound bed without slough. (Active)  01/24/20 1445  Location: Coccyx  Location Orientation: Mid;Lower  Staging: Stage 2 -  Partial thickness loss of dermis presenting as a shallow open injury with a red, pink wound bed without slough.  Wound Description (Comments):   Present on Admission: Yes     Pressure Injury 02/06/20 Buttocks Left Stage 2 -  Partial thickness loss of dermis presenting as a shallow open injury with a red, pink wound bed without slough. Above previous wound (Active)  02/06/20 (noted by [facility] nurse on consult) 1459  Location: Buttocks  Location Orientation: Left  Staging: Stage 2 -  Partial thickness loss of dermis presenting as a shallow open injury with a red, pink wound bed without slough.  Wound Description (Comments): Above previous wound  Present on Admission: Yes     Physical Exam: BP (!) 107/54 (BP Location: Right Arm)   Pulse 79   Temp 98 F (36.7 C)   Resp 15   Ht 5\' 8"  (1.727 m)   Wt 55 kg   SpO2 97%   BMI 18.44 kg/m  Constitutional: No distress . Vital signs reviewed. Frail appearing HEENT: EOMI, oral membranes moist Neck: supple Cardiovascular: RRR without murmur. No JVD    Respiratory/Chest: CTA Bilaterally without wheezes or rales. Normal effort, O2 [place] GI/Abdomen: BS +, non-tender, non-distended Ext: no clubbing, cyanosis, or edema Psych: pleasant and cooperative Skin: Warm and dry.   Right AKA with sutures CDI hematoma improving,dressing in place Scattered ecchymosis Stage II buttock and coccyx ulcers Musc:  Right AKA with persistent edema and tenderness, improving Left lower extremity with significant edema, improving Neuro: Alert Very HOH Motor: Bilateral upper extremities: 5/5 proximal distal Right lower extremity: Hip flexion 3+/5 (pain inhibition), stable Left lower extremity: Hip flexion, knee extension, ankle dorsiflexion 4 -/5, stable  Assessment/Plan: 1. Functional deficits which require 3+ hours per day of interdisciplinary therapy in a comprehensive inpatient rehab setting.  Physiatrist is providing close team supervision and 24 hour management of active medical problems listed below.  Physiatrist and rehab team continue to assess barriers to discharge/monitor patient progress toward functional and medical goals   Care Tool:  Bathing    Body parts bathed by patient: Right arm,Left arm,Chest,Abdomen,Front perineal area,Buttocks,Left upper  leg,Face   Body parts bathed by helper: Left lower leg Body parts n/a: Right upper leg,Right lower leg   Bathing assist Assist Level: Minimal Assistance - Patient > 75%     Upper Body Dressing/Undressing Upper body dressing   What is the patient wearing?: Pull over shirt    Upper body assist Assist Level: Supervision/Verbal cueing    Lower Body  Dressing/Undressing Lower body dressing      What is the patient wearing?: Pants,Underwear/pull up     Lower body assist Assist for lower body dressing: Minimal Assistance - Patient > 75%     Toileting Toileting    Toileting assist Assist for toileting: Minimal Assistance - Patient > 75%     Transfers Chair/bed transfer  Transfers assist  Chair/bed transfer activity did not occur: Refused  Chair/bed transfer assist level: Supervision/Verbal cueing     Locomotion Ambulation   Ambulation assist      Assist level: 2 helpers Assistive device: Parallel bars Max distance: 24ft   Walk 10 feet activity   Assist  Walk 10 feet activity did not occur: Safety/medical concerns (fatigue, generalized weakness, poor endurance)        Walk 50 feet activity   Assist Walk 50 feet with 2 turns activity did not occur: Safety/medical concerns (fatigue, generalized weakness, poor endurance)         Walk 150 feet activity   Assist Walk 150 feet activity did not occur: Safety/medical concerns (fatigue, generalized weakness, poor endurance)         Walk 10 feet on uneven surface  activity   Assist Walk 10 feet on uneven surfaces activity did not occur: Safety/medical concerns (fatigue, generalized weakness, poor endurance)         Wheelchair     Assist Will patient use wheelchair at discharge?: Yes Type of Wheelchair: Manual    Wheelchair assist level: Supervision/Verbal cueing Max wheelchair distance: 30ft    Wheelchair 50 feet with 2 turns activity    Assist    Wheelchair 50 feet with 2 turns activity did not occur: Refused   Assist Level: Supervision/Verbal cueing   Wheelchair 150 feet activity     Assist      Assist Level: Maximal Assistance - Patient 25 - 49%    Medical Problem List and Plan: 1.  Decreased functional mobility secondary to cellulitis left foot as well as right AKA revision 01/27/2020  Continue CIR 2.   Antithrombotics: -DVT/anticoagulation:Lovenox  on 30mg  BID, changed to daily              -antiplatelet therapy: Aspirin 81 mg daily 3. Pain Management: Tylenol prn per wife   Controlled on 2/7    4. Mood: Provide emotional support  Fluoxetine started on 1/28 for depression-discussed with neuropsychology, appreciate recs.             -antipsychotic agents: N/A 5. Neuropsych: This patient is?  Fully capable of making decisions on his own behalf. 6. Skin/Wound decubitus to coccyx and R AKA.  Skin care as directed.  Routine skin checks- dressing to be changed BID and prn  Monitor wounds 7. Fluids/Electrolytes/Nutrition: Routine in and outs. 8.  ID/cellulitis left foot.  MRSA screen negative.  Blood cultures no growth to date.    Completed Augmentin on 02/05/2020 9.  Atrial flutter/A. fib with RVR.  Toprol 25 mg daily.  Not a candidate for long-term DOAC.  30-day event monitor as outpatient.  Follow-up cardiology services  Controlled on 2/6, appreciate cards recs  Monitor with increased activity 10.  Hyponatremia.  Lasix and Aldactone resumed at lower doses.    Sodium 125 on 2/6--stable  Continue sodium chloride tabs, resumed on 1/31 11.  Chronic diastolic congestive heart failure.    Lasix and Aldactone per cardiology-appreciate recs  Followed by cardiology services  Monitor for any signs of fluid overload Filed Weights   02/14/20 0501 02/15/20 0443 02/16/20 0430  Weight: 53 kg 55.2 kg 55 kg   ? Reliability. weight at [place].  Chronic respiratory failure with hypoxia/COPD.  Chronic oxygen therapy 2 L.  Continue chronic prednisone. 13.  Chronic urinary retention.  Chronic Foley tube followed by urology service [name].    Continue Myrbetriq as well as Proscar.    Change Foley catheter to routinely- last changed 01/16/2020  Foley exchanged on 2/3, continue at dc 14.  GERD.  Protonix 15.  Chronic sacral decubitus.  Santyl ointment twice daily.   See #6 16.  Hypothyroidism:  Synthroid 17.  Normocytic anemia.  Transfused 1 unit packed red blood cells 01/31/2020.    Hemoglobin 8.9 on 2/4 19.  Hx constipation improved now with cont stool 20.  Prediabetes  Controlled on 2/7  Monitor with increased mobility   LOS: 11 days A FACE TO [place] 02/16/2020, 9:22 AM

## 2020-02-17 ENCOUNTER — Other Ambulatory Visit: Payer: Self-pay

## 2020-02-17 ENCOUNTER — Other Ambulatory Visit: Payer: Medicare Other | Admitting: Nurse Practitioner

## 2020-02-17 ENCOUNTER — Telehealth: Payer: Self-pay

## 2020-02-17 DIAGNOSIS — Z89619 Acquired absence of unspecified leg above knee: Secondary | ICD-10-CM | POA: Diagnosis not present

## 2020-02-17 DIAGNOSIS — Z515 Encounter for palliative care: Secondary | ICD-10-CM

## 2020-02-17 NOTE — Progress Notes (Signed)
[facility] Consult Note Telephone: [phone]  Fax: [phone]  PATIENT NAME: [name] [address] [place] [address] [phone] (home)  DOB: 07-Apr-1929 MRN: [id number]  PRIMARY CARE PROVIDER:    [name], MD,  [place] [address] [phone]  REFERRING PROVIDER:   [name], [place],  [place] [address] [phone]  RESPONSIBLE PARTY:   Extended Emergency Contact Information Primary Emergency Contact: Caponi,Jacqueline Address: [place], [place] Mobile Phone: [phone] Relation: Spouse Secondary Emergency Contact: Ghazarian,Eric  [place] Mobile Phone: [phone] Relation: Son  I met face to face with patient and family in home/facility. Patient present but unable to substantively engage in process.   ASSESSMENT AND RECOMMENDATIONS:   Advance Care Planning: Goal of care: Goal is for patient to be pain free. Directives: Patient desires CPR in the event of cardiac arrest, he however does not desire intubation. Patient's wife verbalized desire for Hospice care if patient condition deteriorates or does not improving, saying patient want to have a fighting chance. Patient and wife would like to revisit the discusssion at another time. Palliative care will continue to provide support to patient, family and the medical team.  Symptom Management:  Wound : Patient was discharged from hospital yesterday, wife concerned that she is not getting response from [facility]. She report not knowing what to do with patient dressings. Patient has not reinstated personal care services with [facility], wife planning to call the company. Plan: Phone call made to [facility], spoke with Otila Kluver. Made aware that patient's services will be started tomorrow. Patient will be  receiving services which includes Physical therapy, Occupational therapy and nursing care. Encouraged adequate nutrition to promote wound healing, encouraged healthy meal choices from a variety of food groups. Pain: Chronic pain, report joint pain and bladder spasm. Report taking Tylenol 3-4 tmes a day. Report 5/10 pain today, denied bladder spasm. Patient has Prescription for Hydrocodone-Acetaminophen 5-33m which family report will be picked up later this afternoon.  Recommendation: Patient encouraged to take pain med as prescribed, patient to take Hydrocodone-Acetaminophen after therapy session if concerns of sedation. Advised not to take more than 30033mof Acetaminophen containing medication within a 24hr period. Patient and wife verbalized understanding.  Follow up Palliative Care Visit: Palliative care will continue to follow for goals of care clarification and symptom management. Return as needed.  Family /Caregiver/Community Supports: Patient lives at home with wife. Has daughter that live next door and assist in his care as able.  Cognitive / Functional decline: Patient awake, alert and coherent, he is however hard of hearing. He is currently dependent on wife for his ADLs, able to feed self. Wheelchair dependent for ambulation.  I spent 25 minutes providing this consultation, time includes time spent with patient and family, chart review, provider coordination, and documentation. More than 50% of the time in this consultation was spent counseling and coordinating communication.   CHIEF COMPLAINT: Wound care  History obtained from review of EMR and interview with wife and family. Records reviewed and summarized bellow.  HISTORY OF PRESENT ILLNESS:  DaBRANKO STEEVESs a [age].o. year old male with multiple medical problems including chronic diastolic congestive heart failure, major depression, paroxysmal atrial fibrillation, foley cath use secondary to urinary retention, COPD on continuous  supplemental oxygen use.  Patient is followed by [facility] team. He is s/p hospitalization from 02/05/2020 to 02/16/2020 for dehiscing of site of right above the knee amputation site. Palliative Care was asked to help with symptom management. This is a follow up visit from from [facility] team.  CODE STATUS: Patient is DNR, desires CPR but not intubation  PPS: 40%  HOSPICE ELIGIBILITY/DIAGNOSIS: Patient had Hospice admission visit in the past, he declined services  PHYSICAL EXAM / ROS:   General: chronically ill and frail appearing, sitting on side of his bed eating breakfast Cardiovascular: denied chest pain, denied palpitation, +3 edema to left lower leg Pulmonary: no acute cough, no increased SOB, supplemental oxygen at 2L, sats 95% GI: no swallowing issues, appetite fair, denied constipation, continent of bowel GU: foley cath intact, urine clear and yellow MSK:  Right above the knee amputation, dressing intact Skin: stage 2 sacral wound, dressing intact Neurological: Weakness, but otherwise nonfocal Psych: non -anxious affect  PAST MEDICAL HISTORY:  Past Medical History:  Diagnosis Date  . Anxiety   . Arthritis    "generalized" (04/04/2017)  . Asthma   . Basal cell carcinoma    "left ear"  . BPH (benign prostatic hyperplasia)    severe; s/p multiple biopsies  . CAD (coronary artery disease)   . Carotid artery occlusion   . Chronic respiratory failure ([facility])   . Chronic rhinitis   . COPD (chronic obstructive pulmonary disease) (HCC)    2L [place] O2  . Diastolic heart failure ([facility]) 2019  . Dilation of biliary tract   . Elevated troponin 03/09/2017  . Gallstones   . GERD (gastroesophageal reflux disease)   . History of blood transfusion    "w/his CABG" (04/04/2017)  . History of kidney stones   . Hyperlipidemia   . Hypertension   . On home oxygen therapy    "~ 24/7" (04/04/2017)  . Peripheral vascular disease ([facility])   . Pneumonia 2019  . Squamous cell carcinoma of skin 04/23/2013   in  situ-Right flank (txpbx)  . Squamous cell carcinoma of skin 01/23/2017   [place]/ in siut- Left ear rim (CX3FU)  . Syncope and collapse   . Ureteral tumor 08/2015   had endoscopic procedure for evaluation, unable to reach for biopsy  . Urinary catheter (Foley) change required     SOCIAL HX:  Social History   Tobacco Use  . Smoking status: Former Smoker    Packs/day: 2.00    Years: 33.00    Pack years: 66.00    Types: Cigarettes    Quit date: 03/25/1982    Years since quitting: 37.9  . Smokeless tobacco: Never Used  Substance Use Topics  . Alcohol use: Yes    Alcohol/week: 0.0 standard drinks    Comment: 1.5-3 oz daily in the past, minimal use in the last few months   FAMILY HX:  Family History  Problem Relation Age of Onset  . Heart disease Mother   . Heart disease Father        Before age 7  . Breast cancer Mother   . Cancer Mother        Breast cancer  . Hypertension Mother   . Other Mother        AAA  and   Amputation  . Heart attack Father   . Stroke Brother        x3, still living   . Peripheral vascular disease Brother   . Heart disease Brother   . Hyperlipidemia Brother   .  Hypertension Brother   . Heart disease Brother   . Allergies Brother     ALLERGIES:  Allergies  Allergen Reactions  . Cefdinir Diarrhea and Other (See Comments)    Severe Diarrhea  . Nitrofurantoin Swelling and Other (See Comments)    Hand Swelling  . Sulfa Antibiotics Anaphylaxis and Swelling  . Sulfonamide Derivatives Swelling and Other (See Comments)    Facial/tongue swelling  . Tape Other (See Comments)    SKIN IS VERY THIN AND TEARS EASILY!!!!! Please do NOT use "plastic" tape- USE PAPER!!  . Ciprofloxacin Itching, Rash and Other (See Comments)    Red itchy hands  . Nitrofurantoin Swelling and Other (See Comments)    Swollen hands  . Amoxicillin Er Other (See Comments)    Frequest urination. Pt reports "not really" allergic 01/24/20  . Doxycycline Other (See Comments)     "Felt terrible" Pt wife reports "not really" allergic 01/24/20  . Levaquin [Levofloxacin] Itching and Rash  . Sertraline Anxiety and Other (See Comments)    Makes the patient jittery     PERTINENT MEDICATIONS:  Outpatient Encounter Medications as of 02/17/2020  Medication Sig  . acetaminophen (TYLENOL) 500 MG tablet Take 500 mg by mouth every 6 (six) hours as needed.  . ALPRAZolam (XANAX) 0.25 MG tablet Take 1 tablet (0.25 mg total) by mouth 3 (three) times daily as needed for anxiety.  Marland Kitchen aspirin 81 MG tablet Take 81 mg by mouth daily.  Marland Kitchen atorvastatin (LIPITOR) 40 MG tablet Take 1 tablet (40 mg total) by mouth at bedtime.  . calcium carbonate (TUMS - DOSED IN MG ELEMENTAL CALCIUM) 500 MG chewable tablet Chew 1 tablet (200 mg of elemental calcium total) by mouth 2 (two) times daily as needed for indigestion or heartburn.  . Cholecalciferol (VITAMIN D3) 50 MCG (2000 UT) TABS Take 2,000 Units by mouth daily with lunch.  . famotidine (PEPCID) 20 MG tablet Take 20 mg by mouth at bedtime as needed.  . finasteride (PROSCAR) 5 MG tablet Take 1 tablet (5 mg total) by mouth daily with lunch.  Marland Kitchen FLUoxetine (PROZAC) 10 MG capsule Take 1 capsule (10 mg total) by mouth daily.  . fluticasone (FLONASE) 50 MCG/ACT nasal spray Place 2 sprays into both nostrils daily.  . furosemide (LASIX) 40 MG tablet Take 1 tablet (40 mg total) by mouth daily.  Marland Kitchen guaiFENesin (MUCINEX) 600 MG 12 hr tablet Take 1 tablet (600 mg total) by mouth 2 (two) times daily.  Marland Kitchen HYDROcodone-acetaminophen (NORCO/VICODIN) 5-325 MG tablet Take 1 tablet by mouth every 4 (four) hours as needed for moderate pain.  Marland Kitchen levalbuterol (XOPENEX HFA) 45 MCG/ACT inhaler Inhale 2 puffs into the lungs every 4 (four) hours as needed for wheezing or shortness of breath.  . levalbuterol (XOPENEX) 0.63 MG/3ML nebulizer solution Take 3 mLs (0.63 mg total) by nebulization every 4 (four) hours as needed for wheezing or shortness of breath.  . levothyroxine  (SYNTHROID) 50 MCG tablet Take 1 tablet (50 mcg total) by mouth daily.  . metoprolol succinate (TOPROL-XL) 25 MG 24 hr tablet Take 1 tablet (25 mg total) by mouth daily.  . Multiple Vitamins-Minerals (PRESERVISION AREDS 2) CAPS Take 1 capsule by mouth daily.  Marland Kitchen MYRBETRIQ 25 MG TB24 tablet Take 1 tablet (25 mg total) by mouth daily.  Marland Kitchen NITROSTAT 0.4 MG SL tablet Place 0.4 mg under the tongue every 5 (five) minutes as needed for chest pain.  . OXYGEN Inhale 2 L/min into the lungs continuous.   . pantoprazole (  PROTONIX) 40 MG tablet TAKE 1 TABLET(40 MG) BY MOUTH DAILY 30 TO 60 MINUTES BEFORE FIRST MEAL OF THE DAY  . Polyethyl Glycol-Propyl Glycol (SYSTANE) 0.4-0.3 % SOLN Apply 1 drop to eye 2 (two) times daily as needed (eye irritation).  . polyethylene glycol (MIRALAX / GLYCOLAX) 17 g packet Take 17 g by mouth daily. (Patient not taking: No sig reported)  . predniSONE (DELTASONE) 10 MG tablet Take 1 tablet (10 mg total) by mouth daily.  Marland Kitchen senna-docusate (SENOKOT-S) 8.6-50 MG tablet Take 1 tablet by mouth 2 (two) times daily.  . sodium chloride (MURO 128) 5 % ophthalmic solution Place 1 drop into both eyes 4 (four) times daily.  . sodium chloride 1 g tablet Take 1 tablet (1 g total) by mouth 2 (two) times daily with a meal.  . SYMBICORT 160-4.5 MCG/ACT inhaler Inhale 2 puffs into the lungs 2 (two) times daily.  . Tiotropium Bromide Monohydrate (SPIRIVA RESPIMAT) 2.5 MCG/ACT AERS INHALE 2 PUFFS BY MOUTH EVERY DAY (Patient taking differently: Inhale 2 puffs into the lungs daily.)  . vitamin B-12 (CYANOCOBALAMIN) 1000 MCG tablet Take 1 tablet (1,000 mcg total) by mouth daily.   No facility-administered encounter medications on file as of 02/17/2020.    Thank you for the opportunity to participate in the care of Mr. Shaden Lacher. The palliative care team will continue to follow. Please call our office at [phone] if we can be of additional assistance.  Jari Favre, DNP, AGPCNP-BC

## 2020-02-17 NOTE — Telephone Encounter (Signed)
Spoke with patient's wife [name] and scheduled an in-person Palliative Consult for 02/17/20 @ 11:30AM.  COVID screening was negative. No pets in home. Patient lives with wife.  Consent obtained; updated Outlook/Netsmart/Team List and Epic.  Family is aware they will be receiving a call from NP the day before or day of to confirm appointment.

## 2020-02-17 NOTE — Telephone Encounter (Signed)
Called and spoke with pts wife and she stated that the pt is home from the hospital now and had to have another amputation done.  She stated that she did not want to send him to rehab, so she is trying to care for him at home.  He is more weak this time and having some SHOB.  televisit has been scheduled for the pt for tomorrow per pts wife request with MW.

## 2020-02-18 ENCOUNTER — Other Ambulatory Visit: Payer: Self-pay | Admitting: *Deleted

## 2020-02-18 ENCOUNTER — Encounter: Payer: Self-pay | Admitting: Internal Medicine

## 2020-02-18 ENCOUNTER — Other Ambulatory Visit: Payer: Self-pay

## 2020-02-18 ENCOUNTER — Ambulatory Visit (INDEPENDENT_AMBULATORY_CARE_PROVIDER_SITE_OTHER): Payer: Medicare Other | Admitting: Internal Medicine

## 2020-02-18 DIAGNOSIS — L89152 Pressure ulcer of sacral region, stage 2: Secondary | ICD-10-CM | POA: Diagnosis not present

## 2020-02-18 DIAGNOSIS — J9612 Chronic respiratory failure with hypercapnia: Secondary | ICD-10-CM

## 2020-02-18 DIAGNOSIS — J449 Chronic obstructive pulmonary disease, unspecified: Secondary | ICD-10-CM

## 2020-02-18 DIAGNOSIS — I5032 Chronic diastolic (congestive) heart failure: Secondary | ICD-10-CM | POA: Diagnosis not present

## 2020-02-18 DIAGNOSIS — J9611 Chronic respiratory failure with hypoxia: Secondary | ICD-10-CM

## 2020-02-18 DIAGNOSIS — I480 Paroxysmal atrial fibrillation: Secondary | ICD-10-CM | POA: Diagnosis not present

## 2020-02-18 DIAGNOSIS — I739 Peripheral vascular disease, unspecified: Secondary | ICD-10-CM | POA: Diagnosis not present

## 2020-02-18 DIAGNOSIS — I110 Hypertensive heart disease with heart failure: Secondary | ICD-10-CM | POA: Diagnosis not present

## 2020-02-18 NOTE — Assessment & Plan Note (Signed)
Quit smoking 1984/ MM - PFTs  05/04/05 FEV1 36% ratio 28% with 29% improvement after bronchodilators DLCO 48%  - PFT's 03/26/08 30% ratio 34 with 14% improvement after bronchodilators DLC0 38 %  - Referred to rehab 12/19/2012 > completed March 2015  - changed to spiriva respimat 02/14/2013  - started daily prednisone 06/19/13 > improved only a little> tapered off mid July 2015 > ok to change to prn prednisone 08/27/2013  - flared off nasonex and gerd rx 09/21/2014 > resumed - 10/19/2014  p extensive coaching HFA effectiveness =    90%  - changed pred to ceiling of 20 / floor of 5 mg daily as of 10/19/2014 > changed to floor of 10 mg daily 10/04/2015  - PFT's  06/08/2015  FEV1 0.58 (22 % ) ratio 27  p No sign % improvement from saba p symb/spiriva prior to study with DLCO  9/9 % corrects to 27 % for alv volume   - changed pred to ceiling of 20 and floor of 5 mg daily 06/13/16 > increased floor to 10 mg daily effective 02/20/2017  - referred back to rehab 09/13/2016  - try off noct vent and start titrating up xanax 01/11/2017 > no change but then admitted with flu and placed back on vent  - alpha one AT 03/01/17  MM      - 11/09/2017 COPD exacerbation. No improvement with prn zpack. CXR clear. Given course doxy and pred taper. Resp therapy home eval/treat  - Spirometry 09/16/2018  FEV1 0.60  (23%)  Ratio 0.33 at [facility]  - Added zmax 250 mg daily 07/23/2019 >>> no better so changed back to zpak prn purulent sputum 11/06/2019    Group D in terms of symptom/risk and laba/lama/ICS  therefore appropriate rx at this point >>>  endstage dz / steroid dep  The goal with a chronic steroid dependent illness is always arriving at the lowest effective dose that controls the disease/symptoms and not accepting a set "formula" which is based on statistics or guidelines that don't always take into account patient  variability or the natural hx of the dz in every individual patient, which may well vary over time.  For now therefore I  recommend the patient maintain pred 20 mg until better then 10 mg   Re saba: I spent extra time with pt today reviewing appropriate use of albuterol for prn use on exertion with the following points: 1) saba is for relief of sob that does not improve by walking a slower pace or resting but rather if the pt does not improve after trying this first. 2) If the pt is convinced, as many are, that saba helps recover from activity faster then it's easy to tell if this is the case by re-challenging : ie stop, take the inhaler, then p 5 minutes try the exact same activity (intensity of workload) that just caused the symptoms and see if they are substantially diminished or not after saba 3) if there is an activity that reproducibly causes the symptoms, try the saba 15 min before the activity on alternate days   If in fact the saba really does help, then fine to continue to use it prn but advised may need to look closer at the maintenance regimen being used to achieve better control of airways disease with exertion.   >>> if hfa not helping, advised to use neb as plan C see avs for instructions unique to this ov

## 2020-02-18 NOTE — Progress Notes (Signed)
Subjective:     Patient ID: [name], male    DOB: [birth year]   MRN: [id number]   Brief patient profile:  62   yowm MM quit smoking in 1984 with GOLD IV/ 02 dep and steroid dep copd place on NIV by Dr Larkin Ina with profound chronic FTT not improved on NIV    History of Present Illness  05/25/2017  f/u ov/[name] re:  [name] iv copd/ 02 dep  Chief Complaint  Patient presents with  . Follow-up    Increased SOB and  fatigue x last week, 2.5L pulsed, compliant with spiriva, symbicort, and xopenex.   Dyspnea:  At rest/ worse walking 10 ft  Cough: none Sleep: on trilogy ok SABA use:  Confused with saba hfa vs neb  rec Add aldactone 50 mg daily  Continue triegy at your volition Ok to adjust xanax (alprazolam) from one-half to one three times a day to calm your breathing down and relieve anxiety related to breathlessness       09/23/2018  f/u ov/[name] re:  GOLD IV copd/ chronic resp failure, noct NIV dep / pred at 10 mg daily  Chief Complaint  Patient presents with  . Follow-up    F/U per patient for increased SOB and fatigue for the past month. Increased post nasal drip.   Dyspnea:  MMRC4  = sob if tries to leave home or while getting dressed   Cough: no Sleeping: on trilogy flat position  SABA use: about twice a day hfa xoepenex / neb works better and rec by [facility] pulmonary to try perform/bud instead of symbicort  02: 2lpm 24/7  rec Ok to stop symbicort and replace with performist and budesonide when available  See calendar for specific medication instructions and bring it back for each and every office visit for every healthcare provider you see.  Without it,  you may not receive the best quality medical care that we feel you deserve.   02/11/2019  f/u ov/[name] re: COPD GOLD IV 02 dep/ pred at 10 mg daily floor, no better on 10 ceiling  Chief Complaint  Patient presents with  . Follow-up    Pt c/o increased SOB x 5 days. He also c/o sinus congestion. He has been noticing swelling in  both legs over the past month.    Dyspnea:  Across the room assoc with leg swelling worse since decreased aldactone per cards to 12.5 mg daily   Cough: none  Sleeping: on vent  SABA use: more  02: 2-3 lpm 24 / 7 adjusting according to how he feels rather than monitoring his pulse oximeter as recommended. rec Target 02 saturation is over 90% ,  No benefit to driving it higher Add pepcid 20 mg at bedtime Let cardiology know about your leg swelling - aldactone adjustments should be considered          05/19/2019  f/u ov/[name] re: GOLD IV/ chronic resp failure/ got both covid shots Chief Complaint  Patient presents with  . Follow-up    3 month f/u for COPD. States his sinuses have been bothering him for the past 3 weeks. Was given a zpak 3 weeks, it did not help.   Dyspnea: across room limited by R foot now in boot  Cough: none  Sleeping: difficult to do due to leg pain  SABA use: still using hfa xopenex / neb q 3-4 days 02: 2lpm not adjusting  rec Make sure you check your oxygen saturations at highest level of activity  to be sure it stays over 90%     07/22/2019  f/u ov/[name] re: endstage copd  Chief Complaint  Patient presents with  . Acute Visit    increased mucus production x 4 days- white/cream, colored sputum- took zpack recently.    Dyspnea:  At rest Cough: slt more vol/ discoloration/ better on zmax Sleeping: on vent  SABA use: doesn't really help 02: 2lpm 24/7  rec zpak prn   11/06/2019  f/u ov/[name] re:  endstage copd on pred 10 mg plus symb/spiriva  Chief Complaint  Patient presents with  . Follow-up    SOB  Dyspnea:  says worse but saba need less than usual  (doesn't really help) Cough: very weak/ dry sounding  Sleeping: on vent  SABA use: less  02: 2lpm 24/7  For nasty mucus >  zmax 250 mg 2 on 1st day and one daily x 4 days and stop. For worse breathing  > double prednisone to 20 mg daily then 10 mg daily once better  Pulmonary follow up is as needed > defer  other options to palliative care.   Virtual Visit via Telephone Note 02/18/2020   I connected with [name] on 02/18/20 at 5 pm  by telephone and verified that I am speaking with the correct person using two identifiers. Pt is at home and this call made from my office with no other participants    I discussed the limitations, risks, security and privacy concerns of performing an evaluation and management service by telephone and the availability of in person appointments. I also discussed with the patient that there may be a patient responsible charge related to this service. The patient expressed understanding and agreed to proceed.   History of Present Illness: Dyspnea: sob at rest and very weak s/p d/c from hospital 9/7  "no better at discharge)  Cough: none  Sleeping: bed is flat uses lots of pillows SABA use: xopenex helps some as hfa/ using neb "one a week" 02: 2lpm/ vent at hs     No obvious day to day or daytime variability or assoc excess/ purulent sputum or mucus plugs or hemoptysis or cp or chest tightness, subjective wheeze or overt sinus or hb symptoms.    Also denies any obvious fluctuation of symptoms with weather or environmental changes or other aggravating or alleviating factors except as outlined above.   Meds reviewed/ med reconciliation completed     Current Meds  Medication Sig  . acetaminophen (TYLENOL) 500 MG tablet Take 500 mg by mouth every 6 (six) hours as needed.  . ALPRAZolam (XANAX) 0.25 MG tablet Take 1 tablet (0.25 mg total) by mouth 3 (three) times daily as needed for anxiety.  Marland Kitchen aspirin 81 MG tablet Take 81 mg by mouth daily.  Marland Kitchen atorvastatin (LIPITOR) 40 MG tablet Take 1 tablet (40 mg total) by mouth at bedtime.  . calcium carbonate (TUMS - DOSED IN MG ELEMENTAL CALCIUM) 500 MG chewable tablet Chew 1 tablet (200 mg of elemental calcium total) by mouth 2 (two) times daily as needed for indigestion or heartburn.  . Cholecalciferol (VITAMIN D3) 50  MCG (2000 UT) TABS Take 2,000 Units by mouth daily with lunch.  . famotidine (PEPCID) 20 MG tablet Take 20 mg by mouth at bedtime as needed.  . finasteride (PROSCAR) 5 MG tablet Take 1 tablet (5 mg total) by mouth daily with lunch.  . fluticasone (FLONASE) 50 MCG/ACT nasal spray Place 2 sprays into both nostrils daily.  Marland Kitchen  furosemide (LASIX) 40 MG tablet Take 1 tablet (40 mg total) by mouth daily.  Marland Kitchen guaiFENesin (MUCINEX) 600 MG 12 hr tablet Take 1 tablet (600 mg total) by mouth 2 (two) times daily.  Marland Kitchen HYDROcodone-acetaminophen (NORCO/VICODIN) 5-325 MG tablet Take 1 tablet by mouth every 4 (four) hours as needed for moderate pain.  Marland Kitchen levalbuterol (XOPENEX HFA) 45 MCG/ACT inhaler Inhale 2 puffs into the lungs every 4 (four) hours as needed for wheezing or shortness of breath.  . levalbuterol (XOPENEX) 0.63 MG/3ML nebulizer solution Take 3 mLs (0.63 mg total) by nebulization every 4 (four) hours as needed for wheezing or shortness of breath.  . levothyroxine (SYNTHROID) 50 MCG tablet Take 1 tablet (50 mcg total) by mouth daily.  . metoprolol succinate (TOPROL-XL) 25 MG 24 hr tablet Take 1 tablet (25 mg total) by mouth daily.  . Multiple Vitamins-Minerals (PRESERVISION AREDS 2) CAPS Take 1 capsule by mouth daily.  Marland Kitchen MYRBETRIQ 25 MG TB24 tablet Take 1 tablet (25 mg total) by mouth daily.  Marland Kitchen NITROSTAT 0.4 MG SL tablet Place 0.4 mg under the tongue every 5 (five) minutes as needed for chest pain.  . OXYGEN Inhale 2 L/min into the lungs continuous.   . pantoprazole (PROTONIX) 40 MG tablet TAKE 1 TABLET(40 MG) BY MOUTH DAILY 30 TO 60 MINUTES BEFORE FIRST MEAL OF THE DAY  . Polyethyl Glycol-Propyl Glycol (SYSTANE) 0.4-0.3 % SOLN Apply 1 drop to eye 2 (two) times daily as needed (eye irritation).  . polyethylene glycol (MIRALAX / GLYCOLAX) 17 g packet Take 17 g by mouth daily.  . predniSONE (DELTASONE) 10 MG tablet Take 1 tablet (10 mg total) by mouth daily. (Patient taking differently: Take 10 mg by mouth  daily. Increases to two tablets as needed when having symptoms until better)  . sodium chloride (MURO 128) 5 % ophthalmic solution Place 1 drop into both eyes 4 (four) times daily.  . sodium chloride 1 g tablet Take 1 tablet (1 g total) by mouth 2 (two) times daily with a meal.  . SYMBICORT 160-4.5 MCG/ACT inhaler Inhale 2 puffs into the lungs 2 (two) times daily.  . Tiotropium Bromide Monohydrate (SPIRIVA RESPIMAT) 2.5 MCG/ACT AERS INHALE 2 PUFFS BY MOUTH EVERY DAY (Patient taking differently: Inhale 2 puffs into the lungs daily.)  . vitamin B-12 (CYANOCOBALAMIN) 1000 MCG tablet Take 1 tablet (1,000 mcg total) by mouth daily.         Observations/Objective: Speech is slt slurred and speaking in short phrases    Assessment and Plan: See problem list for active a/p's   Follow Up Instructions: See avs for instructions unique to this ov which includes revised/ updated med list     I discussed the assessment and treatment plan with the patient. The patient was provided an opportunity to ask questions and all were answered. The patient agreed with the plan and demonstrated an understanding of the instructions.   The patient was advised to call back or seek an in-person evaluation if the symptoms worsen or if the condition fails to improve as anticipated.  I provided 27 minutes of non-face-to-face time during this encounter.   Christinia Gully, MD

## 2020-02-18 NOTE — Assessment & Plan Note (Signed)
Started noct 02 at 2lpm 2008 and on 03/04/09 desat @ > 185 ft so rec wear with activtiy > rm to rm  - 02/15/2011   Walked RA x one lap @ 185 stopped due to  desat > corrected on 2lpm - 02/26/2014  Walked 2lpm  2 laps @ 185 ft each stopped due to  Sob/ sats 88% at nl pace  - 10/19/2014   Walked RA  2 laps @ 185 ft each stopped due to  End of study, slow pace,  sob  But no  desat   05/21/15 Eval by Dr Dedio/ rec trial of NIV  - HCO3  10/27/15 = 34  - [facility]  10/06/16  = 28 ? On noct vent (non compliant) - As of admit  02/04/17 back on noct vent/ 02 2lpm 24/7 - HC03 36  08/28/2017 despite reporting using noct vent  - [facility]03 31  01/04/18  - [facility]03 32   07/10/18  - [facility]03  29 12/20/18  - [facility]03  31  09/22/19  - [facility]03  32  02/14/20   Well compensated resp acidosis on home noct vent but he's been miserable for months so inclined to turn his care over to hospice at this point noting:  Though somewhat paradoxic, when the lung fails to clear C02 properly and pC02 rises the lung then becomes a more efficient scavenger of C02 allowing lower work of breathing and  better C02 clearance albeit at a higher serum pC02 level - this is why pts can look a lot better than their ABG's would suggest and why it's so difficult to prognosticate endstage dz.  It's also why I strongly rec DNI status (ventilating pts down to a nl pC02 adversely affects this compensatory mechanism)   >>> f/u prn

## 2020-02-18 NOTE — Patient Instructions (Signed)
Strongly recommend you call palliative care to have them refer you to hospice.  Adjust your 02 to a saturation to keep it in the low 90s  Plan A = Automatic = Always=    symbicort 160 / spiriva  2 puffs of each then another 2 pffs of symbicort in 12 hours   Plan B = Backup (to supplement plan A, not to replace it) Only use your albuterol inhaler as a rescue medication to be used if you can't catch your breath by resting or doing a relaxed purse lip breathing pattern.  - The less you use it, the better it will work when you need it. - Ok to use the inhaler up to 2 puffs  every 4 hours if you must but call for appointment if use goes up over your usual need - Don't leave home without it !!  (think of it like the spare tire for your car)   Plan C = Crisis (instead of Plan B but only if Plan B stops working) - only use your albuterol nebulizer if you first try Plan B and it fails to help > ok to use the nebulizer up to every 4 hours but if start needing it regularly call for immediate appointment   Plan D = Deltasone 10 mg  If  breathing worse and need more levoalbuterol then increase to 20 mg per day until better then back to 10 mg

## 2020-02-18 NOTE — Patient Outreach (Signed)
[name]) Care Management  02/18/2020  MIKEN STECHER 07/01/1929 [id number]   Noted that member was discharged home from inpatient rehab on 2/7 after having revision of his amputation.  He has follow up with vascular surgeon on 2/18 and with rehab physician on 2/24.  Wife state member has continued to decline, not doing very well.  She expresses frustration regarding inability for home health to start immediately, however she received call during outreach today and they will start with PT and RN today.  He also has virtual visit with pulmonology today due to increasing shortness of breath, will have in office visit with PCP next week and with cardiology on 2/21.  Noted that member had home visit from [facility] NP yesterday, again has refused hospice, will continue with palliative care.  She inquires about what else this care manager can offer, discussed support she already has and re-educated on [facility] benefits.  Does not feel [facility] can offer anything in addition to what she is already receiving, declines to have follow up at this time.  Will close case but advised wife to contact this care manager should needs change or with any questions.  Goals Addressed            This Visit's Progress   . COMPLETED: [facility] - Learn More About My Health       Follow Up Date 01/23/2020  Timeframe:  Long-Range Goal Priority:  Medium Start Date:            12/29/2019                 Expected End Date:    02/29/2020                     - make a list of questions - ask questions - speak up when I don't understand    Why is this important?   The best way to learn about your health and care is by talking to the doctor and nurse.  They will answer your questions and give you information in the way that you like best.    Notes:  Will report understanding of progression and recovery from amputation  12/20 - Referrals placed for CSW and Remote Health to encouraged support in the home and  counseling  2/9 - Case closed per wife request    . COMPLETED: [facility] - Make and Keep All Appointments       Follow Up Date 01/23/2020  Timeframe:  Short-Term Goal Priority:  High Start Date:       12/29/2019                      Expected End Date:    01/29/2020                     - call to cancel if needed - keep a calendar with appointment dates    Why is this important?   Part of staying healthy is seeing the doctor for follow-up care.  If you forget your appointments, there are some things you can do to stay on track.    Notes:  2/9 - Case closed per wife request   11/11 - Referral to CSW for transportation resources  12/20 - Reviewed upcoming appointments with wife      Valente Garlen, RN, MSN [place] Manager [phone]

## 2020-02-19 DIAGNOSIS — R609 Edema, unspecified: Secondary | ICD-10-CM | POA: Diagnosis not present

## 2020-02-19 DIAGNOSIS — N3289 Other specified disorders of bladder: Secondary | ICD-10-CM | POA: Diagnosis not present

## 2020-02-19 DIAGNOSIS — E871 Hypo-osmolality and hyponatremia: Secondary | ICD-10-CM | POA: Diagnosis not present

## 2020-02-19 DIAGNOSIS — L03116 Cellulitis of left lower limb: Secondary | ICD-10-CM | POA: Diagnosis not present

## 2020-02-19 DIAGNOSIS — Z09 Encounter for follow-up examination after completed treatment for conditions other than malignant neoplasm: Secondary | ICD-10-CM | POA: Diagnosis not present

## 2020-02-19 DIAGNOSIS — R531 Weakness: Secondary | ICD-10-CM | POA: Diagnosis not present

## 2020-02-23 ENCOUNTER — Telehealth: Payer: Self-pay | Admitting: Interventional Cardiology

## 2020-02-23 NOTE — Telephone Encounter (Signed)
Per wife, patient was in the hospital and was told he would need a heart monitor but whenever it came time to discharge him, she states that they were told he not be getting the heart monitor but he needs to go to Dr. Thompson Caul office to get it. She states that they did not do this but still ended up getting the heart monitor through the mail. She wants to know if this is really necessary considering his age, disability and the circumstances. She would like to know Dr. Thompson Caul opinion, please advise.

## 2020-02-23 NOTE — Telephone Encounter (Signed)
Will route to Dr. Smith for review.  

## 2020-02-24 ENCOUNTER — Telehealth: Payer: Self-pay | Admitting: Interventional Cardiology

## 2020-02-24 DIAGNOSIS — I5032 Chronic diastolic (congestive) heart failure: Secondary | ICD-10-CM | POA: Diagnosis not present

## 2020-02-24 DIAGNOSIS — I110 Hypertensive heart disease with heart failure: Secondary | ICD-10-CM | POA: Diagnosis not present

## 2020-02-24 DIAGNOSIS — I480 Paroxysmal atrial fibrillation: Secondary | ICD-10-CM | POA: Diagnosis not present

## 2020-02-24 DIAGNOSIS — J9611 Chronic respiratory failure with hypoxia: Secondary | ICD-10-CM | POA: Diagnosis not present

## 2020-02-24 DIAGNOSIS — L89152 Pressure ulcer of sacral region, stage 2: Secondary | ICD-10-CM | POA: Diagnosis not present

## 2020-02-24 DIAGNOSIS — I739 Peripheral vascular disease, unspecified: Secondary | ICD-10-CM | POA: Diagnosis not present

## 2020-02-24 NOTE — Telephone Encounter (Signed)
Pt c/o swelling: STAT is pt has developed SOB within 24 hours  1) How much weight have you gained and in what time span? Not able to stand to weigh due to being amputee, but was 27 cm today when measured.  2) If swelling, where is the swelling located? Left lower extremity   3) Are you currently taking a fluid pill? Yes   4) Are you currently SOB? Always has SOB   5) Do you have a log of your daily weights (if so, list)? No   6) Have you gained 3 pounds in a day or 5 pounds in a week? Possibly could, but unsure.    7) Have you traveled recently? No    States is she is unable to answer when calling back to reach out to pt's wife due to her going in to see a pt.

## 2020-02-24 NOTE — Telephone Encounter (Signed)
Spoke with wife.  She states pt has swelling in left leg daily.  Improves with elevation.  Not using compression stockings per instructions from Dr. Scot Dock.  Since discharge, wife has been giving pt Furosemide 40mg  in AM and 20mg  in PM.  BP has been fine.  Today's reading was 124/70.  Pt is trying to be as active as he can but is much weaker than when he left the hospital.  Wife mentions that pt currently has a foley cath in.  Has bladder spasms at night and urine comes out around catheter.  They spoke with Urology about this and they have increased his Myrbetriq from 25mg  to 50mg .  PCP drew labs last Tuesday but wife has not heard back on results.  She states they told her if they do not call, that means everything checked out fine.  Advised I will send to Dr. Tamala Julian for review and advisement.

## 2020-02-25 DIAGNOSIS — F419 Anxiety disorder, unspecified: Secondary | ICD-10-CM | POA: Diagnosis not present

## 2020-02-25 DIAGNOSIS — F324 Major depressive disorder, single episode, in partial remission: Secondary | ICD-10-CM | POA: Diagnosis not present

## 2020-02-25 DIAGNOSIS — I480 Paroxysmal atrial fibrillation: Secondary | ICD-10-CM | POA: Diagnosis not present

## 2020-02-25 DIAGNOSIS — R609 Edema, unspecified: Secondary | ICD-10-CM | POA: Diagnosis not present

## 2020-02-25 DIAGNOSIS — R531 Weakness: Secondary | ICD-10-CM | POA: Diagnosis not present

## 2020-02-25 DIAGNOSIS — S51819A Laceration without foreign body of unspecified forearm, initial encounter: Secondary | ICD-10-CM | POA: Diagnosis not present

## 2020-02-25 NOTE — Telephone Encounter (Signed)
   Cancel the 30 day monitor.  Sodium level gets too low on diuretic therapy. Would not recommend a change and certainly not an increase until labs are known.

## 2020-02-26 DIAGNOSIS — R3914 Feeling of incomplete bladder emptying: Secondary | ICD-10-CM | POA: Diagnosis not present

## 2020-02-26 NOTE — Telephone Encounter (Signed)
Spoke with wife and made her aware of recommendations from Dr. Tamala Julian.  Pt scheduled to see [name], NP on 2/21.  Wife has copy of recent labs from PCP that she will bring.  I have contacted PCP to obtain a copy.  Advised wife to have pt elevate leg as much as possible and we will see how he's doing on Monday.  Wife appreciative for call.

## 2020-02-26 NOTE — Telephone Encounter (Signed)
See phone note from 2/14

## 2020-02-27 ENCOUNTER — Ambulatory Visit (INDEPENDENT_AMBULATORY_CARE_PROVIDER_SITE_OTHER): Payer: Medicare Other | Admitting: Physician Assistant

## 2020-02-27 ENCOUNTER — Other Ambulatory Visit: Payer: Self-pay

## 2020-02-27 VITALS — BP 139/71 | HR 86 | Temp 98.7°F | Resp 20 | Ht 68.0 in

## 2020-02-27 DIAGNOSIS — M869 Osteomyelitis, unspecified: Secondary | ICD-10-CM | POA: Insufficient documentation

## 2020-02-27 DIAGNOSIS — R739 Hyperglycemia, unspecified: Secondary | ICD-10-CM | POA: Insufficient documentation

## 2020-02-27 DIAGNOSIS — I509 Heart failure, unspecified: Secondary | ICD-10-CM | POA: Insufficient documentation

## 2020-02-27 DIAGNOSIS — I739 Peripheral vascular disease, unspecified: Secondary | ICD-10-CM | POA: Diagnosis not present

## 2020-02-27 DIAGNOSIS — R1011 Right upper quadrant pain: Secondary | ICD-10-CM | POA: Insufficient documentation

## 2020-02-27 DIAGNOSIS — I480 Paroxysmal atrial fibrillation: Secondary | ICD-10-CM | POA: Diagnosis not present

## 2020-02-27 DIAGNOSIS — K5901 Slow transit constipation: Secondary | ICD-10-CM | POA: Insufficient documentation

## 2020-02-27 DIAGNOSIS — R202 Paresthesia of skin: Secondary | ICD-10-CM | POA: Insufficient documentation

## 2020-02-27 DIAGNOSIS — L97509 Non-pressure chronic ulcer of other part of unspecified foot with unspecified severity: Secondary | ICD-10-CM | POA: Insufficient documentation

## 2020-02-27 DIAGNOSIS — I110 Hypertensive heart disease with heart failure: Secondary | ICD-10-CM | POA: Diagnosis not present

## 2020-02-27 DIAGNOSIS — F324 Major depressive disorder, single episode, in partial remission: Secondary | ICD-10-CM | POA: Insufficient documentation

## 2020-02-27 DIAGNOSIS — R278 Other lack of coordination: Secondary | ICD-10-CM | POA: Insufficient documentation

## 2020-02-27 DIAGNOSIS — L89152 Pressure ulcer of sacral region, stage 2: Secondary | ICD-10-CM | POA: Diagnosis not present

## 2020-02-27 DIAGNOSIS — N3289 Other specified disorders of bladder: Secondary | ICD-10-CM | POA: Insufficient documentation

## 2020-02-27 DIAGNOSIS — E46 Unspecified protein-calorie malnutrition: Secondary | ICD-10-CM | POA: Insufficient documentation

## 2020-02-27 DIAGNOSIS — D500 Iron deficiency anemia secondary to blood loss (chronic): Secondary | ICD-10-CM | POA: Insufficient documentation

## 2020-02-27 DIAGNOSIS — E559 Vitamin D deficiency, unspecified: Secondary | ICD-10-CM | POA: Insufficient documentation

## 2020-02-27 DIAGNOSIS — Z89611 Acquired absence of right leg above knee: Secondary | ICD-10-CM

## 2020-02-27 DIAGNOSIS — I87399 Chronic venous hypertension (idiopathic) with other complications of unspecified lower extremity: Secondary | ICD-10-CM | POA: Insufficient documentation

## 2020-02-27 DIAGNOSIS — N139 Obstructive and reflux uropathy, unspecified: Secondary | ICD-10-CM | POA: Insufficient documentation

## 2020-02-27 DIAGNOSIS — R262 Difficulty in walking, not elsewhere classified: Secondary | ICD-10-CM | POA: Insufficient documentation

## 2020-02-27 DIAGNOSIS — R1013 Epigastric pain: Secondary | ICD-10-CM | POA: Insufficient documentation

## 2020-02-27 DIAGNOSIS — D509 Iron deficiency anemia, unspecified: Secondary | ICD-10-CM | POA: Insufficient documentation

## 2020-02-27 DIAGNOSIS — I5032 Chronic diastolic (congestive) heart failure: Secondary | ICD-10-CM | POA: Diagnosis not present

## 2020-02-27 DIAGNOSIS — S78111A Complete traumatic amputation at level between right hip and knee, initial encounter: Secondary | ICD-10-CM | POA: Insufficient documentation

## 2020-02-27 DIAGNOSIS — J9611 Chronic respiratory failure with hypoxia: Secondary | ICD-10-CM | POA: Diagnosis not present

## 2020-02-27 NOTE — Progress Notes (Signed)
    Postoperative Visit    History of Present Illness   NATANAEL SALADIN is a 87 y.o. male who presents for postoperative follow-up for right above-the-knee amputation (Date: 01/27/20) by Dr. Donzetta Matters.  Patient also has severe PAD and venous insufficiency of left lower extremity and has recurrent cellulitis episodes.  Given patient's advanced lung condition he is not a candidate for surgical revascularization of left lower extremity.  Patient states he is worried about right above-the-knee amputation incision however he denies any drainage or dehiscence.  Patient's wife is also present today.   For VQI Use Only   PRE-ADM LIVING: Home  AMB STATUS: Wheelchair   Physical Examination   Vitals:   02/27/20 1416  BP: 139/71  Pulse: 86  Resp: 20  Temp: 98.7 F (37.1 C)  SpO2: 100%    RLE: Stump incision is healed.  Staples are intact.   Medical Decision Making   KAREEM CATHEY is a 87 y.o. male who presents s/p right above-the-knee amputation.   Right above-the-knee amputation incision healed; staples removed and Steri strips applied  Prescription for limb shrinker was given  Dr. Donzetta Matters again reviewed with the patient that there is no options for revascularization of left lower extremity  Patient is active with home hospice and can follow-up as needed  Dagoberto Ligas PA-C [facility] Office: [facility] MD: Donzetta Matters

## 2020-03-01 ENCOUNTER — Other Ambulatory Visit: Payer: Self-pay | Admitting: *Deleted

## 2020-03-01 ENCOUNTER — Other Ambulatory Visit: Payer: Self-pay

## 2020-03-01 ENCOUNTER — Encounter: Payer: Self-pay | Admitting: Nurse Practitioner

## 2020-03-01 ENCOUNTER — Ambulatory Visit (INDEPENDENT_AMBULATORY_CARE_PROVIDER_SITE_OTHER): Payer: Medicare Other | Admitting: Nurse Practitioner

## 2020-03-01 VITALS — BP 120/60 | HR 61

## 2020-03-01 DIAGNOSIS — I25709 Atherosclerosis of coronary artery bypass graft(s), unspecified, with unspecified angina pectoris: Secondary | ICD-10-CM | POA: Diagnosis not present

## 2020-03-01 DIAGNOSIS — I2781 Cor pulmonale (chronic): Secondary | ICD-10-CM | POA: Diagnosis not present

## 2020-03-01 DIAGNOSIS — I739 Peripheral vascular disease, unspecified: Secondary | ICD-10-CM

## 2020-03-01 DIAGNOSIS — I509 Heart failure, unspecified: Secondary | ICD-10-CM

## 2020-03-01 DIAGNOSIS — I5033 Acute on chronic diastolic (congestive) heart failure: Secondary | ICD-10-CM

## 2020-03-01 DIAGNOSIS — I480 Paroxysmal atrial fibrillation: Secondary | ICD-10-CM | POA: Diagnosis not present

## 2020-03-01 DIAGNOSIS — I5022 Chronic systolic (congestive) heart failure: Secondary | ICD-10-CM

## 2020-03-01 DIAGNOSIS — I5041 Acute combined systolic (congestive) and diastolic (congestive) heart failure: Secondary | ICD-10-CM

## 2020-03-01 MED ORDER — FUROSEMIDE 40 MG PO TABS: 40.0000 mg | ORAL_TABLET | Freq: Every day | ORAL | Status: DC

## 2020-03-01 NOTE — Patient Instructions (Addendum)
After Visit Summary:  We will be checking the following labs today - NONE   Medication Instructions:    Continue with your current medicines.   Stay on the Lasix 40 mg in the AM and 20 mg in the PM - it is ok to take extra Lasix - even up to 80 mg total a day for swelling.    If you need a refill on your cardiac medications before your next appointment, please call your pharmacy.     Testing/Procedures To Be Arranged:  N/A  Follow-Up:   We will be contacting Hospice and place a referral.     At [facility], you and your health needs are our priority.  As part of our continuing mission to provide you with exceptional heart care, we have created designated Provider Care Teams.  These Care Teams include your primary Cardiologist (physician) and Advanced Practice Providers (APPs -  Physician Assistants and Nurse Practitioners) who all work together to provide you with the care you need, when you need it.  Special Instructions:  . Stay safe, wash your hands for at least 20 seconds and wear a mask when needed.  . It was good to talk with you today.    Call the [place] office at [phone] if you have any questions, problems or concerns.

## 2020-03-02 ENCOUNTER — Encounter (HOSPITAL_BASED_OUTPATIENT_CLINIC_OR_DEPARTMENT_OTHER): Payer: Medicare Other | Admitting: Internal Medicine

## 2020-03-04 ENCOUNTER — Ambulatory Visit: Payer: Medicare Other | Admitting: Physical Medicine & Rehabilitation

## 2020-03-04 DIAGNOSIS — E871 Hypo-osmolality and hyponatremia: Secondary | ICD-10-CM | POA: Diagnosis not present

## 2020-03-04 DIAGNOSIS — R262 Difficulty in walking, not elsewhere classified: Secondary | ICD-10-CM | POA: Diagnosis not present

## 2020-03-04 DIAGNOSIS — I480 Paroxysmal atrial fibrillation: Secondary | ICD-10-CM | POA: Diagnosis not present

## 2020-03-04 DIAGNOSIS — Z466 Encounter for fitting and adjustment of urinary device: Secondary | ICD-10-CM | POA: Diagnosis not present

## 2020-03-04 DIAGNOSIS — Z4781 Encounter for orthopedic aftercare following surgical amputation: Secondary | ICD-10-CM | POA: Diagnosis not present

## 2020-03-04 DIAGNOSIS — Z9981 Dependence on supplemental oxygen: Secondary | ICD-10-CM | POA: Diagnosis not present

## 2020-03-04 DIAGNOSIS — I110 Hypertensive heart disease with heart failure: Secondary | ICD-10-CM | POA: Diagnosis not present

## 2020-03-04 DIAGNOSIS — J449 Chronic obstructive pulmonary disease, unspecified: Secondary | ICD-10-CM | POA: Diagnosis not present

## 2020-03-04 DIAGNOSIS — R531 Weakness: Secondary | ICD-10-CM | POA: Diagnosis not present

## 2020-03-04 DIAGNOSIS — D649 Anemia, unspecified: Secondary | ICD-10-CM | POA: Diagnosis not present

## 2020-03-04 DIAGNOSIS — L89152 Pressure ulcer of sacral region, stage 2: Secondary | ICD-10-CM | POA: Diagnosis not present

## 2020-03-04 DIAGNOSIS — J9611 Chronic respiratory failure with hypoxia: Secondary | ICD-10-CM | POA: Diagnosis not present

## 2020-03-04 DIAGNOSIS — I87322 Chronic venous hypertension (idiopathic) with inflammation of left lower extremity: Secondary | ICD-10-CM | POA: Diagnosis not present

## 2020-03-04 DIAGNOSIS — N139 Obstructive and reflux uropathy, unspecified: Secondary | ICD-10-CM | POA: Diagnosis not present

## 2020-03-04 DIAGNOSIS — I739 Peripheral vascular disease, unspecified: Secondary | ICD-10-CM | POA: Diagnosis not present

## 2020-03-04 DIAGNOSIS — I5032 Chronic diastolic (congestive) heart failure: Secondary | ICD-10-CM | POA: Diagnosis not present

## 2020-03-04 DIAGNOSIS — Z89611 Acquired absence of right leg above knee: Secondary | ICD-10-CM | POA: Diagnosis not present

## 2020-03-04 DIAGNOSIS — R339 Retention of urine, unspecified: Secondary | ICD-10-CM | POA: Diagnosis not present

## 2020-03-04 DIAGNOSIS — I2510 Atherosclerotic heart disease of native coronary artery without angina pectoris: Secondary | ICD-10-CM | POA: Diagnosis not present

## 2020-03-05 ENCOUNTER — Other Ambulatory Visit: Payer: Self-pay | Admitting: Internal Medicine

## 2020-03-05 NOTE — Telephone Encounter (Signed)
Called and spoke with the pt's spouse. Pt is out of Xanax. Last filled by hospitalist on 02/16/20.   Dose: 0.25 mg Route: Oral Frequency: 3 times daily PRN for anxiety  Dispense Quantity: 30 tablet Refills: 0       Sig: Take 1 tablet (0.25 mg total) by mouth 3 (three) times daily as needed for anxiety.      Start Date: 02/16/20 End Date: --  Written Date: 02/16/20     Dr [name], please sent refill pt pt. Thanks.

## 2020-03-05 NOTE — Telephone Encounter (Signed)
Called and spoke with the pt's spouse  She states that they were notified the the alprazolam was sent but it was the 0.25 mg  This is what pt was d/c on but he wants to get the 0.5 mg dose filled next time  Please advise thanks

## 2020-03-09 ENCOUNTER — Encounter (HOSPITAL_BASED_OUTPATIENT_CLINIC_OR_DEPARTMENT_OTHER): Payer: Medicare Other | Attending: Internal Medicine | Admitting: Internal Medicine

## 2020-03-09 ENCOUNTER — Other Ambulatory Visit: Payer: Self-pay

## 2020-03-09 DIAGNOSIS — E11622 Type 2 diabetes mellitus with other skin ulcer: Secondary | ICD-10-CM | POA: Diagnosis not present

## 2020-03-09 DIAGNOSIS — I110 Hypertensive heart disease with heart failure: Secondary | ICD-10-CM | POA: Insufficient documentation

## 2020-03-09 DIAGNOSIS — Z951 Presence of aortocoronary bypass graft: Secondary | ICD-10-CM | POA: Diagnosis not present

## 2020-03-09 DIAGNOSIS — I739 Peripheral vascular disease, unspecified: Secondary | ICD-10-CM | POA: Diagnosis not present

## 2020-03-09 DIAGNOSIS — Z89611 Acquired absence of right leg above knee: Secondary | ICD-10-CM | POA: Insufficient documentation

## 2020-03-09 DIAGNOSIS — L03116 Cellulitis of left lower limb: Secondary | ICD-10-CM | POA: Insufficient documentation

## 2020-03-09 DIAGNOSIS — I509 Heart failure, unspecified: Secondary | ICD-10-CM | POA: Insufficient documentation

## 2020-03-09 DIAGNOSIS — L89153 Pressure ulcer of sacral region, stage 3: Secondary | ICD-10-CM | POA: Insufficient documentation

## 2020-03-09 DIAGNOSIS — I5032 Chronic diastolic (congestive) heart failure: Secondary | ICD-10-CM | POA: Diagnosis not present

## 2020-03-09 DIAGNOSIS — L89152 Pressure ulcer of sacral region, stage 2: Secondary | ICD-10-CM | POA: Diagnosis not present

## 2020-03-09 DIAGNOSIS — I2510 Atherosclerotic heart disease of native coronary artery without angina pectoris: Secondary | ICD-10-CM | POA: Diagnosis not present

## 2020-03-09 DIAGNOSIS — Z4781 Encounter for orthopedic aftercare following surgical amputation: Secondary | ICD-10-CM | POA: Diagnosis not present

## 2020-03-09 DIAGNOSIS — J9611 Chronic respiratory failure with hypoxia: Secondary | ICD-10-CM | POA: Diagnosis not present

## 2020-03-09 DIAGNOSIS — L98499 Non-pressure chronic ulcer of skin of other sites with unspecified severity: Secondary | ICD-10-CM | POA: Diagnosis not present

## 2020-03-09 NOTE — Progress Notes (Addendum)
Bellino, [name] ([id number]) Visit Report for 03/09/2020 Arrival Information Details Patient Name: Date of Service: DO RN, [place] V ID L. 03/09/2020 3:30 PM Medical Record Number: [id number] Patient Account Number: [id number] Date of Birth/Sex: Treating RN: March 28, 1929 (87 y.o. [name] Primary Care Philemon Riedesel: [name] Other Clinician: Referring Jaydeen Darley: Treating Denora Wysocki/Extender: [name] in Treatment: 44 Visit Information History Since Last Visit Added or deleted any medications: Yes Patient Arrived: Wheel Chair Any new allergies or adverse reactions: No Arrival Time: 15:55 Had a fall or experienced change in No Accompanied By: spouse activities of daily living that may affect Transfer Assistance: None risk of falls: Patient Identification Verified: Yes Signs or symptoms of abuse/neglect since last visito No Secondary Verification Process Completed: Yes Hospitalized since last visit: Yes Patient Requires Transmission-Based Precautions: No Implantable device outside of the clinic excluding No Patient Has Alerts: Yes cellular tissue based products placed in the center Patient Alerts: L ABI N/C, TBI = .20 since last visit: Has Dressing in Place as Prescribed: Yes Pain Present Now: Yes Electronic Signature(s) Signed: 03/09/2020 5:57:00 PM By: Baruch Gouty RN, BSN Entered By: Baruch Gouty on 03/09/2020 15:58:01 -------------------------------------------------------------------------------- Clinic Level of Care Assessment Details Patient Name: Date of Service: DO RN, [name] 03/09/2020 3:30 PM Medical Record Number: [id number] Patient Account Number: [id number] Date of Birth/Sex: Treating RN: September 17, 1929 (87 y.o. [name], [name] Primary Care Zebediah Beezley: [name] Other Clinician: Referring Andre Swander: Treating Ivey Nembhard/Extender: [name] in Treatment: 17 Clinic Level of Care Assessment Items TOOL 4 Quantity  Score X- 1 0 Use when only an EandM is performed on FOLLOW-UP visit ASSESSMENTS - Nursing Assessment / Reassessment X- 1 10 Reassessment of Co-morbidities (includes updates in patient status) X- 1 5 Reassessment of Adherence to Treatment Plan ASSESSMENTS - Wound and Skin A ssessment / Reassessment X - Simple Wound Assessment / Reassessment - one wound 1 5 []  - 0 Complex Wound Assessment / Reassessment - multiple wounds X- 1 10 Dermatologic / Skin Assessment (not related to wound area) ASSESSMENTS - Focused Assessment []  - 0 Circumferential Edema Measurements - multi extremities X- 1 10 Nutritional Assessment / Counseling / Intervention []  - 0 Lower Extremity Assessment (monofilament, tuning fork, pulses) []  - 0 Peripheral Arterial Disease Assessment (using hand held doppler) ASSESSMENTS - Ostomy and/or Continence Assessment and Care []  - 0 Incontinence Assessment and Management []  - 0 Ostomy Care Assessment and Management (repouching, etc.) PROCESS - Coordination of Care X - Simple Patient / Family Education for ongoing care 1 15 []  - 0 Complex (extensive) Patient / Family Education for ongoing care X- 1 10 Staff obtains Programmer, systems, Records, T Results / Process Orders est X- 1 10 Staff telephones HHA, Nursing Homes / Clarify orders / etc []  - 0 Routine Transfer to another Facility (non-emergent condition) []  - 0 Routine Hospital Admission (non-emergent condition) []  - 0 New Admissions / Biomedical engineer / Ordering NPWT Apligraf, etc. , []  - 0 Emergency Hospital Admission (emergent condition) X- 1 10 Simple Discharge Coordination []  - 0 Complex (extensive) Discharge Coordination PROCESS - Special Needs []  - 0 Pediatric / Minor Patient Management []  - 0 Isolation Patient Management []  - 0 Hearing / Language / Visual special needs []  - 0 Assessment of Community assistance (transportation, D/C planning, etc.) []  - 0 Additional assistance / Altered  mentation []  - 0 Support Surface(s) Assessment (bed, cushion, seat, etc.) INTERVENTIONS - Wound Cleansing / Measurement X - Simple Wound Cleansing -  one wound 1 5 []  - 0 Complex Wound Cleansing - multiple wounds X- 1 5 Wound Imaging (photographs - any number of wounds) []  - 0 Wound Tracing (instead of photographs) X- 1 5 Simple Wound Measurement - one wound []  - 0 Complex Wound Measurement - multiple wounds INTERVENTIONS - Wound Dressings X - Small Wound Dressing one or multiple wounds 1 10 []  - 0 Medium Wound Dressing one or multiple wounds []  - 0 Large Wound Dressing one or multiple wounds []  - 0 Application of Medications - topical []  - 0 Application of Medications - injection INTERVENTIONS - Miscellaneous []  - 0 External ear exam []  - 0 Specimen Collection (cultures, biopsies, blood, body fluids, etc.) []  - 0 Specimen(s) / Culture(s) sent or taken to Lab for analysis []  - 0 Patient Transfer (multiple staff / Civil Service fast streamer / Similar devices) []  - 0 Simple Staple / Suture removal (25 or less) []  - 0 Complex Staple / Suture removal (26 or more) []  - 0 Hypo / Hyperglycemic Management (close monitor of Blood Glucose) []  - 0 Ankle / Brachial Index (ABI) - do not check if billed separately X- 1 5 Vital Signs Has the patient been seen at the hospital within the last three years: Yes Total Score: 115 Level Of Care: New/Established - Level 3 Electronic Signature(s) Signed: 03/09/2020 5:58:27 PM By: Rhae Hammock RN Entered By: Rhae Hammock on 03/09/2020 16:41:04 -------------------------------------------------------------------------------- Complex / Palliative Patient Assessment Details Patient Name: Date of Service: DO RN, [name] 03/09/2020 3:30 PM Medical Record Number: [id number] Patient Account Number: [id number] Date of Birth/Sex: Treating RN: [birth year] (87 y.o. [name] Primary Care Kynslei Art: [name] Other Clinician: Referring  Mahima Hottle: Treating Analycia Khokhar/Extender: [name] in Treatment: 17 Palliative Management Criteria Complex Wound Management Criteria Patient has remarkable or complex co-morbidities requiring medications or treatments that extend wound healing times. Examples: Diabetes mellitus with chronic renal failure or end stage renal disease requiring dialysis Advanced or poorly controlled rheumatoid arthritis Diabetes mellitus and end stage chronic obstructive pulmonary disease Active cancer with current chemo- or radiation therapy PAD, COPD, CHF Care Approach Wound Care Plan: Complex Wound Management Electronic Signature(s) Signed: 03/12/2020 5:02:45 PM By: Levan Hurst RN, BSN Signed: 03/26/2020 5:26:06 PM By: [name] MD Entered By: Levan Hurst on 03/12/2020 13:53:26 -------------------------------------------------------------------------------- Lower Extremity Assessment Details Patient Name: Date of Service: DO RN, [name] 03/09/2020 3:30 PM Medical Record Number: [id number] Patient Account Number: [id number] Date of Birth/Sex: Treating RN: 1929-01-23 (87 y.o. [name] Primary Care Draya Felker: [name] Other Clinician: Referring Mercede Rollo: Treating Raela Bohl/Extender: [name] in Treatment: 17 Electronic Signature(s) Signed: 03/09/2020 5:57:00 PM By: Baruch Gouty RN, BSN Entered By: Baruch Gouty on 03/09/2020 16:05:53 -------------------------------------------------------------------------------- Multi Wound Chart Details Patient Name: Date of Service: DO RN, [name] 03/09/2020 3:30 PM Medical Record Number: [id number] Patient Account Number: [id number] Date of Birth/Sex: Treating RN: 1929-12-08 (87 y.o. [name], [name] Primary Care Mellanie Bejarano: [name] Other Clinician: Referring Manasa Spease: Treating Lataja Newland/Extender: [name] in Treatment: 17 Vital Signs Height(in):  69 Pulse(bpm): 97 Weight(lbs): Blood Pressure(mmHg): 117/63 Body Mass Index(BMI): Temperature(F): 98 Respiratory Rate(breaths/min): 20 Photos: [15:No Photos Sacrum] [16:No Photos Left, Proximal Upper Arm] [17:No Photos Left, Distal Upper Arm] Wound Location: [15:Pressure Injury] [16:Trauma] [17:Trauma] Wounding Event: [15:Pressure Ulcer] [16:Trauma, Other] [17:Trauma, Other] Primary Etiology: [15:Cataracts, Asthma, Chronic] [16:Cataracts, Asthma, Chronic] [17:Cataracts, Asthma, Chronic] Comorbid History: [15:Obstructive Pulmonary Disease (COPD), Congestive Heart Failure, Coronary  Artery Disease, Hypertension, Peripheral Arterial Disease, Osteoarthritis, Confinement Anxiety 08/01/2019] [16:Obstructive Pulmonary Disease (COPD), Congestive  Heart Failure, Coronary Artery Disease, Hypertension, Peripheral Arterial Disease, Osteoarthritis, Confinement Anxiety 01/04/2020] [17:Obstructive Pulmonary Disease (COPD), Congestive Heart Failure, Coronary Artery Disease, Hypertension, Peripheral  Arterial Disease, Osteoarthritis, Confinement Anxiety 01/04/2020] Date Acquired: [15:17] [16:9] [17:9] Weeks of Treatment: [15:Open] [16:Healed - Epithelialized] [17:Healed - Epithelialized] Wound Status: [15:0.7x0.3x0.3] [16:0x0x0] [17:0x0x0] Measurements L x W x D (cm) [15:0.165] [16:0] [17:0] A (cm) : rea [15:0.049] [16:0] [17:0] Volume (cm) : [15:25.00%] [16:100.00%] [17:100.00%] % Reduction in A rea: [15:-122.70%] [16:100.00%] [17:100.00%] % Reduction in Volume: [15:12] Starting Position 1 (o'clock): [15:12] Ending Position 1 (o'clock): [15:0.3] Maximum Distance 1 (cm): [15:Yes] [16:No] [17:No] Undermining: [15:Category/Stage III] [16:Full Thickness Without Exposed] [17:Full Thickness Without Exposed] Classification: [15:Small] [16:Support Structures None Present] [17:Support Structures None Present] Exudate Amount: [15:Serous] [16:N/A] [17:N/A] Exudate Type: [15:amber] [16:N/A] [17:N/A] Exudate  Color: [15:Epibole] [16:Distinct, outline attached] [17:Distinct, outline attached] Wound Margin: [15:Large (67-100%)] [16:None Present (0%)] [17:None Present (0%)] Granulation Amount: [15:Pink, Pale] [16:N/A] [17:N/A] Granulation Quality: [15:Small (1-33%)] [16:None Present (0%)] [17:None Present (0%)] Necrotic Amount: [15:Fat Layer (Subcutaneous Tissue): Yes Fascia: No] [17:Fascia: No] Exposed Structures: [15:Fascia: No Tendon: No Muscle: No Joint: No Bone: No Small (1-33%)] [16:Fat Layer (Subcutaneous Tissue): No Tendon: No Muscle: No Joint: No Bone: No Large (67-100%)] [17:Fat Layer (Subcutaneous Tissue): No Tendon: No Muscle: No Joint: No Bone: No  Large (67-100%)] Treatment Notes Electronic Signature(s) Signed: 03/09/2020 5:58:27 PM By: Rhae Hammock RN Signed: 03/09/2020 5:59:48 PM By: [name] MD Entered By: [name] on 03/09/2020 16:41:45 -------------------------------------------------------------------------------- Multi-Disciplinary Care Plan Details Patient Name: Date of Service: DO RN, [name] 03/09/2020 3:30 PM Medical Record Number: [id number] Patient Account Number: [id number] Date of Birth/Sex: Treating RN: [birth year] (87 y.o. [name], [name] Primary Care Jensyn Shave: [name] Other Clinician: Referring Jaleiyah Alas: Treating Micholas Drumwright/Extender: [name] in Treatment: 17 Active Inactive Wound/Skin Impairment Nursing Diagnoses: Knowledge deficit related to ulceration/compromised skin integrity Goals: Patient/caregiver will verbalize understanding of skin care regimen Date Initiated: 11/11/2019 Target Resolution Date: 04/07/2020 Goal Status: Active Ulcer/skin breakdown will have a volume reduction of 30% by week 4 Date Initiated: 11/11/2019 Target Resolution Date: 04/06/2020 Goal Status: Active Interventions: Assess patient/caregiver ability to obtain necessary supplies Assess patient/caregiver ability to perform  ulcer/skin care regimen upon admission and as needed Assess ulceration(s) every visit Notes: Electronic Signature(s) Signed: 03/09/2020 5:58:27 PM By: Rhae Hammock RN Entered By: Rhae Hammock on 03/09/2020 16:15:40 -------------------------------------------------------------------------------- Pain Assessment Details Patient Name: Date of Service: DO RN, [name] 03/09/2020 3:30 PM Medical Record Number: [id number] Patient Account Number: [id number] Date of Birth/Sex: Treating RN: 03/17/1929 (87 y.o. [name] Primary Care Luie Laneve: [name] Other Clinician: Referring Alyanah Elliott: Treating Catheryne Deford/Extender: [name] in Treatment: 17 Active Problems Location of Pain Severity and Description of Pain Patient Has Paino Yes Site Locations Pain Location: Pain Location: Generalized Pain Duration of the Pain. Constant / Intermittento Constant Rate the pain. Current Pain Level: 6 Character of Pain Describe the Pain: Aching Pain Management and Medication Current Pain Management: Medication: Yes Is the Current Pain Management Adequate: Adequate How does your wound impact your activities of daily livingo Sleep: Yes Bathing: No Appetite: No Relationship With Others: No Bladder Continence: No Emotions: Yes Bowel Continence: No Hobbies: No Toileting: No Dressing: No Electronic Signature(s) Signed: 03/09/2020 5:57:00 PM By: Baruch Gouty RN, BSN Entered By: Baruch Gouty on 03/09/2020 15:59:56 -------------------------------------------------------------------------------- Patient/Caregiver Education Details Patient Name:  Date of Service: DO RN, [name] ID Carlean [name] 3/1/2022andnbsp3:30 PM Medical Record Number: [id number] Patient Account Number: [id number] Date of Birth/Gender: Treating RN: [birth year] (87 y.o. [name] Primary Care Physician: [name] Other Clinician: Referring Physician: Treating Physician/Extender:  [name] in Treatment: 63 Education Assessment Education Provided To: Patient Education Topics Provided Basic Hygiene: Methods: Explain/Verbal Responses: State content correctly Electronic Signature(s) Signed: 03/09/2020 5:58:27 PM By: Rhae Hammock RN Entered By: Rhae Hammock on 03/09/2020 16:16:02 -------------------------------------------------------------------------------- Wound Assessment Details Patient Name: Date of Service: DO RN, [name] 03/09/2020 3:30 PM Medical Record Number: [id number] Patient Account Number: [id number] Date of Birth/Sex: Treating RN: May 24, 1929 (87 y.o. [name] Primary Care Madex Seals: [name] Other Clinician: Referring Arfa Lamarca: Treating Yaser Harvill/Extender: [name] in Treatment: 17 Wound Status Wound Number: 15 Primary Pressure Ulcer Etiology: Wound Location: Sacrum Wound Open Wounding Event: Pressure Injury Status: Date Acquired: 08/01/2019 Comorbid Cataracts, Asthma, Chronic Obstructive Pulmonary Disease Weeks Of Treatment: 17 History: (COPD), Congestive Heart Failure, Coronary Artery Disease, Clustered Wound: No Hypertension, Peripheral Arterial Disease, Osteoarthritis, Confinement Anxiety Photos Wound Measurements Length: (cm) 0.7 Width: (cm) 0.3 Depth: (cm) 0.3 Area: (cm) 0.165 Volume: (cm) 0.049 % Reduction in Area: 25% % Reduction in Volume: -122.7% Epithelialization: Small (1-33%) Tunneling: No Undermining: Yes Starting Position (o'clock): 12 Ending Position (o'clock): 12 Maximum Distance: (cm) 0.3 Wound Description Classification: Category/Stage III Wound Margin: Epibole Exudate Amount: Small Exudate Type: Serous Exudate Color: amber Foul Odor After Cleansing: No Slough/Fibrino Yes Wound Bed Granulation Amount: Large (67-100%) Exposed Structure Granulation Quality: Pink, Pale Fascia Exposed: No Necrotic Amount: Small (1-33%) Fat  Layer (Subcutaneous Tissue) Exposed: Yes Necrotic Quality: Adherent Slough Tendon Exposed: No Muscle Exposed: No Joint Exposed: No Bone Exposed: No Electronic Signature(s) Signed: 03/09/2020 5:53:33 PM By: [name] Signed: 03/09/2020 5:57:00 PM By: Baruch Gouty RN, BSN Entered By: [name] on 03/09/2020 17:53:04 -------------------------------------------------------------------------------- Wound Assessment Details Patient Name: Date of Service: DO RN, [name] 03/09/2020 3:30 PM Medical Record Number: [id number] Patient Account Number: [id number] Date of Birth/Sex: Treating RN: 10-06-1929 (87 y.o. [name] Primary Care Sadey Yandell: [name] Other Clinician: Referring Marsel Gail: Treating Devani Odonnel/Extender: [name] in Treatment: 17 Wound Status Wound Number: 16 Primary Trauma, Other Etiology: Wound Location: Left, Proximal Upper Arm Wound Healed - Epithelialized Wounding Event: Trauma Status: Date Acquired: 01/04/2020 Comorbid Cataracts, Asthma, Chronic Obstructive Pulmonary Disease Weeks Of Treatment: 9 History: (COPD), Congestive Heart Failure, Coronary Artery Disease, Clustered Wound: No Hypertension, Peripheral Arterial Disease, Osteoarthritis, Confinement Anxiety Wound Measurements Length: (cm) Width: (cm) Depth: (cm) Area: (cm) Volume: (cm) 0 % Reduction in Area: 100% 0 % Reduction in Volume: 100% 0 Epithelialization: Large (67-100%) 0 Tunneling: No 0 Undermining: No Wound Description Classification: Full Thickness Without Exposed Support Structures Wound Margin: Distinct, outline attached Exudate Amount: None Present Foul Odor After Cleansing: No Slough/Fibrino No Wound Bed Granulation Amount: None Present (0%) Exposed Structure Necrotic Amount: None Present (0%) Fascia Exposed: No Fat Layer (Subcutaneous Tissue) Exposed: No Tendon Exposed: No Muscle Exposed: No Joint Exposed: No Bone Exposed:  No Electronic Signature(s) Signed: 03/09/2020 5:57:00 PM By: Baruch Gouty RN, BSN Entered By: Baruch Gouty on 03/09/2020 16:09:03 -------------------------------------------------------------------------------- Wound Assessment Details Patient Name: Date of Service: DO RN, [name] 03/09/2020 3:30 PM Medical Record Number: [id number] Patient Account Number: [id number] Date of Birth/Sex: Treating RN: 1930/01/02 (87 y.o. [name] Primary Care Blima Jaimes: [name] Other Clinician: Referring Markisha Meding: Treating  Momina Hunton/Extender: [name] in Treatment: 17 Wound Status Wound Number: 17 Primary Trauma, Other Etiology: Wound Location: Left, Distal Upper Arm Wound Healed - Epithelialized Wounding Event: Trauma Status: Date Acquired: 01/04/2020 Comorbid Cataracts, Asthma, Chronic Obstructive Pulmonary Disease Weeks Of Treatment: 9 History: (COPD), Congestive Heart Failure, Coronary Artery Disease, Clustered Wound: No Hypertension, Peripheral Arterial Disease, Osteoarthritis, Confinement Anxiety Wound Measurements Length: (cm) Width: (cm) Depth: (cm) Area: (cm) Volume: (cm) 0 % Reduction in Area: 100% 0 % Reduction in Volume: 100% 0 Epithelialization: Large (67-100%) 0 Tunneling: No 0 Undermining: No Wound Description Classification: Full Thickness Without Exposed Support Structures Wound Margin: Distinct, outline attached Exudate Amount: None Present Foul Odor After Cleansing: No Slough/Fibrino No Wound Bed Granulation Amount: None Present (0%) Exposed Structure Necrotic Amount: None Present (0%) Fascia Exposed: No Fat Layer (Subcutaneous Tissue) Exposed: No Tendon Exposed: No Muscle Exposed: No Joint Exposed: No Bone Exposed: No Electronic Signature(s) Signed: 03/09/2020 5:57:00 PM By: Baruch Gouty RN, BSN Entered By: Baruch Gouty on 03/09/2020  16:09:21 -------------------------------------------------------------------------------- [place] Details Patient Name: Date of Service: DO RN, [name] 03/09/2020 3:30 PM Medical Record Number: [id number] Patient Account Number: [id number] Date of Birth/Sex: Treating RN: Oct 15, 1929 (87 y.o. [name] Primary Care Nekayla Heider: [name] Other Clinician: Referring Sharea Guinther: Treating Victoriana Aziz/Extender: [name] in Treatment: 17 Vital Signs Time Taken: 15:58 Temperature (F): 98 Height (in): 69 Pulse (bpm): 97 Source: Stated Respiratory Rate (breaths/min): 20 Blood Pressure (mmHg): 117/63 Reference Range: 80 - 120 mg / dl Electronic Signature(s) Signed: 03/09/2020 5:57:00 PM By: Baruch Gouty RN, BSN Entered By: Baruch Gouty on 03/09/2020 16:18:10

## 2020-03-09 NOTE — Progress Notes (Signed)
[name], [name] Jews ([id number]) Visit Report for 03/09/2020 HPI Details Patient Name: Date of Service: DO RN, [place] V ID L. 03/09/2020 3:30 PM Medical Record Number: [id number] Patient Account Number: [id number] Date of Birth/Sex: Treating RN: 05/28/1929 (87 y.o. [name] Primary Care Provider: [name] Other Clinician: Referring Provider: Treating Provider/Extender: [name] in Treatment: 17 History of Present Illness HPI Description: 01/30/18 on evaluation today patient presents for initial inspection concerning the skin tear of the right forearm. Fortunately there does not appear to be any evidence of infection and this occurred approximately three days ago. He has been using Vaseline over the region. With that being said the patient does have a history of cataracts COPD, hypertension, peripheral arterial disease, and osteoarthritis. He tells me that he does have a little bit of pain at the site but nothing too significant at this time which is good news. Fortunately this overall appears to be doing fairly well which is good news. No fevers, chills, nausea, or vomiting noted at this time. The biggest issue at this point is that the wound is of quite significant size. READMISSION 04/21/2019 This is a 87 year old man with multiple medical problems who was seen once in the clinic here in January 2020 with a skin tear on his right forearm seen by [name]. This problem this time started sometime recently. He was noted by podiatry to have erosions and wounds of his right foot on toes 1-3. He also saw [name] of dermatology who gave him Silvadene cream for what I think was felt to be a stasis dermatitis of his bilateral lower legs. At some point he was referred to [name]. He was also noted to have previous noninvasive arterial studies that showed no flow to either one of his toes and noncompressible ABIs bilaterally. [name] did noninvasive arterial  studies on him. These showed that he had greater than 75% stenosis in the proximal and mid common femoral artery. Monophasic flow in the posterior tibial and dorsalis pedis positions on the right foot. ABIs again were noncompressible. He was felt to have total occlusion of the superficial femoral artery on the right. The overall interpretation is that he had severe multilevel arterial occlusive disease. Absent femoral pulses and superficial femoral artery occlusions. In spite of this he was not felt to be candidate for arteriography given his bilateral femoral artery occlusions as well he would not be a candidate for endovascular approach secondary to his common femoral artery occlusions and proximal iliac disease. He was not felt to be a candidate for an open infra inguinal bypass. He therefore felt he would need to be monitored over time with the only option being a right below-knee amputation The patient complains currently of pain at night when he is up in bed. The pain in the right foot is better when he puts the leg down over the bed side. This is compatible with rest pain. He has very limited activity i.e. is even exhausted getting dressed in the morning because of cardiopulmonary issues therefore it is difficult to gauge claudication I believe he was referred here by Dr. [name] with one of the major issues is whether he would be a candidate for hyperbaric oxygen. He is not a diabetic. Dr. [name] did give him Silvadene cream for dry flaking skin in his bilateral lower extremities, however the patient is concerned about using this in the face of sulfa allergy. Podiatry had previously given him in a steroid cream  I do not believe these use this either Past medical history is extensive including basal cell and squamous cell skin cancers, combined congestive heart failure, O2 dependent COPD, cor pulmonale, venous stasis dermatitis, peripheral arterial disease, hearing loss, 4/20; the patient still  has a wound on the right first toe. This is on the medial aspect extended there is a second area on the plantar aspect. Most of what the patient talks about is claudication with minimal activity [however the patient is limited by his COPD], or even at rest at night. He is not getting a lot of sleep. We use silver collagen on this wound 5/4; the patient has a wound on the tip of his right great toe also on the medial aspect. Setting of severe nonrevascularizable PAD [see description in HPI]. He does not describe any change in his pain. We have been using silver collagen 5/18; 2-week follow-up. The patient has wound on the tip of his right great toe as well as the medial aspect. He has developed new wounds x2 on the right lateral foot and the right lateral calcaneus. These look like ischemic areas. The patient talks about claudication at night. He seems to be more comfortable during the day. We have been using silver collagen to the wounds. I have previously debrided the eschar on the toes however this comes right back. 6/1; 2-week follow-up. This patient has ischemic wounds on his right foot in the setting of nonunreconstructable PAD [previously reviewed by Dr. Dixon]. He does not really describe severe pain but he does have episodic pain in the right great toe. He has an area on the tip of the right great toe as well as the medial aspect of the right great toe. A new area on the tip of the right fourth toe. He has the area on the right lateral foot fortunately the area on the right lateral heel appears to have epithelialized over. We are using silver collagen to all wounds 06/23/2019 upon evaluation today patient appears to be doing about the same in regard to his foot ulcers. Unfortunately he has 2 new skin tears on his left elbow and the right forearm that were new as of today. He states that it was doing okay until they went to change the dressing and they put some collagen on it that got stuck and  somewhat pulled back the skin unfortunately. Fortunately there is no signs of active infection at this time. No fevers, chills, nausea, vomiting, or diarrhea. 6/29; the patient was hospitalized from 6/18 through 6/23 going in with increasing drainage from the wounds on his right foot. He underwent a angiogram on 06/30/2019 by Dr. Donzetta Matters which revealed multilevel arterial disease including occlusion of the right common femoral artery. He reconstitutes the profunda femoris and the below-knee popliteal artery and appears to have posterior tibial peroneal runoff to the ankle although there is heavy calcification of the popliteal below the knee. He was offered consideration of a right above-knee amputation, palliative and/or hospice care and consideration of a very high risk attempt at bypass. He did receive IV antibiotics. He comes in the clinic today again with the 2 areas that are necrotic on the left great toe and a small punched out area on the right lateral foot. A lot of the erythema on top of the right foot but no warmth. He has a lot of swelling here. The patient is literally tortured over this decision that he needs to make. I went over this with him in  exceptional detail. I do not believe he is a candidate for any form of open bypass surgery. I think his vascular doctors would agree with that. He still has pain at night when he puts his leg up on the bed that is relieved by putting his leg dependent. This is not different from what I remember. He is not septic there is no additional skin breakdown that I can see 7/8 the patient continues to have severe pain in the right foot at night making it difficult for him to rest. There is increasing erythema in the right foot but the foot is cold. I do not think this is an infection I think this is likely to be tissue necrosis from ischemia. I thought he would have made an up decision today about going forward with hospice he has not. He did not go to see Dr.  Doren Custard on 7/6. Again he wants to talk to me at length about amputations. I do not think he will do well after an above-knee amputation on the right. Furthermore I do not think he is a candidate for bypass surgery on the right leg 7/20; patient still having a lot of pain in the right foot. He saw Dr. Donzetta Matters on 7/16. He is not felt to be able to undergo a complex revascularization and I certainly agree with that. He was offered a right below-knee amputation but he has not made that decision. I have repetitively suggested hospice care if he will not undergo an amputation he is not decided he wants to go that route either. He arrives in the clinic today accompanied by his son who is a Stage manager and his wife. I think the patient has tissue breakdown in the dorsal foot secondary to ischemia although his son brought up that some of the erythema could be a contact dermatitis as they were apparently layering a large area of pure call over the wound area. I was not aware of this not supposed to be doing it in this fashion. In any case the erythema look better than last week and I am quite convinced this is not cellulitis READMISSION 11/11/2019 Mr. Paula Libra is now an 87 year old man who we had in this clinic from [place] through July/21 largely as a result of ischemic wounds in his right foot. He was followed by vein and vascular [name]. His anatomy was not felt to be amenable to a revascularization. The patient had increasing ischemic pain but was very resistant to the notion of an amputation. Eventually he did go through with this on August 22 he will underwent an AKA by [name]. He went to rehabilitation. There he developed a small stage III lower sacral pressure ulcer and an area on his left lateral heel which is also probably a pressure ulcer. They have been using silver collagen on both wound areas. The patient states he is not in any unrelieved pain. He is in a wheelchair at home with a Roho cushion. He  is able to do his own transfers. I think he is probably putting far too much pressure on these areas Past medical history is reviewed. He has a history of CHF, severe PAD, hypertension, coronary artery disease status post CABG, COPD Gold stage IV on O2, hypothyroidism and a recent problem with urinary retention he now has a Foley catheter. He has had problems with left leg swelling and adjustment of his diuretics he is followed by Dr. Daneen Schick. The patient has severe PAD. On 06/30/2019 he had  an angiogram on the remaining left side he had heavily diseased femoral artery. His superficial femoral artery was flush occluded with runoff in the profunda. He was felt his SFA was likely occluded and reconstitutes the popliteal artery via runoff via the peroneal and posterior tibial artery. As noted he is not in a lot of pain. His last noninvasive study was on 6/20. On the left side this was noncompressible ABI but a TBI of only 0.20 with dampened monophasic waveforms 11/25/2019. Patient has 2 wound areas 1 on the left lateral heel and a ulcer on his coccyx. These are probably all pressure related although he has significant PAD complicating any attempt to heal a wound on the left heel. We have been using silver collagen on the wound and the coccyx silver alginate on the heel. 11/30; miraculously the left lateral heel wound has healed. The area on his coccyx is not much better. We have been using Prisma He apparently went back to see [name]. His wife paraphrasing says that he had terrible blood flow in the left lower extremity but he is not a candidate for revascularization. Fortunately had enough blood flow to heal the wound I will try to look at Dr. Mee Hives note by the time these next year 12/14; the area on his left lateral heel remains closed The area on his coccyx is about the same in terms of surface area and depth minimal debris however but it is still adherent. We have been using Iodoflex. They  stop me before I left the room to show me his right AKA stump. He tells me he is having a lot of pain in this area that is episodic it almost looks like there is bruising in this area but the bruising looks old I asked him about trauma but they were not aware of any. They see vascular tomorrow 12/28; the area on the coccyx looks absolutely no better. Completely lifeless surface of fibrinous debris and surrounding raised skin. We have been using Iodoflex this is simply not working. He had a fall when he got up forgetting that he was missing his right leg he has skin tears on the right humerus area just above the elbow and on the right forearm. 1/11; the area on the coccyx is about the same we have been using Santyl although the surface of this is no better. Small punched out wound. He also has skin tears on his arm which are drying up gradually. His wife had called urgently yesterday to be seen. Apparently he developed erythema and swelling of his left foot over the weekend and she was able to express a teaspoon of pus coming from the dorsal foot. She had called her podiatrist who prescribed Keflex however in response to advice from our office she took him to the emergency room. They apparently gave him IV antibiotics and discharged him on doxycycline. His white count was 14.5 with 92% neutrophils. He is not systemically unwell and does not really complain of a lot of pain 3/1; patient has not been here in about 7 weeks. In the interim he was hospitalized on 2 occasions from 01/23/2020 through 02/05/2020. The original admission was for cellulitis of the left leg he received IV antibiotics and Augmentin. It was noted that the right AKA stump broke down ultimately requiring a revision of the AKA by vein and vascular and he went to rehab medicine in hospital from 1/27 through 02/16/2020 He is back at home now. The only wound he has  is on the lower coccyx everything else is closed. He is not complaining of any  pain in the left leg although there is edema up into his mid thigh. He apparently has had his Lasix increased and this was felt to be secondary to congestive heart failure Electronic Signature(s) Signed: 03/09/2020 5:59:48 PM By: [name] MD Entered By: [name] on 03/09/2020 16:48:50 -------------------------------------------------------------------------------- Physical Exam Details Patient Name: Date of Service: DO RN, [name] 03/09/2020 3:30 PM Medical Record Number: [id number] Patient Account Number: [id number] Date of Birth/Sex: Treating RN: [birth year] (87 y.o. [name] Primary Care Provider: [name] Other Clinician: Referring Provider: Treating Provider/Extender: [name] in Treatment: 17 Cardiovascular Pedal pulses are palpable on the left. Notes Wound exam Coccyx is now a very tiny open area with no undermining but there is depth. Surface does not look particularly healthy. We gave him a full round of Santyl currently they are using some form of alginate. There is no other open wound He has 2-3+ pitting edema of his left leg up into his thigh there is no palpable warmth or tenderness here. His pedal pulses are not palpable for the dorsalis pedis pulse on the left is palpable but reduced Electronic Signature(s) Signed: 03/09/2020 5:59:48 PM By: [name] MD Entered By: [name] on 03/09/2020 16:50:28 -------------------------------------------------------------------------------- Physician Orders Details Patient Name: Date of Service: DO RN, [name] 03/09/2020 3:30 PM Medical Record Number: [id number] Patient Account Number: [id number] Date of Birth/Sex: Treating RN: [birth year] (87 y.o. [name] Primary Care Provider: [name] Other Clinician: Referring Provider: Treating Provider/Extender: [name] in Treatment: [phone] Verbal / Phone Orders: No Diagnosis  Coding Follow-up Appointments Return appointment in 3 weeks. Bathing/ Shower/ Hygiene May shower and wash wound with soap and water. - with dressing changes only. Off-Loading Turn and reposition every 2 hours [facility] wound care orders this week; continue Home Health for wound care. May utilize formulary equivalent dressing for wound treatment orders unless otherwise specified. - HH to change 3x a week, and 2x a week when pt. comes to [facility]. Other Home Health Orders/Instructions: - [facility]ound [facility] Wound #15 - Sacrum Cleanser: Wound Cleanser (Home Health) Every Other Day/30 Days Discharge Instructions: Cleanse the wound with wound cleanser prior to applying a clean dressing using gauze sponges, not tissue or cotton balls. Peri-Wound Care: Skin Prep [facility]) Every Other Day/30 Days Discharge Instructions: Use skin prep as directed Prim Dressing: Promogran Prisma Matrix, 4.34 (sq in) (silver collagen) (Home Health) Every Other Day/30 Days ary Discharge Instructions: Moisten collagen with saline or hydrogel Secondary Dressing: ComfortFoam Border, 3x3 in (silicone border) [facility]) Every Other Day/30 Days Discharge Instructions: Apply over primary dressing as directed. Electronic Signature(s) Signed: 03/09/2020 5:58:27 PM By: Rhae Hammock RN Signed: 03/09/2020 5:59:48 PM By: [name] MD Entered By: Rhae Hammock on 03/09/2020 16:38:38 -------------------------------------------------------------------------------- Problem List Details Patient Name: Date of Service: DO RN, [name] 03/09/2020 3:30 PM Medical Record Number: [id number] Patient Account Number: [id number] Date of Birth/Sex: Treating RN: [birth year] (87 y.o. [name], [name] Primary Care Provider: [name] Other Clinician: Referring Provider: Treating Provider/Extender: [name] in Treatment: 17 Active Problems ICD-10 Encounter Code Description Active  Date MDM Diagnosis L89.153 Pressure ulcer of sacral region, stage 3 11/11/2019 No Yes Z89.611 Acquired absence of right leg above knee 11/11/2019 No Yes S40.022D Contusion of left upper arm, subsequent encounter 01/06/2020 No Yes  Inactive Problems ICD-10 Code Description Active Date Inactive Date L89.623 Pressure ulcer of left heel, stage 3 11/11/2019 11/11/2019 L03.116 Cellulitis of left lower limb 01/20/2020 01/20/2020 Resolved Problems Electronic Signature(s) Signed: 03/09/2020 5:59:48 PM By: [name] MD Entered By: [name] on 03/09/2020 16:41:34 -------------------------------------------------------------------------------- Progress Note Details Patient Name: Date of Service: DO RN, [name] 03/09/2020 3:30 PM Medical Record Number: [id number] Patient Account Number: [id number] Date of Birth/Sex: Treating RN: 03/06/1929 (87 y.o. [name] Primary Care Provider: [name] Other Clinician: Referring Provider: Treating Provider/Extender: [name] in Treatment: 17 Subjective History of Present Illness (HPI) 01/30/18 on evaluation today patient presents for initial inspection concerning the skin tear of the right forearm. Fortunately there does not appear to be any evidence of infection and this occurred approximately three days ago. He has been using Vaseline over the region. With that being said the patient does have a history of cataracts COPD, hypertension, peripheral arterial disease, and osteoarthritis. He tells me that he does have a little bit of pain at the site but nothing too significant at this time which is good news. Fortunately this overall appears to be doing fairly well which is good news. No fevers, chills, nausea, or vomiting noted at this time. The biggest issue at this point is that the wound is of quite significant size. READMISSION 04/21/2019 This is a 87 year old man with multiple medical problems who was seen  once in the clinic here in January 2020 with a skin tear on his right forearm seen by [name]. This problem this time started sometime recently. He was noted by podiatry to have erosions and wounds of his right foot on toes 1-3. He also saw [name] of dermatology who gave him Silvadene cream for what I think was felt to be a stasis dermatitis of his bilateral lower legs. At some point he was referred to [name]. He was also noted to have previous noninvasive arterial studies that showed no flow to either one of his toes and noncompressible ABIs bilaterally. [name] did noninvasive arterial studies on him. These showed that he had greater than 75% stenosis in the proximal and mid common femoral artery. Monophasic flow in the posterior tibial and dorsalis pedis positions on the right foot. ABIs again were noncompressible. He was felt to have total occlusion of the superficial femoral artery on the right. The overall interpretation is that he had severe multilevel arterial occlusive disease. Absent femoral pulses and superficial femoral artery occlusions. In spite of this he was not felt to be candidate for arteriography given his bilateral femoral artery occlusions as well he would not be a candidate for endovascular approach secondary to his common femoral artery occlusions and proximal iliac disease. He was not felt to be a candidate for an open infra inguinal bypass. He therefore felt he would need to be monitored over time with the only option being a right below-knee amputation The patient complains currently of pain at night when he is up in bed. The pain in the right foot is better when he puts the leg down over the bed side. This is compatible with rest pain. He has very limited activity i.e. is even exhausted getting dressed in the morning because of cardiopulmonary issues therefore it is difficult to gauge claudication I believe he was referred here by Dr. [name] with one of the  major issues is whether he would be a candidate for hyperbaric oxygen. He is not  a diabetic. Dr. [name] did give him Silvadene cream for dry flaking skin in his bilateral lower extremities, however the patient is concerned about using this in the face of sulfa allergy. 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 This is on the medial aspect extended there is a second area on the plantar aspect. Most of what the patient talks about is claudication with minimal activity [however the patient is limited by his COPD], or even at rest at night. He is not getting a lot of sleep. We use silver collagen on this wound 5/4; the patient has a wound on the tip of his right great toe also on the medial aspect. Setting of severe nonrevascularizable PAD [see description in HPI]. He does not describe any change in his pain. We have been using silver collagen 5/18; 2-week follow-up. The patient has wound on the tip of his right great toe as well as the medial aspect. He has developed new wounds x2 on the right lateral foot and the right lateral calcaneus. These look like ischemic areas. The patient talks about claudication at night. He seems to be more comfortable during the day. We have been using silver collagen to the wounds. I have previously debrided the eschar on the toes however this comes right back. 6/1; 2-week follow-up. This patient has ischemic wounds on his right foot in the setting of nonunreconstructable PAD [previously reviewed by Dr. Dixon]. He does not really describe severe pain but he does have episodic pain in the right great toe. He has an area on the tip of the right  great toe as well as the medial aspect of the right great toe. A new area on the tip of the right fourth toe. He has the area on the right lateral foot fortunately the area on the right lateral heel appears to have epithelialized over. We are using silver collagen to all wounds 06/23/2019 upon evaluation today patient appears to be doing about the same in regard to his foot ulcers. Unfortunately he has 2 new skin tears on his left elbow and the right forearm that were new as of today. He states that it was doing okay until they went to change the dressing and they put some collagen on it that got stuck and somewhat pulled back the skin unfortunately. Fortunately there is no signs of active infection at this time. No fevers, chills, nausea, vomiting, or diarrhea. 6/29; the patient was hospitalized from 6/18 through 6/23 going in with increasing drainage from the wounds on his right foot. He underwent a angiogram on 06/30/2019 by Dr. Donzetta Matters which revealed multilevel arterial disease including occlusion of the right common femoral artery. He reconstitutes the profunda femoris and the below-knee popliteal artery and appears to have posterior tibial peroneal runoff to the ankle although there is heavy calcification of the popliteal below the knee. He was offered consideration of a right above-knee amputation, palliative and/or hospice care and consideration of a very high risk attempt at bypass. He did receive IV antibiotics. He comes in the clinic today again with the 2 areas that are necrotic on the left great toe and a small punched out area on the right lateral foot. A  lot of the erythema on top of the right foot but no warmth. He has a lot of swelling here. The patient is literally tortured over this decision that he needs to make. I went over this with him in exceptional detail. I do not believe he is a candidate for any form of open bypass surgery. I think his vascular doctors would agree with that. He  still has pain at night when he puts his leg up on the bed that is relieved by putting his leg dependent. This is not different from what I remember. He is not septic there is no additional skin breakdown that I can see 7/8 the patient continues to have severe pain in the right foot at night making it difficult for him to rest. There is increasing erythema in the right foot but the foot is cold. I do not think this is an infection I think this is likely to be tissue necrosis from ischemia. I thought he would have made an up decision today about going forward with hospice he has not. He did not go to see [name] on 7/6. Again he wants to talk to me at length about amputations. I do not think he will do well after an above-knee amputation on the right. Furthermore I do not think he is a candidate for bypass surgery on the right leg 7/20; patient still having a lot of pain in the right foot. He saw Dr. Donzetta Matters on 7/16. He is not felt to be able to undergo a complex revascularization and I certainly agree with that. He was offered a right below-knee amputation but he has not made that decision. I have repetitively suggested hospice care if he will not undergo an amputation he is not decided he wants to go that route either. He arrives in the clinic today accompanied by his son who is a Stage manager and his wife. I think the patient has tissue breakdown in the dorsal foot secondary to ischemia although his son brought up that some of the erythema could be a contact dermatitis as they were apparently layering a large area of pure call over the wound area. I was not aware of this not supposed to be doing it in this fashion. In any case the erythema look better than last week and I am quite convinced this is not cellulitis READMISSION 11/11/2019 Mr. Paula Libra is now an 87 year old man who we had in this clinic from [place] through July/21 largely as a result of ischemic wounds in his right foot. He was followed by  vein and vascular [name]. His anatomy was not felt to be amenable to a revascularization. The patient had increasing ischemic pain but was very resistant to the notion of an amputation. Eventually he did go through with this on August 22 he will underwent an AKA by [name]. He went to rehabilitation. There he developed a small stage III lower sacral pressure ulcer and an area on his left lateral heel which is also probably a pressure ulcer. They have been using silver collagen on both wound areas. The patient states he is not in any unrelieved pain. He is in a wheelchair at home with a Roho cushion. He is able to do his own transfers. I think he is probably putting far too much pressure on these areas Past medical history is reviewed. He has a history of CHF, severe PAD, hypertension, coronary artery disease status post CABG, COPD Gold stage IV on O2, hypothyroidism and a  recent problem with urinary retention he now has a Foley catheter. He has had problems with left leg swelling and adjustment of his diuretics he is followed by Dr. Daneen Schick. The patient has severe PAD. On 06/30/2019 he had an angiogram on the remaining left side he had heavily diseased femoral artery. His superficial femoral artery was flush occluded with runoff in the profunda. He was felt his SFA was likely occluded and reconstitutes the popliteal artery via runoff via the peroneal and posterior tibial artery. As noted he is not in a lot of pain. His last noninvasive study was on 6/20. On the left side this was noncompressible ABI but a TBI of only 0.20 with dampened monophasic waveforms 11/25/2019. Patient has 2 wound areas 1 on the left lateral heel and a ulcer on his coccyx. These are probably all pressure related although he has significant PAD complicating any attempt to heal a wound on the left heel. We have been using silver collagen on the wound and the coccyx silver alginate on the heel. 11/30; miraculously the left  lateral heel wound has healed. The area on his coccyx is not much better. We have been using Prisma He apparently went back to see [name]. His wife paraphrasing says that he had terrible blood flow in the left lower extremity but he is not a candidate for revascularization. Fortunately had enough blood flow to heal the wound I will try to look at Dr. Mee Hives note by the time these next year 12/14; the area on his left lateral heel remains closed ooThe area on his coccyx is about the same in terms of surface area and depth minimal debris however but it is still adherent. We have been using Iodoflex. ooThey stop me before I left the room to show me his right AKA stump. He tells me he is having a lot of pain in this area that is episodic it almost looks like there is bruising in this area but the bruising looks old I asked him about trauma but they were not aware of any. They see vascular tomorrow 12/28; the area on the coccyx looks absolutely no better. Completely lifeless surface of fibrinous debris and surrounding raised skin. We have been using Iodoflex this is simply not working. ooHe had a fall when he got up forgetting that he was missing his right leg he has skin tears on the right humerus area just above the elbow and on the right forearm. 1/11; the area on the coccyx is about the same we have been using Santyl although the surface of this is no better. Small punched out wound. He also has skin tears on his arm which are drying up gradually. His wife had called urgently yesterday to be seen. Apparently he developed erythema and swelling of his left foot over the weekend and she was able to express a teaspoon of pus coming from the dorsal foot. She had called her podiatrist who prescribed Keflex however in response to advice from our office she took him to the emergency room. They apparently gave him IV antibiotics and discharged him on doxycycline. His white count was 14.5 with 92%  neutrophils. He is not systemically unwell and does not really complain of a lot of pain 3/1; patient has not been here in about 7 weeks. In the interim he was hospitalized on 2 occasions from 01/23/2020 through 02/05/2020. The original admission was for cellulitis of the left leg he received IV antibiotics and Augmentin. It was noted  that the right AKA stump broke down ultimately requiring a revision of the AKA by vein and vascular and he went to rehab medicine in hospital from 1/27 through 02/16/2020 He is back at home now. The only wound he has is on the lower coccyx everything else is closed. He is not complaining of any pain in the left leg although there is edema up into his mid thigh. He apparently has had his Lasix increased and this was felt to be secondary to congestive heart failure Objective Constitutional Vitals Time Taken: 3:58 PM, Height: 69 in, Source: Stated, Temperature: 98 F, Pulse: 97 bpm, Respiratory Rate: 20 breaths/min, Blood Pressure: 117/63 mmHg. Cardiovascular Pedal pulses are palpable on the left. General Notes: Wound exam ooCoccyx is now a very tiny open area with no undermining but there is depth. Surface does not look particularly healthy. We gave him a full round of Santyl currently they are using some form of alginate. There is no other open wound ooHe has 2-3+ pitting edema of his left leg up into his thigh there is no palpable warmth or tenderness here. His pedal pulses are not palpable for the dorsalis pedis pulse on the left is palpable but reduced Integumentary (Hair, Skin) Wound #15 status is Open. Original cause of wound was Pressure Injury. The date acquired was: 08/01/2019. The wound has been in treatment 17 weeks. The wound is located on the Sacrum. The wound measures 0.7cm length x 0.3cm width x 0.3cm depth; 0.165cm^2 area and 0.049cm^3 volume. There is Fat Layer (Subcutaneous Tissue) exposed. There is no tunneling noted, however, there is undermining  starting at 12:00 and ending at 12:00 with a maximum distance of 0.3cm. There is a small amount of serous drainage noted. The wound margin is epibole. There is large (67-100%) pink, pale granulation within the wound bed. There is a small (1-33%) amount of necrotic tissue within the wound bed including Adherent Slough. Wound #16 status is Healed - Epithelialized. Original cause of wound was Trauma. The date acquired was: 01/04/2020. The wound has been in treatment 9 weeks. The wound is located on the Left,Proximal Upper Arm. The wound measures 0cm length x 0cm width x 0cm depth; 0cm^2 area and 0cm^3 volume. There is no tunneling or undermining noted. There is a none present amount of drainage noted. The wound margin is distinct with the outline attached to the wound base. There is no granulation within the wound bed. There is no necrotic tissue within the wound bed. Wound #17 status is Healed - Epithelialized. Original cause of wound was Trauma. The date acquired was: 01/04/2020. The wound has been in treatment 9 weeks. The wound is located on the Left,Distal Upper Arm. The wound measures 0cm length x 0cm width x 0cm depth; 0cm^2 area and 0cm^3 volume. There is no tunneling or undermining noted. There is a none present amount of drainage noted. The wound margin is distinct with the outline attached to the wound base. There is no granulation within the wound bed. There is no necrotic tissue within the wound bed. Assessment Active Problems ICD-10 Pressure ulcer of sacral region, stage 3 Acquired absence of right leg above knee Contusion of left upper arm, subsequent encounter Plan Follow-up Appointments: Return appointment in 3 weeks. Bathing/ Shower/ Hygiene: May shower and wash wound with soap and water. - with dressing changes only. Off-Loading: Turn and reposition every 2 hours Home Health: New wound care orders this week; continue Home Health for wound care. May utilize formulary  equivalent  dressing for wound treatment orders unless otherwise specified. - HH to change 3x a week, and 2x a week when pt. comes to [facility]. Other Home Health Orders/Instructions: - [facility] WOUND #15: - Sacrum Wound Laterality: Cleanser: Wound Cleanser (Home Health) Every Other Day/30 Days Discharge Instructions: Cleanse the wound with wound cleanser prior to applying a clean dressing using gauze sponges, not tissue or cotton balls. Peri-Wound Care: Skin Prep [facility]) Every Other Day/30 Days Discharge Instructions: Use skin prep as directed Prim Dressing: Promogran Prisma Matrix, 4.34 (sq in) (silver collagen) (Home Health) Every Other Day/30 Days ary Discharge Instructions: Moisten collagen with saline or hydrogel Secondary Dressing: ComfortFoam Border, 3x3 in (silicone border) [facility]) Every Other Day/30 Days Discharge Instructions: Apply over primary dressing as directed. 1. I am going to switch to silver collagen and bordered foam to the coccyx 2. The coccyx wound is small with a nonviable surface. It is going to be very difficult to get this to close he apparently spends most of his day up in a wheelchair although he does have a Roho cushion.amount of pressure is likely to make any healing almost impossible. Nevertheless him being up in the chair sounds like it is more of quality of life issue. 3. He has 2-3+ pitting edema of the left leg up into the thigh but no evidence of a DVT and no cellulitis. He does not have any tenderness in the leg and does not complain of pain 4. He had a revision of the right AKA stump. This is totally closed at the moment apparently the sutures were recently taken out by vascular I see no open wound here no infection and again no tenderness Electronic Signature(s) Signed: 03/09/2020 5:59:48 PM By: [name] MD Entered By: [name] on 03/09/2020  16:52:49 -------------------------------------------------------------------------------- SuperBill Details Patient Name: Date of Service: DO RN, [name] 03/09/2020 Medical Record Number: [id number] Patient Account Number: [id number] Date of Birth/Sex: Treating RN: [birth year] (87 y.o. [name], [name] Primary Care Provider: [name] Other Clinician: Referring Provider: Treating Provider/Extender: [name] in Treatment: 17 Diagnosis Coding ICD-10 Codes Code Description [phone] Pressure ulcer of sacral region, stage 3 Z89.611 Acquired absence of right leg above knee S40.022D Contusion of left upper arm, subsequent encounter Facility Procedures CPT4 Code: [id number] Description: 99214 - WOUND CARE VISIT-LEV 4 EST PT Modifier: Quantity: 1 Physician Procedures : CPT4 Code Description Modifier [id number] - WC PHYS LEVEL 3 - EST PT ICD-10 Diagnosis Description L89.153 Pressure ulcer of sacral region, stage 3 Quantity: 1 Electronic Signature(s) Signed: 03/09/2020 5:59:48 PM By: [name] MD Entered By: [name] on 03/09/2020 16:53:07

## 2020-03-12 DIAGNOSIS — R3914 Feeling of incomplete bladder emptying: Secondary | ICD-10-CM | POA: Diagnosis not present

## 2020-03-12 DIAGNOSIS — R310 Gross hematuria: Secondary | ICD-10-CM | POA: Diagnosis not present

## 2020-03-12 DIAGNOSIS — D414 Neoplasm of uncertain behavior of bladder: Secondary | ICD-10-CM | POA: Diagnosis not present

## 2020-03-12 DIAGNOSIS — N401 Enlarged prostate with lower urinary tract symptoms: Secondary | ICD-10-CM | POA: Diagnosis not present

## 2020-03-15 ENCOUNTER — Other Ambulatory Visit: Payer: Self-pay | Admitting: Urology

## 2020-03-15 DIAGNOSIS — I5032 Chronic diastolic (congestive) heart failure: Secondary | ICD-10-CM | POA: Diagnosis not present

## 2020-03-15 DIAGNOSIS — L89152 Pressure ulcer of sacral region, stage 2: Secondary | ICD-10-CM | POA: Diagnosis not present

## 2020-03-15 DIAGNOSIS — Z4781 Encounter for orthopedic aftercare following surgical amputation: Secondary | ICD-10-CM | POA: Diagnosis not present

## 2020-03-15 DIAGNOSIS — J9611 Chronic respiratory failure with hypoxia: Secondary | ICD-10-CM | POA: Diagnosis not present

## 2020-03-15 DIAGNOSIS — I739 Peripheral vascular disease, unspecified: Secondary | ICD-10-CM | POA: Diagnosis not present

## 2020-03-15 DIAGNOSIS — I110 Hypertensive heart disease with heart failure: Secondary | ICD-10-CM | POA: Diagnosis not present

## 2020-03-16 ENCOUNTER — Telehealth: Payer: Self-pay | Admitting: Physician Assistant

## 2020-03-16 NOTE — Telephone Encounter (Signed)
   [facility] Pre-operative Risk Assessment    HEARTCARE STAFF: - Please ensure there is not already an duplicate clearance open for this procedure. - Under Visit Info/Reason for Call, type in Other and utilize the format Clearance MM/DD/YY or Clearance TBD. Do not use dashes or single digits. - If request is for dental extraction, please clarify the # of teeth to be extracted.  Request for surgical clearance:  1. What type of surgery is being performed?  TRANSURETHRAL RESECTION OF BLADDER TUMOR (TURBT)/ POST OPERATIVE INSTILLATION OF GEMCITABINE  When is this surgery scheduled? 04/06/2020  2. What type of clearance is required (medical clearance vs. Pharmacy clearance to hold med vs. Both)? Both   3. Are there any medications that need to be held prior to surgery and how long? Aspirin   4. Practice name and name of physician performing surgery? [facility]  5. What is the office phone number? [phone] Ext [phone]   7.   What is the office fax number? [phone]  8.   Anesthesia type (None, local, MAC, general) ? Choice    Shana A Stovall 03/16/2020, 12:35 PM  _________________________________________________________________   (provider comments below)

## 2020-03-17 DIAGNOSIS — I739 Peripheral vascular disease, unspecified: Secondary | ICD-10-CM | POA: Diagnosis not present

## 2020-03-17 DIAGNOSIS — J9611 Chronic respiratory failure with hypoxia: Secondary | ICD-10-CM | POA: Diagnosis not present

## 2020-03-17 DIAGNOSIS — I110 Hypertensive heart disease with heart failure: Secondary | ICD-10-CM | POA: Diagnosis not present

## 2020-03-17 DIAGNOSIS — Z4781 Encounter for orthopedic aftercare following surgical amputation: Secondary | ICD-10-CM | POA: Diagnosis not present

## 2020-03-17 DIAGNOSIS — I5032 Chronic diastolic (congestive) heart failure: Secondary | ICD-10-CM | POA: Diagnosis not present

## 2020-03-17 DIAGNOSIS — L89152 Pressure ulcer of sacral region, stage 2: Secondary | ICD-10-CM | POA: Diagnosis not present

## 2020-03-17 NOTE — Telephone Encounter (Signed)
   Primary Cardiologist: Sinclair Grooms, MD  Chart reviewed as part of pre-operative protocol coverage. Patient was contacted 03/17/2020 in reference to pre-operative risk assessment for pending surgery as outlined below.  [name] was last seen on 03/01/20 by [name] NP.    Pt had AKA revision on 01/27/20.  He has CAD s/p CABG 2003, PAD, chronic respiratory failure, chronic HFpEF, abnormal nuclear stress test in 2019, and new onset Afib/flutter following AKA revision. He is not anticoagulated given co-morbid conditions. He has been recommended for hospice, but turned down by [facility] due to type of BiPAP he uses at night. Hospice is currently NOT in place. Overall, he is high risk for any procedure. It is encouraging that he did not have a major cardiac complication with his recent AKA revision, with the exception of new Afib. He did have one episode of rapid heart rate in the 130s yesterday that lasted about 4-5 hours, but reports tolerating this well.   I discussed his risk with his wife [name] and they understand and accept the risk of surgery.   We have also been asked for clearance to hold ASA. Ideally, we would prefer to continue ASA throughout the perioperative period given both CAD and PAF. However, if doing so would significantly increase morbidity or mortality, please hold 5-7 days.   *I will reach out to Dr. Tamala Julian to see if he has additional recommendations.  Therefore, based on ACC/AHA guidelines, the patient would be at acceptable risk for the planned procedure without further cardiovascular testing.   The patient was advised that if he develops new symptoms prior to surgery to contact our office to arrange for a follow-up visit, and he verbalized understanding.  I will route this recommendation to the requesting party via Epic fax function and remove from pre-op pool. Please call with questions.  [place], PA 03/17/2020, 2:51 PM

## 2020-03-18 NOTE — Telephone Encounter (Signed)
Okay to hold aspirin if that is requested.  No further work-up needs to be done.  He is high risk as already stated.  I really have no further guidance.

## 2020-03-19 NOTE — Telephone Encounter (Signed)
   Primary Cardiologist: Sinclair Grooms, MD  Chart reviewed as part of pre-operative protocol coverage. Given past medical history and time since last visit, based on ACC/AHA guidelines, [name] would be at acceptable risk for the planned procedure without further cardiovascular testing.   He is considered high risk from a cardiac standpoint.  His aspirin may be held for 5-7 days prior to his procedure.  Please resume as soon as hemostasis is achieved at the discretion of the surgeon.  I will route this recommendation to the requesting party via Epic fax function and remove from pre-op pool.  Please call with questions.  Jossie Ng. Alioune Hodgkin NP-C    03/19/2020, 9:59 AM [address] Office [phone] Fax [phone]

## 2020-03-22 DIAGNOSIS — I110 Hypertensive heart disease with heart failure: Secondary | ICD-10-CM | POA: Diagnosis not present

## 2020-03-22 DIAGNOSIS — I739 Peripheral vascular disease, unspecified: Secondary | ICD-10-CM | POA: Diagnosis not present

## 2020-03-22 DIAGNOSIS — J9611 Chronic respiratory failure with hypoxia: Secondary | ICD-10-CM | POA: Diagnosis not present

## 2020-03-22 DIAGNOSIS — Z4781 Encounter for orthopedic aftercare following surgical amputation: Secondary | ICD-10-CM | POA: Diagnosis not present

## 2020-03-22 DIAGNOSIS — L89152 Pressure ulcer of sacral region, stage 2: Secondary | ICD-10-CM | POA: Diagnosis not present

## 2020-03-22 DIAGNOSIS — I5032 Chronic diastolic (congestive) heart failure: Secondary | ICD-10-CM | POA: Diagnosis not present

## 2020-03-23 ENCOUNTER — Telehealth: Payer: Self-pay | Admitting: *Deleted

## 2020-03-23 DIAGNOSIS — F419 Anxiety disorder, unspecified: Secondary | ICD-10-CM | POA: Diagnosis not present

## 2020-03-23 DIAGNOSIS — R319 Hematuria, unspecified: Secondary | ICD-10-CM | POA: Diagnosis not present

## 2020-03-23 DIAGNOSIS — R609 Edema, unspecified: Secondary | ICD-10-CM | POA: Diagnosis not present

## 2020-03-23 DIAGNOSIS — Z89611 Acquired absence of right leg above knee: Secondary | ICD-10-CM | POA: Diagnosis not present

## 2020-03-23 DIAGNOSIS — F324 Major depressive disorder, single episode, in partial remission: Secondary | ICD-10-CM | POA: Diagnosis not present

## 2020-03-23 NOTE — Telephone Encounter (Signed)
PA for spiriva has been completed and placed in MW look at to be signed and will need to be faxed back.  Will route to LR to follow up on form.

## 2020-03-24 DIAGNOSIS — I5032 Chronic diastolic (congestive) heart failure: Secondary | ICD-10-CM | POA: Diagnosis not present

## 2020-03-24 DIAGNOSIS — J9611 Chronic respiratory failure with hypoxia: Secondary | ICD-10-CM | POA: Diagnosis not present

## 2020-03-24 DIAGNOSIS — Z4781 Encounter for orthopedic aftercare following surgical amputation: Secondary | ICD-10-CM | POA: Diagnosis not present

## 2020-03-24 DIAGNOSIS — I739 Peripheral vascular disease, unspecified: Secondary | ICD-10-CM | POA: Diagnosis not present

## 2020-03-24 DIAGNOSIS — L89152 Pressure ulcer of sacral region, stage 2: Secondary | ICD-10-CM | POA: Diagnosis not present

## 2020-03-24 DIAGNOSIS — I110 Hypertensive heart disease with heart failure: Secondary | ICD-10-CM | POA: Diagnosis not present

## 2020-03-25 ENCOUNTER — Other Ambulatory Visit: Payer: Self-pay

## 2020-03-25 ENCOUNTER — Encounter: Payer: Medicare Other | Attending: Physical Medicine & Rehabilitation | Admitting: Psychology

## 2020-03-25 DIAGNOSIS — F411 Generalized anxiety disorder: Secondary | ICD-10-CM | POA: Insufficient documentation

## 2020-03-25 DIAGNOSIS — F331 Major depressive disorder, recurrent, moderate: Secondary | ICD-10-CM | POA: Insufficient documentation

## 2020-03-25 DIAGNOSIS — Z89611 Acquired absence of right leg above knee: Secondary | ICD-10-CM | POA: Insufficient documentation

## 2020-03-25 NOTE — Progress Notes (Signed)
Neuropsychological Consultation   Patient:   [name]   DOB:   12/01/1929  MR Number:  [id number]  Location:  [place] MEDICINE [facility] PHYSICAL MEDICINE AND REHABILITATION [place], [place] [id number] [place] [address] Dept: [phone]           Date of Service:   03/25/2020  Start Time:   1 PM End Time:   2 PM  Today's visit was an in person visit that was conducted in my outpatient clinic office with the patient, his wife and myself present.  Provider/Observer:  Ilean Skill, Psy.D.       Clinical Neuropsychologist       Billing Code/Service: [phone]  Chief Complaint:    [name] is an 87 year old male who was referred by Delice Lesch, MD who is following him due to right above-the-knee amputation and complicating factors.  He was seen in the inpatient unit following his amputation on 02/05/2020.  Since that time, the patient is continued to have ongoing medical issues but has begun having more sadness, anxiety, guilt and depressive symptomatology.  The patient is having intrusive memories and recall of old events that he begins to feel shame and guilt about even though they may have happened many decades ago.  Reason for Service:  [name] is an 87 year old male who was referred by Delice Lesch, MD who is following him due to right above-the-knee amputation and complicating factors.  He was seen in the inpatient unit following his amputation on 02/05/2020.  Since that time, the patient is continued to have ongoing medical issues but has begun having more sadness, anxiety, guilt and depressive symptomatology.  The patient is having intrusive memories and recall of old events that he begins to feel shame and guilt about even though they may have happened many decades ago.  The patient is extremely hard of hearing and today he did not have his hearing aids in which produced some complexity to the evaluation.  His wife  was present who aided in answering various questions.  The patient has numerous medical issues beyond his recent amputation.  The patient has COPD, chronic systolic congestive heart failure, history of severe depression, anxiety, obstructive sleep apnea, coronary artery disease involving coronary bypass graft etc.  The patient has a history of chronic urinary retention with chronic indwelling Foley catheter, hypertension, hyperlipidemia, CAD with CABG 2003, COPD with 2 L oxygen, peripheral vascular disease, diastolic congestive heart failure, right AKA 08/11/2019 as well as history of chronic wounds lower extremity and participated in inpatient rehabilitation services 08/21/2019 to 09/01/2019.  He was again seen on the inpatient unit in January 2022 due to left lower extremity cellulitis.  Examination of the AKA stump showed exposed bone and patient underwent revision of his right AKA on 01/27/2020.  I saw the patient personally while the patient was on the inpatient unit and he was alert and oriented.  He acknowledged significant issues with depression but at the time he was resistant towards trying an SSRI medication up to that point but began understanding the potential benefits for his depression anxiety.  There was a family history of depression and his brother taken SSRI medications for some time.  The patient has now been started on 0.5 mg of Xanax 3 times a day as well as starting taking sertraline for his depression.  He began taking this medication in February.  They started off with 1/2 tablet for 8  days and then now transitioned to 25 mg but it is only been a couple of days at 25 mg.  Behavioral Observation: [name]  presents as a 87 y.o.-year-old Right Caucasian Male who appeared his stated age. his dress was Appropriate and he was Well Groomed and his manners were Appropriate to the situation.  his participation was indicative of Appropriate and Redirectable behaviors.  There were physical  disabilities noted.  he displayed an appropriate level of cooperation and motivation.     Interactions:    Active The patient is extremely hard of hearing and did not have his hearing aids today.  Attention:   within normal limits and attention span appeared shorter than expected for age  Memory:   within normal limits; recent and remote memory intact  Visuo-spatial:  not examined  Speech (Volume):  low  Speech:   normal; normal  Thought Process:  Coherent and Relevant  Though Content:  WNL; not suicidal and not homicidal  Orientation:   person, place, time/date and situation  Judgment:   Fair  Planning:   Poor  Affect:    Anxious  Mood:    Anxious and Dysphoric  Insight:   Fair  Intelligence:   high  Marital Status/Living: The patient was born and raised in [place] along with 3 siblings.  The patient is married and lives with his wife and they have been married for 44 years.  The patient has 2 adult children a son who is 40 years old and a daughter who is 66 years old in good health.  Current Employment: The patient is retired.  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  Substance Use:  No concerns of substance abuse are reported.  Education:   The patient completed a 2-year college degree.  Medical History:   Past Medical History:  Diagnosis Date  . Anxiety   . Arthritis    "generalized" (04/04/2017)  . Asthma   . Basal cell carcinoma    "left ear"  . BPH (benign prostatic hyperplasia)    severe; s/p multiple biopsies  . CAD (coronary artery disease)   . Carotid artery occlusion   . Chronic respiratory failure ([place])   . Chronic rhinitis   . COPD (chronic obstructive pulmonary disease) (HCC)    2L [place] O2  . Diastolic heart failure ([place]) 2019  . Dilation of biliary tract   . Elevated troponin 03/09/2017  . Gallstones   . GERD (gastroesophageal reflux  disease)   . History of blood transfusion    "w/his CABG" (04/04/2017)  . History of kidney stones   . Hyperlipidemia   . Hypertension   . On home oxygen therapy    "~ 24/7" (04/04/2017)  . Peripheral vascular disease ([place])   . Pneumonia 2019  . Squamous cell carcinoma of skin 04/23/2013   in situ-Right flank (txpbx)  . Squamous cell carcinoma of skin 01/23/2017   [place]/ in siut- Left ear rim (CX3FU)  . Syncope and collapse   . Ureteral tumor 08/2015   had endoscopic procedure for evaluation, unable to reach for biopsy  . Urinary catheter (Foley) change required          Patient Active Problem List   Diagnosis Date Noted  . Asterix[place] 02/27/2020  . Chronic peripheral venous hypertension with lower extremity complication 94/85/4627  . Complete traumatic amputation at level  between right hip and knee, initial encounter ([place]) 02/27/2020  . Difficulty in walking, not elsewhere classified 02/27/2020  . Dyspepsia 02/27/2020  . Foot ulcer ([place]) 02/27/2020  . Hyperglycemia 02/27/2020  . Iron deficiency anemia 02/27/2020  . Major depression single episode, in partial remission ([place]) 02/27/2020  . Malnutrition related to chronic disease ([place]) 02/27/2020  . Osteomyelitis ([place]) 02/27/2020  . Paresthesia 02/27/2020  . Right upper quadrant pain 02/27/2020  . Slow transit constipation 02/27/2020  . Spasm of bladder 02/27/2020  . Urinary tract obstruction 02/27/2020  . Vitamin D deficiency 02/27/2020  . Congestive heart failure ([place]) 02/27/2020  . Prediabetes   . Urinary retention   . Normocytic anemia   . Chronic diastolic congestive heart failure ([place])   . Major depressive disorder with current active episode   . Paroxysmal atrial fibrillation (HCC)   . Right above-knee amputee ([place]) 02/05/2020  . Typical atrial flutter ([place])   . New onset atrial fibrillation ([place])   . Malnutrition of moderate degree 01/30/2020  . Cellulitis of left foot 01/24/2020  . Cellulitis and abscess of  left lower extremity 01/24/2020  . Abnormality of gait 10/20/2019  . Pain of left heel 09/01/2019  . Labile blood pressure   . Anxiety state   . Hypothyroidism   . Right hip pain   . Sacral decubitus ulcer   . Post-operative pain   . Chronic systolic congestive heart failure ([place])   . Urinary retention due to benign prostatic hyperplasia 08/20/2019  . SOB (shortness of breath)   . Chronic systolic CHF (congestive heart failure) ([place])   . Hypoalbuminemia due to protein-calorie malnutrition ([place])   . Transaminitis   . Acute blood loss anemia   . Hyponatremia   . Postoperative pain   . Supplemental oxygen dependent   . S/P AKA (above knee amputation) ([place]) 08/14/2019  . Peripheral arterial disease ([place]) 08/11/2019  . Abnormal urinalysis 08/11/2019  . Critical lower limb ischemia ([place]) 08/11/2019  . DNR (do not resuscitate)   . Voiding difficulty   . Palliative care by specialist   . Pressure injury of skin 06/29/2019  . Cellulitis of left lower extremity 06/28/2019  . Cellulitis of right lower extremity 06/27/2019  . AKI (acute kidney injury) ([place]) 06/27/2019  . Headache 10/16/2018  . Severe major depression without psychotic features ([place]) 09/22/2017  . Generalized anxiety disorder 09/22/2017  . DNR (do not resuscitate) discussion 05/10/2017  . Weakness generalized 04/04/2017  . Chest pain 03/24/2017  . AV block, [place] 1 03/24/2017  . CAD (coronary artery disease) 03/24/2017  . Chronic diastolic CHF (congestive heart failure) ([place]) 03/24/2017  . COPD (chronic obstructive pulmonary disease) ([place]) 03/24/2017  . NSTEMI (non-ST elevated myocardial infarction) ([place])   . Elevated troponin 03/09/2017  . Acute on chronic diastolic heart failure ([place])   . Tachycardia 03/08/2017  . Influenza A 02/04/2017  . Cor pulmonale, chronic (HCC)/ clinical dx  09/13/2016  . Orthostatic hypotension 09/26/2015  . Angina at rest [facility]) 09/24/2015  . BPH (benign prostatic hyperplasia) 09/13/2015   . GERD (gastroesophageal reflux disease) 09/13/2015  . Anxiety 09/13/2015  . Sinusitis, chronic 09/09/2015  . DOE (dyspnea on exertion) 04/15/2015  . Acute heart failure ([place]) 03/18/2015  . Right calf pain 06/18/2014  . Coronary artery disease involving coronary bypass graft of native heart with angina pectoris ([place])   . Peripheral vascular disease ([place])   . Essential hypertension   . Obstructive sleep apnea   . Rhinitis, chronic 01/29/2007  . COPD  GOLD IV/ 02/steroid dep  01/29/2007  . Chronic respiratory failure with hypoxia and hypercapnia ([place]) 01/29/2007  . Hyperlipidemia               Psychiatric History:  The patient does have a history of anxiety and depression.  Family Med/Psych History:  Family History  Problem Relation Age of Onset  . Heart disease Mother   . Heart disease Father        Before age 82  . Breast cancer Mother   . Cancer Mother        Breast cancer  . Hypertension Mother   . Other Mother        AAA  and   Amputation  . Heart attack Father   . Stroke Brother        x3, still living   . Peripheral vascular disease Brother   . Heart disease Brother   . Hyperlipidemia Brother   . Hypertension Brother   . Heart disease Brother   . Allergies Brother      Impression/DX:  [name] is an 87 year old male who was referred by Delice Lesch, MD who is following him due to right above-the-knee amputation and complicating factors.  He was seen in the inpatient unit following his amputation on 02/05/2020.  Since that time, the patient is continued to have ongoing medical issues but has begun having more sadness, anxiety, guilt and depressive symptomatology.  The patient is having intrusive memories and recall of old events that he begins to feel shame and guilt about even though they may have happened many decades ago.  Disposition/Plan:  We have set the patient up for individual psychotherapeutic interventions for issues of depression.  The patient has  recently started on Zoloft/sertraline and continues to take Xanax daily.  Diagnosis:    Major depressive disorder, recurrent episode, moderate (HCC)  Generalized anxiety disorder  Status post above-knee amputation of right lower extremity ([place])         Electronically Signed   _______________________ Ilean Skill, Psy.D. Clinical Neuropsychologist

## 2020-03-26 DIAGNOSIS — R310 Gross hematuria: Secondary | ICD-10-CM | POA: Diagnosis not present

## 2020-03-26 DIAGNOSIS — I739 Peripheral vascular disease, unspecified: Secondary | ICD-10-CM | POA: Diagnosis not present

## 2020-03-26 DIAGNOSIS — D494 Neoplasm of unspecified behavior of bladder: Secondary | ICD-10-CM | POA: Diagnosis not present

## 2020-03-26 DIAGNOSIS — N329 Bladder disorder, unspecified: Secondary | ICD-10-CM | POA: Diagnosis not present

## 2020-03-30 ENCOUNTER — Encounter (HOSPITAL_BASED_OUTPATIENT_CLINIC_OR_DEPARTMENT_OTHER): Payer: Medicare Other | Admitting: Internal Medicine

## 2020-03-30 ENCOUNTER — Other Ambulatory Visit: Payer: Self-pay

## 2020-03-30 DIAGNOSIS — Z951 Presence of aortocoronary bypass graft: Secondary | ICD-10-CM | POA: Diagnosis not present

## 2020-03-30 DIAGNOSIS — I739 Peripheral vascular disease, unspecified: Secondary | ICD-10-CM | POA: Diagnosis not present

## 2020-03-30 DIAGNOSIS — Z89611 Acquired absence of right leg above knee: Secondary | ICD-10-CM | POA: Diagnosis not present

## 2020-03-30 DIAGNOSIS — E11622 Type 2 diabetes mellitus with other skin ulcer: Secondary | ICD-10-CM | POA: Diagnosis not present

## 2020-03-30 DIAGNOSIS — L03116 Cellulitis of left lower limb: Secondary | ICD-10-CM | POA: Diagnosis not present

## 2020-03-30 DIAGNOSIS — L89153 Pressure ulcer of sacral region, stage 3: Secondary | ICD-10-CM | POA: Diagnosis not present

## 2020-03-30 NOTE — Patient Instructions (Addendum)
DUE TO COVID-19 ONLY ONE VISITOR IS ALLOWED TO COME WITH YOU AND STAY IN THE WAITING ROOM ONLY DURING PRE OP AND PROCEDURE DAY OF SURGERY. THE 1 VISITOR  MAY VISIT WITH YOU AFTER SURGERY IN YOUR PRIVATE ROOM DURING VISITING HOURS ONLY!  YOU NEED TO HAVE A COVID 19 TEST ON_3/25______ @_12 :55______, THIS TEST MUST BE DONE BEFORE SURGERY,  COVID TESTING SITE [place] [address], IT IS ON THE RIGHT GOING OUT [address] APPROXIMATELY  2 MINUTES PAST [employer] ON THE RIGHT. ONCE YOUR COVID TEST IS COMPLETED,  PLEASE BEGIN THE QUARANTINE INSTRUCTIONS AS OUTLINED IN YOUR HANDOUT.                [name]    Your procedure is scheduled on: 04/06/20   Report to [facility] Main  Entrance   Report to admitting at  11:30 AM     Call this number if you have problems the morning of surgery [phone]    Remember: Do not eat food after Midnight.  You may have clear liquids until 8:30 am   BRUSH YOUR TEETH MORNING OF SURGERY AND RINSE YOUR MOUTH OUT, NO CHEWING GUM CANDY OR MINTS.     Take these medicines the morning of surgery with A SIP OF WATER:   Zoloft, Metoprolol, Pantoprazole, Myrbetrq, Prednisone, Finesteride,  And xanax if needed  Use your eye drops as usual  ,Use your inhaler and bring it with you to the hospital                                 You may not have any metal on your body including hair pins and              piercings  Do not wear jewelry,, lotions, powders or , deodorant              Men may shave face and neck.   Do not bring valuables to the hospital. [place].  Contacts, dentures or bridgework may not be worn into surgery.      Patients discharged the day of surgery will not be allowed to drive home.   IF YOU ARE HAVING SURGERY AND GOING HOME THE SAME DAY, YOU MUST HAVE AN ADULT TO DRIVE YOU HOME AND BE WITH YOU FOR 24 HOURS.  YOU MAY GO HOME BY TAXI OR UBER  OR ORTHERWISE, BUT AN ADULT MUST ACCOMPANY YOU HOME AND STAY WITH YOU FOR 24 HOURS.  Name and phone number of your driver:  Special Instructions: N/A              Please read over the following fact sheets you were given: _____________________________________________________________________             [facility] - Preparing for Surgery Before surgery, you can play an important role.  Because skin is not sterile, your skin needs to be as free of germs as possible.  You can reduce the number of germs on your skin by washing with CHG (chlorahexidine gluconate) soap before surgery.  CHG is an antiseptic cleaner which kills germs and bonds with the skin to continue killing germs even after washing. Please DO NOT use if you have an allergy to CHG or antibacterial soaps.  If your skin becomes  reddened/irritated stop using the CHG and inform your nurse when you arrive at Short Stay.   You may shave your face/neck.  Please follow these instructions carefully:   1.  Shower with CHG Soap the night before surgery and the  morning of Surgery.  2.  If you choose to wash your hair, wash your hair first as usual with your  normal  shampoo.  3.  After you shampoo, rinse your hair and body thoroughly to remove the  shampoo.                                        4.  Use CHG as you would any other liquid soap.  You can apply chg directly  to the skin and wash                       Gently with a scrungie or clean washcloth.  5.  Apply the CHG Soap to your body ONLY FROM THE NECK DOWN.   Do not use on face/ open                           Wound or open sores. Avoid contact with eyes, ears mouth and genitals (private parts).                       Wash face,  Genitals (private parts) with your normal soap.             6.  Wash thoroughly, paying special attention to the area where your surgery  will be performed.  7.  Thoroughly rinse your body with warm water from the neck down.  8.  DO NOT shower/wash with  your normal soap after using and rinsing off  the CHG Soap.             9.  Pat yourself dry with a clean towel.            10.  Wear clean pajamas.            11.  Place clean sheets on your bed the night of your first shower and do not  sleep with pets. Day of Surgery : Do not apply any lotions/deodorants the morning of surgery.  Please wear clean clothes to the hospital/surgery center.  FAILURE TO FOLLOW THESE INSTRUCTIONS MAY RESULT IN THE CANCELLATION OF YOUR SURGERY PATIENT SIGNATURE_________________________________  NURSE SIGNATURE__________________________________  ________________________________________________________________________

## 2020-03-30 NOTE — Progress Notes (Signed)
[name], [name] ([id number]) Visit Report for 03/30/2020 HPI Details Patient Name: Date of Service: DO RN, [name] 03/30/2020 3:00 PM Medical Record Number: [id number] Patient Account Number: [id number] Date of Birth/Sex: Treating RN: [birth year] (87 y.o. [name] Primary Care Provider: [name] Other Clinician: Referring Provider: Treating Provider/Extender: [name] in Treatment: 20 History of Present Illness HPI Description: 01/30/18 on evaluation today patient presents for initial inspection concerning the skin tear of the right forearm. Fortunately there does not appear to be any evidence of infection and this occurred approximately three days ago. He has been using Vaseline over the region. With that being said the patient does have a history of cataracts COPD, hypertension, peripheral arterial disease, and osteoarthritis. He tells me that he does have a little bit of pain at the site but nothing too significant at this time which is good news. Fortunately this overall appears to be doing fairly well which is good news. No fevers, chills, nausea, or vomiting noted at this time. The biggest issue at this point is that the wound is of quite significant size. READMISSION 04/21/2019 This is a 87 year old man with multiple medical problems who was seen once in the clinic here in January 2020 with a skin tear on his right forearm seen by [name]. This problem this time started sometime recently. He was noted by podiatry to have erosions and wounds of his right foot on toes 1-3. He also saw [name] of dermatology who gave him Silvadene cream for what I think was felt to be a stasis dermatitis of his bilateral lower legs. At some point he was referred to [name]. He was also noted to have previous noninvasive arterial studies that showed no flow to either one of his toes and noncompressible ABIs bilaterally. [name] did noninvasive arterial  studies on him. These showed that he had greater than 75% stenosis in the proximal and mid common femoral artery. Monophasic flow in the posterior tibial and dorsalis pedis positions on the right foot. ABIs again were noncompressible. He was felt to have total occlusion of the superficial femoral artery on the right. The overall interpretation is that he had severe multilevel arterial occlusive disease. Absent femoral pulses and superficial femoral artery occlusions. In spite of this he was not felt to be candidate for arteriography given his bilateral femoral artery occlusions as well he would not be a candidate for endovascular approach secondary to his common femoral artery occlusions and proximal iliac disease. He was not felt to be a candidate for an open infra inguinal bypass. He therefore felt he would need to be monitored over time with the only option being a right below-knee amputation The patient complains currently of pain at night when he is up in bed. The pain in the right foot is better when he puts the leg down over the bed side. This is compatible with rest pain. He has very limited activity i.e. is even exhausted getting dressed in the morning because of cardiopulmonary issues therefore it is difficult to gauge claudication I believe he was referred here by Dr. [name] with one of the major issues is whether he would be a candidate for hyperbaric oxygen. He is not a diabetic. Dr. [name] did give him Silvadene cream for dry flaking skin in his bilateral lower extremities, however the patient is concerned about using this in the face of sulfa allergy. Podiatry had previously given him in a steroid cream  I do not believe these use this either Past medical history is extensive including basal cell and squamous cell skin cancers, combined congestive heart failure, O2 dependent COPD, cor pulmonale, venous stasis dermatitis, peripheral arterial disease, hearing loss, 4/20; the patient still  has a wound on the right first toe. This is on the medial aspect extended there is a second area on the plantar aspect. Most of what the patient talks about is claudication with minimal activity [however the patient is limited by his COPD], or even at rest at night. He is not getting a lot of sleep. We use silver collagen on this wound 5/4; the patient has a wound on the tip of his right great toe also on the medial aspect. Setting of severe nonrevascularizable PAD [see description in HPI]. He does not describe any change in his pain. We have been using silver collagen 5/18; 2-week follow-up. The patient has wound on the tip of his right great toe as well as the medial aspect. He has developed new wounds x2 on the right lateral foot and the right lateral calcaneus. These look like ischemic areas. The patient talks about claudication at night. He seems to be more comfortable during the day. We have been using silver collagen to the wounds. I have previously debrided the eschar on the toes however this comes right back. 6/1; 2-week follow-up. This patient has ischemic wounds on his right foot in the setting of nonunreconstructable PAD [previously reviewed by Dr. Dixon]. He does not really describe severe pain but he does have episodic pain in the right great toe. He has an area on the tip of the right great toe as well as the medial aspect of the right great toe. A new area on the tip of the right fourth toe. He has the area on the right lateral foot fortunately the area on the right lateral heel appears to have epithelialized over. We are using silver collagen to all wounds 06/23/2019 upon evaluation today patient appears to be doing about the same in regard to his foot ulcers. Unfortunately he has 2 new skin tears on his left elbow and the right forearm that were new as of today. He states that it was doing okay until they went to change the dressing and they put some collagen on it that got stuck and  somewhat pulled back the skin unfortunately. Fortunately there is no signs of active infection at this time. No fevers, chills, nausea, vomiting, or diarrhea. 6/29; the patient was hospitalized from 6/18 through 6/23 going in with increasing drainage from the wounds on his right foot. He underwent a angiogram on 06/30/2019 by Dr. Donzetta Matters which revealed multilevel arterial disease including occlusion of the right common femoral artery. He reconstitutes the profunda femoris and the below-knee popliteal artery and appears to have posterior tibial peroneal runoff to the ankle although there is heavy calcification of the popliteal below the knee. He was offered consideration of a right above-knee amputation, palliative and/or hospice care and consideration of a very high risk attempt at bypass. He did receive IV antibiotics. He comes in the clinic today again with the 2 areas that are necrotic on the left great toe and a small punched out area on the right lateral foot. A lot of the erythema on top of the right foot but no warmth. He has a lot of swelling here. The patient is literally tortured over this decision that he needs to make. I went over this with him in  exceptional detail. I do not believe he is a candidate for any form of open bypass surgery. I think his vascular doctors would agree with that. He still has pain at night when he puts his leg up on the bed that is relieved by putting his leg dependent. This is not different from what I remember. He is not septic there is no additional skin breakdown that I can see 7/8 the patient continues to have severe pain in the right foot at night making it difficult for him to rest. There is increasing erythema in the right foot but the foot is cold. I do not think this is an infection I think this is likely to be tissue necrosis from ischemia. I thought he would have made an up decision today about going forward with hospice he has not. He did not go to see Dr.  Doren Custard on 7/6. Again he wants to talk to me at length about amputations. I do not think he will do well after an above-knee amputation on the right. Furthermore I do not think he is a candidate for bypass surgery on the right leg 7/20; patient still having a lot of pain in the right foot. He saw Dr. Donzetta Matters on 7/16. He is not felt to be able to undergo a complex revascularization and I certainly agree with that. He was offered a right below-knee amputation but he has not made that decision. I have repetitively suggested hospice care if he will not undergo an amputation he is not decided he wants to go that route either. He arrives in the clinic today accompanied by his son who is a Stage manager and his wife. I think the patient has tissue breakdown in the dorsal foot secondary to ischemia although his son brought up that some of the erythema could be a contact dermatitis as they were apparently layering a large area of pure call over the wound area. I was not aware of this not supposed to be doing it in this fashion. In any case the erythema look better than last week and I am quite convinced this is not cellulitis READMISSION 11/11/2019 [name] is now an 87 year old man who we had in this clinic from [place] through July/21 largely as a result of ischemic wounds in his right foot. He was followed by vein and vascular [name]. His anatomy was not felt to be amenable to a revascularization. The patient had increasing ischemic pain but was very resistant to the notion of an amputation. Eventually he did go through with this on August 22 he will underwent an AKA by [name]. He went to rehabilitation. There he developed a small stage III lower sacral pressure ulcer and an area on his left lateral heel which is also probably a pressure ulcer. They have been using silver collagen on both wound areas. The patient states he is not in any unrelieved pain. He is in a wheelchair at home with a Roho cushion. He  is able to do his own transfers. I think he is probably putting far too much pressure on these areas Past medical history is reviewed. He has a history of CHF, severe PAD, hypertension, coronary artery disease status post CABG, COPD Gold stage IV on O2, hypothyroidism and a recent problem with urinary retention he now has a Foley catheter. He has had problems with left leg swelling and adjustment of his diuretics he is followed by Dr. Daneen Schick. The patient has severe PAD. On 06/30/2019 he had  an angiogram on the remaining left side he had heavily diseased femoral artery. His superficial femoral artery was flush occluded with runoff in the profunda. He was felt his SFA was likely occluded and reconstitutes the popliteal artery via runoff via the peroneal and posterior tibial artery. As noted he is not in a lot of pain. His last noninvasive study was on 6/20. On the left side this was noncompressible ABI but a TBI of only 0.20 with dampened monophasic waveforms 11/25/2019. Patient has 2 wound areas 1 on the left lateral heel and a ulcer on his coccyx. These are probably all pressure related although he has significant PAD complicating any attempt to heal a wound on the left heel. We have been using silver collagen on the wound and the coccyx silver alginate on the heel. 11/30; miraculously the left lateral heel wound has healed. The area on his coccyx is not much better. We have been using Prisma He apparently went back to see [name]. His wife paraphrasing says that he had terrible blood flow in the left lower extremity but he is not a candidate for revascularization. Fortunately had enough blood flow to heal the wound I will try to look at Dr. Mee Hives note by the time these next year 12/14; the area on his left lateral heel remains closed The area on his coccyx is about the same in terms of surface area and depth minimal debris however but it is still adherent. We have been using Iodoflex. They  stop me before I left the room to show me his right AKA stump. He tells me he is having a lot of pain in this area that is episodic it almost looks like there is bruising in this area but the bruising looks old I asked him about trauma but they were not aware of any. They see vascular tomorrow 12/28; the area on the coccyx looks absolutely no better. Completely lifeless surface of fibrinous debris and surrounding raised skin. We have been using Iodoflex this is simply not working. He had a fall when he got up forgetting that he was missing his right leg he has skin tears on the right humerus area just above the elbow and on the right forearm. 1/11; the area on the coccyx is about the same we have been using Santyl although the surface of this is no better. Small punched out wound. He also has skin tears on his arm which are drying up gradually. His wife had called urgently yesterday to be seen. Apparently he developed erythema and swelling of his left foot over the weekend and she was able to express a teaspoon of pus coming from the dorsal foot. She had called her podiatrist who prescribed Keflex however in response to advice from our office she took him to the emergency room. They apparently gave him IV antibiotics and discharged him on doxycycline. His white count was 14.5 with 92% neutrophils. He is not systemically unwell and does not really complain of a lot of pain 3/1; patient has not been here in about 7 weeks. In the interim he was hospitalized on 2 occasions from 01/23/2020 through 02/05/2020. The original admission was for cellulitis of the left leg he received IV antibiotics and Augmentin. It was noted that the right AKA stump broke down ultimately requiring a revision of the AKA by vein and vascular and he went to rehab medicine in hospital from 1/27 through 02/16/2020 He is back at home now. The only wound he has  is on the lower coccyx everything else is closed. He is not complaining of any  pain in the left leg although there is edema up into his mid thigh. He apparently has had his Lasix increased and this was felt to be secondary to congestive heart failure 3/22. The patient is followed here for a small wound on his lower sacrum/coccyx. He has been using silver collagen the wound is small with some depth. They asked me to look at a fairly large annular lesion on the left anterior lower leg this is not scaled or painful. It is not pruritic. The patient's wife tells me this just came up over the weekend past Electronic Signature(s) Signed: 03/30/2020 5:50:43 PM By: [name] MD Entered By: [name] on 03/30/2020 17:45:14 -------------------------------------------------------------------------------- Physical Exam Details Patient Name: Date of Service: DO RN, [name] 03/30/2020 3:00 PM Medical Record Number: [id number] Patient Account Number: [id number] Date of Birth/Sex: Treating RN: 04-06-1929 (87 y.o. [name] Primary Care Provider: [name] Other Clinician: Referring Provider: Treating Provider/Extender: [name] in Treatment: 20 Constitutional Sitting or standing Blood Pressure is within target range for patient.. Pulse regular and within target range for patient.Marland Kitchen Respirations regular, non-labored and within target range.. Temperature is normal and within the target range for the patient.Marland Kitchen Appears in no distress. Notes Wound exam; Small coccyx area still with the same amount of depth. Surface is not completely healthy. We already try attempted treatment of this with Santyl On the left leg he again he has 3+ pitting edema. In the mid aspect of this anteriorly a fairly large annular slightly erythematous area nonscaly nontender. Electronic Signature(s) Signed: 03/30/2020 5:50:43 PM By: [name] MD Entered By: [name] on 03/30/2020  17:46:20 -------------------------------------------------------------------------------- Physician Orders Details Patient Name: Date of Service: DO RN, [name] 03/30/2020 3:00 PM Medical Record Number: [id number] Patient Account Number: [id number] Date of Birth/Sex: Treating RN: Dec 13, 1929 (87 y.o. [name] Primary Care Provider: [name] Other Clinician: Referring Provider: Treating Provider/Extender: [name] in Treatment: 20 Verbal / Phone Orders: No Diagnosis Coding Follow-up Appointments Return appointment in 3 weeks. Bathing/ Shower/ Hygiene May shower and wash wound with soap and water. - with dressing changes only. Off-Loading Turn and reposition every 2 hours Non Wound Condition pply the following to affected area as directed: - Apply the anti-fungal cream to left leg. A Home Health New wound care orders this week; continue Home Health for wound care. May utilize formulary equivalent dressing for wound treatment orders unless otherwise specified. - HH to change 3x a week, and 2x a week when pt. comes to [facility]. Other Home Health Orders/Instructions: - [facility] Wound #15 - Sacrum Cleanser: Wound Cleanser (Home Health) Every Other Day/30 Days Discharge Instructions: Cleanse the wound with wound cleanser prior to applying a clean dressing using gauze sponges, not tissue or cotton balls. Peri-Wound Care: Skin Prep [facility]) Every Other Day/30 Days Discharge Instructions: Use skin prep as directed Prim Dressing: Iodosorb Gel 10 (gm) Tube Every Other Day/30 Days ary Discharge Instructions: Apply to wound bed as instructed Secondary Dressing: ComfortFoam Border, 3x3 in (silicone border) [facility]) Every Other Day/30 Days Discharge Instructions: Apply over primary dressing as directed. Patient Medications llergies: cefdinir, Sulfa (Sulfonamide Antibiotics), nitrofurantoin, ciprofloxacin, sertraline,  Levaquin, doxycycline, amoxicillin A Notifications Medication Indication Start End 03/30/2020 Lotrisone DOSE topical 1 %-0.05 % cream - cream topical to affected area lightly daily Electronic Signature(s) Signed: 03/30/2020  5:48:08 PM By: [name] MD Previous Signature: 03/30/2020 5:23:06 PM Version By: Rhae Hammock RN Entered By: [name] on 03/30/2020 17:48:07 -------------------------------------------------------------------------------- Problem List Details Patient Name: Date of Service: DO RN, [name] 03/30/2020 3:00 PM Medical Record Number: [id number] Patient Account Number: [id number] Date of Birth/Sex: Treating RN: [birth year] (87 y.o. [name], [name] Primary Care Provider: [name] Other Clinician: Referring Provider: Treating Provider/Extender: [name] in Treatment: 20 Active Problems ICD-10 Encounter Code Description Active Date MDM Diagnosis L89.153 Pressure ulcer of sacral region, stage 3 11/11/2019 No Yes Z89.611 Acquired absence of right leg above knee 11/11/2019 No Yes S40.022D Contusion of left upper arm, subsequent encounter 01/06/2020 No Yes Inactive Problems ICD-10 Code Description Active Date Inactive Date L89.623 Pressure ulcer of left heel, stage 3 11/11/2019 11/11/2019 L03.116 Cellulitis of left lower limb 01/20/2020 01/20/2020 Resolved Problems Electronic Signature(s) Signed: 03/30/2020 5:50:43 PM By: [name] MD Entered By: [name] on 03/30/2020 17:43:49 -------------------------------------------------------------------------------- Progress Note Details Patient Name: Date of Service: DO RN, [name] 03/30/2020 3:00 PM Medical Record Number: [id number] Patient Account Number: [id number] Date of Birth/Sex: Treating RN: Aug 17, 1929 (87 y.o. [name] Primary Care Provider: [name] Other Clinician: Referring Provider: Treating Provider/Extender: [name] in Treatment: 20 Subjective History of Present Illness (HPI) 01/30/18 on evaluation today patient presents for initial inspection concerning the skin tear of the right forearm. Fortunately there does not appear to be any evidence of infection and this occurred approximately three days ago. He has been using Vaseline over the region. With that being said the patient does have a history of cataracts COPD, hypertension, peripheral arterial disease, and osteoarthritis. He tells me that he does have a little bit of pain at the site but nothing too significant at this time which is good news. Fortunately this overall appears to be doing fairly well which is good news. No fevers, chills, nausea, or vomiting noted at this time. The biggest issue at this point is that the wound is of quite significant size. READMISSION 04/21/2019 This is a 87 year old man with multiple medical problems who was seen once in the clinic here in January 2020 with a skin tear on his right forearm seen by [name]. This problem this time started sometime recently. He was noted by podiatry to have erosions and wounds of his right foot on toes 1-3. He also saw [name] of dermatology who gave him Silvadene cream for what I think was felt to be a stasis dermatitis of his bilateral lower legs. At some point he was referred to [name]. He was also noted to have previous noninvasive arterial studies that showed no flow to either one of his toes and noncompressible ABIs bilaterally. [name] did noninvasive arterial studies on him. These showed that he had greater than 75% stenosis in the proximal and mid common femoral artery. Monophasic flow in the posterior tibial and dorsalis pedis positions on the right foot. ABIs again were noncompressible. He was felt to have total occlusion of the superficial femoral artery on the right. The overall interpretation is that he had severe multilevel arterial  occlusive disease. Absent femoral pulses and superficial femoral artery occlusions. In spite of this he was not felt to be candidate for arteriography given his bilateral femoral artery occlusions as well he would not be a candidate for endovascular approach secondary to his common femoral artery occlusions and proximal iliac disease. He was not  felt to be a candidate for an open infra inguinal bypass. He therefore felt he would need to be monitored over time with the only option being a right below-knee amputation The patient complains currently of pain at night when he is up in bed. The pain in the right foot is better when he puts the leg down over the bed side. This is compatible with rest pain. He has very limited activity i.e. is even exhausted getting dressed in the morning because of cardiopulmonary issues therefore it is difficult to gauge claudication I believe he was referred here by Dr. [name] with one of the major issues is whether he would be a candidate for hyperbaric oxygen. He is not a diabetic. Dr. [name] did give him Silvadene cream for dry flaking skin in his bilateral lower extremities, however the patient is concerned about using this in the face of sulfa allergy. 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 This is on the medial aspect extended there is a second area on the plantar aspect. Most of what the patient talks about is claudication with minimal activity [however the patient is limited by his COPD], or even at rest at night. He is not getting a lot of sleep. We use silver collagen on this wound 5/4; the patient has a wound on the tip of his  right great toe also on the medial aspect. Setting of severe nonrevascularizable PAD [see description in HPI]. He does not describe any change in his pain. We have been using silver collagen 5/18; 2-week follow-up. The patient has wound on the tip of his right great toe as well as the medial aspect. He has developed new wounds x2 on the right lateral foot and the right lateral calcaneus. These look like ischemic areas. The patient talks about claudication at night. He seems to be more comfortable during the day. We have been using silver collagen to the wounds. I have previously debrided the eschar on the toes however this comes right back. 6/1; 2-week follow-up. This patient has ischemic wounds on his right foot in the setting of nonunreconstructable PAD [previously reviewed by Dr. Dixon]. He does not really describe severe pain but he does have episodic pain in the right great toe. He has an area on the tip of the right great toe as well as the medial aspect of the right great toe. A new area on the tip of the right fourth toe. He has the area on the right lateral foot fortunately the area on the right lateral heel appears to have epithelialized over. We are using silver collagen to all wounds 06/23/2019 upon evaluation today patient appears to be doing about the same in regard to his foot ulcers. Unfortunately he has 2 new skin tears on his left elbow and the right forearm that were new as of today. He states that it was doing okay until they went to change the dressing and they put some collagen on it that got stuck and somewhat pulled back the skin unfortunately. Fortunately there is no signs of active infection at this time. No fevers, chills, nausea, vomiting, or diarrhea. 6/29; the patient was hospitalized from 6/18 through 6/23 going in with  increasing drainage from the wounds on his right foot. He underwent a angiogram on 06/30/2019 by Dr. Donzetta Matters which revealed multilevel arterial disease  including occlusion of the right common femoral artery. He reconstitutes the profunda femoris and the below-knee popliteal artery and appears to have posterior tibial peroneal runoff to the ankle although there is heavy calcification of the popliteal below the knee. He was offered consideration of a right above-knee amputation, palliative and/or hospice care and consideration of a very high risk attempt at bypass. He did receive IV antibiotics. He comes in the clinic today again with the 2 areas that are necrotic on the left great toe and a small punched out area on the right lateral foot. A lot of the erythema on top of the right foot but no warmth. He has a lot of swelling here. The patient is literally tortured over this decision that he needs to make. I went over this with him in exceptional detail. I do not believe he is a candidate for any form of open bypass surgery. I think his vascular doctors would agree with that. He still has pain at night when he puts his leg up on the bed that is relieved by putting his leg dependent. This is not different from what I remember. He is not septic there is no additional skin breakdown that I can see 7/8 the patient continues to have severe pain in the right foot at night making it difficult for him to rest. There is increasing erythema in the right foot but the foot is cold. I do not think this is an infection I think this is likely to be tissue necrosis from ischemia. I thought he would have made an up decision today about going forward with hospice he has not. He did not go to see [name] on 7/6. Again he wants to talk to me at length about amputations. I do not think he will do well after an above-knee amputation on the right. Furthermore I do not think he is a candidate for bypass surgery on the right leg 7/20; patient still having a lot of pain in the right foot. He saw Dr. Donzetta Matters on 7/16. He is not felt to be able to undergo a complex revascularization  and I certainly agree with that. He was offered a right below-knee amputation but he has not made that decision. I have repetitively suggested hospice care if he will not undergo an amputation he is not decided he wants to go that route either. He arrives in the clinic today accompanied by his son who is a Stage manager and his wife. I think the patient has tissue breakdown in the dorsal foot secondary to ischemia although his son brought up that some of the erythema could be a contact dermatitis as they were apparently layering a large area of pure call over the wound area. I was not aware of this not supposed to be doing it in this fashion. In any case the erythema look better than last week and I am quite convinced this is not cellulitis READMISSION 11/11/2019 [name] is now an 87 year old man who we had in this clinic from [place] through July/21 largely as a result of ischemic wounds in his right foot. He was followed by vein and vascular [name]. His anatomy was not felt to be amenable to a revascularization. The patient had increasing ischemic pain but was very resistant to the notion of an amputation. Eventually he did go through with this  on August 22 he will underwent an AKA by [name]. He went to rehabilitation. There he developed a small stage III lower sacral pressure ulcer and an area on his left lateral heel which is also probably a pressure ulcer. They have been using silver collagen on both wound areas. The patient states he is not in any unrelieved pain. He is in a wheelchair at home with a Roho cushion. He is able to do his own transfers. I think he is probably putting far too much pressure on these areas Past medical history is reviewed. He has a history of CHF, severe PAD, hypertension, coronary artery disease status post CABG, COPD Gold stage IV on O2, hypothyroidism and a recent problem with urinary retention he now has a Foley catheter. He has had problems with left leg  swelling and adjustment of his diuretics he is followed by Dr. Daneen Schick. The patient has severe PAD. On 06/30/2019 he had an angiogram on the remaining left side he had heavily diseased femoral artery. His superficial femoral artery was flush occluded with runoff in the profunda. He was felt his SFA was likely occluded and reconstitutes the popliteal artery via runoff via the peroneal and posterior tibial artery. As noted he is not in a lot of pain. His last noninvasive study was on 6/20. On the left side this was noncompressible ABI but a TBI of only 0.20 with dampened monophasic waveforms 11/25/2019. Patient has 2 wound areas 1 on the left lateral heel and a ulcer on his coccyx. These are probably all pressure related although he has significant PAD complicating any attempt to heal a wound on the left heel. We have been using silver collagen on the wound and the coccyx silver alginate on the heel. 11/30; miraculously the left lateral heel wound has healed. The area on his coccyx is not much better. We have been using Prisma He apparently went back to see [name]. His wife paraphrasing says that he had terrible blood flow in the left lower extremity but he is not a candidate for revascularization. Fortunately had enough blood flow to heal the wound I will try to look at Dr. Mee Hives note by the time these next year 12/14; the area on his left lateral heel remains closed ooThe area on his coccyx is about the same in terms of surface area and depth minimal debris however but it is still adherent. We have been using Iodoflex. ooThey stop me before I left the room to show me his right AKA stump. He tells me he is having a lot of pain in this area that is episodic it almost looks like there is bruising in this area but the bruising looks old I asked him about trauma but they were not aware of any. They see vascular tomorrow 12/28; the area on the coccyx looks absolutely no better. Completely lifeless  surface of fibrinous debris and surrounding raised skin. We have been using Iodoflex this is simply not working. ooHe had a fall when he got up forgetting that he was missing his right leg he has skin tears on the right humerus area just above the elbow and on the right forearm. 1/11; the area on the coccyx is about the same we have been using Santyl although the surface of this is no better. Small punched out wound. He also has skin tears on his arm which are drying up gradually. His wife had called urgently yesterday to be seen. Apparently he developed erythema and  swelling of his left foot over the weekend and she was able to express a teaspoon of pus coming from the dorsal foot. She had called her podiatrist who prescribed Keflex however in response to advice from our office she took him to the emergency room. They apparently gave him IV antibiotics and discharged him on doxycycline. His white count was 14.5 with 92% neutrophils. He is not systemically unwell and does not really complain of a lot of pain 3/1; patient has not been here in about 7 weeks. In the interim he was hospitalized on 2 occasions from 01/23/2020 through 02/05/2020. The original admission was for cellulitis of the left leg he received IV antibiotics and Augmentin. It was noted that the right AKA stump broke down ultimately requiring a revision of the AKA by vein and vascular and he went to rehab medicine in hospital from 1/27 through 02/16/2020 He is back at home now. The only wound he has is on the lower coccyx everything else is closed. He is not complaining of any pain in the left leg although there is edema up into his mid thigh. He apparently has had his Lasix increased and this was felt to be secondary to congestive heart failure 3/22. The patient is followed here for a small wound on his lower sacrum/coccyx. He has been using silver collagen the wound is small with some depth. ooThey asked me to look at a fairly large  annular lesion on the left anterior lower leg this is not scaled or painful. It is not pruritic. The patient's wife tells me this just came up over the weekend past Objective Constitutional Sitting or standing Blood Pressure is within target range for patient.. Pulse regular and within target range for patient.Marland Kitchen Respirations regular, non-labored and within target range.. Temperature is normal and within the target range for the patient.Marland Kitchen Appears in no distress. Vitals Time Taken: 3:27 PM, Height: 69 in, Temperature: 98.0 F, Pulse: 83 bpm, Respiratory Rate: 20 breaths/min, Blood Pressure: 151/69 mmHg. General Notes: Wound exam; ooSmall coccyx area still with the same amount of depth. Surface is not completely healthy. We already try attempted treatment of this with Santyl ooOn the left leg he again he has 3+ pitting edema. In the mid aspect of this anteriorly a fairly large annular slightly erythematous area nonscaly nontender. Integumentary (Hair, Skin) Wound #15 status is Open. Original cause of wound was Pressure Injury. The date acquired was: 08/01/2019. The wound has been in treatment 20 weeks. The wound is located on the Sacrum. The wound measures 0.3cm length x 0.2cm width x 0.3cm depth; 0.047cm^2 area and 0.014cm^3 volume. There is Fat Layer (Subcutaneous Tissue) exposed. There is no tunneling or undermining noted. There is a small amount of serous drainage noted. The wound margin is epibole. There is medium (34-66%) pink, pale granulation within the wound bed. There is a medium (34-66%) amount of necrotic tissue within the wound bed including Adherent Slough. Assessment Active Problems ICD-10 Pressure ulcer of sacral region, stage 3 Acquired absence of right leg above knee Contusion of left upper arm, subsequent encounter Plan Follow-up Appointments: Return appointment in 3 weeks. Bathing/ Shower/ Hygiene: May shower and wash wound with soap and water. - with dressing changes  only. Off-Loading: Turn and reposition every 2 hours Non Wound Condition: Apply the following to affected area as directed: - Apply the anti-fungal cream to left leg. Home Health: New wound care orders this week; continue Home Health for wound care. May utilize formulary equivalent dressing  for wound treatment orders unless otherwise specified. - HH to change 3x a week, and 2x a week when pt. comes to [facility]. Other Home Health Orders/Instructions: - [facility] The following medication(s) was prescribed: Lotrisone topical 1 %-0.05 % cream cream topical to affected area lightly daily starting 03/30/2020 WOUND #15: - Sacrum Wound Laterality: Cleanser: Wound Cleanser [facility]) Every Other Day/30 Days Discharge Instructions: Cleanse the wound with wound cleanser prior to applying a clean dressing using gauze sponges, not tissue or cotton balls. Peri-Wound Care: Skin Prep [facility]) Every Other Day/30 Days Discharge Instructions: Use skin prep as directed Prim Dressing: Iodosorb Gel 10 (gm) Tube Every Other Day/30 Days ary Discharge Instructions: Apply to wound bed as instructed Secondary Dressing: ComfortFoam Border, 3x3 in (silicone border) [facility]) Every Other Day/30 Days Discharge Instructions: Apply over primary dressing as directed. 1. I change the dressing to the coccyx to Iodoflex/Iodosorb ointment. Hopefully this will allow for some debridement of the surface perhaps some tissue adhesion 2. With regards to the annular area on the left anterior lower leg this was atypical I wondered about tinea. Certainly no evidence of a systemic problem. He may have another one developing just distal. I will give him a course of Lotrisone to see if this helps. 3. He has very significant edema in the left lower leg. They have stockings but I do not think they are using the Electronic Signature(s) Signed: 03/30/2020 5:50:43 PM By: [name] MD Entered By: [name] on 03/30/2020  17:49:08 -------------------------------------------------------------------------------- SuperBill Details Patient Name: Date of Service: DO RN, [name] 03/30/2020 Medical Record Number: [id number] Patient Account Number: [id number] Date of Birth/Sex: Treating RN: Oct 03, 1929 (87 y.o. [name], [name] Primary Care Provider: [name] Other Clinician: Referring Provider: Treating Provider/Extender: [name] in Treatment: 20 Diagnosis Coding ICD-10 Codes Code Description [phone] Pressure ulcer of sacral region, stage 3 Z89.611 Acquired absence of right leg above knee S40.022D Contusion of left upper arm, subsequent encounter Facility Procedures CPT4 Code: [id number] Description: 9213 - WOUND CARE VISIT-LEV 3 EST PT Modifier: Quantity: 1 Physician Procedures : CPT4 Code Description Modifier [id number] - WC PHYS LEVEL 3 - EST PT ICD-10 Diagnosis Description L89.153 Pressure ulcer of sacral region, stage 3 Quantity: 1 Electronic Signature(s) Signed: 03/30/2020 5:50:43 PM By: [name] MD Previous Signature: 03/30/2020 5:23:06 PM Version By: Rhae Hammock RN Entered By: [name] on 03/30/2020 17:49:33

## 2020-03-31 ENCOUNTER — Encounter (HOSPITAL_COMMUNITY)
Admission: RE | Admit: 2020-03-31 | Discharge: 2020-03-31 | Disposition: A | Discharge status: Home / Self Care | Payer: Medicare Other | Source: Ambulatory Visit | Attending: Family Medicine | Admitting: Family Medicine

## 2020-03-31 ENCOUNTER — Other Ambulatory Visit: Payer: Self-pay

## 2020-03-31 ENCOUNTER — Encounter (HOSPITAL_COMMUNITY): Payer: Self-pay

## 2020-03-31 DIAGNOSIS — Z01818 Encounter for other preprocedural examination: Secondary | ICD-10-CM | POA: Insufficient documentation

## 2020-03-31 DIAGNOSIS — Z01812 Encounter for preprocedural laboratory examination: Secondary | ICD-10-CM | POA: Insufficient documentation

## 2020-03-31 HISTORY — DX: Depression, unspecified: F32.A

## 2020-03-31 NOTE — Progress Notes (Signed)
COVID Vaccine Completed:Yes Date COVID Vaccine completed:03/12/19-booster 11/18/19 COVID vaccine manufacturer: Pf[employer]     PCP - [name] Cardiologist - [name]  Pulmonologist-Dr. Jerilynn MagesMelvyn Novas LOV  02/18/20-epic  Chest x-ray - 02/08/20-epic EKG - 02/13/20-epic Stress Test -  ECHO - 01/28/20-epic Cardiac Cath - peripheral vas cath 06/30/19-epic, CABG x2- 2003 Pacemaker/ICD device last checked:  Sleep Study - yes CPAP - He uses a Trilogy 100 Ventilator at night and O2( 2 lt) during the day.  Fasting Blood Sugar -NA  Checks Blood Sugar _____ times a day  Blood Thinner Instructions:ASA81/ [name] Aspirin Instructions:Stop 5 days prior to DOS./[name] Last Dose:03/24/20  Anesthesia review:   Patient denies shortness of breath, fever, cough and chest pain at PAT appointment Phone interview with wife.  Patient verbalized understanding of instructions that were given to them at the PAT appointment. Patient was also instructed that they will need to review over the PAT instructions again at home before surgery.Yes Pt has a revision of AKA 01/27/20 with a transfusion on 02/06/20. He had A-fib/Flutter after that surgery. His EKG done on 02/13/20 was SR with PACs. Pt is in chronic resp failure and has had an increase of congestion and mucous as well as edema in his lt leg since around 03/24/20.   He takes lasix but the wife said that she will call Dr. [name] today. [name] is anxious and fearful about surgery because he had extensive bleeding after a prostate biopsy and his AKA revision.

## 2020-03-31 NOTE — Progress Notes (Signed)
[name], [name] ([id number]) Visit Report for 03/30/2020 Arrival Information Details Patient Name: Date of Service: DO RN, [place] V ID L. 03/30/2020 3:00 PM Medical Record Number: [id number] Patient Account Number: [id number] Date of Birth/Sex: Treating RN: 07/24/1929 (87 y.o. [name], [name] Primary Care Odilia Damico: [name] Other Clinician: Referring Masiyah Jorstad: Treating Reagann Dolce/Extender: [name] in Treatment: 20 Visit Information History Since Last Visit Added or deleted any medications: No Patient Arrived: Wheel Chair Any new allergies or adverse reactions: No Arrival Time: 15:26 Had a fall or experienced change in No Accompanied By: wife activities of daily living that may affect Transfer Assistance: None risk of falls: Patient Identification Verified: Yes Signs or symptoms of abuse/neglect since last visito No Secondary Verification Process Completed: Yes Hospitalized since last visit: No Patient Requires Transmission-Based Precautions: No Implantable device outside of the clinic excluding No Patient Has Alerts: Yes cellular tissue based products placed in the center Patient Alerts: L ABI N/C, TBI = .20 since last visit: Has Dressing in Place as Prescribed: Yes Pain Present Now: No Electronic Signature(s) Signed: 03/31/2020 7:45:09 AM By: [name] Entered By: [name] on 03/30/2020 15:27:04 -------------------------------------------------------------------------------- Clinic Level of Care Assessment Details Patient Name: Date of Service: DO RN, [name] 03/30/2020 3:00 PM Medical Record Number: [id number] Patient Account Number: [id number] Date of Birth/Sex: Treating RN: [birth year] (87 y.o. [name], [name] Primary Care Cordarius Benning: [name] Other Clinician: Referring Donell Sliwinski: Treating Dhani Dannemiller/Extender: [name] in Treatment: 20 Clinic Level of Care Assessment Items TOOL 4 Quantity  Score X- 1 0 Use when only an EandM is performed on FOLLOW-UP visit ASSESSMENTS - Nursing Assessment / Reassessment X- 1 10 Reassessment of Co-morbidities (includes updates in patient status) X- 1 5 Reassessment of Adherence to Treatment Plan ASSESSMENTS - Wound and Skin A ssessment / Reassessment X - Simple Wound Assessment / Reassessment - one wound 1 5 []  - 0 Complex Wound Assessment / Reassessment - multiple wounds X- 1 10 Dermatologic / Skin Assessment (not related to wound area) ASSESSMENTS - Focused Assessment []  - 0 Circumferential Edema Measurements - multi extremities []  - 0 Nutritional Assessment / Counseling / Intervention []  - 0 Lower Extremity Assessment (monofilament, tuning fork, pulses) []  - 0 Peripheral Arterial Disease Assessment (using hand held doppler) ASSESSMENTS - Ostomy and/or Continence Assessment and Care []  - 0 Incontinence Assessment and Management []  - 0 Ostomy Care Assessment and Management (repouching, etc.) PROCESS - Coordination of Care X - Simple Patient / Family Education for ongoing care 1 15 []  - 0 Complex (extensive) Patient / Family Education for ongoing care X- 1 10 Staff obtains Programmer, systems, Records, T Results / Process Orders est X- 1 10 Staff telephones HHA, Nursing Homes / Clarify orders / etc []  - 0 Routine Transfer to another Facility (non-emergent condition) []  - 0 Routine Hospital Admission (non-emergent condition) []  - 0 New Admissions / Biomedical engineer / Ordering NPWT Apligraf, etc. , []  - 0 Emergency Hospital Admission (emergent condition) X- 1 10 Simple Discharge Coordination []  - 0 Complex (extensive) Discharge Coordination PROCESS - Special Needs []  - 0 Pediatric / Minor Patient Management []  - 0 Isolation Patient Management []  - 0 Hearing / Language / Visual special needs []  - 0 Assessment of Community assistance (transportation, D/C planning, etc.) []  - 0 Additional assistance / Altered  mentation []  - 0 Support Surface(s) Assessment (bed, cushion, seat, etc.) INTERVENTIONS - Wound Cleansing / Measurement X - Simple Wound Cleansing - one wound  1 5 []  - 0 Complex Wound Cleansing - multiple wounds X- 1 5 Wound Imaging (photographs - any number of wounds) []  - 0 Wound Tracing (instead of photographs) X- 1 5 Simple Wound Measurement - one wound []  - 0 Complex Wound Measurement - multiple wounds INTERVENTIONS - Wound Dressings X - Small Wound Dressing one or multiple wounds 1 10 []  - 0 Medium Wound Dressing one or multiple wounds []  - 0 Large Wound Dressing one or multiple wounds X- 1 5 Application of Medications - topical []  - 0 Application of Medications - injection INTERVENTIONS - Miscellaneous []  - 0 External ear exam []  - 0 Specimen Collection (cultures, biopsies, blood, body fluids, etc.) []  - 0 Specimen(s) / Culture(s) sent or taken to Lab for analysis []  - 0 Patient Transfer (multiple staff / Civil Service fast streamer / Similar devices) []  - 0 Simple Staple / Suture removal (25 or less) []  - 0 Complex Staple / Suture removal (26 or more) []  - 0 Hypo / Hyperglycemic Management (close monitor of Blood Glucose) []  - 0 Ankle / Brachial Index (ABI) - do not check if billed separately X- 1 5 Vital Signs Has the patient been seen at the hospital within the last three years: Yes Total Score: 110 Level Of Care: New/Established - Level 3 Electronic Signature(s) Signed: 03/30/2020 5:23:06 PM By: Rhae Hammock RN Entered By: Rhae Hammock on 03/30/2020 16:33:56 -------------------------------------------------------------------------------- Encounter Discharge Information Details Patient Name: Date of Service: DO RN, [name] 03/30/2020 3:00 PM Medical Record Number: [id number] Patient Account Number: [id number] Date of Birth/Sex: Treating RN: 19-Jul-1929 (87 y.o. [name] Primary Care Celestial Barnfield: [name] Other Clinician: Referring  Draydon Clairmont: Treating Gearl Baratta/Extender: [name] in Treatment: 20 Encounter Discharge Information Items Discharge Condition: Stable Ambulatory Status: Wheelchair Discharge Destination: Home Transportation: Private Auto Accompanied By: wife Schedule Follow-up Appointment: Yes Clinical Summary of Care: Provided on 03/30/2020 Form Type Recipient Paper Patient Patient Electronic Signature(s) Signed: 03/30/2020 4:35:58 PM By: [name] Entered By: [name] on 03/30/2020 16:35:58 -------------------------------------------------------------------------------- Lower Extremity Assessment Details Patient Name: Date of Service: DO RN, [name] 03/30/2020 3:00 PM Medical Record Number: [id number] Patient Account Number: [id number] Date of Birth/Sex: Treating RN: [birth year] (87 y.o. [name] Primary Care Lerline Valdivia: [name] Other Clinician: Referring Raiquan Chandler: Treating Bacilio Abascal/Extender: [name] in Treatment: 20 Notes N/A: Sacral Wound Electronic Signature(s) Signed: 03/30/2020 4:34:55 PM By: [name] Entered By: [name] on 03/30/2020 16:34:54 -------------------------------------------------------------------------------- Multi Wound Chart Details Patient Name: Date of Service: DO RN, [name] 03/30/2020 3:00 PM Medical Record Number: [id number] Patient Account Number: [id number] Date of Birth/Sex: Treating RN: 11-16-1929 (87 y.o. [name], [name] Primary Care Josette Shimabukuro: [name] Other Clinician: Referring Morton Simson: Treating Lenn Volker/Extender: [name] in Treatment: 20 Vital Signs Height(in): 73 Pulse(bpm): 25 Weight(lbs): Blood Pressure(mmHg): 151/69 Body Mass Index(BMI): Temperature(F): 98.0 Respiratory Rate(breaths/min): 20 Photos: [N/A:N/A] Sacrum N/A N/A Wound Location: Pressure Injury N/A N/A Wounding Event: Pressure Ulcer N/A N/A Primary  Etiology: Cataracts, Asthma, Chronic N/A N/A Comorbid History: Obstructive Pulmonary Disease (COPD), Congestive Heart Failure, Coronary Artery Disease, Hypertension, Peripheral Arterial Disease, Osteoarthritis, Confinement Anxiety 08/01/2019 N/A N/A Date Acquired: 20 N/A N/A Weeks of Treatment: Open N/A N/A Wound Status: 0.3x0.2x0.3 N/A N/A Measurements L x W x D (cm) 0.047 N/A N/A A (cm) : rea 0.014 N/A N/A Volume (cm) : 78.60% N/A N/A % Reduction in A rea: 36.40% N/A N/A % Reduction in Volume:  Category/Stage III N/A N/A Classification: Small N/A N/A Exudate A mount: Serous N/A N/A Exudate Type: amber N/A N/A Exudate Color: Epibole N/A N/A Wound Margin: Medium (34-66%) N/A N/A Granulation A mount: Pink, Pale N/A N/A Granulation Quality: Medium (34-66%) N/A N/A Necrotic A mount: Fat Layer (Subcutaneous Tissue): Yes N/A N/A Exposed Structures: Fascia: No Tendon: No Muscle: No Joint: No Bone: No Medium (34-66%) N/A N/A Epithelialization: Treatment Notes Wound #15 (Sacrum) Cleanser Wound Cleanser Discharge Instruction: Cleanse the wound with wound cleanser prior to applying a clean dressing using gauze sponges, not tissue or cotton balls. Peri-Wound Care Skin Prep Discharge Instruction: Use skin prep as directed Topical Primary Dressing Iodosorb Gel 10 (gm) Tube Discharge Instruction: Apply to wound bed as instructed Secondary Dressing ComfortFoam Border, 3x3 in (silicone border) Discharge Instruction: Apply over primary dressing as directed. Secured With Compression Wrap Compression Stockings Environmental education officer) Signed: 03/30/2020 5:50:43 PM By: [name] MD Signed: 03/31/2020 5:50:40 PM By: Rhae Hammock RN Entered By: [name] on 03/30/2020 17:44:00 -------------------------------------------------------------------------------- Multi-Disciplinary Care Plan Details Patient Name: Date of Service: DO RN, [name] ID  L. 03/30/2020 3:00 PM Medical Record Number: [id number] Patient Account Number: [id number] Date of Birth/Sex: Treating RN: Mar 08, 1929 (87 y.o. [name], [name] Primary Care Uchechukwu Dhawan: [name] Other Clinician: Referring Undra Trembath: Treating Letta Cargile/Extender: [name] in Treatment: 20 Active Inactive Wound/Skin Impairment Nursing Diagnoses: Knowledge deficit related to ulceration/compromised skin integrity Goals: Patient/caregiver will verbalize understanding of skin care regimen Date Initiated: 11/11/2019 Target Resolution Date: 04/09/2020 Goal Status: Active Ulcer/skin breakdown will have a volume reduction of 30% by week 4 Date Initiated: 11/11/2019 Target Resolution Date: 04/09/2020 Goal Status: Active Interventions: Assess patient/caregiver ability to obtain necessary supplies Assess patient/caregiver ability to perform ulcer/skin care regimen upon admission and as needed Assess ulceration(s) every visit Notes: Electronic Signature(s) Signed: 03/30/2020 5:23:06 PM By: Rhae Hammock RN Entered By: Rhae Hammock on 03/30/2020 16:30:27 -------------------------------------------------------------------------------- Pain Assessment Details Patient Name: Date of Service: DO RN, [name] 03/30/2020 3:00 PM Medical Record Number: [id number] Patient Account Number: [id number] Date of Birth/Sex: Treating RN: 1929/03/17 (87 y.o. [name], [name] Primary Care Aikam Hellickson: [name] Other Clinician: Referring Jakeya Gherardi: Treating Kosta Schnitzler/Extender: [name] in Treatment: 20 Active Problems Location of Pain Severity and Description of Pain Patient Has Paino No Site Locations Pain Management and Medication Current Pain Management: Electronic Signature(s) Signed: 03/30/2020 5:23:06 PM By: Rhae Hammock RN Signed: 03/31/2020 7:45:09 AM By: [name] Entered By: [name] on 03/30/2020  15:27:31 -------------------------------------------------------------------------------- Patient/Caregiver Education Details Patient Name: Date of Service: DO RN, [name] ID [name] 3/22/2022andnbsp3:00 PM Medical Record Number: [id number] Patient Account Number: [id number] Date of Birth/Gender: Treating RN: Aug 15, 1929 (87 y.o. [name] Primary Care Physician: [name] Other Clinician: Referring Physician: Treating Physician/Extender: [name] in Treatment: 20 Education Assessment Education Provided To: Patient Education Topics Provided Wound/Skin Impairment: Responses: State content correctly [employer]) Signed: 03/30/2020 5:23:06 PM By: Rhae Hammock RN Entered By: Rhae Hammock on 03/30/2020 16:32:53 -------------------------------------------------------------------------------- Wound Assessment Details Patient Name: Date of Service: DO RN, [name] 03/30/2020 3:00 PM Medical Record Number: [id number] Patient Account Number: [id number] Date of Birth/Sex: Treating RN: Dec 12, 1929 (87 y.o. [name] Primary Care Herve Haug: [name] Other Clinician: Referring Khizar Fiorella: Treating Thelbert Gartin/Extender: [name] in Treatment: 20 Wound Status Wound Number: 15 Primary Pressure Ulcer Etiology: Wound Location: Sacrum Wound Open Wounding Event: Pressure Injury Status: Date Acquired: 08/01/2019 Comorbid  Cataracts, Asthma, Chronic Obstructive Pulmonary Disease Weeks Of Treatment: 20 History: (COPD), Congestive Heart Failure, Coronary Artery Disease, Clustered Wound: No Hypertension, Peripheral Arterial Disease, Osteoarthritis, Confinement Anxiety Photos Wound Measurements Length: (cm) 0.3 Width: (cm) 0.2 Depth: (cm) 0.3 Area: (cm) 0.047 Volume: (cm) 0.014 % Reduction in Area: 78.6% % Reduction in Volume: 36.4% Epithelialization: Medium (34-66%) Tunneling: No Undermining:  No Wound Description Classification: Category/Stage III Wound Margin: Epibole Exudate Amount: Small Exudate Type: Serous Exudate Color: amber Foul Odor After Cleansing: No Slough/Fibrino Yes Wound Bed Granulation Amount: Medium (34-66%) Exposed Structure Granulation Quality: Pink, Pale Fascia Exposed: No Necrotic Amount: Medium (34-66%) Fat Layer (Subcutaneous Tissue) Exposed: Yes Necrotic Quality: Adherent Slough Tendon Exposed: No Muscle Exposed: No Joint Exposed: No Bone Exposed: No Treatment Notes Wound #15 (Sacrum) Cleanser Wound Cleanser Discharge Instruction: Cleanse the wound with wound cleanser prior to applying a clean dressing using gauze sponges, not tissue or cotton balls. Peri-Wound Care Skin Prep Discharge Instruction: Use skin prep as directed Topical Primary Dressing Iodosorb Gel 10 (gm) Tube Discharge Instruction: Apply to wound bed as instructed Secondary Dressing ComfortFoam Border, 3x3 in (silicone border) Discharge Instruction: Apply over primary dressing as directed. Secured With Compression Wrap Compression Stockings Environmental education officer) Signed: 03/30/2020 5:23:06 PM By: Rhae Hammock RN Signed: 03/31/2020 7:45:09 AM By: [name] Entered By: [name] on 03/30/2020 16:24:54 -------------------------------------------------------------------------------- Vitals Details Patient Name: Date of Service: DO RN, [name] 03/30/2020 3:00 PM Medical Record Number: [id number] Patient Account Number: [id number] Date of Birth/Sex: Treating RN: November 06, 1929 (87 y.o. [name], [name] Primary Care Becki Mccaskill: [name] Other Clinician: Referring Spiros Greenfeld: Treating Keala Drum/Extender: [name] in Treatment: 20 Vital Signs Time Taken: 15:27 Temperature (F): 98.0 Height (in): 69 Pulse (bpm): 83 Respiratory Rate (breaths/min): 20 Blood Pressure (mmHg): 151/69 Reference Range: 80 - 120 mg /  dl Electronic Signature(s) Signed: 03/31/2020 7:45:09 AM By: [name] Entered By: [name] on 03/30/2020 15:27:26

## 2020-04-01 ENCOUNTER — Telehealth: Payer: Self-pay | Admitting: *Deleted

## 2020-04-01 DIAGNOSIS — I110 Hypertensive heart disease with heart failure: Secondary | ICD-10-CM | POA: Diagnosis not present

## 2020-04-01 DIAGNOSIS — I739 Peripheral vascular disease, unspecified: Secondary | ICD-10-CM | POA: Diagnosis not present

## 2020-04-01 DIAGNOSIS — Z4781 Encounter for orthopedic aftercare following surgical amputation: Secondary | ICD-10-CM | POA: Diagnosis not present

## 2020-04-01 DIAGNOSIS — J9611 Chronic respiratory failure with hypoxia: Secondary | ICD-10-CM | POA: Diagnosis not present

## 2020-04-01 DIAGNOSIS — I5032 Chronic diastolic (congestive) heart failure: Secondary | ICD-10-CM | POA: Diagnosis not present

## 2020-04-01 DIAGNOSIS — L89152 Pressure ulcer of sacral region, stage 2: Secondary | ICD-10-CM | POA: Diagnosis not present

## 2020-04-01 NOTE — Telephone Encounter (Signed)
Called and spoke with patient's wife, [name], to schedule a palliative care home visit. Visit scheduled for 3/28@130p .

## 2020-04-02 ENCOUNTER — Other Ambulatory Visit (HOSPITAL_COMMUNITY)
Admission: RE | Admit: 2020-04-02 | Discharge: 2020-04-02 | Disposition: A | Discharge status: Home / Self Care | Payer: Medicare Other | Source: Ambulatory Visit | Attending: Urology | Admitting: Urology

## 2020-04-02 ENCOUNTER — Other Ambulatory Visit: Payer: Self-pay

## 2020-04-02 ENCOUNTER — Encounter (HOSPITAL_COMMUNITY)
Admission: RE | Admit: 2020-04-02 | Discharge: 2020-04-02 | Disposition: A | Discharge status: Home / Self Care | Payer: Medicare Other | Source: Ambulatory Visit | Attending: Urology | Admitting: Urology

## 2020-04-02 DIAGNOSIS — Z20822 Contact with and (suspected) exposure to covid-19: Secondary | ICD-10-CM | POA: Diagnosis not present

## 2020-04-02 DIAGNOSIS — Z01812 Encounter for preprocedural laboratory examination: Secondary | ICD-10-CM | POA: Insufficient documentation

## 2020-04-02 LAB — CBC
HCT: 32.1 % — ABNORMAL LOW (ref 39.0–52.0)
Hemoglobin: 10.1 g/dL — ABNORMAL LOW (ref 13.0–17.0)
MCH: 30.9 pg (ref 26.0–34.0)
MCHC: 31.5 g/dL (ref 30.0–36.0)
MCV: 98.2 fL (ref 80.0–100.0)
Platelets: 240 10*3/uL (ref 150–400)
RBC: 3.27 MIL/uL — ABNORMAL LOW (ref 4.22–5.81)
RDW: 12.9 % (ref 11.5–15.5)
WBC: 9.2 10*3/uL (ref 4.0–10.5)
nRBC: 0 % (ref 0.0–0.2)

## 2020-04-02 LAB — BASIC METABOLIC PANEL
Anion gap: 9 (ref 5–15)
BUN: 19 mg/dL (ref 8–23)
CO2: 32 mmol/L (ref 22–32)
Calcium: 9.1 mg/dL (ref 8.9–10.3)
Chloride: 92 mmol/L — ABNORMAL LOW (ref 98–111)
Creatinine, Ser: 0.98 mg/dL (ref 0.61–1.24)
GFR, Estimated: >60 mL/min (ref >60)
Glucose, Bld: 122 mg/dL — ABNORMAL HIGH (ref 70–99)
Potassium: 3 mmol/L — ABNORMAL LOW (ref 3.5–5.1)
Sodium: 133 mmol/L — ABNORMAL LOW (ref 135–145)

## 2020-04-02 LAB — SARS CORONAVIRUS 2 (TAT 6-24 HRS): SARS Coronavirus 2: NEGATIVE

## 2020-04-03 DIAGNOSIS — D649 Anemia, unspecified: Secondary | ICD-10-CM | POA: Diagnosis not present

## 2020-04-03 DIAGNOSIS — R339 Retention of urine, unspecified: Secondary | ICD-10-CM | POA: Diagnosis not present

## 2020-04-03 DIAGNOSIS — Z9981 Dependence on supplemental oxygen: Secondary | ICD-10-CM | POA: Diagnosis not present

## 2020-04-03 DIAGNOSIS — N139 Obstructive and reflux uropathy, unspecified: Secondary | ICD-10-CM | POA: Diagnosis not present

## 2020-04-03 DIAGNOSIS — I110 Hypertensive heart disease with heart failure: Secondary | ICD-10-CM | POA: Diagnosis not present

## 2020-04-03 DIAGNOSIS — E871 Hypo-osmolality and hyponatremia: Secondary | ICD-10-CM | POA: Diagnosis not present

## 2020-04-03 DIAGNOSIS — J449 Chronic obstructive pulmonary disease, unspecified: Secondary | ICD-10-CM | POA: Diagnosis not present

## 2020-04-03 DIAGNOSIS — R262 Difficulty in walking, not elsewhere classified: Secondary | ICD-10-CM | POA: Diagnosis not present

## 2020-04-03 DIAGNOSIS — Z4781 Encounter for orthopedic aftercare following surgical amputation: Secondary | ICD-10-CM | POA: Diagnosis not present

## 2020-04-03 DIAGNOSIS — I739 Peripheral vascular disease, unspecified: Secondary | ICD-10-CM | POA: Diagnosis not present

## 2020-04-03 DIAGNOSIS — L89152 Pressure ulcer of sacral region, stage 2: Secondary | ICD-10-CM | POA: Diagnosis not present

## 2020-04-03 DIAGNOSIS — Z89611 Acquired absence of right leg above knee: Secondary | ICD-10-CM | POA: Diagnosis not present

## 2020-04-03 DIAGNOSIS — I480 Paroxysmal atrial fibrillation: Secondary | ICD-10-CM | POA: Diagnosis not present

## 2020-04-03 DIAGNOSIS — I5032 Chronic diastolic (congestive) heart failure: Secondary | ICD-10-CM | POA: Diagnosis not present

## 2020-04-03 DIAGNOSIS — Z466 Encounter for fitting and adjustment of urinary device: Secondary | ICD-10-CM | POA: Diagnosis not present

## 2020-04-03 DIAGNOSIS — R531 Weakness: Secondary | ICD-10-CM | POA: Diagnosis not present

## 2020-04-03 DIAGNOSIS — I2510 Atherosclerotic heart disease of native coronary artery without angina pectoris: Secondary | ICD-10-CM | POA: Diagnosis not present

## 2020-04-03 DIAGNOSIS — I87322 Chronic venous hypertension (idiopathic) with inflammation of left lower extremity: Secondary | ICD-10-CM | POA: Diagnosis not present

## 2020-04-03 DIAGNOSIS — J9611 Chronic respiratory failure with hypoxia: Secondary | ICD-10-CM | POA: Diagnosis not present

## 2020-04-05 ENCOUNTER — Telehealth: Payer: Self-pay

## 2020-04-05 ENCOUNTER — Other Ambulatory Visit: Payer: Medicare Other | Admitting: *Deleted

## 2020-04-05 ENCOUNTER — Other Ambulatory Visit: Payer: Self-pay

## 2020-04-05 VITALS — BP 123/66 | HR 78 | Temp 97.7°F | Resp 20

## 2020-04-05 DIAGNOSIS — Z515 Encounter for palliative care: Secondary | ICD-10-CM

## 2020-04-05 MED ORDER — CEFAZOLIN SODIUM-DEXTROSE 2-4 GM/100ML-% IV SOLN: 2.0000 g | INTRAVENOUS | Status: DC

## 2020-04-05 NOTE — Progress Notes (Signed)
COMMUNITY PALLIATIVE CARE RN NOTE  PATIENT NAME: [name] DOB: 05-Aug-1929 MRN: [id number]  PRIMARY CARE PROVIDER: [name], MD  RESPONSIBLE PARTY: [name] (wife) Acct ID - Guarantor Home Phone Work Phone Relationship Acct Type  [id number] [name], [name] [phone]  Self P/F     [address], [place], [place] [address]   Covid-19 Pre-screening Negative  PLAN OF CARE and INTERVENTION:  1. ADVANCE CARE PLANNING/GOALS OF CARE: Goal is for patient to remain at home with his wife. He wants to get stronger. 2. PATIENT/CAREGIVER EDUCATION: Symptom management, safe transfers, s/s of infection 3. DISEASE STATUS: Met with patient and his wife in their home. Upon arrival, patient is sitting in the kitchen eating some lunch. Wife transported him to the living room via wheelchair. He remains alert and oriented x 4. He is very hard of hearing. He denies pain at this time, but says he experiences bladder spasms from his foley catheter often. He continues with ongoing chronic sinus issues. He still uses nasal saline rinses and Flonase daily which he says helps very little. He continues on oxygen at 2L/min via  and Trilogy machine at night. He says he is noticing his breathing slowly getting worse. He is using his inhalers. I recommend that he remember to use his nebulizers if the inhalers do not seem to be effective. He is taking Xanax 0.5 mg by mouth 3-4 times per day. This does help with his anxiety. He has recently started on Zoloft. He has been taking this for the past 10 days. He knows it takes 2 weeks or more before it starts working. He speaks of a growth on the side of his bladder. He is to have a endoscopic procedure tomorrow to have this growth removed. He is concerned that this is an outpatient procedure and he will be discharged home same day. He worries that if there is a complication when he returns home, what he is to do as he doesn't want to go to the ER. Assured him that  he will be monitored after the procedure before being sent home. Wife says that it has been recommended that patient go under hospice care. He had a hospice evaluation on 03/05/20. He meets eligibility requirements, but declines services since the Trilogy machine will not be covered. He does not want to give up this machine or use any alternatives as he feels this really helps him at night. He requires assistance with bathing and dressing. He has a right above the knee amputation. He is able to transfer on his left leg. He works with physical therapy through [facility] 1-2x/week. He also has a Therapist, sports that visits weekly. He says he is performing his leg exercises, and on days he feels better he will stand at the sink for 10-15 seconds. He is having more difficulty getting in and out of the car.  He is having issues with constipation. Last bowel movement was on 04/03/20. He is taking Miralax to help. He does have some pitting edema to his right leg. He also has some redness noted to his shin region and closer to his ankle. Area is not warm to touch. He says he had an appointment with a Dermatologist who gave them an antifungal medication but has not noticed any changes. The area does not itch and is not raised. He is appreciative of visit. Will continue to monitor.  HISTORY OF PRESENT ILLNESS: This is a 87 yo male with a h/o CHF, PVD, CAD COPD,  NSTEMI and R BKA. Palliative care team continues to follow patient and visits monthly and PRN   CODE STATUS:   Code Status: Prior  ADVANCED DIRECTIVES: N MOST FORM: no PPS: 40%   PHYSICAL EXAM:   VITALS: Today's Vitals   04/05/20 1409  BP: 123/66  Pulse: 78  Resp: 20  Temp: 97.7 F (36.5 C)  TempSrc: Temporal  SpO2: 98%  PainSc: 0-No pain    LUNGS: clear to auscultation  CARDIAC: Cor RRR EXTREMITIES: pitting edema noted to left lower extremity (compression stocking on and taking Furosemide 40 mg BID) SKIN: Stage III sacral wound   NEURO: Alert and  oriented x 4, HOH, increased generalized weakness, transfers w/1 person assistance   (Duration of visit and documentation 60 minutes)   [name], RN BSN

## 2020-04-05 NOTE — Telephone Encounter (Signed)
[name] called: [name] will not be in anytime soon. Because he is having surgery.   His wife is requesting a script for a shoe. For his non-surgical foot. ( No other details given).   Please advise.

## 2020-04-05 NOTE — Telephone Encounter (Signed)
I have notified Mrs Riffe and she will make an appointment.

## 2020-04-05 NOTE — Anesthesia Preprocedure Evaluation (Addendum)
Anesthesia Evaluation  Patient identified by MRN, date of birth, ID band Patient awake    Reviewed: Allergy & Precautions, H&P , NPO status , Patient's Chart, lab work & pertinent test results, reviewed documented beta blocker date and time   Airway Mallampati: III  TM Distance: >3 FB Neck ROM: Full    Dental no notable dental hx. (+) Teeth Intact, Dental Advisory Given   Pulmonary asthma , COPD,  COPD inhaler and oxygen dependent, former smoker,    Pulmonary exam normal breath sounds clear to auscultation       Cardiovascular hypertension, Pt. on medications and Pt. on home beta blockers + CAD, + Past MI, + CABG, + Peripheral Vascular Disease, +CHF and + DOE  + dysrhythmias Atrial Fibrillation  Rhythm:Regular Rate:Normal     Neuro/Psych  Headaches, Anxiety Depression    GI/Hepatic Neg liver ROS, GERD  Medicated,  Endo/Other  Hypothyroidism   Renal/GU negative Renal ROS  negative genitourinary   Musculoskeletal  (+) Arthritis , Osteoarthritis,    Abdominal   Peds  Hematology  (+) Blood dyscrasia, anemia ,   Anesthesia Other Findings   Reproductive/Obstetrics negative OB ROS                           Anesthesia Physical Anesthesia Plan  ASA: IV  Anesthesia Plan: Spinal   Post-op Pain Management:    Induction: Intravenous  PONV Risk Score and Plan: 2 and Propofol infusion and Ondansetron  Airway Management Planned: Simple Face Mask  Additional Equipment:   Intra-op Plan:   Post-operative Plan:   Informed Consent: I have reviewed the patients History and Physical, chart, labs and discussed the procedure including the risks, benefits and alternatives for the proposed anesthesia with the patient or authorized representative who has indicated his/her understanding and acceptance.     Dental advisory given  Plan Discussed with: CRNA  Anesthesia Plan Comments: (See PAT note  03/31/20, Konrad Felix, PA-C)       Anesthesia Quick Evaluation

## 2020-04-05 NOTE — H&P (Signed)
Office Visit Report     03/12/2020   --------------------------------------------------------------------------------   [name]  MRN: [id number]  DOB: 01-27-1929, 87 year old Male  SSN: [id number]   PRIMARY CARE:  C Melinda Crutch, MD  REFERRING:  Daine Gravel, NP  PROVIDER:  Festus Aloe, M.D.  LOCATION:  [facility] [phone]     --------------------------------------------------------------------------------   CC/HPI: F/u -   1) BPH - since May 2002, November 2014, PSA 22.79, Prostate biopsy is BENIGN, 120 g prostate. Apr 2015 - started finasteride; right prostate nodule noted.   Stopped tamsulosin due to dizziness. On finasteride monotherapy. His LUTs worsened and he restarted tamsulosin Jun 2018, but stopped it again (blood pressure "erratic"). F/u symptoms improved and PVR 53 ml. AUASS = 12.   He had increase in LUTS. PVR was 135 ml. He again had increase LUTS 06/21 with pvr 84 ml. UA with no infection but 20-40 rbc. [facility] 06/27/2019 for right foot wound. Wound had been debrided 06/23/2019. He had PVD and not a candidate for revascularization and they wanted to do rt AKA but he declined and went home. Cr 9.93, renal US showed partially distended bladder, no hydro, 11 mm RLP stone and 98 g prostate.   More LUTS and straining, dribbling, urge incontinence. PVR 544 ml. He is not ambulating but getting around and sitting. He took tams while in hospital. On rapaflo usually. Foley placed. He failed a VT. He removed foley June 2021 at home and came to office - PVR 441 ml. He left and returned and it was taxing on him to leave the office and return and foley was replaced. Difficult foley change with HH but 16 Fr coude easy here.   He had a right AKA. He is actually doing much better. The right foot was painful and making him sick. Now stronger and transferring well. He is eager for another VT. Off silodosin. On finasteride. Failed VT. Needed stump revision.    2) gross hematuria - He had a cath change and some gross hematuria develop 02/22. Red/dark blood in bag without clots. Cysto with papillary tumor at [facility] Could be causing some obstruction.   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 I had a long discussion with pt son. Pt transferred care to Dr. 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 I discussed again with pt and son. I suggested a f/u CT. Pt transferred care to Dr. Hulen Shouts who sought the consult of Dr. Sherol Dade who favored another attempt RG but admitted there wouldn't be much in the way of actually treating a tumor. He underwent a CT at [facility] 08/17 that showed 2.5 cm enhancing soft tissue mass of left distal ureter which is an increase from about 1.5 cm on 06/16/2015 CT. Again, I discussed with findings and favored URS to eval the tumor. He did not F/u with [facility].   May 2018 CT stable left distal mass. [place] Nov 2018 normal. Kidneys looked normal and no hydro. PVR normal. Chest CT Feb 2019 showed mild left > right hydronephrosis. He's been weak and in the hospital. He had the flu. His [name]19 bun was 14, cr 1.01. UA clear.   He underwent CT scan Nov 2019 and we reviewed the scans. ics. He voids with a good flow. no hematuria or flank pain. [place] 07/21 no hydro.   4) RLP stone - 11-15 mm RLP stone noted  on imaging.   Seen for the above.     ALLERGIES: Adhesive Tape Amoxicillin cefdinir cipro Doxycycline Monohydrate TABS levofloxacin nitrofurantoin - swollen hands Omnicef CAPS sertraline Sulfa Drugs    MEDICATIONS: Finasteride 5 mg tablet 1 tablet PO Daily No more refills until follow up  Levothyroxine Sodium 25 mcg tablet  Metoprolol Succinate 25 mg tablet, extended release 24 hr  Myrbetriq 50 mg tablet, extended release 24 hr 1 tablet PO Daily PRN  Alprazolam 0.25 mg tablet  Aspir 81  Atorvastatin Calcium 20 mg tablet  Azelastine Hcl 137 mcg  (0.1 %) aerosol, spray with pump  Azithromycin 250 mg tablet  Desitin 13 % cream  Diclofenac Sodium 1 % gel  Eucerin  Famotidine 20 mg tablet  Fluticasone Propionate 50 MCG/ACT Nasal Suspension Nasal  Furosemide 80 mg tablet  Gabapentin 100 mg capsule  Gabapentin  Klor-Con M20 20 meq tablet, ext release, particles/crystals  Melatonin 3 mg tablet  Miralax 17 gram powder in packet  Muro-128 5 % drops  Nitrostat 0.4 mg tablet, sublingual Sublingual  Oxygen Use  Pantoprazole Sodium 20 mg tablet, delayed release  Prednisone 10 mg tablet  Preservision Areds  Spiriva Respimat 2.5 mcg/actuation mist inhaler Inhalation  Spironolactone  Symbicort 160 mcg-4.5 mcg/actuation hfa aerosol with adapter  Systane 0.3 %-0.4 % drops  Tums  Vitamin B12  Vitamin D3  Xopenex Hfa 45 mcg/actuation hfa aerosol with adapter     GU PSH: ESWL - 2008 Insert Bladder Cath; Complex - 02/26/2020 Locm 300-399Mg /Ml Iodine,1Ml - 2018, 2017 Prostate Needle Biopsy - 2008       Gray Notes: Biopsy Of The Prostate Needle, Lithotripsy, Neurological Surgery Carotid Endarterectomy, Coronary Artery Bypass Graft (CABG)   NON-GU PSH: Amputate Leg At Thigh, Right CABG (coronary artery bypass grafting) - 2008 Carotid Endarterectomy - 2008     GU PMH: Incomplete bladder emptying - 02/26/2020, - 01/16/2020, He will remove foley at home one morning and see NP around 1 pm for a void trial in 3 weeks. He is still doing PT. , - 11/06/2019, - 10/10/2019, - 07/03/2019 Obstructive and reflux uropathy, Unspec - 01/16/2020, Obstructive uropathy, - 2015 Urinary Retention - 12/12/2019, - 12/02/2019, - 09/17/2019, The catheter was replaced today without difficulty. He will return monthly for foley exchange. , - 09/05/2019 BPH w/LUTS - 12/02/2019, Cont finasteride. Restart silodosin 3-4 days prior to a void trial. , - 11/06/2019, - 07/30/2019, - 07/10/2019, on finasteride and rapaflo 4mg . Disc with pt and wife the nature r/b of foley catheter but he  does not want it. He did not have hydro or Cr increase, so that's reasonable. Wants to try silodosin 8 mg. He's not ambulating and has care at home. He has home health and palliative care helping and they could attempt foley --- his prostate is 98 g and might need a coude. , - 07/03/2019, - 04/24/2018, - 10/24/2017, - 2019, - 2018, - 2018, - 2018, - 2018, Benign localized prostatic hyperplasia with lower urinary tract symptoms (LUTS), - 2017, Benign nodular prostatic hyperplasia with lower urinary tract symptoms, - 2015 Ureteral Cancer, Unspec, no hydro or gross hematuria. - 07/03/2019, - 04/24/2018, - 10/24/2017, - 2019, - 2018, - 2018, - 2017 Urinary Frequency (Stable) - 07/03/2019, - 2018 Weak Urinary Stream (Stable) - 10/24/2017, - 09/21/2017 (Stable), - 2018, - 2018 Urinary Urgency - 2018, Urinary urgency, - 2016 Gross hematuria - 2017, - 2017, Gross hematuria, - 2017 Elevated PSA - 2017, Elevated prostate specific antigen (PSA), - 2017 Renal calculus -  2017, Nephrolithiasis, - 2014 Pelvic/perineal pain, Pelvic pain in male - 2015 Inflammatory Disease Prostate, Unspec, Prostatitis - 2015 Dysuria, Dysuria - 2015 Lower abdominal pain, unspecified, Groin discomfort - 2014 BPH w/o LUTS, Benign prostatic hypertrophy without lower urinary tract symptoms - 2014 History of urolithiasis, Nephrolithiasis - 2014 Other microscopic hematuria, Microscopic Hematuria - 2014 Prostate nodule w/o LUTS, Nodular prostate without lower urinary tract symptoms - 2014 Prostate, Neoplasm of uncertain behavior, Neoplasm of uncertain behavior of prostate - 2014 Spermatocele of epididymis, Unspec, Spermatocele - 2014      PMH Notes:  2006-04-05 14:18:27 - Note: Coronary Artery Disease  2006-04-05 14:18:27 - Note: Peptic Ulcer  2006-04-05 14:18:27 - Note: Arthritis   NON-GU PMH: Encounter for general adult medical examination without abnormal findings, Encounter for preventive health examination - 2015 COPD, Chronic  Obstructive Pulmonary Disease - 2014 Personal history of other diseases of the circulatory system, History of hypertension - 2014 Personal history of other diseases of the digestive system, History of esophageal reflux - 2014 Personal history of other endocrine, nutritional and metabolic disease, History of hypercholesterolemia - 2014 Vitamin D deficiency, unspecified, Vitamin D Deficiency - 2014    FAMILY HISTORY: Family Health Status Number - Runs In Family No pertinent family history - Other   SOCIAL HISTORY: Marital Status: Married Preferred Language: English; Ethnicity: Not Hispanic Or Latino; Race: White Current Smoking Status: Patient does not smoke anymore.     REVIEW OF SYSTEMS:    GU Review Male:   gross hematuria. Patient denies frequent urination, hard to postpone urination, burning/ pain with urination, get up at night to urinate, leakage of urine, stream starts and stops, trouble starting your stream, have to strain to urinate , erection problems, and penile pain.  Gastrointestinal (Upper):   Patient denies nausea, vomiting, and indigestion/ heartburn.  Gastrointestinal (Lower):   Patient denies diarrhea and constipation.  Constitutional:   Patient denies fever, night sweats, weight loss, and fatigue.  Skin:   Patient denies skin rash/ lesion and itching.  Eyes:   Patient denies blurred vision and double vision.  Ears/ Nose/ Throat:   Patient denies sore throat and sinus problems.  Hematologic/Lymphatic:   Patient denies swollen glands and easy bruising.  Cardiovascular:   Patient denies leg swelling and chest pains.  Respiratory:   Patient denies cough and shortness of breath.  Endocrine:   Patient denies excessive thirst.  Musculoskeletal:   Patient denies back pain and joint pain.  Neurological:   Patient denies headaches and dizziness.  Psychologic:   Patient denies depression and anxiety.   VITAL SIGNS:      03/12/2020 02:24 PM  BP 142/78 mmHg  Pulse 58 /min   Temperature 97.8 F / 36.5 C   GU PHYSICAL EXAMINATION:    Scrotum: No lesions. No edema. No cysts. No warts.  Urethral Meatus: Normal size. No lesion, no wart, no discharge, no polyp. Normal location.  Penis: Circumcised, no warts, no cracks. No dorsal Peyronie's plaques, no left corporal Peyronie's plaques, no right corporal Peyronie's plaques, no scarring, no warts. No balanitis, no meatal stenosis.   MULTI-SYSTEM PHYSICAL EXAMINATION:    Constitutional: Well-nourished. No physical deformities. Normally developed. Good grooming.  Neck: Neck symmetrical, not swollen. Normal tracheal position.  Respiratory: No labored breathing, no use of accessory muscles.   Cardiovascular: Normal temperature, normal extremity pulses, no swelling, no varicosities.  Skin: No paleness, no jaundice, no cyanosis. No lesion, no ulcer, no rash.  Neurologic / Psychiatric: Oriented to time, oriented to  place, oriented to person. No depression, no anxiety, no agitation.  Gastrointestinal: No mass, no tenderness, no rigidity, non obese abdomen.     Complexity of Data:  X-Ray Review: Renal Ultrasound: Reviewed Films. 2021 C.T. Abdomen/Pelvis: Reviewed Films. 2019     06/10/15 05/07/14 11/04/13 07/08/13 06/04/13 10/01/12 10/11/11 04/18/11  PSA  Total PSA 3.82 ng/dl 4.05  5.03  6.18  14.03  22.79  7.41  6.41   Free PSA  0.71      1.78  1.93   % Free PSA  18      24  30      08/16/04 08/07/03  Hormones  Testosterone, Total 4.19  2.49     PROCEDURES:         Flexible Cystoscopy - 52000  Risks, benefits, and some of the potential complications of the procedure were discussed with the patient. All questions were answered. Informed consent was obtained. Antibiotic prophylaxis was given -- Cephalexin. Sterile technique and intraurethral analgesia were used.  Meatus:  Normal size. Normal location. Normal condition.  Urethra:  No strictures.  External Sphincter:  Normal.  Verumontanum:  Normal.  Prostate:   Non-obstructing. No hyperplasia.  Bladder Neck:  Non-obstructing.  Ureteral Orifices:  Normal location. Normal size. Normal shape. Effluxed clear urine.  Bladder:  No trabeculation. A papillary BN tumor. Normal mucosa. No stones.      The lower urinary tract was carefully examined. The procedure was well-tolerated and without complications. Antibiotic instructions were given. Instructions were given to call the office immediately for bloody urine, difficulty urinating, painful urination, fever, chills, nausea, vomiting or other illness. The patient stated that he understood these instructions and would comply with them.        Bladder Irrigation - 51700  The patient's indwelling foley was removed. A 20 French Foley catheter was inserted into the bladder using sterile technique. The patient was taught routine catheter care. Hand irrigation of the bladder with sterile saline was performed.   ASSESSMENT:      ICD-10 Details  1 GU:   BPH w/LUTS - N40.1 Chronic, Stable  2   Incomplete bladder emptying - R39.14 Chronic, Stable  3   Gross hematuria - R31.0 Chronic, Stable - check CT -   4   Bladder tumor/neoplasm - D41.4 Chronic, Stable - Disc with pt and wife the nature r/b/a to TURBT with post-op gemcitabine. He could monitor but this tumor is bleeding on and off. All questions answered and he will proceed to OR.    PLAN:           Orders X-Rays: C.T. Hematuria With and Without I.V. Contrast - 02/19/2020 BUN 18, Cr 1.02           Schedule Return Visit/Planned Activity: ASAP - Schedule Surgery          Document Letter(s):  Created for Patient: Clinical Summary         Notes:   cc: Dr. Harrington Challenger     * Signed by Festus Aloe, M.D. on 03/14/20 at 9:51 PM (EST)*     The information contained in this medical record document is considered private and confidential patient information. This information can only be used for the medical diagnosis and/or medical services that are being provided by  the patient's selected caregivers. This information can only be distributed outside of the patient's care if the patient agrees and signs waivers of authorization for this information to be sent to an outside source or route.

## 2020-04-05 NOTE — Telephone Encounter (Signed)
I would need to know more information and cannot order a new one without seeing him.  He can follow up with PCP if she needs the shoe sooner.  Thanks.

## 2020-04-05 NOTE — Progress Notes (Signed)
Anesthesia Chart Review   Case: [id number] Date/Time: 04/06/20 1315   Procedure: TRANSURETHRAL RESECTION OF BLADDER TUMOR (TURBT)/ POST OPERATIVE INSTILLATION OF GEMCITABINE (N/A )   Anesthesia type: Choice   Pre-op diagnosis: BLADDER NEOPLASM, GROSS HEMATURIA   Location: [facility] ROOM 05 / [facility]   Surgeons: Travis Aloe, MD      DISCUSSION:87 y.o. former smoker with h/o HTN, COPD (uses Trilogy ventilator at night, 2L O2 during the day), asthma, PVD, CAD (s/p CABG 2003), new onset Afib/flutter following AKA revision 01/27/2020 (not anticoagulated due to other comorbidities), bladder neoplasm, gross hematuria scheduled for above procedure 04/06/2020 with Dr. Festus [name].   S/p right knee above knee amputation revision 01/27/20.  No anesthesia complications noted.   Per cardiology preoperative evaluation 03/17/2020, "Chart reviewed as part of pre-operative protocol coverage. Patient was contacted 03/17/2020 in reference to pre-operative risk assessment for pending surgery as outlined below.  [name] was last seen on 03/01/20 by Travis Merle NP.    Pt had AKA revision on 01/27/20.  He has CAD s/p CABG 2003, PAD, chronic respiratory failure, chronic HFpEF, abnormal nuclear stress test in 2019, and new onset Afib/flutter following AKA revision. He is not anticoagulated given co-morbid conditions. He has been recommended for hospice, but turned down by [facility] due to type of BiPAP he uses at night. Hospice is currently NOT in place. Overall, he is high risk for any procedure. It is encouraging that he did not have a major cardiac complication with his recent AKA revision, with the exception of new Afib. He did have one episode of rapid heart rate in the 130s yesterday that lasted about 4-5 hours, but reports tolerating this well.   I discussed his risk with his wife [name] and they understand and accept the risk of surgery.   We have also been asked for clearance to hold ASA. Ideally, we  would prefer to continue ASA throughout the perioperative period given both CAD and PAF. However, if doing so would significantly increase morbidity or mortality, please hold 5-7 days.  *I will reach out to Dr. Tamala Julian to see if he has additional recommendations.  Therefore, based on ACC/AHA guidelines, the patient would be at acceptable risk for the planned procedure without further cardiovascular testing."  Anticipate pt can proceed with planned procedure barring acute status change and after evaluation DOS by anesthesia.  VS: Ht 5\' 8"  (1.727 m)   Wt 59 kg   BMI 19.77 kg/m   PROVIDERS: Travis Cruel, MD is PCP   Travis Schick, MD is Cardiologist  Travis Gully, MD is Pulmonologist    LABS: Labs reviewed: Acceptable for surgery. (all labs ordered are listed, but only abnormal results are displayed)  Labs Reviewed - No data to display   IMAGES:   EKG: 02/13/2020 Rate 77 bpm  Sinus rhythm with Premature atrial complexes Otherwise normal ECG Since last tracing Rhythm has changed to normal sinus rhythm  CV: Echo 01/28/2020 IMPRESSIONS    1. Left ventricular ejection fraction, by estimation, is 60 to 65%. The  left ventricle has normal function. The left ventricle has no regional  wall motion abnormalities. There is mild concentric left ventricular  hypertrophy. Left ventricular diastolic  parameters are indeterminate.  2. Right ventricular systolic function is normal. The right ventricular  size is normal.  3. The mitral valve is normal in structure. No evidence of mitral valve  regurgitation. No evidence of mitral stenosis.  4. The aortic valve is tricuspid. There  is mild calcification of the  aortic valve. There is mild thickening of the aortic valve. Aortic valve  regurgitation is not visualized. Mild aortic valve sclerosis is present,  with no evidence of aortic valve  stenosis.  5. The inferior vena cava is normal in size with greater than 50%   respiratory variability, suggesting right atrial pressure of 3 mmHg.  Past Medical History:  Diagnosis Date  . Anxiety   . Arthritis    "generalized" (04/04/2017)  . Asthma   . Basal cell carcinoma    "left ear"  . BPH (benign prostatic hyperplasia)    severe; s/p multiple biopsies  . CAD (coronary artery disease)   . Carotid artery occlusion   . Chronic respiratory failure ([place])   . Chronic rhinitis   . COPD (chronic obstructive pulmonary disease) (H[facility])    2L  O2  . Depression   . Diastolic heart failure ([place]) 2019  . Dilation of biliary tract   . Elevated troponin 03/09/2017  . Gallstones   . GERD (gastroesophageal reflux disease)   . History of blood transfusion    "w/his CABG" (04/04/2017)  . History of kidney stones   . Hyperlipidemia   . Hypertension   . On home oxygen therapy    "~ 24/7" (04/04/2017)  . Peripheral vascular disease ([place])   . Pneumonia 2019  . Squamous cell carcinoma of skin 04/23/2013   in situ-Right flank (txpbx)  . Squamous cell carcinoma of skin 01/23/2017   [place]/ in siut- Left ear rim (CX3FU)  . Syncope and collapse   . Ureteral tumor 08/2015   had endoscopic procedure for evaluation, unable to reach for biopsy  . Urinary catheter (Foley) change required     Past Surgical History:  Procedure Laterality Date  . ABDOMINAL AORTOGRAM W/LOWER EXTREMITY Bilateral 06/30/2019   Procedure: ABDOMINAL AORTOGRAM W/LOWER EXTREMITY;  Surgeon: Waynetta Sandy, MD;  Location: [facility];  Service: Cardiovascular;  Laterality: Bilateral;  . AMPUTATION Right 08/11/2019   Procedure: RIGHT ABOVE KNEE AMPUTATION;  Surgeon: Angelia Mould, MD;  Location: [facility];  Service: Vascular;  Laterality: Right;  . AMPUTATION Right 01/27/2020   Procedure: RIGHT AMPUTATION ABOVE KNEE REVISION;  Surgeon: Waynetta Sandy, MD;  Location: [facility];  Service: Vascular;  Laterality: Right;  . BASAL CELL CARCINOMA EXCISION Left    ear  . CARDIAC  CATHETERIZATION     "prior to bypass"  . CATARACT EXTRACTION W/ INTRAOCULAR LENS  IMPLANT, BILATERAL Bilateral   . CORONARY ARTERY BYPASS GRAFT  10/2001   LIMA to LAD, SVG to OM1-2, SVG to RCA and PDA  . LITHOTRIPSY    . PROSTATE BIOPSY  10/2012   had bleeding after surgery.  . TONSILLECTOMY      MEDICATIONS: . acetaminophen (TYLENOL) 500 MG tablet  . ALPRAZolam (XANAX) 0.25 MG tablet  . ALPRAZolam (XANAX) 0.5 MG tablet  . aspirin 81 MG tablet  . atorvastatin (LIPITOR) 40 MG tablet  . calcium carbonate (TUMS - DOSED IN MG ELEMENTAL CALCIUM) 500 MG chewable tablet  . Cholecalciferol (VITAMIN D3) 50 MCG (2000 UT) TABS  . famotidine (PEPCID) 20 MG tablet  . finasteride (PROSCAR) 5 MG tablet  . fluticasone (FLONASE) 50 MCG/ACT nasal spray  . furosemide (LASIX) 40 MG tablet  . guaiFENesin (MUCINEX) 600 MG 12 hr tablet  . HYDROcodone-acetaminophen (NORCO/VICODIN) 5-325 MG tablet  . levalbuterol (XOPENEX HFA) 45 MCG/ACT inhaler  . levalbuterol (XOPENEX) 0.63 MG/3ML nebulizer solution  . metoprolol succinate (TOPROL-XL)  25 MG 24 hr tablet  . mirabegron ER (MYRBETRIQ) 25 MG TB24 tablet  . Multiple Vitamins-Minerals (PRESERVISION AREDS 2) CAPS  . NITROSTAT 0.4 MG SL tablet  . OXYGEN  . pantoprazole (PROTONIX) 40 MG tablet  . Polyethyl Glycol-Propyl Glycol (SYSTANE) 0.4-0.3 % SOLN  . polyethylene glycol (MIRALAX / GLYCOLAX) 17 g packet  . predniSONE (DELTASONE) 10 MG tablet  . sertraline (ZOLOFT) 25 MG tablet  . sodium chloride (MURO 128) 5 % ophthalmic solution  . SYMBICORT 160-4.5 MCG/ACT inhaler  . Tiotropium Bromide Monohydrate (SPIRIVA RESPIMAT) 2.5 MCG/ACT AERS  . vitamin B-12 (CYANOCOBALAMIN) 1000 MCG tablet   No current facility-administered medications for this encounter.     Konrad Felix, PA-C [facility] [phone]

## 2020-04-06 ENCOUNTER — Telehealth: Payer: Self-pay

## 2020-04-06 ENCOUNTER — Encounter (HOSPITAL_COMMUNITY): Payer: Self-pay | Admitting: Urology

## 2020-04-06 ENCOUNTER — Ambulatory Visit (HOSPITAL_COMMUNITY): Payer: Medicare Other | Admitting: Physician Assistant

## 2020-04-06 ENCOUNTER — Ambulatory Visit (HOSPITAL_COMMUNITY)
Admission: RE | Admit: 2020-04-06 | Discharge: 2020-04-06 | Disposition: A | Discharge status: Home / Self Care | Payer: Medicare Other | Source: Other Acute Inpatient Hospital | Attending: Urology | Admitting: Urology

## 2020-04-06 ENCOUNTER — Encounter (HOSPITAL_COMMUNITY)
Admission: RE | Disposition: A | Discharge status: Home / Self Care | Payer: Self-pay | Source: Other Acute Inpatient Hospital | Attending: Urology

## 2020-04-06 ENCOUNTER — Other Ambulatory Visit: Payer: Self-pay

## 2020-04-06 ENCOUNTER — Ambulatory Visit (HOSPITAL_COMMUNITY): Payer: Medicare Other | Admitting: Certified Registered Nurse Anesthetist

## 2020-04-06 DIAGNOSIS — C679 Malignant neoplasm of bladder, unspecified: Secondary | ICD-10-CM | POA: Diagnosis not present

## 2020-04-06 DIAGNOSIS — N401 Enlarged prostate with lower urinary tract symptoms: Secondary | ICD-10-CM | POA: Insufficient documentation

## 2020-04-06 DIAGNOSIS — Z23 Encounter for immunization: Secondary | ICD-10-CM | POA: Diagnosis not present

## 2020-04-06 DIAGNOSIS — D414 Neoplasm of uncertain behavior of bladder: Secondary | ICD-10-CM

## 2020-04-06 DIAGNOSIS — Z8711 Personal history of peptic ulcer disease: Secondary | ICD-10-CM | POA: Insufficient documentation

## 2020-04-06 DIAGNOSIS — Z880 Allergy status to penicillin: Secondary | ICD-10-CM | POA: Insufficient documentation

## 2020-04-06 DIAGNOSIS — Z66 Do not resuscitate: Secondary | ICD-10-CM | POA: Diagnosis not present

## 2020-04-06 DIAGNOSIS — I2510 Atherosclerotic heart disease of native coronary artery without angina pectoris: Secondary | ICD-10-CM | POA: Insufficient documentation

## 2020-04-06 DIAGNOSIS — Z87891 Personal history of nicotine dependence: Secondary | ICD-10-CM | POA: Insufficient documentation

## 2020-04-06 DIAGNOSIS — I739 Peripheral vascular disease, unspecified: Secondary | ICD-10-CM | POA: Insufficient documentation

## 2020-04-06 DIAGNOSIS — Z951 Presence of aortocoronary bypass graft: Secondary | ICD-10-CM | POA: Insufficient documentation

## 2020-04-06 DIAGNOSIS — N400 Enlarged prostate without lower urinary tract symptoms: Secondary | ICD-10-CM | POA: Diagnosis not present

## 2020-04-06 DIAGNOSIS — M7989 Other specified soft tissue disorders: Secondary | ICD-10-CM | POA: Diagnosis not present

## 2020-04-06 DIAGNOSIS — Z888 Allergy status to other drugs, medicaments and biological substances status: Secondary | ICD-10-CM | POA: Insufficient documentation

## 2020-04-06 DIAGNOSIS — I10 Essential (primary) hypertension: Secondary | ICD-10-CM | POA: Diagnosis not present

## 2020-04-06 DIAGNOSIS — I959 Hypotension, unspecified: Secondary | ICD-10-CM | POA: Diagnosis not present

## 2020-04-06 DIAGNOSIS — Z882 Allergy status to sulfonamides status: Secondary | ICD-10-CM | POA: Insufficient documentation

## 2020-04-06 DIAGNOSIS — I4891 Unspecified atrial fibrillation: Secondary | ICD-10-CM | POA: Diagnosis not present

## 2020-04-06 DIAGNOSIS — I480 Paroxysmal atrial fibrillation: Secondary | ICD-10-CM | POA: Diagnosis not present

## 2020-04-06 DIAGNOSIS — D649 Anemia, unspecified: Secondary | ICD-10-CM | POA: Diagnosis not present

## 2020-04-06 DIAGNOSIS — Z681 Body mass index (BMI) 19 or less, adult: Secondary | ICD-10-CM | POA: Diagnosis not present

## 2020-04-06 DIAGNOSIS — Z89611 Acquired absence of right leg above knee: Secondary | ICD-10-CM | POA: Insufficient documentation

## 2020-04-06 DIAGNOSIS — Z8719 Personal history of other diseases of the digestive system: Secondary | ICD-10-CM | POA: Insufficient documentation

## 2020-04-06 DIAGNOSIS — I5042 Chronic combined systolic (congestive) and diastolic (congestive) heart failure: Secondary | ICD-10-CM | POA: Diagnosis not present

## 2020-04-06 DIAGNOSIS — I4892 Unspecified atrial flutter: Secondary | ICD-10-CM | POA: Diagnosis not present

## 2020-04-06 DIAGNOSIS — E039 Hypothyroidism, unspecified: Secondary | ICD-10-CM | POA: Diagnosis not present

## 2020-04-06 DIAGNOSIS — Z9981 Dependence on supplemental oxygen: Secondary | ICD-10-CM | POA: Diagnosis not present

## 2020-04-06 DIAGNOSIS — J9611 Chronic respiratory failure with hypoxia: Secondary | ICD-10-CM | POA: Diagnosis not present

## 2020-04-06 DIAGNOSIS — N138 Other obstructive and reflux uropathy: Secondary | ICD-10-CM | POA: Diagnosis not present

## 2020-04-06 DIAGNOSIS — D494 Neoplasm of unspecified behavior of bladder: Secondary | ICD-10-CM | POA: Diagnosis not present

## 2020-04-06 DIAGNOSIS — Z881 Allergy status to other antibiotic agents status: Secondary | ICD-10-CM | POA: Insufficient documentation

## 2020-04-06 DIAGNOSIS — R310 Gross hematuria: Secondary | ICD-10-CM | POA: Diagnosis not present

## 2020-04-06 DIAGNOSIS — I483 Typical atrial flutter: Secondary | ICD-10-CM | POA: Diagnosis not present

## 2020-04-06 DIAGNOSIS — I5032 Chronic diastolic (congestive) heart failure: Secondary | ICD-10-CM | POA: Diagnosis not present

## 2020-04-06 DIAGNOSIS — Z20822 Contact with and (suspected) exposure to covid-19: Secondary | ICD-10-CM | POA: Diagnosis not present

## 2020-04-06 DIAGNOSIS — S81812A Laceration without foreign body, left lower leg, initial encounter: Secondary | ICD-10-CM | POA: Diagnosis not present

## 2020-04-06 DIAGNOSIS — I110 Hypertensive heart disease with heart failure: Secondary | ICD-10-CM | POA: Diagnosis not present

## 2020-04-06 HISTORY — PX: TRANSURETHRAL RESECTION OF BLADDER TUMOR: SHX2575

## 2020-04-06 LAB — BASIC METABOLIC PANEL
Anion gap: 9 (ref 5–15)
BUN: 22 mg/dL (ref 8–23)
CO2: 32 mmol/L (ref 22–32)
Calcium: 8.9 mg/dL (ref 8.9–10.3)
Chloride: 91 mmol/L — ABNORMAL LOW (ref 98–111)
Creatinine, Ser: 1.07 mg/dL (ref 0.61–1.24)
GFR, Estimated: >60 mL/min (ref >60)
Glucose, Bld: 142 mg/dL — ABNORMAL HIGH (ref 70–99)
Potassium: 3.4 mmol/L — ABNORMAL LOW (ref 3.5–5.1)
Sodium: 132 mmol/L — ABNORMAL LOW (ref 135–145)

## 2020-04-06 LAB — CBC
HCT: 30.3 % — ABNORMAL LOW (ref 39.0–52.0)
Hemoglobin: 9.6 g/dL — ABNORMAL LOW (ref 13.0–17.0)
MCH: 30.9 pg (ref 26.0–34.0)
MCHC: 31.7 g/dL (ref 30.0–36.0)
MCV: 97.4 fL (ref 80.0–100.0)
Platelets: 228 10*3/uL (ref 150–400)
RBC: 3.11 MIL/uL — ABNORMAL LOW (ref 4.22–5.81)
RDW: 12.9 % (ref 11.5–15.5)
WBC: 10.8 10*3/uL — ABNORMAL HIGH (ref 4.0–10.5)
nRBC: 0 % (ref 0.0–0.2)

## 2020-04-06 SURGERY — TURBT (TRANSURETHRAL RESECTION OF BLADDER TUMOR)
Anesthesia: Spinal

## 2020-04-06 MED ORDER — PROPOFOL 10 MG/ML IV BOLUS
INTRAVENOUS | Status: DC | PRN
  Administered 2020-04-06: 20 mg via INTRAVENOUS
  Administered 2020-04-06 (×2): 10 mg via INTRAVENOUS

## 2020-04-06 MED ORDER — PROPOFOL 500 MG/50ML IV EMUL
INTRAVENOUS | Status: DC | PRN
  Administered 2020-04-06: 50 ug/kg/min via INTRAVENOUS

## 2020-04-06 MED ORDER — ALFUZOSIN HCL ER 10 MG PO TB24
10.0000 mg | ORAL_TABLET | Freq: Every day | ORAL | 11 refills | Status: DC
Start: 2020-04-06 — End: 2020-06-10

## 2020-04-06 MED ORDER — BELLADONNA ALKALOIDS-OPIUM 16.2-30 MG RE SUPP
RECTAL | Status: AC
  Filled 2020-04-06: qty 1

## 2020-04-06 MED ORDER — ONDANSETRON HCL 4 MG/2ML IJ SOLN
INTRAMUSCULAR | Status: DC | PRN
  Administered 2020-04-06: 4 mg via INTRAVENOUS

## 2020-04-06 MED ORDER — SODIUM CHLORIDE 0.9 % IR SOLN
Status: DC | PRN
  Administered 2020-04-06 (×5): 3000 mL

## 2020-04-06 MED ORDER — PHENYLEPHRINE HCL-NACL 10-0.9 MG/250ML-% IV SOLN
INTRAVENOUS | Status: DC | PRN
  Administered 2020-04-06: 30 ug/min via INTRAVENOUS

## 2020-04-06 MED ORDER — CEPHALEXIN 500 MG PO CAPS
500.0000 mg | ORAL_CAPSULE | Freq: Every day | ORAL | 0 refills | Status: DC
Start: 2020-04-06 — End: 2020-04-08

## 2020-04-06 MED ORDER — FENTANYL CITRATE (PF) 100 MCG/2ML IJ SOLN
INTRAMUSCULAR | Status: AC
  Filled 2020-04-06: qty 2

## 2020-04-06 MED ORDER — GENTAMICIN SULFATE 40 MG/ML IJ SOLN
320.0000 mg | INTRAVENOUS | Status: AC
  Administered 2020-04-06: 320 mg via INTRAVENOUS
  Filled 2020-04-06: qty 8

## 2020-04-06 MED ORDER — CHLORHEXIDINE GLUCONATE 0.12 % MT SOLN: 15.0000 mL | Freq: Once | OROMUCOSAL | Status: DC

## 2020-04-06 MED ORDER — PHENYLEPHRINE 40 MCG/ML (10ML) SYRINGE FOR IV PUSH (FOR BLOOD PRESSURE SUPPORT)
PREFILLED_SYRINGE | INTRAVENOUS | Status: DC | PRN
  Administered 2020-04-06: 120 ug via INTRAVENOUS
  Administered 2020-04-06 (×2): 80 ug via INTRAVENOUS
  Administered 2020-04-06: 120 ug via INTRAVENOUS

## 2020-04-06 MED ORDER — VANCOMYCIN HCL IN DEXTROSE 1-5 GM/200ML-% IV SOLN
1000.0000 mg | Freq: Once | INTRAVENOUS | Status: AC
  Administered 2020-04-06: 1000 mg via INTRAVENOUS

## 2020-04-06 MED ORDER — BELLADONNA ALKALOIDS-OPIUM 16.2-60 MG RE SUPP
RECTAL | Status: DC | PRN
  Administered 2020-04-06: 1 via RECTAL

## 2020-04-06 MED ORDER — VANCOMYCIN HCL IN DEXTROSE 1-5 GM/200ML-% IV SOLN
INTRAVENOUS | Status: AC
  Filled 2020-04-06: qty 200

## 2020-04-06 MED ORDER — GEMCITABINE CHEMO FOR BLADDER INSTILLATION 2000 MG
2000.0000 mg | Freq: Once | INTRAVENOUS | Status: AC
  Administered 2020-04-06: 2000 mg via INTRAVESICAL
  Filled 2020-04-06: qty 2000

## 2020-04-06 MED ORDER — BUPIVACAINE IN DEXTROSE 0.75-8.25 % IT SOLN
INTRATHECAL | Status: DC | PRN
  Administered 2020-04-06: 1.6 mL via INTRATHECAL

## 2020-04-06 MED ORDER — ORAL CARE MOUTH RINSE: 15.0000 mL | Freq: Once | OROMUCOSAL | Status: DC

## 2020-04-06 MED ORDER — PHENYLEPHRINE 40 MCG/ML (10ML) SYRINGE FOR IV PUSH (FOR BLOOD PRESSURE SUPPORT)
PREFILLED_SYRINGE | INTRAVENOUS | Status: AC
  Filled 2020-04-06: qty 10

## 2020-04-06 MED ORDER — LACTATED RINGERS IV SOLN: INTRAVENOUS | Status: DC

## 2020-04-06 MED ORDER — DEXAMETHASONE SODIUM PHOSPHATE 10 MG/ML IJ SOLN
INTRAMUSCULAR | Status: DC | PRN
  Administered 2020-04-06: 5 mg via INTRAVENOUS

## 2020-04-06 SURGICAL SUPPLY — 16 items
BAG DRN RND TRDRP ANRFLXCHMBR (UROLOGICAL SUPPLIES) ×1
BAG URINE DRAIN 2000ML AR STRL (UROLOGICAL SUPPLIES) ×2 IMPLANT
BAG URO CATCHER STRL LF (MISCELLANEOUS) ×2 IMPLANT
CATH FOLEY 2WAY SLVR 30CC 20FR (CATHETERS) ×2 IMPLANT
CNTNR URN SCR LID CUP LEK RST (MISCELLANEOUS) ×1 IMPLANT
CONT SPEC 4OZ STRL OR WHT (MISCELLANEOUS) ×2
DRAPE FOOT SWITCH (DRAPES) ×2 IMPLANT
GLOVE SURG ENC TEXT LTX SZ7.5 (GLOVE) ×2 IMPLANT
GOWN STRL REUS W/TWL XL LVL3 (GOWN DISPOSABLE) ×2 IMPLANT
KIT TURNOVER KIT A (KITS) ×2 IMPLANT
LOOP CUT BIPOLAR 24F LRG (ELECTROSURGICAL) ×2 IMPLANT
MANIFOLD NEPTUNE II (INSTRUMENTS) ×2 IMPLANT
PACK CYSTO (CUSTOM PROCEDURE TRAY) ×2 IMPLANT
SYR CONTROL 10ML LL (SYRINGE) ×2 IMPLANT
TUBING CONNECTING 10 (TUBING) ×2 IMPLANT
TUBING UROLOGY SET (TUBING) IMPLANT

## 2020-04-06 NOTE — Op Note (Signed)
Preoperative diagnosis: Gross hematuria, bladder neoplasm of uncertain malignant potential  Postoperative diagnosis: Same  Procedure: TURBT 2 to 5 cm, TURP  Surgeon: Junious Silk  Anesthesia: General  Indication for procedure: [name] is an 87 year old male with a history of BPH and urinary retention.  He has had an indwelling Foley catheter for a few months.  He noted gross hematuria.  Office cystoscopy revealed a bladder tumor and this was noted to be seen on CT scan as well but no sign of any metastatic disease.  Also the left ureteral thickening was stable over time with no left Hydro.  He was brought today in hopes to palliate his symptoms of gross hematuria, make the catheter more comfortable and prevent future bleeding and catheter obstruction.  Findings: On exam under anesthesia the penis was circumcised, on DRE the prostate was about 50 g and smooth without hard area or nodule.  No specific mass was noted.  On cystoscopy there was a fleshy bladder tumor at the left bladder neck from approximately 1:00 down to 4:00.  This completely obstructed the bladder neck.  It had components of higher grade appearing papillary configuration but also components of more solid fleshy polyp appearing configuration.  As far as the prostate the right lateral lobe was smooth and not obstructing in the left lateral lobe extended from this polyp into the urethral lumen and obstructed the prostatic urethra.  Patient very hopeful to get the catheter out and I channeled out the left lateral lobe of the prostate in hopes he would be able to urinate.  I do believe he has a good chance.  Otherwise the bladder was unremarkable.  No stone or foreign body in the bladder.  The ureteral orifice on the left and the right were normal.  There was clear reflux.  There was moderate bladder trabeculation.  Description of procedure: After consent was obtained patient brought to the operating room.  After adequate anesthesia he was  placed on the ottoman position and prepped and draped in the usual sterile fashion.  Timeout was performed to confirm the patient and procedure.  Cystoscope was passed per urethra and the bladder inspected with a 30 degree and 70 degree lens.  I then swapped out for the continuous flow resectoscope sheath passed with the visual obturator and then swapped out out for the handle and loop.  I then began to resect the tumor and resected it back to its attachments right at the bladder neck.  After removing it it fell into the bladder.  The base was fulgurated.  Interestingly the tumor was on a more narrow stalk and a large bit of it passed out into the bladder.  It would not readily drain through the resectoscope sheath.  I had to resect some of the tumor while it was mobile in the bladder and then fill the bladder and jostle the resectoscope sheath to get some of the large pieces out.  These pieces were fleshy and thick.  All this was sent as bladder tumor.  Excellent hemostasis.  There was 1 focal bulge of the left lateral lobe of the prostate that was blocking some of the prostatic urethra and I quickly shaved this down to give him a better channel.  The right lobe interestingly was not obstructed.  This was fulgurated and again hemostasis was excellent.  These were sent as TURP chips.  I then removed the resectoscope and passed the cystoscope again and as is typical with the 70 degree lens its  difficult to see but I did not readily see any residual tumor along the bladder neck.  The scope was removed and a 34 [place] Foley catheter was placed in left to gravity drainage.  Drainage was clear.  He was awakened and taken recovery room in stable condition.  Complications: None  Blood loss: Minimal  Specimens to pathology: #1 left bladder neck tumor  #2 TURP chips  Drains: 20 French Foley catheter  Disposition: Patient stable to PACU - spoke to [name] re: procedure, post-op care and f/u

## 2020-04-06 NOTE — Telephone Encounter (Signed)
[name] spouse called to obtain referral for a shoe to give him more stability to walk

## 2020-04-06 NOTE — Discharge Instructions (Signed)
Indwelling Urinary Catheter Care, Adult An indwelling urinary catheter is a thin tube that is put into your bladder. The tube helps to drain pee (urine) out of your body. The tube goes in through your urethra. Your urethra is where pee comes out of your body. Your pee will come out through the catheter, then it will go into a bag (drainage bag). Take good care of your catheter so it will work well.  Removal of the Foley: Remove the Foley as instructed on on Monday morning April 12, 2020.  Drink plenty of water and call the office if you have any problems voiding by 3 pm.   How to wear your catheter and bag Supplies needed  Sticky tape (adhesive tape) or a leg strap.  Alcohol wipe or soap and water (if you use tape).  A clean towel (if you use tape).  Large overnight bag.  Smaller bag (leg bag). Wearing your catheter Attach your catheter to your leg with tape or a leg strap.  Make sure the catheter is not pulled tight.  If a leg strap gets wet, take it off and put on a dry strap.  If you use tape to hold the bag on your leg: 1. Use an alcohol wipe or soap and water to wash your skin where the tape made it sticky before. 2. Use a clean towel to pat-dry that skin. 3. Use new tape to make the bag stay on your leg. Wearing your bags You should have been given a large overnight bag.  You may wear the overnight bag in the day or night.  Always have the overnight bag lower than your bladder.  Do not let the bag touch the floor.  Before you go to sleep, put a clean plastic bag in a wastebasket. Then hang the overnight bag inside the wastebasket. You should also have a smaller leg bag that fits under your clothes.  Always wear the leg bag below your knee.  Do not wear your leg bag at night. How to care for your skin and catheter Supplies needed  A clean washcloth.  Water and mild soap.  A clean towel. Caring for your skin and catheter  Clean the skin around your catheter  every day: 1. Wash your hands with soap and water. 2. Wet a clean washcloth in warm water and mild soap. 3. Clean the skin around your urethra.  If you are male:  Gently spread the folds of skin around your vagina (labia).  With the washcloth in your other hand, wipe the inner side of your labia on each side. Wipe from front to back.  If you are male:  Pull back any skin that covers the end of your penis (foreskin).  With the washcloth in your other hand, wipe your penis in small circles. Start wiping at the tip of your penis, then move away from the catheter.  Move the foreskin back in place, if needed. 4. With your free hand, hold the catheter close to where it goes into your body.  Keep holding the catheter during cleaning so it does not get pulled out. 5. With the washcloth in your other hand, clean the catheter.  Only wipe downward on the catheter.  Do not wipe upward toward your body. Doing this may push germs into your urethra and cause infection. 6. Use a clean towel to pat-dry the catheter and the skin around it. Make sure to wipe off all soap. 7. Wash your hands with soap  and water.  Shower every day. Do not take baths.  Do not use cream, ointment, or lotion on the area where the catheter goes into your body, unless your doctor tells you to.  Do not use powders, sprays, or lotions on your genital area.  Check your skin around the catheter every day for signs of infection. Check for: ? Redness, swelling, or pain. ? Fluid or blood. ? Warmth. ? Pus or a bad smell.      How to empty the bag Supplies needed  Rubbing alcohol.  Gauze pad or cotton ball.  Tape or a leg strap. Emptying the bag Pour the pee out of your bag when it is ?- full, or at least 2-3 times a day. Do this for your overnight bag and your leg bag. 1. Wash your hands with soap and water. 2. Separate (detach) the bag from your leg. 3. Hold the bag over the toilet or a clean pail. Keep the  bag lower than your hips and bladder. This is so the pee (urine) does not go back into the tube. 4. Open the pour spout. It is at the bottom of the bag. 5. Empty the pee into the toilet or pail. Do not let the pour spout touch any surface. 6. Put rubbing alcohol on a gauze pad or cotton ball. 7. Use the gauze pad or cotton ball to clean the pour spout. 8. Close the pour spout. 9. Attach the bag to your leg with tape or a leg strap. 10. Wash your hands with soap and water. Follow instructions for cleaning the drainage bag:  From the product maker.  As told by your doctor. How to change the bag Supplies needed  Alcohol wipes.  A clean bag.  Tape or a leg strap. Changing the bag Replace your bag when it starts to leak, smell bad, or look dirty. 1. Wash your hands with soap and water. 2. Separate the dirty bag from your leg. 3. Pinch the catheter with your fingers so that pee does not spill out. 4. Separate the catheter tube from the bag tube where these tubes connect (at the connection valve). Do not let the tubes touch any surface. 5. Clean the end of the catheter tube with an alcohol wipe. Use a different alcohol wipe to clean the end of the bag tube. 6. Connect the catheter tube to the tube of the clean bag. 7. Attach the clean bag to your leg with tape or a leg strap. Do not make the bag tight on your leg. 8. Wash your hands with soap and water. General rules  Never pull on your catheter. Never try to take it out. Doing that can hurt you.  Always wash your hands before and after you touch your catheter or bag. Use a mild, fragrance-free soap. If you do not have soap and water, use hand sanitizer.  Always make sure there are no twists or bends (kinks) in the catheter tube.  Always make sure there are no leaks in the catheter or bag.  Drink enough fluid to keep your pee pale yellow.  Do not take baths, swim, or use a hot tub.  If you are male, wipe from front to back  after you poop (have a bowel movement).   Contact a doctor if:  Your pee is cloudy.  Your pee smells worse than usual.  Your catheter gets clogged.  Your catheter leaks.  Your bladder feels full. Get help right away if:  You  have redness, swelling, or pain where the catheter goes into your body.  You have fluid, blood, pus, or a bad smell coming from the area where the catheter goes into your body.  Your skin feels warm where the catheter goes into your body.  You have a fever.  You have pain in your: ? Belly (abdomen). ? Legs. ? Lower back. ? Bladder.  You see blood in the catheter.  Your pee is pink or red.  You feel sick to your stomach (nauseous).  You throw up (vomit).  You have chills.  Your pee is not draining into the bag.  Your catheter gets pulled out. Summary  An indwelling urinary catheter is a thin tube that is placed into the bladder to help drain pee (urine) out of the body.  The catheter is placed into the part of the body that drains pee from the bladder (urethra).  Taking good care of your catheter will keep it working properly and help prevent problems.  Always wash your hands before and after touching your catheter or bag.  Never pull on your catheter or try to take it out. This information is not intended to replace advice given to you by your health care provider. Make sure you discuss any questions you have with your health care provider. Document Revised: 04/19/2018 Document Reviewed: 08/11/2016 Elsevier Patient Education  [place].

## 2020-04-06 NOTE — Progress Notes (Signed)
When this nurse giving DC instruction, wife was so upset to discharge him this late night and pharmacy is closing @ 8pm. Tried call the pharmacy 10 mins before 8pm . Also Amy called 24 hrs pharmacy to tx the prescribtion meds, arranged and noted wife. Meanwhile [name] came by talked to pt. Pt was anxious to O2 level,if  Blood clotted  During the night, too weak to go home. MD told O2 sat is 100% with O2 2L at home setting, if clotted, can irrigation with saline with help or clean in the morning at the office. Also MD talked to wife that he will check in am.  Also MD said medicine started in am. Gave several NS syringes to wife and instruction. tx to car with assistant.

## 2020-04-06 NOTE — Progress Notes (Signed)
I came in to assess [name] as he was complaining of being too weak to go home and that his sats were less than 90%. On assessment of pt, he is alert and oriented. He is at baseline strength. He transferred from bed to wheelchair with assistance. He had a TURBT of a tumor on a short stalk at bladder beck and very limited resected of part of left lateral lobe of prostate without general anesthesia. Urine light red in tubing and had been draining all afternoon and evening with no clots. His vitals are stable and his sats at 97-100% on his usual O2. He is at his usual SOB and baseline work of breathing (non-labored).  When I spoke to his wife she reports she didn't have any assistance but she did reach out to her daughter who is here with her to pick up her dad and assist her mom.  I walked out to lobby with patient and his wife and they seem to be doing well. He does not require admission at this time from a procedural point of view and no complications are noted. However, I did discuss if any issues call the office, 911 or go to ED as even at his baseline he is very frail.

## 2020-04-06 NOTE — Progress Notes (Signed)
D; [name] requested pt needs stay hospital for not convinient to go home now. Pt came in phase 2, finished chemo @ 1830. Call to Dr and waiting for respond.

## 2020-04-06 NOTE — Telephone Encounter (Signed)
Please see notes, I have already addressed this question.

## 2020-04-06 NOTE — Addendum Note (Signed)
Addendum  created 04/06/20 1854 by Janeece Riggers, MD   Clinical Note Signed

## 2020-04-06 NOTE — Progress Notes (Addendum)
Called to see patient in recovery room for oral bleeding.  On examination bright red blood on base of tongue, further inspection reveals multiple ecchymotic spots on LEFT hard palate.  No active bleeding noted.  This may have been secondary to oral airway.    Pt told to rinse with cold waster.  Heme clears with no active bleeding noted.  Will follow up prior to discharge from PACU.  ej Faustina Gebert jrmd

## 2020-04-06 NOTE — Anesthesia Postprocedure Evaluation (Signed)
Anesthesia Post Note  Patient: [name]  Procedure(s) Performed: TRANSURETHRAL RESECTION OF BLADDER TUMOR (TURBT)/ POST OPERATIVE INSTILLATION OF GEMCITABINE (N/A )     Patient location during evaluation: PACU Anesthesia Type: Spinal and MAC Level of consciousness: awake and alert Pain management: pain level controlled Vital Signs Assessment: post-procedure vital signs reviewed and stable Respiratory status: spontaneous breathing, nonlabored ventilation and respiratory function stable Cardiovascular status: blood pressure returned to baseline and stable Postop Assessment: no apparent nausea or vomiting, no headache, no backache and spinal receding Anesthetic complications: no Comments: Some bruising of the upper left hard palate presumed to be from hard oral airway, minor bleeding resolving w/ swishing of water    No complications documented.  Last Vitals:  Vitals:   04/06/20 1545 04/06/20 1600  BP: (!) 77/55 (!) 89/50  Pulse: 81 81  Resp: 20 15  Temp:    SpO2: 100% 100%    Last Pain:  Vitals:   04/06/20 1600  TempSrc:   PainSc: 0-No pain                 Pervis Hocking

## 2020-04-06 NOTE — Transfer of Care (Signed)
Immediate Anesthesia Transfer of Care Note  Patient: [name]  Procedure(s) Performed: TRANSURETHRAL RESECTION OF BLADDER TUMOR (TURBT)/ POST OPERATIVE INSTILLATION OF GEMCITABINE (N/A )  Patient Location: PACU  Anesthesia Type:Spinal  Level of Consciousness: awake  Airway & Oxygen Therapy: Patient Spontanous Breathing and Patient connected to face mask oxygen  Post-op Assessment: Report given to RN and Post -op Vital signs reviewed and stable  Post vital signs: Reviewed and stable  Last Vitals:  Vitals Value Taken Time  BP 90/57 04/06/20 1536  Temp    Pulse 82 04/06/20 1539  Resp 18 04/06/20 1540  SpO2 92 % 04/06/20 1539  Vitals shown include unvalidated device data.  Last Pain:  Vitals:   04/06/20 1228  TempSrc: Oral         Complications: No complications documented.

## 2020-04-06 NOTE — Progress Notes (Addendum)
I spoke to Mrs. Batts and she didn't want to take [name] home because she doesn't have any help. I asked who would help her in the morning and she said her daughter, who could help her now. She was going to call her daughter for assistance which is appropriate. I also called the daughter and left a message saying her mom needs some help. This is a long term issue for [name] as he still lives at home. I spoke to Nurse Tasia Catchings. Pt is doing well from procedure standpoint and has stable vitals and ready for discharge.

## 2020-04-06 NOTE — Anesthesia Procedure Notes (Signed)
Spinal  Patient location during procedure: OR Start time: 04/06/2020 2:08 PM End time: 04/06/2020 2:12 PM Reason for block: surgical anesthesia Staffing Performed: resident/CRNA  Anesthesiologist: Roderic Palau, MD Resident/CRNA: Genelle Bal, CRNA Preanesthetic Checklist Completed: patient identified, IV checked, site marked, risks and benefits discussed, surgical consent, monitors and equipment checked, pre-op evaluation and timeout performed Spinal Block Patient position: sitting Prep: DuraPrep Patient monitoring: heart rate, cardiac monitor, continuous pulse ox and blood pressure Approach: midline Location: L3-4 Injection technique: single-shot Needle Needle type: Sprotte  Needle gauge: 24 G Needle length: 9 cm Assessment Sensory level: T4 Events: CSF return Additional Notes Pt ID'd. Consent/allergies verified. Pt sitting for spinal. Spinal attempt x1, + clear, free-flowing CSF, easily aspirated/injected. - paresthesia.

## 2020-04-06 NOTE — Interval H&P Note (Signed)
History and Physical Interval Note:  04/06/2020 1:58 PM  [name]  has presented today for surgery, with the diagnosis of BLADDER NEOPLASM, GROSS HEMATURIA.  The various methods of treatment have been discussed with the patient and family. After consideration of risks, benefits and other options for treatment, the patient has consented to  Procedure(s): TRANSURETHRAL RESECTION OF BLADDER TUMOR (TURBT)/ POST OPERATIVE INSTILLATION OF GEMCITABINE (N/A) as a surgical intervention.  The patient's history has been reviewed, patient examined, no change in status, stable for surgery.  I have reviewed the patient's chart and labs.  Questions were answered to the patient's satisfaction. Plan spinal. He is well. No fever or chills. Continues to have some blood in catheter.    Festus Aloe

## 2020-04-07 ENCOUNTER — Encounter (HOSPITAL_COMMUNITY): Payer: Self-pay | Admitting: Urology

## 2020-04-08 ENCOUNTER — Other Ambulatory Visit: Payer: Self-pay

## 2020-04-08 ENCOUNTER — Encounter: Payer: Medicare Other | Admitting: Physical Medicine & Rehabilitation

## 2020-04-08 ENCOUNTER — Inpatient Hospital Stay (HOSPITAL_COMMUNITY)
Admission: EM | Admit: 2020-04-08 | Discharge: 2020-04-10 | DRG: 988 | Disposition: A | Discharge status: Home Health | Payer: Medicare Other | Attending: Family Medicine | Admitting: Family Medicine

## 2020-04-08 ENCOUNTER — Emergency Department (HOSPITAL_COMMUNITY): Payer: Medicare Other

## 2020-04-08 ENCOUNTER — Encounter (HOSPITAL_COMMUNITY): Payer: Self-pay | Admitting: Emergency Medicine

## 2020-04-08 ENCOUNTER — Encounter: Payer: Medicare Other | Admitting: Psychology

## 2020-04-08 DIAGNOSIS — Z23 Encounter for immunization: Secondary | ICD-10-CM

## 2020-04-08 DIAGNOSIS — C675 Malignant neoplasm of bladder neck: Secondary | ICD-10-CM | POA: Diagnosis present

## 2020-04-08 DIAGNOSIS — Z823 Family history of stroke: Secondary | ICD-10-CM

## 2020-04-08 DIAGNOSIS — R338 Other retention of urine: Secondary | ICD-10-CM | POA: Diagnosis present

## 2020-04-08 DIAGNOSIS — R000 Tachycardia, unspecified: Secondary | ICD-10-CM | POA: Diagnosis not present

## 2020-04-08 DIAGNOSIS — Z79899 Other long term (current) drug therapy: Secondary | ICD-10-CM

## 2020-04-08 DIAGNOSIS — I5032 Chronic diastolic (congestive) heart failure: Secondary | ICD-10-CM | POA: Diagnosis present

## 2020-04-08 DIAGNOSIS — R609 Edema, unspecified: Secondary | ICD-10-CM | POA: Diagnosis not present

## 2020-04-08 DIAGNOSIS — I4510 Unspecified right bundle-branch block: Secondary | ICD-10-CM | POA: Diagnosis present

## 2020-04-08 DIAGNOSIS — E782 Mixed hyperlipidemia: Secondary | ICD-10-CM

## 2020-04-08 DIAGNOSIS — N401 Enlarged prostate with lower urinary tract symptoms: Secondary | ICD-10-CM | POA: Diagnosis present

## 2020-04-08 DIAGNOSIS — I70201 Unspecified atherosclerosis of native arteries of extremities, right leg: Secondary | ICD-10-CM | POA: Diagnosis present

## 2020-04-08 DIAGNOSIS — I4891 Unspecified atrial fibrillation: Secondary | ICD-10-CM

## 2020-04-08 DIAGNOSIS — J449 Chronic obstructive pulmonary disease, unspecified: Secondary | ICD-10-CM | POA: Diagnosis not present

## 2020-04-08 DIAGNOSIS — Y92009 Unspecified place in unspecified non-institutional (private) residence as the place of occurrence of the external cause: Secondary | ICD-10-CM | POA: Diagnosis not present

## 2020-04-08 DIAGNOSIS — I110 Hypertensive heart disease with heart failure: Secondary | ICD-10-CM | POA: Diagnosis present

## 2020-04-08 DIAGNOSIS — Z951 Presence of aortocoronary bypass graft: Secondary | ICD-10-CM

## 2020-04-08 DIAGNOSIS — Z880 Allergy status to penicillin: Secondary | ICD-10-CM

## 2020-04-08 DIAGNOSIS — Z89611 Acquired absence of right leg above knee: Secondary | ICD-10-CM

## 2020-04-08 DIAGNOSIS — I252 Old myocardial infarction: Secondary | ICD-10-CM

## 2020-04-08 DIAGNOSIS — Z888 Allergy status to other drugs, medicaments and biological substances status: Secondary | ICD-10-CM

## 2020-04-08 DIAGNOSIS — I959 Hypotension, unspecified: Secondary | ICD-10-CM | POA: Diagnosis present

## 2020-04-08 DIAGNOSIS — W06XXXA Fall from bed, initial encounter: Secondary | ICD-10-CM | POA: Diagnosis present

## 2020-04-08 DIAGNOSIS — S81812A Laceration without foreign body, left lower leg, initial encounter: Secondary | ICD-10-CM | POA: Diagnosis not present

## 2020-04-08 DIAGNOSIS — I4892 Unspecified atrial flutter: Secondary | ICD-10-CM | POA: Diagnosis not present

## 2020-04-08 DIAGNOSIS — Z882 Allergy status to sulfonamides status: Secondary | ICD-10-CM

## 2020-04-08 DIAGNOSIS — D649 Anemia, unspecified: Secondary | ICD-10-CM

## 2020-04-08 DIAGNOSIS — M159 Polyosteoarthritis, unspecified: Secondary | ICD-10-CM | POA: Diagnosis present

## 2020-04-08 DIAGNOSIS — R58 Hemorrhage, not elsewhere classified: Secondary | ICD-10-CM | POA: Diagnosis present

## 2020-04-08 DIAGNOSIS — D638 Anemia in other chronic diseases classified elsewhere: Secondary | ICD-10-CM | POA: Diagnosis present

## 2020-04-08 DIAGNOSIS — E039 Hypothyroidism, unspecified: Secondary | ICD-10-CM | POA: Diagnosis present

## 2020-04-08 DIAGNOSIS — G8929 Other chronic pain: Secondary | ICD-10-CM | POA: Diagnosis present

## 2020-04-08 DIAGNOSIS — Z9981 Dependence on supplemental oxygen: Secondary | ICD-10-CM

## 2020-04-08 DIAGNOSIS — Z8701 Personal history of pneumonia (recurrent): Secondary | ICD-10-CM

## 2020-04-08 DIAGNOSIS — Z8249 Family history of ischemic heart disease and other diseases of the circulatory system: Secondary | ICD-10-CM

## 2020-04-08 DIAGNOSIS — Z79890 Hormone replacement therapy (postmenopausal): Secondary | ICD-10-CM

## 2020-04-08 DIAGNOSIS — Z7982 Long term (current) use of aspirin: Secondary | ICD-10-CM

## 2020-04-08 DIAGNOSIS — F32A Depression, unspecified: Secondary | ICD-10-CM | POA: Diagnosis present

## 2020-04-08 DIAGNOSIS — Z85828 Personal history of other malignant neoplasm of skin: Secondary | ICD-10-CM

## 2020-04-08 DIAGNOSIS — H9190 Unspecified hearing loss, unspecified ear: Secondary | ICD-10-CM | POA: Diagnosis present

## 2020-04-08 DIAGNOSIS — Z681 Body mass index (BMI) 19 or less, adult: Secondary | ICD-10-CM

## 2020-04-08 DIAGNOSIS — Z66 Do not resuscitate: Secondary | ICD-10-CM | POA: Diagnosis present

## 2020-04-08 DIAGNOSIS — R296 Repeated falls: Secondary | ICD-10-CM | POA: Diagnosis present

## 2020-04-08 DIAGNOSIS — F419 Anxiety disorder, unspecified: Secondary | ICD-10-CM | POA: Diagnosis present

## 2020-04-08 DIAGNOSIS — I480 Paroxysmal atrial fibrillation: Principal | ICD-10-CM | POA: Diagnosis present

## 2020-04-08 DIAGNOSIS — Z8349 Family history of other endocrine, nutritional and metabolic diseases: Secondary | ICD-10-CM

## 2020-04-08 DIAGNOSIS — F411 Generalized anxiety disorder: Secondary | ICD-10-CM | POA: Diagnosis present

## 2020-04-08 DIAGNOSIS — Z7189 Other specified counseling: Secondary | ICD-10-CM

## 2020-04-08 DIAGNOSIS — Z881 Allergy status to other antibiotic agents status: Secondary | ICD-10-CM

## 2020-04-08 DIAGNOSIS — Z7951 Long term (current) use of inhaled steroids: Secondary | ICD-10-CM

## 2020-04-08 DIAGNOSIS — Y92013 Bedroom of single-family (private) house as the place of occurrence of the external cause: Secondary | ICD-10-CM

## 2020-04-08 DIAGNOSIS — Z87891 Personal history of nicotine dependence: Secondary | ICD-10-CM

## 2020-04-08 DIAGNOSIS — E785 Hyperlipidemia, unspecified: Secondary | ICD-10-CM | POA: Diagnosis present

## 2020-04-08 DIAGNOSIS — I10 Essential (primary) hypertension: Secondary | ICD-10-CM | POA: Diagnosis not present

## 2020-04-08 DIAGNOSIS — N3289 Other specified disorders of bladder: Secondary | ICD-10-CM | POA: Diagnosis present

## 2020-04-08 DIAGNOSIS — N139 Obstructive and reflux uropathy, unspecified: Secondary | ICD-10-CM | POA: Diagnosis present

## 2020-04-08 DIAGNOSIS — K219 Gastro-esophageal reflux disease without esophagitis: Secondary | ICD-10-CM | POA: Diagnosis present

## 2020-04-08 DIAGNOSIS — Z7952 Long term (current) use of systemic steroids: Secondary | ICD-10-CM

## 2020-04-08 DIAGNOSIS — I6529 Occlusion and stenosis of unspecified carotid artery: Secondary | ICD-10-CM | POA: Diagnosis present

## 2020-04-08 DIAGNOSIS — M7989 Other specified soft tissue disorders: Secondary | ICD-10-CM | POA: Diagnosis not present

## 2020-04-08 DIAGNOSIS — W19XXXA Unspecified fall, initial encounter: Secondary | ICD-10-CM

## 2020-04-08 DIAGNOSIS — Z20822 Contact with and (suspected) exposure to covid-19: Secondary | ICD-10-CM | POA: Diagnosis present

## 2020-04-08 DIAGNOSIS — J9611 Chronic respiratory failure with hypoxia: Secondary | ICD-10-CM | POA: Diagnosis present

## 2020-04-08 DIAGNOSIS — F0390 Unspecified dementia without behavioral disturbance: Secondary | ICD-10-CM | POA: Diagnosis present

## 2020-04-08 DIAGNOSIS — N138 Other obstructive and reflux uropathy: Secondary | ICD-10-CM | POA: Diagnosis present

## 2020-04-08 DIAGNOSIS — I739 Peripheral vascular disease, unspecified: Secondary | ICD-10-CM | POA: Diagnosis not present

## 2020-04-08 DIAGNOSIS — Z803 Family history of malignant neoplasm of breast: Secondary | ICD-10-CM

## 2020-04-08 DIAGNOSIS — Z8639 Personal history of other endocrine, nutritional and metabolic disease: Secondary | ICD-10-CM

## 2020-04-08 DIAGNOSIS — Z87442 Personal history of urinary calculi: Secondary | ICD-10-CM

## 2020-04-08 DIAGNOSIS — I2510 Atherosclerotic heart disease of native coronary artery without angina pectoris: Secondary | ICD-10-CM | POA: Diagnosis present

## 2020-04-08 DIAGNOSIS — R627 Adult failure to thrive: Secondary | ICD-10-CM | POA: Diagnosis present

## 2020-04-08 DIAGNOSIS — I483 Typical atrial flutter: Secondary | ICD-10-CM | POA: Diagnosis present

## 2020-04-08 HISTORY — DX: Other chronic pain: G89.29

## 2020-04-08 HISTORY — DX: Unspecified fall, initial encounter: W19.XXXA

## 2020-04-08 LAB — CBC
HCT: 27.3 % — ABNORMAL LOW (ref 39.0–52.0)
HCT: 25.4 % — ABNORMAL LOW (ref 39.0–52.0)
Hemoglobin: 8.7 g/dL — ABNORMAL LOW (ref 13.0–17.0)
Hemoglobin: 8 g/dL — ABNORMAL LOW (ref 13.0–17.0)
MCH: 30.7 pg (ref 26.0–34.0)
MCH: 30.9 pg (ref 26.0–34.0)
MCHC: 31.9 g/dL (ref 30.0–36.0)
MCHC: 31.5 g/dL (ref 30.0–36.0)
MCV: 96.5 fL (ref 80.0–100.0)
MCV: 98.1 fL (ref 80.0–100.0)
Platelets: 196 10*3/uL (ref 150–400)
Platelets: 170 10*3/uL (ref 150–400)
RBC: 2.83 MIL/uL — ABNORMAL LOW (ref 4.22–5.81)
RBC: 2.59 MIL/uL — ABNORMAL LOW (ref 4.22–5.81)
RDW: 12.8 % (ref 11.5–15.5)
RDW: 13 % (ref 11.5–15.5)
WBC: 7.3 10*3/uL (ref 4.0–10.5)
WBC: 8.4 10*3/uL (ref 4.0–10.5)
nRBC: 0 % (ref 0.0–0.2)
nRBC: 0 % (ref 0.0–0.2)

## 2020-04-08 LAB — BASIC METABOLIC PANEL
Anion gap: 7 (ref 5–15)
BUN: 25 mg/dL — ABNORMAL HIGH (ref 8–23)
CO2: 34 mmol/L — ABNORMAL HIGH (ref 22–32)
Calcium: 8.9 mg/dL (ref 8.9–10.3)
Chloride: 93 mmol/L — ABNORMAL LOW (ref 98–111)
Creatinine, Ser: 1.38 mg/dL — ABNORMAL HIGH (ref 0.61–1.24)
GFR, Estimated: 49 mL/min — ABNORMAL LOW (ref >60)
Glucose, Bld: 103 mg/dL — ABNORMAL HIGH (ref 70–99)
Potassium: 3.2 mmol/L — ABNORMAL LOW (ref 3.5–5.1)
Sodium: 134 mmol/L — ABNORMAL LOW (ref 135–145)

## 2020-04-08 LAB — RESP PANEL BY RT-PCR (FLU A&B, COVID) ARPGX2
Influenza A by PCR: NEGATIVE
Influenza B by PCR: NEGATIVE
SARS Coronavirus 2 by RT PCR: NEGATIVE

## 2020-04-08 LAB — SURGICAL PATHOLOGY

## 2020-04-08 MED ORDER — AMIODARONE LOAD VIA INFUSION
150.0000 mg | Freq: Once | INTRAVENOUS | Status: AC
  Administered 2020-04-08: 150 mg via INTRAVENOUS
  Filled 2020-04-08: qty 83.34

## 2020-04-08 MED ORDER — POLYETHYL GLYCOL-PROPYL GLYCOL 0.4-0.3 % OP SOLN: 1.0000 [drp] | Freq: Three times a day (TID) | OPHTHALMIC | Status: DC | PRN

## 2020-04-08 MED ORDER — ACETAMINOPHEN 650 MG RE SUPP: 650.0000 mg | Freq: Four times a day (QID) | RECTAL | Status: DC | PRN

## 2020-04-08 MED ORDER — SERTRALINE HCL 25 MG PO TABS
25.0000 mg | ORAL_TABLET | Freq: Every day | ORAL | Status: DC
  Administered 2020-04-09 – 2020-04-10 (×2): 25 mg via ORAL
  Filled 2020-04-08 (×3): qty 1

## 2020-04-08 MED ORDER — ATORVASTATIN CALCIUM 40 MG PO TABS: 40.0000 mg | ORAL_TABLET | Freq: Every day | ORAL | Status: DC

## 2020-04-08 MED ORDER — POTASSIUM CHLORIDE CRYS ER 20 MEQ PO TBCR
40.0000 meq | EXTENDED_RELEASE_TABLET | Freq: Once | ORAL | Status: AC
  Administered 2020-04-08: 40 meq via ORAL
  Filled 2020-04-08: qty 2

## 2020-04-08 MED ORDER — POTASSIUM CHLORIDE CRYS ER 20 MEQ PO TBCR
60.0000 meq | EXTENDED_RELEASE_TABLET | Freq: Once | ORAL | Status: AC
  Administered 2020-04-08: 60 meq via ORAL
  Filled 2020-04-08: qty 3

## 2020-04-08 MED ORDER — DOXYCYCLINE HYCLATE 50 MG PO CAPS
50.0000 mg | ORAL_CAPSULE | Freq: Two times a day (BID) | ORAL | Status: DC
  Administered 2020-04-08 – 2020-04-10 (×5): 50 mg via ORAL
  Filled 2020-04-08 (×8): qty 1

## 2020-04-08 MED ORDER — SODIUM CHLORIDE (HYPERTONIC) 5 % OP SOLN
1.0000 [drp] | Freq: Four times a day (QID) | OPHTHALMIC | Status: DC
  Administered 2020-04-08 – 2020-04-10 (×5): 1 [drp] via OPHTHALMIC
  Filled 2020-04-08: qty 15

## 2020-04-08 MED ORDER — GUAIFENESIN ER 600 MG PO TB12
600.0000 mg | ORAL_TABLET | Freq: Every day | ORAL | Status: DC
  Administered 2020-04-09 – 2020-04-10 (×2): 600 mg via ORAL
  Filled 2020-04-08 (×3): qty 1

## 2020-04-08 MED ORDER — TETANUS-DIPHTH-ACELL PERTUSSIS 5-2.5-18.5 LF-MCG/0.5 IM SUSY
0.5000 mL | PREFILLED_SYRINGE | Freq: Once | INTRAMUSCULAR | Status: AC
  Administered 2020-04-08: 0.5 mL via INTRAMUSCULAR
  Filled 2020-04-08: qty 0.5

## 2020-04-08 MED ORDER — ONDANSETRON HCL 4 MG/2ML IJ SOLN: 4.0000 mg | Freq: Four times a day (QID) | INTRAMUSCULAR | Status: DC | PRN

## 2020-04-08 MED ORDER — FLUTICASONE PROPIONATE 50 MCG/ACT NA SUSP
2.0000 | Freq: Every day | NASAL | Status: DC
  Administered 2020-04-09 – 2020-04-10 (×2): 2 via NASAL
  Filled 2020-04-08: qty 16

## 2020-04-08 MED ORDER — HYDRALAZINE HCL 20 MG/ML IJ SOLN: 5.0000 mg | INTRAMUSCULAR | Status: DC | PRN

## 2020-04-08 MED ORDER — LEVALBUTEROL TARTRATE 45 MCG/ACT IN AERO
2.0000 | INHALATION_SPRAY | RESPIRATORY_TRACT | Status: DC | PRN
  Administered 2020-04-09: 2 via RESPIRATORY_TRACT
  Filled 2020-04-08: qty 15

## 2020-04-08 MED ORDER — DOCUSATE SODIUM 100 MG PO CAPS
100.0000 mg | ORAL_CAPSULE | Freq: Two times a day (BID) | ORAL | Status: DC
  Administered 2020-04-08 – 2020-04-10 (×5): 100 mg via ORAL
  Filled 2020-04-08 (×5): qty 1

## 2020-04-08 MED ORDER — DILTIAZEM HCL-DEXTROSE 125-5 MG/125ML-% IV SOLN (PREMIX)
5.0000 mg/h | INTRAVENOUS | Status: DC
  Administered 2020-04-08: 5 mg/h via INTRAVENOUS
  Filled 2020-04-08: qty 125

## 2020-04-08 MED ORDER — MOMETASONE FURO-FORMOTEROL FUM 200-5 MCG/ACT IN AERO
2.0000 | INHALATION_SPRAY | Freq: Two times a day (BID) | RESPIRATORY_TRACT | Status: DC
  Administered 2020-04-09 – 2020-04-10 (×2): 2 via RESPIRATORY_TRACT
  Filled 2020-04-08: qty 8.8

## 2020-04-08 MED ORDER — FINASTERIDE 5 MG PO TABS
5.0000 mg | ORAL_TABLET | Freq: Every day | ORAL | Status: DC
  Administered 2020-04-08 – 2020-04-10 (×3): 5 mg via ORAL
  Filled 2020-04-08 (×3): qty 1

## 2020-04-08 MED ORDER — SODIUM CHLORIDE 0.9% FLUSH
3.0000 mL | Freq: Two times a day (BID) | INTRAVENOUS | Status: DC
  Administered 2020-04-08 – 2020-04-10 (×4): 3 mL via INTRAVENOUS

## 2020-04-08 MED ORDER — PREDNISONE 10 MG PO TABS
10.0000 mg | ORAL_TABLET | Freq: Every day | ORAL | Status: DC
  Administered 2020-04-09 – 2020-04-10 (×2): 10 mg via ORAL
  Filled 2020-04-08 (×2): qty 1

## 2020-04-08 MED ORDER — ACETAMINOPHEN 325 MG PO TABS
650.0000 mg | ORAL_TABLET | Freq: Four times a day (QID) | ORAL | Status: DC | PRN
  Administered 2020-04-08 – 2020-04-10 (×5): 650 mg via ORAL
  Filled 2020-04-08 (×5): qty 2

## 2020-04-08 MED ORDER — ENOXAPARIN SODIUM 30 MG/0.3ML ~~LOC~~ SOLN
30.0000 mg | Freq: Every day | SUBCUTANEOUS | Status: DC
  Administered 2020-04-08 – 2020-04-09 (×2): 30 mg via SUBCUTANEOUS
  Filled 2020-04-08 (×2): qty 0.3

## 2020-04-08 MED ORDER — AMIODARONE HCL IN DEXTROSE 360-4.14 MG/200ML-% IV SOLN
30.0000 mg/h | INTRAVENOUS | Status: DC
  Administered 2020-04-09: 30 mg/h via INTRAVENOUS
  Filled 2020-04-08: qty 200

## 2020-04-08 MED ORDER — AMIODARONE HCL IN DEXTROSE 360-4.14 MG/200ML-% IV SOLN
60.0000 mg/h | INTRAVENOUS | Status: DC
  Administered 2020-04-08 (×2): 60 mg/h via INTRAVENOUS
  Filled 2020-04-08 (×2): qty 200

## 2020-04-08 MED ORDER — POTASSIUM CHLORIDE 10 MEQ/100ML IV SOLN
10.0000 meq | Freq: Once | INTRAVENOUS | Status: AC
  Administered 2020-04-08: 10 meq via INTRAVENOUS
  Filled 2020-04-08: qty 100

## 2020-04-08 MED ORDER — HYDROCODONE-ACETAMINOPHEN 5-325 MG PO TABS
1.0000 | ORAL_TABLET | ORAL | Status: DC | PRN
  Filled 2020-04-08 (×2): qty 1

## 2020-04-08 MED ORDER — TIOTROPIUM BROMIDE MONOHYDRATE 2.5 MCG/ACT IN AERS
2.0000 | INHALATION_SPRAY | Freq: Every day | RESPIRATORY_TRACT | Status: DC
  Administered 2020-04-09: 2 via RESPIRATORY_TRACT

## 2020-04-08 MED ORDER — ASPIRIN EC 81 MG PO TBEC
81.0000 mg | DELAYED_RELEASE_TABLET | Freq: Every day | ORAL | Status: DC
  Administered 2020-04-09 – 2020-04-10 (×2): 81 mg via ORAL
  Filled 2020-04-08 (×2): qty 1

## 2020-04-08 MED ORDER — BISACODYL 5 MG PO TBEC: 5.0000 mg | DELAYED_RELEASE_TABLET | Freq: Every day | ORAL | Status: DC | PRN

## 2020-04-08 MED ORDER — ONDANSETRON HCL 4 MG PO TABS: 4.0000 mg | ORAL_TABLET | Freq: Four times a day (QID) | ORAL | Status: DC | PRN

## 2020-04-08 MED ORDER — ATORVASTATIN CALCIUM 10 MG PO TABS
20.0000 mg | ORAL_TABLET | Freq: Every day | ORAL | Status: DC
  Administered 2020-04-08 – 2020-04-09 (×2): 20 mg via ORAL
  Filled 2020-04-08 (×2): qty 2

## 2020-04-08 MED ORDER — ALFUZOSIN HCL ER 10 MG PO TB24
10.0000 mg | ORAL_TABLET | Freq: Every day | ORAL | Status: DC
  Administered 2020-04-09 – 2020-04-10 (×2): 10 mg via ORAL
  Filled 2020-04-08 (×3): qty 1

## 2020-04-08 MED ORDER — LIDOCAINE-EPINEPHRINE 1 %-1:100000 IJ SOLN
20.0000 mL | Freq: Once | INTRAMUSCULAR | Status: AC
  Administered 2020-04-08: 20 mL
  Filled 2020-04-08: qty 1

## 2020-04-08 MED ORDER — SODIUM CHLORIDE 0.9 % IV BOLUS
500.0000 mL | Freq: Once | INTRAVENOUS | Status: AC
  Administered 2020-04-08: 500 mL via INTRAVENOUS

## 2020-04-08 MED ORDER — POLYETHYLENE GLYCOL 3350 17 G PO PACK: 17.0000 g | PACK | Freq: Every day | ORAL | Status: DC | PRN

## 2020-04-08 MED ORDER — ALPRAZOLAM 0.5 MG PO TABS
0.5000 mg | ORAL_TABLET | Freq: Three times a day (TID) | ORAL | Status: DC | PRN
  Administered 2020-04-08 – 2020-04-10 (×5): 0.5 mg via ORAL
  Filled 2020-04-08 (×6): qty 1

## 2020-04-08 MED ORDER — CHLORHEXIDINE GLUCONATE CLOTH 2 % EX PADS
6.0000 | MEDICATED_PAD | Freq: Every day | CUTANEOUS | Status: DC
  Administered 2020-04-09: 6 via TOPICAL

## 2020-04-08 MED ORDER — PANTOPRAZOLE SODIUM 40 MG PO TBEC
40.0000 mg | DELAYED_RELEASE_TABLET | Freq: Every day | ORAL | Status: DC
  Administered 2020-04-09 – 2020-04-10 (×2): 40 mg via ORAL
  Filled 2020-04-08 (×2): qty 1

## 2020-04-08 NOTE — ED Provider Notes (Signed)
LACERATION REPAIR Performed by: Alfredia Client Authorized by: Alfredia Client Consent: Verbal consent obtained. Risks and benefits: risks, benefits and alternatives were discussed Consent given by: patient Patient identity confirmed: provided demographic data Prepped and Draped in normal sterile fashion Wound explored  Laceration Location:left lower leg   Laceration Length: 8cm, V shaped   No Foreign Bodies seen or palpated  Anesthesia: local infiltration  Local anesthetic: lidocaine 1% w epinephrine  Anesthetic total: 6 ml  Irrigation method: syringe Amount of cleaning: standard  Skin closure: sutures 5 prolene   Number of sutures: 14  Technique: simple interrupted, flap closure   Patient tolerance: Patient tolerated the procedure well with no immediate complications.    Alfredia Client, PA-C 04/08/20 5110    Davonna Belling, MD 04/08/20 [phone]

## 2020-04-08 NOTE — H&P (Addendum)
History and Physical    [name] UXL:[id number] DOB: [birth year] DOA: 04/08/2020  PCP: Travis Cruel, MD Consultants:  [name] - urology; palliative care; [name] - wound care; [facility] - cardiology; [name]; [name] Patient coming from:  Home - lives with wife; NOK: Wife, [name], [phone]   Chief Complaint: [name]  HPI: [name] is a 87 y.o. male with medical history significant of PVD s/p R AKA; COPD on home O2; afib; CAD s/p CABG; HTN; and HLD presenting with a fall.  He had a bladder surgery - bladder tumor resection on Tuesday.  He was discharged that night, bloody urine resolved the following day.  Last night, he went to bed as usual.  He awoke overnight and he forgot he had an amputation and fell stump revision last mo; had afib at that timenth).  He had also had a long-standing foley and was having overflow and so tumor was removed.  He had a lower leg laceration requiring 14 stitches.  Now he is having afib.  He is not currently having CP but was having it this AM.  He takes NTG 1-2 times a month and did not need it.  He has chronic SOB and uses his breathing medications about now.  He was placed on Keflex post-operatively but he is "allergic" to it and so did not start it due to h/o itching, rash, diarrhea.  Has has done well with Amoxil, doxycycline.    ED Course:  Stepped out of bed and fell - lac repaired.  Recent bladder surgery.  Hgb 8.7.  In aflutter with RVR, not on AC.  Started on low-dose cardizem drip and will need serial Hgb.  Cardiology has been consulted.  Review of Systems: As per HPI; otherwise review of systems reviewed and negative.   Ambulatory Status:  S/p AKA  COVID Vaccine Status:  Complete plus booster  Past Medical History:  Diagnosis Date  . Anxiety   . Arthritis    "generalized" (04/04/2017)  . Asthma   . Basal cell carcinoma    "left ear"  . BPH (benign prostatic hyperplasia)    severe; s/p multiple biopsies  .  CAD (coronary artery disease)   . Carotid artery occlusion   . Chronic pain 04/08/2020  . Chronic respiratory failure ([name])   . Chronic rhinitis   . COPD (chronic obstructive pulmonary disease) (HCC)    2L [place] O2  . Depression   . Diastolic heart failure ([place]) 2019  . Dilation of biliary tract   . Elevated troponin 03/09/2017  . Gallstones   . GERD (gastroesophageal reflux disease)   . History of blood transfusion    "w/his CABG" (04/04/2017)  . History of kidney stones   . Hyperlipidemia   . Hypertension   . On home oxygen therapy    "~ 24/7" (04/04/2017)  . Peripheral [name] disease ([place])   . Pneumonia 2019  . Squamous cell carcinoma of skin 04/23/2013   in situ-Right flank (txpbx)  . Squamous cell carcinoma of skin 01/23/2017   [place]/ in siut- Left ear rim (CX3FU)  . Syncope and collapse   . Ureteral tumor 08/2015   had endoscopic procedure for evaluation, unable to reach for biopsy  . Urinary catheter (Foley) change required     Past Surgical History:  Procedure Laterality Date  . ABDOMINAL AORTOGRAM W/LOWER EXTREMITY Bilateral 06/30/2019   Procedure: ABDOMINAL AORTOGRAM W/LOWER EXTREMITY;  Surgeon: Travis Sandy, MD;  Location: [place] [facility];  Service: Cardiovascular;  Laterality: Bilateral;  . AMPUTATION Right 08/11/2019   Procedure: RIGHT ABOVE KNEE AMPUTATION;  Surgeon: Travis Mould, MD;  Location: [place];  Service: [name];  Laterality: Right;  . AMPUTATION Right 01/27/2020   Procedure: RIGHT AMPUTATION ABOVE KNEE REVISION;  Surgeon: Travis Sandy, MD;  Location: [place];  Service: [name];  Laterality: Right;  . BASAL CELL CARCINOMA EXCISION Left    ear  . CARDIAC CATHETERIZATION     "prior to bypass"  . CATARACT EXTRACTION W/ INTRAOCULAR LENS  IMPLANT, BILATERAL Bilateral   . CORONARY ARTERY BYPASS GRAFT  10/2001   LIMA to LAD, SVG to OM1-2, SVG to RCA and PDA  . LITHOTRIPSY    . PROSTATE BIOPSY  10/2012   had bleeding after  surgery.  . TONSILLECTOMY    . TRANSURETHRAL RESECTION OF BLADDER TUMOR N/A 04/06/2020   Procedure: TRANSURETHRAL RESECTION OF BLADDER TUMOR (TURBT)/ POST OPERATIVE INSTILLATION OF GEMCITABINE;  Surgeon: Travis Aloe, MD;  Location: [facility];  Service: Urology;  Laterality: N/A;    Social History   Socioeconomic History  . Marital status: Married    Spouse name: [name]  . Number of children: 2  . Years of education: 21  . Highest education level: 12th grade  Occupational History  . 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 Smoking status: Former Smoker    Packs/day: 2.00    Years: 33.00    Pack years: 66.00    Types: Cigarettes    Quit date: 03/25/1982    Years since quitting: 38.0  . Smokeless tobacco: Never Used  Vaping Use  . Vaping Use: Never used  Substance and Sexual Activity  . Alcohol use: Yes    Alcohol/week: 0.0 standard drinks    Comment: 1.5-3 oz daily in the past, minimal use in the last few months  . Drug use: No  . Sexual activity: Not Currently  Other Topics Concern  . Not on file  Social History Narrative   ** Merged History Encounter **       Pt lives at home with his spouse.         Social Determinants of Health   Financial Resource Strain: Low Risk   . Difficulty of Paying Living Expenses: Not hard at all  Food Insecurity: No Food Insecurity  . Worried About Charity fundraiser in the Last Year: Never true  . Ran Out of Food in the Last Year: Never true  Transportation Needs: No Transportation Needs  . Lack of Transportation (Medical): No  . Lack of Transportation (Non-Medical): No  Physical Activity: Inactive  . Days of Exercise per Week: 0 days  . Minutes of Exercise per Session: 0 min  Stress: No Stress Concern Present  . Feeling of Stress : Only a little  Social Connections: Moderately Integrated  . Frequency of Communication with Friends and Family: More than three times a week   . Frequency of Social Gatherings with Friends and Family: More than three times a week  . Attends Religious Services: More than 4 times per year  . Active Member of Clubs or Organizations: No  . Attends Archivist Meetings: Never  . Marital Status: Married  Human resources officer Violence: Not At Risk  . Fear of Current or Ex-Partner: No  . Emotionally Abused: No  . Physically Abused: No  . Sexually Abused: No    Allergies  Allergen Reactions  . Cefdinir  Diarrhea and Other (See Comments)    Severe Diarrhea  . Nitrofurantoin Swelling and Other (See Comments)    Hand Swelling  . Sulfa Antibiotics Anaphylaxis and Swelling  . Sulfonamide Derivatives Swelling and Other (See Comments)    Facial/tongue swelling  . Tape Other (See Comments)    SKIN IS VERY THIN AND TEARS EASILY!!!!! Please do NOT use "plastic" tape- USE PAPER!!  . Ciprofloxacin Itching, Rash and Other (See Comments)    Red itchy hands  . Nitrofurantoin Swelling and Other (See Comments)    Swollen hands  . Amoxicillin Er Other (See Comments)    Frequest urination. Pt reports "not really" allergic 01/24/20  . Doxycycline Other (See Comments)    "Felt terrible" Pt wife reports "not really" allergic 01/24/20  . Keflex [Cephalexin] Other (See Comments)    Pt does not recall reaction   . Mirtazapine Other (See Comments)  . Other Other (See Comments)  . Ranolazine Other (See Comments)    Inability to ambulate   . Chlorhexidine Gluconate Rash  . Levaquin [Levofloxacin] Itching and Rash  . Sertraline Anxiety and Other (See Comments)    Makes the patient jittery    Family History  Problem Relation Age of Onset  . Heart disease Mother   . Heart disease Father        Before age 41  . Breast cancer Mother   . Cancer Mother        Breast cancer  . Hypertension Mother   . Other Mother        AAA  and   Amputation  . Heart attack Father   . Stroke Brother        x3, still living   . Peripheral [name] disease  Brother   . Heart disease Brother   . Hyperlipidemia Brother   . Hypertension Brother   . Heart disease Brother   . Allergies Brother     Prior to Admission medications   Medication Sig Start Date End Date Taking? Authorizing Provider  acetaminophen (TYLENOL) 500 MG tablet Take 500 mg by mouth every 6 (six) hours as needed for moderate pain.    [provider]  alfuzosin (UROXATRAL) 10 MG 24 hr tablet Take 1 tablet (10 mg total) by mouth daily with breakfast. 04/06/20   Travis Aloe, MD  ALPRAZolam Duanne Moron) 0.5 MG tablet Take 0.5 mg by mouth 3 (three) times daily as needed for anxiety. 03/05/20   [provider]  aspirin 81 MG tablet Take 81 mg by mouth daily.    [provider]  atorvastatin (LIPITOR) 40 MG tablet Take 1 tablet (40 mg total) by mouth at bedtime. 02/16/20   Angiulli, Lavon Paganini, PA-C  calcium carbonate (TUMS - DOSED IN MG ELEMENTAL CALCIUM) 500 MG chewable tablet Chew 1 tablet (200 mg of elemental calcium total) by mouth 2 (two) times daily as needed for indigestion or heartburn. 09/01/19   Love, Ivan Anchors, PA-C  cephALEXin (KEFLEX) 500 MG capsule Take 1 capsule (500 mg total) by mouth at bedtime. 04/06/20   Travis Aloe, MD  Cholecalciferol (VITAMIN D3) 50 MCG (2000 UT) TABS Take 2,000 Units by mouth daily with lunch. 02/16/20   Angiulli, Lavon Paganini, PA-C  famotidine (PEPCID) 20 MG tablet Take 20 mg by mouth at bedtime as needed for heartburn or indigestion.    [provider]  finasteride (PROSCAR) 5 MG tablet Take 1 tablet (5 mg total) by mouth daily with lunch. 02/16/20   Angiulli, Lavon Paganini, PA-C  fluticasone (FLONASE) 50 MCG/ACT nasal spray Place 2 sprays into both nostrils daily.    [provider]  furosemide (LASIX) 40 MG tablet Take 1 tablet (40 mg total) by mouth daily. May take up to 80 mg as needed. 03/01/20   Burtis Junes, NP  guaiFENesin (MUCINEX) 600 MG 12 hr tablet Take 1 tablet (600 mg total) by mouth 2 (two) times  daily. Patient taking differently: Take 600 mg by mouth daily. 02/16/20   Angiulli, Lavon Paganini, PA-C  HYDROcodone-acetaminophen (NORCO/VICODIN) 5-325 MG tablet Take 1 tablet by mouth every 4 (four) hours as needed for moderate pain. 02/16/20   Angiulli, Lavon Paganini, PA-C  levalbuterol [facility] HFA) 45 MCG/ACT inhaler Inhale 2 puffs into the lungs every 4 (four) hours as needed for wheezing or shortness of breath. 02/16/20   Angiulli, Lavon Paganini, PA-C  levalbuterol (XOPENEX) 0.63 MG/3ML nebulizer solution Take 3 mLs (0.63 mg total) by nebulization every 4 (four) hours as needed for wheezing or shortness of breath. 10/13/19   Tanda Rockers, MD  metoprolol succinate (TOPROL-XL) 25 MG 24 hr tablet Take 1 tablet (25 mg total) by mouth daily. 02/16/20   Angiulli, Lavon Paganini, PA-C  Multiple Vitamins-Minerals (PRESERVISION AREDS 2) CAPS Take 1 capsule by mouth daily.    [provider]  NITROSTAT 0.4 MG SL tablet Place 0.4 mg under the tongue every 5 (five) minutes as needed for chest pain.    [provider]  OXYGEN Inhale 2 L/min into the lungs continuous.     [provider]  pantoprazole (PROTONIX) 40 MG tablet TAKE 1 TABLET(40 MG) BY MOUTH DAILY 30 TO 60 MINUTES BEFORE FIRST MEAL OF THE DAY Patient taking differently: Take 40 mg by mouth daily before breakfast. 02/16/20   Angiulli, Lavon Paganini, PA-C  Polyethyl Glycol-Propyl Glycol (SYSTANE) 0.4-0.3 % SOLN Apply 1 drop to eye 2 (two) times daily as needed (eye irritation). Patient taking differently: Apply 1 drop to eye 3 (three) times daily as needed (eye irritation). 09/01/19   Love, Ivan Anchors, PA-C  polyethylene glycol (MIRALAX / GLYCOLAX) 17 g packet Take 17 g by mouth daily. Patient taking differently: Take 17 g by mouth daily as needed for moderate constipation. 09/01/19   Love, Ivan Anchors, PA-C  predniSONE (DELTASONE) 10 MG tablet Take 1 tablet (10 mg total) by mouth daily. 02/16/20   Angiulli, Lavon Paganini, PA-C  sertraline (ZOLOFT) 25 MG tablet Take  25 mg by mouth daily. 02/25/20   [provider]  sodium chloride (MURO 128) 5 % ophthalmic solution Place 1 drop into both eyes 4 (four) times daily. 08/22/19   Love, Ivan Anchors, PA-C  SYMBICORT 160-4.5 MCG/ACT inhaler Inhale 2 puffs into the lungs 2 (two) times daily. 02/16/20   Angiulli, Lavon Paganini, PA-C  Tiotropium Bromide Monohydrate (SPIRIVA RESPIMAT) 2.5 MCG/ACT AERS INHALE 2 PUFFS BY MOUTH EVERY DAY Patient taking differently: Inhale 2 puffs into the lungs daily. 05/14/19   Tanda Rockers, MD  vitamin B-12 (CYANOCOBALAMIN) 1000 MCG tablet Take 1 tablet (1,000 mcg total) by mouth daily. 02/16/20   Cathlyn Parsons, PA-C    Physical Exam: Vitals:   04/08/20 0930 04/08/20 1115 04/08/20 1230 04/08/20 1454  BP: 103/64 91/64 96/64  (!) 94/55  Pulse: (!) 132 (!) 131 67 74  Resp: (!) 24 18 20 18   Temp:    98.1 F (36.7 C)  TempSrc:    Oral  SpO2: 100% 100% 100% 100%  Weight:    55.3 kg  Height:  5\' 9"  (1.753 m)     . General:  Appears calm and comfortable and is in NAD, on [place] O2 . Eyes:  PERRL, EOMI, normal lids, iris . ENT:  Very hard of hearing, grossly normal lips & tongue, mmm . Neck:  no LAD, masses or thyromegaly . Cardiovascular:  Irregularly irregular, no m/r/g. 2-3+ LLE edema.  Marland Kitchen Respiratory:   CTA bilaterally with no wheezes/rales/rhonchi.  Mildly increased respiratory effort. . Abdomen:  soft, NT, ND . Skin:  S/p repair of shark bite-appearing medial left lower leg laceration     . Musculoskeletal:  R AKA stump appears to be C/D/I; there was one residual staple apparently inadvertently left in place and so this was removed without difficulty . Left Lower extremity:  2-3+ LE edema.  Limited foot exam with no ulcerations.  1+ distal pulses. Marland Kitchen Psychiatric:  blunted mood and affect, speech fluent and appropriate, AOx3 . Neurologic:  CN 2-12 grossly intact, moves all extremities in coordinated fashion    Radiological Exams on Admission: Independently reviewed - see  discussion in A/P where applicable  DG Tibia/Fibula Left  Result Date: 04/08/2020 CLINICAL DATA:  Fall.  Laceration.  Pain. EXAM: LEFT TIBIA AND FIBULA - 2 VIEW COMPARISON:  No recent. FINDINGS: Soft tissue bandage noted over the left lower extremity. Small amount of soft tissue air may be present consistent with the patient's history. No radiopaque foreign body. Soft tissue swelling. No acute bony or joint abnormality identified. No evidence of fracture or dislocation. Peripheral [name] calcification. IMPRESSION: 1. Soft tissue bandage noted. Small amount of soft tissue air may be present consistent with the patient's history. No radiopaque foreign body noted. Soft tissue swelling. No acute bony or joint abnormality. 2.  Peripheral [name] disease. Electronically Signed   By: Marcello Moores  Register   On: 04/08/2020 07:41    EKG: Independently reviewed.   0628 - Aflutter with rate 128; RBBB 0801 - NSR with rate 75; RBBB not present, no obvious ischemia  Labs on Admission: I have personally reviewed the available labs and imaging studies at the time of the admission.  Pertinent labs:   K+ 3.2 CO2 34 BUN 25/Creatinine 1.38/GFR 49 WBC 7.3 Hgb 8.7   Assessment/Plan Principal Problem:   Fall at home, initial encounter Active Problems:   Hyperlipidemia   Essential hypertension   COPD   GOLD IV/ 02/steroid dep    Anxiety disorder   DNR (do not resuscitate) discussion   Urinary tract obstruction   Atrial fibrillation with RVR (HCC)   Chronic pain    Fall -Patient reportedly forgot that he had had an amputation and rose from the bed quickly, causing him to fall -No apparent syncope or head trauma -Despite afib, he is not on AC -LLE laceration s/p repair -Mild anemia but this appears to be close to his baseline (from 1/27 to present, he has had 8 CBCs and Hgb has ranged from 8.5-10.1) -Will recheck CBC at 1700 and 0500 but low suspicion for need for further intervention currently  Afib  with RVR -He has chronic and known afib -Today with RVR, which is what led to need for overnight observation -He was started on Diltiazem drip; will transition to PO as per protocol when appropriate -Cardiology consult requested by EDP -Continue ASA -Hold Lasix as well as IVF for now  BPH with urologic tumor -Has significantly enlarged prostate, s/p TURBT on 3/29 -He was discharged on Keflex but did not fill due to allergy -I was in contact  with Dr. Junious [name] and he agrees with doxy instead (wife reports that he is able to take this) -Will leave foley in place until next Wednesday and then can pull and do voiding trial -Continue Uroxatrol, Proscar -Pathology from 3/29 procedure is positive for high-grade papillary urothelial carcinoma with at least focal invasion; will need further urology f/u  HLD -Continue Lipitor 20 mg qhs  HTN -Hold Toprol XL in the setting of Diltiazem drip and currently low BP  Chronic respiratory failure -Appears to be stable but chronically worsening over time -Followed by pulm -Continue home meds (Mucinex, Flonase, Xopenex, daily prednisone 10 mg, Symbicort with Dulera formulary substitution, Spiriva) -Continue [place] O2  GERD -Continue Protonix daily  Anxiety -Acknowledges anxiety and does appear to be mildly anxious -Will continue home Xanax, Zoloft  Chronic pain -I have reviewed this patient in the [place] Controlled Substances Reporting System.  He is receiving medications from only one provider and appears to be taking them as prescribed. -He is not at particularly high risk of opioid misuse, diversion, or overdose. -Continue Vicodin     Note: This patient has been tested and is negative for the novel coronavirus COVID-19. The patient has been fully vaccinated against COVID-19.   Level of care: ProgressiveProgressive DVT prophylaxis:   Lovenox  Code Status: DNR - confirmed with patient/family Family Communication: Wife was present  throughout evaluation. Disposition Plan:  The patient is from: home  Anticipated d/c is to: home without [facility] services  Anticipated d/c date will depend on clinical response to treatment, but possibly as early as tomorrow if he has excellent response to treatment  Patient is currently: acutely ill Consults called: Urology by Secure Chat only; cardiology  Admission status:  It is my clinical opinion that referral for OBSERVATION is reasonable and necessary in this patient based on the above information provided. The aforementioned taken together are felt to place the patient at high risk for further clinical deterioration. However it is anticipated that the patient may be medically stable for discharge from the hospital within 24 to 48 hours.    Karmen Bongo MD [facility]   How to contact the [facility] Attending or Consulting provider [place] or covering provider during after hours [place], for this patient?  1. Check the care team in [facility] and look for a) attending/consulting [facility] provider listed and b) the [facility] team listed 2. Log into [email or web address] and use [place]'s universal password to access. If you do not have the password, please contact the hospital operator. 3. Locate the [facility] provider you are looking for under [facility] and page to a number that you can be directly reached. 4. If you still have difficulty reaching the provider, please page the [facility] (Director on Call) for the Hospitalists listed on amion for assistance.   04/08/2020, 3:03 PM

## 2020-04-08 NOTE — ED Provider Notes (Signed)
[facility] EMERGENCY DEPARTMENT Provider Note   CSN: [id number] Arrival date & time: 04/08/20  8563     History Chief Complaint  Patient presents with  . Leg Injury  . Atrial Fibrillation    [name] is a 87 y.o. male.  HPI Patient presents after a fall.  He got out of bed forgetting that he did not have a right lower leg.  Laceration to left lower leg.  Denies hitting his head.  Not on anticoagulation.  Reportedly had large amount of blood on scene.  However found to be tachycardic and somewhat hypotensive for EMS.  In atrial flutter.  Does have history of atrial flutter.  Upon my seeing him in the room patient had converted to a sinus rhythm.  No fevers or chills.  No cough.  Denies headache or confusion.  Is hard of hearing and patient's wife does help somewhat with this.  Patient has a Foley catheter in place from recent bladder surgery.    Past Medical History:  Diagnosis Date  . Anxiety   . Arthritis    "generalized" (04/04/2017)  . Asthma   . Basal cell carcinoma    "left ear"  . BPH (benign prostatic hyperplasia)    severe; s/p multiple biopsies  . CAD (coronary artery disease)   . Carotid artery occlusion   . Chronic respiratory failure ([place])   . Chronic rhinitis   . COPD (chronic obstructive pulmonary disease) (HCC)    2L [place] O2  . Depression   . Diastolic heart failure ([place]) 2019  . Dilation of biliary tract   . Elevated troponin 03/09/2017  . Gallstones   . GERD (gastroesophageal reflux disease)   . History of blood transfusion    "w/his CABG" (04/04/2017)  . History of kidney stones   . Hyperlipidemia   . Hypertension   . On home oxygen therapy    "~ 24/7" (04/04/2017)  . Peripheral vascular disease ([place])   . Pneumonia 2019  . Squamous cell carcinoma of skin 04/23/2013   in situ-Right flank (txpbx)  . Squamous cell carcinoma of skin 01/23/2017   [place]/ in siut- Left ear rim (CX3FU)  . Syncope and collapse   . Ureteral tumor  08/2015   had endoscopic procedure for evaluation, unable to reach for biopsy  . Urinary catheter (Foley) change required     Patient Active Problem List   Diagnosis Date Noted  . [place] 02/27/2020  . Chronic peripheral venous hypertension with lower extremity complication 14/97/0263  . Complete traumatic amputation at level between right hip and knee, initial encounter ([place]) 02/27/2020  . Difficulty in walking, not elsewhere classified 02/27/2020  . Dyspepsia 02/27/2020  . Foot ulcer ([place]) 02/27/2020  . Hyperglycemia 02/27/2020  . Iron deficiency anemia 02/27/2020  . Major depression single episode, in partial remission ([place]) 02/27/2020  . Malnutrition related to chronic disease ([place]) 02/27/2020  . Osteomyelitis ([place]) 02/27/2020  . Paresthesia 02/27/2020  . Right upper quadrant pain 02/27/2020  . Slow transit constipation 02/27/2020  . Spasm of bladder 02/27/2020  . Urinary tract obstruction 02/27/2020  . Vitamin D deficiency 02/27/2020  . Congestive heart failure ([place]) 02/27/2020  . Prediabetes   . Urinary retention   . Normocytic anemia   . Chronic diastolic congestive heart failure ([place])   . Major depressive disorder with current active episode   . Paroxysmal atrial fibrillation (HCC)   . Right above-knee amputee ([place]) 02/05/2020  . Typical atrial flutter ([place])   .  New onset atrial fibrillation ([place])   . Malnutrition of moderate degree 01/30/2020  . Cellulitis of left foot 01/24/2020  . Cellulitis and abscess of left lower extremity 01/24/2020  . Abnormality of gait 10/20/2019  . Pain of left heel 09/01/2019  . Labile blood pressure   . Anxiety state   . Hypothyroidism   . Right hip pain   . Sacral decubitus ulcer   . Post-operative pain   . Chronic systolic congestive heart failure ([place])   . Urinary retention due to benign prostatic hyperplasia 08/20/2019  . SOB (shortness of breath)   . Chronic systolic CHF (congestive heart failure) ([place])   . Hypoalbuminemia  due to protein-calorie malnutrition ([place])   . Transaminitis   . Acute blood loss anemia   . Hyponatremia   . Postoperative pain   . Supplemental oxygen dependent   . S/P AKA (above knee amputation) ([place]) 08/14/2019  . Peripheral arterial disease ([place]) 08/11/2019  . Abnormal urinalysis 08/11/2019  . Critical lower limb ischemia ([place]) 08/11/2019  . DNR (do not resuscitate)   . Voiding difficulty   . Palliative care by specialist   . Pressure injury of skin 06/29/2019  . Cellulitis of left lower extremity 06/28/2019  . Cellulitis of right lower extremity 06/27/2019  . AKI (acute kidney injury) ([place]) 06/27/2019  . Headache 10/16/2018  . Severe major depression without psychotic features ([place]) 09/22/2017  . Generalized anxiety disorder 09/22/2017  . DNR (do not resuscitate) discussion 05/10/2017  . Weakness generalized 04/04/2017  . Chest pain 03/24/2017  . AV block, [place] 1 03/24/2017  . CAD (coronary artery disease) 03/24/2017  . Chronic diastolic CHF (congestive heart failure) ([place]) 03/24/2017  . COPD (chronic obstructive pulmonary disease) ([place]) 03/24/2017  . NSTEMI (non-ST elevated myocardial infarction) ([place])   . Elevated troponin 03/09/2017  . Acute on chronic diastolic heart failure ([place])   . Tachycardia 03/08/2017  . Influenza A 02/04/2017  . Cor pulmonale, chronic (HCC)/ clinical dx  09/13/2016  . Orthostatic hypotension 09/26/2015  . Angina at rest [facility]) 09/24/2015  . BPH (benign prostatic hyperplasia) 09/13/2015  . GERD (gastroesophageal reflux disease) 09/13/2015  . Anxiety 09/13/2015  . Sinusitis, chronic 09/09/2015  . DOE (dyspnea on exertion) 04/15/2015  . Acute heart failure ([place]) 03/18/2015  . Right calf pain 06/18/2014  . Coronary artery disease involving coronary bypass graft of native heart with angina pectoris ([place])   . Peripheral vascular disease ([place])   . Essential hypertension   . Obstructive sleep apnea   . Rhinitis, chronic 01/29/2007  . COPD    GOLD IV/ 02/steroid dep  01/29/2007  . Chronic respiratory failure with hypoxia and hypercapnia ([place]) 01/29/2007  . Hyperlipidemia     Past Surgical History:  Procedure Laterality Date  . ABDOMINAL AORTOGRAM W/LOWER EXTREMITY Bilateral 06/30/2019   Procedure: ABDOMINAL AORTOGRAM W/LOWER EXTREMITY;  Surgeon: Waynetta Sandy, MD;  Location: [facility];  Service: Cardiovascular;  Laterality: Bilateral;  . AMPUTATION Right 08/11/2019   Procedure: RIGHT ABOVE KNEE AMPUTATION;  Surgeon: Angelia Mould, MD;  Location: [facility];  Service: Vascular;  Laterality: Right;  . AMPUTATION Right 01/27/2020   Procedure: RIGHT AMPUTATION ABOVE KNEE REVISION;  Surgeon: Waynetta Sandy, MD;  Location: [facility];  Service: Vascular;  Laterality: Right;  . BASAL CELL CARCINOMA EXCISION Left    ear  . CARDIAC CATHETERIZATION     "prior to bypass"  . CATARACT EXTRACTION W/ INTRAOCULAR LENS  IMPLANT, BILATERAL Bilateral   . CORONARY ARTERY  BYPASS GRAFT  10/2001   LIMA to LAD, SVG to OM1-2, SVG to RCA and PDA  . LITHOTRIPSY    . PROSTATE BIOPSY  10/2012   had bleeding after surgery.  . TONSILLECTOMY    . TRANSURETHRAL RESECTION OF BLADDER TUMOR N/A 04/06/2020   Procedure: TRANSURETHRAL RESECTION OF BLADDER TUMOR (TURBT)/ POST OPERATIVE INSTILLATION OF GEMCITABINE;  Surgeon: Festus Aloe, MD;  Location: [facility];  Service: Urology;  Laterality: N/A;       Family History  Problem Relation Age of Onset  . Heart disease Mother   . Heart disease Father        Before age 25  . Breast cancer Mother   . Cancer Mother        Breast cancer  . Hypertension Mother   . Other Mother        AAA  and   Amputation  . Heart attack Father   . Stroke Brother        x3, still living   . Peripheral vascular disease Brother   . Heart disease Brother   . Hyperlipidemia Brother   . Hypertension Brother   . Heart disease Brother   . Allergies Brother     Social History   Tobacco Use  .  Smoking status: Former Smoker    Packs/day: 2.00    Years: 33.00    Pack years: 66.00    Types: Cigarettes    Quit date: 03/25/1982    Years since quitting: 38.0  . Smokeless tobacco: Never Used  Vaping Use  . Vaping Use: Never used  Substance Use Topics  . Alcohol use: Yes    Alcohol/week: 0.0 standard drinks    Comment: 1.5-3 oz daily in the past, minimal use in the last few months  . Drug use: No    Home Medications Prior to Admission medications   Medication Sig Start Date End Date Taking? Authorizing Provider  acetaminophen (TYLENOL) 500 MG tablet Take 500 mg by mouth every 6 (six) hours as needed for moderate pain.    [provider]  alfuzosin (UROXATRAL) 10 MG 24 hr tablet Take 1 tablet (10 mg total) by mouth daily with breakfast. 04/06/20   Festus Aloe, MD  ALPRAZolam Duanne Moron) 0.5 MG tablet Take 0.5 mg by mouth 3 (three) times daily as needed for anxiety. 03/05/20   [provider]  aspirin 81 MG tablet Take 81 mg by mouth daily.    [provider]  atorvastatin (LIPITOR) 40 MG tablet Take 1 tablet (40 mg total) by mouth at bedtime. 02/16/20   Angiulli, Lavon Paganini, PA-C  calcium carbonate (TUMS - DOSED IN MG ELEMENTAL CALCIUM) 500 MG chewable tablet Chew 1 tablet (200 mg of elemental calcium total) by mouth 2 (two) times daily as needed for indigestion or heartburn. 09/01/19   Love, Ivan Anchors, PA-C  cephALEXin (KEFLEX) 500 MG capsule Take 1 capsule (500 mg total) by mouth at bedtime. 04/06/20   Festus Aloe, MD  Cholecalciferol (VITAMIN D3) 50 MCG (2000 UT) TABS Take 2,000 Units by mouth daily with lunch. 02/16/20   Angiulli, Lavon Paganini, PA-C  famotidine (PEPCID) 20 MG tablet Take 20 mg by mouth at bedtime as needed for heartburn or indigestion.    [provider]  finasteride (PROSCAR) 5 MG tablet Take 1 tablet (5 mg total) by mouth daily with lunch. 02/16/20   Angiulli, Lavon Paganini, PA-C  fluticasone (FLONASE) 50 MCG/ACT nasal spray Place 2 sprays  into both nostrils daily.  [provider]  furosemide (LASIX) 40 MG tablet Take 1 tablet (40 mg total) by mouth daily. May take up to 80 mg as needed. 03/01/20   Burtis Junes, NP  guaiFENesin (MUCINEX) 600 MG 12 hr tablet Take 1 tablet (600 mg total) by mouth 2 (two) times daily. Patient taking differently: Take 600 mg by mouth daily. 02/16/20   Angiulli, Lavon Paganini, PA-C  HYDROcodone-acetaminophen (NORCO/VICODIN) 5-325 MG tablet Take 1 tablet by mouth every 4 (four) hours as needed for moderate pain. 02/16/20   Angiulli, Lavon Paganini, PA-C  levalbuterol [facility] HFA) 45 MCG/ACT inhaler Inhale 2 puffs into the lungs every 4 (four) hours as needed for wheezing or shortness of breath. 02/16/20   Angiulli, Lavon Paganini, PA-C  levalbuterol (XOPENEX) 0.63 MG/3ML nebulizer solution Take 3 mLs (0.63 mg total) by nebulization every 4 (four) hours as needed for wheezing or shortness of breath. 10/13/19   Tanda Rockers, MD  metoprolol succinate (TOPROL-XL) 25 MG 24 hr tablet Take 1 tablet (25 mg total) by mouth daily. 02/16/20   Angiulli, Lavon Paganini, PA-C  Multiple Vitamins-Minerals (PRESERVISION AREDS 2) CAPS Take 1 capsule by mouth daily.    [provider]  NITROSTAT 0.4 MG SL tablet Place 0.4 mg under the tongue every 5 (five) minutes as needed for chest pain.    [provider]  OXYGEN Inhale 2 L/min into the lungs continuous.     [provider]  pantoprazole (PROTONIX) 40 MG tablet TAKE 1 TABLET(40 MG) BY MOUTH DAILY 30 TO 60 MINUTES BEFORE FIRST MEAL OF THE DAY Patient taking differently: Take 40 mg by mouth daily before breakfast. 02/16/20   Angiulli, Lavon Paganini, PA-C  Polyethyl Glycol-Propyl Glycol (SYSTANE) 0.4-0.3 % SOLN Apply 1 drop to eye 2 (two) times daily as needed (eye irritation). Patient taking differently: Apply 1 drop to eye 3 (three) times daily as needed (eye irritation). 09/01/19   Love, Ivan Anchors, PA-C  polyethylene glycol (MIRALAX / GLYCOLAX) 17 g packet Take 17 g by  mouth daily. Patient taking differently: Take 17 g by mouth daily as needed for moderate constipation. 09/01/19   Love, Ivan Anchors, PA-C  predniSONE (DELTASONE) 10 MG tablet Take 1 tablet (10 mg total) by mouth daily. 02/16/20   Angiulli, Lavon Paganini, PA-C  sertraline (ZOLOFT) 25 MG tablet Take 25 mg by mouth daily. 02/25/20   [provider]  sodium chloride (MURO 128) 5 % ophthalmic solution Place 1 drop into both eyes 4 (four) times daily. 08/22/19   Love, Ivan Anchors, PA-C  SYMBICORT 160-4.5 MCG/ACT inhaler Inhale 2 puffs into the lungs 2 (two) times daily. 02/16/20   Angiulli, Lavon Paganini, PA-C  Tiotropium Bromide Monohydrate (SPIRIVA RESPIMAT) 2.5 MCG/ACT AERS INHALE 2 PUFFS BY MOUTH EVERY DAY Patient taking differently: Inhale 2 puffs into the lungs daily. 05/14/19   Tanda Rockers, MD  vitamin B-12 (CYANOCOBALAMIN) 1000 MCG tablet Take 1 tablet (1,000 mcg total) by mouth daily. 02/16/20   Angiulli, Lavon Paganini, PA-C    Allergies    Cefdinir, Nitrofurantoin, Sulfa antibiotics, Sulfonamide derivatives, Tape, Ciprofloxacin, Nitrofurantoin, Amoxicillin er, Doxycycline, Mirtazapine, Other, Ranolazine, Chlorhexidine gluconate, Levaquin [levofloxacin], and Sertraline  Review of Systems   Review of Systems  Constitutional: Positive for fatigue. Negative for appetite change and fever.  Respiratory: Negative for shortness of breath.   Cardiovascular: Negative for chest pain.  Gastrointestinal: Negative for abdominal pain.  Genitourinary: Negative for flank pain.  Skin: Positive for wound.  Neurological: Negative for weakness.  Psychiatric/Behavioral: Negative for confusion.    Physical Exam Updated Vital Signs BP 124/79   Pulse 77   Temp 97.9 F (36.6 C)   Resp 13   Ht 5\' 9"  (1.753 m)   Wt 53.8 kg   SpO2 100%   BMI 17.52 kg/m   Physical Exam Vitals and nursing note reviewed.  HENT:     Head: Normocephalic.     Ears:     Comments: Patient is hard of hearing. Eyes:     Pupils: Pupils are  equal, round, and reactive to light.  Cardiovascular:     Rate and Rhythm: Regular rhythm.  Pulmonary:     Breath sounds: No wheezing or rhonchi.  Abdominal:     Tenderness: There is no abdominal tenderness.  Musculoskeletal:     Cervical back: Neck supple.     Comments: Previous right above-knee amputation.  Some blood on down but no wounds seen on stump.  Laceration to left medial calf.  Approximately 8 cm long and V-shaped.  Skin:    General: Skin is warm.  Neurological:     Mental Status: He is alert.     ED Results / Procedures / Treatments   Labs (all labs ordered are listed, but only abnormal results are displayed) Labs Reviewed  BASIC METABOLIC PANEL - Abnormal; Notable for the following components:      Result Value   Sodium 134 (*)    Potassium 3.2 (*)    Chloride 93 (*)    CO2 34 (*)    Glucose, Bld 103 (*)    BUN 25 (*)    Creatinine, Ser 1.38 (*)    GFR, Estimated 49 (*)    All other components within normal limits  CBC - Abnormal; Notable for the following components:   RBC 2.83 (*)    Hemoglobin 8.7 (*)    HCT 27.3 (*)    All other components within normal limits    EKG EKG Interpretation  Date/Time:  Thursday April 08 2020 06:28:39 EDT Ventricular Rate:  128 PR Interval:  112 QRS Duration: 132 QT Interval:  383 QTC Calculation: 562 R Axis:   214 Text Interpretation: Atrial flutter Right bundle branch block When compared with prior ecg, atrial flutter has replaced sinusrhythm Confirmed by Veryl Speak [phone]) on 04/08/2020 6:47:34 AM   Radiology DG Tibia/Fibula Left  Result Date: 04/08/2020 CLINICAL DATA:  Fall.  Laceration.  Pain. EXAM: LEFT TIBIA AND FIBULA - 2 VIEW COMPARISON:  No recent. FINDINGS: Soft tissue bandage noted over the left lower extremity. Small amount of soft tissue air may be present consistent with the patient's history. No radiopaque foreign body. Soft tissue swelling. No acute bony or joint abnormality identified. No  evidence of fracture or dislocation. Peripheral vascular calcification. IMPRESSION: 1. Soft tissue bandage noted. Small amount of soft tissue air may be present consistent with the patient's history. No radiopaque foreign body noted. Soft tissue swelling. No acute bony or joint abnormality. 2.  Peripheral vascular disease. Electronically Signed   By: Marcello Moores  Register   On: 04/08/2020 07:41    Procedures Procedures   Medications Ordered in ED Medications  lidocaine-EPINEPHrine (XYLOCAINE W/EPI) 1 %-1:100000 (with pres) injection 20 mL (has no administration in time range)  diltiazem (CARDIZEM) 125 mg in dextrose 5% 125 mL (1 mg/mL) infusion (has no administration in time range)  sodium chloride 0.9 % bolus 500 mL (500 mLs Intravenous New Bag/Given (Non-Interop) 04/08/20 0800)    ED Course  I have  reviewed the triage vital signs and the nursing notes.  Pertinent labs & imaging results that were available during my care of the patient were reviewed by me and considered in my medical decision making (see chart for details).    MDM Rules/Calculators/A&P                         Patient presents after mechanical fall.  Got out of bed after he forgot that he did not have a right lower leg.  Laceration to left lower leg.  However found to be in atrial flutter with RVR.  Does have history of same.  Not on anticoagulation.  However while in the ER he did convert back to sinus rhythm but then did have conversion back into atrial flutter.  We will start a low-dose Cardizem for rate control.  Not a good candidate for ED cardioversion with not being on anticoagulation and some uncertainty about time of onset.  Hemoglobin is 8.7.  It has been lower than it was in the last couple days but has been at this level recently.  Reported to have a large amount of blood loss at home.  Blood pressure is overall pretty good although trends to the lower side.  Will require serial hemoglobins to follow-up potentially been  transfusion.  Creatinine mildly increased.  Fluid bolus given.  Will discuss with hospitalist for admission and cardiology has been notified  CRITICAL CARE Performed by: Davonna Belling Total critical care time: 30 minutes Critical care time was exclusive of separately billable procedures and treating other patients. Critical care was necessary to treat or prevent imminent or life-threatening deterioration. Critical care was time spent personally by me on the following activities: development of treatment plan with patient and/or surrogate as well as nursing, discussions with consultants, evaluation of patient's response to treatment, examination of patient, obtaining history from patient or surrogate, ordering and performing treatments and interventions, ordering and review of laboratory studies, ordering and review of radiographic studies, pulse oximetry and re-evaluation of patient's condition.   Final Clinical Impression(s) / ED Diagnoses Final diagnoses:  Fall, initial encounter  Atrial flutter with rapid ventricular response ([place])  Anemia, unspecified type    Rx / DC Orders ED Discharge Orders    None       Davonna Belling, MD 04/08/20 [phone]

## 2020-04-08 NOTE — ED Notes (Signed)
Patient transported to X-ray, back in room, resting.

## 2020-04-08 NOTE — Consult Note (Addendum)
Cardiology Consultation:   Patient ID: [name] MRN: [id number]; DOB: [birth year]  Admit date: 04/08/2020 Date of Consult: 04/08/2020  PCP:  [name], MD   [place]  Cardiologist:  Sinclair Grooms, MD  Advanced Practice Provider:  No care team member to display Electrophysiologist:  None  Patient Profile:   [name] is a 87 y.o. male with a PMH of CAD s/p CABG in 2003, PVD s/p right AKA 08/2019 with revision 01/2020, carotid artery disease, chronic diastolic CHF, HTN, HLD, paroxysmal atrial fibrillation, COPD on home O2, and recent TRUBT 04/06/20, who is being seen today for the evaluation of atrial flutter at the request of Dr. Lorin Mercy.  History of Present Illness:   [name] recently underwent TURBT procedure 3/41/96 without complications. There was initial concern for ability to manage patients care at home, however he was ultimately discharged home the same day following his procedure. He was doing fine up until the early morning hours of 04/08/20 when he went to get out of bed, having forgotten about his prior right AKA, and fell resulting in a laceration to his left leg. On EMS arrival he was tachycardic and mildly hypotensive.  He was last evaluated by cardiology at an outpatient visit with [name], NP 03/01/20 at which time he there was ongoing concern for failure to thrive. He had no chest pain complaints at that time and was maintaining sinus rhythm. Family discussion between patient, wife, and son resulted in decision to pursue home hospice, for which orders were placed at that time.  His last echocardiogram 01/2020 showed EF 60-65%, no RWMA, mild concentric LVH, indeterminate LV diastolic function, and no significant valvular defects. His last ischemic evaluation was a NST in 2019 which showed EF 48%, large infarct involving the inferior wall including the inferior apex, deemed an intermediate study, though no further work-up completed at  that time. His paroxysmal atrial fibrillation/flutter history dates back to 01/2020 following his AKA revision. He was deemed a poor candidate for anticoagulation at that time due to anemia.   He felt he was doing okay yesterday and reported sleeping well last night for the first time in a while. He reports his breathing has been a little worse recently with increased SOB and chest congestion. He has chronic chest tightness which he attributes to his COPD, though has not required an SL nitro. He thought he may have noticed some racing heart beat sensations over the past couple days but nothing lasting for any significant amount of time. This morning he woke up and reported feeling groggy. He went to get out of bed but forgot he had prior AKA and fell out of bed resulting in a laceration to his leg. He has not noticed any significant palpitations since arrival to the ED.   ED course: vitals with intermittent tachycardia up to 130s, intermittent hypotension, and otherwise VSS. Labs notable for NA 134, K 3.2, Cr 1.38 (baseline 0.9), Hgb 8.7, PLT 196. Initial EKG with atrial flutter with RVR, rate 128 bpm, RBBB. Repeat EKG with sinus rhythm, rate 75 bpm, RBBB, no STE/D. He had a repair of his left lower leg laceration requiring 14 sutures. He was started on a diltiazem gtt with improvement in HR. He was admitted to medicine. Cardiology asked to evaluate for atrial flutter.   Past Medical History:  Diagnosis Date  . Anxiety   . Arthritis    "generalized" (04/04/2017)  . Asthma   .  Basal cell carcinoma    "left ear"  . BPH (benign prostatic hyperplasia)    severe; s/p multiple biopsies  . CAD (coronary artery disease)   . Carotid artery occlusion   . Chronic pain 04/08/2020  . Chronic respiratory failure ([place])   . Chronic rhinitis   . COPD (chronic obstructive pulmonary disease) (HCC)    2L [place] O2  . Depression   . Diastolic heart failure ([place]) 2019  . Dilation of biliary tract   . Elevated troponin  03/09/2017  . Fall 04/08/2020  . Gallstones   . GERD (gastroesophageal reflux disease)   . History of blood transfusion    "w/his CABG" (04/04/2017)  . History of kidney stones   . Hyperlipidemia   . Hypertension   . On home oxygen therapy    "~ 24/7" (04/04/2017)  . Peripheral vascular disease ([place])   . Pneumonia 2019  . Squamous cell carcinoma of skin 04/23/2013   in situ-Right flank (txpbx)  . Squamous cell carcinoma of skin 01/23/2017   [place]/ in siut- Left ear rim (CX3FU)  . Syncope and collapse   . Ureteral tumor 08/2015   had endoscopic procedure for evaluation, unable to reach for biopsy  . Urinary catheter (Foley) change required     Past Surgical History:  Procedure Laterality Date  . ABDOMINAL AORTOGRAM W/LOWER EXTREMITY Bilateral 06/30/2019   Procedure: ABDOMINAL AORTOGRAM W/LOWER EXTREMITY;  Surgeon: Waynetta Sandy, MD;  Location: [facility];  Service: Cardiovascular;  Laterality: Bilateral;  . AMPUTATION Right 08/11/2019   Procedure: RIGHT ABOVE KNEE AMPUTATION;  Surgeon: Angelia Mould, MD;  Location: [facility];  Service: Vascular;  Laterality: Right;  . AMPUTATION Right 01/27/2020   Procedure: RIGHT AMPUTATION ABOVE KNEE REVISION;  Surgeon: Waynetta Sandy, MD;  Location: [facility];  Service: Vascular;  Laterality: Right;  . BASAL CELL CARCINOMA EXCISION Left    ear  . CARDIAC CATHETERIZATION     "prior to bypass"  . CATARACT EXTRACTION W/ INTRAOCULAR LENS  IMPLANT, BILATERAL Bilateral   . CORONARY ARTERY BYPASS GRAFT  10/2001   LIMA to LAD, SVG to OM1-2, SVG to RCA and PDA  . LITHOTRIPSY    . PROSTATE BIOPSY  10/2012   had bleeding after surgery.  . TONSILLECTOMY    . TRANSURETHRAL RESECTION OF BLADDER TUMOR N/A 04/06/2020   Procedure: TRANSURETHRAL RESECTION OF BLADDER TUMOR (TURBT)/ POST OPERATIVE INSTILLATION OF GEMCITABINE;  Surgeon: Festus Aloe, MD;  Location: [facility];  Service: Urology;  Laterality: N/A;     Home  Medications:  Prior to Admission medications   Medication Sig Start Date End Date Taking? Authorizing Provider  acetaminophen (TYLENOL) 500 MG tablet Take 500 mg by mouth every 6 (six) hours as needed for moderate pain.   Yes [provider]  alfuzosin (UROXATRAL) 10 MG 24 hr tablet Take 1 tablet (10 mg total) by mouth daily with breakfast. 04/06/20  Yes Festus Aloe, MD  ALPRAZolam Duanne Moron) 0.5 MG tablet Take 0.5 mg by mouth 3 (three) times daily as needed for anxiety. 03/05/20  Yes [provider]  aspirin 81 MG tablet Take 81 mg by mouth daily.   Yes [provider]  atorvastatin (LIPITOR) 20 MG tablet Take 20 mg by mouth at bedtime. 03/19/20  Yes [provider]  calcium carbonate (TUMS - DOSED IN MG ELEMENTAL CALCIUM) 500 MG chewable tablet Chew 1 tablet (200 mg of elemental calcium total) by mouth 2 (two) times daily as needed for indigestion or  heartburn. 09/01/19  Yes Love, Ivan Anchors, PA-C  Cholecalciferol (VITAMIN D3) 50 MCG (2000 UT) TABS Take 2,000 Units by mouth daily with lunch. 02/16/20  Yes Angiulli, Lavon Paganini, PA-C  clotrimazole-betamethasone (LOTRISONE) cream Apply 1 application topically daily. Apply to affected area. 03/31/20  Yes [provider]  famotidine (PEPCID) 20 MG tablet Take 20 mg by mouth at bedtime as needed for heartburn or indigestion.   Yes [provider]  finasteride (PROSCAR) 5 MG tablet Take 1 tablet (5 mg total) by mouth daily with lunch. 02/16/20  Yes Angiulli, Lavon Paganini, PA-C  fluticasone (FLONASE) 50 MCG/ACT nasal spray Place 2 sprays into both nostrils daily.   Yes [provider]  furosemide (LASIX) 40 MG tablet Take 1 tablet (40 mg total) by mouth daily. May take up to 80 mg as needed. 03/01/20  Yes Burtis Junes, NP  guaiFENesin (MUCINEX) 600 MG 12 hr tablet Take 1 tablet (600 mg total) by mouth 2 (two) times daily. Patient taking differently: Take 600 mg by mouth as needed for cough. 02/16/20  Yes  Angiulli, Lavon Paganini, PA-C  HYDROcodone-acetaminophen (NORCO/VICODIN) 5-325 MG tablet Take 1 tablet by mouth every 4 (four) hours as needed for moderate pain. 02/16/20  Yes Angiulli, Lavon Paganini, PA-C  levalbuterol [facility] HFA) 45 MCG/ACT inhaler Inhale 2 puffs into the lungs every 4 (four) hours as needed for wheezing or shortness of breath. 02/16/20  Yes Angiulli, Lavon Paganini, PA-C  levalbuterol (XOPENEX) 0.63 MG/3ML nebulizer solution Take 3 mLs (0.63 mg total) by nebulization every 4 (four) hours as needed for wheezing or shortness of breath. 10/13/19  Yes Tanda Rockers, MD  metoprolol succinate (TOPROL-XL) 25 MG 24 hr tablet Take 1 tablet (25 mg total) by mouth daily. 02/16/20  Yes Angiulli, Lavon Paganini, PA-C  Multiple Vitamins-Minerals (PRESERVISION AREDS 2) CAPS Take 1 capsule by mouth daily.   Yes [provider]  NITROSTAT 0.4 MG SL tablet Place 0.4 mg under the tongue every 5 (five) minutes as needed for chest pain.   Yes [provider]  OXYGEN Inhale 2 L/min into the lungs continuous.    Yes [provider]  pantoprazole (PROTONIX) 40 MG tablet TAKE 1 TABLET(40 MG) BY MOUTH DAILY 30 TO 60 MINUTES BEFORE FIRST MEAL OF THE DAY Patient taking differently: Take 40 mg by mouth daily before breakfast. 02/16/20  Yes Angiulli, Lavon Paganini, PA-C  Polyethyl Glycol-Propyl Glycol (SYSTANE) 0.4-0.3 % SOLN Apply 1 drop to eye 2 (two) times daily as needed (eye irritation). Patient taking differently: Apply 1 drop to eye 3 (three) times daily as needed (eye irritation). 09/01/19  Yes Love, Ivan Anchors, PA-C  polyethylene glycol (MIRALAX / GLYCOLAX) 17 g packet Take 17 g by mouth daily. Patient taking differently: Take 17 g by mouth daily as needed for moderate constipation. 09/01/19  Yes Love, Ivan Anchors, PA-C  predniSONE (DELTASONE) 10 MG tablet Take 1 tablet (10 mg total) by mouth daily. 02/16/20  Yes Angiulli, Lavon Paganini, PA-C  sertraline (ZOLOFT) 25 MG tablet Take 25 mg by mouth daily. 02/25/20  Yes  [provider]  sodium chloride (MURO 128) 5 % ophthalmic solution Place 1 drop into both eyes 4 (four) times daily. 08/22/19  Yes Love, Ivan Anchors, PA-C  SYMBICORT 160-4.5 MCG/ACT inhaler Inhale 2 puffs into the lungs 2 (two) times daily. 02/16/20  Yes Angiulli, Lavon Paganini, PA-C  Tiotropium Bromide Monohydrate (SPIRIVA RESPIMAT) 2.5 MCG/ACT AERS INHALE 2 PUFFS BY MOUTH EVERY DAY Patient taking differently: No  sig reported 05/14/19  Yes Tanda Rockers, MD  vitamin B-12 (CYANOCOBALAMIN) 1000 MCG tablet Take 1 tablet (1,000 mcg total) by mouth daily. 02/16/20  Yes Angiulli, Lavon Paganini, PA-C  atorvastatin (LIPITOR) 40 MG tablet Take 1 tablet (40 mg total) by mouth at bedtime. Patient not taking: Reported on 04/08/2020 02/16/20   Cathlyn Parsons, PA-C    Inpatient Medications: Scheduled Meds: . [START ON 04/09/2020] alfuzosin  10 mg Oral Q breakfast  . [START ON 04/09/2020] aspirin EC  81 mg Oral Daily  . atorvastatin  20 mg Oral QHS  . docusate sodium  100 mg Oral BID  . doxycycline  50 mg Oral Q12H  . enoxaparin (LOVENOX) injection  30 mg Subcutaneous Daily  . finasteride  5 mg Oral Q lunch  . [START ON 04/09/2020] fluticasone  2 spray Each Nare Daily  . [START ON 04/09/2020] guaiFENesin  600 mg Oral Daily  . mometasone-formoterol  2 puff Inhalation BID  . [START ON 04/09/2020] pantoprazole  40 mg Oral QAC breakfast  . [START ON 04/09/2020] predniSONE  10 mg Oral Daily  . sertraline  25 mg Oral Daily  . sodium chloride  1 drop Both Eyes QID  . sodium chloride flush  3 mL Intravenous Q12H  . Tiotropium Bromide Monohydrate  2 puff Inhalation Daily   Continuous Infusions:  PRN Meds: acetaminophen **OR** acetaminophen, ALPRAZolam, bisacodyl, hydrALAZINE, HYDROcodone-acetaminophen, levalbuterol, ondansetron **OR** ondansetron (ZOFRAN) IV, Polyethyl Glycol-Propyl Glycol, polyethylene glycol  Allergies:    Allergies  Allergen Reactions  . Cefdinir Diarrhea and Other (See Comments)    Severe Diarrhea   . Nitrofurantoin Swelling and Other (See Comments)    Hand Swelling  . Sulfa Antibiotics Anaphylaxis and Swelling  . Sulfonamide Derivatives Swelling and Other (See Comments)    Facial/tongue swelling  . Tape Other (See Comments)    SKIN IS VERY THIN AND TEARS EASILY!!!!! Please do NOT use "plastic" tape- USE PAPER!!  . Ciprofloxacin Itching, Rash and Other (See Comments)    Red itchy hands  . Nitrofurantoin Swelling and Other (See Comments)    Swollen hands  . Amoxicillin Er Other (See Comments)    Frequest urination. Pt reports "not really" allergic 01/24/20  . Doxycycline Other (See Comments)    "Felt terrible" Pt wife reports "not really" allergic 01/24/20  . Keflex [Cephalexin] Other (See Comments)    Pt does not recall reaction   . Mirtazapine Other (See Comments)  . Other Other (See Comments)  . Ranolazine Other (See Comments)    Inability to ambulate   . Chlorhexidine Gluconate Rash  . Levaquin [Levofloxacin] Itching and Rash  . Sertraline Anxiety and Other (See Comments)    Makes the patient jittery    Social History:   Social History   Socioeconomic History  . Marital status: Married    Spouse name: Ryaan Vanwagoner  . Number of children: 2  . Years of education: 15  . Highest education level: 12th grade  Occupational History  . 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 Smoking status: Former Smoker    Packs/day: 2.00    Years: 33.00    Pack years: 66.00    Types: Cigarettes    Quit date: 03/25/1982    Years since quitting: 38.0  . Smokeless tobacco: Never Used  Vaping Use  . Vaping Use: Never used  Substance and Sexual Activity  . Alcohol use: Yes    Alcohol/week: 0.0 standard drinks  Comment: 1.5-3 oz daily in the past, minimal use in the last few months  . Drug use: No  . Sexual activity: Not Currently  Other Topics Concern  . Not on file  Social History Narrative   ** Merged History Encounter **        Pt lives at home with his spouse.         Social Determinants of Health   Financial Resource Strain: Low Risk   . Difficulty of Paying Living Expenses: Not hard at all  Food Insecurity: No Food Insecurity  . Worried About Charity fundraiser in the Last Year: Never true  . Ran Out of Food in the Last Year: Never true  Transportation Needs: No Transportation Needs  . Lack of Transportation (Medical): No  . Lack of Transportation (Non-Medical): No  Physical Activity: Inactive  . Days of Exercise per Week: 0 days  . Minutes of Exercise per Session: 0 min  Stress: No Stress Concern Present  . Feeling of Stress : Only a little  Social Connections: Moderately Integrated  . Frequency of Communication with Friends and Family: More than three times a week  . Frequency of Social Gatherings with Friends and Family: More than three times a week  . Attends Religious Services: More than 4 times per year  . Active Member of Clubs or Organizations: No  . Attends Archivist Meetings: Never  . Marital Status: Married  Human resources officer Violence: Not At Risk  . Fear of Current or Ex-Partner: No  . Emotionally Abused: No  . Physically Abused: No  . Sexually Abused: No    Family History:    Family History  Problem Relation Age of Onset  . Heart disease Mother   . Heart disease Father        Before age 64  . Breast cancer Mother   . Cancer Mother        Breast cancer  . Hypertension Mother   . Other Mother        AAA  and   Amputation  . Heart attack Father   . Stroke Brother        x3, still living   . Peripheral vascular disease Brother   . Heart disease Brother   . Hyperlipidemia Brother   . Hypertension Brother   . Heart disease Brother   . Allergies Brother      ROS:  Please see the history of present illness.   All other ROS reviewed and negative.     Physical Exam/Data:   Vitals:   04/08/20 0930 04/08/20 1115 04/08/20 1230 04/08/20 1454  BP: 103/64 91/64  96/64 (!) 94/55  Pulse: (!) 132 (!) 131 67 74  Resp: (!) 24 18 20 18   Temp:    98.1 F (36.7 C)  TempSrc:    Oral  SpO2: 100% 100% 100% 100%  Weight:    55.3 kg  Height:    5\' 9"  (1.753 m)    Intake/Output Summary (Last 24 hours) at 04/08/2020 1619 Last data filed at 04/08/2020 1500 Gross per 24 hour  Intake 618.26 ml  Output --  Net 618.26 ml   Last 3 Weights 04/08/2020 04/08/2020 04/06/2020  Weight (lbs) 121 lb 14.6 oz 118 lb 9.7 oz 118 lb 8 oz  Weight (kg) 55.3 kg 53.8 kg 53.751 kg     Body mass index is 18 kg/m.  General:  Thin elderly gentleman sitting upright in bed in NAD HEENT: sclera anicteric  Neck: no JVD Vascular: No carotid bruits; distal pulses 2+ bilaterally Cardiac:  normal S1, S2; RRR; no murmurs, rubs, or gallops Lungs:  clear to auscultation bilaterally, no wheezing, rhonchi or rales  Abd: soft, nontender, no hepatomegaly  Ext: 1-2+ LE edema pitting in thighs Musculoskeletal:  No deformities, BUE and BLE strength normal and equal Skin: warm and dry  Neuro:  CNs 2-12 intact, no focal abnormalities noted Psych:  Normal affect   EKG:  The EKG was personally reviewed and demonstrates:  atrial flutter with RVR, rate 128 bpm, RBBB. Repeat EKG with sinus rhythm, rate 75 bpm, RBBB, no STE/D. Telemetry:  Telemetry was personally reviewed and demonstrates:  Several episodes of atrial fib/flutter with RVR with intermittent sinus rhythm, bigeminy, and possibly even MAT.   Relevant CV Studies: ECHO IMPRESSIONS 01/2020  1. Left ventricular ejection fraction, by estimation, is 60 to 65%. The  left ventricle has normal function. The left ventricle has no regional  wall motion abnormalities. There is mild concentric left ventricular  hypertrophy. Left ventricular diastolic  parameters are indeterminate.  2. Right ventricular systolic function is normal. The right ventricular  size is normal.  3. The mitral valve is normal in structure. No evidence of mitral valve   regurgitation. No evidence of mitral stenosis.  4. The aortic valve is tricuspid. There is mild calcification of the  aortic valve. There is mild thickening of the aortic valve. Aortic valve  regurgitation is not visualized. Mild aortic valve sclerosis is present,  with no evidence of aortic valve  stenosis.  5. The inferior vena cava is normal in size with greater than 50%  respiratory variability, suggesting right atrial pressure of 3 mmHg.  Laboratory Data:  High Sensitivity Troponin:  No results for input(s): TROPONINIHS in the last 720 hours.   Chemistry Recent Labs  Lab 04/02/20 1411 04/06/20 1553 04/08/20 0640  NA 133* 132* 134*  K 3.0* 3.4* 3.2*  CL 92* 91* 93*  CO2 32 32 34*  GLUCOSE 122* 142* 103*  BUN 19 22 25*  CREATININE 0.98 1.07 1.38*  CALCIUM 9.1 8.9 8.9  GFRNONAA >60 >60 49*  ANIONGAP 9 9 7     No results for input(s): PROT, ALBUMIN, AST, ALT, ALKPHOS, BILITOT in the last 168 hours. Hematology Recent Labs  Lab 04/02/20 1411 04/06/20 1553 04/08/20 0640  WBC 9.2 10.8* 7.3  RBC 3.27* 3.11* 2.83*  HGB 10.1* 9.6* 8.7*  HCT 32.1* 30.3* 27.3*  MCV 98.2 97.4 96.5  MCH 30.9 30.9 30.7  MCHC 31.5 31.7 31.9  RDW 12.9 12.9 12.8  PLT 240 228 196   BNPNo results for input(s): BNP, PROBNP in the last 168 hours.  DDimer No results for input(s): DDIMER in the last 168 hours.   Radiology/Studies:  DG Tibia/Fibula Left  Result Date: 04/08/2020 CLINICAL DATA:  Fall.  Laceration.  Pain. EXAM: LEFT TIBIA AND FIBULA - 2 VIEW COMPARISON:  No recent. FINDINGS: Soft tissue bandage noted over the left lower extremity. Small amount of soft tissue air may be present consistent with the patient's history. No radiopaque foreign body. Soft tissue swelling. No acute bony or joint abnormality identified. No evidence of fracture or dislocation. Peripheral vascular calcification. IMPRESSION: 1. Soft tissue bandage noted. Small amount of soft tissue air may be present consistent  with the patient's history. No radiopaque foreign body noted. Soft tissue swelling. No acute bony or joint abnormality. 2.  Peripheral vascular disease. Electronically Signed   By: Marcello Moores  Register   On:  04/08/2020 07:41     Assessment and Plan:   1. Paroxysmal atrial fibrillation/flutter: patient presented after a fall from bed. Noted to be tachycardic and mildly hypotensive on EMS arrival. Found to be in atrial flutter with RVR on EKG on arrival to ED. He was started on a diltiazem gtt with conversion to sinus rhythm. Dilt gtt stopped. Cardiology asked to see. Anticoagulation initially deferred following his initial episode post-op after AKA revision surgery. Likely this episode was triggered by recent TURBT. CHA2DS2-VASc Score = 6 [CHF History: Yes, HTN History: Yes, Diabetes History: Yes, Stroke History: No, Vascular Disease History: Yes, Age Score: 2, Gender Score: 0].  Therefore, the patient's annual risk of stroke is 9.7 %. Unfortunately he is likely high risk for anticoagulation given anemia, falls, and comorbidities.  - Favor starting amiodarone in an effort to suppress episodes.   - Can restart metoprolol succinate as BP allows  2. CAD s/p CABG: No anginal complaints - Continue aspirin - Continue BBlocker as BP allows  3. Chronic respiratory failure in patient with COPD and chronic diastolic CHF: reports mildly increased SOB and chest congestion following TRUBT. Lungs sound clear, though does have 1-2+ pitting in LLE up to his thigh.  - Would restart home lasix - Continue pulmonary meds per primary team  4. HTN: BP on the soft side.  - Continue BBlocker and lasix as BP will allow  5. PVD: s/p right AKA 08/2019 with revision 01/2020. Now with LLE laceration repaired with 14 sutures. High risk for poor healing - Continue aspirin - Continue close monitoring of wound   6. Bladder tumor: s/p TURBT 04/06/20 with evidence of malignancy on pathology.  - Continue management per primary team  and urology  Risk Assessment/Risk Scores:   New York Heart Association (NYHA) Functional Class NYHA Class III  CHA2DS2-VASc Score = 6  This indicates a 9.7% annual risk of stroke. The patient's score is based upon: CHF History: Yes HTN History: Yes Diabetes History: Yes Stroke History: No Vascular Disease History: Yes Age Score: 2 Gender Score: 0  For questions or updates, please contact [place] Please consult [email or web address] for contact info under   Signed, Abigail Butts, PA-C  04/08/2020 4:19 PM   The patient was seen, examined and discussed with Abigail Butts, PA-C  and I agree with the above.   87 y.o. male with a PMH of CAD s/p CABG in 2003, PVD s/p right AKA 08/2019 with revision 01/2020, carotid artery disease, chronic diastolic CHF, HTN, HLD, paroxysmal atrial fibrillation post AKA surgery in January 2022, COPD on home O2, and recent TRUBT 04/06/20,who is being seen today for the evaluation of atrial flutter. He underwent TURBT procedure 0/86/57 without complications, he was discharged home, he tripped and fell of the bed this morning where he developed a laceration on his leg, EMS was called and found him hypotensive and tachycardic.  In the ED patient was found to be in atrial flutter with 2-1 block and ventricular rate 150 bpm, alternating with sinus rhythm, currently in atrial flutter with variable block and ventricular rate in 80s.  On physical exam he is very hard of hearing, he has no JVDs no carotid bruits, his lungs are clear, he has irregular rate and rhythm no obvious murmur, his extremities are warm with good peripheral pulses and no edema.  Labs show potassium of 3.2, sodium 134, creatinine 1.38, hemoglobin 8.0, platelets 170.  Assessment and plan  Paroxysmal atrial flutter -CHA2DS2-VASc 6 however thepatient has  had multiple recent falls post right AKA, he also has bladder cancer and chronic anemia.  He is cancer also places him at high risk of  thrombosis, considering this is only second episode of atrial flutter and both were in a postop.  I will start amiodarone drip followed by p.o. amiodarone with intention of early cardioversion and hopefully the patient remains in sinus rhythm.  We will continue his home Toprol-XL mg daily. -we will Replace potassium  CAD, status post CABG -The patient has not been on statins secondary to overall failure to thrive  Acute on chronic respiratory failure on home oxygen  Bladder cancer - s/p TURBT 04/06/20   PVD  -s/p right AKA 08/2019 with revision 01/2020.   Ena Dawley, MD 04/08/2020

## 2020-04-08 NOTE — ED Triage Notes (Signed)
Per EMS, pt from home w/ wife, reports he "forgot I didn't have a leg" (right aka) and fell out of bed.  Laceration to medial left calf.  Pt was also found to be in Afib RVR.  Pt has a urinary catheter, having surgery on his bladder two days ago.  18 G left forarm 96/50 Hr 120-130 100% on 2l [place] (HOME O2)  Given 500NS and 324 ASA

## 2020-04-08 NOTE — Telephone Encounter (Signed)
done

## 2020-04-08 NOTE — ED Notes (Signed)
Pt used albuterol inhaler from home per EDP advice. Noted to temporarily raise HR to 130 which lowered back to ~90.

## 2020-04-08 NOTE — ED Notes (Signed)
Attempted to give report, Vernie Shanks RN will call back asap per secretary on the floor.

## 2020-04-08 NOTE — ED Notes (Signed)
Lidocaine at bedside with suture cart.

## 2020-04-08 NOTE — Telephone Encounter (Signed)
Form found and placed in MW's lookat in A pod for signature.

## 2020-04-09 ENCOUNTER — Telehealth: Payer: Self-pay | Admitting: Cardiovascular Disease

## 2020-04-09 DIAGNOSIS — I483 Typical atrial flutter: Secondary | ICD-10-CM | POA: Diagnosis present

## 2020-04-09 DIAGNOSIS — I4891 Unspecified atrial fibrillation: Secondary | ICD-10-CM | POA: Diagnosis not present

## 2020-04-09 DIAGNOSIS — I110 Hypertensive heart disease with heart failure: Secondary | ICD-10-CM | POA: Diagnosis present

## 2020-04-09 DIAGNOSIS — R627 Adult failure to thrive: Secondary | ICD-10-CM | POA: Diagnosis present

## 2020-04-09 DIAGNOSIS — I10 Essential (primary) hypertension: Secondary | ICD-10-CM | POA: Diagnosis not present

## 2020-04-09 DIAGNOSIS — Z89611 Acquired absence of right leg above knee: Secondary | ICD-10-CM | POA: Diagnosis not present

## 2020-04-09 DIAGNOSIS — Z20822 Contact with and (suspected) exposure to covid-19: Secondary | ICD-10-CM | POA: Diagnosis present

## 2020-04-09 DIAGNOSIS — Z681 Body mass index (BMI) 19 or less, adult: Secondary | ICD-10-CM | POA: Diagnosis not present

## 2020-04-09 DIAGNOSIS — I480 Paroxysmal atrial fibrillation: Principal | ICD-10-CM

## 2020-04-09 DIAGNOSIS — D638 Anemia in other chronic diseases classified elsewhere: Secondary | ICD-10-CM | POA: Diagnosis present

## 2020-04-09 DIAGNOSIS — W19XXXA Unspecified fall, initial encounter: Secondary | ICD-10-CM | POA: Diagnosis not present

## 2020-04-09 DIAGNOSIS — I509 Heart failure, unspecified: Secondary | ICD-10-CM | POA: Diagnosis not present

## 2020-04-09 DIAGNOSIS — C675 Malignant neoplasm of bladder neck: Secondary | ICD-10-CM | POA: Diagnosis present

## 2020-04-09 DIAGNOSIS — Z23 Encounter for immunization: Secondary | ICD-10-CM | POA: Diagnosis not present

## 2020-04-09 DIAGNOSIS — Y92013 Bedroom of single-family (private) house as the place of occurrence of the external cause: Secondary | ICD-10-CM | POA: Diagnosis not present

## 2020-04-09 DIAGNOSIS — Z66 Do not resuscitate: Secondary | ICD-10-CM | POA: Diagnosis present

## 2020-04-09 DIAGNOSIS — W06XXXA Fall from bed, initial encounter: Secondary | ICD-10-CM | POA: Diagnosis present

## 2020-04-09 DIAGNOSIS — H9190 Unspecified hearing loss, unspecified ear: Secondary | ICD-10-CM | POA: Diagnosis present

## 2020-04-09 DIAGNOSIS — J9611 Chronic respiratory failure with hypoxia: Secondary | ICD-10-CM | POA: Diagnosis present

## 2020-04-09 DIAGNOSIS — S81812A Laceration without foreign body, left lower leg, initial encounter: Secondary | ICD-10-CM | POA: Diagnosis present

## 2020-04-09 DIAGNOSIS — Y92009 Unspecified place in unspecified non-institutional (private) residence as the place of occurrence of the external cause: Secondary | ICD-10-CM | POA: Diagnosis not present

## 2020-04-09 DIAGNOSIS — E785 Hyperlipidemia, unspecified: Secondary | ICD-10-CM | POA: Diagnosis present

## 2020-04-09 DIAGNOSIS — F0390 Unspecified dementia without behavioral disturbance: Secondary | ICD-10-CM | POA: Diagnosis present

## 2020-04-09 DIAGNOSIS — I6529 Occlusion and stenosis of unspecified carotid artery: Secondary | ICD-10-CM | POA: Diagnosis present

## 2020-04-09 DIAGNOSIS — K219 Gastro-esophageal reflux disease without esophagitis: Secondary | ICD-10-CM | POA: Diagnosis present

## 2020-04-09 DIAGNOSIS — I5032 Chronic diastolic (congestive) heart failure: Secondary | ICD-10-CM | POA: Diagnosis present

## 2020-04-09 DIAGNOSIS — I959 Hypotension, unspecified: Secondary | ICD-10-CM | POA: Diagnosis present

## 2020-04-09 DIAGNOSIS — Z9981 Dependence on supplemental oxygen: Secondary | ICD-10-CM | POA: Diagnosis not present

## 2020-04-09 DIAGNOSIS — I2510 Atherosclerotic heart disease of native coronary artery without angina pectoris: Secondary | ICD-10-CM | POA: Diagnosis present

## 2020-04-09 DIAGNOSIS — N138 Other obstructive and reflux uropathy: Secondary | ICD-10-CM | POA: Diagnosis present

## 2020-04-09 DIAGNOSIS — J449 Chronic obstructive pulmonary disease, unspecified: Secondary | ICD-10-CM | POA: Diagnosis present

## 2020-04-09 LAB — CBC
HCT: 24.8 % — ABNORMAL LOW (ref 39.0–52.0)
Hemoglobin: 7.9 g/dL — ABNORMAL LOW (ref 13.0–17.0)
MCH: 31 pg (ref 26.0–34.0)
MCHC: 31.9 g/dL (ref 30.0–36.0)
MCV: 97.3 fL (ref 80.0–100.0)
Platelets: 160 10*3/uL (ref 150–400)
RBC: 2.55 MIL/uL — ABNORMAL LOW (ref 4.22–5.81)
RDW: 13 % (ref 11.5–15.5)
WBC: 7.5 10*3/uL (ref 4.0–10.5)
nRBC: 0 % (ref 0.0–0.2)

## 2020-04-09 LAB — BASIC METABOLIC PANEL
Anion gap: 8 (ref 5–15)
BUN: 22 mg/dL (ref 8–23)
CO2: 30 mmol/L (ref 22–32)
Calcium: 8.9 mg/dL (ref 8.9–10.3)
Chloride: 94 mmol/L — ABNORMAL LOW (ref 98–111)
Creatinine, Ser: 1.23 mg/dL (ref 0.61–1.24)
GFR, Estimated: 56 mL/min — ABNORMAL LOW (ref >60)
Glucose, Bld: 94 mg/dL (ref 70–99)
Potassium: 4.6 mmol/L (ref 3.5–5.1)
Sodium: 132 mmol/L — ABNORMAL LOW (ref 135–145)

## 2020-04-09 MED ORDER — FUROSEMIDE 80 MG PO TABS
80.0000 mg | ORAL_TABLET | Freq: Every day | ORAL | Status: DC
  Administered 2020-04-09 – 2020-04-10 (×2): 80 mg via ORAL
  Filled 2020-04-09 (×2): qty 1

## 2020-04-09 MED ORDER — AMIODARONE HCL 200 MG PO TABS
200.0000 mg | ORAL_TABLET | Freq: Two times a day (BID) | ORAL | Status: DC
  Administered 2020-04-09 – 2020-04-10 (×3): 200 mg via ORAL
  Filled 2020-04-09 (×3): qty 1

## 2020-04-09 MED ORDER — ENOXAPARIN SODIUM 40 MG/0.4ML ~~LOC~~ SOLN
40.0000 mg | Freq: Every day | SUBCUTANEOUS | Status: DC
  Administered 2020-04-10: 40 mg via SUBCUTANEOUS
  Filled 2020-04-09: qty 0.4

## 2020-04-09 MED ORDER — AMIODARONE HCL 200 MG PO TABS: 200.0000 mg | ORAL_TABLET | Freq: Every day | ORAL | Status: DC

## 2020-04-09 NOTE — Progress Notes (Addendum)
Occupational Therapy Evaluation Patient Details Name: [name] MRN: [id number] DOB: [birth year] Today's Date: 04/09/2020    History of Present Illness 87 year old male admitted s/p fall from bed. He woke up during the night and forgot that he had an amputation and fell. In the ED initially he was tachycardic hypotensive and was in a flutter.  He has chronic shortness of breath. Pt with multiple complex comorbidities with PVD status post right AKA (revision 1/22) COPD on 2L home oxygen A. fib CAD history of CABG, HTN, HLD history of bladder surgery and tumor resection (3/22)   Clinical Impression   PTA pt lives at home with his wife and was able to transfer himself from w/c<>bed<>BSC with S of his wife. Wife also assisted with ADL tasks as needed. Pt recently discharged from [facility] in February 2022 after his AKA revision and was receiving HHPT. Pt lethargic at times and fatigues with minimal activity. Requires mod A with squat pivot transfer to drop arm recliner and max A with LB ADL. Pt did demonstrate a drop in BP from 129/64 to 102/55 with complaints of dizziness after transferring to chair; Max HR 81; SpO2 100 on 2L. BP 121/56 after sitting 5 min. Pt states "I'm just so weak". Recommend SNF at this time to maximize functional level of independence. [name] is in agreement to looking into SNF as an option as this is a functional decline as compared to his baseline. Recommend Palliative Care Consult. Will follow acutely.     Follow Up Recommendations  SNF;Supervision/Assistance - 24 hour    Equipment Recommendations  Hospital bed;Other (comment) Harrel Lemon)    Recommendations for Other Services PT consult; Palliative Care     Precautions / Restrictions Precautions Precautions: Fall Precaution Comments: fragile skin/risk for skin tears; laceration LLE Required Braces or Orthoses: Other Brace (L adapted shoe) Restrictions Weight Bearing Restrictions: Yes RLE Weight Bearing:  (ABKA)       Mobility Bed Mobility Overal bed mobility: Needs Assistance             General bed mobility comments: pt sitting EOB on entry with wife by his side. She apparently helped him to EOB; pt falling asleep at times EOB; risk for falls    Transfers Overall transfer level: Needs assistance   Transfers: Squat Pivot Transfers     Squat pivot transfers: Mod assist          Balance Overall balance assessment: Needs assistance   Sitting balance-Leahy Scale: Fair                                     ADL either performed or assessed with clinical judgement   ADL Overall ADL's : Needs assistance/impaired     Grooming: Minimal assistance;Sitting   Upper Body Bathing: Minimal assistance;Sitting   Lower Body Bathing: Maximal assistance;Bed level   Upper Body Dressing : Minimal assistance;Sitting   Lower Body Dressing: Maximal assistance;Sitting/lateral leans   Toilet Transfer: Moderate assistance;Squat-pivot (simulated)    Hygiene after toileting:  Pt as an indwelling catheter which he has had @ 5 months; recent surgery for bladder tumor; wife states plan is to remove foley to see if he can urinate; Max A with pericare     Functional mobility during ADLs: Moderate assistance;Cueing for safety;Cueing for sequencing General ADL Comments: fatigues with minimal activity     Vision         Perception  Praxis      Pertinent Vitals/Pain Pain Assessment: 0-10 Pain Score: 6  Pain Location: neck/shoulders Pain Descriptors / Indicators: Aching;Discomfort Pain Intervention(s): Limited activity within patient's tolerance;Premedicated before session (tylenol)     Hand Dominance Right   Extremity/Trunk Assessment Upper Extremity Assessment Upper Extremity Assessment: Generalized weakness   Lower Extremity Assessment Lower Extremity Assessment: Defer to PT evaluation (R AKA)   Cervical / Trunk Assessment Cervical / Trunk Assessment: Kyphotic;Other  exceptions (forward head)   Communication Communication Communication: HOH (very)   Cognition Arousal/Alertness: Lethargic (more alert once up in chair) Behavior During Therapy: WFL for tasks assessed/performed Overall Cognitive Status: Impaired/Different from baseline Area of Impairment: Attention;Safety/judgement;Awareness;Problem solving                   Current Attention Level: Selective     Safety/Judgement: Decreased awareness of safety;Decreased awareness of deficits Awareness: Emergent Problem Solving: Slow processing General Comments: Wife states he is "usually very chipper and interactive; sharp as a tac" Safety concerns as pt forgot his leg was amputated (has been amputated since Aug 2021   General Comments  complaining of dizziness with transfer to chair    Exercises     Shoulder Instructions      Home Living Family/patient expects to be discharged to:: Private residence Living Arrangements: Spouse/significant other Available Help at Discharge: Available 24 hours/day (elderly spouse) Type of Home: House Home Access: Level entry     Home Layout: Multi-level;Bed/bath upstairs     Bathroom Shower/Tub: Walk-in shower;Tub/shower unit   Bathroom Toilet: Standard Bathroom Accessibility: Yes   Home Equipment: Walker - 2 wheels;Cane - single point;Shower seat;Transport chair;Tub bench   Additional Comments: O2 2L, tub bench, drop arm BSC, stair lift      Prior Functioning/Environment Level of Independence: Needs assistance  Gait / Transfers Assistance Needed: S with transfer @ wc level; uses drop arm BSC. wife states he goes from w/c-bed-bsc, then from BSC-bed-w/c. States he likes this sequence because it makes him feel safe. At baseline able to come to standing at the sink ADL's / Homemaking Assistance Needed: pt does @ 50% of his bathing/dressing   Comments: Receiving HHPT        OT Problem List: Decreased strength;Decreased range of  motion;Decreased activity tolerance;Impaired balance (sitting and/or standing);Decreased cognition;Decreased safety awareness;Decreased knowledge of use of DME or AE;Cardiopulmonary status limiting activity;Pain;Increased edema      OT Treatment/Interventions: Self-care/ADL training;Therapeutic exercise;Energy conservation;DME and/or AE instruction;Therapeutic activities;Cognitive remediation/compensation;Patient/family education;Balance training    OT Goals(Current goals can be found in the care plan section) Acute Rehab OT Goals Patient Stated Goal: to come up with a plan OT Goal Formulation: With patient/family Time For Goal Achievement: 04/23/20 Potential to Achieve Goals: Good  OT Frequency: Min 2X/week   Barriers to D/C:            Co-evaluation              AM-PAC OT "6 Clicks" Daily Activity     Outcome Measure Help from another person eating meals?: A Little Help from another person taking care of personal grooming?: A Little Help from another person toileting, which includes using toliet, bedpan, or urinal?: A Lot Help from another person bathing (including washing, rinsing, drying)?: A Lot Help from another person to put on and taking off regular upper body clothing?: A Little Help from another person to put on and taking off regular lower body clothing?: A Lot 6 Click Score: 15   End of Session  Equipment Utilized During Treatment: Gait belt;Oxygen (2L) Nurse Communication: Mobility status;Other (comment) (DC needs)  Activity Tolerance: Patient tolerated treatment well Patient left: in chair;with call bell/phone within reach;with chair alarm set;with family/visitor present  OT Visit Diagnosis: Other abnormalities of gait and mobility (R26.89);Muscle weakness (generalized) (M62.81);History of falling (Z91.81);Other symptoms and signs involving cognitive function;Pain;Dizziness and giddiness (R42) Pain - Right/Left:  (B) Pain - part of body: Shoulder (neck)                 Time: 1150-1230 OT Time Calculation (min): 40 min Charges:  OT General Charges $OT Visit: 1 Visit OT Evaluation $OT Eval Moderate Complexity: 1 Mod OT Treatments $Self Care/Home Management : 23-37 mins  Maurie Boettcher, OT/L   Acute OT Clinical Specialist [place] Pager [phone] Office [phone]   [facility] 04/09/2020, 1:47 PM

## 2020-04-09 NOTE — Telephone Encounter (Signed)
Form was faxed and placed in scan folder

## 2020-04-09 NOTE — Progress Notes (Signed)
PROGRESS NOTE    [name]  HYI:[id number] DOB: 1929-04-04 DOA: 04/08/2020 PCP: [name], MD   Chief Complaint  Patient presents with  . Leg Injury  . Atrial Fibrillation  Brief Narrative: 87 year old male with multiple complex comorbidities with PVD status post right AKA COPD on home oxygen A. fib CAD history of CABG, HTN, HLD history of bladder surgery and tumor resection on last Tuesday and discharged that night and returns to the hospital after a fall at home. He woke up during the night and forgot that he had an amputation and fell. Patient was seen in the ED initially was tachycardic hypotensive for EMS and was in a flutter. He has chronic shortness of breath. Patient was noticed to have lower hemoglobin at 8.7 gm.Patient was placed on Cardizem drip cardiology was consulted and admitted  Subjective: Seen and examined this morning.  Wife is at the bedside.  He is somewhat groggy as per the wife.  Patient was able to interact with me very well less short of breath today.  Is requesting for his Xanax which he takes 3 times a day at home. Complains of more shortness of breath this morning but no chest pain  Assessment & Plan:  Fall at home, initial encounter LLE laceration status post repair Fall after he woke up during the night and forgot that he had amputation.  Mild anemia appears to be close at baseline.  A. fib with RVR history of known A. fib.In the ED on Cardizem drip-she is demented and, this morning emigrant p.o.Appreciate cardiology input.Continue to monitor on 12 mg twice daily with plan to reduce to 10 mg daily in 2 weeks.High risk for anticoagulation given anemia so currently has not started anticoagulation.  Anemia likely from chronic disease,with mild drop in hemoglobin 7.9 g.  Monitor. Check anemia panel. Recent Labs  Lab 04/02/20 1411 04/06/20 1553 04/08/20 0640 04/08/20 1653 04/09/20 0612  HGB 10.1* 9.6* 8.7* 8.0* 7.9*  HCT 32.1* 30.3* 27.3*  25.4* 24.8*   Chronic diastolic CHF: Has lower leg edema resuming home Lasix.  CAD s/p CABG  HLD Essential hypertension: No chest pain.  On aspirin,lipitor. BP controlled.   COPD   GOLD IV/ Chronic hypoxia and chronic prednisone therapy:  PVD status post right AKA 8/21 revision 1/22 now with LLE Magda Paganini recently requested 14 sutures in the ED.High risk for poor healing continue aspirin, continue doxycycline monitor on  Bladder Tumor s/p TURBT 3/29 with evidence of malignancy on pathology continue management as per primary team urology on outpatient basis:  Anxiety disorder-resumE home xanax tidp rn.  Sodium 132 monitor in the setting of CHF/lasix  Goals of care DNR, request palliative care ongoing follow-up  Diet Order            Diet Heart Room service appropriate? Yes; Fluid consistency: Thin  Diet effective now               Patient's Body mass index is 18 kg/m. Pressure Injury 04/08/20 Buttocks Medial Stage 3 -  Full thickness tissue loss. Subcutaneous fat may be visible but bone, tendon or muscle are NOT exposed. (Active)  04/08/20 1500  Location: Buttocks  Location Orientation: Medial  Staging: Stage 3 -  Full thickness tissue loss. Subcutaneous fat may be visible but bone, tendon or muscle are NOT exposed.  Wound Description (Comments):   Present on Admission: Yes    DVT prophylaxis: enoxaparin (LOVENOX) injection 30 mg Start: 04/08/20 1300 Code Status:   Code  Status: Partial Code  Family Communication: plan of care discussed with patient and his wife at bedside. Patient and wife are Known to me from previous follow-up after Rt AKA. Her son is a Stage manager in [place].  Status is: Observation Remains hospitalized for ongoing shortness of breath A. fib management PT OT EVAL fall precaution  Dispo: The patient is from: Home              Anticipated d/c is to: TBD              Patient currently is not medically stable to d/c.   Difficult to place patient  No  Unresulted Labs (From admission, onward)         None    Medications reviewed:  Scheduled Meds: . alfuzosin  10 mg Oral Q breakfast  . amiodarone  200 mg Oral BID  . [START ON 04/23/2020] amiodarone  200 mg Oral Daily  . aspirin EC  81 mg Oral Daily  . atorvastatin  20 mg Oral QHS  . Chlorhexidine Gluconate Cloth  6 each Topical Q0600  . docusate sodium  100 mg Oral BID  . doxycycline  50 mg Oral Q12H  . enoxaparin (LOVENOX) injection  30 mg Subcutaneous Daily  . finasteride  5 mg Oral Q lunch  . fluticasone  2 spray Each Nare Daily  . furosemide  80 mg Oral Daily  . guaiFENesin  600 mg Oral Daily  . mometasone-formoterol  2 puff Inhalation BID  . pantoprazole  40 mg Oral QAC breakfast  . predniSONE  10 mg Oral Daily  . sertraline  25 mg Oral Daily  . sodium chloride  1 drop Both Eyes QID  . sodium chloride flush  3 mL Intravenous Q12H  . Tiotropium Bromide Monohydrate  2 puff Inhalation Daily   Continuous Infusions:  Consultants:see note  Procedures:see note  Antimicrobials: Anti-infectives (From admission, onward)   Start     Dose/Rate Route Frequency Ordered Stop   04/08/20 1300  doxycycline (VIBRAMYCIN) 50 MG capsule 50 mg        50 mg Oral Every 12 hours 04/08/20 1206       Culture/Microbiology    Component Value Date/Time   SDES EXPECTORATED SPUTUM 01/30/2020 1613   SPECREQUEST NONE 01/30/2020 1613   CULT  01/24/2020 1548    NO GROWTH 5 DAYS Performed at [facility], [place] [address], [place], [place] [address]    REPTSTATUS 01/31/2020 FINAL 01/30/2020 1613    Other culture-see note  Objective: Vitals: Today's Vitals   04/09/20 0300 04/09/20 0500 04/09/20 0739 04/09/20 1000  BP: (!) 81/50 104/67 130/62 136/69  Pulse: 65 80 80   Resp:  19 18 16   Temp:  98.3 F (36.8 C) (!) 97.4 F (36.3 C) 97.9 F (36.6 C)  TempSrc:  Axillary Axillary Oral  SpO2: 100% 99% 100%   Weight:      Height:      PainSc:    5     Intake/Output Summary (Last  24 hours) at 04/09/2020 1153 Last data filed at 04/09/2020 0500 Gross per 24 hour  Intake 341.16 ml  Output 600 ml  Net -258.84 ml   Filed Weights   04/08/20 0631 04/08/20 1454  Weight: 53.8 kg 55.3 kg   Weight change: 1.5 kg  Intake/Output from previous day: 03/31 0701 - 04/01 0700 In: 941.2 [I.V.:341.2; IV Piggyback:600] Out: 600 [Urine:600] Intake/Output this shift: No intake/output data recorded. Filed Weights   04/08/20 0631  04/08/20 1454  Weight: 53.8 kg 55.3 kg    Examination: General exam: AAO, somewhat sleepy but interactive, older than his stated age chronically sick looking,NAD, weak appearing. HEENT:Oral mucosa moist, Ear/Nose WNL grossly,dentition normal. Respiratory system: bilaterally diminished breath sound,no use of accessory muscle, non tender. Cardiovascular system: S1 & S2 +, regular, No JVD. Gastrointestinal system: Abdomen soft, NT,ND, BS+. Nervous System:Alert, awake, moving extremities and grossly nonfocal Extremities: Right AKA, left lower extremity laceration with suture intact no edema, distal peripheral pulses palpable.  Skin: No rashes,no icterus. MSK: Normal muscle bulk,tone, power  Data Reviewed: I have personally reviewed following labs and imaging studies CBC: Recent Labs  Lab 04/02/20 1411 04/06/20 1553 04/08/20 0640 04/08/20 1653 04/09/20 0612  WBC 9.2 10.8* 7.3 8.4 7.5  HGB 10.1* 9.6* 8.7* 8.0* 7.9*  HCT 32.1* 30.3* 27.3* 25.4* 24.8*  MCV 98.2 97.4 96.5 98.1 97.3  PLT 240 228 196 170 277   Basic Metabolic Panel: Recent Labs  Lab 04/02/20 1411 04/06/20 1553 04/08/20 0640 04/09/20 0612  NA 133* 132* 134* 132*  K 3.0* 3.4* 3.2* 4.6  CL 92* 91* 93* 94*  CO2 32 32 34* 30  GLUCOSE 122* 142* 103* 94  BUN 19 22 25* 22  CREATININE 0.98 1.07 1.38* 1.23  CALCIUM 9.1 8.9 8.9 8.9   GFR: Estimated Creatinine Clearance: 32.5 mL/min (by C-G formula based on SCr of 1.23 mg/dL). Liver Function Tests: No results for input(s): AST, ALT,  ALKPHOS, BILITOT, PROT, ALBUMIN in the last 168 hours. No results for input(s): LIPASE, AMYLASE in the last 168 hours. No results for input(s): AMMONIA in the last 168 hours. Coagulation Profile: No results for input(s): INR, PROTIME in the last 168 hours. Cardiac Enzymes: No results for input(s): CKTOTAL, CKMB, CKMBINDEX, TROPONINI in the last 168 hours. BNP (last 3 results) No results for input(s): PROBNP in the last 8760 hours. HbA1C: No results for input(s): HGBA1C in the last 72 hours. CBG: No results for input(s): GLUCAP in the last 168 hours. Lipid Profile: No results for input(s): CHOL, HDL, LDLCALC, TRIG, CHOLHDL, LDLDIRECT in the last 72 hours. Thyroid Function Tests: No results for input(s): TSH, T4TOTAL, FREET4, T3FREE, THYROIDAB in the last 72 hours. Anemia Panel: No results for input(s): VITAMINB12, FOLATE, FERRITIN, TIBC, IRON, RETICCTPCT in the last 72 hours. Sepsis Labs: No results for input(s): PROCALCITON, LATICACIDVEN in the last 168 hours.  Recent Results (from the past 240 hour(s))  SARS CORONAVIRUS 2 (TAT 6-24 HRS) Nasopharyngeal Nasopharyngeal Swab     Status: None   Collection Time: 04/02/20  1:42 PM   Specimen: Nasopharyngeal Swab  Result Value Ref Range Status   SARS Coronavirus 2 NEGATIVE NEGATIVE Final    Comment: (NOTE) SARS-CoV-2 target nucleic acids are NOT DETECTED.  The SARS-CoV-2 RNA is generally detectable in upper and lower respiratory specimens during the acute phase of infection. Negative results do not preclude SARS-CoV-2 infection, do not rule out co-infections with other pathogens, and should not be used as the sole basis for treatment or other patient management decisions. Negative results must be combined with clinical observations, patient history, and epidemiological information. The expected result is Negative.  Fact Sheet for Patients: SugarRoll.be  Fact Sheet for Healthcare  Providers: https://www.woods-mathews.com/  This test is not yet approved or cleared by the [place] FDA and  has been authorized for detection and/or diagnosis of SARS-CoV-2 by FDA under an Emergency Use Authorization (EUA). This EUA will remain  in effect (meaning this test can be used)  for the duration of the COVID-19 declaration under Se ction 564(b)(1) of the Act, 21 U.S.C. section 360bbb-3(b)(1), unless the authorization is terminated or revoked sooner.  Performed at [facility], [place] [address], [place], [place] [address]   Resp Panel by RT-PCR (Flu A&B, Covid) Nasopharyngeal Swab     Status: None   Collection Time: 04/08/20 10:28 AM   Specimen: Nasopharyngeal Swab; Nasopharyngeal(NP) swabs in vial transport medium  Result Value Ref Range Status   SARS Coronavirus 2 by RT PCR NEGATIVE NEGATIVE Final    Comment: (NOTE) SARS-CoV-2 target nucleic acids are NOT DETECTED.  The SARS-CoV-2 RNA is generally detectable in upper respiratory specimens during the acute phase of infection. The lowest concentration of SARS-CoV-2 viral copies this assay can detect is 138 copies/mL. A negative result does not preclude SARS-Cov-2 infection and should not be used as the sole basis for treatment or other patient management decisions. A negative result may occur with  improper specimen collection/handling, submission of specimen other than nasopharyngeal swab, presence of viral mutation(s) within the areas targeted by this assay, and inadequate number of viral copies(<138 copies/mL). A negative result must be combined with clinical observations, patient history, and epidemiological information. The expected result is Negative.  Fact Sheet for Patients:  EntrepreneurPulse.com.au  Fact Sheet for Healthcare Providers:  IncredibleEmployment.be  This test is no t yet approved or cleared by the [place] FDA and  has been authorized  for detection and/or diagnosis of SARS-CoV-2 by FDA under an Emergency Use Authorization (EUA). This EUA will remain  in effect (meaning this test can be used) for the duration of the COVID-19 declaration under Section 564(b)(1) of the Act, 21 U.S.C.section 360bbb-3(b)(1), unless the authorization is terminated  or revoked sooner.       Influenza A by PCR NEGATIVE NEGATIVE Final   Influenza B by PCR NEGATIVE NEGATIVE Final    Comment: (NOTE) The Xpert Xpress SARS-CoV-2/FLU/RSV plus assay is intended as an aid in the diagnosis of influenza from Nasopharyngeal swab specimens and should not be used as a sole basis for treatment. Nasal washings and aspirates are unacceptable for Xpert Xpress SARS-CoV-2/FLU/RSV testing.  Fact Sheet for Patients: EntrepreneurPulse.com.au  Fact Sheet for Healthcare Providers: IncredibleEmployment.be  This test is not yet approved or cleared by the [place] FDA and has been authorized for detection and/or diagnosis of SARS-CoV-2 by FDA under an Emergency Use Authorization (EUA). This EUA will remain in effect (meaning this test can be used) for the duration of the COVID-19 declaration under Section 564(b)(1) of the Act, 21 U.S.C. section 360bbb-3(b)(1), unless the authorization is terminated or revoked.  Performed at [facility], [place] [address], [place],  [address]      Radiology Studies: DG Tibia/Fibula Left  Result Date: 04/08/2020 CLINICAL DATA:  Fall.  Laceration.  Pain. EXAM: LEFT TIBIA AND FIBULA - 2 VIEW COMPARISON:  No recent. FINDINGS: Soft tissue bandage noted over the left lower extremity. Small amount of soft tissue air may be present consistent with the patient's history. No radiopaque foreign body. Soft tissue swelling. No acute bony or joint abnormality identified. No evidence of fracture or dislocation. Peripheral vascular calcification. IMPRESSION: 1. Soft tissue bandage noted.  Small amount of soft tissue air may be present consistent with the patient's history. No radiopaque foreign body noted. Soft tissue swelling. No acute bony or joint abnormality. 2.  Peripheral vascular disease. Electronically Signed   By: Marcello Moores  Register   On: 04/08/2020 07:41  LOS: 0 days   Antonieta Pert, MD [facility]  04/09/2020, 11:53 AM

## 2020-04-09 NOTE — Progress Notes (Addendum)
Progress Note  Patient Name: [name] Date of Encounter: 04/09/2020  [facility] Cardiologist: Sinclair Grooms, MD   Subjective   Feeling poorly this morning. Thinks he didn't sleep well overnight. More SOB today which he attributes to missing doses of his COPD regimen yesterday. No significant chest pain or palpitations.  Inpatient Medications    Scheduled Meds: . alfuzosin  10 mg Oral Q breakfast  . aspirin EC  81 mg Oral Daily  . atorvastatin  20 mg Oral QHS  . Chlorhexidine Gluconate Cloth  6 each Topical Q0600  . docusate sodium  100 mg Oral BID  . doxycycline  50 mg Oral Q12H  . enoxaparin (LOVENOX) injection  30 mg Subcutaneous Daily  . finasteride  5 mg Oral Q lunch  . fluticasone  2 spray Each Nare Daily  . guaiFENesin  600 mg Oral Daily  . mometasone-formoterol  2 puff Inhalation BID  . pantoprazole  40 mg Oral QAC breakfast  . predniSONE  10 mg Oral Daily  . sertraline  25 mg Oral Daily  . sodium chloride  1 drop Both Eyes QID  . sodium chloride flush  3 mL Intravenous Q12H  . Tiotropium Bromide Monohydrate  2 puff Inhalation Daily   Continuous Infusions: . amiodarone 30 mg/hr (04/09/20 0347)   PRN Meds: acetaminophen **OR** acetaminophen, ALPRAZolam, bisacodyl, hydrALAZINE, HYDROcodone-acetaminophen, levalbuterol, ondansetron **OR** ondansetron (ZOFRAN) IV, Polyethyl Glycol-Propyl Glycol, polyethylene glycol   Vital Signs    Vitals:   04/09/20 0200 04/09/20 0300 04/09/20 0500 04/09/20 0739  BP: (!) 90/47 (!) 81/50 104/67 130/62  Pulse: 73 65 80 80  Resp:   19 18  Temp:   98.3 F (36.8 C) (!) 97.4 F (36.3 C)  TempSrc:   Axillary Axillary  SpO2: 100% 100% 99% 100%  Weight:      Height:        Intake/Output Summary (Last 24 hours) at 04/09/2020 0823 Last data filed at 04/09/2020 0500 Gross per 24 hour  Intake 941.16 ml  Output 600 ml  Net 341.16 ml   Last 3 Weights 04/08/2020 04/08/2020 04/06/2020  Weight (lbs) 121 lb 14.6 oz 118 lb 9.7 oz  118 lb 8 oz  Weight (kg) 55.3 kg 53.8 kg 53.751 kg      Telemetry    Atrial fibrillation with RVR with conversion to sinus rhythm around 7pm 04/08/20.  - Personally Reviewed  ECG    No new tracings - Personally Reviewed  Physical Exam   GEN: Thin chronically ill appearing gentleman sitting upright in bed in NAD  Neck: No JVD Cardiac: RRR, no murmurs, rubs, or gallops.  Respiratory: Clear to auscultation bilaterally. GI: Soft, nontender, non-distended  MS: 2+ LE edema with pitting up to thighs; LLE laceration with dressing in place - C/D/I; s/p R AKA. Neuro:  Nonfocal  Psych: Normal affect   Labs    High Sensitivity Troponin:  No results for input(s): TROPONINIHS in the last 720 hours.    Chemistry Recent Labs  Lab 04/06/20 1553 04/08/20 0640 04/09/20 0612  NA 132* 134* 132*  K 3.4* 3.2* 4.6  CL 91* 93* 94*  CO2 32 34* 30  GLUCOSE 142* 103* 94  BUN 22 25* 22  CREATININE 1.07 1.38* 1.23  CALCIUM 8.9 8.9 8.9  GFRNONAA >60 49* 56*  ANIONGAP 9 7 8      Hematology Recent Labs  Lab 04/08/20 0640 04/08/20 1653 04/09/20 0612  WBC 7.3 8.4 7.5  RBC 2.83* 2.59* 2.55*  HGB 8.7* 8.0* 7.9*  HCT 27.3* 25.4* 24.8*  MCV 96.5 98.1 97.3  MCH 30.7 30.9 31.0  MCHC 31.9 31.5 31.9  RDW 12.8 13.0 13.0  PLT 196 170 160    BNPNo results for input(s): BNP, PROBNP in the last 168 hours.   DDimer No results for input(s): DDIMER in the last 168 hours.   Radiology    DG Tibia/Fibula Left  Result Date: 04/08/2020 CLINICAL DATA:  Fall.  Laceration.  Pain. EXAM: LEFT TIBIA AND FIBULA - 2 VIEW COMPARISON:  No recent. FINDINGS: Soft tissue bandage noted over the left lower extremity. Small amount of soft tissue air may be present consistent with the patient's history. No radiopaque foreign body. Soft tissue swelling. No acute bony or joint abnormality identified. No evidence of fracture or dislocation. Peripheral vascular calcification. IMPRESSION: 1. Soft tissue bandage noted. Small  amount of soft tissue air may be present consistent with the patient's history. No radiopaque foreign body noted. Soft tissue swelling. No acute bony or joint abnormality. 2.  Peripheral vascular disease. Electronically Signed   By: Marcello Moores  Register   On: 04/08/2020 07:41    Cardiac Studies   ECHOIMPRESSIONS1/2022  1. Left ventricular ejection fraction, by estimation, is 60 to 65%. The  left ventricle has normal function. The left ventricle has no regional  wall motion abnormalities. There is mild concentric left ventricular  hypertrophy. Left ventricular diastolic  parameters are indeterminate.  2. Right ventricular systolic function is normal. The right ventricular  size is normal.  3. The mitral valve is normal in structure. No evidence of mitral valve  regurgitation. No evidence of mitral stenosis.  4. The aortic valve is tricuspid. There is mild calcification of the  aortic valve. There is mild thickening of the aortic valve. Aortic valve  regurgitation is not visualized. Mild aortic valve sclerosis is present,  with no evidence of aortic valve  stenosis.  5. The inferior vena cava is normal in size with greater than 50%  respiratory variability, suggesting right atrial pressure of 3 mmHg  Patient Profile     87 y.o. male with a PMH of CAD s/p CABG in 2003, PVD s/p right AKA 08/2019 with revision 01/2020, carotid artery disease, chronic diastolic CHF, HTN, HLD, paroxysmal atrial fibrillation, COPD on home O2, and recent TRUBT 04/06/20, who is being followed by cardiology for the evaluation of atrial flutter   Assessment & Plan    1. Paroxysmal atrial fibrillation/flutter: patient presented after a fall from bed. Noted to be tachycardic and mildly hypotensive on EMS arrival. Found to be in atrial flutter with RVR on EKG on arrival to ED. He was started on a diltiazem gtt with conversion to sinus rhythm. Dilt gtt stopped. Cardiology asked to see. Anticoagulation initially  deferred following his initial episode post-op after AKA revision surgery. Likely this episode was triggered by recent TURBT. CHA2DS2-VASc Score = 6 [CHF History: Yes, HTN History: Yes, Diabetes History: Yes, Stroke History: No, Vascular Disease History: Yes, Age Score: 2, Gender Score: 0].  Therefore, the patient's annual risk of stroke is 9.7 %. Unfortunately he is likely high risk for anticoagulation given anemia, falls, and comorbidities. He was started on amiodarone yesterday with resolution of atrial fib/flutter around 7pm.  - Will transition to po amiodarone 200mg  BID with plans to reduce dose to 200mg  daily in 2 weeks.  - Actuary at discharge given soft blood pressures   2. CAD s/p CABG: No anginal complaints - Continue aspirin  3. Chronic respiratory failure in patient with COPD and chronic diastolic CHF: reports mildly increased SOB and chest congestion following TRUBT. Lungs sound clear, though does have 1-2+ pitting in LLE up to his thigh.  - Would restart home lasix - Continue pulmonary meds per primary team  4. HTN: BP on the soft side.  - Continue BBlocker and lasix as BP will allow  5. PVD: s/p right AKA 08/2019 with revision 01/2020. Now with LLE laceration repaired with 14 sutures. High risk for poor healing - Continue aspirin - Continue close monitoring of wound   6. Bladder tumor: s/p TURBT 04/06/20 with evidence of malignancy on pathology.  - Continue management per primary team and urology  Would benefit from ongoing palliative care outpatient. Post hospital follow-up has been arranged. AVS updated     For questions or updates, please contact [place] Please consult [email or web address] for contact info under        Signed, Abigail Butts, PA-C  04/09/2020, 8:23 AM     History and all data above reviewed.  Patient examined.  I agree with the findings as above. He is groggy sitting on the side of the bed. Generalized bone pain.  Now back in  NSR.  Still with SOB.    The patient exam reveals COR:RRR  ,  Lungs: Decreased breath sounds  ,  Abd: Positive bowel sounds, no rebound no guarding, Ext:  Mild right ankle edema, wrapped  .  All available labs, radiology testing, previous records reviewed. Agree with documented assessment and plan.   CHF:  Agree with resuming PO Lasix.  Atrial Fib:  Now in NSR and changing to PO amiodarone.    Jeneen Rinks [facility]  10:39 AM  04/09/2020

## 2020-04-10 DIAGNOSIS — I480 Paroxysmal atrial fibrillation: Secondary | ICD-10-CM | POA: Diagnosis not present

## 2020-04-10 DIAGNOSIS — I5032 Chronic diastolic (congestive) heart failure: Secondary | ICD-10-CM

## 2020-04-10 DIAGNOSIS — I10 Essential (primary) hypertension: Secondary | ICD-10-CM | POA: Diagnosis not present

## 2020-04-10 LAB — CBC
HCT: 22 % — ABNORMAL LOW (ref 39.0–52.0)
Hemoglobin: 7.6 g/dL — ABNORMAL LOW (ref 13.0–17.0)
MCH: 31.3 pg (ref 26.0–34.0)
MCHC: 34.5 g/dL (ref 30.0–36.0)
MCV: 90.5 fL (ref 80.0–100.0)
Platelets: 122 10*3/uL — ABNORMAL LOW (ref 150–400)
RBC: 2.43 MIL/uL — ABNORMAL LOW (ref 4.22–5.81)
RDW: 12.8 % (ref 11.5–15.5)
WBC: 6.8 10*3/uL (ref 4.0–10.5)
nRBC: 0 % (ref 0.0–0.2)

## 2020-04-10 LAB — IRON AND TIBC
Iron: 28 ug/dL — ABNORMAL LOW (ref 45–182)
Saturation Ratios: 10 % — ABNORMAL LOW (ref 17.9–39.5)
TIBC: 290 ug/dL (ref 250–450)
UIBC: 262 ug/dL

## 2020-04-10 LAB — RETICULOCYTES
Immature Retic Fract: 3.5 % (ref 2.3–15.9)
RBC.: 2.26 MIL/uL — ABNORMAL LOW (ref 4.22–5.81)
Retic Count, Absolute: 21.7 10*3/uL (ref 19.0–186.0)
Retic Ct Pct: 1 % (ref 0.4–3.1)

## 2020-04-10 LAB — BASIC METABOLIC PANEL
Anion gap: 6 (ref 5–15)
BUN: 20 mg/dL (ref 8–23)
CO2: 32 mmol/L (ref 22–32)
Calcium: 8.8 mg/dL — ABNORMAL LOW (ref 8.9–10.3)
Chloride: 91 mmol/L — ABNORMAL LOW (ref 98–111)
Creatinine, Ser: 1.1 mg/dL (ref 0.61–1.24)
GFR, Estimated: >60 mL/min (ref >60)
Glucose, Bld: 95 mg/dL (ref 70–99)
Potassium: 4.1 mmol/L (ref 3.5–5.1)
Sodium: 129 mmol/L — ABNORMAL LOW (ref 135–145)

## 2020-04-10 LAB — VITAMIN B12: Vitamin B-12: 1104 pg/mL — ABNORMAL HIGH (ref 180–914)

## 2020-04-10 LAB — FOLATE: Folate: 8.9 ng/mL (ref >5.9)

## 2020-04-10 LAB — FERRITIN: Ferritin: 56 ng/mL (ref 24–336)

## 2020-04-10 MED ORDER — AMIODARONE HCL 200 MG PO TABS: 200.0000 mg | ORAL_TABLET | Freq: Two times a day (BID) | ORAL | 0 refills | Status: DC

## 2020-04-10 MED ORDER — AMIODARONE HCL 200 MG PO TABS: 200.0000 mg | ORAL_TABLET | Freq: Every day | ORAL | 0 refills | Status: DC

## 2020-04-10 NOTE — Evaluation (Signed)
Physical Therapy Evaluation Patient Details Name: [name] MRN: [id number] DOB: December 17, 1929 Today's Date: 04/10/2020   History of Present Illness  87 year old male admitted s/p fall from bed. He woke up during the night and forgot that he had an amputation and fell. In the ED initially he was tachycardic hypotensive and was in a flutter.  He has chronic shortness of breath. Pt with multiple complex comorbidities with PVD status post right AKA (revision 1/22) COPD on 2L home oxygen A. fib CAD history of CABG, HTN, HLD history of bladder surgery and tumor resection (3/22)  Clinical Impression  PTA, patient lives with wife and reports SBA for transfers w/c<>bed<>BSC. Patient is significantly HOH. Patient requires min guard for squat pivot transfer this session. Educated patient and wife about endurance with returning home via car as patient will have to transfer 3-4 more times, wife states they will be fine. Wife unable to provide physical assistance if patient were to need for transfers. Patient will benefit from skilled PT services during acute stay to address listed deficits. Recommend HHPT following discharge as patient/family refusing SNF.     Follow Up Recommendations Home health PT;Supervision/Assistance - 24 hour    Equipment Recommendations  None recommended by PT    Recommendations for Other Services       Precautions / Restrictions Precautions Precautions: Fall Precaution Comments: fragile skin/risk for skin tears; laceration LLE Required Braces or Orthoses: Other Brace Other Brace: L adapted shoe      Mobility  Bed Mobility               General bed mobility comments: Sitting EOB on arrival    Transfers Overall transfer level: Needs assistance Equipment used: None Transfers: Squat Pivot Transfers     Squat pivot transfers: Min guard     General transfer comment: min guard for safety. Patient able to perform with no physical assist required towards the  R  Ambulation/Gait                Stairs            Wheelchair Mobility    Modified Rankin (Stroke Patients Only)       Balance Overall balance assessment: Needs assistance Sitting-balance support: No upper extremity supported Sitting balance-Leahy Scale: Fair                                       Pertinent Vitals/Pain Pain Assessment: No/denies pain Pain Intervention(s): Monitored during session    Home Living Family/patient expects to be discharged to:: Private residence Living Arrangements: Spouse/significant other Available Help at Discharge: Available 24 hours/day (elderly spouse - unable to provide physical assistance) Type of Home: House Home Access: Level entry     Home Layout: Multi-level;Bed/bath upstairs Home Equipment: [name] - 2 wheels;Cane - single point;Shower seat;Transport chair;Tub bench Additional Comments: O2 2L, tub bench, drop arm BSC, stair lift    Prior Function Level of Independence: Needs assistance   Gait / Transfers Assistance Needed: S with transfer @ wc level; uses drop arm BSC. wife states he goes from w/c-bed-bsc, then from BSC-bed-w/c. States he likes this sequence because it makes him feel safe. At [place] able to come to standing at the sink  ADL's / Homemaking Assistance Needed: pt does @ 50% of his bahting/dressing  Comments: Receiving HHPT     Hand Dominance   Dominant Hand: Right  Extremity/Trunk Assessment   Upper Extremity Assessment Upper Extremity Assessment: Defer to OT evaluation    Lower Extremity Assessment Lower Extremity Assessment: RLE deficits/detail;Generalized weakness RLE Deficits / Details: R AKA    Cervical / Trunk Assessment Cervical / Trunk Assessment: Kyphotic (forward head)  Communication   Communication: HOH (significantly)  Cognition Arousal/Alertness: Awake/alert Behavior During Therapy: WFL for tasks assessed/performed Overall Cognitive Status:  Impaired/Different from baseline Area of Impairment: Attention;Safety/judgement;Awareness;Problem solving                   Current Attention Level: Selective     Safety/Judgement: Decreased awareness of safety;Decreased awareness of deficits Awareness: Emergent Problem Solving: Slow processing        General Comments      Exercises     Assessment/Plan    PT Assessment Patient needs continued PT services  PT Problem List Decreased strength;Decreased activity tolerance;Decreased balance;Decreased mobility;Decreased cognition;Decreased safety awareness       PT Treatment Interventions DME instruction;Functional mobility training;Therapeutic activities;Balance training;Therapeutic exercise;Patient/family education;Wheelchair mobility training    PT Goals (Current goals can be found in the Care Plan section)  Acute Rehab PT Goals Patient Stated Goal: to come up with a plan PT Goal Formulation: With patient/family Time For Goal Achievement: 04/24/20 Potential to Achieve Goals: Fair    Frequency Min 3X/week   Barriers to discharge        Co-evaluation               AM-PAC PT "6 Clicks" Mobility  Outcome Measure Help needed turning from your back to your side while in a flat bed without using bedrails?: A Little Help needed moving from lying on your back to sitting on the side of a flat bed without using bedrails?: A Little Help needed moving to and from a bed to a chair (including a wheelchair)?: A Little Help needed standing up from a chair using your arms (e.g., wheelchair or bedside chair)?: A Lot Help needed to walk in hospital room?: Total Help needed climbing 3-5 steps with a railing? : Total 6 Click Score: 13    End of Session Equipment Utilized During Treatment: Gait belt;Oxygen Activity Tolerance: Patient tolerated treatment well Patient left: in chair;with call bell/phone within reach;with chair alarm set;with family/visitor present Nurse  Communication: Mobility status PT Visit Diagnosis: Unsteadiness on feet (R26.81);Muscle weakness (generalized) (M62.81);History of falling (Z91.81);Other abnormalities of gait and mobility (R26.89)    Time: 5110-2111 PT Time Calculation (min) (ACUTE ONLY): 23 min   Charges:   PT Evaluation $PT Eval Moderate Complexity: 1 Mod PT Treatments $Therapeutic Activity: 8-22 mins        Alona Danford A. Gilford Rile PT, DPT Acute Rehabilitation Services Pager [phone] Office [phone]   Linna Hoff 04/10/2020, 1:10 PM

## 2020-04-10 NOTE — Discharge Instructions (Signed)
Atrial Flutter  Atrial flutter is a type of abnormal heart rhythm (arrhythmia). The heart has an electrical system that tells it how to beat. In atrial flutter, the signals move rapidly in the top chambers of the heart (the atria). This makes your heart beat very fast. Atrial flutter can come and go, or it can be permanent. The goal of treatment is to prevent blood clots from forming, control your heart rate, or restore your heartbeat to a normal rhythm. If this condition is not treated, it can cause serious problems, such as a weakened heart muscle (cardiomyopathy) or a stroke. What are the causes? This condition is often caused by conditions that damage the heart's electrical system. These include:  Heart conditions and heart surgery. These include heart attacks and open-heart surgery.  Lung problems, such as COPD or a blood clot in the lung (pulmonary embolism, or PE).  Poorly controlled high blood pressure (hypertension).  Overactive thyroid (hyperthyroidism).  Diabetes. In some cases, the cause of this condition is not known. What increases the risk? You are more likely to develop this condition if:  You are an elderly adult.  You are a man.  You are overweight (obese).  You have obstructive sleep apnea.  You have a family history of atrial flutter.  You have diabetes.  You drink a lot of alcohol, especially binge drinking.  You use drugs, including cannabis.  You smoke. What are the signs or symptoms? Symptoms of this condition include:  A feeling that your heart is pounding or racing (palpitations).  Shortness of breath.  Chest pain.  Feeling dizzy or light-headed.  Fainting.  Low blood pressure (hypotension).  Fatigue.  Tiring easily during exercise or activity. In some cases, there are no symptoms. How is this diagnosed? This condition may be diagnosed with:  An electrocardiogram (ECG) to check electrical signals of the heart.  An ambulatory  cardiac monitor to record your heart's activity for a few days.  An echocardiogram to create pictures of your heart.  A transesophageal echocardiogram (TEE) to create even better pictures of your heart.  A stress test to check your blood supply while you exercise.  Imaging tests, such as a CT scan or chest X-ray.  Blood tests. How is this treated? Treatment depends on underlying conditions and how you feel when you experience atrial flutter. This condition may be treated with:  Medicines to prevent blood clots or to treat heart rate or heart rhythm problems.  Electrical cardioversion to reset the heart's rhythm.  Ablation to remove the heart tissue that sends abnormal signals.  Left atrial appendage closure to seal the area where blood clots can form. In some cases, underlying conditions will be treated. Follow these instructions at home: Medicines  Take over-the-counter and prescription medicines only as told by your health care provider.  Do not take any new medicines without talking to your health care provider.  If you are taking blood thinners: ? Talk with your health care provider before you take any medicines that contain aspirin or NSAIDs, such as ibuprofen. These medicines increase your risk for dangerous bleeding. ? Take your medicine exactly as told, at the same time every day. ? Avoid activities that could cause injury or bruising, and follow instructions about how to prevent falls. ? Wear a medical alert bracelet or carry a card that lists what medicines you take. Lifestyle  Eat heart-healthy foods. Talk with a dietitian to make an eating plan that is right for you.  Do  not use any products that contain nicotine or tobacco, such as cigarettes, e-cigarettes, and chewing tobacco. If you need help quitting, ask your health care provider.  Do not drink alcohol.  Do not use drugs, including cannabis.  Lose weight if you are overweight or obese.  Exercise  regularly as instructed by your health care provider. General instructions  Do not use diet pills unless your health care provider approves. Diet pills may make heart problems worse.  If you have obstructive sleep apnea, manage your condition as told by your health care provider.  Keep all follow-up visits as told by your health care provider. This is important. Contact a health care provider if you:  Notice a change in the rate, rhythm, or strength of your heartbeat.  Are taking a blood thinner and you notice more bruising.  Have a sudden change in weight.  Tire more easily when you exercise or do heavy work. Get help right away if you have:  Pain or pressure in your chest.  Shortness of breath.  Fainting.  Increasing sweating with no known cause.  Side effects of blood thinners, such as blood in your vomit, stool, or urine, or bleeding that cannot stop.  Any symptoms of a stroke. "BE FAST" is an easy way to remember the main warning signs of a stroke: ? B - Balance. Signs are dizziness, sudden trouble walking, or loss of balance. ? E - Eyes. Signs are trouble seeing or a sudden change in vision. ? F - Face. Signs are sudden weakness or numbness of the face, or the face or eyelid drooping on one side. ? A - Arms. Signs are weakness or numbness in an arm. This happens suddenly and usually on one side of the body. ? S - Speech. Signs are sudden trouble speaking, slurred speech, or trouble understanding what people say. ? T - Time. Time to call emergency services. Write down what time symptoms started.  Other signs of a stroke, such as: ? A sudden, severe headache with no known cause. ? Nausea or vomiting. ? Seizure.  These symptoms may represent a serious problem that is an emergency. Do not wait to see if the symptoms will go away. Get medical help right away. Call your local emergency services (911 in the [place] Do not drive yourself to the hospital. Summary  Atrial  flutter is an abnormal heart rhythm that can give you symptoms of palpitations, shortness of breath, or fatigue.  Atrial flutter is often treated with medicines to keep your heart in a normal rhythm and to prevent a stroke.  Get help right away if you cannot catch your breath, or have chest pain or pressure.  Get help right away if you have signs or symptoms of a stroke. This information is not intended to replace advice given to you by your health care provider. Make sure you discuss any questions you have with your health care provider. Document Revised: 06/19/2018 Document Reviewed: 06/19/2018 Elsevier Patient Education  [place].

## 2020-04-10 NOTE — Care Management (Signed)
    Durable Medical Equipment  (From admission, onward)         Start     Ordered   04/10/20 1043  For home use only DME Hospital bed  Once       Comments: Please call wife [name] [phone]  Question Answer Comment  Length of Need Lifetime   Patient has (list medical condition): LLE laceration status post repair, CHF   The above medical condition requires: Patient requires the ability to reposition frequently   Head must be elevated greater than: 45 degrees   Bed type Semi-electric   Support Surface: Gel Overlay      04/10/20 1043

## 2020-04-10 NOTE — Progress Notes (Signed)
Progress Note  Patient Name: [name] Date of Encounter: 04/10/2020  [facility] Cardiologist: Sinclair Grooms, MD   Subjective   Felt poorly today, had brief Afib rates 100-120, back in sinus rhythm.  Inpatient Medications    Scheduled Meds: . alfuzosin  10 mg Oral Q breakfast  . amiodarone  200 mg Oral BID  . [START ON 04/23/2020] amiodarone  200 mg Oral Daily  . aspirin EC  81 mg Oral Daily  . atorvastatin  20 mg Oral QHS  . Chlorhexidine Gluconate Cloth  6 each Topical Q0600  . docusate sodium  100 mg Oral BID  . doxycycline  50 mg Oral Q12H  . enoxaparin (LOVENOX) injection  40 mg Subcutaneous Daily  . finasteride  5 mg Oral Q lunch  . fluticasone  2 spray Each Nare Daily  . furosemide  80 mg Oral Daily  . guaiFENesin  600 mg Oral Daily  . mometasone-formoterol  2 puff Inhalation BID  . pantoprazole  40 mg Oral QAC breakfast  . predniSONE  10 mg Oral Daily  . sertraline  25 mg Oral Daily  . sodium chloride  1 drop Both Eyes QID  . sodium chloride flush  3 mL Intravenous Q12H  . Tiotropium Bromide Monohydrate  2 puff Inhalation Daily   Continuous Infusions:  PRN Meds: acetaminophen **OR** acetaminophen, ALPRAZolam, bisacodyl, hydrALAZINE, HYDROcodone-acetaminophen, levalbuterol, ondansetron **OR** ondansetron (ZOFRAN) IV, Polyethyl Glycol-Propyl Glycol, polyethylene glycol   Vital Signs    Vitals:   04/10/20 0011 04/10/20 0500 04/10/20 0736 04/10/20 1224  BP: 134/80 (!) 103/55 (!) 125/56 102/68  Pulse: 84 80 88 95  Resp: 19 20 17 16   Temp: 98.2 F (36.8 C) 98.2 F (36.8 C) 98.1 F (36.7 C) 97.7 F (36.5 C)  TempSrc: Oral Oral Oral Oral  SpO2: 100% 100% 100% 100%  Weight:      Height:        Intake/Output Summary (Last 24 hours) at 04/10/2020 1334 Last data filed at 04/10/2020 0957 Gross per 24 hour  Intake 810.9 ml  Output 2150 ml  Net -1339.1 ml   Last 3 Weights 04/08/2020 04/08/2020 04/06/2020  Weight (lbs) 121 lb 14.6 oz 118 lb 9.7 oz 118 lb  8 oz  Weight (kg) 55.3 kg 53.8 kg 53.751 kg      Telemetry    Atrial fibrillation with RVR and SR  - Personally Reviewed  ECG    No new tracings - Personally Reviewed  Physical Exam   GEN: thin, frail, wearing supp O2  Neck: No JVD Cardiac: RRR, no murmurs, rubs, or gallops.  Respiratory: Clear to auscultation bilaterally. GI: Soft, nontender, non-distended  MS: LLE dressed. Neuro:  Nonfocal  Psych: Normal affect     Labs    High Sensitivity Troponin:  No results for input(s): TROPONINIHS in the last 720 hours.    Chemistry Recent Labs  Lab 04/08/20 0640 04/09/20 0612 04/10/20 0237  NA 134* 132* 129*  K 3.2* 4.6 4.1  CL 93* 94* 91*  CO2 34* 30 32  GLUCOSE 103* 94 95  BUN 25* 22 20  CREATININE 1.38* 1.23 1.10  CALCIUM 8.9 8.9 8.8*  GFRNONAA 49* 56* >60  ANIONGAP 7 8 6      Hematology Recent Labs  Lab 04/08/20 1653 04/09/20 0612 04/10/20 0237  WBC 8.4 7.5 6.8  RBC 2.59* 2.55* 2.43*  2.26*  HGB 8.0* 7.9* 7.6*  HCT 25.4* 24.8* 22.0*  MCV 98.1 97.3 90.5  MCH 30.9  31.0 31.3  MCHC 31.5 31.9 34.5  RDW 13.0 13.0 12.8  PLT 170 160 122*    BNPNo results for input(s): BNP, PROBNP in the last 168 hours.   DDimer No results for input(s): DDIMER in the last 168 hours.   Radiology    No results found.  Cardiac Studies   ECHOIMPRESSIONS1/2022  1. Left ventricular ejection fraction, by estimation, is 60 to 65%. The  left ventricle has normal function. The left ventricle has no regional  wall motion abnormalities. There is mild concentric left ventricular  hypertrophy. Left ventricular diastolic  parameters are indeterminate.  2. Right ventricular systolic function is normal. The right ventricular  size is normal.  3. The mitral valve is normal in structure. No evidence of mitral valve  regurgitation. No evidence of mitral stenosis.  4. The aortic valve is tricuspid. There is mild calcification of the  aortic valve. There is mild thickening of  the aortic valve. Aortic valve  regurgitation is not visualized. Mild aortic valve sclerosis is present,  with no evidence of aortic valve  stenosis.  5. The inferior vena cava is normal in size with greater than 50%  respiratory variability, suggesting right atrial pressure of 3 mmHg  Patient Profile     87 y.o. male with a PMH of CAD s/p CABG in 2003, PVD s/p right AKA 08/2019 with revision 01/2020, carotid artery disease, chronic diastolic CHF, HTN, HLD, paroxysmal atrial fibrillation, COPD on home O2, and recent TRUBT 04/06/20, who is being followed by cardiology for the evaluation of atrial flutter   Assessment & Plan    1. Paroxysmal atrial fibrillation/flutter: patient presented after a fall from bed. Noted to be tachycardic and mildly hypotensive on EMS arrival. Found to be in atrial flutter with RVR on EKG on arrival to ED. He was started on a diltiazem gtt with conversion to sinus rhythm. Dilt gtt stopped. Cardiology asked to see. Anticoagulation initially deferred following his initial episode post-op after AKA revision surgery. Likely this episode was triggered by recent TURBT. CHA2DS2-VASc Score = 6 [CHF History: Yes, HTN History: Yes, Diabetes History: Yes, Stroke History: No, Vascular Disease History: Yes, Age Score: 2, Gender Score: 0].  Therefore, the patient's annual risk of stroke is 9.7 %. Unfortunately he is likely high risk for anticoagulation given anemia, falls, and comorbidities. He was started on amiodarone and now is having only intermittent AF. - On po amiodarone 200mg  BID with plans to reduce dose to 200mg  daily in 2 weeks.  - Actuary at discharge given soft blood pressures   2. CAD s/p CABG: No anginal complaints - Continue aspirin  3. Chronic respiratory failure in patient with COPD and chronic diastolic CHF: reports mildly increased SOB and chest congestion following surgery. Lungs sound clear - Resume home lasix, he takes it 40 mg po BID. -  Continue pulmonary meds per primary team  4. HTN: BP on the soft side.  - cont lasix as BP will allow - hold BB til follow up.  5. PVD: s/p right AKA 08/2019 with revision 01/2020. Now with LLE laceration repaired with 14 sutures. High risk for poor healing - Continue aspirin - Continue close monitoring of wound   6. Bladder tumor: s/p TURBT 04/06/20 with evidence of malignancy on pathology.  - Continue management per primary team and urology  For questions or updates, please contact [place] Please consult [email or web address] for contact info under   Total time of encounter: 35 minutes total time of  encounter, including 20 minutes spent in face-to-face patient care on the date of this encounter. This time includes coordination of care and counseling regarding above mentioned problem list. Remainder of non-face-to-face time involved reviewing chart documents/testing relevant to the patient encounter and documentation in the medical record. Discussed care with wife who is at the bedside.  Cherlynn Kaiser, MD, [facility]  [facility]      Signed, Elouise Munroe, MD  04/10/2020, 1:34 PM

## 2020-04-10 NOTE — TOC Initial Note (Addendum)
Transition of Care [facility]) - Initial/Assessment Note    Patient Details  Name: [name] MRN: [id number] Date of Birth: March 24, 1929  Transition of Care [facility]) CM/SW Contact:    Marilu Favre, RN Phone Number: 04/10/2020, 11:00 AM  Clinical Narrative:                  Spoke to patient wife [name] [phone]. They prefer to return home with continued services with [facility] home [facility].  PTA active for HHRN,PT,OT.   Requesting hospital bed. OT did recommend hoyer lift also. Wife decided at this time she does not want hoyer lift. If she changes her mind she will discuss with HHPT/OT.  Wife plans to transport patient home today in private car, she has portable oxygen tank (he has home oxygen through [place]). She does not want bed delivered today.  PT concerned about patient discharging home via car. [name] believes he will be fine. NCM left PTAR paperwork at [place] in case patient ends up needing ambulance transportation. Nurse can call.   NCM ordered hospital bed through [place] , they will call patient's wife to arrange delivery.   Patient has cane and walker at home already.    Amy with [facility] aware of discharge today. Orders entered.  Confirmed face sheet information.  Expected Discharge Plan: [place] Barriers to Discharge: No Barriers Identified   Patient Goals and CMS Choice Patient states their goals for this hospitalization and ongoing recovery are:: to return to home CMS Medicare.gov Compare Post Acute Care list provided to:: Patient Choice offered to / list presented to : [facility]  Expected Discharge Plan and Services Expected Discharge Plan: [place]   Discharge Planning Services: CM Consult Post Acute Care Choice: Home Health,Durable Medical Equipment Living arrangements for the past 2 months: Single Family Home Expected Discharge Date: 04/10/20               DME Arranged: Hospital  bed DME Agency: [employer] Date DME Agency Contacted: 04/10/20 Time DME Agency Contacted: 0932   [place]: PT,RN [place] Date [place]: 04/10/20 Time [place]: 1059 Representative spoke with at [place]: [place] Arrangements/Services Living arrangements for the past 2 months: [place] with:: Spouse Patient language and need for interpreter reviewed:: Yes Do you feel safe going back to the place where you live?: Yes      Need for Family Participation in Patient Care: Yes (Comment) Care giver support system in place?: Yes (comment) Current home services: DME Criminal Activity/Legal Involvement Pertinent to Current Situation/Hospitalization: No - Comment as needed  Activities of Daily Living Home Assistive Devices/Equipment: Wheelchair ADL Screening (condition at time of admission) Patient's cognitive ability adequate to safely complete daily activities?: No Is the patient deaf or have difficulty hearing?: Yes Does the patient have difficulty seeing, even when wearing glasses/contacts?: No Does the patient have difficulty concentrating, remembering, or making decisions?: Yes Patient able to express need for assistance with ADLs?: Yes Does the patient have difficulty dressing or bathing?: No Independently performs ADLs?: Yes (appropriate for developmental age) Communication: Independent Dressing (OT): Needs assistance Is this a change from baseline?: Pre-admission baseline Grooming: Needs assistance Feeding: Independent Bathing: Needs assistance Is this a change from baseline?: Pre-admission baseline Toileting: Needs assistance Is this a change from baseline?: Pre-admission baseline In/Out Bed: Needs assistance Is this a change from baseline?: Pre-admission baseline Walks  in Home: Needs assistance Is this a change from baseline?: Pre-admission baseline Does the patient have difficulty walking or  climbing stairs?: Yes Weakness of Legs: Left (right bka) Weakness of Arms/Hands: None  Permission Sought/Granted   Permission granted to share information with : Yes, Verbal Permission Granted  Share Information with NAME: Jacqueline [phone]  Permission granted to share info w AGENCY: [facility]  Permission granted to share info w Relationship: wife     Emotional Assessment              Admission diagnosis:  Atrial flutter with rapid ventricular response ([place]) [I48.92] Atrial fibrillation with RVR ([place]) [I48.91] Fall, initial encounter [W19.XXXA] Anemia, unspecified type [D64.9] Patient Active Problem List   Diagnosis Date Noted  . Atrial fibrillation with RVR ([place]) 04/08/2020  . Fall at home, initial encounter 04/08/2020  . Chronic pain 04/08/2020  . [place] 02/27/2020  . Chronic peripheral venous hypertension with lower extremity complication 83/38/2505  . Complete traumatic amputation at level between right hip and knee, initial encounter ([place]) 02/27/2020  . Difficulty in walking, not elsewhere classified 02/27/2020  . Dyspepsia 02/27/2020  . Foot ulcer ([place]) 02/27/2020  . Hyperglycemia 02/27/2020  . Iron deficiency anemia 02/27/2020  . Major depression single episode, in partial remission ([place]) 02/27/2020  . Malnutrition related to chronic disease ([place]) 02/27/2020  . Osteomyelitis ([place]) 02/27/2020  . Paresthesia 02/27/2020  . Right upper quadrant pain 02/27/2020  . Slow transit constipation 02/27/2020  . Spasm of bladder 02/27/2020  . Urinary tract obstruction 02/27/2020  . Vitamin D deficiency 02/27/2020  . Congestive heart failure ([place]) 02/27/2020  . Prediabetes   . Urinary retention   . Normocytic anemia   . Chronic diastolic congestive heart failure ([place])   . Major depressive disorder with current active episode   . Paroxysmal atrial fibrillation (HCC)   . Right above-knee amputee ([place]) 02/05/2020  . Typical atrial flutter ([place])   . New onset  atrial fibrillation ([place])   . Malnutrition of moderate degree 01/30/2020  . Cellulitis of left foot 01/24/2020  . Cellulitis and abscess of left lower extremity 01/24/2020  . Abnormality of gait 10/20/2019  . Pain of left heel 09/01/2019  . Labile blood pressure   . Anxiety state   . Hypothyroidism   . Right hip pain   . Sacral decubitus ulcer   . Post-operative pain   . Chronic systolic congestive heart failure ([place])   . Urinary retention due to benign prostatic hyperplasia 08/20/2019  . SOB (shortness of breath)   . Chronic systolic CHF (congestive heart failure) ([place])   . Hypoalbuminemia due to protein-calorie malnutrition ([place])   . Transaminitis   . Acute blood loss anemia   . Hyponatremia   . Postoperative pain   . Supplemental oxygen dependent   . S/P AKA (above knee amputation) ([place]) 08/14/2019  . Peripheral arterial disease ([place]) 08/11/2019  . Abnormal urinalysis 08/11/2019  . Critical lower limb ischemia ([place]) 08/11/2019  . DNR (do not resuscitate)   . Voiding difficulty   . Palliative care by specialist   . Pressure injury of skin 06/29/2019  . Cellulitis of left lower extremity 06/28/2019  . Cellulitis of right lower extremity 06/27/2019  . AKI (acute kidney injury) ([place]) 06/27/2019  . Headache 10/16/2018  . Severe major depression without psychotic features ([place]) 09/22/2017  . Generalized anxiety disorder 09/22/2017  . DNR (do not resuscitate) discussion 05/10/2017  . Weakness generalized 04/04/2017  . Chest pain 03/24/2017  . AV  block, [place] 1 03/24/2017  . CAD (coronary artery disease) 03/24/2017  . Chronic diastolic CHF (congestive heart failure) ([place]) 03/24/2017  . COPD (chronic obstructive pulmonary disease) ([place]) 03/24/2017  . NSTEMI (non-ST elevated myocardial infarction) ([place])   . Elevated troponin 03/09/2017  . Acute on chronic diastolic heart failure ([place])   . Tachycardia 03/08/2017  . Influenza A 02/04/2017  . Cor pulmonale, chronic (HCC)/  clinical dx  09/13/2016  . Orthostatic hypotension 09/26/2015  . Angina at rest [facility]) 09/24/2015  . BPH (benign prostatic hyperplasia) 09/13/2015  . GERD (gastroesophageal reflux disease) 09/13/2015  . Anxiety disorder 09/13/2015  . Sinusitis, chronic 09/09/2015  . DOE (dyspnea on exertion) 04/15/2015  . Acute heart failure ([place]) 03/18/2015  . Right calf pain 06/18/2014  . Coronary artery disease involving coronary bypass graft of native heart with angina pectoris ([place])   . Peripheral vascular disease ([place])   . Essential hypertension   . Obstructive sleep apnea   . Rhinitis, chronic 01/29/2007  . COPD   GOLD IV/ 02/steroid dep  01/29/2007  . Chronic respiratory failure with hypoxia and hypercapnia ([place]) 01/29/2007  . Hyperlipidemia    PCP:  [name], MD Pharmacy:   [facility] [place], [address] [place] [address] Phone: [phone] Fax: [phone]     Social Determinants of Health (SDOH) Interventions    Readmission Risk Interventions No flowsheet data found.

## 2020-04-10 NOTE — Discharge Summary (Addendum)
Physician Discharge Summary  [name] MGQ:[id number] DOB: 02-May-1929 DOA: 04/08/2020  PCP: [name], MD  Admit date: 04/08/2020 Discharge date: 04/11/2020 30 Day Unplanned Readmission Risk Score   Flowsheet Row ED to Hosp-Admission (Current) from 04/08/2020 in [facility]  30 Day Unplanned Readmission Risk Score (%) 42.6 Filed at 04/10/2020 0800     This score is the patient's risk of an unplanned readmission within 30 days of being discharged (0 -100%). The score is based on dignosis, age, lab data, medications, orders, and past utilization.   Low:  0-14.9   Medium: 15-21.9   High: 22-29.9   Extreme: 30 and above         Admitted From: Home Disposition: Home  Recommendations for Outpatient Follow-up:  1. Follow up with PCP in 1-2 weeks 2. Follow-up with cardiology in 1 to 2 weeks 3. Please obtain BMP/CBC in one week 4. Please follow up with your PCP on the following pending results: Unresulted Labs (From admission, onward)         None        Home Health: Yes Equipment/Devices: Hospital bed  Discharge Condition: Stable CODE STATUS: Partial code Diet recommendation: Low-sodium  Subjective: Seen and examined.  His wife was at the bedside.  Patient was sitting at the edge of the bed.  He stated that he was feeling a little bit groggy but much better than yesterday.  Despite of him saying that, he was fully alert and oriented.  He did not have any other complaint, denied any shortness of breath or chest pain.  His wife also concurred that he is very close to his baseline and even on a good day, he complains of being groggy.  Brief/Interim Summary: 87 year old male with multiple complex comorbidities with PVD status post right AKA COPD with chronic hypoxic respiratory failure on home oxygen A. fib CAD history of CABG, HTN, HLD history of bladder surgery and tumor resection on last Tuesday and discharged that night and returned to the hospital after a  fall at home. He woke up during the night and forgot that he had an amputation and fell. Patient was seen in the ED initially was tachycardic hypotensive for EMS and was in a flutter. He has chronic shortness of breath. Patient was noticed to have lower hemoglobin at 8.7 gm.Patient was placed on Cardizem drip cardiology was consulted and admitted under hospital service.  He soon converted to normal sinus rhythm and was transitioned to oral amiodarone.  He was also resumed back on his home dose of oral Lasix.  Cardiology recommended holding beta-blocker at discharge given soft blood pressure and the fact that he will be discharged on amiodarone.  Due to low hemoglobin, they did not recommend any anticoagulation.  Patient is back to his home level of oxygen requirement.  He was seen by OT who had recommended 24-hour supervision versus SNF.  Patient and his wife has declined SNF after but have requested that hospital bed should be ordered.  This has been ordered by TOC.  Wife would like to take him home and resume home health care.  All of that has been arranged for him.  She would also like to drive him home by herself.  Currently hemoglobin 7.6.  Not much lower than his hemoglobin upon admission.  No signs of bleeding.  Hemodynamically stable.  He is being discharged home in a stable condition.  Discussed discharge plan with patient and his wife at the bedside.  Discharge Diagnoses:  Principal Problem:   Fall at home, initial encounter Active Problems:   Hyperlipidemia   Essential hypertension   COPD   GOLD IV/ 02/steroid dep    Anxiety disorder   DNR (do not resuscitate) discussion   Urinary tract obstruction   Atrial fibrillation with RVR (HCC)   Chronic pain    Discharge Instructions   Allergies as of 04/10/2020      Reactions   Cefdinir Diarrhea, Other (See Comments)   Severe Diarrhea   Nitrofurantoin Swelling, Other (See Comments)   Hand Swelling   Sulfa Antibiotics Anaphylaxis,  Swelling   Sulfonamide Derivatives Swelling, Other (See Comments)   Facial/tongue swelling   Tape Other (See Comments)   SKIN IS VERY THIN AND TEARS EASILY!!!!! Please do NOT use "plastic" tape- USE PAPER!!   Ciprofloxacin Itching, Rash, Other (See Comments)   Red itchy hands   Nitrofurantoin Swelling, Other (See Comments)   Swollen hands   Amoxicillin Er Other (See Comments)   Frequest urination. Pt reports "not really" allergic 01/24/20   Doxycycline Other (See Comments)   "Felt terrible" Pt wife reports "not really" allergic 01/24/20   Keflex [cephalexin] Other (See Comments)   Pt does not recall reaction    Mirtazapine Other (See Comments)   Other Other (See Comments)   Ranolazine Other (See Comments)   Inability to ambulate    Chlorhexidine Gluconate Rash   Levaquin [levofloxacin] Itching, Rash   Sertraline Anxiety, Other (See Comments)   Makes the patient jittery      Medication List    STOP taking these medications   metoprolol succinate 25 MG 24 hr tablet Commonly known as: TOPROL-XL     TAKE these medications   acetaminophen 500 MG tablet Commonly known as: TYLENOL Take 500 mg by mouth every 6 (six) hours as needed for moderate pain.   alfuzosin 10 MG 24 hr tablet Commonly known as: UROXATRAL Take 1 tablet (10 mg total) by mouth daily with breakfast.   ALPRAZolam 0.5 MG tablet Commonly known as: XANAX Take 0.5 mg by mouth 3 (three) times daily as needed for anxiety.   amiodarone 200 MG tablet Commonly known as: PACERONE Take 1 tablet (200 mg total) by mouth 2 (two) times daily for 12 days.   amiodarone 200 MG tablet Commonly known as: PACERONE Take 1 tablet (200 mg total) by mouth daily. Start taking on: April 23, 2020   aspirin 81 MG tablet Take 81 mg by mouth daily.   atorvastatin 20 MG tablet Commonly known as: LIPITOR Take 20 mg by mouth at bedtime.   calcium carbonate 500 MG chewable tablet Commonly known as: TUMS - dosed in mg elemental  calcium Chew 1 tablet (200 mg of elemental calcium total) by mouth 2 (two) times daily as needed for indigestion or heartburn.   clotrimazole-betamethasone cream Commonly known as: LOTRISONE Apply 1 application topically daily. Apply to affected area.   famotidine 20 MG tablet Commonly known as: PEPCID Take 20 mg by mouth at bedtime as needed for heartburn or indigestion.   finasteride 5 MG tablet Commonly known as: PROSCAR Take 1 tablet (5 mg total) by mouth daily with lunch.   fluticasone 50 MCG/ACT nasal spray Commonly known as: FLONASE Place 2 sprays into both nostrils daily.   furosemide 40 MG tablet Commonly known as: LASIX Take 1 tablet (40 mg total) by mouth daily. May take up to 80 mg as needed.   guaiFENesin 600 MG 12 hr tablet Commonly known as:  MUCINEX Take 1 tablet (600 mg total) by mouth 2 (two) times daily. What changed:   when to take this  reasons to take this   HYDROcodone-acetaminophen 5-325 MG tablet Commonly known as: NORCO/VICODIN Take 1 tablet by mouth every 4 (four) hours as needed for moderate pain.   levalbuterol 0.63 MG/3ML nebulizer solution Commonly known as: XOPENEX Take 3 mLs (0.63 mg total) by nebulization every 4 (four) hours as needed for wheezing or shortness of breath.   levalbuterol 45 MCG/ACT inhaler Commonly known as: Xopenex HFA Inhale 2 puffs into the lungs every 4 (four) hours as needed for wheezing or shortness of breath.   Nitrostat 0.4 MG SL tablet Generic drug: nitroGLYCERIN Place 0.4 mg under the tongue every 5 (five) minutes as needed for chest pain.   OXYGEN Inhale 2 L/min into the lungs continuous.   pantoprazole 40 MG tablet Commonly known as: PROTONIX TAKE 1 TABLET(40 MG) BY MOUTH DAILY 30 TO 60 MINUTES BEFORE FIRST MEAL OF THE DAY What changed:   how much to take  how to take this  when to take this  additional instructions   polyethylene glycol 17 g packet Commonly known as: MIRALAX / GLYCOLAX Take  17 g by mouth daily. What changed:   when to take this  reasons to take this   predniSONE 10 MG tablet Commonly known as: DELTASONE Take 1 tablet (10 mg total) by mouth daily.   PreserVision AREDS 2 Caps Take 1 capsule by mouth daily.   sertraline 25 MG tablet Commonly known as: ZOLOFT Take 25 mg by mouth daily.   sodium chloride 5 % ophthalmic solution Commonly known as: MURO 128 Place 1 drop into both eyes 4 (four) times daily.   Spiriva Respimat 2.5 MCG/ACT Aers Generic drug: Tiotropium Bromide Monohydrate INHALE 2 PUFFS BY MOUTH EVERY DAY What changed:   how much to take  how to take this  when to take this  additional instructions   Symbicort 160-4.5 MCG/ACT inhaler Generic drug: budesonide-formoterol Inhale 2 puffs into the lungs 2 (two) times daily.   Systane 0.4-0.3 % Soln Generic drug: Polyethyl Glycol-Propyl Glycol Apply 1 drop to eye 2 (two) times daily as needed (eye irritation). What changed: when to take this   vitamin B-12 1000 MCG tablet Commonly known as: CYANOCOBALAMIN Take 1 tablet (1,000 mcg total) by mouth daily.   Vitamin D3 50 MCG (2000 UT) Tabs Take 2,000 Units by mouth daily with lunch.       Follow-up Information    Belva Crome, MD Follow up on 04/15/2020.   Specialty: Cardiology Why: Please arrive 15 minutes early for your 10am post-hospital cardiology appointment Contact information: [address] [place] [address] [phone]        [name], MD Follow up in 1 week(s).   Specialty: Family Medicine Contact information: [place] [address] [phone]              Allergies  Allergen Reactions  . Cefdinir Diarrhea and Other (See Comments)    Severe Diarrhea  . Nitrofurantoin Swelling and Other (See Comments)    Hand Swelling  . Sulfa Antibiotics Anaphylaxis and Swelling  . Sulfonamide Derivatives Swelling and Other (See Comments)    Facial/tongue  swelling  . Tape Other (See Comments)    SKIN IS VERY THIN AND TEARS EASILY!!!!! Please do NOT use "plastic" tape- USE PAPER!!  . Ciprofloxacin Itching, Rash and Other (See Comments)  Red itchy hands  . Nitrofurantoin Swelling and Other (See Comments)    Swollen hands  . Amoxicillin Er Other (See Comments)    Frequest urination. Pt reports "not really" allergic 01/24/20  . Doxycycline Other (See Comments)    "Felt terrible" Pt wife reports "not really" allergic 01/24/20  . Keflex [Cephalexin] Other (See Comments)    Pt does not recall reaction   . Mirtazapine Other (See Comments)  . Other Other (See Comments)  . Ranolazine Other (See Comments)    Inability to ambulate   . Chlorhexidine Gluconate Rash  . Levaquin [Levofloxacin] Itching and Rash  . Sertraline Anxiety and Other (See Comments)    Makes the patient jittery    Consultations: Cardiology   Procedures/Studies: DG Tibia/Fibula Left  Result Date: 04/08/2020 CLINICAL DATA:  Fall.  Laceration.  Pain. EXAM: LEFT TIBIA AND FIBULA - 2 VIEW COMPARISON:  No recent. FINDINGS: Soft tissue bandage noted over the left lower extremity. Small amount of soft tissue air may be present consistent with the patient's history. No radiopaque foreign body. Soft tissue swelling. No acute bony or joint abnormality identified. No evidence of fracture or dislocation. Peripheral vascular calcification. IMPRESSION: 1. Soft tissue bandage noted. Small amount of soft tissue air may be present consistent with the patient's history. No radiopaque foreign body noted. Soft tissue swelling. No acute bony or joint abnormality. 2.  Peripheral vascular disease. Electronically Signed   By: Marcello Moores  Register   On: 04/08/2020 07:41     Discharge Exam: Vitals:   04/10/20 0736 04/10/20 1224  BP: (!) 125/56 102/68  Pulse: 88 95  Resp: 17 16  Temp: 98.1 F (36.7 C) 97.7 F (36.5 C)  SpO2: 100% 100%   Vitals:   04/10/20 0011 04/10/20 0500 04/10/20 0736 04/10/20  1224  BP: 134/80 (!) 103/55 (!) 125/56 102/68  Pulse: 84 80 88 95  Resp: 19 20 17 16   Temp: 98.2 F (36.8 C) 98.2 F (36.8 C) 98.1 F (36.7 C) 97.7 F (36.5 C)  TempSrc: Oral Oral Oral Oral  SpO2: 100% 100% 100% 100%  Weight:      Height:        General: Pt is alert, awake, not in acute distress Cardiovascular: RRR, S1/S2 +, no rubs, no gallops Respiratory: CTA bilaterally with diminished breath sounds, no wheezing, no rhonchi Abdominal: Soft, NT, ND, bowel sounds + Extremities: Trace pitting edema bilateral lower extremity, no cyanosis    The results of significant diagnostics from this hospitalization (including imaging, microbiology, ancillary and laboratory) are listed below for reference.     Microbiology: Recent Results (from the past 240 hour(s))  SARS CORONAVIRUS 2 (TAT 6-24 HRS) Nasopharyngeal Nasopharyngeal Swab     Status: None   Collection Time: 04/02/20  1:42 PM   Specimen: Nasopharyngeal Swab  Result Value Ref Range Status   SARS Coronavirus 2 NEGATIVE NEGATIVE Final    Comment: (NOTE) SARS-CoV-2 target nucleic acids are NOT DETECTED.  The SARS-CoV-2 RNA is generally detectable in upper and lower respiratory specimens during the acute phase of infection. Negative results do not preclude SARS-CoV-2 infection, do not rule out co-infections with other pathogens, and should not be used as the sole basis for treatment or other patient management decisions. Negative results must be combined with clinical observations, patient history, and epidemiological information. The expected result is Negative.  Fact Sheet for Patients: SugarRoll.be  Fact Sheet for Healthcare Providers: https://www.woods-mathews.com/  This test is not yet approved or cleared by the [place]  FDA and  has been authorized for detection and/or diagnosis of SARS-CoV-2 by FDA under an Emergency Use Authorization (EUA). This EUA will remain  in  effect (meaning this test can be used) for the duration of the COVID-19 declaration under Se ction 564(b)(1) of the Act, 21 U.S.C. section 360bbb-3(b)(1), unless the authorization is terminated or revoked sooner.  Performed at [facility], [place] [address] [place], [place] [address]   Resp Panel by RT-PCR (Flu A&B, Covid) Nasopharyngeal Swab     Status: None   Collection Time: 04/08/20 10:28 AM   Specimen: Nasopharyngeal Swab; Nasopharyngeal(NP) swabs in vial transport medium  Result Value Ref Range Status   SARS Coronavirus 2 by RT PCR NEGATIVE NEGATIVE Final    Comment: (NOTE) SARS-CoV-2 target nucleic acids are NOT DETECTED.  The SARS-CoV-2 RNA is generally detectable in upper respiratory specimens during the acute phase of infection. The lowest concentration of SARS-CoV-2 viral copies this assay can detect is 138 copies/mL. A negative result does not preclude SARS-Cov-2 infection and should not be used as the sole basis for treatment or other patient management decisions. A negative result may occur with  improper specimen collection/handling, submission of specimen other than nasopharyngeal swab, presence of viral mutation(s) within the areas targeted by this assay, and inadequate number of viral copies(<138 copies/mL). A negative result must be combined with clinical observations, patient history, and epidemiological information. The expected result is Negative.  Fact Sheet for Patients:  EntrepreneurPulse.com.au  Fact Sheet for Healthcare Providers:  IncredibleEmployment.be  This test is no t yet approved or cleared by the [place] FDA and  has been authorized for detection and/or diagnosis of SARS-CoV-2 by FDA under an Emergency Use Authorization (EUA). This EUA will remain  in effect (meaning this test can be used) for the duration of the COVID-19 declaration under Section 564(b)(1) of the Act, 21 U.S.C.section  360bbb-3(b)(1), unless the authorization is terminated  or revoked sooner.       Influenza A by PCR NEGATIVE NEGATIVE Final   Influenza B by PCR NEGATIVE NEGATIVE Final    Comment: (NOTE) The Xpert Xpress SARS-CoV-2/FLU/RSV plus assay is intended as an aid in the diagnosis of influenza from Nasopharyngeal swab specimens and should not be used as a sole basis for treatment. Nasal washings and aspirates are unacceptable for Xpert Xpress SARS-CoV-2/FLU/RSV testing.  Fact Sheet for Patients: EntrepreneurPulse.com.au  Fact Sheet for Healthcare Providers: IncredibleEmployment.be  This test is not yet approved or cleared by the [place] FDA and has been authorized for detection and/or diagnosis of SARS-CoV-2 by FDA under an Emergency Use Authorization (EUA). This EUA will remain in effect (meaning this test can be used) for the duration of the COVID-19 declaration under Section 564(b)(1) of the Act, 21 U.S.C. section 360bbb-3(b)(1), unless the authorization is terminated or revoked.  Performed at [facility], [place] [address], [place], [place] [address]      Labs: BNP (last 3 results) Recent Labs    02/10/20 0646  BNP 875.6*   Basic Metabolic Panel: Recent Labs  Lab 04/06/20 1553 04/08/20 0640 04/09/20 0612 04/10/20 0237  NA 132* 134* 132* 129*  K 3.4* 3.2* 4.6 4.1  CL 91* 93* 94* 91*  CO2 32 34* 30 32  GLUCOSE 142* 103* 94 95  BUN 22 25* 22 20  CREATININE 1.07 1.38* 1.23 1.10  CALCIUM 8.9 8.9 8.9 8.8*   Liver Function Tests: No results for input(s): AST, ALT, ALKPHOS, BILITOT, PROT, ALBUMIN in  the last 168 hours. No results for input(s): LIPASE, AMYLASE in the last 168 hours. No results for input(s): AMMONIA in the last 168 hours. CBC: Recent Labs  Lab 04/06/20 1553 04/08/20 0640 04/08/20 1653 04/09/20 0612 04/10/20 0237  WBC 10.8* 7.3 8.4 7.5 6.8  HGB 9.6* 8.7* 8.0* 7.9* 7.6*  HCT 30.3* 27.3* 25.4* 24.8*  22.0*  MCV 97.4 96.5 98.1 97.3 90.5  PLT 228 196 170 160 122*   Cardiac Enzymes: No results for input(s): CKTOTAL, CKMB, CKMBINDEX, TROPONINI in the last 168 hours. BNP: Invalid input(s): POCBNP CBG: No results for input(s): GLUCAP in the last 168 hours. D-Dimer No results for input(s): DDIMER in the last 72 hours. Hgb A1c No results for input(s): HGBA1C in the last 72 hours. Lipid Profile No results for input(s): CHOL, HDL, LDLCALC, TRIG, CHOLHDL, LDLDIRECT in the last 72 hours. Thyroid function studies No results for input(s): TSH, T4TOTAL, T3FREE, THYROIDAB in the last 72 hours.  Invalid input(s): FREET3 Anemia work up Recent Labs    04/10/20 0237  VITAMINB12 1,104*  FOLATE 8.9  FERRITIN 56  TIBC 290  IRON 28*  RETICCTPCT 1.0   Urinalysis    Component Value Date/Time   COLORURINE YELLOW 01/20/2020 0125   APPEARANCEUR HAZY (A) 01/20/2020 0125   LABSPEC 1.010 01/20/2020 0125   PHURINE 6.0 01/20/2020 0125   GLUCOSEU NEGATIVE 01/20/2020 0125   HGBUR NEGATIVE 01/20/2020 0125   BILIRUBINUR NEGATIVE 01/20/2020 0125   KETONESUR NEGATIVE 01/20/2020 0125   PROTEINUR NEGATIVE 01/20/2020 0125   NITRITE POSITIVE (A) 01/20/2020 0125   LEUKOCYTESUR LARGE (A) 01/20/2020 0125   Sepsis Labs Invalid input(s): PROCALCITONIN,  WBC,  LACTICIDVEN Microbiology Recent Results (from the past 240 hour(s))  SARS CORONAVIRUS 2 (TAT 6-24 HRS) Nasopharyngeal Nasopharyngeal Swab     Status: None   Collection Time: 04/02/20  1:42 PM   Specimen: Nasopharyngeal Swab  Result Value Ref Range Status   SARS Coronavirus 2 NEGATIVE NEGATIVE Final    Comment: (NOTE) SARS-CoV-2 target nucleic acids are NOT DETECTED.  The SARS-CoV-2 RNA is generally detectable in upper and lower respiratory specimens during the acute phase of infection. Negative results do not preclude SARS-CoV-2 infection, do not rule out co-infections with other pathogens, and should not be used as the sole basis for treatment  or other patient management decisions. Negative results must be combined with clinical observations, patient history, and epidemiological information. The expected result is Negative.  Fact Sheet for Patients: SugarRoll.be  Fact Sheet for Healthcare Providers: https://www.woods-mathews.com/  This test is not yet approved or cleared by the [place] FDA and  has been authorized for detection and/or diagnosis of SARS-CoV-2 by FDA under an Emergency Use Authorization (EUA). This EUA will remain  in effect (meaning this test can be used) for the duration of the COVID-19 declaration under Se ction 564(b)(1) of the Act, 21 U.S.C. section 360bbb-3(b)(1), unless the authorization is terminated or revoked sooner.  Performed at [facility], [place] [address] [place], [place] [address]   Resp Panel by RT-PCR (Flu A&B, Covid) Nasopharyngeal Swab     Status: None   Collection Time: 04/08/20 10:28 AM   Specimen: Nasopharyngeal Swab; Nasopharyngeal(NP) swabs in vial transport medium  Result Value Ref Range Status   SARS Coronavirus 2 by RT PCR NEGATIVE NEGATIVE Final    Comment: (NOTE) SARS-CoV-2 target nucleic acids are NOT DETECTED.  The SARS-CoV-2 RNA is generally detectable in upper respiratory specimens during the acute phase of infection. The lowest concentration  of SARS-CoV-2 viral copies this assay can detect is 138 copies/mL. A negative result does not preclude SARS-Cov-2 infection and should not be used as the sole basis for treatment or other patient management decisions. A negative result may occur with  improper specimen collection/handling, submission of specimen other than nasopharyngeal swab, presence of viral mutation(s) within the areas targeted by this assay, and inadequate number of viral copies(<138 copies/mL). A negative result must be combined with clinical observations, patient history, and epidemiological information.  The expected result is Negative.  Fact Sheet for Patients:  EntrepreneurPulse.com.au  Fact Sheet for Healthcare Providers:  IncredibleEmployment.be  This test is no t yet approved or cleared by the [place] FDA and  has been authorized for detection and/or diagnosis of SARS-CoV-2 by FDA under an Emergency Use Authorization (EUA). This EUA will remain  in effect (meaning this test can be used) for the duration of the COVID-19 declaration under Section 564(b)(1) of the Act, 21 U.S.C.section 360bbb-3(b)(1), unless the authorization is terminated  or revoked sooner.       Influenza A by PCR NEGATIVE NEGATIVE Final   Influenza B by PCR NEGATIVE NEGATIVE Final    Comment: (NOTE) The Xpert Xpress SARS-CoV-2/FLU/RSV plus assay is intended as an aid in the diagnosis of influenza from Nasopharyngeal swab specimens and should not be used as a sole basis for treatment. Nasal washings and aspirates are unacceptable for Xpert Xpress SARS-CoV-2/FLU/RSV testing.  Fact Sheet for Patients: EntrepreneurPulse.com.au  Fact Sheet for Healthcare Providers: IncredibleEmployment.be  This test is not yet approved or cleared by the [place] FDA and has been authorized for detection and/or diagnosis of SARS-CoV-2 by FDA under an Emergency Use Authorization (EUA). This EUA will remain in effect (meaning this test can be used) for the duration of the COVID-19 declaration under Section 564(b)(1) of the Act, 21 U.S.C. section 360bbb-3(b)(1), unless the authorization is terminated or revoked.  Performed at [facility], [place] [address], [place], [place] [address]      Time coordinating discharge: Over 30 minutes  SIGNED:   Darliss Cheney, MD  [facility] 04/11/2020, 7:28 AM  If 7PM-7AM, please contact night-coverage [email or web address]

## 2020-04-11 ENCOUNTER — Encounter: Payer: Self-pay | Admitting: Podiatry

## 2020-04-13 DIAGNOSIS — Z4781 Encounter for orthopedic aftercare following surgical amputation: Secondary | ICD-10-CM | POA: Diagnosis not present

## 2020-04-13 DIAGNOSIS — L89152 Pressure ulcer of sacral region, stage 2: Secondary | ICD-10-CM | POA: Diagnosis not present

## 2020-04-13 DIAGNOSIS — I739 Peripheral vascular disease, unspecified: Secondary | ICD-10-CM | POA: Diagnosis not present

## 2020-04-13 DIAGNOSIS — I5032 Chronic diastolic (congestive) heart failure: Secondary | ICD-10-CM | POA: Diagnosis not present

## 2020-04-13 DIAGNOSIS — I110 Hypertensive heart disease with heart failure: Secondary | ICD-10-CM | POA: Diagnosis not present

## 2020-04-13 DIAGNOSIS — J9611 Chronic respiratory failure with hypoxia: Secondary | ICD-10-CM | POA: Diagnosis not present

## 2020-04-14 ENCOUNTER — Encounter: Payer: Self-pay | Admitting: Physician Assistant

## 2020-04-14 DIAGNOSIS — R3914 Feeling of incomplete bladder emptying: Secondary | ICD-10-CM | POA: Diagnosis not present

## 2020-04-14 NOTE — Progress Notes (Deleted)
Cardiology Office Note    Date:  04/14/2020   ID:  [name], DOB 09/03/1929, MRN [id number]  PCP:  [name], MD  Cardiologist:  Sinclair Grooms, MD  Electrophysiologist:  None   Chief Complaint: ***  History of Present Illness:   [name] is a 87 y.o. male with history of CAD s/p CABG in 2003, PVD s/p right AKA 08/2019 with revision 01/2020, carotid artery disease (1-39% B/L 11/2018), chronic diastolic CHF, HTN, HLD, paroxysmal atrial fibrillation/flutter, RBBB, COPD with chronic respiratory failure on on home O2, anemia, bladder tumor s/p TURBT 04/06/20 who presents for post-hospital follow-up.   Cardiac history is outlined above. His last ischemic evaluation was a NST in 2019 which showed EF 48%, large infarct involving the inferior wall including the inferior apex, deemed an intermediate study, though no further work-up completed at that time. His paroxysmal atrial fibrillation/flutter history dates back to 01/2020 following his AKA revision. He was deemed a poor candidate for anticoagulation at that time due to anemia. His last echocardiogram 01/2020 showed EF 60-65%, no RWMA, mild concentric LVH, indeterminate LV diastolic function, and no significant valvular defects. He was last evaluated by cardiology at an outpatient visit with [name], NP 03/01/20 at which time he there was ongoing concern for failure to thrive. He had no chest pain complaints at that time and was maintaining sinus rhythm. Family discussion between patient, wife, and son resulted in decision to pursue home hospice and orders were placed. He was recently admitted 3/31-4/3 with fall, worsening anemia, hypotension and atrial flutter with RVR. He eventually was transitioned to amiodarone and converted to NSR (200mg  BID x 12 days then 200mg  once daily). Labs also showed thrombocytopenia and hyponatremia. He had a LLE laceration repaired. He was advised to f/u PCP as outpatient. MD is a radiologist in  [place].  F/u pcp sodium, anemia, thyroid  Paroxysmal atrial fib/flutter Chronic diastolic CHF CAD s/p CABG Essential HTN Chronic anemia    Labwork independently reviewed: 04/2020 Hgb 7.6, Na 129, K 4.1, thrombocytopenia 01/2019 albumin 3.1 09/2019 TSH elevated 4.92  Past Medical History:  Diagnosis Date  . Anxiety   . Arthritis    "generalized" (04/04/2017)  . Asthma   . Basal cell carcinoma    "left ear"  . BPH (benign prostatic hyperplasia)    severe; s/p multiple biopsies  . CAD (coronary artery disease)   . Carotid artery occlusion   . Chronic pain 04/08/2020  . Chronic respiratory failure ([facility])   . Chronic rhinitis   . COPD (chronic obstructive pulmonary disease) ([facility])    2L [place] O2  . Depression   . Diastolic heart failure ([facility]) 2019  . Dilation of biliary tract   . Elevated troponin 03/09/2017  . Fall 04/08/2020  . Gallstones   . GERD (gastroesophageal reflux disease)   . History of blood transfusion    "w/his CABG" (04/04/2017)  . History of kidney stones   . Hyperlipidemia   . Hypertension   . On home oxygen therapy    "~ 24/7" (04/04/2017)  . Peripheral vascular disease ([facility])   . Pneumonia 2019  . Squamous cell carcinoma of skin 04/23/2013   in situ-Right flank (txpbx)  . Squamous cell carcinoma of skin 01/23/2017   [place]/ in siut- Left ear rim (CX3FU)  . Syncope and collapse   . Ureteral tumor 08/2015   had endoscopic procedure for evaluation, unable to reach for biopsy  . Urinary catheter (Foley) change  required     Past Surgical History:  Procedure Laterality Date  . ABDOMINAL AORTOGRAM W/LOWER EXTREMITY Bilateral 06/30/2019   Procedure: ABDOMINAL AORTOGRAM W/LOWER EXTREMITY;  Surgeon: Waynetta Sandy, MD;  Location: [facility];  Service: Cardiovascular;  Laterality: Bilateral;  . AMPUTATION Right 08/11/2019   Procedure: RIGHT ABOVE KNEE AMPUTATION;  Surgeon: Angelia Mould, MD;  Location: [facility];  Service: Vascular;   Laterality: Right;  . AMPUTATION Right 01/27/2020   Procedure: RIGHT AMPUTATION ABOVE KNEE REVISION;  Surgeon: Waynetta Sandy, MD;  Location: [facility];  Service: Vascular;  Laterality: Right;  . BASAL CELL CARCINOMA EXCISION Left    ear  . CARDIAC CATHETERIZATION     "prior to bypass"  . CATARACT EXTRACTION W/ INTRAOCULAR LENS  IMPLANT, BILATERAL Bilateral   . CORONARY ARTERY BYPASS GRAFT  10/2001   LIMA to LAD, SVG to OM1-2, SVG to RCA and PDA  . LITHOTRIPSY    . PROSTATE BIOPSY  10/2012   had bleeding after surgery.  . TONSILLECTOMY    . TRANSURETHRAL RESECTION OF BLADDER TUMOR N/A 04/06/2020   Procedure: TRANSURETHRAL RESECTION OF BLADDER TUMOR (TURBT)/ POST OPERATIVE INSTILLATION OF GEMCITABINE;  Surgeon: Festus Aloe, MD;  Location: [facility];  Service: Urology;  Laterality: N/A;    Current Medications: No outpatient medications have been marked as taking for the 04/16/20 encounter (Appointment) with Charlie Pitter, PA-C.   ***   Allergies:   Cefdinir, Nitrofurantoin, Sulfa antibiotics, Sulfonamide derivatives, Tape, Ciprofloxacin, Nitrofurantoin, Amoxicillin er, Doxycycline, Keflex [cephalexin], Mirtazapine, Other, Ranolazine, Chlorhexidine gluconate, Levaquin [levofloxacin], and Sertraline   Social History   Socioeconomic History  . Marital status: Married    Spouse name: Brix Brearley  . Number of children: 2  . Years of education: 85  . Highest education level: 12th grade  Occupational History  . 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 Smoking status: Former Smoker    Packs/day: 2.00    Years: 33.00    Pack years: 66.00    Types: Cigarettes    Quit date: 03/25/1982    Years since quitting: 38.0  . Smokeless tobacco: Never Used  Vaping Use  . Vaping Use: Never used  Substance and Sexual Activity  . Alcohol use: Yes    Alcohol/week: 0.0 standard drinks    Comment: 1.5-3 oz daily in the past, minimal use in  the last few months  . Drug use: No  . Sexual activity: Not Currently  Other Topics Concern  . Not on file  Social History Narrative   ** Merged History Encounter **       Pt lives at home with his spouse.         Social Determinants of Health   Financial Resource Strain: Low Risk   . Difficulty of Paying Living Expenses: Not hard at all  Food Insecurity: No Food Insecurity  . Worried About Charity fundraiser in the Last Year: Never true  . Ran Out of Food in the Last Year: Never true  Transportation Needs: No Transportation Needs  . Lack of Transportation (Medical): No  . Lack of Transportation (Non-Medical): No  Physical Activity: Inactive  . Days of Exercise per Week: 0 days  . Minutes of Exercise per Session: 0 min  Stress: No Stress Concern Present  . Feeling of Stress : Only a little  Social Connections: Moderately Integrated  . Frequency of Communication with Friends and Family: More than three times  a week  . Frequency of Social Gatherings with Friends and Family: More than three times a week  . Attends Religious Services: More than 4 times per year  . Active Member of Clubs or Organizations: No  . Attends Archivist Meetings: Never  . Marital Status: Married     Family History:  The patient's ***family history includes Allergies in his brother; Breast cancer in his mother; Cancer in his mother; Heart attack in his father; Heart disease in his brother, brother, father, and mother; Hyperlipidemia in his brother; Hypertension in his brother and mother; Other in his mother; Peripheral vascular disease in his brother; Stroke in his brother.  ROS:   Please see the history of present illness. Otherwise, review of systems is positive for ***.  All other systems are reviewed and otherwise negative.    EKGs/Labs/Other Studies Reviewed:    Studies reviewed are outlined and summarized above. Reports included below if pertinent.  2D echo 01/2020  1. Left  ventricular ejection fraction, by estimation, is 60 to 65%. The  left ventricle has normal function. The left ventricle has no regional  wall motion abnormalities. There is mild concentric left ventricular  hypertrophy. Left ventricular diastolic  parameters are indeterminate.  2. Right ventricular systolic function is normal. The right ventricular  size is normal.  3. The mitral valve is normal in structure. No evidence of mitral valve  regurgitation. No evidence of mitral stenosis.  4. The aortic valve is tricuspid. There is mild calcification of the  aortic valve. There is mild thickening of the aortic valve. Aortic valve  regurgitation is not visualized. Mild aortic valve sclerosis is present,  with no evidence of aortic valve  stenosis.  5. The inferior vena cava is normal in size with greater than 50%  respiratory variability, suggesting right atrial pressure of 3 mmHg.     EKG:  EKG is ordered today, personally reviewed, demonstrating ***  Recent Labs: 09/22/2019: TSH 4.920 01/31/2020: Magnesium 2.0 02/06/2020: ALT 28 02/10/2020: B Natriuretic Peptide 334.1 04/10/2020: BUN 20; Creatinine, Ser 1.10; Hemoglobin 7.6; Platelets 122; Potassium 4.1; Sodium 129  Recent Lipid Panel    Component Value Date/Time   CHOL 146 03/26/2017 0621   TRIG 34 03/26/2017 0621   HDL 79 03/26/2017 0621   CHOLHDL 1.8 03/26/2017 0621   VLDL 7 03/26/2017 0621   LDLCALC 60 03/26/2017 0621    PHYSICAL EXAM:    VS:  There were no vitals taken for this visit.  BMI: There is no height or weight on file to calculate BMI.  GEN: Well nourished, well developed male in no acute distress HEENT: normocephalic, atraumatic Neck: no JVD, carotid bruits, or masses Cardiac: ***RRR; no murmurs, rubs, or gallops, no edema  Respiratory:  clear to auscultation bilaterally, normal work of breathing GI: soft, nontender, nondistended, + BS MS: no deformity or atrophy Skin: warm and dry, no rash Neuro:  Alert and  Oriented x 3, Strength and sensation are intact, follows commands Psych: euthymic mood, full affect  Wt Readings from Last 3 Encounters:  04/08/20 121 lb 14.6 oz (55.3 kg)  04/06/20 118 lb 8 oz (53.8 kg)  04/02/20 136 lb (61.7 kg)     ASSESSMENT & PLAN:   1. ***  Disposition: F/u with ***   Medication Adjustments/Labs and Tests Ordered: Current medicines are reviewed at length with the patient today.  Concerns regarding medicines are outlined above. Medication changes, Labs and Tests ordered today are summarized above and listed in  the Patient Instructions accessible in Encounters.   Signed, Charlie Pitter, PA-C  04/14/2020 4:44 PM    [facility] [address], [place], [place]  [address] Phone: [phone]; Fax: [phone]

## 2020-04-15 ENCOUNTER — Telehealth: Payer: Self-pay | Admitting: Interventional Cardiology

## 2020-04-15 ENCOUNTER — Ambulatory Visit: Payer: Medicare Other | Admitting: Interventional Cardiology

## 2020-04-15 ENCOUNTER — Other Ambulatory Visit: Payer: Self-pay

## 2020-04-15 ENCOUNTER — Other Ambulatory Visit: Payer: Medicare Other | Admitting: *Deleted

## 2020-04-15 ENCOUNTER — Telehealth: Payer: Self-pay | Admitting: *Deleted

## 2020-04-15 DIAGNOSIS — T887XXA Unspecified adverse effect of drug or medicament, initial encounter: Secondary | ICD-10-CM | POA: Diagnosis not present

## 2020-04-15 DIAGNOSIS — I4892 Unspecified atrial flutter: Secondary | ICD-10-CM | POA: Diagnosis not present

## 2020-04-15 DIAGNOSIS — E871 Hypo-osmolality and hyponatremia: Secondary | ICD-10-CM | POA: Diagnosis not present

## 2020-04-15 DIAGNOSIS — J449 Chronic obstructive pulmonary disease, unspecified: Secondary | ICD-10-CM | POA: Diagnosis not present

## 2020-04-15 DIAGNOSIS — R609 Edema, unspecified: Secondary | ICD-10-CM | POA: Diagnosis not present

## 2020-04-15 DIAGNOSIS — R5383 Other fatigue: Secondary | ICD-10-CM | POA: Diagnosis not present

## 2020-04-15 DIAGNOSIS — D649 Anemia, unspecified: Secondary | ICD-10-CM | POA: Diagnosis not present

## 2020-04-15 DIAGNOSIS — Z09 Encounter for follow-up examination after completed treatment for conditions other than malignant neoplasm: Secondary | ICD-10-CM | POA: Diagnosis not present

## 2020-04-15 DIAGNOSIS — Z515 Encounter for palliative care: Secondary | ICD-10-CM

## 2020-04-15 NOTE — Telephone Encounter (Signed)
Agree 

## 2020-04-15 NOTE — Telephone Encounter (Signed)
Spoke with wife. She states patient is asleep right now but his breathing has become worse. He feels terrible ,no energy .  She states she and he thinks is the Amiodarone is causing the change.  Patient was started Amiodarone from recent Hospitalization for afib w/RVR - he had fallen.  Ms Franek states patient went to see urologist yesterday and it was very difficult for transfer from his home to the doctor's office.  She states they would like to start Hospice and palliative care for patient .   They have an appointment to see primary Dr Harrington Challenger today at 3 pm to discuss hospice. She states she does not know if they will be able to make that appointment or the appointment tomorrow with D. Dunn PA (cardiology) due to difficult with extreme fatigue and SOB.  Ms Kersh states she has not obtain any vital signs this morning .  She would like to know if Dr Tamala Julian would discontinue Amiodarone. RN informed wife. That Amiodarone has long - half life and will not be out patient system for a while, but will defer to Dr Tamala Julian and D. Dunn PA since patient has the upcoming appointment for tomorrow for further instructions.

## 2020-04-15 NOTE — Telephone Encounter (Signed)
Spoke with Dr. Tamala Julian and he said ok to stop Amiodarone.  Made wife aware.  Wife wanted to keep appt with Melina Copa, PA-C tomorrow.  Advised if they decide he doesn't need the appt anymore, call as soon as possible to get it cancelled.  Wife agreeable to plan.

## 2020-04-15 NOTE — Telephone Encounter (Signed)
Spoke with Dr. Tamala Julian and he encouraged that they reach out to PCP to see if Hospice can be initiated without being seen or with a virtual visit.  Spoke with wife and made her aware.  She states she has to go to PCP today because he has sutures that have to be removed.  She inquired about pt stopping Amiodarone.  Advised I will run this by Dr. Tamala Julian and call her back.

## 2020-04-15 NOTE — Telephone Encounter (Signed)
Med Issue:   1. Name of Medication: amiodarone (PACERONE) 200 MG tablet  2. How are you currently taking this medication (dosage and times per day)? by mouth 2 (two) times daily for 12 days  3. Are you having a reaction (difficulty breathing--STAT)? Yes   4. What is your medication issue?  Patient's wife states this medication is causing extreme fatigue and SOB. She states his skin is also tingling.    Pt c/o Shortness Of Breath: STAT if SOB developed within the last 24 hours or pt is noticeably SOB on the phone  1. Are you currently SOB (can you hear that pt is SOB on the phone)?  Patient's wife states the patient is currently SOB, but it's due to his COPD   2. How long have you been experiencing SOB?  Patient's wife states the patient is always somewhat SOB due to COPD, but it has become worse over the past 24 hours.  3. Are you SOB when sitting or when up moving around?  Both   4. Are you currently experiencing any other symptoms?  fatigue

## 2020-04-16 ENCOUNTER — Other Ambulatory Visit: Payer: Self-pay

## 2020-04-16 ENCOUNTER — Other Ambulatory Visit: Payer: Medicare Other | Admitting: *Deleted

## 2020-04-16 ENCOUNTER — Ambulatory Visit: Payer: Medicare Other | Admitting: Physician Assistant

## 2020-04-16 VITALS — BP 123/72 | HR 81 | Temp 97.9°F | Resp 20

## 2020-04-16 DIAGNOSIS — I2510 Atherosclerotic heart disease of native coronary artery without angina pectoris: Secondary | ICD-10-CM

## 2020-04-16 DIAGNOSIS — Z515 Encounter for palliative care: Secondary | ICD-10-CM

## 2020-04-16 DIAGNOSIS — I4892 Unspecified atrial flutter: Secondary | ICD-10-CM

## 2020-04-16 DIAGNOSIS — I5032 Chronic diastolic (congestive) heart failure: Secondary | ICD-10-CM

## 2020-04-16 DIAGNOSIS — I480 Paroxysmal atrial fibrillation: Secondary | ICD-10-CM

## 2020-04-16 DIAGNOSIS — I10 Essential (primary) hypertension: Secondary | ICD-10-CM

## 2020-04-16 DIAGNOSIS — D649 Anemia, unspecified: Secondary | ICD-10-CM

## 2020-04-16 NOTE — Progress Notes (Signed)
COMMUNITY PALLIATIVE CARE RN NOTE  PATIENT NAME: [name] DOB: 12/05/1929 MRN: [id number]  PRIMARY CARE PROVIDER: [name], MD  RESPONSIBLE PARTY: [name] (wife) Acct ID - Guarantor Home Phone Work Phone Relationship Acct Type  [id number] MARKY, BURESH [phone]  Self P/F     [address] [place], [place] [address]   Covid-19 Pre-screening Negative  PLAN OF CARE and INTERVENTION:  1. ADVANCE CARE PLANNING/GOALS OF CARE: Goal is for patient to remain in his home and avoid hospitalizations. He is a Full code.  2. PATIENT/CAREGIVER EDUCATION: Symptom management, hospice education, safe transfers, s/s of infection 3. DISEASE STATUS: Received a call from patient's wife, [name] stating that patient is doing poorly and feels like he may possibly be in afib again. She is requesting a RN visit. Upon my arrival, patient is sitting up on the edge of the bed awake and alert. He says that he feels weaker than usual and wanted to make sure he was not back in a-fib w/RVR, which occurred recently while he was in the hospital. He was discharged home from the hospital on oral Amiodarone 200 mg twice daily. He says he feels the medication caused him to feel worse and requested from his Cardiologist yesterday that he discontinue this medication and MD said this was ok. Currently he denies any pain. He did not appear to be in any respiratory distress. His vitals are stable. His heart beat is 81 and slightly irregular but rate is controlled. He had an in-person visit scheduled today with his Cardiologist but was unable to make it there. He had an issue with his foley catheter and bladder spasms causing the urine to push around the bulb so became soiled. The RN from Dr. Thompson Caul office called while I was there to advise the wife that they did not feel there was anything more they could suggest for patient other than hospice. I was able to speak with the RN and provide her an update of my current  assessment. Dr. Harrington Challenger faxed over the order today for a hospice consult for this patient and his hospice admission visit is scheduled for tomorrow at 6:30p. We were both aware of this. He was able to see his PCP yesterday for him to assess the sutures in his left leg where he had a fall on 04/08/20. Wife states that MD decided not to take the sutures out this visit, as the area was still swollen and suggested waiting another 2 weeks or so. Patient feels better knowing that his cardiac assessment is stable today and said he was going to lie down and rest. Will continue to monitor.   HISTORY OF PRESENT ILLNESS: This is a 87 yo male with a h/o CHF, PVD, CAD COPD, NSTEMI and R BKA. Palliative care team continues to follow patient and visits monthly and PRN    CODE STATUS: Full code ADVANCED DIRECTIVES: N MOST FORM: no PPS: 30%   PHYSICAL EXAM:   VITALS: Today's Vitals   04/16/20 1447  BP: 123/72  Pulse: 81  Resp: 20  Temp: 97.9 F (36.6 C)  TempSrc: Temporal  SpO2: 100%  PainSc: 0-No pain    LUNGS: clear to auscultation  CARDIAC: Cor irreg, heart rate controlled EXTREMITIES: 1+ and pitting edema to left leg (On Lasix BID) SKIN: Sacral wound almost healed covered with dressing; right leg laceration sutures covered with dressing that is dry and intact  NEURO: Alert and oriented x 3, HOH, increased generalized weakness, wheelchair bound,  R AKA   (Duration of visit and documentation 45 minutes)   [name], RN BSN

## 2020-04-16 NOTE — Telephone Encounter (Signed)
Called Dr. Alan Ripper office per wife's request to ask for a verbal order for a hospice consult and to see if he would agree to be the attending physician while patient is under hospice care. Spoke with their receptionist who says his office does not give verbal orders and they would need [place] ([facility]) to fax them the request. I spoke with our Community Liaison Enid Skeens who took our hospice referral form directly to Dr. Alan Ripper office in person. The office states they will have his nurse get it filled out and faxed back to [place].

## 2020-04-19 DIAGNOSIS — Z4781 Encounter for orthopedic aftercare following surgical amputation: Secondary | ICD-10-CM | POA: Diagnosis not present

## 2020-04-19 DIAGNOSIS — J9611 Chronic respiratory failure with hypoxia: Secondary | ICD-10-CM | POA: Diagnosis not present

## 2020-04-19 DIAGNOSIS — L89152 Pressure ulcer of sacral region, stage 2: Secondary | ICD-10-CM | POA: Diagnosis not present

## 2020-04-19 DIAGNOSIS — I739 Peripheral vascular disease, unspecified: Secondary | ICD-10-CM | POA: Diagnosis not present

## 2020-04-19 DIAGNOSIS — I5032 Chronic diastolic (congestive) heart failure: Secondary | ICD-10-CM | POA: Diagnosis not present

## 2020-04-19 DIAGNOSIS — I110 Hypertensive heart disease with heart failure: Secondary | ICD-10-CM | POA: Diagnosis not present

## 2020-04-20 ENCOUNTER — Other Ambulatory Visit: Payer: Self-pay

## 2020-04-20 ENCOUNTER — Telehealth: Payer: Self-pay | Admitting: Interventional Cardiology

## 2020-04-20 ENCOUNTER — Encounter (HOSPITAL_BASED_OUTPATIENT_CLINIC_OR_DEPARTMENT_OTHER): Payer: Medicare Other | Attending: Internal Medicine | Admitting: Internal Medicine

## 2020-04-20 DIAGNOSIS — J449 Chronic obstructive pulmonary disease, unspecified: Secondary | ICD-10-CM | POA: Insufficient documentation

## 2020-04-20 DIAGNOSIS — I2510 Atherosclerotic heart disease of native coronary artery without angina pectoris: Secondary | ICD-10-CM | POA: Insufficient documentation

## 2020-04-20 DIAGNOSIS — L97829 Non-pressure chronic ulcer of other part of left lower leg with unspecified severity: Secondary | ICD-10-CM | POA: Diagnosis not present

## 2020-04-20 DIAGNOSIS — H9190 Unspecified hearing loss, unspecified ear: Secondary | ICD-10-CM | POA: Diagnosis not present

## 2020-04-20 DIAGNOSIS — L89153 Pressure ulcer of sacral region, stage 3: Secondary | ICD-10-CM | POA: Diagnosis not present

## 2020-04-20 DIAGNOSIS — I739 Peripheral vascular disease, unspecified: Secondary | ICD-10-CM | POA: Diagnosis not present

## 2020-04-20 DIAGNOSIS — I509 Heart failure, unspecified: Secondary | ICD-10-CM | POA: Diagnosis not present

## 2020-04-20 DIAGNOSIS — H269 Unspecified cataract: Secondary | ICD-10-CM | POA: Insufficient documentation

## 2020-04-20 DIAGNOSIS — Z89611 Acquired absence of right leg above knee: Secondary | ICD-10-CM | POA: Insufficient documentation

## 2020-04-20 DIAGNOSIS — E039 Hypothyroidism, unspecified: Secondary | ICD-10-CM | POA: Insufficient documentation

## 2020-04-20 DIAGNOSIS — Z951 Presence of aortocoronary bypass graft: Secondary | ICD-10-CM | POA: Insufficient documentation

## 2020-04-20 DIAGNOSIS — X58XXXD Exposure to other specified factors, subsequent encounter: Secondary | ICD-10-CM | POA: Diagnosis not present

## 2020-04-20 DIAGNOSIS — I110 Hypertensive heart disease with heart failure: Secondary | ICD-10-CM | POA: Insufficient documentation

## 2020-04-20 DIAGNOSIS — Z85828 Personal history of other malignant neoplasm of skin: Secondary | ICD-10-CM | POA: Diagnosis not present

## 2020-04-20 DIAGNOSIS — Z9981 Dependence on supplemental oxygen: Secondary | ICD-10-CM | POA: Insufficient documentation

## 2020-04-20 DIAGNOSIS — S40022D Contusion of left upper arm, subsequent encounter: Secondary | ICD-10-CM | POA: Diagnosis not present

## 2020-04-20 NOTE — Progress Notes (Signed)
[name], [name] ([id number]) Visit Report for 04/20/2020 HPI Details Patient Name: Date of Service: DO RN, [name] 04/20/2020 2:00 PM Medical Record Number: [id number] Patient Account Number: [id number] Date of Birth/Sex: Treating RN: July 28, 1929 (87 y.o. M) Primary Care Provider: [name] Other Clinician: Referring Provider: Treating Provider/Extender: [name] in Treatment: 23 History of Present Illness HPI Description: 01/30/18 on evaluation today patient presents for initial inspection concerning the skin tear of the right forearm. Fortunately there does not appear to be any evidence of infection and this occurred approximately three days ago. He has been using Vaseline over the region. With that being said the patient does have a history of cataracts COPD, hypertension, peripheral arterial disease, and osteoarthritis. He tells me that he does have a little bit of pain at the site but nothing too significant at this time which is good news. Fortunately this overall appears to be doing fairly well which is good news. No fevers, chills, nausea, or vomiting noted at this time. The biggest issue at this point is that the wound is of quite significant size. READMISSION 04/21/2019 This is a 87 year old man with multiple medical problems who was seen once in the clinic here in January 2020 with a skin tear on his right forearm seen by [name]. This problem this time started sometime recently. He was noted by podiatry to have erosions and wounds of his right foot on toes 1-3. He also saw [name] of dermatology who gave him Silvadene cream for what I think was felt to be a stasis dermatitis of his bilateral lower legs. At some point he was referred to [name]. He was also noted to have previous noninvasive arterial studies that showed no flow to either one of his toes and noncompressible ABIs bilaterally. [name] did noninvasive arterial studies on him. These  showed that he had greater than 75% stenosis in the proximal and mid common femoral artery. Monophasic flow in the posterior tibial and dorsalis pedis positions on the right foot. ABIs again were noncompressible. He was felt to have total occlusion of the superficial femoral artery on the right. The overall interpretation is that he had severe multilevel arterial occlusive disease. Absent femoral pulses and superficial femoral artery occlusions. In spite of this he was not felt to be candidate for arteriography given his bilateral femoral artery occlusions as well he would not be a candidate for endovascular approach secondary to his common femoral artery occlusions and proximal iliac disease. He was not felt to be a candidate for an open infra inguinal bypass. He therefore felt he would need to be monitored over time with the only option being a right below-knee amputation The patient complains currently of pain at night when he is up in bed. The pain in the right foot is better when he puts the leg down over the bed side. This is compatible with rest pain. He has very limited activity i.e. is even exhausted getting dressed in the morning because of cardiopulmonary issues therefore it is difficult to gauge claudication I believe he was referred here by Dr. [name] with one of the major issues is whether he would be a candidate for hyperbaric oxygen. He is not a diabetic. Dr. [name] did give him Silvadene cream for dry flaking skin in his bilateral lower extremities, however the patient is concerned about using this in the face of sulfa allergy. Podiatry had previously given him in a steroid cream I do  not believe these use this either Past medical history is extensive including basal cell and squamous cell skin cancers, combined congestive heart failure, O2 dependent COPD, cor pulmonale, venous stasis dermatitis, peripheral arterial disease, hearing loss, 4/20; the patient still has a wound on the  right first toe. This is on the medial aspect extended there is a second area on the plantar aspect. Most of what the patient talks about is claudication with minimal activity [however the patient is limited by his COPD], or even at rest at night. He is not getting a lot of sleep. We use silver collagen on this wound 5/4; the patient has a wound on the tip of his right great toe also on the medial aspect. Setting of severe nonrevascularizable PAD [see description in HPI]. He does not describe any change in his pain. We have been using silver collagen 5/18; 2-week follow-up. The patient has wound on the tip of his right great toe as well as the medial aspect. He has developed new wounds x2 on the right lateral foot and the right lateral calcaneus. These look like ischemic areas. The patient talks about claudication at night. He seems to be more comfortable during the day. We have been using silver collagen to the wounds. I have previously debrided the eschar on the toes however this comes right back. 6/1; 2-week follow-up. This patient has ischemic wounds on his right foot in the setting of nonunreconstructable PAD [previously reviewed by Dr. Dixon]. He does not really describe severe pain but he does have episodic pain in the right great toe. He has an area on the tip of the right great toe as well as the medial aspect of the right great toe. A new area on the tip of the right fourth toe. He has the area on the right lateral foot fortunately the area on the right lateral heel appears to have epithelialized over. We are using silver collagen to all wounds 06/23/2019 upon evaluation today patient appears to be doing about the same in regard to his foot ulcers. Unfortunately he has 2 new skin tears on his left elbow and the right forearm that were new as of today. He states that it was doing okay until they went to change the dressing and they put some collagen on it that got stuck and somewhat pulled back  the skin unfortunately. Fortunately there is no signs of active infection at this time. No fevers, chills, nausea, vomiting, or diarrhea. 6/29; the patient was hospitalized from 6/18 through 6/23 going in with increasing drainage from the wounds on his right foot. He underwent a angiogram on 06/30/2019 by Dr. Donzetta Matters which revealed multilevel arterial disease including occlusion of the right common femoral artery. He reconstitutes the profunda femoris and the below-knee popliteal artery and appears to have posterior tibial peroneal runoff to the ankle although there is heavy calcification of the popliteal below the knee. He was offered consideration of a right above-knee amputation, palliative and/or hospice care and consideration of a very high risk attempt at bypass. He did receive IV antibiotics. He comes in the clinic today again with the 2 areas that are necrotic on the left great toe and a small punched out area on the right lateral foot. A lot of the erythema on top of the right foot but no warmth. He has a lot of swelling here. The patient is literally tortured over this decision that he needs to make. I went over this with him in exceptional detail.  I do not believe he is a candidate for any form of open bypass surgery. I think his vascular doctors would agree with that. He still has pain at night when he puts his leg up on the bed that is relieved by putting his leg dependent. This is not different from what I remember. He is not septic there is no additional skin breakdown that I can see 7/8 the patient continues to have severe pain in the right foot at night making it difficult for him to rest. There is increasing erythema in the right foot but the foot is cold. I do not think this is an infection I think this is likely to be tissue necrosis from ischemia. I thought he would have made an up decision today about going forward with hospice he has not. He did not go to see [name] on 7/6. Again  he wants to talk to me at length about amputations. I do not think he will do well after an above-knee amputation on the right. Furthermore I do not think he is a candidate for bypass surgery on the right leg 7/20; patient still having a lot of pain in the right foot. He saw Dr. Donzetta Matters on 7/16. He is not felt to be able to undergo a complex revascularization and I certainly agree with that. He was offered a right below-knee amputation but he has not made that decision. I have repetitively suggested hospice care if he will not undergo an amputation he is not decided he wants to go that route either. He arrives in the clinic today accompanied by his son who is a Stage manager and his wife. I think the patient has tissue breakdown in the dorsal foot secondary to ischemia although his son brought up that some of the erythema could be a contact dermatitis as they were apparently layering a large area of pure call over the wound area. I was not aware of this not supposed to be doing it in this fashion. In any case the erythema look better than last week and I am quite convinced this is not cellulitis READMISSION 11/11/2019 Mr. Paula Libra is now an 87 year old man who we had in this clinic from [place] through July/21 largely as a result of ischemic wounds in his right foot. He was followed by vein and vascular [name]. His anatomy was not felt to be amenable to a revascularization. The patient had increasing ischemic pain but was very resistant to the notion of an amputation. Eventually he did go through with this on August 22 he will underwent an AKA by [name]. He went to rehabilitation. There he developed a small stage III lower sacral pressure ulcer and an area on his left lateral heel which is also probably a pressure ulcer. They have been using silver collagen on both wound areas. The patient states he is not in any unrelieved pain. He is in a wheelchair at home with a Roho cushion. He is able to do his  own transfers. I think he is probably putting far too much pressure on these areas Past medical history is reviewed. He has a history of CHF, severe PAD, hypertension, coronary artery disease status post CABG, COPD Gold stage IV on O2, hypothyroidism and a recent problem with urinary retention he now has a Foley catheter. He has had problems with left leg swelling and adjustment of his diuretics he is followed by Dr. Daneen Schick. The patient has severe PAD. On 06/30/2019 he had an angiogram  on the remaining left side he had heavily diseased femoral artery. His superficial femoral artery was flush occluded with runoff in the profunda. He was felt his SFA was likely occluded and reconstitutes the popliteal artery via runoff via the peroneal and posterior tibial artery. As noted he is not in a lot of pain. His last noninvasive study was on 6/20. On the left side this was noncompressible ABI but a TBI of only 0.20 with dampened monophasic waveforms 11/25/2019. Patient has 2 wound areas 1 on the left lateral heel and a ulcer on his coccyx. These are probably all pressure related although he has significant PAD complicating any attempt to heal a wound on the left heel. We have been using silver collagen on the wound and the coccyx silver alginate on the heel. 11/30; miraculously the left lateral heel wound has healed. The area on his coccyx is not much better. We have been using Prisma He apparently went back to see [name]. His wife paraphrasing says that he had terrible blood flow in the left lower extremity but he is not a candidate for revascularization. Fortunately had enough blood flow to heal the wound I will try to look at Dr. Mee Hives note by the time these next year 12/14; the area on his left lateral heel remains closed The area on his coccyx is about the same in terms of surface area and depth minimal debris however but it is still adherent. We have been using Iodoflex. They stop me before I  left the room to show me his right AKA stump. He tells me he is having a lot of pain in this area that is episodic it almost looks like there is bruising in this area but the bruising looks old I asked him about trauma but they were not aware of any. They see vascular tomorrow 12/28; the area on the coccyx looks absolutely no better. Completely lifeless surface of fibrinous debris and surrounding raised skin. We have been using Iodoflex this is simply not working. He had a fall when he got up forgetting that he was missing his right leg he has skin tears on the right humerus area just above the elbow and on the right forearm. 1/11; the area on the coccyx is about the same we have been using Santyl although the surface of this is no better. Small punched out wound. He also has skin tears on his arm which are drying up gradually. His wife had called urgently yesterday to be seen. Apparently he developed erythema and swelling of his left foot over the weekend and she was able to express a teaspoon of pus coming from the dorsal foot. She had called her podiatrist who prescribed Keflex however in response to advice from our office she took him to the emergency room. They apparently gave him IV antibiotics and discharged him on doxycycline. His white count was 14.5 with 92% neutrophils. He is not systemically unwell and does not really complain of a lot of pain 3/1; patient has not been here in about 7 weeks. In the interim he was hospitalized on 2 occasions from 01/23/2020 through 02/05/2020. The original admission was for cellulitis of the left leg he received IV antibiotics and Augmentin. It was noted that the right AKA stump broke down ultimately requiring a revision of the AKA by vein and vascular and he went to rehab medicine in hospital from 1/27 through 02/16/2020 He is back at home now. The only wound he has is on  the lower coccyx everything else is closed. He is not complaining of any pain in the left  leg although there is edema up into his mid thigh. He apparently has had his Lasix increased and this was felt to be secondary to congestive heart failure 3/22. The patient is followed here for a small wound on his lower sacrum/coccyx. He has been using silver collagen the wound is small with some depth. They asked me to look at a fairly large annular lesion on the left anterior lower leg this is not scaled or painful. It is not pruritic. The patient's wife tells me this just came up over the weekend past 4/12; patient still has a very tiny open area on the lower sacrum/coccyx. I changed him to Iodoflex last time or Iodosorb ointment unknown though there were making too much of a difference here. Since he was last here he fell suffered a laceration on his left medial lower leg that required suturing. He ended up getting admitted from 3/31 through 4/3 apparently for apparently atrial fibrillation. He was put on amiodarone. He returned home he has not been able to tolerate the amiodarone and stopped that. Apparently he was on topical all that was discontinued as well because his blood pressure was too low. He has 3-4+ pitting edema in the left lower leg Electronic Signature(s) Signed: 04/20/2020 4:33:39 PM By: [name] MD Entered By: [name] on 04/20/2020 15:48:34 -------------------------------------------------------------------------------- Physical Exam Details Patient Name: Date of Service: DO RN, [name] 04/20/2020 2:00 PM Medical Record Number: [id number] Patient Account Number: [id number] Date of Birth/Sex: Treating RN: 01/05/1930 (87 y.o. M) Primary Care Provider: [name] Other Clinician: Referring Provider: Treating Provider/Extender: [name] in Treatment: 23 Constitutional Blood pressure at 102/59. His pulse feels strong and regular. Temperature is normal and within the target range for the patient.. Somewhat frail elderly  man. Cardiovascular JVP is elevated. 3-4+ pitting edema of the left leg. There is no evidence of a DVT he has very significant PAD. Notes Wound exam; small coccyx pressure area looks about the same. Very small wound. Really too small to place any of the standard dressings in. He has a significant laceration on the left medial lower leg Y-shaped suturing. Some of this is open. Looking at the margins of the wound carefully I do not think that this is going to hold together if I go ahead and take the sutures out now. I am going to try to give him some compression to help support the area Electronic Signature(s) Signed: 04/20/2020 4:33:39 PM By: [name] MD Entered By: [name] on 04/20/2020 15:52:11 -------------------------------------------------------------------------------- Physician Orders Details Patient Name: Date of Service: DO RN, [name] 04/20/2020 2:00 PM Medical Record Number: [id number] Patient Account Number: [id number] Date of Birth/Sex: Treating RN: 07-20-1929 (87 y.o. [name] Primary Care Provider: [name] Other Clinician: Referring Provider: Treating Provider/Extender: [name] in Treatment: 23 Verbal / Phone Orders: No Diagnosis Coding ICD-10 Coding Code Description L89.153 Pressure ulcer of sacral region, stage 3 Z89.611 Acquired absence of right leg above knee S40.022D Contusion of left upper arm, subsequent encounter Follow-up Appointments ppointment in 1 week. - Tuesday Return A Bathing/ Shower/ Hygiene May shower and wash wound with soap and water. - with dressing changes only. Edema Control - Lymphedema / SCD / Other Elevate legs to the level of the heart or above for 30 minutes daily and/or when sitting, a frequency of: -  throughout the day. Off-Loading Turn and reposition every 2 hours [place] wound care orders this week; continue Home Health for wound care. May utilize formulary  equivalent dressing for wound treatment orders unless otherwise specified. - [facility]. Wound Treatment Wound #15 - Sacrum Cleanser: Wound Cleanser [facility]) Every Other Day/30 Days Discharge Instructions: Cleanse the wound with wound cleanser prior to applying a clean dressing using gauze sponges, not tissue or cotton balls. Peri-Wound Care: Skin Prep [facility]) Every Other Day/30 Days Discharge Instructions: Use skin prep as directed Prim Dressing: Iodosorb Gel 10 (gm) Tube Every Other Day/30 Days ary Discharge Instructions: Apply to wound bed as instructed Secondary Dressing: ComfortFoam Border, 3x3 in (silicone border) [facility]) Every Other Day/30 Days Discharge Instructions: Apply over primary dressing as directed. Wound #18 - Lower Leg Wound Laterality: Left, Medial Cleanser: Soap and Water (Home Health) 2 x Per Week/30 Days Discharge Instructions: May shower and wash wound with dial antibacterial soap and water prior to dressing change. Cleanser: Wound Cleanser (Home Health) 2 x Per Week/30 Days Discharge Instructions: Cleanse the wound with wound cleanser prior to applying a clean dressing using gauze sponges, not tissue or cotton balls. Peri-Wound Care: Sween Lotion (Moisturizing lotion) (Home Health) 2 x Per Week/30 Days Discharge Instructions: Apply moisturizing lotion as directed Prim Dressing: KerraCel Ag Gelling Fiber Dressing, 4x5 in (silver alginate) (Home Health) 2 x Per Week/30 Days ary Discharge Instructions: Apply silver alginate to wound bed as instructed Secondary Dressing: ABD Pad, 8x10 (Home Health) 2 x Per Week/30 Days Discharge Instructions: Apply over primary dressing as directed. Secondary Dressing: ALLEVYN Heel 4 1/2in x 5 1/2in / 10.5cm x 13.5cm (Home Health) 2 x Per Week/30 Days Discharge Instructions: Apply heel cup for protection. Apply over primary dressing as directed. Compression Wrap: Kerlix Roll 4.5x3.1 (in/yd) (Home Health) 2 x  Per Week/30 Days Discharge Instructions: Apply Kerlix and Coban compression as directed. ****APPLY LIGHTLY**** Compression Wrap: Coban Self-Adherent Wrap 4x5 (in/yd) (Home Health) 2 x Per Week/30 Days Discharge Instructions: Apply over Kerlix as directed. ****APPLY LIGHTLY**** Electronic Signature(s) Signed: 04/20/2020 4:33:39 PM By: [name] MD Signed: 04/20/2020 6:06:00 PM By: [name] Entered By: [name] on 04/20/2020 15:41:21 -------------------------------------------------------------------------------- Problem List Details Patient Name: Date of Service: DO RN, [name] 04/20/2020 2:00 PM Medical Record Number: [id number] Patient Account Number: [id number] Date of Birth/Sex: Treating RN: 09-Oct-1929 (87 y.o. [name] Primary Care Provider: [name] Other Clinician: Referring Provider: Treating Provider/Extender: [name] in Treatment: 23 Active Problems ICD-10 Encounter Code Description Active Date MDM Diagnosis L89.153 Pressure ulcer of sacral region, stage 3 11/11/2019 No Yes Z89.611 Acquired absence of right leg above knee 11/11/2019 No Yes S40.022D Contusion of left upper arm, subsequent encounter 01/06/2020 No Yes Inactive Problems ICD-10 Code Description Active Date Inactive Date L89.623 Pressure ulcer of left heel, stage 3 11/11/2019 11/11/2019 L03.116 Cellulitis of left lower limb 01/20/2020 01/20/2020 Resolved Problems Electronic Signature(s) Signed: 04/20/2020 4:33:39 PM By: [name] MD Entered By: [name] on 04/20/2020 15:46:24 -------------------------------------------------------------------------------- Progress Note Details Patient Name: Date of Service: DO RN, [name] 04/20/2020 2:00 PM Medical Record Number: [id number] Patient Account Number: [id number] Date of Birth/Sex: Treating RN: [birth year] (87 y.o. M) Primary Care Provider: [name] Other Clinician: Referring  Provider: Treating Provider/Extender: [name] in Treatment: 23 Subjective History of Present Illness (HPI) 01/30/18 on evaluation today patient presents for initial inspection concerning the skin tear of  the right forearm. Fortunately there does not appear to be any evidence of infection and this occurred approximately three days ago. He has been using Vaseline over the region. With that being said the patient does have a history of cataracts COPD, hypertension, peripheral arterial disease, and osteoarthritis. He tells me that he does have a little bit of pain at the site but nothing too significant at this time which is good news. Fortunately this overall appears to be doing fairly well which is good news. No fevers, chills, nausea, or vomiting noted at this time. The biggest issue at this point is that the wound is of quite significant size. READMISSION 04/21/2019 This is a 87 year old man with multiple medical problems who was seen once in the clinic here in January 2020 with a skin tear on his right forearm seen by [name]. This problem this time started sometime recently. He was noted by podiatry to have erosions and wounds of his right foot on toes 1-3. He also saw [name] of dermatology who gave him Silvadene cream for what I think was felt to be a stasis dermatitis of his bilateral lower legs. At some point he was referred to [name]. He was also noted to have previous noninvasive arterial studies that showed no flow to either one of his toes and noncompressible ABIs bilaterally. [name] did noninvasive arterial studies on him. These showed that he had greater than 75% stenosis in the proximal and mid common femoral artery. Monophasic flow in the posterior tibial and dorsalis pedis positions on the right foot. ABIs again were noncompressible. He was felt to have total occlusion of the superficial femoral artery on the right. The overall interpretation  is that he had severe multilevel arterial occlusive disease. Absent femoral pulses and superficial femoral artery occlusions. In spite of this he was not felt to be candidate for arteriography given his bilateral femoral artery occlusions as well he would not be a candidate for endovascular approach secondary to his common femoral artery occlusions and proximal iliac disease. He was not felt to be a candidate for an open infra inguinal bypass. He therefore felt he would need to be monitored over time with the only option being a right below-knee amputation The patient complains currently of pain at night when he is up in bed. The pain in the right foot is better when he puts the leg down over the bed side. This is compatible with rest pain. He has very limited activity i.e. is even exhausted getting dressed in the morning because of cardiopulmonary issues therefore it is difficult to gauge claudication I believe he was referred here by Dr. [name] with one of the major issues is whether he would be a candidate for hyperbaric oxygen. He is not a diabetic. Dr. [name] did give him Silvadene cream for dry flaking skin in his bilateral lower extremities, however the patient is concerned about using this in the face of sulfa allergy. 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 This is on the medial aspect extended there is a second area on the plantar aspect. Most of what the patient talks about is claudication with minimal activity [however the patient is  limited by his COPD], or even at rest at night. He is not getting a lot of sleep. We use silver collagen on this wound 5/4;  the patient has a wound on the tip of his right great toe also on the medial aspect. Setting of severe nonrevascularizable PAD [see description in HPI]. He does not describe any change in his pain. We have been using silver collagen 5/18; 2-week follow-up. The patient has wound on the tip of his right great toe as well as the medial aspect. He has developed new wounds x2 on the right lateral foot and the right lateral calcaneus. These look like ischemic areas. The patient talks about claudication at night. He seems to be more comfortable during the day. We have been using silver collagen to the wounds. I have previously debrided the eschar on the toes however this comes right back. 6/1; 2-week follow-up. This patient has ischemic wounds on his right foot in the setting of nonunreconstructable PAD [previously reviewed by Dr. Dixon]. He does not really describe severe pain but he does have episodic pain in the right great toe. He has an area on the tip of the right great toe as well as the medial aspect of the right great toe. A new area on the tip of the right fourth toe. He has the area on the right lateral foot fortunately the area on the right lateral heel appears to have epithelialized over. We are using silver collagen to all wounds 06/23/2019 upon evaluation today patient appears to be doing about the same in regard to his foot ulcers. Unfortunately he has 2 new skin tears on his left elbow and the right forearm that were new as of today. He states that it was doing okay until they went to change the dressing and they put some collagen on it that got stuck and somewhat pulled back the skin unfortunately. Fortunately there is no signs of active infection at this time. No fevers, chills, nausea, vomiting, or diarrhea. 6/29; the patient was hospitalized from 6/18 through 6/23 going in with increasing drainage from the wounds on his right foot. He underwent a angiogram on 06/30/2019 by Dr. Donzetta Matters which  revealed multilevel arterial disease including occlusion of the right common femoral artery. He reconstitutes the profunda femoris and the below-knee popliteal artery and appears to have posterior tibial peroneal runoff to the ankle although there is heavy calcification of the popliteal below the knee. He was offered consideration of a right above-knee amputation, palliative and/or hospice care and consideration of a very high risk attempt at bypass. He did receive IV antibiotics. He comes in the clinic today again with the 2 areas that are necrotic on the left great toe and a small punched out area on the right lateral foot. A lot of the erythema on top of the right foot but no warmth. He has a lot of swelling here. The patient is literally tortured over this decision that he needs to make. I went over this with him in exceptional detail. I do not believe he is a candidate for any form of open bypass surgery. I think his vascular doctors would agree with that. He still has pain at night when he puts his leg up on the bed that is relieved by putting his leg dependent. This is not different from what I remember. He is not septic there is no additional skin breakdown that I can see 7/8 the patient continues to have severe pain in the right  foot at night making it difficult for him to rest. There is increasing erythema in the right foot but the foot is cold. I do not think this is an infection I think this is likely to be tissue necrosis from ischemia. I thought he would have made an up decision today about going forward with hospice he has not. He did not go to see [name] on 7/6. Again he wants to talk to me at length about amputations. I do not think he will do well after an above-knee amputation on the right. Furthermore I do not think he is a candidate for bypass surgery on the right leg 7/20; patient still having a lot of pain in the right foot. He saw Dr. Donzetta Matters on 7/16. He is not felt to be able to  undergo a complex revascularization and I certainly agree with that. He was offered a right below-knee amputation but he has not made that decision. I have repetitively suggested hospice care if he will not undergo an amputation he is not decided he wants to go that route either. He arrives in the clinic today accompanied by his son who is a Stage manager and his wife. I think the patient has tissue breakdown in the dorsal foot secondary to ischemia although his son brought up that some of the erythema could be a contact dermatitis as they were apparently layering a large area of pure call over the wound area. I was not aware of this not supposed to be doing it in this fashion. In any case the erythema look better than last week and I am quite convinced this is not cellulitis READMISSION 11/11/2019 Mr. Paula Libra is now an 87 year old man who we had in this clinic from [place] through July/21 largely as a result of ischemic wounds in his right foot. He was followed by vein and vascular [name]. His anatomy was not felt to be amenable to a revascularization. The patient had increasing ischemic pain but was very resistant to the notion of an amputation. Eventually he did go through with this on August 22 he will underwent an AKA by [name]. He went to rehabilitation. There he developed a small stage III lower sacral pressure ulcer and an area on his left lateral heel which is also probably a pressure ulcer. They have been using silver collagen on both wound areas. The patient states he is not in any unrelieved pain. He is in a wheelchair at home with a Roho cushion. He is able to do his own transfers. I think he is probably putting far too much pressure on these areas Past medical history is reviewed. He has a history of CHF, severe PAD, hypertension, coronary artery disease status post CABG, COPD Gold stage IV on O2, hypothyroidism and a recent problem with urinary retention he now has a Foley catheter.  He has had problems with left leg swelling and adjustment of his diuretics he is followed by Dr. Daneen Schick. The patient has severe PAD. On 06/30/2019 he had an angiogram on the remaining left side he had heavily diseased femoral artery. His superficial femoral artery was flush occluded with runoff in the profunda. He was felt his SFA was likely occluded and reconstitutes the popliteal artery via runoff via the peroneal and posterior tibial artery. As noted he is not in a lot of pain. His last noninvasive study was on 6/20. On the left side this was noncompressible ABI but a TBI of only 0.20 with dampened monophasic waveforms  11/25/2019. Patient has 2 wound areas 1 on the left lateral heel and a ulcer on his coccyx. These are probably all pressure related although he has significant PAD complicating any attempt to heal a wound on the left heel. We have been using silver collagen on the wound and the coccyx silver alginate on the heel. 11/30; miraculously the left lateral heel wound has healed. The area on his coccyx is not much better. We have been using Prisma He apparently went back to see [name]. His wife paraphrasing says that he had terrible blood flow in the left lower extremity but he is not a candidate for revascularization. Fortunately had enough blood flow to heal the wound I will try to look at Dr. Mee Hives note by the time these next year 12/14; the area on his left lateral heel remains closed ooThe area on his coccyx is about the same in terms of surface area and depth minimal debris however but it is still adherent. We have been using Iodoflex. ooThey stop me before I left the room to show me his right AKA stump. He tells me he is having a lot of pain in this area that is episodic it almost looks like there is bruising in this area but the bruising looks old I asked him about trauma but they were not aware of any. They see vascular tomorrow 12/28; the area on the coccyx looks  absolutely no better. Completely lifeless surface of fibrinous debris and surrounding raised skin. We have been using Iodoflex this is simply not working. ooHe had a fall when he got up forgetting that he was missing his right leg he has skin tears on the right humerus area just above the elbow and on the right forearm. 1/11; the area on the coccyx is about the same we have been using Santyl although the surface of this is no better. Small punched out wound. He also has skin tears on his arm which are drying up gradually. His wife had called urgently yesterday to be seen. Apparently he developed erythema and swelling of his left foot over the weekend and she was able to express a teaspoon of pus coming from the dorsal foot. She had called her podiatrist who prescribed Keflex however in response to advice from our office she took him to the emergency room. They apparently gave him IV antibiotics and discharged him on doxycycline. His white count was 14.5 with 92% neutrophils. He is not systemically unwell and does not really complain of a lot of pain 3/1; patient has not been here in about 7 weeks. In the interim he was hospitalized on 2 occasions from 01/23/2020 through 02/05/2020. The original admission was for cellulitis of the left leg he received IV antibiotics and Augmentin. It was noted that the right AKA stump broke down ultimately requiring a revision of the AKA by vein and vascular and he went to rehab medicine in hospital from 1/27 through 02/16/2020 He is back at home now. The only wound he has is on the lower coccyx everything else is closed. He is not complaining of any pain in the left leg although there is edema up into his mid thigh. He apparently has had his Lasix increased and this was felt to be secondary to congestive heart failure 3/22. The patient is followed here for a small wound on his lower sacrum/coccyx. He has been using silver collagen the wound is small with some  depth. ooThey asked me to look at a  fairly large annular lesion on the left anterior lower leg this is not scaled or painful. It is not pruritic. The patient's wife tells me this just came up over the weekend past 4/12; patient still has a very tiny open area on the lower sacrum/coccyx. I changed him to Iodoflex last time or Iodosorb ointment unknown though there were making too much of a difference here. Since he was last here he fell suffered a laceration on his left medial lower leg that required suturing. He ended up getting admitted from 3/31 through 4/3 apparently for apparently atrial fibrillation. He was put on amiodarone. He returned home he has not been able to tolerate the amiodarone and stopped that. Apparently he was on topical all that was discontinued as well because his blood pressure was too low. He has 3-4+ pitting edema in the left lower leg Objective Constitutional Blood pressure at 102/59. His pulse feels strong and regular. Temperature is normal and within the target range for the patient.. Somewhat frail elderly man. Vitals Time Taken: 3:08 PM, Height: 69 in, Temperature: 98.0 F, Pulse: 93 bpm, Respiratory Rate: 20 breaths/min, Blood Pressure: 102/59 mmHg. Cardiovascular JVP is elevated. 3-4+ pitting edema of the left leg. There is no evidence of a DVT he has very significant PAD. General Notes: Wound exam; small coccyx pressure area looks about the same. Very small wound. Really too small to place any of the standard dressings in. ooHe has a significant laceration on the left medial lower leg Y-shaped suturing. Some of this is open. Looking at the margins of the wound carefully I do not think that this is going to hold together if I go ahead and take the sutures out now. I am going to try to give him some compression to help support the area Integumentary (Hair, Skin) Wound #15 status is Open. Original cause of wound was Pressure Injury. The date acquired was: 08/01/2019.  The wound has been in treatment 23 weeks. The wound is located on the Sacrum. The wound measures 0.2cm length x 0.2cm width x 0.2cm depth; 0.031cm^2 area and 0.006cm^3 volume. Wound #18 status is Open. Original cause of wound was Trauma. The date acquired was: 04/08/2020. The wound is located on the Left,Medial Lower Leg. The wound measures 3.5cm length x 1cm width x 0.1cm depth; 2.749cm^2 area and 0.275cm^3 volume. 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 - Tuesday Bathing/ Shower/ Hygiene: May shower and wash wound with soap and water. - with dressing changes only. Edema Control - Lymphedema / SCD / Other: Elevate legs to the level of the heart or above for 30 minutes daily and/or when sitting, a frequency of: - throughout the day. Off-Loading: Turn and reposition every 2 hours Home Health: New wound care orders this week; continue Home Health for wound care. May utilize formulary equivalent dressing for wound treatment orders unless otherwise specified. - [facility]. WOUND #15: - Sacrum Wound Laterality: Cleanser: Wound Cleanser (Home Health) Every Other Day/30 Days Discharge Instructions: Cleanse the wound with wound cleanser prior to applying a clean dressing using gauze sponges, not tissue or cotton balls. Peri-Wound Care: Skin Prep [facility]) Every Other Day/30 Days Discharge Instructions: Use skin prep as directed Prim Dressing: Iodosorb Gel 10 (gm) Tube Every Other Day/30 Days ary Discharge Instructions: Apply to wound bed as instructed Secondary Dressing: ComfortFoam Border,  3x3 in (silicone border) [facility]) Every Other Day/30 Days Discharge Instructions: Apply over primary dressing as directed. WOUND #18: - Lower Leg Wound Laterality: Left, Medial Cleanser: Soap and Water  (Home Health) 2 x Per Week/30 Days Discharge Instructions: May shower and wash wound with dial antibacterial soap and water prior to dressing change. Cleanser: Wound Cleanser (Home Health) 2 x Per Week/30 Days Discharge Instructions: Cleanse the wound with wound cleanser prior to applying a clean dressing using gauze sponges, not tissue or cotton balls. Peri-Wound Care: Sween Lotion (Moisturizing lotion) (Home Health) 2 x Per Week/30 Days Discharge Instructions: Apply moisturizing lotion as directed Prim Dressing: KerraCel Ag Gelling Fiber Dressing, 4x5 in (silver alginate) (Home Health) 2 x Per Week/30 Days ary Discharge Instructions: Apply silver alginate to wound bed as instructed Secondary Dressing: ABD Pad, 8x10 (Home Health) 2 x Per Week/30 Days Discharge Instructions: Apply over primary dressing as directed. Secondary Dressing: ALLEVYN Heel 4 1/2in x 5 1/2in / 10.5cm x 13.5cm (Home Health) 2 x Per Week/30 Days Discharge Instructions: Apply heel cup for protection. Apply over primary dressing as directed. Com pression Wrap: Kerlix Roll 4.5x3.1 (in/yd) (Home Health) 2 x Per Week/30 Days Discharge Instructions: Apply Kerlix and Coban compression as directed. ****APPLY LIGHTLY**** Com pression Wrap: Coban Self-Adherent Wrap 4x5 (in/yd) (Home Health) 2 x Per Week/30 Days Discharge Instructions: Apply over Kerlix as directed. ****APPLY LIGHTLY**** 1. I put silver alginate over the laceration on the left medial lower leg and kerlix Coban 2. He has severe PAD I cannot put anything tighter than this and I am not even sure that is going to help. 3. I continued with Iodoflex to the coccyx area right is ordered there is really no other option here 4. I talked to them about hospice so he can avoid going to doctors. They have already stopped the amiodarone for the atrial fibrillation. Their big problem is that they would not be able to go to outside physicians such as the wound care center Electronic  Signature(s) Signed: 04/20/2020 4:33:39 PM By: [name] MD Entered By: [name] on 04/20/2020 15:53:33 -------------------------------------------------------------------------------- SuperBill Details Patient Name: Date of Service: DO RN, [name] 04/20/2020 Medical Record Number: [id number] Patient Account Number: [id number] Date of Birth/Sex: Treating RN: [birth year] (87 y.o. [name] Primary Care Provider: [name] Other Clinician: Referring Provider: Treating Provider/Extender: [name] in Treatment: 23 Diagnosis Coding ICD-10 Codes Code Description [phone] Pressure ulcer of sacral region, stage 3 Z89.611 Acquired absence of right leg above knee S40.022D Contusion of left upper arm, subsequent encounter Facility Procedures CPT4 Code: [id number] Description: [phone] - WOUND CARE VISIT-LEV 5 EST PT Modifier: Quantity: 1 Physician Procedures : CPT4 Code Description Modifier [id number] - WC PHYS LEVEL 3 - EST PT ICD-10 Diagnosis Description L89.153 Pressure ulcer of sacral region, stage 3 Quantity: 1 Electronic Signature(s) Signed: 04/20/2020 4:33:39 PM By: [name] MD Entered By: [name] on 04/20/2020 15:53:53

## 2020-04-20 NOTE — Telephone Encounter (Signed)
Reviewed chart - determined Furosemide to written for 40 mg daily and up to 80 mg daily as needed for swelling.  Called to review with Danae Chen - obtain VM - left message of Furosemide instructions (as requested in previous note) and requested she c/b if further concerns.

## 2020-04-20 NOTE — Telephone Encounter (Signed)
Pt c/o medication issue:  1. Name of Medication:  furosemide (LASIX) 40 MG tablet   2. How are you currently taking this medication (dosage and times per day)?  Danae Chen reports the patient has been taking 40 MG daily  3. Are you having a reaction (difficulty breathing--STAT)? No  4. What is your medication issue?  Erica with [facility] is requesting clarification on how the patient is to take his medication. She states the patient has been taking 40 MG daily. She assumes he is unaware that if he has issues with swelling he can take up to 80 MG, per the instructions. Please return call to [place] to discuss. She requested a detailed voice message if she is unavailable when her call is returned.

## 2020-04-20 NOTE — Progress Notes (Signed)
RASHI, GIULIANI ([id number]) Visit Report for 04/20/2020 Fall Risk Assessment Details Patient Name: Date of Service: DO RN, [place] V ID L. 04/20/2020 2:00 PM Medical Record Number: [id number] Patient Account Number: [id number] Date of Birth/Sex: Treating RN: [birth year] (87 y.o. [name] Primary Care Willette Mudry: [name] Other Clinician: Referring Nylah Butkus: Treating Allaina Brotzman/Extender: [name] in Treatment: 23 Fall Risk Assessment Items Have you had 2 or more falls in the last 12 monthso 0 No Have you had any fall that resulted in injury in the last 12 monthso 0 Yes FALLS RISK SCREEN History of falling - immediate or within 3 months 25 Yes Secondary diagnosis (Do you have 2 or more medical diagnoseso) 0 No Ambulatory aid None/bed rest/wheelchair/nurse 0 Yes Crutches/cane/walker 0 No Furniture 0 No Intravenous therapy Access/Saline/Heparin Lock 0 No Gait/Transferring Normal/ bed rest/ wheelchair 0 No Weak (short steps with or without shuffle, stooped but able to lift head while walking, may seek 10 Yes support from furniture) Impaired (short steps with shuffle, may have difficulty arising from chair, head down, impaired 0 No balance) Mental Status Oriented to own ability 0 Yes Electronic Signature(s) Signed: 04/20/2020 6:06:00 PM By: [name] Entered By: [name] on 04/20/2020 15:36:34

## 2020-04-21 NOTE — Progress Notes (Signed)
Stemler, [name] ([id number]) Visit Report for 04/20/2020 Arrival Information Details Patient Name: Date of Service: DO RN, [place] V ID L. 04/20/2020 2:00 PM Medical Record Number: [id number] Patient Account Number: [id number] Date of Birth/Sex: Treating RN: 07/29/1929 (87 y.o. M) Primary Care [name]: [name] Other Clinician: Referring [name]: Treating [name]/Extender: [name] in Treatment: 23 Visit Information History Since Last Visit Added or deleted any medications: No Patient Arrived: Wheel Chair Any new allergies or adverse reactions: No Arrival Time: 15:07 Had a fall or experienced change in Yes Accompanied By: wife activities of daily living that may affect Transfer Assistance: None risk of falls: Patient Identification Verified: Yes Signs or symptoms of abuse/neglect since last visito No Secondary Verification Process Completed: Yes Hospitalized since last visit: Yes Patient Requires Transmission-Based Precautions: No Implantable device outside of the clinic excluding No Patient Has Alerts: Yes cellular tissue based products placed in the center Patient Alerts: L ABI N/C, TBI = .20 since last visit: Has Dressing in Place as Prescribed: Yes Pain Present Now: No Electronic Signature(s) Signed: 04/21/2020 8:29:36 AM By: [name] Entered By: [name] on 04/20/2020 15:10:06 -------------------------------------------------------------------------------- Clinic Level of Care Assessment Details Patient Name: Date of Service: DO RN, [name] 04/20/2020 2:00 PM Medical Record Number: [id number] Patient Account Number: [id number] Date of Birth/Sex: Treating RN: 02/07/1929 (87 y.o. [name] Primary Care [name]: [name] Other Clinician: Referring [name]: Treating [name]/Extender: [name] in Treatment: 23 Clinic Level of Care Assessment Items TOOL 4 Quantity Score X- 1 0 Use when  only an EandM is performed on FOLLOW-UP visit ASSESSMENTS - Nursing Assessment / Reassessment X- 1 10 Reassessment of Co-morbidities (includes updates in patient status) X- 1 5 Reassessment of Adherence to Treatment Plan ASSESSMENTS - Wound and Skin A ssessment / Reassessment []  - 0 Simple Wound Assessment / Reassessment - one wound X- 2 5 Complex Wound Assessment / Reassessment - multiple wounds X- 1 10 Dermatologic / Skin Assessment (not related to wound area) ASSESSMENTS - Focused Assessment []  - 0 Circumferential Edema Measurements - multi extremities X- 1 10 Nutritional Assessment / Counseling / Intervention []  - 0 Lower Extremity Assessment (monofilament, tuning fork, pulses) []  - 0 Peripheral Arterial Disease Assessment (using hand held doppler) ASSESSMENTS - Ostomy and/or Continence Assessment and Care []  - 0 Incontinence Assessment and Management []  - 0 Ostomy Care Assessment and Management (repouching, etc.) PROCESS - Coordination of Care []  - 0 Simple Patient / Family Education for ongoing care X- 1 20 Complex (extensive) Patient / Family Education for ongoing care X- 1 10 Staff obtains Programmer, systems, Records, T Results / Process Orders est X- 1 10 Staff telephones HHA, Nursing Homes / Clarify orders / etc []  - 0 Routine Transfer to another Facility (non-emergent condition) []  - 0 Routine Hospital Admission (non-emergent condition) []  - 0 New Admissions / Biomedical engineer / Ordering NPWT Apligraf, etc. , []  - 0 Emergency Hospital Admission (emergent condition) []  - 0 Simple Discharge Coordination X- 1 15 Complex (extensive) Discharge Coordination PROCESS - Special Needs []  - 0 Pediatric / Minor Patient Management []  - 0 Isolation Patient Management []  - 0 Hearing / Language / Visual special needs []  - 0 Assessment of Community assistance (transportation, D/C planning, etc.) []  - 0 Additional assistance / Altered mentation []  - 0 Support  Surface(s) Assessment (bed, cushion, seat, etc.) INTERVENTIONS - Wound Cleansing / Measurement []  - 0 Simple Wound Cleansing - one wound X- 2 5  Complex Wound Cleansing - multiple wounds X- 1 5 Wound Imaging (photographs - any number of wounds) []  - 0 Wound Tracing (instead of photographs) []  - 0 Simple Wound Measurement - one wound X- 2 5 Complex Wound Measurement - multiple wounds INTERVENTIONS - Wound Dressings X - Small Wound Dressing one or multiple wounds 1 10 []  - 0 Medium Wound Dressing one or multiple wounds X- 1 20 Large Wound Dressing one or multiple wounds []  - 0 Application of Medications - topical []  - 0 Application of Medications - injection INTERVENTIONS - Miscellaneous []  - 0 External ear exam []  - 0 Specimen Collection (cultures, biopsies, blood, body fluids, etc.) []  - 0 Specimen(s) / Culture(s) sent or taken to Lab for analysis []  - 0 Patient Transfer (multiple staff / Civil Service fast streamer / Similar devices) []  - 0 Simple Staple / Suture removal (25 or less) []  - 0 Complex Staple / Suture removal (26 or more) []  - 0 Hypo / Hyperglycemic Management (close monitor of Blood Glucose) []  - 0 Ankle / Brachial Index (ABI) - do not check if billed separately X- 1 5 Vital Signs Has the patient been seen at the hospital within the last three years: Yes Total Score: 160 Level Of Care: New/Established - Level 5 Electronic Signature(s) Signed: 04/20/2020 6:06:00 PM By: [name] Entered By: [name] on 04/20/2020 15:44:02 -------------------------------------------------------------------------------- Multi Wound Chart Details Patient Name: Date of Service: DO RN, [name] 04/20/2020 2:00 PM Medical Record Number: [id number] Patient Account Number: [id number] Date of Birth/Sex: Treating RN: [birth year] (87 y.o. M) Primary Care [name]: [name] Other Clinician: Referring [name]: Treating [name]/Extender: [name] in  Treatment: 23 Vital Signs Height(in): 69 Pulse(bpm): 93 Weight(lbs): Blood Pressure(mmHg): 102/59 Body Mass Index(BMI): Temperature(F): 98.0 Respiratory Rate(breaths/min): 20 Photos: [15:No Photos Sacrum] [18:No Photos Left, Medial Lower Leg] [N/A:N/A N/A] Wound Location: [15:Pressure Injury] [18:Trauma] [N/A:N/A] Wounding Event: [15:Pressure Ulcer] [18:Trauma, Other] [N/A:N/A] Primary Etiology: [15:08/01/2019] [18:04/08/2020] [N/A:N/A] Date Acquired: [15:23] [18:0] [N/A:N/A] Weeks of Treatment: [15:Open] [18:Open] [N/A:N/A] Wound Status: [15:0.2x0.2x0.2] [18:3.5x1x0.1] [N/A:N/A] Measurements L x W x D (cm) [15:0.031] [18:2.749] [N/A:N/A] A (cm) : rea [40:9.811] [18:0.275] [N/A:N/A] Volume (cm) : [15:85.90%] [18:N/A] [N/A:N/A] % Reduction in A rea: [15:72.70%] [18:N/A] [N/A:N/A] % Reduction in Volume: [15:Category/Stage III] [18:N/A] [N/A:N/A] Treatment Notes Electronic Signature(s) Signed: 04/20/2020 4:33:39 PM By: [name] MD Entered By: [name] on 04/20/2020 15:46:33 -------------------------------------------------------------------------------- Multi-Disciplinary Care Plan Details Patient Name: Date of Service: DO RN, [name] 04/20/2020 2:00 PM Medical Record Number: [id number] Patient Account Number: [id number] Date of Birth/Sex: Treating RN: 1929-06-15 (87 y.o. [name] Primary Care Kenyetta Wimbish: [name] Other Clinician: Referring Auguste Tebbetts: Treating Myrical Andujo/Extender: [name] in Treatment: 23 Active Inactive Wound/Skin Impairment Nursing Diagnoses: Knowledge deficit related to ulceration/compromised skin integrity Goals: Patient/caregiver will verbalize understanding of skin care regimen Date Initiated: 11/11/2019 Target Resolution Date: 05/07/2020 Goal Status: Active Ulcer/skin breakdown will have a volume reduction of 30% by week 4 Date Initiated: 11/11/2019 Target Resolution Date: 05/07/2020 Goal Status:  Active Interventions: Assess patient/caregiver ability to obtain necessary supplies Assess patient/caregiver ability to perform ulcer/skin care regimen upon admission and as needed Assess ulceration(s) every visit Notes: Electronic Signature(s) Signed: 04/20/2020 6:06:00 PM By: [name] Entered By: [name] on 04/20/2020 15:06:32 -------------------------------------------------------------------------------- Pain Assessment Details Patient Name: Date of Service: DO RN, [name] 04/20/2020 2:00 PM Medical Record Number: [id number] Patient Account Number: [id number] Date of Birth/Sex: Treating RN: 10/12/1929 (87 y.o.  M) Primary Care Dale Strausser: [name] Other Clinician: Referring Shariyah Eland: Treating Priyah Schmuck/Extender: [name] in Treatment: 23 Active Problems Location of Pain Severity and Description of Pain Patient Has Paino No Site Locations Pain Management and Medication Current Pain Management: Electronic Signature(s) Signed: 04/21/2020 8:29:36 AM By: [name] Entered By: [name] on 04/20/2020 15:09:10 -------------------------------------------------------------------------------- Patient/Caregiver Education Details Patient Name: Date of Service: DO RN, [name] 4/12/2022andnbsp2:00 PM Medical Record Number: [id number] Patient Account Number: [id number] Date of Birth/Gender: Treating RN: 1929/04/05 (87 y.o. [name] Primary Care Physician: [name] Other Clinician: Referring Physician: Treating Physician/Extender: [name] in Treatment: 23 Education Assessment Education Provided To: Patient Education Topics Provided Wound/Skin Impairment: Handouts: Skin Care Do's and Dont's Methods: Explain/Verbal Responses: Reinforcements needed Electronic Signature(s) Signed: 04/20/2020 6:06:00 PM By: [name] Entered By: [name] on 04/20/2020  15:06:45 -------------------------------------------------------------------------------- Wound Assessment Details Patient Name: Date of Service: DO RN, [name] 04/20/2020 2:00 PM Medical Record Number: [id number] Patient Account Number: [id number] Date of Birth/Sex: Treating RN: 1929/01/24 (87 y.o. M) Primary Care Kathrene Sinopoli: [name] Other Clinician: Referring Strummer Canipe: Treating Caswell Alvillar/Extender: [name] in Treatment: 23 Wound Status Wound Number: 15 Primary Pressure Ulcer Etiology: Wound Location: Sacrum Wound Open Wounding Event: Pressure Injury Status: Date Acquired: 08/01/2019 Comorbid Cataracts, Asthma, Chronic Obstructive Pulmonary Disease Weeks Of Treatment: 23 History: (COPD), Congestive Heart Failure, Coronary Artery Disease, Clustered Wound: No Hypertension, Peripheral Arterial Disease, Osteoarthritis, Confinement Anxiety Photos Wound Measurements Length: (cm) 0.2 Width: (cm) 0.2 Depth: (cm) 0.2 Area: (cm) 0.031 Volume: (cm) 0.006 % Reduction in Area: 85.9% % Reduction in Volume: 72.7% Epithelialization: Medium (34-66%) Wound Description Classification: Category/Stage III Wound Margin: Epibole Exudate Amount: Small Exudate Type: Serous Exudate Color: amber Foul Odor After Cleansing: No Slough/Fibrino Yes Wound Bed Granulation Amount: Medium (34-66%) Exposed Structure Granulation Quality: Pink, Pale Fascia Exposed: No Necrotic Amount: Medium (34-66%) Fat Layer (Subcutaneous Tissue) Exposed: Yes Necrotic Quality: Adherent Slough Tendon Exposed: No Muscle Exposed: No Joint Exposed: No Bone Exposed: No Electronic Signature(s) Signed: 04/21/2020 8:29:36 AM By: [name] Entered By: [name] on 04/20/2020 17:01:53 -------------------------------------------------------------------------------- Wound Assessment Details Patient Name: Date of Service: DO RN, [name] 04/20/2020 2:00 PM Medical Record  Number: [id number] Patient Account Number: [id number] Date of Birth/Sex: Treating RN: 1929-11-07 (87 y.o. M) Primary Care Shaterria Sager: [name] Other Clinician: Referring Rei Contee: Treating Tahiry Spicer/Extender: [name] in Treatment: 23 Wound Status Wound Number: 18 Primary Trauma, Other Etiology: Wound Location: Left, Medial Lower Leg Wound Open Wounding Event: Trauma Status: Date Acquired: 04/08/2020 Comorbid Cataracts, Asthma, Chronic Obstructive Pulmonary Disease Weeks Of Treatment: 0 History: (COPD), Congestive Heart Failure, Coronary Artery Disease, Clustered Wound: No Hypertension, Peripheral Arterial Disease, Osteoarthritis, Confinement Anxiety Photos Wound Measurements Length: (cm) 3.5 Width: (cm) 1 Depth: (cm) 0.1 Area: (cm) 2.749 Volume: (cm) 0.275 % Reduction in Area: 0% % Reduction in Volume: 0% Wound Description Classification: Partial Thickness Electronic Signature(s) Signed: 04/21/2020 8:29:36 AM By: [name] Entered By: [name] on 04/20/2020 17:01:30 -------------------------------------------------------------------------------- Vitals Details Patient Name: Date of Service: DO RN, [name] 04/20/2020 2:00 PM Medical Record Number: [id number] Patient Account Number: [id number] Date of Birth/Sex: Treating RN: [birth year] (87 y.o. M) Primary Care Ronetta Molla: [name] Other Clinician: Referring Taeler Winning: Treating Annaly Skop/Extender: [name] in Treatment: 23 Vital Signs Time Taken: 15:08 Temperature (F): 98.0 Height (in): 69 Pulse (bpm): 93 Respiratory Rate (breaths/min): 20 Blood Pressure (  mmHg): 102/59 Reference Range: 80 - 120 mg / dl Electronic Signature(s) Signed: 04/21/2020 8:29:36 AM By: [name] Entered By: [name] on 04/20/2020 15:08:59

## 2020-04-22 DIAGNOSIS — L89152 Pressure ulcer of sacral region, stage 2: Secondary | ICD-10-CM | POA: Diagnosis not present

## 2020-04-22 DIAGNOSIS — I110 Hypertensive heart disease with heart failure: Secondary | ICD-10-CM | POA: Diagnosis not present

## 2020-04-22 DIAGNOSIS — J9611 Chronic respiratory failure with hypoxia: Secondary | ICD-10-CM | POA: Diagnosis not present

## 2020-04-22 DIAGNOSIS — I5032 Chronic diastolic (congestive) heart failure: Secondary | ICD-10-CM | POA: Diagnosis not present

## 2020-04-22 DIAGNOSIS — Z4781 Encounter for orthopedic aftercare following surgical amputation: Secondary | ICD-10-CM | POA: Diagnosis not present

## 2020-04-22 DIAGNOSIS — I739 Peripheral vascular disease, unspecified: Secondary | ICD-10-CM | POA: Diagnosis not present

## 2020-04-22 NOTE — Telephone Encounter (Signed)
[name] is following up. She understands the medication instructions and has no further questions unless Dr. Tamala Julian has another recommendation for when the patient has swelling.

## 2020-04-23 DIAGNOSIS — Z4781 Encounter for orthopedic aftercare following surgical amputation: Secondary | ICD-10-CM | POA: Diagnosis not present

## 2020-04-23 DIAGNOSIS — I110 Hypertensive heart disease with heart failure: Secondary | ICD-10-CM | POA: Diagnosis not present

## 2020-04-23 DIAGNOSIS — L89152 Pressure ulcer of sacral region, stage 2: Secondary | ICD-10-CM | POA: Diagnosis not present

## 2020-04-23 DIAGNOSIS — I5032 Chronic diastolic (congestive) heart failure: Secondary | ICD-10-CM | POA: Diagnosis not present

## 2020-04-23 DIAGNOSIS — J9611 Chronic respiratory failure with hypoxia: Secondary | ICD-10-CM | POA: Diagnosis not present

## 2020-04-23 DIAGNOSIS — I739 Peripheral vascular disease, unspecified: Secondary | ICD-10-CM | POA: Diagnosis not present

## 2020-04-27 ENCOUNTER — Other Ambulatory Visit: Payer: Self-pay

## 2020-04-27 ENCOUNTER — Encounter (HOSPITAL_BASED_OUTPATIENT_CLINIC_OR_DEPARTMENT_OTHER): Payer: Medicare Other | Admitting: Internal Medicine

## 2020-04-27 DIAGNOSIS — L89153 Pressure ulcer of sacral region, stage 3: Secondary | ICD-10-CM | POA: Diagnosis not present

## 2020-04-27 DIAGNOSIS — S81802D Unspecified open wound, left lower leg, subsequent encounter: Secondary | ICD-10-CM | POA: Diagnosis not present

## 2020-04-27 DIAGNOSIS — E039 Hypothyroidism, unspecified: Secondary | ICD-10-CM | POA: Diagnosis not present

## 2020-04-27 DIAGNOSIS — I110 Hypertensive heart disease with heart failure: Secondary | ICD-10-CM | POA: Diagnosis not present

## 2020-04-27 DIAGNOSIS — I509 Heart failure, unspecified: Secondary | ICD-10-CM | POA: Diagnosis not present

## 2020-04-27 DIAGNOSIS — H269 Unspecified cataract: Secondary | ICD-10-CM | POA: Diagnosis not present

## 2020-04-27 DIAGNOSIS — S40022D Contusion of left upper arm, subsequent encounter: Secondary | ICD-10-CM | POA: Diagnosis not present

## 2020-04-28 DIAGNOSIS — I739 Peripheral vascular disease, unspecified: Secondary | ICD-10-CM | POA: Diagnosis not present

## 2020-04-28 DIAGNOSIS — J9611 Chronic respiratory failure with hypoxia: Secondary | ICD-10-CM | POA: Diagnosis not present

## 2020-04-28 DIAGNOSIS — I110 Hypertensive heart disease with heart failure: Secondary | ICD-10-CM | POA: Diagnosis not present

## 2020-04-28 DIAGNOSIS — I5032 Chronic diastolic (congestive) heart failure: Secondary | ICD-10-CM | POA: Diagnosis not present

## 2020-04-28 DIAGNOSIS — Z4781 Encounter for orthopedic aftercare following surgical amputation: Secondary | ICD-10-CM | POA: Diagnosis not present

## 2020-04-28 DIAGNOSIS — L89152 Pressure ulcer of sacral region, stage 2: Secondary | ICD-10-CM | POA: Diagnosis not present

## 2020-04-29 DIAGNOSIS — J310 Chronic rhinitis: Secondary | ICD-10-CM | POA: Diagnosis not present

## 2020-04-29 DIAGNOSIS — H903 Sensorineural hearing loss, bilateral: Secondary | ICD-10-CM | POA: Diagnosis not present

## 2020-04-29 DIAGNOSIS — L89152 Pressure ulcer of sacral region, stage 2: Secondary | ICD-10-CM | POA: Diagnosis not present

## 2020-04-29 DIAGNOSIS — J343 Hypertrophy of nasal turbinates: Secondary | ICD-10-CM | POA: Diagnosis not present

## 2020-04-29 DIAGNOSIS — I5032 Chronic diastolic (congestive) heart failure: Secondary | ICD-10-CM | POA: Diagnosis not present

## 2020-04-29 DIAGNOSIS — J9611 Chronic respiratory failure with hypoxia: Secondary | ICD-10-CM | POA: Diagnosis not present

## 2020-04-29 DIAGNOSIS — I110 Hypertensive heart disease with heart failure: Secondary | ICD-10-CM | POA: Diagnosis not present

## 2020-04-29 DIAGNOSIS — Z4781 Encounter for orthopedic aftercare following surgical amputation: Secondary | ICD-10-CM | POA: Diagnosis not present

## 2020-04-29 DIAGNOSIS — J342 Deviated nasal septum: Secondary | ICD-10-CM | POA: Diagnosis not present

## 2020-04-29 DIAGNOSIS — H6121 Impacted cerumen, right ear: Secondary | ICD-10-CM | POA: Diagnosis not present

## 2020-04-29 DIAGNOSIS — I739 Peripheral vascular disease, unspecified: Secondary | ICD-10-CM | POA: Diagnosis not present

## 2020-04-30 ENCOUNTER — Telehealth: Payer: Self-pay | Admitting: *Deleted

## 2020-04-30 DIAGNOSIS — J9611 Chronic respiratory failure with hypoxia: Secondary | ICD-10-CM | POA: Diagnosis not present

## 2020-04-30 DIAGNOSIS — L89152 Pressure ulcer of sacral region, stage 2: Secondary | ICD-10-CM | POA: Diagnosis not present

## 2020-04-30 DIAGNOSIS — I739 Peripheral vascular disease, unspecified: Secondary | ICD-10-CM | POA: Diagnosis not present

## 2020-04-30 DIAGNOSIS — Z4781 Encounter for orthopedic aftercare following surgical amputation: Secondary | ICD-10-CM | POA: Diagnosis not present

## 2020-04-30 DIAGNOSIS — I5032 Chronic diastolic (congestive) heart failure: Secondary | ICD-10-CM | POA: Diagnosis not present

## 2020-04-30 DIAGNOSIS — I110 Hypertensive heart disease with heart failure: Secondary | ICD-10-CM | POA: Diagnosis not present

## 2020-04-30 NOTE — Telephone Encounter (Signed)
Patient called c/o soreness and at times redness to his right leg at the amputation site.  Patient is concerned and requested to be seen. An appointment was scheduled 05/05/2020.  Patient is aware to call sooner if symptoms worsen.

## 2020-05-03 DIAGNOSIS — Z9981 Dependence on supplemental oxygen: Secondary | ICD-10-CM | POA: Diagnosis not present

## 2020-05-03 DIAGNOSIS — M6281 Muscle weakness (generalized): Secondary | ICD-10-CM | POA: Diagnosis not present

## 2020-05-03 DIAGNOSIS — R262 Difficulty in walking, not elsewhere classified: Secondary | ICD-10-CM | POA: Diagnosis not present

## 2020-05-03 DIAGNOSIS — I5032 Chronic diastolic (congestive) heart failure: Secondary | ICD-10-CM | POA: Diagnosis not present

## 2020-05-03 DIAGNOSIS — Z4781 Encounter for orthopedic aftercare following surgical amputation: Secondary | ICD-10-CM | POA: Diagnosis not present

## 2020-05-03 DIAGNOSIS — J449 Chronic obstructive pulmonary disease, unspecified: Secondary | ICD-10-CM | POA: Diagnosis not present

## 2020-05-03 DIAGNOSIS — I2510 Atherosclerotic heart disease of native coronary artery without angina pectoris: Secondary | ICD-10-CM | POA: Diagnosis not present

## 2020-05-03 DIAGNOSIS — D649 Anemia, unspecified: Secondary | ICD-10-CM | POA: Diagnosis not present

## 2020-05-03 DIAGNOSIS — L89152 Pressure ulcer of sacral region, stage 2: Secondary | ICD-10-CM | POA: Diagnosis not present

## 2020-05-03 DIAGNOSIS — I110 Hypertensive heart disease with heart failure: Secondary | ICD-10-CM | POA: Diagnosis not present

## 2020-05-03 DIAGNOSIS — Z89611 Acquired absence of right leg above knee: Secondary | ICD-10-CM | POA: Diagnosis not present

## 2020-05-03 DIAGNOSIS — N139 Obstructive and reflux uropathy, unspecified: Secondary | ICD-10-CM | POA: Diagnosis not present

## 2020-05-03 DIAGNOSIS — Z466 Encounter for fitting and adjustment of urinary device: Secondary | ICD-10-CM | POA: Diagnosis not present

## 2020-05-03 DIAGNOSIS — S81802D Unspecified open wound, left lower leg, subsequent encounter: Secondary | ICD-10-CM | POA: Diagnosis not present

## 2020-05-03 DIAGNOSIS — R531 Weakness: Secondary | ICD-10-CM | POA: Diagnosis not present

## 2020-05-03 DIAGNOSIS — E871 Hypo-osmolality and hyponatremia: Secondary | ICD-10-CM | POA: Diagnosis not present

## 2020-05-03 DIAGNOSIS — I739 Peripheral vascular disease, unspecified: Secondary | ICD-10-CM | POA: Diagnosis not present

## 2020-05-03 DIAGNOSIS — I480 Paroxysmal atrial fibrillation: Secondary | ICD-10-CM | POA: Diagnosis not present

## 2020-05-03 DIAGNOSIS — J9611 Chronic respiratory failure with hypoxia: Secondary | ICD-10-CM | POA: Diagnosis not present

## 2020-05-03 DIAGNOSIS — R339 Retention of urine, unspecified: Secondary | ICD-10-CM | POA: Diagnosis not present

## 2020-05-03 DIAGNOSIS — I87322 Chronic venous hypertension (idiopathic) with inflammation of left lower extremity: Secondary | ICD-10-CM | POA: Diagnosis not present

## 2020-05-03 NOTE — Progress Notes (Signed)
COMMUNITY PALLIATIVE CARE RN NOTE  PATIENT NAME: [name] DOB: 1929/08/01 MRN: [id number]  PRIMARY CARE PROVIDER: [name], MD  RESPONSIBLE PARTY: [name] (wife) Acct ID - Guarantor Home Phone Work Phone Relationship Acct Type  [id number] - [name], [name] [phone]  Self P/F     [address], [place], [place] [address]   Due to the COVID-19 crisis, this virtual check-in visit was done via telephone from my office and it was initiated and consent by this patient and or family.  PLAN OF CARE and INTERVENTION:  1. ADVANCE CARE PLANNING/GOALS OF CARE: Goal is for patient to remain at home with his wife. He is a Full code 2. PATIENT/CAREGIVER EDUCATION: Symptom management, safe mobility/transfers, fall prevention 3. DISEASE STATUS: Virtual check-in visit completed via telephone. Patient recently had a procedure to get a bladder mass removed. Patient states that there were no complications from the procedure. Wife states that the Urologist wanted patient to do a voiding trial in hopes that by having the mass removed, he would be able to urinate on his own. However patient failed and will have to keep his foley catheter in. He had a recent hospitalization on 3/31 s/p a fall during a transfer and sustained a laceration to his left lower extremity requiring 14 sutures. While there he went into afib w/RVR and was placed on Amiodarone. Patient feels that this medication is causing him to become more short of breath and tired, and has placed a call to his Cardiologist to see if this can be discontinued. Wife is now requesting another hospice consult due to his progressive weakness and overall functional and physical decline. [facility] hospice now agrees to admit patient while being on his Trilogy machine. Advised I will place a call to PCP to request an order for a hospice consult. Wife is Patent attorney. He has an appointment with his PCP this evening and an appointment with Cardiology  tomorrow. She is hoping that she will be able to get patient there because transfers in and out of the car are becoming more difficult. Will continue to monitor.   HISTORY OF PRESENT ILLNESS: This is a 87 yo male with a h/o CHF, PVD, CAD COPD, NSTEMI and R BKA. Palliative care team continues to follow patient and visits monthly and PRN    CODE STATUS: Full code ADVANCED DIRECTIVES: Y MOST FORM: no PPS: 40%   (Duration of visit and documentation 30 minutes)   [name], RN BSN

## 2020-05-04 ENCOUNTER — Encounter (HOSPITAL_BASED_OUTPATIENT_CLINIC_OR_DEPARTMENT_OTHER): Payer: Medicare Other | Admitting: Internal Medicine

## 2020-05-04 ENCOUNTER — Other Ambulatory Visit: Payer: Self-pay

## 2020-05-04 DIAGNOSIS — L89153 Pressure ulcer of sacral region, stage 3: Secondary | ICD-10-CM

## 2020-05-04 DIAGNOSIS — H269 Unspecified cataract: Secondary | ICD-10-CM | POA: Diagnosis not present

## 2020-05-04 DIAGNOSIS — E039 Hypothyroidism, unspecified: Secondary | ICD-10-CM | POA: Diagnosis not present

## 2020-05-04 DIAGNOSIS — S40022D Contusion of left upper arm, subsequent encounter: Secondary | ICD-10-CM | POA: Diagnosis not present

## 2020-05-04 DIAGNOSIS — S81802D Unspecified open wound, left lower leg, subsequent encounter: Secondary | ICD-10-CM

## 2020-05-04 DIAGNOSIS — I110 Hypertensive heart disease with heart failure: Secondary | ICD-10-CM | POA: Diagnosis not present

## 2020-05-04 DIAGNOSIS — L98491 Non-pressure chronic ulcer of skin of other sites limited to breakdown of skin: Secondary | ICD-10-CM | POA: Diagnosis not present

## 2020-05-04 DIAGNOSIS — I509 Heart failure, unspecified: Secondary | ICD-10-CM | POA: Diagnosis not present

## 2020-05-04 NOTE — Progress Notes (Addendum)
[name], [name] ([id number]) Visit Report for 05/04/2020 Chief Complaint Document Details Patient Name: Date of Service: DO RN, [place] V ID L. 05/04/2020 2:30 PM Medical Record Number: [id number] Patient Account Number: [id number] Date of Birth/Sex: Treating RN: [birth year] (87 y.o. [name] Primary Care Provider: Lona [name] Other Clinician: Referring Provider: Treating Provider/Extender: [name] in Treatment: 25 Information Obtained from: Patient Chief Complaint Right forearm skin tear 04/21/2019; patient is here for review of wound on the right medial great toe 06/23/2019: patient has new skin tears to the right forearm and left elbow 11/11/2019; patient is here for a wound on the left lateral heel and sacrum 04/29/2020; sacral wound and left medial lower leg trauma wound Electronic Signature(s) Signed: 05/04/2020 5:43:46 PM By: Travis Shan DO Entered By: [name] on 05/04/2020 17:25:02 -------------------------------------------------------------------------------- HPI Details Patient Name: Date of Service: DO RN, Travis V ID L. 05/04/2020 2:30 PM Medical Record Number: [id number] Patient Account Number: [id number] Date of Birth/Sex: Treating RN: 03/15/1929 (87 y.o. [name] Primary Care Provider: Lona [name] Other Clinician: Referring Provider: Treating Provider/Extender: [name] in Treatment: 25 History of Present Illness HPI Description: 01/30/18 on evaluation today patient presents for initial inspection concerning the skin tear of the right forearm. Fortunately there does not appear to be any evidence of infection and this occurred approximately three days ago. He has been using Vaseline over the region. With that being said the patient does have a history of cataracts COPD, hypertension, peripheral arterial disease, and osteoarthritis. He tells me that he does have a little bit of pain at the site  but nothing too significant at this time which is good news. Fortunately this overall appears to be doing fairly well which is good news. No fevers, chills, nausea, or vomiting noted at this time. The biggest issue at this point is that the wound is of quite significant size. READMISSION 04/21/2019 This is a 87 year old man with multiple medical problems who was seen once in the clinic here in January 2020 with a skin tear on his right forearm seen by [name]. This problem this time started sometime recently. He was noted by podiatry to have erosions and wounds of his right foot on toes 1-3. He also saw [name] of dermatology who gave him Silvadene cream for what I think was felt to be a stasis dermatitis of his bilateral lower legs. At some point he was referred to [name]. He was also noted to have previous noninvasive arterial studies that showed no flow to either one of his toes and noncompressible ABIs bilaterally. [name] did noninvasive arterial studies on him. These showed that he had greater than 75% stenosis in the proximal and mid common femoral artery. Monophasic flow in the posterior tibial and dorsalis pedis positions on the right foot. ABIs again were noncompressible. He was felt to have total occlusion of the superficial femoral artery on the right. The overall interpretation is that he had severe multilevel arterial occlusive disease. Absent femoral pulses and superficial femoral artery occlusions. In spite of this he was not felt to be candidate for arteriography given his bilateral femoral artery occlusions as well he would not be a candidate for endovascular approach secondary to his common femoral artery occlusions and proximal iliac disease. He was not felt to be a candidate for an open infra inguinal bypass. He therefore felt he would need to be monitored over time with the only option being a  right below-knee amputation The patient complains currently of pain at night  when he is up in bed. The pain in the right foot is better when he puts the leg down over the bed side. This is compatible with rest pain. He has very limited activity i.e. is even exhausted getting dressed in the morning because of cardiopulmonary issues therefore it is difficult to gauge claudication I believe he was referred here by Dr. [name] with one of the major issues is whether he would be a candidate for hyperbaric oxygen. He is not a diabetic. Dr. [name] did give him Silvadene cream for dry flaking skin in his bilateral lower extremities, however the patient is concerned about using this in the face of sulfa allergy. 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 This is on the medial aspect extended there is a second area on the plantar aspect. Most of what the patient talks about is claudication with minimal activity [however the patient is limited by his COPD], or even at rest at night. He is not getting a lot of sleep. We use silver collagen on this wound 5/4; the patient has a wound on the tip of his right great toe also on the medial aspect. Setting of severe nonrevascularizable PAD [see description in HPI]. He does not describe any change in his pain. We have been using silver collagen 5/18; 2-week follow-up. The patient has wound on the tip of his right great toe as well as the medial aspect. He has developed new wounds x2 on the right lateral foot and the right lateral calcaneus. These look like ischemic areas. The patient talks about claudication at night. He seems to be more comfortable during the day. We have been using silver collagen to the wounds. I  have previously debrided the eschar on the toes however this comes right back. 6/1; 2-week follow-up. This patient has ischemic wounds on his right foot in the setting of nonunreconstructable PAD [previously reviewed by Dr. Dixon]. He does not really describe severe pain but he does have episodic pain in the right great toe. He has an area on the tip of the right great toe as well as the medial aspect of the right great toe. A new area on the tip of the right fourth toe. He has the area on the right lateral foot fortunately the area on the right lateral heel appears to have epithelialized over. We are using silver collagen to all wounds 06/23/2019 upon evaluation today patient appears to be doing about the same in regard to his foot ulcers. Unfortunately he has 2 new skin tears on his left elbow and the right forearm that were new as of today. He states that it was doing okay until they went to change the dressing and they put some collagen on it that got stuck and somewhat pulled back the skin unfortunately. Fortunately there is no signs of active infection at this time. No fevers, chills, nausea, vomiting, or diarrhea. 6/29; the patient was hospitalized from 6/18 through 6/23 going in with increasing drainage from the wounds on his right foot. He underwent a angiogram on 06/30/2019 by Dr. Donzetta Matters which revealed multilevel arterial disease including occlusion of the  right common femoral artery. He reconstitutes the profunda femoris and the below-knee popliteal artery and appears to have posterior tibial peroneal runoff to the ankle although there is heavy calcification of the popliteal below the knee. He was offered consideration of a right above-knee amputation, palliative and/or hospice care and consideration of a very high risk attempt at bypass. He did receive IV antibiotics. He comes in the clinic today again with the 2 areas that are necrotic on the left great toe and a small punched out area on the  right lateral foot. A lot of the erythema on top of the right foot but no warmth. He has a lot of swelling here. The patient is literally tortured over this decision that he needs to make. I went over this with him in exceptional detail. I do not believe he is a candidate for any form of open bypass surgery. I think his vascular doctors would agree with that. He still has pain at night when he puts his leg up on the bed that is relieved by putting his leg dependent. This is not different from what I remember. He is not septic there is no additional skin breakdown that I can see 7/8 the patient continues to have severe pain in the right foot at night making it difficult for him to rest. There is increasing erythema in the right foot but the foot is cold. I do not think this is an infection I think this is likely to be tissue necrosis from ischemia. I thought he would have made an up decision today about going forward with hospice he has not. He did not go to see [name] on 7/6. Again he wants to talk to me at length about amputations. I do not think he will do well after an above-knee amputation on the right. Furthermore I do not think he is a candidate for bypass surgery on the right leg 7/20; patient still having a lot of pain in the right foot. He saw Dr. Donzetta Matters on 7/16. He is not felt to be able to undergo a complex revascularization and I certainly agree with that. He was offered a right below-knee amputation but he has not made that decision. I have repetitively suggested hospice care if he will not undergo an amputation he is not decided he wants to go that route either. He arrives in the clinic today accompanied by his son who is a Stage manager and his wife. I think the patient has tissue breakdown in the dorsal foot secondary to ischemia although his son brought up that some of the erythema could be a contact dermatitis as they were apparently layering a large area of pure call over the wound  area. I was not aware of this not supposed to be doing it in this fashion. In any case the erythema look better than last week and I am quite convinced this is not cellulitis READMISSION 11/11/2019 Mr. Paula Libra is now an 87 year old man who we had in this clinic from [place] through July/21 largely as a result of ischemic wounds in his right foot. He was followed by vein and vascular [name]. His anatomy was not felt to be amenable to a revascularization. The patient had increasing ischemic pain but was very resistant to the notion of an amputation. Eventually he did go through with this on August 22 he will underwent an AKA by [name]. He went to rehabilitation. There he developed a small stage III lower sacral pressure ulcer and  an area on his left lateral heel which is also probably a pressure ulcer. They have been using silver collagen on both wound areas. The patient states he is not in any unrelieved pain. He is in a wheelchair at home with a Roho cushion. He is able to do his own transfers. I think he is probably putting far too much pressure on these areas Past medical history is reviewed. He has a history of CHF, severe PAD, hypertension, coronary artery disease status post CABG, COPD Gold stage IV on O2, hypothyroidism and a recent problem with urinary retention he now has a Foley catheter. He has had problems with left leg swelling and adjustment of his diuretics he is followed by Dr. Daneen Schick. The patient has severe PAD. On 06/30/2019 he had an angiogram on the remaining left side he had heavily diseased femoral artery. His superficial femoral artery was flush occluded with runoff in the profunda. He was felt his SFA was likely occluded and reconstitutes the popliteal artery via runoff via the peroneal and posterior tibial artery. As noted he is not in a lot of pain. His last noninvasive study was on 6/20. On the left side this was noncompressible ABI but a TBI of only 0.20 with  dampened monophasic waveforms 11/25/2019. Patient has 2 wound areas 1 on the left lateral heel and a ulcer on his coccyx. These are probably all pressure related although he has significant PAD complicating any attempt to heal a wound on the left heel. We have been using silver collagen on the wound and the coccyx silver alginate on the heel. 11/30; miraculously the left lateral heel wound has healed. The area on his coccyx is not much better. We have been using Prisma He apparently went back to see [name]. His wife paraphrasing says that he had terrible blood flow in the left lower extremity but he is not a candidate for revascularization. Fortunately had enough blood flow to heal the wound I will try to look at Dr. Mee Hives note by the time these next year 12/14; the area on his left lateral heel remains closed The area on his coccyx is about the same in terms of surface area and depth minimal debris however but it is still adherent. We have been using Iodoflex. They stop me before I left the room to show me his right AKA stump. He tells me he is having a lot of pain in this area that is episodic it almost looks like there is bruising in this area but the bruising looks old I asked him about trauma but they were not aware of any. They see vascular tomorrow 12/28; the area on the coccyx looks absolutely no better. Completely lifeless surface of fibrinous debris and surrounding raised skin. We have been using Iodoflex this is simply not working. He had a fall when he got up forgetting that he was missing his right leg he has skin tears on the right humerus area just above the elbow and on the right forearm. 1/11; the area on the coccyx is about the same we have been using Santyl although the surface of this is no better. Small punched out wound. He also has skin tears on his arm which are drying up gradually. His wife had called urgently yesterday to be seen. Apparently he developed erythema and  swelling of his left foot over the weekend and she was able to express a teaspoon of pus coming from the dorsal foot. She had called her  podiatrist who prescribed Keflex however in response to advice from our office she took him to the emergency room. They apparently gave him IV antibiotics and discharged him on doxycycline. His white count was 14.5 with 92% neutrophils. He is not systemically unwell and does not really complain of a lot of pain 3/1; patient has not been here in about 7 weeks. In the interim he was hospitalized on 2 occasions from 01/23/2020 through 02/05/2020. The original admission was for cellulitis of the left leg he received IV antibiotics and Augmentin. It was noted that the right AKA stump broke down ultimately requiring a revision of the AKA by vein and vascular and he went to rehab medicine in hospital from 1/27 through 02/16/2020 He is back at home now. The only wound he has is on the lower coccyx everything else is closed. He is not complaining of any pain in the left leg although there is edema up into his mid thigh. He apparently has had his Lasix increased and this was felt to be secondary to congestive heart failure 3/22. The patient is followed here for a small wound on his lower sacrum/coccyx. He has been using silver collagen the wound is small with some depth. They asked me to look at a fairly large annular lesion on the left anterior lower leg this is not scaled or painful. It is not pruritic. The patient's wife tells me this just came up over the weekend past 4/12; patient still has a very tiny open area on the lower sacrum/coccyx. I changed him to Iodoflex last time or Iodosorb ointment unknown though there were making too much of a difference here. Since he was last here he fell suffered a laceration on his left medial lower leg that required suturing. He ended up getting admitted from 3/31 through 4/3 apparently for apparently atrial fibrillation. He was put on  amiodarone. He returned home he has not been able to tolerate the amiodarone and stopped that. Apparently he was on topical all that was discontinued as well because his blood pressure was too low. He has 3-4+ pitting edema in the left lower leg 4/19; the patient has been using Iodosorb gel to the sacral wound region and silver alginate to the trauma wound on his left leg. He continues to have stitches in place to the left leg wound. Per wife he is not able to offload on the sacral region very well. 4/26; patient presents for 1 week follow-up. He has been using collagen to the sacral wound and silver alginate to the left traumatic leg wound with Kerlix and Coban. He continues to have the stitches in place to his left lower leg. He also developed an area of skin breakdown on his nose from his CPAP machine. Electronic Signature(s) Signed: 05/04/2020 5:43:46 PM By: Travis Shan DO Entered By: [name] on 05/04/2020 17:28:10 -------------------------------------------------------------------------------- Physical Exam Details Patient Name: Date of Service: DO RN, Travis V ID L. 05/04/2020 2:30 PM Medical Record Number: [id number] Patient Account Number: [id number] Date of Birth/Sex: Treating RN: [birth year] (87 y.o. [name] Primary Care Provider: Lona [name] Other Clinician: Referring Provider: Treating Provider/Extender: [name] in Treatment: 25 Constitutional respirations regular, non-labored and within target range for patient.. Cardiovascular 2+ dorsalis pedis/posterior tibialis pulses. Psychiatric pleasant and cooperative. Notes Sacral wound: Small opening with mild erythema to the surrounding skin. Left medial lower leg: Laceration with sutures in place. After suture removal skin flap appears viable except for the tip. Underneath fat  layer is exposed. 2+ pitting edema abrasion to the bride of his nose limited to skin break down. no signs  of infection to any wound Electronic Signature(s) Signed: 05/04/2020 5:43:46 PM By: Travis Shan DO Entered By: [name] on 05/04/2020 17:42:43 -------------------------------------------------------------------------------- Physician Orders Details Patient Name: Date of Service: DO RN, Travis V ID L. 05/04/2020 2:30 PM Medical Record Number: [id number] Patient Account Number: [id number] Date of Birth/Sex: Treating RN: 1929-04-26 (87 y.o. 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 - with Dr. Dellia Nims Return A Bathing/ Shower/ Hygiene May shower and wash wound with soap and water. - with dressing changes only. Edema Control - Lymphedema / SCD / Other Elevate legs to the level of the heart or above for 30 minutes daily and/or when sitting, a frequency of: - throughout the day. Off-Loading Turn and reposition every 2 hours [place] wound care orders this week; continue Home Health for wound care. May utilize formulary equivalent dressing for wound treatment orders unless otherwise specified. - home health DO NOT change left leg wrap. left leg dressing to be changed at wound center Other Home Health Orders/Instructions: - [facility] Wound #15 - Sacrum Cleanser: Wound Cleanser ([place]) Every Other Day/30 Days Discharge Instructions: Cleanse the wound with wound cleanser prior to applying a clean dressing using gauze sponges, not tissue or cotton balls. Peri-Wound Care: Skin Prep [facility]) Every Other Day/30 Days Discharge Instructions:  Use skin prep as directed Prim Dressing: Promogran Prisma Matrix, 4.34 (sq in) (silver collagen) (Home Health) Every Other Day/30 Days ary Discharge Instructions: Moisten collagen with saline or hydrogel Secondary Dressing: ComfortFoam Border, 3x3 in (silicone border) [facility]) Every Other Day/30 Days Discharge Instructions: Apply over primary dressing as directed. Wound #18 - Lower Leg Wound Laterality: Left, Medial Cleanser: Soap and Water (Home Health) 1 x Per Week/Other:at wound center Discharge Instructions: May shower and wash wound with dial antibacterial soap and water prior to dressing change. Cleanser: Wound Cleanser (Home Health) 1 x Per Week/Other:at wound center Discharge Instructions: Cleanse the wound with wound cleanser prior to applying a clean dressing using gauze sponges, not tissue or cotton balls. Cleanser: Wound Cleanser (Home Health) 1 x Per Week/Other:at wound center Discharge Instructions: Cleanse the wound with wound cleanser prior to applying a clean dressing using gauze sponges, not tissue or cotton balls. Peri-Wound Care: Triamcinolone 15 (g) (Home Health) 1 x Per Week/Other:at wound center Discharge Instructions: Use triamcinolone 15 (g) as directed Peri-Wound Care: Sween Lotion (Moisturizing lotion) (Home Health) 1 x Per Week/Other:at wound center Discharge Instructions: Apply moisturizing lotion as directed Prim Dressing: Promogran Prisma Matrix, 4.34 (sq in) (silver collagen) (Home Health) 1 x Per Week/Other:at wound center ary Discharge Instructions: Moisten collagen with saline or hydrogel and place under flap Secondary Dressing: Woven Gauze Sponge, Non-Sterile 4x4 in (Home Health) 1 x Per Week/Other:at wound center Discharge Instructions: Apply over primary dressing as directed. Compression Wrap: Kerlix Roll 4.5x3.1 (in/yd) (Home Health) 1 x Per Week/Other:at wound center Discharge Instructions: erlix/coban wrap should be done very lightl Compression  Wrap: Coban Self-Adherent Wrap 4x5 (in/yd) (Home Health) 1 x Per Week/Other:at wound center Discharge Instructions: kerlix/coban wrap should  be done very lightl Wound #19 - Nose Topical: Triple Antibiotic Ointment, 1 (oz) Tube 1 x Per Day/30 Days Secondary Dressing: bandaid ([place]) 1 x Per Day/30 Days Electronic Signature(s) Signed: 05/04/2020 5:43:46 PM By: Travis Shan DO Signed: 05/04/2020 6:28:10 PM By: Baruch Gouty RN, BSN Entered By: Baruch Gouty on 05/04/2020 16:32:19 -------------------------------------------------------------------------------- Problem List Details Patient Name: Date of Service: DO RN, Travis V ID L. 05/04/2020 2:30 PM Medical Record Number: [id number] Patient Account Number: [id number] Date of Birth/Sex: Treating RN: October 17, 1929 (87 y.o. 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 11/11/2019 No Yes L98.491 Non-pressure chronic ulcer of skin of other sites limited to breakdown of skin 05/04/2020 No Yes Inactive Problems ICD-10 Code Description Active Date Inactive Date L89.623 Pressure ulcer of left heel, stage 3 11/11/2019 11/11/2019 L03.116 Cellulitis of left lower limb 01/20/2020 01/20/2020 Resolved Problems ICD-10 Code Description Active Date Resolved Date S40.022D Contusion of left upper arm, subsequent encounter 01/06/2020 01/06/2020 Electronic Signature(s) Signed: 05/04/2020 5:43:46 PM By: Travis Shan DO Entered By: [name] on 05/04/2020  17:31:40 -------------------------------------------------------------------------------- Progress Note Details Patient Name: Date of Service: DO RN, Travis V ID L. 05/04/2020 2:30 PM Medical Record Number: [id number] Patient Account Number: [id number] Date of Birth/Sex: Treating RN: 24-Jun-1929 (87 y.o. [name] Primary Care Provider: Lona [name] Other Clinician: Referring Provider: Treating Provider/Extender: [name] in Treatment: 25 Subjective Chief Complaint Information obtained from Patient Right forearm skin tear 04/21/2019; patient is here for review of wound on the right medial great toe 06/23/2019: patient has new skin tears to the right forearm and left elbow 11/11/2019; patient is here for a wound on the left lateral heel and sacrum 04/29/2020; sacral wound and left medial lower leg trauma wound History of Present Illness (HPI) 01/30/18 on evaluation today patient presents for initial inspection concerning the skin tear of the right forearm. Fortunately there does not appear to be any evidence of infection and this occurred approximately three days ago. He has been using Vaseline over the region. With that being said the patient does have a history of cataracts COPD, hypertension, peripheral arterial disease, and osteoarthritis. He tells me that he does have a little bit of pain at the site but nothing too significant at this time which is good news. Fortunately this overall appears to be doing fairly well which is good news. No fevers, chills, nausea, or vomiting noted at this time. The biggest issue at this point is that the wound is of quite significant size. READMISSION 04/21/2019 This is a 87 year old man with multiple medical problems who was seen once in the clinic here in January 2020 with a skin tear on his right forearm seen by [name]. This problem this time started sometime recently. He was noted by podiatry to have erosions and wounds  of his right foot on toes 1-3. He also saw [name] of dermatology who gave him Silvadene cream for what I think was felt to be a stasis dermatitis of his bilateral lower legs. At some point he was referred to [name]. He was also noted to have previous noninvasive arterial studies that showed no flow to either one  of his toes and noncompressible ABIs bilaterally. [name] did noninvasive arterial studies on him. These showed that he had greater than 75% stenosis in the proximal and mid common femoral artery. Monophasic flow in the posterior tibial and dorsalis pedis positions on the right foot. ABIs again were noncompressible. He was felt to have total occlusion of the superficial femoral artery on the right. The overall interpretation is that he had severe multilevel arterial occlusive disease. Absent femoral pulses and superficial femoral artery occlusions. In spite of this he was not felt to be candidate for arteriography given his bilateral femoral artery occlusions as well he would not be a candidate for endovascular approach secondary to his common femoral artery occlusions and proximal iliac disease. He was not felt to be a candidate for an open infra inguinal bypass. He therefore felt he would need to be monitored over time with the only option being a right below-knee amputation The patient complains currently of pain at night when he is up in bed. The pain in the right foot is better when he puts the leg down over the bed side. This is compatible with rest pain. He has very limited activity i.e. is even exhausted getting dressed in the morning because of cardiopulmonary issues therefore it is difficult to gauge claudication I believe he was referred here by Dr. [name] with one of the major issues is whether he would be a candidate for hyperbaric oxygen. He is not a diabetic. Dr. [name] did give him Silvadene cream for dry flaking skin in his bilateral lower extremities, however the  patient is concerned about using this in the face of sulfa allergy. 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 This is on the medial aspect extended there is a second area on the plantar aspect. Most of what the patient talks about is claudication with minimal activity [however the patient is limited by his COPD], or even at rest at night. He is not getting a lot of sleep. We use silver collagen on this wound 5/4; the patient has a wound on the tip of his right great toe also on the medial aspect. Setting of severe nonrevascularizable PAD [see description in HPI]. He does not describe any change in his pain. We have been using silver collagen 5/18; 2-week follow-up. The patient has wound on the tip of his right great toe as well as the medial aspect. He has developed new wounds x2 on the right lateral foot and the right lateral calcaneus. These look like ischemic areas. The patient talks about claudication at night. He seems to be more comfortable during the day. We have been using silver collagen to the wounds. I have previously debrided the eschar on the toes however this comes right back. 6/1; 2-week follow-up. This patient has ischemic wounds on his right foot in the setting of nonunreconstructable PAD [previously reviewed by Dr. Dixon]. He does not really describe severe pain but he does have episodic pain in the right great toe. He has an area on the tip of the right great toe as well as the medial aspect of the right great toe. A new area on the tip of the right fourth toe. He  has the area on the right lateral foot fortunately the area on the right lateral  heel appears to have epithelialized over. We are using silver collagen to all wounds 06/23/2019 upon evaluation today patient appears to be doing about the same in regard to his foot ulcers. Unfortunately he has 2 new skin tears on his left elbow and the right forearm that were new as of today. He states that it was doing okay until they went to change the dressing and they put some collagen on it that got stuck and somewhat pulled back the skin unfortunately. Fortunately there is no signs of active infection at this time. No fevers, chills, nausea, vomiting, or diarrhea. 6/29; the patient was hospitalized from 6/18 through 6/23 going in with increasing drainage from the wounds on his right foot. He underwent a angiogram on 06/30/2019 by Dr. Donzetta Matters which revealed multilevel arterial disease including occlusion of the right common femoral artery. He reconstitutes the profunda femoris and the below-knee popliteal artery and appears to have posterior tibial peroneal runoff to the ankle although there is heavy calcification of the popliteal below the knee. He was offered consideration of a right above-knee amputation, palliative and/or hospice care and consideration of a very high risk attempt at bypass. He did receive IV antibiotics. He comes in the clinic today again with the 2 areas that are necrotic on the left great toe and a small punched out area on the right lateral foot. A lot of the erythema on top of the right foot but no warmth. He has a lot of swelling here. The patient is literally tortured over this decision that he needs to make. I went over this with him in exceptional detail. I do not believe he is a candidate for any form of open bypass surgery. I think his vascular doctors would agree with that. He still has pain at night when he puts his leg up on the bed that is relieved by putting his leg dependent. This is not different from what I remember. He is not septic there is no additional skin  breakdown that I can see 7/8 the patient continues to have severe pain in the right foot at night making it difficult for him to rest. There is increasing erythema in the right foot but the foot is cold. I do not think this is an infection I think this is likely to be tissue necrosis from ischemia. I thought he would have made an up decision today about going forward with hospice he has not. He did not go to see [name] on 7/6. Again he wants to talk to me at length about amputations. I do not think he will do well after an above-knee amputation on the right. Furthermore I do not think he is a candidate for bypass surgery on the right leg 7/20; patient still having a lot of pain in the right foot. He saw Dr. Donzetta Matters on 7/16. He is not felt to be able to undergo a complex revascularization and I certainly agree with that. He was offered a right below-knee amputation but he has not made that decision. I have repetitively suggested hospice care if he will not undergo an amputation he is not decided he wants to go that route either. He arrives in the clinic today accompanied by his son who is a Stage manager and his wife. I think the patient has tissue breakdown in the dorsal foot secondary to ischemia although his son brought up that some of  the erythema could be a contact dermatitis as they were apparently layering a large area of pure call over the wound area. I was not aware of this not supposed to be doing it in this fashion. In any case the erythema look better than last week and I am quite convinced this is not cellulitis READMISSION 11/11/2019 Mr. Paula Libra is now an 87 year old man who we had in this clinic from [place] through July/21 largely as a result of ischemic wounds in his right foot. He was followed by vein and vascular [name]. His anatomy was not felt to be amenable to a revascularization. The patient had increasing ischemic pain but was very resistant to the notion of an amputation.  Eventually he did go through with this on August 22 he will underwent an AKA by [name]. He went to rehabilitation. There he developed a small stage III lower sacral pressure ulcer and an area on his left lateral heel which is also probably a pressure ulcer. They have been using silver collagen on both wound areas. The patient states he is not in any unrelieved pain. He is in a wheelchair at home with a Roho cushion. He is able to do his own transfers. I think he is probably putting far too much pressure on these areas Past medical history is reviewed. He has a history of CHF, severe PAD, hypertension, coronary artery disease status post CABG, COPD Gold stage IV on O2, hypothyroidism and a recent problem with urinary retention he now has a Foley catheter. He has had problems with left leg swelling and adjustment of his diuretics he is followed by Dr. Daneen Schick. The patient has severe PAD. On 06/30/2019 he had an angiogram on the remaining left side he had heavily diseased femoral artery. His superficial femoral artery was flush occluded with runoff in the profunda. He was felt his SFA was likely occluded and reconstitutes the popliteal artery via runoff via the peroneal and posterior tibial artery. As noted he is not in a lot of pain. His last noninvasive study was on 6/20. On the left side this was noncompressible ABI but a TBI of only 0.20 with dampened monophasic waveforms 11/25/2019. Patient has 2 wound areas 1 on the left lateral heel and a ulcer on his coccyx. These are probably all pressure related although he has significant PAD complicating any attempt to heal a wound on the left heel. We have been using silver collagen on the wound and the coccyx silver alginate on the heel. 11/30; miraculously the left lateral heel wound has healed. The area on his coccyx is not much better. We have been using Prisma He apparently went back to see [name]. His wife paraphrasing says that he had terrible  blood flow in the left lower extremity but he is not a candidate for revascularization. Fortunately had enough blood flow to heal the wound I will try to look at Dr. Mee Hives note by the time these next year 12/14; the area on his left lateral heel remains closed ooThe area on his coccyx is about the same in terms of surface area and depth minimal debris however but it is still adherent. We have been using Iodoflex. ooThey stop me before I left the room to show me his right AKA stump. He tells me he is having a lot of pain in this area that is episodic it almost looks like there is bruising in this area but the bruising looks old I asked him about trauma  but they were not aware of any. They see vascular tomorrow 12/28; the area on the coccyx looks absolutely no better. Completely lifeless surface of fibrinous debris and surrounding raised skin. We have been using Iodoflex this is simply not working. ooHe had a fall when he got up forgetting that he was missing his right leg he has skin tears on the right humerus area just above the elbow and on the right forearm. 1/11; the area on the coccyx is about the same we have been using Santyl although the surface of this is no better. Small punched out wound. He also has skin tears on his arm which are drying up gradually. His wife had called urgently yesterday to be seen. Apparently he developed erythema and swelling of his left foot over the weekend and she was able to express a teaspoon of pus coming from the dorsal foot. She had called her podiatrist who prescribed Keflex however in response to advice from our office she took him to the emergency room. They apparently gave him IV antibiotics and discharged him on doxycycline. His white count was 14.5 with 92% neutrophils. He is not systemically unwell and does not really complain of a lot of pain 3/1; patient has not been here in about 7 weeks. In the interim he was hospitalized on 2 occasions from  01/23/2020 through 02/05/2020. The original admission was for cellulitis of the left leg he received IV antibiotics and Augmentin. It was noted that the right AKA stump broke down ultimately requiring a revision of the AKA by vein and vascular and he went to rehab medicine in hospital from 1/27 through 02/16/2020 He is back at home now. The only wound he has is on the lower coccyx everything else is closed. He is not complaining of any pain in the left leg although there is edema up into his mid thigh. He apparently has had his Lasix increased and this was felt to be secondary to congestive heart failure 3/22. The patient is followed here for a small wound on his lower sacrum/coccyx. He has been using silver collagen the wound is small with some depth. ooThey asked me to look at a fairly large annular lesion on the left anterior lower leg this is not scaled or painful. It is not pruritic. The patient's wife tells me this just came up over the weekend past 4/12; patient still has a very tiny open area on the lower sacrum/coccyx. I changed him to Iodoflex last time or Iodosorb ointment unknown though there were making too much of a difference here. Since he was last here he fell suffered a laceration on his left medial lower leg that required suturing. He ended up getting admitted from 3/31 through 4/3 apparently for apparently atrial fibrillation. He was put on amiodarone. He returned home he has not been able to tolerate the amiodarone and stopped that. Apparently he was on topical all that was discontinued as well because his blood pressure was too low. He has 3-4+ pitting edema in the left lower leg 4/19; the patient has been using Iodosorb gel to the sacral wound region and silver alginate to the trauma wound on his left leg. He continues to have stitches in place to the left leg wound. Per wife he is not able to offload on the sacral region very well. 4/26; patient presents for 1 week follow-up. He  has been using collagen to the sacral wound and silver alginate to the left traumatic leg wound with Kerlix  and Coban. He continues to have the stitches in place to his left lower leg. He also developed an area of skin breakdown on his nose from his CPAP machine. Patient History Information obtained from Patient. Family History Cancer - Mother, Hypertension - Mother,Siblings, Stroke - Siblings, No family history of Diabetes, Heart Disease, Hereditary Spherocytosis, Kidney Disease, Lung Disease, Seizures, Thyroid Problems, Tuberculosis. Social History Former smoker - quit 1984 - ended on 01/09/1982, Marital Status - Married, Alcohol Use - Moderate, Drug Use - No History, Caffeine Use - Daily. Medical History Eyes Patient has history of Cataracts Denies history of Glaucoma, Optic Neuritis Ear/Nose/Mouth/Throat Denies history of Chronic sinus problems/congestion, Middle ear problems Hematologic/Lymphatic Denies history of Anemia, Hemophilia, Human Immunodeficiency Virus, Lymphedema, Sickle Cell Disease Respiratory Patient has history of Asthma, Chronic Obstructive Pulmonary Disease (COPD) Denies history of Aspiration, Pneumothorax, Sleep Apnea, Tuberculosis Cardiovascular Patient has history of Congestive Heart Failure, Coronary Artery Disease, Hypertension, Peripheral Arterial Disease Denies history of Angina, Arrhythmia, Deep Vein Thrombosis, Hypotension, Peripheral Venous Disease, Phlebitis, Vasculitis Gastrointestinal Denies history of Cirrhosis , Colitis, Crohnoos, Hepatitis A, Hepatitis B, Hepatitis C Endocrine Denies history of Type I Diabetes, Type II Diabetes Genitourinary Denies history of End Stage Renal Disease Immunological Denies history of Lupus Erythematosus, Raynaudoos, Scleroderma Integumentary (Skin) Denies history of History of Burn Musculoskeletal Patient has history of Osteoarthritis Denies history of Gout, Rheumatoid Arthritis, Osteomyelitis Neurologic Denies  history of Dementia, Neuropathy, Quadriplegia, Paraplegia, Seizure Disorder Oncologic Denies history of Received Chemotherapy, Received Radiation Psychiatric Patient has history of Confinement Anxiety Denies history of Anorexia/bulimia Hospitalization/Surgery History - copd. - seizure. - CABG 4 vessels. - right AKA. Medical A Surgical History Notes nd Ear/Nose/Mouth/Throat chronic rhinitis Cardiovascular carotid artery occlusion, hyperlipidemia Gastrointestinal GERD, gallstones Endocrine hypothyroidism Genitourinary BPH, hx kidney stones, ureteral tumor, urinary retention, foley cath Oncologic squamous cell carcinoma of skin Objective Constitutional respirations regular, non-labored and within target range for patient.. Vitals Time Taken: 3:00 PM, Height: 69 in, Temperature: 98.7 F, Pulse: 98 bpm, Respiratory Rate: 18 breaths/min, Blood Pressure: 106/67 mmHg. Cardiovascular 2+ dorsalis pedis/posterior tibialis pulses. Psychiatric pleasant and cooperative. General Notes: Sacral wound: Small opening with mild erythema to the surrounding skin. Left medial lower leg: Laceration with sutures in place. After suture removal skin flap appears viable except for the tip. Underneath fat layer is exposed. 2+ pitting edema abrasion to the bride of his nose limited to skin break down. no signs of infection to any wound Integumentary (Hair, Skin) Wound #15 status is Open. Original cause of wound was Pressure Injury. The date acquired was: 08/01/2019. The wound has been in treatment 25 weeks. The wound is located on the Sacrum. The wound measures 0.5cm length x 0.2cm width x 0.3cm depth; 0.079cm^2 area and 0.024cm^3 volume. There is Fat Layer (Subcutaneous Tissue) exposed. There is no undermining noted, however, there is tunneling at 12:00 with a maximum distance of 0.3cm. There is a medium amount of serosanguineous drainage noted. The wound margin is epibole. There is large (67-100%) pink, pale  granulation within the wound bed. There is no necrotic tissue within the wound bed. Wound #18 status is Open. Original cause of wound was Trauma. The date acquired was: 04/08/2020. The wound has been in treatment 2 weeks. The wound is located on the Left,Medial Lower Leg. The wound measures 2.3cm length x 1cm width x 0.1cm depth; 1.806cm^2 area and 0.181cm^3 volume. There is Fat Layer (Subcutaneous Tissue) exposed. There is no tunneling or undermining noted. There is a medium amount of serosanguineous  drainage noted. The wound margin is flat and intact. There is no granulation within the wound bed. There is a large (67-100%) amount of necrotic tissue within the wound bed including Adherent Slough. General Notes: 8 sutures noted. Wound #19 status is Open. Original cause of wound was Pressure Injury. The date acquired was: 04/20/2020. The wound is located on the Nose. The wound measures 0.6cm length x 0.3cm width x 0.1cm depth; 0.141cm^2 area and 0.014cm^3 volume. There is Fat Layer (Subcutaneous Tissue) exposed. There is no tunneling or undermining noted. There is a small amount of serosanguineous drainage noted. The wound margin is distinct with the outline attached to the wound base. There is large (67-100%) red granulation within the wound bed. There is no necrotic tissue within the wound bed. 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 We will continue with collagen every other day. Patient had a laceration repair 1 month ago in the ED. The remaining sutures were removed from the left lower leg today. They have been in for a month and I don't believe they are adding any benefit since the flap did not heal along the sides. Once removed The wound goes  down to the fat layer. I will put collagen underneath and continue with the flap over it. The flap may adhere and heal however, will likely need to be removed eventually. I will put Kerlix and Coban over this and have him follow-up in 1 week. He also developed a wound to the bridge of his nose from a CPAP machine. It is scabbed over and I recommended using antibiotic ointment Plan Follow-up Appointments: Return Appointment in 1 week. - with Dr. Dellia Nims Bathing/ Shower/ Hygiene: May shower and wash wound with soap and water. - with dressing changes only. Edema Control - Lymphedema / SCD / Other: Elevate legs to the level of the heart or above for 30 minutes daily and/or when sitting, a frequency of: - throughout the day. Off-Loading: Turn and reposition every 2 hours Home Health: New wound care orders this week; continue Home Health for wound care. May utilize formulary equivalent dressing for wound treatment orders unless otherwise specified. - home health DO NOT change left leg wrap. left leg dressing to be changed at wound center Other Home Health Orders/Instructions: - [facility] WOUND #15: - Sacrum Wound Laterality: Cleanser: Wound Cleanser (Home Health) Every Other Day/30 Days Discharge Instructions: Cleanse the wound with wound cleanser prior to applying a clean dressing using gauze sponges, not tissue or cotton balls. Peri-Wound Care: Skin Prep [facility]) Every Other Day/30 Days Discharge Instructions: Use skin prep as directed Prim Dressing: Promogran Prisma Matrix, 4.34 (sq in) (silver collagen) (Home Health) Every Other Day/30 Days ary Discharge Instructions: Moisten collagen with saline or hydrogel Secondary Dressing: ComfortFoam Border, 3x3 in (silicone border) [facility]) Every Other Day/30 Days Discharge Instructions: Apply over primary dressing as directed. WOUND #18: - Lower Leg Wound Laterality: Left, Medial Cleanser: Soap and Water (Home Health) 1 x Per Week/Other:at  wound center Discharge Instructions: May shower and wash wound with dial antibacterial soap and water prior to dressing change. Cleanser: Wound Cleanser (Home Health) 1 x Per Week/Other:at wound center Discharge Instructions: Cleanse the wound with wound cleanser prior to applying a clean dressing using gauze sponges, not tissue or cotton  balls. Cleanser: Wound Cleanser (Home Health) 1 x Per Week/Other:at wound center Discharge Instructions: Cleanse the wound with wound cleanser prior to applying a clean dressing using gauze sponges, not tissue or cotton balls. Peri-Wound Care: Triamcinolone 15 (g) (Home Health) 1 x Per Week/Other:at wound center Discharge Instructions: Use triamcinolone 15 (g) as directed Peri-Wound Care: Sween Lotion (Moisturizing lotion) (Home Health) 1 x Per Week/Other:at wound center Discharge Instructions: Apply moisturizing lotion as directed Prim Dressing: Promogran Prisma Matrix, 4.34 (sq in) (silver collagen) (Home Health) 1 x Per Week/Other:at wound center ary Discharge Instructions: Moisten collagen with saline or hydrogel and place under flap Secondary Dressing: Woven Gauze Sponge, Non-Sterile 4x4 in (Home Health) 1 x Per Week/Other:at wound center Discharge Instructions: Apply over primary dressing as directed. Com pression Wrap: Kerlix Roll 4.5x3.1 (in/yd) (Home Health) 1 x Per Week/Other:at wound center Discharge Instructions: erlix/coban wrap should be done very lightl Com pression Wrap: Coban Self-Adherent Wrap 4x5 (in/yd) (Home Health) 1 x Per Week/Other:at wound center Discharge Instructions: kerlix/coban wrap should be done very lightl WOUND #19: - Nose Wound Laterality: Topical: Triple Antibiotic Ointment, 1 (oz) Tube 1 x Per Day/30 Days Secondary Dressing: bandaid (Superior) 1 x Per Day/30 Days 1. Silver collagen every other day to the sacral wound. Aggressive offloading 2. Collagen underneath the skin flap to the left lower leg covered with  Kerlix/Coban 3. Follow-up in 1 week 4. Antibiotic ointment to the bridge of his nose Electronic Signature(s) Signed: 05/18/2020 3:23:56 PM By: Travis Shan DO Previous Signature: 05/04/2020 5:43:46 PM Version By: Travis Shan DO Entered By: [name] on 05/10/2020 14:37:39 -------------------------------------------------------------------------------- HxROS Details Patient Name: Date of Service: DO RN, Travis V ID L. 05/04/2020 2:30 PM Medical Record Number: [id number] Patient Account Number: [id number] Date of Birth/Sex: Treating RN: 1929-03-26 (87 y.o. [name] Primary Care Provider: Lona [name] Other Clinician: Referring Provider: Treating Provider/Extender: [name] in Treatment: 25 Information Obtained From Patient Eyes Medical History: Positive for: Cataracts Negative for: Glaucoma; Optic Neuritis Ear/Nose/Mouth/Throat Medical History: Negative for: Chronic sinus problems/congestion; Middle ear problems Past Medical History Notes: chronic rhinitis Hematologic/Lymphatic Medical History: Negative for: Anemia; Hemophilia; Human Immunodeficiency Virus; Lymphedema; Sickle Cell Disease Respiratory Medical History: Positive for: Asthma; Chronic Obstructive Pulmonary Disease (COPD) Negative for: Aspiration; Pneumothorax; Sleep Apnea; Tuberculosis Cardiovascular Medical History: Positive for: Congestive Heart Failure; Coronary Artery Disease; Hypertension; Peripheral Arterial Disease Negative for: Angina; Arrhythmia; Deep Vein Thrombosis; Hypotension; Peripheral Venous Disease; Phlebitis; Vasculitis Past Medical History Notes: carotid artery occlusion, hyperlipidemia Gastrointestinal Medical History: Negative for: Cirrhosis ; Colitis; Crohns; Hepatitis A; Hepatitis B; Hepatitis C Past Medical History Notes: GERD, gallstones Endocrine Medical History: Negative for: Type I Diabetes; Type II Diabetes Past Medical History  Notes: hypothyroidism Genitourinary Medical History: Negative for: End Stage Renal Disease Past Medical History Notes: BPH, hx kidney stones, ureteral tumor, urinary retention, foley cath Immunological Medical History: Negative for: Lupus Erythematosus; Raynauds; Scleroderma Integumentary (Skin) Medical History: Negative for: History of Burn Musculoskeletal Medical History: Positive for: Osteoarthritis Negative for: Gout; Rheumatoid Arthritis; Osteomyelitis Neurologic Medical History: Negative for: Dementia; Neuropathy; Quadriplegia; Paraplegia; Seizure Disorder Oncologic Medical History: Negative for: Received Chemotherapy; Received Radiation Past Medical History Notes: squamous cell carcinoma of skin Psychiatric Medical History: Positive for: Confinement Anxiety Negative for: Anorexia/bulimia HBO Extended History Items Eyes: Cataracts Immunizations Pneumococcal Vaccine: Received Pneumococcal Vaccination: Yes Implantable Devices No devices added Hospitalization / Surgery History Type of Hospitalization/Surgery copd seizure CABG 4 vessels right AKA Family and Social History Cancer: Yes -  Mother; Diabetes: No; Heart Disease: No; Hereditary Spherocytosis: No; Hypertension: Yes - Mother,Siblings; Kidney Disease: No; Lung Disease: No; Seizures: No; Stroke: Yes - Siblings; Thyroid Problems: No; Tuberculosis: No; Former smoker - quit 1984 - ended on 01/09/1982; Marital Status - Married; Alcohol Use: Moderate; Drug Use: No History; Caffeine Use: Daily; Financial Concerns: No; Food, Clothing or Shelter Needs: No; Support System Lacking: No; Transportation Concerns: No Electronic Signature(s) Signed: 05/04/2020 5:43:46 PM By: Travis Shan DO Signed: 05/04/2020 6:28:10 PM By: Baruch Gouty RN, BSN Entered By: [name] on 05/04/2020 17:28:16 -------------------------------------------------------------------------------- [place] Details Patient Name: Date of  Service: DO RN, Travis V ID L. 05/04/2020 Medical Record Number: [id number] Patient Account Number: [id number] Date of Birth/Sex: Treating RN: 12/28/1929 (87 y.o. 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

## 2020-05-04 NOTE — Progress Notes (Signed)
[name], [name] ([id number]) Visit Report for 05/04/2020 Arrival Information Details Patient Name: Date of Service: DO RN, [place] V ID L. 05/04/2020 2:30 PM Medical Record Number: [id number] Patient Account Number: [id number] Date of Birth/Sex: Treating RN: 23-Aug-1929 (87 y.o. [name], [name] Primary Care [name]: [name] Other Clinician: Referring [name]: Treating [name]: [name] in Treatment: 25 Visit Information History Since Last Visit Added or deleted any medications: No Patient Arrived: Wheel Chair Any new allergies or adverse reactions: No Arrival Time: 15:00 Had a fall or experienced change in No Accompanied By: wife activities of daily living that may affect Transfer Assistance: None risk of falls: Patient Identification Verified: Yes Signs or symptoms of abuse/neglect since last visito No Secondary Verification Process Completed: Yes Hospitalized since last visit: No Patient Requires Transmission-Based Precautions: No Implantable device outside of the clinic excluding No Patient Has Alerts: Yes cellular tissue based products placed in the center Patient Alerts: L ABI N/C, TBI = .20 since last visit: Has Dressing in Place as Prescribed: Yes Has Compression in Place as Prescribed: Yes Pain Present Now: No Electronic Signature(s) Signed: 05/04/2020 6:08:37 PM By: [name] Entered By: [name] on 05/04/2020 15:15:56 -------------------------------------------------------------------------------- Clinic Level of Care Assessment Details Patient Name: Date of Service: DO RN, [name] 05/04/2020 2:30 PM Medical Record Number: [id number] Patient Account Number: [id number] Date of Birth/Sex: Treating RN: 1929-04-05 (87 y.o. [name] Primary Care [name]: [name] Other Clinician: Referring [name]: Treating [name]: [name] in Treatment: 25 Clinic Level of Care  Assessment Items TOOL 4 Quantity Score []  - 0 Use when only an EandM is performed on FOLLOW-UP visit ASSESSMENTS - Nursing Assessment / Reassessment X- 1 10 Reassessment of Co-morbidities (includes updates in patient status) X- 1 5 Reassessment of Adherence to Treatment Plan ASSESSMENTS - Wound and Skin A ssessment / Reassessment []  - 0 Simple Wound Assessment / Reassessment - one wound X- 2 5 Complex Wound Assessment / Reassessment - multiple wounds []  - 0 Dermatologic / Skin Assessment (not related to wound area) ASSESSMENTS - Focused Assessment X- 1 5 Circumferential Edema Measurements - multi extremities []  - 0 Nutritional Assessment / Counseling / Intervention X- 1 5 Lower Extremity Assessment (monofilament, tuning fork, pulses) []  - 0 Peripheral Arterial Disease Assessment (using hand held doppler) ASSESSMENTS - Ostomy and/or Continence Assessment and Care []  - 0 Incontinence Assessment and Management []  - 0 Ostomy Care Assessment and Management (repouching, etc.) PROCESS - Coordination of Care X - Simple Patient / Family Education for ongoing care 1 15 []  - 0 Complex (extensive) Patient / Family Education for ongoing care X- 1 10 Staff obtains Programmer, systems, Records, T Results / Process Orders est X- 1 10 Staff telephones HHA, Nursing Homes / Clarify orders / etc []  - 0 Routine Transfer to another Facility (non-emergent condition) []  - 0 Routine Hospital Admission (non-emergent condition) []  - 0 New Admissions / Biomedical engineer / Ordering NPWT Apligraf, etc. , []  - 0 Emergency Hospital Admission (emergent condition) X- 1 10 Simple Discharge Coordination []  - 0 Complex (extensive) Discharge Coordination PROCESS - Special Needs []  - 0 Pediatric / Minor Patient Management []  - 0 Isolation Patient Management []  - 0 Hearing / Language / Visual special needs []  - 0 Assessment of Community assistance (transportation, D/C planning, etc.) []  -  0 Additional assistance / Altered mentation []  - 0 Support Surface(s) Assessment (bed, cushion, seat, etc.) INTERVENTIONS - Wound Cleansing / Measurement []  -  0 Simple Wound Cleansing - one wound X- 2 5 Complex Wound Cleansing - multiple wounds X- 1 5 Wound Imaging (photographs - any number of wounds) []  - 0 Wound Tracing (instead of photographs) []  - 0 Simple Wound Measurement - one wound X- 2 5 Complex Wound Measurement - multiple wounds INTERVENTIONS - Wound Dressings X - Small Wound Dressing one or multiple wounds 2 10 []  - 0 Medium Wound Dressing one or multiple wounds []  - 0 Large Wound Dressing one or multiple wounds X- 1 5 Application of Medications - topical []  - 0 Application of Medications - injection INTERVENTIONS - Miscellaneous []  - 0 External ear exam []  - 0 Specimen Collection (cultures, biopsies, blood, body fluids, etc.) []  - 0 Specimen(s) / Culture(s) sent or taken to Lab for analysis []  - 0 Patient Transfer (multiple staff / Civil Service fast streamer / Similar devices) []  - 0 Simple Staple / Suture removal (25 or less) []  - 0 Complex Staple / Suture removal (26 or more) []  - 0 Hypo / Hyperglycemic Management (close monitor of Blood Glucose) []  - 0 Ankle / Brachial Index (ABI) - do not check if billed separately X- 1 5 Vital Signs Has the patient been seen at the hospital within the last three years: Yes Total Score: 135 Level Of Care: New/Established - Level 4 Electronic Signature(s) Signed: 05/04/2020 6:28:10 PM By: Travis Gouty RN, BSN Entered By: [name] on 05/04/2020 17:19:57 -------------------------------------------------------------------------------- Encounter Discharge Information Details Patient Name: Date of Service: DO RN, [name] 05/04/2020 2:30 PM Medical Record Number: [id number] Patient Account Number: [id number] Date of Birth/Sex: Treating RN: [birth year] (87 y.o. [name] Primary Care Jovanni Eckhart: [name] Other  Clinician: Referring [name]: Treating [name]: [name] in Treatment: 25 Encounter Discharge Information Items Discharge Condition: Stable Ambulatory Status: Wheelchair Discharge Destination: Home Transportation: Private Auto Accompanied By: wife Schedule Follow-up Appointment: Yes Clinical Summary of Care: Provided on 05/04/2020 Form Type Recipient Paper Patient Patient Electronic Signature(s) Signed: 05/04/2020 5:22:26 PM By: [name] Entered By: [name] on 05/04/2020 17:22:26 -------------------------------------------------------------------------------- Lower Extremity Assessment Details Patient Name: Date of Service: DO RN, [name] 05/04/2020 2:30 PM Medical Record Number: [id number] Patient Account Number: [id number] Date of Birth/Sex: Treating RN: Aug 26, 1929 (87 y.o. [name] Primary Care Jasmine Mcbeth: [name] Other Clinician: Referring Nyoka Alcoser: Treating Suzan Manon/Extender: [name] in Treatment: 25 Edema Assessment Assessed: [Left: Yes] [Right: No] Edema: [Left: Ye] [Right: s] Calf Left: Right: Point of Measurement: From Medial Instep 25 cm Ankle Left: Right: Point of Measurement: From Medial Instep 21 cm Electronic Signature(s) Signed: 05/04/2020 6:08:37 PM By: [name] Entered By: [name] on 05/04/2020 15:16:43 -------------------------------------------------------------------------------- Multi Wound Chart Details Patient Name: Date of Service: DO RN, [name] 05/04/2020 2:30 PM Medical Record Number: [id number] Patient Account Number: [id number] Date of Birth/Sex: Treating RN: Mar 12, 1929 (87 y.o. [name] Primary Care Giovani Neumeister: [name] Other Clinician: Referring Andreya Lacks: Treating Marwah Disbro/Extender: [name] in Treatment: 25 Vital Signs Height(in): 94 Pulse(bpm): 52 Weight(lbs): Blood Pressure(mmHg):  106/67 Body Mass Index(BMI): Temperature(F): 98.7 Respiratory Rate(breaths/min): 18 Photos: Sacrum Left, Medial Lower Leg Nose Wound Location: Pressure Injury Trauma Pressure Injury Wounding Event: Pressure Ulcer Trauma, Other Pressure Ulcer Primary Etiology: Cataracts, Asthma, Chronic Cataracts, Asthma, Chronic Cataracts, Asthma, Chronic Comorbid History: Obstructive Pulmonary Disease Obstructive Pulmonary Disease Obstructive Pulmonary Disease (COPD), Congestive Heart Failure, (COPD), Congestive Heart Failure, (COPD), Congestive Heart Failure, Coronary Artery Disease, Coronary Artery  Disease, Coronary Artery Disease, Hypertension, Peripheral Arterial Hypertension, Peripheral Arterial Hypertension, Peripheral Arterial Disease, Osteoarthritis, Confinement Disease, Osteoarthritis, Confinement Disease, Osteoarthritis, Confinement Anxiety Anxiety Anxiety 08/01/2019 04/08/2020 04/20/2020 Date Acquired: 25 2 0 Weeks of Treatment: Open Open Open Wound Status: 0.5x0.2x0.3 2.3x1x0.1 0.6x0.3x0.1 Measurements L x W x D (cm) 0.079 1.806 0.141 A (cm) : rea 0.024 0.181 0.014 Volume (cm) : 64.10% 34.30% 0.00% % Reduction in A rea: -9.10% 34.20% 0.00% % Reduction in Volume: 12 Position 1 (o'clock): 0.3 Maximum Distance 1 (cm): Yes No No Tunneling: Category/Stage III Full Thickness Without Exposed Category/Stage II Classification: Support Structures Medium Medium Small Exudate Amount: Serosanguineous Serosanguineous Serosanguineous Exudate Type: red, brown red, brown red, brown Exudate Color: Epibole Flat and Intact Distinct, outline attached Wound Margin: Large (67-100%) None Present (0%) Large (67-100%) Granulation Amount: Pink, Pale N/A Red Granulation Quality: None Present (0%) Large (67-100%) None Present (0%) Necrotic Amount: Fat Layer (Subcutaneous Tissue): Yes Fat Layer (Subcutaneous Tissue): Yes Fat Layer (Subcutaneous Tissue): Yes Exposed Structures: Fascia:  No Fascia: No Fascia: No Tendon: No Tendon: No Tendon: No Muscle: No Muscle: No Muscle: No Joint: No Joint: No Joint: No Bone: No Bone: No Bone: No Small (1-33%) Small (1-33%) Small (1-33%) Epithelialization: N/A 8 sutures noted. N/A Assessment Notes: Treatment Notes Wound #15 (Sacrum) Cleanser Wound Cleanser Discharge Instruction: Cleanse the wound with wound cleanser prior to applying a clean dressing using gauze sponges, not tissue or cotton balls. Peri-Wound Care Skin Prep Discharge Instruction: Use skin prep as directed Topical Primary Dressing Promogran Prisma Matrix, 4.34 (sq in) (silver collagen) Discharge Instruction: Moisten collagen with saline or hydrogel Secondary Dressing ComfortFoam Border, 3x3 in (silicone border) Discharge Instruction: Apply over primary dressing as directed. Secured With Compression Wrap Compression Stockings Add-Ons Wound #18 (Lower Leg) Wound Laterality: Left, Medial Cleanser Soap and Water Discharge Instruction: May shower and wash wound with dial antibacterial soap and water prior to dressing change. Wound Cleanser Discharge Instruction: Cleanse the wound with wound cleanser prior to applying a clean dressing using gauze sponges, not tissue or cotton balls. Wound Cleanser Discharge Instruction: Cleanse the wound with wound cleanser prior to applying a clean dressing using gauze sponges, not tissue or cotton balls. Peri-Wound Care Triamcinolone 15 (g) Discharge Instruction: Use triamcinolone 15 (g) as directed Sween Lotion (Moisturizing lotion) Discharge Instruction: Apply moisturizing lotion as directed Topical Primary Dressing Promogran Prisma Matrix, 4.34 (sq in) (silver collagen) Discharge Instruction: Moisten collagen with saline or hydrogel and place under flap Secondary Dressing Woven Gauze Sponge, Non-Sterile 4x4 in Discharge Instruction: Apply over primary dressing as directed. Secured With Compression  Wrap Kerlix Roll 4.5x3.1 (in/yd) Discharge Instruction: erlix/coban wrap should be done very lightl Coban Self-Adherent Wrap 4x5 (in/yd) Discharge Instruction: kerlix/coban wrap should be done very lightl Compression Stockings Add-Ons Wound #19 (Nose) Cleanser Peri-Wound Care Topical Triple Antibiotic Ointment, 1 (oz) Tube Primary Dressing Secondary Dressing bandaid Secured With Compression Wrap Compression Stockings Add-Ons Electronic Signature(s) Signed: 05/04/2020 5:43:46 PM By: Kalman Shan DO Signed: 05/04/2020 6:28:10 PM By: Travis Gouty RN, BSN Entered By: Kalman Shan on 05/04/2020 17:24:54 -------------------------------------------------------------------------------- Multi-Disciplinary Care Plan Details Patient Name: Date of Service: DO RN, [name] 05/04/2020 2:30 PM Medical Record Number: [id number] Patient Account Number: [id number] Date of Birth/Sex: Treating RN: 1929/06/11 (87 y.o. [name] Primary Care Jenita Rayfield: [name] Other Clinician: Referring Mileidy Atkin: Treating Steed Kanaan/Extender: [name] in Treatment: 25 Active Inactive Wound/Skin Impairment Nursing Diagnoses: Knowledge deficit related to ulceration/compromised skin integrity Goals: Patient/caregiver will verbalize  understanding of skin care regimen Date Initiated: 11/11/2019 Target Resolution Date: 06/04/2020 Goal Status: Active Ulcer/skin breakdown will have a volume reduction of 30% by week 4 Date Initiated: 11/11/2019 Target Resolution Date: 06/04/2020 Goal Status: Active Interventions: Assess patient/caregiver ability to obtain necessary supplies Assess patient/caregiver ability to perform ulcer/skin care regimen upon admission and as needed Assess ulceration(s) every visit Notes: Electronic Signature(s) Signed: 05/04/2020 6:28:10 PM By: Travis Gouty RN, BSN Entered By: [name] on 05/04/2020  16:09:21 -------------------------------------------------------------------------------- Pain Assessment Details Patient Name: Date of Service: DO RN, [name] 05/04/2020 2:30 PM Medical Record Number: [id number] Patient Account Number: [id number] Date of Birth/Sex: Treating RN: 25-Dec-1929 (87 y.o. [name] Primary Care Joceline Hinchcliff: [name] Other Clinician: Referring Mayo Owczarzak: Treating Lareta Bruneau/Extender: [name] in Treatment: 25 Active Problems Location of Pain Severity and Description of Pain Patient Has Paino No Site Locations Rate the pain. Current Pain Level: 0 Pain Management and Medication Current Pain Management: Medication: No Cold Application: No Rest: No Massage: No Activity: No T.E.N.S.: No Heat Application: No Leg drop or elevation: No Is the Current Pain Management Adequate: Adequate How does your wound impact your activities of daily livingo Sleep: No Bathing: No Appetite: No Relationship With Others: No Bladder Continence: No Emotions: No Bowel Continence: No Work: No Toileting: No Drive: No Dressing: No Hobbies: No Electronic Signature(s) Signed: 05/04/2020 6:08:37 PM By: [name] Entered By: [name] on 05/04/2020 15:16:31 -------------------------------------------------------------------------------- Patient/Caregiver Education Details Patient Name: Date of Service: DO RN, DA Threasa Alpha 4/26/2022andnbsp2:30 PM Medical Record Number: [id number] Patient Account Number: [id number] Date of Birth/Gender: Treating RN: 05-18-1929 (87 y.o. [name] Primary Care Physician: [name] Other Clinician: Referring Physician: Treating Physician/Extender: [name] in Treatment: 25 Education Assessment Education Provided To: Patient Education Topics Provided Pressure: Methods: Explain/Verbal Responses: Reinforcements needed, State content correctly Wound/Skin  Impairment: Methods: Explain/Verbal Responses: Reinforcements needed, State content correctly Electronic Signature(s) Signed: 05/04/2020 6:28:10 PM By: Travis Gouty RN, BSN Entered By: [name] on 05/04/2020 16:09:46 -------------------------------------------------------------------------------- Wound Assessment Details Patient Name: Date of Service: DO RN, [name] 05/04/2020 2:30 PM Medical Record Number: [id number] Patient Account Number: [id number] Date of Birth/Sex: Treating RN: 23-Sep-1929 (87 y.o. [name] Primary Care Praneel Haisley: [name] Other Clinician: Referring Jannifer Fischler: Treating Melenie Minniear/Extender: [name] in Treatment: 25 Wound Status Wound Number: 15 Primary Pressure Ulcer Etiology: Wound Location: Sacrum Wound Open Wounding Event: Pressure Injury Status: Date Acquired: 08/01/2019 Comorbid Cataracts, Asthma, Chronic Obstructive Pulmonary Disease Weeks Of Treatment: 25 History: (COPD), Congestive Heart Failure, Coronary Artery Disease, Clustered Wound: No Hypertension, Peripheral Arterial Disease, Osteoarthritis, Confinement Anxiety Photos Wound Measurements Length: (cm) 0.5 Width: (cm) 0.2 Depth: (cm) 0.3 Area: (cm) 0.079 Volume: (cm) 0.024 % Reduction in Area: 64.1% % Reduction in Volume: -9.1% Epithelialization: Small (1-33%) Tunneling: Yes Position (o'clock): 12 Maximum Distance: (cm) 0.3 Undermining: No Wound Description Classification: Category/Stage III Wound Margin: Epibole Exudate Amount: Medium Exudate Type: Serosanguineous Exudate Color: red, brown Foul Odor After Cleansing: No Slough/Fibrino No Wound Bed Granulation Amount: Large (67-100%) Exposed Structure Granulation Quality: Pink, Pale Fascia Exposed: No Necrotic Amount: None Present (0%) Fat Layer (Subcutaneous Tissue) Exposed: Yes Tendon Exposed: No Muscle Exposed: No Joint Exposed: No Bone Exposed: No Treatment  Notes Wound #15 (Sacrum) Cleanser Wound Cleanser Discharge Instruction: Cleanse the wound with wound cleanser prior to applying a clean dressing using gauze sponges, not tissue or cotton balls. Peri-Wound Care Skin Prep Discharge  Instruction: Use skin prep as directed Topical Primary Dressing Promogran Prisma Matrix, 4.34 (sq in) (silver collagen) Discharge Instruction: Moisten collagen with saline or hydrogel Secondary Dressing ComfortFoam Border, 3x3 in (silicone border) Discharge Instruction: Apply over primary dressing as directed. Secured With Compression Wrap Compression Stockings Environmental education officer) Signed: 05/04/2020 5:14:41 PM By: [name] Signed: 05/04/2020 6:08:37 PM By: [name] Entered By: [name] on 05/04/2020 16:05:24 -------------------------------------------------------------------------------- Wound Assessment Details Patient Name: Date of Service: DO RN, [name] 05/04/2020 2:30 PM Medical Record Number: [id number] Patient Account Number: [id number] Date of Birth/Sex: Treating RN: 03/04/1929 (87 y.o. [name] Primary Care Eliceo Gladu: [name] Other Clinician: Referring Micky Sheller: Treating Phylicia Mcgaugh/Extender: [name] in Treatment: 25 Wound Status Wound Number: 18 Primary Trauma, Other Etiology: Wound Location: Left, Medial Lower Leg Wound Open Wounding Event: Trauma Status: Date Acquired: 04/08/2020 Comorbid Cataracts, Asthma, Chronic Obstructive Pulmonary Disease Weeks Of Treatment: 2 History: (COPD), Congestive Heart Failure, Coronary Artery Disease, Clustered Wound: No Hypertension, Peripheral Arterial Disease, Osteoarthritis, Confinement Anxiety Photos Wound Measurements Length: (cm) 2.3 Width: (cm) 1 Depth: (cm) 0.1 Area: (cm) 1.806 Volume: (cm) 0.181 % Reduction in Area: 34.3% % Reduction in Volume: 34.2% Epithelialization: Small (1-33%) Tunneling:  No Undermining: No Wound Description Classification: Full Thickness Without Exposed Support Structures Wound Margin: Flat and Intact Exudate Amount: Medium Exudate Type: Serosanguineous Exudate Color: red, brown Foul Odor After Cleansing: No Slough/Fibrino Yes Wound Bed Granulation Amount: None Present (0%) Exposed Structure Necrotic Amount: Large (67-100%) Fascia Exposed: No Necrotic Quality: Adherent Slough Fat Layer (Subcutaneous Tissue) Exposed: Yes Tendon Exposed: No Muscle Exposed: No Joint Exposed: No Bone Exposed: No Assessment Notes 8 sutures noted. Treatment Notes Wound #18 (Lower Leg) Wound Laterality: Left, Medial Cleanser Soap and Water Discharge Instruction: May shower and wash wound with dial antibacterial soap and water prior to dressing change. Wound Cleanser Discharge Instruction: Cleanse the wound with wound cleanser prior to applying a clean dressing using gauze sponges, not tissue or cotton balls. Wound Cleanser Discharge Instruction: Cleanse the wound with wound cleanser prior to applying a clean dressing using gauze sponges, not tissue or cotton balls. Peri-Wound Care Triamcinolone 15 (g) Discharge Instruction: Use triamcinolone 15 (g) as directed Sween Lotion (Moisturizing lotion) Discharge Instruction: Apply moisturizing lotion as directed Topical Primary Dressing Promogran Prisma Matrix, 4.34 (sq in) (silver collagen) Discharge Instruction: Moisten collagen with saline or hydrogel and place under flap Secondary Dressing Woven Gauze Sponge, Non-Sterile 4x4 in Discharge Instruction: Apply over primary dressing as directed. Secured With Compression Wrap Kerlix Roll 4.5x3.1 (in/yd) Discharge Instruction: erlix/coban wrap should be done very lightl Coban Self-Adherent Wrap 4x5 (in/yd) Discharge Instruction: kerlix/coban wrap should be done very lightl Compression Stockings Add-Ons Electronic Signature(s) Signed: 05/04/2020 5:14:41 PM By:  [name] Signed: 05/04/2020 6:08:37 PM By: [name] Entered By: [name] on 05/04/2020 16:05:04 -------------------------------------------------------------------------------- Wound Assessment Details Patient Name: Date of Service: DO RN, [name] 05/04/2020 2:30 PM Medical Record Number: [id number] Patient Account Number: [id number] Date of Birth/Sex: Treating RN: [birth year] (87 y.o. [name] Primary Care Tinzlee Craker: [name] Other Clinician: Referring Emaley Applin: Treating Alethea Terhaar/Extender: [name] in Treatment: 25 Wound Status Wound Number: 19 Primary Pressure Ulcer Etiology: Wound Location: Nose Wound Open Wounding Event: Pressure Injury Status: Date Acquired: 04/20/2020 Comorbid Cataracts, Asthma, Chronic Obstructive Pulmonary Disease Weeks Of Treatment: 0 History: (COPD), Congestive Heart Failure, Coronary Artery Disease, Clustered Wound: No Hypertension, Peripheral Arterial Disease, Osteoarthritis, Confinement Anxiety Photos Wound Measurements  Length: (cm) 0.6 Width: (cm) 0.3 Depth: (cm) 0.1 Area: (cm) 0.141 Volume: (cm) 0.014 % Reduction in Area: 0% % Reduction in Volume: 0% Epithelialization: Small (1-33%) Tunneling: No Undermining: No Wound Description Classification: Category/Stage II Wound Margin: Distinct, outline attached Exudate Amount: Small Exudate Type: Serosanguineous Exudate Color: red, brown Foul Odor After Cleansing: No Slough/Fibrino No Wound Bed Granulation Amount: Large (67-100%) Exposed Structure Granulation Quality: Red Fascia Exposed: No Necrotic Amount: None Present (0%) Fat Layer (Subcutaneous Tissue) Exposed: Yes Tendon Exposed: No Muscle Exposed: No Joint Exposed: No Bone Exposed: No Treatment Notes Wound #19 (Nose) Cleanser Peri-Wound Care Topical Triple Antibiotic Ointment, 1 (oz) Tube Primary Dressing Secondary Dressing bandaid Secured With Compression  Wrap Compression Stockings Add-Ons Electronic Signature(s) Signed: 05/04/2020 5:14:41 PM By: [name] Signed: 05/04/2020 6:08:37 PM By: [name] Entered By: [name] on 05/04/2020 16:04:42 -------------------------------------------------------------------------------- Vitals Details Patient Name: Date of Service: DO RN, [name] 05/04/2020 2:30 PM Medical Record Number: [id number] Patient Account Number: [id number] Date of Birth/Sex: Treating RN: 09-19-1929 (87 y.o. [name] Primary Care Asbury Hair: [name] Other Clinician: Referring Rennee Coyne: Treating Marvie Calender/Extender: [name] in Treatment: 25 Vital Signs Time Taken: 15:00 Temperature (F): 98.7 Height (in): 69 Pulse (bpm): 98 Respiratory Rate (breaths/min): 18 Blood Pressure (mmHg): 106/67 Reference Range: 80 - 120 mg / dl Electronic Signature(s) Signed: 05/04/2020 6:08:37 PM By: [name] Entered By: [name] on 05/04/2020 15:16:22

## 2020-05-05 ENCOUNTER — Other Ambulatory Visit: Payer: Self-pay

## 2020-05-05 ENCOUNTER — Ambulatory Visit (INDEPENDENT_AMBULATORY_CARE_PROVIDER_SITE_OTHER): Payer: Medicare Other | Admitting: Physician Assistant

## 2020-05-05 ENCOUNTER — Encounter: Payer: Self-pay | Admitting: Physician Assistant

## 2020-05-05 ENCOUNTER — Other Ambulatory Visit: Payer: Self-pay | Admitting: Internal Medicine

## 2020-05-05 DIAGNOSIS — Z89611 Acquired absence of right leg above knee: Secondary | ICD-10-CM | POA: Diagnosis not present

## 2020-05-05 NOTE — Progress Notes (Signed)
POST OPERATIVE OFFICE NOTE    CC:  F/u for surgery  HPI:  This is a 87 y.o. male who is s/p revision of BKA to AKA on the right LE.  He was worried about feeling lumps in the distal stump.  He denise open wounds, erythema or edema.   His wife states he fell recently and has a skin tear lesion on his anterior shin left LE.  He has known PAD on the left LE as well.  The wish to cont. Follow up with the wound care center for the skin tear.    Allergies  Allergen Reactions  . Cefdinir Diarrhea and Other (See Comments)    Severe Diarrhea  . Nitrofurantoin Swelling and Other (See Comments)    Hand Swelling  . Sulfa Antibiotics Anaphylaxis and Swelling  . Sulfonamide Derivatives Swelling and Other (See Comments)    Facial/tongue swelling  . Tape Other (See Comments)    SKIN IS VERY THIN AND TEARS EASILY!!!!! Please do NOT use "plastic" tape- USE PAPER!!  . Ciprofloxacin Itching, Rash and Other (See Comments)    Red itchy hands  . Nitrofurantoin Swelling and Other (See Comments)    Swollen hands  . Amoxicillin Er Other (See Comments)    Frequest urination. Pt reports "not really" allergic 01/24/20  . Doxycycline Other (See Comments)    "Felt terrible" Pt wife reports "not really" allergic 01/24/20  . Keflex [Cephalexin] Other (See Comments)    Pt does not recall reaction   . Mirtazapine Other (See Comments)  . Other Other (See Comments)  . Ranolazine Other (See Comments)    Inability to ambulate   . Chlorhexidine Gluconate Rash  . Levaquin [Levofloxacin] Itching and Rash  . Sertraline Anxiety and Other (See Comments)    Makes the patient jittery    Current Outpatient Medications  Medication Sig Dispense Refill  . acetaminophen (TYLENOL) 500 MG tablet Take 500 mg by mouth every 6 (six) hours as needed for moderate pain.    Marland Kitchen alfuzosin (UROXATRAL) 10 MG 24 hr tablet Take 1 tablet (10 mg total) by mouth daily with breakfast. 30 tablet 11  . ALPRAZolam (XANAX) 0.5 MG tablet Take 0.5  mg by mouth 3 (three) times daily as needed for anxiety.    Marland Kitchen aspirin 81 MG tablet Take 81 mg by mouth daily.    Marland Kitchen atorvastatin (LIPITOR) 20 MG tablet Take 20 mg by mouth at bedtime.    . calcium carbonate (TUMS - DOSED IN MG ELEMENTAL CALCIUM) 500 MG chewable tablet Chew 1 tablet (200 mg of elemental calcium total) by mouth 2 (two) times daily as needed for indigestion or heartburn.    . Cholecalciferol (VITAMIN D3) 50 MCG (2000 UT) TABS Take 2,000 Units by mouth daily with lunch. 30 tablet 0  . clotrimazole-betamethasone (LOTRISONE) cream Apply 1 application topically daily. Apply to affected area.    . famotidine (PEPCID) 20 MG tablet Take 20 mg by mouth at bedtime as needed for heartburn or indigestion.    . finasteride (PROSCAR) 5 MG tablet Take 1 tablet (5 mg total) by mouth daily with lunch. 30 tablet 0  . fluticasone (FLONASE) 50 MCG/ACT nasal spray Place 2 sprays into both nostrils daily.    . furosemide (LASIX) 40 MG tablet Take 1 tablet (40 mg total) by mouth daily. May take up to 80 mg as needed. 30 tablet   . guaiFENesin (MUCINEX) 600 MG 12 hr tablet Take 1 tablet (600 mg total) by mouth 2 (two)  times daily. (Patient taking differently: Take 600 mg by mouth as needed for cough.) 60 tablet 0  . HYDROcodone-acetaminophen (NORCO/VICODIN) 5-325 MG tablet Take 1 tablet by mouth every 4 (four) hours as needed for moderate pain. 30 tablet 0  . levalbuterol (XOPENEX HFA) 45 MCG/ACT inhaler Inhale 2 puffs into the lungs every 4 (four) hours as needed for wheezing or shortness of breath. 15 g 5  . levalbuterol (XOPENEX) 0.63 MG/3ML nebulizer solution Take 3 mLs (0.63 mg total) by nebulization every 4 (four) hours as needed for wheezing or shortness of breath. 120 mL 3  . Multiple Vitamins-Minerals (PRESERVISION AREDS 2) CAPS Take 1 capsule by mouth daily.    Marland Kitchen NITROSTAT 0.4 MG SL tablet Place 0.4 mg under the tongue every 5 (five) minutes as needed for chest pain.    . OXYGEN Inhale 2 L/min into  the lungs continuous.     . pantoprazole (PROTONIX) 40 MG tablet TAKE 1 TABLET(40 MG) BY MOUTH DAILY 30 TO 60 MINUTES BEFORE FIRST MEAL OF THE DAY (Patient taking differently: Take 40 mg by mouth daily before breakfast.) 30 tablet 3  . Polyethyl Glycol-Propyl Glycol (SYSTANE) 0.4-0.3 % SOLN Apply 1 drop to eye 2 (two) times daily as needed (eye irritation). (Patient taking differently: Apply 1 drop to eye 3 (three) times daily as needed (eye irritation).)  0  . polyethylene glycol (MIRALAX / GLYCOLAX) 17 g packet Take 17 g by mouth daily. (Patient taking differently: Take 17 g by mouth daily as needed for moderate constipation.) 14 each 0  . predniSONE (DELTASONE) 10 MG tablet Take 1 tablet (10 mg total) by mouth daily. 30 tablet 0  . sertraline (ZOLOFT) 25 MG tablet Take 25 mg by mouth daily.    . sodium chloride (MURO 128) 5 % ophthalmic solution Place 1 drop into both eyes 4 (four) times daily. 15 mL   . SYMBICORT 160-4.5 MCG/ACT inhaler Inhale 2 puffs into the lungs 2 (two) times daily. 1 each 5  . Tiotropium Bromide Monohydrate (SPIRIVA RESPIMAT) 2.5 MCG/ACT AERS INHALE 2 PUFFS BY MOUTH EVERY DAY (Patient taking differently: No sig reported) 4 g 11  . vitamin B-12 (CYANOCOBALAMIN) 1000 MCG tablet Take 1 tablet (1,000 mcg total) by mouth daily. 30 tablet 0   No current facility-administered medications for this visit.     ROS:  See HPI  Physical Exam:    Incision:  Right AKA stump without erythema, edema, or open wounds. Extremities:  The right AKA stump is warm to touch, no palpable defects in the tissue.  The left LE is wrapped with light compression to help with chronic edema and the skin tear for protection.   Lungs O2 24 hours a day, non labored breathing   Assessment/Plan:  This is a 87 y.o. male who is s/p: right BKA to AKA The left stump appears viable and well healed without ischemic changes. The left LE is currently being managed by wound care.  He has a post op shoe and I  gave them a prescription for a custom foot wear to proteic his left foot.     He and his wife are not interested in vascular intervention or amputation in regards to the left LE.  He will f/u PRN.         Roxy Horseman PA-C [facility] [phone]   Clinic MD:  Scot Dock

## 2020-05-06 ENCOUNTER — Telehealth: Payer: Self-pay | Admitting: Internal Medicine

## 2020-05-06 MED ORDER — PREDNISONE 10 MG PO TABS: 10.0000 mg | ORAL_TABLET | Freq: Every day | ORAL | 5 refills | Status: AC

## 2020-05-06 MED ORDER — FLUTICASONE PROPIONATE 50 MCG/ACT NA SUSP: 2.0000 | Freq: Every day | NASAL | 5 refills | Status: DC

## 2020-05-06 NOTE — Telephone Encounter (Signed)
[name] returning a phone call. [name] can be reached at [phone]

## 2020-05-06 NOTE — Telephone Encounter (Signed)
Ok to refill x 5 both

## 2020-05-06 NOTE — Telephone Encounter (Signed)
ATC Lycan Davee (wife), listed on the DPR, left a VM to return call.

## 2020-05-06 NOTE — Telephone Encounter (Signed)
I have called the pts wife and she is aware of RX that have been sent to the pharmacy per pts request.

## 2020-05-06 NOTE — Telephone Encounter (Signed)
Pt is requesting a refill of the prednisone and the flonase.  This was not given by you the last time.  The prednisone was last filled on 02/16/2020 for #30 with no refills and this was written by Lauraine Rinne, PA-C.  Please advise of refills on these meds. Thanks   Last OV was 11/06/2019 with MW No pending appts with you.

## 2020-05-07 ENCOUNTER — Telehealth: Payer: Self-pay | Admitting: Internal Medicine

## 2020-05-07 MED ORDER — MOMETASONE FUROATE 50 MCG/ACT NA SUSP: NASAL | 2 refills | Status: DC

## 2020-05-07 NOTE — Telephone Encounter (Signed)
Ok to use nasonex 1-2 each nostril bid but pre treat with afrin 2 min prior x 5 days then stop afrin for at least 5 days before reusing

## 2020-05-07 NOTE — Telephone Encounter (Signed)
Patient's wife, [name], listed on DPR, called back.  Spoke with her, patient could be heard in the background as well.  Provided recommendations per Dr. [name], she verbalized understanding.  Script for nasonex sent to [facility].  Nothing further needed.

## 2020-05-07 NOTE — Telephone Encounter (Signed)
Lm x1 patient.  

## 2020-05-07 NOTE — Telephone Encounter (Signed)
Dr [name]- pt is requesting rx for nasonex. He feels that the flonase is no longer effective. Is that okay? Thanks!

## 2020-05-10 ENCOUNTER — Encounter (HOSPITAL_BASED_OUTPATIENT_CLINIC_OR_DEPARTMENT_OTHER): Payer: Medicare Other | Attending: Internal Medicine | Admitting: Internal Medicine

## 2020-05-10 ENCOUNTER — Other Ambulatory Visit: Payer: Self-pay

## 2020-05-10 DIAGNOSIS — S81802D Unspecified open wound, left lower leg, subsequent encounter: Secondary | ICD-10-CM | POA: Diagnosis not present

## 2020-05-10 DIAGNOSIS — I739 Peripheral vascular disease, unspecified: Secondary | ICD-10-CM | POA: Insufficient documentation

## 2020-05-10 DIAGNOSIS — X58XXXD Exposure to other specified factors, subsequent encounter: Secondary | ICD-10-CM | POA: Diagnosis not present

## 2020-05-10 DIAGNOSIS — L97822 Non-pressure chronic ulcer of other part of left lower leg with fat layer exposed: Secondary | ICD-10-CM | POA: Diagnosis not present

## 2020-05-10 DIAGNOSIS — L89153 Pressure ulcer of sacral region, stage 3: Secondary | ICD-10-CM | POA: Diagnosis not present

## 2020-05-10 DIAGNOSIS — Z951 Presence of aortocoronary bypass graft: Secondary | ICD-10-CM | POA: Insufficient documentation

## 2020-05-10 DIAGNOSIS — L98491 Non-pressure chronic ulcer of skin of other sites limited to breakdown of skin: Secondary | ICD-10-CM | POA: Diagnosis not present

## 2020-05-10 DIAGNOSIS — Z89611 Acquired absence of right leg above knee: Secondary | ICD-10-CM | POA: Insufficient documentation

## 2020-05-10 DIAGNOSIS — L97829 Non-pressure chronic ulcer of other part of left lower leg with unspecified severity: Secondary | ICD-10-CM | POA: Diagnosis present

## 2020-05-10 NOTE — Progress Notes (Signed)
[name], [name] ([id number]) Visit Report for 05/10/2020 Debridement Details Patient Name: Date of Service: DO RN, [place] V ID L. 05/10/2020 2:45 PM Medical Record Number: [id number] Patient Account Number: [id number] Date of Birth/Sex: Treating RN: 1929-05-05 (87 y.o. [name] Primary Care Provider: [name] Other Clinician: Referring Provider: Treating Provider/Extender: [name] in Treatment: 25 Debridement Performed for Assessment: Wound #18 Left,Medial Lower Leg Performed By: Physician Ricard Dillon., MD Debridement Type: Debridement Level of Consciousness (Pre-procedure): Awake and Alert Pre-procedure Verification/Time Out Yes - 16:18 Taken: Start Time: 16:18 T Area Debrided (L x W): otal 3.6 (cm) x 1.2 (cm) = 4.32 (cm) Tissue and other material debrided: Viable, Non-Viable, Slough, Subcutaneous, Slough Level: Skin/Subcutaneous Tissue Debridement Description: Excisional Instrument: Curette, Forceps, Scissors Bleeding: Minimum Hemostasis Achieved: Pressure End Time: 16:19 Procedural Pain: 0 Post Procedural Pain: 0 Response to Treatment: Procedure was tolerated well Level of Consciousness (Post- Awake and Alert procedure): Post Debridement Measurements of Total Wound Length: (cm) 3.6 Width: (cm) 1.2 Depth: (cm) 0.2 Volume: (cm) 0.679 Character of Wound/Ulcer Post Debridement: Requires Further Debridement Post Procedure Diagnosis Same as Pre-procedure Electronic Signature(s) Signed: 05/10/2020 5:25:01 PM By: [name] MD Signed: 05/10/2020 5:39:21 PM By: Levan Hurst RN, BSN Entered By: [name] on 05/10/2020 16:53:58 -------------------------------------------------------------------------------- HPI Details Patient Name: Date of Service: DO RN, [name] 05/10/2020 2:45 PM Medical Record Number: [id number] Patient Account Number: [id number] Date of Birth/Sex: Treating RN: 1929/04/05 (87 y.o. [name] Primary  Care Provider: [name] Other Clinician: Referring Provider: Treating Provider/Extender: [name] in Treatment: 25 History of Present Illness HPI Description: 01/30/18 on evaluation today patient presents for initial inspection concerning the skin tear of the right forearm. Fortunately there does not appear to be any evidence of infection and this occurred approximately three days ago. He has been using Vaseline over the region. With that being said the patient does have a history of cataracts COPD, hypertension, peripheral arterial disease, and osteoarthritis. He tells me that he does have a little bit of pain at the site but nothing too significant at this time which is good news. Fortunately this overall appears to be doing fairly well which is good news. No fevers, chills, nausea, or vomiting noted at this time. The biggest issue at this point is that the wound is of quite significant size. READMISSION 04/21/2019 This is a 87 year old man with multiple medical problems who was seen once in the clinic here in January 2020 with a skin tear on his right forearm seen by [name]. This problem this time started sometime recently. He was noted by podiatry to have erosions and wounds of his right foot on toes 1-3. He also saw [name] of dermatology who gave him Silvadene cream for what I think was felt to be a stasis dermatitis of his bilateral lower legs. At some point he was referred to [name]. He was also noted to have previous noninvasive arterial studies that showed no flow to either one of his toes and noncompressible ABIs bilaterally. [name] did noninvasive arterial studies on him. These showed that he had greater than 75% stenosis in the proximal and mid common femoral artery. Monophasic flow in the posterior tibial and dorsalis pedis positions on the right foot. ABIs again were noncompressible. He was felt to have total occlusion of the superficial  femoral artery on the right. The overall interpretation is that he had severe multilevel arterial occlusive disease.  Absent femoral pulses and superficial femoral artery occlusions. In spite of this he was not felt to be candidate for arteriography given his bilateral femoral artery occlusions as well he would not be a candidate for endovascular approach secondary to his common femoral artery occlusions and proximal iliac disease. He was not felt to be a candidate for an open infra inguinal bypass. He therefore felt he would need to be monitored over time with the only option being a right below-knee amputation The patient complains currently of pain at night when he is up in bed. The pain in the right foot is better when he puts the leg down over the bed side. This is compatible with rest pain. He has very limited activity i.e. is even exhausted getting dressed in the morning because of cardiopulmonary issues therefore it is difficult to gauge claudication I believe he was referred here by Dr. [name] with one of the major issues is whether he would be a candidate for hyperbaric oxygen. He is not a diabetic. Dr. [name] did give him Silvadene cream for dry flaking skin in his bilateral lower extremities, however the patient is concerned about using this in the face of sulfa allergy. 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 This is on the medial aspect extended there is a second area on the plantar aspect. Most of what the patient talks about is claudication with minimal activity [however the patient is limited by his COPD], or even at rest at night. He is not getting a lot  of sleep. We use silver collagen on this wound 5/4; the patient has a wound on the tip of his right great toe also on the medial aspect. Setting of severe nonrevascularizable PAD [see description in HPI]. He does not describe any change in his pain. We have been using silver collagen 5/18; 2-week follow-up. The patient has wound on the tip of his right great toe as well as the medial aspect. He has developed new wounds x2 on the right lateral foot and the right lateral calcaneus. These look like ischemic areas. The patient talks about claudication at night. He seems to be more comfortable during the day. We have been using silver collagen to the wounds. I have previously debrided the eschar on the toes however this comes right back. 6/1; 2-week follow-up. This patient has ischemic wounds on his right foot in the setting of nonunreconstructable PAD [previously reviewed by Dr. Dixon]. He does not really describe severe pain but he does have episodic pain in the right great toe. He has an area on the tip of the right great toe as well as the medial aspect of the right great toe. A new area on the tip of the right fourth toe. He has the area on the right lateral foot fortunately the area on the right lateral heel appears to have epithelialized over. We are using silver collagen to all wounds 06/23/2019 upon evaluation today patient appears to be doing about the same in regard to his foot ulcers. Unfortunately he has 2 new skin tears on his left elbow and the right forearm that were new as of today. He states that it was doing okay until they  went to change the dressing and they put some collagen on it that got stuck and somewhat pulled back the skin unfortunately. Fortunately there is no signs of active infection at this time. No fevers, chills, nausea, vomiting, or diarrhea. 6/29; the patient was hospitalized from 6/18 through 6/23 going in with increasing drainage from the wounds on his right foot. He  underwent a angiogram on 06/30/2019 by Dr. Donzetta Matters which revealed multilevel arterial disease including occlusion of the right common femoral artery. He reconstitutes the profunda femoris and the below-knee popliteal artery and appears to have posterior tibial peroneal runoff to the ankle although there is heavy calcification of the popliteal below the knee. He was offered consideration of a right above-knee amputation, palliative and/or hospice care and consideration of a very high risk attempt at bypass. He did receive IV antibiotics. He comes in the clinic today again with the 2 areas that are necrotic on the left great toe and a small punched out area on the right lateral foot. A lot of the erythema on top of the right foot but no warmth. He has a lot of swelling here. The patient is literally tortured over this decision that he needs to make. I went over this with him in exceptional detail. I do not believe he is a candidate for any form of open bypass surgery. I think his vascular doctors would agree with that. He still has pain at night when he puts his leg up on the bed that is relieved by putting his leg dependent. This is not different from what I remember. He is not septic there is no additional skin breakdown that I can see 7/8 the patient continues to have severe pain in the right foot at night making it difficult for him to rest. There is increasing erythema in the right foot but the foot is cold. I do not think this is an infection I think this is likely to be tissue necrosis from ischemia. I thought he would have made an up decision today about going forward with hospice he has not. He did not go to see [name] on 7/6. Again he wants to talk to me at length about amputations. I do not think he will do well after an above-knee amputation on the right. Furthermore I do not think he is a candidate for bypass surgery on the right leg 7/20; patient still having a lot of pain in the right foot.  He saw Dr. Donzetta Matters on 7/16. He is not felt to be able to undergo a complex revascularization and I certainly agree with that. He was offered a right below-knee amputation but he has not made that decision. I have repetitively suggested hospice care if he will not undergo an amputation he is not decided he wants to go that route either. He arrives in the clinic today accompanied by his son who is a Stage manager and his wife. I think the patient has tissue breakdown in the dorsal foot secondary to ischemia although his son brought up that some of the erythema could be a contact dermatitis as they were apparently layering a large area of pure call over the wound area. I was not aware of this not supposed to be doing it in this fashion. In any case the erythema look better than last week and I am quite convinced this is not cellulitis READMISSION 11/11/2019 [name] is now an 87 year old man who we had in this clinic from [place] through July/21 largely as  a result of ischemic wounds in his right foot. He was followed by vein and vascular [name]. His anatomy was not felt to be amenable to a revascularization. The patient had increasing ischemic pain but was very resistant to the notion of an amputation. Eventually he did go through with this on August 22 he will underwent an AKA by [name]. He went to rehabilitation. There he developed a small stage III lower sacral pressure ulcer and an area on his left lateral heel which is also probably a pressure ulcer. They have been using silver collagen on both wound areas. The patient states he is not in any unrelieved pain. He is in a wheelchair at home with a Roho cushion. He is able to do his own transfers. I think he is probably putting far too much pressure on these areas Past medical history is reviewed. He has a history of CHF, severe PAD, hypertension, coronary artery disease status post CABG, COPD Gold stage IV on O2, hypothyroidism and a recent  problem with urinary retention he now has a Foley catheter. He has had problems with left leg swelling and adjustment of his diuretics he is followed by Dr. Daneen Schick. The patient has severe PAD. On 06/30/2019 he had an angiogram on the remaining left side he had heavily diseased femoral artery. His superficial femoral artery was flush occluded with runoff in the profunda. He was felt his SFA was likely occluded and reconstitutes the popliteal artery via runoff via the peroneal and posterior tibial artery. As noted he is not in a lot of pain. His last noninvasive study was on 6/20. On the left side this was noncompressible ABI but a TBI of only 0.20 with dampened monophasic waveforms 11/25/2019. Patient has 2 wound areas 1 on the left lateral heel and a ulcer on his coccyx. These are probably all pressure related although he has significant PAD complicating any attempt to heal a wound on the left heel. We have been using silver collagen on the wound and the coccyx silver alginate on the heel. 11/30; miraculously the left lateral heel wound has healed. The area on his coccyx is not much better. We have been using Prisma He apparently went back to see [name]. His wife paraphrasing says that he had terrible blood flow in the left lower extremity but he is not a candidate for revascularization. Fortunately had enough blood flow to heal the wound I will try to look at Dr. Mee Hives note by the time these next year 12/14; the area on his left lateral heel remains closed The area on his coccyx is about the same in terms of surface area and depth minimal debris however but it is still adherent. We have been using Iodoflex. They stop me before I left the room to show me his right AKA stump. He tells me he is having a lot of pain in this area that is episodic it almost looks like there is bruising in this area but the bruising looks old I asked him about trauma but they were not aware of any. They see vascular  tomorrow 12/28; the area on the coccyx looks absolutely no better. Completely lifeless surface of fibrinous debris and surrounding raised skin. We have been using Iodoflex this is simply not working. He had a fall when he got up forgetting that he was missing his right leg he has skin tears on the right humerus area just above the elbow and on the right forearm. 1/11; the  area on the coccyx is about the same we have been using Santyl although the surface of this is no better. Small punched out wound. He also has skin tears on his arm which are drying up gradually. His wife had called urgently yesterday to be seen. Apparently he developed erythema and swelling of his left foot over the weekend and she was able to express a teaspoon of pus coming from the dorsal foot. She had called her podiatrist who prescribed Keflex however in response to advice from our office she took him to the emergency room. They apparently gave him IV antibiotics and discharged him on doxycycline. His white count was 14.5 with 92% neutrophils. He is not systemically unwell and does not really complain of a lot of pain 3/1; patient has not been here in about 7 weeks. In the interim he was hospitalized on 2 occasions from 01/23/2020 through 02/05/2020. The original admission was for cellulitis of the left leg he received IV antibiotics and Augmentin. It was noted that the right AKA stump broke down ultimately requiring a revision of the AKA by vein and vascular and he went to rehab medicine in hospital from 1/27 through 02/16/2020 He is back at home now. The only wound he has is on the lower coccyx everything else is closed. He is not complaining of any pain in the left leg although there is edema up into his mid thigh. He apparently has had his Lasix increased and this was felt to be secondary to congestive heart failure 3/22. The patient is followed here for a small wound on his lower sacrum/coccyx. He has been using silver  collagen the wound is small with some depth. They asked me to look at a fairly large annular lesion on the left anterior lower leg this is not scaled or painful. It is not pruritic. The patient's wife tells me this just came up over the weekend past 4/12; patient still has a very tiny open area on the lower sacrum/coccyx. I changed him to Iodoflex last time or Iodosorb ointment unknown though there were making too much of a difference here. Since he was last here he fell suffered a laceration on his left medial lower leg that required suturing. He ended up getting admitted from 3/31 through 4/3 apparently for apparently atrial fibrillation. He was put on amiodarone. He returned home he has not been able to tolerate the amiodarone and stopped that. Apparently he was on topical all that was discontinued as well because his blood pressure was too low. He has 3-4+ pitting edema in the left lower leg 4/19; the patient has been using Iodosorb gel to the sacral wound region and silver alginate to the trauma wound on his left leg. He continues to have stitches in place to the left leg wound. Per wife he is not able to offload on the sacral region very well. 4/26; patient presents for 1 week follow-up. He has been using collagen to the sacral wound and silver alginate to the left traumatic leg wound with Kerlix and Coban. He continues to have the stitches in place to his left lower leg. He also developed an area of skin breakdown on his nose from his CPAP machine. 5/2; the patient has a small but punched-out area on the lower sacrum. The skin tear from last week developed a necrotic area. The stitches were removed and the necrotic skin debrided. We have been using silver alginate to this under kerlix Coban Electronic Signature(s) Signed: 05/10/2020 5:25:01  PM By: [name] MD Entered By: [name] on 05/10/2020  16:53:43 -------------------------------------------------------------------------------- Physical Exam Details Patient Name: Date of Service: DO RN, [name] 05/10/2020 2:45 PM Medical Record Number: [id number] Patient Account Number: [id number] Date of Birth/Sex: Treating RN: 04-Sep-1929 (87 y.o. [name] Primary Care Provider: [name] Other Clinician: Referring Provider: Treating Provider/Extender: [name] in Treatment: 25 Constitutional Sitting or standing Blood Pressure is within target range for patient.. Pulse regular and within target range for patient.Marland Kitchen Respirations regular, non-labored and within target range.. Temperature is normal and within the target range for the patient.. Patient is looking more frail. Notes Wound exam Sacral wound this is unchanged small opening nonviable surface. I suspect there is probably undermining here. We have been using silver collagen I would have to open this definitively to get this to close and I am not prepared to do that today. The area on his remaining left leg initially started as a skin tear. The vast majority of this has closed with adherent tissue. However the tip of the inverted triangle is still open I used a #3 curette to remove subcutaneous debris the surface looks quite good. Electronic Signature(s) Signed: 05/10/2020 5:25:01 PM By: [name] MD Entered By: [name] on 05/10/2020 16:57:58 -------------------------------------------------------------------------------- Physician Orders Details Patient Name: Date of Service: DO RN, [name] 05/10/2020 2:45 PM Medical Record Number: [id number] Patient Account Number: [id number] Date of Birth/Sex: Treating RN: 1929-09-25 (87 y.o. 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 - with Dr. Dellia Nims Return A Bathing/ Shower/ Hygiene May shower and wash wound with soap and water. - with dressing changes only. Edema Control - Lymphedema / SCD / Other Elevate legs to the level of the heart or above for 30 minutes daily and/or when sitting, a frequency of: - throughout the day. Off-Loading Turn and reposition every 2 hours [place] wound care orders this week; continue Home Health for wound care. May utilize formulary equivalent dressing for wound treatment orders unless otherwise specified. - Ok for home health to change wrap on left leg this week - LIGHTLY wrap with kerlix and coban. Other Home Health Orders/Instructions: - [facility]ound Treatment Wound #15 - Sacrum Cleanser: Wound Cleanser (Home Health) Every Other Day/30 Days Discharge Instructions: Cleanse the wound with wound cleanser prior to applying a clean dressing using gauze sponges, not tissue or cotton balls. Peri-Wound Care: Skin Prep [facility]) Every Other Day/30 Days Discharge Instructions: Use skin prep as directed Prim Dressing: Promogran Prisma Matrix, 4.34 (sq in) (silver collagen) (Home Health) Every Other Day/30 Days ary Discharge Instructions: Moisten collagen with saline or hydrogel Secondary Dressing: ComfortFoam Border, 3x3 in (silicone border) [facility]) Every Other Day/30 Days Discharge Instructions: Apply over primary dressing as directed. Wound #18 - Lower Leg Wound Laterality: Left, Medial Cleanser: Soap and Water (Home Health) 1 x Per Week/15 Days Discharge Instructions: May shower and wash wound with dial antibacterial  soap and water prior to dressing change. Cleanser: Wound Cleanser (Home Health) 1 x Per Week/15 Days Discharge Instructions:  Cleanse the wound with wound cleanser prior to applying a clean dressing using gauze sponges, not tissue or cotton balls. Cleanser: Wound Cleanser (Home Health) 1 x Per Week/15 Days Discharge Instructions: Cleanse the wound with wound cleanser prior to applying a clean dressing using gauze sponges, not tissue or cotton balls. Peri-Wound Care: Triamcinolone 15 (g) (Home Health) 1 x Per Week/15 Days Discharge Instructions: Use triamcinolone 15 (g) as directed Peri-Wound Care: Sween Lotion (Moisturizing lotion) (Home Health) 1 x Per Week/15 Days Discharge Instructions: Apply moisturizing lotion as directed Prim Dressing: Promogran Prisma Matrix, 4.34 (sq in) (silver collagen) (Home Health) 1 x Per Week/15 Days ary Discharge Instructions: Moisten collagen with saline or hydrogel and place under flap Secondary Dressing: Woven Gauze Sponge, Non-Sterile 4x4 in (Home Health) 1 x Per Week/15 Days Discharge Instructions: Apply over primary dressing as directed. Compression Wrap: Kerlix Roll 4.5x3.1 (in/yd) (Home Health) 1 x Per Week/15 Days Discharge Instructions: erlix/coban wrap should be done very lightl Compression Wrap: Coban Self-Adherent Wrap 4x5 (in/yd) (Home Health) 1 x Per Week/15 Days Discharge Instructions: kerlix/coban wrap should be done very lightl Wound #19 - Nose Topical: Triple Antibiotic Ointment, 1 (oz) Tube 1 x Per Day/30 Days Secondary Dressing: bandaid ([place]) 1 x Per Day/30 Days Electronic Signature(s) Signed: 05/10/2020 5:25:01 PM By: [name] MD Signed: 05/10/2020 5:39:21 PM By: Levan Hurst RN, BSN Entered By: Levan Hurst on 05/10/2020 16:24:15 -------------------------------------------------------------------------------- Problem List Details Patient Name: Date of Service: DO RN, [name] 05/10/2020 2:45 PM Medical Record Number:  [id number] Patient Account Number: [id number] Date of Birth/Sex: Treating RN: [birth year] (87 y.o. 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 11/11/2019 No Yes L98.491 Non-pressure chronic ulcer of skin of other sites limited to breakdown of skin 05/04/2020 No Yes Inactive Problems ICD-10 Code Description Active Date Inactive Date L89.623 Pressure ulcer of left heel, stage 3 11/11/2019 11/11/2019 L03.116 Cellulitis of left lower limb 01/20/2020 01/20/2020 Resolved Problems ICD-10 Code Description Active Date Resolved Date S40.022D Contusion of left upper arm, subsequent encounter 01/06/2020 01/06/2020 Electronic Signature(s) Signed: 05/10/2020 5:25:01 PM By: [name] MD Entered By: [name] on 05/10/2020 16:52:21 -------------------------------------------------------------------------------- Progress Note Details Patient Name: Date of Service: DO RN, [name] 05/10/2020 2:45 PM Medical Record Number: [id number] Patient Account Number: [id number] Date of Birth/Sex: Treating RN: Nov 21, 1929 (87 y.o. [name] Primary Care Provider: [name] Other Clinician: Referring Provider: Treating Provider/Extender: [name] in Treatment: 25 Subjective History of Present Illness (HPI) 01/30/18 on evaluation today patient presents for initial inspection concerning the skin tear of the right forearm. Fortunately there does not appear to be any evidence of infection and this occurred approximately three days ago. He has been using Vaseline over the  region. With that being said the patient does have a history of cataracts COPD, hypertension, peripheral arterial disease, and osteoarthritis. He tells me that he does have a little bit of pain at the site but nothing too significant at this time which is good news. Fortunately this overall appears to be doing fairly well which is good news. No fevers, chills, nausea, or vomiting noted at this time. The biggest issue  at this point is that the wound is of quite significant size. READMISSION 04/21/2019 This is a 87 year old man with multiple medical problems who was seen once in the clinic here in January 2020 with a skin tear on his right forearm seen by [name]. This problem this time started sometime recently. He was noted by podiatry to have erosions and wounds of his right foot on toes 1-3. He also saw [name] of dermatology who gave him Silvadene cream for what I think was felt to be a stasis dermatitis of his bilateral lower legs. At some point he was referred to [name]. He was also noted to have previous noninvasive arterial studies that showed no flow to either one of his toes and noncompressible ABIs bilaterally. [name] did noninvasive arterial studies on him. These showed that he had greater than 75% stenosis in the proximal and mid common femoral artery. Monophasic flow in the posterior tibial and dorsalis pedis positions on the right foot. ABIs again were noncompressible. He was felt to have total occlusion of the superficial femoral artery on the right. The overall interpretation is that he had severe multilevel arterial occlusive disease. Absent femoral pulses and superficial femoral artery occlusions. In spite of this he was not felt to be candidate for arteriography given his bilateral femoral artery occlusions as well he would not be a candidate for endovascular approach secondary to his common femoral artery occlusions and proximal iliac disease. He was not felt to be a  candidate for an open infra inguinal bypass. He therefore felt he would need to be monitored over time with the only option being a right below-knee amputation The patient complains currently of pain at night when he is up in bed. The pain in the right foot is better when he puts the leg down over the bed side. This is compatible with rest pain. He has very limited activity i.e. is even exhausted getting dressed in the morning because of cardiopulmonary issues therefore it is difficult to gauge claudication I believe he was referred here by Dr. [name] with one of the major issues is whether he would be a candidate for hyperbaric oxygen. He is not a diabetic. Dr. [name] did give him Silvadene cream for dry flaking skin in his bilateral lower extremities, however the patient is concerned about using this in the face of sulfa allergy. 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 This is on the medial aspect extended there is a second area on the plantar aspect. Most of what the patient talks about is claudication with minimal activity [however the patient is limited by his COPD], or even at rest at night. He is not getting a lot of sleep. We use silver collagen on this wound 5/4; the patient has a wound on the tip of his right great toe also on the medial aspect. Setting of severe nonrevascularizable PAD [see description in HPI]. He does not describe any change in his pain. We have been using silver collagen 5/18; 2-week follow-up. The patient has wound on the tip of his right great toe as well as the medial aspect. He has developed new wounds x2 on the right lateral foot  and  the right lateral calcaneus. These look like ischemic areas. The patient talks about claudication at night. He seems to be more comfortable during the day. We have been using silver collagen to the wounds. I have previously debrided the eschar on the toes however this comes right back. 6/1; 2-week follow-up. This patient has ischemic wounds on his right foot in the setting of nonunreconstructable PAD [previously reviewed by Dr. Dixon]. He does not really describe severe pain but he does have episodic pain in the right great toe. He has an area on the tip of the right great toe as well as the medial aspect of the right great toe. A new area on the tip of the right fourth toe. He has the area on the right lateral foot fortunately the area on the right lateral heel appears to have epithelialized over. We are using silver collagen to all wounds 06/23/2019 upon evaluation today patient appears to be doing about the same in regard to his foot ulcers. Unfortunately he has 2 new skin tears on his left elbow and the right forearm that were new as of today. He states that it was doing okay until they went to change the dressing and they put some collagen on it that got stuck and somewhat pulled back the skin unfortunately. Fortunately there is no signs of active infection at this time. No fevers, chills, nausea, vomiting, or diarrhea. 6/29; the patient was hospitalized from 6/18 through 6/23 going in with increasing drainage from the wounds on his right foot. He underwent a angiogram on 06/30/2019 by Dr. Donzetta Matters which revealed multilevel arterial disease including occlusion of the right common femoral artery. He reconstitutes the profunda femoris and the below-knee popliteal artery and appears to have posterior tibial peroneal runoff to the ankle although there is heavy calcification of the popliteal below the knee. He was offered consideration of a right above-knee amputation, palliative and/or hospice care and  consideration of a very high risk attempt at bypass. He did receive IV antibiotics. He comes in the clinic today again with the 2 areas that are necrotic on the left great toe and a small punched out area on the right lateral foot. A lot of the erythema on top of the right foot but no warmth. He has a lot of swelling here. The patient is literally tortured over this decision that he needs to make. I went over this with him in exceptional detail. I do not believe he is a candidate for any form of open bypass surgery. I think his vascular doctors would agree with that. He still has pain at night when he puts his leg up on the bed that is relieved by putting his leg dependent. This is not different from what I remember. He is not septic there is no additional skin breakdown that I can see 7/8 the patient continues to have severe pain in the right foot at night making it difficult for him to rest. There is increasing erythema in the right foot but the foot is cold. I do not think this is an infection I think this is likely to be tissue necrosis from ischemia. I thought he would have made an up decision today about going forward with hospice he has not. He did not go to see [name] on 7/6. Again he wants to talk to me at length about amputations. I do not think he will do well after an above-knee amputation on the right. Furthermore I do not  think he is a candidate for bypass surgery on the right leg 7/20; patient still having a lot of pain in the right foot. He saw Dr. Donzetta Matters on 7/16. He is not felt to be able to undergo a complex revascularization and I certainly agree with that. He was offered a right below-knee amputation but he has not made that decision. I have repetitively suggested hospice care if he will not undergo an amputation he is not decided he wants to go that route either. He arrives in the clinic today accompanied by his son who is a Stage manager and his wife. I think the patient has tissue  breakdown in the dorsal foot secondary to ischemia although his son brought up that some of the erythema could be a contact dermatitis as they were apparently layering a large area of pure call over the wound area. I was not aware of this not supposed to be doing it in this fashion. In any case the erythema look better than last week and I am quite convinced this is not cellulitis READMISSION 11/11/2019 [name] is now an 87 year old man who we had in this clinic from [facility] through July/21 largely as a result of ischemic wounds in his right foot. He was followed by vein and vascular [name]. His anatomy was not felt to be amenable to a revascularization. The patient had increasing ischemic pain but was very resistant to the notion of an amputation. Eventually he did go through with this on August 22 he will underwent an AKA by [name]. He went to rehabilitation. There he developed a small stage III lower sacral pressure ulcer and an area on his left lateral heel which is also probably a pressure ulcer. They have been using silver collagen on both wound areas. The patient states he is not in any unrelieved pain. He is in a wheelchair at home with a Roho cushion. He is able to do his own transfers. I think he is probably putting far too much pressure on these areas Past medical history is reviewed. He has a history of CHF, severe PAD, hypertension, coronary artery disease status post CABG, COPD Gold stage IV on O2, hypothyroidism and a recent problem with urinary retention he now has a Foley catheter. He has had problems with left leg swelling and adjustment of his diuretics he is followed by Dr. Daneen Schick. The patient has severe PAD. On 06/30/2019 he had an angiogram on the remaining left side he had heavily diseased femoral artery. His superficial femoral artery was flush occluded with runoff in the profunda. He was felt his SFA was likely occluded and reconstitutes the popliteal artery via  runoff via the peroneal and posterior tibial artery. As noted he is not in a lot of pain. His last noninvasive study was on 6/20. On the left side this was noncompressible ABI but a TBI of only 0.20 with dampened monophasic waveforms 11/25/2019. Patient has 2 wound areas 1 on the left lateral heel and a ulcer on his coccyx. These are probably all pressure related although he has significant PAD complicating any attempt to heal a wound on the left heel. We have been using silver collagen on the wound and the coccyx silver alginate on the heel. 11/30; miraculously the left lateral heel wound has healed. The area on his coccyx is not much better. We have been using Prisma He apparently went back to see [name]. His wife paraphrasing says that he had terrible blood flow in the  left lower extremity but he is not a candidate for revascularization. Fortunately had enough blood flow to heal the wound I will try to look at Dr. Mee Hives note by the time these next year 12/14; the area on his left lateral heel remains closed ooThe area on his coccyx is about the same in terms of surface area and depth minimal debris however but it is still adherent. We have been using Iodoflex. ooThey stop me before I left the room to show me his right AKA stump. He tells me he is having a lot of pain in this area that is episodic it almost looks like there is bruising in this area but the bruising looks old I asked him about trauma but they were not aware of any. They see vascular tomorrow 12/28; the area on the coccyx looks absolutely no better. Completely lifeless surface of fibrinous debris and surrounding raised skin. We have been using Iodoflex this is simply not working. ooHe had a fall when he got up forgetting that he was missing his right leg he has skin tears on the right humerus area just above the elbow and on the right forearm. 1/11; the area on the coccyx is about the same we have been using Santyl although  the surface of this is no better. Small punched out wound. He also has skin tears on his arm which are drying up gradually. His wife had called urgently yesterday to be seen. Apparently he developed erythema and swelling of his left foot over the weekend and she was able to express a teaspoon of pus coming from the dorsal foot. She had called her podiatrist who prescribed Keflex however in response to advice from our office she took him to the emergency room. They apparently gave him IV antibiotics and discharged him on doxycycline. His white count was 14.5 with 92% neutrophils. He is not systemically unwell and does not really complain of a lot of pain 3/1; patient has not been here in about 7 weeks. In the interim he was hospitalized on 2 occasions from 01/23/2020 through 02/05/2020. The original admission was for cellulitis of the left leg he received IV antibiotics and Augmentin. It was noted that the right AKA stump broke down ultimately requiring a revision of the AKA by vein and vascular and he went to rehab medicine in hospital from 1/27 through 02/16/2020 He is back at home now. The only wound he has is on the lower coccyx everything else is closed. He is not complaining of any pain in the left leg although there is edema up into his mid thigh. He apparently has had his Lasix increased and this was felt to be secondary to congestive heart failure 3/22. The patient is followed here for a small wound on his lower sacrum/coccyx. He has been using silver collagen the wound is small with some depth. ooThey asked me to look at a fairly large annular lesion on the left anterior lower leg this is not scaled or painful. It is not pruritic. The patient's wife tells me this just came up over the weekend past 4/12; patient still has a very tiny open area on the lower sacrum/coccyx. I changed him to Iodoflex last time or Iodosorb ointment unknown though there were making too much of a difference here. Since  he was last here he fell suffered a laceration on his left medial lower leg that required suturing. He ended up getting admitted from 3/31 through 4/3 apparently for apparently atrial fibrillation.  He was put on amiodarone. He returned home he has not been able to tolerate the amiodarone and stopped that. Apparently he was on topical all that was discontinued as well because his blood pressure was too low. He has 3-4+ pitting edema in the left lower leg 4/19; the patient has been using Iodosorb gel to the sacral wound region and silver alginate to the trauma wound on his left leg. He continues to have stitches in place to the left leg wound. Per wife he is not able to offload on the sacral region very well. 4/26; patient presents for 1 week follow-up. He has been using collagen to the sacral wound and silver alginate to the left traumatic leg wound with Kerlix and Coban. He continues to have the stitches in place to his left lower leg. He also developed an area of skin breakdown on his nose from his CPAP machine. 5/2; the patient has a small but punched-out area on the lower sacrum. The skin tear from last week developed a necrotic area. The stitches were removed and the necrotic skin debrided. We have been using silver alginate to this under kerlix Coban Objective Constitutional Sitting or standing Blood Pressure is within target range for patient.. Pulse regular and within target range for patient.Marland Kitchen Respirations regular, non-labored and within target range.. Temperature is normal and within the target range for the patient.. Patient is looking more frail. Vitals Time Taken: 3:15 PM, Height: 69 in, Temperature: 98.4 F, Pulse: 129 bpm, Respiratory Rate: 18 breaths/min, Blood Pressure: 107/63 mmHg. General Notes: Wound exam oo Sacral wound this is unchanged small opening nonviable surface. I suspect there is probably undermining here. We have been using silver collagen I would have to open this  definitively to get this to close and I am not prepared to do that today. oo The area on his remaining left leg initially started as a skin tear. The vast majority of this has closed with adherent tissue. However the tip of the inverted triangle is still open I used a #3 curette to remove subcutaneous debris the surface looks quite good. Integumentary (Hair, Skin) Wound #15 status is Open. Original cause of wound was Pressure Injury. The date acquired was: 08/01/2019. The wound has been in treatment 25 weeks. The wound is located on the Sacrum. The wound measures 0.4cm length x 0.2cm width x 0.3cm depth; 0.063cm^2 area and 0.019cm^3 volume. There is Fat Layer (Subcutaneous Tissue) exposed. There is no tunneling noted, however, there is undermining starting at 2:00 and ending at 3:00 with a maximum distance of 0.2cm. There is a medium amount of serosanguineous drainage noted. The wound margin is well defined and not attached to the wound base. There is large (67- 100%) pink, pale granulation within the wound bed. There is no necrotic tissue within the wound bed. General Notes: Periwound erythema Wound #18 status is Open. Original cause of wound was Trauma. The date acquired was: 04/08/2020. The wound has been in treatment 2 weeks. The wound is located on the Left,Medial Lower Leg. The wound measures 3.6cm length x 1.2cm width x 0.2cm depth; 3.393cm^2 area and 0.679cm^3 volume. There is Fat Layer (Subcutaneous Tissue) exposed. There is no tunneling or undermining noted. There is a medium amount of serosanguineous drainage noted. The wound margin is distinct with the outline attached to the wound base. There is medium (34-66%) red, pink granulation within the wound bed. There is a medium (34-66%) amount of necrotic tissue within the wound bed including Adherent Slough.  Wound #19 status is Open. Original cause of wound was Pressure Injury. The date acquired was: 04/20/2020. The wound is located on the Nose.  The wound measures 0.3cm length x 0.3cm width x 0.1cm depth; 0.071cm^2 area and 0.007cm^3 volume. There is Fat Layer (Subcutaneous Tissue) exposed. There is no tunneling or undermining noted. There is a medium amount of serosanguineous drainage noted. The wound margin is distinct with the outline attached to the wound base. There is large (67-100%) red granulation within the wound bed. There is no necrotic tissue within the wound bed. 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 There was a Excisional Skin/Subcutaneous Tissue Debridement with a total area of 4.32 sq cm performed by Ricard Dillon., MD. With the following instrument(s): Curette, Forceps, and Scissors to remove Viable and Non-Viable tissue/material. Material removed includes Subcutaneous Tissue and Slough and. No specimens were taken. A time out was conducted at 16:18, prior to the start of the procedure. A Minimum amount of bleeding was controlled with Pressure. The procedure was tolerated well with a pain level of 0 throughout and a pain level of 0 following the procedure. Post Debridement Measurements: 3.6cm length x 1.2cm width x 0.2cm depth; 0.679cm^3 volume. Character of Wound/Ulcer Post Debridement requires further debridement. Post procedure Diagnosis Wound #18: Same as Pre-Procedure Plan Follow-up Appointments: Return Appointment in 2 weeks. - with Dr. Dellia Nims Bathing/ Shower/ Hygiene: May shower and wash wound with soap and water. - with dressing changes only. Edema Control - Lymphedema / SCD / Other: Elevate legs to the level of the heart or above for 30 minutes daily and/or when sitting, a  frequency of: - throughout the day. Off-Loading: Turn and reposition every 2 hours Home Health: New wound care orders this week; continue Home Health for wound care. May utilize formulary equivalent dressing for wound treatment orders unless otherwise specified. - Ok for home health to change wrap on left leg this week - LIGHTLY wrap with kerlix and coban. Other Home Health Orders/Instructions: - [facility] WOUND #15: - Sacrum Wound Laterality: Cleanser: Wound Cleanser (Home Health) Every Other Day/30 Days Discharge Instructions: Cleanse the wound with wound cleanser prior to applying a clean dressing using gauze sponges, not tissue or cotton balls. Peri-Wound Care: Skin Prep [facility]) Every Other Day/30 Days Discharge Instructions: Use skin prep as directed Prim Dressing: Promogran Prisma Matrix, 4.34 (sq in) (silver collagen) (Home Health) Every Other Day/30 Days ary Discharge Instructions: Moisten collagen with saline or hydrogel Secondary Dressing: ComfortFoam Border, 3x3 in (silicone border) [facility]) Every Other Day/30 Days Discharge Instructions: Apply over primary dressing as directed. WOUND #18: - Lower Leg Wound Laterality: Left, Medial Cleanser: Soap and Water (Home Health) 1 x Per Week/15 Days Discharge Instructions: May shower and wash wound with dial antibacterial soap and water prior to dressing change. Cleanser: Wound Cleanser (Home Health) 1 x Per Week/15 Days Discharge Instructions: Cleanse the wound with wound cleanser prior to applying a clean dressing using gauze sponges, not tissue or cotton balls. Cleanser: Wound Cleanser (Home Health) 1 x Per Week/15 Days Discharge Instructions: Cleanse the wound with wound cleanser prior to applying a clean dressing using gauze sponges, not tissue or cotton balls. Peri-Wound Care: Triamcinolone  15 (g) (Home Health) 1 x Per Week/15 Days Discharge Instructions: Use triamcinolone 15 (g) as directed Peri-Wound Care: Sween Lotion  (Moisturizing lotion) (Home Health) 1 x Per Week/15 Days Discharge Instructions: Apply moisturizing lotion as directed Prim Dressing: Promogran Prisma Matrix, 4.34 (sq in) (silver collagen) (Home Health) 1 x Per Week/15 Days ary Discharge Instructions: Moisten collagen with saline or hydrogel and place under flap Secondary Dressing: Woven Gauze Sponge, Non-Sterile 4x4 in (Home Health) 1 x Per Week/15 Days Discharge Instructions: Apply over primary dressing as directed. Com pression Wrap: Kerlix Roll 4.5x3.1 (in/yd) (Home Health) 1 x Per Week/15 Days Discharge Instructions: erlix/coban wrap should be done very lightl Com pression Wrap: Coban Self-Adherent Wrap 4x5 (in/yd) (Home Health) 1 x Per Week/15 Days Discharge Instructions: kerlix/coban wrap should be done very lightl WOUND #19: - Nose Wound Laterality: Topical: Triple Antibiotic Ointment, 1 (oz) Tube 1 x Per Day/30 Days Secondary Dressing: bandaid ([facility]) 1 x Per Day/30 Days 1. I agree with the silver collagen to the left leg. He has pitting edema here which I think is chronic venous insufficiency but he also has severe PAD. No more aggressive compression kerlix Coban will be possible. 2. The open wound over his sacrum has a small orifice but with some depth. The surface underneath this is nonviable. I am thinking that it would likely need an aggressive debridement to get a better base to this versus simply palliative dressings for now I have opted for the latter rather than extensively discussing this but I will at some point Electronic Signature(s) Signed: 05/10/2020 5:25:01 PM By: [name] MD Entered By: [name] on 05/10/2020 16:59:34 -------------------------------------------------------------------------------- SuperBill Details Patient Name: Date of Service: DO RN, [name] 05/10/2020 Medical Record Number: [id number] Patient Account Number: [id number] Date of Birth/Sex: Treating RN: 04-10-1929 (87 y.o. 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

## 2020-05-10 NOTE — Progress Notes (Signed)
[name], [name] ([id number]) Visit Report for 05/10/2020 Arrival Information Details Patient Name: Date of Service: DO RN, [name] 05/10/2020 2:45 PM Medical Record Number: [id number] Patient Account Number: [id number] Date of Birth/Sex: Treating RN: 23-Jan-1929 (87 y.o. [name] Primary Care [name]: [name] Other Clinician: Referring [name]: Treating [name]/Extender: [name] in Treatment: 25 Visit Information History Since Last Visit Added or deleted any medications: No Patient Arrived: Wheel Chair Any new allergies or adverse reactions: No Arrival Time: 15:07 Had a fall or experienced change in No Accompanied By: wife activities of daily living that may affect Transfer Assistance: Manual risk of falls: Patient Identification Verified: Yes Signs or symptoms of abuse/neglect since last visito No Secondary Verification Process Completed: Yes Hospitalized since last visit: No Patient Requires Transmission-Based Precautions: No Implantable device outside of the clinic excluding No Patient Has Alerts: Yes cellular tissue based products placed in the center Patient Alerts: L ABI N/C, TBI = .20 since last visit: Has Dressing in Place as Prescribed: Yes Pain Present Now: No Electronic Signature(s) Signed: 05/10/2020 5:00:30 PM By: [name] Entered By: [name] on 05/10/2020 15:15:32 -------------------------------------------------------------------------------- Encounter Discharge Information Details Patient Name: Date of Service: DO RN, [name] 05/10/2020 2:45 PM Medical Record Number: [id number] Patient Account Number: [id number] Date of Birth/Sex: Treating RN: 08-May-1929 (87 y.o. [name] Primary Care [name]: [name] Other Clinician: Referring [name]: Treating [name]/Extender: [name] in Treatment: 25 Encounter Discharge Information Items Post Procedure  Vitals Discharge Condition: Stable Temperature (F): 98.4 Ambulatory Status: Wheelchair Pulse (bpm): 129 Discharge Destination: Home Respiratory Rate (breaths/min): 18 Transportation: Private Auto Blood Pressure (mmHg): 107/63 Accompanied By: wife Schedule Follow-up Appointment: Yes Clinical Summary of Care: Electronic Signature(s) Signed: 05/10/2020 5:27:54 PM By: [name] Entered By: [name] on 05/10/2020 17:16:50 -------------------------------------------------------------------------------- Lower Extremity Assessment Details Patient Name: Date of Service: DO RN, [name] 05/10/2020 2:45 PM Medical Record Number: [id number] Patient Account Number: [id number] Date of Birth/Sex: Treating RN: 20-Jan-1929 (87 y.o. [name] Primary Care Chika Cichowski: [name] Other Clinician: Referring Aisea Bouldin: Treating [name]/Extender: [name] in Treatment: 25 Edema Assessment Assessed: Shirlyn Goltz: Yes] [name]: No] Edema: [Left: Ye] [Right: s] Calf Left: Right: Point of Measurement: 9 cm From Medial Instep 25 cm Ankle Left: Right: Point of Measurement: 29 cm From Medial Instep 21.5 cm Notes Red splotchy areas from ankle down the foot Electronic Signature(s) Signed: 05/10/2020 5:21:21 PM By: [name] Entered By: [name] on 05/10/2020 15:26:23 -------------------------------------------------------------------------------- Multi Wound Chart Details Patient Name: Date of Service: DO RN, [name] 05/10/2020 2:45 PM Medical Record Number: [id number] Patient Account Number: [id number] Date of Birth/Sex: Treating RN: [birth year] (87 y.o. [name] Primary Care [name]: [name] Other Clinician: Referring Ivyana Locey: Treating [name]/Extender: [name] in Treatment: 25 Vital Signs Height(in): 71 Pulse(bpm): 129 Weight(lbs): Blood Pressure(mmHg): 107/63 Body Mass  Index(BMI): Temperature(F): 98.4 Respiratory Rate(breaths/min): 18 Photos: [15:No Photos Sacrum] [18:No Photos Left, Medial Lower Leg] [19:No Photos Nose] Wound Location: [15:Pressure Injury] [18:Trauma] [19:Pressure Injury] Wounding Event: [15:Pressure Ulcer] [18:Trauma, Other] [19:Pressure Ulcer] Primary Etiology: [15:Cataracts, Asthma, Chronic] [18:Cataracts, Asthma, Chronic] [19:Cataracts, Asthma, Chronic] Comorbid History: [15:Obstructive Pulmonary Disease (COPD), Congestive Heart Failure, Coronary Artery Disease, Hypertension, Peripheral Arterial Disease, Osteoarthritis, Confinement Anxiety 08/01/2019] [18:Obstructive Pulmonary Disease (COPD), Congestive  Heart Failure, Coronary Artery Disease, Hypertension, Peripheral Arterial Disease, Osteoarthritis, Confinement Anxiety 04/08/2020] [19:Obstructive Pulmonary Disease (COPD), Congestive Heart Failure, Coronary Artery  Disease, Hypertension, Peripheral  Arterial Disease, Osteoarthritis, Confinement Anxiety 04/20/2020] Date Acquired: [15:25] [18:2] [19:0] Weeks of Treatment: [15:Open] [18:Open] [19:Open] Wound Status: [15:0.4x0.2x0.3] [18:3.6x1.2x0.2] [19:0.3x0.3x0.1] Measurements L x W x D (cm) [15:0.063] [18:3.393] [19:0.071] A (cm) : rea [15:0.019] [18:0.679] [19:0.007] Volume (cm) : [15:71.40%] [18:-23.40%] [19:49.60%] % Reduction in A rea: [15:13.60%] [18:-146.90%] [19:50.00%] % Reduction in Volume: [15:2] Starting Position 1 (o'clock): [15:3] Ending Position 1 (o'clock): [15:0.2] Maximum Distance 1 (cm): [15:Yes] [18:No] [19:No] Undermining: [15:Category/Stage III] [18:Full Thickness Without Exposed] [19:Category/Stage II] Classification: [15:Medium] [18:Support Structures Medium] [19:Medium] Exudate A mount: [15:Serosanguineous] [18:Serosanguineous] [19:Serosanguineous] Exudate Type: [15:red, brown] [18:red, brown] [19:red, brown] Exudate Color: [15:Well defined, not attached] [18:Distinct, outline attached] [19:Distinct, outline  attached] Wound Margin: [15:Large (67-100%)] [18:Medium (34-66%)] [19:Large (67-100%)] Granulation A mount: [15:Pink, Pale] [18:Red, Pink] [19:Red] Granulation Quality: [15:None Present (0%)] [18:Medium (34-66%)] [19:None Present (0%)] Necrotic A mount: [15:Fat Layer (Subcutaneous Tissue): Yes Fat Layer (Subcutaneous Tissue): Yes Fat Layer (Subcutaneous Tissue): Yes] Exposed Structures: [15:Fascia: No Tendon: No Muscle: No Joint: No Bone: No Small (1-33%)] [18:Fascia: No Tendon: No Muscle: No Joint: No Bone: No Small (1-33%)] [19:Fascia: No Tendon: No Muscle: No Joint: No Bone: No Small (1-33%)] Epithelialization: [15:N/A] [18:Debridement - Excisional] [19:N/A] Debridement: Pre-procedure Verification/Time Out N/A [18:16:18] [19:N/A] Taken: [15:N/A] [18:Subcutaneous, Slough] [19:N/A] Tissue Debrided: [15:N/A] [18:Skin/Subcutaneous Tissue] [19:N/A] Level: [15:N/A] [18:4.32] [19:N/A] Debridement A (sq cm): [15:rea N/A] [18:Curette, Forceps, Scissors] [19:N/A] Instrument: [15:N/A] [18:Minimum] [19:N/A] Bleeding: [15:N/A] [18:Pressure] [19:N/A] Hemostasis A chieved: [15:N/A] [18:0] [19:N/A] Procedural Pain: [15:N/A] [18:0] [19:N/A] Post Procedural Pain: [15:N/A] [18:Procedure was tolerated well] [19:N/A] Debridement Treatment Response: [15:N/A] [18:3.6x1.2x0.2] [19:N/A] Post Debridement Measurements L x W x D (cm) [15:N/A] [18:0.679] [19:N/A] Post Debridement Volume: (cm) [15:Periwound erythema] [18:N/A] [19:N/A] Assessment Notes: [15:N/A] [18:Debridement] [19:N/A] Treatment Notes Electronic Signature(s) Signed: 05/10/2020 5:25:01 PM By: [name] MD Signed: 05/10/2020 5:39:21 PM By: Levan Hurst RN, BSN Entered By: [name] on 05/10/2020 16:52:29 -------------------------------------------------------------------------------- Multi-Disciplinary Care Plan Details Patient Name: Date of Service: DO RN, [name] 05/10/2020 2:45 PM Medical Record Number: [id number] Patient  Account Number: [id number] Date of Birth/Sex: Treating RN: 1929-10-27 (87 y.o. [name] Primary Care Demitria Hay: [name] Other Clinician: Referring Jaydrien Wassenaar: Treating Calvert Charland/Extender: [name] in Treatment: 25 Active Inactive Wound/Skin Impairment Nursing Diagnoses: Knowledge deficit related to ulceration/compromised skin integrity Goals: Patient/caregiver will verbalize understanding of skin care regimen Date Initiated: 11/11/2019 Target Resolution Date: 06/04/2020 Goal Status: Active Ulcer/skin breakdown will have a volume reduction of 30% by week 4 Date Initiated: 11/11/2019 Target Resolution Date: 06/04/2020 Goal Status: Active Interventions: Assess patient/caregiver ability to obtain necessary supplies Assess patient/caregiver ability to perform ulcer/skin care regimen upon admission and as needed Assess ulceration(s) every visit Notes: Electronic Signature(s) Signed: 05/10/2020 5:39:21 PM By: Levan Hurst RN, BSN Entered By: Levan Hurst on 05/10/2020 17:02:37 -------------------------------------------------------------------------------- Pain Assessment Details Patient Name: Date of Service: DO RN, [name] 05/10/2020 2:45 PM Medical Record Number: [id number] Patient Account Number: [id number] Date of Birth/Sex: Treating RN: Feb 13, 1929 (87 y.o. [name] Primary Care Notnamed Scholz: [name] Other Clinician: Referring Kenechukwu Eckstein: Treating Esli Clements/Extender: [name] in Treatment: 25 Active Problems Location of Pain Severity and Description of Pain Patient Has Paino No Site Locations Pain Management and Medication Current Pain Management: Electronic Signature(s) Signed: 05/10/2020 5:00:30 PM By: [name] Signed: 05/10/2020 5:39:21 PM By: Levan Hurst RN, BSN Entered By: [name] on 05/10/2020  15:15:55 -------------------------------------------------------------------------------- Patient/Caregiver Education Details Patient Name: Date of Service: DO RN,  [name] 5/2/2022andnbsp2:45 PM Medical Record Number: [id number] Patient Account Number: [id number] Date of Birth/Gender: Treating RN: [birth year] (87 y.o. [name] Primary Care Physician: [name] Other Clinician: Referring Physician: Treating Physician/Extender: [name] in Treatment: 25 Education Assessment Education Provided To: Patient Education Topics Provided Wound/Skin Impairment: Methods: Explain/Verbal Responses: State content correctly [employer]) Signed: 05/10/2020 5:39:21 PM By: Levan Hurst RN, BSN Entered By: Levan Hurst on 05/10/2020 17:02:48 -------------------------------------------------------------------------------- Wound Assessment Details Patient Name: Date of Service: DO RN, [name] 05/10/2020 2:45 PM Medical Record Number: [id number] Patient Account Number: [id number] Date of Birth/Sex: Treating RN: 1929-05-04 (87 y.o. [name] Primary Care Kama Cammarano: [name] Other Clinician: Referring Stephaney Steven: Treating Jamario Colina/Extender: [name] in Treatment: 25 Wound Status Wound Number: 15 Primary Pressure Ulcer Etiology: Wound Location: Sacrum Wound Open Wounding Event: Pressure Injury Status: Date Acquired: 08/01/2019 Comorbid Cataracts, Asthma, Chronic Obstructive Pulmonary Disease Weeks Of Treatment: 25 History: (COPD), Congestive Heart Failure, Coronary Artery Disease, Clustered Wound: No Hypertension, Peripheral Arterial Disease, Osteoarthritis, Confinement Anxiety Wound Measurements Length: (cm) 0.4 Width: (cm) 0.2 Depth: (cm) 0.3 Area: (cm) 0.063 Volume: (cm) 0.019 % Reduction in Area: 71.4% % Reduction in Volume: 13.6% Epithelialization: Small (1-33%) Tunneling:  No Undermining: Yes Starting Position (o'clock): 2 Ending Position (o'clock): 3 Maximum Distance: (cm) 0.2 Wound Description Classification: Category/Stage III Wound Margin: Well defined, not attached Exudate Amount: Medium Exudate Type: Serosanguineous Exudate Color: red, brown Foul Odor After Cleansing: No Slough/Fibrino No Wound Bed Granulation Amount: Large (67-100%) Exposed Structure Granulation Quality: Pink, Pale Fascia Exposed: No Necrotic Amount: None Present (0%) Fat Layer (Subcutaneous Tissue) Exposed: Yes Tendon Exposed: No Muscle Exposed: No Joint Exposed: No Bone Exposed: No Assessment Notes Periwound erythema Treatment Notes Wound #15 (Sacrum) Cleanser Wound Cleanser Discharge Instruction: Cleanse the wound with wound cleanser prior to applying a clean dressing using gauze sponges, not tissue or cotton balls. Peri-Wound Care Skin Prep Discharge Instruction: Use skin prep as directed Topical Primary Dressing Promogran Prisma Matrix, 4.34 (sq in) (silver collagen) Discharge Instruction: Moisten collagen with saline or hydrogel Secondary Dressing ComfortFoam Border, 3x3 in (silicone border) Discharge Instruction: Apply over primary dressing as directed. Secured With Compression Wrap Compression Stockings Environmental education officer) Signed: 05/10/2020 5:21:21 PM By: [name] Entered By: [name] on 05/10/2020 15:32:45 -------------------------------------------------------------------------------- Wound Assessment Details Patient Name: Date of Service: DO RN, [name] 05/10/2020 2:45 PM Medical Record Number: [id number] Patient Account Number: [id number] Date of Birth/Sex: Treating RN: 25-Sep-1929 (87 y.o. [name] Primary Care Vida Nicol: [name] Other Clinician: Referring Johann Santone: Treating Buren Havey/Extender: [name] in Treatment: 25 Wound Status Wound Number: 18 Primary Trauma,  Other Etiology: Wound Location: Left, Medial Lower Leg Wound Open Wounding Event: Trauma Status: Date Acquired: 04/08/2020 Comorbid Cataracts, Asthma, Chronic Obstructive Pulmonary Disease Weeks Of Treatment: 2 History: (COPD), Congestive Heart Failure, Coronary Artery Disease, Clustered Wound: No Hypertension, Peripheral Arterial Disease, Osteoarthritis, Confinement Anxiety Photos Wound Measurements Length: (cm) 3.6 Width: (cm) 1.2 Depth: (cm) 0.2 Area: (cm) 3.393 Volume: (cm) 0.679 % Reduction in Area: -23.4% % Reduction in Volume: -146.9% Epithelialization: Small (1-33%) Tunneling: No Undermining: No Wound Description Classification: Full Thickness Without Exposed Support Structures Wound Margin: Distinct, outline attached Exudate Amount: Medium Exudate Type: Serosanguineous Exudate Color: red, brown Foul Odor After Cleansing: No Slough/Fibrino Yes Wound Bed Granulation Amount: Medium (34-66%) Exposed Structure Granulation Quality: Red, Pink Fascia Exposed: No Necrotic Amount: Medium (34-66%) Fat Layer (Subcutaneous  Tissue) Exposed: Yes Necrotic Quality: Adherent Slough Tendon Exposed: No Muscle Exposed: No Joint Exposed: No Bone Exposed: No Treatment Notes Wound #18 (Lower Leg) Wound Laterality: Left, Medial Cleanser Soap and Water Discharge Instruction: May shower and wash wound with dial antibacterial soap and water prior to dressing change. Wound Cleanser Discharge Instruction: Cleanse the wound with wound cleanser prior to applying a clean dressing using gauze sponges, not tissue or cotton balls. Wound Cleanser Discharge Instruction: Cleanse the wound with wound cleanser prior to applying a clean dressing using gauze sponges, not tissue or cotton balls. Peri-Wound Care Triamcinolone 15 (g) Discharge Instruction: Use triamcinolone 15 (g) as directed Sween Lotion (Moisturizing lotion) Discharge Instruction: Apply moisturizing lotion as  directed Topical Primary Dressing Promogran Prisma Matrix, 4.34 (sq in) (silver collagen) Discharge Instruction: Moisten collagen with saline or hydrogel and place under flap Secondary Dressing Woven Gauze Sponge, Non-Sterile 4x4 in Discharge Instruction: Apply over primary dressing as directed. Secured With Compression Wrap Kerlix Roll 4.5x3.1 (in/yd) Discharge Instruction: erlix/coban wrap should be done very lightl Coban Self-Adherent Wrap 4x5 (in/yd) Discharge Instruction: kerlix/coban wrap should be done very lightl Compression Stockings Add-Ons Electronic Signature(s) Signed: 05/10/2020 5:00:30 PM By: [name] Signed: 05/10/2020 5:39:21 PM By: Levan Hurst RN, BSN Entered By: [name] on 05/10/2020 16:57:58 -------------------------------------------------------------------------------- Wound Assessment Details Patient Name: Date of Service: DO RN, [name] 05/10/2020 2:45 PM Medical Record Number: [id number] Patient Account Number: [id number] Date of Birth/Sex: Treating RN: 1929/12/05 (87 y.o. [name] Primary Care Mendi Constable: [name] Other Clinician: Referring Steffon Gladu: Treating Cing /Extender: [name] in Treatment: 25 Wound Status Wound Number: 19 Primary Pressure Ulcer Etiology: Wound Location: Nose Wound Open Wounding Event: Pressure Injury Status: Date Acquired: 04/20/2020 Comorbid Cataracts, Asthma, Chronic Obstructive Pulmonary Disease Weeks Of Treatment: 0 History: (COPD), Congestive Heart Failure, Coronary Artery Disease, Clustered Wound: No Hypertension, Peripheral Arterial Disease, Osteoarthritis, Confinement Anxiety Photos Wound Measurements Length: (cm) 0.3 Width: (cm) 0.3 Depth: (cm) 0.1 Area: (cm) 0.071 Volume: (cm) 0.007 % Reduction in Area: 49.6% % Reduction in Volume: 50% Epithelialization: Small (1-33%) Tunneling: No Undermining: No Wound Description Classification:  Category/Stage II Wound Margin: Distinct, outline attached Exudate Amount: Medium Exudate Type: Serosanguineous Exudate Color: red, brown Foul Odor After Cleansing: No Slough/Fibrino No Wound Bed Granulation Amount: Large (67-100%) Exposed Structure Granulation Quality: Red Fascia Exposed: No Necrotic Amount: None Present (0%) Fat Layer (Subcutaneous Tissue) Exposed: Yes Tendon Exposed: No Muscle Exposed: No Joint Exposed: No Bone Exposed: No Treatment Notes Wound #19 (Nose) Cleanser Peri-Wound Care Topical Triple Antibiotic Ointment, 1 (oz) Tube Primary Dressing Secondary Dressing bandaid Secured With Compression Wrap Compression Stockings Add-Ons Electronic Signature(s) Signed: 05/10/2020 5:00:30 PM By: [name] Signed: 05/10/2020 5:39:21 PM By: Levan Hurst RN, BSN Entered By: [name] on 05/10/2020 16:58:15 -------------------------------------------------------------------------------- Vitals Details Patient Name: Date of Service: DO RN, [name] 05/10/2020 2:45 PM Medical Record Number: [id number] Patient Account Number: [id number] Date of Birth/Sex: Treating RN: [birth year] (87 y.o. [name] Primary Care Riad Wagley: [name] Other Clinician: Referring Gricel Copen: Treating Cesia Orf/Extender: [name] in Treatment: 25 Vital Signs Time Taken: 15:15 Temperature (F): 98.4 Height (in): 69 Pulse (bpm): 129 Respiratory Rate (breaths/min): 18 Blood Pressure (mmHg): 107/63 Reference Range: 80 - 120 mg / dl Electronic Signature(s) Signed: 05/10/2020 5:00:30 PM By: [name] Entered By: [name] on 05/10/2020 15:15:49

## 2020-05-13 DIAGNOSIS — I739 Peripheral vascular disease, unspecified: Secondary | ICD-10-CM | POA: Diagnosis not present

## 2020-05-13 DIAGNOSIS — J9611 Chronic respiratory failure with hypoxia: Secondary | ICD-10-CM | POA: Diagnosis not present

## 2020-05-13 DIAGNOSIS — Z4781 Encounter for orthopedic aftercare following surgical amputation: Secondary | ICD-10-CM | POA: Diagnosis not present

## 2020-05-13 DIAGNOSIS — I5032 Chronic diastolic (congestive) heart failure: Secondary | ICD-10-CM | POA: Diagnosis not present

## 2020-05-13 DIAGNOSIS — L89152 Pressure ulcer of sacral region, stage 2: Secondary | ICD-10-CM | POA: Diagnosis not present

## 2020-05-13 DIAGNOSIS — I110 Hypertensive heart disease with heart failure: Secondary | ICD-10-CM | POA: Diagnosis not present

## 2020-05-14 DIAGNOSIS — I739 Peripheral vascular disease, unspecified: Secondary | ICD-10-CM | POA: Diagnosis not present

## 2020-05-14 DIAGNOSIS — L89152 Pressure ulcer of sacral region, stage 2: Secondary | ICD-10-CM | POA: Diagnosis not present

## 2020-05-14 DIAGNOSIS — I5032 Chronic diastolic (congestive) heart failure: Secondary | ICD-10-CM | POA: Diagnosis not present

## 2020-05-14 DIAGNOSIS — I110 Hypertensive heart disease with heart failure: Secondary | ICD-10-CM | POA: Diagnosis not present

## 2020-05-14 DIAGNOSIS — J9611 Chronic respiratory failure with hypoxia: Secondary | ICD-10-CM | POA: Diagnosis not present

## 2020-05-14 DIAGNOSIS — Z4781 Encounter for orthopedic aftercare following surgical amputation: Secondary | ICD-10-CM | POA: Diagnosis not present

## 2020-05-14 NOTE — Progress Notes (Signed)
Brophy, Skyy [name] ([id number]) Visit Report for 04/27/2020 Chief Complaint Document Details Patient Name: Date of Service: DO RN, [place] V ID L. 04/27/2020 2:45 PM Medical Record Number: [id number] Patient Account Number: [id number] Date of Birth/Sex: Treating RN: 07/28/1929 (87 y.o. [name], [name] Primary Care Provider: [name] Other Clinician: Referring Provider: Treating Provider/Extender: Charlestine Massed in Treatment: 24 Information Obtained from: Patient Chief Complaint Right forearm skin tear 04/21/2019; patient is here for review of wound on the right medial great toe 06/23/2019: patient has new skin tears to the right forearm and left elbow 11/11/2019; patient is here for a wound on the left lateral heel and sacrum 04/29/2020; sacral wound and left medial lower leg trauma wound Electronic Signature(s) Signed: 04/29/2020 10:09:28 AM By: Kalman Shan DO Entered By: Kalman Shan on 04/29/2020 09:43:38 -------------------------------------------------------------------------------- HPI Details Patient Name: Date of Service: DO RN, [name] 04/27/2020 2:45 PM Medical Record Number: [id number] Patient Account Number: [id number] Date of Birth/Sex: Treating RN: 1929/03/19 (87 y.o. [name] Primary Care Provider: [name] Other Clinician: Referring Provider: Treating Provider/Extender: Charlestine Massed in Treatment: 24 History of Present Illness HPI Description: 01/30/18 on evaluation today patient presents for initial inspection concerning the skin tear of the right forearm. Fortunately there does not appear to be any evidence of infection and this occurred approximately three days ago. He has been using Vaseline over the region. With that being said the patient does have a history of cataracts COPD, hypertension, peripheral arterial disease, and osteoarthritis. He tells me that he does have a little bit of pain at the  site but nothing too significant at this time which is good news. Fortunately this overall appears to be doing fairly well which is good news. No fevers, chills, nausea, or vomiting noted at this time. The biggest issue at this point is that the wound is of quite significant size. READMISSION 04/21/2019 This is a 87 year old man with multiple medical problems who was seen once in the clinic here in January 2020 with a skin tear on his right forearm seen by [name]. This problem this time started sometime recently. He was noted by podiatry to have erosions and wounds of his right foot on toes 1-3. He also saw [name] of dermatology who gave him Silvadene cream for what I think was felt to be a stasis dermatitis of his bilateral lower legs. At some point he was referred to [name]. He was also noted to have previous noninvasive arterial studies that showed no flow to either one of his toes and noncompressible ABIs bilaterally. [name] did noninvasive arterial studies on him. These showed that he had greater than 75% stenosis in the proximal and mid common femoral artery. Monophasic flow in the posterior tibial and dorsalis pedis positions on the right foot. ABIs again were noncompressible. He was felt to have total occlusion of the superficial femoral artery on the right. The overall interpretation is that he had severe multilevel arterial occlusive disease. Absent femoral pulses and superficial femoral artery occlusions. In spite of this he was not felt to be candidate for arteriography given his bilateral femoral artery occlusions as well he would not be a candidate for endovascular approach secondary to his common femoral artery occlusions and proximal iliac disease. He was not felt to be a candidate for an open infra inguinal bypass. He therefore felt he would need to be monitored over time with the only option being a  right below-knee amputation The patient complains currently of pain at  night when he is up in bed. The pain in the right foot is better when he puts the leg down over the bed side. This is compatible with rest pain. He has very limited activity i.e. is even exhausted getting dressed in the morning because of cardiopulmonary issues therefore it is difficult to gauge claudication I believe he was referred here by Dr. [name] with one of the major issues is whether he would be a candidate for hyperbaric oxygen. He is not a diabetic. Dr. [name] did give him Silvadene cream for dry flaking skin in his bilateral lower extremities, however the patient is concerned about using this in the face of sulfa allergy. 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 This is on the medial aspect extended there is a second area on the plantar aspect. Most of what the patient talks about is claudication with minimal activity [however the patient is limited by his COPD], or even at rest at night. He is not getting a lot of sleep. We use silver collagen on this wound 5/4; the patient has a wound on the tip of his right great toe also on the medial aspect. Setting of severe nonrevascularizable PAD [see description in HPI]. He does not describe any change in his pain. We have been using silver collagen 5/18; 2-week follow-up. The patient has wound on the tip of his right great toe as well as the medial aspect. He has developed new wounds x2 on the right lateral foot and the right lateral calcaneus. These look like ischemic areas. The patient talks about claudication at night. He seems to be more comfortable during the day. We have been using silver collagen to the wounds.  I have previously debrided the eschar on the toes however this comes right back. 6/1; 2-week follow-up. This patient has ischemic wounds on his right foot in the setting of nonunreconstructable PAD [previously reviewed by Dr. Dixon]. He does not really describe severe pain but he does have episodic pain in the right great toe. He has an area on the tip of the right great toe as well as the medial aspect of the right great toe. A new area on the tip of the right fourth toe. He has the area on the right lateral foot fortunately the area on the right lateral heel appears to have epithelialized over. We are using silver collagen to all wounds 06/23/2019 upon evaluation today patient appears to be doing about the same in regard to his foot ulcers. Unfortunately he has 2 new skin tears on his left elbow and the right forearm that were new as of today. He states that it was doing okay until they went to change the dressing and they put some collagen on it that got stuck and somewhat pulled back the skin unfortunately. Fortunately there is no signs of active infection at this time. No fevers, chills, nausea, vomiting, or diarrhea. 6/29; the patient was hospitalized from 6/18 through 6/23 going in with increasing drainage from the wounds on his right foot. He underwent a angiogram on 06/30/2019 by Dr. Donzetta Matters which revealed multilevel arterial disease including occlusion of the  right common femoral artery. He reconstitutes the profunda femoris and the below-knee popliteal artery and appears to have posterior tibial peroneal runoff to the ankle although there is heavy calcification of the popliteal below the knee. He was offered consideration of a right above-knee amputation, palliative and/or hospice care and consideration of a very high risk attempt at bypass. He did receive IV antibiotics. He comes in the clinic today again with the 2 areas that are necrotic on the left great toe and a small punched out area on  the right lateral foot. A lot of the erythema on top of the right foot but no warmth. He has a lot of swelling here. The patient is literally tortured over this decision that he needs to make. I went over this with him in exceptional detail. I do not believe he is a candidate for any form of open bypass surgery. I think his vascular doctors would agree with that. He still has pain at night when he puts his leg up on the bed that is relieved by putting his leg dependent. This is not different from what I remember. He is not septic there is no additional skin breakdown that I can see 7/8 the patient continues to have severe pain in the right foot at night making it difficult for him to rest. There is increasing erythema in the right foot but the foot is cold. I do not think this is an infection I think this is likely to be tissue necrosis from ischemia. I thought he would have made an up decision today about going forward with hospice he has not. He did not go to see [name] on 7/6. Again he wants to talk to me at length about amputations. I do not think he will do well after an above-knee amputation on the right. Furthermore I do not think he is a candidate for bypass surgery on the right leg 7/20; patient still having a lot of pain in the right foot. He saw Dr. Donzetta Matters on 7/16. He is not felt to be able to undergo a complex revascularization and I certainly agree with that. He was offered a right below-knee amputation but he has not made that decision. I have repetitively suggested hospice care if he will not undergo an amputation he is not decided he wants to go that route either. He arrives in the clinic today accompanied by his son who is a Stage manager and his wife. I think the patient has tissue breakdown in the dorsal foot secondary to ischemia although his son brought up that some of the erythema could be a contact dermatitis as they were apparently layering a large area of pure call over the  wound area. I was not aware of this not supposed to be doing it in this fashion. In any case the erythema look better than last week and I am quite convinced this is not cellulitis READMISSION 11/11/2019 Mr. Paula Libra is now an 87 year old man who we had in this clinic from [place] through July/21 largely as a result of ischemic wounds in his right foot. He was followed by vein and vascular [name]. His anatomy was not felt to be amenable to a revascularization. The patient had increasing ischemic pain but was very resistant to the notion of an amputation. Eventually he did go through with this on August 22 he will underwent an AKA by [name]. He went to rehabilitation. There he developed a small stage III lower sacral pressure ulcer and  an area on his left lateral heel which is also probably a pressure ulcer. They have been using silver collagen on both wound areas. The patient states he is not in any unrelieved pain. He is in a wheelchair at home with a Roho cushion. He is able to do his own transfers. I think he is probably putting far too much pressure on these areas Past medical history is reviewed. He has a history of CHF, severe PAD, hypertension, coronary artery disease status post CABG, COPD Gold stage IV on O2, hypothyroidism and a recent problem with urinary retention he now has a Foley catheter. He has had problems with left leg swelling and adjustment of his diuretics he is followed by Dr. Daneen Schick. The patient has severe PAD. On 06/30/2019 he had an angiogram on the remaining left side he had heavily diseased femoral artery. His superficial femoral artery was flush occluded with runoff in the profunda. He was felt his SFA was likely occluded and reconstitutes the popliteal artery via runoff via the peroneal and posterior tibial artery. As noted he is not in a lot of pain. His last noninvasive study was on 6/20. On the left side this was noncompressible ABI but a TBI of only 0.20 with  dampened monophasic waveforms 11/25/2019. Patient has 2 wound areas 1 on the left lateral heel and a ulcer on his coccyx. These are probably all pressure related although he has significant PAD complicating any attempt to heal a wound on the left heel. We have been using silver collagen on the wound and the coccyx silver alginate on the heel. 11/30; miraculously the left lateral heel wound has healed. The area on his coccyx is not much better. We have been using Prisma He apparently went back to see [name]. His wife paraphrasing says that he had terrible blood flow in the left lower extremity but he is not a candidate for revascularization. Fortunately had enough blood flow to heal the wound I will try to look at Dr. Mee Hives note by the time these next year 12/14; the area on his left lateral heel remains closed The area on his coccyx is about the same in terms of surface area and depth minimal debris however but it is still adherent. We have been using Iodoflex. They stop me before I left the room to show me his right AKA stump. He tells me he is having a lot of pain in this area that is episodic it almost looks like there is bruising in this area but the bruising looks old I asked him about trauma but they were not aware of any. They see vascular tomorrow 12/28; the area on the coccyx looks absolutely no better. Completely lifeless surface of fibrinous debris and surrounding raised skin. We have been using Iodoflex this is simply not working. He had a fall when he got up forgetting that he was missing his right leg he has skin tears on the right humerus area just above the elbow and on the right forearm. 1/11; the area on the coccyx is about the same we have been using Santyl although the surface of this is no better. Small punched out wound. He also has skin tears on his arm which are drying up gradually. His wife had called urgently yesterday to be seen. Apparently he developed erythema and  swelling of his left foot over the weekend and she was able to express a teaspoon of pus coming from the dorsal foot. She had called her  podiatrist who prescribed Keflex however in response to advice from our office she took him to the emergency room. They apparently gave him IV antibiotics and discharged him on doxycycline. His white count was 14.5 with 92% neutrophils. He is not systemically unwell and does not really complain of a lot of pain 3/1; patient has not been here in about 7 weeks. In the interim he was hospitalized on 2 occasions from 01/23/2020 through 02/05/2020. The original admission was for cellulitis of the left leg he received IV antibiotics and Augmentin. It was noted that the right AKA stump broke down ultimately requiring a revision of the AKA by vein and vascular and he went to rehab medicine in hospital from 1/27 through 02/16/2020 He is back at home now. The only wound he has is on the lower coccyx everything else is closed. He is not complaining of any pain in the left leg although there is edema up into his mid thigh. He apparently has had his Lasix increased and this was felt to be secondary to congestive heart failure 3/22. The patient is followed here for a small wound on his lower sacrum/coccyx. He has been using silver collagen the wound is small with some depth. They asked me to look at a fairly large annular lesion on the left anterior lower leg this is not scaled or painful. It is not pruritic. The patient's wife tells me this just came up over the weekend past 4/12; patient still has a very tiny open area on the lower sacrum/coccyx. I changed him to Iodoflex last time or Iodosorb ointment unknown though there were making too much of a difference here. Since he was last here he fell suffered a laceration on his left medial lower leg that required suturing. He ended up getting admitted from 3/31 through 4/3 apparently for apparently atrial fibrillation. He was put on  amiodarone. He returned home he has not been able to tolerate the amiodarone and stopped that. Apparently he was on topical all that was discontinued as well because his blood pressure was too low. He has 3-4+ pitting edema in the left lower leg 4/19; the patient has been using Iodosorb gel to the sacral wound region and silver alginate to the trauma wound on his left leg. He continues to have stitches in place to the left leg wound. Per wife he is not able to offload on the sacral region very well. Electronic Signature(s) Signed: 04/29/2020 10:09:28 AM By: Kalman Shan DO Entered By: Kalman Shan on 04/29/2020 09:46:20 -------------------------------------------------------------------------------- Physical Exam Details Patient Name: Date of Service: DO RN, [name] 04/27/2020 2:45 PM Medical Record Number: [id number] Patient Account Number: [id number] Date of Birth/Sex: Treating RN: 05-11-1929 (87 y.o. [name] Primary Care Provider: [name] Other Clinician: Referring Provider: Treating Provider/Extender: Charlestine Massed in Treatment: 24 Constitutional respirations regular, non-labored and within target range for patient.Marland [facility] Psychiatric pleasant and cooperative. Notes Sacral wound: Small opening with mild erythema to the surrounding skin. Left medial lower leg: Laceration with sutures in place. The wound margins are not well-healed and appear to open easily. 2+ pitting edema Electronic Signature(s) Signed: 04/29/2020 10:09:28 AM By: Kalman Shan DO Entered By: Kalman Shan on 04/29/2020 09:49:06 -------------------------------------------------------------------------------- Physician Orders Details Patient Name: Date of Service: DO RN, [name] 04/27/2020 2:45 PM Medical Record Number: [id number] Patient Account Number: [id number] Date of Birth/Sex: Treating RN: 1929-07-19 (87 y.o. 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 - on Monday with Dr. Burney Gauze Return A Bathing/ Shower/ Hygiene May shower and wash wound with soap and water. - with dressing changes only. Edema Control - Lymphedema / SCD / Other Elevate legs to the level of the heart or above for 30 minutes daily and/or when sitting, a frequency of: - throughout the day. Off-Loading Turn and reposition every 2 hours [place] wound care orders this week; continue Home Health for wound care. May utilize formulary equivalent dressing for wound treatment orders unless otherwise specified. - [facility]. Wound Treatment Wound #15 - Sacrum Cleanser: Wound Cleanser [facility]) Every Other Day/30 Days Discharge Instructions: Cleanse the wound with wound cleanser prior to applying a clean dressing using gauze sponges, not tissue or cotton balls. Peri-Wound Care: Skin Prep [facility]) Every Other Day/30 Days Discharge Instructions: Use skin prep as directed Prim Dressing: Promogran Prisma Matrix, 4.34 (sq in) (silver collagen) (Home Health) Every Other Day/30 Days ary Discharge Instructions: Moisten collagen with saline or hydrogel Secondary Dressing: ComfortFoam Border, 3x3 in (silicone border) [facility]) Every Other Day/30 Days Discharge Instructions: Apply over primary dressing as directed. Wound #18 - Lower Leg Wound Laterality: Left, Medial Cleanser: Soap and Water [facility]) Every Other Day/30 Days Discharge Instructions: May shower and wash wound with dial antibacterial soap and water prior to dressing  change. Cleanser: Wound Cleanser [facility]) Every Other Day/30 Days Discharge Instructions: Cleanse the wound with wound cleanser prior to applying a clean dressing using gauze sponges, not tissue or cotton balls. Cleanser: Wound Cleanser [facility]) Every Other Day/30 Days Discharge Instructions: Cleanse the wound with wound cleanser prior to applying a clean dressing using gauze sponges, not tissue or cotton balls. Peri-Wound Care: Triamcinolone 15 (g) (Home Health) Every Other Day/30 Days Discharge Instructions: Use triamcinolone 15 (g) as directed Peri-Wound Care: Sween Lotion (Moisturizing lotion) (Home Health) Every Other Day/30 Days Discharge Instructions: Apply moisturizing lotion as directed Peri-Wound Care: Skin Prep (Home Health) Every Other Day/30 Days Discharge Instructions: Use skin prep as directed Prim Dressing: Promogran Prisma Matrix, 4.34 (sq in) (silver collagen) (Home Health) Every Other Day/30 Days ary Discharge Instructions: Moisten collagen with saline or hydrogel Secondary Dressing: Woven Gauze Sponge, Non-Sterile 4x4 in [facility]) Every Other Day/30 Days Discharge Instructions: Apply over primary dressing as directed. Secondary Dressing: ABD Pad, 5x9 [facility]) Every Other Day/30 Days Discharge Instructions: Apply over primary dressing as directed. Compression Wrap: Kerlix Roll 4.5x3.1 (in/yd) (Home Health) Every Other Day/30 Days Discharge Instructions: erlix/coban wrap should be done very lightl Compression Wrap: Coban Self-Adherent Wrap 4x5 (in/yd) (Home Health) Every Other Day/30 Days Discharge Instructions: kerlix/coban wrap should be done very lightl Electronic Signature(s) Signed: 04/29/2020 10:09:28 AM By: Kalman Shan DO Entered By: Kalman Shan on 04/29/2020 09:49:30 -------------------------------------------------------------------------------- Problem List Details Patient Name: Date of Service: DO RN, [name] 04/27/2020 2:45  PM Medical Record Number: [id number] Patient Account Number: [id number] Date of Birth/Sex: Treating RN: 01/28/1929 (87 y.o. 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 11/11/2019 No Yes Inactive Problems ICD-10 Code Description Active Date Inactive Date L89.623 Pressure ulcer of left heel, stage 3 11/11/2019 11/11/2019 L03.116 Cellulitis of left lower limb 01/20/2020 01/20/2020 Resolved Problems ICD-10 Code Description Active Date Resolved Date S40.022D Contusion of left upper arm, subsequent encounter 01/06/2020 01/06/2020 Electronic Signature(s) Signed: 04/29/2020 10:09:28 AM By: Kalman Shan DO Entered By: Kalman Shan on 04/29/2020 09:50:41 -------------------------------------------------------------------------------- Progress Note Details Patient Name: Date of Service: DO RN, [name] 04/27/2020 2:45 PM Medical Record Number: [id number] Patient Account Number: [id number] Date of Birth/Sex: Treating RN: July 21, 1929 (87 y.o. [name], [name] Primary Care Provider: [name] Other Clinician: Referring Provider: Treating Provider/Extender: Charlestine Massed in Treatment: 24 Subjective Chief Complaint Information obtained from Patient Right forearm skin tear 04/21/2019; patient is here for review of wound on the right medial great toe 06/23/2019: patient has new skin tears to the right forearm and left elbow 11/11/2019; patient is here for a wound on the left lateral heel and sacrum 04/29/2020; sacral wound and left medial lower leg trauma  wound History of Present Illness (HPI) 01/30/18 on evaluation today patient presents for initial inspection concerning the skin tear of the right forearm. Fortunately there does not appear to be any evidence of infection and this occurred approximately three days ago. He has been using Vaseline over the region. With that being said the patient does have a history of cataracts COPD, hypertension, peripheral arterial disease, and osteoarthritis. He tells me that he does have a little bit of pain at the site but nothing too significant at this time which is good news. Fortunately this overall appears to be doing fairly well which is good news. No fevers, chills, nausea, or vomiting noted at this time. The biggest issue at this point is that the wound is of quite significant size. READMISSION 04/21/2019 This is a 87 year old man with multiple medical problems who was seen once in the clinic here in January 2020 with a skin tear on his right forearm seen by [name]. This problem this time started sometime recently. He was noted by podiatry to have erosions and wounds of his right foot on toes 1-3. He also saw [name] of dermatology who gave him Silvadene cream for what I think was felt to be a stasis dermatitis of his bilateral lower legs. At some point he was referred to [name]. He was also noted to have previous noninvasive arterial studies that showed no flow to either one of his toes and noncompressible ABIs bilaterally. [name] did noninvasive arterial studies on him. These showed that he had greater than 75% stenosis in the proximal and mid common femoral artery. Monophasic flow in the posterior tibial and dorsalis pedis positions on the right foot. ABIs again were noncompressible. He was felt to have total occlusion of the superficial femoral artery on the right. The overall interpretation is that he had severe multilevel arterial occlusive disease. Absent femoral pulses and superficial  femoral artery occlusions. In spite of this he was not felt to be candidate for arteriography given his bilateral femoral artery occlusions as well he would not be a candidate for endovascular approach secondary to his common femoral artery occlusions and proximal iliac disease. He was not felt to be a candidate for an open infra inguinal bypass. He therefore felt  he would need to be monitored over time with the only option being a right below-knee amputation The patient complains currently of pain at night when he is up in bed. The pain in the right foot is better when he puts the leg down over the bed side. This is compatible with rest pain. He has very limited activity i.e. is even exhausted getting dressed in the morning because of cardiopulmonary issues therefore it is difficult to gauge claudication I believe he was referred here by Dr. [name] with one of the major issues is whether he would be a candidate for hyperbaric oxygen. He is not a diabetic. Dr. [name] did give him Silvadene cream for dry flaking skin in his bilateral lower extremities, however the patient is concerned about using this in the face of sulfa allergy. 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 This is on the medial aspect extended there is a second area on the plantar aspect. Most of what the patient talks about is claudication with minimal activity [however the patient is limited by his COPD], or even at rest at night. He is not getting a lot of sleep. We use silver collagen on this wound 5/4; the patient has a wound on the tip of his right great toe also on the medial aspect. Setting of severe  nonrevascularizable PAD [see description in HPI]. He does not describe any change in his pain. We have been using silver collagen 5/18; 2-week follow-up. The patient has wound on the tip of his right great toe as well as the medial aspect. He has developed new wounds x2 on the right lateral foot and the right lateral calcaneus. These look like ischemic areas. The patient talks about claudication at night. He seems to be more comfortable during the day. We have been using silver collagen to the wounds. I have previously debrided the eschar on the toes however this comes right back. 6/1; 2-week follow-up. This patient has ischemic wounds on his right foot in the setting of nonunreconstructable PAD [previously reviewed by Dr. Dixon]. He does not really describe severe pain but he does have episodic pain in the right great toe. He has an area on the tip of the right great toe as well as the medial aspect of the right great toe. A new area on the tip of the right fourth toe. He has the area on the right lateral foot fortunately the area on the right lateral heel appears to have epithelialized over. We are using silver collagen to all wounds 06/23/2019 upon evaluation today patient appears to be doing about the same in regard to his foot ulcers. Unfortunately he has 2 new skin tears on his left elbow and the right forearm that were new as of today. He states that it was doing okay until they went to change the dressing and they put some collagen on it that got stuck and somewhat pulled back the skin unfortunately. Fortunately there is no signs of active infection at this time. No fevers, chills, nausea, vomiting, or diarrhea. 6/29; the patient was hospitalized from 6/18 through 6/23 going in with increasing drainage from the wounds on his right foot. He underwent a angiogram on  06/30/2019 by Dr. Donzetta Matters which revealed multilevel arterial disease including occlusion of the right common femoral artery. He  reconstitutes the profunda femoris and the below-knee popliteal artery and appears to have posterior tibial peroneal runoff to the ankle although there is heavy calcification of the popliteal below the knee. He was offered consideration of a right above-knee amputation, palliative and/or hospice care and consideration of a very high risk attempt at bypass. He did receive IV antibiotics. He comes in the clinic today again with the 2 areas that are necrotic on the left great toe and a small punched out area on the right lateral foot. A lot of the erythema on top of the right foot but no warmth. He has a lot of swelling here. The patient is literally tortured over this decision that he needs to make. I went over this with him in exceptional detail. I do not believe he is a candidate for any form of open bypass surgery. I think his vascular doctors would agree with that. He still has pain at night when he puts his leg up on the bed that is relieved by putting his leg dependent. This is not different from what I remember. He is not septic there is no additional skin breakdown that I can see 7/8 the patient continues to have severe pain in the right foot at night making it difficult for him to rest. There is increasing erythema in the right foot but the foot is cold. I do not think this is an infection I think this is likely to be tissue necrosis from ischemia. I thought he would have made an up decision today about going forward with hospice he has not. He did not go to see [name] on 7/6. Again he wants to talk to me at length about amputations. I do not think he will do well after an above-knee amputation on the right. Furthermore I do not think he is a candidate for bypass surgery on the right leg 7/20; patient still having a lot of pain in the right foot. He saw Dr. Donzetta Matters on 7/16. He is not felt to be able to undergo a complex revascularization and I certainly agree with that. He was offered a right  below-knee amputation but he has not made that decision. I have repetitively suggested hospice care if he will not undergo an amputation he is not decided he wants to go that route either. He arrives in the clinic today accompanied by his son who is a Stage manager and his wife. I think the patient has tissue breakdown in the dorsal foot secondary to ischemia although his son brought up that some of the erythema could be a contact dermatitis as they were apparently layering a large area of pure call over the wound area. I was not aware of this not supposed to be doing it in this fashion. In any case the erythema look better than last week and I am quite convinced this is not cellulitis READMISSION 11/11/2019 Mr. Paula Libra is now an 87 year old man who we had in this clinic from [place] through July/21 largely as a result of ischemic wounds in his right foot. He was followed by vein and vascular [name]. His anatomy was not felt to be amenable to a revascularization. The patient had increasing ischemic pain but was very resistant to the notion of an amputation. Eventually he did go through with this on August 22 he will underwent an AKA by [name]. He went to  rehabilitation. There he developed a small stage III lower sacral pressure ulcer and an area on his left lateral heel which is also probably a pressure ulcer. They have been using silver collagen on both wound areas. The patient states he is not in any unrelieved pain. He is in a wheelchair at home with a Roho cushion. He is able to do his own transfers. I think he is probably putting far too much pressure on these areas Past medical history is reviewed. He has a history of CHF, severe PAD, hypertension, coronary artery disease status post CABG, COPD Gold stage IV on O2, hypothyroidism and a recent problem with urinary retention he now has a Foley catheter. He has had problems with left leg swelling and adjustment of his diuretics he is followed by  Dr. Daneen Schick. The patient has severe PAD. On 06/30/2019 he had an angiogram on the remaining left side he had heavily diseased femoral artery. His superficial femoral artery was flush occluded with runoff in the profunda. He was felt his SFA was likely occluded and reconstitutes the popliteal artery via runoff via the peroneal and posterior tibial artery. As noted he is not in a lot of pain. His last noninvasive study was on 6/20. On the left side this was noncompressible ABI but a TBI of only 0.20 with dampened monophasic waveforms 11/25/2019. Patient has 2 wound areas 1 on the left lateral heel and a ulcer on his coccyx. These are probably all pressure related although he has significant PAD complicating any attempt to heal a wound on the left heel. We have been using silver collagen on the wound and the coccyx silver alginate on the heel. 11/30; miraculously the left lateral heel wound has healed. The area on his coccyx is not much better. We have been using Prisma He apparently went back to see [name]. His wife paraphrasing says that he had terrible blood flow in the left lower extremity but he is not a candidate for revascularization. Fortunately had enough blood flow to heal the wound I will try to look at Dr. Mee Hives note by the time these next year 12/14; the area on his left lateral heel remains closed ooThe area on his coccyx is about the same in terms of surface area and depth minimal debris however but it is still adherent. We have been using Iodoflex. ooThey stop me before I left the room to show me his right AKA stump. He tells me he is having a lot of pain in this area that is episodic it almost looks like there is bruising in this area but the bruising looks old I asked him about trauma but they were not aware of any. They see vascular tomorrow 12/28; the area on the coccyx looks absolutely no better. Completely lifeless surface of fibrinous debris and surrounding raised skin. We  have been using Iodoflex this is simply not working. ooHe had a fall when he got up forgetting that he was missing his right leg he has skin tears on the right humerus area just above the elbow and on the right forearm. 1/11; the area on the coccyx is about the same we have been using Santyl although the surface of this is no better. Small punched out wound. He also has skin tears on his arm which are drying up gradually. His wife had called urgently yesterday to be seen. Apparently he developed erythema and swelling of his left foot over the weekend and she was able to express  a teaspoon of pus coming from the dorsal foot. She had called her podiatrist who prescribed Keflex however in response to advice from our office she took him to the emergency room. They apparently gave him IV antibiotics and discharged him on doxycycline. His white count was 14.5 with 92% neutrophils. He is not systemically unwell and does not really complain of a lot of pain 3/1; patient has not been here in about 7 weeks. In the interim he was hospitalized on 2 occasions from 01/23/2020 through 02/05/2020. The original admission was for cellulitis of the left leg he received IV antibiotics and Augmentin. It was noted that the right AKA stump broke down ultimately requiring a revision of the AKA by vein and vascular and he went to rehab medicine in hospital from 1/27 through 02/16/2020 He is back at home now. The only wound he has is on the lower coccyx everything else is closed. He is not complaining of any pain in the left leg although there is edema up into his mid thigh. He apparently has had his Lasix increased and this was felt to be secondary to congestive heart failure 3/22. The patient is followed here for a small wound on his lower sacrum/coccyx. He has been using silver collagen the wound is small with some depth. ooThey asked me to look at a fairly large annular lesion on the left anterior lower leg this is not  scaled or painful. It is not pruritic. The patient's wife tells me this just came up over the weekend past 4/12; patient still has a very tiny open area on the lower sacrum/coccyx. I changed him to Iodoflex last time or Iodosorb ointment unknown though there were making too much of a difference here. Since he was last here he fell suffered a laceration on his left medial lower leg that required suturing. He ended up getting admitted from 3/31 through 4/3 apparently for apparently atrial fibrillation. He was put on amiodarone. He returned home he has not been able to tolerate the amiodarone and stopped that. Apparently he was on topical all that was discontinued as well because his blood pressure was too low. He has 3-4+ pitting edema in the left lower leg 4/19; the patient has been using Iodosorb gel to the sacral wound region and silver alginate to the trauma wound on his left leg. He continues to have stitches in place to the left leg wound. Per wife he is not able to offload on the sacral region very well. Patient History Information obtained from Patient. Family History Cancer - Mother, Hypertension - Mother,Siblings, Stroke - Siblings, No family history of Diabetes, Heart Disease, Hereditary Spherocytosis, Kidney Disease, Lung Disease, Seizures, Thyroid Problems, Tuberculosis. Social History Former smoker - quit 1984 - ended on 01/09/1982, Marital Status - Married, Alcohol Use - Moderate, Drug Use - No History, Caffeine Use - Daily. Medical History Eyes Patient has history of Cataracts Denies history of Glaucoma, Optic Neuritis Ear/Nose/Mouth/Throat Denies history of Chronic sinus problems/congestion, Middle ear problems Hematologic/Lymphatic Denies history of Anemia, Hemophilia, Human Immunodeficiency Virus, Lymphedema, Sickle Cell Disease Respiratory Patient has history of Asthma, Chronic Obstructive Pulmonary Disease (COPD) Denies history of Aspiration, Pneumothorax, Sleep Apnea,  Tuberculosis Cardiovascular Patient has history of Congestive Heart Failure, Coronary Artery Disease, Hypertension, Peripheral Arterial Disease Denies history of Angina, Arrhythmia, Deep Vein Thrombosis, Hypotension, Peripheral Venous Disease, Phlebitis, Vasculitis Gastrointestinal Denies history of Cirrhosis , Colitis, Crohnoos, Hepatitis A, Hepatitis B, Hepatitis C Endocrine Denies history of Type I Diabetes, Type II  Diabetes Genitourinary Denies history of End Stage Renal Disease Immunological Denies history of Lupus Erythematosus, Raynaudoos, Scleroderma Integumentary (Skin) Denies history of History of Burn Musculoskeletal Patient has history of Osteoarthritis Denies history of Gout, Rheumatoid Arthritis, Osteomyelitis Neurologic Denies history of Dementia, Neuropathy, Quadriplegia, Paraplegia, Seizure Disorder Oncologic Denies history of Received Chemotherapy, Received Radiation Psychiatric Patient has history of Confinement Anxiety Denies history of Anorexia/bulimia Hospitalization/Surgery History - copd. - seizure. - CABG 4 vessels. - right AKA. Medical A Surgical History Notes nd Ear/Nose/Mouth/Throat chronic rhinitis Cardiovascular carotid artery occlusion, hyperlipidemia Gastrointestinal GERD, gallstones Endocrine hypothyroidism Genitourinary BPH, hx kidney stones, ureteral tumor, urinary retention, foley cath Oncologic squamous cell carcinoma of skin Objective Constitutional respirations regular, non-labored and within target range for patient.. Vitals Time Taken: 3:04 PM, Height: 69 in, Source: Stated, Temperature: 98.3 F, Pulse: 99 bpm, Respiratory Rate: 18 breaths/min, Blood Pressure: 146/76 mmHg. Psychiatric pleasant and cooperative. General Notes: Sacral wound: Small opening with mild erythema to the surrounding skin. Left medial lower leg: Laceration with sutures in place. The wound margins are not well-healed and appear to open easily. 2+ pitting  edema Integumentary (Hair, Skin) Wound #15 status is Open. Original cause of wound was Pressure Injury. The date acquired was: 08/01/2019. The wound has been in treatment 24 weeks. The wound is located on the Sacrum. The wound measures 0.4cm length x 0.2cm width x 0.2cm depth; 0.063cm^2 area and 0.013cm^3 volume. There is Fat Layer (Subcutaneous Tissue) exposed. There is no tunneling noted, however, there is undermining starting at 12:00 and ending at 12:00 with a maximum distance of 0.2cm. There is a small amount of serous drainage noted. The wound margin is epibole. There is medium (34-66%) pink, pale granulation within the wound bed. There is a medium (34-66%) amount of necrotic tissue within the wound bed including Adherent Slough. Wound #18 status is Open. Original cause of wound was Trauma. The date acquired was: 04/08/2020. The wound has been in treatment 1 weeks. The wound is located on the Left,Medial Lower Leg. The wound measures 3cm length x 0.9cm width x 0.3cm depth; 2.121cm^2 area and 0.636cm^3 volume. There is Fat Layer (Subcutaneous Tissue) exposed. There is no tunneling or undermining noted. There is a medium amount of serosanguineous drainage noted. The wound margin is flat and intact. There is no granulation within the wound bed. There is a large (67-100%) amount of necrotic tissue within the wound bed including Eschar and Adherent Slough. Assessment Active Problems ICD-10 Pressure ulcer of sacral region, stage 3 Unspecified open wound, left lower leg, subsequent encounter Acquired absence of right leg above knee The sacral wound is overall stable in size. He is using iodoform gel and I would like to switch this to collagen as there is more skin breakdown present. We also discussed the importance of aggressive offloading to this area as it is over a bony prominence. His left leg laceration continues to have sutures in place. These have been in place for 3 weeks. There are no signs  of infection. I removed 4 Sutures from the most proximal end where it appeared there was healing. I left the others in place and will reassess the next time I see him in 1 week as I think the wound would dehisce if all were taken out. We will use silver collagen to this area as well. Plan Follow-up Appointments: Return Appointment in 1 week. - on Monday with Dr. Burney Gauze Bathing/ Shower/ Hygiene: May shower and wash wound with soap and water. - with dressing  changes only. Edema Control - Lymphedema / SCD / Other: Elevate legs to the level of the heart or above for 30 minutes daily and/or when sitting, a frequency of: - throughout the day. Off-Loading: Turn and reposition every 2 hours Home Health: New wound care orders this week; continue Home Health for wound care. May utilize formulary equivalent dressing for wound treatment orders unless otherwise specified. - [facility]. WOUND #15: - Sacrum Wound Laterality: Cleanser: Wound Cleanser (Home Health) Every Other Day/30 Days Discharge Instructions: Cleanse the wound with wound cleanser prior to applying a clean dressing using gauze sponges, not tissue or cotton balls. Peri-Wound Care: Skin Prep [facility]) Every Other Day/30 Days Discharge Instructions: Use skin prep as directed Prim Dressing: Promogran Prisma Matrix, 4.34 (sq in) (silver collagen) (Home Health) Every Other Day/30 Days ary Discharge Instructions: Moisten collagen with saline or hydrogel Secondary Dressing: ComfortFoam Border, 3x3 in (silicone border) [facility]) Every Other Day/30 Days Discharge Instructions: Apply over primary dressing as directed. WOUND #18: - Lower Leg Wound Laterality: Left, Medial Cleanser: Soap and Water [facility]) Every Other Day/30 Days Discharge Instructions: May shower and wash wound with dial antibacterial soap and water prior to dressing change. Cleanser: Wound Cleanser [facility]) Every Other Day/30 Days Discharge  Instructions: Cleanse the wound with wound cleanser prior to applying a clean dressing using gauze sponges, not tissue or cotton balls. Cleanser: Wound Cleanser [facility]) Every Other Day/30 Days Discharge Instructions: Cleanse the wound with wound cleanser prior to applying a clean dressing using gauze sponges, not tissue or cotton balls. Peri-Wound Care: Triamcinolone 15 (g) (Home Health) Every Other Day/30 Days Discharge Instructions: Use triamcinolone 15 (g) as directed Peri-Wound Care: Sween Lotion (Moisturizing lotion) (Home Health) Every Other Day/30 Days Discharge Instructions: Apply moisturizing lotion as directed Peri-Wound Care: Skin Prep (Home Health) Every Other Day/30 Days Discharge Instructions: Use skin prep as directed Prim Dressing: Promogran Prisma Matrix, 4.34 (sq in) (silver collagen) (Home Health) Every Other Day/30 Days ary Discharge Instructions: Moisten collagen with saline or hydrogel Secondary Dressing: Woven Gauze Sponge, Non-Sterile 4x4 in [facility] Every Other Day/30 Days Discharge Instructions: Apply over primary dressing as directed. Secondary Dressing: ABD Pad, 5x9 [facility]) Every Other Day/30 Days Discharge Instructions: Apply over primary dressing as directed. Com pression Wrap: Kerlix Roll 4.5x3.1 (in/yd) (Home Health) Every Other Day/30 Days Discharge Instructions: erlix/coban wrap should be done very lightl Com pression Wrap: Coban Self-Adherent Wrap 4x5 (in/yd) (Home Health) Every Other Day/30 Days Discharge Instructions: kerlix/coban wrap should be done very lightl 1. Silver collagen to the sacral ulcer and left leg laceration 2. Follow-up in 1 week 3. 4 sutures removed today Electronic Signature(s) Signed: 04/29/2020 10:09:28 AM By: Kalman Shan DO Entered By: Kalman Shan on 04/29/2020 10:07:51 -------------------------------------------------------------------------------- HxROS Details Patient Name: Date of Service: DO RN, DA  V ID L. 04/27/2020 2:45 PM Medical Record Number: [id number] Patient Account Number: [id number] Date of Birth/Sex: Treating RN: 11/22/1929 (87 y.o. [name] Primary Care Provider: [name] Other Clinician: Referring Provider: Treating Provider/Extender: Charlestine Massed in Treatment: 24 Information Obtained From Patient Eyes Medical History: Positive for: Cataracts Negative for: Glaucoma; Optic Neuritis Ear/Nose/Mouth/Throat Medical History: Negative for: Chronic sinus problems/congestion; Middle ear problems Past Medical History Notes: chronic rhinitis Hematologic/Lymphatic Medical History: Negative for: Anemia; Hemophilia; Human Immunodeficiency Virus; Lymphedema; Sickle Cell Disease Respiratory Medical History: Positive for: Asthma; Chronic Obstructive Pulmonary Disease (COPD) Negative for: Aspiration; Pneumothorax; Sleep Apnea; Tuberculosis Cardiovascular Medical History: Positive for:  Congestive Heart Failure; Coronary Artery Disease; Hypertension; Peripheral Arterial Disease Negative for: Angina; Arrhythmia; Deep Vein Thrombosis; Hypotension; Peripheral Venous Disease; Phlebitis; Vasculitis Past Medical History Notes: carotid artery occlusion, hyperlipidemia Gastrointestinal Medical History: Negative for: Cirrhosis ; Colitis; Crohns; Hepatitis A; Hepatitis B; Hepatitis C Past Medical History Notes: GERD, gallstones Endocrine Medical History: Negative for: Type I Diabetes; Type II Diabetes Past Medical History Notes: hypothyroidism Genitourinary Medical History: Negative for: End Stage Renal Disease Past Medical History Notes: BPH, hx kidney stones, ureteral tumor, urinary retention, foley cath Immunological Medical History: Negative for: Lupus Erythematosus; Raynauds; Scleroderma Integumentary (Skin) Medical History: Negative for: History of Burn Musculoskeletal Medical History: Positive for: Osteoarthritis Negative for:  Gout; Rheumatoid Arthritis; Osteomyelitis Neurologic Medical History: Negative for: Dementia; Neuropathy; Quadriplegia; Paraplegia; Seizure Disorder Oncologic Medical History: Negative for: Received Chemotherapy; Received Radiation Past Medical History Notes: squamous cell carcinoma of skin Psychiatric Medical History: Positive for: Confinement Anxiety Negative for: Anorexia/bulimia HBO Extended History Items Eyes: Cataracts Immunizations Pneumococcal Vaccine: Received Pneumococcal Vaccination: Yes Implantable Devices No devices added Hospitalization / Surgery History Type of Hospitalization/Surgery copd seizure CABG 4 vessels right AKA Family and Social History Cancer: Yes - Mother; Diabetes: No; Heart Disease: No; Hereditary Spherocytosis: No; Hypertension: Yes - Mother,Siblings; Kidney Disease: No; Lung Disease: No; Seizures: No; Stroke: Yes - Siblings; Thyroid Problems: No; Tuberculosis: No; Former smoker - quit 1984 - ended on 01/09/1982; Marital Status - Married; Alcohol Use: Moderate; Drug Use: No History; Caffeine Use: Daily; Financial Concerns: No; Food, Clothing or Shelter Needs: No; Support System Lacking: No; Transportation Concerns: No Electronic Signature(s) Signed: 04/29/2020 10:09:28 AM By: Kalman Shan DO Signed: 05/14/2020 3:14:58 PM By: Rhae Hammock RN Entered By: Kalman Shan on 04/29/2020 09:46:28 -------------------------------------------------------------------------------- SuperBill Details Patient Name: Date of Service: DO RN, [name] 04/27/2020 Medical Record Number: [id number] Patient Account Number: [id number] Date of Birth/Sex: Treating RN: 06-Feb-1929 (87 y.o. 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

## 2020-05-14 NOTE — Progress Notes (Signed)
[name], [name] ([id number]) Visit Report for 04/27/2020 Arrival Information Details Patient Name: Date of Service: DO RN, [place] V ID L. 04/27/2020 2:45 PM Medical Record Number: [id number] Patient Account Number: [id number] Date of Birth/Sex: Treating RN: 29-Jul-1929 (87 y.o. [name] Primary Care Kennadee Walthour: [name] Other Clinician: Referring Sequita Wise: Treating Renesme Kerrigan/Extender: Charlestine Massed in Treatment: 24 Visit Information History Since Last Visit Added or deleted any medications: Yes Patient Arrived: Wheel Chair Any new allergies or adverse reactions: No Arrival Time: 14:59 Had a fall or experienced change in No Accompanied By: spouse activities of daily living that may affect Transfer Assistance: None risk of falls: Patient Identification Verified: Yes Signs or symptoms of abuse/neglect since last visito No Secondary Verification Process Completed: Yes Hospitalized since last visit: No Patient Requires Transmission-Based Precautions: No Implantable device outside of the clinic excluding No Patient Has Alerts: Yes cellular tissue based products placed in the center Patient Alerts: L ABI N/C, TBI = .20 since last visit: Has Dressing in Place as Prescribed: Yes Has Compression in Place as Prescribed: Yes Pain Present Now: Yes Electronic Signature(s) Signed: 04/28/2020 6:20:01 PM By: Baruch Gouty RN, BSN Entered By: Baruch Gouty on 04/27/2020 15:03:59 -------------------------------------------------------------------------------- Clinic Level of Care Assessment Details Patient Name: Date of Service: DO RN, [name] 04/27/2020 2:45 PM Medical Record Number: [id number] Patient Account Number: [id number] Date of Birth/Sex: Treating RN: 1929/03/02 (87 y.o. [name], [name] Primary Care Natally Ribera: [name] Other Clinician: Referring Kirkland Figg: Treating Machi Whittaker/Extender: Charlestine Massed in Treatment:  24 Clinic Level of Care Assessment Items TOOL 4 Quantity Score X- 1 0 Use when only an EandM is performed on FOLLOW-UP visit ASSESSMENTS - Nursing Assessment / Reassessment X- 1 10 Reassessment of Co-morbidities (includes updates in patient status) X- 1 5 Reassessment of Adherence to Treatment Plan ASSESSMENTS - Wound and Skin A ssessment / Reassessment []  - 0 Simple Wound Assessment / Reassessment - one wound X- 2 5 Complex Wound Assessment / Reassessment - multiple wounds X- 1 10 Dermatologic / Skin Assessment (not related to wound area) ASSESSMENTS - Focused Assessment X- 1 5 Circumferential Edema Measurements - multi extremities X- 1 10 Nutritional Assessment / Counseling / Intervention []  - 0 Lower Extremity Assessment (monofilament, tuning fork, pulses) []  - 0 Peripheral Arterial Disease Assessment (using hand held doppler) ASSESSMENTS - Ostomy and/or Continence Assessment and Care []  - 0 Incontinence Assessment and Management []  - 0 Ostomy Care Assessment and Management (repouching, etc.) PROCESS - Coordination of Care []  - 0 Simple Patient / Family Education for ongoing care X- 1 20 Complex (extensive) Patient / Family Education for ongoing care X- 1 10 Staff obtains Programmer, systems, Records, T Results / Process Orders est []  - 0 Staff telephones HHA, Nursing Homes / Clarify orders / etc []  - 0 Routine Transfer to another Facility (non-emergent condition) []  - 0 Routine Hospital Admission (non-emergent condition) []  - 0 New Admissions / Biomedical engineer / Ordering NPWT Apligraf, etc. , []  - 0 Emergency Hospital Admission (emergent condition) []  - 0 Simple Discharge Coordination X- 1 15 Complex (extensive) Discharge Coordination PROCESS - Special Needs []  - 0 Pediatric / Minor Patient Management []  - 0 Isolation Patient Management []  - 0 Hearing / Language / Visual special needs []  - 0 Assessment of Community assistance (transportation, D/C  planning, etc.) []  - 0 Additional assistance / Altered mentation []  - 0 Support Surface(s) Assessment (bed, cushion, seat, etc.) INTERVENTIONS - Wound Cleansing / Measurement []  -  0 Simple Wound Cleansing - one wound X- 2 5 Complex Wound Cleansing - multiple wounds X- 1 5 Wound Imaging (photographs - any number of wounds) []  - 0 Wound Tracing (instead of photographs) []  - 0 Simple Wound Measurement - one wound X- 2 5 Complex Wound Measurement - multiple wounds INTERVENTIONS - Wound Dressings []  - 0 Small Wound Dressing one or multiple wounds X- 2 15 Medium Wound Dressing one or multiple wounds []  - 0 Large Wound Dressing one or multiple wounds []  - 0 Application of Medications - topical []  - 0 Application of Medications - injection INTERVENTIONS - Miscellaneous []  - 0 External ear exam []  - 0 Specimen Collection (cultures, biopsies, blood, body fluids, etc.) []  - 0 Specimen(s) / Culture(s) sent or taken to Lab for analysis []  - 0 Patient Transfer (multiple staff / Civil Service fast streamer / Similar devices) []  - 0 Simple Staple / Suture removal (25 or less) []  - 0 Complex Staple / Suture removal (26 or more) []  - 0 Hypo / Hyperglycemic Management (close monitor of Blood Glucose) []  - 0 Ankle / Brachial Index (ABI) - do not check if billed separately X- 1 5 Vital Signs Has the patient been seen at the hospital within the last three years: Yes Total Score: 155 Level Of Care: New/Established - Level 4 Electronic Signature(s) Signed: 05/14/2020 3:14:58 PM By: Rhae Hammock RN Entered By: Rhae Hammock on 04/27/2020 15:54:45 -------------------------------------------------------------------------------- Encounter Discharge Information Details Patient Name: Date of Service: DO RN, [name] 04/27/2020 2:45 PM Medical Record Number: [id number] Patient Account Number: [id number] Date of Birth/Sex: Treating RN: 01/31/1929 (87 y.o. [name] Primary Care Deborra Phegley:  [name] Other Clinician: Referring Lorely Bubb: Treating Sanya Kobrin/Extender: Charlestine Massed in Treatment: 24 Encounter Discharge Information Items Discharge Condition: Stable Ambulatory Status: Wheelchair Discharge Destination: Home Transportation: Private Auto Accompanied By: wife Schedule Follow-up Appointment: Yes Clinical Summary of Care: Provided on 04/27/2020 Form Type Recipient Paper Patient Patient Electronic Signature(s) Signed: 04/27/2020 4:53:05 PM By: [name] Entered By: [name] on 04/27/2020 16:53:05 -------------------------------------------------------------------------------- Lower Extremity Assessment Details Patient Name: Date of Service: DO RN, [name] 04/27/2020 2:45 PM Medical Record Number: [id number] Patient Account Number: [id number] Date of Birth/Sex: Treating RN: 1929-11-22 (87 y.o. [name] Primary Care Blondell Laperle: [name] Other Clinician: Referring Catriona Dillenbeck: Treating Adonai Helzer/Extender: Charlestine Massed in Treatment: 24 Edema Assessment Assessed: [Left: No] [Right: No] [Left: Edema] [Right: :] Calf Left: Right: Point of Measurement: From Medial Instep 28.3 cm Ankle Left: Right: Point of Measurement: From Medial Instep 26.2 cm Vascular Assessment Pulses: Dorsalis Pedis Palpable: [Left:No] Notes 3+ pitting edema left foot Electronic Signature(s) Signed: 04/28/2020 6:20:01 PM By: Baruch Gouty RN, BSN Entered By: Baruch Gouty on 04/27/2020 15:13:47 -------------------------------------------------------------------------------- Multi Wound Chart Details Patient Name: Date of Service: DO RN, [name] 04/27/2020 2:45 PM Medical Record Number: [id number] Patient Account Number: [id number] Date of Birth/Sex: Treating RN: [birth year] (87 y.o. [name], [name] Primary Care Calianne Larue: [name] Other Clinician: Referring Samik Balkcom: Treating Raymont Andreoni/Extender:  Charlestine Massed in Treatment: 24 Vital Signs Height(in): 30 Pulse(bpm): 99 Weight(lbs): Blood Pressure(mmHg): 146/76 Body Mass Index(BMI): Temperature(F): 98.3 Respiratory Rate(breaths/min): 18 Photos: [N/A:N/A] Sacrum Left, Medial Lower Leg N/A Wound Location: Pressure Injury Trauma N/A Wounding Event: Pressure Ulcer Trauma, Other N/A Primary Etiology: Cataracts, Asthma, Chronic Cataracts, Asthma, Chronic N/A Comorbid History: Obstructive Pulmonary Disease Obstructive Pulmonary Disease (COPD), Congestive Heart Failure, (COPD), Congestive Heart Failure, Coronary Artery Disease,  Coronary Artery Disease, Hypertension, Peripheral Arterial Hypertension, Peripheral Arterial Disease, Osteoarthritis, Confinement Disease, Osteoarthritis, Confinement Anxiety Anxiety 08/01/2019 04/08/2020 N/A Date Acquired: 24 1 N/A Weeks of Treatment: Open Open N/A Wound Status: 0.4x0.2x0.2 3x0.9x0.3 N/A Measurements L x W x D (cm) 0.063 2.121 N/A A (cm) : rea 0.013 0.636 N/A Volume (cm) : 71.40% 22.80% N/A % Reduction in A rea: 40.90% -131.30% N/A % Reduction in Volume: 12 Starting Position 1 (o'clock): 12 Ending Position 1 (o'clock): 0.2 Maximum Distance 1 (cm): Yes No N/A Undermining: Category/Stage III Full Thickness Without Exposed N/A Classification: Support Structures Small Medium N/A Exudate Amount: Serous Serosanguineous N/A Exudate Type: amber red, brown N/A Exudate Color: Epibole Flat and Intact N/A Wound Margin: Medium (34-66%) None Present (0%) N/A Granulation Amount: Pink, Pale N/A N/A Granulation Quality: Medium (34-66%) Large (67-100%) N/A Necrotic Amount: Adherent Slough Eschar, Adherent Slough N/A Necrotic Tissue: Fat Layer (Subcutaneous Tissue): Yes Fat Layer (Subcutaneous Tissue): Yes N/A Exposed Structures: Fascia: No Fascia: No Tendon: No Tendon: No Muscle: No Muscle: No Joint: No Joint: No Bone: No Bone: No Small  (1-33%) Small (1-33%) N/A Epithelialization: Treatment Notes Wound #15 (Sacrum) Cleanser Wound Cleanser Discharge Instruction: Cleanse the wound with wound cleanser prior to applying a clean dressing using gauze sponges, not tissue or cotton balls. Peri-Wound Care Skin Prep Discharge Instruction: Use skin prep as directed Topical Primary Dressing Promogran Prisma Matrix, 4.34 (sq in) (silver collagen) Discharge Instruction: Moisten collagen with saline or hydrogel Secondary Dressing ComfortFoam Border, 3x3 in (silicone border) Discharge Instruction: Apply over primary dressing as directed. Secured With Compression Wrap Compression Stockings Add-Ons Wound #18 (Lower Leg) Wound Laterality: Left, Medial Cleanser Soap and Water Discharge Instruction: May shower and wash wound with dial antibacterial soap and water prior to dressing change. Wound Cleanser Discharge Instruction: Cleanse the wound with wound cleanser prior to applying a clean dressing using gauze sponges, not tissue or cotton balls. Wound Cleanser Discharge Instruction: Cleanse the wound with wound cleanser prior to applying a clean dressing using gauze sponges, not tissue or cotton balls. Peri-Wound Care Triamcinolone 15 (g) Discharge Instruction: Use triamcinolone 15 (g) as directed Sween Lotion (Moisturizing lotion) Discharge Instruction: Apply moisturizing lotion as directed Skin Prep Discharge Instruction: Use skin prep as directed Topical Primary Dressing Promogran Prisma Matrix, 4.34 (sq in) (silver collagen) Discharge Instruction: Moisten collagen with saline or hydrogel Secondary Dressing Woven Gauze Sponge, Non-Sterile 4x4 in Discharge Instruction: Apply over primary dressing as directed. ABD Pad, 5x9 Discharge Instruction: Apply over primary dressing as directed. Secured With Compression Wrap Kerlix Roll 4.5x3.1 (in/yd) Discharge Instruction: erlix/coban wrap should be done very lightl Coban  Self-Adherent Wrap 4x5 (in/yd) Discharge Instruction: kerlix/coban wrap should be done very lightl Compression Stockings Add-Ons Electronic Signature(s) Signed: 04/29/2020 10:09:28 AM By: Kalman Shan DO Signed: 05/14/2020 3:14:58 PM By: Rhae Hammock RN Entered By: Kalman Shan on 04/29/2020 09:43:23 -------------------------------------------------------------------------------- Multi-Disciplinary Care Plan Details Patient Name: Date of Service: DO RN, [name] 04/27/2020 2:45 PM Medical Record Number: [id number] Patient Account Number: [id number] Date of Birth/Sex: Treating RN: 03-Jul-1929 (87 y.o. [name], [name] Primary Care Starlette Thurow: [name] Other Clinician: Referring Sedrick Tober: Treating Dashay Giesler/Extender: Charlestine Massed in Treatment: 24 Active Inactive Wound/Skin Impairment Nursing Diagnoses: Knowledge deficit related to ulceration/compromised skin integrity Goals: Patient/caregiver will verbalize understanding of skin care regimen Date Initiated: 11/11/2019 Target Resolution Date: 06/04/2020 Goal Status: Active Ulcer/skin breakdown will have a volume reduction of 30% by week 4 Date Initiated: 11/11/2019 Target Resolution Date: 06/04/2020 Goal  Status: Active Interventions: Assess patient/caregiver ability to obtain necessary supplies Assess patient/caregiver ability to perform ulcer/skin care regimen upon admission and as needed Assess ulceration(s) every visit Notes: Electronic Signature(s) Signed: 05/14/2020 3:14:58 PM By: Rhae Hammock RN Entered By: Rhae Hammock on 04/27/2020 15:38:38 -------------------------------------------------------------------------------- Pain Assessment Details Patient Name: Date of Service: DO RN, [name] 04/27/2020 2:45 PM Medical Record Number: [id number] Patient Account Number: [id number] Date of Birth/Sex: Treating RN: November 04, 1929 (87 y.o. [name] Primary Care Ivy Meriwether:  [name] Other Clinician: Referring Emmeline Winebarger: Treating Salem Mastrogiovanni/Extender: Charlestine Massed in Treatment: 24 Active Problems Location of Pain Severity and Description of Pain Patient Has Paino Yes Site Locations Pain Location: Generalized Pain With Dressing Change: Yes Duration of the Pain. Constant / Intermittento Intermittent Rate the pain. Current Pain Level: 0 Worst Pain Level: 7 Least Pain Level: 0 Character of Pain Describe the Pain: Cramping, Sharp Pain Management and Medication Current Pain Management: Medication: Yes Leg drop or elevation: Yes Is the Current Pain Management Adequate: Adequate How does your wound impact your activities of daily livingo Sleep: Yes Bathing: No Appetite: No Relationship With Others: No Bladder Continence: No Emotions: No Bowel Continence: No Work: No Toileting: No Drive: No Dressing: No Hobbies: No Notes reports intermittent pain top of left foot Electronic Signature(s) Signed: 04/28/2020 6:20:01 PM By: Baruch Gouty RN, BSN Entered By: Baruch Gouty on 04/27/2020 15:12:20 -------------------------------------------------------------------------------- Patient/Caregiver Education Details Patient Name: Date of Service: DO RN, [name] ID Carlean [name] 4/19/2022andnbsp2:45 PM Medical Record Number: [id number] Patient Account Number: [id number] Date of Birth/Gender: Treating RN: January 19, 1929 (87 y.o. [name] Primary Care Physician: [name] Other Clinician: Referring Physician: Treating Physician/Extender: Charlestine Massed in Treatment: 24 Education Assessment Education Provided To: Patient and Caregiver Education Topics Provided Basic Hygiene: Methods: Explain/Verbal Responses: State content correctly Electronic Signature(s) Signed: 05/14/2020 3:14:58 PM By: Rhae Hammock RN Entered By: Rhae Hammock on 04/27/2020  15:39:10 -------------------------------------------------------------------------------- Wound Assessment Details Patient Name: Date of Service: DO RN, [name] 04/27/2020 2:45 PM Medical Record Number: [id number] Patient Account Number: [id number] Date of Birth/Sex: Treating RN: [birth year] (87 y.o. [name] Primary Care Kirbie Stodghill: [name] Other Clinician: Referring Nicholas Ossa: Treating Fidelia Cathers/Extender: Charlestine Massed in Treatment: 24 Wound Status Wound Number: 15 Primary Pressure Ulcer Etiology: Wound Location: Sacrum Wound Open Wounding Event: Pressure Injury Status: Date Acquired: 08/01/2019 Comorbid Cataracts, Asthma, Chronic Obstructive Pulmonary Disease Weeks Of Treatment: 24 History: (COPD), Congestive Heart Failure, Coronary Artery Disease, Clustered Wound: No Hypertension, Peripheral Arterial Disease, Osteoarthritis, Confinement Anxiety Photos Wound Measurements Length: (cm) 0.4 Width: (cm) 0.2 Depth: (cm) 0.2 Area: (cm) 0.063 Volume: (cm) 0.013 % Reduction in Area: 71.4% % Reduction in Volume: 40.9% Epithelialization: Small (1-33%) Tunneling: No Undermining: Yes Starting Position (o'clock): 12 Ending Position (o'clock): 12 Maximum Distance: (cm) 0.2 Wound Description Classification: Category/Stage III Wound Margin: Epibole Exudate Amount: Small Exudate Type: Serous Exudate Color: amber Foul Odor After Cleansing: No Slough/Fibrino Yes Wound Bed Granulation Amount: Medium (34-66%) Exposed Structure Granulation Quality: Pink, Pale Fascia Exposed: No Necrotic Amount: Medium (34-66%) Fat Layer (Subcutaneous Tissue) Exposed: Yes Necrotic Quality: Adherent Slough Tendon Exposed: No Muscle Exposed: No Joint Exposed: No Bone Exposed: No Electronic Signature(s) Signed: 04/28/2020 10:33:47 AM By: [name] Signed: 04/28/2020 6:20:01 PM By: Baruch Gouty RN, BSN Entered By: [name] on 04/27/2020  17:02:51 -------------------------------------------------------------------------------- Wound Assessment Details Patient Name: Date of Service: DO RN, [name] 04/27/2020 2:45 PM Medical Record Number:  [id number] Patient Account Number: [id number] Date of Birth/Sex: Treating RN: 03/14/1929 (87 y.o. [name] Primary Care Alyssamarie Mounsey: [name] Other Clinician: Referring Damali Broadfoot: Treating Makaylah Oddo/Extender: Charlestine Massed in Treatment: 24 Wound Status Wound Number: 18 Primary Trauma, Other Etiology: Wound Location: Left, Medial Lower Leg Wound Open Wounding Event: Trauma Status: Date Acquired: 04/08/2020 Comorbid Cataracts, Asthma, Chronic Obstructive Pulmonary Disease Weeks Of Treatment: 1 History: (COPD), Congestive Heart Failure, Coronary Artery Disease, Clustered Wound: No Hypertension, Peripheral Arterial Disease, Osteoarthritis, Confinement Anxiety Photos Wound Measurements Length: (cm) 3 Width: (cm) 0.9 Depth: (cm) 0.3 Area: (cm) 2.121 Volume: (cm) 0.636 % Reduction in Area: 22.8% % Reduction in Volume: -131.3% Epithelialization: Small (1-33%) Tunneling: No Undermining: No Wound Description Classification: Full Thickness Without Exposed Support Structures Wound Margin: Flat and Intact Exudate Amount: Medium Exudate Type: Serosanguineous Exudate Color: red, brown Foul Odor After Cleansing: No Slough/Fibrino Yes Wound Bed Granulation Amount: None Present (0%) Exposed Structure Necrotic Amount: Large (67-100%) Fascia Exposed: No Necrotic Quality: Eschar, Adherent Slough Fat Layer (Subcutaneous Tissue) Exposed: Yes Tendon Exposed: No Muscle Exposed: No Joint Exposed: No Bone Exposed: No Electronic Signature(s) Signed: 04/28/2020 10:33:47 AM By: [name] Signed: 04/28/2020 6:20:01 PM By: Baruch Gouty RN, BSN Entered By: [name] on 04/27/2020  17:02:23 -------------------------------------------------------------------------------- [place] Details Patient Name: Date of Service: DO RN, [name] 04/27/2020 2:45 PM Medical Record Number: [id number] Patient Account Number: [id number] Date of Birth/Sex: Treating RN: 12-25-1929 (87 y.o. [name] Primary Care Anaya Bovee: [name] Other Clinician: Referring Yona Kosek: Treating Meryl Hubers/Extender: Charlestine Massed in Treatment: 24 Vital Signs Time Taken: 15:04 Temperature (F): 98.3 Height (in): 69 Pulse (bpm): 99 Source: Stated Respiratory Rate (breaths/min): 18 Blood Pressure (mmHg): 146/76 Reference Range: 80 - 120 mg / dl Electronic Signature(s) Signed: 04/28/2020 6:20:01 PM By: Baruch Gouty RN, BSN Entered By: Baruch Gouty on 04/27/2020 15:09:11

## 2020-05-16 ENCOUNTER — Other Ambulatory Visit: Payer: Self-pay | Admitting: Internal Medicine

## 2020-05-18 DIAGNOSIS — I739 Peripheral vascular disease, unspecified: Secondary | ICD-10-CM | POA: Diagnosis not present

## 2020-05-18 DIAGNOSIS — J9611 Chronic respiratory failure with hypoxia: Secondary | ICD-10-CM | POA: Diagnosis not present

## 2020-05-18 DIAGNOSIS — I5032 Chronic diastolic (congestive) heart failure: Secondary | ICD-10-CM | POA: Diagnosis not present

## 2020-05-18 DIAGNOSIS — Z4781 Encounter for orthopedic aftercare following surgical amputation: Secondary | ICD-10-CM | POA: Diagnosis not present

## 2020-05-18 DIAGNOSIS — L89152 Pressure ulcer of sacral region, stage 2: Secondary | ICD-10-CM | POA: Diagnosis not present

## 2020-05-18 DIAGNOSIS — I110 Hypertensive heart disease with heart failure: Secondary | ICD-10-CM | POA: Diagnosis not present

## 2020-05-19 DIAGNOSIS — R338 Other retention of urine: Secondary | ICD-10-CM | POA: Diagnosis not present

## 2020-05-20 DIAGNOSIS — H353131 Nonexudative age-related macular degeneration, bilateral, early dry stage: Secondary | ICD-10-CM | POA: Diagnosis not present

## 2020-05-20 DIAGNOSIS — H40053 Ocular hypertension, bilateral: Secondary | ICD-10-CM | POA: Diagnosis not present

## 2020-05-20 DIAGNOSIS — H1045 Other chronic allergic conjunctivitis: Secondary | ICD-10-CM | POA: Diagnosis not present

## 2020-05-20 DIAGNOSIS — Z961 Presence of intraocular lens: Secondary | ICD-10-CM | POA: Diagnosis not present

## 2020-05-20 DIAGNOSIS — H17823 Peripheral opacity of cornea, bilateral: Secondary | ICD-10-CM | POA: Diagnosis not present

## 2020-05-20 DIAGNOSIS — H35373 Puckering of macula, bilateral: Secondary | ICD-10-CM | POA: Diagnosis not present

## 2020-05-21 DIAGNOSIS — L89152 Pressure ulcer of sacral region, stage 2: Secondary | ICD-10-CM | POA: Diagnosis not present

## 2020-05-21 DIAGNOSIS — I739 Peripheral vascular disease, unspecified: Secondary | ICD-10-CM | POA: Diagnosis not present

## 2020-05-21 DIAGNOSIS — I5032 Chronic diastolic (congestive) heart failure: Secondary | ICD-10-CM | POA: Diagnosis not present

## 2020-05-21 DIAGNOSIS — Z4781 Encounter for orthopedic aftercare following surgical amputation: Secondary | ICD-10-CM | POA: Diagnosis not present

## 2020-05-21 DIAGNOSIS — I110 Hypertensive heart disease with heart failure: Secondary | ICD-10-CM | POA: Diagnosis not present

## 2020-05-21 DIAGNOSIS — J9611 Chronic respiratory failure with hypoxia: Secondary | ICD-10-CM | POA: Diagnosis not present

## 2020-05-24 ENCOUNTER — Encounter (HOSPITAL_BASED_OUTPATIENT_CLINIC_OR_DEPARTMENT_OTHER): Payer: Medicare Other | Admitting: Internal Medicine

## 2020-05-24 ENCOUNTER — Other Ambulatory Visit: Payer: Self-pay

## 2020-05-24 DIAGNOSIS — Z89611 Acquired absence of right leg above knee: Secondary | ICD-10-CM | POA: Diagnosis not present

## 2020-05-24 DIAGNOSIS — I739 Peripheral vascular disease, unspecified: Secondary | ICD-10-CM | POA: Diagnosis not present

## 2020-05-24 DIAGNOSIS — L97822 Non-pressure chronic ulcer of other part of left lower leg with fat layer exposed: Secondary | ICD-10-CM | POA: Diagnosis not present

## 2020-05-24 DIAGNOSIS — S81802D Unspecified open wound, left lower leg, subsequent encounter: Secondary | ICD-10-CM | POA: Diagnosis not present

## 2020-05-24 DIAGNOSIS — L98491 Non-pressure chronic ulcer of skin of other sites limited to breakdown of skin: Secondary | ICD-10-CM | POA: Diagnosis not present

## 2020-05-24 DIAGNOSIS — L89153 Pressure ulcer of sacral region, stage 3: Secondary | ICD-10-CM | POA: Diagnosis not present

## 2020-05-24 DIAGNOSIS — Z951 Presence of aortocoronary bypass graft: Secondary | ICD-10-CM | POA: Diagnosis not present

## 2020-05-26 NOTE — Progress Notes (Signed)
[name], [name] ([id number]) Visit Report for 05/24/2020 Arrival Information Details Patient Name: Date of Service: DO RN, [place] V ID L. 05/24/2020 2:30 PM Medical Record Number: [id number] Patient Account Number: [id number] Date of Birth/Sex: Treating RN: 1929-01-20 (87 y.o. [name], [name] Primary Care Casondra Gasca: [name] Other Clinician: Referring Jackston O[place]: Treating Fulton Merry/Extender: [name] in Treatment: 27 Visit Information History Since Last Visit Added or deleted any medications: No Patient Arrived: Wheel Chair Any new allergies or adverse reactions: No Arrival Time: 14:46 Had a fall or experienced change in No Accompanied By: wife activities of daily living that may affect Transfer Assistance: None risk of falls: Patient Identification Verified: Yes Signs or symptoms of abuse/neglect since last visito No Secondary Verification Process Completed: Yes Hospitalized since last visit: No Patient Requires Transmission-Based Precautions: No Implantable device outside of the clinic excluding No Patient Has Alerts: Yes cellular tissue based products placed in the center Patient Alerts: L ABI N/C, TBI = .20 since last visit: Has Dressing in Place as Prescribed: Yes Pain Present Now: Yes Electronic Signature(s) Signed: 05/24/2020 6:08:57 PM By: Rhae Hammock RN Entered By: Rhae Hammock on 05/24/2020 14:46:26 -------------------------------------------------------------------------------- Encounter Discharge Information Details Patient Name: Date of Service: DO RN, Travis V ID L. 05/24/2020 2:30 PM Medical Record Number: [id number] Patient Account Number: [id number] Date of Birth/Sex: Treating RN: 08-23-1929 (87 y.o. [name] Primary Care Gerry Heaphy: [name] Other Clinician: Referring Shemika Robbs: Treating Velma Hanna/Extender: [name] in Treatment: 27 Encounter Discharge Information Items Post Procedure  Vitals Discharge Condition: Stable Temperature (F): 97.4 Ambulatory Status: Wheelchair Pulse (bpm): 84 Discharge Destination: Home Respiratory Rate (breaths/min): 17 Transportation: Private Auto Blood Pressure (mmHg): 115/74 Accompanied By: wife Schedule Follow-up Appointment: Yes Clinical Summary of Care: Provided on 05/24/2020 Form Type Recipient Paper Patient Patient Electronic Signature(s) Signed: 05/24/2020 5:53:17 PM By: [name] Entered By: [name] on 05/24/2020 17:53:17 -------------------------------------------------------------------------------- Lower Extremity Assessment Details Patient Name: Date of Service: DO RN, Travis V ID L. 05/24/2020 2:30 PM Medical Record Number: [id number] Patient Account Number: [id number] Date of Birth/Sex: Treating RN: [birth year] (87 y.o. [name], [name] Primary Care Jadea Shiffer: [name] Other Clinician: Referring Derold Dorsch: Treating Aloise Copus/Extender: [name] in Treatment: 27 Edema Assessment Assessed: Shirlyn Goltz: Yes] [name]: No] Edema: [Left: Ye] [Right: s] Calf Left: Right: Point of Measurement: 9 cm From Medial Instep 24 cm Ankle Left: Right: Point of Measurement: 29 cm From Medial Instep 28 cm Vascular Assessment Pulses: Dorsalis Pedis Palpable: [Left:Yes] Posterior Tibial Palpable: [Left:Yes] Electronic Signature(s) Signed: 05/24/2020 6:08:57 PM By: Rhae Hammock RN Entered By: Rhae Hammock on 05/24/2020 14:57:21 -------------------------------------------------------------------------------- Multi Wound Chart Details Patient Name: Date of Service: DO RN, Travis V ID L. 05/24/2020 2:30 PM Medical Record Number: [id number] Patient Account Number: [id number] Date of Birth/Sex: Treating RN: 1929/07/24 (87 y.o. [name] Primary Care Monte Zinni: [name] Other Clinician: Referring [name]: Treating Zacari Radick/Extender: [name] in  Treatment: 27 Vital Signs Height(in): 27 Pulse(bpm): 60 Weight(lbs): Blood Pressure(mmHg): 115/74 Body Mass Index(BMI): Temperature(F): 97.4 Respiratory Rate(breaths/min): 79 Photos: [15:No Photos Sacrum] [18:No Photos Left, Medial Lower Leg] Wound Location: [15:Pressure Injury] [18:Trauma] Wounding Event: [15:Pressure Ulcer] [18:Trauma, Other] Primary Etiology: [15:Cataracts, Asthma, Chronic] [18:Cataracts, Asthma, Chronic] Comorbid History: [15:Obstructive Pulmonary Disease (COPD), Congestive Heart Failure, Coronary Artery Disease, Hypertension, Peripheral Arterial Disease, Osteoarthritis, ConfinementDisease, Osteoarthritis, Confinement Disease, Osteoarthritis, Confinement  Anxiety 08/01/2019] [18:Obstructive Pulmonary Disease (COPD), Congestive Heart Failure, Coronary Artery Disease, Hypertension, Peripheral Arterial Anxiety 04/08/2020] Date Acquired: [15:27] [18:4]  Weeks of Treatment: [15:Open] [18:Open] Wound Status: [15:0.2x0.2x0.4] [18:4x1.4x0.4] Measurements L x W x D (cm) [15:0.031] [18:4.398] A (cm) : rea [15:0.013] [18:1.759] Volume (cm) : [15:85.90%] [18:-60.00%] % Reduction in A [15:rea: 40.90%] [18:-539.60%] % Reduction in Volume: [15:12] [18:12] Starting Position 1 (o'clock): [15:12] [18:12] Ending Position 1 (o'clock): [15:0.6] [18:4] Maximum Distance 1 (cm): [15:Yes] [18:Yes] Undermining: [15:Category/Stage III] [18:Full Thickness Without Exposed] Classification: [15:Medium] [18:Support Structures Medium] Exudate A mount: [15:Serosanguineous] [18:Serosanguineous] Exudate Type: [15:red, brown] [18:red, brown] Exudate Color: [15:Well defined, not attached] [18:Distinct, outline attached] Wound Margin: [15:Large (67-100%)] [18:Medium (34-66%)] Granulation A mount: [15:Pink, Pale] [18:Red, Pink] Granulation Quality: [15:None Present (0%)] [18:Medium (34-66%)] Necrotic A mount: [15:Fat Layer (Subcutaneous Tissue): Yes Fat Layer (Subcutaneous Tissue): Yes Fascia:  No] Exposed Structures: [15:Fascia: No Tendon: No Muscle: No Joint: No Bone: No Small (1-33%)] [18:Fascia: No Tendon: No Muscle: No Joint: No Bone: No Small (1-33%)] Epithelialization: [15:N/A] [18:Debridement - Excisional] Debridement: Pre-procedure Verification/Time Out N/A [18:15:40] Taken: [15:N/A] [18:Subcutaneous, Slough] Tissue Debrided: [15:N/A] [18:Skin/Subcutaneous Tissue] Level: [15:N/A] [18:5.6] Debridement A (sq cm): [15:rea N/A] [18:Curette] Instrument: [15:N/A] [18:Moderate] Bleeding: [15:N/A] [18:Pressure] Hemostasis A chieved: [15:N/A] [18:0] Procedural Pain: [15:N/A] [18:0] Post Procedural Pain: [15:N/A] [18:Procedure was tolerated well] Debridement Treatment Response: [15:N/A] [18:4x1.4x0.4] Post Debridement Measurements L x W x D (cm) [15:N/A] [18:1.759] Post Debridement Volume: (cm) [15:N/A] [18:Debridement] Wound Number: 19 N/A N/A Photos: No Photos N/A N/A Nose N/A N/A Wound Location: Pressure Injury N/A N/A Wounding Event: Pressure Ulcer N/A N/A Primary Etiology: Cataracts, Asthma, Chronic N/A N/A Comorbid History: Obstructive Pulmonary Disease (COPD), Congestive Heart Failure, Coronary Artery Disease, Hypertension, Peripheral Arterial Disease, Osteoarthritis, Confinement Anxiety 04/20/2020 N/A N/A Date Acquired: 2 N/A N/A Weeks of Treatment: Open N/A N/A Wound Status: 0.5x0.2x0.1 N/A N/A Measurements L x W x D (cm) 0.079 N/A N/A A (cm) : rea 0.008 N/A N/A Volume (cm) : 44.00% N/A N/A % Reduction in A rea: 42.90% N/A N/A % Reduction in Volume: No N/A N/A Undermining: Category/Stage II N/A N/A Classification: Medium N/A N/A Exudate A mount: Serosanguineous N/A N/A Exudate Type: red, brown N/A N/A Exudate Color: Distinct, outline attached N/A N/A Wound Margin: Small (1-33%) N/A N/A Granulation A mount: Red, Pink N/A N/A Granulation Quality: Large (67-100%) N/A N/A Necrotic A mount: Fascia: No N/A N/A Exposed  Structures: Fat Layer (Subcutaneous Tissue): No Tendon: No Muscle: No Joint: No Bone: No Small (1-33%) N/A N/A Epithelialization: N/A N/A N/A Debridement: N/A N/A N/A Tissue Debrided: N/A N/A N/A Level: N/A N/A N/A Debridement A (sq cm): rea N/A N/A N/A Instrument: N/A N/A N/A Bleeding: N/A N/A N/A Hemostasis A chieved: N/A N/A N/A Procedural Pain: N/A N/A N/A Post Procedural Pain: N/A N/A N/A Debridement Treatment Response: N/A N/A N/A Post Debridement Measurements L x W x D (cm) N/A N/A N/A Post Debridement Volume: (cm) N/A N/A N/A Procedures Performed: Treatment Notes Electronic Signature(s) Signed: 05/25/2020 4:16:43 PM By: [name] MD Signed: 05/26/2020 6:30:22 PM By: Levan Hurst RN, BSN Entered By: [name] on 05/24/2020 15:51:48 -------------------------------------------------------------------------------- Multi-Disciplinary Care Plan Details Patient Name: Date of Service: DO RN, Travis V ID L. 05/24/2020 2:30 PM Medical Record Number: [id number] Patient Account Number: [id number] Date of Birth/Sex: Treating RN: October 29, 1929 (87 y.o. [name] Primary Care Belanna Manring: [name] Other Clinician: Referring Truth Wolaver: Treating Diar Berkel/Extender: [name] in Treatment: 27 Active Inactive Wound/Skin Impairment Nursing Diagnoses: Knowledge deficit related to ulceration/compromised skin integrity Goals: Patient/caregiver will verbalize understanding of skin care regimen Date Initiated: 11/11/2019 Target Resolution  Date: 06/04/2020 Goal Status: Active Ulcer/skin breakdown will have a volume reduction of 30% by week 4 Date Initiated: 11/11/2019 Target Resolution Date: 06/04/2020 Goal Status: Active Interventions: Assess patient/caregiver ability to obtain necessary supplies Assess patient/caregiver ability to perform ulcer/skin care regimen upon admission and as needed Assess ulceration(s) every  visit Notes: Electronic Signature(s) Signed: 05/26/2020 6:30:22 PM By: Levan Hurst RN, BSN Entered By: Levan Hurst on 05/24/2020 17:20:23 -------------------------------------------------------------------------------- Pain Assessment Details Patient Name: Date of Service: DO RN, Travis V ID L. 05/24/2020 2:30 PM Medical Record Number: [id number] Patient Account Number: [id number] Date of Birth/Sex: Treating RN: April 24, 1929 (87 y.o. [name] Primary Care Emmabelle Fear: [name] Other Clinician: Referring Evalie Hargraves: Treating Stana Bayon/Extender: [name] in Treatment: 27 Active Problems Location of Pain Severity and Description of Pain Patient Has Paino Yes Site Locations Pain Location: Pain in Ulcers With Dressing Change: Yes Duration of the Pain. Constant / Intermittento Intermittent Rate the pain. Current Pain Level: 6 Worst Pain Level: 10 Least Pain Level: 0 Tolerable Pain Level: 6 Character of Pain Describe the Pain: Aching Pain Management and Medication Current Pain Management: Medication: Yes Cold Application: No Rest: Yes Massage: No Activity: No T.E.N.S.: No Heat Application: No Leg drop or elevation: No Is the Current Pain Management Adequate: Adequate How does your wound impact your activities of daily livingo Sleep: No Bathing: No Appetite: No Relationship With Others: No Bladder Continence: No Emotions: No Bowel Continence: No Work: No Toileting: No Drive: No Dressing: No Hobbies: No Electronic Signature(s) Signed: 05/24/2020 6:08:57 PM By: Rhae Hammock RN Entered By: Rhae Hammock on 05/24/2020 14:49:47 -------------------------------------------------------------------------------- Patient/Caregiver Education Details Patient Name: Date of Service: DO RN, Travis Threasa Alpha 5/16/2022andnbsp2:30 PM Medical Record Number: [id number] Patient Account Number: [id number] Date of Birth/Gender: Treating  RN: [birth year] (87 y.o. [name] Primary Care Physician: [name] Other Clinician: Referring Physician: Treating Physician/Extender: [name] in Treatment: 27 Education Assessment Education Provided To: Patient Education Topics Provided Wound/Skin Impairment: Methods: Explain/Verbal Responses: State content correctly [employer]) Signed: 05/26/2020 6:30:22 PM By: Levan Hurst RN, BSN Entered By: Levan Hurst on 05/24/2020 17:20:34 -------------------------------------------------------------------------------- Wound Assessment Details Patient Name: Date of Service: DO RN, Travis V ID L. 05/24/2020 2:30 PM Medical Record Number: [id number] Patient Account Number: [id number] Date of Birth/Sex: Treating RN: January 22, 1929 (87 y.o. [name], [name] Primary Care Azai Gaffin: [name] Other Clinician: Referring Nikhita Mentzel: Treating Ameer Sanden/Extender: [name] in Treatment: 27 Wound Status Wound Number: 15 Primary Pressure Ulcer Etiology: Wound Location: Sacrum Wound Open Wounding Event: Pressure Injury Status: Date Acquired: 08/01/2019 Comorbid Cataracts, Asthma, Chronic Obstructive Pulmonary Disease Weeks Of Treatment: 27 History: (COPD), Congestive Heart Failure, Coronary Artery Disease, Clustered Wound: No Hypertension, Peripheral Arterial Disease, Osteoarthritis, Confinement Anxiety Photos Wound Measurements Length: (cm) 0.2 Width: (cm) 0.2 Depth: (cm) 0.4 Area: (cm) 0.031 Volume: (cm) 0.013 % Reduction in Area: 85.9% % Reduction in Volume: 40.9% Epithelialization: Small (1-33%) Tunneling: No Undermining: Yes Starting Position (o'clock): 12 Ending Position (o'clock): 12 Maximum Distance: (cm) 0.6 Wound Description Classification: Category/Stage III Wound Margin: Well defined, not attached Exudate Amount: Medium Exudate Type: Serosanguineous Exudate Color: red, brown Foul Odor  After Cleansing: No Slough/Fibrino No Wound Bed Granulation Amount: Large (67-100%) Exposed Structure Granulation Quality: Pink, Pale Fascia Exposed: No Necrotic Amount: None Present (0%) Fat Layer (Subcutaneous Tissue) Exposed: Yes Tendon Exposed: No Muscle Exposed: No Joint Exposed: No Bone Exposed: No Treatment Notes Wound #15 (Sacrum) Cleanser Wound Cleanser Discharge Instruction: Cleanse  the wound with wound cleanser prior to applying a clean dressing using gauze sponges, not tissue or cotton balls. Peri-Wound Care Skin Prep Discharge Instruction: Use skin prep as directed Topical Primary Dressing Promogran Prisma Matrix, 4.34 (sq in) (silver collagen) Discharge Instruction: Moisten collagen with saline or hydrogel Secondary Dressing ComfortFoam Border, 3x3 in (silicone border) Discharge Instruction: Apply over primary dressing as directed. Secured With Compression Wrap Compression Stockings Environmental education officer) Signed: 05/25/2020 5:09:03 PM By: Rhae Hammock RN Signed: 05/26/2020 11:07:31 AM By: [name] Previous Signature: 05/24/2020 6:08:57 PM Version By: Rhae Hammock RN Entered By: [name] on 05/25/2020 16:03:02 -------------------------------------------------------------------------------- Wound Assessment Details Patient Name: Date of Service: DO RN, Travis V ID L. 05/24/2020 2:30 PM Medical Record Number: [id number] Patient Account Number: [id number] Date of Birth/Sex: Treating RN: 1929/10/22 (87 y.o. [name] Primary Care Azelyn Batie: [name] Other Clinician: Referring Bernadette Armijo: Treating Runette Scifres/Extender: [name] in Treatment: 27 Wound Status Wound Number: 18 Primary Trauma, Other Etiology: Wound Location: Left, Medial Lower Leg Wound Open Wounding Event: Trauma Status: Date Acquired: 04/08/2020 Comorbid Cataracts, Asthma, Chronic Obstructive Pulmonary Disease Weeks Of  Treatment: 4 History: (COPD), Congestive Heart Failure, Coronary Artery Disease, Clustered Wound: No Hypertension, Peripheral Arterial Disease, Osteoarthritis, Confinement Anxiety Wound Measurements Length: (cm) 4 Width: (cm) 1.4 Depth: (cm) 0.4 Area: (cm) 4.398 Volume: (cm) 1.759 % Reduction in Area: -60% % Reduction in Volume: -539.6% Tunneling: No Undermining: Yes Starting Position (o'clock): 10 Ending Position (o'clock): 4 Maximum Distance: (cm) 4 Wound Description Classification: Full Thickness Without Exposed Support Structures Wound Margin: Distinct, outline attached Exudate Amount: Medium Exudate Type: Serosanguineous Exudate Color: red, brown Foul Odor After Cleansing: No Slough/Fibrino Yes Wound Bed Granulation Amount: Medium (34-66%) Exposed Structure Granulation Quality: Red, Pink Fascia Exposed: No Necrotic Amount: Medium (34-66%) Fat Layer (Subcutaneous Tissue) Exposed: No Tendon Exposed: No Muscle Exposed: No Joint Exposed: No Bone Exposed: No Electronic Signature(s) Signed: 05/26/2020 6:30:22 PM By: Levan Hurst RN, BSN Entered By: Levan Hurst on 05/24/2020 15:36:44 -------------------------------------------------------------------------------- Wound Assessment Details Patient Name: Date of Service: DO RN, Travis V ID L. 05/24/2020 2:30 PM Medical Record Number: [id number] Patient Account Number: [id number] Date of Birth/Sex: Treating RN: [birth year] (87 y.o. [name] Primary Care Jaelani Posa: [name] Other Clinician: Referring Frisco Cordts: Treating Giankarlo Leamer/Extender: [name] in Treatment: 27 Wound Status Wound Number: 18 Primary Trauma, Other Etiology: Wound Location: Left, Medial Lower Leg Wound Open Wounding Event: Trauma Status: Date Acquired: 04/08/2020 Comorbid Cataracts, Asthma, Chronic Obstructive Pulmonary Disease Weeks Of Treatment: 4 History: (COPD), Congestive Heart Failure, Coronary Artery  Disease, Clustered Wound: No Hypertension, Peripheral Arterial Disease, Osteoarthritis, Confinement Anxiety Photos Wound Measurements Length: (cm) 4 Width: (cm) 1.4 Depth: (cm) 0.4 Area: (cm) 4.398 Volume: (cm) 1.759 Wound Description Classification: Full Thickness Without Exposed Support Structures Wound Margin: Distinct, outline attached Exudate Amount: Medium Exudate Type: Serosanguineous Exudate Color: red, brown Foul Odor After Cleansing: Slough/Fibrino % Reduction in Area: -60% % Reduction in Volume: -539.6% Tunneling: No Undermining: Yes Starting Position (o'clock): 10 Ending Position (o'clock): 4 Maximum Distance: (cm) 4 No Yes Wound Bed Granulation Amount: Medium (34-66%) Exposed Structure Granulation Quality: Red, Pink Fascia Exposed: No Necrotic Amount: Medium (34-66%) Fat Layer (Subcutaneous Tissue) Exposed: No Tendon Exposed: No Muscle Exposed: No Joint Exposed: No Bone Exposed: No Treatment Notes Wound #18 (Lower Leg) Wound Laterality: Left, Medial Cleanser Wound Cleanser Discharge Instruction: Cleanse the wound with wound cleanser prior to applying a clean dressing using gauze sponges, not tissue or  cotton balls. Soap and Water Discharge Instruction: May shower and wash wound with dial antibacterial soap and water prior to dressing change. Wound Cleanser Discharge Instruction: Cleanse the wound with wound cleanser prior to applying a clean dressing using gauze sponges, not tissue or cotton balls. Peri-Wound Care Triamcinolone 15 (g) Discharge Instruction: Use triamcinolone 15 (g) as directed Sween Lotion (Moisturizing lotion) Discharge Instruction: Apply moisturizing lotion as directed Topical Primary Dressing IODOFLEX 0.9% Cadexomer Iodine Pad 4x6 cm Discharge Instruction: or Iodosorb gel. Secondary Dressing Woven Gauze Sponge, Non-Sterile 4x4 in Discharge Instruction: Apply over primary dressing as directed. Secured With Compression  Wrap Kerlix Roll 4.5x3.1 (in/yd) Discharge Instruction: LIGHTLY wrap with kerlix/coban Coban Self-Adherent Wrap 4x5 (in/yd) Discharge Instruction: LIGHTLY wrap with kerlix/coban Compression Stockings Add-Ons Electronic Signature(s) Signed: 05/26/2020 11:07:31 AM By: [name] Signed: 05/26/2020 6:30:22 PM By: Levan Hurst RN, BSN Previous Signature: 05/24/2020 6:08:57 PM Version By: Rhae Hammock RN Entered By: [name] on 05/25/2020 16:02:44 -------------------------------------------------------------------------------- Wound Assessment Details Patient Name: Date of Service: DO RN, Travis V ID L. 05/24/2020 2:30 PM Medical Record Number: [id number] Patient Account Number: [id number] Date of Birth/Sex: Treating RN: 1929/02/09 (87 y.o. [name], [name] Primary Care Damiah Mcdonald: [name] Other Clinician: Referring Briany Aye: Treating Ala Kratz/Extender: [name] in Treatment: 27 Wound Status Wound Number: 19 Primary Pressure Ulcer Etiology: Wound Location: Nose Wound Open Wounding Event: Pressure Injury Status: Date Acquired: 04/20/2020 Comorbid Cataracts, Asthma, Chronic Obstructive Pulmonary Disease Weeks Of Treatment: 2 History: (COPD), Congestive Heart Failure, Coronary Artery Disease, Clustered Wound: No Hypertension, Peripheral Arterial Disease, Osteoarthritis, Confinement Anxiety Photos Wound Measurements Length: (cm) 0.5 % Width: (cm) 0.2 % Depth: (cm) 0.1 E Area: (cm) 0.079 Volume: (cm) 0.008 Reduction in Area: 44% Reduction in Volume: 42.9% pithelialization: Small (1-33%) Tunneling: No Undermining: No Wound Description Classification: Category/Stage II Wound Margin: Distinct, outline attached Exudate Amount: Medium Exudate Type: Serosanguineous Exudate Color: red, brown Foul Odor After Cleansing: No Slough/Fibrino No Wound Bed Granulation Amount: Small (1-33%) Exposed Structure Granulation Quality: Red,  Pink Fascia Exposed: No Necrotic Amount: Large (67-100%) Fat Layer (Subcutaneous Tissue) Exposed: No Necrotic Quality: Adherent Slough Tendon Exposed: No Muscle Exposed: No Joint Exposed: No Bone Exposed: No Treatment Notes Wound #19 (Nose) Cleanser Peri-Wound Care Topical Triple Antibiotic Ointment, 1 (oz) Tube Primary Dressing Secondary Dressing bandaid Secured With Compression Wrap Compression Stockings Add-Ons Electronic Signature(s) Signed: 05/25/2020 5:09:03 PM By: Rhae Hammock RN Signed: 05/26/2020 11:07:31 AM By: [name] Previous Signature: 05/24/2020 6:08:57 PM Version By: Rhae Hammock RN Entered By: [name] on 05/25/2020 16:02:16 -------------------------------------------------------------------------------- Vitals Details Patient Name: Date of Service: DO RN, Travis V ID L. 05/24/2020 2:30 PM Medical Record Number: [id number] Patient Account Number: [id number] Date of Birth/Sex: Treating RN: 1929/10/31 (87 y.o. [name], [name] Primary Care Murel Wigle: [name] Other Clinician: Referring Wm Sahagun: Treating Larin Weissberg/Extender: [name] in Treatment: 27 Vital Signs Time Taken: 14:46 Temperature (F): 97.4 Height (in): 69 Pulse (bpm): 84 Respiratory Rate (breaths/min): 17 Blood Pressure (mmHg): 115/74 Reference Range: 80 - 120 mg / dl Electronic Signature(s) Signed: 05/24/2020 6:08:57 PM By: Rhae Hammock RN Entered By: Rhae Hammock on 05/24/2020 14:49:26

## 2020-05-26 NOTE — Progress Notes (Signed)
[place], Ediel [name] ([id number]) Visit Report for 05/24/2020 Debridement Details Patient Name: Date of Service: DO RN, [place] V ID L. 05/24/2020 2:30 PM Medical Record Number: [id number] Patient Account Number: [id number] Date of Birth/Sex: Treating RN: 29-Jun-1929 (87 y.o. [name] Primary Care Provider: [name] Other Clinician: Referring Provider: Treating Provider/Extender: [name] in Treatment: 27 Debridement Performed for Assessment: Wound #18 Left,Medial Lower Leg Performed By: Physician Ricard Dillon., MD Debridement Type: Debridement Level of Consciousness (Pre-procedure): Awake and Alert Pre-procedure Verification/Time Out Yes - 15:40 Taken: Start Time: 15:40 T Area Debrided (L x W): otal 4 (cm) x 1.4 (cm) = 5.6 (cm) Tissue and other material debrided: Viable, Non-Viable, Slough, Subcutaneous, Slough Level: Skin/Subcutaneous Tissue Debridement Description: Excisional Instrument: Curette Bleeding: Moderate Hemostasis Achieved: Pressure End Time: 15:41 Procedural Pain: 0 Post Procedural Pain: 0 Response to Treatment: Procedure was tolerated well Level of Consciousness (Post- Awake and Alert procedure): Post Debridement Measurements of Total Wound Length: (cm) 4 Width: (cm) 1.4 Depth: (cm) 0.4 Volume: (cm) 1.759 Character of Wound/Ulcer Post Debridement: Requires Further Debridement Post Procedure Diagnosis Same as Pre-procedure Electronic Signature(s) Signed: 05/25/2020 4:16:43 PM By: [name] MD Signed: 05/26/2020 6:30:22 PM By: Levan Hurst RN, BSN Entered By: [name] on 05/24/2020 15:52:00 -------------------------------------------------------------------------------- HPI Details Patient Name: Date of Service: DO RN, [name] 05/24/2020 2:30 PM Medical Record Number: [id number] Patient Account Number: [id number] Date of Birth/Sex: Treating RN: 1929/12/15 (87 y.o. [name] Primary Care Provider:  [name] Other Clinician: Referring Provider: Treating Provider/Extender: [name] in Treatment: 27 History of Present Illness HPI Description: 01/30/18 on evaluation today patient presents for initial inspection concerning the skin tear of the right forearm. Fortunately there does not appear to be any evidence of infection and this occurred approximately three days ago. He has been using Vaseline over the region. With that being said the patient does have a history of cataracts COPD, hypertension, peripheral arterial disease, and osteoarthritis. He tells me that he does have a little bit of pain at the site but nothing too significant at this time which is good news. Fortunately this overall appears to be doing fairly well which is good news. No fevers, chills, nausea, or vomiting noted at this time. The biggest issue at this point is that the wound is of quite significant size. READMISSION 04/21/2019 This is a 87 year old man with multiple medical problems who was seen once in the clinic here in January 2020 with a skin tear on his right forearm seen by [name]. This problem this time started sometime recently. He was noted by podiatry to have erosions and wounds of his right foot on toes 1-3. He also saw [name] of dermatology who gave him Silvadene cream for what I think was felt to be a stasis dermatitis of his bilateral lower legs. At some point he was referred to [name]. He was also noted to have previous noninvasive arterial studies that showed no flow to either one of his toes and noncompressible ABIs bilaterally. [name] did noninvasive arterial studies on him. These showed that he had greater than 75% stenosis in the proximal and mid common femoral artery. Monophasic flow in the posterior tibial and dorsalis pedis positions on the right foot. ABIs again were noncompressible. He was felt to have total occlusion of the superficial femoral artery  on the right. The overall interpretation is that he had severe multilevel arterial occlusive disease. Absent femoral  pulses and superficial femoral artery occlusions. In spite of this he was not felt to be candidate for arteriography given his bilateral femoral artery occlusions as well he would not be a candidate for endovascular approach secondary to his common femoral artery occlusions and proximal iliac disease. He was not felt to be a candidate for an open infra inguinal bypass. He therefore felt he would need to be monitored over time with the only option being a right below-knee amputation The patient complains currently of pain at night when he is up in bed. The pain in the right foot is better when he puts the leg down over the bed side. This is compatible with rest pain. He has very limited activity i.e. is even exhausted getting dressed in the morning because of cardiopulmonary issues therefore it is difficult to gauge claudication I believe he was referred here by Dr. [name] with one of the major issues is whether he would be a candidate for hyperbaric oxygen. He is not a diabetic. Dr. [name] did give him Silvadene cream for dry flaking skin in his bilateral lower extremities, however the patient is concerned about using this in the face of sulfa allergy. 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 This is on the medial aspect extended there is a second area on the plantar aspect. Most of what the patient talks about is claudication with minimal activity [however the patient is limited by his COPD], or even at rest at night. He is not getting a lot of sleep. We  use silver collagen on this wound 5/4; the patient has a wound on the tip of his right great toe also on the medial aspect. Setting of severe nonrevascularizable PAD [see description in HPI]. He does not describe any change in his pain. We have been using silver collagen 5/18; 2-week follow-up. The patient has wound on the tip of his right great toe as well as the medial aspect. He has developed new wounds x2 on the right lateral foot and the right lateral calcaneus. These look like ischemic areas. The patient talks about claudication at night. He seems to be more comfortable during the day. We have been using silver collagen to the wounds. I have previously debrided the eschar on the toes however this comes right back. 6/1; 2-week follow-up. This patient has ischemic wounds on his right foot in the setting of nonunreconstructable PAD [previously reviewed by Dr. Dixon]. He does not really describe severe pain but he does have episodic pain in the right great toe. He has an area on the tip of the right great toe as well as the medial aspect of the right great toe. A new area on the tip of the right fourth toe. He has the area on the right lateral foot fortunately the area on the right lateral heel appears to have epithelialized over. We are using silver collagen to all wounds 06/23/2019 upon evaluation today patient appears to be doing about the same in regard to his foot ulcers. Unfortunately he has 2 new skin tears on his left elbow and the right forearm that were new as of today. He states that it was doing okay until they went to  change the dressing and they put some collagen on it that got stuck and somewhat pulled back the skin unfortunately. Fortunately there is no signs of active infection at this time. No fevers, chills, nausea, vomiting, or diarrhea. 6/29; the patient was hospitalized from 6/18 through 6/23 going in with increasing drainage from the wounds on his right foot. He underwent a  angiogram on 06/30/2019 by Dr. Donzetta Matters which revealed multilevel arterial disease including occlusion of the right common femoral artery. He reconstitutes the profunda femoris and the below-knee popliteal artery and appears to have posterior tibial peroneal runoff to the ankle although there is heavy calcification of the popliteal below the knee. He was offered consideration of a right above-knee amputation, palliative and/or hospice care and consideration of a very high risk attempt at bypass. He did receive IV antibiotics. He comes in the clinic today again with the 2 areas that are necrotic on the left great toe and a small punched out area on the right lateral foot. A lot of the erythema on top of the right foot but no warmth. He has a lot of swelling here. The patient is literally tortured over this decision that he needs to make. I went over this with him in exceptional detail. I do not believe he is a candidate for any form of open bypass surgery. I think his vascular doctors would agree with that. He still has pain at night when he puts his leg up on the bed that is relieved by putting his leg dependent. This is not different from what I remember. He is not septic there is no additional skin breakdown that I can see 7/8 the patient continues to have severe pain in the right foot at night making it difficult for him to rest. There is increasing erythema in the right foot but the foot is cold. I do not think this is an infection I think this is likely to be tissue necrosis from ischemia. I thought he would have made an up decision today about going forward with hospice he has not. He did not go to see [name] on 7/6. Again he wants to talk to me at length about amputations. I do not think he will do well after an above-knee amputation on the right. Furthermore I do not think he is a candidate for bypass surgery on the right leg 7/20; patient still having a lot of pain in the right foot. He saw Dr.  Donzetta Matters on 7/16. He is not felt to be able to undergo a complex revascularization and I certainly agree with that. He was offered a right below-knee amputation but he has not made that decision. I have repetitively suggested hospice care if he will not undergo an amputation he is not decided he wants to go that route either. He arrives in the clinic today accompanied by his son who is a Stage manager and his wife. I think the patient has tissue breakdown in the dorsal foot secondary to ischemia although his son brought up that some of the erythema could be a contact dermatitis as they were apparently layering a large area of pure call over the wound area. I was not aware of this not supposed to be doing it in this fashion. In any case the erythema look better than last week and I am quite convinced this is not cellulitis READMISSION 11/11/2019 Mr. Paula Libra is now an 87 year old man who we had in this clinic from [place] through July/21 largely as a result  of ischemic wounds in his right foot. He was followed by vein and vascular Dr. Durwin Nora. His anatomy was not felt to be amenable to a revascularization. The patient had increasing ischemic pain but was very resistant to the notion of an amputation. Eventually he did go through with this on August 22 he will underwent an AKA by Dr. Durwin Nora. He went to rehabilitation. There he developed a small stage III lower sacral pressure ulcer and an area on his left lateral heel which is also probably a pressure ulcer. They have been using silver collagen on both wound areas. The patient states he is not in any unrelieved pain. He is in a wheelchair at home with a Roho cushion. He is able to do his own transfers. I think he is probably putting far too much pressure on these areas Past medical history is reviewed. He has a history of CHF, severe PAD, hypertension, coronary artery disease status post CABG, COPD Gold stage IV on O2, hypothyroidism and a recent problem with  urinary retention he now has a Foley catheter. He has had problems with left leg swelling and adjustment of his diuretics he is followed by Dr. Verdis Prime. The patient has severe PAD. On 06/30/2019 he had an angiogram on the remaining left side he had heavily diseased femoral artery. His superficial femoral artery was flush occluded with runoff in the profunda. He was felt his SFA was likely occluded and reconstitutes the popliteal artery via runoff via the peroneal and posterior tibial artery. As noted he is not in a lot of pain. His last noninvasive study was on 6/20. On the left side this was noncompressible ABI but a TBI of only 0.20 with dampened monophasic waveforms 11/25/2019. Patient has 2 wound areas 1 on the left lateral heel and a ulcer on his coccyx. These are probably all pressure related although he has significant PAD complicating any attempt to heal a wound on the left heel. We have been using silver collagen on the wound and the coccyx silver alginate on the heel. 11/30; miraculously the left lateral heel wound has healed. The area on his coccyx is not much better. We have been using Prisma He apparently went back to see Dr. Durwin Nora. His wife paraphrasing says that he had terrible blood flow in the left lower extremity but he is not a candidate for revascularization. Fortunately had enough blood flow to heal the wound I will try to look at Dr. Darletta Moll note by the time these next year 12/14; the area on his left lateral heel remains closed The area on his coccyx is about the same in terms of surface area and depth minimal debris however but it is still adherent. We have been using Iodoflex. They stop me before I left the room to show me his right AKA stump. He tells me he is having a lot of pain in this area that is episodic it almost looks like there is bruising in this area but the bruising looks old I asked him about trauma but they were not aware of any. They see vascular  tomorrow 12/28; the area on the coccyx looks absolutely no better. Completely lifeless surface of fibrinous debris and surrounding raised skin. We have been using Iodoflex this is simply not working. He had a fall when he got up forgetting that he was missing his right leg he has skin tears on the right humerus area just above the elbow and on the right forearm. 1/11; the area on  the coccyx is about the same we have been using Santyl although the surface of this is no better. Small punched out wound. He also has skin tears on his arm which are drying up gradually. His wife had called urgently yesterday to be seen. Apparently he developed erythema and swelling of his left foot over the weekend and she was able to express a teaspoon of pus coming from the dorsal foot. She had called her podiatrist who prescribed Keflex however in response to advice from our office she took him to the emergency room. They apparently gave him IV antibiotics and discharged him on doxycycline. His white count was 14.5 with 92% neutrophils. He is not systemically unwell and does not really complain of a lot of pain 3/1; patient has not been here in about 7 weeks. In the interim he was hospitalized on 2 occasions from 01/23/2020 through 02/05/2020. The original admission was for cellulitis of the left leg he received IV antibiotics and Augmentin. It was noted that the right AKA stump broke down ultimately requiring a revision of the AKA by vein and vascular and he went to rehab medicine in hospital from 1/27 through 02/16/2020 He is back at home now. The only wound he has is on the lower coccyx everything else is closed. He is not complaining of any pain in the left leg although there is edema up into his mid thigh. He apparently has had his Lasix increased and this was felt to be secondary to congestive heart failure 3/22. The patient is followed here for a small wound on his lower sacrum/coccyx. He has been using silver  collagen the wound is small with some depth. They asked me to look at a fairly large annular lesion on the left anterior lower leg this is not scaled or painful. It is not pruritic. The patient's wife tells me this just came up over the weekend past 4/12; patient still has a very tiny open area on the lower sacrum/coccyx. I changed him to Iodoflex last time or Iodosorb ointment unknown though there were making too much of a difference here. Since he was last here he fell suffered a laceration on his left medial lower leg that required suturing. He ended up getting admitted from 3/31 through 4/3 apparently for apparently atrial fibrillation. He was put on amiodarone. He returned home he has not been able to tolerate the amiodarone and stopped that. Apparently he was on topical all that was discontinued as well because his blood pressure was too low. He has 3-4+ pitting edema in the left lower leg 4/19; the patient has been using Iodosorb gel to the sacral wound region and silver alginate to the trauma wound on his left leg. He continues to have stitches in place to the left leg wound. Per wife he is not able to offload on the sacral region very well. 4/26; patient presents for 1 week follow-up. He has been using collagen to the sacral wound and silver alginate to the left traumatic leg wound with Kerlix and Coban. He continues to have the stitches in place to his left lower leg. He also developed an area of skin breakdown on his nose from his CPAP machine. 5/2; the patient has a small but punched-out area on the lower sacrum. The skin tear from last week developed a necrotic area. The stitches were removed and the necrotic skin debrided. We have been using silver alginate to this under kerlix Coban 5/16; punched-out area in the lower sacrum  is about the same. Undermining 8.4 cm circumferentially the skin tear that was sutured from 2 weeks ago has an open area with undermining under the flap. Necrotic  tissue on the surface. We have been using silver collagen to both wound areas Electronic Signature(s) Signed: 05/25/2020 4:16:43 PM By: [name] MD Entered By: [name] on 05/24/2020 15:53:01 -------------------------------------------------------------------------------- Physical Exam Details Patient Name: Date of Service: DO RN, [name] 05/24/2020 2:30 PM Medical Record Number: [id number] Patient Account Number: [id number] Date of Birth/Sex: Treating RN: 1929/04/12 (87 y.o. [name] Primary Care Provider: [name] Other Clinician: Referring Provider: Treating Provider/Extender: [name] in Treatment: 27 Constitutional Sitting or standing Blood Pressure is within target range for patient.. Pulse regular and within target range for patient.Marland Kitchen Respirations regular, non-labored and within target range.. Temperature is normal and within the target range for the patient.Marland Kitchen Appears in no distress. Notes Wound exam; Sacral wound is unchanged small opening circumferential undermining. I cannot actually see the base of the wound The skin tear on the left leg medially this is deteriorated quite a bit since I saw him 2 weeks ago the tissue is no longer adherent. Wide undermining superiorly. The wound itself has a completely necrotic surface. I attempted to debride this. Surprisingly the bleeding was quite brisk. Subcutaneous debridement Electronic Signature(s) Signed: 05/25/2020 4:16:43 PM By: [name] MD Entered By: [name] on 05/24/2020 15:58:40 -------------------------------------------------------------------------------- Physician Orders Details Patient Name: Date of Service: DO RN, [name] 05/24/2020 2:30 PM Medical Record Number: [id number] Patient Account Number: [id number] Date of Birth/Sex: Treating RN: 1929/03/07 (87 y.o. 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 - with Dr. Dellia Nims Return A Bathing/ Shower/ Hygiene May shower and wash wound with soap and water. - with dressing changes only. Edema Control - Lymphedema / SCD / Other Elevate legs to the level of the heart or above for 30 minutes daily and/or when sitting, a frequency of: - throughout the day. Off-Loading Turn and reposition every 2 hours [place] wound care orders this week; continue Home Health for wound care. May utilize formulary equivalent dressing for wound treatment orders unless otherwise specified. - Change primary dressing to Iodoflex (or Iodosorb) to left leg wound. LIGHTLY wrap with kerlix and coban. Other Home Health Orders/Instructions: - [facility] Wound Treatment Wound #15 - Sacrum Cleanser: Wound Cleanser (Home Health) Every Other Day/30 Days Discharge Instructions: Cleanse the wound with wound cleanser prior to applying a clean dressing using gauze sponges, not tissue or cotton balls. Peri-Wound Care: Skin Prep [facility]) Every Other Day/30 Days Discharge Instructions: Use skin prep as directed Prim Dressing: Promogran Prisma Matrix, 4.34 (sq in) (silver collagen) (Home Health) Every Other Day/30 Days ary Discharge Instructions: Moisten collagen with saline or hydrogel Secondary Dressing: ComfortFoam Border, 3x3 in (silicone border) [facility]) Every Other Day/30 Days Discharge Instructions: Apply over primary dressing as directed. Wound #18 - Lower Leg Wound Laterality: Left,  Medial Cleanser: Soap and Water (Home Health) 1 x Per Week/15 Days Discharge Instructions: May shower and wash wound  with dial antibacterial soap and water prior to dressing change. Cleanser: Wound Cleanser (Home Health) 1 x Per Week/15 Days Discharge Instructions: Cleanse the wound with wound cleanser prior to applying a clean dressing using gauze sponges, not tissue or cotton balls. Cleanser: Wound Cleanser (Home Health) 1 x Per Week/15 Days Discharge Instructions: Cleanse the wound with wound cleanser prior to applying a clean dressing using gauze sponges, not tissue or cotton balls. Peri-Wound Care: Triamcinolone 15 (g) (Home Health) 1 x Per Week/15 Days Discharge Instructions: Use triamcinolone 15 (g) as directed Peri-Wound Care: Sween Lotion (Moisturizing lotion) (Home Health) 1 x Per Week/15 Days Discharge Instructions: Apply moisturizing lotion as directed Prim Dressing: IODOFLEX 0.9% Cadexomer Iodine Pad 4x6 cm (Home Health) 1 x Per Week/15 Days ary Discharge Instructions: or Iodosorb gel. Secondary Dressing: Woven Gauze Sponge, Non-Sterile 4x4 in (Home Health) 1 x Per Week/15 Days Discharge Instructions: Apply over primary dressing as directed. Compression Wrap: Kerlix Roll 4.5x3.1 (in/yd) (Home Health) 1 x Per Week/15 Days Discharge Instructions: LIGHTLY wrap with kerlix/coban Compression Wrap: Coban Self-Adherent Wrap 4x5 (in/yd) (Home Health) 1 x Per Week/15 Days Discharge Instructions: LIGHTLY wrap with kerlix/coban Wound #19 - Nose Topical: Triple Antibiotic Ointment, 1 (oz) Tube 1 x Per Day/30 Days Secondary Dressing: bandaid ([place]) 1 x Per Day/30 Days Electronic Signature(s) Signed: 05/25/2020 4:16:43 PM By: [name] MD Signed: 05/26/2020 6:30:22 PM By: Levan Hurst RN, BSN Entered By: Levan Hurst on 05/24/2020 15:49:19 -------------------------------------------------------------------------------- Problem List Details Patient Name: Date of  Service: DO RN, [name] 05/24/2020 2:30 PM Medical Record Number: [id number] Patient Account Number: [id number] Date of Birth/Sex: Treating RN: December 27, 1929 (87 y.o. 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 11/11/2019 No Yes L98.491 Non-pressure chronic ulcer of skin of other sites limited to breakdown of skin 05/04/2020 No Yes Inactive Problems ICD-10 Code Description Active Date Inactive Date L89.623 Pressure ulcer of left heel, stage 3 11/11/2019 11/11/2019 L03.116 Cellulitis of left lower limb 01/20/2020 01/20/2020 Resolved Problems ICD-10 Code Description Active Date Resolved Date S40.022D Contusion of left upper arm, subsequent encounter 01/06/2020 01/06/2020 Electronic Signature(s) Signed: 05/25/2020 4:16:43 PM By: [name] MD Entered By: [name] on 05/24/2020 15:51:36 -------------------------------------------------------------------------------- Progress Note Details Patient Name: Date of Service: DO RN, [name] 05/24/2020 2:30 PM Medical Record Number: [id number] Patient Account Number: [id number] Date of Birth/Sex: Treating RN: [birth year] (87 y.o. [name] Primary Care Provider: [name] Other Clinician: Referring Provider: Treating Provider/Extender: [name] in Treatment: 27 Subjective History of Present Illness (HPI) 01/30/18 on evaluation today patient presents for initial inspection concerning the skin tear of the right forearm. Fortunately there does not appear to be any evidence of infection and this  occurred approximately three days ago. He has been using Vaseline over the region. With that being said the patient does have a history of cataracts COPD, hypertension, peripheral arterial disease, and osteoarthritis. He tells me that he does have a little bit of pain at the site but nothing too significant at this time which is good news. Fortunately this overall appears to be doing fairly well which is good news. No fevers, chills, nausea, or  vomiting noted at this time. The biggest issue at this point is that the wound is of quite significant size. READMISSION 04/21/2019 This is a 87 year old man with multiple medical problems who was seen once in the clinic here in January 2020 with a skin tear on his right forearm seen by [name]. This problem this time started sometime recently. He was noted by podiatry to have erosions and wounds of his right foot on toes 1-3. He also saw [name] of dermatology who gave him Silvadene cream for what I think was felt to be a stasis dermatitis of his bilateral lower legs. At some point he was referred to [name]. He was also noted to have previous noninvasive arterial studies that showed no flow to either one of his toes and noncompressible ABIs bilaterally. [name] did noninvasive arterial studies on him. These showed that he had greater than 75% stenosis in the proximal and mid common femoral artery. Monophasic flow in the posterior tibial and dorsalis pedis positions on the right foot. ABIs again were noncompressible. He was felt to have total occlusion of the superficial femoral artery on the right. The overall interpretation is that he had severe multilevel arterial occlusive disease. Absent femoral pulses and superficial femoral artery occlusions. In spite of this he was not felt to be candidate for arteriography given his bilateral femoral artery occlusions as well he would not be a candidate for endovascular approach secondary to his common  femoral artery occlusions and proximal iliac disease. He was not felt to be a candidate for an open infra inguinal bypass. He therefore felt he would need to be monitored over time with the only option being a right below-knee amputation The patient complains currently of pain at night when he is up in bed. The pain in the right foot is better when he puts the leg down over the bed side. This is compatible with rest pain. He has very limited activity i.e. is even exhausted getting dressed in the morning because of cardiopulmonary issues therefore it is difficult to gauge claudication I believe he was referred here by Dr. [name] with one of the major issues is whether he would be a candidate for hyperbaric oxygen. He is not a diabetic. Dr. [name] did give him Silvadene cream for dry flaking skin in his bilateral lower extremities, however the patient is concerned about using this in the face of sulfa allergy. 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 This is on the medial aspect extended there is a second area on the plantar aspect. Most of what the patient talks about is claudication with minimal activity [however the patient is limited by his COPD], or even at rest at night. He is not getting a lot of sleep. We use silver collagen on this wound 5/4; the patient has a wound on the tip of his right great toe also on the medial aspect. Setting of severe nonrevascularizable PAD [see description in HPI]. He does not describe any change in his pain. We have been using silver collagen 5/18; 2-week follow-up. The patient has wound on the tip of his right great toe as well as the  medial aspect. He has  developed new wounds x2 on the right lateral foot and the right lateral calcaneus. These look like ischemic areas. The patient talks about claudication at night. He seems to be more comfortable during the day. We have been using silver collagen to the wounds. I have previously debrided the eschar on the toes however this comes right back. 6/1; 2-week follow-up. This patient has ischemic wounds on his right foot in the setting of nonunreconstructable PAD [previously reviewed by Dr. Dixon]. He does not really describe severe pain but he does have episodic pain in the right great toe. He has an area on the tip of the right great toe as well as the medial aspect of the right great toe. A new area on the tip of the right fourth toe. He has the area on the right lateral foot fortunately the area on the right lateral heel appears to have epithelialized over. We are using silver collagen to all wounds 06/23/2019 upon evaluation today patient appears to be doing about the same in regard to his foot ulcers. Unfortunately he has 2 new skin tears on his left elbow and the right forearm that were new as of today. He states that it was doing okay until they went to change the dressing and they put some collagen on it that got stuck and somewhat pulled back the skin unfortunately. Fortunately there is no signs of active infection at this time. No fevers, chills, nausea, vomiting, or diarrhea. 6/29; the patient was hospitalized from 6/18 through 6/23 going in with increasing drainage from the wounds on his right foot. He underwent a angiogram on 06/30/2019 by Dr. Donzetta Matters which revealed multilevel arterial disease including occlusion of the right common femoral artery. He reconstitutes the profunda femoris and the below-knee popliteal artery and appears to have posterior tibial peroneal runoff to the ankle although there is heavy calcification of the popliteal below the knee. He was offered  consideration of a right above-knee amputation, palliative and/or hospice care and consideration of a very high risk attempt at bypass. He did receive IV antibiotics. He comes in the clinic today again with the 2 areas that are necrotic on the left great toe and a small punched out area on the right lateral foot. A lot of the erythema on top of the right foot but no warmth. He has a lot of swelling here. The patient is literally tortured over this decision that he needs to make. I went over this with him in exceptional detail. I do not believe he is a candidate for any form of open bypass surgery. I think his vascular doctors would agree with that. He still has pain at night when he puts his leg up on the bed that is relieved by putting his leg dependent. This is not different from what I remember. He is not septic there is no additional skin breakdown that I can see 7/8 the patient continues to have severe pain in the right foot at night making it difficult for him to rest. There is increasing erythema in the right foot but the foot is cold. I do not think this is an infection I think this is likely to be tissue necrosis from ischemia. I thought he would have made an up decision today about going forward with hospice he has not. He did not go to see [name] on 7/6. Again he wants to talk to me at length about amputations. I do not think he will do well after an above-knee  amputation on the right. Furthermore I do not think he is a candidate for bypass surgery on the right leg 7/20; patient still having a lot of pain in the right foot. He saw Dr. Donzetta Matters on 7/16. He is not felt to be able to undergo a complex revascularization and I certainly agree with that. He was offered a right below-knee amputation but he has not made that decision. I have repetitively suggested hospice care if he will not undergo an amputation he is not decided he wants to go that route either. He arrives in the clinic today  accompanied by his son who is a Stage manager and his wife. I think the patient has tissue breakdown in the dorsal foot secondary to ischemia although his son brought up that some of the erythema could be a contact dermatitis as they were apparently layering a large area of pure call over the wound area. I was not aware of this not supposed to be doing it in this fashion. In any case the erythema look better than last week and I am quite convinced this is not cellulitis READMISSION 11/11/2019 Mr. Paula Libra is now an 87 year old man who we had in this clinic from [place] through July/21 largely as a result of ischemic wounds in his right foot. He was followed by vein and vascular [name]. His anatomy was not felt to be amenable to a revascularization. The patient had increasing ischemic pain but was very resistant to the notion of an amputation. Eventually he did go through with this on August 22 he will underwent an AKA by [name]. He went to rehabilitation. There he developed a small stage III lower sacral pressure ulcer and an area on his left lateral heel which is also probably a pressure ulcer. They have been using silver collagen on both wound areas. The patient states he is not in any unrelieved pain. He is in a wheelchair at home with a Roho cushion. He is able to do his own transfers. I think he is probably putting far too much pressure on these areas Past medical history is reviewed. He has a history of CHF, severe PAD, hypertension, coronary artery disease status post CABG, COPD Gold stage IV on O2, hypothyroidism and a recent problem with urinary retention he now has a Foley catheter. He has had problems with left leg swelling and adjustment of his diuretics he is followed by Dr. Daneen Schick. The patient has severe PAD. On 06/30/2019 he had an angiogram on the remaining left side he had heavily diseased femoral artery. His superficial femoral artery was flush occluded with runoff in the  profunda. He was felt his SFA was likely occluded and reconstitutes the popliteal artery via runoff via the peroneal and posterior tibial artery. As noted he is not in a lot of pain. His last noninvasive study was on 6/20. On the left side this was noncompressible ABI but a TBI of only 0.20 with dampened monophasic waveforms 11/25/2019. Patient has 2 wound areas 1 on the left lateral heel and a ulcer on his coccyx. These are probably all pressure related although he has significant PAD complicating any attempt to heal a wound on the left heel. We have been using silver collagen on the wound and the coccyx silver alginate on the heel. 11/30; miraculously the left lateral heel wound has healed. The area on his coccyx is not much better. We have been using Prisma He apparently went back to see [name]. His wife paraphrasing says  that he had terrible blood flow in the left lower extremity but he is not a candidate for revascularization. Fortunately had enough blood flow to heal the wound I will try to look at Dr. Mee Hives note by the time these next year 12/14; the area on his left lateral heel remains closed ooThe area on his coccyx is about the same in terms of surface area and depth minimal debris however but it is still adherent. We have been using Iodoflex. ooThey stop me before I left the room to show me his right AKA stump. He tells me he is having a lot of pain in this area that is episodic it almost looks like there is bruising in this area but the bruising looks old I asked him about trauma but they were not aware of any. They see vascular tomorrow 12/28; the area on the coccyx looks absolutely no better. Completely lifeless surface of fibrinous debris and surrounding raised skin. We have been using Iodoflex this is simply not working. ooHe had a fall when he got up forgetting that he was missing his right leg he has skin tears on the right humerus area just above the elbow and on the  right forearm. 1/11; the area on the coccyx is about the same we have been using Santyl although the surface of this is no better. Small punched out wound. He also has skin tears on his arm which are drying up gradually. His wife had called urgently yesterday to be seen. Apparently he developed erythema and swelling of his left foot over the weekend and she was able to express a teaspoon of pus coming from the dorsal foot. She had called her podiatrist who prescribed Keflex however in response to advice from our office she took him to the emergency room. They apparently gave him IV antibiotics and discharged him on doxycycline. His white count was 14.5 with 92% neutrophils. He is not systemically unwell and does not really complain of a lot of pain 3/1; patient has not been here in about 7 weeks. In the interim he was hospitalized on 2 occasions from 01/23/2020 through 02/05/2020. The original admission was for cellulitis of the left leg he received IV antibiotics and Augmentin. It was noted that the right AKA stump broke down ultimately requiring a revision of the AKA by vein and vascular and he went to rehab medicine in hospital from 1/27 through 02/16/2020 He is back at home now. The only wound he has is on the lower coccyx everything else is closed. He is not complaining of any pain in the left leg although there is edema up into his mid thigh. He apparently has had his Lasix increased and this was felt to be secondary to congestive heart failure 3/22. The patient is followed here for a small wound on his lower sacrum/coccyx. He has been using silver collagen the wound is small with some depth. ooThey asked me to look at a fairly large annular lesion on the left anterior lower leg this is not scaled or painful. It is not pruritic. The patient's wife tells me this just came up over the weekend past 4/12; patient still has a very tiny open area on the lower sacrum/coccyx. I changed him to Iodoflex  last time or Iodosorb ointment unknown though there were making too much of a difference here. Since he was last here he fell suffered a laceration on his left medial lower leg that required suturing. He ended up getting admitted from  3/31 through 4/3 apparently for apparently atrial fibrillation. He was put on amiodarone. He returned home he has not been able to tolerate the amiodarone and stopped that. Apparently he was on topical all that was discontinued as well because his blood pressure was too low. He has 3-4+ pitting edema in the left lower leg 4/19; the patient has been using Iodosorb gel to the sacral wound region and silver alginate to the trauma wound on his left leg. He continues to have stitches in place to the left leg wound. Per wife he is not able to offload on the sacral region very well. 4/26; patient presents for 1 week follow-up. He has been using collagen to the sacral wound and silver alginate to the left traumatic leg wound with Kerlix and Coban. He continues to have the stitches in place to his left lower leg. He also developed an area of skin breakdown on his nose from his CPAP machine. 5/2; the patient has a small but punched-out area on the lower sacrum. The skin tear from last week developed a necrotic area. The stitches were removed and the necrotic skin debrided. We have been using silver alginate to this under kerlix Coban 5/16; punched-out area in the lower sacrum is about the same. Undermining 8.4 cm circumferentially the skin tear that was sutured from 2 weeks ago has an open area with undermining under the flap. Necrotic tissue on the surface. We have been using silver collagen to both wound areas Objective Constitutional Sitting or standing Blood Pressure is within target range for patient.. Pulse regular and within target range for patient.Marland Kitchen Respirations regular, non-labored and within target range.. Temperature is normal and within the target range for the  patient.Marland Kitchen Appears in no distress. Vitals Time Taken: 2:46 PM, Height: 69 in, Temperature: 97.4 F, Pulse: 84 bpm, Respiratory Rate: 17 breaths/min, Blood Pressure: 115/74 mmHg. General Notes: Wound exam; oo Sacral wound is unchanged small opening circumferential undermining. I cannot actually see the base of the wound oo The skin tear on the left leg medially this is deteriorated quite a bit since I saw him 2 weeks ago the tissue is no longer adherent. Wide undermining superiorly. The wound itself has a completely necrotic surface. I attempted to debride this. Surprisingly the bleeding was quite brisk. Subcutaneous debridement Integumentary (Hair, Skin) Wound #15 status is Open. Original cause of wound was Pressure Injury. The date acquired was: 08/01/2019. The wound has been in treatment 27 weeks. The wound is located on the Sacrum. The wound measures 0.2cm length x 0.2cm width x 0.4cm depth; 0.031cm^2 area and 0.013cm^3 volume. There is Fat Layer (Subcutaneous Tissue) exposed. There is no tunneling noted, however, there is undermining starting at 12:00 and ending at 12:00 with a maximum distance of 0.6cm. There is a medium amount of serosanguineous drainage noted. The wound margin is well defined and not attached to the wound base. There is large (67- 100%) pink, pale granulation within the wound bed. There is no necrotic tissue within the wound bed. Wound #18 status is Open. Original cause of wound was Trauma. The date acquired was: 04/08/2020. The wound has been in treatment 4 weeks. The wound is located on the Left,Medial Lower Leg. The wound measures 4cm length x 1.4cm width x 0.4cm depth; 4.398cm^2 area and 1.759cm^3 volume. There is Fat Layer (Subcutaneous Tissue) exposed. There is undermining starting at 12:00 and ending at 12:00 with a maximum distance of 4cm. There is a medium amount of serosanguineous drainage noted. The  wound margin is distinct with the outline attached to the wound base.  There is medium (34-66%) red, pink granulation within the wound bed. There is a medium (34-66%) amount of necrotic tissue within the wound bed including Adherent Slough. Wound #18 status is Open. Original cause of wound was Trauma. The date acquired was: 04/08/2020. The wound has been in treatment 4 weeks. The wound is located on the Left,Medial Lower Leg. The wound measures 4cm length x 1.4cm width x 0.4cm depth; 4.398cm^2 area and 1.759cm^3 volume. There is no tunneling noted, however, there is undermining starting at 10:00 and ending at 4:00 with a maximum distance of 4cm. There is a medium amount of serosanguineous drainage noted. The wound margin is distinct with the outline attached to the wound base. There is medium (34-66%) red, pink granulation within the wound bed. There is a medium (34-66%) amount of necrotic tissue within the wound bed. Wound #19 status is Open. Original cause of wound was Pressure Injury. The date acquired was: 04/20/2020. The wound has been in treatment 2 weeks. The wound is located on the Nose. The wound measures 0.5cm length x 0.2cm width x 0.1cm depth; 0.079cm^2 area and 0.008cm^3 volume. There is no tunneling or undermining noted. There is a medium amount of serosanguineous drainage noted. The wound margin is distinct with the outline attached to the wound base. There is small (1-33%) red, pink granulation within the wound bed. There is a large (67-100%) amount of necrotic tissue within the wound bed including Adherent Slough. 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 There was a Excisional Skin/Subcutaneous Tissue Debridement with a  total area of 5.6 sq cm performed by Ricard Dillon., MD. With the following instrument(s): Curette to remove Viable and Non-Viable tissue/material. Material removed includes Subcutaneous Tissue and Slough and. No specimens were taken. A time out was conducted at 15:40, prior to the start of the procedure. A Moderate amount of bleeding was controlled with Pressure. The procedure was tolerated well with a pain level of 0 throughout and a pain level of 0 following the procedure. Post Debridement Measurements: 4cm length x 1.4cm width x 0.4cm depth; 1.759cm^3 volume. Character of Wound/Ulcer Post Debridement requires further debridement. Post procedure Diagnosis Wound #18: Same as Pre-Procedure Plan Follow-up Appointments: Return Appointment in 2 weeks. - with Dr. Dellia Nims Bathing/ Shower/ Hygiene: May shower and wash wound with soap and water. - with dressing changes only. Edema Control - Lymphedema / SCD / Other: Elevate legs to the level of the heart or above for 30 minutes daily and/or when sitting, a frequency of: - throughout the day. Off-Loading: Turn and reposition every 2 hours Home Health: New wound care orders this week; continue Home Health for wound care. May utilize formulary equivalent dressing for wound treatment orders unless otherwise specified. - Change primary dressing to Iodoflex (or Iodosorb) to left leg wound. LIGHTLY wrap with kerlix and coban. Other Home Health Orders/Instructions: - [facility] WOUND #15: - Sacrum Wound Laterality: Cleanser: Wound Cleanser (Home Health) Every Other Day/30 Days Discharge Instructions: Cleanse the wound with wound cleanser prior to applying a clean dressing using gauze sponges, not tissue or cotton balls. Peri-Wound Care: Skin Prep [facility]) Every Other  Day/30 Days Discharge Instructions: Use skin prep as directed Prim Dressing: Promogran Prisma Matrix, 4.34 (sq in) (silver collagen) (Home Health) Every Other Day/30  Days ary Discharge Instructions: Moisten collagen with saline or hydrogel Secondary Dressing: ComfortFoam Border, 3x3 in (silicone border) [facility]) Every Other Day/30 Days Discharge Instructions: Apply over primary dressing as directed. WOUND #18: - Lower Leg Wound Laterality: Left, Medial Cleanser: Soap and Water (Home Health) 1 x Per Week/15 Days Discharge Instructions: May shower and wash wound with dial antibacterial soap and water prior to dressing change. Cleanser: Wound Cleanser (Home Health) 1 x Per Week/15 Days Discharge Instructions: Cleanse the wound with wound cleanser prior to applying a clean dressing using gauze sponges, not tissue or cotton balls. Cleanser: Wound Cleanser (Home Health) 1 x Per Week/15 Days Discharge Instructions: Cleanse the wound with wound cleanser prior to applying a clean dressing using gauze sponges, not tissue or cotton balls. Peri-Wound Care: Triamcinolone 15 (g) (Home Health) 1 x Per Week/15 Days Discharge Instructions: Use triamcinolone 15 (g) as directed Peri-Wound Care: Sween Lotion (Moisturizing lotion) (Home Health) 1 x Per Week/15 Days Discharge Instructions: Apply moisturizing lotion as directed Prim Dressing: IODOFLEX 0.9% Cadexomer Iodine Pad 4x6 cm (Home Health) 1 x Per Week/15 Days ary Discharge Instructions: or Iodosorb gel. Secondary Dressing: Woven Gauze Sponge, Non-Sterile 4x4 in (Home Health) 1 x Per Week/15 Days Discharge Instructions: Apply over primary dressing as directed. Com pression Wrap: Kerlix Roll 4.5x3.1 (in/yd) (Home Health) 1 x Per Week/15 Days Discharge Instructions: LIGHTLY wrap with kerlix/coban Com pression Wrap: Coban Self-Adherent Wrap 4x5 (in/yd) (Home Health) 1 x Per Week/15 Days Discharge Instructions: LIGHTLY wrap with kerlix/coban WOUND #19: - Nose Wound Laterality: Topical: Triple Antibiotic Ointment, 1 (oz) Tube 1 x Per Day/30 Days Secondary Dressing: bandaid ([place]) 1 x Per Day/30 Days 1. 2  difficult wounds. 2. The area on the left medial leg is a lot worse this week than what I described 2 weeks ago. Necrotic surface on the base of an open area with wide undermining superiorly. I did a debridement of this I am going to try to use Iodoflex to get some adherence. 3. I have also going to try to get Oasis approved for both wound areas. 4. Continuing with silver collagen on the coccyx Iodoflex on the left medial leg 5. They also have me look at the bridge of his nose which is an irritated area he says from his CPAP. Only a very small area here however there appears to be some subcutaneous involvement. I have asked them to offload this with foam or Band-Aids when using the CPAP Electronic Signature(s) Signed: 05/25/2020 4:16:43 PM By: [name] MD Entered By: [name] on 05/24/2020 16:00:34 -------------------------------------------------------------------------------- SuperBill Details Patient Name: Date of Service: DO RN, [name] 05/24/2020 Medical Record Number: [id number] Patient Account Number: [id number] Date of Birth/Sex: Treating RN: July 25, 1929 (87 y.o. 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

## 2020-05-27 DIAGNOSIS — I739 Peripheral vascular disease, unspecified: Secondary | ICD-10-CM | POA: Diagnosis not present

## 2020-05-27 DIAGNOSIS — I5032 Chronic diastolic (congestive) heart failure: Secondary | ICD-10-CM | POA: Diagnosis not present

## 2020-05-27 DIAGNOSIS — L89152 Pressure ulcer of sacral region, stage 2: Secondary | ICD-10-CM | POA: Diagnosis not present

## 2020-05-27 DIAGNOSIS — J9611 Chronic respiratory failure with hypoxia: Secondary | ICD-10-CM | POA: Diagnosis not present

## 2020-05-27 DIAGNOSIS — I110 Hypertensive heart disease with heart failure: Secondary | ICD-10-CM | POA: Diagnosis not present

## 2020-05-27 DIAGNOSIS — Z4781 Encounter for orthopedic aftercare following surgical amputation: Secondary | ICD-10-CM | POA: Diagnosis not present

## 2020-05-28 DIAGNOSIS — I110 Hypertensive heart disease with heart failure: Secondary | ICD-10-CM | POA: Diagnosis not present

## 2020-05-28 DIAGNOSIS — Z4781 Encounter for orthopedic aftercare following surgical amputation: Secondary | ICD-10-CM | POA: Diagnosis not present

## 2020-05-28 DIAGNOSIS — J9611 Chronic respiratory failure with hypoxia: Secondary | ICD-10-CM | POA: Diagnosis not present

## 2020-05-28 DIAGNOSIS — I739 Peripheral vascular disease, unspecified: Secondary | ICD-10-CM | POA: Diagnosis not present

## 2020-05-28 DIAGNOSIS — I5032 Chronic diastolic (congestive) heart failure: Secondary | ICD-10-CM | POA: Diagnosis not present

## 2020-05-28 DIAGNOSIS — L89152 Pressure ulcer of sacral region, stage 2: Secondary | ICD-10-CM | POA: Diagnosis not present

## 2020-05-30 DIAGNOSIS — J9611 Chronic respiratory failure with hypoxia: Secondary | ICD-10-CM | POA: Diagnosis not present

## 2020-05-30 DIAGNOSIS — L89152 Pressure ulcer of sacral region, stage 2: Secondary | ICD-10-CM | POA: Diagnosis not present

## 2020-05-30 DIAGNOSIS — Z4781 Encounter for orthopedic aftercare following surgical amputation: Secondary | ICD-10-CM | POA: Diagnosis not present

## 2020-05-30 DIAGNOSIS — I5032 Chronic diastolic (congestive) heart failure: Secondary | ICD-10-CM | POA: Diagnosis not present

## 2020-05-30 DIAGNOSIS — I739 Peripheral vascular disease, unspecified: Secondary | ICD-10-CM | POA: Diagnosis not present

## 2020-05-30 DIAGNOSIS — I110 Hypertensive heart disease with heart failure: Secondary | ICD-10-CM | POA: Diagnosis not present

## 2020-06-02 DIAGNOSIS — S81802D Unspecified open wound, left lower leg, subsequent encounter: Secondary | ICD-10-CM | POA: Diagnosis not present

## 2020-06-02 DIAGNOSIS — Z89611 Acquired absence of right leg above knee: Secondary | ICD-10-CM | POA: Diagnosis not present

## 2020-06-02 DIAGNOSIS — I2510 Atherosclerotic heart disease of native coronary artery without angina pectoris: Secondary | ICD-10-CM | POA: Diagnosis not present

## 2020-06-02 DIAGNOSIS — I739 Peripheral vascular disease, unspecified: Secondary | ICD-10-CM | POA: Diagnosis not present

## 2020-06-02 DIAGNOSIS — Z4781 Encounter for orthopedic aftercare following surgical amputation: Secondary | ICD-10-CM | POA: Diagnosis not present

## 2020-06-02 DIAGNOSIS — R899 Unspecified abnormal finding in specimens from other organs, systems and tissues: Secondary | ICD-10-CM | POA: Diagnosis not present

## 2020-06-02 DIAGNOSIS — I5032 Chronic diastolic (congestive) heart failure: Secondary | ICD-10-CM | POA: Diagnosis not present

## 2020-06-02 DIAGNOSIS — M6281 Muscle weakness (generalized): Secondary | ICD-10-CM | POA: Diagnosis not present

## 2020-06-02 DIAGNOSIS — R531 Weakness: Secondary | ICD-10-CM | POA: Diagnosis not present

## 2020-06-02 DIAGNOSIS — R339 Retention of urine, unspecified: Secondary | ICD-10-CM | POA: Diagnosis not present

## 2020-06-02 DIAGNOSIS — K921 Melena: Secondary | ICD-10-CM | POA: Diagnosis not present

## 2020-06-02 DIAGNOSIS — Z466 Encounter for fitting and adjustment of urinary device: Secondary | ICD-10-CM | POA: Diagnosis not present

## 2020-06-02 DIAGNOSIS — I87322 Chronic venous hypertension (idiopathic) with inflammation of left lower extremity: Secondary | ICD-10-CM | POA: Diagnosis not present

## 2020-06-02 DIAGNOSIS — J9611 Chronic respiratory failure with hypoxia: Secondary | ICD-10-CM | POA: Diagnosis not present

## 2020-06-02 DIAGNOSIS — I480 Paroxysmal atrial fibrillation: Secondary | ICD-10-CM | POA: Diagnosis not present

## 2020-06-02 DIAGNOSIS — N139 Obstructive and reflux uropathy, unspecified: Secondary | ICD-10-CM | POA: Diagnosis not present

## 2020-06-02 DIAGNOSIS — Z9981 Dependence on supplemental oxygen: Secondary | ICD-10-CM | POA: Diagnosis not present

## 2020-06-02 DIAGNOSIS — J449 Chronic obstructive pulmonary disease, unspecified: Secondary | ICD-10-CM | POA: Diagnosis not present

## 2020-06-02 DIAGNOSIS — E871 Hypo-osmolality and hyponatremia: Secondary | ICD-10-CM | POA: Diagnosis not present

## 2020-06-02 DIAGNOSIS — I110 Hypertensive heart disease with heart failure: Secondary | ICD-10-CM | POA: Diagnosis not present

## 2020-06-02 DIAGNOSIS — R262 Difficulty in walking, not elsewhere classified: Secondary | ICD-10-CM | POA: Diagnosis not present

## 2020-06-02 DIAGNOSIS — L89152 Pressure ulcer of sacral region, stage 2: Secondary | ICD-10-CM | POA: Diagnosis not present

## 2020-06-02 DIAGNOSIS — D649 Anemia, unspecified: Secondary | ICD-10-CM | POA: Diagnosis not present

## 2020-06-03 DIAGNOSIS — J9611 Chronic respiratory failure with hypoxia: Secondary | ICD-10-CM | POA: Diagnosis not present

## 2020-06-03 DIAGNOSIS — L89152 Pressure ulcer of sacral region, stage 2: Secondary | ICD-10-CM | POA: Diagnosis not present

## 2020-06-03 DIAGNOSIS — Z4781 Encounter for orthopedic aftercare following surgical amputation: Secondary | ICD-10-CM | POA: Diagnosis not present

## 2020-06-03 DIAGNOSIS — I110 Hypertensive heart disease with heart failure: Secondary | ICD-10-CM | POA: Diagnosis not present

## 2020-06-03 DIAGNOSIS — I739 Peripheral vascular disease, unspecified: Secondary | ICD-10-CM | POA: Diagnosis not present

## 2020-06-03 DIAGNOSIS — I5032 Chronic diastolic (congestive) heart failure: Secondary | ICD-10-CM | POA: Diagnosis not present

## 2020-06-05 ENCOUNTER — Emergency Department (HOSPITAL_COMMUNITY): Payer: Medicare Other

## 2020-06-05 ENCOUNTER — Inpatient Hospital Stay (HOSPITAL_COMMUNITY)
Admission: EM | Admit: 2020-06-05 | Discharge: 2020-06-10 | DRG: 280 | Disposition: A | Discharge status: Skilled Nursing Facility | Payer: Medicare Other | Attending: Internal Medicine | Admitting: Internal Medicine

## 2020-06-05 ENCOUNTER — Encounter (HOSPITAL_COMMUNITY): Payer: Self-pay | Admitting: Internal Medicine

## 2020-06-05 DIAGNOSIS — Z7952 Long term (current) use of systemic steroids: Secondary | ICD-10-CM

## 2020-06-05 DIAGNOSIS — R079 Chest pain, unspecified: Secondary | ICD-10-CM

## 2020-06-05 DIAGNOSIS — Z79899 Other long term (current) drug therapy: Secondary | ICD-10-CM

## 2020-06-05 DIAGNOSIS — R778 Other specified abnormalities of plasma proteins: Secondary | ICD-10-CM | POA: Diagnosis not present

## 2020-06-05 DIAGNOSIS — I959 Hypotension, unspecified: Secondary | ICD-10-CM | POA: Diagnosis present

## 2020-06-05 DIAGNOSIS — J9612 Chronic respiratory failure with hypercapnia: Secondary | ICD-10-CM | POA: Diagnosis present

## 2020-06-05 DIAGNOSIS — E861 Hypovolemia: Secondary | ICD-10-CM | POA: Diagnosis present

## 2020-06-05 DIAGNOSIS — Z951 Presence of aortocoronary bypass graft: Secondary | ICD-10-CM | POA: Diagnosis not present

## 2020-06-05 DIAGNOSIS — Z91048 Other nonmedicinal substance allergy status: Secondary | ICD-10-CM

## 2020-06-05 DIAGNOSIS — Z515 Encounter for palliative care: Secondary | ICD-10-CM | POA: Diagnosis not present

## 2020-06-05 DIAGNOSIS — E860 Dehydration: Secondary | ICD-10-CM | POA: Diagnosis present

## 2020-06-05 DIAGNOSIS — R296 Repeated falls: Secondary | ICD-10-CM | POA: Diagnosis present

## 2020-06-05 DIAGNOSIS — R0789 Other chest pain: Secondary | ICD-10-CM | POA: Diagnosis not present

## 2020-06-05 DIAGNOSIS — I4510 Unspecified right bundle-branch block: Secondary | ICD-10-CM | POA: Diagnosis present

## 2020-06-05 DIAGNOSIS — Z89611 Acquired absence of right leg above knee: Secondary | ICD-10-CM

## 2020-06-05 DIAGNOSIS — E871 Hypo-osmolality and hyponatremia: Secondary | ICD-10-CM | POA: Diagnosis not present

## 2020-06-05 DIAGNOSIS — L03115 Cellulitis of right lower limb: Secondary | ICD-10-CM | POA: Diagnosis present

## 2020-06-05 DIAGNOSIS — Z66 Do not resuscitate: Secondary | ICD-10-CM | POA: Diagnosis not present

## 2020-06-05 DIAGNOSIS — I5032 Chronic diastolic (congestive) heart failure: Secondary | ICD-10-CM | POA: Diagnosis present

## 2020-06-05 DIAGNOSIS — Z882 Allergy status to sulfonamides status: Secondary | ICD-10-CM

## 2020-06-05 DIAGNOSIS — Z20822 Contact with and (suspected) exposure to covid-19: Secondary | ICD-10-CM | POA: Diagnosis not present

## 2020-06-05 DIAGNOSIS — I110 Hypertensive heart disease with heart failure: Secondary | ICD-10-CM | POA: Diagnosis present

## 2020-06-05 DIAGNOSIS — D509 Iron deficiency anemia, unspecified: Secondary | ICD-10-CM | POA: Diagnosis present

## 2020-06-05 DIAGNOSIS — Z9981 Dependence on supplemental oxygen: Secondary | ICD-10-CM | POA: Diagnosis not present

## 2020-06-05 DIAGNOSIS — Z85828 Personal history of other malignant neoplasm of skin: Secondary | ICD-10-CM

## 2020-06-05 DIAGNOSIS — L89323 Pressure ulcer of left buttock, stage 3: Secondary | ICD-10-CM | POA: Diagnosis not present

## 2020-06-05 DIAGNOSIS — I2510 Atherosclerotic heart disease of native coronary artery without angina pectoris: Secondary | ICD-10-CM | POA: Diagnosis present

## 2020-06-05 DIAGNOSIS — Z888 Allergy status to other drugs, medicaments and biological substances status: Secondary | ICD-10-CM

## 2020-06-05 DIAGNOSIS — I739 Peripheral vascular disease, unspecified: Secondary | ICD-10-CM | POA: Diagnosis present

## 2020-06-05 DIAGNOSIS — I340 Nonrheumatic mitral (valve) insufficiency: Secondary | ICD-10-CM | POA: Diagnosis not present

## 2020-06-05 DIAGNOSIS — I4892 Unspecified atrial flutter: Secondary | ICD-10-CM | POA: Diagnosis not present

## 2020-06-05 DIAGNOSIS — F32A Depression, unspecified: Secondary | ICD-10-CM | POA: Diagnosis present

## 2020-06-05 DIAGNOSIS — Z7189 Other specified counseling: Secondary | ICD-10-CM | POA: Diagnosis not present

## 2020-06-05 DIAGNOSIS — Z87891 Personal history of nicotine dependence: Secondary | ICD-10-CM

## 2020-06-05 DIAGNOSIS — I4891 Unspecified atrial fibrillation: Secondary | ICD-10-CM | POA: Diagnosis present

## 2020-06-05 DIAGNOSIS — M1990 Unspecified osteoarthritis, unspecified site: Secondary | ICD-10-CM | POA: Diagnosis present

## 2020-06-05 DIAGNOSIS — R278 Other lack of coordination: Secondary | ICD-10-CM | POA: Diagnosis not present

## 2020-06-05 DIAGNOSIS — L89152 Pressure ulcer of sacral region, stage 2: Secondary | ICD-10-CM | POA: Diagnosis not present

## 2020-06-05 DIAGNOSIS — M6281 Muscle weakness (generalized): Secondary | ICD-10-CM | POA: Diagnosis not present

## 2020-06-05 DIAGNOSIS — N400 Enlarged prostate without lower urinary tract symptoms: Secondary | ICD-10-CM | POA: Diagnosis present

## 2020-06-05 DIAGNOSIS — E785 Hyperlipidemia, unspecified: Secondary | ICD-10-CM | POA: Diagnosis present

## 2020-06-05 DIAGNOSIS — F29 Unspecified psychosis not due to a substance or known physiological condition: Secondary | ICD-10-CM | POA: Diagnosis not present

## 2020-06-05 DIAGNOSIS — L89322 Pressure ulcer of left buttock, stage 2: Secondary | ICD-10-CM | POA: Diagnosis present

## 2020-06-05 DIAGNOSIS — J449 Chronic obstructive pulmonary disease, unspecified: Secondary | ICD-10-CM | POA: Diagnosis present

## 2020-06-05 DIAGNOSIS — I214 Non-ST elevation (NSTEMI) myocardial infarction: Principal | ICD-10-CM | POA: Diagnosis present

## 2020-06-05 DIAGNOSIS — E782 Mixed hyperlipidemia: Secondary | ICD-10-CM

## 2020-06-05 DIAGNOSIS — Z789 Other specified health status: Secondary | ICD-10-CM | POA: Diagnosis not present

## 2020-06-05 DIAGNOSIS — J9611 Chronic respiratory failure with hypoxia: Secondary | ICD-10-CM | POA: Diagnosis present

## 2020-06-05 DIAGNOSIS — I21A1 Myocardial infarction type 2: Secondary | ICD-10-CM | POA: Diagnosis not present

## 2020-06-05 DIAGNOSIS — R2681 Unsteadiness on feet: Secondary | ICD-10-CM | POA: Diagnosis not present

## 2020-06-05 DIAGNOSIS — Z7982 Long term (current) use of aspirin: Secondary | ICD-10-CM

## 2020-06-05 DIAGNOSIS — Z880 Allergy status to penicillin: Secondary | ICD-10-CM

## 2020-06-05 DIAGNOSIS — I480 Paroxysmal atrial fibrillation: Secondary | ICD-10-CM | POA: Diagnosis present

## 2020-06-05 DIAGNOSIS — I219 Acute myocardial infarction, unspecified: Secondary | ICD-10-CM

## 2020-06-05 DIAGNOSIS — K219 Gastro-esophageal reflux disease without esophagitis: Secondary | ICD-10-CM | POA: Diagnosis present

## 2020-06-05 DIAGNOSIS — I499 Cardiac arrhythmia, unspecified: Secondary | ICD-10-CM | POA: Diagnosis not present

## 2020-06-05 DIAGNOSIS — Z881 Allergy status to other antibiotic agents status: Secondary | ICD-10-CM

## 2020-06-05 DIAGNOSIS — H9190 Unspecified hearing loss, unspecified ear: Secondary | ICD-10-CM | POA: Diagnosis present

## 2020-06-05 DIAGNOSIS — T502X5A Adverse effect of carbonic-anhydrase inhibitors, benzothiadiazides and other diuretics, initial encounter: Secondary | ICD-10-CM | POA: Diagnosis present

## 2020-06-05 DIAGNOSIS — F411 Generalized anxiety disorder: Secondary | ICD-10-CM | POA: Diagnosis present

## 2020-06-05 DIAGNOSIS — I361 Nonrheumatic tricuspid (valve) insufficiency: Secondary | ICD-10-CM | POA: Diagnosis not present

## 2020-06-05 DIAGNOSIS — I469 Cardiac arrest, cause unspecified: Secondary | ICD-10-CM | POA: Diagnosis not present

## 2020-06-05 DIAGNOSIS — Z8249 Family history of ischemic heart disease and other diseases of the circulatory system: Secondary | ICD-10-CM

## 2020-06-05 DIAGNOSIS — Z7951 Long term (current) use of inhaled steroids: Secondary | ICD-10-CM

## 2020-06-05 HISTORY — DX: Unspecified atrial fibrillation: I48.91

## 2020-06-05 LAB — HEPATIC FUNCTION PANEL
ALT: 22 U/L (ref 0–44)
AST: 29 U/L (ref 15–41)
Albumin: 3.3 g/dL — ABNORMAL LOW (ref 3.5–5.0)
Alkaline Phosphatase: 69 U/L (ref 38–126)
Bilirubin, Direct: 0.1 mg/dL (ref 0.0–0.2)
Indirect Bilirubin: 0.4 mg/dL (ref 0.3–0.9)
Total Bilirubin: 0.5 mg/dL (ref 0.3–1.2)
Total Protein: 5.4 g/dL — ABNORMAL LOW (ref 6.5–8.1)

## 2020-06-05 LAB — CBC
HCT: 29.4 % — ABNORMAL LOW (ref 39.0–52.0)
Hemoglobin: 9.2 g/dL — ABNORMAL LOW (ref 13.0–17.0)
MCH: 30.6 pg (ref 26.0–34.0)
MCHC: 31.3 g/dL (ref 30.0–36.0)
MCV: 97.7 fL (ref 80.0–100.0)
Platelets: 272 10*3/uL (ref 150–400)
RBC: 3.01 MIL/uL — ABNORMAL LOW (ref 4.22–5.81)
RDW: 15.5 % (ref 11.5–15.5)
WBC: 17.1 10*3/uL — ABNORMAL HIGH (ref 4.0–10.5)
nRBC: 0 % (ref 0.0–0.2)

## 2020-06-05 LAB — URINALYSIS, COMPLETE (UACMP) WITH MICROSCOPIC
Bilirubin Urine: NEGATIVE
Glucose, UA: NEGATIVE mg/dL
Hgb urine dipstick: NEGATIVE
Ketones, ur: NEGATIVE mg/dL
Nitrite: NEGATIVE
Protein, ur: NEGATIVE mg/dL
Specific Gravity, Urine: 1.014 (ref 1.005–1.030)
WBC, UA: >50 WBC/hpf — ABNORMAL HIGH (ref 0–5)
pH: 5 (ref 5.0–8.0)

## 2020-06-05 LAB — BASIC METABOLIC PANEL
Anion gap: 12 (ref 5–15)
BUN: 21 mg/dL (ref 8–23)
CO2: 31 mmol/L (ref 22–32)
Calcium: 9.2 mg/dL (ref 8.9–10.3)
Chloride: 87 mmol/L — ABNORMAL LOW (ref 98–111)
Creatinine, Ser: 1.32 mg/dL — ABNORMAL HIGH (ref 0.61–1.24)
GFR, Estimated: 52 mL/min — ABNORMAL LOW (ref >60)
Glucose, Bld: 138 mg/dL — ABNORMAL HIGH (ref 70–99)
Potassium: 3.6 mmol/L (ref 3.5–5.1)
Sodium: 130 mmol/L — ABNORMAL LOW (ref 135–145)

## 2020-06-05 LAB — SODIUM, URINE, RANDOM: Sodium, Ur: <10 mmol/L

## 2020-06-05 LAB — PROCALCITONIN: Procalcitonin: 0.19 ng/mL

## 2020-06-05 LAB — LACTIC ACID, PLASMA: Lactic Acid, Venous: 1.2 mmol/L (ref 0.5–1.9)

## 2020-06-05 LAB — MAGNESIUM: Magnesium: 2 mg/dL (ref 1.7–2.4)

## 2020-06-05 LAB — CREATININE, URINE, RANDOM: Creatinine, Urine: 101.89 mg/dL

## 2020-06-05 LAB — OSMOLALITY, URINE: Osmolality, Ur: 339 mOsm/kg (ref 300–900)

## 2020-06-05 LAB — BRAIN NATRIURETIC PEPTIDE: B Natriuretic Peptide: 473.5 pg/mL — ABNORMAL HIGH (ref 0.0–100.0)

## 2020-06-05 LAB — TROPONIN I (HIGH SENSITIVITY)
Troponin I (High Sensitivity): 287 ng/L (ref <18)
Troponin I (High Sensitivity): 426 ng/L (ref <18)

## 2020-06-05 MED ORDER — LACTATED RINGERS IV BOLUS
500.0000 mL | Freq: Once | INTRAVENOUS | Status: AC
  Administered 2020-06-05: 500 mL via INTRAVENOUS

## 2020-06-05 MED ORDER — ASPIRIN EC 81 MG PO TBEC
81.0000 mg | DELAYED_RELEASE_TABLET | Freq: Every day | ORAL | Status: DC
  Administered 2020-06-06 – 2020-06-10 (×5): 81 mg via ORAL
  Filled 2020-06-05 (×5): qty 1

## 2020-06-05 MED ORDER — UMECLIDINIUM BROMIDE 62.5 MCG/INH IN AEPB
1.0000 | INHALATION_SPRAY | Freq: Every day | RESPIRATORY_TRACT | Status: DC
  Filled 2020-06-05: qty 7

## 2020-06-05 MED ORDER — MOMETASONE FURO-FORMOTEROL FUM 200-5 MCG/ACT IN AERO
2.0000 | INHALATION_SPRAY | Freq: Two times a day (BID) | RESPIRATORY_TRACT | Status: DC
  Filled 2020-06-05: qty 8.8

## 2020-06-05 MED ORDER — SODIUM CHLORIDE 0.9 % IV SOLN: INTRAVENOUS | Status: DC

## 2020-06-05 MED ORDER — LACTATED RINGERS IV SOLN: INTRAVENOUS | Status: DC

## 2020-06-05 MED ORDER — PREDNISONE 10 MG PO TABS
10.0000 mg | ORAL_TABLET | Freq: Every day | ORAL | Status: DC
  Administered 2020-06-06 – 2020-06-10 (×5): 10 mg via ORAL
  Filled 2020-06-05 (×5): qty 1

## 2020-06-05 MED ORDER — FLUTICASONE PROPIONATE 50 MCG/ACT NA SUSP
1.0000 | Freq: Every day | NASAL | Status: DC
  Administered 2020-06-06: 1 via NASAL
  Administered 2020-06-07 – 2020-06-10 (×4): 2 via NASAL
  Filled 2020-06-05: qty 16

## 2020-06-05 MED ORDER — ACETAMINOPHEN 325 MG PO TABS
650.0000 mg | ORAL_TABLET | Freq: Four times a day (QID) | ORAL | Status: DC | PRN
  Administered 2020-06-06 – 2020-06-09 (×2): 650 mg via ORAL
  Filled 2020-06-05 (×2): qty 2

## 2020-06-05 MED ORDER — HEPARIN BOLUS VIA INFUSION
3000.0000 [IU] | Freq: Once | INTRAVENOUS | Status: AC
  Administered 2020-06-05: 3000 [IU] via INTRAVENOUS
  Filled 2020-06-05: qty 3000

## 2020-06-05 MED ORDER — ACETAMINOPHEN 650 MG RE SUPP: 650.0000 mg | Freq: Four times a day (QID) | RECTAL | Status: DC | PRN

## 2020-06-05 MED ORDER — ALBUTEROL SULFATE (2.5 MG/3ML) 0.083% IN NEBU
2.5000 mg | INHALATION_SOLUTION | RESPIRATORY_TRACT | Status: DC | PRN
  Filled 2020-06-05: qty 3

## 2020-06-05 MED ORDER — VITAMIN B-12 1000 MCG PO TABS
1000.0000 ug | ORAL_TABLET | Freq: Every day | ORAL | Status: DC
  Administered 2020-06-06 – 2020-06-10 (×5): 1000 ug via ORAL
  Filled 2020-06-05 (×6): qty 1

## 2020-06-05 MED ORDER — FINASTERIDE 5 MG PO TABS
5.0000 mg | ORAL_TABLET | Freq: Every day | ORAL | Status: DC
  Administered 2020-06-06 – 2020-06-09 (×4): 5 mg via ORAL
  Filled 2020-06-05 (×5): qty 1

## 2020-06-05 MED ORDER — LEVALBUTEROL TARTRATE 45 MCG/ACT IN AERO: 2.0000 | INHALATION_SPRAY | RESPIRATORY_TRACT | Status: DC | PRN

## 2020-06-05 MED ORDER — POTASSIUM CHLORIDE CRYS ER 20 MEQ PO TBCR
40.0000 meq | EXTENDED_RELEASE_TABLET | Freq: Once | ORAL | Status: AC
  Administered 2020-06-05: 40 meq via ORAL
  Filled 2020-06-05: qty 2

## 2020-06-05 MED ORDER — NITROGLYCERIN 0.4 MG SL SUBL
0.4000 mg | SUBLINGUAL_TABLET | SUBLINGUAL | Status: DC | PRN
  Administered 2020-06-09: 0.4 mg via SUBLINGUAL
  Filled 2020-06-05: qty 1

## 2020-06-05 MED ORDER — BUDESONIDE-FORMOTEROL FUMARATE 160-4.5 MCG/ACT IN AERO
2.0000 | INHALATION_SPRAY | Freq: Two times a day (BID) | RESPIRATORY_TRACT | Status: DC
  Administered 2020-06-05 – 2020-06-06 (×2): 2 via RESPIRATORY_TRACT

## 2020-06-05 MED ORDER — HEPARIN (PORCINE) 25000 UT/250ML-% IV SOLN
750.0000 [IU]/h | INTRAVENOUS | Status: DC
  Administered 2020-06-05: 650 [IU]/h via INTRAVENOUS
  Filled 2020-06-05: qty 1000

## 2020-06-05 MED ORDER — ATORVASTATIN CALCIUM 40 MG PO TABS
40.0000 mg | ORAL_TABLET | Freq: Every day | ORAL | Status: DC
  Administered 2020-06-05 – 2020-06-09 (×5): 40 mg via ORAL
  Filled 2020-06-05 (×5): qty 1

## 2020-06-05 MED ORDER — ASPIRIN 81 MG PO CHEW
324.0000 mg | CHEWABLE_TABLET | Freq: Once | ORAL | Status: AC
  Administered 2020-06-05: 324 mg via ORAL
  Filled 2020-06-05: qty 4

## 2020-06-05 NOTE — H&P (Signed)
History and Physical    PLEASE NOTE THAT DRAGON DICTATION SOFTWARE WAS USED IN THE CONSTRUCTION OF THIS NOTE.   [name]:[id number] DOB: [birth year] DOA: 06/05/2020  PCP: [name], MD Patient coming from: home   I have personally briefly reviewed patient's old medical records in [place]  Chief Complaint: Chest pain  HPI: [name] is a 87 y.o. male with medical history significant for coronary artery disease status post CABG in 3500, chronic diastolic heart failure, chronic hypoxic respiratory failure on continuous 2 L nasal cannula, severe COPD on chronic prednisone therapy, peripheral vascular disease status post right AKA, hypertension, hyperlipidemia, GERD, BPH, paroxysmal atrial fibrillation not chronically anticoagulated, chronic hyponatremia with baseline serum sodium of 1 28-1 32, chronic normocytic anemia with baseline hemoglobin 7.5-10, who is admitted to [facility] on 06/05/2020 with NSTEMI after presenting from home to [facility] ED complaining of chest pain.   The following history is provided via my discussions with the patient as well as with his wife, who is present at bedside, in addition to my discussions with the emergency department physician and via chart review.  The patient awoke from sleep between 7 AM and 8 AM this morning with left-sided chest pressure radiating into the right portion of the chest as well as into the bilateral arms, but denied any radiation to the back or between the scapula.  This pain was constant over the next 15 to 20 minutes, before completely resolving after 2 sublingual nitroglycerin, without any subsequent return of this discomfort.  The patient reiterates that he has remained chest pain-free since this episode of chest discomfort this morning.  Episode of chest pain this morning was nonpleuritic, nonpositional, and not reproducible with palpation of the anterior chest wall.  He conveys that he did not get out of bed  throughout the 15 to 20 minutes during which he was experiencing the chest discomfort, and therefore is on able to evaluate for an exertional component to this chest discomfort.  He denies any associated shortness of breath, palpitations, diaphoresis, nausea, vomiting, dizziness, presyncope, or syncope.  He reports that he has mild baseline shortness of breath in the setting of severe steroid-dependent COPD, but denies any recent worsening thereof.  Otherwise, following resolution of his chest discomfort earlier this morning, the patient has been completely asymptomatic.   He denies any recent trauma, travel.  No known history of underlying malignancy.  No personal history of DVT/PE.  Denies any recent hemoptysis or wheezing. he conveys chronic venous stasis wounds in the left lower extremity, but reports good compliance with outpatient wound care, and denies any recent worsening of these wounds.  Specifically, he denies any recent left lower extremity erythema, increased warmth, tenderness, or acute worsening of edema.  He also denies any left lower extremity calf tenderness.  In the setting of a documented history of peripheral vascular disease, the patient underwent AKA of the right lower extremity in August 2021, with ensuing revision of such in January 2022.   He has a history of paroxysmal atrial fibrillation for which she is not chronically anticoagulated in the setting of recurrent falls.  Rather, he is on a daily baby aspirin in the setting of a history of coronary artery disease , but otherwise denies use of any blood thinners as an outpatient.  Reports good compliance with outpatient Toprol-XL.   Per chart review, the patient was is a history of chronic diastolic heart failure, with most recent echocardiogram performed  on 01/28/2020, and demonstrated LVEF 60 to 65%, no evidence of focal wall motion maladies, mild concentric LVH, indeterminate diastolic function, and no evidence of significant  valvular pathology.  He reports compliance with his home diuretic regimen which consists of Lasix 40 mg p.o. daily.  In the setting of a history of coronary artery disease, patient underwent CABG in 2003, with LIMA to LAD, SVG to OM1-2, SVG to RCA and PDA.  He does not recall the timing of his most recent coronary angiography or stress testing.  Conveys good compliance with his home daily baby aspirin.  Additionally, he is on atorvastatin 20 mg p.o. daily.   Denies any recent subjective fever, chills, rigors, or generalized myalgias.  No recent headache, neck stiffness, worsening of baseline rhinitis/rhinorrhea, sore throat, cough, abdominal pain, diarrhea, or rash.  Denies any known recent COVID-19 exposures.  He also denies any dysuria, gross hematuria, or change in urinary urgency/frequency.  In the setting of history of chronic normocytic anemia, with baseline hemoglobin of 7.5-10, the patient's wife conveys that the patient was recently started on oral iron supplementation.  Within a few days of initiating supplementation, the patient noted development of coloration associated with his stool in the absence of any hematochezia.  The patient's stool was subsequently found to be Hemoccult negative per outpatient evaluation via his PCP.      ED Course:  Vital signs in the ED were notable for the following: Tetramex 99.2, heart rate 93-1 08; presenting blood pressure 90/51, which subsequently decreased to 88/51, performed proving to most recent blood pressure 108/66 following initiation of IV fluids, as further detailed below; respiratory rate 14-20, oxygen saturation 99 to 100% on baseline 2 L continuous nasal cannula.  Labs were notable for the following: BMP notable for the following: Sodium 130 compared to most recent prior value 129 on 04/10/2020, potassium 3.6, chloride 87, carbonate 31, creatinine 1.32 relative to 1.1 on 04/10/2020, glucose 138.  Serum magnesium level 2.0.  CBC notable for lipid  cell count of 17,000, hemoglobin 9.2 relative to most recent prior value of 7.6 on 04/2020.  BNP 473 compared to most recent prior value of 334 on February 2022, high-sensitivity troponin I noted to be 287, relative to most recent prior of 29 when checked in February 2022.  Nasopharyngeal COVID-19/influenza PCR was performed in the emergency department this evening, with result currently pending.  EKG, in comparison to most recent prior from 04/10/2018 [facility], showed sinus rhythm with multiple PVCs, ventricular rate 95, right bundle branch block, and no evidence of interval T wave or ST changes, as most recent prior EKG at also showed ST depression in leads V5 and V6, with no evidence of ST elevation.  Chest x-ray showed no evidence of acute cardiopulmonary process.  Patient's case, imaging, EKG were discussed with the on-call cardiologist, Dr. Neena Rhymes, formally consulted, with associated recommendations, as further detailed below.  While in the ED, the following were administered: Aspirin 325 mg p.o. x1, heparin drip initiated, and 500 cc lactated Ringer bolus.     Review of Systems: As per HPI otherwise 10 point review of systems negative.   Past Medical History:  Diagnosis Date  . Anxiety   . Arthritis    "generalized" (04/04/2017)  . Asthma   . Basal cell carcinoma    "left ear"  . BPH (benign prostatic hyperplasia)    severe; s/p multiple biopsies  . CAD (coronary artery disease)   . Carotid artery disease ([place])   . Chronic  diastolic CHF (congestive heart failure) ([place]) 2019  . Chronic pain 04/08/2020  . Chronic respiratory failure ([place])   . Chronic rhinitis   . COPD (chronic obstructive pulmonary disease) (HCC)    2L [place] O2  . Depression   . Dilation of biliary tract   . Elevated troponin 03/09/2017  . Fall 04/08/2020  . Gallstones   . GERD (gastroesophageal reflux disease)   . History of blood transfusion    "w/his CABG" (04/04/2017)  . History of kidney stones   .  Hyperlipidemia   . Hypertension   . On home oxygen therapy    "~ 24/7" (04/04/2017)  . Peripheral vascular disease ([place])   . Pneumonia 2019  . RBBB   . Squamous cell carcinoma of skin 04/23/2013   in situ-Right flank (txpbx)  . Squamous cell carcinoma of skin 01/23/2017   [place]/ in siut- Left ear rim (CX3FU)  . Syncope and collapse   . Ureteral tumor 08/2015   had endoscopic procedure for evaluation, unable to reach for biopsy  . Urinary catheter (Foley) change required     Past Surgical History:  Procedure Laterality Date  . ABDOMINAL AORTOGRAM W/LOWER EXTREMITY Bilateral 06/30/2019   Procedure: ABDOMINAL AORTOGRAM W/LOWER EXTREMITY;  Surgeon: Waynetta Sandy, MD;  Location: [place] [facility];  Service: Cardiovascular;  Laterality: Bilateral;  . AMPUTATION Right 08/11/2019   Procedure: RIGHT ABOVE KNEE AMPUTATION;  Surgeon: Angelia Mould, MD;  Location: [place];  Service: Vascular;  Laterality: Right;  . AMPUTATION Right 01/27/2020   Procedure: RIGHT AMPUTATION ABOVE KNEE REVISION;  Surgeon: Waynetta Sandy, MD;  Location: [place];  Service: Vascular;  Laterality: Right;  . BASAL CELL CARCINOMA EXCISION Left    ear  . CARDIAC CATHETERIZATION     "prior to bypass"  . CATARACT EXTRACTION W/ INTRAOCULAR LENS  IMPLANT, BILATERAL Bilateral   . CORONARY ARTERY BYPASS GRAFT  10/2001   LIMA to LAD, SVG to OM1-2, SVG to RCA and PDA  . LITHOTRIPSY    . PROSTATE BIOPSY  10/2012   had bleeding after surgery.  . TONSILLECTOMY    . TRANSURETHRAL RESECTION OF BLADDER TUMOR N/A 04/06/2020   Procedure: TRANSURETHRAL RESECTION OF BLADDER TUMOR (TURBT)/ POST OPERATIVE INSTILLATION OF GEMCITABINE;  Surgeon: Festus Aloe, MD;  Location: [facility];  Service: Urology;  Laterality: N/A;    Social History:  reports that he quit smoking about 38 years ago. His smoking use included cigarettes. He has a 66.00 pack-year smoking history. He has never used smokeless tobacco. He  reports current alcohol use. He reports that he does not use drugs.   Allergies  Allergen Reactions  . Cefdinir Diarrhea and Other (See Comments)    Severe Diarrhea  . Nitrofurantoin Swelling and Other (See Comments)    Hand Swelling  . Sulfa Antibiotics Anaphylaxis and Swelling  . Sulfonamide Derivatives Swelling and Other (See Comments)    Facial/tongue swelling  . Tape Other (See Comments)    SKIN IS VERY THIN AND TEARS EASILY!!!!! Please do NOT use "plastic" tape- USE PAPER!!  . Ciprofloxacin Itching, Rash and Other (See Comments)    Red itchy hands  . Nitrofurantoin Swelling and Other (See Comments)    Swollen hands  . Amoxicillin Er Other (See Comments)    Frequest urination. Pt reports "not really" allergic 01/24/20  . Doxycycline Other (See Comments)    "Felt terrible" Pt wife reports "not really" allergic 01/24/20  . Keflex [Cephalexin] Other (See Comments)    Pt  does not recall reaction   . Mirtazapine Other (See Comments)    Pt does not recall reaction  . Ranolazine Other (See Comments)    Inability to ambulate   . Chlorhexidine Gluconate Rash  . Levaquin [Levofloxacin] Itching and Rash  . Sertraline Anxiety and Other (See Comments)    Makes the patient jittery    Family History  Problem Relation Age of Onset  . Heart disease Mother   . Heart disease Father        Before age 97  . Breast cancer Mother   . Cancer Mother        Breast cancer  . Hypertension Mother   . Other Mother        AAA  and   Amputation  . Heart attack Father   . Stroke Brother        x3, still living   . Peripheral vascular disease Brother   . Heart disease Brother   . Hyperlipidemia Brother   . Hypertension Brother   . Heart disease Brother   . Allergies Brother     Family history reviewed and not pertinent    Prior to Admission medications   Medication Sig Start Date End Date Taking? Authorizing Provider  acetaminophen (TYLENOL) 500 MG tablet Take 500 mg by mouth every 6  (six) hours as needed for moderate pain.   Yes [provider]  ALPRAZolam Duanne Moron) 0.5 MG tablet Take 0.5 mg by mouth 3 (three) times daily as needed for anxiety. 03/05/20  Yes [provider]  aspirin 81 MG tablet Take 81 mg by mouth daily.   Yes [provider]  atorvastatin (LIPITOR) 20 MG tablet Take 20 mg by mouth at bedtime. 03/19/20  Yes [provider]  calcium carbonate (TUMS - DOSED IN MG ELEMENTAL CALCIUM) 500 MG chewable tablet Chew 1 tablet (200 mg of elemental calcium total) by mouth 2 (two) times daily as needed for indigestion or heartburn. 09/01/19  Yes Love, Ivan Anchors, PA-C  Cholecalciferol (VITAMIN D3) 50 MCG (2000 UT) TABS Take 2,000 Units by mouth daily with lunch. 02/16/20  Yes Angiulli, Lavon Paganini, PA-C  COLLAGEN MATRIX-SILVER EX Apply 1 tablet topically every other day.   Yes [provider]  famotidine (PEPCID) 20 MG tablet Take 20 mg by mouth at bedtime as needed for heartburn or indigestion.   Yes [provider]  finasteride (PROSCAR) 5 MG tablet Take 1 tablet (5 mg total) by mouth daily with lunch. 02/16/20  Yes Angiulli, Lavon Paganini, PA-C  FINASTERIDE PO Take 1 tablet by mouth daily.   Yes [provider]  FLUoxetine (PROZAC) 10 MG capsule Take 10 mg by mouth at bedtime. 05/26/20  Yes [provider]  fluticasone (FLONASE) 50 MCG/ACT nasal spray Place 1-2 sprays into both nostrils daily.   Yes [provider]  furosemide (LASIX) 20 MG tablet Take 40-80 mg by mouth See admin instructions. In 20mg  tablets, take 40mg  daily and 80mg  PRN   Yes [provider]  HYDROcodone-acetaminophen (NORCO/VICODIN) 5-325 MG tablet Take 1 tablet by mouth every 4 (four) hours as needed for moderate pain. 02/16/20  Yes Angiulli, Lavon Paganini, PA-C  levalbuterol [facility] HFA) 45 MCG/ACT inhaler Inhale 2 puffs into the lungs every 4 (four) hours as needed for wheezing or shortness of breath. 02/16/20  Yes Angiulli, Lavon Paganini, PA-C   levalbuterol (XOPENEX) 0.63 MG/3ML nebulizer solution Take 3 mLs (0.63 mg total) by nebulization every 4 (four) hours as needed for wheezing  or shortness of breath. 10/13/19  Yes Tanda Rockers, MD  Metoprolol Succinate (TOPROL XL PO) Take 1 tablet by mouth daily.   Yes [provider]  mometasone (NASONEX) 50 MCG/ACT nasal spray 1-2 sprays to each nostril twice daily, but pre treat with afrin 2 minutes prior to using x 5 days, then stop afrin for 5 days before reusing. 05/07/20  Yes Tanda Rockers, MD  Multiple Vitamins-Minerals (PRESERVISION AREDS 2) CAPS Take 1 capsule by mouth daily.   Yes [provider]  NITROSTAT 0.4 MG SL tablet Place 0.4 mg under the tongue every 5 (five) minutes as needed for chest pain.   Yes [provider]  Polyethyl Glycol-Propyl Glycol (SYSTANE) 0.4-0.3 % SOLN Apply 1 drop to eye 2 (two) times daily as needed (eye irritation). Patient taking differently: Apply 1 drop to eye in the morning, at noon, and at bedtime. 09/01/19  Yes Love, Ivan Anchors, PA-C  polyethylene glycol (MIRALAX / GLYCOLAX) 17 g packet Take 17 g by mouth daily. Patient taking differently: Take 17 g by mouth daily as needed for moderate constipation. 09/01/19  Yes Love, Ivan Anchors, PA-C  predniSONE (DELTASONE) 10 MG tablet Take 1 tablet (10 mg total) by mouth daily. 05/06/20  Yes Tanda Rockers, MD  sodium chloride (MURO 128) 5 % ophthalmic solution Place 1 drop into both eyes 4 (four) times daily. 08/22/19  Yes Love, Ivan Anchors, PA-C  SPIRIVA RESPIMAT 2.5 MCG/ACT AERS INHALE 2 PUFFS BY MOUTH EVERY DAY Patient taking differently: Take 2 puffs by mouth daily. 05/17/20  Yes Tanda Rockers, MD  SYMBICORT 160-4.5 MCG/ACT inhaler Inhale 2 puffs into the lungs 2 (two) times daily. 02/16/20  Yes Angiulli, Lavon Paganini, PA-C  vitamin B-12 (CYANOCOBALAMIN) 1000 MCG tablet Take 1 tablet (1,000 mcg total) by mouth daily. 02/16/20  Yes Angiulli, Lavon Paganini, PA-C  Vitamins A & D (SWEEN EX) Apply 1  application topically daily.   Yes [provider]  alfuzosin (UROXATRAL) 10 MG 24 hr tablet Take 1 tablet (10 mg total) by mouth daily with breakfast. Patient not taking: Reported on 06/05/2020 04/06/20   Festus Aloe, MD  furosemide (LASIX) 40 MG tablet Take 1 tablet (40 mg total) by mouth daily. May take up to 80 mg as needed. Patient not taking: Reported on 06/05/2020 03/01/20   Burtis Junes, NP  guaiFENesin (MUCINEX) 600 MG 12 hr tablet Take 1 tablet (600 mg total) by mouth 2 (two) times daily. Patient not taking: No sig reported 02/16/20   Angiulli, Lavon Paganini, PA-C  OXYGEN Inhale 2 L/min into the lungs continuous.     [provider]  pantoprazole (PROTONIX) 40 MG tablet TAKE 1 TABLET(40 MG) BY MOUTH DAILY 30 TO 60 MINUTES BEFORE FIRST MEAL OF THE DAY Patient not taking: No sig reported 02/16/20   Cathlyn Parsons, PA-C     Objective    Physical Exam: Vitals:   06/05/20 1630 06/05/20 1700 06/05/20 1730 06/05/20 1800  BP: 111/65 113/66 (!) 98/56 (!) 88/51  Pulse: 96 93 (!) 126 (!) 108  Resp: 14 11 17 20   Temp:      SpO2: 98% 100% 100% 100%    General: appears to be stated age; alert, oriented Skin: warm, dry, no rash Head:  AT/[place] Mouth:  Oral mucosa membranes appear dry, normal dentition Neck: supple; trachea midline Heart:  RRR; did not appreciate any M/R/G Lungs: CTAB, did not appreciate any wheezes, rales, or rhonchi Abdomen: + BS; soft, ND,  NT Vascular: 2+ pedal pulses LLE ; 2+ radial pulses LLE  Extremities: Right AKA noted, no crepitus or erythema associated with left lower extremity..  Neuro: strength and sensation intact in upper extremities b/l as well as left lower extremity.     Labs on Admission: I have personally reviewed following labs and imaging studies  CBC: Recent Labs  Lab 06/05/20 1600  WBC 17.1*  HGB 9.2*  HCT 29.4*  MCV 97.7  PLT 315   Basic Metabolic Panel: Recent Labs  Lab 06/05/20 1600  NA 130*  K 3.6  CL 87*   CO2 31  GLUCOSE 138*  BUN 21  CREATININE 1.32*  CALCIUM 9.2  MG 2.0   GFR: CrCl cannot be calculated (Unknown ideal weight.). Liver Function Tests: No results for input(s): AST, ALT, ALKPHOS, BILITOT, PROT, ALBUMIN in the last 168 hours. No results for input(s): LIPASE, AMYLASE in the last 168 hours. No results for input(s): AMMONIA in the last 168 hours. Coagulation Profile: No results for input(s): INR, PROTIME in the last 168 hours. Cardiac Enzymes: No results for input(s): CKTOTAL, CKMB, CKMBINDEX, TROPONINI in the last 168 hours. BNP (last 3 results) No results for input(s): PROBNP in the last 8760 hours. HbA1C: No results for input(s): HGBA1C in the last 72 hours. CBG: No results for input(s): GLUCAP in the last 168 hours. Lipid Profile: No results for input(s): CHOL, HDL, LDLCALC, TRIG, CHOLHDL, LDLDIRECT in the last 72 hours. Thyroid Function Tests: No results for input(s): TSH, T4TOTAL, FREET4, T3FREE, THYROIDAB in the last 72 hours. Anemia Panel: No results for input(s): VITAMINB12, FOLATE, FERRITIN, TIBC, IRON, RETICCTPCT in the last 72 hours. Urine analysis:    Component Value Date/Time   COLORURINE YELLOW 01/20/2020 0125   APPEARANCEUR HAZY (A) 01/20/2020 0125   LABSPEC 1.010 01/20/2020 0125   PHURINE 6.0 01/20/2020 0125   GLUCOSEU NEGATIVE 01/20/2020 0125   HGBUR NEGATIVE 01/20/2020 0125   BILIRUBINUR NEGATIVE 01/20/2020 0125   KETONESUR NEGATIVE 01/20/2020 0125   PROTEINUR NEGATIVE 01/20/2020 0125   NITRITE POSITIVE (A) 01/20/2020 0125   LEUKOCYTESUR LARGE (A) 01/20/2020 0125    Radiological Exams on Admission: DG Chest 2 View  Result Date: 06/05/2020 CLINICAL DATA:  Chest pain. EXAM: CHEST - 2 VIEW COMPARISON:  02/08/2020 and older studies. FINDINGS: Stable changes from prior CABG surgery. Cardiac silhouette is normal in size and configuration. No mediastinal or hilar masses. No adenopathy. Lungs are hyperexpanded. Mild interstitial prominence most  evident at the bases. Lungs otherwise clear. No pleural effusion or pneumothorax. Skeletal structures are demineralized, but intact. IMPRESSION: 1. No acute cardiopulmonary disease. 2. COPD. Electronically Signed   By: Lajean Manes M.D.   On: 06/05/2020 17:29     EKG: Independently reviewed, with result as described above.    Assessment/Plan    [name] is a 87 y.o. male with medical history significant for coronary artery disease status post CABG in 1761, chronic diastolic heart failure, chronic hypoxic respiratory failure on continuous 2 L nasal cannula, severe COPD on chronic prednisone therapy, peripheral vascular disease status post right AKA, hypertension, hyperlipidemia, GERD, BPH, paroxysmal atrial fibrillation not chronically anticoagulated, chronic hyponatremia with baseline serum sodium of 1 28-1 32, chronic normocytic anemia with baseline hemoglobin 7.5-10, who is admitted to [facility] on 06/05/2020 with NSTEMI after presenting from home to [facility] ED complaining of chest pain.   Principal Problem:   NSTEMI (non-ST elevated myocardial infarction) ([place]) Active Problems:   Hyperlipidemia   COPD   GOLD  IV/ 02/steroid dep    Chronic respiratory failure with hypoxia and hypercapnia (HCC)   Elevated troponin   Chronic diastolic CHF (congestive heart failure) (HCC)   Hypotension   Atrial fibrillation ([place])     #) NSTEMI: Diagnosis on the basis of episode of left-sided chest pressure earlier this morning lasting 15 to 20 minutes before complete resolution after 2 doses of sublingual nitroglycerin, with presenting high-sensitivity troponin I noted to be elevated to 87 relative to most recent prior value of 29 in February 2022.  Presenting EKG, in comparison to most recent prior from 04/09/2020, showed sinus rhythm with multiple PVCs, ventricular rate 95, and no evidence of interval T wave or ST changes, noting that the ST depression observed in the 5, V6, was also noted to be  present on this most recent prior EKG. no evidence of ST elevation.  The patient has received a full dose aspirin.  On-call cardiologist, Dr. Neena Rhymes, has been consulted and evaluated the patient with ensuing recommendations for initiation of heparin drip, further trending of serial troponin, and echocardiogram to occur in the morning.  As the patient is already received his daily Toprol, and in the context of soft blood pressures, Dr. Neena Rhymes recommends holding additional doses of his beta-blocker for now, while engaging in gentle IV fluid resuscitation and holding home Lasix for now.  Prior to this consult, no indication for urgent coronary angiography at this time. additionally, will increase home atorvastatin 20 mg p.o. nightly to high intensity dosing of 40 mg p.o. daily in the setting of known history of CAD as well as for anti-inflammatory/plaque stabilization qualities, with first dose to occur now.  Refraining from ACE inhibitor/ARB in the setting of soft blood pressures. patient currently chest pain-free.  Per cardiology consult, will resume home daily baby aspirin tomorrow morning.  Plan: Cardiology formally consulted.  Initiate heparin drip.  Resume home daily baby aspirin tomorrow morning.  Hold home beta-blocker for now in the setting of borderline hypotension, as above.  Additionally, will refrain from ACE inhibitor/ARB on the same borderline hypotensive Primis.  Increase dose of home atorvastatin to 40 mg p.o. nightly, with first dose to occur now.  Monitor on telemetry.  Monitor continuous pulse oximetry.  Continue to trend serial troponin values.  Add on serum magnesium level, with as needed supplementation in order to maintain these levels greater than or equal to 2.0 in order to decrease chance of ventricular arrhythmia.  Potassium chloride 40 mill equivalents p.o. x1 in order to maintain serum potassium level of greater than or equal to 4.0.  As needed sublingual nitroglycerin.  As  needed EKG for any additional episodes of chest pain.  Echocardiogram is been ordered for the morning.      #) Hypotension: Presenting blood pressure 90/51, which ultimately decreased to a level of 88/51, with ensuing IV fluid responsiveness, with ensuing improvement to most recent blood pressure of 108/66.  Given this response to IV fluids, hypovolemia in the setting of dehydration is a possibility.  In terms of other potential hypovolemic contributions, the patient's presenting hemoglobin of 9.2 is consistent with his baseline range in setting of chronic normocytic anemia, with wife conveying that the patient recently underwent Hemoccult stool testing as an outpatient, which was found to be negative. Differential also includes cardiogenic hypotension in the context of presenting NSTEMI.  No clinical or radiographic evidence to suggest acute decompensated heart failure at this time in the setting of a history of chronic diastolic heart failure.  The patient is on chronic prednisone therapy in the setting of severe COPD, taking 10 mg p.o. daily, without any recent missed doses, thereby reducing risk for adrenal insufficiency.  Although patient does have a mildly elevated white blood cell count with mild tachycardia, there does not appear to be an overt underlying source of infection at this time.  Therefore, sepsis is felt to be less likely at the present, although will expand evaluation for underlying infection, as further described below.  Of note, presenting chest x-ray showed no evidence of acute cardiopulmonary process, including no evidence of pneumonia.  In the setting of blood pressure response to interval IV fluids as well as presenting NSTEMI with further evaluation and management ongoing, will refrain from further evaluation for acute pulmonary embolism at this time, but may reconsider if refractory hypotension occurs.  Of note, left lower extremity was evaluated, without evidence of cellulitis at  this time.   Plan: Lactated Ringer's at 75 cc/h x 12 hours.  Monitor strict I's and O's and daily weights.  Check urinalysis, lactic acid level, procalcitonin.  Add on liver enzymes.  Follow-up result of nasopharyngeal COVID-19/influenza PCR checked in the emergency department today.  Repeat CBC with differential in the morning.  Monitor on telemetry.  Monitor continuous pulse oximetry.  Further evaluation management of presenting NSTEMI, as above, including heparin drip.  Echocardiogram in the morning.  Continue home chronic prednisone therapy.  Check blood cultures x2.  Per cardiology consult, will hold home Toprol-XL and Lasix at this time.      #) Chronic hypoxic respiratory failure in the setting of gold stage IV COPD: On continuous 2 L nasal cannula at baseline, without evidence of acute exacerbation at this time.  Patient confirms that he is a former smoker, having completely quit smoking nearly 40 years ago after a greater than 60-pack-year history of smoking.  Outpatient respiratory regimen includes Spiriva, Symbicort, as needed Xopenex.  Additionally, the patient is on chronic corticosteroid therapy with prednisone 10 mg p.o. daily, without any recent missed doses.  He also uses trilogy BiPAP QHS.   Plan: Continue home respiratory regimen.  I have placed a respiratory therapy order for use of home trilogy BiPAP nightly.  Monitor on continuous pulse oximetry.  Repeat BMP in the morning.      #) Hyperlipidemia: On atorvastatin 20 mg p.o. daily as an outpatient.  In the setting of presenting NSTEMI, will increase home dose of atorvastatin to 40 mg p.o. daily, with next dose to occur now, as further detailed above.  Plan: Increase home atorvastatin from 20 mg p.o. daily to 40 mg p.o. daily, as above.     #) Chronic diastolic heart failure: Documented history of such, with most recent echocardiogram, which was performed in January 2022 showing results as further detailed above, notably  LVEF 60 to 65% without evidence of focal wall motion abnormalities, and no evidence of significant valvular pathology.  Outpatient diuretic regimen consists of Lasix 40 mg p.o. daily, with patient reporting good compliance on this home regimen.  No overt evidence of acutely decompensated heart failure at this time, including presenting chest x-ray showing no evidence of infiltrate, edema, or effusion, while the patient denies any recent worsening of left lower extremity edema.  Per cardiology consult, will hold home Lasix for now and provide gentle IV fluid resuscitation, as further detailed above.  Plan: Hold home Lasix for now, as above.  Monitor strict I's and O's and daily weights.  Further evaluation management of  presenting NSTEMI, as above.  Repeat BMP and serum magnesium levels in the morning.  Echocardiogram has been ordered for the morning, with indication being further evaluation of presenting chest pain in the setting of NSTEMI, as further detailed above.      #) Paroxysmal atrial fibrillation: Documented history of such. In the setting of a CHA2DS2-VASc score of 5, there is an indication for the patient to be on chronic anticoagulation for thromboembolic prophylaxis.  However, the patient is not chronically anticoagulated due to history of recurrent falls.  Rather, he is on daily baby aspirin.  Home AV nodal blocking regimen consists of Toprol-XL, with today's cardiology consult including recommendations to hold home beta-blocker for now in the setting of soft blood pressures.  Appears to be in sinus rhythm at this time, with presenting EKG as further detailed above.  Plan: monitor strict I's & O's and daily weights. Repeat BMP and CBC in the morning. Check serum magnesium level in the morning.  Holding home beta-blocker for now per instruction via cardiology consult, as further detailed above.  Monitor on telemetry.      #) Chronic hypoosmolar hyponatremia: Since January 2022, patient's  serum sodium levels been associated with a baseline range of 1 28-1 32, although there has been a low of 125 over that timeframe.  Today's labs reflect serum sodium level within this baseline range.  Differential for the patient's chronic hyponatremia, includes potential chronic SIADH in the setting of severe, Gold stage IV COPD as well as potential associated pharmacologic contribution from chronic prednisone therapy.  We will trend ensuing serum sodium values to gain further insight into the patient's current volume status particularly in response to the administration of gentle IV fluids overnight, as further described above.  Plan: Monitor strict I's and O's and daily weights.  Repeat BMP in the morning.      #) Chronic normocytic anemia: Associated baseline hemoglobin of 7.5-10.  Suspect degree of anemia of chronic disease.  Presenting hemoglobin of 9.2 found to be within this range, and associated with normocytic/normochromic findings as well as nonelevated RDW.  No evidence of active bleed at this time.   Plan: Repeat CBC in the morning.      DVT prophylaxis: On heparin drip Code Status: Per my discussions with the patient as well as his wife, patient conveys that in the setting of cardiopulmonary arrest, that he would not want to be intubated, but would want chest compressions, electrical shocks, and antiarrhythmic medications.  I repeated these wishes back to the patient, who confirmed that these are his true wishes at the present time, and via chart review, found that he had expressed similar wishes at the time of his most recent hospitalization. Family Communication: Case was discussed with the patient's wife, who was present at bedside Disposition Plan: Per Rounding Team Consults called: Case was discussed with the on-call cardiologist, Dr. Neena Rhymes, who was formally consulted, with additional details as provided above. Admission status: Inpatient; PCU.     Of note, this  patient was added by me to the following Admit List/Treatment Team: [email or web address].      PLEASE NOTE THAT DRAGON DICTATION SOFTWARE WAS USED IN THE CONSTRUCTION OF THIS NOTE.   [facility] Pager [phone] From [place]  Otherwise, please contact night-coverage  [email or web address] Password [facility]   06/05/2020, 7:23 PM

## 2020-06-05 NOTE — ED Notes (Signed)
Admitting doctor called about a c-pap machine that the pt uses at night  Its coming from home  He will write the order

## 2020-06-05 NOTE — ED Notes (Signed)
Elevated tro reported to dr Ron Parker

## 2020-06-05 NOTE — ED Notes (Signed)
unsuccrssful attempt to call report nurse is tied up

## 2020-06-05 NOTE — ED Notes (Signed)
The pt has been seen by cards and the hospitalist is at bedside now

## 2020-06-05 NOTE — Consult Note (Signed)
Cardiology Consultation:   Patient ID: [name] MRN: [id number]; DOB: 20-Aug-1929  Admit date: 06/05/2020 Date of Consult: 06/05/2020  Primary Care Provider: [name], MD Primary Cardiologist: Sinclair Grooms, MD  Primary Electrophysiologist:  None   Patient Profile:   [name] is a 87 y.o. male with a hx of PAD s/p R AKA, COPD on home O2, AF (not on AC due to falls), CAD s/p CABG, HTN, HLD, chronic anemia, chronic sacral and LLE wounds who presents for chest pain.  History of Present Illness:   [name] reports sudden onset chest pain earlier in the day. He does not normally have chest pain and has not ever had to take nitroglycerin at home. His pain was associated with SOB, though this is a chronic symptom for him. The pain was severe and L sided. He took 2 tablets of SLN and the pain improved and eventually resolved. He was brought to the ED for evaluation.   In the ED, the patient's blood pressure was found to be low with systolic pressures in the 60's to 80's. He states his SBP at home is typically in the 130's to 140's. He had taken all of his medications as prescribed and has been eating and drinking normally. He was tachycardic with Hrs in the 100's in AF, afebrile, and with normal oxygen saturation. His labs were notable for Na 130, Cr 1.3, Troponin 287, BNP 473, WBC 17, Hct 29. An ECG was negative for acute ST/T wave abnormalities. He was given an IV fluid bolus, aspirin, and heparin drip. He was admitted to medicine with cardiology consultation.   The patient denies any recent fevers, chills, abdominal pain, vomiting, diarrhea, dysuria, polyuria.   Heart Pathway Score:     Past Medical History:  Diagnosis Date  . Anxiety   . Arthritis    "generalized" (04/04/2017)  . Asthma   . Basal cell carcinoma    "left ear"  . BPH (benign prostatic hyperplasia)    severe; s/p multiple biopsies  . CAD (coronary artery disease)   . Carotid artery disease ([place])   .  Chronic diastolic CHF (congestive heart failure) ([place]) 2019  . Chronic pain 04/08/2020  . Chronic respiratory failure ([place])   . Chronic rhinitis   . COPD (chronic obstructive pulmonary disease) (HCC)    2L [place] O2  . Depression   . Dilation of biliary tract   . Elevated troponin 03/09/2017  . Fall 04/08/2020  . Gallstones   . GERD (gastroesophageal reflux disease)   . History of blood transfusion    "w/his CABG" (04/04/2017)  . History of kidney stones   . Hyperlipidemia   . Hypertension   . On home oxygen therapy    "~ 24/7" (04/04/2017)  . Peripheral vascular disease ([place])   . Pneumonia 2019  . RBBB   . Squamous cell carcinoma of skin 04/23/2013   in situ-Right flank (txpbx)  . Squamous cell carcinoma of skin 01/23/2017   Bowens/ in siut- Left ear rim (CX3FU)  . Syncope and collapse   . Ureteral tumor 08/2015   had endoscopic procedure for evaluation, unable to reach for biopsy  . Urinary catheter (Foley) change required     Past Surgical History:  Procedure Laterality Date  . ABDOMINAL AORTOGRAM W/LOWER EXTREMITY Bilateral 06/30/2019   Procedure: ABDOMINAL AORTOGRAM W/LOWER EXTREMITY;  Surgeon: Waynetta Sandy, MD;  Location: [facility];  Service: Cardiovascular;  Laterality: Bilateral;  . AMPUTATION Right 08/11/2019  Procedure: RIGHT ABOVE KNEE AMPUTATION;  Surgeon: Angelia Mould, MD;  Location: [facility];  Service: Vascular;  Laterality: Right;  . AMPUTATION Right 01/27/2020   Procedure: RIGHT AMPUTATION ABOVE KNEE REVISION;  Surgeon: Waynetta Sandy, MD;  Location: [facility];  Service: Vascular;  Laterality: Right;  . BASAL CELL CARCINOMA EXCISION Left    ear  . CARDIAC CATHETERIZATION     "prior to bypass"  . CATARACT EXTRACTION W/ INTRAOCULAR LENS  IMPLANT, BILATERAL Bilateral   . CORONARY ARTERY BYPASS GRAFT  10/2001   LIMA to LAD, SVG to OM1-2, SVG to RCA and PDA  . LITHOTRIPSY    . PROSTATE BIOPSY  10/2012   had bleeding after surgery.   . TONSILLECTOMY    . TRANSURETHRAL RESECTION OF BLADDER TUMOR N/A 04/06/2020   Procedure: TRANSURETHRAL RESECTION OF BLADDER TUMOR (TURBT)/ POST OPERATIVE INSTILLATION OF GEMCITABINE;  Surgeon: Festus Aloe, MD;  Location: [facility];  Service: Urology;  Laterality: N/A;     Home Medications:  Prior to Admission medications   Medication Sig Start Date End Date Taking? Authorizing Provider  acetaminophen (TYLENOL) 500 MG tablet Take 500 mg by mouth every 6 (six) hours as needed for moderate pain.   Yes [provider]  ALPRAZolam Duanne Moron) 0.5 MG tablet Take 0.5 mg by mouth 3 (three) times daily as needed for anxiety. 03/05/20  Yes [provider]  aspirin 81 MG tablet Take 81 mg by mouth daily.   Yes [provider]  atorvastatin (LIPITOR) 20 MG tablet Take 20 mg by mouth at bedtime. 03/19/20  Yes [provider]  calcium carbonate (TUMS - DOSED IN MG ELEMENTAL CALCIUM) 500 MG chewable tablet Chew 1 tablet (200 mg of elemental calcium total) by mouth 2 (two) times daily as needed for indigestion or heartburn. 09/01/19  Yes Love, Ivan Anchors, PA-C  Cholecalciferol (VITAMIN D3) 50 MCG (2000 UT) TABS Take 2,000 Units by mouth daily with lunch. 02/16/20  Yes Angiulli, Lavon Paganini, PA-C  COLLAGEN MATRIX-SILVER EX Apply 1 tablet topically every other day.   Yes [provider]  famotidine (PEPCID) 20 MG tablet Take 20 mg by mouth at bedtime as needed for heartburn or indigestion.   Yes [provider]  finasteride (PROSCAR) 5 MG tablet Take 1 tablet (5 mg total) by mouth daily with lunch. 02/16/20  Yes Angiulli, Lavon Paganini, PA-C  FINASTERIDE PO Take 1 tablet by mouth daily.   Yes [provider]  FLUoxetine (PROZAC) 10 MG capsule Take 10 mg by mouth at bedtime. 05/26/20  Yes [provider]  fluticasone (FLONASE) 50 MCG/ACT nasal spray Place 1-2 sprays into both nostrils daily.   Yes [provider]  furosemide (LASIX) 20 MG tablet Take  40-80 mg by mouth See admin instructions. In 20mg  tablets, take 40mg  daily and 80mg  PRN   Yes [provider]  HYDROcodone-acetaminophen (NORCO/VICODIN) 5-325 MG tablet Take 1 tablet by mouth every 4 (four) hours as needed for moderate pain. 02/16/20  Yes Angiulli, Lavon Paganini, PA-C  levalbuterol [facility] HFA) 45 MCG/ACT inhaler Inhale 2 puffs into the lungs every 4 (four) hours as needed for wheezing or shortness of breath. 02/16/20  Yes Angiulli, Lavon Paganini, PA-C  levalbuterol (XOPENEX) 0.63 MG/3ML nebulizer solution Take 3 mLs (0.63 mg total) by nebulization every 4 (four) hours as needed for wheezing or shortness of breath. 10/13/19  Yes Tanda Rockers, MD  Metoprolol Succinate (TOPROL XL PO) Take 1 tablet by mouth daily.   Yes [provider]  mometasone (NASONEX) 50 MCG/ACT nasal spray 1-2 sprays to each nostril twice daily, but pre treat with afrin 2 minutes prior to using x 5 days, then stop afrin for 5 days before reusing. 05/07/20  Yes Tanda Rockers, MD  Multiple Vitamins-Minerals (PRESERVISION AREDS 2) CAPS Take 1 capsule by mouth daily.   Yes [provider]  NITROSTAT 0.4 MG SL tablet Place 0.4 mg under the tongue every 5 (five) minutes as needed for chest pain.   Yes [provider]  Polyethyl Glycol-Propyl Glycol (SYSTANE) 0.4-0.3 % SOLN Apply 1 drop to eye 2 (two) times daily as needed (eye irritation). Patient taking differently: Apply 1 drop to eye in the morning, at noon, and at bedtime. 09/01/19  Yes Love, Ivan Anchors, PA-C  polyethylene glycol (MIRALAX / GLYCOLAX) 17 g packet Take 17 g by mouth daily. Patient taking differently: Take 17 g by mouth daily as needed for moderate constipation. 09/01/19  Yes Love, Ivan Anchors, PA-C  predniSONE (DELTASONE) 10 MG tablet Take 1 tablet (10 mg total) by mouth daily. 05/06/20  Yes Tanda Rockers, MD  sodium chloride (MURO 128) 5 % ophthalmic solution Place 1 drop into both eyes 4 (four) times daily. 08/22/19  Yes Love,  Ivan Anchors, PA-C  SPIRIVA RESPIMAT 2.5 MCG/ACT AERS INHALE 2 PUFFS BY MOUTH EVERY DAY Patient taking differently: Take 2 puffs by mouth daily. 05/17/20  Yes Tanda Rockers, MD  SYMBICORT 160-4.5 MCG/ACT inhaler Inhale 2 puffs into the lungs 2 (two) times daily. 02/16/20  Yes Angiulli, Lavon Paganini, PA-C  vitamin B-12 (CYANOCOBALAMIN) 1000 MCG tablet Take 1 tablet (1,000 mcg total) by mouth daily. 02/16/20  Yes Angiulli, Lavon Paganini, PA-C  Vitamins A & D (SWEEN EX) Apply 1 application topically daily.   Yes [provider]  alfuzosin (UROXATRAL) 10 MG 24 hr tablet Take 1 tablet (10 mg total) by mouth daily with breakfast. Patient not taking: Reported on 06/05/2020 04/06/20   Festus Aloe, MD  furosemide (LASIX) 40 MG tablet Take 1 tablet (40 mg total) by mouth daily. May take up to 80 mg as needed. Patient not taking: Reported on 06/05/2020 03/01/20   Burtis Junes, NP  guaiFENesin (MUCINEX) 600 MG 12 hr tablet Take 1 tablet (600 mg total) by mouth 2 (two) times daily. Patient not taking: No sig reported 02/16/20   Angiulli, Lavon Paganini, PA-C  OXYGEN Inhale 2 L/min into the lungs continuous.     [provider]  pantoprazole (PROTONIX) 40 MG tablet TAKE 1 TABLET(40 MG) BY MOUTH DAILY 30 TO 60 MINUTES BEFORE FIRST MEAL OF THE DAY Patient not taking: No sig reported 02/16/20   Cathlyn Parsons, PA-C    Inpatient Medications: Scheduled Meds: . aspirin  324 mg Oral Once  . heparin  3,000 Units Intravenous Once   Continuous Infusions: . heparin     PRN Meds:   Allergies:    Allergies  Allergen Reactions  . Cefdinir Diarrhea and Other (See Comments)    Severe Diarrhea  . Nitrofurantoin Swelling and Other (See Comments)    Hand Swelling  . Sulfa Antibiotics Anaphylaxis and Swelling  . Sulfonamide Derivatives Swelling and Other (See Comments)    Facial/tongue swelling  . Tape Other (See Comments)    SKIN IS VERY THIN AND TEARS EASILY!!!!! Please do NOT use "plastic" tape- USE PAPER!!   . Ciprofloxacin Itching, Rash and Other (See Comments)    Red itchy hands  . Nitrofurantoin Swelling and Other (  See Comments)    Swollen hands  . Amoxicillin Er Other (See Comments)    Frequest urination. Pt reports "not really" allergic 01/24/20  . Doxycycline Other (See Comments)    "Felt terrible" Pt wife reports "not really" allergic 01/24/20  . Keflex [Cephalexin] Other (See Comments)    Pt does not recall reaction   . Mirtazapine Other (See Comments)    Pt does not recall reaction  . Ranolazine Other (See Comments)    Inability to ambulate   . Chlorhexidine Gluconate Rash  . Levaquin [Levofloxacin] Itching and Rash  . Sertraline Anxiety and Other (See Comments)    Makes the patient jittery    Social History:   Social History   Socioeconomic History  . Marital status: Married    Spouse name: Cabell Lazenby  . Number of children: 2  . Years of education: 69  . Highest education level: 12th grade  Occupational History  . 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 Smoking status: Former Smoker    Packs/day: 2.00    Years: 33.00    Pack years: 66.00    Types: Cigarettes    Quit date: 03/25/1982    Years since quitting: 38.2  . Smokeless tobacco: Never Used  Vaping Use  . Vaping Use: Never used  Substance and Sexual Activity  . Alcohol use: Yes    Alcohol/week: 0.0 standard drinks    Comment: 1.5-3 oz daily in the past, minimal use in the last few months  . Drug use: No  . Sexual activity: Not Currently  Other Topics Concern  . Not on file  Social History Narrative   ** Merged History Encounter **       Pt lives at home with his spouse.         Social Determinants of Health   Financial Resource Strain: Low Risk   . Difficulty of Paying Living Expenses: Not hard at all  Food Insecurity: No Food Insecurity  . Worried About Charity fundraiser in the Last Year: Never true  . Ran Out of Food in the Last Year: Never  true  Transportation Needs: No Transportation Needs  . Lack of Transportation (Medical): No  . Lack of Transportation (Non-Medical): No  Physical Activity: Inactive  . Days of Exercise per Week: 0 days  . Minutes of Exercise per Session: 0 min  Stress: No Stress Concern Present  . Feeling of Stress : Only a little  Social Connections: Moderately Integrated  . Frequency of Communication with Friends and Family: More than three times a week  . Frequency of Social Gatherings with Friends and Family: More than three times a week  . Attends Religious Services: More than 4 times per year  . Active Member of Clubs or Organizations: No  . Attends Archivist Meetings: Never  . Marital Status: Married  Human resources officer Violence: Not At Risk  . Fear of Current or Ex-Partner: No  . Emotionally Abused: No  . Physically Abused: No  . Sexually Abused: No    Family History:    Family History  Problem Relation Age of Onset  . Heart disease Mother   . Heart disease Father        Before age 26  . Breast cancer Mother   . Cancer Mother        Breast cancer  . Hypertension Mother   . Other Mother        AAA  and   Amputation  . Heart attack Father   . Stroke Brother        x3, still living   . Peripheral vascular disease Brother   . Heart disease Brother   . Hyperlipidemia Brother   . Hypertension Brother   . Heart disease Brother   . Allergies Brother      ROS:  Please see the history of present illness.   All other ROS reviewed and negative.     Physical Exam/Data:   Vitals:   06/05/20 1630 06/05/20 1700 06/05/20 1730 06/05/20 1800  BP: 111/65 113/66 (!) 98/56 (!) 88/51  Pulse: 96 93 (!) 126 (!) 108  Resp: 14 11 17 20   Temp:      SpO2: 98% 100% 100% 100%   No intake or output data in the 24 hours ending 06/05/20 1911 Last 3 Weights 04/08/2020 04/08/2020 04/06/2020  Weight (lbs) 121 lb 14.6 oz 118 lb 9.7 oz 118 lb 8 oz  Weight (kg) 55.3 kg 53.8 kg 53.751 kg      There is no height or weight on file to calculate BMI.  General:  Frail, cachectic Neck: JVP elevated to >10cm H2O Endocrine:  No thryomegaly Cardiac:  Irregularly irregular, 2/6 systolic murmur across the precordium Lungs:  Distant breath sounds, scant wheezing, no appreciable crackles Abd: soft, nontender, no hepatomegaly  Ext: 2+ pitting edema in L foot, s/p R AKA Skin: warm and dry, diffuse ecchymoses Psych:  Normal affect   EKG:  The EKG was personally reviewed and demonstrates:  NSR w PACs, RBBB  Relevant CV Studies: Echo 01/28/20 IMPRESSIONS   1. Left ventricular ejection fraction, by estimation, is 60 to 65%. The  left ventricle has normal function. The left ventricle has no regional  wall motion abnormalities. There is mild concentric left ventricular  hypertrophy. Left ventricular diastolic  parameters are indeterminate.  2. Right ventricular systolic function is normal. The right ventricular  size is normal.  3. The mitral valve is normal in structure. No evidence of mitral valve  regurgitation. No evidence of mitral stenosis.  4. The aortic valve is tricuspid. There is mild calcification of the  aortic valve. There is mild thickening of the aortic valve. Aortic valve  regurgitation is not visualized. Mild aortic valve sclerosis is present,  with no evidence of aortic valve  stenosis.  5. The inferior vena cava is normal in size with greater than 50%  respiratory variability, suggesting right atrial pressure of 3 mmHg.   Laboratory Data:  High Sensitivity Troponin:   Recent Labs  Lab 06/05/20 1600  TROPONINIHS 287*     Chemistry Recent Labs  Lab 06/05/20 1600  NA 130*  K 3.6  CL 87*  CO2 31  GLUCOSE 138*  BUN 21  CREATININE 1.32*  CALCIUM 9.2  GFRNONAA 52*  ANIONGAP 12    No results for input(s): PROT, ALBUMIN, AST, ALT, ALKPHOS, BILITOT in the last 168 hours. Hematology Recent Labs  Lab 06/05/20 1600  WBC 17.1*  RBC 3.01*  HGB 9.2*   HCT 29.4*  MCV 97.7  MCH 30.6  MCHC 31.3  RDW 15.5  PLT 272   BNP Recent Labs  Lab 06/05/20 1600  BNP 473.5*    DDimer No results for input(s): DDIMER in the last 168 hours.   Radiology/Studies:  DG Chest 2 View  Result Date: 06/05/2020 CLINICAL DATA:  Chest pain. EXAM: CHEST - 2 VIEW COMPARISON:  02/08/2020 and older studies. FINDINGS: Stable changes from prior  CABG surgery. Cardiac silhouette is normal in size and configuration. No mediastinal or hilar masses. No adenopathy. Lungs are hyperexpanded. Mild interstitial prominence most evident at the bases. Lungs otherwise clear. No pleural effusion or pneumothorax. Skeletal structures are demineralized, but intact. IMPRESSION: 1. No acute cardiopulmonary disease. 2. COPD. Electronically Signed   By: Lajean Manes M.D.   On: 06/05/2020 17:29      TIMI Risk Score for Unstable Angina or Non-ST Elevation MI:   The patient's TIMI risk score is 5, which indicates a 26% risk of all cause mortality, new or recurrent myocardial infarction or need for urgent revascularization in the next 14 days.   Assessment and Plan:  [name] is a 87 y.o. male with a hx of PAD s/p R AKA, COPD on home O2, AF (not on AC due to falls), CAD s/p CABG, HTN, HLD, chronic anemia, chronic sacral and LLE wounds who presents for chest pain, found to have elevated troponin.   The patient's current hemodynamic state is very concerning. Although his story of chest pain in the setting of his vascular disease history is suggestive of type 1 ACS event, it seems at least possible that there is something else going on as well driving this hypotension. His WBC count is elevated and he has many possible portals for entry for bacteria, thus sepsis is high on the differential. His last echocardiogram from January showed a normal LVEF so unless something major has changed with his contractility in the past 6 months (which is of course possible) then cardiogenic shock seems less  likely. Furthermore it would be unusual (though not impossible) for this patient to present with cardiogenic shock from ACS and only a modest troponin elevation without ST elevations on ECG. It would be reasonable to proceed with treatment for presumed type 1 ACS for now while working up and treating other possible contributing diagnoses.   - recommend pan cultures - check lactate - workup other possible etiologies of shock  - low threshold for empiric antibiotics - repeat troponin - check echo in AM - ASA 324mg  then 81mg  daily - heparin drip for ACS per pharmacy protocol - would hold metoprolol for now - agree with gentle fluid resuscitation, though patient does not appear particularly volume deplete - hold home lasix - would be good idea to have code status discussion with patient and wife at this time - cont atorvastatin 20mg  QHS; would be good to increase this to at least 40mg  QHS sometime during this hospitalization     For questions or updates, please contact [place] Please consult [email or web address] for contact info under   Signed, Marcie Mowers, MD  06/05/2020 7:11 PM

## 2020-06-05 NOTE — ED Notes (Signed)
Patient transported to X-ray 

## 2020-06-05 NOTE — Progress Notes (Signed)
ANTICOAGULATION CONSULT NOTE - Initial Consult  Pharmacy Consult for heparin Indication: chest pain/ACS  Allergies  Allergen Reactions  . Cefdinir Diarrhea and Other (See Comments)    Severe Diarrhea  . Nitrofurantoin Swelling and Other (See Comments)    Hand Swelling  . Sulfa Antibiotics Anaphylaxis and Swelling  . Sulfonamide Derivatives Swelling and Other (See Comments)    Facial/tongue swelling  . Tape Other (See Comments)    SKIN IS VERY THIN AND TEARS EASILY!!!!! Please do NOT use "plastic" tape- USE PAPER!!  . Ciprofloxacin Itching, Rash and Other (See Comments)    Red itchy hands  . Nitrofurantoin Swelling and Other (See Comments)    Swollen hands  . Amoxicillin Er Other (See Comments)    Frequest urination. Pt reports "not really" allergic 01/24/20  . Doxycycline Other (See Comments)    "Felt terrible" Pt wife reports "not really" allergic 01/24/20  . Keflex [Cephalexin] Other (See Comments)    Pt does not recall reaction   . Mirtazapine Other (See Comments)    Pt does not recall reaction  . Ranolazine Other (See Comments)    Inability to ambulate   . Chlorhexidine Gluconate Rash  . Levaquin [Levofloxacin] Itching and Rash  . Sertraline Anxiety and Other (See Comments)    Makes the patient jittery    Patient Measurements:   Heparin Dosing Weight: 55 kg   Vital Signs: Temp: 99.2 F (37.3 C) (05/28 1606) BP: 88/51 (05/28 1800) Pulse Rate: 108 (05/28 1800)  Labs: Recent Labs    06/05/20 1600  HGB 9.2*  HCT 29.4*  PLT 272  CREATININE 1.32*  TROPONINIHS 287*    CrCl cannot be calculated (Unknown ideal weight.).   Medical History: Past Medical History:  Diagnosis Date  . Anxiety   . Arthritis    "generalized" (04/04/2017)  . Asthma   . Basal cell carcinoma    "left ear"  . BPH (benign prostatic hyperplasia)    severe; s/p multiple biopsies  . CAD (coronary artery disease)   . Carotid artery disease ([place])   . Chronic diastolic CHF (congestive  heart failure) ([place]) 2019  . Chronic pain 04/08/2020  . Chronic respiratory failure ([place])   . Chronic rhinitis   . COPD (chronic obstructive pulmonary disease) (HCC)    2L [place] O2  . Depression   . Dilation of biliary tract   . Elevated troponin 03/09/2017  . Fall 04/08/2020  . Gallstones   . GERD (gastroesophageal reflux disease)   . History of blood transfusion    "w/his CABG" (04/04/2017)  . History of kidney stones   . Hyperlipidemia   . Hypertension   . On home oxygen therapy    "~ 24/7" (04/04/2017)  . Peripheral vascular disease ([place])   . Pneumonia 2019  . RBBB   . Squamous cell carcinoma of skin 04/23/2013   in situ-Right flank (txpbx)  . Squamous cell carcinoma of skin 01/23/2017   Bowens/ in siut- Left ear rim (CX3FU)  . Syncope and collapse   . Ureteral tumor 08/2015   had endoscopic procedure for evaluation, unable to reach for biopsy  . Urinary catheter (Foley) change required     Medications:  (Not in a hospital admission)   Assessment: 82 YOM with severe vascular disease here with chest pain and elevated cardiac markers concerning for ACS. Pharmacy consulted to start IV heparin.   H/H low, Plt wnl, SCr 1.32  Goal of Therapy:  Heparin level 0.3-0.7 units/ml Monitor platelets by anticoagulation protocol:  Yes   Plan:  -Heparin 3000 units IV bolus followed by heparin infusion at 650 units/hr -F/u 8 hr HL -Monitor daily HL, CBC and s/s of bleeding   Albertina Parr, PharmD., BCPS, BCCCP Clinical Pharmacist Please refer to [facility] for unit-specific pharmacist

## 2020-06-05 NOTE — ED Notes (Signed)
Report given to the rn on 6e

## 2020-06-05 NOTE — ED Notes (Signed)
The pts wife  Is at  The bedside and she is concerned for the pts wounds bandages removed from the lt lower leg and there is one on the buttocks the wife wants  evaluated

## 2020-06-05 NOTE — ED Triage Notes (Signed)
Pt reports fast HR and chest pain since 10pm last night.  Took 1/2 of Toprol that helped for "a little while".  Also reports SOB.  Wears 2 liters O2 at home.

## 2020-06-05 NOTE — ED Provider Notes (Signed)
[facility] EMERGENCY DEPARTMENT Provider Note   CSN: [id number] Arrival date & time: 06/05/20  1548     History Chief Complaint  Patient presents with  . Chest Pain    [name] is a 87 y.o. male.   Chest Pain Pain location:  L chest and R chest Pain radiates to:  Does not radiate Pain severity:  Moderate Onset quality:  Gradual Timing:  Intermittent Progression:  Waxing and waning Chronicity:  New Context: at rest   Relieved by:  Nitroglycerin Worsened by:  Nothing Ineffective treatments:  None tried Associated symptoms: no back pain, no cough, no fever, no headache, no nausea, no palpitations, no shortness of breath and no vomiting        Past Medical History:  Diagnosis Date  . Anxiety   . Arthritis    "generalized" (04/04/2017)  . Asthma   . Atrial fibrillation ([place])   . Basal cell carcinoma    "left ear"  . BPH (benign prostatic hyperplasia)    severe; s/p multiple biopsies  . CAD (coronary artery disease)   . Carotid artery disease ([place])   . Chronic diastolic CHF (congestive heart failure) ([place]) 2019  . Chronic pain 04/08/2020  . Chronic respiratory failure ([place])   . Chronic rhinitis   . COPD (chronic obstructive pulmonary disease) (HCC)    2L [place] O2  . Depression   . Dilation of biliary tract   . Elevated troponin 03/09/2017  . Fall 04/08/2020  . Gallstones   . GERD (gastroesophageal reflux disease)   . History of blood transfusion    "w/his CABG" (04/04/2017)  . History of kidney stones   . Hyperlipidemia   . Hypertension   . On home oxygen therapy    "~ 24/7" (04/04/2017)  . Peripheral vascular disease ([place])   . Pneumonia 2019  . RBBB   . Squamous cell carcinoma of skin 04/23/2013   in situ-Right flank (txpbx)  . Squamous cell carcinoma of skin 01/23/2017   [place]/ in siut- Left ear rim (CX3FU)  . Syncope and collapse   . Ureteral tumor 08/2015   had endoscopic procedure for evaluation, unable to reach for biopsy  .  Urinary catheter (Foley) change required     Patient Active Problem List   Diagnosis Date Noted  . Hypotension 06/05/2020  . Atrial fibrillation ([place])   . Atrial fibrillation with RVR ([place]) 04/08/2020  . Fall at home, initial encounter 04/08/2020  . Chronic pain 04/08/2020  . [place] 02/27/2020  . Chronic peripheral venous hypertension with lower extremity complication 37/90/2409  . Complete traumatic amputation at level between right hip and knee, initial encounter ([place]) 02/27/2020  . Difficulty in walking, not elsewhere classified 02/27/2020  . Dyspepsia 02/27/2020  . Foot ulcer ([place]) 02/27/2020  . Hyperglycemia 02/27/2020  . Iron deficiency anemia 02/27/2020  . Major depression single episode, in partial remission ([place]) 02/27/2020  . Malnutrition related to chronic disease ([place]) 02/27/2020  . Osteomyelitis ([place]) 02/27/2020  . Paresthesia 02/27/2020  . Right upper quadrant pain 02/27/2020  . Slow transit constipation 02/27/2020  . Spasm of bladder 02/27/2020  . Urinary tract obstruction 02/27/2020  . Vitamin D deficiency 02/27/2020  . Congestive heart failure ([place]) 02/27/2020  . Prediabetes   . Urinary retention   . Normocytic anemia   . Chronic diastolic congestive heart failure ([place])   . Major depressive disorder with current active episode   . Paroxysmal atrial fibrillation (HCC)   . Right above-knee amputee ([place])  02/05/2020  . Typical atrial flutter ([place])   . New onset atrial fibrillation ([place])   . Malnutrition of moderate degree 01/30/2020  . Cellulitis of left foot 01/24/2020  . Cellulitis and abscess of left lower extremity 01/24/2020  . Abnormality of gait 10/20/2019  . Pain of left heel 09/01/2019  . Labile blood pressure   . Anxiety state   . Hypothyroidism   . Right hip pain   . Sacral decubitus ulcer   . Post-operative pain   . Chronic systolic congestive heart failure ([place])   . Urinary retention due to benign prostatic hyperplasia 08/20/2019  . SOB  (shortness of breath)   . Chronic systolic CHF (congestive heart failure) ([place])   . Hypoalbuminemia due to protein-calorie malnutrition ([place])   . Transaminitis   . Acute blood loss anemia   . Hyponatremia   . Postoperative pain   . Supplemental oxygen dependent   . S/P AKA (above knee amputation) ([place]) 08/14/2019  . Peripheral arterial disease ([place]) 08/11/2019  . Abnormal urinalysis 08/11/2019  . Critical lower limb ischemia ([place]) 08/11/2019  . DNR (do not resuscitate)   . Voiding difficulty   . Palliative care by specialist   . Pressure injury of skin 06/29/2019  . Cellulitis of left lower extremity 06/28/2019  . Cellulitis of right lower extremity 06/27/2019  . AKI (acute kidney injury) ([place]) 06/27/2019  . Headache 10/16/2018  . Severe major depression without psychotic features ([place]) 09/22/2017  . Generalized anxiety disorder 09/22/2017  . DNR (do not resuscitate) discussion 05/10/2017  . Weakness generalized 04/04/2017  . Chest pain 03/24/2017  . AV block, [place] 1 03/24/2017  . CAD (coronary artery disease) 03/24/2017  . Chronic diastolic CHF (congestive heart failure) ([place]) 03/24/2017  . COPD (chronic obstructive pulmonary disease) ([place]) 03/24/2017  . NSTEMI (non-ST elevated myocardial infarction) ([place])   . Elevated troponin 03/09/2017  . Acute on chronic diastolic heart failure ([place])   . Tachycardia 03/08/2017  . Influenza A 02/04/2017  . Cor pulmonale, chronic (HCC)/ clinical dx  09/13/2016  . Orthostatic hypotension 09/26/2015  . Angina at rest [facility]) 09/24/2015  . BPH (benign prostatic hyperplasia) 09/13/2015  . GERD (gastroesophageal reflux disease) 09/13/2015  . Anxiety disorder 09/13/2015  . Sinusitis, chronic 09/09/2015  . DOE (dyspnea on exertion) 04/15/2015  . Acute heart failure ([place]) 03/18/2015  . Right calf pain 06/18/2014  . Coronary artery disease involving coronary bypass graft of native heart with angina pectoris ([place])   . Peripheral vascular disease  ([place])   . Essential hypertension   . Obstructive sleep apnea   . Rhinitis, chronic 01/29/2007  . COPD   GOLD IV/ 02/steroid dep  01/29/2007  . Chronic respiratory failure with hypoxia and hypercapnia ([place]) 01/29/2007  . Hyperlipidemia     Past Surgical History:  Procedure Laterality Date  . ABDOMINAL AORTOGRAM W/LOWER EXTREMITY Bilateral 06/30/2019   Procedure: ABDOMINAL AORTOGRAM W/LOWER EXTREMITY;  Surgeon: Waynetta Sandy, MD;  Location: [facility];  Service: Cardiovascular;  Laterality: Bilateral;  . AMPUTATION Right 08/11/2019   Procedure: RIGHT ABOVE KNEE AMPUTATION;  Surgeon: Angelia Mould, MD;  Location: [facility];  Service: Vascular;  Laterality: Right;  . AMPUTATION Right 01/27/2020   Procedure: RIGHT AMPUTATION ABOVE KNEE REVISION;  Surgeon: Waynetta Sandy, MD;  Location: [facility];  Service: Vascular;  Laterality: Right;  . BASAL CELL CARCINOMA EXCISION Left    ear  . CARDIAC CATHETERIZATION     "prior to bypass"  . CATARACT EXTRACTION W/  INTRAOCULAR LENS  IMPLANT, BILATERAL Bilateral   . CORONARY ARTERY BYPASS GRAFT  10/2001   LIMA to LAD, SVG to OM1-2, SVG to RCA and PDA  . LITHOTRIPSY    . PROSTATE BIOPSY  10/2012   had bleeding after surgery.  . TONSILLECTOMY    . TRANSURETHRAL RESECTION OF BLADDER TUMOR N/A 04/06/2020   Procedure: TRANSURETHRAL RESECTION OF BLADDER TUMOR (TURBT)/ POST OPERATIVE INSTILLATION OF GEMCITABINE;  Surgeon: Festus Aloe, MD;  Location: [facility];  Service: Urology;  Laterality: N/A;       Family History  Problem Relation Age of Onset  . Heart disease Mother   . Heart disease Father        Before age 42  . Breast cancer Mother   . Cancer Mother        Breast cancer  . Hypertension Mother   . Other Mother        AAA  and   Amputation  . Heart attack Father   . Stroke Brother        x3, still living   . Peripheral vascular disease Brother   . Heart disease Brother   . Hyperlipidemia Brother   .  Hypertension Brother   . Heart disease Brother   . Allergies Brother     Social History   Tobacco Use  . Smoking status: Former Smoker    Packs/day: 2.00    Years: 33.00    Pack years: 66.00    Types: Cigarettes    Quit date: 03/25/1982    Years since quitting: 38.2  . Smokeless tobacco: Never Used  Vaping Use  . Vaping Use: Never used  Substance Use Topics  . Alcohol use: Yes    Alcohol/week: 0.0 standard drinks    Comment: 1.5-3 oz daily in the past, minimal use in the last few months  . Drug use: No    Home Medications Prior to Admission medications   Medication Sig Start Date End Date Taking? Authorizing Provider  acetaminophen (TYLENOL) 500 MG tablet Take 500 mg by mouth every 6 (six) hours as needed for moderate pain.   Yes [provider]  ALPRAZolam Duanne Moron) 0.5 MG tablet Take 0.5 mg by mouth 3 (three) times daily as needed for anxiety. 03/05/20  Yes [provider]  aspirin 81 MG tablet Take 81 mg by mouth daily.   Yes [provider]  atorvastatin (LIPITOR) 20 MG tablet Take 20 mg by mouth at bedtime. 03/19/20  Yes [provider]  calcium carbonate (TUMS - DOSED IN MG ELEMENTAL CALCIUM) 500 MG chewable tablet Chew 1 tablet (200 mg of elemental calcium total) by mouth 2 (two) times daily as needed for indigestion or heartburn. 09/01/19  Yes Love, Ivan Anchors, PA-C  Cholecalciferol (VITAMIN D3) 50 MCG (2000 UT) TABS Take 2,000 Units by mouth daily with lunch. 02/16/20  Yes Angiulli, Lavon Paganini, PA-C  COLLAGEN MATRIX-SILVER EX Apply 1 tablet topically every other day.   Yes [provider]  famotidine (PEPCID) 20 MG tablet Take 20 mg by mouth at bedtime as needed for heartburn or indigestion.   Yes [provider]  finasteride (PROSCAR) 5 MG tablet Take 1 tablet (5 mg total) by mouth daily with lunch. 02/16/20  Yes Angiulli, Lavon Paganini, PA-C  FINASTERIDE PO Take 1 tablet by mouth daily.   Yes [provider]  FLUoxetine  (PROZAC) 10 MG capsule Take 10 mg by mouth at bedtime. 05/26/20  Yes [provider]  fluticasone (FLONASE) 50  MCG/ACT nasal spray Place 1-2 sprays into both nostrils daily.   Yes [provider]  furosemide (LASIX) 20 MG tablet Take 40-80 mg by mouth See admin instructions. In 20mg  tablets, take 40mg  daily and 80mg  PRN   Yes [provider]  HYDROcodone-acetaminophen (NORCO/VICODIN) 5-325 MG tablet Take 1 tablet by mouth every 4 (four) hours as needed for moderate pain. 02/16/20  Yes Angiulli, Lavon Paganini, PA-C  levalbuterol [facility] HFA) 45 MCG/ACT inhaler Inhale 2 puffs into the lungs every 4 (four) hours as needed for wheezing or shortness of breath. 02/16/20  Yes Angiulli, Lavon Paganini, PA-C  levalbuterol (XOPENEX) 0.63 MG/3ML nebulizer solution Take 3 mLs (0.63 mg total) by nebulization every 4 (four) hours as needed for wheezing or shortness of breath. 10/13/19  Yes Tanda Rockers, MD  Metoprolol Succinate (TOPROL XL PO) Take 1 tablet by mouth daily.   Yes [provider]  mometasone (NASONEX) 50 MCG/ACT nasal spray 1-2 sprays to each nostril twice daily, but pre treat with afrin 2 minutes prior to using x 5 days, then stop afrin for 5 days before reusing. 05/07/20  Yes Tanda Rockers, MD  Multiple Vitamins-Minerals (PRESERVISION AREDS 2) CAPS Take 1 capsule by mouth daily.   Yes [provider]  NITROSTAT 0.4 MG SL tablet Place 0.4 mg under the tongue every 5 (five) minutes as needed for chest pain.   Yes [provider]  Polyethyl Glycol-Propyl Glycol (SYSTANE) 0.4-0.3 % SOLN Apply 1 drop to eye 2 (two) times daily as needed (eye irritation). Patient taking differently: Apply 1 drop to eye in the morning, at noon, and at bedtime. 09/01/19  Yes Love, Ivan Anchors, PA-C  polyethylene glycol (MIRALAX / GLYCOLAX) 17 g packet Take 17 g by mouth daily. Patient taking differently: Take 17 g by mouth daily as needed for moderate constipation. 09/01/19  Yes Love,  Ivan Anchors, PA-C  predniSONE (DELTASONE) 10 MG tablet Take 1 tablet (10 mg total) by mouth daily. 05/06/20  Yes Tanda Rockers, MD  sodium chloride (MURO 128) 5 % ophthalmic solution Place 1 drop into both eyes 4 (four) times daily. 08/22/19  Yes Love, Ivan Anchors, PA-C  SPIRIVA RESPIMAT 2.5 MCG/ACT AERS INHALE 2 PUFFS BY MOUTH EVERY DAY Patient taking differently: Take 2 puffs by mouth daily. 05/17/20  Yes Tanda Rockers, MD  SYMBICORT 160-4.5 MCG/ACT inhaler Inhale 2 puffs into the lungs 2 (two) times daily. 02/16/20  Yes Angiulli, Lavon Paganini, PA-C  vitamin B-12 (CYANOCOBALAMIN) 1000 MCG tablet Take 1 tablet (1,000 mcg total) by mouth daily. 02/16/20  Yes Angiulli, Lavon Paganini, PA-C  Vitamins A & D (SWEEN EX) Apply 1 application topically daily.   Yes [provider]  alfuzosin (UROXATRAL) 10 MG 24 hr tablet Take 1 tablet (10 mg total) by mouth daily with breakfast. Patient not taking: Reported on 06/05/2020 04/06/20   Festus Aloe, MD  furosemide (LASIX) 40 MG tablet Take 1 tablet (40 mg total) by mouth daily. May take up to 80 mg as needed. Patient not taking: Reported on 06/05/2020 03/01/20   Burtis Junes, NP  guaiFENesin (MUCINEX) 600 MG 12 hr tablet Take 1 tablet (600 mg total) by mouth 2 (two) times daily. Patient not taking: No sig reported 02/16/20   Angiulli, Lavon Paganini, PA-C  OXYGEN Inhale 2 L/min into the lungs continuous.     [provider]  pantoprazole (PROTONIX) 40 MG tablet TAKE 1 TABLET(40 MG) BY MOUTH DAILY 30 TO 60 MINUTES BEFORE FIRST  MEAL OF THE DAY Patient not taking: No sig reported 02/16/20   Angiulli, Lavon Paganini, PA-C    Allergies    Cefdinir, Nitrofurantoin, Sulfa antibiotics, Sulfonamide derivatives, Tape, Ciprofloxacin, Nitrofurantoin, Amoxicillin er, Doxycycline, Keflex [cephalexin], Mirtazapine, Ranolazine, Chlorhexidine gluconate, Levaquin [levofloxacin], and Sertraline  Review of Systems   Review of Systems  Constitutional: Negative for chills and fever.   HENT: Negative for congestion and rhinorrhea.   Respiratory: Negative for cough and shortness of breath.   Cardiovascular: Positive for chest pain and leg swelling. Negative for palpitations.  Gastrointestinal: Negative for diarrhea, nausea and vomiting.  Genitourinary: Negative for difficulty urinating and dysuria.  Musculoskeletal: Negative for arthralgias and back pain.  Skin: Positive for wound (chronic wounds). Negative for color change and rash.  Neurological: Negative for light-headedness and headaches.    Physical Exam Updated Vital Signs BP 109/67   Pulse 78   Temp 98.5 F (36.9 C)   Resp 18   SpO2 94%   Physical Exam Vitals and nursing note reviewed.  Constitutional:      General: He is not in acute distress.    Appearance: Normal appearance.  HENT:     Head: Normocephalic and atraumatic.     Nose: No rhinorrhea.  Eyes:     General:        Right eye: No discharge.        Left eye: No discharge.     Conjunctiva/sclera: Conjunctivae normal.  Cardiovascular:     Rate and Rhythm: Normal rate and regular rhythm.  Pulmonary:     Effort: Pulmonary effort is normal.     Breath sounds: No stridor. Decreased breath sounds (thoughout) present.  Abdominal:     General: Abdomen is flat. There is no distension.     Palpations: Abdomen is soft.  Musculoskeletal:        General: No deformity or signs of injury.     Left lower leg: Edema present.  Skin:    General: Skin is warm and dry.  Neurological:     General: No focal deficit present.     Mental Status: He is alert. Mental status is at baseline.     Motor: No weakness.  Psychiatric:        Mood and Affect: Mood normal.        Behavior: Behavior normal.        Thought Content: Thought content normal.     ED Results / Procedures / Treatments   Labs (all labs ordered are listed, but only abnormal results are displayed) Labs Reviewed  BASIC METABOLIC PANEL - Abnormal; Notable for the following components:       Result Value   Sodium 130 (*)    Chloride 87 (*)    Glucose, Bld 138 (*)    Creatinine, Ser 1.32 (*)    GFR, Estimated 52 (*)    All other components within normal limits  CBC - Abnormal; Notable for the following components:   WBC 17.1 (*)    RBC 3.01 (*)    Hemoglobin 9.2 (*)    HCT 29.4 (*)    All other components within normal limits  BRAIN NATRIURETIC PEPTIDE - Abnormal; Notable for the following components:   B Natriuretic Peptide 473.5 (*)    All other components within normal limits  HEPATIC FUNCTION PANEL - Abnormal; Notable for the following components:   Total Protein 5.4 (*)    Albumin 3.3 (*)    All other components within normal limits  URINALYSIS,  COMPLETE (UACMP) WITH MICROSCOPIC - Abnormal; Notable for the following components:   APPearance HAZY (*)    Leukocytes,Ua LARGE (*)    WBC, UA >50 (*)    Bacteria, UA RARE (*)    All other components within normal limits  TROPONIN I (HIGH SENSITIVITY) - Abnormal; Notable for the following components:   Troponin I (High Sensitivity) 287 (*)    All other components within normal limits  TROPONIN I (HIGH SENSITIVITY) - Abnormal; Notable for the following components:   Troponin I (High Sensitivity) 426 (*)    All other components within normal limits  SARS CORONAVIRUS 2 (TAT 6-24 HRS)  CULTURE, BLOOD (ROUTINE X 2)  CULTURE, BLOOD (ROUTINE X 2)  MAGNESIUM  LACTIC ACID, PLASMA  PROCALCITONIN  OSMOLALITY, URINE  HEPARIN LEVEL (UNFRACTIONATED)  CBC  COMPREHENSIVE METABOLIC PANEL  MAGNESIUM  CBC WITH DIFFERENTIAL/PLATELET  SODIUM, URINE, RANDOM  CREATININE, URINE, RANDOM  TROPONIN I (HIGH SENSITIVITY)    EKG EKG Interpretation  Date/Time:  Saturday Jun 05 2020 15:58:52 EDT Ventricular Rate:  95 PR Interval:  168 QRS Duration: 128 QT Interval:  360 QTC Calculation: 452 R Axis:   118 Text Interpretation: Sinus rhythm with Premature supraventricular complexes Right bundle branch block Septal infarct , age  undetermined Abnormal ECG Confirmed by Dewaine Conger [phone]) on 06/05/2020 6:27:37 PM   Radiology DG Chest 2 View  Result Date: 06/05/2020 CLINICAL DATA:  Chest pain. EXAM: CHEST - 2 VIEW COMPARISON:  02/08/2020 and older studies. FINDINGS: Stable changes from prior CABG surgery. Cardiac silhouette is normal in size and configuration. No mediastinal or hilar masses. No adenopathy. Lungs are hyperexpanded. Mild interstitial prominence most evident at the bases. Lungs otherwise clear. No pleural effusion or pneumothorax. Skeletal structures are demineralized, but intact. IMPRESSION: 1. No acute cardiopulmonary disease. 2. COPD. Electronically Signed   By: Lajean Manes M.D.   On: 06/05/2020 17:29    Procedures Procedures   Medications Ordered in ED Medications  heparin ADULT infusion 100 units/mL (25000 units/216mL) (650 Units/hr Intravenous New Bag/Given 06/05/20 2004)  acetaminophen (TYLENOL) tablet 650 mg (has no administration in time range)    Or  acetaminophen (TYLENOL) suppository 650 mg (has no administration in time range)  nitroGLYCERIN (NITROSTAT) SL tablet 0.4 mg (has no administration in time range)  atorvastatin (LIPITOR) tablet 40 mg (has no administration in time range)  potassium chloride SA (KLOR-CON) CR tablet 40 mEq (has no administration in time range)  aspirin EC tablet 81 mg (has no administration in time range)  finasteride (PROSCAR) tablet 5 mg (has no administration in time range)  fluticasone (FLONASE) 50 MCG/ACT nasal spray 1-2 spray (has no administration in time range)  predniSONE (DELTASONE) tablet 10 mg (has no administration in time range)  umeclidinium bromide (INCRUSE ELLIPTA) 62.5 MCG/INH 1 puff (has no administration in time range)  vitamin B-12 (CYANOCOBALAMIN) tablet 1,000 mcg (has no administration in time range)  albuterol (PROVENTIL) (2.5 MG/3ML) 0.083% nebulizer solution 2.5 mg (has no administration in time range)  0.9 %  sodium chloride infusion (has  no administration in time range)  budesonide-formoterol (SYMBICORT) 160-4.5 MCG/ACT inhaler 2 puff (has no administration in time range)  aspirin chewable tablet 324 mg (324 mg Oral Given 06/05/20 1919)  lactated ringers bolus 500 mL (0 mLs Intravenous Stopped 06/05/20 2007)  heparin bolus via infusion 3,000 Units (3,000 Units Intravenous Bolus from Bag 06/05/20 2006)    ED Course  I have reviewed the triage vital signs and the nursing notes.  Pertinent labs & imaging results that were available during my care of the patient were reviewed by me and considered in my medical decision making (see chart for details).    MDM Rules/Calculators/A&P                          Patient with severe vascular disease comes in with chest pain sudden onset while resting.  Took nitroglycerin and this helped.  His EKG shows sinus irregularity, right bundle branch, hemodynamically stable rate controlled.  While in the room monitor shows intermittent atrial fibrillation.  His abdomen is soft.  Does have significant edema of lower extremity, need to get all his wounds and the dressings taken down to evaluate for source of infection.  He also get cardiac biomarkers.  Patient has elevation in troponin.  Blood pressure starting to get soft.  We will give some volume for resuscitation.  Given aspirin as well.  Potential concern for acute coronary syndrome.  Heparin will be started as well.  They agree.    The patient will be admitted to the hospitalist.  For the remainder this patient's care please see inpatient team notes.  I will intervene as needed while the patient remains in the emergency department.  Final Clinical Impression(s) / ED Diagnoses Final diagnoses:  Chest pain, unspecified type  Elevated troponin    Rx / DC Orders ED Discharge Orders    None       Breck Coons, MD 06/05/20 2311

## 2020-06-06 ENCOUNTER — Other Ambulatory Visit: Payer: Self-pay

## 2020-06-06 ENCOUNTER — Inpatient Hospital Stay (HOSPITAL_COMMUNITY): Payer: Medicare Other

## 2020-06-06 DIAGNOSIS — I361 Nonrheumatic tricuspid (valve) insufficiency: Secondary | ICD-10-CM

## 2020-06-06 DIAGNOSIS — J9612 Chronic respiratory failure with hypercapnia: Secondary | ICD-10-CM

## 2020-06-06 DIAGNOSIS — I959 Hypotension, unspecified: Secondary | ICD-10-CM | POA: Diagnosis not present

## 2020-06-06 DIAGNOSIS — Z515 Encounter for palliative care: Secondary | ICD-10-CM

## 2020-06-06 DIAGNOSIS — Z7189 Other specified counseling: Secondary | ICD-10-CM

## 2020-06-06 DIAGNOSIS — Z789 Other specified health status: Secondary | ICD-10-CM | POA: Diagnosis not present

## 2020-06-06 DIAGNOSIS — I340 Nonrheumatic mitral (valve) insufficiency: Secondary | ICD-10-CM | POA: Diagnosis not present

## 2020-06-06 DIAGNOSIS — J9611 Chronic respiratory failure with hypoxia: Secondary | ICD-10-CM | POA: Diagnosis not present

## 2020-06-06 DIAGNOSIS — I214 Non-ST elevation (NSTEMI) myocardial infarction: Secondary | ICD-10-CM | POA: Diagnosis not present

## 2020-06-06 DIAGNOSIS — J449 Chronic obstructive pulmonary disease, unspecified: Secondary | ICD-10-CM | POA: Diagnosis not present

## 2020-06-06 DIAGNOSIS — R079 Chest pain, unspecified: Secondary | ICD-10-CM | POA: Diagnosis not present

## 2020-06-06 LAB — ECHOCARDIOGRAM COMPLETE
Area-P 1/2: 5.27 cm2
Calc EF: 56.6 %
S' Lateral: 2.5 cm
Single Plane A2C EF: 48.5 %
Single Plane A4C EF: 62.3 %

## 2020-06-06 LAB — CBC WITH DIFFERENTIAL/PLATELET
Abs Immature Granulocytes: 0.1 10*3/uL — ABNORMAL HIGH (ref 0.00–0.07)
Basophils Absolute: 0 10*3/uL (ref 0.0–0.1)
Basophils Relative: 0 %
Eosinophils Absolute: 0 10*3/uL (ref 0.0–0.5)
Eosinophils Relative: 0 %
HCT: 24.4 % — ABNORMAL LOW (ref 39.0–52.0)
Hemoglobin: 7.8 g/dL — ABNORMAL LOW (ref 13.0–17.0)
Immature Granulocytes: 1 %
Lymphocytes Relative: 2 %
Lymphs Abs: 0.2 10*3/uL — ABNORMAL LOW (ref 0.7–4.0)
MCH: 30.7 pg (ref 26.0–34.0)
MCHC: 32 g/dL (ref 30.0–36.0)
MCV: 96.1 fL (ref 80.0–100.0)
Monocytes Absolute: 0.4 10*3/uL (ref 0.1–1.0)
Monocytes Relative: 3 %
Neutro Abs: 13.5 10*3/uL — ABNORMAL HIGH (ref 1.7–7.7)
Neutrophils Relative %: 94 %
Platelets: 209 10*3/uL (ref 150–400)
RBC: 2.54 MIL/uL — ABNORMAL LOW (ref 4.22–5.81)
RDW: 15.1 % (ref 11.5–15.5)
WBC: 14.2 10*3/uL — ABNORMAL HIGH (ref 4.0–10.5)
nRBC: 0 % (ref 0.0–0.2)

## 2020-06-06 LAB — COMPREHENSIVE METABOLIC PANEL
ALT: 18 U/L (ref 0–44)
AST: 25 U/L (ref 15–41)
Albumin: 2.9 g/dL — ABNORMAL LOW (ref 3.5–5.0)
Alkaline Phosphatase: 59 U/L (ref 38–126)
Anion gap: 6 (ref 5–15)
BUN: 22 mg/dL (ref 8–23)
CO2: 33 mmol/L — ABNORMAL HIGH (ref 22–32)
Calcium: 8.7 mg/dL — ABNORMAL LOW (ref 8.9–10.3)
Chloride: 90 mmol/L — ABNORMAL LOW (ref 98–111)
Creatinine, Ser: 1.17 mg/dL (ref 0.61–1.24)
GFR, Estimated: 60 mL/min — ABNORMAL LOW (ref >60)
Glucose, Bld: 125 mg/dL — ABNORMAL HIGH (ref 70–99)
Potassium: 3.6 mmol/L (ref 3.5–5.1)
Sodium: 129 mmol/L — ABNORMAL LOW (ref 135–145)
Total Bilirubin: 0.8 mg/dL (ref 0.3–1.2)
Total Protein: 4.7 g/dL — ABNORMAL LOW (ref 6.5–8.1)

## 2020-06-06 LAB — TROPONIN I (HIGH SENSITIVITY)
Troponin I (High Sensitivity): 547 ng/L (ref <18)
Troponin I (High Sensitivity): 397 ng/L (ref <18)

## 2020-06-06 LAB — MAGNESIUM: Magnesium: 2 mg/dL (ref 1.7–2.4)

## 2020-06-06 LAB — HEPARIN LEVEL (UNFRACTIONATED): Heparin Unfractionated: 0.21 IU/mL — ABNORMAL LOW (ref 0.30–0.70)

## 2020-06-06 LAB — SARS CORONAVIRUS 2 (TAT 6-24 HRS): SARS Coronavirus 2: NEGATIVE

## 2020-06-06 MED ORDER — ALPRAZOLAM 0.5 MG PO TABS
0.5000 mg | ORAL_TABLET | Freq: Three times a day (TID) | ORAL | Status: DC | PRN
  Administered 2020-06-06 – 2020-06-10 (×7): 0.5 mg via ORAL
  Filled 2020-06-06 (×8): qty 1

## 2020-06-06 MED ORDER — SODIUM CHLORIDE (HYPERTONIC) 5 % OP SOLN
1.0000 [drp] | Freq: Four times a day (QID) | OPHTHALMIC | Status: DC
  Administered 2020-06-06 – 2020-06-10 (×13): 1 [drp] via OPHTHALMIC
  Filled 2020-06-06: qty 15

## 2020-06-06 MED ORDER — FUROSEMIDE 40 MG PO TABS
40.0000 mg | ORAL_TABLET | Freq: Every day | ORAL | Status: DC
  Administered 2020-06-06 – 2020-06-10 (×5): 40 mg via ORAL
  Filled 2020-06-06 (×5): qty 1

## 2020-06-06 MED ORDER — POLYVINYL ALCOHOL 1.4 % OP SOLN
1.0000 [drp] | Freq: Three times a day (TID) | OPHTHALMIC | Status: DC
  Administered 2020-06-06 – 2020-06-10 (×10): 1 [drp] via OPHTHALMIC
  Filled 2020-06-06: qty 15

## 2020-06-06 MED ORDER — ENOXAPARIN SODIUM 60 MG/0.6ML IJ SOSY
60.0000 mg | PREFILLED_SYRINGE | Freq: Two times a day (BID) | INTRAMUSCULAR | Status: DC
  Administered 2020-06-06 – 2020-06-07 (×2): 60 mg via SUBCUTANEOUS
  Filled 2020-06-06 (×2): qty 0.6

## 2020-06-06 MED ORDER — POLYETHYL GLYCOL-PROPYL GLYCOL 0.4-0.3 % OP SOLN: 1.0000 [drp] | Freq: Three times a day (TID) | OPHTHALMIC | Status: DC

## 2020-06-06 MED ORDER — TIOTROPIUM BROMIDE MONOHYDRATE 2.5 MCG/ACT IN AERS
2.0000 | INHALATION_SPRAY | Freq: Every day | RESPIRATORY_TRACT | Status: DC
  Administered 2020-06-06 – 2020-06-08 (×3): 2 via RESPIRATORY_TRACT

## 2020-06-06 MED ORDER — OXYCODONE HCL 5 MG PO TABS: 2.5000 mg | ORAL_TABLET | Freq: Four times a day (QID) | ORAL | Status: DC | PRN

## 2020-06-06 MED ORDER — ACETAMINOPHEN 500 MG PO TABS
500.0000 mg | ORAL_TABLET | Freq: Three times a day (TID) | ORAL | Status: DC
  Administered 2020-06-06 – 2020-06-10 (×10): 500 mg via ORAL
  Filled 2020-06-06 (×12): qty 1

## 2020-06-06 MED ORDER — BUDESONIDE-FORMOTEROL FUMARATE 160-4.5 MCG/ACT IN AERO
2.0000 | INHALATION_SPRAY | Freq: Two times a day (BID) | RESPIRATORY_TRACT | Status: DC
  Administered 2020-06-06 – 2020-06-10 (×8): 2 via RESPIRATORY_TRACT

## 2020-06-06 MED ORDER — FLUOXETINE HCL 10 MG PO CAPS
10.0000 mg | ORAL_CAPSULE | Freq: Every day | ORAL | Status: DC
  Administered 2020-06-07 – 2020-06-09 (×3): 10 mg via ORAL
  Filled 2020-06-06 (×4): qty 1

## 2020-06-06 NOTE — Consult Note (Signed)
Palliative Medicine Inpatient Consult Note  Reason for consult:  Goals of Care "60M w/ end stage COPD on home O2 and prednsione, dCHF, PVD s/p AKA w/ sacral and left leg wound admit w/ NSTEMI. Hospice candidate. Please assist w/ GOC and MDM."  HPI:  Per intake H&P --> [name] is an 87 year old male with past medical history significant for coronary artery disease s/p CABG 3748, chronic diastolic congestive heart failure, chronic hypoxic respiratory failure on 2 L nasal cannula, severe COPD on chronic prednisone therapy, PVD s/p right AKA, HTN, HLD, GERD, BPH, paroxysmal atrial fibrillation not on anticoagulation, chronic hyponatremia, anemia, chronic Foley, who presented to [name], ED on 5/28 with chest pain.  Identified to have an NSTEMI - cardiology involved. Plan to medically optimize on medications.  Palliative care has been asked to get involved setting of multiple comorbid conditions to further discuss goals of care.  Clinical Assessment/Goals of Care:  *Please note that this is a verbal dictation therefore any spelling or grammatical errors are due to the "[name]" system interpretation.  I have reviewed medical records including EPIC notes, labs and imaging, received report from bedside RN, assessed the patient who is lying in bed in no acute distress.    I met with [name] and his wife, [name] to further discuss diagnosis prognosis, GOC, EOL wishes, disposition and options.   I introduced Palliative Medicine as specialized medical care for people living with serious illness. It focuses on providing relief from the symptoms and stress of a serious illness. The goal is to improve quality of life for both the patient and the family.  [name] shares with me that he is from [name], [place].  He has been married to his wife [name] for the past 62 years they share 2 children and 3 grandchildren.  [name] used to work for [employer] and shares with me that  he has great retirement benefits as a result of having worked for them.  Enjoys spending time with his family.  He is a man of faith and practices within the [name] denomination.  To hospitalization [name] was able to assist with transfers and to feed himself.  His wife, [name] assisted with all other activities of daily living.  I reviewed with [name] his multiple comorbid conditions inclusive of his coronary artery disease, severe COPD, heart failure, and peripheral vascular disease requiring a right above-the-knee amputation.  [name] is aware that he is not in the best health.   A detailed discussion was had today regarding advanced directives - we do not have any on file at this time.    Concepts specific to code status, artifical feeding and hydration, continued IV antibiotics and rehospitalization was had.    We reviewed the MOST form in great detail.  [name] shares with me that he would not want chest compressions, intubation, or increased level of care to the ICU.  [name] shares that he would want a trial of cardiac shocking and cardiac medications if needed.  I encouraged him to read the " choices for loving people" booklet to further guide his decisions.  I shared with him I wary even with shocks and even with cardiac medications a true cardiopulmonary arrest would have very poor outcomes given all of his comorbid conditions.   Patient wishes for partial code as below: In the event of cardiac or respiratory ARREST: Initiate Code Blue, Call Rapid Response Yes  In the event of cardiac or respiratory ARREST: Perform  CPR No  In the event of cardiac or respiratory ARREST: Perform Intubation/Mechanical Ventilation No  In the event of cardiac or respiratory ARREST: Use NIPPV/BiPAp only if indicated Yes  In the event of cardiac or respiratory ARREST: Administer ACLS medications if indicated Yes  In the event of cardiac or respiratory ARREST: Perform Defibrillation or Cardioversion if indicated  Yes   Both [name] and his wife agree that at this point in time they do not wish for hospice.  [name] shares with me that he does not want to stop going to his specialist.  He expresses concern that if he were to enroll in hospice his insurance would not someway be affected.  Tried to reassure him that that would not be the case.  Reviewed in detail the role of hospice and care of patient such as himself.  I described hospice as a service for patients for have a life expectancy of < 6 months. It preserves dignity and quality at the end phases of life. The focus changes from curative to symptom relief.  [name] shares with me that [name] has been stable for quite some time though if his situation changes she has already looked into [facility] [place] on her own accord.  [name] prior been receiving palliative care through [name] though [name] is wary that they offer very little support for [name]'s complex symptom management.  We agreed to contact [facility] to identify if they would be willing to take him on as a palliative patient therefore having continuity should he further need hospice in the future.  Symptom perspective [name] expresses quite a lot of arthritic pain.  He shares at home he takes about 3 Tylenol's a day to aid in relief.  He has not been requesting this inpatient therefore we will write him for around-the-clock medication in addition to adding very low-dose oxycodone for as needed breakthrough pain.  [name] also complains of ongoing anxiety - at home he is taking Prozac as well as Xanax 3 times a day.  I shared with him Xanax is not a geriatric friendly drug and I would recommend considering BuSpar.  I reviewed the medications he is on and will ask pharmacy to meet with he and his wife to further discuss geriatric appropriate drugs to manage and maintain anxiety.  The difference between a aggressive medical intervention path  and a palliative comfort care path for this  patient at this time was had. Values and goals of care important to patient and family were attempted to be elicited.  Discussed the importance of continued conversation with family and their  medical providers regarding overall plan of care and treatment options, ensuring decisions are within the context of the patients values and GOCs.  Decision Maker: [name] (spouse) [phone]  SUMMARY OF RECOMMENDATIONS   Partial Code - No chest compression/intubation/no transfer to ICU; Trial of shocks and ACLS medications  Will attempt to complete a MOST form prior to discharge  Chronic arthritic pain: Tylenol 500 mg 3 times daily; oxycodone 2.5 mg every 6 hours as needed  Generalized anxiety: Continue Prozac; patient prior to receiving Xanax 0.5 mg 3 times daily, I noticed this was not continued on hospitalization.  May consider using BuSpar 5 mg twice daily to see if this has an effect on patient's anxiety as it is identified as a safer geriatric drug than comparable benzodiazepines  Appreciate transitions of care team aiding in referral to [facility] for outpatient palliative care  services  Ongoing incremental palliative care support  Code Status/Advance Care Planning: Partial Code - No chest compression/intubation; Trial of shocks and cardiac medications  Palliative Prophylaxis:   Oral care, mobility, delirium precautions  Additional Recommendations (Limitations, Scope, Preferences):  Continue to treat what is treatable  Psycho-social/Spiritual:   Desire for further Chaplaincy support:  No  Additional Recommendations:  Education on chronic disease processes   Prognosis:  Prognosis is exceptionally poor in the setting of multiple comorbid conditions, recurrent rehospitalization's, end-stage disease processes, and increased frailty.  Patient will be appropriate for hospice care.  Discharge Planning:  Discharge will likely be to home with home health and  outpatient palliative support  Vitals:   06/06/20 1119 06/06/20 1611  BP: 117/60 101/68  Pulse: 74   Resp: 18 16  Temp: 97.9 F (36.6 C) 98 F (36.7 C)  SpO2: 96% 95%    Intake/Output Summary (Last 24 hours) at 06/06/2020 1622 Last data filed at 06/06/2020 1500 Gross per 24 hour  Intake 1949.63 ml  Output --  Net 1949.63 ml   Last Weight  Most recent update: 06/06/2020  2:09 PM   Weight  57.8 kg (127 lb 6.4 oz)           Gen: Frail elderly Caucasian man in no acute distress HEENT: moist mucous membranes CV: Regular rate and irregular rhythm PULM: On 2 L/min nasal cannula ABD: soft/nontender EXT: R AKA, LLE (+)edema Neuro: Alert and oriented x3 - hardof hearing  PPS: 40%   This conversation/these recommendations were discussed with patient primary care team, Dr. [place]  Time In: 1510 Time Out: 1640 Total Time: 90 Greater than 50%  of this time was spent counseling and coordinating care related to the above assessment and plan.  [place] Team Team Cell Phone: [phone] Please utilize secure chat with additional questions, if there is no response within 30 minutes please call the above phone number  Palliative Medicine Team providers are available by phone from 7am to 7pm daily and can be reached through the team cell phone.  Should this patient require assistance outside of these hours, please call the patient's attending physician.

## 2020-06-06 NOTE — Progress Notes (Signed)
[facility] for heparin Indication: chest pain/ACS    Heparin Dosing Weight: 55 kg   Vital Signs: Temp: 97.6 F (36.4 C) (05/28 2355) Temp Source: Oral (05/28 2355) BP: 91/52 (05/28 2355) Pulse Rate: 66 (05/28 2355)  Labs: Recent Labs    06/05/20 1600 06/05/20 1804 06/06/20 0052 06/06/20 0412  HGB 9.2*  --  7.8*  --   HCT 29.4*  --  24.4*  --   PLT 272  --  209  --   HEPARINUNFRC  --   --   --  0.21*  CREATININE 1.32*  --   --   --   TROPONINIHS 287* 426*  --   --     Assessment: 55 YOM with severe vascular disease here with chest pain and elevated cardiac markers concerning for ACS. Pharmacy consulted to start IV heparin.   Hg 7.8, PTLC 209  Goal of Therapy:  Heparin level 0.3-0.7 units/ml Monitor platelets by anticoagulation protocol: Yes   Plan:  -Increase heparin to 850 units/hr -F/u 8 hr HL -Monitor daily HL, CBC and s/s of bleeding   Thanks for allowing pharmacy to be a part of this patient's care.  [name], PharmD Clinical Pharmacist

## 2020-06-06 NOTE — Progress Notes (Signed)
  Echocardiogram 2D Echocardiogram has been performed.  [name] 06/06/2020, 10:50 AM

## 2020-06-06 NOTE — Progress Notes (Signed)
Received referral to assist pt with [facility]. Family would like to switch in home palliative care to [facility]. Contacted Cheri with [facility] for referral.

## 2020-06-06 NOTE — Progress Notes (Addendum)
Progress Note  Patient Name: [name] Date of Encounter: 06/06/2020  [facility] Cardiologist: Sinclair Grooms, MD   Subjective   Reviewed events prior to and since admission. His wife is at bedside. He is very hard of hearing. Had IV bleed out today. Reviewed findings of his echo, discussed his known CAD and preferences for management. He has not tolerated ranexa, clopidogrel, or amiodarone in the past. He does not want invasive procedures done. His wife notes (and we discussed) prior recommendations for hospice. Wife does not think patient is ready to accept this yet, and patient states he wants to try medications. See discussion below.   Inpatient Medications    Scheduled Meds: . aspirin EC  81 mg Oral Daily  . atorvastatin  40 mg Oral QHS  . budesonide-formoterol  2 puff Inhalation BID  . finasteride  5 mg Oral Q lunch  . FLUoxetine  10 mg Oral QHS  . fluticasone  1-2 spray Each Nare Daily  . furosemide  40 mg Oral Daily  . predniSONE  10 mg Oral Daily  . umeclidinium bromide  1 puff Inhalation Daily  . vitamin B-12  1,000 mcg Oral Daily   Continuous Infusions: . sodium chloride 75 mL/hr at 06/06/20 0745   PRN Meds: acetaminophen **OR** acetaminophen, albuterol, nitroGLYCERIN   Vital Signs    Vitals:   06/05/20 2313 06/05/20 2355 06/06/20 0838 06/06/20 1119  BP:  (!) 91/52 100/65 117/60  Pulse:  66 63 74  Resp:  17 16 18   Temp:  97.6 F (36.4 C) (!) 97.2 F (36.2 C) 97.9 F (36.6 C)  TempSrc:  Oral Axillary Oral  SpO2: 94% 95%  96%    Intake/Output Summary (Last 24 hours) at 06/06/2020 1204 Last data filed at 06/06/2020 0000 Gross per 24 hour  Intake 360 ml  Output --  Net 360 ml   Last 3 Weights 04/08/2020 04/08/2020 04/06/2020  Weight (lbs) 121 lb 14.6 oz 118 lb 9.7 oz 118 lb 8 oz  Weight (kg) 55.3 kg 53.8 kg 53.751 kg      Telemetry    Sinus rhythm with occasional PACs/PVCs - Personally Reviewed  ECG    SR with PACs, RBBB - Personally  Reviewed  Physical Exam   GEN: Thin, frail appearing elderly gentleman, very hard of hearing Neck: JVD low neck at near 90 degrees Cardiac: distant breath sounds, irregular rhythm, 2/6 systolic murmur Respiratory: distant breath sounds, no wheezing/rales GI: Soft, nontender, non-distended  MS: R AKA, erythema and pitting edema in L foot Neuro:  Nonfocal  Psych: Normal affect  Skin: diffuse ecchymoses  Labs    High Sensitivity Troponin:   Recent Labs  Lab 06/05/20 1600 06/05/20 1804 06/06/20 0052  TROPONINIHS 287* 426* 547*      Chemistry Recent Labs  Lab 06/05/20 1600 06/05/20 2000 06/06/20 0052  NA 130*  --  129*  K 3.6  --  3.6  CL 87*  --  90*  CO2 31  --  33*  GLUCOSE 138*  --  125*  BUN 21  --  22  CREATININE 1.32*  --  1.17  CALCIUM 9.2  --  8.7*  PROT  --  5.4* 4.7*  ALBUMIN  --  3.3* 2.9*  AST  --  29 25  ALT  --  22 18  ALKPHOS  --  69 59  BILITOT  --  0.5 0.8  GFRNONAA 52*  --  60*  ANIONGAP 12  --  6     Hematology Recent Labs  Lab 06/05/20 1600 06/06/20 0052  WBC 17.1* 14.2*  RBC 3.01* 2.54*  HGB 9.2* 7.8*  HCT 29.4* 24.4*  MCV 97.7 96.1  MCH 30.6 30.7  MCHC 31.3 32.0  RDW 15.5 15.1  PLT 272 209    BNP Recent Labs  Lab 06/05/20 1600  BNP 473.5*     DDimer No results for input(s): DDIMER in the last 168 hours.   Radiology    DG Chest 2 View  Result Date: 06/05/2020 CLINICAL DATA:  Chest pain. EXAM: CHEST - 2 VIEW COMPARISON:  02/08/2020 and older studies. FINDINGS: Stable changes from prior CABG surgery. Cardiac silhouette is normal in size and configuration. No mediastinal or hilar masses. No adenopathy. Lungs are hyperexpanded. Mild interstitial prominence most evident at the bases. Lungs otherwise clear. No pleural effusion or pneumothorax. Skeletal structures are demineralized, but intact. IMPRESSION: 1. No acute cardiopulmonary disease. 2. COPD. Electronically Signed   By: Lajean Manes M.D.   On: 06/05/2020 17:29    ECHOCARDIOGRAM COMPLETE  Result Date: 06/06/2020    ECHOCARDIOGRAM REPORT   Patient Name:   [name] [facility] Date of Exam: 06/06/2020 Medical Rec #:  [id number]    Height:       69.0 in Accession #:    [id number]   Weight:       121.9 lb Date of Birth:  1929-03-07    BSA:          1.674 m Patient Age:    87 years     BP:           100/65 mmHg Patient Gender: M            HR:           69 bpm. Exam Location:  Inpatient Procedure: 2D Echo, Cardiac Doppler and Color Doppler Indications:    R07.9* Chest pain, unspecified  History:        Patient has prior history of Echocardiogram examinations, most                 recent 01/28/2020. CHF, Previous Myocardial Infarction and CAD,                 Abnormal ECG, COPD, Arrythmias:Atrial Fibrillation and Atrial                 Flutter, Signs/Symptoms:Shortness of Breath and Dyspnea; Risk                 Factors:Hypertension and Dyslipidemia. Cor pulmonale.  Sonographer:    Roseanna Rainbow RDCS Referring Phys: [id number] Rhetta Mura  Sonographer Comments: Technically difficult study due to poor echo windows, suboptimal parasternal window and suboptimal apical window. Extremely difficult windows. IMPRESSIONS  1. Left ventricular ejection fraction, by estimation, is 60 to 65%. The left ventricle has normal function. The left ventricle has no regional wall motion abnormalities. Left ventricular diastolic parameters were normal.  2. Right ventricular systolic function is low normal. The right ventricular size is mildly enlarged. There is moderately elevated pulmonary artery systolic pressure.  3. The mitral valve is degenerative. Mild mitral valve regurgitation. Moderate mitral annular calcification.  4. Tricuspid valve regurgitation is moderate.  5. The aortic valve is grossly normal. There is mild calcification of the aortic valve. There is mild thickening of the aortic valve. Aortic valve regurgitation is not visualized. Mild aortic valve sclerosis is present, with no evidence of  aortic valve stenosis. Comparison(s): No significant change  from prior study. FINDINGS  Left Ventricle: Left ventricular ejection fraction, by estimation, is 60 to 65%. The left ventricle has normal function. The left ventricle has no regional wall motion abnormalities. The left ventricular internal cavity size was normal in size. There is  no left ventricular hypertrophy. Abnormal (paradoxical) septal motion consistent with post-operative status. Left ventricular diastolic parameters were normal. Right Ventricle: The right ventricular size is mildly enlarged. Right vetricular wall thickness was not well visualized. Right ventricular systolic function is low normal. There is moderately elevated pulmonary artery systolic pressure. The tricuspid regurgitant velocity is 3.01 m/s, and with an assumed right atrial pressure of 15 mmHg, the estimated right ventricular systolic pressure is 93.9 mmHg. Left Atrium: Left atrial size was normal in size. Right Atrium: Right atrial size was normal in size. Pericardium: Trivial pericardial effusion is present. Mitral Valve: The mitral valve is degenerative in appearance. There is mild thickening of the mitral valve leaflet(s). There is mild calcification of the mitral valve leaflet(s). Moderate mitral annular calcification. Mild mitral valve regurgitation. Tricuspid Valve: The tricuspid valve is normal in structure. Tricuspid valve regurgitation is moderate. Aortic Valve: The aortic valve is grossly normal. There is mild calcification of the aortic valve. There is mild thickening of the aortic valve. Aortic valve regurgitation is not visualized. Mild aortic valve sclerosis is present, with no evidence of aortic valve stenosis. Pulmonic Valve: The pulmonic valve was not well visualized. Pulmonic valve regurgitation is not visualized. Aorta: The aortic root, ascending aorta and aortic arch are all structurally normal, with no evidence of dilitation or obstruction. IAS/Shunts: The  atrial septum is grossly normal.  LEFT VENTRICLE PLAX 2D LVIDd:         3.60 cm     Diastology LVIDs:         2.50 cm     LV e' medial:    7.65 cm/s LV PW:         1.10 cm     LV E/e' medial:  13.5 LV IVS:        1.10 cm     LV e' lateral:   8.93 cm/s LVOT diam:     2.30 cm     LV E/e' lateral: 11.5 LV SV:         57 LV SV Index:   34 LVOT Area:     4.15 cm  LV Volumes (MOD) LV vol d, MOD A2C: 36.7 ml LV vol d, MOD A4C: 64.4 ml LV vol s, MOD A2C: 18.9 ml LV vol s, MOD A4C: 24.3 ml LV SV MOD A2C:     17.8 ml LV SV MOD A4C:     64.4 ml LV SV MOD BP:      28.4 ml RIGHT VENTRICLE RV S prime:     7.73 cm/s TAPSE (M-mode): 1.4 cm LEFT ATRIUM           Index       RIGHT ATRIUM           Index LA diam:      3.40 cm 2.03 cm/m  RA Area:     13.10 cm LA Vol (A2C): 19.1 ml 11.41 ml/m RA Volume:   33.90 ml  20.25 ml/m LA Vol (A4C): 19.9 ml 11.89 ml/m  AORTIC VALVE LVOT Vmax:   70.30 cm/s LVOT Vmean:  44.100 cm/s LVOT VTI:    0.138 m  AORTA Ao Root diam: 3.70 cm Ao Asc diam:  3.50 cm MITRAL VALVE  TRICUSPID VALVE MV Area (PHT): 5.27 cm     TR Peak grad:   36.2 mmHg MV Decel Time: 144 msec     TR Vmax:        301.00 cm/s MV E velocity: 103.00 cm/s MV A velocity: 64.00 cm/s   SHUNTS MV E/A ratio:  1.61         Systemic VTI:  0.14 m                             Systemic Diam: 2.30 cm Buford Dresser MD Electronically signed by Buford Dresser MD Signature Date/Time: 06/06/2020/10:57:09 AM    Final     Cardiac Studies   Echo 06/06/20--I personally read, no significant change from prior  Patient Profile     87 y.o. male with PMH CAD s/p CABG, PAD s/p R AKA, COPD on home O2, atrial fibrillation not on anticoagulation due to falls, hypertension, hyperlipidemia who we following in consultation for chest pain at the request of Dr. [place]  Assessment & Plan    Chest pain Elevated troponin Hypotension Known CAD with prior CABG, PAD s/p prior AKA COPD on home O2 Atrial fibrillation,  paroxysmal -hsTn 287 > 426 > 547 -lactate normal, procalcitonin 0.19 -taken together, this suggests NSTEMI. However, agree with consult note that other etiology cannot be excluded. He has potential for infection with elevated WBC, though lactate and procalcitonin suggest this is unlikely to be septic shock. It could be demand ischemia given known disease, but with continuing elevation in enzymes, this is more concerning for MI.  I reviewed his prior notes and discussion extensively. Of note, at a meeting with [name] on 03/01/20, they discussed that he has no options for revascularization. Son interested in hospice. After discussion at that visit, he was amenable to hospice evaluation.  I discussed this with the patient today. Reviewed his findings, testing, known CVD. After shared decision making, he is not ready to fully commit to hospice, but he does not want any invasive testing done.  Recommend 48 hours of anticoagulation (will change to lovenox as IV site had to be changed due to bleeding). Continue aspirin. Atorvastatin changed this admission, unclear the long term utility of this but he is tolerating. He is edematous, but blood pressure borderline, so will restart oral lasix. Will hold metoprolol for now.  Overall poor prognosis, but respect patient's wishes. May be useful to have palliative care see him this admission to discuss overall goals of care (is very hard of hearing, would be helpful to have family present as well).  Difficult situation, multiple severe comorbidities, high level complexity of decision making  For questions or updates, please contact [facility] Please consult [email or web address] for contact info under     Signed, Buford Dresser, MD  06/06/2020, 12:04 PM

## 2020-06-06 NOTE — Progress Notes (Signed)
PROGRESS NOTE    [name]  LKJ:[id number] DOB: 12/23/1929 DOA: 06/05/2020 PCP: [name], MD    Brief Narrative:  [name] is an 87 year old male with past medical history significant for coronary artery disease s/p CABG 7948, chronic diastolic congestive heart failure, chronic hypoxic respiratory failure on 2 L nasal cannula, severe COPD on chronic prednisone therapy, PVD s/p right AKA, HTN, HLD, GERD, BPH, paroxysmal atrial fibrillation not on anticoagulation, chronic hyponatremia, anemia, chronic Foley, who presented to [name] ED on 5/28 with chest pain.  History obtained with assistance of his spouse.  Patient reports awakening with left-sided chest pressure radiating to the right portion of the chest and into bilateral arms.  Pain was constant lasting 15-20 minutes before resolving after administration of 2 sublingual nitroglycerin tablets.  Patient denied any radiation to his back or between his scapula.  He has remained chest pain-free since.  No known exacerbating factors, pain was nonpleuritic, nonpositional and not reproducible with palpation of anterior chest wall.  Further denies any shortness of breath more than his normal baseline, no fever/chills,, no palpitations, no diaphoresis, no nausea/vomiting, no dizziness, no presyncope or syncope.  No recent trauma/travel, no recent sick contacts.  In the ED, temperature 99.2 F, HR 93, RR 18, BP 90/51, SPO2 99% on 2 L nasal cannula.  Sodium 130, potassium 3.6, chloride 87, CO2 31, glucose 138, BUN 21, creatinine 1.32.  WBC 17.1, hemoglobin 9.2, platelets 272.  BNP 473.5.  High-sensitivity troponin [phone].  COVID-19 test negative.  Chest x-ray with no acute cardiopulmonary disease process.  EKG with normal sinus rhythm, rate 95, QTc 452, T wave inversion noted in V1, V2 which is similar in appearance to EKG dated 04/08/2020.  Patient was started on heparin drip.  Cardiology consulted.  TRH consulted for further evaluation  and management of NSTEMI.   Assessment & Plan:   Principal Problem:   NSTEMI (non-ST elevated myocardial infarction) ([place]) Active Problems:   Hyperlipidemia   COPD   GOLD IV/ 02/steroid dep    Chronic respiratory failure with hypoxia and hypercapnia (HCC)   Elevated troponin   Chronic diastolic CHF (congestive heart failure) (HCC)   Hypotension   Atrial fibrillation ([place])   NSTEMI Patient presenting to the ED following acute onset chest pain after awakening, described as a pressure-like sensation with radiation to bilateral arms.  Relieved with 2 doses of nitroglycerin.  High sensory troponin elevated on admission, [phone].  EKG with NSR, no significant change from previous EKG.  Chest x-ray unrevealing.  TTE with LVEF 60-65%, LV with no regional wall motion abnormalities, RV mildly enlarged, moderate TR, mild MR. --Cardiology following, appreciate assistance --Heparin drip --Aspirin 81 mg p.o. daily --Atorvastatin increased to 40 mg p.o. daily --Continue to monitor on telemetry  Hypotension Initial presentation, patient blood pressure noted to be 90/51, improved with IV fluid resuscitation.  Etiology likely secondary to hypovolemia in the setting of dehydration. --BP improved, 100/65 this morning --Holding home furosemide, metoprolol succinate, tamsulosin --Continue NS at 75 mL/h --Continue monitor BP closely  Coronary artery disease Patient underwent CABG 2003 with LIMA to LAD, SVG to OM1-2, SVG to RCA and PDA.  --Atorvastatin increased to 40 mg p.o. daily --Aspirin 81 mg p.o. daily  Chronic hypoxic respiratory failure Gold stage IV COPD, steroid-dependent Prior extensive tobacco use, quit 40 years ago.  On 2 L nasal cannula at baseline.  No evidence of acute exacerbation at this time. --Continue home Spiriva and Symbicort --Prednisone 10 mg  p.o. daily --Xopenex neb as needed --Trilogy bipap qHS  Chronic diastolic congestive heart failure TTE with LVEF 60-65%, LV  with no regional wall motion abnormalities, RV mildly enlarged, moderate TR, mild MR. On furosemide 40 mg p.o. daily at home.  Chest x-ray with no pulmonary edema.  Patient with hypotension, suspect dehydration as above. --Continue to hold Lasix --Strict I's and O's and daily weights  Paroxysmal atrial fibrillation Not on chronic anticoagulation due to history of recurrent falls.  On metoprolol succinate at home.  EKG and telemetry notable for normal sinus rhythm. --Holding metoprolol due to hypotension --Continue to monitor on telemetry  Chronic hypoosmolar hyponatremia Baseline sodium 128-132.  Sodium 130 on admission, at baseline. -- BMP daily  Chronic normocytic anemia Baseline hemoglobin seven-point 5-10.  Presenting hemoglobin 9.2, within range but suspect hemoconcentration in the setting of dehydration as above. --Hgb 9.2>7.8 --CBC daily  BPH --Holding home tamsulosin in the setting of hypotension as above --Continue finasteride --Continue with chronic Foley catheter, follows with urology, [name], reports last Foley exchange on 05/21/2020  Depression: Continue fluoxetine 10 mg p.o. daily  HLD --Atorvastatin increased to 40 mg p.o. daily  Stage III sacral decubitus ulcer, POA Left medial lower extremity wound, POA Pressure Injury 01/24/20 Coccyx Mid;Lower Stage 2 -  Partial thickness loss of dermis presenting as a shallow open injury with a red, pink wound bed without slough. (Active)  01/24/20 1445  Location: Coccyx  Location Orientation: Mid;Lower  Staging: Stage 2 -  Partial thickness loss of dermis presenting as a shallow open injury with a red, pink wound bed without slough.  Wound Description (Comments):   Present on Admission: Yes     Pressure Injury 02/06/20 Buttocks Left Stage 2 -  Partial thickness loss of dermis presenting as a shallow open injury with a red, pink wound bed without slough. Above previous wound (Active)  02/06/20 (noted by [facility] nurse on  consult) 1459  Location: Buttocks  Location Orientation: Left  Staging: Stage 2 -  Partial thickness loss of dermis presenting as a shallow open injury with a red, pink wound bed without slough.  Wound Description (Comments): Above previous wound  Present on Admission: Yes     Pressure Injury 04/08/20 Buttocks Medial Stage 3 -  Full thickness tissue loss. Subcutaneous fat may be visible but bone, tendon or muscle are NOT exposed. (Active)  04/08/20 1500  Location: Buttocks  Location Orientation: Medial  Staging: Stage 3 -  Full thickness tissue loss. Subcutaneous fat may be visible but bone, tendon or muscle are NOT exposed.  Wound Description (Comments):   Present on Admission: Yes  --Patient followed by the wound care clinic outpatient. --wound care consultation    DVT prophylaxis: Heparin drip    Code Status: Partial Code Family Communication: Updated spouse present at bedside  Disposition Plan:  Level of care: Progressive Status is: Inpatient  Remains inpatient appropriate because:Ongoing diagnostic testing needed not appropriate for outpatient work up, Unsafe d/c plan, IV treatments appropriate due to intensity of illness or inability to take PO and Inpatient level of care appropriate due to severity of illness   Dispo: The patient is from: Home              Anticipated d/c is to: Home              Patient currently is not medically stable to d/c.   Difficult to place patient No   Consultants:   Cardiology  Procedures:   TTE  Antimicrobials:   None   Subjective: Patient seen and examined at bedside, resting comfortably.  On trilogy BiPAP this morning.  Spouse present.  Patient states continues to be chest pain-free.  No significant shortness of breath more than his normal baseline.  No other specific concerns or complaints this morning.  Denies headache, no dizziness, no chest pain, no palpitations, no shortness of breath, no fever/chills/night sweats, no  nausea/vomiting, no abdominal pain.  No acute events overnight per nursing staff.  Objective: Vitals:   06/05/20 2308 06/05/20 2313 06/05/20 2355 06/06/20 0838  BP: 109/67  (!) 91/52 100/65  Pulse: 78  66 63  Resp: 18  17 16   Temp: 98.1 F (36.7 C)  97.6 F (36.4 C) (!) 97.2 F (36.2 C)  TempSrc: Oral  Oral Axillary  SpO2: 94% 94% 95%     Intake/Output Summary (Last 24 hours) at 06/06/2020 1052 Last data filed at 06/06/2020 0000 Gross per 24 hour  Intake 360 ml  Output --  Net 360 ml   There were no vitals filed for this visit.  Examination:  General exam: Appears calm and comfortable, elderly and chronically ill in appearance Respiratory system: Clear to auscultation. Respiratory effort normal.  On 2 L nasal cannula which is his baseline Cardiovascular system: S1 & S2 heard, RRR. No JVD, murmurs, rubs, gallops or clicks.  Left foot with mild edema to ankle. Gastrointestinal system: Abdomen is nondistended, soft and nontender. No organomegaly or masses felt. Normal bowel sounds heard. Central nervous system: Alert and oriented. No focal neurological deficits. Extremities: Right AKA noted, moves left lower extremity independently Skin: Multiple areas of ecchymosis to extremities, noted sacral ulcer and left medial wound Psychiatry: Judgement and insight appear normal. Mood & affect appropriate.       Data Reviewed: I have personally reviewed following labs and imaging studies  CBC: Recent Labs  Lab 06/05/20 1600 06/06/20 0052  WBC 17.1* 14.2*  NEUTROABS  --  13.5*  HGB 9.2* 7.8*  HCT 29.4* 24.4*  MCV 97.7 96.1  PLT 272 497   Basic Metabolic Panel: Recent Labs  Lab 06/05/20 1600 06/06/20 0052  NA 130* 129*  K 3.6 3.6  CL 87* 90*  CO2 31 33*  GLUCOSE 138* 125*  BUN 21 22  CREATININE 1.32* 1.17  CALCIUM 9.2 8.7*  MG 2.0 2.0   GFR: CrCl cannot be calculated (Unknown ideal weight.). Liver Function Tests: Recent Labs  Lab 06/05/20 2000 06/06/20 0052   AST 29 25  ALT 22 18  ALKPHOS 69 59  BILITOT 0.5 0.8  PROT 5.4* 4.7*  ALBUMIN 3.3* 2.9*   No results for input(s): LIPASE, AMYLASE in the last 168 hours. No results for input(s): AMMONIA in the last 168 hours. Coagulation Profile: No results for input(s): INR, PROTIME in the last 168 hours. Cardiac Enzymes: No results for input(s): CKTOTAL, CKMB, CKMBINDEX, TROPONINI in the last 168 hours. BNP (last 3 results) No results for input(s): PROBNP in the last 8760 hours. HbA1C: No results for input(s): HGBA1C in the last 72 hours. CBG: No results for input(s): GLUCAP in the last 168 hours. Lipid Profile: No results for input(s): CHOL, HDL, LDLCALC, TRIG, CHOLHDL, LDLDIRECT in the last 72 hours. Thyroid Function Tests: No results for input(s): TSH, T4TOTAL, FREET4, T3FREE, THYROIDAB in the last 72 hours. Anemia Panel: No results for input(s): VITAMINB12, FOLATE, FERRITIN, TIBC, IRON, RETICCTPCT in the last 72 hours. Sepsis Labs: Recent Labs  Lab 06/05/20 2000 06/05/20 2018  PROCALCITON 0.19  --  LATICACIDVEN  --  1.2    Recent Results (from the past 240 hour(s))  SARS CORONAVIRUS 2 (TAT 6-24 HRS) Nasopharyngeal Nasopharyngeal Swab     Status: None   Collection Time: 06/05/20  4:36 PM   Specimen: Nasopharyngeal Swab  Result Value Ref Range Status   SARS Coronavirus 2 NEGATIVE NEGATIVE Final    Comment: (NOTE) SARS-CoV-2 target nucleic acids are NOT DETECTED.  The SARS-CoV-2 RNA is generally detectable in upper and lower respiratory specimens during the acute phase of infection. Negative results do not preclude SARS-CoV-2 infection, do not rule out co-infections with other pathogens, and should not be used as the sole basis for treatment or other patient management decisions. Negative results must be combined with clinical observations, patient history, and epidemiological information. The expected result is Negative.  Fact Sheet for  Patients: SugarRoll.be  Fact Sheet for Healthcare Providers: https://www.woods-mathews.com/  This test is not yet approved or cleared by the [place] FDA and  has been authorized for detection and/or diagnosis of SARS-CoV-2 by FDA under an Emergency Use Authorization (EUA). This EUA will remain  in effect (meaning this test can be used) for the duration of the COVID-19 declaration under Se ction 564(b)(1) of the Act, 21 U.S.C. section 360bbb-3(b)(1), unless the authorization is terminated or revoked sooner.  Performed at [facility], [place] [address], [place], [place] [address]          Radiology Studies: DG Chest 2 View  Result Date: 06/05/2020 CLINICAL DATA:  Chest pain. EXAM: CHEST - 2 VIEW COMPARISON:  02/08/2020 and older studies. FINDINGS: Stable changes from prior CABG surgery. Cardiac silhouette is normal in size and configuration. No mediastinal or hilar masses. No adenopathy. Lungs are hyperexpanded. Mild interstitial prominence most evident at the bases. Lungs otherwise clear. No pleural effusion or pneumothorax. Skeletal structures are demineralized, but intact. IMPRESSION: 1. No acute cardiopulmonary disease. 2. COPD. Electronically Signed   By: Lajean Manes M.D.   On: 06/05/2020 17:29        Scheduled Meds: . aspirin EC  81 mg Oral Daily  . atorvastatin  40 mg Oral QHS  . budesonide-formoterol  2 puff Inhalation BID  . finasteride  5 mg Oral Q lunch  . fluticasone  1-2 spray Each Nare Daily  . predniSONE  10 mg Oral Daily  . umeclidinium bromide  1 puff Inhalation Daily  . vitamin B-12  1,000 mcg Oral Daily   Continuous Infusions: . sodium chloride 75 mL/hr at 06/06/20 0745  . heparin 750 Units/hr (06/06/20 0531)     LOS: 1 day    Time spent: 42 minutes spent on chart review, discussion with nursing staff, consultants, updating family and interview/physical exam; more than 50% of that time was spent in  counseling and/or coordination of care.    Raygen Dahm J [place] DO [facility] Available via Epic secure chat 7am-7pm After these hours, please refer to coverage provider listed on [email or web address] 06/06/2020, 10:52 AM

## 2020-06-06 NOTE — Progress Notes (Signed)
Patient has home BiPAP unit at bedside. Patient is able to apply with no assistance. RT will continue to monitor.

## 2020-06-07 DIAGNOSIS — I214 Non-ST elevation (NSTEMI) myocardial infarction: Principal | ICD-10-CM

## 2020-06-07 LAB — RETICULOCYTES
Immature Retic Fract: 7.7 % (ref 2.3–15.9)
RBC.: 2.63 MIL/uL — ABNORMAL LOW (ref 4.22–5.81)
Retic Count, Absolute: 37.1 10*3/uL (ref 19.0–186.0)
Retic Ct Pct: 1.4 % (ref 0.4–3.1)

## 2020-06-07 LAB — CBC
HCT: 23 % — ABNORMAL LOW (ref 39.0–52.0)
Hemoglobin: 7.4 g/dL — ABNORMAL LOW (ref 13.0–17.0)
MCH: 30.6 pg (ref 26.0–34.0)
MCHC: 32.2 g/dL (ref 30.0–36.0)
MCV: 95 fL (ref 80.0–100.0)
Platelets: 213 10*3/uL (ref 150–400)
RBC: 2.42 MIL/uL — ABNORMAL LOW (ref 4.22–5.81)
RDW: 14.8 % (ref 11.5–15.5)
WBC: 9.3 10*3/uL (ref 4.0–10.5)
nRBC: 0 % (ref 0.0–0.2)

## 2020-06-07 LAB — BRAIN NATRIURETIC PEPTIDE: B Natriuretic Peptide: 407 pg/mL — ABNORMAL HIGH (ref 0.0–100.0)

## 2020-06-07 LAB — BASIC METABOLIC PANEL
Anion gap: 6 (ref 5–15)
BUN: 25 mg/dL — ABNORMAL HIGH (ref 8–23)
CO2: 33 mmol/L — ABNORMAL HIGH (ref 22–32)
Calcium: 8.6 mg/dL — ABNORMAL LOW (ref 8.9–10.3)
Chloride: 88 mmol/L — ABNORMAL LOW (ref 98–111)
Creatinine, Ser: 1.12 mg/dL (ref 0.61–1.24)
GFR, Estimated: >60 mL/min (ref >60)
Glucose, Bld: 107 mg/dL — ABNORMAL HIGH (ref 70–99)
Potassium: 3.3 mmol/L — ABNORMAL LOW (ref 3.5–5.1)
Sodium: 127 mmol/L — ABNORMAL LOW (ref 135–145)

## 2020-06-07 LAB — TRANSFERRIN: Transferrin: 215 mg/dL (ref 180–329)

## 2020-06-07 LAB — FERRITIN: Ferritin: 58 ng/mL (ref 24–336)

## 2020-06-07 LAB — IRON AND TIBC
Iron: 18 ug/dL — ABNORMAL LOW (ref 45–182)
Saturation Ratios: 6 % — ABNORMAL LOW (ref 17.9–39.5)
TIBC: 301 ug/dL (ref 250–450)
UIBC: 283 ug/dL

## 2020-06-07 MED ORDER — METOPROLOL TARTRATE 5 MG/5ML IV SOLN
5.0000 mg | Freq: Once | INTRAVENOUS | Status: AC
  Administered 2020-06-07: 5 mg via INTRAVENOUS
  Filled 2020-06-07: qty 5

## 2020-06-07 MED ORDER — METOPROLOL TARTRATE 12.5 MG HALF TABLET: 12.5000 mg | ORAL_TABLET | Freq: Two times a day (BID) | ORAL | Status: DC

## 2020-06-07 MED ORDER — FAMOTIDINE 20 MG PO TABS
20.0000 mg | ORAL_TABLET | Freq: Every day | ORAL | Status: DC
  Administered 2020-06-08 – 2020-06-09 (×2): 20 mg via ORAL
  Filled 2020-06-07 (×2): qty 1

## 2020-06-07 MED ORDER — POTASSIUM CHLORIDE CRYS ER 20 MEQ PO TBCR
30.0000 meq | EXTENDED_RELEASE_TABLET | ORAL | Status: AC
  Administered 2020-06-07: 30 meq via ORAL
  Filled 2020-06-07: qty 1

## 2020-06-07 MED ORDER — PANTOPRAZOLE SODIUM 40 MG PO TBEC
40.0000 mg | DELAYED_RELEASE_TABLET | Freq: Every day | ORAL | Status: DC
  Administered 2020-06-08 – 2020-06-09 (×2): 40 mg via ORAL
  Filled 2020-06-07 (×2): qty 1

## 2020-06-07 MED ORDER — GUAIFENESIN ER 600 MG PO TB12
600.0000 mg | ORAL_TABLET | Freq: Two times a day (BID) | ORAL | Status: DC
  Administered 2020-06-07 – 2020-06-10 (×7): 600 mg via ORAL
  Filled 2020-06-07 (×7): qty 1

## 2020-06-07 MED ORDER — POTASSIUM CHLORIDE CRYS ER 20 MEQ PO TBCR
30.0000 meq | EXTENDED_RELEASE_TABLET | Freq: Once | ORAL | Status: AC
  Administered 2020-06-07: 30 meq via ORAL
  Filled 2020-06-07: qty 1

## 2020-06-07 MED ORDER — METOPROLOL TARTRATE 12.5 MG HALF TABLET
12.5000 mg | ORAL_TABLET | Freq: Three times a day (TID) | ORAL | Status: DC
  Administered 2020-06-07 – 2020-06-10 (×9): 12.5 mg via ORAL
  Filled 2020-06-07 (×9): qty 1

## 2020-06-07 NOTE — Progress Notes (Signed)
Progress Note  Patient Name: [name] Date of Encounter: 06/07/2020  [facility] Cardiologist: Sinclair Grooms, MD   Subjective   Breathing is OK now   DId have some chest discomfort earlier this am after moving around for bath/bathroom   None now  "When can I go home?"  Inpatient Medications    Scheduled Meds: . acetaminophen  500 mg Oral TID  . aspirin EC  81 mg Oral Daily  . atorvastatin  40 mg Oral QHS  . budesonide-formoterol  2 puff Inhalation BID  . enoxaparin (LOVENOX) injection  60 mg Subcutaneous Q12H  . finasteride  5 mg Oral Q lunch  . FLUoxetine  10 mg Oral QHS  . fluticasone  1-2 spray Each Nare Daily  . furosemide  40 mg Oral Daily  . polyvinyl alcohol  1 drop Both Eyes TID  . potassium chloride  30 mEq Oral Q3H  . predniSONE  10 mg Oral Daily  . sodium chloride  1 drop Both Eyes QID  . Tiotropium Bromide Monohydrate  2 puff Inhalation Q0600  . vitamin B-12  1,000 mcg Oral Daily   Continuous Infusions: . sodium chloride 75 mL/hr at 06/07/20 0653   PRN Meds: acetaminophen **OR** acetaminophen, albuterol, ALPRAZolam, nitroGLYCERIN, oxyCODONE   Vital Signs    Vitals:   06/06/20 1956 06/06/20 2041 06/06/20 2230 06/07/20 0451  BP:  121/74  112/70  Pulse:  78  81  Resp:  12  18  Temp:  98.2 F (36.8 C)  98.7 F (37.1 C)  TempSrc:  Oral  Axillary  SpO2: 100% 100% 95% 100%  Weight:    58 kg    Intake/Output Summary (Last 24 hours) at 06/07/2020 0925 Last data filed at 06/07/2020 0700 Gross per 24 hour  Intake 2517.52 ml  Output 1350 ml  Net 1167.52 ml   Last 3 Weights 06/07/2020 06/06/2020 04/08/2020  Weight (lbs) 127 lb 13.9 oz 127 lb 6.4 oz 121 lb 14.6 oz  Weight (kg) 58 kg 57.788 kg 55.3 kg      Telemetry    Sinus rhythm and atrial flutter 80s to 120s   - Personally Reviewed  ECG    No new  - Personally Reviewed  Physical Exam   GEN: No acute distress.   Neck: JVP is normal   Cardiac: RRR, no murmurs   Respiratory: Moving  air Rhonchi GI: Soft, nontender, non-distended  MS: s/p R AKA   L leg with bandage   1+ edema feet   Neuro:  Nonfocal  Psych: Normal affect   Labs    High Sensitivity Troponin:   Recent Labs  Lab 06/05/20 1600 06/05/20 1804 06/06/20 0052 06/06/20 0444  TROPONINIHS 287* 426* 547* 397*      Chemistry Recent Labs  Lab 06/05/20 1600 06/05/20 2000 06/06/20 0052 06/07/20 0122  NA 130*  --  129* 127*  K 3.6  --  3.6 3.3*  CL 87*  --  90* 88*  CO2 31  --  33* 33*  GLUCOSE 138*  --  125* 107*  BUN 21  --  22 25*  CREATININE 1.32*  --  1.17 1.12  CALCIUM 9.2  --  8.7* 8.6*  PROT  --  5.4* 4.7*  --   ALBUMIN  --  3.3* 2.9*  --   AST  --  29 25  --   ALT  --  22 18  --   ALKPHOS  --  69 59  --  BILITOT  --  0.5 0.8  --   GFRNONAA 52*  --  60* >60  ANIONGAP 12  --  6 6     Hematology Recent Labs  Lab 06/05/20 1600 06/06/20 0052 06/07/20 0122  WBC 17.1* 14.2* 9.3  RBC 3.01* 2.54* 2.42*  HGB 9.2* 7.8* 7.4*  HCT 29.4* 24.4* 23.0*  MCV 97.7 96.1 95.0  MCH 30.6 30.7 30.6  MCHC 31.3 32.0 32.2  RDW 15.5 15.1 14.8  PLT 272 209 213    BNP Recent Labs  Lab 06/05/20 1600  BNP 473.5*     DDimer No results for input(s): DDIMER in the last 168 hours.   Radiology    DG Chest 2 View  Result Date: 06/05/2020 CLINICAL DATA:  Chest pain. EXAM: CHEST - 2 VIEW COMPARISON:  02/08/2020 and older studies. FINDINGS: Stable changes from prior CABG surgery. Cardiac silhouette is normal in size and configuration. No mediastinal or hilar masses. No adenopathy. Lungs are hyperexpanded. Mild interstitial prominence most evident at the bases. Lungs otherwise clear. No pleural effusion or pneumothorax. Skeletal structures are demineralized, but intact. IMPRESSION: 1. No acute cardiopulmonary disease. 2. COPD. Electronically Signed   By: Lajean Manes M.D.   On: 06/05/2020 17:29   ECHOCARDIOGRAM COMPLETE  Result Date: 06/06/2020    ECHOCARDIOGRAM REPORT   Patient Name:   [name] [facility]  Date of Exam: 06/06/2020 Medical Rec #:  [id number]    Height:       69.0 in Accession #:    [id number]   Weight:       121.9 lb Date of Birth:  July 14, 1929    BSA:          1.674 m Patient Age:    87 years     BP:           100/65 mmHg Patient Gender: M            HR:           69 bpm. Exam Location:  Inpatient Procedure: 2D Echo, Cardiac Doppler and Color Doppler Indications:    R07.9* Chest pain, unspecified  History:        Patient has prior history of Echocardiogram examinations, most                 recent 01/28/2020. CHF, Previous Myocardial Infarction and CAD,                 Abnormal ECG, COPD, Arrythmias:Atrial Fibrillation and Atrial                 Flutter, Signs/Symptoms:Shortness of Breath and Dyspnea; Risk                 Factors:Hypertension and Dyslipidemia. Cor pulmonale.  Sonographer:    Roseanna Rainbow RDCS Referring Phys: [id number] Rhetta Mura  Sonographer Comments: Technically difficult study due to poor echo windows, suboptimal parasternal window and suboptimal apical window. Extremely difficult windows. IMPRESSIONS  1. Left ventricular ejection fraction, by estimation, is 60 to 65%. The left ventricle has normal function. The left ventricle has no regional wall motion abnormalities. Left ventricular diastolic parameters were normal.  2. Right ventricular systolic function is low normal. The right ventricular size is mildly enlarged. There is moderately elevated pulmonary artery systolic pressure.  3. The mitral valve is degenerative. Mild mitral valve regurgitation. Moderate mitral annular calcification.  4. Tricuspid valve regurgitation is moderate.  5. The aortic valve is grossly normal. There is mild calcification of  the aortic valve. There is mild thickening of the aortic valve. Aortic valve regurgitation is not visualized. Mild aortic valve sclerosis is present, with no evidence of aortic valve stenosis. Comparison(s): No significant change from prior study. FINDINGS  Left Ventricle: Left  ventricular ejection fraction, by estimation, is 60 to 65%. The left ventricle has normal function. The left ventricle has no regional wall motion abnormalities. The left ventricular internal cavity size was normal in size. There is  no left ventricular hypertrophy. Abnormal (paradoxical) septal motion consistent with post-operative status. Left ventricular diastolic parameters were normal. Right Ventricle: The right ventricular size is mildly enlarged. Right vetricular wall thickness was not well visualized. Right ventricular systolic function is low normal. There is moderately elevated pulmonary artery systolic pressure. The tricuspid regurgitant velocity is 3.01 m/s, and with an assumed right atrial pressure of 15 mmHg, the estimated right ventricular systolic pressure is 74.2 mmHg. Left Atrium: Left atrial size was normal in size. Right Atrium: Right atrial size was normal in size. Pericardium: Trivial pericardial effusion is present. Mitral Valve: The mitral valve is degenerative in appearance. There is mild thickening of the mitral valve leaflet(s). There is mild calcification of the mitral valve leaflet(s). Moderate mitral annular calcification. Mild mitral valve regurgitation. Tricuspid Valve: The tricuspid valve is normal in structure. Tricuspid valve regurgitation is moderate. Aortic Valve: The aortic valve is grossly normal. There is mild calcification of the aortic valve. There is mild thickening of the aortic valve. Aortic valve regurgitation is not visualized. Mild aortic valve sclerosis is present, with no evidence of aortic valve stenosis. Pulmonic Valve: The pulmonic valve was not well visualized. Pulmonic valve regurgitation is not visualized. Aorta: The aortic root, ascending aorta and aortic arch are all structurally normal, with no evidence of dilitation or obstruction. IAS/Shunts: The atrial septum is grossly normal.  LEFT VENTRICLE PLAX 2D LVIDd:         3.60 cm     Diastology LVIDs:          2.50 cm     LV e' medial:    7.65 cm/s LV PW:         1.10 cm     LV E/e' medial:  13.5 LV IVS:        1.10 cm     LV e' lateral:   8.93 cm/s LVOT diam:     2.30 cm     LV E/e' lateral: 11.5 LV SV:         57 LV SV Index:   34 LVOT Area:     4.15 cm  LV Volumes (MOD) LV vol d, MOD A2C: 36.7 ml LV vol d, MOD A4C: 64.4 ml LV vol s, MOD A2C: 18.9 ml LV vol s, MOD A4C: 24.3 ml LV SV MOD A2C:     17.8 ml LV SV MOD A4C:     64.4 ml LV SV MOD BP:      28.4 ml RIGHT VENTRICLE RV S prime:     7.73 cm/s TAPSE (M-mode): 1.4 cm LEFT ATRIUM           Index       RIGHT ATRIUM           Index LA diam:      3.40 cm 2.03 cm/m  RA Area:     13.10 cm LA Vol (A2C): 19.1 ml 11.41 ml/m RA Volume:   33.90 ml  20.25 ml/m LA Vol (A4C): 19.9 ml 11.89 ml/m  AORTIC VALVE LVOT  Vmax:   70.30 cm/s LVOT Vmean:  44.100 cm/s LVOT VTI:    0.138 m  AORTA Ao Root diam: 3.70 cm Ao Asc diam:  3.50 cm MITRAL VALVE                TRICUSPID VALVE MV Area (PHT): 5.27 cm     TR Peak grad:   36.2 mmHg MV Decel Time: 144 msec     TR Vmax:        301.00 cm/s MV E velocity: 103.00 cm/s MV A velocity: 64.00 cm/s   SHUNTS MV E/A ratio:  1.61         Systemic VTI:  0.14 m                             Systemic Diam: 2.30 cm Buford Dresser MD Electronically signed by Buford Dresser MD Signature Date/Time: 06/06/2020/10:57:09 AM    Final     Cardiac Studies   Echo   06/07/20  1. Left ventricular ejection fraction, by estimation, is 60 to 65%. The left ventricle has normal function. The left ventricle has no regional wall motion abnormalities. Left ventricular diastolic parameters were normal. 2. Right ventricular systolic function is low normal. The right ventricular size is mildly enlarged. There is moderately elevated pulmonary artery systolic pressure. 3. The mitral valve is degenerative. Mild mitral valve regurgitation. Moderate mitral annular calcification. 4. Tricuspid valve regurgitation is moderate. 5. The aortic valve is  grossly normal. There is mild calcification of the aortic valve. There is mild thickening of the aortic valve. Aortic valve regurgitation is not visualized. Mild aortic valve sclerosis is present, with no evidence of aortic valve stenosis. Comparison(s): No significant change from prior study.  Patient Profile      87 y.o. male with PMH CAD s/p CABG, PAD s/p R AKA, COPD on home O2, atrial fibrillation not on anticoagulation due to falls, hypertension, hyperlipidemia who we following in consultation for chest pain at the request of Dr. [place]  Assessment & Plan    1  CAD / NSTEMI  Pt with known severe  CAD s/p CABG 2003   Presents with CP and elevated troppnin.   This has peaked (500s)  Echo unchanged  Normal LVEF    Pt has been on lovenox since admit   With drop in Hg today I would stop   Feel risk above benefit. Add b blocker and follow HR Stop IV fluids  Check BNP   2  Rhythm   PT in and out of atrial flutter   Not on long term anticoag due to falls   Ovreall rate not too bad   Note that pt had been on amiodarone 200 bid earlier this spring after episode of afib with RVR   He did not tolerate it and it was discontinued in APril 2022) Had been on toprol XL 25 at home   WIll start low dose b blocker 12.5 tid metoprolol   Follow BP and HR      3  PVOD   Severe PAD with claudication  He is s/p R AKA 2021;  Rx for cellulitis Being followed in wound care  4. ANemia    H/H 7.4/23   Plt 213   7.8 admit (may be dilutional)  (OUtpt  March Hgb was 10.1 in March; 7.9 APril 1)  Normocytic, normochromic   Fe studies difficult in setting of other med problems   Fe, Fe  sat were low but Ferr normal   WIll repeat Fe studies   5  COPD   Severe  FEV1 0.6 L   [facility]   ON Chronic prednisone    On O2    Shoud get CXR prior to D/C   6  Hx HTN   BP is OK    7  HL   ON statin  8   Disposition   Appreciate input of hospice/palliative care    I explained to wife, who said they are not ready, that  this will help pt with transition to home, help provide assistance that may keep him comfortable at home longer.     For questions or updates, please contact [place] Please consult [email or web address] for contact info under        Signed, Dorris Carnes, MD  06/07/2020, 9:25 AM

## 2020-06-07 NOTE — Progress Notes (Addendum)
PROGRESS NOTE    [name]  GEZ:[id number] DOB: [birth year] DOA: 06/05/2020 PCP: [name], MD    Brief Narrative:  [name] is an 87 year old male with past medical history significant for coronary artery disease s/p CABG 6503, chronic diastolic congestive heart failure, chronic hypoxic respiratory failure on 2 L nasal cannula, severe COPD on chronic prednisone therapy, PVD s/p right AKA, HTN, HLD, GERD, BPH, paroxysmal atrial fibrillation not on anticoagulation, chronic hyponatremia, anemia, chronic Foley, who presented to [name] ED on 5/28 with chest pain.  History obtained with assistance of his spouse.  Patient reports awakening with left-sided chest pressure radiating to the right portion of the chest and into bilateral arms.  Pain was constant lasting 15-20 minutes before resolving after administration of 2 sublingual nitroglycerin tablets.  Patient denied any radiation to his back or between his scapula.  He has remained chest pain-free since.  No known exacerbating factors, pain was nonpleuritic, nonpositional and not reproducible with palpation of anterior chest wall.  Further denies any shortness of breath more than his normal baseline, no fever/chills,, no palpitations, no diaphoresis, no nausea/vomiting, no dizziness, no presyncope or syncope.  No recent trauma/travel, no recent sick contacts.  In the ED, temperature 99.2 F, HR 93, RR 18, BP 90/51, SPO2 99% on 2 L nasal cannula.  Sodium 130, potassium 3.6, chloride 87, CO2 31, glucose 138, BUN 21, creatinine 1.32.  WBC 17.1, hemoglobin 9.2, platelets 272.  BNP 473.5.  High-sensitivity troponin [phone].  COVID-19 test negative.  Chest x-ray with no acute cardiopulmonary disease process.  EKG with normal sinus rhythm, rate 95, QTc 452, T wave inversion noted in V1, V2 which is similar in appearance to EKG dated 04/08/2020.  Patient was started on heparin drip.  Cardiology consulted.  TRH consulted for further evaluation  and management of NSTEMI.   Assessment & Plan:   Principal Problem:   NSTEMI (non-ST elevated myocardial infarction) ([place]) Active Problems:   Hyperlipidemia   COPD   GOLD IV/ 02/steroid dep    Chronic respiratory failure with hypoxia and hypercapnia (HCC)   Elevated troponin   Chronic diastolic CHF (congestive heart failure) (HCC)   Hypotension   Atrial fibrillation ([place])   NSTEMI Patient presenting to the ED following acute onset chest pain after awakening, described as a pressure-like sensation with radiation to bilateral arms.  Relieved with 2 doses of nitroglycerin.  High sensory troponin elevated on admission, [phone].  EKG with NSR, no significant change from previous EKG.  Chest x-ray unrevealing.  TTE with LVEF 60-65%, LV with no regional wall motion abnormalities, RV mildly enlarged, moderate TR, mild MR. Currently chest pain-free since admission and patient declines any aggressive invasive testing or intervention. --Cardiology following, appreciate assistance --Cardiology stopping therapeutic Lovenox today due to drop in hemoglobin --Aspirin 81 mg p.o. daily --Atorvastatin increased to 40 mg p.o. daily --Continue to monitor on telemetry  Hypotension Initial presentation, patient blood pressure noted to be 90/51, improved with IV fluid resuscitation.  Etiology likely secondary to hypovolemia in the setting of dehydration. --BP improved, 112/70 this morning, restarted furosemide and metoprolol today --Holding home tamsulosin --Continue NS at 75 mL/h --Continue monitor BP closely  Coronary artery disease Patient underwent CABG 2003 with LIMA to LAD, SVG to OM1-2, SVG to RCA and PDA.  --Atorvastatin increased to 40 mg p.o. daily --Aspirin 81 mg p.o. daily  Chronic hypoxic respiratory failure Gold stage IV COPD, steroid-dependent Prior extensive tobacco use, quit 40 years ago.  On 2 L nasal cannula at baseline.  No evidence of acute exacerbation at this  time. --Continue home Spiriva and Symbicort --Prednisone 10 mg p.o. daily --Xopenex neb as needed --Trilogy bipap qHS  Chronic diastolic congestive heart failure TTE with LVEF 60-65%, LV with no regional wall motion abnormalities, RV mildly enlarged, moderate TR, mild MR. On furosemide 40 mg p.o. daily at home.  Chest x-ray with no pulmonary edema.  Patient with hypotension, suspect dehydration as above. --Restarted home furosemide 40 mg p.o. daily today --Strict I's and O's and daily weights  Paroxysmal atrial fibrillation with RVR Not on chronic anticoagulation due to history of recurrent falls.  On metoprolol succinate at home.  EKG and telemetry notable for normal sinus rhythm. -- Restarting metoprolol tartrate 12.5 mg p.o. 3 times daily --Continue to monitor on telemetry  Chronic hypoosmolar hyponatremia Baseline sodium 128-132.  Sodium 130 on admission, at baseline. --BMP daily  Chronic normocytic anemia Baseline hemoglobin 7.5 to 10.  Presenting hemoglobin 9.2, within range but suspect hemoconcentration in the setting of dehydration. --Hgb 9.2>7.8>7.4 --iron panel pending --CBC daily  BPH --Holding home tamsulosin in the setting of hypotension as above --Continue finasteride --Continue with chronic Foley catheter, follows with urology, [name], reports last Foley exchange on 05/21/2020  Depression: Continue fluoxetine 10 mg p.o. daily  HLD: Atorvastatin increased to 40 mg p.o. daily  Stage III sacral decubitus ulcer, POA Left medial lower extremity wound, POA Pressure Injury 01/24/20 Coccyx Mid;Lower Stage 2 -  Partial thickness loss of dermis presenting as a shallow open injury with a red, pink wound bed without slough. (Active)  01/24/20 1445  Location: Coccyx  Location Orientation: Mid;Lower  Staging: Stage 2 -  Partial thickness loss of dermis presenting as a shallow open injury with a red, pink wound bed without slough.  Wound Description (Comments):    Present on Admission: Yes     Pressure Injury 02/06/20 Buttocks Left Stage 2 -  Partial thickness loss of dermis presenting as a shallow open injury with a red, pink wound bed without slough. Above previous wound (Active)  02/06/20 (noted by [facility] nurse on consult) 1459  Location: Buttocks  Location Orientation: Left  Staging: Stage 2 -  Partial thickness loss of dermis presenting as a shallow open injury with a red, pink wound bed without slough.  Wound Description (Comments): Above previous wound  Present on Admission: Yes     Pressure Injury 04/08/20 Buttocks Medial Stage 3 -  Full thickness tissue loss. Subcutaneous fat may be visible but bone, tendon or muscle are NOT exposed. (Active)  04/08/20 1500  Location: Buttocks  Location Orientation: Medial  Staging: Stage 3 -  Full thickness tissue loss. Subcutaneous fat may be visible but bone, tendon or muscle are NOT exposed.  Wound Description (Comments):   Present on Admission: Yes  --Patient followed by the wound care clinic outpatient.  Ethics: Patient with significant comorbidities to include end-stage COPD requiring chronic prednisone and oxygen at baseline, chronic hyponatremia, CAD, peripheral vascular disease s/p right AKA with left leg wound and sacral ulcer admitted for NSTEMI.  Patient declines any aggressive intervention or treatment.  Palliative care was consulted and recommended transition back to home hospice under the care of [facility] the [place].     DVT prophylaxis: none, Lovenox discontinued secondary to decrease in hemoglobin, SCDs contraindicated with left leg wound and patient has right AKA    Code Status: Partial Code Family Communication: Updated spouse present at bedside  Disposition  Plan:  Level of care: Progressive Status is: Inpatient  Remains inpatient appropriate because:Ongoing diagnostic testing needed not appropriate for outpatient work up, Unsafe d/c plan, IV treatments appropriate due to  intensity of illness or inability to take PO and Inpatient level of care appropriate due to severity of illness   Dispo: The patient is from: Home              Anticipated d/c is to: Home              Patient currently is not medically stable to d/c.   Difficult to place patient No   Consultants:   Cardiology  Palliative care  Procedures:   TTE  Antimicrobials:   None   Subjective: Patient seen and examined at bedside, resting comfortably.  On trilogy BiPAP this morning.   Patient states continues to be chest pain-free.  No significant shortness of breath more than his normal baseline.  No other specific concerns or complaints this morning.  Cardiology stopping Lovenox due to slight decline in hemoglobin.  Patient with accelerated heart rate and out atrial fibrillation this morning, cardiology restarting metoprolol today.  Denies headache, no dizziness, no chest pain, no palpitations, no shortness of breath, no fever/chills/night sweats, no nausea/vomiting, no abdominal pain.  No acute events overnight per nursing staff.  Objective: Vitals:   06/06/20 2230 06/07/20 0451 06/07/20 1042 06/07/20 1132  BP:  112/70 119/82 113/61  Pulse:  81 (!) 120 83  Resp:  18 12 14   Temp:  98.7 F (37.1 C)  (!) 97.5 F (36.4 C)  TempSrc:  Axillary  Oral  SpO2: 95% 100% 100% 100%  Weight:  58 kg      Intake/Output Summary (Last 24 hours) at 06/07/2020 1144 Last data filed at 06/07/2020 1133 Gross per 24 hour  Intake 2517.52 ml  Output 1850 ml  Net 667.52 ml   Filed Weights   06/06/20 1259 06/07/20 0451  Weight: 57.8 kg 58 kg    Examination:  General exam: Appears calm and comfortable, elderly and chronically ill in appearance Respiratory system: Clear to auscultation. Respiratory effort normal.  On 2 L nasal cannula which is his baseline Cardiovascular system: S1 & S2 heard, irregularly irregular rhythm, tachycardic. No JVD, murmurs, rubs, gallops or clicks.  Left foot with mild  edema to ankle. Gastrointestinal system: Abdomen is nondistended, soft and nontender. No organomegaly or masses felt. Normal bowel sounds heard. Central nervous system: Alert and oriented. No focal neurological deficits. Extremities: Right AKA noted, moves left lower extremity independently Skin: Multiple areas of ecchymosis to extremities, noted sacral ulcer and left medial wound Psychiatry: Judgement and insight appear normal. Mood & affect appropriate.       Data Reviewed: I have personally reviewed following labs and imaging studies  CBC: Recent Labs  Lab 06/05/20 1600 06/06/20 0052 06/07/20 0122  WBC 17.1* 14.2* 9.3  NEUTROABS  --  13.5*  --   HGB 9.2* 7.8* 7.4*  HCT 29.4* 24.4* 23.0*  MCV 97.7 96.1 95.0  PLT 272 209 998   Basic Metabolic Panel: Recent Labs  Lab 06/05/20 1600 06/06/20 0052 06/07/20 0122  NA 130* 129* 127*  K 3.6 3.6 3.3*  CL 87* 90* 88*  CO2 31 33* 33*  GLUCOSE 138* 125* 107*  BUN 21 22 25*  CREATININE 1.32* 1.17 1.12  CALCIUM 9.2 8.7* 8.6*  MG 2.0 2.0  --    GFR: Estimated Creatinine Clearance: 37.4 mL/min (by C-G formula based on SCr of  1.12 mg/dL). Liver Function Tests: Recent Labs  Lab 06/05/20 2000 06/06/20 0052  AST 29 25  ALT 22 18  ALKPHOS 69 59  BILITOT 0.5 0.8  PROT 5.4* 4.7*  ALBUMIN 3.3* 2.9*   No results for input(s): LIPASE, AMYLASE in the last 168 hours. No results for input(s): AMMONIA in the last 168 hours. Coagulation Profile: No results for input(s): INR, PROTIME in the last 168 hours. Cardiac Enzymes: No results for input(s): CKTOTAL, CKMB, CKMBINDEX, TROPONINI in the last 168 hours. BNP (last 3 results) No results for input(s): PROBNP in the last 8760 hours. HbA1C: No results for input(s): HGBA1C in the last 72 hours. CBG: No results for input(s): GLUCAP in the last 168 hours. Lipid Profile: No results for input(s): CHOL, HDL, LDLCALC, TRIG, CHOLHDL, LDLDIRECT in the last 72 hours. Thyroid Function  Tests: No results for input(s): TSH, T4TOTAL, FREET4, T3FREE, THYROIDAB in the last 72 hours. Anemia Panel: No results for input(s): VITAMINB12, FOLATE, FERRITIN, TIBC, IRON, RETICCTPCT in the last 72 hours. Sepsis Labs: Recent Labs  Lab 06/05/20 2000 06/05/20 2018  PROCALCITON 0.19  --   LATICACIDVEN  --  1.2    Recent Results (from the past 240 hour(s))  SARS CORONAVIRUS 2 (TAT 6-24 HRS) Nasopharyngeal Nasopharyngeal Swab     Status: None   Collection Time: 06/05/20  4:36 PM   Specimen: Nasopharyngeal Swab  Result Value Ref Range Status   SARS Coronavirus 2 NEGATIVE NEGATIVE Final    Comment: (NOTE) SARS-CoV-2 target nucleic acids are NOT DETECTED.  The SARS-CoV-2 RNA is generally detectable in upper and lower respiratory specimens during the acute phase of infection. Negative results do not preclude SARS-CoV-2 infection, do not rule out co-infections with other pathogens, and should not be used as the sole basis for treatment or other patient management decisions. Negative results must be combined with clinical observations, patient history, and epidemiological information. The expected result is Negative.  Fact Sheet for Patients: SugarRoll.be  Fact Sheet for Healthcare Providers: https://www.woods-mathews.com/  This test is not yet approved or cleared by the [place] FDA and  has been authorized for detection and/or diagnosis of SARS-CoV-2 by FDA under an Emergency Use Authorization (EUA). This EUA will remain  in effect (meaning this test can be used) for the duration of the COVID-19 declaration under Se ction 564(b)(1) of the Act, 21 U.S.C. section 360bbb-3(b)(1), unless the authorization is terminated or revoked sooner.  Performed at [facility], [place] [address] [place], [place] [address]   Culture, blood (Routine X 2) w Reflex to ID Panel     Status: None (Preliminary result)   Collection Time: 06/06/20  12:38 AM   Specimen: BLOOD RIGHT ARM  Result Value Ref Range Status   Specimen Description BLOOD RIGHT ARM  Final   Special Requests   Final    BOTTLES DRAWN AEROBIC ONLY Blood Culture adequate volume   Culture   Final    NO GROWTH 1 DAY Performed at [facility], [place] [address], [place], [place] [address]    Report Status PENDING  Incomplete  Culture, blood (Routine X 2) w Reflex to ID Panel     Status: None (Preliminary result)   Collection Time: 06/06/20 12:53 AM   Specimen: BLOOD LEFT HAND  Result Value Ref Range Status   Specimen Description BLOOD LEFT HAND  Final   Special Requests   Final    BOTTLES DRAWN AEROBIC AND ANAEROBIC Blood Culture adequate volume   Culture  Final    NO GROWTH 1 DAY Performed at [facility], [place] [address], [place], [place] [address]    Report Status PENDING  Incomplete         Radiology Studies: DG Chest 2 View  Result Date: 06/05/2020 CLINICAL DATA:  Chest pain. EXAM: CHEST - 2 VIEW COMPARISON:  02/08/2020 and older studies. FINDINGS: Stable changes from prior CABG surgery. Cardiac silhouette is normal in size and configuration. No mediastinal or hilar masses. No adenopathy. Lungs are hyperexpanded. Mild interstitial prominence most evident at the bases. Lungs otherwise clear. No pleural effusion or pneumothorax. Skeletal structures are demineralized, but intact. IMPRESSION: 1. No acute cardiopulmonary disease. 2. COPD. Electronically Signed   By: Lajean Manes M.D.   On: 06/05/2020 17:29   ECHOCARDIOGRAM COMPLETE  Result Date: 06/06/2020    ECHOCARDIOGRAM REPORT   Patient Name:   JORDIE SCHREUR [facility] Date of Exam: 06/06/2020 Medical Rec #:  [id number]    Height:       69.0 in Accession #:    [id number]   Weight:       121.9 lb Date of Birth:  1929-01-29    BSA:          1.674 m Patient Age:    4 years     BP:           100/65 mmHg Patient Gender: M            HR:           69 bpm. Exam Location:  Inpatient Procedure: 2D Echo, Cardiac  Doppler and Color Doppler Indications:    R07.9* Chest pain, unspecified  History:        Patient has prior history of Echocardiogram examinations, most                 recent 01/28/2020. CHF, Previous Myocardial Infarction and CAD,                 Abnormal ECG, COPD, Arrythmias:Atrial Fibrillation and Atrial                 Flutter, Signs/Symptoms:Shortness of Breath and Dyspnea; Risk                 Factors:Hypertension and Dyslipidemia. Cor pulmonale.  Sonographer:    Roseanna Rainbow RDCS Referring Phys: [id number] Rhetta Mura  Sonographer Comments: Technically difficult study due to poor echo windows, suboptimal parasternal window and suboptimal apical window. Extremely difficult windows. IMPRESSIONS  1. Left ventricular ejection fraction, by estimation, is 60 to 65%. The left ventricle has normal function. The left ventricle has no regional wall motion abnormalities. Left ventricular diastolic parameters were normal.  2. Right ventricular systolic function is low normal. The right ventricular size is mildly enlarged. There is moderately elevated pulmonary artery systolic pressure.  3. The mitral valve is degenerative. Mild mitral valve regurgitation. Moderate mitral annular calcification.  4. Tricuspid valve regurgitation is moderate.  5. The aortic valve is grossly normal. There is mild calcification of the aortic valve. There is mild thickening of the aortic valve. Aortic valve regurgitation is not visualized. Mild aortic valve sclerosis is present, with no evidence of aortic valve stenosis. Comparison(s): No significant change from prior study. FINDINGS  Left Ventricle: Left ventricular ejection fraction, by estimation, is 60 to 65%. The left ventricle has normal function. The left ventricle has no regional wall motion abnormalities. The left ventricular internal cavity size was normal in size. There is  no  left ventricular hypertrophy. Abnormal (paradoxical) septal motion consistent with post-operative status.  Left ventricular diastolic parameters were normal. Right Ventricle: The right ventricular size is mildly enlarged. Right vetricular wall thickness was not well visualized. Right ventricular systolic function is low normal. There is moderately elevated pulmonary artery systolic pressure. The tricuspid regurgitant velocity is 3.01 m/s, and with an assumed right atrial pressure of 15 mmHg, the estimated right ventricular systolic pressure is 62.7 mmHg. Left Atrium: Left atrial size was normal in size. Right Atrium: Right atrial size was normal in size. Pericardium: Trivial pericardial effusion is present. Mitral Valve: The mitral valve is degenerative in appearance. There is mild thickening of the mitral valve leaflet(s). There is mild calcification of the mitral valve leaflet(s). Moderate mitral annular calcification. Mild mitral valve regurgitation. Tricuspid Valve: The tricuspid valve is normal in structure. Tricuspid valve regurgitation is moderate. Aortic Valve: The aortic valve is grossly normal. There is mild calcification of the aortic valve. There is mild thickening of the aortic valve. Aortic valve regurgitation is not visualized. Mild aortic valve sclerosis is present, with no evidence of aortic valve stenosis. Pulmonic Valve: The pulmonic valve was not well visualized. Pulmonic valve regurgitation is not visualized. Aorta: The aortic root, ascending aorta and aortic arch are all structurally normal, with no evidence of dilitation or obstruction. IAS/Shunts: The atrial septum is grossly normal.  LEFT VENTRICLE PLAX 2D LVIDd:         3.60 cm     Diastology LVIDs:         2.50 cm     LV e' medial:    7.65 cm/s LV PW:         1.10 cm     LV E/e' medial:  13.5 LV IVS:        1.10 cm     LV e' lateral:   8.93 cm/s LVOT diam:     2.30 cm     LV E/e' lateral: 11.5 LV SV:         57 LV SV Index:   34 LVOT Area:     4.15 cm  LV Volumes (MOD) LV vol d, MOD A2C: 36.7 ml LV vol d, MOD A4C: 64.4 ml LV vol s, MOD A2C:  18.9 ml LV vol s, MOD A4C: 24.3 ml LV SV MOD A2C:     17.8 ml LV SV MOD A4C:     64.4 ml LV SV MOD BP:      28.4 ml RIGHT VENTRICLE RV S prime:     7.73 cm/s TAPSE (M-mode): 1.4 cm LEFT ATRIUM           Index       RIGHT ATRIUM           Index LA diam:      3.40 cm 2.03 cm/m  RA Area:     13.10 cm LA Vol (A2C): 19.1 ml 11.41 ml/m RA Volume:   33.90 ml  20.25 ml/m LA Vol (A4C): 19.9 ml 11.89 ml/m  AORTIC VALVE LVOT Vmax:   70.30 cm/s LVOT Vmean:  44.100 cm/s LVOT VTI:    0.138 m  AORTA Ao Root diam: 3.70 cm Ao Asc diam:  3.50 cm MITRAL VALVE                TRICUSPID VALVE MV Area (PHT): 5.27 cm     TR Peak grad:   36.2 mmHg MV Decel Time: 144 msec     TR Vmax:  301.00 cm/s MV E velocity: 103.00 cm/s MV A velocity: 64.00 cm/s   SHUNTS MV E/A ratio:  1.61         Systemic VTI:  0.14 m                             Systemic Diam: 2.30 cm Buford Dresser MD Electronically signed by Buford Dresser MD Signature Date/Time: 06/06/2020/10:57:09 AM    Final         Scheduled Meds: . acetaminophen  500 mg Oral TID  . aspirin EC  81 mg Oral Daily  . atorvastatin  40 mg Oral QHS  . budesonide-formoterol  2 puff Inhalation BID  . finasteride  5 mg Oral Q lunch  . FLUoxetine  10 mg Oral QHS  . fluticasone  1-2 spray Each Nare Daily  . furosemide  40 mg Oral Daily  . guaiFENesin  600 mg Oral BID  . metoprolol tartrate  12.5 mg Oral BID  . polyvinyl alcohol  1 drop Both Eyes TID  . predniSONE  10 mg Oral Daily  . sodium chloride  1 drop Both Eyes QID  . Tiotropium Bromide Monohydrate  2 puff Inhalation Q0600  . vitamin B-12  1,000 mcg Oral Daily   Continuous Infusions:    LOS: 2 days    Time spent: 42 minutes spent on chart review, discussion with nursing staff, consultants, updating family and interview/physical exam; more than 50% of that time was spent in counseling and/or coordination of care.    Delaney Schnick J [place] DO [facility] Available via Epic secure chat  7am-7pm After these hours, please refer to coverage provider listed on [email or web address] 06/07/2020, 11:44 AM

## 2020-06-07 NOTE — Progress Notes (Signed)
Patient will self-administer CPAP using his equipment from home and with assistance from his wife.  Patient is familiar with equipment and procedure.

## 2020-06-07 NOTE — Progress Notes (Signed)
Patient still on home BIPAP, asleep. Will attempt to do patient home med with them when they awake.

## 2020-06-07 NOTE — Consult Note (Signed)
[facility] Nurse Consult Note: Patient receiving care in [place]. Reason for Consult: sacral and LLE wounds Wound type: LLE is an old, non-healing skin tear. Coccyx is an PI. Both wounds are being followed by the [facility]'s Dr. Dellia Nims. Pressure Injury POA: Yes Measurement: LLE skin tear area measures 4 cm x 1 cm x 1.5 cm, has a pink wound bed and a "flap" of tissue at the superior margin of the wound. The coccyx wound is white and macerated. The macerated area measure 1 cm x 0.2 cm and the "opening" is literally a pin point size. Wound bed: Drainage (amount, consistency, odor) none from either Periwound: intact Dressing procedure/placement/frequency: Cleanse the wound on the LLE and the coccyx with soap and water, pat dry. For the LLE, PLACE A PIECE OF Aquacel Kellie Simmering [phone]) INTO the wound, then cover with kerlex. Do NOT wrap an ace wrap around the leg. For the Coccyx, place a small piece of Aquacel over the coccyx wound, then a foam dressing. Perform daily. I have also entered an order for staff to turn the patient.  Monitor the wound area(s) for worsening of condition such as: Signs/symptoms of infection,  Increase in size,  Development of or worsening of odor, Development of pain, or increased pain at the affected locations.  Notify the medical team if any of these develop.  Thank you for the consult.  Discussed plan of care with the patient and bedside nurse.  [place] nurse will not follow at this time.  Please re-consult the [place] team if needed.  Val Riles, RN, MSN, CWOCN, CNS-BC, pager [phone]

## 2020-06-07 NOTE — Progress Notes (Signed)
   06/07/20 1042  Assess: MEWS Score  BP 119/82  Pulse Rate (!) 120  ECG Heart Rate (!) 114  Resp 12  Level of Consciousness Alert  SpO2 100 %  Assess: MEWS Score  MEWS Temp 0  MEWS Systolic 0  MEWS Pulse 2  MEWS RR 1  MEWS LOC 0  MEWS Score 3  MEWS Score Color Yellow  Assess: if the MEWS score is Yellow or Red  Were vital signs taken at a resting state? Yes  Focused Assessment No change from prior assessment  Early Detection of Sepsis Score *See Row Information* Low  Treat  Pain Scale 0-10  Pain Score 0  Neuro symptoms relieved by Anti-anxiety medication  Notify: Charge Nurse/RN  Name of Charge Nurse/RN Notified Linda, RN  Date Charge Nurse/RN Notified 06/07/20  Time Charge Nurse/RN Notified 1100  Notify: Provider  Provider Name/Title [place]  Date Provider Notified 06/07/20  Time Provider Notified 1045  Notification Type  (Secure chat)  Notification Reason  (Elevated HR)  Provider response See new orders  Date of Provider Response 06/07/20  Time of Provider Response 1046  Document  Patient Outcome Other (Comment) (Monitoring)  Progress note created (see row info) Yes

## 2020-06-07 NOTE — Progress Notes (Signed)
    Hospice meeting at bedside with pt's wife [name] and pt himself. Introduced them to hospice services at home with interdisciplinary team approach. The pt's wife has many concerns about the pt going home with her. If he can not stand and transfer himself she feels that going home is not an option. She does not feel that she can care for him in the home care setting if he is bed bound.   She would like for him to go to a SNF if this is the case. I have instructed her of services that hospice can provide in a SNF as well if they are not going to be a candidate or want to waive there therapy days. I have notified the CM as well of this concern so that they can help with d/c plan.   Pt wife is in agreement to hospice services and pt is as well once d/c.   He has hospital bed, oxygen, triology machine, walker, wheelchair in home. He also has chair lifts to assist with stairs to go from from different levels in the home. This is with adapt. Webb Silversmith RN [phone]

## 2020-06-08 ENCOUNTER — Encounter (HOSPITAL_BASED_OUTPATIENT_CLINIC_OR_DEPARTMENT_OTHER): Payer: Medicare Other | Admitting: Internal Medicine

## 2020-06-08 DIAGNOSIS — I480 Paroxysmal atrial fibrillation: Secondary | ICD-10-CM | POA: Diagnosis not present

## 2020-06-08 DIAGNOSIS — I5032 Chronic diastolic (congestive) heart failure: Secondary | ICD-10-CM

## 2020-06-08 DIAGNOSIS — I214 Non-ST elevation (NSTEMI) myocardial infarction: Secondary | ICD-10-CM | POA: Diagnosis not present

## 2020-06-08 DIAGNOSIS — I219 Acute myocardial infarction, unspecified: Secondary | ICD-10-CM

## 2020-06-08 DIAGNOSIS — Z7189 Other specified counseling: Secondary | ICD-10-CM | POA: Diagnosis not present

## 2020-06-08 DIAGNOSIS — J9611 Chronic respiratory failure with hypoxia: Secondary | ICD-10-CM | POA: Diagnosis not present

## 2020-06-08 DIAGNOSIS — Z515 Encounter for palliative care: Secondary | ICD-10-CM | POA: Diagnosis not present

## 2020-06-08 LAB — CBC
HCT: 22.9 % — ABNORMAL LOW (ref 39.0–52.0)
Hemoglobin: 7.3 g/dL — ABNORMAL LOW (ref 13.0–17.0)
MCH: 30.4 pg (ref 26.0–34.0)
MCHC: 31.9 g/dL (ref 30.0–36.0)
MCV: 95.4 fL (ref 80.0–100.0)
Platelets: 215 10*3/uL (ref 150–400)
RBC: 2.4 MIL/uL — ABNORMAL LOW (ref 4.22–5.81)
RDW: 14.7 % (ref 11.5–15.5)
WBC: 5.9 10*3/uL (ref 4.0–10.5)
nRBC: 0 % (ref 0.0–0.2)

## 2020-06-08 LAB — BASIC METABOLIC PANEL
Anion gap: 7 (ref 5–15)
BUN: 21 mg/dL (ref 8–23)
CO2: 31 mmol/L (ref 22–32)
Calcium: 8.7 mg/dL — ABNORMAL LOW (ref 8.9–10.3)
Chloride: 89 mmol/L — ABNORMAL LOW (ref 98–111)
Creatinine, Ser: 1.01 mg/dL (ref 0.61–1.24)
GFR, Estimated: >60 mL/min (ref >60)
Glucose, Bld: 92 mg/dL (ref 70–99)
Potassium: 4.2 mmol/L (ref 3.5–5.1)
Sodium: 127 mmol/L — ABNORMAL LOW (ref 135–145)

## 2020-06-08 MED ORDER — CALCIUM CARBONATE ANTACID 500 MG PO CHEW
1.0000 | CHEWABLE_TABLET | Freq: Three times a day (TID) | ORAL | Status: DC
  Administered 2020-06-08: 200 mg via ORAL
  Filled 2020-06-08: qty 1

## 2020-06-08 MED ORDER — CHLORHEXIDINE GLUCONATE CLOTH 2 % EX PADS
6.0000 | MEDICATED_PAD | Freq: Every day | CUTANEOUS | Status: DC
  Administered 2020-06-09 – 2020-06-10 (×2): 6 via TOPICAL

## 2020-06-08 MED ORDER — CALCIUM CARBONATE ANTACID 500 MG PO CHEW
2.0000 | CHEWABLE_TABLET | Freq: Three times a day (TID) | ORAL | Status: DC
  Administered 2020-06-08 – 2020-06-10 (×5): 400 mg via ORAL
  Filled 2020-06-08 (×5): qty 2

## 2020-06-08 NOTE — Progress Notes (Signed)
Palliative Medicine Inpatient Follow Up Note  Reason for consult:  Goals of Care "11M w/ end stage COPD on home O2 and prednsione, dCHF, PVD s/p AKA w/ sacral and left leg wound admit w/ NSTEMI. Hospice candidate. Please assist w/ GOC and MDM."  HPI:  Per intake H&P --> [name] an 87 year old malewith past medical history significant for coronary artery disease s/p CABG 7829, chronic diastolic congestive heart failure, chronic hypoxic respiratory failure on 2 L nasal cannula, severe COPD on chronic prednisone therapy, PVD s/p right AKA, HTN, HLD, GERD, BPH, paroxysmal atrial fibrillation not on anticoagulation, chronic hyponatremia, anemia, chronic Foley, who presented to [name] ED on 5/28 with chest pain. Identified to have an NSTEMI - cardiology involved. Plan to medically optimize on medications.  Palliative care has been asked to get involved setting of multiple comorbid conditions to further discuss goals of care.  Today's Discussion (06/08/2020):  *Please note that this is a verbal dictation therefore any spelling or grammatical errors are due to the "[name]" system interpretation.  Chart reviewed.  I met with [name] and his spouse, [name] at bedside this afternoon.  We reviewed that [name] has had an absence of chest pain as compared to 2 days prior.  He has also been less anxious.  Discussed that he had been started on Tylenol 500 mg around-the-clock which does seem to be aiding in pain relief.  [name] shares he is having some abdominal discomfort this information has been relayed to Dr. Karleen Hampshire.  Tums 3 times daily has been initiated.  We spoke at length about outpatient palliative care versus hospice care.  I shared with Jarl and his wife that the goal was to get him enrolled in hospice of the [facility] outpatient palliative care program after he went home from skilled nursing so that he has comprehensive symptom management.  [name] shares that he now  understands the difference more comprehensively.  Reviewed the plan for skilled nursing facility placement for a few weeks to optimize physical strength.  Discussed the differences between physical therapy and skilled nursing versus physical therapy in the home.  Reviewed code status again [name] confirms no chest compression/intubation/no transfer to ICU; Trial of shocks and ACLS medications.  Questions and concerns addressed   Objective Assessment: Vital Signs Vitals:   06/08/20 0831 06/08/20 1141  BP:  (!) 122/59  Pulse:  68  Resp:  15  Temp:  97.8 F (36.6 C)  SpO2: 97% 100%    Intake/Output Summary (Last 24 hours) at 06/08/2020 1314 Last data filed at 06/08/2020 1114 Gross per 24 hour  Intake 360 ml  Output 800 ml  Net -440 ml   Last Weight  Most recent update: 06/08/2020  3:40 AM   Weight  61.2 kg (134 lb 14.7 oz)            Gen: Frail elderly Caucasian man in no acute distress HEENT: moist mucous membranes CV: Regular rate and irregular rhythm PULM: On 2 L/min nasal cannula ABD: Distended, tender RUQ EXT: R AKA, LLE (+)edema Neuro: Alert and oriented x3 - hardof hearing  SUMMARY OF RECOMMENDATIONS Partial Code - No chest compression/intubation/no transfer to ICU; Trial of shocks and ACLS medications  Will attempt to complete a Goals of Care note in Vynca to reflect code status  Chronic arthritic pain: Tylenol 500 mg 3 times daily; oxycodone 2.5 mg every 6 hours as needed  Abdominal Pain: LBM yesterday. Tums TID - if worsens consider [place]UB vs.  [place]  Generalized anxiety: Continue Prozac; patient prior to receiving Xanax 0.5 mg 3 times daily PRN. May consider using BuSpar 5 mg twice daily to see if this has an effect on patient's anxiety as it is identified as a safer geriatric drug than comparable benzodiazepines  Appreciate transitions of care team aiding in referral to [facility] the [place] for outpatient palliative care services - Have spoken to them about  enrollment into their Palliative Care program which they have agreed to  Ongoing incremental palliative care support  Time Spent: 60 Greater than 50% of the time was spent in counseling and coordination of care ______________________________________________________________________________________ [name] Team Team Cell Phone: [phone] Please utilize secure chat with additional questions, if there is no response within 30 minutes please call the above phone number  Palliative Medicine Team providers are available by phone from 7am to 7pm daily and can be reached through the team cell phone.  Should this patient require assistance outside of these hours, please call the patient's attending physician.

## 2020-06-08 NOTE — Progress Notes (Signed)
[facility] 6E16 [facility] Hospital Liaison note:  This patient is currently enrolled in [facility] outpatient-based Palliative Care. Will continue to follow for disposition.  Please call with any outpatient palliative questions or concerns.  Thank you, Dee Curry, LPN [facility] Hospital Liaison [phone] 

## 2020-06-08 NOTE — Progress Notes (Signed)
Pt has allergy to CHG wipes.  Pt and his wife asked the indication of CHG, his wife wanted him to receive CHG bath.  Idolina Primer, RN

## 2020-06-08 NOTE — Progress Notes (Signed)
Physical Therapy Treatment Patient Details Name: [name] MRN: [id number] DOB: 1929-09-21 Today's Date: 06/08/2020    History of Present Illness Pt is an 87 y.o. male admitted 06/05/20 with chest pain; workup for NSTEMI. Pt also with chronic LLE and coccyx wounds. PMH includes CAD (s/p CABG 2003), HF, severe COPD on chronic prednisone therapy, chronic hypoxic respiratory failure (2L O2 baseline), PVD s/p R AKA, HTN, PAF, anemia, chronic foley, depression.   PT Comments    Pt seen for additional session with wife [name]) present and supportive. Pt requires increased time and effort for all mobility, limited by fatigue, generalized weakness, and decreased activity tolerance. Once pt more awake/alert, following simple commands appropriately with improved awareness of situation; pt's wife reports near baseline cognition "when he's actually awake." Pt currently requiring assist for transfer set-up and min guard for lateral scooting. Wife reports that at baseline, pt performing pivot transfers and set-up at supervision-level . Pt would benefit from SNF-level therapies to maximize functional mobility and decrease caregiver burden prior to return home; pt in agreement. Will continue to follow acutely.  HR 69-70s SpO2 >/96% on 2L O2 [place]   Follow Up Recommendations  SNF;Supervision for mobility/OOB     Equipment Recommendations  None recommended by PT    Recommendations for Other Services       Precautions / Restrictions Precautions Precautions: Fall;Other (comment) Precaution Comments: Fragile skin with chronic LLE/sacral wounds; h/o R AKA; baseline 2L O2; chronic foley Required Braces or Orthoses: Other Brace Other Brace: L post-op shoe (prefers to wear with transfers)    Mobility  Bed Mobility Overal bed mobility: Needs Assistance Bed Mobility: Supine to Sit     Supine to sit: Supervision     General bed mobility comments: Mod indep to come to long sitting from slightly elevated  HOB, increased time to scoot to EOB requiring verbal cues    Transfers Overall transfer level: Needs assistance Equipment used: None Transfers: Lateral/Scoot Transfers     Squat pivot transfers: Min assist;Min guard     General transfer comment: Required assist for transfer set-up for lateral scoot transfer from bed to drop-arm recliner, assist to don L post-op shoe; pt able to scoot towards L-side with min guard for balance, increased time and effort, quick to fatigue  Ambulation/Gait                 Stairs             Wheelchair Mobility    Modified Rankin (Stroke Patients Only)       Balance Overall balance assessment: Needs assistance Sitting-balance support: No upper extremity supported;Feet unsupported Sitting balance-Leahy Scale: Fair Sitting balance - Comments: Seated unsupported EOB with forward lean                                    Cognition Arousal/Alertness: Awake/alert Behavior During Therapy: Flat affect Overall Cognitive Status: Difficult to assess Area of Impairment: Attention;Awareness;Problem solving;Following commands                   Current Attention Level: Selective   Following Commands: Follows one step commands consistently;Follows one step commands with increased time   Awareness: Emergent Problem Solving: Slow processing;Requires verbal cues General Comments: Fatigued requiring increased time to maintain arousal; more appropriate once awake, although limited by [facility]. Pt demonstrates good awareness of transfer set-up and rojo cushion placement, as well as lines/leads  being organized or not. Wife reports pt near baseline cognition "but he has to be awake"      Exercises      General Comments General comments (skin integrity, edema, etc.): [name] [name]) present and supportive. Discussed recommendation for SNF-level therapies, wife in agreement for short-term rehab, as she is not able to physically  help pt with transfers, baseline he is supervision-level for multiple scooting/pivot transfers      Pertinent Vitals/Pain Pain Assessment: Faces Faces Pain Scale: Hurts a little bit Pain Location: Neck, shoulders, stomach Pain Descriptors / Indicators: Aching;Discomfort Pain Intervention(s): Monitored during session;Repositioned    Home Living Family/patient expects to be discharged to:: Private residence Living Arrangements: Spouse/significant other Available Help at Discharge: Family;Available PRN/intermittently Type of Home: House Home Access: Level entry   Home Layout: Multi-level;Bed/bath upstairs Home Equipment: Walker - 2 wheels;Cane - single point;Shower seat;Transport chair;Tub bench;Other (comment) (stair lift, drop-arm BSC) Additional Comments: Wife is elderly and unable to provide physical assist. When asked about other assist available, pt reports, "She (spouse) refuses to let anyone else into the house to help" - sounds like a daughter is available PRN    Prior Function Level of Independence: Needs assistance  Gait / Transfers Assistance Needed: Pt transfers at wheelchair-level, intermittent assist from wife. Primarily sedentary ADL's / Homemaking Assistance Needed: Assist from wife for all ADL tasks Comments: Wears 2L O2 baseline   PT Goals (current goals can now be found in the care plan section) Acute Rehab PT Goals Patient Stated Goal: Short-term rehab at SNF to regain strength before return home PT Goal Formulation: With patient/family Time For Goal Achievement: 06/24/20 Potential to Achieve Goals: Fair Progress towards PT goals: Progressing toward goals    Frequency    Min 2X/week      PT Plan Current plan remains appropriate;Frequency needs to be updated    Co-evaluation              AM-PAC PT "6 Clicks" Mobility   Outcome Measure  Help needed turning from your back to your side while in a flat bed without using bedrails?: None Help needed  moving from lying on your back to sitting on the side of a flat bed without using bedrails?: A Little Help needed moving to and from a bed to a chair (including a wheelchair)?: A Little Help needed standing up from a chair using your arms (e.g., wheelchair or bedside chair)?: A Lot Help needed to walk in hospital room?: Total Help needed climbing 3-5 steps with a railing? : Total 6 Click Score: 14    End of Session Equipment Utilized During Treatment: Oxygen;Gait belt Activity Tolerance: Patient tolerated treatment well Patient left: in chair;with call bell/phone within reach;with chair alarm set;with family/visitor present Nurse Communication: Mobility status PT Visit Diagnosis: Other abnormalities of gait and mobility (R26.89);Muscle weakness (generalized) (M62.81)     Time: 3532-9924 PT Time Calculation (min) (ACUTE ONLY): 14 min  Charges:  $Therapeutic Activity: 8-22 mins                     Mabeline Caras, PT, DPT Acute Rehabilitation Services  Pager [phone] Office [place] 06/08/2020, 12:49 PM

## 2020-06-08 NOTE — Progress Notes (Signed)
   Follow up with family pt's wife [name] and states they have decided that hospice services are not what they feel like is in the pt's best interest at this time.  They will be interested in going home with [facility] services.   Will close out referral Please follow up with [place] if we can assist going forward.   Webb Silversmith RN [phone]

## 2020-06-08 NOTE — Progress Notes (Addendum)
Progress Note  Patient Name: [name] Date of Encounter: 06/08/2020  [facility] Cardiologist: Sinclair Grooms, MD   Subjective   No chest pain  Inpatient Medications    Scheduled Meds: . acetaminophen  500 mg Oral TID  . aspirin EC  81 mg Oral Daily  . atorvastatin  40 mg Oral QHS  . budesonide-formoterol  2 puff Inhalation BID  . famotidine  20 mg Oral Daily  . finasteride  5 mg Oral Q lunch  . FLUoxetine  10 mg Oral QHS  . fluticasone  1-2 spray Each Nare Daily  . furosemide  40 mg Oral Daily  . guaiFENesin  600 mg Oral BID  . metoprolol tartrate  12.5 mg Oral TID  . pantoprazole  40 mg Oral Daily  . polyvinyl alcohol  1 drop Both Eyes TID  . predniSONE  10 mg Oral Daily  . sodium chloride  1 drop Both Eyes QID  . Tiotropium Bromide Monohydrate  2 puff Inhalation Q0600  . vitamin B-12  1,000 mcg Oral Daily   Continuous Infusions:   PRN Meds: acetaminophen **OR** acetaminophen, albuterol, ALPRAZolam, nitroGLYCERIN, oxyCODONE   Vital Signs    Vitals:   06/08/20 0340 06/08/20 0412 06/08/20 0737 06/08/20 0831  BP: (!) 92/58 101/67 127/83   Pulse: 88  71   Resp: 18  13   Temp: 98 F (36.7 C)  97.6 F (36.4 C)   TempSrc:   Axillary   SpO2:   100% 97%  Weight:        Intake/Output Summary (Last 24 hours) at 06/08/2020 0923 Last data filed at 06/08/2020 0340 Gross per 24 hour  Intake 1117.79 ml  Output 1300 ml  Net -182.21 ml   Last 3 Weights 06/08/2020 06/07/2020 06/06/2020  Weight (lbs) 134 lb 14.7 oz 127 lb 13.9 oz 127 lb 6.4 oz  Weight (kg) 61.2 kg 58 kg 57.788 kg      Telemetry    Atrial flutter yesterday for awhile now SR  - Personally Reviewed Some SB ECG    No new - Personally Reviewed  Physical Exam  EXAM per Dr. Sallyanne Kuster  Labs    High Sensitivity Troponin:   Recent Labs  Lab 06/05/20 1600 06/05/20 1804 06/06/20 0052 06/06/20 0444  TROPONINIHS 287* 426* 547* 397*      Chemistry Recent Labs  Lab 06/05/20 2000  06/06/20 0052 06/07/20 0122 06/08/20 0257  NA  --  129* 127* 127*  K  --  3.6 3.3* 4.2  CL  --  90* 88* 89*  CO2  --  33* 33* 31  GLUCOSE  --  125* 107* 92  BUN  --  22 25* 21  CREATININE  --  1.17 1.12 1.01  CALCIUM  --  8.7* 8.6* 8.7*  PROT 5.4* 4.7*  --   --   ALBUMIN 3.3* 2.9*  --   --   AST 29 25  --   --   ALT 22 18  --   --   ALKPHOS 69 59  --   --   BILITOT 0.5 0.8  --   --   GFRNONAA  --  60* >60 >60  ANIONGAP  --  6 6 7      Hematology Recent Labs  Lab 06/06/20 0052 06/07/20 0122 06/07/20 1326 06/08/20 0257  WBC 14.2* 9.3  --  5.9  RBC 2.54* 2.42* 2.63* 2.40*  HGB 7.8* 7.4*  --  7.3*  HCT 24.4* 23.0*  --  22.9*  MCV 96.1 95.0  --  95.4  MCH 30.7 30.6  --  30.4  MCHC 32.0 32.2  --  31.9  RDW 15.1 14.8  --  14.7  PLT 209 213  --  215    BNP Recent Labs  Lab 06/05/20 1600 06/07/20 1326  BNP 473.5* 407.0*     DDimer No results for input(s): DDIMER in the last 168 hours.   Radiology    ECHOCARDIOGRAM COMPLETE  Result Date: 06/06/2020    ECHOCARDIOGRAM REPORT   Patient Name:   [name] [facility] Date of Exam: 06/06/2020 Medical Rec #:  [id number]    Height:       69.0 in Accession #:    [id number]   Weight:       121.9 lb Date of Birth:  Jun 19, 1929    BSA:          1.674 m Patient Age:    87 years     BP:           100/65 mmHg Patient Gender: M            HR:           69 bpm. Exam Location:  Inpatient Procedure: 2D Echo, Cardiac Doppler and Color Doppler Indications:    R07.9* Chest pain, unspecified  History:        Patient has prior history of Echocardiogram examinations, most                 recent 01/28/2020. CHF, Previous Myocardial Infarction and CAD,                 Abnormal ECG, COPD, Arrythmias:Atrial Fibrillation and Atrial                 Flutter, Signs/Symptoms:Shortness of Breath and Dyspnea; Risk                 Factors:Hypertension and Dyslipidemia. Cor pulmonale.  Sonographer:    Roseanna Rainbow RDCS Referring Phys: [id number] Rhetta Mura  Sonographer  Comments: Technically difficult study due to poor echo windows, suboptimal parasternal window and suboptimal apical window. Extremely difficult windows. IMPRESSIONS  1. Left ventricular ejection fraction, by estimation, is 60 to 65%. The left ventricle has normal function. The left ventricle has no regional wall motion abnormalities. Left ventricular diastolic parameters were normal.  2. Right ventricular systolic function is low normal. The right ventricular size is mildly enlarged. There is moderately elevated pulmonary artery systolic pressure.  3. The mitral valve is degenerative. Mild mitral valve regurgitation. Moderate mitral annular calcification.  4. Tricuspid valve regurgitation is moderate.  5. The aortic valve is grossly normal. There is mild calcification of the aortic valve. There is mild thickening of the aortic valve. Aortic valve regurgitation is not visualized. Mild aortic valve sclerosis is present, with no evidence of aortic valve stenosis. Comparison(s): No significant change from prior study. FINDINGS  Left Ventricle: Left ventricular ejection fraction, by estimation, is 60 to 65%. The left ventricle has normal function. The left ventricle has no regional wall motion abnormalities. The left ventricular internal cavity size was normal in size. There is  no left ventricular hypertrophy. Abnormal (paradoxical) septal motion consistent with post-operative status. Left ventricular diastolic parameters were normal. Right Ventricle: The right ventricular size is mildly enlarged. Right vetricular wall thickness was not well visualized. Right ventricular systolic function is low normal. There is moderately elevated pulmonary artery systolic pressure. The tricuspid regurgitant velocity is 3.01 m/s,  and with an assumed right atrial pressure of 15 mmHg, the estimated right ventricular systolic pressure is 30.8 mmHg. Left Atrium: Left atrial size was normal in size. Right Atrium: Right atrial size was  normal in size. Pericardium: Trivial pericardial effusion is present. Mitral Valve: The mitral valve is degenerative in appearance. There is mild thickening of the mitral valve leaflet(s). There is mild calcification of the mitral valve leaflet(s). Moderate mitral annular calcification. Mild mitral valve regurgitation. Tricuspid Valve: The tricuspid valve is normal in structure. Tricuspid valve regurgitation is moderate. Aortic Valve: The aortic valve is grossly normal. There is mild calcification of the aortic valve. There is mild thickening of the aortic valve. Aortic valve regurgitation is not visualized. Mild aortic valve sclerosis is present, with no evidence of aortic valve stenosis. Pulmonic Valve: The pulmonic valve was not well visualized. Pulmonic valve regurgitation is not visualized. Aorta: The aortic root, ascending aorta and aortic arch are all structurally normal, with no evidence of dilitation or obstruction. IAS/Shunts: The atrial septum is grossly normal.  LEFT VENTRICLE PLAX 2D LVIDd:         3.60 cm     Diastology LVIDs:         2.50 cm     LV e' medial:    7.65 cm/s LV PW:         1.10 cm     LV E/e' medial:  13.5 LV IVS:        1.10 cm     LV e' lateral:   8.93 cm/s LVOT diam:     2.30 cm     LV E/e' lateral: 11.5 LV SV:         57 LV SV Index:   34 LVOT Area:     4.15 cm  LV Volumes (MOD) LV vol d, MOD A2C: 36.7 ml LV vol d, MOD A4C: 64.4 ml LV vol s, MOD A2C: 18.9 ml LV vol s, MOD A4C: 24.3 ml LV SV MOD A2C:     17.8 ml LV SV MOD A4C:     64.4 ml LV SV MOD BP:      28.4 ml RIGHT VENTRICLE RV S prime:     7.73 cm/s TAPSE (M-mode): 1.4 cm LEFT ATRIUM           Index       RIGHT ATRIUM           Index LA diam:      3.40 cm 2.03 cm/m  RA Area:     13.10 cm LA Vol (A2C): 19.1 ml 11.41 ml/m RA Volume:   33.90 ml  20.25 ml/m LA Vol (A4C): 19.9 ml 11.89 ml/m  AORTIC VALVE LVOT Vmax:   70.30 cm/s LVOT Vmean:  44.100 cm/s LVOT VTI:    0.138 m  AORTA Ao Root diam: 3.70 cm Ao Asc diam:  3.50 cm  MITRAL VALVE                TRICUSPID VALVE MV Area (PHT): 5.27 cm     TR Peak grad:   36.2 mmHg MV Decel Time: 144 msec     TR Vmax:        301.00 cm/s MV E velocity: 103.00 cm/s MV A velocity: 64.00 cm/s   SHUNTS MV E/A ratio:  1.61         Systemic VTI:  0.14 m  Systemic Diam: 2.30 cm Buford Dresser MD Electronically signed by Buford Dresser MD Signature Date/Time: 06/06/2020/10:57:09 AM    Final     Cardiac Studies   Echo   06/07/20   1. Left ventricular ejection fraction, by estimation, is 60 to 65%. The left ventricle has normal function. The left ventricle has no regional wall motion abnormalities. Left ventricular diastolic parameters were normal. 2. Right ventricular systolic function is low normal. The right ventricular size is mildly enlarged. There is moderately elevated pulmonary artery systolic pressure. 3. The mitral valve is degenerative. Mild mitral valve regurgitation. Moderate mitral annular calcification. 4. Tricuspid valve regurgitation is moderate. 5. The aortic valve is grossly normal. There is mild calcification of the aortic valve. There is mild thickening of the aortic valve. Aortic valve regurgitation is not visualized. Mild aortic valve sclerosis is present, with no evidence of aortic valve stenosis. Comparison(s): No significant change from prior study.  Patient Profile     87 y.o. male with PMH CAD s/p CABG, PAD s/p R AKA, COPD on home O2, atrial fibrillation not on anticoagulation due to falls, hypertension, hyperlipidemia now admitted with chest pain and NSTEMI with HS tropoins pk 500s.    Assessment & Plan    1.  CAD / NSTEMI  Pt with known severe  CAD s/p CABG 2003   Presents with CP and elevated troppnin.   This has peaked (500s)  Echo unchanged  Normal LVEF    Pt has been on lovenox since admit   With drop in Hg today I would stop   Feel risk above benefit. Add b blocker and follow HR Stop IV fluids  Check BNP     2  Rhythm   PT in and out of atrial flutter   Not on long term anticoag due to falls   Ovreall rate not too bad   Note that pt had been on amiodarone 200 bid earlier this spring after episode of afib with RVR   He did not tolerate it and it was discontinued in APril 2022) Had been on toprol XL 25 at home   WIll start low dose b blocker 12.5 tid metoprolol   Follow BP and HR       3  PVOD   Severe PAD with claudication  He is s/p R AKA 2021;  Rx for cellulitis Being followed in wound care   4. ANemia    H/H 7.4/23   Plt 213   7.8 admit (may be dilutional)  (OUtpt  March Hgb was 10.1 in March; 7.9 APril 1)  Normocytic, normochromic   Fe studies difficult in setting of other med problems   Fe, Fe sat were low but Ferr normal   WIll repeat Fe studies    5  COPD   Severe  FEV1 0.6 L   [facility] at [facility]   ON Chronic prednisone    On O2    Shoud get CXR prior to D/C   6  Hx HTN   BP is OK     7  HL   ON statin continue  8. Hyponatremic    9   Disposition   Appreciate input of hospice/palliative care    I explained to wife, who said they are not ready, that this will help pt with transition to home, help provide assistance that may keep him comfortable at home longer.         For questions or updates, please contact [place] Please consult [email or web address]  for contact info under        Signed, Cecilie Kicks, NP  06/08/2020, 9:23 AM    I have seen and examined the patient along with Cecilie Kicks, NP , PA NP.  I have reviewed the chart, notes and new data.  I agree with PA/NP's note.  Key new complaints: wants to go home Key examination changes:  General: Alert, oriented x3, no distress, HOH Head: no evidence of trauma, PERRL, EOMI, no exophtalmos or lid lag, no myxedema, no xanthelasma; normal ears, nose and oropharynx Neck: normal jugular venous pulsations and no hepatojugular reflux; brisk carotid pulses without delay and no carotid bruits Chest: clear to auscultation, no signs of  consolidation by percussion or palpation, normal fremitus, symmetrical and full respiratory excursions Cardiovascular: normal position and quality of the apical impulse, regular rhythm, normal first and second heart sounds, no murmurs, rubs or gallops Abdomen: no tenderness or distention, no masses by palpation, no abnormal pulsatility or arterial bruits, normal bowel sounds, no hepatosplenomegaly Extremities: s/p R BKA, 2-3 + soft pitting edema L foot, ankle. Dressing over non healing pretibial wound, some ischemic toe areas.  2+ radial, ulnar and brachial pulses bilaterally  Neurological: grossly nonfocal, HOH Psych: Normal mood and affect Key new findings / data: stable Hgb, hyponatremic Na 127 (a recurrent problem for him), K now normal and creat OK. Fe deficiency. Normal LVEF and no wall motion abnormalities to support acute MI , let alone to explain hypotension.  PLAN: Overall, I agree with the initial 05/28 Cardiology Consult impression that his tiny NSTEMI, paroxysmal AFib and hypotension all shared a common trigger, possibly infection. The patient and family continue to want to avoid invasive therapies.  Again has marked anemia w Fe deficiency pattern, marking him as a poor anticoagulation or even dual antiplatelet candidate. Therapeutic options are severely limited. A palliative care approach is appropriate.   Sanda Klein, MD, [place] [phone] 06/08/2020, 1:37 PM

## 2020-06-08 NOTE — NC FL2 (Signed)
[place] LEVEL OF CARE SCREENING TOOL     IDENTIFICATION  Patient Name: [name] Birthdate: [birth year] Sex: male Admission Date (Current Location): 06/05/2020  [facility] and [place] Number:  Herbalist and Address:  [facility], [place] [address] [place], [place] [address]      Provider Number: [id number]  Attending Physician Name and Address:  Hosie Poisson, MD  Relative Name and Phone Number:  [name] [phone]    Current Level of Care: Hospital Recommended Level of Care: [place] Prior Approval Number:    Date Approved/Denied:   PASRR Number: [id number]  Discharge Plan: SNF    Current Diagnoses: Patient Active Problem List   Diagnosis Date Noted  . Hypotension 06/05/2020  . Atrial fibrillation ([place])   . Atrial fibrillation with RVR ([place]) 04/08/2020  . Fall at home, initial encounter 04/08/2020  . Chronic pain 04/08/2020  . [place] 02/27/2020  . Chronic peripheral venous hypertension with lower extremity complication 94/70/9628  . Complete traumatic amputation at level between right hip and knee, initial encounter ([place]) 02/27/2020  . Difficulty in walking, not elsewhere classified 02/27/2020  . Dyspepsia 02/27/2020  . Foot ulcer ([place]) 02/27/2020  . Hyperglycemia 02/27/2020  . Iron deficiency anemia 02/27/2020  . Major depression single episode, in partial remission ([place]) 02/27/2020  . Malnutrition related to chronic disease ([place]) 02/27/2020  . Osteomyelitis ([place]) 02/27/2020  . Paresthesia 02/27/2020  . Right upper quadrant pain 02/27/2020  . Slow transit constipation 02/27/2020  . Spasm of bladder 02/27/2020  . Urinary tract obstruction 02/27/2020  . Vitamin D deficiency 02/27/2020  . Congestive heart failure ([place]) 02/27/2020  . Prediabetes   . Urinary retention   . Normocytic anemia   . Chronic diastolic congestive heart failure ([place])   . Major depressive disorder with current  active episode   . Paroxysmal atrial fibrillation (HCC)   . Right above-knee amputee ([place]) 02/05/2020  . Typical atrial flutter ([place])   . New onset atrial fibrillation ([place])   . Malnutrition of moderate degree 01/30/2020  . Cellulitis of left foot 01/24/2020  . Cellulitis and abscess of left lower extremity 01/24/2020  . Abnormality of gait 10/20/2019  . Pain of left heel 09/01/2019  . Labile blood pressure   . Anxiety state   . Hypothyroidism   . Right hip pain   . Sacral decubitus ulcer   . Post-operative pain   . Chronic systolic congestive heart failure ([place])   . Urinary retention due to benign prostatic hyperplasia 08/20/2019  . SOB (shortness of breath)   . Chronic systolic CHF (congestive heart failure) ([place])   . Hypoalbuminemia due to protein-calorie malnutrition ([place])   . Transaminitis   . Acute blood loss anemia   . Hyponatremia   . Postoperative pain   . Supplemental oxygen dependent   . S/P AKA (above knee amputation) ([place]) 08/14/2019  . Peripheral arterial disease ([place]) 08/11/2019  . Abnormal urinalysis 08/11/2019  . Critical lower limb ischemia ([place]) 08/11/2019  . DNR (do not resuscitate)   . Voiding difficulty   . Palliative care by specialist   . Pressure injury of skin 06/29/2019  . Cellulitis of left lower extremity 06/28/2019  . Cellulitis of right lower extremity 06/27/2019  . AKI (acute kidney injury) ([place]) 06/27/2019  . Headache 10/16/2018  . Severe major depression without psychotic features ([place]) 09/22/2017  . Generalized anxiety disorder 09/22/2017  . DNR (do not resuscitate) discussion 05/10/2017  . Weakness generalized  04/04/2017  . Chest pain 03/24/2017  . AV block, [place] 1 03/24/2017  . CAD (coronary artery disease) 03/24/2017  . Chronic diastolic CHF (congestive heart failure) ([place]) 03/24/2017  . COPD (chronic obstructive pulmonary disease) ([place]) 03/24/2017  . Demand myocardial infarction ([place])   . Elevated troponin 03/09/2017  . Acute  on chronic diastolic heart failure ([place])   . Tachycardia 03/08/2017  . Influenza A 02/04/2017  . Cor pulmonale, chronic (HCC)/ clinical dx  09/13/2016  . Orthostatic hypotension 09/26/2015  . Angina at rest [facility]) 09/24/2015  . BPH (benign prostatic hyperplasia) 09/13/2015  . GERD (gastroesophageal reflux disease) 09/13/2015  . Anxiety disorder 09/13/2015  . Sinusitis, chronic 09/09/2015  . DOE (dyspnea on exertion) 04/15/2015  . Acute heart failure ([place]) 03/18/2015  . Right calf pain 06/18/2014  . Coronary artery disease involving coronary bypass graft of native heart with angina pectoris ([place])   . Peripheral vascular disease ([place])   . Essential hypertension   . Obstructive sleep apnea   . Rhinitis, chronic 01/29/2007  . COPD   GOLD IV/ 02/steroid dep  01/29/2007  . Chronic respiratory failure with hypoxia and hypercapnia ([place]) 01/29/2007  . Hyperlipidemia     Orientation RESPIRATION BLADDER Height & Weight     Self,Time,Situation,Place  O2 (Nasal Cannula 2 liters) Continent (Urethral catheter Latex;Double lumen;straight-tip 20 Fr.) Weight: 134 lb 14.7 oz (61.2 kg) Height:     BEHAVIORAL SYMPTOMS/MOOD NEUROLOGICAL BOWEL NUTRITION STATUS      Continent Diet (Please see discharge summary)  AMBULATORY STATUS COMMUNICATION OF NEEDS Skin   Limited Assist Verbally Other (Comment) (Amputation Right Leg,Ecchymosis arm Bilateral,PI buttocks medial stage 3,dressing change every other day,Incision closed pretibial Left ABD,every 3 days)                       Personal Care Assistance Level of Assistance  Bathing,Feeding,Dressing Bathing Assistance: Limited assistance Feeding assistance: Independent Dressing Assistance: Limited assistance     Functional Limitations Info  Sight,Hearing,Speech Sight Info: Impaired Hearing Info: Impaired Speech Info: Adequate    SPECIAL CARE FACTORS FREQUENCY  PT (By licensed PT),OT (By licensed OT)     PT Frequency: 5x min weekly OT  Frequency: 5x min weekly            Contractures Contractures Info: Not present    Additional Factors Info  Code Status,Allergies,Psychotropic Code Status Info: Partial Allergies Info: Cefdinir,Nitrofurantoin,Sulfa Antibiotics,Sulfonamide Derivatives,Tape,Ciprofloxacin,Nitrofurantoin,Amoxicillin Er,Doxycycline,Keflex (cephalexin),Mirtazapine,Ranolazine,Chlorhexidine Gluconate,Levaquin (levofloxacin),Sertraline Psychotropic Info: FLUoxetine (PROZAC) capsule 10 mg daily at bedtime         Current Medications (06/08/2020):  This is the current hospital active medication list Current Facility-Administered Medications  Medication Dose Route Frequency Provider Last Rate Last Admin  . acetaminophen (TYLENOL) tablet 650 mg  650 mg Oral Q6H PRN Howerter, Justin B, DO   650 mg at 06/06/20 1542   Or  . acetaminophen (TYLENOL) suppository 650 mg  650 mg Rectal Q6H PRN Howerter, Justin B, DO      . acetaminophen (TYLENOL) tablet 500 mg  500 mg Oral TID Rosezella Rumpf, NP   500 mg at 06/08/20 1423  . albuterol (PROVENTIL) (2.5 MG/3ML) 0.083% nebulizer solution 2.5 mg  2.5 mg Nebulization Q4H PRN Howerter, Justin B, DO      . ALPRAZolam Duanne Moron) tablet 0.5 mg  0.5 mg Oral TID PRN Rise Patience, MD   0.5 mg at 06/07/20 2126  . aspirin EC tablet 81 mg  81 mg Oral Daily Howerter, Justin B, DO  81 mg at 06/08/20 0833  . atorvastatin (LIPITOR) tablet 40 mg  40 mg Oral QHS Howerter, Justin B, DO   40 mg at 06/07/20 2126  . budesonide-formoterol (SYMBICORT) 160-4.5 MCG/ACT inhaler - PATIENT SUPPLIED  2 puff Inhalation BID Hammons, Kimberly B, RPH   2 puff at 06/08/20 0831  . calcium carbonate (TUMS - dosed in mg elemental calcium) chewable tablet 200 mg of elemental calcium  1 tablet Oral TID WC Rosezella Rumpf, NP   200 mg of elemental calcium at 06/08/20 1423  . famotidine (PEPCID) tablet 20 mg  20 mg Oral Daily Mansy, Jan A, MD      . finasteride (PROSCAR) tablet 5 mg  5 mg Oral Q lunch  Howerter, Justin B, DO   5 mg at 06/08/20 1206  . FLUoxetine (PROZAC) capsule 10 mg  10 mg Oral QHS [place] Donnamarie Poag, DO   10 mg at 06/07/20 2126  . fluticasone (FLONASE) 50 MCG/ACT nasal spray 1-2 spray  1-2 spray Each Nare Daily Howerter, Justin B, DO   2 spray at 06/08/20 0836  . furosemide (LASIX) tablet 40 mg  40 mg Oral Daily Buford Dresser, MD   40 mg at 06/08/20 3662  . guaiFENesin (MUCINEX) 12 hr tablet 600 mg  600 mg Oral BID [place] Eric J, DO   600 mg at 06/08/20 9476  . metoprolol tartrate (LOPRESSOR) tablet 12.5 mg  12.5 mg Oral TID Fay Records, MD   12.5 mg at 06/08/20 5465  . nitroGLYCERIN (NITROSTAT) SL tablet 0.4 mg  0.4 mg Sublingual Q5 min PRN Howerter, Justin B, DO      . oxyCODONE (Oxy IR/ROXICODONE) immediate release tablet 2.5 mg  2.5 mg Oral Q6H PRN Rosezella Rumpf, NP      . pantoprazole (PROTONIX) EC tablet 40 mg  40 mg Oral Daily Mansy, Jan A, MD   40 mg at 06/08/20 0354  . polyvinyl alcohol (LIQUIFILM TEARS) 1.4 % ophthalmic solution 1 drop  1 drop Both Eyes TID Joselyn Glassman A, RPH   1 drop at 06/08/20 0835  . predniSONE (DELTASONE) tablet 10 mg  10 mg Oral Daily Howerter, Justin B, DO   10 mg at 06/08/20 6568  . sodium chloride (MURO 128) 5 % ophthalmic solution 1 drop  1 drop Both Eyes QID Rise Patience, MD   1 drop at 06/08/20 1424  . Tiotropium Bromide Monohydrate AERS - PATIENT SUPPLIED  2 puff Inhalation Q0600 Hammons, Theone Murdoch, RPH   2 puff at 06/08/20 0836  . vitamin B-12 (CYANOCOBALAMIN) tablet 1,000 mcg  1,000 mcg Oral Daily Howerter, Justin B, DO   1,000 mcg at 06/08/20 1275     Discharge Medications: Please see discharge summary for a list of discharge medications.  Relevant Imaging Results:  Relevant Lab Results:   Additional Information [id number]  Trula Ore, LCSWA

## 2020-06-08 NOTE — Progress Notes (Signed)
PROGRESS NOTE    [name]  BTD:[id number] DOB: 1929-05-08 DOA: 06/05/2020 PCP: [name], MD   Chief Complaint  Patient presents with  . Chest Pain    Brief Narrative:   Prior history of coronary artery disease s/p CABG, chronic diastolic heart failure, chronic respiratory failure with hypoxia requiring about 2 L of nasal cannula oxygen, severe COPD on chronic prednisone therapy, peripheral vascular disease s/p AKA, paroxysmal atrial fibrillation, anemia, chronic Foley catheter placement presents to ED with chest pain  Assessment & Plan:   Principal Problem:   NSTEMI (non-ST elevated myocardial infarction) ([place]) Active Problems:   Hyperlipidemia   COPD   GOLD IV/ 02/steroid dep    Chronic respiratory failure with hypoxia and hypercapnia (HCC)   Elevated troponin   Chronic diastolic CHF (congestive heart failure) ([place])   Hypotension   Atrial fibrillation ([place])   NSTEMI Patient came in with chest pain with elevated troponins, EKG with normal sinus rhythm.  Cardiology consulted recommended medical management at this time.  Continue with aspirin and statin.    History of coronary artery disease s/p CABG No chest pain today    History of chronic respiratory failure with hypoxia, oxygen dependent in the setting of stage IV COPD and steroid-dependent No evidence of acute exacerbation at this time no wheezing heard on exam Continuous bronchodilators as needed and prednisone.   Chronic diastolic heart failure Last echocardiogram showed preserved left ventricular ejection fraction with no regional wall abnormalities Continue with Lasix and strict intake and output and daily weights.  Appreciate cardiology recommendations.    Paroxysmal atrial fibrillation .    Stage II sacral decubitus ulcer present on admission left medial lower extremity wound present on admission Pressure Injury 01/24/20 Coccyx Mid;Lower Stage 2 -  Partial thickness loss of dermis  presenting as a shallow open injury with a red, pink wound bed without slough. (Active)  01/24/20 1445  Location: Coccyx  Location Orientation: Mid;Lower  Staging: Stage 2 -  Partial thickness loss of dermis presenting as a shallow open injury with a red, pink wound bed without slough.  Wound Description (Comments):   Present on Admission: Yes     Pressure Injury 02/06/20 Buttocks Left Stage 2 -  Partial thickness loss of dermis presenting as a shallow open injury with a red, pink wound bed without slough. Above previous wound (Active)  02/06/20 (noted by [facility] nurse on consult) 1459  Location: Buttocks  Location Orientation: Left  Staging: Stage 2 -  Partial thickness loss of dermis presenting as a shallow open injury with a red, pink wound bed without slough.  Wound Description (Comments): Above previous wound  Present on Admission: Yes     Pressure Injury 04/08/20 Buttocks Medial Stage 3 -  Full thickness tissue loss. Subcutaneous fat may be visible but bone, tendon or muscle are NOT exposed. (Active)  04/08/20 1500  Location: Buttocks  Location Orientation: Medial  Staging: Stage 3 -  Full thickness tissue loss. Subcutaneous fat may be visible but bone, tendon or muscle are NOT exposed.  Wound Description (Comments):   Present on Admission: Yes    Wound care.    Chronic hyponatremia Sodium at 127.    In view of his multiple medical problems, significant comorbidities poor prognosis deconditioning and debility, end-stage COPD, coronary artery disease, peripheral vascular disease s/p right AKA with a chronic left leg wound, chronic sacral decubitus ulcer admitted for NSTEMI palliative care consulted and they wish to go home with palliative services  following the patient.      DVT prophylaxis: Lovenox discontinued secondary to a drop in hemoglobin. Code Status: Partial code Family Communication: Wife at bedside Disposition:   Status is: Inpatient  Remains inpatient  appropriate because:Inpatient level of care appropriate due to severity of illness   Dispo:  Patient From: Home  Planned Disposition: To be determined  Medically stable for discharge: No         Consultants:   Cardiology  Procedures: None  Antimicrobials: none.    Subjective: Pt reports right mid abdomen. No nausea, vomiting.   Objective: Vitals:   06/08/20 0412 06/08/20 0737 06/08/20 0831 06/08/20 1141  BP: 101/67 127/83  (!) 122/59  Pulse:  71  68  Resp:  13  15  Temp:  97.6 F (36.4 C)  97.8 F (36.6 C)  TempSrc:  Axillary  Oral  SpO2:  100% 97% 100%  Weight:        Intake/Output Summary (Last 24 hours) at 06/08/2020 1333 Last data filed at 06/08/2020 1114 Gross per 24 hour  Intake 360 ml  Output 800 ml  Net -440 ml   Filed Weights   06/06/20 1259 06/07/20 0451 06/08/20 0339  Weight: 57.8 kg 58 kg 61.2 kg    Examination:  General exam: Appears calm and comfortable  Respiratory system: Clear to auscultation. Respiratory effort normal. Cardiovascular system: S1 & S2 heard, RRR. No JVD, Gastrointestinal system: Abdomen is nondistended, soft and nontender. Normal bowel sounds heard. Central nervous system: Alert and oriented. No focal neurological deficits. Extremities: Right AKI, left leg wound Skin: Stage II sacral/buttocks pressure injury  psychiatry:  Mood & affect appropriate.     Data Reviewed: I have personally reviewed following labs and imaging studies  CBC: Recent Labs  Lab 06/05/20 1600 06/06/20 0052 06/07/20 0122 06/08/20 0257  WBC 17.1* 14.2* 9.3 5.9  NEUTROABS  --  13.5*  --   --   HGB 9.2* 7.8* 7.4* 7.3*  HCT 29.4* 24.4* 23.0* 22.9*  MCV 97.7 96.1 95.0 95.4  PLT 272 209 213 272    Basic Metabolic Panel: Recent Labs  Lab 06/05/20 1600 06/06/20 0052 06/07/20 0122 06/08/20 0257  NA 130* 129* 127* 127*  K 3.6 3.6 3.3* 4.2  CL 87* 90* 88* 89*  CO2 31 33* 33* 31  GLUCOSE 138* 125* 107* 92  BUN 21 22 25* 21  CREATININE  1.32* 1.17 1.12 1.01  CALCIUM 9.2 8.7* 8.6* 8.7*  MG 2.0 2.0  --   --     GFR: Estimated Creatinine Clearance: 43.8 mL/min (by C-G formula based on SCr of 1.01 mg/dL).  Liver Function Tests: Recent Labs  Lab 06/05/20 2000 06/06/20 0052  AST 29 25  ALT 22 18  ALKPHOS 69 59  BILITOT 0.5 0.8  PROT 5.4* 4.7*  ALBUMIN 3.3* 2.9*    CBG: No results for input(s): GLUCAP in the last 168 hours.   Recent Results (from the past 240 hour(s))  SARS CORONAVIRUS 2 (TAT 6-24 HRS) Nasopharyngeal Nasopharyngeal Swab     Status: None   Collection Time: 06/05/20  4:36 PM   Specimen: Nasopharyngeal Swab  Result Value Ref Range Status   SARS Coronavirus 2 NEGATIVE NEGATIVE Final    Comment: (NOTE) SARS-CoV-2 target nucleic acids are NOT DETECTED.  The SARS-CoV-2 RNA is generally detectable in upper and lower respiratory specimens during the acute phase of infection. Negative results do not preclude SARS-CoV-2 infection, do not rule out co-infections with other pathogens, and should not  be used as the sole basis for treatment or other patient management decisions. Negative results must be combined with clinical observations, patient history, and epidemiological information. The expected result is Negative.  Fact Sheet for Patients: SugarRoll.be  Fact Sheet for Healthcare Providers: https://www.woods-mathews.com/  This test is not yet approved or cleared by the [place] FDA and  has been authorized for detection and/or diagnosis of SARS-CoV-2 by FDA under an Emergency Use Authorization (EUA). This EUA will remain  in effect (meaning this test can be used) for the duration of the COVID-19 declaration under Se ction 564(b)(1) of the Act, 21 U.S.C. section 360bbb-3(b)(1), unless the authorization is terminated or revoked sooner.  Performed at [facility], [place] [address], [place], [place] [address]   Culture, blood (Routine X 2) w  Reflex to ID Panel     Status: None (Preliminary result)   Collection Time: 06/06/20 12:38 AM   Specimen: BLOOD RIGHT ARM  Result Value Ref Range Status   Specimen Description BLOOD RIGHT ARM  Final   Special Requests   Final    BOTTLES DRAWN AEROBIC ONLY Blood Culture adequate volume   Culture   Final    NO GROWTH 2 DAYS Performed at [facility], [address] [place], [place] [address]    Report Status PENDING  Incomplete  Culture, blood (Routine X 2) w Reflex to ID Panel     Status: None (Preliminary result)   Collection Time: 06/06/20 12:53 AM   Specimen: BLOOD LEFT HAND  Result Value Ref Range Status   Specimen Description BLOOD LEFT HAND  Final   Special Requests   Final    BOTTLES DRAWN AEROBIC AND ANAEROBIC Blood Culture adequate volume   Culture   Final    NO GROWTH 2 DAYS Performed at [facility], [place] [address] [place], [place] [address]    Report Status PENDING  Incomplete         Radiology Studies: No results found.      Scheduled Meds: . acetaminophen  500 mg Oral TID  . aspirin EC  81 mg Oral Daily  . atorvastatin  40 mg Oral QHS  . budesonide-formoterol  2 puff Inhalation BID  . calcium carbonate  1 tablet Oral TID WC  . famotidine  20 mg Oral Daily  . finasteride  5 mg Oral Q lunch  . FLUoxetine  10 mg Oral QHS  . fluticasone  1-2 spray Each Nare Daily  . furosemide  40 mg Oral Daily  . guaiFENesin  600 mg Oral BID  . metoprolol tartrate  12.5 mg Oral TID  . pantoprazole  40 mg Oral Daily  . polyvinyl alcohol  1 drop Both Eyes TID  . predniSONE  10 mg Oral Daily  . sodium chloride  1 drop Both Eyes QID  . Tiotropium Bromide Monohydrate  2 puff Inhalation Q0600  . vitamin B-12  1,000 mcg Oral Daily   Continuous Infusions:   LOS: 3 days        Hosie Poisson, MD [facility]   To contact the attending provider between 7A-7P or the covering provider during after hours 7P-7A, please log into the web site  [email or web address] and access using universal [place] password for that web site. If you do not have the password, please call the hospital operator.  06/08/2020, 1:33 PM

## 2020-06-08 NOTE — TOC Initial Note (Signed)
Transition of Care [facility]) - Initial/Assessment Note    Patient Details  Name: [name] MRN: [id number] Date of Birth: [birth year]  [facility]) CM/SW Contact:    Trula Ore, [place] Phone Number: 06/08/2020, 2:13 PM  Clinical Narrative:                  CSW received consult for possible SNF placement at time of discharge. CSW spoke with patient and patients spouse regarding PT recommendation of SNF placement at time of discharge. Patient comes from home with spouse.  Patient expressed understanding of PT recommendation and is agreeable to SNF placement at time of discharge. Patient gave CSW permission to fax out initial referral near [facility] and [place] area. Patients first choices would be [facility], [facility], or [facility].Patient has received the COVID vaccines as well as booster.  No further questions reported at this time. CSW to continue to follow and assist with discharge planning needs.  Expected Discharge Plan: Skilled Nursing Facility Barriers to Discharge: Continued Medical Work up   Patient Goals and CMS Choice Patient states their goals for this hospitalization and ongoing recovery are:: to go to SNF CMS Medicare.gov Compare Post Acute Care list provided to:: Patient (patient and patients spouse) Choice offered to / list presented to : [facility]  Expected Discharge Plan and Services Expected Discharge Plan: [place] In-house Referral: Clinical Social Work     Living arrangements for the past 2 months: Single Family Home                                      Prior Living Arrangements/Services Living arrangements for the past 2 months: Single Family Home Lives with:: Self,Spouse Patient language and need for interpreter reviewed:: Yes        Need for Family Participation in Patient Care: Yes (Comment) Care giver support system in place?: Yes (comment)   Criminal Activity/Legal Involvement Pertinent to  Current Situation/Hospitalization: No - Comment as needed  Activities of Daily Living Home Assistive Devices/Equipment: Wheelchair ADL Screening (condition at time of admission) Patient's cognitive ability adequate to safely complete daily activities?: No Is the patient deaf or have difficulty hearing?: Yes Does the patient have difficulty seeing, even when wearing glasses/contacts?: No Does the patient have difficulty concentrating, remembering, or making decisions?: Yes Patient able to express need for assistance with ADLs?: Yes Does the patient have difficulty dressing or bathing?: Yes Independently performs ADLs?: No Communication: Independent Dressing (OT): Needs assistance Is this a change from baseline?: Pre-admission baseline Grooming: Needs assistance Is this a change from baseline?: Pre-admission baseline Feeding: Independent Bathing: Needs assistance Is this a change from baseline?: Pre-admission baseline Toileting: Needs assistance Is this a change from baseline?: Pre-admission baseline In/Out Bed: Needs assistance Is this a change from baseline?: Pre-admission baseline Walks in Home: Needs assistance Is this a change from baseline?: Pre-admission baseline Does the patient have difficulty walking or climbing stairs?: Yes Weakness of Legs: Left Weakness of Arms/Hands: None  Permission Sought/Granted   Permission granted to share information with : Yes, Verbal Permission Granted  Share Information with NAME: [name]  Permission granted to share info w AGENCY: SNF  Permission granted to share info w Relationship: spouse  Permission granted to share info w Contact Information: [name] [phone]  Emotional Assessment Appearance:: Appears stated age Attitude/Demeanor/Rapport: Gracious Affect (typically observed): Calm Orientation: : Oriented to Self,Oriented to Place,Oriented  to  Time,Oriented to Situation Alcohol / Substance Use: Not Applicable Psych  Involvement: No (comment)  Admission diagnosis:  NSTEMI (non-ST elevated myocardial infarction) [facility]) [I21.4] Patient Active Problem List   Diagnosis Date Noted  . Hypotension 06/05/2020  . Atrial fibrillation ([place])   . Atrial fibrillation with RVR ([place]) 04/08/2020  . Fall at home, initial encounter 04/08/2020  . Chronic pain 04/08/2020  . Asterixis 02/27/2020  . Chronic peripheral venous hypertension with lower extremity complication 02/63/7858  . Complete traumatic amputation at level between right hip and knee, initial encounter ([place]) 02/27/2020  . Difficulty in walking, not elsewhere classified 02/27/2020  . Dyspepsia 02/27/2020  . Foot ulcer ([place]) 02/27/2020  . Hyperglycemia 02/27/2020  . Iron deficiency anemia 02/27/2020  . Major depression single episode, in partial remission ([place]) 02/27/2020  . Malnutrition related to chronic disease ([place]) 02/27/2020  . Osteomyelitis ([place]) 02/27/2020  . Paresthesia 02/27/2020  . Right upper quadrant pain 02/27/2020  . Slow transit constipation 02/27/2020  . Spasm of bladder 02/27/2020  . Urinary tract obstruction 02/27/2020  . Vitamin D deficiency 02/27/2020  . Congestive heart failure ([place]) 02/27/2020  . Prediabetes   . Urinary retention   . Normocytic anemia   . Chronic diastolic congestive heart failure ([place])   . Major depressive disorder with current active episode   . Paroxysmal atrial fibrillation (HCC)   . Right above-knee amputee ([place]) 02/05/2020  . Typical atrial flutter ([place])   . New onset atrial fibrillation ([place])   . Malnutrition of moderate degree 01/30/2020  . Cellulitis of left foot 01/24/2020  . Cellulitis and abscess of left lower extremity 01/24/2020  . Abnormality of gait 10/20/2019  . Pain of left heel 09/01/2019  . Labile blood pressure   . Anxiety state   . Hypothyroidism   . Right hip pain   . Sacral decubitus ulcer   . Post-operative pain   . Chronic systolic congestive heart failure ([place])   . Urinary  retention due to benign prostatic hyperplasia 08/20/2019  . SOB (shortness of breath)   . Chronic systolic CHF (congestive heart failure) ([place])   . Hypoalbuminemia due to protein-calorie malnutrition ([place])   . Transaminitis   . Acute blood loss anemia   . Hyponatremia   . Postoperative pain   . Supplemental oxygen dependent   . S/P AKA (above knee amputation) ([place]) 08/14/2019  . Peripheral arterial disease ([place]) 08/11/2019  . Abnormal urinalysis 08/11/2019  . Critical lower limb ischemia ([place]) 08/11/2019  . DNR (do not resuscitate)   . Voiding difficulty   . Palliative care by specialist   . Pressure injury of skin 06/29/2019  . Cellulitis of left lower extremity 06/28/2019  . Cellulitis of right lower extremity 06/27/2019  . AKI (acute kidney injury) ([place]) 06/27/2019  . Headache 10/16/2018  . Severe major depression without psychotic features ([place]) 09/22/2017  . Generalized anxiety disorder 09/22/2017  . DNR (do not resuscitate) discussion 05/10/2017  . Weakness generalized 04/04/2017  . Chest pain 03/24/2017  . AV block, [place] 1 03/24/2017  . CAD (coronary artery disease) 03/24/2017  . Chronic diastolic CHF (congestive heart failure) ([place]) 03/24/2017  . COPD (chronic obstructive pulmonary disease) ([place]) 03/24/2017  . Demand myocardial infarction ([place])   . Elevated troponin 03/09/2017  . Acute on chronic diastolic heart failure ([place])   . Tachycardia 03/08/2017  . Influenza A 02/04/2017  . Cor pulmonale, chronic (HCC)/ clinical dx  09/13/2016  . Orthostatic hypotension 09/26/2015  . Angina at rest [facility]) 09/24/2015  .  BPH (benign prostatic hyperplasia) 09/13/2015  . GERD (gastroesophageal reflux disease) 09/13/2015  . Anxiety disorder 09/13/2015  . Sinusitis, chronic 09/09/2015  . DOE (dyspnea on exertion) 04/15/2015  . Acute heart failure ([place]) 03/18/2015  . Right calf pain 06/18/2014  . Coronary artery disease involving coronary bypass graft of native heart with angina  pectoris ([place])   . Peripheral vascular disease ([place])   . Essential hypertension   . Obstructive sleep apnea   . Rhinitis, chronic 01/29/2007  . COPD   GOLD IV/ 02/steroid dep  01/29/2007  . Chronic respiratory failure with hypoxia and hypercapnia ([place]) 01/29/2007  . Hyperlipidemia    PCP:  [name], MD Pharmacy:   [facility] [place], [place] [address] [place] [address] Phone: [phone] Fax: [phone]     Social Determinants of Health (SDOH) Interventions    Readmission Risk Interventions No flowsheet data found.

## 2020-06-08 NOTE — Evaluation (Signed)
Physical Therapy Evaluation Patient Details Name: [name] MRN: [id number] DOB: 1929-09-30 Today's Date: 06/08/2020   History of Present Illness  Pt is an 87 y.o. male admitted 06/05/20 with chest pain; workup for NSTEMI. Pt also with chronic LLE and coccyx wounds. PMH includes CAD (s/p CABG 2003), HF, severe COPD on chronic prednisone therapy, chronic hypoxic respiratory failure (2L O2 baseline), PVD s/p R AKA, HTN, PAF, anemia, chronic foley, depression.    Clinical Impression  Pt presents with an overall decrease in functional mobility secondary to above. PTA, pt from home with elderly wife who is able to provide limited physical assist; pt transfers at w/c-level, requires assist for ADLs, wears chronic 2L O2 [place]. Today, pt able to tolerate prolonged sitting EOB activity; declines transfer OOB secondary to fatigue. Increased time discussing assist needs and recommendation for post-acute rehab at SNF to maximize functional mobility and decrease caregiver burden; pt reports concern about being able to tolerate rehab again. Will follow acutely to address established goals.  SpO2 98% on 2L O2 [place] HR 83    Follow Up Recommendations SNF;Supervision for mobility/OOB    Equipment Recommendations  None recommended by PT    Recommendations for Other Services       Precautions / Restrictions Precautions Precautions: Fall;Other (comment) Precaution Comments: Fragile skin with chronic LLE/sacral wounds; h/o R AKA; baseline 2L O2; chronic foley      Mobility  Bed Mobility Overal bed mobility: Needs Assistance             General bed mobility comments: Seated EOB upon arrival    Transfers                 General transfer comment: Pt declined secondary to fatigues; states, "I'm too weak to do anything today"  Ambulation/Gait                Stairs            Wheelchair Mobility    Modified Rankin (Stroke Patients Only)       Balance Overall balance  assessment: Needs assistance Sitting-balance support: No upper extremity supported;Feet unsupported Sitting balance-Leahy Scale: Fair Sitting balance - Comments: Seated unsupported EOB with forward lean                                     Pertinent Vitals/Pain Pain Assessment: Faces Faces Pain Scale: Hurts a little bit Pain Location: Neck, shoulders, stomach Pain Descriptors / Indicators: Aching;Discomfort Pain Intervention(s): Limited activity within patient's tolerance;Monitored during session    Home Living Family/patient expects to be discharged to:: Private residence Living Arrangements: Spouse/significant other Available Help at Discharge: Family;Available PRN/intermittently Type of Home: House Home Access: Level entry     Home Layout: Multi-level;Bed/bath upstairs Home Equipment: Walker - 2 wheels;Cane - single point;Shower seat;Transport chair;Tub bench;Other (comment) (stair lift, drop-arm BSC) Additional Comments: Wife is elderly and unable to provide physical assist. When asked about other assist available, pt reports, "She (spouse) refuses to let anyone else into the house to help" - sounds like a daughter is available PRN    Prior Function Level of Independence: Needs assistance   Gait / Transfers Assistance Needed: Pt transfers at wheelchair-level, intermittent assist from wife. Primarily sedentary  ADL's / Homemaking Assistance Needed: Assist from wife for all ADL tasks  Comments: Wears 2L O2 baseline     Hand Dominance  Extremity/Trunk Assessment   Upper Extremity Assessment Upper Extremity Assessment: Generalized weakness    Lower Extremity Assessment Lower Extremity Assessment: Generalized weakness;RLE deficits/detail;LLE deficits/detail RLE Deficits / Details: H/o R AKA LLE Deficits / Details: Functionally >/3/5 throughout, multiple wounds with dressings    Cervical / Trunk Assessment Cervical / Trunk Assessment: Kyphotic   Communication   Communication: HOH (significantly)  Cognition Arousal/Alertness: Awake/alert Behavior During Therapy: Flat affect Overall Cognitive Status: No family/caregiver present to determine baseline cognitive functioning                                 General Comments: Answering questions appropriately, sometimes requiring repetition due to [facility]; apparent slowed processing, appears exacerbated by significant fatigue; "I'm too weak to do anything today"      General Comments General comments (skin integrity, edema, etc.): Discussed potential for SNF-level therapies, pt unsure with preference to return home, worries he is too weak to tolerate rehab; also reports concern for help he will need at home    Exercises     Assessment/Plan    PT Assessment Patient needs continued PT services  PT Problem List Decreased strength;Decreased activity tolerance;Decreased balance;Decreased mobility;Decreased cognition;Decreased safety awareness       PT Treatment Interventions DME instruction;Functional mobility training;Therapeutic activities;Balance training;Therapeutic exercise;Patient/family education;Wheelchair mobility training    PT Goals (Current goals can be found in the Care Plan section)  Acute Rehab PT Goals Patient Stated Goal: Return home, "I always feel better once I'm home" PT Goal Formulation: With patient Time For Goal Achievement: July 11, 2020 Potential to Achieve Goals: Fair    Frequency Min 3X/week   Barriers to discharge Decreased caregiver support      Co-evaluation               AM-PAC PT "6 Clicks" Mobility  Outcome Measure Help needed turning from your back to your side while in a flat bed without using bedrails?: A Little Help needed moving from lying on your back to sitting on the side of a flat bed without using bedrails?: A Little Help needed moving to and from a bed to a chair (including a wheelchair)?: A Little Help needed standing  up from a chair using your arms (e.g., wheelchair or bedside chair)?: A Lot Help needed to walk in hospital room?: Total Help needed climbing 3-5 steps with a railing? : Total 6 Click Score: 13    End of Session Equipment Utilized During Treatment: Oxygen Activity Tolerance: Patient limited by fatigue Patient left: in bed;with call bell/phone within reach;with bed alarm set Nurse Communication: Mobility status PT Visit Diagnosis: Other abnormalities of gait and mobility (R26.89);Muscle weakness (generalized) (M62.81)    Time: 7591-6384 PT Time Calculation (min) (ACUTE ONLY): 16 min   Charges:   PT Evaluation $PT Eval Moderate Complexity: [place], PT, DPT Acute Rehabilitation Services  Pager [phone] Office [place] 06/08/2020, 10:22 AM

## 2020-06-08 NOTE — Progress Notes (Addendum)
OT Cancellation Note  Patient Details Name: [name] MRN: [id number] DOB: 1929-02-11   Cancelled Treatment:    Reason Eval/Treat Not Completed: Fatigue/lethargy limiting ability to participate (Per discussion with PT, pt requires OT to see patient much later today or may need to alternate days due to fatigue and sickness.)  OT will re-attempt for OT eval later today.  1:00pm Pt sitting up in recliner s/p PT session earlier today eating and reports "I'm not going to be up for any more therapy today." OT to return to check on pt tomorrow, 6/1 when pt is not too fatigued after PT session.   Jefferey Pica, OTR/L Acute Rehabilitation Services Pager: [phone] Office: [phone]  Jefferey Pica 06/08/2020, 12:01 PM

## 2020-06-09 DIAGNOSIS — I219 Acute myocardial infarction, unspecified: Secondary | ICD-10-CM | POA: Diagnosis not present

## 2020-06-09 DIAGNOSIS — I480 Paroxysmal atrial fibrillation: Secondary | ICD-10-CM | POA: Diagnosis not present

## 2020-06-09 DIAGNOSIS — I5032 Chronic diastolic (congestive) heart failure: Secondary | ICD-10-CM | POA: Diagnosis not present

## 2020-06-09 LAB — BASIC METABOLIC PANEL
Anion gap: 8 (ref 5–15)
BUN: 17 mg/dL (ref 8–23)
CO2: 32 mmol/L (ref 22–32)
Calcium: 8.9 mg/dL (ref 8.9–10.3)
Chloride: 88 mmol/L — ABNORMAL LOW (ref 98–111)
Creatinine, Ser: 0.95 mg/dL (ref 0.61–1.24)
GFR, Estimated: >60 mL/min (ref >60)
Glucose, Bld: 87 mg/dL (ref 70–99)
Potassium: 4.1 mmol/L (ref 3.5–5.1)
Sodium: 128 mmol/L — ABNORMAL LOW (ref 135–145)

## 2020-06-09 MED ORDER — FUROSEMIDE 20 MG PO TABS
20.0000 mg | ORAL_TABLET | Freq: Once | ORAL | Status: AC
  Administered 2020-06-09: 20 mg via ORAL
  Filled 2020-06-09: qty 1

## 2020-06-09 MED ORDER — PANTOPRAZOLE SODIUM 40 MG PO TBEC
40.0000 mg | DELAYED_RELEASE_TABLET | Freq: Two times a day (BID) | ORAL | Status: DC
  Administered 2020-06-09 – 2020-06-10 (×2): 40 mg via ORAL
  Filled 2020-06-09 (×2): qty 1

## 2020-06-09 MED FILL — Sodium Chloride IV Soln 0.9%: INTRAVENOUS | Qty: 250 | Status: AC

## 2020-06-09 NOTE — Progress Notes (Signed)
Patient complaining of 6.5/10 Chest Pain  O2 increased to 4L [place]  BP= 125/66 HR= 72 At 1332  EKG obtained  1 Nitro Given at 1340  Post nitro BP=98/53  HR= 69 At 1345  DR. Akula notified and aware, states she is coming to see the patient.

## 2020-06-09 NOTE — Consult Note (Signed)
   [facility] CM Inpatient Consult   06/09/2020  JEVAUGHN DEGOLLADO 1929-05-25 [id number]   [place] [facility] [ACO] Patient:  Medicare CMS DCE  Primary Care Provider: [name], MD   Patient screened for hospitalization with noted extreme high risk score for unplanned readmission risk and with a history of  [place] [facility] outreach in the past. Review of patient's medical record reveals patient is being recommended for a skilled nursing facility level of care.  Plan:  Will alert [facility] PAC RN if going to a [facility] affiliated SNF, if it's not a [facility] affiliated facility then the patient's transition of care needs are to be met at the facility. 06/10/20 1323: Patient to [facility] [non-[facility] facility]  For questions contact:   Natividad Brood, RN BSN [facility] Liaison  [phone] business mobile phone Toll free office [phone]  Fax number: [phone] [email or web address]

## 2020-06-09 NOTE — Progress Notes (Signed)
Physical Therapy Treatment Patient Details Name: [name] MRN: [id number] DOB: 01/29/1929 Today's Date: 06/09/2020    History of Present Illness Pt is an 87 y.o. male admitted 06/05/20 with chest pain; workup for NSTEMI. Pt also with chronic LLE and coccyx wounds. PMH includes CAD (s/p CABG 2003), HF, severe COPD on chronic prednisone therapy, chronic hypoxic respiratory failure (2L O2 baseline), PVD s/p R AKA, HTN, PAF, anemia, chronic foley, depression.   PT Comments    Pt progressing with mobility. Today's session focused on transfer training with sit<>stand progression for BUE/LLE strengthening; pt requiring multiple seated rest breaks during activity. Pt requiring modA to achieve standing with RW. [name] present and supportive; hopeful for d/c to SNF tomorrow. Pt remains limited by generalized weakness, decreased activity tolerance, impaired balance strategies; continue to recommend SNF-level therapies to maximize functional mobility and independence prior to return home.   Follow Up Recommendations  SNF;Supervision for mobility/OOB     Equipment Recommendations  None recommended by PT    Recommendations for Other Services       Precautions / Restrictions Precautions Precautions: Fall;Other (comment) Precaution Comments: Fragile skin with chronic LLE/sacral wounds; h/o R AKA; baseline 2L O2; chronic foley Required Braces or Orthoses: Other Brace Other Brace: L post-op shoe (prefers to wear with transfers)    Mobility  Bed Mobility               General bed mobility comments: Received sitting in recliner    Transfers Overall transfer level: Needs assistance Equipment used: Rolling walker (2 wheeled) Transfers: Sit to/from Stand Sit to Stand: Mod assist         General transfer comment: Performed multiple partial sit<>stands from recliner, heavy reliance on BUE support, pt able to fully offload buttocks with intermittent minA, but reports fearful of falling  and hesitant to work on transition of UE support to RW; intermittent seated rest breaks due to fatigue with partial standing trials; pt able to achieve fully upright standing with modA for trunk elevation and stability  Ambulation/Gait                 Stairs             Wheelchair Mobility    Modified Rankin (Stroke Patients Only)       Balance Overall balance assessment: Needs assistance Sitting-balance support: No upper extremity supported;Feet unsupported Sitting balance-Leahy Scale: Fair       Standing balance-Leahy Scale: Poor Standing balance comment: Reliant on UE support and external assist                            Cognition Arousal/Alertness: Awake/alert Behavior During Therapy: WFL for tasks assessed/performed Overall Cognitive Status: History of cognitive impairments - at baseline                                        Exercises Other Exercises Other Exercises: Chair push-ups and multiple partial sit<>stands (~10x)    General Comments General comments (skin integrity, edema, etc.): [name] [name]) present and supportive      Pertinent Vitals/Pain Pain Assessment: Faces Faces Pain Scale: Hurts a little bit Pain Location: Generalized Pain Descriptors / Indicators: Discomfort;Tiring Pain Intervention(s): Monitored during session;Limited activity within patient's tolerance    Home Living  Prior Function            PT Goals (current goals can now be found in the care plan section) Progress towards PT goals: Progressing toward goals    Frequency    Min 2X/week      PT Plan Current plan remains appropriate    Co-evaluation              AM-PAC PT "6 Clicks" Mobility   Outcome Measure  Help needed turning from your back to your side while in a flat bed without using bedrails?: None Help needed moving from lying on your back to sitting on the side of a flat bed  without using bedrails?: A Little Help needed moving to and from a bed to a chair (including a wheelchair)?: A Little Help needed standing up from a chair using your arms (e.g., wheelchair or bedside chair)?: A Lot Help needed to walk in hospital room?: Total Help needed climbing 3-5 steps with a railing? : Total 6 Click Score: 14    End of Session Equipment Utilized During Treatment: Oxygen;Gait belt Activity Tolerance: Patient tolerated treatment well Patient left: in chair;with call bell/phone within reach;with chair alarm set;with family/visitor present Nurse Communication: Mobility status PT Visit Diagnosis: Other abnormalities of gait and mobility (R26.89);Muscle weakness (generalized) (M62.81)     Time: 5170-0174 PT Time Calculation (min) (ACUTE ONLY): 27 min  Charges:  $Therapeutic Exercise: 8-22 mins $Therapeutic Activity: 8-22 mins                     Mabeline Caras, PT, DPT Acute Rehabilitation Services  Pager [phone] Office [place] 06/09/2020, 5:37 PM

## 2020-06-09 NOTE — Progress Notes (Signed)
Occupational Therapy Evaluation Patient Details Name: [name] MRN: [id number] DOB: 1929-04-21 Today's Date: 06/09/2020    History of Present Illness Pt is an 87 y.o. male admitted 06/05/20 with chest pain; workup for NSTEMI. Pt also with chronic LLE and coccyx wounds. PMH includes CAD (s/p CABG 2003), HF, severe COPD on chronic prednisone therapy, chronic hypoxic respiratory failure (2L O2 baseline), PVD s/p R AKA, HTN, PAF, anemia, chronic foley, depression.   Clinical Impression   At baseline, pt able to complete his squat pivot transfers with S of his wife and requires min A with ADL. Currently requires mod A with squat pivot transfers and Max A with LB ADL due to fatigue. VSS on 2L. Recommend SNF for rehab due to functional decline. Wife states she is agreeable to certain SNFs. If pt/wife decline SNF, recommend assistance with all mobility and ADL tasks. If pt does not go to SNF, recommend SW/CM give pt/wife information on home care services to help wife with pt's care. If DC home, had discussion with wife regarding need to move hospital bed to main floor as pt transferring to stair lift chair is not safe at this time. Wife verbalized understanding. Will follow acutely.     Follow Up Recommendations  Supervision/Assistance - 24 hour;SNF (HHOT if declines SNF)    Equipment Recommendations  None recommended by OT    Recommendations for Other Services Other (comment) (SW to give community care agencies)     Precautions / Restrictions Precautions Precautions: Fall;Other (comment) Precaution Comments: Fragile skin with chronic LLE/sacral wounds; h/o R AKA; baseline 2L O2; chronic foley Required Braces or Orthoses: Other Brace Other Brace: L post-op shoe (prefers to wear with transfers)      Mobility Bed Mobility               General bed mobility comments: sitting EOB    Transfers        Mod A squat pivot with increased time              Balance     Sitting  balance-Leahy Scale: Fair                                     ADL either performed or assessed with clinical judgement   ADL Overall ADL's : Needs assistance/impaired         Upper Body Bathing: Minimal assistance   Lower Body Bathing: Moderate assistance;Bed level   Upper Body Dressing : Moderate assistance   Lower Body Dressing: Maximal assistance;Bed level   Toilet Transfer: Moderate assistance   Toileting- Clothing Manipulation and Hygiene: Maximal assistance       Functional mobility during ADLs: Moderate assistance       Vision Baseline Vision/History: Wears glasses       Perception     Praxis      Pertinent Vitals/Pain Pain Assessment: Faces Faces Pain Scale: Hurts little more Pain Location: Neck, shoulders, stomach ("everywhre") Pain Descriptors / Indicators: Aching;Discomfort Pain Intervention(s): Limited activity within patient's tolerance     Hand Dominance Right   Extremity/Trunk Assessment Upper Extremity Assessment Upper Extremity Assessment: Generalized weakness   Lower Extremity Assessment Lower Extremity Assessment: Defer to PT evaluation RLE Deficits / Details: H/o R AKA LLE Deficits / Details: L LE wound       Communication Communication Communication: HOH   Cognition Arousal/Alertness: Awake/alert Behavior During Therapy: WFL for tasks assessed/performed  Overall Cognitive Status: Within Functional Limits for tasks assessed Area of Impairment:  (slow processing and delayed intitiation which is baseline)                                   General Comments       Exercises     Shoulder Instructions      Home Living Family/patient expects to be discharged to:: Private residence Living Arrangements: Spouse/significant other Available Help at Discharge: Family;Available PRN/intermittently Type of Home: House Home Access: Level entry     Home Layout: Multi-level;Bed/bath upstairs Alternate  Level Stairs-Number of Steps: Level entry from carport. Has stair lift on each set of steps - 9 steps (w/ 1 rail) to kitchen/dining room/living room; 6 additional steps (w/ 1 rail) to bedroom/bathroom Alternate Level Stairs-Rails: Right Bathroom Shower/Tub: Walk-in shower;Tub/shower unit   Bathroom Toilet: Standard Bathroom Accessibility: Yes   Home Equipment: Walker - 2 wheels;Cane - single point;Shower seat;Transport chair;Tub bench;Other (comment)   Additional Comments: Wife is elderly and unable to provide physical assist. When asked about other assist available, pt reports, "She (spouse) refuses to let anyone else into the house to help" - sounds like a daughter is available PRN      Prior Functioning/Environment Level of Independence: Needs assistance  Gait / Transfers Assistance Needed: Pt transfers at wheelchair-level, intermittent assist from wife. Primarily sedentary ADL's / Homemaking Assistance Needed: Assist from wife for all ADL tasks Communication / Swallowing Assistance Needed: HOH Comments: Wears 2L O2 baseline        OT Problem List: Decreased strength;Decreased range of motion;Decreased activity tolerance;Impaired balance (sitting and/or standing);Decreased safety awareness;Decreased knowledge of use of DME or AE;Cardiopulmonary status limiting activity;Impaired UE functional use;Pain      OT Treatment/Interventions: Self-care/ADL training;Therapeutic exercise;Neuromuscular education;Energy conservation;DME and/or AE instruction;Therapeutic activities;Patient/family education;Balance training    OT Goals(Current goals can be found in the care plan section) Acute Rehab OT Goals Patient Stated Goal: to get stronger OT Goal Formulation: With patient/family Time For Goal Achievement: 06/23/20 Potential to Achieve Goals: Good  OT Frequency: Min 2X/week   Barriers to D/C:            Co-evaluation              AM-PAC OT "6 Clicks" Daily Activity     Outcome  Measure Help from another person eating meals?: None Help from another person taking care of personal grooming?: A Little Help from another person toileting, which includes using toliet, bedpan, or urinal?: A Lot Help from another person bathing (including washing, rinsing, drying)?: A Lot Help from another person to put on and taking off regular upper body clothing?: A Lot Help from another person to put on and taking off regular lower body clothing?: A Lot 6 Click Score: 15   End of Session Equipment Utilized During Treatment: Gait belt Nurse Communication: Other (comment);Mobility status (DC needs)  Activity Tolerance: Patient limited by fatigue Patient left: in chair;with call bell/phone within reach;with family/visitor present  OT Visit Diagnosis: Other abnormalities of gait and mobility (R26.89);Muscle weakness (generalized) (M62.81);History of falling (Z91.81);Other symptoms and signs involving cognitive function;Pain;Dizziness and giddiness (R42) Pain - part of body:  (everywhere)                Time: 9735-3299 OT Time Calculation (min): 34 min Charges:  OT General Charges $OT Visit: 1 Visit OT Evaluation $OT Eval Moderate Complexity: 1 Mod OT  Treatments $Self Care/Home Management : 8-22 mins  Maurie Boettcher, OT/L   Acute OT Clinical Specialist Acute Rehabilitation Services Pager [phone] Office [phone]   [facility] 06/09/2020, 1:50 PM

## 2020-06-09 NOTE — TOC Progression Note (Addendum)
Transition of Care [facility]) - Progression Note    Patient Details  Name: [name] MRN: [id number] Date of Birth: [birth year]  Transition of Care [facility]) CM/SW [place], [place] Phone Number: 06/09/2020, 10:42 AM  Clinical Narrative:     Pt's preferred SNF's did not offer bed. CSW called to double check. [facility] has no beds available. [facility] cannot accept clinically. [facility] only accept pt's from within their assisted living community.   CSW met with pt and pt wife bedside. CSW explained that he was informed pt may be considering going home. Wife explained that they would only take pt home if a SNF that they found acceptable was not available. CSW explained that their preferred facilities could not offer a bed. CSW provided facilities that could offer a bed. Wife reviewed offers and found them to be unacceptable. CSW explained those are the available facilities. CSW inquired if pt would return home if they did not want any of the available facilities. Wife did not give a clear answer and explained that she wanted to talk with the [facility] who pt is active with. She also requested that palliative meet with her again. CSW explained he would follow up again in the afternoon but that a decision would need to be made.   1100: OT suggested considering [name] if pt were to return home. CSW contacted [name]; they are not currently doing [facility] program.   CSW called pt wife and explained additional offer of [facility]. Wife explained she is contacting [facility] and if they are unable to accept pt  She would go with [facility]. CSW contacted [facility]; left message requesting return call.   1424: Called [facility] for update; no answer left voicemail 1457: CSW called [facility] again; no answer. Did not leave a voicemail  Expected Discharge Plan: [place] Barriers to Discharge: Continued Medical Work up  Expected  Discharge Plan and Services Expected Discharge Plan: [place] In-house Referral: Clinical Social Work     Living arrangements for the past 2 months: Single Family Home                                       Social Determinants of Health (SDOH) Interventions    Readmission Risk Interventions No flowsheet data found.

## 2020-06-09 NOTE — TOC Progression Note (Signed)
Transition of Care [facility]) - Progression Note    Patient Details  Name: REXTON GREULICH MRN: [id number] Date of Birth: [birth year]  Transition of Care [facility]) CM/SW [place], [place] Phone Number: 06/09/2020, 12:21 PM  Clinical Narrative:     CSW received call from patient spouse regarding patients SNF bed offers. CSW let patients spouse know that [facility] offered. Patients spouse declined SNF bed offer with [facility]. Patients spouse asked about [facility]. CSW let patients spouse know that [place] worker is waiting to hear back from [facility] to see if they can make SNF bed offer. CSW let patients spouse know that CSW will give her a call when CSW hears back from [facility]. CSW will continue to follow.   Expected Discharge Plan: [place] Barriers to Discharge: Continued Medical Work up  Expected Discharge Plan and Services Expected Discharge Plan: [place] In-house Referral: Clinical Social Work     Living arrangements for the past 2 months: Single Family Home                                       Social Determinants of Health (SDOH) Interventions    Readmission Risk Interventions No flowsheet data found.

## 2020-06-09 NOTE — Progress Notes (Signed)
PROGRESS NOTE    [name]  WER:[id number] DOB: September 15, 1929 DOA: 06/05/2020 PCP: [name], MD   Chief Complaint  Patient presents with  . Chest Pain    Brief Narrative:   Prior history of coronary artery disease s/p CABG, chronic diastolic heart failure, chronic respiratory failure with hypoxia requiring about 2 L of nasal cannula oxygen, severe COPD on chronic prednisone therapy, peripheral vascular disease s/p AKA, paroxysmal atrial fibrillation, anemia, chronic Foley catheter placement presents to ED with chest pain.  He was admitted for the further evaluation of NSTEMI.  In view of his extensive cardiac history, marked anemia, poor candidate for anticoagulation or dual antiplatelet agents.  Therapeutic options are limited and cardiology recommended palliative care consult. Pt seen and examined at bedside.  He reports occasional chest pain, EKG does not show significant ST and T wave changes.  Assessment & Plan:   Principal Problem:   Demand myocardial infarction [facility]) Active Problems:   Hyperlipidemia   COPD   GOLD IV/ 02/steroid dep    Chronic respiratory failure with hypoxia and hypercapnia (HCC)   Elevated troponin   Chronic diastolic CHF (congestive heart failure) (HCC)   Hypotension   Atrial fibrillation ([place])   NSTEMI Patient came in with chest pain with elevated troponins, EKG with normal sinus rhythm.  Cardiology consulted recommended medical management at this time, in view of his multiple comorbidities, marked anemia poor candidate for dual platelet agents, recommended palliative care approach.  Continue with aspirin and statin.    History of coronary artery disease s/p CABG Intermittent atypical chest pain.  As needed nitroglycerin ordered.    History of chronic respiratory failure with hypoxia, oxygen dependent in the setting of stage IV COPD and steroid-dependent No evidence of acute exacerbation at this time no wheezing heard on exam Continuous  bronchodilators as needed and prednisone.   Chronic diastolic heart failure Last echocardiogram showed preserved left ventricular ejection fraction with no regional wall abnormalities Continue with Lasix 40 mg daily and strict intake and output and daily weights.  Appreciate cardiology recommendations.    Paroxysmal atrial fibrillation .  Rate better controlled With metoprolol    Stage II sacral decubitus ulcer present on admission left medial lower extremity wound present on admission Pressure Injury 01/24/20 Coccyx Mid;Lower Stage 2 -  Partial thickness loss of dermis presenting as a shallow open injury with a red, pink wound bed without slough. (Active)  01/24/20 1445  Location: Coccyx  Location Orientation: Mid;Lower  Staging: Stage 2 -  Partial thickness loss of dermis presenting as a shallow open injury with a red, pink wound bed without slough.  Wound Description (Comments):   Present on Admission: Yes     Pressure Injury 02/06/20 Buttocks Left Stage 2 -  Partial thickness loss of dermis presenting as a shallow open injury with a red, pink wound bed without slough. Above previous wound (Active)  02/06/20 (noted by [facility] nurse on consult) 1459  Location: Buttocks  Location Orientation: Left  Staging: Stage 2 -  Partial thickness loss of dermis presenting as a shallow open injury with a red, pink wound bed without slough.  Wound Description (Comments): Above previous wound  Present on Admission: Yes     Pressure Injury 04/08/20 Buttocks Medial Stage 3 -  Full thickness tissue loss. Subcutaneous fat may be visible but bone, tendon or muscle are NOT exposed. (Active)  04/08/20 1500  Location: Buttocks  Location Orientation: Medial  Staging: Stage 3 -  Full thickness  tissue loss. Subcutaneous fat may be visible but bone, tendon or muscle are NOT exposed.  Wound Description (Comments):   Present on Admission: Yes    Continue with local wound care.    Chronic  hyponatremia Sodium at 127.  Probably secondary to diuresis    In view of his multiple medical problems, significant comorbidities poor prognosis deconditioning and debility, end-stage COPD, coronary artery disease, peripheral vascular disease s/p right AKA with a chronic left leg wound, chronic sacral decubitus ulcer admitted for NSTEMI palliative care consulted and they wish to go to SNF with palliative services following the patient.      DVT prophylaxis: Lovenox discontinued secondary to a drop in hemoglobin. Code Status: Partial code Family Communication: Wife at bedside Disposition:   Status is: Inpatient  Remains inpatient appropriate because:Inpatient level of care appropriate due to severity of illness   Dispo:  Patient From: Home  Planned Disposition: To be determined  Medically stable for discharge: No         Consultants:   Cardiology  Procedures: None  Antimicrobials: none.    Subjective: Patient reports occasional atypical chest pain nonradiating  Objective: Vitals:   06/09/20 0700 06/09/20 1016 06/09/20 1100 06/09/20 1422  BP: 103/67 118/61 114/63 124/72  Pulse: 63 71 72   Resp: 17  18 (!) 21  Temp: 97.6 F (36.4 C)  (!) 97.4 F (36.3 C)   TempSrc: Oral  Oral   SpO2: 98%  100%   Weight:        Intake/Output Summary (Last 24 hours) at 06/09/2020 1440 Last data filed at 06/09/2020 1354 Gross per 24 hour  Intake 840 ml  Output 2050 ml  Net -1210 ml   Filed Weights   06/07/20 0451 06/08/20 0339 06/09/20 0548  Weight: 58 kg 61.2 kg 59.8 kg    Examination:  General exam: Alert and comfortable not in any kind of distress Respiratory system: Air entry fair bilateral no wheezing or rhonchi Cardiovascular system: S1-S2 heard, regular rate rhythm, no JVD Gastrointestinal system: Abdomen is soft nontender bowel sounds normal Central nervous system: Alert and oriented to person and place Extremities: Right AKI, left leg wound  Skin: Stage II  sacral/buttocks pressure injury  psychiatry: Mood appropriate    Data Reviewed: I have personally reviewed following labs and imaging studies  CBC: Recent Labs  Lab 06/05/20 1600 06/06/20 0052 06/07/20 0122 06/08/20 0257  WBC 17.1* 14.2* 9.3 5.9  NEUTROABS  --  13.5*  --   --   HGB 9.2* 7.8* 7.4* 7.3*  HCT 29.4* 24.4* 23.0* 22.9*  MCV 97.7 96.1 95.0 95.4  PLT 272 209 213 185    Basic Metabolic Panel: Recent Labs  Lab 06/05/20 1600 06/06/20 0052 06/07/20 0122 06/08/20 0257 06/09/20 0325  NA 130* 129* 127* 127* 128*  K 3.6 3.6 3.3* 4.2 4.1  CL 87* 90* 88* 89* 88*  CO2 31 33* 33* 31 32  GLUCOSE 138* 125* 107* 92 87  BUN 21 22 25* 21 17  CREATININE 1.32* 1.17 1.12 1.01 0.95  CALCIUM 9.2 8.7* 8.6* 8.7* 8.9  MG 2.0 2.0  --   --   --     GFR: Estimated Creatinine Clearance: 45.5 mL/min (by C-G formula based on SCr of 0.95 mg/dL).  Liver Function Tests: Recent Labs  Lab 06/05/20 2000 06/06/20 0052  AST 29 25  ALT 22 18  ALKPHOS 69 59  BILITOT 0.5 0.8  PROT 5.4* 4.7*  ALBUMIN 3.3* 2.9*  CBG: No results for input(s): GLUCAP in the last 168 hours.   Recent Results (from the past 240 hour(s))  SARS CORONAVIRUS 2 (TAT 6-24 HRS) Nasopharyngeal Nasopharyngeal Swab     Status: None   Collection Time: 06/05/20  4:36 PM   Specimen: Nasopharyngeal Swab  Result Value Ref Range Status   SARS Coronavirus 2 NEGATIVE NEGATIVE Final    Comment: (NOTE) SARS-CoV-2 target nucleic acids are NOT DETECTED.  The SARS-CoV-2 RNA is generally detectable in upper and lower respiratory specimens during the acute phase of infection. Negative results do not preclude SARS-CoV-2 infection, do not rule out co-infections with other pathogens, and should not be used as the sole basis for treatment or other patient management decisions. Negative results must be combined with clinical observations, patient history, and epidemiological information. The expected result is  Negative.  Fact Sheet for Patients: SugarRoll.be  Fact Sheet for Healthcare Providers: https://www.woods-mathews.com/  This test is not yet approved or cleared by the [place] FDA and  has been authorized for detection and/or diagnosis of SARS-CoV-2 by FDA under an Emergency Use Authorization (EUA). This EUA will remain  in effect (meaning this test can be used) for the duration of the COVID-19 declaration under Se ction 564(b)(1) of the Act, 21 U.S.C. section 360bbb-3(b)(1), unless the authorization is terminated or revoked sooner.  Performed at [facility], [place] [address] [place], [place] [address]   Culture, blood (Routine X 2) w Reflex to ID Panel     Status: None (Preliminary result)   Collection Time: 06/06/20 12:38 AM   Specimen: BLOOD RIGHT ARM  Result Value Ref Range Status   Specimen Description BLOOD RIGHT ARM  Final   Special Requests   Final    BOTTLES DRAWN AEROBIC ONLY Blood Culture adequate volume   Culture   Final    NO GROWTH 3 DAYS Performed at [facility], [address] [place], [place] [address]    Report Status PENDING  Incomplete  Culture, blood (Routine X 2) w Reflex to ID Panel     Status: None (Preliminary result)   Collection Time: 06/06/20 12:53 AM   Specimen: BLOOD LEFT HAND  Result Value Ref Range Status   Specimen Description BLOOD LEFT HAND  Final   Special Requests   Final    BOTTLES DRAWN AEROBIC AND ANAEROBIC Blood Culture adequate volume   Culture   Final    NO GROWTH 3 DAYS Performed at [facility], [place] [address], [place], [place] [address]    Report Status PENDING  Incomplete         Radiology Studies: No results found.      Scheduled Meds: . acetaminophen  500 mg Oral TID  . aspirin EC  81 mg Oral Daily  . atorvastatin  40 mg Oral QHS  . budesonide-formoterol  2 puff Inhalation BID  . calcium carbonate  2 tablet Oral TID WC  . Chlorhexidine  Gluconate Cloth  6 each Topical Q0600  . famotidine  20 mg Oral Daily  . finasteride  5 mg Oral Q lunch  . FLUoxetine  10 mg Oral QHS  . fluticasone  1-2 spray Each Nare Daily  . furosemide  20 mg Oral Once  . furosemide  40 mg Oral Daily  . guaiFENesin  600 mg Oral BID  . metoprolol tartrate  12.5 mg Oral TID  . pantoprazole  40 mg Oral BID  . polyvinyl alcohol  1 drop Both Eyes  TID  . predniSONE  10 mg Oral Daily  . sodium chloride  1 drop Both Eyes QID  . Tiotropium Bromide Monohydrate  2 puff Inhalation Q0600  . vitamin B-12  1,000 mcg Oral Daily   Continuous Infusions:   LOS: 4 days        Hosie Poisson, MD [facility]   To contact the attending provider between 7A-7P or the covering provider during after hours 7P-7A, please log into the web site [email or web address] and access using universal [place] password for that web site. If you do not have the password, please call the hospital operator.  06/09/2020, 2:40 PM

## 2020-06-09 NOTE — TOC Progression Note (Addendum)
Transition of Care [facility]) - Progression Note    Patient Details  Name: [name] MRN: [id number] Date of Birth: 1929-06-29  Transition of Care [facility]) CM/SW [place], [place] Phone Number: 06/09/2020, 3:49 PM  Clinical Narrative:     CSW received confirmation text from [facility] stating they can accept pt tomorrow. They are contracted with [facility] so pt can maintain palliative care with them. CSW notified pt spouse by phone. Covid test requested.   Expected Discharge Plan: [place] Barriers to Discharge: Continued Medical Work up  Expected Discharge Plan and Services Expected Discharge Plan: [place] In-house Referral: Clinical Social Work     Living arrangements for the past 2 months: Single Family Home                                       Social Determinants of Health (SDOH) Interventions    Readmission Risk Interventions No flowsheet data found.

## 2020-06-09 NOTE — Progress Notes (Signed)
Mobility Specialist: Progress Note   06/09/20 1544  Mobility  Activity Transferred:  Bed to chair  Level of Assistance Standby assist, set-up cues, supervision of patient - no hands on  Assistive Device None  Mobility Out of bed to chair with meals  Mobility Response Tolerated well  Mobility performed by Mobility specialist  Bed Position Chair  $Mobility charge 1 Mobility   Pre-Mobility: 68 HR, 100% SpO2 Post-Mobility: 73 HR, 96% SpO2  Pt was independent to transfer from supine to sitting EOB and standby assist during transfer from bed to chair using post op shoe. Pt is in the chair with family member present in room.   [facility] [name] Mobility Specialist Mobility Specialist Phone: [phone]

## 2020-06-10 DIAGNOSIS — E861 Hypovolemia: Secondary | ICD-10-CM | POA: Diagnosis present

## 2020-06-10 DIAGNOSIS — J9611 Chronic respiratory failure with hypoxia: Secondary | ICD-10-CM | POA: Diagnosis not present

## 2020-06-10 DIAGNOSIS — Z20822 Contact with and (suspected) exposure to covid-19: Secondary | ICD-10-CM | POA: Diagnosis present

## 2020-06-10 DIAGNOSIS — E873 Alkalosis: Secondary | ICD-10-CM | POA: Diagnosis present

## 2020-06-10 DIAGNOSIS — F29 Unspecified psychosis not due to a substance or known physiological condition: Secondary | ICD-10-CM | POA: Diagnosis not present

## 2020-06-10 DIAGNOSIS — L89152 Pressure ulcer of sacral region, stage 2: Secondary | ICD-10-CM | POA: Diagnosis not present

## 2020-06-10 DIAGNOSIS — I469 Cardiac arrest, cause unspecified: Secondary | ICD-10-CM | POA: Diagnosis not present

## 2020-06-10 DIAGNOSIS — J449 Chronic obstructive pulmonary disease, unspecified: Secondary | ICD-10-CM | POA: Diagnosis present

## 2020-06-10 DIAGNOSIS — R0602 Shortness of breath: Secondary | ICD-10-CM | POA: Diagnosis not present

## 2020-06-10 DIAGNOSIS — I5032 Chronic diastolic (congestive) heart failure: Secondary | ICD-10-CM | POA: Diagnosis not present

## 2020-06-10 DIAGNOSIS — R69 Illness, unspecified: Secondary | ICD-10-CM | POA: Diagnosis not present

## 2020-06-10 DIAGNOSIS — Z515 Encounter for palliative care: Secondary | ICD-10-CM | POA: Diagnosis not present

## 2020-06-10 DIAGNOSIS — F411 Generalized anxiety disorder: Secondary | ICD-10-CM | POA: Diagnosis present

## 2020-06-10 DIAGNOSIS — E559 Vitamin D deficiency, unspecified: Secondary | ICD-10-CM | POA: Diagnosis present

## 2020-06-10 DIAGNOSIS — I219 Acute myocardial infarction, unspecified: Secondary | ICD-10-CM | POA: Diagnosis not present

## 2020-06-10 DIAGNOSIS — N400 Enlarged prostate without lower urinary tract symptoms: Secondary | ICD-10-CM | POA: Diagnosis present

## 2020-06-10 DIAGNOSIS — I480 Paroxysmal atrial fibrillation: Secondary | ICD-10-CM | POA: Diagnosis present

## 2020-06-10 DIAGNOSIS — R278 Other lack of coordination: Secondary | ICD-10-CM | POA: Diagnosis not present

## 2020-06-10 DIAGNOSIS — I5041 Acute combined systolic (congestive) and diastolic (congestive) heart failure: Secondary | ICD-10-CM | POA: Diagnosis not present

## 2020-06-10 DIAGNOSIS — J9612 Chronic respiratory failure with hypercapnia: Secondary | ICD-10-CM | POA: Diagnosis not present

## 2020-06-10 DIAGNOSIS — Z9981 Dependence on supplemental oxygen: Secondary | ICD-10-CM | POA: Diagnosis not present

## 2020-06-10 DIAGNOSIS — R079 Chest pain, unspecified: Secondary | ICD-10-CM | POA: Diagnosis not present

## 2020-06-10 DIAGNOSIS — Z8249 Family history of ischemic heart disease and other diseases of the circulatory system: Secondary | ICD-10-CM | POA: Diagnosis not present

## 2020-06-10 DIAGNOSIS — I739 Peripheral vascular disease, unspecified: Secondary | ICD-10-CM | POA: Diagnosis present

## 2020-06-10 DIAGNOSIS — I110 Hypertensive heart disease with heart failure: Secondary | ICD-10-CM | POA: Diagnosis present

## 2020-06-10 DIAGNOSIS — Z66 Do not resuscitate: Secondary | ICD-10-CM | POA: Diagnosis present

## 2020-06-10 DIAGNOSIS — E039 Hypothyroidism, unspecified: Secondary | ICD-10-CM | POA: Diagnosis present

## 2020-06-10 DIAGNOSIS — M6281 Muscle weakness (generalized): Secondary | ICD-10-CM | POA: Diagnosis not present

## 2020-06-10 DIAGNOSIS — I499 Cardiac arrhythmia, unspecified: Secondary | ICD-10-CM | POA: Diagnosis not present

## 2020-06-10 DIAGNOSIS — I222 Subsequent non-ST elevation (NSTEMI) myocardial infarction: Secondary | ICD-10-CM | POA: Diagnosis present

## 2020-06-10 DIAGNOSIS — F32A Depression, unspecified: Secondary | ICD-10-CM | POA: Diagnosis present

## 2020-06-10 DIAGNOSIS — E871 Hypo-osmolality and hyponatremia: Secondary | ICD-10-CM | POA: Diagnosis present

## 2020-06-10 DIAGNOSIS — I5033 Acute on chronic diastolic (congestive) heart failure: Secondary | ICD-10-CM | POA: Diagnosis present

## 2020-06-10 DIAGNOSIS — R0789 Other chest pain: Secondary | ICD-10-CM | POA: Diagnosis not present

## 2020-06-10 DIAGNOSIS — Z823 Family history of stroke: Secondary | ICD-10-CM | POA: Diagnosis not present

## 2020-06-10 DIAGNOSIS — Z89611 Acquired absence of right leg above knee: Secondary | ICD-10-CM | POA: Diagnosis not present

## 2020-06-10 DIAGNOSIS — Z7189 Other specified counseling: Secondary | ICD-10-CM | POA: Diagnosis not present

## 2020-06-10 DIAGNOSIS — R2681 Unsteadiness on feet: Secondary | ICD-10-CM | POA: Diagnosis not present

## 2020-06-10 DIAGNOSIS — F419 Anxiety disorder, unspecified: Secondary | ICD-10-CM | POA: Diagnosis not present

## 2020-06-10 DIAGNOSIS — E43 Unspecified severe protein-calorie malnutrition: Secondary | ICD-10-CM | POA: Diagnosis present

## 2020-06-10 DIAGNOSIS — J9621 Acute and chronic respiratory failure with hypoxia: Secondary | ICD-10-CM | POA: Diagnosis present

## 2020-06-10 DIAGNOSIS — I214 Non-ST elevation (NSTEMI) myocardial infarction: Secondary | ICD-10-CM | POA: Diagnosis present

## 2020-06-10 DIAGNOSIS — D509 Iron deficiency anemia, unspecified: Secondary | ICD-10-CM | POA: Diagnosis present

## 2020-06-10 LAB — SARS CORONAVIRUS 2 (TAT 6-24 HRS): SARS Coronavirus 2: NEGATIVE

## 2020-06-10 MED ORDER — ALPRAZOLAM 0.5 MG PO TABS
0.5000 mg | ORAL_TABLET | Freq: Three times a day (TID) | ORAL | 0 refills | Status: AC | PRN
Start: 2020-06-10 — End: ?

## 2020-06-10 MED ORDER — METOPROLOL TARTRATE 25 MG PO TABS: 12.5000 mg | ORAL_TABLET | Freq: Three times a day (TID) | ORAL | 0 refills | Status: AC

## 2020-06-10 MED ORDER — ATORVASTATIN CALCIUM 40 MG PO TABS: 40.0000 mg | ORAL_TABLET | Freq: Every day | ORAL | 0 refills | Status: AC

## 2020-06-10 MED ORDER — OXYCODONE HCL 5 MG PO TABS: 2.5000 mg | ORAL_TABLET | Freq: Four times a day (QID) | ORAL | 0 refills | Status: AC | PRN

## 2020-06-10 NOTE — Care Management (Signed)
06-10-20 Case Manager reached out to [place] regarding consult for Hospice. [place] reached out to the family and the family is not ready for hospice services at this time. Patient plan for SNF. Bethena Roys, RN,BSN Case Manager

## 2020-06-10 NOTE — Progress Notes (Signed)
Physician Discharge Summary  [name] QQI:[id number] DOB: March 10, 1929 DOA: 06/05/2020  PCP: [name], MD  Admit date: 06/05/2020 Discharge date: 06/10/2020  Admitted From: Home  Disposition: SNF  Recommendations for Outpatient Follow-up:  1. Follow up with PCP in 1-2 weeks 2. Please obtain BMP/CBC in one week Please follow up with cardiology as recommended.  Please follow up with palliative care as outpatient.   Discharge Condition: guarded.  CODE STATUS: partial code.  Diet recommendation: Heart Healthy  Brief/Interim Summary:   87 year old gentleman with Prior history of coronary artery disease s/p CABG, chronic diastolic heart failure, chronic respiratory failure with hypoxia requiring about 2 L of nasal cannula oxygen, severe COPD on chronic prednisone therapy, peripheral vascular disease s/p AKA, paroxysmal atrial fibrillation, anemia, chronic Foley catheter placement presents to ED with chest pain.  He was admitted for the further evaluation of NSTEMI.  In view of his extensive cardiac history, marked anemia, poor candidate for anticoagulation or dual antiplatelet agents.  Therapeutic options are limited and cardiology recommended palliative care consult.  Discharge Diagnoses:  Principal Problem:   [place] myocardial infarction [facility]) Active Problems:   Hyperlipidemia   COPD   GOLD IV/ 02/steroid dep    Chronic respiratory failure with hypoxia and hypercapnia (HCC)   Elevated troponin   Chronic diastolic CHF (congestive heart failure) (HCC)   Hypotension   Atrial fibrillation ([place])  NSTEMI Patient came in with chest pain with elevated troponins, EKG with normal sinus rhythm.  Cardiology consulted recommended medical management at this time, in view of his multiple comorbidities, marked anemia poor candidate for dual platelet agents, recommended palliative care approach.  Continue with aspirin and statin.    History of coronary artery disease s/p  CABG Intermittent atypical chest pain.  As needed nitroglycerin ordered.    History of chronic respiratory failure with hypoxia, oxygen dependent in the setting of stage IV COPD and steroid-dependent No evidence of acute exacerbation at this time no wheezing heard on exam Continuous bronchodilators as needed and prednisone.   Chronic diastolic heart failure Last echocardiogram showed preserved left ventricular ejection fraction with no regional wall abnormalities Continue with Lasix 40 mg BID.  and strict intake and output and daily weights.  Appreciate cardiology recommendations.    Paroxysmal atrial fibrillation .  Rate better controlled With metoprolol    Stage II sacral decubitus ulcer present on admission left medial lower extremity wound present on admission Pressure Injury 01/24/20 Coccyx Mid;Lower Stage 2 -  Partial thickness loss of dermis presenting as a shallow open injury with a red, pink wound bed without slough. (Active)  01/24/20 1445  Location: Coccyx  Location Orientation: Mid;Lower  Staging: Stage 2 -  Partial thickness loss of dermis presenting as a shallow open injury with a red, pink wound bed without slough.  Wound Description (Comments):   Present on Admission: Yes     Pressure Injury 02/06/20 Buttocks Left Stage 2 -  Partial thickness loss of dermis presenting as a shallow open injury with a red, pink wound bed without slough. Above previous wound (Active)  02/06/20 (noted by [facility] nurse on consult) 1459  Location: Buttocks  Location Orientation: Left  Staging: Stage 2 -  Partial thickness loss of dermis presenting as a shallow open injury with a red, pink wound bed without slough.  Wound Description (Comments): Above previous wound  Present on Admission: Yes     Pressure Injury 04/08/20 Buttocks Medial Stage 3 -  Full thickness tissue loss.  Subcutaneous fat may be visible but bone, tendon or muscle are NOT exposed. (Active)  04/08/20 1500   Location: Buttocks  Location Orientation: Medial  Staging: Stage 3 -  Full thickness tissue loss. Subcutaneous fat may be visible but bone, tendon or muscle are NOT exposed.  Wound Description (Comments):   Present on Admission: Yes    Continue with local wound care.    Chronic hyponatremia Sodium at 128.  Probably secondary to diuresis    In view of his multiple medical problems, significant comorbidities poor prognosis deconditioning and debility, end-stage COPD, coronary artery disease, peripheral vascular disease s/p right AKA with a chronic left leg wound, chronic sacral decubitus ulcer admitted for NSTEMI palliative care consulted and they wish to go to SNF with palliative services following the patient.    Discharge Instructions  Discharge Instructions    Diet - low sodium heart healthy   Complete by: As directed    Discharge instructions   Complete by: As directed    Please follow up with cardiology as recommended.  Please follow upw ith outpatient palliative care.   Discharge wound care:   Complete by: As directed    Cleanse the wound on the LLE and the coccyx with soap and water, pat dry. For the LLE, PLACE A PIECE OF Aquacel Kellie Simmering [phone]) INTO the wound, then cover with kerlex. Do NOT wrap an ace wrap around the leg. For the Coccyx, place a small piece of Aquacel over the coccyx wound, then a foam dressing. Perform daily.  06/07/20 0913   Increase activity slowly   Complete by: As directed      Allergies as of 06/10/2020      Reactions   Cefdinir Diarrhea, Other (See Comments)   Severe Diarrhea   Nitrofurantoin Swelling, Other (See Comments)   Hand Swelling   Sulfa Antibiotics Anaphylaxis, Swelling   Sulfonamide Derivatives Swelling, Other (See Comments)   Facial/tongue swelling   Tape Other (See Comments)   SKIN IS VERY THIN AND TEARS EASILY!!!!! Please do NOT use "plastic" tape- USE PAPER!!   Ciprofloxacin Itching, Rash, Other (See Comments)    Red itchy hands   Nitrofurantoin Swelling, Other (See Comments)   Swollen hands   Amoxicillin Er Other (See Comments)   Frequest urination. Pt reports "not really" allergic 01/24/20   Doxycycline Other (See Comments)   "Felt terrible" Pt wife reports "not really" allergic 01/24/20   Keflex [cephalexin] Other (See Comments)   Pt does not recall reaction    Mirtazapine Other (See Comments)   Pt does not recall reaction   Ranolazine Other (See Comments)   Inability to ambulate    Chlorhexidine Gluconate Rash   Levaquin [levofloxacin] Itching, Rash   Sertraline Anxiety, Other (See Comments)   Makes the patient jittery      Medication List    STOP taking these medications   alfuzosin 10 MG 24 hr tablet Commonly known as: UROXATRAL   FINASTERIDE PO   HYDROcodone-acetaminophen 5-325 MG tablet Commonly known as: NORCO/VICODIN   mometasone 50 MCG/ACT nasal spray Commonly known as: NASONEX   TOPROL XL PO     TAKE these medications   acetaminophen 500 MG tablet Commonly known as: TYLENOL Take 500 mg by mouth every 6 (six) hours as needed for moderate pain.   ALPRAZolam 0.5 MG tablet Commonly known as: XANAX Take 1 tablet (0.5 mg total) by mouth 3 (three) times daily as needed for anxiety.   aspirin 81 MG tablet Take 81 mg by  mouth daily.   atorvastatin 40 MG tablet Commonly known as: LIPITOR Take 1 tablet (40 mg total) by mouth at bedtime. What changed:   medication strength  how much to take   calcium carbonate 500 MG chewable tablet Commonly known as: TUMS - dosed in mg elemental calcium Chew 1 tablet (200 mg of elemental calcium total) by mouth 2 (two) times daily as needed for indigestion or heartburn.   COLLAGEN MATRIX-SILVER EX Apply 1 tablet topically every other day.   famotidine 20 MG tablet Commonly known as: PEPCID Take 20 mg by mouth at bedtime as needed for heartburn or indigestion.   finasteride 5 MG tablet Commonly known as: PROSCAR Take 1  tablet (5 mg total) by mouth daily with lunch.   FLUoxetine 10 MG capsule Commonly known as: PROZAC Take 10 mg by mouth at bedtime.   fluticasone 50 MCG/ACT nasal spray Commonly known as: FLONASE Place 1-2 sprays into both nostrils daily.   furosemide 20 MG tablet Commonly known as: LASIX Take 40-80 mg by mouth See admin instructions. In 20mg  tablets, take 40mg  daily and 80mg  PRN What changed: Another medication with the same name was removed. Continue taking this medication, and follow the directions you see here.   guaiFENesin 600 MG 12 hr tablet Commonly known as: MUCINEX Take 1 tablet (600 mg total) by mouth 2 (two) times daily.   levalbuterol 0.63 MG/3ML nebulizer solution Commonly known as: XOPENEX Take 3 mLs (0.63 mg total) by nebulization every 4 (four) hours as needed for wheezing or shortness of breath.   levalbuterol 45 MCG/ACT inhaler Commonly known as: Xopenex HFA Inhale 2 puffs into the lungs every 4 (four) hours as needed for wheezing or shortness of breath.   metoprolol tartrate 25 MG tablet Commonly known as: LOPRESSOR Take 0.5 tablets (12.5 mg total) by mouth 3 (three) times daily.   Nitrostat 0.4 MG SL tablet Generic drug: nitroGLYCERIN Place 0.4 mg under the tongue every 5 (five) minutes as needed for chest pain.   oxyCODONE 5 MG immediate release tablet Commonly known as: Oxy IR/ROXICODONE Take 0.5 tablets (2.5 mg total) by mouth every 6 (six) hours as needed for breakthrough pain.   OXYGEN Inhale 2 L/min into the lungs continuous.   pantoprazole 40 MG tablet Commonly known as: PROTONIX TAKE 1 TABLET(40 MG) BY MOUTH DAILY 30 TO 60 MINUTES BEFORE FIRST MEAL OF THE DAY   polyethylene glycol 17 g packet Commonly known as: MIRALAX / GLYCOLAX Take 17 g by mouth daily. What changed:   when to take this  reasons to take this   predniSONE 10 MG tablet Commonly known as: DELTASONE Take 1 tablet (10 mg total) by mouth daily.   PreserVision AREDS 2  Caps Take 1 capsule by mouth daily.   sodium chloride 5 % ophthalmic solution Commonly known as: MURO 128 Place 1 drop into both eyes 4 (four) times daily.   Spiriva Respimat 2.5 MCG/ACT Aers Generic drug: Tiotropium Bromide Monohydrate INHALE 2 PUFFS BY MOUTH EVERY DAY What changed: See the new instructions.   SWEEN EX Apply 1 application topically daily.   Symbicort 160-4.5 MCG/ACT inhaler Generic drug: budesonide-formoterol Inhale 2 puffs into the lungs 2 (two) times daily.   Systane 0.4-0.3 % Soln Generic drug: Polyethyl Glycol-Propyl Glycol Apply 1 drop to eye 2 (two) times daily as needed (eye irritation). What changed: when to take this   vitamin B-12 1000 MCG tablet Commonly known as: CYANOCOBALAMIN Take 1 tablet (1,000 mcg total) by mouth  daily.   Vitamin D3 50 MCG (2000 UT) Tabs Take 2,000 Units by mouth daily with lunch.            Discharge Care Instructions  (From admission, onward)         Start     Ordered   06/10/20 0000  Discharge wound care:       Comments: Cleanse the wound on the LLE and the coccyx with soap and water, pat dry. For the LLE, PLACE A PIECE OF Aquacel Kellie Simmering [phone]) INTO the wound, then cover with kerlex. Do NOT wrap an ace wrap around the leg. For the Coccyx, place a small piece of Aquacel over the coccyx wound, then a foam dressing. Perform daily.  06/07/20 0913   06/10/20 0940          Follow-up Information    Belva Crome, MD Follow up on 07/01/2020.   Specialty: Cardiology Why: at 11:15 Am with his nurse practitioner Urban Gibson. Contact information: [address] [place] [address] [phone]        [name], MD. Schedule an appointment as soon as possible for a visit in 1 week(s).   Specialty: Family Medicine Contact information: [place] [address] [phone]              Allergies  Allergen Reactions  . Cefdinir Diarrhea and Other (See  Comments)    Severe Diarrhea  . Nitrofurantoin Swelling and Other (See Comments)    Hand Swelling  . Sulfa Antibiotics Anaphylaxis and Swelling  . Sulfonamide Derivatives Swelling and Other (See Comments)    Facial/tongue swelling  . Tape Other (See Comments)    SKIN IS VERY THIN AND TEARS EASILY!!!!! Please do NOT use "plastic" tape- USE PAPER!!  . Ciprofloxacin Itching, Rash and Other (See Comments)    Red itchy hands  . Nitrofurantoin Swelling and Other (See Comments)    Swollen hands  . Amoxicillin Er Other (See Comments)    Frequest urination. Pt reports "not really" allergic 01/24/20  . Doxycycline Other (See Comments)    "Felt terrible" Pt wife reports "not really" allergic 01/24/20  . Keflex [Cephalexin] Other (See Comments)    Pt does not recall reaction   . Mirtazapine Other (See Comments)    Pt does not recall reaction  . Ranolazine Other (See Comments)    Inability to ambulate   . Chlorhexidine Gluconate Rash  . Levaquin [Levofloxacin] Itching and Rash  . Sertraline Anxiety and Other (See Comments)    Makes the patient jittery    Consultations: Cardiology.   Procedures/Studies: DG Chest 2 View  Result Date: 06/05/2020 CLINICAL DATA:  Chest pain. EXAM: CHEST - 2 VIEW COMPARISON:  02/08/2020 and older studies. FINDINGS: Stable changes from prior CABG surgery. Cardiac silhouette is normal in size and configuration. No mediastinal or hilar masses. No adenopathy. Lungs are hyperexpanded. Mild interstitial prominence most evident at the bases. Lungs otherwise clear. No pleural effusion or pneumothorax. Skeletal structures are demineralized, but intact. IMPRESSION: 1. No acute cardiopulmonary disease. 2. COPD. Electronically Signed   By: Lajean Manes M.D.   On: 06/05/2020 17:29   ECHOCARDIOGRAM COMPLETE  Result Date: 06/06/2020    ECHOCARDIOGRAM REPORT   Patient Name:   [name] [facility] Date of Exam: 06/06/2020 Medical Rec #:  [id number]    Height:       69.0 in Accession #:     [id number]   Weight:  121.9 lb Date of Birth:  1929-01-28    BSA:          1.674 m Patient Age:    89 years     BP:           100/65 mmHg Patient Gender: M            HR:           69 bpm. Exam Location:  Inpatient Procedure: 2D Echo, Cardiac Doppler and Color Doppler Indications:    R07.9* Chest pain, unspecified  History:        Patient has prior history of Echocardiogram examinations, most                 recent 01/28/2020. CHF, Previous Myocardial Infarction and CAD,                 Abnormal ECG, COPD, Arrythmias:Atrial Fibrillation and Atrial                 Flutter, Signs/Symptoms:Shortness of Breath and Dyspnea; Risk                 Factors:Hypertension and Dyslipidemia. Cor pulmonale.  Sonographer:    Roseanna Rainbow RDCS Referring Phys: [id number] Rhetta Mura  Sonographer Comments: Technically difficult study due to poor echo windows, suboptimal parasternal window and suboptimal apical window. Extremely difficult windows. IMPRESSIONS  1. Left ventricular ejection fraction, by estimation, is 60 to 65%. The left ventricle has normal function. The left ventricle has no regional wall motion abnormalities. Left ventricular diastolic parameters were normal.  2. Right ventricular systolic function is low normal. The right ventricular size is mildly enlarged. There is moderately elevated pulmonary artery systolic pressure.  3. The mitral valve is degenerative. Mild mitral valve regurgitation. Moderate mitral annular calcification.  4. Tricuspid valve regurgitation is moderate.  5. The aortic valve is grossly normal. There is mild calcification of the aortic valve. There is mild thickening of the aortic valve. Aortic valve regurgitation is not visualized. Mild aortic valve sclerosis is present, with no evidence of aortic valve stenosis. Comparison(s): No significant change from prior study. FINDINGS  Left Ventricle: Left ventricular ejection fraction, by estimation, is 60 to 65%. The left ventricle has normal  function. The left ventricle has no regional wall motion abnormalities. The left ventricular internal cavity size was normal in size. There is  no left ventricular hypertrophy. Abnormal (paradoxical) septal motion consistent with post-operative status. Left ventricular diastolic parameters were normal. Right Ventricle: The right ventricular size is mildly enlarged. Right vetricular wall thickness was not well visualized. Right ventricular systolic function is low normal. There is moderately elevated pulmonary artery systolic pressure. The tricuspid regurgitant velocity is 3.01 m/s, and with an assumed right atrial pressure of 15 mmHg, the estimated right ventricular systolic pressure is 29.9 mmHg. Left Atrium: Left atrial size was normal in size. Right Atrium: Right atrial size was normal in size. Pericardium: Trivial pericardial effusion is present. Mitral Valve: The mitral valve is degenerative in appearance. There is mild thickening of the mitral valve leaflet(s). There is mild calcification of the mitral valve leaflet(s). Moderate mitral annular calcification. Mild mitral valve regurgitation. Tricuspid Valve: The tricuspid valve is normal in structure. Tricuspid valve regurgitation is moderate. Aortic Valve: The aortic valve is grossly normal. There is mild calcification of the aortic valve. There is mild thickening of the aortic valve. Aortic valve regurgitation is not visualized. Mild aortic valve sclerosis is present, with no evidence of aortic valve  stenosis. Pulmonic Valve: The pulmonic valve was not well visualized. Pulmonic valve regurgitation is not visualized. Aorta: The aortic root, ascending aorta and aortic arch are all structurally normal, with no evidence of dilitation or obstruction. IAS/Shunts: The atrial septum is grossly normal.  LEFT VENTRICLE PLAX 2D LVIDd:         3.60 cm     Diastology LVIDs:         2.50 cm     LV e' medial:    7.65 cm/s LV PW:         1.10 cm     LV E/e' medial:  13.5 LV  IVS:        1.10 cm     LV e' lateral:   8.93 cm/s LVOT diam:     2.30 cm     LV E/e' lateral: 11.5 LV SV:         57 LV SV Index:   34 LVOT Area:     4.15 cm  LV Volumes (MOD) LV vol d, MOD A2C: 36.7 ml LV vol d, MOD A4C: 64.4 ml LV vol s, MOD A2C: 18.9 ml LV vol s, MOD A4C: 24.3 ml LV SV MOD A2C:     17.8 ml LV SV MOD A4C:     64.4 ml LV SV MOD BP:      28.4 ml RIGHT VENTRICLE RV S prime:     7.73 cm/s TAPSE (M-mode): 1.4 cm LEFT ATRIUM           Index       RIGHT ATRIUM           Index LA diam:      3.40 cm 2.03 cm/m  RA Area:     13.10 cm LA Vol (A2C): 19.1 ml 11.41 ml/m RA Volume:   33.90 ml  20.25 ml/m LA Vol (A4C): 19.9 ml 11.89 ml/m  AORTIC VALVE LVOT Vmax:   70.30 cm/s LVOT Vmean:  44.100 cm/s LVOT VTI:    0.138 m  AORTA Ao Root diam: 3.70 cm Ao Asc diam:  3.50 cm MITRAL VALVE                TRICUSPID VALVE MV Area (PHT): 5.27 cm     TR Peak grad:   36.2 mmHg MV Decel Time: 144 msec     TR Vmax:        301.00 cm/s MV E velocity: 103.00 cm/s MV A velocity: 64.00 cm/s   SHUNTS MV E/A ratio:  1.61         Systemic VTI:  0.14 m                             Systemic Diam: 2.30 cm Buford Dresser MD Electronically signed by Buford Dresser MD Signature Date/Time: 06/06/2020/10:57:09 AM    Final        Subjective: No stuffiness. No chest pain or sob.  Discharge Exam: Vitals:   06/10/20 0741 06/10/20 0823  BP: 132/66   Pulse: 70   Resp: 14   Temp: 98.9 F (37.2 C)   SpO2: 96% 99%   Vitals:   06/10/20 0017 06/10/20 0511 06/10/20 0741 06/10/20 0823  BP: 124/74 (!) 113/59 132/66   Pulse: 65 62 70   Resp: 13 16 14    Temp: 99.5 F (37.5 C) 99.5 F (37.5 C) 98.9 F (37.2 C)   TempSrc: Oral Oral Oral   SpO2: 97% 96% 96%  99%  Weight:  57.5 kg      General: Pt is alert, awake, not in acute distress Cardiovascular: RRR, S1/S2 +, no rubs, no gallops Respiratory: CTA bilaterally, no wheezing, no rhonchi Abdominal: Soft, NT, ND, bowel sounds + Extremities: no edema, no  cyanosis    The results of significant diagnostics from this hospitalization (including imaging, microbiology, ancillary and laboratory) are listed below for reference.     Microbiology: Recent Results (from the past 240 hour(s))  SARS CORONAVIRUS 2 (TAT 6-24 HRS) Nasopharyngeal Nasopharyngeal Swab     Status: None   Collection Time: 06/05/20  4:36 PM   Specimen: Nasopharyngeal Swab  Result Value Ref Range Status   SARS Coronavirus 2 NEGATIVE NEGATIVE Final    Comment: (NOTE) SARS-CoV-2 target nucleic acids are NOT DETECTED.  The SARS-CoV-2 RNA is generally detectable in upper and lower respiratory specimens during the acute phase of infection. Negative results do not preclude SARS-CoV-2 infection, do not rule out co-infections with other pathogens, and should not be used as the sole basis for treatment or other patient management decisions. Negative results must be combined with clinical observations, patient history, and epidemiological information. The expected result is Negative.  Fact Sheet for Patients: SugarRoll.be  Fact Sheet for Healthcare Providers: https://www.woods-mathews.com/  This test is not yet approved or cleared by the [place] FDA and  has been authorized for detection and/or diagnosis of SARS-CoV-2 by FDA under an Emergency Use Authorization (EUA). This EUA will remain  in effect (meaning this test can be used) for the duration of the COVID-19 declaration under Se ction 564(b)(1) of the Act, 21 U.S.C. section 360bbb-3(b)(1), unless the authorization is terminated or revoked sooner.  Performed at [facility], [place] [address], [place], [place] [address]   Culture, blood (Routine X 2) w Reflex to ID Panel     Status: None (Preliminary result)   Collection Time: 06/06/20 12:38 AM   Specimen: BLOOD RIGHT ARM  Result Value Ref Range Status   Specimen Description BLOOD RIGHT ARM  Final   Special Requests    Final    BOTTLES DRAWN AEROBIC ONLY Blood Culture adequate volume   Culture   Final    NO GROWTH 4 DAYS Performed at [facility], [address] [place], [place] [address]    Report Status PENDING  Incomplete  Culture, blood (Routine X 2) w Reflex to ID Panel     Status: None (Preliminary result)   Collection Time: 06/06/20 12:53 AM   Specimen: BLOOD LEFT HAND  Result Value Ref Range Status   Specimen Description BLOOD LEFT HAND  Final   Special Requests   Final    BOTTLES DRAWN AEROBIC AND ANAEROBIC Blood Culture adequate volume   Culture   Final    NO GROWTH 4 DAYS Performed at [facility], [place] [address], [place], [place] [address]    Report Status PENDING  Incomplete  SARS CORONAVIRUS 2 (TAT 6-24 HRS) Nasopharyngeal Nasopharyngeal Swab     Status: None   Collection Time: 06/09/20  4:48 PM   Specimen: Nasopharyngeal Swab  Result Value Ref Range Status   SARS Coronavirus 2 NEGATIVE NEGATIVE Final    Comment: (NOTE) SARS-CoV-2 target nucleic acids are NOT DETECTED.  The SARS-CoV-2 RNA is generally detectable in upper and lower respiratory specimens during the acute phase of infection. Negative results do not preclude SARS-CoV-2 infection, do not rule out co-infections with other pathogens, and should not be used as  the sole basis for treatment or other patient management decisions. Negative results must be combined with clinical observations, patient history, and epidemiological information. The expected result is Negative.  Fact Sheet for Patients: SugarRoll.be  Fact Sheet for Healthcare Providers: https://www.woods-mathews.com/  This test is not yet approved or cleared by the [place] FDA and  has been authorized for detection and/or diagnosis of SARS-CoV-2 by FDA under an Emergency Use Authorization (EUA). This EUA will remain  in effect (meaning this test can be used) for the duration of the COVID-19  declaration under Se ction 564(b)(1) of the Act, 21 U.S.C. section 360bbb-3(b)(1), unless the authorization is terminated or revoked sooner.  Performed at [facility], [place] [address], [place], [place] [address]      Labs: BNP (last 3 results) Recent Labs    02/10/20 0646 06/05/20 1600 06/07/20 1326  BNP 334.1* 473.5* 671.2*   Basic Metabolic Panel: Recent Labs  Lab 06/05/20 1600 06/06/20 0052 06/07/20 0122 06/08/20 0257 06/09/20 0325  NA 130* 129* 127* 127* 128*  K 3.6 3.6 3.3* 4.2 4.1  CL 87* 90* 88* 89* 88*  CO2 31 33* 33* 31 32  GLUCOSE 138* 125* 107* 92 87  BUN 21 22 25* 21 17  CREATININE 1.32* 1.17 1.12 1.01 0.95  CALCIUM 9.2 8.7* 8.6* 8.7* 8.9  MG 2.0 2.0  --   --   --    Liver Function Tests: Recent Labs  Lab 06/05/20 2000 06/06/20 0052  AST 29 25  ALT 22 18  ALKPHOS 69 59  BILITOT 0.5 0.8  PROT 5.4* 4.7*  ALBUMIN 3.3* 2.9*   No results for input(s): LIPASE, AMYLASE in the last 168 hours. No results for input(s): AMMONIA in the last 168 hours. CBC: Recent Labs  Lab 06/05/20 1600 06/06/20 0052 06/07/20 0122 06/08/20 0257  WBC 17.1* 14.2* 9.3 5.9  NEUTROABS  --  13.5*  --   --   HGB 9.2* 7.8* 7.4* 7.3*  HCT 29.4* 24.4* 23.0* 22.9*  MCV 97.7 96.1 95.0 95.4  PLT 272 209 213 215   Cardiac Enzymes: No results for input(s): CKTOTAL, CKMB, CKMBINDEX, TROPONINI in the last 168 hours. BNP: Invalid input(s): POCBNP CBG: No results for input(s): GLUCAP in the last 168 hours. D-Dimer No results for input(s): DDIMER in the last 72 hours. Hgb A1c No results for input(s): HGBA1C in the last 72 hours. Lipid Profile No results for input(s): CHOL, HDL, LDLCALC, TRIG, CHOLHDL, LDLDIRECT in the last 72 hours. Thyroid function studies No results for input(s): TSH, T4TOTAL, T3FREE, THYROIDAB in the last 72 hours.  Invalid input(s): FREET3 Anemia work up Recent Labs    06/07/20 1326  FERRITIN 58  TIBC 301  IRON 18*  RETICCTPCT 1.4    Urinalysis    Component Value Date/Time   COLORURINE YELLOW 06/05/2020 1925   APPEARANCEUR HAZY (A) 06/05/2020 1925   LABSPEC 1.014 06/05/2020 1925   PHURINE 5.0 06/05/2020 1925   GLUCOSEU NEGATIVE 06/05/2020 1925   HGBUR NEGATIVE 06/05/2020 1925   BILIRUBINUR NEGATIVE 06/05/2020 1925   KETONESUR NEGATIVE 06/05/2020 1925   PROTEINUR NEGATIVE 06/05/2020 1925   NITRITE NEGATIVE 06/05/2020 1925   LEUKOCYTESUR LARGE (A) 06/05/2020 1925   Sepsis Labs Invalid input(s): PROCALCITONIN,  WBC,  LACTICIDVEN Microbiology Recent Results (from the past 240 hour(s))  SARS CORONAVIRUS 2 (TAT 6-24 HRS) Nasopharyngeal Nasopharyngeal Swab     Status: None   Collection Time: 06/05/20  4:36 PM   Specimen: Nasopharyngeal Swab  Result Value  Ref Range Status   SARS Coronavirus 2 NEGATIVE NEGATIVE Final    Comment: (NOTE) SARS-CoV-2 target nucleic acids are NOT DETECTED.  The SARS-CoV-2 RNA is generally detectable in upper and lower respiratory specimens during the acute phase of infection. Negative results do not preclude SARS-CoV-2 infection, do not rule out co-infections with other pathogens, and should not be used as the sole basis for treatment or other patient management decisions. Negative results must be combined with clinical observations, patient history, and epidemiological information. The expected result is Negative.  Fact Sheet for Patients: SugarRoll.be  Fact Sheet for Healthcare Providers: https://www.woods-mathews.com/  This test is not yet approved or cleared by the [place] FDA and  has been authorized for detection and/or diagnosis of SARS-CoV-2 by FDA under an Emergency Use Authorization (EUA). This EUA will remain  in effect (meaning this test can be used) for the duration of the COVID-19 declaration under Se ction 564(b)(1) of the Act, 21 U.S.C. section 360bbb-3(b)(1), unless the authorization is terminated or revoked  sooner.  Performed at [facility], [place] [address], [place], [place] [address]   Culture, blood (Routine X 2) w Reflex to ID Panel     Status: None (Preliminary result)   Collection Time: 06/06/20 12:38 AM   Specimen: BLOOD RIGHT ARM  Result Value Ref Range Status   Specimen Description BLOOD RIGHT ARM  Final   Special Requests   Final    BOTTLES DRAWN AEROBIC ONLY Blood Culture adequate volume   Culture   Final    NO GROWTH 4 DAYS Performed at [facility], [address], [place], [place] [address]    Report Status PENDING  Incomplete  Culture, blood (Routine X 2) w Reflex to ID Panel     Status: None (Preliminary result)   Collection Time: 06/06/20 12:53 AM   Specimen: BLOOD LEFT HAND  Result Value Ref Range Status   Specimen Description BLOOD LEFT HAND  Final   Special Requests   Final    BOTTLES DRAWN AEROBIC AND ANAEROBIC Blood Culture adequate volume   Culture   Final    NO GROWTH 4 DAYS Performed at [facility], [place] [address] [place], [place] [address]    Report Status PENDING  Incomplete  SARS CORONAVIRUS 2 (TAT 6-24 HRS) Nasopharyngeal Nasopharyngeal Swab     Status: None   Collection Time: 06/09/20  4:48 PM   Specimen: Nasopharyngeal Swab  Result Value Ref Range Status   SARS Coronavirus 2 NEGATIVE NEGATIVE Final    Comment: (NOTE) SARS-CoV-2 target nucleic acids are NOT DETECTED.  The SARS-CoV-2 RNA is generally detectable in upper and lower respiratory specimens during the acute phase of infection. Negative results do not preclude SARS-CoV-2 infection, do not rule out co-infections with other pathogens, and should not be used as the sole basis for treatment or other patient management decisions. Negative results must be combined with clinical observations, patient history, and epidemiological information. The expected result is Negative.  Fact Sheet for Patients: SugarRoll.be  Fact Sheet for Healthcare  Providers: https://www.woods-mathews.com/  This test is not yet approved or cleared by the [place] FDA and  has been authorized for detection and/or diagnosis of SARS-CoV-2 by FDA under an Emergency Use Authorization (EUA). This EUA will remain  in effect (meaning this test can be used) for the duration of the COVID-19 declaration under Se ction 564(b)(1) of the Act, 21 U.S.C. section 360bbb-3(b)(1), unless the authorization is terminated or revoked sooner.  Performed at [facility], [place] [address] [place], [place] [address]      Time coordinating discharge: 39 minutes.  SIGNED:   Hosie Poisson, MD  [facility] 06/10/2020, 9:40 AM

## 2020-06-10 NOTE — Progress Notes (Signed)
Attempted to give a report to [facility]. Message left for a call back.  Albina Gosney, RN 

## 2020-06-10 NOTE — Discharge Instructions (Addendum)
Heart Attack A heart attack occurs when blood and oxygen supply to the heart is cut off. A heart attack causes damage to the heart that cannot be fixed. A heart attack is also called a myocardial infarction, or MI. If you think you are having a heart attack, do not wait to see if the symptoms will go away. Get medical help right away. What are the causes? This condition may be caused by:  A fatty substance (plaque) in the blood vessels (arteries). This can block the flow of blood to the heart.  A blood clot in the blood vessels that go to the heart. The blood clot blocks blood flow.  Low blood pressure.  An abnormal heartbeat.  Some diseases, such as problems in red blood cells (anemia)orproblems in breathing (respiratory failure).  Tightening (spasm) of a blood vessel that cuts off blood to the heart.  A tear in a blood vessel of the heart.  High blood pressure.   What increases the risk? The following factors may make you more likely to develop this condition:  Aging. The older you are, the higher your risk.  Having a personal or family history of chest pain, heart attack, stroke, or narrowing of the arteries in the legs, arms, head, or stomach (peripheral artery disease).  Being male.  Smoking.  Not getting regular exercise.  Being overweight or obese.  Having high blood pressure.  Having high cholesterol.  Having diabetes.  Drinking too much alcohol.  Using illegal drugs, such as cocaine or methamphetamine. What are the signs or symptoms? Symptoms of this condition include:  Chest pain. It may feel like: ? Crushing or squeezing. ? Tightness, pressure, fullness, or heaviness.  Pain in the arm, neck, jaw, back, or upper body.  Shortness of breath.  Heartburn.  Upset stomach (indigestion).  Feeling like you may vomit (nauseous).  Cold sweats.  Feeling tired.  Sudden light-headedness. How is this treated? A heart attack must be treated as soon as  possible. Treatment may include:  Medicines to: ? Break up or dissolve blood clots. ? Thin blood and help prevent blood clots. ? Treat blood pressure. ? Improve blood flow to the heart. ? Reduce pain. ? Reduce cholesterol.  Procedures to widen a blocked artery and keep it open.  Open heart surgery.  Receiving oxygen.  Making your heart strong again (cardiac rehabilitation) through exercise, education, and counseling.   Follow these instructions at home: Medicines  Take over-the-counter and prescription medicines only as told by your doctor. You may need to take medicine: ? To keep your blood from clotting too easily. ? To control blood pressure. ? To lower cholesterol. ? To control heart rhythms.  Do not take these medicines unless your doctor says it is okay: ? NSAIDs, such as ibuprofen. ? Supplements that have vitamin A, vitamin E, or both. ? Hormone replacement therapy that has estrogen with or without progestin. Lifestyle  Do not use any products that have nicotine or tobacco, such as cigarettes, e-cigarettes, and chewing tobacco. If you need help quitting, ask your doctor.  Avoid secondhand smoke.  Exercise regularly. Ask your doctor about a cardiac rehab program.  Eat heart-healthy foods. Your doctor will tell you what foods to eat.  Stay at a healthy weight.  Lower your stress level.  Do not use illegal drugs.      Alcohol use  Do not drink alcohol if: ? Your doctor tells you not to drink. ? You are pregnant, may be pregnant, or   are planning to become pregnant.  If you drink alcohol: ? Limit how much you use to:  0-1 drink a day for women.  0-2 drinks a day for men. ? Know how much alcohol is in your drink. In the [place] one drink equals one 12 oz bottle of beer (355 mL), one 5 oz glass of wine (148 mL), or one 1 oz glass of hard liquor (44 mL). General instructions  Work with your doctor to treat other problems you may have, such as diabetes or  high blood pressure.  Get screened for depression. Get treatment if needed.  Keep your vaccines up to date. Get the flu shot (influenza vaccine) every year.  Keep all follow-up visits as told by your doctor. This is important. Contact a doctor if:  You feel very sad.  You have trouble doing your daily activities. Get help right away if:  You have sudden, unexplained discomfort in your chest, arms, back, neck, jaw, or upper body.  You have shortness of breath.  You have sudden sweating or clammy skin.  You feel like you may vomit.  You vomit.  You feel tired or weak.  You get light-headed or dizzy.  You feel your heart beating fast.  You feel your heart skipping beats.  You have blood pressure that is higher than 180/120. These symptoms may be an emergency. Do not wait to see if the symptoms will go away. Get medical help right away. Call your local emergency services (911 in the [place] Do not drive yourself to the hospital. Summary  A heart attack occurs when blood and oxygen supply to the heart is cut off.  Do not take NSAIDs unless your doctor says it is okay.  Do not smoke. Avoid secondhand smoke.  Exercise regularly. Ask your doctor about a cardiac rehab program. This information is not intended to replace advice given to you by your health care provider. Make sure you discuss any questions you have with your health care provider. Document Revised: 04/08/2018 Document Reviewed: 04/08/2018 Elsevier Patient Education  2021 [employer].  

## 2020-06-10 NOTE — Care Management Important Message (Signed)
Important Message  Patient Details  Name: AADHAV UHLIG MRN: [id number] Date of Birth: 09-Aug-1929   Medicare Important Message Given:  Yes     [name] 06/10/2020, 11:43 AM

## 2020-06-10 NOTE — Discharge Summary (Addendum)
Physician Discharge Summary  [name] HDQ:[id number] DOB: December 20, 1929 DOA: 06/05/2020  PCP: [name], MD  Admit date: 06/05/2020 Discharge date: 06/10/2020  Admitted From: Home  Disposition: SNF  Recommendations for Outpatient Follow-up:  1. Follow up with PCP in 1-2 weeks 2. Please obtain BMP/CBC in one week Please follow up with cardiology as recommended.  Please follow up with palliative care as outpatient.  Please use Bipap while at the facility and Trilogy at home.   Discharge Condition: guarded.  CODE STATUS: partial code.  Diet recommendation: Heart Healthy  Brief/Interim Summary:   87 year old gentleman with Prior history of coronary artery disease s/p CABG, chronic diastolic heart failure, chronic respiratory failure with hypoxia requiring about 2 L of nasal cannula oxygen, severe COPD on chronic prednisone therapy, peripheral vascular disease s/p AKA, paroxysmal atrial fibrillation, anemia, chronic Foley catheter placement presents to ED with chest pain.He was admitted for the further evaluation of NSTEMI. In view of his extensive cardiac history,marked anemia, poor candidate for anticoagulation or dual antiplatelet agents. Therapeutic options are limited and cardiology recommended palliative care consult.  Discharge Diagnoses:  Principal Problem:   Demand myocardial infarction [employer]) Active Problems:   Hyperlipidemia   COPD   GOLD IV/ 02/steroid dep    Chronic respiratory failure with hypoxia and hypercapnia (HCC)   Elevated troponin   Chronic diastolic CHF (congestive heart failure) (HCC)   Hypotension   Atrial fibrillation ([place])  NSTEMI Patient came in with chest pain with elevated troponins, EKG with normal sinus rhythm. Cardiology consulted recommended medical management at this time,in view of his multiple comorbidities, marked anemia poor candidate for dual platelet agents, recommended palliative care approach. Continue with aspirin and  statin.    History of coronary artery disease s/p CABG Intermittent atypical chest pain. As needed nitroglycerin ordered.    History of chronic respiratory failure with hypoxia, oxygen dependent in the setting of stage IV COPD and steroid-dependent No evidence of acute exacerbation at this time no wheezing heard on exam Continuous bronchodilators as needed and prednisone. He can use BIPAP while at the facility and go back to Trilogy when he is discharged to home.   Chronic diastolic heart failure Last echocardiogram showed preserved left ventricular ejection fraction with no regional wall abnormalities Continue with Lasix40 mg BID. and strict intake and output and daily weights. Appreciate cardiology recommendations.    Paroxysmal atrial fibrillation .Rate better controlled With metoprolol    Stage II sacral decubitus ulcer present on admission left medial lower extremity wound present on admission Pressure Injury 01/24/20 Coccyx Mid;Lower Stage 2 - Partial thickness loss of dermis presenting as a shallow open injury with a red, pink wound bed without slough. (Active)  01/24/20 1445  Location: Coccyx  Location Orientation: Mid;Lower  Staging: Stage 2 - Partial thickness loss of dermis presenting as a shallow open injury with a red, pink wound bed without slough.  Wound Description (Comments):   Present on Admission: Yes    Pressure Injury 02/06/20 Buttocks Left Stage 2 - Partial thickness loss of dermis presenting as a shallow open injury with a red, pink wound bed without slough. Above previous wound (Active)  02/06/20 (noted by [facility] nurse on consult) 1459  Location: Buttocks  Location Orientation: Left  Staging: Stage 2 - Partial thickness loss of dermis presenting as a shallow open injury with a red, pink wound bed without slough.  Wound Description (Comments): Above previous wound  Present on Admission: Yes    Pressure Injury  04/08/20 Buttocks  Medial Stage 3 - Full thickness tissue loss. Subcutaneous fat may be visible but bone, tendon or muscle are NOT exposed. (Active)  04/08/20 1500  Location: Buttocks  Location Orientation: Medial  Staging: Stage 3 - Full thickness tissue loss. Subcutaneous fat may be visible but bone, tendon or muscle are NOT exposed.  Wound Description (Comments):   Present on Admission: Yes    Continue with local wound care.    Chronic hyponatremia Sodium at 128.Probably secondary to diuresis    In view of his multiple medical problems, significant comorbidities poor prognosis deconditioning and debility, end-stage COPD, coronary artery disease, peripheral vascular disease s/p right AKA with a chronic left leg wound, chronic sacral decubitus ulcer admitted for NSTEMI palliative care consulted and [facility].    Discharge Instructions      Discharge Instructions    Diet - low sodium heart healthy   Complete by: As directed    Discharge instructions   Complete by: As directed    Please follow up with cardiology as recommended.  Please follow upw ith outpatient palliative care.   Discharge wound care:   Complete by: As directed    Cleanse the wound on the LLE and the coccyx with soap and water, pat dry. For the LLE, PLACE A PIECE OF Aquacel Kellie Simmering [phone] INTO the wound, then cover with kerlex. Do NOT wrap an ace wrap around the leg. For the Coccyx, place a small piece of Aquacel over the coccyx wound, then a foam dressing. Perform daily.            06/07/20 0913   Increase activity slowly   Complete by: As directed           Allergies as of 06/10/2020      Reactions   Cefdinir Diarrhea, Other (See Comments)   Severe Diarrhea   Nitrofurantoin Swelling, Other (See Comments)   Hand Swelling   Sulfa Antibiotics Anaphylaxis, Swelling   Sulfonamide Derivatives Swelling, Other (See Comments)    Facial/tongue swelling   Tape Other (See Comments)   SKIN IS VERY THIN AND TEARS EASILY!!!!! Please do NOT use "plastic" tape- USE PAPER!!   Ciprofloxacin Itching, Rash, Other (See Comments)   Red itchy hands   Nitrofurantoin Swelling, Other (See Comments)   Swollen hands   Amoxicillin Er Other (See Comments)   Frequest urination. Pt reports "not really" allergic 01/24/20   Doxycycline Other (See Comments)   "Felt terrible" Pt wife reports "not really" allergic 01/24/20   Keflex [cephalexin] Other (See Comments)   Pt does not recall reaction    Mirtazapine Other (See Comments)   Pt does not recall reaction   Ranolazine Other (See Comments)   Inability to ambulate    Chlorhexidine Gluconate Rash   Levaquin [levofloxacin] Itching, Rash   Sertraline Anxiety, Other (See Comments)   Makes the patient jittery               Medication List        STOP taking these medications       alfuzosin 10 MG 24 hr tablet Commonly known as: UROXATRAL   FINASTERIDE PO   HYDROcodone-acetaminophen 5-325 MG tablet Commonly known as: NORCO/VICODIN   mometasone 50 MCG/ACT nasal spray Commonly known as: NASONEX   TOPROL XL PO             TAKE these medications   acetaminophen 500 MG tablet Commonly known as: TYLENOL Take  500 mg by mouth every 6 (six) hours as needed for moderate pain.   ALPRAZolam 0.5 MG tablet Commonly known as: XANAX Take 1 tablet (0.5 mg total) by mouth 3 (three) times daily as needed for anxiety.   aspirin 81 MG tablet Take 81 mg by mouth daily.   atorvastatin 40 MG tablet Commonly known as: LIPITOR Take 1 tablet (40 mg total) by mouth at bedtime. What changed:   medication strength  how much to take   calcium carbonate 500 MG chewable tablet Commonly known as: TUMS - dosed in mg elemental calcium Chew 1 tablet (200 mg of elemental calcium total) by mouth 2 (two) times daily as needed for indigestion or heartburn.    COLLAGEN MATRIX-SILVER EX Apply 1 tablet topically every other day.   famotidine 20 MG tablet Commonly known as: PEPCID Take 20 mg by mouth at bedtime as needed for heartburn or indigestion.   finasteride 5 MG tablet Commonly known as: PROSCAR Take 1 tablet (5 mg total) by mouth daily with lunch.   FLUoxetine 10 MG capsule Commonly known as: PROZAC Take 10 mg by mouth at bedtime.   fluticasone 50 MCG/ACT nasal spray Commonly known as: FLONASE Place 1-2 sprays into both nostrils daily.   furosemide 20 MG tablet Commonly known as: LASIX Take 40-80 mg by mouth See admin instructions. In 20mg  tablets, take 40mg  daily and 80mg  PRN What changed: Another medication with the same name was removed. Continue taking this medication, and follow the directions you see here.   guaiFENesin 600 MG 12 hr tablet Commonly known as: MUCINEX Take 1 tablet (600 mg total) by mouth 2 (two) times daily.   levalbuterol 0.63 MG/3ML nebulizer solution Commonly known as: XOPENEX Take 3 mLs (0.63 mg total) by nebulization every 4 (four) hours as needed for wheezing or shortness of breath.   levalbuterol 45 MCG/ACT inhaler Commonly known as: Xopenex HFA Inhale 2 puffs into the lungs every 4 (four) hours as needed for wheezing or shortness of breath.   metoprolol tartrate 25 MG tablet Commonly known as: LOPRESSOR Take 0.5 tablets (12.5 mg total) by mouth 3 (three) times daily.   Nitrostat 0.4 MG SL tablet Generic drug: nitroGLYCERIN Place 0.4 mg under the tongue every 5 (five) minutes as needed for chest pain.   oxyCODONE 5 MG immediate release tablet Commonly known as: Oxy IR/ROXICODONE Take 0.5 tablets (2.5 mg total) by mouth every 6 (six) hours as needed for breakthrough pain.   OXYGEN Inhale 2 L/min into the lungs continuous.   pantoprazole 40 MG tablet Commonly known as: PROTONIX TAKE 1 TABLET(40 MG) BY MOUTH DAILY 30 TO 60 MINUTES BEFORE FIRST MEAL OF THE DAY    polyethylene glycol 17 g packet Commonly known as: MIRALAX / GLYCOLAX Take 17 g by mouth daily. What changed:   when to take this  reasons to take this   predniSONE 10 MG tablet Commonly known as: DELTASONE Take 1 tablet (10 mg total) by mouth daily.   PreserVision AREDS 2 Caps Take 1 capsule by mouth daily.   sodium chloride 5 % ophthalmic solution Commonly known as: MURO 128 Place 1 drop into both eyes 4 (four) times daily.   Spiriva Respimat 2.5 MCG/ACT Aers Generic drug: Tiotropium Bromide Monohydrate INHALE 2 PUFFS BY MOUTH EVERY DAY What changed: See the new instructions.   SWEEN EX Apply 1 application topically daily.   Symbicort 160-4.5 MCG/ACT inhaler Generic drug: budesonide-formoterol Inhale 2 puffs into the lungs 2 (two) times daily.  Systane 0.4-0.3 % Soln Generic drug: Polyethyl Glycol-Propyl Glycol Apply 1 drop to eye 2 (two) times daily as needed (eye irritation). What changed: when to take this   vitamin B-12 1000 MCG tablet Commonly known as: CYANOCOBALAMIN Take 1 tablet (1,000 mcg total) by mouth daily.   Vitamin D3 50 MCG (2000 UT) Tabs Take 2,000 Units by mouth daily with lunch.                        Discharge Care Instructions  (From admission, onward)                        Start     Ordered    06/10/20 0000  Discharge wound care:       Comments: Cleanse the wound on the LLE and the coccyx with soap and water, pat dry. For the LLE, PLACE A PIECE OF Aquacel Kellie Simmering [phone]) INTO the wound, then cover with kerlex. Do NOT wrap an ace wrap around the leg. For the Coccyx, place a small piece of Aquacel over the coccyx wound, then a foam dressing. Perform daily.            06/07/20 0913   06/10/20 0940                Follow-up Information        Belva Crome, MD Follow up on 07/01/2020.   Specialty: Cardiology Why: at 11:15 Am with his nurse practitioner Urban Gibson. Contact information: [address] [place] [address] [phone]             [name], MD. Schedule an appointment as soon as possible for a visit in 1 week(s).   Specialty: Family Medicine Contact information: [place] [address] [phone]                    Allergies  Allergen Reactions  . Cefdinir Diarrhea and Other (See Comments)    Severe Diarrhea  . Nitrofurantoin Swelling and Other (See Comments)    Hand Swelling  . Sulfa Antibiotics Anaphylaxis and Swelling  . Sulfonamide Derivatives Swelling and Other (See Comments)    Facial/tongue swelling  . Tape Other (See Comments)    SKIN IS VERY THIN AND TEARS EASILY!!!!! Please do NOT use "plastic" tape- USE PAPER!!  . Ciprofloxacin Itching, Rash and Other (See Comments)    Red itchy hands  . Nitrofurantoin Swelling and Other (See Comments)    Swollen hands  . Amoxicillin Er Other (See Comments)    Frequest urination. Pt reports "not really" allergic 01/24/20  . Doxycycline Other (See Comments)    "Felt terrible" Pt wife reports "not really" allergic 01/24/20  . Keflex [Cephalexin] Other (See Comments)    Pt does not recall reaction   . Mirtazapine Other (See Comments)    Pt does not recall reaction  . Ranolazine Other (See Comments)    Inability to ambulate   . Chlorhexidine Gluconate Rash  . Levaquin [Levofloxacin] Itching and Rash  . Sertraline Anxiety and Other (See Comments)    Makes the patient jittery    Consultations: Cardiology.   Procedures/Studies: Imaging Results   DG Chest 2 View  Result Date: 06/05/2020 CLINICAL DATA:  Chest pain. EXAM: CHEST - 2 VIEW COMPARISON:  02/08/2020 and older studies. FINDINGS: Stable changes from prior CABG surgery. Cardiac silhouette is normal in size and configuration. No mediastinal  or hilar masses. No adenopathy. Lungs are hyperexpanded. Mild interstitial prominence most evident at the bases. Lungs  otherwise clear. No pleural effusion or pneumothorax. Skeletal structures are demineralized, but intact. IMPRESSION: 1. No acute cardiopulmonary disease. 2. COPD. Electronically Signed   By: Lajean Manes M.D.   On: 06/05/2020 17:29   ECHOCARDIOGRAM COMPLETE  Result Date: 06/06/2020    ECHOCARDIOGRAM REPORT   Patient Name:   [name] [facility] Date of Exam: 06/06/2020 Medical Rec #:  [id number]    Height:       69.0 in Accession #:    [id number]   Weight:       121.9 lb Date of Birth:  1929-09-19    BSA:          1.674 m Patient Age:    9 years     BP:           100/65 mmHg Patient Gender: M            HR:           69 bpm. Exam Location:  Inpatient Procedure: 2D Echo, Cardiac Doppler and Color Doppler Indications:    R07.9* Chest pain, unspecified  History:        Patient has prior history of Echocardiogram examinations, most                 recent 01/28/2020. CHF, Previous Myocardial Infarction and CAD,                 Abnormal ECG, COPD, Arrythmias:Atrial Fibrillation and Atrial                 Flutter, Signs/Symptoms:Shortness of Breath and Dyspnea; Risk                 Factors:Hypertension and Dyslipidemia. Cor pulmonale.  Sonographer:    Roseanna Rainbow RDCS Referring Phys: [id number] Rhetta Mura  Sonographer Comments: Technically difficult study due to poor echo windows, suboptimal parasternal window and suboptimal apical window. Extremely difficult windows. IMPRESSIONS  1. Left ventricular ejection fraction, by estimation, is 60 to 65%. The left ventricle has normal function. The left ventricle has no regional wall motion abnormalities. Left ventricular diastolic parameters were normal.  2. Right ventricular systolic function is low normal. The right ventricular size is mildly enlarged. There is moderately elevated pulmonary artery systolic pressure.  3. The mitral valve is degenerative. Mild mitral valve regurgitation. Moderate mitral annular calcification.  4. Tricuspid valve regurgitation is moderate.  5. The  aortic valve is grossly normal. There is mild calcification of the aortic valve. There is mild thickening of the aortic valve. Aortic valve regurgitation is not visualized. Mild aortic valve sclerosis is present, with no evidence of aortic valve stenosis. Comparison(s): No significant change from prior study. FINDINGS  Left Ventricle: Left ventricular ejection fraction, by estimation, is 60 to 65%. The left ventricle has normal function. The left ventricle has no regional wall motion abnormalities. The left ventricular internal cavity size was normal in size. There is  no left ventricular hypertrophy. Abnormal (paradoxical) septal motion consistent with post-operative status. Left ventricular diastolic parameters were normal. Right Ventricle: The right ventricular size is mildly enlarged. Right vetricular wall thickness was not well visualized. Right ventricular systolic function is low normal. There is moderately elevated pulmonary artery systolic pressure. The tricuspid regurgitant velocity is 3.01 m/s, and with an assumed right atrial pressure of 15 mmHg, the estimated right ventricular systolic pressure is 30.8 mmHg. Left Atrium: Left atrial  size was normal in size. Right Atrium: Right atrial size was normal in size. Pericardium: Trivial pericardial effusion is present. Mitral Valve: The mitral valve is degenerative in appearance. There is mild thickening of the mitral valve leaflet(s). There is mild calcification of the mitral valve leaflet(s). Moderate mitral annular calcification. Mild mitral valve regurgitation. Tricuspid Valve: The tricuspid valve is normal in structure. Tricuspid valve regurgitation is moderate. Aortic Valve: The aortic valve is grossly normal. There is mild calcification of the aortic valve. There is mild thickening of the aortic valve. Aortic valve regurgitation is not visualized. Mild aortic valve sclerosis is present, with no evidence of aortic valve stenosis. Pulmonic Valve: The  pulmonic valve was not well visualized. Pulmonic valve regurgitation is not visualized. Aorta: The aortic root, ascending aorta and aortic arch are all structurally normal, with no evidence of dilitation or obstruction. IAS/Shunts: The atrial septum is grossly normal.  LEFT VENTRICLE PLAX 2D LVIDd:         3.60 cm     Diastology LVIDs:         2.50 cm     LV e' medial:    7.65 cm/s LV PW:         1.10 cm     LV E/e' medial:  13.5 LV IVS:        1.10 cm     LV e' lateral:   8.93 cm/s LVOT diam:     2.30 cm     LV E/e' lateral: 11.5 LV SV:         57 LV SV Index:   34 LVOT Area:     4.15 cm  LV Volumes (MOD) LV vol d, MOD A2C: 36.7 ml LV vol d, MOD A4C: 64.4 ml LV vol s, MOD A2C: 18.9 ml LV vol s, MOD A4C: 24.3 ml LV SV MOD A2C:     17.8 ml LV SV MOD A4C:     64.4 ml LV SV MOD BP:      28.4 ml RIGHT VENTRICLE RV S prime:     7.73 cm/s TAPSE (M-mode): 1.4 cm LEFT ATRIUM           Index       RIGHT ATRIUM           Index LA diam:      3.40 cm 2.03 cm/m  RA Area:     13.10 cm LA Vol (A2C): 19.1 ml 11.41 ml/m RA Volume:   33.90 ml  20.25 ml/m LA Vol (A4C): 19.9 ml 11.89 ml/m  AORTIC VALVE LVOT Vmax:   70.30 cm/s LVOT Vmean:  44.100 cm/s LVOT VTI:    0.138 m  AORTA Ao Root diam: 3.70 cm Ao Asc diam:  3.50 cm MITRAL VALVE                TRICUSPID VALVE MV Area (PHT): 5.27 cm     TR Peak grad:   36.2 mmHg MV Decel Time: 144 msec     TR Vmax:        301.00 cm/s MV E velocity: 103.00 cm/s MV A velocity: 64.00 cm/s   SHUNTS MV E/A ratio:  1.61         Systemic VTI:  0.14 m                             Systemic Diam: 2.30 cm Buford Dresser MD Electronically signed by Buford Dresser MD Signature Date/Time: 06/06/2020/10:57:09  AM    Final         Subjective: No stuffiness. No chest pain or sob.  Discharge Exam:     Vitals:   06/10/20 0741 06/10/20 0823  BP: 132/66   Pulse: 70   Resp: 14   Temp: 98.9 F (37.2 C)   SpO2: 96% 99%         Vitals:   06/10/20 0017 06/10/20 0511 06/10/20  0741 06/10/20 0823  BP: 124/74 (!) 113/59 132/66   Pulse: 65 62 70   Resp: 13 16 14    Temp: 99.5 F (37.5 C) 99.5 F (37.5 C) 98.9 F (37.2 C)   TempSrc: Oral Oral Oral   SpO2: 97% 96% 96% 99%  Weight:  57.5 kg      General: Pt is alert, awake, not in acute distress Cardiovascular: RRR, S1/S2 +, no rubs, no gallops Respiratory: CTA bilaterally, no wheezing, no rhonchi Abdominal: Soft, NT, ND, bowel sounds + Extremities: no edema, no cyanosis     The results of significant diagnostics from this hospitalization (including imaging, microbiology, ancillary and laboratory) are listed below for reference.     Microbiology:        Recent Results (from the past 240 hour(s))  SARS CORONAVIRUS 2 (TAT 6-24 HRS) Nasopharyngeal Nasopharyngeal Swab     Status: None   Collection Time: 06/05/20  4:36 PM   Specimen: Nasopharyngeal Swab  Result Value Ref Range Status   SARS Coronavirus 2 NEGATIVE NEGATIVE Final    Comment: (NOTE) SARS-CoV-2 target nucleic acids are NOT DETECTED.  The SARS-CoV-2 RNA is generally detectable in upper and lower respiratory specimens during the acute phase of infection. Negative results do not preclude SARS-CoV-2 infection, do not rule out co-infections with other pathogens, and should not be used as the sole basis for treatment or other patient management decisions. Negative results must be combined with clinical observations, patient history, and epidemiological information. The expected result is Negative.  Fact Sheet for Patients: SugarRoll.be  Fact Sheet for Healthcare Providers: https://www.woods-mathews.com/  This test is not yet approved or cleared by the [place] FDA and  has been authorized for detection and/or diagnosis of SARS-CoV-2 by FDA under an Emergency Use Authorization (EUA). This EUA will remain  in effect (meaning this test can be used) for the duration of  the COVID-19 declaration under Se ction 564(b)(1) of the Act, 21 U.S.C. section 360bbb-3(b)(1), unless the authorization is terminated or revoked sooner.  Performed at [facility], [place] [address] [place], [place] [address]   Culture, blood (Routine X 2) w Reflex to ID Panel     Status: None (Preliminary result)   Collection Time: 06/06/20 12:38 AM   Specimen: BLOOD RIGHT ARM  Result Value Ref Range Status   Specimen Description BLOOD RIGHT ARM  Final   Special Requests   Final    BOTTLES DRAWN AEROBIC ONLY Blood Culture adequate volume   Culture   Final    NO GROWTH 4 DAYS Performed at [facility], [address], [place], [place] [address]    Report Status PENDING  Incomplete  Culture, blood (Routine X 2) w Reflex to ID Panel     Status: None (Preliminary result)   Collection Time: 06/06/20 12:53 AM   Specimen: BLOOD LEFT HAND  Result Value Ref Range Status   Specimen Description BLOOD LEFT HAND  Final   Special Requests   Final    BOTTLES DRAWN AEROBIC AND ANAEROBIC Blood Culture adequate  volume   Culture   Final    NO GROWTH 4 DAYS Performed at [facility], [place] [address] [place], [place] [address]    Report Status PENDING  Incomplete  SARS CORONAVIRUS 2 (TAT 6-24 HRS) Nasopharyngeal Nasopharyngeal Swab     Status: None   Collection Time: 06/09/20  4:48 PM   Specimen: Nasopharyngeal Swab  Result Value Ref Range Status   SARS Coronavirus 2 NEGATIVE NEGATIVE Final    Comment: (NOTE) SARS-CoV-2 target nucleic acids are NOT DETECTED.  The SARS-CoV-2 RNA is generally detectable in upper and lower respiratory specimens during the acute phase of infection. Negative results do not preclude SARS-CoV-2 infection, do not rule out co-infections with other pathogens, and should not be used as the sole basis for treatment or other patient management decisions. Negative results must be combined with clinical  observations, patient history, and epidemiological information. The expected result is Negative.  Fact Sheet for Patients: SugarRoll.be  Fact Sheet for Healthcare Providers: https://www.woods-mathews.com/  This test is not yet approved or cleared by the [place] FDA and  has been authorized for detection and/or diagnosis of SARS-CoV-2 by FDA under an Emergency Use Authorization (EUA). This EUA will remain  in effect (meaning this test can be used) for the duration of the COVID-19 declaration under Se ction 564(b)(1) of the Act, 21 U.S.C. section 360bbb-3(b)(1), unless the authorization is terminated or revoked sooner.  Performed at [facility], [place] [address], [place], [place] [address]      Labs: BNP (last 3 results) Recent Labs (within last 365 days)  Recent Labs    02/10/20 0646 06/05/20 1600 06/07/20 1326  BNP 334.1* 473.5* 407.0*     Basic Metabolic Panel: Last Labs          Recent Labs  Lab 06/05/20 1600 06/06/20 0052 06/07/20 0122 06/08/20 0257 06/09/20 0325  NA 130* 129* 127* 127* 128*  K 3.6 3.6 3.3* 4.2 4.1  CL 87* 90* 88* 89* 88*  CO2 31 33* 33* 31 32  GLUCOSE 138* 125* 107* 92 87  BUN 21 22 25* 21 17  CREATININE 1.32* 1.17 1.12 1.01 0.95  CALCIUM 9.2 8.7* 8.6* 8.7* 8.9  MG 2.0 2.0  --   --   --      Liver Function Tests: Last Labs       Recent Labs  Lab 06/05/20 2000 06/06/20 0052  AST 29 25  ALT 22 18  ALKPHOS 69 59  BILITOT 0.5 0.8  PROT 5.4* 4.7*  ALBUMIN 3.3* 2.9*     Last Labs   No results for input(s): LIPASE, AMYLASE in the last 168 hours.   Last Labs   No results for input(s): AMMONIA in the last 168 hours.   CBC: Last Labs         Recent Labs  Lab 06/05/20 1600 06/06/20 0052 06/07/20 0122 06/08/20 0257  WBC 17.1* 14.2* 9.3 5.9  NEUTROABS  --  13.5*  --   --   HGB 9.2* 7.8* 7.4* 7.3*  HCT 29.4* 24.4* 23.0* 22.9*  MCV 97.7 96.1 95.0 95.4  PLT 272  209 213 215     Cardiac Enzymes: Last Labs   No results for input(s): CKTOTAL, CKMB, CKMBINDEX, TROPONINI in the last 168 hours.   BNP: Last Labs   Invalid input(s): POCBNP   CBG: Last Labs   No results for input(s): GLUCAP in the last 168 hours.   D-Dimer Recent Labs (last 2 labs)  No results for input(s): DDIMER in the last 72 hours.   Hgb A1c Recent Labs (last 2 labs)   No results for input(s): HGBA1C in the last 72 hours.   Lipid Profile Recent Labs (last 2 labs)   No results for input(s): CHOL, HDL, LDLCALC, TRIG, CHOLHDL, LDLDIRECT in the last 72 hours.   Thyroid function studies  Recent Labs (last 2 labs)   No results for input(s): TSH, T4TOTAL, T3FREE, THYROIDAB in the last 72 hours.  Invalid input(s): FREET3   Anemia work up [employer] (last 2 labs)      Recent Labs    06/07/20 1326  FERRITIN 58  TIBC 301  IRON 18*  RETICCTPCT 1.4     Urinalysis Labs (Brief)          Component Value Date/Time   COLORURINE YELLOW 06/05/2020 1925   APPEARANCEUR HAZY (A) 06/05/2020 1925   LABSPEC 1.014 06/05/2020 1925   PHURINE 5.0 06/05/2020 1925   GLUCOSEU NEGATIVE 06/05/2020 1925   HGBUR NEGATIVE 06/05/2020 1925   BILIRUBINUR NEGATIVE 06/05/2020 1925   KETONESUR NEGATIVE 06/05/2020 1925   PROTEINUR NEGATIVE 06/05/2020 1925   NITRITE NEGATIVE 06/05/2020 1925   LEUKOCYTESUR LARGE (A) 06/05/2020 1925     Sepsis Labs Last Labs   Invalid input(s): PROCALCITONIN,  WBC,  LACTICIDVEN   Microbiology        Recent Results (from the past 240 hour(s))  SARS CORONAVIRUS 2 (TAT 6-24 HRS) Nasopharyngeal Nasopharyngeal Swab     Status: None   Collection Time: 06/05/20  4:36 PM   Specimen: Nasopharyngeal Swab  Result Value Ref Range Status   SARS Coronavirus 2 NEGATIVE NEGATIVE Final    Comment: (NOTE) SARS-CoV-2 target nucleic acids are NOT DETECTED.  The SARS-CoV-2 RNA is generally detectable in upper and lower respiratory specimens  during the acute phase of infection. Negative results do not preclude SARS-CoV-2 infection, do not rule out co-infections with other pathogens, and should not be used as the sole basis for treatment or other patient management decisions. Negative results must be combined with clinical observations, patient history, and epidemiological information. The expected result is Negative.  Fact Sheet for Patients: SugarRoll.be  Fact Sheet for Healthcare Providers: https://www.woods-mathews.com/  This test is not yet approved or cleared by the [place] FDA and  has been authorized for detection and/or diagnosis of SARS-CoV-2 by FDA under an Emergency Use Authorization (EUA). This EUA will remain  in effect (meaning this test can be used) for the duration of the COVID-19 declaration under Se ction 564(b)(1) of the Act, 21 U.S.C. section 360bbb-3(b)(1), unless the authorization is terminated or revoked sooner.  Performed at [facility], [place] [address], [place], [place] [address]   Culture, blood (Routine X 2) w Reflex to ID Panel     Status: None (Preliminary result)   Collection Time: 06/06/20 12:38 AM   Specimen: BLOOD RIGHT ARM  Result Value Ref Range Status   Specimen Description BLOOD RIGHT ARM  Final   Special Requests   Final    BOTTLES DRAWN AEROBIC ONLY Blood Culture adequate volume   Culture   Final    NO GROWTH 4 DAYS Performed at [facility], [address], [place], [place] [address]    Report Status PENDING  Incomplete  Culture, blood (Routine X 2) w Reflex to ID Panel     Status: None (Preliminary result)   Collection Time: 06/06/20 12:53 AM   Specimen: BLOOD LEFT HAND  Result Value  Ref Range Status   Specimen Description BLOOD LEFT HAND  Final   Special Requests   Final    BOTTLES DRAWN AEROBIC AND ANAEROBIC Blood Culture adequate volume   Culture   Final    NO GROWTH 4  DAYS Performed at [facility], [address] [place], [place] [address]    Report Status PENDING  Incomplete  SARS CORONAVIRUS 2 (TAT 6-24 HRS) Nasopharyngeal Nasopharyngeal Swab     Status: None   Collection Time: 06/09/20  4:48 PM   Specimen: Nasopharyngeal Swab  Result Value Ref Range Status   SARS Coronavirus 2 NEGATIVE NEGATIVE Final    Comment: (NOTE) SARS-CoV-2 target nucleic acids are NOT DETECTED.  The SARS-CoV-2 RNA is generally detectable in upper and lower respiratory specimens during the acute phase of infection. Negative results do not preclude SARS-CoV-2 infection, do not rule out co-infections with other pathogens, and should not be used as the sole basis for treatment or other patient management decisions. Negative results must be combined with clinical observations, patient history, and epidemiological information. The expected result is Negative.  Fact Sheet for Patients: SugarRoll.be  Fact Sheet for Healthcare Providers: https://www.woods-mathews.com/  This test is not yet approved or cleared by the [place] FDA and  has been authorized for detection and/or diagnosis of SARS-CoV-2 by FDA under an Emergency Use Authorization (EUA). This EUA will remain  in effect (meaning this test can be used) for the duration of the COVID-19 declaration under Se ction 564(b)(1) of the Act, 21 U.S.C. section 360bbb-3(b)(1), unless the authorization is terminated or revoked sooner.  Performed at [facility], [place] [address], [place], [place] [address]      Time coordinating discharge: 39 minutes.  SIGNED:   Hosie Poisson, MD         [facility] 06/10/2020, 9:40 AM

## 2020-06-10 NOTE — Progress Notes (Signed)
Report given to a nurse at [facility].  Idolina Primer, RN

## 2020-06-10 NOTE — TOC Transition Note (Signed)
Transition of Care [facility]) - CM/SW Discharge Note   Patient Details  Name: [name] MRN: [id number] Date of Birth: [birth year]  Transition of Care [facility]) CM/SW Contact:  Bethann Berkshire, [place] Phone Number: 06/10/2020, 11:23 AM   Clinical Narrative:     Patient will DC to: [facility] Anticipated DC date: 06/10/20 Family notified: Spouse [name] Transport by: Corey Harold   Per MD patient ready for DC to . RN, patient, patient's family, and facility notified of DC. Discharge Summary and FL2 sent to facility. RN to call report prior to discharge [phone]). DC packet on chart. Ambulance transport requested for patient.   CSW will sign off for now as social work intervention is no longer needed. Please consult [place] again if new needs arise.     Barriers to Discharge: Continued Medical Work up   Patient Goals and CMS Choice Patient states their goals for this hospitalization and ongoing recovery are:: to go to SNF CMS Medicare.gov Compare Post Acute Care list provided to:: Patient (patient and patients spouse) Choice offered to / list presented to : [facility]  Discharge Placement                       Discharge Plan and Services In-house Referral: Clinical Social Work                                   Social Determinants of Health ([place]) Interventions     Readmission Risk Interventions No flowsheet data found.

## 2020-06-10 NOTE — Plan of Care (Signed)

## 2020-06-11 ENCOUNTER — Other Ambulatory Visit: Payer: Self-pay

## 2020-06-11 ENCOUNTER — Encounter (HOSPITAL_COMMUNITY): Payer: Self-pay

## 2020-06-11 DIAGNOSIS — L89313 Pressure ulcer of right buttock, stage 3: Secondary | ICD-10-CM | POA: Diagnosis present

## 2020-06-11 DIAGNOSIS — I5030 Unspecified diastolic (congestive) heart failure: Secondary | ICD-10-CM | POA: Diagnosis present

## 2020-06-11 DIAGNOSIS — I214 Non-ST elevation (NSTEMI) myocardial infarction: Principal | ICD-10-CM | POA: Diagnosis present

## 2020-06-11 DIAGNOSIS — L89152 Pressure ulcer of sacral region, stage 2: Secondary | ICD-10-CM | POA: Diagnosis present

## 2020-06-11 DIAGNOSIS — I739 Peripheral vascular disease, unspecified: Secondary | ICD-10-CM | POA: Diagnosis present

## 2020-06-11 DIAGNOSIS — I2510 Atherosclerotic heart disease of native coronary artery without angina pectoris: Secondary | ICD-10-CM | POA: Diagnosis present

## 2020-06-11 DIAGNOSIS — G8929 Other chronic pain: Secondary | ICD-10-CM | POA: Diagnosis present

## 2020-06-11 DIAGNOSIS — E43 Unspecified severe protein-calorie malnutrition: Secondary | ICD-10-CM | POA: Diagnosis present

## 2020-06-11 DIAGNOSIS — I222 Subsequent non-ST elevation (NSTEMI) myocardial infarction: Secondary | ICD-10-CM | POA: Diagnosis present

## 2020-06-11 DIAGNOSIS — Z89611 Acquired absence of right leg above knee: Secondary | ICD-10-CM

## 2020-06-11 DIAGNOSIS — Z8249 Family history of ischemic heart disease and other diseases of the circulatory system: Secondary | ICD-10-CM

## 2020-06-11 DIAGNOSIS — Z7951 Long term (current) use of inhaled steroids: Secondary | ICD-10-CM

## 2020-06-11 DIAGNOSIS — Z978 Presence of other specified devices: Secondary | ICD-10-CM

## 2020-06-11 DIAGNOSIS — L89159 Pressure ulcer of sacral region, unspecified stage: Secondary | ICD-10-CM | POA: Diagnosis present

## 2020-06-11 DIAGNOSIS — Z20822 Contact with and (suspected) exposure to covid-19: Secondary | ICD-10-CM | POA: Diagnosis present

## 2020-06-11 DIAGNOSIS — F419 Anxiety disorder, unspecified: Secondary | ICD-10-CM | POA: Diagnosis present

## 2020-06-11 DIAGNOSIS — D509 Iron deficiency anemia, unspecified: Secondary | ICD-10-CM | POA: Diagnosis present

## 2020-06-11 DIAGNOSIS — E039 Hypothyroidism, unspecified: Secondary | ICD-10-CM | POA: Diagnosis present

## 2020-06-11 DIAGNOSIS — I5033 Acute on chronic diastolic (congestive) heart failure: Secondary | ICD-10-CM

## 2020-06-11 DIAGNOSIS — Z85828 Personal history of other malignant neoplasm of skin: Secondary | ICD-10-CM

## 2020-06-11 DIAGNOSIS — E871 Hypo-osmolality and hyponatremia: Secondary | ICD-10-CM | POA: Diagnosis present

## 2020-06-11 DIAGNOSIS — R531 Weakness: Secondary | ICD-10-CM

## 2020-06-11 DIAGNOSIS — Z951 Presence of aortocoronary bypass graft: Secondary | ICD-10-CM

## 2020-06-11 DIAGNOSIS — Z66 Do not resuscitate: Secondary | ICD-10-CM | POA: Diagnosis present

## 2020-06-11 DIAGNOSIS — E559 Vitamin D deficiency, unspecified: Secondary | ICD-10-CM | POA: Diagnosis present

## 2020-06-11 DIAGNOSIS — Z7982 Long term (current) use of aspirin: Secondary | ICD-10-CM

## 2020-06-11 DIAGNOSIS — I110 Hypertensive heart disease with heart failure: Secondary | ICD-10-CM | POA: Diagnosis not present

## 2020-06-11 DIAGNOSIS — J9621 Acute and chronic respiratory failure with hypoxia: Secondary | ICD-10-CM | POA: Diagnosis not present

## 2020-06-11 DIAGNOSIS — T502X5A Adverse effect of carbonic-anhydrase inhibitors, benzothiadiazides and other diuretics, initial encounter: Secondary | ICD-10-CM | POA: Diagnosis present

## 2020-06-11 DIAGNOSIS — E785 Hyperlipidemia, unspecified: Secondary | ICD-10-CM | POA: Diagnosis present

## 2020-06-11 DIAGNOSIS — Z9981 Dependence on supplemental oxygen: Secondary | ICD-10-CM

## 2020-06-11 DIAGNOSIS — E873 Alkalosis: Secondary | ICD-10-CM | POA: Diagnosis present

## 2020-06-11 DIAGNOSIS — F411 Generalized anxiety disorder: Secondary | ICD-10-CM | POA: Diagnosis present

## 2020-06-11 DIAGNOSIS — N400 Enlarged prostate without lower urinary tract symptoms: Secondary | ICD-10-CM | POA: Diagnosis present

## 2020-06-11 DIAGNOSIS — Z8551 Personal history of malignant neoplasm of bladder: Secondary | ICD-10-CM

## 2020-06-11 DIAGNOSIS — Z823 Family history of stroke: Secondary | ICD-10-CM

## 2020-06-11 DIAGNOSIS — Z6821 Body mass index (BMI) 21.0-21.9, adult: Secondary | ICD-10-CM

## 2020-06-11 DIAGNOSIS — K219 Gastro-esophageal reflux disease without esophagitis: Secondary | ICD-10-CM | POA: Diagnosis present

## 2020-06-11 DIAGNOSIS — Z7952 Long term (current) use of systemic steroids: Secondary | ICD-10-CM

## 2020-06-11 DIAGNOSIS — L89899 Pressure ulcer of other site, unspecified stage: Secondary | ICD-10-CM | POA: Diagnosis present

## 2020-06-11 DIAGNOSIS — J90 Pleural effusion, not elsewhere classified: Secondary | ICD-10-CM | POA: Diagnosis not present

## 2020-06-11 DIAGNOSIS — I509 Heart failure, unspecified: Secondary | ICD-10-CM

## 2020-06-11 DIAGNOSIS — E861 Hypovolemia: Secondary | ICD-10-CM | POA: Diagnosis present

## 2020-06-11 DIAGNOSIS — D500 Iron deficiency anemia secondary to blood loss (chronic): Secondary | ICD-10-CM

## 2020-06-11 DIAGNOSIS — L89810 Pressure ulcer of head, unstageable: Secondary | ICD-10-CM | POA: Diagnosis present

## 2020-06-11 DIAGNOSIS — R69 Illness, unspecified: Secondary | ICD-10-CM | POA: Diagnosis not present

## 2020-06-11 DIAGNOSIS — Z515 Encounter for palliative care: Secondary | ICD-10-CM

## 2020-06-11 DIAGNOSIS — J9811 Atelectasis: Secondary | ICD-10-CM | POA: Diagnosis not present

## 2020-06-11 DIAGNOSIS — L89322 Pressure ulcer of left buttock, stage 2: Secondary | ICD-10-CM | POA: Diagnosis present

## 2020-06-11 DIAGNOSIS — F32A Depression, unspecified: Secondary | ICD-10-CM | POA: Diagnosis present

## 2020-06-11 DIAGNOSIS — J449 Chronic obstructive pulmonary disease, unspecified: Secondary | ICD-10-CM | POA: Diagnosis present

## 2020-06-11 DIAGNOSIS — R0602 Shortness of breath: Secondary | ICD-10-CM | POA: Diagnosis not present

## 2020-06-11 DIAGNOSIS — Z87891 Personal history of nicotine dependence: Secondary | ICD-10-CM

## 2020-06-11 DIAGNOSIS — G4700 Insomnia, unspecified: Secondary | ICD-10-CM | POA: Diagnosis present

## 2020-06-11 DIAGNOSIS — R627 Adult failure to thrive: Secondary | ICD-10-CM | POA: Diagnosis present

## 2020-06-11 DIAGNOSIS — E8809 Other disorders of plasma-protein metabolism, not elsewhere classified: Secondary | ICD-10-CM | POA: Diagnosis present

## 2020-06-11 DIAGNOSIS — N5089 Other specified disorders of the male genital organs: Secondary | ICD-10-CM | POA: Diagnosis present

## 2020-06-11 DIAGNOSIS — Z79899 Other long term (current) drug therapy: Secondary | ICD-10-CM

## 2020-06-11 DIAGNOSIS — I480 Paroxysmal atrial fibrillation: Secondary | ICD-10-CM | POA: Diagnosis present

## 2020-06-11 LAB — CULTURE, BLOOD (ROUTINE X 2)
Culture: NO GROWTH
Culture: NO GROWTH
Special Requests: ADEQUATE
Special Requests: ADEQUATE

## 2020-06-11 MED ORDER — ALPRAZOLAM 0.5 MG PO TABS
0.5000 mg | ORAL_TABLET | Freq: Three times a day (TID) | ORAL | Status: DC | PRN
  Administered 2020-06-11 – 2020-06-16 (×12): 0.5 mg via ORAL
  Filled 2020-06-11: qty 1
  Filled 2020-06-11 (×4): qty 2
  Filled 2020-06-11: qty 1
  Filled 2020-06-11: qty 2
  Filled 2020-06-11 (×2): qty 1
  Filled 2020-06-11 (×2): qty 2
  Filled 2020-06-11: qty 1

## 2020-06-11 MED ORDER — FLUOXETINE HCL 10 MG PO CAPS
10.0000 mg | ORAL_CAPSULE | Freq: Every day | ORAL | Status: DC
  Administered 2020-06-11 – 2020-06-16 (×6): 10 mg via ORAL
  Filled 2020-06-11 (×9): qty 1

## 2020-06-11 MED ORDER — ACETAMINOPHEN 500 MG PO TABS
500.0000 mg | ORAL_TABLET | Freq: Four times a day (QID) | ORAL | Status: DC | PRN
  Administered 2020-06-11 – 2020-06-19 (×13): 500 mg via ORAL
  Filled 2020-06-11 (×14): qty 1

## 2020-06-11 MED ORDER — VITAMIN D 25 MCG (1000 UNIT) PO TABS
2000.0000 [IU] | ORAL_TABLET | Freq: Every day | ORAL | Status: DC
  Administered 2020-06-12 – 2020-06-20 (×9): 2000 [IU] via ORAL
  Filled 2020-06-11 (×10): qty 2

## 2020-06-11 MED ORDER — FUROSEMIDE 20 MG PO TABS
40.0000 mg | ORAL_TABLET | Freq: Every day | ORAL | Status: DC
  Administered 2020-06-11 – 2020-06-12 (×2): 40 mg via ORAL
  Filled 2020-06-11 (×2): qty 2

## 2020-06-11 MED ORDER — ALBUTEROL SULFATE (2.5 MG/3ML) 0.083% IN NEBU
2.5000 mg | INHALATION_SOLUTION | RESPIRATORY_TRACT | Status: DC | PRN
  Administered 2020-06-21: 2.5 mg via RESPIRATORY_TRACT
  Filled 2020-06-11: qty 3

## 2020-06-11 MED ORDER — FLUTICASONE PROPIONATE 50 MCG/ACT NA SUSP
1.0000 | Freq: Every day | NASAL | Status: DC
  Administered 2020-06-13: 1 via NASAL
  Administered 2020-06-15 – 2020-06-16 (×2): 2 via NASAL
  Administered 2020-06-17: 1 via NASAL
  Administered 2020-06-18 – 2020-06-21 (×4): 2 via NASAL
  Filled 2020-06-11 (×3): qty 16

## 2020-06-11 MED ORDER — MOMETASONE FURO-FORMOTEROL FUM 200-5 MCG/ACT IN AERO
2.0000 | INHALATION_SPRAY | Freq: Two times a day (BID) | RESPIRATORY_TRACT | Status: DC
  Filled 2020-06-11 (×2): qty 8.8

## 2020-06-11 MED ORDER — VITAMIN B-12 1000 MCG PO TABS
1000.0000 ug | ORAL_TABLET | Freq: Every day | ORAL | Status: DC
  Administered 2020-06-12 – 2020-06-20 (×9): 1000 ug via ORAL
  Filled 2020-06-11 (×10): qty 1

## 2020-06-11 MED ORDER — FAMOTIDINE 20 MG PO TABS
20.0000 mg | ORAL_TABLET | Freq: Every evening | ORAL | Status: DC | PRN
  Filled 2020-06-11: qty 1

## 2020-06-11 MED ORDER — NITROGLYCERIN 0.4 MG SL SUBL
0.4000 mg | SUBLINGUAL_TABLET | SUBLINGUAL | Status: DC | PRN
  Administered 2020-06-21 (×2): 0.4 mg via SUBLINGUAL
  Filled 2020-06-11: qty 1

## 2020-06-11 MED ORDER — METOPROLOL TARTRATE 12.5 MG HALF TABLET
12.5000 mg | ORAL_TABLET | Freq: Three times a day (TID) | ORAL | Status: DC
  Administered 2020-06-11 – 2020-06-19 (×24): 12.5 mg via ORAL
  Filled 2020-06-11 (×25): qty 1

## 2020-06-11 MED ORDER — UMECLIDINIUM BROMIDE 62.5 MCG/INH IN AEPB
1.0000 | INHALATION_SPRAY | Freq: Every day | RESPIRATORY_TRACT | Status: DC
  Filled 2020-06-11 (×2): qty 7

## 2020-06-11 MED ORDER — OXYCODONE HCL 5 MG PO TABS
2.5000 mg | ORAL_TABLET | Freq: Four times a day (QID) | ORAL | Status: DC | PRN
Start: 2020-06-11 — End: 2020-06-16

## 2020-06-11 MED ORDER — SWEEN EX CREA: TOPICAL_CREAM | Freq: Every day | CUTANEOUS | Status: DC

## 2020-06-11 MED ORDER — ASPIRIN EC 81 MG PO TBEC
81.0000 mg | DELAYED_RELEASE_TABLET | Freq: Every day | ORAL | Status: DC
  Administered 2020-06-11 – 2020-06-20 (×10): 81 mg via ORAL
  Filled 2020-06-11 (×10): qty 1

## 2020-06-11 MED ORDER — CALCIUM CARBONATE ANTACID 500 MG PO CHEW
1.0000 | CHEWABLE_TABLET | Freq: Two times a day (BID) | ORAL | Status: DC | PRN
  Administered 2020-06-18 – 2020-06-20 (×2): 200 mg via ORAL
  Filled 2020-06-11 (×4): qty 1

## 2020-06-11 MED ORDER — SODIUM CHLORIDE (HYPERTONIC) 5 % OP SOLN
1.0000 [drp] | Freq: Four times a day (QID) | OPHTHALMIC | Status: DC
  Administered 2020-06-11 – 2020-06-21 (×31): 1 [drp] via OPHTHALMIC
  Filled 2020-06-11 (×3): qty 15

## 2020-06-11 MED ORDER — FINASTERIDE 5 MG PO TABS
5.0000 mg | ORAL_TABLET | Freq: Every day | ORAL | Status: DC
  Administered 2020-06-12 – 2020-06-20 (×9): 5 mg via ORAL
  Filled 2020-06-11 (×10): qty 1

## 2020-06-11 MED ORDER — ATORVASTATIN CALCIUM 40 MG PO TABS
40.0000 mg | ORAL_TABLET | Freq: Every day | ORAL | Status: DC
  Administered 2020-06-11 – 2020-06-21 (×11): 40 mg via ORAL
  Filled 2020-06-11 (×11): qty 1

## 2020-06-11 MED ORDER — FUROSEMIDE 20 MG PO TABS: 40.0000 mg | ORAL_TABLET | ORAL | Status: DC

## 2020-06-11 NOTE — Social Work (Signed)
CSW rewrote FL2 to include Trilogy BiPap and sent it out to area SNFs in an attempt to find placement.  CSW called [facility] who do accept Triliogy BiPaps, they will review for possible placement.

## 2020-06-11 NOTE — ED Triage Notes (Addendum)
See chief complaint, pt also has foley cath in place. Wife states pt has had foley for at least 6 months, leg strap in place, draining clear, pale yellow urine

## 2020-06-11 NOTE — ED Notes (Signed)
Received pt from [facility] for placement.  Wife is with pt.  Pt has no complaints

## 2020-06-11 NOTE — NC FL2 (Signed)
[place] LEVEL OF CARE SCREENING TOOL     IDENTIFICATION  Patient Name: [name] Birthdate: 28-Jul-1929 Sex: male Admission Date (Current Location): 06/25/2020  [facility] and [place] Number:  Herbalist and Address:  [facility], [place] [address] [place], [place] [address]      Provider Number: [id number]  Attending Physician Name and Address:  Default, Provider, MD  Relative Name and Phone Number:  [name] [phone]    Current Level of Care: Hospital Recommended Level of Care: [place] Prior Approval Number:    Date Approved/Denied:   PASRR Number: [id number]  Discharge Plan: SNF    Current Diagnoses: Patient Active Problem List   Diagnosis Date Noted  . Hypotension 06/05/2020  . Atrial fibrillation ([place])   . Atrial fibrillation with RVR ([place]) 04/08/2020  . Fall at home, initial encounter 04/08/2020  . Chronic pain 04/08/2020  . Asterixis 02/27/2020  . Chronic peripheral venous hypertension with lower extremity complication 50/27/7412  . Complete traumatic amputation at level between right hip and knee, initial encounter ([place]) 02/27/2020  . Difficulty in walking, not elsewhere classified 02/27/2020  . Dyspepsia 02/27/2020  . Foot ulcer ([place]) 02/27/2020  . Hyperglycemia 02/27/2020  . Iron deficiency anemia 02/27/2020  . Major depression single episode, in partial remission ([place]) 02/27/2020  . Malnutrition related to chronic disease ([place]) 02/27/2020  . Osteomyelitis ([place]) 02/27/2020  . Paresthesia 02/27/2020  . Right upper quadrant pain 02/27/2020  . Slow transit constipation 02/27/2020  . Spasm of bladder 02/27/2020  . Urinary tract obstruction 02/27/2020  . Vitamin D deficiency 02/27/2020  . Congestive heart failure ([place]) 02/27/2020  . Prediabetes   . Urinary retention   . Normocytic anemia   . Chronic diastolic congestive heart failure ([place])   . Major depressive disorder with current  active episode   . Paroxysmal atrial fibrillation (HCC)   . Right above-knee amputee ([place]) 02/05/2020  . Typical atrial flutter ([place])   . New onset atrial fibrillation ([place])   . Malnutrition of moderate degree 01/30/2020  . Cellulitis of left foot 01/24/2020  . Cellulitis and abscess of left lower extremity 01/24/2020  . Abnormality of gait 10/20/2019  . Pain of left heel 09/01/2019  . Labile blood pressure   . Anxiety state   . Hypothyroidism   . Right hip pain   . Sacral decubitus ulcer   . Post-operative pain   . Chronic systolic congestive heart failure ([place])   . Urinary retention due to benign prostatic hyperplasia 08/20/2019  . SOB (shortness of breath)   . Chronic systolic CHF (congestive heart failure) ([place])   . Hypoalbuminemia due to protein-calorie malnutrition ([place])   . Transaminitis   . Acute blood loss anemia   . Hyponatremia   . Postoperative pain   . Supplemental oxygen dependent   . S/P AKA (above knee amputation) ([place]) 08/14/2019  . Peripheral arterial disease ([employer]) 08/11/2019  . Abnormal urinalysis 08/11/2019  . Critical lower limb ischemia ([place]) 08/11/2019  . DNR (do not resuscitate)   . Voiding difficulty   . Palliative care by specialist   . Pressure injury of skin 06/29/2019  . Cellulitis of left lower extremity 06/28/2019  . Cellulitis of right lower extremity 06/27/2019  . AKI (acute kidney injury) ([place]) 06/27/2019  . Headache 10/16/2018  . Severe major depression without psychotic features ([place]) 09/22/2017  . Generalized anxiety disorder 09/22/2017  . DNR (do not resuscitate) discussion 05/10/2017  . Weakness generalized  04/04/2017  . Chest pain 03/24/2017  . AV block, [place] 1 03/24/2017  . CAD (coronary artery disease) 03/24/2017  . Chronic diastolic CHF (congestive heart failure) ([place]) 03/24/2017  . COPD (chronic obstructive pulmonary disease) ([place]) 03/24/2017  . Demand myocardial infarction ([place])   . Elevated troponin 03/09/2017  . Acute  on chronic diastolic heart failure ([place])   . Tachycardia 03/08/2017  . Influenza A 02/04/2017  . Cor pulmonale, chronic (HCC)/ clinical dx  09/13/2016  . Orthostatic hypotension 09/26/2015  . Angina at rest [facility]) 09/24/2015  . BPH (benign prostatic hyperplasia) 09/13/2015  . GERD (gastroesophageal reflux disease) 09/13/2015  . Anxiety disorder 09/13/2015  . Sinusitis, chronic 09/09/2015  . DOE (dyspnea on exertion) 04/15/2015  . Acute heart failure ([place]) 03/18/2015  . Right calf pain 06/18/2014  . Coronary artery disease involving coronary bypass graft of native heart with angina pectoris ([place])   . Peripheral vascular disease ([place])   . Essential hypertension   . Obstructive sleep apnea   . Rhinitis, chronic 01/29/2007  . COPD   GOLD IV/ 02/steroid dep  01/29/2007  . Chronic respiratory failure with hypoxia and hypercapnia ([place]) 01/29/2007  . Hyperlipidemia     Orientation RESPIRATION BLADDER Height & Weight     Self,Time,Situation,Place  O2 (nasal cannula 2 liters) Continent Weight: 126 lb 12.2 oz (57.5 kg) Height:  5\' 8"  (172.7 cm)  BEHAVIORAL SYMPTOMS/MOOD NEUROLOGICAL BOWEL NUTRITION STATUS      Continent Diet (heart healthy)  AMBULATORY STATUS COMMUNICATION OF NEEDS Skin   Limited Assist Verbally Other (Comment) (Amputation right leg, ecchymosis arm bilateral, Pl buttocks medial stage 3, dressing change every other day, Incision closed pretibial left ABD every 3 days)                       Personal Care Assistance Level of Assistance  Bathing,Feeding,Dressing Bathing Assistance: Limited assistance Feeding assistance: Independent Dressing Assistance: Limited assistance     Functional Limitations Info  Sight,Hearing,Speech Sight Info: Impaired Hearing Info: Impaired Speech Info: Adequate    SPECIAL CARE FACTORS FREQUENCY  PT (By licensed PT),OT (By licensed OT)     PT Frequency: 5x weekly OT Frequency: 5x weekly            Contractures Contractures  Info: Not present    Additional Factors Info  Code Status,Allergies,Psychotropic Code Status Info: Partial Allergies Info: Cefdinir, Nitrofurantoin, Sulfa Antibiotic, Derivatives, Tape, Cipofloxacin, Amoxicillin Er, Doxycycline, Keflex (cephalexin), Mirtazapine, Ranolazine, Chlorhexidine Gluconate, Levaquin (levofloxacin), Sertaline. Psychotropic Info: Fluoxetine capsule 10mg  daily at bedtime         Current Medications (07/08/2020):  This is the current hospital active medication list Current Facility-Administered Medications  Medication Dose Route Frequency Provider Last Rate Last Admin  . acetaminophen (TYLENOL) tablet 500 mg  500 mg Oral Q6H PRN Lacretia Leigh, MD      . albuterol (PROVENTIL) (2.5 MG/3ML) 0.083% nebulizer solution 2.5 mg  2.5 mg Inhalation Q4H PRN Lacretia Leigh, MD      . ALPRAZolam Duanne Moron) tablet 0.5 mg  0.5 mg Oral TID PRN Lacretia Leigh, MD      . Derrill Memo ON 06/12/2020] aspirin EC tablet 81 mg  81 mg Oral Daily Lacretia Leigh, MD   81 mg at 06/17/2020 1516  . atorvastatin (LIPITOR) tablet 40 mg  40 mg Oral QHS Lacretia Leigh, MD      . calcium carbonate (TUMS - dosed in mg elemental calcium) chewable tablet 200 mg of elemental calcium  1 tablet Oral BID  PRN Lacretia Leigh, MD      . cholecalciferol (VITAMIN D3) tablet 2,000 Units  2,000 Units Oral Q lunch Lacretia Leigh, MD      . famotidine (PEPCID) tablet 20 mg  20 mg Oral QHS PRN Lacretia Leigh, MD      . finasteride (PROSCAR) tablet 5 mg  5 mg Oral Q lunch Lacretia Leigh, MD      . FLUoxetine (PROZAC) capsule 10 mg  10 mg Oral QHS Lacretia Leigh, MD      . Derrill Memo ON 06/12/2020] fluticasone (FLONASE) 50 MCG/ACT nasal spray 1-2 spray  1-2 spray Each Nare Daily Lacretia Leigh, MD      . Derrill Memo ON 06/12/2020] furosemide (LASIX) tablet 40 mg  40 mg Oral Daily Lacretia Leigh, MD   40 mg at 06/20/2020 1517  . metoprolol tartrate (LOPRESSOR) tablet 12.5 mg  12.5 mg Oral TID Lacretia Leigh, MD   12.5 mg at 07/04/2020 1517  .  mometasone-formoterol (DULERA) 200-5 MCG/ACT inhaler 2 puff  2 puff Inhalation BID Lacretia Leigh, MD      . nitroGLYCERIN (NITROSTAT) SL tablet 0.4 mg  0.4 mg Sublingual Q5 min PRN Lacretia Leigh, MD      . oxyCODONE (Oxy IR/ROXICODONE) immediate release tablet 2.5 mg  2.5 mg Oral Q6H PRN Lacretia Leigh, MD      . sodium chloride (MURO 128) 5 % ophthalmic solution 1 drop  1 drop Both Eyes QID Lacretia Leigh, MD   1 drop at 07/07/2020 1516  . [START ON 06/12/2020] umeclidinium bromide (INCRUSE ELLIPTA) 62.5 MCG/INH 1 puff  1 puff Inhalation Daily Lacretia Leigh, MD      . vitamin B-12 (CYANOCOBALAMIN) tablet 1,000 mcg  1,000 mcg Oral Daily Lacretia Leigh, MD       Current Outpatient Medications  Medication Sig Dispense Refill  . acetaminophen (TYLENOL) 500 MG tablet Take 500 mg by mouth every 6 (six) hours as needed for moderate pain.    Marland Kitchen ALPRAZolam (XANAX) 0.5 MG tablet Take 1 tablet (0.5 mg total) by mouth 3 (three) times daily as needed for anxiety. 3 tablet 0  . aspirin 81 MG tablet Take 81 mg by mouth daily.    Marland Kitchen atorvastatin (LIPITOR) 40 MG tablet Take 1 tablet (40 mg total) by mouth at bedtime. 30 tablet 0  . calcium carbonate (TUMS - DOSED IN MG ELEMENTAL CALCIUM) 500 MG chewable tablet Chew 1 tablet (200 mg of elemental calcium total) by mouth 2 (two) times daily as needed for indigestion or heartburn.    . Cholecalciferol (VITAMIN D3) 50 MCG (2000 UT) TABS Take 2,000 Units by mouth daily with lunch. 30 tablet 0  . COLLAGEN MATRIX-SILVER EX Apply 1 tablet topically every other day.    . famotidine (PEPCID) 20 MG tablet Take 20 mg by mouth at bedtime as needed for heartburn or indigestion.    . finasteride (PROSCAR) 5 MG tablet Take 1 tablet (5 mg total) by mouth daily with lunch. 30 tablet 0  . FLUoxetine (PROZAC) 10 MG capsule Take 10 mg by mouth at bedtime.    . fluticasone (FLONASE) 50 MCG/ACT nasal spray Place 1-2 sprays into both nostrils daily.    . furosemide (LASIX) 20 MG tablet  Take 40-80 mg by mouth See admin instructions. In 20mg  tablets, take 40mg  daily and 80mg  PRN    . guaiFENesin (MUCINEX) 600 MG 12 hr tablet Take 1 tablet (600 mg total) by mouth 2 (two) times daily. (Patient not taking: No sig reported) 60 tablet  0  . levalbuterol (XOPENEX HFA) 45 MCG/ACT inhaler Inhale 2 puffs into the lungs every 4 (four) hours as needed for wheezing or shortness of breath. 15 g 5  . levalbuterol (XOPENEX) 0.63 MG/3ML nebulizer solution Take 3 mLs (0.63 mg total) by nebulization every 4 (four) hours as needed for wheezing or shortness of breath. 120 mL 3  . metoprolol tartrate (LOPRESSOR) 25 MG tablet Take 0.5 tablets (12.5 mg total) by mouth 3 (three) times daily. 90 tablet 0  . Multiple Vitamins-Minerals (PRESERVISION AREDS 2) CAPS Take 1 capsule by mouth daily.    Marland Kitchen NITROSTAT 0.4 MG SL tablet Place 0.4 mg under the tongue every 5 (five) minutes as needed for chest pain.    Marland Kitchen oxyCODONE (OXY IR/ROXICODONE) 5 MG immediate release tablet Take 0.5 tablets (2.5 mg total) by mouth every 6 (six) hours as needed for breakthrough pain. 3 tablet 0  . OXYGEN Inhale 2 L/min into the lungs continuous.     . pantoprazole (PROTONIX) 40 MG tablet TAKE 1 TABLET(40 MG) BY MOUTH DAILY 30 TO 60 MINUTES BEFORE FIRST MEAL OF THE DAY (Patient not taking: No sig reported) 30 tablet 3  . Polyethyl Glycol-Propyl Glycol (SYSTANE) 0.4-0.3 % SOLN Apply 1 drop to eye 2 (two) times daily as needed (eye irritation). (Patient taking differently: Apply 1 drop to eye in the morning, at noon, and at bedtime.)  0  . polyethylene glycol (MIRALAX / GLYCOLAX) 17 g packet Take 17 g by mouth daily. (Patient taking differently: Take 17 g by mouth daily as needed for moderate constipation.) 14 each 0  . predniSONE (DELTASONE) 10 MG tablet Take 1 tablet (10 mg total) by mouth daily. 30 tablet 5  . sodium chloride (MURO 128) 5 % ophthalmic solution Place 1 drop into both eyes 4 (four) times daily. 15 mL   . SPIRIVA RESPIMAT  2.5 MCG/ACT AERS INHALE 2 PUFFS BY MOUTH EVERY DAY (Patient taking differently: Take 2 puffs by mouth daily.) 4 g 11  . SYMBICORT 160-4.5 MCG/ACT inhaler Inhale 2 puffs into the lungs 2 (two) times daily. 1 each 5  . vitamin B-12 (CYANOCOBALAMIN) 1000 MCG tablet Take 1 tablet (1,000 mcg total) by mouth daily. 30 tablet 0  . Vitamins A & D (SWEEN EX) Apply 1 application topically daily.       Discharge Medications: Please see discharge summary for a list of discharge medications.  Relevant Imaging Results:  Relevant Lab Results:   Additional Information [id number]                        *  Pt uses a Trilogy BiPap nightly  Roshini Fulwider, LCSW

## 2020-06-11 NOTE — Progress Notes (Signed)
Spoke to spouse [name] by phone. Patient has a Trillogy/BiPap from home and needs to go to a SNF that will accept the patient with a Trillogy. Aneta Mins Transition of Care Supervisor [phone]

## 2020-06-11 NOTE — ED Provider Notes (Signed)
[facility] EMERGENCY DEPARTMENT Provider Note   CSN: [id number] Arrival date & time:        History Chief Complaint  Patient presents with  . Shortness of Breath    Pt d/ced from here 2d ago for Right AKA, went to [facility] and they have now decided that they cannot keep him because he has to use his triology machine for breathing at night and they have no one at their facility trained on the trilogy machine, [facility] told EMS that the [place] will not allow them to keep him    ALIE HARDGROVE is a 87 y.o. male.  87 year old male who presents from rehab facility for nursing home placement due to the facility not being able to accommodate his home nighttime BiPAP machine.  Was discharged from the hospital just yesterday.  Has no new complaints at this time.  According to the wife, they have tried to work with social work without         Past Medical History:  Diagnosis Date  . Anxiety   . Arthritis    "generalized" (04/04/2017)  . Asthma   . Atrial fibrillation ([place])   . Basal cell carcinoma    "left ear"  . BPH (benign prostatic hyperplasia)    severe; s/p multiple biopsies  . CAD (coronary artery disease)   . Carotid artery disease ([place])   . Chronic diastolic CHF (congestive heart failure) ([place]) 2019  . Chronic pain 04/08/2020  . Chronic respiratory failure ([place])   . Chronic rhinitis   . COPD (chronic obstructive pulmonary disease) (HCC)    2L [place] O2  . Depression   . Dilation of biliary tract   . Elevated troponin 03/09/2017  . Fall 04/08/2020  . Gallstones   . GERD (gastroesophageal reflux disease)   . History of blood transfusion    "w/his CABG" (04/04/2017)  . History of kidney stones   . Hyperlipidemia   . Hypertension   . On home oxygen therapy    "~ 24/7" (04/04/2017)  . Peripheral vascular disease ([place])   . Pneumonia 2019  . RBBB   . Squamous cell carcinoma of skin 04/23/2013   in situ-Right flank (txpbx)  . Squamous cell  carcinoma of skin 01/23/2017   [place]/ in siut- Left ear rim (CX3FU)  . Syncope and collapse   . Ureteral tumor 08/2015   had endoscopic procedure for evaluation, unable to reach for biopsy  . Urinary catheter (Foley) change required     Patient Active Problem List   Diagnosis Date Noted  . Hypotension 06/05/2020  . Atrial fibrillation ([place])   . Atrial fibrillation with RVR ([place]) 04/08/2020  . Fall at home, initial encounter 04/08/2020  . Chronic pain 04/08/2020  . [place] 02/27/2020  . Chronic peripheral venous hypertension with lower extremity complication 37/90/2409  . Complete traumatic amputation at level between right hip and knee, initial encounter ([place]) 02/27/2020  . Difficulty in walking, not elsewhere classified 02/27/2020  . Dyspepsia 02/27/2020  . Foot ulcer ([place]) 02/27/2020  . Hyperglycemia 02/27/2020  . Iron deficiency anemia 02/27/2020  . Major depression single episode, in partial remission ([place]) 02/27/2020  . Malnutrition related to chronic disease ([place]) 02/27/2020  . Osteomyelitis ([place]) 02/27/2020  . Paresthesia 02/27/2020  . Right upper quadrant pain 02/27/2020  . Slow transit constipation 02/27/2020  . Spasm of bladder 02/27/2020  . Urinary tract obstruction 02/27/2020  . Vitamin D deficiency 02/27/2020  . Congestive heart failure ([place]) 02/27/2020  .  Prediabetes   . Urinary retention   . Normocytic anemia   . Chronic diastolic congestive heart failure ([place])   . Major depressive disorder with current active episode   . Paroxysmal atrial fibrillation (HCC)   . Right above-knee amputee ([place]) 02/05/2020  . Typical atrial flutter ([place])   . New onset atrial fibrillation ([place])   . Malnutrition of moderate degree 01/30/2020  . Cellulitis of left foot 01/24/2020  . Cellulitis and abscess of left lower extremity 01/24/2020  . Abnormality of gait 10/20/2019  . Pain of left heel 09/01/2019  . Labile blood pressure   . Anxiety state   . Hypothyroidism   .  Right hip pain   . Sacral decubitus ulcer   . Post-operative pain   . Chronic systolic congestive heart failure ([place])   . Urinary retention due to benign prostatic hyperplasia 08/20/2019  . SOB (shortness of breath)   . Chronic systolic CHF (congestive heart failure) ([place])   . Hypoalbuminemia due to protein-calorie malnutrition ([place])   . Transaminitis   . Acute blood loss anemia   . Hyponatremia   . Postoperative pain   . Supplemental oxygen dependent   . S/P AKA (above knee amputation) ([place]) 08/14/2019  . Peripheral arterial disease ([place]) 08/11/2019  . Abnormal urinalysis 08/11/2019  . Critical lower limb ischemia ([place]) 08/11/2019  . DNR (do not resuscitate)   . Voiding difficulty   . Palliative care by specialist   . Pressure injury of skin 06/29/2019  . Cellulitis of left lower extremity 06/28/2019  . Cellulitis of right lower extremity 06/27/2019  . AKI (acute kidney injury) ([place]) 06/27/2019  . Headache 10/16/2018  . Severe major depression without psychotic features ([place]) 09/22/2017  . Generalized anxiety disorder 09/22/2017  . DNR (do not resuscitate) discussion 05/10/2017  . Weakness generalized 04/04/2017  . Chest pain 03/24/2017  . AV block, [place] 1 03/24/2017  . CAD (coronary artery disease) 03/24/2017  . Chronic diastolic CHF (congestive heart failure) ([place]) 03/24/2017  . COPD (chronic obstructive pulmonary disease) ([place]) 03/24/2017  . Demand myocardial infarction ([place])   . Elevated troponin 03/09/2017  . Acute on chronic diastolic heart failure ([place])   . Tachycardia 03/08/2017  . Influenza A 02/04/2017  . Cor pulmonale, chronic (HCC)/ clinical dx  09/13/2016  . Orthostatic hypotension 09/26/2015  . Angina at rest [facility]) 09/24/2015  . BPH (benign prostatic hyperplasia) 09/13/2015  . GERD (gastroesophageal reflux disease) 09/13/2015  . Anxiety disorder 09/13/2015  . Sinusitis, chronic 09/09/2015  . DOE (dyspnea on exertion) 04/15/2015  . Acute heart failure  ([place]) 03/18/2015  . Right calf pain 06/18/2014  . Coronary artery disease involving coronary bypass graft of native heart with angina pectoris ([place])   . Peripheral vascular disease ([place])   . Essential hypertension   . Obstructive sleep apnea   . Rhinitis, chronic 01/29/2007  . COPD   GOLD IV/ 02/steroid dep  01/29/2007  . Chronic respiratory failure with hypoxia and hypercapnia ([place]) 01/29/2007  . Hyperlipidemia     Past Surgical History:  Procedure Laterality Date  . ABDOMINAL AORTOGRAM W/LOWER EXTREMITY Bilateral 06/30/2019   Procedure: ABDOMINAL AORTOGRAM W/LOWER EXTREMITY;  Surgeon: Waynetta Sandy, MD;  Location: [facility];  Service: Cardiovascular;  Laterality: Bilateral;  . AMPUTATION Right 08/11/2019   Procedure: RIGHT ABOVE KNEE AMPUTATION;  Surgeon: Angelia Mould, MD;  Location: [facility] OR;  Service: Vascular;  Laterality: Right;  . AMPUTATION Right 01/27/2020   Procedure: RIGHT AMPUTATION ABOVE KNEE REVISION;  Surgeon: Waynetta Sandy, MD;  Location: [facility];  Service: Vascular;  Laterality: Right;  . BASAL CELL CARCINOMA EXCISION Left    ear  . CARDIAC CATHETERIZATION     "prior to bypass"  . CATARACT EXTRACTION W/ INTRAOCULAR LENS  IMPLANT, BILATERAL Bilateral   . CORONARY ARTERY BYPASS GRAFT  10/2001   LIMA to LAD, SVG to OM1-2, SVG to RCA and PDA  . LITHOTRIPSY    . PROSTATE BIOPSY  10/2012   had bleeding after surgery.  . TONSILLECTOMY    . TRANSURETHRAL RESECTION OF BLADDER TUMOR N/A 04/06/2020   Procedure: TRANSURETHRAL RESECTION OF BLADDER TUMOR (TURBT)/ POST OPERATIVE INSTILLATION OF GEMCITABINE;  Surgeon: Festus Aloe, MD;  Location: [facility] OR[facility];  Service: Urology;  Laterality: N/A;       Family History  Problem Relation Age of Onset  . Heart disease Mother   . Heart disease Father        Before age 77  . Breast cancer Mother   . Cancer Mother        Breast cancer  . Hypertension Mother   . Other Mother        AAA  and    Amputation  . Heart attack Father   . Stroke Brother        x3, still living   . Peripheral vascular disease Brother   . Heart disease Brother   . Hyperlipidemia Brother   . Hypertension Brother   . Heart disease Brother   . Allergies Brother     Social History   Tobacco Use  . Smoking status: Former Smoker    Packs/day: 2.00    Years: 33.00    Pack years: 66.00    Types: Cigarettes    Quit date: 03/25/1982    Years since quitting: 38.2  . Smokeless tobacco: Never Used  Vaping Use  . Vaping Use: Never used  Substance Use Topics  . Alcohol use: Yes    Alcohol/week: 0.0 standard drinks    Comment: 1.5-3 oz daily in the past, minimal use in the last few months  . Drug use: No    Home Medications Prior to Admission medications   Medication Sig Start Date End Date Taking? Authorizing Provider  acetaminophen (TYLENOL) 500 MG tablet Take 500 mg by mouth every 6 (six) hours as needed for moderate pain.    [provider]  ALPRAZolam Duanne Moron) 0.5 MG tablet Take 1 tablet (0.5 mg total) by mouth 3 (three) times daily as needed for anxiety. 06/10/20   Hosie Poisson, MD  aspirin 81 MG tablet Take 81 mg by mouth daily.    [provider]  atorvastatin (LIPITOR) 40 MG tablet Take 1 tablet (40 mg total) by mouth at bedtime. 06/10/20   Hosie Poisson, MD  calcium carbonate (TUMS - DOSED IN MG ELEMENTAL CALCIUM) 500 MG chewable tablet Chew 1 tablet (200 mg of elemental calcium total) by mouth 2 (two) times daily as needed for indigestion or heartburn. 09/01/19   Love, Ivan Anchors, PA-C  Cholecalciferol (VITAMIN D3) 50 MCG (2000 UT) TABS Take 2,000 Units by mouth daily with lunch. 02/16/20   Angiulli, Lavon Paganini, PA-C  COLLAGEN MATRIX-SILVER EX Apply 1 tablet topically every other day.    [provider]  famotidine (PEPCID) 20 MG tablet Take 20 mg by mouth at bedtime as needed for heartburn or indigestion.    [provider]  finasteride (PROSCAR) 5 MG tablet Take 1  tablet (5 mg total) by mouth  daily with lunch. 02/16/20   Angiulli, Lavon Paganini, PA-C  FLUoxetine (PROZAC) 10 MG capsule Take 10 mg by mouth at bedtime. 05/26/20   [provider]  fluticasone (FLONASE) 50 MCG/ACT nasal spray Place 1-2 sprays into both nostrils daily.    [provider]  furosemide (LASIX) 20 MG tablet Take 40-80 mg by mouth See admin instructions. In 20mg  tablets, take 40mg  daily and 80mg  PRN    [provider]  guaiFENesin (MUCINEX) 600 MG 12 hr tablet Take 1 tablet (600 mg total) by mouth 2 (two) times daily. Patient not taking: No sig reported 02/16/20   Angiulli, Lavon Paganini, PA-C  levalbuterol [facility] HFA) 45 MCG/ACT inhaler Inhale 2 puffs into the lungs every 4 (four) hours as needed for wheezing or shortness of breath. 02/16/20   Angiulli, Lavon Paganini, PA-C  levalbuterol (XOPENEX) 0.63 MG/3ML nebulizer solution Take 3 mLs (0.63 mg total) by nebulization every 4 (four) hours as needed for wheezing or shortness of breath. 10/13/19   Tanda Rockers, MD  metoprolol tartrate (LOPRESSOR) 25 MG tablet Take 0.5 tablets (12.5 mg total) by mouth 3 (three) times daily. 06/10/20   Hosie Poisson, MD  Multiple Vitamins-Minerals (PRESERVISION AREDS 2) CAPS Take 1 capsule by mouth daily.    [provider]  NITROSTAT 0.4 MG SL tablet Place 0.4 mg under the tongue every 5 (five) minutes as needed for chest pain.    [provider]  oxyCODONE (OXY IR/ROXICODONE) 5 MG immediate release tablet Take 0.5 tablets (2.5 mg total) by mouth every 6 (six) hours as needed for breakthrough pain. 06/10/20   Hosie Poisson, MD  OXYGEN Inhale 2 L/min into the lungs continuous.     [provider]  pantoprazole (PROTONIX) 40 MG tablet TAKE 1 TABLET(40 MG) BY MOUTH DAILY 30 TO 60 MINUTES BEFORE FIRST MEAL OF THE DAY Patient not taking: No sig reported 02/16/20   Angiulli, Lavon Paganini, PA-C  Polyethyl Glycol-Propyl Glycol (SYSTANE) 0.4-0.3 % SOLN Apply 1 drop to eye 2 (two) times  daily as needed (eye irritation). Patient taking differently: Apply 1 drop to eye in the morning, at noon, and at bedtime. 09/01/19   Love, Ivan Anchors, PA-C  polyethylene glycol (MIRALAX / GLYCOLAX) 17 g packet Take 17 g by mouth daily. Patient taking differently: Take 17 g by mouth daily as needed for moderate constipation. 09/01/19   Love, Ivan Anchors, PA-C  predniSONE (DELTASONE) 10 MG tablet Take 1 tablet (10 mg total) by mouth daily. 05/06/20   Tanda Rockers, MD  sodium chloride (MURO 128) 5 % ophthalmic solution Place 1 drop into both eyes 4 (four) times daily. 08/22/19   Love, Ivan Anchors, PA-C  SPIRIVA RESPIMAT 2.5 MCG/ACT AERS INHALE 2 PUFFS BY MOUTH EVERY DAY Patient taking differently: Take 2 puffs by mouth daily. 05/17/20   Tanda Rockers, MD  SYMBICORT 160-4.5 MCG/ACT inhaler Inhale 2 puffs into the lungs 2 (two) times daily. 02/16/20   Angiulli, Lavon Paganini, PA-C  vitamin B-12 (CYANOCOBALAMIN) 1000 MCG tablet Take 1 tablet (1,000 mcg total) by mouth daily. 02/16/20   Angiulli, Lavon Paganini, PA-C  Vitamins A & D (SWEEN EX) Apply 1 application topically daily.    [provider]    Allergies    Cefdinir, Nitrofurantoin, Sulfa antibiotics, Sulfonamide derivatives, Tape, Ciprofloxacin, Nitrofurantoin, Amoxicillin er, Doxycycline, Keflex [cephalexin], Mirtazapine, Ranolazine, Chlorhexidine gluconate, Levaquin [levofloxacin], and Sertraline  Review of Systems   Review of Systems  All other systems reviewed and are negative.  Physical Exam Updated Vital Signs BP 128/70   Pulse 82   Temp 98.6 F (37 C) (Oral)   Resp 16   Ht 1.727 m (5\' 8" )   Wt 57.5 kg   SpO2 97%   BMI 19.27 kg/m   Physical Exam Vitals and nursing note reviewed.  Constitutional:      General: He is not in acute distress.    Appearance: Normal appearance. He is well-developed. He is not toxic-appearing.  HENT:     Head: Normocephalic and atraumatic.  Eyes:     General: Lids are normal.     Conjunctiva/sclera:  Conjunctivae normal.     Pupils: Pupils are equal, round, and reactive to light.  Neck:     Thyroid: No thyroid mass.     Trachea: No tracheal deviation.  Cardiovascular:     Rate and Rhythm: Normal rate and regular rhythm.     Heart sounds: Normal heart sounds. No murmur heard. No gallop.   Pulmonary:     Effort: Pulmonary effort is normal. No respiratory distress.     Breath sounds: Normal breath sounds. No stridor. No decreased breath sounds, wheezing, rhonchi or rales.  Abdominal:     General: Bowel sounds are normal. There is no distension.     Palpations: Abdomen is soft.     Tenderness: There is no abdominal tenderness. There is no rebound.  Musculoskeletal:        General: No tenderness. Normal range of motion.     Cervical back: Normal range of motion and neck supple.  Skin:    General: Skin is warm and dry.     Findings: No abrasion or rash.  Neurological:     Mental Status: He is alert and oriented to person, place, and time. Mental status is at baseline.     GCS: GCS eye subscore is 4. GCS verbal subscore is 5. GCS motor subscore is 6.  Psychiatric:        Attention and Perception: Attention normal.     ED Results / Procedures / Treatments   Labs (all labs ordered are listed, but only abnormal results are displayed) Labs Reviewed - No data to display  EKG None  Radiology No results found.  Procedures Procedures   Medications Ordered in ED Medications - No data to display  ED Course  I have reviewed the triage vital signs and the nursing notes.  Pertinent labs & imaging results that were available during my care of the patient were reviewed by me and considered in my medical decision making (see chart for details).    MDM Rules/Calculators/A&P                          Contact case management to arrange placement Final Clinical Impression(s) / ED Diagnoses Final diagnoses:  None    Rx / DC Orders ED Discharge Orders    None       Lacretia Leigh, MD 06/18/2020 1331

## 2020-06-11 NOTE — Progress Notes (Signed)
CSW met with Pt and family at bedside. CSW explained that due to Pt utilizing Trilogy BiPap placement will be more challenging.  CSW explained that several SNFs had been contacted and that some had already declined due to Trilogy.  CSW informed family that [facility] is willing to review.    CSW spoke with [place] who states they have no beds available until Wed. Or Thurs of next week.

## 2020-06-11 NOTE — ED Notes (Signed)
[name] has voiced complaints about her husband having to be in hall bed 15.  Myself and Dr. Zenia Resides have explained to her why this is necessary.  She is upset and states it is cruel. I have notified Clark - RN charge and he states he will come talk to her when he gets a chance. I updated [name]

## 2020-06-11 NOTE — ED Notes (Signed)
Not able to obtain MSE E-sig due to pt is in hall bed and portable computer with signature pad not working

## 2020-06-12 DIAGNOSIS — F419 Anxiety disorder, unspecified: Secondary | ICD-10-CM | POA: Diagnosis not present

## 2020-06-12 DIAGNOSIS — R0602 Shortness of breath: Secondary | ICD-10-CM | POA: Diagnosis not present

## 2020-06-12 MED ORDER — GUAIFENESIN ER 600 MG PO TB12
600.0000 mg | ORAL_TABLET | Freq: Two times a day (BID) | ORAL | Status: DC
  Administered 2020-06-12 – 2020-06-21 (×20): 600 mg via ORAL
  Filled 2020-06-12 (×20): qty 1

## 2020-06-12 MED ORDER — DOCUSATE SODIUM 100 MG PO CAPS
100.0000 mg | ORAL_CAPSULE | Freq: Every day | ORAL | Status: DC
  Administered 2020-06-12 – 2020-06-14 (×3): 100 mg via ORAL
  Filled 2020-06-12 (×3): qty 1

## 2020-06-12 MED ORDER — POLYETHYLENE GLYCOL 3350 17 G PO PACK
17.0000 g | PACK | Freq: Every day | ORAL | Status: DC
  Administered 2020-06-12 – 2020-06-18 (×6): 17 g via ORAL
  Filled 2020-06-12 (×8): qty 1

## 2020-06-12 MED ORDER — FERROUS SULFATE 325 (65 FE) MG PO TABS
325.0000 mg | ORAL_TABLET | Freq: Every day | ORAL | Status: DC
  Administered 2020-06-15 – 2020-06-20 (×6): 325 mg via ORAL
  Filled 2020-06-12 (×7): qty 1

## 2020-06-12 MED ORDER — UMECLIDINIUM BROMIDE 62.5 MCG/INH IN AEPB
1.0000 | INHALATION_SPRAY | Freq: Every day | RESPIRATORY_TRACT | Status: DC
  Filled 2020-06-12: qty 7

## 2020-06-12 MED ORDER — BACITRACIN ZINC 500 UNIT/GM EX OINT
TOPICAL_OINTMENT | CUTANEOUS | Status: DC | PRN
  Filled 2020-06-12: qty 28.4

## 2020-06-12 MED ORDER — PANTOPRAZOLE SODIUM 40 MG PO TBEC
40.0000 mg | DELAYED_RELEASE_TABLET | Freq: Every day | ORAL | Status: DC
  Administered 2020-06-12 – 2020-06-21 (×10): 40 mg via ORAL
  Filled 2020-06-12 (×10): qty 1

## 2020-06-12 MED ORDER — PREDNISONE 20 MG PO TABS
10.0000 mg | ORAL_TABLET | Freq: Every day | ORAL | Status: DC
  Administered 2020-06-12 – 2020-06-13 (×2): 10 mg via ORAL
  Filled 2020-06-12 (×2): qty 1

## 2020-06-12 MED ORDER — LEVALBUTEROL TARTRATE 45 MCG/ACT IN AERO: 2.0000 | INHALATION_SPRAY | RESPIRATORY_TRACT | Status: DC | PRN

## 2020-06-12 MED ORDER — FUROSEMIDE 20 MG PO TABS
20.0000 mg | ORAL_TABLET | Freq: Two times a day (BID) | ORAL | Status: DC
  Administered 2020-06-12 – 2020-06-13 (×2): 20 mg via ORAL
  Filled 2020-06-12 (×2): qty 1

## 2020-06-12 NOTE — ED Notes (Signed)
Breakfast tray ordered 

## 2020-06-12 NOTE — ED Notes (Signed)
Patient's spouse requesting to speak with house supervisor regarding patient's care. House supervisor made aware.

## 2020-06-12 NOTE — ED Notes (Signed)
Care assumed. Pt resting with eyes closed at this time with trilogy bipap in place.

## 2020-06-12 NOTE — Evaluation (Signed)
Physical Therapy Evaluation Patient Details Name: [name] MRN: [id number] DOB: September 02, 1929 Today's Date: 06/12/2020   History of Present Illness  87 y.o. male admitted 06/04/20 with chest pain; workup for NSTEMI.  Was seen and sent to SNF, and was refused care in SNF when he was sent over with his Trilogy breathing machine.  Pt also with chronic LLE and coccyx wounds. PMH includes CAD (s/p CABG 2003), HF, severe COPD on chronic prednisone therapy, chronic hypoxic respiratory failure (2L O2 baseline), PVD s/p R AKA, HTN, PAF, anemia, chronic foley, depression.  Clinical Impression  Pt was seen for mobiltiy on side of bed and to do standing practice, but is generally weak and struggling to stay on side of bed.  Pt asked to remain sitting on side of bed but could not determine that to be a safe choice.  Follow up with him to progress mobility as initiated to get to SNF and make progress on independent transfers as prior to hosp. Pt was just sent to SNF but rejected immediately back to hosp for being on a breathing machine at night.  Will focus on standing and pivoting skills,and nursing has obtained a recliner to get pt OOB and start building his endurance.  Pt is a good SNF candidate given his willingness to work and PLOF.    Follow Up Recommendations SNF    Equipment Recommendations  None recommended by PT    Recommendations for Other Services       Precautions / Restrictions Precautions Precautions: Fall;Other (comment) Precaution Comments: Fragile skin with chronic LLE/sacral wounds; h/o R AKA; baseline 2L O2; chronic foley Required Braces or Orthoses: Other Brace Other Brace: L foot shoe Restrictions Weight Bearing Restrictions: Yes RLE Weight Bearing: Non weight bearing      Mobility  Bed Mobility Overal bed mobility: Needs Assistance Bed Mobility: Supine to Sit;Sit to Supine     Supine to sit: Mod assist Sit to supine: Mod assist        Transfers Overall transfer  level: Needs assistance Equipment used: 1 person hand held assist Transfers: Sit to/from Stand Sit to Stand: Min assist;Mod assist         General transfer comment: min assist to maintain standing balance but mod to initiate and power up  Ambulation/Gait             General Gait Details: unable to stand  Stairs            Wheelchair Mobility    Modified Rankin (Stroke Patients Only)       Balance Overall balance assessment: Needs assistance Sitting-balance support: No upper extremity supported;Feet unsupported Sitting balance-Leahy Scale: Fair Sitting balance - Comments: leaning forward on side of gurney, asking to stay there     Standing balance-Leahy Scale: Poor                               Pertinent Vitals/Pain Pain Assessment: Faces Faces Pain Scale: Hurts little more Pain Location: mainly skin on sacral area Pain Descriptors / Indicators: Guarding Pain Intervention(s): Limited activity within patient's tolerance;Monitored during session;Premedicated before session;Repositioned    Home Living Family/patient expects to be discharged to:: Private residence Living Arrangements: Spouse/significant other Available Help at Discharge: Family;Available PRN/intermittently Type of Home: House Home Access: Level entry     Home Layout: Multi-level;Bed/bath upstairs Home Equipment: Walker - 2 wheels;Cane - single point;Shower seat;Transport chair;Tub bench;Other (comment) Additional Comments: wife unable to  lift pt and has a daughter to help    Prior Function Level of Independence: Needs assistance   Gait / Transfers Assistance Needed: per wife transfers were independent until recently  ADL's / 13 Assistance Needed: wife assists personal care  Comments: Wears 2L O2 baseline     Hand Dominance   Dominant Hand: Right    Extremity/Trunk Assessment   Upper Extremity Assessment Upper Extremity Assessment: Generalized weakness     Lower Extremity Assessment Lower Extremity Assessment: Generalized weakness RLE Deficits / Details: H/o R AKA LLE Deficits / Details: L LE wound    Cervical / Trunk Assessment Cervical / Trunk Assessment: Kyphotic  Communication   Communication: HOH  Cognition Arousal/Alertness: Lethargic Behavior During Therapy: Flat affect Overall Cognitive Status:  (wife reports he is cognitively intact but chart states impaired at baseline) Area of Impairment: Safety/judgement;Awareness;Problem solving;Following commands                   Current Attention Level: Selective   Following Commands: Follows one step commands inconsistently;Follows one step commands with increased time Safety/Judgement: Decreased awareness of safety;Decreased awareness of deficits Awareness: Intellectual Problem Solving: Slow processing;Requires verbal cues;Requires tactile cues General Comments: pt is  not willing to trust PT to help him for most of the session, finally agrees      General Comments General comments (skin integrity, edema, etc.): Pt is positioned on a Roho cushion to protect his skin on glut/sacral area    Exercises     Assessment/Plan    PT Assessment Patient needs continued PT services  PT Problem List Decreased strength;Decreased range of motion;Decreased activity tolerance;Decreased balance;Decreased mobility;Decreased coordination;Decreased cognition;Decreased knowledge of use of DME;Decreased safety awareness;Cardiopulmonary status limiting activity;Decreased skin integrity       PT Treatment Interventions DME instruction;Functional mobility training;Therapeutic activities;Therapeutic exercise;Balance training;Neuromuscular re-education;Patient/family education    PT Goals (Current goals can be found in the Care Plan section)  Acute Rehab PT Goals Patient Stated Goal: to get stronger PT Goal Formulation: With patient/family Time For Goal Achievement: 06/26/20 Potential to  Achieve Goals: Fair    Frequency Min 3X/week   Barriers to discharge Decreased caregiver support has only wife at home but does have mechanical equipment such as a chair lift    Co-evaluation               AM-PAC PT "6 Clicks" Mobility  Outcome Measure Help needed turning from your back to your side while in a flat bed without using bedrails?: None Help needed moving from lying on your back to sitting on the side of a flat bed without using bedrails?: A Little Help needed moving to and from a bed to a chair (including a wheelchair)?: A Little Help needed standing up from a chair using your arms (e.g., wheelchair or bedside chair)?: A Lot Help needed to walk in hospital room?: Total Help needed climbing 3-5 steps with a railing? : Total 6 Click Score: 14    End of Session Equipment Utilized During Treatment: Oxygen;Gait belt Activity Tolerance: Patient limited by fatigue Patient left: in bed;with call bell/phone within reach;with family/visitor present Nurse Communication: Mobility status PT Visit Diagnosis: Unsteadiness on feet (R26.81);Muscle weakness (generalized) (M62.81);Adult, failure to thrive (R62.7)    Time: 4098-1191 PT Time Calculation (min) (ACUTE ONLY): 31 min   Charges:   PT Evaluation $PT Eval Moderate Complexity: 1 Mod PT Treatments $Therapeutic Activity: 8-22 mins       Ramond Dial 06/12/2020, 6:04 PM Mee Hives, PT  MS Acute Rehab Dept. Number: [place] and [place]

## 2020-06-12 NOTE — ED Notes (Addendum)
Pt resting at this time. Spouse at bedside.

## 2020-06-12 NOTE — ED Notes (Signed)
Patient pulled up in bed and repositioned.

## 2020-06-12 NOTE — ED Notes (Signed)
Patient repositioned

## 2020-06-12 NOTE — ED Notes (Signed)
Patient's spouse expressing concern for the care of the patient while waiting for facility placement. The concerns expressed are as follows: wound care/dressing supplies and instructions; complete home medication administration; PT/OT; social work Clinical research associate. EDP made aware and is working on addressing concerns.

## 2020-06-12 NOTE — ED Notes (Signed)
Social work called to come see patient and speak with patient's spouse.

## 2020-06-12 NOTE — ED Notes (Addendum)
Medication list updated and missing medication administered. Patient repositioned. Wound dressing secured with paper tape on LLE.

## 2020-06-12 NOTE — TOC Transition Note (Signed)
Transition of Care [facility]) - CM/SW Discharge Note   Patient Details  Name: [name] MRN: [id number] Date of Birth: March 29, 1929  [facility]) CM/SW Contact:  Oretha Milch, LCSW Phone Number: 06/12/2020, 3:58 PM   Clinical Narrative:    CSW contacted by RN that patient's wife wanted to speak with CSW. CSW met with patient and provided update on SNF referral process. CSW noted wife was open to referrals for additional SNFs further out from [place] if they were high rated. CSW processed patient's concerns regarding and discussed sending clarification to facilities of trilogy and wound care needs. CSW provided wife with CSW ED phone for further questions and will continue to follow for discharge supports.      Barriers to Discharge: SNF Pending bed offer   Patient Goals and CMS Choice        Discharge Placement                       Discharge Plan and Services                                     Social Determinants of Health (SDOH) Interventions     Readmission Risk Interventions No flowsheet data found.

## 2020-06-12 NOTE — ED Notes (Signed)
Patient's wound on LLE cleaned with saline, covered with xeroform, covered with a non-stick dressing and wrapped with rolled gauze.

## 2020-06-12 NOTE — ED Notes (Signed)
Patient resting with eyes closed

## 2020-06-12 NOTE — ED Notes (Signed)
Pt transferred to recliner and leg elevated. No further needs at this time.

## 2020-06-12 NOTE — ED Provider Notes (Signed)
Emergency Medicine Observation Re-evaluation Note  [name] is a 87 y.o. male, seen on rounds today.  Pt initially presented to the ED for complaints of Facility Placement Currently, the patient is awaiting SNF placement, unable to stay at previous facility due to his nighttime BiPAP machine.  Physical Exam  BP (!) 91/48   Pulse (!) 59   Temp 98.8 F (37.1 C) (Oral)   Resp 14   Ht 5\' 8"  (1.727 m)   Wt 57.5 kg   SpO2 98%   BMI 19.27 kg/m  Physical Exam General: alert, sitting up, eating lunch Cardiac: RRR Lungs: CTA b/l, normal WOB Psych: calm, cooperative Skin: large deep ulceration on medial L lower leg with no active drainage, no surrounding erythema, clean  ED Course / MDM  EKG:   I have reviewed the labs performed to date as well as medications administered while in observation.  Recent changes in the last 24 hours include: I placed wound care team consult; adjusted home med list to reflect most recent med list from 06/10/20. Wife at bedside.  Plan  Current plan is for social work to assist for SNF placement. Patient is not under full IVC at this time.   Tanzania Basham, Wenda Overland, MD 06/12/20 1440

## 2020-06-13 MED ORDER — PROSIGHT PO TABS
1.0000 | ORAL_TABLET | Freq: Every day | ORAL | Status: DC
  Administered 2020-06-13 – 2020-06-20 (×8): 1 via ORAL
  Filled 2020-06-13 (×9): qty 1

## 2020-06-13 MED ORDER — FUROSEMIDE 40 MG PO TABS
40.0000 mg | ORAL_TABLET | Freq: Two times a day (BID) | ORAL | Status: DC
  Administered 2020-06-13 – 2020-06-14 (×2): 40 mg via ORAL
  Filled 2020-06-13 (×2): qty 2

## 2020-06-13 MED ORDER — PREDNISONE 10 MG PO TABS
10.0000 mg | ORAL_TABLET | Freq: Every day | ORAL | Status: DC
  Administered 2020-06-14 – 2020-06-17 (×4): 10 mg via ORAL
  Filled 2020-06-13 (×4): qty 1

## 2020-06-13 MED ORDER — POLYVINYL ALCOHOL 1.4 % OP SOLN
1.0000 [drp] | Freq: Three times a day (TID) | OPHTHALMIC | Status: DC
  Administered 2020-06-15: 1 [drp] via OPHTHALMIC
  Filled 2020-06-13 (×2): qty 15

## 2020-06-13 NOTE — ED Notes (Signed)
Pt transferred to hospital bed for comfort.

## 2020-06-13 NOTE — ED Notes (Signed)
Pt wife at bedside visiting with pt

## 2020-06-13 NOTE — ED Notes (Signed)
Pt repositioned

## 2020-06-13 NOTE — ED Notes (Signed)
Breakfast Ordered 

## 2020-06-14 ENCOUNTER — Emergency Department (HOSPITAL_COMMUNITY): Payer: Medicare Other

## 2020-06-14 ENCOUNTER — Telehealth: Payer: Self-pay | Admitting: Internal Medicine

## 2020-06-14 DIAGNOSIS — Z515 Encounter for palliative care: Secondary | ICD-10-CM | POA: Diagnosis not present

## 2020-06-14 DIAGNOSIS — F32A Depression, unspecified: Secondary | ICD-10-CM | POA: Diagnosis present

## 2020-06-14 DIAGNOSIS — E43 Unspecified severe protein-calorie malnutrition: Secondary | ICD-10-CM | POA: Diagnosis not present

## 2020-06-14 DIAGNOSIS — Z20822 Contact with and (suspected) exposure to covid-19: Secondary | ICD-10-CM | POA: Diagnosis not present

## 2020-06-14 DIAGNOSIS — E861 Hypovolemia: Secondary | ICD-10-CM | POA: Diagnosis not present

## 2020-06-14 DIAGNOSIS — Z7189 Other specified counseling: Secondary | ICD-10-CM | POA: Diagnosis not present

## 2020-06-14 DIAGNOSIS — I480 Paroxysmal atrial fibrillation: Secondary | ICD-10-CM | POA: Diagnosis present

## 2020-06-14 DIAGNOSIS — I5033 Acute on chronic diastolic (congestive) heart failure: Secondary | ICD-10-CM | POA: Diagnosis not present

## 2020-06-14 DIAGNOSIS — Z9981 Dependence on supplemental oxygen: Secondary | ICD-10-CM | POA: Diagnosis not present

## 2020-06-14 DIAGNOSIS — N400 Enlarged prostate without lower urinary tract symptoms: Secondary | ICD-10-CM | POA: Diagnosis present

## 2020-06-14 DIAGNOSIS — Z89611 Acquired absence of right leg above knee: Secondary | ICD-10-CM | POA: Diagnosis not present

## 2020-06-14 DIAGNOSIS — R531 Weakness: Secondary | ICD-10-CM | POA: Diagnosis not present

## 2020-06-14 DIAGNOSIS — J9811 Atelectasis: Secondary | ICD-10-CM | POA: Diagnosis not present

## 2020-06-14 DIAGNOSIS — I10 Essential (primary) hypertension: Secondary | ICD-10-CM | POA: Diagnosis not present

## 2020-06-14 DIAGNOSIS — F419 Anxiety disorder, unspecified: Secondary | ICD-10-CM | POA: Diagnosis not present

## 2020-06-14 DIAGNOSIS — Z66 Do not resuscitate: Secondary | ICD-10-CM | POA: Diagnosis not present

## 2020-06-14 DIAGNOSIS — I5041 Acute combined systolic (congestive) and diastolic (congestive) heart failure: Secondary | ICD-10-CM

## 2020-06-14 DIAGNOSIS — R0602 Shortness of breath: Secondary | ICD-10-CM | POA: Diagnosis not present

## 2020-06-14 DIAGNOSIS — J449 Chronic obstructive pulmonary disease, unspecified: Secondary | ICD-10-CM | POA: Diagnosis not present

## 2020-06-14 DIAGNOSIS — I2510 Atherosclerotic heart disease of native coronary artery without angina pectoris: Secondary | ICD-10-CM | POA: Diagnosis not present

## 2020-06-14 DIAGNOSIS — I509 Heart failure, unspecified: Secondary | ICD-10-CM

## 2020-06-14 DIAGNOSIS — F411 Generalized anxiety disorder: Secondary | ICD-10-CM | POA: Diagnosis present

## 2020-06-14 DIAGNOSIS — J9621 Acute and chronic respiratory failure with hypoxia: Secondary | ICD-10-CM | POA: Diagnosis not present

## 2020-06-14 DIAGNOSIS — I5032 Chronic diastolic (congestive) heart failure: Secondary | ICD-10-CM | POA: Diagnosis not present

## 2020-06-14 DIAGNOSIS — Z8249 Family history of ischemic heart disease and other diseases of the circulatory system: Secondary | ICD-10-CM | POA: Diagnosis not present

## 2020-06-14 DIAGNOSIS — Z978 Presence of other specified devices: Secondary | ICD-10-CM | POA: Diagnosis not present

## 2020-06-14 DIAGNOSIS — E559 Vitamin D deficiency, unspecified: Secondary | ICD-10-CM | POA: Diagnosis present

## 2020-06-14 DIAGNOSIS — I110 Hypertensive heart disease with heart failure: Secondary | ICD-10-CM | POA: Diagnosis not present

## 2020-06-14 DIAGNOSIS — R609 Edema, unspecified: Secondary | ICD-10-CM | POA: Diagnosis not present

## 2020-06-14 DIAGNOSIS — E039 Hypothyroidism, unspecified: Secondary | ICD-10-CM | POA: Diagnosis present

## 2020-06-14 DIAGNOSIS — Z823 Family history of stroke: Secondary | ICD-10-CM | POA: Diagnosis not present

## 2020-06-14 DIAGNOSIS — E873 Alkalosis: Secondary | ICD-10-CM | POA: Diagnosis present

## 2020-06-14 DIAGNOSIS — I739 Peripheral vascular disease, unspecified: Secondary | ICD-10-CM | POA: Diagnosis not present

## 2020-06-14 DIAGNOSIS — I222 Subsequent non-ST elevation (NSTEMI) myocardial infarction: Secondary | ICD-10-CM | POA: Diagnosis not present

## 2020-06-14 DIAGNOSIS — I214 Non-ST elevation (NSTEMI) myocardial infarction: Secondary | ICD-10-CM | POA: Diagnosis not present

## 2020-06-14 DIAGNOSIS — J90 Pleural effusion, not elsewhere classified: Secondary | ICD-10-CM | POA: Diagnosis not present

## 2020-06-14 DIAGNOSIS — D509 Iron deficiency anemia, unspecified: Secondary | ICD-10-CM | POA: Diagnosis not present

## 2020-06-14 DIAGNOSIS — E871 Hypo-osmolality and hyponatremia: Secondary | ICD-10-CM | POA: Diagnosis present

## 2020-06-14 LAB — CBC
HCT: 26.5 % — ABNORMAL LOW (ref 39.0–52.0)
Hemoglobin: 8.6 g/dL — ABNORMAL LOW (ref 13.0–17.0)
MCH: 30.4 pg (ref 26.0–34.0)
MCHC: 32.5 g/dL (ref 30.0–36.0)
MCV: 93.6 fL (ref 80.0–100.0)
Platelets: 298 10*3/uL (ref 150–400)
RBC: 2.83 MIL/uL — ABNORMAL LOW (ref 4.22–5.81)
RDW: 13.9 % (ref 11.5–15.5)
WBC: 9.5 10*3/uL (ref 4.0–10.5)
nRBC: 0 % (ref 0.0–0.2)

## 2020-06-14 MED ORDER — POLYETHYL GLYCOL-PROPYL GLYCOL 0.4-0.3 % OP SOLN: 1.0000 [drp] | Freq: Two times a day (BID) | OPHTHALMIC | Status: DC | PRN

## 2020-06-14 MED ORDER — POLYETHYLENE GLYCOL 3350 17 G PO PACK: 17.0000 g | PACK | Freq: Every day | ORAL | Status: DC

## 2020-06-14 MED ORDER — PRESERVISION AREDS 2 PO CAPS: 1.0000 | ORAL_CAPSULE | Freq: Every day | ORAL | Status: DC

## 2020-06-14 MED ORDER — DOCUSATE SODIUM 100 MG PO CAPS: 100.0000 mg | ORAL_CAPSULE | Freq: Every day | ORAL | Status: DC

## 2020-06-14 MED ORDER — GERHARDT'S BUTT CREAM
TOPICAL_CREAM | Freq: Two times a day (BID) | CUTANEOUS | Status: DC
  Administered 2020-06-19: 1 via TOPICAL
  Filled 2020-06-14 (×2): qty 1

## 2020-06-14 MED ORDER — PANTOPRAZOLE SODIUM 40 MG PO TBEC: 40.0000 mg | DELAYED_RELEASE_TABLET | Freq: Every day | ORAL | Status: DC

## 2020-06-14 MED ORDER — SODIUM CHLORIDE 0.9% FLUSH
3.0000 mL | Freq: Two times a day (BID) | INTRAVENOUS | Status: DC
  Administered 2020-06-14 – 2020-06-21 (×14): 3 mL via INTRAVENOUS

## 2020-06-14 MED ORDER — FUROSEMIDE 10 MG/ML IJ SOLN
40.0000 mg | Freq: Two times a day (BID) | INTRAMUSCULAR | Status: DC
  Administered 2020-06-14: 40 mg via INTRAVENOUS
  Filled 2020-06-14: qty 4

## 2020-06-14 MED ORDER — SODIUM CHLORIDE 0.9 % IV SOLN: 250.0000 mL | INTRAVENOUS | Status: DC | PRN

## 2020-06-14 MED ORDER — GUAIFENESIN ER 600 MG PO TB12: 600.0000 mg | ORAL_TABLET | Freq: Two times a day (BID) | ORAL | Status: DC

## 2020-06-14 MED ORDER — ONDANSETRON HCL 4 MG/2ML IJ SOLN: 4.0000 mg | Freq: Four times a day (QID) | INTRAMUSCULAR | Status: DC | PRN

## 2020-06-14 MED ORDER — FERROUS SULFATE 325 (65 FE) MG PO TABS: 325.0000 mg | ORAL_TABLET | Freq: Every day | ORAL | Status: DC

## 2020-06-14 MED ORDER — SODIUM CHLORIDE 0.9 % IV SOLN
500.0000 mg | Freq: Once | INTRAVENOUS | Status: AC
  Administered 2020-06-14: 500 mg via INTRAVENOUS
  Filled 2020-06-14: qty 25

## 2020-06-14 MED ORDER — MUPIROCIN 2 % EX OINT
TOPICAL_OINTMENT | Freq: Every day | CUTANEOUS | Status: DC
  Administered 2020-06-15 – 2020-06-18 (×2): 1 via TOPICAL
  Filled 2020-06-14 (×2): qty 22

## 2020-06-14 MED ORDER — DOCUSATE SODIUM 100 MG PO CAPS
100.0000 mg | ORAL_CAPSULE | Freq: Two times a day (BID) | ORAL | Status: DC
  Administered 2020-06-14 – 2020-06-21 (×11): 100 mg via ORAL
  Filled 2020-06-14 (×13): qty 1

## 2020-06-14 MED ORDER — SODIUM CHLORIDE 0.9% FLUSH: 3.0000 mL | INTRAVENOUS | Status: DC | PRN

## 2020-06-14 MED ORDER — ACETAMINOPHEN 325 MG PO TABS
650.0000 mg | ORAL_TABLET | ORAL | Status: DC | PRN
  Administered 2020-06-14 – 2020-06-21 (×12): 650 mg via ORAL
  Filled 2020-06-14 (×11): qty 2

## 2020-06-14 MED ORDER — ENOXAPARIN SODIUM 30 MG/0.3ML IJ SOSY
30.0000 mg | PREFILLED_SYRINGE | INTRAMUSCULAR | Status: DC
  Administered 2020-06-14 – 2020-06-15 (×2): 30 mg via SUBCUTANEOUS
  Filled 2020-06-14 (×2): qty 0.3

## 2020-06-14 NOTE — Progress Notes (Signed)
[facility]) Hospital Liaison note:  This patient is currently enrolled in [facility] outpatient-based Palliative Care. Will continue to follow for disposition.  Please call with any outpatient palliative questions or concerns.  Thank you, Lorelee Market, LPN [facility] Liaison [phone]

## 2020-06-14 NOTE — Telephone Encounter (Signed)
I called and spoke with the pts son.  He stated that the pt had an MI?   And he was discharged from the hospital to SNF.  He stated that once the pt got to the nursing home, they realized that he was on a trilogy machine and stated that they cannot accept him and sent him back to the ER.  He has been holding in the ER for th last 3 days and the son is worried about his care.  He wanted to see if the pt can be changed from the trilogy to a BIPAP so they can find placement for him.  No one will take him with him being on the trilogy---the social worker has been working on finding him a new facility and has not been able to find one that will accept him.  MW please advise. Thanks

## 2020-06-14 NOTE — ED Notes (Signed)
3E asked for second covid swab before he comes to the floor. Swab ordered and will be done.

## 2020-06-14 NOTE — Progress Notes (Signed)
CSW contacted [facility] and admissions stated they would call back in 15 minutes to see if they could accept patient.

## 2020-06-14 NOTE — Progress Notes (Signed)
[facility] denied patient due to his Trilogy needs.

## 2020-06-14 NOTE — ED Notes (Addendum)
Introduced self to pt and wife at bedside. Pt given graham crackers and milk. Wife informed that there is no update at this time on bed availability in hospital.

## 2020-06-14 NOTE — Progress Notes (Signed)
[facility]- They do not accept patients on the [place].   [facility]- They do not accept patients on the Trilogy Bipap.

## 2020-06-14 NOTE — Telephone Encounter (Signed)
I wish I could but I don't have control over what's going on in the ER so I can't comment other that to point out that we have an inpt service which could see him at the request of the ER/attending there  to help troubleshoot the problem (and reassess role of palliative care here as well as I really don't have anything I can offer in the office)

## 2020-06-14 NOTE — Progress Notes (Signed)
Message left with [name]

## 2020-06-14 NOTE — ED Notes (Signed)
Wound care contacted, pt and spouse updated.

## 2020-06-14 NOTE — ED Notes (Signed)
Sitting on BS, eating meal tray, wife at [facility]. Updated. Requested EDP to re-evaluate for inpt admission.

## 2020-06-14 NOTE — ED Notes (Signed)
EDP and ED DON at [facility] speaking with spouse and daughter.

## 2020-06-14 NOTE — Progress Notes (Signed)
[facility] denied patient. CSW left message with [facility]. [facility] is willing to review patient.

## 2020-06-14 NOTE — ED Notes (Signed)
Lunch tray delivered.

## 2020-06-14 NOTE — Progress Notes (Signed)
[facility] at both [facility] and [place] locations do not accept patients with Trilogy

## 2020-06-14 NOTE — Telephone Encounter (Signed)
Pt son Randall Hiss calling(ok per dpr) Pt is in ER currently [facility] Rm 37- holding for 3 days. Pt is trying to get into SNF, they're worried about trelegy machine. Pt son(physician?) thinks pt needs bipap. Please advise [phone]

## 2020-06-14 NOTE — Consult Note (Signed)
WOC Nurse Consult Note: Patient discharged to SNF and refused treatment once he arrived. WOC consulted for chronic, nonhealing wounds.  Seen at wound care center.  Missed a 06/08/20 appointment.  Reason for Consult:Chronic nonhealing wounds.  Wound type: pressure/trauma Pressure Injury POA: Yes Measurement:Sacrum:  6 cm circumferential erythema with nonintact wound near coccyx:  0.2 cm opening that probes 4 cm   Unable to visualize wound bed. Sacrum with breakdown to bilateral upper gluteal folds, consistent with moisture associated skin damage, partial thickness.  Left medial lower leg- nonhealing trauma wound:  4 cm x 2.2 xm x 0.8 cm with fibrin to wound bed. Has been getting sharps debridement at wound care center Right forearm:  Skin tear2.5 cm x 1 cm scabbed lesion that is oozing at perimeter Bridge of nose with device related pressure injury from trilogy Bipap.  Full thickness injury Wound bed: devitalized tissue to all wounds except coccyx Drainage (amount, consistency, odor) bleeds  No odor.  Periwound:Bruising to bilateral arms from thin, fragile skin/trauma Dressing procedure/placement/frequency: Cleanse sacral wound with NS and pat dry.  Pack open wound with Iodoform packing strip.  Gerhardts butt paste to sacral periwound skin. Cover sacrum with silicone border foam.  Left medial leg: cleanse wound with NS and pat dry.  Apply Aquacel Ag to open wound bed. COver with silicone foam.  Change Mon/Wed/Fri Right forearm:  2 cm x 1 cm scabbed lesion  Cover with silicone foam due to bleeding. Change every three days and PRN soilage.  Bridge of nose:   Cleanse with NS and pat dry. Apply mupirocin ointment daily and cover with silicone foam.  Will not follow at this time.  Please re-consult if needed.  Domenic Moras MSN, RN, FNP-BC CWON Wound, Ostomy, Continence Nurse Pager [phone]
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
## 2020-06-14 NOTE — Telephone Encounter (Signed)
Spoke with pt's son and notified of response per MW  He verbalized understanding  Nothing further needed

## 2020-06-14 NOTE — Progress Notes (Signed)
[facility] denied patient

## 2020-06-14 NOTE — Progress Notes (Signed)
CSW contacted [facility] with [facility]. [place] and [place] locations do not accept patients on the [employer].

## 2020-06-14 NOTE — ED Notes (Addendum)
Report received. Care assumed. Pt resting/sleeping on home CPAP. NAD, calm. Meal tray ordered/ pending.

## 2020-06-14 NOTE — Progress Notes (Signed)
[facility] is reviewing

## 2020-06-14 NOTE — H&P (Signed)
History and Physical    [name] EGB:[id number] DOB: 15-Jan-1929 DOA: 07/05/2020  PCP: [name], MD (Confirm with patient/family/NH records and if not entered, this has to be entered at [facility] point of entry) Patient coming from: Home  I have personally briefly reviewed patient's old medical records in [place]  Chief Complaint: SOB, feeling weak  HPI: [name] is a 87 y.o. male with medical history significant of CAD status post CABG, recent non-STEMI, chronic diastolic CHF, chronic hypoxic respiratory failure secondary to COPD and CHF on 2 L via nasal cannula plus trilogy at night, PVD status post right AKA, PAF, chronic iron deficiency anemia, chronic sacral and left shin ulcers, bladder cancer status post Foley catheter presented with increasing shortness of breath and generalized weakness.  Patient was just recently hospitalized for NSTEMI and CHF decompensation, discharged to SNF on 6/2.  Patient however sent home from SNF due to the reason that SNF unable to accommodate patient's Trelegy.  Increasingly for the last 3 days, patient developed more respiratory symptoms as well swelling of lower legs and generalized weakness.  No chest pains no cough no fever chills.  Family reported that the patient used to be able to transfer himself from bed to chair/wheelchair however he is not able to support him up to do either over that the last 2 days, because of shortness of breath and weakness. ED Course: Patient was found to be significantly deconditioned, chest x-ray showed worsening of pulmonary congestions.  BMP and CBC pending  Review of Systems: As per HPI otherwise 14 point review of systems negative.    Past Medical History:  Diagnosis Date  . Anxiety   . Arthritis    "generalized" (04/04/2017)  . Asthma   . Atrial fibrillation ([place])   . Basal cell carcinoma    "left ear"  . BPH (benign prostatic hyperplasia)    severe; s/p multiple biopsies  . CAD (coronary  artery disease)   . Carotid artery disease ([place])   . Chronic diastolic CHF (congestive heart failure) ([place]) 2019  . Chronic pain 04/08/2020  . Chronic respiratory failure ([place])   . Chronic rhinitis   . COPD (chronic obstructive pulmonary disease) (HCC)    2L [place] O2  . Depression   . Dilation of biliary tract   . Elevated troponin 03/09/2017  . Fall 04/08/2020  . Gallstones   . GERD (gastroesophageal reflux disease)   . History of blood transfusion    "w/his CABG" (04/04/2017)  . History of kidney stones   . Hyperlipidemia   . Hypertension   . On home oxygen therapy    "~ 24/7" (04/04/2017)  . Peripheral vascular disease ([place])   . Pneumonia 2019  . RBBB   . Squamous cell carcinoma of skin 04/23/2013   in situ-Right flank (txpbx)  . Squamous cell carcinoma of skin 01/23/2017   [place]/ in siut- Left ear rim (CX3FU)  . Syncope and collapse   . Ureteral tumor 08/2015   had endoscopic procedure for evaluation, unable to reach for biopsy  . Urinary catheter (Foley) change required     Past Surgical History:  Procedure Laterality Date  . ABDOMINAL AORTOGRAM W/LOWER EXTREMITY Bilateral 06/30/2019   Procedure: ABDOMINAL AORTOGRAM W/LOWER EXTREMITY;  Surgeon: Waynetta Sandy, MD;  Location: [facility];  Service: Cardiovascular;  Laterality: Bilateral;  . AMPUTATION Right 08/11/2019   Procedure: RIGHT ABOVE KNEE AMPUTATION;  Surgeon: Angelia Mould, MD;  Location: [facility];  Service: Vascular;  Laterality: Right;  . AMPUTATION Right 01/27/2020   Procedure: RIGHT AMPUTATION ABOVE KNEE REVISION;  Surgeon: Waynetta Sandy, MD;  Location: [place] [facility];  Service: Vascular;  Laterality: Right;  . BASAL CELL CARCINOMA EXCISION Left    ear  . CARDIAC CATHETERIZATION     "prior to bypass"  . CATARACT EXTRACTION W/ INTRAOCULAR LENS  IMPLANT, BILATERAL Bilateral   . CORONARY ARTERY BYPASS GRAFT  10/2001   LIMA to LAD, SVG to OM1-2, SVG to RCA and PDA  . LITHOTRIPSY    .  PROSTATE BIOPSY  10/2012   had bleeding after surgery.  . TONSILLECTOMY    . TRANSURETHRAL RESECTION OF BLADDER TUMOR N/A 04/06/2020   Procedure: TRANSURETHRAL RESECTION OF BLADDER TUMOR (TURBT)/ POST OPERATIVE INSTILLATION OF GEMCITABINE;  Surgeon: Festus Aloe, MD;  Location: [facility];  Service: Urology;  Laterality: N/A;     reports that he quit smoking about 38 years ago. His smoking use included cigarettes. He has a 66.00 pack-year smoking history. He has never used smokeless tobacco. He reports current alcohol use. He reports that he does not use drugs.  Allergies  Allergen Reactions  . Cefdinir Diarrhea and Other (See Comments)    Severe Diarrhea  . Nitrofurantoin Swelling and Other (See Comments)    Hand Swelling  . Sulfa Antibiotics Anaphylaxis and Swelling  . Sulfonamide Derivatives Swelling and Other (See Comments)    Facial/tongue swelling  . Tape Other (See Comments)    SKIN IS VERY THIN AND TEARS EASILY!!!!! Please do NOT use "plastic" tape- USE PAPER!!  . Ciprofloxacin Itching, Rash and Other (See Comments)    Red itchy hands  . Nitrofurantoin Swelling and Other (See Comments)    Swollen hands  . Amoxicillin Er Other (See Comments)    Frequest urination. Pt reports "not really" allergic 01/24/20  . Amoxicillin-Pot Clavulanate     Other reaction(s): groggy  . Doxycycline Other (See Comments)    "Felt terrible" Pt wife reports "not really" allergic 01/24/20  . Keflex [Cephalexin] Other (See Comments)    Pt does not recall reaction   . Mirtazapine Other (See Comments)    Pt does not recall reaction  . Ranolazine Other (See Comments)    Inability to ambulate   . Chlorhexidine Gluconate Rash  . Levaquin [Levofloxacin] Itching and Rash  . Sertraline Anxiety and Other (See Comments)    Makes the patient jittery    Family History  Problem Relation Age of Onset  . Heart disease Mother   . Heart disease Father        Before age 2  . Breast cancer Mother   .  Cancer Mother        Breast cancer  . Hypertension Mother   . Other Mother        AAA  and   Amputation  . Heart attack Father   . Stroke Brother        x3, still living   . Peripheral vascular disease Brother   . Heart disease Brother   . Hyperlipidemia Brother   . Hypertension Brother   . Heart disease Brother   . Allergies Brother      Prior to Admission medications   Medication Sig Start Date End Date Taking? Authorizing Provider  acetaminophen (TYLENOL) 500 MG tablet Take 500 mg by mouth 3 (three) times daily.   Yes [provider]  ALPRAZolam (XANAX) 0.5 MG tablet Take 1 tablet (0.5 mg total) by mouth 3 (three)  times daily as needed for anxiety. Patient taking differently: Take 0.5 mg by mouth 3 (three) times daily. 06/10/20  Yes Hosie Poisson, MD  aspirin 81 MG tablet Take 81 mg by mouth daily.   Yes [provider]  atorvastatin (LIPITOR) 40 MG tablet Take 1 tablet (40 mg total) by mouth at bedtime. 06/10/20  Yes Hosie Poisson, MD  calcium carbonate (TUMS - DOSED IN MG ELEMENTAL CALCIUM) 500 MG chewable tablet Chew 1 tablet (200 mg of elemental calcium total) by mouth 2 (two) times daily as needed for indigestion or heartburn. 09/01/19  Yes Love, Ivan Anchors, PA-C  Cholecalciferol (VITAMIN D3) 50 MCG (2000 UT) TABS Take 2,000 Units by mouth daily with lunch. Patient taking differently: Take 2,000 Units by mouth daily. 02/16/20  Yes Angiulli, Lavon Paganini, PA-C  docusate sodium (COLACE) 100 MG capsule Take 100 mg by mouth at bedtime.   Yes [provider]  ferrous sulfate 325 (65 FE) MG tablet Take 325 mg by mouth daily with breakfast.   Yes [provider]  finasteride (PROSCAR) 5 MG tablet Take 1 tablet (5 mg total) by mouth daily with lunch. 02/16/20  Yes Angiulli, Lavon Paganini, PA-C  FLUoxetine (PROZAC) 10 MG capsule Take 10 mg by mouth at bedtime. 05/26/20  Yes [provider]  fluticasone (FLONASE) 50 MCG/ACT nasal spray Place 2 sprays into both  nostrils daily.   Yes [provider]  furosemide (LASIX) 20 MG tablet Take 40 mg by mouth 2 (two) times daily.   Yes [provider]  guaiFENesin (MUCINEX) 600 MG 12 hr tablet Take 1 tablet (600 mg total) by mouth 2 (two) times daily. 02/16/20  Yes Angiulli, Lavon Paganini, PA-C  levalbuterol [facility] HFA) 45 MCG/ACT inhaler Inhale 2 puffs into the lungs every 4 (four) hours as needed for wheezing or shortness of breath. 02/16/20  Yes Angiulli, Lavon Paganini, PA-C  metoprolol tartrate (LOPRESSOR) 25 MG tablet Take 0.5 tablets (12.5 mg total) by mouth 3 (three) times daily. 06/10/20  Yes Hosie Poisson, MD  Multiple Vitamins-Minerals (PRESERVISION AREDS 2) CAPS Take 1 capsule by mouth daily.   Yes [provider]  NITROSTAT 0.4 MG SL tablet Place 0.4 mg under the tongue every 5 (five) minutes as needed for chest pain.   Yes [provider]  oxyCODONE (OXY IR/ROXICODONE) 5 MG immediate release tablet Take 0.5 tablets (2.5 mg total) by mouth every 6 (six) hours as needed for breakthrough pain. 06/10/20  Yes Hosie Poisson, MD  OXYGEN Inhale 2 L/min into the lungs continuous.    Yes [provider]  pantoprazole (PROTONIX) 40 MG tablet TAKE 1 TABLET(40 MG) BY MOUTH DAILY 30 TO 60 MINUTES BEFORE FIRST MEAL OF THE DAY Patient taking differently: Take 40 mg by mouth daily. 02/16/20  Yes Angiulli, Lavon Paganini, PA-C  Polyethyl Glycol-Propyl Glycol (SYSTANE) 0.4-0.3 % SOLN Apply 1 drop to eye 2 (two) times daily as needed (eye irritation). Patient taking differently: Apply 1 drop to eye in the morning, at noon, and at bedtime. 09/01/19  Yes Love, Ivan Anchors, PA-C  polyethylene glycol (MIRALAX / GLYCOLAX) 17 g packet Take 17 g by mouth daily. 09/01/19  Yes Love, Ivan Anchors, PA-C  predniSONE (DELTASONE) 10 MG tablet Take 1 tablet (10 mg total) by mouth daily. 05/06/20  Yes Tanda Rockers, MD  predniSONE (DELTASONE) 10 MG tablet Take 10 mg by mouth daily as needed (copd flare up).   Yes [provider]  sodium chloride (MURO 128) 5 %  ophthalmic solution Place 1 drop into both eyes 4 (four) times daily. 08/22/19  Yes Love, Ivan Anchors, PA-C  SPIRIVA RESPIMAT 2.5 MCG/ACT AERS INHALE 2 PUFFS BY MOUTH EVERY DAY Patient taking differently: Take 2 puffs by mouth daily. 05/17/20  Yes Tanda Rockers, MD  SYMBICORT 160-4.5 MCG/ACT inhaler Inhale 2 puffs into the lungs 2 (two) times daily. 02/16/20  Yes Angiulli, Lavon Paganini, PA-C  vitamin B-12 (CYANOCOBALAMIN) 1000 MCG tablet Take 1 tablet (1,000 mcg total) by mouth daily. 02/16/20  Yes Angiulli, Lavon Paganini, PA-C  COLLAGEN MATRIX-SILVER EX Apply 1 tablet topically every other day. Patient not taking: No sig reported    [provider]  famotidine (PEPCID) 20 MG tablet Take 20 mg by mouth at bedtime as needed for heartburn or indigestion. Patient not taking: No sig reported    [provider]  levalbuterol (XOPENEX) 0.63 MG/3ML nebulizer solution Take 3 mLs (0.63 mg total) by nebulization every 4 (four) hours as needed for wheezing or shortness of breath. Patient not taking: No sig reported 10/13/19   Tanda Rockers, MD    Physical Exam: Vitals:   06/14/20 0835 06/14/20 1115 06/14/20 1200 06/14/20 1612  BP:  122/65 118/62 117/83  Pulse:  (!) 58 60 60  Resp:  18  15  Temp: 98.1 F (36.7 C) 97.7 F (36.5 C)  (!) 97.4 F (36.3 C)  TempSrc: Axillary Oral  Oral  SpO2:  99% 99% 99%  Weight:      Height:        Constitutional: NAD, calm, comfortable Vitals:   06/14/20 0835 06/14/20 1115 06/14/20 1200 06/14/20 1612  BP:  122/65 118/62 117/83  Pulse:  (!) 58 60 60  Resp:  18  15  Temp: 98.1 F (36.7 C) 97.7 F (36.5 C)  (!) 97.4 F (36.3 C)  TempSrc: Axillary Oral  Oral  SpO2:  99% 99% 99%  Weight:      Height:       Eyes: PERRL, lids and conjunctivae normal ENMT: Mucous membranes are moist. Posterior pharynx clear of any exudate or lesions.Normal dentition.  Neck: normal, supple, no masses, no  thyromegaly Respiratory: clear to auscultation bilaterally, no wheezing, fine crackles on bilateral bases.  Increasing respiratory effort, talking in broken sentences. No accessory muscle use.  Cardiovascular: Regular rate and rhythm, no murmurs / rubs / gallops.  2+ extremity edema. 2+ pedal pulses. No carotid bruits.  Abdomen: no tenderness, no masses palpated. No hepatosplenomegaly. Bowel sounds positive.  Musculoskeletal: no clubbing / cyanosis. No joint deformity upper and lower extremities. Good ROM, no contractures. Normal muscle tone.  Skin: Large sacral wound and left shin ulcer, bottoms are clean Neurologic: CN 2-12 grossly intact. Sensation intact, DTR normal. Strength 5/5 in all 4.  Psychiatric: Normal judgment and insight. Alert and oriented x 3. Normal mood.     Labs on Admission: I have personally reviewed following labs and imaging studies  CBC: Recent Labs  Lab 06/08/20 0257  WBC 5.9  HGB 7.3*  HCT 22.9*  MCV 95.4  PLT 454   Basic Metabolic Panel: Recent Labs  Lab 06/08/20 0257 06/09/20 0325  NA 127* 128*  K 4.2 4.1  CL 89* 88*  CO2 31 32  GLUCOSE 92 87  BUN 21 17  CREATININE 1.01 0.95  CALCIUM 8.7* 8.9   GFR: Estimated Creatinine Clearance: 43.7 mL/min (by C-G formula based on SCr of 0.95 mg/dL). Liver Function Tests: No results for input(s): AST, ALT, ALKPHOS, BILITOT, PROT,  ALBUMIN in the last 168 hours. No results for input(s): LIPASE, AMYLASE in the last 168 hours. No results for input(s): AMMONIA in the last 168 hours. Coagulation Profile: No results for input(s): INR, PROTIME in the last 168 hours. Cardiac Enzymes: No results for input(s): CKTOTAL, CKMB, CKMBINDEX, TROPONINI in the last 168 hours. BNP (last 3 results) No results for input(s): PROBNP in the last 8760 hours. HbA1C: No results for input(s): HGBA1C in the last 72 hours. CBG: No results for input(s): GLUCAP in the last 168 hours. Lipid Profile: No results for input(s): CHOL,  HDL, LDLCALC, TRIG, CHOLHDL, LDLDIRECT in the last 72 hours. Thyroid Function Tests: No results for input(s): TSH, T4TOTAL, FREET4, T3FREE, THYROIDAB in the last 72 hours. Anemia Panel: No results for input(s): VITAMINB12, FOLATE, FERRITIN, TIBC, IRON, RETICCTPCT in the last 72 hours. Urine analysis:    Component Value Date/Time   COLORURINE YELLOW 06/05/2020 1925   APPEARANCEUR HAZY (A) 06/05/2020 1925   LABSPEC 1.014 06/05/2020 1925   PHURINE 5.0 06/05/2020 1925   GLUCOSEU NEGATIVE 06/05/2020 1925   HGBUR NEGATIVE 06/05/2020 1925   BILIRUBINUR NEGATIVE 06/05/2020 1925   KETONESUR NEGATIVE 06/05/2020 1925   PROTEINUR NEGATIVE 06/05/2020 1925   NITRITE NEGATIVE 06/05/2020 1925   LEUKOCYTESUR LARGE (A) 06/05/2020 1925    Radiological Exams on Admission: No results found.  EKG: Ordered  Assessment/Plan Active Problems:   Acute heart failure (HCC)   CHF (congestive heart failure) ([place])  (please populate well all problems here in Problem List. (For example, if patient is on BP meds at home and you resume or decide to hold them, it is a problem that needs to be her. Same for CAD, COPD, HLD and so on)  Acute on chronic diastolic CHF decompensation -Signs of fluid overload -Change p.o. Lasix to IV 40 mg twice daily, monitor kidney function. -CBC pending, if hemoglobin level remains low around 7 as compared to last admission, will consider transfusion.  Generalized weakness/ambulation dysfunction -Likely secondary to CHF decompensation, patient has not been able to perform transferring himself out of bed as he demonstrated before.  Ordered PT evaluation.  Expect SNF discharge with trilogy capability.  Chronic iron deficiency anemia -Iron saturation 6% 1 week ago, despite on iron p.o., will give 1 dose of IV iron.  Follow-up with PCP for following IV doses  Acute on chronic hypoxic respite failure -Secondary to CHF decompensation, IV diuresis.  Continue trilogy  support.  Hyponatremia, chronic -Secondary to CHF, BMP pending.  Pressure ulcers on left shin and sacral area -No signs of active infection, consult wound care.  Chronic indwelling Foley -Check UA -Next scheduled for exchange on 6/13  DVT prophylaxis: Lovenox Code Status: Partial code No CPR or Intubation, but agreeable with medications and other support Family Communication: Wife at bedside Disposition Plan: Expect more than 2 midnight hospital stay for IV diuresis, may need transfusion Consults called: None Admission status: Tele admit   Lequita Halt MD [facility] Pager [phone]  06/14/2020, 4:26 PM

## 2020-06-14 NOTE — Progress Notes (Signed)
[facility] does not accept patients with Medicare part A and B if they are not admitted into the hospital for 3 days. They are not accepting the wavier

## 2020-06-14 NOTE — Progress Notes (Signed)
[facility] is willing to review patient

## 2020-06-14 NOTE — ED Notes (Signed)
EDP into room to speak with pt/spouse.

## 2020-06-14 NOTE — Telephone Encounter (Signed)
Pt's son returning a phone call. Pt's son can be reached at [phone].

## 2020-06-14 NOTE — Progress Notes (Signed)
[facility] denied patient. [facility] cannot meet patient needs due to [employer].

## 2020-06-14 NOTE — ED Notes (Signed)
Wife given an update.

## 2020-06-14 NOTE — ED Notes (Signed)
meds given. hospitalist in to see/ reassess. Wife at [facility].

## 2020-06-14 NOTE — ED Notes (Signed)
Emptied pts foley, repositioned pt and provided new warm blankets, and wrapped pts right elbow wound

## 2020-06-14 NOTE — ED Notes (Signed)
Changed dressing on nose. Small sore on bridge of nose. Cleaned and placed a new bandaid. Bloody area on right elbow area. Cleaned and dressed with tegaderm. left leg wound cleaned and dressed with Aquacel and covered with tegaderm. Wound deep and about 1inch by 1 1/2 inch. Purulent drainage. Sacral area red and excoriated. Small wound above the anus. Packed with iodoform strip. Gerhardt butt paste applied around area and tegaderm applied along with border flex dressing. Peri care done, sheets changed and warm blankets applied.

## 2020-06-14 NOTE — Progress Notes (Signed)
Message left with [facility] admissions.

## 2020-06-14 NOTE — Telephone Encounter (Signed)
I have called and LM on VM for Jessie Foot, pts son to call back.

## 2020-06-14 NOTE — ED Notes (Addendum)
Pt alert, NAD, calm, interactive, HOH, with wife at [facility]. Pt eating breakfast. Foley emptied. Meds given. Wants something for pain and anxiety. Refuses dulera and ellipta. Taking his own spiriva and symbicort

## 2020-06-15 DIAGNOSIS — Z978 Presence of other specified devices: Secondary | ICD-10-CM

## 2020-06-15 LAB — BASIC METABOLIC PANEL
Anion gap: 5 (ref 5–15)
BUN: 16 mg/dL (ref 8–23)
CO2: 35 mmol/L — ABNORMAL HIGH (ref 22–32)
Calcium: 8.4 mg/dL — ABNORMAL LOW (ref 8.9–10.3)
Chloride: 83 mmol/L — ABNORMAL LOW (ref 98–111)
Creatinine, Ser: 1.2 mg/dL (ref 0.61–1.24)
GFR, Estimated: 58 mL/min — ABNORMAL LOW (ref >60)
Glucose, Bld: 138 mg/dL — ABNORMAL HIGH (ref 70–99)
Potassium: 3.7 mmol/L (ref 3.5–5.1)
Sodium: 123 mmol/L — ABNORMAL LOW (ref 135–145)

## 2020-06-15 LAB — HEPATIC FUNCTION PANEL
ALT: 16 U/L (ref 0–44)
AST: 22 U/L (ref 15–41)
Albumin: 2.8 g/dL — ABNORMAL LOW (ref 3.5–5.0)
Alkaline Phosphatase: 51 U/L (ref 38–126)
Bilirubin, Direct: <0.1 mg/dL (ref 0.0–0.2)
Total Bilirubin: 0.7 mg/dL (ref 0.3–1.2)
Total Protein: 4.8 g/dL — ABNORMAL LOW (ref 6.5–8.1)

## 2020-06-15 LAB — IRON AND TIBC
Iron: 292 ug/dL — ABNORMAL HIGH (ref 45–182)
Saturation Ratios: 87 % — ABNORMAL HIGH (ref 17.9–39.5)
TIBC: 336 ug/dL (ref 250–450)
UIBC: 44 ug/dL

## 2020-06-15 LAB — FOLATE: Folate: 9.7 ng/mL (ref >5.9)

## 2020-06-15 LAB — RETICULOCYTES
Immature Retic Fract: 10.5 % (ref 2.3–15.9)
RBC.: 2.68 MIL/uL — ABNORMAL LOW (ref 4.22–5.81)
Retic Count, Absolute: 50.4 10*3/uL (ref 19.0–186.0)
Retic Ct Pct: 1.9 % (ref 0.4–3.1)

## 2020-06-15 LAB — VITAMIN B12: Vitamin B-12: 2655 pg/mL — ABNORMAL HIGH (ref 180–914)

## 2020-06-15 LAB — BRAIN NATRIURETIC PEPTIDE: B Natriuretic Peptide: 361 pg/mL — ABNORMAL HIGH (ref 0.0–100.0)

## 2020-06-15 LAB — FERRITIN: Ferritin: 132 ng/mL (ref 24–336)

## 2020-06-15 LAB — SARS CORONAVIRUS 2 (TAT 6-24 HRS): SARS Coronavirus 2: NEGATIVE

## 2020-06-15 MED ORDER — FUROSEMIDE 40 MG PO TABS: 40.0000 mg | ORAL_TABLET | Freq: Two times a day (BID) | ORAL | Status: DC

## 2020-06-15 MED ORDER — POLYETHYL GLYCOL-PROPYL GLYCOL 0.4-0.3 % OP SOLN
1.0000 [drp] | Freq: Three times a day (TID) | OPHTHALMIC | Status: DC
  Administered 2020-06-15 – 2020-06-21 (×18): 1 [drp] via OPHTHALMIC

## 2020-06-15 MED ORDER — TIOTROPIUM BROMIDE MONOHYDRATE 2.5 MCG/ACT IN AERS
2.0000 | INHALATION_SPRAY | Freq: Every day | RESPIRATORY_TRACT | Status: DC
  Administered 2020-06-19 – 2020-06-21 (×3): 2 via RESPIRATORY_TRACT

## 2020-06-15 MED ORDER — BUDESONIDE-FORMOTEROL FUMARATE 160-4.5 MCG/ACT IN AERO
2.0000 | INHALATION_SPRAY | Freq: Two times a day (BID) | RESPIRATORY_TRACT | Status: DC
  Administered 2020-06-15 – 2020-06-21 (×11): 2 via RESPIRATORY_TRACT

## 2020-06-15 NOTE — Progress Notes (Signed)
[facility] PROGRESS NOTE    Progress Note  [name]  YQI:[id number] DOB: [birth year] DOA: 06/20/2020 PCP: [name], MD     Brief Narrative:   [name] is an 87 y.o. male has medical history of CAD status post CABG, with a recent non-STEMI, chronic diastolic heart failure with chronic respiratory failure on 2 L of oxygen due to COPD wears a trilogy at night, PVD status post AKA, with paroxysmal atrial fibrillation sprain, chronic iron deficiency anemia and multiple sacral decubitus ulcer with a left shin ulcer history of bladder cancer status post Foley catheter, recently discharged on 06/10/2020 for an NSTEMI and acute decompensated heart failure comes in with shortness of breath and generalized weakness which get getting progressively worse.   Assessment/Plan:   Acute on chronic diastolic heart failure He is currently on 2 2 L of oxygen  Discontinue IV Lasix his swelling is likely due to his low albumin. Check hepatic function panel. He is negative about 4 L.  Blood pressure is holding steady. I's and O's and daily weights. There has been a mild increase in his creatinine we will go ahead and hold his Lasix. He is becoming hyponatremic likely due to overdiuresis. Wife does not want to transition to hospice. Palliative Care has been followed him in the past she has unrealistic expectation about his outcomes. Consult TOC and physical therapy to try to get him to a skilled nursing facility.  Generalized weakness/ambulatory dysfunction: Likely secondary to age and multiple comorbidities not to mention AKA. He has not been able to transfer himself out of bed as demonstrated by his multiple ulcers. Physical therapy has been consulted and awaiting evaluation. Patient is expected to discharge back to skilled nursing facility who has trilogy capability.  Chronic iron deficiency anemia: Follow-up with PCP as an outpatient. Check an anemia panel and FOBT his hemoglobin  is trending up.  Acute on chronic respiratory failure with hypoxia Now with his baseline of 2 L of oxygen in no acute distress.  Chronic hyponatremia: Now worse likely due to overdiuresis we will hold Lasix.  Chronic indwelling Foley catheter: Noted next exchange on 06/21/2020  Multiple pressure ulcers of the sacrum and left shin present on admission no signs of infection: Wound care has been consulted RN Pressure Injury Documentation: Pressure Injury 01/24/20 Coccyx Mid;Lower Stage 2 -  Partial thickness loss of dermis presenting as a shallow open injury with a red, pink wound bed without slough. (Active)  01/24/20 1445  Location: Coccyx  Location Orientation: Mid;Lower  Staging: Stage 2 -  Partial thickness loss of dermis presenting as a shallow open injury with a red, pink wound bed without slough.  Wound Description (Comments):   Present on Admission: Yes     Pressure Injury 02/06/20 Buttocks Left Stage 2 -  Partial thickness loss of dermis presenting as a shallow open injury with a red, pink wound bed without slough. Above previous wound (Active)  02/06/20 1459  Location: Buttocks  Location Orientation: Left  Staging: Stage 2 -  Partial thickness loss of dermis presenting as a shallow open injury with a red, pink wound bed without slough.  Wound Description (Comments): Above previous wound  Present on Admission: Yes     Pressure Injury 04/08/20 Buttocks Medial Stage 3 -  Full thickness tissue loss. Subcutaneous fat may be visible but bone, tendon or muscle are NOT exposed. (Active)  04/08/20 1500  Location: Buttocks  Location Orientation: Medial  Staging: Stage 3 -  Full thickness tissue loss. Subcutaneous fat may be visible but bone, tendon or muscle are NOT exposed.  Wound Description (Comments):   Present on Admission: Yes    Estimated body mass index is 20.68 kg/m as calculated from the following:   Height as of this encounter: 5\' 8"  (1.727 m).   Weight as of this  encounter: 61.7 kg.   DVT prophylaxis: SBD Family Communication:wife Status is: Inpatient  Remains inpatient appropriate because:Hemodynamically unstable   Dispo: The patient is from: SNF              Anticipated d/c is to: SNF              Patient currently is not medically stable to d/c.   Difficult to place patient No        Code Status:     Code Status Orders  (From admission, onward)         Start     Ordered   06/14/20 1624  Limited resuscitation (code)  Continuous       Question Answer Comment  In the event of cardiac or respiratory ARREST: Initiate Code Blue, Call Rapid Response Yes   In the event of cardiac or respiratory ARREST: Perform CPR No   In the event of cardiac or respiratory ARREST: Perform Intubation/Mechanical Ventilation No   In the event of cardiac or respiratory ARREST: Use NIPPV/BiPAp only if indicated Yes   In the event of cardiac or respiratory ARREST: Administer ACLS medications if indicated Yes   In the event of cardiac or respiratory ARREST: Perform Defibrillation or Cardioversion if indicated Yes      06/14/20 1625        Code Status History    Date Active Date Inactive Code Status Order ID Comments User Context   06/06/2020 1619 06/10/2020 1820 Partial Code [id number]  Rosezella Rumpf, NP Inpatient   06/05/2020 1918 06/06/2020 1619 Partial Code [id number]  Rhetta Mura, DO ED   04/08/2020 1618 04/10/2020 2039 Partial Code [id number]  Karmen Bongo, MD Inpatient   04/08/2020 1207 04/08/2020 1618 DNR [id number]  Karmen Bongo, MD ED   02/05/2020 1529 02/16/2020 1753 Full Code [id number]  Cathlyn Parsons, PA-C Inpatient   02/05/2020 1529 02/05/2020 1529 Full Code [id number]  Cathlyn Parsons, PA-C Inpatient   01/26/2020 1729 02/05/2020 1520 Full Code [id number]  Aline August, MD Inpatient   01/26/2020 1001 01/26/2020 1729 DNR [id number]  Aline August, MD Inpatient   01/24/2020 1313 01/26/2020 1001 Partial Code [id number] CODE STATUS was  verified with patient's wife and patient at bedside Travis Deer, MD Inpatient   08/21/2019 1601 09/01/2019 1821 Partial Code [id number]  Bary Leriche, PA-C Inpatient   08/21/2019 1601 08/21/2019 1601 Partial Code [id number]  Bary Leriche, PA-C Inpatient   08/21/2019 0037 08/21/2019 1514 Partial Code [id number]  Vianne Bulls, MD Inpatient   08/17/2019 2148 08/20/2019 2124 Partial Code [id number]  Bartholomew Boards, RN Inpatient   08/14/2019 1541 08/17/2019 2148 DNR [id number]  Bary Leriche, PA-C Inpatient   08/11/2019 1741 08/14/2019 1523 DNR [id number]  Norval Morton, MD Inpatient   08/11/2019 1549 08/11/2019 1741 Full Code [id number]  Karoline Caldwell, PA-C Inpatient   07/01/2019 0937 07/03/2019 0044 DNR [id number]  Knox Royalty, NP Inpatient   06/27/2019 2039 07/01/2019 0937 Partial Code [id number]  Lenore Cordia, MD ED   12/03/2017 1959 12/04/2017 2041 Full Code [id number]  Elgergawy, Silver Huguenin, MD Inpatient   04/04/2017 1038 04/05/2017 1915  Full Code [id number]  Dionne Milo, NP ED   03/24/2017 2258 03/26/2017 1930 Full Code [id number]  Vianne Bulls, MD ED   03/08/2017 1808 03/12/2017 2230 Full Code [id number]  Isaiah Serge, NP ED   02/04/2017 1814 02/09/2017 1929 Full Code [id number]  Doreatha Lew, MD ED   09/24/2015 2245 09/26/2015 1738 Full Code [id number]  Isaiah Serge, NP Inpatient   09/13/2015 0240 09/16/2015 1551 Full Code [id number]  Karmen Bongo, MD Inpatient   Advance Care Planning Activity    Advance Directive Documentation   Flowsheet Row Most Recent Value  Type of Advance Directive Out of facility DNR (pink MOST or yellow form)  Pre-existing out of facility DNR order (yellow form or pink MOST form) --  "MOST" Form in Place? --        IV Access:    Peripheral IV   Procedures and diagnostic studies:   DG Chest Port 1 View  Result Date: 06/14/2020 CLINICAL DATA:  Shortness of breath. EXAM: PORTABLE CHEST 1 VIEW COMPARISON:  Jun 05, 2020. FINDINGS: The heart size and mediastinal  contours are within normal limits. Status post coronary bypass graft. No pneumothorax is noted. Minimal bibasilar subsegmental atelectasis is noted with small left pleural effusion. The visualized skeletal structures are unremarkable. IMPRESSION: Minimal bibasilar subsegmental atelectasis is noted with small left pleural effusion. Aortic Atherosclerosis (ICD10-I70.0). Electronically Signed   By: Marijo Conception M.D.   On: 06/14/2020 16:26     Medical Consultants:    None.   Subjective:    [name] he relates he is close to baseline no new complaints.  Objective:    Vitals:   06/15/20 0031 06/15/20 0407 06/15/20 0500 06/15/20 0737  BP:  (!) 83/66  (!) 107/59  Pulse:  (!) 53  (!) 58  Resp:  20  19  Temp:  (!) 97.5 F (36.4 C)  (!) 97.5 F (36.4 C)  TempSrc:  Oral  Oral  SpO2:  97%  99%  Weight: 61.7 kg  61.7 kg   Height:       SpO2: 99 % O2 Flow Rate (L/min): 2 L/min   Intake/Output Summary (Last 24 hours) at 06/15/2020 0908 Last data filed at 06/15/2020 0408 Gross per 24 hour  Intake 243 ml  Output 2825 ml  Net -2582 ml   Filed Weights   06/14/20 2250 06/15/20 0031 06/15/20 0500  Weight: 61.9 kg 61.7 kg 61.7 kg    Exam: General exam: In no acute distress. Respiratory system: Good air movement and clear to auscultation. Cardiovascular system: S1 & S2 heard, RRR. No JVD. Gastrointestinal system: Abdomen is nondistended, soft and nontender.  Central nervous system: Alert and oriented. No focal neurological deficits. Extremities: 3+ edema bilaterally he is anasarqic Skin: No rashes, lesions or ulcers Psychiatry: Judgement and insight appear normal. Mood & affect appropriate.    Data Reviewed:    Labs: Basic Metabolic Panel: Recent Labs  Lab 06/09/20 0325 06/14/20 2332  NA 128* 123*  K 4.1 3.7  CL 88* 83*  CO2 32 35*  GLUCOSE 87 138*  BUN 17 16  CREATININE 0.95 1.20  CALCIUM 8.9 8.4*   GFR Estimated Creatinine Clearance: 37.1 mL/min (by C-G  formula based on SCr of 1.2 mg/dL). Liver Function Tests: No results for input(s): AST, ALT, ALKPHOS, BILITOT, PROT, ALBUMIN in the last 168 hours. No results for input(s): LIPASE, AMYLASE in the last 168 hours. No results for input(s): AMMONIA in the last 168 hours.  Coagulation profile No results for input(s): INR, PROTIME in the last 168 hours. COVID-19 Labs  No results for input(s): DDIMER, FERRITIN, LDH, CRP in the last 72 hours.  Lab Results  Component Value Date   SARSCOV2NAA NEGATIVE 06/14/2020   SARSCOV2NAA NEGATIVE 06/09/2020   SARSCOV2NAA NEGATIVE 06/05/2020   [place] NEGATIVE 04/08/2020    CBC: Recent Labs  Lab 06/14/20 2332  WBC 9.5  HGB 8.6*  HCT 26.5*  MCV 93.6  PLT 298   Cardiac Enzymes: No results for input(s): CKTOTAL, CKMB, CKMBINDEX, TROPONINI in the last 168 hours. BNP (last 3 results) No results for input(s): PROBNP in the last 8760 hours. CBG: No results for input(s): GLUCAP in the last 168 hours. D-Dimer: No results for input(s): DDIMER in the last 72 hours. Hgb A1c: No results for input(s): HGBA1C in the last 72 hours. Lipid Profile: No results for input(s): CHOL, HDL, LDLCALC, TRIG, CHOLHDL, LDLDIRECT in the last 72 hours. Thyroid function studies: No results for input(s): TSH, T4TOTAL, T3FREE, THYROIDAB in the last 72 hours.  Invalid input(s): FREET3 Anemia work up: No results for input(s): VITAMINB12, FOLATE, FERRITIN, TIBC, IRON, RETICCTPCT in the last 72 hours. Sepsis Labs: Recent Labs  Lab 06/14/20 2332  WBC 9.5   Microbiology Recent Results (from the past 240 hour(s))  SARS CORONAVIRUS 2 (TAT 6-24 HRS) Nasopharyngeal Nasopharyngeal Swab     Status: None   Collection Time: 06/05/20  4:36 PM   Specimen: Nasopharyngeal Swab  Result Value Ref Range Status   SARS Coronavirus 2 NEGATIVE NEGATIVE Final    Comment: (NOTE) SARS-CoV-2 target nucleic acids are NOT DETECTED.  The SARS-CoV-2 RNA is generally detectable in upper and  lower respiratory specimens during the acute phase of infection. Negative results do not preclude SARS-CoV-2 infection, do not rule out co-infections with other pathogens, and should not be used as the sole basis for treatment or other patient management decisions. Negative results must be combined with clinical observations, patient history, and epidemiological information. The expected result is Negative.  Fact Sheet for Patients: SugarRoll.be  Fact Sheet for Healthcare Providers: https://www.woods-mathews.com/  This test is not yet approved or cleared by the [place] FDA and  has been authorized for detection and/or diagnosis of SARS-CoV-2 by FDA under an Emergency Use Authorization (EUA). This EUA will remain  in effect (meaning this test can be used) for the duration of the COVID-19 declaration under Se ction 564(b)(1) of the Act, 21 U.S.C. section 360bbb-3(b)(1), unless the authorization is terminated or revoked sooner.  Performed at [facility], [place] [address], [place], [place] [address]   Culture, blood (Routine X 2) w Reflex to ID Panel     Status: None   Collection Time: 06/06/20 12:38 AM   Specimen: BLOOD RIGHT ARM  Result Value Ref Range Status   Specimen Description BLOOD RIGHT ARM  Final   Special Requests   Final    BOTTLES DRAWN AEROBIC ONLY Blood Culture adequate volume   Culture   Final    NO GROWTH 5 DAYS Performed at [facility], [address], [place], [place] [address]    Report Status 06/17/2020 FINAL  Final  Culture, blood (Routine X 2) w Reflex to ID Panel     Status: None   Collection Time: 06/06/20 12:53 AM   Specimen: BLOOD LEFT HAND  Result Value Ref Range Status   Specimen Description BLOOD LEFT HAND  Final   Special Requests   Final    BOTTLES  DRAWN AEROBIC AND ANAEROBIC Blood Culture adequate volume   Culture   Final    NO GROWTH 5 DAYS Performed at [facility], [place] [address], [place], [place] [address]    Report Status 06/10/2020 FINAL  Final  SARS CORONAVIRUS 2 (TAT 6-24 HRS) Nasopharyngeal Nasopharyngeal Swab     Status: None   Collection Time: 06/09/20  4:48 PM   Specimen: Nasopharyngeal Swab  Result Value Ref Range Status   SARS Coronavirus 2 NEGATIVE NEGATIVE Final    Comment: (NOTE) SARS-CoV-2 target nucleic acids are NOT DETECTED.  The SARS-CoV-2 RNA is generally detectable in upper and lower respiratory specimens during the acute phase of infection. Negative results do not preclude SARS-CoV-2 infection, do not rule out co-infections with other pathogens, and should not be used as the sole basis for treatment or other patient management decisions. Negative results must be combined with clinical observations, patient history, and epidemiological information. The expected result is Negative.  Fact Sheet for Patients: SugarRoll.be  Fact Sheet for Healthcare Providers: https://www.woods-mathews.com/  This test is not yet approved or cleared by the [place] FDA and  has been authorized for detection and/or diagnosis of SARS-CoV-2 by FDA under an Emergency Use Authorization (EUA). This EUA will remain  in effect (meaning this test can be used) for the duration of the COVID-19 declaration under Se ction 564(b)(1) of the Act, 21 U.S.C. section 360bbb-3(b)(1), unless the authorization is terminated or revoked sooner.  Performed at [facility], [place] [address] [place], [place] [address]   SARS CORONAVIRUS 2 (TAT 6-24 HRS) Nasopharyngeal Nasopharyngeal Swab     Status: None   Collection Time: 06/14/20  9:50 PM   Specimen: Nasopharyngeal Swab  Result Value Ref Range Status   SARS Coronavirus 2 NEGATIVE NEGATIVE Final    Comment: (NOTE) SARS-CoV-2 target nucleic acids are NOT DETECTED.  The SARS-CoV-2 RNA is generally detectable in upper and lower respiratory specimens during the acute phase  of infection. Negative results do not preclude SARS-CoV-2 infection, do not rule out co-infections with other pathogens, and should not be used as the sole basis for treatment or other patient management decisions. Negative results must be combined with clinical observations, patient history, and epidemiological information. The expected result is Negative.  Fact Sheet for Patients: SugarRoll.be  Fact Sheet for Healthcare Providers: https://www.woods-mathews.com/  This test is not yet approved or cleared by the [place] FDA and  has been authorized for detection and/or diagnosis of SARS-CoV-2 by FDA under an Emergency Use Authorization (EUA). This EUA will remain  in effect (meaning this test can be used) for the duration of the COVID-19 declaration under Se ction 564(b)(1) of the Act, 21 U.S.C. section 360bbb-3(b)(1), unless the authorization is terminated or revoked sooner.  Performed at [facility], [place] [address], [place], [place] [address]      Medications:   . aspirin EC  81 mg Oral Daily  . atorvastatin  40 mg Oral QHS  . cholecalciferol  2,000 Units Oral Q lunch  . docusate sodium  100 mg Oral BID  . enoxaparin (LOVENOX) injection  30 mg Subcutaneous Q24H  . ferrous sulfate  325 mg Oral Q breakfast  . finasteride  5 mg Oral Q lunch  . FLUoxetine  10 mg Oral QHS  . fluticasone  1-2 spray Each Nare Daily  . furosemide  40 mg Intravenous BID  . furosemide  40 mg Oral BID  . Gerhardt's butt cream  Topical BID  . guaiFENesin  600 mg Oral BID  . metoprolol tartrate  12.5 mg Oral TID  . mometasone-formoterol  2 puff Inhalation BID  . multivitamin  1 tablet Oral Daily  . mupirocin ointment   Topical Daily  . pantoprazole  40 mg Oral Daily  . polyethylene glycol  17 g Oral Daily  . polyvinyl alcohol  1 drop Both Eyes TID  . predniSONE  10 mg Oral Q breakfast  . sodium chloride  1 drop Both Eyes QID  . sodium  chloride flush  3 mL Intravenous Q12H  . umeclidinium bromide  1 puff Inhalation Daily  . vitamin B-12  1,000 mcg Oral Daily   Continuous Infusions: . sodium chloride        LOS: 1 day   Charlynne Cousins  [facility]  06/15/2020, 9:08 AM

## 2020-06-15 NOTE — Progress Notes (Signed)
Physical Therapy Treatment Patient Details Name: [name] MRN: [id number] DOB: 06-17-1929 Today's Date: 06/15/2020    History of Present Illness Pt is an 87 y.o. male recently admitted (06/04/20) with NSTEMI and d/c to SNF, now readmitted 06/09/2020 as SNF does not accept pts on Trilogy breathing machine. Pt with chronic LLE and coccyx wounds. Other PMH includes CAD, HF, severe COPD on chronic prednisone therapy, chronic hypoxic respiratory failure (2L O2 baseline), PVD s/p R AKA, HTN, PAF, anemia, chronic foley, depression.   PT Comments    Pt slowly progressing with mobility, remains limited by increased fatigue/lethargy. Today's session focused on transfer training progression, pt requiring mod-maxA for standing trials and squat pivot transfer to recliner. Pt limited by lethargy, as well as generalized weakness, decreased activity tolerance and impaired balance strategies. [name] present and involved in session. Continue to recommend SNF-level therapies to maximize functional mobility and decrease caregiver burden as prior to initial admission, pt performing standing transfers at supervision-level.  SpO2 94% on 2L O2 [name], HR 66    Follow Up Recommendations  SNF;Supervision for mobility/OOB     Equipment Recommendations  None recommended by PT    Recommendations for Other Services       Precautions / Restrictions Precautions Precautions: Fall;Other (comment) Precaution Comments: Fragile skin with chronic LLE/sacral wounds; h/o R AKA; baseline 2L O2; chronic foley Required Braces or Orthoses: Other Brace Other Brace: L post-op shoe (pt prefers to wear for transfers/weight bearing)    Mobility  Bed Mobility Overal bed mobility: Needs Assistance Bed Mobility: Supine to Sit           General bed mobility comments: Pt able to come to long sitting from elevated HOB without assist, modA to initiate BLE movement and scoot hips to EOB, pt limited by fatigue/lethargy requiring  frequent cues to attend to task    Transfers Overall transfer level: Needs assistance   Transfers: Sit to/from [employer] Transfers Sit to Stand: Max assist   Squat pivot transfers: Mod assist     General transfer comment: Pt performed multiple standing trials from EOB, able to offload buttocks well but then not progressing to fully upright posture despite external assist, performed 3x trials of this, reports limited by fatigue; eventually able to perform squat pivot transfer to recliner with modA  Ambulation/Gait                 Stairs             Wheelchair Mobility    Modified Rankin (Stroke Patients Only)       Balance Overall balance assessment: Needs assistance Sitting-balance support: No upper extremity supported;Feet unsupported Sitting balance-Leahy Scale: Fair Sitting balance - Comments: Forward flexed posture     Standing balance-Leahy Scale: Zero                              Cognition Arousal/Alertness: Lethargic Behavior During Therapy: Flat affect Overall Cognitive Status: Impaired/Different from baseline Area of Impairment: Attention;Following commands;Safety/judgement;Awareness;Problem solving                   Current Attention Level: Focused;Sustained   Following Commands: Follows one step commands inconsistently;Follows one step commands with increased time Safety/Judgement: Decreased awareness of safety;Decreased awareness of deficits Awareness: Intellectual Problem Solving: Slow processing;Requires verbal cues;Requires tactile cues General Comments: Wife reports pt typically "sharp" and more cognitively intact when awake; increased lethargy this session (wife suspects related  to medications) requiring frequent cues to keep eyes open and attend to task      Exercises General Exercises - Lower Extremity Long Arc Quad: AROM;Left;Seated    General Comments General comments (skin integrity, edema, etc.):  [name] [name]) present and supportive. Pt seated in recliner on roho cushion (brought by wife) for pressure relief. SpO2 94% on 2L O2 [place], HR 66      Pertinent Vitals/Pain Pain Assessment: Faces Faces Pain Scale: Hurts a little bit Pain Location: Generalized Pain Descriptors / Indicators: Tiring Pain Intervention(s): Monitored during session;Other (comment) (seated on roho cushion)    Home Living                      Prior Function            PT Goals (current goals can now be found in the care plan section) Progress towards PT goals: Progressing toward goals    Frequency    Min 2X/week      PT Plan Frequency needs to be updated    Co-evaluation              AM-PAC PT "6 Clicks" Mobility   Outcome Measure  Help needed turning from your back to your side while in a flat bed without using bedrails?: A Little Help needed moving from lying on your back to sitting on the side of a flat bed without using bedrails?: A Lot Help needed moving to and from a bed to a chair (including a wheelchair)?: A Lot Help needed standing up from a chair using your arms (e.g., wheelchair or bedside chair)?: A Lot Help needed to walk in hospital room?: Total Help needed climbing 3-5 steps with a railing? : Total 6 Click Score: 11    End of Session Equipment Utilized During Treatment: Oxygen;Gait belt Activity Tolerance: Patient limited by fatigue Patient left: in chair;with call bell/phone within reach;with chair alarm set;with family/visitor present Nurse Communication: Mobility status PT Visit Diagnosis: Unsteadiness on feet (R26.81);Muscle weakness (generalized) (M62.81);Adult, failure to thrive (R62.7)     Time: 2263-3354 PT Time Calculation (min) (ACUTE ONLY): 22 min  Charges:  $Therapeutic Activity: 8-22 mins                     [name], PT, DPT Acute Rehabilitation Services  Pager [phone] Office [phone]  Derry Lory 06/15/2020, 11:53  AM

## 2020-06-15 NOTE — Progress Notes (Signed)
Occupational Therapy Evaluation Patient Details Name: [name] MRN: [id number] DOB: December 23, 1929 Today's Date: 06/15/2020    History of Present Illness Pt is an 87 y.o. male recently admitted (06/04/20) with NSTEMI and d/c to SNF, now readmitted 06/27/2020 as SNF does not accept pts on Trilogy breathing machine. Pt with chronic LLE and coccyx wounds. Other PMH includes CAD, HF, severe COPD on chronic prednisone therapy, chronic hypoxic respiratory failure (2L O2 baseline), PVD s/p R AKA, HTN, PAF, anemia, chronic foley, depression.   Clinical Impression   Pt seen with wife present, received sitting in recliner requesting return to bed; wife supportive and providing PLOF And social at bedside, pt demo's decreased activity tolerance, balance, strength and ?cognition limited at times by hard of hearing leading ot need for increased S/A for all transfers and ADL's at this time. Pt requiring max A for self care transfers, and mod A for bed mobility this date, mod-max A for gross UB/LB ADL's. Pt is most appropriate for d/c to post acute at SNF level for further participation with skilled OT services to maximize indep and safety and decreased caregiver burden. OT will continue to follow acutely.     Follow Up Recommendations  Supervision/Assistance - 24 hour;SNF    Equipment Recommendations  None recommended by OT    Recommendations for Other Services       Precautions / Restrictions Precautions Precautions: Fall;Other (comment) Precaution Comments: Fragile skin with chronic LLE/sacral wounds; h/o R AKA; baseline 2L O2; chronic foley Required Braces or Orthoses: Other Brace Other Brace: L post-op shoe (pt prefers to wear for transfers/weight bearing) Restrictions Weight Bearing Restrictions: No      Mobility Bed Mobility Overal bed mobility: Needs Assistance Bed Mobility: Sit to Supine       Sit to supine: Mod assist   General bed mobility comments: Pt able to come to long sitting from  elevated HOB without assist, modA to initiate BLE movement and scoot hips to EOB, pt limited by fatigue/lethargy requiring frequent cues to attend to task    Transfers Overall transfer level: Needs assistance Equipment used: 1 person hand held assist Transfers: Sit to/from [employer] Sit to Stand: Max assist   Squat pivot transfers: Mod assist     General transfer comment: Pt performed multiple standing trials from EOB, able to offload buttocks well but then not progressing to fully upright posture despite external assist, performed 3x trials of this, reports limited by fatigue; eventually able to perform squat pivot transfer to recliner with modA    Balance Overall balance assessment: Needs assistance Sitting-balance support: No upper extremity supported;Feet unsupported Sitting balance-Leahy Scale: Fair Sitting balance - Comments: Forward flexed posture     Standing balance-Leahy Scale: Zero                             ADL either performed or assessed with clinical judgement   ADL Overall ADL's : Needs assistance/impaired         Upper Body Bathing: Minimal assistance   Lower Body Bathing: Moderate assistance;Bed level   Upper Body Dressing : Moderate assistance   Lower Body Dressing: Maximal assistance;Bed level Lower Body Dressing Details (indicate cue type and reason): doffing underwear at bed level Toilet Transfer: Maximal assistance Toilet Transfer Details (indicate cue type and reason): simluated with pivot transfer recliner<>bed Toileting- Clothing Manipulation and Hygiene: Maximal assistance  Vision Baseline Vision/History: Wears glasses Patient Visual Report: No change from baseline       Perception     Praxis      Pertinent Vitals/Pain Pain Assessment: 0-10 Pain Score: 3  Faces Pain Scale: Hurts a little bit Pain Location: scrotum Pain Descriptors / Indicators: Aching;Burning Pain Intervention(s):  Limited activity within patient's tolerance     Hand Dominance Right   Extremity/Trunk Assessment Upper Extremity Assessment Upper Extremity Assessment: Generalized weakness   Lower Extremity Assessment Lower Extremity Assessment: Defer to PT evaluation       Communication Communication Communication: HOH   Cognition Arousal/Alertness: Lethargic Behavior During Therapy: Flat affect Overall Cognitive Status: Impaired/Different from baseline Area of Impairment: Attention;Following commands;Safety/judgement;Awareness;Problem solving                   Current Attention Level: Focused;Sustained   Following Commands: Follows one step commands inconsistently;Follows one step commands with increased time Safety/Judgement: Decreased awareness of safety;Decreased awareness of deficits Awareness: Intellectual Problem Solving: Slow processing;Difficulty sequencing;Decreased initiation;Requires verbal cues General Comments: fair participation, noted fatigue on arrival from sitting upright in recliner this AM. following 1 step commands   General Comments  with return to bed, wife assisted wtih transition to modified sidelying to L for pressure relief with pillow placement for support. pt endorsing comfort.    Exercises General Exercises - Lower Extremity Long Arc Quad: AROM;Left;Seated   Shoulder Instructions      Home Living Family/patient expects to be discharged to:: Private residence Living Arrangements: Spouse/significant other Available Help at Discharge: Family;Available PRN/intermittently Type of Home: House Home Access: Level entry     Home Layout: Multi-level;Bed/bath upstairs Alternate Level Stairs-Number of Steps: Level entry from carport. Has stair lift on each set of steps - 9 steps (w/ 1 rail) to kitchen/dining room/living room; 6 additional steps (w/ 1 rail) to bedroom/bathroom Alternate Level Stairs-Rails: Right Bathroom Shower/Tub: Walk-in  shower;Tub/shower unit   Bathroom Toilet: Standard Bathroom Accessibility: Yes   Home Equipment: Walker - 2 wheels;Cane - single point;Shower seat;Transport chair;Tub bench;Other (comment)          Prior Functioning/Environment Level of Independence: Needs assistance    ADL's / Homemaking Assistance Needed: wife reports assisting pt with personal care as needed Communication / Swallowing Assistance Needed: HOH Comments: wears 2L O2 at baseline        OT Problem List: Decreased strength;Decreased range of motion;Decreased activity tolerance;Impaired balance (sitting and/or standing);Decreased safety awareness;Decreased knowledge of use of DME or AE;Cardiopulmonary status limiting activity;Impaired UE functional use;Pain      OT Treatment/Interventions: Self-care/ADL training;Therapeutic exercise;Neuromuscular education;Energy conservation;DME and/or AE instruction;Therapeutic activities;Patient/family education;Balance training    OT Goals(Current goals can be found in the care plan section) Acute Rehab OT Goals Patient Stated Goal: to move better OT Goal Formulation: With patient/family Time For Goal Achievement: 06/29/20 ADL Goals Pt Will Perform Upper Body Dressing: with set-up;sitting Pt Will Perform Lower Body Dressing: with min assist Pt Will Transfer to Toilet: bedside commode;squat pivot transfer;with min guard assist Pt Will Perform Toileting - Clothing Manipulation and hygiene: with min guard assist;sit to/from stand  OT Frequency: Min 2X/week   Barriers to D/C:            Co-evaluation              AM-PAC OT "6 Clicks" Daily Activity     Outcome Measure Help from another person eating meals?: None Help from another person taking care of personal grooming?: A Little Help from another  person toileting, which includes using toliet, bedpan, or urinal?: Total Help from another person bathing (including washing, rinsing, drying)?: A Lot Help from another  person to put on and taking off regular upper body clothing?: A Lot Help from another person to put on and taking off regular lower body clothing?: A Lot 6 Click Score: 14   End of Session Equipment Utilized During Treatment: Gait belt Nurse Communication: Other (comment);Mobility status  Activity Tolerance: Patient limited by fatigue Patient left: in bed;with bed alarm set;with family/visitor present;with call bell/phone within reach  OT Visit Diagnosis: Other abnormalities of gait and mobility (R26.89);Muscle weakness (generalized) (M62.81);History of falling (Z91.81);Other symptoms and signs involving cognitive function;Pain                Time: 0354-6568 OT Time Calculation (min): 24 min Charges:  OT General Charges $OT Visit: 1 Visit OT Evaluation $OT Eval Low Complexity: 1 Low  Jeanclaude Wentworth OTR/L acute rehab services Office: [phone]  06/15/2020, 1:27 PM

## 2020-06-15 NOTE — TOC Initial Note (Addendum)
Transition of Care [name]) - Initial/Assessment Note    Patient Details  Name: [name] MRN: [id number] Date of Birth: 07/13/1929  [facility]) CM/SW Contact:    Zenon Mayo, RN Phone Number: 06/15/2020, 2:36 PM  Clinical Narrative:                 NCM spoke with wife at bedside she states that patient was discharged from [facility] to  [facility] for 2 weeks of short term rehab, [facility] discharged patient back to [facility] on 6/3 because patient came to facility with his home triology that they can not use.  Wife states he  Has the triology with [facility], pulmonary out of [place] [phone].  Wife states Caryl Pina is his respiratory therapist there. NCM called and left message for Caryl Pina the resp therapist to call  Back. Will need settings for patient's bipap. NCM spoke with a rep at the company and states the resp therapist has been in and out of meetings all day today but she can email me the settings for his bipap. NCM received the email of settings, it does look like he has been on vent settings on the triology, this NCM will see if can speak to his respiratory therapist and see if there is a safe bipap setting for patient.  NCM spoke with Ashely , patient's respiratory therapist, she states patient can not go on a bipap setting, he will need to continue on the vent settings at night.  So there is no SNF that will take patient on vent settings.  NCM asked wife if she would be ok if we looked into ltach for him with [facility], she states ok.  NCM asked Anderson Malta to look to see if this patient would be candidate for [facility].  She states she think he may be she will call me at 8 am tomorrow. She has to see how many medicare days he has left.  [facility] for [facility]  6/8- [facility] is not able to take patient, NCM spoke with Raquel Sarna at [facility] she states they can take patient to [facility].  NCM informed MD of this information. Will be monitoring his NA  level which is low.  Plan to dc by Saturday via Care link to [facility] at [address], [place] [address].   Barriers to Discharge: SNF Pending bed offer   Patient Goals and CMS Choice        Expected Discharge Plan and Services                                                Prior Living Arrangements/Services                       Activities of Daily Living      Permission Sought/Granted                  Emotional Assessment              Admission diagnosis:  CHF (congestive heart failure) ([place]) [I50.9] Patient Active Problem List   Diagnosis Date Noted  . Chronic indwelling Foley catheter 06/15/2020  . CHF (congestive heart failure) ([place]) 06/14/2020  . Hypotension 06/05/2020  . AF (paroxysmal atrial fibrillation) ([place])   . Atrial fibrillation with RVR ([place]) 04/08/2020  .  Fall at home, initial encounter 04/08/2020  . Chronic pain 04/08/2020  . Asterixis 02/27/2020  . Chronic peripheral venous hypertension with lower extremity complication 00/34/9179  . Complete traumatic amputation at level between right hip and knee, initial encounter ([place]) 02/27/2020  . Difficulty in walking, not elsewhere classified 02/27/2020  . Dyspepsia 02/27/2020  . Foot ulcer ([place]) 02/27/2020  . Hyperglycemia 02/27/2020  . Iron deficiency anemia due to chronic blood loss 02/27/2020  . Major depression single episode, in partial remission ([place]) 02/27/2020  . Malnutrition related to chronic disease ([name]) 02/27/2020  . Osteomyelitis ([place]) 02/27/2020  . Paresthesia 02/27/2020  . Right upper quadrant pain 02/27/2020  . Slow transit constipation 02/27/2020  . Spasm of bladder 02/27/2020  . Urinary tract obstruction 02/27/2020  . Vitamin D deficiency 02/27/2020  . Congestive heart failure ([place]) 02/27/2020  . Prediabetes   . Urinary retention   . Normocytic anemia   . Chronic diastolic congestive heart failure ([place])   . Major depressive disorder with  current active episode   . Paroxysmal atrial fibrillation (HCC)   . Right above-knee amputee ([place] ) 02/05/2020  . Typical atrial flutter ([place])   . New onset atrial fibrillation ([place])   . Malnutrition of moderate degree 01/30/2020  . Cellulitis of left foot 01/24/2020  . Cellulitis and abscess of left lower extremity 01/24/2020  . Pain of left heel 09/01/2019  . Labile blood pressure   . Anxiety state   . Hypothyroidism   . Right hip pain   . Sacral decubitus ulcer   . Post-operative pain   . Chronic systolic congestive heart failure ([place])   . Urinary retention due to benign prostatic hyperplasia 08/20/2019  . SOB (shortness of breath)   . Chronic systolic CHF (congestive heart failure) ([place])   . Hypoalbuminemia due to protein-calorie malnutrition ([place])   . Transaminitis   . Acute blood loss anemia   . Hyponatremia   . Postoperative pain   . Supplemental oxygen dependent   . S/P AKA (above knee amputation) ([place]) 08/14/2019  . Peripheral arterial disease ([place]) 08/11/2019  . Critical lower limb ischemia ([place]) 08/11/2019  . DNR (do not resuscitate)   . Voiding difficulty   . Palliative care by specialist   . Pressure injury of skin 06/29/2019  . Cellulitis of left lower extremity 06/28/2019  . Cellulitis of right lower extremity 06/27/2019  . AKI (acute kidney injury) ([place]) 06/27/2019  . Headache 10/16/2018  . Severe major depression without psychotic features ([place]) 09/22/2017  . Generalized anxiety disorder 09/22/2017  . DNR (do not resuscitate) discussion 05/10/2017  . Generalized weakness 04/04/2017  . Chest pain 03/24/2017  . AV block, [place] 1 03/24/2017  . CAD (coronary artery disease) 03/24/2017  . Chronic diastolic CHF (congestive heart failure) ([place]) 03/24/2017  . COPD (chronic obstructive pulmonary disease) ([place]) 03/24/2017  . Demand myocardial infarction ([place])   . Elevated troponin 03/09/2017  . Acute on chronic diastolic CHF (congestive heart failure) ([place])   .  Tachycardia 03/08/2017  . Influenza A 02/04/2017  . Cor pulmonale, chronic (HCC)/ clinical dx  09/13/2016  . Orthostatic hypotension 09/26/2015  . Angina at rest [facility]) 09/24/2015  . BPH (benign prostatic hyperplasia) 09/13/2015  . GERD (gastroesophageal reflux disease) 09/13/2015  . Anxiety disorder 09/13/2015  . Sinusitis, chronic 09/09/2015  . DOE (dyspnea on exertion) 04/15/2015  . Acute heart failure ([place]) 03/18/2015  . Right calf pain 06/18/2014  . Coronary artery disease involving coronary bypass graft of native heart with angina pectoris (  [place])   . Peripheral vascular disease ([place])   . Essential hypertension   . Obstructive sleep apnea   . Rhinitis, chronic 01/29/2007  . COPD   GOLD IV/ 02/steroid dep  01/29/2007  . Chronic respiratory failure with hypoxia and hypercapnia ([place]) 01/29/2007  . Hyperlipidemia    PCP:  [name], MD Pharmacy:   [facility] [place], [place] AT [address] [place] [address] Phone: [phone] Fax: [phone]     Social Determinants of Health (SDOH) Interventions    Readmission Risk Interventions No flowsheet data found.

## 2020-06-15 NOTE — Progress Notes (Signed)
Pt place himself on home trilogy.  2L of 02 bleed in.  Pt resting comfortably

## 2020-06-15 NOTE — Progress Notes (Signed)
OT Cancellation Note  Patient Details Name: [name] MRN: [id number] DOB: March 19, 1929   Cancelled Treatment:    Reason Eval/Treat Not Completed: Fatigue/lethargy limiting ability to participate (pt politely requesting OT return at later time this date, OT will continue with efforts schedule permitting)  Yasmine Kilbourne OTR/L acute rehab services Office: [phone]  06/15/2020, 10:28 AM

## 2020-06-15 NOTE — Progress Notes (Signed)
Pt has home trilogy machine.  Pt sated he doesn't need assistance with it.  RT will monitor

## 2020-06-16 DIAGNOSIS — I5030 Unspecified diastolic (congestive) heart failure: Secondary | ICD-10-CM | POA: Diagnosis present

## 2020-06-16 LAB — BASIC METABOLIC PANEL
Anion gap: 7 (ref 5–15)
BUN: 16 mg/dL (ref 8–23)
CO2: 33 mmol/L — ABNORMAL HIGH (ref 22–32)
Calcium: 8.6 mg/dL — ABNORMAL LOW (ref 8.9–10.3)
Chloride: 82 mmol/L — ABNORMAL LOW (ref 98–111)
Creatinine, Ser: 1.02 mg/dL (ref 0.61–1.24)
GFR, Estimated: >60 mL/min (ref >60)
Glucose, Bld: 77 mg/dL (ref 70–99)
Potassium: 4.1 mmol/L (ref 3.5–5.1)
Sodium: 122 mmol/L — ABNORMAL LOW (ref 135–145)

## 2020-06-16 MED ORDER — ALPRAZOLAM 0.5 MG PO TABS
0.5000 mg | ORAL_TABLET | Freq: Every day | ORAL | Status: DC
  Administered 2020-06-16 – 2020-06-21 (×5): 0.5 mg via ORAL
  Filled 2020-06-16 (×5): qty 1

## 2020-06-16 MED ORDER — ENOXAPARIN SODIUM 40 MG/0.4ML IJ SOSY
40.0000 mg | PREFILLED_SYRINGE | INTRAMUSCULAR | Status: DC
  Administered 2020-06-16 – 2020-06-20 (×5): 40 mg via SUBCUTANEOUS
  Filled 2020-06-16 (×6): qty 0.4

## 2020-06-16 MED ORDER — ALPRAZOLAM 0.25 MG PO TABS
0.2500 mg | ORAL_TABLET | Freq: Two times a day (BID) | ORAL | Status: DC | PRN
  Administered 2020-06-17 – 2020-06-21 (×4): 0.25 mg via ORAL
  Filled 2020-06-16 (×5): qty 1

## 2020-06-16 NOTE — Plan of Care (Signed)
  Problem: Coping: Goal: Level of anxiety will decrease Outcome: Progressing   Problem: Pain Managment: Goal: General experience of comfort will improve Outcome: Progressing   Problem: Safety: Goal: Ability to remain free from injury will improve Outcome: Progressing   

## 2020-06-16 NOTE — Progress Notes (Signed)
[facility] PROGRESS NOTE    Progress Note  [name]  GYK:[id number] DOB: [birth year] DOA: 06/26/2020 PCP: [name], MD     Brief Narrative:   82 white male CAD + CABG Chronic respiratory failure 2/2 COPD (stage IV) on 2 L oxygen (wears trilogy at night) Paroxysmal A. fib CHADS2 score >4 not on AC 2/2 falls/risk of bleeding Bladder cancer + chronic Foley catheter  multiple sacral decubiti + left shin ulcer PAD status post R AKA  Recent hospitalization 5/28-->06/10/2020 with NSTEMI-cardiology recommended medical management given patient's chronic comorbidities  His wife is a retired Marine scientist and his son is radiologist   Assessment/Plan:   Acute on chronic diastolic heart failure Requiring oxygen IV Lasix was discontinued secondary to azotemia-likelihood that swelling secondary to poor albumin state He is becoming hyponatremic likely due to overdiuresis. Not ready currently for transition to hospice-disposition to LTAC when stabilized  Generalized weakness/ambulatory dysfunction: Secondary to generalized debility age and chronic illnesses Has multiple decubiti  Chronic iron deficiency anemia: Follow-up with PCP as an outpatient. Iron studies show strange findings-iron is 292 saturation ratios are 87?   They were low 1 week ago No action at this time  Acute on chronic respiratory failure with hypoxia Now with his baseline of 2 L of oxygen in no acute distress.  Chronic hyponatremia: Now worse likely due to overdiuresis we will hold Lasix. Urine studies are still pending at this time however prior ones from 5/28 show a sodium of less than 10 indicating possible SIADH Nursing is aware to collect a urine specimen We will restrict free water to 1200 cc today  Chronic indwelling Foley catheter: Noted next exchange on 06/21/2020  Multiple pressure ulcers of the sacrum and left shin present on admission no signs of infection: Wound care has been consulted RN  Pressure Injury Documentation: Pressure Injury 01/24/20 Coccyx Mid;Lower Stage 2 -  Partial thickness loss of dermis presenting as a shallow open injury with a red, pink wound bed without slough. (Active)  01/24/20 1445  Location: Coccyx  Location Orientation: Mid;Lower  Staging: Stage 2 -  Partial thickness loss of dermis presenting as a shallow open injury with a red, pink wound bed without slough.  Wound Description (Comments):   Present on Admission: Yes     Pressure Injury 02/06/20 Buttocks Left Stage 2 -  Partial thickness loss of dermis presenting as a shallow open injury with a red, pink wound bed without slough. Above previous wound (Active)  02/06/20 1459  Location: Buttocks  Location Orientation: Left  Staging: Stage 2 -  Partial thickness loss of dermis presenting as a shallow open injury with a red, pink wound bed without slough.  Wound Description (Comments): Above previous wound  Present on Admission: Yes     Pressure Injury 04/08/20 Buttocks Medial Stage 3 -  Full thickness tissue loss. Subcutaneous fat may be visible but bone, tendon or muscle are NOT exposed. (Active)  04/08/20 1500  Location: Buttocks  Location Orientation: Medial  Staging: Stage 3 -  Full thickness tissue loss. Subcutaneous fat may be visible but bone, tendon or muscle are NOT exposed.  Wound Description (Comments):   Present on Admission: Yes     Pressure Injury 06/15/20 Nose Anterior;Medial Unstageable - Full thickness tissue loss in which the base of the injury is covered by slough (yellow, tan, gray, green or brown) and/or eschar (tan, brown or black) in the wound bed. full thickness injury fr (Active)  06/15/20 0900  Location:  Nose  Location Orientation: Anterior;Medial  Staging: Unstageable - Full thickness tissue loss in which the base of the injury is covered by slough (yellow, tan, gray, green or brown) and/or eschar (tan, brown or black) in the wound bed.  Wound Description (Comments): full  thickness injury from trilogy Bipap  Present on Admission: Yes    Estimated body mass index is 22.06 kg/m as calculated from the following:   Height as of this encounter: 5\' 8"  (1.727 m).   Weight as of this encounter: 65.8 kg.   DVT prophylaxis: SBD Family Communication: Discussed with the patient's wife at the bedside Jeremih Dearmas area code [phone] Status is: Inpatient  Remains inpatient appropriate because:Hemodynamically unstable   Dispo: The patient is from: SNF              Anticipated d/c is to: LTAC where he can get trilogy vent              Patient currently is not medically stable to d/c.   Difficult to place patient   Code Status:     Code Status Orders  (From admission, onward)         Start     Ordered   06/14/20 1624  Limited resuscitation (code)  Continuous       Question Answer Comment  In the event of cardiac or respiratory ARREST: Initiate Code Blue, Call Rapid Response Yes   In the event of cardiac or respiratory ARREST: Perform CPR No   In the event of cardiac or respiratory ARREST: Perform Intubation/Mechanical Ventilation No   In the event of cardiac or respiratory ARREST: Use NIPPV/BiPAp only if indicated Yes   In the event of cardiac or respiratory ARREST: Administer ACLS medications if indicated Yes   In the event of cardiac or respiratory ARREST: Perform Defibrillation or Cardioversion if indicated Yes      06/14/20 1625        Code Status History    Date Active Date Inactive Code Status Order ID Comments User Context   06/06/2020 1619 06/10/2020 1820 Partial Code [id number]  Rosezella Rumpf, NP Inpatient   06/05/2020 1918 06/06/2020 1619 Partial Code [id number]  Rhetta Mura, DO ED   04/08/2020 1618 04/10/2020 2039 Partial Code [id number]  Karmen Bongo, MD Inpatient   04/08/2020 1207 04/08/2020 1618 DNR [id number]  Karmen Bongo, MD ED   02/05/2020 1529 02/16/2020 1753 Full Code [id number]  Cathlyn Parsons, PA-C Inpatient    02/05/2020 1529 02/05/2020 1529 Full Code [id number]  Cathlyn Parsons, PA-C Inpatient   01/26/2020 1729 02/05/2020 1520 Full Code [id number]  Aline August, MD Inpatient   01/26/2020 1001 01/26/2020 1729 DNR [id number]  Aline August, MD Inpatient   01/24/2020 1313 01/26/2020 1001 Partial Code [id number] CODE STATUS was verified with patient's wife and patient at bedside Cristal Deer, MD Inpatient   08/21/2019 1601 09/01/2019 1821 Partial Code [id number]  Bary Leriche, PA-C Inpatient   08/21/2019 1601 08/21/2019 1601 Partial Code [id number]  Bary Leriche, PA-C Inpatient   08/21/2019 0037 08/21/2019 1514 Partial Code [id number]  Vianne Bulls, MD Inpatient   08/17/2019 2148 08/20/2019 2124 Partial Code [id number]  Bartholomew Boards, RN Inpatient   08/14/2019 1541 08/17/2019 2148 DNR [id number]  Bary Leriche, PA-C Inpatient   08/11/2019 1741 08/14/2019 1523 DNR [id number]  Norval Morton, MD Inpatient   08/11/2019 1549 08/11/2019 1741 Full Code [id number]  Karoline Caldwell, PA-C Inpatient   07/01/2019 0937 07/03/2019 0044 DNR [id number]  Knox Royalty, NP Inpatient   06/27/2019 2039 07/01/2019 0937 Partial Code [id number]  Lenore Cordia, MD ED   12/03/2017 1959 12/04/2017 2041 Full Code [id number]  Elgergawy, Silver Huguenin, MD Inpatient   04/04/2017 1038 04/05/2017 1915 Full Code [id number]  Dionne Milo, NP ED   03/24/2017 2258 03/26/2017 1930 Full Code [id number]  Vianne Bulls, MD ED   03/08/2017 1808 03/12/2017 2230 Full Code [id number]  Isaiah Serge, NP ED   02/04/2017 1814 02/09/2017 1929 Full Code [id number]  Doreatha Lew, MD ED   09/24/2015 2245 09/26/2015 1738 Full Code [id number]  Isaiah Serge, NP Inpatient   09/13/2015 0240 09/16/2015 1551 Full Code [id number]  Karmen Bongo, MD Inpatient   Advance Care Planning Activity    Advance Directive Documentation   Flowsheet Row Most Recent Value  Type of Advance Directive Out of facility DNR (pink MOST or yellow form)  Pre-existing out of facility DNR order (yellow form  or pink MOST form) --  "MOST" Form in Place? --        IV Access:    Peripheral IV   Procedures and diagnostic studies:   No results found.   Medical Consultants:    None.   Subjective:    Feels weak tired and is not sleeping well Overall feels more swollen He has no chest pain The wound on his left leg does not appear too bad although it is a little deep He does have swelling in his testes and stump as well   Objective:    Vitals:   06/16/20 0517 06/16/20 0712 06/16/20 1153 06/16/20 1449  BP: (!) 95/52 (!) 98/55 109/61 121/64  Pulse: 64 63 62 73  Resp: 18 20 17 19   Temp: 97.9 F (36.6 C) 98.1 F (36.7 C) 97.8 F (36.6 C) 98.4 F (36.9 C)  TempSrc: Oral Oral Oral Oral  SpO2: 91% 91% 100% 100%  Weight: 65.8 kg     Height:       SpO2: 100 % O2 Flow Rate (L/min): 2 L/min   Intake/Output Summary (Last 24 hours) at 06/16/2020 1821 Last data filed at 06/16/2020 1821 Gross per 24 hour  Intake 780 ml  Output 1575 ml  Net -795 ml   Filed Weights   06/15/20 0031 06/15/20 0500 06/16/20 0517  Weight: 61.7 kg 61.7 kg 65.8 kg    Exam:  Coherent pleasant very hard of hearing no icterus no pallor on oxygen throat clear Quite cachectic S1-S2 no murmur No rales rhonchi Has some Mepilex over his back Mepilex over his left leg shows deep 4 cm wound Neurologically intact no deficit Right AKA is clean I did not examine his decubiti on his bottom   Data Reviewed:    Labs: Basic Metabolic Panel: Recent Labs  Lab 06/14/20 2332 06/16/20 0257  NA 123* 122*  K 3.7 4.1  CL 83* 82*  CO2 35* 33*  GLUCOSE 138* 77  BUN 16 16  CREATININE 1.20 1.02  CALCIUM 8.4* 8.6*   GFR Estimated Creatinine Clearance: 46.6 mL/min (by C-G formula based on SCr of 1.02 mg/dL). Liver Function Tests: Recent Labs  Lab 06/15/20 1011  AST 22  ALT 16  ALKPHOS 51  BILITOT 0.7  PROT 4.8*  ALBUMIN 2.8*   No results for input(s): LIPASE, AMYLASE in the last 168  hours. No results for input(s): AMMONIA in the last 168 hours. Coagulation profile No results for input(s): INR, PROTIME in the last 168 hours. COVID-19 Labs  Recent Labs    06/15/20 1011  FERRITIN 132    Lab Results  Component Value Date   SARSCOV2NAA NEGATIVE 06/14/2020   SARSCOV2NAA NEGATIVE 06/09/2020   SARSCOV2NAA NEGATIVE 06/05/2020   Silvana NEGATIVE 04/08/2020    CBC: Recent Labs  Lab 06/14/20 2332  WBC 9.5  HGB 8.6*  HCT 26.5*  MCV 93.6  PLT 298   Cardiac Enzymes: No results for input(s): CKTOTAL, CKMB, CKMBINDEX, TROPONINI in the last 168 hours. BNP (last 3 results) No results for input(s): PROBNP in the last 8760 hours. CBG: No results for input(s): GLUCAP in the last 168 hours. D-Dimer: No results for input(s): DDIMER in the last 72 hours. Hgb A1c: No results for input(s): HGBA1C in the last 72 hours. Lipid Profile: No results for input(s): CHOL, HDL, LDLCALC, TRIG, CHOLHDL, LDLDIRECT in the last 72 hours. Thyroid function studies: No results for input(s): TSH, T4TOTAL, T3FREE, THYROIDAB in the last 72 hours.  Invalid input(s): FREET3 Anemia work up: Recent Labs    06/15/20 1011  VITAMINB12 2,655*  FOLATE 9.7  FERRITIN 132  TIBC 336  IRON 292*  RETICCTPCT 1.9   Sepsis Labs: Recent Labs  Lab 06/14/20 2332  WBC 9.5   Microbiology Recent Results (from the past 240 hour(s))  SARS CORONAVIRUS 2 (TAT 6-24 HRS) Nasopharyngeal Nasopharyngeal Swab     Status: None   Collection Time: 06/09/20  4:48 PM   Specimen: Nasopharyngeal Swab  Result Value Ref Range Status   SARS Coronavirus 2 NEGATIVE NEGATIVE Final    Comment: (NOTE) SARS-CoV-2 target nucleic acids are NOT DETECTED.  The SARS-CoV-2 RNA is generally detectable in upper and lower respiratory specimens during the acute phase of infection. Negative results do not preclude SARS-CoV-2 infection, do not rule out co-infections with other pathogens, and should not be used as the sole  basis for treatment or other patient management decisions. Negative results must be combined with clinical observations, patient history, and epidemiological information. The expected result is Negative.  Fact Sheet for Patients: SugarRoll.be  Fact Sheet for Healthcare Providers: https://www.woods-mathews.com/  This test is not yet approved or cleared by the [place] FDA and  has been authorized for detection and/or diagnosis of SARS-CoV-2 by FDA under an Emergency Use Authorization (EUA). This EUA will remain  in effect (meaning this test can be used) for the duration of the COVID-19 declaration under Se ction 564(b)(1) of the Act, 21 U.S.C. section 360bbb-3(b)(1), unless the authorization is terminated or revoked sooner.  Performed at [facility], [place] [address] [place], [place] [address]   SARS CORONAVIRUS 2 (TAT 6-24 HRS) Nasopharyngeal Nasopharyngeal Swab     Status: None   Collection Time: 06/14/20  9:50 PM   Specimen: Nasopharyngeal Swab  Result Value Ref Range Status   SARS Coronavirus 2 NEGATIVE NEGATIVE Final    Comment: (NOTE) SARS-CoV-2 target nucleic acids are NOT DETECTED.  The SARS-CoV-2 RNA is generally detectable in upper and lower respiratory specimens during the acute phase of infection. Negative results do not preclude SARS-CoV-2 infection, do not rule out co-infections with other pathogens, and should not be used as the sole basis for treatment or other patient management decisions. Negative results must be combined with clinical observations, patient history, and epidemiological information. The expected result is Negative.  Fact Sheet for Patients: SugarRoll.be  Fact Sheet for Healthcare Providers: https://www.woods-mathews.com/  This test is not yet approved or cleared by the [place] FDA and  has been authorized for detection and/or diagnosis  of  SARS-CoV-2 by FDA under an Emergency Use Authorization (EUA). This EUA will remain  in effect (meaning this test can be used) for the duration of the COVID-19 declaration under Se ction 564(b)(1) of the Act, 21 U.S.C. section 360bbb-3(b)(1), unless the authorization is terminated or revoked sooner.  Performed at [facility], [place] [address], [place], [place] [address]      Medications:   . aspirin EC  81 mg Oral Daily  . atorvastatin  40 mg Oral QHS  . budesonide-formoterol  2 puff Inhalation BID  . cholecalciferol  2,000 Units Oral Q lunch  . docusate sodium  100 mg Oral BID  . enoxaparin (LOVENOX) injection  40 mg Subcutaneous Q24H  . ferrous sulfate  325 mg Oral Q breakfast  . finasteride  5 mg Oral Q lunch  . FLUoxetine  10 mg Oral QHS  . fluticasone  1-2 spray Each Nare Daily  . Gerhardt's butt cream   Topical BID  . guaiFENesin  600 mg Oral BID  . metoprolol tartrate  12.5 mg Oral TID  . multivitamin  1 tablet Oral Daily  . mupirocin ointment   Topical Daily  . pantoprazole  40 mg Oral Daily  . polyethylene glycol  17 g Oral Daily  . predniSONE  10 mg Oral Q breakfast  . sodium chloride  1 drop Both Eyes QID  . sodium chloride flush  3 mL Intravenous Q12H  . Polyethyl Glycol-Propyl Glycol  1 drop Both Eyes TID  . Tiotropium Bromide Monohydrate  2 puff Inhalation Q0600  . vitamin B-12  1,000 mcg Oral Daily   Continuous Infusions: . sodium chloride        LOS: 2 days   [name]  [facility]  06/16/2020, 6:21 PM

## 2020-06-16 NOTE — Progress Notes (Signed)
Physical Therapy Treatment Patient Details Name: [name] MRN: [id number] DOB: 06-10-1929 Today's Date: 06/16/2020    History of Present Illness Pt is an 87 y.o. male recently admitted (06/04/20) with NSTEMI and d/c to SNF, now readmitted 06/26/2020 as SNF does not accept pts on Trilogy breathing machine. Pt with chronic LLE and coccyx wounds. Other PMH includes CAD, HF, severe COPD on chronic prednisone therapy, chronic hypoxic respiratory failure (2L O2 baseline), PVD s/p R AKA, HTN, PAF, anemia, chronic foley, depression.   PT Comments    Pt with increased fatigue/lethargy this session, but arousable and agreeable to participate. Pt requiring increased assist this session to perform combo of squat pivot and lateral scoot transfer to recliner; frequent cues to stay awake and attend to task; reports 'difficulty breathing' which improved with seated rest; DOE 2/4. Pt limited by significant fatigue, diffuse weakness, and decreased activity tolerance. Continue to recommend post-acute rehab to maximize functional mobility and decrease caregiver burden.  SpO2 94-95% on 2L O2 [place], HR 71   Follow Up Recommendations  SNF;Supervision for mobility/OOB (LTACH)     Equipment Recommendations  None recommended by PT    Recommendations for Other Services       Precautions / Restrictions Precautions Precautions: Fall;Other (comment) Precaution Comments: Fragile skin with chronic LLE/sacral wounds; h/o R AKA; baseline 2L O2; chronic foley Required Braces or Orthoses: Other Brace Other Brace: L post-op shoe (pt prefers to wear for transfers/weight bearing)    Mobility  Bed Mobility Overal bed mobility: Needs Assistance Bed Mobility: Supine to Sit     Supine to sit: Min assist     General bed mobility comments: Pt able to come to long sitting from elevated HOB without assist, minA to initiate BLE movement and scoot hips to EOB, pt limited by fatigue/lethargy requiring frequent cues to attend to  task    Transfers Overall transfer level: Needs assistance Equipment used: 1 person hand held assist Transfers: Squat Pivot Transfers;Lateral/Scoot Transfers     Squat pivot transfers: Max assist     General transfer comment: Pt performing multiple squat pivots (almost a lateral scoot transfer) from bed to drop arm recliner, heavy reliance on BUE support to offload buttocks with maxA for trunk elevation and stability; pt requiring verbal cues and external assist to reposition LLE with each small pivot scooting buttocks closer to center of chair; once in recliner, pt able to reposition self for final scoot with BUE rail support and minA  Ambulation/Gait                 Stairs             Wheelchair Mobility    Modified Rankin (Stroke Patients Only)       Balance Overall balance assessment: Needs assistance Sitting-balance support: No upper extremity supported;Feet unsupported Sitting balance-Leahy Scale: Fair Sitting balance - Comments: Forward flexed posture     Standing balance-Leahy Scale: Zero                              Cognition Arousal/Alertness: Lethargic Behavior During Therapy: Flat affect Overall Cognitive Status: Impaired/Different from baseline Area of Impairment: Attention;Following commands;Safety/judgement;Awareness;Problem solving                   Current Attention Level: Focused;Sustained   Following Commands: Follows one step commands inconsistently;Follows one step commands with increased time Safety/Judgement: Decreased awareness of safety;Decreased awareness of deficits Awareness: Intellectual Problem  Solving: Slow processing;Difficulty sequencing;Decreased initiation;Requires verbal cues General Comments: Fatigued/lethargic, but arousable; requires frequent cues to stay attended to task and progress mobility; seems limited by fatigue      Exercises General Exercises - Lower Extremity Ankle Circles/Pumps:  AROM;Left;Seated Long Arc Quad: AROM;Left;Seated Straight Leg Raises: AAROM;Left;Seated    General Comments General comments (skin integrity, edema, etc.): Wife present and supportive, concerned with increased lethargy (RN aware). Noted swelling in BUEs, especially hands, encouraged more frequent BUE AROM (pt able to practice performing finger/wrist/elbow flex and ext multiple times). Pt endorses 'difficulty breathing' after transfer that improved with seated rest, DOE 2/4 noted. SpO2 94% on 2L O2 [place], HR 71      Pertinent Vitals/Pain Pain Assessment: Faces Faces Pain Scale: Hurts a little bit Pain Location: Generalized Pain Descriptors / Indicators: Tiring ("terrible") Pain Intervention(s): Limited activity within patient's tolerance    Home Living                      Prior Function            PT Goals (current goals can now be found in the care plan section) Progress towards PT goals: Not progressing toward goals - comment (increased fatigue/lethargy)    Frequency    Min 2X/week      PT Plan Current plan remains appropriate    Co-evaluation              AM-PAC PT "6 Clicks" Mobility   Outcome Measure  Help needed turning from your back to your side while in a flat bed without using bedrails?: A Little Help needed moving from lying on your back to sitting on the side of a flat bed without using bedrails?: A Little Help needed moving to and from a bed to a chair (including a wheelchair)?: A Lot Help needed standing up from a chair using your arms (e.g., wheelchair or bedside chair)?: A Lot Help needed to walk in hospital room?: Total Help needed climbing 3-5 steps with a railing? : Total 6 Click Score: 12    End of Session Equipment Utilized During Treatment: Oxygen;Gait belt Activity Tolerance: Patient limited by fatigue;Patient limited by lethargy Patient left: in chair;with call bell/phone within reach;with chair alarm set;with family/visitor  present (seated on roho cushion) Nurse Communication: Mobility status PT Visit Diagnosis: Unsteadiness on feet (R26.81);Muscle weakness (generalized) (M62.81);Adult, failure to thrive (R62.7)     Time: 8099-8338 PT Time Calculation (min) (ACUTE ONLY): 25 min  Charges:  $Therapeutic Activity: 23-37 mins                     Mabeline Caras, PT, DPT Acute Rehabilitation Services  Pager [phone] Office [place] 06/16/2020, 5:32 PM

## 2020-06-16 NOTE — Hospital Course (Signed)
26 white male CAD + CABG Chronic respiratory failure 2/2 COPD (stage IV) on 2 L oxygen (wears trilogy at night) Paroxysmal A. fib CHADS2 score >4 not on AC 2/2 falls/risk of bleeding Bladder cancer + chronic Foley catheter  multiple sacral decubiti + left shin ulcer PAD status post R AKA  Recent hospitalization 5/28-->06/10/2020 with NSTEMI-cardiology recommended medical management given patient's chronic comorbidities

## 2020-06-16 NOTE — TOC Transition Note (Addendum)
Transition of Care [facility]) - CM/SW Discharge Note   Patient Details  Name: [name] MRN: [id number] Date of Birth: 02/17/1929  Transition of [facility]) CM/SW Contact:  Zenon Mayo, RN Phone Number: 06/16/2020, 12:57 PM   Clinical Narrative:    NCM spoke with wife at bedside and informed her of the information that [facility] would be able to take patient , she is in agreement with this.  He will be discharged to [facility] on Saturday if medically stable, conts to monitor his sodium.  He will go by [facility] on Saturday, he has his own triology that he uses at night on vent settings.    6/10 - patient sodium still being monitored, [facility] will continue to monitor his sodium, his sodium is 122 today.  Plan is to dc to [facility] on Saturday since they can continue to follow his sodium there and Nephrology will be following him also.  Patient will go to room 326, Staff RN to call report to [phone].  Dr. To call report to Dr. Reesa Chew at [phone]. (For MD handoff).  This information was given to MD.   Final next level of care: Long Term Acute Care (LTAC) Barriers to Discharge: Continued Medical Work up   Patient Goals and CMS Choice Patient states their goals for this hospitalization and ongoing recovery are:: LTACH CMS Medicare.gov Compare Post Acute Care list provided to:: Patient Represenative (must comment) Choice offered to / list presented to : Spouse  Discharge Placement                       Discharge Plan and Services                  DME Agency: NA       HH Arranged: NA          Social Determinants of Health (SDOH) Interventions     Readmission Risk Interventions No flowsheet data found.

## 2020-06-16 NOTE — Plan of Care (Signed)
?  Problem: Coping: ?Goal: Level of anxiety will decrease ?Outcome: Progressing ?  ?Problem: Safety: ?Goal: Ability to remain free from injury will improve ?Outcome: Progressing ?  ?

## 2020-06-17 LAB — COMPREHENSIVE METABOLIC PANEL
ALT: 15 U/L (ref 0–44)
AST: 21 U/L (ref 15–41)
Albumin: 2.9 g/dL — ABNORMAL LOW (ref 3.5–5.0)
Alkaline Phosphatase: 55 U/L (ref 38–126)
Anion gap: 8 (ref 5–15)
BUN: 14 mg/dL (ref 8–23)
CO2: 35 mmol/L — ABNORMAL HIGH (ref 22–32)
Calcium: 8.5 mg/dL — ABNORMAL LOW (ref 8.9–10.3)
Chloride: 79 mmol/L — ABNORMAL LOW (ref 98–111)
Creatinine, Ser: 1.04 mg/dL (ref 0.61–1.24)
GFR, Estimated: >60 mL/min (ref >60)
Glucose, Bld: 81 mg/dL (ref 70–99)
Potassium: 4.2 mmol/L (ref 3.5–5.1)
Sodium: 122 mmol/L — ABNORMAL LOW (ref 135–145)
Total Bilirubin: 0.7 mg/dL (ref 0.3–1.2)
Total Protein: 4.9 g/dL — ABNORMAL LOW (ref 6.5–8.1)

## 2020-06-17 LAB — BASIC METABOLIC PANEL
Anion gap: 6 (ref 5–15)
BUN: 15 mg/dL (ref 8–23)
CO2: 34 mmol/L — ABNORMAL HIGH (ref 22–32)
Calcium: 8.1 mg/dL — ABNORMAL LOW (ref 8.9–10.3)
Chloride: 80 mmol/L — ABNORMAL LOW (ref 98–111)
Creatinine, Ser: 0.98 mg/dL (ref 0.61–1.24)
GFR, Estimated: >60 mL/min (ref >60)
Glucose, Bld: 116 mg/dL — ABNORMAL HIGH (ref 70–99)
Potassium: 4.2 mmol/L (ref 3.5–5.1)
Sodium: 120 mmol/L — ABNORMAL LOW (ref 135–145)

## 2020-06-17 LAB — URIC ACID: Uric Acid, Serum: 4.4 mg/dL (ref 3.7–8.6)

## 2020-06-17 LAB — SODIUM, URINE, RANDOM
Sodium, Ur: <10 mmol/L
Sodium, Ur: <10 mmol/L

## 2020-06-17 LAB — OSMOLALITY, URINE
Osmolality, Ur: 265 mOsm/kg — ABNORMAL LOW (ref 300–900)
Osmolality, Ur: 369 mOsm/kg (ref 300–900)

## 2020-06-17 LAB — SODIUM: Sodium: 120 mmol/L — ABNORMAL LOW (ref 135–145)

## 2020-06-17 LAB — OSMOLALITY: Osmolality: 259 mOsm/kg — ABNORMAL LOW (ref 275–295)

## 2020-06-17 LAB — TSH: TSH: 40.337 u[IU]/mL — ABNORMAL HIGH (ref 0.350–4.500)

## 2020-06-17 MED ORDER — SODIUM CHLORIDE 1 G PO TABS
2.0000 g | ORAL_TABLET | Freq: Three times a day (TID) | ORAL | Status: DC
  Filled 2020-06-17: qty 2

## 2020-06-17 MED ORDER — UREA 15 G PO PACK
30.0000 g | PACK | Freq: Two times a day (BID) | ORAL | Status: DC
  Administered 2020-06-17 – 2020-06-21 (×8): 30 g via ORAL
  Filled 2020-06-17 (×10): qty 2

## 2020-06-17 MED ORDER — SODIUM CHLORIDE 0.9 % IV SOLN: INTRAVENOUS | Status: DC

## 2020-06-17 MED ORDER — PREDNISONE 5 MG PO TABS
5.0000 mg | ORAL_TABLET | Freq: Every day | ORAL | Status: DC
  Administered 2020-06-18 – 2020-06-21 (×4): 5 mg via ORAL
  Filled 2020-06-17 (×4): qty 1

## 2020-06-17 NOTE — Progress Notes (Signed)
RT note. Patient will place self on trilogy when ready for bed, RT will continue to monitor

## 2020-06-17 NOTE — Progress Notes (Signed)
Pt resting comfortably on home trilogy

## 2020-06-17 NOTE — Plan of Care (Signed)
  Problem: Pain Managment: Goal: General experience of comfort will improve Outcome: Progressing   Problem: Safety: Goal: Ability to remain free from injury will improve Outcome: Progressing   

## 2020-06-17 NOTE — Consult Note (Signed)
Nephrology Consult   Requesting provider: Nita Sells, MD  Assessment/Recommendations:   Acute on chronic Hyponatremia  -volume status rather difficult to assess. He certainly is edemtaous, but lungs are clear has dry mucosal membranes. May very well be just intravascularly 'dry' - urine studies on 6/8 consistent with hypovolemic hyponatremia which can be an explanation as he was on lasix and does have evidence of contraction alkalosis on labs. Given the fact that his Na dropped with isotonic fluids raises the concern for underlying SIADH (SSRIs would be a concern, recommend reassessing the need of Prozac and possibly discontinue/taper this off. Has other reasons for ADH release I.e. pain from pressure ulcers, chronic sinusitis) -stop NS -starting ure-na 30g BID w/ fluid restriction (1.2-1.5 L/day), if no improvement then---hold fluid restriction and give one dose of tolvaptan -check serial labs (q4-6h), repeat urine osm, urine na, check serum osm, repeat TSH, will check uric acid -if Na <120, would recommend transitioning to a step down or ICU level of care for possible 3%  Acute on chronic CHF -lasix stopped in the context of worsening Na  Generalized edema -could likely be related to him be hypoalbuminemic, push protein. Once able to get back on diuretics can maybe try for lasix+albumin to see if this helps  Metabolic alkalosis -seems to be contraction alkalosis in the context of diuresis which has now been held  Generalized weakness -thought to be related to chronic debility and chronic illness  Chronic hypoxic respiratory failure -now on baseline 2L [place]  Chronic anemia, normocytic -Transfuse for Hgb<7 g/dL  Chronic foley, h/o bladder Ca -foley mgmt per primary service, exchange planned for 6/13  Recommendations conveyed to primary service.   [facility] 06/17/2020 5:51  PM   _____________________________________________________________________________________   History of Present Illness: [name] is a/an 87 y.o. male with a past medical history of CAD s/p CABG, chronic hyponatremia,recent NSTEMI, chronic dCHF, CHRF secondary to COPD on chronic home O2, PVD s/p AKA, paf, chronic anemia, bladder ca who presents to [facility] with SOB and weakness. Received about two doses of lasix and has been held given the fact that his serum sodium decreased. He was trialed on NS at 40cc/hr which resulted in his Na going from 122 to 120. Wife at bedside. He does report chronic pain from his ulcers. He does report low appetite particularly for today. Denies any nausea/vomiting, worsening pain, dizziness, changes in vision.   Medications:  Current Facility-Administered Medications  Medication Dose Route Frequency Provider Last Rate Last Admin   0.9 %  sodium chloride infusion  250 mL Intravenous PRN Wynetta Fines T, MD       0.9 %  sodium chloride infusion   Intravenous Continuous Nita Sells, MD 40 mL/hr at 06/17/20 0851 New Bag at 06/17/20 0851   acetaminophen (TYLENOL) tablet 500 mg  500 mg Oral Q6H PRN Lacretia Leigh, MD   500 mg at 06/16/20 2035   acetaminophen (TYLENOL) tablet 650 mg  650 mg Oral Q4H PRN Wynetta Fines T, MD   650 mg at 06/17/20 1629   albuterol (PROVENTIL) (2.5 MG/3ML) 0.083% nebulizer solution 2.5 mg  2.5 mg Inhalation Q4H PRN Lacretia Leigh, MD       ALPRAZolam Duanne Moron) tablet 0.25 mg  0.25 mg Oral BID PRN Nita Sells, MD   0.25 mg at 06/17/20 0901   ALPRAZolam Duanne Moron) tablet 0.5 mg  0.5 mg Oral QHS Nita Sells, MD   0.5 mg at 06/16/20 2036   aspirin EC  tablet 81 mg  81 mg Oral Daily Lacretia Leigh, MD   81 mg at 06/17/20 5885   atorvastatin (LIPITOR) tablet 40 mg  40 mg Oral QHS Lacretia Leigh, MD   40 mg at 06/16/20 2036   bacitracin ointment   Topical PRN Little, Wenda Overland, MD       budesonide-formoterol (Symbicort)  160-4.5 mcg/acutation inhaler - patient's own supply  2 puff Inhalation BID Skeet Simmer, RPH   2 puff at 06/16/20 2012   calcium carbonate (TUMS - dosed in mg elemental calcium) chewable tablet 200 mg of elemental calcium  1 tablet Oral BID PRN Lacretia Leigh, MD       cholecalciferol (VITAMIN D3) tablet 2,000 Units  2,000 Units Oral Q lunch Lacretia Leigh, MD   2,000 Units at 06/17/20 1330   docusate sodium (COLACE) capsule 100 mg  100 mg Oral BID Wynetta Fines T, MD   100 mg at 06/17/20 0852   enoxaparin (LOVENOX) injection 40 mg  40 mg Subcutaneous Q24H Nita Sells, MD   40 mg at 06/16/20 2036   ferrous sulfate tablet 325 mg  325 mg Oral Q breakfast Little, Wenda Overland, MD   325 mg at 06/17/20 0277   finasteride (PROSCAR) tablet 5 mg  5 mg Oral Q lunch Lacretia Leigh, MD   5 mg at 06/17/20 1329   FLUoxetine (PROZAC) capsule 10 mg  10 mg Oral QHS Lacretia Leigh, MD   10 mg at 06/16/20 2046   fluticasone (FLONASE) 50 MCG/ACT nasal spray 1-2 spray  1-2 spray Each Nare Daily Lacretia Leigh, MD   1 spray at 06/17/20 4128   Gerhardt's butt cream   Topical BID Lacretia Leigh, MD   Given at 06/17/20 1335   guaiFENesin (MUCINEX) 12 hr tablet 600 mg  600 mg Oral BID Little, Wenda Overland, MD   600 mg at 06/17/20 0854   metoprolol tartrate (LOPRESSOR) tablet 12.5 mg  12.5 mg Oral TID Lacretia Leigh, MD   12.5 mg at 06/17/20 1621   multivitamin (PROSIGHT) tablet 1 tablet  1 tablet Oral Daily Fransico Meadow, [place]   1 tablet at 06/17/20 7867   mupirocin ointment (BACTROBAN) 2 %   Topical Daily Lacretia Leigh, MD   Given at 06/17/20 1330   nitroGLYCERIN (NITROSTAT) SL tablet 0.4 mg  0.4 mg Sublingual Q5 min PRN Lacretia Leigh, MD       ondansetron [facility]) injection 4 mg  4 mg Intravenous Q6H PRN Wynetta Fines T, MD       pantoprazole (PROTONIX) EC tablet 40 mg  40 mg Oral Daily Little, Wenda Overland, MD   40 mg at 06/17/20 0852   polyethylene glycol (MIRALAX / GLYCOLAX) packet 17 g  17 g Oral Daily  Little, Wenda Overland, MD   17 g at 06/17/20 0855   predniSONE (DELTASONE) tablet 10 mg  10 mg Oral Q breakfast Valarie Merino, MD   10 mg at 06/17/20 6720   sodium chloride (MURO 128) 5 % ophthalmic solution 1 drop  1 drop Both Eyes QID Lacretia Leigh, MD   1 drop at 06/17/20 1622   sodium chloride flush (NS) 0.9 % injection 3 mL  3 mL Intravenous Q12H Wynetta Fines T, MD   3 mL at 06/17/20 0904   sodium chloride flush (NS) 0.9 % injection 3 mL  3 mL Intravenous PRN Lequita Halt, MD       [START ON 06/18/2020] sodium chloride tablet 2 g  2 g Oral  TID WC Nita Sells, MD       Systane Ultra eye drops - patient's own supply  1 drop Both Eyes TID Skeet Simmer, RPH   1 drop at 06/17/20 1622   Tiotropium bromide monohydrate (Spirva Respimat) aerosol 2.5 mcg/acutation - patient's own supply  2 puff Inhalation Q0600 Skeet Simmer, RPH       vitamin B-12 (CYANOCOBALAMIN) tablet 1,000 mcg  1,000 mcg Oral Daily Lacretia Leigh, MD   1,000 mcg at 06/17/20 5397     ALLERGIES Cefdinir, Nitrofurantoin, Sulfa antibiotics, Sulfonamide derivatives, Tape, Ciprofloxacin, Nitrofurantoin, Amoxicillin er, Amoxicillin-pot clavulanate, Doxycycline, Keflex [cephalexin], Mirtazapine, Ranolazine, Chlorhexidine gluconate, Levaquin [levofloxacin], and Sertraline  MEDICAL HISTORY Past Medical History:  Diagnosis Date   Anxiety    Arthritis    "generalized" (04/04/2017)   Asthma    Atrial fibrillation (HCC)    Basal cell carcinoma    "left ear"   BPH (benign prostatic hyperplasia)    severe; s/p multiple biopsies   CAD (coronary artery disease)    Carotid artery disease (HCC)    Chronic diastolic CHF (congestive heart failure) ([place]) 2019   Chronic pain 04/08/2020   Chronic respiratory failure (HCC)    Chronic rhinitis    COPD (chronic obstructive pulmonary disease) (HCC)    2L [place] O2   Depression    Dilation of biliary tract    Elevated troponin 03/09/2017   Fall 04/08/2020   Gallstones    GERD  (gastroesophageal reflux disease)    History of blood transfusion    "w/his CABG" (04/04/2017)   History of kidney stones    Hyperlipidemia    Hypertension    On home oxygen therapy    "~ 24/7" (04/04/2017)   Peripheral vascular disease ([place])    Pneumonia 2019   RBBB    Squamous cell carcinoma of skin 04/23/2013   in situ-Right flank (txpbx)   Squamous cell carcinoma of skin 01/23/2017   [place]/ in siut- Left ear rim (CX3FU)   Syncope and collapse    Ureteral tumor 08/2015   had endoscopic procedure for evaluation, unable to reach for biopsy   Urinary catheter (Foley) change required      SOCIAL HISTORY Social History   Socioeconomic History   Marital status: Married    Spouse name: Lorik Guo   Number of children: 2   Years of education: 12   Highest education level: 12th grade  Occupational History   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         Social Determinants of Health   Financial Resource Strain: Low Risk    Difficulty of Paying Living Expenses: Not hard at all  Food Insecurity: No Food Insecurity  Worried About Charity fundraiser in the Last Year: Never true   [place] in the Last Year: Never true  Transportation Needs: No Transportation Needs   Lack of Transportation (Medical): No   Lack of Transportation (Non-Medical): No   Physical Activity: Inactive   Days of Exercise per Week: 0 days   Minutes of Exercise per Session: 0 min  Stress: No Stress Concern Present   Feeling of Stress : Only a little  Social Connections: Moderately Integrated   Frequency of Communication with Friends and Family: More than three times a week   Frequency of Social Gatherings with Friends and Family: More than three times a week   Attends Religious Services: More than 4 times per year   Active Member of [employer] or Organizations: No   Attends Music therapist: Never   Marital Status: Married  Human resources officer Violence: Not At Risk   Fear of Current or Ex-Partner: No   Emotionally Abused: No   Physically Abused: No   Sexually Abused: No     FAMILY HISTORY Family History  Problem Relation Age of Onset   Heart disease Mother    Heart disease Father        Before age 70   Breast cancer Mother    Cancer Mother        Breast cancer   Hypertension Mother    Other Mother        AAA  and   Amputation   Heart attack Father    Stroke Brother        x3, still living    Peripheral vascular disease Brother    Heart disease Brother    Hyperlipidemia Brother    Hypertension Brother    Heart disease Brother    Allergies Brother      Review of Systems: 12 systems reviewed Otherwise as per HPI, all other systems reviewed and negative  Physical Exam: Vitals:   06/17/20 1112 06/17/20 1626  BP: 100/63 123/71  Pulse: 73 72  Resp: 18 19  Temp: 97.6 F (36.4 C) 98.6 F (37 C)  SpO2: 98% 100%   Total I/O In: 366.1 [P.O.:240; I.V.:126.1] Out: 500 [Urine:500]  Intake/Output Summary (Last 24 hours) at 06/17/2020 1751 Last data filed at 06/17/2020 1328 Gross per 24 hour  Intake 846.06 ml  Output 1325 ml  Net -478.94 ml   General: NAD, chronically ill appearing, cachectic HEENT: anicteric sclera, oropharynx clear without lesions, dry MMM CV: regular rate, normal rhythm, no murmurs, no gallops, no rubs Lungs:  poor air exchange diffusely, normal work of breathing, on 2L Abd: soft, non-tender, non-distended Skin: multiple areas of ecchymosis, left ant shin dressing in place, +skin tenting Musculoskeletal: rt AKA, +rt stump edema Neuro: normal speech, no gross focal deficits, hard of hearing GU: scrotal edema  Test Results Reviewed Lab Results  Component Value Date   NA 120 (L) 06/17/2020   K 4.2 06/17/2020   CL 80 (L) 06/17/2020   CO2 34 (H) 06/17/2020   BUN 15 06/17/2020   CREATININE 0.98 06/17/2020   GFR 60.22 03/01/2018   CALCIUM 8.1 (L) 06/17/2020   ALBUMIN 2.9 (L) 06/17/2020   PHOS 2.6 06/30/2019     I have reviewed all relevant outside healthcare records related to the patient's kidney injury.

## 2020-06-17 NOTE — Progress Notes (Signed)
Occupational Therapy Treatment Patient Details Name: [name] MRN: [id number] DOB: Mar 27, 1929 Today's Date: 06/17/2020    History of present illness Pt is an 87 y.o. male recently admitted (06/04/20) with NSTEMI and d/c to SNF, now readmitted 06/10/2020 as SNF does not accept pts on Trilogy breathing machine. Pt with chronic LLE and coccyx wounds. Other PMH includes CAD, HF, severe COPD on chronic prednisone therapy, chronic hypoxic respiratory failure (2L O2 baseline), PVD s/p R AKA, HTN, PAF, anemia, chronic foley, depression.   OT comments  Pt progressing slowly towards acute OT goals. Pt initially agreeable to working on transfers with OOB to recliner but ultimately declined d/t fatigue, feeling poorly, and pt report of breathing difficulty. Spouse present throughout session. Placed bed into chair position. Provided hand held squeeze ball to alternate reps between each hand. D/c plan remains appropriate.    Follow Up Recommendations  Supervision/Assistance - 24 hour;SNF    Equipment Recommendations  None recommended by OT    Recommendations for Other Services      Precautions / Restrictions Precautions Precautions: Fall;Other (comment) Precaution Comments: Fragile skin with chronic LLE/sacral wounds; h/o R AKA; baseline 2L O2; chronic foley Required Braces or Orthoses: Other Brace Other Brace: L post-op shoe (pt prefers to wear for transfers/weight bearing) Restrictions Weight Bearing Restrictions: No       Mobility Bed Mobility Overal bed mobility: Needs Assistance Bed Mobility: Rolling Rolling: Min assist         General bed mobility comments: rolled to each side for RN to provide skin care and repositioning. Pt able to use LLEto facilitate scooting up in bed.    Transfers                 General transfer comment: Initially planned lateral scoots to recliner but pt ultimately declined 2/2 fatigue, feeling poorly, and pt reports of difficulty breathing     Balance                                           ADL either performed or assessed with clinical judgement   ADL Overall ADL's : Needs assistance/impaired                                       General ADL Comments: Rolled to each side, ultimately declined EOB/OOB. Bed placed in chair position     Vision       Perception     Praxis      Cognition Arousal/Alertness: Lethargic;Awake/alert (falls asleep quickly) Behavior During Therapy: Flat affect Overall Cognitive Status: Impaired/Different from baseline Area of Impairment: Attention;Following commands;Safety/judgement;Awareness;Problem solving                   Current Attention Level: Focused;Sustained   Following Commands: Follows one step commands inconsistently;Follows one step commands with increased time Safety/Judgement: Decreased awareness of safety;Decreased awareness of deficits Awareness: Intellectual Problem Solving: Slow processing;Difficulty sequencing;Decreased initiation;Requires verbal cues          Exercises Other Exercises Other Exercises: provided hand held squeeze ball for pt to alternate between hands   Shoulder Instructions       General Comments      Pertinent Vitals/ Pain       Pain Assessment: Faces Faces Pain Scale: Hurts a little bit Pain  Location: Generalized Pain Descriptors / Indicators: Discomfort Pain Intervention(s): Monitored during session  Home Living                                          Prior Functioning/Environment              Frequency  Min 2X/week        Progress Toward Goals  OT Goals(current goals can now be found in the care plan section)  Progress towards OT goals: Progressing toward goals (slowly)  Acute Rehab OT Goals Patient Stated Goal: to move better OT Goal Formulation: With patient/family Time For Goal Achievement: 06/29/20 Potential to Achieve Goals: Good ADL Goals Pt  Will Perform Lower Body Bathing: with min assist;bed level Pt Will Perform Upper Body Dressing: with set-up;sitting Pt Will Perform Lower Body Dressing: with min assist Pt Will Transfer to Toilet: bedside commode;squat pivot transfer;with min guard assist Pt Will Perform Toileting - Clothing Manipulation and hygiene: with min guard assist;sit to/from stand  Plan Discharge plan remains appropriate    Co-evaluation                 AM-PAC OT "6 Clicks" Daily Activity     Outcome Measure   Help from another person eating meals?: None Help from another person taking care of personal grooming?: A Little Help from another person toileting, which includes using toliet, bedpan, or urinal?: Total Help from another person bathing (including washing, rinsing, drying)?: A Lot Help from another person to put on and taking off regular upper body clothing?: A Lot Help from another person to put on and taking off regular lower body clothing?: A Lot 6 Click Score: 14    End of Session    OT Visit Diagnosis: Other abnormalities of gait and mobility (R26.89);Muscle weakness (generalized) (M62.81);History of falling (Z91.81);Other symptoms and signs involving cognitive function;Pain   Activity Tolerance Patient limited by fatigue;Other (comment) (pt report of difficulty breathing)   Patient Left in bed;with bed alarm set;with family/visitor present;with call bell/phone within reach;Other (comment) (bed in chair position)   Nurse Communication          Time: 2440-1027 OT Time Calculation (min): 22 min  Charges: OT General Charges $OT Visit: 1 Visit OT Treatments $Therapeutic Activity: 8-22 mins  Tyrone Schimke, OT Acute Rehabilitation Services Pager: [phone] Office: [phone]    Hortencia Pilar 06/17/2020, 2:14 PM

## 2020-06-17 NOTE — Progress Notes (Signed)
[facility] PROGRESS NOTE    Progress Note  [name]  RUE:[id number] DOB: [birth year] DOA: 06/27/2020 PCP: [name], MD   45 minutes including care coordination time  Brief Narrative:   51 white male CAD + CABG Chronic respiratory failure 2/2 COPD (stage IV) on 2 L oxygen (wears trilogy at night). Paroxysmal A. fib CHADS2 score >4 not on AC 2/2 falls/risk of bleeding Bladder cancer + chronic Foley catheter  multiple sacral decubiti + left shin ulcer PAD status post R AKA  Recent hospitalization 5/28-->06/10/2020 with NSTEMI-cardiology recommended medical management given patient's chronic comorbidities  His wife is a retired Marine scientist and his son is Stage manager in [place]   Assessment/Plan:   Acute on chronic diastolic heart failure Requiring oxygen IV Lasix was discontinued secondary to azotemia-likelihood that swelling secondary to poor albumin state Not ready currently for transition to hospice-disposition to LTAC when stabilized  Generalized weakness/ambulatory dysfunction: BMI 21 with moderate to severe malnutrition in the setting of hypoalbuminemia Secondary to generalized debility age and chronic illnesses Has multiple decubiti as documented below  Paroxysmal atrial fibrillation CHADS2 score >4 has bled score >3 secondary to risk of falls not on DOAC Rate controlled at this time Keep on monitors-continue metoprolol 12.5 3 times daily Check a.m. magnesium  Chronic iron deficiency anemia: Follow-up with PCP as an outpatient. Iron studies show strange findings-iron is 292 saturation ratios are 87?   They were low 1 week ago No action at this time  Acute on chronic respiratory failure with hypoxia Now with his baseline of 2 L of oxygen in no acute distress.  We will start tapering his steroids From 10 mg to 5 mg   Acute superimposed on hypovolemic hyponatremia ?due to overdiuresis we will hold Lasix. In the past patient was placed on a medication  in 2021 for low sodium?  Samsca We have restricted his fluid intake today I started saline at 40 cc an hour and is sodium has dropped from 122-120 I discussed the case with Dr. Candiss Norse of nephrology whose recommendations are appreciated in advance We will hold on further aquaresis with Lasix until he is seen by them and I will repeat his urine studies stat now Follow uric acid, TSH, cortisol  Chronic indwelling Foley catheter: Noted next exchange on 06/21/2020  Multiple pressure ulcers of the sacrum and left shin present on admission no signs of infection: Wound care has been consulted RN Pressure Injury Documentation: Pressure Injury 01/24/20 Coccyx Mid;Lower Stage 2 -  Partial thickness loss of dermis presenting as a shallow open injury with a red, pink wound bed without slough. (Active)  01/24/20 1445  Location: Coccyx  Location Orientation: Mid;Lower  Staging: Stage 2 -  Partial thickness loss of dermis presenting as a shallow open injury with a red, pink wound bed without slough.  Wound Description (Comments):   Present on Admission: Yes     Pressure Injury 02/06/20 Buttocks Left Stage 2 -  Partial thickness loss of dermis presenting as a shallow open injury with a red, pink wound bed without slough. Above previous wound (Active)  02/06/20 1459  Location: Buttocks  Location Orientation: Left  Staging: Stage 2 -  Partial thickness loss of dermis presenting as a shallow open injury with a red, pink wound bed without slough.  Wound Description (Comments): Above previous wound  Present on Admission: Yes     Pressure Injury 04/08/20 Buttocks Medial Stage 3 -  Full thickness tissue loss. Subcutaneous fat may be visible  but bone, tendon or muscle are NOT exposed. (Active)  04/08/20 1500  Location: Buttocks  Location Orientation: Medial  Staging: Stage 3 -  Full thickness tissue loss. Subcutaneous fat may be visible but bone, tendon or muscle are NOT exposed.  Wound Description  (Comments):   Present on Admission: Yes     Pressure Injury 06/15/20 Nose Anterior;Medial Unstageable - Full thickness tissue loss in which the base of the injury is covered by slough (yellow, tan, gray, green or brown) and/or eschar (tan, brown or black) in the wound bed. full thickness injury fr (Active)  06/15/20 0900  Location: Nose  Location Orientation: Anterior;Medial  Staging: Unstageable - Full thickness tissue loss in which the base of the injury is covered by slough (yellow, tan, gray, green or brown) and/or eschar (tan, brown or black) in the wound bed.  Wound Description (Comments): full thickness injury from trilogy Bipap  Present on Admission: Yes    Estimated body mass index is 21.76 kg/m as calculated from the following:   Height as of this encounter: 5\' 8"  (1.727 m).   Weight as of this encounter: 64.9 kg.   DVT prophylaxis: SBD Family Communication: Discussed with the patient's wife at the bedside [name] area code [phone] Also subsequently discussed with [name] [phone]  Status is: Inpatient  Remains inpatient appropriate because:Hemodynamically unstable   Dispo: The patient is from: SNF              Anticipated d/c is to: LTAC where he can get trilogy vent              Patient currently is not medically stable to d/c.   Difficult to place patient   Code Status:     Code Status Orders  (From admission, onward)           IV Access:   Peripheral IV   Procedures and diagnostic studies:   No results found.   Medical Consultants:   None.   Subjective:    Still feels tired Still states he has some insomnia Complains about swelling in his arms and legs I explained to him that this is probably not just volume but lack of albumin store We talked about his low sodium and his wife was present at the time-I have liberalized his diet to full salt and we started fluids this morning Later his sodium dropped even further  and we spoke to nephrology as below I have spoken with the son [name] this afternoon to update him on his dad's condition   Objective:    Vitals:   06/17/20 0448 06/17/20 0727 06/17/20 1112 06/17/20 1626  BP: 117/71 (!) 100/54 100/63 123/71  Pulse: (!) 53 88 73 72  Resp: 15 18 18 19   Temp: 97.9 F (36.6 C) 97.8 F (36.6 C) 97.6 F (36.4 C) 98.6 F (37 C)  TempSrc: Oral Oral Oral Oral  SpO2: 95% 98% 98% 100%  Weight:      Height:       SpO2: 100 % O2 Flow Rate (L/min): 2 L/min   Intake/Output Summary (Last 24 hours) at 06/17/2020 1748 Last data filed at 06/17/2020 1328 Gross per 24 hour  Intake 846.06 ml  Output 1325 ml  Net -478.94 ml    Filed Weights   06/15/20 0500 06/16/20 0517 06/17/20 0008  Weight: 61.7 kg 65.8 kg 64.9 kg    Exam:  Coherent pleasant very hard of hearing no icterus no pallor on oxygen throat  clear Cachectic frail and still swollen upper and lower extremities S1-S2 no murmur Telemetry = PVCs alternating with atrial flutter rate controlled Right BKA wound not examined He does have swelling in his thigh in addition I did not examine his sacral wounds  Data Reviewed:    Labs: Basic Metabolic Panel: Recent Labs  Lab 06/14/20 2332 06/16/20 0257 06/17/20 0225 06/17/20 1506  NA 123* 122* 122* 120*  K 3.7 4.1 4.2 4.2  CL 83* 82* 79* 80*  CO2 35* 33* 35* 34*  GLUCOSE 138* 77 81 116*  BUN 16 16 14 15   CREATININE 1.20 1.02 1.04 0.98  CALCIUM 8.4* 8.6* 8.5* 8.1*    GFR Estimated Creatinine Clearance: 47.8 mL/min (by C-G formula based on SCr of 0.98 mg/dL). Liver Function Tests: Recent Labs  Lab 06/15/20 1011 06/17/20 0225  AST 22 21  ALT 16 15  ALKPHOS 51 55  BILITOT 0.7 0.7  PROT 4.8* 4.9*  ALBUMIN 2.8* 2.9*    No results for input(s): LIPASE, AMYLASE in the last 168 hours. No results for input(s): AMMONIA in the last 168 hours. Coagulation profile No results for input(s): INR, PROTIME in the last 168 hours. COVID-19  Labs  Recent Labs    06/15/20 1011  FERRITIN 132     Lab Results  Component Value Date   SARSCOV2NAA NEGATIVE 06/14/2020   SARSCOV2NAA NEGATIVE 06/09/2020   SARSCOV2NAA NEGATIVE 06/05/2020   [place] NEGATIVE 04/08/2020    CBC: Recent Labs  Lab 06/14/20 2332  WBC 9.5  HGB 8.6*  HCT 26.5*  MCV 93.6  PLT 298    Cardiac Enzymes: No results for input(s): CKTOTAL, CKMB, CKMBINDEX, TROPONINI in the last 168 hours. BNP (last 3 results) No results for input(s): PROBNP in the last 8760 hours. CBG: No results for input(s): GLUCAP in the last 168 hours. D-Dimer: No results for input(s): DDIMER in the last 72 hours. Hgb A1c: No results for input(s): HGBA1C in the last 72 hours. Lipid Profile: No results for input(s): CHOL, HDL, LDLCALC, TRIG, CHOLHDL, LDLDIRECT in the last 72 hours. Thyroid function studies: No results for input(s): TSH, T4TOTAL, T3FREE, THYROIDAB in the last 72 hours.  Invalid input(s): FREET3 Anemia work up: Recent Labs    06/15/20 1011  VITAMINB12 2,655*  FOLATE 9.7  FERRITIN 132  TIBC 336  IRON 292*  RETICCTPCT 1.9    Sepsis Labs: Recent Labs  Lab 06/14/20 2332  WBC 9.5    Microbiology Recent Results (from the past 240 hour(s))  SARS CORONAVIRUS 2 (TAT 6-24 HRS) Nasopharyngeal Nasopharyngeal Swab     Status: None   Collection Time: 06/09/20  4:48 PM   Specimen: Nasopharyngeal Swab  Result Value Ref Range Status   SARS Coronavirus 2 NEGATIVE NEGATIVE Final    Comment: (NOTE) SARS-CoV-2 target nucleic acids are NOT DETECTED.  The SARS-CoV-2 RNA is generally detectable in upper and lower respiratory specimens during the acute phase of infection. Negative results do not preclude SARS-CoV-2 infection, do not rule out co-infections with other pathogens, and should not be used as the sole basis for treatment or other patient management decisions. Negative results must be combined with clinical observations, patient history, and  epidemiological information. The expected result is Negative.  Fact Sheet for Patients: SugarRoll.be  Fact Sheet for Healthcare Providers: https://www.woods-mathews.com/  This test is not yet approved or cleared by the [place] FDA and  has been authorized for detection and/or diagnosis of SARS-CoV-2 by FDA under an Emergency Use Authorization (EUA). This EUA  will remain  in effect (meaning this test can be used) for the duration of the COVID-19 declaration under Se ction 564(b)(1) of the Act, 21 U.S.C. section 360bbb-3(b)(1), unless the authorization is terminated or revoked sooner.  Performed at [facility], [place] [address] [place], [place] [address]   SARS CORONAVIRUS 2 (TAT 6-24 HRS) Nasopharyngeal Nasopharyngeal Swab     Status: None   Collection Time: 06/14/20  9:50 PM   Specimen: Nasopharyngeal Swab  Result Value Ref Range Status   SARS Coronavirus 2 NEGATIVE NEGATIVE Final    Comment: (NOTE) SARS-CoV-2 target nucleic acids are NOT DETECTED.  The SARS-CoV-2 RNA is generally detectable in upper and lower respiratory specimens during the acute phase of infection. Negative results do not preclude SARS-CoV-2 infection, do not rule out co-infections with other pathogens, and should not be used as the sole basis for treatment or other patient management decisions. Negative results must be combined with clinical observations, patient history, and epidemiological information. The expected result is Negative.  Fact Sheet for Patients: SugarRoll.be  Fact Sheet for Healthcare Providers: https://www.woods-mathews.com/  This test is not yet approved or cleared by the [place] FDA and  has been authorized for detection and/or diagnosis of SARS-CoV-2 by FDA under an Emergency Use Authorization (EUA). This EUA will remain  in effect (meaning this test can be used) for the duration of  the COVID-19 declaration under Se ction 564(b)(1) of the Act, 21 U.S.C. section 360bbb-3(b)(1), unless the authorization is terminated or revoked sooner.  Performed at [facility], [place] [address], [place], [place] [address]      Medications:    ALPRAZolam  0.5 mg Oral QHS   aspirin EC  81 mg Oral Daily   atorvastatin  40 mg Oral QHS   budesonide-formoterol  2 puff Inhalation BID   cholecalciferol  2,000 Units Oral Q lunch   docusate sodium  100 mg Oral BID   enoxaparin (LOVENOX) injection  40 mg Subcutaneous Q24H   ferrous sulfate  325 mg Oral Q breakfast   finasteride  5 mg Oral Q lunch   FLUoxetine  10 mg Oral QHS   fluticasone  1-2 spray Each Nare Daily   Gerhardt's butt cream   Topical BID   guaiFENesin  600 mg Oral BID   metoprolol tartrate  12.5 mg Oral TID   multivitamin  1 tablet Oral Daily   mupirocin ointment   Topical Daily   pantoprazole  40 mg Oral Daily   polyethylene glycol  17 g Oral Daily   predniSONE  10 mg Oral Q breakfast   sodium chloride  1 drop Both Eyes QID   sodium chloride flush  3 mL Intravenous Q12H   [START ON 06/18/2020] sodium chloride  2 g Oral TID WC   Polyethyl Glycol-Propyl Glycol  1 drop Both Eyes TID   Tiotropium Bromide Monohydrate  2 puff Inhalation Q0600   vitamin B-12  1,000 mcg Oral Daily   Continuous Infusions:  sodium chloride     sodium chloride 40 mL/hr at 06/17/20 0851       LOS: 3 days   Jai-Gurmukh Gillie Fleites  [facility]  06/17/2020, 5:48 PM

## 2020-06-18 LAB — RENAL FUNCTION PANEL
Albumin: 3 g/dL — ABNORMAL LOW (ref 3.5–5.0)
Anion gap: 6 (ref 5–15)
BUN: 51 mg/dL — ABNORMAL HIGH (ref 8–23)
CO2: 35 mmol/L — ABNORMAL HIGH (ref 22–32)
Calcium: 9 mg/dL (ref 8.9–10.3)
Chloride: 80 mmol/L — ABNORMAL LOW (ref 98–111)
Creatinine, Ser: 1.07 mg/dL (ref 0.61–1.24)
GFR, Estimated: >60 mL/min (ref >60)
Glucose, Bld: 129 mg/dL — ABNORMAL HIGH (ref 70–99)
Phosphorus: 2 mg/dL — ABNORMAL LOW (ref 2.5–4.6)
Potassium: 4 mmol/L (ref 3.5–5.1)
Sodium: 121 mmol/L — ABNORMAL LOW (ref 135–145)

## 2020-06-18 LAB — SODIUM
Sodium: 122 mmol/L — ABNORMAL LOW (ref 135–145)
Sodium: 122 mmol/L — ABNORMAL LOW (ref 135–145)
Sodium: 123 mmol/L — ABNORMAL LOW (ref 135–145)
Sodium: 124 mmol/L — ABNORMAL LOW (ref 135–145)

## 2020-06-18 LAB — T4, FREE: Free T4: 0.27 ng/dL — ABNORMAL LOW (ref 0.61–1.12)

## 2020-06-18 MED ORDER — ALBUMIN HUMAN 25 % IV SOLN
12.5000 g | Freq: Once | INTRAVENOUS | Status: AC
  Administered 2020-06-18: 12.5 g via INTRAVENOUS
  Filled 2020-06-18: qty 50

## 2020-06-18 MED ORDER — ALBUMIN HUMAN 25 % IV SOLN
25.0000 g | Freq: Once | INTRAVENOUS | Status: AC
  Administered 2020-06-18: 25 g via INTRAVENOUS
  Filled 2020-06-18: qty 100

## 2020-06-18 MED ORDER — LEVOTHYROXINE SODIUM 50 MCG PO TABS
50.0000 ug | ORAL_TABLET | Freq: Every day | ORAL | Status: DC
  Administered 2020-06-19 – 2020-06-21 (×3): 50 ug via ORAL
  Filled 2020-06-18 (×3): qty 1

## 2020-06-18 MED ORDER — FUROSEMIDE 10 MG/ML IJ SOLN
40.0000 mg | Freq: Once | INTRAMUSCULAR | Status: AC
  Administered 2020-06-18: 40 mg via INTRAVENOUS
  Filled 2020-06-18: qty 4

## 2020-06-18 NOTE — Consult Note (Signed)
[facility] CM Inpatient Consult   06/18/2020  [name] January 12, 1929 [id number]  Patient chart has been reviewed for readmissions less than 30 days and for high risk score for unplanned readmissions.  Patient assessed for community [place] Management follow up needs.  Chart review reveals current patient disposition is for LTAC.  No [facility] Care Management needs.  Netta Cedars, MSN, [facility] Liaison Nurse Mobile Phone [phone]  Toll free office [phone]

## 2020-06-18 NOTE — TOC Progression Note (Addendum)
Transition of Care [facility]) - Progression Note    Patient Details  Name: [name] MRN: [id number] Date of Birth: 1929/02/26  Transition of Care [facility]) CM/SW Contact  Zenon Mayo, RN Phone Number: 06/18/2020, 2:35 PM  Clinical Narrative:    Patient will dc to [facility] tomorrow to room 326 via [facility]  [phone]., Staff RN to call report to [phone].    Medical necessity is in packet on unit, will need to add the EMTALA and DC summary to packet and will need to call [facility] for transport.  16:32- Per MD, wife states she does not want patient to go to ltach until his sodium is up.  MD says it may be up by Monday. NCM informed Clarise Cruz at [facility] who is covering for [place] today.      Barriers to Discharge: Continued Medical Work up  Expected Discharge Plan and Services                             DME Agency: NA       HH Arranged: NA           Social Determinants of Health (SDOH) Interventions    Readmission Risk Interventions No flowsheet data found.

## 2020-06-18 NOTE — Progress Notes (Signed)
Patient ID: [name], male   DOB: 13-Sep-1929, 87 y.o.   MRN: [id number] S: Feels miserable today.  Was oozing from his edema last night. Wife is at bedside O:BP (!) 130/51 (BP Location: Right Arm)   Pulse 66   Temp 97.7 F (36.5 C) (Oral)   Resp 19   Ht 5\' 8"  (1.727 m)   Wt 65.9 kg   SpO2 100%   BMI 22.09 kg/m   Intake/Output Summary (Last 24 hours) at 06/18/2020 1247 Last data filed at 06/18/2020 0354 Gross per 24 hour  Intake 979.42 ml  Output 1200 ml  Net -220.58 ml   Intake/Output: I/O last 3 completed shifts: In: 1345.5 [P.O.:960; I.V.:385.5] Out: 2025 [Urine:2025]  Intake/Output this shift:  No intake/output data recorded. Weight change: 1 kg DZH:GDJME, chronically ill-appearing WM CVS:RRR Resp: decreased BS at bases Abd: +BS, soft, NT/ND Ext: 2+ edema of left leg, s/p RAKA  Recent Labs  Lab 06/14/20 2332 06/15/20 1011 06/16/20 0257 06/17/20 0225 06/17/20 1506 06/17/20 1813 06/18/20 0247 06/18/20 1027  NA 123*  --  122* 122* 120* 120* 122* 121*  122*  K 3.7  --  4.1 4.2 4.2  --   --  4.0  CL 83*  --  82* 79* 80*  --   --  80*  CO2 35*  --  33* 35* 34*  --   --  35*  GLUCOSE 138*  --  77 81 116*  --   --  129*  BUN 16  --  16 14 15   --   --  51*  CREATININE 1.20  --  1.02 1.04 0.98  --   --  1.07  ALBUMIN  --  2.8*  --  2.9*  --   --   --  3.0*  CALCIUM 8.4*  --  8.6* 8.5* 8.1*  --   --  9.0  PHOS  --   --   --   --   --   --   --  2.0*  AST  --  22  --  21  --   --   --   --   ALT  --  16  --  15  --   --   --   --    Liver Function Tests: Recent Labs  Lab 06/15/20 1011 06/17/20 0225 06/18/20 1027  AST 22 21  --   ALT 16 15  --   ALKPHOS 51 55  --   BILITOT 0.7 0.7  --   PROT 4.8* 4.9*  --   ALBUMIN 2.8* 2.9* 3.0*   No results for input(s): LIPASE, AMYLASE in the last 168 hours. No results for input(s): AMMONIA in the last 168 hours. CBC: Recent Labs  Lab 06/14/20 2332  WBC 9.5  HGB 8.6*  HCT 26.5*  MCV 93.6  PLT 298   Cardiac  Enzymes: No results for input(s): CKTOTAL, CKMB, CKMBINDEX, TROPONINI in the last 168 hours. CBG: No results for input(s): GLUCAP in the last 168 hours.  Iron Studies: No results for input(s): IRON, TIBC, TRANSFERRIN, FERRITIN in the last 72 hours. Studies/Results: No results found.  ALPRAZolam  0.5 mg Oral QHS   aspirin EC  81 mg Oral Daily   atorvastatin  40 mg Oral QHS   budesonide-formoterol  2 puff Inhalation BID   cholecalciferol  2,000 Units Oral Q lunch   docusate sodium  100 mg Oral BID   enoxaparin (LOVENOX) injection  40 mg  Subcutaneous Q24H   ferrous sulfate  325 mg Oral Q breakfast   finasteride  5 mg Oral Q lunch   fluticasone  1-2 spray Each Nare Daily   Gerhardt's butt cream   Topical BID   guaiFENesin  600 mg Oral BID   levothyroxine  50 mcg Oral Q0600   metoprolol tartrate  12.5 mg Oral TID   multivitamin  1 tablet Oral Daily   mupirocin ointment   Topical Daily   pantoprazole  40 mg Oral Daily   polyethylene glycol  17 g Oral Daily   predniSONE  5 mg Oral Q breakfast   sodium chloride  1 drop Both Eyes QID   sodium chloride flush  3 mL Intravenous Q12H   Polyethyl Glycol-Propyl Glycol  1 drop Both Eyes TID   Tiotropium Bromide Monohydrate  2 puff Inhalation Q0600   urea  30 g Oral BID   vitamin B-12  1,000 mcg Oral Daily    BMET    Component Value Date/Time   NA 122 (L) 06/18/2020 1027   NA 121 (L) 06/18/2020 1027   NA 132 (L) 11/07/2019 1636   K 4.0 06/18/2020 1027   CL 80 (L) 06/18/2020 1027   CO2 35 (H) 06/18/2020 1027   GLUCOSE 129 (H) 06/18/2020 1027   BUN 51 (H) 06/18/2020 1027   BUN 23 11/07/2019 1636   CREATININE 1.07 06/18/2020 1027   CALCIUM 9.0 06/18/2020 1027   GFRNONAA >60 06/18/2020 1027   GFRAA 72 11/07/2019 1636   CBC    Component Value Date/Time   WBC 9.5 06/14/2020 2332   RBC 2.68 (L) 06/15/2020 1011   RBC 2.83 (L) 06/14/2020 2332   HGB 8.6 (L) 06/14/2020 2332   HGB 10.6 (L) 09/22/2019 1556   HCT 26.5 (L) 06/14/2020 2332    HCT 31.7 (L) 09/22/2019 1556   PLT 298 06/14/2020 2332   PLT 258 09/22/2019 1556   MCV 93.6 06/14/2020 2332   MCV 93 09/22/2019 1556   MCH 30.4 06/14/2020 2332   MCHC 32.5 06/14/2020 2332   RDW 13.9 06/14/2020 2332   RDW 12.0 09/22/2019 1556   LYMPHSABS 0.2 (L) 06/06/2020 0052   LYMPHSABS 0.4 (L) 06/25/2019 1449   MONOABS 0.4 06/06/2020 0052   EOSABS 0.0 06/06/2020 0052   EOSABS 0.0 06/25/2019 1449   BASOSABS 0.0 06/06/2020 0052   BASOSABS 0.1 06/25/2019 1449     Assessment/Plan:  Hyponatremia, hypervolemic - Urine Na < 10 so likely intravascularly depleted from 3rd spacing.  Lasix stopped due to worsening sodium levels.  Sodium improved slightly with ure-Na 30 mg bid and fluid restriction yesterday.  TSH was also over 40.Since he is having significant drainage and edema will also dose with IV albumin followed by IV lasix 40 mg x 1 today and follow serum sodium levels.  Difficult situation and may consider dosing with tolvaptan if lasix doesn't improve sodium and edema.  Acute on chronic CHF - as above will try albumin and IV lasix but may need tolvaptan Hypothyroidism - severely elevated TSH and agree with starting replacement therapy. Chronic foley h/o bladder cancer - cont with foley. Chronic respiratory failure secondary to COPD (Stage IV) on chronic oxygen. PAD s/p RAKA Sacral decubiti P. Atrial fib - per primary.  Donetta Potts, MD [employer] [phone]

## 2020-06-18 NOTE — Progress Notes (Signed)
Physical Therapy Treatment Patient Details Name: [name] MRN: [id number] DOB: [birth year] Today's Date: 06/18/2020    History of Present Illness Pt is an 87 y.o. male recently admitted (06/04/20) with NSTEMI and d/c to SNF, now readmitted 07/01/2020 as SNF does not accept pts on Trilogy breathing machine. Pt with chronic LLE and coccyx wounds. Other PMH includes CAD, HF, severe COPD on chronic prednisone therapy, chronic hypoxic respiratory failure (2L O2 baseline), PVD s/p R AKA, HTN, PAF, anemia, chronic foley, depression.   PT Comments    Pt requiring increased assist for bed mobility and sitting EOB for breakfast; requiring mod-maxA for trunk elevation and scooting hips to EOB. Pt more alert this morning compared to prior session, but still seems limited by fatigue. Pt requires repeated verbal cues to attend to task, pausing or closing eyes for long periods of time during mobility. [name] present and supportive. Provided HEP handout for seated/supine LE therex to complete (pt and wife familiar with these exercises). Session limited by pt wanting to eat breakfast and RN present for morning meds. Will continue to follow acutely.    Follow Up Recommendations  SNF;Supervision for mobility/OOB     Equipment Recommendations  None recommended by PT    Recommendations for Other Services       Precautions / Restrictions Precautions Precautions: Fall;Other (comment) Precaution Comments: Fragile skin with chronic LLE/sacral wounds; h/o R AKA; baseline 2L O2; chronic foley Required Braces or Orthoses: Other Brace Other Brace: L post-op shoe (pt prefers to wear for transfers/weight bearing) Restrictions Weight Bearing Restrictions: No    Mobility  Bed Mobility Overal bed mobility: Needs Assistance       Supine to sit: Mod assist;Max assist;HOB elevated     General bed mobility comments: Pt requiring increased assist for bed mobility this session, modA for trunk elevation coming to  long sitting (pt preference) and maxA to scoot R hip to EOB; pt requiring frequent, repeated verbal cues to attend to and complete task; pt assisting minimally with BUE support    Transfers                 General transfer comment:  (pt states, "I'm going to eat breakfast before I do anything else"; RN also present for morning medications)  Ambulation/Gait                 Stairs             Wheelchair Mobility    Modified Rankin (Stroke Patients Only)       Balance Overall balance assessment: Needs assistance Sitting-balance support: No upper extremity supported;Feet unsupported Sitting balance-Leahy Scale: Fair Sitting balance - Comments: Forward flexed posture, but able to maintain without UE support while eating breakfast; bed height elevated to allow for better reach to tray as pt with very forward flexed posture                                    Cognition Arousal/Alertness: Awake/alert Behavior During Therapy: Flat affect Overall Cognitive Status: Impaired/Different from baseline Area of Impairment: Attention;Following commands;Safety/judgement;Awareness;Problem solving                   Current Attention Level: Focused;Sustained   Following Commands: Follows one step commands with increased time Safety/Judgement: Decreased awareness of safety;Decreased awareness of deficits Awareness: Intellectual Problem Solving: Slow processing;Difficulty sequencing;Decreased initiation;Requires verbal cues General Comments: Appears more alert  this session, motivated to sit up and states, "I'm not doing anything else until I've had my breakfast." Still requires significant increased time to complete mobility task, requiring frequent verbal cues to attend to task as pt stares blankly or starts to close eyes during task      Exercises Other Exercises Other Exercises: Provided [facility] HEP handout for pt to complete with wife before laying  back down (LAQ, seated marching), as well as SLR to complete in supine/recliner; wife reports understanding    General Comments General comments (skin integrity, edema, etc.): [name] present and supportive. Noted improvements in bilatera hand swelling; wife reports pt with significant BUE weeping overnight      Pertinent Vitals/Pain Pain Assessment: Faces Faces Pain Scale: Hurts a little bit Pain Location: Generalized Pain Descriptors / Indicators: Discomfort;Tiring Pain Intervention(s): Monitored during session;Limited activity within patient's tolerance    Home Living                      Prior Function            PT Goals (current goals can now be found in the care plan section) Progress towards PT goals: Not progressing toward goals - comment (fatigue)    Frequency    Min 2X/week      PT Plan Current plan remains appropriate    Co-evaluation              AM-PAC PT "6 Clicks" Mobility   Outcome Measure  Help needed turning from your back to your side while in a flat bed without using bedrails?: A Lot Help needed moving from lying on your back to sitting on the side of a flat bed without using bedrails?: A Lot Help needed moving to and from a bed to a chair (including a wheelchair)?: A Lot Help needed standing up from a chair using your arms (e.g., wheelchair or bedside chair)?: Total Help needed to walk in hospital room?: Total Help needed climbing 3-5 steps with a railing? : Total 6 Click Score: 9    End of Session Equipment Utilized During Treatment: Oxygen Activity Tolerance: Patient limited by fatigue Patient left: in bed;with call bell/phone within reach;with bed alarm set;with nursing/sitter in room;with family/visitor present Nurse Communication: Mobility status PT Visit Diagnosis: Unsteadiness on feet (R26.81);Muscle weakness (generalized) (M62.81);Adult, failure to thrive (R62.7)     Time: 0850-0903 PT Time Calculation (min) (ACUTE  ONLY): 13 min  Charges:  $Therapeutic Activity: 8-22 mins                     [name], PT, DPT Acute Rehabilitation Services  Pager [phone] Office [place] 06/18/2020, 9:41 AM

## 2020-06-18 NOTE — Plan of Care (Signed)
  Problem: Education: Goal: Knowledge of General Education information will improve Description: Including pain rating scale, medication(s)/side effects and non-pharmacologic comfort measures Outcome: Progressing   Problem: Health Behavior/Discharge Planning: Goal: Ability to manage health-related needs will improve Outcome: Progressing   Problem: Clinical Measurements: Goal: Will remain free from infection Outcome: Progressing   Problem: Clinical Measurements: Goal: Diagnostic test results will improve Outcome: Progressing   

## 2020-06-18 NOTE — Progress Notes (Signed)
[facility] PROGRESS NOTE    Progress Note  [name]  NTZ:[id number] DOB: May 04, 1929 DOA: 06/13/2020 PCP: Travis Cruel, MD    Brief Narrative:   37 white male CAD + CABG Chronic respiratory failure 2/2 COPD (stage IV) on 2 L oxygen (wears trilogy at night). Paroxysmal A. fib CHADS2 score >4 not on AC 2/2 falls/risk of bleeding Bladder cancer + chronic Foley catheter  multiple sacral decubiti + left shin ulcer PAD status post R AKA  Recent hospitalization 5/28-->06/10/2020 with NSTEMI-cardiology recommended medical management given patient's chronic comorbidities  His wife is a retired [name] scientist and his son is Stage manager in [place]  His heart failure is improved however he became severely hyponatremic prompting nephrology consult and was found to have primary hypothyroidism and work-up He is currently awaiting hemodynamic stability and improvement of his sodium prior to transfer to LTAC   Assessment/Plan:   Acute on chronic diastolic heart failure Requiring oxygen IV Lasix was discontinued secondary to azotemia-likelihood that swelling secondary to poor albumin state Not ready currently for transition to hospice-disposition to LTAC when stabilized  Generalized weakness/ambulatory dysfunction: BMI 21 with moderate to severe malnutrition in the setting of hypoalbuminemia Secondary to generalized debility age and chronic illnesses Has multiple decubiti as documented below  Paroxysmal atrial fibrillation CHADS2 score >4 has bled score >3 secondary to risk of falls not on DOAC Rate controlled at this time Keep on monitors-continue metoprolol 12.5 3 times daily Check a.m. magnesium  Chronic iron deficiency anemia: Follow-up with PCP as an outpatient. Iron studies show strange findings-iron is 292 saturation ratios are 87?   They were low 1 week ago No action at this time-repeat labs in the outpatient setting  Acute on chronic respiratory failure with hypoxia Now  with his baseline of 2 L of oxygen in no acute distress.  We will start tapering his steroids From 10 mg to 5 mg   Acute superimposed on hypovolemic hyponatremia Admission 08/2019 was for severe hyponatremia Etiology probably hyporvolemic hyponatremia with T toast potomania--could also be hypothyroidism? Appreciate nephrology input Current management: Albumin X1 Lasix X1 continue urea packets Uric acid negative We will also give another dose of albumin 25 this afternoon given his rise in BUN  Primary hypothyroidism-new diagnosis this admission TSH is 40 --start Synthroid 50 mcg 6/10 and monitor for effect Will need TSH repeat in 2 weeks  Chronic indwelling Foley catheter: Noted next exchange on 06/21/2020  Multiple pressure ulcers of the sacrum and left shin present on admission no signs of infection: Wound care has been consulted RN Pressure Injury Documentation: Pressure Injury 01/24/20 Coccyx Mid;Lower Stage 2 -  Partial thickness loss of dermis presenting as a shallow open injury with a red, pink wound bed without slough. (Active)  01/24/20 1445  Location: Coccyx  Location Orientation: Mid;Lower  Staging: Stage 2 -  Partial thickness loss of dermis presenting as a shallow open injury with a red, pink wound bed without slough.  Wound Description (Comments):   Present on Admission: Yes     Pressure Injury 02/06/20 Buttocks Left Stage 2 -  Partial thickness loss of dermis presenting as a shallow open injury with a red, pink wound bed without slough. Above previous wound (Active)  02/06/20 1459  Location: Buttocks  Location Orientation: Left  Staging: Stage 2 -  Partial thickness loss of dermis presenting as a shallow open injury with a red, pink wound bed without slough.  Wound Description (Comments): Above previous wound  Present on Admission:  Yes     Pressure Injury 04/08/20 Buttocks Medial Stage 3 -  Full thickness tissue loss. Subcutaneous fat may be visible but bone, tendon  or muscle are NOT exposed. (Active)  04/08/20 1500  Location: Buttocks  Location Orientation: Medial  Staging: Stage 3 -  Full thickness tissue loss. Subcutaneous fat may be visible but bone, tendon or muscle are NOT exposed.  Wound Description (Comments):   Present on Admission: Yes     Pressure Injury 06/15/20 Nose Anterior;Medial Unstageable - Full thickness tissue loss in which the base of the injury is covered by slough (yellow, tan, gray, green or brown) and/or eschar (tan, brown or black) in the wound bed. full thickness injury fr (Active)  06/15/20 0900  Location: Nose  Location Orientation: Anterior;Medial  Staging: Unstageable - Full thickness tissue loss in which the base of the injury is covered by slough (yellow, tan, gray, green or brown) and/or eschar (tan, brown or black) in the wound bed.  Wound Description (Comments): full thickness injury from trilogy Bipap  Present on Admission: Yes    Estimated body mass index is 22.09 kg/m as calculated from the following:   Height as of this encounter: 5\' 8"  (1.727 m).   Weight as of this encounter: 65.9 kg.   DVT prophylaxis: SBD Family Communication: Discussed with the patient's wife  [name] area code [phone] daily since admission at the bedside  Status is: Inpatient  Remains inpatient appropriate because:Hemodynamically unstable   Dispo: The patient is from: SNF              Anticipated d/c is to: LTAC where he can get trilogy vent              Patient currently is not medically stable to d/c.   Difficult to place patient   Code Status:     Code Status Orders  (From admission, onward)           IV Access:   Peripheral IV  Procedures and diagnostic studies:   No results found.  Medical Consultants:   None.  Subjective:   Overall tired exhausted and asking when he can leave the hospital Does not feel any much better  Objective:    Vitals:   06/18/20 0348 06/18/20 0727 06/18/20  1224 06/18/20 1607  BP: (!) 93/49 (!) 90/53 (!) 130/51 (!) 113/54  Pulse: (!) 58 (!) 56 66 75  Resp: 15 19 19    Temp: 97.6 F (36.4 C) 97.9 F (36.6 C) 97.7 F (36.5 C)   TempSrc: Oral Oral Oral   SpO2: 98% 97% 100%   Weight: 65.9 kg     Height:       SpO2: 100 % O2 Flow Rate (L/min): 2 L/min   Intake/Output Summary (Last 24 hours) at 06/18/2020 1818 Last data filed at 06/18/2020 1342 Gross per 24 hour  Intake 976.42 ml  Output 700 ml  Net 276.42 ml    Filed Weights   06/16/20 0517 06/17/20 0008 06/18/20 0348  Weight: 65.8 kg 64.9 kg 65.9 kg    Exam:  Coherent no distress very hard of hearing weeping wounds bilaterally in arms Chest is clear however Abdomen is soft nontender no rebound No lower extremity some lower extremity edema is noted   Data Reviewed:    Labs: Basic Metabolic Panel: Recent Labs  Lab 06/14/20 2332 06/16/20 0257 06/17/20 0225 06/17/20 1506 06/17/20 1813 06/18/20 0247 06/18/20 1027  NA 123* 122* 122* 120* 120* 122* 121*  122*  K 3.7 4.1 4.2 4.2  --   --  4.0  CL 83* 82* 79* 80*  --   --  80*  CO2 35* 33* 35* 34*  --   --  35*  GLUCOSE 138* 77 81 116*  --   --  129*  BUN 16 16 14 15   --   --  51*  CREATININE 1.20 1.02 1.04 0.98  --   --  1.07  CALCIUM 8.4* 8.6* 8.5* 8.1*  --   --  9.0  PHOS  --   --   --   --   --   --  2.0*    GFR Estimated Creatinine Clearance: 44.5 mL/min (by C-G formula based on SCr of 1.07 mg/dL). Liver Function Tests: Recent Labs  Lab 06/15/20 1011 06/17/20 0225 06/18/20 1027  AST 22 21  --   ALT 16 15  --   ALKPHOS 51 55  --   BILITOT 0.7 0.7  --   PROT 4.8* 4.9*  --   ALBUMIN 2.8* 2.9* 3.0*    No results for input(s): LIPASE, AMYLASE in the last 168 hours. No results for input(s): AMMONIA in the last 168 hours. Coagulation profile No results for input(s): INR, PROTIME in the last 168 hours. COVID-19 Labs  No results for input(s): DDIMER, FERRITIN, LDH, CRP in the last 72 hours.   Lab  Results  Component Value Date   SARSCOV2NAA NEGATIVE 06/14/2020   SARSCOV2NAA NEGATIVE 06/09/2020   SARSCOV2NAA NEGATIVE 06/05/2020   [place] NEGATIVE 04/08/2020    CBC: Recent Labs  Lab 06/14/20 2332  WBC 9.5  HGB 8.6*  HCT 26.5*  MCV 93.6  PLT 298    Cardiac Enzymes: No results for input(s): CKTOTAL, CKMB, CKMBINDEX, TROPONINI in the last 168 hours. BNP (last 3 results) No results for input(s): PROBNP in the last 8760 hours. CBG: No results for input(s): GLUCAP in the last 168 hours. D-Dimer: No results for input(s): DDIMER in the last 72 hours. Hgb A1c: No results for input(s): HGBA1C in the last 72 hours. Lipid Profile: No results for input(s): CHOL, HDL, LDLCALC, TRIG, CHOLHDL, LDLDIRECT in the last 72 hours. Thyroid function studies: Recent Labs    06/17/20 1813  TSH 40.337*   Anemia work up: No results for input(s): VITAMINB12, FOLATE, FERRITIN, TIBC, IRON, RETICCTPCT in the last 72 hours.  Sepsis Labs: Recent Labs  Lab 06/14/20 2332  WBC 9.5    Microbiology Recent Results (from the past 240 hour(s))  SARS CORONAVIRUS 2 (TAT 6-24 HRS) Nasopharyngeal Nasopharyngeal Swab     Status: None   Collection Time: 06/09/20  4:48 PM   Specimen: Nasopharyngeal Swab  Result Value Ref Range Status   SARS Coronavirus 2 NEGATIVE NEGATIVE Final    Comment: (NOTE) SARS-CoV-2 target nucleic acids are NOT DETECTED.  The SARS-CoV-2 RNA is generally detectable in upper and lower respiratory specimens during the acute phase of infection. Negative results do not preclude SARS-CoV-2 infection, do not rule out co-infections with other pathogens, and should not be used as the sole basis for treatment or other patient management decisions. Negative results must be combined with clinical observations, patient history, and epidemiological information. The expected result is Negative.  Fact Sheet for Patients: SugarRoll.be  Fact Sheet for  Healthcare Providers: https://www.woods-mathews.com/  This test is not yet approved or cleared by the [place] FDA and  has been authorized for detection and/or diagnosis of SARS-CoV-2 by FDA under an Emergency Use Authorization (EUA). This EUA  will remain  in effect (meaning this test can be used) for the duration of the COVID-19 declaration under Se ction 564(b)(1) of the Act, 21 U.S.C. section 360bbb-3(b)(1), unless the authorization is terminated or revoked sooner.  Performed at [facility], [place] [address] [place], [place] [address]   SARS CORONAVIRUS 2 (TAT 6-24 HRS) Nasopharyngeal Nasopharyngeal Swab     Status: None   Collection Time: 06/14/20  9:50 PM   Specimen: Nasopharyngeal Swab  Result Value Ref Range Status   SARS Coronavirus 2 NEGATIVE NEGATIVE Final    Comment: (NOTE) SARS-CoV-2 target nucleic acids are NOT DETECTED.  The SARS-CoV-2 RNA is generally detectable in upper and lower respiratory specimens during the acute phase of infection. Negative results do not preclude SARS-CoV-2 infection, do not rule out co-infections with other pathogens, and should not be used as the sole basis for treatment or other patient management decisions. Negative results must be combined with clinical observations, patient history, and epidemiological information. The expected result is Negative.  Fact Sheet for Patients: SugarRoll.be  Fact Sheet for Healthcare Providers: https://www.woods-mathews.com/  This test is not yet approved or cleared by the [place] FDA and  has been authorized for detection and/or diagnosis of SARS-CoV-2 by FDA under an Emergency Use Authorization (EUA). This EUA will remain  in effect (meaning this test can be used) for the duration of the COVID-19 declaration under Se ction 564(b)(1) of the Act, 21 U.S.C. section 360bbb-3(b)(1), unless the authorization is terminated or revoked  sooner.  Performed at [facility], [place] [address] [place], [place] [address]      Medications:    ALPRAZolam  0.5 mg Oral QHS   aspirin EC  81 mg Oral Daily   atorvastatin  40 mg Oral QHS   budesonide-formoterol  2 puff Inhalation BID   cholecalciferol  2,000 Units Oral Q lunch   docusate sodium  100 mg Oral BID   enoxaparin (LOVENOX) injection  40 mg Subcutaneous Q24H   ferrous sulfate  325 mg Oral Q breakfast   finasteride  5 mg Oral Q lunch   fluticasone  1-2 spray Each Nare Daily   Gerhardt's butt cream   Topical BID   guaiFENesin  600 mg Oral BID   levothyroxine  50 mcg Oral Q0600   metoprolol tartrate  12.5 mg Oral TID   multivitamin  1 tablet Oral Daily   mupirocin ointment   Topical Daily   pantoprazole  40 mg Oral Daily   polyethylene glycol  17 g Oral Daily   predniSONE  5 mg Oral Q breakfast   sodium chloride  1 drop Both Eyes QID   sodium chloride flush  3 mL Intravenous Q12H   Polyethyl Glycol-Propyl Glycol  1 drop Both Eyes TID   Tiotropium Bromide Monohydrate  2 puff Inhalation Q0600   urea  30 g Oral BID   vitamin B-12  1,000 mcg Oral Daily   Continuous Infusions:  sodium chloride         LOS: 4 days   Jai-Gurmukh Alzora Ha  [facility]  06/18/2020, 6:18 PM

## 2020-06-18 NOTE — Care Management Important Message (Signed)
Important Message  Patient Details  Name: [name] MRN: [id number] Date of Birth: 1929-07-18   Medicare Important Message Given:  Yes     [name] 06/18/2020, 10:03 AM

## 2020-06-19 LAB — CBC
HCT: 21.6 % — ABNORMAL LOW (ref 39.0–52.0)
Hemoglobin: 7.1 g/dL — ABNORMAL LOW (ref 13.0–17.0)
MCH: 30.5 pg (ref 26.0–34.0)
MCHC: 32.9 g/dL (ref 30.0–36.0)
MCV: 92.7 fL (ref 80.0–100.0)
Platelets: 225 10*3/uL (ref 150–400)
RBC: 2.33 MIL/uL — ABNORMAL LOW (ref 4.22–5.81)
RDW: 14.2 % (ref 11.5–15.5)
WBC: 7.3 10*3/uL (ref 4.0–10.5)
nRBC: 0 % (ref 0.0–0.2)

## 2020-06-19 LAB — RENAL FUNCTION PANEL
Albumin: 3.2 g/dL — ABNORMAL LOW (ref 3.5–5.0)
Anion gap: 6 (ref 5–15)
BUN: 48 mg/dL — ABNORMAL HIGH (ref 8–23)
CO2: 37 mmol/L — ABNORMAL HIGH (ref 22–32)
Calcium: 9.2 mg/dL (ref 8.9–10.3)
Chloride: 84 mmol/L — ABNORMAL LOW (ref 98–111)
Creatinine, Ser: 1.03 mg/dL (ref 0.61–1.24)
GFR, Estimated: >60 mL/min (ref >60)
Glucose, Bld: 88 mg/dL (ref 70–99)
Phosphorus: 1.8 mg/dL — ABNORMAL LOW (ref 2.5–4.6)
Potassium: 3.7 mmol/L (ref 3.5–5.1)
Sodium: 127 mmol/L — ABNORMAL LOW (ref 135–145)

## 2020-06-19 LAB — T3: T3, Total: 34 ng/dL — ABNORMAL LOW (ref 71–180)

## 2020-06-19 LAB — SODIUM
Sodium: 126 mmol/L — ABNORMAL LOW (ref 135–145)
Sodium: 126 mmol/L — ABNORMAL LOW (ref 135–145)
Sodium: 123 mmol/L — ABNORMAL LOW (ref 135–145)
Sodium: 125 mmol/L — ABNORMAL LOW (ref 135–145)
Sodium: 124 mmol/L — ABNORMAL LOW (ref 135–145)

## 2020-06-19 MED ORDER — FUROSEMIDE 10 MG/ML IJ SOLN
40.0000 mg | Freq: Once | INTRAMUSCULAR | Status: AC
  Administered 2020-06-19: 40 mg via INTRAVENOUS
  Filled 2020-06-19: qty 4

## 2020-06-19 MED ORDER — ALBUMIN HUMAN 25 % IV SOLN
25.0000 g | Freq: Once | INTRAVENOUS | Status: AC
  Administered 2020-06-19: 25 g via INTRAVENOUS
  Filled 2020-06-19: qty 100

## 2020-06-19 MED ORDER — FLUOXETINE HCL 10 MG PO CAPS
10.0000 mg | ORAL_CAPSULE | Freq: Every day | ORAL | Status: DC
  Administered 2020-06-19 – 2020-06-20 (×2): 10 mg via ORAL
  Filled 2020-06-19 (×3): qty 1

## 2020-06-19 MED ORDER — CALCIUM CARBONATE ANTACID 500 MG PO CHEW
1.0000 | CHEWABLE_TABLET | Freq: Three times a day (TID) | ORAL | Status: DC
  Administered 2020-06-19 – 2020-06-21 (×7): 200 mg via ORAL
  Filled 2020-06-19 (×7): qty 1

## 2020-06-19 NOTE — Progress Notes (Signed)
Patient ID: [name], male   DOB: Apr 08, 1929, 87 y.o.   MRN: [id number] S: Feels very weak, wife at bedside reports he is very confused this morning. O:BP (!) 97/52 (BP Location: Right Arm)   Pulse 77   Temp 97.7 F (36.5 C) (Oral)   Resp 19   Ht 5\' 8"  (1.727 m)   Wt 66.9 kg   SpO2 98%   BMI 22.43 kg/m   Intake/Output Summary (Last 24 hours) at 06/19/2020 1213 Last data filed at 06/19/2020 1157 Gross per 24 hour  Intake 817 ml  Output 2778 ml  Net -1961 ml   Intake/Output: I/O last 3 completed shifts: In: 782 [P.O.:957] Out: 3128 [Urine:3128]  Intake/Output this shift:  Total I/O In: 100 [P.O.:100] Out: 350 [Urine:350] Weight change: 1 kg NFA:[id number] ill-appearing  CVS:RRR Resp:Decreased BS at bases, poor inspiratory effort Abd: +BS, soft, NT/ND Ext: 2+ anasarca of left leg and bilateral arms and presacral region.  S/p RAKA  Recent Labs  Lab 06/14/20 2332 06/15/20 1011 06/16/20 0257 06/17/20 0225 06/17/20 1506 06/17/20 1813 06/18/20 0247 06/18/20 1027 06/18/20 1819 06/18/20 2031 06/19/20 0249 06/19/20 0528 06/19/20 0917  NA 123*  --  122* 122* 120*   < > 122* 121*  122* 123* 124* 127* 126* 126*  K 3.7  --  4.1 4.2 4.2  --   --  4.0  --   --  3.7  --   --   CL 83*  --  82* 79* 80*  --   --  80*  --   --  84*  --   --   CO2 35*  --  33* 35* 34*  --   --  35*  --   --  37*  --   --   GLUCOSE 138*  --  77 81 116*  --   --  129*  --   --  88  --   --   BUN 16  --  16 14 15   --   --  51*  --   --  48*  --   --   CREATININE 1.20  --  1.02 1.04 0.98  --   --  1.07  --   --  1.03  --   --   ALBUMIN  --  2.8*  --  2.9*  --   --   --  3.0*  --   --  3.2*  --   --   CALCIUM 8.4*  --  8.6* 8.5* 8.1*  --   --  9.0  --   --  9.2  --   --   PHOS  --   --   --   --   --   --   --  2.0*  --   --  1.8*  --   --   AST  --  22  --  21  --   --   --   --   --   --   --   --   --   ALT  --  16  --  15  --   --   --   --   --   --   --   --   --    < > = values in this  interval not displayed.   Liver Function Tests: Recent Labs  Lab 06/15/20 1011 06/17/20 0225 06/18/20 1027 06/19/20 0249  AST 22 21  --   --  ALT 16 15  --   --   ALKPHOS 51 55  --   --   BILITOT 0.7 0.7  --   --   PROT 4.8* 4.9*  --   --   ALBUMIN 2.8* 2.9* 3.0* 3.2*   No results for input(s): LIPASE, AMYLASE in the last 168 hours. No results for input(s): AMMONIA in the last 168 hours. CBC: Recent Labs  Lab 06/14/20 2332 06/19/20 0249  WBC 9.5 7.3  HGB 8.6* 7.1*  HCT 26.5* 21.6*  MCV 93.6 92.7  PLT 298 225   Cardiac Enzymes: No results for input(s): CKTOTAL, CKMB, CKMBINDEX, TROPONINI in the last 168 hours. CBG: No results for input(s): GLUCAP in the last 168 hours.  Iron Studies: No results for input(s): IRON, TIBC, TRANSFERRIN, FERRITIN in the last 72 hours. Studies/Results: No results found.  ALPRAZolam  0.5 mg Oral QHS   aspirin EC  81 mg Oral Daily   atorvastatin  40 mg Oral QHS   budesonide-formoterol  2 puff Inhalation BID   cholecalciferol  2,000 Units Oral Q lunch   docusate sodium  100 mg Oral BID   enoxaparin (LOVENOX) injection  40 mg Subcutaneous Q24H   ferrous sulfate  325 mg Oral Q breakfast   finasteride  5 mg Oral Q lunch   fluticasone  1-2 spray Each Nare Daily   Gerhardt's butt cream   Topical BID   guaiFENesin  600 mg Oral BID   levothyroxine  50 mcg Oral Q0600   metoprolol tartrate  12.5 mg Oral TID   multivitamin  1 tablet Oral Daily   mupirocin ointment   Topical Daily   pantoprazole  40 mg Oral Daily   polyethylene glycol  17 g Oral Daily   predniSONE  5 mg Oral Q breakfast   sodium chloride  1 drop Both Eyes QID   sodium chloride flush  3 mL Intravenous Q12H   Polyethyl Glycol-Propyl Glycol  1 drop Both Eyes TID   Tiotropium Bromide Monohydrate  2 puff Inhalation Q0600   urea  30 g Oral BID   vitamin B-12  1,000 mcg Oral Daily    BMET    Component Value Date/Time   NA 126 (L) 06/19/2020 0917   NA 132 (L) 11/07/2019 1636    K 3.7 06/19/2020 0249   CL 84 (L) 06/19/2020 0249   CO2 37 (H) 06/19/2020 0249   GLUCOSE 88 06/19/2020 0249   BUN 48 (H) 06/19/2020 0249   BUN 23 11/07/2019 1636   CREATININE 1.03 06/19/2020 0249   CALCIUM 9.2 06/19/2020 0249   GFRNONAA >60 06/19/2020 0249   GFRAA 72 11/07/2019 1636   CBC    Component Value Date/Time   WBC 7.3 06/19/2020 0249   RBC 2.33 (L) 06/19/2020 0249   HGB 7.1 (L) 06/19/2020 0249   HGB 10.6 (L) 09/22/2019 1556   HCT 21.6 (L) 06/19/2020 0249   HCT 31.7 (L) 09/22/2019 1556   PLT 225 06/19/2020 0249   PLT 258 09/22/2019 1556   MCV 92.7 06/19/2020 0249   MCV 93 09/22/2019 1556   MCH 30.5 06/19/2020 0249   MCHC 32.9 06/19/2020 0249   RDW 14.2 06/19/2020 0249   RDW 12.0 09/22/2019 1556   LYMPHSABS 0.2 (L) 06/06/2020 0052   LYMPHSABS 0.4 (L) 06/25/2019 1449   MONOABS 0.4 06/06/2020 0052   EOSABS 0.0 06/06/2020 0052   EOSABS 0.0 06/25/2019 1449   BASOSABS 0.0 06/06/2020 0052   BASOSABS 0.1 06/25/2019 1449  Assessment/Plan:   Hyponatremia, hypervolemic - Urine Na < 10 so likely intravascularly depleted from 3rd spacing.  Lasix stopped due to worsening sodium levels.  Sodium improved slightly with ure-Na 30 mg bid and fluid restriction yesterday.  TSH was also over 40. S/p 12.5 grams IV albumin followed by IV lasix 40 mg x 1 yesterday with increased UOP and improved sodium levels Will dose again today and follow serum sodium levels.   Difficult situation and may consider dosing with tolvaptan if lasix doesn't continue to improve sodium and edema. Acute on chronic CHF - as above will try albumin and IV lasix but may need tolvaptan Hypothyroidism - severely elevated TSH and agree with starting replacement therapy. Chronic foley h/o bladder cancer - cont with foley. Chronic respiratory failure secondary to COPD (Stage IV) on chronic oxygen. PAD s/p RAKA Sacral decubiti P. Atrial fib - per primary.  Donetta Potts, MD [employer] [phone]

## 2020-06-19 NOTE — Plan of Care (Signed)
  Problem: Safety: Goal: Ability to remain free from injury will improve Outcome: Progressing   

## 2020-06-19 NOTE — Progress Notes (Signed)
[facility] PROGRESS NOTE    Progress Note  [name]  URK:[id number] DOB: 1929-10-27 DOA: 06/10/2020 PCP: [name], MD    Brief Narrative:   72 white male CAD + CABG Chronic respiratory failure 2/2 COPD (stage IV) on 2 L oxygen (wears trilogy at night). Paroxysmal A. fib CHADS2 score >4 not on AC 2/2 falls/risk of bleeding Bladder cancer + chronic Foley catheter  multiple sacral decubiti + left shin ulcer PAD status post R AKA  Recent hospitalization 5/28-->06/10/2020 with NSTEMI-cardiology recommended medical management given patient's chronic comorbidities  His wife is a retired Marine scientist and his son is Stage manager in [place]  His heart failure is improved however he became severely hyponatremic prompting nephrology consult and was found to have primary hypothyroidism on work-up He is currently awaiting hemodynamic stability and improvement of his sodium prior to transfer to LTAC   Assessment/Plan:   Acute on chronic diastolic heart failure Requiring oxygen Not ready currently for transition to hospice-disposition to LTAC when stabilized  Generalized weakness/ambulatory dysfunction: BMI 21 with moderate to severe malnutrition in the setting of hypoalbuminemia Secondary to generalized debility age and chronic illnesses Has multiple decubiti as documented below  Paroxysmal atrial fibrillation CHADS2 score >4 has bled score >3 secondary to risk of falls not on DOAC Rate controlled at this time Keep on monitors-continue metoprolol 12.5 3 times daily Check a.m. magnesium  Chronic iron deficiency anemia: Follow-up with PCP as an outpatient. Iron studies show strange findings-iron is 292 saturation ratios are 87?   They were low 1 week ago No action at this time-repeat labs in the outpatient setting  Acute on chronic respiratory failure with hypoxia Now with his baseline of 2 L of oxygen in no acute distress.  We will start tapering his steroids From 10 mg  to 5 mg   Acute superimposed on hypovolemic hyponatremia Admission 08/2019 was for severe hyponatremia Etiology probably hyporvolemic hyponatremia with T toast potomania--could also be hypothyroidism? Appreciate nephrology input Current management: Lasix 1 dose given 6/11, albumin 25, urea 30 twice daily  Primary hypothyroidism-new diagnosis this admission TSH is 40 --start Synthroid 50 mcg 6/10 and monitor for effect Will need TSH repeat in 2 weeks  Chronic indwelling Foley catheter: Noted next exchange on 06/21/2020  Multiple pressure ulcers of the sacrum and left shin present on admission no signs of infection: Wound care has been consulted RN Pressure Injury Documentation: Pressure Injury 01/24/20 Coccyx Mid;Lower Stage 2 -  Partial thickness loss of dermis presenting as a shallow open injury with a red, pink wound bed without slough. (Active)  01/24/20 1445  Location: Coccyx  Location Orientation: Mid;Lower  Staging: Stage 2 -  Partial thickness loss of dermis presenting as a shallow open injury with a red, pink wound bed without slough.  Wound Description (Comments):   Present on Admission: Yes     Pressure Injury 02/06/20 Buttocks Left Stage 2 -  Partial thickness loss of dermis presenting as a shallow open injury with a red, pink wound bed without slough. Above previous wound (Active)  02/06/20 1459  Location: Buttocks  Location Orientation: Left  Staging: Stage 2 -  Partial thickness loss of dermis presenting as a shallow open injury with a red, pink wound bed without slough.  Wound Description (Comments): Above previous wound  Present on Admission: Yes     Pressure Injury 04/08/20 Buttocks Medial Stage 3 -  Full thickness tissue loss. Subcutaneous fat may be visible but bone, tendon or muscle are  NOT exposed. (Active)  04/08/20 1500  Location: Buttocks  Location Orientation: Medial  Staging: Stage 3 -  Full thickness tissue loss. Subcutaneous fat may be visible but bone,  tendon or muscle are NOT exposed.  Wound Description (Comments):   Present on Admission: Yes     Pressure Injury 06/15/20 Nose Anterior;Medial Unstageable - Full thickness tissue loss in which the base of the injury is covered by slough (yellow, tan, gray, green or brown) and/or eschar (tan, brown or black) in the wound bed. full thickness injury fr (Active)  06/15/20 0900  Location: Nose  Location Orientation: Anterior;Medial  Staging: Unstageable - Full thickness tissue loss in which the base of the injury is covered by slough (yellow, tan, gray, green or brown) and/or eschar (tan, brown or black) in the wound bed.  Wound Description (Comments): full thickness injury from trilogy Bipap  Present on Admission: Yes    Estimated body mass index is 22.43 kg/m as calculated from the following:   Height as of this encounter: 5\' 8"  (1.727 m).   Weight as of this encounter: 66.9 kg.   DVT prophylaxis: SBD Family Communication: Discussed with the patient's wife  [name] area code [id number] daily since admission at the bedside  Status is: Inpatient  Remains inpatient appropriate because:Hemodynamically unstable   Dispo: The patient is from: SNF              Anticipated d/c is to: LTAC where he can get trilogy vent              Patient currently is not medically stable to d/c.   Difficult to place patient   Code Status:     Code Status Orders  (From admission, onward)           IV Access:   Peripheral IV  Procedures and diagnostic studies:   No results found.  Medical Consultants:   None.  Subjective:   Complains of being weak and having some abdominal pain Also some reports of him being lethargic however he is holding a clear conversation with me this afternoon Also asking about 3 Tums at a time and his Prozac which she feels was helping for his depression I addressed these concerns in addition to others with his wife at the bedside  Objective:     Vitals:   06/19/20 0534 06/19/20 0728 06/19/20 1155 06/19/20 1628  BP: 100/61 (!) 95/57 (!) 97/52 (!) 101/59  Pulse: 75 74 77 84  Resp:  17 19 17   Temp: 98 F (36.7 C) 97.7 F (36.5 C) 97.7 F (36.5 C) 98.5 F (36.9 C)  TempSrc: Oral Oral Oral Oral  SpO2: 98% 97% 98% 100%  Weight: 66.9 kg     Height:       SpO2: 100 % O2 Flow Rate (L/min): 2 L/min   Intake/Output Summary (Last 24 hours) at 06/19/2020 1648 Last data filed at 06/19/2020 1407 Gross per 24 hour  Intake 820 ml  Output 2778 ml  Net -1958 ml    Filed Weights   06/17/20 0008 06/18/20 0348 06/19/20 0534  Weight: 64.9 kg 65.9 kg 66.9 kg    Exam:  Awake frail appearing white male no distress on oxygen Anasarca in arms with significant swelling and some weeping and breakdown as well Abdomen is soft no rebound no guarding Some lower extremity edema left leg stump appears clean Neurologically is intact no focal deficits although is quite debilitated Psych he is flat affect   Data Reviewed:  Labs: Basic Metabolic Panel: Recent Labs  Lab 06/16/20 0257 06/17/20 0225 06/17/20 1506 06/17/20 1813 06/18/20 1027 06/18/20 1819 06/18/20 2031 06/19/20 0249 06/19/20 0528 06/19/20 0917 06/19/20 1312  NA 122* 122* 120*   < > 121*  122*   < > 124* 127* 126* 126* 123*  K 4.1 4.2 4.2  --  4.0  --   --  3.7  --   --   --   CL 82* 79* 80*  --  80*  --   --  84*  --   --   --   CO2 33* 35* 34*  --  35*  --   --  37*  --   --   --   GLUCOSE 77 81 116*  --  129*  --   --  88  --   --   --   BUN 16 14 15   --  51*  --   --  48*  --   --   --   CREATININE 1.02 1.04 0.98  --  1.07  --   --  1.03  --   --   --   CALCIUM 8.6* 8.5* 8.1*  --  9.0  --   --  9.2  --   --   --   PHOS  --   --   --   --  2.0*  --   --  1.8*  --   --   --    < > = values in this interval not displayed.    GFR Estimated Creatinine Clearance: 46.9 mL/min (by C-G formula based on SCr of 1.03 mg/dL). Liver Function Tests: Recent Labs   Lab 06/15/20 1011 06/17/20 0225 06/18/20 1027 06/19/20 0249  AST 22 21  --   --   ALT 16 15  --   --   ALKPHOS 51 55  --   --   BILITOT 0.7 0.7  --   --   PROT 4.8* 4.9*  --   --   ALBUMIN 2.8* 2.9* 3.0* 3.2*    No results for input(s): LIPASE, AMYLASE in the last 168 hours. No results for input(s): AMMONIA in the last 168 hours. Coagulation profile No results for input(s): INR, PROTIME in the last 168 hours. COVID-19 Labs  No results for input(s): DDIMER, FERRITIN, LDH, CRP in the last 72 hours.   Lab Results  Component Value Date   SARSCOV2NAA NEGATIVE 06/14/2020   SARSCOV2NAA NEGATIVE 06/09/2020   SARSCOV2NAA NEGATIVE 06/05/2020   [place] NEGATIVE 04/08/2020    CBC: Recent Labs  Lab 06/14/20 2332 06/19/20 0249  WBC 9.5 7.3  HGB 8.6* 7.1*  HCT 26.5* 21.6*  MCV 93.6 92.7  PLT 298 225    Cardiac Enzymes: No results for input(s): CKTOTAL, CKMB, CKMBINDEX, TROPONINI in the last 168 hours. BNP (last 3 results) No results for input(s): PROBNP in the last 8760 hours. CBG: No results for input(s): GLUCAP in the last 168 hours. D-Dimer: No results for input(s): DDIMER in the last 72 hours. Hgb A1c: No results for input(s): HGBA1C in the last 72 hours. Lipid Profile: No results for input(s): CHOL, HDL, LDLCALC, TRIG, CHOLHDL, LDLDIRECT in the last 72 hours. Thyroid function studies: Recent Labs    06/17/20 1813  TSH 40.337*    Anemia work up: No results for input(s): VITAMINB12, FOLATE, FERRITIN, TIBC, IRON, RETICCTPCT in the last 72 hours.  Sepsis Labs: Recent Labs  Lab 06/14/20 2332 06/19/20 0249  WBC 9.5 7.3    Microbiology Recent Results (from the past 240 hour(s))  SARS CORONAVIRUS 2 (TAT 6-24 HRS) Nasopharyngeal Nasopharyngeal Swab     Status: None   Collection Time: 06/14/20  9:50 PM   Specimen: Nasopharyngeal Swab  Result Value Ref Range Status   SARS Coronavirus 2 NEGATIVE NEGATIVE Final    Comment: (NOTE) SARS-CoV-2 target  nucleic acids are NOT DETECTED.  The SARS-CoV-2 RNA is generally detectable in upper and lower respiratory specimens during the acute phase of infection. Negative results do not preclude SARS-CoV-2 infection, do not rule out co-infections with other pathogens, and should not be used as the sole basis for treatment or other patient management decisions. Negative results must be combined with clinical observations, patient history, and epidemiological information. The expected result is Negative.  Fact Sheet for Patients: SugarRoll.be  Fact Sheet for Healthcare Providers: https://www.woods-mathews.com/  This test is not yet approved or cleared by the [place] FDA and  has been authorized for detection and/or diagnosis of SARS-CoV-2 by FDA under an Emergency Use Authorization (EUA). This EUA will remain  in effect (meaning this test can be used) for the duration of the COVID-19 declaration under Se ction 564(b)(1) of the Act, 21 U.S.C. section 360bbb-3(b)(1), unless the authorization is terminated or revoked sooner.  Performed at [facility], [place] [address], [place], [place] [address]      Medications:    ALPRAZolam  0.5 mg Oral QHS   aspirin EC  81 mg Oral Daily   atorvastatin  40 mg Oral QHS   budesonide-formoterol  2 puff Inhalation BID   calcium carbonate  1 tablet Oral TID   cholecalciferol  2,000 Units Oral Q lunch   docusate sodium  100 mg Oral BID   enoxaparin (LOVENOX) injection  40 mg Subcutaneous Q24H   ferrous sulfate  325 mg Oral Q breakfast   finasteride  5 mg Oral Q lunch   FLUoxetine  10 mg Oral Daily   fluticasone  1-2 spray Each Nare Daily   Gerhardt's butt cream   Topical BID   guaiFENesin  600 mg Oral BID   levothyroxine  50 mcg Oral Q0600   metoprolol tartrate  12.5 mg Oral TID   multivitamin  1 tablet Oral Daily   mupirocin ointment   Topical Daily   pantoprazole  40 mg Oral Daily   polyethylene  glycol  17 g Oral Daily   predniSONE  5 mg Oral Q breakfast   sodium chloride  1 drop Both Eyes QID   sodium chloride flush  3 mL Intravenous Q12H   Polyethyl Glycol-Propyl Glycol  1 drop Both Eyes TID   Tiotropium Bromide Monohydrate  2 puff Inhalation Q0600   urea  30 g Oral BID   vitamin B-12  1,000 mcg Oral Daily   Continuous Infusions:  sodium chloride         LOS: 5 days   [name]  [facility]  06/19/2020, 4:48 PM

## 2020-06-19 NOTE — Plan of Care (Signed)

## 2020-06-20 DIAGNOSIS — I5033 Acute on chronic diastolic (congestive) heart failure: Secondary | ICD-10-CM

## 2020-06-20 LAB — CBC WITH DIFFERENTIAL/PLATELET
Abs Immature Granulocytes: 0.11 10*3/uL — ABNORMAL HIGH (ref 0.00–0.07)
Basophils Absolute: 0 10*3/uL (ref 0.0–0.1)
Basophils Relative: 0 %
Eosinophils Absolute: 0.1 10*3/uL (ref 0.0–0.5)
Eosinophils Relative: 1 %
HCT: 21.9 % — ABNORMAL LOW (ref 39.0–52.0)
Hemoglobin: 7 g/dL — ABNORMAL LOW (ref 13.0–17.0)
Immature Granulocytes: 1 %
Lymphocytes Relative: 5 %
Lymphs Abs: 0.5 10*3/uL — ABNORMAL LOW (ref 0.7–4.0)
MCH: 30.4 pg (ref 26.0–34.0)
MCHC: 32 g/dL (ref 30.0–36.0)
MCV: 95.2 fL (ref 80.0–100.0)
Monocytes Absolute: 1.1 10*3/uL — ABNORMAL HIGH (ref 0.1–1.0)
Monocytes Relative: 9 %
Neutro Abs: 9.6 10*3/uL — ABNORMAL HIGH (ref 1.7–7.7)
Neutrophils Relative %: 84 %
Platelets: 228 10*3/uL (ref 150–400)
RBC: 2.3 MIL/uL — ABNORMAL LOW (ref 4.22–5.81)
RDW: 14.6 % (ref 11.5–15.5)
WBC: 11.4 10*3/uL — ABNORMAL HIGH (ref 4.0–10.5)
nRBC: 0 % (ref 0.0–0.2)

## 2020-06-20 LAB — RENAL FUNCTION PANEL
Albumin: 3.5 g/dL (ref 3.5–5.0)
Anion gap: 9 (ref 5–15)
BUN: 88 mg/dL — ABNORMAL HIGH (ref 8–23)
CO2: 35 mmol/L — ABNORMAL HIGH (ref 22–32)
Calcium: 9.5 mg/dL (ref 8.9–10.3)
Chloride: 81 mmol/L — ABNORMAL LOW (ref 98–111)
Creatinine, Ser: 1.1 mg/dL (ref 0.61–1.24)
GFR, Estimated: >60 mL/min (ref >60)
Glucose, Bld: 91 mg/dL (ref 70–99)
Phosphorus: 2.1 mg/dL — ABNORMAL LOW (ref 2.5–4.6)
Potassium: 3.7 mmol/L (ref 3.5–5.1)
Sodium: 125 mmol/L — ABNORMAL LOW (ref 135–145)

## 2020-06-20 LAB — SODIUM
Sodium: 127 mmol/L — ABNORMAL LOW (ref 135–145)
Sodium: 127 mmol/L — ABNORMAL LOW (ref 135–145)
Sodium: 126 mmol/L — ABNORMAL LOW (ref 135–145)
Sodium: 125 mmol/L — ABNORMAL LOW (ref 135–145)

## 2020-06-20 LAB — MAGNESIUM: Magnesium: 2.1 mg/dL (ref 1.7–2.4)

## 2020-06-20 MED ORDER — SODIUM CHLORIDE 0.9% IV SOLUTION: Freq: Once | INTRAVENOUS | Status: DC

## 2020-06-20 MED ORDER — BISOPROLOL FUMARATE 5 MG PO TABS
2.5000 mg | ORAL_TABLET | Freq: Every day | ORAL | Status: DC
  Administered 2020-06-20: 2.5 mg via ORAL
  Filled 2020-06-20: qty 1

## 2020-06-20 MED ORDER — DIPHENHYDRAMINE HCL 25 MG PO CAPS: 25.0000 mg | ORAL_CAPSULE | Freq: Once | ORAL | Status: DC

## 2020-06-20 MED ORDER — FUROSEMIDE 10 MG/ML IJ SOLN
20.0000 mg | Freq: Once | INTRAMUSCULAR | Status: AC
  Administered 2020-06-21: 20 mg via INTRAVENOUS
  Filled 2020-06-20: qty 2

## 2020-06-20 NOTE — Progress Notes (Signed)
   Palliative Medicine Inpatient Follow Up Note   PMT has been asked to see [name] to further facilitate end of life conversations.   I have called [name]'s wife, [name] and we plan to meet tomorrow at 9:30AM.  No Charge ______________________________________________________________________________________ [place] Team Team Cell Phone: [phone] Please utilize secure chat with additional questions, if there is no response within 30 minutes please call the above phone number  Palliative Medicine Team providers are available by phone from 7am to 7pm daily and can be reached through the team cell phone.  Should this patient require assistance outside of these hours, please call the patient's attending physician.

## 2020-06-20 NOTE — Consult Note (Addendum)
Palliative Medicine Inpatient Consult Note  Reason for consult:  "End-of-life hospice discussions"  HPI:  Per intake H&P -->  [name] is an 30 white male with a PMHx: CAD + CABG, chronic respiratory failure 2/2 COPD (stage IV) on 2 L oxygen (wears trilogy at night), Paroxysmal A. fib CHADS2 score >4 not on AC 2/2 falls/risk of bleeding, bladder cancer + chronic Foley catheter, multiple sacral decubiti + left shin ulcer, & PAD status post R AKA. He has been hospitalized for the past nine days in the setting of diastolic heart failure. Palliative care has been asked to get involved to further discuss end of life options.  Of note PMT saw [name] at the end of May at which time we had agreed for him to proceed with a trial of SNF and then to home with OP Palliative support through [facility].Unfortunately, he was only at skilled nursing for one day prior to rehospitalization.   Clinical Assessment/Goals of Care:  *Please note that this is a verbal dictation therefore any spelling or grammatical errors are due to the "[employer]" system interpretation.  I have reviewed medical records including EPIC notes, labs and imaging, received report from bedside RN, assessed the patient who was lying in his bed on trilogy, he was alert and aware of himself and where he was. Patient complains of dyspnea and chest pain.    I met with [name] (wife) and [name] (daughter)to further discuss diagnosis prognosis, GOC, EOL wishes, disposition and options.   I introduced Palliative Medicine as specialized medical care for people living with serious illness. It focuses on providing relief from the symptoms and stress of a serious illness. The goal is to improve quality of life for both the patient and the family.  A brief life review was completed: [name] shares with me that he is from [place], [place].  He has been married to his wife [name] for the past 99 years they share 2 children  and 3 grandchildren.  [name] used to work for [employer] and shares with me that he has great retirement benefits as a result of having worked for them.  Enjoys spending time with his family.  He is a man of faith and practices within the [employer].  Prior to previous admission [name] was able to assist with transfers and to feed himself.  His wife, [name] assisted with all other activities of daily living.  I reviewed with [name] multiple comorbid conditions inclusive of his coronary artery disease, severe COPD, heart failure, and peripheral vascular disease requiring a right above-the-knee amputation.  We discussed that at this point his body has declared what it is and is unable to sustain. I shared that we should think about next steps in terms of either continuing aggressive care which I don't believe will result in fruitful outcomes or further considering a comfort level of care.  Patients decision maker is his wife [name]. We reviewed [name]'s prior MOST and discussed updating this as below:   Cardiopulmonary Resuscitation: Do Not Attempt Resuscitation (DNR/No CPR)  Medical Interventions: Limited Additional Interventions: Use medical treatment, IV fluids and cardiac monitoring as indicated, DO NOT USE intubation or mechanical ventilation. May consider use of less invasive airway support such as BiPAP or CPAP. Also provide comfort measures. Transfer to the hospital if indicated. Avoid intensive care.   Antibiotics: No antibiotics (use other measures to relieve symptoms)  IV Fluids: No IV fluids (provide other measures to  ensure comfort)  Feeding Tube: No feeding tube   Reviewed the difference between in patient hospice and home hospice. [name] shares that she is not able to care for [name] at this time. Reviewed that in order to qualify for hospice we have to further determine if our goals are geared towards comfort. We talked about transition to  comfort measures in house and what that would entail inclusive of medications to control pain, dyspnea, agitation, nausea, itching, and hiccups.  We discussed stopping all uneccessary measures such as blood draws, needle sticks, and frequent vital signs. [name] shares that wants to first hear the recommendations from the primary MD and give [name] a day or two to see if he can be "stabilized". I asked what this meant to her and she shared that labs are normal and patient is not longer in acute phases of illness. I shared that I worry we may not be in that position of complete medical stability. This is why we are discussing topics such as aggressive symptom management and comfort care.   [name] expresses a lot of ambivalence regarding certain comfort medications. Try to clarify some of the misconceptions and hone in on the reality that the goal of these medications are to alleviate troubling symptoms.  [name] expresses then some feelings of aggravation regarding the PMT and how she has been trying to get in touch with [place] for the past nine days. I shared that I was unaware of this though I would follow up to further gain clarify.  Goals for this time are to continue current treatments for 24-48 hours and if no improvements to transition to comfort oriented care.   Discussed the importance of continued conversation with family and their  medical providers regarding overall plan of care and treatment options, ensuring decisions are within the context of the patients values and GOCs.  Decision Maker: [name] [phone]  SUMMARY OF RECOMMENDATIONS   DNAR/DNI  Continue Trilogy for comfort  Time trial of current therapies 24-48 hours to see if patients condition can be optimized. Have been open and honest with patients family that we are encroaching end of life. Expressed that it is important to consider where [name] would be made the most comfortable. Reviewed options such as in  home hospice and inpatient hospice.   Have asked [name] of [facility] to reach out to [name] for further conversations  Appreciate TOC making IP Hospice referrals  Ongoing support  Code Status/Advance Care Planning: DNAR/DNI   Palliative Prophylaxis:  Oral Care, Mobility, Pain  Additional Recommendations (Limitations, Scope, Preferences): For now continue to treat the treatable   Psycho-social/Spiritual:  Desire for further Chaplaincy support: No Additional Recommendations: Education on chronic disease processes   Prognosis: Poor limited days to weeks in the setting of acute respiratory failure. Hospice is appropriate.   Discharge Planning: Unclear   Vitals:   06/20/20 1115 06/20/20 1611  BP: 102/65 (!) 91/59  Pulse: (!) 105 78  Resp:    Temp:  98.4 F (36.9 C)  SpO2:  99%    Intake/Output Summary (Last 24 hours) at 06/20/2020 1714 Last data filed at 06/20/2020 1447 Gross per 24 hour  Intake 923 ml  Output 2500 ml  Net -1577 ml   Last Weight  Most recent update: 06/20/2020  2:05 AM    Weight  63.7 kg (140 lb 6.9 oz)            Gen:  Elderly ill appearing M in moderate distress HEENT: Dry  mucous membranes CV: Irregular rate and rhythm  PULM: On trilogy ABD: soft/nontender  EXT: (+) R AKA, trace pedal edema in LLE Neuro: Alert and oriented x2  PPS: 20%   This conversation/these recommendations were discussed with patient primary care team, Dr. Verlon Au  Time In: 0930 Time Out: 1106 Total Time: 96 Greater than 50%  of this time was spent counseling and coordinating care related to the above assessment and plan.  [facility] Team Team Cell Phone: [phone] Please utilize secure chat with additional questions, if there is no response within 30 minutes please call the above phone number  Palliative Medicine Team providers are available by phone from 7am to 7pm daily and can be reached through the team cell  phone.  Should this patient require assistance outside of these hours, please call the patient's attending physician.

## 2020-06-20 NOTE — Progress Notes (Signed)
Patient ID: [name], male   DOB: 1929-01-18, 87 y.o.   MRN: [id number] S: Feels a little better today. O:BP (!) 99/54 (BP Location: Right Arm)   Pulse 79   Temp 97.8 F (36.6 C) (Oral)   Resp 18   Ht 5\' 8"  (1.727 m)   Wt 63.7 kg   SpO2 98%   BMI 21.35 kg/m   Intake/Output Summary (Last 24 hours) at 06/20/2020 1034 Last data filed at 06/20/2020 6812 Gross per 24 hour  Intake 923 ml  Output 1550 ml  Net -627 ml   Intake/Output: I/O last 3 completed shifts: In: 1263 [P.O.:1260; I.V.:3] Out: 2150 [Urine:2150]  Intake/Output this shift:  No intake/output data recorded. Weight change: -3.2 kg Gen: Frail, chronically ill-appearing CVS:RRR Resp:decreased inspiratory effort, otherwise CTA Abd:+BS, soft, NT Ext: 1+ edema of LLE and upper extremities, s/p RAKA  Recent Labs  Lab 06/14/20 2332 06/15/20 1011 06/16/20 0257 06/17/20 0225 06/17/20 1506 06/17/20 1813 06/18/20 1027 06/18/20 1819 06/19/20 0249 06/19/20 0528 06/19/20 0917 06/19/20 1312 06/19/20 1726 06/19/20 2132 06/20/20 0205 06/20/20 0606  NA 123*  --  122* 122* 120*   < > 121*  122*   < > 127* 126* 126* 123* 125* 124* 125* 127*  K 3.7  --  4.1 4.2 4.2  --  4.0  --  3.7  --   --   --   --   --  3.7  --   CL 83*  --  82* 79* 80*  --  80*  --  84*  --   --   --   --   --  81*  --   CO2 35*  --  33* 35* 34*  --  35*  --  37*  --   --   --   --   --  35*  --   GLUCOSE 138*  --  77 81 116*  --  129*  --  88  --   --   --   --   --  91  --   BUN 16  --  16 14 15   --  51*  --  48*  --   --   --   --   --  88*  --   CREATININE 1.20  --  1.02 1.04 0.98  --  1.07  --  1.03  --   --   --   --   --  1.10  --   ALBUMIN  --  2.8*  --  2.9*  --   --  3.0*  --  3.2*  --   --   --   --   --  3.5  --   CALCIUM 8.4*  --  8.6* 8.5* 8.1*  --  9.0  --  9.2  --   --   --   --   --  9.5  --   PHOS  --   --   --   --   --   --  2.0*  --  1.8*  --   --   --   --   --  2.1*  --   AST  --  22  --  21  --   --   --   --   --   --   --    --   --   --   --   --   ALT  --  16  --  15  --   --   --   --   --   --   --   --   --   --   --   --    < > = values in this interval not displayed.   Liver Function Tests: Recent Labs  Lab 06/15/20 1011 06/17/20 0225 06/18/20 1027 06/19/20 0249 06/20/20 0205  AST 22 21  --   --   --   ALT 16 15  --   --   --   ALKPHOS 51 55  --   --   --   BILITOT 0.7 0.7  --   --   --   PROT 4.8* 4.9*  --   --   --   ALBUMIN 2.8* 2.9* 3.0* 3.2* 3.5   No results for input(s): LIPASE, AMYLASE in the last 168 hours. No results for input(s): AMMONIA in the last 168 hours. CBC: Recent Labs  Lab 06/14/20 2332 06/19/20 0249 06/20/20 0606  WBC 9.5 7.3 11.4*  NEUTROABS  --   --  9.6*  HGB 8.6* 7.1* 7.0*  HCT 26.5* 21.6* 21.9*  MCV 93.6 92.7 95.2  PLT 298 225 228   Cardiac Enzymes: No results for input(s): CKTOTAL, CKMB, CKMBINDEX, TROPONINI in the last 168 hours. CBG: No results for input(s): GLUCAP in the last 168 hours.  Iron Studies: No results for input(s): IRON, TIBC, TRANSFERRIN, FERRITIN in the last 72 hours. Studies/Results: No results found.  ALPRAZolam  0.5 mg Oral QHS   aspirin EC  81 mg Oral Daily   atorvastatin  40 mg Oral QHS   budesonide-formoterol  2 puff Inhalation BID   calcium carbonate  1 tablet Oral TID   cholecalciferol  2,000 Units Oral Q lunch   docusate sodium  100 mg Oral BID   enoxaparin (LOVENOX) injection  40 mg Subcutaneous Q24H   ferrous sulfate  325 mg Oral Q breakfast   finasteride  5 mg Oral Q lunch   FLUoxetine  10 mg Oral Daily   fluticasone  1-2 spray Each Nare Daily   Gerhardt's butt cream   Topical BID   guaiFENesin  600 mg Oral BID   levothyroxine  50 mcg Oral Q0600   multivitamin  1 tablet Oral Daily   mupirocin ointment   Topical Daily   pantoprazole  40 mg Oral Daily   polyethylene glycol  17 g Oral Daily   predniSONE  5 mg Oral Q breakfast   sodium chloride  1 drop Both Eyes QID   sodium chloride flush  3 mL Intravenous Q12H    Polyethyl Glycol-Propyl Glycol  1 drop Both Eyes TID   Tiotropium Bromide Monohydrate  2 puff Inhalation Q0600   urea  30 g Oral BID   vitamin B-12  1,000 mcg Oral Daily    BMET    Component Value Date/Time   NA 127 (L) 06/20/2020 0606   NA 132 (L) 11/07/2019 1636   K 3.7 06/20/2020 0205   CL 81 (L) 06/20/2020 0205   CO2 35 (H) 06/20/2020 0205   GLUCOSE 91 06/20/2020 0205   BUN 88 (H) 06/20/2020 0205   BUN 23 11/07/2019 1636   CREATININE 1.10 06/20/2020 0205   CALCIUM 9.5 06/20/2020 0205   GFRNONAA >60 06/20/2020 0205   GFRAA 72 11/07/2019 1636   CBC    Component Value Date/Time   WBC 11.4 (H) 06/20/2020 0606   RBC 2.30 (L) 06/20/2020 0606  HGB 7.0 (L) 06/20/2020 0606   HGB 10.6 (L) 09/22/2019 1556   HCT 21.9 (L) 06/20/2020 0606   HCT 31.7 (L) 09/22/2019 1556   PLT 228 06/20/2020 0606   PLT 258 09/22/2019 1556   MCV 95.2 06/20/2020 0606   MCV 93 09/22/2019 1556   MCH 30.4 06/20/2020 0606   MCHC 32.0 06/20/2020 0606   RDW 14.6 06/20/2020 0606   RDW 12.0 09/22/2019 1556   LYMPHSABS 0.5 (L) 06/20/2020 0606   LYMPHSABS 0.4 (L) 06/25/2019 1449   MONOABS 1.1 (H) 06/20/2020 0606   EOSABS 0.1 06/20/2020 0606   EOSABS 0.0 06/25/2019 1449   BASOSABS 0.0 06/20/2020 0606   BASOSABS 0.1 06/25/2019 1449    Assessment/Plan:   Hyponatremia, hypervolemic - Urine Na < 10 so likely intravascularly depleted from 3rd spacing.  Lasix stopped due to worsening sodium levels.  Sodium improved slightly with ure-Na 30 mg bid and fluid restriction yesterday.  TSH was also over 40. S/p 12.5 grams IV albumin followed by IV lasix 40 mg x 1 yesterday with increased UOP and improved sodium levels Will dose hold IV lasix due to elevated BUN. Continue with Ure-Na 30 mg bid and follow serum sodium levels.   Difficult situation and may consider dosing with tolvaptan if Ure-Na doesn't continue to improve sodium and edema. Acute on chronic CHF - as above will try albumin and IV lasix but may need  tolvaptan Hypothyroidism - severely elevated TSH and agree with starting replacement therapy. Chronic foley h/o bladder cancer - cont with foley. Chronic respiratory failure secondary to COPD (Stage IV) on chronic oxygen. PAD s/p RAKA Sacral decubiti P. Atrial fib - per primary.  Donetta Potts, MD [employer] [phone]

## 2020-06-20 NOTE — Progress Notes (Signed)
[facility] PROGRESS NOTE    Progress Note  [name]  YIF:[id number] DOB: August 29, 1929 DOA: 06/09/2020 PCP: [name], MD    Brief Narrative:   7 white male CAD + CABG Chronic respiratory failure 2/2 COPD (stage IV) on 2 L oxygen (wears trilogy at night). Paroxysmal A. fib CHADS2 score >4 not on AC 2/2 falls/risk of bleeding Bladder cancer + chronic Foley catheter  multiple sacral decubiti + left shin ulcer PAD status post R AKA  Recent hospitalization 5/28-->06/10/2020 with NSTEMI-cardiology recommended medical management given patient's chronic comorbidities  His wife is a retired Marine scientist and his son is Stage manager in [place]  His heart failure is improved however he became severely hyponatremic prompting nephrology consult and was found to have primary hypothyroidism on work-up He is currently awaiting hemodynamic stability and improvement of his sodium prior to transfer to LTAC   Assessment/Plan:   Acute on chronic diastolic heart failure Requiring oxygen Not ready currently for transition to hospice-disposition to LTAC when stabilized  Generalized weakness/ambulatory dysfunction: BMI 21 with moderate to severe malnutrition in the setting of hypoalbuminemia Secondary to generalized debility age and chronic illnesses Has multiple decubiti as documented below  Paroxysmal atrial fibrillation CHADS2 score >4 has bled score >3 secondary to risk of falls not on DOAC Rate controlled at this time Some wide-complex tachycardia 6/11 PM-hypotension precludes use of metoprolol therefore we may use a long-acting formulation of the same Replace mag as needed  Chronic iron deficiency anemia: Follow-up with PCP as an outpatient. Iron studies show strange findings-iron is 292 saturation ratios are 87?   They were low 1 week ago No action at this time-repeat labs in the outpatient setting  Acute on chronic respiratory failure with hypoxia Now with his baseline of 2 L  of oxygen in no acute distress.  We will start tapering his steroids From 10 mg to 5 mg   Acute superimposed on hypovolemic hyponatremia Admission 08/2019 was for severe hyponatremia DDx versus hyporvolemic hyponatremia with T toast potomania--could also be hypothyroidism? Appreciate nephrology input Current management: Lasix 1 dose given 6/11, albumin 25, urea 30 twice daily Rising creatinine may preclude further use of Lasix-continues at this time on management as per nephrology  Primary hypothyroidism-new diagnosis this admission TSH is 40 --start Synthroid 50 mcg 6/10 and monitor for effect Will need TSH repeat in 2 weeks  Chronic indwelling Foley catheter: Noted next exchange on 06/21/2020  Multiple pressure ulcers of the sacrum and left shin present on admission no signs of infection: Wound care has been consulted RN Pressure Injury Documentation: Pressure Injury 01/24/20 Coccyx Mid;Lower Stage 2 -  Partial thickness loss of dermis presenting as a shallow open injury with a red, pink wound bed without slough. (Active)  01/24/20 1445  Location: Coccyx  Location Orientation: Mid;Lower  Staging: Stage 2 -  Partial thickness loss of dermis presenting as a shallow open injury with a red, pink wound bed without slough.  Wound Description (Comments):   Present on Admission: Yes     Pressure Injury 02/06/20 Buttocks Left Stage 2 -  Partial thickness loss of dermis presenting as a shallow open injury with a red, pink wound bed without slough. Above previous wound (Active)  02/06/20 1459  Location: Buttocks  Location Orientation: Left  Staging: Stage 2 -  Partial thickness loss of dermis presenting as a shallow open injury with a red, pink wound bed without slough.  Wound Description (Comments): Above previous wound  Present on Admission: Yes  Pressure Injury 04/08/20 Buttocks Medial Stage 3 -  Full thickness tissue loss. Subcutaneous fat may be visible but bone, tendon or muscle are  NOT exposed. (Active)  04/08/20 1500  Location: Buttocks  Location Orientation: Medial  Staging: Stage 3 -  Full thickness tissue loss. Subcutaneous fat may be visible but bone, tendon or muscle are NOT exposed.  Wound Description (Comments):   Present on Admission: Yes     Pressure Injury 06/15/20 Nose Anterior;Medial Unstageable - Full thickness tissue loss in which the base of the injury is covered by slough (yellow, tan, gray, green or brown) and/or eschar (tan, brown or black) in the wound bed. full thickness injury fr (Active)  06/15/20 0900  Location: Nose  Location Orientation: Anterior;Medial  Staging: Unstageable - Full thickness tissue loss in which the base of the injury is covered by slough (yellow, tan, gray, green or brown) and/or eschar (tan, brown or black) in the wound bed.  Wound Description (Comments): full thickness injury from trilogy Bipap  Present on Admission: Yes    Estimated body mass index is 21.35 kg/m as calculated from the following:   Height as of this encounter: 5\' 8"  (1.727 m).   Weight as of this encounter: 63.7 kg.   DVT prophylaxis: SBD Family Communication: Discussed with the patient's wife  [name] area code [phone] daily since admission at the bedside-see update as above Status is: Inpatient Remains inpatient appropriate because:Hemodynamically unstable Dispo: The patient is from: SNF              Anticipated d/c is to: LTAC where he can get trilogy vent              Patient currently is not medically stable to d/c.   Difficult to place patient  Code Status: Partial CODE BLUE   Subjective:   He looks a little stronger today and does not confused I had a long chat with his wife about cutting back the patient's Xanax as this may be making him slightly sleepy in the afternoon especially with his rising creatinine I also updated her about him having some wide-complex tachycardia and nursing will get back to me with regards to the  addition of metoprolol that was stopped overnight More awake coherent-very slow speech because of his not being able to understand what I am saying however interactive and able to voice concerns Asks me "am I dying"-we had a brief discussion about this  Objective:    Vitals:   06/20/20 0049 06/20/20 0205 06/20/20 0602 06/20/20 0752  BP: (!) 94/59  (!) 90/56 (!) 99/54  Pulse: 63  82 79  Resp: 18  18 18   Temp: 98.1 F (36.7 C)  97.8 F (36.6 C) 97.8 F (36.6 C)  TempSrc: Oral  Oral Oral  SpO2: 98%  98% 98%  Weight:  63.7 kg    Height:       SpO2: 98 % O2 Flow Rate (L/min): 2 L/min   Intake/Output Summary (Last 24 hours) at 06/20/2020 1034 Last data filed at 06/20/2020 4315 Gross per 24 hour  Intake 923 ml  Output 1550 ml  Net -627 ml    Filed Weights   06/18/20 0348 06/19/20 0534 06/20/20 0205  Weight: 65.9 kg 66.9 kg 63.7 kg    Exam:  Frail white male on oxygen no icterus-more alert than prior CTA B cannot appreciate rales Holosystolic murmur on monitors some wide-complex tachycardia noted He still has anasarca and has weeping upper extremities His abdomen  is soft nontender His right AKA seems intact He has no breakdown on sacrum according to nursing staff Power is 5/5   Data Reviewed:    Labs: Basic Metabolic Panel: Recent Labs  Lab 06/17/20 0225 06/17/20 1506 06/17/20 1813 06/18/20 1027 06/18/20 1819 06/19/20 0249 06/19/20 0528 06/19/20 1312 06/19/20 1726 06/19/20 2132 06/20/20 0205 06/20/20 0606  NA 122* 120*   < > 121*  122*   < > 127*   < > 123* 125* 124* 125* 127*  K 4.2 4.2  --  4.0  --  3.7  --   --   --   --  3.7  --   CL 79* 80*  --  80*  --  84*  --   --   --   --  81*  --   CO2 35* 34*  --  35*  --  37*  --   --   --   --  35*  --   GLUCOSE 81 116*  --  129*  --  88  --   --   --   --  91  --   BUN 14 15  --  51*  --  48*  --   --   --   --  88*  --   CREATININE 1.04 0.98  --  1.07  --  1.03  --   --   --   --  1.10  --   CALCIUM  8.5* 8.1*  --  9.0  --  9.2  --   --   --   --  9.5  --   PHOS  --   --   --  2.0*  --  1.8*  --   --   --   --  2.1*  --    < > = values in this interval not displayed.    GFR Estimated Creatinine Clearance: 41.8 mL/min (by C-G formula based on SCr of 1.1 mg/dL). Liver Function Tests: Recent Labs  Lab 06/15/20 1011 06/17/20 0225 06/18/20 1027 06/19/20 0249 06/20/20 0205  AST 22 21  --   --   --   ALT 16 15  --   --   --   ALKPHOS 51 55  --   --   --   BILITOT 0.7 0.7  --   --   --   PROT 4.8* 4.9*  --   --   --   ALBUMIN 2.8* 2.9* 3.0* 3.2* 3.5    No results for input(s): LIPASE, AMYLASE in the last 168 hours. No results for input(s): AMMONIA in the last 168 hours. Coagulation profile No results for input(s): INR, PROTIME in the last 168 hours. COVID-19 Labs  No results for input(s): DDIMER, FERRITIN, LDH, CRP in the last 72 hours.   Lab Results  Component Value Date   SARSCOV2NAA NEGATIVE 06/14/2020   SARSCOV2NAA NEGATIVE 06/09/2020   SARSCOV2NAA NEGATIVE 06/05/2020   [place] NEGATIVE 04/08/2020    CBC: Recent Labs  Lab 06/14/20 2332 06/19/20 0249 06/20/20 0606  WBC 9.5 7.3 11.4*  NEUTROABS  --   --  9.6*  HGB 8.6* 7.1* 7.0*  HCT 26.5* 21.6* 21.9*  MCV 93.6 92.7 95.2  PLT 298 225 228    Cardiac Enzymes: No results for input(s): CKTOTAL, CKMB, CKMBINDEX, TROPONINI in the last 168 hours. BNP (last 3 results) No results for input(s): PROBNP in the last 8760 hours. CBG: No results for input(s): GLUCAP in  the last 168 hours. D-Dimer: No results for input(s): DDIMER in the last 72 hours. Hgb A1c: No results for input(s): HGBA1C in the last 72 hours. Lipid Profile: No results for input(s): CHOL, HDL, LDLCALC, TRIG, CHOLHDL, LDLDIRECT in the last 72 hours. Thyroid function studies: Recent Labs    06/17/20 1813  TSH 40.337*    Anemia work up: No results for input(s): VITAMINB12, FOLATE, FERRITIN, TIBC, IRON, RETICCTPCT in the last 72  hours.  Sepsis Labs: Recent Labs  Lab 06/14/20 2332 06/19/20 0249 06/20/20 0606  WBC 9.5 7.3 11.4*    Microbiology Recent Results (from the past 240 hour(s))  SARS CORONAVIRUS 2 (TAT 6-24 HRS) Nasopharyngeal Nasopharyngeal Swab     Status: None   Collection Time: 06/14/20  9:50 PM   Specimen: Nasopharyngeal Swab  Result Value Ref Range Status   SARS Coronavirus 2 NEGATIVE NEGATIVE Final    Comment: (NOTE) SARS-CoV-2 target nucleic acids are NOT DETECTED.  The SARS-CoV-2 RNA is generally detectable in upper and lower respiratory specimens during the acute phase of infection. Negative results do not preclude SARS-CoV-2 infection, do not rule out co-infections with other pathogens, and should not be used as the sole basis for treatment or other patient management decisions. Negative results must be combined with clinical observations, patient history, and epidemiological information. The expected result is Negative.  Fact Sheet for Patients: SugarRoll.be  Fact Sheet for Healthcare Providers: https://www.woods-mathews.com/  This test is not yet approved or cleared by the [place] FDA and  has been authorized for detection and/or diagnosis of SARS-CoV-2 by FDA under an Emergency Use Authorization (EUA). This EUA will remain  in effect (meaning this test can be used) for the duration of the COVID-19 declaration under Se ction 564(b)(1) of the Act, 21 U.S.C. section 360bbb-3(b)(1), unless the authorization is terminated or revoked sooner.  Performed at [facility], [place] [address] [place], [place] [address]      Medications:    ALPRAZolam  0.5 mg Oral QHS   aspirin EC  81 mg Oral Daily   atorvastatin  40 mg Oral QHS   bisoprolol  2.5 mg Oral Daily   budesonide-formoterol  2 puff Inhalation BID   calcium carbonate  1 tablet Oral TID   cholecalciferol  2,000 Units Oral Q lunch   docusate sodium  100 mg Oral BID    enoxaparin (LOVENOX) injection  40 mg Subcutaneous Q24H   ferrous sulfate  325 mg Oral Q breakfast   finasteride  5 mg Oral Q lunch   FLUoxetine  10 mg Oral Daily   fluticasone  1-2 spray Each Nare Daily   Gerhardt's butt cream   Topical BID   guaiFENesin  600 mg Oral BID   levothyroxine  50 mcg Oral Q0600   multivitamin  1 tablet Oral Daily   mupirocin ointment   Topical Daily   pantoprazole  40 mg Oral Daily   polyethylene glycol  17 g Oral Daily   predniSONE  5 mg Oral Q breakfast   sodium chloride  1 drop Both Eyes QID   sodium chloride flush  3 mL Intravenous Q12H   Polyethyl Glycol-Propyl Glycol  1 drop Both Eyes TID   Tiotropium Bromide Monohydrate  2 puff Inhalation Q0600   urea  30 g Oral BID   vitamin B-12  1,000 mcg Oral Daily   Continuous Infusions:  sodium chloride         LOS: 6 days   Jai-Gurmukh [employer]  [facility]  06/20/2020, 10:34 AM

## 2020-06-20 NOTE — Progress Notes (Signed)
Family at bedside states pt will place himself on Home trilogy.

## 2020-06-21 DIAGNOSIS — Z515 Encounter for palliative care: Secondary | ICD-10-CM

## 2020-06-21 DIAGNOSIS — Z66 Do not resuscitate: Secondary | ICD-10-CM

## 2020-06-21 DIAGNOSIS — Z7189 Other specified counseling: Secondary | ICD-10-CM

## 2020-06-21 LAB — CBC
HCT: 28.6 % — ABNORMAL LOW (ref 39.0–52.0)
Hemoglobin: 9.1 g/dL — ABNORMAL LOW (ref 13.0–17.0)
MCH: 30.3 pg (ref 26.0–34.0)
MCHC: 31.8 g/dL (ref 30.0–36.0)
MCV: 95.3 fL (ref 80.0–100.0)
Platelets: 244 10*3/uL (ref 150–400)
RBC: 3 MIL/uL — ABNORMAL LOW (ref 4.22–5.81)
RDW: 16.3 % — ABNORMAL HIGH (ref 11.5–15.5)
WBC: 21.1 10*3/uL — ABNORMAL HIGH (ref 4.0–10.5)
nRBC: 0 % (ref 0.0–0.2)

## 2020-06-21 LAB — COMPREHENSIVE METABOLIC PANEL
ALT: 18 U/L (ref 0–44)
AST: 25 U/L (ref 15–41)
Albumin: 3.5 g/dL (ref 3.5–5.0)
Alkaline Phosphatase: 61 U/L (ref 38–126)
Anion gap: 6 (ref 5–15)
BUN: 96 mg/dL — ABNORMAL HIGH (ref 8–23)
CO2: 36 mmol/L — ABNORMAL HIGH (ref 22–32)
Calcium: 9.9 mg/dL (ref 8.9–10.3)
Chloride: 85 mmol/L — ABNORMAL LOW (ref 98–111)
Creatinine, Ser: 1.13 mg/dL (ref 0.61–1.24)
GFR, Estimated: >60 mL/min (ref >60)
Glucose, Bld: 121 mg/dL — ABNORMAL HIGH (ref 70–99)
Potassium: 3.6 mmol/L (ref 3.5–5.1)
Sodium: 127 mmol/L — ABNORMAL LOW (ref 135–145)
Total Bilirubin: 1.2 mg/dL (ref 0.3–1.2)
Total Protein: 5.4 g/dL — ABNORMAL LOW (ref 6.5–8.1)

## 2020-06-21 LAB — CBC WITH DIFFERENTIAL/PLATELET
Abs Immature Granulocytes: 0.15 10*3/uL — ABNORMAL HIGH (ref 0.00–0.07)
Basophils Absolute: 0 10*3/uL (ref 0.0–0.1)
Basophils Relative: 0 %
Eosinophils Absolute: 0 10*3/uL (ref 0.0–0.5)
Eosinophils Relative: 0 %
HCT: 28 % — ABNORMAL LOW (ref 39.0–52.0)
Hemoglobin: 9.2 g/dL — ABNORMAL LOW (ref 13.0–17.0)
Immature Granulocytes: 1 %
Lymphocytes Relative: 1 %
Lymphs Abs: 0.2 10*3/uL — ABNORMAL LOW (ref 0.7–4.0)
MCH: 30.7 pg (ref 26.0–34.0)
MCHC: 32.9 g/dL (ref 30.0–36.0)
MCV: 93.3 fL (ref 80.0–100.0)
Monocytes Absolute: 0.7 10*3/uL (ref 0.1–1.0)
Monocytes Relative: 4 %
Neutro Abs: 19 10*3/uL — ABNORMAL HIGH (ref 1.7–7.7)
Neutrophils Relative %: 94 %
Platelets: 243 10*3/uL (ref 150–400)
RBC: 3 MIL/uL — ABNORMAL LOW (ref 4.22–5.81)
RDW: 16.1 % — ABNORMAL HIGH (ref 11.5–15.5)
WBC: 20.1 10*3/uL — ABNORMAL HIGH (ref 4.0–10.5)
nRBC: 0 % (ref 0.0–0.2)

## 2020-06-21 LAB — SODIUM
Sodium: 125 mmol/L — ABNORMAL LOW (ref 135–145)
Sodium: 127 mmol/L — ABNORMAL LOW (ref 135–145)

## 2020-06-21 LAB — IRON AND TIBC
Iron: 69 ug/dL (ref 45–182)
Saturation Ratios: 21 % (ref 17.9–39.5)
TIBC: 326 ug/dL (ref 250–450)
UIBC: 257 ug/dL

## 2020-06-21 LAB — TROPONIN I (HIGH SENSITIVITY)
Troponin I (High Sensitivity): 258 ng/L (ref <18)
Troponin I (High Sensitivity): 272 ng/L (ref <18)

## 2020-06-21 LAB — PREPARE RBC (CROSSMATCH)

## 2020-06-21 LAB — FERRITIN: Ferritin: 322 ng/mL (ref 24–336)

## 2020-06-21 LAB — HEPARIN LEVEL (UNFRACTIONATED): Heparin Unfractionated: 0.38 IU/mL (ref 0.30–0.70)

## 2020-06-21 MED ORDER — METOPROLOL TARTRATE 12.5 MG HALF TABLET
12.5000 mg | ORAL_TABLET | Freq: Two times a day (BID) | ORAL | Status: DC
  Administered 2020-06-21: 12.5 mg via ORAL
  Filled 2020-06-21 (×2): qty 1

## 2020-06-21 MED ORDER — ASPIRIN 325 MG PO TABS
325.0000 mg | ORAL_TABLET | Freq: Every day | ORAL | Status: DC
  Administered 2020-06-21: 325 mg via ORAL
  Filled 2020-06-21: qty 1

## 2020-06-21 MED ORDER — HEPARIN (PORCINE) 25000 UT/250ML-% IV SOLN
700.0000 [IU]/h | INTRAVENOUS | Status: DC
  Administered 2020-06-21: 700 [IU]/h via INTRAVENOUS
  Filled 2020-06-21: qty 250

## 2020-06-21 MED ORDER — BOOST / RESOURCE BREEZE PO LIQD CUSTOM
1.0000 | Freq: Three times a day (TID) | ORAL | Status: DC
  Administered 2020-06-21: 1 via ORAL

## 2020-06-21 MED ORDER — FOSFOMYCIN TROMETHAMINE 3 G PO PACK
3.0000 g | PACK | Freq: Once | ORAL | Status: AC
  Administered 2020-06-21: 3 g via ORAL

## 2020-06-21 MED ORDER — NITROGLYCERIN 2 % TD OINT
0.5000 [in_us] | TOPICAL_OINTMENT | Freq: Four times a day (QID) | TRANSDERMAL | Status: DC
  Administered 2020-06-21: 0.5 [in_us] via TOPICAL
  Filled 2020-06-21: qty 30

## 2020-06-21 MED ORDER — ALBUMIN HUMAN 25 % IV SOLN
25.0000 g | Freq: Once | INTRAVENOUS | Status: AC
  Administered 2020-06-21: 25 g via INTRAVENOUS
  Filled 2020-06-21: qty 100

## 2020-06-21 MED ORDER — MORPHINE SULFATE 10 MG/5ML PO SOLN
2.5000 mg | ORAL | Status: DC | PRN
  Administered 2020-06-21: 2.5 mg via ORAL
  Filled 2020-06-21: qty 2

## 2020-06-21 MED ORDER — FUROSEMIDE 10 MG/ML IJ SOLN
40.0000 mg | Freq: Once | INTRAMUSCULAR | Status: AC
  Administered 2020-06-21: 40 mg via INTRAVENOUS
  Filled 2020-06-21: qty 4

## 2020-06-21 NOTE — Progress Notes (Signed)
ANTICOAGULATION CONSULT NOTE - Initial Consult  Pharmacy Consult for Heparin Indication: chest pain/ACS  Allergies  Allergen Reactions   Cefdinir Diarrhea and Other (See Comments)    Severe Diarrhea   Nitrofurantoin Swelling and Other (See Comments)    Hand Swelling   Sulfa Antibiotics Anaphylaxis and Swelling   Sulfonamide Derivatives Swelling and Other (See Comments)    Facial/tongue swelling   Tape Other (See Comments)    SKIN IS VERY THIN AND TEARS EASILY!!!!! Please do NOT use "plastic" tape- USE PAPER!!   Ciprofloxacin Itching, Rash and Other (See Comments)    Red itchy hands   Nitrofurantoin Swelling and Other (See Comments)    Swollen hands   Amoxicillin Er Other (See Comments)    Frequest urination. Pt reports "not really" allergic 01/24/20   Amoxicillin-Pot Clavulanate     Other reaction(s): groggy   Doxycycline Other (See Comments)    "Felt terrible" Pt wife reports "not really" allergic 01/24/20   Keflex [Cephalexin] Other (See Comments)    Pt does not recall reaction    Mirtazapine Other (See Comments)    Pt does not recall reaction   Ranolazine Other (See Comments)    Inability to ambulate    Chlorhexidine Gluconate Rash   Levaquin [Levofloxacin] Itching and Rash   Sertraline Anxiety and Other (See Comments)    Makes the patient jittery    Patient Measurements: Height: 5\' 8"  (172.7 cm) Weight: 63.5 kg (139 lb 15.9 oz) IBW/kg (Calculated) : 68.4 Heparin Dosing Weight: 57.5 kg  Vital Signs: Temp: 97.9 F (36.6 C) (06/13 2021) Temp Source: Oral (06/13 1643) BP: 108/65 (06/13 2021) Pulse Rate: 90 (06/13 2021)  Labs: Recent Labs    06/19/20 0249 06/20/20 0205 06/20/20 0606 06/21/20 0925 06/21/20 1158 06/21/20 1316 06/21/20 2042  HGB 7.1*  --  7.0* 9.2*  --  9.1*  --   HCT 21.6*  --  21.9* 28.0*  --  28.6*  --   PLT 225  --  228 243  --  244  --   HEPARINUNFRC  --   --   --   --   --   --  0.38  CREATININE 1.03 1.10  --  1.13  --   --   --    TROPONINIHS  --   --   --   --  258* 272*  --      Estimated Creatinine Clearance: 40.6 mL/min (by C-G formula based on SCr of 1.13 mg/dL).   Medical History: Past Medical History:  Diagnosis Date   Anxiety    Arthritis    "generalized" (04/04/2017)   Asthma    Atrial fibrillation (HCC)    Basal cell carcinoma    "left ear"   BPH (benign prostatic hyperplasia)    severe; s/p multiple biopsies   CAD (coronary artery disease)    Carotid artery disease (HCC)    Chronic diastolic CHF (congestive heart failure) ([place]) 2019   Chronic pain 04/08/2020   Chronic respiratory failure (HCC)    Chronic rhinitis    COPD (chronic obstructive pulmonary disease) (HCC)    2L [place] O2   Depression    Dilation of biliary tract    Elevated troponin 03/09/2017   Fall 04/08/2020   Gallstones    GERD (gastroesophageal reflux disease)    History of blood transfusion    "w/his CABG" (04/04/2017)   History of kidney stones    Hyperlipidemia    Hypertension    On home oxygen  therapy    "~ 24/7" (04/04/2017)   Peripheral vascular disease ([place])    Pneumonia 2019   RBBB    Squamous cell carcinoma of skin 04/23/2013   in situ-Right flank (txpbx)   Squamous cell carcinoma of skin 01/23/2017   [place]/ in siut- Left ear rim (CX3FU)   Syncope and collapse    Ureteral tumor 08/2015   had endoscopic procedure for evaluation, unable to reach for biopsy   Urinary catheter (Foley) change required     Medications:  Medications Prior to Admission  Medication Sig Dispense Refill Last Dose   acetaminophen (TYLENOL) 500 MG tablet Take 500 mg by mouth 3 (three) times daily.   06/10/2020   ALPRAZolam (XANAX) 0.5 MG tablet Take 1 tablet (0.5 mg total) by mouth 3 (three) times daily as needed for anxiety. (Patient taking differently: Take 0.5 mg by mouth 3 (three) times daily.) 3 tablet 0 06/10/2020   aspirin 81 MG tablet Take 81 mg by mouth daily.   06/10/2020   atorvastatin (LIPITOR) 40 MG tablet Take 1 tablet (40 mg  total) by mouth at bedtime. 30 tablet 0 06/09/2020   calcium carbonate (TUMS - DOSED IN MG ELEMENTAL CALCIUM) 500 MG chewable tablet Chew 1 tablet (200 mg of elemental calcium total) by mouth 2 (two) times daily as needed for indigestion or heartburn.   unk   Cholecalciferol (VITAMIN D3) 50 MCG (2000 UT) TABS Take 2,000 Units by mouth daily with lunch. (Patient taking differently: Take 2,000 Units by mouth daily.) 30 tablet 0 06/10/2020   docusate sodium (COLACE) 100 MG capsule Take 100 mg by mouth at bedtime.   06/09/2020   ferrous sulfate 325 (65 FE) MG tablet Take 325 mg by mouth daily with breakfast.   06/10/2020   finasteride (PROSCAR) 5 MG tablet Take 1 tablet (5 mg total) by mouth daily with lunch. 30 tablet 0 06/09/2020   FLUoxetine (PROZAC) 10 MG capsule Take 10 mg by mouth at bedtime.   06/09/2020   fluticasone (FLONASE) 50 MCG/ACT nasal spray Place 2 sprays into both nostrils daily.   06/10/2020   furosemide (LASIX) 20 MG tablet Take 40 mg by mouth 2 (two) times daily.   06/10/2020   guaiFENesin (MUCINEX) 600 MG 12 hr tablet Take 1 tablet (600 mg total) by mouth 2 (two) times daily. 60 tablet 0 06/10/2020   levalbuterol (XOPENEX HFA) 45 MCG/ACT inhaler Inhale 2 puffs into the lungs every 4 (four) hours as needed for wheezing or shortness of breath. 15 g 5 unk   metoprolol tartrate (LOPRESSOR) 25 MG tablet Take 0.5 tablets (12.5 mg total) by mouth 3 (three) times daily. 90 tablet 0 06/10/2020   Multiple Vitamins-Minerals (PRESERVISION AREDS 2) CAPS Take 1 capsule by mouth daily.   06/10/2020   NITROSTAT 0.4 MG SL tablet Place 0.4 mg under the tongue every 5 (five) minutes as needed for chest pain.   unk   oxyCODONE (OXY IR/ROXICODONE) 5 MG immediate release tablet Take 0.5 tablets (2.5 mg total) by mouth every 6 (six) hours as needed for breakthrough pain. 3 tablet 0 unk   OXYGEN Inhale 2 L/min into the lungs continuous.       pantoprazole (PROTONIX) 40 MG tablet TAKE 1 TABLET(40 MG) BY MOUTH DAILY 30 TO 60  MINUTES BEFORE FIRST MEAL OF THE DAY (Patient taking differently: Take 40 mg by mouth daily.) 30 tablet 3 06/10/2020   Polyethyl Glycol-Propyl Glycol (SYSTANE) 0.4-0.3 % SOLN Apply 1 drop to eye 2 (two) times  daily as needed (eye irritation). (Patient taking differently: Apply 1 drop to eye in the morning, at noon, and at bedtime.)  0 06/10/2020   polyethylene glycol (MIRALAX / GLYCOLAX) 17 g packet Take 17 g by mouth daily. 14 each 0 06/10/2020   predniSONE (DELTASONE) 10 MG tablet Take 1 tablet (10 mg total) by mouth daily. 30 tablet 5 06/10/2020   predniSONE (DELTASONE) 10 MG tablet Take 10 mg by mouth daily as needed (copd flare up).   unk   sodium chloride (MURO 128) 5 % ophthalmic solution Place 1 drop into both eyes 4 (four) times daily. 15 mL  06/10/2020   SPIRIVA RESPIMAT 2.5 MCG/ACT AERS INHALE 2 PUFFS BY MOUTH EVERY DAY (Patient taking differently: Take 2 puffs by mouth daily.) 4 g 11 06/10/2020   SYMBICORT 160-4.5 MCG/ACT inhaler Inhale 2 puffs into the lungs 2 (two) times daily. 1 each 5 06/10/2020   vitamin B-12 (CYANOCOBALAMIN) 1000 MCG tablet Take 1 tablet (1,000 mcg total) by mouth daily. 30 tablet 0 06/10/2020   COLLAGEN MATRIX-SILVER EX Apply 1 tablet topically every other day. (Patient not taking: No sig reported)   Not Taking at Unknown time   famotidine (PEPCID) 20 MG tablet Take 20 mg by mouth at bedtime as needed for heartburn or indigestion. (Patient not taking: No sig reported)   Not Taking at Unknown time   levalbuterol (XOPENEX) 0.63 MG/3ML nebulizer solution Take 3 mLs (0.63 mg total) by nebulization every 4 (four) hours as needed for wheezing or shortness of breath. (Patient not taking: No sig reported) 120 mL 3 Not Taking at Unknown time   Scheduled:   sodium chloride   Intravenous Once   ALPRAZolam  0.5 mg Oral QHS   aspirin  325 mg Oral Daily   atorvastatin  40 mg Oral QHS   budesonide-formoterol  2 puff Inhalation BID   calcium carbonate  1 tablet Oral TID   cholecalciferol   2,000 Units Oral Q lunch   diphenhydrAMINE  25 mg Oral Once   docusate sodium  100 mg Oral BID   feeding supplement  1 Container Oral TID BM   ferrous sulfate  325 mg Oral Q breakfast   finasteride  5 mg Oral Q lunch   FLUoxetine  10 mg Oral Daily   fluticasone  1-2 spray Each Nare Daily   Gerhardt's butt cream   Topical BID   guaiFENesin  600 mg Oral BID   levothyroxine  50 mcg Oral Q0600   metoprolol tartrate  12.5 mg Oral BID   multivitamin  1 tablet Oral Daily   mupirocin ointment   Topical Daily   nitroGLYCERIN  0.5 inch Topical Q6H   pantoprazole  40 mg Oral Daily   polyethylene glycol  17 g Oral Daily   predniSONE  5 mg Oral Q breakfast   sodium chloride  1 drop Both Eyes QID   sodium chloride flush  3 mL Intravenous Q12H   Polyethyl Glycol-Propyl Glycol  1 drop Both Eyes TID   Tiotropium Bromide Monohydrate  2 puff Inhalation Q0600   urea  30 g Oral BID   vitamin B-12  1,000 mcg Oral Daily   Infusions:   sodium chloride     heparin 700 Units/hr (06/21/20 1623)   Assessment: 66 yom with pmh of CAD + CABG, Chronic respiratory failure 2/2 COPD (stage IV) on 2 L oxygen (wears trilogy at night), Bladder cancer (chronic Foley catheter), sacral decubiti & left shin ulcer, & PAD status  post R AKA.  Pharmacy has been asked to dose heparin for ACS. Patient most recently on 30mg  SQ lovenox. Last dose 6/12 at ~2100.  Patient with ongoing anemia. Hgb 7s this admission. H/H 9.2/28 after transfusion overnight.  Heparin came back therapeutic tonight. We will continue with the same rate and check it again in AM. Goal of Therapy:  Heparin level 0.3-0.7 units/ml Monitor platelets by anticoagulation protocol: Yes   Plan:  Cont heparin at 700 units/hr F/u with AM level  Onnie Boer, PharmD, BCIDP, AAHIVP, CPP Infectious Disease Pharmacist 06/21/2020 10:00 PM

## 2020-06-21 NOTE — Progress Notes (Signed)
ANTICOAGULATION CONSULT NOTE - Initial Consult  Pharmacy Consult for Heparin Indication: chest pain/ACS  Allergies  Allergen Reactions   Cefdinir Diarrhea and Other (See Comments)    Severe Diarrhea   Nitrofurantoin Swelling and Other (See Comments)    Hand Swelling   Sulfa Antibiotics Anaphylaxis and Swelling   Sulfonamide Derivatives Swelling and Other (See Comments)    Facial/tongue swelling   Tape Other (See Comments)    SKIN IS VERY THIN AND TEARS EASILY!!!!! Please do NOT use "plastic" tape- USE PAPER!!   Ciprofloxacin Itching, Rash and Other (See Comments)    Red itchy hands   Nitrofurantoin Swelling and Other (See Comments)    Swollen hands   Amoxicillin Er Other (See Comments)    Frequest urination. Pt reports "not really" allergic 01/24/20   Amoxicillin-Pot Clavulanate     Other reaction(s): groggy   Doxycycline Other (See Comments)    "Felt terrible" Pt wife reports "not really" allergic 01/24/20   Keflex [Cephalexin] Other (See Comments)    Pt does not recall reaction    Mirtazapine Other (See Comments)    Pt does not recall reaction   Ranolazine Other (See Comments)    Inability to ambulate    Chlorhexidine Gluconate Rash   Levaquin [Levofloxacin] Itching and Rash   Sertraline Anxiety and Other (See Comments)    Makes the patient jittery    Patient Measurements: Height: 5\' 8"  (172.7 cm) Weight: 63.5 kg (139 lb 15.9 oz) IBW/kg (Calculated) : 68.4 Heparin Dosing Weight: 57.5 kg  Vital Signs: Temp: 98.3 F (36.8 C) (06/13 1114) Temp Source: Oral (06/13 1114) BP: 95/55 (06/13 1224) Pulse Rate: 90 (06/13 1224)  Labs: Recent Labs    06/19/20 0249 06/20/20 0205 06/20/20 0606 06/21/20 0925  HGB 7.1*  --  7.0* 9.2*  HCT 21.6*  --  21.9* 28.0*  PLT 225  --  228 243  CREATININE 1.03 1.10  --  1.13    Estimated Creatinine Clearance: 40.6 mL/min (by C-G formula based on SCr of 1.13 mg/dL).   Medical History: Past Medical History:  Diagnosis Date    Anxiety    Arthritis    "generalized" (04/04/2017)   Asthma    Atrial fibrillation (HCC)    Basal cell carcinoma    "left ear"   BPH (benign prostatic hyperplasia)    severe; s/p multiple biopsies   CAD (coronary artery disease)    Carotid artery disease (HCC)    Chronic diastolic CHF (congestive heart failure) ([place]) 2019   Chronic pain 04/08/2020   Chronic respiratory failure (HCC)    Chronic rhinitis    COPD (chronic obstructive pulmonary disease) (HCC)    2L [place] O2   Depression    Dilation of biliary tract    Elevated troponin 03/09/2017   Fall 04/08/2020   Gallstones    GERD (gastroesophageal reflux disease)    History of blood transfusion    "w/his CABG" (04/04/2017)   History of kidney stones    Hyperlipidemia    Hypertension    On home oxygen therapy    "~ 24/7" (04/04/2017)   Peripheral vascular disease ([place])    Pneumonia 2019   RBBB    Squamous cell carcinoma of skin 04/23/2013   in situ-Right flank (txpbx)   Squamous cell carcinoma of skin 01/23/2017   [place]/ in siut- Left ear rim (CX3FU)   Syncope and collapse    Ureteral tumor 08/2015   had endoscopic procedure for evaluation, unable to reach for biopsy  Urinary catheter (Foley) change required     Medications:  Medications Prior to Admission  Medication Sig Dispense Refill Last Dose   acetaminophen (TYLENOL) 500 MG tablet Take 500 mg by mouth 3 (three) times daily.   06/10/2020   ALPRAZolam (XANAX) 0.5 MG tablet Take 1 tablet (0.5 mg total) by mouth 3 (three) times daily as needed for anxiety. (Patient taking differently: Take 0.5 mg by mouth 3 (three) times daily.) 3 tablet 0 06/10/2020   aspirin 81 MG tablet Take 81 mg by mouth daily.   06/10/2020   atorvastatin (LIPITOR) 40 MG tablet Take 1 tablet (40 mg total) by mouth at bedtime. 30 tablet 0 06/09/2020   calcium carbonate (TUMS - DOSED IN MG ELEMENTAL CALCIUM) 500 MG chewable tablet Chew 1 tablet (200 mg of elemental calcium total) by mouth 2 (two) times daily  as needed for indigestion or heartburn.   unk   Cholecalciferol (VITAMIN D3) 50 MCG (2000 UT) TABS Take 2,000 Units by mouth daily with lunch. (Patient taking differently: Take 2,000 Units by mouth daily.) 30 tablet 0 06/10/2020   docusate sodium (COLACE) 100 MG capsule Take 100 mg by mouth at bedtime.   06/09/2020   ferrous sulfate 325 (65 FE) MG tablet Take 325 mg by mouth daily with breakfast.   06/10/2020   finasteride (PROSCAR) 5 MG tablet Take 1 tablet (5 mg total) by mouth daily with lunch. 30 tablet 0 06/09/2020   FLUoxetine (PROZAC) 10 MG capsule Take 10 mg by mouth at bedtime.   06/09/2020   fluticasone (FLONASE) 50 MCG/ACT nasal spray Place 2 sprays into both nostrils daily.   06/10/2020   furosemide (LASIX) 20 MG tablet Take 40 mg by mouth 2 (two) times daily.   06/10/2020   guaiFENesin (MUCINEX) 600 MG 12 hr tablet Take 1 tablet (600 mg total) by mouth 2 (two) times daily. 60 tablet 0 06/10/2020   levalbuterol (XOPENEX HFA) 45 MCG/ACT inhaler Inhale 2 puffs into the lungs every 4 (four) hours as needed for wheezing or shortness of breath. 15 g 5 unk   metoprolol tartrate (LOPRESSOR) 25 MG tablet Take 0.5 tablets (12.5 mg total) by mouth 3 (three) times daily. 90 tablet 0 06/10/2020   Multiple Vitamins-Minerals (PRESERVISION AREDS 2) CAPS Take 1 capsule by mouth daily.   06/10/2020   NITROSTAT 0.4 MG SL tablet Place 0.4 mg under the tongue every 5 (five) minutes as needed for chest pain.   unk   oxyCODONE (OXY IR/ROXICODONE) 5 MG immediate release tablet Take 0.5 tablets (2.5 mg total) by mouth every 6 (six) hours as needed for breakthrough pain. 3 tablet 0 unk   OXYGEN Inhale 2 L/min into the lungs continuous.       pantoprazole (PROTONIX) 40 MG tablet TAKE 1 TABLET(40 MG) BY MOUTH DAILY 30 TO 60 MINUTES BEFORE FIRST MEAL OF THE DAY (Patient taking differently: Take 40 mg by mouth daily.) 30 tablet 3 06/10/2020   Polyethyl Glycol-Propyl Glycol (SYSTANE) 0.4-0.3 % SOLN Apply 1 drop to eye 2 (two) times daily  as needed (eye irritation). (Patient taking differently: Apply 1 drop to eye in the morning, at noon, and at bedtime.)  0 06/10/2020   polyethylene glycol (MIRALAX / GLYCOLAX) 17 g packet Take 17 g by mouth daily. 14 each 0 06/10/2020   predniSONE (DELTASONE) 10 MG tablet Take 1 tablet (10 mg total) by mouth daily. 30 tablet 5 06/10/2020   predniSONE (DELTASONE) 10 MG tablet Take 10 mg by mouth daily as  needed (copd flare up).   unk   sodium chloride (MURO 128) 5 % ophthalmic solution Place 1 drop into both eyes 4 (four) times daily. 15 mL  06/10/2020   SPIRIVA RESPIMAT 2.5 MCG/ACT AERS INHALE 2 PUFFS BY MOUTH EVERY DAY (Patient taking differently: Take 2 puffs by mouth daily.) 4 g 11 06/10/2020   SYMBICORT 160-4.5 MCG/ACT inhaler Inhale 2 puffs into the lungs 2 (two) times daily. 1 each 5 06/10/2020   vitamin B-12 (CYANOCOBALAMIN) 1000 MCG tablet Take 1 tablet (1,000 mcg total) by mouth daily. 30 tablet 0 06/10/2020   COLLAGEN MATRIX-SILVER EX Apply 1 tablet topically every other day. (Patient not taking: No sig reported)   Not Taking at Unknown time   famotidine (PEPCID) 20 MG tablet Take 20 mg by mouth at bedtime as needed for heartburn or indigestion. (Patient not taking: No sig reported)   Not Taking at Unknown time   levalbuterol (XOPENEX) 0.63 MG/3ML nebulizer solution Take 3 mLs (0.63 mg total) by nebulization every 4 (four) hours as needed for wheezing or shortness of breath. (Patient not taking: No sig reported) 120 mL 3 Not Taking at Unknown time   Scheduled:   sodium chloride   Intravenous Once   ALPRAZolam  0.5 mg Oral QHS   aspirin  325 mg Oral Daily   atorvastatin  40 mg Oral QHS   budesonide-formoterol  2 puff Inhalation BID   calcium carbonate  1 tablet Oral TID   cholecalciferol  2,000 Units Oral Q lunch   diphenhydrAMINE  25 mg Oral Once   docusate sodium  100 mg Oral BID   ferrous sulfate  325 mg Oral Q breakfast   finasteride  5 mg Oral Q lunch   FLUoxetine  10 mg Oral Daily    fluticasone  1-2 spray Each Nare Daily   Gerhardt's butt cream   Topical BID   guaiFENesin  600 mg Oral BID   levothyroxine  50 mcg Oral Q0600   metoprolol tartrate  12.5 mg Oral BID   multivitamin  1 tablet Oral Daily   mupirocin ointment   Topical Daily   nitroGLYCERIN  0.5 inch Topical Q6H   pantoprazole  40 mg Oral Daily   polyethylene glycol  17 g Oral Daily   predniSONE  5 mg Oral Q breakfast   sodium chloride  1 drop Both Eyes QID   sodium chloride flush  3 mL Intravenous Q12H   Polyethyl Glycol-Propyl Glycol  1 drop Both Eyes TID   Tiotropium Bromide Monohydrate  2 puff Inhalation Q0600   urea  30 g Oral BID   vitamin B-12  1,000 mcg Oral Daily   Infusions:   sodium chloride     heparin     Assessment: 85 yom with pmh of CAD + CABG, Chronic respiratory failure 2/2 COPD (stage IV) on 2 L oxygen (wears trilogy at night), Bladder cancer (chronic Foley catheter), sacral decubiti & left shin ulcer, & PAD status post R AKA.  Pharmacy has been asked to dose heparin for ACS. Patient most recently on 30mg  SQ lovenox. Last dose 6/12 at ~2100.  Patient with ongoing anemia. Hgb 7s this admission. H/H 9.2/28 after transfusion overnight.  Goal of Therapy:  Heparin level 0.3-0.7 units/ml Monitor platelets by anticoagulation protocol: Yes   Plan:  Start heparin infusion at 700 units/hr Check anti-Xa level in 8 hours and daily while on heparin Continue to monitor H&H and platelets  Lorelei Pont, PharmD, BCPS 06/21/2020 1:08 PM Clinical  Pharmacist -  [phone]

## 2020-06-21 NOTE — Progress Notes (Signed)
   Referral received from NP with [facility]. She has asked that I reach back out to the pt and his wife to discuss hospice facility placement. She stated she was about to go in and meet with them and is recommending comfort care.   I spoke to the pt's wife over the phone to confirm interest and see if we could meet. She reported at that time that there were a lot of things going on the doctors have come in and feel that her husband is having another heart attack. He has had CP and is looking good.  I offer to call back at a later time but she wanted to talk. So I again discussed comfort care and seh wasn't sure that stopping the trilogy machine was something that they would be interested in. I explained that I would have a discussion with my MD at the Hospice home in [facility] and see if we could make an exception. I did have this discussion but based on new evidence of new MI and the pt's life expectancy of 48 hours or less according to the wife. My MD did not feel that it was appropriate to bring him in with trilogy but more to focus on comfort and him passing peacefully. I explained this to the wife when I called her back and she does state that she is ok with this and that she would like to proceed with a bed at [facility] home in [facility]. I let her know we did not have bed availability today but as soon as something opens up I will be happy to call and let the hospital and her know sot move forward with a discharge plan.   Webb Silversmith RN [phone]

## 2020-06-21 NOTE — Progress Notes (Signed)
Patient has home Trilogy at bedside. RT assisted spouse at bedside connecting the oxygen tubing to machine.  Patient was able to place mask with no assistance.

## 2020-06-21 NOTE — Progress Notes (Signed)
[facility] PROGRESS NOTE    Progress Note  [name]  HFW:[id number] DOB: [birth year] DOA: 07/01/2020 PCP: [name], MD    Brief Narrative:   74 white male CAD + CABG Chronic respiratory failure 2/2 COPD (stage IV) on 2 L oxygen (wears trilogy at night). Paroxysmal A. fib CHADS2 score >4 not on AC 2/2 falls/risk of bleeding Bladder cancer + chronic Foley catheter  multiple sacral decubiti + left shin ulcer PAD status post R AKA  Recent hospitalization 5/28-->06/10/2020 with NSTEMI-cardiology recommended medical management given patient's chronic comorbidities  His wife is a retired Marine scientist and his son is Stage manager in [place]  His heart failure is improved however he became severely hyponatremic prompting nephrology consult and was found to have primary hypothyroidism on work-up He is currently awaiting hemodynamic stability and improvement of his sodium prior to transfer to LTAC   Assessment/Plan:   NSTEMI Developed recurrent chest pain 6/13 Troponin 200s EKG shows dynamic ST-T wave depressions across precordium Discussed with cardiology Dr. Adah Salvage both agree patient is a very poor invasive interventional candidate Cannot give beta-blocker because of hypotension, ACE relatively contraindicated because of azotemia Start heparin GTT I, Nitropaste half inch aspirin 325 given Exceedingly poor prognosis overall Wife wishes to pursue hospice in the next 24 to 48 hours To that end I have started morphine for relief from angina but also this should be escalated in the next 24 hours if he appears uncomfortable more for comfort and end-of-life  Acute on chronic diastolic heart failure Requiring oxygen Not ready currently for transition to hospice-disposition to LTAC when stabilized  Generalized weakness/ambulatory dysfunction: BMI 21 with moderate to severe malnutrition in the setting of hypoalbuminemia Secondary to generalized debility age and chronic  illnesses Has multiple decubiti as documented below  Paroxysmal atrial fibrillation CHADS2 score >4 has bled score >3 secondary to risk of falls not on DOAC Rate controlled at this time Some wide-complex tachycardia 6/11 PM-see above discussion regarding beta-blocker  Chronic iron deficiency anemia: Follow-up with PCP as an outpatient. Iron studies show strange findings-iron is 292 saturation ratios are 87?    Acute on chronic respiratory failure with hypoxia Now with his baseline of 2 L of oxygen in no acute distress.  We will start tapering his steroids From 10 mg to 5 mg   Acute superimposed on hypovolemic hyponatremia Admission 08/2019 was for severe hyponatremia Greatly appreciate nephrology expertise Lasix has been held, albumin has been held Check a.m. labs-stop checking every 4 sodium given hospice bound trajectory  Primary hypothyroidism-new diagnosis this admission TSH is 40 --start Synthroid 50 mcg 6/10 and monitor for effect  Chronic indwelling Foley catheter: Noted next exchange on 06/21/2020  Multiple pressure ulcers of the sacrum and left shin present on admission no signs of infection: Wound care has been consulted RN Pressure Injury Documentation: Pressure Injury 01/24/20 Coccyx Mid;Lower Stage 2 -  Partial thickness loss of dermis presenting as a shallow open injury with a red, pink wound bed without slough. (Active)  01/24/20 1445  Location: Coccyx  Location Orientation: Mid;Lower  Staging: Stage 2 -  Partial thickness loss of dermis presenting as a shallow open injury with a red, pink wound bed without slough.  Wound Description (Comments):   Present on Admission: Yes     Pressure Injury 02/06/20 Buttocks Left Stage 2 -  Partial thickness loss of dermis presenting as a shallow open injury with a red, pink wound bed without slough. Above previous wound (Active)  02/06/20 1459  Location: Buttocks  Location Orientation: Left  Staging: Stage 2 -  Partial  thickness loss of dermis presenting as a shallow open injury with a red, pink wound bed without slough.  Wound Description (Comments): Above previous wound  Present on Admission: Yes     Pressure Injury 04/08/20 Buttocks Medial Stage 3 -  Full thickness tissue loss. Subcutaneous fat may be visible but bone, tendon or muscle are NOT exposed. (Active)  04/08/20 1500  Location: Buttocks  Location Orientation: Medial  Staging: Stage 3 -  Full thickness tissue loss. Subcutaneous fat may be visible but bone, tendon or muscle are NOT exposed.  Wound Description (Comments):   Present on Admission: Yes     Pressure Injury 06/15/20 Nose Anterior;Medial Unstageable - Full thickness tissue loss in which the base of the injury is covered by slough (yellow, tan, gray, green or brown) and/or eschar (tan, brown or black) in the wound bed. full thickness injury fr (Active)  06/15/20 0900  Location: Nose  Location Orientation: Anterior;Medial  Staging: Unstageable - Full thickness tissue loss in which the base of the injury is covered by slough (yellow, tan, gray, green or brown) and/or eschar (tan, brown or black) in the wound bed.  Wound Description (Comments): full thickness injury from trilogy Bipap  Present on Admission: Yes    Estimated body mass index is 21.29 kg/m as calculated from the following:   Height as of this encounter: 5\' 8"  (1.727 m).   Weight as of this encounter: 63.5 kg.   DVT prophylaxis: SBD Family Communication: Discussed with the patient's wife  [place] area code [phone]  Discussed with the patient's son Dr. Jessie Foot in addition Discussed with the patient's daughter Alyssa at the bedside We have come to turning point in the patient's care-he is now having angina and probably ACS We have limited options Wife agrees to comfort trajectory after discussion with palliative care earlier this morning She request treatment of NSTEMI/ACS however understands that this  would likely be futile Patient's disposition if he survives the next 48 hours will be freestanding hospice-their preference is [place] [facility]  Remains inpatient appropriate because:Hemodynamically unstable Dispo: The patient is from: SNF              Anticipated d/c is to: LTAC where he can get trilogy vent              Patient currently is not medically stable to d/c.   Difficult to place patient  Code Status: Partial CODE BLUE   Subjective:   Nursing alerted me to patient having recurrent chest pain around 10 AM EKG reviewed by me showing ST-T wave changes Long discussion as above with family  Objective:    Vitals:   06/21/20 1028 06/21/20 1114 06/21/20 1133 06/21/20 1224  BP: (!) 101/56 (!) 81/54 117/62 (!) 95/55  Pulse: 82 89 92 90  Resp:  18    Temp:  98.3 F (36.8 C)    TempSrc:  Oral    SpO2: 96% 96%    Weight:      Height:       SpO2: 96 % O2 Flow Rate (L/min): 2 L/min   Intake/Output Summary (Last 24 hours) at 06/21/2020 1634 Last data filed at 06/21/2020 1200 Gross per 24 hour  Intake 823 ml  Output 1612 ml  Net -789 ml    Filed Weights   06/19/20 0534 06/20/20 0205 06/21/20 0415  Weight: 66.9 kg 63.7 kg 63.5 kg  Exam:  Cachectic frail white male on oxygen no distress at this time Mild crackles posterolaterally however no rales rhonchi S1-S2 no murmur Abdomen soft no rebound Right AKA noted with overall grade 3-4 anasarca/edema Extremely hard of hearing   Data Reviewed:    Labs: Basic Metabolic Panel: Recent Labs  Lab 06/17/20 1506 06/17/20 1813 06/18/20 1027 06/18/20 1819 06/19/20 0249 06/19/20 0528 06/20/20 0205 06/20/20 0606 06/20/20 1446 06/20/20 2137 06/21/20 0231 06/21/20 0925 06/21/20 1316  NA 120*   < > 121*  122*   < > 127*   < > 125*   < > 126* 125* 125* 127* 127*  K 4.2  --  4.0  --  3.7  --  3.7  --   --   --   --  3.6  --   CL 80*  --  80*  --  84*  --  81*  --   --   --   --  85*  --   CO2 34*  --  35*  --   37*  --  35*  --   --   --   --  36*  --   GLUCOSE 116*  --  129*  --  88  --  91  --   --   --   --  121*  --   BUN 15  --  51*  --  48*  --  88*  --   --   --   --  96*  --   CREATININE 0.98  --  1.07  --  1.03  --  1.10  --   --   --   --  1.13  --   CALCIUM 8.1*  --  9.0  --  9.2  --  9.5  --   --   --   --  9.9  --   MG  --   --   --   --   --   --   --   --  2.1  --   --   --   --   PHOS  --   --  2.0*  --  1.8*  --  2.1*  --   --   --   --   --   --    < > = values in this interval not displayed.    GFR Estimated Creatinine Clearance: 40.6 mL/min (by C-G formula based on SCr of 1.13 mg/dL). Liver Function Tests: Recent Labs  Lab 06/15/20 1011 06/17/20 0225 06/18/20 1027 06/19/20 0249 06/20/20 0205 06/21/20 0925  AST 22 21  --   --   --  25  ALT 16 15  --   --   --  18  ALKPHOS 51 55  --   --   --  61  BILITOT 0.7 0.7  --   --   --  1.2  PROT 4.8* 4.9*  --   --   --  5.4*  ALBUMIN 2.8* 2.9* 3.0* 3.2* 3.5 3.5    No results for input(s): LIPASE, AMYLASE in the last 168 hours. No results for input(s): AMMONIA in the last 168 hours. Coagulation profile No results for input(s): INR, PROTIME in the last 168 hours. COVID-19 Labs  Recent Labs    06/21/20 0925  FERRITIN 322     Lab Results  Component Value Date   [place] NEGATIVE 06/14/2020   [place] NEGATIVE 06/09/2020   SARSCOV2NAA  NEGATIVE 06/05/2020   [place] NEGATIVE 04/08/2020    CBC: Recent Labs  Lab 06/14/20 2332 06/19/20 0249 06/20/20 0606 06/21/20 0925 06/21/20 1316  WBC 9.5 7.3 11.4* 20.1* 21.1*  NEUTROABS  --   --  9.6* 19.0*  --   HGB 8.6* 7.1* 7.0* 9.2* 9.1*  HCT 26.5* 21.6* 21.9* 28.0* 28.6*  MCV 93.6 92.7 95.2 93.3 95.3  PLT 298 225 228 243 244    Cardiac Enzymes: No results for input(s): CKTOTAL, CKMB, CKMBINDEX, TROPONINI in the last 168 hours. BNP (last 3 results) No results for input(s): PROBNP in the last 8760 hours. CBG: No results for input(s): GLUCAP in the last  168 hours. D-Dimer: No results for input(s): DDIMER in the last 72 hours. Hgb A1c: No results for input(s): HGBA1C in the last 72 hours. Lipid Profile: No results for input(s): CHOL, HDL, LDLCALC, TRIG, CHOLHDL, LDLDIRECT in the last 72 hours. Thyroid function studies: No results for input(s): TSH, T4TOTAL, T3FREE, THYROIDAB in the last 72 hours.  Invalid input(s): FREET3  Anemia work up: Recent Labs    06/21/20 0925  FERRITIN 322  TIBC 326  IRON 69    Sepsis Labs: Recent Labs  Lab 06/19/20 0249 06/20/20 0606 06/21/20 0925 06/21/20 1316  WBC 7.3 11.4* 20.1* 21.1*    Microbiology Recent Results (from the past 240 hour(s))  SARS CORONAVIRUS 2 (TAT 6-24 HRS) Nasopharyngeal Nasopharyngeal Swab     Status: None   Collection Time: 06/14/20  9:50 PM   Specimen: Nasopharyngeal Swab  Result Value Ref Range Status   SARS Coronavirus 2 NEGATIVE NEGATIVE Final    Comment: (NOTE) SARS-CoV-2 target nucleic acids are NOT DETECTED.  The SARS-CoV-2 RNA is generally detectable in upper and lower respiratory specimens during the acute phase of infection. Negative results do not preclude SARS-CoV-2 infection, do not rule out co-infections with other pathogens, and should not be used as the sole basis for treatment or other patient management decisions. Negative results must be combined with clinical observations, patient history, and epidemiological information. The expected result is Negative.  Fact Sheet for Patients: SugarRoll.be  Fact Sheet for Healthcare Providers: https://www.woods-mathews.com/  This test is not yet approved or cleared by the [place] FDA and  has been authorized for detection and/or diagnosis of SARS-CoV-2 by FDA under an Emergency Use Authorization (EUA). This EUA will remain  in effect (meaning this test can be used) for the duration of the COVID-19 declaration under Se ction 564(b)(1) of the Act, 21  U.S.C. section 360bbb-3(b)(1), unless the authorization is terminated or revoked sooner.  Performed at [facility], [place] [address], [place], [place] [address]      Medications:    sodium chloride   Intravenous Once   ALPRAZolam  0.5 mg Oral QHS   aspirin  325 mg Oral Daily   atorvastatin  40 mg Oral QHS   budesonide-formoterol  2 puff Inhalation BID   calcium carbonate  1 tablet Oral TID   cholecalciferol  2,000 Units Oral Q lunch   diphenhydrAMINE  25 mg Oral Once   docusate sodium  100 mg Oral BID   feeding supplement  1 Container Oral TID BM   ferrous sulfate  325 mg Oral Q breakfast   finasteride  5 mg Oral Q lunch   FLUoxetine  10 mg Oral Daily   fluticasone  1-2 spray Each Nare Daily   Gerhardt's butt cream   Topical BID   guaiFENesin  600 mg Oral BID  levothyroxine  50 mcg Oral Q0600   metoprolol tartrate  12.5 mg Oral BID   multivitamin  1 tablet Oral Daily   mupirocin ointment   Topical Daily   nitroGLYCERIN  0.5 inch Topical Q6H   pantoprazole  40 mg Oral Daily   polyethylene glycol  17 g Oral Daily   predniSONE  5 mg Oral Q breakfast   sodium chloride  1 drop Both Eyes QID   sodium chloride flush  3 mL Intravenous Q12H   Polyethyl Glycol-Propyl Glycol  1 drop Both Eyes TID   Tiotropium Bromide Monohydrate  2 puff Inhalation Q0600   urea  30 g Oral BID   vitamin B-12  1,000 mcg Oral Daily   Continuous Infusions:  sodium chloride     heparin 700 Units/hr (06/21/20 1623)       LOS: 7 days   Jai-Gurmukh Daymion Nazaire  [facility]  06/21/2020, 4:34 PM

## 2020-06-21 NOTE — TOC Progression Note (Signed)
Transition of Care [facility]) - Progression Note    Patient Details  Name: [name] MRN: [id number] Date of Birth: 1929-02-13  Transition of Care [facility]) CM/SW Contact  Zenon Mayo, RN Phone Number: 06/21/2020, 12:06 PM  Clinical Narrative:    Palliative met with patient wife this am, will monitor patient for 24 to 48 hrs for stabilization see palliative note.  TOC will continue to follow.     Barriers to Discharge: Continued Medical Work up  Expected Discharge Plan and Services                             DME Agency: NA       HH Arranged: NA           Social Determinants of Health (SDOH) Interventions    Readmission Risk Interventions No flowsheet data found.

## 2020-06-21 NOTE — Progress Notes (Signed)
Blood transfusion started. Pt refused pre-transfusion benadryl.

## 2020-06-21 NOTE — Plan of Care (Signed)

## 2020-06-21 NOTE — Progress Notes (Signed)
[facility] Progress Note   Assessment/ Plan:   Hyponatremia, hypervolemic - Urine Na < 10 so likely intravascularly depleted from 3rd spacing.  Will try albumin and Lasix again.  Continue UreNa.   Acute on chronic CHF - as above will try albumin and IV lasix  Hypothyroidism - severely elevated TSH and agree with starting replacement therapy. Chronic foley h/o bladder cancer - cont with foley. Chronic respiratory failure secondary to COPD (Stage IV) on chronic oxygen. PAD s/p RAKA Sacral decubiti P. Atrial fib - per primary. Dispo: appreciate palliative care.  He appears to be approaching end of life.  I don't have a lot to offer him- wife and dtr want to make him more comfortable with his swelling so I will follow peripherally and help guide that where I can.  I agree with hospice at any time.  Subjective:    Seen in room.  Dtr and wife at bedside.  He appears uncomfortable with some labored breathing.  Appreciate palliative care discussions   Objective:   BP (!) 95/55   Pulse 90   Temp 98.3 F (36.8 C) (Oral)   Resp 18   Ht 5\' 8"  (1.727 m)   Wt 63.5 kg   SpO2 96%   BMI 21.29 kg/m   Physical Exam: Gen: frail and elderly CVS: RRR Resp: wearing O2, wheezy Abd: soft Ext: s/p R AKA, L leg with + 2+ edema and overall anasarca  Labs: BMET Recent Labs  Lab 06/16/20 0257 06/17/20 0225 06/17/20 1506 06/17/20 1813 06/18/20 1027 06/18/20 1819 06/19/20 0249 06/19/20 0528 06/20/20 0205 06/20/20 0606 06/20/20 0954 06/20/20 1446 06/20/20 2137 06/21/20 0231 06/21/20 0925  NA 122* 122* 120*   < > 121*  122*   < > 127*   < > 125* 127* 127* 126* 125* 125* 127*  K 4.1 4.2 4.2  --  4.0  --  3.7  --  3.7  --   --   --   --   --  3.6  CL 82* 79* 80*  --  80*  --  84*  --  81*  --   --   --   --   --  85*  CO2 33* 35* 34*  --  35*  --  37*  --  35*  --   --   --   --   --  36*  GLUCOSE 77 81 116*  --  129*  --  88  --  91  --   --   --   --   --  121*  BUN 16  14 15   --  51*  --  48*  --  88*  --   --   --   --   --  96*  CREATININE 1.02 1.04 0.98  --  1.07  --  1.03  --  1.10  --   --   --   --   --  1.13  CALCIUM 8.6* 8.5* 8.1*  --  9.0  --  9.2  --  9.5  --   --   --   --   --  9.9  PHOS  --   --   --   --  2.0*  --  1.8*  --  2.1*  --   --   --   --   --   --    < > = values in this interval not displayed.   CBC  Recent Labs  Lab 06/14/20 2332 06/19/20 0249 06/20/20 0606 06/21/20 0925  WBC 9.5 7.3 11.4* 20.1*  NEUTROABS  --   --  9.6* 19.0*  HGB 8.6* 7.1* 7.0* 9.2*  HCT 26.5* 21.6* 21.9* 28.0*  MCV 93.6 92.7 95.2 93.3  PLT 298 225 228 243      Medications:     sodium chloride   Intravenous Once   ALPRAZolam  0.5 mg Oral QHS   aspirin  325 mg Oral Daily   atorvastatin  40 mg Oral QHS   budesonide-formoterol  2 puff Inhalation BID   calcium carbonate  1 tablet Oral TID   cholecalciferol  2,000 Units Oral Q lunch   diphenhydrAMINE  25 mg Oral Once   docusate sodium  100 mg Oral BID   feeding supplement  1 Container Oral TID BM   ferrous sulfate  325 mg Oral Q breakfast   finasteride  5 mg Oral Q lunch   FLUoxetine  10 mg Oral Daily   fluticasone  1-2 spray Each Nare Daily   Gerhardt's butt cream   Topical BID   guaiFENesin  600 mg Oral BID   levothyroxine  50 mcg Oral Q0600   metoprolol tartrate  12.5 mg Oral BID   multivitamin  1 tablet Oral Daily   mupirocin ointment   Topical Daily   nitroGLYCERIN  0.5 inch Topical Q6H   pantoprazole  40 mg Oral Daily   polyethylene glycol  17 g Oral Daily   predniSONE  5 mg Oral Q breakfast   sodium chloride  1 drop Both Eyes QID   sodium chloride flush  3 mL Intravenous Q12H   Polyethyl Glycol-Propyl Glycol  1 drop Both Eyes TID   Tiotropium Bromide Monohydrate  2 puff Inhalation Q0600   urea  30 g Oral BID   vitamin B-12  1,000 mcg Oral Daily     Madelon Lips MD 06/21/2020, 2:28 PM

## 2020-06-22 LAB — BPAM RBC
Blood Product Expiration Date: 202206232359
ISSUE DATE / TIME: 202206130356
Unit Type and Rh: 7300

## 2020-06-22 LAB — TYPE AND SCREEN
ABO/RH(D): B POS
Antibody Screen: NEGATIVE
Unit division: 0

## 2020-06-22 LAB — GLUCOSE, CAPILLARY: Glucose-Capillary: 198 mg/dL — ABNORMAL HIGH (ref 70–99)

## 2020-06-22 MED ORDER — ATROPINE SULFATE 1 MG/10ML IJ SOSY
PREFILLED_SYRINGE | INTRAMUSCULAR | Status: AC
  Filled 2020-06-22: qty 10

## 2020-06-22 MED ORDER — ATROPINE SULFATE 1 MG/10ML IJ SOSY
1.0000 mg | PREFILLED_SYRINGE | Freq: Once | INTRAMUSCULAR | Status: AC
  Administered 2020-06-22: 1 mg via INTRAVENOUS

## 2020-06-22 MED ORDER — ATROPINE SULFATE 1 MG/10ML IJ SOSY: 1.0000 mg | PREFILLED_SYRINGE | Freq: Once | INTRAMUSCULAR | Status: AC

## 2020-06-22 MED ORDER — ATROPINE SULFATE 1 MG/ML IJ SOLN: 1.0000 mg | Freq: Once | INTRAMUSCULAR | Status: DC

## 2020-06-22 MED ORDER — SODIUM CHLORIDE 0.9 % IV BOLUS
1000.0000 mL | Freq: Once | INTRAVENOUS | Status: AC
  Administered 2020-06-22: 1000 mL via INTRAVENOUS

## 2020-06-22 MED ORDER — ATROPINE SULFATE 1 MG/10ML IJ SOSY
PREFILLED_SYRINGE | INTRAMUSCULAR | Status: AC
  Administered 2020-06-22: 1 mg
  Filled 2020-06-22: qty 10

## 2020-06-29 ENCOUNTER — Telehealth: Payer: Self-pay | Admitting: Internal Medicine

## 2020-06-29 NOTE — Telephone Encounter (Signed)
I called and spoke with [name], gave condolences. I advised that we can not accept even unused meds. She could try calling pharmacy and see if they have any ideas, or call the [employer] [place] and see if they are currently taking donations. I provided her with their phone number. Condolence card will be mailed.

## 2020-06-29 NOTE — Telephone Encounter (Signed)
Routing to Leslie

## 2020-07-01 ENCOUNTER — Ambulatory Visit: Payer: Medicare Other | Admitting: Family

## 2020-07-09 NOTE — Discharge Summary (Signed)
Death Summary  LAVERE STORK IWP:[id number] DOB: 04/11/1929 DOA: 07/02/20  PCP: [name], MD  Admit date: 07/02/2020 Date of Death: 2020/07/13 Time of Death: 0115 Notification: [name], MD notified of death of 2020/07/13   History of present illness:  RODRECUS BELSKY is a 87 y.o. male with a history of  CAD and prior CABG with chronic respiratory failure on 2 L of oxygen secondary to stage IV COPD, paroxysmal A. fib CHADS2 score >4 however not on blood thinners secondary to elevated has bled score Prior bladder cancer and chronic Foley was recently hospitalized 5/28 through 6/2 with an NSTEMI and patient was medically  managed at that time  He was discharged to the skilled nursing facility which could not manage his trilogy vent and eventually went home  he developed more shortness of breath bilateral lower extremity edema and inability to transfer He was brought to the emergency room found to be short of breath weak and significantly deconditioned  Subsequently he was also found to have significant and severe hyponatremia which eventually was thought to be secondary to primary hypothyroidism which was new this admission--nephrology was consulted for help with management of the same  On 6/13 he developed worsening chest pain-- he was found to have a recurrent NSTEMI with troponins in the 200s  Patient's case was discussed with cardiology,-I discussed the case with Dr. Shelton Silvas who agreed that patient was very poor interventional candidate and that goal-directed therapy was the only option he had all the patient was hypotensive  Palliative care was involved in discussions and patient was made no CODE BLUE  Patient became severely bradycardic on the night of 6/14-had detailed discussions throughout hospital stay with his family including his son who is a physician and patient declined and ultimately passed at 1:15 AM 07/13/2020    Final Diagnoses:  1.   NSTEMI Severe  hyponatremia secondary to hypothyroid   The results of significant diagnostics from this hospitalization (including imaging, microbiology, ancillary and laboratory) are listed below for reference.    Significant Diagnostic Studies: DG Chest 2 View  Result Date: 06/05/2020 CLINICAL DATA:  Chest pain. EXAM: CHEST - 2 VIEW COMPARISON:  02/08/2020 and older studies. FINDINGS: Stable changes from prior CABG surgery. Cardiac silhouette is normal in size and configuration. No mediastinal or hilar masses. No adenopathy. Lungs are hyperexpanded. Mild interstitial prominence most evident at the bases. Lungs otherwise clear. No pleural effusion or pneumothorax. Skeletal structures are demineralized, but intact. IMPRESSION: 1. No acute cardiopulmonary disease. 2. COPD. Electronically Signed   By: Lajean Manes M.D.   On: 06/05/2020 17:29   DG Chest Port 1 View  Result Date: 06/14/2020 CLINICAL DATA:  Shortness of breath. EXAM: PORTABLE CHEST 1 VIEW COMPARISON:  Jun 05, 2020. FINDINGS: The heart size and mediastinal contours are within normal limits. Status post coronary bypass graft. No pneumothorax is noted. Minimal bibasilar subsegmental atelectasis is noted with small left pleural effusion. The visualized skeletal structures are unremarkable. IMPRESSION: Minimal bibasilar subsegmental atelectasis is noted with small left pleural effusion. Aortic Atherosclerosis (ICD10-I70.0). Electronically Signed   By: Marijo Conception M.D.   On: 06/14/2020 16:26   ECHOCARDIOGRAM COMPLETE  Result Date: 06/06/2020    ECHOCARDIOGRAM REPORT   Patient Name:   SAMIK BALKCOM [facility] Date of Exam: 06/06/2020 Medical Rec #:  [id number]    Height:       69.0 in Accession #:    [id number]   Weight:  121.9 lb Date of Birth:  1929-09-21    BSA:          1.674 m Patient Age:    62 years     BP:           100/65 mmHg Patient Gender: M            HR:           69 bpm. Exam Location:  Inpatient Procedure: 2D Echo, Cardiac Doppler and Color Doppler  Indications:    R07.9* Chest pain, unspecified  History:        Patient has prior history of Echocardiogram examinations, most                 recent 01/28/2020. CHF, Previous Myocardial Infarction and CAD,                 Abnormal ECG, COPD, Arrythmias:Atrial Fibrillation and Atrial                 Flutter, Signs/Symptoms:Shortness of Breath and Dyspnea; Risk                 Factors:Hypertension and Dyslipidemia. Cor pulmonale.  Sonographer:    Roseanna Rainbow RDCS Referring Phys: [id number] Rhetta Mura  Sonographer Comments: Technically difficult study due to poor echo windows, suboptimal parasternal window and suboptimal apical window. Extremely difficult windows. IMPRESSIONS  1. Left ventricular ejection fraction, by estimation, is 60 to 65%. The left ventricle has normal function. The left ventricle has no regional wall motion abnormalities. Left ventricular diastolic parameters were normal.  2. Right ventricular systolic function is low normal. The right ventricular size is mildly enlarged. There is moderately elevated pulmonary artery systolic pressure.  3. The mitral valve is degenerative. Mild mitral valve regurgitation. Moderate mitral annular calcification.  4. Tricuspid valve regurgitation is moderate.  5. The aortic valve is grossly normal. There is mild calcification of the aortic valve. There is mild thickening of the aortic valve. Aortic valve regurgitation is not visualized. Mild aortic valve sclerosis is present, with no evidence of aortic valve stenosis. Comparison(s): No significant change from prior study. FINDINGS  Left Ventricle: Left ventricular ejection fraction, by estimation, is 60 to 65%. The left ventricle has normal function. The left ventricle has no regional wall motion abnormalities. The left ventricular internal cavity size was normal in size. There is  no left ventricular hypertrophy. Abnormal (paradoxical) septal motion consistent with post-operative status. Left ventricular diastolic  parameters were normal. Right Ventricle: The right ventricular size is mildly enlarged. Right vetricular wall thickness was not well visualized. Right ventricular systolic function is low normal. There is moderately elevated pulmonary artery systolic pressure. The tricuspid regurgitant velocity is 3.01 m/s, and with an assumed right atrial pressure of 15 mmHg, the estimated right ventricular systolic pressure is 62.1 mmHg. Left Atrium: Left atrial size was normal in size. Right Atrium: Right atrial size was normal in size. Pericardium: Trivial pericardial effusion is present. Mitral Valve: The mitral valve is degenerative in appearance. There is mild thickening of the mitral valve leaflet(s). There is mild calcification of the mitral valve leaflet(s). Moderate mitral annular calcification. Mild mitral valve regurgitation. Tricuspid Valve: The tricuspid valve is normal in structure. Tricuspid valve regurgitation is moderate. Aortic Valve: The aortic valve is grossly normal. There is mild calcification of the aortic valve. There is mild thickening of the aortic valve. Aortic valve regurgitation is not visualized. Mild aortic valve sclerosis is present, with no evidence of aortic  valve stenosis. Pulmonic Valve: The pulmonic valve was not well visualized. Pulmonic valve regurgitation is not visualized. Aorta: The aortic root, ascending aorta and aortic arch are all structurally normal, with no evidence of dilitation or obstruction. IAS/Shunts: The atrial septum is grossly normal.  LEFT VENTRICLE PLAX 2D LVIDd:         3.60 cm     Diastology LVIDs:         2.50 cm     LV e' medial:    7.65 cm/s LV PW:         1.10 cm     LV E/e' medial:  13.5 LV IVS:        1.10 cm     LV e' lateral:   8.93 cm/s LVOT diam:     2.30 cm     LV E/e' lateral: 11.5 LV SV:         57 LV SV Index:   34 LVOT Area:     4.15 cm  LV Volumes (MOD) LV vol d, MOD A2C: 36.7 ml LV vol d, MOD A4C: 64.4 ml LV vol s, MOD A2C: 18.9 ml LV vol s, MOD A4C:  24.3 ml LV SV MOD A2C:     17.8 ml LV SV MOD A4C:     64.4 ml LV SV MOD BP:      28.4 ml RIGHT VENTRICLE RV S prime:     7.73 cm/s TAPSE (M-mode): 1.4 cm LEFT ATRIUM           Index       RIGHT ATRIUM           Index LA diam:      3.40 cm 2.03 cm/m  RA Area:     13.10 cm LA Vol (A2C): 19.1 ml 11.41 ml/m RA Volume:   33.90 ml  20.25 ml/m LA Vol (A4C): 19.9 ml 11.89 ml/m  AORTIC VALVE LVOT Vmax:   70.30 cm/s LVOT Vmean:  44.100 cm/s LVOT VTI:    0.138 m  AORTA Ao Root diam: 3.70 cm Ao Asc diam:  3.50 cm MITRAL VALVE                TRICUSPID VALVE MV Area (PHT): 5.27 cm     TR Peak grad:   36.2 mmHg MV Decel Time: 144 msec     TR Vmax:        301.00 cm/s MV E velocity: 103.00 cm/s MV A velocity: 64.00 cm/s   SHUNTS MV E/A ratio:  1.61         Systemic VTI:  0.14 m                             Systemic Diam: 2.30 cm Buford Dresser MD Electronically signed by Buford Dresser MD Signature Date/Time: 06/06/2020/10:57:09 AM    Final     Microbiology: Recent Results (from the past 240 hour(s))  SARS CORONAVIRUS 2 (TAT 6-24 HRS) Nasopharyngeal Nasopharyngeal Swab     Status: None   Collection Time: 06/14/20  9:50 PM   Specimen: Nasopharyngeal Swab  Result Value Ref Range Status   SARS Coronavirus 2 NEGATIVE NEGATIVE Final    Comment: (NOTE) SARS-CoV-2 target nucleic acids are NOT DETECTED.  The SARS-CoV-2 RNA is generally detectable in upper and lower respiratory specimens during the acute phase of infection. Negative results do not preclude SARS-CoV-2 infection, do not rule out co-infections with other pathogens, and should not be  used as the sole basis for treatment or other patient management decisions. Negative results must be combined with clinical observations, patient history, and epidemiological information. The expected result is Negative.  Fact Sheet for Patients: SugarRoll.be  Fact Sheet for Healthcare  Providers: https://www.woods-mathews.com/  This test is not yet approved or cleared by the [place] FDA and  has been authorized for detection and/or diagnosis of SARS-CoV-2 by FDA under an Emergency Use Authorization (EUA). This EUA will remain  in effect (meaning this test can be used) for the duration of the COVID-19 declaration under Se ction 564(b)(1) of the Act, 21 U.S.C. section 360bbb-3(b)(1), unless the authorization is terminated or revoked sooner.  Performed at [facility], [place] [address] [place],  [address]      Labs: Basic Metabolic Panel: Recent Labs  Lab 06/17/20 1506 06/17/20 1813 06/18/20 1027 06/18/20 1819 06/19/20 0249 06/19/20 0528 06/20/20 0205 06/20/20 0606 06/20/20 1446 06/20/20 2137 06/21/20 0231 06/21/20 0925 06/21/20 1316  NA 120*   < > 121*  122*   < > 127*   < > 125*   < > 126* 125* 125* 127* 127*  K 4.2  --  4.0  --  3.7  --  3.7  --   --   --   --  3.6  --   CL 80*  --  80*  --  84*  --  81*  --   --   --   --  85*  --   CO2 34*  --  35*  --  37*  --  35*  --   --   --   --  36*  --   GLUCOSE 116*  --  129*  --  88  --  91  --   --   --   --  121*  --   BUN 15  --  51*  --  48*  --  88*  --   --   --   --  96*  --   CREATININE 0.98  --  1.07  --  1.03  --  1.10  --   --   --   --  1.13  --   CALCIUM 8.1*  --  9.0  --  9.2  --  9.5  --   --   --   --  9.9  --   MG  --   --   --   --   --   --   --   --  2.1  --   --   --   --   PHOS  --   --  2.0*  --  1.8*  --  2.1*  --   --   --   --   --   --    < > = values in this interval not displayed.   Liver Function Tests: Recent Labs  Lab 06/15/20 1011 06/17/20 0225 06/18/20 1027 06/19/20 0249 06/20/20 0205 06/21/20 0925  AST 22 21  --   --   --  25  ALT 16 15  --   --   --  18  ALKPHOS 51 55  --   --   --  61  BILITOT 0.7 0.7  --   --   --  1.2  PROT 4.8* 4.9*  --   --   --  5.4*  ALBUMIN 2.8* 2.9* 3.0* 3.2* 3.5 3.5  No results for input(s): LIPASE,  AMYLASE in the last 168 hours. No results for input(s): AMMONIA in the last 168 hours. CBC: Recent Labs  Lab 06/19/20 0249 06/20/20 0606 06/21/20 0925 06/21/20 1316  WBC 7.3 11.4* 20.1* 21.1*  NEUTROABS  --  9.6* 19.0*  --   HGB 7.1* 7.0* 9.2* 9.1*  HCT 21.6* 21.9* 28.0* 28.6*  MCV 92.7 95.2 93.3 95.3  PLT 225 228 243 244   Cardiac Enzymes: No results for input(s): CKTOTAL, CKMB, CKMBINDEX, TROPONINI in the last 168 hours. D-Dimer No results for input(s): DDIMER in the last 72 hours. BNP: Invalid input(s): POCBNP CBG: Recent Labs  Lab July 04, 2020 0033  GLUCAP 198*   Anemia work up Recent Labs    06/21/20 0925  FERRITIN 322  TIBC 326  IRON 69   Urinalysis    Component Value Date/Time   COLORURINE YELLOW 06/05/2020 1925   APPEARANCEUR HAZY (A) 06/05/2020 1925   LABSPEC 1.014 06/05/2020 1925   PHURINE 5.0 06/05/2020 1925   GLUCOSEU NEGATIVE 06/05/2020 1925   HGBUR NEGATIVE 06/05/2020 1925   BILIRUBINUR NEGATIVE 06/05/2020 1925   KETONESUR NEGATIVE 06/05/2020 1925   PROTEINUR NEGATIVE 06/05/2020 1925   NITRITE NEGATIVE 06/05/2020 1925   LEUKOCYTESUR LARGE (A) 06/05/2020 1925   Sepsis Labs Invalid input(s): PROCALCITONIN,  WBC,  LACTICIDVEN     SIGNED:  Nita Sells, MD  [facility] 07/04/20, 8:40 AM Pager   If 7PM-7AM, please contact night-coverage [email or web address] Password [email or web address]

## 2020-07-09 NOTE — Progress Notes (Signed)
Pt heart rhythm agonal with no heart sounds, no pulse, and no respirations x 2 minutes. Witnessed by Dr. Marlowe Sax. [name], son, and daughter at bedside. Support given and questions answered.

## 2020-07-09 NOTE — Significant Event (Signed)
Rapid Response Event Note   Reason for Call :  Bradycardia-40s, hypotension-50s, and unresponsiveness. Pt is DNR.  Pt's son at bedside requesting any reversible causes within scope of pt's DNR status be addressed. Initial Focused Assessment:  Pt lying in bed with eyes closed, unresponsive to deep painful stimulation. Heart tones heard distantly and pulse palpable- though weak and very slow. Skin cold to touch.   HR-28, BP-50/23>31/18, RR-7, SpO2-79% on home trilogy machine, CBG-198.  Dr. Marlowe Sax paged PTA RRT and ordered 1mg  atropine and 1L NS bolus. Both orders initiated. HR did not respond to these interventions and rhythm became IV/agonal. Additional 1mg  atropine ordered and given with no change in pt's HR. Once wife and dtr arrived at bedside, decision made to remove pt's trilogy machine. Machine removed-pt with no respirations. Family given time with pt. At 0115-pt death pronounced-witnessed by Dr. Marlowe Sax.   Interventions:  CBG-198 1L NS bolus 1mg  atropine x 2  Plan of Care:  Pt [id number]   Event Summary:   MD Notified: Dr. Marlowe Sax notified and came to bedside Call [place], Contrina Orona Anderson, RN

## 2020-07-09 NOTE — Progress Notes (Signed)
Overnight floor coverage progress note  Called by bedside by RN as patient became severely bradycardic, hypotensive, and unresponsive even to deep painful stimulation.  His heart rate was in the 20s, blood pressure 31/18, respiratory rate 7,  satting in the 70s on home trilogy machine.  CBG 198.  Very distant heart sounds appreciated on auscultation.  Femoral pulse initially palpable but very weak.  Extremities cool to touch.  Son at bedside is a physician who confirmed that the patient is DNR and did not want any heroic measures.  He understood that the patient was dying but requested interventions to keep the him alive just until his mother and sister arrived the hospital.  Patient was given atropine 1 mg x 2 and IV fluid resuscitation, however, he continued to decline.  After patient's wife and daughter arrived, decision was made to remove his trilogy machine.  He now had no respirations or audible breath sounds, no carotid or femoral pulse, and no audible heart sounds.  Time of death 1:15 AM.

## 2020-07-09 DEATH — deceased

## 2020-08-02 ENCOUNTER — Ambulatory Visit: Payer: Medicare Other | Admitting: Psychology

## 2020-09-30 ENCOUNTER — Ambulatory Visit: Payer: Medicare Other | Admitting: Psychology
